# Patient Record
Sex: Female | Born: 1965 | Race: White | Hispanic: No | Marital: Single | Smoking: Never smoker
Health system: Southern US, Community
[De-identification: ages and names within clinical notes are randomized; demographics above are authoritative.]

## PROBLEM LIST (undated history)

## (undated) DIAGNOSIS — M7580 Other shoulder lesions, unspecified shoulder: Secondary | ICD-10-CM

## (undated) DIAGNOSIS — E039 Hypothyroidism, unspecified: Secondary | ICD-10-CM

## (undated) DIAGNOSIS — Z8719 Personal history of other diseases of the digestive system: Secondary | ICD-10-CM

## (undated) DIAGNOSIS — F329 Major depressive disorder, single episode, unspecified: Secondary | ICD-10-CM

## (undated) DIAGNOSIS — F419 Anxiety disorder, unspecified: Secondary | ICD-10-CM

## (undated) DIAGNOSIS — D7582 Heparin induced thrombocytopenia (HIT): Secondary | ICD-10-CM

## (undated) DIAGNOSIS — T4145XA Adverse effect of unspecified anesthetic, initial encounter: Secondary | ICD-10-CM

## (undated) DIAGNOSIS — G8929 Other chronic pain: Secondary | ICD-10-CM

## (undated) DIAGNOSIS — Z8489 Family history of other specified conditions: Secondary | ICD-10-CM

## (undated) DIAGNOSIS — I2699 Other pulmonary embolism without acute cor pulmonale: Secondary | ICD-10-CM

## (undated) DIAGNOSIS — M1990 Unspecified osteoarthritis, unspecified site: Secondary | ICD-10-CM

## (undated) DIAGNOSIS — J189 Pneumonia, unspecified organism: Secondary | ICD-10-CM

## (undated) DIAGNOSIS — C801 Malignant (primary) neoplasm, unspecified: Secondary | ICD-10-CM

## (undated) DIAGNOSIS — G8220 Paraplegia, unspecified: Secondary | ICD-10-CM

## (undated) DIAGNOSIS — M549 Dorsalgia, unspecified: Secondary | ICD-10-CM

## (undated) DIAGNOSIS — Z9289 Personal history of other medical treatment: Secondary | ICD-10-CM

## (undated) DIAGNOSIS — D75829 Heparin-induced thrombocytopenia, unspecified: Secondary | ICD-10-CM

## (undated) DIAGNOSIS — F32A Depression, unspecified: Secondary | ICD-10-CM

## (undated) DIAGNOSIS — E669 Obesity, unspecified: Secondary | ICD-10-CM

## (undated) DIAGNOSIS — M5030 Other cervical disc degeneration, unspecified cervical region: Secondary | ICD-10-CM

## (undated) DIAGNOSIS — I10 Essential (primary) hypertension: Secondary | ICD-10-CM

## (undated) DIAGNOSIS — T8859XA Other complications of anesthesia, initial encounter: Secondary | ICD-10-CM

## (undated) DIAGNOSIS — K219 Gastro-esophageal reflux disease without esophagitis: Secondary | ICD-10-CM

## (undated) HISTORY — PX: TONSILLECTOMY: SUR1361

## (undated) HISTORY — DX: Malignant (primary) neoplasm, unspecified: C80.1

## (undated) HISTORY — DX: Paraplegia, unspecified: G82.20

---

## 2002-09-16 ENCOUNTER — Other Ambulatory Visit: Admission: RE | Admit: 2002-09-16 | Discharge: 2002-09-16 | Payer: Self-pay | Admitting: Obstetrics and Gynecology

## 2006-04-15 ENCOUNTER — Emergency Department (HOSPITAL_COMMUNITY): Admission: EM | Admit: 2006-04-15 | Discharge: 2006-04-15 | Payer: Self-pay | Admitting: Emergency Medicine

## 2006-04-20 ENCOUNTER — Ambulatory Visit: Payer: Self-pay | Admitting: Family Medicine

## 2006-05-04 ENCOUNTER — Ambulatory Visit: Payer: Self-pay | Admitting: Family Medicine

## 2009-05-15 ENCOUNTER — Emergency Department (HOSPITAL_BASED_OUTPATIENT_CLINIC_OR_DEPARTMENT_OTHER): Admission: EM | Admit: 2009-05-15 | Discharge: 2009-05-15 | Payer: Self-pay | Admitting: Emergency Medicine

## 2010-09-09 ENCOUNTER — Emergency Department (INDEPENDENT_AMBULATORY_CARE_PROVIDER_SITE_OTHER): Payer: Self-pay

## 2010-09-09 ENCOUNTER — Emergency Department (HOSPITAL_BASED_OUTPATIENT_CLINIC_OR_DEPARTMENT_OTHER)
Admission: EM | Admit: 2010-09-09 | Discharge: 2010-09-09 | Disposition: A | Discharge status: Home / Self Care | Payer: Self-pay | Attending: Emergency Medicine | Admitting: Emergency Medicine

## 2010-09-09 DIAGNOSIS — M546 Pain in thoracic spine: Secondary | ICD-10-CM

## 2010-09-09 DIAGNOSIS — M545 Low back pain, unspecified: Secondary | ICD-10-CM | POA: Insufficient documentation

## 2010-09-09 DIAGNOSIS — I10 Essential (primary) hypertension: Secondary | ICD-10-CM | POA: Insufficient documentation

## 2010-10-02 ENCOUNTER — Encounter: Payer: Self-pay | Admitting: *Deleted

## 2010-10-02 ENCOUNTER — Emergency Department (HOSPITAL_COMMUNITY): Payer: Self-pay

## 2010-10-02 ENCOUNTER — Emergency Department (HOSPITAL_COMMUNITY)
Admission: EM | Admit: 2010-10-02 | Discharge: 2010-10-02 | Disposition: A | Discharge status: Home / Self Care | Payer: Self-pay | Attending: Emergency Medicine | Admitting: Emergency Medicine

## 2010-10-02 DIAGNOSIS — S9000XA Contusion of unspecified ankle, initial encounter: Secondary | ICD-10-CM | POA: Insufficient documentation

## 2010-10-02 DIAGNOSIS — X500XXA Overexertion from strenuous movement or load, initial encounter: Secondary | ICD-10-CM | POA: Insufficient documentation

## 2010-10-02 DIAGNOSIS — I10 Essential (primary) hypertension: Secondary | ICD-10-CM | POA: Insufficient documentation

## 2010-10-02 DIAGNOSIS — Y9289 Other specified places as the place of occurrence of the external cause: Secondary | ICD-10-CM | POA: Insufficient documentation

## 2010-10-02 DIAGNOSIS — S93409A Sprain of unspecified ligament of unspecified ankle, initial encounter: Secondary | ICD-10-CM | POA: Insufficient documentation

## 2010-10-02 HISTORY — DX: Essential (primary) hypertension: I10

## 2010-10-02 NOTE — ED Notes (Signed)
Rt foot pain since yesterday. Pt states that she twisted her foot yesterday.

## 2010-10-02 NOTE — ED Provider Notes (Signed)
History     Chief Complaint  Patient presents with  . Ankle Pain   [name] a 45 y.o. female presenting with ankle pain. The history is provided by the patient. No language interpreter was used.  Ankle Pain  The incident occurred yesterday. The incident occurred at the pool. Injury mechanism: twisting. The pain is present in the right foot and right ankle. Quality: painful. The pain is moderate. The pain has been constant since onset. Pertinent negatives include no numbness, no loss of motion, no muscle weakness, no loss of sensation and no tingling. She reports no foreign bodies present. The symptoms are aggravated by bearing weight. She has tried nothing for the symptoms.  Patient reports twisting right ankle yesterday while at pool. C/o pain to medial aspect of right foot, anterior ankle and lateral ankle. Unable to walk on right foot without pain. Denies tingling/numbness in RLE. No other complaints.   Patient seen at  12:36 PM  Past Medical History  Diagnosis Date  . Hypertension     Past Surgical History  Procedure Date  . Tonsillectomy     Family History  Problem Relation Age of Onset  . Hypertension Mother   . Diabetes Mother   . Hypertension Father   . Stroke Father     History  Substance Use Topics  . Smoking status: Never Smoker   . Smokeless tobacco: Not on file  . Alcohol Use: No    OB History    Grav Para Term Preterm Abortions TAB SAB Ect Mult Living                  Review of Systems  Musculoskeletal:       Right ankle and foot pain.   Skin:       Bruising.  Neurological: Negative for tingling and numbness.  All other systems reviewed and are negative.    Physical Exam  BP 156/97  Pulse 94  Temp(Src) 98.9 F (37.2 C) (Oral)  Resp 20  Ht 5\' 11"  (1.803 m)  Wt 275 lb (124.739 kg)  BMI 38.35 kg/m2  SpO2 100%  LMP 09/23/2010  Physical Exam  Nursing note and vitals reviewed. Constitutional: She is oriented to person, place, and time.  She appears well-developed and well-nourished.       Hypertensive.  HENT:  Head: Normocephalic and atraumatic.  Eyes: Conjunctivae are normal.  Neck: Neck supple.  Cardiovascular: Normal rate, regular rhythm and normal pulses.   Pulmonary/Chest: Effort normal.  Musculoskeletal: Normal range of motion. She exhibits tenderness.       Slight ecchymosis to medial aspect of right foot. Tenderness to medial aspect of right foot, anterior and lateral right ankle. Distal neurovascular intact in RLE.   Neurological: She is alert and oriented to person, place, and time. No sensory deficit.  Skin: Skin is warm and dry.  Psychiatric: She has a normal mood and affect. Her behavior is normal.   ED Course  Procedures  MDM   I performed a history and physical examination of Sarah [name] and discussed her management with Dr. .  I agree with the history, physical, assessment, and plan of care, with the following exceptions: None  I was present for the following procedures: None Time Spent in Critical Care of the patient: None Time spent in discussions with the patient and family:   Sarah [name]  I personally performed the services described in this documentation, which was scribed in my presence. The recorded information has been reviewed  and considered.    Nat Christen, MD 10/02/10 1254

## 2010-12-09 ENCOUNTER — Encounter (HOSPITAL_BASED_OUTPATIENT_CLINIC_OR_DEPARTMENT_OTHER): Payer: Self-pay | Admitting: Family Medicine

## 2010-12-09 ENCOUNTER — Emergency Department (HOSPITAL_BASED_OUTPATIENT_CLINIC_OR_DEPARTMENT_OTHER)
Admission: EM | Admit: 2010-12-09 | Discharge: 2010-12-09 | Disposition: A | Discharge status: Home / Self Care | Payer: Self-pay | Attending: Emergency Medicine | Admitting: Emergency Medicine

## 2010-12-09 ENCOUNTER — Emergency Department (INDEPENDENT_AMBULATORY_CARE_PROVIDER_SITE_OTHER): Payer: Self-pay

## 2010-12-09 DIAGNOSIS — M7730 Calcaneal spur, unspecified foot: Secondary | ICD-10-CM

## 2010-12-09 DIAGNOSIS — IMO0002 Reserved for concepts with insufficient information to code with codable children: Secondary | ICD-10-CM

## 2010-12-09 DIAGNOSIS — R609 Edema, unspecified: Secondary | ICD-10-CM

## 2010-12-09 DIAGNOSIS — M76899 Other specified enthesopathies of unspecified lower limb, excluding foot: Secondary | ICD-10-CM

## 2010-12-09 DIAGNOSIS — S93409A Sprain of unspecified ligament of unspecified ankle, initial encounter: Secondary | ICD-10-CM

## 2010-12-09 DIAGNOSIS — S93699A Other sprain of unspecified foot, initial encounter: Secondary | ICD-10-CM | POA: Insufficient documentation

## 2010-12-09 DIAGNOSIS — M25579 Pain in unspecified ankle and joints of unspecified foot: Secondary | ICD-10-CM

## 2010-12-09 DIAGNOSIS — X58XXXA Exposure to other specified factors, initial encounter: Secondary | ICD-10-CM

## 2010-12-09 DIAGNOSIS — X500XXA Overexertion from strenuous movement or load, initial encounter: Secondary | ICD-10-CM | POA: Insufficient documentation

## 2010-12-09 DIAGNOSIS — M779 Enthesopathy, unspecified: Secondary | ICD-10-CM

## 2010-12-09 DIAGNOSIS — I10 Essential (primary) hypertension: Secondary | ICD-10-CM | POA: Insufficient documentation

## 2010-12-09 MED ORDER — HYDROCODONE-ACETAMINOPHEN 5-500 MG PO TABS: 1.0000 | ORAL_TABLET | Freq: Four times a day (QID) | ORAL | Status: AC | PRN

## 2010-12-09 NOTE — ED Provider Notes (Signed)
History     CSN: [id number] Arrival date & time: 12/09/2010  3:16 PM   Chief Complaint  Patient presents with  . Ankle Injury     (Include location/radiation/quality/duration/timing/severity/associated sxs/prior treatment) [name] a 45 y.o. female presenting with foot injury. The history is provided by the patient. No language interpreter was used.  Foot Injury  The incident occurred 2 days ago. [place] The injury mechanism was torsion. The pain is present in the left ankle and left foot. The quality of the pain is described as aching. The pain is moderate. The pain has been constant since onset. Pertinent negatives include no numbness, no inability to bear weight, no loss of motion, no loss of sensation and no tingling. She reports no foreign bodies present. The symptoms are aggravated by bearing weight and activity. She has tried NSAIDs for the symptoms. The treatment provided mild relief.     Past Medical History  Diagnosis Date  . Hypertension      Past Surgical History  Procedure Date  . Tonsillectomy     Family History  Problem Relation Age of Onset  . Hypertension Mother   . Diabetes Mother   . Hypertension Father   . Stroke Father     History  Substance Use Topics  . Smoking status: Never Smoker   . Smokeless tobacco: Not on file  . Alcohol Use: No    OB History    Grav Para Term Preterm Abortions TAB SAB Ect Mult Living                  Review of Systems  Neurological: Negative for tingling and numbness.  All other systems reviewed and are negative.    Allergies  Review of patient's allergies indicates no known allergies.  Home Medications   Current Outpatient Rx  Name Route Sig Dispense Refill  . HYDROCHLOROTHIAZIDE 25 MG PO TABS Oral Take 25 mg by mouth daily.      . IBUPROFEN 200 MG PO TABS Oral Take 400 mg by mouth every 6 (six) hours as needed. pain       Physical Exam    BP 152/91  Pulse 94  Temp(Src)  97.8 F (36.6 C) (Oral)  Resp 16  Ht 5\' 10"  (1.778 m)  Wt 275 lb (124.739 kg)  BMI 39.46 kg/m2  SpO2 100%  LMP 10/23/2010  Physical Exam  Nursing note and vitals reviewed. Constitutional: She is oriented to person, place, and time. She appears well-developed and well-nourished.  HENT:  Head: Normocephalic and atraumatic.  Eyes: Pupils are equal, round, and reactive to light.  Cardiovascular: Normal rate and regular rhythm.   Pulmonary/Chest: Effort normal and breath sounds normal.  Musculoskeletal:       Swelling noted to the lateral aspect of the left foot and ankle:pt has full rom  Neurological: She is alert and oriented to person, place, and time. Coordination normal.  Skin: Skin is warm and dry.    ED Course  Procedures  No results found for this or any previous visit. Dg Ankle Complete Left  12/09/2010  *RADIOLOGY REPORT*  Clinical Data: Injured left ankle.  LEFT ANKLE COMPLETE - 3+ VIEW  Comparison: None  Findings: The ankle mortise is maintained.  No acute fracture or osteochondral abnormality.  Multiple densities project off the distal aspects of the tibia and fibula which are likely related to previous injury or enthesopathic changes.  There is a large calcaneal heel spur and calcifications near  the Achilles attachment.  IMPRESSION: Enthesopathic changes around the ankle and foot but no acute fracture.  Original Report Authenticated By: P. Kalman Jewels, M.D.   Dg Foot Complete Left  12/09/2010  *RADIOLOGY REPORT*  Clinical Data: Pain and swelling.  LEFT FOOT - COMPLETE 3+ VIEW  Comparison: None  Findings: The joint spaces are maintained.  No acute fracture. Mild degenerative changes and enthesopathic changes.  IMPRESSION: Enthesopathic changes and midfoot degenerative changes but no acute fracture.  Original Report Authenticated By: P. Kalman Jewels, M.D.      MDM No acute findings noted:pt okay to follow up as needed:pt placed in aso by nursing staff:pt has crutches  at home       Glendell Docker, NP 12/09/10 1630

## 2010-12-09 NOTE — ED Notes (Signed)
Pt sts she "stepped in a hole" and injured left ankle on Saturday. Pt has swelling and pain to left ankle, ambulatory with some difficulty.

## 2010-12-09 NOTE — ED Provider Notes (Signed)
Medical screening examination/treatment/procedure(s) were performed by non-physician practitioner and as supervising physician I was immediately available for consultation/collaboration.   Ezequiel Essex, MD 12/09/10 [phone]

## 2013-01-18 ENCOUNTER — Encounter (HOSPITAL_BASED_OUTPATIENT_CLINIC_OR_DEPARTMENT_OTHER): Payer: Self-pay | Admitting: Emergency Medicine

## 2013-01-18 ENCOUNTER — Emergency Department (HOSPITAL_BASED_OUTPATIENT_CLINIC_OR_DEPARTMENT_OTHER)
Admission: EM | Admit: 2013-01-18 | Discharge: 2013-01-18 | Disposition: A | Discharge status: Home / Self Care | Payer: Medicaid Other | Attending: Emergency Medicine | Admitting: Emergency Medicine

## 2013-01-18 DIAGNOSIS — Z79899 Other long term (current) drug therapy: Secondary | ICD-10-CM | POA: Insufficient documentation

## 2013-01-18 DIAGNOSIS — I10 Essential (primary) hypertension: Secondary | ICD-10-CM | POA: Insufficient documentation

## 2013-01-18 DIAGNOSIS — K089 Disorder of teeth and supporting structures, unspecified: Secondary | ICD-10-CM | POA: Insufficient documentation

## 2013-01-18 DIAGNOSIS — K0889 Other specified disorders of teeth and supporting structures: Secondary | ICD-10-CM

## 2013-01-18 MED ORDER — HYDROCODONE-ACETAMINOPHEN 5-325 MG PO TABS: 2.0000 | ORAL_TABLET | ORAL | Status: DC | PRN

## 2013-01-18 MED ORDER — AMOXICILLIN 500 MG PO CAPS: 500.0000 mg | ORAL_CAPSULE | Freq: Three times a day (TID) | ORAL | Status: DC

## 2013-01-18 NOTE — ED Notes (Signed)
Toothache for a couple days. Unable to see dentist until Monday.

## 2013-01-18 NOTE — ED Provider Notes (Signed)
Medical screening examination/treatment/procedure(s) were performed by non-physician practitioner and as supervising physician I was immediately available for consultation/collaboration.  EKG Interpretation   None         Neta Ehlers, MD 01/18/13 1557

## 2013-01-18 NOTE — ED Provider Notes (Signed)
CSN: [id number]     Arrival date & time 01/18/13  1324 History   First MD Initiated Contact with Patient 01/18/13 1459     Chief Complaint  Patient presents with  . Dental Pain   (Consider location/radiation/quality/duration/timing/severity/associated sxs/prior Treatment) [name] a 47 y.o. female presenting with tooth pain. The history is provided by the patient. No language interpreter was used.  Dental Pain Location:  Upper Quality:  Shooting and aching Severity:  Severe Onset quality:  Gradual Duration:  2 days Timing:  Constant Progression:  Worsening Chronicity:  New Relieved by:  Nothing Worsened by:  Nothing tried Associated symptoms: no congestion     Past Medical History  Diagnosis Date  . Hypertension    Past Surgical History  Procedure Laterality Date  . Tonsillectomy     Family History  Problem Relation Age of Onset  . Hypertension Mother   . Diabetes Mother   . Hypertension Father   . Stroke Father    History  Substance Use Topics  . Smoking status: Never Smoker   . Smokeless tobacco: Not on file  . Alcohol Use: No   OB History   Grav Para Term Preterm Abortions TAB SAB Ect Mult Living                 Review of Systems  HENT: Negative for congestion.   All other systems reviewed and are negative.    Allergies  Review of patient's allergies indicates no known allergies.  Home Medications   Current Outpatient Rx  Name  Route  Sig  Dispense  Refill  . hydrochlorothiazide 25 MG tablet   Oral   Take 25 mg by mouth daily.           [name] Kitchen ibuprofen (ADVIL,MOTRIN) 200 MG tablet   Oral   Take 400 mg by mouth every 6 (six) hours as needed. pain           BP 157/84  Pulse 87  Temp(Src) 98.7 F (37.1 C) (Oral)  Resp 18  Ht 5\' 11"  (1.803 m)  Wt 320 lb (145.151 kg)  BMI 44.65 kg/m2  SpO2 100% Physical Exam  Nursing note and vitals reviewed. Constitutional: She is oriented to person, place, and time. She appears well-developed and  well-nourished.  HENT:  Head: Normocephalic.  Right Ear: External ear normal.  Mouth/Throat: Oropharynx is clear and moist.  Tender left maxillary,  No obvious dental abscess  Eyes: EOM are normal.  Neck: Normal range of motion.  Pulmonary/Chest: Effort normal.  Abdominal: She exhibits no distension.  Musculoskeletal: Normal range of motion.  Neurological: She is alert and oriented to person, place, and time.  Skin: Skin is warm.  Psychiatric: She has a normal mood and affect.    ED Course  Procedures (including critical care time) Labs Review Labs Reviewed - No data to display Imaging Review No results found.  EKG Interpretation   None       MDM   1. Toothache    amoxicillian and hydrocodone.   Pt advised to see her dentist for evaluation    Fransico Meadow, PA-C 01/18/13 1526

## 2013-03-24 DIAGNOSIS — I2699 Other pulmonary embolism without acute cor pulmonale: Secondary | ICD-10-CM

## 2013-03-24 HISTORY — DX: Other pulmonary embolism without acute cor pulmonale: I26.99

## 2013-04-06 ENCOUNTER — Ambulatory Visit: Payer: Medicaid Other | Admitting: Radiation Oncology

## 2013-12-04 ENCOUNTER — Emergency Department (HOSPITAL_COMMUNITY)
Admission: EM | Admit: 2013-12-04 | Discharge: 2013-12-04 | Disposition: A | Discharge status: Home / Self Care | Payer: Medicaid Other | Attending: Emergency Medicine | Admitting: Emergency Medicine

## 2013-12-04 ENCOUNTER — Emergency Department (HOSPITAL_COMMUNITY): Payer: Medicaid Other

## 2013-12-04 ENCOUNTER — Encounter (HOSPITAL_COMMUNITY): Payer: Self-pay | Admitting: Emergency Medicine

## 2013-12-04 DIAGNOSIS — I10 Essential (primary) hypertension: Secondary | ICD-10-CM | POA: Diagnosis not present

## 2013-12-04 DIAGNOSIS — G8911 Acute pain due to trauma: Secondary | ICD-10-CM | POA: Diagnosis not present

## 2013-12-04 DIAGNOSIS — M546 Pain in thoracic spine: Secondary | ICD-10-CM | POA: Insufficient documentation

## 2013-12-04 DIAGNOSIS — J9801 Acute bronchospasm: Secondary | ICD-10-CM | POA: Insufficient documentation

## 2013-12-04 DIAGNOSIS — R091 Pleurisy: Secondary | ICD-10-CM

## 2013-12-04 LAB — BASIC METABOLIC PANEL
Anion gap: 12 (ref 5–15)
BUN: 14 mg/dL (ref 6–23)
CO2: 25 meq/L (ref 19–32)
Calcium: 8.8 mg/dL (ref 8.4–10.5)
Chloride: 100 mEq/L (ref 96–112)
Creatinine, Ser: 1.21 mg/dL — ABNORMAL HIGH (ref 0.50–1.10)
GFR calc Af Amer: 61 mL/min — ABNORMAL LOW (ref >90)
GFR calc non Af Amer: 52 mL/min — ABNORMAL LOW (ref >90)
Glucose, Bld: 124 mg/dL — ABNORMAL HIGH (ref 70–99)
POTASSIUM: 3.7 meq/L (ref 3.7–5.3)
Sodium: 137 mEq/L (ref 137–147)

## 2013-12-04 LAB — CBC WITH DIFFERENTIAL/PLATELET
BASOS ABS: 0 10*3/uL (ref 0.0–0.1)
Basophils Relative: 0 % (ref 0–1)
Eosinophils Absolute: 0.2 10*3/uL (ref 0.0–0.7)
Eosinophils Relative: 3 % (ref 0–5)
HCT: 34.6 % — ABNORMAL LOW (ref 36.0–46.0)
HEMOGLOBIN: 11.8 g/dL — AB (ref 12.0–15.0)
Lymphocytes Relative: 35 % (ref 12–46)
Lymphs Abs: 2.7 10*3/uL (ref 0.7–4.0)
MCH: 29.1 pg (ref 26.0–34.0)
MCHC: 34.1 g/dL (ref 30.0–36.0)
MCV: 85.2 fL (ref 78.0–100.0)
MONO ABS: 0.6 10*3/uL (ref 0.1–1.0)
MONOS PCT: 7 % (ref 3–12)
NEUTROS ABS: 4.2 10*3/uL (ref 1.7–7.7)
Neutrophils Relative %: 55 % (ref 43–77)
PLATELETS: 224 10*3/uL (ref 150–400)
RBC: 4.06 MIL/uL (ref 3.87–5.11)
RDW: 14 % (ref 11.5–15.5)
WBC: 7.7 10*3/uL (ref 4.0–10.5)

## 2013-12-04 LAB — D-DIMER, QUANTITATIVE (NOT AT ARMC)

## 2013-12-04 LAB — TROPONIN I

## 2013-12-04 MED ORDER — ALBUTEROL SULFATE HFA 108 (90 BASE) MCG/ACT IN AERS
2.0000 | INHALATION_SPRAY | RESPIRATORY_TRACT | Status: DC | PRN
  Administered 2013-12-04: 2 via RESPIRATORY_TRACT
  Filled 2013-12-04: qty 6.7

## 2013-12-04 MED ORDER — PREDNISONE 50 MG PO TABS
60.0000 mg | ORAL_TABLET | Freq: Once | ORAL | Status: AC
  Administered 2013-12-04: 60 mg via ORAL
  Filled 2013-12-04 (×2): qty 1

## 2013-12-04 MED ORDER — AZITHROMYCIN 250 MG PO TABS: 250.0000 mg | ORAL_TABLET | Freq: Every day | ORAL | Status: DC

## 2013-12-04 MED ORDER — PREDNISONE 20 MG PO TABS: ORAL_TABLET | ORAL | Status: DC

## 2013-12-04 MED ORDER — HYDROCODONE-HOMATROPINE 5-1.5 MG/5ML PO SYRP: 5.0000 mL | ORAL_SOLUTION | Freq: Four times a day (QID) | ORAL | Status: DC | PRN

## 2013-12-04 NOTE — ED Notes (Signed)
Pt c/o upper back pain x 5 weeks.  Was seen at [facility] when it started and they treated her for muscle strain.  Pt believes she has pleurisy.  Pt has hx of pneumonia and states the pain is similar.  Pt reports constant pain described as burning and nagging.  Pt reports dyspnea with activity.  Pt reports increased pain with coughing and sneezing.  Pain started on right side of upper back but now felt on both sides.  NAD, respirations equal and unlabored currently, skin warm and dry.

## 2013-12-04 NOTE — ED Provider Notes (Signed)
CSN: [id number]     Arrival date & time 12/04/13  1752 History   First MD Initiated Contact with Patient 12/04/13 1808     Chief Complaint  Patient presents with  . Pleurisy     (Consider location/radiation/quality/duration/timing/severity/associated sxs/prior Treatment) HPI Comments: Patient presents to the ER for evaluation of bilateral upper back pain and anterior chest discomfort and tightness. Symptoms have been present for approximately 5 weeks. When the symptoms began she was seen at another ER, told she had a muscle strain. She was treated with Naprosyn and Flexeril without relief. Since then symptoms have worsened. She reports that she feels increased pain when she breathes, shortness of breath. She does have a cough it hurts more when she coughs. She has done some research and thinks she might have pleurisy.   Past Medical History  Diagnosis Date  . Hypertension    Past Surgical History  Procedure Laterality Date  . Tonsillectomy     Family History  Problem Relation Age of Onset  . Hypertension Mother   . Diabetes Mother   . Hypertension Father   . Stroke Father    History  Substance Use Topics  . Smoking status: Never Smoker   . Smokeless tobacco: Not on file  . Alcohol Use: No   OB History   Grav Para Term Preterm Abortions TAB SAB Ect Mult Living                 Review of Systems  Respiratory: Positive for cough.   Cardiovascular: Positive for chest pain.  Musculoskeletal: Positive for back pain.  All other systems reviewed and are negative.     Allergies  Tramadol  Home Medications   Prior to Admission medications   Medication Sig Start Date End Date Taking? Authorizing Provider  ibuprofen (ADVIL,MOTRIN) 200 MG tablet Take 400 mg by mouth every 6 (six) hours as needed. pain     Historical Provider, MD   BP 159/93  Pulse 98  Temp(Src) 98 F (36.7 C) (Oral)  Resp 18  Ht 5\' 10"  (1.778 m)  Wt 320 lb (145.151 kg)  BMI 45.92 kg/m2  SpO2 100%   LMP 11/28/2013 Physical Exam  Constitutional: She is oriented to person, place, and time. She appears well-developed and well-nourished. No distress.  HENT:  Head: Normocephalic and atraumatic.  Right Ear: Hearing normal.  Left Ear: Hearing normal.  Nose: Nose normal.  Mouth/Throat: Oropharynx is clear and moist and mucous membranes are normal.  Eyes: Conjunctivae and EOM are normal. Pupils are equal, round, and reactive to light.  Neck: Normal range of motion. Neck supple.  Cardiovascular: Regular rhythm, S1 normal and S2 normal.  Exam reveals no gallop and no friction rub.   No murmur heard. Pulmonary/Chest: Effort normal and breath sounds normal. No respiratory distress. She exhibits no tenderness.  Abdominal: Soft. Normal appearance and bowel sounds are normal. There is no hepatosplenomegaly. There is no tenderness. There is no rebound, no guarding, no tenderness at McBurney's point and negative Murphy's sign. No hernia.  Musculoskeletal: Normal range of motion.  Neurological: She is alert and oriented to person, place, and time. She has normal strength. No cranial nerve deficit or sensory deficit. Coordination normal. GCS eye subscore is 4. GCS verbal subscore is 5. GCS motor subscore is 6.  Skin: Skin is warm, dry and intact. No rash noted. No cyanosis.  Psychiatric: She has a normal mood and affect. Her speech is normal and behavior is normal. Thought content normal.  ED Course  Procedures (including critical care time) Labs Review Labs Reviewed  CBC WITH DIFFERENTIAL - Abnormal; Notable for the following:    Hemoglobin 11.8 (*)    HCT 34.6 (*)    All other components within normal limits  BASIC METABOLIC PANEL - Abnormal; Notable for the following:    Glucose, Bld 124 (*)    Creatinine, Ser 1.21 (*)    GFR calc non Af Amer 52 (*)    GFR calc Af Amer 61 (*)    All other components within normal limits  TROPONIN I  D-DIMER, QUANTITATIVE    Imaging Review Dg Chest 2  View  12/04/2013   CLINICAL DATA:  Upper back pain for 5 weeks. Dyspnea on exertion. Nonsmoker. History of hypertension.  EXAM: CHEST  2 VIEW  COMPARISON:  04/15/2006  FINDINGS: The heart is mildly enlarged. There is elevation of right hemidiaphragm, stable in appearance. There is streaky density favored to be within the right lower lobe, consistent with atelectasis or infiltrate. No pulmonary edema. No pleural effusions. There are moderate degenerative changes in the thoracic spine.  IMPRESSION: Right lower lobe atelectasis or infiltrate.   Electronically Signed   By: Shon Hale M.D.   On: 12/04/2013 19:26     EKG Interpretation   Date/Time:  Sunday December 04 2013 18:20:21 EDT Ventricular Rate:  97 PR Interval:  165 QRS Duration: 103 QT Interval:  378 QTC Calculation: 480 R Axis:   45 Text Interpretation:  Sinus rhythm Low voltage, precordial leads Otherwise  within normal limits Confirmed by Amisha Pospisil  MD, Yerania Chamorro [phone]) on  12/04/2013 6:22:42 PM      MDM   Final diagnoses:  None   pleurisy  Possible community-acquired pneumonia  Patient presented to the ER with complaints of pain across her upper back and across her chest for 5 weeks. She reports that there is a tightness which he grades. She has a cough which is nonproductive, this causes the pain to worsen. Patient's lungs are clear to auscultation. Cardiac workup was negative. She has a normal EKG. Troponin is negative. Patient also has a negative d-dimer. As her symptoms have been present for approximately 5 weeks, PE is felt to be extremely low likelihood.  Patient will be initiated on prednisone and albuterol. Her chest x-ray does show right lower lobe atelectasis or infiltrate. This to her symptoms, will initiate Zithromax in addition to cover for possible pneumonia.   Orpah Greek, MD 12/04/13 2006

## 2013-12-04 NOTE — Discharge Instructions (Signed)
Pleurisy  Pleurisy is an inflammation and swelling of the lining of the lungs (pleura). Because of this inflammation, it hurts to breathe. It can be aggravated by coughing, laughing, or deep breathing. Pleurisy is often caused by an underlying infection or disease.   HOME CARE INSTRUCTIONS   Monitor your pleurisy for any changes. The following actions may help to alleviate any discomfort you are experiencing:   Medicine may help with pain. Only take over-the-counter or prescription medicines for pain, discomfort, or fever as directed by your health care provider.   Only take antibiotic medicine as directed. Make sure to finish it even if you start to feel better.  SEEK MEDICAL CARE IF:    Your pain is not controlled with medicine or is increasing.   You have an increase in pus-like (purulent) secretions brought up with coughing.  SEEK IMMEDIATE MEDICAL CARE IF:    You have blue or dark lips, fingernails, or toenails.   You are coughing up blood.   You have increased difficulty breathing.   You have continuing pain unrelieved by medicine or pain lasting more than 1 week.   You have pain that radiates into your neck, arms, or jaw.   You develop increased shortness of breath or wheezing.   You develop a fever, rash, vomiting, fainting, or other serious symptoms.  MAKE SURE YOU:   Understand these instructions.    Will watch your condition.    Will get help right away if you are not doing well or get worse.    Document Released: 03/10/2005 Document Revised: 11/10/2012 Document Reviewed: 08/22/2012  [employer] Patient Information 2015 [employer]. This information is not intended to replace advice given to you by your health care provider. Make sure you discuss any questions you have with your health care provider.

## 2013-12-04 NOTE — ED Notes (Signed)
Pt with pain to upper back for 5 weeks, seen at [facility] for it and was prescribed flexeril and naproxen without relief

## 2013-12-09 ENCOUNTER — Encounter (HOSPITAL_COMMUNITY): Payer: Self-pay | Admitting: Emergency Medicine

## 2013-12-09 ENCOUNTER — Observation Stay (HOSPITAL_COMMUNITY)
Admission: EM | Admit: 2013-12-09 | Discharge: 2013-12-12 | Disposition: A | Discharge status: Home / Self Care | Payer: Medicaid Other | Attending: Family Medicine | Admitting: Family Medicine

## 2013-12-09 ENCOUNTER — Emergency Department (HOSPITAL_COMMUNITY)
Admission: EM | Admit: 2013-12-09 | Discharge: 2013-12-09 | Disposition: A | Discharge status: Home / Self Care | Payer: Medicaid Other | Source: Home / Self Care | Attending: Emergency Medicine | Admitting: Emergency Medicine

## 2013-12-09 ENCOUNTER — Emergency Department (HOSPITAL_COMMUNITY): Payer: Medicaid Other

## 2013-12-09 DIAGNOSIS — I10 Essential (primary) hypertension: Secondary | ICD-10-CM | POA: Diagnosis not present

## 2013-12-09 DIAGNOSIS — R091 Pleurisy: Secondary | ICD-10-CM | POA: Diagnosis not present

## 2013-12-09 DIAGNOSIS — M5030 Other cervical disc degeneration, unspecified cervical region: Secondary | ICD-10-CM | POA: Diagnosis not present

## 2013-12-09 DIAGNOSIS — M546 Pain in thoracic spine: Secondary | ICD-10-CM | POA: Insufficient documentation

## 2013-12-09 DIAGNOSIS — M47814 Spondylosis without myelopathy or radiculopathy, thoracic region: Secondary | ICD-10-CM

## 2013-12-09 DIAGNOSIS — Z792 Long term (current) use of antibiotics: Secondary | ICD-10-CM

## 2013-12-09 DIAGNOSIS — IMO0002 Reserved for concepts with insufficient information to code with codable children: Secondary | ICD-10-CM

## 2013-12-09 DIAGNOSIS — M2550 Pain in unspecified joint: Secondary | ICD-10-CM

## 2013-12-09 DIAGNOSIS — M549 Dorsalgia, unspecified: Secondary | ICD-10-CM | POA: Diagnosis present

## 2013-12-09 DIAGNOSIS — R29898 Other symptoms and signs involving the musculoskeletal system: Principal | ICD-10-CM | POA: Insufficient documentation

## 2013-12-09 DIAGNOSIS — M545 Low back pain, unspecified: Secondary | ICD-10-CM | POA: Insufficient documentation

## 2013-12-09 DIAGNOSIS — M5136 Other intervertebral disc degeneration, lumbar region: Secondary | ICD-10-CM

## 2013-12-09 DIAGNOSIS — M5459 Other low back pain: Secondary | ICD-10-CM

## 2013-12-09 DIAGNOSIS — Z79899 Other long term (current) drug therapy: Secondary | ICD-10-CM

## 2013-12-09 DIAGNOSIS — E669 Obesity, unspecified: Secondary | ICD-10-CM | POA: Diagnosis not present

## 2013-12-09 DIAGNOSIS — Z791 Long term (current) use of non-steroidal anti-inflammatories (NSAID): Secondary | ICD-10-CM

## 2013-12-09 DIAGNOSIS — Z9889 Other specified postprocedural states: Secondary | ICD-10-CM | POA: Insufficient documentation

## 2013-12-09 DIAGNOSIS — J449 Chronic obstructive pulmonary disease, unspecified: Secondary | ICD-10-CM | POA: Insufficient documentation

## 2013-12-09 DIAGNOSIS — J4489 Other specified chronic obstructive pulmonary disease: Secondary | ICD-10-CM | POA: Insufficient documentation

## 2013-12-09 DIAGNOSIS — G8929 Other chronic pain: Secondary | ICD-10-CM | POA: Insufficient documentation

## 2013-12-09 DIAGNOSIS — R2689 Other abnormalities of gait and mobility: Secondary | ICD-10-CM

## 2013-12-09 HISTORY — DX: Obesity, unspecified: E66.9

## 2013-12-09 HISTORY — DX: Other chronic pain: G89.29

## 2013-12-09 HISTORY — DX: Other cervical disc degeneration, unspecified cervical region: M50.30

## 2013-12-09 HISTORY — DX: Dorsalgia, unspecified: M54.9

## 2013-12-09 HISTORY — DX: Unspecified osteoarthritis, unspecified site: M19.90

## 2013-12-09 MED ORDER — DIAZEPAM 5 MG PO TABS
10.0000 mg | ORAL_TABLET | Freq: Once | ORAL | Status: AC
  Administered 2013-12-09: 10 mg via ORAL
  Filled 2013-12-09: qty 2

## 2013-12-09 MED ORDER — ONDANSETRON HCL 4 MG PO TABS
4.0000 mg | ORAL_TABLET | Freq: Once | ORAL | Status: AC
  Administered 2013-12-09: 4 mg via ORAL
  Filled 2013-12-09: qty 1

## 2013-12-09 MED ORDER — HYDROCODONE-ACETAMINOPHEN 5-325 MG PO TABS
2.0000 | ORAL_TABLET | Freq: Once | ORAL | Status: AC
  Administered 2013-12-09: 2 via ORAL
  Filled 2013-12-09: qty 2

## 2013-12-09 MED ORDER — KETOROLAC TROMETHAMINE 10 MG PO TABS
10.0000 mg | ORAL_TABLET | Freq: Once | ORAL | Status: AC
  Administered 2013-12-09: 10 mg via ORAL
  Filled 2013-12-09: qty 1

## 2013-12-09 MED ORDER — DIAZEPAM 5 MG PO TABS: ORAL_TABLET | ORAL | Status: DC

## 2013-12-09 MED ORDER — HYDROCODONE-ACETAMINOPHEN 5-325 MG PO TABS: 1.0000 | ORAL_TABLET | ORAL | Status: DC | PRN

## 2013-12-09 MED ORDER — DICLOFENAC SODIUM 75 MG PO TBEC: 75.0000 mg | DELAYED_RELEASE_TABLET | Freq: Two times a day (BID) | ORAL | Status: DC

## 2013-12-09 NOTE — ED Notes (Signed)
Seen here recently with pleurisy, Since then has developed tingling /numbness in legs. And says she is having difficulty walking, with unsteadiness. Alert.

## 2013-12-09 NOTE — Discharge Instructions (Signed)
And previous x-rays reveals arthritis involving the thoracic spine, and this may very well explain some of the discomfort between your shoulder blades and in her upper back. The CT scan done today of your lumbar spine reveals arthritis throughout the lumbar spine, as well as degenerative disc disease at the  area, L3-L4 area, L4-L5, and L5-S1 areas. It is important that you are evaluated by an orthopedic specialist or neurosurgery specialist as sone as possible.  Please use diclofenac and Valium daily. Use Norco if needed for pain. Norco and Valium may cause drowsiness, please do not drink, drive, operate machinery, or participate in activities that require concentration while taking these medications. Heat to your upper and lower back maybe helpful. Degenerative Disk Disease Degenerative disk disease is a condition caused by the changes that occur in the cushions of the backbone (spinal disks) as you grow older. Spinal disks are soft and compressible disks located between the bones of the spine (vertebrae). They act like shock absorbers. Degenerative disk disease can affect the whole spine. However, the neck and lower back are most commonly affected. Many changes can occur in the spinal disks with aging, such as:  The spinal disks may dry and shrink.  Small tears may occur in the tough, outer covering of the disk (annulus).  The disk space may become smaller due to loss of water.  Abnormal growths in the bone (spurs) may occur. This can put pressure on the nerve roots exiting the spinal canal, causing pain.  The spinal canal may become narrowed. CAUSES  Degenerative disk disease is a condition caused by the changes that occur in the spinal disks with aging. The exact cause is not known, but there is a genetic basis for many patients. Degenerative changes can occur due to loss of fluid in the disk. This makes the disk thinner and reduces the space between the backbones. Small cracks can develop in  the outer layer of the disk. This can lead to the breakdown of the disk. You are more likely to get degenerative disk disease if you are overweight. Smoking cigarettes and doing heavy work such as weightlifting can also increase your risk of this condition. Degenerative changes can start after a sudden injury. Growth of bone spurs can compress the nerve roots and cause pain.  SYMPTOMS  The symptoms vary from person to person. Some people may have no pain, while others have severe pain. The pain may be so severe that it can limit your activities. The location of the pain depends on the part of your backbone that is affected. You will have neck or arm pain if a disk in the neck area is affected. You will have pain in your back, buttocks, or legs if a disk in the lower back is affected. The pain becomes worse while bending, reaching up, or with twisting movements. The pain may start gradually and then get worse as time passes. It may also start after a major or minor injury. You may feel numbness or tingling in the arms or legs.  DIAGNOSIS  Your caregiver will ask you about your symptoms and about activities or habits that may cause the pain. He or she may also ask about any injuries, diseases, or treatments you have had earlier. Your caregiver will examine you to check for the range of movement that is possible in the affected area, to check for strength in your extremities, and to check for sensation in the areas of the arms and legs supplied by  different nerve roots. An X-ray of the spine may be taken. Your caregiver may suggest other imaging tests, such as magnetic resonance imaging (MRI), if needed.  TREATMENT  Treatment includes rest, modifying your activities, and applying ice and heat. Your caregiver may prescribe medicines to reduce your pain and may ask you to do some exercises to strengthen your back. In some cases, you may need surgery. You and your caregiver will decide on the treatment that is best  for you. HOME CARE INSTRUCTIONS   Follow proper lifting and walking techniques as advised by your caregiver.  Maintain good posture.  Exercise regularly as advised.  Perform relaxation exercises.  Change your sitting, standing, and sleeping habits as advised. Change positions frequently.  Lose weight as advised.  Stop smoking if you smoke.  Wear supportive footwear. SEEK MEDICAL CARE IF:  Your pain does not go away within 1 to 4 weeks. SEEK IMMEDIATE MEDICAL CARE IF:   Your pain is severe.  You notice weakness in your arms, hands, or legs.  You begin to lose control of your bladder or bowel movements. MAKE SURE YOU:   Understand these instructions.  Will watch your condition.  Will get help right away if you are not doing well or get worse. Document Released: 01/05/2007 Document Revised: 06/02/2011 Document Reviewed: 07/12/2013 [facility] Patient Information 2015 [place], [place]. This information is not intended to replace advice given to you by your health care provider. Make sure you discuss any questions you have with your health care provider.

## 2013-12-09 NOTE — ED Notes (Addendum)
Pt. arrived with EMS from home reports chronic mid/low back pain " my legs gave out" , seen at [facility] ER this afternoon for the same complaint - lumbar CT scan done , received Valium , Toradol , Vicodin and Zofran at [facility] , discharged home with prescription Valium , Vicodin and Voltaren but did not take prescriptions , denies incontinence or urinary discomfort .

## 2013-12-09 NOTE — ED Notes (Addendum)
Pt reports seen for same at several different hospitals over last several months. Pt reports was diagnosed with pulled muscle at [place] last night. Pt reports BLE intermittent tingling and weakness since tuesday. nad noted. Pt denies any new or recent injury.

## 2013-12-09 NOTE — ED Provider Notes (Signed)
CSN: [id number]     Arrival date & time 12/09/13  1149 History   First MD Initiated Contact with Patient 12/09/13 1229     Chief Complaint  Patient presents with  . Back Pain     (Consider location/radiation/quality/duration/timing/severity/associated sxs/prior Treatment) HPI Comments: [name] a 48 year old female who presents to the emergency department with complaint of upper and lower back pain. The patient states that she's been having this problem for some time. She has been to different hospital emergency departments for evaluation. The patient was diagnosed most recently with a pulled muscle. She was placed on medication, but states the medication is not helping. [name] also concerned that over the past 2 days she has been walking with a very unsteady balance and she says that everybody tells her that she walks funny now. She says at times she can feel things are well in her legs and she presents to the emergency department for additional evaluation. She's not had any injury or trauma to the upper back or the lower back. She's not had any operations or procedures on the upper or lower back. She's not had any loss of control of bowels or bladder. The been no recent upper respiratory infections.  [name] a 48 y.o. female presenting with back pain. The history is provided by the patient.  Back Pain Associated symptoms: no abdominal pain, no chest pain and no dysuria     Past Medical History  Diagnosis Date  . Hypertension    Past Surgical History  Procedure Laterality Date  . Tonsillectomy     Family History  Problem Relation Age of Onset  . Hypertension Mother   . Diabetes Mother   . Hypertension Father   . Stroke Father    History  Substance Use Topics  . Smoking status: Never Smoker   . Smokeless tobacco: Not on file  . Alcohol Use: No   OB History   Grav Para Term Preterm Abortions TAB SAB Ect Mult Living                 Review of Systems  Constitutional:  Negative for activity change.       All ROS Neg except as noted in HPI  Eyes: Negative for photophobia and discharge.  Respiratory: Negative for cough, shortness of breath and wheezing.   Cardiovascular: Negative for chest pain and palpitations.  Gastrointestinal: Negative for abdominal pain and blood in stool.  Genitourinary: Negative for dysuria, frequency and hematuria.  Musculoskeletal: Positive for arthralgias and back pain. Negative for neck pain.  Skin: Negative.   Neurological: Negative for dizziness, seizures and speech difficulty.  Psychiatric/Behavioral: Negative.       Allergies  Tramadol  Home Medications   Prior to Admission medications   Medication Sig Start Date End Date Taking? Authorizing Provider  albuterol (PROVENTIL HFA;VENTOLIN HFA) 108 (90 BASE) MCG/ACT inhaler Inhale 2 puffs into the lungs every 6 (six) hours as needed for wheezing or shortness of breath.   Yes Historical Provider, MD  azithromycin (ZITHROMAX) 250 MG tablet Take 1 tablet (250 mg total) by mouth daily. Take first 2 tablets together, then 1 every day until finished. 12/04/13  Yes Orpah Greek, MD  ibuprofen (ADVIL,MOTRIN) 200 MG tablet Take 600 mg by mouth every 6 (six) hours as needed. pain   Yes Historical Provider, MD  predniSONE (DELTASONE) 20 MG tablet 3 tabs po daily x 3 days, then 2 tabs x 3 days, then 1.5 tabs x 3 days,  then 1 tab x 3 days, then 0.5 tabs x 3 days 12/04/13  Yes Orpah Greek, MD  diazepam (VALIUM) 5 MG tablet 1 po tid for spasm pain 12/09/13   Lenox Ahr, PA-C  diclofenac (VOLTAREN) 75 MG EC tablet Take 1 tablet (75 mg total) by mouth 2 (two) times daily. 12/09/13   Lenox Ahr, PA-C  HYDROcodone-acetaminophen (NORCO/VICODIN) 5-325 MG per tablet Take 1 tablet by mouth every 4 (four) hours as needed. 12/09/13   Lenox Ahr, PA-C  HYDROcodone-homatropine (HYCODAN) 5-1.5 MG/5ML syrup Take 5 mLs by mouth every 6 (six) hours as needed for cough. 12/04/13    Orpah Greek, MD   BP 180/82  Pulse 92  Temp(Src) 98.1 F (36.7 C) (Oral)  Resp 18  Ht 5\' 11"  (1.803 m)  Wt 315 lb (142.883 kg)  BMI 43.95 kg/m2  SpO2 100%  LMP 11/28/2013 Physical Exam  Nursing note and vitals reviewed. Constitutional: She is oriented to person, place, and time. She appears well-developed and well-nourished.  Non-toxic appearance.  [name] morbidly obese  HENT:  Head: Normocephalic.  Right Ear: Tympanic membrane and external ear normal.  Left Ear: Tympanic membrane and external ear normal.  Eyes: EOM and lids are normal. Pupils are equal, round, and reactive to light.  Neck: Normal range of motion. Neck supple. Carotid bruit is not present.  Cardiovascular: Normal rate, regular rhythm, normal heart sounds, intact distal pulses and normal pulses.   Pulmonary/Chest: Breath sounds normal. No respiratory distress.  Abdominal: Soft. Bowel sounds are normal. There is no tenderness. There is no guarding.  Musculoskeletal:       Thoracic back: She exhibits tenderness, pain and spasm.       Lumbar back: She exhibits decreased range of motion, tenderness, pain and spasm. She exhibits no deformity.  Lymphadenopathy:       Head (right side): No submandibular adenopathy present.       Head (left side): No submandibular adenopathy present.    She has no cervical adenopathy.  Neurological: She is alert and oriented to person, place, and time. She has normal strength. No cranial nerve deficit or sensory deficit.  Skin: Skin is warm and dry.  Psychiatric: She has a normal mood and affect. Her speech is normal.    ED Course  Procedures (including critical care time) Labs Review Labs Reviewed - No data to display  Imaging Review Ct Lumbar Spine Wo Contrast  12/09/2013   CLINICAL DATA:  Low back pain with difficulty walking.  EXAM: CT LUMBAR SPINE WITHOUT CONTRAST  TECHNIQUE: Multidetector CT imaging of the lumbar spine was performed without intravenous  contrast administration. Multiplanar CT image reconstructions were also generated.  COMPARISON:  None.  FINDINGS: There is no fracture or spondylolisthesis. There is moderately severe disc space narrowing at L5-S1 with slight vacuum phenomenon at this level. There is moderate narrowing at L4-5. There are prominent anterior osteophytes at L2, L3, L4, and L5.  At T12-L1, there is moderate facet hypertrophy bilaterally. There is no nerve root edema or effacement. No disc extrusion or stenosis.  At L1-2, there is moderate facet hypertrophy bilaterally. There is mild diffuse disc bulging. There is no nerve root edema or effacement. No disc extrusion or stenosis.  At L2-3, there is broad-based disc bulging and moderate facet hypertrophy bilaterally. There is borderline narrowing of the thecal sac with borderline spinal stenosis. No disc extrusion.  At L3-4, there is severe facet osteoarthritic change bilaterally, more pronounced on the  left than on the right. There is broad-based disc bulging and moderate ligamentum flavum hypertrophy. These findings in concert lead to moderate generalized spinal stenosis.  At L4-5, there is broad-based disc protrusion and severe facet osteoarthritic change bilaterally. These findings in concert lead to moderate generalized spinal stenosis. No frank disc extrusion.  At L5-S1, there is severe facet osteoarthritic change on the left and moderate facet osteoarthritic change on the right. There is broad-based disc protrusion. There is exit foraminal narrowing bilaterally, more severe on the left than on the right, due to osteophytic change. No frank disc extrusion or stenosis is seen at this level.  There are no paraspinous lesions.  IMPRESSION: Spinal stenosis at L3-4 and L4-5, multifactorial. Borderline spinal stenosis at L2-3. 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  Multilevel arthropathy.  No frank disc extrusion.  No  fracture or spondylolisthesis.   Electronically Signed   By: Lowella Grip M.D.   On: 12/09/2013 13:50     EKG Interpretation None      MDM  Vital signs are within normal limits with exception of the blood pressure being elevated at 180/82. Pulse oximetry is 100% on room air. The patient was tested and found to be walking with an abnormal gait. There was no foot drop present. There was instability noted when attempting to walk and stand.  I have reviewed previous emergency department visits and many images that have been done in the past. Patient has several documented areas of degenerative joint disease involving the thoracic spine. Feel that the patient's pain between the shoulder blades and the upper thoracic spine is related to the degenerative joint disease are reported.  CT scan of the lumbar spine reveals spinal stenosis at the L3-L4, L4-L5 areas.  These findings were discussed with the patient in terms which he understands. I've advised the patient to see the orthopedic specialist or neurosurgeon was of her choice or of by referral from her primary physician as sone as possible. Patient was given the name of Dr. Alma Friendly who was the orthopedic specialist on-call for today.  Patient was treated with anti-inflammatory, oral muscle relaxer, and oral pain medicines with significant improvement in the pain. [name] to follow with primary physician, or return to the emergency apartment if any changes or problems.    Final diagnoses:  DDD (degenerative disc disease), lumbar  DJD (degenerative joint disease), thoracic    **I have reviewed nursing notes, vital signs, and all appropriate lab and imaging results for this patient.*  A  Lenox Ahr, PA-C 12/09/13 1505

## 2013-12-10 ENCOUNTER — Emergency Department (HOSPITAL_COMMUNITY): Payer: Medicaid Other

## 2013-12-10 DIAGNOSIS — M545 Low back pain, unspecified: Secondary | ICD-10-CM

## 2013-12-10 DIAGNOSIS — M5459 Other low back pain: Secondary | ICD-10-CM | POA: Diagnosis present

## 2013-12-10 DIAGNOSIS — Z789 Other specified health status: Secondary | ICD-10-CM

## 2013-12-10 DIAGNOSIS — M549 Dorsalgia, unspecified: Secondary | ICD-10-CM | POA: Diagnosis present

## 2013-12-10 DIAGNOSIS — I10 Essential (primary) hypertension: Secondary | ICD-10-CM

## 2013-12-10 LAB — CBC WITH DIFFERENTIAL/PLATELET
Basophils Absolute: 0 10*3/uL (ref 0.0–0.1)
Basophils Relative: 0 % (ref 0–1)
Eosinophils Absolute: 0 10*3/uL (ref 0.0–0.7)
Eosinophils Relative: 0 % (ref 0–5)
HCT: 34.5 % — ABNORMAL LOW (ref 36.0–46.0)
Hemoglobin: 11.6 g/dL — ABNORMAL LOW (ref 12.0–15.0)
LYMPHS ABS: 1.3 10*3/uL (ref 0.7–4.0)
LYMPHS PCT: 14 % (ref 12–46)
MCH: 28.4 pg (ref 26.0–34.0)
MCHC: 33.6 g/dL (ref 30.0–36.0)
MCV: 84.4 fL (ref 78.0–100.0)
Monocytes Absolute: 0.8 10*3/uL (ref 0.1–1.0)
Monocytes Relative: 8 % (ref 3–12)
NEUTROS PCT: 78 % — AB (ref 43–77)
Neutro Abs: 7.3 10*3/uL (ref 1.7–7.7)
PLATELETS: 211 10*3/uL (ref 150–400)
RBC: 4.09 MIL/uL (ref 3.87–5.11)
RDW: 14 % (ref 11.5–15.5)
WBC: 9.4 10*3/uL (ref 4.0–10.5)

## 2013-12-10 LAB — BASIC METABOLIC PANEL
Anion gap: 13 (ref 5–15)
BUN: 18 mg/dL (ref 6–23)
CO2: 28 meq/L (ref 19–32)
Calcium: 8.9 mg/dL (ref 8.4–10.5)
Chloride: 95 mEq/L — ABNORMAL LOW (ref 96–112)
Creatinine, Ser: 0.87 mg/dL (ref 0.50–1.10)
GFR calc Af Amer: >90 mL/min (ref >90)
GFR, EST NON AFRICAN AMERICAN: 78 mL/min — AB (ref >90)
Glucose, Bld: 138 mg/dL — ABNORMAL HIGH (ref 70–99)
POTASSIUM: 3.8 meq/L (ref 3.7–5.3)
SODIUM: 136 meq/L — AB (ref 137–147)

## 2013-12-10 LAB — URINALYSIS, ROUTINE W REFLEX MICROSCOPIC
Bilirubin Urine: NEGATIVE
Glucose, UA: NEGATIVE mg/dL
HGB URINE DIPSTICK: NEGATIVE
Ketones, ur: NEGATIVE mg/dL
Leukocytes, UA: NEGATIVE
NITRITE: NEGATIVE
Protein, ur: NEGATIVE mg/dL
SPECIFIC GRAVITY, URINE: 1.022 (ref 1.005–1.030)
UROBILINOGEN UA: 1 mg/dL (ref 0.0–1.0)
pH: 5.5 (ref 5.0–8.0)

## 2013-12-10 LAB — POC URINE PREG, ED: PREG TEST UR: NEGATIVE

## 2013-12-10 MED ORDER — HYDROMORPHONE HCL 1 MG/ML IJ SOLN: 1.0000 mg | Freq: Once | INTRAMUSCULAR | Status: DC

## 2013-12-10 MED ORDER — ENOXAPARIN SODIUM 40 MG/0.4ML ~~LOC~~ SOLN
40.0000 mg | SUBCUTANEOUS | Status: DC
  Administered 2013-12-10 – 2013-12-11 (×2): 40 mg via SUBCUTANEOUS
  Filled 2013-12-10 (×3): qty 0.4

## 2013-12-10 MED ORDER — CYCLOBENZAPRINE HCL 10 MG PO TABS
5.0000 mg | ORAL_TABLET | Freq: Three times a day (TID) | ORAL | Status: DC | PRN
  Administered 2013-12-11 (×2): 5 mg via ORAL
  Filled 2013-12-10 (×2): qty 1

## 2013-12-10 MED ORDER — ALBUTEROL SULFATE HFA 108 (90 BASE) MCG/ACT IN AERS: 2.0000 | INHALATION_SPRAY | Freq: Four times a day (QID) | RESPIRATORY_TRACT | Status: DC | PRN

## 2013-12-10 MED ORDER — ALBUTEROL SULFATE (2.5 MG/3ML) 0.083% IN NEBU: 2.5000 mg | INHALATION_SOLUTION | Freq: Four times a day (QID) | RESPIRATORY_TRACT | Status: DC | PRN

## 2013-12-10 MED ORDER — LOSARTAN POTASSIUM 25 MG PO TABS
12.5000 mg | ORAL_TABLET | Freq: Every day | ORAL | Status: DC
  Administered 2013-12-10 – 2013-12-12 (×3): 12.5 mg via ORAL
  Filled 2013-12-10 (×3): qty 0.5

## 2013-12-10 MED ORDER — IBUPROFEN 600 MG PO TABS
600.0000 mg | ORAL_TABLET | Freq: Four times a day (QID) | ORAL | Status: DC | PRN
  Administered 2013-12-10 – 2013-12-12 (×7): 600 mg via ORAL
  Filled 2013-12-10 (×9): qty 1

## 2013-12-10 MED ORDER — HYDROMORPHONE HCL 1 MG/ML IJ SOLN: 1.0000 mg | INTRAMUSCULAR | Status: AC | PRN

## 2013-12-10 NOTE — ED Provider Notes (Signed)
Pt unable to stand without a lot of assistance; unable to stand unassisted or walk due to pain after MRI so unassigned Med paged since no cauda equina syndrome on MRI. 0940 D/w Fam Med for Obs. 3912  Babette Relic, MD 12/21/13 [phone]

## 2013-12-10 NOTE — ED Notes (Signed)
Patient transported to MRI 

## 2013-12-10 NOTE — Progress Notes (Signed)
Patient arrived to floor from ED. Attempted to start IV and patient would like to rest and wait until later this afternoon to get  IV access. Explained to patient that we will try back later.

## 2013-12-10 NOTE — ED Notes (Signed)
Went in room to start Iv and give Iv pain meds but pt refused.

## 2013-12-10 NOTE — Consult Note (Signed)
Reason for Consult: Lower lower extremity weakness.  HPI:                                                                                                                                          Sarah [name] is an 48 y.o. female the history of hypertension, chronic back pain, degenerative disease of lumbar spine and morbid obesity, presenting with complaint of progressive weakness and numbness of lower extremities starting on 12/07/2013. Patient indicated that she has become weak to the point that she is unable to bear weight. She was seen in emergency room at [facility] yesterday. CT scan of the lumbar spine was unremarkable. She was given Vicodin and diazepam as well as Toradol. Patient stated that she fell at home after she [facility] and was unable to get back up to her feet. She was subsequently admitted here for further evaluation. MRI of the lumbosacral spine showed degenerative arthritic and disc changes but no acute abnormality. She's had no bowel or bladder control changes. She's also had no symptoms involving upper extremities.  Past Medical History  Diagnosis Date  . Hypertension   . DDD (degenerative disc disease), cervical   . DJD (degenerative joint disease)   . Obesity   . Chronic back pain     Past Surgical History  Procedure Laterality Date  . Tonsillectomy      Family History  Problem Relation Age of Onset  . Hypertension Mother   . Diabetes Mother   . Hypertension Father   . Stroke Father     Social History:  reports that she has never smoked. She does not have any smokeless tobacco history on file. She reports that she does not drink alcohol or use illicit drugs.  Allergies  Allergen Reactions  . Tramadol Nausea And Vomiting    MEDICATIONS:                                                                                                                     I have reviewed the patient's current medications.   ROS:  History obtained from the patient  General ROS: negative for - chills, fatigue, fever, night sweats, weight gain or weight loss Psychological ROS: negative for - behavioral disorder, hallucinations, memory difficulties, mood swings or suicidal ideation Ophthalmic ROS: negative for - blurry vision, double vision, eye pain or loss of vision ENT ROS: negative for - epistaxis, nasal discharge, oral lesions, sore throat, tinnitus or vertigo Allergy and Immunology ROS: negative for - hives or itchy/watery eyes Hematological and Lymphatic ROS: negative for - bleeding problems, bruising or swollen lymph nodes Endocrine ROS: negative for - galactorrhea, hair pattern changes, polydipsia/polyuria or temperature intolerance Respiratory ROS: negative for - cough, hemoptysis, shortness of breath or wheezing Cardiovascular ROS: negative for - chest pain, dyspnea on exertion, edema or irregular heartbeat Gastrointestinal ROS: negative for - abdominal pain, diarrhea, hematemesis, nausea/vomiting or stool incontinence Genito-Urinary ROS: negative for - dysuria, hematuria, incontinence or urinary frequency/urgency Musculoskeletal ROS: negative for - joint swelling or muscular weakness Neurological ROS: as noted in HPI Dermatological ROS: negative for rash and skin lesion changes   Blood pressure 177/91, pulse 95, temperature 98 F (36.7 C), temperature source Oral, resp. rate 16, last menstrual period 11/28/2013, SpO2 99.00%.   Neurologic Examination:                                                                                                      Mental Status: Alert, oriented, somewhat flat affect.  Speech fluent without evidence of aphasia. Able to follow commands without difficulty. Cranial Nerves: II-Visual fields were normal. III/IV/VI-Pupils were equal and reacted. Extraocular movements  were full and conjugate.    V/VII-no facial numbness and no facial weakness. VIII-normal. X-normal speech. Motor: Strength of upper extremities was normal proximally and distally. It was equivocal mild weakness of right hip flexors (4/5) versus poor effort. Strength of left hip flexors was normal. Quadriceps, hamstrings, tibialis anterior and gastrocnemius muscles were normal and strength bilaterally. Muscle tone was normal throughout. Sensory: Normal throughout. Deep Tendon Reflexes: 1+ and symmetric at knees and trace only of ankles. Plantars: Inconsistent responses bilaterally, but mostly flexor. Cerebellar: Normal finger-to-nose testing.  No results found for this basename: cbc, bmp, coags, chol, tri, ldl, hga1c    Results for orders placed during the hospital encounter of 12/09/13 (from the past 48 hour(s))  CBC WITH DIFFERENTIAL     Status: Abnormal   Collection Time    12/10/13  9:50 AM      Result Value Ref Range   WBC 9.4  4.0 - 10.5 K/uL   RBC 4.09  3.87 - 5.11 MIL/uL   Hemoglobin 11.6 (*) 12.0 - 15.0 g/dL   HCT 34.5 (*) 36.0 - 46.0 %   MCV 84.4  78.0 - 100.0 fL   MCH 28.4  26.0 - 34.0 pg   MCHC 33.6  30.0 - 36.0 g/dL   RDW 14.0  11.5 - 15.5 %   Platelets 211  150 - 400 K/uL   Neutrophils Relative % 78 (*) 43 - 77 %   Neutro Abs 7.3  1.7 - 7.7 K/uL   Lymphocytes  Relative 14  12 - 46 %   Lymphs Abs 1.3  0.7 - 4.0 K/uL   Monocytes Relative 8  3 - 12 %   Monocytes Absolute 0.8  0.1 - 1.0 K/uL   Eosinophils Relative 0  0 - 5 %   Eosinophils Absolute 0.0  0.0 - 0.7 K/uL   Basophils Relative 0  0 - 1 %   Basophils Absolute 0.0  0.0 - 0.1 K/uL  BASIC METABOLIC PANEL     Status: Abnormal   Collection Time    12/10/13  9:50 AM      Result Value Ref Range   Sodium 136 (*) 137 - 147 mEq/L   Potassium 3.8  3.7 - 5.3 mEq/L   Chloride 95 (*) 96 - 112 mEq/L   CO2 28  19 - 32 mEq/L   Glucose, Bld 138 (*) 70 - 99 mg/dL   BUN 18  6 - 23 mg/dL   Creatinine, Ser 0.87  0.50 - 1.10  mg/dL   Calcium 8.9  8.4 - 10.5 mg/dL   GFR calc non Af Amer 78 (*) >90 mL/min   GFR calc Af Amer >90  >90 mL/min   Comment: (NOTE)     The eGFR has been calculated using the CKD EPI equation.     This calculation has not been validated in all clinical situations.     eGFR's persistently <90 mL/min signify possible Chronic Kidney     Disease.   Anion gap 13  5 - 15  URINALYSIS, ROUTINE W REFLEX MICROSCOPIC     Status: Abnormal   Collection Time    12/10/13 10:36 AM      Result Value Ref Range   Color, Urine YELLOW  YELLOW   APPearance CLOUDY (*) CLEAR   Specific Gravity, Urine 1.022  1.005 - 1.030   pH 5.5  5.0 - 8.0   Glucose, UA NEGATIVE  NEGATIVE mg/dL   Hgb urine dipstick NEGATIVE  NEGATIVE   Bilirubin Urine NEGATIVE  NEGATIVE   Ketones, ur NEGATIVE  NEGATIVE mg/dL   Protein, ur NEGATIVE  NEGATIVE mg/dL   Urobilinogen, UA 1.0  0.0 - 1.0 mg/dL   Nitrite NEGATIVE  NEGATIVE   Leukocytes, UA NEGATIVE  NEGATIVE   Comment: MICROSCOPIC NOT DONE ON URINES WITH NEGATIVE PROTEIN, BLOOD, LEUKOCYTES, NITRITE, OR GLUCOSE <1000 mg/dL.  POC URINE PREG, ED     Status: None   Collection Time    12/10/13 10:44 AM      Result Value Ref Range   Preg Test, Ur NEGATIVE  NEGATIVE   Comment:            THE SENSITIVITY OF THIS     METHODOLOGY IS >24 mIU/mL    Ct Lumbar Spine Wo Contrast  12/09/2013   CLINICAL DATA:  Low back pain with difficulty walking.  EXAM: CT LUMBAR SPINE WITHOUT CONTRAST  TECHNIQUE: Multidetector CT imaging of the lumbar spine was performed without intravenous contrast administration. Multiplanar CT image reconstructions were also generated.  COMPARISON:  None.  FINDINGS: There is no fracture or spondylolisthesis. There is moderately severe disc space narrowing at L5-S1 with slight vacuum phenomenon at this level. There is moderate narrowing at L4-5. There are prominent anterior osteophytes at L2, L3, L4, and L5.  At T12-L1, there is moderate facet hypertrophy bilaterally.  There is no nerve root edema or effacement. No disc extrusion or stenosis.  At L1-2, there is moderate facet hypertrophy bilaterally. There is mild diffuse disc bulging.  There is no nerve root edema or effacement. No disc extrusion or stenosis.  At L2-3, there is broad-based disc bulging and moderate facet hypertrophy bilaterally. There is borderline narrowing of the thecal sac with borderline spinal stenosis. No disc extrusion.  At L3-4, there is severe facet osteoarthritic change bilaterally, more pronounced on the left than on the right. There is broad-based disc bulging and moderate ligamentum flavum hypertrophy. These findings in concert lead to moderate generalized spinal stenosis.  At L4-5, there is broad-based disc protrusion and severe facet osteoarthritic change bilaterally. These findings in concert lead to moderate generalized spinal stenosis. No frank disc extrusion.  At L5-S1, there is severe facet osteoarthritic change on the left and moderate facet osteoarthritic change on the right. There is broad-based disc protrusion. There is exit foraminal narrowing bilaterally, more severe on the left than on the right, due to osteophytic change. No frank disc extrusion or stenosis is seen at this level.  There are no paraspinous lesions.  IMPRESSION: Spinal stenosis at L3-4 and L4-5, multifactorial. Borderline spinal stenosis at L2-3. 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  Multilevel arthropathy.  No frank disc extrusion.  No fracture or spondylolisthesis.   Electronically Signed   By: Lowella Grip M.D.   On: 12/09/2013 13:50   Mr Lumbar Spine Wo Contrast  12/10/2013   CLINICAL DATA:  Back pain with bilateral leg numbness, worsening with oral steroids.  EXAM: MRI LUMBAR SPINE WITHOUT CONTRAST  TECHNIQUE: Multiplanar, multisequence MR imaging of the lumbar spine was performed. No intravenous contrast was administered.  COMPARISON:  CT  12/09/2013  FINDINGS: There is no degenerative disease at L2-3 or above. The discs are normal in signal characteristics and morphology. The spinal canal and foramina are widely patent. The distal cord and conus are normal with conus tip at L1. There are small benign and insignificant hemangiomas within the L2 and L3 vertebral bodies.  L3-4: Minimal desiccation of the disc. Mild circumferential bulging slightly more prominent towards the left. Bilateral facet degeneration and hypertrophy. Mild narrowing of the lateral recesses without apparent neural compression. Mild foraminal narrowing on the left without apparent neural compression.  L4-5: Desiccation of the disc with circumferential protrusion. Bilateral facet and ligamentous hypertrophy. Stenosis of both lateral recesses without definite focal neural compression. Mild foraminal narrowing bilaterally.  L5-S1: Desiccation of the disc with loss of height. Endplate osteophytes and bulging of the disc more prominent towards the left. Bilateral facet degeneration and hypertrophy left more than right. Stenosis of the subarticular lateral recess on the left that could compress the S1 nerve root. Mild foraminal narrowing left more than right but without apparent compression of the exiting L5 nerve roots.  Serpiginous fluid-filled structure in the left pelvis could represent hydrosalpinx. This is not primarily or completely imaged.  IMPRESSION: Degenerative disc disease and degenerative facet disease in the lower lumbar spine that could be a cause of lumbago.  Lateral recess and foraminal narrowing at L3-4, L4-5 and L5-S1 as described above that could be a cause of focal neural compression. In general, this is more pronounced on the left than on the right. In particular, there is potential for left S1 nerve root compression in the subarticular lateral recess at L5-S1.   Electronically Signed   By: Nelson Chimes M.D.   On: 12/10/2013 08:45   Assessment/Plan: Etiology for  bilateral acute lower extremity weakness is unclear. Patient has no clinical signs of an  acute myelopathy. She also has no clinical indications acute cauda equina syndrome. Findings are not consistent with acute radiculopathy, as well. I suspect there are significant psychophysiologic factors contributing to this patient's presenting symptomatology.  Recommendations: 1. No further neurodiagnostic imaging studies are indicated at this point. 2. Agree with physical therapy intervention for management of lower extremity and gait issues. 3. No changes in current management of pain. 4. Nutrition consultation for dietary changes and managing marked obesity.  C.R. Nicole Kindred, MD [facility] [phone]  12/10/2013, 5:25 PM

## 2013-12-10 NOTE — H&P (Signed)
[facility] ATTENDING ADMISSION NOTE Edona Schreffler,MD I  have seen and examined this patient, reviewed their chart. I have discussed this patient with the resident. I agree with the resident's findings, assessment and care plan.  48 Y/O F with PMX of morbid obesity,HTN presented with low back pain that started few days ago and progressively worsened,3 days ago she developed tingling and weakness of her LL worse on the left when compared to right, she was seen at [facility] where she had CT spine done and was informed she has DJD. Some medications was given which she did not tolerate well. She also mentioned her leg gave out on her and was unable to stand. Since then she has been unable to ambulate. Patient also c/o difficulty with urination in the last few hrs. This is new for her. She stated she had straight cath while in our ED where about 500cc of urine was taken out.  No current facility-administered medications on file prior to encounter.   Current Outpatient Prescriptions on File Prior to Encounter  Medication Sig Dispense Refill  . albuterol (PROVENTIL HFA;VENTOLIN HFA) 108 (90 BASE) MCG/ACT inhaler Inhale 2 puffs into the lungs every 6 (six) hours as needed for wheezing or shortness of breath.      [name] Kitchen azithromycin (ZITHROMAX) 250 MG tablet Take 1 tablet (250 mg total) by mouth daily. Take first 2 tablets together, then 1 every day until finished.  6 tablet  0  . diazepam (VALIUM) 5 MG tablet 1 po tid for spasm pain  21 tablet  0  . ibuprofen (ADVIL,MOTRIN) 200 MG tablet Take 600 mg by mouth every 6 (six) hours as needed. pain      . predniSONE (DELTASONE) 20 MG tablet 3 tabs po daily x 3 days, then 2 tabs x 3 days, then 1.5 tabs x 3 days, then 1 tab x 3 days, then 0.5 tabs x 3 days  27 tablet  0  . diclofenac (VOLTAREN) 75 MG EC tablet Take 1 tablet (75 mg total) by mouth 2 (two) times daily.  14 tablet  0  . HYDROcodone-acetaminophen (NORCO/VICODIN) 5-325 MG per tablet Take 1 tablet by mouth every  4 (four) hours as needed.  20 tablet  0   Past Medical History  Diagnosis Date  . Hypertension   . DDD (degenerative disc disease), cervical   . DJD (degenerative joint disease)   . Obesity   . Chronic back pain    Filed Vitals:   12/10/13 0500 12/10/13 0600 12/10/13 0700 12/10/13 1100  BP: 168/84 156/85 152/78 165/83  Pulse: 84 81 82 97  Temp:      TempSrc:      Resp: 16 18 19 19   SpO2: 98% 100% 99% 100%   Exam: Gen: Calm in bed, not in distress, morbidly obese. HEENT: EOMI, PERRLA Neuro: Awake and alert, no sensory loss, power of LL about 2-3/5 B/L,muscle tone normal, ++DTR,+ Barbinski reflex B/L. Resp: Air entry equal B/L and clear. CV: S1 S2 normal, no murmur. Abd: Soft, NT/ND, BS+\ Ext: No edema.  A/P: 48 Y/O F with 1. Intractable back pain and Lower extremity weakness: ?? Nerve compression symptoms     MRI spine reviewed: showed lateral recess and foraminal narrowing at L3-4, L4-5 and L5-S1 more pronounced on        the left than on the right. There is potential for left S1 nerve root compression in the subarticular lateral recess at         L5-S1.  Pain control, PT/OT.     I recommended consulting neurosurgery due to MRI and symptoms ( associated with possible urinary retention).     Resident in charge will follow up on this.     NSAID and muscle relaxant recommended.  2. Urinary retention: S/P straight cath with 500cc urine removed.     May be related to above.     Monitor urine output.     I/O cath as needed.     If persistent may also consider obtaining UA but unlikely related to infection.  3. HTN: I agree with starting on antihypertensive.

## 2013-12-10 NOTE — ED Notes (Signed)
Bladder scanner performed- 58ml in bladder. Dr. Kathrynn Humble made aware- stated, "give her time to void on her own because we can't give her any water at this time." Patient updated.

## 2013-12-10 NOTE — ED Notes (Signed)
Patient assisted to the bedside commode at this time.

## 2013-12-10 NOTE — H&P (Signed)
[facility] Admission History and Physical Service Pager: [phone]  Patient name: Sarah [name] Medical record number: [id number] Date of birth: 13-Mar-1966 Age: 48 y.o. Gender: female  Primary Care Provider: No PCP Per Patient Consultants: neuro Code Status: full  Chief Complaint: i can't stand  Assessment and Plan: Sarah [name] is a 48 y.o. female presenting with LBP and sudden inability to walk. PMH is significant for HTN  #Acute on chronic LBP with inability to stand- Unclear precipitant, progressively worsening, pt unable to bear weight vs true balance disorder; DDD with potential nerve root compression (unclear whether this is acute vs chronic) vs no infectious etiology (no abscess visualized on CT/MRI, no fever, no white count); no acute trauma (other than pulled mm 6 weeks ago); doubt intracranial pathology (equiv babinski's but no hyperreflexia, nml cerebellar testing nml CN and UE, doubt MS given isolated event) vs thoracic spine compression (pain mostly located in this area however not location of peripheral roots enervating legs, would be abnormal; No dermatomal level in abdomen) vs demyelinating process (neg MRI) vs mm spasm and body habitus.  -admit to obs under Dr Gwendlyn Deutscher -PT/OT consult -neurology consult regarding further imaging of spine vs Head -pain control with ibuprofen -flexeril for mm spasm -i/o cath while unable to walk -bed rest or UOB with at least a 2 assist -A1c for potential neuropathy -if work up neg, potential need for EMG testing?  #Hypervirilization - with clitoromegaly and buffalo hump and obesity as well as upper lip hair. Pt with questionable adverse reaction to prednisone. May all be related to a PCOS vs adrenal pathology. Would be unlike to be first presentation of HPA pathology; no evidence of salt wasting on labs. Testing now that she has received steroids would be largely non-diagnostic at this point -outpt vs inpt w/up  including [place] to assess ovaries vs 21-hydroxylase/ACTH/corisol  #HTN- BP largely uncontrolled 150s-180s/70s-90s; previously on BP med that made her feel badly? Unclear what kind -will start with losartan 12.5mg   -vitals per floor protocol  #Obesity- believe this is largely contributing to ambulation issues -needs aggressive dietary and exercise intervention  FEN/GI: Reg diet; KVO Prophylaxis: HSQ  Disposition: admit to obs under Dr. Gwendlyn Deutscher pending PT/OT recs and further w/up  History of Present Illness: Sarah [name] is a 48 y.o. female presenting with inability to stand. Initially started with pulled mm up near her lung about 6 weeks ago. Had CXR and diagnosed with pleurisy. Placed on abx(zpac) and steroids (60mg  pred) as well as an inhaler on Sunday 12/04/13. Wednesday legs began tingling, balance was off. By Friday morning was numbness across back and numb down her legs. Went to [facility] 9/18 CT lumbar spine ordered with arthropathy and spondy but no acute. Was given vicodin, valium, toradol d/c home with some improvement in sx but then nauseated.  Was concerned bc could not bear weight at home. Walked 1 step and then fell down thus came back to the ED via EMS. No fever chills n/v. Denies dizziness confusion headaches. Does report numbness and tingling on bilat legs.   In the ED pt subsequently followed by MRI lumbar this morning S1 compression, mild degen changes. Attempted to stand patient up to get home into wheelchair. Partial weight bearing and then collapsed back in the bed. Back pain overall improved but pt mostly concerned abt inability to stand.  Review Of Systems: Per HPI with the following additions: none Otherwise 12 point review of systems was performed and was  unremarkable.  Patient Active Problem List   Diagnosis Date Noted  . Intractable low back pain 12/10/2013   Past Medical History: Past Medical History  Diagnosis Date  . Hypertension   . DDD (degenerative disc disease),  cervical   . DJD (degenerative joint disease)   . Obesity   . Chronic back pain    Past Surgical History: Past Surgical History  Procedure Laterality Date  . Tonsillectomy     Social History: History  Substance Use Topics  . Smoking status: Never Smoker   . Smokeless tobacco: Not on file  . Alcohol Use: No   Additional social history: able to perform ADLs at baseline Please also refer to relevant sections of EMR.  Family History: Family History  Problem Relation Age of Onset  . Hypertension Mother   . Diabetes Mother   . Hypertension Father   . Stroke Father    Allergies and Medications: Allergies  Allergen Reactions  . Tramadol Nausea And Vomiting   No current facility-administered medications on file prior to encounter.   Current Outpatient Prescriptions on File Prior to Encounter  Medication Sig Dispense Refill  . albuterol (PROVENTIL HFA;VENTOLIN HFA) 108 (90 BASE) MCG/ACT inhaler Inhale 2 puffs into the lungs every 6 (six) hours as needed for wheezing or shortness of breath.      [name] Kitchen azithromycin (ZITHROMAX) 250 MG tablet Take 1 tablet (250 mg total) by mouth daily. Take first 2 tablets together, then 1 every day until finished.  6 tablet  0  . diazepam (VALIUM) 5 MG tablet 1 po tid for spasm pain  21 tablet  0  . ibuprofen (ADVIL,MOTRIN) 200 MG tablet Take 600 mg by mouth every 6 (six) hours as needed. pain      . predniSONE (DELTASONE) 20 MG tablet 3 tabs po daily x 3 days, then 2 tabs x 3 days, then 1.5 tabs x 3 days, then 1 tab x 3 days, then 0.5 tabs x 3 days  27 tablet  0  . diclofenac (VOLTAREN) 75 MG EC tablet Take 1 tablet (75 mg total) by mouth 2 (two) times daily.  14 tablet  0  . HYDROcodone-acetaminophen (NORCO/VICODIN) 5-325 MG per tablet Take 1 tablet by mouth every 4 (four) hours as needed.  20 tablet  0    Objective: BP 152/78  Pulse 82  Temp(Src) 98.6 F (37 C) (Oral)  Resp 19  SpO2 99%  LMP 11/28/2013 Exam: General: lying on back in bed  with towel on head HEENT: pinpoint pupils; thick supple neck; buffalo hump Cardiovascular: RRR, nml s/1, no mrg Respiratory: CTAB, normal WOB Abdomen: obesity, SNTND, normactive GU: clitoromegaly vs micropenis Extremities: MSK strength 4/5 bilaterally in LE; no palpable spinous step offs Skin: no evidence of breakdown Neuro: CNii-xii intact; no difficulty with FTN, RAM, equivocal babinski's bilaterally 2+/4 patellar reflexes; UE wnl; pin prick sensation on light touch of bilateral lower extremity  Labs and Imaging: CBC BMET   Recent Labs Lab 12/10/13 0950  WBC 9.4  HGB 11.6*  HCT 34.5*  PLT 211    Recent Labs Lab 12/04/13 1851  NA 137  K 3.7  CL 100  CO2 25  BUN 14  CREATININE 1.21*  GLUCOSE 124*  CALCIUM 8.8     CT lumbar spine 12/09/13 IMPRESSION:  Spinal stenosis at L3-4 and L4-5, multifactorial. Borderline spinal  stenosis at L2-3. There does appear to be a degree of congenital  narrowing of the canal which contributes to spinal stenosis.  Multilevel  arthropathy.  No frank disc extrusion.  No fracture or spondylolisthesis.  MRI lumbar spine 12/10/2013 IMPRESSION:  Degenerative disc disease and degenerative facet disease in the  lower lumbar spine that could be a cause of lumbago.  Lateral recess and foraminal narrowing at L3-4, L4-5 and L5-S1 as  described above that could be a cause of focal neural compression.  In general, this is more pronounced on the left than on the right.  In particular, there is potential for left S1 nerve root compression  in the subarticular lateral recess at L5-S1.   Bernadene Bell, MD 12/10/2013, 10:10 AM PGY-2, [facility] pager: [phone], text pages welcome

## 2013-12-10 NOTE — ED Notes (Signed)
Patient states that she is unable to void at this time.  Dr. Kathrynn Humble made aware and verbal order given to bladder scan patient.

## 2013-12-10 NOTE — ED Provider Notes (Signed)
CSN: [id number]     Arrival date & time 12/09/13  2036 History   First MD Initiated Contact with Patient 12/09/13 2312     Chief Complaint  Patient presents with  . Back Pain     (Consider location/radiation/quality/duration/timing/severity/associated sxs/prior Treatment) HPI Comments: Pt comes in with cc of back pain and numbness. Hx of DJD of the back. States that she started having back pain on Thursday, went to [facility] as she was having numbness and difficulty with walking. CT spine done - no acute processes. Pt was discharged. States that while she was walking, her legs completely gave out, she fell. Also reports that prior to her leg giving out, pt was having trouble maintaining her gait and balance, as he legs were "wobbly." Currently numbness is her bilateral legs, saddle area. No urinary incontinence or retention.   [name] a 48 y.o. female presenting with back pain. The history is provided by the patient.  Back Pain Associated symptoms: numbness and weakness   Associated symptoms: no abdominal pain, no chest pain, no dysuria and no headaches     Past Medical History  Diagnosis Date  . Hypertension   . DDD (degenerative disc disease), cervical   . DJD (degenerative joint disease)   . Obesity   . Chronic back pain    Past Surgical History  Procedure Laterality Date  . Tonsillectomy     Family History  Problem Relation Age of Onset  . Hypertension Mother   . Diabetes Mother   . Hypertension Father   . Stroke Father    History  Substance Use Topics  . Smoking status: Never Smoker   . Smokeless tobacco: Not on file  . Alcohol Use: No   OB History   Grav Para Term Preterm Abortions TAB SAB Ect Mult Living                 Review of Systems  Constitutional: Positive for activity change.  Eyes: Negative for visual disturbance.  Respiratory: Negative for shortness of breath.   Cardiovascular: Negative for chest pain.  Gastrointestinal: Negative for  abdominal pain.  Endocrine: Negative for polyuria.  Genitourinary: Negative for dysuria, flank pain and difficulty urinating.  Musculoskeletal: Positive for back pain and gait problem. Negative for neck pain.  Skin: Negative for rash.  Neurological: Positive for weakness and numbness. Negative for headaches.      Allergies  Tramadol  Home Medications   Prior to Admission medications   Medication Sig Start Date End Date Taking? Authorizing Provider  albuterol (PROVENTIL HFA;VENTOLIN HFA) 108 (90 BASE) MCG/ACT inhaler Inhale 2 puffs into the lungs every 6 (six) hours as needed for wheezing or shortness of breath.   Yes Historical Provider, MD  azithromycin (ZITHROMAX) 250 MG tablet Take 1 tablet (250 mg total) by mouth daily. Take first 2 tablets together, then 1 every day until finished. 12/04/13  Yes Orpah Greek, MD  diazepam (VALIUM) 5 MG tablet 1 po tid for spasm pain 12/09/13  Yes Lenox Ahr, PA-C  ibuprofen (ADVIL,MOTRIN) 200 MG tablet Take 600 mg by mouth every 6 (six) hours as needed. pain   Yes Historical Provider, MD  predniSONE (DELTASONE) 20 MG tablet 3 tabs po daily x 3 days, then 2 tabs x 3 days, then 1.5 tabs x 3 days, then 1 tab x 3 days, then 0.5 tabs x 3 days 12/04/13  Yes Orpah Greek, MD  diclofenac (VOLTAREN) 75 MG EC tablet Take 1 tablet (75  mg total) by mouth 2 (two) times daily. 12/09/13   Lenox Ahr, PA-C  HYDROcodone-acetaminophen (NORCO/VICODIN) 5-325 MG per tablet Take 1 tablet by mouth every 4 (four) hours as needed. 12/09/13   Lenox Ahr, PA-C   BP 147/81  Pulse 76  Temp(Src) 98.6 F (37 C) (Oral)  Resp 18  SpO2 100%  LMP 11/28/2013 Physical Exam  Nursing note and vitals reviewed. Constitutional: She is oriented to person, place, and time. She appears well-developed.  Eyes: Conjunctivae are normal.  Neck: Neck supple.  Cardiovascular: Normal rate.   Pulmonary/Chest: Effort normal.  Abdominal: Soft.  Neurological: She  is alert and oriented to person, place, and time. No cranial nerve deficit.  Pt has tenderness over the lumbar region No step offs, no erythema. Pt has 1+ patellar reflex bilaterally. Pt has bilateral lower extremity subjective numbness - worse below the knee. Able to ambulate - but noted to need assistance. Pt has unstable gait. No dysmetria  Skin: Skin is warm.    ED Course  Procedures (including critical care time) Labs Review Labs Reviewed  URINALYSIS, ROUTINE W REFLEX MICROSCOPIC    Imaging Review Ct Lumbar Spine Wo Contrast  12/09/2013   CLINICAL DATA:  Low back pain with difficulty walking.  EXAM: CT LUMBAR SPINE WITHOUT CONTRAST  TECHNIQUE: Multidetector CT imaging of the lumbar spine was performed without intravenous contrast administration. Multiplanar CT image reconstructions were also generated.  COMPARISON:  None.  FINDINGS: There is no fracture or spondylolisthesis. There is moderately severe disc space narrowing at L5-S1 with slight vacuum phenomenon at this level. There is moderate narrowing at L4-5. There are prominent anterior osteophytes at L2, L3, L4, and L5.  At T12-L1, there is moderate facet hypertrophy bilaterally. There is no nerve root edema or effacement. No disc extrusion or stenosis.  At L1-2, there is moderate facet hypertrophy bilaterally. There is mild diffuse disc bulging. There is no nerve root edema or effacement. No disc extrusion or stenosis.  At L2-3, there is broad-based disc bulging and moderate facet hypertrophy bilaterally. There is borderline narrowing of the thecal sac with borderline spinal stenosis. No disc extrusion.  At L3-4, there is severe facet osteoarthritic change bilaterally, more pronounced on the left than on the right. There is broad-based disc bulging and moderate ligamentum flavum hypertrophy. These findings in concert lead to moderate generalized spinal stenosis.  At L4-5, there is broad-based disc protrusion and severe facet  osteoarthritic change bilaterally. These findings in concert lead to moderate generalized spinal stenosis. No frank disc extrusion.  At L5-S1, there is severe facet osteoarthritic change on the left and moderate facet osteoarthritic change on the right. There is broad-based disc protrusion. There is exit foraminal narrowing bilaterally, more severe on the left than on the right, due to osteophytic change. No frank disc extrusion or stenosis is seen at this level.  There are no paraspinous lesions.  IMPRESSION: Spinal stenosis at L3-4 and L4-5, multifactorial. Borderline spinal stenosis at L2-3. 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  Multilevel arthropathy.  No frank disc extrusion.  No fracture or spondylolisthesis.   Electronically Signed   By: Lowella Grip M.D.   On: 12/09/2013 13:50     EKG Interpretation None      MDM   Final diagnoses:  None    Pt comes in with back pain. Patient has lumbar spine pain, with associated numbness, tingling, saddle anesthesia. There is no  urinary incontinence or retention. Gait is unsteady, legs giving out. Strength exam is 4+ for bilateral lower extremities, + reflex. CT scan reviewed. There is some DJD and disk protrusion and stenosis.  MRI ordered, and will be done in the AM. Dont think there is epidural abscess or severe cord compression, so i called [facility] for emergent MRI, but we all think urgent MRI will be better.    Varney Biles, MD 12/10/13 [phone]

## 2013-12-11 DIAGNOSIS — R29898 Other symptoms and signs involving the musculoskeletal system: Principal | ICD-10-CM

## 2013-12-11 LAB — HEMOGLOBIN A1C
Hgb A1c MFr Bld: 6.9 % — ABNORMAL HIGH (ref <5.7)
MEAN PLASMA GLUCOSE: 151 mg/dL — AB (ref <117)

## 2013-12-11 NOTE — Progress Notes (Signed)
Family Medicine Teaching Service Daily Progress Note Intern Pager: [phone]  Patient name: Sarah [name] Medical record number: [id number] Date of birth: [birth year] Age: 48 y.o. Gender: female  Primary Care Provider: No PCP Per Patient Consultants: neuro Code Status: full  Pt Overview and Major Events to Date:  9/19 - admitted with LE weakness  Assessment and Plan: Sarah [name] is a 48 y.o. female presenting with LBP and LE weakness. PMH is significant for HTN   #Acute on chronic LBP with inability to stand- Unclear precipitant, progressively worsening, pt unable to bear weight vs true balance disorder; DDD with potential nerve root compression vs mm spasm and body habitus.  -PT/OT consult  -neurology consulted, rec no further workup -pain control with ibuprofen  -flexeril for mm spasm  -up with assistance -A1c for potential neuropathy   #Hypervirilization - with clitoromegaly and buffalo hump and obesity as well as upper lip hair. Pt with questionable adverse reaction to prednisone. Likely all be related to a PCOS. Testing now that she has received steroids would be largely non-diagnostic at this point  - rec outpt w/up  #HTN- BP largely uncontrolled 150s-180s/70s-90s; previously on BP med that made her feel badly? Unclear what kind  - losartan 12.5mg  started 9/19 at 1500, will monitor on this before increasing further - vitals per floor protocol   #Obesity- believe this is largely contributing to ambulation issues  -needs aggressive dietary and exercise intervention   FEN/GI: Reg diet; KVO  Prophylaxis: HSQ  Disposition: home pending PT/OT for ambulation, likely tomorrow  Subjective:  Feeling better. Able to get up to bedside commode. Complains of some tingling in abdomen where she previously did not have any feeling, like it is "waking up"  Objective: Temp:  [97.6 F (36.4 C)-98.8 F (37.1 C)] 97.9 F (36.6 C) (09/20 0552) Pulse Rate:  [77-101] 77 (09/20  0552) Resp:  [14-19] 16 (09/20 0552) BP: (153-177)/(73-91) 153/78 mmHg (09/20 0552) SpO2:  [97 %-100 %] 100 % (09/20 0552) Physical Exam: General: lying on back in bed, NAD HEENT: thick supple neck; buffalo hump  Cardiovascular: RRR, nml s/1, no mrg  Respiratory: CTAB, normal WOB  Abdomen: obesity, SNTND, normactive bowel sounds Extremities: MSK strength 4/5 bilaterally in LE  Skin: no evidence of breakdown  Neuro: alert and oriented, no gross deficits, light touch sensation intact bilaterally   Laboratory:  Recent Labs Lab 12/04/13 1851 12/10/13 0950  WBC 7.7 9.4  HGB 11.8* 11.6*  HCT 34.6* 34.5*  PLT 224 211    Recent Labs Lab 12/04/13 1851 12/10/13 0950  NA 137 136*  K 3.7 3.8  CL 100 95*  CO2 25 28  BUN 14 18  CREATININE 1.21* 0.87  CALCIUM 8.8 8.9  GLUCOSE 124* 138*    Imaging/Diagnostic Tests: CT lumbar spine 12/09/13  Spinal stenosis at L3-4 and L4-5, multifactorial. Borderline spinal stenosis at L2-3. 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 Multilevel arthropathy. No frank disc extrusion. No fracture or spondylolisthesis.   MRI lumbar spine 12/10/2013  Degenerative disc disease and degenerative facet disease in the lower lumbar spine that could be a cause of lumbago. Lateral recess and foraminal narrowing at L3-4, L4-5 and L5-S1 as described above that could be a cause of focal neural compression. In general, this is more pronounced on the left than on the right. In particular, there is potential for left S1 nerve root compression in the subarticular lateral recess at  L5-S1.   Frazier Richards, MD 12/11/2013, 8:37 AM PGY-2, [place] Intern pager: [phone], text pages welcome

## 2013-12-11 NOTE — Progress Notes (Signed)
Occupational Therapy Evaluation Patient Details Name: Sarah [name] MRN: [id number] DOB: 1965-04-24 Today's Date: 12/11/2013    History of Present Illness 48 y.o. admitted with intractable back pain.  CT is unremarkable showing only DJD.   Clinical Impression   PTA pt lived at home and was independent with ADLs and functional mobility. Pt currently limited by LE weakness and back pain as well as fear of falling, which impair her independence with functional mobility and LB ADLs. Pt would benefit from acute OT for AE training for LB ADLs and for functional mobility. Educated pt on back limitations and body mechanics to promote back health.    Follow Up Recommendations  No OT follow up;Supervision/Assistance - 24 hour    Equipment Recommendations  3 in 1 bedside comode    Recommendations for Other Services       Precautions / Restrictions Precautions Precautions: Fall Restrictions Weight Bearing Restrictions: No      Mobility Bed Mobility Overal bed mobility: Needs Assistance Bed Mobility: Rolling;Sidelying to Sit Rolling: Min assist Sidelying to sit: Min assist       General bed mobility comments: instructed in logroll for comfort/pain management  Transfers Overall transfer level: Needs assistance Equipment used: Rolling walker (2 wheeled) Transfers: Sit to/from [facility] Sit to Stand: Min assist Stand pivot transfers: Min assist       General transfer comment: VC's for hand placement, min (A) to power up and stabilize RW         ADL Overall ADL's : Needs assistance/impaired Eating/Feeding: Independent;Sitting   Grooming: Set up;Sitting   Upper Body Bathing: Set up;Sitting   Lower Body Bathing: Minimal assistance;Sit to/from stand   Upper Body Dressing : Set up;Sitting   Lower Body Dressing: Moderate assistance;Sit to/from stand   Toilet Transfer: Minimal assistance;+2 for safety/equipment;Stand-pivot;RW;BSC            Functional mobility during ADLs: Minimal assistance;+2 for safety/equipment;Rolling walker General ADL Comments: Pt limited by general LE weakness and feels "wobbly," admits that she is fearful of falling and this may be limiting her. Pt mobility improves with close chair follow.      Vision  Pt reports no change from baseline and has no apparent deficits.                    Perception Perception Perception Tested?: No   Praxis Praxis Praxis tested?: Within functional limits    Pertinent Vitals/Pain Pain Assessment: 0-10 Pain Score: 4  Pain Location: low back Pain Descriptors / Indicators: Aching Pain Intervention(s): Monitored during session;Repositioned     Hand Dominance Right   Extremity/Trunk Assessment Upper Extremity Assessment Upper Extremity Assessment: Overall WFL for tasks assessed   Lower Extremity Assessment Lower Extremity Assessment: Generalized weakness   Cervical / Trunk Assessment Cervical / Trunk Assessment: Normal   Communication Communication Communication: No difficulties   Cognition Arousal/Alertness: Awake/alert Behavior During Therapy: WFL for tasks assessed/performed Overall Cognitive Status: Within Functional Limits for tasks assessed                                Home Living Family/patient expects to be discharged to:: Private residence Living Arrangements: Parent Available Help at Discharge: Family Type of Home: House Home Access: Stairs to enter [employer] of Steps: 2 (deep enough for all 4 legs of RW Entrance Stairs-Rails: None Home Layout: One level     Bathroom Shower/Tub: Tub/shower unit Shower/tub characteristics:  [employer]ist, clinical: Standard     Home Equipment: None          Prior Functioning/Environment Level of Independence: Independent        Comments: Pt works at [employer].     OT Diagnosis: Generalized weakness;Acute pain   OT Problem List: Decreased  strength;Decreased activity tolerance;Impaired balance (sitting and/or standing);Decreased knowledge of use of DME or AE;Pain   OT Treatment/Interventions: Self-care/ADL training;Therapeutic exercise;Energy conservation;DME and/or AE instruction;Therapeutic activities;Patient/family education;Balance training    OT Goals(Current goals can be found in the care plan section) Acute Rehab OT Goals Patient Stated Goal: to have my back feel better OT Goal Formulation: With patient Time For Goal Achievement: 12/25/13 Potential to Achieve Goals: Good ADL Goals Pt Will Perform Grooming: with supervision;standing Pt Will Perform Lower Body Bathing: with supervision;with adaptive equipment;sit to/from stand Pt Will Perform Lower Body Dressing: with supervision;with adaptive equipment;sit to/from stand Pt Will Transfer to Toilet: with supervision;ambulating;bedside commode Pt Will Perform Toileting - Clothing Manipulation and hygiene: with supervision;sit to/from stand Pt Will Perform Tub/Shower Transfer: Tub transfer;with supervision;ambulating;rolling walker  OT Frequency: Min 2X/week    End of Session Equipment Utilized During Treatment: Gait belt;Rolling walker Nurse Communication: Mobility status  Activity Tolerance: Patient limited by fatigue Patient left: in chair;with call bell/phone within reach   Time: 1729-1803 OT Time Calculation (min): 34 min Charges:  OT General Charges $OT Visit: 1 Procedure OT Evaluation $Initial OT Evaluation Tier I: 1 Procedure OT Treatments $Self Care/Home Management : 8-22 mins $Therapeutic Activity: 8-22 mins G-Codes: OT G-codes **NOT FOR INPATIENT CLASS** Functional Assessment Tool Used: clinical judgment Functional Limitation: Self care Self Care Current Status (B3794): At least 20 percent but less than 40 percent impaired, limited or restricted Self Care Goal Status (F2761): At least 1 percent but less than 20 percent impaired, limited or restricted   Sarah [name] 12/11/2013, 7:09 PM  Cyndie Chime, OTR/L Occupational Therapist [phone]

## 2013-12-11 NOTE — Progress Notes (Signed)
FMTS ATTENDING  NOTE Latha Staunton,MD I  have seen and examined this patient, reviewed their chart. I have discussed this patient with the resident. I agree with the resident's findings, assessment and care plan. 

## 2013-12-11 NOTE — Evaluation (Signed)
Physical Therapy Evaluation Patient Details Name: Sarah [name] MRN: [id number] DOB: 11/26/1965 Today's Date: 12/11/2013   History of Present Illness  48 y.o. admitted with intractable back pain.  CT is unremarkable showing only DJD.  Clinical Impression  Pt admitted with above.  PTA she was independent with mobility.  On eval, min to mod assist required for bed mobility and transfers.  She ambulated 20 feet with RW with +2 assist for safety.  Pt demo steppage gait and distance was limited by fatigue.  Pt only reporting 2/10 pain with mobility.  Pt would benefit from skilled PT intervention to increase independence with functional mobility and ensure safe d/c home with family.  Recommend OPPT and RW upon d/c.    Follow Up Recommendations Outpatient PT;Supervision for mobility/OOB    Equipment Recommendations  Rolling walker with 5" wheels    Recommendations for Other Services       Precautions / Restrictions Precautions Precautions: Fall      Mobility  Bed Mobility Overal bed mobility: Needs Assistance Bed Mobility: Rolling;Sidelying to Sit Rolling: Min assist Sidelying to sit: Mod assist       General bed mobility comments: instructed in logroll for comfort/pain management  Transfers Overall transfer level: Needs assistance Equipment used: Rolling walker (2 wheeled) Transfers: Sit to/from Stand Sit to Stand: Min assist         General transfer comment: verbal cues for hand placement  Ambulation/Gait Ambulation/Gait assistance: +2 safety/equipment;Min assist Ambulation Distance (Feet): 20 Feet Assistive device: Rolling walker (2 wheeled) Gait Pattern/deviations: Steppage Gait velocity: decreased Gait velocity interpretation: Below normal speed for age/gender General Gait Details: distance limited by fatigue  Stairs            Wheelchair Mobility    Modified Rankin (Stroke Patients Only)       Balance                                              Pertinent Vitals/Pain Pain Assessment: 0-10 Pain Score: 2  Pain Location: low back Pain Intervention(s): Monitored during session    Home Living Family/patient expects to be discharged to:: Private residence Living Arrangements: Parent Available Help at Discharge: Family Type of Home: House Home Access: Stairs to enter Entrance Stairs-Rails: None Entrance Stairs-Number of Steps: 2 (deep enough for all 4 legs of RW Home Layout: One level Home Equipment: None      Prior Function Level of Independence: Independent               Hand Dominance        Extremity/Trunk Assessment   Upper Extremity Assessment: Overall WFL for tasks assessed           Lower Extremity Assessment: Generalized weakness         Communication   Communication: No difficulties  Cognition Arousal/Alertness: Awake/alert Behavior During Therapy: WFL for tasks assessed/performed Overall Cognitive Status: Within Functional Limits for tasks assessed                      General Comments      Exercises        Assessment/Plan    PT Assessment Patient needs continued PT services  PT Diagnosis Difficulty walking;Acute pain;Generalized weakness   PT Problem List Decreased activity tolerance;Decreased balance;Decreased mobility;Pain;Decreased knowledge of use of DME  PT Treatment Interventions DME instruction;Gait  training;Stair training;Functional mobility training;Therapeutic activities;Therapeutic exercise;Patient/family education;Balance training   PT Goals (Current goals can be found in the Care Plan section) Acute Rehab PT Goals Patient Stated Goal: not stated PT Goal Formulation: With patient Time For Goal Achievement: 12/25/13 Potential to Achieve Goals: Good    Frequency Min 3X/week   Barriers to discharge        Co-evaluation               End of Session Equipment Utilized During Treatment: Gait belt Activity Tolerance: Patient limited  by fatigue Patient left: in chair;with family/visitor present;with call bell/phone within reach Nurse Communication: Mobility status    Functional Assessment Tool Used: clinical judgement Functional Limitation: Mobility: Walking and moving around Mobility: Walking and Moving Around Current Status [phone]): At least 20 percent but less than 40 percent impaired, limited or restricted Mobility: Walking and Moving Around Goal Status [phone]): At least 1 percent but less than 20 percent impaired, limited or restricted    Time: 1003-1020 PT Time Calculation (min): 17 min   Charges:   PT Evaluation $Initial PT Evaluation Tier I: 1 Procedure PT Treatments $Gait Training: 8-22 mins   PT G Codes:   Functional Assessment Tool Used: clinical judgement Functional Limitation: Mobility: Walking and moving around    Lorriane Shire 12/11/2013, 1:54 PM

## 2013-12-11 NOTE — Progress Notes (Signed)
Utilization Review Completed.   Tytianna Greenley, RN, BSN Nurse Case Manager  

## 2013-12-12 ENCOUNTER — Inpatient Hospital Stay (HOSPITAL_COMMUNITY)
Admission: EM | Admit: 2013-12-12 | Discharge: 2014-01-19 | DRG: 456 | Disposition: A | Discharge status: Rehabilitation Facility | Payer: Medicaid Other | Attending: Family Medicine | Admitting: Family Medicine

## 2013-12-12 ENCOUNTER — Encounter (HOSPITAL_COMMUNITY): Payer: Self-pay | Admitting: Emergency Medicine

## 2013-12-12 DIAGNOSIS — D34 Benign neoplasm of thyroid gland: Secondary | ICD-10-CM | POA: Diagnosis present

## 2013-12-12 DIAGNOSIS — E282 Polycystic ovarian syndrome: Secondary | ICD-10-CM | POA: Diagnosis present

## 2013-12-12 DIAGNOSIS — M5459 Other low back pain: Secondary | ICD-10-CM

## 2013-12-12 DIAGNOSIS — D492 Neoplasm of unspecified behavior of bone, soft tissue, and skin: Secondary | ICD-10-CM

## 2013-12-12 DIAGNOSIS — C801 Malignant (primary) neoplasm, unspecified: Secondary | ICD-10-CM

## 2013-12-12 DIAGNOSIS — J95821 Acute postprocedural respiratory failure: Secondary | ICD-10-CM

## 2013-12-12 DIAGNOSIS — R339 Retention of urine, unspecified: Secondary | ICD-10-CM | POA: Diagnosis not present

## 2013-12-12 DIAGNOSIS — C3490 Malignant neoplasm of unspecified part of unspecified bronchus or lung: Secondary | ICD-10-CM | POA: Diagnosis present

## 2013-12-12 DIAGNOSIS — Z8639 Personal history of other endocrine, nutritional and metabolic disease: Secondary | ICD-10-CM

## 2013-12-12 DIAGNOSIS — L680 Hirsutism: Secondary | ICD-10-CM | POA: Diagnosis present

## 2013-12-12 DIAGNOSIS — Z7401 Bed confinement status: Secondary | ICD-10-CM

## 2013-12-12 DIAGNOSIS — G8220 Paraplegia, unspecified: Secondary | ICD-10-CM

## 2013-12-12 DIAGNOSIS — Z452 Encounter for adjustment and management of vascular access device: Secondary | ICD-10-CM

## 2013-12-12 DIAGNOSIS — M549 Dorsalgia, unspecified: Secondary | ICD-10-CM | POA: Diagnosis present

## 2013-12-12 DIAGNOSIS — T8351XA Infection and inflammatory reaction due to indwelling urinary catheter, initial encounter: Secondary | ICD-10-CM | POA: Diagnosis not present

## 2013-12-12 DIAGNOSIS — G9529 Other cord compression: Secondary | ICD-10-CM | POA: Diagnosis present

## 2013-12-12 DIAGNOSIS — M545 Low back pain: Secondary | ICD-10-CM

## 2013-12-12 DIAGNOSIS — E041 Nontoxic single thyroid nodule: Secondary | ICD-10-CM | POA: Diagnosis present

## 2013-12-12 DIAGNOSIS — M7580 Other shoulder lesions, unspecified shoulder: Secondary | ICD-10-CM | POA: Diagnosis present

## 2013-12-12 DIAGNOSIS — E1165 Type 2 diabetes mellitus with hyperglycemia: Secondary | ICD-10-CM | POA: Diagnosis present

## 2013-12-12 DIAGNOSIS — C7951 Secondary malignant neoplasm of bone: Principal | ICD-10-CM | POA: Diagnosis present

## 2013-12-12 DIAGNOSIS — I10 Essential (primary) hypertension: Secondary | ICD-10-CM | POA: Diagnosis present

## 2013-12-12 DIAGNOSIS — D6869 Other thrombophilia: Secondary | ICD-10-CM | POA: Diagnosis present

## 2013-12-12 DIAGNOSIS — M546 Pain in thoracic spine: Secondary | ICD-10-CM

## 2013-12-12 DIAGNOSIS — C73 Malignant neoplasm of thyroid gland: Secondary | ICD-10-CM | POA: Diagnosis present

## 2013-12-12 DIAGNOSIS — I2699 Other pulmonary embolism without acute cor pulmonale: Secondary | ICD-10-CM | POA: Diagnosis present

## 2013-12-12 DIAGNOSIS — M8458XA Pathological fracture in neoplastic disease, other specified site, initial encounter for fracture: Secondary | ICD-10-CM | POA: Diagnosis present

## 2013-12-12 DIAGNOSIS — C799 Secondary malignant neoplasm of unspecified site: Secondary | ICD-10-CM

## 2013-12-12 DIAGNOSIS — N390 Urinary tract infection, site not specified: Secondary | ICD-10-CM

## 2013-12-12 DIAGNOSIS — Z6841 Body Mass Index (BMI) 40.0 and over, adult: Secondary | ICD-10-CM

## 2013-12-12 DIAGNOSIS — Z8249 Family history of ischemic heart disease and other diseases of the circulatory system: Secondary | ICD-10-CM

## 2013-12-12 DIAGNOSIS — R29898 Other symptoms and signs involving the musculoskeletal system: Secondary | ICD-10-CM

## 2013-12-12 DIAGNOSIS — Y738 Miscellaneous gastroenterology and urology devices associated with adverse incidents, not elsewhere classified: Secondary | ICD-10-CM | POA: Diagnosis not present

## 2013-12-12 HISTORY — DX: Personal history of other diseases of the digestive system: Z87.19

## 2013-12-12 HISTORY — DX: Other shoulder lesions, unspecified shoulder: M75.80

## 2013-12-12 LAB — BASIC METABOLIC PANEL
Anion gap: 13 (ref 5–15)
BUN: 14 mg/dL (ref 6–23)
CHLORIDE: 96 meq/L (ref 96–112)
CO2: 30 mEq/L (ref 19–32)
Calcium: 9.1 mg/dL (ref 8.4–10.5)
Creatinine, Ser: 0.85 mg/dL (ref 0.50–1.10)
GFR calc non Af Amer: 80 mL/min — ABNORMAL LOW (ref >90)
Glucose, Bld: 113 mg/dL — ABNORMAL HIGH (ref 70–99)
POTASSIUM: 3.9 meq/L (ref 3.7–5.3)
SODIUM: 139 meq/L (ref 137–147)

## 2013-12-12 LAB — CBC
HCT: 34.4 % — ABNORMAL LOW (ref 36.0–46.0)
Hemoglobin: 11.6 g/dL — ABNORMAL LOW (ref 12.0–15.0)
MCH: 28.4 pg (ref 26.0–34.0)
MCHC: 33.7 g/dL (ref 30.0–36.0)
MCV: 84.3 fL (ref 78.0–100.0)
PLATELETS: 306 10*3/uL (ref 150–400)
RBC: 4.08 MIL/uL (ref 3.87–5.11)
RDW: 14 % (ref 11.5–15.5)
WBC: 8.9 10*3/uL (ref 4.0–10.5)

## 2013-12-12 MED ORDER — ACETAMINOPHEN 325 MG PO TABS
650.0000 mg | ORAL_TABLET | Freq: Four times a day (QID) | ORAL | Status: DC | PRN
  Administered 2013-12-12 (×3): 650 mg via ORAL
  Filled 2013-12-12 (×3): qty 2

## 2013-12-12 MED ORDER — CYCLOBENZAPRINE HCL 5 MG PO TABS: 5.0000 mg | ORAL_TABLET | Freq: Three times a day (TID) | ORAL | Status: DC | PRN

## 2013-12-12 MED ORDER — DOCUSATE SODIUM 100 MG PO CAPS: 100.0000 mg | ORAL_CAPSULE | Freq: Two times a day (BID) | ORAL | Status: DC

## 2013-12-12 MED ORDER — DIAZEPAM 5 MG PO TABS
5.0000 mg | ORAL_TABLET | Freq: Once | ORAL | Status: AC
  Administered 2013-12-13: 5 mg via ORAL
  Filled 2013-12-12: qty 1

## 2013-12-12 MED ORDER — POLYETHYLENE GLYCOL 3350 17 G PO PACK
17.0000 g | PACK | Freq: Every day | ORAL | Status: DC | PRN
  Administered 2013-12-12: 17 g via ORAL
  Filled 2013-12-12: qty 1

## 2013-12-12 MED ORDER — LOSARTAN POTASSIUM 25 MG PO TABS: 12.5000 mg | ORAL_TABLET | Freq: Every day | ORAL | Status: DC

## 2013-12-12 NOTE — Progress Notes (Signed)
Physical Therapy Treatment Patient Details Name: Sarah [name] MRN: [id number] DOB: 1965/06/17 Today's Date: 12/12/2013    History of Present Illness 48 y.o. admitted with intractable back pain.  CT is unremarkable showing only DJD.    PT Comments    Patient able to complete stair training this afternoon. Would benefit from more practice but according to MD note, patient to DC today. Patient given handout for steps and was educated on technique. Patient will have several family members to assist at home. Will continue with current POC at this time.   Follow Up Recommendations  Home health PT;Supervision for mobility/OOB     Equipment Recommendations  Rolling walker with 5" wheels (wide)    Recommendations for Other Services       Precautions / Restrictions Precautions Precautions: Fall Restrictions Weight Bearing Restrictions: No    Mobility  Bed Mobility               General bed mobility comments: in chair on arrival  Transfers Overall transfer level: Needs assistance Equipment used: Rolling walker (2 wheeled) Transfers: Sit to/from Stand Sit to Stand: Min assist         General transfer comment: good hand placement. Needed (A) to power up into full upright posture  Ambulation/Gait Ambulation/Gait assistance: Min assist Ambulation Distance (Feet): 20 Feet Assistive device: Rolling walker (2 wheeled) Gait Pattern/deviations: Step-to pattern Gait velocity: decreased Gait velocity interpretation: Below normal speed for age/gender General Gait Details: Patient cues for posture and safe use of RW. Min A to ensure balance.    Stairs Stairs: Yes Stairs assistance: Min assist Stair Management: With walker;Backwards;No rails;Step to pattern Number of Stairs: 2 General stair comments: + 2 min A for balance and positioning of RW. Cues for technique and sequency  Wheelchair Mobility    Modified Rankin (Stroke Patients Only)       Balance Overall  balance assessment: Needs assistance         Standing balance support: Bilateral upper extremity supported;During functional activity Standing balance-Leahy Scale: Fair Standing balance comment: requires min (A)                     Cognition Arousal/Alertness: Awake/alert Behavior During Therapy: WFL for tasks assessed/performed Overall Cognitive Status: Within Functional Limits for tasks assessed                      Exercises      General Comments        Pertinent Vitals/Pain Pain Assessment: No/denies pain Pain Score: 2  Pain Location: low back/legs Pain Descriptors / Indicators: Aching;Sore Pain Intervention(s): Repositioned    Home Living                      Prior Function            PT Goals (current goals can now be found in the care plan section) Acute Rehab PT Goals Patient Stated Goal: to have my back feel better Progress towards PT goals: Progressing toward goals    Frequency  Min 3X/week    PT Plan Discharge plan needs to be updated    Co-evaluation             End of Session Equipment Utilized During Treatment: Gait belt Activity Tolerance: Patient limited by fatigue;Patient tolerated treatment well Patient left: in chair;with family/visitor present;with call bell/phone within reach     Time: 1330-1347 PT Time Calculation (min): 17 min  Charges:  $Gait Training: 8-22 mins $Therapeutic Activity: 8-22 mins                    G Codes:      Jacqualyn Posey 12/12/2013, 2:05 PM 12/12/2013 Jacqualyn Posey PTA [phone] pager [phone] office

## 2013-12-12 NOTE — H&P (Signed)
[facility] Admission History and Physical Service Pager: [phone]  Patient name: Sarah [name] Medical record number: [id number] Date of birth: 1966/01/20 Age: 48 y.o. Gender: female  Primary Care Provider: No PCP Per Patient Consultants: Neurology Code Status: Full  Chief Complaint: Lower extremity weakness  Assessment and Plan: Sarah [name] is a 48 y.o. female presenting with continued lower extremity weakness at baseline after discharge from hospital on 9/21. PMH is significant for obesity, HTN, and ?History of Hypothyroidism previously on Synthroid per patient.   #Acute on chronic LBP with inability to stand- At this point etiology continues to remain unclear though most likely dx is some nerve root compression with lumbar lateral recess and foraminal narrowing seen on 9/19 lumbar MRI, with contribution from obesity and psychological process. She has had chronic back pain, progressively worsening since 9/16 with lower extremity paresthesias extending subjectively up to the level of the upper abdomen, pt unable to bear weight vs true balance disorder. This occurs in the setting of known DDD with potential nerve root compression (history of chronic back pain, but unclear whether this is acute vs chronic, weight likely contributing) Vs. Potential prodromal viral illness (though not convincing based on history), or other infectious etiology (no abscess visualized on previous CT/MRI of spine, no fever, no white count); no acute trauma (other than pulled mm 6 weeks ago); at this point also consider potential intracranial pathology (exam at prior admission with equiv babinski's but no hyperreflexia, nml cerebellar testing nml CN and UE, would be an unusual presentation of MS given isolated event, ?NPH given subjective incontinence and difficulty with ambulation, ?Abnormal late stage presentation of genetic disorder i.e. Adrenomyeloneuropathy) vs thoracic spine compression  (pain mostly located in this area however not location of peripheral roots enervating legs, would be abnormal; No dermatomal level in abdomen) vs demyelinating process (neg spinal MRI) vs mm spasm and body habitus, History of ?Hypothyroidism (enlarged / full thyroid noted on examination, will check TSH), Will additionally check B12 level, though this is an unlikely presentation of this as well. Rather acute to be a polyneuropathy presentation (i.e. GBS). Must also consider myelopathy (with paraparesia, sensory level, ?bowel incontinence. However, no obvious UMN signs and unlikely per neuro recent eval last hospitalization. -Admit to obs under Dr Nori Riis -PT/OT consult  - Will reconsult neurology in the am regarding need for ongoing work up. I.e. EMG.  - Consider ortho consult given spinal foraminal narrowing on MRI.  - Will discuss MRI brain to evaluate for central process though unlikely with normal mental status and cranial nerve testing. - flexeril for mm spasm  - i/o cath or bed pan if able while unable to walk   - bed rest or UOB with at least a 2 assist  - A1c for potential neuropathy  - TSH, B12 Level, Mg, Phos pending.  - will also check random cortisol and ESR. - Continuous pulse oximetry with mild concern for GBS.  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 PCOS vs. Adrenal pathology?[name] Kitchen Would be unlike to be first presentation of HPA pathology; no evidence of salt wasting on labs. Hypertensive and not hypotensive. Testing during this hospitalization after having recently received steroids would be largely non-diagnostic. Though, could also be a combination of isolated processes i.e. Clitoromegaly, Hirsutism 2/2 obesity. Will need ongoing outpatient evaluation.  - Recent thought of outpt vs inpt w/up including [place] to assess ovaries vs 21-hydroxylase/ACTH/cortisol.  -  Checking A1C, TSH, random cortisol  #HTN- BP largely  uncontrolled 150s-180s/70s-90s; previously on BP med that made her feel badly? Unclear what kind  -started with losartan 12.18m at last admission.  -continue losartan here. -vitals per floor protocol   #Obesity- believe this is largely contributing to ambulation issues  -needs aggressive dietary and exercise intervention   FEN/GI: Reg diet; KVO; no stool in 4-5 days with large stool burden and moderate abdominal distension on exam - miralax daily Prophylaxis: SCD's for now.   Disposition: Admit to Med Surg for observation. Discuss again with neurology. Would largely benefit from home PT and ongoing outpatient evaluation also with neurology and a PCP. Will consult SW. Pt also stated her family member is helping her look into medicaid which she thinks she qualifies for.  History of Present Illness: Sarah MAZOis a 48y.o. female presenting with recurrence of lower extremity weakness after discharge from the hospital. She states that she was discharged, and upon arriving at home she says that she attempted to get out of the car with multiple family members' help. She was unable to get her legs out of the car on her own. Upon attempting to stand she says that she was unable to, and she felt "shooting pains" down her legs. She says that she was unable to make it to her house from the car despite help from her family members. She says that she had no choice but to call EMS to return to the ED. She says that she continues to have the same symptoms that she had when she left the hospital, including numbness from the level of her upper abdomen to her feet, including he perineum/ genitalia. She says that she has been this "numb" since 9/17, at which time her feet, legs, and groin area were "tingling". She went to the ED on 9/18, and she says that she was given pain medication for her symptoms and was discharged home. The pain medication made her feel nauseated and she had some vomiting. At that time 9/19 she  reports subsequently having her legs "give out" on her. She then had to call EMS to come here on 9/19. She was seen by neurology who thought this was unlikely a myelopathy and discouraged further imaging and workup. She subsequently improved during her hospital stay without intervention, and with PT / OT. However, she says that she did not "feel that she was ready to leave." Plan was to continue rehabilitation at home but with no insurance she was unable to set this up. She overall does not endorse any change in her symptoms since discharge earlier on 9/21. She denies loss of bowel or bladder function, she denies trauma or new heavy lifting, she states that her back pain is still primarily located in her mid-upper back. She endorses constipation with no stool in 4-5 days, and upon further questioning also endorses ?history of hypothyroidism. She also denies any recent febrile illness. She does report lots of stressors "all the time" but no new acute psychological stressor.  Review Of Systems: Per HPI with the following additions: None Otherwise 12 point review of systems was performed and was unremarkable.  Patient Active Problem List   Diagnosis Date Noted  . Intractable low back pain 12/10/2013  . Back pain 12/10/2013   Past Medical History: Past Medical History  Diagnosis Date  . Hypertension   . DDD (degenerative disc disease), cervical   . DJD (degenerative joint disease)   . Obesity   .  Chronic back pain    Past Surgical History: Past Surgical History  Procedure Laterality Date  . Tonsillectomy     Social History: History  Substance Use Topics  . Smoking status: Never Smoker   . Smokeless tobacco: Not on file  . Alcohol Use: No  Denies alcohol use  Additional social history: None  Please also refer to relevant sections of EMR.  Family History: Family History  Problem Relation Age of Onset  . Hypertension Mother   . Diabetes Mother   . Hypertension Father   . Stroke  Father    Allergies and Medications: Allergies  Allergen Reactions  . Tramadol Nausea And Vomiting   No current facility-administered medications on file prior to encounter.   Current Outpatient Prescriptions on File Prior to Encounter  Medication Sig Dispense Refill  . albuterol (PROVENTIL HFA;VENTOLIN HFA) 108 (90 BASE) MCG/ACT inhaler Inhale 2 puffs into the lungs every 6 (six) hours as needed for wheezing or shortness of breath.      [name] Kitchen ibuprofen (ADVIL,MOTRIN) 200 MG tablet Take 600 mg by mouth every 6 (six) hours as needed for mild pain.       . cyclobenzaprine (FLEXERIL) 5 MG tablet Take 1 tablet (5 mg total) by mouth 3 (three) times daily as needed for muscle spasms.  30 tablet  0  . diclofenac (VOLTAREN) 75 MG EC tablet Take 1 tablet (75 mg total) by mouth 2 (two) times daily.  14 tablet  0  . HYDROcodone-acetaminophen (NORCO/VICODIN) 5-325 MG per tablet Take 1 tablet by mouth every 4 (four) hours as needed.  20 tablet  0  . losartan (COZAAR) 25 MG tablet Take 0.5 tablets (12.5 mg total) by mouth daily.  30 tablet  0    Objective: BP 162/88  Pulse 79  Temp(Src) 98.3 F (36.8 C) (Oral)  Resp 19  Ht 5' 11"  (1.803 m)  Wt 315 lb (142.883 kg)  BMI 43.95 kg/m2  SpO2 99%  LMP 11/28/2013 Exam: General: NAD, AAOx3, morbidly obese HEENT: EOMI, Thyromegaly to exam without tenderness, no LAD, neck supple Abdomen: Soft, nontender, moderately distended, rectal exam with large hard stool burden Extremities: 2+ posterior tibial pulses bilaterally, 2+ radial pulses bilaterally, no gross abnormalities noted. Somewhat cool bilateral feet.  Skin: No lesions or rashes noted Neuro: CNII-XII intact, Normal speech, Motor: 2-/5 motor weakness in bilateral lower extremities grossly, 5/5 motor BL upper extremities, decreased sensation to light touch and pressure from upper abdomen down through bilateral lower extremities into feet, No saddle anesthesia, full rectal tone, Unable to assess gait due to  pt. inability to ambulate at this time.   Labs and Imaging: CBC BMET   Recent Labs Lab 12/12/13 1310  WBC 8.9  HGB 11.6*  HCT 34.4*  PLT 306    Recent Labs Lab 12/12/13 1310  NA 139  K 3.9  CL 96  CO2 30  BUN 14  CREATININE 0.85  GLUCOSE 113*  CALCIUM 9.1     Imaging/Diagnostic Tests:  CT lumbar spine 12/09/13  Spinal stenosis at L3-4 and L4-5, multifactorial. Borderline spinal stenosis at L2-3. 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 Multilevel arthropathy. No frank disc extrusion. No fracture or spondylolisthesis.   MRI lumbar spine 12/10/2013  Degenerative disc disease and degenerative facet disease in the lower lumbar spine that could be a cause of lumbago. Lateral recess and foraminal narrowing at L3-4, L4-5 and L5-S1 as described above that could  be a cause of focal neural compression. In general, this is more pronounced on the left than on the right. In particular, there is potential for left S1 nerve root compression in the subarticular lateral recess at L5-S1.    Aquilla Hacker, MD 12/12/2013, 11:53 PM PGY-1, [facility] pager: [phone], text pages welcome   I have seen and examined the patient with Dr Minda Ditto and agree with his assessment and plan, with my changes in blue. Hilton Sinclair, MD 12/13/2013 6:43 AM PGY-3, [place]

## 2013-12-12 NOTE — ED Notes (Signed)
Pt placed into gown and on monitor upon arrival to room. Pt monitored by blood pressure, pulse ox, and 5 lead.

## 2013-12-12 NOTE — ED Notes (Signed)
Per EMS- pt d/c'd earlier for HTN and back pain. Pt called EMS for a fall related to back pain/weakness. PT requested to come back.

## 2013-12-12 NOTE — Progress Notes (Signed)
Occupational Therapy Treatment Patient Details Name: Sarah [name] MRN: [id number] DOB: 17-Nov-1965 Today's Date: 12/12/2013    History of present illness 48 y.o. admitted with intractable back pain.  CT is unremarkable showing only DJD.   OT comments  Pt is to sponge bath only with d/c home due to inability to complete transfer this date safely. Pt educated to have family (A) while in acute care with staff (A) to help prepare of d/c home. Pt requires (A) for basic chair transfer at this time. Pt educated on bowel program and placing snacks near sitting area at home so that patient care meet basic needs while home alone. Pt will have help PRN throughout the day but will not have 24/ 7 (A).    Follow Up Recommendations  No OT follow up;Supervision/Assistance - 24 hour    Equipment Recommendations  Other (comment) (currently has BSC at home per patient)    Recommendations for Other Services      Precautions / Restrictions Precautions Precautions: Fall       Mobility Bed Mobility               General bed mobility comments: in chair on arrival  Transfers Overall transfer level: Needs assistance Equipment used: Rolling walker (2 wheeled) Transfers: Sit to/from Stand Sit to Stand: Min assist         General transfer comment: good hand placement. Needed (A) to power up into full upright posture    Balance Overall balance assessment: Needs assistance         Standing balance support: Bilateral upper extremity supported;During functional activity Standing balance-Leahy Scale: Fair Standing balance comment: requires min (A)                    ADL Overall ADL's : Needs assistance/impaired                                 Tub/ Shower Transfer: +2 for physical assistance;Maximal assistance Tub/Shower Transfer Details (indicate cue type and reason): pt was unsuccessful at [facility] and tub bench transfer. Pt will not be eligible for home health services  and would have to buy DME out of pocket. Pt is unable to purchase at this time. Recommending that patient complete sponge bath only using BSC that is currently at home. Functional mobility during ADLs: Moderate assistance;Rolling walker General ADL Comments: Pt is to sponge bath at this time due to unsafe use of DME and tub transfer. Pt fatigued with minimal ambulation and fall risk.       Vision                     Perception     Praxis      Cognition   Behavior During Therapy: WFL for tasks assessed/performed Overall Cognitive Status: Within Functional Limits for tasks assessed                       Extremity/Trunk Assessment               Exercises     Shoulder Instructions       General Comments      Pertinent Vitals/ Pain       Pain Assessment: No/denies pain Pain Score: 2  Pain Location: low back Pain Descriptors / Indicators: Aching Pain Intervention(s): Repositioned  Home Living  Prior Functioning/Environment              Frequency Min 2X/week     Progress Toward Goals  OT Goals(current goals can now be found in the care plan section)  Progress towards OT goals: Progressing toward goals  Acute Rehab OT Goals Patient Stated Goal: to have my back feel better OT Goal Formulation: With patient Time For Goal Achievement: 12/25/13 Potential to Achieve Goals: Good ADL Goals Pt Will Perform Grooming: with supervision;standing Pt Will Perform Lower Body Bathing: with supervision;with adaptive equipment;sit to/from stand Pt Will Perform Lower Body Dressing: with supervision;with adaptive equipment;sit to/from stand Pt Will Transfer to Toilet: with supervision;ambulating;bedside commode Pt Will Perform Toileting - Clothing Manipulation and hygiene: with supervision;sit to/from stand Pt Will Perform Tub/Shower Transfer: Tub transfer;with supervision;ambulating;rolling walker   Plan Discharge plan remains appropriate    Co-evaluation                 End of Session Equipment Utilized During Treatment: Gait belt;Rolling walker   Activity Tolerance Patient tolerated treatment well   Patient Left in chair;with call bell/phone within reach;with family/visitor present   Nurse Communication Mobility status;Precautions        Time: 7096-2836 OT Time Calculation (min): 23 min  Charges: OT General Charges $OT Visit: 1 Procedure OT Treatments $Self Care/Home Management : 23-37 mins  Peri Maris 12/12/2013, 1:43 PM Pager: [phone]

## 2013-12-12 NOTE — Discharge Summary (Signed)
[facility] Discharge Summary  Patient name: Sarah [name] Medical record number: [id number] Date of birth: [birth year] Age: 48 y.o. Gender: female Date of Admission: 12/09/2013  Date of Discharge: 12/12/2013  Admitting Physician: Andrena Mews, MD  Primary Care Provider: No PCP Per Patient Consultants: Neurology  Indication for Hospitalization: Lower Extremity Weakness, unable to bear weight  Discharge Diagnoses/Problem List:  Acute on chronic low back pain, hypervirilization, hypertension, obesity  Disposition: Home  Discharge Condition: Improved  Discharge Exam: See progress note for 12/12/2013  Brief Hospital Course:  Sarah [name] is a 48 y.o. female who presented with acute on chronic LBP and LE weakness with inability to stand. PMH is significant for HTN and obesity.  Her course by problem is listed below:  #Acute on chronic LBP with inability to stand- Differential at the time od admission included demylinating process vs nerve root compression. Less like etiologies included infectious (no abscess on CT, no fever, no leuckocytosis) or intracranial pathology (not likely given lack of hyperreflexia, normal cerebellar testing, normal cranial nerves and upper extremities). MRI obtained here showed degenerative facet and disc disease with some foraminal narrowing. Neurology was consulted and did not think that the patient's presentation was consistent with acute myelopathy or radiculopathy and did not recommend any further evaluation. She was given ibuprofen and flexeril for pain control and muscle spasms. Patient worked with physical therapy and was gradually able become mobile and was eventually able to ambulate with the assistance of a walker with no further treatment or intervention. Home health PT was recommended however this was unable to be set up at the time of discharge as the patient was uninsured and was not able to be scheduled with [facility].  #Hypervirilization - Patient was noted to have clitoromegaly and buffalo hump and obesity as well as upper lip hair on admission. Potentially due to PCOS. Given that the patient recently received steroids (for COPD exacerbation), further testing was deferred to be worked up as an outpatient.   #HTN- BP largely uncontrolled on admission to150s-180s/70s-90s. Patient reported that she was previous on a BP medication that her feel badly, but could not remember what kind. We started losartan 12.5mg  here.    #Obesity- Likely largely contributing to ambulation issues. No intervention while here. Will likely need aggressive dietary and exercise intervention as an outpatient.   Issues for Follow Up:  1) f/u ambulation and lower extremity weakness, patient would likely benefit from PT, though may be difficult to set up given uninsured status 2) f/u BP - Started losartan 12.5mg  here, may need to titrate up 3) consider obtaining A1C as outpatient as patient is at risk for diabetes and may have diabetic neuropathy playing a role in presentation 4) Consider work up for PCOS given hypervirilization    Significant Procedures: None  Significant Labs and Imaging:   Recent Labs Lab 12/10/13 0950 12/12/13 1310  WBC 9.4 8.9  HGB 11.6* 11.6*  HCT 34.5* 34.4*  PLT 211 306    Recent Labs Lab 12/10/13 0950 12/12/13 1310  NA 136* 139  K 3.8 3.9  CL 95* 96  CO2 28 30  GLUCOSE 138* 113*  BUN 18 14  CREATININE 0.87 0.85  CALCIUM 8.9 9.1   Ct Lumbar Spine Wo Contrast 12/09/2013    IMPRESSION: Spinal stenosis at L3-4 and L4-5, multifactorial. Borderline spinal stenosis at L2-3. 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  Multilevel arthropathy.  No frank disc extrusion.  No fracture or spondylolisthesis.     Mr Lumbar Spine Wo Contrast 12/10/2013    IMPRESSION: Degenerative disc disease and degenerative facet disease in  the lower lumbar spine that could be a cause of lumbago.  Lateral recess and foraminal narrowing at L3-4, L4-5 and L5-S1 as described above that could be a cause of focal neural compression. In general, this is more pronounced on the left than on the right. In particular, there is potential for left S1 nerve root compression in the subarticular lateral recess at L5-S1.     Results/Tests Pending at Time of Discharge: None  Discharge Medications:    Medication List    STOP taking these medications       azithromycin 250 MG tablet  Commonly known as:  ZITHROMAX     predniSONE 20 MG tablet  Commonly known as:  DELTASONE      TAKE these medications       albuterol 108 (90 BASE) MCG/ACT inhaler  Commonly known as:  PROVENTIL HFA;VENTOLIN HFA  Inhale 2 puffs into the lungs every 6 (six) hours as needed for wheezing or shortness of breath.     cyclobenzaprine 5 MG tablet  Commonly known as:  FLEXERIL  Take 1 tablet (5 mg total) by mouth 3 (three) times daily as needed for muscle spasms.     diazepam 5 MG tablet  Commonly known as:  VALIUM  1 po tid for spasm pain     diclofenac 75 MG EC tablet  Commonly known as:  VOLTAREN  Take 1 tablet (75 mg total) by mouth 2 (two) times daily.     HYDROcodone-acetaminophen 5-325 MG per tablet  Commonly known as:  NORCO/VICODIN  Take 1 tablet by mouth every 4 (four) hours as needed.     ibuprofen 200 MG tablet  Commonly known as:  ADVIL,MOTRIN  Take 600 mg by mouth every 6 (six) hours as needed. pain     losartan 25 MG tablet  Commonly known as:  COZAAR  Take 0.5 tablets (12.5 mg total) by mouth daily.        Discharge Instructions: Please refer to Patient Instructions section of EMR for full details.  Patient was counseled important signs and symptoms that should prompt return to medical care, changes in medications, dietary instructions, activity restrictions, and follow up appointments.   Follow-Up Appointments: Follow-up  Information   Follow up with [facility]    . Call in 1 day.   Contact information:   [place] [address] [phone]      Dimas Chyle, MD 12/12/2013, 5:47 PM PGY-1, [place]

## 2013-12-12 NOTE — Care Management Note (Signed)
CARE MANAGEMENT NOTE 12/12/2013  Patient:  Sarah [name], Sarah [name]   Account Number:  [id number]  Date Initiated:  12/12/2013  Documentation initiated by:  Ricki Miller  Subjective/Objective Assessment:   48 yr old female admitted with intractable back pain.     Action/Plan:   Case manager spoke with patient concerning DME needs. She states her sister has 3in1 she will use. CM ordered Rolling walker. Patient not eligible for HHPT, CM explained this to Dr. Jerline Pain.   Anticipated DC Date:  12/12/2013   Anticipated DC Plan:  [place]  CM consult  [facility]      [facility] Choice  DURABLE MEDICAL EQUIPMENT   Choice offered to / List presented to:     DME arranged  Vassie Moselle      DME agency  [place] arranged  NA      Status of service:  In process, will continue to follow Medicare Important Message given?   (If response is "NO", the following Medicare IM given date fields will be blank) Date Medicare IM given:   Medicare IM given by:   Date Additional Medicare IM given:   Additional Medicare IM given by:    Discharge Disposition:    Per UR Regulation:  Reviewed for med. necessity/level of care/duration of stay  If discussed at [place] of Stay Meetings, dates discussed:    Comments:  12/12/13 4:04pm Ricki Miller, RN BSN Case Manager CM was unable to schedule patient with [facility] outpatient rehab, Informed Dr. Jerline Pain. [facility] to get appointment arranged for patient, instructed to call on 12/13/13 AM. Case Manager will make the call.

## 2013-12-12 NOTE — Progress Notes (Signed)
Physical Therapy Treatment Patient Details Name: Sarah [name] MRN: [id number] DOB: 1965-12-17 Today's Date: 12/12/2013    History of Present Illness 48 y.o. admitted with intractable back pain.  CT is unremarkable showing only DJD.    PT Comments    Patient progressing slowly with mobility. She still feels very weak with mobility which is evident. Per MD note, patient to DC today. Will need to attempt steps prior to DC home. RN aware. Will see later today for stair training  Follow Up Recommendations  Home health PT;Supervision for mobility/OOB     Equipment Recommendations  Rolling walker with 5" wheels (Wide RW)    Recommendations for Other Services       Precautions / Restrictions Precautions Precautions: Fall Restrictions Weight Bearing Restrictions: No    Mobility  Bed Mobility               General bed mobility comments: Patient up in recliner beofre and after session  Transfers Overall transfer level: Needs assistance Equipment used: Rolling walker (2 wheeled)   Sit to Stand: Min assist         General transfer comment: VC's for hand placement, min (A) to power up and stabilize RW. Increased time and effort by patient. multiple attempts for one stand.   Ambulation/Gait Ambulation/Gait assistance: Min assist Ambulation Distance (Feet): 60 Feet Assistive device: Rolling walker (2 wheeled) Gait Pattern/deviations: Step-through pattern;Decreased stride length;Trunk flexed;Steppage Gait velocity: decreased Gait velocity interpretation: Below normal speed for age/gender General Gait Details: Patient cues for posture and safe use of RW. Min A to ensure balance. Patient sat x2 for resting breaks.    Stairs            Wheelchair Mobility    Modified Rankin (Stroke Patients Only)       Balance                                    Cognition Arousal/Alertness: Awake/alert Behavior During Therapy: WFL for tasks  assessed/performed Overall Cognitive Status: Within Functional Limits for tasks assessed                      Exercises      General Comments        Pertinent Vitals/Pain Pain Score: 4  Pain Location: low back Pain Descriptors / Indicators: Aching Pain Intervention(s): Monitored during session;Limited activity within patient's tolerance    Home Living                      Prior Function            PT Goals (current goals can now be found in the care plan section) Progress towards PT goals: Progressing toward goals    Frequency  Min 3X/week    PT Plan Discharge plan needs to be updated    Co-evaluation             End of Session Equipment Utilized During Treatment: Gait belt Activity Tolerance: Patient limited by fatigue;Patient tolerated treatment well Patient left: in chair;with family/visitor present;with call bell/phone within reach     Time: 0931-1012 PT Time Calculation (min): 41 min  Charges:  $Gait Training: 23-37 mins $Therapeutic Activity: 8-22 mins                    G Codes:      Jacqualyn Posey 12/12/2013,  10:23 AM 12/12/2013 Jacqualyn Posey PTA [phone] pager [phone] office

## 2013-12-12 NOTE — Progress Notes (Signed)
Family Medicine Teaching Service Daily Progress Note Intern Pager: [phone]  Patient name: Sarah [name] Medical record number: [id number] Date of birth: Oct 06, 1965 Age: 48 y.o. Gender: female  Primary Care Provider: No PCP Per Patient Consultants: neuro Code Status: full  Pt Overview and Major Events to Date:  9/19 - admitted with LE weakness  Assessment and Plan: Sarah [name] is a 48 y.o. female presenting with LBP and LE weakness. PMH is significant for HTN   #Acute on chronic LBP with inability to stand- Unclear precipitant, progressively worsening, pt unable to bear weight vs true balance disorder; DDD with potential nerve root compression vs mm spasm and body habitus.  -PT/OT consult: Home PT, no OT  -neurology consulted, rec no further workup -pain control with ibuprofen  -flexeril for mm spasm  -up with assistance  #Hypervirilization - with clitoromegaly and buffalo hump and obesity as well as upper lip hair. Pt with questionable adverse reaction to prednisone. Likely all be related to a PCOS. Testing now that she has received steroids would be largely non-diagnostic at this point  - rec outpt w/up  #HTN- BP largely uncontrolled 150s-180s/70s-90s; previously on BP med that made her feel badly? Unclear what kind  - losartan 12.5mg  started 9/19 at 1500, will monitor on this before increasing further - vitals per floor protocol   #Obesity- believe this is largely contributing to ambulation issues  -needs aggressive dietary and exercise intervention   FEN/GI: Reg diet; KVO  Prophylaxis: HSQ  Disposition: Likely d/c today.  Subjective:  Was able to ambulate some with walker. Still feels weak in lower extremities. Concerned about going home today. No chest pain or shortness of breath.  Objective: Temp:  [97.6 F (36.4 C)-98.1 F (36.7 C)] 98.1 F (36.7 C) (09/21 0530) Pulse Rate:  [81-97] 97 (09/21 0530) Resp:  [16-18] 18 (09/21 0530) BP: (120-156)/(60-87) 154/84  mmHg (09/21 0530) SpO2:  [98 %-100 %] 100 % (09/21 0530) Physical Exam: General: lying on back in bed, NAD HEENT: thick supple neck; buffalo hump  Cardiovascular: RRR, nml s/1, no mrg  Respiratory: CTAB, normal WOB  Abdomen: obesity, SNTND, normactive bowel sounds Extremities: MSK strength 4/5 bilaterally in LE  Skin: no evidence of breakdown  Neuro: alert and oriented, no gross deficits, light touch sensation intact bilaterally   Laboratory:  Recent Labs Lab 12/10/13 0950  WBC 9.4  HGB 11.6*  HCT 34.5*  PLT 211    Recent Labs Lab 12/10/13 0950  NA 136*  K 3.8  CL 95*  CO2 28  BUN 18  CREATININE 0.87  CALCIUM 8.9  GLUCOSE 138*    Imaging/Diagnostic Tests: CT lumbar spine 12/09/13  Spinal stenosis at L3-4 and L4-5, multifactorial. Borderline spinal stenosis at L2-3. 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 Multilevel arthropathy. No frank disc extrusion. No fracture or spondylolisthesis.   MRI lumbar spine 12/10/2013  Degenerative disc disease and degenerative facet disease in the lower lumbar spine that could be a cause of lumbago. Lateral recess and foraminal narrowing at L3-4, L4-5 and L5-S1 as described above that could be a cause of focal neural compression. In general, this is more pronounced on the left than on the right. In particular, there is potential for left S1 nerve root compression in the subarticular lateral recess at L5-S1.   Dimas Chyle, MD 12/12/2013, 9:53 AM PGY-1, [place] Intern pager: [phone], text pages welcome

## 2013-12-12 NOTE — Progress Notes (Signed)
FMTS Attending Note Patient seen and examined by me, discussed with resident team and I agree with Dr Marigene Ehlers assessment and plan. [name] making progress in her PT.  Plans for home health PT, discharge to home today.  Dalbert Mayotte, MD

## 2013-12-12 NOTE — ED Provider Notes (Signed)
CSN: [id number]     Arrival date & time 12/12/13  2130 History   First MD Initiated Contact with Patient 12/12/13 2131     Chief Complaint  Patient presents with  . Back Pain     (Consider location/radiation/quality/duration/timing/severity/associated sxs/prior Treatment) [name] a 48 y.o. female presenting with back pain.  Back Pain Location:  Lumbar spine Quality:  Aching Radiates to:  Does not radiate Pain severity:  Moderate Onset quality:  Gradual Duration:  6 weeks Timing:  Constant Progression:  Worsening Chronicity:  New Context comment:  Dc today from admit for bil degenerative disc disease Relieved by:  Nothing Worsened by:  Palpation, touching, twisting, sitting, lying down and bending Ineffective treatments:  None tried Associated symptoms: bladder incontinence (now resolved), numbness and weakness (bil le)   Associated symptoms: no abdominal pain, no bowel incontinence, no chest pain and no fever     Past Medical History  Diagnosis Date  . Hypertension   . DDD (degenerative disc disease), cervical   . DJD (degenerative joint disease)   . Obesity   . Chronic back pain    Past Surgical History  Procedure Laterality Date  . Tonsillectomy     Family History  Problem Relation Age of Onset  . Hypertension Mother   . Diabetes Mother   . Hypertension Father   . Stroke Father    History  Substance Use Topics  . Smoking status: Never Smoker   . Smokeless tobacco: Not on file  . Alcohol Use: No   OB History   Grav Para Term Preterm Abortions TAB SAB Ect Mult Living                 Review of Systems  Constitutional: Negative for fever.  Cardiovascular: Negative for chest pain.  Gastrointestinal: Negative for abdominal pain and bowel incontinence.  Genitourinary: Positive for bladder incontinence (now resolved).  Musculoskeletal: Positive for back pain.  Neurological: Positive for weakness (bil le) and numbness.  All other systems reviewed and are  negative.     Allergies  Tramadol  Home Medications   Prior to Admission medications   Medication Sig Start Date End Date Taking? Authorizing Provider  albuterol (PROVENTIL HFA;VENTOLIN HFA) 108 (90 BASE) MCG/ACT inhaler Inhale 2 puffs into the lungs every 6 (six) hours as needed for wheezing or shortness of breath.   Yes Historical Provider, MD  diazepam (VALIUM) 5 MG tablet Take 5 mg by mouth 3 (three) times daily as needed for muscle spasms (pain).   Yes Historical Provider, MD  ibuprofen (ADVIL,MOTRIN) 200 MG tablet Take 600 mg by mouth every 6 (six) hours as needed for mild pain.    Yes Historical Provider, MD  cyclobenzaprine (FLEXERIL) 5 MG tablet Take 1 tablet (5 mg total) by mouth 3 (three) times daily as needed for muscle spasms. 12/12/13   Dimas Chyle, MD  diclofenac (VOLTAREN) 75 MG EC tablet Take 1 tablet (75 mg total) by mouth 2 (two) times daily. 12/09/13   Lenox Ahr, PA-C  HYDROcodone-acetaminophen (NORCO/VICODIN) 5-325 MG per tablet Take 1 tablet by mouth every 4 (four) hours as needed. 12/09/13   Lenox Ahr, PA-C  losartan (COZAAR) 25 MG tablet Take 0.5 tablets (12.5 mg total) by mouth daily. 12/12/13   Dimas Chyle, MD   BP 162/88  Pulse 79  Temp(Src) 98.3 F (36.8 C) (Oral)  Resp 19  Ht 5\' 11"  (1.803 m)  Wt 315 lb (142.883 kg)  BMI 43.95 kg/m2  SpO2 99%  LMP 11/28/2013 Physical Exam  Vitals reviewed. Constitutional: She is oriented to person, place, and time. She appears well-developed and well-nourished.  HENT:  Head: Normocephalic and atraumatic.  Right Ear: External ear normal.  Left Ear: External ear normal.  Eyes: Conjunctivae and EOM are normal. Pupils are equal, round, and reactive to light.  Neck: Normal range of motion. Neck supple.  Cardiovascular: Normal rate, regular rhythm, normal heart sounds and intact distal pulses.   Pulmonary/Chest: Effort normal and breath sounds normal.  Abdominal: Soft. Bowel sounds are normal. There is no  tenderness.  Musculoskeletal: Normal range of motion.       Cervical back: She exhibits tenderness and bony tenderness.       Thoracic back: She exhibits tenderness and bony tenderness.       Lumbar back: She exhibits tenderness and bony tenderness.  Neurological: She is alert and oriented to person, place, and time. A sensory deficit (subj decrease in bil le) is present. She exhibits abnormal muscle tone. Gait abnormal. GCS eye subscore is 4. GCS verbal subscore is 5. GCS motor subscore is 6.  Reflex Scores:      Patellar reflexes are 2+ on the right side and 2+ on the left side.      Achilles reflexes are 2+ on the right side and 2+ on the left side. 2/5 strength in bil hip flexion, 3/5 in ankle flexion/extension, no asymmetric deficits.  No neuro deficits above hips  Skin: Skin is warm and dry.    ED Course  Procedures (including critical care time) Labs Review Labs Reviewed - No data to display  Imaging Review No results found.   EKG Interpretation None      MDM   Final diagnoses:  Intractable low back pain  Weakness of both lower extremities    48 y.o. female with pertinent PMH of DDD with recent MRI without central cord pathology of L spine but with extensive foraminal narrowing bilaterally presents with continued symptoms and inability to walk and care for self at home.  No new symptoms of cauda equina present from dc, pt was unable to walk and use legs, which she states was also present during hospitalization.  No indication for new imaging without new trauma or symptoms, and doubt intracranial pathology with bil LE symptoms without upper or other symptoms.  Consulted family medicine for admission.    1. Intractable low back pain   2. Weakness of both lower extremities         Debby Freiberg, MD 12/12/13 [phone]

## 2013-12-12 NOTE — ED Provider Notes (Signed)
Medical screening examination/treatment/procedure(s) were performed by non-physician practitioner and as supervising physician I was immediately available for consultation/collaboration.   EKG Interpretation None       Nat Christen, MD 12/12/13 [phone]

## 2013-12-12 NOTE — Discharge Instructions (Signed)
You were admitted to the hospital because of weakness and numbness and tingling. While here you worked with the neurologist, who did not think any further work up was needed. You also worked with the physical therapist and occupational therapist who thought it was safe for you to be discharged home. We also started you on a blood pressure medication here, called losartan. You should continue this medication until you can follow up at the [facility].   Weakness Weakness is a lack of strength. You may feel weak all over your body or just in one part of your body.  HOME CARE  Eat a well-balanced diet.  Try to exercise every day.  Only take medicines as told by your doctor. GET HELP RIGHT AWAY IF:   You cannot do your normal daily activities.  You cannot walk up and down stairs, or you feel very tired when you do so.  You have shortness of breath or chest pain.  You have trouble moving parts of your body.  You have weakness in only one body part or on only one side of the body.  You have a fever.  You have trouble speaking or swallowing.  You cannot control when you pee (urinate) or poop (bowel movement).  You have black or bloody throw up (vomit) or poop.  Your weakness gets worse or spreads to other body parts.  You have new aches or pains. MAKE SURE YOU:   Understand these instructions.  Will watch your condition.  Will get help right away if you are not doing well or get worse. Document Released: 02/21/2008 Document Revised: 09/09/2011 Document Reviewed: 05/09/2011 [facility] Patient Information 2015 [place], [place]. This information is not intended to replace advice given to you by your health care provider. Make sure you discuss any questions you have with your health care provider.

## 2013-12-12 NOTE — ED Notes (Signed)
Pt states "they discharged me earlier today and I knew I was not ready to go home and I went home and my back still hurt. When I left earlier they told me I had to go, the lady who picked me up told me I was not ready to go home. I walked today but when I got home my legs was like noodles. My mama and my daddy had to held me up, my britches was in my crack. Like my legs felt like they was poppin." Denies LOC and denies hitting head with fall.

## 2013-12-13 ENCOUNTER — Observation Stay (HOSPITAL_COMMUNITY): Payer: Medicaid Other

## 2013-12-13 DIAGNOSIS — R29898 Other symptoms and signs involving the musculoskeletal system: Secondary | ICD-10-CM | POA: Diagnosis present

## 2013-12-13 LAB — BASIC METABOLIC PANEL
Anion gap: 13 (ref 5–15)
BUN: 14 mg/dL (ref 6–23)
CO2: 29 mEq/L (ref 19–32)
CREATININE: 0.8 mg/dL (ref 0.50–1.10)
Calcium: 8.8 mg/dL (ref 8.4–10.5)
Chloride: 96 mEq/L (ref 96–112)
GFR calc non Af Amer: 86 mL/min — ABNORMAL LOW (ref >90)
Glucose, Bld: 121 mg/dL — ABNORMAL HIGH (ref 70–99)
POTASSIUM: 3.7 meq/L (ref 3.7–5.3)
Sodium: 138 mEq/L (ref 137–147)

## 2013-12-13 LAB — CBC
HEMATOCRIT: 34.8 % — AB (ref 36.0–46.0)
Hemoglobin: 11.6 g/dL — ABNORMAL LOW (ref 12.0–15.0)
MCH: 28.9 pg (ref 26.0–34.0)
MCHC: 33.3 g/dL (ref 30.0–36.0)
MCV: 86.8 fL (ref 78.0–100.0)
Platelets: 303 10*3/uL (ref 150–400)
RBC: 4.01 MIL/uL (ref 3.87–5.11)
RDW: 14.3 % (ref 11.5–15.5)
WBC: 7.8 10*3/uL (ref 4.0–10.5)

## 2013-12-13 LAB — SEDIMENTATION RATE: SED RATE: 59 mm/h — AB (ref 0–22)

## 2013-12-13 LAB — C-REACTIVE PROTEIN: CRP: 11.6 mg/dL — ABNORMAL HIGH (ref <0.60)

## 2013-12-13 LAB — HEMOGLOBIN A1C
HEMOGLOBIN A1C: 6.7 % — AB (ref <5.7)
Mean Plasma Glucose: 146 mg/dL — ABNORMAL HIGH (ref <117)

## 2013-12-13 LAB — TSH: TSH: 2.13 u[IU]/mL (ref 0.350–4.500)

## 2013-12-13 LAB — VITAMIN B12: Vitamin B-12: 209 pg/mL — ABNORMAL LOW (ref 211–911)

## 2013-12-13 LAB — MAGNESIUM: Magnesium: 2.2 mg/dL (ref 1.5–2.5)

## 2013-12-13 LAB — CK: Total CK: 56 U/L (ref 7–177)

## 2013-12-13 LAB — PHOSPHORUS: PHOSPHORUS: 3.9 mg/dL (ref 2.3–4.6)

## 2013-12-13 MED ORDER — LORAZEPAM 1 MG PO TABS
1.0000 mg | ORAL_TABLET | Freq: Once | ORAL | Status: AC
  Administered 2013-12-14: 1 mg via ORAL
  Filled 2013-12-13: qty 1

## 2013-12-13 MED ORDER — ACETAMINOPHEN 325 MG PO TABS
650.0000 mg | ORAL_TABLET | Freq: Four times a day (QID) | ORAL | Status: DC | PRN
  Administered 2013-12-13 – 2013-12-14 (×4): 650 mg via ORAL
  Filled 2013-12-13 (×4): qty 2

## 2013-12-13 MED ORDER — CYCLOBENZAPRINE HCL 10 MG PO TABS: 5.0000 mg | ORAL_TABLET | Freq: Three times a day (TID) | ORAL | Status: DC | PRN

## 2013-12-13 MED ORDER — LORAZEPAM 2 MG/ML IJ SOLN: 1.0000 mg | Freq: Once | INTRAMUSCULAR | Status: AC

## 2013-12-13 MED ORDER — CYCLOBENZAPRINE HCL 10 MG PO TABS
5.0000 mg | ORAL_TABLET | Freq: Once | ORAL | Status: AC
  Administered 2013-12-13: 5 mg via ORAL
  Filled 2013-12-13: qty 1

## 2013-12-13 MED ORDER — LOSARTAN POTASSIUM 25 MG PO TABS
12.5000 mg | ORAL_TABLET | Freq: Every day | ORAL | Status: DC
  Administered 2013-12-13 – 2014-01-09 (×25): 12.5 mg via ORAL
  Filled 2013-12-13 (×30): qty 0.5

## 2013-12-13 MED ORDER — DIAZEPAM 5 MG PO TABS
5.0000 mg | ORAL_TABLET | Freq: Three times a day (TID) | ORAL | Status: DC | PRN
  Administered 2013-12-13 (×2): 5 mg via ORAL
  Filled 2013-12-13 (×2): qty 1

## 2013-12-13 MED ORDER — ALBUTEROL SULFATE (2.5 MG/3ML) 0.083% IN NEBU: 2.5000 mg | INHALATION_SOLUTION | Freq: Four times a day (QID) | RESPIRATORY_TRACT | Status: DC | PRN

## 2013-12-13 MED ORDER — POLYETHYLENE GLYCOL 3350 17 G PO PACK
17.0000 g | PACK | Freq: Every day | ORAL | Status: DC
  Administered 2013-12-13 – 2014-01-08 (×24): 17 g via ORAL
  Filled 2013-12-13 (×26): qty 1

## 2013-12-13 NOTE — Care Management Note (Signed)
12/13/2013 10:34am Ricki Miller, RN BSN Case Manager CM called [facility] to make appointment. Patient scheduled for Monday, Sept. 28,2015 at 2:00pm with Dr. Doreene Burke. CM called patient to provide this information and patient stated that she has been readmitted to the hospital. Case manager notified unit case manager of this appointment

## 2013-12-13 NOTE — Evaluation (Signed)
Physical Therapy Evaluation Patient Details Name: Sarah [name] MRN: [id number] DOB: 05-08-1965 Today's Date: 12/13/2013   History of Present Illness  48 y.o. admitted with BLE weakness 12-13-13 after being d/c'd that AM from her 12-11-13 admission for intractable back pain.  CT is unremarkable showing only DJD.  Clinical Impression  Pt admitted with above. Pt currently with functional limitations due to the deficits listed below (see PT Problem List). Pt declining OOB at time of eval, stating she is just too tired from having to come back to the hospital last night. Pt presents with 2/5 strength BLE.  She is total assist with transfers and will need maxi move to transfer bed to recliner. Pt will benefit from skilled PT to increase their independence and safety with mobility to allow discharge to the venue listed below.        Follow Up Recommendations SNF;Supervision/Assistance - 24 hour    Equipment Recommendations  Rolling walker with 5" wheels;Wheelchair (measurements PT)    Recommendations for Other Services       Precautions / Restrictions Precautions Precautions: Fall      Mobility  Bed Mobility Overal bed mobility: Needs Assistance Bed Mobility: Rolling;Supine to Sit Rolling: Mod assist   Supine to sit: Total assist     General bed mobility comments: use of bedrails for rolling  Transfers Overall transfer level: Needs assistance                  Ambulation/Gait                Stairs            Wheelchair Mobility    Modified Rankin (Stroke Patients Only)       Balance                                             Pertinent Vitals/Pain Pain Location: My back hurts when I sit up.    Home Living Family/patient expects to be discharged to:: Private residence Living Arrangements: Other relatives Available Help at Discharge: Family;Available 24 hours/day Type of Home: House Home Access: Stairs to enter Entrance  Stairs-Rails: None Entrance Stairs-Number of Steps: 2 (deep enough for all 4 legs of RW) Home Layout: One level Home Equipment: None      Prior Function Level of Independence: Independent         Comments: Pt works at [employer].      Hand Dominance   Dominant Hand: Right    Extremity/Trunk Assessment   Upper Extremity Assessment: Overall WFL for tasks assessed           Lower Extremity Assessment: RLE deficits/detail;LLE deficits/detail RLE Deficits / Details: 2/5 grossly graded LLE Deficits / Details: 2/5 grossly graded     Communication   Communication: No difficulties  Cognition Arousal/Alertness: Awake/alert Behavior During Therapy: WFL for tasks assessed/performed Overall Cognitive Status: Within Functional Limits for tasks assessed                      General Comments      Exercises        Assessment/Plan    PT Assessment Patient needs continued PT services  PT Diagnosis Difficulty walking;Generalized weakness;Acute pain   PT Problem List Decreased strength;Decreased activity tolerance;Decreased balance;Decreased mobility;Decreased coordination;Pain;Decreased knowledge of use of DME  PT Treatment Interventions DME instruction;Gait  training;Stair training;Functional mobility training;Therapeutic activities;Therapeutic exercise;Patient/family education;Balance training   PT Goals (Current goals can be found in the Care Plan section) Acute Rehab PT Goals Patient Stated Goal: not stated PT Goal Formulation: With patient Time For Goal Achievement: 12/27/13 Potential to Achieve Goals: Good    Frequency Min 3X/week   Barriers to discharge        Co-evaluation               End of Session   Activity Tolerance: Patient limited by fatigue Patient left: in bed;with call bell/phone within reach;with family/visitor present Nurse Communication: Need for lift equipment;Mobility status    Functional Assessment Tool Used:  clinical judgement Functional Limitation: Mobility: Walking and moving around Mobility: Walking and Moving Around Current Status [phone]): 100 percent impaired, limited or restricted Mobility: Walking and Moving Around Goal Status [phone]): At least 20 percent but less than 40 percent impaired, limited or restricted    Time: 1010-1027 PT Time Calculation (min): 17 min   Charges:   PT Evaluation $Initial PT Evaluation Tier I: 1 Procedure PT Treatments $Therapeutic Activity: 8-22 mins   PT G Codes:   Functional Assessment Tool Used: clinical judgement Functional Limitation: Mobility: Walking and moving around    Lorriane Shire 12/13/2013, 12:26 PM

## 2013-12-13 NOTE — Progress Notes (Signed)
Received pt report from [facility].

## 2013-12-13 NOTE — Care Management Note (Signed)
    Page 1 of 1   12/14/2013     2:42:27 PM CARE MANAGEMENT NOTE 12/14/2013  Patient:  Sarah [name], Sarah [name]   Account Number:  [id number]  Date Initiated:  12/13/2013  Documentation initiated by:  Tomi Bamberger  Subjective/Objective Assessment:   dx weakness  admit- lives with sister. pta patient was ambulatory,now she can not walk.     Action/Plan:   MRI pending.  If comes back normal, will get psych consult.  pt eval- rec snf.   Anticipated DC Date:  12/14/2013   Anticipated DC Plan:  SKILLED NURSING FACILITY  In-house referral  Clinical Social Worker      DC Planning Services  CM consult      Choice offered to / List presented to:             Status of service:  In process, will continue to follow Medicare Important Message given?  NO (If response is "NO", the following Medicare IM given date fields will be blank) Date Medicare IM given:   Medicare IM given by:   Date Additional Medicare IM given:   Additional Medicare IM given by:    Discharge Disposition:    Per UR Regulation:  Reviewed for med. 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  12/13/13 [place], BSN [phone] patient lives with sister, patient is not ambulatory at this time, which she previously, she was just admitted on 5N as observation and dc on 9/21.  MD states if MRI shows something they will get Neuro consult, if not psych consult.  NCM informed CSW about this case.  Would need to do LOG for snf placement.

## 2013-12-13 NOTE — Discharge Summary (Signed)
FMTS attending note Patient seen and examined by me on day of discharge; discussed with residents and agree with plan.  Patient ambulated successfully with walker under PT supervision. Appreciate Neurology consult on patient as well during this admission. Agree for plan for discharge to home with HHPT.  Dalbert Mayotte, MD

## 2013-12-13 NOTE — Progress Notes (Signed)
Pt alert oriented x4. Able to make needs known. In no acute distress, no SOB noted. Vital signs taken and stable. Skin warm and dry, no skin issues noted. LAC IV site, clean, dry and intact, no redness. Pt care guide provided. Oriented to room and staff. Call light within reached. Bed at its lowest position. We will continue to monitor.

## 2013-12-13 NOTE — H&P (Signed)
Call Pager [phone] for any questions or notifications regarding this patient  FMTS Attending Admission Note: Sarah Mcmurray MD Attending pager:[phone] [phone] I  have seen and examined this patient, reviewed their chart. I have discussed this patient with the resident. I agree with the resident's findings, assessment and care plan. I reviewed chart from recent hospitalization (d/c yesterday with re-admission in evening of day of d/c). The work up seemed complete and included appropriate neurology consult. Unclear what additional wok up or treatment we can do but will discuss with neurology. The inpatient team had considered short term rehab but patient is without insurance so that was not an option. Her sister was at bedside today and we spoke as well.Ssister reports she used to work for a [employer]/textile [employer] in town and her main concern is for MS.

## 2013-12-13 NOTE — Progress Notes (Signed)
Agree with PTA.    Aubrii Sharpless, PT [phone]  

## 2013-12-14 ENCOUNTER — Encounter (HOSPITAL_COMMUNITY): Payer: Medicaid Other | Admitting: Certified Registered"

## 2013-12-14 ENCOUNTER — Encounter (HOSPITAL_COMMUNITY)
Admission: EM | Disposition: A | Discharge status: Rehabilitation Facility | Payer: Self-pay | Source: Home / Self Care | Attending: Family Medicine

## 2013-12-14 ENCOUNTER — Inpatient Hospital Stay (HOSPITAL_COMMUNITY): Payer: Medicaid Other

## 2013-12-14 ENCOUNTER — Inpatient Hospital Stay (HOSPITAL_COMMUNITY): Payer: Medicaid Other | Admitting: Certified Registered"

## 2013-12-14 ENCOUNTER — Observation Stay (HOSPITAL_COMMUNITY): Payer: Medicaid Other

## 2013-12-14 ENCOUNTER — Encounter (HOSPITAL_COMMUNITY): Payer: Self-pay | Admitting: *Deleted

## 2013-12-14 DIAGNOSIS — C7951 Secondary malignant neoplasm of bone: Secondary | ICD-10-CM | POA: Diagnosis present

## 2013-12-14 DIAGNOSIS — C3490 Malignant neoplasm of unspecified part of unspecified bronchus or lung: Secondary | ICD-10-CM | POA: Diagnosis present

## 2013-12-14 DIAGNOSIS — G9529 Other cord compression: Secondary | ICD-10-CM | POA: Diagnosis present

## 2013-12-14 DIAGNOSIS — M8458XA Pathological fracture in neoplastic disease, other specified site, initial encounter for fracture: Secondary | ICD-10-CM | POA: Diagnosis present

## 2013-12-14 DIAGNOSIS — E041 Nontoxic single thyroid nodule: Secondary | ICD-10-CM | POA: Diagnosis present

## 2013-12-14 DIAGNOSIS — Z8249 Family history of ischemic heart disease and other diseases of the circulatory system: Secondary | ICD-10-CM | POA: Diagnosis not present

## 2013-12-14 DIAGNOSIS — E282 Polycystic ovarian syndrome: Secondary | ICD-10-CM | POA: Diagnosis present

## 2013-12-14 DIAGNOSIS — C73 Malignant neoplasm of thyroid gland: Secondary | ICD-10-CM | POA: Diagnosis present

## 2013-12-14 DIAGNOSIS — Z7401 Bed confinement status: Secondary | ICD-10-CM | POA: Diagnosis not present

## 2013-12-14 DIAGNOSIS — I10 Essential (primary) hypertension: Secondary | ICD-10-CM | POA: Diagnosis present

## 2013-12-14 DIAGNOSIS — D6869 Other thrombophilia: Secondary | ICD-10-CM | POA: Diagnosis present

## 2013-12-14 DIAGNOSIS — I2699 Other pulmonary embolism without acute cor pulmonale: Secondary | ICD-10-CM | POA: Diagnosis present

## 2013-12-14 DIAGNOSIS — E1165 Type 2 diabetes mellitus with hyperglycemia: Secondary | ICD-10-CM | POA: Diagnosis present

## 2013-12-14 DIAGNOSIS — L680 Hirsutism: Secondary | ICD-10-CM | POA: Diagnosis present

## 2013-12-14 DIAGNOSIS — Z6841 Body Mass Index (BMI) 40.0 and over, adult: Secondary | ICD-10-CM | POA: Diagnosis not present

## 2013-12-14 DIAGNOSIS — G8220 Paraplegia, unspecified: Secondary | ICD-10-CM | POA: Diagnosis not present

## 2013-12-14 DIAGNOSIS — R339 Retention of urine, unspecified: Secondary | ICD-10-CM | POA: Diagnosis not present

## 2013-12-14 DIAGNOSIS — J95821 Acute postprocedural respiratory failure: Secondary | ICD-10-CM | POA: Diagnosis not present

## 2013-12-14 HISTORY — PX: LAMINECTOMY: SHX219

## 2013-12-14 LAB — CORTISOL: CORTISOL PLASMA: 25.4 ug/dL

## 2013-12-14 LAB — SURGICAL PCR SCREEN
MRSA, PCR: NEGATIVE
Staphylococcus aureus: NEGATIVE

## 2013-12-14 LAB — RPR

## 2013-12-14 LAB — HIV ANTIBODY (ROUTINE TESTING W REFLEX): HIV 1&2 Ab, 4th Generation: NONREACTIVE

## 2013-12-14 SURGERY — THORACIC LAMINECTOMY FOR TUMOR
Anesthesia: General

## 2013-12-14 MED ORDER — IOHEXOL 300 MG/ML  SOLN
100.0000 mL | Freq: Once | INTRAMUSCULAR | Status: AC | PRN
  Administered 2013-12-14: 100 mL via INTRAVENOUS

## 2013-12-14 MED ORDER — THROMBIN 20000 UNITS EX SOLR
CUTANEOUS | Status: DC | PRN
  Administered 2013-12-14: 23:00:00 via TOPICAL

## 2013-12-14 MED ORDER — LIDOCAINE-EPINEPHRINE 0.5 %-1:200000 IJ SOLN
INTRAMUSCULAR | Status: DC | PRN
  Administered 2013-12-14: 9 mL via INTRADERMAL

## 2013-12-14 MED ORDER — IOHEXOL 300 MG/ML  SOLN
25.0000 mL | INTRAMUSCULAR | Status: AC
  Administered 2013-12-14 (×2): 25 mL via ORAL

## 2013-12-14 MED ORDER — SODIUM CHLORIDE 0.9 % IV SOLN
INTRAVENOUS | Status: DC | PRN
  Administered 2013-12-14: 23:00:00 via INTRAVENOUS

## 2013-12-14 MED ORDER — FENTANYL CITRATE 0.05 MG/ML IJ SOLN
INTRAMUSCULAR | Status: AC
  Filled 2013-12-14: qty 5

## 2013-12-14 MED ORDER — DEXAMETHASONE SODIUM PHOSPHATE 4 MG/ML IJ SOLN
INTRAMUSCULAR | Status: DC | PRN
  Administered 2013-12-14: 8 mg via INTRAVENOUS

## 2013-12-14 MED ORDER — LORAZEPAM 1 MG PO TABS
1.0000 mg | ORAL_TABLET | Freq: Once | ORAL | Status: AC
  Administered 2013-12-14: 1 mg via ORAL
  Filled 2013-12-14: qty 1

## 2013-12-14 MED ORDER — MIDAZOLAM HCL 5 MG/5ML IJ SOLN
INTRAMUSCULAR | Status: DC | PRN
  Administered 2013-12-14: 2 mg via INTRAVENOUS

## 2013-12-14 MED ORDER — DEXAMETHASONE SODIUM PHOSPHATE 4 MG/ML IJ SOLN
INTRAMUSCULAR | Status: AC
  Filled 2013-12-14: qty 3

## 2013-12-14 MED ORDER — CEFAZOLIN SODIUM-DEXTROSE 2-3 GM-% IV SOLR
INTRAVENOUS | Status: DC | PRN
  Administered 2013-12-14: 2 g via INTRAVENOUS

## 2013-12-14 MED ORDER — PROPOFOL 10 MG/ML IV BOLUS
INTRAVENOUS | Status: AC
  Filled 2013-12-14: qty 20

## 2013-12-14 MED ORDER — LACTATED RINGERS IV SOLN
INTRAVENOUS | Status: DC | PRN
  Administered 2013-12-14: 22:00:00 via INTRAVENOUS

## 2013-12-14 MED ORDER — FENTANYL CITRATE 0.05 MG/ML IJ SOLN
INTRAMUSCULAR | Status: DC | PRN
  Administered 2013-12-14: 50 ug via INTRAVENOUS
  Administered 2013-12-14 (×2): 100 ug via INTRAVENOUS
  Administered 2013-12-14 (×3): 50 ug via INTRAVENOUS
  Administered 2013-12-15: 100 ug via INTRAVENOUS

## 2013-12-14 MED ORDER — OXYCODONE-ACETAMINOPHEN 5-325 MG PO TABS
1.0000 | ORAL_TABLET | ORAL | Status: DC | PRN
  Filled 2013-12-14: qty 1

## 2013-12-14 MED ORDER — MIDAZOLAM HCL 2 MG/2ML IJ SOLN
INTRAMUSCULAR | Status: AC
  Filled 2013-12-14: qty 2

## 2013-12-14 MED ORDER — PROPOFOL 10 MG/ML IV BOLUS
INTRAVENOUS | Status: DC | PRN
  Administered 2013-12-14: 180 mg via INTRAVENOUS

## 2013-12-14 MED ORDER — LIDOCAINE HCL (CARDIAC) 20 MG/ML IV SOLN
INTRAVENOUS | Status: DC | PRN
  Administered 2013-12-14: 60 mg via INTRAVENOUS

## 2013-12-14 MED ORDER — HEPARIN SODIUM (PORCINE) 5000 UNIT/ML IJ SOLN
5000.0000 [IU] | Freq: Three times a day (TID) | INTRAMUSCULAR | Status: DC
  Administered 2013-12-15 – 2013-12-16 (×5): 5000 [IU] via SUBCUTANEOUS
  Filled 2013-12-14 (×6): qty 1

## 2013-12-14 MED ORDER — DEXAMETHASONE 4 MG PO TABS
6.0000 mg | ORAL_TABLET | Freq: Four times a day (QID) | ORAL | Status: DC
  Administered 2013-12-15 – 2013-12-21 (×25): 6 mg via ORAL
  Filled 2013-12-14 (×48): qty 1

## 2013-12-14 MED ORDER — 0.9 % SODIUM CHLORIDE (POUR BTL) OPTIME
TOPICAL | Status: DC | PRN
  Administered 2013-12-14: 1000 mL

## 2013-12-14 MED ORDER — SUCCINYLCHOLINE CHLORIDE 20 MG/ML IJ SOLN
INTRAMUSCULAR | Status: DC | PRN
  Administered 2013-12-14: 140 mg via INTRAVENOUS

## 2013-12-14 MED ORDER — GADOBENATE DIMEGLUMINE 529 MG/ML IV SOLN: 20.0000 mL | Freq: Once | INTRAVENOUS | Status: AC | PRN

## 2013-12-14 SURGICAL SUPPLY — 62 items
BAG DECANTER FOR FLEXI CONT (MISCELLANEOUS) ×3 IMPLANT
BLADE SURG 11 STRL SS (BLADE) IMPLANT
BLADE SURG ROTATE 9660 (MISCELLANEOUS) IMPLANT
BUR MATCHSTICK NEURO 3.0 LAGG (BURR) ×3 IMPLANT
CANISTER SUCT 3000ML (MISCELLANEOUS) ×3 IMPLANT
CONT SPEC 4OZ CLIKSEAL STRL BL (MISCELLANEOUS) ×6 IMPLANT
DERMABOND ADVANCED (GAUZE/BANDAGES/DRESSINGS) ×2
DERMABOND ADVANCED .7 DNX12 (GAUZE/BANDAGES/DRESSINGS) ×1 IMPLANT
DRAPE LAPAROTOMY 100X72X124 (DRAPES) IMPLANT
DRAPE LAPAROTOMY T 102X78X121 (DRAPES) IMPLANT
DRAPE MICROSCOPE LEICA (MISCELLANEOUS) ×3 IMPLANT
DRAPE PED LAPAROTOMY (DRAPES) ×3 IMPLANT
DRAPE POUCH INSTRU U-SHP 10X18 (DRAPES) ×3 IMPLANT
DRAPE SURG 17X23 STRL (DRAPES) IMPLANT
ELECT REM PT RETURN 9FT ADLT (ELECTROSURGICAL) ×3
ELECTRODE REM PT RTRN 9FT ADLT (ELECTROSURGICAL) ×1 IMPLANT
GAUZE SPONGE 4X4 12PLY STRL (GAUZE/BANDAGES/DRESSINGS) ×3 IMPLANT
GAUZE SPONGE 4X4 16PLY XRAY LF (GAUZE/BANDAGES/DRESSINGS) IMPLANT
GLOVE BIO SURGEON STRL SZ 6.5 (GLOVE) ×4 IMPLANT
GLOVE BIO SURGEONS STRL SZ 6.5 (GLOVE) ×2
GLOVE BIOGEL PI IND STRL 7.0 (GLOVE) ×1 IMPLANT
GLOVE BIOGEL PI INDICATOR 7.0 (GLOVE) ×2
GLOVE ECLIPSE 6.5 STRL STRAW (GLOVE) ×3 IMPLANT
GLOVE EXAM NITRILE LRG STRL (GLOVE) IMPLANT
GLOVE EXAM NITRILE MD LF STRL (GLOVE) ×3 IMPLANT
GLOVE EXAM NITRILE XL STR (GLOVE) IMPLANT
GLOVE EXAM NITRILE XS STR PU (GLOVE) IMPLANT
GOWN STRL REUS W/ TWL LRG LVL3 (GOWN DISPOSABLE) ×2 IMPLANT
GOWN STRL REUS W/ TWL XL LVL3 (GOWN DISPOSABLE) IMPLANT
GOWN STRL REUS W/TWL 2XL LVL3 (GOWN DISPOSABLE) IMPLANT
GOWN STRL REUS W/TWL LRG LVL3 (GOWN DISPOSABLE) ×4
GOWN STRL REUS W/TWL XL LVL3 (GOWN DISPOSABLE)
HEMOSTAT SURGICEL 2X14 (HEMOSTASIS) IMPLANT
KIT BASIN OR (CUSTOM PROCEDURE TRAY) ×3 IMPLANT
KIT ROOM TURNOVER OR (KITS) ×3 IMPLANT
NEEDLE HYPO 22GX1.5 SAFETY (NEEDLE) ×3 IMPLANT
NEEDLE SPNL 20GX3.5 QUINCKE YW (NEEDLE) IMPLANT
NS IRRIG 1000ML POUR BTL (IV SOLUTION) ×3 IMPLANT
PACK LAMINECTOMY NEURO (CUSTOM PROCEDURE TRAY) ×3 IMPLANT
PAD EYE OVAL STERILE LF (GAUZE/BANDAGES/DRESSINGS) IMPLANT
PATTIES SURGICAL .5 X.5 (GAUZE/BANDAGES/DRESSINGS) IMPLANT
PATTIES SURGICAL .5 X3 (DISPOSABLE) IMPLANT
RUBBERBAND STERILE (MISCELLANEOUS) ×6 IMPLANT
SPONGE LAP 4X18 X RAY DECT (DISPOSABLE) IMPLANT
SPONGE SURGIFOAM ABS GEL 100 (HEMOSTASIS) ×3 IMPLANT
STAPLER VISISTAT 35W (STAPLE) ×3 IMPLANT
SUT BONE WAX W31G (SUTURE) IMPLANT
SUT ETHILON 4 0 PS 2 18 (SUTURE) ×3 IMPLANT
SUT NURALON 4 0 TR CR/8 (SUTURE) IMPLANT
SUT PROLENE 6 0 BV (SUTURE) IMPLANT
SUT VIC AB 0 CT1 18XCR BRD8 (SUTURE) ×1 IMPLANT
SUT VIC AB 0 CT1 8-18 (SUTURE) ×2
SUT VIC AB 2-0 CT1 18 (SUTURE) ×3 IMPLANT
SUT VIC AB 3-0 SH 8-18 (SUTURE) ×3 IMPLANT
SUT VICRYL 4-0 PS2 18IN ABS (SUTURE) IMPLANT
SYR 20ML ECCENTRIC (SYRINGE) IMPLANT
TOWEL OR 17X24 6PK STRL BLUE (TOWEL DISPOSABLE) ×3 IMPLANT
TOWEL OR 17X26 10 PK STRL BLUE (TOWEL DISPOSABLE) ×3 IMPLANT
TRAY FOLEY CATH 14FRSI W/METER (CATHETERS) IMPLANT
TUBE CONNECTING 12'X1/4 (SUCTIONS)
TUBE CONNECTING 12X1/4 (SUCTIONS) IMPLANT
WATER STERILE IRR 1000ML POUR (IV SOLUTION) ×3 IMPLANT

## 2013-12-14 NOTE — Consult Note (Signed)
Reason for Consult: lower extremity paraplegia, thoracic T3 mass Referring Physician: Chanese, Sarah [name] is an 48 y.o. female.  HPI: whom was admitted secondary to difficulty walking stating that she could not stand on 9/19. Resident's note from 9/19 stated"Went to [facility] 9/18 CT lumbar spine ordered with arthropathy and spondy but no acute. Was given vicodin, valium, toradol d/c home with some improvement in sx but then nauseated. Was concerned bc could not bear weight at home. Walked 1 step and then fell down thus came back to the ED via EMS". She also complained of pain in the midthoracic to upper thoracic region. She was seen by neurology during her admission and there was no obvious etiology to support her physical exam per the consult. She was discharged after ambulating 60 feet with physical therapy on 9/21. After ~ 1 hour car ride to her home, she was unable to stand, or walk. She was then brought by ambulance back to [facility] for admission. A cervical spine MRI today revealed a lesion compressing the spinal cord. Further evaluation with CT, and Thoracic MRI revealed a lytic mass at T3, a markedly enlarged thyroid/mass in the neck, severe cord compression at T3. She has been unable to void, nor pass her stool since readmission.  She reports that she has taken synthroid in the past.  Past Medical History  Diagnosis Date  . Hypertension   . DDD (degenerative disc disease), cervical   . DJD (degenerative joint disease)   . Obesity   . Chronic back pain     Past Surgical History  Procedure Laterality Date  . Tonsillectomy      Family History  Problem Relation Age of Onset  . Hypertension Mother   . Diabetes Mother   . Hypertension Father   . Stroke Father     Social History:  reports that she has never smoked. She does not have any smokeless tobacco history on file. She reports that she does not drink alcohol or use illicit drugs.  Allergies:  Allergies  Allergen Reactions  .  Tramadol Nausea And Vomiting    Medications: I have reviewed the patient's current medications.  Results for orders placed during the hospital encounter of 12/12/13 (from the past 48 hour(s))  TSH     Status: None   Collection Time    12/13/13  7:49 AM      Result Value Ref Range   TSH 2.130  0.350 - 4.500 uIU/mL  MAGNESIUM     Status: None   Collection Time    12/13/13  7:49 AM      Result Value Ref Range   Magnesium 2.2  1.5 - 2.5 mg/dL  HEMOGLOBIN A1C     Status: Abnormal   Collection Time    12/13/13  7:49 AM      Result Value Ref Range   Hemoglobin A1C 6.7 (*) <5.7 %   Comment: (NOTE)                                                                               According to the ADA Clinical Practice Recommendations for 2011, when     HbA1c is used as a  screening test:      >=6.5%   Diagnostic of Diabetes Mellitus               (if abnormal result is confirmed)     5.7-6.4%   Increased risk of developing Diabetes Mellitus     References:Diagnosis and Classification of Diabetes Mellitus,Diabetes     OACZ,6606,30(ZSWFU 1):S62-S69 and Standards of Medical Care in             Diabetes - 2011,Diabetes Care,2011,34 (Suppl 1):S11-S61.   Mean Plasma Glucose 146 (*) <117 mg/dL   Comment: Performed at [facility]     Status: Abnormal   Collection Time    12/13/13  7:49 AM      Result Value Ref Range   Vitamin B-12 209 (*) 211 - 911 pg/mL   Comment: Performed at [facility]     Status: None   Collection Time    12/13/13  7:49 AM      Result Value Ref Range   Phosphorus 3.9  2.3 - 4.6 mg/dL  SEDIMENTATION RATE     Status: Abnormal   Collection Time    12/13/13 10:55 AM      Result Value Ref Range   Sed Rate 59 (*) 0 - 22 mm/hr  CK     Status: None   Collection Time    12/13/13 10:55 AM      Result Value Ref Range   Total CK 56  7 - 177 U/L  C-REACTIVE PROTEIN     Status: Abnormal   Collection Time    12/13/13 10:55 AM       Result Value Ref Range   CRP 11.6 (*) <0.60 mg/dL   Comment: Performed at [facility]     Status: None   Collection Time    12/13/13 10:55 AM      Result Value Ref Range   Cortisol, Plasma 25.4     Comment: (NOTE)     AM:  4.3 - 22.4 ug/dL     PM:  3.1 - 16.7 ug/dL     Performed at [facility]  CBC     Status: Abnormal   Collection Time    12/13/13 10:55 AM      Result Value Ref Range   WBC 7.8  4.0 - 10.5 K/uL   RBC 4.01  3.87 - 5.11 MIL/uL   Hemoglobin 11.6 (*) 12.0 - 15.0 g/dL   HCT 34.8 (*) 36.0 - 46.0 %   MCV 86.8  78.0 - 100.0 fL   MCH 28.9  26.0 - 34.0 pg   MCHC 33.3  30.0 - 36.0 g/dL   RDW 14.3  11.5 - 15.5 %   Platelets 303  150 - 400 K/uL  BASIC METABOLIC PANEL     Status: Abnormal   Collection Time    12/13/13 10:55 AM      Result Value Ref Range   Sodium 138  137 - 147 mEq/L   Potassium 3.7  3.7 - 5.3 mEq/L   Chloride 96  96 - 112 mEq/L   CO2 29  19 - 32 mEq/L   Glucose, Bld 121 (*) 70 - 99 mg/dL   BUN 14  6 - 23 mg/dL   Creatinine, Ser 0.80  0.50 - 1.10 mg/dL   Calcium 8.8  8.4 - 10.5 mg/dL   GFR calc non Af Amer 86 (*) >90 mL/min   GFR calc Af  Amer >90  >90 mL/min   Comment: (NOTE)     The eGFR has been calculated using the CKD EPI equation.     This calculation has not been validated in all clinical situations.     eGFR's persistently <90 mL/min signify possible Chronic Kidney     Disease.   Anion gap 13  5 - 15  RPR     Status: None   Collection Time    12/13/13 10:55 AM      Result Value Ref Range   RPR NON REAC  NON REAC   Comment: Performed at [facility] (ROUTINE TESTING)     Status: None   Collection Time    12/13/13 10:55 AM      Result Value Ref Range   HIV 1&2 Ab, 4th Generation NONREACTIVE  NONREACTIVE   Comment: (NOTE)     A NONREACTIVE HIV Ag/Ab result does not exclude HIV infection since     the time frame for seroconversion is variable. If acute HIV infection     is suspected, a  HIV-1 RNA Qualitative TMA test is recommended.     HIV-1/2 Antibody Diff         Not indicated.     HIV-1 RNA, Qual TMA           Not indicated.     PLEASE NOTE: This information has been disclosed to you from records     whose confidentiality may be protected by state law. If your state     requires such protection, then the state law prohibits you from making     any further disclosure of the information without the specific written     consent of the person to whom it pertains, or as otherwise permitted     by law. A general authorization for the release of medical or other     information is NOT sufficient for this purpose.     The performance of this assay has not been clinically validated in     patients less than 62 years old.     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  EXAM: CT CHEST, ABDOMEN, AND PELVIS WITH CONTRAST  TECHNIQUE: Multidetector CT imaging of the chest, abdomen and pelvis was performed following the standard protocol during bolus administration of intravenous contrast.  CONTRAST:  175m OMNIPAQUE IOHEXOL 300 MG/ML  SOLN  COMPARISON:  MRI of the cervical spine earlier today.  FINDINGS: CT CHEST FINDINGS  Within the chest, a right suprahilar mass measures approximately 3.0 x 2.7 x 2.2 cm. This either represents a nodal metastasis or central lung mass such as small cell carcinoma. A number of smaller mediastinal lymph nodes are also identified in the right paratracheal region, prevascular space and AP window.  Destructive lesion of the T3 vertebral body noted which nearly replaces the entire vertebral body with tumor extension posteriorly into the epidural space causing significant compression of the spinal cord. Paravertebral extension of tumor is also suspected lateral and anterior to the vertebral body, right greater than left.  Massive  enlargement of the thyroid gland present with transverse dimensions of approximately 7.7 x 8.5 cm. The thyroid gland is incompletely visualized as the neck was not included. Central dystrophic calcification identified. The enlarged thyroid gland does cause significant mass effect on the airway with evidence  of tracheal deviation to the right and tracheal narrowing at the thoracic inlet. The enlarged thyroid gland extends into the superior mediastinum.  There is evidence of pulmonary embolism with nonocclusive thrombus identified in the right middle lobe and lower lobe pulmonary arteries. Pulmonary arterial opacification is not optimal on the standard venous phase evaluation of the chest but the right sided pulmonary embolism is clearly present.  There are small bilateral pulmonary nodules. Right-sided pulmonary nodule measuring 6 mm likely localizes to the very top of the lower lobe near the major fissure. Left upper lobe nodule measures 7 mm. Lingular nodule measures 7 mm. Left lower lobe nodule measures 7 mm. Faint peripheral right middle lobe nodule measures 3 mm. Inferior right middle lobe nodule measures 6 mm. Several small nodules in the posterior right upper lobe identified in the 3-4 mm range.  No evidence of pleural or pericardial fluid. No other bony lesions identified in the thorax.  CT ABDOMEN AND PELVIS FINDINGS  Small low-density lesion in the left lobe of the liver measures 7 mm and likely represents a cyst. No other hepatic abnormalities. The gallbladder, pancreas, spleen, adrenal glands and kidneys are unremarkable. No adenopathy is identified in the abdomen or pelvis. Bowel shows normal caliber without evidence of obstruction, lesion or inflammation. No abnormal fluid collections.  The bladder is moderately distended. Elongated cystic changes in both adnexal regions identified which may represent a combination of ovarian cysts and potentially hydrosalpinx. Correlation with pelvic ultrasound is  recommended. No hernias are identified. The uterus appears unremarkable by CT. No bony lesions are identified in the abdomen or pelvis.  IMPRESSION: 1. 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 2. 3 cm right suprahilar mass may represent nodal metastasis versus primary central tumor. Next number multiple small bilateral pulmonary nodules consistent with metastatic disease. 3. Acute nonocclusive pulmonary embolism in the right middle lobe and right lower lobe pulmonary arteries. 4. Massive thyroid goiter causing tracheal deviation to the right and narrowing of the trachea. 5. Tubular cystic abnormalities of both adnexal regions which may be a combination of cystic changes in the ovaries and potentially hydrosalpinx. Correlation recommended with pelvic ultrasound to exclude any suspicious cystic lesions of the adnexae. 6. Critical Value/emergent results were called by telephone at the time of interpretation on 12/14/2013 at 4:52 pm to Dr. Tamala Julian of the [facility], who verbally acknowledged these results.   Electronically Signed   By: Aletta Edouard M.D.   On: 12/14/2013 16:53   Mr Jeri Cos BS Contrast  12/14/2013   ADDENDUM REPORT: 12/14/2013 10:07  ADDENDUM: Critical Value/emergent results were called by telephone at the time of interpretation on 12/14/2013 at 954 hr to [facility] Dr. Minda Ditto who verbally acknowledged these results.   Electronically Signed   By: Lars Pinks M.D.   On: 12/14/2013 10:07   12/14/2013   CLINICAL DATA:  48 year old female who presented with back pain and lower extremity numbness, with continued inadequately explained lower extremity weakness following Hospital discharge on 12/12/2013. Initial encounter.  EXAM: MRI HEAD WITHOUT AND WITH CONTRAST  MRI CERVICAL SPINE WITHOUT AND WITH CONTRAST  TECHNIQUE: Multiplanar, multiecho pulse sequences  of the brain and surrounding structures, and cervical spine, to include the craniocervical junction and cervicothoracic junction, were obtained without and with intravenous contrast.  CONTRAST:  20 mL MultiHance.  COMPARISON:  Lumbar MRI 12/10/2013.  FINDINGS: MRI HEAD FINDINGS  Cerebral volume is normal. No restricted diffusion to suggest acute infarction. No midline shift, mass effect, evidence of mass lesion, ventriculomegaly, extra-axial collection or acute intracranial hemorrhage. Cervicomedullary junction and pituitary are within normal limits. Major intracranial vascular flow voids are preserved.  Pearline Cables and white matter signal is within normal limits for age throughout the brain. No abnormal enhancement identified, pulsation artifact prominent on axial post-contrast images.  Large body habitus. Small right frontal scalp lipoma (series 10, image 37). Visible internal auditory structures appear normal. Mild right mastoid fluid. Minor paranasal sinus mucosal thickening. Visualized orbit soft tissues are within normal limits. Partially visible thyromegaly greater on the left, see below. Normal bone marrow signal.  MRI CERVICAL SPINE FINDINGS  Large body habitus resulting in wrap artifact on axial images. However, there is massive thyroid enlargement evident, with the left lobe measuring at least 7.2 x 5.2 x 11.2 cm (AP by transverse by CC). The trachea is displaced to the right at the thoracic inlet and mildly compressed. There is other regional mass effect including sheath. On the left carotid  Fairly preserved cervical lordosis. No marrow edema or evidence of acute osseous abnormality. Cervicomedullary junction is within normal limits. Spinal cord signal is within normal limits at all visualized levels. No abnormal intradural enhancement.  C2-C3:  Negative.  C3-C4: Disc osteophyte complex, facet and ligament flavum hypertrophy. Along with a degree of congenital canal narrowing, there is mild spinal stenosis and  moderate biforaminal stenosis.  C4-C5: Mild uncovertebral and facet hypertrophy. Mild right C5 foraminal stenosis.  C5-C6: Circumferential disc osteophyte complex. Along with a degree of congenital canal narrowing there is mild spinal stenosis. Moderate right C6 foraminal stenosis.  C6-C7:  Negative except for mild facet hypertrophy.  C7-T1:  Negative.  T1-T2: Negative.  Faintly visualized at the T3 level in the upper thoracic spine there is a lobulated solidly enhancing mass which appears to extend into the epidural space from the T3 vertebral body. This is inseparable from the spinal cord which is likely compressed. See series 2000 image 7 and series 1500, image 6. This is not included on axial images.  IMPRESSION: 1. Faintly visible but aggressive appearing enhancing mass at the T3 level of the upper thoracic spine, likely with spinal cord compression evaluate further are with thoracic MRI without and with contrast. 2. Normal for age MRI appearance of the brain. 3. Massive thyroid enlargement, greater on the left. Left thyroid goiter/mass measuring at least 7.2 x 5.2 x 11.2 cm. Mass effect at the thoracic inlet including on the trachea. 4. Mild chronic disc and endplate degeneration in the cervical spine at C3-C4 and C5-C6 resulting in borderline to mild spinal stenosis, but no cervical spinal cord mass effect or signal abnormality.  Electronically Signed: By: Lars Pinks M.D. On: 12/14/2013 09:50   Mr Thoracic Spine Wo Contrast  12/14/2013   CLINICAL DATA:  Lower extremity weakness. Worsening back pain. Compressive upper thoracic mass partially visualized on cervical spine MRI as well as chest CT with spinal cord compression.  EXAM: MRI THORACIC SPINE WITHOUT CONTRAST  TECHNIQUE: Multiplanar, multisequence MR imaging of the thoracic spine was performed. No intravenous contrast was administered.  COMPARISON:  Cervical spine MRI and chest CT earlier today  FINDINGS: There is a T3 compression fracture with  approximately 50% height loss centrally. As described on cervical spine MRI and chest CT earlier today, there is abnormal soft tissue replacing the majority of the T3 vertebral body extending into the pedicles as well as into  the ventral epidural space with moderate cord compression. Epidural extension is slightly greater on the right and extends into the inferior aspect of the right T2-3 neural foramen as well as superior aspect of the right T3-4 neural foramen. There is also abnormal paravertebral soft tissue laterally and anteriorly as seen on CT. Central ventral epidural material posterior to the T4 vertebral body may represent tumor or hematoma. Mild T2 hyperintensity is present in the spinal cord at the T3 level extending inferiorly to the T4-5 disc space level consistent with mild edema. Mild edema is present in the posterior paraspinal soft tissues of the upper thoracic spine. No other vertebral body lesions are identified.  There is a moderate-sized central disc extrusion at T7-8 which contacts and mildly flattens the ventral spinal cord with mild spinal stenosis. A small left central disc protrusion is present at T8-9 with at most minimal impression on the spinal cord. Massive enlargement of the left thyroid lobe is partially visualized. Mediastinal and lung findings are better demonstrated on recent chest CT.  IMPRESSION: 1. T3 vertebral body mass with pathologic compression fracture and epidural tumor resulting in moderate cord compression with evidence of mild spinal cord edema. 2. T7-8 disc extrusion resulting in mild spinal stenosis.   Electronically Signed   By: Logan Bores   On: 12/14/2013 19:26   Mr Cervical Spine W Wo Contrast  12/14/2013   ADDENDUM REPORT: 12/14/2013 10:07  ADDENDUM: Critical Value/emergent results were called by telephone at the time of interpretation on 12/14/2013 at 954 hr to [facility] Dr. Minda Ditto who verbally acknowledged these results.   Electronically Signed   By:  Lars Pinks M.D.   On: 12/14/2013 10:07   12/14/2013   CLINICAL DATA:  48 year old female who presented with back pain and lower extremity numbness, with continued inadequately explained lower extremity weakness following Hospital discharge on 12/12/2013. Initial encounter.  EXAM: MRI HEAD WITHOUT AND WITH CONTRAST  MRI CERVICAL SPINE WITHOUT AND WITH CONTRAST  TECHNIQUE: Multiplanar, multiecho pulse sequences of the brain and surrounding structures, and cervical spine, to include the craniocervical junction and cervicothoracic junction, were obtained without and with intravenous contrast.  CONTRAST:  20 mL MultiHance.  COMPARISON:  Lumbar MRI 12/10/2013.  FINDINGS: MRI HEAD FINDINGS  Cerebral volume is normal. No restricted diffusion to suggest acute infarction. No midline shift, mass effect, evidence of mass lesion, ventriculomegaly, extra-axial collection or acute intracranial hemorrhage. Cervicomedullary junction and pituitary are within normal limits. Major intracranial vascular flow voids are preserved.  Pearline Cables and white matter signal is within normal limits for age throughout the brain. No abnormal enhancement identified, pulsation artifact prominent on axial post-contrast images.  Large body habitus. Small right frontal scalp lipoma (series 10, image 37). Visible internal auditory structures appear normal. Mild right mastoid fluid. Minor paranasal sinus mucosal thickening. Visualized orbit soft tissues are within normal limits. Partially visible thyromegaly greater on the left, see below. Normal bone marrow signal.  MRI CERVICAL SPINE FINDINGS  Large body habitus resulting in wrap artifact on axial images. However, there is massive thyroid enlargement evident, with the left lobe measuring at least 7.2 x 5.2 x 11.2 cm (AP by transverse by CC). The trachea is displaced to the right at the thoracic inlet and mildly compressed. There is other regional mass effect including sheath. On the left carotid  Fairly  preserved cervical lordosis. No marrow edema or evidence of acute osseous abnormality. Cervicomedullary junction is within normal limits. Spinal cord signal is within normal limits at  all visualized levels. No abnormal intradural enhancement.  C2-C3:  Negative.  C3-C4: Disc osteophyte complex, facet and ligament flavum hypertrophy. Along with a degree of congenital canal narrowing, there is mild spinal stenosis and moderate biforaminal stenosis.  C4-C5: Mild uncovertebral and facet hypertrophy. Mild right C5 foraminal stenosis.  C5-C6: Circumferential disc osteophyte complex. Along with a degree of congenital canal narrowing there is mild spinal stenosis. Moderate right C6 foraminal stenosis.  C6-C7:  Negative except for mild facet hypertrophy.  C7-T1:  Negative.  T1-T2: Negative.  Faintly visualized at the T3 level in the upper thoracic spine there is a lobulated solidly enhancing mass which appears to extend into the epidural space from the T3 vertebral body. This is inseparable from the spinal cord which is likely compressed. See series 2000 image 7 and series 1500, image 6. This is not included on axial images.  IMPRESSION: 1. Faintly visible but aggressive appearing enhancing mass at the T3 level of the upper thoracic spine, likely with spinal cord compression evaluate further are with thoracic MRI without and with contrast. 2. Normal for age MRI appearance of the brain. 3. Massive thyroid enlargement, greater on the left. Left thyroid goiter/mass measuring at least 7.2 x 5.2 x 11.2 cm. Mass effect at the thoracic inlet including on the trachea. 4. Mild chronic disc and endplate degeneration in the cervical spine at C3-C4 and C5-C6 resulting in borderline to mild spinal stenosis, but no cervical spinal cord mass effect or signal abnormality.  Electronically Signed: By: Lars Pinks M.D. On: 12/14/2013 09:50   Ct Abdomen Pelvis W Contrast  12/14/2013   CLINICAL DATA:  Destructive metastatic lesion of the T3  vertebral body with no known primary carcinoma.  EXAM: CT CHEST, ABDOMEN, AND PELVIS WITH CONTRAST  TECHNIQUE: Multidetector CT imaging of the chest, abdomen and pelvis was performed following the standard protocol during bolus administration of intravenous contrast.  CONTRAST:  116m OMNIPAQUE IOHEXOL 300 MG/ML  SOLN  COMPARISON:  MRI of the cervical spine earlier today.  FINDINGS: CT CHEST FINDINGS  Within the chest, a right suprahilar mass measures approximately 3.0 x 2.7 x 2.2 cm. This either represents a nodal metastasis or central lung mass such as small cell carcinoma. A number of smaller mediastinal lymph nodes are also identified in the right paratracheal region, prevascular space and AP window.  Destructive lesion of the T3 vertebral body noted which nearly replaces the entire vertebral body with tumor extension posteriorly into the epidural space causing significant compression of the spinal cord. Paravertebral extension of tumor is also suspected lateral and anterior to the vertebral body, right greater than left.  Massive enlargement of the thyroid gland present with transverse dimensions of approximately 7.7 x 8.5 cm. The thyroid gland is incompletely visualized as the neck was not included. Central dystrophic calcification identified. The enlarged thyroid gland does cause significant mass effect on the airway with evidence of tracheal deviation to the right and tracheal narrowing at the thoracic inlet. The enlarged thyroid gland extends into the superior mediastinum.  There is evidence of pulmonary embolism with nonocclusive thrombus identified in the right middle lobe and lower lobe pulmonary arteries. Pulmonary arterial opacification is not optimal on the standard venous phase evaluation of the chest but the right sided pulmonary embolism is clearly present.  There are small bilateral pulmonary nodules. Right-sided pulmonary nodule measuring 6 mm likely localizes to the very top of the lower lobe  near the major fissure. Left upper lobe nodule measures 7 mm.  Lingular nodule measures 7 mm. Left lower lobe nodule measures 7 mm. Faint peripheral right middle lobe nodule measures 3 mm. Inferior right middle lobe nodule measures 6 mm. Several small nodules in the posterior right upper lobe identified in the 3-4 mm range.  No evidence of pleural or pericardial fluid. No other bony lesions identified in the thorax.  CT ABDOMEN AND PELVIS FINDINGS  Small low-density lesion in the left lobe of the liver measures 7 mm and likely represents a cyst. No other hepatic abnormalities. The gallbladder, pancreas, spleen, adrenal glands and kidneys are unremarkable. No adenopathy is identified in the abdomen or pelvis. Bowel shows normal caliber without evidence of obstruction, lesion or inflammation. No abnormal fluid collections.  The bladder is moderately distended. Elongated cystic changes in both adnexal regions identified which may represent a combination of ovarian cysts and potentially hydrosalpinx. Correlation with pelvic ultrasound is recommended. No hernias are identified. The uterus appears unremarkable by CT. No bony lesions are identified in the abdomen or pelvis.  IMPRESSION: 1. 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 2. 3 cm right suprahilar mass may represent nodal metastasis versus primary central tumor. Next number multiple small bilateral pulmonary nodules consistent with metastatic disease. 3. Acute nonocclusive pulmonary embolism in the right middle lobe and right lower lobe pulmonary arteries. 4. Massive thyroid goiter causing tracheal deviation to the right and narrowing of the trachea. 5. Tubular cystic abnormalities of both adnexal regions which may be a combination of cystic changes in the ovaries and potentially hydrosalpinx. Correlation recommended with pelvic ultrasound to  exclude any suspicious cystic lesions of the adnexae. 6. Critical Value/emergent results were called by telephone at the time of interpretation on 12/14/2013 at 4:52 pm to Dr. Tamala Julian of the [facility] Medicine Teaching Service, who verbally acknowledged these results.   Electronically Signed   By: Aletta Edouard M.D.   On: 12/14/2013 16:53    Review of Systems  HENT: Negative.   Eyes: Negative.   Respiratory: Negative.   Cardiovascular: Negative.   Gastrointestinal: Positive for constipation.  Genitourinary:       Inability to urinate since   Musculoskeletal: Positive for falls.  Skin: Negative.   Neurological: Positive for sensory change, focal weakness and weakness.   Blood pressure 130/74, pulse 97, temperature 98.4 F (36.9 C), temperature source Oral, resp. rate 16, height _0  (1.803 m), weight 141.794 kg (312 lb 9.6 oz), last menstrual period 11/28/2013, SpO2 99.00%. Physical Exam  Constitutional: She is oriented to person, place, and time. She appears well-developed and well-nourished.  HENT:  Head: Normocephalic and atraumatic.  Right Ear: External ear normal.  Left Ear: External ear normal.  Nose: Nose normal.  Mouth/Throat: Oropharynx is clear and moist.  Eyes: Conjunctivae and EOM are normal. Pupils are equal, round, and reactive to light.  Neck: Normal range of motion. Thyromegaly present.  Cardiovascular: Normal rate, regular rhythm and normal heart sounds.   Respiratory: Effort normal and breath sounds normal.  Neurological: She is alert and oriented to person, place, and time. A sensory deficit is present. No cranial nerve deficit.  0/5 hip flexors, quads, hams, dorsiflexors, plantar flexors No proprioception at great toes bilaterally, intact though diminished at ankles bilaterally, light touch not intact below T4 level Unable to walk     Assessment/Plan: Emergent OR for thoracic laminectomy and decompression , and tissue diagnosis At T3. Possible stabilization as a  second stage procedure. Risks and benefits were explained, continued paralysis, no bowel/ bladder function, bleeding, infection, need for further surgery, along with other risks. She and her family asked questions which were answered. Sarah [name] would like to proceed with operative decompression.   Armonie Staten L 12/14/2013, 9:13 PM

## 2013-12-14 NOTE — H&P (Signed)
Pt.to OR in bed in fair condition.With saline lock on rt.FA .& Foley cath.in place.

## 2013-12-14 NOTE — Progress Notes (Signed)
FMTS Attending Note Patient seen and examined by me immediately after returning from MRI. Case discussed with resident team, and I agree with Dr Melancon's note. It is noted that MRI shows enhancing mass at T3 with likely spinal cord compression.  The house staff has discussed this finding with patient, as well as plan for more dedicated T-spine imaging, and CT chest/abd/pelvis in the event this T-spine lesion represents metastasis.  Neurosurgery consult to be called. Dalbert Mayotte, MD

## 2013-12-14 NOTE — Progress Notes (Signed)
Inpatient Diabetes Program Recommendations  AACE/ADA: New Consensus Statement on Inpatient Glycemic Control (2013)  Target Ranges:  Prepandial:   less than 140 mg/dL      Peak postprandial:   less than 180 mg/dL (1-2 hours)      Critically ill patients:  140 - 180 mg/dL     Results for Sarah [name], Sarah [name] (MRN [id number]) as of 12/14/2013 13:51  Ref. Range 12/13/2013 07:49  Hemoglobin A1C Latest Range: <5.7 % 6.7 (H)     **Note that patient does not have a formal diagnosis of DM.  A1c of 6.7%.  Per ADA Standards of Care, A1c of 6.5% or greater is positive indicator of DM.   **MD- Is this a new diagnosis of DM?  If so, RNs will need to begin basic DM education with patient before discharge    Will follow along [name] RN, MSN, CDE Diabetes Coordinator Inpatient Diabetes Program Team Pager: [phone] (8a-10p)

## 2013-12-14 NOTE — Progress Notes (Signed)
Family Medicine Teaching Service Daily Progress Note Intern Pager: [phone]  Patient name: Sarah [name] Medical record number: [id number] Date of birth: [birth year] Age: 48 y.o. Gender: female  Primary Care Provider: No PCP Per Patient Consultants: None Code Status: Full  Pt Overview and Major Events to Date:  - 9/22: Pt. Admitted with lower extremity weakness.  - 9/23: Pt. Found to have mass at T-3 level with spinal compression.   Assessment and Plan: Sarah [name] is a 48 y.o. female presenting with continued lower extremity weakness at baseline after discharge from hospital on 9/21. PMH is significant for obesity, HTN, and ?History of Hypothyroidism previously on Synthroid per patient.   #Acute on chronic LBP with inability to stand- At this point etiology continues to remain unclear though most likely dx is some nerve root compression with lumbar lateral recess and foraminal narrowing seen on 9/19 lumbar MRI, with contribution from obesity and psychological process. She has had chronic back pain, progressively worsening since 9/16 with lower extremity paresthesias extending subjectively up to the level of the upper abdomen, pt unable to bear weight vs true balance disorder. This occurs in the setting of known DDD with potential nerve root compression (history of chronic back pain, but unclear whether this is acute vs chronic, weight likely contributing) Vs. Potential prodromal viral illness (though not convincing based on history), or other infectious etiology (no abscess visualized on previous CT/MRI of spine, no fever, no white count); no acute trauma (other than pulled mm 6 weeks ago); at this point also consider potential intracranial pathology (exam at prior admission with equiv babinski's but no hyperreflexia, nml cerebellar testing nml CN and UE, would be an unusual presentation of MS given isolated event, ?NPH given subjective incontinence and difficulty with ambulation, ?Abnormal late  stage presentation of genetic disorder i.e. Adrenomyeloneuropathy) vs thoracic spine compression (pain mostly located in this area however not location of peripheral roots enervating legs, would be abnormal; No dermatomal level in abdomen) vs demyelinating process (neg spinal MRI) vs mm spasm and body habitus, History of ?Hypothyroidism (enlarged / full thyroid noted on examination, will check TSH), Will additionally check B12 level, though this is an unlikely presentation of this as well. Rather acute to be a polyneuropathy presentation (i.e. GBS). Must also consider myelopathy (with paraparesia, sensory level, ?bowel incontinence. However, no obvious UMN signs and unlikely per neuro recent eval last hospitalization. Additionally, per Neuro she may need a psychiatric evaluation for potential conversion disorder vs. Major depressive episode.  - Admit to obs under Dr Nori Riis  - PT recommendations SNF with 24hour supervision.  - Plan for MRI of Brain and C-spine today. >> Mass at T3 level incompletely visualized. Compressing cord. Stat T-spine MRI ordered.  - Consult to neurosurgery  - Plan for CT Chest, Abd, Pelvis to evaluate for other primary.  - flexeril for mm spasm  - i/o cath or bed pan if able while unable to walk  - bed rest or UOB with at least a 2 assist  - A1c - 6.7 unlikely to be contributing at this time.  - TSH, B12 Level, Mg, Phos - all relatively normal  - Random cortisol - Nl.  - ESR elevated, CRP elevted nonspecific but now concerning given new lesion on spine, and potential for mets.   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 PCOS vs. Adrenal pathology?[name] Kitchen Would be unlike to be first presentation of HPA pathology;  no evidence of salt wasting on labs. Hypertensive and not hypotensive. Testing during this hospitalization after having recently received steroids would be largely non-diagnostic. Though, could also be a  combination of isolated processes i.e. Clitoromegaly, Hirsutism 2/2 obesity. Will need ongoing outpatient evaluation.  - Recent thought of outpt vs inpt w/up including [place] to assess ovaries vs 21-hydroxylase/ACTH/cortisol.  - TSH, Cortisol WNL - A1C : 6.7 - consistent with likely PCOS.   #HTN- BP largely uncontrolled 150s-180s/70s-90s; previously on BP med that made her feel badly? Unclear what kind  -started with losartan 12.41m at last admission.  -continue losartan here.  -vitals per floor protocol  - Blood pressure well controlled.   #Obesity- believe this is largely contributing to ambulation issues  -needs aggressive dietary and exercise intervention  - consistent with potential PCOS.   FEN/GI: Reg diet; KVO; no stool in 4-5 days with large stool burden and moderate abdominal distension on exam  - miralax daily  Prophylaxis: SCD's for now.  Disposition: Will discuss with Neurology vs. Psychiatry today. Likely discharge soon.   Subjective:  No complaints overnight. Continued lower extremity weakness, and pain in her upper thoracic spine.   Objective: Temp:  [98.2 F (36.8 C)-98.6 F (37 C)] 98.6 F (37 C) (09/23 0550) Pulse Rate:  [81-86] 86 (09/23 0550) Resp:  [14-18] 14 (09/23 0550) BP: (122-129)/(68-77) 127/77 mmHg (09/23 0550) SpO2:  [99 %-100 %] 99 % (09/23 0550) Physical Exam: General: NAD, AAOx3, morbidly obese  HEENT: EOMI, Thyromegaly / Goiter noted on exam without tenderness, and no nodules, no LAD, neck supple  Abdomen: Soft, nontender, moderately distended, No organomegaly though exam limited by body habitus, +BS Extremities: 2+ posterior tibial pulses bilaterally, 2+ radial pulses bilaterally, no gross abnormalities noted. Somewhat cool bilateral feet.  Skin: No lesions or rashes noted  Neuro: CNII-XII intact, Normal speech, Motor: 2-/5 motor weakness in bilateral lower extremities grossly, 5/5 motor BL upper extremities, subjective decreased sensation to light  touch and pressure from upper abdomen down through bilateral lower extremities into feet, No Saddle anesthesia, full rectal tone per initial exam, Unable to assess gait due to pt. inability to ambulate at this time.   Laboratory:  Recent Labs Lab 12/10/13 0950 12/12/13 1310 12/13/13 1055  WBC 9.4 8.9 7.8  HGB 11.6* 11.6* 11.6*  HCT 34.5* 34.4* 34.8*  PLT 211 306 303    Recent Labs Lab 12/10/13 0950 12/12/13 1310 12/13/13 1055  NA 136* 139 138  K 3.8 3.9 3.7  CL 95* 96 96  CO2 _0 BUN _1 CREATININE 0.87 0.85 0.80  CALCIUM 8.9 9.1 8.8  GLUCOSE 138* 113* 121*   Urinalysis    Component Value Date/Time   COLORURINE YELLOW 12/10/2013 1036   APPEARANCEUR CLOUDY* 12/10/2013 1036   LABSPEC 1.022 12/10/2013 1036   PHURINE 5.5 12/10/2013 1036   GLUCOSEU NEGATIVE 12/10/2013 1036   HGBUR NEGATIVE 12/10/2013 1036   BILIRUBINUR NEGATIVE 12/10/2013 1036   KETONESUR NEGATIVE 12/10/2013 1036   PROTEINUR NEGATIVE 12/10/2013 1036   UROBILINOGEN 1.0 12/10/2013 1036   NITRITE NEGATIVE 12/10/2013 1036   LEUKOCYTESUR NEGATIVE 12/10/2013 1036    Phos - 3.9 Mg - 2.2 CRP - 11.6 ESR - 59 CK - 56 B12 - 209 Random Cortisol - 25.4 A1C - 6.7 TSH - 2.13 RPR - nonreactive HIV - nonreactive UPT - Negative  Imaging/Diagnostic Tests:  MRI Head / C-Spine 9/23:  CLINICAL DATA: 48year old female who presented with back pain and  lower  extremity numbness, with continued inadequately explained  lower extremity weakness following Hospital discharge on 12/12/2013.  Initial encounter.  EXAM:  MRI HEAD WITHOUT AND WITH CONTRAST  MRI CERVICAL SPINE WITHOUT AND WITH CONTRAST  TECHNIQUE:  Multiplanar, multiecho pulse sequences of the brain and surrounding  structures, and cervical spine, to include the craniocervical  junction and cervicothoracic junction, were obtained without and  with intravenous contrast.  CONTRAST: 20 mL MultiHance.  COMPARISON: Lumbar MRI 12/10/2013.  FINDINGS:   MRI HEAD FINDINGS  Cerebral volume is normal. No restricted diffusion to suggest acute  infarction. No midline shift, mass effect, evidence of mass lesion,  ventriculomegaly, extra-axial collection or acute intracranial  hemorrhage. Cervicomedullary junction and pituitary are within  normal limits. Major intracranial vascular flow voids are preserved.  Pearline Cables and white matter signal is within normal limits for age  throughout the brain. No abnormal enhancement identified, pulsation  artifact prominent on axial post-contrast images.  Large body habitus. Small right frontal scalp lipoma (series 10,  image 37). Visible internal auditory structures appear normal. Mild  right mastoid fluid. Minor paranasal sinus mucosal thickening.  Visualized orbit soft tissues are within normal limits. Partially  visible thyromegaly greater on the left, see below. Normal bone  marrow signal.  MRI CERVICAL SPINE FINDINGS  Large body habitus resulting in wrap artifact on axial images.  However, there is massive thyroid enlargement evident, with the left  lobe measuring at least 7.2 x 5.2 x 11.2 cm (AP by transverse by  CC). The trachea is displaced to the right at the thoracic inlet and  mildly compressed. There is other regional mass effect including  sheath. On the left carotid  Fairly preserved cervical lordosis. No marrow edema or evidence of  acute osseous abnormality. Cervicomedullary junction is within  normal limits. Spinal cord signal is within normal limits at all  visualized levels. No abnormal intradural enhancement.  C2-C3: Negative.  C3-C4: Disc osteophyte complex, facet and ligament flavum  hypertrophy. Along with a degree of congenital canal narrowing,  there is mild spinal stenosis and moderate biforaminal stenosis.  C4-C5: Mild uncovertebral and facet hypertrophy. Mild right C5  foraminal stenosis.  C5-C6: Circumferential disc osteophyte complex. Along with a degree  of congenital canal  narrowing there is mild spinal stenosis.  Moderate right C6 foraminal stenosis.  C6-C7: Negative except for mild facet hypertrophy.  C7-T1: Negative.  T1-T2: Negative.  Faintly visualized at the T3 level in the upper thoracic spine there  is a lobulated solidly enhancing mass which appears to extend into  the epidural space from the T3 vertebral body. This is inseparable  from the spinal cord which is likely compressed. See series 2000  image 7 and series 1500, image 6. This is not included on axial  images.  IMPRESSION:  1. Faintly visible but aggressive appearing enhancing mass at the T3  level of the upper thoracic spine, likely with spinal cord  compression evaluate further are with thoracic MRI without and with  contrast.  2. Normal for age MRI appearance of the brain.  3. Massive thyroid enlargement, greater on the left. Left thyroid  goiter/mass measuring at least 7.2 x 5.2 x 11.2 cm. Mass effect at  the thoracic inlet including on the trachea.  4. Mild chronic disc and endplate degeneration in the cervical spine  at C3-C4 and C5-C6 resulting in borderline to mild spinal stenosis,  but no cervical spinal cord mass effect or signal abnormality.  Electronically Signed:  By: Lezlie Octave.D.  On: 12/14/2013 09:50   MRI L-Spine 9/19:  CLINICAL DATA: Back pain with bilateral leg numbness, worsening  with oral steroids.  EXAM:  MRI LUMBAR SPINE WITHOUT CONTRAST  TECHNIQUE:  Multiplanar, multisequence MR imaging of the lumbar spine was  performed. No intravenous contrast was administered.  COMPARISON: CT 12/09/2013  FINDINGS:  There is no degenerative disease at L2-3 or above. The discs are  normal in signal characteristics and morphology. The spinal canal  and foramina are widely patent. The distal cord and conus are normal  with conus tip at L1. There are small benign and insignificant  hemangiomas within the L2 and L3 vertebral bodies.  L3-4: Minimal desiccation of the disc.  Mild circumferential bulging  slightly more prominent towards the left. Bilateral facet  degeneration and hypertrophy. Mild narrowing of the lateral recesses  without apparent neural compression. Mild foraminal narrowing on the  left without apparent neural compression.  L4-5: Desiccation of the disc with circumferential protrusion.  Bilateral facet and ligamentous hypertrophy. Stenosis of both  lateral recesses without definite focal neural compression. Mild  foraminal narrowing bilaterally.  L5-S1: Desiccation of the disc with loss of height. Endplate  osteophytes and bulging of the disc more prominent towards the left.  Bilateral facet degeneration and hypertrophy left more than right.  Stenosis of the subarticular lateral recess on the left that could  compress the S1 nerve root. Mild foraminal narrowing left more than  right but without apparent compression of the exiting L5 nerve  roots.  Serpiginous fluid-filled structure in the left pelvis could  represent hydrosalpinx. This is not primarily or completely imaged.  IMPRESSION:  Degenerative disc disease and degenerative facet disease in the  lower lumbar spine that could be a cause of lumbago.  Lateral recess and foraminal narrowing at L3-4, L4-5 and L5-S1 as  described above that could be a cause of focal neural compression.  In general, this is more pronounced on the left than on the right.  In particular, there is potential for left S1 nerve root compression  in the subarticular lateral recess at L5-S1.  Electronically Signed  By: Nelson Chimes M.D.  On: 12/10/2013 08:45   CT L-Spine 9/18:  CLINICAL DATA: Low back pain with difficulty walking.  EXAM:  CT LUMBAR SPINE WITHOUT CONTRAST  TECHNIQUE:  Multidetector CT imaging of the lumbar spine was performed without  intravenous contrast administration. Multiplanar CT image  reconstructions were also generated.  COMPARISON: None.  FINDINGS:  There is no fracture or  spondylolisthesis. There is moderately  severe disc space narrowing at L5-S1 with slight vacuum phenomenon  at this level. There is moderate narrowing at L4-5. There are  prominent anterior osteophytes at L2, L3, L4, and L5.  At T12-L1, there is moderate facet hypertrophy bilaterally. There is  no nerve root edema or effacement. No disc extrusion or stenosis.  At L1-2, there is moderate facet hypertrophy bilaterally. There is  mild diffuse disc bulging. There is no nerve root edema or  effacement. No disc extrusion or stenosis.  At L2-3, there is broad-based disc bulging and moderate facet  hypertrophy bilaterally. There is borderline narrowing of the thecal  sac with borderline spinal stenosis. No disc extrusion.  At L3-4, there is severe facet osteoarthritic change bilaterally,  more pronounced on the left than on the right. There is broad-based  disc bulging and moderate ligamentum flavum hypertrophy. These  findings in concert lead to moderate generalized spinal stenosis.  At L4-5, there is broad-based disc protrusion  and severe facet  osteoarthritic change bilaterally. These findings in concert lead to  moderate generalized spinal stenosis. No frank disc extrusion.  At L5-S1, there is severe facet osteoarthritic change on the left  and moderate facet osteoarthritic change on the right. There is  broad-based disc protrusion. There is exit foraminal narrowing  bilaterally, more severe on the left than on the right, due to  osteophytic change. No frank disc extrusion or stenosis is seen at  this level.  There are no paraspinous lesions.  IMPRESSION:  Spinal stenosis at L3-4 and L4-5, multifactorial. Borderline spinal  stenosis at L2-3. There does appear to be a degree of congenital  narrowing of the canal which contributes to spinal stenosis.  Multilevel arthropathy.  No frank disc extrusion.  No fracture or spondylolisthesis.  Electronically Signed  By: Lowella Grip M.D.   On: 12/09/2013 13:50   CXR 9/13:  CLINICAL DATA: Upper back pain for 5 weeks. Dyspnea on exertion.  Nonsmoker. History of hypertension.  EXAM:  CHEST 2 VIEW  COMPARISON: 04/15/2006  FINDINGS:  The heart is mildly enlarged. There is elevation of right  hemidiaphragm, stable in appearance. There is streaky density  favored to be within the right lower lobe, consistent with  atelectasis or infiltrate. No pulmonary edema. No pleural effusions.  There are moderate degenerative changes in the thoracic spine.  IMPRESSION:  Right lower lobe atelectasis or infiltrate.  Electronically Signed  By: Shon Hale M.D.  On: 12/04/2013 19:26   Aquilla Hacker, MD 12/14/2013, 7:47 AM PGY-1, [place] Intern pager: [phone], text pages welcome

## 2013-12-14 NOTE — Anesthesia Procedure Notes (Signed)
Procedure Name: Intubation Date/Time: 12/14/2013 9:56 PM Performed by: Manuela Schwartz B Pre-anesthesia Checklist: Patient identified, Emergency Drugs available, Suction available, Patient being monitored and Timeout performed Patient Re-evaluated:Patient Re-evaluated prior to inductionOxygen Delivery Method: Circle system utilized Preoxygenation: Pre-oxygenation with 100% oxygen Intubation Type: IV induction and Rapid sequence Grade View: Grade I Tube type: Oral Tube size: 7.5 mm Number of attempts: 1 Airway Equipment and Method: Stylet and Video-laryngoscopy (elective glidescope intubation) Placement Confirmation: ETT inserted through vocal cords under direct vision,  positive ETCO2 and breath sounds checked- equal and bilateral Secured at: 22 cm Tube secured with: Tape Dental Injury: Teeth and Oropharynx as per pre-operative assessment

## 2013-12-14 NOTE — Anesthesia Preprocedure Evaluation (Signed)
Anesthesia Evaluation  Patient identified by MRN, date of birth, ID band Patient awake    Reviewed: Allergy & Precautions, H&P , NPO status , Patient's Chart, lab work & pertinent test results  Airway Mallampati: II  Neck ROM: full    Dental   Pulmonary          Cardiovascular hypertension,     Neuro/Psych    GI/Hepatic   Endo/Other  Morbid obesity  Renal/GU      Musculoskeletal  (+) Arthritis -,   Abdominal   Peds  Hematology   Anesthesia Other Findings   Reproductive/Obstetrics                           Anesthesia Physical Anesthesia Plan  ASA: II and emergent  Anesthesia Plan: General   Post-op Pain Management:    Induction: Intravenous  Airway Management Planned: Oral ETT  Additional Equipment:   Intra-op Plan:   Post-operative Plan: Extubation in OR  Informed Consent: I have reviewed the patients History and Physical, chart, labs and discussed the procedure including the risks, benefits and alternatives for the proposed anesthesia with the patient or authorized representative who has indicated his/her understanding and acceptance.     Plan Discussed with: CRNA, Anesthesiologist and Surgeon  Anesthesia Plan Comments:         Anesthesia Quick Evaluation

## 2013-12-15 ENCOUNTER — Inpatient Hospital Stay (HOSPITAL_COMMUNITY): Payer: Medicaid Other

## 2013-12-15 DIAGNOSIS — Z862 Personal history of diseases of the blood and blood-forming organs and certain disorders involving the immune mechanism: Secondary | ICD-10-CM

## 2013-12-15 DIAGNOSIS — Z8639 Personal history of other endocrine, nutritional and metabolic disease: Secondary | ICD-10-CM

## 2013-12-15 DIAGNOSIS — R29898 Other symptoms and signs involving the musculoskeletal system: Secondary | ICD-10-CM

## 2013-12-15 DIAGNOSIS — I2699 Other pulmonary embolism without acute cor pulmonale: Secondary | ICD-10-CM

## 2013-12-15 DIAGNOSIS — C7951 Secondary malignant neoplasm of bone: Secondary | ICD-10-CM | POA: Diagnosis present

## 2013-12-15 DIAGNOSIS — D492 Neoplasm of unspecified behavior of bone, soft tissue, and skin: Secondary | ICD-10-CM

## 2013-12-15 DIAGNOSIS — M546 Pain in thoracic spine: Secondary | ICD-10-CM

## 2013-12-15 LAB — COMPREHENSIVE METABOLIC PANEL
ALBUMIN: 2.9 g/dL — AB (ref 3.5–5.2)
ALK PHOS: 118 U/L — AB (ref 39–117)
ALT: 19 U/L (ref 0–35)
ANION GAP: 15 (ref 5–15)
AST: 30 U/L (ref 0–37)
BUN: 18 mg/dL (ref 6–23)
CALCIUM: 8.5 mg/dL (ref 8.4–10.5)
CO2: 24 mEq/L (ref 19–32)
Chloride: 97 mEq/L (ref 96–112)
Creatinine, Ser: 1.08 mg/dL (ref 0.50–1.10)
GFR calc Af Amer: 70 mL/min — ABNORMAL LOW (ref >90)
GFR calc non Af Amer: 60 mL/min — ABNORMAL LOW (ref >90)
Glucose, Bld: 216 mg/dL — ABNORMAL HIGH (ref 70–99)
POTASSIUM: 4.5 meq/L (ref 3.7–5.3)
Sodium: 136 mEq/L — ABNORMAL LOW (ref 137–147)
Total Bilirubin: 1.4 mg/dL — ABNORMAL HIGH (ref 0.3–1.2)
Total Protein: 7 g/dL (ref 6.0–8.3)

## 2013-12-15 LAB — CBC
HEMATOCRIT: 31.2 % — AB (ref 36.0–46.0)
HEMOGLOBIN: 10.6 g/dL — AB (ref 12.0–15.0)
MCH: 28.6 pg (ref 26.0–34.0)
MCHC: 34 g/dL (ref 30.0–36.0)
MCV: 84.1 fL (ref 78.0–100.0)
Platelets: 244 10*3/uL (ref 150–400)
RBC: 3.71 MIL/uL — AB (ref 3.87–5.11)
RDW: 13.9 % (ref 11.5–15.5)
WBC: 9.3 10*3/uL (ref 4.0–10.5)

## 2013-12-15 LAB — MRSA PCR SCREENING: MRSA by PCR: NEGATIVE

## 2013-12-15 LAB — GLUCOSE, CAPILLARY: Glucose-Capillary: 176 mg/dL — ABNORMAL HIGH (ref 70–99)

## 2013-12-15 MED ORDER — HYDROMORPHONE HCL 1 MG/ML IJ SOLN: 0.2500 mg | INTRAMUSCULAR | Status: DC | PRN

## 2013-12-15 MED ORDER — ACETAMINOPHEN 325 MG PO TABS
650.0000 mg | ORAL_TABLET | ORAL | Status: DC | PRN
  Administered 2013-12-15 – 2014-01-19 (×54): 650 mg via ORAL
  Filled 2013-12-15 (×54): qty 2

## 2013-12-15 MED ORDER — SODIUM CHLORIDE 0.9 % IJ SOLN: 3.0000 mL | INTRAMUSCULAR | Status: DC | PRN

## 2013-12-15 MED ORDER — ONDANSETRON HCL 4 MG/2ML IJ SOLN
4.0000 mg | Freq: Four times a day (QID) | INTRAMUSCULAR | Status: AC | PRN
  Administered 2013-12-15: 4 mg via INTRAVENOUS

## 2013-12-15 MED ORDER — ONDANSETRON HCL 4 MG/2ML IJ SOLN
4.0000 mg | INTRAMUSCULAR | Status: DC | PRN
  Administered 2014-01-02 – 2014-01-03 (×2): 4 mg via INTRAVENOUS
  Filled 2013-12-15 (×2): qty 2

## 2013-12-15 MED ORDER — POLYETHYLENE GLYCOL 3350 17 G PO PACK
17.0000 g | PACK | Freq: Every day | ORAL | Status: DC | PRN
  Filled 2013-12-15: qty 1

## 2013-12-15 MED ORDER — ACETAMINOPHEN 325 MG PO TABS: 650.0000 mg | ORAL_TABLET | ORAL | Status: DC | PRN

## 2013-12-15 MED ORDER — SODIUM CHLORIDE 0.9 % IV SOLN
250.0000 mL | INTRAVENOUS | Status: DC
  Administered 2013-12-15: 250 mL via INTRAVENOUS

## 2013-12-15 MED ORDER — SENNA 8.6 MG PO TABS
1.0000 | ORAL_TABLET | Freq: Two times a day (BID) | ORAL | Status: DC
  Administered 2013-12-15 – 2014-01-19 (×59): 8.6 mg via ORAL
  Filled 2013-12-15 (×70): qty 1

## 2013-12-15 MED ORDER — SODIUM CHLORIDE 0.9 % IV BOLUS (SEPSIS)
1000.0000 mL | Freq: Once | INTRAVENOUS | Status: AC
  Administered 2013-12-15: 1000 mL via INTRAVENOUS

## 2013-12-15 MED ORDER — MENTHOL 3 MG MT LOZG: 1.0000 | LOZENGE | OROMUCOSAL | Status: DC | PRN

## 2013-12-15 MED ORDER — ONDANSETRON HCL 4 MG/2ML IJ SOLN
INTRAMUSCULAR | Status: AC
  Filled 2013-12-15: qty 2

## 2013-12-15 MED ORDER — PHENOL 1.4 % MT LIQD: 1.0000 | OROMUCOSAL | Status: DC | PRN

## 2013-12-15 MED ORDER — OXYCODONE HCL 5 MG/5ML PO SOLN: 5.0000 mg | Freq: Once | ORAL | Status: DC | PRN

## 2013-12-15 MED ORDER — ACETAMINOPHEN 650 MG RE SUPP
650.0000 mg | RECTAL | Status: DC | PRN
  Filled 2013-12-15: qty 1

## 2013-12-15 MED ORDER — POTASSIUM CHLORIDE IN NACL 20-0.9 MEQ/L-% IV SOLN
INTRAVENOUS | Status: DC
  Administered 2013-12-15 (×2): via INTRAVENOUS
  Administered 2013-12-16: 1000 mL via INTRAVENOUS
  Administered 2013-12-17 – 2013-12-18 (×3): via INTRAVENOUS
  Filled 2013-12-15 (×10): qty 1000

## 2013-12-15 MED ORDER — ONDANSETRON HCL 4 MG/2ML IJ SOLN
INTRAMUSCULAR | Status: DC | PRN
  Administered 2013-12-15: 4 mg via INTRAVENOUS

## 2013-12-15 MED ORDER — ACETAMINOPHEN 650 MG RE SUPP: 650.0000 mg | RECTAL | Status: DC | PRN

## 2013-12-15 MED ORDER — PROMETHAZINE HCL 25 MG/ML IJ SOLN
INTRAMUSCULAR | Status: AC
  Filled 2013-12-15: qty 1

## 2013-12-15 MED ORDER — SODIUM CHLORIDE 0.9 % IJ SOLN
3.0000 mL | Freq: Two times a day (BID) | INTRAMUSCULAR | Status: DC
  Administered 2013-12-15 – 2013-12-27 (×14): 3 mL via INTRAVENOUS

## 2013-12-15 MED ORDER — DIAZEPAM 5 MG PO TABS
5.0000 mg | ORAL_TABLET | Freq: Four times a day (QID) | ORAL | Status: DC | PRN
  Administered 2013-12-18 – 2014-01-08 (×31): 5 mg via ORAL
  Filled 2013-12-15 (×32): qty 1

## 2013-12-15 MED ORDER — OXYCODONE HCL 5 MG PO TABS
5.0000 mg | ORAL_TABLET | Freq: Once | ORAL | Status: DC | PRN
Start: 2013-12-15 — End: 2013-12-15

## 2013-12-15 MED ORDER — CETYLPYRIDINIUM CHLORIDE 0.05 % MT LIQD
7.0000 mL | Freq: Two times a day (BID) | OROMUCOSAL | Status: DC
  Administered 2013-12-15 – 2014-01-09 (×36): 7 mL via OROMUCOSAL

## 2013-12-15 MED ORDER — OXYCODONE-ACETAMINOPHEN 5-325 MG PO TABS: 1.0000 | ORAL_TABLET | ORAL | Status: DC | PRN

## 2013-12-15 MED ORDER — PROMETHAZINE HCL 25 MG/ML IJ SOLN
6.2500 mg | INTRAMUSCULAR | Status: AC | PRN
  Administered 2013-12-15 (×2): 6.25 mg via INTRAVENOUS

## 2013-12-15 NOTE — Clinical Social Work Psychosocial (Signed)
Clinical Social Work Department BRIEF PSYCHOSOCIAL ASSESSMENT 12/15/2013  Patient:  Sarah [name], Sarah [name]     Account Number:  [id number]     Admit date:  12/12/2013  Clinical Social Worker:  Lovey Newcomer  Date/Time:  12/13/2013 12:37 PM  Referred by:  Physician  Date Referred:  12/13/2013 Referred for  SNF Placement   Other Referral:   Interview type:  Patient Other interview type:   Patient and mother interviewed at bedside.    PSYCHOSOCIAL DATA Living Status:  FAMILY Admitted from facility:   Level of care:   Primary support name:  Joaquim Lai Primary support relationship to patient:  PARENT Degree of support available:   Support is good.    CURRENT CONCERNS Current Concerns  Post-Acute Placement   Other Concerns:   NA    SOCIAL WORK ASSESSMENT / PLAN CSW met with patient and mother at bedside to complete assessment. Patient lives with sister and mother, but both work during the day and patient says she cannot be alone at home. CSW explained SNF search/placement process and answered questions. Patient states that she was discharged from [facility] a few days ago and could not walk when she arrived home. Patient appears calm and engaged in assessment. [name] in agreement with [facility] SNF placement.   Assessment/plan status:  Psychosocial Support/Ongoing Assessment of Needs Other assessment/ plan:   Complete Fl2, Fax, PASRR   Information/referral to community resources:   CSW contact information and SNf list given.    PATIENT'S/FAMILY'S RESPONSE TO PLAN OF CARE: Patient states that she is agreeable to SNF placement at discharge. CSW will assist.       Liz Beach MSW, [place], [place], [phone]

## 2013-12-15 NOTE — Progress Notes (Signed)
FMTS Attending Note Patient seen and examined, she is awake and alert, reports good control of pain.  She is able to sense light touch in feet.  Dorsi/plantar flexion in left foot (4/5) greater than right (2/5).  Patient's mother and maternal aunt at bedside. We reviewed family history for malignancy.  Maternal aunt (at bedside) with breast cancer; paternal grandmother with cancer.  No family history of thyroid cancer.  Plan to await path report re biopsy.  Patient mildly tachycardic (100-110) now. Denies chest pain or dyspnea. 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  Agree with IVF bolus for now, close observation.  Caprice Red

## 2013-12-15 NOTE — Progress Notes (Signed)
Patient ID: Sarah [name], female   DOB: 1965-07-01, 48 y.o.   MRN: [id number] BP 117/61  Pulse 83  Temp(Src) 97.7 F (36.5 C) (Oral)  Resp 17  Ht 5\' 11"  (1.803 m)  Wt 142.4 kg (313 lb 15 oz)  BMI 43.80 kg/m2  SpO2 98%  LMP 11/28/2013 Alert and oriented x 4, speech is clear and fluent Moving upper extremities Wound is clean, dry No change in lower extremities Awaiting pathology. Do believe will need another procedure for stabilization.  I do believe thyroid needs to be evaluated. Mass was very vascular. Will need a bowel protocol, as she cannot go on her own.

## 2013-12-15 NOTE — Progress Notes (Signed)
[facility] Progress Note Patient Name: Sarah [name] DOB: Sep 08, 1965 MRN: [id number]   Date of Service  12/15/2013  HPI/Events of Note  70 F s/p laminectomy with depression of spinal compression from a Thoracic lytic lesion.  [name] alert, HD stable.  eICU Interventions  Chart reviewed No current need for intervention Continue Elink monitoring     Intervention Category Evaluation Type: New Patient Evaluation  Sharelle Burditt 12/15/2013, 3:49 AM

## 2013-12-15 NOTE — Progress Notes (Signed)
Family Medicine Teaching Service Daily Progress Note Intern Pager: [phone]  Patient name: Sarah [name] Medical record number: [id number] Date of birth: 01-26-1966 Age: 48 y.o. Gender: female  Primary Care Provider: No PCP Per Patient Consultants: None Code Status: Full  Pt Overview and Major Events to Date:  - 9/22: Pt. Admitted with lower extremity weakness.  - 9/23: Pt. Found to have mass at T-3 level with spinal compression.  - 9/24: Surgery performed for decompression and biopsy overnight. Pt. Post-op and doing well. 3cm Suprahilar mass on CT Chest, PE also identified. ?Thyroid etiology, but much less likely for mets. Oncology consulted.   Assessment and Plan: Sarah [name] is a 48 y.o. female presenting with continued lower extremity weakness at baseline after discharge from hospital on 9/21. PMH is significant for obesity, HTN, and ?History of Hypothyroidism previously on Synthroid per patient.   #Acute on chronic LBP with inability to stand- At this point patient MRI and evaluation have revealed mass compression at level T-3 of spine. Considering Mets vs. Primary tumor, but pulmonary lesion noted on CT as well as Large thyroid mass ? Goiter. Family history and social history on further evaluation reveal little in the way of risk factors. Continuing to work up, and will consult Oncology. Surgical path to return on 9/25 per pathology.  - Admit to obs under Dr Nori Riis  - PT recommendations SNF with 24hour supervision. 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 Subsequently found to have lesion compressing t-spine in addition to vertebral fracture.  - Neurosurgery took her to the OR on 9/23, and biopsied as well as performed laminectomy for decompression.  - Decadron 18m q6hr.  - Subsequent CT chest, abdomen, pelvis with 3cm R Suprahilar pulmonary nodule and multiple smaller nodules.  - Large thyroid goiter noted. TSH normal, but likely needs ultrasound, ?  Biopsy. Won't be able to get uptake scan due to recent contrast load due to MRI.  - Waiting for surgical pathology in order to direct further workup and discussion regarding this patient.  - Consult to neurosurgery  - flexeril, oxycodone for back pain.  - foley catheter in place - bed rest - TSH, B12 Level, Mg, Phos - all relatively normal  - Random cortisol - Nl.  - ESR elevated, CRP elevted nonspecific but now concerning given new lesion on spine, and potential for mets.  - Overall this indicates a likely very poor prognosis.Most likely mets given presentation, and most common location for primary Breast, Lung, Kidney, Bowel, Thyroid. Pathology pending, and will pursue further workup once we have further information, and pending discussion with oncology and neurosurgery.   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 PCOS vs. Adrenal pathology?.[name] KitchenWould be unlike to be first presentation of HPA pathology; no evidence of salt wasting on labs. Hypertensive and not hypotensive. Testing during this hospitalization after having recently received steroids would be largely non-diagnostic. Though, could also be a combination of isolated processes i.e. Clitoromegaly, Hirsutism 2/2 obesity. Will need ongoing outpatient evaluation.  - Recent thought of outpt vs inpt w/up [place] ovaries vs 21-hydroxylase/ACTH/cortisol.  - TSH, Cortisol WNL - A1C : 6.7 - consistent with PCOS given evidence of cystic structures in pelvis.   #HTN- BP largely uncontrolled 150s-180s/70s-90s; previously on BP med that made her feel badly? Unclear what kind  -started with losartan 12.55mat last admission.  -  continue losartan here.  -vitals per floor protocol  - Blood pressure well controlled here.    #Obesity- believe this is largely contributing to ambulation issues  -needs aggressive dietary and exercise intervention  - consistent with  potential PCOS.   FEN/GI: Reg diet; KVO; no stool in 4-5 days with large stool burden and moderate abdominal distension on exam  - miralax daily  Prophylaxis: SCD's for now.  Disposition: Neurosurgery and Oncology on board. Will continue work up as indicated.   Subjective:  S/p surgery overnight. No complaints this am. She states that she feels "ok". She denies pain at this time.   Objective: Temp:  [97.7 F (36.5 C)-98.5 F (36.9 C)] 98.2 F (36.8 C) (09/24 1152) Pulse Rate:  [83-109] 98 (09/24 1300) Resp:  [13-27] 16 (09/24 1300) BP: (105-151)/(53-91) 122/72 mmHg (09/24 1300) SpO2:  [95 %-100 %] 95 % (09/24 1300) Weight:  [313 lb 15 oz (142.4 kg)] 313 lb 15 oz (142.4 kg) (09/24 0300) Physical Exam: General: NAD, AAOx3, morbidly obese  HEENT: Thyromegaly / Goiter noted on exam without tenderness, and no nodules, no LAD, neck supple  Abdomen: Soft and nontender, moderately distended, No organomegaly though exam limited by body habitus, +BS Extremities: 2+ posterior tibial pulses bilaterally, 2+ radial pulses bilaterally, no gross abnormalities noted. Somewhat cool bilateral feet.  Skin: No lesions or rashes noted  Neuro: CNII-XII intact, Normal speech, Motor: 2-/5 motor weakness in bilateral lower extremities grossly unchanged from admission, 5/5 motor BL upper extremities, full sensation to light touch, no sensory deficits, pt. Continues to be unable to ambulate. Babinski's negative.    Laboratory:  Recent Labs Lab 12/12/13 1310 12/13/13 1055 12/15/13 1205  WBC 8.9 7.8 9.3  HGB 11.6* 11.6* 10.6*  HCT 34.4* 34.8* 31.2*  PLT 306 303 244    Recent Labs Lab 12/12/13 1310 12/13/13 1055 12/15/13 0314  NA 139 138 136*  K 3.9 3.7 4.5  CL 96 96 97  CO2 30 29 24   BUN 14 14 18   CREATININE 0.85 0.80 1.08  CALCIUM 9.1 8.8 8.5  PROT  --   --  7.0  BILITOT  --   --  1.4*  ALKPHOS  --   --  118*  ALT  --   --  19  AST  --   --  30  GLUCOSE 113* 121* 216*   Urinalysis     Component Value Date/Time   COLORURINE YELLOW 12/10/2013 1036   APPEARANCEUR CLOUDY* 12/10/2013 1036   LABSPEC 1.022 12/10/2013 1036   PHURINE 5.5 12/10/2013 1036   GLUCOSEU NEGATIVE 12/10/2013 1036   HGBUR NEGATIVE 12/10/2013 1036   BILIRUBINUR NEGATIVE 12/10/2013 1036   KETONESUR NEGATIVE 12/10/2013 1036   PROTEINUR NEGATIVE 12/10/2013 1036   UROBILINOGEN 1.0 12/10/2013 1036   NITRITE NEGATIVE 12/10/2013 1036   LEUKOCYTESUR NEGATIVE 12/10/2013 1036    Phos - 3.9 Mg - 2.2 CRP - 11.6 ESR - 59 CK - 56 B12 - 209 Random Cortisol - 25.4 A1C - 6.7 TSH - 2.13 RPR - nonreactive HIV - nonreactive UPT - Negative  Imaging/Diagnostic Tests:  CT Chest Abd/ Pelvis 9/23:  CLINICAL DATA: Destructive metastatic lesion of the T3 vertebral  body with no known primary carcinoma.  EXAM:  CT CHEST, ABDOMEN, AND PELVIS WITH CONTRAST  TECHNIQUE:  Multidetector CT imaging of the chest, abdomen and pelvis was  performed following the standard protocol during bolus  administration of intravenous contrast.  CONTRAST: 163m OMNIPAQUE IOHEXOL 300 MG/ML SOLN  COMPARISON: MRI  of the cervical spine earlier today.  FINDINGS:  CT CHEST FINDINGS  Within the chest, a right suprahilar mass measures approximately 3.0  x 2.7 x 2.2 cm. This either represents a nodal metastasis or central  lung mass such as small cell carcinoma. A number of smaller  mediastinal lymph nodes are also identified in the right  paratracheal region, prevascular space and AP window.  Destructive lesion of the T3 vertebral body noted which nearly  replaces the entire vertebral body with tumor extension posteriorly  into the epidural space causing significant compression of the  spinal cord. Paravertebral extension of tumor is also suspected  lateral and anterior to the vertebral body, right greater than left.  Massive enlargement of the thyroid gland present with transverse  dimensions of approximately 7.7 x 8.5 cm. The thyroid gland is   incompletely visualized as the neck was not included. Central  dystrophic calcification identified. The enlarged thyroid gland does  cause significant mass effect on the airway with evidence of  tracheal deviation to the right and tracheal narrowing at the  thoracic inlet. The enlarged thyroid gland extends into the superior  mediastinum.  There is evidence of pulmonary embolism with nonocclusive thrombus  identified in the right middle lobe and lower lobe pulmonary  arteries. Pulmonary arterial opacification is not optimal on the  standard venous phase evaluation of the chest but the right sided  pulmonary embolism is clearly present.  There are small bilateral pulmonary nodules. Right-sided pulmonary  nodule measuring 6 mm likely localizes to the very top of the lower  lobe near the major fissure. Left upper lobe nodule measures 7 mm.  Lingular nodule measures 7 mm. Left lower lobe nodule measures 7 mm.  Faint peripheral right middle lobe nodule measures 3 mm. Inferior  right middle lobe nodule measures 6 mm. Several small nodules in the  posterior right upper lobe identified in the 3-4 mm range.  No evidence of pleural or pericardial fluid. No other bony lesions  identified in the thorax.  CT ABDOMEN AND PELVIS FINDINGS  Small low-density lesion in the left lobe of the liver measures 7 mm  and likely represents a cyst. No other hepatic abnormalities. The  gallbladder, pancreas, spleen, adrenal glands and kidneys are  unremarkable. No adenopathy is identified in the abdomen or pelvis.  Bowel shows normal caliber without evidence of obstruction, lesion  or inflammation. No abnormal fluid collections.  The bladder is moderately distended. Elongated cystic changes in  both adnexal regions identified which may represent a combination of  ovarian cysts and potentially hydrosalpinx. Correlation with pelvic  ultrasound is recommended. No hernias are identified. The uterus  appears  unremarkable by CT. No bony lesions are identified in the  abdomen or pelvis.  IMPRESSION:  1. 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  2. 3 cm right suprahilar mass may represent nodal metastasis versus  primary central tumor. Next number multiple small bilateral  pulmonary nodules consistent with metastatic disease.  3. Acute nonocclusive pulmonary embolism in the right middle lobe  and right lower lobe pulmonary arteries.  4. Massive thyroid goiter causing tracheal deviation to the right  and narrowing of the trachea.  5. Tubular cystic abnormalities of both adnexal regions which may be  a combination of cystic changes in the ovaries and potentially  hydrosalpinx. Correlation recommended with pelvic ultrasound to  exclude any suspicious  cystic lesions of the adnexae.  6. Critical Value/emergent results were called by telephone at the  time of interpretation on 12/14/2013 at 4:52 pm to Dr. Tamala Julian of the  [facility] Medicine Teaching Service, who verbally acknowledged these  results.  Electronically Signed  By: Aletta Edouard M.D.  On: 12/14/2013 16:53   MRI T-spine 9/23:  CLINICAL DATA: Lower extremity weakness. Worsening back pain.  Compressive upper thoracic mass partially visualized on cervical  spine MRI as well as chest CT with spinal cord compression.  EXAM:  MRI THORACIC SPINE WITHOUT CONTRAST  TECHNIQUE:  Multiplanar, multisequence MR imaging of the thoracic spine was  performed. No intravenous contrast was administered.  COMPARISON: Cervical spine MRI and chest CT earlier today  FINDINGS:  There is a T3 compression fracture with approximately 50% height  loss centrally. As described on cervical spine MRI and chest CT  earlier today, there is abnormal soft tissue replacing the majority  of the T3 vertebral body extending into the pedicles as  well as into  the ventral epidural space with moderate cord compression. Epidural  extension is slightly greater on the right and extends into the  inferior aspect of the right T2-3 neural foramen as well as superior  aspect of the right T3-4 neural foramen. There is also abnormal  paravertebral soft tissue laterally and anteriorly as seen on CT.  Central ventral epidural material posterior to the T4 vertebral body  may represent tumor or hematoma. Mild T2 hyperintensity is present  in the spinal cord at the T3 level extending inferiorly to the T4-5  disc space level consistent with mild edema. Mild edema is present  in the posterior paraspinal soft tissues of the upper thoracic  spine. No other vertebral body lesions are identified.  There is a moderate-sized central disc extrusion at T7-8 which  contacts and mildly flattens the ventral spinal cord with mild  spinal stenosis. A small left central disc protrusion is present at  T8-9 with at most minimal impression on the spinal cord. Massive  enlargement of the left thyroid lobe is partially visualized.  Mediastinal and lung findings are better demonstrated on recent  chest CT.  IMPRESSION:  1. T3 vertebral body mass with pathologic compression fracture and  epidural tumor resulting in moderate cord compression with evidence  of mild spinal cord edema.  2. T7-8 disc extrusion resulting in mild spinal stenosis.   MRI Head / C-Spine 9/23:  CLINICAL DATA: 48 year old female who presented with back pain and  lower extremity numbness, with continued inadequately explained  lower extremity weakness following Hospital discharge on 12/12/2013.  Initial encounter.  EXAM:  MRI HEAD WITHOUT AND WITH CONTRAST  MRI CERVICAL SPINE WITHOUT AND WITH CONTRAST  TECHNIQUE:  Multiplanar, multiecho pulse sequences of the brain and surrounding  structures, and cervical spine, to include the craniocervical  junction and cervicothoracic junction, were  obtained without and  with intravenous contrast.  CONTRAST: 20 mL MultiHance.  COMPARISON: Lumbar MRI 12/10/2013.  FINDINGS:  MRI HEAD FINDINGS  Cerebral volume is normal. No restricted diffusion to suggest acute  infarction. No midline shift, mass effect, evidence of mass lesion,  ventriculomegaly, extra-axial collection or acute intracranial  hemorrhage. Cervicomedullary junction and pituitary are within  normal limits. Major intracranial vascular flow voids are preserved.  Pearline Cables and white matter signal is within normal limits for age  throughout the brain. No abnormal enhancement identified, pulsation  artifact prominent on axial post-contrast images.  Large body habitus. Small right frontal scalp lipoma (  series 10,  image 37). Visible internal auditory structures appear normal. Mild  right mastoid fluid. Minor paranasal sinus mucosal thickening.  Visualized orbit soft tissues are within normal limits. Partially  visible thyromegaly greater on the left, see below. Normal bone  marrow signal.  MRI CERVICAL SPINE FINDINGS  Large body habitus resulting in wrap artifact on axial images.  However, there is massive thyroid enlargement evident, with the left  lobe measuring at least 7.2 x 5.2 x 11.2 cm (AP by transverse by  CC). The trachea is displaced to the right at the thoracic inlet and  mildly compressed. There is other regional mass effect including  sheath. On the left carotid  Fairly preserved cervical lordosis. No marrow edema or evidence of  acute osseous abnormality. Cervicomedullary junction is within  normal limits. Spinal cord signal is within normal limits at all  visualized levels. No abnormal intradural enhancement.  C2-C3: Negative.  C3-C4: Disc osteophyte complex, facet and ligament flavum  hypertrophy. Along with a degree of congenital canal narrowing,  there is mild spinal stenosis and moderate biforaminal stenosis.  C4-C5: Mild uncovertebral and facet hypertrophy.  Mild right C5  foraminal stenosis.  C5-C6: Circumferential disc osteophyte complex. Along with a degree  of congenital canal narrowing there is mild spinal stenosis.  Moderate right C6 foraminal stenosis.  C6-C7: Negative except for mild facet hypertrophy.  C7-T1: Negative.  T1-T2: Negative.  Faintly visualized at the T3 level in the upper thoracic spine there  is a lobulated solidly enhancing mass which appears to extend into  the epidural space from the T3 vertebral body. This is inseparable  from the spinal cord which is likely compressed. See series 2000  image 7 and series 1500, image 6. This is not included on axial  images.  IMPRESSION:  1. Faintly visible but aggressive appearing enhancing mass at the T3  level of the upper thoracic spine, likely with spinal cord  compression evaluate further are with thoracic MRI without and with  contrast.  2. Normal for age MRI appearance of the brain.  3. Massive thyroid enlargement, greater on the left. Left thyroid  goiter/mass measuring at least 7.2 x 5.2 x 11.2 cm. Mass effect at  the thoracic inlet including on the trachea.  4. Mild chronic disc and endplate degeneration in the cervical spine  at C3-C4 and C5-C6 resulting in borderline to mild spinal stenosis,  but no cervical spinal cord mass effect or signal abnormality.  Electronically Signed:  By: Lars Pinks M.D.  On: 12/14/2013 09:50   MRI L-Spine 9/19:  CLINICAL DATA: Back pain with bilateral leg numbness, worsening  with oral steroids.  EXAM:  MRI LUMBAR SPINE WITHOUT CONTRAST  TECHNIQUE:  Multiplanar, multisequence MR imaging of the lumbar spine was  performed. No intravenous contrast was administered.  COMPARISON: CT 12/09/2013  FINDINGS:  There is no degenerative disease at L2-3 or above. The discs are  normal in signal characteristics and morphology. The spinal canal  and foramina are widely patent. The distal cord and conus are normal  with conus tip at L1. There  are small benign and insignificant  hemangiomas within the L2 and L3 vertebral bodies.  L3-4: Minimal desiccation of the disc. Mild circumferential bulging  slightly more prominent towards the left. Bilateral facet  degeneration and hypertrophy. Mild narrowing of the lateral recesses  without apparent neural compression. Mild foraminal narrowing on the  left without apparent neural compression.  L4-5: Desiccation of the disc with circumferential protrusion.  Bilateral  facet and ligamentous hypertrophy. Stenosis of both  lateral recesses without definite focal neural compression. Mild  foraminal narrowing bilaterally.  L5-S1: Desiccation of the disc with loss of height. Endplate  osteophytes and bulging of the disc more prominent towards the left.  Bilateral facet degeneration and hypertrophy left more than right.  Stenosis of the subarticular lateral recess on the left that could  compress the S1 nerve root. Mild foraminal narrowing left more than  right but without apparent compression of the exiting L5 nerve  roots.  Serpiginous fluid-filled structure in the left pelvis could  represent hydrosalpinx. This is not primarily or completely imaged.  IMPRESSION:  Degenerative disc disease and degenerative facet disease in the  lower lumbar spine that could be a cause of lumbago.  Lateral recess and foraminal narrowing at L3-4, L4-5 and L5-S1 as  described above that could be a cause of focal neural compression.  In general, this is more pronounced on the left than on the right.  In particular, there is potential for left S1 nerve root compression  in the subarticular lateral recess at L5-S1.  Electronically Signed  By: Nelson Chimes M.D.  On: 12/10/2013 08:45   CT L-Spine 9/18:  CLINICAL DATA: Low back pain with difficulty walking.  EXAM:  CT LUMBAR SPINE WITHOUT CONTRAST  TECHNIQUE:  Multidetector CT imaging of the lumbar spine was performed without  intravenous contrast  administration. Multiplanar CT image  reconstructions were also generated.  COMPARISON: None.  FINDINGS:  There is no fracture or spondylolisthesis. There is moderately  severe disc space narrowing at L5-S1 with slight vacuum phenomenon  at this level. There is moderate narrowing at L4-5. There are  prominent anterior osteophytes at L2, L3, L4, and L5.  At T12-L1, there is moderate facet hypertrophy bilaterally. There is  no nerve root edema or effacement. No disc extrusion or stenosis.  At L1-2, there is moderate facet hypertrophy bilaterally. There is  mild diffuse disc bulging. There is no nerve root edema or  effacement. No disc extrusion or stenosis.  At L2-3, there is broad-based disc bulging and moderate facet  hypertrophy bilaterally. There is borderline narrowing of the thecal  sac with borderline spinal stenosis. No disc extrusion.  At L3-4, there is severe facet osteoarthritic change bilaterally,  more pronounced on the left than on the right. There is broad-based  disc bulging and moderate ligamentum flavum hypertrophy. These  findings in concert lead to moderate generalized spinal stenosis.  At L4-5, there is broad-based disc protrusion and severe facet  osteoarthritic change bilaterally. These findings in concert lead to  moderate generalized spinal stenosis. No frank disc extrusion.  At L5-S1, there is severe facet osteoarthritic change on the left  and moderate facet osteoarthritic change on the right. There is  broad-based disc protrusion. There is exit foraminal narrowing  bilaterally, more severe on the left than on the right, due to  osteophytic change. No frank disc extrusion or stenosis is seen at  this level.  There are no paraspinous lesions.  IMPRESSION:  Spinal stenosis at L3-4 and L4-5, multifactorial. Borderline spinal  stenosis at L2-3. There does appear to be a degree of congenital  narrowing of the canal which contributes to spinal stenosis.   Multilevel arthropathy.  No frank disc extrusion.  No fracture or spondylolisthesis.  Electronically Signed  By: Lowella Grip M.D.  On: 12/09/2013 13:50   CXR 9/13:  CLINICAL DATA: Upper back pain for 5 weeks. Dyspnea on exertion.  Nonsmoker. History of  hypertension.  EXAM:  CHEST 2 VIEW  COMPARISON: 04/15/2006  FINDINGS:  The heart is mildly enlarged. There is elevation of right  hemidiaphragm, stable in appearance. There is streaky density  favored to be within the right lower lobe, consistent with  atelectasis or infiltrate. No pulmonary edema. No pleural effusions.  There are moderate degenerative changes in the thoracic spine.  IMPRESSION:  Right lower lobe atelectasis or infiltrate.  Electronically Signed  By: Shon Hale M.D.  On: 12/04/2013 19:26   Aquilla Hacker, MD 12/15/2013, 2:38 PM PGY-1, [facility] Intern pager: [phone], text pages welcome

## 2013-12-15 NOTE — Plan of Care (Signed)
Problem: Phase I Progression Outcomes Goal: Sutures/staples intact Outcome: Completed/Met Date Met:  12/15/13 Skin glue

## 2013-12-15 NOTE — Progress Notes (Signed)
FMTS Attending Note Patient seen in [facility] unit, discussed with resident team and I agree with Dr Melancon's assessment and plan.  Patient s/p laminectomy, tissue biopsy performed.  CT chest/abd/pelvis with R suprahilar mass and associated adenopathy, unclear if this represents mets versus primary lung mass.  Also identified to have PE on CT. Plan to defer anticoagulation until 48 hours post-operatively.  Neurosurgery note indicates possibility of return to OR, will defer to their OR plans in order to begin anticoagulation as soon as possible.  On Decadron presently. Lower ext dopplers do not identify clot burden in legs.  Oncology consult, awaiting path report on tissue biopsy. Dalbert Mayotte, MD

## 2013-12-15 NOTE — Clinical Social Work Placement (Signed)
Clinical Social Work Department CLINICAL SOCIAL WORK PLACEMENT NOTE 12/15/2013  Patient:  Sarah [name], Sarah [name]  Account Number:  [id number] [place] date:  12/12/2013  Clinical Social Worker:  Kemper Durie, [place]  Date/time:  12/14/2013 12:41 PM  Clinical Social Work is seeking post-discharge placement for this patient at the following level of care:   SKILLED NURSING   (*CSW will update this form in Epic as items are completed)   12/14/2013  Patient/family provided with [place] Department of Clinical Social Work's list of facilities offering this level of care within the geographic area requested by the patient (or if unable, by the patient's family).  12/14/2013  Patient/family informed of their freedom to choose among providers that offer the needed level of care, that participate in Medicare, Medicaid or managed care program needed by the patient, have an available bed and are willing to accept the patient.  12/14/2013  Patient/family informed of [facility]' ownership interest in [facility], as well as of the fact that they are under no obligation to receive care at this facility.  PASARR submitted to EDS on 12/14/2013 PASARR number received on 12/14/2013  FL2 transmitted to all facilities in geographic area requested by pt/family on  12/14/2013 FL2 transmitted to all facilities within larger geographic area on 12/14/2013  Patient informed that his/her managed care company has contracts with or will negotiate with  certain facilities, including the following:     Patient/family informed of bed offers received:  12/14/2013 Patient chooses bed at  Physician recommends and patient chooses bed at    Patient to be transferred to  on   Patient to be transferred to facility by  Patient and family notified of transfer on  Name of family member notified:    The following physician request were entered in Epic: Physician Request  Please sign FL2.    Additional  Comments: Patient and mother are leaning more towards placement at [facility]. Patient also has offers with [facility].    Liz Beach MSW, [place], [place], [phone]

## 2013-12-15 NOTE — Progress Notes (Signed)
VASCULAR LAB PRELIMINARY  PRELIMINARY  PRELIMINARY  PRELIMINARY  Bilateral lower extremity venous duplex  completed.    Preliminary report:  Bilateral:  No evidence of DVT, superficial thrombosis, or Baker's Cyst.    Neftali Thurow, RVT 12/15/2013, 1:42 PM

## 2013-12-15 NOTE — Op Note (Signed)
12/12/2013 - 12/15/2013  1:30 AM  PATIENT:  Sarah [name]  48 y.o. female  PRE-OPERATIVE DIAGNOSIS:  Thoracic  Three Tumor  POST-OPERATIVE DIAGNOSIS:  Thoracic Three Tumor  PROCEDURE:  Procedure(s): THORACIC LAMINECTOMY FOR TUMOR THORACIC THREE, decompression  SURGEON:  Surgeon(s): Ashok Pall, MD  ASSISTANTS:none  ANESTHESIA:   general  EBL:  Total I/O In: 1000 [I.V.:1000] Out: 200 [Blood:200]  BLOOD ADMINISTERED:none  CELL SAVER GIVEN:none  COUNT:per nursing  DRAINS: none   SPECIMEN:  Source of Specimen:  epidural space thoracic spine  DICTATION: [name] is a 48 y.o. female Whom was taken to the operating room intubated, and placed under a general anesthetic without difficulty. She was positioned prone on the [name] with all pressure points padded. Her back was cleaned then prepped in a sterile manner. I used flouroscopy to localize the T3 body, and lamina. I infiltrated lidocaine into the planned incision. I opened the incision with a 10 blade, then exposed the lamina of t2, and T3. I used fluoroscopy to again localize T3. I used the drill and the [place] rongeur to complete the laminectomy.I appreciated a reddish, firm mass in the epidural space with a distinct capsule. I brought the microscope into the operative field to help me confirm the abnormality. The mass was quite vascular, and since by doing the laminectomy I had already decompressed the spinal cord, I took only a very small piece of tissue for pathology/ Caudal to this area the epidural fat was normal. I achieved hemostasis then closed the wound. The canal was decompressed at T3. I closed the wound in layers approximating the thoracolumbar fascia, subcutaneous  And subucuticular planes with vicryl sutures. I applied Dermabond for a sterile dressing.   PLAN OF CARE: Admit to inpatient   PATIENT DISPOSITION:  PACU - hemodynamically stable.   Delay start of Pharmacological VTE agent (>24hrs) due to  surgical blood loss or risk of bleeding:  yes

## 2013-12-15 NOTE — Progress Notes (Signed)
Inpatient Diabetes Program Recommendations  AACE/ADA: New Consensus Statement on Inpatient Glycemic Control (2013)  Target Ranges:  Prepandial:   less than 140 mg/dL      Peak postprandial:   less than 180 mg/dL (1-2 hours)      Critically ill patients:  140 - 180 mg/dL   Reason for Assessment: elevated CBG and A1C  Diabetes history: none Outpatient Diabetes medications: none Current orders for Inpatient glycemic control: none **Note that patient does not have a formal diagnosis of DM. A1c of 6.7%. Per ADA Standards of Care, A1c of 6.5% or greater is positive indicator of DM. Spoke with RN caring for Sarah [name]- internal medicine MD has recently been in to see patient. RN will follow up with MD.  Gentry Fitz, RN, BA, MHA, CDE Diabetes Coordinator Inpatient Diabetes Program  [phone] (Team Pager) [phone] [facility]) 12/15/2013 8:21 AM

## 2013-12-15 NOTE — Progress Notes (Signed)
COURTESY NOTE:  Consult received. Will await final path before meeting with patient and family--hopefully will be out tomorrow.

## 2013-12-15 NOTE — Progress Notes (Signed)
Pt reported return of some sensation in pelvic region

## 2013-12-15 NOTE — Transfer of Care (Signed)
Immediate Anesthesia Transfer of Care Note  Patient: Sarah [name]  Procedure(s) Performed: Procedure(s) with comments: THORACIC LAMINECTOMY FOR TUMOR THORACIC THREE (N/A) - THORACIC LAMINECTOMY FOR TUMOR THORACIC THREE  Patient Location: PACU  Anesthesia Type:General  Level of Consciousness: awake, alert  and oriented  Airway & Oxygen Therapy: Patient Spontanous Breathing and Patient connected to nasal cannula oxygen  Post-op Assessment: Report given to PACU RN and Post -op Vital signs reviewed and stable  Post vital signs: Reviewed and stable  Complications: No apparent anesthesia complications

## 2013-12-15 NOTE — Progress Notes (Addendum)
Pharmacy Consult - Heparin  S/p laminectomy with tissue biopsy 9/24 early AM Found to have a PE on CT  Plan is to defer anticoagulation for 48 hours per most recent note.  Plan: She is currently on sq heparin Plan to start full dose heparin 9/26 am if plan remains the same.  Will continue to follow.  Thank you. Anette Guarneri, PharmD [phone]

## 2013-12-16 ENCOUNTER — Encounter (HOSPITAL_COMMUNITY): Payer: Self-pay | Admitting: Neurosurgery

## 2013-12-16 DIAGNOSIS — M545 Low back pain, unspecified: Secondary | ICD-10-CM

## 2013-12-16 LAB — GLUCOSE, CAPILLARY
GLUCOSE-CAPILLARY: 203 mg/dL — AB (ref 70–99)
GLUCOSE-CAPILLARY: 214 mg/dL — AB (ref 70–99)
Glucose-Capillary: 161 mg/dL — ABNORMAL HIGH (ref 70–99)

## 2013-12-16 LAB — CBC WITH DIFFERENTIAL/PLATELET
Basophils Absolute: 0 10*3/uL (ref 0.0–0.1)
Basophils Relative: 0 % (ref 0–1)
EOS PCT: 0 % (ref 0–5)
Eosinophils Absolute: 0 10*3/uL (ref 0.0–0.7)
HCT: 31.1 % — ABNORMAL LOW (ref 36.0–46.0)
Hemoglobin: 10.4 g/dL — ABNORMAL LOW (ref 12.0–15.0)
LYMPHS ABS: 1.1 10*3/uL (ref 0.7–4.0)
LYMPHS PCT: 9 % — AB (ref 12–46)
MCH: 28.7 pg (ref 26.0–34.0)
MCHC: 33.4 g/dL (ref 30.0–36.0)
MCV: 85.7 fL (ref 78.0–100.0)
MONOS PCT: 4 % (ref 3–12)
Monocytes Absolute: 0.5 10*3/uL (ref 0.1–1.0)
Neutro Abs: 10.7 10*3/uL — ABNORMAL HIGH (ref 1.7–7.7)
Neutrophils Relative %: 87 % — ABNORMAL HIGH (ref 43–77)
PLATELETS: 239 10*3/uL (ref 150–400)
RBC: 3.63 MIL/uL — AB (ref 3.87–5.11)
RDW: 13.9 % (ref 11.5–15.5)
WBC: 12.3 10*3/uL — AB (ref 4.0–10.5)

## 2013-12-16 NOTE — Anesthesia Postprocedure Evaluation (Signed)
Anesthesia Post Note  Patient: Sarah [name]  Procedure(s) Performed: Procedure(s) (LRB): THORACIC LAMINECTOMY FOR TUMOR THORACIC THREE (N/A)  Anesthesia type: General  Patient location: PACU  Post pain: Pain level controlled and Adequate analgesia  Post assessment: Post-op Vital signs reviewed, Patient's Cardiovascular Status Stable, Respiratory Function Stable, Patent Airway and Pain level controlled  Last Vitals:  Filed Vitals:   12/16/13 0800  BP: 111/64  Pulse: 82  Temp: 36.6 C  Resp: 18    Post vital signs: Reviewed and stable  Level of consciousness: awake, alert  and oriented  Complications: No apparent anesthesia complications

## 2013-12-16 NOTE — Progress Notes (Signed)
Family Medicine Teaching Service Daily Progress Note Intern Pager: [phone]  Patient name: Sarah [name] Medical record number: [id number] Date of birth: [birth year] Age: 48 y.o. Gender: female  Primary Care Provider: No PCP Per Patient Consultants: None Code Status: Full  Pt Overview and Major Events to Date:  - 9/22: Pt. Admitted with lower extremity weakness.  - 9/23: Pt. Found to have mass at T-3 level with spinal compression.  - 9/24: Surgery performed for decompression and biopsy overnight. Pt. Post-op and doing well. 3cm Suprahilar mass on CT Chest, PE also identified. ?Thyroid etiology, but much less likely for mets. Oncology consulted.  - 9/25: Awaiting pathology results with plan for oncology to see once path returns. Pt. With some neurological improvement.   Assessment and Plan: TAHTIANA ROZIER is a 48 y.o. female presenting with continued lower extremity weakness at baseline after discharge from hospital on 9/21. PMH is significant for obesity, HTN, and ?History of Hypothyroidism previously on Synthroid per patient.   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 Considering Mets vs. Primary tumor. Pulmonary lesion noted on CT as well as Large thyroid mass ? Goiter. Family history and social history on further evaluation reveal little in the way of risk factors. Continuing to work up, and will consult Oncology. Surgical path to return on 9/25 per pathology.  - Admit to obs under Dr Nori Riis  - PT recommendations SNF with 24hour supervision. 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 Subsequently found to have lesion compressing t-spine in addition to vertebral fracture.  - Neurosurgery took her to the OR on 9/23, and biopsied as well as performed laminectomy for decompression.  - Decadron 48m q6hr.   - Subsequent CT chest, abdomen, pelvis with 3cm R Suprahilar pulmonary nodule and multiple smaller nodules.  - Large thyroid goiter noted. TSH normal, but likely needs ultrasound, ? Biopsy. Won't be able to get uptake scan due to recent contrast load due to MRI.  - Waiting for surgical pathology in order to direct further workup and discussion regarding this patient. Oncology to see once pathology returns.  - Consult to neurosurgery  - flexeril, oxycodone for back pain.  - foley catheter in place - bed rest  - TSH, B12 Level, Mg, Phos - all relatively normal  - Random cortisol - Nl.  - ESR elevated, CRP elevted nonspecific but now concerning given new lesion on spine, and potential for mets.  - Overall this indicates a likely very poor prognosis. Most likely mets given presentation, and most common location for primary in descending order Breast, Lung, Kidney, Bowel, Thyroid. Pathology pending, and will pursue further workup once we have more information, and pending discussion with oncology and neurosurgery.   #Pulmonary Embolus - Found on CT Chest in R middle and R lower lob pulmonary arteries. Pt. With brief episodes of tachycardia, but otherwise asymptomatic with O2 sats 100% on 2L [place]. Pt. Without chest pain at this time. Blood pressures stable.  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 ESR elevated.  - Will initiate Heparin 48hrs. Postoperatively per discussion with Neurosurgery.  - SCD's only at this time due to recent operation.  - Will continue to monitor VS. Stable at this time. Tachycardic yesterday, but resolved with 1L bolus.  - Lower extremity Venous Duplex negative for DVT.   #Hypervirilization -  with clitoromegaly noted on prior exam and buffalo hump and obesity as well as upper lip hair. PCOS vs. Adrenal pathology?[name] Kitchen Would be unlike to be first presentation of HPA pathology; no evidence of salt wasting on labs. Hypertensive and not hypotensive. Testing during this  hospitalization after having recently received steroids would be largely non-diagnostic. Though, could also be a combination of isolated processes i.e. Clitoromegaly, Hirsutism 2/2 obesity. Will need ongoing outpatient evaluation.  - Recent thought of outpt vs inpt w/up including [place] to assess ovaries vs 21-hydroxylase/ACTH/cortisol.  - TSH, Cortisol WNL - A1C : 6.7 - consistent with PCOS given evidence of cystic structures in pelvis.   #HTN- BP largely uncontrolled 150s-180s/70s-90s; previously on BP med that made her feel badly? Unclear what kind  -started with losartan 12.60m at last admission.  -continue losartan here.  -vitals per floor protocol  - Blood pressure well controlled here.    #Obesity- believe this is largely contributing to ambulation issues  -needs aggressive dietary and exercise intervention  - consistent with potential PCOS.   FEN/GI: Reg diet; KVO; no stool in 4-5 days with large stool burden and moderate abdominal distension on exam  - miralax daily  Prophylaxis: SCD's for now.  Disposition: Neurosurgery and Oncology on board. Will continue work up as indicated.   Subjective:  Pt. States that she is doing well this am. She reports improvement in lower extremity numbness, but reports no change in lower extremity weakness. She says that she feels well overall and continues to maintain a positive outlook.   Objective: Temp:  [97.7 F (36.5 C)-99.4 F (37.4 C)] 98.6 F (37 C) (09/24 2328) Pulse Rate:  [73-112] 73 (09/25 0700) Resp:  [16-20] 16 (09/25 0700) BP: (105-135)/(53-75) 112/66 mmHg (09/25 0700) SpO2:  [94 %-100 %] 99 % (09/25 0700) Physical Exam: General: NAD, AAOx3, morbidly obese  HEENT: Thyromegaly / Goiter noted on exam without tenderness, and no nodules, no LAD, neck supple  Abdomen: Soft and nontender, moderately distended, No organomegaly though exam limited by body habitus, +BS this am. Extremities: 2+ distal pulses bilaterally, no edema, no calf  tenderness.   Skin: No lesions or rashes noted  Neuro: CNII-XII intact, Normal speech, Motor: continues to remain the same with  2-/5 motor weakness in bilateral lower extremities grossly unchanged from admission, 5/5 motor BL upper extremities, full sensation to light touch, no sensory deficits, pt. Continues to be unable to ambulate. Pt. With spinal reflex with babinski's this am, spastic response.     Laboratory:  Recent Labs Lab 12/12/13 1310 12/13/13 1055 12/15/13 1205  WBC 8.9 7.8 9.3  HGB 11.6* 11.6* 10.6*  HCT 34.4* 34.8* 31.2*  PLT 306 303 244    Recent Labs Lab 12/12/13 1310 12/13/13 1055 12/15/13 0314  NA 139 138 136*  K 3.9 3.7 4.5  CL 96 96 97  CO2 30 29 24   BUN 14 14 18   CREATININE 0.85 0.80 1.08  CALCIUM 9.1 8.8 8.5  PROT  --   --  7.0  BILITOT  --   --  1.4*  ALKPHOS  --   --  118*  ALT  --   --  19  AST  --   --  30  GLUCOSE 113* 121* 216*   Urinalysis    Component Value Date/Time   COLORURINE YELLOW 12/10/2013 1036   APPEARANCEUR CLOUDY* 12/10/2013 1036   LABSPEC 1.022 12/10/2013 1036   PHURINE 5.5 12/10/2013 1036   GLUCOSEU NEGATIVE 12/10/2013 1036   HGBUR  NEGATIVE 12/10/2013 1036   BILIRUBINUR NEGATIVE 12/10/2013 1036   KETONESUR NEGATIVE 12/10/2013 1036   PROTEINUR NEGATIVE 12/10/2013 1036   UROBILINOGEN 1.0 12/10/2013 1036   NITRITE NEGATIVE 12/10/2013 1036   LEUKOCYTESUR NEGATIVE 12/10/2013 1036    Phos - 3.9 Mg - 2.2 CRP - 11.6 ESR - 59 CK - 56 B12 - 209 Random Cortisol - 25.4 A1C - 6.7 TSH - 2.13 RPR - nonreactive HIV - nonreactive UPT - Negative  Imaging/Diagnostic Tests:  CT Chest Abd/ Pelvis 9/23:  CLINICAL DATA: Destructive metastatic lesion of the T3 vertebral  body with no known primary carcinoma.  EXAM:  CT CHEST, ABDOMEN, AND PELVIS WITH CONTRAST  TECHNIQUE:  Multidetector CT imaging of the chest, abdomen and pelvis was  performed following the standard protocol during bolus  administration of intravenous contrast.   CONTRAST: 121m OMNIPAQUE IOHEXOL 300 MG/ML SOLN  COMPARISON: MRI of the cervical spine earlier today.  FINDINGS:  CT CHEST FINDINGS  Within the chest, a right suprahilar mass measures approximately 3.0  x 2.7 x 2.2 cm. This either represents a nodal metastasis or central  lung mass such as small cell carcinoma. A number of smaller  mediastinal lymph nodes are also identified in the right  paratracheal region, prevascular space and AP window.  Destructive lesion of the T3 vertebral body noted which nearly  replaces the entire vertebral body with tumor extension posteriorly  into the epidural space causing significant compression of the  spinal cord. Paravertebral extension of tumor is also suspected  lateral and anterior to the vertebral body, right greater than left.  Massive enlargement of the thyroid gland present with transverse  dimensions of approximately 7.7 x 8.5 cm. The thyroid gland is  incompletely visualized as the neck was not included. Central  dystrophic calcification identified. The enlarged thyroid gland does  cause significant mass effect on the airway with evidence of  tracheal deviation to the right and tracheal narrowing at the  thoracic inlet. The enlarged thyroid gland extends into the superior  mediastinum.  There is evidence of pulmonary embolism with nonocclusive thrombus  identified in the right middle lobe and lower lobe pulmonary  arteries. Pulmonary arterial opacification is not optimal on the  standard venous phase evaluation of the chest but the right sided  pulmonary embolism is clearly present.  There are small bilateral pulmonary nodules. Right-sided pulmonary  nodule measuring 6 mm likely localizes to the very top of the lower  lobe near the major fissure. Left upper lobe nodule measures 7 mm.  Lingular nodule measures 7 mm. Left lower lobe nodule measures 7 mm.  Faint peripheral right middle lobe nodule measures 3 mm. Inferior  right middle lobe  nodule measures 6 mm. Several small nodules in the  posterior right upper lobe identified in the 3-4 mm range.  No evidence of pleural or pericardial fluid. No other bony lesions  identified in the thorax.  CT ABDOMEN AND PELVIS FINDINGS  Small low-density lesion in the left lobe of the liver measures 7 mm  and likely represents a cyst. No other hepatic abnormalities. The  gallbladder, pancreas, spleen, adrenal glands and kidneys are  unremarkable. No adenopathy is identified in the abdomen or pelvis.  Bowel shows normal caliber without evidence of obstruction, lesion  or inflammation. No abnormal fluid collections.  The bladder is moderately distended. Elongated cystic changes in  both adnexal regions identified which may represent a combination of  ovarian cysts and potentially hydrosalpinx. Correlation with pelvic  ultrasound  is recommended. No hernias are identified. The uterus  appears unremarkable by CT. No bony lesions are identified in the  abdomen or pelvis.  IMPRESSION:  1. 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  2. 3 cm right suprahilar mass may represent nodal metastasis versus  primary central tumor. Next number multiple small bilateral  pulmonary nodules consistent with metastatic disease.  3. Acute nonocclusive pulmonary embolism in the right middle lobe  and right lower lobe pulmonary arteries.  4. Massive thyroid goiter causing tracheal deviation to the right  and narrowing of the trachea.  5. Tubular cystic abnormalities of both adnexal regions which may be  a combination of cystic changes in the ovaries and potentially  hydrosalpinx. Correlation recommended with pelvic ultrasound to  exclude any suspicious cystic lesions of the adnexae.  6. Critical Value/emergent results were called by telephone at the  time of interpretation on  12/14/2013 at 4:52 pm to Dr. Tamala Julian of the  [facility] Medicine Teaching Service, who verbally acknowledged these  results.  Electronically Signed  By: Aletta Edouard M.D.  On: 12/14/2013 16:53   MRI T-spine 9/23:  CLINICAL DATA: Lower extremity weakness. Worsening back pain.  Compressive upper thoracic mass partially visualized on cervical  spine MRI as well as chest CT with spinal cord compression.  EXAM:  MRI THORACIC SPINE WITHOUT CONTRAST  TECHNIQUE:  Multiplanar, multisequence MR imaging of the thoracic spine was  performed. No intravenous contrast was administered.  COMPARISON: Cervical spine MRI and chest CT earlier today  FINDINGS:  There is a T3 compression fracture with approximately 50% height  loss centrally. As described on cervical spine MRI and chest CT  earlier today, there is abnormal soft tissue replacing the majority  of the T3 vertebral body extending into the pedicles as well as into  the ventral epidural space with moderate cord compression. Epidural  extension is slightly greater on the right and extends into the  inferior aspect of the right T2-3 neural foramen as well as superior  aspect of the right T3-4 neural foramen. There is also abnormal  paravertebral soft tissue laterally and anteriorly as seen on CT.  Central ventral epidural material posterior to the T4 vertebral body  may represent tumor or hematoma. Mild T2 hyperintensity is present  in the spinal cord at the T3 level extending inferiorly to the T4-5  disc space level consistent with mild edema. Mild edema is present  in the posterior paraspinal soft tissues of the upper thoracic  spine. No other vertebral body lesions are identified.  There is a moderate-sized central disc extrusion at T7-8 which  contacts and mildly flattens the ventral spinal cord with mild  spinal stenosis. A small left central disc protrusion is present at  T8-9 with at most minimal impression on the spinal cord. Massive   enlargement of the left thyroid lobe is partially visualized.  Mediastinal and lung findings are better demonstrated on recent  chest CT.  IMPRESSION:  1. T3 vertebral body mass with pathologic compression fracture and  epidural tumor resulting in moderate cord compression with evidence  of mild spinal cord edema.  2. T7-8 disc extrusion resulting in mild spinal stenosis.   MRI Head / C-Spine 9/23:  CLINICAL DATA: 48 year old female who presented with back pain and  lower extremity numbness, with continued inadequately explained  lower extremity weakness following Hospital discharge on 12/12/2013.  Initial  encounter.  EXAM:  MRI HEAD WITHOUT AND WITH CONTRAST  MRI CERVICAL SPINE WITHOUT AND WITH CONTRAST  TECHNIQUE:  Multiplanar, multiecho pulse sequences of the brain and surrounding  structures, and cervical spine, to include the craniocervical  junction and cervicothoracic junction, were obtained without and  with intravenous contrast.  CONTRAST: 20 mL MultiHance.  COMPARISON: Lumbar MRI 12/10/2013.  FINDINGS:  MRI HEAD FINDINGS  Cerebral volume is normal. No restricted diffusion to suggest acute  infarction. No midline shift, mass effect, evidence of mass lesion,  ventriculomegaly, extra-axial collection or acute intracranial  hemorrhage. Cervicomedullary junction and pituitary are within  normal limits. Major intracranial vascular flow voids are preserved.  Pearline Cables and white matter signal is within normal limits for age  throughout the brain. No abnormal enhancement identified, pulsation  artifact prominent on axial post-contrast images.  Large body habitus. Small right frontal scalp lipoma (series 10,  image 37). Visible internal auditory structures appear normal. Mild  right mastoid fluid. Minor paranasal sinus mucosal thickening.  Visualized orbit soft tissues are within normal limits. Partially  visible thyromegaly greater on the left, see below. Normal bone  marrow  signal.  MRI CERVICAL SPINE FINDINGS  Large body habitus resulting in wrap artifact on axial images.  However, there is massive thyroid enlargement evident, with the left  lobe measuring at least 7.2 x 5.2 x 11.2 cm (AP by transverse by  CC). The trachea is displaced to the right at the thoracic inlet and  mildly compressed. There is other regional mass effect including  sheath. On the left carotid  Fairly preserved cervical lordosis. No marrow edema or evidence of  acute osseous abnormality. Cervicomedullary junction is within  normal limits. Spinal cord signal is within normal limits at all  visualized levels. No abnormal intradural enhancement.  C2-C3: Negative.  C3-C4: Disc osteophyte complex, facet and ligament flavum  hypertrophy. Along with a degree of congenital canal narrowing,  there is mild spinal stenosis and moderate biforaminal stenosis.  C4-C5: Mild uncovertebral and facet hypertrophy. Mild right C5  foraminal stenosis.  C5-C6: Circumferential disc osteophyte complex. Along with a degree  of congenital canal narrowing there is mild spinal stenosis.  Moderate right C6 foraminal stenosis.  C6-C7: Negative except for mild facet hypertrophy.  C7-T1: Negative.  T1-T2: Negative.  Faintly visualized at the T3 level in the upper thoracic spine there  is a lobulated solidly enhancing mass which appears to extend into  the epidural space from the T3 vertebral body. This is inseparable  from the spinal cord which is likely compressed. See series 2000  image 7 and series 1500, image 6. This is not included on axial  images.  IMPRESSION:  1. Faintly visible but aggressive appearing enhancing mass at the T3  level of the upper thoracic spine, likely with spinal cord  compression evaluate further are with thoracic MRI without and with  contrast.  2. Normal for age MRI appearance of the brain.  3. Massive thyroid enlargement, greater on the left. Left thyroid  goiter/mass  measuring at least 7.2 x 5.2 x 11.2 cm. Mass effect at  the thoracic inlet including on the trachea.  4. Mild chronic disc and endplate degeneration in the cervical spine  at C3-C4 and C5-C6 resulting in borderline to mild spinal stenosis,  but no cervical spinal cord mass effect or signal abnormality.  Electronically Signed:  By: Lars Pinks M.D.  On: 12/14/2013 09:50   MRI L-Spine 9/19:  CLINICAL DATA: Back pain with bilateral leg  numbness, worsening  with oral steroids.  EXAM:  MRI LUMBAR SPINE WITHOUT CONTRAST  TECHNIQUE:  Multiplanar, multisequence MR imaging of the lumbar spine was  performed. No intravenous contrast was administered.  COMPARISON: CT 12/09/2013  FINDINGS:  There is no degenerative disease at L2-3 or above. The discs are  normal in signal characteristics and morphology. The spinal canal  and foramina are widely patent. The distal cord and conus are normal  with conus tip at L1. There are small benign and insignificant  hemangiomas within the L2 and L3 vertebral bodies.  L3-4: Minimal desiccation of the disc. Mild circumferential bulging  slightly more prominent towards the left. Bilateral facet  degeneration and hypertrophy. Mild narrowing of the lateral recesses  without apparent neural compression. Mild foraminal narrowing on the  left without apparent neural compression.  L4-5: Desiccation of the disc with circumferential protrusion.  Bilateral facet and ligamentous hypertrophy. Stenosis of both  lateral recesses without definite focal neural compression. Mild  foraminal narrowing bilaterally.  L5-S1: Desiccation of the disc with loss of height. Endplate  osteophytes and bulging of the disc more prominent towards the left.  Bilateral facet degeneration and hypertrophy left more than right.  Stenosis of the subarticular lateral recess on the left that could  compress the S1 nerve root. Mild foraminal narrowing left more than  right but without apparent  compression of the exiting L5 nerve  roots.  Serpiginous fluid-filled structure in the left pelvis could  represent hydrosalpinx. This is not primarily or completely imaged.  IMPRESSION:  Degenerative disc disease and degenerative facet disease in the  lower lumbar spine that could be a cause of lumbago.  Lateral recess and foraminal narrowing at L3-4, L4-5 and L5-S1 as  described above that could be a cause of focal neural compression.  In general, this is more pronounced on the left than on the right.  In particular, there is potential for left S1 nerve root compression  in the subarticular lateral recess at L5-S1.  Electronically Signed  By: Nelson Chimes M.D.  On: 12/10/2013 08:45   CT L-Spine 9/18:  CLINICAL DATA: Low back pain with difficulty walking.  EXAM:  CT LUMBAR SPINE WITHOUT CONTRAST  TECHNIQUE:  Multidetector CT imaging of the lumbar spine was performed without  intravenous contrast administration. Multiplanar CT image  reconstructions were also generated.  COMPARISON: None.  FINDINGS:  There is no fracture or spondylolisthesis. There is moderately  severe disc space narrowing at L5-S1 with slight vacuum phenomenon  at this level. There is moderate narrowing at L4-5. There are  prominent anterior osteophytes at L2, L3, L4, and L5.  At T12-L1, there is moderate facet hypertrophy bilaterally. There is  no nerve root edema or effacement. No disc extrusion or stenosis.  At L1-2, there is moderate facet hypertrophy bilaterally. There is  mild diffuse disc bulging. There is no nerve root edema or  effacement. No disc extrusion or stenosis.  At L2-3, there is broad-based disc bulging and moderate facet  hypertrophy bilaterally. There is borderline narrowing of the thecal  sac with borderline spinal stenosis. No disc extrusion.  At L3-4, there is severe facet osteoarthritic change bilaterally,  more pronounced on the left than on the right. There is broad-based  disc  bulging and moderate ligamentum flavum hypertrophy. These  findings in concert lead to moderate generalized spinal stenosis.  At L4-5, there is broad-based disc protrusion and severe facet  osteoarthritic change bilaterally. These findings in concert lead to  moderate generalized  spinal stenosis. No frank disc extrusion.  At L5-S1, there is severe facet osteoarthritic change on the left  and moderate facet osteoarthritic change on the right. There is  broad-based disc protrusion. There is exit foraminal narrowing  bilaterally, more severe on the left than on the right, due to  osteophytic change. No frank disc extrusion or stenosis is seen at  this level.  There are no paraspinous lesions.  IMPRESSION:  Spinal stenosis at L3-4 and L4-5, multifactorial. Borderline spinal  stenosis at L2-3. There does appear to be a degree of congenital  narrowing of the canal which contributes to spinal stenosis.  Multilevel arthropathy.  No frank disc extrusion.  No fracture or spondylolisthesis.  Electronically Signed  By: Lowella Grip M.D.  On: 12/09/2013 13:50   CXR 9/13:  CLINICAL DATA: Upper back pain for 5 weeks. Dyspnea on exertion.  Nonsmoker. History of hypertension.  EXAM:  CHEST 2 VIEW  COMPARISON: 04/15/2006  FINDINGS:  The heart is mildly enlarged. There is elevation of right  hemidiaphragm, stable in appearance. There is streaky density  favored to be within the right lower lobe, consistent with  atelectasis or infiltrate. No pulmonary edema. No pleural effusions.  There are moderate degenerative changes in the thoracic spine.  IMPRESSION:  Right lower lobe atelectasis or infiltrate.  Electronically Signed  By: Shon Hale M.D.  On: 12/04/2013 19:26  Venous Duplex 9/24:  Summary:  - No evidence of deep vein thrombosis involving the right lower extremity. - No evidence of deep vein thrombosis involving the left lower extremity.  Other specific details can be found in  the table(s) above. Prepared and Electronically Authenticated by  Adele Barthel, MD 2015-09-24T20:12:39   Aquilla Hacker, MD 12/16/2013, 7:24 AM PGY-1, [place] Intern pager: [phone], text pages welcome

## 2013-12-16 NOTE — Progress Notes (Signed)
FMTS Attending Note Patient seen and examined by me today, discussed with resident team and I agree with Dr Wendie Agreste note.  Patient's dorsi/plantarflexion strength slightly diminished from my own exam yesterday afternoon. Is able to distinguish temperature, fine touch. Reports that her pain is well controlled.  Pulse is better controlled this morning.  Awaiting tissue biopsy results, appreciate Dr. Virgie Dad input on direction of our search for primary when this becomes available.  Will await further input from Neurosurgery regarding the following: 1. FUrther plans for OR, that can help determine when it will be safe to initiate anticoagulation for pulmonary embolism (patient will be 48-hours post-op in the early hours of 09/26).  If further OR plans, may alter our plans to start heparin on 09/26 AM; 2. Duration of time patient should be treated with Dexadron.   Dalbert Mayotte, MD

## 2013-12-16 NOTE — Clinical Social Work Note (Signed)
Clinical Social Worker continuing to follow patient and family for support and discharge planning needs.  [name] currently on bed rest.  Per chart, patient and family agreeable to inpatient rehab vs. Possible LOG SNF placement.  Patient has a bed available at [facility], [place], and [place].  Patient family leaning towards [facility] [place] left message with facility to confirm bed offer.  CSW remains available for support and discharge planning needs in the event that patient is not approved for inpatient rehab.  Sarah [name], [place]

## 2013-12-16 NOTE — Progress Notes (Signed)
ANTICOAGULATION CONSULT NOTE - Initial Consult  Pharmacy Consult for Heparin  Indication: pulmonary embolus  Allergies  Allergen Reactions  . Tramadol Nausea And Vomiting    Patient Measurements: Height: 5\' 11"  (180.3 cm) Weight: 313 lb 15 oz (142.4 kg) IBW/kg (Calculated) : 70.8 Heparin Dosing Weight: 104 kg   Vital Signs: Temp: 98.2 F (36.8 C) (09/25 2038) Temp src: Oral (09/25 2038) BP: 142/68 mmHg (09/25 2300) Pulse Rate: 74 (09/25 2300)  Labs:  Recent Labs  12/15/13 0314 12/15/13 1205 12/16/13 1025  HGB  --  10.6* 10.4*  HCT  --  31.2* 31.1*  PLT  --  244 239  CREATININE 1.08  --   --     Estimated Creatinine Clearance: 101 ml/min (by C-G formula based on Cr of 1.08).   Medical History: Past Medical History  Diagnosis Date  . Hypertension   . DDD (degenerative disc disease), cervical   . DJD (degenerative joint disease)   . Obesity   . Chronic back pain     Medications:  Prescriptions prior to admission  Medication Sig Dispense Refill  . albuterol (PROVENTIL HFA;VENTOLIN HFA) 108 (90 BASE) MCG/ACT inhaler Inhale 2 puffs into the lungs every 6 (six) hours as needed for wheezing or shortness of breath.      . diazepam (VALIUM) 5 MG tablet Take 5 mg by mouth 3 (three) times daily as needed for muscle spasms (pain).      [name] Kitchen ibuprofen (ADVIL,MOTRIN) 200 MG tablet Take 600 mg by mouth every 6 (six) hours as needed for mild pain.       . cyclobenzaprine (FLEXERIL) 5 MG tablet Take 1 tablet (5 mg total) by mouth 3 (three) times daily as needed for muscle spasms.  30 tablet  0  . diclofenac (VOLTAREN) 75 MG EC tablet Take 1 tablet (75 mg total) by mouth 2 (two) times daily.  14 tablet  0  . HYDROcodone-acetaminophen (NORCO/VICODIN) 5-325 MG per tablet Take 1 tablet by mouth every 4 (four) hours as needed.  20 tablet  0  . losartan (COZAAR) 25 MG tablet Take 0.5 tablets (12.5 mg total) by mouth daily.  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 She had surgery on 9/24 for decompression. She was also found to have a PE on CT chest. She will 48 hours post surgery in AM on 9/26. Pharmacy consulted to start heparin infusion at 0200 on 9/26. She is currently on subQ heparin and her last dose was given at 2152. Hgb low at 10.4. Plt wnl.   Goal of Therapy:  Heparin level 0.3-0.7 units/ml Monitor platelets by anticoagulation protocol: Yes   Plan:  1) D/c SubQ heparin  2) Start heparin infusion at 1700 units/hr at 0200 on 9/26. Give small bolus of 2000 units  3) F/u 6 hr level  3) Monitor CBC, daily HL and s/s of bleeding    Albertina Parr, PharmD.  Clinical Pharmacist Pager [phone]

## 2013-12-16 NOTE — Progress Notes (Signed)
Patient ID: Sarah [name], female   DOB: May 02, 1965, 48 y.o.   MRN: [id number] BP 134/72  Pulse 84  Temp(Src) 98.8 F (37.1 C) (Oral)  Resp 17  Ht 5\' 11"  (1.803 m)  Wt 142.4 kg (313 lb 15 oz)  BMI 43.80 kg/m2  SpO2 98%  LMP 11/28/2013 Alert and oriented x 4 No change in motor exam 0/5 lower extremities Sensation may have improved, stated she could feel my hand on her feet today.  Awaiting pathology

## 2013-12-16 NOTE — Progress Notes (Signed)
PT Cancellation Note  Patient Details Name: DARLA MCDONALD MRN: [id number] DOB: 10/13/1965   Cancelled Treatment:    Reason Eval/Treat Not Completed: Patient not medically ready; still on bedrest and noted MD note needs surgical stabilization following recent decompression.  RN to page if change in activity orders today.   WYNN,CYNDI 12/16/2013, 11:56 AM

## 2013-12-17 LAB — CBC WITH DIFFERENTIAL/PLATELET
Basophils Absolute: 0 10*3/uL (ref 0.0–0.1)
Basophils Relative: 0 % (ref 0–1)
Eosinophils Absolute: 0 10*3/uL (ref 0.0–0.7)
Eosinophils Relative: 0 % (ref 0–5)
HCT: 31.2 % — ABNORMAL LOW (ref 36.0–46.0)
HEMOGLOBIN: 10.6 g/dL — AB (ref 12.0–15.0)
LYMPHS ABS: 1.4 10*3/uL (ref 0.7–4.0)
LYMPHS PCT: 12 % (ref 12–46)
MCH: 28.6 pg (ref 26.0–34.0)
MCHC: 34 g/dL (ref 30.0–36.0)
MCV: 84.3 fL (ref 78.0–100.0)
Monocytes Absolute: 0.5 10*3/uL (ref 0.1–1.0)
Monocytes Relative: 4 % (ref 3–12)
NEUTROS ABS: 10.2 10*3/uL — AB (ref 1.7–7.7)
NEUTROS PCT: 84 % — AB (ref 43–77)
PLATELETS: 268 10*3/uL (ref 150–400)
RBC: 3.7 MIL/uL — AB (ref 3.87–5.11)
RDW: 13.8 % (ref 11.5–15.5)
WBC: 12 10*3/uL — AB (ref 4.0–10.5)

## 2013-12-17 LAB — BASIC METABOLIC PANEL
Anion gap: 13 (ref 5–15)
BUN: 21 mg/dL (ref 6–23)
CHLORIDE: 98 meq/L (ref 96–112)
CO2: 25 mEq/L (ref 19–32)
Calcium: 8.8 mg/dL (ref 8.4–10.5)
Creatinine, Ser: 0.78 mg/dL (ref 0.50–1.10)
GFR calc non Af Amer: >90 mL/min (ref >90)
Glucose, Bld: 191 mg/dL — ABNORMAL HIGH (ref 70–99)
POTASSIUM: 4.7 meq/L (ref 3.7–5.3)
SODIUM: 136 meq/L — AB (ref 137–147)

## 2013-12-17 LAB — GLUCOSE, CAPILLARY
Glucose-Capillary: 156 mg/dL — ABNORMAL HIGH (ref 70–99)
Glucose-Capillary: 221 mg/dL — ABNORMAL HIGH (ref 70–99)
Glucose-Capillary: 227 mg/dL — ABNORMAL HIGH (ref 70–99)
Glucose-Capillary: 260 mg/dL — ABNORMAL HIGH (ref 70–99)

## 2013-12-17 LAB — HEPARIN LEVEL (UNFRACTIONATED)
HEPARIN UNFRACTIONATED: 0.88 [IU]/mL — AB (ref 0.30–0.70)
Heparin Unfractionated: 0.7 IU/mL (ref 0.30–0.70)

## 2013-12-17 MED ORDER — INFLUENZA VAC SPLIT QUAD 0.5 ML IM SUSY
0.5000 mL | PREFILLED_SYRINGE | INTRAMUSCULAR | Status: DC
  Filled 2013-12-17: qty 0.5

## 2013-12-17 MED ORDER — HEPARIN BOLUS VIA INFUSION
2000.0000 [IU] | Freq: Once | INTRAVENOUS | Status: AC
  Administered 2013-12-17: 2000 [IU] via INTRAVENOUS
  Filled 2013-12-17: qty 2000

## 2013-12-17 MED ORDER — HEPARIN (PORCINE) IN NACL 100-0.45 UNIT/ML-% IJ SOLN
1600.0000 [IU]/h | INTRAMUSCULAR | Status: DC
  Administered 2013-12-17: 1500 [IU]/h via INTRAVENOUS
  Administered 2013-12-17: 1700 [IU]/h via INTRAVENOUS
  Administered 2013-12-18 – 2013-12-22 (×6): 1500 [IU]/h via INTRAVENOUS
  Administered 2013-12-23: 1600 [IU]/h via INTRAVENOUS
  Filled 2013-12-17 (×10): qty 250

## 2013-12-17 NOTE — Progress Notes (Signed)
Received to room 4N18 via bed. Patient states will wait until tylenol dose is due for medications. Oriented to unit and safety needs.

## 2013-12-17 NOTE — Progress Notes (Addendum)
ANTICOAGULATION CONSULT NOTE - Follow Up Consult  Pharmacy Consult for Heparin Indication: pulmonary embolus  Allergies  Allergen Reactions  . Tramadol Nausea And Vomiting    Patient Measurements: Height: 5\' 11"  (180.3 cm) Weight: 313 lb 15 oz (142.4 kg) IBW/kg (Calculated) : 70.8 Heparin Dosing Weight: 104 kg  Vital Signs: Temp: 98.6 F (37 C) (09/26 0352) Temp src: Oral (09/26 0352) BP: 139/71 mmHg (09/26 1000) Pulse Rate: 68 (09/26 0900)  Labs:  Recent Labs  12/15/13 0314  12/15/13 1205 12/16/13 1025 12/17/13 0324 12/17/13 0910  HGB  --   < > 10.6* 10.4* 10.6*  --   HCT  --   --  31.2* 31.1* 31.2*  --   PLT  --   --  244 239 268  --   HEPARINUNFRC  --   --   --   --   --  0.88*  CREATININE 1.08  --   --   --  0.78  --   < > = values in this interval not displayed.  Estimated Creatinine Clearance: 136.4 ml/min (by C-G formula based on Cr of 0.78).   Medications:  Heparin at 1700 units/hr.   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 She had surgery on 9/24 for decompression. She was also found to have a PE on CT chest started on IV heparin 48 hours post-operatively. Heparin level is supra-therapeutic at 0.88 this am on 1700 units/hr and after 2000 unit bolus.   H/H is low but stable. Platelets are improved. No bleeding reported.   Goal of Therapy:  Heparin level 0.3-0.7 units/ml Monitor platelets by anticoagulation protocol: Yes   Plan:  1. Decrease heparin to 1500 units/hr. 2. Repeat heparin level in 6 hours.  3. Continue to monitor daily heparin level and CBC.   Sloan Leiter, PharmD, BCPS Clinical Pharmacist [phone] 12/17/2013,10:41 AM   Addendum:  Repeat Heparin level is therapeutic 0.7 on 1500 units/hr.  No reported bleeding.   Plan: Continue heparin at current rate.  Follow-up AM level.   Sloan Leiter, PharmD, BCPS Clinical Pharmacist [phone] 12/17/2013,  7:23 PM

## 2013-12-17 NOTE — Progress Notes (Signed)
Family Medicine Teaching Service Daily Progress Note Intern Pager: [phone]  Patient name: Sarah [name] Medical record number: [id number] Date of birth: [birth year] Age: 48 y.o. Gender: female  Primary Care Provider: No PCP Per Patient Consultants: None Code Status: Full  Pt Overview and Major Events to Date:  - 9/22: Pt. Admitted with lower extremity weakness.  - 9/23: Pt. Found to have mass at T-3 level with spinal compression.  - 9/24: Surgery performed for decompression and biopsy overnight. Pt. Post-op and doing well. 3cm Suprahilar mass on CT Chest, PE also identified. ?Thyroid etiology, but much less likely for mets. Oncology consulted.  - 9/25: Awaiting pathology results with plan for oncology to see once path returns. Pt. With some neurological improvement.   Assessment and Plan: Sarah [name] is a 48 y.o. female presenting with continued lower extremity weakness at baseline after discharge from hospital on 9/21. PMH is significant for obesity, HTN, and ?History of Hypothyroidism previously on Synthroid per patient.   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 Considering Mets vs. Primary tumor. Pulmonary lesion noted on CT as well as Large thyroid mass ? Goiter. Family history and social history on further evaluation reveal little in the way of risk factors. Subsequent CT chest, abdomen, pelvis with 3cm R Suprahilar pulmonary nodule and multiple smaller nodules. Neurosurgery took her to the OR on 9/23, and biopsied as well as performed laminectomy for decompression. - PT recommendations SNF with 24hour supervision. Social work on Mining engineer  - Decadron 27m q6hr.  - Large thyroid goiter noted. TSH normal, but likely needs ultrasound, ? Biopsy. Won't be able to get uptake scan due to recent contrast load due to MRI.  - Waiting  for surgical pathology in order to direct further workup and discussion regarding this patient. Oncology to see once pathology returns.  - flexeril, oxycodone for back pain.  - foley catheter in place - bed rest  - TSH, B12 Level, Mg, Phos - all relatively normal  - Random cortisol - Nl.  - ESR elevated, CRP elevted nonspecific but now concerning given new lesion on spine, and potential for mets.  - Overall this indicates a likely very poor prognosis. Most likely mets given presentation. Preliminary pathology (verbally communicated on 9/25) suggestive of possible thyroid primary but final stains pending. Will pursue further workup once we have more information, and pending discussion with oncology and neurosurgery.  - move out of neurosurgical ICU today  #Pulmonary Embolus - Found on CT Chest in R middle and R lower lobe pulmonary arteries. Pt. With brief episodes of tachycardia, but otherwise asymptomatic with O2 sats 100% on 2L [place]. Pt. Without chest pain at this time. Blood pressures stable. Lower extremity Venous Duplex negative for DVT.  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 ESR elevated.  - Now on IV heparin (waited 48 hrs post surgery) - Will continue to monitor VS. Stable at this time.  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 PCOS vs. Adrenal pathology?.[name] KitchenWould be unlike to be first presentation of HPA pathology; no evidence of salt wasting on labs. Hypertensive and not hypotensive. Testing during this hospitalization after having recently received steroids would be largely non-diagnostic. Though, could also be a combination of isolated processes i.e. Clitoromegaly, Hirsutism 2/2 obesity. Will need ongoing outpatient evaluation.  -  Recent thought of outpt vs inpt w/up including [place] to assess ovaries vs 21-hydroxylase/ACTH/cortisol.  - TSH, Cortisol WNL - A1C : 6.7 - consistent with PCOS given evidence of  cystic structures in pelvis.   #HTN- BP largely uncontrolled 150s-180s/70s-90s; previously on BP med that made her feel badly? Unclear what kind  -started with losartan 12.40m at last admission.  -continue losartan here.  - Blood pressure well controlled here.    #Obesity- likely also contributing to ambulation issues  -needs aggressive dietary and exercise intervention  - consistent with potential PCOS.   FEN/GI: Reg diet; KVO; no stool in 4-5 days with large stool burden and moderate abdominal distension on exam  - miralax daily  - give enema today Prophylaxis: on full dose IV heparin  Disposition: Neurosurgery and Oncology on board. Will continue work up as indicated. Anticipate d/c to SNF (social work following). Move out of neurosurgical ICU today.  Subjective:  Pt states that she is doing well this am. Pain is well controlled with tylenol. Getting some improvement in sensation. No BM in several days, amenable to enema today.  Objective: Temp:  [97.7 F (36.5 C)-98.8 F (37.1 C)] 98.6 F (37 C) (09/26 0352) Pulse Rate:  [66-90] 69 (09/26 0600) Resp:  [15-20] 16 (09/26 0600) BP: (115-156)/(62-79) 127/68 mmHg (09/26 0600) SpO2:  [98 %-100 %] 98 % (09/26 0600) Physical Exam: General: NAD, AAOx3, morbidly obese  HEENT: R  Sided Thyromegaly / Goiter noted on exam without tenderness, and no nodules, no LAD, neck supple  Abdomen: Soft and nontender, No organomegaly though exam limited by body habitus. Extremities: no edema Skin: No lesions or rashes noted  Neuro: cranial nerves grossly normal. Normal speech, minimal ability to plantarflex bilat feet. full sensation to light touch, no sensory deficits. + babinski bilaterally  Laboratory:  Recent Labs Lab 12/15/13 1205 12/16/13 1025 12/17/13 0324  WBC 9.3 12.3* 12.0*  HGB 10.6* 10.4* 10.6*  HCT 31.2* 31.1* 31.2*  PLT 244 239 268    Recent Labs Lab 12/13/13 1055 12/15/13 0314 12/17/13 0324  NA 138 136* 136*  K 3.7  4.5 4.7  CL 96 97 98  CO2 _0 BUN _1 CREATININE 0.80 1.08 0.78  CALCIUM 8.8 8.5 8.8  PROT  --  7.0  --   BILITOT  --  1.4*  --   ALKPHOS  --  118*  --   ALT  --  19  --   AST  --  30  --   GLUCOSE 121* 216* 191*   Urinalysis    Component Value Date/Time   COLORURINE YELLOW 12/10/2013 1036   APPEARANCEUR CLOUDY* 12/10/2013 1036   LABSPEC 1.022 12/10/2013 1036   PHURINE 5.5 12/10/2013 1036   GLUCOSEU NEGATIVE 12/10/2013 1036   HGBUR NEGATIVE 12/10/2013 1036   BILIRUBINUR NEGATIVE 12/10/2013 1036   KETONESUR NEGATIVE 12/10/2013 1036   PROTEINUR NEGATIVE 12/10/2013 1036   UROBILINOGEN 1.0 12/10/2013 1036   NITRITE NEGATIVE 12/10/2013 1036   LEUKOCYTESUR NEGATIVE 12/10/2013 1036    Phos - 3.9 Mg - 2.2 CRP - 11.6 ESR - 59 CK - 56 B12 - 209 Random Cortisol - 25.4 A1C - 6.7 TSH - 2.13 RPR - nonreactive HIV - nonreactive UPT - Negative  Imaging/Diagnostic Tests:  CT Chest Abd/ Pelvis 9/23:  CLINICAL DATA: Destructive metastatic lesion of the T3 vertebral  body with no known primary carcinoma.  EXAM:  CT CHEST, ABDOMEN, AND PELVIS WITH CONTRAST  TECHNIQUE:  Multidetector  CT imaging of the chest, abdomen and pelvis was  performed following the standard protocol during bolus  administration of intravenous contrast.  CONTRAST: 137m OMNIPAQUE IOHEXOL 300 MG/ML SOLN  COMPARISON: MRI of the cervical spine earlier today.  FINDINGS:  CT CHEST FINDINGS  Within the chest, a right suprahilar mass measures approximately 3.0  x 2.7 x 2.2 cm. This either represents a nodal metastasis or central  lung mass such as small cell carcinoma. A number of smaller  mediastinal lymph nodes are also identified in the right  paratracheal region, prevascular space and AP window.  Destructive lesion of the T3 vertebral body noted which nearly  replaces the entire vertebral body with tumor extension posteriorly  into the epidural space causing significant compression of the  spinal cord.  Paravertebral extension of tumor is also suspected  lateral and anterior to the vertebral body, right greater than left.  Massive enlargement of the thyroid gland present with transverse  dimensions of approximately 7.7 x 8.5 cm. The thyroid gland is  incompletely visualized as the neck was not included. Central  dystrophic calcification identified. The enlarged thyroid gland does  cause significant mass effect on the airway with evidence of  tracheal deviation to the right and tracheal narrowing at the  thoracic inlet. The enlarged thyroid gland extends into the superior  mediastinum.  There is evidence of pulmonary embolism with nonocclusive thrombus  identified in the right middle lobe and lower lobe pulmonary  arteries. Pulmonary arterial opacification is not optimal on the  standard venous phase evaluation of the chest but the right sided  pulmonary embolism is clearly present.  There are small bilateral pulmonary nodules. Right-sided pulmonary  nodule measuring 6 mm likely localizes to the very top of the lower  lobe near the major fissure. Left upper lobe nodule measures 7 mm.  Lingular nodule measures 7 mm. Left lower lobe nodule measures 7 mm.  Faint peripheral right middle lobe nodule measures 3 mm. Inferior  right middle lobe nodule measures 6 mm. Several small nodules in the  posterior right upper lobe identified in the 3-4 mm range.  No evidence of pleural or pericardial fluid. No other bony lesions  identified in the thorax.  CT ABDOMEN AND PELVIS FINDINGS  Small low-density lesion in the left lobe of the liver measures 7 mm  and likely represents a cyst. No other hepatic abnormalities. The  gallbladder, pancreas, spleen, adrenal glands and kidneys are  unremarkable. No adenopathy is identified in the abdomen or pelvis.  Bowel shows normal caliber without evidence of obstruction, lesion  or inflammation. No abnormal fluid collections.  The bladder is moderately distended.  Elongated cystic changes in  both adnexal regions identified which may represent a combination of  ovarian cysts and potentially hydrosalpinx. Correlation with pelvic  ultrasound is recommended. No hernias are identified. The uterus  appears unremarkable by CT. No bony lesions are identified in the  abdomen or pelvis.  IMPRESSION:  1. 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  2. 3 cm right suprahilar mass may represent nodal metastasis versus  primary central tumor. Next number multiple small bilateral  pulmonary nodules consistent with metastatic disease.  3. Acute nonocclusive pulmonary embolism in the right middle lobe  and right lower lobe pulmonary arteries.  4. Massive thyroid goiter causing tracheal deviation to the right  and narrowing of the trachea.  5.  Tubular cystic abnormalities of both adnexal regions which may be  a combination of cystic changes in the ovaries and potentially  hydrosalpinx. Correlation recommended with pelvic ultrasound to  exclude any suspicious cystic lesions of the adnexae.  6. Critical Value/emergent results were called by telephone at the  time of interpretation on 12/14/2013 at 4:52 pm to Dr. Tamala Julian of the  [facility] Medicine Teaching Service, who verbally acknowledged these  results.  Electronically Signed  By: Aletta Edouard M.D.  On: 12/14/2013 16:53   MRI T-spine 9/23:  CLINICAL DATA: Lower extremity weakness. Worsening back pain.  Compressive upper thoracic mass partially visualized on cervical  spine MRI as well as chest CT with spinal cord compression.  EXAM:  MRI THORACIC SPINE WITHOUT CONTRAST  TECHNIQUE:  Multiplanar, multisequence MR imaging of the thoracic spine was  performed. No intravenous contrast was administered.  COMPARISON: Cervical spine MRI and chest CT earlier today  FINDINGS:  There is a T3  compression fracture with approximately 50% height  loss centrally. As described on cervical spine MRI and chest CT  earlier today, there is abnormal soft tissue replacing the majority  of the T3 vertebral body extending into the pedicles as well as into  the ventral epidural space with moderate cord compression. Epidural  extension is slightly greater on the right and extends into the  inferior aspect of the right T2-3 neural foramen as well as superior  aspect of the right T3-4 neural foramen. There is also abnormal  paravertebral soft tissue laterally and anteriorly as seen on CT.  Central ventral epidural material posterior to the T4 vertebral body  may represent tumor or hematoma. Mild T2 hyperintensity is present  in the spinal cord at the T3 level extending inferiorly to the T4-5  disc space level consistent with mild edema. Mild edema is present  in the posterior paraspinal soft tissues of the upper thoracic  spine. No other vertebral body lesions are identified.  There is a moderate-sized central disc extrusion at T7-8 which  contacts and mildly flattens the ventral spinal cord with mild  spinal stenosis. A small left central disc protrusion is present at  T8-9 with at most minimal impression on the spinal cord. Massive  enlargement of the left thyroid lobe is partially visualized.  Mediastinal and lung findings are better demonstrated on recent  chest CT.  IMPRESSION:  1. T3 vertebral body mass with pathologic compression fracture and  epidural tumor resulting in moderate cord compression with evidence  of mild spinal cord edema.  2. T7-8 disc extrusion resulting in mild spinal stenosis.   MRI Head / C-Spine 9/23:  CLINICAL DATA: 48 year old female who presented with back pain and  lower extremity numbness, with continued inadequately explained  lower extremity weakness following Hospital discharge on 12/12/2013.  Initial encounter.  EXAM:  MRI HEAD WITHOUT AND WITH  CONTRAST  MRI CERVICAL SPINE WITHOUT AND WITH CONTRAST  TECHNIQUE:  Multiplanar, multiecho pulse sequences of the brain and surrounding  structures, and cervical spine, to include the craniocervical  junction and cervicothoracic junction, were obtained without and  with intravenous contrast.  CONTRAST: 20 mL MultiHance.  COMPARISON: Lumbar MRI 12/10/2013.  FINDINGS:  MRI HEAD FINDINGS  Cerebral volume is normal. No restricted diffusion to suggest acute  infarction. No midline shift, mass effect, evidence of mass lesion,  ventriculomegaly, extra-axial collection or acute intracranial  hemorrhage. Cervicomedullary junction and pituitary are within  normal limits. Major intracranial vascular flow voids are preserved.  Pearline Cables and white  matter signal is within normal limits for age  throughout the brain. No abnormal enhancement identified, pulsation  artifact prominent on axial post-contrast images.  Large body habitus. Small right frontal scalp lipoma (series 10,  image 37). Visible internal auditory structures appear normal. Mild  right mastoid fluid. Minor paranasal sinus mucosal thickening.  Visualized orbit soft tissues are within normal limits. Partially  visible thyromegaly greater on the left, see below. Normal bone  marrow signal.  MRI CERVICAL SPINE FINDINGS  Large body habitus resulting in wrap artifact on axial images.  However, there is massive thyroid enlargement evident, with the left  lobe measuring at least 7.2 x 5.2 x 11.2 cm (AP by transverse by  CC). The trachea is displaced to the right at the thoracic inlet and  mildly compressed. There is other regional mass effect including  sheath. On the left carotid  Fairly preserved cervical lordosis. No marrow edema or evidence of  acute osseous abnormality. Cervicomedullary junction is within  normal limits. Spinal cord signal is within normal limits at all  visualized levels. No abnormal intradural enhancement.  C2-C3:  Negative.  C3-C4: Disc osteophyte complex, facet and ligament flavum  hypertrophy. Along with a degree of congenital canal narrowing,  there is mild spinal stenosis and moderate biforaminal stenosis.  C4-C5: Mild uncovertebral and facet hypertrophy. Mild right C5  foraminal stenosis.  C5-C6: Circumferential disc osteophyte complex. Along with a degree  of congenital canal narrowing there is mild spinal stenosis.  Moderate right C6 foraminal stenosis.  C6-C7: Negative except for mild facet hypertrophy.  C7-T1: Negative.  T1-T2: Negative.  Faintly visualized at the T3 level in the upper thoracic spine there  is a lobulated solidly enhancing mass which appears to extend into  the epidural space from the T3 vertebral body. This is inseparable  from the spinal cord which is likely compressed. See series 2000  image 7 and series 1500, image 6. This is not included on axial  images.  IMPRESSION:  1. Faintly visible but aggressive appearing enhancing mass at the T3  level of the upper thoracic spine, likely with spinal cord  compression evaluate further are with thoracic MRI without and with  contrast.  2. Normal for age MRI appearance of the brain.  3. Massive thyroid enlargement, greater on the left. Left thyroid  goiter/mass measuring at least 7.2 x 5.2 x 11.2 cm. Mass effect at  the thoracic inlet including on the trachea.  4. Mild chronic disc and endplate degeneration in the cervical spine  at C3-C4 and C5-C6 resulting in borderline to mild spinal stenosis,  but no cervical spinal cord mass effect or signal abnormality.  Electronically Signed:  By: Lars Pinks M.D.  On: 12/14/2013 09:50   MRI L-Spine 9/19:  CLINICAL DATA: Back pain with bilateral leg numbness, worsening  with oral steroids.  EXAM:  MRI LUMBAR SPINE WITHOUT CONTRAST  TECHNIQUE:  Multiplanar, multisequence MR imaging of the lumbar spine was  performed. No intravenous contrast was administered.  COMPARISON: CT  12/09/2013  FINDINGS:  There is no degenerative disease at L2-3 or above. The discs are  normal in signal characteristics and morphology. The spinal canal  and foramina are widely patent. The distal cord and conus are normal  with conus tip at L1. There are small benign and insignificant  hemangiomas within the L2 and L3 vertebral bodies.  L3-4: Minimal desiccation of the disc. Mild circumferential bulging  slightly more prominent towards the left. Bilateral facet  degeneration and hypertrophy.  Mild narrowing of the lateral recesses  without apparent neural compression. Mild foraminal narrowing on the  left without apparent neural compression.  L4-5: Desiccation of the disc with circumferential protrusion.  Bilateral facet and ligamentous hypertrophy. Stenosis of both  lateral recesses without definite focal neural compression. Mild  foraminal narrowing bilaterally.  L5-S1: Desiccation of the disc with loss of height. Endplate  osteophytes and bulging of the disc more prominent towards the left.  Bilateral facet degeneration and hypertrophy left more than right.  Stenosis of the subarticular lateral recess on the left that could  compress the S1 nerve root. Mild foraminal narrowing left more than  right but without apparent compression of the exiting L5 nerve  roots.  Serpiginous fluid-filled structure in the left pelvis could  represent hydrosalpinx. This is not primarily or completely imaged.  IMPRESSION:  Degenerative disc disease and degenerative facet disease in the  lower lumbar spine that could be a cause of lumbago.  Lateral recess and foraminal narrowing at L3-4, L4-5 and L5-S1 as  described above that could be a cause of focal neural compression.  In general, this is more pronounced on the left than on the right.  In particular, there is potential for left S1 nerve root compression  in the subarticular lateral recess at L5-S1.  Electronically Signed  By: Nelson Chimes M.D.   On: 12/10/2013 08:45   CT L-Spine 9/18:  CLINICAL DATA: Low back pain with difficulty walking.  EXAM:  CT LUMBAR SPINE WITHOUT CONTRAST  TECHNIQUE:  Multidetector CT imaging of the lumbar spine was performed without  intravenous contrast administration. Multiplanar CT image  reconstructions were also generated.  COMPARISON: None.  FINDINGS:  There is no fracture or spondylolisthesis. There is moderately  severe disc space narrowing at L5-S1 with slight vacuum phenomenon  at this level. There is moderate narrowing at L4-5. There are  prominent anterior osteophytes at L2, L3, L4, and L5.  At T12-L1, there is moderate facet hypertrophy bilaterally. There is  no nerve root edema or effacement. No disc extrusion or stenosis.  At L1-2, there is moderate facet hypertrophy bilaterally. There is  mild diffuse disc bulging. There is no nerve root edema or  effacement. No disc extrusion or stenosis.  At L2-3, there is broad-based disc bulging and moderate facet  hypertrophy bilaterally. There is borderline narrowing of the thecal  sac with borderline spinal stenosis. No disc extrusion.  At L3-4, there is severe facet osteoarthritic change bilaterally,  more pronounced on the left than on the right. There is broad-based  disc bulging and moderate ligamentum flavum hypertrophy. These  findings in concert lead to moderate generalized spinal stenosis.  At L4-5, there is broad-based disc protrusion and severe facet  osteoarthritic change bilaterally. These findings in concert lead to  moderate generalized spinal stenosis. No frank disc extrusion.  At L5-S1, there is severe facet osteoarthritic change on the left  and moderate facet osteoarthritic change on the right. There is  broad-based disc protrusion. There is exit foraminal narrowing  bilaterally, more severe on the left than on the right, due to  osteophytic change. No frank disc extrusion or stenosis is seen at  this level.  There are  no paraspinous lesions.  IMPRESSION:  Spinal stenosis at L3-4 and L4-5, multifactorial. Borderline spinal  stenosis at L2-3. There does appear to be a degree of congenital  narrowing of the canal which contributes to spinal stenosis.  Multilevel arthropathy.  No frank disc extrusion.  No fracture or  spondylolisthesis.  Electronically Signed  By: Lowella Grip M.D.  On: 12/09/2013 13:50   CXR 9/13:  CLINICAL DATA: Upper back pain for 5 weeks. Dyspnea on exertion.  Nonsmoker. History of hypertension.  EXAM:  CHEST 2 VIEW  COMPARISON: 04/15/2006  FINDINGS:  The heart is mildly enlarged. There is elevation of right  hemidiaphragm, stable in appearance. There is streaky density  favored to be within the right lower lobe, consistent with  atelectasis or infiltrate. No pulmonary edema. No pleural effusions.  There are moderate degenerative changes in the thoracic spine.  IMPRESSION:  Right lower lobe atelectasis or infiltrate.  Electronically Signed  By: Shon Hale M.D.  On: 12/04/2013 19:26  Venous Duplex 9/24:  Summary:  - No evidence of deep vein thrombosis involving the right lower extremity. - No evidence of deep vein thrombosis involving the left lower extremity.   Leeanne Rio, MD 12/17/2013, 8:34 AM PGY-3, [place] Intern pager: [phone], text pages welcome

## 2013-12-17 NOTE — Progress Notes (Signed)
FMTS Attending Note Patient seen and examined by me, discussed with resident team and I agree with Dr Lennie Odor note as documented. Patient continues to have sensation (light touch, temp) in both feet; Babinski upgoing bilaterally. Reports adequate pain control.  No BM in the past several days.  Plan to follow up on final Path report of tissue biopsy. Will appreciate Oncology input as to direction of workup to determine primary.  Unclear whether there is need for further tissue (suprahilar mass) at this time.  PE: Has been started on heparin gtt.  Transfer out of Neuro ICU.  WIll appreciate Neurosurgery plan regarding possibility of return to OR; recommended duration of Decadron (continues to receive).  Dalbert Mayotte, MD

## 2013-12-17 NOTE — Progress Notes (Signed)
Subjective: Patient reports Offers no significant complaints of discomfort. No volitional movement of lower extremities.  Objective: Vital signs in last 24 hours: Temp:  [97.7 F (36.5 C)-98.8 F (37.1 C)] 98.6 F (37 C) (09/26 0352) Pulse Rate:  [66-90] 69 (09/26 0600) Resp:  [15-20] 16 (09/26 0600) BP: (115-156)/(62-79) 127/68 mmHg (09/26 0600) SpO2:  [98 %-100 %] 98 % (09/26 0600)  Intake/Output from previous day: 09/25 0701 - 09/26 0700 In: 1942 [I.V.:1942] Out: 4100 [Urine:4100] Intake/Output this shift:    Neurologic: Motor: No volitional movement of lower extremities. Some sensation preservation and distal left lower extremity right lower extremity has no sensation. Gait: Paraplegia Incisions clean and dry  Lab Results:  Recent Labs  12/16/13 1025 12/17/13 0324  WBC 12.3* 12.0*  HGB 10.4* 10.6*  HCT 31.1* 31.2*  PLT 239 268   BMET  Recent Labs  12/15/13 0314 12/17/13 0324  NA 136* 136*  K 4.5 4.7  CL 97 98  CO2 24 25  GLUCOSE 216* 191*  BUN 18 21  CREATININE 1.08 0.78  CALCIUM 8.5 8.8    Studies/Results: No results found.  Assessment/Plan: Paraplegia,  LOS: 5 days  Continue postoperative care ready for transfer to [facility] [address].   Jaskirat Schwieger J 12/17/2013, 8:32 AM

## 2013-12-18 LAB — CBC WITH DIFFERENTIAL/PLATELET
Basophils Absolute: 0 10*3/uL (ref 0.0–0.1)
Basophils Relative: 0 % (ref 0–1)
Eosinophils Absolute: 0 10*3/uL (ref 0.0–0.7)
Eosinophils Relative: 0 % (ref 0–5)
HEMATOCRIT: 32.2 % — AB (ref 36.0–46.0)
Hemoglobin: 10.9 g/dL — ABNORMAL LOW (ref 12.0–15.0)
Lymphocytes Relative: 13 % (ref 12–46)
Lymphs Abs: 1.6 10*3/uL (ref 0.7–4.0)
MCH: 28.5 pg (ref 26.0–34.0)
MCHC: 33.9 g/dL (ref 30.0–36.0)
MCV: 84.1 fL (ref 78.0–100.0)
MONO ABS: 1 10*3/uL (ref 0.1–1.0)
Monocytes Relative: 8 % (ref 3–12)
NEUTROS ABS: 9.2 10*3/uL — AB (ref 1.7–7.7)
Neutrophils Relative %: 79 % — ABNORMAL HIGH (ref 43–77)
PLATELETS: 264 10*3/uL (ref 150–400)
RBC: 3.83 MIL/uL — ABNORMAL LOW (ref 3.87–5.11)
RDW: 13.7 % (ref 11.5–15.5)
WBC: 11.8 10*3/uL — ABNORMAL HIGH (ref 4.0–10.5)

## 2013-12-18 LAB — BASIC METABOLIC PANEL
Anion gap: 13 (ref 5–15)
BUN: 21 mg/dL (ref 6–23)
CALCIUM: 8.8 mg/dL (ref 8.4–10.5)
CHLORIDE: 97 meq/L (ref 96–112)
CO2: 27 mEq/L (ref 19–32)
Creatinine, Ser: 0.85 mg/dL (ref 0.50–1.10)
GFR calc Af Amer: >90 mL/min (ref >90)
GFR calc non Af Amer: 80 mL/min — ABNORMAL LOW (ref >90)
Glucose, Bld: 199 mg/dL — ABNORMAL HIGH (ref 70–99)
Potassium: 4.7 mEq/L (ref 3.7–5.3)
Sodium: 137 mEq/L (ref 137–147)

## 2013-12-18 LAB — GLUCOSE, CAPILLARY
GLUCOSE-CAPILLARY: 191 mg/dL — AB (ref 70–99)
Glucose-Capillary: 247 mg/dL — ABNORMAL HIGH (ref 70–99)
Glucose-Capillary: 290 mg/dL — ABNORMAL HIGH (ref 70–99)

## 2013-12-18 LAB — HEPARIN LEVEL (UNFRACTIONATED): HEPARIN UNFRACTIONATED: 0.58 [IU]/mL (ref 0.30–0.70)

## 2013-12-18 NOTE — Progress Notes (Signed)
Family Medicine Teaching Service Daily Progress Note Intern Pager: [phone]  Patient name: Sarah [name] Medical record number: [id number] Date of birth: 1965-05-04 Age: 48 y.o. Gender: female  Primary Care Provider: No PCP Per Patient Consultants: None Code Status: Full  Pt Overview and Major Events to Date:  - 9/22: Pt. Admitted with lower extremity weakness.  - 9/23: Pt. Found to have mass at T-3 level with spinal compression.  - 9/24: Surgery performed for decompression and biopsy overnight. Pt. Post-op and doing well. 3cm Suprahilar mass on CT Chest, PE also identified. ?Thyroid etiology, but much less likely for mets. Oncology consulted.  - 9/25 - 9/27: Awaiting pathology results with plan for oncology to see once path returns. Pt. With little neurological improvement. Begin light PT per our discussion with neurosurgery.   Assessment and Plan: Sarah [name] is a 48 y.o. female presenting with continued lower extremity weakness at baseline after discharge from hospital on 9/21. PMH is significant for obesity, HTN, and ?History of Hypothyroidism previously on Synthroid per patient.   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 Considering Mets vs. Primary tumor. Pulmonary lesion noted on CT as well as Large thyroid mass ? Goiter. Family history and social history on further evaluation reveal little in the way of risk factors. Subsequent CT chest, abdomen, pelvis with 3cm R Suprahilar pulmonary nodule and multiple smaller nodules. Neurosurgery took her to the OR on 9/23, and biopsied as well as performed laminectomy for decompression. - PT recommendations SNF with 24hour supervision. Social work on Mining engineer  - Automotive engineer for PT with Neurosurgery. Light PT at this point. Pt. Not to get UOB per Dr. Ellene Route - Decadron 40m q6hr. Taper  per neurosurgery.  - Large thyroid goiter noted. TSH normal, but likely needs ultrasound, ? Biopsy. Won't be able to get uptake scan due to recent contrast load due to MRI.  - Waiting for surgical pathology in order to direct further workup and discussion regarding this patient. Oncology to see once pathology returns.  - flexeril, oxycodone for back pain, though currently controlled on Tylenol.  - foley catheter in place for urinary retention - Fecal retention - enema performed yesterday, and patient reports some relief.  - TSH, B12 Level, Mg, Phos - all relatively normal  - Random cortisol - Nl.  - ESR elevated, CRP elevted nonspecific but now concerning given new lesion on spine, and potential for mets.  - No return of motor function at this time, and minimal improvement in sensory function. Discussed reimaging with Dr.  EEllene Route He states that there is no indication for repeat imaging at this time.  - Overall this indicates a likely very poor prognosis. Most likely mets given presentation. Preliminary pathology (verbally communicated on 9/25) suggestive of possible thyroid primary but final stains pending. Will pursue further workup once we have more information, and pending discussion with oncology and neurosurgery.   #Pulmonary Embolus - Found on CT Chest in R middle and R lower lobe pulmonary arteries. Pt. With brief episodes of tachycardia, but otherwise asymptomatic with O2 sats 100% on 2L [place]. Pt. Without chest pain at this time. Blood pressures stable. Lower extremity Venous Duplex negative for DVT.  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 ESR elevated.  - Now on IV heparin (waited 48 hrs post surgery) - Will continue to monitor VS. Stable at this  time. - Pending discussion with oncology, but will probably need transition to Coumadin for long term treatment of PE given hypercoagulability in the setting of what is likely to be cancer. Will anticipate this for now, but discuss further  once pathology results return.   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 PCOS vs. Adrenal pathology?[name] Kitchen Would be unlike to be first presentation of HPA pathology; no evidence of salt wasting on labs. Hypertensive and not hypotensive. Testing during this hospitalization after having recently received steroids would be largely non-diagnostic. Though, could also be a combination of isolated processes i.e. Clitoromegaly, Hirsutism 2/2 obesity. Will need ongoing outpatient evaluation.  - Recent thought of outpt vs inpt w/up including [place] to assess ovaries vs 21-hydroxylase/ACTH/cortisol.  - TSH, Cortisol WNL - A1C : 6.7 - consistent with PCOS given evidence of cystic structures in pelvis.   #HTN- BP largely uncontrolled 150s-180s/70s-90s; previously on BP med that made her feel badly? Unclear what kind  -started with losartan 12.109m at last admission.  -continue losartan here.  - Blood pressure well controlled here.    #Obesity-  - consistent with potential PCOS.   FEN/GI: Reg diet; KVO; s/p Enema with some relief Prophylaxis: on full dose IV heparin  Disposition: Neurosurgery and Oncology on board. Will continue work up as indicated. PT today. Oncology discussion pending pathology results. Anticipate D/C to SNF    Subjective:  Pt. With no complaints this am. She says that pain is well controlled with tylenol. She reports relief after enema yesterday. She says that she has had no improvement in her lower extremity motor function, but some return of sensation.   Objective: Temp:  [97.8 F (36.6 C)-98.4 F (36.9 C)] 97.8 F (36.6 C) (09/27 1012) Pulse Rate:  [66-83] 67 (09/27 1012) Resp:  [14-18] 18 (09/27 1012) BP: (125-151)/(48-83) 137/63 mmHg (09/27 1012) SpO2:  [97 %-98 %] 98 % (09/27 1012) Physical Exam: General: NAD, AAOx3, morbidly obese  HEENT: R  Sided Thyromegaly / Goiter noted on exam without tenderness,  and no nodules, no LAD, neck supple  Abdomen: Soft and nontender, nondistended, No organomegaly though exam limited by body habitus. Extremities: no edema 2+ distal pulses bilaterally.  Skin: No lesions or rashes noted  Neuro: cranial nerves grossly normal. Normal speech, minimal ability 1/5  Plantarflexion of bilat feet. full sensation to light touch, no sensory deficits. + babinski bilaterally, and hyperreflexivity noted.   Laboratory:  Recent Labs Lab 12/16/13 1025 12/17/13 0324 12/18/13 0635  WBC 12.3* 12.0* 11.8*  HGB 10.4* 10.6* 10.9*  HCT 31.1* 31.2* 32.2*  PLT 239 268 264    Recent Labs Lab 12/13/13 1055 12/15/13 0314 12/17/13 0324 12/18/13 0635  NA 138 136* 136* 137  K 3.7 4.5 4.7 4.7  CL 96 97 98 97  CO2 29 24 25 27   BUN 14 18 21 21   CREATININE 0.80 1.08 0.78 0.85  CALCIUM 8.8 8.5 8.8 8.8  PROT  --  7.0  --   --   BILITOT  --  1.4*  --   --   ALKPHOS  --  118*  --   --   ALT  --  19  --   --   AST  --  30  --   --   GLUCOSE 121* 216* 191* 199*   Urinalysis    Component Value Date/Time   COLORURINE YELLOW 12/10/2013 1036   APPEARANCEUR CLOUDY* 12/10/2013 1036  LABSPEC 1.022 12/10/2013 1036   PHURINE 5.5 12/10/2013 1036   GLUCOSEU NEGATIVE 12/10/2013 1036   HGBUR NEGATIVE 12/10/2013 1036   BILIRUBINUR NEGATIVE 12/10/2013 1036   KETONESUR NEGATIVE 12/10/2013 1036   PROTEINUR NEGATIVE 12/10/2013 1036   UROBILINOGEN 1.0 12/10/2013 1036   NITRITE NEGATIVE 12/10/2013 1036   LEUKOCYTESUR NEGATIVE 12/10/2013 1036    Phos - 3.9 Mg - 2.2 CRP - 11.6 ESR - 59 CK - 56 B12 - 209 Random Cortisol - 25.4 A1C - 6.7 TSH - 2.13 RPR - nonreactive HIV - nonreactive UPT - Negative  Imaging/Diagnostic Tests:  CT Chest Abd/ Pelvis 9/23:  CLINICAL DATA: Destructive metastatic lesion of the T3 vertebral  body with no known primary carcinoma.  EXAM:  CT CHEST, ABDOMEN, AND PELVIS WITH CONTRAST  TECHNIQUE:  Multidetector CT imaging of the chest, abdomen and pelvis was   performed following the standard protocol during bolus  administration of intravenous contrast.  CONTRAST: 154m OMNIPAQUE IOHEXOL 300 MG/ML SOLN  COMPARISON: MRI of the cervical spine earlier today.  FINDINGS:  CT CHEST FINDINGS  Within the chest, a right suprahilar mass measures approximately 3.0  x 2.7 x 2.2 cm. This either represents a nodal metastasis or central  lung mass such as small cell carcinoma. A number of smaller  mediastinal lymph nodes are also identified in the right  paratracheal region, prevascular space and AP window.  Destructive lesion of the T3 vertebral body noted which nearly  replaces the entire vertebral body with tumor extension posteriorly  into the epidural space causing significant compression of the  spinal cord. Paravertebral extension of tumor is also suspected  lateral and anterior to the vertebral body, right greater than left.  Massive enlargement of the thyroid gland present with transverse  dimensions of approximately 7.7 x 8.5 cm. The thyroid gland is  incompletely visualized as the neck was not included. Central  dystrophic calcification identified. The enlarged thyroid gland does  cause significant mass effect on the airway with evidence of  tracheal deviation to the right and tracheal narrowing at the  thoracic inlet. The enlarged thyroid gland extends into the superior  mediastinum.  There is evidence of pulmonary embolism with nonocclusive thrombus  identified in the right middle lobe and lower lobe pulmonary  arteries. Pulmonary arterial opacification is not optimal on the  standard venous phase evaluation of the chest but the right sided  pulmonary embolism is clearly present.  There are small bilateral pulmonary nodules. Right-sided pulmonary  nodule measuring 6 mm likely localizes to the very top of the lower  lobe near the major fissure. Left upper lobe nodule measures 7 mm.  Lingular nodule measures 7 mm. Left lower lobe nodule  measures 7 mm.  Faint peripheral right middle lobe nodule measures 3 mm. Inferior  right middle lobe nodule measures 6 mm. Several small nodules in the  posterior right upper lobe identified in the 3-4 mm range.  No evidence of pleural or pericardial fluid. No other bony lesions  identified in the thorax.  CT ABDOMEN AND PELVIS FINDINGS  Small low-density lesion in the left lobe of the liver measures 7 mm  and likely represents a cyst. No other hepatic abnormalities. The  gallbladder, pancreas, spleen, adrenal glands and kidneys are  unremarkable. No adenopathy is identified in the abdomen or pelvis.  Bowel shows normal caliber without evidence of obstruction, lesion  or inflammation. No abnormal fluid collections.  The bladder is moderately distended. Elongated cystic changes in  both adnexal  regions identified which may represent a combination of  ovarian cysts and potentially hydrosalpinx. Correlation with pelvic  ultrasound is recommended. No hernias are identified. The uterus  appears unremarkable by CT. No bony lesions are identified in the  abdomen or pelvis.  IMPRESSION:  1. 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  2. 3 cm right suprahilar mass may represent nodal metastasis versus  primary central tumor. Next number multiple small bilateral  pulmonary nodules consistent with metastatic disease.  3. Acute nonocclusive pulmonary embolism in the right middle lobe  and right lower lobe pulmonary arteries.  4. Massive thyroid goiter causing tracheal deviation to the right  and narrowing of the trachea.  5. Tubular cystic abnormalities of both adnexal regions which may be  a combination of cystic changes in the ovaries and potentially  hydrosalpinx. Correlation recommended with pelvic ultrasound to  exclude any suspicious cystic lesions of the adnexae.   6. Critical Value/emergent results were called by telephone at the  time of interpretation on 12/14/2013 at 4:52 pm to Dr. Tamala Julian of the  [facility] Medicine Teaching Service, who verbally acknowledged these  results.  Electronically Signed  By: Aletta Edouard M.D.  On: 12/14/2013 16:53   MRI T-spine 9/23:  CLINICAL DATA: Lower extremity weakness. Worsening back pain.  Compressive upper thoracic mass partially visualized on cervical  spine MRI as well as chest CT with spinal cord compression.  EXAM:  MRI THORACIC SPINE WITHOUT CONTRAST  TECHNIQUE:  Multiplanar, multisequence MR imaging of the thoracic spine was  performed. No intravenous contrast was administered.  COMPARISON: Cervical spine MRI and chest CT earlier today  FINDINGS:  There is a T3 compression fracture with approximately 50% height  loss centrally. As described on cervical spine MRI and chest CT  earlier today, there is abnormal soft tissue replacing the majority  of the T3 vertebral body extending into the pedicles as well as into  the ventral epidural space with moderate cord compression. Epidural  extension is slightly greater on the right and extends into the  inferior aspect of the right T2-3 neural foramen as well as superior  aspect of the right T3-4 neural foramen. There is also abnormal  paravertebral soft tissue laterally and anteriorly as seen on CT.  Central ventral epidural material posterior to the T4 vertebral body  may represent tumor or hematoma. Mild T2 hyperintensity is present  in the spinal cord at the T3 level extending inferiorly to the T4-5  disc space level consistent with mild edema. Mild edema is present  in the posterior paraspinal soft tissues of the upper thoracic  spine. No other vertebral body lesions are identified.  There is a moderate-sized central disc extrusion at T7-8 which  contacts and mildly flattens the ventral spinal cord with mild  spinal stenosis. A small left central disc  protrusion is present at  T8-9 with at most minimal impression on the spinal cord. Massive  enlargement of the left thyroid lobe is partially visualized.  Mediastinal and lung findings are better demonstrated on recent  chest CT.  IMPRESSION:  1. T3 vertebral body mass with pathologic compression fracture and  epidural tumor resulting in moderate cord compression with evidence  of mild spinal cord edema.  2. T7-8 disc extrusion resulting in mild spinal stenosis.   MRI Head / C-Spine 9/23:  CLINICAL DATA: 48 year old female who presented with back pain and  lower extremity numbness, with continued inadequately explained  lower extremity weakness following Hospital discharge on 12/12/2013.  Initial encounter.  EXAM:  MRI HEAD WITHOUT AND WITH CONTRAST  MRI CERVICAL SPINE WITHOUT AND WITH CONTRAST  TECHNIQUE:  Multiplanar, multiecho pulse sequences of the brain and surrounding  structures, and cervical spine, to include the craniocervical  junction and cervicothoracic junction, were obtained without and  with intravenous contrast.  CONTRAST: 20 mL MultiHance.  COMPARISON: Lumbar MRI 12/10/2013.  FINDINGS:  MRI HEAD FINDINGS  Cerebral volume is normal. No restricted diffusion to suggest acute  infarction. No midline shift, mass effect, evidence of mass lesion,  ventriculomegaly, extra-axial collection or acute intracranial  hemorrhage. Cervicomedullary junction and pituitary are within  normal limits. Major intracranial vascular flow voids are preserved.  Pearline Cables and white matter signal is within normal limits for age  throughout the brain. No abnormal enhancement identified, pulsation  artifact prominent on axial post-contrast images.  Large body habitus. Small right frontal scalp lipoma (series 10,  image 37). Visible internal auditory structures appear normal. Mild  right mastoid fluid. Minor paranasal sinus mucosal thickening.  Visualized orbit soft tissues are within normal  limits. Partially  visible thyromegaly greater on the left, see below. Normal bone  marrow signal.  MRI CERVICAL SPINE FINDINGS  Large body habitus resulting in wrap artifact on axial images.  However, there is massive thyroid enlargement evident, with the left  lobe measuring at least 7.2 x 5.2 x 11.2 cm (AP by transverse by  CC). The trachea is displaced to the right at the thoracic inlet and  mildly compressed. There is other regional mass effect including  sheath. On the left carotid  Fairly preserved cervical lordosis. No marrow edema or evidence of  acute osseous abnormality. Cervicomedullary junction is within  normal limits. Spinal cord signal is within normal limits at all  visualized levels. No abnormal intradural enhancement.  C2-C3: Negative.  C3-C4: Disc osteophyte complex, facet and ligament flavum  hypertrophy. Along with a degree of congenital canal narrowing,  there is mild spinal stenosis and moderate biforaminal stenosis.  C4-C5: Mild uncovertebral and facet hypertrophy. Mild right C5  foraminal stenosis.  C5-C6: Circumferential disc osteophyte complex. Along with a degree  of congenital canal narrowing there is mild spinal stenosis.  Moderate right C6 foraminal stenosis.  C6-C7: Negative except for mild facet hypertrophy.  C7-T1: Negative.  T1-T2: Negative.  Faintly visualized at the T3 level in the upper thoracic spine there  is a lobulated solidly enhancing mass which appears to extend into  the epidural space from the T3 vertebral body. This is inseparable  from the spinal cord which is likely compressed. See series 2000  image 7 and series 1500, image 6. This is not included on axial  images.  IMPRESSION:  1. Faintly visible but aggressive appearing enhancing mass at the T3  level of the upper thoracic spine, likely with spinal cord  compression evaluate further are with thoracic MRI without and with  contrast.  2. Normal for age MRI appearance of the  brain.  3. Massive thyroid enlargement, greater on the left. Left thyroid  goiter/mass measuring at least 7.2 x 5.2 x 11.2 cm. Mass effect at  the thoracic inlet including on the trachea.  4. Mild chronic disc and endplate degeneration in the cervical spine  at C3-C4 and C5-C6 resulting in borderline to mild spinal stenosis,  but no cervical spinal cord mass effect or signal abnormality.  Electronically Signed:  By: Lars Pinks  M.D.  On: 12/14/2013 09:50   MRI L-Spine 9/19:  CLINICAL DATA: Back pain with bilateral leg numbness, worsening  with oral steroids.  EXAM:  MRI LUMBAR SPINE WITHOUT CONTRAST  TECHNIQUE:  Multiplanar, multisequence MR imaging of the lumbar spine was  performed. No intravenous contrast was administered.  COMPARISON: CT 12/09/2013  FINDINGS:  There is no degenerative disease at L2-3 or above. The discs are  normal in signal characteristics and morphology. The spinal canal  and foramina are widely patent. The distal cord and conus are normal  with conus tip at L1. There are small benign and insignificant  hemangiomas within the L2 and L3 vertebral bodies.  L3-4: Minimal desiccation of the disc. Mild circumferential bulging  slightly more prominent towards the left. Bilateral facet  degeneration and hypertrophy. Mild narrowing of the lateral recesses  without apparent neural compression. Mild foraminal narrowing on the  left without apparent neural compression.  L4-5: Desiccation of the disc with circumferential protrusion.  Bilateral facet and ligamentous hypertrophy. Stenosis of both  lateral recesses without definite focal neural compression. Mild  foraminal narrowing bilaterally.  L5-S1: Desiccation of the disc with loss of height. Endplate  osteophytes and bulging of the disc more prominent towards the left.  Bilateral facet degeneration and hypertrophy left more than right.  Stenosis of the subarticular lateral recess on the left that could  compress the  S1 nerve root. Mild foraminal narrowing left more than  right but without apparent compression of the exiting L5 nerve  roots.  Serpiginous fluid-filled structure in the left pelvis could  represent hydrosalpinx. This is not primarily or completely imaged.  IMPRESSION:  Degenerative disc disease and degenerative facet disease in the  lower lumbar spine that could be a cause of lumbago.  Lateral recess and foraminal narrowing at L3-4, L4-5 and L5-S1 as  described above that could be a cause of focal neural compression.  In general, this is more pronounced on the left than on the right.  In particular, there is potential for left S1 nerve root compression  in the subarticular lateral recess at L5-S1.  Electronically Signed  By: Nelson Chimes M.D.  On: 12/10/2013 08:45   CT L-Spine 9/18:  CLINICAL DATA: Low back pain with difficulty walking.  EXAM:  CT LUMBAR SPINE WITHOUT CONTRAST  TECHNIQUE:  Multidetector CT imaging of the lumbar spine was performed without  intravenous contrast administration. Multiplanar CT image  reconstructions were also generated.  COMPARISON: None.  FINDINGS:  There is no fracture or spondylolisthesis. There is moderately  severe disc space narrowing at L5-S1 with slight vacuum phenomenon  at this level. There is moderate narrowing at L4-5. There are  prominent anterior osteophytes at L2, L3, L4, and L5.  At T12-L1, there is moderate facet hypertrophy bilaterally. There is  no nerve root edema or effacement. No disc extrusion or stenosis.  At L1-2, there is moderate facet hypertrophy bilaterally. There is  mild diffuse disc bulging. There is no nerve root edema or  effacement. No disc extrusion or stenosis.  At L2-3, there is broad-based disc bulging and moderate facet  hypertrophy bilaterally. There is borderline narrowing of the thecal  sac with borderline spinal stenosis. No disc extrusion.  At L3-4, there is severe facet osteoarthritic change  bilaterally,  more pronounced on the left than on the right. There is broad-based  disc bulging and moderate ligamentum flavum hypertrophy. These  findings in concert lead to moderate generalized spinal stenosis.  At L4-5, there is broad-based  disc protrusion and severe facet  osteoarthritic change bilaterally. These findings in concert lead to  moderate generalized spinal stenosis. No frank disc extrusion.  At L5-S1, there is severe facet osteoarthritic change on the left  and moderate facet osteoarthritic change on the right. There is  broad-based disc protrusion. There is exit foraminal narrowing  bilaterally, more severe on the left than on the right, due to  osteophytic change. No frank disc extrusion or stenosis is seen at  this level.  There are no paraspinous lesions.  IMPRESSION:  Spinal stenosis at L3-4 and L4-5, multifactorial. Borderline spinal  stenosis at L2-3. There does appear to be a degree of congenital  narrowing of the canal which contributes to spinal stenosis.  Multilevel arthropathy.  No frank disc extrusion.  No fracture or spondylolisthesis.  Electronically Signed  By: Lowella Grip M.D.  On: 12/09/2013 13:50   CXR 9/13:  CLINICAL DATA: Upper back pain for 5 weeks. Dyspnea on exertion.  Nonsmoker. History of hypertension.  EXAM:  CHEST 2 VIEW  COMPARISON: 04/15/2006  FINDINGS:  The heart is mildly enlarged. There is elevation of right  hemidiaphragm, stable in appearance. There is streaky density  favored to be within the right lower lobe, consistent with  atelectasis or infiltrate. No pulmonary edema. No pleural effusions.  There are moderate degenerative changes in the thoracic spine.  IMPRESSION:  Right lower lobe atelectasis or infiltrate.  Electronically Signed  By: Shon Hale M.D.  On: 12/04/2013 19:26  Venous Duplex 9/24:  Summary:  - No evidence of deep vein thrombosis involving the right lower extremity. - No evidence of deep vein  thrombosis involving the left lower extremity.   Aquilla Hacker, MD 12/18/2013, 10:40 AM PGY-3, [facility] Intern pager: [phone], text pages welcome

## 2013-12-18 NOTE — Progress Notes (Signed)
Patient ID: Sarah [name], female   DOB: 04/17/1965, 48 y.o.   MRN: [id number] Vital signs are stable Motor function lower extremity very minimal Spinal reflexivity noted Sensation appears more intact on left than on right. No new changes.

## 2013-12-18 NOTE — Progress Notes (Signed)
ANTICOAGULATION CONSULT NOTE - Follow Up Consult  Pharmacy Consult:  Heparin Indication: pulmonary embolus  Allergies  Allergen Reactions  . Tramadol Nausea And Vomiting    Patient Measurements: Height: 5\' 11"  (180.3 cm) Weight: 313 lb 15 oz (142.4 kg) IBW/kg (Calculated) : 70.8 Heparin Dosing Weight: 104 kg  Vital Signs: Temp: 97.8 F (36.6 C) (09/27 1012) Temp src: Oral (09/27 1012) BP: 137/63 mmHg (09/27 1012) Pulse Rate: 67 (09/27 1012)  Labs:  Recent Labs  12/16/13 1025 12/17/13 0324 12/17/13 0910 12/17/13 1830 12/18/13 0635  HGB 10.4* 10.6*  --   --  10.9*  HCT 31.1* 31.2*  --   --  32.2*  PLT 239 268  --   --  264  HEPARINUNFRC  --   --  0.88* 0.70 0.58  CREATININE  --  0.78  --   --  0.85    Estimated Creatinine Clearance: 128.4 ml/min (by C-G formula based on Cr of 0.85).     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 She had surgery on 12/15/13 for decompression.  She was also found to have a PE on CT chest started on IV heparin 48 hours post-operatively.  Heparin level is therapeutic; no bleeding reported.   Goal of Therapy:  Heparin level 0.3-0.7 units/ml Monitor platelets by anticoagulation protocol: Yes    Plan:  - Continue heparin gtt at 1500 units/hr - Daily HL / CBC - F/U oral AC when possible, biopsy result - Watch CBGs.  Consider starting SSI.   Sarah [name] D. Mina Marble, PharmD, BCPS Pager:  [phone] 12/18/2013, 1:57 PM

## 2013-12-19 ENCOUNTER — Inpatient Hospital Stay: Payer: Self-pay | Admitting: Internal Medicine

## 2013-12-19 LAB — CBC WITH DIFFERENTIAL/PLATELET
BASOS ABS: 0 10*3/uL (ref 0.0–0.1)
Basophils Relative: 0 % (ref 0–1)
Eosinophils Absolute: 0 10*3/uL (ref 0.0–0.7)
Eosinophils Relative: 0 % (ref 0–5)
HEMATOCRIT: 35.4 % — AB (ref 36.0–46.0)
HEMOGLOBIN: 12.1 g/dL (ref 12.0–15.0)
Lymphocytes Relative: 13 % (ref 12–46)
Lymphs Abs: 1.9 10*3/uL (ref 0.7–4.0)
MCH: 29.2 pg (ref 26.0–34.0)
MCHC: 34.2 g/dL (ref 30.0–36.0)
MCV: 85.3 fL (ref 78.0–100.0)
MONOS PCT: 7 % (ref 3–12)
Monocytes Absolute: 1 10*3/uL (ref 0.1–1.0)
NEUTROS ABS: 11.4 10*3/uL — AB (ref 1.7–7.7)
Neutrophils Relative %: 80 % — ABNORMAL HIGH (ref 43–77)
Platelets: 265 10*3/uL (ref 150–400)
RBC: 4.15 MIL/uL (ref 3.87–5.11)
RDW: 13.6 % (ref 11.5–15.5)
WBC: 14.3 10*3/uL — AB (ref 4.0–10.5)

## 2013-12-19 LAB — GLUCOSE, CAPILLARY
GLUCOSE-CAPILLARY: 319 mg/dL — AB (ref 70–99)
Glucose-Capillary: 311 mg/dL — ABNORMAL HIGH (ref 70–99)
Glucose-Capillary: 219 mg/dL — ABNORMAL HIGH (ref 70–99)
Glucose-Capillary: 260 mg/dL — ABNORMAL HIGH (ref 70–99)
Glucose-Capillary: 308 mg/dL — ABNORMAL HIGH (ref 70–99)

## 2013-12-19 LAB — BASIC METABOLIC PANEL
ANION GAP: 12 (ref 5–15)
BUN: 23 mg/dL (ref 6–23)
CHLORIDE: 93 meq/L — AB (ref 96–112)
CO2: 30 meq/L (ref 19–32)
Calcium: 9.1 mg/dL (ref 8.4–10.5)
Creatinine, Ser: 0.87 mg/dL (ref 0.50–1.10)
GFR calc Af Amer: >90 mL/min (ref >90)
GFR calc non Af Amer: 78 mL/min — ABNORMAL LOW (ref >90)
GLUCOSE: 226 mg/dL — AB (ref 70–99)
Potassium: 5 mEq/L (ref 3.7–5.3)
SODIUM: 135 meq/L — AB (ref 137–147)

## 2013-12-19 LAB — HEPARIN LEVEL (UNFRACTIONATED): Heparin Unfractionated: 0.62 IU/mL (ref 0.30–0.70)

## 2013-12-19 MED ORDER — INSULIN ASPART 100 UNIT/ML ~~LOC~~ SOLN
0.0000 [IU] | Freq: Three times a day (TID) | SUBCUTANEOUS | Status: DC
  Administered 2013-12-19: 7 [IU] via SUBCUTANEOUS
  Administered 2013-12-19: 5 [IU] via SUBCUTANEOUS
  Administered 2013-12-20: 3 [IU] via SUBCUTANEOUS

## 2013-12-19 NOTE — Clinical Social Work Note (Signed)
CSW reviewed chart. CSW contact Jeneen Rinks and Gerald Stabs at the [facility] to confirm the bed offered. CSW is awaiting for Jeneen Rinks to call back with an answer.   [place], MSW, [place]

## 2013-12-19 NOTE — Progress Notes (Signed)
FMTS Attending Note Patient seen and examined by me, discussed with resident team and I agree with Dr Melancon's note for today.  Jahid Weida, MD 

## 2013-12-19 NOTE — Evaluation (Signed)
Physical Therapy Evaluation Patient Details Name: Sarah [name] MRN: [id number] DOB: [birth year] Today's Date: 12/19/2013   History of Present Illness  48 y.o. female admitted to [facility] on 12/12/13 with progressive lower extremity weakness and mid to upper back pain.  Pt was found to have a mass at T3 with (+) spinal cord compression.  On 9/23 pt underwent surgery for decompression and biopsy.  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  Her thyroid is very enlarged.  Oncology has been consulted and are awaiting pathology results.  Pt with significant PMHx of HTN and chronic back pain.  Clinical Impression  Pt is hopeful for some recovery of her legs. Increased tone in right leg with low tone in left leg.  Pt is limited right now to in-bed activities and mobility per neuro.  PT will await clearance to mobilize more aggressively based on their recommendations.      Follow Up Recommendations CIR    Equipment Recommendations  Wheelchair (measurements PT);Wheelchair cushion (measurements PT);Hospital bed;Other (comment) (slide board and possible hoyer lift)    Recommendations for Other Services OT consult;Rehab consult     Precautions / Restrictions Precautions Precautions: Fall Precaution Comments: bil leg weakness, parapelegia      Mobility  Bed Mobility Overal bed mobility: Needs Assistance Bed Mobility: Rolling Rolling: Mod assist         General bed mobility comments: Mod assist to 1/2 roll to position pt for pressure relief. I educated pt that she needs to be in charge of her pressure relief and at most shouldn't go more than 2 hours without rolling to her other side with staff assist for pillow placement.  She also needs her heels floated due to prominant calcaneus bone and risk of pressure sores.   Transfers                 General transfer comment: Pt is restricted to in bed activity only at this time until further clearance from neurosurgery.              Pertinent Vitals/Pain Pain Assessment: No/denies pain Pain Score: 0-No pain    Home Living Family/patient expects to be discharged to:: Private residence Living Arrangements: Other relatives Available Help at Discharge: Family;Available 24 hours/day Type of Home: House Home Access: Stairs to enter Entrance Stairs-Rails: None Entrance Stairs-Number of Steps: 2 (deep enough for all 4 legs of RW) Home Layout: One level Home Equipment: Walker - 2 wheels      Prior Function Level of Independence: Independent         Comments: Pt works at [employer].      Hand Dominance   Dominant Hand: Right    Extremity/Trunk Assessment   Upper Extremity Assessment: Defer to OT evaluation           Lower Extremity Assessment: RLE deficits/detail;LLE deficits/detail RLE Deficits / Details: right leg with significant increase in flexion tone with PROM.  Pt unable to move (except reflexively) her right leg at all.  Decreased sensation in right lower leg and foot.  Pt is able to tell me that I am touching her lower leg, but it is deminished and she cannot distinguish which toes I am touching.  LLE Deficits / Details: left lower extremity is colder to the touch than her right.  She has slightly better sensation and can somewhat generalize where I am touching her on her foot on this side, "I think it is towards my little toe".  She  has no tone over here and is easy to preform PROM.  I did no elicit any active mucle movement, again, only reflexive.   Cervical / Trunk Assessment: Normal  Communication   Communication: No difficulties  Cognition Arousal/Alertness: Awake/alert Behavior During Therapy: WFL for tasks assessed/performed Overall Cognitive Status: Within Functional Limits for tasks assessed                               Assessment/Plan    PT Assessment Patient needs continued PT services  PT Diagnosis Difficulty walking;Abnormality of gait;Generalized  weakness;Acute pain;Other (comment) (parapelegia)   PT Problem List Decreased strength;Decreased range of motion;Decreased activity tolerance;Decreased balance;Decreased mobility;Decreased coordination;Decreased knowledge of use of DME;Decreased knowledge of precautions;Impaired sensation;Impaired tone;Obesity;Pain  PT Treatment Interventions Therapeutic activities;Therapeutic exercise;Neuromuscular re-education;Modalities (will need to be updated once allowed to be EOB/OOB)   PT Goals (Current goals can be found in the Care Plan section) Acute Rehab PT Goals Patient Stated Goal: to get her legs better, figure out a solid dx so that she can get treatment.  PT Goal Formulation: With patient Time For Goal Achievement: 01/02/14 Potential to Achieve Goals: Fair    Frequency Min 3X/week   Barriers to discharge Inaccessible home environment;Decreased caregiver support pt has stairs to enter her home.         End of Session   Activity Tolerance: Patient tolerated treatment well Patient left: in bed;with call bell/phone within reach;with family/visitor present Nurse Communication: Mobility status         Time: 5400-8676 PT Time Calculation (min): 27 min   Charges:   PT Evaluation $Initial PT Evaluation Tier I: 1 Procedure PT Treatments $Therapeutic Exercise: 8-22 mins        Jocob Dambach B. [place], [place], DPT [phone]   12/19/2013, 5:27 PM

## 2013-12-19 NOTE — Progress Notes (Signed)
Family Medicine Teaching Service Daily Progress Note Intern Pager: [phone]  Patient name: Sarah [name] Medical record number: [id number] Date of birth: 1965-06-16 Age: 48 y.o. Gender: female  Primary Care Provider: No PCP Per Patient Consultants: None Code Status: Full  Pt Overview and Major Events to Date:  - 9/22: Pt. Admitted with lower extremity weakness.  - 9/23: Pt. Found to have mass at T-3 level with spinal compression.  - 9/24: Surgery performed for decompression and biopsy overnight. Pt. Post-op and doing well. 3cm Suprahilar mass on CT Chest, PE also identified. ?Thyroid etiology, but much less likely for mets. Oncology consulted.  - 9/25 - 9/28: Awaiting pathology results with plan for oncology to see once path returns. Pt. With little neurological improvement. Begin light PT per our discussion with neurosurgery.   Assessment and Plan: Sarah [name] is a 48 y.o. female presenting with continued lower extremity weakness at baseline after discharge from hospital on 9/21. PMH is significant for obesity, HTN, and ?History of Hypothyroidism previously on Synthroid per patient.   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 Considering Mets vs. Primary tumor. Pulmonary lesion noted on CT as well as Large thyroid mass ? Goiter. Family history and social history on further evaluation reveal little in the way of risk factors. Subsequent CT chest, abdomen, pelvis with 3cm R Suprahilar pulmonary nodule and multiple smaller nodules. Neurosurgery took her to the OR on 9/23, and biopsied as well as performed laminectomy for decompression. - PT recommendations SNF with 24hour supervision. Social work on Mining engineer  - Automotive engineer for PT with Neurosurgery. Light PT at this point. Pt. Not to get UOB per Dr. Ellene Route - Decadron 66m q6hr. Taper  per neurosurgery.  - Large thyroid goiter noted. TSH normal, but likely needs ultrasound, ? Biopsy. Won't be able to get uptake scan due to recent contrast load due to MRI.  - Waiting for surgical pathology in order to direct further workup and discussion regarding this patient. Oncology to see once pathology returns.  - flexeril, oxycodone for back pain, though currently controlled on Tylenol.  - foley catheter in place for urinary retention  - Fecal retention - enema performed yesterday, and patient reports some relief.  - TSH, B12 Level, Mg, Phos - all relatively normal  - Random cortisol - Nl.  - ESR elevated, CRP elevted nonspecific but now concerning given new lesion on spine, and potential for mets.  - Very Little return of motor function at this time, and minimal improvement in sensory function. Discussed reimaging with Dr.  EEllene Routeyesterday. He states that there is no indication for repeat imaging at this time.  - Overall this indicates a likely very poor prognosis. Most likely mets given presentation. Preliminary pathology (verbally communicated on 9/25) suggestive of possible thyroid primary but final stains pending. Will pursue further workup once we have more information, and pending discussion with oncology and neurosurgery.   #Pulmonary Embolus - Found on CT Chest in R middle and R lower lobe pulmonary arteries. Pt. With brief episodes of tachycardia, but otherwise asymptomatic with O2 sats 100% on 2L [place]. Pt. Without chest pain at this time. Blood pressures stable. Lower extremity Venous Duplex negative for DVT.  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 ESR elevated.  - Now on IV heparin (waited 48 hrs post surgery) - Will continue to monitor VS.  Stable at this time. - Pending discussion with oncology, but will probably need transition to Coumadin for long term treatment of PE given hypercoagulability in the setting of what is likely to be cancer. Will anticipate this for now,  but discuss further once pathology results return.   #Hyperglycemia -  Likely diabetic with PCOS given A1C of 6.7. CBG's 200's - 300.  - Start on Sensitive SSI for glucose control in the hospital.  - Continue to monitor.   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 PCOS vs. Adrenal pathology?[name] Kitchen Would be unlike to be first presentation of HPA pathology; no evidence of salt wasting on labs. Hypertensive and not hypotensive. Testing during this hospitalization after having recently received steroids would be largely non-diagnostic. Though, could also be a combination of isolated processes i.e. Clitoromegaly, Hirsutism 2/2 obesity. Will need ongoing outpatient evaluation.  - Recent thought of outpt vs inpt w/up including [place] to assess ovaries vs 21-hydroxylase/ACTH/cortisol.  - TSH, Cortisol WNL - A1C : 6.7 - consistent with PCOS given evidence of cystic structures in pelvis.   #HTN- BP largely uncontrolled 150s-180s/70s-90s; previously on BP med that made her feel badly? Unclear what kind  -started with losartan 12.22m at last admission.  -continue losartan here.  - Blood pressure well controlled here.    #Obesity-  - consistent with potential PCOS.   FEN/GI: Reg diet; KVO; s/p Enema with some relief Prophylaxis: on full dose IV heparin  Disposition: Neurosurgery and Oncology on board. Will continue work up as indicated. PT today. Oncology discussion pending pathology results. Goals of care discussion to determine dispo.   Subjective:  Pt. States she had a good night last night. She reports some positional back pain, but no constant pain, and it is better from before her surgery. She says that she has not had a BM since yesterday. She is tolerating po well. She has no complaints otherwise this am.    Objective: Temp:  [97.8 F (36.6 C)-98.1 F (36.7 C)] 98.1 F (36.7 C) (09/28 0559) Pulse Rate:  [63-81] 63 (09/28  0559) Resp:  [17-18] 18 (09/28 0559) BP: (130-155)/(59-91) 130/65 mmHg (09/28 0559) SpO2:  [97 %-99 %] 97 % (09/28 0559) Physical Exam: General: NAD, AAOx3, morbidly obese  HEENT: R  Sided Thyromegaly / Goiter noted on exam without tenderness, and no nodules, no LAD, neck supple  Abdomen: Soft and nontender, mildly distended, No organomegaly though exam limited by body habitus Extremities: no edema 2+ distal pulses bilaterally.  Skin: No lesions or rashes notedj, incision over T-spine C/D/I Neuro: cranial nerves grossly normal. Normal speech, minimal ability 1/5  Plantarflexion of R foot. full sensation to light touch, no sensory deficits. + babinski bilaterally, and hyperreflexivity noted.   Laboratory:  Recent Labs Lab 12/17/13 0324 12/18/13 0635 12/19/13 0709  WBC 12.0* 11.8* 14.3*  HGB 10.6* 10.9* 12.1  HCT 31.2* 32.2* 35.4*  PLT 268 264 265    Recent Labs Lab 12/13/13 1055 12/15/13 0314 12/17/13 0324 12/18/13 0635 12/19/13 0709  NA 138 136* 136* 137 135*  K 3.7 4.5 4.7 4.7 5.0  CL 96 97 98 97 93*  CO2 29 24 25 27 30   BUN 14 18 21 21 23   CREATININE 0.80 1.08 0.78 0.85 0.87  CALCIUM 8.8 8.5 8.8 8.8 9.1  PROT  --  7.0  --   --   --   BILITOT  --  1.4*  --   --   --  ALKPHOS  --  118*  --   --   --   ALT  --  19  --   --   --   AST  --  30  --   --   --   GLUCOSE 121* 216* 191* 199* 226*   Urinalysis    Component Value Date/Time   COLORURINE YELLOW 12/10/2013 1036   APPEARANCEUR CLOUDY* 12/10/2013 1036   LABSPEC 1.022 12/10/2013 1036   PHURINE 5.5 12/10/2013 1036   GLUCOSEU NEGATIVE 12/10/2013 1036   HGBUR NEGATIVE 12/10/2013 1036   BILIRUBINUR NEGATIVE 12/10/2013 1036   KETONESUR NEGATIVE 12/10/2013 1036   PROTEINUR NEGATIVE 12/10/2013 1036   UROBILINOGEN 1.0 12/10/2013 1036   NITRITE NEGATIVE 12/10/2013 1036   LEUKOCYTESUR NEGATIVE 12/10/2013 1036    Phos - 3.9 Mg - 2.2 CRP - 11.6 ESR - 59 CK - 56 B12 - 209 Random Cortisol - 25.4 A1C - 6.7 TSH -  2.13 RPR - nonreactive HIV - nonreactive UPT - Negative  Imaging/Diagnostic Tests:  CT Chest Abd/ Pelvis 9/23:  CLINICAL DATA: Destructive metastatic lesion of the T3 vertebral  body with no known primary carcinoma.  EXAM:  CT CHEST, ABDOMEN, AND PELVIS WITH CONTRAST  TECHNIQUE:  Multidetector CT imaging of the chest, abdomen and pelvis was  performed following the standard protocol during bolus  administration of intravenous contrast.  CONTRAST: 131m OMNIPAQUE IOHEXOL 300 MG/ML SOLN  COMPARISON: MRI of the cervical spine earlier today.  FINDINGS:  CT CHEST FINDINGS  Within the chest, a right suprahilar mass measures approximately 3.0  x 2.7 x 2.2 cm. This either represents a nodal metastasis or central  lung mass such as small cell carcinoma. A number of smaller  mediastinal lymph nodes are also identified in the right  paratracheal region, prevascular space and AP window.  Destructive lesion of the T3 vertebral body noted which nearly  replaces the entire vertebral body with tumor extension posteriorly  into the epidural space causing significant compression of the  spinal cord. Paravertebral extension of tumor is also suspected  lateral and anterior to the vertebral body, right greater than left.  Massive enlargement of the thyroid gland present with transverse  dimensions of approximately 7.7 x 8.5 cm. The thyroid gland is  incompletely visualized as the neck was not included. Central  dystrophic calcification identified. The enlarged thyroid gland does  cause significant mass effect on the airway with evidence of  tracheal deviation to the right and tracheal narrowing at the  thoracic inlet. The enlarged thyroid gland extends into the superior  mediastinum.  There is evidence of pulmonary embolism with nonocclusive thrombus  identified in the right middle lobe and lower lobe pulmonary  arteries. Pulmonary arterial opacification is not optimal on the  standard venous  phase evaluation of the chest but the right sided  pulmonary embolism is clearly present.  There are small bilateral pulmonary nodules. Right-sided pulmonary  nodule measuring 6 mm likely localizes to the very top of the lower  lobe near the major fissure. Left upper lobe nodule measures 7 mm.  Lingular nodule measures 7 mm. Left lower lobe nodule measures 7 mm.  Faint peripheral right middle lobe nodule measures 3 mm. Inferior  right middle lobe nodule measures 6 mm. Several small nodules in the  posterior right upper lobe identified in the 3-4 mm range.  No evidence of pleural or pericardial fluid. No other bony lesions  identified in the thorax.  CT ABDOMEN AND PELVIS FINDINGS  Small low-density lesion in the left lobe of the liver measures 7 mm  and likely represents a cyst. No other hepatic abnormalities. The  gallbladder, pancreas, spleen, adrenal glands and kidneys are  unremarkable. No adenopathy is identified in the abdomen or pelvis.  Bowel shows normal caliber without evidence of obstruction, lesion  or inflammation. No abnormal fluid collections.  The bladder is moderately distended. Elongated cystic changes in  both adnexal regions identified which may represent a combination of  ovarian cysts and potentially hydrosalpinx. Correlation with pelvic  ultrasound is recommended. No hernias are identified. The uterus  appears unremarkable by CT. No bony lesions are identified in the  abdomen or pelvis.  IMPRESSION:  1. 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  2. 3 cm right suprahilar mass may represent nodal metastasis versus  primary central tumor. Next number multiple small bilateral  pulmonary nodules consistent with metastatic disease.  3. Acute nonocclusive pulmonary embolism in the right middle lobe  and right lower lobe pulmonary arteries.   4. Massive thyroid goiter causing tracheal deviation to the right  and narrowing of the trachea.  5. Tubular cystic abnormalities of both adnexal regions which may be  a combination of cystic changes in the ovaries and potentially  hydrosalpinx. Correlation recommended with pelvic ultrasound to  exclude any suspicious cystic lesions of the adnexae.  6. Critical Value/emergent results were called by telephone at the  time of interpretation on 12/14/2013 at 4:52 pm to Dr. Tamala Julian of the  [facility] Medicine Teaching Service, who verbally acknowledged these  results.  Electronically Signed  By: Aletta Edouard M.D.  On: 12/14/2013 16:53   MRI T-spine 9/23:  CLINICAL DATA: Lower extremity weakness. Worsening back pain.  Compressive upper thoracic mass partially visualized on cervical  spine MRI as well as chest CT with spinal cord compression.  EXAM:  MRI THORACIC SPINE WITHOUT CONTRAST  TECHNIQUE:  Multiplanar, multisequence MR imaging of the thoracic spine was  performed. No intravenous contrast was administered.  COMPARISON: Cervical spine MRI and chest CT earlier today  FINDINGS:  There is a T3 compression fracture with approximately 50% height  loss centrally. As described on cervical spine MRI and chest CT  earlier today, there is abnormal soft tissue replacing the majority  of the T3 vertebral body extending into the pedicles as well as into  the ventral epidural space with moderate cord compression. Epidural  extension is slightly greater on the right and extends into the  inferior aspect of the right T2-3 neural foramen as well as superior  aspect of the right T3-4 neural foramen. There is also abnormal  paravertebral soft tissue laterally and anteriorly as seen on CT.  Central ventral epidural material posterior to the T4 vertebral body  may represent tumor or hematoma. Mild T2 hyperintensity is present  in the spinal cord at the T3 level extending inferiorly to the T4-5  disc  space level consistent with mild edema. Mild edema is present  in the posterior paraspinal soft tissues of the upper thoracic  spine. No other vertebral body lesions are identified.  There is a moderate-sized central disc extrusion at T7-8 which  contacts and mildly flattens the ventral spinal cord with mild  spinal stenosis. A small left central disc protrusion is present at  T8-9 with at most minimal impression on the spinal cord. Massive  enlargement of the left thyroid  lobe is partially visualized.  Mediastinal and lung findings are better demonstrated on recent  chest CT.  IMPRESSION:  1. T3 vertebral body mass with pathologic compression fracture and  epidural tumor resulting in moderate cord compression with evidence  of mild spinal cord edema.  2. T7-8 disc extrusion resulting in mild spinal stenosis.   MRI Head / C-Spine 9/23:  CLINICAL DATA: 48 year old female who presented with back pain and  lower extremity numbness, with continued inadequately explained  lower extremity weakness following Hospital discharge on 12/12/2013.  Initial encounter.  EXAM:  MRI HEAD WITHOUT AND WITH CONTRAST  MRI CERVICAL SPINE WITHOUT AND WITH CONTRAST  TECHNIQUE:  Multiplanar, multiecho pulse sequences of the brain and surrounding  structures, and cervical spine, to include the craniocervical  junction and cervicothoracic junction, were obtained without and  with intravenous contrast.  CONTRAST: 20 mL MultiHance.  COMPARISON: Lumbar MRI 12/10/2013.  FINDINGS:  MRI HEAD FINDINGS  Cerebral volume is normal. No restricted diffusion to suggest acute  infarction. No midline shift, mass effect, evidence of mass lesion,  ventriculomegaly, extra-axial collection or acute intracranial  hemorrhage. Cervicomedullary junction and pituitary are within  normal limits. Major intracranial vascular flow voids are preserved.  Pearline Cables and white matter signal is within normal limits for age  throughout the  brain. No abnormal enhancement identified, pulsation  artifact prominent on axial post-contrast images.  Large body habitus. Small right frontal scalp lipoma (series 10,  image 37). Visible internal auditory structures appear normal. Mild  right mastoid fluid. Minor paranasal sinus mucosal thickening.  Visualized orbit soft tissues are within normal limits. Partially  visible thyromegaly greater on the left, see below. Normal bone  marrow signal.  MRI CERVICAL SPINE FINDINGS  Large body habitus resulting in wrap artifact on axial images.  However, there is massive thyroid enlargement evident, with the left  lobe measuring at least 7.2 x 5.2 x 11.2 cm (AP by transverse by  CC). The trachea is displaced to the right at the thoracic inlet and  mildly compressed. There is other regional mass effect including  sheath. On the left carotid  Fairly preserved cervical lordosis. No marrow edema or evidence of  acute osseous abnormality. Cervicomedullary junction is within  normal limits. Spinal cord signal is within normal limits at all  visualized levels. No abnormal intradural enhancement.  C2-C3: Negative.  C3-C4: Disc osteophyte complex, facet and ligament flavum  hypertrophy. Along with a degree of congenital canal narrowing,  there is mild spinal stenosis and moderate biforaminal stenosis.  C4-C5: Mild uncovertebral and facet hypertrophy. Mild right C5  foraminal stenosis.  C5-C6: Circumferential disc osteophyte complex. Along with a degree  of congenital canal narrowing there is mild spinal stenosis.  Moderate right C6 foraminal stenosis.  C6-C7: Negative except for mild facet hypertrophy.  C7-T1: Negative.  T1-T2: Negative.  Faintly visualized at the T3 level in the upper thoracic spine there  is a lobulated solidly enhancing mass which appears to extend into  the epidural space from the T3 vertebral body. This is inseparable  from the spinal cord which is likely compressed. See  series 2000  image 7 and series 1500, image 6. This is not included on axial  images.  IMPRESSION:  1. Faintly visible but aggressive appearing enhancing mass at the T3  level of the upper thoracic spine, likely with spinal cord  compression evaluate further are with thoracic MRI without and with  contrast.  2. Normal for age MRI appearance of the brain.  3. Massive thyroid enlargement, greater on the left. Left thyroid  goiter/mass measuring at least 7.2 x 5.2 x 11.2 cm. Mass effect at  the thoracic inlet including on the trachea.  4. Mild chronic disc and endplate degeneration in the cervical spine  at C3-C4 and C5-C6 resulting in borderline to mild spinal stenosis,  but no cervical spinal cord mass effect or signal abnormality.  Electronically Signed:  By: Lars Pinks M.D.  On: 12/14/2013 09:50   MRI L-Spine 9/19:  CLINICAL DATA: Back pain with bilateral leg numbness, worsening  with oral steroids.  EXAM:  MRI LUMBAR SPINE WITHOUT CONTRAST  TECHNIQUE:  Multiplanar, multisequence MR imaging of the lumbar spine was  performed. No intravenous contrast was administered.  COMPARISON: CT 12/09/2013  FINDINGS:  There is no degenerative disease at L2-3 or above. The discs are  normal in signal characteristics and morphology. The spinal canal  and foramina are widely patent. The distal cord and conus are normal  with conus tip at L1. There are small benign and insignificant  hemangiomas within the L2 and L3 vertebral bodies.  L3-4: Minimal desiccation of the disc. Mild circumferential bulging  slightly more prominent towards the left. Bilateral facet  degeneration and hypertrophy. Mild narrowing of the lateral recesses  without apparent neural compression. Mild foraminal narrowing on the  left without apparent neural compression.  L4-5: Desiccation of the disc with circumferential protrusion.  Bilateral facet and ligamentous hypertrophy. Stenosis of both  lateral recesses without  definite focal neural compression. Mild  foraminal narrowing bilaterally.  L5-S1: Desiccation of the disc with loss of height. Endplate  osteophytes and bulging of the disc more prominent towards the left.  Bilateral facet degeneration and hypertrophy left more than right.  Stenosis of the subarticular lateral recess on the left that could  compress the S1 nerve root. Mild foraminal narrowing left more than  right but without apparent compression of the exiting L5 nerve  roots.  Serpiginous fluid-filled structure in the left pelvis could  represent hydrosalpinx. This is not primarily or completely imaged.  IMPRESSION:  Degenerative disc disease and degenerative facet disease in the  lower lumbar spine that could be a cause of lumbago.  Lateral recess and foraminal narrowing at L3-4, L4-5 and L5-S1 as  described above that could be a cause of focal neural compression.  In general, this is more pronounced on the left than on the right.  In particular, there is potential for left S1 nerve root compression  in the subarticular lateral recess at L5-S1.  Electronically Signed  By: Nelson Chimes M.D.  On: 12/10/2013 08:45   CT L-Spine 9/18:  CLINICAL DATA: Low back pain with difficulty walking.  EXAM:  CT LUMBAR SPINE WITHOUT CONTRAST  TECHNIQUE:  Multidetector CT imaging of the lumbar spine was performed without  intravenous contrast administration. Multiplanar CT image  reconstructions were also generated.  COMPARISON: None.  FINDINGS:  There is no fracture or spondylolisthesis. There is moderately  severe disc space narrowing at L5-S1 with slight vacuum phenomenon  at this level. There is moderate narrowing at L4-5. There are  prominent anterior osteophytes at L2, L3, L4, and L5.  At T12-L1, there is moderate facet hypertrophy bilaterally. There is  no nerve root edema or effacement. No disc extrusion or stenosis.  At L1-2, there is moderate facet hypertrophy bilaterally. There is   mild diffuse disc bulging. There is no nerve root edema or  effacement. No disc extrusion or stenosis.  At L2-3, there  is broad-based disc bulging and moderate facet  hypertrophy bilaterally. There is borderline narrowing of the thecal  sac with borderline spinal stenosis. No disc extrusion.  At L3-4, there is severe facet osteoarthritic change bilaterally,  more pronounced on the left than on the right. There is broad-based  disc bulging and moderate ligamentum flavum hypertrophy. These  findings in concert lead to moderate generalized spinal stenosis.  At L4-5, there is broad-based disc protrusion and severe facet  osteoarthritic change bilaterally. These findings in concert lead to  moderate generalized spinal stenosis. No frank disc extrusion.  At L5-S1, there is severe facet osteoarthritic change on the left  and moderate facet osteoarthritic change on the right. There is  broad-based disc protrusion. There is exit foraminal narrowing  bilaterally, more severe on the left than on the right, due to  osteophytic change. No frank disc extrusion or stenosis is seen at  this level.  There are no paraspinous lesions.  IMPRESSION:  Spinal stenosis at L3-4 and L4-5, multifactorial. Borderline spinal  stenosis at L2-3. There does appear to be a degree of congenital  narrowing of the canal which contributes to spinal stenosis.  Multilevel arthropathy.  No frank disc extrusion.  No fracture or spondylolisthesis.  Electronically Signed  By: Lowella Grip M.D.  On: 12/09/2013 13:50   CXR 9/13:  CLINICAL DATA: Upper back pain for 5 weeks. Dyspnea on exertion.  Nonsmoker. History of hypertension.  EXAM:  CHEST 2 VIEW  COMPARISON: 04/15/2006  FINDINGS:  The heart is mildly enlarged. There is elevation of right  hemidiaphragm, stable in appearance. There is streaky density  favored to be within the right lower lobe, consistent with  atelectasis or infiltrate. No pulmonary edema. No  pleural effusions.  There are moderate degenerative changes in the thoracic spine.  IMPRESSION:  Right lower lobe atelectasis or infiltrate.  Electronically Signed  By: Shon Hale M.D.  On: 12/04/2013 19:26  Venous Duplex 9/24:  Summary:  - No evidence of deep vein thrombosis involving the right lower extremity. - No evidence of deep vein thrombosis involving the left lower extremity.   Aquilla Hacker, MD 12/19/2013, 9:44 AM PGY-3, [facility] Intern pager: [phone], text pages welcome

## 2013-12-19 NOTE — Progress Notes (Signed)
UR COMPLETED  

## 2013-12-19 NOTE — Progress Notes (Signed)
ANTICOAGULATION CONSULT NOTE - Follow Up Consult  Pharmacy Consult:  Heparin Indication: pulmonary embolus  Allergies  Allergen Reactions  . Tramadol Nausea And Vomiting    Patient Measurements: Height: 5\' 11"  (180.3 cm) Weight: 313 lb 15 oz (142.4 kg) IBW/kg (Calculated) : 70.8 Heparin Dosing Weight: 104 kg  Vital Signs: Temp: 97.8 F (36.6 C) (09/28 0945) Temp src: Oral (09/28 0945) BP: 144/87 mmHg (09/28 0945) Pulse Rate: 73 (09/28 0945)  Labs:  Recent Labs  12/17/13 0324  12/17/13 1830 12/18/13 0635 12/19/13 0709  HGB 10.6*  --   --  10.9* 12.1  HCT 31.2*  --   --  32.2* 35.4*  PLT 268  --   --  264 265  HEPARINUNFRC  --   < > 0.70 0.58 0.62  CREATININE 0.78  --   --  0.85 0.87  < > = values in this interval not displayed.  Estimated Creatinine Clearance: 125.4 ml/min (by C-G formula based on Cr of 0.87).     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 She had surgery on 12/15/13 for decompression.  She was also found to have a PE on CT chest started on IV heparin 48 hours post-operatively.  Heparin level today = 0.62 is therapeutic on 1500 units/hr IV heparin drip.  Hgb improved , 12.1 today and pltc stable at 265K. No bleeding reported.  Family medicine notes that plan for oral anticoagulant is pending pathology results with plan for oncology to see once path returns.     Goal of Therapy:  Heparin level 0.3-0.7 units/ml Monitor platelets by anticoagulation protocol: Yes    Plan:  - Continue heparin gtt at 1500 units/hr - Daily HL / CBC - F/U oral AC when possible, pending biopsy result & plan per oncology.    Thuy D. Mina Marble, PharmD, BCPS Pager:  [phone] 12/19/2013, 11:50 AM

## 2013-12-19 NOTE — Progress Notes (Signed)
COURTESY NOTE: Awaiting definitive path report to complete consult. If patient is discharged before path available will schedule Ms Dingee for consult as outpatient.

## 2013-12-19 NOTE — Progress Notes (Signed)
Seen and examined.  Agree with the documentation and management by Dr. Minda Ditto. Sad story.  Still not much leg movement.  Awaiting final path results.

## 2013-12-20 LAB — BASIC METABOLIC PANEL
Anion gap: 14 (ref 5–15)
BUN: 26 mg/dL — AB (ref 6–23)
CALCIUM: 9.2 mg/dL (ref 8.4–10.5)
CO2: 28 mEq/L (ref 19–32)
Chloride: 91 mEq/L — ABNORMAL LOW (ref 96–112)
Creatinine, Ser: 0.77 mg/dL (ref 0.50–1.10)
GFR calc Af Amer: >90 mL/min (ref >90)
GFR calc non Af Amer: >90 mL/min (ref >90)
Glucose, Bld: 245 mg/dL — ABNORMAL HIGH (ref 70–99)
Potassium: 4.9 mEq/L (ref 3.7–5.3)
SODIUM: 133 meq/L — AB (ref 137–147)

## 2013-12-20 LAB — CBC WITH DIFFERENTIAL/PLATELET
Basophils Absolute: 0 10*3/uL (ref 0.0–0.1)
Basophils Relative: 0 % (ref 0–1)
EOS PCT: 0 % (ref 0–5)
Eosinophils Absolute: 0 10*3/uL (ref 0.0–0.7)
HEMATOCRIT: 37.5 % (ref 36.0–46.0)
Hemoglobin: 12.9 g/dL (ref 12.0–15.0)
LYMPHS ABS: 1.8 10*3/uL (ref 0.7–4.0)
Lymphocytes Relative: 10 % — ABNORMAL LOW (ref 12–46)
MCH: 28.5 pg (ref 26.0–34.0)
MCHC: 34.4 g/dL (ref 30.0–36.0)
MCV: 83 fL (ref 78.0–100.0)
MONO ABS: 1.2 10*3/uL — AB (ref 0.1–1.0)
Monocytes Relative: 6 % (ref 3–12)
Neutro Abs: 15.7 10*3/uL — ABNORMAL HIGH (ref 1.7–7.7)
Neutrophils Relative %: 84 % — ABNORMAL HIGH (ref 43–77)
Platelets: 306 10*3/uL (ref 150–400)
RBC: 4.52 MIL/uL (ref 3.87–5.11)
RDW: 13.8 % (ref 11.5–15.5)
WBC: 18.7 10*3/uL — AB (ref 4.0–10.5)

## 2013-12-20 LAB — GLUCOSE, CAPILLARY
GLUCOSE-CAPILLARY: 220 mg/dL — AB (ref 70–99)
GLUCOSE-CAPILLARY: 298 mg/dL — AB (ref 70–99)
Glucose-Capillary: 353 mg/dL — ABNORMAL HIGH (ref 70–99)
Glucose-Capillary: 404 mg/dL — ABNORMAL HIGH (ref 70–99)

## 2013-12-20 LAB — HEPARIN LEVEL (UNFRACTIONATED): Heparin Unfractionated: 0.56 IU/mL (ref 0.30–0.70)

## 2013-12-20 MED ORDER — INSULIN ASPART 100 UNIT/ML ~~LOC~~ SOLN
0.0000 [IU] | Freq: Three times a day (TID) | SUBCUTANEOUS | Status: DC
  Administered 2013-12-20: 15 [IU] via SUBCUTANEOUS
  Administered 2013-12-20: 8 [IU] via SUBCUTANEOUS

## 2013-12-20 MED ORDER — INSULIN ASPART 100 UNIT/ML ~~LOC~~ SOLN
0.0000 [IU] | Freq: Every day | SUBCUTANEOUS | Status: DC
  Administered 2013-12-20: 20 [IU] via SUBCUTANEOUS
  Administered 2013-12-21: 3 [IU] via SUBCUTANEOUS
  Administered 2013-12-22 – 2013-12-23 (×2): 4 [IU] via SUBCUTANEOUS
  Administered 2013-12-24: 3 [IU] via SUBCUTANEOUS
  Administered 2013-12-25: 5 [IU] via SUBCUTANEOUS
  Administered 2013-12-26 – 2013-12-27 (×2): 2 [IU] via SUBCUTANEOUS
  Administered 2013-12-28: 3 [IU] via SUBCUTANEOUS
  Administered 2013-12-29: 2 [IU] via SUBCUTANEOUS
  Administered 2013-12-30: 3 [IU] via SUBCUTANEOUS
  Administered 2013-12-31 – 2014-01-08 (×5): 2 [IU] via SUBCUTANEOUS

## 2013-12-20 MED ORDER — SODIUM CHLORIDE 0.9 % IV SOLN
INTRAVENOUS | Status: DC
  Administered 2013-12-20 – 2014-01-01 (×7): via INTRAVENOUS
  Administered 2014-01-10: 10 mL/h via INTRAVENOUS
  Administered 2014-01-11: 16:00:00 via INTRAVENOUS
  Administered 2014-01-13: 10 mL/h via INTRAVENOUS

## 2013-12-20 MED ORDER — INSULIN ASPART 100 UNIT/ML ~~LOC~~ SOLN
0.0000 [IU] | Freq: Three times a day (TID) | SUBCUTANEOUS | Status: DC
  Administered 2013-12-21: 11 [IU] via SUBCUTANEOUS
  Administered 2013-12-21: 20 [IU] via SUBCUTANEOUS
  Administered 2013-12-21: 11 [IU] via SUBCUTANEOUS
  Administered 2013-12-22 (×3): 7 [IU] via SUBCUTANEOUS
  Administered 2013-12-23: 15 [IU] via SUBCUTANEOUS

## 2013-12-20 NOTE — Progress Notes (Signed)
Pt bedtime CBG was 404. On call family medicine MD Dr. Lacinda Axon paged and made aware. Instructed to administer 20 units of novolog for coverage. Will continue to monitor. Fortino Sic, RN, BSN 12/20/2013 9:14 PM

## 2013-12-20 NOTE — Progress Notes (Signed)
Results for FUSAE, FLORIO (MRN [id number]) as of 12/20/2013 14:31  Ref. Range 12/19/2013 11:40 12/19/2013 16:44 12/19/2013 20:39 12/20/2013 06:30 12/20/2013 11:27  Glucose-Capillary Latest Range: 70-99 mg/dL 260 (H) 308 (H) 319 (H) 220 (H) 298 (H)  CBGs continue to be greater than 180 mg/dl.  HgbA1C is 6.7% which is a diagnosis of diabetes per American Diabetes Association standards.  Will need to follow up with patient with DM education: Videos, booklet, dietician, blood glucose testing at home, etc.  Recommend physician speaking with patient about a possible diagnosis of diabetes.  May need to add Novolog 4-5 units TID meal coverage if patient eating at least 50% of meals.  Will continue to follow CBGs.   Spoke with staff RN about starting DM education if diagnosis is made. Harvel Ricks RN BSN CDE

## 2013-12-20 NOTE — Evaluation (Signed)
Occupational Therapy Evaluation Patient Details Name: Sarah [name] MRN: [id number] DOB: [birth year] Today's Date: 12/20/2013    History of Present Illness 48 y.o. female admitted to [facility] on 12/12/13 with progressive lower extremity weakness and mid to upper back pain.  Pt was found to have a mass at T3 with (+) spinal cord compression.  On 9/23 pt underwent surgery for decompression and biopsy.  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  Her thyroid is very enlarged.  Oncology has been consulted and are awaiting pathology results.  Pt with significant PMHx of HTN and chronic back pain.   Clinical Impression   Patient evaluated by Occupational Therapy with no further acute OT needs identified. All education has been completed and the patient has no further questions. OT awaiting incr activity orders to progress patient. See below for any follow-up Occupational Therapy or equipment needs. OT to sign off. Thank you for referral.      Follow Up Recommendations     TBA after mobility allowed- probably CIR required  Equipment Recommendations       Recommendations for Other Services       Precautions / Restrictions Precautions Precautions: Fall Precaution Comments: bil leg weakness, parapelegia      Mobility Bed Mobility               General bed mobility comments: bedrest currently  Transfers                      Balance                                            ADL                                         General ADL Comments: Pt educated on using tray items for UE HEP. Pt currenlty without changes to UB. Pt able to reach head to scratch and to operate cell phone. Pt educated no reaching above head due to precautions. OT to sign off acutely until ready for OOB     Vision                     Perception     Praxis      Pertinent Vitals/Pain       Hand Dominance Right   Extremity/Trunk Assessment Upper  Extremity Assessment Upper Extremity Assessment: Overall WFL for tasks assessed (no weakness noted)   Lower Extremity Assessment Lower Extremity Assessment: RLE deficits/detail RLE Deficits / Details: reports sensation with tapping, required multiple attempts to wiggle toes       Communication Communication Communication: No difficulties   Cognition Arousal/Alertness: Awake/alert Behavior During Therapy: WFL for tasks assessed/performed Overall Cognitive Status: Within Functional Limits for tasks assessed                     General Comments       Exercises       Shoulder Instructions      Home Living Family/patient expects to be discharged to:: Private residence Living Arrangements: Other relatives Available Help at Discharge: Family;Available 24 hours/day Type of Home: House Home Access: Stairs to enter [employer] of Steps: 2 (deep enough for all 4 legs of RW)  Entrance Stairs-Rails: None Home Layout: One level     Bathroom Shower/Tub: Tub/shower unit Shower/tub characteristics: Curtain Biochemist, clinical: Standard     Home Equipment: Environmental consultant - 2 wheels          Prior Functioning/Environment Level of Independence: Independent        Comments: Pt works at [employer].     OT Diagnosis:     OT Problem List:     OT Treatment/Interventions:      OT Goals(Current goals can be found in the care plan section)    OT Frequency:     Barriers to D/C:            Co-evaluation              End of Session    Activity Tolerance: Patient tolerated treatment well Patient left: in bed;with call bell/phone within reach   Time: 1332-1340 OT Time Calculation (min): 8 min Charges:  OT General Charges $OT Visit: 1 Procedure OT Evaluation $Initial OT Evaluation Tier I: 1 Procedure G-Codes:    Peri Maris 12-23-2013, 2:07 PM Pager: [phone]

## 2013-12-20 NOTE — Progress Notes (Signed)
Rehab Admissions Coordinator Note:  Patient was screened by Retta Diones for appropriateness for an Inpatient Acute Rehab Consult.  Noted PT recommending inpatient rehab consult.  Noted SW working on bed at [facility].  Need to complete workup and treatment plan.  Then can consider inpatient rehab consult if desired.   Retta Diones 12/20/2013, 8:57 AM  I can be reached at [phone].

## 2013-12-20 NOTE — Progress Notes (Signed)
ANTICOAGULATION CONSULT NOTE - Follow Up Consult  Pharmacy Consult:  Heparin Indication: pulmonary embolus  Allergies  Allergen Reactions  . Tramadol Nausea And Vomiting    Patient Measurements: Height: 5\' 11"  (180.3 cm) Weight: 313 lb 15 oz (142.4 kg) IBW/kg (Calculated) : 70.8 Heparin Dosing Weight: 104 kg  Vital Signs: Temp: 97.5 F (36.4 C) (09/29 1005) Temp src: Oral (09/29 1005) BP: 129/73 mmHg (09/29 1005) Pulse Rate: 78 (09/29 1005)  Labs:  Recent Labs  12/18/13 0635 12/19/13 0709 12/20/13 0720  HGB 10.9* 12.1 12.9  HCT 32.2* 35.4* 37.5  PLT 264 265 306  HEPARINUNFRC 0.58 0.62 0.56  CREATININE 0.85 0.87 0.77    Estimated Creatinine Clearance: 136.4 ml/min (by C-G formula based on Cr of 0.77).     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 She had surgery on 12/15/13 for decompression and biopsy.  Found to have a PE on CT chest and started on IV heparin 48 hours post-operatively.  Lower extremity Venous Duplex 12/15/13 negative for DVT.  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   Heparin level today = 0.56, remains therapeutic on 1500 units/hr IV heparin drip.  Hgb stable at 12.9 and pltc stable at 306K. No bleeding reported. MD notes patient with little neurological improvement.  Family medicine notes that plan for oral anticoagulant for longterm treatment of PD is pending pathology results with plan for oncology to see once path returns.      Goal of Therapy:  Heparin level 0.3-0.7 units/ml Monitor platelets by anticoagulation protocol: Yes    Plan:  - Continue heparin gtt at 1500 units/hr - Daily HL / CBC - F/U plan for oral AC when possible, pending biopsy result & plan per oncology.   Nicole Cella, RPh Clinical Pharmacist Pager: [phone]

## 2013-12-20 NOTE — Progress Notes (Signed)
Seen and examined.  Agree with the documentation and management of Dr. Minda Ditto.  Path looks like lung primary.  Will ask onc to weigh in whether further tissue is needed or if they can create a treatment plan with current information.

## 2013-12-20 NOTE — Progress Notes (Signed)
Subjective: Patient reports "I'm ok; just hurt a little when they turn me"  Objective: Vital signs in last 24 hours: Temp:  [97.5 F (36.4 C)-98.4 F (36.9 C)] 97.5 F (36.4 C) (09/29 0537) Pulse Rate:  [72-83] 75 (09/29 0537) Resp:  [18-20] 20 (09/29 0537) BP: (125-152)/(66-90) 138/87 mmHg (09/29 0537) SpO2:  [97 %-99 %] 98 % (09/29 0537)  Intake/Output from previous day: 09/28 0701 - 09/29 0700 In: -  Out: 4650 [Urine:4650] Intake/Output this shift:    Alert, conversant, reporting only discomfort incisional/periscapular with turning in bed. No movement BLE, but she does report sensation of light touch BLE. Incision flat, dry, without erythema or drainage. Dermabond covers site.   Lab Results:  Recent Labs  12/18/13 0635 12/19/13 0709  WBC 11.8* 14.3*  HGB 10.9* 12.1  HCT 32.2* 35.4*  PLT 264 265   BMET  Recent Labs  12/18/13 0635 12/19/13 0709  NA 137 135*  K 4.7 5.0  CL 97 93*  CO2 27 30  GLUCOSE 199* 226*  BUN 21 23  CREATININE 0.85 0.87  CALCIUM 8.8 9.1    Studies/Results: No results found.  Assessment/Plan: Awaiting pathology results   LOS: 8 days  Continue support, pending pathology result, for decision of additional surgery (mass excision &/or stabilization). Pt verbalizes understanding.   Verdis Prime 12/20/2013, 8:29 AM

## 2013-12-20 NOTE — Progress Notes (Signed)
Family Medicine Teaching Service Daily Progress Note Intern Pager: [phone]  Patient name: Sarah [name] Medical record number: [id number] Date of birth: [birth year] Age: 48 y.o. Gender: female  Primary Care Provider: No PCP Per Patient Consultants: None Code Status: Full  Pt Overview and Major Events to Date:  - 9/22: Pt. Admitted with lower extremity weakness.  - 9/23: Pt. Found to have mass at T-3 level with spinal compression.  - 9/24: Surgery performed for decompression and biopsy overnight. Pt. Post-op and doing well. 3cm Suprahilar mass on CT Chest, PE also identified. ?Thyroid etiology, but much less likely for mets. Oncology consulted.  - 9/25 - 9/29: Awaiting pathology results with plan for oncology to see once path returns. Pt. With little neurological improvement. Started with light PT.   Assessment and Plan: PATTI SHORB is a 48 y.o. female presenting with continued lower extremity weakness at baseline after discharge from hospital on 9/21. PMH is significant for obesity, HTN, and ?History of Hypothyroidism previously on Synthroid per patient.   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 Considering Mets vs. Primary tumor. Pulmonary lesion noted on CT as well as Large thyroid mass ? Goiter. Family history and social history on further evaluation reveal little in the way of risk factors. Subsequent CT chest, abdomen, pelvis with 3cm R Suprahilar pulmonary nodule and multiple smaller nodules. Neurosurgery took her to the OR on 9/23, and biopsied as well as performed laminectomy for decompression. - PT recommendations SNF with 24hour supervision. Social work on Mining engineer  - Automotive engineer for PT with Neurosurgery. Light PT at this point. Pt. Not to get UOB.  - Decadron $RemoveBe'6mg'PBITORLTt$  q6hr continues. No taper yet. Will discuss again plans  regarding this with neurosurgery.  - Large thyroid goiter noted. TSH normal, but likely needs ultrasound, ? Biopsy. Won't be able to get uptake scan due to recent contrast load due to MRI. Plan based on path results.  - Waiting for surgical pathology in order to direct further workup and discussion regarding this patient. Oncology to see once pathology returns.  - flexeril, oxycodone for back pain, though currently controlled on Tylenol.  - foley catheter in place for urinary retention  - Fecal retention - enema performed yesterday, and patient reports some relief.  - TSH, B12 Level, Mg, Phos - all relatively normal  - Random cortisol - Nl.  - ESR elevated, CRP elevted nonspecific but now concerning given new lesion on spine, and potential for mets.  - Very Little return of motor function at this time, and minimal improvement in sensory function. No reimaging at this time per neurosurgery. - Overall this indicates a likely very poor prognosis. Most likely mets given presentation. Preliminary pathology (verbally communicated on 9/25) suggestive of possible thyroid primary but final stains pending and likely come back today. Will pursue further workup once we have more information, and pending discussion with oncology and neurosurgery.   #Pulmonary Embolus - Found on CT Chest in R middle and R lower lobe pulmonary arteries. Pt. With brief episodes of tachycardia, but otherwise asymptomatic with O2 sats 100% on 2L [place]. Pt. Without chest pain at this time. Blood pressures stable. Lower extremity Venous Duplex negative for DVT.  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 ESR elevated.  - Now on IV heparin (waited 48 hrs post surgery) - Will continue to monitor VS. Stable  at this time. - Pending discussion with oncology, but will probably need transition to Coumadin for long term treatment of PE given hypercoagulability in the setting of what is likely to be cancer. Will anticipate this for now, but  discuss further once pathology results return.   #Hyperglycemia -  Likely diabetic with PCOS given A1C of 6.7. CBG's 200's - 300.  - Start on Sensitive SSI for glucose control in the hospital.  - Poor control with CBG's still 200-300. Switch to moderate SSI. - Continue to monitor.   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 PCOS vs. Adrenal pathology?[name] Kitchen Would be unlike to be first presentation of HPA pathology; no evidence of salt wasting on labs. Hypertensive and not hypotensive. Testing during this hospitalization after having recently received steroids would be largely non-diagnostic. Though, could also be a combination of isolated processes i.e. Clitoromegaly, Hirsutism 2/2 obesity. - Recent thought of outpt vs inpt w/up including [place] to assess ovaries vs 21-hydroxylase/ACTH/cortisol.  - TSH, Cortisol WNL - A1C : 6.7 - consistent with PCOS given evidence of cystic structures in pelvis.   #HTN- BP largely uncontrolled 150s-180s/70s-90s; previously on BP med that made her feel badly? Unclear what kind  -started with losartan 12.45m at last admission.  -continue losartan here.  - Blood pressure well controlled here.    #Obesity-  - consistent with potential PCOS.   FEN/GI: Reg diet; KVO; s/p Enema with some relief Prophylaxis: on full dose IV heparin  Disposition: Neurosurgery and Oncology on board. Will continue work up as indicated. PT again today. Oncology discussion pending pathology results. Goals of care discussion to determine dispo.   Subjective:  Pt. States that she had a much better night last night. She reports improvement in her back discomfort with turning every 2 hours. She has had BM x 2 since yesterday. She continues with foley catheter in place.   Objective: Temp:  [97.5 F (36.4 C)-98.4 F (36.9 C)] 97.5 F (36.4 C) (09/29 0537) Pulse Rate:  [72-83] 75 (09/29 0537) Resp:  [18-20] 20 (09/29  0537) BP: (125-152)/(66-90) 138/87 mmHg (09/29 0537) SpO2:  [97 %-99 %] 98 % (09/29 0537) Physical Exam: General: NAD, AAOx3, morbidly obese  HEENT: Thyromegaly / Goiter noted on exam without tenderness, and no nodules, no LAD, neck supple  Abdomen: Soft and nontender, mildly distended, No organomegaly though exam limited by body habitus Extremities: no edema,  L  Foot somewhat more cool that R with 2+ pulse bilaterally and no calf tenderness bilaterally.   Skin: No lesions or rashes noted, incision over T-spine C/D/I Neuro: cranial nerves grossly normal. Normal speech, minimal ability 1/5  Plantarflexion of R foot. full sensation to light touch, no sensory deficits. + babinski bilaterally, and hyperreflexivity noted. No changes in neuro exam, no improvement.   Laboratory:  Recent Labs Lab 12/18/13 0635 12/19/13 0709 12/20/13 0720  WBC 11.8* 14.3* 18.7*  HGB 10.9* 12.1 12.9  HCT 32.2* 35.4* 37.5  PLT 264 265 306    Recent Labs Lab 12/13/13 1055 12/15/13 0314  12/18/13 0635 12/19/13 0709 12/20/13 0720  NA 138 136*  < > 137 135* 133*  K 3.7 4.5  < > 4.7 5.0 4.9  CL 96 97  < > 97 93* 91*  CO2 29 24  < > _0 BUN 14 18  < > 21 23 26*  CREATININE 0.80 1.08  < > 0.85 0.87 0.77  CALCIUM 8.8 8.5  < > 8.8 9.1 9.2  PROT  --  7.0  --   --   --   --   BILITOT  --  1.4*  --   --   --   --   ALKPHOS  --  118*  --   --   --   --   ALT  --  19  --   --   --   --   AST  --  30  --   --   --   --   GLUCOSE 121* 216*  < > 199* 226* 245*  < > = values in this interval not displayed. Urinalysis    Component Value Date/Time   COLORURINE YELLOW 12/10/2013 1036   APPEARANCEUR CLOUDY* 12/10/2013 1036   LABSPEC 1.022 12/10/2013 1036   PHURINE 5.5 12/10/2013 1036   GLUCOSEU NEGATIVE 12/10/2013 1036   HGBUR NEGATIVE 12/10/2013 1036   BILIRUBINUR NEGATIVE 12/10/2013 1036   KETONESUR NEGATIVE 12/10/2013 1036   PROTEINUR NEGATIVE 12/10/2013 1036   UROBILINOGEN 1.0 12/10/2013 1036   NITRITE  NEGATIVE 12/10/2013 1036   LEUKOCYTESUR NEGATIVE 12/10/2013 1036    Phos - 3.9 Mg - 2.2 CRP - 11.6 ESR - 59 CK - 56 B12 - 209 Random Cortisol - 25.4 A1C - 6.7 TSH - 2.13 RPR - nonreactive HIV - nonreactive UPT - Negative  Imaging/Diagnostic Tests:  CT Chest Abd/ Pelvis 9/23:  CLINICAL DATA: Destructive metastatic lesion of the T3 vertebral  body with no known primary carcinoma.  EXAM:  CT CHEST, ABDOMEN, AND PELVIS WITH CONTRAST  TECHNIQUE:  Multidetector CT imaging of the chest, abdomen and pelvis was  performed following the standard protocol during bolus  administration of intravenous contrast.  CONTRAST: 139m OMNIPAQUE IOHEXOL 300 MG/ML SOLN  COMPARISON: MRI of the cervical spine earlier today.  FINDINGS:  CT CHEST FINDINGS  Within the chest, a right suprahilar mass measures approximately 3.0  x 2.7 x 2.2 cm. This either represents a nodal metastasis or central  lung mass such as small cell carcinoma. A number of smaller  mediastinal lymph nodes are also identified in the right  paratracheal region, prevascular space and AP window.  Destructive lesion of the T3 vertebral body noted which nearly  replaces the entire vertebral body with tumor extension posteriorly  into the epidural space causing significant compression of the  spinal cord. Paravertebral extension of tumor is also suspected  lateral and anterior to the vertebral body, right greater than left.  Massive enlargement of the thyroid gland present with transverse  dimensions of approximately 7.7 x 8.5 cm. The thyroid gland is  incompletely visualized as the neck was not included. Central  dystrophic calcification identified. The enlarged thyroid gland does  cause significant mass effect on the airway with evidence of  tracheal deviation to the right and tracheal narrowing at the  thoracic inlet. The enlarged thyroid gland extends into the superior  mediastinum.  There is evidence of pulmonary embolism  with nonocclusive thrombus  identified in the right middle lobe and lower lobe pulmonary  arteries. Pulmonary arterial opacification is not optimal on the  standard venous phase evaluation of the chest but the right sided  pulmonary embolism is clearly present.  There are small bilateral pulmonary nodules. Right-sided pulmonary  nodule measuring 6 mm likely localizes to the very top of the lower  lobe near the major fissure. Left upper lobe nodule measures 7 mm.  Lingular nodule measures 7 mm.  Left lower lobe nodule measures 7 mm.  Faint peripheral right middle lobe nodule measures 3 mm. Inferior  right middle lobe nodule measures 6 mm. Several small nodules in the  posterior right upper lobe identified in the 3-4 mm range.  No evidence of pleural or pericardial fluid. No other bony lesions  identified in the thorax.  CT ABDOMEN AND PELVIS FINDINGS  Small low-density lesion in the left lobe of the liver measures 7 mm  and likely represents a cyst. No other hepatic abnormalities. The  gallbladder, pancreas, spleen, adrenal glands and kidneys are  unremarkable. No adenopathy is identified in the abdomen or pelvis.  Bowel shows normal caliber without evidence of obstruction, lesion  or inflammation. No abnormal fluid collections.  The bladder is moderately distended. Elongated cystic changes in  both adnexal regions identified which may represent a combination of  ovarian cysts and potentially hydrosalpinx. Correlation with pelvic  ultrasound is recommended. No hernias are identified. The uterus  appears unremarkable by CT. No bony lesions are identified in the  abdomen or pelvis.  IMPRESSION:  1. 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  2. 3 cm right suprahilar mass may represent nodal metastasis versus  primary central tumor. Next number multiple small  bilateral  pulmonary nodules consistent with metastatic disease.  3. Acute nonocclusive pulmonary embolism in the right middle lobe  and right lower lobe pulmonary arteries.  4. Massive thyroid goiter causing tracheal deviation to the right  and narrowing of the trachea.  5. Tubular cystic abnormalities of both adnexal regions which may be  a combination of cystic changes in the ovaries and potentially  hydrosalpinx. Correlation recommended with pelvic ultrasound to  exclude any suspicious cystic lesions of the adnexae.  6. Critical Value/emergent results were called by telephone at the  time of interpretation on 12/14/2013 at 4:52 pm to Dr. Tamala Julian of the  [facility] Medicine Teaching Service, who verbally acknowledged these  results.  Electronically Signed  By: Aletta Edouard M.D.  On: 12/14/2013 16:53   MRI T-spine 9/23:  CLINICAL DATA: Lower extremity weakness. Worsening back pain.  Compressive upper thoracic mass partially visualized on cervical  spine MRI as well as chest CT with spinal cord compression.  EXAM:  MRI THORACIC SPINE WITHOUT CONTRAST  TECHNIQUE:  Multiplanar, multisequence MR imaging of the thoracic spine was  performed. No intravenous contrast was administered.  COMPARISON: Cervical spine MRI and chest CT earlier today  FINDINGS:  There is a T3 compression fracture with approximately 50% height  loss centrally. As described on cervical spine MRI and chest CT  earlier today, there is abnormal soft tissue replacing the majority  of the T3 vertebral body extending into the pedicles as well as into  the ventral epidural space with moderate cord compression. Epidural  extension is slightly greater on the right and extends into the  inferior aspect of the right T2-3 neural foramen as well as superior  aspect of the right T3-4 neural foramen. There is also abnormal  paravertebral soft tissue laterally and anteriorly as seen on CT.  Central ventral epidural material  posterior to the T4 vertebral body  may represent tumor or hematoma. Mild T2 hyperintensity is present  in the spinal cord at the T3 level extending inferiorly to the T4-5  disc space level consistent with mild edema. Mild edema is present  in the posterior paraspinal soft  tissues of the upper thoracic  spine. No other vertebral body lesions are identified.  There is a moderate-sized central disc extrusion at T7-8 which  contacts and mildly flattens the ventral spinal cord with mild  spinal stenosis. A small left central disc protrusion is present at  T8-9 with at most minimal impression on the spinal cord. Massive  enlargement of the left thyroid lobe is partially visualized.  Mediastinal and lung findings are better demonstrated on recent  chest CT.  IMPRESSION:  1. T3 vertebral body mass with pathologic compression fracture and  epidural tumor resulting in moderate cord compression with evidence  of mild spinal cord edema.  2. T7-8 disc extrusion resulting in mild spinal stenosis.   MRI Head / C-Spine 9/23:  CLINICAL DATA: 48 year old female who presented with back pain and  lower extremity numbness, with continued inadequately explained  lower extremity weakness following Hospital discharge on 12/12/2013.  Initial encounter.  EXAM:  MRI HEAD WITHOUT AND WITH CONTRAST  MRI CERVICAL SPINE WITHOUT AND WITH CONTRAST  TECHNIQUE:  Multiplanar, multiecho pulse sequences of the brain and surrounding  structures, and cervical spine, to include the craniocervical  junction and cervicothoracic junction, were obtained without and  with intravenous contrast.  CONTRAST: 20 mL MultiHance.  COMPARISON: Lumbar MRI 12/10/2013.  FINDINGS:  MRI HEAD FINDINGS  Cerebral volume is normal. No restricted diffusion to suggest acute  infarction. No midline shift, mass effect, evidence of mass lesion,  ventriculomegaly, extra-axial collection or acute intracranial  hemorrhage. Cervicomedullary  junction and pituitary are within  normal limits. Major intracranial vascular flow voids are preserved.  Pearline Cables and white matter signal is within normal limits for age  throughout the brain. No abnormal enhancement identified, pulsation  artifact prominent on axial post-contrast images.  Large body habitus. Small right frontal scalp lipoma (series 10,  image 37). Visible internal auditory structures appear normal. Mild  right mastoid fluid. Minor paranasal sinus mucosal thickening.  Visualized orbit soft tissues are within normal limits. Partially  visible thyromegaly greater on the left, see below. Normal bone  marrow signal.  MRI CERVICAL SPINE FINDINGS  Large body habitus resulting in wrap artifact on axial images.  However, there is massive thyroid enlargement evident, with the left  lobe measuring at least 7.2 x 5.2 x 11.2 cm (AP by transverse by  CC). The trachea is displaced to the right at the thoracic inlet and  mildly compressed. There is other regional mass effect including  sheath. On the left carotid  Fairly preserved cervical lordosis. No marrow edema or evidence of  acute osseous abnormality. Cervicomedullary junction is within  normal limits. Spinal cord signal is within normal limits at all  visualized levels. No abnormal intradural enhancement.  C2-C3: Negative.  C3-C4: Disc osteophyte complex, facet and ligament flavum  hypertrophy. Along with a degree of congenital canal narrowing,  there is mild spinal stenosis and moderate biforaminal stenosis.  C4-C5: Mild uncovertebral and facet hypertrophy. Mild right C5  foraminal stenosis.  C5-C6: Circumferential disc osteophyte complex. Along with a degree  of congenital canal narrowing there is mild spinal stenosis.  Moderate right C6 foraminal stenosis.  C6-C7: Negative except for mild facet hypertrophy.  C7-T1: Negative.  T1-T2: Negative.  Faintly visualized at the T3 level in the upper thoracic spine there  is a  lobulated solidly enhancing mass which appears to extend into  the epidural space from the T3 vertebral body. This is inseparable  from the spinal cord which is likely compressed. See  series 2000  image 7 and series 1500, image 6. This is not included on axial  images.  IMPRESSION:  1. Faintly visible but aggressive appearing enhancing mass at the T3  level of the upper thoracic spine, likely with spinal cord  compression evaluate further are with thoracic MRI without and with  contrast.  2. Normal for age MRI appearance of the brain.  3. Massive thyroid enlargement, greater on the left. Left thyroid  goiter/mass measuring at least 7.2 x 5.2 x 11.2 cm. Mass effect at  the thoracic inlet including on the trachea.  4. Mild chronic disc and endplate degeneration in the cervical spine  at C3-C4 and C5-C6 resulting in borderline to mild spinal stenosis,  but no cervical spinal cord mass effect or signal abnormality.  Electronically Signed:  By: Lars Pinks M.D.  On: 12/14/2013 09:50   MRI L-Spine 9/19:  CLINICAL DATA: Back pain with bilateral leg numbness, worsening  with oral steroids.  EXAM:  MRI LUMBAR SPINE WITHOUT CONTRAST  TECHNIQUE:  Multiplanar, multisequence MR imaging of the lumbar spine was  performed. No intravenous contrast was administered.  COMPARISON: CT 12/09/2013  FINDINGS:  There is no degenerative disease at L2-3 or above. The discs are  normal in signal characteristics and morphology. The spinal canal  and foramina are widely patent. The distal cord and conus are normal  with conus tip at L1. There are small benign and insignificant  hemangiomas within the L2 and L3 vertebral bodies.  L3-4: Minimal desiccation of the disc. Mild circumferential bulging  slightly more prominent towards the left. Bilateral facet  degeneration and hypertrophy. Mild narrowing of the lateral recesses  without apparent neural compression. Mild foraminal narrowing on the  left without  apparent neural compression.  L4-5: Desiccation of the disc with circumferential protrusion.  Bilateral facet and ligamentous hypertrophy. Stenosis of both  lateral recesses without definite focal neural compression. Mild  foraminal narrowing bilaterally.  L5-S1: Desiccation of the disc with loss of height. Endplate  osteophytes and bulging of the disc more prominent towards the left.  Bilateral facet degeneration and hypertrophy left more than right.  Stenosis of the subarticular lateral recess on the left that could  compress the S1 nerve root. Mild foraminal narrowing left more than  right but without apparent compression of the exiting L5 nerve  roots.  Serpiginous fluid-filled structure in the left pelvis could  represent hydrosalpinx. This is not primarily or completely imaged.  IMPRESSION:  Degenerative disc disease and degenerative facet disease in the  lower lumbar spine that could be a cause of lumbago.  Lateral recess and foraminal narrowing at L3-4, L4-5 and L5-S1 as  described above that could be a cause of focal neural compression.  In general, this is more pronounced on the left than on the right.  In particular, there is potential for left S1 nerve root compression  in the subarticular lateral recess at L5-S1.  Electronically Signed  By: Nelson Chimes M.D.  On: 12/10/2013 08:45   CT L-Spine 9/18:  CLINICAL DATA: Low back pain with difficulty walking.  EXAM:  CT LUMBAR SPINE WITHOUT CONTRAST  TECHNIQUE:  Multidetector CT imaging of the lumbar spine was performed without  intravenous contrast administration. Multiplanar CT image  reconstructions were also generated.  COMPARISON: None.  FINDINGS:  There is no fracture or spondylolisthesis. There is moderately  severe disc space narrowing at L5-S1 with slight vacuum phenomenon  at this level. There is moderate narrowing at L4-5. There are  prominent anterior osteophytes at L2, L3, L4, and L5.  At T12-L1, there is  moderate facet hypertrophy bilaterally. There is  no nerve root edema or effacement. No disc extrusion or stenosis.  At L1-2, there is moderate facet hypertrophy bilaterally. There is  mild diffuse disc bulging. There is no nerve root edema or  effacement. No disc extrusion or stenosis.  At L2-3, there is broad-based disc bulging and moderate facet  hypertrophy bilaterally. There is borderline narrowing of the thecal  sac with borderline spinal stenosis. No disc extrusion.  At L3-4, there is severe facet osteoarthritic change bilaterally,  more pronounced on the left than on the right. There is broad-based  disc bulging and moderate ligamentum flavum hypertrophy. These  findings in concert lead to moderate generalized spinal stenosis.  At L4-5, there is broad-based disc protrusion and severe facet  osteoarthritic change bilaterally. These findings in concert lead to  moderate generalized spinal stenosis. No frank disc extrusion.  At L5-S1, there is severe facet osteoarthritic change on the left  and moderate facet osteoarthritic change on the right. There is  broad-based disc protrusion. There is exit foraminal narrowing  bilaterally, more severe on the left than on the right, due to  osteophytic change. No frank disc extrusion or stenosis is seen at  this level.  There are no paraspinous lesions.  IMPRESSION:  Spinal stenosis at L3-4 and L4-5, multifactorial. Borderline spinal  stenosis at L2-3. There does appear to be a degree of congenital  narrowing of the canal which contributes to spinal stenosis.  Multilevel arthropathy.  No frank disc extrusion.  No fracture or spondylolisthesis.  Electronically Signed  By: Lowella Grip M.D.  On: 12/09/2013 13:50   CXR 9/13:  CLINICAL DATA: Upper back pain for 5 weeks. Dyspnea on exertion.  Nonsmoker. History of hypertension.  EXAM:  CHEST 2 VIEW  COMPARISON: 04/15/2006  FINDINGS:  The heart is mildly enlarged. There is elevation  of right  hemidiaphragm, stable in appearance. There is streaky density  favored to be within the right lower lobe, consistent with  atelectasis or infiltrate. No pulmonary edema. No pleural effusions.  There are moderate degenerative changes in the thoracic spine.  IMPRESSION:  Right lower lobe atelectasis or infiltrate.  Electronically Signed  By: Shon Hale M.D.  On: 12/04/2013 19:26  Venous Duplex 9/24:  Summary:  - No evidence of deep vein thrombosis involving the right lower extremity. - No evidence of deep vein thrombosis involving the left lower extremity.   Aquilla Hacker, MD 12/20/2013, 9:20 AM PGY-3, [facility] Intern pager: [phone], text pages welcome

## 2013-12-21 ENCOUNTER — Telehealth: Payer: Self-pay | Admitting: *Deleted

## 2013-12-21 DIAGNOSIS — I82409 Acute embolism and thrombosis of unspecified deep veins of unspecified lower extremity: Secondary | ICD-10-CM

## 2013-12-21 DIAGNOSIS — C7952 Secondary malignant neoplasm of bone marrow: Secondary | ICD-10-CM

## 2013-12-21 DIAGNOSIS — C3490 Malignant neoplasm of unspecified part of unspecified bronchus or lung: Secondary | ICD-10-CM

## 2013-12-21 DIAGNOSIS — G119 Hereditary ataxia, unspecified: Secondary | ICD-10-CM

## 2013-12-21 DIAGNOSIS — C7951 Secondary malignant neoplasm of bone: Secondary | ICD-10-CM

## 2013-12-21 DIAGNOSIS — G959 Disease of spinal cord, unspecified: Secondary | ICD-10-CM

## 2013-12-21 DIAGNOSIS — E049 Nontoxic goiter, unspecified: Secondary | ICD-10-CM

## 2013-12-21 DIAGNOSIS — D72829 Elevated white blood cell count, unspecified: Secondary | ICD-10-CM

## 2013-12-21 LAB — GLUCOSE, CAPILLARY
GLUCOSE-CAPILLARY: 252 mg/dL — AB (ref 70–99)
GLUCOSE-CAPILLARY: 367 mg/dL — AB (ref 70–99)
Glucose-Capillary: 256 mg/dL — ABNORMAL HIGH (ref 70–99)
Glucose-Capillary: 296 mg/dL — ABNORMAL HIGH (ref 70–99)

## 2013-12-21 LAB — BASIC METABOLIC PANEL
Anion gap: 14 (ref 5–15)
BUN: 24 mg/dL — AB (ref 6–23)
CALCIUM: 9 mg/dL (ref 8.4–10.5)
CO2: 24 mEq/L (ref 19–32)
Chloride: 93 mEq/L — ABNORMAL LOW (ref 96–112)
Creatinine, Ser: 0.7 mg/dL (ref 0.50–1.10)
GFR calc Af Amer: >90 mL/min (ref >90)
GLUCOSE: 263 mg/dL — AB (ref 70–99)
Potassium: 4.6 mEq/L (ref 3.7–5.3)
Sodium: 131 mEq/L — ABNORMAL LOW (ref 137–147)

## 2013-12-21 LAB — HEPARIN LEVEL (UNFRACTIONATED): Heparin Unfractionated: 0.52 IU/mL (ref 0.30–0.70)

## 2013-12-21 MED ORDER — HYDROCODONE-ACETAMINOPHEN 5-325 MG PO TABS
1.0000 | ORAL_TABLET | ORAL | Status: DC | PRN
  Administered 2013-12-23 (×2): 1 via ORAL
  Filled 2013-12-21 (×2): qty 1

## 2013-12-21 MED ORDER — DEXAMETHASONE 4 MG PO TABS
4.0000 mg | ORAL_TABLET | Freq: Three times a day (TID) | ORAL | Status: DC
  Administered 2013-12-21 – 2013-12-24 (×9): 4 mg via ORAL
  Filled 2013-12-21 (×9): qty 1

## 2013-12-21 NOTE — Progress Notes (Signed)
Family Medicine Teaching Service Daily Progress Note Intern Pager: [phone]  Patient name: Sarah [name] Medical record number: [id number] Date of birth: 10-Sep-1965 Age: 48 y.o. Gender: female  Primary Care Provider: No PCP Per Patient Consultants: None Code Status: Full  Pt Overview and Major Events to Date:  - 9/22: Pt. Admitted with lower extremity weakness.  - 9/23: Pt. Found to have mass at T-3 level with spinal compression.  - 9/24: Surgery performed for decompression and biopsy overnight. Pt. Post-op and doing well. 3cm Suprahilar mass on CT Chest, PE also identified. ?Thyroid etiology, but much less likely for mets. Oncology consulted.  - 9/25 - 9/29: Awaiting pathology results with plan for oncology to see once path returns. Pt. With little neurological improvement. Started with light PT.  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 Oncology on board. Neurosurgery to comment on need for spinal stabilization surgery.   Assessment and Plan: Sarah [name] is a 48 y.o. female presenting with continued lower extremity weakness at baseline after discharge from hospital on 9/21. PMH is significant for obesity, HTN, and ?History of Hypothyroidism previously on Synthroid per patient.   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 Considering Mets vs. Primary tumor. Pulmonary lesion noted on CT as well as Large thyroid mass ? Goiter. Family history and social history on further evaluation reveal little in the way of risk factors. Subsequent CT chest, abdomen, pelvis with 3cm R Suprahilar pulmonary nodule and multiple smaller nodules. Neurosurgery took her to the OR on 9/23, and biopsied as well as performed laminectomy for decompression. - PT recommendations SNF with 24hour  supervision. Will plan dispo based on goals of care discussion.  - Discussed Recs for PT with Neurosurgery. Light PT at this point. Pt. Not to get UOB.  - Decadron taper initiated 58mQID > 496mTID > 18m48mID for now.  - Large thyroid goiter noted. TSH normal, but likely needs ultrasound, ? Biopsy. Pending Oncology recommendations.  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 Oncology on board.  - flexeril, oxycodone switched to vicodin for back pain.  - foley catheter in place for urinary retention  - Fecal retention - pt. Continues to have BM on her own.   - ESR , CRP elevated.  - Very Little return of motor function at this time, poor neurological exam continues, and minimal improvement in sensory function. - Overall this indicates a likely very poor prognosis. 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 Oncology is on board and will meet with her and her family this evening. Goals of care to be discussed with likely plan for outpatient follow up.  - Per Neurosurgery, Dr. CabChristella Noall address potential need for spinal stabilization surgery prior to discharge.    #Pulmonary Embolus - Found on CT Chest in R middle and R lower lobe pulmonary arteries. Pt. With brief episodes of tachycardia, but otherwise asymptomatic with O2 sats 100% on 2L [place]. Pt. Without chest pain at this time. Blood pressures stable. Lower extremity Venous Duplex negative for DVT.  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 ESR elevated.  - Now on IV heparin (waited 48 hrs post surgery) - Will continue to monitor VS. Stable at this time. - Continue heparin for now given the potential for  surgery. Will need bridge to coumadin prior to discharge.  #Hyperglycemia -  Likely diabetic with PCOS given A1C of 6.7. CBG's 200's - 300.  - Start on Sensitive SSI for glucose control in the hospital.  - Patient informed of elevated A1C and likely  diagnosis of DMII. She agrees with [place] that this is a marginal issue at this point, and we will recommend treatment based upon our goals of care discussion.  - Poor control with CBG's still 200-300. Switch to moderate SSI. Will monitor CBG's and back off on insulin dose as decadron is tapered down.  - Continue to monitor.   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 PCOS vs. Adrenal patholog. Would be unlike to be first presentation of HPA pathology; no evidence of salt wasting on labs. Hypertensive and not hypotensive. Testing during this hospitalization after having recently received steroids would be largely non-diagnostic. Though, could also be a combination of isolated processes i.e. Clitoromegaly, Hirsutism 2/2 obesity. - Recent thought of outpt vs inpt w/up including [place] to assess ovaries vs 21-hydroxylase/ACTH/cortisol.  - TSH, Cortisol WNL - A1C : 6.7 - consistent with PCOS given evidence of cystic structures in pelvis.   #HTN- BP fairly well controlled; previously on BP med that made her feel badly? Unclear what kind  -started with losartan 12.27m at last admission.  -continue losartan.  - Blood pressure well controlled here.     #Obesity-  - consistent with potential PCOS.   FEN/GI: Reg diet; KVO; s/p Enema with some relief Prophylaxis: on full dose IV heparin  Disposition: Pending Goals of Care discussion. Neurosurgery to address need for surgical spinal stabilization.   Subjective:  Pt. Without complaints this am. She says that she is doing ok since her conversation about the fact that she has cancer yesterday. She desires to wait until the rest of her family is in the room this afternoon before hearing the results of surgical pathology. She has no other complaints this morning, other than desiring less potent pain medication.   Objective: Temp:  [97.8 F (36.6 C)-98.6 F (37 C)] 97.8 F (36.6 C) (09/30  1013) Pulse Rate:  [70-83] 70 (09/30 1013) Resp:  [18-20] 18 (09/30 1013) BP: (129-154)/(76-90) 134/79 mmHg (09/30 1013) SpO2:  [96 %-100 %] 100 % (09/30 1013) Physical Exam: General: NAD, AAOx3, morbidly obese  HEENT: Thyromegaly / Goiter noted on exam without tenderness, and no nodules, no LAD, neck supple  Abdomen: Soft and nontender, mildly distended, No organomegaly noted.  Extremities: no edema,  No calf tenderness, 2+ distal pulses bilaterally.    Skin: No lesions or rashes noted, incision over T-spine C/D/I Neuro: cranial nerves grossly normal. Normal speech,0/5  Plantarflexion BL feet. 0/5 Hip Flexor / extensor movement. full sensation to light touch, no sensory deficits. + babinski bilaterally, and hyperreflexivity noted. Neuro exam is at new baseline. No new changes. No sensory deficits, and poor motor exam.   Laboratory:  Recent Labs Lab 12/18/13 0635 12/19/13 0709 12/20/13 0720  WBC 11.8* 14.3* 18.7*  HGB 10.9* 12.1 12.9  HCT 32.2* 35.4* 37.5  PLT 264 265 306    Recent Labs Lab 12/15/13 0314  12/19/13 0709 12/20/13 0720 12/21/13 0601  NA 136*  < > 135* 133* 131*  K 4.5  < > 5.0 4.9 4.6  CL 97  < > 93* 91* 93*  CO2 24  < > _0 BUN 18  < >  23 26* 24*  CREATININE 1.08  < > 0.87 0.77 0.70  CALCIUM 8.5  < > 9.1 9.2 9.0  PROT 7.0  --   --   --   --   BILITOT 1.4*  --   --   --   --   ALKPHOS 118*  --   --   --   --   ALT 19  --   --   --   --   AST 30  --   --   --   --   GLUCOSE 216*  < > 226* 245* 263*  < > = values in this interval not displayed. Urinalysis    Component Value Date/Time   COLORURINE YELLOW 12/10/2013 1036   APPEARANCEUR CLOUDY* 12/10/2013 1036   LABSPEC 1.022 12/10/2013 1036   PHURINE 5.5 12/10/2013 1036   GLUCOSEU NEGATIVE 12/10/2013 1036   HGBUR NEGATIVE 12/10/2013 1036   BILIRUBINUR NEGATIVE 12/10/2013 1036   KETONESUR NEGATIVE 12/10/2013 1036   PROTEINUR NEGATIVE 12/10/2013 1036   UROBILINOGEN 1.0 12/10/2013 1036   NITRITE NEGATIVE  12/10/2013 1036   LEUKOCYTESUR NEGATIVE 12/10/2013 1036    Phos - 3.9 Mg - 2.2 CRP - 11.6 ESR - 59 CK - 56 B12 - 209 Random Cortisol - 25.4 A1C - 6.7 TSH - 2.13 RPR - nonreactive HIV - nonreactive UPT - Negative  Imaging/Diagnostic Tests:  CT Chest Abd/ Pelvis 9/23:  CLINICAL DATA: Destructive metastatic lesion of the T3 vertebral  body with no known primary carcinoma.  EXAM:  CT CHEST, ABDOMEN, AND PELVIS WITH CONTRAST  TECHNIQUE:  Multidetector CT imaging of the chest, abdomen and pelvis was  performed following the standard protocol during bolus  administration of intravenous contrast.  CONTRAST: 113m OMNIPAQUE IOHEXOL 300 MG/ML SOLN  COMPARISON: MRI of the cervical spine earlier today.  FINDINGS:  CT CHEST FINDINGS  Within the chest, a right suprahilar mass measures approximately 3.0  x 2.7 x 2.2 cm. This either represents a nodal metastasis or central  lung mass such as small cell carcinoma. A number of smaller  mediastinal lymph nodes are also identified in the right  paratracheal region, prevascular space and AP window.  Destructive lesion of the T3 vertebral body noted which nearly  replaces the entire vertebral body with tumor extension posteriorly  into the epidural space causing significant compression of the  spinal cord. Paravertebral extension of tumor is also suspected  lateral and anterior to the vertebral body, right greater than left.  Massive enlargement of the thyroid gland present with transverse  dimensions of approximately 7.7 x 8.5 cm. The thyroid gland is  incompletely visualized as the neck was not included. Central  dystrophic calcification identified. The enlarged thyroid gland does  cause significant mass effect on the airway with evidence of  tracheal deviation to the right and tracheal narrowing at the  thoracic inlet. The enlarged thyroid gland extends into the superior  mediastinum.  There is evidence of pulmonary embolism with  nonocclusive thrombus  identified in the right middle lobe and lower lobe pulmonary  arteries. Pulmonary arterial opacification is not optimal on the  standard venous phase evaluation of the chest but the right sided  pulmonary embolism is clearly present.  There are small bilateral pulmonary nodules. Right-sided pulmonary  nodule measuring 6 mm likely localizes to the very top of the lower  lobe near the major fissure. Left upper lobe nodule measures 7 mm.  Lingular nodule measures 7 mm. Left lower lobe nodule  measures 7 mm.  Faint peripheral right middle lobe nodule measures 3 mm. Inferior  right middle lobe nodule measures 6 mm. Several small nodules in the  posterior right upper lobe identified in the 3-4 mm range.  No evidence of pleural or pericardial fluid. No other bony lesions  identified in the thorax.  CT ABDOMEN AND PELVIS FINDINGS  Small low-density lesion in the left lobe of the liver measures 7 mm  and likely represents a cyst. No other hepatic abnormalities. The  gallbladder, pancreas, spleen, adrenal glands and kidneys are  unremarkable. No adenopathy is identified in the abdomen or pelvis.  Bowel shows normal caliber without evidence of obstruction, lesion  or inflammation. No abnormal fluid collections.  The bladder is moderately distended. Elongated cystic changes in  both adnexal regions identified which may represent a combination of  ovarian cysts and potentially hydrosalpinx. Correlation with pelvic  ultrasound is recommended. No hernias are identified. The uterus  appears unremarkable by CT. No bony lesions are identified in the  abdomen or pelvis.  IMPRESSION:  1. 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  2. 3 cm right suprahilar mass may represent nodal metastasis versus  primary central tumor. Next number multiple small bilateral   pulmonary nodules consistent with metastatic disease.  3. Acute nonocclusive pulmonary embolism in the right middle lobe  and right lower lobe pulmonary arteries.  4. Massive thyroid goiter causing tracheal deviation to the right  and narrowing of the trachea.  5. Tubular cystic abnormalities of both adnexal regions which may be  a combination of cystic changes in the ovaries and potentially  hydrosalpinx. Correlation recommended with pelvic ultrasound to  exclude any suspicious cystic lesions of the adnexae.  6. Critical Value/emergent results were called by telephone at the  time of interpretation on 12/14/2013 at 4:52 pm to Dr. Tamala Julian of the  [facility] Medicine Teaching Service, who verbally acknowledged these  results.  Electronically Signed  By: Aletta Edouard M.D.  On: 12/14/2013 16:53   MRI T-spine 9/23:  CLINICAL DATA: Lower extremity weakness. Worsening back pain.  Compressive upper thoracic mass partially visualized on cervical  spine MRI as well as chest CT with spinal cord compression.  EXAM:  MRI THORACIC SPINE WITHOUT CONTRAST  TECHNIQUE:  Multiplanar, multisequence MR imaging of the thoracic spine was  performed. No intravenous contrast was administered.  COMPARISON: Cervical spine MRI and chest CT earlier today  FINDINGS:  There is a T3 compression fracture with approximately 50% height  loss centrally. As described on cervical spine MRI and chest CT  earlier today, there is abnormal soft tissue replacing the majority  of the T3 vertebral body extending into the pedicles as well as into  the ventral epidural space with moderate cord compression. Epidural  extension is slightly greater on the right and extends into the  inferior aspect of the right T2-3 neural foramen as well as superior  aspect of the right T3-4 neural foramen. There is also abnormal  paravertebral soft tissue laterally and anteriorly as seen on CT.  Central ventral epidural material posterior to  the T4 vertebral body  may represent tumor or hematoma. Mild T2 hyperintensity is present  in the spinal cord at the T3 level extending inferiorly to the T4-5  disc space level consistent with mild edema. Mild edema is present  in the posterior paraspinal soft tissues of the upper  thoracic  spine. No other vertebral body lesions are identified.  There is a moderate-sized central disc extrusion at T7-8 which  contacts and mildly flattens the ventral spinal cord with mild  spinal stenosis. A small left central disc protrusion is present at  T8-9 with at most minimal impression on the spinal cord. Massive  enlargement of the left thyroid lobe is partially visualized.  Mediastinal and lung findings are better demonstrated on recent  chest CT.  IMPRESSION:  1. T3 vertebral body mass with pathologic compression fracture and  epidural tumor resulting in moderate cord compression with evidence  of mild spinal cord edema.  2. T7-8 disc extrusion resulting in mild spinal stenosis.   MRI Head / C-Spine 9/23:  CLINICAL DATA: 48 year old female who presented with back pain and  lower extremity numbness, with continued inadequately explained  lower extremity weakness following Hospital discharge on 12/12/2013.  Initial encounter.  EXAM:  MRI HEAD WITHOUT AND WITH CONTRAST  MRI CERVICAL SPINE WITHOUT AND WITH CONTRAST  TECHNIQUE:  Multiplanar, multiecho pulse sequences of the brain and surrounding  structures, and cervical spine, to include the craniocervical  junction and cervicothoracic junction, were obtained without and  with intravenous contrast.  CONTRAST: 20 mL MultiHance.  COMPARISON: Lumbar MRI 12/10/2013.  FINDINGS:  MRI HEAD FINDINGS  Cerebral volume is normal. No restricted diffusion to suggest acute  infarction. No midline shift, mass effect, evidence of mass lesion,  ventriculomegaly, extra-axial collection or acute intracranial  hemorrhage. Cervicomedullary junction and  pituitary are within  normal limits. Major intracranial vascular flow voids are preserved.  Pearline Cables and white matter signal is within normal limits for age  throughout the brain. No abnormal enhancement identified, pulsation  artifact prominent on axial post-contrast images.  Large body habitus. Small right frontal scalp lipoma (series 10,  image 37). Visible internal auditory structures appear normal. Mild  right mastoid fluid. Minor paranasal sinus mucosal thickening.  Visualized orbit soft tissues are within normal limits. Partially  visible thyromegaly greater on the left, see below. Normal bone  marrow signal.  MRI CERVICAL SPINE FINDINGS  Large body habitus resulting in wrap artifact on axial images.  However, there is massive thyroid enlargement evident, with the left  lobe measuring at least 7.2 x 5.2 x 11.2 cm (AP by transverse by  CC). The trachea is displaced to the right at the thoracic inlet and  mildly compressed. There is other regional mass effect including  sheath. On the left carotid  Fairly preserved cervical lordosis. No marrow edema or evidence of  acute osseous abnormality. Cervicomedullary junction is within  normal limits. Spinal cord signal is within normal limits at all  visualized levels. No abnormal intradural enhancement.  C2-C3: Negative.  C3-C4: Disc osteophyte complex, facet and ligament flavum  hypertrophy. Along with a degree of congenital canal narrowing,  there is mild spinal stenosis and moderate biforaminal stenosis.  C4-C5: Mild uncovertebral and facet hypertrophy. Mild right C5  foraminal stenosis.  C5-C6: Circumferential disc osteophyte complex. Along with a degree  of congenital canal narrowing there is mild spinal stenosis.  Moderate right C6 foraminal stenosis.  C6-C7: Negative except for mild facet hypertrophy.  C7-T1: Negative.  T1-T2: Negative.  Faintly visualized at the T3 level in the upper thoracic spine there  is a lobulated solidly  enhancing mass which appears to extend into  the epidural space from the T3 vertebral body. This is inseparable  from the spinal cord which is likely compressed. See series 2000  image  7 and series 1500, image 6. This is not included on axial  images.  IMPRESSION:  1. Faintly visible but aggressive appearing enhancing mass at the T3  level of the upper thoracic spine, likely with spinal cord  compression evaluate further are with thoracic MRI without and with  contrast.  2. Normal for age MRI appearance of the brain.  3. Massive thyroid enlargement, greater on the left. Left thyroid  goiter/mass measuring at least 7.2 x 5.2 x 11.2 cm. Mass effect at  the thoracic inlet including on the trachea.  4. Mild chronic disc and endplate degeneration in the cervical spine  at C3-C4 and C5-C6 resulting in borderline to mild spinal stenosis,  but no cervical spinal cord mass effect or signal abnormality.  Electronically Signed:  By: Lars Pinks M.D.  On: 12/14/2013 09:50   MRI L-Spine 9/19:  CLINICAL DATA: Back pain with bilateral leg numbness, worsening  with oral steroids.  EXAM:  MRI LUMBAR SPINE WITHOUT CONTRAST  TECHNIQUE:  Multiplanar, multisequence MR imaging of the lumbar spine was  performed. No intravenous contrast was administered.  COMPARISON: CT 12/09/2013  FINDINGS:  There is no degenerative disease at L2-3 or above. The discs are  normal in signal characteristics and morphology. The spinal canal  and foramina are widely patent. The distal cord and conus are normal  with conus tip at L1. There are small benign and insignificant  hemangiomas within the L2 and L3 vertebral bodies.  L3-4: Minimal desiccation of the disc. Mild circumferential bulging  slightly more prominent towards the left. Bilateral facet  degeneration and hypertrophy. Mild narrowing of the lateral recesses  without apparent neural compression. Mild foraminal narrowing on the  left without apparent neural  compression.  L4-5: Desiccation of the disc with circumferential protrusion.  Bilateral facet and ligamentous hypertrophy. Stenosis of both  lateral recesses without definite focal neural compression. Mild  foraminal narrowing bilaterally.  L5-S1: Desiccation of the disc with loss of height. Endplate  osteophytes and bulging of the disc more prominent towards the left.  Bilateral facet degeneration and hypertrophy left more than right.  Stenosis of the subarticular lateral recess on the left that could  compress the S1 nerve root. Mild foraminal narrowing left more than  right but without apparent compression of the exiting L5 nerve  roots.  Serpiginous fluid-filled structure in the left pelvis could  represent hydrosalpinx. This is not primarily or completely imaged.  IMPRESSION:  Degenerative disc disease and degenerative facet disease in the  lower lumbar spine that could be a cause of lumbago.  Lateral recess and foraminal narrowing at L3-4, L4-5 and L5-S1 as  described above that could be a cause of focal neural compression.  In general, this is more pronounced on the left than on the right.  In particular, there is potential for left S1 nerve root compression  in the subarticular lateral recess at L5-S1.  Electronically Signed  By: Nelson Chimes M.D.  On: 12/10/2013 08:45   CT L-Spine 9/18:  CLINICAL DATA: Low back pain with difficulty walking.  EXAM:  CT LUMBAR SPINE WITHOUT CONTRAST  TECHNIQUE:  Multidetector CT imaging of the lumbar spine was performed without  intravenous contrast administration. Multiplanar CT image  reconstructions were also generated.  COMPARISON: None.  FINDINGS:  There is no fracture or spondylolisthesis. There is moderately  severe disc space narrowing at L5-S1 with slight vacuum phenomenon  at this level. There is moderate narrowing at L4-5. There are  prominent anterior osteophytes at  L2, L3, L4, and L5.  At T12-L1, there is moderate facet  hypertrophy bilaterally. There is  no nerve root edema or effacement. No disc extrusion or stenosis.  At L1-2, there is moderate facet hypertrophy bilaterally. There is  mild diffuse disc bulging. There is no nerve root edema or  effacement. No disc extrusion or stenosis.  At L2-3, there is broad-based disc bulging and moderate facet  hypertrophy bilaterally. There is borderline narrowing of the thecal  sac with borderline spinal stenosis. No disc extrusion.  At L3-4, there is severe facet osteoarthritic change bilaterally,  more pronounced on the left than on the right. There is broad-based  disc bulging and moderate ligamentum flavum hypertrophy. These  findings in concert lead to moderate generalized spinal stenosis.  At L4-5, there is broad-based disc protrusion and severe facet  osteoarthritic change bilaterally. These findings in concert lead to  moderate generalized spinal stenosis. No frank disc extrusion.  At L5-S1, there is severe facet osteoarthritic change on the left  and moderate facet osteoarthritic change on the right. There is  broad-based disc protrusion. There is exit foraminal narrowing  bilaterally, more severe on the left than on the right, due to  osteophytic change. No frank disc extrusion or stenosis is seen at  this level.  There are no paraspinous lesions.  IMPRESSION:  Spinal stenosis at L3-4 and L4-5, multifactorial. Borderline spinal  stenosis at L2-3. There does appear to be a degree of congenital  narrowing of the canal which contributes to spinal stenosis.  Multilevel arthropathy.  No frank disc extrusion.  No fracture or spondylolisthesis.  Electronically Signed  By: Lowella Grip M.D.  On: 12/09/2013 13:50   CXR 9/13:  CLINICAL DATA: Upper back pain for 5 weeks. Dyspnea on exertion.  Nonsmoker. History of hypertension.  EXAM:  CHEST 2 VIEW  COMPARISON: 04/15/2006  FINDINGS:  The heart is mildly enlarged. There is elevation of right   hemidiaphragm, stable in appearance. There is streaky density  favored to be within the right lower lobe, consistent with  atelectasis or infiltrate. No pulmonary edema. No pleural effusions.  There are moderate degenerative changes in the thoracic spine.  IMPRESSION:  Right lower lobe atelectasis or infiltrate.  Electronically Signed  By: Shon Hale M.D.  On: 12/04/2013 19:26  Venous Duplex 9/24:  Summary:  - No evidence of deep vein thrombosis involving the right lower extremity. - No evidence of deep vein thrombosis involving the left lower extremity.   Surgical Pathology Report: 12/21/2013 Diagnosis Soft tissue mass, simple excision, Thoracic Epidural Tumor - METASTATIC CARCINOMA. PLEASE SEE COMMENT. Microscopic Comment There are clusters of malignant cells with abundant eosinophilic cytoplasm. A battery of immunostains were performed and the tumor cells show the following profile: Cytokeratin 7 - strongly positive Cytokeratin 8/18 - strongly positive TTF-1 - strongly positive. Thyroglobulin - negative CD10 - negative Cytokeratin 20 - negative S100 - negative Vimentin - negative The control stained appropriately. The overall findings are diagnostic for metastatic carcinoma. 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 However, a metastatic oncocytic thyroid carcinoma is still in the differential. Clinical and radiographic correlation is highly recommended. (HCL:kh 12-19-13) Aldona Bar MD Pathologist, Electronic Signature (Case signed 12/20/2013  Aquilla Hacker, MD 12/21/2013, 11:52 AM PGY-1, [place] Intern pager: [phone], text pages welcome

## 2013-12-21 NOTE — Progress Notes (Signed)
Seen and examined. Agree with the management and documentation of Dr. Minda Ditto.  Family and patient want aggressive medical care.  We have several issues to balance: 1. Oncology will dictate treatment of underlying cancer. 2. NS will clarify whether she needs spinal stabilization. 3. Somehow we need to fit rehab in to the treatment plan of her underlying cancer.   Family aware

## 2013-12-21 NOTE — Telephone Encounter (Signed)
Called and left vm message to call with appt.  I also call patient's mother but was unable to reach her.

## 2013-12-21 NOTE — Progress Notes (Signed)
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  I stopped in this AM to discuss situation with patient but she did not want to discuss it "this early in the morning." I gave her sister a copy of the pathology and radiology reports.  I have asked our PA to see the patient and create a database today. I will see the patient tonight. The choice will be between palliative/Hospice care for this incurable cancer and treatment with chemotherapy or possibly targeted agents. If the patient chooses treatment we will set her up to see our lung cancer specialist, Dr Julien Nordmann, most likely in the outpatient clinic (we will need special studies sent on the pathology and treatment decisions would have to wait until those are available).

## 2013-12-21 NOTE — Progress Notes (Signed)
Subjective: Patient reports "My back just hurts when I turn or lay in one place too long"  Objective: Vital signs in last 24 hours: Temp:  [97.5 F (36.4 C)-98.6 F (37 C)] 98.6 F (37 C) (09/30 5573) Pulse Rate:  [73-83] 75 (09/30 0642) Resp:  [18-20] 18 (09/30 0642) BP: (129-154)/(73-90) 143/76 mmHg (09/30 0642) SpO2:  [96 %-100 %] 100 % (09/30 0642)  Intake/Output from previous day: 09/29 0701 - 09/30 0700 In: 240 [P.O.:240] Out: 2850 [Urine:2850] Intake/Output this shift:    Alert, reporting intermittent back pain. No change overall, sensation present BLE, motor absent BLE. Pathology "a diagnosis of metastatic adenocarcinoma of lung primary is favored. However, a metastatic oncocytic thyroid carcinoma is still in the differential." discussion attempted with pt this AM by Dr. Jana Hakim, but deferred by patient until family present.   Lab Results:  Recent Labs  12/19/13 0709 12/20/13 0720  WBC 14.3* 18.7*  HGB 12.1 12.9  HCT 35.4* 37.5  PLT 265 306   BMET  Recent Labs  12/20/13 0720 12/21/13 0601  NA 133* 131*  K 4.9 4.6  CL 91* 93*  CO2 28 24  GLUCOSE 245* 263*  BUN 26* 24*  CREATININE 0.77 0.70  CALCIUM 9.2 9.0    Studies/Results: No results found.  Assessment/Plan:   LOS: 9 days  Continue support. Palliative vs aggresive tx decisions pending family discussion.  DrCabbell will be back tomorrow and decision of surgery for stabilization will be discussed. Ok per DrStern for [facility] [facility] po prn, as Percocet appears to be a bit strong & she doesn't tolerate Tramadol.    Poteat, Aaron Edelman 12/21/2013, 8:08 AM    No volitional movement in lower extremities with some preserved sensation, adequate pain control.  To discuss further surgery (stabilization) with Dr. Christella Noa on his return.  Tapering decadron.

## 2013-12-21 NOTE — Progress Notes (Signed)
Physical Therapy Treatment Patient Details Name: Sarah [name] MRN: [id number] DOB: 10-25-1965 Today's Date: 12/21/2013    History of Present Illness 48 y.o. female admitted to [facility] on 12/12/13 with progressive lower extremity weakness and mid to upper back pain.  Pt was found to have a mass at T3 with (+) spinal cord compression.  On 9/23 pt underwent surgery for decompression and biopsy.  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  Her thyroid is very enlarged.  Oncology has been consulted and are awaiting pathology results.  Pt with significant PMHx of HTN and chronic back pain.    PT Comments    Pt with increased reflexive tone in her left leg compared to previous session.  DF ROM continues to be able to get to neutral and heels are floated today for pressure relief.  Pt has been actively self advocating her own pressure relief by calling staff every two hours to be rotated.  PT completed PROM to bil legs.  Education next session will need to focus on family education and family instruction of PROM to bil legs.  PT will continue to follow acutely.    Follow Up Recommendations  CIR     Equipment Recommendations  Wheelchair (measurements PT);Wheelchair cushion (measurements PT);Hospital bed;Other (comment);3in1 (PT) (slide board, hoyer lift, drop arm 3-in-1)    Recommendations for Other Services   NA     Precautions / Restrictions Precautions Precautions: Fall Precaution Comments: bil leg weakness, parapelegia          Cognition Arousal/Alertness: Awake/alert Behavior During Therapy: WFL for tasks assessed/performed Overall Cognitive Status: Within Functional Limits for tasks assessed                      Exercises Other Exercises Other Exercises: PROM to bil legs to further follow and assess strength/tone.  Right leg still has increased flexor tone and adductor tone compared to left leg.  DF bil can still get to neutral and staff has been floating her heels.  Pt reports  she has been good and staff has been responsive to helping her turn every two hours.  Although initial stretching elicits a flexor tone reaction, with repetition, pt's tone decreases.  Pt showed increased flexor tone (still not as much as the right in the left leg today compared to evaluation.  Sensation equal and deminished bil legs and inability to localize touch in her feet.   Other Exercises: PROM ankle DF bil, knee/hip flexion bil, and hip abduction bil x at least 5-10 reps each for ~10-20 second holds.   Other Exercises: Reinforced rolling for pressure relief, floating of heels.          Pertinent Vitals/Pain Pain Assessment: Faces Faces Pain Scale: Hurts little more (with spasms) Pain Location: mid back Pain Descriptors / Indicators: Aching;Burning;Cramping Pain Intervention(s): Limited activity within patient's tolerance;Monitored during session;Repositioned           PT Goals (current goals can now be found in the care plan section) Acute Rehab PT Goals Patient Stated Goal: to get her legs better, figure out a solid dx so that she can get treatment.  Progress towards PT goals: Progressing toward goals    Frequency  Min 3X/week    PT Plan Current plan remains appropriate       End of Session   Activity Tolerance: Patient tolerated treatment well Patient left: in bed;with call bell/phone within reach;with family/visitor present     Time: 1950-9326 PT Time Calculation (  min): 13 min  Charges:  $Therapeutic Exercise: 8-22 mins                      Jerol Rufener B. Kalesha Irving, PT, DPT [phone]   12/21/2013, 4:44 PM

## 2013-12-21 NOTE — Consult Note (Signed)
[place]  Telephone:([phone]) [place] NOTE  Sarah [name]                                MR#: [id number]  DOB: 13-Apr-1965                       CSN#: [id number]  Referring MD: Dr. Sheliah Plane [facility]  Primary MD: No PCP Per Patient   Reason for Consult:  Metastatic Lung Cancer   LMB:EMLJQ [name] is a 48 y.o. [place], [place] woman who presented with progressive weakness and numbness of lower extremities starting on 12/07/2013 .She was seen in emergency room at [facility] on 9/18. CT scan of the lumbar spine was unremarkable. She was given Vicodin and diazepam as well as Toradol and discharged home. As she was unable to ambulate, she was seen again at hospital, and admitted to [facility] for further evaluation. MRI of the lumbosacral spine showed degenerative arthritic and disc changes but no acute abnormality. She had no bowel or bladder control changes. She had full range of motion on the upper extremities. Neurological evaluation was obtained on 9/19 without clear explanation for her bilateral lower extremity weakness such as cauda equina syndrome, or acute myelopathy. She was discharged after ambulating with physical therapy on 9/21. However, after a short car ride to her home, she was unable to stand, or walk. She was brought back by ambulance back to [facility] for admission. This time, she was unable to void or to pass stool.  A cervical  and thoracic spine MRI on 9/23 revealed a lesion at the T3 level of the upper thoracic spine with pathologic compression fracture and epidural tumor resulting in moderate cord compression with evidence of mild spinal cord edema. CT of the chest, abdomen and pelvis with contrast on 9/23 confirmed the destructive T3 metastasis nearly completely replacing the vertebral body and causing significant spinal cord compression with epidural extension of tumor. A 3.0 x 2.7 x 2.2 cm  right suprahilar mass with  smaller bilateral pulmonary nodules and mediastinal lymph nodes in the right paratracheal region, prevascular space and AP window  consistent with metastatic disease were seen. A massive thyroid goiter measuring 7.7 x 8.5 cm causing tracheal deviation to the right and narrowing of the trachea was seen.  Moreover, Acute nonocclusive pulmonary embolism in the right middle lobe and right lower lobe pulmonary arteries was noted. MRI of the brain was negative for metastatic disease. Lower extremity Venous Duplex 12/15/13 negative for DVT.  She underwent  thoracic laminectomy with excision of the epidural tumor on 9/23 with path report, case number [id number] (Dr. Nicoletta Dress) consistent with Metastatic Carcinoma.  Immunostains were performed,  strongly positive for CK 7, CK 8/18, TTF-1, and negative for  Thyroglobulin, CD-10, CK 20, S-100, Vimentin, thus favoring metastatic adenocarcinoma of lung primary. No tumor markers were performed to date. She may be scheduled for stabilization surgery by Dr. Cyndy Freeze in the near future.  Today, she continues to have bilateral paralysis. She has regained some sensation on both lower extremities and pelvis, thus, she can now urinate and pass stool on her own.  No other neurological abnormalities noted. She wishes to discuss her options from the oncological standpoint.      PMH:  Past Medical History  Diagnosis Date   Hypertension    DDD (degenerative  disc disease), cervical    DJD (degenerative joint disease)    Obesity    Chronic back pain    Menorrhagia, chronic/suspected PCOS per chart report  History of rotator cuff tear  Goiter, Massive, chronic  Surgeries:  Past Surgical History  Procedure Laterality Date   Tonsillectomy     Laminectomy N/A 12/14/2013    Procedure: THORACIC LAMINECTOMY FOR TUMOR THORACIC THREE;  Surgeon: Ashok Pall, MD;  Location: [place];  Service: Neurosurgery;  Laterality: N/A;  THORACIC LAMINECTOMY FOR TUMOR THORACIC THREE     Allergies:  Allergies  Allergen Reactions   Tramadol Nausea And Vomiting    Medications:  Scheduled Meds:  antiseptic oral rinse  7 mL Mouth Rinse BID   dexamethasone  4 mg Oral 3 times per day   Influenza vac split quadrivalent PF  0.5 mL Intramuscular Tomorrow-1000   insulin aspart  0-20 Units Subcutaneous TID WC   insulin aspart  0-5 Units Subcutaneous QHS   losartan  12.5 mg Oral Daily   polyethylene glycol  17 g Oral Daily   senna  1 tablet Oral BID   sodium chloride  3 mL Intravenous Q12H   Continuous Infusions:  sodium chloride 250 mL (12/15/13 0700)   sodium chloride 100 mL/hr at 12/21/13 0008   heparin 1,500 Units/hr (12/20/13 1823)   PRN Meds:.acetaminophen, acetaminophen, albuterol, diazepam, HYDROcodone-acetaminophen, menthol-cetylpyridinium, ondansetron (ZOFRAN) IV, oxyCODONE-acetaminophen, phenol, polyethylene glycol, sodium chloride  ROS: Constitutional: Denies fevers, chills. She is perimenopausal, with occasional night sweats. Eyes: Denies blurriness of vision, double vision or watery eyes Ears, nose, mouth, throat, and face: Denies mucositis or sore throat. No dysphagia depite large goiter. Respiratory: Denies cough, dyspnea or wheezes. No hemoptysis.  Cardiovascular: Denies palpitation, chest discomfort or lower extremity swelling Gastrointestinal:  Denies nausea, heartburn. Bowel dysfunction with constipation since admission.  Skin: Denies abnormal skin rashes Lymphatics: Denies new lymphadenopathy or easy bruising Neurological: sensation present in both lower extremities since decompression. Unable to move extremities bilaterally. Musculoskeletal: positive for back pain with movement.  Behavioral/Psych: Mood is stable, no new changes  All other systems were reviewed with the patient and are negative.   Family History:    Family History  Problem Relation Age of Onset   Hypertension Mother    Diabetes Mother    Hypertension Father     Stroke Father   The patient's parents are living-- her mother works in our local ENT office (with Dr Erik Obey). The patient has 2 sisters, no brothers. There is no history of lung cancer in the family (there is an aunt with breast cancer diagnosed in her 34's and the paternal grandmother had colon cancer in her 71's)  GYN: menarche age 89; periods always very irregular, reason never ascertained. GX P0  Social History: Lives in [place], [place]. She is single.. Lives with one of her sisters and her sister's husband. Works at [employer].   ADVANCED DIRECTIVES: [name] IN PROCESS OF NAMING HER SISTER [name] AS HCPOA; [name] CAN BE REACHED AT [phone]. THE PATIENT'S MOTHER [name] Prothero IS SECOND-- [phone]    . reports that she has never smoked, but she is second hand somer. She does not have any smokeless tobacco history on file. She reports that she does not drink alcohol or use illicit drugs.She is Psychologist, forensic. worked at [employer] until recently, prior, she worked in a Research officer, trade union.Marena Chancy is she had asbestos exposure.  Health maintenance: Last mammogram on 10/19/12, normal. last PAP smear in 1989. Not up to date  with physical exams or screening tests. last flu vaccine on 11/22/13   Physical Exam   ECOG PERFORMANCE STATUS: 4 Bedbound (Completely disabled. Cannot carry on any self-care. Totally confined to bed or chair)    Filed Vitals:   12/21/13 0642  BP: 143/76  Pulse: 75  Temp: 98.6 F (37 C)  Resp: 18   Filed Weights   12/12/13 2138 12/13/13 0351 12/15/13 0300  Weight: 315 lb (142.883 kg) 312 lb 9.6 oz (141.794 kg) 313 lb 15 oz (142.4 kg)    GENERAL: 48 year old [name] female, alert, no distress and comfortable. Cushingoid appearance SKIN: skin color, texture, turgor are normal, no rashes or significant lesions. Hirsutism noted. EYES: normal, conjunctiva are pink and non-injected, sclera clear OROPHARYNX:no exudate, no erythema and lips, buccal mucosa, and tongue  normal  NECK: supple, massive goiter palpated, Left greater than right, non-tender, without nodularity LYMPH:  no palpable lymphadenopathy in the cervical, axillary or inguinal LUNGS: clear to auscultation and percussion with normal breathing effort HEART: regular rate & rhythm and no murmurs and no lower extremity edema ABDOMEN: abdomen soft, morbidly obese, non-tender and normal bowel sounds Musculoskeletal:no cyanosis of digits and no clubbing  PSYCH: alert & oriented x 3 with fluent speech NEURO: bilateral lower extremity paralysis in both lower extremities, 1/5 on the right, none on the left. Sensation present.  hyperreflexia noted bilaterally. other tests as per Neurosurgery  CBC  Recent Labs Lab 12/16/13 1025 12/17/13 0324 12/18/13 0635 12/19/13 0709 12/20/13 0720  WBC 12.3* 12.0* 11.8* 14.3* 18.7*  HGB 10.4* 10.6* 10.9* 12.1 12.9  HCT 31.1* 31.2* 32.2* 35.4* 37.5  PLT 239 268 264 265 306  MCV 85.7 84.3 84.1 85.3 83.0  MCH 28.7 28.6 28.5 29.2 28.5  MCHC 33.4 34.0 33.9 34.2 34.4  RDW 13.9 13.8 13.7 13.6 13.8  LYMPHSABS 1.1 1.4 1.6 1.9 1.8  MONOABS 0.5 0.5 1.0 1.0 1.2*  EOSABS 0.0 0.0 0.0 0.0 0.0  BASOSABS 0.0 0.0 0.0 0.0 0.0    Anemia panel:  No results found for this basename: VITAMINB12, FOLATE, FERRITIN, TIBC, IRON, RETICCTPCT,  in the last 72 hours  CMP    Recent Labs Lab 12/15/13 0314 12/17/13 0324 12/18/13 0635 12/19/13 0709 12/20/13 0720 12/21/13 0601  NA 136* 136* 137 135* 133* 131*  K 4.5 4.7 4.7 5.0 4.9 4.6  CL 97 98 97 93* 91* 93*  CO2 _0 GLUCOSE 216* 191* 199* 226* 245* 263*  BUN _1 26* 24*  CREATININE 1.08 0.78 0.85 0.87 0.77 0.70  CALCIUM 8.5 8.8 8.8 9.1 9.2 9.0  AST 30  --   --   --   --   --   ALT 19  --   --   --   --   --   ALKPHOS 118*  --   --   --   --   --   BILITOT 1.4*  --   --   --   --   --         Component Value Date/Time   BILITOT 1.4* 12/15/2013 0314     Imaging Studies:  Dg Chest 2  View  12/04/2013   CLINICAL DATA:  Upper back pain for 5 weeks. Dyspnea on exertion. Nonsmoker. History of hypertension.  EXAM: CHEST  2 VIEW  COMPARISON:  04/15/2006  FINDINGS: The heart is mildly enlarged. There is elevation of right hemidiaphragm, stable in appearance. There is streaky density  favored to be within the right lower lobe, consistent with atelectasis or infiltrate. No pulmonary edema. No pleural effusions. There are moderate degenerative changes in the thoracic spine.  IMPRESSION: Right lower lobe atelectasis or infiltrate.   Electronically Signed   By: Shon Hale M.D.   On: 12/04/2013 19:26   Ct Chest W Contrast  12/14/2013     COMPARISON:  MRI of the cervical spine earlier today.  FINDINGS: CT CHEST FINDINGS  Within the chest, a right suprahilar mass measures approximately 3.0 x 2.7 x 2.2 cm. This either represents a nodal metastasis or central lung mass such as small cell carcinoma. A number of smaller mediastinal lymph nodes are also identified in the right paratracheal region, prevascular space and AP window.  Destructive lesion of the T3 vertebral body noted which nearly replaces the entire vertebral body with tumor extension posteriorly into the epidural space causing significant compression of the spinal cord. Paravertebral extension of tumor is also suspected lateral and anterior to the vertebral body, right greater than left.  Massive enlargement of the thyroid gland present with transverse dimensions of approximately 7.7 x 8.5 cm. The thyroid gland is incompletely visualized as the neck was not included. Central dystrophic calcification identified. The enlarged thyroid gland does cause significant mass effect on the airway with evidence of tracheal deviation to the right and tracheal narrowing at the thoracic inlet. The enlarged thyroid gland extends into the superior mediastinum.  There is evidence of pulmonary embolism with nonocclusive thrombus identified in the right middle lobe  and lower lobe pulmonary arteries. Pulmonary arterial opacification is not optimal on the standard venous phase evaluation of the chest but the right sided pulmonary embolism is clearly present.  There are small bilateral pulmonary nodules. Right-sided pulmonary nodule measuring 6 mm likely localizes to the very top of the lower lobe near the major fissure. Left upper lobe nodule measures 7 mm. Lingular nodule measures 7 mm. Left lower lobe nodule measures 7 mm. Faint peripheral right middle lobe nodule measures 3 mm. Inferior right middle lobe nodule measures 6 mm. Several small nodules in the posterior right upper lobe identified in the 3-4 mm range.  No evidence of pleural or pericardial fluid. No other bony lesions identified in the thorax.    IMPRESSION: 1. 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 2. 3 cm right suprahilar mass may represent nodal metastasis versus primary central tumor. Next number multiple small bilateral pulmonary nodules consistent with metastatic disease. 3. Acute nonocclusive pulmonary embolism in the right middle lobe and right lower lobe pulmonary arteries. 4. Massive thyroid goiter causing tracheal deviation to the right and narrowing of the trachea.   Ct Lumbar Spine Wo Contrast  12/09/2013     COMPARISON:  None.  FINDINGS: There is no fracture or spondylolisthesis. There is moderately severe disc space narrowing at L5-S1 with slight vacuum phenomenon at this level. There is moderate narrowing at L4-5. There are prominent anterior osteophytes at L2, L3, L4, and L5.  At T12-L1, there is moderate facet hypertrophy bilaterally. There is no nerve root edema or effacement. No disc extrusion or stenosis.  At L1-2, there is moderate facet hypertrophy bilaterally. There is mild diffuse disc bulging. There is no nerve root edema or effacement. No disc extrusion or  stenosis.  At L2-3, there is broad-based disc bulging and moderate facet hypertrophy bilaterally. There is borderline narrowing of the thecal  sac with borderline spinal stenosis. No disc extrusion.  At L3-4, there is severe facet osteoarthritic change bilaterally, more pronounced on the left than on the right. There is broad-based disc bulging and moderate ligamentum flavum hypertrophy. These findings in concert lead to moderate generalized spinal stenosis.  At L4-5, there is broad-based disc protrusion and severe facet osteoarthritic change bilaterally. These findings in concert lead to moderate generalized spinal stenosis. No frank disc extrusion.  At L5-S1, there is severe facet osteoarthritic change on the left and moderate facet osteoarthritic change on the right. There is broad-based disc protrusion. There is exit foraminal narrowing bilaterally, more severe on the left than on the right, due to osteophytic change. No frank disc extrusion or stenosis is seen at this level.  There are no paraspinous lesions.  IMPRESSION: Spinal stenosis at L3-4 and L4-5, multifactorial. Borderline spinal stenosis at L2-3. 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  Multilevel arthropathy.  No frank disc extrusion.  No fracture or spondylolisthesis.   Electronically Signed   By: Lowella Grip M.D.   On: 12/09/2013 13:50   Mr Jeri Cos [place] Contrast  12/14/2013   ADDENDUM REPORT: 12/14/2013 10:07  ADDENDUM: Critical Value/emergent results were called by telephone at the time of interpretation on 12/14/2013 at 954 hr to [facility] Dr. Minda Ditto who verbally acknowledged these results.   Electronically Signed   By: Lars Pinks M.D.   On: 12/14/2013 10:07     MRI HEAD WITHOUT AND WITH CONTRAST  MRI CERVICAL SPINE WITHOUT AND WITH CONTRAST  TECHNIQUE: Multiplanar, multiecho pulse sequences of the brain and surrounding structures, and cervical spine, to include the  craniocervical junction and cervicothoracic junction, were obtained without and with intravenous contrast. COMPARISON:  Lumbar MRI 12/10/2013.  FINDINGS: MRI HEAD FINDINGS  Cerebral volume is normal. No restricted diffusion to suggest acute infarction. No midline shift, mass effect, evidence of mass lesion, ventriculomegaly, extra-axial collection or acute intracranial hemorrhage. Cervicomedullary junction and pituitary are within normal limits. Major intracranial vascular flow voids are preserved.  Pearline Cables and [name] matter signal is within normal limits for age throughout the brain. No abnormal enhancement identified, pulsation artifact prominent on axial post-contrast images.  Large body habitus. Small right frontal scalp lipoma (series 10, image 37). Visible internal auditory structures appear normal. Mild right mastoid fluid. Minor paranasal sinus mucosal thickening. Visualized orbit soft tissues are within normal limits. Partially visible thyromegaly greater on the left, see below. Normal bone marrow signal.    IMPRESSION: 1. Faintly visible but aggressive appearing enhancing mass at the T3 level of the upper thoracic spine, likely with spinal cord compression evaluate further are with thoracic MRI without and with contrast. 2. Normal for age MRI appearance of the brain. 3. Massive thyroid enlargement, greater on the left. Left thyroid goiter/mass measuring at least 7.2 x 5.2 x 11.2 cm. Mass effect at the thoracic inlet including on the trachea. 4. Mild chronic disc and endplate degeneration in the cervical spine at C3-C4 and C5-C6 resulting in borderline to mild spinal stenosis, but no cervical spinal cord mass effect or signal abnormality.  Electronically Signed: By: Lars Pinks M.D. On: 12/14/2013 09:50   Mr Thoracic Spine Wo Contrast  12/14/2013     COMPARISON:  Cervical spine MRI and chest CT earlier today  FINDINGS: There is a T3 compression fracture with approximately 50% height loss centrally. As  described on cervical spine MRI and chest CT earlier  today, there is abnormal soft tissue replacing the majority of the T3 vertebral body extending into the pedicles as well as into the ventral epidural space with moderate cord compression. Epidural extension is slightly greater on the right and extends into the inferior aspect of the right T2-3 neural foramen as well as superior aspect of the right T3-4 neural foramen. There is also abnormal paravertebral soft tissue laterally and anteriorly as seen on CT. Central ventral epidural material posterior to the T4 vertebral body may represent tumor or hematoma. Mild T2 hyperintensity is present in the spinal cord at the T3 level extending inferiorly to the T4-5 disc space level consistent with mild edema. Mild edema is present in the posterior paraspinal soft tissues of the upper thoracic spine. No other vertebral body lesions are identified.  There is a moderate-sized central disc extrusion at T7-8 which contacts and mildly flattens the ventral spinal cord with mild spinal stenosis. A small left central disc protrusion is present at T8-9 with at most minimal impression on the spinal cord. Massive enlargement of the left thyroid lobe is partially visualized. Mediastinal and lung findings are better demonstrated on recent chest CT.  IMPRESSION: 1. T3 vertebral body mass with pathologic compression fracture and epidural tumor resulting in moderate cord compression with evidence of mild spinal cord edema. 2. T7-8 disc extrusion resulting in mild spinal stenosis.   Electronically Signed   By: Logan Bores   On: 12/14/2013 19:26   Mr Lumbar Spine Wo Contrast  12/10/2013      COMPARISON:  CT 12/09/2013  FINDINGS: There is no degenerative disease at L2-3 or above. The discs are normal in signal characteristics and morphology. The spinal canal and foramina are widely patent. The distal cord and conus are normal with conus tip at L1. There are small benign and insignificant  hemangiomas within the L2 and L3 vertebral bodies.  L3-4: Minimal desiccation of the disc. Mild circumferential bulging slightly more prominent towards the left. Bilateral facet degeneration and hypertrophy. Mild narrowing of the lateral recesses without apparent neural compression. Mild foraminal narrowing on the left without apparent neural compression.  L4-5: Desiccation of the disc with circumferential protrusion. Bilateral facet and ligamentous hypertrophy. Stenosis of both lateral recesses without definite focal neural compression. Mild foraminal narrowing bilaterally.  L5-S1: Desiccation of the disc with loss of height. Endplate osteophytes and bulging of the disc more prominent towards the left. Bilateral facet degeneration and hypertrophy left more than right. Stenosis of the subarticular lateral recess on the left that could compress the S1 nerve root. Mild foraminal narrowing left more than right but without apparent compression of the exiting L5 nerve roots.  Serpiginous fluid-filled structure in the left pelvis could represent hydrosalpinx. This is not primarily or completely imaged.  IMPRESSION: Degenerative disc disease and degenerative facet disease in the lower lumbar spine that could be a cause of lumbago.  Lateral recess and foraminal narrowing at L3-4, L4-5 and L5-S1 as described above that could be a cause of focal neural compression. In general, this is more pronounced on the left than on the right. In particular, there is potential for left S1 nerve root compression in the subarticular lateral recess at L5-S1.   Electronically Signed   By: Nelson Chimes M.D.   On: 12/10/2013 08:45   Mr Cervical Spine W Wo Contrast  12/14/2013   ADDENDUM REPORT: 12/14/2013 10:07  ADDENDUM: Critical Value/emergent results were called by telephone at the time of interpretation on 12/14/2013 at 954 hr  to [employer] Dr. Minda Ditto who verbally acknowledged these results.   Electronically Signed   By: Lars Pinks  M.D.   On: 12/14/2013 10:07    FINDINGS  Large body habitus resulting in wrap artifact on axial images. However, there is massive thyroid enlargement evident, with the left lobe measuring at least 7.2 x 5.2 x 11.2 cm (AP by transverse by CC). The trachea is displaced to the right at the thoracic inlet and mildly compressed. There is other regional mass effect including sheath. On the left carotid  Fairly preserved cervical lordosis. No marrow edema or evidence of acute osseous abnormality. Cervicomedullary junction is within normal limits. Spinal cord signal is within normal limits at all visualized levels. No abnormal intradural enhancement.  C2-C3:  Negative.  C3-C4: Disc osteophyte complex, facet and ligament flavum hypertrophy. Along with a degree of congenital canal narrowing, there is mild spinal stenosis and moderate biforaminal stenosis.  C4-C5: Mild uncovertebral and facet hypertrophy. Mild right C5 foraminal stenosis.  C5-C6: Circumferential disc osteophyte complex. Along with a degree of congenital canal narrowing there is mild spinal stenosis. Moderate right C6 foraminal stenosis.  C6-C7:  Negative except for mild facet hypertrophy.  C7-T1:  Negative.  T1-T2: Negative.  Faintly visualized at the T3 level in the upper thoracic spine there is a lobulated solidly enhancing mass which appears to extend into the epidural space from the T3 vertebral body. This is inseparable from the spinal cord which is likely compressed. See series 2000 image 7 and series 1500, image 6. This is not included on axial images.  IMPRESSION: 1. Faintly visible but aggressive appearing enhancing mass at the T3 level of the upper thoracic spine, likely with spinal cord compression evaluate further are with thoracic MRI without and with contrast. 2. Normal for age MRI appearance of the brain. 3. Massive thyroid enlargement, greater on the left. Left thyroid goiter/mass measuring at least 7.2 x 5.2 x 11.2 cm. Mass effect at the  thoracic inlet including on the trachea. 4. Mild chronic disc and endplate degeneration in the cervical spine at C3-C4 and C5-C6 resulting in borderline to mild spinal stenosis, but no cervical spinal cord mass effect or signal abnormality.  Electronically Signed: By: Lars Pinks M.D. On: 12/14/2013 09:50   Ct Abdomen Pelvis W Contrast  12/14/2013  FINDINGS  Small low-density lesion in the left lobe of the liver measures 7 mm and likely represents a cyst. No other hepatic abnormalities. The gallbladder, pancreas, spleen, adrenal glands and kidneys are unremarkable. No adenopathy is identified in the abdomen or pelvis. Bowel shows normal caliber without evidence of obstruction, lesion or inflammation. No abnormal fluid collections.  The bladder is moderately distended. Elongated cystic changes in both adnexal regions identified which may represent a combination of ovarian cysts and potentially hydrosalpinx. Correlation with pelvic ultrasound is recommended. No hernias are identified. The uterus appears unremarkable by CT. No bony lesions are identified in the abdomen or pelvis.  IMPRESSION: Tubular cystic abnormalities of both adnexal regions which may be a combination of cystic changes in the ovaries and potentially hydrosalpinx. Correlation recommended with pelvic ultrasound to exclude any suspicious cystic lesions of the adnexae. 6. Critical Value/emergent results were called by telephone at the time of interpretation on 12/14/2013 at 4:52 pm to Dr. Tamala Julian of the [facility] Medicine Teaching Service, who verbally acknowledged these results.   Electronically Signed   By: Aletta Edouard M.D.   On: 12/14/2013 16:53    12/19/2013   : Fluoroscopy  was utilized by the requesting physician. No radiographic interpretation.   Electronically Signed   By: Porfirio Mylar   On: 12/19/2013 15:18   Patient: Sarah [name] Collected: 12/14/2013 Client: [place] Accession: [id number] Received: 12/15/2013 Ashok Pall DOB: 07/10/1965 Age: 79 Gender: F Reported: 12/20/2013 [address] Patient Ph: [phone] MRN #: [id number] [place], [place] [address] Visit #: [id number] Chart #: Phone:  Fax: CC: Idelle Jo, MD REPORT OF SURGICAL PATHOLOGY FINAL DIAGNOSIS Diagnosis Soft tissue mass, simple excision, Thoracic Epidural Tumor - METASTATIC CARCINOMA. PLEASE SEE COMMENT. Microscopic Comment There are clusters of malignant cells with abundant eosinophilic cytoplasm. A battery of immunostains were performed and the tumor cells show the following profile: Cytokeratin 7 - strongly positive Cytokeratin 8/18 - strongly positive TTF-1 - strongly positive. Thyroglobulin - negative CD10 - negative Cytokeratin 20 - negative S100 - negative Vimentin - negative The control stained appropriately. The overall findings are diagnostic for metastatic carcinoma. 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 However, a metastatic oncocytic thyroid carcinoma is still in the differential. Clinical and radiographic correlation is highly recommended. (HCL:kh 12-19-13)  Aldona Bar MD Pathologist, Electronic Signature (Case signed 12/20/2013)   A/P: 48 y.o. 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 She was found to have T-3 level vertebral mass with spinal compression.  CT of the chest, abdomen and pelvis  showed a right suprahilar mass with smaller bilateral pulmonary nodules and mediastinal lymph nodes in the right paratracheal region, prevascular space and AP window, consistent with malignancy. A massive thyroid goiter causing tracheal deviation to the right and narrowing of the trachea was visualized as well. She underwent decompression and  biopsy on 9/23, with diagnosis of adenocarcinoma, likely of lung primary. She continues to have paralysis, with some sensation present in the lower extremities.  The choice will be between palliative/Hospice care for this incurable cancer and treatment with chemotherapy or possibly targeted agents. If the patient chooses treatment we will set her up to see our lung cancer specialist, Dr Julien Nordmann, most likely in the outpatient clinic (we will need special studies sent on the pathology and treatment decisions would have to wait until those are available).  Acute, non occlusive DVT in the setting of malignancy and morbid obesity, decreased mobility. On Heparin per Pharmacy She will need to bridge to oral anticoagulants upon discharge  Leukocytosis Reactive, as she is on steroids. No intervention recommended other than observation  Thyroid Goiter, massive Negative for Thyroglobulin in stains, TSH normal May need Endocrine evaluation once stable from Neuro and Oncology standpoint.   Other medical issues as per admitting team.  Thank you for the consultation.   Rondel Jumbo, PA-C 12/21/2013 8:17 AM  ADDENDUM: I met with the patient and her family (both parents, both sisters, cousins and in-laws) for approximately one hour to review her situation. I have supplemented the Social, GYN and Family History above and discussed advanced directives. In brief:  48 y.o. Sarah [name] woman nonsmoker diagnosed with non-small-cell, non-squamous cell lung cancer metastatic to T3, as well as other sites, with cord compression and resulting paralysis.  We discussed the different kinds of cancer and the different ways they are treated. The patient and family understand surgery and radiation  are local forms of treatment and can be useful but that this is a system-wide problem and requires systemic therapy.   I believe we are dealing with a lung adenocarcinoma in a young nonsmoking woman and many of these cancers  exhibit mutations in EGFR or ALK which can lead to targeted therapies. If those mutations are not present, then chemotherapy lengthens life, but at the cost of multiple side-effects which can significantly compromise quality of life.   Unfortunately no one knows how to cure stage IV lung cancer. Accordingly the choice of a palliative approach was discussed, with goals being comfort, safety, and keeping the patient as fit as possible as long as possible.Olegario Shearer is very clear she "wants to fight" and would be willing to receive chemotherapy if that was the only systemic treatment available.  Accordingly I suggest: (1) ask pathology to check for actionable mutations (done) (2) biopsy of the patient's goiter to rule out the unlikely possibility of an oncocytoma (order entered) (3) spine stabilization at the discretion of [facility] (4) patient will need Rehab and significant support at home, to be arranged  The patient intends to live in [place]. She may wish to be treated here, in [facility], in [place] (under Dr Everardo All) or at [facility]. At this point they are considering here and [facility]. If the patient decides to be treated here she will be seen by our lung cancer specialist, Dr Julien Nordmann (the patient's family had already heard about him from other sources).  I will continue to follow with you until a final disposition is established. Please let me know if I can be of further help at this point.    I personally saw this patient and performed a substantive portion of this encounter with the listed APP documented above.   Chauncey Cruel, MD

## 2013-12-21 NOTE — Progress Notes (Signed)
Family Medicine Teaching Service  Dr. Domenic Schwab and I had a long discussion with Ms. Calixto and her family around 3pm about her pathology results, and their overall goals given her diagnosis of metastatic cancer. They were informed that it is very likely that she has metastatic lung cancer vs. A much lower chance of thyroid cancer. We then discussed Ms. Flinchum's overall goals with her current diagnosis. She and her family agree that they would like to proceed with aggressive care, and they are waiting to discuss their options as well as likely prognosis of this diagnosis with the oncology team. Family and Ms. Toney Rakes are appreciative of our discussion, and they are taking time to process this diagnosis and the abrupt change in life course that this means for her. We will continue to follow and offer medical and emotional support as we need to in this unfortunate situation.   Paula Compton, MD [facility] - PGY 1

## 2013-12-21 NOTE — Progress Notes (Addendum)
ANTICOAGULATION CONSULT NOTE - Follow Up Consult  Pharmacy Consult:  Heparin Indication: pulmonary embolus  Allergies  Allergen Reactions  . Tramadol Nausea And Vomiting    Patient Measurements: Height: 5\' 11"  (180.3 cm) Weight: 313 lb 15 oz (142.4 kg) IBW/kg (Calculated) : 70.8 Heparin Dosing Weight: 104 kg  Vital Signs: Temp: 98.6 F (37 C) (09/30 0642) Temp src: Oral (09/30 0642) BP: 143/76 mmHg (09/30 0642) Pulse Rate: 75 (09/30 0642)  Labs:  Recent Labs  12/19/13 0709 09/Sarah/15 0720 12/21/13 0601  HGB 12.1 12.9  --   HCT 35.4* 37.5  --   PLT 265 306  --   HEPARINUNFRC 0.62 0.56 0.52  CREATININE 0.87 0.77 0.70    Estimated Creatinine Clearance: 136.4 ml/min (by C-G formula based on Cr of 0.7).   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 She had surgery on 12/15/13 for decompression and biopsy.  Found to have a PE on CT chest and started on IV heparin 48 hours post-operatively.  Lower extremity Venous Duplex 12/15/13 negative for DVT.  Hypercoagulable state likely from oncologic causes. Heparin level today = 0.52, remains therapeutic on 1500 units/hr IV. Hgb stable at 12.9 and pltc stable at 306K. No bleeding reported.  Goal of Therapy:  Heparin level 0.3-0.7 units/ml Monitor platelets by anticoagulation protocol: Yes    Plan:  1. Continue heparin gtt at 1500 units/hr 2. Daily HL / CBC 3. Follow up plans for long term anticoagulation decision-- note if decision is made to bridge patient to warfarin, she will need a minimum of 5 DAYS of overlap treatment with a parenteral agent.  Shiheem Corporan D. Kadyn Guild, PharmD, BCPS Clinical Pharmacist Pager: [phone] 12/21/2013 9:05 AM

## 2013-12-22 ENCOUNTER — Inpatient Hospital Stay (HOSPITAL_COMMUNITY): Payer: Medicaid Other

## 2013-12-22 ENCOUNTER — Telehealth: Payer: Self-pay | Admitting: *Deleted

## 2013-12-22 DIAGNOSIS — Z8639 Personal history of other endocrine, nutritional and metabolic disease: Secondary | ICD-10-CM

## 2013-12-22 LAB — GLUCOSE, CAPILLARY
GLUCOSE-CAPILLARY: 231 mg/dL — AB (ref 70–99)
GLUCOSE-CAPILLARY: 311 mg/dL — AB (ref 70–99)
Glucose-Capillary: 231 mg/dL — ABNORMAL HIGH (ref 70–99)
Glucose-Capillary: 233 mg/dL — ABNORMAL HIGH (ref 70–99)

## 2013-12-22 LAB — CBC
HCT: 36.8 % (ref 36.0–46.0)
Hemoglobin: 12.5 g/dL (ref 12.0–15.0)
MCH: 28.4 pg (ref 26.0–34.0)
MCHC: 34 g/dL (ref 30.0–36.0)
MCV: 83.6 fL (ref 78.0–100.0)
PLATELETS: 265 10*3/uL (ref 150–400)
RBC: 4.4 MIL/uL (ref 3.87–5.11)
RDW: 14.2 % (ref 11.5–15.5)
WBC: 17.7 10*3/uL — ABNORMAL HIGH (ref 4.0–10.5)

## 2013-12-22 LAB — HEPARIN LEVEL (UNFRACTIONATED): Heparin Unfractionated: 0.3 IU/mL (ref 0.30–0.70)

## 2013-12-22 LAB — APTT: aPTT: 48 s — ABNORMAL HIGH (ref 24–37)

## 2013-12-22 LAB — PROTIME-INR
INR: 1.03 (ref 0.00–1.49)
Prothrombin Time: 13.5 s (ref 11.6–15.2)

## 2013-12-22 NOTE — Progress Notes (Signed)
Chaplain initiated visit after morning progression report. Pt sleeping and family member said "come in." Chaplain noticed pt and family wanting to rest so chaplain said she would come back at another time.   12/22/13 0900  Clinical Encounter Type  Visited With Patient and family together  Visit Type Spiritual support  Sarah [name] 12/22/2013 9:41 AM

## 2013-12-22 NOTE — Progress Notes (Signed)
Patient ID: Sarah [name], female   DOB: [birth year], 48 y.o.   MRN: [id number] BP 140/82  Pulse 88  Temp(Src) 97.7 F (36.5 C) (Oral)  Resp 18  Ht 5\' 11"  (1.803 m)  Wt 142.4 kg (313 lb 15 oz)  BMI 43.80 kg/m2  SpO2 96%  LMP 11/28/2013 No change in exam.  Will try for or next week at the earliest.

## 2013-12-22 NOTE — Progress Notes (Signed)
Family Medicine Teaching Service Daily Progress Note Intern Pager: [phone]  Patient name: Sarah [name] Medical record number: [id number] Date of birth: [birth year] Age: 48 y.o. Gender: female  Primary Care Provider: No PCP Per Patient Consultants: Neurosurgery, IR, Oncology Code Status: Full  Pt Overview and Major Events to Date:  - 9/22: Pt. Admitted with lower extremity weakness.  - 9/23: Pt. Found to have mass at T-3 level with spinal compression.  - 9/24: Surgery performed for decompression and biopsy overnight. Pt. Post-op and doing well. 3cm Suprahilar mass on CT Chest, PE also identified. ?Thyroid etiology, but much less likely for mets. Oncology consulted.  - 9/25 - 9/29: Awaiting pathology results with plan for oncology to see once path returns. Pt. With little neurological improvement. Started with light PT.  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 Oncology on board. Neurosurgery to comment on need for spinal stabilization surgery.  - 10/1: Radiology to perform thyroid US; thyroid bx tomorrow  Assessment and Plan: Sarah [name] is a 48 y.o. female presenting with continued lower extremity weakness at baseline after discharge from hospital on 9/21. PMH is significant for obesity, HTN, and ?History of Hypothyroidism previously on Synthroid per patient.   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 Considering Mets vs. Primary tumor. Pulmonary lesion noted on CT as well as Large thyroid mass ? Goiter. Family history and social history on further evaluation reveal little in the way of risk factors. Subsequent CT chest, abdomen, pelvis with 3cm R Suprahilar pulmonary nodule and multiple smaller nodules. Neurosurgery took her to the OR on 9/23, and biopsied as well as  performed laminectomy for decompression. - PT recommendations SNF with 24hour supervision. Will plan dispo based on goals of care discussion.  - Discussed Recs for PT with Neurosurgery. Light PT at this point. Pt. Not to get UOB.  - Decadron taper initiated 58mQID > 422mTID > 79m8mID for now.  - Large thyroid goiter noted. TSH normal, but likely needs ultrasound (10/1), Biopsy for 10/2 via IR. 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 Oncology on board.  - flexeril, oxycodone switched to vicodin for back pain.  - foley catheter in place for urinary retention  - Fecal retention - pt. Continues to have BM on her own.   - ESR , CRP elevated.  - Very Little return of motor function at this time, poor neurological exam continues, and minimal improvement in sensory function. - Overall this indicates a likely very poor prognosis. 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 Oncology is on board and met w/ pt and family. Goals of care to be discussed with possible outpatient treatment - Per Neurosurgery, Dr. CabChristella Noall address potential need for spinal stabilization surgery prior to discharge.    #Pulmonary Embolus - Found on CT Chest in R middle and R lower lobe pulmonary arteries. Pt. With brief episodes of tachycardia, but otherwise asymptomatic with O2 sats 100% on 2L [place]. Pt. Without chest pain at this time. Blood pressures stable. Lower extremity Venous Duplex negative for DVT.  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 ESR elevated.  - Now on IV heparin (waited 48 hrs post surgery) - Will continue to monitor VS. Stable at this time. - Continue heparin for now  given the potential for surgery. Will need bridge to coumadin prior to discharge.  #Hyperglycemia -  Likely diabetic with PCOS given A1C of 6.7. CBG's 200's - 300.  - Start on Sensitive SSI for glucose control in the hospital.  - Patient informed of  elevated A1C and likely diagnosis of DMII. She agrees with [place] that this is a marginal issue at this point, and we will recommend treatment based upon our goals of care discussion.  - Poor control with CBG's still 200-300. Moderate SSI. Consider backing off on insulin dose as decadron is tapered down.  - Continue to monitor.  - Recommended: adding 4U novolog at meals and 15U Lantus. (per diabetes coordinator)  #Hypervirilization - with clitoromegaly noted on prior exam and buffalo hump and obesity as well as upper lip hair. PCOS vs. Adrenal patholog. Would be unlike to be first presentation of HPA pathology; no evidence of salt wasting on labs. Hypertensive and not hypotensive. Testing during this hospitalization after having recently received steroids would be largely non-diagnostic. Though, could also be a combination of isolated processes i.e. Clitoromegaly, Hirsutism 2/2 obesity. - Recent thought of outpt vs inpt w/up including [place] to assess ovaries vs 21-hydroxylase/ACTH/cortisol.  - TSH, Cortisol WNL - A1C : 6.7 - consistent with PCOS given evidence of cystic structures in pelvis.   #HTN- BP fairly well controlled; previously on BP med that made her feel badly? Unclear what kind  -started with losartan 12.51m at last admission.  -continue losartan.  - Blood pressure well controlled here.     #Obesity-  - consistent with potential PCOS.   FEN/GI: Reg diet; KVO; s/p Enema with some relief Prophylaxis: on full dose IV heparin  Disposition: Pending Goals of Care discussion. Neurosurgery to address need for surgical spinal stabilization.   Subjective:  Pt. Continues to have some back pain. She says that she is doing ok since her conversation with oncology yesterday. She says she is tired and hasn't been sleeping well. Many questions about the Thyroid bx. I discussed this with her at length to the best of my abilities.   Objective: Temp:  [97.7 F (36.5 C)-98.8 F (37.1 C)] 97.7 F (36.5  C) (10/01 0943) Pulse Rate:  [67-95] 76 (10/01 0943) Resp:  [16-18] 18 (10/01 0943) BP: (121-140)/(71-82) 121/71 mmHg (10/01 0943) SpO2:  [97 %-100 %] 99 % (10/01 0943) Physical Exam: General: NAD, AAOx3, morbidly obese  HEENT: Thyromegaly / Goiter noted on exam without tenderness, and no nodules, no LAD, neck supple; significant adipose tissue at base of posterior neck ("hump") Abdomen: Soft and nontender, mildly distended, No organomegaly noted.  Extremities: no edema,  No calf tenderness, 2+ distal pulses bilaterally.    Skin: No lesions or rashes noted, incision over T-spine C/D/I Neuro: cranial nerves grossly normal. Normal speech,0/5  Plantarflexion BL feet. 0/5 Hip Flexor / extensor movement. Full sensation to light touch, possibly L>R. + babinski bilaterally, and hyperreflexivity noted. Neuro exam is at new baseline. No new changes. No sensory deficits, and poor motor exam.   Laboratory:  Recent Labs Lab 12/19/13 0709 12/20/13 0720 12/22/13 0716  WBC 14.3* 18.7* 17.7*  HGB 12.1 12.9 12.5  HCT 35.4* 37.5 36.8  PLT 265 306 265    Recent Labs Lab 12/19/13 0709 12/20/13 0720 12/21/13 0601  NA 135* 133* 131*  K 5.0 4.9 4.6  CL 93* 91* 93*  CO2 30 28 24   BUN 23 26* 24*  CREATININE 0.87 0.77 0.70  CALCIUM 9.1 9.2 9.0  GLUCOSE 226* 245* 263*   Urinalysis    Component Value Date/Time   COLORURINE YELLOW 12/10/2013 1036   APPEARANCEUR CLOUDY* 12/10/2013 1036   LABSPEC 1.022 12/10/2013 1036   PHURINE 5.5 12/10/2013 1036   GLUCOSEU NEGATIVE 12/10/2013 1036   HGBUR NEGATIVE 12/10/2013 1036   BILIRUBINUR NEGATIVE 12/10/2013 1036   KETONESUR NEGATIVE 12/10/2013 1036   PROTEINUR NEGATIVE 12/10/2013 1036   UROBILINOGEN 1.0 12/10/2013 1036   NITRITE NEGATIVE 12/10/2013 1036   LEUKOCYTESUR NEGATIVE 12/10/2013 1036    Phos - 3.9 Mg - 2.2 CRP - 11.6 ESR - 59 CK - 56 B12 - 209 Random Cortisol - 25.4 A1C - 6.7 TSH - 2.13 RPR - nonreactive HIV - nonreactive UPT -  Negative  Imaging/Diagnostic Tests:  CT Chest Abd/ Pelvis 9/23:  CLINICAL DATA: Destructive metastatic lesion of the T3 vertebral  body with no known primary carcinoma.  EXAM:  CT CHEST, ABDOMEN, AND PELVIS WITH CONTRAST  TECHNIQUE:  Multidetector CT imaging of the chest, abdomen and pelvis was  performed following the standard protocol during bolus  administration of intravenous contrast.  CONTRAST: 151m OMNIPAQUE IOHEXOL 300 MG/ML SOLN  COMPARISON: MRI of the cervical spine earlier today.  FINDINGS:  CT CHEST FINDINGS  Within the chest, a right suprahilar mass measures approximately 3.0  x 2.7 x 2.2 cm. This either represents a nodal metastasis or central  lung mass such as small cell carcinoma. A number of smaller  mediastinal lymph nodes are also identified in the right  paratracheal region, prevascular space and AP window.  Destructive lesion of the T3 vertebral body noted which nearly  replaces the entire vertebral body with tumor extension posteriorly  into the epidural space causing significant compression of the  spinal cord. Paravertebral extension of tumor is also suspected  lateral and anterior to the vertebral body, right greater than left.  Massive enlargement of the thyroid gland present with transverse  dimensions of approximately 7.7 x 8.5 cm. The thyroid gland is  incompletely visualized as the neck was not included. Central  dystrophic calcification identified. The enlarged thyroid gland does  cause significant mass effect on the airway with evidence of  tracheal deviation to the right and tracheal narrowing at the  thoracic inlet. The enlarged thyroid gland extends into the superior  mediastinum.  There is evidence of pulmonary embolism with nonocclusive thrombus  identified in the right middle lobe and lower lobe pulmonary  arteries. Pulmonary arterial opacification is not optimal on the  standard venous phase evaluation of the chest but the right sided   pulmonary embolism is clearly present.  There are small bilateral pulmonary nodules. Right-sided pulmonary  nodule measuring 6 mm likely localizes to the very top of the lower  lobe near the major fissure. Left upper lobe nodule measures 7 mm.  Lingular nodule measures 7 mm. Left lower lobe nodule measures 7 mm.  Faint peripheral right middle lobe nodule measures 3 mm. Inferior  right middle lobe nodule measures 6 mm. Several small nodules in the  posterior right upper lobe identified in the 3-4 mm range.  No evidence of pleural or pericardial fluid. No other bony lesions  identified in the thorax.  CT ABDOMEN AND PELVIS FINDINGS  Small low-density lesion in the left lobe of the liver measures 7 mm  and likely represents a cyst. No other hepatic abnormalities. The  gallbladder, pancreas, spleen, adrenal glands and kidneys are  unremarkable. No adenopathy is identified in the abdomen or pelvis.  Bowel shows  normal caliber without evidence of obstruction, lesion  or inflammation. No abnormal fluid collections.  The bladder is moderately distended. Elongated cystic changes in  both adnexal regions identified which may represent a combination of  ovarian cysts and potentially hydrosalpinx. Correlation with pelvic  ultrasound is recommended. No hernias are identified. The uterus  appears unremarkable by CT. No bony lesions are identified in the  abdomen or pelvis.  IMPRESSION:  1. 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  2. 3 cm right suprahilar mass may represent nodal metastasis versus  primary central tumor. Next number multiple small bilateral  pulmonary nodules consistent with metastatic disease.  3. Acute nonocclusive pulmonary embolism in the right middle lobe  and right lower lobe pulmonary arteries.  4. Massive thyroid goiter causing tracheal  deviation to the right  and narrowing of the trachea.  5. Tubular cystic abnormalities of both adnexal regions which may be  a combination of cystic changes in the ovaries and potentially  hydrosalpinx. Correlation recommended with pelvic ultrasound to  exclude any suspicious cystic lesions of the adnexae.  6. Critical Value/emergent results were called by telephone at the  time of interpretation on 12/14/2013 at 4:52 pm to Dr. Tamala Julian of the  [facility] Medicine Teaching Service, who verbally acknowledged these  results.  Electronically Signed  By: Aletta Edouard M.D.  On: 12/14/2013 16:53   MRI T-spine 9/23:  CLINICAL DATA: Lower extremity weakness. Worsening back pain.  Compressive upper thoracic mass partially visualized on cervical  spine MRI as well as chest CT with spinal cord compression.  EXAM:  MRI THORACIC SPINE WITHOUT CONTRAST  TECHNIQUE:  Multiplanar, multisequence MR imaging of the thoracic spine was  performed. No intravenous contrast was administered.  COMPARISON: Cervical spine MRI and chest CT earlier today  FINDINGS:  There is a T3 compression fracture with approximately 50% height  loss centrally. As described on cervical spine MRI and chest CT  earlier today, there is abnormal soft tissue replacing the majority  of the T3 vertebral body extending into the pedicles as well as into  the ventral epidural space with moderate cord compression. Epidural  extension is slightly greater on the right and extends into the  inferior aspect of the right T2-3 neural foramen as well as superior  aspect of the right T3-4 neural foramen. There is also abnormal  paravertebral soft tissue laterally and anteriorly as seen on CT.  Central ventral epidural material posterior to the T4 vertebral body  may represent tumor or hematoma. Mild T2 hyperintensity is present  in the spinal cord at the T3 level extending inferiorly to the T4-5  disc space level consistent with mild edema. Mild  edema is present  in the posterior paraspinal soft tissues of the upper thoracic  spine. No other vertebral body lesions are identified.  There is a moderate-sized central disc extrusion at T7-8 which  contacts and mildly flattens the ventral spinal cord with mild  spinal stenosis. A small left central disc protrusion is present at  T8-9 with at most minimal impression on the spinal cord. Massive  enlargement of the left thyroid lobe is partially visualized.  Mediastinal and lung findings are better demonstrated on recent  chest CT.  IMPRESSION:  1. T3 vertebral body mass with pathologic compression fracture and  epidural tumor resulting in moderate cord compression with evidence  of mild spinal cord edema.  2. T7-8 disc extrusion resulting in mild spinal stenosis.   MRI Head / C-Spine 9/23:  CLINICAL DATA: 48 year old female who presented with back pain and  lower extremity numbness, with continued inadequately explained  lower extremity weakness following Hospital discharge on 12/12/2013.  Initial encounter.  EXAM:  MRI HEAD WITHOUT AND WITH CONTRAST  MRI CERVICAL SPINE WITHOUT AND WITH CONTRAST  TECHNIQUE:  Multiplanar, multiecho pulse sequences of the brain and surrounding  structures, and cervical spine, to include the craniocervical  junction and cervicothoracic junction, were obtained without and  with intravenous contrast.  CONTRAST: 20 mL MultiHance.  COMPARISON: Lumbar MRI 12/10/2013.  FINDINGS:  MRI HEAD FINDINGS  Cerebral volume is normal. No restricted diffusion to suggest acute  infarction. No midline shift, mass effect, evidence of mass lesion,  ventriculomegaly, extra-axial collection or acute intracranial  hemorrhage. Cervicomedullary junction and pituitary are within  normal limits. Major intracranial vascular flow voids are preserved.  Pearline Cables and white matter signal is within normal limits for age  throughout the brain. No abnormal enhancement identified,  pulsation  artifact prominent on axial post-contrast images.  Large body habitus. Small right frontal scalp lipoma (series 10,  image 37). Visible internal auditory structures appear normal. Mild  right mastoid fluid. Minor paranasal sinus mucosal thickening.  Visualized orbit soft tissues are within normal limits. Partially  visible thyromegaly greater on the left, see below. Normal bone  marrow signal.  MRI CERVICAL SPINE FINDINGS  Large body habitus resulting in wrap artifact on axial images.  However, there is massive thyroid enlargement evident, with the left  lobe measuring at least 7.2 x 5.2 x 11.2 cm (AP by transverse by  CC). The trachea is displaced to the right at the thoracic inlet and  mildly compressed. There is other regional mass effect including  sheath. On the left carotid  Fairly preserved cervical lordosis. No marrow edema or evidence of  acute osseous abnormality. Cervicomedullary junction is within  normal limits. Spinal cord signal is within normal limits at all  visualized levels. No abnormal intradural enhancement.  C2-C3: Negative.  C3-C4: Disc osteophyte complex, facet and ligament flavum  hypertrophy. Along with a degree of congenital canal narrowing,  there is mild spinal stenosis and moderate biforaminal stenosis.  C4-C5: Mild uncovertebral and facet hypertrophy. Mild right C5  foraminal stenosis.  C5-C6: Circumferential disc osteophyte complex. Along with a degree  of congenital canal narrowing there is mild spinal stenosis.  Moderate right C6 foraminal stenosis.  C6-C7: Negative except for mild facet hypertrophy.  C7-T1: Negative.  T1-T2: Negative.  Faintly visualized at the T3 level in the upper thoracic spine there  is a lobulated solidly enhancing mass which appears to extend into  the epidural space from the T3 vertebral body. This is inseparable  from the spinal cord which is likely compressed. See series 2000  image 7 and series 1500, image 6.  This is not included on axial  images.  IMPRESSION:  1. Faintly visible but aggressive appearing enhancing mass at the T3  level of the upper thoracic spine, likely with spinal cord  compression evaluate further are with thoracic MRI without and with  contrast.  2. Normal for age MRI appearance of the brain.  3. Massive thyroid enlargement, greater on the left. Left thyroid  goiter/mass measuring at least 7.2 x 5.2 x 11.2 cm. Mass effect at  the thoracic inlet including on the trachea.  4. Mild chronic disc and endplate degeneration in the cervical spine  at  C3-C4 and C5-C6 resulting in borderline to mild spinal stenosis,  but no cervical spinal cord mass effect or signal abnormality.  Electronically Signed:  By: Lars Pinks M.D.  On: 12/14/2013 09:50   MRI L-Spine 9/19:  CLINICAL DATA: Back pain with bilateral leg numbness, worsening  with oral steroids.  EXAM:  MRI LUMBAR SPINE WITHOUT CONTRAST  TECHNIQUE:  Multiplanar, multisequence MR imaging of the lumbar spine was  performed. No intravenous contrast was administered.  COMPARISON: CT 12/09/2013  FINDINGS:  There is no degenerative disease at L2-3 or above. The discs are  normal in signal characteristics and morphology. The spinal canal  and foramina are widely patent. The distal cord and conus are normal  with conus tip at L1. There are small benign and insignificant  hemangiomas within the L2 and L3 vertebral bodies.  L3-4: Minimal desiccation of the disc. Mild circumferential bulging  slightly more prominent towards the left. Bilateral facet  degeneration and hypertrophy. Mild narrowing of the lateral recesses  without apparent neural compression. Mild foraminal narrowing on the  left without apparent neural compression.  L4-5: Desiccation of the disc with circumferential protrusion.  Bilateral facet and ligamentous hypertrophy. Stenosis of both  lateral recesses without definite focal neural compression. Mild  foraminal  narrowing bilaterally.  L5-S1: Desiccation of the disc with loss of height. Endplate  osteophytes and bulging of the disc more prominent towards the left.  Bilateral facet degeneration and hypertrophy left more than right.  Stenosis of the subarticular lateral recess on the left that could  compress the S1 nerve root. Mild foraminal narrowing left more than  right but without apparent compression of the exiting L5 nerve  roots.  Serpiginous fluid-filled structure in the left pelvis could  represent hydrosalpinx. This is not primarily or completely imaged.  IMPRESSION:  Degenerative disc disease and degenerative facet disease in the  lower lumbar spine that could be a cause of lumbago.  Lateral recess and foraminal narrowing at L3-4, L4-5 and L5-S1 as  described above that could be a cause of focal neural compression.  In general, this is more pronounced on the left than on the right.  In particular, there is potential for left S1 nerve root compression  in the subarticular lateral recess at L5-S1.  Electronically Signed  By: Nelson Chimes M.D.  On: 12/10/2013 08:45   CT L-Spine 9/18:  CLINICAL DATA: Low back pain with difficulty walking.  EXAM:  CT LUMBAR SPINE WITHOUT CONTRAST  TECHNIQUE:  Multidetector CT imaging of the lumbar spine was performed without  intravenous contrast administration. Multiplanar CT image  reconstructions were also generated.  COMPARISON: None.  FINDINGS:  There is no fracture or spondylolisthesis. There is moderately  severe disc space narrowing at L5-S1 with slight vacuum phenomenon  at this level. There is moderate narrowing at L4-5. There are  prominent anterior osteophytes at L2, L3, L4, and L5.  At T12-L1, there is moderate facet hypertrophy bilaterally. There is  no nerve root edema or effacement. No disc extrusion or stenosis.  At L1-2, there is moderate facet hypertrophy bilaterally. There is  mild diffuse disc bulging. There is no nerve root  edema or  effacement. No disc extrusion or stenosis.  At L2-3, there is broad-based disc bulging and moderate facet  hypertrophy bilaterally. There is borderline narrowing of the thecal  sac with borderline spinal stenosis. No disc extrusion.  At L3-4, there is severe facet osteoarthritic change bilaterally,  more pronounced on the left than on the right.  There is broad-based  disc bulging and moderate ligamentum flavum hypertrophy. These  findings in concert lead to moderate generalized spinal stenosis.  At L4-5, there is broad-based disc protrusion and severe facet  osteoarthritic change bilaterally. These findings in concert lead to  moderate generalized spinal stenosis. No frank disc extrusion.  At L5-S1, there is severe facet osteoarthritic change on the left  and moderate facet osteoarthritic change on the right. There is  broad-based disc protrusion. There is exit foraminal narrowing  bilaterally, more severe on the left than on the right, due to  osteophytic change. No frank disc extrusion or stenosis is seen at  this level.  There are no paraspinous lesions.  IMPRESSION:  Spinal stenosis at L3-4 and L4-5, multifactorial. Borderline spinal  stenosis at L2-3. There does appear to be a degree of congenital  narrowing of the canal which contributes to spinal stenosis.  Multilevel arthropathy.  No frank disc extrusion.  No fracture or spondylolisthesis.  Electronically Signed  By: Lowella Grip M.D.  On: 12/09/2013 13:50   CXR 9/13:  CLINICAL DATA: Upper back pain for 5 weeks. Dyspnea on exertion.  Nonsmoker. History of hypertension.  EXAM:  CHEST 2 VIEW  COMPARISON: 04/15/2006  FINDINGS:  The heart is mildly enlarged. There is elevation of right  hemidiaphragm, stable in appearance. There is streaky density  favored to be within the right lower lobe, consistent with  atelectasis or infiltrate. No pulmonary edema. No pleural effusions.  There are moderate degenerative  changes in the thoracic spine.  IMPRESSION:  Right lower lobe atelectasis or infiltrate.  Electronically Signed  By: Shon Hale M.D.  On: 12/04/2013 19:26  Venous Duplex 9/24:  Summary:  - No evidence of deep vein thrombosis involving the right lower extremity. - No evidence of deep vein thrombosis involving the left lower extremity.   Surgical Pathology Report: 12/21/2013 Diagnosis Soft tissue mass, simple excision, Thoracic Epidural Tumor - METASTATIC CARCINOMA. PLEASE SEE COMMENT. Microscopic Comment There are clusters of malignant cells with abundant eosinophilic cytoplasm. A battery of immunostains were performed and the tumor cells show the following profile: Cytokeratin 7 - strongly positive Cytokeratin 8/18 - strongly positive TTF-1 - strongly positive. Thyroglobulin - negative CD10 - negative Cytokeratin 20 - negative S100 - negative Vimentin - negative The control stained appropriately. The overall findings are diagnostic for metastatic carcinoma. 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 However, a metastatic oncocytic thyroid carcinoma is still in the differential. Clinical and radiographic correlation is highly recommended. (HCL:kh 12-19-13) Aldona Bar MD Pathologist, Electronic Signature (Case signed 12/20/2013  Elberta Leatherwood, MD 12/22/2013, 11:35 AM PGY-1, [place] [facility] Intern pager: [phone], text pages welcome

## 2013-12-22 NOTE — Progress Notes (Signed)
Patient ID: Sarah [name], female   DOB: 1965-11-14, 48 y.o.   MRN: [id number]   Request received for thyroid biopsy  Pt with known goiter; T3 lesion- tumor resection 9/23 Path favors lung metastasis  Imaging has been reviewed with Dr Barbie Banner Need US thyroid first For evaluation for nodule  Will order this and check [place] later Plan for possible bx 10/2 We will take care of holding Heparin for procedure as needed

## 2013-12-22 NOTE — Progress Notes (Signed)
Seen and examined.  Agree with the documentation and management of Dr. Alease Frame.  For thyroid biopsy.

## 2013-12-22 NOTE — Progress Notes (Signed)
Inpatient Diabetes Program Recommendations  AACE/ADA: New Consensus Statement on Inpatient Glycemic Control (2013)  Target Ranges:  Prepandial:   less than 140 mg/dL      Peak postprandial:   less than 180 mg/dL (1-2 hours)      Critically ill patients:  140 - 180 mg/dL  Results for CHALONDA, SCHLATTER (MRN [id number]) as of 12/22/2013 10:00  Ref. Range 12/21/2013 06:45 12/21/2013 11:19 12/21/2013 16:38 12/21/2013 21:10 12/22/2013 06:35  Glucose-Capillary Latest Range: 70-99 mg/dL 256 (H) 252 (H) 367 (H) 296 (H) 231 (H)   Reason for Visit: elevated blood sugars  Diabetes history: none Outpatient Diabetes medications: none Current orders for Inpatient glycemic control: Novolog correction 0-20 units with meals and novolog 0-5units at hs.   Given that her CBG remain elevated may want to consider adding mealtime Novolog 4 units and Lantus 15 units (147kg x 0.1).     Gentry Fitz, RN, BA, MHA, CDE Diabetes Coordinator Inpatient Diabetes Program  [phone] (Team Pager) [phone] [facility]) 12/22/2013 10:01 AM

## 2013-12-22 NOTE — Progress Notes (Signed)
Nutrition Brief Note  Patient identified on the Malnutrition Screening Tool (MST) Report/LOST. MST score was filed inaccurately.   Wt Readings from Last 15 Encounters:  12/15/13 313 lb 15 oz (142.4 kg)  12/15/13 313 lb 15 oz (142.4 kg)  12/09/13 315 lb (142.883 kg)  12/04/13 320 lb (145.151 kg)  01/18/13 320 lb (145.151 kg)  12/09/10 275 lb (124.739 kg)  10/02/10 275 lb (124.739 kg)    Body mass index is 43.8 kg/(m^2). Patient meets criteria for Morbid Obesity based on current BMI.   Current diet order is Regular, patient is consuming approximately 75-100% of meals at this time. Pt reports having a good appetite and eating normally. Labs and medications reviewed. Pt denies any nutrition related questions or concerns at this time.   No nutrition interventions warranted at this time. If nutrition issues arise, please consult RD.   Pryor Ochoa RD, LDN Inpatient Clinical Dietitian Pager: [phone] After Hours Pager: [phone]

## 2013-12-22 NOTE — Progress Notes (Addendum)
Pt c/o pain at catheter insertion site.  Catheter and patient repositioned.  Pt further c/o catheter not feeling right, pain and pressure in the stomach/bladder area, drainage bag with very little output. Pt bladder scanned, 664.  RN removed catheter and then attempted foley re-insertion.  Pt in/out cath, 1000 output.  Will re-attempt foley insertion later.  Will continue to monitor.  Attempted foley re-insertion- no urine return.  Pt stated that she did not drink anything, no c/o pain or pressure.  Will continue to monitor.  Cori Razor, RN

## 2013-12-22 NOTE — Progress Notes (Signed)
ANTICOAGULATION CONSULT NOTE - Follow Up Consult  Pharmacy Consult:  Heparin Indication: pulmonary embolus  Allergies  Allergen Reactions  . Tramadol Nausea And Vomiting    Patient Measurements: Height: 5\' 11"  (180.3 cm) Weight: 313 lb 15 oz (142.4 kg) IBW/kg (Calculated) : 70.8 Heparin Dosing Weight: 104 kg  Vital Signs: Temp: 97.7 F (36.5 C) (10/01 0943) Temp src: Oral (10/01 0943) BP: 121/71 mmHg (10/01 0943) Pulse Rate: 76 (10/01 0943)  Labs:  Recent Labs  12/20/13 0720 12/21/13 0601 12/22/13 0716  HGB 12.9  --  12.5  HCT 37.5  --  36.8  PLT 306  --  265  APTT  --   --  48*  LABPROT  --   --  13.5  INR  --   --  1.03  HEPARINUNFRC 0.56 0.52 0.30  CREATININE 0.77 0.70  --     Estimated Creatinine Clearance: 136.4 ml/min (by C-G formula based on Cr of 0.7).   Assessment: 49 YOF admitted 12/12/13 with lower extremity weakness and found to have mass at T-3 level with spinal compression. She had surgery on 12/15/13 for decompression and biopsy.  Pt also found to have a PE on CT chest and started on IV heparin 48 hours post-operatively.  Lower extremity Venous Duplex 12/15/13 negative for DVT.  Hypercoagulable state likely from cancer - awaiting biopsy results for primary source.  Heparin level today = 0.3, at low-end of therapeutic goal on 1500 units/hr IV. CBC trending down however, no bleeding reported.  Goal of Therapy:  Heparin level 0.3-0.7 units/ml Monitor platelets by anticoagulation protocol: Yes    Plan:  1. Increase heparin gtt to 1600 units/hr 2. Daily heparin level and CBC 3. Follow up plans for thyroid biopsy (possibly 10/2).  Heparin will need to be held around time of procedure.   4. Follow up plans for long term anticoagulation.  Long-term Lovenox is preferred based on the oncology guidelines.  May want to discuss this possibility with Dr. Jana Hakim and the patient.  May need to check insurance coverage/copay for long-term Lovenox.  Dose would be  150mg  SQ q12h.     [employer], Pharm.D., BCPS Clinical Pharmacist Pager [phone] 12/22/2013 11:24 AM

## 2013-12-22 NOTE — Telephone Encounter (Signed)
Called left vm message to call with my name and phone number

## 2013-12-22 NOTE — Progress Notes (Signed)
REFERRAL FOR INSURANCE NEEDS  [name] admitted as self pay- the Financial Counselor consults all self pay patients to discuss financial options/ needs; Talked to [employer], stated that the patient applied for [facility] 12/15/2013 in [employer] and it is pending for approval; records will be sent to [place], [place] after discharge for possible disability; Aneta Mins [phone]

## 2013-12-23 ENCOUNTER — Inpatient Hospital Stay (HOSPITAL_COMMUNITY): Payer: Medicaid Other

## 2013-12-23 LAB — BASIC METABOLIC PANEL
Anion gap: 13 (ref 5–15)
BUN: 31 mg/dL — ABNORMAL HIGH (ref 6–23)
CO2: 26 mEq/L (ref 19–32)
CREATININE: 0.71 mg/dL (ref 0.50–1.10)
Calcium: 9 mg/dL (ref 8.4–10.5)
Chloride: 93 mEq/L — ABNORMAL LOW (ref 96–112)
Glucose, Bld: 214 mg/dL — ABNORMAL HIGH (ref 70–99)
Potassium: 5.1 mEq/L (ref 3.7–5.3)
Sodium: 132 mEq/L — ABNORMAL LOW (ref 137–147)

## 2013-12-23 LAB — URINE MICROSCOPIC-ADD ON

## 2013-12-23 LAB — CBC
HCT: 38.9 % (ref 36.0–46.0)
Hemoglobin: 13.2 g/dL (ref 12.0–15.0)
MCH: 29.2 pg (ref 26.0–34.0)
MCHC: 33.9 g/dL (ref 30.0–36.0)
MCV: 86.1 fL (ref 78.0–100.0)
PLATELETS: 264 10*3/uL (ref 150–400)
RBC: 4.52 MIL/uL (ref 3.87–5.11)
RDW: 14.5 % (ref 11.5–15.5)
WBC: 18.1 10*3/uL — ABNORMAL HIGH (ref 4.0–10.5)

## 2013-12-23 LAB — GLUCOSE, CAPILLARY
Glucose-Capillary: 302 mg/dL — ABNORMAL HIGH (ref 70–99)
Glucose-Capillary: 186 mg/dL — ABNORMAL HIGH (ref 70–99)
Glucose-Capillary: 323 mg/dL — ABNORMAL HIGH (ref 70–99)

## 2013-12-23 LAB — URINALYSIS, ROUTINE W REFLEX MICROSCOPIC
BILIRUBIN URINE: NEGATIVE
Glucose, UA: NEGATIVE mg/dL
KETONES UR: NEGATIVE mg/dL
Nitrite: NEGATIVE
PH: 7.5 (ref 5.0–8.0)
Protein, ur: 100 mg/dL — AB
SPECIFIC GRAVITY, URINE: 1.018 (ref 1.005–1.030)
Urobilinogen, UA: 0.2 mg/dL (ref 0.0–1.0)

## 2013-12-23 LAB — HEPARIN LEVEL (UNFRACTIONATED): HEPARIN UNFRACTIONATED: 0.42 [IU]/mL (ref 0.30–0.70)

## 2013-12-23 MED ORDER — HEPARIN (PORCINE) IN NACL 100-0.45 UNIT/ML-% IJ SOLN
1600.0000 [IU]/h | INTRAMUSCULAR | Status: DC
  Administered 2013-12-24 – 2013-12-30 (×9): 1600 [IU]/h via INTRAVENOUS
  Filled 2013-12-23 (×9): qty 250

## 2013-12-23 MED ORDER — RISAQUAD PO CAPS
2.0000 | ORAL_CAPSULE | Freq: Three times a day (TID) | ORAL | Status: DC
  Administered 2013-12-23 – 2014-01-09 (×46): 2 via ORAL
  Filled 2013-12-23 (×50): qty 2

## 2013-12-23 MED ORDER — CIPROFLOXACIN HCL 250 MG PO TABS
250.0000 mg | ORAL_TABLET | Freq: Two times a day (BID) | ORAL | Status: AC
  Administered 2013-12-23 – 2013-12-26 (×6): 250 mg via ORAL
  Filled 2013-12-23 (×6): qty 1

## 2013-12-23 MED ORDER — INSULIN ASPART 100 UNIT/ML ~~LOC~~ SOLN
0.0000 [IU] | Freq: Three times a day (TID) | SUBCUTANEOUS | Status: DC
  Administered 2013-12-23: 3 [IU] via SUBCUTANEOUS
  Administered 2013-12-24 (×2): 8 [IU] via SUBCUTANEOUS
  Administered 2013-12-24: 11 [IU] via SUBCUTANEOUS
  Administered 2013-12-25 – 2013-12-26 (×4): 8 [IU] via SUBCUTANEOUS

## 2013-12-23 MED ORDER — BACID PO TABS
2.0000 | ORAL_TABLET | Freq: Three times a day (TID) | ORAL | Status: DC
  Filled 2013-12-23 (×2): qty 2

## 2013-12-23 MED ORDER — INSULIN GLARGINE 100 UNIT/ML ~~LOC~~ SOLN
10.0000 [IU] | Freq: Every day | SUBCUTANEOUS | Status: DC
  Administered 2013-12-23 – 2013-12-26 (×4): 10 [IU] via SUBCUTANEOUS
  Filled 2013-12-23 (×5): qty 0.1

## 2013-12-23 MED ORDER — LIDOCAINE HCL (PF) 1 % IJ SOLN
INTRAMUSCULAR | Status: AC
  Filled 2013-12-23: qty 10

## 2013-12-23 NOTE — Progress Notes (Signed)
Dr. Annamaria Boots in radiology requests Heparin drip held at this time prior to patient going for thyroid biopsy.

## 2013-12-23 NOTE — Procedures (Signed)
Successful RT THYROID FNA X 4 NO COMP STABLE PATH PENDING FULL REPORT IN PACS

## 2013-12-23 NOTE — Progress Notes (Signed)
ANTICOAGULATION CONSULT NOTE - Follow Up Consult  Pharmacy Consult:  Heparin Indication: pulmonary embolus  Allergies  Allergen Reactions  . Tramadol Nausea And Vomiting    Patient Measurements: Height: 5\' 11"  (180.3 cm) Weight: 313 lb 15 oz (142.4 kg) IBW/kg (Calculated) : 70.8 Heparin Dosing Weight: 104 kg  Vital Signs: Temp: 97.9 F (36.6 C) (10/02 1425) Temp Source: Oral (10/02 1425) BP: 126/85 mmHg (10/02 1425) Pulse Rate: 89 (10/02 1425)  Labs:  Recent Labs  12/21/13 0601 12/22/13 0716 12/23/13 0456 12/23/13 1344  HGB  --  12.5 13.2  --   HCT  --  36.8 38.9  --   PLT  --  265 264  --   APTT  --  48*  --   --   LABPROT  --  13.5  --   --   INR  --  1.03  --   --   HEPARINUNFRC 0.52 0.30 0.42  --   CREATININE 0.70  --   --  0.71    Estimated Creatinine Clearance: 136.4 ml/min (by C-G formula based on Cr of 0.71).   Assessment: 48 YO F admitted 12/12/13 with lower extremity weakness and found to have mass at T-3 level with spinal compression. She had surgery on 12/15/13 for decompression and biopsy.  Pt also found to have a PE on CT chest and started on IV heparin 48 hours post-operatively.  Lower extremity Venous Duplex 12/15/13 negative for DVT.  Hypercoagulable state likely from cancer - awaiting biopsy results for primary source.  Heparin level today = 0.42 on 1600 units/hr.  Heparin was held today at [facility] for thyroid biopsy.  Will restart now at previous rate.   CBC trending down however, no bleeding reported.  Goal of Therapy:  Heparin level 0.3-0.7 units/ml Monitor platelets by anticoagulation protocol: Yes    Plan:  1. Continue heparin at 1600 units/hr 2. Daily heparin level and CBC 3. Follow up plans for long term anticoagulation.  Long-term Lovenox is preferred based on the oncology guidelines.  May want to discuss this possibility with Dr. Jana Hakim and the patient.  May need to check insurance coverage/copay for long-term Lovenox.  Dose would be  150mg  SQ q12h.     [employer], Pharm.D., BCPS Clinical Pharmacist Pager [phone] 12/23/2013 3:24 PM

## 2013-12-23 NOTE — Progress Notes (Signed)
PT Cancellation Note  ___Treatment cancelled today due to medical issues with patient which prohibited therapy  ___ Treatment cancelled today due to patient receiving procedure or test   ___ Treatment cancelled today due to patient's refusal to participate   _X_ Treatment cancelled today due to Thyroid Bx  Sarah [name]  PTA [facility]  Acute  Rehab Pager      [phone]

## 2013-12-23 NOTE — Progress Notes (Signed)
Seen and examined.  Agree with Dr. Eyvonne Mechanic documentation and management.  She is feeling much better with surgical drainage and addition of Zosyn.

## 2013-12-23 NOTE — Progress Notes (Signed)
Inpatient Diabetes Program Recommendations  AACE/ADA: New Consensus Statement on Inpatient Glycemic Control (2013)  Target Ranges:  Prepandial:   less than 140 mg/dL      Peak postprandial:   less than 180 mg/dL (1-2 hours)      Critically ill patients:  140 - 180 mg/dL   Glucose now in 300's fairly steadily. Either increase correction to resistant plus HS correction scale or add meal coverage per below. Inpatient Diabetes Program Recommendations Insulin - Meal Coverage: Please add 3-4 units meal coverage while on decadron 4 mg tid  Thank you, Rosita Kea, RN, CNS, Diabetes Coordinator [phone])

## 2013-12-23 NOTE — Progress Notes (Signed)
Patient experiencing major discomfort in bladder due to inability to void. Bladder scanner showed 258mL and patient requested a re-attempt at inserting the foley catheter. This RN and two nurse techs inserted foley catheter.200 mL of cloudy urine returned. Patient tolerated procedure well and experienced immediate relief. Will continue to monitor patient. Ileene Rubens Onya Eutsler,RN

## 2013-12-23 NOTE — Progress Notes (Signed)
Heparin drip restarted per Dr. Alease Frame and pharmacy.

## 2013-12-23 NOTE — Clinical Social Work Note (Signed)
CSW met the pt and family at bedside. Pt's sister [name] reported the family had a meeting and the pt wishes to return home. CSW acknowledged the pt statement. CSW and family dicussed that a SNF bed at the [facility] is still avaible. Pt's family reported understanding that a bed has been offered. CSW will continue to follow and provided support to the pt and family.   [place], MSW, [place]

## 2013-12-23 NOTE — Progress Notes (Signed)
Tried paging MD but line showing busy. Texted on amion about urinalysis results. Awaiting response.

## 2013-12-23 NOTE — Progress Notes (Signed)
Family Medicine Teaching Service Daily Progress Note Intern Pager: [phone]  Patient name: Sarah [name] Medical record number: [id number] Date of birth: [birth year] Age: 48 y.o. Gender: female  Primary Care Provider: No PCP Per Patient Consultants: Neurosurgery, IR, Oncology Code Status: Full  Pt Overview and Major Events to Date:  - 9/22: Pt. Admitted with lower extremity weakness.  - 9/23: Pt. Found to have mass at T-3 level with spinal compression.  - 9/24: Surgery performed for decompression and biopsy overnight. Pt. Post-op and doing well. 3cm Suprahilar mass on CT Chest, PE also identified. ?Thyroid etiology, but much less likely for mets. Oncology consulted.  - 9/25 - 9/29: Awaiting pathology results with plan for oncology to see once path returns. Pt. With little neurological improvement. Started with light PT.  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 Oncology on board. Neurosurgery to comment on need for spinal stabilization surgery.  - 10/1: Radiology to perform thyroid US; Heterogeneous throughout, cannot r/o malignancy - 10/2: Thyroid Bx today; awaiting results.  Assessment and Plan: Sarah [name] is a 48 y.o. female presenting with continued lower extremity weakness at baseline after discharge from hospital on 9/21. PMH is significant for obesity, HTN, and ?History of Hypothyroidism previously on Synthroid per patient.   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 Considering Mets vs. Primary tumor. Pulmonary lesion noted on CT as well as Large thyroid mass ?Goiter. Family history and social history on further evaluation reveal little in the way of risk factors. Subsequent CT chest, abdomen, pelvis with 3cm R Suprahilar pulmonary nodule and multiple smaller  nodules. Neurosurgery took her to the OR on 9/23, and biopsied as well as performed laminectomy for decompression. - PT recommendations SNF with 24hour supervision. Will plan dispo based on goals of care discussion.  - Discussed Recs for PT with Neurosurgery. Light PT at this point. Pt. Not to get UOB.  - Decadron taper initiated 76mQID > 476mTID > 30m60mID  - Large thyroid goiter noted. TSH normal; ultrasound (10/1) showing diffuse heterogenicity, cannot r/o malignancy, Biopsy on 10/2; results pending 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 Oncology on board.  - flexeril, oxycodone switched to vicodin for back pain.  - foley catheter in place for urinary retention  - Fecal retention - pt. Continues to have BM on her own.   - ESR , CRP elevated.  - Very Little return of motor function at this time, poor neurological exam continues, and minimal improvement in sensory function. - Overall this indicates a likely very poor prognosis. 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 Oncology is on board and met w/ pt and family. Goals of care to be discussed with possible outpatient treatment - Per Neurosurgery, Dr. CabChristella Noaates will take to OR as early as next week.   #Pulmonary Embolus - Found on CT Chest in R middle and R lower lobe pulmonary arteries. Pt. With brief episodes of tachycardia, but otherwise asymptomatic with O2 sats 100% on 2L [place]. Pt. Without chest pain at this time. Blood pressures stable. Lower extremity Venous Duplex negative for DVT.  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 ESR elevated.  - Now on IV heparin (waited 48 hrs post surgery) - Will continue to monitor VS. Stable at  this time. - Continue heparin for now given the potential for surgery. Will need bridge to coumadin prior to discharge.  #Hyperglycemia -  Likely diabetic with PCOS given A1C of 6.7. CBG's 200's - 300.  - Start on  Sensitive SSI for glucose control in the hospital.  - Patient informed of elevated A1C and likely diagnosis of DMII. She agrees with [place] that this is a marginal issue at this point, and we will recommend treatment based upon our goals of care discussion.  - Poor control with CBG's still 200-300. Moderate SSI. Consider backing off on insulin dose as decadron is tapered down.  - Continue to monitor.  - Moderate SSI; 10U Lantus.  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 PCOS vs. Adrenal patholog. Would be unlike to be first presentation of HPA pathology; no evidence of salt wasting on labs. Hypertensive and not hypotensive. Testing during this hospitalization after having recently received steroids would be largely non-diagnostic. Though, could also be a combination of isolated processes i.e. Clitoromegaly, Hirsutism 2/2 obesity. - Recent thought of outpt vs inpt w/up including [place] to assess ovaries vs 21-hydroxylase/ACTH/cortisol.  - TSH, Cortisol WNL - A1C : 6.7 - consistent with PCOS given evidence of cystic structures in pelvis.   #HTN- BP fairly well controlled; previously on BP med that made her feel badly? Unclear what kind  -started with losartan 12.78m at last admission.  -continue losartan.  - Blood pressure well controlled here.     #Obesity-  - consistent with potential PCOS.   #Suspect UTI, 2/2 foley catheter. - dysuria and suprapubic pain noted PM 10/1. - UA 10/2 suspicious of UTI/cystitis.  - Urine culture pending - Will add Cipro 2562mBID (x3days)  FEN/GI: Reg diet; KVO; s/p Enema with some relief Prophylaxis: on full dose IV heparin  Disposition: Pending Goals of Care discussion. Neurosurgery to address need for surgical spinal stabilization.   Subjective:  Pt. Continues to have some back pain. She says she is tired and hasn't been sleeping well. Is "just waiting for this biopsy". Some complaints of  dysuria and suprapubic discomfort. Foley replaced overnight. UA and culture ordered.  Objective: Temp:  [97.7 F (36.5 C)-98.6 F (37 C)] 97.7 F (36.5 C) (10/02 1023) Pulse Rate:  [73-98] 81 (10/02 1023) Resp:  [18-20] 18 (10/02 1023) BP: (131-149)/(78-93) 131/80 mmHg (10/02 1023) SpO2:  [96 %-100 %] 98 % (10/02 1023) Physical Exam: General: NAD, AAOx3, morbidly obese  HEENT: Thyromegaly / Goiter noted on exam without tenderness, and no nodules, no LAD, neck supple; significant adipose tissue at base of posterior neck ("hump") Abdomen: Soft and nontender, mildly distended, No organomegaly noted.  Extremities: no edema,  No calf tenderness, 2+ distal pulses bilaterally.    Skin: No lesions or rashes noted, incision over T-spine C/D/I Neuro: cranial nerves grossly normal. Normal speech,0/5  Plantarflexion BL feet. 0/5 Hip Flexor / extensor movement. Full sensation to light touch, possibly L>R. + babinski bilaterally, and hyperreflexivity noted. Neuro exam is at new baseline. No new changes. No sensory deficits, and poor motor exam.   Laboratory:  Recent Labs Lab 12/20/13 0720 12/22/13 0716 12/23/13 0456  WBC 18.7* 17.7* 18.1*  HGB 12.9 12.5 13.2  HCT 37.5 36.8 38.9  PLT 306 265 264    Recent Labs Lab 12/19/13 0709 12/20/13 0720 12/21/13 0601  NA 135* 133* 131*  K 5.0 4.9 4.6  CL 93* 91* 93*  CO2 30  28 24  BUN 23 26* 24*  CREATININE 0.87 0.77 0.70  CALCIUM 9.1 9.2 9.0  GLUCOSE 226* 245* 263*   Urinalysis    Component Value Date/Time   COLORURINE AMBER* 12/23/2013 1058   APPEARANCEUR TURBID* 12/23/2013 1058   LABSPEC 1.018 12/23/2013 1058   PHURINE 7.5 12/23/2013 1058   GLUCOSEU NEGATIVE 12/23/2013 1058   HGBUR LARGE* 12/23/2013 1058   BILIRUBINUR NEGATIVE 12/23/2013 1058   KETONESUR NEGATIVE 12/23/2013 1058   PROTEINUR 100* 12/23/2013 1058   UROBILINOGEN 0.2 12/23/2013 1058   NITRITE NEGATIVE 12/23/2013 1058   LEUKOCYTESUR LARGE* 12/23/2013 1058    Phos - 3.9 Mg -  2.2 CRP - 11.6 ESR - 59 CK - 56 B12 - 209 Random Cortisol - 25.4 A1C - 6.7 TSH - 2.13 RPR - nonreactive HIV - nonreactive UPT - Negative  Imaging/Diagnostic Tests:  CT Chest Abd/ Pelvis 9/23:  CLINICAL DATA: Destructive metastatic lesion of the T3 vertebral  body with no known primary carcinoma.  EXAM:  CT CHEST, ABDOMEN, AND PELVIS WITH CONTRAST  TECHNIQUE:  Multidetector CT imaging of the chest, abdomen and pelvis was  performed following the standard protocol during bolus  administration of intravenous contrast.  CONTRAST: 136m OMNIPAQUE IOHEXOL 300 MG/ML SOLN  COMPARISON: MRI of the cervical spine earlier today.  FINDINGS:  CT CHEST FINDINGS  Within the chest, a right suprahilar mass measures approximately 3.0  x 2.7 x 2.2 cm. This either represents a nodal metastasis or central  lung mass such as small cell carcinoma. A number of smaller  mediastinal lymph nodes are also identified in the right  paratracheal region, prevascular space and AP window.  Destructive lesion of the T3 vertebral body noted which nearly  replaces the entire vertebral body with tumor extension posteriorly  into the epidural space causing significant compression of the  spinal cord. Paravertebral extension of tumor is also suspected  lateral and anterior to the vertebral body, right greater than left.  Massive enlargement of the thyroid gland present with transverse  dimensions of approximately 7.7 x 8.5 cm. The thyroid gland is  incompletely visualized as the neck was not included. Central  dystrophic calcification identified. The enlarged thyroid gland does  cause significant mass effect on the airway with evidence of  tracheal deviation to the right and tracheal narrowing at the  thoracic inlet. The enlarged thyroid gland extends into the superior  mediastinum.  There is evidence of pulmonary embolism with nonocclusive thrombus  identified in the right middle lobe and lower lobe pulmonary   arteries. Pulmonary arterial opacification is not optimal on the  standard venous phase evaluation of the chest but the right sided  pulmonary embolism is clearly present.  There are small bilateral pulmonary nodules. Right-sided pulmonary  nodule measuring 6 mm likely localizes to the very top of the lower  lobe near the major fissure. Left upper lobe nodule measures 7 mm.  Lingular nodule measures 7 mm. Left lower lobe nodule measures 7 mm.  Faint peripheral right middle lobe nodule measures 3 mm. Inferior  right middle lobe nodule measures 6 mm. Several small nodules in the  posterior right upper lobe identified in the 3-4 mm range.  No evidence of pleural or pericardial fluid. No other bony lesions  identified in the thorax.  CT ABDOMEN AND PELVIS FINDINGS  Small low-density lesion in the left lobe of the liver measures 7 mm  and likely represents a cyst. No other hepatic abnormalities. The  gallbladder, pancreas, spleen, adrenal  glands and kidneys are  unremarkable. No adenopathy is identified in the abdomen or pelvis.  Bowel shows normal caliber without evidence of obstruction, lesion  or inflammation. No abnormal fluid collections.  The bladder is moderately distended. Elongated cystic changes in  both adnexal regions identified which may represent a combination of  ovarian cysts and potentially hydrosalpinx. Correlation with pelvic  ultrasound is recommended. No hernias are identified. The uterus  appears unremarkable by CT. No bony lesions are identified in the  abdomen or pelvis.  IMPRESSION:  1. 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  2. 3 cm right suprahilar mass may represent nodal metastasis versus  primary central tumor. Next number multiple small bilateral  pulmonary nodules consistent with metastatic disease.  3. Acute nonocclusive  pulmonary embolism in the right middle lobe  and right lower lobe pulmonary arteries.  4. Massive thyroid goiter causing tracheal deviation to the right  and narrowing of the trachea.  5. Tubular cystic abnormalities of both adnexal regions which may be  a combination of cystic changes in the ovaries and potentially  hydrosalpinx. Correlation recommended with pelvic ultrasound to  exclude any suspicious cystic lesions of the adnexae.  6. Critical Value/emergent results were called by telephone at the  time of interpretation on 12/14/2013 at 4:52 pm to Dr. Tamala Julian of the  [facility] Medicine Teaching Service, who verbally acknowledged these  results.  Electronically Signed  By: Aletta Edouard M.D.  On: 12/14/2013 16:53   MRI T-spine 9/23:  CLINICAL DATA: Lower extremity weakness. Worsening back pain.  Compressive upper thoracic mass partially visualized on cervical  spine MRI as well as chest CT with spinal cord compression.  EXAM:  MRI THORACIC SPINE WITHOUT CONTRAST  TECHNIQUE:  Multiplanar, multisequence MR imaging of the thoracic spine was  performed. No intravenous contrast was administered.  COMPARISON: Cervical spine MRI and chest CT earlier today  FINDINGS:  There is a T3 compression fracture with approximately 50% height  loss centrally. As described on cervical spine MRI and chest CT  earlier today, there is abnormal soft tissue replacing the majority  of the T3 vertebral body extending into the pedicles as well as into  the ventral epidural space with moderate cord compression. Epidural  extension is slightly greater on the right and extends into the  inferior aspect of the right T2-3 neural foramen as well as superior  aspect of the right T3-4 neural foramen. There is also abnormal  paravertebral soft tissue laterally and anteriorly as seen on CT.  Central ventral epidural material posterior to the T4 vertebral body  may represent tumor or hematoma. Mild T2 hyperintensity is  present  in the spinal cord at the T3 level extending inferiorly to the T4-5  disc space level consistent with mild edema. Mild edema is present  in the posterior paraspinal soft tissues of the upper thoracic  spine. No other vertebral body lesions are identified.  There is a moderate-sized central disc extrusion at T7-8 which  contacts and mildly flattens the ventral spinal cord with mild  spinal stenosis. A small left central disc protrusion is present at  T8-9 with at most minimal impression on the spinal cord. Massive  enlargement of the left thyroid lobe is partially visualized.  Mediastinal and lung findings are better demonstrated on recent  chest CT.  IMPRESSION:  1. T3 vertebral body mass with pathologic compression fracture  and  epidural tumor resulting in moderate cord compression with evidence  of mild spinal cord edema.  2. T7-8 disc extrusion resulting in mild spinal stenosis.   MRI Head / C-Spine 9/23:  CLINICAL DATA: 48 year old female who presented with back pain and  lower extremity numbness, with continued inadequately explained  lower extremity weakness following Hospital discharge on 12/12/2013.  Initial encounter.  EXAM:  MRI HEAD WITHOUT AND WITH CONTRAST  MRI CERVICAL SPINE WITHOUT AND WITH CONTRAST  TECHNIQUE:  Multiplanar, multiecho pulse sequences of the brain and surrounding  structures, and cervical spine, to include the craniocervical  junction and cervicothoracic junction, were obtained without and  with intravenous contrast.  CONTRAST: 20 mL MultiHance.  COMPARISON: Lumbar MRI 12/10/2013.  FINDINGS:  MRI HEAD FINDINGS  Cerebral volume is normal. No restricted diffusion to suggest acute  infarction. No midline shift, mass effect, evidence of mass lesion,  ventriculomegaly, extra-axial collection or acute intracranial  hemorrhage. Cervicomedullary junction and pituitary are within  normal limits. Major intracranial vascular flow voids are  preserved.  Pearline Cables and white matter signal is within normal limits for age  throughout the brain. No abnormal enhancement identified, pulsation  artifact prominent on axial post-contrast images.  Large body habitus. Small right frontal scalp lipoma (series 10,  image 37). Visible internal auditory structures appear normal. Mild  right mastoid fluid. Minor paranasal sinus mucosal thickening.  Visualized orbit soft tissues are within normal limits. Partially  visible thyromegaly greater on the left, see below. Normal bone  marrow signal.  MRI CERVICAL SPINE FINDINGS  Large body habitus resulting in wrap artifact on axial images.  However, there is massive thyroid enlargement evident, with the left  lobe measuring at least 7.2 x 5.2 x 11.2 cm (AP by transverse by  CC). The trachea is displaced to the right at the thoracic inlet and  mildly compressed. There is other regional mass effect including  sheath. On the left carotid  Fairly preserved cervical lordosis. No marrow edema or evidence of  acute osseous abnormality. Cervicomedullary junction is within  normal limits. Spinal cord signal is within normal limits at all  visualized levels. No abnormal intradural enhancement.  C2-C3: Negative.  C3-C4: Disc osteophyte complex, facet and ligament flavum  hypertrophy. Along with a degree of congenital canal narrowing,  there is mild spinal stenosis and moderate biforaminal stenosis.  C4-C5: Mild uncovertebral and facet hypertrophy. Mild right C5  foraminal stenosis.  C5-C6: Circumferential disc osteophyte complex. Along with a degree  of congenital canal narrowing there is mild spinal stenosis.  Moderate right C6 foraminal stenosis.  C6-C7: Negative except for mild facet hypertrophy.  C7-T1: Negative.  T1-T2: Negative.  Faintly visualized at the T3 level in the upper thoracic spine there  is a lobulated solidly enhancing mass which appears to extend into  the epidural space from the T3  vertebral body. This is inseparable  from the spinal cord which is likely compressed. See series 2000  image 7 and series 1500, image 6. This is not included on axial  images.  IMPRESSION:  1. Faintly visible but aggressive appearing enhancing mass at the T3  level of the upper thoracic spine, likely with spinal cord  compression evaluate further are with thoracic MRI without and with  contrast.  2. Normal for age MRI appearance of the brain.  3. Massive thyroid enlargement, greater on the left. Left thyroid  goiter/mass measuring at least 7.2 x 5.2 x 11.2 cm. Mass effect at  the thoracic inlet  including on the trachea.  4. Mild chronic disc and endplate degeneration in the cervical spine  at C3-C4 and C5-C6 resulting in borderline to mild spinal stenosis,  but no cervical spinal cord mass effect or signal abnormality.  Electronically Signed:  By: Lars Pinks M.D.  On: 12/14/2013 09:50   MRI L-Spine 9/19:  CLINICAL DATA: Back pain with bilateral leg numbness, worsening  with oral steroids.  EXAM:  MRI LUMBAR SPINE WITHOUT CONTRAST  TECHNIQUE:  Multiplanar, multisequence MR imaging of the lumbar spine was  performed. No intravenous contrast was administered.  COMPARISON: CT 12/09/2013  FINDINGS:  There is no degenerative disease at L2-3 or above. The discs are  normal in signal characteristics and morphology. The spinal canal  and foramina are widely patent. The distal cord and conus are normal  with conus tip at L1. There are small benign and insignificant  hemangiomas within the L2 and L3 vertebral bodies.  L3-4: Minimal desiccation of the disc. Mild circumferential bulging  slightly more prominent towards the left. Bilateral facet  degeneration and hypertrophy. Mild narrowing of the lateral recesses  without apparent neural compression. Mild foraminal narrowing on the  left without apparent neural compression.  L4-5: Desiccation of the disc with circumferential protrusion.   Bilateral facet and ligamentous hypertrophy. Stenosis of both  lateral recesses without definite focal neural compression. Mild  foraminal narrowing bilaterally.  L5-S1: Desiccation of the disc with loss of height. Endplate  osteophytes and bulging of the disc more prominent towards the left.  Bilateral facet degeneration and hypertrophy left more than right.  Stenosis of the subarticular lateral recess on the left that could  compress the S1 nerve root. Mild foraminal narrowing left more than  right but without apparent compression of the exiting L5 nerve  roots.  Serpiginous fluid-filled structure in the left pelvis could  represent hydrosalpinx. This is not primarily or completely imaged.  IMPRESSION:  Degenerative disc disease and degenerative facet disease in the  lower lumbar spine that could be a cause of lumbago.  Lateral recess and foraminal narrowing at L3-4, L4-5 and L5-S1 as  described above that could be a cause of focal neural compression.  In general, this is more pronounced on the left than on the right.  In particular, there is potential for left S1 nerve root compression  in the subarticular lateral recess at L5-S1.  Electronically Signed  By: Nelson Chimes M.D.  On: 12/10/2013 08:45   CT L-Spine 9/18:  CLINICAL DATA: Low back pain with difficulty walking.  EXAM:  CT LUMBAR SPINE WITHOUT CONTRAST  TECHNIQUE:  Multidetector CT imaging of the lumbar spine was performed without  intravenous contrast administration. Multiplanar CT image  reconstructions were also generated.  COMPARISON: None.  FINDINGS:  There is no fracture or spondylolisthesis. There is moderately  severe disc space narrowing at L5-S1 with slight vacuum phenomenon  at this level. There is moderate narrowing at L4-5. There are  prominent anterior osteophytes at L2, L3, L4, and L5.  At T12-L1, there is moderate facet hypertrophy bilaterally. There is  no nerve root edema or effacement. No disc  extrusion or stenosis.  At L1-2, there is moderate facet hypertrophy bilaterally. There is  mild diffuse disc bulging. There is no nerve root edema or  effacement. No disc extrusion or stenosis.  At L2-3, there is broad-based disc bulging and moderate facet  hypertrophy bilaterally. There is borderline narrowing of the thecal  sac with borderline spinal stenosis. No disc extrusion.  At  L3-4, there is severe facet osteoarthritic change bilaterally,  more pronounced on the left than on the right. There is broad-based  disc bulging and moderate ligamentum flavum hypertrophy. These  findings in concert lead to moderate generalized spinal stenosis.  At L4-5, there is broad-based disc protrusion and severe facet  osteoarthritic change bilaterally. These findings in concert lead to  moderate generalized spinal stenosis. No frank disc extrusion.  At L5-S1, there is severe facet osteoarthritic change on the left  and moderate facet osteoarthritic change on the right. There is  broad-based disc protrusion. There is exit foraminal narrowing  bilaterally, more severe on the left than on the right, due to  osteophytic change. No frank disc extrusion or stenosis is seen at  this level.  There are no paraspinous lesions.  IMPRESSION:  Spinal stenosis at L3-4 and L4-5, multifactorial. Borderline spinal  stenosis at L2-3. There does appear to be a degree of congenital  narrowing of the canal which contributes to spinal stenosis.  Multilevel arthropathy.  No frank disc extrusion.  No fracture or spondylolisthesis.  Electronically Signed  By: Lowella Grip M.D.  On: 12/09/2013 13:50   CXR 9/13:  CLINICAL DATA: Upper back pain for 5 weeks. Dyspnea on exertion.  Nonsmoker. History of hypertension.  EXAM:  CHEST 2 VIEW  COMPARISON: 04/15/2006  FINDINGS:  The heart is mildly enlarged. There is elevation of right  hemidiaphragm, stable in appearance. There is streaky density  favored to be  within the right lower lobe, consistent with  atelectasis or infiltrate. No pulmonary edema. No pleural effusions.  There are moderate degenerative changes in the thoracic spine.  IMPRESSION:  Right lower lobe atelectasis or infiltrate.  Electronically Signed  By: Shon Hale M.D.  On: 12/04/2013 19:26  Venous Duplex 9/24:  Summary:  - No evidence of deep vein thrombosis involving the right lower extremity. - No evidence of deep vein thrombosis involving the left lower extremity.   Surgical Pathology Report: 12/21/2013 Diagnosis Soft tissue mass, simple excision, Thoracic Epidural Tumor - METASTATIC CARCINOMA. PLEASE SEE COMMENT. Microscopic Comment There are clusters of malignant cells with abundant eosinophilic cytoplasm. A battery of immunostains were performed and the tumor cells show the following profile: Cytokeratin 7 - strongly positive Cytokeratin 8/18 - strongly positive TTF-1 - strongly positive. Thyroglobulin - negative CD10 - negative Cytokeratin 20 - negative S100 - negative Vimentin - negative The control stained appropriately. The overall findings are diagnostic for metastatic carcinoma. 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 However, a metastatic oncocytic thyroid carcinoma is still in the differential. Clinical and radiographic correlation is highly recommended. (HCL:kh 12-19-13) Aldona Bar MD Pathologist, Electronic Signature (Case signed 12/20/2013)  Elberta Leatherwood, MD 12/23/2013, 1:46 PM PGY-1, [place] [facility] pager: [phone], text pages welcome

## 2013-12-24 LAB — CBC
HCT: 37.8 % (ref 36.0–46.0)
Hemoglobin: 12.7 g/dL (ref 12.0–15.0)
MCH: 29.2 pg (ref 26.0–34.0)
MCHC: 33.6 g/dL (ref 30.0–36.0)
MCV: 86.9 fL (ref 78.0–100.0)
PLATELETS: 266 10*3/uL (ref 150–400)
RBC: 4.35 MIL/uL (ref 3.87–5.11)
RDW: 14.6 % (ref 11.5–15.5)
WBC: 13.8 10*3/uL — AB (ref 4.0–10.5)

## 2013-12-24 LAB — GLUCOSE, CAPILLARY
Glucose-Capillary: 272 mg/dL — ABNORMAL HIGH (ref 70–99)
Glucose-Capillary: 309 mg/dL — ABNORMAL HIGH (ref 70–99)
Glucose-Capillary: 281 mg/dL — ABNORMAL HIGH (ref 70–99)
Glucose-Capillary: 338 mg/dL — ABNORMAL HIGH (ref 70–99)

## 2013-12-24 LAB — HEPARIN LEVEL (UNFRACTIONATED): Heparin Unfractionated: 0.43 IU/mL (ref 0.30–0.70)

## 2013-12-24 MED ORDER — DEXAMETHASONE 4 MG PO TABS
4.0000 mg | ORAL_TABLET | Freq: Two times a day (BID) | ORAL | Status: DC
  Administered 2013-12-24 – 2013-12-26 (×4): 4 mg via ORAL
  Filled 2013-12-24 (×4): qty 1

## 2013-12-24 MED ORDER — OXYCODONE HCL 5 MG PO TABS
5.0000 mg | ORAL_TABLET | ORAL | Status: DC | PRN
  Administered 2013-12-24 – 2014-01-09 (×58): 5 mg via ORAL
  Filled 2013-12-24 (×59): qty 1

## 2013-12-24 NOTE — Progress Notes (Signed)
Seen and examined.  Agree with the documentation and management of Dr. Berkley Harvey.  Resting comfortably.  Thyroid bx went well, path pending.  Likely to OR next week per NS.  Not much movement in legs.

## 2013-12-24 NOTE — Progress Notes (Signed)
ANTICOAGULATION CONSULT NOTE - Follow Up Consult  Pharmacy Consult:  Heparin Indication: pulmonary embolus  Allergies  Allergen Reactions  . Tramadol Nausea And Vomiting    Patient Measurements: Height: 5\' 11"  (180.3 cm) Weight: 313 lb 15 oz (142.4 kg) IBW/kg (Calculated) : 70.8 Heparin Dosing Weight: 104 kg  Vital Signs: Temp: 97.5 F (36.4 C) (10/03 0917) Temp Source: Oral (10/03 0917) BP: 129/79 mmHg (10/03 0917) Pulse Rate: 83 (10/03 0917)  Labs:  Recent Labs  12/22/13 0716 12/23/13 0456 12/23/13 1344 12/24/13 0426  HGB 12.5 13.2  --  12.7  HCT 36.8 38.9  --  37.8  PLT 265 264  --  266  APTT 48*  --   --   --   LABPROT 13.5  --   --   --   INR 1.03  --   --   --   HEPARINUNFRC 0.30 0.42  --  0.43  CREATININE  --   --  0.71  --     Estimated Creatinine Clearance: 136.4 ml/min (by C-G formula based on Cr of 0.71).   Assessment: 48 YO F admitted 12/12/13 with lower extremity weakness and found to have mass at T-3 level with spinal compression. She had surgery on 12/15/13 for decompression and biopsy.  Pt also found to have a PE on CT chest and started on IV heparin 48 hours post-operatively.  Lower extremity Venous Duplex 12/15/13 negative for DVT.  Hypercoagulable state likely from cancer - awaiting biopsy results for primary source.  Heparin level today = 0.43 on 1600 units/hr.  S/p thyroid biopsy 12/23/13.  CBC trending down however, no bleeding reported.  Goal of Therapy:  Heparin level 0.3-0.7 units/ml Monitor platelets by anticoagulation protocol: Yes    Plan:  1. Continue heparin at 1600 units/hr 2. Daily heparin level and CBC 3. Follow up plans for long term anticoagulation.  Long-term Lovenox is preferred based on the oncology guidelines.  May want to discuss this possibility with Dr. Jana Hakim and the patient.  May need to check insurance coverage/copay for long-term Lovenox.  Dose would be 150mg  SQ q12h.    Eudelia Bunch,  Pharm.D. [phone] 12/24/2013 10:28 AM

## 2013-12-24 NOTE — Progress Notes (Signed)
No change in status. Awaiting thoracic stabilization next week per Dr. Christella Noa

## 2013-12-24 NOTE — Progress Notes (Signed)
Family Medicine Teaching Service Daily Progress Note Intern Pager: [phone]  Patient name: Sarah [name] Medical record number: [id number] Date of birth: 15-Apr-1965 Age: 48 y.o. Gender: female  Primary Care Provider: No PCP Per Patient Consultants: Neurosurgery, IR, Oncology Code Status: Full  Pt Overview and Major Events to Date:  - 9/22: Pt. Admitted with lower extremity weakness.  - 9/23: Pt. Found to have mass at T-3 level with spinal compression.  - 9/24: Surgery performed for decompression and biopsy overnight. Pt. Post-op and doing well. 3cm Suprahilar mass on CT Chest, PE also identified. ?Thyroid etiology, but much less likely for mets. Oncology consulted.  - 9/25 - 9/29: Awaiting pathology results with plan for oncology to see once path returns. Pt. With little neurological improvement. Started with light PT.  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 Oncology on board. Neurosurgery to comment on need for spinal stabilization surgery.  - 10/1: Radiology to perform thyroid US; Heterogeneous throughout, cannot r/o malignancy - 10/2: Thyroid Bx today; awaiting results.  Assessment and Plan: Sarah [name] is a 48 y.o. female presenting with continued lower extremity weakness at baseline after discharge from hospital on 9/21. PMH is significant for obesity, HTN, and ?History of Hypothyroidism previously on Synthroid per patient.   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  Neurosurgery took her to the OR on 9/23, and biopsied as well as performed laminectomy for decompression. 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 Overall this indicates a likely very poor prognosis. 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  - PT recommendations SNF with 24hour supervision. Will plan dispo based on goals of care discussion.   Discussed Recs for PT with Neurosurgery. Light PT at this point. Pt. Not to get UOB.  - Pain control:  Tylenol and Oxy IR prn - Foley catheter in place for urinary retention; Monitor for fecal retention - pt. Continues to have BM on her own.   - - Neurosurgery following: Dr. Christella Noa states will take to OR as early as next week.   Tapering Decadron:  77m BID today  - Oncology following: Goals of care to be discussed with possible outpatient treatment.   # Thyroid Nodule - Large thyroid goiter noted. TSH normal; ultrasound (10/1) showing diffuse heterogenicity, cannot r/o malignancy, Biopsy on 10/2 - FNA results pending  #Pulmonary Embolus - Found on CT Chest  Pt. With brief episodes of tachycardia, but otherwise asymptomatic with O2 sats 100% on 2L [place].  Blood pressures stable. Lower extremity Venous Duplex negative for DVT.  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 ESR elevated.  - Now on IV heparin (waited 48 hrs post surgery) - Will continue to monitor VS. Stable at this time. - Continue heparin for now given the potential for surgery.  - Long-term Lovenox is preferred based on the oncology guidelines; Dose would be 1537mSQ q12h.   #Hyperglycemia -  Likely diabetic with PCOS given A1C of 6.7. CBG's 200's - 300.  - Start on Sensitive SSI for glucose control in the hospital.  - Patient informed of elevated A1C and likely diagnosis of DMII. She agrees with [place] this is a marginal issue at this point, and we will recommend treatment based upon our goals of care discussion.  -  Poor control with CBG's still 200-300. Moderate SSI. Consider backing off on insulin dose as decadron is tapered down.  - Continue to monitor.  - Moderate SSI; 10U Lantus.  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 PCOS  vs. Adrenal patholog. Would be unlike to be first presentation of HPA pathology; no evidence of salt wasting on labs. Hypertensive and not hypotensive. Testing during this hospitalization after having recently received steroids would be largely non-diagnostic. Though, could also be a combination of isolated processes i.e. Clitoromegaly, Hirsutism 2/2 obesity. - Recent thought of outpt vs inpt w/up including [place] to assess ovaries vs 21-hydroxylase/ACTH/cortisol.  - TSH, Cortisol WNL - A1C : 6.7 - consistent with PCOS given evidence of cystic structures in pelvis.   # UTI, 2/2 foley catheter - UCx = Proteus  - dysuria and suprapubic pain noted PM 10/1. - Cipro 237m BID (10/2>>) Day 2 of 3  #HTN- BP fairly well controlled -continue losartan.  - Blood pressure well controlled here.     #Obesity-  - consistent with potential PCOS.   FEN/GI: Reg diet; KVO; s/p Enema with some relief Prophylaxis: on full dose IV heparin  Disposition: Pending Goals of Care discussion. Neurosurgery to address need for surgical spinal stabilization.   Subjective:  Pt. Continues to have some back pain which is controlled with tylenol and Vicodin. Not constipated. Working with PT. No questions.   Objective: Temp:  [97.5 F (36.4 C)-98 F (36.7 C)] 97.5 F (36.4 C) (10/03 0917) Pulse Rate:  [72-89] 83 (10/03 0917) Resp:  [18] 18 (10/03 0917) BP: (123-130)/(72-85) 129/79 mmHg (10/03 0917) SpO2:  [95 %-99 %] 98 % (10/03 0917) Physical Exam: General: NAD, AAOx3, morbidly obese  HEENT: Thyromegaly / Goiter noted on exam without tenderness, and no nodules, no LAD, neck supple; significant adipose tissue at base of posterior neck ("hump") Abdomen: Soft and nontender, mildly distended, No organomegaly noted.  Extremities: no edema,  No calf tenderness, 2+ distal pulses bilaterally.    Skin: No lesions or rashes noted, incision over T-spine C/D/I Neuro: cranial nerves grossly normal. Normal speech; 0/5   Plantarflexion BL feet. 0/5 Hip Flexor / extensor movement. Full; sensation to light touch, possibly L>R. + babinski bilaterally, and hyperreflexivity noted. Neuro exam is at new baseline. No new changes. No sensory deficits, and poor motor exam.   Laboratory:  Recent Labs Lab 12/22/13 0716 12/23/13 0456 12/24/13 0426  WBC 17.7* 18.1* 13.8*  HGB 12.5 13.2 12.7  HCT 36.8 38.9 37.8  PLT 265 264 266    Recent Labs Lab 12/20/13 0720 12/21/13 0601 12/23/13 1344  NA 133* 131* 132*  K 4.9 4.6 5.1  CL 91* 93* 93*  CO2 _0 BUN 26* 24* 31*  CREATININE 0.77 0.70 0.71  CALCIUM 9.2 9.0 9.0  GLUCOSE 245* 263* 214*   Urinalysis    Component Value Date/Time   COLORURINE AMBER* 12/23/2013 1058   APPEARANCEUR TURBID* 12/23/2013 1058   LABSPEC 1.018 12/23/2013 1058   PHURINE 7.5 12/23/2013 1058   GLUCOSEU NEGATIVE 12/23/2013 1058   HGBUR LARGE* 12/23/2013 1058   BILIRUBINUR NEGATIVE 12/23/2013 1058   KETONESUR NEGATIVE 12/23/2013 1058   PROTEINUR 100* 12/23/2013 1058   UROBILINOGEN 0.2 12/23/2013 1058   NITRITE NEGATIVE 12/23/2013 1058   LEUKOCYTESUR LARGE* 12/23/2013 1058    Phos - 3.9 Mg - 2.2 CRP - 11.6 ESR - 59 CK - 56 B12 - 209 Random Cortisol -  25.4 A1C - 6.7 TSH - 2.13 RPR - nonreactive HIV - nonreactive UPT - Negative  Imaging/Diagnostic Tests:  CT Chest Abd/ Pelvis 9/23:  CLINICAL DATA: Destructive metastatic lesion of the T3 vertebral  body with no known primary carcinoma.  EXAM:  CT CHEST, ABDOMEN, AND PELVIS WITH CONTRAST  TECHNIQUE:  Multidetector CT imaging of the chest, abdomen and pelvis was  performed following the standard protocol during bolus  administration of intravenous contrast.  CONTRAST: 129m OMNIPAQUE IOHEXOL 300 MG/ML SOLN  COMPARISON: MRI of the cervical spine earlier today.  FINDINGS:  CT CHEST FINDINGS  Within the chest, a right suprahilar mass measures approximately 3.0  x 2.7 x 2.2 cm. This either represents a nodal metastasis or  central  lung mass such as small cell carcinoma. A number of smaller  mediastinal lymph nodes are also identified in the right  paratracheal region, prevascular space and AP window.  Destructive lesion of the T3 vertebral body noted which nearly  replaces the entire vertebral body with tumor extension posteriorly  into the epidural space causing significant compression of the  spinal cord. Paravertebral extension of tumor is also suspected  lateral and anterior to the vertebral body, right greater than left.  Massive enlargement of the thyroid gland present with transverse  dimensions of approximately 7.7 x 8.5 cm. The thyroid gland is  incompletely visualized as the neck was not included. Central  dystrophic calcification identified. The enlarged thyroid gland does  cause significant mass effect on the airway with evidence of  tracheal deviation to the right and tracheal narrowing at the  thoracic inlet. The enlarged thyroid gland extends into the superior  mediastinum.  There is evidence of pulmonary embolism with nonocclusive thrombus  identified in the right middle lobe and lower lobe pulmonary  arteries. Pulmonary arterial opacification is not optimal on the  standard venous phase evaluation of the chest but the right sided  pulmonary embolism is clearly present.  There are small bilateral pulmonary nodules. Right-sided pulmonary  nodule measuring 6 mm likely localizes to the very top of the lower  lobe near the major fissure. Left upper lobe nodule measures 7 mm.  Lingular nodule measures 7 mm. Left lower lobe nodule measures 7 mm.  Faint peripheral right middle lobe nodule measures 3 mm. Inferior  right middle lobe nodule measures 6 mm. Several small nodules in the  posterior right upper lobe identified in the 3-4 mm range.  No evidence of pleural or pericardial fluid. No other bony lesions  identified in the thorax.  CT ABDOMEN AND PELVIS FINDINGS  Small low-density lesion in  the left lobe of the liver measures 7 mm  and likely represents a cyst. No other hepatic abnormalities. The  gallbladder, pancreas, spleen, adrenal glands and kidneys are  unremarkable. No adenopathy is identified in the abdomen or pelvis.  Bowel shows normal caliber without evidence of obstruction, lesion  or inflammation. No abnormal fluid collections.  The bladder is moderately distended. Elongated cystic changes in  both adnexal regions identified which may represent a combination of  ovarian cysts and potentially hydrosalpinx. Correlation with pelvic  ultrasound is recommended. No hernias are identified. The uterus  appears unremarkable by CT. No bony lesions are identified in the  abdomen or pelvis.  IMPRESSION:  1. 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  2. 3 cm right suprahilar mass may represent  nodal metastasis versus  primary central tumor. Next number multiple small bilateral  pulmonary nodules consistent with metastatic disease.  3. Acute nonocclusive pulmonary embolism in the right middle lobe  and right lower lobe pulmonary arteries.  4. Massive thyroid goiter causing tracheal deviation to the right  and narrowing of the trachea.  5. Tubular cystic abnormalities of both adnexal regions which may be  a combination of cystic changes in the ovaries and potentially  hydrosalpinx. Correlation recommended with pelvic ultrasound to  exclude any suspicious cystic lesions of the adnexae.  6. Critical Value/emergent results were called by telephone at the  time of interpretation on 12/14/2013 at 4:52 pm to Dr. Tamala Julian of the  [facility] Medicine Teaching Service, who verbally acknowledged these  results.  Electronically Signed  By: Aletta Edouard M.D.  On: 12/14/2013 16:53   MRI T-spine 9/23:  CLINICAL DATA: Lower extremity weakness. Worsening back  pain.  Compressive upper thoracic mass partially visualized on cervical  spine MRI as well as chest CT with spinal cord compression.  EXAM:  MRI THORACIC SPINE WITHOUT CONTRAST  TECHNIQUE:  Multiplanar, multisequence MR imaging of the thoracic spine was  performed. No intravenous contrast was administered.  COMPARISON: Cervical spine MRI and chest CT earlier today  FINDINGS:  There is a T3 compression fracture with approximately 50% height  loss centrally. As described on cervical spine MRI and chest CT  earlier today, there is abnormal soft tissue replacing the majority  of the T3 vertebral body extending into the pedicles as well as into  the ventral epidural space with moderate cord compression. Epidural  extension is slightly greater on the right and extends into the  inferior aspect of the right T2-3 neural foramen as well as superior  aspect of the right T3-4 neural foramen. There is also abnormal  paravertebral soft tissue laterally and anteriorly as seen on CT.  Central ventral epidural material posterior to the T4 vertebral body  may represent tumor or hematoma. Mild T2 hyperintensity is present  in the spinal cord at the T3 level extending inferiorly to the T4-5  disc space level consistent with mild edema. Mild edema is present  in the posterior paraspinal soft tissues of the upper thoracic  spine. No other vertebral body lesions are identified.  There is a moderate-sized central disc extrusion at T7-8 which  contacts and mildly flattens the ventral spinal cord with mild  spinal stenosis. A small left central disc protrusion is present at  T8-9 with at most minimal impression on the spinal cord. Massive  enlargement of the left thyroid lobe is partially visualized.  Mediastinal and lung findings are better demonstrated on recent  chest CT.  IMPRESSION:  1. T3 vertebral body mass with pathologic compression fracture and  epidural tumor resulting in moderate cord  compression with evidence  of mild spinal cord edema.  2. T7-8 disc extrusion resulting in mild spinal stenosis.   MRI Head / C-Spine 9/23:  CLINICAL DATA: 48 year old female who presented with back pain and  lower extremity numbness, with continued inadequately explained  lower extremity weakness following Hospital discharge on 12/12/2013.  Initial encounter.  EXAM:  MRI HEAD WITHOUT AND WITH CONTRAST  MRI CERVICAL SPINE WITHOUT AND WITH CONTRAST  TECHNIQUE:  Multiplanar, multiecho pulse sequences of the brain and surrounding  structures, and cervical spine, to include the craniocervical  junction and cervicothoracic junction, were obtained without and  with intravenous contrast.  CONTRAST: 20 mL MultiHance.  COMPARISON: Lumbar MRI 12/10/2013.  FINDINGS:  MRI HEAD FINDINGS  Cerebral volume is normal. No restricted diffusion to suggest acute  infarction. No midline shift, mass effect, evidence of mass lesion,  ventriculomegaly, extra-axial collection or acute intracranial  hemorrhage. Cervicomedullary junction and pituitary are within  normal limits. Major intracranial vascular flow voids are preserved.  Pearline Cables and white matter signal is within normal limits for age  throughout the brain. No abnormal enhancement identified, pulsation  artifact prominent on axial post-contrast images.  Large body habitus. Small right frontal scalp lipoma (series 10,  image 37). Visible internal auditory structures appear normal. Mild  right mastoid fluid. Minor paranasal sinus mucosal thickening.  Visualized orbit soft tissues are within normal limits. Partially  visible thyromegaly greater on the left, see below. Normal bone  marrow signal.  MRI CERVICAL SPINE FINDINGS  Large body habitus resulting in wrap artifact on axial images.  However, there is massive thyroid enlargement evident, with the left  lobe measuring at least 7.2 x 5.2 x 11.2 cm (AP by transverse by  CC). The trachea is displaced  to the right at the thoracic inlet and  mildly compressed. There is other regional mass effect including  sheath. On the left carotid  Fairly preserved cervical lordosis. No marrow edema or evidence of  acute osseous abnormality. Cervicomedullary junction is within  normal limits. Spinal cord signal is within normal limits at all  visualized levels. No abnormal intradural enhancement.  C2-C3: Negative.  C3-C4: Disc osteophyte complex, facet and ligament flavum  hypertrophy. Along with a degree of congenital canal narrowing,  there is mild spinal stenosis and moderate biforaminal stenosis.  C4-C5: Mild uncovertebral and facet hypertrophy. Mild right C5  foraminal stenosis.  C5-C6: Circumferential disc osteophyte complex. Along with a degree  of congenital canal narrowing there is mild spinal stenosis.  Moderate right C6 foraminal stenosis.  C6-C7: Negative except for mild facet hypertrophy.  C7-T1: Negative.  T1-T2: Negative.  Faintly visualized at the T3 level in the upper thoracic spine there  is a lobulated solidly enhancing mass which appears to extend into  the epidural space from the T3 vertebral body. This is inseparable  from the spinal cord which is likely compressed. See series 2000  image 7 and series 1500, image 6. This is not included on axial  images.  IMPRESSION:  1. Faintly visible but aggressive appearing enhancing mass at the T3  level of the upper thoracic spine, likely with spinal cord  compression evaluate further are with thoracic MRI without and with  contrast.  2. Normal for age MRI appearance of the brain.  3. Massive thyroid enlargement, greater on the left. Left thyroid  goiter/mass measuring at least 7.2 x 5.2 x 11.2 cm. Mass effect at  the thoracic inlet including on the trachea.  4. Mild chronic disc and endplate degeneration in the cervical spine  at C3-C4 and C5-C6 resulting in borderline to mild spinal stenosis,  but no cervical spinal cord mass  effect or signal abnormality.  Electronically Signed:  By: Lars Pinks M.D.  On: 12/14/2013 09:50   MRI L-Spine 9/19:  CLINICAL DATA: Back pain with bilateral leg numbness, worsening  with oral steroids.  EXAM:  MRI LUMBAR SPINE WITHOUT CONTRAST  TECHNIQUE:  Multiplanar, multisequence MR imaging of the lumbar spine was  performed. No intravenous contrast was administered.  COMPARISON: CT 12/09/2013  FINDINGS:  There is no degenerative disease at L2-3 or above. The discs are  normal in signal characteristics and morphology. The spinal canal  and foramina are widely patent. The distal cord and conus are normal  with conus tip at L1. There are small benign and insignificant  hemangiomas within the L2 and L3 vertebral bodies.  L3-4: Minimal desiccation of the disc. Mild circumferential bulging  slightly more prominent towards the left. Bilateral facet  degeneration and hypertrophy. Mild narrowing of the lateral recesses  without apparent neural compression. Mild foraminal narrowing on the  left without apparent neural compression.  L4-5: Desiccation of the disc with circumferential protrusion.  Bilateral facet and ligamentous hypertrophy. Stenosis of both  lateral recesses without definite focal neural compression. Mild  foraminal narrowing bilaterally.  L5-S1: Desiccation of the disc with loss of height. Endplate  osteophytes and bulging of the disc more prominent towards the left.  Bilateral facet degeneration and hypertrophy left more than right.  Stenosis of the subarticular lateral recess on the left that could  compress the S1 nerve root. Mild foraminal narrowing left more than  right but without apparent compression of the exiting L5 nerve  roots.  Serpiginous fluid-filled structure in the left pelvis could  represent hydrosalpinx. This is not primarily or completely imaged.  IMPRESSION:  Degenerative disc disease and degenerative facet disease in the  lower lumbar spine that  could be a cause of lumbago.  Lateral recess and foraminal narrowing at L3-4, L4-5 and L5-S1 as  described above that could be a cause of focal neural compression.  In general, this is more pronounced on the left than on the right.  In particular, there is potential for left S1 nerve root compression  in the subarticular lateral recess at L5-S1.  Electronically Signed  By: Nelson Chimes M.D.  On: 12/10/2013 08:45   CT L-Spine 9/18:  CLINICAL DATA: Low back pain with difficulty walking.  EXAM:  CT LUMBAR SPINE WITHOUT CONTRAST  TECHNIQUE:  Multidetector CT imaging of the lumbar spine was performed without  intravenous contrast administration. Multiplanar CT image  reconstructions were also generated.  COMPARISON: None.  FINDINGS:  There is no fracture or spondylolisthesis. There is moderately  severe disc space narrowing at L5-S1 with slight vacuum phenomenon  at this level. There is moderate narrowing at L4-5. There are  prominent anterior osteophytes at L2, L3, L4, and L5.  At T12-L1, there is moderate facet hypertrophy bilaterally. There is  no nerve root edema or effacement. No disc extrusion or stenosis.  At L1-2, there is moderate facet hypertrophy bilaterally. There is  mild diffuse disc bulging. There is no nerve root edema or  effacement. No disc extrusion or stenosis.  At L2-3, there is broad-based disc bulging and moderate facet  hypertrophy bilaterally. There is borderline narrowing of the thecal  sac with borderline spinal stenosis. No disc extrusion.  At L3-4, there is severe facet osteoarthritic change bilaterally,  more pronounced on the left than on the right. There is broad-based  disc bulging and moderate ligamentum flavum hypertrophy. These  findings in concert lead to moderate generalized spinal stenosis.  At L4-5, there is broad-based disc protrusion and severe facet  osteoarthritic change bilaterally. These findings in concert lead to  moderate generalized  spinal stenosis. No frank disc extrusion.  At L5-S1, there is severe facet osteoarthritic change on the left  and moderate facet osteoarthritic change on the right. There is  broad-based disc protrusion. There is exit foraminal narrowing  bilaterally, more severe on the left than on the right, due to  osteophytic change. No frank disc extrusion or stenosis is seen at  this level.  There are no paraspinous lesions.  IMPRESSION:  Spinal stenosis at L3-4 and L4-5, multifactorial. Borderline spinal  stenosis at L2-3. There does appear to be a degree of congenital  narrowing of the canal which contributes to spinal stenosis.  Multilevel arthropathy.  No frank disc extrusion.  No fracture or spondylolisthesis.  Electronically Signed  By: Lowella Grip M.D.  On: 12/09/2013 13:50   CXR 9/13:  CLINICAL DATA: Upper back pain for 5 weeks. Dyspnea on exertion.  Nonsmoker. History of hypertension.  EXAM:  CHEST 2 VIEW  COMPARISON: 04/15/2006  FINDINGS:  The heart is mildly enlarged. There is elevation of right  hemidiaphragm, stable in appearance. There is streaky density  favored to be within the right lower lobe, consistent with  atelectasis or infiltrate. No pulmonary edema. No pleural effusions.  There are moderate degenerative changes in the thoracic spine.  IMPRESSION:  Right lower lobe atelectasis or infiltrate.  Electronically Signed  By: Shon Hale M.D.  On: 12/04/2013 19:26  Venous Duplex 9/24:  Summary:  - No evidence of deep vein thrombosis involving the right lower extremity. - No evidence of deep vein thrombosis involving the left lower extremity.   Surgical Pathology Report: 12/21/2013 Diagnosis Soft tissue mass, simple excision, Thoracic Epidural Tumor - METASTATIC CARCINOMA. PLEASE SEE COMMENT. Microscopic Comment There are clusters of malignant cells with abundant eosinophilic cytoplasm. A battery of immunostains were performed and the tumor cells show the  following profile: Cytokeratin 7 - strongly positive Cytokeratin 8/18 - strongly positive TTF-1 - strongly positive. Thyroglobulin - negative CD10 - negative Cytokeratin 20 - negative S100 - negative Vimentin - negative The control stained appropriately. The overall findings are diagnostic for metastatic carcinoma. 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 However, a metastatic oncocytic thyroid carcinoma is still in the differential. Clinical and radiographic correlation is highly recommended. (HCL:kh 12-19-13) Aldona Bar MD Pathologist, Electronic Signature (Case signed 12/20/2013)  Olam Idler, MD 12/24/2013, 10:52 AM PGY-2, [place] Intern pager: [phone], text pages welcome

## 2013-12-25 DIAGNOSIS — D492 Neoplasm of unspecified behavior of bone, soft tissue, and skin: Secondary | ICD-10-CM

## 2013-12-25 LAB — CBC
HCT: 35.2 % — ABNORMAL LOW (ref 36.0–46.0)
Hemoglobin: 11.8 g/dL — ABNORMAL LOW (ref 12.0–15.0)
MCH: 28 pg (ref 26.0–34.0)
MCHC: 33.5 g/dL (ref 30.0–36.0)
MCV: 83.6 fL (ref 78.0–100.0)
PLATELETS: 291 10*3/uL (ref 150–400)
RBC: 4.21 MIL/uL (ref 3.87–5.11)
RDW: 14.4 % (ref 11.5–15.5)
WBC: 12.6 10*3/uL — AB (ref 4.0–10.5)

## 2013-12-25 LAB — URINE CULTURE

## 2013-12-25 LAB — GLUCOSE, CAPILLARY
GLUCOSE-CAPILLARY: 352 mg/dL — AB (ref 70–99)
Glucose-Capillary: 256 mg/dL — ABNORMAL HIGH (ref 70–99)
Glucose-Capillary: 275 mg/dL — ABNORMAL HIGH (ref 70–99)
Glucose-Capillary: 283 mg/dL — ABNORMAL HIGH (ref 70–99)

## 2013-12-25 LAB — HEPARIN LEVEL (UNFRACTIONATED): Heparin Unfractionated: 0.56 IU/mL (ref 0.30–0.70)

## 2013-12-25 NOTE — Progress Notes (Signed)
Family Medicine Teaching Service Daily Progress Note Intern Pager: [phone]  Patient name: Sarah [name] Medical record number: [id number] Date of birth: 1965-06-28 Age: 48 y.o. Gender: female  Primary Care Provider: No PCP Per Patient Consultants: Neurosurgery, IR, Oncology Code Status: Full  Pt Overview and Major Events to Date:  - 9/22: Pt. Admitted with lower extremity weakness.  - 9/23: Pt. Found to have mass at T-3 level with spinal compression.  - 9/24: Surgery performed for decompression and biopsy overnight. Pt. Post-op and doing well. 3cm Suprahilar mass on CT Chest, PE also identified. ?Thyroid etiology, but much less likely for mets. Oncology consulted.  - 9/25 - 9/29: Awaiting pathology results with plan for oncology to see once path returns. Pt. With little neurological improvement. Started with light PT.  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 Oncology on board. Neurosurgery to comment on need for spinal stabilization surgery.  - 10/1: Radiology to perform thyroid US; Heterogeneous throughout, cannot r/o malignancy - 10/2: Thyroid Bx; results pending - 10/3: Decadron 31m BID: would appreciate NeuroSurg imput on this medication's course of action.   Assessment and Plan: Sarah MUNARis a 48y.o. female presenting with continued lower extremity weakness at baseline after discharge from hospital on 9/21. PMH is significant for obesity, HTN, and ?History of Hypothyroidism previously on Synthroid per patient.   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  Neurosurgery took her to the OR on 9/23, and biopsied as well as performed laminectomy for decompression. 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 Overall this indicates a likely very poor  prognosis. 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  - PT recommendations SNF with 24hour supervision. Will plan dispo based on goals of care discussion.   Discussed Recs for PT with Neurosurgery. Light PT at this point. Pt. Not to get UOB.  - Pain control:  Oxy IR prn - Foley catheter in place for urinary retention; Monitor for fecal retention - pt. Continues to have BM on her own.   - - Neurosurgery following: Dr. CChristella Noastates will take to OR as early as next week.   Tapering Decadron:  10/3  [id number] (began on 9/24 at 107m57m6hr); will continue to taper - Oncology following: Goals of care to be discussed with possible outpatient treatment.   # Thyroid Nodule - Large thyroid goiter noted. TSH normal; ultrasound (10/1) showing diffuse heterogenicity, cannot r/o malignancy, Biopsy on 10/2 - FNA results pending  #Pulmonary Embolus - Found on CT Chest  Pt. With brief episodes of tachycardia, but otherwise asymptomatic with O2 sats 100% on 2L [place].  Blood pressures stable. Lower extremity Venous Duplex negative for DVT.  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 ESR elevated.  - Now on IV heparin -- Will begin bridging as soon as possible (post surgical) - Will continue to monitor VS. Stable at this time. - Continue heparin for now given the potential for surgery.  - Long-term Lovenox is preferred based on the oncology guidelines; Dose would be 150m37m q12h.   #Hyperglycemia -  Likely diabetic with PCOS given A1C of 6.7. CBG's 200's - 300.  - Start on Sensitive SSI for glucose control in the hospital.  - Patient informed of elevated A1C and likely diagnosis  of DMII. She agrees with [place] that this is a marginal issue at this point, and we will recommend treatment based upon our goals of care discussion.  - Poor control with CBG's still 200-300. Moderate SSI. Consider backing off on insulin dose as decadron is tapered down.  - Continue  to monitor.  - Moderate SSI; 10U Lantus.  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 PCOS vs. Adrenal patholog. Would be unlike to be first presentation of HPA pathology; no evidence of salt wasting on labs. Hypertensive and not hypotensive. Testing during this hospitalization after having recently received steroids would be largely non-diagnostic. Though, could also be a combination of isolated processes i.e. Clitoromegaly, Hirsutism 2/2 obesity. - Recent thought of outpt vs inpt w/up including [place] to assess ovaries vs 21-hydroxylase/ACTH/cortisol.  - TSH, Cortisol WNL - A1C : 6.7 - consistent with PCOS given evidence of cystic structures in pelvis.   # UTI, 2/2 foley catheter - UCx = Proteus  - dysuria and suprapubic pain noted PM 10/1. - Cipro 252m BID (10/2>>) Day 3 of 3  #HTN- BP fairly well controlled -continue losartan.  - Blood pressure well controlled here.     #Obesity-  - consistent with potential PCOS.   FEN/GI: Reg diet; KVO; s/p Enema with some relief Prophylaxis: on full dose IV heparin  Disposition: Pending Goals of Care discussion. Neurosurgery to address need for surgical spinal stabilization.   Subjective:  Pt. Continues to have some back pain which is controlled w/ oxycodone. Received new mattress and states she is much more comfortable. No new issues today. No BM yesterday, but daily BMs prior to that. Will continue to monitor.  Objective: Temp:  [97.4 F (36.3 C)-98.7 F (37.1 C)] 98.7 F (37.1 C) (10/04 0927) Pulse Rate:  [74-101] 97 (10/04 0927) Resp:  [18-20] 20 (10/04 0927) BP: (127-139)/(77-94) 127/92 mmHg (10/04 0927) SpO2:  [94 %-100 %] 94 % (10/04 0927) Physical Exam: General: NAD, AAOx3, morbidly obese  HEENT: Thyromegaly / Goiter noted on exam without tenderness, and no nodules, no LAD, neck supple; significant adipose tissue at base of posterior neck ("hump") Abdomen: Soft  and nontender, mildly distended, No organomegaly noted.  Extremities: no edema,  No calf tenderness, 2+ distal pulses bilaterally.    Skin: No lesions or rashes noted, incision over T-spine C/D/I Neuro: cranial nerves grossly normal. Normal speech; 0/5  Plantarflexion BL feet. 0/5 Hip Flexor / extensor movement. Full; sensation to light touch, possibly L>R. + babinski bilaterally, and hyperreflexivity noted. Neuro exam is at new baseline. No new changes. No sensory deficits, and poor motor exam.   Laboratory:  Recent Labs Lab 12/23/13 0456 12/24/13 0426 12/25/13 0410  WBC 18.1* 13.8* 12.6*  HGB 13.2 12.7 11.8*  HCT 38.9 37.8 35.2*  PLT 264 266 291    Recent Labs Lab 12/20/13 0720 12/21/13 0601 12/23/13 1344  NA 133* 131* 132*  K 4.9 4.6 5.1  CL 91* 93* 93*  CO2 28 24 26   BUN 26* 24* 31*  CREATININE 0.77 0.70 0.71  CALCIUM 9.2 9.0 9.0  GLUCOSE 245* 263* 214*   Urinalysis    Component Value Date/Time   COLORURINE AMBER* 12/23/2013 1058   APPEARANCEUR TURBID* 12/23/2013 1058   LABSPEC 1.018 12/23/2013 1058   PHURINE 7.5 12/23/2013 1[place]10/04/2013 1058   HGBUR LARGE* 12/23/2013 1[place]10/04/2013 1[place]10/04/2013 1058  PROTEINUR 100* 12/23/2013 1058   UROBILINOGEN 0.2 12/23/2013 1058   NITRITE NEGATIVE 12/23/2013 1058   LEUKOCYTESUR LARGE* 12/23/2013 1058    Phos - 3.9 Mg - 2.2 CRP - 11.6 ESR - 59 CK - 56 B12 - 209 Random Cortisol - 25.4 A1C - 6.7 TSH - 2.13 RPR - nonreactive HIV - nonreactive UPT - Negative  Imaging/Diagnostic Tests:  CT Chest Abd/ Pelvis 9/23:  CLINICAL DATA: Destructive metastatic lesion of the T3 vertebral  body with no known primary carcinoma.  EXAM:  CT CHEST, ABDOMEN, AND PELVIS WITH CONTRAST  TECHNIQUE:  Multidetector CT imaging of the chest, abdomen and pelvis was  performed following the standard protocol during bolus  administration of intravenous contrast.  CONTRAST:  151m OMNIPAQUE IOHEXOL 300 MG/ML SOLN  COMPARISON: MRI of the cervical spine earlier today.  FINDINGS:  CT CHEST FINDINGS  Within the chest, a right suprahilar mass measures approximately 3.0  x 2.7 x 2.2 cm. This either represents a nodal metastasis or central  lung mass such as small cell carcinoma. A number of smaller  mediastinal lymph nodes are also identified in the right  paratracheal region, prevascular space and AP window.  Destructive lesion of the T3 vertebral body noted which nearly  replaces the entire vertebral body with tumor extension posteriorly  into the epidural space causing significant compression of the  spinal cord. Paravertebral extension of tumor is also suspected  lateral and anterior to the vertebral body, right greater than left.  Massive enlargement of the thyroid gland present with transverse  dimensions of approximately 7.7 x 8.5 cm. The thyroid gland is  incompletely visualized as the neck was not included. Central  dystrophic calcification identified. The enlarged thyroid gland does  cause significant mass effect on the airway with evidence of  tracheal deviation to the right and tracheal narrowing at the  thoracic inlet. The enlarged thyroid gland extends into the superior  mediastinum.  There is evidence of pulmonary embolism with nonocclusive thrombus  identified in the right middle lobe and lower lobe pulmonary  arteries. Pulmonary arterial opacification is not optimal on the  standard venous phase evaluation of the chest but the right sided  pulmonary embolism is clearly present.  There are small bilateral pulmonary nodules. Right-sided pulmonary  nodule measuring 6 mm likely localizes to the very top of the lower  lobe near the major fissure. Left upper lobe nodule measures 7 mm.  Lingular nodule measures 7 mm. Left lower lobe nodule measures 7 mm.  Faint peripheral right middle lobe nodule measures 3 mm. Inferior  right middle lobe nodule  measures 6 mm. Several small nodules in the  posterior right upper lobe identified in the 3-4 mm range.  No evidence of pleural or pericardial fluid. No other bony lesions  identified in the thorax.  CT ABDOMEN AND PELVIS FINDINGS  Small low-density lesion in the left lobe of the liver measures 7 mm  and likely represents a cyst. No other hepatic abnormalities. The  gallbladder, pancreas, spleen, adrenal glands and kidneys are  unremarkable. No adenopathy is identified in the abdomen or pelvis.  Bowel shows normal caliber without evidence of obstruction, lesion  or inflammation. No abnormal fluid collections.  The bladder is moderately distended. Elongated cystic changes in  both adnexal regions identified which may represent a combination of  ovarian cysts and potentially hydrosalpinx. Correlation with pelvic  ultrasound is recommended. No hernias are identified. The uterus  appears unremarkable by CT. No bony lesions are  identified in the  abdomen or pelvis.  IMPRESSION:  1. 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  2. 3 cm right suprahilar mass may represent nodal metastasis versus  primary central tumor. Next number multiple small bilateral  pulmonary nodules consistent with metastatic disease.  3. Acute nonocclusive pulmonary embolism in the right middle lobe  and right lower lobe pulmonary arteries.  4. Massive thyroid goiter causing tracheal deviation to the right  and narrowing of the trachea.  5. Tubular cystic abnormalities of both adnexal regions which may be  a combination of cystic changes in the ovaries and potentially  hydrosalpinx. Correlation recommended with pelvic ultrasound to  exclude any suspicious cystic lesions of the adnexae.  6. Critical Value/emergent results were called by telephone at the  time of interpretation on 12/14/2013 at  4:52 pm to Dr. Tamala Julian of the  [facility] Teaching Service, who verbally acknowledged these  results.  Electronically Signed  By: Aletta Edouard M.D.  On: 12/14/2013 16:53   MRI T-spine 9/23:  CLINICAL DATA: Lower extremity weakness. Worsening back pain.  Compressive upper thoracic mass partially visualized on cervical  spine MRI as well as chest CT with spinal cord compression.  EXAM:  MRI THORACIC SPINE WITHOUT CONTRAST  TECHNIQUE:  Multiplanar, multisequence MR imaging of the thoracic spine was  performed. No intravenous contrast was administered.  COMPARISON: Cervical spine MRI and chest CT earlier today  FINDINGS:  There is a T3 compression fracture with approximately 50% height  loss centrally. As described on cervical spine MRI and chest CT  earlier today, there is abnormal soft tissue replacing the majority  of the T3 vertebral body extending into the pedicles as well as into  the ventral epidural space with moderate cord compression. Epidural  extension is slightly greater on the right and extends into the  inferior aspect of the right T2-3 neural foramen as well as superior  aspect of the right T3-4 neural foramen. There is also abnormal  paravertebral soft tissue laterally and anteriorly as seen on CT.  Central ventral epidural material posterior to the T4 vertebral body  may represent tumor or hematoma. Mild T2 hyperintensity is present  in the spinal cord at the T3 level extending inferiorly to the T4-5  disc space level consistent with mild edema. Mild edema is present  in the posterior paraspinal soft tissues of the upper thoracic  spine. No other vertebral body lesions are identified.  There is a moderate-sized central disc extrusion at T7-8 which  contacts and mildly flattens the ventral spinal cord with mild  spinal stenosis. A small left central disc protrusion is present at  T8-9 with at most minimal impression on the spinal cord. Massive  enlargement of  the left thyroid lobe is partially visualized.  Mediastinal and lung findings are better demonstrated on recent  chest CT.  IMPRESSION:  1. T3 vertebral body mass with pathologic compression fracture and  epidural tumor resulting in moderate cord compression with evidence  of mild spinal cord edema.  2. T7-8 disc extrusion resulting in mild spinal stenosis.   MRI Head / C-Spine 9/23:  CLINICAL DATA: 48 year old female who presented with back pain and  lower extremity numbness, with continued inadequately explained  lower extremity weakness following Hospital discharge on 12/12/2013.  Initial encounter.  EXAM:  MRI HEAD WITHOUT AND WITH CONTRAST  MRI CERVICAL SPINE WITHOUT AND WITH  CONTRAST  TECHNIQUE:  Multiplanar, multiecho pulse sequences of the brain and surrounding  structures, and cervical spine, to include the craniocervical  junction and cervicothoracic junction, were obtained without and  with intravenous contrast.  CONTRAST: 20 mL MultiHance.  COMPARISON: Lumbar MRI 12/10/2013.  FINDINGS:  MRI HEAD FINDINGS  Cerebral volume is normal. No restricted diffusion to suggest acute  infarction. No midline shift, mass effect, evidence of mass lesion,  ventriculomegaly, extra-axial collection or acute intracranial  hemorrhage. Cervicomedullary junction and pituitary are within  normal limits. Major intracranial vascular flow voids are preserved.  Pearline Cables and white matter signal is within normal limits for age  throughout the brain. No abnormal enhancement identified, pulsation  artifact prominent on axial post-contrast images.  Large body habitus. Small right frontal scalp lipoma (series 10,  image 37). Visible internal auditory structures appear normal. Mild  right mastoid fluid. Minor paranasal sinus mucosal thickening.  Visualized orbit soft tissues are within normal limits. Partially  visible thyromegaly greater on the left, see below. Normal bone  marrow signal.  MRI  CERVICAL SPINE FINDINGS  Large body habitus resulting in wrap artifact on axial images.  However, there is massive thyroid enlargement evident, with the left  lobe measuring at least 7.2 x 5.2 x 11.2 cm (AP by transverse by  CC). The trachea is displaced to the right at the thoracic inlet and  mildly compressed. There is other regional mass effect including  sheath. On the left carotid  Fairly preserved cervical lordosis. No marrow edema or evidence of  acute osseous abnormality. Cervicomedullary junction is within  normal limits. Spinal cord signal is within normal limits at all  visualized levels. No abnormal intradural enhancement.  C2-C3: Negative.  C3-C4: Disc osteophyte complex, facet and ligament flavum  hypertrophy. Along with a degree of congenital canal narrowing,  there is mild spinal stenosis and moderate biforaminal stenosis.  C4-C5: Mild uncovertebral and facet hypertrophy. Mild right C5  foraminal stenosis.  C5-C6: Circumferential disc osteophyte complex. Along with a degree  of congenital canal narrowing there is mild spinal stenosis.  Moderate right C6 foraminal stenosis.  C6-C7: Negative except for mild facet hypertrophy.  C7-T1: Negative.  T1-T2: Negative.  Faintly visualized at the T3 level in the upper thoracic spine there  is a lobulated solidly enhancing mass which appears to extend into  the epidural space from the T3 vertebral body. This is inseparable  from the spinal cord which is likely compressed. See series 2000  image 7 and series 1500, image 6. This is not included on axial  images.  IMPRESSION:  1. Faintly visible but aggressive appearing enhancing mass at the T3  level of the upper thoracic spine, likely with spinal cord  compression evaluate further are with thoracic MRI without and with  contrast.  2. Normal for age MRI appearance of the brain.  3. Massive thyroid enlargement, greater on the left. Left thyroid  goiter/mass measuring at least 7.2  x 5.2 x 11.2 cm. Mass effect at  the thoracic inlet including on the trachea.  4. Mild chronic disc and endplate degeneration in the cervical spine  at C3-C4 and C5-C6 resulting in borderline to mild spinal stenosis,  but no cervical spinal cord mass effect or signal abnormality.  Electronically Signed:  By: Lars Pinks M.D.  On: 12/14/2013 09:50   MRI L-Spine 9/19:  CLINICAL DATA: Back pain with bilateral leg numbness, worsening  with oral steroids.  EXAM:  MRI LUMBAR SPINE WITHOUT CONTRAST  TECHNIQUE:  Multiplanar, multisequence MR imaging of the lumbar spine was  performed. No intravenous contrast was administered.  COMPARISON: CT 12/09/2013  FINDINGS:  There is no degenerative disease at L2-3 or above. The discs are  normal in signal characteristics and morphology. The spinal canal  and foramina are widely patent. The distal cord and conus are normal  with conus tip at L1. There are small benign and insignificant  hemangiomas within the L2 and L3 vertebral bodies.  L3-4: Minimal desiccation of the disc. Mild circumferential bulging  slightly more prominent towards the left. Bilateral facet  degeneration and hypertrophy. Mild narrowing of the lateral recesses  without apparent neural compression. Mild foraminal narrowing on the  left without apparent neural compression.  L4-5: Desiccation of the disc with circumferential protrusion.  Bilateral facet and ligamentous hypertrophy. Stenosis of both  lateral recesses without definite focal neural compression. Mild  foraminal narrowing bilaterally.  L5-S1: Desiccation of the disc with loss of height. Endplate  osteophytes and bulging of the disc more prominent towards the left.  Bilateral facet degeneration and hypertrophy left more than right.  Stenosis of the subarticular lateral recess on the left that could  compress the S1 nerve root. Mild foraminal narrowing left more than  right but without apparent compression of the exiting  L5 nerve  roots.  Serpiginous fluid-filled structure in the left pelvis could  represent hydrosalpinx. This is not primarily or completely imaged.  IMPRESSION:  Degenerative disc disease and degenerative facet disease in the  lower lumbar spine that could be a cause of lumbago.  Lateral recess and foraminal narrowing at L3-4, L4-5 and L5-S1 as  described above that could be a cause of focal neural compression.  In general, this is more pronounced on the left than on the right.  In particular, there is potential for left S1 nerve root compression  in the subarticular lateral recess at L5-S1.  Electronically Signed  By: Nelson Chimes M.D.  On: 12/10/2013 08:45   CT L-Spine 9/18:  CLINICAL DATA: Low back pain with difficulty walking.  EXAM:  CT LUMBAR SPINE WITHOUT CONTRAST  TECHNIQUE:  Multidetector CT imaging of the lumbar spine was performed without  intravenous contrast administration. Multiplanar CT image  reconstructions were also generated.  COMPARISON: None.  FINDINGS:  There is no fracture or spondylolisthesis. There is moderately  severe disc space narrowing at L5-S1 with slight vacuum phenomenon  at this level. There is moderate narrowing at L4-5. There are  prominent anterior osteophytes at L2, L3, L4, and L5.  At T12-L1, there is moderate facet hypertrophy bilaterally. There is  no nerve root edema or effacement. No disc extrusion or stenosis.  At L1-2, there is moderate facet hypertrophy bilaterally. There is  mild diffuse disc bulging. There is no nerve root edema or  effacement. No disc extrusion or stenosis.  At L2-3, there is broad-based disc bulging and moderate facet  hypertrophy bilaterally. There is borderline narrowing of the thecal  sac with borderline spinal stenosis. No disc extrusion.  At L3-4, there is severe facet osteoarthritic change bilaterally,  more pronounced on the left than on the right. There is broad-based  disc bulging and moderate ligamentum  flavum hypertrophy. These  findings in concert lead to moderate generalized spinal stenosis.  At L4-5, there is broad-based disc protrusion and severe facet  osteoarthritic change bilaterally. These findings in concert lead to  moderate generalized spinal stenosis. No frank disc extrusion.  At L5-S1, there is severe facet osteoarthritic change on the  left  and moderate facet osteoarthritic change on the right. There is  broad-based disc protrusion. There is exit foraminal narrowing  bilaterally, more severe on the left than on the right, due to  osteophytic change. No frank disc extrusion or stenosis is seen at  this level.  There are no paraspinous lesions.  IMPRESSION:  Spinal stenosis at L3-4 and L4-5, multifactorial. Borderline spinal  stenosis at L2-3. There does appear to be a degree of congenital  narrowing of the canal which contributes to spinal stenosis.  Multilevel arthropathy.  No frank disc extrusion.  No fracture or spondylolisthesis.  Electronically Signed  By: Lowella Grip M.D.  On: 12/09/2013 13:50   CXR 9/13:  CLINICAL DATA: Upper back pain for 5 weeks. Dyspnea on exertion.  Nonsmoker. History of hypertension.  EXAM:  CHEST 2 VIEW  COMPARISON: 04/15/2006  FINDINGS:  The heart is mildly enlarged. There is elevation of right  hemidiaphragm, stable in appearance. There is streaky density  favored to be within the right lower lobe, consistent with  atelectasis or infiltrate. No pulmonary edema. No pleural effusions.  There are moderate degenerative changes in the thoracic spine.  IMPRESSION:  Right lower lobe atelectasis or infiltrate.  Electronically Signed  By: Shon Hale M.D.  On: 12/04/2013 19:26  Venous Duplex 9/24:  Summary:  - No evidence of deep vein thrombosis involving the right lower extremity. - No evidence of deep vein thrombosis involving the left lower extremity.   Surgical Pathology Report: 12/21/2013 Diagnosis Soft tissue mass,  simple excision, Thoracic Epidural Tumor - METASTATIC CARCINOMA. PLEASE SEE COMMENT. Microscopic Comment There are clusters of malignant cells with abundant eosinophilic cytoplasm. A battery of immunostains were performed and the tumor cells show the following profile: Cytokeratin 7 - strongly positive Cytokeratin 8/18 - strongly positive TTF-1 - strongly positive. Thyroglobulin - negative CD10 - negative Cytokeratin 20 - negative S100 - negative Vimentin - negative The control stained appropriately. The overall findings are diagnostic for metastatic carcinoma. 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 However, a metastatic oncocytic thyroid carcinoma is still in the differential. Clinical and radiographic correlation is highly recommended. (HCL:kh 12-19-13) Aldona Bar MD Pathologist, Electronic Signature (Case signed 12/20/2013)  Elberta Leatherwood, MD 12/25/2013, 10:09 AM PGY-2, [place] [facility] pager: [phone], text pages welcome

## 2013-12-25 NOTE — Progress Notes (Signed)
ANTICOAGULATION CONSULT NOTE - Follow Up Consult  Pharmacy Consult:  Heparin Indication: pulmonary embolus  Allergies  Allergen Reactions  . Tramadol Nausea And Vomiting    Patient Measurements: Height: 5\' 11"  (180.3 cm) Weight: 313 lb 15 oz (142.4 kg) IBW/kg (Calculated) : 70.8 Heparin Dosing Weight: 104 kg  Vital Signs: Temp: 98.7 F (37.1 C) (10/04 0927) Temp Source: Oral (10/04 0927) BP: 127/92 mmHg (10/04 0927) Pulse Rate: 97 (10/04 0927)  Labs:  Recent Labs  12/23/13 0456 12/23/13 1344 12/24/13 0426 12/25/13 0410  HGB 13.2  --  12.7 11.8*  HCT 38.9  --  37.8 35.2*  PLT 264  --  266 291  HEPARINUNFRC 0.42  --  0.43 0.56  CREATININE  --  0.71  --   --     Estimated Creatinine Clearance: 136.4 ml/min (by C-G formula based on Cr of 0.71).   Assessment: 48 YO F admitted 12/12/13 with lower extremity weakness and found to have mass at T-3 level with spinal compression. She had surgery on 12/15/13 for decompression and biopsy.  Pt also found to have a PE on CT chest and started on IV heparin 48 hours post-operatively.  Lower extremity Venous Duplex 12/15/13 negative for DVT.  Hypercoagulable state likely from cancer - awaiting biopsy results for primary source.  Heparin level today = 0.56 on 1600 units/hr.  S/p thyroid biopsy 12/23/13.  CBC trending down however, no bleeding reported.  Goal of Therapy:  Heparin level 0.3-0.7 units/ml Monitor platelets by anticoagulation protocol: Yes    Plan:  1. Continue heparin at 1600 units/hr 2. Daily heparin level and CBC 3. Follow up plans for long term anticoagulation.  Long-term Lovenox is preferred based on the oncology guidelines.  May want to discuss this possibility with Dr. Jana Hakim and the patient.  May need to check insurance coverage/copay for long-term Lovenox.  Dose would be 150mg  SQ q12h.    Anette Guarneri, PharmD [phone]  12/25/2013 12:34 PM

## 2013-12-25 NOTE — Progress Notes (Signed)
Seen and examined.  Agree with the documentation and management of Dr. Alease Frame.

## 2013-12-25 NOTE — Progress Notes (Signed)
Patient ID: Sarah [name], female   DOB: 07-16-1965, 48 y.o.   MRN: [id number] No change in exam. For surgery this week

## 2013-12-26 ENCOUNTER — Telehealth: Payer: Self-pay | Admitting: *Deleted

## 2013-12-26 LAB — GLUCOSE, CAPILLARY
GLUCOSE-CAPILLARY: 294 mg/dL — AB (ref 70–99)
Glucose-Capillary: 299 mg/dL — ABNORMAL HIGH (ref 70–99)
Glucose-Capillary: 400 mg/dL — ABNORMAL HIGH (ref 70–99)
Glucose-Capillary: 272 mg/dL — ABNORMAL HIGH (ref 70–99)
Glucose-Capillary: 231 mg/dL — ABNORMAL HIGH (ref 70–99)

## 2013-12-26 LAB — CBC
HEMATOCRIT: 35 % — AB (ref 36.0–46.0)
Hemoglobin: 12.1 g/dL (ref 12.0–15.0)
MCH: 28.8 pg (ref 26.0–34.0)
MCHC: 34.6 g/dL (ref 30.0–36.0)
MCV: 83.3 fL (ref 78.0–100.0)
Platelets: 315 10*3/uL (ref 150–400)
RBC: 4.2 MIL/uL (ref 3.87–5.11)
RDW: 14.3 % (ref 11.5–15.5)
WBC: 11.1 10*3/uL — ABNORMAL HIGH (ref 4.0–10.5)

## 2013-12-26 LAB — HEPARIN LEVEL (UNFRACTIONATED): HEPARIN UNFRACTIONATED: 0.66 [IU]/mL (ref 0.30–0.70)

## 2013-12-26 MED ORDER — INSULIN ASPART 100 UNIT/ML ~~LOC~~ SOLN
0.0000 [IU] | Freq: Three times a day (TID) | SUBCUTANEOUS | Status: DC
  Administered 2013-12-26: 20 [IU] via SUBCUTANEOUS
  Administered 2013-12-26 – 2013-12-27 (×2): 11 [IU] via SUBCUTANEOUS
  Administered 2013-12-27 (×2): 7 [IU] via SUBCUTANEOUS
  Administered 2013-12-28: 15 [IU] via SUBCUTANEOUS
  Administered 2013-12-28: 11 [IU] via SUBCUTANEOUS
  Administered 2013-12-28: 7 [IU] via SUBCUTANEOUS
  Administered 2013-12-29: 11 [IU] via SUBCUTANEOUS
  Administered 2013-12-29 – 2013-12-31 (×2): 7 [IU] via SUBCUTANEOUS
  Administered 2013-12-31: 4 [IU] via SUBCUTANEOUS
  Administered 2013-12-31: 7 [IU] via SUBCUTANEOUS
  Administered 2014-01-01: 4 [IU] via SUBCUTANEOUS
  Administered 2014-01-01: 07:00:00 via SUBCUTANEOUS
  Administered 2014-01-01 – 2014-01-02 (×2): 7 [IU] via SUBCUTANEOUS
  Administered 2014-01-02: 4 [IU] via SUBCUTANEOUS
  Administered 2014-01-02: 13:00:00 via SUBCUTANEOUS
  Administered 2014-01-03 (×2): 4 [IU] via SUBCUTANEOUS
  Administered 2014-01-03: 7 [IU] via SUBCUTANEOUS
  Administered 2014-01-04: 4 [IU] via SUBCUTANEOUS
  Administered 2014-01-04: 7 [IU] via SUBCUTANEOUS
  Administered 2014-01-04 – 2014-01-05 (×2): 4 [IU] via SUBCUTANEOUS
  Administered 2014-01-05: 7 [IU] via SUBCUTANEOUS
  Administered 2014-01-05 – 2014-01-07 (×6): 4 [IU] via SUBCUTANEOUS
  Administered 2014-01-08: 7 [IU] via SUBCUTANEOUS
  Administered 2014-01-08: 4 [IU] via SUBCUTANEOUS
  Administered 2014-01-08 – 2014-01-09 (×3): 3 [IU] via SUBCUTANEOUS

## 2013-12-26 MED ORDER — INSULIN GLARGINE 100 UNIT/ML ~~LOC~~ SOLN
15.0000 [IU] | Freq: Every day | SUBCUTANEOUS | Status: DC
Start: 2013-12-27 — End: 2014-01-08
  Administered 2013-12-27 – 2014-01-08 (×13): 15 [IU] via SUBCUTANEOUS
  Filled 2013-12-26 (×13): qty 0.15

## 2013-12-26 MED ORDER — DEXAMETHASONE 2 MG PO TABS
2.0000 mg | ORAL_TABLET | Freq: Two times a day (BID) | ORAL | Status: DC
  Administered 2013-12-26 – 2013-12-27 (×2): 2 mg via ORAL
  Filled 2013-12-26 (×2): qty 1

## 2013-12-26 MED ORDER — CIPROFLOXACIN HCL 250 MG PO TABS: 250.0000 mg | ORAL_TABLET | Freq: Two times a day (BID) | ORAL | Status: DC

## 2013-12-26 MED ORDER — CIPROFLOXACIN HCL 250 MG PO TABS
250.0000 mg | ORAL_TABLET | Freq: Two times a day (BID) | ORAL | Status: DC
  Administered 2013-12-26 – 2013-12-29 (×7): 250 mg via ORAL
  Filled 2013-12-26 (×7): qty 1

## 2013-12-26 NOTE — Progress Notes (Signed)
Patient ID: Sarah [name], female   DOB: 1966/02/21, 48 y.o.   MRN: [id number] BP 131/71  Pulse 89  Temp(Src) 98.3 F (36.8 C) (Oral)  Resp 18  Ht 5\' 11"  (1.803 m)  Wt 142.4 kg (313 lb 15 oz)  BMI 43.80 kg/m2  SpO2 100%  LMP 11/28/2013 Plan on surgery later this week. Awaiting needle biopsy No neurologic changes.

## 2013-12-26 NOTE — Progress Notes (Signed)
UR completed 

## 2013-12-26 NOTE — Progress Notes (Signed)
Physical Therapy Treatment Patient Details Name: Sarah [name] MRN: [id number] DOB: 1965-12-03 Today's Date: 12/26/2013    History of Present Illness 48 y.o. female admitted to [facility] on 12/12/13 with progressive lower extremity weakness and mid to upper back pain.  Pt was found to have a mass at T3 with (+) spinal cord compression.  On 9/23 pt underwent surgery for decompression and biopsy.  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  Her thyroid is very enlarged.  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   Biopsy on enlarged thyroid preformed 12/23/13.  Pt with significant PMHx of HTN and chronic back pain.    PT Comments    Pt is progressing well with tolerance of ROM. No family present for me to review HEP handout.  Frequency decreased until after stabilization surgery (?this week).  PT will continue to follow acutely for family education, pressure relief reinforcement.   Follow Up Recommendations  CIR     Equipment Recommendations  Wheelchair (measurements PT);Wheelchair cushion (measurements PT);Hospital bed;Other (comment);3in1 (PT) (slide board, hoyer lift, drop arm 3-in-1)    Recommendations for Other Services   NA     Precautions / Restrictions Precautions Precautions: Fall Precaution Comments: bil leg weakness, parapelegia       Balance   Cognition Arousal/Alertness: Awake/alert Behavior During Therapy: WFL for tasks assessed/performed Overall Cognitive Status: Within Functional Limits for tasks assessed                      Exercises Other Exercises Other Exercises: PROM preformed to bil legs ankle DF, knee/hip flexion with external rotation, hip abduction x 10 reps each 5-10 second holds each.   Other Exercises: HEP handout provided.  No family present to review.    General Comments General comments (skin integrity, edema, etc.): Pt with significantly less tone in bil legs today compared to last session, ankle ROM is staying good, so no need at this time for  resting foot splints.       Pertinent Vitals/Pain Pain Assessment: 0-10 Pain Score: 2  Pain Location: mid to upper back Pain Descriptors / Indicators: Aching;Burning Pain Intervention(s): Monitored during session;Limited activity within patient's tolerance;Repositioned           PT Goals (current goals can now be found in the care plan section) Acute Rehab PT Goals Patient Stated Goal: to get her legs better, figure out a solid dx so that she can get treatment.  Progress towards PT goals: Progressing toward goals    Frequency  Min 2X/week (until post op and likely OOB orders)    PT Plan Frequency needs to be updated       End of Session   Activity Tolerance: Patient tolerated treatment well Patient left: in bed;with call bell/phone within reach     Time: 1306-1320 PT Time Calculation (min): 14 min  Charges:  $Therapeutic Exercise: 8-22 mins                     Amaar Oshita B. [place], [place], DPT [phone]   12/26/2013, 1:55 PM

## 2013-12-26 NOTE — Progress Notes (Signed)
Family Medicine Teaching Service Daily Progress Note Intern Pager: [phone]  Patient name: Sarah [name] Medical record number: [id number] Date of birth: 12/18/1965 Age: 48 y.o. Gender: female  Primary Care Provider: No PCP Per Patient Consultants: Neurosurgery, IR, Oncology Code Status: Full  Pt Overview and Major Events to Date:  - 9/22: Pt. Admitted with lower extremity weakness.  - 9/23: Pt. Found to have mass at T-3 level with spinal compression.  - 9/24: Surgery performed for decompression and biopsy overnight. Pt. Post-op and doing well. 3cm Suprahilar mass on CT Chest, PE also identified. ?Thyroid etiology, but much less likely for mets. Oncology consulted.  - 9/25 - 9/29: Awaiting pathology results with plan for oncology to see once path returns. Pt. With little neurological improvement. Started with light PT.  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 Oncology on board. Neurosurgery to comment on need for spinal stabilization surgery.  - 10/1: Radiology to perform thyroid US; Heterogeneous throughout, cannot r/o malignancy - 10/2: Thyroid Bx; results pending - 10/3: Decadron 4mg BID - 10/5: Decadron 2mg BID  Assessment and Plan: Sarah [name] is a 48 y.o. female presenting with continued lower extremity weakness at baseline after discharge from hospital on 9/21. PMH is significant for obesity, HTN, and ?History of Hypothyroidism previously on Synthroid per patient.   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  Neurosurgery took her to the OR on 9/23, and biopsied as well as performed laminectomy for decompression. 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 Overall this indicates a likely very poor prognosis. 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  - PT recommendations SNF with 24hour supervision. Will plan dispo based on goals of care discussion.   - Per SW; SNF bed currently available, waiting for med stability; pt currently would rather go home than to SNF  - Discussed Recs for PT with Neurosurgery. Light PT at this point. Pt. Not to get UOB.  - Pain control:  Oxy IR prn - Foley catheter in place for urinary retention; Monitor for fecal retention - pt. Continues to have BM on her own.   - Neurosurgery following: Dr. Cabbell states will take to OR as early as this week.   - Tapering Decadron:  10/3  4mg BID (began on 9/24 at 6mg Q6hr); will continue to taper - Oncology following: Goals of care to be discussed with possible outpatient treatment.   # Thyroid Nodule - Large thyroid goiter noted. TSH normal; ultrasound (10/1) showing diffuse heterogenicity, cannot r/o malignancy, Biopsy on 10/2 - FNA results pending; hopeful for results today.  #Pulmonary Embolus - Found on CT Chest  Pt. With brief episodes of tachycardia, but otherwise asymptomatic with O2 sats 100% on 2L [place].  Blood pressures stable. Lower extremity Venous Duplex negative for DVT.  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 ESR elevated.  - Now on IV heparin -- Will begin bridging as soon as possible (post surgical)  - Per pharm--recommendation for long-term Lovenox; I doubt this will be possible, patient is self-pay. If it is possible, Dose would be 150mg SQ q12h - Will continue to monitor VS. Stable at this time. - Continue heparin for now given the potential for surgery.   #Hyperglycemia -  Likely diabetic with PCOS given   A1C of 6.7. CBG's 200's - 300.  - Start on Sensitive SSI for glucose control in the hospital.  - Patient informed of elevated A1C and likely diagnosis of DMII. She agrees with [place] that this is a marginal issue at this point, and we will recommend treatment based upon our goals of care  discussion.  - Poor control with CBG's still 200-300.   - Continue to monitor.  - Sensitive SSI (up from moderate); 15U Lantus. (up from 10U) - Consider backing off on insulin dose as decadron is tapered down.  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 PCOS vs. Adrenal patholog. Would be unlike to be first presentation of HPA pathology; no evidence of salt wasting on labs. Hypertensive and not hypotensive. Testing during this hospitalization after having recently received steroids would be largely non-diagnostic. Though, could also be a combination of isolated processes i.e. Clitoromegaly, Hirsutism 2/2 obesity. - Recent thought of outpt vs inpt w/up including [place] to assess ovaries vs 21-hydroxylase/ACTH/cortisol.  - TSH, Cortisol WNL - A1C : 6.7 - consistent with PCOS given evidence of cystic structures in pelvis.   # UTI, 2/2 foley catheter - UCx = Proteus  - dysuria and suprapubic pain noted PM 10/1. - Cipro 273m BID (10/2 started) -- will treat for a total of 7 days  #HTN- BP fairly well controlled -continue losartan.  - Blood pressure well controlled here.     #Obesity-  - consistent with potential PCOS.   FEN/GI: Reg diet; KVO; s/p Enema with some relief Prophylaxis: on full dose IV heparin  Disposition: Pending Goals of Care discussion. Neurosurgery to address need for surgical spinal stabilization.   Subjective:  Pt. Continues to have some back pain which is controlled w/ oxycodone. Received new mattress and states she is much more comfortable. No new issues today. BM today. Will continue to monitor. Still waiting on thyroid bx results.  Objective: Temp:  [97.8 F (36.6 C)-98.1 F (36.7 C)] 98.1 F (36.7 C) (10/05 0542) Pulse Rate:  [85-93] 85 (10/05 0542) Resp:  [16-18] 16 (10/05 0542) BP: (138-147)/(68-84) 138/68 mmHg (10/05 0542) SpO2:  [98 %-99 %] 98 % (10/05 0542) Physical Exam: General:  NAD, AAOx3, morbidly obese  HEENT: Thyromegaly / Goiter noted on exam without tenderness, and no nodules, no LAD, neck supple; significant adipose tissue at base of posterior neck ("hump") Abdomen: Soft and nontender, mildly distended, No organomegaly noted.  Extremities: no edema,  No calf tenderness, 2+ distal pulses bilaterally.    Skin: No lesions or rashes noted, incision over T-spine C/D/I Neuro: cranial nerves grossly normal. Normal speech; 0/5  Plantarflexion BL feet. 0/5 Hip Flexor / extensor movement. Full; sensation to light touch, possibly L>R. + babinski bilaterally, and hyperreflexivity noted. Neuro exam is at new baseline. No new changes. No sensory deficits, and poor motor exam.   Laboratory:  Recent Labs Lab 12/24/13 0426 12/25/13 0410 12/26/13 0650  WBC 13.8* 12.6* 11.1*  HGB 12.7 11.8* 12.1  HCT 37.8 35.2* 35.0*  PLT 266 291 315    Recent Labs Lab 12/20/13 0720 12/21/13 0601 12/23/13 1344  NA 133* 131* 132*  K 4.9 4.6 5.1  CL 91* 93* 93*  CO2 _0 BUN 26* 24* 31*  CREATININE 0.77 0.70 0.71  CALCIUM 9.2 9.0 9.0  GLUCOSE 245* 263* 214*   Urinalysis    Component Value Date/Time   COLORURINE AMBER* 12/23/2013 [place]  TURBID* 12/23/2013 1058   LABSPEC 1.018 12/23/2013 1058   PHURINE 7.5 12/23/2013 1058   GLUCOSEU NEGATIVE 12/23/2013 1058   HGBUR LARGE* 12/23/2013 1058   BILIRUBINUR NEGATIVE 12/23/2013 1058   KETONESUR NEGATIVE 12/23/2013 1058   PROTEINUR 100* 12/23/2013 1058   UROBILINOGEN 0.2 12/23/2013 1058   NITRITE NEGATIVE 12/23/2013 1058   LEUKOCYTESUR LARGE* 12/23/2013 1058    Phos - 3.9 Mg - 2.2 CRP - 11.6 ESR - 59 CK - 56 B12 - 209 Random Cortisol - 25.4 A1C - 6.7 TSH - 2.13 RPR - nonreactive HIV - nonreactive UPT - Negative  Imaging/Diagnostic Tests:  CT Chest Abd/ Pelvis 9/23:  CLINICAL DATA: Destructive metastatic lesion of the T3 vertebral  body with no known primary carcinoma.  EXAM:  CT CHEST, ABDOMEN, AND PELVIS  WITH CONTRAST  TECHNIQUE:  Multidetector CT imaging of the chest, abdomen and pelvis was  performed following the standard protocol during bolus  administration of intravenous contrast.  CONTRAST: 100mL OMNIPAQUE IOHEXOL 300 MG/ML SOLN  COMPARISON: MRI of the cervical spine earlier today.  FINDINGS:  CT CHEST FINDINGS  Within the chest, a right suprahilar mass measures approximately 3.0  x 2.7 x 2.2 cm. This either represents a nodal metastasis or central  lung mass such as small cell carcinoma. A number of smaller  mediastinal lymph nodes are also identified in the right  paratracheal region, prevascular space and AP window.  Destructive lesion of the T3 vertebral body noted which nearly  replaces the entire vertebral body with tumor extension posteriorly  into the epidural space causing significant compression of the  spinal cord. Paravertebral extension of tumor is also suspected  lateral and anterior to the vertebral body, right greater than left.  Massive enlargement of the thyroid gland present with transverse  dimensions of approximately 7.7 x 8.5 cm. The thyroid gland is  incompletely visualized as the neck was not included. Central  dystrophic calcification identified. The enlarged thyroid gland does  cause significant mass effect on the airway with evidence of  tracheal deviation to the right and tracheal narrowing at the  thoracic inlet. The enlarged thyroid gland extends into the superior  mediastinum.  There is evidence of pulmonary embolism with nonocclusive thrombus  identified in the right middle lobe and lower lobe pulmonary  arteries. Pulmonary arterial opacification is not optimal on the  standard venous phase evaluation of the chest but the right sided  pulmonary embolism is clearly present.  There are small bilateral pulmonary nodules. Right-sided pulmonary  nodule measuring 6 mm likely localizes to the very top of the lower  lobe near the major fissure. Left  upper lobe nodule measures 7 mm.  Lingular nodule measures 7 mm. Left lower lobe nodule measures 7 mm.  Faint peripheral right middle lobe nodule measures 3 mm. Inferior  right middle lobe nodule measures 6 mm. Several small nodules in the  posterior right upper lobe identified in the 3-4 mm range.  No evidence of pleural or pericardial fluid. No other bony lesions  identified in the thorax.  CT ABDOMEN AND PELVIS FINDINGS  Small low-density lesion in the left lobe of the liver measures 7 mm  and likely represents a cyst. No other hepatic abnormalities. The  gallbladder, pancreas, spleen, adrenal glands and kidneys are  unremarkable. No adenopathy is identified in the abdomen or pelvis.  Bowel shows normal caliber without evidence of obstruction, lesion  or inflammation. No abnormal fluid collections.  The bladder is moderately distended. Elongated cystic   changes in  both adnexal regions identified which may represent a combination of  ovarian cysts and potentially hydrosalpinx. Correlation with pelvic  ultrasound is recommended. No hernias are identified. The uterus  appears unremarkable by CT. No bony lesions are identified in the  abdomen or pelvis.  IMPRESSION:  1. 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  2. 3 cm right suprahilar mass may represent nodal metastasis versus  primary central tumor. Next number multiple small bilateral  pulmonary nodules consistent with metastatic disease.  3. Acute nonocclusive pulmonary embolism in the right middle lobe  and right lower lobe pulmonary arteries.  4. Massive thyroid goiter causing tracheal deviation to the right  and narrowing of the trachea.  5. Tubular cystic abnormalities of both adnexal regions which may be  a combination of cystic changes in the ovaries and potentially  hydrosalpinx. Correlation  recommended with pelvic ultrasound to  exclude any suspicious cystic lesions of the adnexae.  6. Critical Value/emergent results were called by telephone at the  time of interpretation on 12/14/2013 at 4:52 pm to Dr. Smith of the  Family Medicine Teaching Service, who verbally acknowledged these  results.  Electronically Signed  By: Glenn Yamagata M.D.  On: 12/14/2013 16:53   MRI T-spine 9/23:  CLINICAL DATA: Lower extremity weakness. Worsening back pain.  Compressive upper thoracic mass partially visualized on cervical  spine MRI as well as chest CT with spinal cord compression.  EXAM:  MRI THORACIC SPINE WITHOUT CONTRAST  TECHNIQUE:  Multiplanar, multisequence MR imaging of the thoracic spine was  performed. No intravenous contrast was administered.  COMPARISON: Cervical spine MRI and chest CT earlier today  FINDINGS:  There is a T3 compression fracture with approximately 50% height  loss centrally. As described on cervical spine MRI and chest CT  earlier today, there is abnormal soft tissue replacing the majority  of the T3 vertebral body extending into the pedicles as well as into  the ventral epidural space with moderate cord compression. Epidural  extension is slightly greater on the right and extends into the  inferior aspect of the right T2-3 neural foramen as well as superior  aspect of the right T3-4 neural foramen. There is also abnormal  paravertebral soft tissue laterally and anteriorly as seen on CT.  Central ventral epidural material posterior to the T4 vertebral body  may represent tumor or hematoma. Mild T2 hyperintensity is present  in the spinal cord at the T3 level extending inferiorly to the T4-5  disc space level consistent with mild edema. Mild edema is present  in the posterior paraspinal soft tissues of the upper thoracic  spine. No other vertebral body lesions are identified.  There is a moderate-sized central disc extrusion at T7-8 which  contacts and  mildly flattens the ventral spinal cord with mild  spinal stenosis. A small left central disc protrusion is present at  T8-9 with at most minimal impression on the spinal cord. Massive  enlargement of the left thyroid lobe is partially visualized.  Mediastinal and lung findings are better demonstrated on recent  chest CT.  IMPRESSION:  1. T3 vertebral body mass with pathologic compression fracture and  epidural tumor resulting in moderate cord compression with evidence  of mild spinal cord edema.  2. T7-8 disc extrusion resulting in mild spinal stenosis.   MRI Head / C-Spine 9/23:  CLINICAL DATA: 48-year-old female who   presented with back pain and  lower extremity numbness, with continued inadequately explained  lower extremity weakness following Hospital discharge on 12/12/2013.  Initial encounter.  EXAM:  MRI HEAD WITHOUT AND WITH CONTRAST  MRI CERVICAL SPINE WITHOUT AND WITH CONTRAST  TECHNIQUE:  Multiplanar, multiecho pulse sequences of the brain and surrounding  structures, and cervical spine, to include the craniocervical  junction and cervicothoracic junction, were obtained without and  with intravenous contrast.  CONTRAST: 20 mL MultiHance.  COMPARISON: Lumbar MRI 12/10/2013.  FINDINGS:  MRI HEAD FINDINGS  Cerebral volume is normal. No restricted diffusion to suggest acute  infarction. No midline shift, mass effect, evidence of mass lesion,  ventriculomegaly, extra-axial collection or acute intracranial  hemorrhage. Cervicomedullary junction and pituitary are within  normal limits. Major intracranial vascular flow voids are preserved.  Pearline Cables and white matter signal is within normal limits for age  throughout the brain. No abnormal enhancement identified, pulsation  artifact prominent on axial post-contrast images.  Large body habitus. Small right frontal scalp lipoma (series 10,  image 37). Visible internal auditory structures appear normal. Mild  right mastoid fluid.  Minor paranasal sinus mucosal thickening.  Visualized orbit soft tissues are within normal limits. Partially  visible thyromegaly greater on the left, see below. Normal bone  marrow signal.  MRI CERVICAL SPINE FINDINGS  Large body habitus resulting in wrap artifact on axial images.  However, there is massive thyroid enlargement evident, with the left  lobe measuring at least 7.2 x 5.2 x 11.2 cm (AP by transverse by  CC). The trachea is displaced to the right at the thoracic inlet and  mildly compressed. There is other regional mass effect including  sheath. On the left carotid  Fairly preserved cervical lordosis. No marrow edema or evidence of  acute osseous abnormality. Cervicomedullary junction is within  normal limits. Spinal cord signal is within normal limits at all  visualized levels. No abnormal intradural enhancement.  C2-C3: Negative.  C3-C4: Disc osteophyte complex, facet and ligament flavum  hypertrophy. Along with a degree of congenital canal narrowing,  there is mild spinal stenosis and moderate biforaminal stenosis.  C4-C5: Mild uncovertebral and facet hypertrophy. Mild right C5  foraminal stenosis.  C5-C6: Circumferential disc osteophyte complex. Along with a degree  of congenital canal narrowing there is mild spinal stenosis.  Moderate right C6 foraminal stenosis.  C6-C7: Negative except for mild facet hypertrophy.  C7-T1: Negative.  T1-T2: Negative.  Faintly visualized at the T3 level in the upper thoracic spine there  is a lobulated solidly enhancing mass which appears to extend into  the epidural space from the T3 vertebral body. This is inseparable  from the spinal cord which is likely compressed. See series 2000  image 7 and series 1500, image 6. This is not included on axial  images.  IMPRESSION:  1. Faintly visible but aggressive appearing enhancing mass at the T3  level of the upper thoracic spine, likely with spinal cord  compression evaluate further are  with thoracic MRI without and with  contrast.  2. Normal for age MRI appearance of the brain.  3. Massive thyroid enlargement, greater on the left. Left thyroid  goiter/mass measuring at least 7.2 x 5.2 x 11.2 cm. Mass effect at  the thoracic inlet including on the trachea.  4. Mild chronic disc and endplate degeneration in the cervical spine  at C3-C4 and C5-C6 resulting in borderline to mild spinal stenosis,  but no cervical spinal cord mass effect or signal abnormality.  Electronically Signed:  By: Lars Pinks M.D.  On: 12/14/2013 09:50   MRI L-Spine 9/19:  CLINICAL DATA: Back pain with bilateral leg numbness, worsening  with oral steroids.  EXAM:  MRI LUMBAR SPINE WITHOUT CONTRAST  TECHNIQUE:  Multiplanar, multisequence MR imaging of the lumbar spine was  performed. No intravenous contrast was administered.  COMPARISON: CT 12/09/2013  FINDINGS:  There is no degenerative disease at L2-3 or above. The discs are  normal in signal characteristics and morphology. The spinal canal  and foramina are widely patent. The distal cord and conus are normal  with conus tip at L1. There are small benign and insignificant  hemangiomas within the L2 and L3 vertebral bodies.  L3-4: Minimal desiccation of the disc. Mild circumferential bulging  slightly more prominent towards the left. Bilateral facet  degeneration and hypertrophy. Mild narrowing of the lateral recesses  without apparent neural compression. Mild foraminal narrowing on the  left without apparent neural compression.  L4-5: Desiccation of the disc with circumferential protrusion.  Bilateral facet and ligamentous hypertrophy. Stenosis of both  lateral recesses without definite focal neural compression. Mild  foraminal narrowing bilaterally.  L5-S1: Desiccation of the disc with loss of height. Endplate  osteophytes and bulging of the disc more prominent towards the left.  Bilateral facet degeneration and hypertrophy left more than  right.  Stenosis of the subarticular lateral recess on the left that could  compress the S1 nerve root. Mild foraminal narrowing left more than  right but without apparent compression of the exiting L5 nerve  roots.  Serpiginous fluid-filled structure in the left pelvis could  represent hydrosalpinx. This is not primarily or completely imaged.  IMPRESSION:  Degenerative disc disease and degenerative facet disease in the  lower lumbar spine that could be a cause of lumbago.  Lateral recess and foraminal narrowing at L3-4, L4-5 and L5-S1 as  described above that could be a cause of focal neural compression.  In general, this is more pronounced on the left than on the right.  In particular, there is potential for left S1 nerve root compression  in the subarticular lateral recess at L5-S1.  Electronically Signed  By: Nelson Chimes M.D.  On: 12/10/2013 08:45   CT L-Spine 9/18:  CLINICAL DATA: Low back pain with difficulty walking.  EXAM:  CT LUMBAR SPINE WITHOUT CONTRAST  TECHNIQUE:  Multidetector CT imaging of the lumbar spine was performed without  intravenous contrast administration. Multiplanar CT image  reconstructions were also generated.  COMPARISON: None.  FINDINGS:  There is no fracture or spondylolisthesis. There is moderately  severe disc space narrowing at L5-S1 with slight vacuum phenomenon  at this level. There is moderate narrowing at L4-5. There are  prominent anterior osteophytes at L2, L3, L4, and L5.  At T12-L1, there is moderate facet hypertrophy bilaterally. There is  no nerve root edema or effacement. No disc extrusion or stenosis.  At L1-2, there is moderate facet hypertrophy bilaterally. There is  mild diffuse disc bulging. There is no nerve root edema or  effacement. No disc extrusion or stenosis.  At L2-3, there is broad-based disc bulging and moderate facet  hypertrophy bilaterally. There is borderline narrowing of the thecal  sac with borderline spinal  stenosis. No disc extrusion.  At L3-4, there is severe facet osteoarthritic change bilaterally,  more pronounced on the left than on the right. There is broad-based  disc bulging and moderate ligamentum flavum hypertrophy. These  findings in concert lead to moderate generalized spinal stenosis.  At L4-5, there is broad-based disc protrusion and severe facet  osteoarthritic change bilaterally. These findings in concert lead to  moderate generalized spinal stenosis. No frank disc extrusion.  At L5-S1, there is severe facet osteoarthritic change on the left  and moderate facet osteoarthritic change on the right. There is  broad-based disc protrusion. There is exit foraminal narrowing  bilaterally, more severe on the left than on the right, due to  osteophytic change. No frank disc extrusion or stenosis is seen at  this level.  There are no paraspinous lesions.  IMPRESSION:  Spinal stenosis at L3-4 and L4-5, multifactorial. Borderline spinal  stenosis at L2-3. There does appear to be a degree of congenital  narrowing of the canal which contributes to spinal stenosis.  Multilevel arthropathy.  No frank disc extrusion.  No fracture or spondylolisthesis.  Electronically Signed  By: William Woodruff M.D.  On: 12/09/2013 13:50   CXR 9/13:  CLINICAL DATA: Upper back pain for 5 weeks. Dyspnea on exertion.  Nonsmoker. History of hypertension.  EXAM:  CHEST 2 VIEW  COMPARISON: 04/15/2006  FINDINGS:  The heart is mildly enlarged. There is elevation of right  hemidiaphragm, stable in appearance. There is streaky density  favored to be within the right lower lobe, consistent with  atelectasis or infiltrate. No pulmonary edema. No pleural effusions.  There are moderate degenerative changes in the thoracic spine.  IMPRESSION:  Right lower lobe atelectasis or infiltrate.  Electronically Signed  By: Beth Brown M.D.  On: 12/04/2013 19:26  Venous Duplex 9/24:  Summary:  - No evidence of  deep vein thrombosis involving the right lower extremity. - No evidence of deep vein thrombosis involving the left lower extremity.   Surgical Pathology Report: 12/21/2013 Diagnosis Soft tissue mass, simple excision, Thoracic Epidural Tumor - METASTATIC CARCINOMA. PLEASE SEE COMMENT. Microscopic Comment There are clusters of malignant cells with abundant eosinophilic cytoplasm. A battery of immunostains were performed and the tumor cells show the following profile: Cytokeratin 7 - strongly positive Cytokeratin 8/18 - strongly positive TTF-1 - strongly positive. Thyroglobulin - negative CD10 - negative Cytokeratin 20 - negative S100 - negative Vimentin - negative The control stained appropriately. The overall findings are diagnostic for metastatic carcinoma. 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 However, a metastatic oncocytic thyroid carcinoma is still in the differential. Clinical and radiographic correlation is highly recommended. (HCL:kh 12-19-13) H. CATHERINE LI MD Pathologist, Electronic Signature (Case signed 12/20/2013)  Ian D McKeag, MD 12/26/2013, 9:41 AM PGY-2,  Family Medicine FPTS Intern pager: [phone], text pages welcome  

## 2013-12-26 NOTE — Progress Notes (Signed)
Inpatient Diabetes Program Recommendations  AACE/ADA: New Consensus Statement on Inpatient Glycemic Control (2013)  Target Ranges:  Prepandial:   less than 140 mg/dL      Peak postprandial:   less than 180 mg/dL (1-2 hours)      Critically ill patients:  140 - 180 mg/dL    Inpatient Diabetes Program Recommendations Correction (SSI): Increase correction to resistant or add meal coveraage of  3-4 units tidwc Insulin - Meal Coverage: Please add 3-4 units meal coverage while on decadron 4 mg tid  Thank you, Rosita Kea, RN, CNS, Diabetes Coordinator [phone])

## 2013-12-26 NOTE — Progress Notes (Signed)
Family Medicine Teaching Service Attending Note  I discussed patient Fifer  with Dr. Alease Frame and reviewed their note for today.  I agree with their assessment and plan.

## 2013-12-26 NOTE — Progress Notes (Signed)
ANTICOAGULATION CONSULT NOTE - Follow Up Consult  Pharmacy Consult:  Heparin Indication: pulmonary embolus  Allergies  Allergen Reactions  . Tramadol Nausea And Vomiting    Patient Measurements: Height: 5\' 11"  (180.3 cm) Weight: 313 lb 15 oz (142.4 kg) IBW/kg (Calculated) : 70.8 Heparin Dosing Weight: 104 kg  Vital Signs: Temp: 98.1 F (36.7 C) (10/05 0542) Temp Source: Oral (10/05 0542) BP: 138/68 mmHg (10/05 0542) Pulse Rate: 85 (10/05 0542)  Labs:  Recent Labs  12/23/13 1344  12/24/13 0426 12/25/13 0410 12/26/13 0650  HGB  --   < > 12.7 11.8* 12.1  HCT  --   --  37.8 35.2* 35.0*  PLT  --   --  266 291 315  HEPARINUNFRC  --   --  0.43 0.56 0.66  CREATININE 0.71  --   --   --   --   < > = values in this interval not displayed.  Estimated Creatinine Clearance: 136.4 ml/min (by C-G formula based on Cr of 0.71).   Assessment: This AM the heparin level is 0.66 on IV heparin rate 1600 units/hr for acute PE in this 48 yo female admitted 12/12/13.  She was admitted with lower extremity weakness and found to have mass at T-3 level with spinal compression. She had surgery on 12/15/13 for decompression and biopsy.  Pt also found to have a PE on CT chest and started on IV heparin 48 hours post-operatively.  LE venous duplex 12/15/13 negative for DVT.  Hypercoagulable state likely from cancer. 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 No bleeding reported.  Goal of Therapy:  Heparin level 0.3-0.7 units/ml Monitor platelets by anticoagulation protocol: Yes    Plan:  1. Continue heparin at 1600 units/hr 2. Daily heparin level and CBC 3. Follow up plans for long term anticoagulation.  Long-term Lovenox is preferred based on the oncology guidelines.  May want to discuss this possibility with Dr. Jana Hakim and the patient.  May need to check insurance coverage/copay for long-term Lovenox.  Dose  would be 150mg  SQ q12h.    Nicole Cella, RPh Clinical Pharmacist Pager: [phone] 12/26/2013 9:33 AM

## 2013-12-26 NOTE — Telephone Encounter (Signed)
Called pt today.  She answered.  I introduced myself as the thoracic navigator at the cancer center.  I listened as she explained everything going on with her.  She states she is having another surgery on her back soon.  She is not leaving the hospital at this time.  I stated I will set up an appt with Dr. Julien Nordmann once she is discharged. I will update Dr. Julien Nordmann.

## 2013-12-26 NOTE — Progress Notes (Signed)
Chaplain initiated visit with pt. Pt sleeping; chaplain will attempt later.   12/26/13 1500  Clinical Encounter Type  Visited With Patient not available  Visit Type Initial  Kylah Maresh, Barbette Hair, Chaplain 3:07 PM 12/26/2013

## 2013-12-27 LAB — BASIC METABOLIC PANEL
ANION GAP: 13 (ref 5–15)
BUN: 25 mg/dL — ABNORMAL HIGH (ref 6–23)
CALCIUM: 9 mg/dL (ref 8.4–10.5)
CO2: 26 mEq/L (ref 19–32)
CREATININE: 0.77 mg/dL (ref 0.50–1.10)
Chloride: 91 mEq/L — ABNORMAL LOW (ref 96–112)
GFR calc Af Amer: >90 mL/min (ref >90)
Glucose, Bld: 239 mg/dL — ABNORMAL HIGH (ref 70–99)
Potassium: 4.8 mEq/L (ref 3.7–5.3)
Sodium: 130 mEq/L — ABNORMAL LOW (ref 137–147)

## 2013-12-27 LAB — CBC
HCT: 36 % (ref 36.0–46.0)
Hemoglobin: 12.1 g/dL (ref 12.0–15.0)
MCH: 28.2 pg (ref 26.0–34.0)
MCHC: 33.6 g/dL (ref 30.0–36.0)
MCV: 83.9 fL (ref 78.0–100.0)
PLATELETS: 303 10*3/uL (ref 150–400)
RBC: 4.29 MIL/uL (ref 3.87–5.11)
RDW: 14.4 % (ref 11.5–15.5)
WBC: 11.7 10*3/uL — ABNORMAL HIGH (ref 4.0–10.5)

## 2013-12-27 LAB — GLUCOSE, CAPILLARY
Glucose-Capillary: 201 mg/dL — ABNORMAL HIGH (ref 70–99)
Glucose-Capillary: 268 mg/dL — ABNORMAL HIGH (ref 70–99)
Glucose-Capillary: 233 mg/dL — ABNORMAL HIGH (ref 70–99)

## 2013-12-27 LAB — HEPARIN LEVEL (UNFRACTIONATED): Heparin Unfractionated: 0.38 IU/mL (ref 0.30–0.70)

## 2013-12-27 MED ORDER — DEXAMETHASONE 2 MG PO TABS
1.0000 mg | ORAL_TABLET | Freq: Two times a day (BID) | ORAL | Status: DC
  Administered 2013-12-27: 1 mg via ORAL
  Filled 2013-12-27: qty 1

## 2013-12-27 NOTE — Progress Notes (Signed)
ANTICOAGULATION CONSULT NOTE - Follow Up Consult  Pharmacy Consult:  Heparin Indication: pulmonary embolus  Allergies  Allergen Reactions  . Tramadol Nausea And Vomiting    Patient Measurements: Height: 5\' 11"  (180.3 cm) Weight: 313 lb 15 oz (142.4 kg) IBW/kg (Calculated) : 70.8 Heparin Dosing Weight: 104 kg  Vital Signs: Temp: 97.5 F (36.4 C) (10/06 1003) Temp Source: Oral (10/06 1003) BP: 123/80 mmHg (10/06 1003) Pulse Rate: 84 (10/06 1003)  Labs:  Recent Labs  12/25/13 0410 12/26/13 0650 12/27/13 0701  HGB 11.8* 12.1 12.1  HCT 35.2* 35.0* 36.0  PLT 291 315 303  HEPARINUNFRC 0.56 0.66 0.38  CREATININE  --   --  0.77    Estimated Creatinine Clearance: 136.4 ml/min (by C-G formula based on Cr of 0.77).   Assessment: Today the heparin level is 0.38 on IV heparin rate 1600 units/hr for acute PE in this 48 yo obese female. She was admitted on 9/21 with LE weakness and found to have mass at T-3 level with spinal compression. S/p surgery on 12/15/13 for decompression and biopsy.  Found to have a PE on CT chest and started on IV heparin 48 hours post-op.  LE venous duplex 12/15/13 negative for DVT.  Hypercoagulable state likely from cancer. 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 No bleeding reported. MD notes plan to  begin bridging as soon as possible (post surgical) but to continue heparin for now given the potential for surgery.  Baseline INR is 1.03 on 12/22/13  Goal of Therapy:  Heparin level 0.3-0.7 units/ml Monitor platelets by anticoagulation protocol: Yes    Plan:  1. Continue heparin at 1600 units/hr 2. Daily heparin level and CBC 3. Follow up plans for long term anticoagulation.  Long-term Lovenox is preferred based on the oncology guidelines.  May want to discuss this possibility with Dr. Jana Hakim and the patient.  May need to check insurance coverage/copay for long-term  Lovenox.  Dose would be 150mg  SQ q12h.  (or 140mg  q12h)  Wt 142kg  Nicole Cella, RPh Clinical Pharmacist Pager: [phone] 12/27/2013 12:45 PM

## 2013-12-27 NOTE — Progress Notes (Signed)
Family Medicine Teaching Service Daily Progress Note Intern Pager: [phone]  Patient name: Sarah [name] Medical record number: [id number] Date of birth: [birth year] Age: 48 y.o. Gender: female  Primary Care Provider: No PCP Per Patient Consultants: Neurosurgery, IR, Oncology Code Status: Full  Pt Overview and Major Events to Date:  - 9/22: Pt. Admitted with lower extremity weakness.  - 9/23: Pt. Found to have mass at T-3 level with spinal compression.  - 9/24: Surgery performed for decompression and biopsy overnight. Pt. Post-op and doing well. 3cm Suprahilar mass on CT Chest, PE also identified. ?Thyroid etiology, but much less likely for mets. Oncology consulted.  - 9/25 - 9/29: Awaiting pathology results with plan for oncology to see once path returns. Pt. With little neurological improvement. Started with light PT.  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 Oncology on board. Neurosurgery to comment on need for spinal stabilization surgery.  - 10/1: Radiology to perform thyroid US; Heterogeneous throughout, cannot r/o malignancy - 10/2: Thyroid Bx; results pending - 10/3: Decadron 53m BID - 10/5: Decadron 21045mBID - 10/6: Decadron 45m78mID  Assessment and Plan: VicMANJU [name] a 48 22o. female presenting with continued lower extremity weakness at baseline after discharge from hospital on 9/21. PMH is significant for obesity, HTN, and ?History of Hypothyroidism previously on Synthroid per patient.   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  Neurosurgery took her to the OR on 9/23, and biopsied as well as performed laminectomy for decompression. 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 Overall this indicates a likely very poor prognosis. Metastatic  adenocarcinoma confirmed by pathology with lung as the most likely source at this point.  - PT recommendations SNF with 24hour supervision. Will plan dispo based on goals of care discussion.   - Per SW; SNF bed currently available, waiting for med stability; pt currently would rather go  home than to SNF  - Discussed Recs for PT with Neurosurgery. Light PT at this point. Pt. Not to get UOB.  - Pain control:  Oxy IR prn - Foley catheter in place for urinary retention; Monitor for fecal retention - pt. Continues to have BM on her own.   - Neurosurgery following: Dr. CabChristella Noaates will take to OR as early as this week.   - Tapering Decadron:  10/3  4mg60mD (began on 9/24 at 6mg 40mr); will continue to taper    - 45mg B345mtoday>>45mg QD48m.5mg QD>62m - Oncology following: Goals of care to be discussed with possible outpatient treatment.   # Thyroid Nodule - Large thyroid goiter noted. TSH normal; ultrasound (10/1) showing diffuse heterogenicity, cannot r/o malignancy, Biopsy on 10/2 - FNA results pending; hopeful for results  soon  #Pulmonary Embolus - Found on CT Chest  Pt. With brief episodes of tachycardia, but otherwise asymptomatic with O2 sats 100% on 2L [place].  Blood pressures stable. Lower extremity Venous Duplex negative for DVT.  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 ESR elevated.  - Now on IV heparin -- Will begin bridging as soon as possible (post surgical)  - Per pharm--recommendation for long-term Lovenox; I doubt this will be possible, patient is self-pay. If it is possible, Dose would be 150mg SQ 24m; will plan to discuss anticoagulation w/ Onc (Dr. Mohamed) Julien Nordmann continue to  monitor VS. Stable at this time. - Continue heparin for now given the potential for surgery.   #Hyperglycemia -  Likely diabetic with PCOS given A1C of 6.7. CBG's 200's - 300.  - Patient informed of elevated A1C and likely diagnosis of DMII. She agrees with [place] that this is a marginal issue at this  point, and we will recommend treatment based upon our goals of care discussion.  - Continue to monitor.  - Sensitive SSI; 15U Lantus. (consider back down to Lantus 10U tomorrow w/ weaning decadron) - Consider backing off on insulin dose as decadron is tapered down.  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 PCOS vs. Adrenal patholog. Would be unlike to be first presentation of HPA pathology; no evidence of salt wasting on labs. Hypertensive and not hypotensive. Testing during this hospitalization after having recently received steroids would be largely non-diagnostic. Though, could also be a combination of isolated processes i.e. Clitoromegaly, Hirsutism 2/2 obesity. - Recent thought of outpt vs inpt w/up including [place] to assess ovaries vs 21-hydroxylase/ACTH/cortisol.  - TSH, Cortisol WNL - A1C : 6.7 - consistent with PCOS given evidence of cystic structures in pelvis.   # UTI, 2/2 foley catheter - UCx = Proteus  - dysuria and suprapubic pain noted PM 10/1. - Cipro 238m BID (10/2 started) -- will treat for a total of 7 days  #HTN- BP fairly well controlled -continue losartan.  - Blood pressure well controlled here.     #Obesity-  - consistent with potential PCOS.   FEN/GI: Reg diet; KVO; s/p Enema with some relief Prophylaxis: on full dose IV heparin  Disposition: Pending Goals of Care discussion. Neurosurgery to address need for surgical spinal stabilization.   Subjective:  Pt. Continues to have some back pain which is controlled w/ oxycodone. States that she really only needs her pain meds for bed, otherwise shes very comfortable. She seems to be keeping her spirits high. No new issues today. Will continue to monitor. Still waiting on thyroid bx results.  Objective: Temp:  [97.7 F (36.5 C)-99.1 F (37.3 C)] 97.7 F (36.5 C) (10/06 0552) Pulse Rate:  [68-89] 68 (10/06 0552) Resp:  [18-20] 20 (10/06  0552) BP: (131-142)/(71-88) 134/86 mmHg (10/06 0552) SpO2:  [98 %-100 %] 98 % (10/06 0552) Physical Exam: General: NAD, AAOx3, morbidly obese  HEENT: Thyromegaly / Goiter noted on exam without tenderness, and no nodules, no LAD, neck supple; significant adipose tissue at base of posterior neck ("hump") Abdomen: Soft and nontender, mildly distended, No organomegaly noted.  Extremities: no edema,  No calf tenderness, 2+ distal pulses bilaterally.    Skin: No lesions or rashes noted, incision over T-spine C/D/I Neuro: cranial nerves grossly normal. Normal speech; 0/5  Plantarflexion BL feet. 0/5 Hip Flexor / extensor movement. Full; sensation to light touch, possibly L>R. + babinski bilaterally, and hyperreflexivity noted. Neuro exam is at new baseline. No new changes. No sensory deficits, and poor motor exam.   Laboratory:  Recent Labs Lab 12/24/13 0426 12/25/13 0410 12/26/13 0650  WBC 13.8* 12.6* 11.1*  HGB 12.7 11.8* 12.1  HCT 37.8 35.2* 35.0*  PLT 266 291 315    Recent Labs Lab 12/21/13 0601 12/23/13 1344  NA 131* 132*  K 4.6 5.1  CL 93* 93*  CO2 24 26  BUN 24* 31*  CREATININE 0.70 0.71  CALCIUM 9.0 9.0  GLUCOSE 263* 214*   Urinalysis    Component Value  Date/Time   COLORURINE AMBER* 12/23/2013 1058   APPEARANCEUR TURBID* 12/23/2013 1058   LABSPEC 1.018 12/23/2013 1058   PHURINE 7.5 12/23/2013 1058   GLUCOSEU NEGATIVE 12/23/2013 1058   HGBUR LARGE* 12/23/2013 1058   BILIRUBINUR NEGATIVE 12/23/2013 1058   KETONESUR NEGATIVE 12/23/2013 1058   PROTEINUR 100* 12/23/2013 1058   UROBILINOGEN 0.2 12/23/2013 1058   NITRITE NEGATIVE 12/23/2013 1058   LEUKOCYTESUR LARGE* 12/23/2013 1058    Phos - 3.9 Mg - 2.2 CRP - 11.6 ESR - 59 CK - 56 B12 - 209 Random Cortisol - 25.4 A1C - 6.7 TSH - 2.13 RPR - nonreactive HIV - nonreactive UPT - Negative  Imaging/Diagnostic Tests:  CT Chest Abd/ Pelvis 9/23:  CLINICAL DATA: Destructive metastatic lesion of the T3 vertebral  body  with no known primary carcinoma.  EXAM:  CT CHEST, ABDOMEN, AND PELVIS WITH CONTRAST  TECHNIQUE:  Multidetector CT imaging of the chest, abdomen and pelvis was  performed following the standard protocol during bolus  administration of intravenous contrast.  CONTRAST: 126m OMNIPAQUE IOHEXOL 300 MG/ML SOLN  COMPARISON: MRI of the cervical spine earlier today.  FINDINGS:  CT CHEST FINDINGS  Within the chest, a right suprahilar mass measures approximately 3.0  x 2.7 x 2.2 cm. This either represents a nodal metastasis or central  lung mass such as small cell carcinoma. A number of smaller  mediastinal lymph nodes are also identified in the right  paratracheal region, prevascular space and AP window.  Destructive lesion of the T3 vertebral body noted which nearly  replaces the entire vertebral body with tumor extension posteriorly  into the epidural space causing significant compression of the  spinal cord. Paravertebral extension of tumor is also suspected  lateral and anterior to the vertebral body, right greater than left.  Massive enlargement of the thyroid gland present with transverse  dimensions of approximately 7.7 x 8.5 cm. The thyroid gland is  incompletely visualized as the neck was not included. Central  dystrophic calcification identified. The enlarged thyroid gland does  cause significant mass effect on the airway with evidence of  tracheal deviation to the right and tracheal narrowing at the  thoracic inlet. The enlarged thyroid gland extends into the superior  mediastinum.  There is evidence of pulmonary embolism with nonocclusive thrombus  identified in the right middle lobe and lower lobe pulmonary  arteries. Pulmonary arterial opacification is not optimal on the  standard venous phase evaluation of the chest but the right sided  pulmonary embolism is clearly present.  There are small bilateral pulmonary nodules. Right-sided pulmonary  nodule measuring 6 mm likely  localizes to the very top of the lower  lobe near the major fissure. Left upper lobe nodule measures 7 mm.  Lingular nodule measures 7 mm. Left lower lobe nodule measures 7 mm.  Faint peripheral right middle lobe nodule measures 3 mm. Inferior  right middle lobe nodule measures 6 mm. Several small nodules in the  posterior right upper lobe identified in the 3-4 mm range.  No evidence of pleural or pericardial fluid. No other bony lesions  identified in the thorax.  CT ABDOMEN AND PELVIS FINDINGS  Small low-density lesion in the left lobe of the liver measures 7 mm  and likely represents a cyst. No other hepatic abnormalities. The  gallbladder, pancreas, spleen, adrenal glands and kidneys are  unremarkable. No adenopathy is identified in the abdomen or pelvis.  Bowel shows normal caliber without evidence of obstruction, lesion  or inflammation. No abnormal  fluid collections.  The bladder is moderately distended. Elongated cystic changes in  both adnexal regions identified which may represent a combination of  ovarian cysts and potentially hydrosalpinx. Correlation with pelvic  ultrasound is recommended. No hernias are identified. The uterus  appears unremarkable by CT. No bony lesions are identified in the  abdomen or pelvis.  IMPRESSION:  1. 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  2. 3 cm right suprahilar mass may represent nodal metastasis versus  primary central tumor. Next number multiple small bilateral  pulmonary nodules consistent with metastatic disease.  3. Acute nonocclusive pulmonary embolism in the right middle lobe  and right lower lobe pulmonary arteries.  4. Massive thyroid goiter causing tracheal deviation to the right  and narrowing of the trachea.  5. Tubular cystic abnormalities of both adnexal regions which may be  a combination of  cystic changes in the ovaries and potentially  hydrosalpinx. Correlation recommended with pelvic ultrasound to  exclude any suspicious cystic lesions of the adnexae.  6. Critical Value/emergent results were called by telephone at the  time of interpretation on 12/14/2013 at 4:52 pm to Dr. Tamala Julian of the  [facility] Medicine Teaching Service, who verbally acknowledged these  results.  Electronically Signed  By: Aletta Edouard M.D.  On: 12/14/2013 16:53   MRI T-spine 9/23:  CLINICAL DATA: Lower extremity weakness. Worsening back pain.  Compressive upper thoracic mass partially visualized on cervical  spine MRI as well as chest CT with spinal cord compression.  EXAM:  MRI THORACIC SPINE WITHOUT CONTRAST  TECHNIQUE:  Multiplanar, multisequence MR imaging of the thoracic spine was  performed. No intravenous contrast was administered.  COMPARISON: Cervical spine MRI and chest CT earlier today  FINDINGS:  There is a T3 compression fracture with approximately 50% height  loss centrally. As described on cervical spine MRI and chest CT  earlier today, there is abnormal soft tissue replacing the majority  of the T3 vertebral body extending into the pedicles as well as into  the ventral epidural space with moderate cord compression. Epidural  extension is slightly greater on the right and extends into the  inferior aspect of the right T2-3 neural foramen as well as superior  aspect of the right T3-4 neural foramen. There is also abnormal  paravertebral soft tissue laterally and anteriorly as seen on CT.  Central ventral epidural material posterior to the T4 vertebral body  may represent tumor or hematoma. Mild T2 hyperintensity is present  in the spinal cord at the T3 level extending inferiorly to the T4-5  disc space level consistent with mild edema. Mild edema is present  in the posterior paraspinal soft tissues of the upper thoracic  spine. No other vertebral body lesions are identified.  There  is a moderate-sized central disc extrusion at T7-8 which  contacts and mildly flattens the ventral spinal cord with mild  spinal stenosis. A small left central disc protrusion is present at  T8-9 with at most minimal impression on the spinal cord. Massive  enlargement of the left thyroid lobe is partially visualized.  Mediastinal and lung findings are better demonstrated on recent  chest CT.  IMPRESSION:  1. T3 vertebral body mass with pathologic compression fracture and  epidural tumor resulting in moderate cord compression with evidence  of mild spinal cord edema.  2. T7-8 disc extrusion resulting in mild spinal stenosis.   MRI  Head / C-Spine 9/23:  CLINICAL DATA: 48 year old female who presented with back pain and  lower extremity numbness, with continued inadequately explained  lower extremity weakness following Hospital discharge on 12/12/2013.  Initial encounter.  EXAM:  MRI HEAD WITHOUT AND WITH CONTRAST  MRI CERVICAL SPINE WITHOUT AND WITH CONTRAST  TECHNIQUE:  Multiplanar, multiecho pulse sequences of the brain and surrounding  structures, and cervical spine, to include the craniocervical  junction and cervicothoracic junction, were obtained without and  with intravenous contrast.  CONTRAST: 20 mL MultiHance.  COMPARISON: Lumbar MRI 12/10/2013.  FINDINGS:  MRI HEAD FINDINGS  Cerebral volume is normal. No restricted diffusion to suggest acute  infarction. No midline shift, mass effect, evidence of mass lesion,  ventriculomegaly, extra-axial collection or acute intracranial  hemorrhage. Cervicomedullary junction and pituitary are within  normal limits. Major intracranial vascular flow voids are preserved.  Pearline Cables and white matter signal is within normal limits for age  throughout the brain. No abnormal enhancement identified, pulsation  artifact prominent on axial post-contrast images.  Large body habitus. Small right frontal scalp lipoma (series 10,  image 37). Visible  internal auditory structures appear normal. Mild  right mastoid fluid. Minor paranasal sinus mucosal thickening.  Visualized orbit soft tissues are within normal limits. Partially  visible thyromegaly greater on the left, see below. Normal bone  marrow signal.  MRI CERVICAL SPINE FINDINGS  Large body habitus resulting in wrap artifact on axial images.  However, there is massive thyroid enlargement evident, with the left  lobe measuring at least 7.2 x 5.2 x 11.2 cm (AP by transverse by  CC). The trachea is displaced to the right at the thoracic inlet and  mildly compressed. There is other regional mass effect including  sheath. On the left carotid  Fairly preserved cervical lordosis. No marrow edema or evidence of  acute osseous abnormality. Cervicomedullary junction is within  normal limits. Spinal cord signal is within normal limits at all  visualized levels. No abnormal intradural enhancement.  C2-C3: Negative.  C3-C4: Disc osteophyte complex, facet and ligament flavum  hypertrophy. Along with a degree of congenital canal narrowing,  there is mild spinal stenosis and moderate biforaminal stenosis.  C4-C5: Mild uncovertebral and facet hypertrophy. Mild right C5  foraminal stenosis.  C5-C6: Circumferential disc osteophyte complex. Along with a degree  of congenital canal narrowing there is mild spinal stenosis.  Moderate right C6 foraminal stenosis.  C6-C7: Negative except for mild facet hypertrophy.  C7-T1: Negative.  T1-T2: Negative.  Faintly visualized at the T3 level in the upper thoracic spine there  is a lobulated solidly enhancing mass which appears to extend into  the epidural space from the T3 vertebral body. This is inseparable  from the spinal cord which is likely compressed. See series 2000  image 7 and series 1500, image 6. This is not included on axial  images.  IMPRESSION:  1. Faintly visible but aggressive appearing enhancing mass at the T3  level of the upper  thoracic spine, likely with spinal cord  compression evaluate further are with thoracic MRI without and with  contrast.  2. Normal for age MRI appearance of the brain.  3. Massive thyroid enlargement, greater on the left. Left thyroid  goiter/mass measuring at least 7.2 x 5.2 x 11.2 cm. Mass effect at  the thoracic inlet including on the trachea.  4. Mild chronic disc and endplate degeneration in the cervical spine  at C3-C4 and C5-C6 resulting in borderline to mild spinal stenosis,  but  no cervical spinal cord mass effect or signal abnormality.  Electronically Signed:  By: Lars Pinks M.D.  On: 12/14/2013 09:50   MRI L-Spine 9/19:  CLINICAL DATA: Back pain with bilateral leg numbness, worsening  with oral steroids.  EXAM:  MRI LUMBAR SPINE WITHOUT CONTRAST  TECHNIQUE:  Multiplanar, multisequence MR imaging of the lumbar spine was  performed. No intravenous contrast was administered.  COMPARISON: CT 12/09/2013  FINDINGS:  There is no degenerative disease at L2-3 or above. The discs are  normal in signal characteristics and morphology. The spinal canal  and foramina are widely patent. The distal cord and conus are normal  with conus tip at L1. There are small benign and insignificant  hemangiomas within the L2 and L3 vertebral bodies.  L3-4: Minimal desiccation of the disc. Mild circumferential bulging  slightly more prominent towards the left. Bilateral facet  degeneration and hypertrophy. Mild narrowing of the lateral recesses  without apparent neural compression. Mild foraminal narrowing on the  left without apparent neural compression.  L4-5: Desiccation of the disc with circumferential protrusion.  Bilateral facet and ligamentous hypertrophy. Stenosis of both  lateral recesses without definite focal neural compression. Mild  foraminal narrowing bilaterally.  L5-S1: Desiccation of the disc with loss of height. Endplate  osteophytes and bulging of the disc more prominent towards  the left.  Bilateral facet degeneration and hypertrophy left more than right.  Stenosis of the subarticular lateral recess on the left that could  compress the S1 nerve root. Mild foraminal narrowing left more than  right but without apparent compression of the exiting L5 nerve  roots.  Serpiginous fluid-filled structure in the left pelvis could  represent hydrosalpinx. This is not primarily or completely imaged.  IMPRESSION:  Degenerative disc disease and degenerative facet disease in the  lower lumbar spine that could be a cause of lumbago.  Lateral recess and foraminal narrowing at L3-4, L4-5 and L5-S1 as  described above that could be a cause of focal neural compression.  In general, this is more pronounced on the left than on the right.  In particular, there is potential for left S1 nerve root compression  in the subarticular lateral recess at L5-S1.  Electronically Signed  By: Nelson Chimes M.D.  On: 12/10/2013 08:45   CT L-Spine 9/18:  CLINICAL DATA: Low back pain with difficulty walking.  EXAM:  CT LUMBAR SPINE WITHOUT CONTRAST  TECHNIQUE:  Multidetector CT imaging of the lumbar spine was performed without  intravenous contrast administration. Multiplanar CT image  reconstructions were also generated.  COMPARISON: None.  FINDINGS:  There is no fracture or spondylolisthesis. There is moderately  severe disc space narrowing at L5-S1 with slight vacuum phenomenon  at this level. There is moderate narrowing at L4-5. There are  prominent anterior osteophytes at L2, L3, L4, and L5.  At T12-L1, there is moderate facet hypertrophy bilaterally. There is  no nerve root edema or effacement. No disc extrusion or stenosis.  At L1-2, there is moderate facet hypertrophy bilaterally. There is  mild diffuse disc bulging. There is no nerve root edema or  effacement. No disc extrusion or stenosis.  At L2-3, there is broad-based disc bulging and moderate facet  hypertrophy bilaterally.  There is borderline narrowing of the thecal  sac with borderline spinal stenosis. No disc extrusion.  At L3-4, there is severe facet osteoarthritic change bilaterally,  more pronounced on the left than on the right. There is broad-based  disc bulging and moderate ligamentum flavum hypertrophy. These  findings in concert lead to moderate generalized spinal stenosis.  At L4-5, there is broad-based disc protrusion and severe facet  osteoarthritic change bilaterally. These findings in concert lead to  moderate generalized spinal stenosis. No frank disc extrusion.  At L5-S1, there is severe facet osteoarthritic change on the left  and moderate facet osteoarthritic change on the right. There is  broad-based disc protrusion. There is exit foraminal narrowing  bilaterally, more severe on the left than on the right, due to  osteophytic change. No frank disc extrusion or stenosis is seen at  this level.  There are no paraspinous lesions.  IMPRESSION:  Spinal stenosis at L3-4 and L4-5, multifactorial. Borderline spinal  stenosis at L2-3. There does appear to be a degree of congenital  narrowing of the canal which contributes to spinal stenosis.  Multilevel arthropathy.  No frank disc extrusion.  No fracture or spondylolisthesis.  Electronically Signed  By: Lowella Grip M.D.  On: 12/09/2013 13:50   CXR 9/13:  CLINICAL DATA: Upper back pain for 5 weeks. Dyspnea on exertion.  Nonsmoker. History of hypertension.  EXAM:  CHEST 2 VIEW  COMPARISON: 04/15/2006  FINDINGS:  The heart is mildly enlarged. There is elevation of right  hemidiaphragm, stable in appearance. There is streaky density  favored to be within the right lower lobe, consistent with  atelectasis or infiltrate. No pulmonary edema. No pleural effusions.  There are moderate degenerative changes in the thoracic spine.  IMPRESSION:  Right lower lobe atelectasis or infiltrate.  Electronically Signed  By: Shon Hale M.D.  On:  12/04/2013 19:26  Venous Duplex 9/24:  Summary:  - No evidence of deep vein thrombosis involving the right lower extremity. - No evidence of deep vein thrombosis involving the left lower extremity.   Surgical Pathology Report: 12/21/2013 Diagnosis Soft tissue mass, simple excision, Thoracic Epidural Tumor - METASTATIC CARCINOMA. PLEASE SEE COMMENT. Microscopic Comment There are clusters of malignant cells with abundant eosinophilic cytoplasm. A battery of immunostains were performed and the tumor cells show the following profile: Cytokeratin 7 - strongly positive Cytokeratin 8/18 - strongly positive TTF-1 - strongly positive. Thyroglobulin - negative CD10 - negative Cytokeratin 20 - negative S100 - negative Vimentin - negative The control stained appropriately. The overall findings are diagnostic for metastatic carcinoma. 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 However, a metastatic oncocytic thyroid carcinoma is still in the differential. Clinical and radiographic correlation is highly recommended. (HCL:kh 12-19-13) Aldona Bar MD Pathologist, Electronic Signature (Case signed 12/20/2013)  Elberta Leatherwood, MD 12/27/2013, 7:34 AM PGY-2, [place] [facility] pager: [phone], text pages welcome

## 2013-12-27 NOTE — Progress Notes (Signed)
Patient ID: Sarah [name], female   DOB: 31-Oct-1965, 48 y.o.   MRN: [id number] BP 132/86  Pulse 83  Temp(Src) 98 F (36.7 C) (Oral)  Resp 20  Ht 5\' 11"  (1.803 m)  Wt 142.4 kg (313 lb 15 oz)  BMI 43.80 kg/m2  SpO2 98%  LMP 11/28/2013 Arranging for time in or. Will let patient know. Still await fna results.

## 2013-12-27 NOTE — Progress Notes (Signed)
Family Medicine Teaching Service Attending Note  I interviewed and examined patient Pepitone and reviewed their tests and x-rays.  I discussed with Dr. Alease Frame and reviewed their note for today.  I agree with their assessment and plan.     Additionally  Alert interactive Investigate possible places for care and transportation once spinal surgery is completed

## 2013-12-27 NOTE — Progress Notes (Signed)
Dear Doctor: Nori Riis:  This patient has been identified as a candidate for PICC for the following reason (s): IV therapy over 48 hours and poor veins/poor circulatory system (CHF, COPD, emphysema, diabetes, steroid use, IV drug abuse, etc.) If you agree, please write an order for the indicated device. For any questions contact the Vascular Access Team at [phone] if no answer, please leave a message.  Thank you for supporting the early vascular access assessment program.

## 2013-12-28 LAB — BASIC METABOLIC PANEL
Anion gap: 16 — ABNORMAL HIGH (ref 5–15)
BUN: 27 mg/dL — ABNORMAL HIGH (ref 6–23)
CO2: 26 mEq/L (ref 19–32)
Calcium: 8.8 mg/dL (ref 8.4–10.5)
Chloride: 90 mEq/L — ABNORMAL LOW (ref 96–112)
Creatinine, Ser: 0.82 mg/dL (ref 0.50–1.10)
GFR calc Af Amer: >90 mL/min (ref >90)
GFR calc non Af Amer: 84 mL/min — ABNORMAL LOW (ref >90)
GLUCOSE: 241 mg/dL — AB (ref 70–99)
Potassium: 4.5 mEq/L (ref 3.7–5.3)
Sodium: 132 mEq/L — ABNORMAL LOW (ref 137–147)

## 2013-12-28 LAB — CBC
HCT: 36.7 % (ref 36.0–46.0)
Hemoglobin: 12.3 g/dL (ref 12.0–15.0)
MCH: 28.5 pg (ref 26.0–34.0)
MCHC: 33.5 g/dL (ref 30.0–36.0)
MCV: 85 fL (ref 78.0–100.0)
PLATELETS: 305 10*3/uL (ref 150–400)
RBC: 4.32 MIL/uL (ref 3.87–5.11)
RDW: 14.7 % (ref 11.5–15.5)
WBC: 13 10*3/uL — ABNORMAL HIGH (ref 4.0–10.5)

## 2013-12-28 LAB — GLUCOSE, CAPILLARY
Glucose-Capillary: 241 mg/dL — ABNORMAL HIGH (ref 70–99)
Glucose-Capillary: 229 mg/dL — ABNORMAL HIGH (ref 70–99)
Glucose-Capillary: 257 mg/dL — ABNORMAL HIGH (ref 70–99)
Glucose-Capillary: 311 mg/dL — ABNORMAL HIGH (ref 70–99)
Glucose-Capillary: 256 mg/dL — ABNORMAL HIGH (ref 70–99)

## 2013-12-28 LAB — HEPARIN LEVEL (UNFRACTIONATED): HEPARIN UNFRACTIONATED: 0.67 [IU]/mL (ref 0.30–0.70)

## 2013-12-28 MED ORDER — DEXAMETHASONE 2 MG PO TABS
1.0000 mg | ORAL_TABLET | Freq: Every day | ORAL | Status: DC
  Administered 2013-12-28 – 2013-12-29 (×2): 1 mg via ORAL
  Filled 2013-12-28 (×2): qty 1

## 2013-12-28 NOTE — Progress Notes (Signed)
Family Medicine Teaching Service Attending Note  I discussed patient Sarah [name]  with Dr. Alease Frame and reviewed their note for today.  I agree with their assessment and plan.

## 2013-12-28 NOTE — Progress Notes (Signed)
Family Medicine Teaching Service Daily Progress Note Intern Pager: [phone]  Patient name: Sarah [name] Medical record number: [id number] Date of birth: 02/19/1966 Age: 48 y.o. Gender: female  Primary Care Provider: No PCP Per Patient Consultants: Neurosurgery, IR, Oncology Code Status: Full  Pt Overview and Major Events to Date:  - 9/22: Pt. Admitted with lower extremity weakness.  - 9/23: Pt. Found to have mass at T-3 level with spinal compression.  - 9/24: Surgery performed for decompression and biopsy overnight. Pt. Post-op and doing well. 3cm Suprahilar mass on CT Chest, PE also identified. ?Thyroid etiology, but much less likely for mets. Oncology consulted.  - 9/25 - 9/29: Awaiting pathology results with plan for oncology to see once path returns. Pt. With little neurological improvement. Started with light PT.  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 Oncology on board. Neurosurgery to comment on need for spinal stabilization surgery.  - 10/1: Radiology to perform thyroid US; Heterogeneous throughout, cannot r/o malignancy - 10/2: Thyroid Bx; results pending - 10/3-7: Decadron 237m BID >> 244mBID >> 37m39mID >> 37mg66m   Assessment and Plan: Sarah ZAHNISERa 48 y30. female presenting with continued lower extremity weakness at baseline after discharge from hospital on 9/21. PMH is significant for obesity, HTN, and ?History of Hypothyroidism previously on Synthroid per patient.   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  Neurosurgery took her to the OR on 9/23, and biopsied as well as performed laminectomy for decompression. 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 Overall this indicates a likely very poor prognosis. Metastatic adenocarcinoma  confirmed by pathology with lung as the most likely source at this point.  - PT recommendations SNF with 24hour supervision. Will plan dispo based on goals of care discussion.   - Per SW; SNF bed currently available, waiting for med stability; pt currently would rather go  home than to SNF  - Discussed Recs for PT with Neurosurgery. Light PT at this point. Pt. Not to get UOB.  - Pain control:  Oxy IR prn - Foley catheter in place for urinary retention; Monitor for fecal retention - pt. Continues to have BM on her own.   - Neurosurgery following: Dr. CabbChristella Noates will take to OR as early as this week.   - Tapering Decadron:  10/3  4mg 70m (began on 9/24 at 6mg Q237m); will continue to taper    - today 37mg QD437m.5mg QD>35m - Oncology following: Goals of care to be discussed with possible outpatient treatment.   # Thyroid Nodule - Large thyroid goiter noted. TSH normal; ultrasound (10/1) showing diffuse heterogenicity, cannot r/o malignancy, Biopsy on 10/2 - FNA results pending; hopeful for results  soon  #Pulmonary Embolus - Found on CT Chest  Pt. With brief episodes of tachycardia, but otherwise asymptomatic with O2 sats 100% on 2L [place].  Blood pressures stable. Lower extremity Venous Duplex negative for DVT.  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 ESR elevated.  - Now on IV heparin -- Will begin bridging as soon as possible (post surgical)  - Per pharm--recommendation for long-term Lovenox; I doubt this will be possible, patient is self-pay. If it is possible, Dose would be 150mg SQ 26m; will plan to discuss anticoagulation w/ Onc (Dr. Mohamed) Julien Nordmann continue to monitor  VS. Stable at this time. - Continue heparin for now given the potential for surgery.   #Hyperglycemia -  Likely diabetic with PCOS given A1C of 6.7. CBG's 200's - 300.  - Patient informed of elevated A1C and likely diagnosis of DMII. She agrees with [place] that this is a marginal issue at this point, and we will recommend  treatment based upon our goals of care discussion.  - Continue to monitor.  - Sensitive SSI; 15U Lantus. (consider back down to Lantus 10U tomorrow w/ weaning decadron) - Consider backing off on insulin dose as decadron is tapered down.  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 PCOS vs. Adrenal patholog. Would be unlike to be first presentation of HPA pathology; no evidence of salt wasting on labs. Hypertensive and not hypotensive. Testing during this hospitalization after having recently received steroids would be largely non-diagnostic. Though, could also be a combination of isolated processes i.e. Clitoromegaly, Hirsutism 2/2 obesity. - Recent thought of outpt vs inpt w/up including [place] to assess ovaries vs 21-hydroxylase/ACTH/cortisol.  - TSH, Cortisol WNL - A1C : 6.7 - consistent with PCOS given evidence of cystic structures in pelvis.   # UTI, 2/2 foley catheter - UCx = Proteus  - dysuria and suprapubic pain noted PM 10/1. - Cipro 21m BID (10/2 started) -- will treat for a total of 7 days  #HTN- BP fairly well controlled -continue losartan.  - Blood pressure well controlled here.     #Obesity-  - consistent with potential PCOS.   FEN/GI: Reg diet; KVO; s/p Enema with some relief Prophylaxis: on full dose IV heparin  Disposition: Pending Goals of Care discussion. Neurosurgery to address need for surgical spinal stabilization.   Subjective:  Pt. Continues to have some back pain which is controlled w/ oxycodone. States that she really only needs her pain meds for bed, otherwise shes very comfortable. She seems to be keeping her spirits high. No new issues today. BMs have been daily. Will continue to monitor. Still waiting on thyroid bx results.  Objective: Temp:  [97.9 F (36.6 C)-98.2 F (36.8 C)] 98.2 F (36.8 C) (10/07 1426) Pulse Rate:  [81-104] 104 (10/07 1426) Resp:  [18-20] 20 (10/07 1426) BP:  (118-141)/(69-87) 118/71 mmHg (10/07 1426) SpO2:  [98 %-100 %] 100 % (10/07 1426) Physical Exam: General: NAD, AAOx3, morbidly obese  HEENT: Thyromegaly / Goiter noted on exam without tenderness, and no nodules, no LAD, neck supple; significant adipose tissue at base of posterior neck ("hump") Abdomen: Soft and nontender, mildly distended, No organomegaly noted.  Extremities: no edema,  No calf tenderness, 2+ distal pulses bilaterally.    Skin: No lesions or rashes noted, incision over T-spine C/D/I Neuro: cranial nerves grossly normal. Normal speech; 0/5  Plantarflexion BL feet. 0/5 Hip Flexor / extensor movement. Full; sensation to light touch, possibly L>R. + babinski bilaterally, and hyperreflexivity noted. Neuro exam is at new baseline. No new changes. No sensory deficits, and poor motor exam.   Laboratory:  Recent Labs Lab 12/26/13 0650 12/27/13 0701 12/28/13 0917  WBC 11.1* 11.7* 13.0*  HGB 12.1 12.1 12.3  HCT 35.0* 36.0 36.7  PLT 315 303 305    Recent Labs Lab 12/23/13 1344 12/27/13 0701 12/28/13 0917  NA 132* 130* 132*  K 5.1 4.8 4.5  CL 93* 91* 90*  CO2 _0 BUN 31* 25* 27*  CREATININE 0.71 0.77 0.82  CALCIUM 9.0 9.0 8.8  GLUCOSE 214* 239* 241*   Urinalysis    Component Value Date/Time   COLORURINE AMBER* 12/23/2013 1058   APPEARANCEUR TURBID* 12/23/2013 1058   LABSPEC 1.018 12/23/2013 1058   PHURINE 7.5 12/23/2013 1058   GLUCOSEU NEGATIVE 12/23/2013 1058   HGBUR LARGE* 12/23/2013 1058   BILIRUBINUR NEGATIVE 12/23/2013 1058   KETONESUR NEGATIVE 12/23/2013 1058   PROTEINUR 100* 12/23/2013 1058   UROBILINOGEN 0.2 12/23/2013 1058   NITRITE NEGATIVE 12/23/2013 1058   LEUKOCYTESUR LARGE* 12/23/2013 1058    Phos - 3.9 Mg - 2.2 CRP - 11.6 ESR - 59 CK - 56 B12 - 209 Random Cortisol - 25.4 A1C - 6.7 TSH - 2.13 RPR - nonreactive HIV - nonreactive UPT - Negative  Imaging/Diagnostic Tests:  CT Chest Abd/ Pelvis 9/23:  CLINICAL DATA: Destructive metastatic  lesion of the T3 vertebral  body with no known primary carcinoma.  EXAM:  CT CHEST, ABDOMEN, AND PELVIS WITH CONTRAST  TECHNIQUE:  Multidetector CT imaging of the chest, abdomen and pelvis was  performed following the standard protocol during bolus  administration of intravenous contrast.  CONTRAST: 1103m OMNIPAQUE IOHEXOL 300 MG/ML SOLN  COMPARISON: MRI of the cervical spine earlier today.  FINDINGS:  CT CHEST FINDINGS  Within the chest, a right suprahilar mass measures approximately 3.0  x 2.7 x 2.2 cm. This either represents a nodal metastasis or central  lung mass such as small cell carcinoma. A number of smaller  mediastinal lymph nodes are also identified in the right  paratracheal region, prevascular space and AP window.  Destructive lesion of the T3 vertebral body noted which nearly  replaces the entire vertebral body with tumor extension posteriorly  into the epidural space causing significant compression of the  spinal cord. Paravertebral extension of tumor is also suspected  lateral and anterior to the vertebral body, right greater than left.  Massive enlargement of the thyroid gland present with transverse  dimensions of approximately 7.7 x 8.5 cm. The thyroid gland is  incompletely visualized as the neck was not included. Central  dystrophic calcification identified. The enlarged thyroid gland does  cause significant mass effect on the airway with evidence of  tracheal deviation to the right and tracheal narrowing at the  thoracic inlet. The enlarged thyroid gland extends into the superior  mediastinum.  There is evidence of pulmonary embolism with nonocclusive thrombus  identified in the right middle lobe and lower lobe pulmonary  arteries. Pulmonary arterial opacification is not optimal on the  standard venous phase evaluation of the chest but the right sided  pulmonary embolism is clearly present.  There are small bilateral pulmonary nodules. Right-sided pulmonary   nodule measuring 6 mm likely localizes to the very top of the lower  lobe near the major fissure. Left upper lobe nodule measures 7 mm.  Lingular nodule measures 7 mm. Left lower lobe nodule measures 7 mm.  Faint peripheral right middle lobe nodule measures 3 mm. Inferior  right middle lobe nodule measures 6 mm. Several small nodules in the  posterior right upper lobe identified in the 3-4 mm range.  No evidence of pleural or pericardial fluid. No other bony lesions  identified in the thorax.  CT ABDOMEN AND PELVIS FINDINGS  Small low-density lesion in the left lobe of the liver measures 7 mm  and likely represents a cyst. No other hepatic abnormalities. The  gallbladder, pancreas, spleen, adrenal glands and kidneys are  unremarkable. No adenopathy is identified in the abdomen or pelvis.  Bowel shows  normal caliber without evidence of obstruction, lesion  or inflammation. No abnormal fluid collections.  The bladder is moderately distended. Elongated cystic changes in  both adnexal regions identified which may represent a combination of  ovarian cysts and potentially hydrosalpinx. Correlation with pelvic  ultrasound is recommended. No hernias are identified. The uterus  appears unremarkable by CT. No bony lesions are identified in the  abdomen or pelvis.  IMPRESSION:  1. 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  2. 3 cm right suprahilar mass may represent nodal metastasis versus  primary central tumor. Next number multiple small bilateral  pulmonary nodules consistent with metastatic disease.  3. Acute nonocclusive pulmonary embolism in the right middle lobe  and right lower lobe pulmonary arteries.  4. Massive thyroid goiter causing tracheal deviation to the right  and narrowing of the trachea.  5. Tubular cystic abnormalities of both adnexal regions which  may be  a combination of cystic changes in the ovaries and potentially  hydrosalpinx. Correlation recommended with pelvic ultrasound to  exclude any suspicious cystic lesions of the adnexae.  6. Critical Value/emergent results were called by telephone at the  time of interpretation on 12/14/2013 at 4:52 pm to Dr. Tamala Julian of the  [facility] Medicine Teaching Service, who verbally acknowledged these  results.  Electronically Signed  By: Aletta Edouard M.D.  On: 12/14/2013 16:53   MRI T-spine 9/23:  CLINICAL DATA: Lower extremity weakness. Worsening back pain.  Compressive upper thoracic mass partially visualized on cervical  spine MRI as well as chest CT with spinal cord compression.  EXAM:  MRI THORACIC SPINE WITHOUT CONTRAST  TECHNIQUE:  Multiplanar, multisequence MR imaging of the thoracic spine was  performed. No intravenous contrast was administered.  COMPARISON: Cervical spine MRI and chest CT earlier today  FINDINGS:  There is a T3 compression fracture with approximately 50% height  loss centrally. As described on cervical spine MRI and chest CT  earlier today, there is abnormal soft tissue replacing the majority  of the T3 vertebral body extending into the pedicles as well as into  the ventral epidural space with moderate cord compression. Epidural  extension is slightly greater on the right and extends into the  inferior aspect of the right T2-3 neural foramen as well as superior  aspect of the right T3-4 neural foramen. There is also abnormal  paravertebral soft tissue laterally and anteriorly as seen on CT.  Central ventral epidural material posterior to the T4 vertebral body  may represent tumor or hematoma. Mild T2 hyperintensity is present  in the spinal cord at the T3 level extending inferiorly to the T4-5  disc space level consistent with mild edema. Mild edema is present  in the posterior paraspinal soft tissues of the upper thoracic  spine. No other vertebral body  lesions are identified.  There is a moderate-sized central disc extrusion at T7-8 which  contacts and mildly flattens the ventral spinal cord with mild  spinal stenosis. A small left central disc protrusion is present at  T8-9 with at most minimal impression on the spinal cord. Massive  enlargement of the left thyroid lobe is partially visualized.  Mediastinal and lung findings are better demonstrated on recent  chest CT.  IMPRESSION:  1. T3 vertebral body mass with pathologic compression fracture and  epidural tumor resulting in moderate cord compression with evidence  of mild spinal cord edema.  2. T7-8 disc extrusion resulting in mild spinal stenosis.   MRI Head / C-Spine 9/23:  CLINICAL DATA: 48 year old female who presented with back pain and  lower extremity numbness, with continued inadequately explained  lower extremity weakness following Hospital discharge on 12/12/2013.  Initial encounter.  EXAM:  MRI HEAD WITHOUT AND WITH CONTRAST  MRI CERVICAL SPINE WITHOUT AND WITH CONTRAST  TECHNIQUE:  Multiplanar, multiecho pulse sequences of the brain and surrounding  structures, and cervical spine, to include the craniocervical  junction and cervicothoracic junction, were obtained without and  with intravenous contrast.  CONTRAST: 20 mL MultiHance.  COMPARISON: Lumbar MRI 12/10/2013.  FINDINGS:  MRI HEAD FINDINGS  Cerebral volume is normal. No restricted diffusion to suggest acute  infarction. No midline shift, mass effect, evidence of mass lesion,  ventriculomegaly, extra-axial collection or acute intracranial  hemorrhage. Cervicomedullary junction and pituitary are within  normal limits. Major intracranial vascular flow voids are preserved.  Pearline Cables and white matter signal is within normal limits for age  throughout the brain. No abnormal enhancement identified, pulsation  artifact prominent on axial post-contrast images.  Large body habitus. Small right frontal scalp lipoma  (series 10,  image 37). Visible internal auditory structures appear normal. Mild  right mastoid fluid. Minor paranasal sinus mucosal thickening.  Visualized orbit soft tissues are within normal limits. Partially  visible thyromegaly greater on the left, see below. Normal bone  marrow signal.  MRI CERVICAL SPINE FINDINGS  Large body habitus resulting in wrap artifact on axial images.  However, there is massive thyroid enlargement evident, with the left  lobe measuring at least 7.2 x 5.2 x 11.2 cm (AP by transverse by  CC). The trachea is displaced to the right at the thoracic inlet and  mildly compressed. There is other regional mass effect including  sheath. On the left carotid  Fairly preserved cervical lordosis. No marrow edema or evidence of  acute osseous abnormality. Cervicomedullary junction is within  normal limits. Spinal cord signal is within normal limits at all  visualized levels. No abnormal intradural enhancement.  C2-C3: Negative.  C3-C4: Disc osteophyte complex, facet and ligament flavum  hypertrophy. Along with a degree of congenital canal narrowing,  there is mild spinal stenosis and moderate biforaminal stenosis.  C4-C5: Mild uncovertebral and facet hypertrophy. Mild right C5  foraminal stenosis.  C5-C6: Circumferential disc osteophyte complex. Along with a degree  of congenital canal narrowing there is mild spinal stenosis.  Moderate right C6 foraminal stenosis.  C6-C7: Negative except for mild facet hypertrophy.  C7-T1: Negative.  T1-T2: Negative.  Faintly visualized at the T3 level in the upper thoracic spine there  is a lobulated solidly enhancing mass which appears to extend into  the epidural space from the T3 vertebral body. This is inseparable  from the spinal cord which is likely compressed. See series 2000  image 7 and series 1500, image 6. This is not included on axial  images.  IMPRESSION:  1. Faintly visible but aggressive appearing enhancing mass at  the T3  level of the upper thoracic spine, likely with spinal cord  compression evaluate further are with thoracic MRI without and with  contrast.  2. Normal for age MRI appearance of the brain.  3. Massive thyroid enlargement, greater on the left. Left thyroid  goiter/mass measuring at least 7.2 x 5.2 x 11.2 cm. Mass effect at  the thoracic inlet including on the trachea.  4. Mild chronic disc and endplate degeneration in the cervical spine  at  C3-C4 and C5-C6 resulting in borderline to mild spinal stenosis,  but no cervical spinal cord mass effect or signal abnormality.  Electronically Signed:  By: Lars Pinks M.D.  On: 12/14/2013 09:50   MRI L-Spine 9/19:  CLINICAL DATA: Back pain with bilateral leg numbness, worsening  with oral steroids.  EXAM:  MRI LUMBAR SPINE WITHOUT CONTRAST  TECHNIQUE:  Multiplanar, multisequence MR imaging of the lumbar spine was  performed. No intravenous contrast was administered.  COMPARISON: CT 12/09/2013  FINDINGS:  There is no degenerative disease at L2-3 or above. The discs are  normal in signal characteristics and morphology. The spinal canal  and foramina are widely patent. The distal cord and conus are normal  with conus tip at L1. There are small benign and insignificant  hemangiomas within the L2 and L3 vertebral bodies.  L3-4: Minimal desiccation of the disc. Mild circumferential bulging  slightly more prominent towards the left. Bilateral facet  degeneration and hypertrophy. Mild narrowing of the lateral recesses  without apparent neural compression. Mild foraminal narrowing on the  left without apparent neural compression.  L4-5: Desiccation of the disc with circumferential protrusion.  Bilateral facet and ligamentous hypertrophy. Stenosis of both  lateral recesses without definite focal neural compression. Mild  foraminal narrowing bilaterally.  L5-S1: Desiccation of the disc with loss of height. Endplate  osteophytes and bulging of the  disc more prominent towards the left.  Bilateral facet degeneration and hypertrophy left more than right.  Stenosis of the subarticular lateral recess on the left that could  compress the S1 nerve root. Mild foraminal narrowing left more than  right but without apparent compression of the exiting L5 nerve  roots.  Serpiginous fluid-filled structure in the left pelvis could  represent hydrosalpinx. This is not primarily or completely imaged.  IMPRESSION:  Degenerative disc disease and degenerative facet disease in the  lower lumbar spine that could be a cause of lumbago.  Lateral recess and foraminal narrowing at L3-4, L4-5 and L5-S1 as  described above that could be a cause of focal neural compression.  In general, this is more pronounced on the left than on the right.  In particular, there is potential for left S1 nerve root compression  in the subarticular lateral recess at L5-S1.  Electronically Signed  By: Nelson Chimes M.D.  On: 12/10/2013 08:45   CT L-Spine 9/18:  CLINICAL DATA: Low back pain with difficulty walking.  EXAM:  CT LUMBAR SPINE WITHOUT CONTRAST  TECHNIQUE:  Multidetector CT imaging of the lumbar spine was performed without  intravenous contrast administration. Multiplanar CT image  reconstructions were also generated.  COMPARISON: None.  FINDINGS:  There is no fracture or spondylolisthesis. There is moderately  severe disc space narrowing at L5-S1 with slight vacuum phenomenon  at this level. There is moderate narrowing at L4-5. There are  prominent anterior osteophytes at L2, L3, L4, and L5.  At T12-L1, there is moderate facet hypertrophy bilaterally. There is  no nerve root edema or effacement. No disc extrusion or stenosis.  At L1-2, there is moderate facet hypertrophy bilaterally. There is  mild diffuse disc bulging. There is no nerve root edema or  effacement. No disc extrusion or stenosis.  At L2-3, there is broad-based disc bulging and moderate facet   hypertrophy bilaterally. There is borderline narrowing of the thecal  sac with borderline spinal stenosis. No disc extrusion.  At L3-4, there is severe facet osteoarthritic change bilaterally,  more pronounced on the left than on the right.  There is broad-based  disc bulging and moderate ligamentum flavum hypertrophy. These  findings in concert lead to moderate generalized spinal stenosis.  At L4-5, there is broad-based disc protrusion and severe facet  osteoarthritic change bilaterally. These findings in concert lead to  moderate generalized spinal stenosis. No frank disc extrusion.  At L5-S1, there is severe facet osteoarthritic change on the left  and moderate facet osteoarthritic change on the right. There is  broad-based disc protrusion. There is exit foraminal narrowing  bilaterally, more severe on the left than on the right, due to  osteophytic change. No frank disc extrusion or stenosis is seen at  this level.  There are no paraspinous lesions.  IMPRESSION:  Spinal stenosis at L3-4 and L4-5, multifactorial. Borderline spinal  stenosis at L2-3. There does appear to be a degree of congenital  narrowing of the canal which contributes to spinal stenosis.  Multilevel arthropathy.  No frank disc extrusion.  No fracture or spondylolisthesis.  Electronically Signed  By: Lowella Grip M.D.  On: 12/09/2013 13:50   CXR 9/13:  CLINICAL DATA: Upper back pain for 5 weeks. Dyspnea on exertion.  Nonsmoker. History of hypertension.  EXAM:  CHEST 2 VIEW  COMPARISON: 04/15/2006  FINDINGS:  The heart is mildly enlarged. There is elevation of right  hemidiaphragm, stable in appearance. There is streaky density  favored to be within the right lower lobe, consistent with  atelectasis or infiltrate. No pulmonary edema. No pleural effusions.  There are moderate degenerative changes in the thoracic spine.  IMPRESSION:  Right lower lobe atelectasis or infiltrate.  Electronically Signed   By: Shon Hale M.D.  On: 12/04/2013 19:26  Venous Duplex 9/24:  Summary:  - No evidence of deep vein thrombosis involving the right lower extremity. - No evidence of deep vein thrombosis involving the left lower extremity.   Surgical Pathology Report: 12/21/2013 Diagnosis Soft tissue mass, simple excision, Thoracic Epidural Tumor - METASTATIC CARCINOMA. PLEASE SEE COMMENT. Microscopic Comment There are clusters of malignant cells with abundant eosinophilic cytoplasm. A battery of immunostains were performed and the tumor cells show the following profile: Cytokeratin 7 - strongly positive Cytokeratin 8/18 - strongly positive TTF-1 - strongly positive. Thyroglobulin - negative CD10 - negative Cytokeratin 20 - negative S100 - negative Vimentin - negative The control stained appropriately. The overall findings are diagnostic for metastatic carcinoma. 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 However, a metastatic oncocytic thyroid carcinoma is still in the differential. Clinical and radiographic correlation is highly recommended. (HCL:kh 12-19-13) Aldona Bar MD Pathologist, Electronic Signature (Case signed 12/20/2013)  Elberta Leatherwood, MD 12/28/2013, 2:53 PM PGY-1, [facility] pager: [phone], text pages welcome

## 2013-12-28 NOTE — Progress Notes (Signed)
ANTICOAGULATION CONSULT NOTE - Follow Up Consult  Pharmacy Consult:  Heparin Indication: pulmonary embolus  Allergies  Allergen Reactions  . Tramadol Nausea And Vomiting    Patient Measurements: Height: 5\' 11"  (180.3 cm) Weight: 313 lb 15 oz (142.4 kg) IBW/kg (Calculated) : 70.8 Heparin Dosing Weight: 104 kg  Vital Signs: Temp: 98.2 F (36.8 C) (10/07 0937) Temp Source: Oral (10/07 0937) BP: 125/73 mmHg (10/07 0937) Pulse Rate: 81 (10/07 0937)  Labs:  Recent Labs  12/26/13 0650 12/27/13 0701 12/28/13 0917  HGB 12.1 12.1 12.3  HCT 35.0* 36.0 36.7  PLT 315 303 305  HEPARINUNFRC 0.66 0.38 0.67  CREATININE  --  0.77 0.82    Estimated Creatinine Clearance: 133.1 ml/min (by C-G formula based on Cr of 0.82).   Assessment: 28 yof continues on IV heparin with a level of 0.67 on rate 1600 units/hr for acute PE. This is at the upper end of goal range but has been fluctuating greatly despite no recent dose changes. CBC is stable and no bleeding noted.  She was admitted on 9/21 with LE weakness and found to have mass at T-3 level with spinal compression. S/p surgery on 12/15/13 for decompression and biopsy.  Found to have a PE on CT chest and started on IV heparin 48 hours post-op.  LE venous duplex 12/15/13 negative for DVT.  Hypercoagulable state likely from cancer. 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 Continue heparin at 1600 units/hr 2. Daily heparin level and CBC 3. Follow-up plans for long-term anticoagulation.  Long-term Lovenox is preferred based on the oncology guidelines. Dose would be 150mg  SQ q12h.  (or 140mg  q12h)  Wt 142kg  Salome Arnt, PharmD, BCPS Pager # [phone] 12/28/2013 11:14 AM

## 2013-12-28 NOTE — Progress Notes (Signed)
Patient ID: Sarah [name], female   DOB: 12-Mar-1966, 48 y.o.   MRN: [id number] BP 129/79  Pulse 93  Temp(Src) 98.6 F (37 C) (Oral)  Resp 17  Ht 5\' 11"  (1.803 m)  Wt 142.4 kg (313 lb 15 oz)  BMI 43.80 kg/m2  SpO2 100%  LMP 11/28/2013 Plan on surgery as an add on Friday night. Would involve fixation from T1-T5. Have discussed with patient. No change in neurologic exam.

## 2013-12-28 NOTE — Progress Notes (Signed)
Chaplain initiated follow up with pt and family. Chaplain facilitated storytelling of pt illness and support systems. Pt has a positive outlook for prognosis. Pt and family thanked chaplain for her presence and asked chaplain to return. Chaplain will follow as needed.   12/28/13 1600  Clinical Encounter Type  Visited With Patient and family together  Visit Type Follow-up  Spiritual Encounters  Spiritual Needs Emotional  Stress Factors  Patient Stress Factors Health changes;Family relationships  Family Stress Factors Health changes;Major life changes  Sarah [name] 12/28/2013 4:35 PM

## 2013-12-29 ENCOUNTER — Other Ambulatory Visit (HOSPITAL_COMMUNITY): Payer: Self-pay | Admitting: Neurosurgery

## 2013-12-29 ENCOUNTER — Encounter (HOSPITAL_COMMUNITY): Payer: Self-pay

## 2013-12-29 DIAGNOSIS — C73 Malignant neoplasm of thyroid gland: Secondary | ICD-10-CM

## 2013-12-29 LAB — GLUCOSE, CAPILLARY
GLUCOSE-CAPILLARY: 231 mg/dL — AB (ref 70–99)
Glucose-Capillary: 213 mg/dL — ABNORMAL HIGH (ref 70–99)
Glucose-Capillary: 263 mg/dL — ABNORMAL HIGH (ref 70–99)
Glucose-Capillary: 241 mg/dL — ABNORMAL HIGH (ref 70–99)

## 2013-12-29 LAB — CBC
HCT: 35.2 % — ABNORMAL LOW (ref 36.0–46.0)
Hemoglobin: 11.9 g/dL — ABNORMAL LOW (ref 12.0–15.0)
MCH: 28.7 pg (ref 26.0–34.0)
MCHC: 33.8 g/dL (ref 30.0–36.0)
MCV: 84.8 fL (ref 78.0–100.0)
Platelets: 323 10*3/uL (ref 150–400)
RBC: 4.15 MIL/uL (ref 3.87–5.11)
RDW: 14.8 % (ref 11.5–15.5)
WBC: 11.6 10*3/uL — ABNORMAL HIGH (ref 4.0–10.5)

## 2013-12-29 LAB — HEPARIN LEVEL (UNFRACTIONATED): HEPARIN UNFRACTIONATED: 0.69 [IU]/mL (ref 0.30–0.70)

## 2013-12-29 MED ORDER — DEXAMETHASONE 0.5 MG PO TABS
0.5000 mg | ORAL_TABLET | Freq: Every day | ORAL | Status: DC
  Filled 2013-12-29: qty 1

## 2013-12-29 NOTE — Progress Notes (Signed)
Family Medicine Teaching Service Daily Progress Note Intern Pager: [phone]  Patient name: Sarah [name] Medical record number: [id number] Date of birth: 11-Jul-1965 Age: 48 y.o. Gender: female  Primary Care Provider: No PCP Per Patient Consultants: Neurosurgery, IR, Oncology Code Status: Full  Pt Overview and Major Events to Date:  - 9/22: Pt. Admitted with lower extremity weakness.  - 9/23: Pt. Found to have mass at T-3 level with spinal compression.  - 9/24: Surgery performed for decompression and biopsy overnight. Pt. Post-op and doing well. 3cm Suprahilar mass on CT Chest, PE also identified. ?Thyroid etiology, but much less likely for mets. Oncology consulted.  - 9/25 - 9/29: Awaiting pathology results with plan for oncology to see once path returns. Pt. With little neurological improvement. Started with light PT.  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 Oncology on board. Neurosurgery to comment on need for spinal stabilization surgery.  - 10/1: Radiology to perform thyroid US; Heterogeneous throughout, cannot r/o malignancy - 10/2: Thyroid Bx; results pending - 10/3-8: Decadron 35m BID >> 222mBID >> 35m735mID >> 35mg72m >> 0.5mg 52m- 10/7: Thyroid bx results--"similar to previous bx results"  Assessment and Plan: Sarah [name] 48 y.73 female presenting with continued lower extremity weakness at baseline after discharge from hospital on 9/21. PMH is significant for obesity, HTN, and ?History of Hypothyroidism previously on Synthroid per patient.   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  Neurosurgery took her to the OR on 9/23, and biopsied as well as performed laminectomy for decompression. 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 Overall this  indicates a likely very poor prognosis. 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  - PT recommendations SNF with 24hour supervision. Will plan dispo based on goals of care discussion.   - Per SW; SNF bed currently available, waiting for med stability; pt currently would rather go  home than to SNF  - Discussed Recs for PT with Neurosurgery. Light PT at this point. Pt. Not to get UOB.  - Pain control:  Oxy IR prn - Foley catheter in place for urinary retention; Monitor for fecal retention - pt. Continues to have BM on her own.   - Neurosurgery following: Dr. CabbeChristella [name] will take to OR as early as this week.   - Tapering Decadron:  10/3  4mg B75m(began on 9/24 at 6mg Q668m; will continue to taper    - today 0.5mg QD>85m tomorrow - Oncology following: Goals of care to be discussed with possible outpatient treatment.   # Thyroid Nodule - Large thyroid goiter noted. TSH normal; ultrasound (10/1) showing diffuse heterogenicity, cannot r/o malignancy, Biopsy on 10/2 - FNA results: "clusters of neoplastic cells w/ granular/oncogenic cytoplasm--similar to previous bx"  #Pulmonary Embolus - Found on CT Chest  Pt. With brief episodes of tachycardia, but otherwise asymptomatic with O2 sats 100% on 2L [place].  Blood pressures stable. Lower extremity Venous Duplex negative for DVT.  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 ESR elevated.  - Now on IV heparin -- Will begin bridging as soon as possible (post surgical)  - Per pharm--recommendation for long-term Lovenox; I doubt this will be possible, patient is self-pay. If it is possible, Dose would be 150mg SQ 68m;  will plan to discuss anticoagulation w/ Onc (Dr. Julien Nordmann)  - Will continue to monitor VS. Stable at this time. - Continue heparin for now given the potential for surgery.   #Hyperglycemia -  Likely diabetic with PCOS given A1C of 6.7. CBG's 200's - 300.  - Patient informed of elevated  A1C and likely diagnosis of DMII. She agrees with [place] that this is a marginal issue at this point, and we will recommend treatment based upon our goals of care discussion.  - Continue to monitor.  - Sensitive SSI; 15U Lantus. (consider back down to Lantus 10U tomorrow w/ weaning decadron) - Consider backing off on insulin dose as decadron is tapered down.  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 PCOS vs. Adrenal patholog. Would be unlike to be first presentation of HPA pathology; no evidence of salt wasting on labs. Hypertensive and not hypotensive. Testing during this hospitalization after having recently received steroids would be largely non-diagnostic. Though, could also be a combination of isolated processes i.e. Clitoromegaly, Hirsutism 2/2 obesity. - Recent thought of outpt vs inpt w/up including [place] to assess ovaries vs 21-hydroxylase/ACTH/cortisol.  - TSH, Cortisol WNL - A1C : 6.7 - consistent with PCOS given evidence of cystic structures in pelvis.   # UTI, 2/2 foley catheter - UCx = Proteus  - dysuria and suprapubic pain noted PM 10/1. - Cipro 231m BID (10/2 started) -- will treat for a total of 7 days  #HTN- BP fairly well controlled -continue losartan.  - Blood pressure well controlled here.     #Obesity-  - consistent with potential PCOS.   FEN/GI: Reg diet; KVO; s/p Enema with some relief Prophylaxis: on full dose IV heparin  Disposition: Pending Goals of Care discussion. Neurosurgery to address need for surgical spinal stabilization.   Subjective: States that she really only needs her pain meds for bed, otherwise shes very comfortable. She seems to be keeping her spirits high. No new issues today. BMs have been daily. Will continue to monitor. I informed patient I would personally see her after discussing bx w/ Dr. MJulien Nordmann Objective: Temp:  [98.1 F (36.7 C)-98.6 F (37 C)] 98.6 F (37 C)  (10/08 0940) Pulse Rate:  [88-107] 107 (10/08 0940) Resp:  [17-20] 20 (10/08 0940) BP: (112-149)/(65-83) 112/71 mmHg (10/08 0940) SpO2:  [99 %-100 %] 100 % (10/08 0940) Physical Exam: General: NAD, AAOx3, morbidly obese  HEENT: Thyromegaly / Goiter noted on exam without tenderness, and no nodules, no LAD, neck supple; significant adipose tissue at base of posterior neck ("hump") Abdomen: Soft and nontender, mildly distended, No organomegaly noted.  Extremities: no edema,  No calf tenderness, 2+ distal pulses bilaterally.    Skin: No lesions or rashes noted, incision over T-spine C/D/I Neuro: cranial nerves grossly normal. Normal speech; 0/5  Plantarflexion BL feet. 0/5 Hip Flexor / extensor movement. Full; sensation to light touch, possibly L>R. + babinski bilaterally, and hyperreflexivity noted. Neuro exam is at new baseline. No new changes. No sensory deficits, and poor motor exam.   Laboratory:  Recent Labs Lab 12/27/13 0701 12/28/13 0917 12/29/13 0801  WBC 11.7* 13.0* 11.6*  HGB 12.1 12.3 11.9*  HCT 36.0 36.7 35.2*  PLT 303 305 323    Recent Labs Lab 12/23/13 1344 12/27/13 0701 12/28/13 0917  NA 132* 130* 132*  K 5.1 4.8 4.5  CL 93* 91* 90*  CO2 _0 BUN 31* 25* 27*  CREATININE 0.71 0.77 0.82  CALCIUM 9.0 9.0 8.8  GLUCOSE 214* 239* 241*   Urinalysis    Component Value Date/Time   COLORURINE AMBER* 12/23/2013 1058   APPEARANCEUR TURBID* 12/23/2013 1058   LABSPEC 1.018 12/23/2013 1058   PHURINE 7.5 12/23/2013 1058   GLUCOSEU NEGATIVE 12/23/2013 1058   HGBUR LARGE* 12/23/2013 1058   BILIRUBINUR NEGATIVE 12/23/2013 1058   KETONESUR NEGATIVE 12/23/2013 1058   PROTEINUR 100* 12/23/2013 1058   UROBILINOGEN 0.2 12/23/2013 1058   NITRITE NEGATIVE 12/23/2013 1058   LEUKOCYTESUR LARGE* 12/23/2013 1058    Phos - 3.9 Mg - 2.2 CRP - 11.6 ESR - 59 CK - 56 B12 - 209 Random Cortisol - 25.4 A1C - 6.7 TSH - 2.13 RPR - nonreactive HIV - nonreactive UPT -  Negative  Imaging/Diagnostic Tests:  CT Chest Abd/ Pelvis 9/23:  CLINICAL DATA: Destructive metastatic lesion of the T3 vertebral  body with no known primary carcinoma.  EXAM:  CT CHEST, ABDOMEN, AND PELVIS WITH CONTRAST  TECHNIQUE:  Multidetector CT imaging of the chest, abdomen and pelvis was  performed following the standard protocol during bolus  administration of intravenous contrast.  CONTRAST: 131m OMNIPAQUE IOHEXOL 300 MG/ML SOLN  COMPARISON: MRI of the cervical spine earlier today.  FINDINGS:  CT CHEST FINDINGS  Within the chest, a right suprahilar mass measures approximately 3.0  x 2.7 x 2.2 cm. This either represents a nodal metastasis or central  lung mass such as small cell carcinoma. A number of smaller  mediastinal lymph nodes are also identified in the right  paratracheal region, prevascular space and AP window.  Destructive lesion of the T3 vertebral body noted which nearly  replaces the entire vertebral body with tumor extension posteriorly  into the epidural space causing significant compression of the  spinal cord. Paravertebral extension of tumor is also suspected  lateral and anterior to the vertebral body, right greater than left.  Massive enlargement of the thyroid gland present with transverse  dimensions of approximately 7.7 x 8.5 cm. The thyroid gland is  incompletely visualized as the neck was not included. Central  dystrophic calcification identified. The enlarged thyroid gland does  cause significant mass effect on the airway with evidence of  tracheal deviation to the right and tracheal narrowing at the  thoracic inlet. The enlarged thyroid gland extends into the superior  mediastinum.  There is evidence of pulmonary embolism with nonocclusive thrombus  identified in the right middle lobe and lower lobe pulmonary  arteries. Pulmonary arterial opacification is not optimal on the  standard venous phase evaluation of the chest but the right sided   pulmonary embolism is clearly present.  There are small bilateral pulmonary nodules. Right-sided pulmonary  nodule measuring 6 mm likely localizes to the very top of the lower  lobe near the major fissure. Left upper lobe nodule measures 7 mm.  Lingular nodule measures 7 mm. Left lower lobe nodule measures 7 mm.  Faint peripheral right middle lobe nodule measures 3 mm. Inferior  right middle lobe nodule measures 6 mm. Several small nodules in the  posterior right upper lobe identified in the 3-4 mm range.  No evidence of pleural or pericardial fluid. No other bony lesions  identified in the thorax.  CT ABDOMEN AND PELVIS FINDINGS  Small low-density lesion in the left lobe of the liver measures 7 mm  and likely represents a cyst. No other hepatic abnormalities. The  gallbladder, pancreas, spleen, adrenal glands and kidneys are  unremarkable. No adenopathy  is identified in the abdomen or pelvis.  Bowel shows normal caliber without evidence of obstruction, lesion  or inflammation. No abnormal fluid collections.  The bladder is moderately distended. Elongated cystic changes in  both adnexal regions identified which may represent a combination of  ovarian cysts and potentially hydrosalpinx. Correlation with pelvic  ultrasound is recommended. No hernias are identified. The uterus  appears unremarkable by CT. No bony lesions are identified in the  abdomen or pelvis.  IMPRESSION:  1. 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  2. 3 cm right suprahilar mass may represent nodal metastasis versus  primary central tumor. Next number multiple small bilateral  pulmonary nodules consistent with metastatic disease.  3. Acute nonocclusive pulmonary embolism in the right middle lobe  and right lower lobe pulmonary arteries.  4. Massive thyroid goiter causing tracheal  deviation to the right  and narrowing of the trachea.  5. Tubular cystic abnormalities of both adnexal regions which may be  a combination of cystic changes in the ovaries and potentially  hydrosalpinx. Correlation recommended with pelvic ultrasound to  exclude any suspicious cystic lesions of the adnexae.  6. Critical Value/emergent results were called by telephone at the  time of interpretation on 12/14/2013 at 4:52 pm to Dr. Tamala Julian of the  [facility] Medicine Teaching Service, who verbally acknowledged these  results.  Electronically Signed  By: Aletta Edouard M.D.  On: 12/14/2013 16:53   MRI T-spine 9/23:  CLINICAL DATA: Lower extremity weakness. Worsening back pain.  Compressive upper thoracic mass partially visualized on cervical  spine MRI as well as chest CT with spinal cord compression.  EXAM:  MRI THORACIC SPINE WITHOUT CONTRAST  TECHNIQUE:  Multiplanar, multisequence MR imaging of the thoracic spine was  performed. No intravenous contrast was administered.  COMPARISON: Cervical spine MRI and chest CT earlier today  FINDINGS:  There is a T3 compression fracture with approximately 50% height  loss centrally. As described on cervical spine MRI and chest CT  earlier today, there is abnormal soft tissue replacing the majority  of the T3 vertebral body extending into the pedicles as well as into  the ventral epidural space with moderate cord compression. Epidural  extension is slightly greater on the right and extends into the  inferior aspect of the right T2-3 neural foramen as well as superior  aspect of the right T3-4 neural foramen. There is also abnormal  paravertebral soft tissue laterally and anteriorly as seen on CT.  Central ventral epidural material posterior to the T4 vertebral body  may represent tumor or hematoma. Mild T2 hyperintensity is present  in the spinal cord at the T3 level extending inferiorly to the T4-5  disc space level consistent with mild edema. Mild  edema is present  in the posterior paraspinal soft tissues of the upper thoracic  spine. No other vertebral body lesions are identified.  There is a moderate-sized central disc extrusion at T7-8 which  contacts and mildly flattens the ventral spinal cord with mild  spinal stenosis. A small left central disc protrusion is present at  T8-9 with at most minimal impression on the spinal cord. Massive  enlargement of the left thyroid lobe is partially visualized.  Mediastinal and lung findings are better demonstrated on recent  chest CT.  IMPRESSION:  1. T3 vertebral body mass with pathologic compression fracture and  epidural tumor resulting in moderate cord  compression with evidence  of mild spinal cord edema.  2. T7-8 disc extrusion resulting in mild spinal stenosis.   MRI Head / C-Spine 9/23:  CLINICAL DATA: 48 year old female who presented with back pain and  lower extremity numbness, with continued inadequately explained  lower extremity weakness following Hospital discharge on 12/12/2013.  Initial encounter.  EXAM:  MRI HEAD WITHOUT AND WITH CONTRAST  MRI CERVICAL SPINE WITHOUT AND WITH CONTRAST  TECHNIQUE:  Multiplanar, multiecho pulse sequences of the brain and surrounding  structures, and cervical spine, to include the craniocervical  junction and cervicothoracic junction, were obtained without and  with intravenous contrast.  CONTRAST: 20 mL MultiHance.  COMPARISON: Lumbar MRI 12/10/2013.  FINDINGS:  MRI HEAD FINDINGS  Cerebral volume is normal. No restricted diffusion to suggest acute  infarction. No midline shift, mass effect, evidence of mass lesion,  ventriculomegaly, extra-axial collection or acute intracranial  hemorrhage. Cervicomedullary junction and pituitary are within  normal limits. Major intracranial vascular flow voids are preserved.  Pearline Cables and white matter signal is within normal limits for age  throughout the brain. No abnormal enhancement identified,  pulsation  artifact prominent on axial post-contrast images.  Large body habitus. Small right frontal scalp lipoma (series 10,  image 37). Visible internal auditory structures appear normal. Mild  right mastoid fluid. Minor paranasal sinus mucosal thickening.  Visualized orbit soft tissues are within normal limits. Partially  visible thyromegaly greater on the left, see below. Normal bone  marrow signal.  MRI CERVICAL SPINE FINDINGS  Large body habitus resulting in wrap artifact on axial images.  However, there is massive thyroid enlargement evident, with the left  lobe measuring at least 7.2 x 5.2 x 11.2 cm (AP by transverse by  CC). The trachea is displaced to the right at the thoracic inlet and  mildly compressed. There is other regional mass effect including  sheath. On the left carotid  Fairly preserved cervical lordosis. No marrow edema or evidence of  acute osseous abnormality. Cervicomedullary junction is within  normal limits. Spinal cord signal is within normal limits at all  visualized levels. No abnormal intradural enhancement.  C2-C3: Negative.  C3-C4: Disc osteophyte complex, facet and ligament flavum  hypertrophy. Along with a degree of congenital canal narrowing,  there is mild spinal stenosis and moderate biforaminal stenosis.  C4-C5: Mild uncovertebral and facet hypertrophy. Mild right C5  foraminal stenosis.  C5-C6: Circumferential disc osteophyte complex. Along with a degree  of congenital canal narrowing there is mild spinal stenosis.  Moderate right C6 foraminal stenosis.  C6-C7: Negative except for mild facet hypertrophy.  C7-T1: Negative.  T1-T2: Negative.  Faintly visualized at the T3 level in the upper thoracic spine there  is a lobulated solidly enhancing mass which appears to extend into  the epidural space from the T3 vertebral body. This is inseparable  from the spinal cord which is likely compressed. See series 2000  image 7 and series 1500, image 6.  This is not included on axial  images.  IMPRESSION:  1. Faintly visible but aggressive appearing enhancing mass at the T3  level of the upper thoracic spine, likely with spinal cord  compression evaluate further are with thoracic MRI without and with  contrast.  2. Normal for age MRI appearance of the brain.  3. Massive thyroid enlargement, greater on the left. Left thyroid  goiter/mass measuring at least 7.2 x 5.2 x 11.2 cm. Mass effect at  the thoracic inlet including on the trachea.  4. Mild chronic  disc and endplate degeneration in the cervical spine  at C3-C4 and C5-C6 resulting in borderline to mild spinal stenosis,  but no cervical spinal cord mass effect or signal abnormality.  Electronically Signed:  By: Lars Pinks M.D.  On: 12/14/2013 09:50   MRI L-Spine 9/19:  CLINICAL DATA: Back pain with bilateral leg numbness, worsening  with oral steroids.  EXAM:  MRI LUMBAR SPINE WITHOUT CONTRAST  TECHNIQUE:  Multiplanar, multisequence MR imaging of the lumbar spine was  performed. No intravenous contrast was administered.  COMPARISON: CT 12/09/2013  FINDINGS:  There is no degenerative disease at L2-3 or above. The discs are  normal in signal characteristics and morphology. The spinal canal  and foramina are widely patent. The distal cord and conus are normal  with conus tip at L1. There are small benign and insignificant  hemangiomas within the L2 and L3 vertebral bodies.  L3-4: Minimal desiccation of the disc. Mild circumferential bulging  slightly more prominent towards the left. Bilateral facet  degeneration and hypertrophy. Mild narrowing of the lateral recesses  without apparent neural compression. Mild foraminal narrowing on the  left without apparent neural compression.  L4-5: Desiccation of the disc with circumferential protrusion.  Bilateral facet and ligamentous hypertrophy. Stenosis of both  lateral recesses without definite focal neural compression. Mild  foraminal  narrowing bilaterally.  L5-S1: Desiccation of the disc with loss of height. Endplate  osteophytes and bulging of the disc more prominent towards the left.  Bilateral facet degeneration and hypertrophy left more than right.  Stenosis of the subarticular lateral recess on the left that could  compress the S1 nerve root. Mild foraminal narrowing left more than  right but without apparent compression of the exiting L5 nerve  roots.  Serpiginous fluid-filled structure in the left pelvis could  represent hydrosalpinx. This is not primarily or completely imaged.  IMPRESSION:  Degenerative disc disease and degenerative facet disease in the  lower lumbar spine that could be a cause of lumbago.  Lateral recess and foraminal narrowing at L3-4, L4-5 and L5-S1 as  described above that could be a cause of focal neural compression.  In general, this is more pronounced on the left than on the right.  In particular, there is potential for left S1 nerve root compression  in the subarticular lateral recess at L5-S1.  Electronically Signed  By: Nelson Chimes M.D.  On: 12/10/2013 08:45   CT L-Spine 9/18:  CLINICAL DATA: Low back pain with difficulty walking.  EXAM:  CT LUMBAR SPINE WITHOUT CONTRAST  TECHNIQUE:  Multidetector CT imaging of the lumbar spine was performed without  intravenous contrast administration. Multiplanar CT image  reconstructions were also generated.  COMPARISON: None.  FINDINGS:  There is no fracture or spondylolisthesis. There is moderately  severe disc space narrowing at L5-S1 with slight vacuum phenomenon  at this level. There is moderate narrowing at L4-5. There are  prominent anterior osteophytes at L2, L3, L4, and L5.  At T12-L1, there is moderate facet hypertrophy bilaterally. There is  no nerve root edema or effacement. No disc extrusion or stenosis.  At L1-2, there is moderate facet hypertrophy bilaterally. There is  mild diffuse disc bulging. There is no nerve root  edema or  effacement. No disc extrusion or stenosis.  At L2-3, there is broad-based disc bulging and moderate facet  hypertrophy bilaterally. There is borderline narrowing of the thecal  sac with borderline spinal stenosis. No disc extrusion.  At L3-4, there is severe facet osteoarthritic change bilaterally,  more pronounced on the left than on the right. There is broad-based  disc bulging and moderate ligamentum flavum hypertrophy. These  findings in concert lead to moderate generalized spinal stenosis.  At L4-5, there is broad-based disc protrusion and severe facet  osteoarthritic change bilaterally. These findings in concert lead to  moderate generalized spinal stenosis. No frank disc extrusion.  At L5-S1, there is severe facet osteoarthritic change on the left  and moderate facet osteoarthritic change on the right. There is  broad-based disc protrusion. There is exit foraminal narrowing  bilaterally, more severe on the left than on the right, due to  osteophytic change. No frank disc extrusion or stenosis is seen at  this level.  There are no paraspinous lesions.  IMPRESSION:  Spinal stenosis at L3-4 and L4-5, multifactorial. Borderline spinal  stenosis at L2-3. There does appear to be a degree of congenital  narrowing of the canal which contributes to spinal stenosis.  Multilevel arthropathy.  No frank disc extrusion.  No fracture or spondylolisthesis.  Electronically Signed  By: Lowella Grip M.D.  On: 12/09/2013 13:50   CXR 9/13:  CLINICAL DATA: Upper back pain for 5 weeks. Dyspnea on exertion.  Nonsmoker. History of hypertension.  EXAM:  CHEST 2 VIEW  COMPARISON: 04/15/2006  FINDINGS:  The heart is mildly enlarged. There is elevation of right  hemidiaphragm, stable in appearance. There is streaky density  favored to be within the right lower lobe, consistent with  atelectasis or infiltrate. No pulmonary edema. No pleural effusions.  There are moderate degenerative  changes in the thoracic spine.  IMPRESSION:  Right lower lobe atelectasis or infiltrate.  Electronically Signed  By: Shon Hale M.D.  On: 12/04/2013 19:26  Venous Duplex 9/24:  Summary:  - No evidence of deep vein thrombosis involving the right lower extremity. - No evidence of deep vein thrombosis involving the left lower extremity.   Surgical Pathology Report: 12/21/2013 Diagnosis Soft tissue mass, simple excision, Thoracic Epidural Tumor - METASTATIC CARCINOMA. PLEASE SEE COMMENT. Microscopic Comment There are clusters of malignant cells with abundant eosinophilic cytoplasm. A battery of immunostains were performed and the tumor cells show the following profile: Cytokeratin 7 - strongly positive Cytokeratin 8/18 - strongly positive TTF-1 - strongly positive. Thyroglobulin - negative CD10 - negative Cytokeratin 20 - negative S100 - negative Vimentin - negative The control stained appropriately. The overall findings are diagnostic for metastatic carcinoma. 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 However, a metastatic oncocytic thyroid carcinoma is still in the differential. Clinical and radiographic correlation is highly recommended. (HCL:kh 12-19-13) Aldona Bar MD Pathologist, Electronic Signature (Case signed 12/20/2013)  Elberta Leatherwood, MD 12/29/2013, 12:41 PM PGY-1, [place] Intern pager: [phone], text pages welcome

## 2013-12-29 NOTE — Consult Note (Addendum)
Reason for Consult: oncocytic thyroid carcinoma with skeletal metastases Referring Physician: Dr. Mammie Lorenzo CRISTYN CROSSNO is an 48 y.o. female.  HPI:   About 8 weeks ago, Ms. Jaimes started noticing how her legs "felt funny".  Her gait became unsteady. Eventually it became quite difficult to walk. She was able to work until 12/09/2013. Associated numbness in her feet with a sensation "like a shock wave".  She recalls a nuclear medicine thyroid scan to check for thyroid cancer about 25 years ago, but "nothing showed up". She recalls taking Synthroid for a few years, but Synthroid was discontinued over 20 years ago.  In recent years, she thought she simply had a "double chin".  Her family medical history includes a maternal grandmother requiring thyroid surgery, but no thyroid cancer. She now takes Synthroid. The patient's sister takes Synthroid for hypothyroidism.  The patient's maternal aunt received radioactive iodine treatment for hyperthyroidism.  The patient's mother had carcinoid tumor of gallbladder.  No personal history of radiation exposure--apart from diagnostic imaging.   Past Medical History  Diagnosis Date  . Hypertension   . DDD (degenerative disc disease), cervical   . DJD (degenerative joint disease)   . Obesity   . Chronic back pain     Past Surgical History  Procedure Laterality Date  . Tonsillectomy    . Laminectomy N/A 12/14/2013    Procedure: THORACIC LAMINECTOMY FOR TUMOR THORACIC THREE;  Surgeon: Ashok Pall, MD;  Location: [facility];  Service: Neurosurgery;  Laterality: N/A;  THORACIC LAMINECTOMY FOR TUMOR THORACIC THREE    Family History  Problem Relation Age of Onset  . Hypertension Mother   . Diabetes Mother   . Hypertension Father   . Stroke Father     Social History:  reports that she has never smoked. She does not have any smokeless tobacco history on file. She reports that she does not drink alcohol or use illicit drugs.  Allergies:  Allergies   Allergen Reactions  . Tramadol Nausea And Vomiting    Medications: I have reviewed the patient's current medications.  ROS General: No weight loss, no weight gain. Endocrine:  No cold intolerance, heat intolerance: "menopause". Eyes:  No eye redness, no bulging of eyes. Neck:  No hoarseness, no difficulty swallowing. Cardiovascular: No palpitations, occasional edema in ankles after prolonged standing at work. Gastrointestinal: No diarrhea, no constipation. Neurologic: No tremor. Psychiatric: No anxiety, no depression. Skin: No rash, no jaundice. Hematologic/lymphatic: No abnormal bruising, no cervical adenopathy. Reproductive: Last menstrual period 11/28/2013, irregular infrequent menses throughout her life currently as infrequently as once a year.  Blood pressure 117/71, pulse 94, temperature 98.7 F (37.1 C), temperature source Oral, resp. rate 20, height 5\' 11"  (1.803 m), weight 142.4 kg (313 lb 15 oz), last menstrual period 11/28/2013, SpO2 100.00%. Physical Exam General: No apparent distress. Eyes: Anicteric, no scleral show, no periorbital puffiness, no lid lag. Neck: Supple, unable to visualize or palpate trachea due to goiter. Thyroid: Considerable enlargement of thyroid gland with excessive firmness.   Cardiovascular: Regular rhythm and rate, no murmur, normal radial pulses. Respiratory: Normal respiratory effort, clear to auscultation. Gastrointestinal: Nontender abdomen without distention or appreciable hepatomegaly. Neurologic: Cranial nerves normal as tested, biceps deep tendon reflexes 1+, no tremor. Musculoskeletal: Normal muscle tone, no muscle atrophy. Skin: Appropriate warmth, no visible rash. Mental status: Alert, conversant, speech clear, thought logical, appropriate mood and affect, no hallucinations or delusions evident. Hematologic/lymphatic: No palpable cervical adenopathy, no jaundice.  Lab Results  Component Value Date  TSH 2.130 12/13/2013   CRTSLPL  25.4 12/13/2013   CALCIUM 8.8 12/28/2013   WBC 11.6* 12/29/2013   HGB 11.9* 12/29/2013   PLT 323 12/29/2013   Assessment/Plan: 1.   Oncocytic thyroid carcinoma with skeletal metastases, stage IV C  I reviewed laboratory reports, hospital notes, radiology reports, thyroid ultrasound images, cytopathology report, and surgical pathology report.  Education provided including patient handouts from the [employer] on the following topics:  thyroid cancer, thyroid hormone treatment, and radioactive iodine.  Low iodine cookbook provided from the Thyroid [employer] Surviv[employer] website.  Information provided on a local support group for the Thyroid Cancer Surviv[employer] Association.  Recommendations: 1.  Consult Dr. Armandina Gemma for thyroid surgery.   2.  After thyroid surgery, start Synthroid 125 microgram tablets TWO TABLETS by mouth once a day (250 micrograms per day will provide almost 1.8 micrograms of levothyroxine per kilogram of body weight).  TSH goal <0.1 uIU/mL.  (note: she may need 300 micrograms per day to achieve TSH suppression) 3.  At least 6 weeks after thyroid surgery:  anticipate radioactive iodine treatment with around 200 mCi of I-131 after thyroid hormone withdrawal for 2 to 3 weeks (until TSH rises above 30 uIU/mL) and 2 to 3 weeks of low-iodine diet prior to radioactive iodine treatment. 4.  After her thyroid surgery, we can discuss options such as bisphosphonate therapy due to bone metastases, lithium therapy at time of radioactive iodine treatment, etc.  Informed consent obtained after discussion of anticipated benefits, alternatives, nature of therapy, and risks--including but not limited to insomnia, anxiety, bone loss, atrial arrhythmias, etc. from suppressive thyroid hormone therapy.    After her thyroid surgery, we can discuss the details regarding radioactive iodine treatment--including low-iodine diet, thyroid hormone withdrawal, radiation safety  precautions, etc.  Augusta Hilbert 12/29/2013, 7:15 PM

## 2013-12-29 NOTE — Progress Notes (Signed)
Physical Therapy Treatment Patient Details Name: Sarah [name] MRN: [id number] DOB: 07/13/1965 Today's Date: 12/29/2013    History of Present Illness 48 y.o. female admitted to [facility] on 12/12/13 with progressive lower extremity weakness and mid to upper back pain.  Pt was found to have a mass at T3 with (+) spinal cord compression.  On 9/23 pt underwent surgery for decompression and biopsy.  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  Her thyroid is very enlarged.  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  Planned to proceed with thoracic stabilization surgery on 12/29/13.  Pt with significant PMHx of HTN and chronic back pain.    PT Comments    Pt is hopeful for a better outcome with new dx of primary thyroid CA instead of primary lung CA.  She reports family has reviewed the HEP handout of PROM for bil legs and her mom is on board with helping preform these.  PT will continue to follow and await pot-op mobility orders. We will need a new order after surgery to resume PT evaluation and treatment.   Follow Up Recommendations  CIR     Equipment Recommendations  Wheelchair (measurements PT);Wheelchair cushion (measurements PT);Hospital bed;Other (comment);3in1 (PT) (slide board, drop arm 3-in-1)    Recommendations for Other Services   NA     Precautions / Restrictions Precautions Precautions: Fall Precaution Comments: bil leg weakness, parapelegia          Cognition Arousal/Alertness: Awake/alert Behavior During Therapy: WFL for tasks assessed/performed Overall Cognitive Status: Within Functional Limits for tasks assessed                      Exercises Other Exercises Other Exercises: PROM preformed to bil legs ankle DF, knee/hip flexion with external rotation, hip abduction x 10 reps each 5-10 second holds each.      General Comments General comments (skin integrity, edema, etc.): Pt with a  mild increase in right leg flexion tone today compared to last session, but with PROM able to work through tone and pt is maintaing her motion well.  Ankles can still get to neutral DF.        Pertinent Vitals/Pain Pain Assessment: 0-10 Pain Score: 2  Pain Location: mid back Pain Descriptors / Indicators: Aching;Burning Pain Intervention(s): Limited activity within patient's tolerance;Monitored during session;Repositioned           PT Goals (current goals can now be found in the care plan section) Acute Rehab PT Goals Patient Stated Goal: to get her legs better, figure out a solid dx so that she can get treatment.  Progress towards PT goals: Progressing toward goals    Frequency  Min 2X/week (until post op and hopefully OOB/increased mobility orders)    PT Plan Current plan remains appropriate       End of Session   Activity Tolerance: Patient tolerated treatment well Patient left: in bed;with call bell/phone within reach     Time: 2423-5361 PT Time Calculation (min): 14 min  Charges:  $Therapeutic Exercise: 8-22 mins                      Kenzley Ke B. Gianpaolo Mindel, PT, DPT [phone]   12/29/2013, 4:24 PM

## 2013-12-29 NOTE — Progress Notes (Signed)
Patient ID: Sarah [name], female   DOB: 29-Dec-1965, 48 y.o.   MRN: [id number] BP 117/71  Pulse 94  Temp(Src) 98.7 F (37.1 C) (Oral)  Resp 20  Ht 5\' 11"  (1.803 m)  Wt 142.4 kg (313 lb 15 oz)  BMI 43.80 kg/m2  SpO2 100%  LMP 11/28/2013 Alert, oriented x 4 Speech is clear and fluent Will go for stabilization tomorrow.  Will call interventional regarding core biopsy.

## 2013-12-29 NOTE — Progress Notes (Signed)
ANTICOAGULATION CONSULT NOTE - Follow Up Consult  Pharmacy Consult:  Heparin Indication: pulmonary embolus  Allergies  Allergen Reactions  . Tramadol Nausea And Vomiting    Patient Measurements: Height: 5\' 11"  (180.3 cm) Weight: 313 lb 15 oz (142.4 kg) IBW/kg (Calculated) : 70.8 Heparin Dosing Weight: 104 kg  Vital Signs: Temp: 98.6 F (37 C) (10/08 0940) Temp Source: Oral (10/08 0940) BP: 112/71 mmHg (10/08 0940) Pulse Rate: 107 (10/08 0940)  Labs:  Recent Labs  12/27/13 0701 12/28/13 0917 12/29/13 0801  HGB 12.1 12.3 11.9*  HCT 36.0 36.7 35.2*  PLT 303 305 323  HEPARINUNFRC 0.38 0.67 0.69  CREATININE 0.77 0.82  --     Estimated Creatinine Clearance: 133.1 ml/min (by C-G formula based on Cr of 0.82).   Assessment: 64 yof continues on IV heparin with a level of 0.69 on rate 1600 units/hr for acute PE. The heparin level remains in therapeutic range on current heparin rate. H/H decreased slightly today 11.9/35.2,  pltc 323K is stable and no bleeding noted.  She was admitted on 9/21 with LE weakness and found to have mass at T-3 level with spinal compression. S/p surgery on 12/15/13 for decompression and biopsy.  Found to have a PE on CT chest and started on IV heparin 48 hours post-op.  LE venous duplex 12/15/13 negative for DVT.  Hypercoagulable state likely from cancer. 9/30: Path returns with metastatic adenocarcinoma likely lung primary given ndule on chest CT  Neuro notes plan is for surgery as an add on Friday night 12/30/13.   Goal of Therapy:  Heparin level 0.3-0.7 units/ml Monitor platelets by anticoagulation protocol: Yes   Plan:  1. Continue heparin at 1600 units/hr 2. Daily heparin level and CBC 3. Follow-up plans for long-term anticoagulation.  Long-term Lovenox is preferred based on the oncology guidelines. Dose would be 140mg  q12h-  Wt 142kg  Nicole Cella, RPh Clinical Pharmacist Pager: [phone] 12/29/2013 12:25 PM

## 2013-12-29 NOTE — Progress Notes (Signed)
UR COMPLETED  

## 2013-12-29 NOTE — Progress Notes (Signed)
Nursing staff paged about changing patient to NPO for tomorrow's procedure. Pt has a heparin drip. Contacted pharmacy about this. Per pharmacy heparin can be held 4-6hrs prior to surgery. However, this decision will be left for surgery to make. Called OR for scheduling -- last case prior to pt's is scheduled to complete ~3pm. This gives day team time to reconnect w/ NS tomorrow morning if this issue is still unanswered. No need to page NS at this time.  Elberta Leatherwood, MD,MS,  PGY1 12/29/2013 8:27 PM

## 2013-12-29 NOTE — Progress Notes (Signed)
ADDENDUM:  FNA of the thyroid performed 12/23/2013 shows (NZA [phone]) an atypical oncocytic tumor identical morphologically and by immuno stains to the metastatic carcinoma removed from patient's spine 12/14/2013 ([id number]).   Although Dr Nicoletta Dress in pathology tells me the diagnosis of oncocytic/Hurthle cell cancer requires evidence of lymphatic or capsular invasion, since we have proven metastatic disease I do not think adding a core biopsy to tomorrow's procedure would be necessary and I would back off from that initial plan.  The treatment of thyroid cancers calls for a team including endocrine and surgery as well as eventually nuclear medicine. I will ask Drs Delrae Rend and Armandina Gemma to consult on this case. A reasonable initial plan may consist of total thyroidectomy and TSH suppression with later evaluation for radioiodine treatment. I do not find chemotherapy to be of much use in these cases but at some point kinase inhibitors might help prolong a response.  We previously thought we were dealing with metastatic NSCLC and requested EGFR and ALK testing on the tumor-- I have cancelled that and also alerted Dr Julien Nordmann, our lung cancer subspecialist, that he does not need to consult on this case.   I have discussed the above with the patient's mother, Joaquim Lai, who is also her health care power of attorney.  Will follow with you.

## 2013-12-29 NOTE — Progress Notes (Signed)
Inpatient Diabetes Program Recommendations  AACE/ADA: New Consensus Statement on Inpatient Glycemic Control (2013)  Target Ranges:  Prepandial:   less than 140 mg/dL      Peak postprandial:   less than 180 mg/dL (1-2 hours)      Critically ill patients:  140 - 180 mg/dL   Results for Sarah [name], Sarah [name] (MRN [id number]) as of 12/29/2013 15:12  Ref. Range 12/28/2013 12:16 12/28/2013 16:44 12/28/2013 21:53 12/29/2013 06:32 12/29/2013 11:43  Glucose-Capillary Latest Range: 70-99 mg/dL 257 (H) 311 (H) 256 (H) 213 (H) 231 (H)   Reason for Visit: Elevated blood sugar  Diabetes history: None Outpatient Diabetes medications: None Current orders for Inpatient glycemic control: Lantus 15 units daily, Novolog correction 0-5 units at hs and Novolog correction 0-20 units 3x/day with meals  Based on current blood sugars may want to consider increasing Lantus to 20 units daily (142kg x 0.15= 21) and 3-4 units Novolog ac meals.  Gentry Fitz, RN, BA, MHA, CDE Diabetes Coordinator Inpatient Diabetes Program  [phone] (Team Pager) [phone] [facility]) 12/29/2013 3:20 PM

## 2013-12-29 NOTE — Progress Notes (Signed)
Family Medicine Teaching Service Attending Note  I interviewed and examined patient Sarah [name] and reviewed their tests and x-rays.  I discussed with Dr. Alease Frame and reviewed their note for today.  I agree with their assessment and plan.     Additionally  Discuss bx with oncology

## 2013-12-30 ENCOUNTER — Encounter (HOSPITAL_COMMUNITY)
Admission: EM | Disposition: A | Discharge status: Rehabilitation Facility | Payer: Self-pay | Source: Home / Self Care | Attending: Family Medicine

## 2013-12-30 ENCOUNTER — Encounter (HOSPITAL_COMMUNITY): Payer: Medicaid Other | Admitting: Anesthesiology

## 2013-12-30 ENCOUNTER — Inpatient Hospital Stay (HOSPITAL_COMMUNITY): Payer: Medicaid Other

## 2013-12-30 ENCOUNTER — Encounter (HOSPITAL_COMMUNITY): Payer: Self-pay | Admitting: Anesthesiology

## 2013-12-30 ENCOUNTER — Encounter (HOSPITAL_COMMUNITY): Payer: Self-pay

## 2013-12-30 ENCOUNTER — Inpatient Hospital Stay (HOSPITAL_COMMUNITY): Payer: Medicaid Other | Admitting: Anesthesiology

## 2013-12-30 DIAGNOSIS — D34 Benign neoplasm of thyroid gland: Secondary | ICD-10-CM | POA: Diagnosis present

## 2013-12-30 HISTORY — PX: POSTERIOR LUMBAR FUSION 4 LEVEL: SHX6037

## 2013-12-30 LAB — GLUCOSE, CAPILLARY
Glucose-Capillary: 237 mg/dL — ABNORMAL HIGH (ref 70–99)
Glucose-Capillary: 169 mg/dL — ABNORMAL HIGH (ref 70–99)
Glucose-Capillary: 276 mg/dL — ABNORMAL HIGH (ref 70–99)

## 2013-12-30 LAB — CBC
HEMATOCRIT: 34.4 % — AB (ref 36.0–46.0)
HEMOGLOBIN: 11.5 g/dL — AB (ref 12.0–15.0)
MCH: 29 pg (ref 26.0–34.0)
MCHC: 33.4 g/dL (ref 30.0–36.0)
MCV: 86.9 fL (ref 78.0–100.0)
Platelets: 309 10*3/uL (ref 150–400)
RBC: 3.96 MIL/uL (ref 3.87–5.11)
RDW: 14.9 % (ref 11.5–15.5)
WBC: 11.2 10*3/uL — ABNORMAL HIGH (ref 4.0–10.5)

## 2013-12-30 LAB — HEPARIN LEVEL (UNFRACTIONATED): Heparin Unfractionated: 0.45 IU/mL (ref 0.30–0.70)

## 2013-12-30 SURGERY — POSTERIOR LUMBAR FUSION 4 LEVEL
Anesthesia: General | Site: Back

## 2013-12-30 MED ORDER — ROCURONIUM BROMIDE 50 MG/5ML IV SOLN
INTRAVENOUS | Status: AC
  Filled 2013-12-30: qty 1

## 2013-12-30 MED ORDER — HYDROMORPHONE HCL 1 MG/ML IJ SOLN: 0.2500 mg | INTRAMUSCULAR | Status: DC | PRN

## 2013-12-30 MED ORDER — CEFAZOLIN SODIUM-DEXTROSE 2-3 GM-% IV SOLR
INTRAVENOUS | Status: AC
  Administered 2013-12-30: 2 g via INTRAVENOUS
  Filled 2013-12-30: qty 50

## 2013-12-30 MED ORDER — SCOPOLAMINE 1 MG/3DAYS TD PT72
1.0000 | MEDICATED_PATCH | TRANSDERMAL | Status: DC
  Filled 2013-12-30: qty 1

## 2013-12-30 MED ORDER — MIDAZOLAM HCL 5 MG/5ML IJ SOLN
INTRAMUSCULAR | Status: DC | PRN
  Administered 2013-12-30 (×2): 1 mg via INTRAVENOUS

## 2013-12-30 MED ORDER — ROCURONIUM BROMIDE 100 MG/10ML IV SOLN
INTRAVENOUS | Status: DC | PRN
  Administered 2013-12-30: 10 mg via INTRAVENOUS
  Administered 2013-12-30: 20 mg via INTRAVENOUS
  Administered 2013-12-30: 10 mg via INTRAVENOUS
  Administered 2013-12-30: 30 mg via INTRAVENOUS
  Administered 2013-12-30: 10 mg via INTRAVENOUS

## 2013-12-30 MED ORDER — LACTATED RINGERS IV SOLN
INTRAVENOUS | Status: DC | PRN
  Administered 2013-12-30 (×2): via INTRAVENOUS

## 2013-12-30 MED ORDER — GLYCOPYRROLATE 0.2 MG/ML IJ SOLN
INTRAMUSCULAR | Status: DC | PRN
  Administered 2013-12-30: .8 mg via INTRAVENOUS

## 2013-12-30 MED ORDER — NEOSTIGMINE METHYLSULFATE 10 MG/10ML IV SOLN
INTRAVENOUS | Status: DC | PRN
  Administered 2013-12-30: 5 mg via INTRAVENOUS

## 2013-12-30 MED ORDER — PROPOFOL 10 MG/ML IV BOLUS
INTRAVENOUS | Status: AC
  Filled 2013-12-30: qty 20

## 2013-12-30 MED ORDER — FENTANYL CITRATE 0.05 MG/ML IJ SOLN
INTRAMUSCULAR | Status: AC
  Filled 2013-12-30: qty 5

## 2013-12-30 MED ORDER — ONDANSETRON HCL 4 MG/2ML IJ SOLN
INTRAMUSCULAR | Status: AC
  Filled 2013-12-30: qty 2

## 2013-12-30 MED ORDER — ONDANSETRON HCL 4 MG/2ML IJ SOLN
INTRAMUSCULAR | Status: DC | PRN
  Administered 2013-12-30: 4 mg via INTRAVENOUS

## 2013-12-30 MED ORDER — ARTIFICIAL TEARS OP OINT
TOPICAL_OINTMENT | OPHTHALMIC | Status: DC | PRN
  Administered 2013-12-30: 1 via OPHTHALMIC

## 2013-12-30 MED ORDER — GLYCOPYRROLATE 0.2 MG/ML IJ SOLN
INTRAMUSCULAR | Status: AC
  Filled 2013-12-30: qty 4

## 2013-12-30 MED ORDER — SURGIFOAM 100 EX MISC
CUTANEOUS | Status: DC | PRN
  Administered 2013-12-30: 16:00:00 via TOPICAL

## 2013-12-30 MED ORDER — LIDOCAINE HCL (CARDIAC) 20 MG/ML IV SOLN
INTRAVENOUS | Status: AC
  Filled 2013-12-30: qty 5

## 2013-12-30 MED ORDER — NEOSTIGMINE METHYLSULFATE 10 MG/10ML IV SOLN
INTRAVENOUS | Status: AC
  Filled 2013-12-30: qty 1

## 2013-12-30 MED ORDER — MORPHINE SULFATE 2 MG/ML IJ SOLN: 2.0000 mg | INTRAMUSCULAR | Status: DC | PRN

## 2013-12-30 MED ORDER — ACETAMINOPHEN 10 MG/ML IV SOLN
1000.0000 mg | Freq: Once | INTRAVENOUS | Status: AC
  Administered 2013-12-30: 1000 mg via INTRAVENOUS
  Filled 2013-12-30: qty 100

## 2013-12-30 MED ORDER — OXYCODONE HCL 5 MG/5ML PO SOLN: 5.0000 mg | Freq: Once | ORAL | Status: DC | PRN

## 2013-12-30 MED ORDER — PROPOFOL 10 MG/ML IV BOLUS
INTRAVENOUS | Status: DC | PRN
  Administered 2013-12-30: 260 mg via INTRAVENOUS

## 2013-12-30 MED ORDER — SCOPOLAMINE 1 MG/3DAYS TD PT72
MEDICATED_PATCH | TRANSDERMAL | Status: AC
  Administered 2013-12-30: 1 via TRANSDERMAL
  Filled 2013-12-30: qty 1

## 2013-12-30 MED ORDER — ONDANSETRON HCL 4 MG/2ML IJ SOLN: 4.0000 mg | Freq: Once | INTRAMUSCULAR | Status: DC | PRN

## 2013-12-30 MED ORDER — ONDANSETRON HCL 4 MG/2ML IJ SOLN: INTRAMUSCULAR | Status: DC | PRN

## 2013-12-30 MED ORDER — 0.9 % SODIUM CHLORIDE (POUR BTL) OPTIME
TOPICAL | Status: DC | PRN
  Administered 2013-12-30: 1000 mL

## 2013-12-30 MED ORDER — MIDAZOLAM HCL 2 MG/2ML IJ SOLN
INTRAMUSCULAR | Status: AC
  Filled 2013-12-30: qty 2

## 2013-12-30 MED ORDER — ARTIFICIAL TEARS OP OINT
TOPICAL_OINTMENT | OPHTHALMIC | Status: AC
  Filled 2013-12-30: qty 3.5

## 2013-12-30 MED ORDER — OXYCODONE HCL 5 MG PO TABS: 5.0000 mg | ORAL_TABLET | Freq: Once | ORAL | Status: DC | PRN

## 2013-12-30 MED ORDER — LIDOCAINE HCL (CARDIAC) 20 MG/ML IV SOLN
INTRAVENOUS | Status: DC | PRN
  Administered 2013-12-30: 80 mg via INTRAVENOUS

## 2013-12-30 MED ORDER — PHENYLEPHRINE HCL 10 MG/ML IJ SOLN
INTRAMUSCULAR | Status: DC | PRN
  Administered 2013-12-30 (×3): 80 ug via INTRAVENOUS

## 2013-12-30 MED ORDER — DEXAMETHASONE SODIUM PHOSPHATE 4 MG/ML IJ SOLN
INTRAMUSCULAR | Status: DC | PRN
  Administered 2013-12-30: 4 mg via INTRAVENOUS

## 2013-12-30 MED ORDER — DEXAMETHASONE SODIUM PHOSPHATE 4 MG/ML IJ SOLN
INTRAMUSCULAR | Status: AC
  Filled 2013-12-30: qty 1

## 2013-12-30 MED ORDER — BACITRACIN ZINC 500 UNIT/GM EX OINT
TOPICAL_OINTMENT | CUTANEOUS | Status: DC | PRN
  Administered 2013-12-30: 1 via TOPICAL

## 2013-12-30 MED ORDER — ACETAMINOPHEN 10 MG/ML IV SOLN
INTRAVENOUS | Status: AC
Start: 2013-12-30 — End: 2013-12-31
  Filled 2013-12-30: qty 100

## 2013-12-30 MED ORDER — FENTANYL CITRATE 0.05 MG/ML IJ SOLN
INTRAMUSCULAR | Status: DC | PRN
  Administered 2013-12-30 (×3): 50 ug via INTRAVENOUS
  Administered 2013-12-30: 100 ug via INTRAVENOUS
  Administered 2013-12-30 (×5): 50 ug via INTRAVENOUS

## 2013-12-30 MED ORDER — GLYCOPYRROLATE 0.2 MG/ML IJ SOLN
INTRAMUSCULAR | Status: AC
  Filled 2013-12-30: qty 2

## 2013-12-30 MED ORDER — BUPIVACAINE HCL (PF) 0.5 % IJ SOLN
INTRAMUSCULAR | Status: DC | PRN
  Administered 2013-12-30: 30 mL

## 2013-12-30 MED ORDER — SUCCINYLCHOLINE CHLORIDE 20 MG/ML IJ SOLN
INTRAMUSCULAR | Status: DC | PRN
  Administered 2013-12-30: 160 mg via INTRAVENOUS

## 2013-12-30 MED ORDER — PHENYLEPHRINE HCL 10 MG/ML IJ SOLN
10.0000 mg | INTRAVENOUS | Status: DC | PRN
  Administered 2013-12-30: 20 ug/min via INTRAVENOUS

## 2013-12-30 SURGICAL SUPPLY — 73 items
BAG DECANTER FOR FLEXI CONT (MISCELLANEOUS) ×3 IMPLANT
BENZOIN TINCTURE PRP APPL 2/3 (GAUZE/BANDAGES/DRESSINGS) IMPLANT
BIT DRILL 2.4X (BIT) ×1 IMPLANT
BIT DRL 2.4X (BIT) ×1
BLADE CLIPPER SURG (BLADE) IMPLANT
BUR MATCHSTICK NEURO 3.0 LAGG (BURR) ×3 IMPLANT
CANISTER SUCT 3000ML (MISCELLANEOUS) ×3 IMPLANT
CLOSURE WOUND 1/2 X4 (GAUZE/BANDAGES/DRESSINGS)
CONNECTOR SM MULTIAXIAL (Neuro Prosthesis/Implant) ×3 IMPLANT
CONT SPEC 4OZ CLIKSEAL STRL BL (MISCELLANEOUS) ×3 IMPLANT
COVER BACK TABLE 24X17X13 BIG (DRAPES) IMPLANT
DECANTER SPIKE VIAL GLASS SM (MISCELLANEOUS) ×3 IMPLANT
DERMABOND ADVANCED (GAUZE/BANDAGES/DRESSINGS)
DERMABOND ADVANCED .7 DNX12 (GAUZE/BANDAGES/DRESSINGS) IMPLANT
DRAPE C-ARM 42X72 X-RAY (DRAPES) ×6 IMPLANT
DRAPE LAPAROTOMY 100X72X124 (DRAPES) ×3 IMPLANT
DRAPE POUCH INSTRU U-SHP 10X18 (DRAPES) ×3 IMPLANT
DRAPE SURG 17X23 STRL (DRAPES) IMPLANT
DRILL BIT (BIT) ×2
DRSG OPSITE POSTOP 4X10 (GAUZE/BANDAGES/DRESSINGS) ×3 IMPLANT
DRSG TELFA 3X8 NADH (GAUZE/BANDAGES/DRESSINGS) IMPLANT
DURAPREP 26ML APPLICATOR (WOUND CARE) ×3 IMPLANT
ELECT BLADE 4.0 EZ CLEAN MEGAD (MISCELLANEOUS) ×3
ELECT REM PT RETURN 9FT ADLT (ELECTROSURGICAL) ×3
ELECTRODE BLDE 4.0 EZ CLN MEGD (MISCELLANEOUS) ×1 IMPLANT
ELECTRODE REM PT RTRN 9FT ADLT (ELECTROSURGICAL) ×1 IMPLANT
GAUZE SPONGE 4X4 12PLY STRL (GAUZE/BANDAGES/DRESSINGS) IMPLANT
GAUZE SPONGE 4X4 16PLY XRAY LF (GAUZE/BANDAGES/DRESSINGS) IMPLANT
GLOVE BIO SURGEON STRL SZ 6.5 (GLOVE) ×6 IMPLANT
GLOVE BIO SURGEON STRL SZ8.5 (GLOVE) ×3 IMPLANT
GLOVE BIO SURGEONS STRL SZ 6.5 (GLOVE) ×3
GLOVE BIOGEL PI IND STRL 6.5 (GLOVE) ×2 IMPLANT
GLOVE BIOGEL PI INDICATOR 6.5 (GLOVE) ×4
GLOVE ECLIPSE 6.5 STRL STRAW (GLOVE) ×6 IMPLANT
GLOVE EXAM NITRILE LRG STRL (GLOVE) IMPLANT
GLOVE EXAM NITRILE MD LF STRL (GLOVE) IMPLANT
GLOVE EXAM NITRILE XL STR (GLOVE) IMPLANT
GLOVE EXAM NITRILE XS STR PU (GLOVE) IMPLANT
GLOVE SS BIOGEL STRL SZ 8 (GLOVE) ×1 IMPLANT
GLOVE SUPERSENSE BIOGEL SZ 8 (GLOVE) ×2
GOWN STRL REUS W/ TWL LRG LVL3 (GOWN DISPOSABLE) ×4 IMPLANT
GOWN STRL REUS W/ TWL XL LVL3 (GOWN DISPOSABLE) ×1 IMPLANT
GOWN STRL REUS W/TWL 2XL LVL3 (GOWN DISPOSABLE) IMPLANT
GOWN STRL REUS W/TWL LRG LVL3 (GOWN DISPOSABLE) ×8
GOWN STRL REUS W/TWL XL LVL3 (GOWN DISPOSABLE) ×2
GRAFT BN 10X1XDBM MAGNIFUSE (Bone Implant) ×1 IMPLANT
GRAFT BONE MAGNIFUSE 1X10CM (Bone Implant) ×2 IMPLANT
KIT BASIN OR (CUSTOM PROCEDURE TRAY) ×3 IMPLANT
KIT POSITION SURG JACKSON T1 (MISCELLANEOUS) ×3 IMPLANT
KIT ROOM TURNOVER OR (KITS) ×3 IMPLANT
NEEDLE HYPO 25X1 1.5 SAFETY (NEEDLE) ×3 IMPLANT
NEEDLE SPNL 18GX3.5 QUINCKE PK (NEEDLE) ×3 IMPLANT
NS IRRIG 1000ML POUR BTL (IV SOLUTION) ×3 IMPLANT
PACK LAMINECTOMY NEURO (CUSTOM PROCEDURE TRAY) ×3 IMPLANT
PAD ARMBOARD 7.5X6 YLW CONV (MISCELLANEOUS) ×15 IMPLANT
ROD VERTEX 100MM (Rod) ×6 IMPLANT
SCREW 4.0X20 (Screw) ×6 IMPLANT
SCREW 4.5X24 (Screw) ×6 IMPLANT
SCREW MAS 3.5X26MM (Screw) ×12 IMPLANT
SCREW SET M6 (Screw) ×24 IMPLANT
SPONGE LAP 4X18 X RAY DECT (DISPOSABLE) IMPLANT
SPONGE SURGIFOAM ABS GEL 100 (HEMOSTASIS) ×3 IMPLANT
STRIP CLOSURE SKIN 1/2X4 (GAUZE/BANDAGES/DRESSINGS) IMPLANT
SUT ETHILON 3 0 FSL (SUTURE) ×3 IMPLANT
SUT PROLENE 6 0 BV (SUTURE) IMPLANT
SUT VIC AB 0 CT1 18XCR BRD8 (SUTURE) ×2 IMPLANT
SUT VIC AB 0 CT1 8-18 (SUTURE) ×4
SUT VIC AB 2-0 CT1 18 (SUTURE) ×6 IMPLANT
SUT VIC AB 3-0 SH 8-18 (SUTURE) ×6 IMPLANT
SYR 20ML ECCENTRIC (SYRINGE) ×3 IMPLANT
TOWEL OR 17X24 6PK STRL BLUE (TOWEL DISPOSABLE) ×3 IMPLANT
TOWEL OR 17X26 10 PK STRL BLUE (TOWEL DISPOSABLE) ×3 IMPLANT
WATER STERILE IRR 1000ML POUR (IV SOLUTION) ×3 IMPLANT

## 2013-12-30 NOTE — Progress Notes (Signed)
Pt alert and verbal with no complaints voiced. Heparin drip was discontinued per MD this am.

## 2013-12-30 NOTE — Transfer of Care (Signed)
Immediate Anesthesia Transfer of Care Note  Patient: Sarah [name]  Procedure(s) Performed: Procedure(s) with comments: Thoracic one-Thoracic five posterior thoracic fusion with pedicle screws (N/A) - Thoracic one-Thoracic five posterior thoracic fusion with pedicle screws  Patient Location: PACU  Anesthesia Type:General  Level of Consciousness: awake and oriented  Airway & Oxygen Therapy: Patient Spontanous Breathing and Patient connected to nasal cannula oxygen  Post-op Assessment: Report given to PACU RN and Post -op Vital signs reviewed and stable  Post vital signs: Reviewed and stable  Complications: No apparent anesthesia complications

## 2013-12-30 NOTE — Clinical Social Work Note (Signed)
CSW met the pt and family at bedside. Pt expressed wishes to return home. CSW acknowledged the pt statement. The pt can tranfer to the  [facility] if she change her mind. CSW will continue to follow and provided support to the pt and family.   Brenas, MSW, [place]

## 2013-12-30 NOTE — Progress Notes (Signed)
Family Medicine Teaching Service Daily Progress Note Intern Pager: [phone]  Patient name: Sarah [name] Medical record number: [id number] Date of birth: 1965-10-23 Age: 48 y.o. Gender: female  Primary Care Provider: No PCP Per Patient Consultants: Neurosurgery, IR, Oncology Code Status: Full  Pt Overview and Major Events to Date:  - 9/22: Pt. Admitted with lower extremity weakness.  - 9/23: Pt. Found to have mass at T-3 level with spinal compression.  - 9/24: Surgery performed for decompression and biopsy overnight. Pt. Post-op and doing well. 3cm Suprahilar mass on CT Chest, PE also identified. ?Thyroid etiology, but much less likely for mets. Oncology consulted.  - 9/25 - 9/29: Awaiting pathology results with plan for oncology to see once path returns. Pt. With little neurological improvement. Started with light PT.  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 Oncology on board. Neurosurgery to comment on need for spinal stabilization surgery.  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  Assessment and Plan: Sarah [name] 48 y.[birth year] female presenting with continued lower extremity weakness at baseline after discharge from hospital on 9/21. PMH is significant for obesity, HTN, and ?History of Hypothyroidism previously on Synthroid per patient.   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  Neurosurgery took her to the OR on 9/23, and biopsied as well as performed laminectomy for decompression. 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  Overall this indicates a likely very poor prognosis. 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  - PT recommendations SNF with 24hour supervision. Will plan dispo based on goals of care discussion.   - Per SW; SNF bed currently available, waiting for med stability; pt currently would rather go  home than to SNF  - Discussed Recs for PT with Neurosurgery. Light PT at this point. Pt. Not to get UOB.  - Pain control:  Oxy IR prn - Foley catheter in place for urinary retention; Monitor for fecal retention - pt. Continues to have BM on her own.   - Neurosurgery following: Dr. CabbeChristella Noaes will take to OR as early as this week.   - Tapering Decadron:  10/3  4mg B42m(began on 9/24 at 6mg Q665m; will continue to taper    - today 0.5mg QD>17m tomorrow - Oncology following: Goals of care to be discussed with possible outpatient treatment.   # Thyroid; Suggestive of Hurthle Cell Carcinoma.  - Large thyroid goiter noted. TSH normal; ultrasound (10/1) showing diffuse heterogenicity, cannot r/o malignancy, Biopsy on 10/2 - FNA results: "clusters of neoplastic cells w/ granular/oncogenic cytoplasm--similar to previous bx" - Core bx planned to be done while under anesthesia for spinal stabilization--via IR  - Oncology consulted endocrine to schedule thyroidectomy. Appreciate the input. Will follow notes to determine timeline.   #Pulmonary Embolus - Found on CT Chest  Pt. With brief episodes of tachycardia, but otherwise asymptomatic with O2 sats 100% on 2L [place].  Blood pressures stable. Lower extremity Venous Duplex negative for DVT.  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 ESR elevated.  - Now  on IV heparin -- Will begin bridging as soon as possible (post surgical)  - Per pharm--recommendation for long-term Lovenox; I doubt this will be possible, patient is self-pay. If it is possible, Dose would be 166m SQ q12h; will plan to discuss  anticoagulation w/ Onc (Dr. MJulien Nordmann  - Will continue to monitor VS. Stable at this time. - DC heparin at 1200 today--due to surgery.  #Hyperglycemia -  Likely diabetic with PCOS given A1C of 6.7. CBG's 200's - 300.  - Patient informed of elevated A1C and likely diagnosis of DMII. She agrees with uKoreathat this is a marginal issue at this point, and we will recommend treatment based upon our goals of care discussion.  - Continue to monitor.  - Sensitive SSI; 15U Lantus. (consider back down to Lantus 10U tomorrow w/ weaning decadron) - Consider backing off on insulin dose as decadron is tapered down.  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 PCOS vs. Adrenal patholog. Would be unlike to be first presentation of HPA pathology; no evidence of salt wasting on labs. Hypertensive and not hypotensive. Testing during this hospitalization after having recently received steroids would be largely non-diagnostic. Though, could also be a combination of isolated processes i.e. Clitoromegaly, Hirsutism 2/2 obesity. - Recent thought of outpt vs inpt w/up including [place] assess ovaries vs 21-hydroxylase/ACTH/cortisol.  - TSH, Cortisol WNL - A1C : 6.7 - consistent with PCOS given evidence of cystic structures in pelvis.   # UTI, 2/2 foley catheter - UCx = Proteus  - dysuria and suprapubic pain noted PM 10/1. - Cipro 2515mBID (10/2 started) -- treated for 7 days -- DC today.  #HTN- BP fairly well controlled -continue losartan.  - Blood pressure well controlled here.     #Obesity-  - consistent with potential PCOS.   FEN/GI: Reg diet; KVO; s/p Enema with some relief Prophylaxis: on full dose IV heparin  Disposition: Pending Goals of Care discussion. Neurosurgery to address need for surgical spinal stabilization.   Subjective: [name] doing well. Some increased back pain due to lack of pain medication from NPO status overnight.  Patient says that she feels good about today's procedure. She appears very calm. Asking about possibility of having ice chips prior to surgery--I denied at this time.   Objective: Temp:  [98.2 F (36.8 C)-98.7 F (37.1 C)] 98.6 F (37 C) (10/09 0634) Pulse Rate:  [81-107] 90 (10/09 0634) Resp:  [20] 20 (10/09 0634) BP: (112-168)/(64-72) 122/72 mmHg (10/09 0634) SpO2:  [99 %-100 %] 99 % (10/09 067902Physical Exam: General: NAD, AAOx3, morbidly obese  HEENT: Thyromegaly / Goiter noted on exam without tenderness, and no nodules, no LAD, neck supple; significant adipose tissue at base of posterior neck ("hump") Abdomen: Soft and nontender, mildly distended, No organomegaly noted.  Extremities: no edema,  No calf tenderness, 2+ distal pulses bilaterally.    Skin: No lesions or rashes noted, incision over T-spine C/D/I Neuro: cranial nerves grossly normal. Normal speech; 0/5  Plantarflexion BL feet. 0/5 Hip Flexor / extensor movement. Full; sensation to light touch, possibly L>R. + babinski bilaterally, and hyperreflexivity noted. Neuro exam is at new baseline. No new changes. No sensory deficits, and poor motor exam.   Laboratory:  Recent Labs Lab 12/28/13 0917 12/29/13 0801 12/30/13 0650  WBC 13.0* 11.6* 11.2*  HGB 12.3 11.9* 11.5*  HCT 36.7 35.2* 34.4*  PLT 305 323 309    Recent Labs  Lab 12/23/13 1344 12/27/13 0701 12/28/13 0917  NA 132* 130* 132*  K 5.1 4.8 4.5  CL 93* 91* 90*  CO2 _0 BUN 31* 25* 27*  CREATININE 0.71 0.77 0.82  CALCIUM 9.0 9.0 8.8  GLUCOSE 214* 239* 241*   Urinalysis    Component Value Date/Time   COLORURINE AMBER* 12/23/2013 1058   APPEARANCEUR TURBID* 12/23/2013 1058   LABSPEC 1.018 12/23/2013 1058   PHURINE 7.5 12/23/2013 1058   GLUCOSEU NEGATIVE 12/23/2013 1058   HGBUR LARGE* 12/23/2013 1058   BILIRUBINUR NEGATIVE 12/23/2013 1058   KETONESUR NEGATIVE 12/23/2013 1058   PROTEINUR 100* 12/23/2013 1058   UROBILINOGEN 0.2 12/23/2013 1058   NITRITE  NEGATIVE 12/23/2013 1058   LEUKOCYTESUR LARGE* 12/23/2013 1058    Phos - 3.9 Mg - 2.2 CRP - 11.6 ESR - 59 CK - 56 B12 - 209 Random Cortisol - 25.4 A1C - 6.7 TSH - 2.13 RPR - nonreactive HIV - nonreactive UPT - Negative  Imaging/Diagnostic Tests:  CT Chest Abd/ Pelvis 9/23:  CLINICAL DATA: Destructive metastatic lesion of the T3 vertebral  body with no known primary carcinoma.  EXAM:  CT CHEST, ABDOMEN, AND PELVIS WITH CONTRAST  TECHNIQUE:  Multidetector CT imaging of the chest, abdomen and pelvis was  performed following the standard protocol during bolus  administration of intravenous contrast.  CONTRAST: 132m OMNIPAQUE IOHEXOL 300 MG/ML SOLN  COMPARISON: MRI of the cervical spine earlier today.  FINDINGS:  CT CHEST FINDINGS  Within the chest, a right suprahilar mass measures approximately 3.0  x 2.7 x 2.2 cm. This either represents a nodal metastasis or central  lung mass such as small cell carcinoma. A number of smaller  mediastinal lymph nodes are also identified in the right  paratracheal region, prevascular space and AP window.  Destructive lesion of the T3 vertebral body noted which nearly  replaces the entire vertebral body with tumor extension posteriorly  into the epidural space causing significant compression of the  spinal cord. Paravertebral extension of tumor is also suspected  lateral and anterior to the vertebral body, right greater than left.  Massive enlargement of the thyroid gland present with transverse  dimensions of approximately 7.7 x 8.5 cm. The thyroid gland is  incompletely visualized as the neck was not included. Central  dystrophic calcification identified. The enlarged thyroid gland does  cause significant mass effect on the airway with evidence of  tracheal deviation to the right and tracheal narrowing at the  thoracic inlet. The enlarged thyroid gland extends into the superior  mediastinum.  There is evidence of pulmonary embolism with  nonocclusive thrombus  identified in the right middle lobe and lower lobe pulmonary  arteries. Pulmonary arterial opacification is not optimal on the  standard venous phase evaluation of the chest but the right sided  pulmonary embolism is clearly present.  There are small bilateral pulmonary nodules. Right-sided pulmonary  nodule measuring 6 mm likely localizes to the very top of the lower  lobe near the major fissure. Left upper lobe nodule measures 7 mm.  Lingular nodule measures 7 mm. Left lower lobe nodule measures 7 mm.  Faint peripheral right middle lobe nodule measures 3 mm. Inferior  right middle lobe nodule measures 6 mm. Several small nodules in the  posterior right upper lobe identified in the 3-4 mm range.  No evidence of pleural or pericardial fluid. No other bony lesions  identified in the thorax.  CT ABDOMEN AND PELVIS FINDINGS  Small low-density lesion in  the left lobe of the liver measures 7 mm  and likely represents a cyst. No other hepatic abnormalities. The  gallbladder, pancreas, spleen, adrenal glands and kidneys are  unremarkable. No adenopathy is identified in the abdomen or pelvis.  Bowel shows normal caliber without evidence of obstruction, lesion  or inflammation. No abnormal fluid collections.  The bladder is moderately distended. Elongated cystic changes in  both adnexal regions identified which may represent a combination of  ovarian cysts and potentially hydrosalpinx. Correlation with pelvic  ultrasound is recommended. No hernias are identified. The uterus  appears unremarkable by CT. No bony lesions are identified in the  abdomen or pelvis.  IMPRESSION:  1. 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  2. 3 cm right suprahilar mass may represent nodal metastasis versus  primary central tumor. Next number multiple small bilateral   pulmonary nodules consistent with metastatic disease.  3. Acute nonocclusive pulmonary embolism in the right middle lobe  and right lower lobe pulmonary arteries.  4. Massive thyroid goiter causing tracheal deviation to the right  and narrowing of the trachea.  5. Tubular cystic abnormalities of both adnexal regions which may be  a combination of cystic changes in the ovaries and potentially  hydrosalpinx. Correlation recommended with pelvic ultrasound to  exclude any suspicious cystic lesions of the adnexae.  6. Critical Value/emergent results were called by telephone at the  time of interpretation on 12/14/2013 at 4:52 pm to Dr. Tamala Julian of the  [facility] Medicine Teaching Service, who verbally acknowledged these  results.  Electronically Signed  By: Aletta Edouard M.D.  On: 12/14/2013 16:53   MRI T-spine 9/23:  CLINICAL DATA: Lower extremity weakness. Worsening back pain.  Compressive upper thoracic mass partially visualized on cervical  spine MRI as well as chest CT with spinal cord compression.  EXAM:  MRI THORACIC SPINE WITHOUT CONTRAST  TECHNIQUE:  Multiplanar, multisequence MR imaging of the thoracic spine was  performed. No intravenous contrast was administered.  COMPARISON: Cervical spine MRI and chest CT earlier today  FINDINGS:  There is a T3 compression fracture with approximately 50% height  loss centrally. As described on cervical spine MRI and chest CT  earlier today, there is abnormal soft tissue replacing the majority  of the T3 vertebral body extending into the pedicles as well as into  the ventral epidural space with moderate cord compression. Epidural  extension is slightly greater on the right and extends into the  inferior aspect of the right T2-3 neural foramen as well as superior  aspect of the right T3-4 neural foramen. There is also abnormal  paravertebral soft tissue laterally and anteriorly as seen on CT.  Central ventral epidural material posterior to  the T4 vertebral body  may represent tumor or hematoma. Mild T2 hyperintensity is present  in the spinal cord at the T3 level extending inferiorly to the T4-5  disc space level consistent with mild edema. Mild edema is present  in the posterior paraspinal soft tissues of the upper thoracic  spine. No other vertebral body lesions are identified.  There is a moderate-sized central disc extrusion at T7-8 which  contacts and mildly flattens the ventral spinal cord with mild  spinal stenosis. A small left central disc protrusion is present at  T8-9 with at most minimal impression on the spinal cord. Massive  enlargement of the left thyroid lobe is partially visualized.  Mediastinal and lung findings are better demonstrated on recent  chest CT.  IMPRESSION:  1. T3 vertebral body mass with pathologic compression fracture and  epidural tumor resulting in moderate cord compression with evidence  of mild spinal cord edema.  2. T7-8 disc extrusion resulting in mild spinal stenosis.   MRI Head / C-Spine 9/23:  CLINICAL DATA: 48 year old female who presented with back pain and  lower extremity numbness, with continued inadequately explained  lower extremity weakness following Hospital discharge on 12/12/2013.  Initial encounter.  EXAM:  MRI HEAD WITHOUT AND WITH CONTRAST  MRI CERVICAL SPINE WITHOUT AND WITH CONTRAST  TECHNIQUE:  Multiplanar, multiecho pulse sequences of the brain and surrounding  structures, and cervical spine, to include the craniocervical  junction and cervicothoracic junction, were obtained without and  with intravenous contrast.  CONTRAST: 20 mL MultiHance.  COMPARISON: Lumbar MRI 12/10/2013.  FINDINGS:  MRI HEAD FINDINGS  Cerebral volume is normal. No restricted diffusion to suggest acute  infarction. No midline shift, mass effect, evidence of mass lesion,  ventriculomegaly, extra-axial collection or acute intracranial  hemorrhage. Cervicomedullary junction and  pituitary are within  normal limits. Major intracranial vascular flow voids are preserved.  Pearline Cables and white matter signal is within normal limits for age  throughout the brain. No abnormal enhancement identified, pulsation  artifact prominent on axial post-contrast images.  Large body habitus. Small right frontal scalp lipoma (series 10,  image 37). Visible internal auditory structures appear normal. Mild  right mastoid fluid. Minor paranasal sinus mucosal thickening.  Visualized orbit soft tissues are within normal limits. Partially  visible thyromegaly greater on the left, see below. Normal bone  marrow signal.  MRI CERVICAL SPINE FINDINGS  Large body habitus resulting in wrap artifact on axial images.  However, there is massive thyroid enlargement evident, with the left  lobe measuring at least 7.2 x 5.2 x 11.2 cm (AP by transverse by  CC). The trachea is displaced to the right at the thoracic inlet and  mildly compressed. There is other regional mass effect including  sheath. On the left carotid  Fairly preserved cervical lordosis. No marrow edema or evidence of  acute osseous abnormality. Cervicomedullary junction is within  normal limits. Spinal cord signal is within normal limits at all  visualized levels. No abnormal intradural enhancement.  C2-C3: Negative.  C3-C4: Disc osteophyte complex, facet and ligament flavum  hypertrophy. Along with a degree of congenital canal narrowing,  there is mild spinal stenosis and moderate biforaminal stenosis.  C4-C5: Mild uncovertebral and facet hypertrophy. Mild right C5  foraminal stenosis.  C5-C6: Circumferential disc osteophyte complex. Along with a degree  of congenital canal narrowing there is mild spinal stenosis.  Moderate right C6 foraminal stenosis.  C6-C7: Negative except for mild facet hypertrophy.  C7-T1: Negative.  T1-T2: Negative.  Faintly visualized at the T3 level in the upper thoracic spine there  is a lobulated solidly  enhancing mass which appears to extend into  the epidural space from the T3 vertebral body. This is inseparable  from the spinal cord which is likely compressed. See series 2000  image 7 and series 1500, image 6. This is not included on axial  images.  IMPRESSION:  1. Faintly visible but aggressive appearing enhancing mass at the T3  level of the upper thoracic spine, likely with spinal cord  compression evaluate further are with thoracic MRI without and with  contrast.  2. Normal for age MRI appearance of the brain.  3. Massive thyroid enlargement,  greater on the left. Left thyroid  goiter/mass measuring at least 7.2 x 5.2 x 11.2 cm. Mass effect at  the thoracic inlet including on the trachea.  4. Mild chronic disc and endplate degeneration in the cervical spine  at C3-C4 and C5-C6 resulting in borderline to mild spinal stenosis,  but no cervical spinal cord mass effect or signal abnormality.  Electronically Signed:  By: Lars Pinks M.D.  On: 12/14/2013 09:50   MRI L-Spine 9/19:  CLINICAL DATA: Back pain with bilateral leg numbness, worsening  with oral steroids.  EXAM:  MRI LUMBAR SPINE WITHOUT CONTRAST  TECHNIQUE:  Multiplanar, multisequence MR imaging of the lumbar spine was  performed. No intravenous contrast was administered.  COMPARISON: CT 12/09/2013  FINDINGS:  There is no degenerative disease at L2-3 or above. The discs are  normal in signal characteristics and morphology. The spinal canal  and foramina are widely patent. The distal cord and conus are normal  with conus tip at L1. There are small benign and insignificant  hemangiomas within the L2 and L3 vertebral bodies.  L3-4: Minimal desiccation of the disc. Mild circumferential bulging  slightly more prominent towards the left. Bilateral facet  degeneration and hypertrophy. Mild narrowing of the lateral recesses  without apparent neural compression. Mild foraminal narrowing on the  left without apparent neural  compression.  L4-5: Desiccation of the disc with circumferential protrusion.  Bilateral facet and ligamentous hypertrophy. Stenosis of both  lateral recesses without definite focal neural compression. Mild  foraminal narrowing bilaterally.  L5-S1: Desiccation of the disc with loss of height. Endplate  osteophytes and bulging of the disc more prominent towards the left.  Bilateral facet degeneration and hypertrophy left more than right.  Stenosis of the subarticular lateral recess on the left that could  compress the S1 nerve root. Mild foraminal narrowing left more than  right but without apparent compression of the exiting L5 nerve  roots.  Serpiginous fluid-filled structure in the left pelvis could  represent hydrosalpinx. This is not primarily or completely imaged.  IMPRESSION:  Degenerative disc disease and degenerative facet disease in the  lower lumbar spine that could be a cause of lumbago.  Lateral recess and foraminal narrowing at L3-4, L4-5 and L5-S1 as  described above that could be a cause of focal neural compression.  In general, this is more pronounced on the left than on the right.  In particular, there is potential for left S1 nerve root compression  in the subarticular lateral recess at L5-S1.  Electronically Signed  By: Nelson Chimes M.D.  On: 12/10/2013 08:45   CT L-Spine 9/18:  CLINICAL DATA: Low back pain with difficulty walking.  EXAM:  CT LUMBAR SPINE WITHOUT CONTRAST  TECHNIQUE:  Multidetector CT imaging of the lumbar spine was performed without  intravenous contrast administration. Multiplanar CT image  reconstructions were also generated.  COMPARISON: None.  FINDINGS:  There is no fracture or spondylolisthesis. There is moderately  severe disc space narrowing at L5-S1 with slight vacuum phenomenon  at this level. There is moderate narrowing at L4-5. There are  prominent anterior osteophytes at L2, L3, L4, and L5.  At T12-L1, there is moderate facet  hypertrophy bilaterally. There is  no nerve root edema or effacement. No disc extrusion or stenosis.  At L1-2, there is moderate facet hypertrophy bilaterally. There is  mild diffuse disc bulging. There is no nerve root edema or  effacement. No disc extrusion or stenosis.  At L2-3, there is broad-based disc bulging  and moderate facet  hypertrophy bilaterally. There is borderline narrowing of the thecal  sac with borderline spinal stenosis. No disc extrusion.  At L3-4, there is severe facet osteoarthritic change bilaterally,  more pronounced on the left than on the right. There is broad-based  disc bulging and moderate ligamentum flavum hypertrophy. These  findings in concert lead to moderate generalized spinal stenosis.  At L4-5, there is broad-based disc protrusion and severe facet  osteoarthritic change bilaterally. These findings in concert lead to  moderate generalized spinal stenosis. No frank disc extrusion.  At L5-S1, there is severe facet osteoarthritic change on the left  and moderate facet osteoarthritic change on the right. There is  broad-based disc protrusion. There is exit foraminal narrowing  bilaterally, more severe on the left than on the right, due to  osteophytic change. No frank disc extrusion or stenosis is seen at  this level.  There are no paraspinous lesions.  IMPRESSION:  Spinal stenosis at L3-4 and L4-5, multifactorial. Borderline spinal  stenosis at L2-3. There does appear to be a degree of congenital  narrowing of the canal which contributes to spinal stenosis.  Multilevel arthropathy.  No frank disc extrusion.  No fracture or spondylolisthesis.  Electronically Signed  By: Lowella Grip M.D.  On: 12/09/2013 13:50   CXR 9/13:  CLINICAL DATA: Upper back pain for 5 weeks. Dyspnea on exertion.  Nonsmoker. History of hypertension.  EXAM:  CHEST 2 VIEW  COMPARISON: 04/15/2006  FINDINGS:  The heart is mildly enlarged. There is elevation of right   hemidiaphragm, stable in appearance. There is streaky density  favored to be within the right lower lobe, consistent with  atelectasis or infiltrate. No pulmonary edema. No pleural effusions.  There are moderate degenerative changes in the thoracic spine.  IMPRESSION:  Right lower lobe atelectasis or infiltrate.  Electronically Signed  By: Shon Hale M.D.  On: 12/04/2013 19:26  Venous Duplex 9/24:  Summary:  - No evidence of deep vein thrombosis involving the right lower extremity. - No evidence of deep vein thrombosis involving the left lower extremity.   Surgical Pathology Report: 12/21/2013 Diagnosis Soft tissue mass, simple excision, Thoracic Epidural Tumor - METASTATIC CARCINOMA. PLEASE SEE COMMENT. Microscopic Comment There are clusters of malignant cells with abundant eosinophilic cytoplasm. A battery of immunostains were performed and the tumor cells show the following profile: Cytokeratin 7 - strongly positive Cytokeratin 8/18 - strongly positive TTF-1 - strongly positive. Thyroglobulin - negative CD10 - negative Cytokeratin 20 - negative S100 - negative Vimentin - negative The control stained appropriately. The overall findings are diagnostic for metastatic carcinoma. 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 However, a metastatic oncocytic thyroid carcinoma is still in the differential. Clinical and radiographic correlation is highly recommended. (HCL:kh 12-19-13) Aldona Bar MD Pathologist, Electronic Signature (Case signed 12/20/2013)  Elberta Leatherwood, MD 12/30/2013, 8:48 AM PGY-1, [place] Intern pager: [phone], text pages welcome

## 2013-12-30 NOTE — Anesthesia Postprocedure Evaluation (Signed)
  Anesthesia Post-op Note  Patient: Sarah [name]  Procedure(s) Performed: Procedure(s) with comments: Thoracic one-Thoracic five posterior thoracic fusion with pedicle screws (N/A) - Thoracic one-Thoracic five posterior thoracic fusion with pedicle screws  Patient Location: PACU  Anesthesia Type:General  Level of Consciousness: awake  Airway and Oxygen Therapy: Patient Spontanous Breathing  Post-op Pain: mild  Post-op Assessment: Post-op Vital signs reviewed  Post-op Vital Signs: Reviewed  Last Vitals:  Filed Vitals:   12/30/13 2038  BP:   Pulse: 78  Temp: 36.7 C  Resp: 17    Complications: No apparent anesthesia complications

## 2013-12-30 NOTE — Op Note (Signed)
12/12/2013 - 12/30/2013  7:19 PM  PATIENT:  Sarah [name]  48 y.o. female  PRE-OPERATIVE DIAGNOSIS:  thoracic epidural mets, thoracic instability /t3  POST-OPERATIVE DIAGNOSIS:  thoracic epidural mets, thoracic instability T3  PROCEDURE:  Procedure(s):T1-5 posterolateral arthrodesis, allograft Thoracic one-Thoracic five segmental pedicle screw fixation(medtronic vertex)  SURGEON:  Surgeon(s): Ashok Pall, MD Newman Pies, MD  ASSISTANTS:Jenkins, Jeffreyy  ANESTHESIA:   general  EBL:     BLOOD ADMINISTERED:none  CELL SAVER GIVEN:none COUNT:per nursing  DRAINS: none   SPECIMEN:  No Specimen  DICTATION: Sarah [name] is a 48 y.o. female Whom I took to the operating room for an emergent decompression of epidural tumor causing spinal cord compression. The tumor had essentially replaced T3, and I found intraop a very vascular lesion. Thus I did the decompression and deferred the stabilization. She was brought to the operating room today for a posterolateral arthrodesis and pedicle screw fixation. [name] was brought to the operating room, intubated, and placed under a general anesthetic without difficulty. She was positioned prone on the Justice table with all pressure points padded. Her back was prepped and draped in a sterile manner. I opened and extended the previous incision both rostrally and caudally. I used fluoroscopy to localize. I exposed the lamina of T1,2,3,4,and 5 bilaterally. I then exposed the pedicles at each level.  In the same manner that is described I placed pedicle screws at T1,2,4, and 5 bilaterally. I used the medtronic vertex system.  I first used fluoroscopy to identify the pedilce, I then drilled through the cortex at eAch pedicle site. I used a small pedicle sound to gain entry to the pedicle and vertebral body at each level. I tapped each hole , used a small ball probe to assess the integrity of each screw site, and detected no cortical breaches at  each site. I placed the screws into each pedicle for a total of 8 screws.  Dr. Arnoldo Morale and I decorticated the lamina of T1,2,4, and 5 along with the facets and lateral bone. We placed allograft on eAch side from T1-5 without difficulty to complete the arthrodesis. We moved on to the rod placement. We placed two rods through the screw heads and secured them with locking caps. We torqued each cap to the appropriate strength, then attached a cross connector. We had good hemostasis. I irrigated the wound. We closed the wound in layers approximating the thoracolumbar, subcutaneous, and subcuticular layers with vicryl sutures. I approximated the skin edges with a nylon suture. An occlusive dressing wAs placed. She was then rolled onto the hospital bed and extubated.  PLAN OF CARE: Admit to inpatient   PATIENT DISPOSITION:  PACU - hemodynamically stable.   Delay start of Pharmacological VTE agent (>24hrs) due to surgical blood loss or risk of bleeding:  yes

## 2013-12-30 NOTE — Anesthesia Preprocedure Evaluation (Addendum)
Anesthesia Evaluation  Patient identified by MRN, date of birth, ID band Patient awake    Reviewed: Allergy & Precautions, H&P , NPO status , Patient's Chart, lab work & pertinent test results  Airway Mallampati: II TM Distance: >3 FB Neck ROM: Limited    Dental  (+) Teeth Intact, Dental Advisory Given   Pulmonary asthma , PE breath sounds clear to auscultation        Cardiovascular hypertension, Pt. on medications Rhythm:Regular Rate:Normal     Neuro/Psych    GI/Hepatic   Endo/Other  Morbid obesity  Renal/GU      Musculoskeletal   Abdominal   Peds  Hematology   Anesthesia Other Findings   Reproductive/Obstetrics                          Anesthesia Physical Anesthesia Plan  ASA: IV  Anesthesia Plan: General   Post-op Pain Management:    Induction: Intravenous  Airway Management Planned: Oral ETT and Video Laryngoscope Planned  Additional Equipment: CVP and Ultrasound Guidance Line Placement  Intra-op Plan:   Post-operative Plan: Extubation in OR  Informed Consent: I have reviewed the patients History and Physical, chart, labs and discussed the procedure including the risks, benefits and alternatives for the proposed anesthesia with the patient or authorized representative who has indicated his/her understanding and acceptance.   Dental advisory given  Plan Discussed with: CRNA, Anesthesiologist and Surgeon  Anesthesia Plan Comments:         Anesthesia Quick Evaluation

## 2013-12-30 NOTE — Progress Notes (Signed)
ANTICOAGULATION CONSULT NOTE - Follow Up Consult  Pharmacy Consult:  Heparin Indication: pulmonary embolus  Allergies  Allergen Reactions  . Tramadol Nausea And Vomiting    Patient Measurements: Height: 5\' 11"  (180.3 cm) Weight: 313 lb 15 oz (142.4 kg) IBW/kg (Calculated) : 70.8 Heparin Dosing Weight: 104 kg  Vital Signs: Temp: 98.4 F (36.9 C) (10/09 0954) Temp Source: Oral (10/09 0954) BP: 118/69 mmHg (10/09 0954) Pulse Rate: 94 (10/09 0954)  Labs:  Recent Labs  12/28/13 0917 12/29/13 0801 12/30/13 0650  HGB 12.3 11.9* 11.5*  HCT 36.7 35.2* 34.4*  PLT 305 323 309  HEPARINUNFRC 0.67 0.69 0.45  CREATININE 0.82  --   --     Estimated Creatinine Clearance: 133.1 ml/min (by C-G formula based on Cr of 0.82).   Assessment: Heparin level is 0.45, therapeutic on IV heparin drip 1600 units/hr in this 48 yo female who is receiving heparin for acute PE.  H/H stable at 11.5/34.4 and pltc 309K. No bleeding noted. O2 sats 100% on RA.    She was admitted on 9/21 with LE weakness and found to have mass at T-3 level with spinal compression. 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  S/p T3 s/p decompression laminectomy & biopsy 9/24. Found to have a PE on CT chest and started on IV heparin 48 hours post-op.  BLE venous duplex 12/15/13 negative for DVT.  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 9/30: Path returned with metastatic adenocarcinoma. MD notes that likely lung as primary, however, a metastatic oncocytic thyroid carcinoma is still in the differential. Endocrinology consulted and recommending surgery consult for thyroid surgery.   Heparin drip is to stop at 12:00 noon today in preparation for  surgery -thoracic stabilization today.   Goal of Therapy:  Heparin level 0.3-0.7 units/ml Monitor platelets by anticoagulation protocol: Yes   Plan:  Heparin at 1600 units/hr -will stop at 12Noon today for surgery.  Will f/u after surger for restart of IV heparin.  Daily heparin level and  CBC Follow-up plans for long-term anticoagulation.  Long-term Lovenox is preferred based on the oncology guidelines. Dose would be 140mg  q12h-  Wt 142kg  Nicole Cella, RPh Clinical Pharmacist Pager: [phone] 12/30/2013 11:11 AM

## 2013-12-30 NOTE — Progress Notes (Signed)
Family Medicine Teaching Service Attending Note  I discussed patient Sarah [name]  with Dr. Alease Frame and reviewed their note for today.  I agree with their assessment and plan.

## 2013-12-30 NOTE — Anesthesia Procedure Notes (Signed)
Procedure Name: Intubation Date/Time: 12/30/2013 3:23 PM Performed by: Susa Loffler Pre-anesthesia Checklist: Patient identified, Timeout performed, Emergency Drugs available, Suction available and Patient being monitored Patient Re-evaluated:Patient Re-evaluated prior to inductionOxygen Delivery Method: Circle system utilized Preoxygenation: Pre-oxygenation with 100% oxygen Intubation Type: IV induction Ventilation: Mask ventilation without difficulty and Oral airway inserted - appropriate to patient size Grade View: Grade I Tube type: Oral Tube size: 7.0 mm Number of attempts: 1 Airway Equipment and Method: Stylet and Video-laryngoscopy Placement Confirmation: ETT inserted through vocal cords under direct vision,  positive ETCO2 and breath sounds checked- equal and bilateral Secured at: 22 cm Tube secured with: Tape Dental Injury: Teeth and Oropharynx as per pre-operative assessment  Difficulty Due To: Difficulty was anticipated

## 2013-12-31 LAB — GLUCOSE, CAPILLARY
GLUCOSE-CAPILLARY: 245 mg/dL — AB (ref 70–99)
GLUCOSE-CAPILLARY: 202 mg/dL — AB (ref 70–99)
Glucose-Capillary: 199 mg/dL — ABNORMAL HIGH (ref 70–99)
Glucose-Capillary: 185 mg/dL — ABNORMAL HIGH (ref 70–99)
Glucose-Capillary: 233 mg/dL — ABNORMAL HIGH (ref 70–99)

## 2013-12-31 LAB — CBC
HEMATOCRIT: 32 % — AB (ref 36.0–46.0)
Hemoglobin: 11.1 g/dL — ABNORMAL LOW (ref 12.0–15.0)
MCH: 29.1 pg (ref 26.0–34.0)
MCHC: 34.7 g/dL (ref 30.0–36.0)
MCV: 83.8 fL (ref 78.0–100.0)
Platelets: 280 10*3/uL (ref 150–400)
RBC: 3.82 MIL/uL — ABNORMAL LOW (ref 3.87–5.11)
RDW: 14.8 % (ref 11.5–15.5)
WBC: 15.5 10*3/uL — AB (ref 4.0–10.5)

## 2013-12-31 NOTE — Progress Notes (Signed)
Family Medicine Teaching Service Daily Progress Note Intern Pager: [phone]  Patient name: Sarah [name] Medical record number: [id number] Date of birth: 1965-04-18 Age: 48 y.o. Gender: female  Primary Care Provider: No PCP Per Patient Consultants: Neurosurgery, IR, Oncology Code Status: Full  Pt Overview and Major Events to Date:  - 9/22: Pt. Admitted with lower extremity weakness.  - 9/23: Pt. Found to have mass at T-3 level with spinal compression.  - 9/24: Surgery performed for decompression and biopsy overnight. Pt. Post-op and doing well. 3cm Suprahilar mass on CT Chest, PE also identified. ?Thyroid etiology, but much less likely for mets. Oncology consulted.  - 9/25 - 9/29: Awaiting pathology results with plan for oncology to see once path returns. Pt. With little neurological improvement. Started with light PT.  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 Oncology on board. Neurosurgery to comment on need for spinal stabilization surgery.  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 - 10/9: Underwent spinal stabilization surg.  Assessment and Plan: Sarah [name] 48 y.14 female presenting with continued lower extremity weakness at baseline after discharge from hospital on 9/21. PMH is significant for obesity, HTN, and ?History of Hypothyroidism previously on Synthroid per patient.   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  Neurosurgery took her to the OR on 9/23, and biopsied as well as performed laminectomy for decompression. Surgical pathology  with metastatic adenocarcinoma; Lung likely primary. Overall this indicates a likely very poor prognosis. 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  - PT recommendations SNF with 24hour supervision. Will plan dispo based on goals of care discussion.   - Per SW; SNF bed currently available, waiting for med stability; pt currently would rather go  home than to SNF  - Discussed Recs for PT with Neurosurgery. Light PT at this point. Pt. Not to get UOB.  - Pain control:  Oxy IR prn - Foley catheter in place for urinary retention; Monitor for fecal retention - pt. Continues to have BM on her own.   - Neurosurgery following: Dr. CabbeChristella Noaes will take to OR as early as this week.   - Tapered Decadron:  10/3  4mg B47m(began on 9/24 at 6mg Q648m >> DC 10/9 - Oncology following: Goals of care to be discussed with possible outpatient treatment.   # Thyroid; Suggestive of Hurthle Cell Carcinoma.  - Large thyroid goiter noted. TSH normal; ultrasound (10/1) showing diffuse heterogenicity, cannot r/o malignancy, Biopsy on 10/2 - FNA results: "clusters of neoplastic cells w/ granular/oncogenic cytoplasm--similar to previous bx" - Oncology consulted endocrine to schedule thyroidectomy. Appreciate the input. Will follow notes to determine timeline. - We will contact Dr. Gerkin Harlow Asaday (10/12).  #Pulmonary Embolus - Found on CT Chest  Pt. With brief episodes of tachycardia, but otherwise asymptomatic with O2 sats 100% on 2L [place].  Blood pressures stable. Lower extremity Venous Duplex negative for DVT.  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 ESR elevated.  - Now on IV heparin -- Will begin bridging as soon  as possible (post surgical)  - Per pharm--recommendation for long-term Lovenox; I doubt this will be possible, patient is self-pay. If it is possible, Dose would be 186m SQ q12h; will plan to discuss anticoagulation w/ Onc (Dr. MJulien Nordmann  - Will  continue to monitor VS. Stable at this time. - DC heparin due to surgery--will restart 48hrs after surgery (~7PM 10/11)  #Hyperglycemia -  Likely diabetic with PCOS given A1C of 6.7. CBG's 200's - 300.  - Patient informed of elevated A1C and likely diagnosis of DMII. She agrees with uKoreathat this is a marginal issue at this point, and we will recommend treatment based upon our goals of care discussion.  - Continue to monitor.  - Sensitive SSI; 15U Lantus. (consider back down to Lantus 10U tomorrow w/ weaning decadron) - Consider backing off on insulin dose as decadron is tapered down.  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 PCOS vs. Adrenal patholog. Would be unlike to be first presentation of HPA pathology; no evidence of salt wasting on labs. Hypertensive and not hypotensive. Testing during this hospitalization after having recently received steroids would be largely non-diagnostic. Though, could also be a combination of isolated processes i.e. Clitoromegaly, Hirsutism 2/2 obesity. - Recent thought of outpt vs inpt w/up including [place] assess ovaries vs 21-hydroxylase/ACTH/cortisol.  - TSH, Cortisol WNL - A1C : 6.7 - consistent with PCOS given evidence of cystic structures in pelvis.   # UTI, 2/2 foley catheter - UCx = Proteus  - dysuria and suprapubic pain noted PM 10/1. - Cipro 2528mBID (10/2 started) -- treated for 7 days -- DC 10/9.  #HTN- BP fairly well controlled -continue losartan.  - Blood pressure well controlled here.     #Obesity-  - consistent with potential PCOS.   FEN/GI: Reg diet; KVO; s/p Enema with some relief Prophylaxis: on full dose IV heparin  Disposition: Pending Goals of Care discussion. Neurosurgery to address need for surgical spinal stabilization.   Subjective: [name] in bed trying to sleep. She states she is very tired at this time. Mother is at her side. Per mother, she is in  a good amount of pain, did not receive pain medication until shortly prior to me entering. They are both comfortable with how she is feeling and how her care has been managed. She will be transferred to the floor (out of neuro ICU) later today.  Objective: Temp:  [98.1 F (36.7 C)-98.4 F (36.9 C)] 98.1 F (36.7 C) (10/10 0036) Pulse Rate:  [70-103] 97 (10/10 1000) Resp:  [14-33] 18 (10/10 1000) BP: (128-164)/(65-84) 131/71 mmHg (10/10 1000) SpO2:  [99 %-100 %] 99 % (10/10 1000) Arterial Line BP: (91-178)/(72-108) 91/80 mmHg (10/10 0745) Physical Exam: General: NAD, AAOx3, morbidly obese  HEENT: Thyromegaly / Goiter noted on exam without tenderness, and no nodules, no LAD, neck supple; significant adipose tissue at base of posterior neck ("hump") Abdomen: Soft and nontender, mildly distended, No organomegaly noted.  Extremities: no edema,  No calf tenderness, 2+ distal pulses bilaterally.    Skin: No lesions or rashes noted, incision over T-spine C/D/I Neuro: cranial nerves grossly normal. Normal speech; 0/5  Plantarflexion BL feet. 0/5 Hip Flexor / extensor movement. Full; sensation to light touch, possibly L>R. Neuro exam is at new baseline. No new changes. No sensory deficits, and poor motor exam.   Laboratory:  Recent Labs Lab 12/29/13 0801 12/30/13 0650 12/31/13 0544  WBC 11.6*  11.2* 15.5*  HGB 11.9* 11.5* 11.1*  HCT 35.2* 34.4* 32.0*  PLT 323 309 280    Recent Labs Lab 12/27/13 0701 12/28/13 0917  NA 130* 132*  K 4.8 4.5  CL 91* 90*  CO2 26 26  BUN 25* 27*  CREATININE 0.77 0.82  CALCIUM 9.0 8.8  GLUCOSE 239* 241*   Urinalysis    Component Value Date/Time   COLORURINE AMBER* 12/23/2013 1058   APPEARANCEUR TURBID* 12/23/2013 1058   LABSPEC 1.018 12/23/2013 1058   PHURINE 7.5 12/23/2013 1058   GLUCOSEU NEGATIVE 12/23/2013 1058   HGBUR LARGE* 12/23/2013 1058   BILIRUBINUR NEGATIVE 12/23/2013 1058   KETONESUR NEGATIVE 12/23/2013 1058   PROTEINUR 100* 12/23/2013 1058    UROBILINOGEN 0.2 12/23/2013 1058   NITRITE NEGATIVE 12/23/2013 1058   LEUKOCYTESUR LARGE* 12/23/2013 1058    Phos - 3.9 Mg - 2.2 CRP - 11.6 ESR - 59 CK - 56 B12 - 209 Random Cortisol - 25.4 A1C - 6.7 TSH - 2.13 RPR - nonreactive HIV - nonreactive UPT - Negative  Imaging/Diagnostic Tests:  CT Chest Abd/ Pelvis 9/23:  CLINICAL DATA: Destructive metastatic lesion of the T3 vertebral  body with no known primary carcinoma.  EXAM:  CT CHEST, ABDOMEN, AND PELVIS WITH CONTRAST  TECHNIQUE:  Multidetector CT imaging of the chest, abdomen and pelvis was  performed following the standard protocol during bolus  administration of intravenous contrast.  CONTRAST: 136m OMNIPAQUE IOHEXOL 300 MG/ML SOLN  COMPARISON: MRI of the cervical spine earlier today.  FINDINGS:  CT CHEST FINDINGS  Within the chest, a right suprahilar mass measures approximately 3.0  x 2.7 x 2.2 cm. This either represents a nodal metastasis or central  lung mass such as small cell carcinoma. A number of smaller  mediastinal lymph nodes are also identified in the right  paratracheal region, prevascular space and AP window.  Destructive lesion of the T3 vertebral body noted which nearly  replaces the entire vertebral body with tumor extension posteriorly  into the epidural space causing significant compression of the  spinal cord. Paravertebral extension of tumor is also suspected  lateral and anterior to the vertebral body, right greater than left.  Massive enlargement of the thyroid gland present with transverse  dimensions of approximately 7.7 x 8.5 cm. The thyroid gland is  incompletely visualized as the neck was not included. Central  dystrophic calcification identified. The enlarged thyroid gland does  cause significant mass effect on the airway with evidence of  tracheal deviation to the right and tracheal narrowing at the  thoracic inlet. The enlarged thyroid gland extends into the superior  mediastinum.   There is evidence of pulmonary embolism with nonocclusive thrombus  identified in the right middle lobe and lower lobe pulmonary  arteries. Pulmonary arterial opacification is not optimal on the  standard venous phase evaluation of the chest but the right sided  pulmonary embolism is clearly present.  There are small bilateral pulmonary nodules. Right-sided pulmonary  nodule measuring 6 mm likely localizes to the very top of the lower  lobe near the major fissure. Left upper lobe nodule measures 7 mm.  Lingular nodule measures 7 mm. Left lower lobe nodule measures 7 mm.  Faint peripheral right middle lobe nodule measures 3 mm. Inferior  right middle lobe nodule measures 6 mm. Several small nodules in the  posterior right upper lobe identified in the 3-4 mm range.  No evidence of pleural or pericardial fluid. No other bony lesions  identified in the  thorax.  CT ABDOMEN AND PELVIS FINDINGS  Small low-density lesion in the left lobe of the liver measures 7 mm  and likely represents a cyst. No other hepatic abnormalities. The  gallbladder, pancreas, spleen, adrenal glands and kidneys are  unremarkable. No adenopathy is identified in the abdomen or pelvis.  Bowel shows normal caliber without evidence of obstruction, lesion  or inflammation. No abnormal fluid collections.  The bladder is moderately distended. Elongated cystic changes in  both adnexal regions identified which may represent a combination of  ovarian cysts and potentially hydrosalpinx. Correlation with pelvic  ultrasound is recommended. No hernias are identified. The uterus  appears unremarkable by CT. No bony lesions are identified in the  abdomen or pelvis.  IMPRESSION:  1. 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  2. 3 cm right suprahilar mass may represent nodal metastasis versus  primary  central tumor. Next number multiple small bilateral  pulmonary nodules consistent with metastatic disease.  3. Acute nonocclusive pulmonary embolism in the right middle lobe  and right lower lobe pulmonary arteries.  4. Massive thyroid goiter causing tracheal deviation to the right  and narrowing of the trachea.  5. Tubular cystic abnormalities of both adnexal regions which may be  a combination of cystic changes in the ovaries and potentially  hydrosalpinx. Correlation recommended with pelvic ultrasound to  exclude any suspicious cystic lesions of the adnexae.  6. Critical Value/emergent results were called by telephone at the  time of interpretation on 12/14/2013 at 4:52 pm to Dr. Tamala Julian of the  [facility] Medicine Teaching Service, who verbally acknowledged these  results.  Electronically Signed  By: Aletta Edouard M.D.  On: 12/14/2013 16:53   MRI T-spine 9/23:  CLINICAL DATA: Lower extremity weakness. Worsening back pain.  Compressive upper thoracic mass partially visualized on cervical  spine MRI as well as chest CT with spinal cord compression.  EXAM:  MRI THORACIC SPINE WITHOUT CONTRAST  TECHNIQUE:  Multiplanar, multisequence MR imaging of the thoracic spine was  performed. No intravenous contrast was administered.  COMPARISON: Cervical spine MRI and chest CT earlier today  FINDINGS:  There is a T3 compression fracture with approximately 50% height  loss centrally. As described on cervical spine MRI and chest CT  earlier today, there is abnormal soft tissue replacing the majority  of the T3 vertebral body extending into the pedicles as well as into  the ventral epidural space with moderate cord compression. Epidural  extension is slightly greater on the right and extends into the  inferior aspect of the right T2-3 neural foramen as well as superior  aspect of the right T3-4 neural foramen. There is also abnormal  paravertebral soft tissue laterally and anteriorly as seen on CT.   Central ventral epidural material posterior to the T4 vertebral body  may represent tumor or hematoma. Mild T2 hyperintensity is present  in the spinal cord at the T3 level extending inferiorly to the T4-5  disc space level consistent with mild edema. Mild edema is present  in the posterior paraspinal soft tissues of the upper thoracic  spine. No other vertebral body lesions are identified.  There is a moderate-sized central disc extrusion at T7-8 which  contacts and mildly flattens the ventral spinal cord with mild  spinal stenosis. A small left central disc protrusion is present at  T8-9 with at most minimal impression on the spinal  cord. Massive  enlargement of the left thyroid lobe is partially visualized.  Mediastinal and lung findings are better demonstrated on recent  chest CT.  IMPRESSION:  1. T3 vertebral body mass with pathologic compression fracture and  epidural tumor resulting in moderate cord compression with evidence  of mild spinal cord edema.  2. T7-8 disc extrusion resulting in mild spinal stenosis.   MRI Head / C-Spine 9/23:  CLINICAL DATA: 48 year old female who presented with back pain and  lower extremity numbness, with continued inadequately explained  lower extremity weakness following Hospital discharge on 12/12/2013.  Initial encounter.  EXAM:  MRI HEAD WITHOUT AND WITH CONTRAST  MRI CERVICAL SPINE WITHOUT AND WITH CONTRAST  TECHNIQUE:  Multiplanar, multiecho pulse sequences of the brain and surrounding  structures, and cervical spine, to include the craniocervical  junction and cervicothoracic junction, were obtained without and  with intravenous contrast.  CONTRAST: 20 mL MultiHance.  COMPARISON: Lumbar MRI 12/10/2013.  FINDINGS:  MRI HEAD FINDINGS  Cerebral volume is normal. No restricted diffusion to suggest acute  infarction. No midline shift, mass effect, evidence of mass lesion,  ventriculomegaly, extra-axial collection or acute intracranial   hemorrhage. Cervicomedullary junction and pituitary are within  normal limits. Major intracranial vascular flow voids are preserved.  Pearline Cables and white matter signal is within normal limits for age  throughout the brain. No abnormal enhancement identified, pulsation  artifact prominent on axial post-contrast images.  Large body habitus. Small right frontal scalp lipoma (series 10,  image 37). Visible internal auditory structures appear normal. Mild  right mastoid fluid. Minor paranasal sinus mucosal thickening.  Visualized orbit soft tissues are within normal limits. Partially  visible thyromegaly greater on the left, see below. Normal bone  marrow signal.  MRI CERVICAL SPINE FINDINGS  Large body habitus resulting in wrap artifact on axial images.  However, there is massive thyroid enlargement evident, with the left  lobe measuring at least 7.2 x 5.2 x 11.2 cm (AP by transverse by  CC). The trachea is displaced to the right at the thoracic inlet and  mildly compressed. There is other regional mass effect including  sheath. On the left carotid  Fairly preserved cervical lordosis. No marrow edema or evidence of  acute osseous abnormality. Cervicomedullary junction is within  normal limits. Spinal cord signal is within normal limits at all  visualized levels. No abnormal intradural enhancement.  C2-C3: Negative.  C3-C4: Disc osteophyte complex, facet and ligament flavum  hypertrophy. Along with a degree of congenital canal narrowing,  there is mild spinal stenosis and moderate biforaminal stenosis.  C4-C5: Mild uncovertebral and facet hypertrophy. Mild right C5  foraminal stenosis.  C5-C6: Circumferential disc osteophyte complex. Along with a degree  of congenital canal narrowing there is mild spinal stenosis.  Moderate right C6 foraminal stenosis.  C6-C7: Negative except for mild facet hypertrophy.  C7-T1: Negative.  T1-T2: Negative.  Faintly visualized at the T3 level in the upper  thoracic spine there  is a lobulated solidly enhancing mass which appears to extend into  the epidural space from the T3 vertebral body. This is inseparable  from the spinal cord which is likely compressed. See series 2000  image 7 and series 1500, image 6. This is not included on axial  images.  IMPRESSION:  1. Faintly visible but aggressive appearing enhancing mass at the T3  level of the upper thoracic spine, likely with spinal cord  compression evaluate further are with thoracic MRI without and with  contrast.  2.  Normal for age MRI appearance of the brain.  3. Massive thyroid enlargement, greater on the left. Left thyroid  goiter/mass measuring at least 7.2 x 5.2 x 11.2 cm. Mass effect at  the thoracic inlet including on the trachea.  4. Mild chronic disc and endplate degeneration in the cervical spine  at C3-C4 and C5-C6 resulting in borderline to mild spinal stenosis,  but no cervical spinal cord mass effect or signal abnormality.  Electronically Signed:  By: Lars Pinks M.D.  On: 12/14/2013 09:50   MRI L-Spine 9/19:  CLINICAL DATA: Back pain with bilateral leg numbness, worsening  with oral steroids.  EXAM:  MRI LUMBAR SPINE WITHOUT CONTRAST  TECHNIQUE:  Multiplanar, multisequence MR imaging of the lumbar spine was  performed. No intravenous contrast was administered.  COMPARISON: CT 12/09/2013  FINDINGS:  There is no degenerative disease at L2-3 or above. The discs are  normal in signal characteristics and morphology. The spinal canal  and foramina are widely patent. The distal cord and conus are normal  with conus tip at L1. There are small benign and insignificant  hemangiomas within the L2 and L3 vertebral bodies.  L3-4: Minimal desiccation of the disc. Mild circumferential bulging  slightly more prominent towards the left. Bilateral facet  degeneration and hypertrophy. Mild narrowing of the lateral recesses  without apparent neural compression. Mild foraminal  narrowing on the  left without apparent neural compression.  L4-5: Desiccation of the disc with circumferential protrusion.  Bilateral facet and ligamentous hypertrophy. Stenosis of both  lateral recesses without definite focal neural compression. Mild  foraminal narrowing bilaterally.  L5-S1: Desiccation of the disc with loss of height. Endplate  osteophytes and bulging of the disc more prominent towards the left.  Bilateral facet degeneration and hypertrophy left more than right.  Stenosis of the subarticular lateral recess on the left that could  compress the S1 nerve root. Mild foraminal narrowing left more than  right but without apparent compression of the exiting L5 nerve  roots.  Serpiginous fluid-filled structure in the left pelvis could  represent hydrosalpinx. This is not primarily or completely imaged.  IMPRESSION:  Degenerative disc disease and degenerative facet disease in the  lower lumbar spine that could be a cause of lumbago.  Lateral recess and foraminal narrowing at L3-4, L4-5 and L5-S1 as  described above that could be a cause of focal neural compression.  In general, this is more pronounced on the left than on the right.  In particular, there is potential for left S1 nerve root compression  in the subarticular lateral recess at L5-S1.  Electronically Signed  By: Nelson Chimes M.D.  On: 12/10/2013 08:45   CT L-Spine 9/18:  CLINICAL DATA: Low back pain with difficulty walking.  EXAM:  CT LUMBAR SPINE WITHOUT CONTRAST  TECHNIQUE:  Multidetector CT imaging of the lumbar spine was performed without  intravenous contrast administration. Multiplanar CT image  reconstructions were also generated.  COMPARISON: None.  FINDINGS:  There is no fracture or spondylolisthesis. There is moderately  severe disc space narrowing at L5-S1 with slight vacuum phenomenon  at this level. There is moderate narrowing at L4-5. There are  prominent anterior osteophytes at L2, L3, L4,  and L5.  At T12-L1, there is moderate facet hypertrophy bilaterally. There is  no nerve root edema or effacement. No disc extrusion or stenosis.  At L1-2, there is moderate facet hypertrophy bilaterally. There is  mild diffuse disc bulging. There is no nerve root edema or  effacement.  No disc extrusion or stenosis.  At L2-3, there is broad-based disc bulging and moderate facet  hypertrophy bilaterally. There is borderline narrowing of the thecal  sac with borderline spinal stenosis. No disc extrusion.  At L3-4, there is severe facet osteoarthritic change bilaterally,  more pronounced on the left than on the right. There is broad-based  disc bulging and moderate ligamentum flavum hypertrophy. These  findings in concert lead to moderate generalized spinal stenosis.  At L4-5, there is broad-based disc protrusion and severe facet  osteoarthritic change bilaterally. These findings in concert lead to  moderate generalized spinal stenosis. No frank disc extrusion.  At L5-S1, there is severe facet osteoarthritic change on the left  and moderate facet osteoarthritic change on the right. There is  broad-based disc protrusion. There is exit foraminal narrowing  bilaterally, more severe on the left than on the right, due to  osteophytic change. No frank disc extrusion or stenosis is seen at  this level.  There are no paraspinous lesions.  IMPRESSION:  Spinal stenosis at L3-4 and L4-5, multifactorial. Borderline spinal  stenosis at L2-3. There does appear to be a degree of congenital  narrowing of the canal which contributes to spinal stenosis.  Multilevel arthropathy.  No frank disc extrusion.  No fracture or spondylolisthesis.  Electronically Signed  By: Lowella Grip M.D.  On: 12/09/2013 13:50   CXR 9/13:  CLINICAL DATA: Upper back pain for 5 weeks. Dyspnea on exertion.  Nonsmoker. History of hypertension.  EXAM:  CHEST 2 VIEW  COMPARISON: 04/15/2006  FINDINGS:  The heart is mildly  enlarged. There is elevation of right  hemidiaphragm, stable in appearance. There is streaky density  favored to be within the right lower lobe, consistent with  atelectasis or infiltrate. No pulmonary edema. No pleural effusions.  There are moderate degenerative changes in the thoracic spine.  IMPRESSION:  Right lower lobe atelectasis or infiltrate.  Electronically Signed  By: Shon Hale M.D.  On: 12/04/2013 19:26  Venous Duplex 9/24:  Summary:  - No evidence of deep vein thrombosis involving the right lower extremity. - No evidence of deep vein thrombosis involving the left lower extremity.   Surgical Pathology Report: 12/21/2013 Diagnosis Soft tissue mass, simple excision, Thoracic Epidural Tumor - METASTATIC CARCINOMA. PLEASE SEE COMMENT. Microscopic Comment There are clusters of malignant cells with abundant eosinophilic cytoplasm. A battery of immunostains were performed and the tumor cells show the following profile: Cytokeratin 7 - strongly positive Cytokeratin 8/18 - strongly positive TTF-1 - strongly positive. Thyroglobulin - negative CD10 - negative Cytokeratin 20 - negative S100 - negative Vimentin - negative The control stained appropriately. The overall findings are diagnostic for metastatic carcinoma. 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 However, a metastatic oncocytic thyroid carcinoma is still in the differential. Clinical and radiographic correlation is highly recommended. (HCL:kh 12-19-13) Aldona Bar MD Pathologist, Electronic Signature (Case signed 12/20/2013)  Elberta Leatherwood, MD 12/31/2013, 10:42 AM PGY-1, [facility] Intern pager: [phone], text pages welcome

## 2013-12-31 NOTE — Progress Notes (Signed)
Patient ID: Sarah [name], female   DOB: 1965-12-01, 48 y.o.   MRN: [id number] Subjective:  The patient is alert and pleasant. Her back is appropriately sore. She wants the A-line out  Objective: Vital signs in last 24 hours: Temp:  [98.1 F (36.7 C)-98.6 F (37 C)] 98.1 F (36.7 C) (10/10 0036) Pulse Rate:  [70-97] 93 (10/10 0400) Resp:  [14-23] 17 (10/10 0400) BP: (118-164)/(65-79) 146/79 mmHg (10/10 0400) SpO2:  [99 %-100 %] 99 % (10/10 0400) Arterial Line BP: (124-178)/(72-108) 124/103 mmHg (10/10 0400)  Intake/Output from previous day: 10/09 0701 - 10/10 0700 In: 1780 [I.V.:1780] Out: 1600 [Urine:1300; Blood:300] Intake/Output this shift: Total I/O In: 80 [I.V.:80] Out: 1125 [Urine:1125]  Physical exam the patient is alert and pleasant. She remains paraplegic.  Lab Results:  Recent Labs  12/29/13 0801 12/30/13 0650  WBC 11.6* 11.2*  HGB 11.9* 11.5*  HCT 35.2* 34.4*  PLT 323 309   BMET  Recent Labs  12/28/13 0917  NA 132*  K 4.5  CL 90*  CO2 26  GLUCOSE 241*  BUN 27*  CREATININE 0.82  CALCIUM 8.8    Studies/Results: Dg Thoracic Spine 2 View  12/30/2013   CLINICAL DATA:  T1 through T5 posterior fusion.  Initial evaluation.  EXAM: THORACIC SPINE - 2 VIEW  COMPARISON:  Thoracic spine MRI 12/14/2013.  FINDINGS: Intraoperative portable AP view upper thoracic spine. Pedicle screws are noted it was most likely T1 and T2 as well as T4 and T5.  IMPRESSION: Thoracic pedicle screws noted in this single AP portable intraoperative view.   Electronically Signed   By: [place]   On: 12/30/2013 18:30   Dg Chest Port 1 View  12/30/2013   CLINICAL DATA:  Central line placement.  EXAM: PORTABLE CHEST - 1 VIEW  COMPARISON:  CT chest 12/14/2013.  FINDINGS: Right IJ line noted with its tip projected over superior vena cava. Cardiomegaly. Mild pulmonary venous congestion interstitial prominence noted suggesting congestive heart failure. No pleural effusion or  pneumothorax. Prior T1 through T5 fusion. No acute bony abnormality.  IMPRESSION: 1. Right IJ line noted with its tip projected over superior vena cava . 2. Congestive heart failure with interstitial edema. 3. Prior T1 through T5 thoracic spine fusion.   Electronically Signed   By: Marcello Moores  Register   On: 12/30/2013 20:24   Dg C-arm 1-60 Min  12/30/2013   CLINICAL DATA:  T1 through T5 fusion.  EXAM: DG C-ARM 61-120 MIN  TECHNIQUE: Intraoperative AP view upper thoracic spine obtained.  CONTRAST:  None.  FLUOROSCOPY TIME:  2 min 21 seconds.  COMPARISON:  None.  FINDINGS: Pedicle screws are noted what are most likely T1 and T2 and T4 and T5.  IMPRESSION: Pedicle screws are noted in the lower most likely T1 and T2 and T4 and T5.   Electronically Signed   By: Marcello Moores  Register   On: 12/30/2013 18:31    Assessment/Plan: Postop day 1: I will transfer the patient to the floor. She will likely need rehabilitation  LOS: 19 days     Sarah [name] D 12/31/2013, 6:08 AM

## 2013-12-31 NOTE — Progress Notes (Signed)
PT Cancellation Note  Patient Details Name: Sarah [name] MRN: [id number] DOB: 1965-09-07   Cancelled Treatment:    Reason Eval/Treat Not Completed: Patient not medically ready;Patient declined, no reason specified. Pt refusing at this time due to pain. Also, will need clarification on activity orders. Family medicine note stated "limited PT", RN made aware and will await clarification.    Elie Confer [place], [place] 12/31/2013, 2:56 PM

## 2013-12-31 NOTE — Progress Notes (Signed)
Family Medicine Teaching Service Attending Note  I discussed patient Sarah [name]  with Dr. Alease Frame and reviewed their note for today.  I agree with their assessment and plan.

## 2014-01-01 LAB — GLUCOSE, CAPILLARY
GLUCOSE-CAPILLARY: 190 mg/dL — AB (ref 70–99)
GLUCOSE-CAPILLARY: 241 mg/dL — AB (ref 70–99)
Glucose-Capillary: 187 mg/dL — ABNORMAL HIGH (ref 70–99)
Glucose-Capillary: 216 mg/dL — ABNORMAL HIGH (ref 70–99)

## 2014-01-01 LAB — CBC
HEMATOCRIT: 30.9 % — AB (ref 36.0–46.0)
HEMOGLOBIN: 10.4 g/dL — AB (ref 12.0–15.0)
MCH: 29.1 pg (ref 26.0–34.0)
MCHC: 33.7 g/dL (ref 30.0–36.0)
MCV: 86.6 fL (ref 78.0–100.0)
Platelets: 211 10*3/uL (ref 150–400)
RBC: 3.57 MIL/uL — ABNORMAL LOW (ref 3.87–5.11)
RDW: 15.1 % (ref 11.5–15.5)
WBC: 11 10*3/uL — AB (ref 4.0–10.5)

## 2014-01-01 MED ORDER — SODIUM CHLORIDE 0.9 % IJ SOLN
10.0000 mL | Freq: Two times a day (BID) | INTRAMUSCULAR | Status: DC
  Administered 2014-01-01 – 2014-01-06 (×6): 10 mL
  Administered 2014-01-07: 20 mL
  Administered 2014-01-07 – 2014-01-16 (×11): 10 mL

## 2014-01-01 MED ORDER — HEPARIN (PORCINE) IN NACL 100-0.45 UNIT/ML-% IJ SOLN: 1600.0000 [IU]/h | INTRAMUSCULAR | Status: DC

## 2014-01-01 MED ORDER — ALTEPLASE 2 MG IJ SOLR
2.0000 mg | Freq: Once | INTRAMUSCULAR | Status: DC
  Filled 2014-01-01: qty 2

## 2014-01-01 MED ORDER — HEPARIN (PORCINE) IN NACL 100-0.45 UNIT/ML-% IJ SOLN
2300.0000 [IU]/h | INTRAMUSCULAR | Status: AC
  Administered 2014-01-01: 1600 [IU]/h via INTRAVENOUS
  Administered 2014-01-02 – 2014-01-03 (×3): 1900 [IU]/h via INTRAVENOUS
  Administered 2014-01-03: 3100 [IU]/h via INTRAVENOUS
  Administered 2014-01-04: 2500 [IU]/h via INTRAVENOUS
  Administered 2014-01-04: 2300 [IU]/h via INTRAVENOUS
  Filled 2014-01-01 (×6): qty 250

## 2014-01-01 NOTE — Plan of Care (Signed)
Problem: Acute Rehab PT Goals(only PT should resolve) Goal: PT Additional Goal #2 Tolerate OOB and into chair for 30 min, increased activity tolerance to for pulmonary hygiene

## 2014-01-01 NOTE — Progress Notes (Signed)
Occupational Therapy Evaluation Patient Details Name: Sarah [name] MRN: [id number] DOB: 08-18-1965 Today's Date: 01/01/2014    History of Present Illness 48 y.o. female admitted to [facility] on 12/12/13 with progressive lower extremity weakness and mid to upper back pain.  Pt was found to have a mass at T3 with (+) spinal cord compression.  On 9/23 pt underwent surgery for decompression and biopsy.  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  Her thyroid is very enlarged.  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 Pt is NOW S/P T1-T5 postlat arthrodesis allograft, T1-T5 segmental pedicle screw fixation on 12/30/13.  New therapy orders with updated activity orders written on 12/30/13.   Clinical Impression   Pt seen for re-evaluation s/p spinal stabilization surgery. Pt is very motivated to work with therapy and return to independence, however is limited by decreased sensation/proprioception in LEs as well UE weakness and pain. Pt tolerating 45* and eating dinner at this time. Encouraged pt to slowly increase HOB to tolerance to work towards goal of sitting OOB.     Follow Up Recommendations  CIR;Supervision/Assistance - 24 hour    Equipment Recommendations  Other (comment) (TBD based on d/c)    Recommendations for Other Services       Precautions / Restrictions Precautions Precautions: Fall Precaution Comments: bil leg weakness, paraplegia Restrictions Weight Bearing Restrictions: No      Mobility Bed Mobility Overal bed mobility: +2 for physical assistance;Needs Assistance Bed Mobility: Rolling Rolling: Max assist         General bed mobility comments: pt able to assist minimally with UEs on bedrails. HOB currently at 45* and encouraged to continue to increase slowly as tolerated.    Transfers                 General transfer comment: will require lift at this time         ADL Overall  ADL's : Needs assistance/impaired Eating/Feeding: Set up;Supervision/ safety;Bed level Eating/Feeding Details (indicate cue type and reason): pt reports difficulty drinking from cups with short straws. Provided pt with tubing into ice container to increase independence with drinking.  Grooming: Set up;Bed level   Upper Body Bathing: Minimal assitance;Bed level                             General ADL Comments: Pt currently bed bound due to hx and presents with paraplegia. Pt is eager to progress with independence and sitting tolerance. Pt reported difficulty drinking from short straws and provided pt with tubing for increased independence.      Vision   Pt reports no change from baseline. No apparent deficits.                     Perception Perception Perception Tested?: No   Praxis Praxis Praxis tested?: Within functional limits    Pertinent Vitals/Pain Pain Assessment: 0-10 Pain Score: 5  Pain Location: surgical site Pain Descriptors / Indicators: Discomfort Pain Intervention(s): Monitored during session;Repositioned;Limited activity within patient's tolerance     Hand Dominance Right   Extremity/Trunk Assessment Upper Extremity Assessment Upper Extremity Assessment: Generalized weakness   Lower Extremity Assessment Lower Extremity Assessment: Defer to PT evaluation RLE Deficits / Details: sensation intact with light touching; pt limited in response to pain and decreased proprioception; unable to wiggle toes; reflexive response  to babinskis sign; low tone noted bil laterally RLE Sensation: decreased proprioception RLE Coordination: decreased fine motor;decreased gross motor LLE Deficits / Details: sensation intact with light touching; pt limited in response to pain and decreased proprioception; unable to wiggle toes; reflexive response to babinskis sign; low tone noted bil laterally LLE Sensation: decreased proprioception LLE Coordination: decreased gross  motor;decreased fine motor   Cervical / Trunk Assessment Cervical / Trunk Assessment: Kyphotic   Communication Communication Communication: No difficulties   Cognition Arousal/Alertness: Awake/alert Behavior During Therapy: WFL for tasks assessed/performed Overall Cognitive Status: Within Functional Limits for tasks assessed                        Exercises Exercises: Other exercises Other Exercises Other Exercises: Provided pt with theraband (level 1, yellow) and exercises to increase UE strength in triceps, shoulders, and upper back within tolerance. Provided HEP with written instructions for exercises, including scapular exercises (elevation, depression, retraction) as pt has been in bed for a long time.  Other Exercises: Reinforced education pt on importance of cervical rotation to Rt; pt with Lt gaze preference and ROM restricion to [place] expects to be discharged to:: Private residence Living Arrangements: Other relatives Available Help at Discharge: Family;Available 24 hours/day Type of Home: House Home Access: Stairs to enter [employer] of Steps: 2 (deep enough for all 4 legs of RW) Entrance Stairs-Rails: None Home Layout: One level     Bathroom Shower/Tub: Tub/shower unit Shower/tub characteristics: Curtain Biochemist, clinical: Standard     Home Equipment: Environmental consultant - 2 wheels          Prior Functioning/Environment Level of Independence: Independent        Comments: Pt works at [employer].     OT Diagnosis: Generalized weakness;Acute pain;Other (comment) (paraplegia)   OT Problem List: Decreased strength;Decreased range of motion;Decreased activity tolerance;Impaired balance (sitting and/or standing);Decreased safety awareness;Impaired sensation;Obesity;Pain   OT Treatment/Interventions: Self-care/ADL training;Therapeutic exercise;Energy conservation;DME and/or AE instruction;Therapeutic  activities;Patient/family education;Balance training    OT Goals(Current goals can be found in the care plan section) Acute Rehab OT Goals Patient Stated Goal: to get out of bed this week OT Goal Formulation: With patient Time For Goal Achievement: 01/15/14 Potential to Achieve Goals: Good  OT Frequency: Min 2X/week              End of Session Equipment Utilized During Treatment: Other (comment) (theraband (level 1, yellow))  Activity Tolerance: Patient tolerated treatment well Patient left: in bed;with call bell/phone within reach;with family/visitor present   Time: 1637-1700 OT Time Calculation (min): 23 min Charges:  OT General Charges $OT Visit: 1 Procedure OT Evaluation $Initial OT Evaluation Tier I: 1 Procedure OT Treatments $Self Care/Home Management : 8-22 mins  Villa Herb M 01/01/2014, 5:55 PM   Cyndie Chime, OTR/L Occupational Therapist [phone] (pager)

## 2014-01-01 NOTE — Progress Notes (Signed)
Family Medicine Teaching Service Attending Note  I interviewed and examined patient Sarah [name] and reviewed their tests and x-rays.  I discussed with Dr. Gerarda Fraction and reviewed their note for today.  I agree with their assessment and plan.     Additionally  In good spirits For evaluation for thryroid surgery

## 2014-01-01 NOTE — Progress Notes (Signed)
Patient ID: Sarah [name], female   DOB: 1965-05-12, 48 y.o.   MRN: [id number] Subjective:  the patient is alert and pleasant. She has no complaints other than soreness.  Objective: Vital signs in last 24 hours: Temp:  [98.2 F (36.8 C)-98.9 F (37.2 C)] 98.4 F (36.9 C) (10/11 0930) Pulse Rate:  [96-116] 100 (10/11 0930) Resp:  [18-26] 20 (10/11 0930) BP: (118-145)/(68-76) 133/68 mmHg (10/11 0930) SpO2:  [95 %-99 %] 97 % (10/11 0930)  Intake/Output from previous day: 10/10 0701 - 10/11 0700 In: 460 [P.O.:360; I.V.:100] Out: 1025 [Urine:1025] Intake/Output this shift:    Physical exam the patient is paraplegic.  Lab Results:  Recent Labs  12/31/13 0544 01/01/14 0500  WBC 15.5* 11.0*  HGB 11.1* 10.4*  HCT 32.0* 30.9*  PLT 280 211   BMET No results found for this basename: NA, K, CL, CO2, GLUCOSE, BUN, CREATININE, CALCIUM,  in the last 72 hours  Studies/Results: Dg Thoracic Spine 2 View  12/30/2013   CLINICAL DATA:  T1 through T5 posterior fusion.  Initial evaluation.  EXAM: THORACIC SPINE - 2 VIEW  COMPARISON:  Thoracic spine MRI 12/14/2013.  FINDINGS: Intraoperative portable AP view upper thoracic spine. Pedicle screws are noted it was most likely T1 and T2 as well as T4 and T5.  IMPRESSION: Thoracic pedicle screws noted in this single AP portable intraoperative view.   Electronically Signed   By: [place]   On: 12/30/2013 18:30   Dg Chest Port 1 View  12/30/2013   CLINICAL DATA:  Central line placement.  EXAM: PORTABLE CHEST - 1 VIEW  COMPARISON:  CT chest 12/14/2013.  FINDINGS: Right IJ line noted with its tip projected over superior vena cava. Cardiomegaly. Mild pulmonary venous congestion interstitial prominence noted suggesting congestive heart failure. No pleural effusion or pneumothorax. Prior T1 through T5 fusion. No acute bony abnormality.  IMPRESSION: 1. Right IJ line noted with its tip projected over superior vena cava . 2. Congestive heart failure with  interstitial edema. 3. Prior T1 through T5 thoracic spine fusion.   Electronically Signed   By: Marcello Moores  Register   On: 12/30/2013 20:24   Dg C-arm 1-60 Min  12/30/2013   CLINICAL DATA:  T1 through T5 fusion.  EXAM: DG C-ARM 61-120 MIN  TECHNIQUE: Intraoperative AP view upper thoracic spine obtained.  CONTRAST:  None.  FLUOROSCOPY TIME:  2 min 21 seconds.  COMPARISON:  None.  FINDINGS: Pedicle screws are noted what are most likely T1 and T2 and T4 and T5.  IMPRESSION: Pedicle screws are noted in the lower most likely T1 and T2 and T4 and T5.   Electronically Signed   By: Marcello Moores  Register   On: 12/30/2013 18:31    Assessment/Plan: Postop day #2: Burnis Medin continue to mobilize the patient. I've answered all the patient's, and her mother's, questions.   LOS: 20 days     Zoe Creasman D 01/01/2014, 10:18 AM

## 2014-01-01 NOTE — Progress Notes (Addendum)
Physical Therapy Re- Evaluation Patient Details Name: Sarah [name] MRN: [id number] DOB: 07-08-1965 Today's Date: 01/01/2014   History of Present Illness  48 y.o. female admitted to [facility] on 12/12/13 with progressive lower extremity weakness and mid to upper back pain.  Pt was found to have a mass at T3 with (+) spinal cord compression.  On 9/23 pt underwent surgery for decompression and biopsy.  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  Her thyroid is very enlarged.  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 Pt is NOW S/P T1-T5 postlat arthrodesis allograft, T1-T5 segmental pedicle screw fixation on 12/30/13.  New PT eval and treat with up activity orders written on 12/30/13.  Clinical Impression  Pt seen for re-evaluation s/p spinal stabilization surgery, listed above. Pt does report increased sensation to light touch in LEs vs prior to surgery. Pt able to localize light touch on bil LEs today. Pt continues to have decreased proprioception in LEs and decreased pain sensation.Pt very motivated and willing to participate in therapy today. Pt tolerating HOB elevated to 45 degrees at this time. Discussed with pt goal for the week is to tolerate OOB with lift into chair. Pt continues to be a great candidate for CIR. Pt (A) with repositioning in bed and PROM performed on bil LEs this session.     Follow Up Recommendations CIR    Equipment Recommendations  Wheelchair (measurements PT);Wheelchair cushion (measurements PT);Hospital bed;Other (comment);3in1 (PT)    Recommendations for Other Services OT consult;Rehab consult     Precautions / Restrictions Precautions Precautions: Fall Precaution Comments: bil leg weakness, parapelegia Restrictions Weight Bearing Restrictions: No      Mobility  Bed Mobility Overal bed mobility: +2 for physical assistance;Needs Assistance Bed Mobility: Rolling Rolling: Max assist          General bed mobility comments: total (A) for rolling; pt placed in log rolling position; decr ability to use LEs but able to minimally (A) with UEs once positioned on handrails for support; pt unsafe to sit on EOB at this time; will require lift for OOB activities; pt tolerated 35 degrees of head elevation for ~5 min then tolerating 45 degrees at end of session; encouraged to have Etosha Wetherell [place] at 45 or more as tolerated with all meals to increase upright tolerance   Transfers                 General transfer comment: will require lift at this time  Ambulation/Gait                Stairs            Wheelchair Mobility    Modified Rankin (Stroke Patients Only)       Balance                                             Pertinent Vitals/Pain Pain Assessment: 0-10 Pain Score: 6  Pain Location: at surgical site  Pain Descriptors / Indicators: Operative site guarding;Discomfort Pain Intervention(s): Monitored during session;Patient requesting pain meds-RN notified;Repositioned    Home Living Family/patient expects to be discharged to:: Private residence Living Arrangements: Other relatives Available Help at Discharge: Family;Available 24 hours/day Type of Home: House Home Access: Stairs to enter Entrance Stairs-Rails: None Entrance Stairs-Number of Steps:  2 (deep enough for all 4 legs of RW) Home Layout: One level Home Equipment: Walker - 2 wheels      Prior Function Level of Independence: Independent         Comments: Pt works at [employer].      Hand Dominance   Dominant Hand: Right    Extremity/Trunk Assessment   Upper Extremity Assessment: Defer to OT evaluation           Lower Extremity Assessment: RLE deficits/detail;LLE deficits/detail RLE Deficits / Details: sensation intact with light touching; pt limited in response to pain and decreased proprioception; unable to wiggle toes; reflexive response to  babinskis sign; low tone noted bil laterally LLE Deficits / Details: sensation intact with light touching; pt limited in response to pain and decreased proprioception; unable to wiggle toes; reflexive response to babinskis sign; low tone noted bil laterally  Cervical / Trunk Assessment: Kyphotic  Communication   Communication: No difficulties  Cognition Arousal/Alertness: Awake/alert Behavior During Therapy: WFL for tasks assessed/performed Overall Cognitive Status: Within Functional Limits for tasks assessed                      General Comments General comments (skin integrity, edema, etc.): pt at high risk for breakdown and ulcers; would benefit from PRAFOs on ankles, RN made aware; educated pt on importance of pressure relief and to move in bed as much as tolerated and possible     Exercises Other Exercises Other Exercises: PROM performed on bil LEs;  pt unable to determine LE position when PROM performed; pt able to localize light touch on bil LEs today which she reports is different from prior to surgery Other Exercises: upon entering pt feet against foot board; LEs repositioned with pillows at end of session and ankle in neutral position to preserve ROM  Other Exercises: educated pt on importance of cervical rotation to Rt; pt with Lt gaze preference and ROM restricion to Rt       Assessment/Plan    PT Assessment Patient needs continued PT services  PT Diagnosis Difficulty walking;Generalized weakness;Acute pain   PT Problem List Decreased strength;Decreased range of motion;Decreased activity tolerance;Decreased balance;Decreased mobility;Decreased coordination;Decreased knowledge of use of DME;Decreased knowledge of precautions;Impaired sensation;Impaired tone;Obesity;Pain  PT Treatment Interventions Therapeutic activities;Therapeutic exercise;Neuromuscular re-education;Modalities;Functional mobility training;Balance training;Patient/family education;DME instruction;Gait  training;Wheelchair mobility training   PT Goals (Current goals can be found in the Care Plan section) Acute Rehab PT Goals Patient Stated Goal: to get out of bed this week PT Goal Formulation: With patient Time For Goal Achievement: 01/07/14 Potential to Achieve Goals: Fair    Frequency Min 3X/week   Barriers to discharge Decreased caregiver support;Inaccessible home environment STE home    Co-evaluation               End of Session   Activity Tolerance: Patient tolerated treatment well Patient left: in bed;with call bell/phone within reach;Other (comment);with nursing/sitter in room;with family/visitor present (HOB at 61 ) Nurse Communication: Mobility status;Other (comment) (PRAFOs needed to preserve ankle ROM )         Time: 4193-7902 PT Time Calculation (min): 29 min   Charges:   PT Evaluation $PT Re-evaluation: 1 Procedure PT Treatments $Therapeutic Exercise: 8-22 mins $Therapeutic Activity: 8-22 mins   PT G CodesGustavus Bryant, [place]  [phone] 01/01/2014, 4:13 PM

## 2014-01-01 NOTE — Progress Notes (Signed)
Family Medicine Teaching Service Daily Progress Note Intern Pager: [phone]  Patient name: Sarah [name] Medical record number: [id number] Date of birth: 1965-04-04 Age: 48 y.o. Gender: female  Primary Care Provider: No PCP Per Patient Consultants: Neurosurgery, IR, Oncology Code Status: Full  Pt Overview and Major Events to Date:  - 9/22: Pt. Admitted with lower extremity weakness.  - 9/23: Pt. Found to have mass at T-3 level with spinal compression.  - 9/24: Surgery performed for decompression and biopsy overnight. Pt. Post-op and doing well. 3cm Suprahilar mass on CT Chest, PE also identified. ?Thyroid etiology, but much less likely for mets. Oncology consulted.  - 9/25 - 9/29: Awaiting pathology results with plan for oncology to see once path returns. Pt. With little neurological improvement. Started with light PT.  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 Oncology on board. Neurosurgery to comment on need for spinal stabilization surgery.  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 - 10/8: Underwent spinal stabilization surg. -10/10: Restarted heparin   Assessment and Plan: Sarah [name] 48 y.36 female presenting with continued lower extremity weakness at baseline after discharge from hospital on 9/21. PMH is significant for obesity, HTN, and ?History of Hypothyroidism previously on Synthroid per patient.   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  Neurosurgery took her to the OR on 9/23, and biopsied as well as performed laminectomy for  decompression. Surgical pathology with metastatic adenocarcinoma. Overall this indicates a likely very poor prognosis. 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  - PT recommendations SNF with 24hour supervision. Will plan dispo based on goals of care discussion.   - Per SW; SNF bed currently available, waiting for med stability; pt currently would rather go home than to SNF  - Discussed Recs for PT with Neurosurgery. Light PT at this point. Pt. Not to get UOB.  - Pain control:  Oxy IR prn. Discussed the need for patient to stay ahead of the pain - Foley catheter in place for urinary retention; Monitor for fecal retention - pt. Continues to have BM on her own.   - Neurosurgery following: Patient was taken for spinal stabilization 10/8.   - Tapered Decadron:  10/3  4mg B15m(began on 9/24 at 6mg Q666m >> DC 10/9 - Oncology following   # Thyroid; Suggestive of Hurthle Cell Carcinoma.  - Large thyroid goiter noted. TSH normal; ultrasound (10/1) showing diffuse heterogenicity, cannot r/o malignancy, Biopsy on 10/2 - FNA results: "clusters of neoplastic cells w/ granular/oncogenic cytoplasm--similar to previous bx" - Oncology consulted endocrine to schedule thyroidectomy. Appreciate the input. Will follow notes to determine timeline. - We will contact Dr. Gerkin Harlow Asaday (10/12) to see if he wants to do surgery while inpatient.  #Pulmonary Embolus - Found on CT Chest  Pt. With brief episodes of tachycardia, but otherwise asymptomatic with O2 sats 100% on 2L [place].  Blood pressures stable. Lower extremity Venous Duplex negative for DVT.  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 ESR elevated.  - Now on IV heparin --  Will begin bridging as soon as possible (post surgical)  - Per pharm--recommendation for long-term Lovenox; I doubt this will be possible, patient is self-pay. If it is possible, Dose would be 146m SQ q12h; will plan to discuss  anticoagulation w/ Onc (Dr. MJulien Nordmann  - Will continue to monitor VS. Stable at this time. - DC heparin due to surgery--will restart today at ~7PM   #Hyperglycemia -  Likely diabetic with PCOS given A1C of 6.7. CBG's 180's - 200s.  - Patient informed of elevated A1C and likely diagnosis of DMII. She agrees with uKoreathat this is a marginal issue at this point, and we will recommend treatment based upon our goals of care discussion.  - Continue to monitor.  - Sensitive SSI; 15U Lantus.   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 PCOS vs. Adrenal patholog. Would be unlike to be first presentation of HPA pathology; no evidence of salt wasting on labs. Hypertensive and not hypotensive. Testing during this hospitalization after having recently received steroids would be largely non-diagnostic. Though, could also be a combination of isolated processes i.e. Clitoromegaly, Hirsutism 2/2 obesity. - Recent thought of outpt vs inpt w/up including [place] assess ovaries vs 21-hydroxylase/ACTH/cortisol.  - TSH, Cortisol WNL - A1C : 6.7 - consistent with PCOS given evidence of cystic structures in pelvis.   # UTI, 2/2 foley catheter - UCx = Proteus  - dysuria and suprapubic pain noted PM 10/1. - Cipro 2554mBID (10/2 started) -- treated for 7 days -- DC 10/9.  #HTN- BP fairly well controlled -continue losartan.   #Obesity-  - consistent with potential PCOS.   FEN/GI: Reg diet; KVO; s/p Enema with some relief Prophylaxis: on full dose IV heparin, SCDs  Disposition: Pending Goals of Care discussion and endocrinology recommendations. Possible discharge early next week.  Subjective: Patient doing well this morning. She states she is very tired at this time. Mother is at her side. Per mother, she is still in a good amount of pain. She has been telling her daughter to request the medication as needed to stay on top of the pain. We  discussed the course of treatment at this time and suggested that they start thinking of where she will be discharged to home vs SNF.   Objective: Temp:  [98.2 F (36.8 C)-98.9 F (37.2 C)] 98.2 F (36.8 C) (10/11 0633) Pulse Rate:  [95-116] 100 (10/11 0633) Resp:  [17-26] 20 (10/11 0633) BP: (118-145)/(69-77) 145/73 mmHg (10/11 0633) SpO2:  [95 %-100 %] 96 % (10/11 065361Arterial Line BP: (91)/(80) 91/80 mmHg (10/10 0745) Physical Exam: General: NAD, AAOx3, morbidly obese  HEENT: Thyromegaly / Goiter noted on exam without tenderness, and no nodules, no LAD, neck supple; significant adipose tissue at base of posterior neck ("hump"), lipoma/cyst on forehead Cardio: distant heart sounds Lungs: anterior lung sounds CTA without wheezes. Unable to access posterior lung sounds at this time. Abdomen: Soft and nontender, mildly distended, No organomegaly noted.  Extremities: no edema,  No calf tenderness, Skin: No lesions or rashes noted, incision over T-spine C/D/I Neuro: cranial nerves grossly normal. Normal speech; 0/5  Plantarflexion BL feet. 0/5 Hip Flexor / extensor movement. Full; sensation to light touch, possibly L>R. Neuro exam is at new baseline. No new changes. No sensory deficits, and poor motor exam.   Laboratory:  Recent Labs Lab 12/30/13 0650 12/31/13 0544 01/01/14 0500  WBC 11.2* 15.5* 11.0*  HGB 11.5* 11.1*  10.4*  HCT 34.4* 32.0* 30.9*  PLT 309 280 211    Recent Labs Lab 12/27/13 0701 12/28/13 0917  NA 130* 132*  K 4.8 4.5  CL 91* 90*  CO2 26 26  BUN 25* 27*  CREATININE 0.77 0.82  CALCIUM 9.0 8.8  GLUCOSE 239* 241*   Urinalysis    Component Value Date/Time   COLORURINE AMBER* 12/23/2013 1058   APPEARANCEUR TURBID* 12/23/2013 1058   LABSPEC 1.018 12/23/2013 1058   PHURINE 7.5 12/23/2013 1058   GLUCOSEU NEGATIVE 12/23/2013 1058   HGBUR LARGE* 12/23/2013 1058   BILIRUBINUR NEGATIVE 12/23/2013 1058   KETONESUR NEGATIVE 12/23/2013 1058   PROTEINUR 100*  12/23/2013 1058   UROBILINOGEN 0.2 12/23/2013 1058   NITRITE NEGATIVE 12/23/2013 1058   LEUKOCYTESUR LARGE* 12/23/2013 1058    Phos - 3.9 Mg - 2.2 CRP - 11.6 ESR - 59 CK - 56 B12 - 209 Random Cortisol - 25.4 A1C - 6.7 TSH - 2.13 RPR - nonreactive HIV - nonreactive UPT - Negative  Imaging/Diagnostic Tests:  CT Chest Abd/ Pelvis 9/23:  CLINICAL DATA: Destructive metastatic lesion of the T3 vertebral  body with no known primary carcinoma.  EXAM:  CT CHEST, ABDOMEN, AND PELVIS WITH CONTRAST  TECHNIQUE:  Multidetector CT imaging of the chest, abdomen and pelvis was  performed following the standard protocol during bolus  administration of intravenous contrast.  CONTRAST: 171m OMNIPAQUE IOHEXOL 300 MG/ML SOLN  COMPARISON: MRI of the cervical spine earlier today.  FINDINGS:  CT CHEST FINDINGS  Within the chest, a right suprahilar mass measures approximately 3.0  x 2.7 x 2.2 cm. This either represents a nodal metastasis or central  lung mass such as small cell carcinoma. A number of smaller  mediastinal lymph nodes are also identified in the right  paratracheal region, prevascular space and AP window.  Destructive lesion of the T3 vertebral body noted which nearly  replaces the entire vertebral body with tumor extension posteriorly  into the epidural space causing significant compression of the  spinal cord. Paravertebral extension of tumor is also suspected  lateral and anterior to the vertebral body, right greater than left.  Massive enlargement of the thyroid gland present with transverse  dimensions of approximately 7.7 x 8.5 cm. The thyroid gland is  incompletely visualized as the neck was not included. Central  dystrophic calcification identified. The enlarged thyroid gland does  cause significant mass effect on the airway with evidence of  tracheal deviation to the right and tracheal narrowing at the  thoracic inlet. The enlarged thyroid gland extends into the superior   mediastinum.  There is evidence of pulmonary embolism with nonocclusive thrombus  identified in the right middle lobe and lower lobe pulmonary  arteries. Pulmonary arterial opacification is not optimal on the  standard venous phase evaluation of the chest but the right sided  pulmonary embolism is clearly present.  There are small bilateral pulmonary nodules. Right-sided pulmonary  nodule measuring 6 mm likely localizes to the very top of the lower  lobe near the major fissure. Left upper lobe nodule measures 7 mm.  Lingular nodule measures 7 mm. Left lower lobe nodule measures 7 mm.  Faint peripheral right middle lobe nodule measures 3 mm. Inferior  right middle lobe nodule measures 6 mm. Several small nodules in the  posterior right upper lobe identified in the 3-4 mm range.  No evidence of pleural or pericardial fluid. No other bony lesions  identified in the thorax.  CT ABDOMEN AND PELVIS  FINDINGS  Small low-density lesion in the left lobe of the liver measures 7 mm  and likely represents a cyst. No other hepatic abnormalities. The  gallbladder, pancreas, spleen, adrenal glands and kidneys are  unremarkable. No adenopathy is identified in the abdomen or pelvis.  Bowel shows normal caliber without evidence of obstruction, lesion  or inflammation. No abnormal fluid collections.  The bladder is moderately distended. Elongated cystic changes in  both adnexal regions identified which may represent a combination of  ovarian cysts and potentially hydrosalpinx. Correlation with pelvic  ultrasound is recommended. No hernias are identified. The uterus  appears unremarkable by CT. No bony lesions are identified in the  abdomen or pelvis.  IMPRESSION:  1. 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  2. 3 cm right suprahilar mass may represent nodal metastasis versus   primary central tumor. Next number multiple small bilateral  pulmonary nodules consistent with metastatic disease.  3. Acute nonocclusive pulmonary embolism in the right middle lobe  and right lower lobe pulmonary arteries.  4. Massive thyroid goiter causing tracheal deviation to the right  and narrowing of the trachea.  5. Tubular cystic abnormalities of both adnexal regions which may be  a combination of cystic changes in the ovaries and potentially  hydrosalpinx. Correlation recommended with pelvic ultrasound to  exclude any suspicious cystic lesions of the adnexae.  6. Critical Value/emergent results were called by telephone at the  time of interpretation on 12/14/2013 at 4:52 pm to Dr. Tamala Julian of the  [facility] Medicine Teaching Service, who verbally acknowledged these  results.  Electronically Signed  By: Aletta Edouard M.D.  On: 12/14/2013 16:53   MRI T-spine 9/23:  CLINICAL DATA: Lower extremity weakness. Worsening back pain.  Compressive upper thoracic mass partially visualized on cervical  spine MRI as well as chest CT with spinal cord compression.  EXAM:  MRI THORACIC SPINE WITHOUT CONTRAST  TECHNIQUE:  Multiplanar, multisequence MR imaging of the thoracic spine was  performed. No intravenous contrast was administered.  COMPARISON: Cervical spine MRI and chest CT earlier today  FINDINGS:  There is a T3 compression fracture with approximately 50% height  loss centrally. As described on cervical spine MRI and chest CT  earlier today, there is abnormal soft tissue replacing the majority  of the T3 vertebral body extending into the pedicles as well as into  the ventral epidural space with moderate cord compression. Epidural  extension is slightly greater on the right and extends into the  inferior aspect of the right T2-3 neural foramen as well as superior  aspect of the right T3-4 neural foramen. There is also abnormal  paravertebral soft tissue laterally and anteriorly as  seen on CT.  Central ventral epidural material posterior to the T4 vertebral body  may represent tumor or hematoma. Mild T2 hyperintensity is present  in the spinal cord at the T3 level extending inferiorly to the T4-5  disc space level consistent with mild edema. Mild edema is present  in the posterior paraspinal soft tissues of the upper thoracic  spine. No other vertebral body lesions are identified.  There is a moderate-sized central disc extrusion at T7-8 which  contacts and mildly flattens the ventral spinal cord with mild  spinal stenosis. A small left central disc protrusion is present at  T8-9 with at most minimal impression on the spinal cord. Massive  enlargement of the  left thyroid lobe is partially visualized.  Mediastinal and lung findings are better demonstrated on recent  chest CT.  IMPRESSION:  1. T3 vertebral body mass with pathologic compression fracture and  epidural tumor resulting in moderate cord compression with evidence  of mild spinal cord edema.  2. T7-8 disc extrusion resulting in mild spinal stenosis.   MRI Head / C-Spine 9/23:  CLINICAL DATA: 48 year old female who presented with back pain and  lower extremity numbness, with continued inadequately explained  lower extremity weakness following Hospital discharge on 12/12/2013.  Initial encounter.  EXAM:  MRI HEAD WITHOUT AND WITH CONTRAST  MRI CERVICAL SPINE WITHOUT AND WITH CONTRAST  TECHNIQUE:  Multiplanar, multiecho pulse sequences of the brain and surrounding  structures, and cervical spine, to include the craniocervical  junction and cervicothoracic junction, were obtained without and  with intravenous contrast.  CONTRAST: 20 mL MultiHance.  COMPARISON: Lumbar MRI 12/10/2013.  FINDINGS:  MRI HEAD FINDINGS  Cerebral volume is normal. No restricted diffusion to suggest acute  infarction. No midline shift, mass effect, evidence of mass lesion,  ventriculomegaly, extra-axial collection or acute  intracranial  hemorrhage. Cervicomedullary junction and pituitary are within  normal limits. Major intracranial vascular flow voids are preserved.  Pearline Cables and white matter signal is within normal limits for age  throughout the brain. No abnormal enhancement identified, pulsation  artifact prominent on axial post-contrast images.  Large body habitus. Small right frontal scalp lipoma (series 10,  image 37). Visible internal auditory structures appear normal. Mild  right mastoid fluid. Minor paranasal sinus mucosal thickening.  Visualized orbit soft tissues are within normal limits. Partially  visible thyromegaly greater on the left, see below. Normal bone  marrow signal.  MRI CERVICAL SPINE FINDINGS  Large body habitus resulting in wrap artifact on axial images.  However, there is massive thyroid enlargement evident, with the left  lobe measuring at least 7.2 x 5.2 x 11.2 cm (AP by transverse by  CC). The trachea is displaced to the right at the thoracic inlet and  mildly compressed. There is other regional mass effect including  sheath. On the left carotid  Fairly preserved cervical lordosis. No marrow edema or evidence of  acute osseous abnormality. Cervicomedullary junction is within  normal limits. Spinal cord signal is within normal limits at all  visualized levels. No abnormal intradural enhancement.  C2-C3: Negative.  C3-C4: Disc osteophyte complex, facet and ligament flavum  hypertrophy. Along with a degree of congenital canal narrowing,  there is mild spinal stenosis and moderate biforaminal stenosis.  C4-C5: Mild uncovertebral and facet hypertrophy. Mild right C5  foraminal stenosis.  C5-C6: Circumferential disc osteophyte complex. Along with a degree  of congenital canal narrowing there is mild spinal stenosis.  Moderate right C6 foraminal stenosis.  C6-C7: Negative except for mild facet hypertrophy.  C7-T1: Negative.  T1-T2: Negative.  Faintly visualized at the T3 level in  the upper thoracic spine there  is a lobulated solidly enhancing mass which appears to extend into  the epidural space from the T3 vertebral body. This is inseparable  from the spinal cord which is likely compressed. See series 2000  image 7 and series 1500, image 6. This is not included on axial  images.  IMPRESSION:  1. Faintly visible but aggressive appearing enhancing mass at the T3  level of the upper thoracic spine, likely with spinal cord  compression evaluate further are with thoracic MRI without and with  contrast.  2. Normal for age MRI appearance of  the brain.  3. Massive thyroid enlargement, greater on the left. Left thyroid  goiter/mass measuring at least 7.2 x 5.2 x 11.2 cm. Mass effect at  the thoracic inlet including on the trachea.  4. Mild chronic disc and endplate degeneration in the cervical spine  at C3-C4 and C5-C6 resulting in borderline to mild spinal stenosis,  but no cervical spinal cord mass effect or signal abnormality.  Electronically Signed:  By: Lars Pinks M.D.  On: 12/14/2013 09:50   MRI L-Spine 9/19:  CLINICAL DATA: Back pain with bilateral leg numbness, worsening  with oral steroids.  EXAM:  MRI LUMBAR SPINE WITHOUT CONTRAST  TECHNIQUE:  Multiplanar, multisequence MR imaging of the lumbar spine was  performed. No intravenous contrast was administered.  COMPARISON: CT 12/09/2013  FINDINGS:  There is no degenerative disease at L2-3 or above. The discs are  normal in signal characteristics and morphology. The spinal canal  and foramina are widely patent. The distal cord and conus are normal  with conus tip at L1. There are small benign and insignificant  hemangiomas within the L2 and L3 vertebral bodies.  L3-4: Minimal desiccation of the disc. Mild circumferential bulging  slightly more prominent towards the left. Bilateral facet  degeneration and hypertrophy. Mild narrowing of the lateral recesses  without apparent neural compression. Mild  foraminal narrowing on the  left without apparent neural compression.  L4-5: Desiccation of the disc with circumferential protrusion.  Bilateral facet and ligamentous hypertrophy. Stenosis of both  lateral recesses without definite focal neural compression. Mild  foraminal narrowing bilaterally.  L5-S1: Desiccation of the disc with loss of height. Endplate  osteophytes and bulging of the disc more prominent towards the left.  Bilateral facet degeneration and hypertrophy left more than right.  Stenosis of the subarticular lateral recess on the left that could  compress the S1 nerve root. Mild foraminal narrowing left more than  right but without apparent compression of the exiting L5 nerve  roots.  Serpiginous fluid-filled structure in the left pelvis could  represent hydrosalpinx. This is not primarily or completely imaged.  IMPRESSION:  Degenerative disc disease and degenerative facet disease in the  lower lumbar spine that could be a cause of lumbago.  Lateral recess and foraminal narrowing at L3-4, L4-5 and L5-S1 as  described above that could be a cause of focal neural compression.  In general, this is more pronounced on the left than on the right.  In particular, there is potential for left S1 nerve root compression  in the subarticular lateral recess at L5-S1.  Electronically Signed  By: Nelson Chimes M.D.  On: 12/10/2013 08:45   CT L-Spine 9/18:  CLINICAL DATA: Low back pain with difficulty walking.  EXAM:  CT LUMBAR SPINE WITHOUT CONTRAST  TECHNIQUE:  Multidetector CT imaging of the lumbar spine was performed without  intravenous contrast administration. Multiplanar CT image  reconstructions were also generated.  COMPARISON: None.  FINDINGS:  There is no fracture or spondylolisthesis. There is moderately  severe disc space narrowing at L5-S1 with slight vacuum phenomenon  at this level. There is moderate narrowing at L4-5. There are  prominent anterior osteophytes at L2,  L3, L4, and L5.  At T12-L1, there is moderate facet hypertrophy bilaterally. There is  no nerve root edema or effacement. No disc extrusion or stenosis.  At L1-2, there is moderate facet hypertrophy bilaterally. There is  mild diffuse disc bulging. There is no nerve root edema or  effacement. No disc extrusion or stenosis.  At L2-3, there is broad-based disc bulging and moderate facet  hypertrophy bilaterally. There is borderline narrowing of the thecal  sac with borderline spinal stenosis. No disc extrusion.  At L3-4, there is severe facet osteoarthritic change bilaterally,  more pronounced on the left than on the right. There is broad-based  disc bulging and moderate ligamentum flavum hypertrophy. These  findings in concert lead to moderate generalized spinal stenosis.  At L4-5, there is broad-based disc protrusion and severe facet  osteoarthritic change bilaterally. These findings in concert lead to  moderate generalized spinal stenosis. No frank disc extrusion.  At L5-S1, there is severe facet osteoarthritic change on the left  and moderate facet osteoarthritic change on the right. There is  broad-based disc protrusion. There is exit foraminal narrowing  bilaterally, more severe on the left than on the right, due to  osteophytic change. No frank disc extrusion or stenosis is seen at  this level.  There are no paraspinous lesions.  IMPRESSION:  Spinal stenosis at L3-4 and L4-5, multifactorial. Borderline spinal  stenosis at L2-3. There does appear to be a degree of congenital  narrowing of the canal which contributes to spinal stenosis.  Multilevel arthropathy.  No frank disc extrusion.  No fracture or spondylolisthesis.  Electronically Signed  By: Lowella Grip M.D.  On: 12/09/2013 13:50   CXR 9/13:  CLINICAL DATA: Upper back pain for 5 weeks. Dyspnea on exertion.  Nonsmoker. History of hypertension.  EXAM:  CHEST 2 VIEW  COMPARISON: 04/15/2006  FINDINGS:  The heart  is mildly enlarged. There is elevation of right  hemidiaphragm, stable in appearance. There is streaky density  favored to be within the right lower lobe, consistent with  atelectasis or infiltrate. No pulmonary edema. No pleural effusions.  There are moderate degenerative changes in the thoracic spine.  IMPRESSION:  Right lower lobe atelectasis or infiltrate.  Electronically Signed  By: Shon Hale M.D.  On: 12/04/2013 19:26  Venous Duplex 9/24:  Summary:  - No evidence of deep vein thrombosis involving the right lower extremity. - No evidence of deep vein thrombosis involving the left lower extremity.   Surgical Pathology Report: 12/21/2013 Diagnosis Soft tissue mass, simple excision, Thoracic Epidural Tumor - METASTATIC CARCINOMA. PLEASE SEE COMMENT. Microscopic Comment There are clusters of malignant cells with abundant eosinophilic cytoplasm. A battery of immunostains were performed and the tumor cells show the following profile: Cytokeratin 7 - strongly positive Cytokeratin 8/18 - strongly positive TTF-1 - strongly positive. Thyroglobulin - negative CD10 - negative Cytokeratin 20 - negative S100 - negative Vimentin - negative The control stained appropriately. The overall findings are diagnostic for metastatic carcinoma. 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 However, a metastatic oncocytic thyroid carcinoma is still in the differential. Clinical and radiographic correlation is highly recommended. (HCL:kh 12-19-13) Aldona Bar MD Pathologist, Electronic Signature (Case signed 12/20/2013)  Katheren Shams, DO 01/01/2014, 7:06 AM PGY-1, [place] Intern pager: [phone], text pages welcome

## 2014-01-01 NOTE — Progress Notes (Signed)
ANTICOAGULATION CONSULT NOTE - Follow Up Consult  Pharmacy Consult:  Heparin Indication: pulmonary embolus  Allergies  Allergen Reactions  . Tramadol Nausea And Vomiting    Patient Measurements: Height: 5\' 11"  (180.3 cm) Weight: 313 lb 15 oz (142.4 kg) IBW/kg (Calculated) : 70.8 Heparin Dosing Weight: 104 kg  Vital Signs: Temp: 98.4 F (36.9 C) (10/11 0930) Temp Source: Oral (10/11 0930) BP: 133/68 mmHg (10/11 0930) Pulse Rate: 100 (10/11 0930)  Labs:  Recent Labs  12/30/13 0650 12/31/13 0544 01/01/14 0500  HGB 11.5* 11.1* 10.4*  HCT 34.4* 32.0* 30.9*  PLT 309 280 211  HEPARINUNFRC 0.45  --   --     Estimated Creatinine Clearance: 133.1 ml/min (by C-G formula based on Cr of 0.82).   Assessment: Heparin level is 0.45, therapeutic on IV heparin drip 1600 units/hr in this 48 yo female who is receiving heparin for acute PE.  H/H stable at 11.5/34.4 and pltc 309K. No bleeding noted. O2 sats 100% on RA.    She was admitted on 9/21 with LE weakness and found to have mass at T-3 level with spinal compression. 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  S/p T3 s/p decompression laminectomy & biopsy 9/24. Found to have a PE on CT chest and started on IV heparin 48 hours post-op.  BLE venous duplex 12/15/13 negative for DVT.  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 9/30: Path returned with metastatic adenocarcinoma. MD notes that likely lung as primary, however, a metastatic oncocytic thyroid carcinoma is still in the differential. Endocrinology consulted and recommending surgery consult for thyroid surgery.    10/9 surgery for decompression of epidural tumor   10/11 Orders to restart heparin tonight. Will not bolus given recent surgery. Plans to start oral anticoagulation when stable. Hgb stable at 10.4 this morning.  Goal of Therapy:  Heparin level 0.3-0.7 units/ml Monitor platelets by anticoagulation protocol: Yes   Plan:  Heparin at 1600 units/hr -will restart at 1900 tonight.  Daily  heparin level and CBC Follow-up plans for long-term anticoagulation.  Long-term Lovenox is preferred based on the oncology guidelines. Dose would be 140mg  q12h-  Wt 142kg  Erin Hearing PharmD., BCPS Clinical Pharmacist Pager [phone] 01/01/2014 1:21 PM

## 2014-01-02 LAB — CBC
HCT: 28.4 % — ABNORMAL LOW (ref 36.0–46.0)
Hemoglobin: 9.9 g/dL — ABNORMAL LOW (ref 12.0–15.0)
MCH: 29 pg (ref 26.0–34.0)
MCHC: 34.9 g/dL (ref 30.0–36.0)
MCV: 83.3 fL (ref 78.0–100.0)
Platelets: 191 10*3/uL (ref 150–400)
RBC: 3.41 MIL/uL — ABNORMAL LOW (ref 3.87–5.11)
RDW: 14.8 % (ref 11.5–15.5)
WBC: 8.8 10*3/uL (ref 4.0–10.5)

## 2014-01-02 LAB — GLUCOSE, CAPILLARY
Glucose-Capillary: 169 mg/dL — ABNORMAL HIGH (ref 70–99)
Glucose-Capillary: 187 mg/dL — ABNORMAL HIGH (ref 70–99)
Glucose-Capillary: 227 mg/dL — ABNORMAL HIGH (ref 70–99)
Glucose-Capillary: 176 mg/dL — ABNORMAL HIGH (ref 70–99)

## 2014-01-02 LAB — HEPARIN LEVEL (UNFRACTIONATED)
Heparin Unfractionated: 0.22 IU/mL — ABNORMAL LOW (ref 0.30–0.70)
Heparin Unfractionated: 0.46 IU/mL (ref 0.30–0.70)

## 2014-01-02 NOTE — Progress Notes (Signed)
Physical Therapy Treatment Patient Details Name: Sarah [name] MRN: [id number] DOB: Aug 18, 1965 Today's Date: 01/02/2014    History of Present Illness 48 y.o. female admitted to [facility] on 12/12/13 with progressive lower extremity weakness and mid to upper back pain.  Pt was found to have a mass at T3 with (+) spinal cord compression.  On 9/23 pt underwent surgery for decompression and biopsy.  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  Her thyroid is very enlarged.  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 Pt is NOW S/P T1-T5 postlat arthrodesis allograft, T1-T5 segmental pedicle screw fixation on 12/30/13.  New therapy orders with updated activity orders written on 12/30/13.    PT Comments    Pt is progressing slowly with mobility and was agreeable to be lifted OOB to the chair today despite not feeling well (nauseated) and unable to attempt sitting EOB due to nausea.  Pt is ready for EOB attempts and possibly slide board transfer OOB to drop arm recliner chair that is in her room. I continue to think she would benefit from CIR level therapy for extensive education and mobility training as well as family education and training on how to assist the pt during her mobility.  Her mom and her sister plan to be her primary caregivers at home upon discharge.   Follow Up Recommendations  CIR     Equipment Recommendations  Wheelchair (measurements PT);Wheelchair cushion (measurements PT);Hospital bed;Other (comment);3in1 (PT) (hoyer lift, drop arm 3-in-1)    Recommendations for Other Services Rehab consult     Precautions / Restrictions Precautions Precautions: Fall Precaution Comments: bil leg weakness, paraplegia    Mobility  Bed Mobility Overal bed mobility: +2 for physical assistance Bed Mobility: Rolling Rolling: +2 for physical assistance;Max assist         General bed mobility comments: Two person max  assist to get pt positioned with outside knee flexed and manually assist to roll trunk while pt is pulling with her upper extremities to assist with transition to sidelying. Pt reports she has an easier time rolling to the left, but did equally as well to both sides today.   Transfers Overall transfer level: Needs assistance               General transfer comment: Used the maxi move lift to lift pt OOB to the chair to get the benefits of being upright in the chair and for more upright positioning (getting her ready for a WC).             Cognition Arousal/Alertness: Lethargic Behavior During Therapy: WFL for tasks assessed/performed Overall Cognitive Status: Within Functional Limits for tasks assessed                      Exercises Other Exercises Other Exercises: PROM performed on bil LEs ankle DF, knee/hip flexion wtih ER at end ROM, and hip abduction x 10 reps each 5-10 second holds.  Pt continues to have a slight increase in tone in her right leg compared to her left leg.  Mom reports she has been doing "some" ROM with her daughter's legs, and reports that the PROM handout is "in the van"/         Pertinent Vitals/Pain Pain Assessment: 0-10 Pain Score: 2  Pain Location: surgical site, mid to upper back Pain Descriptors / Indicators: Burning;Aching Pain Intervention(s): Limited activity  within patient's tolerance;Monitored during session;Repositioned           PT Goals (current goals can now be found in the care plan section) Acute Rehab PT Goals Patient Stated Goal: to get out of bed this week Progress towards PT goals: Progressing toward goals    Frequency  Min 3X/week    PT Plan Current plan remains appropriate       End of Session   Activity Tolerance: Other (comment) (limited by nausea) Patient left: in chair;with call bell/phone within reach;with family/visitor present     Time: 8185-6314 PT Time Calculation (min): 36 min  Charges:   $Therapeutic Exercise: 8-22 mins $Therapeutic Activity: 8-22 mins                      Lakota Markgraf B. Davey Bergsma, PT, DPT [phone]   01/02/2014, 4:57 PM

## 2014-01-02 NOTE — Progress Notes (Signed)
Patient ID: Sarah [name], female   DOB: November 24, 1965, 48 y.o.   MRN: [id number] BP 125/76  Pulse 97  Temp(Src) 97.8 F (36.6 C) (Oral)  Resp 20  Ht 5\' 11"  (1.803 m)  Wt 142.4 kg (313 lb 15 oz)  BMI 43.80 kg/m2  SpO2 97%  LMP 11/28/2013 Alert and oriented x 4 Moving upper extremities well No movement in lower extremities Can be up and out of bed, may sit up, does not need collar.  Wound clean, no signs of infection.  Awaiting next surgery. Discussed at tumor board, will need radiation to thoracic spine when ok.

## 2014-01-02 NOTE — Progress Notes (Signed)
Family Medicine Teaching Service Daily Progress Note Intern Pager: [phone]  Patient name: Sarah [name] Medical record number: [id number] Date of birth: [birth year] Age: 48 y.o. Gender: female  Primary Care Provider: No PCP Per Patient Consultants: Neurosurgery, IR, Oncology, Endocrinology Code Status: Full  Pt Overview and Major Events to Date:  - 9/22: Pt. Admitted with lower extremity weakness.  - 9/23: Pt. Found to have mass at T-3 level with spinal compression.  - 9/24: Surgery performed for decompression and biopsy overnight. Pt. Post-op and doing well. 3cm Suprahilar mass on CT Chest, PE also identified. ?Thyroid etiology, but much less likely for mets. Oncology consulted.  - 9/25 - 9/29: Awaiting pathology results with plan for oncology to see once path returns. Pt. With little neurological improvement. Started with light PT.  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 Oncology on board. Neurosurgery to comment on need for spinal stabilization surgery.  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 - 10/8: Underwent spinal stabilization surg. -10/10: Restarted heparin   Assessment and Plan: Sarah [name] 48 y.74 female presenting with continued lower extremity weakness at baseline after discharge from hospital on 9/21. PMH is significant for obesity, HTN, and ?History of Hypothyroidism previously on Synthroid per patient.   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  Neurosurgery took her to the OR on 9/23, and biopsied as well as performed  laminectomy for decompression. Surgical pathology with metastatic adenocarcinoma. Overall this indicates a likely very poor prognosis. 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  - PT recommendations SNF with 24hour supervision. Will plan dispo based on goals of care discussion.   - Per SW; SNF bed currently available, waiting for med stability; pt currently would rather go home than to SNF  - Discussed Recs for PT with Neurosurgery. Light PT at this point. Pt. Not to get UOB.  - Pain control:  Oxy IR prn. IV Morphine for breakthrough - Foley catheter in place for urinary retention; Monitor for fecal retention - pt. Continues to have BM on her own.   - Neurosurgery following: Patient was taken for spinal stabilization 10/9.   - Tapered Decadron:  10/3  4mg B20m(began on 9/24 at 6mg Q646m >> DC 10/9 - Oncology following   # Thyroid; Suggestive of Hurthle Cell Carcinoma.  - Large thyroid goiter noted. TSH normal; ultrasound (10/1) showing diffuse heterogenicity, cannot r/o malignancy, Biopsy on 10/2 - FNA results: "clusters of neoplastic cells w/ granular/oncogenic cytoplasm--similar to previous bx" - Oncology consulted endocrine to schedule thyroidectomy. Appreciate the input. Will follow notes to determine timeline. - We will contact Dr. Gerkin Harlow Asa(10/12) to see if he wants to do surgery while inpatient.  #Pulmonary Embolus - Found on CT Chest  Pt. With brief episodes of tachycardia, but otherwise asymptomatic with O2 sats 100% on 2L [place].  Blood pressures stable. Lower extremity Venous Duplex negative for DVT.  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 ESR elevated.  - Now on IV heparin -- Will begin bridging as soon as  possible (post surgical)  - Per pharm--recommendation for long-term Lovenox; I doubt this will be possible, patient is self-pay. If it is possible, Dose would be 130m SQ q12h; will plan to discuss anticoagulation w/  Onc (Dr. MJulien Nordmann  - Will continue to monitor VS. Stable at this time. - Restarted Heparin   #Hyperglycemia -  Likely diabetic with PCOS given A1C of 6.7. CBG's 180's - 200s.  - Patient informed of elevated A1C and likely diagnosis of DMII. She agrees with uKoreathat this is a marginal issue at this point, and we will recommend treatment based upon our goals of care discussion.  - Continue to monitor.  - Sensitive SSI; 15U Lantus.   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 PCOS vs. Adrenal patholog. Would be unlike to be first presentation of HPA pathology; no evidence of salt wasting on labs. Hypertensive and not hypotensive. Testing during this hospitalization after having recently received steroids would be largely non-diagnostic. Though, could also be a combination of isolated processes i.e. Clitoromegaly, Hirsutism 2/2 obesity. - Recent thought of outpt vs inpt w/up including [place] assess ovaries vs 21-hydroxylase/ACTH/cortisol.  - TSH, Cortisol WNL - A1C : 6.7 - consistent with PCOS given evidence of cystic structures in pelvis.   # UTI, 2/2 foley catheter - UCx = Proteus  - dysuria and suprapubic pain noted PM 10/1. - Cipro 2548mBID (10/2 started) -- treated for 7 days -- DC 10/9.  #HTN- BP fairly well controlled -continue losartan.   #Obesity-  - consistent with potential PCOS.   FEN/GI: Reg diet; KVO; s/p Enema with some relief Prophylaxis: on full dose IV heparin, SCDs  Disposition: Pending Goals of Care discussion and endocrinology recommendations. Possible discharge early next week.  Subjective: Post op Day 3. Patient doing well this morning. No new developments overnight. Continues to have regular BMs. States her pain is better controlled today. Eager to hear about the discussion we will have w/ Dr. GeHarlow Asa  Objective: Temp:  [98 F (36.7 C)-99.1 F (37.3 C)] 98.8 F (37.1 C) (10/12 0658) Pulse  Rate:  [92-100] 100 (10/12 0658) Resp:  [20] 20 (10/12 0658) BP: (133-159)/(68-83) 144/76 mmHg (10/12 0658) SpO2:  [96 %-100 %] 96 % (10/12 061103Physical Exam: General: NAD, AAOx3, morbidly obese  HEENT: Thyromegaly / Goiter noted on exam without tenderness, and no nodules, no LAD, neck supple, J-line in place; significant adipose tissue at base of posterior neck ("hump"), lipoma/cyst on forehead Cardio: distant heart sounds Lungs: anterior lung sounds CTA without wheezes. Unable to access posterior lung sounds at this time. Abdomen: Soft and nontender, mildly distended, No organomegaly noted.  Extremities: no edema,  No calf tenderness, Skin: No lesions or rashes noted, incision over T-spine C/D/I Neuro: cranial nerves grossly normal. Normal speech; 0/5  Plantarflexion BL feet. 0/5 Hip Flexor / extensor movement. Full; sensation to light touch, possibly L>R. Neuro exam is at new baseline. No new changes. No sensory deficits, and poor motor exam.   Laboratory:  Recent Labs Lab 12/31/13 0544 01/01/14 0500 01/02/14 0500  WBC 15.5* 11.0* 8.8  HGB 11.1* 10.4* 9.9*  HCT 32.0* 30.9* 28.4*  PLT 280 211 191    Recent Labs Lab 12/27/13 0701 12/28/13 0917  NA 130* 132*  K 4.8 4.5  CL 91* 90*  CO2 26 26  BUN 25* 27*  CREATININE 0.77 0.82  CALCIUM 9.0 8.8  GLUCOSE 239* 241*  Urinalysis    Component Value Date/Time   COLORURINE AMBER* 12/23/2013 1058   APPEARANCEUR TURBID* 12/23/2013 1058   LABSPEC 1.018 12/23/2013 1058   PHURINE 7.5 12/23/2013 1058   GLUCOSEU NEGATIVE 12/23/2013 1058   HGBUR LARGE* 12/23/2013 1058   BILIRUBINUR NEGATIVE 12/23/2013 1058   KETONESUR NEGATIVE 12/23/2013 1058   PROTEINUR 100* 12/23/2013 1058   UROBILINOGEN 0.2 12/23/2013 1058   NITRITE NEGATIVE 12/23/2013 1058   LEUKOCYTESUR LARGE* 12/23/2013 1058    Phos - 3.9 Mg - 2.2 CRP - 11.6 ESR - 59 CK - 56 B12 - 209 Random Cortisol - 25.4 A1C - 6.7 TSH - 2.13 RPR - nonreactive HIV - nonreactive UPT -  Negative  Imaging/Diagnostic Tests:  CT Chest Abd/ Pelvis 9/23:  CLINICAL DATA: Destructive metastatic lesion of the T3 vertebral  body with no known primary carcinoma.  EXAM:  CT CHEST, ABDOMEN, AND PELVIS WITH CONTRAST  TECHNIQUE:  Multidetector CT imaging of the chest, abdomen and pelvis was  performed following the standard protocol during bolus  administration of intravenous contrast.  CONTRAST: 181m OMNIPAQUE IOHEXOL 300 MG/ML SOLN  COMPARISON: MRI of the cervical spine earlier today.  FINDINGS:  CT CHEST FINDINGS  Within the chest, a right suprahilar mass measures approximately 3.0  x 2.7 x 2.2 cm. This either represents a nodal metastasis or central  lung mass such as small cell carcinoma. A number of smaller  mediastinal lymph nodes are also identified in the right  paratracheal region, prevascular space and AP window.  Destructive lesion of the T3 vertebral body noted which nearly  replaces the entire vertebral body with tumor extension posteriorly  into the epidural space causing significant compression of the  spinal cord. Paravertebral extension of tumor is also suspected  lateral and anterior to the vertebral body, right greater than left.  Massive enlargement of the thyroid gland present with transverse  dimensions of approximately 7.7 x 8.5 cm. The thyroid gland is  incompletely visualized as the neck was not included. Central  dystrophic calcification identified. The enlarged thyroid gland does  cause significant mass effect on the airway with evidence of  tracheal deviation to the right and tracheal narrowing at the  thoracic inlet. The enlarged thyroid gland extends into the superior  mediastinum.  There is evidence of pulmonary embolism with nonocclusive thrombus  identified in the right middle lobe and lower lobe pulmonary  arteries. Pulmonary arterial opacification is not optimal on the  standard venous phase evaluation of the chest but the right sided   pulmonary embolism is clearly present.  There are small bilateral pulmonary nodules. Right-sided pulmonary  nodule measuring 6 mm likely localizes to the very top of the lower  lobe near the major fissure. Left upper lobe nodule measures 7 mm.  Lingular nodule measures 7 mm. Left lower lobe nodule measures 7 mm.  Faint peripheral right middle lobe nodule measures 3 mm. Inferior  right middle lobe nodule measures 6 mm. Several small nodules in the  posterior right upper lobe identified in the 3-4 mm range.  No evidence of pleural or pericardial fluid. No other bony lesions  identified in the thorax.  CT ABDOMEN AND PELVIS FINDINGS  Small low-density lesion in the left lobe of the liver measures 7 mm  and likely represents a cyst. No other hepatic abnormalities. The  gallbladder, pancreas, spleen, adrenal glands and kidneys are  unremarkable. No adenopathy is identified in the abdomen or pelvis.  Bowel shows normal caliber without evidence of obstruction,  lesion  or inflammation. No abnormal fluid collections.  The bladder is moderately distended. Elongated cystic changes in  both adnexal regions identified which may represent a combination of  ovarian cysts and potentially hydrosalpinx. Correlation with pelvic  ultrasound is recommended. No hernias are identified. The uterus  appears unremarkable by CT. No bony lesions are identified in the  abdomen or pelvis.  IMPRESSION:  1. 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  2. 3 cm right suprahilar mass may represent nodal metastasis versus  primary central tumor. Next number multiple small bilateral  pulmonary nodules consistent with metastatic disease.  3. Acute nonocclusive pulmonary embolism in the right middle lobe  and right lower lobe pulmonary arteries.  4. Massive thyroid goiter causing tracheal  deviation to the right  and narrowing of the trachea.  5. Tubular cystic abnormalities of both adnexal regions which may be  a combination of cystic changes in the ovaries and potentially  hydrosalpinx. Correlation recommended with pelvic ultrasound to  exclude any suspicious cystic lesions of the adnexae.  6. Critical Value/emergent results were called by telephone at the  time of interpretation on 12/14/2013 at 4:52 pm to Dr. Tamala Julian of the  [facility] Medicine Teaching Service, who verbally acknowledged these  results.  Electronically Signed  By: Aletta Edouard M.D.  On: 12/14/2013 16:53   MRI T-spine 9/23:  CLINICAL DATA: Lower extremity weakness. Worsening back pain.  Compressive upper thoracic mass partially visualized on cervical  spine MRI as well as chest CT with spinal cord compression.  EXAM:  MRI THORACIC SPINE WITHOUT CONTRAST  TECHNIQUE:  Multiplanar, multisequence MR imaging of the thoracic spine was  performed. No intravenous contrast was administered.  COMPARISON: Cervical spine MRI and chest CT earlier today  FINDINGS:  There is a T3 compression fracture with approximately 50% height  loss centrally. As described on cervical spine MRI and chest CT  earlier today, there is abnormal soft tissue replacing the majority  of the T3 vertebral body extending into the pedicles as well as into  the ventral epidural space with moderate cord compression. Epidural  extension is slightly greater on the right and extends into the  inferior aspect of the right T2-3 neural foramen as well as superior  aspect of the right T3-4 neural foramen. There is also abnormal  paravertebral soft tissue laterally and anteriorly as seen on CT.  Central ventral epidural material posterior to the T4 vertebral body  may represent tumor or hematoma. Mild T2 hyperintensity is present  in the spinal cord at the T3 level extending inferiorly to the T4-5  disc space level consistent with mild edema. Mild  edema is present  in the posterior paraspinal soft tissues of the upper thoracic  spine. No other vertebral body lesions are identified.  There is a moderate-sized central disc extrusion at T7-8 which  contacts and mildly flattens the ventral spinal cord with mild  spinal stenosis. A small left central disc protrusion is present at  T8-9 with at most minimal impression on the spinal cord. Massive  enlargement of the left thyroid lobe is partially visualized.  Mediastinal and lung findings are better demonstrated on recent  chest CT.  IMPRESSION:  1. T3 vertebral body mass with pathologic compression fracture and  epidural tumor resulting in moderate cord compression with evidence  of mild spinal cord edema.  2. T7-8 disc extrusion resulting in  mild spinal stenosis.   MRI Head / C-Spine 9/23:  CLINICAL DATA: 48 year old female who presented with back pain and  lower extremity numbness, with continued inadequately explained  lower extremity weakness following Hospital discharge on 12/12/2013.  Initial encounter.  EXAM:  MRI HEAD WITHOUT AND WITH CONTRAST  MRI CERVICAL SPINE WITHOUT AND WITH CONTRAST  TECHNIQUE:  Multiplanar, multiecho pulse sequences of the brain and surrounding  structures, and cervical spine, to include the craniocervical  junction and cervicothoracic junction, were obtained without and  with intravenous contrast.  CONTRAST: 20 mL MultiHance.  COMPARISON: Lumbar MRI 12/10/2013.  FINDINGS:  MRI HEAD FINDINGS  Cerebral volume is normal. No restricted diffusion to suggest acute  infarction. No midline shift, mass effect, evidence of mass lesion,  ventriculomegaly, extra-axial collection or acute intracranial  hemorrhage. Cervicomedullary junction and pituitary are within  normal limits. Major intracranial vascular flow voids are preserved.  Pearline Cables and white matter signal is within normal limits for age  throughout the brain. No abnormal enhancement identified,  pulsation  artifact prominent on axial post-contrast images.  Large body habitus. Small right frontal scalp lipoma (series 10,  image 37). Visible internal auditory structures appear normal. Mild  right mastoid fluid. Minor paranasal sinus mucosal thickening.  Visualized orbit soft tissues are within normal limits. Partially  visible thyromegaly greater on the left, see below. Normal bone  marrow signal.  MRI CERVICAL SPINE FINDINGS  Large body habitus resulting in wrap artifact on axial images.  However, there is massive thyroid enlargement evident, with the left  lobe measuring at least 7.2 x 5.2 x 11.2 cm (AP by transverse by  CC). The trachea is displaced to the right at the thoracic inlet and  mildly compressed. There is other regional mass effect including  sheath. On the left carotid  Fairly preserved cervical lordosis. No marrow edema or evidence of  acute osseous abnormality. Cervicomedullary junction is within  normal limits. Spinal cord signal is within normal limits at all  visualized levels. No abnormal intradural enhancement.  C2-C3: Negative.  C3-C4: Disc osteophyte complex, facet and ligament flavum  hypertrophy. Along with a degree of congenital canal narrowing,  there is mild spinal stenosis and moderate biforaminal stenosis.  C4-C5: Mild uncovertebral and facet hypertrophy. Mild right C5  foraminal stenosis.  C5-C6: Circumferential disc osteophyte complex. Along with a degree  of congenital canal narrowing there is mild spinal stenosis.  Moderate right C6 foraminal stenosis.  C6-C7: Negative except for mild facet hypertrophy.  C7-T1: Negative.  T1-T2: Negative.  Faintly visualized at the T3 level in the upper thoracic spine there  is a lobulated solidly enhancing mass which appears to extend into  the epidural space from the T3 vertebral body. This is inseparable  from the spinal cord which is likely compressed. See series 2000  image 7 and series 1500, image 6.  This is not included on axial  images.  IMPRESSION:  1. Faintly visible but aggressive appearing enhancing mass at the T3  level of the upper thoracic spine, likely with spinal cord  compression evaluate further are with thoracic MRI without and with  contrast.  2. Normal for age MRI appearance of the brain.  3. Massive thyroid enlargement, greater on the left. Left thyroid  goiter/mass measuring at least 7.2 x 5.2 x 11.2 cm. Mass effect at  the thoracic inlet including on the trachea.  4. Mild chronic disc and endplate degeneration in the cervical spine  at C3-C4 and C5-C6 resulting in borderline  to mild spinal stenosis,  but no cervical spinal cord mass effect or signal abnormality.  Electronically Signed:  By: Lars Pinks M.D.  On: 12/14/2013 09:50   MRI L-Spine 9/19:  CLINICAL DATA: Back pain with bilateral leg numbness, worsening  with oral steroids.  EXAM:  MRI LUMBAR SPINE WITHOUT CONTRAST  TECHNIQUE:  Multiplanar, multisequence MR imaging of the lumbar spine was  performed. No intravenous contrast was administered.  COMPARISON: CT 12/09/2013  FINDINGS:  There is no degenerative disease at L2-3 or above. The discs are  normal in signal characteristics and morphology. The spinal canal  and foramina are widely patent. The distal cord and conus are normal  with conus tip at L1. There are small benign and insignificant  hemangiomas within the L2 and L3 vertebral bodies.  L3-4: Minimal desiccation of the disc. Mild circumferential bulging  slightly more prominent towards the left. Bilateral facet  degeneration and hypertrophy. Mild narrowing of the lateral recesses  without apparent neural compression. Mild foraminal narrowing on the  left without apparent neural compression.  L4-5: Desiccation of the disc with circumferential protrusion.  Bilateral facet and ligamentous hypertrophy. Stenosis of both  lateral recesses without definite focal neural compression. Mild  foraminal  narrowing bilaterally.  L5-S1: Desiccation of the disc with loss of height. Endplate  osteophytes and bulging of the disc more prominent towards the left.  Bilateral facet degeneration and hypertrophy left more than right.  Stenosis of the subarticular lateral recess on the left that could  compress the S1 nerve root. Mild foraminal narrowing left more than  right but without apparent compression of the exiting L5 nerve  roots.  Serpiginous fluid-filled structure in the left pelvis could  represent hydrosalpinx. This is not primarily or completely imaged.  IMPRESSION:  Degenerative disc disease and degenerative facet disease in the  lower lumbar spine that could be a cause of lumbago.  Lateral recess and foraminal narrowing at L3-4, L4-5 and L5-S1 as  described above that could be a cause of focal neural compression.  In general, this is more pronounced on the left than on the right.  In particular, there is potential for left S1 nerve root compression  in the subarticular lateral recess at L5-S1.  Electronically Signed  By: Nelson Chimes M.D.  On: 12/10/2013 08:45   CT L-Spine 9/18:  CLINICAL DATA: Low back pain with difficulty walking.  EXAM:  CT LUMBAR SPINE WITHOUT CONTRAST  TECHNIQUE:  Multidetector CT imaging of the lumbar spine was performed without  intravenous contrast administration. Multiplanar CT image  reconstructions were also generated.  COMPARISON: None.  FINDINGS:  There is no fracture or spondylolisthesis. There is moderately  severe disc space narrowing at L5-S1 with slight vacuum phenomenon  at this level. There is moderate narrowing at L4-5. There are  prominent anterior osteophytes at L2, L3, L4, and L5.  At T12-L1, there is moderate facet hypertrophy bilaterally. There is  no nerve root edema or effacement. No disc extrusion or stenosis.  At L1-2, there is moderate facet hypertrophy bilaterally. There is  mild diffuse disc bulging. There is no nerve root  edema or  effacement. No disc extrusion or stenosis.  At L2-3, there is broad-based disc bulging and moderate facet  hypertrophy bilaterally. There is borderline narrowing of the thecal  sac with borderline spinal stenosis. No disc extrusion.  At L3-4, there is severe facet osteoarthritic change bilaterally,  more pronounced on the left than on the right. There is broad-based  disc bulging  and moderate ligamentum flavum hypertrophy. These  findings in concert lead to moderate generalized spinal stenosis.  At L4-5, there is broad-based disc protrusion and severe facet  osteoarthritic change bilaterally. These findings in concert lead to  moderate generalized spinal stenosis. No frank disc extrusion.  At L5-S1, there is severe facet osteoarthritic change on the left  and moderate facet osteoarthritic change on the right. There is  broad-based disc protrusion. There is exit foraminal narrowing  bilaterally, more severe on the left than on the right, due to  osteophytic change. No frank disc extrusion or stenosis is seen at  this level.  There are no paraspinous lesions.  IMPRESSION:  Spinal stenosis at L3-4 and L4-5, multifactorial. Borderline spinal  stenosis at L2-3. There does appear to be a degree of congenital  narrowing of the canal which contributes to spinal stenosis.  Multilevel arthropathy.  No frank disc extrusion.  No fracture or spondylolisthesis.  Electronically Signed  By: Lowella Grip M.D.  On: 12/09/2013 13:50   CXR 9/13:  CLINICAL DATA: Upper back pain for 5 weeks. Dyspnea on exertion.  Nonsmoker. History of hypertension.  EXAM:  CHEST 2 VIEW  COMPARISON: 04/15/2006  FINDINGS:  The heart is mildly enlarged. There is elevation of right  hemidiaphragm, stable in appearance. There is streaky density  favored to be within the right lower lobe, consistent with  atelectasis or infiltrate. No pulmonary edema. No pleural effusions.  There are moderate degenerative  changes in the thoracic spine.  IMPRESSION:  Right lower lobe atelectasis or infiltrate.  Electronically Signed  By: Shon Hale M.D.  On: 12/04/2013 19:26  Venous Duplex 9/24:  Summary:  - No evidence of deep vein thrombosis involving the right lower extremity. - No evidence of deep vein thrombosis involving the left lower extremity.   Surgical Pathology Report: 12/21/2013 Diagnosis Soft tissue mass, simple excision, Thoracic Epidural Tumor - METASTATIC CARCINOMA. PLEASE SEE COMMENT. Microscopic Comment There are clusters of malignant cells with abundant eosinophilic cytoplasm. A battery of immunostains were performed and the tumor cells show the following profile: Cytokeratin 7 - strongly positive Cytokeratin 8/18 - strongly positive TTF-1 - strongly positive. Thyroglobulin - negative CD10 - negative Cytokeratin 20 - negative S100 - negative Vimentin - negative The control stained appropriately. The overall findings are diagnostic for metastatic carcinoma. 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 However, a metastatic oncocytic thyroid carcinoma is still in the differential. Clinical and radiographic correlation is highly recommended. (HCL:kh 12-19-13) Aldona Bar MD Pathologist, Electronic Signature (Case signed 12/20/2013)  Elberta Leatherwood, MD 01/02/2014, 8:41 AM PGY-1, [place] Intern pager: [phone], text pages welcome

## 2014-01-02 NOTE — Progress Notes (Signed)
FMTS Attending Daily Note:  Annabell Sabal MD  [phone] pager  Family Practice pager:  [phone] I have seen and examined this patient and have reviewed their chart. I have discussed this patient with the resident. I agree with the resident's findings, assessment and care plan.  Additionally:  - awaiting plan for thyroid disease.   Alveda Reasons, MD 01/02/2014 4:45 PM

## 2014-01-02 NOTE — Progress Notes (Signed)
ANTICOAGULATION CONSULT NOTE - Follow Up Consult  Pharmacy Consult for heparin Indication: pulmonary embolus  Labs:  Recent Labs  12/30/13 0650 12/31/13 0544 01/01/14 0500 01/02/14 0500  HGB 11.5* 11.1* 10.4* 9.9*  HCT 34.4* 32.0* 30.9* 28.4*  PLT 309 280 211 191  HEPARINUNFRC 0.45  --   --  0.22*    Assessment: 48yo female subtherapeutic on heparin with initial dosing for PE.  Goal of Therapy:  Heparin level 0.3-0.7 units/ml   Plan:  Will increase heparin gtt by 2 units/kg/hr to 1900 units/hr and check level in [place], PharmD, BCPS  01/02/2014,6:04 AM

## 2014-01-02 NOTE — Progress Notes (Signed)
UR complete.  Ross Bender RN, MSN 

## 2014-01-02 NOTE — Progress Notes (Signed)
ANTICOAGULATION CONSULT NOTE - Follow Up Consult  Pharmacy Consult for heparin Indication: pulmonary embolus  Labs:  Recent Labs  12/31/13 0544 01/01/14 0500 01/02/14 0500 01/02/14 1308  HGB 11.1* 10.4* 9.9*  --   HCT 32.0* 30.9* 28.4*  --   PLT 280 211 191  --   HEPARINUNFRC  --   --  0.22* 0.46    Assessment: 48yo female therapeutic on heparin for PE.  She has metastatic NSCLC.  She is self pay and even though enoxaparin is preferred agent for long term anticoagulation she may be discharged on another agent due to cost issues.Plan to follow up with oncology. She has no bleeding reported although a slight decrease in Hg has occurred since her surgery.  Goal of Therapy:  Heparin level 0.3-0.7 units/ml   Plan:  Cont heparin drip at 1900 units/hr. Check heparin level with am labs.  Sarah [name], Pharm D 01/02/2014,3:00 PM

## 2014-01-03 LAB — HEPARIN LEVEL (UNFRACTIONATED)
HEPARIN UNFRACTIONATED: 0.22 [IU]/mL — AB (ref 0.30–0.70)
HEPARIN UNFRACTIONATED: 0.17 [IU]/mL — AB (ref 0.30–0.70)

## 2014-01-03 LAB — GLUCOSE, CAPILLARY
GLUCOSE-CAPILLARY: 214 mg/dL — AB (ref 70–99)
GLUCOSE-CAPILLARY: 187 mg/dL — AB (ref 70–99)
Glucose-Capillary: 163 mg/dL — ABNORMAL HIGH (ref 70–99)
Glucose-Capillary: 154 mg/dL — ABNORMAL HIGH (ref 70–99)

## 2014-01-03 LAB — CBC
HCT: 26.8 % — ABNORMAL LOW (ref 36.0–46.0)
Hemoglobin: 9.3 g/dL — ABNORMAL LOW (ref 12.0–15.0)
MCH: 28.9 pg (ref 26.0–34.0)
MCHC: 34.7 g/dL (ref 30.0–36.0)
MCV: 83.2 fL (ref 78.0–100.0)
PLATELETS: 167 10*3/uL (ref 150–400)
RBC: 3.22 MIL/uL — ABNORMAL LOW (ref 3.87–5.11)
RDW: 14.8 % (ref 11.5–15.5)
WBC: 7.8 10*3/uL (ref 4.0–10.5)

## 2014-01-03 MED ORDER — HEPARIN BOLUS VIA INFUSION
1500.0000 [IU] | Freq: Once | INTRAVENOUS | Status: AC
  Administered 2014-01-03: 1500 [IU] via INTRAVENOUS
  Filled 2014-01-03: qty 1500

## 2014-01-03 MED ORDER — HEPARIN BOLUS VIA INFUSION
3100.0000 [IU] | Freq: Once | INTRAVENOUS | Status: AC
Start: 2014-01-03 — End: 2014-01-03
  Administered 2014-01-03: 3100 [IU] via INTRAVENOUS
  Filled 2014-01-03: qty 3100

## 2014-01-03 NOTE — Progress Notes (Signed)
ANTICOAGULATION CONSULT NOTE - Follow Up Consult  Pharmacy Consult for heparin Indication: pulmonary embolus  Labs:  Recent Labs  01/01/14 0500 01/02/14 0500  01/03/14 0759 01/03/14 1443 01/03/14 2158  HGB 10.4* 9.9*  --  9.3*  --   --   HCT 30.9* 28.4*  --  26.8*  --   --   PLT 211 191  --  167  --   --   HEPARINUNFRC  --  0.22*  < > <0.10* 0.22* 0.17*  < > = values in this interval not displayed.  Assessment: 48yo female therapeutic on heparin for PE.  She has metastatic NSCLC.  She is self pay and even though enoxaparin is preferred agent for long term anticoagulation she may be discharged on another agent due to cost issues.Plan to follow up with oncology. She has no bleeding reported although a slight decrease in Hg has occurred since her surgery. Heparin level this am <0.1 units/ml  RN notes placement of drip may have been an issue.  Changed lines around about 09:00. Heparin level this evening is subtherapeutic at 0.17. The nurse denies any bleeding or stoppage in infusion.  Goal of Therapy:  Heparin level 0.3-0.7 units/ml   Plan:  Bolus 3100 units of heparin Increase heparin drip to 2500 units/hr Check heparin level in 6 hours  Andrey Cota. Diona Foley, PharmD Clinical Pharmacist Pager [phone]  01/03/2014,10:21 PM

## 2014-01-03 NOTE — Progress Notes (Signed)
Occupational Therapy Treatment Patient Details Name: Sarah [name] MRN: [id number] DOB: 1965-04-13 Today's Date: 01/03/2014    History of present illness 48 y.o. female admitted to [facility] on 12/12/13 with progressive lower extremity weakness and mid to upper back pain.  Pt was found to have a mass at T3 with (+) spinal cord compression.  On 9/23 pt underwent surgery for decompression and biopsy.  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  Her thyroid is very enlarged.  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 Pt is NOW S/P T1-T5 postlat arthrodesis allograft, T1-T5 segmental pedicle screw fixation on 12/30/13.  New therapy orders with updated activity orders written on 12/30/13.   OT comments  Pt completed bed mobility log rolling R and L with incr participation and cues to maintain back precautions. Pt static sitting EOB this session ~5-8 minutes. Requesting Ted hose for further attempts. BP stable during session. Pt positioned in chair at end of session to help incr activity tolerance toward w/c level.   MD - please order bowel / bladder program protocol , ted hose  OT/PT recommending use of purple maxi slides for bed mobility and hoyer for OOB daily  Follow Up Recommendations  CIR;Supervision/Assistance - 24 hour    Equipment Recommendations  Other (comment) (w/c with cushion/ sliding board)    Recommendations for Other Services Rehab consult    Precautions / Restrictions Precautions Precautions: Fall Precaution Comments: bil leg weakness, paraplegia       Mobility Bed Mobility Overal bed mobility: +2 for physical assistance;Needs Assistance Bed Mobility: Rolling;Sidelying to Sit;Sit to Supine Rolling: +2 for physical assistance;Mod assist Sidelying to sit: +2 for physical assistance;Max assist Supine to sit: Max assist (total +4) Sit to supine: +2 for physical assistance;Max assist   General bed  mobility comments: Two person mod assist to roll.  Pt using her upper extremity to pull to her side and stabilize there.  Assist needed to position outer leg in flexion to help facilitate rolling and turning on her pelvis and lower trunk.  Side to sit much more difficult and required 3-4 person assist for safety and physical assistance.  Two people helping to manage bil legs and hip and two people helping to support her trunk during transition from side to sit.  Pt holding to bed rail, but did not seem to push through bed rail much to get to sitting.  Sit to sidelying and then to supine required 3-4 person physical assist, again, two supporting trunk as she came down to left elbow and two assisting with legs to get the legs back in the bed.   Transfers Overall transfer level: Needs assistance               General transfer comment: Ultimately used the maxi move total lift machine for two reasons: 1.  Pt was lightheaded EOB and BPs were soft, but not bad enough to prevent [place] from getting OOB to chair with the lift, and 2. Pt needed too much physical support in sitting to safely attempt slide board transfer at this time.     Balance Overall balance assessment: Needs assistance Sitting-balance support: Feet supported;Bilateral upper extremity supported Sitting balance-Leahy Scale: Zero Sitting balance - Comments: total assist to maintain sitting balance EOB both with and without upper extremity support.  Postural control: Posterior lean  ADL Overall ADL's : Needs assistance/impaired Eating/Feeding: Set up;Bed level                                     General ADL Comments: Pt incontinent on arrival and verbalized so to therapist. Pt log rolling R and L for peri care and with peri hygiene stimulation incr void. RN Roselyn Reef made aware patient could benefit from bowel/ bladder program (scheduled assisted voids to make sure full elimination occurs). Pt  completed supine<>Sit eob and tolerated for ~5-8 minutes total (A). pt return supine with peri hygiene second time. pt hoyer lifted to chair at end of session      Vision                     Perception     Praxis      Cognition   Behavior During Therapy: [facility] for tasks assessed/performed Overall Cognitive Status: Within Functional Limits for tasks assessed                       Extremity/Trunk Assessment               Exercises Other Exercises Other Exercises: Ot requesting RN Roselyn Reef order ted hose for next session to help with regulating BP   Shoulder Instructions       General Comments      Pertinent Vitals/ Pain       Pain Assessment: 0-10 Pain Score: 4  Pain Location: mid to upper back Pain Descriptors / Indicators: Aching;Burning Pain Intervention(s): Limited activity within patient's tolerance;Monitored during session;Repositioned;Patient requesting pain meds-RN notified  Home Living                                          Prior Functioning/Environment              Frequency Min 2X/week     Progress Toward Goals  OT Goals(current goals can now be found in the care plan section)  Progress towards OT goals: Progressing toward goals  Acute Rehab OT Goals Patient Stated Goal: to get out of bed this week OT Goal Formulation: With patient Time For Goal Achievement: 01/15/14 Potential to Achieve Goals: Good ADL Goals Pt Will Perform Eating: sitting;with supervision Pt Will Perform Grooming: sitting;with supervision Pt Will Perform Upper Body Bathing: sitting;with supervision Pt Will Perform Lower Body Bathing: with adaptive equipment;with mod assist;sitting/lateral leans;bed level Pt Will Perform Upper Body Dressing: sitting;with supervision Pt Will Perform Lower Body Dressing: with supervision;with adaptive equipment;sitting/lateral leans;bed level Pt Will Transfer to Toilet: bedside commode;with transfer  board;with +2 assist;with min assist Pt Will Perform Toileting - Clothing Manipulation and hygiene: sitting/lateral leans;with adaptive equipment;with max assist Pt Will Perform Tub/Shower Transfer: rolling walker (dc goal) Additional ADL Goal #1: Pt will tolerate sitting upright in recliner for 1-2 hours at a time to promote independence with goals.   Plan Discharge plan remains appropriate    Co-evaluation                 End of Session Equipment Utilized During Treatment: Other (comment) (hoyer)   Activity Tolerance Patient tolerated treatment well;Other (comment) (symptoms at EOB of dizzines BP obtained adn stable)   Patient Left in chair;with call bell/phone within reach;with family/visitor present   Nurse Communication  Mobility status;Precautions;Need for lift equipment        Time: 3748-2707 OT Time Calculation (min): 47 min  Charges: OT General Charges $OT Visit: 1 Procedure OT Treatments $Self Care/Home Management : 8-22 mins  Peri Maris 01/03/2014, 4:43 PM Pager: [phone]

## 2014-01-03 NOTE — Progress Notes (Signed)
ANTICOAGULATION CONSULT NOTE - Follow Up Consult  Pharmacy Consult for heparin Indication: pulmonary embolus  Labs:  Recent Labs  01/01/14 0500  01/02/14 0500 01/02/14 1308 01/03/14 0759 01/03/14 1443  HGB 10.4*  --  9.9*  --  9.3*  --   HCT 30.9*  --  28.4*  --  26.8*  --   PLT 211  --  191  --  167  --   HEPARINUNFRC  --   < > 0.22* 0.46 <0.10* 0.22*  < > = values in this interval not displayed.  Assessment: 48yo female therapeutic on heparin for PE.  She has metastatic NSCLC.  She is self pay and even though enoxaparin is preferred agent for long term anticoagulation she may be discharged on another agent due to cost issues.Plan to follow up with oncology. She has no bleeding reported although a slight decrease in Hg has occurred since her surgery. Heparin level this am <0.1 units/ml  RN notes placement of drip may have been an issue.  Changed lines around about 09:00. Heparin level this afternoon is subtherapeutic at 0.22.   Goal of Therapy:  Heparin level 0.3-0.7 units/ml   Plan:  Bolus 1500 units of heparin Increase heparin drip to 2100 units/hr Check heparin level in 6 hours  Andrey Cota. Diona Foley, PharmD Clinical Pharmacist Pager [phone]  01/03/2014,3:39 PM

## 2014-01-03 NOTE — Progress Notes (Signed)
ANTICOAGULATION CONSULT NOTE - Follow Up Consult  Pharmacy Consult for heparin Indication: pulmonary embolus  Labs:  Recent Labs  01/01/14 0500 01/02/14 0500 01/02/14 1308 01/03/14 0759  HGB 10.4* 9.9*  --  9.3*  HCT 30.9* 28.4*  --  26.8*  PLT 211 191  --  167  HEPARINUNFRC  --  0.22* 0.46 <0.10*    Assessment: 48yo female therapeutic on heparin for PE.  She has metastatic NSCLC.  She is self pay and even though enoxaparin is preferred agent for long term anticoagulation she may be discharged on another agent due to cost issues.Plan to follow up with oncology. She has no bleeding reported although a slight decrease in Hg has occurred since her surgery. Heparin level this am <0.1 units/ml  RN notes placement of drip may have been an issue.  Changed lines around about 09:00.  Goal of Therapy:  Heparin level 0.3-0.7 units/ml   Plan:  Cont heparin drip at 1900 units/hr. Check heparin level in 6 hours  Erastus Bartolomei, Pharm D 01/03/2014,10:53 AM

## 2014-01-03 NOTE — Progress Notes (Signed)
FMTS Attending Daily Note:  Annabell Sabal MD  [phone] pager  Family Practice pager:  [phone] I have seen and examined this patient and have reviewed their chart. I have discussed this patient with the resident. I agree with the resident's findings, assessment and care plan.   Alveda Reasons, MD 01/03/2014 4:54 PM

## 2014-01-03 NOTE — Progress Notes (Signed)
Physical Therapy Treatment Patient Details Name: Sarah [name] MRN: [id number] DOB: 1965-08-25 Today's Date: 01/03/2014    History of Present Illness 48 y.o. female admitted to [facility] on 12/12/13 with progressive lower extremity weakness and mid to upper back pain.  Pt was found to have a mass at T3 with (+) spinal cord compression.  On 9/23 pt underwent surgery for decompression and biopsy.  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  Her thyroid is very enlarged.  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 Pt is NOW S/P T1-T5 postlat arthrodesis allograft, T1-T5 segmental pedicle screw fixation on 12/30/13.  New therapy orders with updated activity orders written on 12/30/13.    PT Comments    Pt was able with 4 person assist to sit EOB today for the first time.  She was mod to max assist for all mobility and total assist to sit EOB with bil upper extremity support and a therapist behind her to support her trunk.  She did become lightheaded in sitting and BPs were soft, but stable 110s/60s with HR in the 110s.  Pt reported that she was able to sit up in the recliner chair yesterday evening for 2 hours.  Buttocks tissue looked good with peri care during session today with no signs of pressure wounds, but we will need to address this in future sessions.    Follow Up Recommendations  CIR     Equipment Recommendations  Wheelchair (measurements PT);Wheelchair cushion (measurements PT);Hospital bed;Other (comment);3in1 (PT) (hoyer lift, drop arm 3-in-1)    Recommendations for Other Services Rehab consult     Precautions / Restrictions Precautions Precautions: Fall Precaution Comments: bil leg weakness, paraplegia    Mobility  Bed Mobility Overal bed mobility: +2 for physical assistance;Needs Assistance Bed Mobility: Rolling;Sidelying to Sit;Sit to Supine Rolling: +2 for physical assistance;Mod assist Sidelying  to sit: +2 for physical assistance;Max assist Supine to sit: Max assist (total +4) Sit to supine: +2 for physical assistance;Max assist   General bed mobility comments: Two person mod assist to roll.  Pt using her upper extremity to pull to her side and stabilize there.  Assist needed to position outer leg in flexion to help facilitate rolling and turning on her pelvis and lower trunk.  Side to sit much more difficult and required 3-4 person assist for safety and physical assistance.  Two people helping to manage bil legs and hip and two people helping to support her trunk during transition from side to sit.  Pt holding to bed rail, but did not seem to push through bed rail much to get to sitting.  Sit to sidelying and then to supine required 3-4 person physical assist, again, two supporting trunk as she came down to left elbow and two assisting with legs to get the legs back in the bed.   Transfers Overall transfer level: Needs assistance               General transfer comment: Ultimately used the maxi move total lift machine for two reasons: 1.  Pt was lightheaded EOB and BPs were soft, but not bad enough to prevent [place] from getting OOB to chair with the lift, and 2. Pt needed too much physical support in sitting to safely attempt slide board transfer at this time.       Balance Overall balance assessment: Needs assistance Sitting-balance support: Feet supported;Bilateral upper extremity  supported Sitting balance-Leahy Scale: Zero Sitting balance - Comments: total assist to maintain sitting balance EOB both with and without upper extremity support.  Postural control: Posterior lean                          Cognition Arousal/Alertness: Awake/alert Behavior During Therapy: WFL for tasks assessed/performed Overall Cognitive Status: Within Functional Limits for tasks assessed                      Exercises Other Exercises Other Exercises: Ot requesting RN Roselyn Reef order  ted hose for next session to help with regulating BP    General Comments General comments (skin integrity, edema, etc.): Pt reporting lightheadedness in sitting.  BPs taken in sitting EOB and before end of session up in the recliner chair and were stable, but soft (110s/60s).  HR max 111 bpm.  SCDs applied to lower legs both in sitting EOB and when up in the chair.  RN to order TED hose to help with pressure support next session.       Pertinent Vitals/Pain Pain Assessment: 0-10 Pain Score: 4  Pain Location: mid to upper back Pain Descriptors / Indicators: Aching;Burning Pain Intervention(s): Limited activity within patient's tolerance;Monitored during session;Repositioned;Patient requesting pain meds-RN notified           PT Goals (current goals can now be found in the care plan section) Acute Rehab PT Goals Patient Stated Goal: to get out of bed this week Progress towards PT goals: Progressing toward goals    Frequency  Min 5X/week    PT Plan Frequency needs to be updated       End of Session   Activity Tolerance: Patient limited by pain;Other (comment) (and lightheadedness.) Patient left: in chair;with call bell/phone within reach;with family/visitor present     Time: [id number] PT Time Calculation (min): 47 min  Charges:  $Therapeutic Activity: 23-37 mins                      Elwood Bazinet B. Torien Ramroop, PT, DPT [phone]   01/03/2014, 4:49 PM

## 2014-01-03 NOTE — Progress Notes (Signed)
Family Medicine Teaching Service Daily Progress Note Intern Pager: [phone]  Patient name: Sarah [name] Medical record number: [id number] Date of birth: 05/17/1965 Age: 48 y.o. Gender: female  Primary Care Provider: No PCP Per Patient Consultants: Neurosurgery, IR, Oncology, Endocrinology Code Status: Full  Pt Overview and Major Events to Date:  - 9/22: Pt. Admitted with lower extremity weakness.  - 9/23: Pt. Found to have mass at T-3 level with spinal compression.  - 9/24: Surgery performed for decompression and biopsy overnight. Pt. Post-op and doing well. 3cm Suprahilar mass on CT Chest, PE also identified. ?Thyroid etiology, but much less likely for mets. Oncology consulted.  - 9/25 - 9/29: Awaiting pathology results with plan for oncology to see once path returns. Pt. With little neurological improvement. Started with light PT.  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 Oncology on board. Neurosurgery to comment on need for spinal stabilization surgery.  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 - 10/8: Underwent spinal stabilization surg. - 10/10: Restarted heparin  - 10/13: Dr. Gerkin unable to perform surg as inpatient; will seek other consultations  Assessment and Plan: Sarah [name] is a 48 y.o. female presenting with continued lower extremity weakness at baseline after discharge from hospital on 9/21. PMH is significant for obesity, HTN, and ?History of Hypothyroidism previously on Synthroid per patient.   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  Neurosurgery took her to the OR on 9/23, and biopsied as well as performed laminectomy for decompression. Surgical pathology with metastatic adenocarcinoma. Overall this indicates a likely very poor prognosis. 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  - PT recommendations SNF with 24hour supervision. Will plan dispo based on goals of care discussion.   - Per SW; SNF bed currently available, waiting for med stability; pt currently would rather go home than to SNF  - Discussed Recs for PT with Neurosurgery. Light PT at this point. Pt. Not to get UOB.  - Pain control:  Oxy IR prn. IV Morphine for breakthrough - Foley catheter in place for urinary retention; Monitor for fecal retention - pt. Continues to have BM on her own.   - Neurosurgery following: Patient was taken for spinal stabilization 10/9.   - Tapered Decadron:  10/3  4mg BID (began on 9/24 at 6mg Q6hr) >> DC 10/9 - Oncology following   # Thyroid; Suggestive of Hurthle Cell Carcinoma.  - Large thyroid goiter noted. TSH normal; ultrasound (10/1) showing diffuse heterogenicity, cannot r/o malignancy, Biopsy on 10/2 - FNA results: "clusters of neoplastic cells w/ granular/oncogenic cytoplasm--similar to previous bx" - Oncology consulted endocrine to schedule thyroidectomy. Appreciate the input. Will follow notes to determine timeline. - Dr. Gerkin unable to perform surg as inpatient; will seek other consultations. Patient and mother voiced interest in seeking their own ENT consultation, I will hesitate until tomorrow morning before moving forward in this.  #Pulmonary Embolus - Found on CT Chest  Pt. With brief episodes of tachycardia, but otherwise asymptomatic with O2 sats 100% on 2L .  Blood   pressures stable. Lower extremity Venous Duplex negative for DVT.  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 ESR elevated.  - Now on IV heparin -- Will begin bridging as soon as  possible (post surgical)  - Per pharm--recommendation for long-term Lovenox; I doubt this will be possible, patient is self-pay. If it is possible, Dose would be 150mg SQ q12h; will plan to discuss anticoagulation w/ Onc (Dr. Mohamed)  - Will continue to monitor VS. Stable at this time. - Restarted Heparin   #Hyperglycemia -  Likely diabetic with PCOS given A1C of 6.7. CBG's 180's - 200s.  - Patient informed of elevated A1C and likely diagnosis of DMII. She agrees with us that this is a marginal issue at this point, and we will recommend treatment based upon our goals of care discussion.  - Continue to monitor.  - Sensitive SSI; 15U Lantus.   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 PCOS vs. Adrenal patholog. Would be unlike to be first presentation of HPA pathology; no evidence of salt wasting on labs. Hypertensive and not hypotensive. Testing during this hospitalization after having recently received steroids would be largely non-diagnostic. Though, could also be a combination of isolated processes i.e. Clitoromegaly, Hirsutism 2/2 obesity. - Recent thought of outpt vs inpt w/up including US to assess ovaries vs 21-hydroxylase/ACTH/cortisol.  - TSH, Cortisol WNL - A1C : 6.7 - consistent with PCOS given evidence of cystic structures in pelvis.   # UTI, 2/2 foley catheter - UCx = Proteus  - dysuria and suprapubic pain noted PM 10/1. - Cipro 250mg BID (10/2 started) -- treated for 7 days -- DC 10/9.  #HTN- BP fairly well controlled -continue losartan.   #Obesity-  - consistent with potential PCOS.   FEN/GI: Reg diet; KVO; s/p Enema with some relief Prophylaxis: on full dose IV heparin, SCDs  Disposition: Pending Goals of Care discussion and endocrinology recommendations. Possible discharge early next week.  Subjective: Post op Day 4. Patient doing well this morning. No new developments overnight. Continues to  have regular BMs. States her pain is a little worse today. PT will be in to work with her later today.   Objective: Temp:  [97.8 F (36.6 C)-99 F (37.2 C)] 99 F (37.2 C) (10/13 1436) Pulse Rate:  [94-112] 112 (10/13 1436) Resp:  [16-20] 20 (10/13 1436) BP: (117-154)/(57-76) 129/63 mmHg (10/13 1436) SpO2:  [96 %-100 %] 100 % (10/13 1436) Physical Exam: General: NAD, AAOx3, morbidly obese  HEENT: Thyromegaly / Goiter noted on exam without tenderness, and no nodules, no LAD, neck supple, J-line in place; significant adipose tissue at base of posterior neck ("hump"), lipoma/cyst on forehead Cardio: distant heart sounds Lungs: anterior lung sounds CTA without wheezes. Unable to access posterior lung sounds at this time. Abdomen: Soft and nontender, mildly distended, No organomegaly noted.  Extremities: no edema,  No calf tenderness, Skin: No lesions or rashes noted, incision over T-spine C/D/I Neuro: cranial nerves grossly normal. Normal speech; 0/5  Plantarflexion BL feet. 0/5 Hip Flexor / extensor movement. Full; sensation to light touch, possibly L>R. Neuro exam is at new baseline. No new changes. No sensory deficits, and poor motor exam.   Laboratory:  Recent Labs Lab 01/01/14 0500 01/02/14 0500 01/03/14 0759  WBC 11.0* 8.8 7.8  HGB 10.4* 9.9* 9.3*  HCT 30.9* 28.4* 26.8*  PLT 211 191 167    Recent Labs Lab 12/28/13 0917  NA 132*  K   4.5  CL 90*  CO2 26  BUN 27*  CREATININE 0.82  CALCIUM 8.8  GLUCOSE 241*   Urinalysis    Component Value Date/Time   COLORURINE AMBER* 12/23/2013 1058   APPEARANCEUR TURBID* 12/23/2013 1058   LABSPEC 1.018 12/23/2013 1058   PHURINE 7.5 12/23/2013 1058   GLUCOSEU NEGATIVE 12/23/2013 1058   HGBUR LARGE* 12/23/2013 1058   BILIRUBINUR NEGATIVE 12/23/2013 1058   KETONESUR NEGATIVE 12/23/2013 1058   PROTEINUR 100* 12/23/2013 1058   UROBILINOGEN 0.2 12/23/2013 1058   NITRITE NEGATIVE 12/23/2013 1058   LEUKOCYTESUR LARGE* 12/23/2013 1058    Phos  - 3.9 Mg - 2.2 CRP - 11.6 ESR - 59 CK - 56 B12 - 209 Random Cortisol - 25.4 A1C - 6.7 TSH - 2.13 RPR - nonreactive HIV - nonreactive UPT - Negative  Imaging/Diagnostic Tests:  CT Chest Abd/ Pelvis 9/23:  CLINICAL DATA: Destructive metastatic lesion of the T3 vertebral  body with no known primary carcinoma.  EXAM:  CT CHEST, ABDOMEN, AND PELVIS WITH CONTRAST  TECHNIQUE:  Multidetector CT imaging of the chest, abdomen and pelvis was  performed following the standard protocol during bolus  administration of intravenous contrast.  CONTRAST: 151m OMNIPAQUE IOHEXOL 300 MG/ML SOLN  COMPARISON: MRI of the cervical spine earlier today.  FINDINGS:  CT CHEST FINDINGS  Within the chest, a right suprahilar mass measures approximately 3.0  x 2.7 x 2.2 cm. This either represents a nodal metastasis or central  lung mass such as small cell carcinoma. A number of smaller  mediastinal lymph nodes are also identified in the right  paratracheal region, prevascular space and AP window.  Destructive lesion of the T3 vertebral body noted which nearly  replaces the entire vertebral body with tumor extension posteriorly  into the epidural space causing significant compression of the  spinal cord. Paravertebral extension of tumor is also suspected  lateral and anterior to the vertebral body, right greater than left.  Massive enlargement of the thyroid gland present with transverse  dimensions of approximately 7.7 x 8.5 cm. The thyroid gland is  incompletely visualized as the neck was not included. Central  dystrophic calcification identified. The enlarged thyroid gland does  cause significant mass effect on the airway with evidence of  tracheal deviation to the right and tracheal narrowing at the  thoracic inlet. The enlarged thyroid gland extends into the superior  mediastinum.  There is evidence of pulmonary embolism with nonocclusive thrombus  identified in the right middle lobe and lower  lobe pulmonary  arteries. Pulmonary arterial opacification is not optimal on the  standard venous phase evaluation of the chest but the right sided  pulmonary embolism is clearly present.  There are small bilateral pulmonary nodules. Right-sided pulmonary  nodule measuring 6 mm likely localizes to the very top of the lower  lobe near the major fissure. Left upper lobe nodule measures 7 mm.  Lingular nodule measures 7 mm. Left lower lobe nodule measures 7 mm.  Faint peripheral right middle lobe nodule measures 3 mm. Inferior  right middle lobe nodule measures 6 mm. Several small nodules in the  posterior right upper lobe identified in the 3-4 mm range.  No evidence of pleural or pericardial fluid. No other bony lesions  identified in the thorax.  CT ABDOMEN AND PELVIS FINDINGS  Small low-density lesion in the left lobe of the liver measures 7 mm  and likely represents a cyst. No other hepatic abnormalities. The  gallbladder, pancreas, spleen, adrenal glands and kidneys  are  unremarkable. No adenopathy is identified in the abdomen or pelvis.  Bowel shows normal caliber without evidence of obstruction, lesion  or inflammation. No abnormal fluid collections.  The bladder is moderately distended. Elongated cystic changes in  both adnexal regions identified which may represent a combination of  ovarian cysts and potentially hydrosalpinx. Correlation with pelvic  ultrasound is recommended. No hernias are identified. The uterus  appears unremarkable by CT. No bony lesions are identified in the  abdomen or pelvis.  IMPRESSION:  1. 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  2. 3 cm right suprahilar mass may represent nodal metastasis versus  primary central tumor. Next number multiple small bilateral  pulmonary nodules consistent with metastatic disease.  3. Acute  nonocclusive pulmonary embolism in the right middle lobe  and right lower lobe pulmonary arteries.  4. Massive thyroid goiter causing tracheal deviation to the right  and narrowing of the trachea.  5. Tubular cystic abnormalities of both adnexal regions which may be  a combination of cystic changes in the ovaries and potentially  hydrosalpinx. Correlation recommended with pelvic ultrasound to  exclude any suspicious cystic lesions of the adnexae.  6. Critical Value/emergent results were called by telephone at the  time of interpretation on 12/14/2013 at 4:52 pm to Dr. Smith of the  Family Medicine Teaching Service, who verbally acknowledged these  results.  Electronically Signed  By: Glenn Yamagata M.D.  On: 12/14/2013 16:53   MRI T-spine 9/23:  CLINICAL DATA: Lower extremity weakness. Worsening back pain.  Compressive upper thoracic mass partially visualized on cervical  spine MRI as well as chest CT with spinal cord compression.  EXAM:  MRI THORACIC SPINE WITHOUT CONTRAST  TECHNIQUE:  Multiplanar, multisequence MR imaging of the thoracic spine was  performed. No intravenous contrast was administered.  COMPARISON: Cervical spine MRI and chest CT earlier today  FINDINGS:  There is a T3 compression fracture with approximately 50% height  loss centrally. As described on cervical spine MRI and chest CT  earlier today, there is abnormal soft tissue replacing the majority  of the T3 vertebral body extending into the pedicles as well as into  the ventral epidural space with moderate cord compression. Epidural  extension is slightly greater on the right and extends into the  inferior aspect of the right T2-3 neural foramen as well as superior  aspect of the right T3-4 neural foramen. There is also abnormal  paravertebral soft tissue laterally and anteriorly as seen on CT.  Central ventral epidural material posterior to the T4 vertebral body  may represent tumor or hematoma. Mild T2  hyperintensity is present  in the spinal cord at the T3 level extending inferiorly to the T4-5  disc space level consistent with mild edema. Mild edema is present  in the posterior paraspinal soft tissues of the upper thoracic  spine. No other vertebral body lesions are identified.  There is a moderate-sized central disc extrusion at T7-8 which  contacts and mildly flattens the ventral spinal cord with mild  spinal stenosis. A small left central disc protrusion is present at  T8-9 with at most minimal impression on the spinal cord. Massive  enlargement of the left thyroid lobe is partially visualized.  Mediastinal and lung findings are better demonstrated on recent  chest CT.  IMPRESSION:  1. T3 vertebral body mass with pathologic compression fracture and  epidural   tumor resulting in moderate cord compression with evidence  of mild spinal cord edema.  2. T7-8 disc extrusion resulting in mild spinal stenosis.   MRI Head / C-Spine 9/23:  CLINICAL DATA: 48-year-old female who presented with back pain and  lower extremity numbness, with continued inadequately explained  lower extremity weakness following Hospital discharge on 12/12/2013.  Initial encounter.  EXAM:  MRI HEAD WITHOUT AND WITH CONTRAST  MRI CERVICAL SPINE WITHOUT AND WITH CONTRAST  TECHNIQUE:  Multiplanar, multiecho pulse sequences of the brain and surrounding  structures, and cervical spine, to include the craniocervical  junction and cervicothoracic junction, were obtained without and  with intravenous contrast.  CONTRAST: 20 mL MultiHance.  COMPARISON: Lumbar MRI 12/10/2013.  FINDINGS:  MRI HEAD FINDINGS  Cerebral volume is normal. No restricted diffusion to suggest acute  infarction. No midline shift, mass effect, evidence of mass lesion,  ventriculomegaly, extra-axial collection or acute intracranial  hemorrhage. Cervicomedullary junction and pituitary are within  normal limits. Major intracranial vascular flow  voids are preserved.  Gray and white matter signal is within normal limits for age  throughout the brain. No abnormal enhancement identified, pulsation  artifact prominent on axial post-contrast images.  Large body habitus. Small right frontal scalp lipoma (series 10,  image 37). Visible internal auditory structures appear normal. Mild  right mastoid fluid. Minor paranasal sinus mucosal thickening.  Visualized orbit soft tissues are within normal limits. Partially  visible thyromegaly greater on the left, see below. Normal bone  marrow signal.  MRI CERVICAL SPINE FINDINGS  Large body habitus resulting in wrap artifact on axial images.  However, there is massive thyroid enlargement evident, with the left  lobe measuring at least 7.2 x 5.2 x 11.2 cm (AP by transverse by  CC). The trachea is displaced to the right at the thoracic inlet and  mildly compressed. There is other regional mass effect including  sheath. On the left carotid  Fairly preserved cervical lordosis. No marrow edema or evidence of  acute osseous abnormality. Cervicomedullary junction is within  normal limits. Spinal cord signal is within normal limits at all  visualized levels. No abnormal intradural enhancement.  C2-C3: Negative.  C3-C4: Disc osteophyte complex, facet and ligament flavum  hypertrophy. Along with a degree of congenital canal narrowing,  there is mild spinal stenosis and moderate biforaminal stenosis.  C4-C5: Mild uncovertebral and facet hypertrophy. Mild right C5  foraminal stenosis.  C5-C6: Circumferential disc osteophyte complex. Along with a degree  of congenital canal narrowing there is mild spinal stenosis.  Moderate right C6 foraminal stenosis.  C6-C7: Negative except for mild facet hypertrophy.  C7-T1: Negative.  T1-T2: Negative.  Faintly visualized at the T3 level in the upper thoracic spine there  is a lobulated solidly enhancing mass which appears to extend into  the epidural space from  the T3 vertebral body. This is inseparable  from the spinal cord which is likely compressed. See series 2000  image 7 and series 1500, image 6. This is not included on axial  images.  IMPRESSION:  1. Faintly visible but aggressive appearing enhancing mass at the T3  level of the upper thoracic spine, likely with spinal cord  compression evaluate further are with thoracic MRI without and with  contrast.  2. Normal for age MRI appearance of the brain.  3. Massive thyroid enlargement, greater on the left. Left thyroid  goiter/mass measuring at least 7.2 x 5.2 x 11.2 cm. Mass effect at  the thoracic inlet including on the   trachea.  4. Mild chronic disc and endplate degeneration in the cervical spine  at C3-C4 and C5-C6 resulting in borderline to mild spinal stenosis,  but no cervical spinal cord mass effect or signal abnormality.  Electronically Signed:  By: Lee Hall M.D.  On: 12/14/2013 09:50   MRI L-Spine 9/19:  CLINICAL DATA: Back pain with bilateral leg numbness, worsening  with oral steroids.  EXAM:  MRI LUMBAR SPINE WITHOUT CONTRAST  TECHNIQUE:  Multiplanar, multisequence MR imaging of the lumbar spine was  performed. No intravenous contrast was administered.  COMPARISON: CT 12/09/2013  FINDINGS:  There is no degenerative disease at L2-3 or above. The discs are  normal in signal characteristics and morphology. The spinal canal  and foramina are widely patent. The distal cord and conus are normal  with conus tip at L1. There are small benign and insignificant  hemangiomas within the L2 and L3 vertebral bodies.  L3-4: Minimal desiccation of the disc. Mild circumferential bulging  slightly more prominent towards the left. Bilateral facet  degeneration and hypertrophy. Mild narrowing of the lateral recesses  without apparent neural compression. Mild foraminal narrowing on the  left without apparent neural compression.  L4-5: Desiccation of the disc with circumferential  protrusion.  Bilateral facet and ligamentous hypertrophy. Stenosis of both  lateral recesses without definite focal neural compression. Mild  foraminal narrowing bilaterally.  L5-S1: Desiccation of the disc with loss of height. Endplate  osteophytes and bulging of the disc more prominent towards the left.  Bilateral facet degeneration and hypertrophy left more than right.  Stenosis of the subarticular lateral recess on the left that could  compress the S1 nerve root. Mild foraminal narrowing left more than  right but without apparent compression of the exiting L5 nerve  roots.  Serpiginous fluid-filled structure in the left pelvis could  represent hydrosalpinx. This is not primarily or completely imaged.  IMPRESSION:  Degenerative disc disease and degenerative facet disease in the  lower lumbar spine that could be a cause of lumbago.  Lateral recess and foraminal narrowing at L3-4, L4-5 and L5-S1 as  described above that could be a cause of focal neural compression.  In general, this is more pronounced on the left than on the right.  In particular, there is potential for left S1 nerve root compression  in the subarticular lateral recess at L5-S1.  Electronically Signed  By: Mark Shogry M.D.  On: 12/10/2013 08:45   CT L-Spine 9/18:  CLINICAL DATA: Low back pain with difficulty walking.  EXAM:  CT LUMBAR SPINE WITHOUT CONTRAST  TECHNIQUE:  Multidetector CT imaging of the lumbar spine was performed without  intravenous contrast administration. Multiplanar CT image  reconstructions were also generated.  COMPARISON: None.  FINDINGS:  There is no fracture or spondylolisthesis. There is moderately  severe disc space narrowing at L5-S1 with slight vacuum phenomenon  at this level. There is moderate narrowing at L4-5. There are  prominent anterior osteophytes at L2, L3, L4, and L5.  At T12-L1, there is moderate facet hypertrophy bilaterally. There is  no nerve root edema or effacement.  No disc extrusion or stenosis.  At L1-2, there is moderate facet hypertrophy bilaterally. There is  mild diffuse disc bulging. There is no nerve root edema or  effacement. No disc extrusion or stenosis.  At L2-3, there is broad-based disc bulging and moderate facet  hypertrophy bilaterally. There is borderline narrowing of the thecal  sac with borderline spinal stenosis. No disc extrusion.  At L3-4, there is   severe facet osteoarthritic change bilaterally,  more pronounced on the left than on the right. There is broad-based  disc bulging and moderate ligamentum flavum hypertrophy. These  findings in concert lead to moderate generalized spinal stenosis.  At L4-5, there is broad-based disc protrusion and severe facet  osteoarthritic change bilaterally. These findings in concert lead to  moderate generalized spinal stenosis. No frank disc extrusion.  At L5-S1, there is severe facet osteoarthritic change on the left  and moderate facet osteoarthritic change on the right. There is  broad-based disc protrusion. There is exit foraminal narrowing  bilaterally, more severe on the left than on the right, due to  osteophytic change. No frank disc extrusion or stenosis is seen at  this level.  There are no paraspinous lesions.  IMPRESSION:  Spinal stenosis at L3-4 and L4-5, multifactorial. Borderline spinal  stenosis at L2-3. There does appear to be a degree of congenital  narrowing of the canal which contributes to spinal stenosis.  Multilevel arthropathy.  No frank disc extrusion.  No fracture or spondylolisthesis.  Electronically Signed  By: Lowella Grip M.D.  On: 12/09/2013 13:50   CXR 9/13:  CLINICAL DATA: Upper back pain for 5 weeks. Dyspnea on exertion.  Nonsmoker. History of hypertension.  EXAM:  CHEST 2 VIEW  COMPARISON: 04/15/2006  FINDINGS:  The heart is mildly enlarged. There is elevation of right  hemidiaphragm, stable in appearance. There is streaky density  favored to  be within the right lower lobe, consistent with  atelectasis or infiltrate. No pulmonary edema. No pleural effusions.  There are moderate degenerative changes in the thoracic spine.  IMPRESSION:  Right lower lobe atelectasis or infiltrate.  Electronically Signed  By: Shon Hale M.D.  On: 12/04/2013 19:26  Venous Duplex 9/24:  Summary:  - No evidence of deep vein thrombosis involving the right lower extremity. - No evidence of deep vein thrombosis involving the left lower extremity.   Surgical Pathology Report: 12/21/2013 Diagnosis Soft tissue mass, simple excision, Thoracic Epidural Tumor - METASTATIC CARCINOMA. PLEASE SEE COMMENT. Microscopic Comment There are clusters of malignant cells with abundant eosinophilic cytoplasm. A battery of immunostains were performed and the tumor cells show the following profile: Cytokeratin 7 - strongly positive Cytokeratin 8/18 - strongly positive TTF-1 - strongly positive. Thyroglobulin - negative CD10 - negative Cytokeratin 20 - negative S100 - negative Vimentin - negative The control stained appropriately. The overall findings are diagnostic for metastatic carcinoma. 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 However, a metastatic oncocytic thyroid carcinoma is still in the differential. Clinical and radiographic correlation is highly recommended. (HCL:kh 12-19-13) Aldona Bar MD Pathologist, Electronic Signature (Case signed 12/20/2013)  Elberta Leatherwood, MD 01/03/2014, 3:13 PM PGY-1, [facility] [employer] pager: [phone], text pages welcome

## 2014-01-03 NOTE — Progress Notes (Signed)
OT NOTE  Pt could benefit from a "BOWEL and BLADDER protocol " order to help with voids daily by RN staff.  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

## 2014-01-04 ENCOUNTER — Encounter (HOSPITAL_COMMUNITY): Payer: Self-pay | Admitting: Neurosurgery

## 2014-01-04 LAB — GLUCOSE, CAPILLARY
GLUCOSE-CAPILLARY: 216 mg/dL — AB (ref 70–99)
GLUCOSE-CAPILLARY: 189 mg/dL — AB (ref 70–99)
Glucose-Capillary: 170 mg/dL — ABNORMAL HIGH (ref 70–99)
Glucose-Capillary: 164 mg/dL — ABNORMAL HIGH (ref 70–99)

## 2014-01-04 LAB — CBC
HCT: 24.6 % — ABNORMAL LOW (ref 36.0–46.0)
Hemoglobin: 8.5 g/dL — ABNORMAL LOW (ref 12.0–15.0)
MCH: 28.8 pg (ref 26.0–34.0)
MCHC: 34.6 g/dL (ref 30.0–36.0)
MCV: 83.4 fL (ref 78.0–100.0)
PLATELETS: 148 10*3/uL — AB (ref 150–400)
RBC: 2.95 MIL/uL — ABNORMAL LOW (ref 3.87–5.11)
RDW: 14.8 % (ref 11.5–15.5)
WBC: 6.6 10*3/uL (ref 4.0–10.5)

## 2014-01-04 LAB — HEPARIN LEVEL (UNFRACTIONATED)
HEPARIN UNFRACTIONATED: 0.87 [IU]/mL — AB (ref 0.30–0.70)
Heparin Unfractionated: 0.43 IU/mL (ref 0.30–0.70)

## 2014-01-04 MED ORDER — HEPARIN (PORCINE) IN NACL 100-0.45 UNIT/ML-% IJ SOLN
2300.0000 [IU]/h | INTRAMUSCULAR | Status: DC
  Administered 2014-01-04: 2300 [IU]/h via INTRAVENOUS
  Filled 2014-01-04 (×2): qty 250

## 2014-01-04 MED ORDER — ENOXAPARIN SODIUM 150 MG/ML ~~LOC~~ SOLN
150.0000 mg | Freq: Two times a day (BID) | SUBCUTANEOUS | Status: DC
  Filled 2014-01-04 (×2): qty 1

## 2014-01-04 NOTE — Progress Notes (Signed)
[facility] Attending Daily Note:  Annabell Sabal MD  [phone] pager  Family Practice pager:  [phone] I have seen and examined this patient and have reviewed their chart. I have discussed this patient with the resident. I agree with the resident's findings, assessment and care plan.  Additionally:  Seen and examined with entire team.  Spirits good.  Consulting ENT.  Appreciate any input they have regarding thyroid cancer.   Alveda Reasons, MD 01/04/2014 4:08 PM

## 2014-01-04 NOTE — Progress Notes (Signed)
Family Medicine Teaching Service Daily Progress Note Intern Pager: [phone]  Patient name: Sarah [name] Medical record number: [id number] Date of birth: 10-Feb-1966 Age: 48 y.o. Gender: female  Primary Care Provider: No PCP Per Patient Consultants: Neurosurgery, IR, Oncology, Endocrinology Code Status: Full  Pt Overview and Major Events to Date:  - 9/22: Pt. Admitted with lower extremity weakness.  - 9/23: Pt. Found to have mass at T-3 level with spinal compression.  - 9/24: Surgery performed for decompression and biopsy overnight. Pt. Post-op and doing well. 3cm Suprahilar mass on CT Chest, PE also identified. ?Thyroid etiology, but much less likely for mets. Oncology consulted.  - 9/25 - 9/29: Awaiting pathology results with plan for oncology to see once path returns. Pt. With little neurological improvement. Started with light PT.  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 Oncology on board. Neurosurgery to comment on need for spinal stabilization surgery.  - 10/1: Radiology to perform thyroid US; Heterogeneous throughout, cannot r/o malignancy - 10/2: Thyroid Bx; results pending - 10/3-9: Decadron 64m BID >> 23mBID >> 62m38mID >> 62mg86m >> 0.5mg 90m>> DC (10/9) - 10/7: Thyroid bx results--suggestive of Hurthle Cell Carcinoma. - 10/8: Underwent spinal stabilization surg. - 10/10: Restarted heparin  - 10/13: Dr. GerkiHarlow Asale to perform surg as inpatient; will seek other consultations - 10/14: Dr. RosenConstance HolsterNT consulted. He will see pt on 10/15.  Assessment and Plan: VickiZOANNE [name] 48 y.42 female presenting with continued lower extremity weakness at baseline after discharge from hospital on 9/21. PMH is significant for obesity, HTN, and ?History of Hypothyroidism previously on Synthroid per patient.   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  Neurosurgery took her to the OR on 9/23, and biopsied as well as performed laminectomy for decompression. Surgical pathology with metastatic adenocarcinoma. Likely poor prognosis. At time of decompression, was thought to be most likely from lung mets. - Pain control:  Oxy IR prn. IV Morphine for breakthrough - Neurosurgery following: Patient was taken for spinal stabilization 10/9.  - Oncology following (Dr. MagriJana Hakimpatient; Dr. MohamJulien Nordmanntpatient); Neurosurgery (Dr. CabbeChristella Noadocrinology (Dr. Kerr)Buddy DutyT (Dr. RosenConstance Holster# Thyroid; Hurthle Cell Carcinoma.  - Large thyroid goiter noted. TSH normal; ultrasound on 10/1, Biopsy on 10/2 - FNA results: "clusters of neoplastic cells w/ granular/oncogenic cytoplasm--similar to previous bx" - Dr. RosenConstance Holsterbeen informed of pt. And has agreed to see patient on 10/15. Greatly appreciate the input.  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  - PT recommendations SNF with 24hour supervision. Will plan dispo based on goals of care discussion. - Per SW; SNF is an option, but will need estimated time of DC; pt currently would rather go home than to SNF but is willing to consider other options. - Foley catheter in place for urinary retention; Stooling regularly, no sxs of fecal retention.  - Discussed Recs for PT with Neurosurgery. Light PT at this point. Pt. Not to get UOB.  - TED hose has been ordered  #Pulmonary Embolus - Found on CT Chest  Pt. With brief episodes of tachycardia, but otherwise asymptomatic with O2 sats 100% on 2L [place] >> now on Room Air.  Blood pressures stable. Lower extremity Venous Duplex negative  for DVT.  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 ESR 59 (9/22)  - Now on IV heparin -- Will begin bridging as soon as possible (post surgical); waiting until thyroidectomy.  - Per pharm--recommendation for  long-term Lovenox; Difficult due to self-pay, CM is working on solution. If it is possible, Dose would be 1100m SQ q12h - Will continue to monitor VS. Stable at this time.  #Hyperglycemia -  Likely diabetic with PCOS given A1C of 6.7. CBG's 180's - 200s.  - Patient informed of elevated A1C and likely diagnosis of DMII. She agrees with uKoreathat this is a marginal issue at this point, and we will recommend treatment based upon our goals of care discussion.  - Continue to monitor.  - Resistant SSI; 15U Lantus.   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 PCOS vs. Adrenal patholog. Hypertensive and not hypotensive. Testing during this hospitalization after having recently received steroids would be largely non-diagnostic. Though, could also be a combination of isolated processes i.e. Clitoromegaly, Hirsutism 2/2 obesity. - Will leave for Outpatient f/u -- [place] assess ovaries vs 21-hydroxylase/ACTH/cortisol.  - TSH, Cortisol WNL - A1C : 6.7 - consistent with PCOS given evidence of cystic structures in pelvis.   # UTI, 2/2 foley catheter - UCx = Proteus  - dysuria and suprapubic pain noted PM 10/1. - Cipro 2559mBID (10/2 started) -- treated for 7 days -- DC 10/9.  #HTN- BP fairly well controlled -continue losartan.   #Obesity-  - consistent with potential PCOS.   FEN/GI: Reg diet; KVO; s/p Enema with some relief Prophylaxis: on full dose IV heparin, SCDs  Disposition: Pending Goals of Care discussion and endocrinology recommendations. Possible discharge early next week.  Subjective: Patient doing well this morning. No new developments overnight. Continues to have regular BMs. States her pain is a little better today. PT is working with her.   Objective: Temp:  [98 F (36.7 C)-99.1 F (37.3 C)] 99.1 F (37.3 C) (10/14 1416) Pulse Rate:  [89-110] 97 (10/14 1416) Resp:  [18-22] 20 (10/14 1416) BP: (102-120)/(50-65)  113/65 mmHg (10/14 1416) SpO2:  [95 %-100 %] 97 % (10/14 1416) Physical Exam: General: NAD, AAOx3, morbidly obese  HEENT: Thyromegaly / Goiter noted on exam without tenderness, and no nodules, no LAD, neck supple, J-line in place, lipoma/cyst on forehead Cardio: distant heart sounds Lungs: CTA without wheezes. Abdomen: Soft and nontender, mildly distended, No organomegaly noted.  Extremities: no edema,  No calf tenderness, Skin: No lesions or rashes noted Neuro: cranial nerves grossly normal. Normal speech; Neuro exam is at new baseline. No new changes. Sensation greatly diminished from umbilicus to toes. 0/5 strength bilaterally below waist. Otherwise no new sensory/motor deficits.  Laboratory:  Recent Labs Lab 01/02/14 0500 01/03/14 0759 01/04/14 0425  WBC 8.8 7.8 6.6  HGB 9.9* 9.3* 8.5*  HCT 28.4* 26.8* 24.6*  PLT 191 167 148*   No results found for this basename: NA, K, CL, CO2, BUN, CREATININE, CALCIUM, LABALBU, PROT, BILITOT, ALKPHOS, ALT, AST, GLUCOSE,  in the last 168 hours Urinalysis    Component Value Date/Time   COLORURINE AMBER* 12/23/2013 1058   APPEARANCEUR TURBID* 12/23/2013 1058   LABSPEC 1.018 12/23/2013 1058   PHURINE 7.5 12/23/2013 1058   GLUCOSEU NEGATIVE 12/23/2013 1058   HGBUR LARGE* 12/23/2013 1058   BILIRUBINUR NEGATIVE 12/23/2013 [place]/04/2013 1058   PROTEINUR 100* 12/23/2013 1058   UROBILINOGEN  0.2 12/23/2013 1058   NITRITE NEGATIVE 12/23/2013 1058   LEUKOCYTESUR LARGE* 12/23/2013 1058    Phos - 3.9 Mg - 2.2 CRP - 11.6 ESR - 59 CK - 56 B12 - 209 Random Cortisol - 25.4 A1C - 6.7 TSH - 2.13 RPR - nonreactive HIV - nonreactive UPT - Negative  Imaging/Diagnostic Tests:  CT Chest Abd/ Pelvis 9/23:  CLINICAL DATA: Destructive metastatic lesion of the T3 vertebral  body with no known primary carcinoma.  EXAM:  CT CHEST, ABDOMEN, AND PELVIS WITH CONTRAST  TECHNIQUE:  Multidetector CT imaging of the chest, abdomen and pelvis  was  performed following the standard protocol during bolus  administration of intravenous contrast.  CONTRAST: 151m OMNIPAQUE IOHEXOL 300 MG/ML SOLN  COMPARISON: MRI of the cervical spine earlier today.  FINDINGS:  CT CHEST FINDINGS  Within the chest, a right suprahilar mass measures approximately 3.0  x 2.7 x 2.2 cm. This either represents a nodal metastasis or central  lung mass such as small cell carcinoma. A number of smaller  mediastinal lymph nodes are also identified in the right  paratracheal region, prevascular space and AP window.  Destructive lesion of the T3 vertebral body noted which nearly  replaces the entire vertebral body with tumor extension posteriorly  into the epidural space causing significant compression of the  spinal cord. Paravertebral extension of tumor is also suspected  lateral and anterior to the vertebral body, right greater than left.  Massive enlargement of the thyroid gland present with transverse  dimensions of approximately 7.7 x 8.5 cm. The thyroid gland is  incompletely visualized as the neck was not included. Central  dystrophic calcification identified. The enlarged thyroid gland does  cause significant mass effect on the airway with evidence of  tracheal deviation to the right and tracheal narrowing at the  thoracic inlet. The enlarged thyroid gland extends into the superior  mediastinum.  There is evidence of pulmonary embolism with nonocclusive thrombus  identified in the right middle lobe and lower lobe pulmonary  arteries. Pulmonary arterial opacification is not optimal on the  standard venous phase evaluation of the chest but the right sided  pulmonary embolism is clearly present.  There are small bilateral pulmonary nodules. Right-sided pulmonary  nodule measuring 6 mm likely localizes to the very top of the lower  lobe near the major fissure. Left upper lobe nodule measures 7 mm.  Lingular nodule measures 7 mm. Left lower lobe nodule  measures 7 mm.  Faint peripheral right middle lobe nodule measures 3 mm. Inferior  right middle lobe nodule measures 6 mm. Several small nodules in the  posterior right upper lobe identified in the 3-4 mm range.  No evidence of pleural or pericardial fluid. No other bony lesions  identified in the thorax.  CT ABDOMEN AND PELVIS FINDINGS  Small low-density lesion in the left lobe of the liver measures 7 mm  and likely represents a cyst. No other hepatic abnormalities. The  gallbladder, pancreas, spleen, adrenal glands and kidneys are  unremarkable. No adenopathy is identified in the abdomen or pelvis.  Bowel shows normal caliber without evidence of obstruction, lesion  or inflammation. No abnormal fluid collections.  The bladder is moderately distended. Elongated cystic changes in  both adnexal regions identified which may represent a combination of  ovarian cysts and potentially hydrosalpinx. Correlation with pelvic  ultrasound is recommended. No hernias are identified. The uterus  appears unremarkable by CT. No bony lesions are identified in the  abdomen or pelvis.  IMPRESSION:  1. 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  2. 3 cm right suprahilar mass may represent nodal metastasis versus  primary central tumor. Next number multiple small bilateral  pulmonary nodules consistent with metastatic disease.  3. Acute nonocclusive pulmonary embolism in the right middle lobe  and right lower lobe pulmonary arteries.  4. Massive thyroid goiter causing tracheal deviation to the right  and narrowing of the trachea.  5. Tubular cystic abnormalities of both adnexal regions which may be  a combination of cystic changes in the ovaries and potentially  hydrosalpinx. Correlation recommended with pelvic ultrasound to  exclude any suspicious cystic lesions of the adnexae.   6. Critical Value/emergent results were called by telephone at the  time of interpretation on 12/14/2013 at 4:52 pm to Dr. Tamala Julian of the  [facility] Medicine Teaching Service, who verbally acknowledged these  results.  Electronically Signed  By: Aletta Edouard M.D.  On: 12/14/2013 16:53   MRI T-spine 9/23:  CLINICAL DATA: Lower extremity weakness. Worsening back pain.  Compressive upper thoracic mass partially visualized on cervical  spine MRI as well as chest CT with spinal cord compression.  EXAM:  MRI THORACIC SPINE WITHOUT CONTRAST  TECHNIQUE:  Multiplanar, multisequence MR imaging of the thoracic spine was  performed. No intravenous contrast was administered.  COMPARISON: Cervical spine MRI and chest CT earlier today  FINDINGS:  There is a T3 compression fracture with approximately 50% height  loss centrally. As described on cervical spine MRI and chest CT  earlier today, there is abnormal soft tissue replacing the majority  of the T3 vertebral body extending into the pedicles as well as into  the ventral epidural space with moderate cord compression. Epidural  extension is slightly greater on the right and extends into the  inferior aspect of the right T2-3 neural foramen as well as superior  aspect of the right T3-4 neural foramen. There is also abnormal  paravertebral soft tissue laterally and anteriorly as seen on CT.  Central ventral epidural material posterior to the T4 vertebral body  may represent tumor or hematoma. Mild T2 hyperintensity is present  in the spinal cord at the T3 level extending inferiorly to the T4-5  disc space level consistent with mild edema. Mild edema is present  in the posterior paraspinal soft tissues of the upper thoracic  spine. No other vertebral body lesions are identified.  There is a moderate-sized central disc extrusion at T7-8 which  contacts and mildly flattens the ventral spinal cord with mild  spinal stenosis. A small left central disc  protrusion is present at  T8-9 with at most minimal impression on the spinal cord. Massive  enlargement of the left thyroid lobe is partially visualized.  Mediastinal and lung findings are better demonstrated on recent  chest CT.  IMPRESSION:  1. T3 vertebral body mass with pathologic compression fracture and  epidural tumor resulting in moderate cord compression with evidence  of mild spinal cord edema.  2. T7-8 disc extrusion resulting in mild spinal stenosis.   MRI Head / C-Spine 9/23:  CLINICAL DATA: 48 year old female who presented with back pain and  lower extremity numbness, with continued inadequately explained  lower extremity weakness following Hospital discharge on 12/12/2013.  Initial encounter.  EXAM:  MRI HEAD WITHOUT AND WITH CONTRAST  MRI CERVICAL SPINE WITHOUT AND WITH CONTRAST  TECHNIQUE:  Multiplanar, multiecho pulse sequences  of the brain and surrounding  structures, and cervical spine, to include the craniocervical  junction and cervicothoracic junction, were obtained without and  with intravenous contrast.  CONTRAST: 20 mL MultiHance.  COMPARISON: Lumbar MRI 12/10/2013.  FINDINGS:  MRI HEAD FINDINGS  Cerebral volume is normal. No restricted diffusion to suggest acute  infarction. No midline shift, mass effect, evidence of mass lesion,  ventriculomegaly, extra-axial collection or acute intracranial  hemorrhage. Cervicomedullary junction and pituitary are within  normal limits. Major intracranial vascular flow voids are preserved.  Pearline Cables and white matter signal is within normal limits for age  throughout the brain. No abnormal enhancement identified, pulsation  artifact prominent on axial post-contrast images.  Large body habitus. Small right frontal scalp lipoma (series 10,  image 37). Visible internal auditory structures appear normal. Mild  right mastoid fluid. Minor paranasal sinus mucosal thickening.  Visualized orbit soft tissues are within normal  limits. Partially  visible thyromegaly greater on the left, see below. Normal bone  marrow signal.  MRI CERVICAL SPINE FINDINGS  Large body habitus resulting in wrap artifact on axial images.  However, there is massive thyroid enlargement evident, with the left  lobe measuring at least 7.2 x 5.2 x 11.2 cm (AP by transverse by  CC). The trachea is displaced to the right at the thoracic inlet and  mildly compressed. There is other regional mass effect including  sheath. On the left carotid  Fairly preserved cervical lordosis. No marrow edema or evidence of  acute osseous abnormality. Cervicomedullary junction is within  normal limits. Spinal cord signal is within normal limits at all  visualized levels. No abnormal intradural enhancement.  C2-C3: Negative.  C3-C4: Disc osteophyte complex, facet and ligament flavum  hypertrophy. Along with a degree of congenital canal narrowing,  there is mild spinal stenosis and moderate biforaminal stenosis.  C4-C5: Mild uncovertebral and facet hypertrophy. Mild right C5  foraminal stenosis.  C5-C6: Circumferential disc osteophyte complex. Along with a degree  of congenital canal narrowing there is mild spinal stenosis.  Moderate right C6 foraminal stenosis.  C6-C7: Negative except for mild facet hypertrophy.  C7-T1: Negative.  T1-T2: Negative.  Faintly visualized at the T3 level in the upper thoracic spine there  is a lobulated solidly enhancing mass which appears to extend into  the epidural space from the T3 vertebral body. This is inseparable  from the spinal cord which is likely compressed. See series 2000  image 7 and series 1500, image 6. This is not included on axial  images.  IMPRESSION:  1. Faintly visible but aggressive appearing enhancing mass at the T3  level of the upper thoracic spine, likely with spinal cord  compression evaluate further are with thoracic MRI without and with  contrast.  2. Normal for age MRI appearance of the  brain.  3. Massive thyroid enlargement, greater on the left. Left thyroid  goiter/mass measuring at least 7.2 x 5.2 x 11.2 cm. Mass effect at  the thoracic inlet including on the trachea.  4. Mild chronic disc and endplate degeneration in the cervical spine  at C3-C4 and C5-C6 resulting in borderline to mild spinal stenosis,  but no cervical spinal cord mass effect or signal abnormality.  Electronically Signed:  By: Lars Pinks M.D.  On: 12/14/2013 09:50   MRI L-Spine 9/19:  CLINICAL DATA: Back pain with bilateral leg numbness, worsening  with oral steroids.  EXAM:  MRI LUMBAR SPINE WITHOUT CONTRAST  TECHNIQUE:  Multiplanar, multisequence MR imaging of the lumbar spine  was  performed. No intravenous contrast was administered.  COMPARISON: CT 12/09/2013  FINDINGS:  There is no degenerative disease at L2-3 or above. The discs are  normal in signal characteristics and morphology. The spinal canal  and foramina are widely patent. The distal cord and conus are normal  with conus tip at L1. There are small benign and insignificant  hemangiomas within the L2 and L3 vertebral bodies.  L3-4: Minimal desiccation of the disc. Mild circumferential bulging  slightly more prominent towards the left. Bilateral facet  degeneration and hypertrophy. Mild narrowing of the lateral recesses  without apparent neural compression. Mild foraminal narrowing on the  left without apparent neural compression.  L4-5: Desiccation of the disc with circumferential protrusion.  Bilateral facet and ligamentous hypertrophy. Stenosis of both  lateral recesses without definite focal neural compression. Mild  foraminal narrowing bilaterally.  L5-S1: Desiccation of the disc with loss of height. Endplate  osteophytes and bulging of the disc more prominent towards the left.  Bilateral facet degeneration and hypertrophy left more than right.  Stenosis of the subarticular lateral recess on the left that could  compress the  S1 nerve root. Mild foraminal narrowing left more than  right but without apparent compression of the exiting L5 nerve  roots.  Serpiginous fluid-filled structure in the left pelvis could  represent hydrosalpinx. This is not primarily or completely imaged.  IMPRESSION:  Degenerative disc disease and degenerative facet disease in the  lower lumbar spine that could be a cause of lumbago.  Lateral recess and foraminal narrowing at L3-4, L4-5 and L5-S1 as  described above that could be a cause of focal neural compression.  In general, this is more pronounced on the left than on the right.  In particular, there is potential for left S1 nerve root compression  in the subarticular lateral recess at L5-S1.  Electronically Signed  By: Nelson Chimes M.D.  On: 12/10/2013 08:45   CT L-Spine 9/18:  CLINICAL DATA: Low back pain with difficulty walking.  EXAM:  CT LUMBAR SPINE WITHOUT CONTRAST  TECHNIQUE:  Multidetector CT imaging of the lumbar spine was performed without  intravenous contrast administration. Multiplanar CT image  reconstructions were also generated.  COMPARISON: None.  FINDINGS:  There is no fracture or spondylolisthesis. There is moderately  severe disc space narrowing at L5-S1 with slight vacuum phenomenon  at this level. There is moderate narrowing at L4-5. There are  prominent anterior osteophytes at L2, L3, L4, and L5.  At T12-L1, there is moderate facet hypertrophy bilaterally. There is  no nerve root edema or effacement. No disc extrusion or stenosis.  At L1-2, there is moderate facet hypertrophy bilaterally. There is  mild diffuse disc bulging. There is no nerve root edema or  effacement. No disc extrusion or stenosis.  At L2-3, there is broad-based disc bulging and moderate facet  hypertrophy bilaterally. There is borderline narrowing of the thecal  sac with borderline spinal stenosis. No disc extrusion.  At L3-4, there is severe facet osteoarthritic change  bilaterally,  more pronounced on the left than on the right. There is broad-based  disc bulging and moderate ligamentum flavum hypertrophy. These  findings in concert lead to moderate generalized spinal stenosis.  At L4-5, there is broad-based disc protrusion and severe facet  osteoarthritic change bilaterally. These findings in concert lead to  moderate generalized spinal stenosis. No frank disc extrusion.  At L5-S1, there is severe facet osteoarthritic change on the left  and moderate facet osteoarthritic change on  the right. There is  broad-based disc protrusion. There is exit foraminal narrowing  bilaterally, more severe on the left than on the right, due to  osteophytic change. No frank disc extrusion or stenosis is seen at  this level.  There are no paraspinous lesions.  IMPRESSION:  Spinal stenosis at L3-4 and L4-5, multifactorial. Borderline spinal  stenosis at L2-3. There does appear to be a degree of congenital  narrowing of the canal which contributes to spinal stenosis.  Multilevel arthropathy.  No frank disc extrusion.  No fracture or spondylolisthesis.  Electronically Signed  By: Lowella Grip M.D.  On: 12/09/2013 13:50   CXR 9/13:  CLINICAL DATA: Upper back pain for 5 weeks. Dyspnea on exertion.  Nonsmoker. History of hypertension.  EXAM:  CHEST 2 VIEW  COMPARISON: 04/15/2006  FINDINGS:  The heart is mildly enlarged. There is elevation of right  hemidiaphragm, stable in appearance. There is streaky density  favored to be within the right lower lobe, consistent with  atelectasis or infiltrate. No pulmonary edema. No pleural effusions.  There are moderate degenerative changes in the thoracic spine.  IMPRESSION:  Right lower lobe atelectasis or infiltrate.  Electronically Signed  By: Shon Hale M.D.  On: 12/04/2013 19:26  Venous Duplex 9/24:  Summary:  - No evidence of deep vein thrombosis involving the right lower extremity. - No evidence of deep vein  thrombosis involving the left lower extremity.   Surgical Pathology Report: 12/21/2013 Diagnosis Soft tissue mass, simple excision, Thoracic Epidural Tumor - METASTATIC CARCINOMA. PLEASE SEE COMMENT. Microscopic Comment There are clusters of malignant cells with abundant eosinophilic cytoplasm. A battery of immunostains were performed and the tumor cells show the following profile: Cytokeratin 7 - strongly positive Cytokeratin 8/18 - strongly positive TTF-1 - strongly positive. Thyroglobulin - negative CD10 - negative Cytokeratin 20 - negative S100 - negative Vimentin - negative The control stained appropriately. The overall findings are diagnostic for metastatic carcinoma. 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 However, a metastatic oncocytic thyroid carcinoma is still in the differential. Clinical and radiographic correlation is highly recommended. (HCL:kh 12-19-13) Aldona Bar MD Pathologist, Electronic Signature (Case signed 12/20/2013)  Elberta Leatherwood, MD 01/04/2014, 3:27 PM PGY-1, [place] Intern pager: [phone], text pages welcome

## 2014-01-04 NOTE — Progress Notes (Signed)
Patient ID: Sarah [name], female   DOB: 05/31/1965, 48 y.o.   MRN: [id number] BP 120/56  Pulse 102  Temp(Src) 99.1 F (37.3 C) (Oral)  Resp 20  Ht 5\' 11"  (1.803 m)  Wt 142.4 kg (313 lb 15 oz)  BMI 43.80 kg/m2  SpO2 98%  LMP 11/28/2013 I have consulted Dr. Constance Holster of ENT about the thyroidectomy which is necessary. He will see Sarah [name] tomorrow. No change in exam Continue therapy

## 2014-01-04 NOTE — Progress Notes (Signed)
ANTICOAGULATION CONSULT NOTE - Follow Up Consult  Pharmacy Consult for heparin Indication: pulmonary embolus  Labs:  Recent Labs  01/02/14 0500  01/03/14 0759  01/03/14 2158 01/04/14 0425 01/04/14 0625 01/04/14 1504  HGB 9.9*  --  9.3*  --   --  8.5*  --   --   HCT 28.4*  --  26.8*  --   --  24.6*  --   --   PLT 191  --  167  --   --  148*  --   --   HEPARINUNFRC 0.22*  < > <0.10*  < > 0.17*  --  0.87* 0.43  < > = values in this interval not displayed.   Assessment: 48yo female therapeutic on heparin after rate decrease.  Heparin for RLL PE.  To have thyroidectomy. HL is 0.43 on 2300 units/hr.  No bleeding reported.   Goal of Therapy:  Heparin level 0.3-0.7 units/ml   Plan:  Continue heparin at 2300 units/hr Daily HL and CBC To begin bridging after thyroidectomy  Eudelia Bunch, Pharm.D. [phone] 01/04/2014 4:38 PM

## 2014-01-04 NOTE — Progress Notes (Signed)
ANTICOAGULATION CONSULT NOTE - Follow Up Consult  Pharmacy Consult for heparin Indication: pulmonary embolus  Labs:  Recent Labs  01/02/14 0500  01/03/14 0759 01/03/14 1443 01/03/14 2158 01/04/14 0425 01/04/14 0625  HGB 9.9*  --  9.3*  --   --  8.5*  --   HCT 28.4*  --  26.8*  --   --  24.6*  --   PLT 191  --  167  --   --  148*  --   HEPARINUNFRC 0.22*  < > <0.10* 0.22* 0.17*  --  0.87*  < > = values in this interval not displayed.   Assessment: 48yo female now supratherapeutic on heparin after rate increase.  Goal of Therapy:  Heparin level 0.3-0.7 units/ml   Plan:  Will change heparin gtt rate to between the last two rates at 2300 units/hr and check level in 6hr.  Wynona Neat, PharmD, BCPS  01/04/2014,7:35 AM

## 2014-01-04 NOTE — Progress Notes (Deleted)
ANTICOAGULATION CONSULT NOTE - Follow Up Consult  Pharmacy Consult for heparin Indication: pulmonary embolus  Labs:  Recent Labs  01/02/14 0500  01/03/14 0759 01/03/14 1443 01/03/14 2158 01/04/14 0425 01/04/14 0625  HGB 9.9*  --  9.3*  --   --  8.5*  --   HCT 28.4*  --  26.8*  --   --  24.6*  --   PLT 191  --  167  --   --  148*  --   HEPARINUNFRC 0.22*  < > <0.10* 0.22* 0.17*  --  0.87*  < > = values in this interval not displayed.  Assessment: 48yo female on heparin for PE.  She has metastatic NSCLC.  She has no bleeding reported although a slight decrease in Hg has occurred since her surgery. Decision was made to change heparin to lovenox.  Goal of Therapy:  antiXa level 0.6-1.0 4 hours after dose   Plan:  Lovenox 150 mg sq q12 hours Check CBC q 3 days. Monitor for bleeding complications  Ayme Short, Pharm D 01/04/2014,11:02 AM

## 2014-01-04 NOTE — Progress Notes (Signed)
Physical Therapy Treatment Patient Details Name: Sarah [name] MRN: [id number] DOB: 03-20-1966 Today's Date: 01/04/2014    History of Present Illness 48 y.o. female admitted to [facility] on 12/12/13 with progressive lower extremity weakness and mid to upper back pain.  Pt was found to have a mass at T3 with (+) spinal cord compression.  On 9/23 pt underwent surgery for decompression and biopsy.  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  Her thyroid is very enlarged.  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 Pt is NOW S/P T1-T5 postlat arthrodesis allograft, T1-T5 segmental pedicle screw fixation on 12/30/13.  New therapy orders with updated activity orders written on 12/30/13.    PT Comments    Pt is progressing well with her therapy despite not feeling well today.  She was able to tolerate OOB to [facility] for the first time and was able to start trying to self propel (with significant assist).  Pt was able to get out of the room for some time with her sister before returning to go back to bed.  Pt has consistently had a BM around 1-2 pm every day for the past two days.  I encouraged RN staff to plan on peri care every day at that time and PT will plan to either check with RN to make sure it is done before coming or avoid 1-2 pm for treatment time.   Follow Up Recommendations  CIR     Equipment Recommendations  Wheelchair (measurements PT);Wheelchair cushion (measurements PT);Hospital bed;Other (comment);3in1 (PT) (hoyer lift drop arm 3 in 1)    Recommendations for Other Services Rehab consult     Precautions / Restrictions Precautions Precautions: Fall Precaution Comments: bil leg weakness, paraplegia Restrictions Weight Bearing Restrictions: No    Mobility  Bed Mobility Overal bed mobility: Needs Assistance Bed Mobility: Rolling Rolling: +2 for physical assistance;Mod assist;Max assist         General bed  mobility comments: Two person mod to max assist to roll today.  Pt needs assist to get outside leg positioned in flexion and initial roll so that she can reach the bed rail to help control her upper body. Rolled at least 10 times during session due to peri care.  Pt seems to be consistantly having a BM in the afternoons after lunch (~3-4 hours after her medication to make her go).  I spoke with RN about a regular bowel schedule and based on the last two days it seems at though peri care should be addressed around 1-2 pm.  Therapy will do our best to call and make sure this is done prior to our arrival or maybe arrange to do her sessions in the AM when she doesn't traditionally have a BM.   Transfers Overall transfer level: Needs assistance               General transfer comment: used maxi move for OOB to [facility] as there was only 2 person assist today and 3-4 are needed for safety at attempts at EOB.                      Information systems manager mobility: Yes Wheelchair propulsion: Both upper extremities Wheelchair parts: Needs assistance Distance: 20 Wheelchair Assistance Details (indicate cue type and reason): Max assist to help get enough power to propel a short distance down the hall.  Wheels are too posterior in the chair we used and pt not attempting to lean forward either.              Cognition Arousal/Alertness: Awake/alert Behavior During Therapy: WFL for tasks assessed/performed Overall Cognitive Status: Within Functional Limits for tasks assessed                         General Comments General comments (skin integrity, edema, etc.): BPs taken and knee high TEDs donned for session today.  See vitals section for details.       Pertinent Vitals/Pain Pain Assessment: 0-10 Pain Score: 3  Pain Location: surgical site Pain Descriptors / Indicators: Aching Pain Intervention(s): Limited activity within patient's tolerance;Monitored  during session;Repositioned;Premedicated before session           PT Goals (current goals can now be found in the care plan section) Acute Rehab PT Goals Patient Stated Goal: to go home Progress towards PT goals: Progressing toward goals    Frequency  Min 5X/week    PT Plan Current plan remains appropriate       End of Session   Activity Tolerance: Patient limited by pain;Patient limited by fatigue Patient left: with call bell/phone within reach;in bed;with family/visitor present     Time: 3710-6269 PT Time Calculation (min): 78 min  Charges:  $Therapeutic Activity: 68-82 mins                      Cordarious Zeek B. Junita Kubota, PT, DPT [phone]   01/04/2014, 2:48 PM

## 2014-01-04 NOTE — Progress Notes (Signed)
Met w Sarah [name] and her sister to discuss the overall situation and plan. She did well with her spine stabilization surgery and has been scheduled to see Dr Ledon Snare OCT 26 to discuss adjuvant radiation to that area.  Per Dr Cindra Eves very thorough note, next step is thyroidectomy-- is there a date set for that surgery?  Patient will be on TSH suppression, and 6-8 weeks before proceeding to radioiodine therapy.  Chemotherapy has little role in this situation. Accordingly I will follow peripherally. Please let me know if I can be of further help.

## 2014-01-05 ENCOUNTER — Other Ambulatory Visit (INDEPENDENT_AMBULATORY_CARE_PROVIDER_SITE_OTHER): Payer: Self-pay

## 2014-01-05 LAB — GLUCOSE, CAPILLARY
GLUCOSE-CAPILLARY: 204 mg/dL — AB (ref 70–99)
Glucose-Capillary: 223 mg/dL — ABNORMAL HIGH (ref 70–99)
Glucose-Capillary: 172 mg/dL — ABNORMAL HIGH (ref 70–99)
Glucose-Capillary: 184 mg/dL — ABNORMAL HIGH (ref 70–99)

## 2014-01-05 LAB — CBC
HCT: 24.1 % — ABNORMAL LOW (ref 36.0–46.0)
HEMOGLOBIN: 8.1 g/dL — AB (ref 12.0–15.0)
MCH: 28.2 pg (ref 26.0–34.0)
MCHC: 33.6 g/dL (ref 30.0–36.0)
MCV: 84 fL (ref 78.0–100.0)
Platelets: 137 10*3/uL — ABNORMAL LOW (ref 150–400)
RBC: 2.87 MIL/uL — ABNORMAL LOW (ref 3.87–5.11)
RDW: 14.6 % (ref 11.5–15.5)
WBC: 5.5 10*3/uL (ref 4.0–10.5)

## 2014-01-05 LAB — COMPREHENSIVE METABOLIC PANEL
ALT: 38 U/L — ABNORMAL HIGH (ref 0–35)
AST: 32 U/L (ref 0–37)
Albumin: 1.7 g/dL — ABNORMAL LOW (ref 3.5–5.2)
Alkaline Phosphatase: 273 U/L — ABNORMAL HIGH (ref 39–117)
Anion gap: 10 (ref 5–15)
BUN: 9 mg/dL (ref 6–23)
CALCIUM: 8 mg/dL — AB (ref 8.4–10.5)
CO2: 26 meq/L (ref 19–32)
CREATININE: 0.71 mg/dL (ref 0.50–1.10)
Chloride: 95 mEq/L — ABNORMAL LOW (ref 96–112)
GFR calc Af Amer: >90 mL/min (ref >90)
Glucose, Bld: 150 mg/dL — ABNORMAL HIGH (ref 70–99)
Potassium: 3.8 mEq/L (ref 3.7–5.3)
Sodium: 131 mEq/L — ABNORMAL LOW (ref 137–147)
Total Bilirubin: 1 mg/dL (ref 0.3–1.2)
Total Protein: 5.1 g/dL — ABNORMAL LOW (ref 6.0–8.3)

## 2014-01-05 LAB — HEPARIN LEVEL (UNFRACTIONATED)
Heparin Unfractionated: 0.36 IU/mL (ref 0.30–0.70)
Heparin Unfractionated: 0.29 IU/mL — ABNORMAL LOW (ref 0.30–0.70)

## 2014-01-05 LAB — RETICULOCYTES
RBC.: 2.92 MIL/uL — ABNORMAL LOW (ref 3.87–5.11)
RETIC COUNT ABSOLUTE: 61.3 10*3/uL (ref 19.0–186.0)
RETIC CT PCT: 2.1 % (ref 0.4–3.1)

## 2014-01-05 MED ORDER — HEPARIN (PORCINE) IN NACL 100-0.45 UNIT/ML-% IJ SOLN
2500.0000 [IU]/h | INTRAMUSCULAR | Status: DC
  Administered 2014-01-05: 2300 [IU]/h via INTRAVENOUS
  Administered 2014-01-06: 2500 [IU]/h via INTRAVENOUS
  Administered 2014-01-06: 2400 [IU]/h via INTRAVENOUS
  Administered 2014-01-06 – 2014-01-08 (×6): 2500 [IU]/h via INTRAVENOUS
  Filled 2014-01-05 (×7): qty 250

## 2014-01-05 MED ORDER — HEPARIN BOLUS VIA INFUSION
2000.0000 [IU] | Freq: Once | INTRAVENOUS | Status: AC
  Administered 2014-01-05: 2000 [IU] via INTRAVENOUS
  Filled 2014-01-05: qty 2000

## 2014-01-05 NOTE — Progress Notes (Signed)
ANTICOAGULATION CONSULT NOTE - Follow Up Consult  Pharmacy Consult for heparin Indication: pulmonary embolus  Labs:  Recent Labs  01/03/14 0759  01/04/14 0425 01/04/14 0625 01/04/14 1504 01/05/14 0715  HGB 9.3*  --  8.5*  --   --  8.1*  HCT 26.8*  --  24.6*  --   --  24.1*  PLT 167  --  148*  --   --  137*  HEPARINUNFRC <0.10*  < >  --  0.87* 0.43 0.36  CREATININE  --   --   --   --   --  0.71  < > = values in this interval not displayed.   Assessment: 48yo female to restart heparin after held this am for potential thyroid surgery.  Surgery will be next week.   Heparin for RLL PE.  Heparin level was therapeutic this am on 2300 units/hr  Goal of Therapy:  Heparin level 0.3-0.7 units/ml   Plan:  Restart heparin at previous rate after 2000 unit bolus. Check heparin level 6 hours after restart  Thanks for allowing pharmacy to be a part of this patient's care.  Excell Seltzer, PharmD Clinical Pharmacist, [phone] 01/05/2014 2:00 PM

## 2014-01-05 NOTE — Progress Notes (Signed)
Physical Therapy Treatment Patient Details Name: Sarah [name] MRN: [id number] DOB: Aug 11, 1965 Today's Date: 01/05/2014    History of Present Illness 48 y.o. female admitted to [facility] on 12/12/13 with progressive lower extremity weakness and mid to upper back pain.  Pt was found to have a mass at T3 with (+) spinal cord compression.  On 9/23 pt underwent surgery for decompression and biopsy.  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  Her thyroid is very enlarged.  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 Pt is NOW S/P T1-T5 postlat arthrodesis allograft, T1-T5 segmental pedicle screw fixation on 12/30/13.  New therapy orders with updated activity orders written on 12/30/13.    PT Comments    Pt is progressing well with her mobility, but seems a bit "down" today emotionally.  She would really benefit from some counseling or neuropsych as she is likely realizing some of the permanence of her condition (and stressing over impending surgery on Monday).  PT will continue to follow acutely and attempt to progress mobility as able.    Follow Up Recommendations  CIR     Equipment Recommendations  Wheelchair (measurements PT);Wheelchair cushion (measurements PT);Hospital bed;Other (comment);3in1 (PT) (hoyer lift, slide bard, drop arm BSC)    Recommendations for Other Services Rehab consult     Precautions / Restrictions Precautions Precautions: Fall Precaution Comments: bil leg weakness, paraplegia    Mobility  Bed Mobility Overal bed mobility: Needs Assistance Bed Mobility: Rolling Rolling: +2 for physical assistance;Max assist         General bed mobility comments: Pt is two person max assist to roll as pt needs help positioning her outer leg in flexion and rotating trunk and shoulders until she can reach bedrail to assist in rolling.  Once on her side, pt can stabilize there without assist.    Transfers Overall transfer level: Needs assistance               General transfer comment: Maxi move lift used for OOB to WC .               Information systems manager mobility: Yes Wheelchair propulsion: Both upper extremities Wheelchair parts: Needs assistance Distance: 65 Wheelchair Assistance Details (indicate cue type and reason): mod assist to help steer RW in long, wide hallway.  Verbal cues that if she reaches back further on the rims of the [facility] she could get a better/stronger push.  Pt is fearful of falling forward out of the WC, so applied gait belt x 2 around her while she was up in the [facility].             Cognition Arousal/Alertness: Awake/alert Behavior During Therapy: Flat affect (more flat today than usual) Overall Cognitive Status: Within Functional Limits for tasks assessed                         General Comments General comments (skin integrity, edema, etc.): BPs taken during session and were stable, but still donned knee high TED hose and SCDs when back in the room and staying in sitting.       Pertinent Vitals/Pain Pain Assessment: 0-10 Pain Score: 3  Pain Location: surgical site Pain Descriptors / Indicators: Aching;Burning Pain Intervention(s): Limited activity within patient's tolerance;Monitored during session;Repositioned           PT Goals (current goals  can now be found in the care plan section) Acute Rehab PT Goals Patient Stated Goal: to go home Progress towards PT goals: Progressing toward goals    Frequency  Min 5X/week    PT Plan Current plan remains appropriate       End of Session Equipment Utilized During Treatment: Gait belt Activity Tolerance: Patient limited by pain;Patient limited by fatigue Patient left: with family/visitor present;in chair;with call bell/phone within reach     Time: 2536-6440 PT Time Calculation (min): 40 min  Charges:  $Therapeutic Activity: 23-37  mins $Wheel Chair Management: 8-22 mins                      Sarah [name] B. Houa Ackert, PT, DPT [phone]   01/05/2014, 4:29 PM

## 2014-01-05 NOTE — Progress Notes (Signed)
Patient ID: Sarah [name], female   DOB: 1966-02-06, 48 y.o.   MRN: [id number] BP 116/68  Pulse 103  Temp(Src) 99.1 F (37.3 C) (Oral)  Resp 18  Ht 5\' 11"  (1.803 m)  Wt 142.4 kg (313 lb 15 oz)  BMI 43.80 kg/m2  SpO2 97%  LMP 11/28/2013 For or next week. Exam otherwise unchanged.  Appreciate Dr. Janeice Robinson assistance.  No change upper extremities

## 2014-01-05 NOTE — Progress Notes (Signed)
Chaplain initiated follow up with pt and family. Pt told chaplain she had a long day yesterday and this morning. Additionally she informed chaplain about her upcoming surgery and what that means for her. Pt and family are currently weighing out options for the placement of rehab after discharge. They recognize a desire to go home, but also recognize the benefits of a facility that could provided more attention and especially more rehab. Chaplain will continue to follow.   01/05/14 1100  Clinical Encounter Type  Visited With Patient and family together  Visit Type Follow-up;Spiritual support  Spiritual Encounters  Spiritual Needs Emotional  Stress Factors  Patient Stress Factors Major life changes;Health changes  Zoye Chandra, Barbette Hair, Chaplain 01/05/2014 11:46 AM

## 2014-01-05 NOTE — Progress Notes (Signed)
OT Cancellation Note  Patient Details Name: Sarah [name] MRN: [id number] DOB: [birth year]   Cancelled Treatment:    Reason Eval/Treat Not Completed: Patient declined, no reason specified ("me moment") Pt with surgeon arriving this AM and informing of the need to remove thyroid next week. Pt having emotional moment and asking that therapy attempt in the PM hours. Patient encouraged that OOB could help to be allowed to go outside this AM and politely declined. OT to reattempt as time allows.    Peri Maris Pager: [phone]  01/05/2014, 10:10 AM

## 2014-01-05 NOTE — Progress Notes (Signed)
UR complete.  Tiffannie Sloss RN, MSN 

## 2014-01-05 NOTE — Progress Notes (Signed)
OT Cancellation Note  Patient Details Name: Sarah [name] MRN: [id number] DOB: 1965-07-16   Cancelled Treatment:    Reason Eval/Treat Not Completed: Patient declined, no reason specified  Peri Maris Pager: [phone]  01/05/2014, 1:31 PM

## 2014-01-05 NOTE — Progress Notes (Signed)
Inpatient Diabetes Program Recommendations  AACE/ADA: New Consensus Statement on Inpatient Glycemic Control (2013)  Target Ranges:  Prepandial:   less than 140 mg/dL      Peak postprandial:   less than 180 mg/dL (1-2 hours)      Critically ill patients:  140 - 180 mg/dL   Consider adding Novolog prandial dose 4 units TID with meals per Glycemic Control order-set.  Thank you  Raoul Pitch BSN, RN,CDE Inpatient Diabetes Coordinator [phone] (team pager)

## 2014-01-05 NOTE — Progress Notes (Signed)
ANTICOAGULATION CONSULT NOTE - Follow Up Consult  Pharmacy Consult for heparin Indication: pulmonary embolus  Allergies  Allergen Reactions  . Tramadol Nausea And Vomiting    Patient Measurements: Height: 5\' 11"  (180.3 cm) Weight: 313 lb 15 oz (142.4 kg) IBW/kg (Calculated) : 70.8 Heparin Dosing Weight: 105kg  Vital Signs: Temp: 99.4 F (37.4 C) (10/15 1740) Temp Source: Oral (10/15 1740) BP: 116/67 mmHg (10/15 1740) Pulse Rate: 98 (10/15 1740)  Labs:  Recent Labs  01/03/14 0759  01/04/14 0425  01/04/14 1504 01/05/14 0715 01/05/14 2103  HGB 9.3*  --  8.5*  --   --  8.1*  --   HCT 26.8*  --  24.6*  --   --  24.1*  --   PLT 167  --  148*  --   --  137*  --   HEPARINUNFRC <0.10*  < >  --   < > 0.43 0.36 0.29*  CREATININE  --   --   --   --   --  0.71  --   < > = values in this interval not displayed.  Estimated Creatinine Clearance: 136.4 ml/min (by C-G formula based on Cr of 0.71).   Medications:  Heparin @ 2300units/hr  Assessment: 48yo female to restart heparin for RLL PE after it was held this morning for potential thyroid surgery-Surgery will be next week. She was resumed on 2300 units/hr- previously therapeutic on this rate. This evening, her heparin level is just below goal at 0.29 units/mL. No bleeding noted, Hgb and plts have dropped over the past couple days- primary team aware and continuing to watch, low HIT score.  Goal of Therapy:  Heparin level 0.3-0.7 units/ml Monitor platelets by anticoagulation protocol: Yes   Plan:   1. Increase heparin slightly to 2400 units/hr 2. Next HL with AM labs; daily HL and CBC 3. Follow for s/s bleeding  Temeca Somma D. Taggart Prasad, PharmD, BCPS Clinical Pharmacist Pager: [phone] 01/05/2014 9:41 PM

## 2014-01-05 NOTE — Progress Notes (Signed)
FMTS Attending Daily Note:  Annabell Sabal MD  [phone] pager  Family Practice pager:  [phone] I have discussed this patient with the resident Dr. Alease Frame.  I agree with their findings, assessment, and care plan.  Sounds like surgery will be Monday for thyroidectomy.  Continue heparin until Sunday night, short-half life, can stop at that point.  All cell lines dropping, including Hgb.  Watch for further drop.  FOBT pending.  Checking reticulocytes to further investigate.  No active signs of bleeding currently.  Low score for HIIT despite dropping platelets.

## 2014-01-05 NOTE — Progress Notes (Signed)
Family Medicine Teaching Service Daily Progress Note Intern Pager: [phone]  Patient name: Sarah [name] Medical record number: [id number] Date of birth: July 13, 1965 Age: 48 y.o. Gender: female  Primary Care Provider: No PCP Per Patient Consultants: Neurosurgery, IR, Oncology, Endocrinology Code Status: Full  Pt Overview and Major Events to Date:  - 9/22: Pt. Admitted with lower extremity weakness.  - 9/23: Pt. Found to have mass at T-3 level with spinal compression.  - 9/24: Surgery performed for decompression and biopsy overnight. Pt. Post-op and doing well. 3cm Suprahilar mass on CT Chest, PE also identified. ?Thyroid etiology, but much less likely for mets. Oncology consulted.  - 9/25 - 9/29: Awaiting pathology results with plan for oncology to see once path returns. Pt. With little neurological improvement. Started with light PT.  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 Oncology on board. Neurosurgery to comment on need for spinal stabilization surgery.  - 10/1: Radiology to perform thyroid US; Heterogeneous throughout, cannot r/o malignancy - 10/2: Thyroid Bx; results pending - 10/3-9: Decadron 2m BID >> 240mBID >> 42m7mID >> 42mg57m >> 0.5mg 9m>> DC (10/9) - 10/7: Thyroid bx results--suggestive of Hurthle Cell Carcinoma. - 10/8: Underwent spinal stabilization surg. - 10/10: Restarted heparin  - 10/13: Dr. GerkiHarlow Asale to perform surg as inpatient; will seek other consultations - 10/14: Dr. RosenConstance HolsterNT consulted. He will see pt on 10/15.  Assessment and Plan: Sarah [name] 48 y.60 female presenting with continued lower extremity weakness at baseline after discharge from hospital on 9/21. PMH is significant for obesity, HTN, and ?History of Hypothyroidism previously on Synthroid per patient.   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  Neurosurgery took her to the OR on 9/23, and biopsied as well as performed laminectomy for decompression. Surgical pathology with metastatic adenocarcinoma. Likely poor prognosis. At time of decompression, was thought to be most likely from lung mets. - Pain control:  Oxy IR prn. IV Morphine for breakthrough - Neurosurgery following: Patient was taken for spinal stabilization 10/9.  - Oncology following (Dr. MagriJana Hakimpatient; Dr. MohamJulien Nordmanntpatient); Neurosurgery (Dr. CabbeChristella Noadocrinology (Dr. Kerr)Buddy DutyT (Dr. RosenConstance Holster# Thyroid; Hurthle Cell Carcinoma.  - Large thyroid goiter noted. TSH normal; ultrasound on 10/1, Biopsy on 10/2 - FNA results: "clusters of neoplastic cells w/ granular/oncogenic cytoplasm--similar to previous bx" - Dr. RosenConstance Holsterbeen informed of pt. And has agreed to see patient on 10/15 (today). Greatly appreciate the input.  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  - PT recommendations SNF with 24hour supervision. Will plan dispo based on goals of care discussion. - Per SW; SNF is an option, but will need estimated time of DC; pt currently would rather go home than to SNF but is willing to consider other options. - Foley catheter in place for urinary retention; Stooling regularly, no sxs of fecal retention.  - Discussed Recs for PT with Neurosurgery. Light PT at this point. Pt. Not to get UOB.  - TED hose  #Pulmonary Embolus - Found on CT Chest  Pt. With brief episodes of tachycardia, but otherwise asymptomatic with O2 sats 100% on 2L [place] >> now on Room Air.  Blood pressures stable. Lower extremity Venous Duplex negative for DVT.  -  Hypercoagulable 2/2 neoplasia at this point. ESR 59 (9/22)  - Now on IV heparin -- Will begin bridging as soon as possible (post surgical); waiting until thyroidectomy.  - Per pharm--recommendation for long-term  Lovenox; Difficult due to self-pay, CM is working on solution. If it is possible, Dose would be 140m SQ q12h - Will continue to monitor VS. Stable at this time.  #Hyperglycemia -  Likely diabetic with PCOS given A1C of 6.7. CBG's 180's - 200s.  - Patient informed of elevated A1C and likely diagnosis of DMII. She agrees with uKoreathat this is a marginal issue at this point, and we will recommend treatment based upon our goals of care discussion.  - Continue to monitor.  - Resistant SSI; 15U Lantus. (will hold for now, may increase lantus tomorrow 10/16)  #Hypervirilization - with clitoromegaly noted on prior exam and buffalo hump and obesity as well as upper lip hair. PCOS vs. Adrenal patholog. Hypertensive and not hypotensive. Testing during this hospitalization after having recently received steroids would be largely non-diagnostic. Though, could also be a combination of isolated processes i.e. Clitoromegaly, Hirsutism 2/2 obesity. - Will leave for Outpatient f/u -- [place] ovaries vs 21-hydroxylase/ACTH/cortisol.  - TSH, Cortisol WNL - A1C : 6.7 - consistent with PCOS given evidence of cystic structures in pelvis.   # UTI, 2/2 foley catheter - UCx = Proteus  - dysuria and suprapubic pain noted PM 10/1. - Cipro 2531mBID (10/2 started) -- treated for 7 days -- DC 10/9.  #HTN- BP fairly well controlled -continue losartan.   #Obesity-  - consistent with potential PCOS.   FEN/GI: Reg diet; KVO; s/p Enema with some relief Prophylaxis: on full dose IV heparin, SCDs  Disposition: Pending Goals of Care discussion and endocrinology recommendations. Possible discharge early next week.  Subjective: Patient doing well this morning. No new developments overnight. Continues to have regular BMs. She is interested in what ENT will have to say. We discussed plans for eventual DC in order to have a definitive plan for the future, she said she would talk about this w/ her mom tonight and have a plan  by tomorrow.    Objective: Temp:  [98.3 F (36.8 C)-99.1 F (37.3 C)] 98.6 F (37 C) (10/15 0531) Pulse Rate:  [97-110] 100 (10/15 0531) Resp:  [18-20] 18 (10/15 0531) BP: (100-120)/(52-65) 113/52 mmHg (10/15 0531) SpO2:  [97 %-100 %] 100 % (10/15 0531) Physical Exam: General: NAD, AAOx3, morbidly obese  HEENT: Thyromegaly / Goiter noted on exam without tenderness, and no nodules, no LAD, neck supple, J-line in place, lipoma/cyst on forehead Cardio: distant heart sounds Lungs: CTA without wheezes. Abdomen: Soft and nontender, mildly distended, No organomegaly noted.  Extremities: no edema,  No calf tenderness, Skin: No lesions or rashes noted Neuro: cranial nerves grossly normal. Normal speech; Neuro exam is at new baseline. No new changes. Sensation greatly diminished from umbilicus to toes. 0/5 strength bilaterally below waist. Otherwise no new sensory/motor deficits.  Laboratory:  Recent Labs Lab 01/03/14 0759 01/04/14 0425 01/05/14 0715  WBC 7.8 6.6 5.5  HGB 9.3* 8.5* 8.1*  HCT 26.8* 24.6* 24.1*  PLT 167 148* 137*    Recent Labs Lab 01/05/14 0715  NA 131*  K 3.8  CL 95*  CO2 26  BUN 9  CREATININE 0.71  CALCIUM 8.0*  PROT 5.1*  BILITOT 1.0  ALKPHOS 273*  ALT 38*  AST 32  GLUCOSE 150*   Urinalysis    Component Value Date/Time  COLORURINE AMBER* 12/23/2013 1058   APPEARANCEUR TURBID* 12/23/2013 1058   LABSPEC 1.018 12/23/2013 1058   PHURINE 7.5 12/23/2013 1058   GLUCOSEU NEGATIVE 12/23/2013 1058   HGBUR LARGE* 12/23/2013 1058   BILIRUBINUR NEGATIVE 12/23/2013 1058   KETONESUR NEGATIVE 12/23/2013 1058   PROTEINUR 100* 12/23/2013 1058   UROBILINOGEN 0.2 12/23/2013 1058   NITRITE NEGATIVE 12/23/2013 1058   LEUKOCYTESUR LARGE* 12/23/2013 1058    Phos - 3.9 Mg - 2.2 CRP - 11.6 ESR - 59 CK - 56 B12 - 209 Random Cortisol - 25.4 A1C - 6.7 TSH - 2.13 RPR - nonreactive HIV - nonreactive UPT - Negative  Imaging/Diagnostic Tests:  CT Chest Abd/ Pelvis  9/23:  CLINICAL DATA: Destructive metastatic lesion of the T3 vertebral  body with no known primary carcinoma.  EXAM:  CT CHEST, ABDOMEN, AND PELVIS WITH CONTRAST  TECHNIQUE:  Multidetector CT imaging of the chest, abdomen and pelvis was  performed following the standard protocol during bolus  administration of intravenous contrast.  CONTRAST: 160m OMNIPAQUE IOHEXOL 300 MG/ML SOLN  COMPARISON: MRI of the cervical spine earlier today.  FINDINGS:  CT CHEST FINDINGS  Within the chest, a right suprahilar mass measures approximately 3.0  x 2.7 x 2.2 cm. This either represents a nodal metastasis or central  lung mass such as small cell carcinoma. A number of smaller  mediastinal lymph nodes are also identified in the right  paratracheal region, prevascular space and AP window.  Destructive lesion of the T3 vertebral body noted which nearly  replaces the entire vertebral body with tumor extension posteriorly  into the epidural space causing significant compression of the  spinal cord. Paravertebral extension of tumor is also suspected  lateral and anterior to the vertebral body, right greater than left.  Massive enlargement of the thyroid gland present with transverse  dimensions of approximately 7.7 x 8.5 cm. The thyroid gland is  incompletely visualized as the neck was not included. Central  dystrophic calcification identified. The enlarged thyroid gland does  cause significant mass effect on the airway with evidence of  tracheal deviation to the right and tracheal narrowing at the  thoracic inlet. The enlarged thyroid gland extends into the superior  mediastinum.  There is evidence of pulmonary embolism with nonocclusive thrombus  identified in the right middle lobe and lower lobe pulmonary  arteries. Pulmonary arterial opacification is not optimal on the  standard venous phase evaluation of the chest but the right sided  pulmonary embolism is clearly present.  There are small  bilateral pulmonary nodules. Right-sided pulmonary  nodule measuring 6 mm likely localizes to the very top of the lower  lobe near the major fissure. Left upper lobe nodule measures 7 mm.  Lingular nodule measures 7 mm. Left lower lobe nodule measures 7 mm.  Faint peripheral right middle lobe nodule measures 3 mm. Inferior  right middle lobe nodule measures 6 mm. Several small nodules in the  posterior right upper lobe identified in the 3-4 mm range.  No evidence of pleural or pericardial fluid. No other bony lesions  identified in the thorax.  CT ABDOMEN AND PELVIS FINDINGS  Small low-density lesion in the left lobe of the liver measures 7 mm  and likely represents a cyst. No other hepatic abnormalities. The  gallbladder, pancreas, spleen, adrenal glands and kidneys are  unremarkable. No adenopathy is identified in the abdomen or pelvis.  Bowel shows normal caliber without evidence of obstruction, lesion  or inflammation. No abnormal fluid collections.  The bladder is moderately distended. Elongated cystic changes in  both adnexal regions identified which may represent a combination of  ovarian cysts and potentially hydrosalpinx. Correlation with pelvic  ultrasound is recommended. No hernias are identified. The uterus  appears unremarkable by CT. No bony lesions are identified in the  abdomen or pelvis.  IMPRESSION:  1. 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  2. 3 cm right suprahilar mass may represent nodal metastasis versus  primary central tumor. Next number multiple small bilateral  pulmonary nodules consistent with metastatic disease.  3. Acute nonocclusive pulmonary embolism in the right middle lobe  and right lower lobe pulmonary arteries.  4. Massive thyroid goiter causing tracheal deviation to the right  and narrowing of the trachea.  5. Tubular  cystic abnormalities of both adnexal regions which may be  a combination of cystic changes in the ovaries and potentially  hydrosalpinx. Correlation recommended with pelvic ultrasound to  exclude any suspicious cystic lesions of the adnexae.  6. Critical Value/emergent results were called by telephone at the  time of interpretation on 12/14/2013 at 4:52 pm to Dr. Tamala Julian of the  [facility] Medicine Teaching Service, who verbally acknowledged these  results.  Electronically Signed  By: Aletta Edouard M.D.  On: 12/14/2013 16:53   MRI T-spine 9/23:  CLINICAL DATA: Lower extremity weakness. Worsening back pain.  Compressive upper thoracic mass partially visualized on cervical  spine MRI as well as chest CT with spinal cord compression.  EXAM:  MRI THORACIC SPINE WITHOUT CONTRAST  TECHNIQUE:  Multiplanar, multisequence MR imaging of the thoracic spine was  performed. No intravenous contrast was administered.  COMPARISON: Cervical spine MRI and chest CT earlier today  FINDINGS:  There is a T3 compression fracture with approximately 50% height  loss centrally. As described on cervical spine MRI and chest CT  earlier today, there is abnormal soft tissue replacing the majority  of the T3 vertebral body extending into the pedicles as well as into  the ventral epidural space with moderate cord compression. Epidural  extension is slightly greater on the right and extends into the  inferior aspect of the right T2-3 neural foramen as well as superior  aspect of the right T3-4 neural foramen. There is also abnormal  paravertebral soft tissue laterally and anteriorly as seen on CT.  Central ventral epidural material posterior to the T4 vertebral body  may represent tumor or hematoma. Mild T2 hyperintensity is present  in the spinal cord at the T3 level extending inferiorly to the T4-5  disc space level consistent with mild edema. Mild edema is present  in the posterior paraspinal soft tissues of the  upper thoracic  spine. No other vertebral body lesions are identified.  There is a moderate-sized central disc extrusion at T7-8 which  contacts and mildly flattens the ventral spinal cord with mild  spinal stenosis. A small left central disc protrusion is present at  T8-9 with at most minimal impression on the spinal cord. Massive  enlargement of the left thyroid lobe is partially visualized.  Mediastinal and lung findings are better demonstrated on recent  chest CT.  IMPRESSION:  1. T3 vertebral body mass with pathologic compression fracture and  epidural tumor resulting in moderate cord compression with evidence  of mild spinal cord edema.  2. T7-8 disc extrusion resulting in mild spinal stenosis.   MRI Head / C-Spine  9/23:  CLINICAL DATA: 48 year old female who presented with back pain and  lower extremity numbness, with continued inadequately explained  lower extremity weakness following Hospital discharge on 12/12/2013.  Initial encounter.  EXAM:  MRI HEAD WITHOUT AND WITH CONTRAST  MRI CERVICAL SPINE WITHOUT AND WITH CONTRAST  TECHNIQUE:  Multiplanar, multiecho pulse sequences of the brain and surrounding  structures, and cervical spine, to include the craniocervical  junction and cervicothoracic junction, were obtained without and  with intravenous contrast.  CONTRAST: 20 mL MultiHance.  COMPARISON: Lumbar MRI 12/10/2013.  FINDINGS:  MRI HEAD FINDINGS  Cerebral volume is normal. No restricted diffusion to suggest acute  infarction. No midline shift, mass effect, evidence of mass lesion,  ventriculomegaly, extra-axial collection or acute intracranial  hemorrhage. Cervicomedullary junction and pituitary are within  normal limits. Major intracranial vascular flow voids are preserved.  Pearline Cables and white matter signal is within normal limits for age  throughout the brain. No abnormal enhancement identified, pulsation  artifact prominent on axial post-contrast images.  Large  body habitus. Small right frontal scalp lipoma (series 10,  image 37). Visible internal auditory structures appear normal. Mild  right mastoid fluid. Minor paranasal sinus mucosal thickening.  Visualized orbit soft tissues are within normal limits. Partially  visible thyromegaly greater on the left, see below. Normal bone  marrow signal.  MRI CERVICAL SPINE FINDINGS  Large body habitus resulting in wrap artifact on axial images.  However, there is massive thyroid enlargement evident, with the left  lobe measuring at least 7.2 x 5.2 x 11.2 cm (AP by transverse by  CC). The trachea is displaced to the right at the thoracic inlet and  mildly compressed. There is other regional mass effect including  sheath. On the left carotid  Fairly preserved cervical lordosis. No marrow edema or evidence of  acute osseous abnormality. Cervicomedullary junction is within  normal limits. Spinal cord signal is within normal limits at all  visualized levels. No abnormal intradural enhancement.  C2-C3: Negative.  C3-C4: Disc osteophyte complex, facet and ligament flavum  hypertrophy. Along with a degree of congenital canal narrowing,  there is mild spinal stenosis and moderate biforaminal stenosis.  C4-C5: Mild uncovertebral and facet hypertrophy. Mild right C5  foraminal stenosis.  C5-C6: Circumferential disc osteophyte complex. Along with a degree  of congenital canal narrowing there is mild spinal stenosis.  Moderate right C6 foraminal stenosis.  C6-C7: Negative except for mild facet hypertrophy.  C7-T1: Negative.  T1-T2: Negative.  Faintly visualized at the T3 level in the upper thoracic spine there  is a lobulated solidly enhancing mass which appears to extend into  the epidural space from the T3 vertebral body. This is inseparable  from the spinal cord which is likely compressed. See series 2000  image 7 and series 1500, image 6. This is not included on axial  images.  IMPRESSION:  1. Faintly  visible but aggressive appearing enhancing mass at the T3  level of the upper thoracic spine, likely with spinal cord  compression evaluate further are with thoracic MRI without and with  contrast.  2. Normal for age MRI appearance of the brain.  3. Massive thyroid enlargement, greater on the left. Left thyroid  goiter/mass measuring at least 7.2 x 5.2 x 11.2 cm. Mass effect at  the thoracic inlet including on the trachea.  4. Mild chronic disc and endplate degeneration in the cervical spine  at C3-C4 and C5-C6 resulting in borderline to mild spinal stenosis,  but no cervical spinal  cord mass effect or signal abnormality.  Electronically Signed:  By: Lars Pinks M.D.  On: 12/14/2013 09:50   MRI L-Spine 9/19:  CLINICAL DATA: Back pain with bilateral leg numbness, worsening  with oral steroids.  EXAM:  MRI LUMBAR SPINE WITHOUT CONTRAST  TECHNIQUE:  Multiplanar, multisequence MR imaging of the lumbar spine was  performed. No intravenous contrast was administered.  COMPARISON: CT 12/09/2013  FINDINGS:  There is no degenerative disease at L2-3 or above. The discs are  normal in signal characteristics and morphology. The spinal canal  and foramina are widely patent. The distal cord and conus are normal  with conus tip at L1. There are small benign and insignificant  hemangiomas within the L2 and L3 vertebral bodies.  L3-4: Minimal desiccation of the disc. Mild circumferential bulging  slightly more prominent towards the left. Bilateral facet  degeneration and hypertrophy. Mild narrowing of the lateral recesses  without apparent neural compression. Mild foraminal narrowing on the  left without apparent neural compression.  L4-5: Desiccation of the disc with circumferential protrusion.  Bilateral facet and ligamentous hypertrophy. Stenosis of both  lateral recesses without definite focal neural compression. Mild  foraminal narrowing bilaterally.  L5-S1: Desiccation of the disc with loss  of height. Endplate  osteophytes and bulging of the disc more prominent towards the left.  Bilateral facet degeneration and hypertrophy left more than right.  Stenosis of the subarticular lateral recess on the left that could  compress the S1 nerve root. Mild foraminal narrowing left more than  right but without apparent compression of the exiting L5 nerve  roots.  Serpiginous fluid-filled structure in the left pelvis could  represent hydrosalpinx. This is not primarily or completely imaged.  IMPRESSION:  Degenerative disc disease and degenerative facet disease in the  lower lumbar spine that could be a cause of lumbago.  Lateral recess and foraminal narrowing at L3-4, L4-5 and L5-S1 as  described above that could be a cause of focal neural compression.  In general, this is more pronounced on the left than on the right.  In particular, there is potential for left S1 nerve root compression  in the subarticular lateral recess at L5-S1.  Electronically Signed  By: Nelson Chimes M.D.  On: 12/10/2013 08:45   CT L-Spine 9/18:  CLINICAL DATA: Low back pain with difficulty walking.  EXAM:  CT LUMBAR SPINE WITHOUT CONTRAST  TECHNIQUE:  Multidetector CT imaging of the lumbar spine was performed without  intravenous contrast administration. Multiplanar CT image  reconstructions were also generated.  COMPARISON: None.  FINDINGS:  There is no fracture or spondylolisthesis. There is moderately  severe disc space narrowing at L5-S1 with slight vacuum phenomenon  at this level. There is moderate narrowing at L4-5. There are  prominent anterior osteophytes at L2, L3, L4, and L5.  At T12-L1, there is moderate facet hypertrophy bilaterally. There is  no nerve root edema or effacement. No disc extrusion or stenosis.  At L1-2, there is moderate facet hypertrophy bilaterally. There is  mild diffuse disc bulging. There is no nerve root edema or  effacement. No disc extrusion or stenosis.  At L2-3,  there is broad-based disc bulging and moderate facet  hypertrophy bilaterally. There is borderline narrowing of the thecal  sac with borderline spinal stenosis. No disc extrusion.  At L3-4, there is severe facet osteoarthritic change bilaterally,  more pronounced on the left than on the right. There is broad-based  disc bulging and moderate ligamentum flavum hypertrophy. These  findings in  concert lead to moderate generalized spinal stenosis.  At L4-5, there is broad-based disc protrusion and severe facet  osteoarthritic change bilaterally. These findings in concert lead to  moderate generalized spinal stenosis. No frank disc extrusion.  At L5-S1, there is severe facet osteoarthritic change on the left  and moderate facet osteoarthritic change on the right. There is  broad-based disc protrusion. There is exit foraminal narrowing  bilaterally, more severe on the left than on the right, due to  osteophytic change. No frank disc extrusion or stenosis is seen at  this level.  There are no paraspinous lesions.  IMPRESSION:  Spinal stenosis at L3-4 and L4-5, multifactorial. Borderline spinal  stenosis at L2-3. There does appear to be a degree of congenital  narrowing of the canal which contributes to spinal stenosis.  Multilevel arthropathy.  No frank disc extrusion.  No fracture or spondylolisthesis.  Electronically Signed  By: Lowella Grip M.D.  On: 12/09/2013 13:50   CXR 9/13:  CLINICAL DATA: Upper back pain for 5 weeks. Dyspnea on exertion.  Nonsmoker. History of hypertension.  EXAM:  CHEST 2 VIEW  COMPARISON: 04/15/2006  FINDINGS:  The heart is mildly enlarged. There is elevation of right  hemidiaphragm, stable in appearance. There is streaky density  favored to be within the right lower lobe, consistent with  atelectasis or infiltrate. No pulmonary edema. No pleural effusions.  There are moderate degenerative changes in the thoracic spine.  IMPRESSION:  Right lower lobe  atelectasis or infiltrate.  Electronically Signed  By: Shon Hale M.D.  On: 12/04/2013 19:26  Venous Duplex 9/24:  Summary:  - No evidence of deep vein thrombosis involving the right lower extremity. - No evidence of deep vein thrombosis involving the left lower extremity.   Surgical Pathology Report: 12/21/2013 Diagnosis Soft tissue mass, simple excision, Thoracic Epidural Tumor - METASTATIC CARCINOMA. PLEASE SEE COMMENT. Microscopic Comment There are clusters of malignant cells with abundant eosinophilic cytoplasm. A battery of immunostains were performed and the tumor cells show the following profile: Cytokeratin 7 - strongly positive Cytokeratin 8/18 - strongly positive TTF-1 - strongly positive. Thyroglobulin - negative CD10 - negative Cytokeratin 20 - negative S100 - negative Vimentin - negative The control stained appropriately. The overall findings are diagnostic for metastatic carcinoma. 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 However, a metastatic oncocytic thyroid carcinoma is still in the differential. Clinical and radiographic correlation is highly recommended. (HCL:kh 12-19-13) Aldona Bar MD Pathologist, Electronic Signature (Case signed 12/20/2013)  Elberta Leatherwood, MD 01/05/2014, 9:26 AM PGY-1, [facility] pager: [phone], text pages welcome

## 2014-01-05 NOTE — Consult Note (Signed)
Reason for Consult: Thyroid mass Referring Physician: Dickie La, MD  Sarah [name] is an 48 y.o. female.  HPI: Very large thyroid mass, metastatic lesion in the thoracic spine, positive for carcinoma. Core biopsy of the thyroid reveals oncocytic cells, not diagnostic for carcinoma. Concern is that this could be a metastatic thyroid carcinoma. His history of radioiodine ablation about 20 years ago for unknown disease.  Past Medical History  Diagnosis Date  . Hypertension   . DDD (degenerative disc disease), cervical   . DJD (degenerative joint disease)   . Obesity   . Chronic back pain     Past Surgical History  Procedure Laterality Date  . Tonsillectomy    . Laminectomy N/A 12/14/2013    Procedure: THORACIC LAMINECTOMY FOR TUMOR THORACIC THREE;  Surgeon: Ashok Pall, MD;  Location: [facility];  Service: Neurosurgery;  Laterality: N/A;  THORACIC LAMINECTOMY FOR TUMOR THORACIC THREE  . Posterior lumbar fusion 4 level N/A 12/30/2013    Procedure: Thoracic one-Thoracic five posterior thoracic fusion with pedicle screws;  Surgeon: Ashok Pall, MD;  Location: [facility];  Service: Neurosurgery;  Laterality: N/A;  Thoracic one-Thoracic five posterior thoracic fusion with pedicle screws    Family History  Problem Relation Age of Onset  . Hypertension Mother   . Diabetes Mother   . Hypertension Father   . Stroke Father     Social History:  reports that she has never smoked. She does not have any smokeless tobacco history on file. She reports that she does not drink alcohol or use illicit drugs.  Allergies:  Allergies  Allergen Reactions  . Tramadol Nausea And Vomiting    Medications: Reviewed  Results for orders placed during the hospital encounter of 12/12/13 (from the past 48 hour(s))  GLUCOSE, CAPILLARY     Status: Abnormal   Collection Time    01/03/14 11:29 AM      Result Value Ref Range   Glucose-Capillary 214 (*) 70 - 99 mg/dL  HEPARIN LEVEL (UNFRACTIONATED)      Status: Abnormal   Collection Time    01/03/14  2:43 PM      Result Value Ref Range   Heparin Unfractionated 0.22 (*) 0.30 - 0.70 IU/mL   Comment:            IF HEPARIN RESULTS ARE BELOW     EXPECTED VALUES, AND PATIENT     DOSAGE HAS BEEN CONFIRMED,     SUGGEST FOLLOW UP TESTING     OF ANTITHROMBIN III LEVELS.  GLUCOSE, CAPILLARY     Status: Abnormal   Collection Time    01/03/14  4:29 PM      Result Value Ref Range   Glucose-Capillary 154 (*) 70 - 99 mg/dL  GLUCOSE, CAPILLARY     Status: Abnormal   Collection Time    01/03/14  9:05 PM      Result Value Ref Range   Glucose-Capillary 187 (*) 70 - 99 mg/dL   Comment 1 Documented in Chart     Comment 2 Notify RN    HEPARIN LEVEL (UNFRACTIONATED)     Status: Abnormal   Collection Time    01/03/14  9:58 PM      Result Value Ref Range   Heparin Unfractionated 0.17 (*) 0.30 - 0.70 IU/mL   Comment:            IF HEPARIN RESULTS ARE BELOW     EXPECTED VALUES, AND PATIENT     DOSAGE HAS  BEEN CONFIRMED,     SUGGEST FOLLOW UP TESTING     OF ANTITHROMBIN III LEVELS.  CBC     Status: Abnormal   Collection Time    01/04/14  4:25 AM      Result Value Ref Range   WBC 6.6  4.0 - 10.5 K/uL   RBC 2.95 (*) 3.87 - 5.11 MIL/uL   Hemoglobin 8.5 (*) 12.0 - 15.0 g/dL   HCT 24.6 (*) 36.0 - 46.0 %   MCV 83.4  78.0 - 100.0 fL   MCH 28.8  26.0 - 34.0 pg   MCHC 34.6  30.0 - 36.0 g/dL   RDW 14.8  11.5 - 15.5 %   Platelets 148 (*) 150 - 400 K/uL  HEPARIN LEVEL (UNFRACTIONATED)     Status: Abnormal   Collection Time    01/04/14  6:25 AM      Result Value Ref Range   Heparin Unfractionated 0.87 (*) 0.30 - 0.70 IU/mL   Comment:            IF HEPARIN RESULTS ARE BELOW     EXPECTED VALUES, AND PATIENT     DOSAGE HAS BEEN CONFIRMED,     SUGGEST FOLLOW UP TESTING     OF ANTITHROMBIN III LEVELS.  GLUCOSE, CAPILLARY     Status: Abnormal   Collection Time    01/04/14  7:08 AM      Result Value Ref Range   Glucose-Capillary 170 (*) 70 - 99 mg/dL    Comment 1 Documented in Chart     Comment 2 Notify RN    GLUCOSE, CAPILLARY     Status: Abnormal   Collection Time    01/04/14 11:02 AM      Result Value Ref Range   Glucose-Capillary 216 (*) 70 - 99 mg/dL  HEPARIN LEVEL (UNFRACTIONATED)     Status: None   Collection Time    01/04/14  3:04 PM      Result Value Ref Range   Heparin Unfractionated 0.43  0.30 - 0.70 IU/mL   Comment:            IF HEPARIN RESULTS ARE BELOW     EXPECTED VALUES, AND PATIENT     DOSAGE HAS BEEN CONFIRMED,     SUGGEST FOLLOW UP TESTING     OF ANTITHROMBIN III LEVELS.  GLUCOSE, CAPILLARY     Status: Abnormal   Collection Time    01/04/14  4:58 PM      Result Value Ref Range   Glucose-Capillary 189 (*) 70 - 99 mg/dL  GLUCOSE, CAPILLARY     Status: Abnormal   Collection Time    01/04/14  9:03 PM      Result Value Ref Range   Glucose-Capillary 164 (*) 70 - 99 mg/dL   Comment 1 Documented in Chart     Comment 2 Notify RN    GLUCOSE, CAPILLARY     Status: Abnormal   Collection Time    01/05/14  6:44 AM      Result Value Ref Range   Glucose-Capillary 223 (*) 70 - 99 mg/dL  CBC     Status: Abnormal   Collection Time    01/05/14  7:15 AM      Result Value Ref Range   WBC 5.5  4.0 - 10.5 K/uL   RBC 2.87 (*) 3.87 - 5.11 MIL/uL   Hemoglobin 8.1 (*) 12.0 - 15.0 g/dL   HCT 24.1 (*) 36.0 - 46.0 %   MCV  84.0  78.0 - 100.0 fL   MCH 28.2  26.0 - 34.0 pg   MCHC 33.6  30.0 - 36.0 g/dL   RDW 14.6  11.5 - 15.5 %   Platelets 137 (*) 150 - 400 K/uL  COMPREHENSIVE METABOLIC PANEL     Status: Abnormal   Collection Time    01/05/14  7:15 AM      Result Value Ref Range   Sodium 131 (*) 137 - 147 mEq/L   Potassium 3.8  3.7 - 5.3 mEq/L   Chloride 95 (*) 96 - 112 mEq/L   CO2 26  19 - 32 mEq/L   Glucose, Bld 150 (*) 70 - 99 mg/dL   BUN 9  6 - 23 mg/dL   Creatinine, Ser 0.71  0.50 - 1.10 mg/dL   Calcium 8.0 (*) 8.4 - 10.5 mg/dL   Total Protein 5.1 (*) 6.0 - 8.3 g/dL   Albumin 1.7 (*) 3.5 - 5.2 g/dL   AST 32  0  - 37 U/L   ALT 38 (*) 0 - 35 U/L   Alkaline Phosphatase 273 (*) 39 - 117 U/L   Total Bilirubin 1.0  0.3 - 1.2 mg/dL   GFR calc non Af Amer >90  >90 mL/min   GFR calc Af Amer >90  >90 mL/min   Comment: (NOTE)     The eGFR has been calculated using the CKD EPI equation.     This calculation has not been validated in all clinical situations.     eGFR's persistently <90 mL/min signify possible Chronic Kidney     Disease.   Anion gap 10  5 - 15  HEPARIN LEVEL (UNFRACTIONATED)     Status: None   Collection Time    01/05/14  7:15 AM      Result Value Ref Range   Heparin Unfractionated 0.36  0.30 - 0.70 IU/mL   Comment:            IF HEPARIN RESULTS ARE BELOW     EXPECTED VALUES, AND PATIENT     DOSAGE HAS BEEN CONFIRMED,     SUGGEST FOLLOW UP TESTING     OF ANTITHROMBIN III LEVELS.    No results found.  UXN:ATFTDDUK except as listed in admit H&P  Blood pressure 113/52, pulse 100, temperature 98.6 F (37 C), temperature source Oral, resp. rate 18, height 5' 11" (1.803 m), weight 142.4 kg (313 lb 15 oz), last menstrual period 11/28/2013, SpO2 100.00%.  PHYSICAL EXAM: Overall appearance:  Healthy appearing, obese lady, in no distress Head:  Normocephalic, atraumatic. Ears: External ears are normal. Nose: External nose is healthy in appearance. Internal nasal exam free of any lesions or obstruction. Oral Cavity:  There are no mucosal lesions or masses identified. Neuro:  No identifiable cranial neurologic deficits. Neck: Large, firm thyroid mass bilaterally.  Studies Reviewed: CT scan reviewed, biopsies reviewed.  Procedures: none   Assessment/Plan: Agree with recommendation for total thyroidectomy. We discussed the nature of the surgery, the risks to the recurrent laryngeal laryngeal nerves and parathyroid glands. All questions were answered. We will schedule as soon as possible. We'll need to find out about stopping the IV heparin first.  Sarah [name] 01/05/2014, 9:33 AM

## 2014-01-06 LAB — CBC
HEMATOCRIT: 25.9 % — AB (ref 36.0–46.0)
Hemoglobin: 8.9 g/dL — ABNORMAL LOW (ref 12.0–15.0)
MCH: 29.6 pg (ref 26.0–34.0)
MCHC: 34.4 g/dL (ref 30.0–36.0)
MCV: 86 fL (ref 78.0–100.0)
PLATELETS: 128 10*3/uL — AB (ref 150–400)
RBC: 3.01 MIL/uL — ABNORMAL LOW (ref 3.87–5.11)
RDW: 14.7 % (ref 11.5–15.5)
WBC: 5.3 10*3/uL (ref 4.0–10.5)

## 2014-01-06 LAB — HEPARIN LEVEL (UNFRACTIONATED)
HEPARIN UNFRACTIONATED: 0.27 [IU]/mL — AB (ref 0.30–0.70)
HEPARIN UNFRACTIONATED: 0.44 [IU]/mL (ref 0.30–0.70)

## 2014-01-06 LAB — GLUCOSE, CAPILLARY
GLUCOSE-CAPILLARY: 178 mg/dL — AB (ref 70–99)
Glucose-Capillary: 153 mg/dL — ABNORMAL HIGH (ref 70–99)
Glucose-Capillary: 158 mg/dL — ABNORMAL HIGH (ref 70–99)
Glucose-Capillary: 233 mg/dL — ABNORMAL HIGH (ref 70–99)

## 2014-01-06 NOTE — Progress Notes (Signed)
Physical Therapy Treatment Patient Details Name: Sarah [name] MRN: [id number] DOB: 1965-07-10 Today's Date: 01/06/2014    History of Present Illness 48 y.o. female admitted to [facility] on 12/12/13 with progressive lower extremity weakness and mid to upper back pain.  Pt was found to have a mass at T3 with (+) spinal cord compression.  On 9/23 pt underwent surgery for decompression and biopsy.  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  Her thyroid is very enlarged.  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 Pt is NOW S/P T1-T5 postlat arthrodesis allograft, T1-T5 segmental pedicle screw fixation on 12/30/13.  New therapy orders with updated activity orders written on 12/30/13.    PT Comments    Pt is progressing nicely with her WC mobility.  I spoke with her at length about the need to start being more aggressive and trying to sit on the side of the bed next week and she was agreeable that this needed to be done to get her more comfortable in sitting and to help her progress to transfers without the lift machine.  She continues to be an excellent inpatient rehab candidate and I would like to talk to her more about this after her procedure scheduled for Monday of next week.  She really wants to go home, but I think she can make her transition home easier if she and her family can go through inpatient rehab and get the education and rehab to make both her mobility better and her caregivers better at caring for her.  PT to continue to follow acutely.  Goals updated today.   Follow Up Recommendations  CIR     Equipment Recommendations  Wheelchair (measurements PT);Wheelchair cushion (measurements PT);Hospital bed;3in1 (PT) (22x18 gel back cushion, drop arm 3-in-1, slide board, hoyer)    Recommendations for Other Services Rehab consult     Precautions / Restrictions Precautions Precautions: Fall Precaution Comments:  bil leg weakness, paraplegia    Mobility  Bed Mobility Overal bed mobility: Needs Assistance;+2 for physical assistance Bed Mobility: Rolling Rolling: +2 for physical assistance;Max assist         General bed mobility comments: Two person max assist to roll bil for peri care an donning of lift pad.  Pt is able to reach and stabilize with her arms, but needs max assist to get rolled far enough over to reach the railing and for positioning of her legs in flexion to roll.   Transfers Overall transfer level: Needs assistance               General transfer comment: maxi move lift used for OOB to WC.  Two person assist for safety.  Pt not able to attempt sitting EOB or slide board out of bed without at least 3 or 4 person assist.  Will likely be able to do this first of the week after her surgery.      Information systems manager mobility: Yes Wheelchair propulsion: Both upper extremities Wheelchair parts: Supervision/cueing Distance: 68 Wheelchair Assistance Details (indicate cue type and reason): Pt pushed out of the tight room with many obstacles into the wide hallway and she was able to wheel herself with bil upper extremities further down the hall than yesterday and with only supervision assistance.  Next session WC mobility should focus on navigating more obstacles and turns.  Cognition Arousal/Alertness: Awake/alert Behavior During Therapy: WFL for tasks assessed/performed Overall Cognitive Status: Within Functional Limits for tasks assessed                         General Comments General comments (skin integrity, edema, etc.): TED hose donned, but no BPs taken today as pt is very alert and not symptomatic.       Pertinent Vitals/Pain Pain Assessment: Faces Faces Pain Scale: Hurts a little bit Pain Location: surgical Pain Descriptors / Indicators: Aching;Burning Pain Intervention(s): Limited activity within patient's  tolerance;Monitored during session;Repositioned           PT Goals (current goals can now be found in the care plan section) Acute Rehab PT Goals Patient Stated Goal: to go home PT Goal Formulation: With patient Time For Goal Achievement: 01/20/14 Potential to Achieve Goals: Fair Progress towards PT goals: Progressing toward goals    Frequency  Min 5X/week    PT Plan Current plan remains appropriate       End of Session Equipment Utilized During Treatment: Gait belt (pt likes it while she is in the [facility], she feels safer. ) Activity Tolerance: Patient limited by fatigue;Patient limited by pain Patient left: in chair;with call bell/phone within reach     Time: 3903-0092 PT Time Calculation (min): 47 min  Charges:  $Therapeutic Activity: 23-37 mins $Wheel Chair Management: 8-22 mins                     Rhegan Trunnell B. Cailean Heacock, PT, DPT [phone]   01/06/2014, 5:06 PM

## 2014-01-06 NOTE — Progress Notes (Signed)
ANTICOAGULATION CONSULT NOTE - Follow Up Consult  Pharmacy Consult for heparin Indication: pulmonary embolus  Allergies  Allergen Reactions  . Tramadol Nausea And Vomiting    Patient Measurements: Height: 5\' 11"  (180.3 cm) Weight: 313 lb 15 oz (142.4 kg) IBW/kg (Calculated) : 70.8 Heparin Dosing Weight: 105kg  Vital Signs: Temp: 97.7 F (36.5 C) (10/16 0500) Temp Source: Oral (10/16 0500) BP: 129/69 mmHg (10/16 0500) Pulse Rate: 110 (10/16 0500)  Labs:  Recent Labs  01/04/14 0425  01/05/14 0715 01/05/14 2103 01/06/14 0525  HGB 8.5*  --  8.1*  --  8.9*  HCT 24.6*  --  24.1*  --  25.9*  PLT 148*  --  137*  --  128*  HEPARINUNFRC  --   < > 0.36 0.29* 0.27*  CREATININE  --   --  0.71  --   --   < > = values in this interval not displayed.  Estimated Creatinine Clearance: 136.4 ml/min (by C-G formula based on Cr of 0.71).   Medications:  Heparin @ 2400units/hr  Assessment: 47yo female to restart heparin for RLL PE after it was held this morning for potential thyroid surgery-Surgery will be next week. Heparin level this am 0.27 despite rate increase last night No bleeding noted, Hgb and plts have dropped but appear stable.  Goal of Therapy:  Heparin level 0.3-0.7 units/ml Monitor platelets by anticoagulation protocol: Yes   Plan:   1. Increase heparin slightly to 2500 units/hr 2. Next HL with AM labs; daily HL and CBC 3. Follow for s/s bleeding Thanks for allowing pharmacy to be a part of this patient's care.  Excell Seltzer, PharmD Clinical Pharmacist, [phone]  01/06/2014 7:19 AM

## 2014-01-06 NOTE — Progress Notes (Signed)
Chaplain attempted follow up, pt sleeping. Chaplain will continue to follow.   01/06/14 1500  Clinical Encounter Type  Visited With Patient  Visit Type Follow-up  Keevan Wolz, Barbette Hair, Chaplain 01/06/2014 3:11 PM

## 2014-01-06 NOTE — Progress Notes (Signed)
ANTICOAGULATION CONSULT NOTE - Follow Up Consult  Pharmacy Consult for Heparin Indication: pulmonary embolus  Allergies  Allergen Reactions  . Tramadol Nausea And Vomiting    Patient Measurements: Height: 5\' 11"  (180.3 cm) Weight: 313 lb 15 oz (142.4 kg) IBW/kg (Calculated) : 70.8 Heparin Dosing Weight: 105kg  Vital Signs: Temp: 98.6 F (37 C) (10/16 1724) Temp Source: Oral (10/16 1724) BP: 106/62 mmHg (10/16 1724) Pulse Rate: 100 (10/16 1724)  Labs:  Recent Labs  01/04/14 0425  01/05/14 0715 01/05/14 2103 01/06/14 0525 01/06/14 1330  HGB 8.5*  --  8.1*  --  8.9*  --   HCT 24.6*  --  24.1*  --  25.9*  --   PLT 148*  --  137*  --  128*  --   HEPARINUNFRC  --   < > 0.36 0.29* 0.27* 0.44  CREATININE  --   --  0.71  --   --   --   < > = values in this interval not displayed.  Estimated Creatinine Clearance: 136.4 ml/min (by C-G formula based on Cr of 0.71).   Medications:  Heparin 2500 units/hr  Assessment: 48yof continues on heparin for RLL PE while awaiting thyroidectomy scheduled for 10/19. Heparin level (0.44) is now therapeutic after rate increase - will continue current heparin rate and follow-up AM heparin level and CBC. - H/H improving, Plts trending down - monitor - No significant bleeding reported  Goal of Therapy:  Heparin level 0.3-0.7 units/ml Monitor platelets by anticoagulation protocol: Yes   Plan:  1. Continue heparin drip 2500 units/hr (25 ml/hr) 2. Follow-up AM heparin level and CBC  Earleen Newport [phone] 01/06/2014,7:00 PM

## 2014-01-06 NOTE — Progress Notes (Signed)
FMTS Attending Daily Note:  Annabell Sabal MD  [phone] pager  Family Practice pager:  [phone] I have seen and examined this patient and have reviewed their chart. I have discussed this patient with the resident. I agree with the resident's findings, assessment and care plan.  Additionally:  - Patient doing well, no complaints this AM - Reports her mood is "up and down" but predominantly up - Plan is for thyroidectomy on Monday.  Appreciate ENT input  Alveda Reasons, MD 01/06/2014 9:59 AM

## 2014-01-06 NOTE — Progress Notes (Signed)
Family Medicine Teaching Service Daily Progress Note Intern Pager: [phone]  Patient name: Sarah [name] Medical record number: [id number] Date of birth: 1965-04-07 Age: 48 y.o. Gender: female  Primary Care Provider: No PCP Per Patient Consultants: Neurosurgery, IR, Oncology, Endocrinology Code Status: Full  Pt Overview and Major Events to Date:  - 9/22: Pt. Admitted with lower extremity weakness.  - 9/23: Pt. Found to have mass at T-3 level with spinal compression.  - 9/24: Surgery performed for decompression and biopsy overnight. Pt. Post-op and doing well. 3cm Suprahilar mass on CT Chest, PE also identified. ?Thyroid etiology, but much less likely for mets. Oncology consulted.  - 9/25 - 9/29: Awaiting pathology results with plan for oncology to see once path returns. Pt. With little neurological improvement. Started with light PT.  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 Oncology on board. Neurosurgery to comment on need for spinal stabilization surgery.  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 - 10/8: Underwent spinal stabilization surg. - 10/10: Restarted heparin  - 10/13: Dr. GerkiHarlow Asale to perform surg as inpatient; will seek other consultations - 10/14: Dr. RosenConstance HolsterNT consulted. He will see pt on 10/15. - 10/16: ENT to perform Thyroidectomy Monday 10/19  Assessment and Plan: VickiCHARESE [name] 48 y.70 female presenting with continued lower extremity weakness at baseline after discharge from hospital on 9/21. PMH is significant for obesity, HTN, and ?History of Hypothyroidism previously on Synthroid per patient.   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  Neurosurgery took her to the OR on 9/23, and biopsied as well as performed laminectomy for decompression. Surgical pathology with metastatic adenocarcinoma. Likely poor prognosis. At time of decompression, was thought to be most likely from lung mets. - Pain control:  Oxy IR prn. IV Morphine for breakthrough - Neurosurgery following: Patient was taken for spinal stabilization 10/9.  - Oncology following (Dr. MagriJana Hakimpatient; Dr. MohamJulien Nordmanntpatient); Neurosurgery (Dr. CabbeChristella Noadocrinology (Dr. Kerr)Buddy DutyT (Dr. 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  - Large thyroid goiter noted. TSH normal; ultrasound on 10/1, Biopsy on 10/2 - FNA results: "clusters of neoplastic cells w/ granular/oncogenic cytoplasm--similar to previous bx" - Dr. RosenConstance Holsterseen patient. Greatly appreciate the input. 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  - PT recommendations SNF with 24hour supervision. Will plan dispo based on goals of care discussion. - Per SW; SNF is an option, but will need estimated time of DC; pt currently would rather go home than to SNF but is willing to consider other options. - Foley catheter in place for urinary retention; Stooling regularly, no sxs of fecal retention.  - Discussed Recs for PT with Neurosurgery. Light PT at this point. Pt. Not to get UOB.  - TED hose  #Pulmonary Embolus - Found on CT Chest  Pt. With brief episodes of tachycardia, but otherwise asymptomatic with O2 sats 100% on  2L [place] >> now on Room Air.  Blood pressures stable. Lower extremity Venous Duplex negative for DVT.  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 ESR 59 (9/22)  - Now on IV heparin --  Will begin bridging as soon as possible (post surgical); waiting until thyroidectomy.  - Per pharm--recommendation for long-term Lovenox; Difficult due to self-pay, CM is working on solution. If it is possible, Dose would be 187m SQ q12h - Will continue to monitor VS. Stable at this time.  #Hyperglycemia -  Likely diabetic with PCOS given A1C of 6.7. CBG's 180's - 200s.  - Patient informed of elevated A1C and likely diagnosis of DMII. She agrees with uKoreathat this is a marginal issue at this point, and we will recommend treatment based upon our goals of care discussion.  - Continue to monitor.  - Resistant SSI; 15U Lantus. Will add 4U novolog TID w/ meals (per DM management)  # Anemia - unknown source; Hgb has been trending down slowly. 10/15 was 8.1. Trended back up to 8.9 on 10/16. Will monitor - FOBT pending; nursing staff informed - Will repeat CBC in AM - Will consider Iron and TIBC labs if downtrend continues  #Hypervirilization - with clitoromegaly noted on prior exam and buffalo hump and obesity as well as upper lip hair. PCOS vs. Adrenal patholog. Hypertensive and not hypotensive. Testing during this hospitalization after having recently received steroids would be largely non-diagnostic. Though, could also be a combination of isolated processes i.e. Clitoromegaly, Hirsutism 2/2 obesity. - Will leave for Outpatient f/u -- [place] assess ovaries vs 21-hydroxylase/ACTH/cortisol.  - TSH, Cortisol WNL - A1C : 6.7 - consistent with PCOS given evidence of cystic structures in pelvis.   # UTI, 2/2 foley catheter - UCx = Proteus  - dysuria and suprapubic pain noted PM 10/1. - Cipro 2593mBID (10/2 started) -- treated for 7 days -- DC 10/9.  #HTN- BP fairly well controlled -continue losartan.   #Obesity-  - consistent with potential PCOS.   FEN/GI: Reg diet; KVO Prophylaxis: on full dose IV heparin, SCDs  Disposition: Pending Goals of Care discussion and endocrinology recommendations.  Possible discharge early next week.  Subjective: Patient doing well this morning. No new developments overnight. Continues to have regular BMs. [name] joined by mother at bedside. We discussed the plans for (eventual) DC today. Patient and family seem to be leaning toward CIR. Pt just wanted to ask her father. I will inform CM about this today. Family is extremely pleased to have Dr. RoConstance Holsternboard for thyroidectomy. Spirits high.  Objective: Temp:  [97.7 F (36.5 C)-99.4 F (37.4 C)] 97.7 F (36.5 C) (10/16 0500) Pulse Rate:  [96-110] 110 (10/16 0500) Resp:  [17-18] 18 (10/16 0500) BP: (110-134)/(59-97) 129/69 mmHg (10/16 0500) SpO2:  [96 %-100 %] 99 % (10/16 0500) Physical Exam: General: NAD, AAOx3, morbidly obese  HEENT: Thyromegaly / Goiter noted on exam without tenderness, and no nodules, no LAD, neck supple, J-line in place, lipoma/cyst on forehead Cardio: distant heart sounds Lungs: CTA without wheezes. Abdomen: Soft and nontender, mildly distended, No organomegaly noted.  Extremities: no edema,  No calf tenderness, Skin: No lesions or rashes noted Neuro: cranial nerves grossly normal. Normal speech;No new changes. Sensation greatly diminished from umbilicus to toes. 0/5 strength bilaterally below waist. Otherwise no new sensory/motor deficits. Neuro exam is at new baseline.   Laboratory:  Recent Labs Lab 01/04/14 0425 01/05/14 0715 01/06/14 0525  WBC 6.6 5.5 5.3  HGB 8.5* 8.1*  8.9*  HCT 24.6* 24.1* 25.9*  PLT 148* 137* 128*    Recent Labs Lab 01/05/14 0715  NA 131*  K 3.8  CL 95*  CO2 26  BUN 9  CREATININE 0.71  CALCIUM 8.0*  PROT 5.1*  BILITOT 1.0  ALKPHOS 273*  ALT 38*  AST 32  GLUCOSE 150*   Urinalysis    Component Value Date/Time   COLORURINE AMBER* 12/23/2013 1058   APPEARANCEUR TURBID* 12/23/2013 1058   LABSPEC 1.018 12/23/2013 1058   PHURINE 7.5 12/23/2013 1058   GLUCOSEU NEGATIVE 12/23/2013 1058   HGBUR LARGE* 12/23/2013 1058   BILIRUBINUR  NEGATIVE 12/23/2013 1058   KETONESUR NEGATIVE 12/23/2013 1058   PROTEINUR 100* 12/23/2013 1058   UROBILINOGEN 0.2 12/23/2013 1058   NITRITE NEGATIVE 12/23/2013 1058   LEUKOCYTESUR LARGE* 12/23/2013 1058    Phos - 3.9 Mg - 2.2 CRP - 11.6 ESR - 59 CK - 56 B12 - 209 Random Cortisol - 25.4 A1C - 6.7 TSH - 2.13 RPR - nonreactive HIV - nonreactive UPT - Negative  Imaging/Diagnostic Tests:  CT Chest Abd/ Pelvis 9/23:  CLINICAL DATA: Destructive metastatic lesion of the T3 vertebral  body with no known primary carcinoma.  EXAM:  CT CHEST, ABDOMEN, AND PELVIS WITH CONTRAST  TECHNIQUE:  Multidetector CT imaging of the chest, abdomen and pelvis was  performed following the standard protocol during bolus  administration of intravenous contrast.  CONTRAST: 136m OMNIPAQUE IOHEXOL 300 MG/ML SOLN  COMPARISON: MRI of the cervical spine earlier today.  FINDINGS:  CT CHEST FINDINGS  Within the chest, a right suprahilar mass measures approximately 3.0  x 2.7 x 2.2 cm. This either represents a nodal metastasis or central  lung mass such as small cell carcinoma. A number of smaller  mediastinal lymph nodes are also identified in the right  paratracheal region, prevascular space and AP window.  Destructive lesion of the T3 vertebral body noted which nearly  replaces the entire vertebral body with tumor extension posteriorly  into the epidural space causing significant compression of the  spinal cord. Paravertebral extension of tumor is also suspected  lateral and anterior to the vertebral body, right greater than left.  Massive enlargement of the thyroid gland present with transverse  dimensions of approximately 7.7 x 8.5 cm. The thyroid gland is  incompletely visualized as the neck was not included. Central  dystrophic calcification identified. The enlarged thyroid gland does  cause significant mass effect on the airway with evidence of  tracheal deviation to the right and tracheal narrowing  at the  thoracic inlet. The enlarged thyroid gland extends into the superior  mediastinum.  There is evidence of pulmonary embolism with nonocclusive thrombus  identified in the right middle lobe and lower lobe pulmonary  arteries. Pulmonary arterial opacification is not optimal on the  standard venous phase evaluation of the chest but the right sided  pulmonary embolism is clearly present.  There are small bilateral pulmonary nodules. Right-sided pulmonary  nodule measuring 6 mm likely localizes to the very top of the lower  lobe near the major fissure. Left upper lobe nodule measures 7 mm.  Lingular nodule measures 7 mm. Left lower lobe nodule measures 7 mm.  Faint peripheral right middle lobe nodule measures 3 mm. Inferior  right middle lobe nodule measures 6 mm. Several small nodules in the  posterior right upper lobe identified in the 3-4 mm range.  No evidence of pleural or pericardial fluid. No other bony lesions  identified in the thorax.  CT ABDOMEN AND PELVIS FINDINGS  Small low-density lesion in the left lobe of the liver measures 7 mm  and likely represents a cyst. No other hepatic abnormalities. The  gallbladder, pancreas, spleen, adrenal glands and kidneys are  unremarkable. No adenopathy is identified in the abdomen or pelvis.  Bowel shows normal caliber without evidence of obstruction, lesion  or inflammation. No abnormal fluid collections.  The bladder is moderately distended. Elongated cystic changes in  both adnexal regions identified which may represent a combination of  ovarian cysts and potentially hydrosalpinx. Correlation with pelvic  ultrasound is recommended. No hernias are identified. The uterus  appears unremarkable by CT. No bony lesions are identified in the  abdomen or pelvis.  IMPRESSION:  1. Destructive T3 metastasis as seen on MRI nearly completely  replaces the vertebral body and causes significant spinal cord  compression with epidural extension of  tumor.  2. 3 cm right suprahilar mass may represent nodal metastasis versus  primary central tumor. Next number multiple small bilateral  pulmonary nodules consistent with metastatic disease.  3. Acute nonocclusive pulmonary embolism in the right middle lobe  and right lower lobe pulmonary arteries.  4. Massive thyroid goiter causing tracheal deviation to the right  and narrowing of the trachea.  5. Tubular cystic abnormalities of both adnexal regions which may be  a combination of cystic changes in the ovaries and potentially  hydrosalpinx. Correlation recommended with pelvic ultrasound to  exclude any suspicious cystic lesions of the adnexae.  6. Critical Value/emergent results were called by telephone at the  time of interpretation on 12/14/2013 at 4:52 pm to Dr. Tamala Julian of the  [facility] Medicine Teaching Service, who verbally acknowledged these  results.  Electronically Signed  By: Aletta Edouard M.D.  On: 12/14/2013 16:53   MRI T-spine 9/23:  CLINICAL DATA: Lower extremity weakness. Worsening back pain.  Compressive upper thoracic mass partially visualized on cervical  spine MRI as well as chest CT with spinal cord compression.  EXAM:  MRI THORACIC SPINE WITHOUT CONTRAST  TECHNIQUE:  Multiplanar, multisequence MR imaging of the thoracic spine was  performed. No intravenous contrast was administered.  COMPARISON: Cervical spine MRI and chest CT earlier today  FINDINGS:  There is a T3 compression fracture with approximately 50% height  loss centrally. As described on cervical spine MRI and chest CT  earlier today, there is abnormal soft tissue replacing the majority  of the T3 vertebral body extending into the pedicles as well as into  the ventral epidural space with moderate cord compression. Epidural  extension is slightly greater on the right and extends into the  inferior aspect of the right T2-3 neural foramen as well as superior  aspect of the right T3-4 neural foramen.  There is also abnormal  paravertebral soft tissue laterally and anteriorly as seen on CT.  Central ventral epidural material posterior to the T4 vertebral body  may represent tumor or hematoma. Mild T2 hyperintensity is present  in the spinal cord at the T3 level extending inferiorly to the T4-5  disc space level consistent with mild edema. Mild edema is present  in the posterior paraspinal soft tissues of the upper thoracic  spine. No other vertebral body lesions are identified.  There is a moderate-sized central disc extrusion at T7-8 which  contacts and mildly flattens the ventral spinal cord with mild  spinal stenosis. A small left central disc protrusion is present at  T8-9 with at most minimal impression on the spinal cord. Massive  enlargement of the left thyroid lobe is partially visualized.  Mediastinal and lung findings are better demonstrated on recent  chest CT.  IMPRESSION:  1. T3 vertebral body mass with pathologic compression fracture and  epidural tumor resulting in moderate cord compression with evidence  of mild spinal cord edema.  2. T7-8 disc extrusion resulting in mild spinal stenosis.   MRI Head / C-Spine 9/23:  CLINICAL DATA: 48 year old female who presented with back pain and  lower extremity numbness, with continued inadequately explained  lower extremity weakness following Hospital discharge on 12/12/2013.  Initial encounter.  EXAM:  MRI HEAD WITHOUT AND WITH CONTRAST  MRI CERVICAL SPINE WITHOUT AND WITH CONTRAST  TECHNIQUE:  Multiplanar, multiecho pulse sequences of the brain and surrounding  structures, and cervical spine, to include the craniocervical  junction and cervicothoracic junction, were obtained without and  with intravenous contrast.  CONTRAST: 20 mL MultiHance.  COMPARISON: Lumbar MRI 12/10/2013.  FINDINGS:  MRI HEAD FINDINGS  Cerebral volume is normal. No restricted diffusion to suggest acute  infarction. No midline shift, mass effect,  evidence of mass lesion,  ventriculomegaly, extra-axial collection or acute intracranial  hemorrhage. Cervicomedullary junction and pituitary are within  normal limits. Major intracranial vascular flow voids are preserved.  Pearline Cables and white matter signal is within normal limits for age  throughout the brain. No abnormal enhancement identified, pulsation  artifact prominent on axial post-contrast images.  Large body habitus. Small right frontal scalp lipoma (series 10,  image 37). Visible internal auditory structures appear normal. Mild  right mastoid fluid. Minor paranasal sinus mucosal thickening.  Visualized orbit soft tissues are within normal limits. Partially  visible thyromegaly greater on the left, see below. Normal bone  marrow signal.  MRI CERVICAL SPINE FINDINGS  Large body habitus resulting in wrap artifact on axial images.  However, there is massive thyroid enlargement evident, with the left  lobe measuring at least 7.2 x 5.2 x 11.2 cm (AP by transverse by  CC). The trachea is displaced to the right at the thoracic inlet and  mildly compressed. There is other regional mass effect including  sheath. On the left carotid  Fairly preserved cervical lordosis. No marrow edema or evidence of  acute osseous abnormality. Cervicomedullary junction is within  normal limits. Spinal cord signal is within normal limits at all  visualized levels. No abnormal intradural enhancement.  C2-C3: Negative.  C3-C4: Disc osteophyte complex, facet and ligament flavum  hypertrophy. Along with a degree of congenital canal narrowing,  there is mild spinal stenosis and moderate biforaminal stenosis.  C4-C5: Mild uncovertebral and facet hypertrophy. Mild right C5  foraminal stenosis.  C5-C6: Circumferential disc osteophyte complex. Along with a degree  of congenital canal narrowing there is mild spinal stenosis.  Moderate right C6 foraminal stenosis.  C6-C7: Negative except for mild facet hypertrophy.   C7-T1: Negative.  T1-T2: Negative.  Faintly visualized at the T3 level in the upper thoracic spine there  is a lobulated solidly enhancing mass which appears to extend into  the epidural space from the T3 vertebral body. This is inseparable  from the spinal cord which is likely compressed. See series 2000  image 7 and series 1500, image 6. This is not included on axial  images.  IMPRESSION:  1. Faintly visible but aggressive appearing enhancing mass at the T3  level of the upper thoracic spine, likely with spinal cord  compression evaluate further are with thoracic MRI without and with  contrast.  2. Normal for age  MRI appearance of the brain.  3. Massive thyroid enlargement, greater on the left. Left thyroid  goiter/mass measuring at least 7.2 x 5.2 x 11.2 cm. Mass effect at  the thoracic inlet including on the trachea.  4. Mild chronic disc and endplate degeneration in the cervical spine  at C3-C4 and C5-C6 resulting in borderline to mild spinal stenosis,  but no cervical spinal cord mass effect or signal abnormality.  Electronically Signed:  By: Lars Pinks M.D.  On: 12/14/2013 09:50   MRI L-Spine 9/19:  CLINICAL DATA: Back pain with bilateral leg numbness, worsening  with oral steroids.  EXAM:  MRI LUMBAR SPINE WITHOUT CONTRAST  TECHNIQUE:  Multiplanar, multisequence MR imaging of the lumbar spine was  performed. No intravenous contrast was administered.  COMPARISON: CT 12/09/2013  FINDINGS:  There is no degenerative disease at L2-3 or above. The discs are  normal in signal characteristics and morphology. The spinal canal  and foramina are widely patent. The distal cord and conus are normal  with conus tip at L1. There are small benign and insignificant  hemangiomas within the L2 and L3 vertebral bodies.  L3-4: Minimal desiccation of the disc. Mild circumferential bulging  slightly more prominent towards the left. Bilateral facet  degeneration and hypertrophy. Mild  narrowing of the lateral recesses  without apparent neural compression. Mild foraminal narrowing on the  left without apparent neural compression.  L4-5: Desiccation of the disc with circumferential protrusion.  Bilateral facet and ligamentous hypertrophy. Stenosis of both  lateral recesses without definite focal neural compression. Mild  foraminal narrowing bilaterally.  L5-S1: Desiccation of the disc with loss of height. Endplate  osteophytes and bulging of the disc more prominent towards the left.  Bilateral facet degeneration and hypertrophy left more than right.  Stenosis of the subarticular lateral recess on the left that could  compress the S1 nerve root. Mild foraminal narrowing left more than  right but without apparent compression of the exiting L5 nerve  roots.  Serpiginous fluid-filled structure in the left pelvis could  represent hydrosalpinx. This is not primarily or completely imaged.  IMPRESSION:  Degenerative disc disease and degenerative facet disease in the  lower lumbar spine that could be a cause of lumbago.  Lateral recess and foraminal narrowing at L3-4, L4-5 and L5-S1 as  described above that could be a cause of focal neural compression.  In general, this is more pronounced on the left than on the right.  In particular, there is potential for left S1 nerve root compression  in the subarticular lateral recess at L5-S1.  Electronically Signed  By: Nelson Chimes M.D.  On: 12/10/2013 08:45   CT L-Spine 9/18:  CLINICAL DATA: Low back pain with difficulty walking.  EXAM:  CT LUMBAR SPINE WITHOUT CONTRAST  TECHNIQUE:  Multidetector CT imaging of the lumbar spine was performed without  intravenous contrast administration. Multiplanar CT image  reconstructions were also generated.  COMPARISON: None.  FINDINGS:  There is no fracture or spondylolisthesis. There is moderately  severe disc space narrowing at L5-S1 with slight vacuum phenomenon  at this level. There is  moderate narrowing at L4-5. There are  prominent anterior osteophytes at L2, L3, L4, and L5.  At T12-L1, there is moderate facet hypertrophy bilaterally. There is  no nerve root edema or effacement. No disc extrusion or stenosis.  At L1-2, there is moderate facet hypertrophy bilaterally. There is  mild diffuse disc bulging. There is no nerve root edema or  effacement. No disc extrusion  or stenosis.  At L2-3, there is broad-based disc bulging and moderate facet  hypertrophy bilaterally. There is borderline narrowing of the thecal  sac with borderline spinal stenosis. No disc extrusion.  At L3-4, there is severe facet osteoarthritic change bilaterally,  more pronounced on the left than on the right. There is broad-based  disc bulging and moderate ligamentum flavum hypertrophy. These  findings in concert lead to moderate generalized spinal stenosis.  At L4-5, there is broad-based disc protrusion and severe facet  osteoarthritic change bilaterally. These findings in concert lead to  moderate generalized spinal stenosis. No frank disc extrusion.  At L5-S1, there is severe facet osteoarthritic change on the left  and moderate facet osteoarthritic change on the right. There is  broad-based disc protrusion. There is exit foraminal narrowing  bilaterally, more severe on the left than on the right, due to  osteophytic change. No frank disc extrusion or stenosis is seen at  this level.  There are no paraspinous lesions.  IMPRESSION:  Spinal stenosis at L3-4 and L4-5, multifactorial. Borderline spinal  stenosis at L2-3. There does appear to be a degree of congenital  narrowing of the canal which contributes to spinal stenosis.  Multilevel arthropathy.  No frank disc extrusion.  No fracture or spondylolisthesis.  Electronically Signed  By: Lowella Grip M.D.  On: 12/09/2013 13:50   CXR 9/13:  CLINICAL DATA: Upper back pain for 5 weeks. Dyspnea on exertion.  Nonsmoker. History of  hypertension.  EXAM:  CHEST 2 VIEW  COMPARISON: 04/15/2006  FINDINGS:  The heart is mildly enlarged. There is elevation of right  hemidiaphragm, stable in appearance. There is streaky density  favored to be within the right lower lobe, consistent with  atelectasis or infiltrate. No pulmonary edema. No pleural effusions.  There are moderate degenerative changes in the thoracic spine.  IMPRESSION:  Right lower lobe atelectasis or infiltrate.  Electronically Signed  By: Shon Hale M.D.  On: 12/04/2013 19:26  Venous Duplex 9/24:  Summary:  - No evidence of deep vein thrombosis involving the right lower extremity. - No evidence of deep vein thrombosis involving the left lower extremity.   Surgical Pathology Report: 12/21/2013 Diagnosis Soft tissue mass, simple excision, Thoracic Epidural Tumor - METASTATIC CARCINOMA. PLEASE SEE COMMENT. Microscopic Comment There are clusters of malignant cells with abundant eosinophilic cytoplasm. A battery of immunostains were performed and the tumor cells show the following profile: Cytokeratin 7 - strongly positive Cytokeratin 8/18 - strongly positive TTF-1 - strongly positive. Thyroglobulin - negative CD10 - negative Cytokeratin 20 - negative S100 - negative Vimentin - negative The control stained appropriately. The overall findings are diagnostic for metastatic carcinoma. 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 However, a metastatic oncocytic thyroid carcinoma is still in the differential. Clinical and radiographic correlation is highly recommended. (HCL:kh 12-19-13) Aldona Bar MD Pathologist, Electronic Signature (Case signed 12/20/2013)  Elberta Leatherwood, MD 01/06/2014, 8:58 AM PGY-1, [place] Intern pager: [phone], text pages welcome

## 2014-01-06 NOTE — Progress Notes (Signed)
Clinical Social Worker continues to follow patient. Patient discussed in progression this morning for possible candidacy to [facility] vs Home with Hospice. Patient faxed out and has received bed offers, etc for SNF back up. CSW remains available as needed/ for support.   Glendon Axe, MSW, LCSWA [phone] 01/06/2014 2:11 PM

## 2014-01-07 LAB — TECHNOLOGIST SMEAR REVIEW

## 2014-01-07 LAB — CBC
HCT: 25.5 % — ABNORMAL LOW (ref 36.0–46.0)
HEMOGLOBIN: 8.6 g/dL — AB (ref 12.0–15.0)
MCH: 29.2 pg (ref 26.0–34.0)
MCHC: 33.7 g/dL (ref 30.0–36.0)
MCV: 86.4 fL (ref 78.0–100.0)
Platelets: 111 10*3/uL — ABNORMAL LOW (ref 150–400)
RBC: 2.95 MIL/uL — ABNORMAL LOW (ref 3.87–5.11)
RDW: 14.6 % (ref 11.5–15.5)
WBC: 4 10*3/uL (ref 4.0–10.5)

## 2014-01-07 LAB — APTT: aPTT: 163 seconds — ABNORMAL HIGH (ref 24–37)

## 2014-01-07 LAB — COMPREHENSIVE METABOLIC PANEL
ALT: 54 U/L — AB (ref 0–35)
AST: 38 U/L — AB (ref 0–37)
Albumin: 1.8 g/dL — ABNORMAL LOW (ref 3.5–5.2)
Alkaline Phosphatase: 398 U/L — ABNORMAL HIGH (ref 39–117)
Anion gap: 11 (ref 5–15)
BUN: 7 mg/dL (ref 6–23)
CO2: 29 meq/L (ref 19–32)
CREATININE: 0.67 mg/dL (ref 0.50–1.10)
Calcium: 8.3 mg/dL — ABNORMAL LOW (ref 8.4–10.5)
Chloride: 94 mEq/L — ABNORMAL LOW (ref 96–112)
GFR calc Af Amer: >90 mL/min (ref >90)
Glucose, Bld: 196 mg/dL — ABNORMAL HIGH (ref 70–99)
Potassium: 3.8 mEq/L (ref 3.7–5.3)
SODIUM: 134 meq/L — AB (ref 137–147)
Total Bilirubin: 0.7 mg/dL (ref 0.3–1.2)
Total Protein: 5.6 g/dL — ABNORMAL LOW (ref 6.0–8.3)

## 2014-01-07 LAB — LACTATE DEHYDROGENASE: LDH: 262 U/L — ABNORMAL HIGH (ref 94–250)

## 2014-01-07 LAB — GLUCOSE, CAPILLARY
GLUCOSE-CAPILLARY: 118 mg/dL — AB (ref 70–99)
GLUCOSE-CAPILLARY: 168 mg/dL — AB (ref 70–99)
GLUCOSE-CAPILLARY: 175 mg/dL — AB (ref 70–99)
Glucose-Capillary: 188 mg/dL — ABNORMAL HIGH (ref 70–99)

## 2014-01-07 LAB — HEPARIN LEVEL (UNFRACTIONATED): Heparin Unfractionated: 0.56 IU/mL (ref 0.30–0.70)

## 2014-01-07 LAB — PROTIME-INR
INR: 1.08 (ref 0.00–1.49)
Prothrombin Time: 14.1 seconds (ref 11.6–15.2)

## 2014-01-07 NOTE — Progress Notes (Signed)
No issues overnight. Pt has no new complaints.   EXAM:  BP 119/74  Pulse 94  Temp(Src) 98.1 F (36.7 C) (Oral)  Resp 16  Ht 5\' 11"  (1.803 m)  Wt 142.4 kg (313 lb 15 oz)  BMI 43.80 kg/m2  SpO2 100%  LMP 11/28/2013  Awake, alert, oriented  Speech fluent, appropriate  CN grossly intact  Good strength BUE, no movement BLE  IMPRESSION:  48 y.o. female s/p thoracic decompression/stabilization for spinal thyroid mets - Stable neurologic exam with paraplegia  PLAN: - Thyroidectomy planned this week.

## 2014-01-07 NOTE — Progress Notes (Signed)
ANTICOAGULATION CONSULT NOTE - Follow Up Consult  Pharmacy Consult for Heparin Indication: pulmonary embolus  Allergies  Allergen Reactions  . Tramadol Nausea And Vomiting    Patient Measurements: Height: 5\' 11"  (180.3 cm) Weight: 313 lb 15 oz (142.4 kg) IBW/kg (Calculated) : 70.8 Heparin Dosing Weight: 105kg  Vital Signs: Temp: 98.4 F (36.9 C) (10/17 1408) Temp Source: Oral (10/17 1408) BP: 115/62 mmHg (10/17 1408) Pulse Rate: 99 (10/17 1408)  Labs:  Recent Labs  01/05/14 0715  01/06/14 0525 01/06/14 1330 01/07/14 0550 01/07/14 1304  HGB 8.1*  --  8.9*  --  8.6*  --   HCT 24.1*  --  25.9*  --  25.5*  --   PLT 137*  --  128*  --  111*  --   APTT  --   --   --   --   --  163*  LABPROT  --   --   --   --   --  14.1  INR  --   --   --   --   --  1.08  HEPARINUNFRC 0.36  < > 0.27* 0.44 0.56  --   CREATININE 0.71  --   --   --   --  0.67  < > = values in this interval not displayed.  Estimated Creatinine Clearance: 136.4 ml/min (by C-G formula based on Cr of 0.67).   Medications:  Heparin @ 2500 units/hr  Assessment: 60 YOF who continues on heprin for RLL PE while awaiting thyroidectomy scheduled for 10/19. Heparin level reamsin therapeutic this AM (HL 0.56 <<0.44, goal of 0.3-0.7). CBC low but stable - no overt s/sx of bleeding noted.   Goal of Therapy:  Heparin level 0.3-0.7 units/ml Monitor platelets by anticoagulation protocol: Yes   Plan:  1. Continue heparin at current rate of units/hr (25 ml/hr) 2. Will continue to monitor for any signs/symptoms of bleeding and will follow up with heparin level in the a.m.   Alycia Rossetti, PharmD, BCPS Clinical Pharmacist Pager: [phone] 01/07/2014 4:28 PM

## 2014-01-07 NOTE — Progress Notes (Signed)
Seen and examined.  Agree with the documentation and management of Dr. Berkley Harvey.  For thyroid resection tomorrow.  We are evaluating the evolving pancytopenia.

## 2014-01-07 NOTE — Progress Notes (Signed)
Family Medicine Teaching Service Daily Progress Note Intern Pager: [phone]  Patient name: Sarah [name] Medical record number: [id number] Date of birth: Nov 29, 1965 Age: 48 y.o. Gender: female  Primary Care Provider: No PCP Per Patient Consultants: Neurosurgery, IR, Oncology, Endocrinology Code Status: Full  Pt Overview and Major Events to Date:  - 9/22: Pt. Admitted with lower extremity weakness.  - 9/23: Pt. Found to have mass at T-3 level with spinal compression.  - 9/24: Surgery performed for decompression and biopsy overnight. Pt. Post-op and doing well. 3cm Suprahilar mass on CT Chest, PE also identified. ?Thyroid etiology, but much less likely for mets. Oncology consulted.  - 9/25 - 9/29: Awaiting pathology results with plan for oncology to see once path returns. Pt. With little neurological improvement. Started with light PT.  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 Oncology on board. Neurosurgery to comment on need for spinal stabilization surgery.  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 - 10/8: Underwent spinal stabilization surg. - 10/10: Restarted heparin  - 10/13: Dr. GerkiHarlow Asale to perform surg as inpatient; will seek other consultations - 10/14: Dr. RosenConstance HolsterNT consulted. He will see pt on 10/15. - 10/16: ENT to perform Thyroidectomy Monday 10/19 - 10/17: All cell lines (WBC, Hgb, Plts) continue to drop: Checking Labs as below  Assessment and Plan: Sarah [name] 48 y.12 female presenting with continued lower extremity weakness at baseline after discharge from hospital on 9/21. PMH is significant for obesity, HTN, and ?History of Hypothyroidism previously on Synthroid per  patient.   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  Neurosurgery took her to the OR on 9/23, and biopsied as well as performed laminectomy for decompression. Surgical pathology with metastatic adenocarcinoma. Likely poor prognosis. At time of decompression, was thought to be most likely from lung mets. - Pain control:  Oxy IR prn. IV Morphine for breakthrough - Neurosurgery following: Patient was taken for spinal stabilization 10/9.  - Oncology following (Dr. MagriJana Hakimpatient; Dr. MohamJulien Nordmanntpatient); Neurosurgery (Dr. CabbeChristella Noadocrinology (Dr. Kerr)Buddy DutyT (Dr. 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  - Large thyroid goiter noted. TSH normal; ultrasound on 10/1, Biopsy on 10/2 - FNA results: "clusters of neoplastic cells w/ granular/oncogenic cytoplasm--similar to previous bx" - Dr. RosenConstance Holsterseen patient. Greatly appreciate the input. - Thyroidectomy scheduled 10/19; IV [facility] @ 0000  # Paralysis w/ partial sensory loss of bilateral LE 2/2 compression of metastatic spinal mass.  - PT recommendations CIR vs SNF with 24hour supervision. Will plan dispo based on goals of care discussion. - Per SW; SNF is an option, but will need estimated time of DC; pt currently would rather go home than to SNF but is willing to consider other options. - Foley catheter in place for urinary retention; Stooling regularly, no sxs of fecal retention.  - Discussed Recs for PT with Neurosurgery. Light PT at this point. Pt. Not to get UOB.  - TED hose  #Pulmonary Embolus - Found on  CT Chest  Pt. With brief episodes of tachycardia, but otherwise asymptomatic with O2 sats 100% on 2L [place] >> now on Room Air.  Blood pressures stable. Lower extremity Venous Duplex negative for DVT.  -  Hypercoagulable 2/2 neoplasia at this point. ESR 59 (9/22)  - Now on IV heparin -- Will begin bridging as soon as possible (post surgical); waiting until thyroidectomy.  - Per pharm--recommendation for long-term Lovenox; Difficult due to self-pay, CM is working on solution. If it is possible, Dose would be 159m SQ q12h - Will continue to monitor VS. Stable at this time.  # Anemia and Thrombocytopenia: Hgb and Plts have been trending down slowly since spinal stabilization surgery on 10/7 (Hgb 12.3) Hgb currently stable @  8.6 on 10/17. Plts and WBC continue down trending. retic production index = 0.6 indicating inadequate bone marrow response. 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 No signs of end organ damage so low suspicion for TTP, DIC - FOBT pending; nursing staff informed - Continue to follow CBC - Check: PT, PTT, blood smear, CMET, LDH, Haptoglobin  - consider HIT panel and d/cing Heparin - Will consider Iron, TIBC, B12, and folate if Hgb downtrend continues  #Hyperglycemia -  Likely diabetic with PCOS given A1C of 6.7. CBG's 180's - 200s.  - Patient informed of elevated A1C and likely diagnosis of DMII. She agrees with uKoreathat this is a marginal issue at this point, and we will recommend treatment based upon our goals of care discussion.  - Continue to monitor.  - Resistant SSI; 15U Lantus.  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 PCOS vs. Adrenal patholog. Hypertensive and not hypotensive. Testing during this hospitalization after having recently received steroids would be largely non-diagnostic. Though, could also be a combination of isolated processes i.e. Clitoromegaly, Hirsutism 2/2 obesity. - Will leave for Outpatient f/u -- [place] assess ovaries vs 21-hydroxylase/ACTH/cortisol.  - TSH, Cortisol WNL - A1C : 6.7 -  consistent with PCOS given evidence of cystic structures in pelvis.   # UTI, 2/2 foley catheter - UCx = Proteus - Resolved - dysuria and suprapubic pain noted PM 10/1. - Cipro 2536mBID (10/2 started) -- treated for 7 days -- DC 10/9.  #HTN- BP well controlled -continue losartan.   #Obesity-  - consistent with potential PCOS.   FEN/GI: Reg diet; KVO Prophylaxis: on full dose IV heparin, SCDs  Disposition: Pending Goals of Care discussion and endocrinology recommendations. Possible discharge early next week.  Subjective: Patient doing well this morning. No new developments overnight. Denies CP, SOB. Pain well controlled. Awaiting thyroid surgery. Desires CIR if qualifies.   Objective: Temp:  [97.8 F (36.6 C)-98.6 F (37 C)] 98.3 F (36.8 C) (10/17 0700) Pulse Rate:  [89-119] 96 (10/17 0700) Resp:  [16-20] 16 (10/17 0700) BP: (106-135)/(53-90) 132/72 mmHg (10/17 0700) SpO2:  [93 %-100 %] 100 % (10/17 0700) Physical Exam: General: NAD, AAOx3, morbidly obese  HEENT: Thyromegaly / Goiter noted on exam without tenderness, and no nodules, no LAD, neck supple, J-line in place, lipoma/cyst on forehead Cardio: distant heart sounds Lungs: CTA without wheezes. Abdomen: Soft and nontender, mildly distended, No organomegaly noted.  Extremities: no edema,  No calf tenderness, Skin: No lesions or rashes noted Neuro: cranial nerves grossly normal. Normal speech;No new changes. Sensation greatly diminished from umbilicus to toes.  0/5 strength bilaterally below waist. Otherwise no new sensory/motor deficits. Neuro exam is at new baseline.   Laboratory:  Recent Labs Lab 01/05/14 0715 01/06/14 0525 01/07/14 0550  WBC 5.5 5.3 4.0  HGB 8.1* 8.9* 8.6*  HCT 24.1* 25.9* 25.5*  PLT 137* 128* 111*    Recent Labs Lab 01/05/14 0715  NA 131*  K 3.8  CL 95*  CO2 26  BUN 9  CREATININE 0.71  CALCIUM 8.0*  PROT 5.1*  BILITOT 1.0  ALKPHOS 273*  ALT 38*  AST 32  GLUCOSE 150*    Urinalysis    Component Value Date/Time   COLORURINE AMBER* 12/23/2013 1058   APPEARANCEUR TURBID* 12/23/2013 1058   LABSPEC 1.018 12/23/2013 1058   PHURINE 7.5 12/23/2013 1058   GLUCOSEU NEGATIVE 12/23/2013 1058   HGBUR LARGE* 12/23/2013 1058   BILIRUBINUR NEGATIVE 12/23/2013 1058   KETONESUR NEGATIVE 12/23/2013 1058   PROTEINUR 100* 12/23/2013 1058   UROBILINOGEN 0.2 12/23/2013 1058   NITRITE NEGATIVE 12/23/2013 1058   LEUKOCYTESUR LARGE* 12/23/2013 1058    Phos - 3.9 Mg - 2.2 CRP - 11.6 ESR - 59 CK - 56 B12 - 209 Random Cortisol - 25.4 A1C - 6.7 TSH - 2.13 RPR - nonreactive HIV - nonreactive UPT - Negative  Imaging/Diagnostic Tests:  CT Chest Abd/ Pelvis 9/23:  CLINICAL DATA: Destructive metastatic lesion of the T3 vertebral  body with no known primary carcinoma.  EXAM:  CT CHEST, ABDOMEN, AND PELVIS WITH CONTRAST  TECHNIQUE:  Multidetector CT imaging of the chest, abdomen and pelvis was  performed following the standard protocol during bolus  administration of intravenous contrast.  CONTRAST: 176m OMNIPAQUE IOHEXOL 300 MG/ML SOLN  COMPARISON: MRI of the cervical spine earlier today.  FINDINGS:  CT CHEST FINDINGS  Within the chest, a right suprahilar mass measures approximately 3.0  x 2.7 x 2.2 cm. This either represents a nodal metastasis or central  lung mass such as small cell carcinoma. A number of smaller  mediastinal lymph nodes are also identified in the right  paratracheal region, prevascular space and AP window.  Destructive lesion of the T3 vertebral body noted which nearly  replaces the entire vertebral body with tumor extension posteriorly  into the epidural space causing significant compression of the  spinal cord. Paravertebral extension of tumor is also suspected  lateral and anterior to the vertebral body, right greater than left.  Massive enlargement of the thyroid gland present with transverse  dimensions of approximately 7.7 x 8.5 cm. The thyroid  gland is  incompletely visualized as the neck was not included. Central  dystrophic calcification identified. The enlarged thyroid gland does  cause significant mass effect on the airway with evidence of  tracheal deviation to the right and tracheal narrowing at the  thoracic inlet. The enlarged thyroid gland extends into the superior  mediastinum.  There is evidence of pulmonary embolism with nonocclusive thrombus  identified in the right middle lobe and lower lobe pulmonary  arteries. Pulmonary arterial opacification is not optimal on the  standard venous phase evaluation of the chest but the right sided  pulmonary embolism is clearly present.  There are small bilateral pulmonary nodules. Right-sided pulmonary  nodule measuring 6 mm likely localizes to the very top of the lower  lobe near the major fissure. Left upper lobe nodule measures 7 mm.  Lingular nodule measures 7 mm. Left lower lobe nodule measures 7 mm.  Faint peripheral right middle lobe nodule measures 3 mm. Inferior  right middle  lobe nodule measures 6 mm. Several small nodules in the  posterior right upper lobe identified in the 3-4 mm range.  No evidence of pleural or pericardial fluid. No other bony lesions  identified in the thorax.  CT ABDOMEN AND PELVIS FINDINGS  Small low-density lesion in the left lobe of the liver measures 7 mm  and likely represents a cyst. No other hepatic abnormalities. The  gallbladder, pancreas, spleen, adrenal glands and kidneys are  unremarkable. No adenopathy is identified in the abdomen or pelvis.  Bowel shows normal caliber without evidence of obstruction, lesion  or inflammation. No abnormal fluid collections.  The bladder is moderately distended. Elongated cystic changes in  both adnexal regions identified which may represent a combination of  ovarian cysts and potentially hydrosalpinx. Correlation with pelvic  ultrasound is recommended. No hernias are identified. The uterus   appears unremarkable by CT. No bony lesions are identified in the  abdomen or pelvis.  IMPRESSION:  1. 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  2. 3 cm right suprahilar mass may represent nodal metastasis versus  primary central tumor. Next number multiple small bilateral  pulmonary nodules consistent with metastatic disease.  3. Acute nonocclusive pulmonary embolism in the right middle lobe  and right lower lobe pulmonary arteries.  4. Massive thyroid goiter causing tracheal deviation to the right  and narrowing of the trachea.  5. Tubular cystic abnormalities of both adnexal regions which may be  a combination of cystic changes in the ovaries and potentially  hydrosalpinx. Correlation recommended with pelvic ultrasound to  exclude any suspicious cystic lesions of the adnexae.  6. Critical Value/emergent results were called by telephone at the  time of interpretation on 12/14/2013 at 4:52 pm to Dr. Tamala Julian of the  [facility] Medicine Teaching Service, who verbally acknowledged these  results.  Electronically Signed  By: Aletta Edouard M.D.  On: 12/14/2013 16:53   MRI T-spine 9/23:  CLINICAL DATA: Lower extremity weakness. Worsening back pain.  Compressive upper thoracic mass partially visualized on cervical  spine MRI as well as chest CT with spinal cord compression.  EXAM:  MRI THORACIC SPINE WITHOUT CONTRAST  TECHNIQUE:  Multiplanar, multisequence MR imaging of the thoracic spine was  performed. No intravenous contrast was administered.  COMPARISON: Cervical spine MRI and chest CT earlier today  FINDINGS:  There is a T3 compression fracture with approximately 50% height  loss centrally. As described on cervical spine MRI and chest CT  earlier today, there is abnormal soft tissue replacing the majority  of the T3 vertebral body extending into the  pedicles as well as into  the ventral epidural space with moderate cord compression. Epidural  extension is slightly greater on the right and extends into the  inferior aspect of the right T2-3 neural foramen as well as superior  aspect of the right T3-4 neural foramen. There is also abnormal  paravertebral soft tissue laterally and anteriorly as seen on CT.  Central ventral epidural material posterior to the T4 vertebral body  may represent tumor or hematoma. Mild T2 hyperintensity is present  in the spinal cord at the T3 level extending inferiorly to the T4-5  disc space level consistent with mild edema. Mild edema is present  in the posterior paraspinal soft tissues of the upper thoracic  spine. No other vertebral body lesions are identified.  There is a moderate-sized central disc  extrusion at T7-8 which  contacts and mildly flattens the ventral spinal cord with mild  spinal stenosis. A small left central disc protrusion is present at  T8-9 with at most minimal impression on the spinal cord. Massive  enlargement of the left thyroid lobe is partially visualized.  Mediastinal and lung findings are better demonstrated on recent  chest CT.  IMPRESSION:  1. T3 vertebral body mass with pathologic compression fracture and  epidural tumor resulting in moderate cord compression with evidence  of mild spinal cord edema.  2. T7-8 disc extrusion resulting in mild spinal stenosis.   MRI Head / C-Spine 9/23:  CLINICAL DATA: 48 year old female who presented with back pain and  lower extremity numbness, with continued inadequately explained  lower extremity weakness following Hospital discharge on 12/12/2013.  Initial encounter.  EXAM:  MRI HEAD WITHOUT AND WITH CONTRAST  MRI CERVICAL SPINE WITHOUT AND WITH CONTRAST  TECHNIQUE:  Multiplanar, multiecho pulse sequences of the brain and surrounding  structures, and cervical spine, to include the craniocervical  junction and cervicothoracic  junction, were obtained without and  with intravenous contrast.  CONTRAST: 20 mL MultiHance.  COMPARISON: Lumbar MRI 12/10/2013.  FINDINGS:  MRI HEAD FINDINGS  Cerebral volume is normal. No restricted diffusion to suggest acute  infarction. No midline shift, mass effect, evidence of mass lesion,  ventriculomegaly, extra-axial collection or acute intracranial  hemorrhage. Cervicomedullary junction and pituitary are within  normal limits. Major intracranial vascular flow voids are preserved.  Pearline Cables and white matter signal is within normal limits for age  throughout the brain. No abnormal enhancement identified, pulsation  artifact prominent on axial post-contrast images.  Large body habitus. Small right frontal scalp lipoma (series 10,  image 37). Visible internal auditory structures appear normal. Mild  right mastoid fluid. Minor paranasal sinus mucosal thickening.  Visualized orbit soft tissues are within normal limits. Partially  visible thyromegaly greater on the left, see below. Normal bone  marrow signal.  MRI CERVICAL SPINE FINDINGS  Large body habitus resulting in wrap artifact on axial images.  However, there is massive thyroid enlargement evident, with the left  lobe measuring at least 7.2 x 5.2 x 11.2 cm (AP by transverse by  CC). The trachea is displaced to the right at the thoracic inlet and  mildly compressed. There is other regional mass effect including  sheath. On the left carotid  Fairly preserved cervical lordosis. No marrow edema or evidence of  acute osseous abnormality. Cervicomedullary junction is within  normal limits. Spinal cord signal is within normal limits at all  visualized levels. No abnormal intradural enhancement.  C2-C3: Negative.  C3-C4: Disc osteophyte complex, facet and ligament flavum  hypertrophy. Along with a degree of congenital canal narrowing,  there is mild spinal stenosis and moderate biforaminal stenosis.  C4-C5: Mild uncovertebral and  facet hypertrophy. Mild right C5  foraminal stenosis.  C5-C6: Circumferential disc osteophyte complex. Along with a degree  of congenital canal narrowing there is mild spinal stenosis.  Moderate right C6 foraminal stenosis.  C6-C7: Negative except for mild facet hypertrophy.  C7-T1: Negative.  T1-T2: Negative.  Faintly visualized at the T3 level in the upper thoracic spine there  is a lobulated solidly enhancing mass which appears to extend into  the epidural space from the T3 vertebral body. This is inseparable  from the spinal cord which is likely compressed. See series 2000  image 7 and series 1500, image 6. This is not included on axial  images.  IMPRESSION:  1. Faintly visible but aggressive appearing enhancing mass at the T3  level of the upper thoracic spine, likely with spinal cord  compression evaluate further are with thoracic MRI without and with  contrast.  2. Normal for age MRI appearance of the brain.  3. Massive thyroid enlargement, greater on the left. Left thyroid  goiter/mass measuring at least 7.2 x 5.2 x 11.2 cm. Mass effect at  the thoracic inlet including on the trachea.  4. Mild chronic disc and endplate degeneration in the cervical spine  at C3-C4 and C5-C6 resulting in borderline to mild spinal stenosis,  but no cervical spinal cord mass effect or signal abnormality.  Electronically Signed:  By: Lars Pinks M.D.  On: 12/14/2013 09:50   MRI L-Spine 9/19:  CLINICAL DATA: Back pain with bilateral leg numbness, worsening  with oral steroids.  EXAM:  MRI LUMBAR SPINE WITHOUT CONTRAST  TECHNIQUE:  Multiplanar, multisequence MR imaging of the lumbar spine was  performed. No intravenous contrast was administered.  COMPARISON: CT 12/09/2013  FINDINGS:  There is no degenerative disease at L2-3 or above. The discs are  normal in signal characteristics and morphology. The spinal canal  and foramina are widely patent. The distal cord and conus are normal  with  conus tip at L1. There are small benign and insignificant  hemangiomas within the L2 and L3 vertebral bodies.  L3-4: Minimal desiccation of the disc. Mild circumferential bulging  slightly more prominent towards the left. Bilateral facet  degeneration and hypertrophy. Mild narrowing of the lateral recesses  without apparent neural compression. Mild foraminal narrowing on the  left without apparent neural compression.  L4-5: Desiccation of the disc with circumferential protrusion.  Bilateral facet and ligamentous hypertrophy. Stenosis of both  lateral recesses without definite focal neural compression. Mild  foraminal narrowing bilaterally.  L5-S1: Desiccation of the disc with loss of height. Endplate  osteophytes and bulging of the disc more prominent towards the left.  Bilateral facet degeneration and hypertrophy left more than right.  Stenosis of the subarticular lateral recess on the left that could  compress the S1 nerve root. Mild foraminal narrowing left more than  right but without apparent compression of the exiting L5 nerve  roots.  Serpiginous fluid-filled structure in the left pelvis could  represent hydrosalpinx. This is not primarily or completely imaged.  IMPRESSION:  Degenerative disc disease and degenerative facet disease in the  lower lumbar spine that could be a cause of lumbago.  Lateral recess and foraminal narrowing at L3-4, L4-5 and L5-S1 as  described above that could be a cause of focal neural compression.  In general, this is more pronounced on the left than on the right.  In particular, there is potential for left S1 nerve root compression  in the subarticular lateral recess at L5-S1.  Electronically Signed  By: Nelson Chimes M.D.  On: 12/10/2013 08:45   CT L-Spine 9/18:  CLINICAL DATA: Low back pain with difficulty walking.  EXAM:  CT LUMBAR SPINE WITHOUT CONTRAST  TECHNIQUE:  Multidetector CT imaging of the lumbar spine was performed without   intravenous contrast administration. Multiplanar CT image  reconstructions were also generated.  COMPARISON: None.  FINDINGS:  There is no fracture or spondylolisthesis. There is moderately  severe disc space narrowing at L5-S1 with slight vacuum phenomenon  at this level. There is moderate narrowing at L4-5. There are  prominent anterior osteophytes at L2, L3, L4, and L5.  At T12-L1, there is moderate facet hypertrophy bilaterally. There is  no nerve root edema or effacement. No disc extrusion or stenosis.  At L1-2, there is moderate facet hypertrophy bilaterally. There is  mild diffuse disc bulging. There is no nerve root edema or  effacement. No disc extrusion or stenosis.  At L2-3, there is broad-based disc bulging and moderate facet  hypertrophy bilaterally. There is borderline narrowing of the thecal  sac with borderline spinal stenosis. No disc extrusion.  At L3-4, there is severe facet osteoarthritic change bilaterally,  more pronounced on the left than on the right. There is broad-based  disc bulging and moderate ligamentum flavum hypertrophy. These  findings in concert lead to moderate generalized spinal stenosis.  At L4-5, there is broad-based disc protrusion and severe facet  osteoarthritic change bilaterally. These findings in concert lead to  moderate generalized spinal stenosis. No frank disc extrusion.  At L5-S1, there is severe facet osteoarthritic change on the left  and moderate facet osteoarthritic change on the right. There is  broad-based disc protrusion. There is exit foraminal narrowing  bilaterally, more severe on the left than on the right, due to  osteophytic change. No frank disc extrusion or stenosis is seen at  this level.  There are no paraspinous lesions.  IMPRESSION:  Spinal stenosis at L3-4 and L4-5, multifactorial. Borderline spinal  stenosis at L2-3. There does appear to be a degree of congenital  narrowing of the canal which contributes to  spinal stenosis.  Multilevel arthropathy.  No frank disc extrusion.  No fracture or spondylolisthesis.  Electronically Signed  By: Lowella Grip M.D.  On: 12/09/2013 13:50   CXR 9/13:  CLINICAL DATA: Upper back pain for 5 weeks. Dyspnea on exertion.  Nonsmoker. History of hypertension.  EXAM:  CHEST 2 VIEW  COMPARISON: 04/15/2006  FINDINGS:  The heart is mildly enlarged. There is elevation of right  hemidiaphragm, stable in appearance. There is streaky density  favored to be within the right lower lobe, consistent with  atelectasis or infiltrate. No pulmonary edema. No pleural effusions.  There are moderate degenerative changes in the thoracic spine.  IMPRESSION:  Right lower lobe atelectasis or infiltrate.  Electronically Signed  By: Shon Hale M.D.  On: 12/04/2013 19:26  Venous Duplex 9/24:  Summary:  - No evidence of deep vein thrombosis involving the right lower extremity. - No evidence of deep vein thrombosis involving the left lower extremity.   Surgical Pathology Report: 12/21/2013 Diagnosis Soft tissue mass, simple excision, Thoracic Epidural Tumor - METASTATIC CARCINOMA. PLEASE SEE COMMENT. Microscopic Comment There are clusters of malignant cells with abundant eosinophilic cytoplasm. A battery of immunostains were performed and the tumor cells show the following profile: Cytokeratin 7 - strongly positive Cytokeratin 8/18 - strongly positive TTF-1 - strongly positive. Thyroglobulin - negative CD10 - negative Cytokeratin 20 - negative S100 - negative Vimentin - negative The control stained appropriately. The overall findings are diagnostic for metastatic carcinoma. 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 However, a metastatic oncocytic thyroid carcinoma is still in the differential. Clinical and radiographic correlation is highly recommended.  (HCL:kh 12-19-13) Aldona Bar MD Pathologist, Electronic Signature (Case signed 12/20/2013)  Olam Idler, MD 01/07/2014, 8:44 AM PGY-2 [facility] pager: [phone], text pages welcome

## 2014-01-08 LAB — CBC
HCT: 25.4 % — ABNORMAL LOW (ref 36.0–46.0)
Hemoglobin: 8.6 g/dL — ABNORMAL LOW (ref 12.0–15.0)
MCH: 28.6 pg (ref 26.0–34.0)
MCHC: 33.9 g/dL (ref 30.0–36.0)
MCV: 84.4 fL (ref 78.0–100.0)
PLATELETS: 93 10*3/uL — AB (ref 150–400)
RBC: 3.01 MIL/uL — ABNORMAL LOW (ref 3.87–5.11)
RDW: 14.7 % (ref 11.5–15.5)
WBC: 4.1 10*3/uL (ref 4.0–10.5)

## 2014-01-08 LAB — GLUCOSE, CAPILLARY
GLUCOSE-CAPILLARY: 137 mg/dL — AB (ref 70–99)
GLUCOSE-CAPILLARY: 217 mg/dL — AB (ref 70–99)
Glucose-Capillary: 159 mg/dL — ABNORMAL HIGH (ref 70–99)
Glucose-Capillary: 227 mg/dL — ABNORMAL HIGH (ref 70–99)

## 2014-01-08 LAB — HEPARIN LEVEL (UNFRACTIONATED): Heparin Unfractionated: 0.57 IU/mL (ref 0.30–0.70)

## 2014-01-08 MED ORDER — HEPARIN (PORCINE) IN NACL 100-0.45 UNIT/ML-% IJ SOLN: 2500.0000 [IU]/h | INTRAMUSCULAR | Status: AC

## 2014-01-08 MED ORDER — DEXTROSE 5 % IV SOLN
3.0000 g | INTRAVENOUS | Status: DC
  Filled 2014-01-08: qty 3000

## 2014-01-08 NOTE — Progress Notes (Signed)
Overall stable. No new problems or complaints.  Afebrile. Vital stable. Dressing dry. Neurological exam unchanged.  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 Continue current efforts. Awaiting thyroidectomy.

## 2014-01-08 NOTE — Progress Notes (Signed)
ANTICOAGULATION CONSULT NOTE - Follow Up Consult  Pharmacy Consult for Heparin Indication: pulmonary embolus  Allergies  Allergen Reactions  . Tramadol Nausea And Vomiting    Patient Measurements: Height: 5\' 11"  (180.3 cm) Weight: 313 lb 15 oz (142.4 kg) IBW/kg (Calculated) : 70.8 Heparin Dosing Weight: 105kg  Vital Signs: Temp: 98.4 F (36.9 C) (10/18 1002) Temp Source: Oral (10/18 1002) BP: 124/66 mmHg (10/18 1002) Pulse Rate: 99 (10/18 1002)  Labs:  Recent Labs  01/06/14 0525 01/06/14 1330 01/07/14 0550 01/07/14 1304 01/08/14 0640  HGB 8.9*  --  8.6*  --  8.6*  HCT 25.9*  --  25.5*  --  25.4*  PLT 128*  --  111*  --  93*  APTT  --   --   --  163*  --   LABPROT  --   --   --  14.1  --   INR  --   --   --  1.08  --   HEPARINUNFRC 0.27* 0.44 0.56  --  0.57  CREATININE  --   --   --  0.67  --     Estimated Creatinine Clearance: 136.4 ml/min (by C-G formula based on Cr of 0.67).   Medications:  Heparin @ 2500 units/hr  Assessment: 12 YOF who continues on heprin for RLL PE while awaiting thyroidectomy scheduled for 10/19. Heparin level remain therapeutic this AM (HL 0.57, goal of 0.3-0.7). Hgb low but stable - no overt s/sx of bleeding noted.   Platelet count is steadily declining.  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  Goal of Therapy:  Heparin level 0.3-0.7 units/ml Monitor platelets by anticoagulation protocol: Yes   Plan:  1. Continue heparin at current rate of units/hr (25 ml/hr) 2. Heparin to be turned off at midnight tonight for thyroidectomy in the morning. 3. F/u after procedure for resumption of anticoagulation, could consider argatroban at that time.   Uvaldo Rising, BCPS  Clinical Pharmacist Pager [phone]  01/08/2014 2:09 PM

## 2014-01-08 NOTE — Discharge Summary (Signed)
[name] Discharge Summary  Patient name: Sarah [name] Medical record number: [id number] Date of birth: July 12, 1965 Age: 48 y.o. 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 12 staples in place.  Cardio: distant heart sounds due to habitus  Lungs: Normal effort, CTA bilateral anteriorly.  Abdomen: Soft and nontender, nondistended, audible bowel sounds noted.  Extremities: no edema, No calf tenderness. SCDs in place.  Neuro: cranial nerves grossly normal. Normal speech.    Brief [name] Course:  Sarah [name] is a 48 y.o. female who presented on 12/13/13 with recurrence of lower extremity weakness after discharge from the [name] on 12/12/13. She states that she was discharged, and upon arriving at home she said that she was unable to get her legs out of the car on her own. Upon attempting to stand she said that she was unable to, and she felt "shooting pains" down her legs. At admission patient's symptoms included numbness from the level of her upper abdomen to her feet, including the perineum/genitalia. She stated that she has been this "numb" since 9/17, at which time her feet, legs, and groin area were "tingling". She presented to the ED on 9/18, and according to the patient she was given pain medication for her symptoms and discharged home. She reported having her legs "give out" on her  on 9/19. She was seen by neurology at that time who thought this was unlikely a myelopathy and discouraged further imaging or workup. During her [name] stay she subsequently improved without intervention, and with PT/OT. At admission she did not endorse any change in her symptoms since discharge on 9/21. She denied loss of bowel or bladder function, trauma or new heavy lifting. Back pain was still primarily located in her mid-upper back.   On 12/14/13 patient had a MR of her brain, cervical spine, and thoracic spine as well as a CT of her chest and abdomen/pelvis. This ultimately showed that patient had a mass located at the level her T3 vertebra with significant spinal compression visualized. Neurosurgery was informed and performed an emergent decompression and biopsy the night of 9/23 and into the morning of 9/24. Patient was placed on Decadron following surgery; this was gradually tapered down and discontinued on 10/9. Oncology was also consulted after this emergent decompression and biopsy. CT chest also showed evidence of PE, patient was placed on heparin drip.  Patient experienced very little improvement in lower extremity weakness and numbness. Light PT was started. Over time she gradually began to develop some slight feeling in her lower extremities but without improvement of her motor movement. Initial pathology from her T3 lesion came back on 9/30 with evidence of metastatic adenocarcinoma suspected origin from lung do to nodule seen on chest CT. (Of note: [name] a never smoker). Patient was noted to have significant thyromegaly during her stay. Radiology performed today thyroid ultrasound on 10/1 which showed heterogenicity throughout, this could not rule out malignancy. A biopsy of the thyroid was obtained by interventional radiology on 10/2.  The results of this biopsy were obtained on 10/7 which showed neoplastic cells with granular/oncocytic cytoplasm and prominent nucleoli. This was  suggestive of Hurthle cell carcinoma. Making the primary neoplasm most likely thyroid in origin.  Patient underwent spinal stabilization on 10/8. This was a scheduled procedure which had been anticipated by Dr. Christella [name] since her initial decompression. Patient tolerated this procedure very well.   Patient was then seen by Dr. Constance [name] of ENT. Dr. Constance [name] recommended thyroidectomy and promptly put her on his schedule to be done 10/19. Complete thyroidectomy was performed. Because of the extensiveness of the surgery patient was placed in the surgical ICU for observation. Patient was intubated for an additional 24 hours and was later extubated on 10/21. Patient return back to our care from the care of [facility] on 10/22. He was noted that patient had developed another UTI around the time of her surgery. Culture showed that this was resistant to Cipro, patient was placed on Keflex. Patient recovered very well from her thyroidectomy.  Radiation oncology was consulted on 10/26. Per their orders a CT simulation was performed on 10/27 at [name]. After discussion with them it appears that they will begin treatments proximally 2 weeks after patient's thyroidectomy.  Prior to patient's thyroidectomy it was noted that she began experiencing significant thrombocytopenia. He was suspected that this could be secondary to her heparin therapy. HIT panels were obtained. Patient was transitioned off of heparin drip and onto Argatroban. This was continued until HIT panels returned. When they returned, Heparin-induced platelet antibody was noted to be weakly positive. However SRA, which is the gold standard, was found to be negative. Because of this pharmacy was consulted and it was agreed that it was safe to place patient on Lovenox therapy, with the understanding that patient's platelet levels would be continuously monitored during her stay.  Patient experienced some abdominal discomfort on 10/28. Patient was noted to have  some constipation w/ mild abdominal distention. DRE was performed, some external hemorrhoids were noted. No other abnormalities appreciated. Patient received an enema. Patient stooled twice and reported significant improvement.   Consultation to inpatient rehabilitation was obtained. They saw patient and deemed her appropriate for admission under their services. At which time patient was stable for discharge and an order was placed to have patient discharged into inpatient rehabilitation.   Issues for Follow Up:  - Synthroid coverage - Newly paraplegic - home care - Cancer management  Significant Procedures: Emergent spinal decompression and biopsy of T3 secondary to pathologic fracture/mass Thyroid biopsy Spinal stabilization Thyroidectomy  Significant Labs and Imaging:   Recent Labs Lab 01/16/14 0547 01/18/14 0805 01/19/14 0007  WBC 5.0 6.6 5.8  HGB 8.7* 8.9* 9.2*  HCT 25.7* 26.7* 27.7*  PLT 331 340 329    Recent Labs Lab 01/13/14 0855 01/14/14 0501 01/19/14 0510  NA 142 141 138  K 3.7 3.3* 3.4*  CL 101 101 98  CO2 28 28 27   GLUCOSE 109* 110* 91  BUN 9 7 10   CREATININE 0.69 0.76 0.66  CALCIUM 8.0* 7.8* 8.4  ALKPHOS  --  140*  --   AST  --  18  --   ALT  --  18  --   ALBUMIN  --  2.2*  --      Results/Tests Pending at Time of Discharge: none  Discharge Medications:    Medication List    STOP taking these medications       HYDROcodone-acetaminophen 5-325 MG per tablet  Commonly  known as:  NORCO/VICODIN      TAKE these medications       acetaminophen 325 MG tablet  Commonly known as:  TYLENOL  Take 2 tablets (650 mg total) by mouth every 4 (four) hours as needed (temp > 100.5, pain).     acetaminophen 650 MG suppository  Commonly known as:  TYLENOL  Place 1 suppository (650 mg total) rectally every 4 (four) hours as needed (temp > 100.5).     albuterol 108 (90 BASE) MCG/ACT inhaler  Commonly known as:  PROVENTIL HFA;VENTOLIN HFA  Inhale 2 puffs  into the lungs every 6 (six) hours as needed for wheezing or shortness of breath.     cephALEXin 500 MG capsule  Commonly known as:  KEFLEX  Take 1 capsule (500 mg total) by mouth 4 (four) times daily.     levothyroxine 125 MCG tablet  Commonly known as:  SYNTHROID, LEVOTHROID  Take 2 tablets (250 mcg total) by mouth daily before breakfast.     LORazepam 0.5 MG tablet  Commonly known as:  ATIVAN  Take 1 tablet (0.5 mg total) by mouth every 6 (six) hours as needed for anxiety.     sodium chloride 0.9 % infusion  Inject 10 mLs into the vein continuous.      ASK your doctor about these medications       cyclobenzaprine 5 MG tablet  Commonly known as:  FLEXERIL  Take 1 tablet (5 mg total) by mouth 3 (three) times daily as needed for muscle spasms.     diazepam 5 MG tablet  Commonly known as:  VALIUM  Take 5 mg by mouth 3 (three) times daily as needed for muscle spasms (pain).     diclofenac 75 MG EC tablet  Commonly known as:  VOLTAREN  Take 1 tablet (75 mg total) by mouth 2 (two) times daily.     ibuprofen 200 MG tablet  Commonly known as:  ADVIL,MOTRIN  Take 600 mg by mouth every 6 (six) hours as needed for mild pain.     losartan 25 MG tablet  Commonly known as:  COZAAR  Take 0.5 tablets (12.5 mg total) by mouth daily.        Discharge Instructions: Please refer to Patient Instructions section of EMR for full details.  Patient was counseled important signs and symptoms that should prompt return to medical care, changes in medications, dietary instructions, activity restrictions, and follow up appointments.   Follow-Up Appointments: Follow-up Information   Follow up with Angelica Chessman, MD On 12/19/2013. (2 pm  for [name] follow up )    Specialty:  Internal Medicine   Contact information:   [place] [phone]       Elberta Leatherwood, MD 01/19/2014, 12:39 PM PGY-1, [place]

## 2014-01-08 NOTE — Progress Notes (Signed)
Family Medicine Teaching Service Daily Progress Note Intern Pager: [phone]  Patient name: Sarah [name] Medical record number: [id number] Date of birth: 1965/11/20 Age: 48 y.o. Gender: female  Primary Care Provider: No PCP Per Patient Consultants: Neurosurgery, IR, Oncology, Endocrinology Code Status: Full  Pt Overview and Major Events to Date:  - 9/22: Pt. Admitted with lower extremity weakness.  - 9/23: Pt. Found to have mass at T-3 level with spinal compression.  - 9/24: Surgery performed for decompression and biopsy overnight. Pt. Post-op and doing well. 3cm Suprahilar mass on CT Chest, PE also identified. ?Thyroid etiology, but much less likely for mets. Oncology consulted.  - 9/25 - 9/29: Awaiting pathology results with plan for oncology to see once path returns. Pt. With little neurological improvement. Started with light PT.  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 Oncology on board. Neurosurgery to comment on need for spinal stabilization surgery.  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 - 10/8: Underwent spinal stabilization surg. - 10/10: Restarted heparin  - 10/13: Dr. GerkiHarlow Asale to perform surg as inpatient; will seek other consultations - 10/14: Dr. RosenConstance HolsterNT consulted. He will see pt on 10/15. - 10/16: ENT to perform Thyroidectomy Monday 10/19 - 10/17: All cell lines (WBC, Hgb, Plts) continue to drop: Checking Labs as below - 10/18: Midnight tonight, holding heparin in preparation for surgery tomorrow  Assessment and Plan: Sarah [name] 48 y.89 female presenting with continued lower extremity weakness at baseline after discharge from hospital on 9/21. PMH is significant  for obesity, HTN, and ?History of Hypothyroidism previously on Synthroid per patient.   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  Neurosurgery took her to the OR on 9/23, and biopsied as well as performed laminectomy for decompression. Surgical pathology with metastatic adenocarcinoma. Likely poor prognosis. At time of decompression, was thought to be most likely from lung mets. - Pain control:  Oxy IR prn. IV Morphine for breakthrough - Neurosurgery following: Patient was taken for spinal stabilization 10/9.  - Oncology following (Dr. MagriJana Hakimpatient; Dr. MohamJulien Nordmanntpatient); Neurosurgery (Dr. CabbeChristella Noadocrinology (Dr. Kerr)Buddy DutyT (Dr. 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  - Large thyroid goiter noted. TSH normal; ultrasound on 10/1, Biopsy on 10/2 - FNA results: "clusters of neoplastic cells w/ granular/oncogenic cytoplasm--similar to previous bx" - Dr. RosenConstance Holsterseen patient. Greatly appreciate the input. - Thyroidectomy scheduled 10/19; IV [facility] @ 2359. Reviewed order and discussed with pharmacy to double check.  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  - PT recommendations CIR vs SNF with 24hour supervision. Will plan dispo based on goals of care discussion. - Per SW; SNF is an option, but will need estimated time of DC; pt currently would rather go home than to SNF but is willing to consider other options. - Foley catheter in place for urinary retention; Stooling regularly, no sxs of fecal retention.  - Discussed Recs for PT with Neurosurgery.  Light PT at this point. Pt. Not to get UOB.  - TED hose  #Pulmonary Embolus - Found on CT Chest  Pt. With brief episodes of tachycardia, but otherwise  asymptomatic with O2 sats 100% on 2L [place] >> now on Room Air.  Blood pressures stable. Lower extremity Venous Duplex negative for DVT.  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 ESR 59 (9/22)  - Now on IV heparin -- Will begin bridging as soon as possible (post surgical); waiting until thyroidectomy.  - Per pharm--recommendation for long-term Lovenox; Difficult due to self-pay, CM is working on solution. If it is possible, Dose would be 15m SQ q12h - Will continue to monitor VS. Stable at this time.  # Anemia and Thrombocytopenia: Hgb and Plts have been trending down slowly since spinal stabilization surgery on 10/7 (Hgb 12.3) Hgb currently stable @  8.6 on 10/18. WBC stable at 4.1. Plts continue down trending. retic production index = 0.6 indicating inadequate bone marrow response. 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 No signs of end organ damage so low suspicion for TTP, DIC - FOBT pending; nursing staff informed - Continue to follow CBC - Check: PT (nl), PTT, blood smear (polychromasia), CMET (ALP 398 - high), LDH (262 - high), Haptoglobin - consider HIT panel and d/cing Heparin - re-evaluate after procedure as heparin being held tonight anyway. - Will consider Iron, TIBC, B12, and folate if Hgb downtrend continues  #Hyperglycemia -  Likely diabetic with PCOS given A1C of 6.7. CBG's 180's - 200s.  - Patient informed of elevated A1C and likely diagnosis of DMII. She agrees with uKoreathat this is a marginal issue at this point, and we will recommend treatment based upon our goals of care discussion.  - Continue to monitor.  - Resistant SSI; 15U Lantus - Holding tomorrow morning's lantus dose but continuing resistant SSI and HS scale. Reassess after surgery.  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 PCOS vs. Adrenal patholog.  Hypertensive and not hypotensive. Testing during this hospitalization after having recently received steroids would be largely non-diagnostic. Though, could also be a combination of isolated processes i.e. Clitoromegaly, Hirsutism 2/2 obesity. - Will leave for Outpatient f/u -- [place] assess ovaries vs 21-hydroxylase/ACTH/cortisol.  - TSH, Cortisol WNL - A1C : 6.7 - consistent with PCOS given evidence of cystic structures in pelvis.   # UTI, 2/2 foley catheter - UCx = Proteus - Resolved - dysuria and suprapubic pain noted PM 10/1. - Cipro 2540mBID (10/2 started) -- treated for 7 days -- DC 10/9.  #HTN- BP well controlled -continue losartan.   #Obesity-  - consistent with potential PCOS.   FEN/GI: Reg diet; KVO Prophylaxis: on full dose IV heparin (until held tonight for thyroidectomy tomorrow), SCDs  Disposition: Pending Goals of Care discussion and endocrinology recommendations. Possible discharge early next week.  Subjective: Still doing well, we briefly discuss thyroidectomy which she is looking forward to.  Objective: Temp:  [98.4 F (36.9 C)-98.8 F (37.1 C)] 98.4 F (36.9 C) (10/18 1002) Pulse Rate:  [94-102] 99 (10/18 1002) Resp:  [16] 16 (10/18 1002) BP: (113-124)/(62-75) 124/66 mmHg (10/18 1002) SpO2:  [98 %-100 %] 99 % (10/18 1002) Physical Exam: General: NAD, AAOx3, morbidly obese  HEENT: AT/[place],  neck supple, J-line in place Cardio: distant heart sounds due to habitus Lungs: Normal  effort, CTA bilateral anteriorly. Abdomen: Soft and nontender, nondistended, No organomegaly noted.  Extremities: no edema,  No calf tenderness, feels a "tickle" sensation Skin: No lesions or rashes noted Neuro: cranial nerves grossly normal. Normal speech;   Laboratory:  Recent Labs Lab 01/06/14 0525 01/07/14 0550 01/08/14 0640  WBC 5.3 4.0 4.1  HGB 8.9* 8.6* 8.6*  HCT 25.9* 25.5* 25.4*  PLT 128* 111* 93*    Recent Labs Lab 01/05/14 0715 01/07/14 1304  NA 131* 134*   K 3.8 3.8  CL 95* 94*  CO2 26 29  BUN 9 7  CREATININE 0.71 0.67  CALCIUM 8.0* 8.3*  PROT 5.1* 5.6*  BILITOT 1.0 0.7  ALKPHOS 273* 398*  ALT 38* 54*  AST 32 38*  GLUCOSE 150* 196*   Urinalysis    Component Value Date/Time   COLORURINE AMBER* 12/23/2013 1058   APPEARANCEUR TURBID* 12/23/2013 1058   LABSPEC 1.018 12/23/2013 1058   PHURINE 7.5 12/23/2013 1058   GLUCOSEU NEGATIVE 12/23/2013 1058   HGBUR LARGE* 12/23/2013 1058   BILIRUBINUR NEGATIVE 12/23/2013 1058   KETONESUR NEGATIVE 12/23/2013 1058   PROTEINUR 100* 12/23/2013 1058   UROBILINOGEN 0.2 12/23/2013 1058   NITRITE NEGATIVE 12/23/2013 1058   LEUKOCYTESUR LARGE* 12/23/2013 1058    Phos - 3.9 Mg - 2.2 CRP - 11.6 ESR - 59 CK - 56 B12 - 209 Random Cortisol - 25.4 A1C - 6.7 TSH - 2.13 RPR - nonreactive HIV - nonreactive UPT - Negative  Imaging/Diagnostic Tests:  CT Chest Abd/ Pelvis 9/23:  CLINICAL DATA: Destructive metastatic lesion of the T3 vertebral  body with no known primary carcinoma.  EXAM:  CT CHEST, ABDOMEN, AND PELVIS WITH CONTRAST  TECHNIQUE:  Multidetector CT imaging of the chest, abdomen and pelvis was  performed following the standard protocol during bolus  administration of intravenous contrast.  CONTRAST: 15m OMNIPAQUE IOHEXOL 300 MG/ML SOLN  COMPARISON: MRI of the cervical spine earlier today.  FINDINGS:  CT CHEST FINDINGS  Within the chest, a right suprahilar mass measures approximately 3.0  x 2.7 x 2.2 cm. This either represents a nodal metastasis or central  lung mass such as small cell carcinoma. A number of smaller  mediastinal lymph nodes are also identified in the right  paratracheal region, prevascular space and AP window.  Destructive lesion of the T3 vertebral body noted which nearly  replaces the entire vertebral body with tumor extension posteriorly  into the epidural space causing significant compression of the  spinal cord. Paravertebral extension of tumor is also suspected   lateral and anterior to the vertebral body, right greater than left.  Massive enlargement of the thyroid gland present with transverse  dimensions of approximately 7.7 x 8.5 cm. The thyroid gland is  incompletely visualized as the neck was not included. Central  dystrophic calcification identified. The enlarged thyroid gland does  cause significant mass effect on the airway with evidence of  tracheal deviation to the right and tracheal narrowing at the  thoracic inlet. The enlarged thyroid gland extends into the superior  mediastinum.  There is evidence of pulmonary embolism with nonocclusive thrombus  identified in the right middle lobe and lower lobe pulmonary  arteries. Pulmonary arterial opacification is not optimal on the  standard venous phase evaluation of the chest but the right sided  pulmonary embolism is clearly present.  There are small bilateral pulmonary nodules. Right-sided pulmonary  nodule measuring 6 mm likely localizes to the very top of the lower  lobe near the  major fissure. Left upper lobe nodule measures 7 mm.  Lingular nodule measures 7 mm. Left lower lobe nodule measures 7 mm.  Faint peripheral right middle lobe nodule measures 3 mm. Inferior  right middle lobe nodule measures 6 mm. Several small nodules in the  posterior right upper lobe identified in the 3-4 mm range.  No evidence of pleural or pericardial fluid. No other bony lesions  identified in the thorax.  CT ABDOMEN AND PELVIS FINDINGS  Small low-density lesion in the left lobe of the liver measures 7 mm  and likely represents a cyst. No other hepatic abnormalities. The  gallbladder, pancreas, spleen, adrenal glands and kidneys are  unremarkable. No adenopathy is identified in the abdomen or pelvis.  Bowel shows normal caliber without evidence of obstruction, lesion  or inflammation. No abnormal fluid collections.  The bladder is moderately distended. Elongated cystic changes in  both adnexal regions  identified which may represent a combination of  ovarian cysts and potentially hydrosalpinx. Correlation with pelvic  ultrasound is recommended. No hernias are identified. The uterus  appears unremarkable by CT. No bony lesions are identified in the  abdomen or pelvis.  IMPRESSION:  1. 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  2. 3 cm right suprahilar mass may represent nodal metastasis versus  primary central tumor. Next number multiple small bilateral  pulmonary nodules consistent with metastatic disease.  3. Acute nonocclusive pulmonary embolism in the right middle lobe  and right lower lobe pulmonary arteries.  4. Massive thyroid goiter causing tracheal deviation to the right  and narrowing of the trachea.  5. Tubular cystic abnormalities of both adnexal regions which may be  a combination of cystic changes in the ovaries and potentially  hydrosalpinx. Correlation recommended with pelvic ultrasound to  exclude any suspicious cystic lesions of the adnexae.  6. Critical Value/emergent results were called by telephone at the  time of interpretation on 12/14/2013 at 4:52 pm to Dr. Tamala Julian of the  [facility] Teaching Service, who verbally acknowledged these  results.  Electronically Signed  By: Aletta Edouard M.D.  On: 12/14/2013 16:53   MRI T-spine 9/23:  CLINICAL DATA: Lower extremity weakness. Worsening back pain.  Compressive upper thoracic mass partially visualized on cervical  spine MRI as well as chest CT with spinal cord compression.  EXAM:  MRI THORACIC SPINE WITHOUT CONTRAST  TECHNIQUE:  Multiplanar, multisequence MR imaging of the thoracic spine was  performed. No intravenous contrast was administered.  COMPARISON: Cervical spine MRI and chest CT earlier today  FINDINGS:  There is a T3 compression fracture with approximately 50% height   loss centrally. As described on cervical spine MRI and chest CT  earlier today, there is abnormal soft tissue replacing the majority  of the T3 vertebral body extending into the pedicles as well as into  the ventral epidural space with moderate cord compression. Epidural  extension is slightly greater on the right and extends into the  inferior aspect of the right T2-3 neural foramen as well as superior  aspect of the right T3-4 neural foramen. There is also abnormal  paravertebral soft tissue laterally and anteriorly as seen on CT.  Central ventral epidural material posterior to the T4 vertebral body  may represent tumor or hematoma. Mild T2 hyperintensity is present  in the spinal cord at the T3 level extending inferiorly to the T4-5  disc space  level consistent with mild edema. Mild edema is present  in the posterior paraspinal soft tissues of the upper thoracic  spine. No other vertebral body lesions are identified.  There is a moderate-sized central disc extrusion at T7-8 which  contacts and mildly flattens the ventral spinal cord with mild  spinal stenosis. A small left central disc protrusion is present at  T8-9 with at most minimal impression on the spinal cord. Massive  enlargement of the left thyroid lobe is partially visualized.  Mediastinal and lung findings are better demonstrated on recent  chest CT.  IMPRESSION:  1. T3 vertebral body mass with pathologic compression fracture and  epidural tumor resulting in moderate cord compression with evidence  of mild spinal cord edema.  2. T7-8 disc extrusion resulting in mild spinal stenosis.   MRI Head / C-Spine 9/23:  CLINICAL DATA: 48 year old female who presented with back pain and  lower extremity numbness, with continued inadequately explained  lower extremity weakness following Hospital discharge on 12/12/2013.  Initial encounter.  EXAM:  MRI HEAD WITHOUT AND WITH CONTRAST  MRI CERVICAL SPINE WITHOUT AND WITH CONTRAST   TECHNIQUE:  Multiplanar, multiecho pulse sequences of the brain and surrounding  structures, and cervical spine, to include the craniocervical  junction and cervicothoracic junction, were obtained without and  with intravenous contrast.  CONTRAST: 20 mL MultiHance.  COMPARISON: Lumbar MRI 12/10/2013.  FINDINGS:  MRI HEAD FINDINGS  Cerebral volume is normal. No restricted diffusion to suggest acute  infarction. No midline shift, mass effect, evidence of mass lesion,  ventriculomegaly, extra-axial collection or acute intracranial  hemorrhage. Cervicomedullary junction and pituitary are within  normal limits. Major intracranial vascular flow voids are preserved.  Pearline Cables and white matter signal is within normal limits for age  throughout the brain. No abnormal enhancement identified, pulsation  artifact prominent on axial post-contrast images.  Large body habitus. Small right frontal scalp lipoma (series 10,  image 37). Visible internal auditory structures appear normal. Mild  right mastoid fluid. Minor paranasal sinus mucosal thickening.  Visualized orbit soft tissues are within normal limits. Partially  visible thyromegaly greater on the left, see below. Normal bone  marrow signal.  MRI CERVICAL SPINE FINDINGS  Large body habitus resulting in wrap artifact on axial images.  However, there is massive thyroid enlargement evident, with the left  lobe measuring at least 7.2 x 5.2 x 11.2 cm (AP by transverse by  CC). The trachea is displaced to the right at the thoracic inlet and  mildly compressed. There is other regional mass effect including  sheath. On the left carotid  Fairly preserved cervical lordosis. No marrow edema or evidence of  acute osseous abnormality. Cervicomedullary junction is within  normal limits. Spinal cord signal is within normal limits at all  visualized levels. No abnormal intradural enhancement.  C2-C3: Negative.  C3-C4: Disc osteophyte complex, facet and  ligament flavum  hypertrophy. Along with a degree of congenital canal narrowing,  there is mild spinal stenosis and moderate biforaminal stenosis.  C4-C5: Mild uncovertebral and facet hypertrophy. Mild right C5  foraminal stenosis.  C5-C6: Circumferential disc osteophyte complex. Along with a degree  of congenital canal narrowing there is mild spinal stenosis.  Moderate right C6 foraminal stenosis.  C6-C7: Negative except for mild facet hypertrophy.  C7-T1: Negative.  T1-T2: Negative.  Faintly visualized at the T3 level in the upper thoracic spine there  is a lobulated solidly enhancing mass which appears to extend into  the epidural space from the T3  vertebral body. This is inseparable  from the spinal cord which is likely compressed. See series 2000  image 7 and series 1500, image 6. This is not included on axial  images.  IMPRESSION:  1. Faintly visible but aggressive appearing enhancing mass at the T3  level of the upper thoracic spine, likely with spinal cord  compression evaluate further are with thoracic MRI without and with  contrast.  2. Normal for age MRI appearance of the brain.  3. Massive thyroid enlargement, greater on the left. Left thyroid  goiter/mass measuring at least 7.2 x 5.2 x 11.2 cm. Mass effect at  the thoracic inlet including on the trachea.  4. Mild chronic disc and endplate degeneration in the cervical spine  at C3-C4 and C5-C6 resulting in borderline to mild spinal stenosis,  but no cervical spinal cord mass effect or signal abnormality.  Electronically Signed:  By: Lars Pinks M.D.  On: 12/14/2013 09:50   MRI L-Spine 9/19:  CLINICAL DATA: Back pain with bilateral leg numbness, worsening  with oral steroids.  EXAM:  MRI LUMBAR SPINE WITHOUT CONTRAST  TECHNIQUE:  Multiplanar, multisequence MR imaging of the lumbar spine was  performed. No intravenous contrast was administered.  COMPARISON: CT 12/09/2013  FINDINGS:  There is no degenerative disease  at L2-3 or above. The discs are  normal in signal characteristics and morphology. The spinal canal  and foramina are widely patent. The distal cord and conus are normal  with conus tip at L1. There are small benign and insignificant  hemangiomas within the L2 and L3 vertebral bodies.  L3-4: Minimal desiccation of the disc. Mild circumferential bulging  slightly more prominent towards the left. Bilateral facet  degeneration and hypertrophy. Mild narrowing of the lateral recesses  without apparent neural compression. Mild foraminal narrowing on the  left without apparent neural compression.  L4-5: Desiccation of the disc with circumferential protrusion.  Bilateral facet and ligamentous hypertrophy. Stenosis of both  lateral recesses without definite focal neural compression. Mild  foraminal narrowing bilaterally.  L5-S1: Desiccation of the disc with loss of height. Endplate  osteophytes and bulging of the disc more prominent towards the left.  Bilateral facet degeneration and hypertrophy left more than right.  Stenosis of the subarticular lateral recess on the left that could  compress the S1 nerve root. Mild foraminal narrowing left more than  right but without apparent compression of the exiting L5 nerve  roots.  Serpiginous fluid-filled structure in the left pelvis could  represent hydrosalpinx. This is not primarily or completely imaged.  IMPRESSION:  Degenerative disc disease and degenerative facet disease in the  lower lumbar spine that could be a cause of lumbago.  Lateral recess and foraminal narrowing at L3-4, L4-5 and L5-S1 as  described above that could be a cause of focal neural compression.  In general, this is more pronounced on the left than on the right.  In particular, there is potential for left S1 nerve root compression  in the subarticular lateral recess at L5-S1.  Electronically Signed  By: Nelson Chimes M.D.  On: 12/10/2013 08:45   CT L-Spine 9/18:  CLINICAL  DATA: Low back pain with difficulty walking.  EXAM:  CT LUMBAR SPINE WITHOUT CONTRAST  TECHNIQUE:  Multidetector CT imaging of the lumbar spine was performed without  intravenous contrast administration. Multiplanar CT image  reconstructions were also generated.  COMPARISON: None.  FINDINGS:  There is no fracture or spondylolisthesis. There is moderately  severe disc space narrowing at L5-S1 with  slight vacuum phenomenon  at this level. There is moderate narrowing at L4-5. There are  prominent anterior osteophytes at L2, L3, L4, and L5.  At T12-L1, there is moderate facet hypertrophy bilaterally. There is  no nerve root edema or effacement. No disc extrusion or stenosis.  At L1-2, there is moderate facet hypertrophy bilaterally. There is  mild diffuse disc bulging. There is no nerve root edema or  effacement. No disc extrusion or stenosis.  At L2-3, there is broad-based disc bulging and moderate facet  hypertrophy bilaterally. There is borderline narrowing of the thecal  sac with borderline spinal stenosis. No disc extrusion.  At L3-4, there is severe facet osteoarthritic change bilaterally,  more pronounced on the left than on the right. There is broad-based  disc bulging and moderate ligamentum flavum hypertrophy. These  findings in concert lead to moderate generalized spinal stenosis.  At L4-5, there is broad-based disc protrusion and severe facet  osteoarthritic change bilaterally. These findings in concert lead to  moderate generalized spinal stenosis. No frank disc extrusion.  At L5-S1, there is severe facet osteoarthritic change on the left  and moderate facet osteoarthritic change on the right. There is  broad-based disc protrusion. There is exit foraminal narrowing  bilaterally, more severe on the left than on the right, due to  osteophytic change. No frank disc extrusion or stenosis is seen at  this level.  There are no paraspinous lesions.  IMPRESSION:  Spinal stenosis  at L3-4 and L4-5, multifactorial. Borderline spinal  stenosis at L2-3. There does appear to be a degree of congenital  narrowing of the canal which contributes to spinal stenosis.  Multilevel arthropathy.  No frank disc extrusion.  No fracture or spondylolisthesis.  Electronically Signed  By: Lowella Grip M.D.  On: 12/09/2013 13:50   CXR 9/13:  CLINICAL DATA: Upper back pain for 5 weeks. Dyspnea on exertion.  Nonsmoker. History of hypertension.  EXAM:  CHEST 2 VIEW  COMPARISON: 04/15/2006  FINDINGS:  The heart is mildly enlarged. There is elevation of right  hemidiaphragm, stable in appearance. There is streaky density  favored to be within the right lower lobe, consistent with  atelectasis or infiltrate. No pulmonary edema. No pleural effusions.  There are moderate degenerative changes in the thoracic spine.  IMPRESSION:  Right lower lobe atelectasis or infiltrate.  Electronically Signed  By: Shon Hale M.D.  On: 12/04/2013 19:26  Venous Duplex 9/24:  Summary:  - No evidence of deep vein thrombosis involving the right lower extremity. - No evidence of deep vein thrombosis involving the left lower extremity.   Surgical Pathology Report: 12/21/2013 Diagnosis Soft tissue mass, simple excision, Thoracic Epidural Tumor - METASTATIC CARCINOMA. PLEASE SEE COMMENT. Microscopic Comment There are clusters of malignant cells with abundant eosinophilic cytoplasm. A battery of immunostains were performed and the tumor cells show the following profile: Cytokeratin 7 - strongly positive Cytokeratin 8/18 - strongly positive TTF-1 - strongly positive. Thyroglobulin - negative CD10 - negative Cytokeratin 20 - negative S100 - negative Vimentin - negative The control stained appropriately. The overall findings are diagnostic for metastatic carcinoma. Given the positivity of tumor cells for TTF-1 and negativity for Thyroglobulin, a diagnosis of metastatic adenocarcinoma of lung  primary is favored. However, a metastatic oncocytic thyroid carcinoma is still in the differential. Clinical and radiographic correlation is highly recommended. (HCL:kh 12-19-13) Aldona Bar MD Pathologist, Electronic Signature (Case signed 12/20/2013)  Hilton Sinclair, MD 01/08/2014, 11:52 AM PGY-3 [facility] Intern pager:  [phone], text pages welcome

## 2014-01-08 NOTE — Progress Notes (Addendum)
Seen and examined.  Agree with the documentation and management of Dr. Darene Lamer.  The plan is to hold heparin tonight, NPO and thyroidectomy in am.

## 2014-01-09 ENCOUNTER — Encounter (HOSPITAL_COMMUNITY): Payer: Medicaid Other | Admitting: Certified Registered Nurse Anesthetist

## 2014-01-09 ENCOUNTER — Encounter (HOSPITAL_COMMUNITY)
Admission: EM | Disposition: A | Discharge status: Rehabilitation Facility | Payer: Self-pay | Source: Home / Self Care | Attending: Family Medicine

## 2014-01-09 ENCOUNTER — Inpatient Hospital Stay (HOSPITAL_COMMUNITY): Payer: Medicaid Other | Admitting: Certified Registered Nurse Anesthetist

## 2014-01-09 ENCOUNTER — Encounter (HOSPITAL_COMMUNITY): Payer: Self-pay | Admitting: Certified Registered Nurse Anesthetist

## 2014-01-09 ENCOUNTER — Inpatient Hospital Stay (HOSPITAL_COMMUNITY): Payer: Medicaid Other

## 2014-01-09 DIAGNOSIS — J95821 Acute postprocedural respiratory failure: Secondary | ICD-10-CM

## 2014-01-09 HISTORY — PX: THYROIDECTOMY: SHX17

## 2014-01-09 LAB — URINALYSIS, ROUTINE W REFLEX MICROSCOPIC
Bilirubin Urine: NEGATIVE
Glucose, UA: NEGATIVE mg/dL
Ketones, ur: 15 mg/dL — AB
Nitrite: POSITIVE — AB
Protein, ur: NEGATIVE mg/dL
SPECIFIC GRAVITY, URINE: 1.004 — AB (ref 1.005–1.030)
Urobilinogen, UA: 0.2 mg/dL (ref 0.0–1.0)
pH: 7.5 (ref 5.0–8.0)

## 2014-01-09 LAB — CBC
HCT: 25.7 % — ABNORMAL LOW (ref 36.0–46.0)
HCT: 28.4 % — ABNORMAL LOW (ref 36.0–46.0)
Hemoglobin: 8.6 g/dL — ABNORMAL LOW (ref 12.0–15.0)
Hemoglobin: 9.4 g/dL — ABNORMAL LOW (ref 12.0–15.0)
MCH: 28.5 pg (ref 26.0–34.0)
MCH: 28.1 pg (ref 26.0–34.0)
MCHC: 33.5 g/dL (ref 30.0–36.0)
MCHC: 33.1 g/dL (ref 30.0–36.0)
MCV: 85.1 fL (ref 78.0–100.0)
MCV: 84.8 fL (ref 78.0–100.0)
PLATELETS: 96 10*3/uL — AB (ref 150–400)
PLATELETS: 116 10*3/uL — AB (ref 150–400)
RBC: 3.02 MIL/uL — ABNORMAL LOW (ref 3.87–5.11)
RBC: 3.35 MIL/uL — AB (ref 3.87–5.11)
RDW: 15 % (ref 11.5–15.5)
RDW: 14.7 % (ref 11.5–15.5)
WBC: 3.8 10*3/uL — ABNORMAL LOW (ref 4.0–10.5)
WBC: 6.4 10*3/uL (ref 4.0–10.5)

## 2014-01-09 LAB — BLOOD GAS, ARTERIAL
Acid-base deficit: 0.5 mmol/L (ref 0.0–2.0)
Bicarbonate: 24 mEq/L (ref 20.0–24.0)
DRAWN BY: 31288
FIO2: 0.6 %
LHR: 14 {breaths}/min
O2 SAT: 99.3 %
PEEP: 5 cmH2O
Patient temperature: 98.6
TCO2: 25.2 mmol/L (ref 0–100)
VT: 550 mL
pCO2 arterial: 41.5 mmHg (ref 35.0–45.0)
pH, Arterial: 7.38 (ref 7.350–7.450)
pO2, Arterial: 246 mmHg — ABNORMAL HIGH (ref 80.0–100.0)

## 2014-01-09 LAB — COMPREHENSIVE METABOLIC PANEL
ALBUMIN: 2.3 g/dL — AB (ref 3.5–5.2)
ALT: 45 U/L — AB (ref 0–35)
AST: 39 U/L — AB (ref 0–37)
Alkaline Phosphatase: 329 U/L — ABNORMAL HIGH (ref 39–117)
Anion gap: 15 (ref 5–15)
BUN: 9 mg/dL (ref 6–23)
CALCIUM: 8.4 mg/dL (ref 8.4–10.5)
CO2: 24 mEq/L (ref 19–32)
Chloride: 95 mEq/L — ABNORMAL LOW (ref 96–112)
Creatinine, Ser: 0.77 mg/dL (ref 0.50–1.10)
GFR calc Af Amer: >90 mL/min (ref >90)
Glucose, Bld: 212 mg/dL — ABNORMAL HIGH (ref 70–99)
Potassium: 4.2 mEq/L (ref 3.7–5.3)
SODIUM: 134 meq/L — AB (ref 137–147)
TOTAL PROTEIN: 5.6 g/dL — AB (ref 6.0–8.3)
Total Bilirubin: 1.6 mg/dL — ABNORMAL HIGH (ref 0.3–1.2)

## 2014-01-09 LAB — CALCIUM: CALCIUM: 8.6 mg/dL (ref 8.4–10.5)

## 2014-01-09 LAB — URINE MICROSCOPIC-ADD ON

## 2014-01-09 LAB — POCT I-STAT 4, (NA,K, GLUC, HGB,HCT)
GLUCOSE: 205 mg/dL — AB (ref 70–99)
HEMATOCRIT: 30 % — AB (ref 36.0–46.0)
HEMOGLOBIN: 10.2 g/dL — AB (ref 12.0–15.0)
POTASSIUM: 3.8 meq/L (ref 3.7–5.3)
Sodium: 132 mEq/L — ABNORMAL LOW (ref 137–147)

## 2014-01-09 LAB — PROTIME-INR
INR: 1.05 (ref 0.00–1.49)
Prothrombin Time: 13.8 seconds (ref 11.6–15.2)

## 2014-01-09 LAB — OCCULT BLOOD X 1 CARD TO LAB, STOOL: FECAL OCCULT BLD: NEGATIVE

## 2014-01-09 LAB — ABO/RH: ABO/RH(D): O POS

## 2014-01-09 LAB — GLUCOSE, CAPILLARY
GLUCOSE-CAPILLARY: 132 mg/dL — AB (ref 70–99)
Glucose-Capillary: 147 mg/dL — ABNORMAL HIGH (ref 70–99)
Glucose-Capillary: 129 mg/dL — ABNORMAL HIGH (ref 70–99)

## 2014-01-09 LAB — TRIGLYCERIDES: Triglycerides: 276 mg/dL — ABNORMAL HIGH (ref <150)

## 2014-01-09 LAB — HEPARIN LEVEL (UNFRACTIONATED): Heparin Unfractionated: <0.1 IU/mL — ABNORMAL LOW (ref 0.30–0.70)

## 2014-01-09 LAB — HAPTOGLOBIN: HAPTOGLOBIN: 386 mg/dL — AB (ref 45–215)

## 2014-01-09 LAB — PREPARE RBC (CROSSMATCH)

## 2014-01-09 SURGERY — THYROIDECTOMY
Anesthesia: General

## 2014-01-09 MED ORDER — ONDANSETRON HCL 4 MG/2ML IJ SOLN
INTRAMUSCULAR | Status: DC | PRN
  Administered 2014-01-09: 4 mg via INTRAVENOUS

## 2014-01-09 MED ORDER — PROPOFOL 10 MG/ML IV EMUL
INTRAVENOUS | Status: AC
  Filled 2014-01-09: qty 100

## 2014-01-09 MED ORDER — FENTANYL CITRATE 0.05 MG/ML IJ SOLN
INTRAMUSCULAR | Status: AC
  Filled 2014-01-09: qty 5

## 2014-01-09 MED ORDER — PROPOFOL 10 MG/ML IV BOLUS
INTRAVENOUS | Status: AC
  Filled 2014-01-09: qty 20

## 2014-01-09 MED ORDER — ARGATROBAN 50 MG/50ML IV SOLN
0.2500 ug/kg/min | INTRAVENOUS | Status: DC
  Filled 2014-01-09: qty 50

## 2014-01-09 MED ORDER — LACTATED RINGERS IV SOLN
INTRAVENOUS | Status: DC
  Administered 2014-01-09: 14:00:00 via INTRAVENOUS

## 2014-01-09 MED ORDER — PROPOFOL 10 MG/ML IV BOLUS
INTRAVENOUS | Status: DC | PRN
  Administered 2014-01-09: 140 mg via INTRAVENOUS

## 2014-01-09 MED ORDER — PHENYLEPHRINE 40 MCG/ML (10ML) SYRINGE FOR IV PUSH (FOR BLOOD PRESSURE SUPPORT)
PREFILLED_SYRINGE | INTRAVENOUS | Status: AC
Start: 2014-01-09 — End: 2014-01-09
  Filled 2014-01-09: qty 10

## 2014-01-09 MED ORDER — 0.9 % SODIUM CHLORIDE (POUR BTL) OPTIME
TOPICAL | Status: DC | PRN
  Administered 2014-01-09: 1000 mL

## 2014-01-09 MED ORDER — PROPOFOL 10 MG/ML IV EMUL
0.0000 ug/kg/min | INTRAVENOUS | Status: DC
  Administered 2014-01-09: 50 ug/kg/min via INTRAVENOUS
  Filled 2014-01-09 (×3): qty 100

## 2014-01-09 MED ORDER — ESMOLOL HCL 10 MG/ML IV SOLN
INTRAVENOUS | Status: AC
  Filled 2014-01-09: qty 10

## 2014-01-09 MED ORDER — CEFAZOLIN SODIUM-DEXTROSE 2-3 GM-% IV SOLR
INTRAVENOUS | Status: AC
  Filled 2014-01-09: qty 50

## 2014-01-09 MED ORDER — DEXAMETHASONE SODIUM PHOSPHATE 4 MG/ML IJ SOLN
INTRAMUSCULAR | Status: AC
  Filled 2014-01-09: qty 1

## 2014-01-09 MED ORDER — ROCURONIUM BROMIDE 50 MG/5ML IV SOLN
INTRAVENOUS | Status: AC
  Filled 2014-01-09: qty 1

## 2014-01-09 MED ORDER — ALBUMIN HUMAN 5 % IV SOLN
INTRAVENOUS | Status: DC | PRN
  Administered 2014-01-09 (×2): via INTRAVENOUS

## 2014-01-09 MED ORDER — ONDANSETRON HCL 4 MG/2ML IJ SOLN
INTRAMUSCULAR | Status: AC
  Filled 2014-01-09: qty 2

## 2014-01-09 MED ORDER — PHENYLEPHRINE HCL 10 MG/ML IJ SOLN
10.0000 mg | INTRAVENOUS | Status: DC | PRN
  Administered 2014-01-09: 20 ug/min via INTRAVENOUS

## 2014-01-09 MED ORDER — PANTOPRAZOLE SODIUM 40 MG IV SOLR
40.0000 mg | Freq: Every day | INTRAVENOUS | Status: DC
  Administered 2014-01-10 – 2014-01-11 (×3): 40 mg via INTRAVENOUS
  Filled 2014-01-09 (×6): qty 40

## 2014-01-09 MED ORDER — ACETAMINOPHEN 10 MG/ML IV SOLN
INTRAVENOUS | Status: AC
  Filled 2014-01-09: qty 100

## 2014-01-09 MED ORDER — ARTIFICIAL TEARS OP OINT
TOPICAL_OINTMENT | OPHTHALMIC | Status: AC
  Filled 2014-01-09: qty 3.5

## 2014-01-09 MED ORDER — ESMOLOL HCL 10 MG/ML IV SOLN
INTRAVENOUS | Status: DC | PRN
  Administered 2014-01-09: 20 mg via INTRAVENOUS

## 2014-01-09 MED ORDER — NEOSTIGMINE METHYLSULFATE 10 MG/10ML IV SOLN
INTRAVENOUS | Status: AC
  Filled 2014-01-09: qty 1

## 2014-01-09 MED ORDER — INSULIN ASPART 100 UNIT/ML ~~LOC~~ SOLN
2.0000 [IU] | SUBCUTANEOUS | Status: DC
  Administered 2014-01-09: 6 [IU] via SUBCUTANEOUS
  Administered 2014-01-10: 2 [IU] via SUBCUTANEOUS
  Administered 2014-01-10: 4 [IU] via SUBCUTANEOUS
  Administered 2014-01-10: 2 [IU] via SUBCUTANEOUS
  Administered 2014-01-10 – 2014-01-11 (×4): 4 [IU] via SUBCUTANEOUS
  Administered 2014-01-11: 2 [IU] via SUBCUTANEOUS
  Administered 2014-01-11: 4 [IU] via SUBCUTANEOUS
  Administered 2014-01-11: 2 [IU] via SUBCUTANEOUS

## 2014-01-09 MED ORDER — CEFAZOLIN SODIUM-DEXTROSE 2-3 GM-% IV SOLR
INTRAVENOUS | Status: DC | PRN
  Administered 2014-01-09: 2 g via INTRAVENOUS

## 2014-01-09 MED ORDER — MIDAZOLAM HCL 2 MG/2ML IJ SOLN
INTRAMUSCULAR | Status: AC
  Filled 2014-01-09: qty 2

## 2014-01-09 MED ORDER — FENTANYL CITRATE 0.05 MG/ML IJ SOLN: 100.0000 ug | INTRAMUSCULAR | Status: DC | PRN

## 2014-01-09 MED ORDER — PHENYLEPHRINE 40 MCG/ML (10ML) SYRINGE FOR IV PUSH (FOR BLOOD PRESSURE SUPPORT)
PREFILLED_SYRINGE | INTRAVENOUS | Status: AC
  Filled 2014-01-09: qty 20

## 2014-01-09 MED ORDER — ACETAMINOPHEN 10 MG/ML IV SOLN
1000.0000 mg | Freq: Once | INTRAVENOUS | Status: AC
  Administered 2014-01-09: 1000 mg via INTRAVENOUS

## 2014-01-09 MED ORDER — CHLORHEXIDINE GLUCONATE 0.12 % MT SOLN
15.0000 mL | Freq: Two times a day (BID) | OROMUCOSAL | Status: DC
  Administered 2014-01-09 – 2014-01-10 (×3): 15 mL via OROMUCOSAL
  Filled 2014-01-09 (×3): qty 15

## 2014-01-09 MED ORDER — FENTANYL BOLUS VIA INFUSION
50.0000 ug | INTRAVENOUS | Status: DC | PRN
  Filled 2014-01-09: qty 100

## 2014-01-09 MED ORDER — GLYCOPYRROLATE 0.2 MG/ML IJ SOLN
INTRAMUSCULAR | Status: AC
  Filled 2014-01-09: qty 4

## 2014-01-09 MED ORDER — SCOPOLAMINE 1 MG/3DAYS TD PT72
MEDICATED_PATCH | TRANSDERMAL | Status: AC
  Administered 2014-01-09: 1 via TRANSDERMAL
  Filled 2014-01-09: qty 1

## 2014-01-09 MED ORDER — MIDAZOLAM HCL 5 MG/5ML IJ SOLN
INTRAMUSCULAR | Status: DC | PRN
  Administered 2014-01-09 (×2): 1 mg via INTRAVENOUS

## 2014-01-09 MED ORDER — LIDOCAINE HCL (CARDIAC) 20 MG/ML IV SOLN
INTRAVENOUS | Status: DC | PRN
  Administered 2014-01-09: 100 mg via INTRAVENOUS

## 2014-01-09 MED ORDER — SUCCINYLCHOLINE CHLORIDE 20 MG/ML IJ SOLN
INTRAMUSCULAR | Status: DC | PRN
  Administered 2014-01-09: 160 mg via INTRAVENOUS

## 2014-01-09 MED ORDER — FENTANYL CITRATE 0.05 MG/ML IJ SOLN: 50.0000 ug | Freq: Once | INTRAMUSCULAR | Status: DC

## 2014-01-09 MED ORDER — PHENYLEPHRINE HCL 10 MG/ML IJ SOLN
INTRAMUSCULAR | Status: DC | PRN
  Administered 2014-01-09: 120 ug via INTRAVENOUS

## 2014-01-09 MED ORDER — SODIUM CHLORIDE 0.9 % IV SOLN
0.0000 ug/h | INTRAVENOUS | Status: DC
  Administered 2014-01-09: 50 ug/h via INTRAVENOUS
  Filled 2014-01-09: qty 50

## 2014-01-09 MED ORDER — LACTATED RINGERS IV SOLN
INTRAVENOUS | Status: DC | PRN
  Administered 2014-01-09 (×2): via INTRAVENOUS

## 2014-01-09 MED ORDER — SODIUM CHLORIDE 0.9 % IJ SOLN
INTRAMUSCULAR | Status: AC
  Filled 2014-01-09: qty 10

## 2014-01-09 MED ORDER — ROCURONIUM BROMIDE 100 MG/10ML IV SOLN
INTRAVENOUS | Status: DC | PRN
  Administered 2014-01-09: 20 mg via INTRAVENOUS
  Administered 2014-01-09: 10 mg via INTRAVENOUS
  Administered 2014-01-09: 20 mg via INTRAVENOUS
  Administered 2014-01-09 (×2): 10 mg via INTRAVENOUS

## 2014-01-09 MED ORDER — DEXAMETHASONE SODIUM PHOSPHATE 4 MG/ML IJ SOLN
INTRAMUSCULAR | Status: DC | PRN
  Administered 2014-01-09: 4 mg via INTRAVENOUS

## 2014-01-09 MED ORDER — EPHEDRINE SULFATE 50 MG/ML IJ SOLN
INTRAMUSCULAR | Status: AC
  Filled 2014-01-09: qty 1

## 2014-01-09 MED ORDER — CETYLPYRIDINIUM CHLORIDE 0.05 % MT LIQD
7.0000 mL | Freq: Four times a day (QID) | OROMUCOSAL | Status: DC
  Administered 2014-01-10 – 2014-01-19 (×24): 7 mL via OROMUCOSAL

## 2014-01-09 MED ORDER — BACITRACIN ZINC 500 UNIT/GM EX OINT
TOPICAL_OINTMENT | CUTANEOUS | Status: AC
  Filled 2014-01-09: qty 15

## 2014-01-09 MED ORDER — LIDOCAINE HCL (CARDIAC) 20 MG/ML IV SOLN
INTRAVENOUS | Status: AC
  Filled 2014-01-09: qty 5

## 2014-01-09 MED ORDER — ARTIFICIAL TEARS OP OINT
TOPICAL_OINTMENT | OPHTHALMIC | Status: DC | PRN
  Administered 2014-01-09: 1 via OPHTHALMIC

## 2014-01-09 MED ORDER — FENTANYL CITRATE 0.05 MG/ML IJ SOLN
INTRAMUSCULAR | Status: DC | PRN
  Administered 2014-01-09: 50 ug via INTRAVENOUS
  Administered 2014-01-09 (×5): 100 ug via INTRAVENOUS
  Administered 2014-01-09 (×4): 50 ug via INTRAVENOUS

## 2014-01-09 SURGICAL SUPPLY — 36 items
CANISTER SUCTION 2500CC (MISCELLANEOUS) ×3 IMPLANT
CLEANER TIP ELECTROSURG 2X2 (MISCELLANEOUS) ×3 IMPLANT
CONT SPEC 4OZ CLIKSEAL STRL BL (MISCELLANEOUS) ×3 IMPLANT
CORDS BIPOLAR (ELECTRODE) ×3 IMPLANT
COVER SURGICAL LIGHT HANDLE (MISCELLANEOUS) ×3 IMPLANT
DERMABOND ADVANCED (GAUZE/BANDAGES/DRESSINGS) ×2
DERMABOND ADVANCED .7 DNX12 (GAUZE/BANDAGES/DRESSINGS) ×1 IMPLANT
DRAIN HEMOVAC 7FR (DRAIN) IMPLANT
DRAIN SNY 10 ROU (WOUND CARE) IMPLANT
DRAIN WOUND SNY 15 RND (WOUND CARE) ×3 IMPLANT
ELECT COATED BLADE 2.86 ST (ELECTRODE) ×3 IMPLANT
ELECT REM PT RETURN 9FT ADLT (ELECTROSURGICAL) ×3
ELECTRODE REM PT RTRN 9FT ADLT (ELECTROSURGICAL) ×1 IMPLANT
EVACUATOR SILICONE 100CC (DRAIN) ×3 IMPLANT
FORCEPS BIPOLAR SPETZLER 8 1.0 (NEUROSURGERY SUPPLIES) ×3 IMPLANT
GAUZE SPONGE 4X4 16PLY XRAY LF (GAUZE/BANDAGES/DRESSINGS) ×15 IMPLANT
GLOVE BIO SURGEON STRL SZ 6.5 (GLOVE) IMPLANT
GLOVE BIO SURGEONS STRL SZ 6.5 (GLOVE)
GLOVE ECLIPSE 7.5 STRL STRAW (GLOVE) ×3 IMPLANT
GOWN STRL REUS W/ TWL LRG LVL3 (GOWN DISPOSABLE) ×2 IMPLANT
GOWN STRL REUS W/TWL LRG LVL3 (GOWN DISPOSABLE) ×4
KIT BASIN OR (CUSTOM PROCEDURE TRAY) ×3 IMPLANT
KIT ROOM TURNOVER OR (KITS) ×3 IMPLANT
NEEDLE 27GAX1X1/2 (NEEDLE) IMPLANT
NS IRRIG 1000ML POUR BTL (IV SOLUTION) ×3 IMPLANT
PAD ARMBOARD 7.5X6 YLW CONV (MISCELLANEOUS) ×6 IMPLANT
PENCIL FOOT CONTROL (ELECTRODE) ×3 IMPLANT
SHEARS HARMONIC 9CM CVD (BLADE) ×3 IMPLANT
STAPLER VISISTAT 35W (STAPLE) ×3 IMPLANT
SUT CHROMIC 4 0 PS 2 18 (SUTURE) ×6 IMPLANT
SUT ETHILON 3 0 PS 1 (SUTURE) ×3 IMPLANT
SUT SILK 3 0 REEL (SUTURE) IMPLANT
SUT SILK 4 0 REEL (SUTURE) ×3 IMPLANT
TOWEL OR 17X24 6PK STRL BLUE (TOWEL DISPOSABLE) ×3 IMPLANT
TOWEL OR 17X26 10 PK STRL BLUE (TOWEL DISPOSABLE) ×3 IMPLANT
TRAY ENT MC OR (CUSTOM PROCEDURE TRAY) ×3 IMPLANT

## 2014-01-09 NOTE — Progress Notes (Signed)
Chaplain initiated follow up. Chaplain asked about spiritual and emotional needs of pt leading up to surgery this afternoon. Pt surrounded by family and pastor came by earlier today. Chaplain offered prayer and indicated if the pt needed her for the chaplain to be paged. Will continue to follow.  01/09/14 1000  Clinical Encounter Type  Visited With Patient and family together  Visit Type Follow-up;Spiritual support  Spiritual Encounters  Spiritual Needs Prayer  Sarah [name] 01/09/2014 10:09 AM

## 2014-01-09 NOTE — Op Note (Signed)
OPERATIVE REPORT  DATE OF SURGERY: 01/09/2014  PATIENT:  Sarah [name],  48 y.o. female  PRE-OPERATIVE DIAGNOSIS:  THYROID MASS  POST-OPERATIVE DIAGNOSIS:  same  PROCEDURE:  Procedure(s): TOTAL THYROIDECTOMY  SURGEON:  Beckie Salts, MD  ASSISTANTS: Jolene Provost PA  ANESTHESIA:   General   EBL:  150 ml  DRAINS: 15 French round   LOCAL MEDICATIONS USED:  None  SPECIMEN:  Total thyroidectomy, suture marks left lobe  COUNTS:  Correct  PROCEDURE DETAILS: The patient was taken to the operating room and placed on the operating table in the supine position. A shoulder roll was placed beneath the shoulder blades and the neck was extended. The neck was prepped and draped in a standard fashion. A low collar transverse incision was outlined marking pen and was incised with electrocautery. Dissection was continued down through the platysma layer. Subplatysmal flaps were elevated superiorly to the thyroid cartilage and inferiorly to the clavicle. Self-retaining thyroid retractor was used throughout the case.  The midline fascia was divided. The thyroid was massive in size, and was adherent to the strap muscles. The central portion of the strap muscles were kept intact with the thyroid. The lateral part of the straps were reflected laterally exposing the lateral part of the thyroid on both sides. The thyroid was firm, sticky, and wrapped around the posterior aspect of the trachea on both sides. Using a combination of blunt finger dissection and sharp dissection, multiple vessels were ligated, multiple vessels were divided using the harmonic dissector, and ultimately the gland was brought forward. On both sides, the recurrent nerve was identified and preserved. Parathyroid glands were not distinctly identified. The gland was dissected off the trachea. The trachea was not overtly invaded by this disease process. The specimen was sent for pathologic evaluation. Hemostasis was completed on both  sides. The wound was irrigated with saline and suctioned. A drain was placed through a separate stab incision inferiorly and secured in place with suture. The midline fascia was reapproximated with chromic suture. The platysma layer was reapproximated with chromic and skin staples were used for completion of the closure. Because of the complexity of the dissection, and the fact that she was a very difficult intubation, we have decided to keep her intubated overnight in ICU. She was transferred there in satisfactory condition.   PATIENT DISPOSITION:  To ICU, stable

## 2014-01-09 NOTE — Progress Notes (Signed)
[facility] Progress Note Patient Name: Sarah [name] DOB: [birth year] MRN: [id number]   Date of Service  01/09/2014  HPI/Events of Note  48 yo woman presents intubated from OR s/p total thyroidectomy.  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   eICU Interventions  Vent and sedation orders placed. Full consult to follow      Intervention Category Major Interventions: Respiratory failure - evaluation and management  Nicolina Hirt S. 01/09/2014, 7:41 PM

## 2014-01-09 NOTE — Anesthesia Preprocedure Evaluation (Addendum)
Anesthesia Evaluation  Patient identified by MRN, date of birth, ID band Patient awake    Reviewed: Allergy & Precautions, H&P , NPO status , Patient's Chart, lab work & pertinent test results  Airway Mallampati: III TM Distance: >3 FB Neck ROM: Limited    Dental  (+) Teeth Intact, Dental Advisory Given   Pulmonary asthma , PE breath sounds clear to auscultation        Cardiovascular hypertension, Pt. on medications Rhythm:Regular Rate:Normal     Neuro/Psych Pt paraplegic    GI/Hepatic   Endo/Other  Morbid obesity  Renal/GU      Musculoskeletal  (+) Arthritis -,   Abdominal   Peds  Hematology   Anesthesia Other Findings   Reproductive/Obstetrics                        Anesthesia Physical  Anesthesia Plan  ASA: III  Anesthesia Plan: General   Post-op Pain Management:    Induction: Intravenous  Airway Management Planned: Oral ETT and Video Laryngoscope Planned  Additional Equipment:   Intra-op Plan:   Post-operative Plan: Extubation in OR  Informed Consent: I have reviewed the patients History and Physical, chart, labs and discussed the procedure including the risks, benefits and alternatives for the proposed anesthesia with the patient or authorized representative who has indicated his/her understanding and acceptance.   Dental advisory given  Plan Discussed with: CRNA and Surgeon  Anesthesia Plan Comments:        Anesthesia Quick Evaluation

## 2014-01-09 NOTE — Progress Notes (Signed)
Family Medicine Teaching Service Daily Progress Note Intern Pager: [phone]  Patient name: Sarah [name] Medical record number: [id number] Date of birth: [birth year] Age: 48 y.o. Gender: female  Primary Care Provider: No PCP Per Patient Consultants: Neurosurgery, IR, Oncology, Endocrinology Code Status: Full  Pt Overview and Major Events to Date:  - 9/22: Pt. Admitted with lower extremity weakness.  - 9/23: Pt. Found to have mass at T-3 level with spinal compression.  - 9/24: Surgery performed for decompression and biopsy overnight. Pt. Post-op and doing well. 3cm Suprahilar mass on CT Chest, PE also identified. ?Thyroid etiology, but much less likely for mets. Oncology consulted.  - 9/25 - 9/29: Awaiting pathology results with plan for oncology to see once path returns. Pt. With little neurological improvement. Started with light PT.  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 Oncology on board. Neurosurgery to comment on need for spinal stabilization surgery.  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 - 10/8: Underwent spinal stabilization surg. - 10/10: Restarted heparin  - 10/13: Dr. GerkiHarlow Asale to perform surg as inpatient; will seek other consultations - 10/14: Dr. RosenConstance HolsterNT consulted. He will see pt on 10/15. - 10/16: ENT to perform Thyroidectomy Monday 10/19 - 10/17: All cell lines (WBC, Hgb, Plts) continue to drop: Checking Labs as below - 10/18: Midnight, holding heparin in preparation for surgery 10/19  Assessment and Plan: Sarah [name] 48 y.29 female presenting with continued lower extremity weakness at baseline after discharge from hospital on 9/21. PMH is significant for obesity,  HTN, and ?History of Hypothyroidism previously on Synthroid per patient.   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  Neurosurgery took her to the OR on 9/23, and biopsied as well as performed laminectomy for decompression. Surgical pathology with metastatic adenocarcinoma. Likely poor prognosis. At time of decompression, was thought to be most likely from lung mets. - Pain control:  Oxy IR prn. IV Morphine for breakthrough - Neurosurgery following: Patient was taken for spinal stabilization 10/9.  - Oncology following (Dr. MagriJana Hakimpatient; Dr. MohamJulien Nordmanntpatient); Neurosurgery (Dr. CabbeChristella Noadocrinology (Dr. Kerr)Buddy DutyT (Dr. 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  - Large thyroid goiter noted. TSH normal; ultrasound on 10/1, Biopsy on 10/2 - FNA results: "clusters of neoplastic cells w/ granular/oncogenic cytoplasm--similar to previous bx" - Dr. RosenConstance Holsterseen patient. Greatly appreciate the input. 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  - PT recommendations CIR vs SNF with 24hour supervision. Will plan dispo based on goals of care discussion. - Per SW; SNF is an option, but will need estimated time of DC; pt currently would rather go home than to SNF but is willing to consider other options. - Foley catheter in place for urinary retention; Stooling regularly, no sxs of fecal retention.  - Discussed Recs for PT  with Neurosurgery. Light PT at this point. Pt. Not to get UOB.  - TED hose  #Pulmonary Embolus - Found on CT Chest  Pt. With brief episodes of tachycardia, but otherwise  asymptomatic with O2 sats 100% on 2L [place] >> now on Room Air.  Blood pressures stable. Lower extremity Venous Duplex negative for DVT.  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 ESR 59 (9/22)  - Now on IV heparin -- Will begin bridging as soon as possible (post surgical); waiting until thyroidectomy.  - Per pharm--recommendation for long-term Lovenox; Difficult due to self-pay, CM is working on solution. If it is possible, Dose would be 153m SQ q12h - Will continue to monitor VS. Stable at this time.  # Anemia and Thrombocytopenia: Hgb and Plts have been trending down slowly since spinal stabilization surgery on 10/7 (Hgb 12.3) Hgb currently stable @  8.6 on 10/18. WBC stable at 4.1. Plts continue down trending. retic production index = 0.6 indicating inadequate bone marrow response. 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 No signs of end organ damage so low suspicion for TTP, DIC - FOBT pending; nursing staff informed AGAIN - Continue to follow CBC - Check: PT (nl), PTT, blood smear (polychromasia), CMET (ALP 398 - high), LDH (262 - high), Haptoglobin - Oncology contacted and recommended HIT panel as well as starting Arixtra over Lovenox until HIT panel results are processed - HIT panel: pending - Arixtra 177mto be started 6-8 hours after thyroidectomy is complete to reduce bleed risk.  - Will consider Iron, TIBC, B12, and folate if Hgb downtrend continues  #Hyperglycemia -  Likely diabetic with PCOS given A1C of 6.7. CBG's 180's - 200s.  - Patient informed of elevated A1C and likely diagnosis of DMII. She agrees with [place] this is a marginal issue at this point, and we will recommend treatment based upon our goals of care discussion.  - Continue to monitor.  - Resistant SSI; 15U Lantus - Holding today's lantus but continuing resistant SSI and HS scale. Reassess after  surgery.  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 PCOS vs. Adrenal patholog. Hypertensive and not hypotensive. Testing during this hospitalization after having recently received steroids would be largely non-diagnostic. Though, could also be a combination of isolated processes i.e. Clitoromegaly, Hirsutism 2/2 obesity. - Will leave for Outpatient f/u -- [place] assess ovaries vs 21-hydroxylase/ACTH/cortisol.  - TSH, Cortisol WNL - A1C : 6.7 - consistent with PCOS given evidence of cystic structures in pelvis.   # UTI, 2/2 foley catheter - UCx = Proteus - Resolved - dysuria and suprapubic pain noted PM 10/1. - Cipro 25080mID (10/2 started) -- treated for 7 days -- DC 10/9.  #HTN- BP well controlled -continue losartan.   #Obesity-  - consistent with potential PCOS.   FEN/GI: Reg diet; KVO Prophylaxis: on full dose IV heparin (until held tonight for thyroidectomy tomorrow), SCDs  Disposition: Pending Goals of Care discussion and endocrinology recommendations. Possible discharge early next week.  Subjective: [name] doing well. Still stooling regularly. She says she is ready for surgery. Today she gave me the 100% decision that she wants to go to CIR. Will begin moving forward on this.  Objective: Temp:  [97.9 F (36.6 C)-100 F (37.8 C)] 97.9 F (36.6 C) (10/19 0957) Pulse Rate:  [87-104] 95 (10/19 0957)  Resp:  [16-18] 18 (10/19 0957) BP: (103-133)/(57-76) 103/57 mmHg (10/19 0957) SpO2:  [96 %-100 %] 96 % (10/19 0957) Physical Exam: General: NAD, AAOx3, morbidly obese  HEENT: AT/[place],  neck supple, J-line in place Cardio: distant heart sounds due to habitus Lungs: Normal effort, CTA bilateral anteriorly. Abdomen: Soft and nontender, nondistended, No organomegaly noted.  Extremities: no edema,  No calf tenderness, feels a "tickle" sensation Skin: No lesions or rashes noted Neuro: cranial nerves grossly  normal. Normal speech;   Laboratory:  Recent Labs Lab 01/07/14 0550 01/08/14 0640 01/09/14 0527  WBC 4.0 4.1 3.8*  HGB 8.6* 8.6* 8.6*  HCT 25.5* 25.4* 25.7*  PLT 111* 93* 96*    Recent Labs Lab 01/05/14 0715 01/07/14 1304  NA 131* 134*  K 3.8 3.8  CL 95* 94*  CO2 26 29  BUN 9 7  CREATININE 0.71 0.67  CALCIUM 8.0* 8.3*  PROT 5.1* 5.6*  BILITOT 1.0 0.7  ALKPHOS 273* 398*  ALT 38* 54*  AST 32 38*  GLUCOSE 150* 196*   Urinalysis    Component Value Date/Time   COLORURINE AMBER* 12/23/2013 1058   APPEARANCEUR TURBID* 12/23/2013 1058   LABSPEC 1.018 12/23/2013 1058   PHURINE 7.5 12/23/2013 1058   GLUCOSEU NEGATIVE 12/23/2013 1058   HGBUR LARGE* 12/23/2013 1058   BILIRUBINUR NEGATIVE 12/23/2013 1058   KETONESUR NEGATIVE 12/23/2013 1058   PROTEINUR 100* 12/23/2013 1058   UROBILINOGEN 0.2 12/23/2013 1058   NITRITE NEGATIVE 12/23/2013 1058   LEUKOCYTESUR LARGE* 12/23/2013 1058    Phos - 3.9 Mg - 2.2 CRP - 11.6 ESR - 59 CK - 56 B12 - 209 Random Cortisol - 25.4 A1C - 6.7 TSH - 2.13 RPR - nonreactive HIV - nonreactive UPT - Negative  Imaging/Diagnostic Tests:  CT Chest Abd/ Pelvis 9/23:  CLINICAL DATA: Destructive metastatic lesion of the T3 vertebral  body with no known primary carcinoma.  EXAM:  CT CHEST, ABDOMEN, AND PELVIS WITH CONTRAST  TECHNIQUE:  Multidetector CT imaging of the chest, abdomen and pelvis was  performed following the standard protocol during bolus  administration of intravenous contrast.  CONTRAST: 144m OMNIPAQUE IOHEXOL 300 MG/ML SOLN  COMPARISON: MRI of the cervical spine earlier today.  FINDINGS:  CT CHEST FINDINGS  Within the chest, a right suprahilar mass measures approximately 3.0  x 2.7 x 2.2 cm. This either represents a nodal metastasis or central  lung mass such as small cell carcinoma. A number of smaller  mediastinal lymph nodes are also identified in the right  paratracheal region, prevascular space and AP window.   Destructive lesion of the T3 vertebral body noted which nearly  replaces the entire vertebral body with tumor extension posteriorly  into the epidural space causing significant compression of the  spinal cord. Paravertebral extension of tumor is also suspected  lateral and anterior to the vertebral body, right greater than left.  Massive enlargement of the thyroid gland present with transverse  dimensions of approximately 7.7 x 8.5 cm. The thyroid gland is  incompletely visualized as the neck was not included. Central  dystrophic calcification identified. The enlarged thyroid gland does  cause significant mass effect on the airway with evidence of  tracheal deviation to the right and tracheal narrowing at the  thoracic inlet. The enlarged thyroid gland extends into the superior  mediastinum.  There is evidence of pulmonary embolism with nonocclusive thrombus  identified in the right middle lobe and lower lobe pulmonary  arteries. Pulmonary arterial opacification is not optimal on  the  standard venous phase evaluation of the chest but the right sided  pulmonary embolism is clearly present.  There are small bilateral pulmonary nodules. Right-sided pulmonary  nodule measuring 6 mm likely localizes to the very top of the lower  lobe near the major fissure. Left upper lobe nodule measures 7 mm.  Lingular nodule measures 7 mm. Left lower lobe nodule measures 7 mm.  Faint peripheral right middle lobe nodule measures 3 mm. Inferior  right middle lobe nodule measures 6 mm. Several small nodules in the  posterior right upper lobe identified in the 3-4 mm range.  No evidence of pleural or pericardial fluid. No other bony lesions  identified in the thorax.  CT ABDOMEN AND PELVIS FINDINGS  Small low-density lesion in the left lobe of the liver measures 7 mm  and likely represents a cyst. No other hepatic abnormalities. The  gallbladder, pancreas, spleen, adrenal glands and kidneys are   unremarkable. No adenopathy is identified in the abdomen or pelvis.  Bowel shows normal caliber without evidence of obstruction, lesion  or inflammation. No abnormal fluid collections.  The bladder is moderately distended. Elongated cystic changes in  both adnexal regions identified which may represent a combination of  ovarian cysts and potentially hydrosalpinx. Correlation with pelvic  ultrasound is recommended. No hernias are identified. The uterus  appears unremarkable by CT. No bony lesions are identified in the  abdomen or pelvis.  IMPRESSION:  1. 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  2. 3 cm right suprahilar mass may represent nodal metastasis versus  primary central tumor. Next number multiple small bilateral  pulmonary nodules consistent with metastatic disease.  3. Acute nonocclusive pulmonary embolism in the right middle lobe  and right lower lobe pulmonary arteries.  4. Massive thyroid goiter causing tracheal deviation to the right  and narrowing of the trachea.  5. Tubular cystic abnormalities of both adnexal regions which may be  a combination of cystic changes in the ovaries and potentially  hydrosalpinx. Correlation recommended with pelvic ultrasound to  exclude any suspicious cystic lesions of the adnexae.  6. Critical Value/emergent results were called by telephone at the  time of interpretation on 12/14/2013 at 4:52 pm to Dr. Tamala Julian of the  [facility] Teaching Service, who verbally acknowledged these  results.  Electronically Signed  By: Aletta Edouard M.D.  On: 12/14/2013 16:53   MRI T-spine 9/23:  CLINICAL DATA: Lower extremity weakness. Worsening back pain.  Compressive upper thoracic mass partially visualized on cervical  spine MRI as well as chest CT with spinal cord compression.  EXAM:  MRI THORACIC SPINE WITHOUT  CONTRAST  TECHNIQUE:  Multiplanar, multisequence MR imaging of the thoracic spine was  performed. No intravenous contrast was administered.  COMPARISON: Cervical spine MRI and chest CT earlier today  FINDINGS:  There is a T3 compression fracture with approximately 50% height  loss centrally. As described on cervical spine MRI and chest CT  earlier today, there is abnormal soft tissue replacing the majority  of the T3 vertebral body extending into the pedicles as well as into  the ventral epidural space with moderate cord compression. Epidural  extension is slightly greater on the right and extends into the  inferior aspect of the right T2-3 neural foramen as well as superior  aspect of the right T3-4 neural foramen. There is also abnormal  paravertebral soft tissue  laterally and anteriorly as seen on CT.  Central ventral epidural material posterior to the T4 vertebral body  may represent tumor or hematoma. Mild T2 hyperintensity is present  in the spinal cord at the T3 level extending inferiorly to the T4-5  disc space level consistent with mild edema. Mild edema is present  in the posterior paraspinal soft tissues of the upper thoracic  spine. No other vertebral body lesions are identified.  There is a moderate-sized central disc extrusion at T7-8 which  contacts and mildly flattens the ventral spinal cord with mild  spinal stenosis. A small left central disc protrusion is present at  T8-9 with at most minimal impression on the spinal cord. Massive  enlargement of the left thyroid lobe is partially visualized.  Mediastinal and lung findings are better demonstrated on recent  chest CT.  IMPRESSION:  1. T3 vertebral body mass with pathologic compression fracture and  epidural tumor resulting in moderate cord compression with evidence  of mild spinal cord edema.  2. T7-8 disc extrusion resulting in mild spinal stenosis.   MRI Head / C-Spine 9/23:  CLINICAL DATA: 48 year old female  who presented with back pain and  lower extremity numbness, with continued inadequately explained  lower extremity weakness following Hospital discharge on 12/12/2013.  Initial encounter.  EXAM:  MRI HEAD WITHOUT AND WITH CONTRAST  MRI CERVICAL SPINE WITHOUT AND WITH CONTRAST  TECHNIQUE:  Multiplanar, multiecho pulse sequences of the brain and surrounding  structures, and cervical spine, to include the craniocervical  junction and cervicothoracic junction, were obtained without and  with intravenous contrast.  CONTRAST: 20 mL MultiHance.  COMPARISON: Lumbar MRI 12/10/2013.  FINDINGS:  MRI HEAD FINDINGS  Cerebral volume is normal. No restricted diffusion to suggest acute  infarction. No midline shift, mass effect, evidence of mass lesion,  ventriculomegaly, extra-axial collection or acute intracranial  hemorrhage. Cervicomedullary junction and pituitary are within  normal limits. Major intracranial vascular flow voids are preserved.  Pearline Cables and white matter signal is within normal limits for age  throughout the brain. No abnormal enhancement identified, pulsation  artifact prominent on axial post-contrast images.  Large body habitus. Small right frontal scalp lipoma (series 10,  image 37). Visible internal auditory structures appear normal. Mild  right mastoid fluid. Minor paranasal sinus mucosal thickening.  Visualized orbit soft tissues are within normal limits. Partially  visible thyromegaly greater on the left, see below. Normal bone  marrow signal.  MRI CERVICAL SPINE FINDINGS  Large body habitus resulting in wrap artifact on axial images.  However, there is massive thyroid enlargement evident, with the left  lobe measuring at least 7.2 x 5.2 x 11.2 cm (AP by transverse by  CC). The trachea is displaced to the right at the thoracic inlet and  mildly compressed. There is other regional mass effect including  sheath. On the left carotid  Fairly preserved cervical lordosis. No  marrow edema or evidence of  acute osseous abnormality. Cervicomedullary junction is within  normal limits. Spinal cord signal is within normal limits at all  visualized levels. No abnormal intradural enhancement.  C2-C3: Negative.  C3-C4: Disc osteophyte complex, facet and ligament flavum  hypertrophy. Along with a degree of congenital canal narrowing,  there is mild spinal stenosis and moderate biforaminal stenosis.  C4-C5: Mild uncovertebral and facet hypertrophy. Mild right C5  foraminal stenosis.  C5-C6: Circumferential disc osteophyte complex. Along with a degree  of congenital canal narrowing there is mild spinal stenosis.  Moderate right C6 foraminal stenosis.  C6-C7: Negative except for mild facet hypertrophy.  C7-T1: Negative.  T1-T2: Negative.  Faintly visualized at the T3 level in the upper thoracic spine there  is a lobulated solidly enhancing mass which appears to extend into  the epidural space from the T3 vertebral body. This is inseparable  from the spinal cord which is likely compressed. See series 2000  image 7 and series 1500, image 6. This is not included on axial  images.  IMPRESSION:  1. Faintly visible but aggressive appearing enhancing mass at the T3  level of the upper thoracic spine, likely with spinal cord  compression evaluate further are with thoracic MRI without and with  contrast.  2. Normal for age MRI appearance of the brain.  3. Massive thyroid enlargement, greater on the left. Left thyroid  goiter/mass measuring at least 7.2 x 5.2 x 11.2 cm. Mass effect at  the thoracic inlet including on the trachea.  4. Mild chronic disc and endplate degeneration in the cervical spine  at C3-C4 and C5-C6 resulting in borderline to mild spinal stenosis,  but no cervical spinal cord mass effect or signal abnormality.  Electronically Signed:  By: Lars Pinks M.D.  On: 12/14/2013 09:50   MRI L-Spine 9/19:  CLINICAL DATA: Back pain with bilateral leg numbness,  worsening  with oral steroids.  EXAM:  MRI LUMBAR SPINE WITHOUT CONTRAST  TECHNIQUE:  Multiplanar, multisequence MR imaging of the lumbar spine was  performed. No intravenous contrast was administered.  COMPARISON: CT 12/09/2013  FINDINGS:  There is no degenerative disease at L2-3 or above. The discs are  normal in signal characteristics and morphology. The spinal canal  and foramina are widely patent. The distal cord and conus are normal  with conus tip at L1. There are small benign and insignificant  hemangiomas within the L2 and L3 vertebral bodies.  L3-4: Minimal desiccation of the disc. Mild circumferential bulging  slightly more prominent towards the left. Bilateral facet  degeneration and hypertrophy. Mild narrowing of the lateral recesses  without apparent neural compression. Mild foraminal narrowing on the  left without apparent neural compression.  L4-5: Desiccation of the disc with circumferential protrusion.  Bilateral facet and ligamentous hypertrophy. Stenosis of both  lateral recesses without definite focal neural compression. Mild  foraminal narrowing bilaterally.  L5-S1: Desiccation of the disc with loss of height. Endplate  osteophytes and bulging of the disc more prominent towards the left.  Bilateral facet degeneration and hypertrophy left more than right.  Stenosis of the subarticular lateral recess on the left that could  compress the S1 nerve root. Mild foraminal narrowing left more than  right but without apparent compression of the exiting L5 nerve  roots.  Serpiginous fluid-filled structure in the left pelvis could  represent hydrosalpinx. This is not primarily or completely imaged.  IMPRESSION:  Degenerative disc disease and degenerative facet disease in the  lower lumbar spine that could be a cause of lumbago.  Lateral recess and foraminal narrowing at L3-4, L4-5 and L5-S1 as  described above that could be a cause of focal neural compression.  In  general, this is more pronounced on the left than on the right.  In particular, there is potential for left S1 nerve root compression  in the subarticular lateral recess at L5-S1.  Electronically Signed  By: Nelson Chimes M.D.  On: 12/10/2013 08:45   CT L-Spine 9/18:  CLINICAL DATA: Low back pain with difficulty walking.  EXAM:  CT LUMBAR SPINE WITHOUT CONTRAST  TECHNIQUE:  Multidetector CT imaging of the lumbar spine was performed without  intravenous contrast administration. Multiplanar CT image  reconstructions were also generated.  COMPARISON: None.  FINDINGS:  There is no fracture or spondylolisthesis. There is moderately  severe disc space narrowing at L5-S1 with slight vacuum phenomenon  at this level. There is moderate narrowing at L4-5. There are  prominent anterior osteophytes at L2, L3, L4, and L5.  At T12-L1, there is moderate facet hypertrophy bilaterally. There is  no nerve root edema or effacement. No disc extrusion or stenosis.  At L1-2, there is moderate facet hypertrophy bilaterally. There is  mild diffuse disc bulging. There is no nerve root edema or  effacement. No disc extrusion or stenosis.  At L2-3, there is broad-based disc bulging and moderate facet  hypertrophy bilaterally. There is borderline narrowing of the thecal  sac with borderline spinal stenosis. No disc extrusion.  At L3-4, there is severe facet osteoarthritic change bilaterally,  more pronounced on the left than on the right. There is broad-based  disc bulging and moderate ligamentum flavum hypertrophy. These  findings in concert lead to moderate generalized spinal stenosis.  At L4-5, there is broad-based disc protrusion and severe facet  osteoarthritic change bilaterally. These findings in concert lead to  moderate generalized spinal stenosis. No frank disc extrusion.  At L5-S1, there is severe facet osteoarthritic change on the left  and moderate facet osteoarthritic change on the right. There  is  broad-based disc protrusion. There is exit foraminal narrowing  bilaterally, more severe on the left than on the right, due to  osteophytic change. No frank disc extrusion or stenosis is seen at  this level.  There are no paraspinous lesions.  IMPRESSION:  Spinal stenosis at L3-4 and L4-5, multifactorial. Borderline spinal  stenosis at L2-3. There does appear to be a degree of congenital  narrowing of the canal which contributes to spinal stenosis.  Multilevel arthropathy.  No frank disc extrusion.  No fracture or spondylolisthesis.  Electronically Signed  By: Lowella Grip M.D.  On: 12/09/2013 13:50   CXR 9/13:  CLINICAL DATA: Upper back pain for 5 weeks. Dyspnea on exertion.  Nonsmoker. History of hypertension.  EXAM:  CHEST 2 VIEW  COMPARISON: 04/15/2006  FINDINGS:  The heart is mildly enlarged. There is elevation of right  hemidiaphragm, stable in appearance. There is streaky density  favored to be within the right lower lobe, consistent with  atelectasis or infiltrate. No pulmonary edema. No pleural effusions.  There are moderate degenerative changes in the thoracic spine.  IMPRESSION:  Right lower lobe atelectasis or infiltrate.  Electronically Signed  By: Shon Hale M.D.  On: 12/04/2013 19:26  Venous Duplex 9/24:  Summary:  - No evidence of deep vein thrombosis involving the right lower extremity. - No evidence of deep vein thrombosis involving the left lower extremity.   Surgical Pathology Report: 12/21/2013 Diagnosis Soft tissue mass, simple excision, Thoracic Epidural Tumor - METASTATIC CARCINOMA. PLEASE SEE COMMENT. Microscopic Comment There are clusters of malignant cells with abundant eosinophilic cytoplasm. A battery of immunostains were performed and the tumor cells show the following profile: Cytokeratin 7 - strongly positive Cytokeratin 8/18 - strongly positive TTF-1 - strongly positive. Thyroglobulin - negative CD10 -  negative Cytokeratin 20 - negative S100 - negative Vimentin - negative The control stained appropriately. The overall findings are diagnostic for metastatic carcinoma. 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 However,  a metastatic oncocytic thyroid carcinoma is still in the differential. Clinical and radiographic correlation is highly recommended. (HCL:kh 12-19-13) Aldona Bar MD Pathologist, Electronic Signature (Case signed 12/20/2013)  Elberta Leatherwood, MD 01/09/2014, 12:07 PM PGY-1 [facility] pager: [phone], text pages welcome

## 2014-01-09 NOTE — Consult Note (Signed)
PULMONARY / CRITICAL CARE MEDICINE   Name: Sarah Sarah [name] MRN: [id number] DOB: Oct 27, 1965    ADMISSION DATE:  12/12/2013 CONSULTATION DATE:  01/09/2014  REFERRING MD :  DR. Nori Riis  CHIEF COMPLAINT:  Lower extremity weakness  INITIAL PRESENTATION: 48 year old Sarah [name] admitted 9/21 for profound lower extremity weakness/paralysis. She was found to have severe cord compression at T3 and a large thyroid mass. She has since undergone several neurosurgical procedures with minimal improvement. 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 10/19 she was taken to OR for total thyroidectomy. She remains on vent post op to ICU. PCCM to see for ICU care and vent management.  oted patien had difficult intubation preop and periop couse tough dissection of thyroid   STUDIES:  MRI brain/tspine/Lspine 9/23 > Mass at T3 with significant cord compression, Massive thyroid enlargement.  CT chest/abd 9/23 >  3cm superhilar mass, several small pulmonary nodules. Nonocclusive PE RML and RLL. Tubular cystic abnormalities of both adnexal regions  10/1 US neck > enlarged thyroid gland without focal lesion.   SIGNIFICANT EVENTS: - 9/22: Pt. Admitted with lower extremity weakness.  - 9/23: Pt. Found to have mass at T-3 level with spinal compression.  - 9/24: Surgery performed for decompression and biopsy overnight. Pt. Post-op and doing well. 3cm Suprahilar mass on CT Chest, PE also identified. ?Thyroid etiology, but much less likely for mets. Oncology consulted.  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 Oncology on board. Neurosurgery to comment on need for spinal stabilization surgery.  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  - 10/8: Underwent T1-T5 spinal stabilization surg.  - 10/10: Restarted heparin  - 10/13: Dr. Harlow Asa unable to perform surg  as inpatient; will seek other consultations  - 10/16: ENT to perform Thyroidectomy Monday 10/19  - 10/17: All cell lines (WBC, Hgb, Plts) continue to drop - 10/19: Thyroidectomy - to ICU on ventilator.    HISTORY OF PRESENT ILLNESS: 48 year old Sarah [name] admitted 9/21 for profound lower extremity weakness/paralysis. She was found to have severe cord compression at T3 and a large thyroid mass. She has since undergone several neurosurgical procedures with minimal improvement. 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 Palliative vs aggressive treatment options. Family deciding. 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 10/19 she was taken to OR for total thyroidectomy. She remains on vent post op to ICU. PCCM to see for ICU care and vent management.   Noted patien had difficult intubation preop and periop couse tough dissection of thyroid  PAST MEDICAL HISTORY :   has a past medical history of Hypertension; DDD (degenerative disc disease), cervical; DJD (degenerative joint disease); Obesity; and Chronic back pain.  has past surgical history that includes Tonsillectomy; Laminectomy (N/A, 12/14/2013); and Posterior lumbar fusion 4 level (N/A, 12/30/2013). Prior to Admission medications   Medication Sig Start Date End Date Taking? Authorizing Provider  albuterol (PROVENTIL HFA;VENTOLIN HFA) 108 (90 BASE) MCG/ACT inhaler Inhale 2 puffs into the lungs every 6 (six) hours as needed for wheezing or shortness of breath.   Yes Historical Provider, MD  diazepam (VALIUM) 5 MG tablet Take 5 mg by mouth 3 (three) times daily as needed for muscle spasms (pain).   Yes Historical Provider, MD  ibuprofen (ADVIL,MOTRIN) 200 MG tablet Take 600 mg by  mouth every 6 (six) hours as needed for mild pain.    Yes Historical Provider, MD  cyclobenzaprine (FLEXERIL) 5 MG tablet Take 1 tablet (5 mg total) by mouth 3 (three) times daily as needed for muscle spasms. 12/12/13   Dimas Chyle, MD  diclofenac (VOLTAREN) 75 MG EC tablet Take 1 tablet (75 mg total) by mouth 2 (two) times daily. 12/09/13   Lenox Ahr, PA-C  HYDROcodone-acetaminophen (NORCO/VICODIN) 5-325 MG per tablet Take 1 tablet by mouth every 4 (four) hours as needed. 12/09/13   Lenox Ahr, PA-C  losartan (COZAAR) 25 MG tablet Take 0.5 tablets (12.5 mg total) by mouth daily. 12/12/13   Dimas Chyle, MD   Allergies  Allergen Reactions  . Tramadol Nausea And Vomiting    FAMILY HISTORY:  has no family status information on file.  SOCIAL HISTORY:  reports that she has never smoked. She does not have any smokeless tobacco history on file. She reports that she does not drink alcohol or use illicit drugs.  REVIEW OF SYSTEMS:  Unable, intubated  SUBJECTIVE:   VITAL SIGNS: Temp:  [97.9 F (36.6 C)-98.6 F (37 C)] 97.9 F (36.6 C) (10/19 0957) Pulse Rate:  [87-95] 95 (10/19 0957) Resp:  [14-18] 14 (10/19 2001) BP: (103-133)/(57-82) 115/82 mmHg (10/19 2001) SpO2:  [96 %-100 %] 96 % (10/19 0957) FiO2 (%):  [60 %] 60 % (10/19 2001) HEMODYNAMICS:   VENTILATOR SETTINGS: Vent Mode:  [-] PRVC FiO2 (%):  [60 %] 60 % Set Rate:  [14 bmp] 14 bmp Vt Set:  [550 mL] 550 mL PEEP:  [5 cmH20] 5 cmH20 Plateau Pressure:  [22 cmH20] 22 cmH20 INTAKE / OUTPUT:  Intake/Output Summary (Last 24 hours) at 01/09/14 2011 Last data filed at 01/09/14 1925  Gross per 24 hour  Intake   2765 ml  Output   2250 ml  Net    515 ml    PHYSICAL EXAMINATION: General:  Sarah Sarah [name] on vent in NAD Neuro:  Sedated  HEENT:  Sarah Sarah [name], Large neck., surgical incision across proximal chest, closed with staples, oozing. Drain in place, draining blood.  Cardiovascular:  Tachy, regular, no MRG Lungs:  Distant lung sounds, clear. Synchronous with vent.  Abdomen:  Sarah, hypoactive bowel sounds Musculoskeletal:  No acute deformity Skin:  Surgical incision to proximal anterior chest  LABS:  CBC  Recent Labs Lab 01/07/14 0550  01/08/14 0640 01/09/14 0527 01/09/14 1742  WBC 4.0 4.1 3.8*  --   HGB 8.6* 8.6* 8.6* 10.2*  HCT 25.5* 25.4* 25.7* 30.0*  PLT 111* 93* 96*  --    Coag's  Recent Labs Lab 01/07/14 1304  APTT 163*  INR 1.08   BMET  Recent Labs Lab 01/05/14 0715 01/07/14 1304 01/09/14 1742  NA 131* 134* 132*  K 3.8 3.8 3.8  CL 95* 94*  --   CO2 26 29  --   BUN 9 7  --   CREATININE 0.71 0.67  --   GLUCOSE 150* 196* 205*   Electrolytes  Recent Labs Lab 01/05/14 0715 01/07/14 1304  CALCIUM 8.0* 8.3*   Sepsis Markers No results found for this basename: LATICACIDVEN, PROCALCITON, O2SATVEN,  in the last 168 hours ABG No results found for this basename: PHART, PCO2ART, PO2ART,  in the last 168 hours Liver Enzymes  Recent Labs Lab 01/05/14 0715 01/07/14 1304  AST 32 38*  ALT 38* 54*  ALKPHOS 273* 398*  BILITOT 1.0 0.7  ALBUMIN 1.7* 1.8*   Cardiac Enzymes No  results found for this basename: TROPONINI, PROBNP,  in the last 168 hours Glucose  Recent Labs Lab 01/08/14 1115 01/08/14 1755 01/08/14 2116 01/09/14 0645 01/09/14 1143 01/09/14 1347  GLUCAP 159* 217* 227* 132* 147* 129*    Imaging No results found.   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 (s/p decompression, fixation, throidectomy) Pulmonary embolism Anemia, Thrombocytopenia,  Concern for HIT  P:  D/C heparin, was started on argatroban but will Hold anticoagulation as oozing from surgical site and drain Follow coags STAT CBC HIT panel pending Oncology following: therapy course awaiting [place] discussion  INFECTIOUS A:   Concern for UTI given appearance of urine  P:   Follow UA Monitor clinically Cipro 10/2 > 10/9  ENDOCRINE A:  Thyroidectomy due to Hurthle cell carcinoma Hyperglycemia Likely PCOS  P:   Follow TSH CBG monitoring and SSI Hold lantus ENT following   NEUROLOGIC A:   Spinal cord compression T3 status/post laminectomy and t1-t5 fixation BLE paraparesis/sensory loss  P:   RASS goal: -1 Propofol gtt for sedation Fentanyl gtt for analgesia Management per neurosurgery Holding diazepam  FAMILY Updates: 10/19 Interdisciplinary Family Meeting:  Due by 01/17/14   Georgann Housekeeper, ACNP [place] [facility] Pager [phone] or [phone]   STAFF NOTE: I, Dr Ann Lions have personally reviewed patient's available data, including medical history, events of note, physical examination and test results as part of my evaluation. I have discussed with resident/NP and other care providers such as pharmacist, RN and RRT.  In addition,  I personally evaluated patient and elicited key findings of  Acute post op respiratory failure following thyroidectomy. Difficult airway preop and difficult perioperative coourse.  Control pain and sedation. Extubation decision to made in context of goals of care. High bleeding risk at thyroidectomy site: will hold off on anticoagulation -restart based on advise fromENT. [name] Kitchen  Rest per NP/medical resident whose note is outlined above and that I agree with  The patient is critically ill with multiple organ systems failure and requires high complexity decision making for assessment and support, frequent evaluation and titration of therapies, application of advanced monitoring technologies and extensive  interpretation of multiple databases.   Critical Care Time devoted to patient care services described in this note is  30  Minutes. This time reflects time of care of this signee Dr Brand Males. This critical care time does not reflect procedure time, or teaching time or supervisory time of PA/NP/Med student/Med Resident etc but could involve care discussion time    Dr. Brand Males, M.D., [facility].C.P Pulmonary and Critical Care Medicine Staff Physician [place] [facility] Pager: [phone], If no answer or between  15:00h - 7:00h: call [phone]  01/09/2014 10:32 PM

## 2014-01-09 NOTE — Progress Notes (Signed)
Received a phone call from the admitting team regarding appropriate management of anticoagulation in the perioperative setting. [name] currently on heparin drip per pulmonary embolism that was detected the CT scan done 12/14/2013. She was known to have Hurthle cell cancer of the thyroid and is undergoing thyroidectomy today.  Patient account has been slowly declining over the past 6 days it went from 167-96,000 and the concern was raised that the patient may have heparin-induced cytopenia.  Recommendation: Once surgery is complete, use Arixtra instead of heparin drip and to transition her to oral Xarelto once all her surgeries are complete. I instructed the hospitalist team to obtain heparin antibodies as well.

## 2014-01-09 NOTE — Progress Notes (Addendum)
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 Tramadol Nausea And Vomiting    Patient Measurements: Height: 5\' 11"  (180.3 cm) Weight: 313 lb 15 oz (142.4 kg) IBW/kg (Calculated) : 70.8   Vital Signs: Temp: 97.9 F (36.6 C) (10/19 0957) Temp Source: Oral (10/19 0957) BP: 115/82 mmHg (10/19 2001) Pulse Rate: 95 (10/19 0957)  Labs:  Recent Labs  01/07/14 0550 01/07/14 1304 01/08/14 0640 01/09/14 0527 01/09/14 1742  HGB 8.6*  --  8.6* 8.6* 10.2*  HCT 25.5*  --  25.4* 25.7* 30.0*  PLT 111*  --  93* 96*  --   APTT  --  163*  --   --   --   LABPROT  --  14.1  --   --   --   INR  --  1.08  --   --   --   HEPARINUNFRC 0.56  --  0.57 <0.10*  --   CREATININE  --  0.67  --   --   --     Estimated Creatinine Clearance: 136.4 ml/min (by C-G formula based on Cr of 0.67).   Medications:  Scheduled:  . acidophilus  2 capsule Oral TID  . [START ON 01/10/2014] antiseptic oral rinse  7 mL Mouth Rinse QID  . chlorhexidine  15 mL Mouth Rinse BID  . insulin aspart  0-20 Units Subcutaneous TID WC  . insulin aspart  0-5 Units Subcutaneous QHS  . losartan  12.5 mg Oral Daily  . polyethylene glycol  17 g Oral Daily  . senna  1 tablet Oral BID  . sodium chloride  10-40 mL Intracatheter Q12H    Assessment: 48 yo female with RLL PE now s/p thyroidectomy. 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 [name] on mechanical ventilation post OR and I discussed use of Arixtra versus argatroban with Dr. Gerarda Fraction. Plans are for short-term intubation but if there is a need for longer term intubation argatroban will be easier to manage around any procedural needs.  AST/ALT= 38/54 on 10/17 and t bili= 0.7.   Also spoke with Dr. Constance Holster who performed the thyroidectomy and due to the extent of the surgery he requested to wait until the morning of 10/20 to begin argatroban. He also asked to  target a lower aPTT goal (typical aPTT goal on argatroban is 50-90)  Goal of Therapy:  APTT= 50-70 Monitor platelets by anticoagulation protocol: Yes   Plan:  -Will begin argatroban on 01/10/14 at 8am -Start argatroban at 0.39mcg/kg/min (will begin at lower infusion rate due to lower aPTT goal) -aPTT 2 hours after infusion start -Will follow HIT panel results  Hildred Laser, Pharm D 01/09/2014 8:46 PM

## 2014-01-09 NOTE — H&P (View-Only) (Signed)
Reason for Consult: Thyroid mass Referring Physician: Sara L Neal, MD  Sarah [name] is an 48 y.o. female.  HPI: Very large thyroid mass, metastatic lesion in the thoracic spine, positive for carcinoma. Core biopsy of the thyroid reveals oncocytic cells, not diagnostic for carcinoma. Concern is that this could be a metastatic thyroid carcinoma. His history of radioiodine ablation about 20 years ago for unknown disease.  Past Medical History  Diagnosis Date  . Hypertension   . DDD (degenerative disc disease), cervical   . DJD (degenerative joint disease)   . Obesity   . Chronic back pain     Past Surgical History  Procedure Laterality Date  . Tonsillectomy    . Laminectomy N/A 12/14/2013    Procedure: THORACIC LAMINECTOMY FOR TUMOR THORACIC THREE;  Surgeon: [name] Cabbell, MD;  Location: [facility];  Service: Neurosurgery;  Laterality: N/A;  THORACIC LAMINECTOMY FOR TUMOR THORACIC THREE  . Posterior lumbar fusion 4 level N/A 12/30/2013    Procedure: Thoracic one-Thoracic five posterior thoracic fusion with pedicle screws;  Surgeon: [name] Cabbell, MD;  Location: [facility];  Service: Neurosurgery;  Laterality: N/A;  Thoracic one-Thoracic five posterior thoracic fusion with pedicle screws    Family History  Problem Relation Age of Onset  . Hypertension Mother   . Diabetes Mother   . Hypertension Father   . Stroke Father     Social History:  reports that she has never smoked. She does not have any smokeless tobacco history on file. She reports that she does not drink alcohol or use illicit drugs.  Allergies:  Allergies  Allergen Reactions  . Tramadol Nausea And Vomiting    Medications: Reviewed  Results for orders placed during the hospital encounter of 12/12/13 (from the past 48 hour(s))  GLUCOSE, CAPILLARY     Status: Abnormal   Collection Time    01/03/14 11:29 AM      Result Value Ref Range   Glucose-Capillary 214 (*) 70 - 99 mg/dL  HEPARIN LEVEL (UNFRACTIONATED)      Status: Abnormal   Collection Time    01/03/14  2:43 PM      Result Value Ref Range   Heparin Unfractionated 0.22 (*) 0.30 - 0.70 IU/mL   Comment:            IF HEPARIN RESULTS ARE BELOW     EXPECTED VALUES, AND PATIENT     DOSAGE HAS BEEN CONFIRMED,     SUGGEST FOLLOW UP TESTING     OF ANTITHROMBIN III LEVELS.  GLUCOSE, CAPILLARY     Status: Abnormal   Collection Time    01/03/14  4:29 PM      Result Value Ref Range   Glucose-Capillary 154 (*) 70 - 99 mg/dL  GLUCOSE, CAPILLARY     Status: Abnormal   Collection Time    01/03/14  9:05 PM      Result Value Ref Range   Glucose-Capillary 187 (*) 70 - 99 mg/dL   Comment 1 Documented in Chart     Comment 2 Notify RN    HEPARIN LEVEL (UNFRACTIONATED)     Status: Abnormal   Collection Time    01/03/14  9:58 PM      Result Value Ref Range   Heparin Unfractionated 0.17 (*) 0.30 - 0.70 IU/mL   Comment:            IF HEPARIN RESULTS ARE BELOW     EXPECTED VALUES, AND PATIENT     DOSAGE HAS   BEEN CONFIRMED,     SUGGEST FOLLOW UP TESTING     OF ANTITHROMBIN III LEVELS.  CBC     Status: Abnormal   Collection Time    01/04/14  4:25 AM      Result Value Ref Range   WBC 6.6  4.0 - 10.5 K/uL   RBC 2.95 (*) 3.87 - 5.11 MIL/uL   Hemoglobin 8.5 (*) 12.0 - 15.0 g/dL   HCT 24.6 (*) 36.0 - 46.0 %   MCV 83.4  78.0 - 100.0 fL   MCH 28.8  26.0 - 34.0 pg   MCHC 34.6  30.0 - 36.0 g/dL   RDW 14.8  11.5 - 15.5 %   Platelets 148 (*) 150 - 400 K/uL  HEPARIN LEVEL (UNFRACTIONATED)     Status: Abnormal   Collection Time    01/04/14  6:25 AM      Result Value Ref Range   Heparin Unfractionated 0.87 (*) 0.30 - 0.70 IU/mL   Comment:            IF HEPARIN RESULTS ARE BELOW     EXPECTED VALUES, AND PATIENT     DOSAGE HAS BEEN CONFIRMED,     SUGGEST FOLLOW UP TESTING     OF ANTITHROMBIN III LEVELS.  GLUCOSE, CAPILLARY     Status: Abnormal   Collection Time    01/04/14  7:08 AM      Result Value Ref Range   Glucose-Capillary 170 (*) 70 - 99 mg/dL    Comment 1 Documented in Chart     Comment 2 Notify RN    GLUCOSE, CAPILLARY     Status: Abnormal   Collection Time    01/04/14 11:02 AM      Result Value Ref Range   Glucose-Capillary 216 (*) 70 - 99 mg/dL  HEPARIN LEVEL (UNFRACTIONATED)     Status: None   Collection Time    01/04/14  3:04 PM      Result Value Ref Range   Heparin Unfractionated 0.43  0.30 - 0.70 IU/mL   Comment:            IF HEPARIN RESULTS ARE BELOW     EXPECTED VALUES, AND PATIENT     DOSAGE HAS BEEN CONFIRMED,     SUGGEST FOLLOW UP TESTING     OF ANTITHROMBIN III LEVELS.  GLUCOSE, CAPILLARY     Status: Abnormal   Collection Time    01/04/14  4:58 PM      Result Value Ref Range   Glucose-Capillary 189 (*) 70 - 99 mg/dL  GLUCOSE, CAPILLARY     Status: Abnormal   Collection Time    01/04/14  9:03 PM      Result Value Ref Range   Glucose-Capillary 164 (*) 70 - 99 mg/dL   Comment 1 Documented in Chart     Comment 2 Notify RN    GLUCOSE, CAPILLARY     Status: Abnormal   Collection Time    01/05/14  6:44 AM      Result Value Ref Range   Glucose-Capillary 223 (*) 70 - 99 mg/dL  CBC     Status: Abnormal   Collection Time    01/05/14  7:15 AM      Result Value Ref Range   WBC 5.5  4.0 - 10.5 K/uL   RBC 2.87 (*) 3.87 - 5.11 MIL/uL   Hemoglobin 8.1 (*) 12.0 - 15.0 g/dL   HCT 24.1 (*) 36.0 - 46.0 %   MCV   84.0  78.0 - 100.0 fL   MCH 28.2  26.0 - 34.0 pg   MCHC 33.6  30.0 - 36.0 g/dL   RDW 14.6  11.5 - 15.5 %   Platelets 137 (*) 150 - 400 K/uL  COMPREHENSIVE METABOLIC PANEL     Status: Abnormal   Collection Time    01/05/14  7:15 AM      Result Value Ref Range   Sodium 131 (*) 137 - 147 mEq/L   Potassium 3.8  3.7 - 5.3 mEq/L   Chloride 95 (*) 96 - 112 mEq/L   CO2 26  19 - 32 mEq/L   Glucose, Bld 150 (*) 70 - 99 mg/dL   BUN 9  6 - 23 mg/dL   Creatinine, Ser 0.71  0.50 - 1.10 mg/dL   Calcium 8.0 (*) 8.4 - 10.5 mg/dL   Total Protein 5.1 (*) 6.0 - 8.3 g/dL   Albumin 1.7 (*) 3.5 - 5.2 g/dL   AST 32  0  - 37 U/L   ALT 38 (*) 0 - 35 U/L   Alkaline Phosphatase 273 (*) 39 - 117 U/L   Total Bilirubin 1.0  0.3 - 1.2 mg/dL   GFR calc non Af Amer >90  >90 mL/min   GFR calc Af Amer >90  >90 mL/min   Comment: (NOTE)     The eGFR has been calculated using the CKD EPI equation.     This calculation has not been validated in all clinical situations.     eGFR's persistently <90 mL/min signify possible Chronic Kidney     Disease.   Anion gap 10  5 - 15  HEPARIN LEVEL (UNFRACTIONATED)     Status: None   Collection Time    01/05/14  7:15 AM      Result Value Ref Range   Heparin Unfractionated 0.36  0.30 - 0.70 IU/mL   Comment:            IF HEPARIN RESULTS ARE BELOW     EXPECTED VALUES, AND PATIENT     DOSAGE HAS BEEN CONFIRMED,     SUGGEST FOLLOW UP TESTING     OF ANTITHROMBIN III LEVELS.    No results found.  UXN:ATFTDDUK except as listed in admit H&P  Blood pressure 113/52, pulse 100, temperature 98.6 F (37 C), temperature source Oral, resp. rate 18, height 5' 11" (1.803 m), weight 142.4 kg (313 lb 15 oz), last menstrual period 11/28/2013, SpO2 100.00%.  PHYSICAL EXAM: Overall appearance:  Healthy appearing, obese lady, in no distress Head:  Normocephalic, atraumatic. Ears: External ears are normal. Nose: External nose is healthy in appearance. Internal nasal exam free of any lesions or obstruction. Oral Cavity:  There are no mucosal lesions or masses identified. Neuro:  No identifiable cranial neurologic deficits. Neck: Large, firm thyroid mass bilaterally.  Studies Reviewed: CT scan reviewed, biopsies reviewed.  Procedures: none   Assessment/Plan: Agree with recommendation for total thyroidectomy. We discussed the nature of the surgery, the risks to the recurrent laryngeal laryngeal nerves and parathyroid glands. All questions were answered. We will schedule as soon as possible. We'll need to find out about stopping the IV heparin first.  Teana Lindahl 01/05/2014, 9:33 AM

## 2014-01-09 NOTE — Progress Notes (Signed)
UR complete.  Chade Pitner RN, MSN 

## 2014-01-09 NOTE — Transfer of Care (Signed)
Immediate Anesthesia Transfer of Care Note  Patient: Sarah [name]  Procedure(s) Performed: Procedure(s): TOTAL THYROIDECTOMY (N/A)  Patient Location: PACU  Anesthesia Type:General  Level of Consciousness: sedated and Patient remains intubated per anesthesia plan  Airway & Oxygen Therapy: Patient remains intubated per anesthesia plan  Post-op Assessment: Report given to PACU RN and Post -op Vital signs reviewed and stable  Post vital signs: Reviewed and stable  Complications: No apparent anesthesia complications

## 2014-01-09 NOTE — Progress Notes (Signed)
Update: Spoke with pharmacist about anticoagulation post-op. He suggested Argatroban in place of Arixtra for anticoagulation due to the fact Ms. Creek will be intubated until tomorrow at least and we do not know what her course will be at this point. Both mediations are appropriate for anticoagulation in the setting of HIT. If hematology feels strongly about Arixtra we can transition patient to that medication in 1-2 days and then to Xarelto as they mentioned. If they do not mind either way then we can just switch to Xarelto from Argatroban.

## 2014-01-09 NOTE — Interval H&P Note (Signed)
History and Physical Interval Note:  01/09/2014 3:01 PM  Sarah [name]  has presented today for surgery, with the diagnosis of THYROID MASS  The various methods of treatment have been discussed with the patient and family. After consideration of risks, benefits and other options for treatment, the patient has consented to  Procedure(s): TOTAL THYROIDECTOMY (N/A) as a surgical intervention .  The patient's history has been reviewed, patient examined, no change in status, stable for surgery.  I have reviewed the patient's chart and labs.  Questions were answered to the patient's satisfaction.     [name]

## 2014-01-09 NOTE — Progress Notes (Signed)
I discussed with  Dr Alease Frame.  I agree with their plans documented in their progress note.

## 2014-01-09 NOTE — Anesthesia Procedure Notes (Signed)
Procedure Name: Intubation Date/Time: 01/09/2014 3:50 PM Performed by: Susa Loffler Pre-anesthesia Checklist: Patient identified, Timeout performed, Emergency Drugs available, Suction available and Patient being monitored Patient Re-evaluated:Patient Re-evaluated prior to inductionOxygen Delivery Method: Circle system utilized Preoxygenation: Pre-oxygenation with 100% oxygen Intubation Type: IV induction Ventilation: Mask ventilation without difficulty and Oral airway inserted - appropriate to patient size Grade View: Grade II Tube type: Oral Tube size: 7.0 mm Number of attempts: 1 Airway Equipment and Method: Stylet and Video-laryngoscopy Placement Confirmation: ETT inserted through vocal cords under direct vision,  positive ETCO2 and breath sounds checked- equal and bilateral Secured at: 22 cm Tube secured with: Tape Dental Injury: Teeth and Oropharynx as per pre-operative assessment  Difficulty Due To: Difficulty was anticipated, Difficult Airway- due to reduced neck mobility and Difficult Airway- due to limited oral opening

## 2014-01-09 NOTE — Progress Notes (Signed)
[facility] Progress Note Patient Name: Sarah [name] DOB: 1966/02/07 MRN: [id number]   Date of Service  01/09/2014  HPI/Events of Note  Best Practice  eICU Interventions  Protonix for stress ulcer propy while intubated     Intervention Category Intermediate Interventions: Best-practice therapies (e.g. DVT, beta blocker, etc.)  Sarah [name] 01/09/2014, 11:43 PM

## 2014-01-09 NOTE — Progress Notes (Signed)
PT Cancellation Note  Patient Details Name: Sarah [name] MRN: [id number] DOB: 02/07/1966   Cancelled Treatment:    Reason Eval/Treat Not Completed: Patient at procedure or test/unavailable Pt is in the OR for thyroidectomy.  PT will check back tomorrow to see if cleared for OOB activity again.  Thanks,   Barbarann Ehlers. Kym Fenter, PT, DPT [phone]   01/09/2014, 2:19 PM

## 2014-01-10 ENCOUNTER — Encounter (HOSPITAL_COMMUNITY): Payer: Self-pay | Admitting: Otolaryngology

## 2014-01-10 LAB — CBC
HCT: 26.2 % — ABNORMAL LOW (ref 36.0–46.0)
Hemoglobin: 8.7 g/dL — ABNORMAL LOW (ref 12.0–15.0)
MCH: 28 pg (ref 26.0–34.0)
MCHC: 33.2 g/dL (ref 30.0–36.0)
MCV: 84.2 fL (ref 78.0–100.0)
Platelets: 124 10*3/uL — ABNORMAL LOW (ref 150–400)
RBC: 3.11 MIL/uL — AB (ref 3.87–5.11)
RDW: 14.8 % (ref 11.5–15.5)
WBC: 6.1 10*3/uL (ref 4.0–10.5)

## 2014-01-10 LAB — CALCIUM
Calcium: 8.6 mg/dL (ref 8.4–10.5)
Calcium: 8.3 mg/dL — ABNORMAL LOW (ref 8.4–10.5)
Calcium: 8.4 mg/dL (ref 8.4–10.5)

## 2014-01-10 LAB — GLUCOSE, CAPILLARY
GLUCOSE-CAPILLARY: 215 mg/dL — AB (ref 70–99)
GLUCOSE-CAPILLARY: 155 mg/dL — AB (ref 70–99)
GLUCOSE-CAPILLARY: 130 mg/dL — AB (ref 70–99)
Glucose-Capillary: 157 mg/dL — ABNORMAL HIGH (ref 70–99)
Glucose-Capillary: 161 mg/dL — ABNORMAL HIGH (ref 70–99)
Glucose-Capillary: 131 mg/dL — ABNORMAL HIGH (ref 70–99)

## 2014-01-10 LAB — CALCIUM, IONIZED: CALCIUM ION: 1.18 mmol/L (ref 1.12–1.23)

## 2014-01-10 LAB — TSH: TSH: 1.06 u[IU]/mL (ref 0.350–4.500)

## 2014-01-10 MED ORDER — CIPROFLOXACIN IN D5W 400 MG/200ML IV SOLN
400.0000 mg | Freq: Two times a day (BID) | INTRAVENOUS | Status: DC
  Administered 2014-01-10 – 2014-01-13 (×7): 400 mg via INTRAVENOUS
  Filled 2014-01-10 (×8): qty 200

## 2014-01-10 MED ORDER — FENTANYL CITRATE 0.05 MG/ML IJ SOLN: 12.5000 ug | INTRAMUSCULAR | Status: DC | PRN

## 2014-01-10 MED ORDER — LEVOTHYROXINE SODIUM 100 MCG IV SOLR
125.0000 ug | Freq: Every day | INTRAVENOUS | Status: DC
  Administered 2014-01-11 – 2014-01-15 (×5): 125 ug via INTRAVENOUS
  Filled 2014-01-10 (×6): qty 10

## 2014-01-10 NOTE — Progress Notes (Signed)
PT Cancellation Note  Patient Details Name: Sarah [name] MRN: [id number] DOB: 1965-06-13   Cancelled Treatment:    Reason Eval/Treat Not Completed: Medical issues which prohibited therapy (pt remains on vent post-op and will need medical clearance to continue therapy)   [name] 01/10/2014, 7:37 AM Elwyn Reach, [place]

## 2014-01-10 NOTE — Progress Notes (Signed)
PULMONARY / CRITICAL CARE MEDICINE   Name: Sarah [name] MRN: [id number] DOB: 1966/02/11    ADMISSION DATE:  12/12/2013 CONSULTATION DATE:  01/09/2014  REFERRING MD :  DR. Nori Riis  CHIEF COMPLAINT:  Lower extremity weakness  INITIAL PRESENTATION: 48 year old female admitted 9/21 for profound lower extremity weakness/paralysis. She was found to have severe cord compression at T3 and a large thyroid mass. She has since undergone several neurosurgical procedures with minimal improvement. 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 10/19 she was taken to OR for total thyroidectomy. She remains on vent post op to ICU. PCCM to see for ICU care and vent management.  oted patien had difficult intubation preop and periop couse tough dissection of thyroid  STUDIES:  MRI brain/tspine/Lspine 9/23 > Mass at T3 with significant cord compression, Massive thyroid enlargement.  CT chest/abd 9/23 >  3cm superhilar mass, several small pulmonary nodules. Nonocclusive PE RML and RLL. Tubular cystic abnormalities of both adnexal regions  10/1 US neck > enlarged thyroid gland without focal lesion.   SIGNIFICANT EVENTS: - 9/22: Pt. Admitted with lower extremity weakness.  - 9/23: Pt. Found to have mass at T-3 level with spinal compression.  - 9/24: Surgery performed for decompression and biopsy overnight. Pt. Post-op and doing well. 3cm Suprahilar mass on CT Chest, PE also identified. ?Thyroid etiology, but much less likely for mets. Oncology consulted.  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 Oncology on board. Neurosurgery to comment on need for spinal stabilization surgery.  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  - 10/8: Underwent T1-T5 spinal stabilization surg.  - 10/10: Restarted heparin  - 10/13: Dr. Harlow Asa unable to perform surg as  inpatient; will seek other consultations  - 10/16: ENT to perform Thyroidectomy Monday 10/19  - 10/17: All cell lines (WBC, Hgb, Plts) continue to drop - 10/19: Thyroidectomy - to ICU on ventilator.   SUBJECTIVE:  Awake, denies discomfort sedation has been off ~ 30 minutes  VITAL SIGNS: Temp:  [97.9 F (36.6 C)-98.6 F (37 C)] 98.5 F (36.9 C) (10/20 0800) Pulse Rate:  [86-123] 92 (10/20 1000) Resp:  [8-15] 14 (10/20 1000) BP: (94-140)/(64-82) 140/81 mmHg (10/20 1000) SpO2:  [100 %] 100 % (10/20 1000) FiO2 (%):  [40 %-60 %] 40 % (10/20 0800) HEMODYNAMICS:   VENTILATOR SETTINGS: Vent Mode:  [-] PRVC FiO2 (%):  [40 %-60 %] 40 % Set Rate:  [14 bmp] 14 bmp Vt Set:  [550 mL] 550 mL PEEP:  [5 cmH20] 5 cmH20 Plateau Pressure:  [21 cmH20-22 cmH20] 21 cmH20 INTAKE / OUTPUT:  Intake/Output Summary (Last 24 hours) at 01/10/14 1039 Last data filed at 01/10/14 1000  Gross per 24 hour  Intake 3252.12 ml  Output   2405 ml  Net 847.12 ml    PHYSICAL EXAMINATION: General:  Obese female on vent in NAD Neuro:  Awake and nodding to questions, and following commands.  HEENT:  [place], Large neck., surgical incision across proximal chest, closed with staples, no bleeding. Drain in place. Cardiovascular:  Tachy, regular, no MRG Lungs:  Distant lung sounds, clear. Synchronous with vent.  Abdomen:  Obese, hypoactive bowel sounds Musculoskeletal:  No acute deformity Skin:  Surgical incision to proximal anterior chest  LABS:  CBC  Recent Labs Lab 01/09/14 0527 01/09/14 1742 01/09/14 2102 01/10/14 0400  WBC 3.8*  --  6.4 6.1  HGB 8.6* 10.2* 9.4* 8.7*  HCT 25.7* 30.0* 28.4* 26.2*  PLT 96*  --  116* 124*   Coag's  Recent Labs Lab 01/07/14 1304 01/09/14 2102  APTT 163*  --   INR 1.08 1.05   BMET  Recent Labs Lab 01/05/14 0715 01/07/14 1304 01/09/14 1742 01/09/14 2102  NA 131* 134* 132* 134*  K 3.8 3.8 3.8 4.2  CL 95* 94*  --  95*  CO2 26 29  --  24  BUN 9 7  --  9   CREATININE 0.71 0.67  --  0.77  GLUCOSE 150* 196* 205* 212*   Electrolytes  Recent Labs Lab 01/07/14 1304 01/09/14 2102 01/10/14 0400  CALCIUM 8.3* 8.6  8.4 8.6   Sepsis Markers No results found for this basename: LATICACIDVEN, PROCALCITON, O2SATVEN,  in the last 168 hours ABG  Recent Labs Lab 01/09/14 2023  PHART 7.380  PCO2ART 41.5  PO2ART 246.0*   Liver Enzymes  Recent Labs Lab 01/05/14 0715 01/07/14 1304 01/09/14 2102  AST 32 38* 39*  ALT 38* 54* 45*  ALKPHOS 273* 398* 329*  BILITOT 1.0 0.7 1.6*  ALBUMIN 1.7* 1.8* 2.3*   Cardiac Enzymes No results found for this basename: TROPONINI, PROBNP,  in the last 168 hours Glucose  Recent Labs Lab 01/09/14 0645 01/09/14 1143 01/09/14 1347 01/09/14 2201 01/10/14 0406 01/10/14 0813  GLUCAP 132* 147* 129* 215* 157* 161*    Imaging Portable Chest Xray  01/09/2014   CLINICAL DATA:  Endotracheal tube placement.  EXAM: PORTABLE CHEST - 1 VIEW  COMPARISON:  12/30/2013.  FINDINGS: New endotracheal tube tip projects 2.9 cm above the carina, well positioned.  Right internal jugular central venous catheter has its tip in the lower superior vena cava.  There are new surgical staples at the anterior neck base.  There are larger lung volumes on the current study. Mild reticular opacity is noted at the lung bases most likely atelectasis. No lung consolidation or edema. There is no pleural effusion or pneumothorax.  Cardiac silhouette is normal in size. No mediastinal or hilar masses.  There stable changes from a previous upper thoracic spine fusion.  IMPRESSION: 1. Endotracheal tube is well positioned with its tip 2.9 cm above the carina. 2. No acute cardiopulmonary disease. There is mild lung base subsegmental atelectasis, but no evidence of residual congestive heart failure/edema.   Electronically Signed   By: Lajean Manes M.D.   On: 01/09/2014 21:14     ASSESSMENT / PLAN:  PULMONARY OETT 10/19 >>> A: Acute respiratory  failure in post operative setting Difficult airway pre-op S/p thyroidectomy for Hurthle Cell - with tough dissection perioperatively  P:   SBT today 10/20, believe she is a good candidate for possible extubation in the pm once sedation clears further F/u CXR, ABG VAP prevention per protocol  CARDIOVASCULAR RIJ CVL 10/9 >>> A:  Sinus tachycardia H/o HTN  P:  Continuous cardiac monitoring Holding losartan for now  RENAL A:   No acute issues  P:   Follow BMP Replace electrolytes as indicated  GASTROINTESTINAL A:   No acute issues  P:   SUP: IV protonix Consider TF if unable to extubate 10/20 Holding miralax, zofran while NPO  HEMATOLOGIC/OCOLOGIC A:   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 (s/p decompression, fixation, throidectomy) Pulmonary embolism Anemia, Thrombocytopenia, Concern for HITT  P:  Will hold anticoagulation another day, try to start argatroban on 10/21 Follow coags HIT panel pending Oncology following:  therapy course awaiting GOC discussion  INFECTIOUS A:   Concern for UTI given appearance of urine Cipro 10/2 > 10/9 Cipro 10/20 >>  P:   UA positive > send urine cx on 10/20 Will start empiric cipro 10/20 Change foley catheter 10/21   ENDOCRINE A:  Thyroidectomy due to Hurthle cell carcinoma Hyperglycemia Likely PCOS  P:   Synthroid 178mcg IV Follow TSH CBG monitoring and SSI Hold lantus ENT following   NEUROLOGIC A:   Spinal cord compression T3 status/post laminectomy and t1-t5 fixation BLE paraparesis/sensory loss  P:   RASS goal: -1 to 0 Propofol gtt for sedation, attempt to d/c on 10/20 Fentanyl gtt for analgesia Management per neurosurgery Holding diazepam  FAMILY Updates: 10/19 and 10/20 Interdisciplinary Family Meeting:  Due by 01/17/14    The patient is critically ill with multiple organ systems failure and requires high complexity decision making for assessment and support, frequent evaluation  and titration of therapies, application of advanced monitoring technologies and extensive interpretation of multiple databases.   Critical Care Time devoted to patient care services described in this note is 35 Minutes. This time reflects time of care of this signee Dr Alfonso Patten. Lamonte Sakai. This critical care time does not reflect procedure time, or teaching time or supervisory time of PA/NP/Med student/Med Resident etc but could involve care discussion time    Baltazar Apo, MD, PhD 01/10/2014, 10:53 AM [facility] [phone] or if no answer [phone]

## 2014-01-10 NOTE — Progress Notes (Signed)
Patient ID: Sarah [name], female   DOB: 01-03-1966, 48 y.o.   MRN: [id number] Postop day 1, no problems overnight.  Calcium has remained normal.  Incision looks good. There is no hematoma or swelling. JP is functioning. She is sedated, intubated on ventilator.  Extubate per critical care. I am anticipating good vocal cord mobility but there is always a chance of unilateral or bilateral paresis or paralysis. Ideally, I would like to wait one or 2 more days before resuming anticoagulation.

## 2014-01-10 NOTE — Progress Notes (Addendum)
Patient ID: Sarah [name], female   DOB: 12-28-1965, 48 y.o.   MRN: [id number] BP 130/72  Pulse 99  Temp(Src) 98 F (36.7 C) (Oral)  Resp 20  Ht 5' 11" (1.803 m)  Wt 142.4 kg (313 lb 15 oz)  BMI 43.80 kg/m2  SpO2 100%  LMP 11/28/2013 Doing well post thyroidectomy No neurologic changes Wound is clean, And dry.  Will contact rad onc, as they do know the history, to come by. Patient does not have non small cell lung ca, bony met is identical to thyroid mass. Lung mass has yet to be characterized.

## 2014-01-10 NOTE — Anesthesia Postprocedure Evaluation (Signed)
  Anesthesia Post-op Note  Patient: Sarah [name]  Procedure(s) Performed: Procedure(s): TOTAL THYROIDECTOMY (N/A)  Patient Location: PACU and ICU  Anesthesia Type:General  Level of Consciousness: sedated  Airway and Oxygen Therapy: Patient remains intubated per anesthesia plan  Post-op Pain: mild  Post-op Assessment: Post-op Vital signs reviewed  Post-op Vital Signs: Reviewed  Last Vitals:  Filed Vitals:   01/10/14 0000  BP: 106/66  Pulse: 95  Temp:   Resp: 14    Complications: No apparent anesthesia complications

## 2014-01-10 NOTE — Progress Notes (Addendum)
200 cc of fentanyl and 30 cc of propofol wasted in sink with Lolita Lenz, RN. Marcelyn Ditty A  I witnessed the above with Hassell Halim, RN

## 2014-01-10 NOTE — Procedures (Signed)
Extubation Procedure Note  Patient Details:   Name: Sarah [name] DOB: [birth year] MRN: [id number]   Airway Documentation:     Evaluation  O2 sats: stable throughout Complications: No apparent complications Patient did tolerate procedure well. Bilateral Breath Sounds: Diminished Suctioning: Oral;Airway Yes Placed to 4l/min [place]  Revonda Standard 01/10/2014, 12:19 PM

## 2014-01-10 NOTE — Progress Notes (Signed)
Social Visit Note [place] [facility] Elberta Leatherwood, MD,MS, PGY-1  I have seen patient and discussed current ICU placement. I agree with the excellent care patient is receiving and want to thank them for their continued efforts in caring for this patient. We will be happy to take over her care once again when patient is deemed stable enough to be transferred from ICU.   Notice: We have made one addition to her medication regimen: Synthroid 137mcg IV has been started immediately due to discussions we have had w/ Dr. Buddy Duty earlier in her stay.   Again, thank you for the care provided.    Mc[place]ag, Marylynn Pearson, MD,MS, PGY-1 01/10/2014 12:10 PM

## 2014-01-10 NOTE — Addendum Note (Signed)
Addendum created 01/10/14 0101 by Finis Bud, MD   Modules edited: Clinical Notes   Clinical Notes:  File: [id number]

## 2014-01-11 DIAGNOSIS — J95821 Acute postprocedural respiratory failure: Secondary | ICD-10-CM

## 2014-01-11 DIAGNOSIS — I2699 Other pulmonary embolism without acute cor pulmonale: Secondary | ICD-10-CM

## 2014-01-11 DIAGNOSIS — C7951 Secondary malignant neoplasm of bone: Principal | ICD-10-CM

## 2014-01-11 DIAGNOSIS — D34 Benign neoplasm of thyroid gland: Secondary | ICD-10-CM

## 2014-01-11 DIAGNOSIS — N390 Urinary tract infection, site not specified: Secondary | ICD-10-CM

## 2014-01-11 LAB — BASIC METABOLIC PANEL
Anion gap: 12 (ref 5–15)
BUN: 14 mg/dL (ref 6–23)
CO2: 29 mEq/L (ref 19–32)
Calcium: 8.2 mg/dL — ABNORMAL LOW (ref 8.4–10.5)
Chloride: 99 mEq/L (ref 96–112)
Creatinine, Ser: 0.8 mg/dL (ref 0.50–1.10)
GFR calc Af Amer: >90 mL/min (ref >90)
GFR, EST NON AFRICAN AMERICAN: 86 mL/min — AB (ref >90)
Glucose, Bld: 119 mg/dL — ABNORMAL HIGH (ref 70–99)
POTASSIUM: 3.8 meq/L (ref 3.7–5.3)
Sodium: 140 mEq/L (ref 137–147)

## 2014-01-11 LAB — CBC
HEMATOCRIT: 23.1 % — AB (ref 36.0–46.0)
HEMOGLOBIN: 7.9 g/dL — AB (ref 12.0–15.0)
MCH: 30 pg (ref 26.0–34.0)
MCHC: 34.2 g/dL (ref 30.0–36.0)
MCV: 87.8 fL (ref 78.0–100.0)
Platelets: 164 10*3/uL (ref 150–400)
RBC: 2.63 MIL/uL — ABNORMAL LOW (ref 3.87–5.11)
RDW: 15.4 % (ref 11.5–15.5)
WBC: 4.3 10*3/uL (ref 4.0–10.5)

## 2014-01-11 LAB — GLUCOSE, CAPILLARY
Glucose-Capillary: 133 mg/dL — ABNORMAL HIGH (ref 70–99)
Glucose-Capillary: 105 mg/dL — ABNORMAL HIGH (ref 70–99)
Glucose-Capillary: 122 mg/dL — ABNORMAL HIGH (ref 70–99)
Glucose-Capillary: 160 mg/dL — ABNORMAL HIGH (ref 70–99)
Glucose-Capillary: 138 mg/dL — ABNORMAL HIGH (ref 70–99)
Glucose-Capillary: 163 mg/dL — ABNORMAL HIGH (ref 70–99)
Glucose-Capillary: 118 mg/dL — ABNORMAL HIGH (ref 70–99)

## 2014-01-11 LAB — HEPARIN INDUCED THROMBOCYTOPENIA PNL
Patient O.D.: 0.488
UFH High Dose UFH H: 0 % Release
UFH LOW DOSE 0.5 IU/ML: 0 %
UFH Low Dose 0.1 IU/mL: 0 % Release
UFH SRA Result: NEGATIVE

## 2014-01-11 LAB — MAGNESIUM: Magnesium: 1.8 mg/dL (ref 1.5–2.5)

## 2014-01-11 LAB — APTT
aPTT: 52 seconds — ABNORMAL HIGH (ref 24–37)
aPTT: 63 seconds — ABNORMAL HIGH (ref 24–37)

## 2014-01-11 LAB — PHOSPHORUS: PHOSPHORUS: 5.5 mg/dL — AB (ref 2.3–4.6)

## 2014-01-11 MED ORDER — ZOLPIDEM TARTRATE 5 MG PO TABS
5.0000 mg | ORAL_TABLET | Freq: Every evening | ORAL | Status: DC | PRN
Start: 2014-01-11 — End: 2014-01-19

## 2014-01-11 MED ORDER — ARGATROBAN 50 MG/50ML IV SOLN
0.7500 ug/kg/min | INTRAVENOUS | Status: DC
  Administered 2014-01-11 – 2014-01-13 (×5): 0.5 ug/kg/min via INTRAVENOUS
  Administered 2014-01-14 – 2014-01-15 (×3): 0.75 ug/kg/min via INTRAVENOUS
  Filled 2014-01-11 (×11): qty 50

## 2014-01-11 NOTE — Progress Notes (Addendum)
PULMONARY / CRITICAL CARE MEDICINE   Name: Sarah [name] MRN: [id number] DOB: Jul 31, 1965    ADMISSION DATE:  12/12/2013 CONSULTATION DATE:  01/09/2014  REFERRING MD :  DR. Nori Riis  CHIEF COMPLAINT:  Lower extremity weakness  INITIAL PRESENTATION: 48 year old female admitted 9/21 for profound lower extremity weakness/paralysis. She was found to have severe cord compression at T3 and a large thyroid mass. She has since undergone several neurosurgical procedures with minimal improvement. 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 10/19 she was taken to OR for total thyroidectomy. She remains on vent post op to ICU. PCCM to see for ICU care and vent management.  oted patien had difficult intubation preop and periop couse tough dissection of thyroid  STUDIES:  MRI brain/tspine/Lspine 9/23 > Mass at T3 with significant cord compression, Massive thyroid enlargement.  CT chest/abd 9/23 >  3cm superhilar mass, several small pulmonary nodules. Nonocclusive PE RML and RLL. Tubular cystic abnormalities of both adnexal regions  10/1 US neck > enlarged thyroid gland without focal lesion.   SIGNIFICANT EVENTS: - 9/22: Pt. Admitted with lower extremity weakness.  - 9/23: Pt. Found to have mass at T-3 level with spinal compression.  - 9/24: Surgery performed for decompression and biopsy overnight. Pt. Post-op and doing well. 3cm Suprahilar mass on CT Chest, PE also identified. ?Thyroid etiology, but much less likely for mets. Oncology consulted.  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 Oncology on board. Neurosurgery to comment on need for spinal stabilization surgery.  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  - 10/8: Underwent T1-T5 spinal stabilization surg.  - 10/10: Restarted heparin  - 10/13: Dr. Harlow Asa unable to perform surg as  inpatient; will seek other consultations  - 10/16: ENT to perform Thyroidectomy Monday 10/19  - 10/17: All cell lines (WBC, Hgb, Plts) continue to drop - 10/19: Thyroidectomy - to ICU on ventilator.  - 10/20 : Extubated  SUBJECTIVE:  Extubated successfully Weak voice  Denies pain  VITAL SIGNS: Temp:  [98 F (36.7 C)-100.4 F (38 C)] 99 F (37.2 C) (10/21 1156) Pulse Rate:  [89-104] 99 (10/21 1200) Resp:  [13-24] 24 (10/21 1200) BP: (99-145)/(62-84) 144/67 mmHg (10/21 1200) SpO2:  [96 %-100 %] 96 % (10/21 1200) HEMODYNAMICS:   VENTILATOR SETTINGS:   INTAKE / OUTPUT:  Intake/Output Summary (Last 24 hours) at 01/11/14 1313 Last data filed at 01/11/14 1223  Gross per 24 hour  Intake    650 ml  Output    940 ml  Net   -290 ml    PHYSICAL EXAMINATION: General:  Obese female, comfortable in bed Neuro:  Awake, answers questions, and following commands.  HEENT:  [place], Large neck., surgical incision across proximal chest, closed with staples, no bleeding. Drain in place. Cardiovascular:  Tachy, regular, no MRG Lungs:  Distant lung sounds, clear. Synchronous with vent.  Abdomen:  Obese, hypoactive bowel sounds Musculoskeletal:  No acute deformity Skin:  Surgical incision to proximal anterior chest  LABS:  CBC  Recent Labs Lab 01/09/14 2102 01/10/14 0400 01/11/14 0326  WBC 6.4 6.1 4.3  HGB 9.4* 8.7* 7.9*  HCT 28.4* 26.2* 23.1*  PLT 116* 124* 164   Coag's  Recent Labs Lab 01/07/14 1304 01/09/14 2102  APTT 163*  --   INR 1.08 1.05   BMET  Recent Labs Lab 01/07/14  1304 01/09/14 1742 01/09/14 2102 01/11/14 0326  NA 134* 132* 134* 140  K 3.8 3.8 4.2 3.8  CL 94*  --  95* 99  CO2 29  --  24 29  BUN 7  --  9 14  CREATININE 0.67  --  0.77 0.80  GLUCOSE 196* 205* 212* 119*   Electrolytes  Recent Labs Lab 01/10/14 1030 01/10/14 1830 01/11/14 0326  CALCIUM 8.3* 8.4 8.2*  MG  --   --  1.8  PHOS  --   --  5.5*   Sepsis Markers No results found for  this basename: LATICACIDVEN, PROCALCITON, O2SATVEN,  in the last 168 hours ABG  Recent Labs Lab 01/09/14 2023  PHART 7.380  PCO2ART 41.5  PO2ART 246.0*   Liver Enzymes  Recent Labs Lab 01/05/14 0715 01/07/14 1304 01/09/14 2102  AST 32 38* 39*  ALT 38* 54* 45*  ALKPHOS 273* 398* 329*  BILITOT 1.0 0.7 1.6*  ALBUMIN 1.7* 1.8* 2.3*   Cardiac Enzymes No results found for this basename: TROPONINI, PROBNP,  in the last 168 hours Glucose  Recent Labs Lab 01/10/14 1603 01/10/14 1921 01/10/14 2328 01/11/14 0336 01/11/14 0801 01/11/14 1153  GLUCAP 131* 130* 133* 105* 122* 160*    Imaging No results found.   ASSESSMENT / PLAN:  PULMONARY OETT 10/19 >>> A: Acute respiratory failure in post operative setting Difficult airway pre-op S/p thyroidectomy for Hurthle Cell - with tough dissection perioperatively R hilar mass without clear path dx at this time  P:   F/u CXR Pulm hygiene Will need to consider and discuss whether she will need  tissue dx of her R hilar mass or whether we will consider this to be a met  CARDIOVASCULAR RIJ CVL 10/9 >>> A:  Sinus tachycardia H/o HTN  P:  Continuous cardiac monitoring Holding losartan for now  RENAL A:   No acute issues  P:   Follow BMP Replace electrolytes as indicated  GASTROINTESTINAL A:   No acute issues  P:   SUP: IV protonix Holding miralax, zofran while NPO Restart diet soon  HEMATOLOGIC/OCOLOGIC A:   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 (s/p decompression, fixation, throidectomy) Pulmonary embolism Anemia, Thrombocytopenia, Concern for HITT  P:  restart argatroban on 10/21 Follow coags HIT panel pending Oncology following: therapy course awaiting Ong discussion  INFECTIOUS A:   Concern for UTI given appearance of urine Cipro 10/2 > 10/9 Cipro 10/20 >>  P:   Will start empiric cipro 10/20 Change foley catheter 10/21   ENDOCRINE A:  Thyroidectomy due to Hurthle cell  carcinoma Hyperglycemia Likely PCOS  P:   Synthroid 11mg IV Follow TSH CBG monitoring and SSI Hold lantus ENT following   NEUROLOGIC A:   Spinal cord compression T3 status/post laminectomy and t1-t5 fixation BLE paraparesis/sensory loss  P:   RASS goal: 0 Management per neurosurgery Holding diazepam Radiation oncology to see her to initiate therapy  FAMILY Updates: 10/19 and 10/20 Interdisciplinary Family Meeting:  Due by 01/17/14    The patient is critically ill with multiple organ systems failure and requires high complexity decision making for assessment and support, frequent evaluation and titration of therapies, application of advanced monitoring technologies and extensive interpretation of multiple databases.   Critical Care Time devoted to patient care services described in this note is 35 Minutes. This time reflects time of care of this signee Dr RAlfonso Patten BLamonte Sakai This critical care time does not reflect procedure time, or teaching time or supervisory time  of PA/NP/Med student/Med Resident etc but could involve care discussion time    Baltazar Apo, MD, PhD 01/11/2014, 1:13 PM [facility] [phone] or if no answer [phone]

## 2014-01-11 NOTE — Progress Notes (Signed)
Physical Therapy Treatment Patient Details Name: Sarah [name] MRN: [id number] DOB: 11/12/1965 Today's Date: 01/11/2014    History of Present Illness 48 y.o. female admitted to [facility] on 12/12/13 with progressive lower extremity weakness and mid to upper back pain.  Pt was found to have a mass at T3 with (+) spinal cord compression.  On 9/23 pt underwent surgery for decompression and biopsy.  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  Her thyroid is very enlarged.  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 Pt is NOW S/P T1-T5 postlat arthrodesis allograft, T1-T5 segmental pedicle screw fixation on 12/30/13.  S/p thyroidectomy 10/19    PT Comments    Pt benefits from encouragement and reassurance to maximize mobility and function. Pt able to sit EOB today with 2 person assist with lift to chair. Pt with improved balance but need to continue to work toward supervision level EOB. Pt educated for mobility and encouraged to assist nursing with all movement. No noted function of bil LE today. Pt speaking softly but clearly post-op.  Follow Up Recommendations  CIR     Equipment Recommendations       Recommendations for Other Services       Precautions / Restrictions Precautions Precautions: Fall;Back Precaution Comments: bil leg weakness, paraplegia    Mobility  Bed Mobility Overal bed mobility: Needs Assistance;+2 for physical assistance   Rolling: +2 for physical assistance;Max assist Sidelying to sit: +2 for physical assistance;Max assist   Sit to supine: +2 for physical assistance;Max assist   General bed mobility comments: 2 person assist with HOB 20degrees to roll, side to sit and return to supine to place lift pad for transfer to chair. 1 person assist to pivot legs to EOB but 2 person required to elevate trunk from surface. Pt benefits from education, encouragement and reassurance  throughout  Transfers Overall transfer level: Needs assistance               General transfer comment: maxi move from bed to recliner with 2 person assist for safety and manipulation of environment  Ambulation/Gait                 Stairs            Wheelchair Mobility    Modified Rankin (Stroke Patients Only)       Balance Overall balance assessment: Needs assistance   Sitting balance-Leahy Scale: Poor Sitting balance - Comments: Pt initially EOB with total assist for posterior left lean. With assist to square hips EOB, max cues pt able to sit EOB 5 min with progression to min assist for balance with cues and bil UE on thighs. Pt very fearful of falling EOB with encouragement throughout Postural control: Posterior lean                          Cognition Arousal/Alertness: Awake/alert Behavior During Therapy: Flat affect Overall Cognitive Status: Impaired/Different from baseline Area of Impairment: Orientation Orientation Level: Time                  Exercises      General Comments        Pertinent Vitals/Pain Pain Score: 3  Pain Location: incision Pain Descriptors / Indicators: Aching Pain Intervention(s): Repositioned    Home Living  Prior Function            PT Goals (current goals can now be found in the care plan section) Progress towards PT goals: Progressing toward goals    Frequency  Min 4X/week    PT Plan Frequency needs to be updated;Current plan remains appropriate    Co-evaluation             End of Session   Activity Tolerance: Patient tolerated treatment well Patient left: in chair;with call bell/phone within reach     Time: 5110-2111 PT Time Calculation (min): 29 min  Charges:  $Therapeutic Activity: 23-37 mins                    G Codes:      Melford Aase 01-22-2014, 10:34 AM Elwyn Reach, [place]

## 2014-01-11 NOTE — Progress Notes (Signed)
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 Tramadol Nausea And Vomiting    Patient Measurements: Height: 5' 11"  (180.3 cm) Weight: 313 lb 15 oz (142.4 kg) IBW/kg (Calculated) : 70.8   Vital Signs: Temp: 99 F (37.2 C) (10/21 1156) Temp Source: Oral (10/21 1156) BP: 116/67 mmHg (10/21 1300) Pulse Rate: 92 (10/21 1300)  Labs:  Recent Labs  01/09/14 0527  01/09/14 2102 01/10/14 0400 01/11/14 0326  HGB 8.6*  < > 9.4* 8.7* 7.9*  HCT 25.7*  < > 28.4* 26.2* 23.1*  PLT 96*  --  116* 124* 164  LABPROT  --   --  13.8  --   --   INR  --   --  1.05  --   --   HEPARINUNFRC <0.10*  --   --   --   --   CREATININE  --   --  0.77  --  0.80  < > = values in this interval not displayed.  Estimated Creatinine Clearance: 136.4 ml/min (by C-G formula based on Cr of 0.8).   Medications:  Scheduled:  . antiseptic oral rinse  7 mL Mouth Rinse QID  . ciprofloxacin  400 mg Intravenous Q12H  . insulin aspart  2-6 Units Subcutaneous 6 times per day  . levothyroxine  125 mcg Intravenous QAC breakfast  . pantoprazole (PROTONIX) IV  40 mg Intravenous QHS  . senna  1 tablet Oral BID  . sodium chloride  10-40 mL Intracatheter Q12H    Assessment: 48 yo female with RLL PE now s/p thyroidectomy (POD#2). 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 Platelets have recovered but pt remains anemic. To start argatroban until HIT panel returns. ALT/AST + Tbili elevated, alk phos elevated. If they worsen, may need to consider changing to bivalirudin. Will shoot for standard aPTT goal since pt is now several days post-op.   Goal of Therapy:  APTT= 50-90 seconds Monitor platelets by anticoagulation protocol: Yes   Plan:  1. Argatroban 0.27mg/kg/min 2. Check a 2 hour aPTT 3. Daily aPTT and CBC 4. F/u HIT panel 5. Monitor LFTs  RSalome Arnt PharmD, BCPS Pager # [phone]/21/2015  2:08 PM

## 2014-01-11 NOTE — Progress Notes (Signed)
Patient ID: Sarah [name], female   DOB: 10/27/1965, 48 y.o.   MRN: [id number] No new complaints. Voice sounds normal. She is a low but reluctant to use it but when coached, she has a good strong voice. Incision looks good. JP removed. Await pathology. Continue care.

## 2014-01-11 NOTE — Progress Notes (Signed)
[facility] Progress Note Patient Name: Sarah [name] DOB: 04-15-1965 MRN: [id number]   Date of Service  01/11/2014  HPI/Events of Note  Cant sleep  eICU Interventions  ambien     Intervention Category Intermediate Interventions: Other:  YACOUB,WESAM 01/11/2014, 11:06 PM

## 2014-01-11 NOTE — Evaluation (Signed)
Clinical/Bedside Swallow Evaluation Patient Details  Name: Sarah [name] MRN: [id number] Date of Birth: May 27, 1965  Today's Date: 01/11/2014 Time: 0850-0905 SLP Time Calculation (min): 15 min  Past Medical History:  Past Medical History  Diagnosis Date  . Hypertension   . DDD (degenerative disc disease), cervical   . DJD (degenerative joint disease)   . Obesity   . Chronic back pain    Past Surgical History:  Past Surgical History  Procedure Laterality Date  . Tonsillectomy    . Laminectomy N/A 12/14/2013    Procedure: THORACIC LAMINECTOMY FOR TUMOR THORACIC THREE;  Surgeon: Sarah Pall, MD;  Location: [place] [facility];  Service: Neurosurgery;  Laterality: N/A;  THORACIC LAMINECTOMY FOR TUMOR THORACIC THREE  . Posterior lumbar fusion 4 level N/A 12/30/2013    Procedure: Thoracic one-Thoracic five posterior thoracic fusion with pedicle screws;  Surgeon: Sarah Pall, MD;  Location: [place] [facility];  Service: Neurosurgery;  Laterality: N/A;  Thoracic one-Thoracic five posterior thoracic fusion with pedicle screws  . Thyroidectomy N/A 01/09/2014    Procedure: TOTAL THYROIDECTOMY;  Surgeon: Sarah Gala, MD;  Location: [place];  Service: ENT;  Laterality: N/A;   HPI:  48 year old female admitted 9/21 for profound lower extremity weakness/paralysis. She was found to have severe cord compression at T3 and a large thyroid mass. She has since undergone several neurosurgical procedures with minimal improvement. 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 10/19 she was taken to OR for total thyroidectomy.  Noted patient had difficult intubation preop and periop course tough dissection of thyroid .  Per Dr. Constance [name], he is anticipating good vocal cord mobility but there is always a chance of unilateral or bilateral paresis or paralysis.  Intubated 10/19-20.  Orders for swallow eval.    Assessment / Plan /  Recommendation Clinical Impression  Swallow eval ordered per PCCM - Pt presents with clear quality phonation with low intensity; appears to protect her airway during consumption of thin liquids and purees.  She has mild signs of weakness with potential for pharyngeal residue (likely transient impact of surgery on submental muscles) - reports solids "not going down as well as liquids," however, swallow appears functional.  Pt prefers to initiate full liquid diet for now.  Will follow briefly for toleration.         Aspiration Risk  Mild    Diet Recommendation  (full liquid diet)   Liquid Administration via: Straw Medication Administration: Whole meds with puree Supervision: Patient able to self feed Compensations: Small sips/bites Postural Changes and/or Swallow Maneuvers: Seated upright 90 degrees    Other  Recommendations Oral Care Recommendations: Oral care BID   Follow Up Recommendations  None    Frequency and Duration min 1 x/week  1 week    Swallow Study Prior Functional Status       General Date of Onset: 01/09/14 Previous Swallow Assessment: none per records Diet Prior to this Study: NPO Temperature Spikes Noted: Yes Respiratory Status: Room air History of Recent Intubation: Yes Length of Intubations (days): 1 days Date extubated: 01/10/14 Behavior/Cognition: Alert;Cooperative Oral Cavity - Dentition: Missing dentition Self-Feeding Abilities: Able to feed self Patient Positioning: Upright in chair Baseline Vocal Quality: Clear;Low vocal intensity Volitional Cough: Weak Volitional Swallow: Able to elicit    Oral/Motor/Sensory Function Overall Oral Motor/Sensory Function: Appears within functional limits for tasks assessed   Ice Chips Ice chips: Within functional limits  Thin Liquid Thin Liquid: Within functional limits Presentation: Self Fed;Straw    Nectar Thick Nectar Thick Liquid: Not tested   Honey Thick Honey Thick Liquid: Not tested   Puree Puree:  Impaired Presentation: Self Fed Pharyngeal Phase Impairments: Multiple swallows -  Solid  Sarah [name] L. Sarah [name], [place] CCC/SLP Pager [phone]     Solid: Not tested       Sarah [name] 01/11/2014,9:15 AM

## 2014-01-11 NOTE — Progress Notes (Signed)
Social Visit Note [facility] [facility] Elberta Leatherwood, MD,MS, PGY-1  I have seen patient and discussed current ICU placement. I agree with the excellent care patient is receiving and want to thank them for their continued efforts in caring for this patient. We will be happy to take over her care when patient is deemed stable enough to be transferred from ICU.  Notes: patient extubated yesterday; tolerating well.  Again, thank you for the care provided.    McKeag, Marylynn Pearson, MD,MS, PGY-1 01/11/2014 9:40 AM

## 2014-01-11 NOTE — Progress Notes (Addendum)
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 Tramadol Nausea And Vomiting    Patient Measurements: Height: 5' 11" (180.3 cm) Weight: 313 lb 15 oz (142.4 kg) IBW/kg (Calculated) : 70.8   Vital Signs: Temp: 98.9 F (37.2 C) (10/21 1625) Temp Source: Oral (10/21 1625) BP: 111/71 mmHg (10/21 1500) Pulse Rate: 87 (10/21 1500)  Labs:  Recent Labs  01/09/14 0527  01/09/14 2102 01/10/14 0400 01/11/14 0326 01/11/14 1642  HGB 8.6*  < > 9.4* 8.7* 7.9*  --   HCT 25.7*  < > 28.4* 26.2* 23.1*  --   PLT 96*  --  116* 124* 164  --   APTT  --   --   --   --   --  52*  LABPROT  --   --  13.8  --   --   --   INR  --   --  1.05  --   --   --   HEPARINUNFRC <0.10*  --   --   --   --   --   CREATININE  --   --  0.77  --  0.80  --   < > = values in this interval not displayed.  Estimated Creatinine Clearance: 136.4 ml/min (by C-G formula based on Cr of 0.8).   Medications:  Scheduled:  . antiseptic oral rinse  7 mL Mouth Rinse QID  . ciprofloxacin  400 mg Intravenous Q12H  . insulin aspart  2-6 Units Subcutaneous 6 times per day  . levothyroxine  125 mcg Intravenous QAC breakfast  . pantoprazole (PROTONIX) IV  40 mg Intravenous QHS  . senna  1 tablet Oral BID  . sodium chloride  10-40 mL Intracatheter Q12H    Assessment: 48 yo female with RLL PE now s/p thyroidectomy (POD#2). 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 Platelets have recovered but pt remains anemic. To start argatroban until HIT panel returns. ALT/AST + Tbili elevated, alk phos elevated. If they worsen, may need to consider changing to bivalirudin. Will shoot for standard aPTT goal since pt is now several days post-op.    Initial aPTT is therapeutic at 52 on Argatroban 0.5 mcg/kg/min.  Goal of Therapy:  APTT = 50-90 seconds Monitor platelets by anticoagulation protocol: Yes   Plan:  1. Continue  argatroban 0.5mcg/kg/min 2. Check a 2 hour aPTT to confirm 3. Daily aPTT and CBC 4. F/u HIT panel 5. Monitor LFTs  Corey M. Ball, PharmD Clinical Pharmacist Pager [phone]  01/11/2014 5:34 PM   Addn: Confirmatory aPTT level checked and remained therapeutic at 63. Will recheck with AM labs.  Corey M. Ball, PharmD Clinical Pharmacist Pager [phone] 

## 2014-01-12 LAB — HEPARIN INDUCED THROMBOCYTOPENIA PNL
Patient O.D.: 0.371
UFH HIGH DOSE UFH H: 0 %
UFH LOW DOSE 0.5 IU/ML: 8 %
UFH Low Dose 0.1 IU/mL: 1 % Release
UFH SRA Result: NEGATIVE

## 2014-01-12 LAB — BASIC METABOLIC PANEL
Anion gap: 13 (ref 5–15)
BUN: 15 mg/dL (ref 6–23)
CHLORIDE: 98 meq/L (ref 96–112)
CO2: 27 meq/L (ref 19–32)
Calcium: 8.2 mg/dL — ABNORMAL LOW (ref 8.4–10.5)
Creatinine, Ser: 0.69 mg/dL (ref 0.50–1.10)
GFR calc Af Amer: >90 mL/min (ref >90)
GFR calc non Af Amer: >90 mL/min (ref >90)
Glucose, Bld: 108 mg/dL — ABNORMAL HIGH (ref 70–99)
Potassium: 3.6 mEq/L — ABNORMAL LOW (ref 3.7–5.3)
Sodium: 138 mEq/L (ref 137–147)

## 2014-01-12 LAB — GLUCOSE, CAPILLARY
GLUCOSE-CAPILLARY: 118 mg/dL — AB (ref 70–99)
GLUCOSE-CAPILLARY: 121 mg/dL — AB (ref 70–99)
GLUCOSE-CAPILLARY: 126 mg/dL — AB (ref 70–99)
Glucose-Capillary: 167 mg/dL — ABNORMAL HIGH (ref 70–99)
Glucose-Capillary: 126 mg/dL — ABNORMAL HIGH (ref 70–99)

## 2014-01-12 LAB — CBC
HCT: 23.4 % — ABNORMAL LOW (ref 36.0–46.0)
Hemoglobin: 7.9 g/dL — ABNORMAL LOW (ref 12.0–15.0)
MCH: 29 pg (ref 26.0–34.0)
MCHC: 33.8 g/dL (ref 30.0–36.0)
MCV: 86 fL (ref 78.0–100.0)
Platelets: 194 10*3/uL (ref 150–400)
RBC: 2.72 MIL/uL — AB (ref 3.87–5.11)
RDW: 15.1 % (ref 11.5–15.5)
WBC: 3.7 10*3/uL — ABNORMAL LOW (ref 4.0–10.5)

## 2014-01-12 LAB — APTT: aPTT: 63 seconds — ABNORMAL HIGH (ref 24–37)

## 2014-01-12 LAB — MAGNESIUM: Magnesium: 1.8 mg/dL (ref 1.5–2.5)

## 2014-01-12 LAB — PHOSPHORUS: Phosphorus: 4.9 mg/dL — ABNORMAL HIGH (ref 2.3–4.6)

## 2014-01-12 MED ORDER — INSULIN ASPART 100 UNIT/ML ~~LOC~~ SOLN
0.0000 [IU] | SUBCUTANEOUS | Status: DC
  Administered 2014-01-12: 3 [IU] via SUBCUTANEOUS
  Administered 2014-01-12: 4 [IU] via SUBCUTANEOUS
  Administered 2014-01-12 – 2014-01-13 (×3): 3 [IU] via SUBCUTANEOUS
  Administered 2014-01-13: 7 [IU] via SUBCUTANEOUS

## 2014-01-12 NOTE — Progress Notes (Signed)
Physical Therapy Treatment Patient Details Name: Sarah [name] MRN: [id number] DOB: [birth year] Today's Date: 01/12/2014    History of Present Illness 48 y.o. female admitted to [facility] on 12/12/13 with progressive lower extremity weakness and mid to upper back pain.  Pt was found to have a mass at T3 with (+) spinal cord compression.  On 9/23 pt underwent surgery for decompression and biopsy.  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  Her thyroid is very enlarged.  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 Pt is NOW S/P T1-T5 postlat arthrodesis allograft, T1-T5 segmental pedicle screw fixation on 12/30/13.  S/p thyroidectomy 10/19, extubated 10/20.    PT Comments    Pt with improved sitting tolerance today, able to sit 10mins at EOB with fluctuating levels of A.  Pt able to support self with Bil UE support for <46min each time 2/2 fatigue.  Pt incontinent of bowels during session and required total A for hygiene.  Will continue to follow.    Follow Up Recommendations  CIR     Equipment Recommendations  Wheelchair (measurements PT);Wheelchair cushion (measurements PT);Hospital bed;3in1 (PT)    Recommendations for Other Services Rehab consult     Precautions / Restrictions Precautions Precautions: Fall;Back Precaution Comments: bil leg weakness, paraplegia Restrictions Weight Bearing Restrictions: No    Mobility  Bed Mobility Overal bed mobility: Needs Assistance;+2 for physical assistance Bed Mobility: Rolling;Sidelying to Sit;Sit to Sidelying Rolling: Mod assist Sidelying to sit: Max assist;+2 for physical assistance Supine to sit: +2 for physical assistance;Max assist Sit to supine: +2 for physical assistance;Total assist Sit to sidelying: Total assist;+2 for physical assistance General bed mobility comments: assisted to flex knee and guide UE to rail for rolling, assisted for LEs and trunk with  supine to sit  Transfers                    Ambulation/Gait                 Stairs            Wheelchair Mobility    Modified Rankin (Stroke Patients Only)       Balance Overall balance assessment: Needs assistance Sitting-balance support: Bilateral upper extremity supported;Feet supported Sitting balance-Leahy Scale: Poor Sitting balance - Comments: Pt sat EOB x 15 minutes with UEs on rails.  Tolerated weight shifting all directions to challenge her balance.  UEs fatigue very quickly when pt is unsupported. Postural control: Posterior lean                          Cognition Arousal/Alertness: Awake/alert Behavior During Therapy: Flat affect Overall Cognitive Status: Within Functional Limits for tasks assessed                      Exercises      General Comments        Pertinent Vitals/Pain Pain Assessment: Faces Faces Pain Scale: Hurts a little bit Pain Location: Back with bed mobility Pain Descriptors / Indicators: Aching Pain Intervention(s): Monitored during session;Repositioned    Home Living                      Prior Function            PT Goals (current goals can now be found in the care plan section)  Acute Rehab PT Goals Patient Stated Goal: to go home PT Goal Formulation: With patient Time For Goal Achievement: 01/20/14 Potential to Achieve Goals: Fair Progress towards PT goals: Progressing toward goals    Frequency  Min 4X/week    PT Plan Current plan remains appropriate    Co-evaluation PT/OT/SLP Co-Evaluation/Treatment: Yes Reason for Co-Treatment: For patient/therapist safety;Complexity of the patient's impairments (multi-system involvement) PT goals addressed during session: Mobility/safety with mobility;Balance;Strengthening/ROM OT goals addressed during session: ADL's and self-care     End of Session   Activity Tolerance: Patient limited by fatigue Patient left: in bed;with call  bell/phone within reach     Time: 0902-0938 PT Time Calculation (min): 36 min  Charges:  $Therapeutic Activity: 8-22 mins                    G CodesCatarina Hartshorn, [place] 01/12/2014, 1:22 PM

## 2014-01-12 NOTE — Progress Notes (Signed)
Speech Language Pathology Treatment: Dysphagia  Patient Details Name: Sarah [name] MRN: [id number] DOB: 06/27/1965 Today's Date: 01/12/2014 Time: 2334-3568 SLP Time Calculation (min): 16 min  Assessment / Plan / Recommendation Clinical Impression  Pt was seen for f/u to assess diet tolerance. Note that order in chart has been advanced to regular textures, still with thin liquids. Per RN who supervised lunch meal, pt consumed regular texture tray with no overt difficulty and no subjective complaints. SLP observed patient with PO trials of regular solids and thin liquids via straw, which she consumed with Mod I with no overt signs of aspiration. Vocal quality is clear and with adequate intensity today. Pt remains without subjective complaints, and reports that her swallowing function feels like it has returned to baseline. Given the above, recommend to continue with regular textures and thin liquids without continued SLP f/u for swallow.   HPI HPI: 48 year old female admitted 9/21 for profound lower extremity weakness/paralysis. She was found to have severe cord compression at T3 and a large thyroid mass. She has since undergone several neurosurgical procedures with minimal improvement. 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 10/19 she was taken to OR for total thyroidectomy.  Noted patient had difficult intubation preop and periop course tough dissection of thyroid .  Per Dr. Constance Holster, he is anticipating good vocal cord mobility but there is always a chance of unilateral or bilateral paresis or paralysis.  Intubated 10/19-20.  Orders for swallow eval.    Pertinent Vitals Pain Assessment: Faces Faces Pain Scale: Hurts a little bit Pain Location: back with bed mobility Pain Descriptors / Indicators: Aching Pain Intervention(s): Patient requesting pain meds-RN notified;Other (comment) (RN present and  providing options for pain meds)  SLP Plan  All goals met    Recommendations Diet recommendations: Regular;Thin liquid Liquids provided via: Straw;Cup Medication Administration: Whole meds with liquid Supervision: Patient able to self feed Compensations: Small sips/bites Postural Changes and/or Swallow Maneuvers: Seated upright 90 degrees              Oral Care Recommendations: Oral care BID Follow up Recommendations: None Plan: All goals met    GO      Germain Osgood, M.A. CCC-SLP [phone]  Germain Osgood 01/12/2014, 2:48 PM

## 2014-01-12 NOTE — Progress Notes (Signed)
PULMONARY / CRITICAL CARE MEDICINE   Name: Sarah [name] MRN: [id number] DOB: [birth year]    ADMISSION DATE:  12/12/2013 CONSULTATION DATE:  01/09/2014  REFERRING MD :  DR. Nori Riis  CHIEF COMPLAINT:  Lower extremity weakness  INITIAL PRESENTATION: 48 year old female admitted 9/21 for profound lower extremity weakness/paralysis. She was found to have severe cord compression at T3 and a large thyroid mass. She has since undergone several neurosurgical procedures with minimal improvement. 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 10/19 she was taken to OR for total thyroidectomy. She remains on vent post op to ICU. PCCM to see for ICU care and vent management.  oted patien had difficult intubation preop and periop couse tough dissection of thyroid  STUDIES:  9/23  MRI brain/tspine/Lspine >> Mass at T3 with significant cord compression, Massive thyroid enlargement.  9/23  CT chest/abd >>  3cm superhilar mass, several small pulmonary nodules. Nonocclusive PE RML and RLL. Tubular cystic abnormalities of both adnexal regions  10/1 US neck >> enlarged thyroid gland without focal lesion.   SIGNIFICANT EVENTS: 9/22  Pt. Admitted with lower extremity weakness.  9/23  Pt. Found to have mass at T-3 level with spinal compression.  9/24  Surgery performed for decompression and biopsy overnight. Pt. Post-op and doing well. 3cm Suprahilar mass on CT Chest, PE also identified. ?Thyroid etiology, but much less likely for mets. Oncology consulted.  9/30  Path returns with metastatic adenocarcinoma likely lung primary given Nodule on chest CT. Oncology on board. Neurosurgery to comment on need for spinal stabilization surgery.  10/02  Thyroid Bx -suggestive of Hurthle Cell Carcinoma 10/7.  10/08  Underwent T1-T5 spinal stabilization surg.  10/10  Restarted heparin  10/13  Dr. Harlow Asa unable to perform surg as  inpatient; will seek other consultations  10/16  ENT to perform Thyroidectomy Monday 10/19  10/17  All cell lines (WBC, Hgb, Plts) continue to drop 10/19  Thyroidectomy - to ICU on ventilator.  10/20  Extubated  SUBJECTIVE: Pt denies acute complaints.  Reports some sensation in LE's.    VITAL SIGNS: Temp:  [98.5 F (36.9 C)-99 F (37.2 C)] 98.5 F (36.9 C) (10/22 0733) Pulse Rate:  [85-99] 85 (10/22 0733) Resp:  [0-38] 18 (10/22 0733) BP: (110-145)/(59-81) 145/75 mmHg (10/22 0733) SpO2:  [94 %-100 %] 97 % (10/22 0733)  INTAKE / OUTPUT:  Intake/Output Summary (Last 24 hours) at 01/12/14 1154 Last data filed at 01/12/14 1054  Gross per 24 hour  Intake  669.5 ml  Output    915 ml  Net -245.5 ml    PHYSICAL EXAMINATION: General:  Obese female, comfortable in bed Neuro:  Awake, answers questions, and following commands.  HEENT:  [place], Large neck., surgical incision across proximal chest, closed with staples, no bleeding. Drain in place. Voice strong / clear.   Cardiovascular:  s1s2 rrr, no MRG Lungs:  Resp's even/non-labored, lungs bilaterally clear  Abdomen:  Obese, BSx4 active Musculoskeletal:  No acute deformity Skin:  Surgical incision to proximal anterior chest  LABS:  CBC  Recent Labs Lab 01/10/14 0400 01/11/14 0326 01/12/14 0505  WBC 6.1 4.3 3.7*  HGB 8.7* 7.9* 7.9*  HCT 26.2* 23.1* 23.4*  PLT 124* 164 194   Coag's  Recent Labs Lab 01/07/14 1304 01/09/14 2102 01/11/14 1642 01/11/14 1900 01/12/14 0505  APTT 163*  --  52* 63* 63*  INR 1.08 1.05  --   --   --  BMET  Recent Labs Lab 01/09/14 2102 01/11/14 0326 01/12/14 0505  NA 134* 140 138  K 4.2 3.8 3.6*  CL 95* 99 98  CO2 24 29 27   BUN 9 14 15   CREATININE 0.77 0.80 0.69  GLUCOSE 212* 119* 108*   Electrolytes  Recent Labs Lab 01/10/14 1830 01/11/14 0326 01/12/14 0505  CALCIUM 8.4 8.2* 8.2*  MG  --  1.8 1.8  PHOS  --  5.5* 4.9*   Glucose  Recent Labs Lab 01/11/14 1153  01/11/14 1622 01/11/14 2039 01/11/14 2319 01/12/14 0515 01/12/14 0734  GLUCAP 160* 138* 163* 118* 118* 121*    Imaging No results found.   ASSESSMENT / PLAN:  PULMONARY OETT 10/19 >>> A: Acute Respiratory Failure - in post operative setting Difficult airway pre-op S/p thyroidectomy for Hurthle Cell - with tough dissection perioperatively R hilar mass without clear path dx at this time  P:   F/u CXR Pulm hygiene, Mobilize as able Will need ONC input on pulmonary nodule and whether or not a tissue sampling of R hilar mass is needed or if we should consider this a metastasis.  If tissue sampling wanted, she will need an EBUS.  At this time, it is likely best we allow her to recover from her acute illness before we pose another procedure.  She can follow up with Dr. Lamonte Sakai in 1-2 mo's as an outpatient (not arranged due to pending dispo timing).   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  Plan is to transition to Xarelto once all her surgeries are complete (PER Heme, see 10/19 note) Follow coags HIT panel negative (SRA) Oncology following: feel this is likely stage 4 lung adenocarcinoma.  Patient expresses "she wants to fight" & willing for chemo.   Will need Rad ONC involvement as well.   INFECTIOUS A:   Concern for UTI given appearance of urine Cipro 10/2 > 10/9 Cipro 10/20 >>   UA 10/19 >> lg  leuks, nitrite pos, many bacteria (21-50) UC 10/20 >> 100k GNR >>   P:   Cipro initiated 10/20, await final culture Change foley catheter 10/21   ENDOCRINE A:  Thyroidectomy due to Hurthle cell carcinoma Hyperglycemia Likely PCOS  P:   Synthroid 173mcg IV Follow TSH CBG monitoring and SSI Hold lantus ENT following   NEUROLOGIC A:   Spinal cord compression T3 status/post laminectomy and t1-t5 fixation BLE paraparesis/sensory loss  P:   RASS goal: 0 Management per neurosurgery Holding diazepam Radiation oncology to see her to initiate therapy  GLOBAL: Family Updates: 10/19 and 10/20 Interdisciplinary Family Meeting:  Due by 01/17/14  Will need SNF vs CIR.  Place CIR consult 10/22  PCCM will transition primary SVC back to [facility] as of 10/22.  PCCM will be available PRN.     Noe Gens, NP-C [facility] Pgr: [phone] or [phone]   Attending:  I have seen and  examined the patient with nurse practitioner/resident and agree with the note above.   We can be available as needed for further in hospital  Roselie Awkward, MD [place] [facility] Pager: [phone] Cell: [phone] If no response, call [phone]

## 2014-01-12 NOTE — Progress Notes (Signed)
Pt transferred to [facility]. Pt placed on monitor. Receiving nurse at bedside.  Vella Raring, RN

## 2014-01-12 NOTE — Progress Notes (Signed)
ANTICOAGULATION CONSULT NOTE - Follow Up Consult  Pharmacy Consult for argatroban Indication: PE   Allergies  Allergen Reactions  . Tramadol Nausea And Vomiting    Patient Measurements: Height: 5' 11"  (180.3 cm) Weight: 313 lb 15 oz (142.4 kg) IBW/kg (Calculated) : 70.8   Vital Signs: Temp: 98.5 F (36.9 C) (10/22 0733) Temp Source: Oral (10/22 0733) BP: 145/75 mmHg (10/22 0733) Pulse Rate: 85 (10/22 0733)  Labs:  Recent Labs  01/09/14 2102 01/10/14 0400 01/11/14 0326 01/11/14 1642 01/11/14 1900 01/12/14 0505  HGB 9.4* 8.7* 7.9*  --   --  7.9*  HCT 28.4* 26.2* 23.1*  --   --  23.4*  PLT 116* 124* 164  --   --  194  APTT  --   --   --  52* 63* 63*  LABPROT 13.8  --   --   --   --   --   INR 1.05  --   --   --   --   --   CREATININE 0.77  --  0.80  --   --  0.69    Estimated Creatinine Clearance: 136.4 ml/min (by C-G formula based on Cr of 0.69).   Medications:  Scheduled:  . antiseptic oral rinse  7 mL Mouth Rinse QID  . ciprofloxacin  400 mg Intravenous Q12H  . insulin aspart  0-20 Units Subcutaneous 6 times per day  . levothyroxine  125 mcg Intravenous QAC breakfast  . pantoprazole (PROTONIX) IV  40 mg Intravenous QHS  . senna  1 tablet Oral BID  . sodium chloride  10-40 mL Intracatheter Q12H    Assessment: 48 yo female with RLL PE now s/p thyroidectomy (POD#3). 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 Platelets have recovered but pt remains anemic. APTT is therapeutic at 63. ALT/AST + Tbili elevated, alk phos elevated. If they worsen, may need to consider changing to bivalirudin.   Goal of Therapy:  APTT= 50-90 seconds Monitor platelets by anticoagulation protocol: Yes   Plan:  1. Continue Argatroban 0.15mg/kg/min 2. Daily aPTT and CBC 3. Monitor LFTs 4. F/u plan for oral anticoagulation  RSalome Arnt PharmD, BCPS Pager # [phone]/22/2015 8:58 AM

## 2014-01-12 NOTE — Progress Notes (Signed)
Pt received from 2S ICU, pt is alert and orientated, no skin issues noted on pts sacrum. Pts FSBS 118, vital signs stable. Pt not complaining of pain. Pt has open surgical site with staples from Thyroidectomy and covered wound from removal of JP drain.

## 2014-01-12 NOTE — Progress Notes (Signed)
Occupational Therapy Treatment Patient Details Name: Sarah [name] MRN: [id number] DOB: 06/15/1965 Today's Date: 01/12/2014    History of present illness 48 y.o. female admitted to [facility] on 12/12/13 with progressive lower extremity weakness and mid to upper back pain.  Pt was found to have a mass at T3 with (+) spinal cord compression.  On 9/23 pt underwent surgery for decompression and biopsy.  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  Her thyroid is very enlarged.  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 Pt is NOW S/P T1-T5 postlat arthrodesis allograft, T1-T5 segmental pedicle screw fixation on 12/30/13.  S/p thyroidectomy 10/19, extubated 10/20.   OT comments  Good tolerance of bed mobility and sitting EOB.  Sat x 15 minutes with encouragement to support herself with rails and some minimal displacement with pt recovering to midline.  Improved ability to assist with rolling for pericare and change of bedding with use of rail. UEs fatigue very quickly.  Follow Up Recommendations  CIR;Supervision/Assistance - 24 hour    Equipment Recommendations       Recommendations for Other Services      Precautions / Restrictions Precautions Precautions: Fall;Back Precaution Comments: bil leg weakness, paraplegia Restrictions Weight Bearing Restrictions: No       Mobility Bed Mobility Overal bed mobility: Needs Assistance;+2 for physical assistance Bed Mobility: Rolling;Supine to Sit;Sit to Supine Rolling: Mod assist   Supine to sit: +2 for physical assistance;Max assist Sit to supine: +2 for physical assistance;Total assist   General bed mobility comments: assisted to flex knee and guide UE to rail for rolling, assisted for LEs and trunk with supine to sit  Transfers                      Balance Overall balance assessment: Needs assistance Sitting-balance support: Bilateral upper extremity  supported Sitting balance-Leahy Scale: Poor Sitting balance - Comments: Pt sat EOB x 15 minutes with UEs on rails.  Tolerated weight shifting all directions to challenge her balance.  UEs fatigue very quickly when pt is unsupported. Postural control: Posterior lean                         ADL Overall ADL's : Needs assistance/impaired     Grooming: Wash/dry face;Bed level;Set up                                 General ADL Comments: Pt with bowel incontinence without awareness, total assist to clean periarea and change pad.      Vision                     Perception     Praxis      Cognition   Behavior During Therapy: Flat affect Overall Cognitive Status: Within Functional Limits for tasks assessed                       Extremity/Trunk Assessment               Exercises     Shoulder Instructions       General Comments      Pertinent Vitals/ Pain       Pain Assessment: Faces Faces Pain Scale: Hurts a little bit Pain Location: back with  bed mobility Pain Descriptors / Indicators: Aching Pain Intervention(s): Monitored during session;Repositioned  Home Living                                          Prior Functioning/Environment              Frequency Min 2X/week     Progress Toward Goals  OT Goals(current goals can now be found in the care plan section)  Progress towards OT goals: Progressing toward goals  Acute Rehab OT Goals Patient Stated Goal: to go home Time For Goal Achievement: 01/15/14 Potential to Achieve Goals: Good  Plan Discharge plan remains appropriate    Co-evaluation    PT/OT/SLP Co-Evaluation/Treatment: Yes Reason for Co-Treatment: For patient/therapist safety;Complexity of the patient's impairments (multi-system involvement)   OT goals addressed during session: ADL's and self-care      End of Session     Activity Tolerance Patient limited by fatigue    Patient Left in bed;with call bell/phone within reach (MD in room)   Nurse Communication          Time: 7290-2111 OT Time Calculation (min): 36 min  Charges: OT General Charges $OT Visit: 1 Procedure OT Treatments $Therapeutic Activity: 8-22 mins  Malka So 01/12/2014, 10:18 AM

## 2014-01-13 ENCOUNTER — Encounter: Payer: Self-pay | Admitting: Radiation Oncology

## 2014-01-13 LAB — TYPE AND SCREEN
ABO/RH(D): O POS
Antibody Screen: NEGATIVE
Unit division: 0
Unit division: 0

## 2014-01-13 LAB — CBC
HEMATOCRIT: 24.9 % — AB (ref 36.0–46.0)
Hemoglobin: 8.2 g/dL — ABNORMAL LOW (ref 12.0–15.0)
MCH: 28.2 pg (ref 26.0–34.0)
MCHC: 32.9 g/dL (ref 30.0–36.0)
MCV: 85.6 fL (ref 78.0–100.0)
PLATELETS: 236 10*3/uL (ref 150–400)
RBC: 2.91 MIL/uL — ABNORMAL LOW (ref 3.87–5.11)
RDW: 14.8 % (ref 11.5–15.5)
WBC: 3.7 10*3/uL — ABNORMAL LOW (ref 4.0–10.5)

## 2014-01-13 LAB — BASIC METABOLIC PANEL
Anion gap: 13 (ref 5–15)
BUN: 9 mg/dL (ref 6–23)
CO2: 28 mEq/L (ref 19–32)
CREATININE: 0.69 mg/dL (ref 0.50–1.10)
Calcium: 8 mg/dL — ABNORMAL LOW (ref 8.4–10.5)
Chloride: 101 mEq/L (ref 96–112)
GFR calc non Af Amer: >90 mL/min (ref >90)
GLUCOSE: 109 mg/dL — AB (ref 70–99)
Potassium: 3.7 mEq/L (ref 3.7–5.3)
Sodium: 142 mEq/L (ref 137–147)

## 2014-01-13 LAB — GLUCOSE, CAPILLARY
GLUCOSE-CAPILLARY: 96 mg/dL (ref 70–99)
GLUCOSE-CAPILLARY: 122 mg/dL — AB (ref 70–99)
GLUCOSE-CAPILLARY: 104 mg/dL — AB (ref 70–99)
Glucose-Capillary: 94 mg/dL (ref 70–99)
Glucose-Capillary: 105 mg/dL — ABNORMAL HIGH (ref 70–99)
Glucose-Capillary: 205 mg/dL — ABNORMAL HIGH (ref 70–99)

## 2014-01-13 LAB — URINE CULTURE: SPECIAL REQUESTS: NORMAL

## 2014-01-13 LAB — APTT: aPTT: 58 seconds — ABNORMAL HIGH (ref 24–37)

## 2014-01-13 MED ORDER — CEPHALEXIN 500 MG PO CAPS
500.0000 mg | ORAL_CAPSULE | Freq: Four times a day (QID) | ORAL | Status: DC
  Administered 2014-01-13 (×2): 500 mg via ORAL
  Filled 2014-01-13 (×4): qty 1

## 2014-01-13 MED ORDER — INSULIN ASPART 100 UNIT/ML ~~LOC~~ SOLN: 0.0000 [IU] | Freq: Every day | SUBCUTANEOUS | Status: DC

## 2014-01-13 MED ORDER — LORAZEPAM 0.5 MG PO TABS
0.5000 mg | ORAL_TABLET | Freq: Four times a day (QID) | ORAL | Status: DC | PRN
  Administered 2014-01-14: 0.5 mg via ORAL
  Filled 2014-01-13: qty 1

## 2014-01-13 MED ORDER — INSULIN ASPART 100 UNIT/ML ~~LOC~~ SOLN
0.0000 [IU] | Freq: Three times a day (TID) | SUBCUTANEOUS | Status: DC
  Administered 2014-01-14 (×2): 3 [IU] via SUBCUTANEOUS

## 2014-01-13 NOTE — Progress Notes (Signed)
Call placed to neurosurgery office regarding dressing change orders for back incision. Still has honeycomb dressing intact with occlusive dressing over that.

## 2014-01-13 NOTE — Progress Notes (Signed)
Patient ID: Sarah [name], female   DOB: [birth year], 48 y.o.   MRN: [id number] BP 122/69  Pulse 78  Temp(Src) 98.3 F (36.8 C) (Oral)  Resp 18  Ht 5\' 11"  (1.803 m)  Wt 142.4 kg (313 lb 15 oz)  BMI 43.80 kg/m2  SpO2 99%  LMP 11/28/2013 Alert and oriented x 4, speech is clear and fluent Moving upper extremities Slight movement lower extremity abductors Wound is clean, dry, without signs of infection.

## 2014-01-13 NOTE — Progress Notes (Deleted)
Pt to be discharged to [facility]. Report called in to nurse at facility.

## 2014-01-13 NOTE — Progress Notes (Signed)
ANTICOAGULATION CONSULT NOTE - Follow Up Consult  Pharmacy Consult for argatroban Indication: PE   Allergies  Allergen Reactions  . Tramadol Nausea And Vomiting    Patient Measurements: Height: _0  (180.3 cm) Weight: 313 lb 15 oz (142.4 kg) IBW/kg (Calculated) : 70.8   Vital Signs: Temp: 98 F (36.7 C) (10/23 0831) Temp Source: Oral (10/23 0831) BP: 131/64 mmHg (10/23 0831) Pulse Rate: 87 (10/23 0831)  Labs:  Recent Labs  01/11/14 0326  01/11/14 1900 01/12/14 0505 01/13/14 0855  HGB 7.9*  --   --  7.9* 8.2*  HCT 23.1*  --   --  23.4* 24.9*  PLT 164  --   --  194 236  APTT  --   < > 63* 63* 58*  CREATININE 0.80  --   --  0.69  --   < > = values in this interval not displayed.  Estimated Creatinine Clearance: 136.4 ml/min (by C-G formula based on Cr of 0.69).   Medications:  Scheduled:  . antiseptic oral rinse  7 mL Mouth Rinse QID  . ciprofloxacin  400 mg Intravenous Q12H  . insulin aspart  0-20 Units Subcutaneous 6 times per day  . levothyroxine  125 mcg Intravenous QAC breakfast  . senna  1 tablet Oral BID  . sodium chloride  10-40 mL Intracatheter Q12H    Assessment: 48 yo female with RLL PE now s/p thyroidectomy (POD#4). 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 Platelets have recovered but pt remains anemic. APTT is therapeutic at 58. ALT/AST + Tbili elevated, alk phos elevated. If they worsen, may need to consider changing to bivalirudin.   Goal of Therapy:  APTT= 50-90 seconds Monitor platelets by anticoagulation protocol: Yes   Plan:  1. Continue Argatroban 0.78mg/kg/min 2. Daily aPTT and CBC 3. Monitor LFTs (check in AM) 4. F/u plan for oral anticoagulation  RSalome Arnt PharmD, BCPS Pager # [phone]/23/2015 9:39 AM

## 2014-01-13 NOTE — Progress Notes (Signed)
Occupational Therapy Treatment Patient Details Name: Sarah [name] MRN: [id number] DOB: 04/05/1965 Today's Date: 01/13/2014    History of present illness 48 y.o. female admitted to [facility] on 12/12/13 with progressive lower extremity weakness and mid to upper back pain.  Pt was found to have a mass at T3 with (+) spinal cord compression.  On 9/23 pt underwent surgery for decompression and biopsy.  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  Her thyroid is very enlarged.  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 Pt is NOW S/P T1-T5 postlat arthrodesis allograft, T1-T5 segmental pedicle screw fixation on 12/30/13.  S/p thyroidectomy 10/19, extubated 10/20.   OT comments  Focus of session on B UE exercise at bed level.  Tolerated well.  Pt to continue outside of therapy session. Pt wants to get to rehab as soon as possible. Highly motivated to gain more mobility and independence so she may return home.  Follow Up Recommendations  CIR;Supervision/Assistance - 24 hour    Equipment Recommendations       Recommendations for Other Services      Precautions / Restrictions Precautions Precautions: Fall;Back Precaution Comments: bil leg weakness, paraplegia Restrictions Weight Bearing Restrictions: No       Mobility Bed Mobility Rolling: Mod assist (used rail for MD to remove dressing)    General bed mobility comments: rolled to R with rail  Transfers                      Balance                         ADL                                                Vision                     Perception     Praxis      Cognition   Behavior During Therapy: [facility] for tasks assessed/performed Overall Cognitive Status: Within Functional Limits for tasks assessed                       Extremity/Trunk Assessment               Exercises General  Exercises - Upper Extremity Shoulder Flexion: Strengthening;Both;10 reps;Supine;Theraband Theraband Level (Shoulder Flexion): Level 1 (Yellow) Shoulder Extension: Strengthening;Both;10 reps;Supine;Theraband Theraband Level (Shoulder Extension): Level 1 (Yellow) Shoulder Horizontal ABduction: Strengthening;Both;10 reps;Supine;Theraband Theraband Level (Shoulder Horizontal Abduction): Level 1 (Yellow) Shoulder Horizontal ADduction: Strengthening;Both;10 reps;Supine;Theraband Theraband Level (Shoulder Horizontal Adduction): Level 1 (Yellow) Elbow Flexion: Strengthening;Both;10 reps;Supine;Theraband Theraband Level (Elbow Flexion): Level 1 (Yellow) Elbow Extension: Strengthening;Both;10 reps;Supine;Theraband Theraband Level (Elbow Extension): Level 1 (Yellow) Other Exercises Other Exercises: Dr Christella Noa in room and agreeable to pt participating in UE strengthening activities.   Shoulder Instructions       General Comments      Pertinent Vitals/ Pain       Pain Assessment: No/denies pain  Home Living  Prior Functioning/Environment              Frequency Min 2X/week     Progress Toward Goals  OT Goals(current goals can now be found in the care plan section)  Progress towards OT goals: Progressing toward goals  Acute Rehab OT Goals Patient Stated Goal: to go home  Plan Discharge plan remains appropriate    Co-evaluation                 End of Session     Activity Tolerance Patient tolerated treatment well   Patient Left in bed;with call bell/phone within reach;with family/visitor present   Nurse Communication          Time: 1210-1240 OT Time Calculation (min): 30 min  Charges: OT General Charges $OT Visit: 1 Procedure OT Treatments $Therapeutic Exercise: 23-37 mins  Malka So 01/13/2014, 1:12 PM [phone]

## 2014-01-13 NOTE — Progress Notes (Signed)
Physical Therapy Treatment Patient Details Name: Sarah [name] MRN: [id number] DOB: 1966-03-10 Today's Date: 01/13/2014    History of Present Illness 48 y.o. female admitted to [facility] on 12/12/13 with progressive lower extremity weakness and mid to upper back pain.  Pt was found to have a mass at T3 with (+) spinal cord compression.  On 9/23 pt underwent surgery for decompression and biopsy.  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  Her thyroid is very enlarged.  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 Pt is NOW S/P T1-T5 postlat arthrodesis allograft, T1-T5 segmental pedicle screw fixation on 12/30/13.  S/p thyroidectomy 10/19, extubated 10/20.    PT Comments    Pt very motivated today and able to increase time sitting EOB with only MinG support.  Pt able to demonstrate trace hamstring activation on R today during ROM.  Pt with BM while working with PT.  Pt performed rolling multiple times for peri hygiene.  Will continue to follow along.    Follow Up Recommendations  CIR     Equipment Recommendations  Wheelchair (measurements PT);Wheelchair cushion (measurements PT);Hospital bed;3in1 (PT)    Recommendations for Other Services Rehab consult     Precautions / Restrictions Precautions Precautions: Fall;Back Precaution Comments: bil leg weakness, paraplegia Restrictions Weight Bearing Restrictions: No    Mobility  Bed Mobility Overal bed mobility: Needs Assistance;+2 for physical assistance Bed Mobility: Rolling;Sidelying to Sit;Sit to Sidelying Rolling: Mod assist Sidelying to sit: Max assist;+2 for physical assistance     Sit to sidelying: Max assist;+2 for physical assistance General bed mobility comments: pt repeated rolling multiple times for peri care after returning to supine.  pt utilizes bed rails to A with bringing trunk up to sitting and attempts to help control descent to supine.     Transfers                    Ambulation/Gait                 Stairs            Wheelchair Mobility    Modified Rankin (Stroke Patients Only)       Balance Overall balance assessment: Needs assistance Sitting-balance support: Bilateral upper extremity supported;Feet supported Sitting balance-Leahy Scale: Poor Sitting balance - Comments: pt able to sit EOB ~46mins today with increased bouts of sitting with only MinG A.  pt attempts to maintain balance in midline, however increased effort to do so.                              Cognition Arousal/Alertness: Awake/alert Behavior During Therapy: WFL for tasks assessed/performed Overall Cognitive Status: Within Functional Limits for tasks assessed                      Exercises Other Exercises Other Exercises: PROM in sitting to Bil LEs.  pt does demonstrate trace R hamstring activation.  pt with continued flexor tone R > L.      General Comments        Pertinent Vitals/Pain Pain Location: Denied pain when asked, but grimaces during bed mobility.      Home Living                      Prior Function  PT Goals (current goals can now be found in the care plan section) Acute Rehab PT Goals Patient Stated Goal: to go home PT Goal Formulation: With patient Time For Goal Achievement: 01/20/14 Potential to Achieve Goals: Fair Progress towards PT goals: Progressing toward goals    Frequency  Min 4X/week    PT Plan Current plan remains appropriate    Co-evaluation             End of Session   Activity Tolerance: Patient limited by fatigue Patient left: in bed;with call bell/phone within reach;with family/visitor present     Time: 1975-8832 PT Time Calculation (min): 46 min  Charges:  $Therapeutic Activity: 38-52 mins                    G CodesCatarina Hartshorn, [place] 01/13/2014, 11:04 AM

## 2014-01-13 NOTE — Progress Notes (Signed)
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 Plan is to transition to Xarelto once all her surgeries are complete  Pain on a scale of 0-10 is:     If Spine Met(s), symptoms, if any, include:  Bowel/Bladder retention or incontinence (please describe): Yes, prior to laminectomy done 12/15/2013  Numbness or weakness in extremities (please describe): Yes, prior to laminectomy done 12/15/2013  Current Decadron regimen, if applicable: N/A  Ambulatory status? Walker? Wheelchair?: Bedridden  SAFETY ISSUES:  Prior radiation? no  Pacemaker/ICD? no  Possible current pregnancy? no  Is the patient on methotrexate? no  Current Complaints / other details:  48 year old female. Single.

## 2014-01-13 NOTE — Progress Notes (Signed)
Pt transferred to 4North bed 3 per bariatric bed. Settled in room. Given Public affairs consultant. RN aware pt is here. Accompanied by RN and Nurse tech

## 2014-01-13 NOTE — Progress Notes (Signed)
Dr Christella Noa here examining pt. Removed back dressing no drainage or redness noted.  MD says may leave incision open to air.

## 2014-01-13 NOTE — Progress Notes (Signed)
Patient ID: Sarah [name], female   DOB: [birth year], 48 y.o.   MRN: [id number]   Doing well, and no pain. Voice normal. Calcium normal. Pathology revealed her. Carcinoma. This matched the pathology of the thoracic spine lesion. Continue treatment with radial iodine and external beam radiation. Staples can be removed Monday. Contact me if needed.

## 2014-01-13 NOTE — Progress Notes (Signed)
Family Medicine Teaching Service Daily Progress Note Intern Pager: [phone]  Patient name: Sarah [name] Medical record number: [id number] Date of birth: July 17, 1965 Age: 48 y.o. Gender: female  Primary Care Provider: No PCP Per Patient Consultants: Neurosurgery, IR, Oncology, Endocrinology Code Status: Full  Pt Overview and Major Events to Date:  - 9/22: Pt. Admitted with lower extremity weakness.  - 9/23: Pt. Found to have mass at T-3 level with spinal compression.  - 9/24: Surgery performed for decompression and biopsy overnight. Pt. Post-op and doing well. 3cm Suprahilar mass on CT Chest, PE also identified. ?Thyroid etiology, but much less likely for mets. Oncology consulted.  - 9/25 - 9/29: Awaiting pathology results with plan for oncology to see once path returns. Pt. With little neurological improvement. Started with light PT.  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 Oncology on board. Neurosurgery to comment on need for spinal stabilization surgery.  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 - 10/8: Underwent spinal stabilization surg. - 10/10: Restarted heparin  - 10/13: Dr. GerkiHarlow Asale to perform surg as inpatient; will seek other consultations - 10/14: Dr. RosenConstance HolsterNT consulted. He will see pt on 10/15. - 10/16: ENT to perform Thyroidectomy Monday 10/19 - 10/17: All cell lines (WBC, Hgb, Plts) continue to drop: Checking Labs as below - 10/18: Midnight, holding heparin in preparation for surgery 10/19 - 10/19: Total thyroidectomy - 10/22: [facility]  Assessment and Plan: VickiMAKEYLA [name] 48 y.53 female presenting with continued lower extremity weakness  at baseline after discharge from hospital on 9/21. PMH is significant for obesity, HTN, and ?History of Hypothyroidism previously on Synthroid per patient.   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  Neurosurgery took her to the OR on 9/23, and biopsied as well as performed laminectomy for decompression. Surgical pathology with metastatic adenocarcinoma. Likely poor prognosis.  - Neurosurgery following: Patient was taken for spinal stabilization 10/9.  - Oncology following (Dr. MagriJana Hakimpatient; Dr. MohamJulien Nordmanntpatient); Neurosurgery (Dr. CabbeChristella Noadocrinology (Dr. Kerr)Buddy DutyT (Dr. 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  - Large thyroid goiter noted. TSH normal; ultrasound on 10/1, Biopsy on 10/2 - FNA results: "clusters of neoplastic cells w/ granular/oncogenic cytoplasm--similar to previous bx" - Dr. RosenConstance Holsterowing. Greatly appreciate the input. 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  - PT recommendations CIR vs SNF with 24hour supervision. Will plan dispo based on goals of care discussion. - Per SW; SNF is an option, but will need estimated time of DC; pt currently would rather go home than to SNF but is willing to consider other options. - Foley catheter in place for urinary retention; Stooling regularly, no sxs of fecal retention.  - Discussed Recs for PT with Neurosurgery. Light PT at this point. Pt. Not to get UOB.  - TED hose  #  Pulmonary Embolus - Found on CT Chest  Pt. With brief episodes of tachycardia, but otherwise asymptomatic with O2 sats 100% on 2L [place] >> now on Room Air.  Blood pressures  stable. Lower extremity Venous Duplex negative for DVT.  - Hypercoagulable 2/2 neoplasia . ESR 59 (9/22)  - Now on Argatroban - Will continue to monitor VS. Stable at this time.  # Anemia and Thrombocytopenia: Hgb and Plts have been trending down slowly since spinal stabilization surgery on 10/7.  Plts continue down trended while on heparin. HIT panel negative. No signs of end organ damage so low suspicion for TTP, DIC - FOBT - negative - Continue to follow CBC - HIT panel: neg - Argatroban started - Will consider Iron, TIBC, B12, and folate if Hgb downtrend continues  #Hyperglycemia -  Likely diabetic with PCOS given A1C of 6.7. CBG's 180's - 200s.  - Patient informed of elevated A1C and likely diagnosis of DMII. She agrees with [place] that this is a marginal issue at this point, and we will recommend treatment based upon our goals of care discussion.  - Continue to monitor.  - Resistant SSI; consider restarting 15U Lantus tomorrow  #Hypervirilization - with clitoromegaly noted on prior exam and buffalo hump and obesity as well as upper lip hair. PCOS vs. Adrenal patholog. Hypertensive and not hypotensive. Testing during this hospitalization after having recently received steroids would be largely non-diagnostic. Though, could also be a combination of isolated processes i.e. Clitoromegaly, Hirsutism 2/2 obesity. - Will leave for Outpatient f/u -- [place] to assess ovaries vs 21-hydroxylase/ACTH/cortisol.  - TSH, Cortisol WNL - A1C : 6.7 - consistent with PCOS given evidence of cystic structures in pelvis.   # UTI, 2/2 foley catheter - (10/2)UCx = Proteus - Resolved; (10/20)UCx = E Coli, resistant to cipro - dysuria and suprapubic pain noted PM 10/1. - Cipro 298m BID (10/2 started) -- treated for 7 days -- DC 10/9. - 10/23 started on Keflex x 7 days  #HTN- BP well controlled -continue losartan.   #Obesity-  - consistent with potential PCOS.   FEN/GI: Reg diet; KVO Prophylaxis: on full dose  IV heparin (until held tonight for thyroidectomy tomorrow), SCDs  Disposition: Pending Goals of Care discussion and endocrinology recommendations. Possible discharge early next week.  Subjective: [name] doing well. Still stooling regularly. Would like to be transferred out of stepdown.   Objective: Temp:  [98 F (36.7 C)-98.6 F (37 C)] 98.2 F (36.8 C) (10/23 1635) Pulse Rate:  [78-88] 88 (10/23 1100) Resp:  [12-26] 26 (10/23 1100) BP: (122-136)/(64-76) 122/74 mmHg (10/23 1635) SpO2:  [98 %-100 %] 100 % (10/23 1100) Physical Exam: General: NAD, AAOx3, morbidly obese  HEENT: AT/,  neck supple, J-line in place Cardio: distant heart sounds due to habitus Lungs: Normal effort, CTA bilateral anteriorly. Abdomen: Soft and nontender, nondistended, No organomegaly noted.  Extremities: no edema,  No calf tenderness, feels a "tickle" sensation Skin: No lesions or rashes noted Neuro: cranial nerves grossly normal. Normal speech; patient moved lower extremities today. Unsure if this movement was from reflex or voluntary spontaneous movement. When asked to repeat she was unable. She and mother reported 2 other experiences where movement occurred, which seemed purposeful--but w/o ability to repeat. Will monitor.   Laboratory:  Recent Labs Lab 01/11/14 0326 01/12/14 0505 01/13/14 0855  WBC 4.3 3.7* 3.7*  HGB 7.9* 7.9* 8.2*  HCT 23.1* 23.4* 24.9*  PLT 164 194 236    Recent Labs Lab 01/07/14 1304  01/09/14 2102  01/11/14 0326 01/12/14 0505 01/13/14 0855  NA 134*  < > 134*  --  140 138 142  K 3.8  < > 4.2  --  3.8 3.6* 3.7  CL 94*  --  95*  --  99 98 101  CO2 29  --  24  --  29 27 28   BUN 7  --  9  --  14 15 9   CREATININE 0.67  --  0.77  --  0.80 0.69 0.69  CALCIUM 8.3*  --  8.6  8.4  < > 8.2* 8.2* 8.0*  PROT 5.6*  --  5.6*  --   --   --   --   BILITOT 0.7  --  1.6*  --   --   --   --   ALKPHOS 398*  --  329*  --   --   --   --   ALT 54*  --  45*  --   --   --   --    AST 38*  --  39*  --   --   --   --   GLUCOSE 196*  < > 212*  --  119* 108* 109*  < > = values in this interval not displayed. Urinalysis    Component Value Date/Time   COLORURINE YELLOW 01/09/2014 2215   APPEARANCEUR TURBID* 01/09/2014 2215   LABSPEC 1.004* 01/09/2014 2215   PHURINE 7.5 01/09/2014 2215   GLUCOSEU NEGATIVE 01/09/2014 2215   HGBUR LARGE* 01/09/2014 2215   BILIRUBINUR NEGATIVE 01/09/2014 2215   KETONESUR 15* 01/09/2014 2215   PROTEINUR NEGATIVE 01/09/2014 2215   UROBILINOGEN 0.2 01/09/2014 2215   NITRITE POSITIVE* 01/09/2014 2215   LEUKOCYTESUR LARGE* 01/09/2014 2215    Phos - 3.9 Mg - 2.2 CRP - 11.6 ESR - 59 CK - 56 B12 - 209 Random Cortisol - 25.4 A1C - 6.7 TSH - 2.13 RPR - nonreactive HIV - nonreactive UPT - Negative  Imaging/Diagnostic Tests:  CT Chest Abd/ Pelvis 9/23:  CLINICAL DATA: Destructive metastatic lesion of the T3 vertebral  body with no known primary carcinoma.  EXAM:  CT CHEST, ABDOMEN, AND PELVIS WITH CONTRAST  TECHNIQUE:  Multidetector CT imaging of the chest, abdomen and pelvis was  performed following the standard protocol during bolus  administration of intravenous contrast.  CONTRAST: 167m OMNIPAQUE IOHEXOL 300 MG/ML SOLN  COMPARISON: MRI of the cervical spine earlier today.  FINDINGS:  CT CHEST FINDINGS  Within the chest, a right suprahilar mass measures approximately 3.0  x 2.7 x 2.2 cm. This either represents a nodal metastasis or central  lung mass such as small cell carcinoma. A number of smaller  mediastinal lymph nodes are also identified in the right  paratracheal region, prevascular space and AP window.  Destructive lesion of the T3 vertebral body noted which nearly  replaces the entire vertebral body with tumor extension posteriorly  into the epidural space causing significant compression of the  spinal cord. Paravertebral extension of tumor is also suspected  lateral and anterior to the vertebral body, right  greater than left.  Massive enlargement of the thyroid gland present with transverse  dimensions of approximately 7.7 x 8.5 cm. The thyroid gland is  incompletely visualized as the neck was not included. Central  dystrophic calcification identified. The enlarged thyroid gland does  cause significant mass effect on the airway with evidence of  tracheal deviation to the right and tracheal narrowing at the  thoracic inlet.  The enlarged thyroid gland extends into the superior  mediastinum.  There is evidence of pulmonary embolism with nonocclusive thrombus  identified in the right middle lobe and lower lobe pulmonary  arteries. Pulmonary arterial opacification is not optimal on the  standard venous phase evaluation of the chest but the right sided  pulmonary embolism is clearly present.  There are small bilateral pulmonary nodules. Right-sided pulmonary  nodule measuring 6 mm likely localizes to the very top of the lower  lobe near the major fissure. Left upper lobe nodule measures 7 mm.  Lingular nodule measures 7 mm. Left lower lobe nodule measures 7 mm.  Faint peripheral right middle lobe nodule measures 3 mm. Inferior  right middle lobe nodule measures 6 mm. Several small nodules in the  posterior right upper lobe identified in the 3-4 mm range.  No evidence of pleural or pericardial fluid. No other bony lesions  identified in the thorax.  CT ABDOMEN AND PELVIS FINDINGS  Small low-density lesion in the left lobe of the liver measures 7 mm  and likely represents a cyst. No other hepatic abnormalities. The  gallbladder, pancreas, spleen, adrenal glands and kidneys are  unremarkable. No adenopathy is identified in the abdomen or pelvis.  Bowel shows normal caliber without evidence of obstruction, lesion  or inflammation. No abnormal fluid collections.  The bladder is moderately distended. Elongated cystic changes in  both adnexal regions identified which may represent a combination of   ovarian cysts and potentially hydrosalpinx. Correlation with pelvic  ultrasound is recommended. No hernias are identified. The uterus  appears unremarkable by CT. No bony lesions are identified in the  abdomen or pelvis.  IMPRESSION:  1. 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  2. 3 cm right suprahilar mass may represent nodal metastasis versus  primary central tumor. Next number multiple small bilateral  pulmonary nodules consistent with metastatic disease.  3. Acute nonocclusive pulmonary embolism in the right middle lobe  and right lower lobe pulmonary arteries.  4. Massive thyroid goiter causing tracheal deviation to the right  and narrowing of the trachea.  5. Tubular cystic abnormalities of both adnexal regions which may be  a combination of cystic changes in the ovaries and potentially  hydrosalpinx. Correlation recommended with pelvic ultrasound to  exclude any suspicious cystic lesions of the adnexae.  6. Critical Value/emergent results were called by telephone at the  time of interpretation on 12/14/2013 at 4:52 pm to Dr. Tamala Julian of the  [facility] Medicine Teaching Service, who verbally acknowledged these  results.  Electronically Signed  By: Aletta Edouard M.D.  On: 12/14/2013 16:53   MRI T-spine 9/23:  CLINICAL DATA: Lower extremity weakness. Worsening back pain.  Compressive upper thoracic mass partially visualized on cervical  spine MRI as well as chest CT with spinal cord compression.  EXAM:  MRI THORACIC SPINE WITHOUT CONTRAST  TECHNIQUE:  Multiplanar, multisequence MR imaging of the thoracic spine was  performed. No intravenous contrast was administered.  COMPARISON: Cervical spine MRI and chest CT earlier today  FINDINGS:  There is a T3 compression fracture with approximately 50% height  loss centrally. As described on cervical spine  MRI and chest CT  earlier today, there is abnormal soft tissue replacing the majority  of the T3 vertebral body extending into the pedicles as well as into  the ventral epidural space with moderate cord compression. Epidural  extension is slightly greater on the right and extends into the  inferior aspect of the right T2-3 neural foramen as well as superior  aspect of the right T3-4 neural foramen. There is also abnormal  paravertebral soft tissue laterally and anteriorly as seen on CT.  Central ventral epidural material posterior to the T4 vertebral body  may represent tumor or hematoma. Mild T2 hyperintensity is present  in the spinal cord at the T3 level extending inferiorly to the T4-5  disc space level consistent with mild edema. Mild edema is present  in the posterior paraspinal soft tissues of the upper thoracic  spine. No other vertebral body lesions are identified.  There is a moderate-sized central disc extrusion at T7-8 which  contacts and mildly flattens the ventral spinal cord with mild  spinal stenosis. A small left central disc protrusion is present at  T8-9 with at most minimal impression on the spinal cord. Massive  enlargement of the left thyroid lobe is partially visualized.  Mediastinal and lung findings are better demonstrated on recent  chest CT.  IMPRESSION:  1. T3 vertebral body mass with pathologic compression fracture and  epidural tumor resulting in moderate cord compression with evidence  of mild spinal cord edema.  2. T7-8 disc extrusion resulting in mild spinal stenosis.   MRI Head / C-Spine 9/23:  CLINICAL DATA: 48 year old female who presented with back pain and  lower extremity numbness, with continued inadequately explained  lower extremity weakness following Hospital discharge on 12/12/2013.  Initial encounter.  EXAM:  MRI HEAD WITHOUT AND WITH CONTRAST  MRI CERVICAL SPINE WITHOUT AND WITH CONTRAST  TECHNIQUE:  Multiplanar, multiecho pulse  sequences of the brain and surrounding  structures, and cervical spine, to include the craniocervical  junction and cervicothoracic junction, were obtained without and  with intravenous contrast.  CONTRAST: 20 mL MultiHance.  COMPARISON: Lumbar MRI 12/10/2013.  FINDINGS:  MRI HEAD FINDINGS  Cerebral volume is normal. No restricted diffusion to suggest acute  infarction. No midline shift, mass effect, evidence of mass lesion,  ventriculomegaly, extra-axial collection or acute intracranial  hemorrhage. Cervicomedullary junction and pituitary are within  normal limits. Major intracranial vascular flow voids are preserved.  Pearline Cables and white matter signal is within normal limits for age  throughout the brain. No abnormal enhancement identified, pulsation  artifact prominent on axial post-contrast images.  Large body habitus. Small right frontal scalp lipoma (series 10,  image 37). Visible internal auditory structures appear normal. Mild  right mastoid fluid. Minor paranasal sinus mucosal thickening.  Visualized orbit soft tissues are within normal limits. Partially  visible thyromegaly greater on the left, see below. Normal bone  marrow signal.  MRI CERVICAL SPINE FINDINGS  Large body habitus resulting in wrap artifact on axial images.  However, there is massive thyroid enlargement evident, with the left  lobe measuring at least 7.2 x 5.2 x 11.2 cm (AP by transverse by  CC). The trachea is displaced to the right at the thoracic inlet and  mildly compressed. There is other regional mass effect including  sheath. On the left carotid  Fairly preserved cervical lordosis. No marrow edema or evidence of  acute osseous abnormality. Cervicomedullary junction is within  normal limits. Spinal cord signal is within normal limits at all  visualized levels. No abnormal intradural enhancement.  C2-C3: Negative.  C3-C4: Disc osteophyte complex, facet and ligament flavum  hypertrophy. Along with a degree  of congenital canal narrowing,  there is mild spinal stenosis and moderate biforaminal  stenosis.  C4-C5: Mild uncovertebral and facet hypertrophy. Mild right C5  foraminal stenosis.  C5-C6: Circumferential disc osteophyte complex. Along with a degree  of congenital canal narrowing there is mild spinal stenosis.  Moderate right C6 foraminal stenosis.  C6-C7: Negative except for mild facet hypertrophy.  C7-T1: Negative.  T1-T2: Negative.  Faintly visualized at the T3 level in the upper thoracic spine there  is a lobulated solidly enhancing mass which appears to extend into  the epidural space from the T3 vertebral body. This is inseparable  from the spinal cord which is likely compressed. See series 2000  image 7 and series 1500, image 6. This is not included on axial  images.  IMPRESSION:  1. Faintly visible but aggressive appearing enhancing mass at the T3  level of the upper thoracic spine, likely with spinal cord  compression evaluate further are with thoracic MRI without and with  contrast.  2. Normal for age MRI appearance of the brain.  3. Massive thyroid enlargement, greater on the left. Left thyroid  goiter/mass measuring at least 7.2 x 5.2 x 11.2 cm. Mass effect at  the thoracic inlet including on the trachea.  4. Mild chronic disc and endplate degeneration in the cervical spine  at C3-C4 and C5-C6 resulting in borderline to mild spinal stenosis,  but no cervical spinal cord mass effect or signal abnormality.  Electronically Signed:  By: Lars Pinks M.D.  On: 12/14/2013 09:50   MRI L-Spine 9/19:  CLINICAL DATA: Back pain with bilateral leg numbness, worsening  with oral steroids.  EXAM:  MRI LUMBAR SPINE WITHOUT CONTRAST  TECHNIQUE:  Multiplanar, multisequence MR imaging of the lumbar spine was  performed. No intravenous contrast was administered.  COMPARISON: CT 12/09/2013  FINDINGS:  There is no degenerative disease at L2-3 or above. The discs are  normal in signal  characteristics and morphology. The spinal canal  and foramina are widely patent. The distal cord and conus are normal  with conus tip at L1. There are small benign and insignificant  hemangiomas within the L2 and L3 vertebral bodies.  L3-4: Minimal desiccation of the disc. Mild circumferential bulging  slightly more prominent towards the left. Bilateral facet  degeneration and hypertrophy. Mild narrowing of the lateral recesses  without apparent neural compression. Mild foraminal narrowing on the  left without apparent neural compression.  L4-5: Desiccation of the disc with circumferential protrusion.  Bilateral facet and ligamentous hypertrophy. Stenosis of both  lateral recesses without definite focal neural compression. Mild  foraminal narrowing bilaterally.  L5-S1: Desiccation of the disc with loss of height. Endplate  osteophytes and bulging of the disc more prominent towards the left.  Bilateral facet degeneration and hypertrophy left more than right.  Stenosis of the subarticular lateral recess on the left that could  compress the S1 nerve root. Mild foraminal narrowing left more than  right but without apparent compression of the exiting L5 nerve  roots.  Serpiginous fluid-filled structure in the left pelvis could  represent hydrosalpinx. This is not primarily or completely imaged.  IMPRESSION:  Degenerative disc disease and degenerative facet disease in the  lower lumbar spine that could be a cause of lumbago.  Lateral recess and foraminal narrowing at L3-4, L4-5 and L5-S1 as  described above that could be a cause of focal neural compression.  In general, this is more pronounced on the left than on the right.  In particular, there is potential for left S1 nerve root compression  in the subarticular lateral  recess at L5-S1.  Electronically Signed  By: Nelson Chimes M.D.  On: 12/10/2013 08:45   CT L-Spine 9/18:  CLINICAL DATA: Low back pain with difficulty walking.  EXAM:   CT LUMBAR SPINE WITHOUT CONTRAST  TECHNIQUE:  Multidetector CT imaging of the lumbar spine was performed without  intravenous contrast administration. Multiplanar CT image  reconstructions were also generated.  COMPARISON: None.  FINDINGS:  There is no fracture or spondylolisthesis. There is moderately  severe disc space narrowing at L5-S1 with slight vacuum phenomenon  at this level. There is moderate narrowing at L4-5. There are  prominent anterior osteophytes at L2, L3, L4, and L5.  At T12-L1, there is moderate facet hypertrophy bilaterally. There is  no nerve root edema or effacement. No disc extrusion or stenosis.  At L1-2, there is moderate facet hypertrophy bilaterally. There is  mild diffuse disc bulging. There is no nerve root edema or  effacement. No disc extrusion or stenosis.  At L2-3, there is broad-based disc bulging and moderate facet  hypertrophy bilaterally. There is borderline narrowing of the thecal  sac with borderline spinal stenosis. No disc extrusion.  At L3-4, there is severe facet osteoarthritic change bilaterally,  more pronounced on the left than on the right. There is broad-based  disc bulging and moderate ligamentum flavum hypertrophy. These  findings in concert lead to moderate generalized spinal stenosis.  At L4-5, there is broad-based disc protrusion and severe facet  osteoarthritic change bilaterally. These findings in concert lead to  moderate generalized spinal stenosis. No frank disc extrusion.  At L5-S1, there is severe facet osteoarthritic change on the left  and moderate facet osteoarthritic change on the right. There is  broad-based disc protrusion. There is exit foraminal narrowing  bilaterally, more severe on the left than on the right, due to  osteophytic change. No frank disc extrusion or stenosis is seen at  this level.  There are no paraspinous lesions.  IMPRESSION:  Spinal stenosis at L3-4 and L4-5, multifactorial. Borderline spinal   stenosis at L2-3. There does appear to be a degree of congenital  narrowing of the canal which contributes to spinal stenosis.  Multilevel arthropathy.  No frank disc extrusion.  No fracture or spondylolisthesis.  Electronically Signed  By: Lowella Grip M.D.  On: 12/09/2013 13:50   CXR 9/13:  CLINICAL DATA: Upper back pain for 5 weeks. Dyspnea on exertion.  Nonsmoker. History of hypertension.  EXAM:  CHEST 2 VIEW  COMPARISON: 04/15/2006  FINDINGS:  The heart is mildly enlarged. There is elevation of right  hemidiaphragm, stable in appearance. There is streaky density  favored to be within the right lower lobe, consistent with  atelectasis or infiltrate. No pulmonary edema. No pleural effusions.  There are moderate degenerative changes in the thoracic spine.  IMPRESSION:  Right lower lobe atelectasis or infiltrate.  Electronically Signed  By: Shon Hale M.D.  On: 12/04/2013 19:26  Venous Duplex 9/24:  Summary:  - No evidence of deep vein thrombosis involving the right lower extremity. - No evidence of deep vein thrombosis involving the left lower extremity.   Surgical Pathology Report: 12/21/2013 Diagnosis Soft tissue mass, simple excision, Thoracic Epidural Tumor - METASTATIC CARCINOMA. PLEASE SEE COMMENT. Microscopic Comment There are clusters of malignant cells with abundant eosinophilic cytoplasm. A battery of immunostains were performed and the tumor cells show the following profile: Cytokeratin 7 - strongly positive Cytokeratin 8/18 - strongly positive TTF-1 - strongly positive. Thyroglobulin - negative CD10 - negative Cytokeratin 20 - negative S100 -  negative Vimentin - negative The control stained appropriately. The overall findings are diagnostic for metastatic carcinoma. 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 However, a metastatic oncocytic  thyroid carcinoma is still in the differential. Clinical and radiographic correlation is highly recommended. (HCL:kh 12-19-13) Aldona Bar MD Pathologist, Electronic Signature (Case signed 12/20/2013)  Elberta Leatherwood, MD 01/13/2014, 4:58 PM PGY-1 [facility] pager: [phone], text pages welcome

## 2014-01-13 NOTE — Progress Notes (Signed)
Called report to [address] [place] to [facility]. Pt to be transferred per bariatric bed to [facility] bed 3 with all belongings

## 2014-01-14 LAB — GLUCOSE, CAPILLARY
GLUCOSE-CAPILLARY: 111 mg/dL — AB (ref 70–99)
Glucose-Capillary: 124 mg/dL — ABNORMAL HIGH (ref 70–99)
Glucose-Capillary: 147 mg/dL — ABNORMAL HIGH (ref 70–99)
Glucose-Capillary: 126 mg/dL — ABNORMAL HIGH (ref 70–99)

## 2014-01-14 LAB — CBC
HCT: 24.6 % — ABNORMAL LOW (ref 36.0–46.0)
Hemoglobin: 8.1 g/dL — ABNORMAL LOW (ref 12.0–15.0)
MCH: 28.4 pg (ref 26.0–34.0)
MCHC: 32.9 g/dL (ref 30.0–36.0)
MCV: 86.3 fL (ref 78.0–100.0)
PLATELETS: 280 10*3/uL (ref 150–400)
RBC: 2.85 MIL/uL — AB (ref 3.87–5.11)
RDW: 14.8 % (ref 11.5–15.5)
WBC: 4.2 10*3/uL (ref 4.0–10.5)

## 2014-01-14 LAB — COMPREHENSIVE METABOLIC PANEL
ALT: 18 U/L (ref 0–35)
AST: 18 U/L (ref 0–37)
Albumin: 2.2 g/dL — ABNORMAL LOW (ref 3.5–5.2)
Alkaline Phosphatase: 140 U/L — ABNORMAL HIGH (ref 39–117)
Anion gap: 12 (ref 5–15)
BUN: 7 mg/dL (ref 6–23)
CALCIUM: 7.8 mg/dL — AB (ref 8.4–10.5)
CHLORIDE: 101 meq/L (ref 96–112)
CO2: 28 meq/L (ref 19–32)
Creatinine, Ser: 0.76 mg/dL (ref 0.50–1.10)
GFR calc Af Amer: >90 mL/min (ref >90)
GFR calc non Af Amer: >90 mL/min (ref >90)
Glucose, Bld: 110 mg/dL — ABNORMAL HIGH (ref 70–99)
Potassium: 3.3 mEq/L — ABNORMAL LOW (ref 3.7–5.3)
SODIUM: 141 meq/L (ref 137–147)
Total Bilirubin: 0.5 mg/dL (ref 0.3–1.2)
Total Protein: 5.5 g/dL — ABNORMAL LOW (ref 6.0–8.3)

## 2014-01-14 LAB — APTT
APTT: 34 s (ref 24–37)
APTT: 47 s — AB (ref 24–37)
aPTT: 52 seconds — ABNORMAL HIGH (ref 24–37)
aPTT: 60 seconds — ABNORMAL HIGH (ref 24–37)

## 2014-01-14 MED ORDER — SODIUM CHLORIDE 0.9 % IJ SOLN
10.0000 mL | INTRAMUSCULAR | Status: DC | PRN
  Administered 2014-01-17 – 2014-01-18 (×2): 10 mL
  Administered 2014-01-18 – 2014-01-19 (×2): 20 mL

## 2014-01-14 MED ORDER — CEPHALEXIN 500 MG PO CAPS
500.0000 mg | ORAL_CAPSULE | Freq: Four times a day (QID) | ORAL | Status: DC
  Administered 2014-01-14 – 2014-01-19 (×21): 500 mg via ORAL
  Filled 2014-01-14 (×23): qty 1

## 2014-01-14 MED ORDER — INFLUENZA VAC SPLIT QUAD 0.5 ML IM SUSY
0.5000 mL | PREFILLED_SYRINGE | INTRAMUSCULAR | Status: DC
  Filled 2014-01-14: qty 0.5

## 2014-01-14 NOTE — Progress Notes (Signed)
ANTICOAGULATION CONSULT NOTE - Follow Up Consult  Pharmacy Consult for argatroban Indication: PE   Allergies  Allergen Reactions  . Tramadol Nausea And Vomiting    Patient Measurements: Height: 5' 11"  (180.3 cm) Weight: 313 lb 15 oz (142.4 kg) IBW/kg (Calculated) : 70.8   Vital Signs: Temp: 98 F (36.7 C) (10/24 0500) Temp Source: Oral (10/24 0500) BP: 134/76 mmHg (10/24 0500) Pulse Rate: 81 (10/24 0500)  Labs:  Recent Labs  01/12/14 0505 01/13/14 0855 01/14/14 0501  HGB 7.9* 8.2* 8.1*  HCT 23.4* 24.9* 24.6*  PLT 194 236 280  APTT 63* 58* 34  CREATININE 0.69 0.69 0.76    Estimated Creatinine Clearance: 136.4 ml/min (by C-G formula based on Cr of 0.76).   Medications:  Scheduled:  . antiseptic oral rinse  7 mL Mouth Rinse QID  . cephALEXin  500 mg Oral QID  . insulin aspart  0-20 Units Subcutaneous TID WC  . insulin aspart  0-5 Units Subcutaneous QHS  . levothyroxine  125 mcg Intravenous QAC breakfast  . senna  1 tablet Oral BID  . 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 Platelets have recovered (now 280) but pt remains anemic (hgb 8.1). After being therapeutic, aPTT this morning dripped to 34 seconds. ALT/AST + Tbili were elevated- have now normalized, alk phos remains elevated. If LFTs worsen again, may need to consider changing to bivalirudin.   Goal of Therapy:  APTT= 50-90 seconds Monitor platelets by anticoagulation protocol: Yes   Plan:  1. Increase Argatroban to 0.84mg/kg/min 2. Re-evaluate aPTT in 2 hours 3. Daily aPTT and CBC 4. Monitor LFTs (check in AM) 5. F/u plan for oral anticoagulation  Judithann Villamar D. Ahren Pettinger, PharmD, BCPS Clinical Pharmacist Pager: [phone]/24/2015 7:34 AM

## 2014-01-14 NOTE — Progress Notes (Signed)
Patient ID: Sarah [name], female   DOB: 1965/06/25, 48 y.o.   MRN: [id number] BP 128/73  Pulse 84  Temp(Src) 98.1 F (36.7 C) (Oral)  Resp 18  Ht 5\' 11"  (1.803 m)  Wt 142.4 kg (313 lb 15 oz)  BMI 43.80 kg/m2  SpO2 100%  LMP 11/28/2013 Alert and oriented x 4 Moving upper extremities well No real voluntary movement today in lower extremities Wound is clean, dry, no signs of infection

## 2014-01-14 NOTE — Progress Notes (Signed)
I discussed with  Dr Alease Frame.  I agree with their plans documented in their progress note.

## 2014-01-14 NOTE — Progress Notes (Addendum)
ANTICOAGULATION CONSULT NOTE - Follow Up Consult  Pharmacy Consult for argatroban Indication: PE   Allergies  Allergen Reactions  . Tramadol Nausea And Vomiting    Patient Measurements: Height: 5\' 11"  (180.3 cm) Weight: 313 lb 15 oz (142.4 kg) IBW/kg (Calculated) : 70.8   Vital Signs: Temp: 98.1 F (36.7 C) (10/24 1024) Temp Source: Oral (10/24 1024) BP: 128/73 mmHg (10/24 1024) Pulse Rate: 84 (10/24 1024)  Labs:  Recent Labs  01/12/14 0505 01/13/14 0855 01/14/14 0501 01/14/14 1000  HGB 7.9* 8.2* 8.1*  --   HCT 23.4* 24.9* 24.6*  --   PLT 194 236 280  --   APTT 63* 58* 34 47*  CREATININE 0.69 0.69 0.76  --     Estimated Creatinine Clearance: 136.4 ml/min (by C-G formula based on Cr of 0.76).   Medications:  Scheduled:  . antiseptic oral rinse  7 mL Mouth Rinse QID  . cephALEXin  500 mg Oral QID  . [START ON 01/15/2014] Influenza vac split quadrivalent PF  0.5 mL Intramuscular Tomorrow-1000  . insulin aspart  0-20 Units Subcutaneous TID WC  . insulin aspart  0-5 Units Subcutaneous QHS  . levothyroxine  125 mcg Intravenous QAC breakfast  . senna  1 tablet Oral BID  . 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 Platelets have recovered (now 280) but pt remains anemic (hgb 8.1). APTT (47) remains slightly subthreapeutic after rate changed to 0.85mcg/kg/min. Per RN, rate was increase at 0800 this morning.  Goal of Therapy:  APTT= 50-90 seconds Monitor platelets by anticoagulation protocol: Yes   Plan:  1. Increase Argatroban to 0.75 mcg/kg/min = 6.4 ml/hr 2. Re-evaluate aPTT in 2 hours 3. Daily aPTT and CBC 4. Monitor LFTs (check in AM) 5. F/u plan for oral anticoagulation  Maryanna Shape, PharmD, BCPS   Clinical Pharmacist  Pager: [phone]  01/14/2014 12:02 PM  Addendum: APTT level now at low-end goal = 52, will conitnue argatroban at current rate, and f/u confirmatory aPTT at 1800  Addendum: Confirmatory aPTT = 60, at goal. Will continue argatroban at current rate. F/u aPTT in the morning.

## 2014-01-14 NOTE — Progress Notes (Signed)
Family Medicine Teaching Service Daily Progress Note Intern Pager: [phone]  Patient name: Sarah [name] Medical record number: [id number] Date of birth: 04-12-1965 Age: 48 y.o. Gender: female  Primary Care Provider: No PCP Per Patient Consultants: Neurosurgery, IR, Oncology, Endocrinology Code Status: Full  Pt Overview and Major Events to Date:  - 9/22: Pt. Admitted with lower extremity weakness.  - 9/23: Pt. Found to have mass at T-3 level with spinal compression.  - 9/24: Surgery performed for decompression and biopsy overnight. Pt. Post-op and doing well. 3cm Suprahilar mass on CT Chest, PE also identified. ?Thyroid etiology, but much less likely for mets. Oncology consulted.  - 9/25 - 9/29: Awaiting pathology results with plan for oncology to see once path returns. Pt. With little neurological improvement. Started with light PT.  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 Oncology on board. Neurosurgery to comment on need for spinal stabilization surgery.  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 - 10/8: Underwent spinal stabilization surg. - 10/10: Restarted heparin  - 10/13: Dr. GerkiHarlow Asale to perform surg as inpatient; will seek other consultations - 10/14: Dr. RosenConstance HolsterNT consulted. He will see pt on 10/15. - 10/16: ENT to perform Thyroidectomy Monday 10/19 - 10/17: All cell lines (WBC, Hgb, Plts) continue to drop: Checking Labs as below - 10/18: Midnight, holding heparin in preparation for surgery 10/19 - 10/19: Total thyroidectomy - 10/22: [facility]  Assessment and Plan: Sarah [name] 48 y.28 female presenting with continued lower extremity weakness  at baseline after discharge from hospital on 9/21. PMH is significant for obesity, HTN, and ?History of Hypothyroidism previously on Synthroid per patient.   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  Neurosurgery took her to the OR on 9/23, and biopsied as well as performed laminectomy for decompression. Surgical pathology with metastatic adenocarcinoma. Likely poor prognosis.  - Neurosurgery following: Patient was taken for spinal stabilization 10/9.  - Oncology following (Dr. MagriJana Hakimpatient; Dr. MohamJulien Nordmanntpatient); Neurosurgery (Dr. CabbeChristella Noadocrinology (Dr. Kerr)Buddy DutyT (Dr. 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  - Large thyroid goiter noted. TSH normal; ultrasound on 10/1, Biopsy on 10/2 - FNA results: "clusters of neoplastic cells w/ granular/oncogenic cytoplasm--similar to previous bx" - Dr. RosenConstance Holsterowing. Greatly appreciate the input. - Thyroidectomy 10/19 - to be transferred out of stepdown 10/23 - Will need to begin radioiodine therapy and external beam radiation when deemed appropriate by oncology.  - Per ENT - skin staples to be removed Mon 10/26 - Possibility of DC to CIR sometime next week.  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  - PT recommendations CIR vs SNF with 24hour supervision. Will plan dispo based on goals of care discussion. - Per SW; SNF is an option, but will need estimated time of DC; pt currently would rather go home than to SNF but is willing to consider other options. -  Foley catheter in place for urinary retention; Stooling regularly, no sxs of fecal retention.  - Discussed Recs for PT with Neurosurgery. Light PT at this point. Pt. Not  to get UOB.  - TED hose  #Pulmonary Embolus - Found on CT Chest  Pt. With brief episodes of tachycardia, but otherwise asymptomatic with O2 sats 100% on 2L [place] >> now on Room Air.  Blood pressures stable. Lower extremity Venous Duplex negative for DVT.  - Hypercoagulable 2/2 neoplasia . ESR 59 (9/22)  - Now on Argatroban - Will continue to monitor VS. Stable at this time.  # Anemia and Thrombocytopenia: Hgb and Plts have been trending down slowly since spinal stabilization surgery on 10/7.  Plts continue down trended while on heparin. HIT panel negative. No signs of end organ damage so low suspicion for TTP, DIC - FOBT - negative - Continue to follow CBC - HIT panel: neg - Argatroban; will transition to PO in next 24-48hrs - Will consider Iron, TIBC, B12, and folate if Hgb downtrend continues  #Hyperglycemia -  Likely diabetic with PCOS given A1C of 6.7. CBG's 180's - 200s.  - Patient informed of elevated A1C and likely diagnosis of DMII. She agrees with [place] that this is a marginal issue at this point, and we will recommend treatment based upon our goals of care discussion.  - Continue to monitor.  - Resistant SSI; consider restarting 15U Lantus tomorrow  #Hypervirilization - with clitoromegaly noted on prior exam and buffalo hump and obesity as well as upper lip hair. PCOS vs. Adrenal patholog. Hypertensive and not hypotensive. Testing during this hospitalization after having recently received steroids would be largely non-diagnostic. Though, could also be a combination of isolated processes i.e. Clitoromegaly, Hirsutism 2/2 obesity. - Will leave for Outpatient f/u -- [place] to assess ovaries vs 21-hydroxylase/ACTH/cortisol.  - TSH, Cortisol WNL - A1C : 6.7 - consistent with PCOS given evidence of cystic structures in pelvis.   # UTI, 2/2 foley catheter - (10/2)UCx = Proteus - Resolved; (10/20)UCx = E Coli, resistant to cipro - dysuria and suprapubic pain noted PM 10/1. - Cipro 242m BID (10/2  started) -- treated for 7 days -- DC 10/9. - 10/23 started on Keflex x 7 days  #HTN- BP well controlled -continue losartan.   #Obesity-  - consistent with potential PCOS.   FEN/GI: Reg diet; KVO Prophylaxis: on full dose IV heparin (until held tonight for thyroidectomy tomorrow), SCDs  Disposition: Pending Goals of Care discussion and endocrinology recommendations. Possible discharge early next week.  Subjective: [name] doing well. Still stooling regularly. Reports significant improvement and motivation w/ physical therapy. Patient may be regaining some motor function in BLE.  Objective: Temp:  [98 F (36.7 C)-98.5 F (36.9 C)] 98.1 F (36.7 C) (10/24 1024) Pulse Rate:  [74-88] 84 (10/24 1024) Resp:  [16-26] 18 (10/24 1024) BP: (122-134)/(67-76) 128/73 mmHg (10/24 1024) SpO2:  [98 %-100 %] 100 % (10/24 1024) Weight:  [313 lb 15 oz (142.4 kg)] 313 lb 15 oz (142.4 kg) (10/23 1848) Physical Exam: General: NAD, AAOx3, morbidly obese  HEENT: AT/[place],  neck supple, surgical incision noted w/o concern for infection Cardio: distant heart sounds due to habitus Lungs: Normal effort, CTA bilateral anteriorly. Abdomen: Soft and nontender, nondistended, No organomegaly noted.  Extremities: no edema,  No calf tenderness, feels a "tickle" sensation Skin: No lesions or rashes noted Neuro: cranial nerves grossly normal. Normal speech; showing improvement each day.  Laboratory:  Recent Labs Lab 01/12/14 0505 01/13/14  [id number] 01/14/14 0501  WBC 3.7* 3.7* 4.2  HGB 7.9* 8.2* 8.1*  HCT 23.4* 24.9* 24.6*  PLT 194 236 280    Recent Labs Lab 01/07/14 1304  01/09/14 2102  01/12/14 0505 01/13/14 0855 01/14/14 0501  NA 134*  < > 134*  < > 138 142 141  K 3.8  < > 4.2  < > 3.6* 3.7 3.3*  CL 94*  --  95*  < > 98 101 101  CO2 29  --  24  < > 27 28 28   BUN 7  --  9  < > 15 9 7   CREATININE 0.67  --  0.77  < > 0.69 0.69 0.76  CALCIUM 8.3*  --  8.6  8.4  < > 8.2* 8.0* 7.8*  PROT 5.6*  --   5.6*  --   --   --  5.5*  BILITOT 0.7  --  1.6*  --   --   --  0.5  ALKPHOS 398*  --  329*  --   --   --  140*  ALT 54*  --  45*  --   --   --  18  AST 38*  --  39*  --   --   --  18  GLUCOSE 196*  < > 212*  < > 108* 109* 110*  < > = values in this interval not displayed. Urinalysis    Component Value Date/Time   COLORURINE YELLOW 01/09/2014 2215   APPEARANCEUR TURBID* 01/09/2014 2215   LABSPEC 1.004* 01/09/2014 2215   PHURINE 7.5 01/09/2014 2215   GLUCOSEU NEGATIVE 01/09/2014 2215   HGBUR LARGE* 01/09/2014 2215   BILIRUBINUR NEGATIVE 01/09/2014 2215   KETONESUR 15* 01/09/2014 2215   PROTEINUR NEGATIVE 01/09/2014 2215   UROBILINOGEN 0.2 01/09/2014 2215   NITRITE POSITIVE* 01/09/2014 2215   LEUKOCYTESUR LARGE* 01/09/2014 2215    Phos - 3.9 Mg - 2.2 CRP - 11.6 ESR - 59 CK - 56 B12 - 209 Random Cortisol - 25.4 A1C - 6.7 TSH - 2.13 RPR - nonreactive HIV - nonreactive UPT - Negative  Imaging/Diagnostic Tests:  CT Chest Abd/ Pelvis 9/23:  CLINICAL DATA: Destructive metastatic lesion of the T3 vertebral  body with no known primary carcinoma.  EXAM:  CT CHEST, ABDOMEN, AND PELVIS WITH CONTRAST  TECHNIQUE:  Multidetector CT imaging of the chest, abdomen and pelvis was  performed following the standard protocol during bolus  administration of intravenous contrast.  CONTRAST: 119m OMNIPAQUE IOHEXOL 300 MG/ML SOLN  COMPARISON: MRI of the cervical spine earlier today.  FINDINGS:  CT CHEST FINDINGS  Within the chest, a right suprahilar mass measures approximately 3.0  x 2.7 x 2.2 cm. This either represents a nodal metastasis or central  lung mass such as small cell carcinoma. A number of smaller  mediastinal lymph nodes are also identified in the right  paratracheal region, prevascular space and AP window.  Destructive lesion of the T3 vertebral body noted which nearly  replaces the entire vertebral body with tumor extension posteriorly  into the epidural space causing  significant compression of the  spinal cord. Paravertebral extension of tumor is also suspected  lateral and anterior to the vertebral body, right greater than left.  Massive enlargement of the thyroid gland present with transverse  dimensions of approximately 7.7 x 8.5 cm. The thyroid gland is  incompletely visualized as the neck was not included. Central  dystrophic calcification identified. The enlarged thyroid gland does  cause significant  mass effect on the airway with evidence of  tracheal deviation to the right and tracheal narrowing at the  thoracic inlet. The enlarged thyroid gland extends into the superior  mediastinum.  There is evidence of pulmonary embolism with nonocclusive thrombus  identified in the right middle lobe and lower lobe pulmonary  arteries. Pulmonary arterial opacification is not optimal on the  standard venous phase evaluation of the chest but the right sided  pulmonary embolism is clearly present.  There are small bilateral pulmonary nodules. Right-sided pulmonary  nodule measuring 6 mm likely localizes to the very top of the lower  lobe near the major fissure. Left upper lobe nodule measures 7 mm.  Lingular nodule measures 7 mm. Left lower lobe nodule measures 7 mm.  Faint peripheral right middle lobe nodule measures 3 mm. Inferior  right middle lobe nodule measures 6 mm. Several small nodules in the  posterior right upper lobe identified in the 3-4 mm range.  No evidence of pleural or pericardial fluid. No other bony lesions  identified in the thorax.  CT ABDOMEN AND PELVIS FINDINGS  Small low-density lesion in the left lobe of the liver measures 7 mm  and likely represents a cyst. No other hepatic abnormalities. The  gallbladder, pancreas, spleen, adrenal glands and kidneys are  unremarkable. No adenopathy is identified in the abdomen or pelvis.  Bowel shows normal caliber without evidence of obstruction, lesion  or inflammation. No abnormal fluid  collections.  The bladder is moderately distended. Elongated cystic changes in  both adnexal regions identified which may represent a combination of  ovarian cysts and potentially hydrosalpinx. Correlation with pelvic  ultrasound is recommended. No hernias are identified. The uterus  appears unremarkable by CT. No bony lesions are identified in the  abdomen or pelvis.  IMPRESSION:  1. 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  2. 3 cm right suprahilar mass may represent nodal metastasis versus  primary central tumor. Next number multiple small bilateral  pulmonary nodules consistent with metastatic disease.  3. Acute nonocclusive pulmonary embolism in the right middle lobe  and right lower lobe pulmonary arteries.  4. Massive thyroid goiter causing tracheal deviation to the right  and narrowing of the trachea.  5. Tubular cystic abnormalities of both adnexal regions which may be  a combination of cystic changes in the ovaries and potentially  hydrosalpinx. Correlation recommended with pelvic ultrasound to  exclude any suspicious cystic lesions of the adnexae.  6. Critical Value/emergent results were called by telephone at the  time of interpretation on 12/14/2013 at 4:52 pm to Dr. Tamala Julian of the  [facility] Teaching Service, who verbally acknowledged these  results.  Electronically Signed  By: Aletta Edouard M.D.  On: 12/14/2013 16:53   MRI T-spine 9/23:  CLINICAL DATA: Lower extremity weakness. Worsening back pain.  Compressive upper thoracic mass partially visualized on cervical  spine MRI as well as chest CT with spinal cord compression.  EXAM:  MRI THORACIC SPINE WITHOUT CONTRAST  TECHNIQUE:  Multiplanar, multisequence MR imaging of the thoracic spine was  performed. No intravenous contrast was administered.  COMPARISON: Cervical spine MRI and  chest CT earlier today  FINDINGS:  There is a T3 compression fracture with approximately 50% height  loss centrally. As described on cervical spine MRI and chest CT  earlier today, there is abnormal soft tissue replacing the majority  of the  T3 vertebral body extending into the pedicles as well as into  the ventral epidural space with moderate cord compression. Epidural  extension is slightly greater on the right and extends into the  inferior aspect of the right T2-3 neural foramen as well as superior  aspect of the right T3-4 neural foramen. There is also abnormal  paravertebral soft tissue laterally and anteriorly as seen on CT.  Central ventral epidural material posterior to the T4 vertebral body  may represent tumor or hematoma. Mild T2 hyperintensity is present  in the spinal cord at the T3 level extending inferiorly to the T4-5  disc space level consistent with mild edema. Mild edema is present  in the posterior paraspinal soft tissues of the upper thoracic  spine. No other vertebral body lesions are identified.  There is a moderate-sized central disc extrusion at T7-8 which  contacts and mildly flattens the ventral spinal cord with mild  spinal stenosis. A small left central disc protrusion is present at  T8-9 with at most minimal impression on the spinal cord. Massive  enlargement of the left thyroid lobe is partially visualized.  Mediastinal and lung findings are better demonstrated on recent  chest CT.  IMPRESSION:  1. T3 vertebral body mass with pathologic compression fracture and  epidural tumor resulting in moderate cord compression with evidence  of mild spinal cord edema.  2. T7-8 disc extrusion resulting in mild spinal stenosis.   MRI Head / C-Spine 9/23:  CLINICAL DATA: 48 year old female who presented with back pain and  lower extremity numbness, with continued inadequately explained  lower extremity weakness following Hospital discharge on 12/12/2013.  Initial  encounter.  EXAM:  MRI HEAD WITHOUT AND WITH CONTRAST  MRI CERVICAL SPINE WITHOUT AND WITH CONTRAST  TECHNIQUE:  Multiplanar, multiecho pulse sequences of the brain and surrounding  structures, and cervical spine, to include the craniocervical  junction and cervicothoracic junction, were obtained without and  with intravenous contrast.  CONTRAST: 20 mL MultiHance.  COMPARISON: Lumbar MRI 12/10/2013.  FINDINGS:  MRI HEAD FINDINGS  Cerebral volume is normal. No restricted diffusion to suggest acute  infarction. No midline shift, mass effect, evidence of mass lesion,  ventriculomegaly, extra-axial collection or acute intracranial  hemorrhage. Cervicomedullary junction and pituitary are within  normal limits. Major intracranial vascular flow voids are preserved.  Pearline Cables and white matter signal is within normal limits for age  throughout the brain. No abnormal enhancement identified, pulsation  artifact prominent on axial post-contrast images.  Large body habitus. Small right frontal scalp lipoma (series 10,  image 37). Visible internal auditory structures appear normal. Mild  right mastoid fluid. Minor paranasal sinus mucosal thickening.  Visualized orbit soft tissues are within normal limits. Partially  visible thyromegaly greater on the left, see below. Normal bone  marrow signal.  MRI CERVICAL SPINE FINDINGS  Large body habitus resulting in wrap artifact on axial images.  However, there is massive thyroid enlargement evident, with the left  lobe measuring at least 7.2 x 5.2 x 11.2 cm (AP by transverse by  CC). The trachea is displaced to the right at the thoracic inlet and  mildly compressed. There is other regional mass effect including  sheath. On the left carotid  Fairly preserved cervical lordosis. No marrow edema or evidence of  acute osseous abnormality. Cervicomedullary junction is within  normal limits. Spinal cord signal is within normal limits at all  visualized levels. No  abnormal intradural enhancement.  C2-C3: Negative.  C3-C4: Disc osteophyte complex, facet  and ligament flavum  hypertrophy. Along with a degree of congenital canal narrowing,  there is mild spinal stenosis and moderate biforaminal stenosis.  C4-C5: Mild uncovertebral and facet hypertrophy. Mild right C5  foraminal stenosis.  C5-C6: Circumferential disc osteophyte complex. Along with a degree  of congenital canal narrowing there is mild spinal stenosis.  Moderate right C6 foraminal stenosis.  C6-C7: Negative except for mild facet hypertrophy.  C7-T1: Negative.  T1-T2: Negative.  Faintly visualized at the T3 level in the upper thoracic spine there  is a lobulated solidly enhancing mass which appears to extend into  the epidural space from the T3 vertebral body. This is inseparable  from the spinal cord which is likely compressed. See series 2000  image 7 and series 1500, image 6. This is not included on axial  images.  IMPRESSION:  1. Faintly visible but aggressive appearing enhancing mass at the T3  level of the upper thoracic spine, likely with spinal cord  compression evaluate further are with thoracic MRI without and with  contrast.  2. Normal for age MRI appearance of the brain.  3. Massive thyroid enlargement, greater on the left. Left thyroid  goiter/mass measuring at least 7.2 x 5.2 x 11.2 cm. Mass effect at  the thoracic inlet including on the trachea.  4. Mild chronic disc and endplate degeneration in the cervical spine  at C3-C4 and C5-C6 resulting in borderline to mild spinal stenosis,  but no cervical spinal cord mass effect or signal abnormality.  Electronically Signed:  By: Lars Pinks M.D.  On: 12/14/2013 09:50   MRI L-Spine 9/19:  CLINICAL DATA: Back pain with bilateral leg numbness, worsening  with oral steroids.  EXAM:  MRI LUMBAR SPINE WITHOUT CONTRAST  TECHNIQUE:  Multiplanar, multisequence MR imaging of the lumbar spine was  performed. No intravenous  contrast was administered.  COMPARISON: CT 12/09/2013  FINDINGS:  There is no degenerative disease at L2-3 or above. The discs are  normal in signal characteristics and morphology. The spinal canal  and foramina are widely patent. The distal cord and conus are normal  with conus tip at L1. There are small benign and insignificant  hemangiomas within the L2 and L3 vertebral bodies.  L3-4: Minimal desiccation of the disc. Mild circumferential bulging  slightly more prominent towards the left. Bilateral facet  degeneration and hypertrophy. Mild narrowing of the lateral recesses  without apparent neural compression. Mild foraminal narrowing on the  left without apparent neural compression.  L4-5: Desiccation of the disc with circumferential protrusion.  Bilateral facet and ligamentous hypertrophy. Stenosis of both  lateral recesses without definite focal neural compression. Mild  foraminal narrowing bilaterally.  L5-S1: Desiccation of the disc with loss of height. Endplate  osteophytes and bulging of the disc more prominent towards the left.  Bilateral facet degeneration and hypertrophy left more than right.  Stenosis of the subarticular lateral recess on the left that could  compress the S1 nerve root. Mild foraminal narrowing left more than  right but without apparent compression of the exiting L5 nerve  roots.  Serpiginous fluid-filled structure in the left pelvis could  represent hydrosalpinx. This is not primarily or completely imaged.  IMPRESSION:  Degenerative disc disease and degenerative facet disease in the  lower lumbar spine that could be a cause of lumbago.  Lateral recess and foraminal narrowing at L3-4, L4-5 and L5-S1 as  described above that could be a cause of focal neural compression.  In general, this is more pronounced on the  left than on the right.  In particular, there is potential for left S1 nerve root compression  in the subarticular lateral recess at L5-S1.   Electronically Signed  By: Nelson Chimes M.D.  On: 12/10/2013 08:45   CT L-Spine 9/18:  CLINICAL DATA: Low back pain with difficulty walking.  EXAM:  CT LUMBAR SPINE WITHOUT CONTRAST  TECHNIQUE:  Multidetector CT imaging of the lumbar spine was performed without  intravenous contrast administration. Multiplanar CT image  reconstructions were also generated.  COMPARISON: None.  FINDINGS:  There is no fracture or spondylolisthesis. There is moderately  severe disc space narrowing at L5-S1 with slight vacuum phenomenon  at this level. There is moderate narrowing at L4-5. There are  prominent anterior osteophytes at L2, L3, L4, and L5.  At T12-L1, there is moderate facet hypertrophy bilaterally. There is  no nerve root edema or effacement. No disc extrusion or stenosis.  At L1-2, there is moderate facet hypertrophy bilaterally. There is  mild diffuse disc bulging. There is no nerve root edema or  effacement. No disc extrusion or stenosis.  At L2-3, there is broad-based disc bulging and moderate facet  hypertrophy bilaterally. There is borderline narrowing of the thecal  sac with borderline spinal stenosis. No disc extrusion.  At L3-4, there is severe facet osteoarthritic change bilaterally,  more pronounced on the left than on the right. There is broad-based  disc bulging and moderate ligamentum flavum hypertrophy. These  findings in concert lead to moderate generalized spinal stenosis.  At L4-5, there is broad-based disc protrusion and severe facet  osteoarthritic change bilaterally. These findings in concert lead to  moderate generalized spinal stenosis. No frank disc extrusion.  At L5-S1, there is severe facet osteoarthritic change on the left  and moderate facet osteoarthritic change on the right. There is  broad-based disc protrusion. There is exit foraminal narrowing  bilaterally, more severe on the left than on the right, due to  osteophytic change. No frank disc extrusion or  stenosis is seen at  this level.  There are no paraspinous lesions.  IMPRESSION:  Spinal stenosis at L3-4 and L4-5, multifactorial. Borderline spinal  stenosis at L2-3. There does appear to be a degree of congenital  narrowing of the canal which contributes to spinal stenosis.  Multilevel arthropathy.  No frank disc extrusion.  No fracture or spondylolisthesis.  Electronically Signed  By: Lowella Grip M.D.  On: 12/09/2013 13:50   CXR 9/13:  CLINICAL DATA: Upper back pain for 5 weeks. Dyspnea on exertion.  Nonsmoker. History of hypertension.  EXAM:  CHEST 2 VIEW  COMPARISON: 04/15/2006  FINDINGS:  The heart is mildly enlarged. There is elevation of right  hemidiaphragm, stable in appearance. There is streaky density  favored to be within the right lower lobe, consistent with  atelectasis or infiltrate. No pulmonary edema. No pleural effusions.  There are moderate degenerative changes in the thoracic spine.  IMPRESSION:  Right lower lobe atelectasis or infiltrate.  Electronically Signed  By: Shon Hale M.D.  On: 12/04/2013 19:26  Venous Duplex 9/24:  Summary:  - No evidence of deep vein thrombosis involving the right lower extremity. - No evidence of deep vein thrombosis involving the left lower extremity.   Surgical Pathology Report: 12/21/2013 Diagnosis Soft tissue mass, simple excision, Thoracic Epidural Tumor - METASTATIC CARCINOMA. PLEASE SEE COMMENT. Microscopic Comment There are clusters of malignant cells with abundant eosinophilic cytoplasm. A battery of immunostains were performed and the tumor cells show the following profile: Cytokeratin 7 -  strongly positive Cytokeratin 8/18 - strongly positive TTF-1 - strongly positive. Thyroglobulin - negative CD10 - negative Cytokeratin 20 - negative S100 - negative Vimentin - negative The control stained appropriately. The overall findings are diagnostic for metastatic carcinoma. Given the positivity of  tumor cells for TTF-1 and negativity for Thyroglobulin, a diagnosis of metastatic adenocarcinoma of lung primary is favored. However, a metastatic oncocytic thyroid carcinoma is still in the differential. Clinical and radiographic correlation is highly recommended. (HCL:kh 12-19-13) Aldona Bar MD Pathologist, Electronic Signature (Case signed 12/20/2013)  Elberta Leatherwood, MD 01/14/2014, 10:51 AM PGY-1 [facility] Intern pager: [phone], text pages welcome

## 2014-01-15 LAB — CBC
HCT: 25.2 % — ABNORMAL LOW (ref 36.0–46.0)
HEMOGLOBIN: 8.4 g/dL — AB (ref 12.0–15.0)
MCH: 28.8 pg (ref 26.0–34.0)
MCHC: 33.3 g/dL (ref 30.0–36.0)
MCV: 86.3 fL (ref 78.0–100.0)
Platelets: 316 10*3/uL (ref 150–400)
RBC: 2.92 MIL/uL — AB (ref 3.87–5.11)
RDW: 15 % (ref 11.5–15.5)
WBC: 4.8 10*3/uL (ref 4.0–10.5)

## 2014-01-15 LAB — GLUCOSE, CAPILLARY
GLUCOSE-CAPILLARY: 112 mg/dL — AB (ref 70–99)
GLUCOSE-CAPILLARY: 176 mg/dL — AB (ref 70–99)
Glucose-Capillary: 155 mg/dL — ABNORMAL HIGH (ref 70–99)
Glucose-Capillary: 148 mg/dL — ABNORMAL HIGH (ref 70–99)

## 2014-01-15 LAB — APTT: aPTT: 61 seconds — ABNORMAL HIGH (ref 24–37)

## 2014-01-15 MED ORDER — ARGATROBAN 50 MG/50ML IV SOLN
0.7500 ug/kg/min | INTRAVENOUS | Status: DC
  Administered 2014-01-15 – 2014-01-16 (×3): 0.75 ug/kg/min via INTRAVENOUS
  Filled 2014-01-15 (×5): qty 50

## 2014-01-15 MED ORDER — INSULIN ASPART 100 UNIT/ML ~~LOC~~ SOLN: 0.0000 [IU] | Freq: Every day | SUBCUTANEOUS | Status: DC

## 2014-01-15 MED ORDER — INSULIN ASPART 100 UNIT/ML ~~LOC~~ SOLN
0.0000 [IU] | Freq: Three times a day (TID) | SUBCUTANEOUS | Status: DC
  Administered 2014-01-15: 3 [IU] via SUBCUTANEOUS
  Administered 2014-01-15: 17:00:00 via SUBCUTANEOUS
  Administered 2014-01-16 – 2014-01-19 (×3): 2 [IU] via SUBCUTANEOUS

## 2014-01-15 MED ORDER — LEVOTHYROXINE SODIUM 50 MCG PO TABS
250.0000 ug | ORAL_TABLET | Freq: Every day | ORAL | Status: DC
  Administered 2014-01-16 – 2014-01-19 (×4): 250 ug via ORAL
  Filled 2014-01-15: qty 1
  Filled 2014-01-15 (×3): qty 2
  Filled 2014-01-15 (×3): qty 1

## 2014-01-15 MED ORDER — ENOXAPARIN SODIUM 150 MG/ML ~~LOC~~ SOLN: 1.0000 mg/kg | Freq: Two times a day (BID) | SUBCUTANEOUS | Status: DC

## 2014-01-15 NOTE — Progress Notes (Signed)
Family Medicine Teaching Service Daily Progress Note Intern Pager: [phone]  Patient name: Sarah [name] Medical record number: [id number] Date of birth: 12-10-1965 Age: 48 y.o. Gender: female  Primary Care Provider: No PCP Per Patient Consultants: Neurosurgery, IR, Oncology, Endocrinology Code Status: Full  Pt Overview and Major Events to Date:  - 9/22: Pt. Admitted with lower extremity weakness.  - 9/23: Pt. Found to have mass at T-3 level with spinal compression.  - 9/24: Surgery performed for decompression and biopsy overnight. Pt. Post-op and doing well. 3cm Suprahilar mass on CT Chest, PE also identified. ?Thyroid etiology, but much less likely for mets. Oncology consulted.  - 9/25 - 9/29: Awaiting pathology results with plan for oncology to see once path returns. Pt. With little neurological improvement. Started with light PT.  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 Oncology on board. Neurosurgery to comment on need for spinal stabilization surgery.  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 - 10/8: Underwent spinal stabilization surg. - 10/10: Restarted heparin  - 10/13: Dr. GerkiHarlow Asale to perform surg as inpatient; will seek other consultations - 10/14: Dr. RosenConstance HolsterNT consulted. He will see pt on 10/15. - 10/16: ENT to perform Thyroidectomy Monday 10/19 - 10/17: All cell lines (WBC, Hgb, Plts) continue to drop: Checking Labs as below - 10/18: Midnight, holding heparin in preparation for surgery 10/19 - 10/19: Total thyroidectomy - 10/22: [facility]  Assessment and Plan: Sarah [name] 48 y.66 female presenting with continued lower extremity weakness  at baseline after discharge from hospital on 9/21. PMH is significant for obesity, HTN, and ?History of Hypothyroidism previously on Synthroid per patient.   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  Neurosurgery took her to the OR on 9/23, and biopsied as well as performed laminectomy for decompression. Surgical pathology with metastatic adenocarcinoma. Likely poor prognosis.  - Neurosurgery (Dr. CabbeChristella Noalowing: Patient was taken for spinal stabilization 10/9.  - Oncology following (Dr. MagriJana Hakimpatient; Dr. 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 Large thyroid goiter noted. TSH normal; ultrasound on 10/1, Biopsy on 10/2 - FNA results: "clusters of neoplastic cells w/ granular/oncogenic cytoplasm--similar to previous bx" - Dr. RosenConstance HolsterT) following. Greatly appreciate the input. - Endocrinology note with great recs (Dr. Kerr)Buddy Dutyill need to begin radioiodine therapy and external beam radiation when deemed appropriate by oncology.  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  PT recommendations CIR vs SNF with 24hour supervision. Will plan dispo based on goals of care discussion. - Foley catheter in place for urinary retention; Stooling regularly, no sxs of fecal retention.  - Discussed Recs for PT with Neurosurgery. Light PT at this point. Pt. Not to get UOB.  - TED hose  #  Pulmonary Embolus - Found on CT Chest  Pt. With brief episodes of tachycardia, but otherwise asymptomatic with O2 sats 100% on 2L [place] >> now on Room Air.   Blood pressures stable. Lower extremity Venous Duplex negative for DVT.  - Hypercoagulable 2/2 neoplasia . ESR 59 (9/22)  - Now on Argatroban. Consider transitioning to Lovenox today - Will continue to monitor VS. Stable at this time.  # Anemia and Thrombocytopenia: Improving. Hgb and Plts have been trending down slowly since spinal stabilization surgery on 10/7, now uptrending.  No signs of end organ damage so low suspicion for TTP, DIC - FOBT - negative - Continue to follow CBC - HIT panel: neg - Argatroban; consider transition to Lovenox today - Will consider Iron, TIBC, B12, and folate if Hgb downtrend continues  #Hyperglycemia -  Likely diabetic with PCOS given A1C of 6.7. CBG's 110s-140s.  - Patient informed of elevated A1C and likely diagnosis of DMII. She agrees with [place] that this is a marginal issue at this point, and we will recommend treatment based upon our goals of care discussion.  - Continue to monitor.  - Resistant SSI - may need to decrease to mod SSI  #Hypervirilization - with clitoromegaly noted on prior exam and buffalo hump and obesity as well as upper lip hair. PCOS vs. Adrenal patholog. Hypertensive and not hypotensive. Testing during this hospitalization after having recently received steroids would be largely non-diagnostic. Though, could also be a combination of isolated processes i.e. Clitoromegaly, Hirsutism 2/2 obesity. - Will leave for Outpatient f/u -- [place] to assess ovaries vs 21-hydroxylase/ACTH/cortisol.  - TSH, Cortisol WNL - A1C : 6.7 - consistent with PCOS given evidence of cystic structures in pelvis.   # UTI, 2/2 foley catheter - (10/2)UCx = Proteus - Resolved s/p 7d course of Cipro; (10/20)UCx = E Coli, resistant to cipro - dysuria and suprapubic pain noted PM 10/1. - 10/23 started on Keflex x 7 days  #HTN- BP well controlled -continue losartan.   #Obesity- consistent with potential PCOS.   FEN/GI: Reg diet; KVO Prophylaxis: Lovenox,  SCDs  Disposition: Pending specialist input, likely to CIR this week.  Subjective: [name] doing well. Reports significant improvement and motivation w/ physical therapy. Patient may be regaining some motor function in BLE.   Objective: Temp:  [97.5 F (36.4 C)-99.1 F (37.3 C)] 97.5 F (36.4 C) (10/25 0952) Pulse Rate:  [74-88] 83 (10/25 0952) Resp:  [18-20] 18 (10/25 0952) BP: (124-143)/(70-81) 127/80 mmHg (10/25 0952) SpO2:  [98 %-100 %] 100 % (10/25 0349) Physical Exam: General: NAD, AAOx3, morbidly obese  HEENT: AT/[place],  neck supple, surgical incision noted w/o concern for infection Cardio: distant heart sounds due to habitus Lungs: Normal effort, CTA bilateral anteriorly. Abdomen: Soft and nontender, nondistended, No organomegaly noted.  Extremities: no edema,  No calf tenderness Skin: No lesions or rashes noted Neuro: cranial nerves grossly normal. Normal speech; showing improvement each day. Moves legs during exam.  Laboratory:  Recent Labs Lab 01/13/14 0855 01/14/14 0501 01/15/14 0520  WBC 3.7* 4.2 4.8  HGB 8.2* 8.1* 8.4*  HCT 24.9* 24.6* 25.2*  PLT 236 280 316    Recent Labs Lab 01/09/14 2102  01/12/14 0505 01/13/14 0855 01/14/14 0501  NA 134*  < > 138 142 141  K 4.2  < > 3.6* 3.7 3.3*  CL 95*  < > 98 101 101  CO2 24  < > _0 BUN 9  < > 15 9 7  CREATININE 0.77  < > 0.69 0.69 0.76  CALCIUM 8.6  8.4  < > 8.2* 8.0* 7.8*  PROT 5.6*  --   --   --  5.5*  BILITOT 1.6*  --   --   --  0.5  ALKPHOS 329*  --   --   --  140*  ALT 45*  --   --   --  18  AST 39*  --   --   --  18  GLUCOSE 212*  < > 108* 109* 110*  < > = values in this interval not displayed. Urinalysis    Component Value Date/Time   COLORURINE YELLOW 01/09/2014 2215   APPEARANCEUR TURBID* 01/09/2014 2215   LABSPEC 1.004* 01/09/2014 2215   PHURINE 7.5 01/09/2014 2215   GLUCOSEU NEGATIVE 01/09/2014 2215   HGBUR LARGE* 01/09/2014 2215   BILIRUBINUR NEGATIVE 01/09/2014 2215    KETONESUR 15* 01/09/2014 2215   PROTEINUR NEGATIVE 01/09/2014 2215   UROBILINOGEN 0.2 01/09/2014 2215   NITRITE POSITIVE* 01/09/2014 2215   LEUKOCYTESUR LARGE* 01/09/2014 2215    Phos - 3.9 Mg - 2.2 CRP - 11.6 ESR - 59 CK - 56 B12 - 209 Random Cortisol - 25.4 A1C - 6.7 TSH - 2.13 RPR - nonreactive HIV - nonreactive UPT - Negative  Imaging/Diagnostic Tests:  CT Chest Abd/ Pelvis 9/23:  CLINICAL DATA: Destructive metastatic lesion of the T3 vertebral  body with no known primary carcinoma.  EXAM:  CT CHEST, ABDOMEN, AND PELVIS WITH CONTRAST  TECHNIQUE:  Multidetector CT imaging of the chest, abdomen and pelvis was  performed following the standard protocol during bolus  administration of intravenous contrast.  CONTRAST: 123m OMNIPAQUE IOHEXOL 300 MG/ML SOLN  COMPARISON: MRI of the cervical spine earlier today.  FINDINGS:  CT CHEST FINDINGS  Within the chest, a right suprahilar mass measures approximately 3.0  x 2.7 x 2.2 cm. This either represents a nodal metastasis or central  lung mass such as small cell carcinoma. A number of smaller  mediastinal lymph nodes are also identified in the right  paratracheal region, prevascular space and AP window.  Destructive lesion of the T3 vertebral body noted which nearly  replaces the entire vertebral body with tumor extension posteriorly  into the epidural space causing significant compression of the  spinal cord. Paravertebral extension of tumor is also suspected  lateral and anterior to the vertebral body, right greater than left.  Massive enlargement of the thyroid gland present with transverse  dimensions of approximately 7.7 x 8.5 cm. The thyroid gland is  incompletely visualized as the neck was not included. Central  dystrophic calcification identified. The enlarged thyroid gland does  cause significant mass effect on the airway with evidence of  tracheal deviation to the right and tracheal narrowing at the  thoracic  inlet. The enlarged thyroid gland extends into the superior  mediastinum.  There is evidence of pulmonary embolism with nonocclusive thrombus  identified in the right middle lobe and lower lobe pulmonary  arteries. Pulmonary arterial opacification is not optimal on the  standard venous phase evaluation of the chest but the right sided  pulmonary embolism is clearly present.  There are small bilateral pulmonary nodules. Right-sided pulmonary  nodule measuring 6 mm likely localizes to the very top of the lower  lobe near the major fissure. Left upper lobe nodule measures 7 mm.  Lingular nodule measures 7 mm. Left lower lobe nodule measures 7 mm.  Faint peripheral right middle lobe nodule measures 3  mm. Inferior  right middle lobe nodule measures 6 mm. Several small nodules in the  posterior right upper lobe identified in the 3-4 mm range.  No evidence of pleural or pericardial fluid. No other bony lesions  identified in the thorax.  CT ABDOMEN AND PELVIS FINDINGS  Small low-density lesion in the left lobe of the liver measures 7 mm  and likely represents a cyst. No other hepatic abnormalities. The  gallbladder, pancreas, spleen, adrenal glands and kidneys are  unremarkable. No adenopathy is identified in the abdomen or pelvis.  Bowel shows normal caliber without evidence of obstruction, lesion  or inflammation. No abnormal fluid collections.  The bladder is moderately distended. Elongated cystic changes in  both adnexal regions identified which may represent a combination of  ovarian cysts and potentially hydrosalpinx. Correlation with pelvic  ultrasound is recommended. No hernias are identified. The uterus  appears unremarkable by CT. No bony lesions are identified in the  abdomen or pelvis.  IMPRESSION:  1. 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  2. 3 cm  right suprahilar mass may represent nodal metastasis versus  primary central tumor. Next number multiple small bilateral  pulmonary nodules consistent with metastatic disease.  3. Acute nonocclusive pulmonary embolism in the right middle lobe  and right lower lobe pulmonary arteries.  4. Massive thyroid goiter causing tracheal deviation to the right  and narrowing of the trachea.  5. Tubular cystic abnormalities of both adnexal regions which may be  a combination of cystic changes in the ovaries and potentially  hydrosalpinx. Correlation recommended with pelvic ultrasound to  exclude any suspicious cystic lesions of the adnexae.  6. Critical Value/emergent results were called by telephone at the  time of interpretation on 12/14/2013 at 4:52 pm to Dr. Tamala Julian of the  [facility] Medicine Teaching Service, who verbally acknowledged these  results.  Electronically Signed  By: Aletta Edouard M.D.  On: 12/14/2013 16:53   MRI T-spine 9/23:  CLINICAL DATA: Lower extremity weakness. Worsening back pain.  Compressive upper thoracic mass partially visualized on cervical  spine MRI as well as chest CT with spinal cord compression.  EXAM:  MRI THORACIC SPINE WITHOUT CONTRAST  TECHNIQUE:  Multiplanar, multisequence MR imaging of the thoracic spine was  performed. No intravenous contrast was administered.  COMPARISON: Cervical spine MRI and chest CT earlier today  FINDINGS:  There is a T3 compression fracture with approximately 50% height  loss centrally. As described on cervical spine MRI and chest CT  earlier today, there is abnormal soft tissue replacing the majority  of the T3 vertebral body extending into the pedicles as well as into  the ventral epidural space with moderate cord compression. Epidural  extension is slightly greater on the right and extends into the  inferior aspect of the right T2-3 neural foramen as well as superior  aspect of the right T3-4 neural foramen. There is also  abnormal  paravertebral soft tissue laterally and anteriorly as seen on CT.  Central ventral epidural material posterior to the T4 vertebral body  may represent tumor or hematoma. Mild T2 hyperintensity is present  in the spinal cord at the T3 level extending inferiorly to the T4-5  disc space level consistent with mild edema. Mild edema is present  in the posterior paraspinal soft tissues of the upper thoracic  spine. No other vertebral body lesions are identified.  There  is a moderate-sized central disc extrusion at T7-8 which  contacts and mildly flattens the ventral spinal cord with mild  spinal stenosis. A small left central disc protrusion is present at  T8-9 with at most minimal impression on the spinal cord. Massive  enlargement of the left thyroid lobe is partially visualized.  Mediastinal and lung findings are better demonstrated on recent  chest CT.  IMPRESSION:  1. T3 vertebral body mass with pathologic compression fracture and  epidural tumor resulting in moderate cord compression with evidence  of mild spinal cord edema.  2. T7-8 disc extrusion resulting in mild spinal stenosis.   MRI Head / C-Spine 9/23:  CLINICAL DATA: 48 year old female who presented with back pain and  lower extremity numbness, with continued inadequately explained  lower extremity weakness following Hospital discharge on 12/12/2013.  Initial encounter.  EXAM:  MRI HEAD WITHOUT AND WITH CONTRAST  MRI CERVICAL SPINE WITHOUT AND WITH CONTRAST  TECHNIQUE:  Multiplanar, multiecho pulse sequences of the brain and surrounding  structures, and cervical spine, to include the craniocervical  junction and cervicothoracic junction, were obtained without and  with intravenous contrast.  CONTRAST: 20 mL MultiHance.  COMPARISON: Lumbar MRI 12/10/2013.  FINDINGS:  MRI HEAD FINDINGS  Cerebral volume is normal. No restricted diffusion to suggest acute  infarction. No midline shift, mass effect, evidence of  mass lesion,  ventriculomegaly, extra-axial collection or acute intracranial  hemorrhage. Cervicomedullary junction and pituitary are within  normal limits. Major intracranial vascular flow voids are preserved.  Pearline Cables and white matter signal is within normal limits for age  throughout the brain. No abnormal enhancement identified, pulsation  artifact prominent on axial post-contrast images.  Large body habitus. Small right frontal scalp lipoma (series 10,  image 37). Visible internal auditory structures appear normal. Mild  right mastoid fluid. Minor paranasal sinus mucosal thickening.  Visualized orbit soft tissues are within normal limits. Partially  visible thyromegaly greater on the left, see below. Normal bone  marrow signal.  MRI CERVICAL SPINE FINDINGS  Large body habitus resulting in wrap artifact on axial images.  However, there is massive thyroid enlargement evident, with the left  lobe measuring at least 7.2 x 5.2 x 11.2 cm (AP by transverse by  CC). The trachea is displaced to the right at the thoracic inlet and  mildly compressed. There is other regional mass effect including  sheath. On the left carotid  Fairly preserved cervical lordosis. No marrow edema or evidence of  acute osseous abnormality. Cervicomedullary junction is within  normal limits. Spinal cord signal is within normal limits at all  visualized levels. No abnormal intradural enhancement.  C2-C3: Negative.  C3-C4: Disc osteophyte complex, facet and ligament flavum  hypertrophy. Along with a degree of congenital canal narrowing,  there is mild spinal stenosis and moderate biforaminal stenosis.  C4-C5: Mild uncovertebral and facet hypertrophy. Mild right C5  foraminal stenosis.  C5-C6: Circumferential disc osteophyte complex. Along with a degree  of congenital canal narrowing there is mild spinal stenosis.  Moderate right C6 foraminal stenosis.  C6-C7: Negative except for mild facet hypertrophy.  C7-T1:  Negative.  T1-T2: Negative.  Faintly visualized at the T3 level in the upper thoracic spine there  is a lobulated solidly enhancing mass which appears to extend into  the epidural space from the T3 vertebral body. This is inseparable  from the spinal cord which is likely compressed. See series 2000  image 7 and series 1500, image 6. This is not included on axial  images.  IMPRESSION:  1. Faintly visible but aggressive appearing enhancing mass at the T3  level of the upper thoracic spine, likely with spinal cord  compression evaluate further are with thoracic MRI without and with  contrast.  2. Normal for age MRI appearance of the brain.  3. Massive thyroid enlargement, greater on the left. Left thyroid  goiter/mass measuring at least 7.2 x 5.2 x 11.2 cm. Mass effect at  the thoracic inlet including on the trachea.  4. Mild chronic disc and endplate degeneration in the cervical spine  at C3-C4 and C5-C6 resulting in borderline to mild spinal stenosis,  but no cervical spinal cord mass effect or signal abnormality.  Electronically Signed:  By: Lars Pinks M.D.  On: 12/14/2013 09:50   MRI L-Spine 9/19:  CLINICAL DATA: Back pain with bilateral leg numbness, worsening  with oral steroids.  EXAM:  MRI LUMBAR SPINE WITHOUT CONTRAST  TECHNIQUE:  Multiplanar, multisequence MR imaging of the lumbar spine was  performed. No intravenous contrast was administered.  COMPARISON: CT 12/09/2013  FINDINGS:  There is no degenerative disease at L2-3 or above. The discs are  normal in signal characteristics and morphology. The spinal canal  and foramina are widely patent. The distal cord and conus are normal  with conus tip at L1. There are small benign and insignificant  hemangiomas within the L2 and L3 vertebral bodies.  L3-4: Minimal desiccation of the disc. Mild circumferential bulging  slightly more prominent towards the left. Bilateral facet  degeneration and hypertrophy. Mild narrowing of the  lateral recesses  without apparent neural compression. Mild foraminal narrowing on the  left without apparent neural compression.  L4-5: Desiccation of the disc with circumferential protrusion.  Bilateral facet and ligamentous hypertrophy. Stenosis of both  lateral recesses without definite focal neural compression. Mild  foraminal narrowing bilaterally.  L5-S1: Desiccation of the disc with loss of height. Endplate  osteophytes and bulging of the disc more prominent towards the left.  Bilateral facet degeneration and hypertrophy left more than right.  Stenosis of the subarticular lateral recess on the left that could  compress the S1 nerve root. Mild foraminal narrowing left more than  right but without apparent compression of the exiting L5 nerve  roots.  Serpiginous fluid-filled structure in the left pelvis could  represent hydrosalpinx. This is not primarily or completely imaged.  IMPRESSION:  Degenerative disc disease and degenerative facet disease in the  lower lumbar spine that could be a cause of lumbago.  Lateral recess and foraminal narrowing at L3-4, L4-5 and L5-S1 as  described above that could be a cause of focal neural compression.  In general, this is more pronounced on the left than on the right.  In particular, there is potential for left S1 nerve root compression  in the subarticular lateral recess at L5-S1.  Electronically Signed  By: Nelson Chimes M.D.  On: 12/10/2013 08:45   CT L-Spine 9/18:  CLINICAL DATA: Low back pain with difficulty walking.  EXAM:  CT LUMBAR SPINE WITHOUT CONTRAST  TECHNIQUE:  Multidetector CT imaging of the lumbar spine was performed without  intravenous contrast administration. Multiplanar CT image  reconstructions were also generated.  COMPARISON: None.  FINDINGS:  There is no fracture or spondylolisthesis. There is moderately  severe disc space narrowing at L5-S1 with slight vacuum phenomenon  at this level. There is moderate  narrowing at L4-5. There are  prominent anterior osteophytes at L2, L3, L4, and L5.  At T12-L1, there is moderate facet  hypertrophy bilaterally. There is  no nerve root edema or effacement. No disc extrusion or stenosis.  At L1-2, there is moderate facet hypertrophy bilaterally. There is  mild diffuse disc bulging. There is no nerve root edema or  effacement. No disc extrusion or stenosis.  At L2-3, there is broad-based disc bulging and moderate facet  hypertrophy bilaterally. There is borderline narrowing of the thecal  sac with borderline spinal stenosis. No disc extrusion.  At L3-4, there is severe facet osteoarthritic change bilaterally,  more pronounced on the left than on the right. There is broad-based  disc bulging and moderate ligamentum flavum hypertrophy. These  findings in concert lead to moderate generalized spinal stenosis.  At L4-5, there is broad-based disc protrusion and severe facet  osteoarthritic change bilaterally. These findings in concert lead to  moderate generalized spinal stenosis. No frank disc extrusion.  At L5-S1, there is severe facet osteoarthritic change on the left  and moderate facet osteoarthritic change on the right. There is  broad-based disc protrusion. There is exit foraminal narrowing  bilaterally, more severe on the left than on the right, due to  osteophytic change. No frank disc extrusion or stenosis is seen at  this level.  There are no paraspinous lesions.  IMPRESSION:  Spinal stenosis at L3-4 and L4-5, multifactorial. Borderline spinal  stenosis at L2-3. There does appear to be a degree of congenital  narrowing of the canal which contributes to spinal stenosis.  Multilevel arthropathy.  No frank disc extrusion.  No fracture or spondylolisthesis.  Electronically Signed  By: Lowella Grip M.D.  On: 12/09/2013 13:50   CXR 9/13:  CLINICAL DATA: Upper back pain for 5 weeks. Dyspnea on exertion.  Nonsmoker. History of hypertension.   EXAM:  CHEST 2 VIEW  COMPARISON: 04/15/2006  FINDINGS:  The heart is mildly enlarged. There is elevation of right  hemidiaphragm, stable in appearance. There is streaky density  favored to be within the right lower lobe, consistent with  atelectasis or infiltrate. No pulmonary edema. No pleural effusions.  There are moderate degenerative changes in the thoracic spine.  IMPRESSION:  Right lower lobe atelectasis or infiltrate.  Electronically Signed  By: Shon Hale M.D.  On: 12/04/2013 19:26  Venous Duplex 9/24:  Summary:  - No evidence of deep vein thrombosis involving the right lower extremity. - No evidence of deep vein thrombosis involving the left lower extremity.   Surgical Pathology Report: 12/21/2013 Diagnosis Soft tissue mass, simple excision, Thoracic Epidural Tumor - METASTATIC CARCINOMA. PLEASE SEE COMMENT. Microscopic Comment There are clusters of malignant cells with abundant eosinophilic cytoplasm. A battery of immunostains were performed and the tumor cells show the following profile: Cytokeratin 7 - strongly positive Cytokeratin 8/18 - strongly positive TTF-1 - strongly positive. Thyroglobulin - negative CD10 - negative Cytokeratin 20 - negative S100 - negative Vimentin - negative The control stained appropriately. The overall findings are diagnostic for metastatic carcinoma. 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 However, a metastatic oncocytic thyroid carcinoma is still in the differential. Clinical and radiographic correlation is highly recommended. (HCL:kh 12-19-13) Aldona Bar MD Pathologist, Electronic Signature (Case signed 12/20/2013)  Lavon Paganini, MD 01/15/2014, 10:43 AM PGY-1 [facility] pager: [phone], text pages welcome

## 2014-01-15 NOTE — Progress Notes (Signed)
ANTICOAGULATION CONSULT NOTE - Follow Up Consult  Pharmacy Consult for argatroban Indication: PE   Allergies  Allergen Reactions  . Tramadol Nausea And Vomiting    Patient Measurements: Height: 5\' 11"  (180.3 cm) Weight: 313 lb 15 oz (142.4 kg) IBW/kg (Calculated) : 70.8   Vital Signs: Temp: 97.5 F (36.4 C) (10/25 0952) Temp Source: Oral (10/25 0952) BP: 127/80 mmHg (10/25 0952) Pulse Rate: 83 (10/25 0952)  Labs:  Recent Labs  01/13/14 0855 01/14/14 0501  01/14/14 1416 01/14/14 1816 01/15/14 0520  HGB 8.2* 8.1*  --   --   --  8.4*  HCT 24.9* 24.6*  --   --   --  25.2*  PLT 236 280  --   --   --  316  APTT 58* 34  < > 52* 60* 61*  CREATININE 0.69 0.76  --   --   --   --   < > = values in this interval not displayed.  Estimated Creatinine Clearance: 136.4 ml/min (by C-G formula based on Cr of 0.76).   Medications:  Scheduled:  . antiseptic oral rinse  7 mL Mouth Rinse QID  . cephALEXin  500 mg Oral QID  . Influenza vac split quadrivalent PF  0.5 mL Intramuscular Tomorrow-1000  . insulin aspart  0-15 Units Subcutaneous TID WC  . insulin aspart  0-5 Units Subcutaneous QHS  . [START ON 01/16/2014] levothyroxine  250 mcg Oral QAC breakfast  . senna  1 tablet Oral BID  . sodium chloride  10-40 mL Intracatheter Q12H    Assessment: 48 yo female with RLL PE now s/p thyroidectomy (POD#6). 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 APTT is therapeutic = 61 on 0.75 mcg/kg/min. hgb 8.4 low but stable, plt trending back up to 316 today.  Primary team is trying to decide switching patient to another long-term anticoagulation agent. HIT panel was done twice on  10/17 and 10/19 both with weak positive heparin antibodies, but negative 14C-serotonin release assay (SRA). 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 But I still concern lovenox might cause her plantlets to drop again. Oral anticoagulants such as coumadin, Xarelto, and Eliquis could  be an alternative, but since pt. Has cancer, oncology would prefer lovenox or Arixtra. Arixtra will be less likely to cause HIT. After discussing with residents, will continue Argatroban for now, and they will make final decision on long-term anticoagulation after discussing with oncologist on Monday.     Goal of Therapy:  APTT= 50-90 seconds Monitor platelets by anticoagulation protocol: Yes   Plan:  -. Contintinue Argatroban 0.75 mcg/kg/min = 6.4 ml/hr -. Daily aPTT and CBC -. F/u plan for long-term anticoagulation  Maryanna Shape, PharmD, BCPS  Clinical Pharmacist  Pager: [phone]  01/15/2014 11:17 AM

## 2014-01-15 NOTE — Progress Notes (Signed)
Patient ID: Sarah [name], female   DOB: May 26, 1965, 48 y.o.   MRN: [id number] BP 127/80  Pulse 83  Temp(Src) 97.5 F (36.4 C) (Oral)  Resp 18  Ht 5\' 11"  (1.803 m)  Wt 142.4 kg (313 lb 15 oz)  BMI 43.80 kg/m2  SpO2 100%  LMP 11/28/2013 Exam is unchanged Doing well overall. Will consult radiation oncology tomorrow.  Wound is clean, dry, no signs of infection.

## 2014-01-16 ENCOUNTER — Ambulatory Visit: Payer: Medicaid Other | Admitting: Radiation Oncology

## 2014-01-16 ENCOUNTER — Ambulatory Visit
Admit: 2014-01-16 | Discharge: 2014-01-16 | Disposition: A | Discharge status: Home / Self Care | Payer: Medicaid Other | Attending: Radiation Oncology | Admitting: Radiation Oncology

## 2014-01-16 ENCOUNTER — Ambulatory Visit: Payer: Medicaid Other

## 2014-01-16 DIAGNOSIS — C7951 Secondary malignant neoplasm of bone: Secondary | ICD-10-CM

## 2014-01-16 DIAGNOSIS — Z452 Encounter for adjustment and management of vascular access device: Secondary | ICD-10-CM

## 2014-01-16 LAB — CBC
HCT: 25.7 % — ABNORMAL LOW (ref 36.0–46.0)
Hemoglobin: 8.7 g/dL — ABNORMAL LOW (ref 12.0–15.0)
MCH: 29 pg (ref 26.0–34.0)
MCHC: 33.9 g/dL (ref 30.0–36.0)
MCV: 85.7 fL (ref 78.0–100.0)
Platelets: 331 10*3/uL (ref 150–400)
RBC: 3 MIL/uL — ABNORMAL LOW (ref 3.87–5.11)
RDW: 15 % (ref 11.5–15.5)
WBC: 5 10*3/uL (ref 4.0–10.5)

## 2014-01-16 LAB — GLUCOSE, CAPILLARY
GLUCOSE-CAPILLARY: 106 mg/dL — AB (ref 70–99)
GLUCOSE-CAPILLARY: 101 mg/dL — AB (ref 70–99)
Glucose-Capillary: 146 mg/dL — ABNORMAL HIGH (ref 70–99)
Glucose-Capillary: 94 mg/dL (ref 70–99)

## 2014-01-16 LAB — APTT: aPTT: 59 seconds — ABNORMAL HIGH (ref 24–37)

## 2014-01-16 MED ORDER — ENOXAPARIN SODIUM 150 MG/ML ~~LOC~~ SOLN
140.0000 mg | Freq: Two times a day (BID) | SUBCUTANEOUS | Status: DC
  Administered 2014-01-16: 140 mg via SUBCUTANEOUS
  Administered 2014-01-17 – 2014-01-18 (×4): via SUBCUTANEOUS
  Administered 2014-01-19: 140 mg via SUBCUTANEOUS
  Administered 2014-01-19: 17:00:00 via SUBCUTANEOUS
  Filled 2014-01-16 (×12): qty 1

## 2014-01-16 NOTE — Progress Notes (Signed)
ANTICOAGULATION CONSULT NOTE - Follow Up Consult  Pharmacy Consult:  Argatroban to Lovenox Indication:  PE  Allergies  Allergen Reactions  . Tramadol Nausea And Vomiting    Patient Measurements: Height: 5\' 11"  (180.3 cm) Weight: 313 lb 15 oz (142.4 kg) IBW/kg (Calculated) : 70.8  Vital Signs: Temp: 98.2 F (36.8 C) (10/26 1330) Temp Source: Oral (10/26 1330) BP: 138/77 mmHg (10/26 1330) Pulse Rate: 88 (10/26 1330)  Labs:  Recent Labs  01/14/14 0501  01/14/14 1816 01/15/14 0520 01/16/14 0547  HGB 8.1*  --   --  8.4* 8.7*  HCT 24.6*  --   --  25.2* 25.7*  PLT 280  --   --  316 331  APTT 34  < > 60* 61* 59*  CREATININE 0.76  --   --   --   --   < > = values in this interval not displayed.  Estimated Creatinine Clearance: 136.4 ml/min (by C-G formula based on Cr of 0.76).      Assessment: Sarah [name] with new RLL PE to continue on argatroban until long-term anticoagulation plan is decided.  APTT therapeutic and stable; no bleeding reported.  Although patient's SRA is negative.  Argatroban changed to Lovenox   Goal of Therapy:   Monitor platelets by anticoagulation protocol: Yes    Plan:  -DC Argatroban and labs -Lovenox 140 mg sq Q 12 hours -Follow CBC Q 72 hours  Thank you. Anette Guarneri, PharmD [phone] 01/16/2014, 3:16 PM

## 2014-01-16 NOTE — Progress Notes (Addendum)
Spoke with patient regarding Lovenox. Patient worried about weakly positive result on HIT panel. Per discussion between Dr. Alease Frame and Pharmacy, patient OK to start Lovenox. Reassured patient that her case was discussed and that per discussion with Pharmacy, there is a lower chance of a HIT reaction, but that we will continue to monitor her platelets. Patient seemed okay with this plan and will discuss further with Dr. Alease Frame in the morning. Patient would like to delay Lovenox until 1800 this evening when family comes by. Informed patient that she has the right to refuse if she feels uneasy about taking Lovenox. She voiced understanding and appreciated the time spent to speak with her.  Cordelia Poche, MD PGY-2, [facility] 01/16/2014, 5:39 PM

## 2014-01-16 NOTE — Progress Notes (Signed)
CM CONSULT Following patient for discharge planning; CIR contacted for placement, was informed that they did not see the patient and a consult was never placed/ pt not worked up for [place] rehab placement at discharge; Attending MD made aware; Soc worker following for SNF placement. CM following also; Aneta Mins [phone]

## 2014-01-16 NOTE — Progress Notes (Signed)
I discussed with  Dr Bacigalupo.  I agree with their plans documented in their progress note.  

## 2014-01-16 NOTE — Progress Notes (Signed)
FMTS Attending Note Patient's care plan discussed with resident team and I agree with Dr Eyvonne Mechanic note for today.  Dalbert Mayotte, MD

## 2014-01-16 NOTE — Progress Notes (Signed)
Physical Therapy Treatment Patient Details Name: Sarah [name] MRN: [id number] DOB: 05-16-1965 Today's Date: 01/16/2014    History of Present Illness 48 y.o. female admitted to [facility] on 12/12/13 with progressive lower extremity weakness and mid to upper back pain.  Pt was found to have a mass at T3 with (+) spinal cord compression.  On 9/23 pt underwent surgery for decompression and biopsy.  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  Her thyroid is very enlarged.  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 Pt is NOW S/P T1-T5 postlat arthrodesis allograft, T1-T5 segmental pedicle screw fixation on 12/30/13.  S/p thyroidectomy 10/19, extubated 10/20.    PT Comments    Patient continues to be highly motivated to progress with therapy and eager to attempt sitting up. Patient fatiguing quickly this session. Worked on sitting balance while attempting to narrow UE BOS with sitting. Attempted unilateral UE support but patient then requiring MOD A for support to prevent falling on side or posteriorly back into bed. Continue to recommend comprehensive inpatient rehab (CIR) for post-acute therapy needs.   Follow Up Recommendations  CIR     Equipment Recommendations  Wheelchair (measurements PT);Wheelchair cushion (measurements PT);Hospital bed;3in1 (PT)    Recommendations for Other Services       Precautions / Restrictions Precautions Precautions: Fall;Back Precaution Comments: bil leg weakness, paraplegia    Mobility  Bed Mobility Overal bed mobility: Needs Assistance;+2 for physical assistance Bed Mobility: Rolling;Sidelying to Sit;Sit to Sidelying Rolling: Mod assist   Supine to sit: +2 for physical assistance;Max assist Sit to supine: +2 for physical assistance;Total assist   General bed mobility comments: utilized railing for rolling side to side. A for BLE and for trunk support into sitting. Total A for  positioning back into supine  Transfers                    Ambulation/Gait                 Stairs            Wheelchair Mobility    Modified Rankin (Stroke Patients Only)       Balance Overall balance assessment: Needs assistance Sitting-balance support: Bilateral upper extremity supported;Feet supported Sitting balance-Leahy Scale: Poor Sitting balance - Comments: Patient required BUE support for sitting EOB, attempting to narrow BOS by having patient place hands on chair out in front of her. Increased work load on patient. Min A with faitgue to positioning back to midline at times. Patient able to sit EOB ~15 minsA to postion BLE in sitting Postural control: Posterior lean                          Cognition Arousal/Alertness: Awake/alert Behavior During Therapy: WFL for tasks assessed/performed Overall Cognitive Status: Within Functional Limits for tasks assessed                      Exercises      General Comments        Pertinent Vitals/Pain Pain Assessment: No/denies pain    Home Living                      Prior Function            PT Goals (current goals can now be found in the care  plan section) Progress towards PT goals: Progressing toward goals (slowly)    Frequency  Min 3X/week    PT Plan Current plan remains appropriate;Frequency needs to be updated    Co-evaluation             End of Session   Activity Tolerance: Patient limited by fatigue Patient left: in bed;with call bell/phone within reach;with family/visitor present     Time: 1121-1201 PT Time Calculation (min): 40 min  Charges:  $Therapeutic Activity: 38-52 mins                    G Codes:      Jacqualyn Posey 01/16/2014, 2:03 PM 01/16/2014 Jacqualyn Posey PTA [phone] pager [phone] office

## 2014-01-16 NOTE — Progress Notes (Signed)
Patient ID: Sarah [name], female   DOB: [birth year], 48 y.o.   MRN: [id number] BP 136/76  Pulse 87  Temp(Src) 98.6 F (37 C) (Oral)  Resp 18  Ht 5\' 11"  (1.803 m)  Wt 142.4 kg (313 lb 15 oz)  BMI 43.80 kg/m2  SpO2 100%  LMP 11/28/2013 Alert and oriented x 4, speech is clear and fluent Moving upper extremities well No neurologic change.  Appreciate rad onc consult. Will go to [facility] tomorrow for ct sim

## 2014-01-16 NOTE — Consult Note (Signed)
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 Nori Riis.    HPI: Sarah [name] is a 48 y.o. female with history of HTN, chronic back pain, morbid obesity who was admitted on 12/10/13 with BLE weakness with inability to walk as well as problems with urinary retention and constipation. Work up revealed a lesion at the T3 level of the upper thoracic spine with pathologic compression fracture and epidural tumor resulting in moderate cord compression with evidence of mild spinal cord edema, as well as right suprahilar mass with pulmonary nodules with mediastinal lymph nodes c/w metastatic disease, RUL & RLL PE, tubular cystic masses bilateral adnexal regions and a massive thyroid goiter causing tracheal deviation to the right with narrowing of trachea. She was started on steriods and foley placed for urinary retention. She underwent thoracic laminectomy with excision of tumor on 09/23 by Dr. Christella Noa and on bedrest pending further surgery. Pathology favoring metastatic adenocarcinoma of lung but metastatic thyroid cancer in differential . 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 Buddy Duty consulted for input. 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 Patient underwent T1-T5 posterolateral arthrodesis with allograft on 10/09 with plans XRT to be initiated 2 weeks post op. Dr. Constance Holster consulted for input and patient underwent total thyroidectomy on 10/19 and extubated without difficulty on 10/20. 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 She was changed to Lovenox today as  platelets have stabilized and Dr. Hosie Poisson  recommends transition to [place]. .   She continues to have paraparesis and therapy working on bed mobility as well as sitting balance at edge of bed. Recurrent UTI treated with Keflex. Foley remains in place and bowel incontinence reported. Dr. [name] consulted for XRT to spine for pain palliation as well as prevention of further spinal cord injury. Set for simulation on 10/27--question number of treatments.  Right hilar mass without clear pathologic diagnosis and patient to follow up with Dr. Lamonte Sakai in 1-2 months on outpatient basis.  Dr. Earlie Server to follow for input on chemotherapy on metastatic Hurtle cell cancer? CIR recommended for follow up therapy by MD and rehab team.       Review of Systems  Eyes: Negative for blurred vision and double vision.  Respiratory: Negative for cough and shortness of breath.   Cardiovascular: Negative for chest pain and palpitations.  Gastrointestinal: Positive for constipation. Negative for heartburn and nausea.  Musculoskeletal: Negative for myalgias.       BLE spasms.   Neurological: Positive for sensory change, focal weakness and weakness. Negative for dizziness.  Psychiatric/Behavioral: The patient does not have insomnia.     Past Medical History  Diagnosis Date  . Hypertension   . DDD (degenerative disc disease), cervical   . DJD (degenerative joint disease)   . Obesity   . Chronic back pain   . 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 Tonsillectomy    . Laminectomy N/A 12/14/2013    Procedure: THORACIC LAMINECTOMY FOR TUMOR THORACIC THREE;  Surgeon: Ashok Pall, MD;  Location: [place]  ORS;  Service: Neurosurgery;  Laterality: N/A;  THORACIC LAMINECTOMY FOR TUMOR THORACIC THREE  . Posterior lumbar fusion 4 level N/A 12/30/2013    Procedure: Thoracic one-Thoracic five posterior thoracic fusion with pedicle screws;   Surgeon: Ashok Pall, MD;  Location: [place] [facility];  Service: Neurosurgery;  Laterality: N/A;  Thoracic one-Thoracic five posterior thoracic fusion with pedicle screws  . Thyroidectomy N/A 01/09/2014    Procedure: TOTAL THYROIDECTOMY;  Surgeon: Izora Gala, MD;  Location[facility] OR;  Service: ENT;  Laterality: N/A;    Family History  Problem Relation Age of Onset  . Hypertension Mother   . Diabetes Mother   . Hypertension Father   . Stroke Father     Social History:  Single. Lives with sister and was working at [employer] PTA. Parents and sister plan on assisting past discharge.  Per reports that she has never smoked but exposed to second hand smoke. She has never used smokeless tobacco. She reports that she does not drink alcohol or use illicit drugs.   Allergies  Allergen Reactions  . Tramadol Nausea And Vomiting    Medications Prior to Admission  Medication Sig Dispense Refill  . albuterol (PROVENTIL HFA;VENTOLIN HFA) 108 (90 BASE) MCG/ACT inhaler Inhale 2 puffs into the lungs every 6 (six) hours as needed for wheezing or shortness of breath.      . diazepam (VALIUM) 5 MG tablet Take 5 mg by mouth 3 (three) times daily as needed for muscle spasms (pain).      [name] Kitchen ibuprofen (ADVIL,MOTRIN) 200 MG tablet Take 600 mg by mouth every 6 (six) hours as needed for mild pain.       . cyclobenzaprine (FLEXERIL) 5 MG tablet Take 1 tablet (5 mg total) by mouth 3 (three) times daily as needed for muscle spasms.  30 tablet  0  . diclofenac (VOLTAREN) 75 MG EC tablet Take 1 tablet (75 mg total) by mouth 2 (two) times daily.  14 tablet  0  . HYDROcodone-acetaminophen (NORCO/VICODIN) 5-325 MG per tablet Take 1 tablet by mouth every 4 (four) hours as needed.  20 tablet  0  . losartan (COZAAR) 25 MG tablet Take 0.5 tablets (12.5 mg total) by mouth daily.  30 tablet  0    Home: Home Living Family/patient expects to be discharged to:: Private residence Living Arrangements: Other relatives Available Help  at Discharge: Family;Available 24 hours/day Type of Home: House Home Access: Stairs to enter [employer] of Steps: 2 (deep enough for all 4 legs of RW) Entrance Stairs-Rails: None Home Layout: One level Home Equipment: Walker - 2 wheels  Functional History: Prior Function Level of Independence: Independent Comments: Pt works at [employer].  Functional Status:  Mobility: Bed Mobility Overal bed mobility: Needs Assistance;+2 for physical assistance Bed Mobility: Rolling;Sidelying to Sit;Sit to Sidelying Rolling: Mod assist Sidelying to sit: Max assist;+2 for physical assistance Supine to sit: +2 for physical assistance;Max assist Sit to supine: +2 for physical assistance;Total assist Sit to sidelying: Max assist;+2 for physical assistance General bed mobility comments: utilized railing for rolling side to side. A for BLE and for trunk support into sitting. Total A for positioning back into supine Transfers Overall transfer level: Needs assistance General transfer comment: maxi move from bed to recliner with 2 person assist for safety and manipulation of environment   Wheelchair Mobility Wheelchair mobility: Yes Wheelchair propulsion: Both upper extremities Wheelchair parts: Supervision/cueing Distance: 85 Wheelchair Assistance Details (indicate cue type and reason): Pt pushed out of the  tight room with many obstacles into the wide hallway and she was able to wheel herself with bil upper extremities further down the hall than yesterday and with only supervision assistance.  Next session WC mobility should focus on navigating more obstacles and turns.   ADL: ADL Overall ADL's : Needs assistance/impaired Eating/Feeding: Set up;Bed level Eating/Feeding Details (indicate cue type and reason): pt reports difficulty drinking from cups with short straws. Provided pt with tubing into ice container to increase independence with drinking.  Grooming: Dance movement psychotherapist;Bed  level;Set up Upper Body Bathing: Minimal assitance;Bed level General ADL Comments: Pt with bowel incontinence without awareness, total assist to clean periarea and change pad.  Cognition: Cognition Overall Cognitive Status: Within Functional Limits for tasks assessed Orientation Level: Oriented X4 Cognition Arousal/Alertness: Awake/alert Behavior During Therapy: WFL for tasks assessed/performed Overall Cognitive Status: Within Functional Limits for tasks assessed Area of Impairment: Orientation Orientation Level: Time  Blood pressure 138/77, pulse 88, temperature 98.2 F (36.8 C), temperature source Oral, resp. rate 18, height 5\' 11"  (1.803 m), weight 142.4 kg (313 lb 15 oz), last menstrual period 11/28/2013, SpO2 100.00%. Physical Exam  Nursing note and vitals reviewed. Constitutional: She is oriented to person, place, and time. She appears well-developed and well-nourished.  HENT:  Head: Normocephalic and atraumatic.  Eyes: Conjunctivae are normal. Pupils are equal, round, and reactive to light.  Right neck incision with staples in place.   Cardiovascular: Normal rate and regular rhythm.   Respiratory: Effort normal. No respiratory distress. She has wheezes.  GI: Soft. Bowel sounds are normal. She exhibits no distension. There is no tenderness.  Neurological: She is alert and oriented to person, place, and time.  Speech clear. Follows commands without difficulty. Dense paraparesis with flexor spasms and few beats clonus on the right. Diminished sensation 1/2 below level of injury. Doesn't distinguish pain from gross touch.  Skin: Skin is warm and dry.    Results for orders placed during the hospital encounter of 12/12/13 (from the past 24 hour(s))  GLUCOSE, CAPILLARY     Status: Abnormal   Collection Time    01/15/14  4:45 PM      Result Value Ref Range   Glucose-Capillary 155 (*) 70 - 99 mg/dL  GLUCOSE, CAPILLARY     Status: Abnormal   Collection Time    01/15/14  9:24 PM        Result Value Ref Range   Glucose-Capillary 148 (*) 70 - 99 mg/dL  CBC     Status: Abnormal   Collection Time    01/16/14  5:47 AM      Result Value Ref Range   WBC 5.0  4.0 - 10.5 K/uL   RBC 3.00 (*) 3.87 - 5.11 MIL/uL   Hemoglobin 8.7 (*) 12.0 - 15.0 g/dL   HCT 25.7 (*) 36.0 - 46.0 %   MCV 85.7  78.0 - 100.0 fL   MCH 29.0  26.0 - 34.0 pg   MCHC 33.9  30.0 - 36.0 g/dL   RDW 15.0  11.5 - 15.5 %   Platelets 331  150 - 400 K/uL  APTT     Status: Abnormal   Collection Time    01/16/14  5:47 AM      Result Value Ref Range   aPTT 59 (*) 24 - 37 seconds  GLUCOSE, CAPILLARY     Status: Abnormal   Collection Time    01/16/14  6:46 AM      Result Value Ref Range  Glucose-Capillary 106 (*) 70 - 99 mg/dL  GLUCOSE, CAPILLARY     Status: Abnormal   Collection Time    01/16/14 11:36 AM      Result Value Ref Range   Glucose-Capillary 146 (*) 70 - 99 mg/dL   No results found.  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 Does the need for close, 24 hr/day medical supervision in concert with the patient's rehab needs make it unreasonable for this patient to be served in a less intensive setting? Yes 2. Co-Morbidities requiring supervision/potential complications: pain, skin care, neurogenic bladder and bowel 3. Due to bladder management, bowel management, safety, skin/wound care, disease management, medication administration and pain management, does the patient require 24 hr/day rehab nursing? Yes 4. Does the patient require coordinated care of a physician, rehab nurse, PT (1-2 hrs/day, 5 days/week) and OT (1-2 hrs/day, 5 days/week) to address physical and functional deficits in the context of the above medical diagnosis(es)? Yes Addressing deficits in the following areas: balance, endurance, locomotion, strength, transferring, bowel/bladder control, bathing, dressing, feeding, grooming, toileting and  psychosocial support 5. Can the patient actively participate in an intensive therapy program of at least 3 hrs of therapy per day at least 5 days per week? Yes 6. The potential for patient to make measurable gains while on inpatient rehab is good 7. Anticipated functional outcomes upon discharge from inpatient rehab are min assist  with PT, supervision and min assist with OT, n/a with SLP. 8. Estimated rehab length of stay to reach the above functional goals is: 20-27 days 9. Does the patient have adequate social supports to accommodate these discharge functional goals? Yes 10. Anticipated D/C setting: Home 11. Anticipated post D/C treatments: HH therapy and Outpatient therapy 12. Overall Rehab/Functional Prognosis: excellent  RECOMMENDATIONS: This patient's condition is appropriate for continued rehabilitative care in the following setting: CIR Patient has agreed to participate in recommended program. Yes Note that insurance prior authorization may be required for reimbursement for recommended care.  Comment: Rehab Admissions Coordinator to follow up.  Thanks,  [name], MD, [facility] Drown     01/16/2014

## 2014-01-16 NOTE — Progress Notes (Signed)
ANTICOAGULATION CONSULT NOTE - Follow Up Consult  Pharmacy Consult:  Argatroban Indication:  PE  Allergies  Allergen Reactions  . Tramadol Nausea And Vomiting    Patient Measurements: Height: 5\' 11"  (180.3 cm) Weight: 313 lb 15 oz (142.4 kg) IBW/kg (Calculated) : 70.8  Vital Signs: Temp: 98.1 F (36.7 C) (10/26 1010) Temp Source: Oral (10/26 1010) BP: 146/84 mmHg (10/26 1010) Pulse Rate: 83 (10/26 1010)  Labs:  Recent Labs  01/14/14 0501  01/14/14 1816 01/15/14 0520 01/16/14 0547  HGB 8.1*  --   --  8.4* 8.7*  HCT 24.6*  --   --  25.2* 25.7*  PLT 280  --   --  316 331  APTT 34  < > 60* 61* 59*  CREATININE 0.76  --   --   --   --   < > = values in this interval not displayed.  Estimated Creatinine Clearance: 136.4 ml/min (by C-G formula based on Cr of 0.76).      Assessment: 6 YOF with new RLL PE to continue on argatroban until long-term anticoagulation plan is decided.  APTT therapeutic and stable; no bleeding reported.  Although patient's SRA is negative, MD is concerned Lovenox will drop patient's platelets.  PO anticoagulant not optimal in setting of cancer.   Goal of Therapy:  aPTT 50 - 90 seconds Monitor platelets by anticoagulation protocol: Yes    Plan:  - Contintinue Argatroban 0.75 mcg/kg/min = 6.4 ml/hr  - Daily aPTT / CBC  - F/u plan for long-term anticoagulation    Nielle Duford D. Mina Marble, PharmD, BCPS Pager:  [phone] 01/16/2014, 11:19 AM

## 2014-01-16 NOTE — Consult Note (Signed)
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 female who presented with back pain, progressive weakness and numbness of lower extremities starting on 12/07/2013. Patient indicated that she has become weak to the point that she is unable to bear weight. She was seen in emergency room at [facility] on 12/09/13 and CT scan of the lumbar spine on was unremarkable. She was given Vicodin and diazepam as well as Toradol and was discharged to home. Patient then fell at home and was unable to get back up to her feet. She called EMS, and she was subsequently admitted for further evaluation. MRI of the lumbosacral spine on 12/10/13 showed degenerative disc disease and degenerative facet disease in the lower lumbar spine.  She was seen by neurology during her admission and there was no obvious etiology for her symptoms. She was discharged after ambulating 60 feet with physical therapy on 9/21. After ~ 1 hour car ride to her home, she was unable to stand, or walk. She was then brought by ambulance back to [facility] for admission. A cervical spine MRI 12/14/13 suggested a lesion below the c-spine incompletely visualized in the thoracic spine compressing the spinal cord. Further evaluation with CT, and Thoracic MRI revealed a lytic mass at T3, a markedly enlarged thyroid/mass in the neck, severe cord compression at T3. She has been unable to void, nor pass her stool.  She underwent emergent dorsal  laminectomy to decompress the spinal cord and obtain biopsy on 12/15/13.  Biopsy revealed metastatic carcinoma of either lung or thyroid origin.  Post-operatively, the patient has had minimal recovery of lower extremity motor strength.  She does have diminished sensation in the legs.  Due to risk for spinal instability, Dr. Cyndy Freeze subsequently performed  Procedure(s):T1-5 posterolateral arthrodesis, allograft and five segmental pedicle screw fixation on 12/30/13.  She underwent thyroidectomy on 01/09/14 revealing  Hurthle cell carcinoma.Diffusely involves both thyroid lobes with Extrathyroidal extension with negative margins and 2 negative lymph nodes.  The patient has kindly been referred for consideration of adjuvant radiotherapy to T3 for palliation of pain and prevention of further spinal cord injury.  PREVIOUS RADIATION THERAPY: No  PAST MEDICAL HISTORY:  has a past medical history of Hypertension; DDD (degenerative disc disease), cervical; DJD (degenerative joint disease); Obesity; Chronic back pain; and Cancer.    PAST SURGICAL HISTORY: Past Surgical History  Procedure Laterality Date  . Tonsillectomy    . Laminectomy N/A 12/14/2013    Procedure: THORACIC LAMINECTOMY FOR TUMOR THORACIC THREE;  Surgeon: Ashok Pall, MD;  Location: [facility];  Service: Neurosurgery;  Laterality: N/A;  THORACIC LAMINECTOMY FOR TUMOR THORACIC THREE  . Posterior lumbar fusion 4 level N/A 12/30/2013    Procedure: Thoracic one-Thoracic five posterior thoracic fusion with pedicle screws;  Surgeon: Ashok Pall, MD;  Location: [place] [facility];  Service: Neurosurgery;  Laterality: N/A;  Thoracic one-Thoracic five posterior thoracic fusion with pedicle screws  . Thyroidectomy N/A  01/09/2014    Procedure: TOTAL THYROIDECTOMY;  Surgeon: Izora Gala, MD;  Location: [facility] OR;  Service: ENT;  Laterality: N/A;    FAMILY HISTORY: family history includes Diabetes in her mother; Hypertension in her father and mother; Stroke in her  father.  SOCIAL HISTORY:  reports that she has never smoked. She has never used smokeless tobacco. She reports that she does not drink alcohol or use illicit drugs.  ALLERGIES: Tramadol  MEDICATIONS:  Current Facility-Administered Medications  Medication Dose Route Frequency Provider Last Rate Last Dose  . 0.9 %  sodium chloride infusion   Intravenous Continuous Olam Idler, MD 10 mL/hr at 01/13/14 1753 10 mL/hr at 01/13/14 1753  . acetaminophen (TYLENOL) tablet 650 mg  650 mg Oral Q4H PRN Ashok Pall, MD   650 mg at 01/15/14 2156   Or  . acetaminophen (TYLENOL) suppository 650 mg  650 mg Rectal Q4H PRN Ashok Pall, MD      . albuterol (PROVENTIL) (2.5 MG/3ML) 0.083% nebulizer solution 2.5 mg  2.5 mg Inhalation Q6H PRN Hilton Sinclair, MD      . antiseptic oral rinse (CPC / CETYLPYRIDINIUM CHLORIDE 0.05%) solution 7 mL  7 mL Mouth Rinse QID Collene Gobble, MD   7 mL at 01/16/14 1643  . cephALEXin (KEFLEX) capsule 500 mg  500 mg Oral QID Dickie La, MD   500 mg at 01/16/14 1542  . enoxaparin (LOVENOX) injection 140 mg  140 mg Subcutaneous Q12H Dickie La, MD   140 mg at 01/16/14 1643  . fentaNYL (SUBLIMAZE) injection 12.5-25 mcg  12.5-25 mcg Intravenous Q2H PRN Collene Gobble, MD      . Influenza vac split quadrivalent PF (FLUARIX) injection 0.5 mL  0.5 mL Intramuscular Tomorrow-1000 Dickie La, MD      . insulin aspart (novoLOG) injection 0-15 Units  0-15 Units Subcutaneous TID WC Lavon Paganini, MD   2 Units at 01/16/14 1312  . insulin aspart (novoLOG) injection 0-5 Units  0-5 Units Subcutaneous QHS Lavon Paganini, MD      . lactated ringers infusion   Intravenous Continuous Izora Gala, MD      . levothyroxine (SYNTHROID, LEVOTHROID) tablet 250 mcg  250 mcg Oral QAC breakfast Lavon Paganini, MD   250 mcg at 01/16/14 0913  . LORazepam (ATIVAN) tablet 0.5 mg  0.5 mg Oral Q6H PRN Elberta Leatherwood, MD   0.5 mg at 01/14/14 0133  . menthol-cetylpyridinium (CEPACOL) lozenge 3 mg   1 lozenge Oral PRN Ashok Pall, MD       Or  . phenol (CHLORASEPTIC) mouth spray 1 spray  1 spray Mouth/Throat PRN Ashok Pall, MD      . senna (SENOKOT) tablet 8.6 mg  1 tablet Oral BID Ashok Pall, MD   8.6 mg at 01/16/14 0954  . sodium chloride 0.9 % injection 10-40 mL  10-40 mL Intracatheter Q12H Newman Pies, MD   10 mL at 01/16/14 0955  . sodium chloride 0.9 % injection 10-40 mL  10-40 mL Intracatheter PRN Dickie La, MD      . zolpidem (AMBIEN) tablet 5 mg  5 mg Oral QHS PRN Rush Farmer, MD        REVIEW OF SYSTEMS:  A 15 point review of systems is documented in the electronic medical record. This was obtained by the nursing staff. However, I reviewed this with the patient to discuss relevant findings and make appropriate changes.  Pertinent items are noted in HPI.  PHYSICAL EXAM:  height is 5\' 11"  (1.803 m) and weight is 313 lb 15 oz (142.4 kg). Her oral temperature is 98.2 F (36.8 C). Her blood pressure is 138/77 and her pulse is 88. Her respiration is 18 and oxygen saturation is 100%.   Per Pam Love: Constitutional: She is oriented to person, place, and time. She appears well-developed and well-nourished.  HENT:  Head: Normocephalic and atraumatic.  Eyes: Conjunctivae are normal. Pupils are equal, round, and reactive to light.  Right neck incision with staples in place.  Cardiovascular: Normal rate and regular rhythm.  Respiratory: Effort normal. No respiratory distress. She has wheezes.  GI: Soft. Bowel sounds are normal. She exhibits no distension. There is no tenderness.  Neurological: She is alert and oriented to person, place, and time.  Speech clear. Follows commands without difficulty. Dense paraparesis with flexor spasms and few beats clonus on the right.  Skin: Skin is warm and dry.  On my brief exam, I concur with these findings and note that she has intact sensation to light touch in the lower extremities.   KPS = 40  100 - Normal; no complaints; no  evidence of disease. 90   - Able to carry on normal activity; minor signs or symptoms of disease. 80   - Normal activity with effort; some signs or symptoms of disease. 38   - Cares for self; unable to carry on normal activity or to do active work. 60   - Requires occasional assistance, but is able to care for most of his personal needs. 50   - Requires considerable assistance and frequent medical care. 39   - Disabled; requires special care and assistance. 16   - Severely disabled; hospital admission is indicated although death not imminent. 31   - Very sick; hospital admission necessary; active supportive treatment necessary. 10   - Moribund; fatal processes progressing rapidly. 0     - Dead  Karnofsky DA, Abelmann Cutter, Craver LS and Burchenal JH [phone]) The use of the nitrogen mustards in the palliative treatment of carcinoma: with particular reference to bronchogenic carcinoma Cancer 1 [id number]  LABORATORY DATA:  Lab Results  Component Value Date   WBC 5.0 01/16/2014   HGB 8.7* 01/16/2014   HCT 25.7* 01/16/2014   MCV 85.7 01/16/2014   PLT 331 01/16/2014   Lab Results  Component Value Date   NA 141 01/14/2014   K 3.3* 01/14/2014   CL 101 01/14/2014   CO2 28 01/14/2014   Lab Results  Component Value Date   ALT 18 01/14/2014   AST 18 01/14/2014   ALKPHOS 140* 01/14/2014   BILITOT 0.5 01/14/2014     RADIOGRAPHY: Dg Thoracic Spine 2 View  12/30/2013   CLINICAL DATA:  T1 through T5 posterior fusion.  Initial evaluation.  EXAM: THORACIC SPINE - 2 VIEW  COMPARISON:  Thoracic spine MRI 12/14/2013.  FINDINGS: Intraoperative portable AP view upper thoracic spine. Pedicle screws are noted it was most likely T1 and T2 as well as T4 and T5.  IMPRESSION: Thoracic pedicle screws noted in this single AP portable intraoperative view.   Electronically Signed   By: Marcello Moores  Register   On: 12/30/2013 18:30   US Soft Tissue Head/neck  12/22/2013   CLINICAL DATA:  Thyroid aorta, abnormal thyroid on  CT  EXAM: THYROID ULTRASOUND  TECHNIQUE: Ultrasound examination of the thyroid gland and adjacent soft tissues was performed. The examination is significant difficult due to the patient's short neck as well as  a predominance of the left lobe of the thyroid being beneath the clavicle in the superior mediastinum.  COMPARISON:  12/14/2013  FINDINGS: Right thyroid lobe  Measurements: 9.3 x 5.4 x 6.3 cm. Diffusely heterogeneous without definitive nodule.  Left thyroid lobe  Measurements: 7.4 x 6.1 x 8.1 cm. Diffusely heterogeneous without definitive nodule.  Isthmus  Thickness: 3.4 cm.  Diffuse heterogeneous.  Lymphadenopathy  None visualized.  IMPRESSION: Grossly enlarged and diffusely heterogeneous thyroid gland without definitive focal lesion. 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   Electronically Signed   By: Inez Catalina M.D.   On: 12/22/2013 11:23   US Thyroid Biopsy  12/23/2013   CLINICAL DATA:  Massive thyromegaly, neck swelling, thoracic spinal tumor.  EXAM: ULTRASOUND GUIDED NEEDLE ASPIRATE BIOPSY OF THE THYROID GLAND  COMPARISON:  12/22/2013  PROCEDURE: Thyroid biopsy was thoroughly discussed with the patient and questions were answered. The benefits, risks, alternatives, and complications were also discussed. The patient understands and wishes to proceed with the procedure. Written consent was obtained.  Ultrasound was performed to localize and mark an adequate site for the biopsy. The patient was then prepped and draped in a normal sterile fashion. Local anesthesia was provided with 1% lidocaine. Using direct ultrasound guidance, 4 passes were made using needles into the nodule within the right lobe of the thyroid. Ultrasound was used to confirm needle placements on all occasions. Specimens were sent to Pathology for analysis.  Complications:  No immediate  FINDINGS: Imaging  confirms needle placement in the heterogeneous masslike enlargement of the right thyroid lobe  IMPRESSION: Ultrasound guided needle aspirate biopsy performed of the massively enlarged right thyroid lobe.   Electronically Signed   By: Daryll Brod M.D.   On: 12/23/2013 13:06   Portable Chest Xray  01/09/2014   CLINICAL DATA:  Endotracheal tube placement.  EXAM: PORTABLE CHEST - 1 VIEW  COMPARISON:  12/30/2013.  FINDINGS: New endotracheal tube tip projects 2.9 cm above the carina, well positioned.  Right internal jugular central venous catheter has its tip in the lower superior vena cava.  There are new surgical staples at the anterior neck base.  There are larger lung volumes on the current study. Mild reticular opacity is noted at the lung bases most likely atelectasis. No lung consolidation or edema. There is no pleural effusion or pneumothorax.  Cardiac silhouette is normal in size. No mediastinal or hilar masses.  There stable changes from a previous upper thoracic spine fusion.  IMPRESSION: 1. Endotracheal tube is well positioned with its tip 2.9 cm above the carina. 2. No acute cardiopulmonary disease. There is mild lung base subsegmental atelectasis, but no evidence of residual congestive heart failure/edema.   Electronically Signed   By: Lajean Manes M.D.   On: 01/09/2014 21:14   Dg Chest Port 1 View  12/30/2013   CLINICAL DATA:  Central line placement.  EXAM: PORTABLE CHEST - 1 VIEW  COMPARISON:  CT chest 12/14/2013.  FINDINGS: Right IJ line noted with its tip projected over superior vena cava. Cardiomegaly. Mild pulmonary venous congestion interstitial prominence noted suggesting congestive heart failure. No pleural effusion or pneumothorax. Prior T1 through T5 fusion. No acute bony abnormality.  IMPRESSION: 1. Right IJ line noted with its tip projected over superior vena cava . 2. Congestive heart failure with interstitial edema. 3. Prior T1 through T5 thoracic spine fusion.   Electronically  Signed   ByMarcello Moores  Register   On: 12/30/2013 20:24   Dg C-arm 1-60 Min  12/30/2013   CLINICAL DATA:  T1 through T5 fusion.  EXAM: DG C-ARM 61-120 MIN  TECHNIQUE: Intraoperative AP view upper thoracic spine obtained.  CONTRAST:  None.  FLUOROSCOPY TIME:  2 min 21 seconds.  COMPARISON:  None.  FINDINGS: Pedicle screws are noted what are most likely T1 and T2 and T4 and T5.  IMPRESSION: Pedicle screws are noted in the lower most likely T1 and T2 and T4 and T5.   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  She is stage T4a N0 M1, Stage IVC.  The patient may benefit from adjuvant radiotherapy to the upper T-spine to include T3 to help preserve existing spinal cord function and palliate pain.  PLAN:Today, I talked to the patient about the findings and work-up thus far.  We discussed the natural history of spinal metastases and general treatment, highlighting the role of radiotherapy in the management.  We discussed the available radiation techniques, and focused on the details of logistics and delivery.  We reviewed the anticipated acute and late sequelae associated with radiation in this setting.  The patient was encouraged to ask questions that I answered to the best of my ability.  I filled out a patient counseling form during our discussion including treatment diagrams.  We retained a copy for our records.  The patient would like to proceed with radiation and will be scheduled for CT simulation tomorrow at 11 am at [facility] via [facility].  I spent 40 minutes minutes face to face with the patient and more than 50% of that time was spent in counseling and/or coordination of care.   ------------------------------------------------  [name]. [name], M.D.

## 2014-01-16 NOTE — Progress Notes (Signed)
Family Medicine Teaching Service Daily Progress Note Intern Pager: [phone]  Patient name: Sarah [name] Medical record number: [id number] Date of birth: 06/28/1965 Age: 48 y.o. Gender: female  Primary Care Provider: No PCP Per Patient Consultants: Neurosurgery, IR, Oncology, Endocrinology, Rad Onc Code Status: Full  Pt Overview and Major Events to Date:  - 9/22: Pt. Admitted with lower extremity weakness.  - 9/23: Pt. Found to have mass at T-3 level with spinal compression.  - 9/24: Surgery performed for decompression and biopsy overnight. Pt. Post-op and doing well. 3cm Suprahilar mass on CT Chest, PE also identified. ?Thyroid etiology, but much less likely for mets. Oncology consulted.  - 9/25 - 9/29: Awaiting pathology results with plan for oncology to see once path returns. Pt. With little neurological improvement. Started with light PT.  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 Oncology on board. Neurosurgery to comment on need for spinal stabilization surgery.  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 - 10/8: Underwent spinal stabilization surg. - 10/10: Restarted heparin  - 10/13: Dr. GerkiHarlow Asale to perform surg as inpatient; will seek other consultations - 10/14: Dr. RosenConstance HolsterNT consulted. He will see pt on 10/15. - 10/16: ENT to perform Thyroidectomy Monday 10/19 - 10/17: All cell lines (WBC, Hgb, Plts) continue to drop: Checking Labs as below - 10/18: Midnight, holding heparin in preparation for surgery 10/19 - 10/19: Total thyroidectomy - 10[facility]22: [facility] - 10/26: CIR consult placed - pending  Assessment and Plan: Sarah [name] 48 y.47 female  presenting with continued lower extremity weakness at baseline after discharge from hospital on 9/21. PMH is significant for obesity, HTN, and ?History of Hypothyroidism previously on Synthroid per patient.   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  Neurosurgery took her to the OR on 9/23, and biopsied as well as performed laminectomy for decompression. Surgical pathology with metastatic adenocarcinoma. Likely poor prognosis.  - Neurosurgery (Dr. CabbeChristella Noalowing: Patient was taken for spinal stabilization 10/9.  - Oncology following (Dr. MagriJana Hakimpatient; Dr. 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 Large thyroid goiter noted. TSH normal; ultrasound on 10/1, Biopsy on 10/2 - FNA results: "clusters of neoplastic cells w/ granular/oncogenic cytoplasm--similar to previous bx" - Dr. RosenConstance HolsterT) following. Greatly appreciate the input. - Endocrinology note with great recs (Dr. Kerr)Buddy Dutyill need to begin radioiodine therapy and external beam radiation when deemed appropriate by oncology.  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  PT recommendations CIR vs SNF with 24hour supervision. Will plan dispo based on goals of care discussion. - Foley catheter in place for urinary retention; Stooling regularly, no sxs of fecal retention.  -  Discussed Recs for PT with Neurosurgery. Light PT at this point. Pt. Not to get UOB.  - TED hose  #Pulmonary Embolus -  Found on CT Chest  Pt. With brief episodes of tachycardia, but otherwise asymptomatic with O2 sats 100% on 2L [place] >> now on Room Air.  Blood pressures stable. Lower extremity Venous Duplex negative for DVT.  - Hypercoagulable 2/2 neoplasia . ESR 59 (9/22)  - Now on Argatroban. Transitioning to Lovenox per pharm today - Will continue to monitor VS. Stable at this time.  # Anemia and Thrombocytopenia: Improving. Hgb and Plts have been trending down slowly since spinal stabilization surgery on 10/7, now uptrending.  No signs of end organ damage so low suspicion for TTP, DIC - FOBT - negative - Continue to follow CBC - HIT panel: neg - Argatroban;  transition to Lovenox today - will monitor  #Hyperglycemia -  Likely diabetic with PCOS given A1C of 6.7. CBG's 110s-140s.  - Patient informed of elevated A1C and likely diagnosis of DMII. She agrees with [place] that this is a marginal issue at this point, and we will recommend treatment based upon our goals of care discussion.  - Continue to monitor.  - Resistant SSI - may need to decrease to mod SSI  #Hypervirilization - with clitoromegaly noted on prior exam and buffalo hump and obesity as well as upper lip hair. PCOS vs. Adrenal patholog. Hypertensive and not hypotensive. Testing during this hospitalization after having recently received steroids would be largely non-diagnostic. Though, could also be a combination of isolated processes i.e. Clitoromegaly, Hirsutism 2/2 obesity. - Will leave for Outpatient f/u -- [place] to assess ovaries vs 21-hydroxylase/ACTH/cortisol.  - TSH, Cortisol WNL - A1C : 6.7 - consistent with PCOS given evidence of cystic structures in pelvis.   # UTI, 2/2 foley catheter - (10/2)UCx = Proteus - Resolved s/p 7d course of Cipro; (10/20)UCx = E Coli, resistant to cipro - dysuria and suprapubic pain noted PM 10/1. - 10/23 started on Keflex x 7 days  #HTN- BP well controlled -continue losartan.   #Obesity- consistent with  potential PCOS.   FEN/GI: Reg diet; KVO Prophylaxis: Lovenox, SCDs  Disposition: Pending specialist input, likely to CIR this week if bed is available.  Subjective: [name] doing well. Reports significant improvement and motivation w/ physical therapy. Patient may be regaining some motor function in BLE.   Objective: Temp:  [98.1 F (36.7 C)-98.9 F (37.2 C)] 98.2 F (36.8 C) (10/26 1330) Pulse Rate:  [83-91] 88 (10/26 1330) Resp:  [18] 18 (10/26 1330) BP: (127-146)/(77-84) 138/77 mmHg (10/26 1330) SpO2:  [97 %-100 %] 100 % (10/26 1330) Physical Exam: General: NAD, AAOx3, morbidly obese  HEENT: AT/[place],  neck supple, surgical incision noted w/o concern for infection Cardio: distant heart sounds due to habitus Lungs: Normal effort, CTA bilateral anteriorly. Abdomen: Soft and nontender, nondistended, No organomegaly noted.  Extremities: no edema,  No calf tenderness Skin: No lesions or rashes noted Neuro: cranial nerves grossly normal. Normal speech; showing improvement each day. Moves legs during exam.  Laboratory:  Recent Labs Lab 01/14/14 0501 01/15/14 0520 01/16/14 0547  WBC 4.2 4.8 5.0  HGB 8.1* 8.4* 8.7*  HCT 24.6* 25.2* 25.7*  PLT 280 316 331    Recent Labs Lab 01/09/14 2102  01/12/14 0505 01/13/14 0855 01/14/14 0501  NA 134*  < > 138 142 141  K 4.2  < > 3.6* 3.7 3.3*  CL 95*  < > 98 101 101  CO2  24  < > _0 BUN 9  < > _1 CREATININE 0.77  < > 0.69 0.69 0.76  CALCIUM 8.6  8.4  < > 8.2* 8.0* 7.8*  PROT 5.6*  --   --   --  5.5*  BILITOT 1.6*  --   --   --  0.5  ALKPHOS 329*  --   --   --  140*  ALT 45*  --   --   --  18  AST 39*  --   --   --  18  GLUCOSE 212*  < > 108* 109* 110*  < > = values in this interval not displayed. Urinalysis    Component Value Date/Time   COLORURINE YELLOW 01/09/2014 2215   APPEARANCEUR TURBID* 01/09/2014 2215   LABSPEC 1.004* 01/09/2014 2215   PHURINE 7.5 01/09/2014 2215   GLUCOSEU NEGATIVE 01/09/2014  2215   HGBUR LARGE* 01/09/2014 2215   BILIRUBINUR NEGATIVE 01/09/2014 2215   KETONESUR 15* 01/09/2014 2215   PROTEINUR NEGATIVE 01/09/2014 2215   UROBILINOGEN 0.2 01/09/2014 2215   NITRITE POSITIVE* 01/09/2014 2215   LEUKOCYTESUR LARGE* 01/09/2014 2215    Phos - 3.9 Mg - 2.2 CRP - 11.6 ESR - 59 CK - 56 B12 - 209 Random Cortisol - 25.4 A1C - 6.7 TSH - 2.13 RPR - nonreactive HIV - nonreactive UPT - Negative  Imaging/Diagnostic Tests:  CT Chest Abd/ Pelvis 9/23:  CLINICAL DATA: Destructive metastatic lesion of the T3 vertebral  body with no known primary carcinoma.  EXAM:  CT CHEST, ABDOMEN, AND PELVIS WITH CONTRAST  TECHNIQUE:  Multidetector CT imaging of the chest, abdomen and pelvis was  performed following the standard protocol during bolus  administration of intravenous contrast.  CONTRAST: 17m OMNIPAQUE IOHEXOL 300 MG/ML SOLN  COMPARISON: MRI of the cervical spine earlier today.  FINDINGS:  CT CHEST FINDINGS  Within the chest, a right suprahilar mass measures approximately 3.0  x 2.7 x 2.2 cm. This either represents a nodal metastasis or central  lung mass such as small cell carcinoma. A number of smaller  mediastinal lymph nodes are also identified in the right  paratracheal region, prevascular space and AP window.  Destructive lesion of the T3 vertebral body noted which nearly  replaces the entire vertebral body with tumor extension posteriorly  into the epidural space causing significant compression of the  spinal cord. Paravertebral extension of tumor is also suspected  lateral and anterior to the vertebral body, right greater than left.  Massive enlargement of the thyroid gland present with transverse  dimensions of approximately 7.7 x 8.5 cm. The thyroid gland is  incompletely visualized as the neck was not included. Central  dystrophic calcification identified. The enlarged thyroid gland does  cause significant mass effect on the airway with evidence of   tracheal deviation to the right and tracheal narrowing at the  thoracic inlet. The enlarged thyroid gland extends into the superior  mediastinum.  There is evidence of pulmonary embolism with nonocclusive thrombus  identified in the right middle lobe and lower lobe pulmonary  arteries. Pulmonary arterial opacification is not optimal on the  standard venous phase evaluation of the chest but the right sided  pulmonary embolism is clearly present.  There are small bilateral pulmonary nodules. Right-sided pulmonary  nodule measuring 6 mm likely localizes to the very top of the lower  lobe near the major fissure. Left upper lobe nodule measures 7 mm.  Lingular nodule measures 7  mm. Left lower lobe nodule measures 7 mm.  Faint peripheral right middle lobe nodule measures 3 mm. Inferior  right middle lobe nodule measures 6 mm. Several small nodules in the  posterior right upper lobe identified in the 3-4 mm range.  No evidence of pleural or pericardial fluid. No other bony lesions  identified in the thorax.  CT ABDOMEN AND PELVIS FINDINGS  Small low-density lesion in the left lobe of the liver measures 7 mm  and likely represents a cyst. No other hepatic abnormalities. The  gallbladder, pancreas, spleen, adrenal glands and kidneys are  unremarkable. No adenopathy is identified in the abdomen or pelvis.  Bowel shows normal caliber without evidence of obstruction, lesion  or inflammation. No abnormal fluid collections.  The bladder is moderately distended. Elongated cystic changes in  both adnexal regions identified which may represent a combination of  ovarian cysts and potentially hydrosalpinx. Correlation with pelvic  ultrasound is recommended. No hernias are identified. The uterus  appears unremarkable by CT. No bony lesions are identified in the  abdomen or pelvis.  IMPRESSION:  1. Destructive T3 metastasis as seen on MRI nearly completely  replaces the vertebral body and causes  significant spinal cord  compression with epidural extension of tumor.  2. 3 cm right suprahilar mass may represent nodal metastasis versus  primary central tumor. Next number multiple small bilateral  pulmonary nodules consistent with metastatic disease.  3. Acute nonocclusive pulmonary embolism in the right middle lobe  and right lower lobe pulmonary arteries.  4. Massive thyroid goiter causing tracheal deviation to the right  and narrowing of the trachea.  5. Tubular cystic abnormalities of both adnexal regions which may be  a combination of cystic changes in the ovaries and potentially  hydrosalpinx. Correlation recommended with pelvic ultrasound to  exclude any suspicious cystic lesions of the adnexae.  6. Critical Value/emergent results were called by telephone at the  time of interpretation on 12/14/2013 at 4:52 pm to Dr. Tamala Julian of the  [facility] Medicine Teaching Service, who verbally acknowledged these  results.  Electronically Signed  By: Aletta Edouard M.D.  On: 12/14/2013 16:53   MRI T-spine 9/23:  CLINICAL DATA: Lower extremity weakness. Worsening back pain.  Compressive upper thoracic mass partially visualized on cervical  spine MRI as well as chest CT with spinal cord compression.  EXAM:  MRI THORACIC SPINE WITHOUT CONTRAST  TECHNIQUE:  Multiplanar, multisequence MR imaging of the thoracic spine was  performed. No intravenous contrast was administered.  COMPARISON: Cervical spine MRI and chest CT earlier today  FINDINGS:  There is a T3 compression fracture with approximately 50% height  loss centrally. As described on cervical spine MRI and chest CT  earlier today, there is abnormal soft tissue replacing the majority  of the T3 vertebral body extending into the pedicles as well as into  the ventral epidural space with moderate cord compression. Epidural  extension is slightly greater on the right and extends into the  inferior aspect of the right T2-3 neural foramen  as well as superior  aspect of the right T3-4 neural foramen. There is also abnormal  paravertebral soft tissue laterally and anteriorly as seen on CT.  Central ventral epidural material posterior to the T4 vertebral body  may represent tumor or hematoma. Mild T2 hyperintensity is present  in the spinal cord at the T3 level extending inferiorly to the T4-5  disc space level consistent with mild edema. Mild edema is present  in the posterior paraspinal  soft tissues of the upper thoracic  spine. No other vertebral body lesions are identified.  There is a moderate-sized central disc extrusion at T7-8 which  contacts and mildly flattens the ventral spinal cord with mild  spinal stenosis. A small left central disc protrusion is present at  T8-9 with at most minimal impression on the spinal cord. Massive  enlargement of the left thyroid lobe is partially visualized.  Mediastinal and lung findings are better demonstrated on recent  chest CT.  IMPRESSION:  1. T3 vertebral body mass with pathologic compression fracture and  epidural tumor resulting in moderate cord compression with evidence  of mild spinal cord edema.  2. T7-8 disc extrusion resulting in mild spinal stenosis.   MRI Head / C-Spine 9/23:  CLINICAL DATA: 48 year old female who presented with back pain and  lower extremity numbness, with continued inadequately explained  lower extremity weakness following Hospital discharge on 12/12/2013.  Initial encounter.  EXAM:  MRI HEAD WITHOUT AND WITH CONTRAST  MRI CERVICAL SPINE WITHOUT AND WITH CONTRAST  TECHNIQUE:  Multiplanar, multiecho pulse sequences of the brain and surrounding  structures, and cervical spine, to include the craniocervical  junction and cervicothoracic junction, were obtained without and  with intravenous contrast.  CONTRAST: 20 mL MultiHance.  COMPARISON: Lumbar MRI 12/10/2013.  FINDINGS:  MRI HEAD FINDINGS  Cerebral volume is normal. No restricted diffusion  to suggest acute  infarction. No midline shift, mass effect, evidence of mass lesion,  ventriculomegaly, extra-axial collection or acute intracranial  hemorrhage. Cervicomedullary junction and pituitary are within  normal limits. Major intracranial vascular flow voids are preserved.  Pearline Cables and white matter signal is within normal limits for age  throughout the brain. No abnormal enhancement identified, pulsation  artifact prominent on axial post-contrast images.  Large body habitus. Small right frontal scalp lipoma (series 10,  image 37). Visible internal auditory structures appear normal. Mild  right mastoid fluid. Minor paranasal sinus mucosal thickening.  Visualized orbit soft tissues are within normal limits. Partially  visible thyromegaly greater on the left, see below. Normal bone  marrow signal.  MRI CERVICAL SPINE FINDINGS  Large body habitus resulting in wrap artifact on axial images.  However, there is massive thyroid enlargement evident, with the left  lobe measuring at least 7.2 x 5.2 x 11.2 cm (AP by transverse by  CC). The trachea is displaced to the right at the thoracic inlet and  mildly compressed. There is other regional mass effect including  sheath. On the left carotid  Fairly preserved cervical lordosis. No marrow edema or evidence of  acute osseous abnormality. Cervicomedullary junction is within  normal limits. Spinal cord signal is within normal limits at all  visualized levels. No abnormal intradural enhancement.  C2-C3: Negative.  C3-C4: Disc osteophyte complex, facet and ligament flavum  hypertrophy. Along with a degree of congenital canal narrowing,  there is mild spinal stenosis and moderate biforaminal stenosis.  C4-C5: Mild uncovertebral and facet hypertrophy. Mild right C5  foraminal stenosis.  C5-C6: Circumferential disc osteophyte complex. Along with a degree  of congenital canal narrowing there is mild spinal stenosis.  Moderate right C6 foraminal  stenosis.  C6-C7: Negative except for mild facet hypertrophy.  C7-T1: Negative.  T1-T2: Negative.  Faintly visualized at the T3 level in the upper thoracic spine there  is a lobulated solidly enhancing mass which appears to extend into  the epidural space from the T3 vertebral body. This is inseparable  from the spinal cord which is likely compressed.  See series 2000  image 7 and series 1500, image 6. This is not included on axial  images.  IMPRESSION:  1. Faintly visible but aggressive appearing enhancing mass at the T3  level of the upper thoracic spine, likely with spinal cord  compression evaluate further are with thoracic MRI without and with  contrast.  2. Normal for age MRI appearance of the brain.  3. Massive thyroid enlargement, greater on the left. Left thyroid  goiter/mass measuring at least 7.2 x 5.2 x 11.2 cm. Mass effect at  the thoracic inlet including on the trachea.  4. Mild chronic disc and endplate degeneration in the cervical spine  at C3-C4 and C5-C6 resulting in borderline to mild spinal stenosis,  but no cervical spinal cord mass effect or signal abnormality.  Electronically Signed:  By: Lars Pinks M.D.  On: 12/14/2013 09:50   MRI L-Spine 9/19:  CLINICAL DATA: Back pain with bilateral leg numbness, worsening  with oral steroids.  EXAM:  MRI LUMBAR SPINE WITHOUT CONTRAST  TECHNIQUE:  Multiplanar, multisequence MR imaging of the lumbar spine was  performed. No intravenous contrast was administered.  COMPARISON: CT 12/09/2013  FINDINGS:  There is no degenerative disease at L2-3 or above. The discs are  normal in signal characteristics and morphology. The spinal canal  and foramina are widely patent. The distal cord and conus are normal  with conus tip at L1. There are small benign and insignificant  hemangiomas within the L2 and L3 vertebral bodies.  L3-4: Minimal desiccation of the disc. Mild circumferential bulging  slightly more prominent towards the  left. Bilateral facet  degeneration and hypertrophy. Mild narrowing of the lateral recesses  without apparent neural compression. Mild foraminal narrowing on the  left without apparent neural compression.  L4-5: Desiccation of the disc with circumferential protrusion.  Bilateral facet and ligamentous hypertrophy. Stenosis of both  lateral recesses without definite focal neural compression. Mild  foraminal narrowing bilaterally.  L5-S1: Desiccation of the disc with loss of height. Endplate  osteophytes and bulging of the disc more prominent towards the left.  Bilateral facet degeneration and hypertrophy left more than right.  Stenosis of the subarticular lateral recess on the left that could  compress the S1 nerve root. Mild foraminal narrowing left more than  right but without apparent compression of the exiting L5 nerve  roots.  Serpiginous fluid-filled structure in the left pelvis could  represent hydrosalpinx. This is not primarily or completely imaged.  IMPRESSION:  Degenerative disc disease and degenerative facet disease in the  lower lumbar spine that could be a cause of lumbago.  Lateral recess and foraminal narrowing at L3-4, L4-5 and L5-S1 as  described above that could be a cause of focal neural compression.  In general, this is more pronounced on the left than on the right.  In particular, there is potential for left S1 nerve root compression  in the subarticular lateral recess at L5-S1.  Electronically Signed  By: Nelson Chimes M.D.  On: 12/10/2013 08:45   CT L-Spine 9/18:  CLINICAL DATA: Low back pain with difficulty walking.  EXAM:  CT LUMBAR SPINE WITHOUT CONTRAST  TECHNIQUE:  Multidetector CT imaging of the lumbar spine was performed without  intravenous contrast administration. Multiplanar CT image  reconstructions were also generated.  COMPARISON: None.  FINDINGS:  There is no fracture or spondylolisthesis. There is moderately  severe disc space narrowing at  L5-S1 with slight vacuum phenomenon  at this level. There is moderate narrowing at L4-5. There  are  prominent anterior osteophytes at L2, L3, L4, and L5.  At T12-L1, there is moderate facet hypertrophy bilaterally. There is  no nerve root edema or effacement. No disc extrusion or stenosis.  At L1-2, there is moderate facet hypertrophy bilaterally. There is  mild diffuse disc bulging. There is no nerve root edema or  effacement. No disc extrusion or stenosis.  At L2-3, there is broad-based disc bulging and moderate facet  hypertrophy bilaterally. There is borderline narrowing of the thecal  sac with borderline spinal stenosis. No disc extrusion.  At L3-4, there is severe facet osteoarthritic change bilaterally,  more pronounced on the left than on the right. There is broad-based  disc bulging and moderate ligamentum flavum hypertrophy. These  findings in concert lead to moderate generalized spinal stenosis.  At L4-5, there is broad-based disc protrusion and severe facet  osteoarthritic change bilaterally. These findings in concert lead to  moderate generalized spinal stenosis. No frank disc extrusion.  At L5-S1, there is severe facet osteoarthritic change on the left  and moderate facet osteoarthritic change on the right. There is  broad-based disc protrusion. There is exit foraminal narrowing  bilaterally, more severe on the left than on the right, due to  osteophytic change. No frank disc extrusion or stenosis is seen at  this level.  There are no paraspinous lesions.  IMPRESSION:  Spinal stenosis at L3-4 and L4-5, multifactorial. Borderline spinal  stenosis at L2-3. There does appear to be a degree of congenital  narrowing of the canal which contributes to spinal stenosis.  Multilevel arthropathy.  No frank disc extrusion.  No fracture or spondylolisthesis.  Electronically Signed  By: Lowella Grip M.D.  On: 12/09/2013 13:50   CXR 9/13:  CLINICAL DATA: Upper back pain for 5  weeks. Dyspnea on exertion.  Nonsmoker. History of hypertension.  EXAM:  CHEST 2 VIEW  COMPARISON: 04/15/2006  FINDINGS:  The heart is mildly enlarged. There is elevation of right  hemidiaphragm, stable in appearance. There is streaky density  favored to be within the right lower lobe, consistent with  atelectasis or infiltrate. No pulmonary edema. No pleural effusions.  There are moderate degenerative changes in the thoracic spine.  IMPRESSION:  Right lower lobe atelectasis or infiltrate.  Electronically Signed  By: Shon Hale M.D.  On: 12/04/2013 19:26  Venous Duplex 9/24:  Summary:  - No evidence of deep vein thrombosis involving the right lower extremity. - No evidence of deep vein thrombosis involving the left lower extremity.   Surgical Pathology Report: 12/21/2013 Diagnosis Soft tissue mass, simple excision, Thoracic Epidural Tumor - METASTATIC CARCINOMA. PLEASE SEE COMMENT. Microscopic Comment There are clusters of malignant cells with abundant eosinophilic cytoplasm. A battery of immunostains were performed and the tumor cells show the following profile: Cytokeratin 7 - strongly positive Cytokeratin 8/18 - strongly positive TTF-1 - strongly positive. Thyroglobulin - negative CD10 - negative Cytokeratin 20 - negative S100 - negative Vimentin - negative The control stained appropriately. The overall findings are diagnostic for metastatic carcinoma. 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 However, a metastatic oncocytic thyroid carcinoma is still in the differential. Clinical and radiographic correlation is highly recommended. (HCL:kh 12-19-13) Aldona Bar MD Pathologist, Electronic Signature (Case signed 12/20/2013)  Elberta Leatherwood, MD 01/16/2014, 3:14 PM PGY-1 [place] Intern pager: [phone], text pages welcome

## 2014-01-17 ENCOUNTER — Ambulatory Visit
Admit: 2014-01-17 | Discharge: 2014-01-17 | Disposition: A | Discharge status: Home / Self Care | Payer: Medicaid Other | Attending: Radiation Oncology | Admitting: Radiation Oncology

## 2014-01-17 DIAGNOSIS — C7951 Secondary malignant neoplasm of bone: Secondary | ICD-10-CM

## 2014-01-17 LAB — GLUCOSE, CAPILLARY
GLUCOSE-CAPILLARY: 110 mg/dL — AB (ref 70–99)
GLUCOSE-CAPILLARY: 119 mg/dL — AB (ref 70–99)
Glucose-Capillary: 131 mg/dL — ABNORMAL HIGH (ref 70–99)
Glucose-Capillary: 106 mg/dL — ABNORMAL HIGH (ref 70–99)

## 2014-01-17 MED ORDER — POLYETHYLENE GLYCOL 3350 17 G PO PACK
17.0000 g | PACK | Freq: Every day | ORAL | Status: DC | PRN
  Administered 2014-01-17: 17 g via ORAL
  Filled 2014-01-17: qty 1

## 2014-01-17 MED ORDER — LORAZEPAM 0.5 MG PO TABS
0.5000 mg | ORAL_TABLET | Freq: Four times a day (QID) | ORAL | Status: DC | PRN
  Administered 2014-01-18 – 2014-01-19 (×3): 0.5 mg via ORAL
  Filled 2014-01-17 (×3): qty 1

## 2014-01-17 MED ORDER — POLYETHYLENE GLYCOL 3350 17 G PO PACK
17.0000 g | PACK | Freq: Two times a day (BID) | ORAL | Status: DC | PRN
  Administered 2014-01-18: 17 g via ORAL
  Filled 2014-01-17: qty 1

## 2014-01-17 NOTE — Progress Notes (Signed)
FMTS Attending Note Patient seen and examined by me, discussed with resident team and I agree with intern progress note for today.  Dalbert Mayotte, MD

## 2014-01-17 NOTE — Progress Notes (Signed)
Pt returned via stretcher transported via [facility] alert, verbal with no complaints voiced. Pt stable with no noted distress. Neuro check done baseline intact. Pt tolerated Oncology procedure without difficulty.  Will be returning tomorrow for another treatment at [facility].

## 2014-01-17 NOTE — Progress Notes (Signed)
OT Cancellation Note  Patient Details Name: Sarah [name] MRN: [id number] DOB: 1965-04-11   Cancelled Treatment:    Reason Eval/Treat Not Completed: Patient at procedure or test/ unavailable, note pt. Scheduled for CT sim. At [facility] Today.  Will check back on pt. As able.   Janice Coffin, COTA/L 01/17/2014, 11:38 AM

## 2014-01-17 NOTE — Progress Notes (Signed)
Tech reported patient to have bloody stool. Nurse assessed patient positive for hemorrhoids and patient reported straining. Bowel movement loose but formed and brown in color. Paged famiily medicine and no immediate orders given. Did give an order to increase Miralax PRN. Will continue to monitor per shift.

## 2014-01-17 NOTE — Progress Notes (Signed)
Family Medicine Teaching Service Daily Progress Note Intern Pager: [phone]  Patient name: Sarah [name] Medical record number: [id number] Date of birth: 30-Nov-1965 Age: 48 y.o. Gender: female  Primary Care Provider: No PCP Per Patient Consultants: Neurosurgery, IR, Oncology, Endocrinology, Rad Onc Code Status: Full  Pt Overview and Major Events to Date:  - 9/22: Pt. Admitted with lower extremity weakness.  - 9/23: Pt. Found to have mass at T-3 level with spinal compression.  - 9/24: Surgery performed for decompression and biopsy overnight. Pt. Post-op and doing well. 3cm Suprahilar mass on CT Chest, PE also identified. ?Thyroid etiology, but much less likely for mets. Oncology consulted.  - 9/25 - 9/29: Awaiting pathology results with plan for oncology to see once path returns. Pt. With little neurological improvement. Started with light PT.  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 Oncology on board. Neurosurgery to comment on need for spinal stabilization surgery.  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 - 10/8: Underwent spinal stabilization surg. - 10/10: Restarted heparin  - 10/13: Dr. GerkiHarlow Asale to perform surg as inpatient; will seek other consultations - 10/14: Dr. RosenConstance HolsterNT consulted. He will see pt on 10/15. - 10/16: ENT to perform Thyroidectomy Monday 10/19 - 10/17: All cell lines (WBC, Hgb, Plts) continue to drop: Checking Labs as below - 10/18: Midnight, holding heparin in preparation for surgery 10/19 - 10/19: Total thyroidectomy - 10/22: [facility] - 10/26: Transitioned to Lovenox; CIR consult placed - still pending; medically stable for DC to  CIR  Assessment and Plan: Sarah [name] 48 y.48 female presenting with continued lower extremity weakness at baseline after discharge from hospital on 9/21. PMH is significant for obesity, HTN, and ?History of Hypothyroidism previously on Synthroid per patient.   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  Neurosurgery took her to the OR on 9/23, and biopsied as well as performed laminectomy for decompression. Surgical pathology with metastatic adenocarcinoma. Likely poor prognosis.  - Neurosurgery (Dr. CabbeChristella Noalowing: Patient was taken for spinal stabilization 10/9.  - Oncology following (Dr. MagriJana Hakimpatient; Dr. 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 Large thyroid goiter noted. TSH normal; ultrasound on 10/1, Biopsy on 10/2 - FNA results: "clusters of neoplastic cells w/ granular/oncogenic cytoplasm--similar to previous bx" - Dr. RosenConstance HolsterT) following. Greatly appreciate the input. - Endocrinology note with great recs (Dr. Kerr)Buddy Dutyill need to begin radioiodine therapy and external beam radiation when deemed appropriate by oncology.  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  PT recommendations CIR  vs SNF with 24hour supervision. Will plan dispo based on goals of care discussion. - Foley catheter in place for urinary retention;  Stooling regularly, no sxs of fecal retention.  - Discussed Recs for PT with Neurosurgery. Light PT at this point. Pt. Not to get UOB.  - TED hose  #Pulmonary Embolus - Found on CT Chest  Pt. With brief episodes of tachycardia, but otherwise asymptomatic with O2 sats 100% on 2L [place] >> now on Room Air.  Blood pressures stable. Lower extremity Venous Duplex negative for DVT.  - Hypercoagulable 2/2 neoplasia . ESR 59 (9/22)  - Transitioned to Lovenox 10/26; will monitor platelets - Will continue to monitor VS. Stable at this time.  # Anemia and Thrombocytopenia: Improving. Hgb and Plts have been trending down slowly since spinal stabilization surgery on 10/7, now uptrending.  No signs of end organ damage so low suspicion for TTP, DIC - FOBT - negative - Continue to follow CBC - HIT panel: neg - DC Argatroban;  transitioned to Lovenox yesterday - will monitor  #Hyperglycemia -  Likely diabetic with PCOS given A1C of 6.7. CBG's 110s-140s.  - Patient informed of elevated A1C and likely diagnosis of DMII. She agrees with [place] that this is a marginal issue at this point, and we will recommend treatment based upon our goals of care discussion.  - Continue to monitor.  - Resistant SSI - may need to decrease to mod SSI  #Hypervirilization - with clitoromegaly noted on prior exam and buffalo hump and obesity as well as upper lip hair. PCOS vs. Adrenal patholog. Hypertensive and not hypotensive. Testing during this hospitalization after having recently received steroids would be largely non-diagnostic. Though, could also be a combination of isolated processes i.e. Clitoromegaly, Hirsutism 2/2 obesity. - Will leave for Outpatient f/u -- [place] to assess ovaries vs 21-hydroxylase/ACTH/cortisol.  - TSH, Cortisol WNL - A1C : 6.7 - consistent with PCOS given evidence of cystic structures in pelvis.   # UTI, 2/2 foley catheter - (10/2)UCx = Proteus - Resolved s/p 7d course of Cipro; (10/20)UCx = E Coli, resistant to  cipro - dysuria and suprapubic pain noted PM 10/1. - 10/23 started on Keflex x 7 days  #HTN- BP well controlled -continue losartan.   #Obesity- consistent with potential PCOS.   FEN/GI: Reg diet; KVO Prophylaxis: Lovenox, SCDs  Disposition: Pending specialist input, likely to CIR this week if bed is available.  Subjective: [name] doing well. Reports significant improvement and motivation w/ physical therapy. Patient looking forward to CT sim today; mostly b/c it allows her to leave hospital, which she hasn't done in over a month.  Objective: Temp:  [98.1 F (36.7 C)-98.6 F (37 C)] 98.4 F (36.9 C) (10/27 0931) Pulse Rate:  [87-93] 89 (10/27 0931) Resp:  [18-20] 20 (10/27 0931) BP: (127-150)/(64-81) 127/64 mmHg (10/27 0931) SpO2:  [99 %-100 %] 100 % (10/27 0931) Physical Exam: General: NAD, AAOx3, morbidly obese  HEENT: AT/[place],  neck supple, surgical incision noted w/o concern for infection Cardio: distant heart sounds due to habitus Lungs: Normal effort, CTA bilateral anteriorly. Abdomen: Soft and nontender, nondistended, No organomegaly noted.  Extremities: no edema,  No calf tenderness Skin: No lesions or rashes noted Neuro: cranial nerves grossly normal. Normal speech.   Laboratory:  Recent Labs Lab 01/14/14 0501 01/15/14 0520 01/16/14 0547  WBC 4.2 4.8 5.0  HGB 8.1* 8.4* 8.7*  HCT 24.6* 25.2* 25.7*  PLT 280 316 331    Recent Labs Lab 01/12/14 0505  01/13/14 0855 01/14/14 0501  NA 138 142 141  K 3.6* 3.7 3.3*  CL 98 101 101  CO2 _0 BUN _1 CREATININE 0.69 0.69 0.76  CALCIUM 8.2* 8.0* 7.8*  PROT  --   --  5.5*  BILITOT  --   --  0.5  ALKPHOS  --   --  140*  ALT  --   --  18  AST  --   --  18  GLUCOSE 108* 109* 110*   Urinalysis    Component Value Date/Time   COLORURINE YELLOW 01/09/2014 2215   APPEARANCEUR TURBID* 01/09/2014 2215   LABSPEC 1.004* 01/09/2014 2215   PHURINE 7.5 01/09/2014 2215   GLUCOSEU NEGATIVE 01/09/2014 2215    HGBUR LARGE* 01/09/2014 2215   BILIRUBINUR NEGATIVE 01/09/2014 2215   KETONESUR 15* 01/09/2014 2215   PROTEINUR NEGATIVE 01/09/2014 2215   UROBILINOGEN 0.2 01/09/2014 2215   NITRITE POSITIVE* 01/09/2014 2215   LEUKOCYTESUR LARGE* 01/09/2014 2215    Phos - 3.9 Mg - 2.2 CRP - 11.6 ESR - 59 CK - 56 B12 - 209 Random Cortisol - 25.4 A1C - 6.7 TSH - 2.13 RPR - nonreactive HIV - nonreactive UPT - Negative  Imaging/Diagnostic Tests:  CT Chest Abd/ Pelvis 9/23:  CLINICAL DATA: Destructive metastatic lesion of the T3 vertebral  body with no known primary carcinoma.  EXAM:  CT CHEST, ABDOMEN, AND PELVIS WITH CONTRAST  TECHNIQUE:  Multidetector CT imaging of the chest, abdomen and pelvis was  performed following the standard protocol during bolus  administration of intravenous contrast.  CONTRAST: 175m OMNIPAQUE IOHEXOL 300 MG/ML SOLN  COMPARISON: MRI of the cervical spine earlier today.  FINDINGS:  CT CHEST FINDINGS  Within the chest, a right suprahilar mass measures approximately 3.0  x 2.7 x 2.2 cm. This either represents a nodal metastasis or central  lung mass such as small cell carcinoma. A number of smaller  mediastinal lymph nodes are also identified in the right  paratracheal region, prevascular space and AP window.  Destructive lesion of the T3 vertebral body noted which nearly  replaces the entire vertebral body with tumor extension posteriorly  into the epidural space causing significant compression of the  spinal cord. Paravertebral extension of tumor is also suspected  lateral and anterior to the vertebral body, right greater than left.  Massive enlargement of the thyroid gland present with transverse  dimensions of approximately 7.7 x 8.5 cm. The thyroid gland is  incompletely visualized as the neck was not included. Central  dystrophic calcification identified. The enlarged thyroid gland does  cause significant mass effect on the airway with evidence of   tracheal deviation to the right and tracheal narrowing at the  thoracic inlet. The enlarged thyroid gland extends into the superior  mediastinum.  There is evidence of pulmonary embolism with nonocclusive thrombus  identified in the right middle lobe and lower lobe pulmonary  arteries. Pulmonary arterial opacification is not optimal on the  standard venous phase evaluation of the chest but the right sided  pulmonary embolism is clearly present.  There are small bilateral pulmonary nodules. Right-sided pulmonary  nodule measuring 6 mm likely localizes to the very top of the lower  lobe near the major fissure. Left upper lobe nodule measures 7 mm.  Lingular nodule measures 7 mm. Left lower lobe nodule measures 7 mm.  Faint peripheral right middle lobe nodule measures 3 mm. Inferior  right middle lobe nodule measures 6 mm. Several small  nodules in the  posterior right upper lobe identified in the 3-4 mm range.  No evidence of pleural or pericardial fluid. No other bony lesions  identified in the thorax.  CT ABDOMEN AND PELVIS FINDINGS  Small low-density lesion in the left lobe of the liver measures 7 mm  and likely represents a cyst. No other hepatic abnormalities. The  gallbladder, pancreas, spleen, adrenal glands and kidneys are  unremarkable. No adenopathy is identified in the abdomen or pelvis.  Bowel shows normal caliber without evidence of obstruction, lesion  or inflammation. No abnormal fluid collections.  The bladder is moderately distended. Elongated cystic changes in  both adnexal regions identified which may represent a combination of  ovarian cysts and potentially hydrosalpinx. Correlation with pelvic  ultrasound is recommended. No hernias are identified. The uterus  appears unremarkable by CT. No bony lesions are identified in the  abdomen or pelvis.  IMPRESSION:  1. Destructive T3 metastasis as seen on MRI nearly completely  replaces the vertebral body and causes  significant spinal cord  compression with epidural extension of tumor.  2. 3 cm right suprahilar mass may represent nodal metastasis versus  primary central tumor. Next number multiple small bilateral  pulmonary nodules consistent with metastatic disease.  3. Acute nonocclusive pulmonary embolism in the right middle lobe  and right lower lobe pulmonary arteries.  4. Massive thyroid goiter causing tracheal deviation to the right  and narrowing of the trachea.  5. Tubular cystic abnormalities of both adnexal regions which may be  a combination of cystic changes in the ovaries and potentially  hydrosalpinx. Correlation recommended with pelvic ultrasound to  exclude any suspicious cystic lesions of the adnexae.  6. Critical Value/emergent results were called by telephone at the  time of interpretation on 12/14/2013 at 4:52 pm to Dr. Tamala Julian of the  [facility] Medicine Teaching Service, who verbally acknowledged these  results.  Electronically Signed  By: Aletta Edouard M.D.  On: 12/14/2013 16:53   MRI T-spine 9/23:  CLINICAL DATA: Lower extremity weakness. Worsening back pain.  Compressive upper thoracic mass partially visualized on cervical  spine MRI as well as chest CT with spinal cord compression.  EXAM:  MRI THORACIC SPINE WITHOUT CONTRAST  TECHNIQUE:  Multiplanar, multisequence MR imaging of the thoracic spine was  performed. No intravenous contrast was administered.  COMPARISON: Cervical spine MRI and chest CT earlier today  FINDINGS:  There is a T3 compression fracture with approximately 50% height  loss centrally. As described on cervical spine MRI and chest CT  earlier today, there is abnormal soft tissue replacing the majority  of the T3 vertebral body extending into the pedicles as well as into  the ventral epidural space with moderate cord compression. Epidural  extension is slightly greater on the right and extends into the  inferior aspect of the right T2-3 neural foramen  as well as superior  aspect of the right T3-4 neural foramen. There is also abnormal  paravertebral soft tissue laterally and anteriorly as seen on CT.  Central ventral epidural material posterior to the T4 vertebral body  may represent tumor or hematoma. Mild T2 hyperintensity is present  in the spinal cord at the T3 level extending inferiorly to the T4-5  disc space level consistent with mild edema. Mild edema is present  in the posterior paraspinal soft tissues of the upper thoracic  spine. No other vertebral body lesions are identified.  There is a moderate-sized central disc extrusion at T7-8 which  contacts and  mildly flattens the ventral spinal cord with mild  spinal stenosis. A small left central disc protrusion is present at  T8-9 with at most minimal impression on the spinal cord. Massive  enlargement of the left thyroid lobe is partially visualized.  Mediastinal and lung findings are better demonstrated on recent  chest CT.  IMPRESSION:  1. T3 vertebral body mass with pathologic compression fracture and  epidural tumor resulting in moderate cord compression with evidence  of mild spinal cord edema.  2. T7-8 disc extrusion resulting in mild spinal stenosis.   MRI Head / C-Spine 9/23:  CLINICAL DATA: 48 year old female who presented with back pain and  lower extremity numbness, with continued inadequately explained  lower extremity weakness following Hospital discharge on 12/12/2013.  Initial encounter.  EXAM:  MRI HEAD WITHOUT AND WITH CONTRAST  MRI CERVICAL SPINE WITHOUT AND WITH CONTRAST  TECHNIQUE:  Multiplanar, multiecho pulse sequences of the brain and surrounding  structures, and cervical spine, to include the craniocervical  junction and cervicothoracic junction, were obtained without and  with intravenous contrast.  CONTRAST: 20 mL MultiHance.  COMPARISON: Lumbar MRI 12/10/2013.  FINDINGS:  MRI HEAD FINDINGS  Cerebral volume is normal. No restricted diffusion  to suggest acute  infarction. No midline shift, mass effect, evidence of mass lesion,  ventriculomegaly, extra-axial collection or acute intracranial  hemorrhage. Cervicomedullary junction and pituitary are within  normal limits. Major intracranial vascular flow voids are preserved.  Pearline Cables and white matter signal is within normal limits for age  throughout the brain. No abnormal enhancement identified, pulsation  artifact prominent on axial post-contrast images.  Large body habitus. Small right frontal scalp lipoma (series 10,  image 37). Visible internal auditory structures appear normal. Mild  right mastoid fluid. Minor paranasal sinus mucosal thickening.  Visualized orbit soft tissues are within normal limits. Partially  visible thyromegaly greater on the left, see below. Normal bone  marrow signal.  MRI CERVICAL SPINE FINDINGS  Large body habitus resulting in wrap artifact on axial images.  However, there is massive thyroid enlargement evident, with the left  lobe measuring at least 7.2 x 5.2 x 11.2 cm (AP by transverse by  CC). The trachea is displaced to the right at the thoracic inlet and  mildly compressed. There is other regional mass effect including  sheath. On the left carotid  Fairly preserved cervical lordosis. No marrow edema or evidence of  acute osseous abnormality. Cervicomedullary junction is within  normal limits. Spinal cord signal is within normal limits at all  visualized levels. No abnormal intradural enhancement.  C2-C3: Negative.  C3-C4: Disc osteophyte complex, facet and ligament flavum  hypertrophy. Along with a degree of congenital canal narrowing,  there is mild spinal stenosis and moderate biforaminal stenosis.  C4-C5: Mild uncovertebral and facet hypertrophy. Mild right C5  foraminal stenosis.  C5-C6: Circumferential disc osteophyte complex. Along with a degree  of congenital canal narrowing there is mild spinal stenosis.  Moderate right C6 foraminal  stenosis.  C6-C7: Negative except for mild facet hypertrophy.  C7-T1: Negative.  T1-T2: Negative.  Faintly visualized at the T3 level in the upper thoracic spine there  is a lobulated solidly enhancing mass which appears to extend into  the epidural space from the T3 vertebral body. This is inseparable  from the spinal cord which is likely compressed. See series 2000  image 7 and series 1500, image 6. This is not included on axial  images.  IMPRESSION:  1. Faintly visible but aggressive appearing enhancing  mass at the T3  level of the upper thoracic spine, likely with spinal cord  compression evaluate further are with thoracic MRI without and with  contrast.  2. Normal for age MRI appearance of the brain.  3. Massive thyroid enlargement, greater on the left. Left thyroid  goiter/mass measuring at least 7.2 x 5.2 x 11.2 cm. Mass effect at  the thoracic inlet including on the trachea.  4. Mild chronic disc and endplate degeneration in the cervical spine  at C3-C4 and C5-C6 resulting in borderline to mild spinal stenosis,  but no cervical spinal cord mass effect or signal abnormality.  Electronically Signed:  By: Lars Pinks M.D.  On: 12/14/2013 09:50   MRI L-Spine 9/19:  CLINICAL DATA: Back pain with bilateral leg numbness, worsening  with oral steroids.  EXAM:  MRI LUMBAR SPINE WITHOUT CONTRAST  TECHNIQUE:  Multiplanar, multisequence MR imaging of the lumbar spine was  performed. No intravenous contrast was administered.  COMPARISON: CT 12/09/2013  FINDINGS:  There is no degenerative disease at L2-3 or above. The discs are  normal in signal characteristics and morphology. The spinal canal  and foramina are widely patent. The distal cord and conus are normal  with conus tip at L1. There are small benign and insignificant  hemangiomas within the L2 and L3 vertebral bodies.  L3-4: Minimal desiccation of the disc. Mild circumferential bulging  slightly more prominent towards the  left. Bilateral facet  degeneration and hypertrophy. Mild narrowing of the lateral recesses  without apparent neural compression. Mild foraminal narrowing on the  left without apparent neural compression.  L4-5: Desiccation of the disc with circumferential protrusion.  Bilateral facet and ligamentous hypertrophy. Stenosis of both  lateral recesses without definite focal neural compression. Mild  foraminal narrowing bilaterally.  L5-S1: Desiccation of the disc with loss of height. Endplate  osteophytes and bulging of the disc more prominent towards the left.  Bilateral facet degeneration and hypertrophy left more than right.  Stenosis of the subarticular lateral recess on the left that could  compress the S1 nerve root. Mild foraminal narrowing left more than  right but without apparent compression of the exiting L5 nerve  roots.  Serpiginous fluid-filled structure in the left pelvis could  represent hydrosalpinx. This is not primarily or completely imaged.  IMPRESSION:  Degenerative disc disease and degenerative facet disease in the  lower lumbar spine that could be a cause of lumbago.  Lateral recess and foraminal narrowing at L3-4, L4-5 and L5-S1 as  described above that could be a cause of focal neural compression.  In general, this is more pronounced on the left than on the right.  In particular, there is potential for left S1 nerve root compression  in the subarticular lateral recess at L5-S1.  Electronically Signed  By: Nelson Chimes M.D.  On: 12/10/2013 08:45   CT L-Spine 9/18:  CLINICAL DATA: Low back pain with difficulty walking.  EXAM:  CT LUMBAR SPINE WITHOUT CONTRAST  TECHNIQUE:  Multidetector CT imaging of the lumbar spine was performed without  intravenous contrast administration. Multiplanar CT image  reconstructions were also generated.  COMPARISON: None.  FINDINGS:  There is no fracture or spondylolisthesis. There is moderately  severe disc space narrowing at  L5-S1 with slight vacuum phenomenon  at this level. There is moderate narrowing at L4-5. There are  prominent anterior osteophytes at L2, L3, L4, and L5.  At T12-L1, there is moderate facet hypertrophy bilaterally. There is  no nerve root edema or effacement.  No disc extrusion or stenosis.  At L1-2, there is moderate facet hypertrophy bilaterally. There is  mild diffuse disc bulging. There is no nerve root edema or  effacement. No disc extrusion or stenosis.  At L2-3, there is broad-based disc bulging and moderate facet  hypertrophy bilaterally. There is borderline narrowing of the thecal  sac with borderline spinal stenosis. No disc extrusion.  At L3-4, there is severe facet osteoarthritic change bilaterally,  more pronounced on the left than on the right. There is broad-based  disc bulging and moderate ligamentum flavum hypertrophy. These  findings in concert lead to moderate generalized spinal stenosis.  At L4-5, there is broad-based disc protrusion and severe facet  osteoarthritic change bilaterally. These findings in concert lead to  moderate generalized spinal stenosis. No frank disc extrusion.  At L5-S1, there is severe facet osteoarthritic change on the left  and moderate facet osteoarthritic change on the right. There is  broad-based disc protrusion. There is exit foraminal narrowing  bilaterally, more severe on the left than on the right, due to  osteophytic change. No frank disc extrusion or stenosis is seen at  this level.  There are no paraspinous lesions.  IMPRESSION:  Spinal stenosis at L3-4 and L4-5, multifactorial. Borderline spinal  stenosis at L2-3. There does appear to be a degree of congenital  narrowing of the canal which contributes to spinal stenosis.  Multilevel arthropathy.  No frank disc extrusion.  No fracture or spondylolisthesis.  Electronically Signed  By: Lowella Grip M.D.  On: 12/09/2013 13:50   CXR 9/13:  CLINICAL DATA: Upper back pain for 5  weeks. Dyspnea on exertion.  Nonsmoker. History of hypertension.  EXAM:  CHEST 2 VIEW  COMPARISON: 04/15/2006  FINDINGS:  The heart is mildly enlarged. There is elevation of right  hemidiaphragm, stable in appearance. There is streaky density  favored to be within the right lower lobe, consistent with  atelectasis or infiltrate. No pulmonary edema. No pleural effusions.  There are moderate degenerative changes in the thoracic spine.  IMPRESSION:  Right lower lobe atelectasis or infiltrate.  Electronically Signed  By: Shon Hale M.D.  On: 12/04/2013 19:26  Venous Duplex 9/24:  Summary:  - No evidence of deep vein thrombosis involving the right lower extremity. - No evidence of deep vein thrombosis involving the left lower extremity.   Surgical Pathology Report: 12/21/2013 Diagnosis Soft tissue mass, simple excision, Thoracic Epidural Tumor - METASTATIC CARCINOMA. PLEASE SEE COMMENT. Microscopic Comment There are clusters of malignant cells with abundant eosinophilic cytoplasm. A battery of immunostains were performed and the tumor cells show the following profile: Cytokeratin 7 - strongly positive Cytokeratin 8/18 - strongly positive TTF-1 - strongly positive. Thyroglobulin - negative CD10 - negative Cytokeratin 20 - negative S100 - negative Vimentin - negative The control stained appropriately. The overall findings are diagnostic for metastatic carcinoma. 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 However, a metastatic oncocytic thyroid carcinoma is still in the differential. Clinical and radiographic correlation is highly recommended. (HCL:kh 12-19-13) Aldona Bar MD Pathologist, Electronic Signature (Case signed 12/20/2013)  Elberta Leatherwood, MD 01/17/2014, 12:42 PM PGY-1 [facility] Intern pager: [phone], text pages welcome

## 2014-01-17 NOTE — Progress Notes (Signed)
Pt leaving facility at this time via stretcher transported via [facility] to [facility] for Oncology procedure. Pt alert, verbal and stable with no noted distress. Denies pain or discomfort. Tolerated meds without difficulty. Pt to return after procedure.

## 2014-01-17 NOTE — Progress Notes (Signed)
Inpatient Rehabilitation  I met with the patient, her mom and her sister at he bedside to discuss her post acute rehab needs.  I provided educational booklets and answered patient and family questions.  They all agree that CIR is the best place for Sarah [name] to receive her rehab and would like to pursue this when medically ready.   I will follow up tomorrow for medical readiness.  Please call if questions.  [place] Admissions Coordinator Cell [phone] Office [phone]

## 2014-01-17 NOTE — Procedures (Signed)
  Radiation Oncology         [phone] ________________________________    Name: SYRIA KESTNER MRN: [id number]  Date: 12/12/2013  DOB: May 05, 1965  INPATIENT   SIMULATION AND TREATMENT PLANNING NOTE    ICD-9-CM ICD-10-CM  13. 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  Identity was confirmed.  All relevant records and images related to the planned course of therapy were reviewed.  The patient freely provided informed written consent to proceed with treatment after reviewing the details related to the planned course of therapy. The consent form was witnessed and verified by the simulation staff.  Then, the patient was set-up in a stable reproducible  supine position for radiation therapy.  CT images were obtained.  Surface markings were placed.  The CT images were loaded into the planning software.  Then the target and avoidance structures were contoured.  Treatment planning then occurred.  The radiation prescription was entered and confirmed.  Then, I designed and supervised the construction of a total of 4 medically necessary complex treatment devices in the form of MLC apertures shaping radiation around T2-T4 shielding the lungs using, AP, PA, and right and left posterior obliques to cover the spine, with the obliques reducing dose to the recent posterior incision to prevent wound dehiscence.  I have requested : Isodose Plan.    PLAN:  The patient will receive 30 Gy in 10 fractions.  ________________________________  [name] [name], M.D.

## 2014-01-17 NOTE — Clinical Social Work Note (Signed)
Clinical Social Worker continues to follow patient for confirmation on disposition.   Glendon Axe, MSW, LCSWA [phone] 01/17/2014 4:29 PM

## 2014-01-18 ENCOUNTER — Ambulatory Visit
Admit: 2014-01-18 | Discharge: 2014-01-18 | Disposition: A | Discharge status: Home / Self Care | Payer: Medicaid Other | Attending: Radiation Oncology | Admitting: Radiation Oncology

## 2014-01-18 DIAGNOSIS — C801 Malignant (primary) neoplasm, unspecified: Secondary | ICD-10-CM

## 2014-01-18 DIAGNOSIS — M545 Low back pain: Secondary | ICD-10-CM

## 2014-01-18 DIAGNOSIS — C7951 Secondary malignant neoplasm of bone: Secondary | ICD-10-CM

## 2014-01-18 LAB — CBC
HEMATOCRIT: 26.7 % — AB (ref 36.0–46.0)
HEMOGLOBIN: 8.9 g/dL — AB (ref 12.0–15.0)
MCH: 29.2 pg (ref 26.0–34.0)
MCHC: 33.3 g/dL (ref 30.0–36.0)
MCV: 87.5 fL (ref 78.0–100.0)
Platelets: 340 10*3/uL (ref 150–400)
RBC: 3.05 MIL/uL — ABNORMAL LOW (ref 3.87–5.11)
RDW: 15.4 % (ref 11.5–15.5)
WBC: 6.6 10*3/uL (ref 4.0–10.5)

## 2014-01-18 LAB — GLUCOSE, CAPILLARY
Glucose-Capillary: 114 mg/dL — ABNORMAL HIGH (ref 70–99)
Glucose-Capillary: 108 mg/dL — ABNORMAL HIGH (ref 70–99)
Glucose-Capillary: 108 mg/dL — ABNORMAL HIGH (ref 70–99)
Glucose-Capillary: 111 mg/dL — ABNORMAL HIGH (ref 70–99)

## 2014-01-18 MED ORDER — LORAZEPAM 0.5 MG PO TABS: 0.5000 mg | ORAL_TABLET | Freq: Four times a day (QID) | ORAL | Status: DC | PRN

## 2014-01-18 MED ORDER — SODIUM CHLORIDE 0.9 % IV SOLN: 10.0000 mL | INTRAVENOUS | Status: DC

## 2014-01-18 MED ORDER — HYDROCORTISONE 2.5 % RE CREA
TOPICAL_CREAM | Freq: Two times a day (BID) | RECTAL | Status: DC
  Administered 2014-01-18 – 2014-01-19 (×3): via RECTAL
  Filled 2014-01-18: qty 28.35

## 2014-01-18 MED ORDER — LEVOTHYROXINE SODIUM 125 MCG PO TABS: 250.0000 ug | ORAL_TABLET | Freq: Every day | ORAL | Status: DC

## 2014-01-18 MED ORDER — ACETAMINOPHEN 325 MG PO TABS: 650.0000 mg | ORAL_TABLET | ORAL | Status: DC | PRN

## 2014-01-18 MED ORDER — ACETAMINOPHEN 650 MG RE SUPP: 650.0000 mg | RECTAL | Status: DC | PRN

## 2014-01-18 MED ORDER — FLEET ENEMA 7-19 GM/118ML RE ENEM
1.0000 | ENEMA | Freq: Every day | RECTAL | Status: DC | PRN
  Administered 2014-01-18: 17:00:00 via RECTAL
  Filled 2014-01-18: qty 1

## 2014-01-18 MED ORDER — CEPHALEXIN 500 MG PO CAPS: 500.0000 mg | ORAL_CAPSULE | Freq: Four times a day (QID) | ORAL | Status: AC

## 2014-01-18 NOTE — PMR Pre-admission (Signed)
PMR Admission Coordinator Pre-Admission Assessment  Patient: Sarah [name] is an 48 y.o., female MRN: [id number] DOB: November 02, 1965 Height: 5\' 11"  (180.3 cm) Weight: 142.4 kg (313 lb 15 oz)              Insurance Information HMO:     PPO:      PCP:      IPA:      80/20:      OTHER:  PRIMARY:  Medicaid pending per pt's Mom Dub Mikes and sister [name]; also per family, pt. 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 female with history of HTN, chronic back pain, morbid obesity who was admitted on 12/10/13 with BLE weakness with inability to walk as well as problems with urinary retention and constipation. Work up revealed a lesion at the T3 level of the upper thoracic spine with pathologic compression fracture and epidural tumor resulting in moderate cord compression with evidence of mild spinal cord edema, as well as right suprahilar mass with pulmonary nodules with mediastinal lymph nodes c/w metastatic disease, RUL & RLL PE, tubular cystic masses bilateral adnexal regions and a massive thyroid goiter causing tracheal deviation to the right with narrowing of trachea. She was started on steriods and foley placed for urinary retention. She underwent thoracic laminectomy with excision of tumor on 09/23 by Dr. Christella Noa and on bedrest pending further surgery. Pathology favoring metastatic adenocarcinoma of lung but metastatic thyroid  cancer in differential . 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 Buddy Duty consulted for input. 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 Patient underwent T1-T5 posterolateral arthrodesis with allograft on 10/09 with plans XRT to be initiated 2 weeks post op. Dr. Constance Holster consulted for input and patient underwent total thyroidectomy on 10/19 and extubated without difficulty on 10/20. 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 She was changed back to Lovenox as platelets have stabilized. Dr. Hosie Poisson recommends transition to Xarelto once all surgeries completed.  She continues to have paraparesis and therapy working on bed mobility as well as sitting balance at edge of bed. Recurrent UTI treated with Keflex. Foley remains in place and bowel incontinence reported as well as constipation issues reported. Dr. [name] consulted for XRT to spine for pain palliation as well as prevention of further spinal cord injury. Had simulation on 10/27--10 treatments started on 01/18/14. Right hilar mass without clear pathologic diagnosis and patient to follow up with Dr. Lamonte Sakai in 1-2 months on outpatient basis. Dr. Earlie Server to follow for input on chemotherapy on metastatic Hurtle cell cancer?   Past Medical History  Past Medical History  Diagnosis Date  . Hypertension   .  DDD (degenerative disc disease), cervical   . DJD (degenerative joint disease)   . Obesity   . Chronic back pain   . Cancer     FNA of thyroid positive for onconytic/hurthle cell carcinoma    Family History  family history includes Diabetes in her mother; Hypertension in her father and mother; Stroke in her father.  Prior  Rehab/Hospitalizations: none prior to onset of current problmes   Current Medications  Current facility-administered medications:acetaminophen (TYLENOL) suppository 650 mg, 650 mg, Rectal, Q4H PRN, Ashok Pall, MD;  acetaminophen (TYLENOL) tablet 650 mg, 650 mg, Oral, Q4H PRN, Ashok Pall, MD, 650 mg at 01/19/14 0525;  albuterol (PROVENTIL) (2.5 MG/3ML) 0.083% nebulizer solution 2.5 mg, 2.5 mg, Inhalation, Q6H PRN, Hilton Sinclair, MD antiseptic oral rinse (CPC / CETYLPYRIDINIUM CHLORIDE 0.05%) solution 7 mL, 7 mL, Mouth Rinse, QID, Collene Gobble, MD, 7 mL at 01/19/14 1200;  cephALEXin (KEFLEX) capsule 500 mg, 500 mg, Oral, QID, Dickie La, MD, 500 mg at 01/19/14 1040;  enoxaparin (LOVENOX) injection 140 mg, 140 mg, Subcutaneous, Q12H, Dickie La, MD, 140 mg at 01/19/14 0449;  hydrocortisone (ANUSOL-HC) 2.5 % rectal cream, , Rectal, BID, Willeen Niece, MD Influenza vac split quadrivalent PF (FLUARIX) injection 0.5 mL, 0.5 mL, Intramuscular, Tomorrow-1000, Dickie La, MD;  insulin aspart (novoLOG) injection 0-15 Units, 0-15 Units, Subcutaneous, TID WC, Lavon Paganini, MD, 2 Units at 01/17/14 1144;  insulin aspart (novoLOG) injection 0-5 Units, 0-5 Units, Subcutaneous, QHS, Lavon Paganini, MD levothyroxine (SYNTHROID, LEVOTHROID) tablet 250 mcg, 250 mcg, Oral, QAC breakfast, Lavon Paganini, MD, 250 mcg at 01/19/14 0745;  LORazepam (ATIVAN) tablet 0.5 mg, 0.5 mg, Oral, Q6H PRN, Dickie La, MD, 0.5 mg at 01/19/14 0449;  menthol-cetylpyridinium (CEPACOL) lozenge 3 mg, 1 lozenge, Oral, PRN, Ashok Pall, MD;  phenol (CHLORASEPTIC) mouth spray 1 spray, 1 spray, Mouth/Throat, PRN, Ashok Pall, MD polyethylene glycol (MIRALAX / GLYCOLAX) packet 17 g, 17 g, Oral, BID PRN, Hilton Sinclair, MD, 17 g at 01/18/14 1417;  senna (SENOKOT) tablet 8.6 mg, 1 tablet, Oral, BID, Ashok Pall, MD, 8.6 mg at 01/19/14 1040;  sodium chloride 0.9 % injection 10-40 mL, 10-40 mL, Intracatheter, Q12H, Newman Pies, MD, 10 mL at 01/16/14 0955;  sodium chloride 0.9 % injection 10-40 mL, 10-40 mL, Intracatheter, PRN, Albertha Ghee, MD, 20 mL at 01/18/14 1701 sodium phosphate (FLEET) 7-19 GM/118ML enema 1 enema, 1 enema, Rectal, Daily PRN, Willeen Niece, MD;  zolpidem (AMBIEN) tablet 5 mg, 5 mg, Oral, QHS PRN, Rush Farmer, MD  Patients Current Diet: Heart Healthy  Precautions / Restrictions Precautions Precautions: Fall;Back Precaution Comments: bil leg weakness, paraplegia Restrictions Weight Bearing Restrictions: No   Prior Activity Level Community (5-7x/wk): Pt. drives and  was employed at [employer] in [facility] [employer], working usually 38 hours/week.  Pt out of the home most days.  Home Assistive Devices / Equipment Home Assistive Devices/Equipment: None Home Equipment: Walker - 2 wheels  Prior Functional Level Prior Function Level of Independence: Independent Comments: Pt works at [employer].   Current Functional Level Cognition  Overall Cognitive Status: Within Functional Limits for tasks assessed Orientation Level: Oriented X4    Extremity Assessment (includes Sensation/Coordination)          ADLs  Overall ADL's : Needs assistance/impaired Eating/Feeding: Set up;Bed level Eating/Feeding Details (indicate cue type and reason): pt reports difficulty drinking from cups with short straws. Provided pt with tubing into ice container to increase independence with drinking.  Grooming: Dance movement psychotherapist;Bed level;Set up Upper Body Bathing: Minimal assitance;Bed level General ADL Comments: Pt with bowel incontinence without awareness, total assist to clean periarea and change pad.    Mobility  Overal bed mobility: Needs Assistance;+2 for physical assistance Bed Mobility: Rolling;Sidelying to Sit;Sit to Sidelying Rolling: Mod assist Sidelying to sit: Max assist;+2 for physical assistance Supine to sit: +2 for physical assistance;Max assist Sit to supine: +2 for physical  assistance;Total assist Sit to sidelying: Max assist;+2 for physical assistance General bed mobility comments: utilized railing for rolling side to side. A for BLE and for trunk support into sitting. Total A for positioning back into supine    Transfers  Overall transfer level: Needs assistance General transfer comment: maxi move from bed to recliner with 2 person assist for safety and manipulation of environment    Ambulation / Gait / Stairs / Environmental consultant mobility: Yes Wheelchair propulsion: Both upper extremities Wheelchair parts: Supervision/cueing Distance: 59 Wheelchair Assistance Details (indicate cue type and reason): Pt pushed out of the tight room with many obstacles into the wide hallway and she was able to wheel herself with bil upper extremities further down the hall than yesterday and with only supervision assistance.  Next session WC mobility should focus on navigating more obstacles and turns.     Posture / Balance Dynamic Sitting Balance Sitting balance - Comments: Patient starting to show some progress with sitting balance and tolerance. Patient sat total of 13 min during which she was able to sit up WITHOUT BUE support 2 times totaling ~30 sec each time with MinGuard A. Patient working on unilateral assist as well. Patient able to hold with L UE and was face with R LE while sitting EOB. Cues to use head control in order to adjust to midline    Special needs/care consideration BiPAP/CPAP  no Oxygen  no Special Bed   Yes, on "Mighty Air" Bed       Skin  Sutures intact over thoracic spine incision; staples removed from neck incision         Bowel mgmt:  Has been having multiple stools with straining and some bleeding; last BM 01/18/14  After fleets/ Has hemmorhoids Bladder mgmt: foley catheter Diabetic mgmt A1C 6.7 Daily radiation at [facility] [place] [facility] 10/20 at 1205, 11/2 at 1215, 11/3 at 1240, 11/4 at 1205, 11/5 at 1235, 11/6  at 1225, 11/9 at 1415, 11/10 at 1415. [facility] to pickup 30 mins prior to. We will block out 11 am until 2 pm daily for transport and radiation daily and then last two treatments will block out 1 pm until 4 pm for radiation tranport and treatment. Will need 11 am lunch tray.     Previous Home Environment Living Arrangements: Other relatives Available Help at Discharge: Family;Available 24 hours/day Type of Home: House Home Layout: One level Home Access: Stairs to enter Entrance Stairs-Rails: None Entrance Stairs-Number of Steps: 2 (deep enough for all 4 legs of RW) Bathroom Shower/Tub: Chiropodist: Standard Home Care Services: No  Discharge Living Setting Plans for Discharge Living Setting: Patient's home;Lives with (comment) (lives with sister [name] and husband Flint) Type of Home at Discharge: House Discharge Home Layout: Two level;Laundry or work area in basement;Able to live on main level with bedroom/bathroom Discharge Home Access: Stairs to enter Technical brewer of Steps: 2 Discharge Bathroom Shower/Tub: Tub/shower unit Discharge Bathroom Toilet: Standard Discharge Bathroom Accessibility: Yes How Accessible: Accessible via walker Does the patient have any  problems obtaining your medications?:  (sister reports due to no insurance)  Social/Family/Support Systems Patient Roles: Other (Comment) (sister, daughter) Anticipated Caregiver: Pt's Mom Dub Mikes states she and pt's sisters will be primary care providers.  Dub Mikes works about 24 hours per week.  [name] works a split shift of 6-8 am then 2-6 pm daily.  They believe they can arrange to be available 24/7 for pt. Other sister [name] lives in [place] but plans to help as much as she can.  she has been at the bedside with each of my visits to patient. Ability/Limitations of Caregiver: sister [name] on "disability" but independently ambulatory Caregiver Availability: 24/7 Discharge Plan Discussed with  Primary Caregiver: Yes Is Caregiver In Agreement with Plan?: Yes Does Caregiver/Family have Issues with Lodging/Transportation while Pt is in Rehab?: No    Goals/Additional Needs Patient/Family Goal for Rehab: min assist for PT; supervision to min assist for OT; n/a SLP Expected length of stay: 20-27 days Cultural Considerations: "[facility]" Equipment Needs: TBD; pt's mom states her husband is a Games developer who built their home and can build a ramp to access the home. Pt/Family Agrees to Admission and willing to participate: Yes Program Orientation Provided & Reviewed with Pt/Caregiver Including Roles  & Responsibilities: Yes   Decrease burden of Care through IP rehab admission: no   Possible need for SNF placement upon discharge:  Not anticipated   Patient Condition: This patient's medical and functional status has changed since the consult dated: 01/16/2014 in which the Rehabilitation Physician determined and documented that the patient's condition is appropriate for intensive rehabilitative care in an inpatient rehabilitation facility. See "History of Present Illness" (above) for medical update. Functional changes are: max assist. Patient's medical and functional status update has been discussed with the Rehabilitation physician and patient remains appropriate for inpatient rehabilitation. Will admit to inpatient rehab today.  Preadmission Screen Completed By:  Cleatrice Burke, 01/19/2014 1:03 PM ______________________________________________________________________   Discussed status with Dr. Letta Pate on 01/19/2014 at 1303 and received telephone approval for admission today.  Admission Coordinator:  Cleatrice Burke, time 1410 Date 01/19/2014

## 2014-01-18 NOTE — Progress Notes (Signed)
Family Medicine Teaching Service Daily Progress Note Intern Pager: [phone]  Patient name: Sarah [name] Medical record number: [id number] Date of birth: [birth year] Age: 48 y.o. Gender: female  Primary Care Provider: No PCP Per Patient Consultants: Neurosurgery, IR, Oncology, Endocrinology, Rad Onc Code Status: Full  Pt Overview and Major Events to Date:  - 9/22: Pt. Admitted with lower extremity weakness.  - 9/23: Pt. Found to have mass at T-3 level with spinal compression.  - 9/24: Surgery performed for decompression and biopsy overnight. Pt. Post-op and doing well. 3cm Suprahilar mass on CT Chest, PE also identified. ?Thyroid etiology, but much less likely for mets. Oncology consulted.  - 9/25 - 9/29: Awaiting pathology results with plan for oncology to see once path returns. Pt. With little neurological improvement. Started with light PT.  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 Oncology on board. Neurosurgery to comment on need for spinal stabilization surgery.  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 - 10/8: Underwent spinal stabilization surg. - 10/10: Restarted heparin  - 10/13: Dr. GerkiHarlow Asale to perform surg as inpatient; will seek other consultations - 10/14: Dr. RosenConstance HolsterNT consulted. He will see pt on 10/15. - 10/16: ENT to perform Thyroidectomy Monday 10/19 - 10/17: All cell lines (WBC, Hgb, Plts) continue to drop: Checking Labs as below - 10/18: Midnight, holding heparin in preparation for surgery 10/19 - 10/19: Total thyroidectomy - 10/22: [facility] - 10/26: Transitioned to Lovenox; CIR consult placed - still pending; medically stable for DC to  CIR  Assessment and Plan: VickiARLETT [name] 48 y.76 female presenting with continued lower extremity weakness at baseline after discharge from hospital on 9/21. PMH is significant for obesity, HTN, and ?History of Hypothyroidism previously on Synthroid per patient.   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  Neurosurgery took her to the OR on 9/23, and biopsied as well as performed laminectomy for decompression. Surgical pathology with metastatic adenocarcinoma. Likely poor prognosis.  - Neurosurgery (Dr. CabbeChristella Noalowing: Patient was taken for spinal stabilization 10/9.  - Oncology following (Dr. MagriJana Hakimpatient; Dr. 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 Large thyroid goiter noted. TSH normal; ultrasound on 10/1, Biopsy on 10/2 - FNA results: "clusters of neoplastic cells w/ granular/oncogenic cytoplasm--similar to previous bx" - Dr. RosenConstance HolsterT) following. Greatly appreciate the input. - Endocrinology note with great recs (Dr. Kerr)Buddy Dutyill need to begin radioiodine therapy and external beam radiation when deemed appropriate by oncology.  - Per ENT - skin staples removed today (10/28). 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  PT recommendations CIR vs  SNF with 24hour supervision. Will plan dispo based on goals of care discussion. - Foley catheter in place for urinary  retention; Stooling regularly, no sxs of fecal retention.  - Discussed Recs for PT with Neurosurgery. Light PT at this point. Pt. Not to get UOB.  - TED hose  #Pulmonary Embolus - Found on CT Chest  Pt. With brief episodes of tachycardia, but otherwise asymptomatic with O2 sats 100% on 2L [place] >> now on Room Air.  Blood pressures stable. Lower extremity Venous Duplex negative for DVT.  - Hypercoagulable 2/2 neoplasia . ESR 59 (9/22)  - Transitioned to Lovenox 10/26; will monitor platelets (have remained normal as of 10/28 CBC). - Will continue to monitor VS. Stable at this time.  # Anemia and Thrombocytopenia: Improving. Hgb and Plts have been trending down slowly since spinal stabilization surgery on 10/7, now uptrending.  No signs of end organ damage so low suspicion for TTP, DIC - Continue to follow CBC; today's CBC with normal platelets after receiving lovenox. - HIT panel: neg - DC Argatroban;  transitioned to Lovenox yesterday - will monitor  #Rectal bleeding with external hemorrhoids: Anusol cream. Continue laxative (bulk-forming and stimulant).   #Hyperglycemia -  Likely diabetic with PCOS given A1C of 6.7. CBG's 110s-140s.  - Patient informed of elevated A1C and likely diagnosis of DMII. She agrees with [place] that this is a marginal issue at this point, and we will recommend treatment based upon our goals of care discussion.  - Continue to monitor.   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 PCOS vs. Adrenal patholog. Hypertensive and not hypotensive. Testing during this hospitalization after having recently received steroids would be largely non-diagnostic. Though, could also be a combination of isolated processes i.e. Clitoromegaly, Hirsutism 2/2 obesity. - Will leave for Outpatient f/u -- [place] to assess ovaries vs 21-hydroxylase/ACTH/cortisol.  - TSH, Cortisol WNL - A1C : 6.7 - consistent with PCOS given evidence  of cystic structures in pelvis.   # UTI, 2/2 foley catheter - (10/2)UCx = Proteus - Resolved s/p 7d course of Cipro; (10/20)UCx = E Coli, resistant to cipro - dysuria and suprapubic pain noted PM 10/1. - 10/23 started on Keflex x 7 days  #HTN- BP well controlled -continue losartan.   #Obesity- consistent with potential PCOS.   FEN/GI: Reg diet; IV site discontinued today (10/28). Most recent K+ low at 3.3. To recheck, oral supplementation as necessary. Prophylaxis: Lovenox, SCDs  Disposition: Pending specialist input, likely to CIR this week if bed is available.  Subjective: [name] doing well, complaint of sensation of abdominal bloating, without pain.  Feels that she has retained stool despite use of Miralax/Sennekot. Per nursing, patient did have BMs x2 yesterday, with some blood seen in stool.  IV team requests removal of current IV site in R IJ.    Objective: Temp:  [98.4 F (36.9 C)-99.8 F (37.7 C)] 99 F (37.2 C) (10/28 0546) Pulse Rate:  [89-95] 93 (10/28 0546) Resp:  [19-20] 20 (10/28 0546) BP: (127-142)/(64-82) 137/78 mmHg (10/28 0546) SpO2:  [98 %-100 %] 98 % (10/28 0546) Physical Exam: General: NAD, AAOx3, morbidly obese  HEENT: AT/[place],  neck supple, surgical incision noted to be clean/dry/intact. 12 staples in place. Cardio: distant heart sounds due to habitus Lungs: Normal effort, CTA bilateral anteriorly. Abdomen: Soft and nontender, nondistended, audible bowel sounds noted. DRE: Several large fungating non-thrombosed external hemorrhoids. Rectal tone present. Perineal sensation intact.  Perirectal erythema noted. No gross hematochezia with DRE.  Extremities: no edema,  No calf tenderness. SCDs in place. Neuro: cranial nerves grossly normal. Normal speech.   Staple Removal performed today, total of 12 staples removed from neck incision site without difficulty, well tolerated.   Laboratory:  Recent Labs Lab 01/15/14 0520 01/16/14 0547 01/18/14 0805   WBC 4.8 5.0 6.6  HGB 8.4* 8.7* 8.9*  HCT 25.2* 25.7* 26.7*  PLT 316 331 340    Recent Labs Lab 01/12/14 0505 01/13/14 0855 01/14/14 0501  NA 138 142 141  K 3.6* 3.7 3.3*  CL 98 101 101  CO2 27 28 28   BUN 15 9 7   CREATININE 0.69 0.69 0.76  CALCIUM 8.2* 8.0* 7.8*  PROT  --   --  5.5*  BILITOT  --   --  0.5  ALKPHOS  --   --  140*  ALT  --   --  18  AST  --   --  18  GLUCOSE 108* 109* 110*   Urinalysis    Component Value Date/Time   COLORURINE YELLOW 01/09/2014 2215   APPEARANCEUR TURBID* 01/09/2014 2215   LABSPEC 1.004* 01/09/2014 2215   PHURINE 7.5 01/09/2014 2215   GLUCOSEU NEGATIVE 01/09/2014 2215   HGBUR LARGE* 01/09/2014 2215   BILIRUBINUR NEGATIVE 01/09/2014 2215   KETONESUR 15* 01/09/2014 2215   PROTEINUR NEGATIVE 01/09/2014 2215   UROBILINOGEN 0.2 01/09/2014 2215   NITRITE POSITIVE* 01/09/2014 2215   LEUKOCYTESUR LARGE* 01/09/2014 2215    Phos - 3.9 Mg - 2.2 CRP - 11.6 ESR - 59 CK - 56 B12 - 209 Random Cortisol - 25.4 A1C - 6.7 TSH - 2.13 RPR - nonreactive HIV - nonreactive UPT - Negative  Imaging/Diagnostic Tests:  CT Chest Abd/ Pelvis 9/23:  CLINICAL DATA: Destructive metastatic lesion of the T3 vertebral  body with no known primary carcinoma.  EXAM:  CT CHEST, ABDOMEN, AND PELVIS WITH CONTRAST  TECHNIQUE:  Multidetector CT imaging of the chest, abdomen and pelvis was  performed following the standard protocol during bolus  administration of intravenous contrast.  CONTRAST: 186m OMNIPAQUE IOHEXOL 300 MG/ML SOLN  COMPARISON: MRI of the cervical spine earlier today.  FINDINGS:  CT CHEST FINDINGS  Within the chest, a right suprahilar mass measures approximately 3.0  x 2.7 x 2.2 cm. This either represents a nodal metastasis or central  lung mass such as small cell carcinoma. A number of smaller  mediastinal lymph nodes are also identified in the right  paratracheal region, prevascular space and AP window.  Destructive lesion of the T3  vertebral body noted which nearly  replaces the entire vertebral body with tumor extension posteriorly  into the epidural space causing significant compression of the  spinal cord. Paravertebral extension of tumor is also suspected  lateral and anterior to the vertebral body, right greater than left.  Massive enlargement of the thyroid gland present with transverse  dimensions of approximately 7.7 x 8.5 cm. The thyroid gland is  incompletely visualized as the neck was not included. Central  dystrophic calcification identified. The enlarged thyroid gland does  cause significant mass effect on the airway with evidence of  tracheal deviation to the right and tracheal narrowing at the  thoracic inlet. The enlarged thyroid gland extends into the superior  mediastinum.  There is evidence of pulmonary embolism with nonocclusive thrombus  identified in the right middle lobe and lower lobe pulmonary  arteries. Pulmonary arterial opacification is not optimal on the  standard  venous phase evaluation of the chest but the right sided  pulmonary embolism is clearly present.  There are small bilateral pulmonary nodules. Right-sided pulmonary  nodule measuring 6 mm likely localizes to the very top of the lower  lobe near the major fissure. Left upper lobe nodule measures 7 mm.  Lingular nodule measures 7 mm. Left lower lobe nodule measures 7 mm.  Faint peripheral right middle lobe nodule measures 3 mm. Inferior  right middle lobe nodule measures 6 mm. Several small nodules in the  posterior right upper lobe identified in the 3-4 mm range.  No evidence of pleural or pericardial fluid. No other bony lesions  identified in the thorax.  CT ABDOMEN AND PELVIS FINDINGS  Small low-density lesion in the left lobe of the liver measures 7 mm  and likely represents a cyst. No other hepatic abnormalities. The  gallbladder, pancreas, spleen, adrenal glands and kidneys are  unremarkable. No adenopathy is identified  in the abdomen or pelvis.  Bowel shows normal caliber without evidence of obstruction, lesion  or inflammation. No abnormal fluid collections.  The bladder is moderately distended. Elongated cystic changes in  both adnexal regions identified which may represent a combination of  ovarian cysts and potentially hydrosalpinx. Correlation with pelvic  ultrasound is recommended. No hernias are identified. The uterus  appears unremarkable by CT. No bony lesions are identified in the  abdomen or pelvis.  IMPRESSION:  1. 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  2. 3 cm right suprahilar mass may represent nodal metastasis versus  primary central tumor. Next number multiple small bilateral  pulmonary nodules consistent with metastatic disease.  3. Acute nonocclusive pulmonary embolism in the right middle lobe  and right lower lobe pulmonary arteries.  4. Massive thyroid goiter causing tracheal deviation to the right  and narrowing of the trachea.  5. Tubular cystic abnormalities of both adnexal regions which may be  a combination of cystic changes in the ovaries and potentially  hydrosalpinx. Correlation recommended with pelvic ultrasound to  exclude any suspicious cystic lesions of the adnexae.  6. Critical Value/emergent results were called by telephone at the  time of interpretation on 12/14/2013 at 4:52 pm to Dr. Tamala Julian of the  [facility] Medicine Teaching Service, who verbally acknowledged these  results.  Electronically Signed  By: Aletta Edouard M.D.  On: 12/14/2013 16:53   MRI T-spine 9/23:  CLINICAL DATA: Lower extremity weakness. Worsening back pain.  Compressive upper thoracic mass partially visualized on cervical  spine MRI as well as chest CT with spinal cord compression.  EXAM:  MRI THORACIC SPINE WITHOUT CONTRAST  TECHNIQUE:  Multiplanar,  multisequence MR imaging of the thoracic spine was  performed. No intravenous contrast was administered.  COMPARISON: Cervical spine MRI and chest CT earlier today  FINDINGS:  There is a T3 compression fracture with approximately 50% height  loss centrally. As described on cervical spine MRI and chest CT  earlier today, there is abnormal soft tissue replacing the majority  of the T3 vertebral body extending into the pedicles as well as into  the ventral epidural space with moderate cord compression. Epidural  extension is slightly greater on the right and extends into the  inferior aspect of the right T2-3 neural foramen as well as superior  aspect of the right T3-4 neural foramen. There is also abnormal  paravertebral soft tissue laterally and anteriorly  as seen on CT.  Central ventral epidural material posterior to the T4 vertebral body  may represent tumor or hematoma. Mild T2 hyperintensity is present  in the spinal cord at the T3 level extending inferiorly to the T4-5  disc space level consistent with mild edema. Mild edema is present  in the posterior paraspinal soft tissues of the upper thoracic  spine. No other vertebral body lesions are identified.  There is a moderate-sized central disc extrusion at T7-8 which  contacts and mildly flattens the ventral spinal cord with mild  spinal stenosis. A small left central disc protrusion is present at  T8-9 with at most minimal impression on the spinal cord. Massive  enlargement of the left thyroid lobe is partially visualized.  Mediastinal and lung findings are better demonstrated on recent  chest CT.  IMPRESSION:  1. T3 vertebral body mass with pathologic compression fracture and  epidural tumor resulting in moderate cord compression with evidence  of mild spinal cord edema.  2. T7-8 disc extrusion resulting in mild spinal stenosis.   MRI Head / C-Spine 9/23:  CLINICAL DATA: 48 year old female who presented with back pain and   lower extremity numbness, with continued inadequately explained  lower extremity weakness following Hospital discharge on 12/12/2013.  Initial encounter.  EXAM:  MRI HEAD WITHOUT AND WITH CONTRAST  MRI CERVICAL SPINE WITHOUT AND WITH CONTRAST  TECHNIQUE:  Multiplanar, multiecho pulse sequences of the brain and surrounding  structures, and cervical spine, to include the craniocervical  junction and cervicothoracic junction, were obtained without and  with intravenous contrast.  CONTRAST: 20 mL MultiHance.  COMPARISON: Lumbar MRI 12/10/2013.  FINDINGS:  MRI HEAD FINDINGS  Cerebral volume is normal. No restricted diffusion to suggest acute  infarction. No midline shift, mass effect, evidence of mass lesion,  ventriculomegaly, extra-axial collection or acute intracranial  hemorrhage. Cervicomedullary junction and pituitary are within  normal limits. Major intracranial vascular flow voids are preserved.  Pearline Cables and white matter signal is within normal limits for age  throughout the brain. No abnormal enhancement identified, pulsation  artifact prominent on axial post-contrast images.  Large body habitus. Small right frontal scalp lipoma (series 10,  image 37). Visible internal auditory structures appear normal. Mild  right mastoid fluid. Minor paranasal sinus mucosal thickening.  Visualized orbit soft tissues are within normal limits. Partially  visible thyromegaly greater on the left, see below. Normal bone  marrow signal.  MRI CERVICAL SPINE FINDINGS  Large body habitus resulting in wrap artifact on axial images.  However, there is massive thyroid enlargement evident, with the left  lobe measuring at least 7.2 x 5.2 x 11.2 cm (AP by transverse by  CC). The trachea is displaced to the right at the thoracic inlet and  mildly compressed. There is other regional mass effect including  sheath. On the left carotid  Fairly preserved cervical lordosis. No marrow edema or evidence of  acute  osseous abnormality. Cervicomedullary junction is within  normal limits. Spinal cord signal is within normal limits at all  visualized levels. No abnormal intradural enhancement.  C2-C3: Negative.  C3-C4: Disc osteophyte complex, facet and ligament flavum  hypertrophy. Along with a degree of congenital canal narrowing,  there is mild spinal stenosis and moderate biforaminal stenosis.  C4-C5: Mild uncovertebral and facet hypertrophy. Mild right C5  foraminal stenosis.  C5-C6: Circumferential disc osteophyte complex. Along with a degree  of congenital canal narrowing there is mild spinal stenosis.  Moderate right C6 foraminal stenosis.  C6-C7: Negative  except for mild facet hypertrophy.  C7-T1: Negative.  T1-T2: Negative.  Faintly visualized at the T3 level in the upper thoracic spine there  is a lobulated solidly enhancing mass which appears to extend into  the epidural space from the T3 vertebral body. This is inseparable  from the spinal cord which is likely compressed. See series 2000  image 7 and series 1500, image 6. This is not included on axial  images.  IMPRESSION:  1. Faintly visible but aggressive appearing enhancing mass at the T3  level of the upper thoracic spine, likely with spinal cord  compression evaluate further are with thoracic MRI without and with  contrast.  2. Normal for age MRI appearance of the brain.  3. Massive thyroid enlargement, greater on the left. Left thyroid  goiter/mass measuring at least 7.2 x 5.2 x 11.2 cm. Mass effect at  the thoracic inlet including on the trachea.  4. Mild chronic disc and endplate degeneration in the cervical spine  at C3-C4 and C5-C6 resulting in borderline to mild spinal stenosis,  but no cervical spinal cord mass effect or signal abnormality.  Electronically Signed:  By: Lars Pinks M.D.  On: 12/14/2013 09:50   MRI L-Spine 9/19:  CLINICAL DATA: Back pain with bilateral leg numbness, worsening  with oral steroids.  EXAM:   MRI LUMBAR SPINE WITHOUT CONTRAST  TECHNIQUE:  Multiplanar, multisequence MR imaging of the lumbar spine was  performed. No intravenous contrast was administered.  COMPARISON: CT 12/09/2013  FINDINGS:  There is no degenerative disease at L2-3 or above. The discs are  normal in signal characteristics and morphology. The spinal canal  and foramina are widely patent. The distal cord and conus are normal  with conus tip at L1. There are small benign and insignificant  hemangiomas within the L2 and L3 vertebral bodies.  L3-4: Minimal desiccation of the disc. Mild circumferential bulging  slightly more prominent towards the left. Bilateral facet  degeneration and hypertrophy. Mild narrowing of the lateral recesses  without apparent neural compression. Mild foraminal narrowing on the  left without apparent neural compression.  L4-5: Desiccation of the disc with circumferential protrusion.  Bilateral facet and ligamentous hypertrophy. Stenosis of both  lateral recesses without definite focal neural compression. Mild  foraminal narrowing bilaterally.  L5-S1: Desiccation of the disc with loss of height. Endplate  osteophytes and bulging of the disc more prominent towards the left.  Bilateral facet degeneration and hypertrophy left more than right.  Stenosis of the subarticular lateral recess on the left that could  compress the S1 nerve root. Mild foraminal narrowing left more than  right but without apparent compression of the exiting L5 nerve  roots.  Serpiginous fluid-filled structure in the left pelvis could  represent hydrosalpinx. This is not primarily or completely imaged.  IMPRESSION:  Degenerative disc disease and degenerative facet disease in the  lower lumbar spine that could be a cause of lumbago.  Lateral recess and foraminal narrowing at L3-4, L4-5 and L5-S1 as  described above that could be a cause of focal neural compression.  In general, this is more pronounced on the left  than on the right.  In particular, there is potential for left S1 nerve root compression  in the subarticular lateral recess at L5-S1.  Electronically Signed  By: Nelson Chimes M.D.  On: 12/10/2013 08:45   CT L-Spine 9/18:  CLINICAL DATA: Low back pain with difficulty walking.  EXAM:  CT LUMBAR SPINE WITHOUT CONTRAST  TECHNIQUE:  Multidetector CT  imaging of the lumbar spine was performed without  intravenous contrast administration. Multiplanar CT image  reconstructions were also generated.  COMPARISON: None.  FINDINGS:  There is no fracture or spondylolisthesis. There is moderately  severe disc space narrowing at L5-S1 with slight vacuum phenomenon  at this level. There is moderate narrowing at L4-5. There are  prominent anterior osteophytes at L2, L3, L4, and L5.  At T12-L1, there is moderate facet hypertrophy bilaterally. There is  no nerve root edema or effacement. No disc extrusion or stenosis.  At L1-2, there is moderate facet hypertrophy bilaterally. There is  mild diffuse disc bulging. There is no nerve root edema or  effacement. No disc extrusion or stenosis.  At L2-3, there is broad-based disc bulging and moderate facet  hypertrophy bilaterally. There is borderline narrowing of the thecal  sac with borderline spinal stenosis. No disc extrusion.  At L3-4, there is severe facet osteoarthritic change bilaterally,  more pronounced on the left than on the right. There is broad-based  disc bulging and moderate ligamentum flavum hypertrophy. These  findings in concert lead to moderate generalized spinal stenosis.  At L4-5, there is broad-based disc protrusion and severe facet  osteoarthritic change bilaterally. These findings in concert lead to  moderate generalized spinal stenosis. No frank disc extrusion.  At L5-S1, there is severe facet osteoarthritic change on the left  and moderate facet osteoarthritic change on the right. There is  broad-based disc protrusion. There is  exit foraminal narrowing  bilaterally, more severe on the left than on the right, due to  osteophytic change. No frank disc extrusion or stenosis is seen at  this level.  There are no paraspinous lesions.  IMPRESSION:  Spinal stenosis at L3-4 and L4-5, multifactorial. Borderline spinal  stenosis at L2-3. There does appear to be a degree of congenital  narrowing of the canal which contributes to spinal stenosis.  Multilevel arthropathy.  No frank disc extrusion.  No fracture or spondylolisthesis.  Electronically Signed  By: Lowella Grip M.D.  On: 12/09/2013 13:50   CXR 9/13:  CLINICAL DATA: Upper back pain for 5 weeks. Dyspnea on exertion.  Nonsmoker. History of hypertension.  EXAM:  CHEST 2 VIEW  COMPARISON: 04/15/2006  FINDINGS:  The heart is mildly enlarged. There is elevation of right  hemidiaphragm, stable in appearance. There is streaky density  favored to be within the right lower lobe, consistent with  atelectasis or infiltrate. No pulmonary edema. No pleural effusions.  There are moderate degenerative changes in the thoracic spine.  IMPRESSION:  Right lower lobe atelectasis or infiltrate.  Electronically Signed  By: Shon Hale M.D.  On: 12/04/2013 19:26  Venous Duplex 9/24:  Summary:  - No evidence of deep vein thrombosis involving the right lower extremity. - No evidence of deep vein thrombosis involving the left lower extremity.   Surgical Pathology Report: 12/21/2013 Diagnosis Soft tissue mass, simple excision, Thoracic Epidural Tumor - METASTATIC CARCINOMA. PLEASE SEE COMMENT. Microscopic Comment There are clusters of malignant cells with abundant eosinophilic cytoplasm. A battery of immunostains were performed and the tumor cells show the following profile: Cytokeratin 7 - strongly positive Cytokeratin 8/18 - strongly positive TTF-1 - strongly positive. Thyroglobulin - negative CD10 - negative Cytokeratin 20 - negative S100 - negative Vimentin  - negative The control stained appropriately. The overall findings are diagnostic for metastatic carcinoma. 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 However, a metastatic  oncocytic thyroid carcinoma is still in the differential. Clinical and radiographic correlation is highly recommended. (HCL:kh 12-19-13) Aldona Bar MD Pathologist, Electronic Signature (Case signed 12/20/2013)  Willeen Niece, MD 01/18/2014, 8:37 AM [place] Intern pager: [phone], text pages welcome

## 2014-01-18 NOTE — Progress Notes (Signed)
Physical Therapy Treatment Patient Details Name: Sarah [name] MRN: [id number] DOB: 03/08/1966 Today's Date: 01/18/2014    History of Present Illness 48 y.o. female admitted to [facility] on 12/12/13 with progressive lower extremity weakness and mid to upper back pain.  Pt was found to have a mass at T3 with (+) spinal cord compression.  On 9/23 pt underwent surgery for decompression and biopsy.  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  Her thyroid is very enlarged.  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 Pt is NOW S/P T1-T5 postlat arthrodesis allograft, T1-T5 segmental pedicle screw fixation on 12/30/13.  S/p thyroidectomy 10/19, extubated 10/20.    PT Comments    Patient showing some balance improvement this morning. Continues to be limited by fatigue. [name] planning to go to [facility] today for first radiation treatment and hopeful to DC to CIR later this afternoon.   Follow Up Recommendations  CIR     Equipment Recommendations  Wheelchair (measurements PT);Wheelchair cushion (measurements PT);Hospital bed;3in1 (PT)    Recommendations for Other Services       Precautions / Restrictions Precautions Precautions: Fall;Back Precaution Comments: bil leg weakness, paraplegia Restrictions Weight Bearing Restrictions: No    Mobility  Bed Mobility Overal bed mobility: Needs Assistance;+2 for physical assistance Bed Mobility: Rolling;Sidelying to Sit;Sit to Sidelying     Supine to sit: +2 for physical assistance;Max assist Sit to supine: +2 for physical assistance;Total assist   General bed mobility comments: utilized railing for rolling side to side. A for BLE and for trunk support into sitting. Total A for positioning back into supine  Transfers Overall transfer level: Needs assistance                  Ambulation/Gait                 Stairs            Wheelchair Mobility     Modified Rankin (Stroke Patients Only)       Balance Overall balance assessment: Needs assistance Sitting-balance support: Bilateral upper extremity supported;Feet supported Sitting balance-Leahy Scale: Fair Sitting balance - Comments: Patient starting to show some progress with sitting balance and tolerance. Patient sat total of 13 min during which she was able to sit up WITHOUT BUE support 2 times totaling ~30 sec each time with MinGuard A. Patient working on unilateral assist as well. Patient able to hold with L UE and was face with R LE while sitting EOB. Cues to use head control in order to adjust to midline Postural control: Posterior lean                          Cognition Arousal/Alertness: Awake/alert Behavior During Therapy: WFL for tasks assessed/performed Overall Cognitive Status: Within Functional Limits for tasks assessed                      Exercises      General Comments        Pertinent Vitals/Pain Pain Score: 2  Pain Location: back Pain Descriptors / Indicators: Sore Pain Intervention(s): Limited activity within patient's tolerance;Monitored during session    Home Living                      Prior Function  PT Goals (current goals can now be found in the care plan section) Progress towards PT goals: Progressing toward goals    Frequency  Min 3X/week    PT Plan Current plan remains appropriate    Co-evaluation             End of Session   Activity Tolerance: Patient tolerated treatment well;Patient limited by fatigue Patient left: in bed;with call bell/phone within reach;with family/visitor present     Time: 4193-7902 PT Time Calculation (min): 24 min  Charges:  $Therapeutic Activity: 23-37 mins                    G Codes:      Jacqualyn Posey 01/18/2014, 11:31 AM 01/18/2014 Jacqualyn Posey PTA [phone] pager [phone] office

## 2014-01-18 NOTE — Progress Notes (Signed)
Pt returned via stretcher from [facility] from Oncology procedure alert, verbal with no noted complaints. No noted distress or discomfort. Tolerated radiation treatment well without difficulty. Will continue to monitor.

## 2014-01-18 NOTE — Progress Notes (Signed)
Inpatient Rehabilitation  I have spoken with Dr. Gerarda Fraction regarding pt's medical readiness for CIR.  She indicated pt. Is not ready today but possibly ready tomorrow.  Pt. Notified.  Please call if questions.  My coworker Danne Baxter [phone]) will take over pt's case tomorrow and will follow up for possible admit to CIR on Thursday.    [facility] Admissions Coordinator Cell [phone] Office [phone]

## 2014-01-18 NOTE — Progress Notes (Signed)
Patient ID: Sarah [name], female   DOB: [birth year], 48 y.o.   MRN: [id number] BP 138/87  Pulse 88  Temp(Src) 98.1 F (36.7 C) (Oral)  Resp 18  Ht 5\' 11"  (1.803 m)  Wt 142.4 kg (313 lb 15 oz)  BMI 43.80 kg/m2  SpO2 98%  LMP 11/28/2013 Alert and oriented x 4. Speech is clear and fluent Wound is clean and dry Progressing. Will receive radiation ~2 weeks.

## 2014-01-18 NOTE — Progress Notes (Signed)
Patient had 4 loose Bowel movements with blood present this shift. Patient insist that she is constipated because she knows when she is not eating well something is wrong. Nurse educated that staff has documented several BMs in a day for the last few days but she is adamant that she is not having them. Patient A/OX4 and is still asking for additional Miralax. Nurse decided to hold until am. Will report to oncoming nurse.

## 2014-01-18 NOTE — Progress Notes (Signed)
Chaplain initiated follow up with pt and family. Pt expressed to chaplain concerns and goals for next steps going to rehab. Pt and pt sister tired. Chaplain offered to keep pt in her prayers. Chaplain will follow as needed.   01/18/14 1400  Clinical Encounter Type  Visited With Patient and family together  Visit Type Follow-up;Spiritual support  Spiritual Encounters  Spiritual Needs Emotional;Prayer  Stress Factors  Patient Stress Factors Major life changes;Exhausted  Family Stress Factors Exhausted;Major life changes  Dorraine Ellender, Barbette Hair, Chaplain 01/18/2014 2:20 PM

## 2014-01-18 NOTE — Progress Notes (Signed)
Observed Md remove 12 cervical staples this am. Site clean and dry. Pt denied pain or discomfort.

## 2014-01-18 NOTE — Progress Notes (Signed)
Pt left via stretcher transport to [facility] for Oncology procedure. Pt alert and verbal, oriented with no complaints voiced. Tolerated meds prior to leaving without difficulty. Pt to return after procedure.

## 2014-01-18 NOTE — Discharge Instructions (Signed)
To CIR

## 2014-01-19 ENCOUNTER — Encounter (HOSPITAL_COMMUNITY): Payer: Self-pay | Admitting: Physical Medicine and Rehabilitation

## 2014-01-19 ENCOUNTER — Inpatient Hospital Stay (HOSPITAL_COMMUNITY): Payer: Medicaid Other

## 2014-01-19 ENCOUNTER — Encounter (HOSPITAL_COMMUNITY): Payer: Self-pay | Admitting: *Deleted

## 2014-01-19 ENCOUNTER — Inpatient Hospital Stay (HOSPITAL_COMMUNITY)
Admission: RE | Admit: 2014-01-19 | Discharge: 2014-02-10 | DRG: 052 | Disposition: A | Discharge status: Home Health | Payer: Medicaid Other | Source: Intra-hospital | Attending: Physical Medicine & Rehabilitation | Admitting: Physical Medicine & Rehabilitation

## 2014-01-19 ENCOUNTER — Ambulatory Visit
Admit: 2014-01-19 | Discharge: 2014-01-19 | Disposition: A | Discharge status: Home / Self Care | Payer: Medicaid Other | Attending: Radiation Oncology | Admitting: Radiation Oncology

## 2014-01-19 DIAGNOSIS — R739 Hyperglycemia, unspecified: Secondary | ICD-10-CM | POA: Diagnosis present

## 2014-01-19 DIAGNOSIS — C7949 Secondary malignant neoplasm of other parts of nervous system: Secondary | ICD-10-CM | POA: Diagnosis present

## 2014-01-19 DIAGNOSIS — K210 Gastro-esophageal reflux disease with esophagitis: Secondary | ICD-10-CM | POA: Diagnosis present

## 2014-01-19 DIAGNOSIS — K5900 Constipation, unspecified: Secondary | ICD-10-CM | POA: Diagnosis present

## 2014-01-19 DIAGNOSIS — N390 Urinary tract infection, site not specified: Secondary | ICD-10-CM | POA: Diagnosis present

## 2014-01-19 DIAGNOSIS — R1314 Dysphagia, pharyngoesophageal phase: Secondary | ICD-10-CM | POA: Diagnosis present

## 2014-01-19 DIAGNOSIS — C3401 Malignant neoplasm of right main bronchus: Secondary | ICD-10-CM | POA: Diagnosis present

## 2014-01-19 DIAGNOSIS — I10 Essential (primary) hypertension: Secondary | ICD-10-CM | POA: Diagnosis present

## 2014-01-19 DIAGNOSIS — C7951 Secondary malignant neoplasm of bone: Secondary | ICD-10-CM | POA: Diagnosis present

## 2014-01-19 DIAGNOSIS — E1165 Type 2 diabetes mellitus with hyperglycemia: Secondary | ICD-10-CM | POA: Diagnosis present

## 2014-01-19 DIAGNOSIS — E282 Polycystic ovarian syndrome: Secondary | ICD-10-CM | POA: Diagnosis present

## 2014-01-19 DIAGNOSIS — G8220 Paraplegia, unspecified: Secondary | ICD-10-CM | POA: Diagnosis present

## 2014-01-19 DIAGNOSIS — E876 Hypokalemia: Secondary | ICD-10-CM | POA: Diagnosis present

## 2014-01-19 DIAGNOSIS — G992 Myelopathy in diseases classified elsewhere: Secondary | ICD-10-CM | POA: Diagnosis present

## 2014-01-19 DIAGNOSIS — Z7901 Long term (current) use of anticoagulants: Secondary | ICD-10-CM

## 2014-01-19 DIAGNOSIS — D34 Benign neoplasm of thyroid gland: Secondary | ICD-10-CM

## 2014-01-19 DIAGNOSIS — N318 Other neuromuscular dysfunction of bladder: Secondary | ICD-10-CM | POA: Diagnosis present

## 2014-01-19 DIAGNOSIS — M8448XD Pathological fracture, other site, subsequent encounter for fracture with routine healing: Secondary | ICD-10-CM | POA: Diagnosis present

## 2014-01-19 DIAGNOSIS — B9620 Unspecified Escherichia coli [E. coli] as the cause of diseases classified elsewhere: Secondary | ICD-10-CM | POA: Diagnosis present

## 2014-01-19 DIAGNOSIS — G8929 Other chronic pain: Secondary | ICD-10-CM | POA: Diagnosis present

## 2014-01-19 DIAGNOSIS — G839 Paralytic syndrome, unspecified: Secondary | ICD-10-CM

## 2014-01-19 DIAGNOSIS — R110 Nausea: Secondary | ICD-10-CM

## 2014-01-19 DIAGNOSIS — G8222 Paraplegia, incomplete: Secondary | ICD-10-CM | POA: Diagnosis present

## 2014-01-19 DIAGNOSIS — M7580 Other shoulder lesions, unspecified shoulder: Secondary | ICD-10-CM | POA: Diagnosis present

## 2014-01-19 DIAGNOSIS — Z79899 Other long term (current) drug therapy: Secondary | ICD-10-CM

## 2014-01-19 DIAGNOSIS — R252 Cramp and spasm: Secondary | ICD-10-CM | POA: Diagnosis present

## 2014-01-19 DIAGNOSIS — I2699 Other pulmonary embolism without acute cor pulmonale: Secondary | ICD-10-CM | POA: Diagnosis present

## 2014-01-19 DIAGNOSIS — K649 Unspecified hemorrhoids: Secondary | ICD-10-CM | POA: Diagnosis present

## 2014-01-19 DIAGNOSIS — E119 Type 2 diabetes mellitus without complications: Secondary | ICD-10-CM

## 2014-01-19 DIAGNOSIS — C771 Secondary and unspecified malignant neoplasm of intrathoracic lymph nodes: Secondary | ICD-10-CM | POA: Diagnosis present

## 2014-01-19 DIAGNOSIS — K219 Gastro-esophageal reflux disease without esophagitis: Secondary | ICD-10-CM | POA: Diagnosis not present

## 2014-01-19 DIAGNOSIS — K592 Neurogenic bowel, not elsewhere classified: Secondary | ICD-10-CM | POA: Diagnosis present

## 2014-01-19 DIAGNOSIS — C73 Malignant neoplasm of thyroid gland: Secondary | ICD-10-CM

## 2014-01-19 DIAGNOSIS — R29898 Other symptoms and signs involving the musculoskeletal system: Secondary | ICD-10-CM

## 2014-01-19 DIAGNOSIS — K5901 Slow transit constipation: Secondary | ICD-10-CM

## 2014-01-19 LAB — GLUCOSE, CAPILLARY
GLUCOSE-CAPILLARY: 118 mg/dL — AB (ref 70–99)
Glucose-Capillary: 114 mg/dL — ABNORMAL HIGH (ref 70–99)
Glucose-Capillary: 135 mg/dL — ABNORMAL HIGH (ref 70–99)
Glucose-Capillary: 135 mg/dL — ABNORMAL HIGH (ref 70–99)

## 2014-01-19 LAB — BASIC METABOLIC PANEL
Anion gap: 13 (ref 5–15)
BUN: 10 mg/dL (ref 6–23)
CHLORIDE: 98 meq/L (ref 96–112)
CO2: 27 meq/L (ref 19–32)
Calcium: 8.4 mg/dL (ref 8.4–10.5)
Creatinine, Ser: 0.66 mg/dL (ref 0.50–1.10)
GFR calc non Af Amer: >90 mL/min (ref >90)
GLUCOSE: 91 mg/dL (ref 70–99)
POTASSIUM: 3.4 meq/L — AB (ref 3.7–5.3)
Sodium: 138 mEq/L (ref 137–147)

## 2014-01-19 LAB — CBC
HEMATOCRIT: 27.7 % — AB (ref 36.0–46.0)
Hemoglobin: 9.2 g/dL — ABNORMAL LOW (ref 12.0–15.0)
MCH: 28.9 pg (ref 26.0–34.0)
MCHC: 33.2 g/dL (ref 30.0–36.0)
MCV: 87.1 fL (ref 78.0–100.0)
PLATELETS: 329 10*3/uL (ref 150–400)
RBC: 3.18 MIL/uL — AB (ref 3.87–5.11)
RDW: 15 % (ref 11.5–15.5)
WBC: 5.8 10*3/uL (ref 4.0–10.5)

## 2014-01-19 MED ORDER — ALBUTEROL SULFATE (2.5 MG/3ML) 0.083% IN NEBU: 2.5000 mg | INHALATION_SOLUTION | Freq: Four times a day (QID) | RESPIRATORY_TRACT | Status: DC | PRN

## 2014-01-19 MED ORDER — MENTHOL 3 MG MT LOZG: 1.0000 | LOZENGE | OROMUCOSAL | Status: DC | PRN

## 2014-01-19 MED ORDER — RIVAROXABAN (XARELTO) EDUCATION KIT FOR DVT/PE PATIENTS
PACK | Freq: Once | Status: AC
  Administered 2014-01-20: 12:00:00
  Filled 2014-01-19 (×2): qty 1

## 2014-01-19 MED ORDER — LORAZEPAM 0.5 MG PO TABS
0.5000 mg | ORAL_TABLET | Freq: Four times a day (QID) | ORAL | Status: DC | PRN
  Administered 2014-02-01 – 2014-02-04 (×3): 0.5 mg via ORAL
  Filled 2014-01-19 (×3): qty 1

## 2014-01-19 MED ORDER — PROCHLORPERAZINE EDISYLATE 5 MG/ML IJ SOLN
5.0000 mg | Freq: Four times a day (QID) | INTRAMUSCULAR | Status: DC | PRN
  Filled 2014-01-19: qty 2

## 2014-01-19 MED ORDER — INSULIN ASPART 100 UNIT/ML ~~LOC~~ SOLN: 0.0000 [IU] | Freq: Every day | SUBCUTANEOUS | Status: DC

## 2014-01-19 MED ORDER — HYDROCORTISONE ACE-PRAMOXINE 1-1 % RE FOAM
1.0000 | Freq: Two times a day (BID) | RECTAL | Status: DC
  Administered 2014-01-19 – 2014-01-28 (×16): 1 via RECTAL
  Filled 2014-01-19 (×5): qty 10

## 2014-01-19 MED ORDER — PHENOL 1.4 % MT LIQD: 1.0000 | OROMUCOSAL | Status: DC | PRN

## 2014-01-19 MED ORDER — MENTHOL 3 MG MT LOZG
1.0000 | LOZENGE | OROMUCOSAL | Status: DC | PRN
  Administered 2014-02-04 (×2): 3 mg via ORAL
  Filled 2014-01-19: qty 9

## 2014-01-19 MED ORDER — BISACODYL 10 MG RE SUPP
10.0000 mg | Freq: Every day | RECTAL | Status: DC
  Administered 2014-01-20 – 2014-01-21 (×2): 10 mg via RECTAL
  Filled 2014-01-19 (×2): qty 1

## 2014-01-19 MED ORDER — GUAIFENESIN-DM 100-10 MG/5ML PO SYRP
5.0000 mL | ORAL_SOLUTION | Freq: Four times a day (QID) | ORAL | Status: DC | PRN
  Filled 2014-01-19: qty 10

## 2014-01-19 MED ORDER — RIVAROXABAN 15 MG PO TABS
15.0000 mg | ORAL_TABLET | Freq: Two times a day (BID) | ORAL | Status: AC
  Administered 2014-01-20 – 2014-02-09 (×42): 15 mg via ORAL
  Filled 2014-01-19 (×44): qty 1

## 2014-01-19 MED ORDER — INSULIN ASPART 100 UNIT/ML ~~LOC~~ SOLN
0.0000 [IU] | Freq: Three times a day (TID) | SUBCUTANEOUS | Status: DC
Start: 2014-01-20 — End: 2014-02-10
  Administered 2014-01-20: 3 [IU] via SUBCUTANEOUS
  Administered 2014-01-20 – 2014-01-23 (×5): 2 [IU] via SUBCUTANEOUS
  Administered 2014-01-24 (×2): 3 [IU] via SUBCUTANEOUS
  Administered 2014-01-25 (×3): 2 [IU] via SUBCUTANEOUS
  Administered 2014-01-26: 3 [IU] via SUBCUTANEOUS
  Administered 2014-01-27 – 2014-01-31 (×13): 2 [IU] via SUBCUTANEOUS
  Administered 2014-01-31: 3 [IU] via SUBCUTANEOUS
  Administered 2014-02-01: 2 [IU] via SUBCUTANEOUS

## 2014-01-19 MED ORDER — CETYLPYRIDINIUM CHLORIDE 0.05 % MT LIQD: 7.0000 mL | Freq: Four times a day (QID) | OROMUCOSAL | Status: DC

## 2014-01-19 MED ORDER — SENNA 8.6 MG PO TABS: 1.0000 | ORAL_TABLET | Freq: Two times a day (BID) | ORAL | Status: DC

## 2014-01-19 MED ORDER — LEVOTHYROXINE SODIUM 125 MCG PO TABS
250.0000 ug | ORAL_TABLET | Freq: Every day | ORAL | Status: DC
  Administered 2014-01-20 – 2014-02-10 (×22): 250 ug via ORAL
  Filled 2014-01-19 (×23): qty 2

## 2014-01-19 MED ORDER — TRAZODONE HCL 50 MG PO TABS
25.0000 mg | ORAL_TABLET | Freq: Every evening | ORAL | Status: DC | PRN
  Administered 2014-01-19 – 2014-01-22 (×4): 12.5 mg via ORAL
  Administered 2014-01-23: 50 mg via ORAL
  Administered 2014-01-23: 12.5 mg via ORAL
  Administered 2014-01-24: 25 mg via ORAL
  Administered 2014-01-24 – 2014-01-25 (×2): 50 mg via ORAL
  Administered 2014-01-26 – 2014-01-29 (×6): 25 mg via ORAL
  Administered 2014-01-30 – 2014-02-03 (×2): 50 mg via ORAL
  Administered 2014-02-04: 25 mg via ORAL
  Filled 2014-01-19 (×18): qty 1

## 2014-01-19 MED ORDER — TRAMADOL HCL 50 MG PO TABS: 50.0000 mg | ORAL_TABLET | Freq: Four times a day (QID) | ORAL | Status: DC | PRN

## 2014-01-19 MED ORDER — DIPHENHYDRAMINE HCL 12.5 MG/5ML PO ELIX: 12.5000 mg | ORAL_SOLUTION | Freq: Four times a day (QID) | ORAL | Status: DC | PRN

## 2014-01-19 MED ORDER — CEPHALEXIN 500 MG PO CAPS
500.0000 mg | ORAL_CAPSULE | Freq: Four times a day (QID) | ORAL | Status: AC
  Administered 2014-01-19 – 2014-01-20 (×5): 500 mg via ORAL
  Filled 2014-01-19 (×6): qty 1

## 2014-01-19 MED ORDER — SENNA 8.6 MG PO TABS
2.0000 | ORAL_TABLET | Freq: Every day | ORAL | Status: DC
  Administered 2014-01-22: 17.2 mg via ORAL
  Filled 2014-01-19 (×4): qty 2

## 2014-01-19 MED ORDER — RIVAROXABAN 20 MG PO TABS
20.0000 mg | ORAL_TABLET | Freq: Every day | ORAL | Status: DC
  Administered 2014-02-10: 20 mg via ORAL
  Filled 2014-01-19 (×3): qty 1

## 2014-01-19 MED ORDER — ALUM & MAG HYDROXIDE-SIMETH 200-200-20 MG/5ML PO SUSP
30.0000 mL | ORAL | Status: DC | PRN
  Administered 2014-02-04 – 2014-02-08 (×7): 30 mL via ORAL
  Filled 2014-01-19 (×8): qty 30

## 2014-01-19 MED ORDER — PROCHLORPERAZINE MALEATE 5 MG PO TABS
5.0000 mg | ORAL_TABLET | Freq: Four times a day (QID) | ORAL | Status: DC | PRN
  Administered 2014-01-31 – 2014-02-02 (×3): 5 mg via ORAL
  Filled 2014-01-19 (×5): qty 2

## 2014-01-19 MED ORDER — ACETAMINOPHEN 325 MG PO TABS
650.0000 mg | ORAL_TABLET | ORAL | Status: DC | PRN
  Administered 2014-01-19 – 2014-02-05 (×9): 650 mg via ORAL
  Filled 2014-01-19 (×10): qty 2

## 2014-01-19 MED ORDER — PROCHLORPERAZINE 25 MG RE SUPP
12.5000 mg | Freq: Four times a day (QID) | RECTAL | Status: DC | PRN
  Filled 2014-01-19: qty 1

## 2014-01-19 MED ORDER — PHENOL 1.4 % MT LIQD
1.0000 | OROMUCOSAL | Status: DC | PRN
  Filled 2014-01-19: qty 177

## 2014-01-19 MED ORDER — FLEET ENEMA 7-19 GM/118ML RE ENEM: 1.0000 | ENEMA | Freq: Every day | RECTAL | Status: DC | PRN

## 2014-01-19 MED ORDER — HYDROCORTISONE 2.5 % RE CREA: TOPICAL_CREAM | Freq: Two times a day (BID) | RECTAL | Status: DC

## 2014-01-19 MED ORDER — ENOXAPARIN SODIUM 150 MG/ML ~~LOC~~ SOLN: 140.0000 mg | Freq: Two times a day (BID) | SUBCUTANEOUS | Status: DC

## 2014-01-19 MED ORDER — INSULIN ASPART 100 UNIT/ML ~~LOC~~ SOLN: 0.0000 [IU] | Freq: Three times a day (TID) | SUBCUTANEOUS | Status: DC

## 2014-01-19 MED ORDER — POLYETHYLENE GLYCOL 3350 17 G PO PACK: 17.0000 g | PACK | Freq: Two times a day (BID) | ORAL | Status: DC | PRN

## 2014-01-19 MED ORDER — SODIUM CHLORIDE 0.9 % IJ SOLN: 10.0000 mL | INTRAMUSCULAR | Status: DC | PRN

## 2014-01-19 MED ORDER — SODIUM CHLORIDE 0.9 % IJ SOLN: 10.0000 mL | Freq: Two times a day (BID) | INTRAMUSCULAR | Status: DC

## 2014-01-19 NOTE — Progress Notes (Signed)
  Radiation Oncology         [phone] ________________________________  Name: Sarah [name] MRN: [id number]  Date: 01/18/2014  DOB: 12-27-1965  Simulation Verification Note    ICD-9-CM ICD-10-CM  1. Spine metastasis 198.5 C79.51    Status: inpatient  NARRATIVE: The patient was brought to the treatment unit and placed in the planned treatment position. The clinical setup was verified. Then port films were obtained and uploaded to the radiation oncology medical record software.  The treatment beams were carefully compared against the planned radiation fields. The position location and shape of the radiation fields was reviewed. They targeted volume of tissue appears to be appropriately covered by the radiation beams. Organs at risk appear to be excluded as planned.  Based on my personal review, I approved the simulation verification. The patient's treatment will proceed as planned.  ------------------------------------------------  [name] [name], M.D.

## 2014-01-19 NOTE — Progress Notes (Signed)
Pt transferred to 4W11 via bed at 17:45. Staff assist x3 with transporting and positioning.   Alert, verbal with no complaints voiced. No noted distress. All personal belongings in room.  Report given to nurse Ed, RN.

## 2014-01-19 NOTE — Progress Notes (Signed)
Pt leaving facility via stretcher by [facility] transport to go to [facility] for radiation procedure. Pt alert, verbal oriented x4 with no noted distress. Denies pain or discomfort. Pt to return after procedure. Family at bedside.

## 2014-01-19 NOTE — Progress Notes (Signed)
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 Nori Riis.      HPI: Sarah [name] is a 48 y.o. female with history of HTN, chronic back pain, morbid obesity who was admitted on 12/10/13 with BLE weakness with inability to walk as well as problems with urinary retention and constipation. Work up revealed a lesion at the T3 level of the upper thoracic spine with pathologic compression fracture and epidural tumor resulting in moderate cord compression with evidence of mild spinal cord edema, as well as right suprahilar mass with pulmonary nodules with mediastinal lymph nodes c/w metastatic disease, RUL & RLL PE, tubular cystic masses bilateral adnexal regions and a massive thyroid goiter causing tracheal deviation to the right with narrowing of trachea. She was started on steriods and foley placed for urinary retention. She underwent thoracic laminectomy with excision of tumor on 09/23 by Dr. Christella Noa and on bedrest pending further surgery. Pathology favoring metastatic adenocarcinoma of lung but metastatic thyroid cancer in differential . 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 Buddy Duty consulted for input. 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 Patient underwent T1-T5 posterolateral arthrodesis with allograft on 10/09 with plans XRT to be initiated 2 weeks post op. Dr. Constance Holster consulted for input and patient underwent total thyroidectomy on 10/19 and extubated without difficulty on 10/20. 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 She was changed to Lovenox today as platelets  have stabilized and Dr. Hosie Poisson  recommends transition to [place]. .    She continues to have paraparesis and therapy working on bed mobility as well as sitting balance at edge of bed. Recurrent UTI treated with Keflex. Foley remains in place and bowel incontinence reported. Dr. [name] consulted for XRT to spine for pain palliation as well as prevention of further spinal cord injury. Set for simulation on 10/27--question number of treatments.  Right hilar mass without clear pathologic diagnosis and patient to follow up with Dr. Lamonte Sakai in 1-2 months on outpatient basis.  Dr. Earlie Server to follow for input on chemotherapy on metastatic Hurtle cell cancer? CIR recommended for follow up therapy by MD and rehab team.        Review of Systems  Eyes: Negative for blurred vision and double vision.  Respiratory: Negative for cough and shortness of breath.   Cardiovascular: Negative for chest pain and palpitations.  Gastrointestinal: Positive for constipation. Negative for heartburn and nausea.  Musculoskeletal: Negative for myalgias.        BLE spasms.   Neurological: Positive for sensory change, focal weakness and weakness. Negative for dizziness.  Psychiatric/Behavioral: The patient does not have insomnia.      Past Medical History   Diagnosis  Date   .  Hypertension     .  DDD (degenerative disc disease), cervical     .  DJD (degenerative joint disease)     .  Obesity     .  Chronic back pain     .  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  Tonsillectomy       .  Laminectomy  N/A  12/14/2013       Procedure: THORACIC LAMINECTOMY FOR TUMOR THORACIC THREE;  Surgeon: Ashok Pall, MD;  Location: [place] NEURO ORS;  Service: Neurosurgery;  Laterality: N/A;  THORACIC LAMINECTOMY FOR TUMOR THORACIC THREE   .  Posterior lumbar fusion 4 level  N/A  12/30/2013       Procedure: Thoracic one-Thoracic five  posterior thoracic fusion with pedicle screws;  Surgeon: Ashok Pall, MD;  Location: [place] NEURO ORS;  Service: Neurosurgery;  Laterality: N/A;  Thoracic one-Thoracic five posterior thoracic fusion with pedicle screws   .  Thyroidectomy  N/A  01/09/2014       Procedure: TOTAL THYROIDECTOMY;  Surgeon: Izora Gala, MD;  Location: [facility] OR;  Service: ENT;  Laterality: N/A;       Family History   Problem  Relation  Age of Onset   .  Hypertension  Mother     .  Diabetes  Mother     .  Hypertension  Father     .  Stroke  Father        Social History:  Single. Lives with sister and was working at [employer] PTA. Parents and sister plan on assisting past discharge.  Per reports that she has never smoked but exposed to second hand smoke. She has never used smokeless tobacco. She reports that she does not drink alcohol or use illicit drugs.      Allergies   Allergen  Reactions   .  Tramadol  Nausea And Vomiting       Medications Prior to Admission   Medication  Sig  Dispense  Refill   .  albuterol (PROVENTIL HFA;VENTOLIN HFA) 108 (90 BASE) MCG/ACT inhaler  Inhale 2 puffs into the lungs every 6 (six) hours as needed for wheezing or shortness of breath.         .  diazepam (VALIUM) 5 MG tablet  Take 5 mg by mouth 3 (three) times daily as needed for muscle spasms (pain).         [name] Kitchen  ibuprofen (ADVIL,MOTRIN) 200 MG tablet  Take 600 mg by mouth every 6 (six) hours as needed for mild pain.          .  cyclobenzaprine (FLEXERIL) 5 MG tablet  Take 1 tablet (5 mg total) by mouth 3 (three) times daily as needed for muscle spasms.   30 tablet   0   .  diclofenac (VOLTAREN) 75 MG EC tablet  Take 1 tablet (75 mg total) by mouth 2 (two) times daily.   14 tablet   0   .  HYDROcodone-acetaminophen (NORCO/VICODIN) 5-325 MG per tablet  Take 1 tablet by mouth every 4 (four) hours as needed.   20 tablet   0   .  losartan (COZAAR) 25 MG tablet  Take 0.5 tablets (12.5 mg total) by mouth daily.   30 tablet   0       Home: Home Living Family/patient expects to be discharged to:: Private residence Living Arrangements: Other relatives Available Help at Discharge: Family;Available 24 hours/day Type of Home: House Home Access: Stairs to enter [employer] of Steps: 2 (deep enough for all 4 legs of RW) Entrance Stairs-Rails: None Home Layout: One level Home Equipment: Walker - 2 wheels   Functional History: Prior Function Level of Independence: Independent Comments: Pt works at [employer].  Functional Status:  Mobility: Bed Mobility Overal bed mobility: Needs Assistance;+2 for physical assistance Bed Mobility: Rolling;Sidelying to Sit;Sit to Sidelying Rolling: Mod assist Sidelying to sit: Max assist;+2 for physical assistance Supine to sit: +2 for physical assistance;Max assist Sit to supine: +2 for physical assistance;Total assist Sit to sidelying: Max assist;+2 for physical assistance General bed mobility comments: utilized railing for rolling side to side. A for BLE and for trunk support into sitting. Total A for positioning back into supine Transfers Overall transfer level: Needs assistance General transfer comment: maxi move from bed to recliner with 2 person assist for safety and manipulation of environment Wheelchair Mobility Wheelchair mobility: Yes Wheelchair propulsion: Both upper extremities Wheelchair parts: Supervision/cueing Distance: 85 Wheelchair Assistance Details (indicate cue type and reason): Pt pushed out of the tight room with many obstacles into the wide hallway and she was able to wheel herself with bil upper extremities further down the hall than yesterday and with only supervision assistance.  Next session WC mobility should focus on navigating more obstacles and turns.    ADL: ADL Overall ADL's : Needs assistance/impaired Eating/Feeding: Set up;Bed level Eating/Feeding Details (indicate cue type and reason): pt reports difficulty drinking  from cups with short straws. Provided pt with tubing into ice container to increase independence with drinking.   Grooming: Dance movement psychotherapist;Bed level;Set up Upper Body Bathing: Minimal assitance;Bed level General ADL Comments: Pt with bowel incontinence without awareness, total assist to clean periarea and change pad.   Cognition: Cognition Overall Cognitive Status: Within Functional Limits for tasks assessed Orientation Level: Oriented X4 Cognition Arousal/Alertness: Awake/alert Behavior During Therapy: WFL for tasks assessed/performed Overall Cognitive Status: Within Functional Limits for tasks assessed Area of Impairment: Orientation Orientation Level: Time   Blood pressure 138/77, pulse 88, temperature 98.2 F (36.8 C), temperature source Oral, resp. rate 18, height 5\' 11"  (1.803 m), weight 142.4 kg (313 lb 15 oz), last menstrual period 11/28/2013, SpO2 100.00%. Physical Exam  Nursing note and vitals reviewed. Constitutional: She is oriented to person, place, and time. She appears well-developed and well-nourished.  HENT:   Head: Normocephalic and atraumatic.  Eyes: Conjunctivae are normal. Pupils are equal, round, and reactive to light.  Right neck incision with staples in place.   Cardiovascular: Normal rate and regular rhythm.   Respiratory: Effort normal. No respiratory distress. She has wheezes.  GI: Soft. Bowel sounds are normal. She exhibits no distension. There is no tenderness.  Neurological: She is alert and oriented to person, place, and time.  Speech clear. Follows commands without difficulty. Dense paraparesis with flexor spasms and few beats clonus on the right. Diminished sensation 1/2 below level of injury. Doesn't distinguish pain from gross touch.  Skin: Skin is warm and dry.     Results for orders placed during the hospital encounter of 12/12/13 (from the past 24 hour(s))   GLUCOSE, CAPILLARY     Status: Abnormal     Collection Time      01/15/14  4:45 PM        Result  Value  Ref Range     Glucose-Capillary  155 (*)  70 - 99 mg/dL   GLUCOSE, CAPILLARY     Status: Abnormal     Collection Time      01/15/14  9:24 PM       Result  Value  Ref Range     Glucose-Capillary  148 (*)  70 - 99 mg/dL   CBC     Status: Abnormal     Collection Time  01/16/14  5:47 AM       Result  Value  Ref Range     WBC  5.0   4.0 - 10.5 K/uL     RBC  3.00 (*)  3.87 - 5.11 MIL/uL     Hemoglobin  8.7 (*)  12.0 - 15.0 g/dL     HCT  25.7 (*)  36.0 - 46.0 %     MCV  85.7   78.0 - 100.0 fL     MCH  29.0   26.0 - 34.0 pg     MCHC  33.9   30.0 - 36.0 g/dL     RDW  15.0   11.5 - 15.5 %     Platelets  331   150 - 400 K/uL   APTT     Status: Abnormal     Collection Time      01/16/14  5:47 AM       Result  Value  Ref Range     aPTT  59 (*)  24 - 37 seconds   GLUCOSE, CAPILLARY     Status: Abnormal     Collection Time      01/16/14  6:46 AM       Result  Value  Ref Range     Glucose-Capillary  106 (*)  70 - 99 mg/dL   GLUCOSE, CAPILLARY     Status: Abnormal     Collection Time      01/16/14 11:36 AM       Result  Value  Ref Range     Glucose-Capillary  146 (*)  70 - 99 mg/dL    No results found.   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 Yes Co-Morbidities requiring supervision/potential complications: pain, skin care, neurogenic bladder and bowel Due to bladder management, bowel management, safety, skin/wound care, disease management, medication administration and pain management, does the patient require 24 hr/day rehab nursing? Yes Does the patient require coordinated care of a physician, rehab nurse, PT (1-2 hrs/day, 5 days/week) and OT (1-2 hrs/day, 5 days/week) to address physical and functional  deficits in the context of the above medical diagnosis(es)? Yes Addressing deficits in the following areas: balance, endurance, locomotion, strength, transferring, bowel/bladder control, bathing, dressing, feeding, grooming, toileting and psychosocial support Can the patient actively participate in an intensive therapy program of at least 3 hrs of therapy per day at least 5 days per week? Yes The potential for patient to make measurable gains while on inpatient rehab is good Anticipated functional outcomes upon discharge from inpatient rehab are min assist  with PT, supervision and min assist with OT, n/a with SLP. Estimated rehab length of stay to reach the above functional goals is: 20-27 days Does the patient have adequate social supports to accommodate these discharge functional goals? Yes Anticipated D/C setting: Home Anticipated post D/C treatments: HH therapy and Outpatient therapy Overall Rehab/Functional Prognosis: excellent   RECOMMENDATIONS: This patient's condition is appropriate for continued rehabilitative care in the following setting: CIR Patient has agreed to participate in recommended program. Yes Note that insurance prior authorization may be required for reimbursement for recommended care.   Comment: Rehab Admissions Coordinator to follow up.   Thanks,   [name], MD, Mellody Drown  01/16/2014    Revision History...     Date/Time User Action   01/17/2014 1:56 PM [name], MD Sign   01/17/2014 10:23 AM Bary Leriche, PA-C Share   01/16/2014 4:52 PM Bary Leriche, PA-C Share  View Details Report   Routing History...     Date/Time From To Method   01/17/2014 1:56 PM [name], MD [name], MD In Basket   01/17/2014 1:56 PM [name], MD No Pcp Per Patient In Basket

## 2014-01-19 NOTE — Clinical Social Work Note (Signed)
Clinical Social Worker notified by [facility] that patient will be admitted into inpatient rehab today. CSW will sign off for now as social work intervention is no longer needed. Please consult [place] again if new need arises.  Glendon Axe, MSW, LCSWA [phone] 01/19/2014 8:52 AM

## 2014-01-19 NOTE — Progress Notes (Signed)
Pt returned via stretcher transport from [facility] from radiation treatment. She denies pain or discomfort. No noted distress. Pt stable. Alert, verbal oriented x4. Will continue to monitor.

## 2014-01-19 NOTE — Progress Notes (Signed)
ANTICOAGULATION CONSULT NOTE - Follow Up Consult  Pharmacy Consult:  Lovenox Indication:  New PE  Allergies  Allergen Reactions  . Tramadol Nausea And Vomiting    Patient Measurements: Height: 5\' 11"  (180.3 cm) Weight: 313 lb 15 oz (142.4 kg) IBW/kg (Calculated) : 70.8  Vital Signs: Temp: 97.9 F (36.6 C) (10/29 0952) Temp Source: Oral (10/29 0952) BP: 114/71 mmHg (10/29 0952) Pulse Rate: 93 (10/29 0952)  Labs:  Recent Labs  01/18/14 0805 01/19/14 0007 01/19/14 0510  HGB 8.9* 9.2*  --   HCT 26.7* 27.7*  --   PLT 340 329  --   CREATININE  --   --  0.66    Estimated Creatinine Clearance: 136.4 ml/min (by C-G formula based on Cr of 0.66).     Assessment: 39 YOF with new RLL PE to continue on Lovenox in setting of cancer.  Her renal function and platelet count remains stable and and WNL.  No bleeding reported.   Goal of Therapy:  Anti-Xa level 0.6-1 units/ml 4hrs after LMWH dose given Monitor platelets by anticoagulation protocol: Yes    Plan:  - Continue Lovenox 140mg  SQ Q12H  - PRN CBC   Evanna Washinton D. Mina Marble, PharmD, BCPS Pager:  [phone] 01/19/2014, 10:16 AM

## 2014-01-19 NOTE — Progress Notes (Signed)
I spoke with attending service and we can plan to admit pt to inpt rehab today after return from radiation treatments. I will make arrangements for today. [phone]

## 2014-01-19 NOTE — Progress Notes (Signed)
PMR Admission Coordinator Pre-Admission Assessment   Patient: Sarah [name] is an 48 y.o., female MRN: [id number] DOB: 09-Nov-1965 Height: 5\' 11"  (180.3 cm) Weight: 142.4 kg (313 lb 15 oz)                                                                                                                                                  Insurance Information HMO:     PPO:      PCP:      IPA:      80/20:      OTHER:   PRIMARY:  Medicaid pending per pt's Mom Sarah [name] and sister Sarah [name]; also per family, pt. 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 female with history of HTN, chronic back pain, morbid obesity who was admitted on 12/10/13 with BLE weakness with inability to walk as well as problems with urinary retention and constipation. Work up revealed a lesion at the T3 level of the upper thoracic spine with pathologic compression fracture and epidural tumor resulting in moderate cord compression with evidence of mild spinal cord edema, as well as right suprahilar mass with pulmonary nodules with mediastinal lymph nodes c/w metastatic disease, RUL & RLL PE, tubular cystic masses bilateral adnexal regions and a massive thyroid goiter causing tracheal deviation to the right with narrowing of trachea. She was started on steriods and foley placed for urinary retention. She underwent thoracic laminectomy with  excision of tumor on 09/23 by Dr. Christella Noa and on bedrest pending further surgery. Pathology favoring metastatic adenocarcinoma of lung but metastatic thyroid cancer in differential . 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 Buddy Duty consulted for input. 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 Patient underwent T1-T5 posterolateral arthrodesis with allograft on 10/09 with plans XRT to be initiated 2 weeks post op. Dr. Constance Holster consulted for input and patient underwent total thyroidectomy on 10/19 and extubated without difficulty on 10/20. 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 She was changed back to Lovenox as platelets have stabilized. Dr. Hosie Poisson recommends transition to Xarelto once all surgeries completed.   She continues to have paraparesis and therapy working on bed mobility as well as sitting balance at edge of bed. Recurrent UTI treated with Keflex. Foley remains in place and bowel incontinence reported as well as constipation issues reported. Dr. [name] consulted for XRT to spine for pain palliation as well as prevention of further spinal cord injury. Had simulation on 10/27--10 treatments started on 01/18/14. Right hilar mass without clear pathologic diagnosis and patient to follow up with Dr. Lamonte Sakai in 1-2 months on outpatient basis. Dr. Earlie Server to follow for input on chemotherapy on metastatic Hurtle cell cancer?    Past Medical History  Past Medical History   Diagnosis  Date   .  Hypertension     .  DDD (degenerative disc disease), cervical     .  DJD (degenerative joint disease)     .  Obesity     .  Chronic back pain     .  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   Prior Rehab/Hospitalizations: none prior to onset of current problmes        Current Medications  Current facility-administered medications:acetaminophen (TYLENOL) suppository 650 mg, 650 mg, Rectal, Q4H PRN, Ashok Pall, MD;  acetaminophen (TYLENOL) tablet 650 mg, 650 mg, Oral, Q4H PRN, Ashok Pall, MD, 650 mg at 01/19/14 0525;  albuterol (PROVENTIL) (2.5 MG/3ML) 0.083% nebulizer solution 2.5 mg, 2.5 mg, Inhalation, Q6H PRN, Hilton Sinclair, MD antiseptic oral rinse (CPC / CETYLPYRIDINIUM CHLORIDE 0.05%) solution 7 mL, 7 mL, Mouth Rinse, QID, Collene Gobble, MD, 7 mL at 01/19/14 1200;  cephALEXin (KEFLEX) capsule 500 mg, 500 mg, Oral, QID, Dickie La, MD, 500 mg at 01/19/14 1040;  enoxaparin (LOVENOX) injection 140 mg, 140 mg, Subcutaneous, Q12H, Dickie La, MD, 140 mg at 01/19/14 0449;  hydrocortisone (ANUSOL-HC) 2.5 % rectal cream, , Rectal, BID, Willeen Niece, MD Influenza vac split quadrivalent PF (FLUARIX) injection 0.5 mL, 0.5 mL, Intramuscular, Tomorrow-1000, Dickie La, MD;  insulin aspart (novoLOG) injection 0-15 Units, 0-15 Units, Subcutaneous, TID WC, Lavon Paganini, MD, 2 Units at 01/17/14 1144;  insulin aspart (novoLOG) injection 0-5 Units, 0-5 Units, Subcutaneous, QHS, Lavon Paganini, MD levothyroxine (SYNTHROID, LEVOTHROID) tablet 250 mcg, 250 mcg, Oral, QAC breakfast, Lavon Paganini, MD, 250 mcg at 01/19/14 0745;  LORazepam (ATIVAN) tablet 0.5 mg, 0.5 mg, Oral, Q6H PRN, Dickie La, MD, 0.5 mg at 01/19/14 0449;  menthol-cetylpyridinium (CEPACOL) lozenge 3 mg, 1 lozenge, Oral, PRN, Ashok Pall, MD;  phenol (CHLORASEPTIC) mouth spray 1 spray, 1 spray, Mouth/Throat, PRN, Ashok Pall, MD polyethylene glycol (MIRALAX / GLYCOLAX) packet 17 g, 17 g, Oral, BID PRN, Hilton Sinclair, MD, 17 g at 01/18/14  1417;  senna (SENOKOT) tablet 8.6 mg, 1 tablet, Oral, BID, Ashok Pall, MD, 8.6 mg at 01/19/14 1040;  sodium chloride 0.9 % injection 10-40 mL, 10-40 mL, Intracatheter, Q12H, Newman Pies, MD, 10 mL at 01/16/14 0955;  sodium chloride 0.9 % injection 10-40 mL, 10-40 mL, Intracatheter, PRN, Albertha Ghee, MD, 20 mL at 01/18/14 1701 sodium phosphate (FLEET) 7-19  GM/118ML enema 1 enema, 1 enema, Rectal, Daily PRN, Willeen Niece, MD;  zolpidem (AMBIEN) tablet 5 mg, 5 mg, Oral, QHS PRN, Rush Farmer, MD   Patients Current Diet: Heart Healthy   Precautions / Restrictions Precautions Precautions: Fall;Back Precaution Comments: bil leg weakness, paraplegia Restrictions Weight Bearing Restrictions: No    Prior Activity Level Community (5-7x/wk): Pt. drives and  was employed at [employer] in [employer], working usually 38 hours/week.  Pt out of the home most days.   Home Assistive Devices / Equipment Home Assistive Devices/Equipment: None Home Equipment: Walker - 2 wheels   Prior Functional Level Prior Function Level of Independence: Independent Comments: Pt works at [employer].    Current Functional Level Cognition   Overall Cognitive Status: Within Functional Limits for tasks assessed Orientation Level: Oriented X4     Extremity Assessment (includes Sensation/Coordination)          ADLs   Overall ADL's : Needs assistance/impaired Eating/Feeding: Set up;Bed level Eating/Feeding Details (indicate cue type and reason): pt reports difficulty drinking from cups with short straws. Provided pt with tubing into ice container to increase independence with drinking.   Grooming: Dance movement psychotherapist;Bed level;Set up Upper Body Bathing: Minimal assitance;Bed level General ADL Comments: Pt with bowel incontinence without awareness, total assist to clean periarea and change pad.      Mobility   Overal bed mobility: Needs Assistance;+2 for physical assistance Bed Mobility:  Rolling;Sidelying to Sit;Sit to Sidelying Rolling: Mod assist Sidelying to sit: Max assist;+2 for physical assistance Supine to sit: +2 for physical assistance;Max assist Sit to supine: +2 for physical assistance;Total assist Sit to sidelying: Max assist;+2 for physical assistance General bed mobility comments: utilized railing for rolling side to side. A for BLE and for trunk support into sitting. Total A for positioning back into supine      Transfers   Overall transfer level: Needs assistance General transfer comment: maxi move from bed to recliner with 2 person assist for safety and manipulation of environment      Ambulation / Gait / Stairs / Investment banker, corporate mobility: Yes Wheelchair propulsion: Both upper extremities Wheelchair parts: Supervision/cueing Distance: 64 Wheelchair Assistance Details (indicate cue type and reason): Pt pushed out of the tight room with many obstacles into the wide hallway and she was able to wheel herself with bil upper extremities further down the hall than yesterday and with only supervision assistance.  Next session WC mobility should focus on navigating more obstacles and turns.      Posture / Balance  Dynamic Sitting Balance Sitting balance - Comments: Patient starting to show some progress with sitting balance and tolerance. Patient sat total of 13 min during which she was able to sit up WITHOUT BUE support 2 times totaling ~30 sec each time with MinGuard A. Patient working on unilateral assist as well. Patient able to hold with L UE and was face with R LE while sitting EOB. Cues to use head control in order to adjust to midline      Special needs/care consideration  BiPAP/CPAP  no Oxygen  no Special Bed   Yes, on "Mighty Air" Bed        Skin  Sutures intact over thoracic spine incision; staples removed from neck incision          Bowel mgmt:  Has been having multiple stools with straining and some bleeding; last  BM 01/18/14  After fleets/ Has hemmorhoids Bladder mgmt:  foley catheter Diabetic mgmt A1C 6.7 Daily radiation at [facility] [place] center 10/20 at 1205, 11/2 at 1215, 11/3 at 1240, 11/4 at 1205, 11/5 at 1235, 11/6 at 1225, 11/9 at 1415, 11/10 at 1415. [facility] to pickup 30 mins prior to. We will block out 11 am until 2 pm daily for transport and radiation daily and then last two treatments will block out 1 pm until 4 pm for radiation tranport and treatment. Will need 11 am lunch tray.         Previous Home Environment Living Arrangements: Other relatives Available Help at Discharge: Family;Available 24 hours/day Type of Home: House Home Layout: One level Home Access: Stairs to enter Entrance Stairs-Rails: None Entrance Stairs-Number of Steps: 2 (deep enough for all 4 legs of RW) Bathroom Shower/Tub: Chiropodist: Standard Home Care Services: No   Discharge Living Setting Plans for Discharge Living Setting: Patient's home;Lives with (comment) (lives with sister Sarah [name] and husband Sarah [name]) Type of Home at Discharge: House Discharge Home Layout: Two level;Laundry or work area in basement;Able to live on main level with bedroom/bathroom Discharge Home Access: Stairs to enter Technical brewer of Steps: 2 Discharge Bathroom Shower/Tub: Tub/shower unit Discharge Bathroom Toilet: Standard Discharge Bathroom Accessibility: Yes How Accessible: Accessible via walker Does the patient have any problems obtaining your medications?:  (sister reports due to no insurance)   Social/Family/Support Systems Patient Roles: Other (Comment) (sister, daughter) Anticipated Caregiver: Pt's Mom Sarah [name] states she and pt's sisters will be primary care providers.  Sarah [name] works about 24 hours per week.  Sarah [name] works a split shift of 6-8 am then 2-6 pm daily.  They believe they can arrange to be available 24/7 for pt. Other sister Sarah [name] lives in [place] but plans to help as much  as she can.  she has been at the bedside with each of my visits to patient. Ability/Limitations of Caregiver: sister Sarah [name] on "disability" but independently ambulatory Caregiver Availability: 24/7 Discharge Plan Discussed with Primary Caregiver: Yes Is Caregiver In Agreement with Plan?: Yes Does Caregiver/Family have Issues with Lodging/Transportation while Pt is in Rehab?: No       Goals/Additional Needs Patient/Family Goal for Rehab: min assist for PT; supervision to min assist for OT; n/a SLP Expected length of stay: 20-27 days Cultural Considerations: "[facility]" Equipment Needs: TBD; pt's mom states her husband is a Games developer who built their home and can build a ramp to access the home. Pt/Family Agrees to Admission and willing to participate: Yes Program Orientation Provided & Reviewed with Pt/Caregiver Including Roles  & Responsibilities: Yes     Decrease burden of Care through IP rehab admission: no     Possible need for SNF placement upon discharge:  Not anticipated     Patient Condition: This patient's medical and functional status has changed since the consult dated: 01/16/2014 in which the Rehabilitation Physician determined and documented that the patient's condition is appropriate for intensive rehabilitative care in an inpatient rehabilitation facility. See "History of Present Illness" (above) for medical update. Functional changes are: max assist. Patient's medical and functional status update has been discussed with the Rehabilitation physician and patient remains appropriate for inpatient rehabilitation. Will admit to inpatient rehab today.   Preadmission Screen Completed By:  Cleatrice Burke, 01/19/2014 1:03 PM ______________________________________________________________________    Discussed status with Dr. Letta Pate on 01/19/2014 at 1303 and received telephone approval for admission today.   Admission Coordinator:  Cleatrice Burke, time [id number]  Date 01/19/2014  Cosigned by: [name], MD    [01/19/2014 1:06 PM]  Revision History...     Date/Time User Action   01/19/2014 1:06 PM [name], MD Cosign   01/19/2014 1:03 PM Cleatrice Burke, RN Sign   01/18/2014 4:17 PM Gerlean Ren Share  View Details Report

## 2014-01-19 NOTE — Progress Notes (Signed)
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 Nori Riis.      HPI: Sarah [name] is a 48 y.o. female with history of HTN, chronic back pain, morbid obesity who was admitted on 12/10/13 with BLE weakness with inability to walk as well as problems with urinary retention and constipation. Work up revealed a lesion at the T3 level of the upper thoracic spine with pathologic compression fracture and epidural tumor resulting in moderate cord compression with evidence of mild spinal cord edema, as well as right suprahilar mass with pulmonary nodules with mediastinal lymph nodes c/w metastatic disease, RUL & RLL PE, tubular cystic masses bilateral adnexal regions and a massive thyroid goiter causing tracheal deviation to the right with narrowing of trachea. She was started on steriods and foley placed for urinary retention. She underwent thoracic laminectomy with excision of tumor on 09/23 by Dr. Christella Noa and on bedrest pending further surgery. Pathology favoring metastatic adenocarcinoma of lung but metastatic thyroid cancer in differential . 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 Buddy Duty consulted for input. 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 Patient underwent T1-T5 posterolateral arthrodesis with allograft on 10/09 with plans XRT to be initiated 2 weeks post op. Dr. Constance Holster consulted for input and patient underwent total thyroidectomy on 10/19 and extubated without difficulty on 10/20. 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 She was changed to Lovenox today as platelets  have stabilized and Dr. Hosie Poisson  recommends transition to [facility]. .    She continues to have paraparesis and therapy working on bed mobility as well as sitting balance at edge of bed. Recurrent UTI treated with Keflex. Foley remains in place and bowel incontinence reported. Dr. [name] consulted for XRT to spine for pain palliation as well as prevention of further spinal cord injury. Set for simulation on 10/27--question number of treatments.  Right hilar mass without clear pathologic diagnosis and patient to follow up with Dr. Lamonte Sakai in 1-2 months on outpatient basis.  Dr. Earlie Server to follow for input on chemotherapy on metastatic Hurtle cell cancer? CIR recommended for follow up therapy by MD and rehab team.        Review of Systems  Eyes: Negative for blurred vision and double vision.  Respiratory: Negative for cough and shortness of breath.   Cardiovascular: Negative for chest pain and palpitations.  Gastrointestinal: Positive for constipation. Negative for heartburn and nausea.  Musculoskeletal: Negative for myalgias.        BLE spasms.   Neurological: Positive for sensory change, focal weakness and weakness. Negative for dizziness.  Psychiatric/Behavioral: The patient does not have insomnia.      Past Medical History   Diagnosis  Date   .  Hypertension     .  DDD (degenerative disc disease), cervical     .  DJD (degenerative joint disease)     .  Obesity     .  Chronic back pain     .  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  Tonsillectomy       .  Laminectomy  N/A  12/14/2013       Procedure: THORACIC LAMINECTOMY FOR TUMOR THORACIC THREE;  Surgeon: Ashok Pall, MD;  Location: [facility];  Service: Neurosurgery;  Laterality: N/A;  THORACIC LAMINECTOMY FOR TUMOR THORACIC THREE   .  Posterior lumbar fusion 4 level  N/A  12/30/2013       Procedure: Thoracic one-Thoracic five  posterior thoracic fusion with pedicle screws;  Surgeon: Ashok Pall, MD;  Location: [facility];  Service: Neurosurgery;  Laterality: N/A;  Thoracic one-Thoracic five posterior thoracic fusion with pedicle screws   .  Thyroidectomy  N/A  01/09/2014       Procedure: TOTAL THYROIDECTOMY;  Surgeon: Izora Gala, MD;  [facility];  Service: ENT;  Laterality: N/A;       Family History   Problem  Relation  Age of Onset   .  Hypertension  Mother     .  Diabetes  Mother     .  Hypertension  Father     .  Stroke  Father        Social History:  Single. Lives with sister and was working at [employer]. Parents and sister plan on assisting past discharge.  Per reports that she has never smoked but exposed to second hand smoke. She has never used smokeless tobacco. She reports that she does not drink alcohol or use illicit drugs.      Allergies   Allergen  Reactions   .  Tramadol  Nausea And Vomiting       Medications Prior to Admission   Medication  Sig  Dispense  Refill   .  albuterol (PROVENTIL HFA;VENTOLIN HFA) 108 (90 BASE) MCG/ACT inhaler  Inhale 2 puffs into the lungs every 6 (six) hours as needed for wheezing or shortness of breath.         .  diazepam (VALIUM) 5 MG tablet  Take 5 mg by mouth 3 (three) times daily as needed for muscle spasms (pain).         [name] Kitchen  ibuprofen (ADVIL,MOTRIN) 200 MG tablet  Take 600 mg by mouth every 6 (six) hours as needed for mild pain.          .  cyclobenzaprine (FLEXERIL) 5 MG tablet  Take 1 tablet (5 mg total) by mouth 3 (three) times daily as needed for muscle spasms.   30 tablet   0   .  diclofenac (VOLTAREN) 75 MG EC tablet  Take 1 tablet (75 mg total) by mouth 2 (two) times daily.   14 tablet   0   .  HYDROcodone-acetaminophen (NORCO/VICODIN) 5-325 MG per tablet  Take 1 tablet by mouth every 4 (four) hours as needed.   20 tablet   0   .  losartan (COZAAR) 25 MG tablet  Take 0.5 tablets (12.5 mg total) by mouth daily.   30 tablet   0       Home: Home Living Family/patient expects to be discharged to:: Private residence Living Arrangements: Other relatives Available Help at Discharge: Family;Available 24 hours/day Type of Home: House Home Access: Stairs to enter [employer] of Steps: 2 (deep enough for all 4 legs of RW) Entrance Stairs-Rails: None Home Layout: One level Home Equipment: Walker - 2 wheels   Functional History: Prior Function Level of Independence: Independent Comments: Pt works at [employer].  Functional Status:  Mobility: Bed Mobility Overal bed mobility: Needs Assistance;+2 for physical assistance Bed Mobility: Rolling;Sidelying to Sit;Sit to Sidelying Rolling: Mod assist Sidelying to sit: Max assist;+2 for physical assistance Supine to sit: +2 for physical assistance;Max assist Sit to supine: +2 for physical assistance;Total assist Sit to sidelying: Max assist;+2 for physical assistance General bed mobility comments: utilized railing for rolling side to side. A for BLE and for trunk support into sitting. Total A for positioning back into supine Transfers Overall transfer level: Needs assistance General transfer comment: maxi move from bed to recliner with 2 person assist for safety and manipulation of environment Wheelchair Mobility Wheelchair mobility: Yes Wheelchair propulsion: Both upper extremities Wheelchair parts: Supervision/cueing Distance: 85 Wheelchair Assistance Details (indicate cue type and reason): Pt pushed out of the tight room with many obstacles into the wide hallway and she was able to wheel herself with bil upper extremities further down the hall than yesterday and with only supervision assistance.  Next session WC mobility should focus on navigating more obstacles and turns.    ADL: ADL Overall ADL's : Needs assistance/impaired Eating/Feeding: Set up;Bed level Eating/Feeding Details (indicate cue type and reason): pt reports difficulty drinking  from cups with short straws. Provided pt with tubing into ice container to increase independence with drinking.   Grooming: Dance movement psychotherapist;Bed level;Set up Upper Body Bathing: Minimal assitance;Bed level General ADL Comments: Pt with bowel incontinence without awareness, total assist to clean periarea and change pad.   Cognition: Cognition Overall Cognitive Status: Within Functional Limits for tasks assessed Orientation Level: Oriented X4 Cognition Arousal/Alertness: Awake/alert Behavior During Therapy: WFL for tasks assessed/performed Overall Cognitive Status: Within Functional Limits for tasks assessed Area of Impairment: Orientation Orientation Level: Time   Blood pressure 138/77, pulse 88, temperature 98.2 F (36.8 C), temperature source Oral, resp. rate 18, height 5\' 11"  (1.803 m), weight 142.4 kg (313 lb 15 oz), last menstrual period 11/28/2013, SpO2 100.00%. Physical Exam  Nursing note and vitals reviewed. Constitutional: She is oriented to person, place, and time. She appears well-developed and well-nourished.  HENT:   Head: Normocephalic and atraumatic.  Eyes: Conjunctivae are normal. Pupils are equal, round, and reactive to light.  Right neck incision with staples in place.   Cardiovascular: Normal rate and regular rhythm.   Respiratory: Effort normal. No respiratory distress. She has wheezes.  GI: Soft. Bowel sounds are normal. She exhibits no distension. There is no tenderness.  Neurological: She is alert and oriented to person, place, and time.  Speech clear. Follows commands without difficulty. Dense paraparesis with flexor spasms and few beats clonus on the right. Diminished sensation 1/2 below level of injury. Doesn't distinguish pain from gross touch.  Skin: Skin is warm and dry.     Results for orders placed during the hospital encounter of 12/12/13 (from the past 24 hour(s))   GLUCOSE, CAPILLARY     Status: Abnormal     Collection Time      01/15/14  4:45 PM        Result  Value  Ref Range     Glucose-Capillary  155 (*)  70 - 99 mg/dL   GLUCOSE, CAPILLARY     Status: Abnormal     Collection Time      01/15/14  9:24 PM       Result  Value  Ref Range     Glucose-Capillary  148 (*)  70 - 99 mg/dL   CBC     Status: Abnormal     Collection Time  01/16/14  5:47 AM       Result  Value  Ref Range     WBC  5.0   4.0 - 10.5 K/uL     RBC  3.00 (*)  3.87 - 5.11 MIL/uL     Hemoglobin  8.7 (*)  12.0 - 15.0 g/dL     HCT  25.7 (*)  36.0 - 46.0 %     MCV  85.7   78.0 - 100.0 fL     MCH  29.0   26.0 - 34.0 pg     MCHC  33.9   30.0 - 36.0 g/dL     RDW  15.0   11.5 - 15.5 %     Platelets  331   150 - 400 K/uL   APTT     Status: Abnormal     Collection Time      01/16/14  5:47 AM       Result  Value  Ref Range     aPTT  59 (*)  24 - 37 seconds   GLUCOSE, CAPILLARY     Status: Abnormal     Collection Time      01/16/14  6:46 AM       Result  Value  Ref Range     Glucose-Capillary  106 (*)  70 - 99 mg/dL   GLUCOSE, CAPILLARY     Status: Abnormal     Collection Time      01/16/14 11:36 AM       Result  Value  Ref Range     Glucose-Capillary  146 (*)  70 - 99 mg/dL    No results found.   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 Yes Co-Morbidities requiring supervision/potential complications: pain, skin care, neurogenic bladder and bowel Due to bladder management, bowel management, safety, skin/wound care, disease management, medication administration and pain management, does the patient require 24 hr/day rehab nursing? Yes Does the patient require coordinated care of a physician, rehab nurse, PT (1-2 hrs/day, 5 days/week) and OT (1-2 hrs/day, 5 days/week) to address physical and functional  deficits in the context of the above medical diagnosis(es)? Yes Addressing deficits in the following areas: balance, endurance, locomotion, strength, transferring, bowel/bladder control, bathing, dressing, feeding, grooming, toileting and psychosocial support Can the patient actively participate in an intensive therapy program of at least 3 hrs of therapy per day at least 5 days per week? Yes The potential for patient to make measurable gains while on inpatient rehab is good Anticipated functional outcomes upon discharge from inpatient rehab are min assist  with PT, supervision and min assist with OT, n/a with SLP. Estimated rehab length of stay to reach the above functional goals is: 20-27 days Does the patient have adequate social supports to accommodate these discharge functional goals? Yes Anticipated D/C setting: Home Anticipated post D/C treatments: HH therapy and Outpatient therapy Overall Rehab/Functional Prognosis: excellent   RECOMMENDATIONS: This patient's condition is appropriate for continued rehabilitative care in the following setting: CIR Patient has agreed to participate in recommended program. Yes Note that insurance prior authorization may be required for reimbursement for recommended care.   Comment: Rehab Admissions Coordinator to follow up.   Thanks,   [name], MD, Mellody Drown  01/16/2014    Revision History...     Date/Time User Action   01/17/2014 1:56 PM [name], MD Sign   01/17/2014 10:23 AM Bary Leriche, PA-C Share   01/16/2014 4:52 PM Bary Leriche, PA-C Share  View Details Report   Routing History...     Date/Time From To Method   01/17/2014 1:56 PM [name], MD [name], MD In Basket   01/17/2014 1:56 PM [name], MD No Pcp Per Patient In Basket

## 2014-01-19 NOTE — Progress Notes (Signed)
ANTICOAGULATION CONSULT NOTE - Follow Up Consult  Pharmacy Consult:  Xarelto Indication:  New PE  Allergies  Allergen Reactions  . Tramadol Nausea And Vomiting    Patient Measurements:    Vital Signs: Temp: 97.7 F (36.5 C) (10/29 1712) Temp Source: Oral (10/29 1712) BP: 113/48 mmHg (10/29 1712) Pulse Rate: 99 (10/29 1712)  Labs:  Recent Labs  01/18/14 0805 01/19/14 0007 01/19/14 0510  HGB 8.9* 9.2*  --   HCT 26.7* 27.7*  --   PLT 340 329  --   CREATININE  --   --  0.66    The CrCl is unknown because both a height and weight (above a minimum accepted value) are required for this calculation.   Assessment: 21 YOF with new RLL PE on Lovenox in setting of cancer.  Her renal function and platelet count remains stable and and WNL.  No bleeding reported. Pharmacy has been consulted to transition from Lovenox to Xarelto for her PE. She was receiving Lovenox 140mg  SQ q12h. Will start Xarelto tomorrow morning.   Goal of Therapy:  Monitor platelets by anticoagulation protocol: Yes    Plan:  - Xarelto 15mg  BID with food for 21 days followed by 20mg  once daily in the evening with food - Will educate patient on Xarelto - Monitor for s/sx of bleeding  Andrey Cota. Diona Foley, PharmD Clinical Pharmacist Pager [phone]  01/19/2014, 5:51 PM

## 2014-01-19 NOTE — H&P (Signed)
Physical Medicine and Rehabilitation Admission H&P  Chief Complaint   Patient presents with   .  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 female with history of HTN, chronic back pain, morbid obesity who was admitted on 12/10/13 with BLE weakness, inability to walk as well as problems with urinary retention and constipation. Work up revealed a lesion at the T3 level of the upper thoracic spine with pathologic compression fracture and epidural tumor resulting in moderate cord compression with evidence of mild spinal cord edema, as well as right suprahilar mass with pulmonary nodules with mediastinal lymph nodes c/w metastatic disease, RUL & RLL PE, tubular cystic masses bilateral adnexal regions and a massive thyroid goiter causing tracheal deviation to the right with narrowing of trachea. She was started on steriods and foley placed for urinary retention. She underwent thoracic laminectomy with excision of tumor on 09/23 by Dr. Christella Noa and on bedrest pending further surgery. Pathology favoring metastatic adenocarcinoma of lung but metastatic thyroid cancer in differential . 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 Buddy Duty consulted for input. 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 Patient underwent T1-T5 posterolateral arthrodesis with allograft on 10/09 with plans XRT to be initiated 2 weeks post op. Dr. Constance Holster consulted for input and patient underwent total thyroidectomy on 10/19 and extubated without difficulty on 10/20. 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 She was changed back to Lovenox as platelets have stabilized.  Dr. Hosie Poisson recommends transition to Xarelto once all surgeries completed.  She continues to have paraparesis and therapy working on bed mobility as well as sitting balance at edge of bed. Recurrent UTI treated with Keflex. Foley remains in place and bowel incontinence reported as well as constipation issues reported. Dr. [name] consulted for XRT to spine for pain palliation as well as prevention of further spinal cord injury. Set for simulation on 10/27 with 2 weeks therapy reported. Right hilar mass without clear pathologic diagnosis and patient to follow up with Dr. Lamonte Sakai in 1-2 months on outpatient basis. Dr. Earlie Server to follow for input on chemotherapy on metastatic Hurthle cell cancer? CIR recommended for follow up therapy by MD and rehab team.   Patient denies any swallowing problems, denies neck pain, does have some mid back pain  Review of Systems  HENT: Negative for hearing loss.  Eyes: Negative for blurred vision and double vision.  Respiratory: Negative for cough.  Cardiovascular: Negative for chest pain and palpitations.  Gastrointestinal: Positive for diarrhea and constipation. Negative for nausea and vomiting.  Musculoskeletal: Negative for back pain and myalgias.  Neurological: Positive for sensory change and focal weakness. Negative for headaches.  Psychiatric/Behavioral: The patient is nervous/anxious and has insomnia.   Past Medical History   Diagnosis  Date   .  Hypertension    .  DDD (degenerative disc disease), cervical    .  DJD (degenerative joint disease)    .  Obesity    .  Chronic back pain    .  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  History of rectal fissure     Past Surgical  History   Procedure  Laterality  Date   .  Tonsillectomy     .  Laminectomy  N/A  12/14/2013     Procedure: THORACIC LAMINECTOMY FOR TUMOR THORACIC THREE; Surgeon: Ashok Pall, MD; Location: [facility]; Service: Neurosurgery; Laterality: N/A;  THORACIC LAMINECTOMY FOR TUMOR THORACIC THREE   .  Posterior lumbar fusion 4 level  N/A  12/30/2013     Procedure: Thoracic one-Thoracic five posterior thoracic fusion with pedicle screws; Surgeon: Ashok Pall, MD; Location: [facility]; Service: Neurosurgery; Laterality: N/A; Thoracic one-Thoracic five posterior thoracic fusion with pedicle screws   .  Thyroidectomy  N/A  01/09/2014     Procedure: TOTAL THYROIDECTOMY; Surgeon: Izora Gala, MD; Location: [facility] OR; Service: ENT; Laterality: N/A;    Family History   Problem  Relation  Age of Onset   .  Hypertension  Mother    .  Diabetes  Mother    .  Hypertension  Father    .  Stroke  Father     Social History: Single. Lives with sister and was working at [employer]. Parents and sister plan on assisting past discharge. Per reports that she has never smoked but exposed to second hand smoke. She has never used smokeless tobacco. She reports that she does not drink alcohol or use illicit drugs.  Allergies   Allergen  Reactions   .  Tramadol  Nausea And Vomiting    Medications Prior to Admission   Medication  Sig  Dispense  Refill   .  albuterol (PROVENTIL HFA;VENTOLIN HFA) 108 (90 BASE) MCG/ACT inhaler  Inhale 2 puffs into the lungs every 6 (six) hours as needed for wheezing or shortness of breath.     .  diazepam (VALIUM) 5 MG tablet  Take 5 mg by mouth 3 (three) times daily as needed for muscle spasms (pain).     [name] Kitchen  ibuprofen (ADVIL,MOTRIN) 200 MG tablet  Take 600 mg by mouth every 6 (six) hours as needed for mild pain.     .  cyclobenzaprine (FLEXERIL) 5 MG tablet  Take 1 tablet (5 mg total) by mouth 3 (three) times daily as needed for muscle spasms.  30 tablet  0   .  diclofenac (VOLTAREN) 75 MG EC tablet  Take 1 tablet (75 mg total) by mouth 2 (two) times daily.  14 tablet  0   .  HYDROcodone-acetaminophen (NORCO/VICODIN) 5-325 MG per tablet  Take 1 tablet by mouth every 4 (four) hours as needed.  20 tablet  0   .  losartan (COZAAR) 25  MG tablet  Take 0.5 tablets (12.5 mg total) by mouth daily.  30 tablet  0    Home:  Home Living  Family/patient expects to be discharged to:: Private residence  Living Arrangements: Other relatives  Available Help at Discharge: Family;Available 24 hours/day  Type of Home: House  Home Access: Stairs to enter  [employer] of Steps: 2 (deep enough for all 4 legs of RW)  Entrance Stairs-Rails: None  Home Layout: One level  Home Equipment: Walker - 2 wheels  Functional History:  Prior Function  Level of Independence: Independent  Comments: Pt works at [employer].  Functional Status:  Mobility:  Bed Mobility  Overal bed mobility: Needs Assistance;+2 for physical assistance  Bed Mobility: Rolling;Sidelying to Sit;Sit to Sidelying  Rolling: Mod assist  Sidelying to sit: Max assist;+2 for physical assistance  Supine to sit: +2 for physical assistance;Max assist  Sit to supine: +  2 for physical assistance;Total assist  Sit to sidelying: Max assist;+2 for physical assistance  General bed mobility comments: utilized railing for rolling side to side. A for BLE and for trunk support into sitting. Total A for positioning back into supine  Transfers  Overall transfer level: Needs assistance  General transfer comment: maxi move from bed to recliner with 2 person assist for safety and manipulation of environment   Wheelchair Mobility  Wheelchair mobility: Yes  Wheelchair propulsion: Both upper extremities  Wheelchair parts: Supervision/cueing  Distance: 85  Wheelchair Assistance Details (indicate cue type and reason): Pt pushed out of the tight room with many obstacles into the wide hallway and she was able to wheel herself with bil upper extremities further down the hall than yesterday and with only supervision assistance. Next session WC mobility should focus on navigating more obstacles and turns.  ADL:  ADL  Overall ADL's : Needs assistance/impaired  Eating/Feeding: Set  up;Bed level  Eating/Feeding Details (indicate cue type and reason): pt reports difficulty drinking from cups with short straws. Provided pt with tubing into ice container to increase independence with drinking.  Grooming: Dance movement psychotherapist;Bed level;Set up  Upper Body Bathing: Minimal assitance;Bed level  General ADL Comments: Pt with bowel incontinence without awareness, total assist to clean periarea and change pad.  Cognition:  Cognition  Overall Cognitive Status: Within Functional Limits for tasks assessed  Orientation Level: Oriented X4  Cognition  Arousal/Alertness: Awake/alert  Behavior During Therapy: WFL for tasks assessed/performed  Overall Cognitive Status: Within Functional Limits for tasks assessed  Area of Impairment: Orientation  Orientation Level: Time  Physical Exam:  Blood pressure 102/60, pulse 93, temperature 98.5 F (36.9 C), temperature source Oral, resp. rate 18, height _0  (1.803 m), weight 142.4 kg (313 lb 15 oz), last menstrual period 11/28/2013, SpO2 100.00%.  Physical Exam  Nursing note and vitals reviewed.  Constitutional: She is oriented to person, place, and time. She appears well-developed and well-nourished.  HENT:  Head: Normocephalic and atraumatic.  Eyes: Conjunctivae are normal. Pupils are equal, round, and reactive to light.  Neck: Normal range of motion.  Cardiovascular: Normal rate and regular rhythm.  No murmur heard.  Respiratory: Effort normal and breath sounds normal. No respiratory distress. She has no wheezes. She exhibits no tenderness (sternal area with radiation markings.).  GI: Soft. Bowel sounds are normal. She exhibits no distension. There is no tenderness.  Musculoskeletal: She exhibits no edema and no tenderness.  Neurological: She is alert and oriented to person, place, and time.  Speech clear. Follows commands without difficulty. Dense paraparesis with flexor spasms and few beats clonus on the right. Diminished sensation 1/2 below  Xiphoid.pain from gross touch.  Skin: Skin is warm and dry.  Intergluteal area chapped and abraded due to MAST. Liquid stool on underpad as well as rectal area. Inflamed skin tags with tenderness. Large foam dressing on sacrum.  Psychiatric: She has a normal mood and affect. Her behavior is normal. Judgment and thought content normal.  Motor strength is 5/5 bilateral deltoids, biceps, triceps, grip 0/5 bilateral hip flexors knee extensors ankle dorsiflexor plantar flexor Question trace hip adductors  Results for orders placed during the hospital encounter of 12/12/13 (from the past 48 hour(s))   GLUCOSE, CAPILLARY Status: Abnormal    Collection Time    01/17/14 4:12 PM   Result  Value  Ref Range    Glucose-Capillary  119 (*)  70 - 99 mg/dL   GLUCOSE, CAPILLARY Status: Abnormal  Collection Time    01/17/14 10:16 PM   Result  Value  Ref Range    Glucose-Capillary  106 (*)  70 - 99 mg/dL    Comment 1  Documented in Chart     Comment 2  Notify RN    GLUCOSE, CAPILLARY Status: Abnormal    Collection Time    01/18/14 7:11 AM   Result  Value  Ref Range    Glucose-Capillary  114 (*)  70 - 99 mg/dL   CBC Status: Abnormal    Collection Time    01/18/14 8:05 AM   Result  Value  Ref Range    WBC  6.6  4.0 - 10.5 K/uL    RBC  3.05 (*)  3.87 - 5.11 MIL/uL    Hemoglobin  8.9 (*)  12.0 - 15.0 g/dL    HCT  26.7 (*)  36.0 - 46.0 %    MCV  87.5  78.0 - 100.0 fL    MCH  29.2  26.0 - 34.0 pg    MCHC  33.3  30.0 - 36.0 g/dL    RDW  15.4  11.5 - 15.5 %    Platelets  340  150 - 400 K/uL   GLUCOSE, CAPILLARY Status: Abnormal    Collection Time    01/18/14 11:11 AM   Result  Value  Ref Range    Glucose-Capillary  108 (*)  70 - 99 mg/dL   GLUCOSE, CAPILLARY Status: Abnormal    Collection Time    01/18/14 4:29 PM   Result  Value  Ref Range    Glucose-Capillary  108 (*)  70 - 99 mg/dL   GLUCOSE, CAPILLARY Status: Abnormal    Collection Time    01/18/14 9:13 PM   Result  Value  Ref Range     Glucose-Capillary  111 (*)  70 - 99 mg/dL    Comment 1  Notify RN    CBC Status: Abnormal    Collection Time    01/19/14 12:07 AM   Result  Value  Ref Range    WBC  5.8  4.0 - 10.5 K/uL    RBC  3.18 (*)  3.87 - 5.11 MIL/uL    Hemoglobin  9.2 (*)  12.0 - 15.0 g/dL    HCT  27.7 (*)  36.0 - 46.0 %    MCV  87.1  78.0 - 100.0 fL    MCH  28.9  26.0 - 34.0 pg    MCHC  33.2  30.0 - 36.0 g/dL    RDW  15.0  11.5 - 15.5 %    Platelets  329  150 - 400 K/uL   BASIC METABOLIC PANEL Status: Abnormal    Collection Time    01/19/14 5:10 AM   Result  Value  Ref Range    Sodium  138  137 - 147 mEq/L    Potassium  3.4 (*)  3.7 - 5.3 mEq/L    Chloride  98  96 - 112 mEq/L    CO2  27  19 - 32 mEq/L    Glucose, Bld  91  70 - 99 mg/dL    BUN  10  6 - 23 mg/dL    Creatinine, Ser  0.66  0.50 - 1.10 mg/dL    Calcium  8.4  8.4 - 10.5 mg/dL    GFR calc non Af Amer  >90  >90 mL/min    GFR calc Af Amer  >90  >90 mL/min  Comment:  (NOTE)     The eGFR has been calculated using the CKD EPI equation.     This calculation has not been validated in all clinical situations.     eGFR's persistently <90 mL/min signify possible Chronic Kidney     Disease.    Anion gap  13  5 - 15   GLUCOSE, CAPILLARY Status: Abnormal    Collection Time    01/19/14 6:49 AM   Result  Value  Ref Range    Glucose-Capillary  114 (*)  70 - 99 mg/dL   GLUCOSE, CAPILLARY Status: Abnormal    Collection Time    01/19/14 11:14 AM   Result  Value  Ref Range    Glucose-Capillary  135 (*)  70 - 99 mg/dL    No results found.  Medical Problem List and Plan:  1. 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  2. DVT Prophylaxis/Anticoagulation: Pharmaceutical: Lovenox-will change to Xarelto per Hem/Onc input.  3. Pain Management: Denies back pain. But will need medication to help manage spacticity.  4. Mood: Reports problems sleeping due to current situation  and prn ativan ineffective. She is willing to start low dose antidepressant. Will have chaplin follow for spiritual support as well as LCSW to follow for evaluation and support.  5. Neuropsych: This patient is capable of making decisions on her own behalf.  6. Skin/Wound Care: Air mattress overlay for prevention of break down. Start educating patient and family on need for pressure relief measures.  7. Fluids/Electrolytes/Nutrition: Monitor I/O. Offer supplements if intake poor. Routine check of lytes.  8. Neurogenic bladder: Will change out foley today--continue for now.  9. Neurogenic bowel: Will check KUB for baseline and start on bowel program.  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 For RAI at 6-8 weeks post op.  11. HTN: Will monitor every 8 hours. No medications as this time. Monitor for orthostatic changes once OOB.  12 PCOS? With hyperglycemia: Hgb A1c-6.7. Will modify diet to CM and have RD educate patient on restrictions.  13. E coli UTI--recurrent: continue Keflex D # 6/7.  14. Hemorrhoids: continue proctofoam cream bid in addition to good rectal hygiene to prevent irritation from stool.  Post Admission Physician Evaluation:  Functional deficits secondary to Epidural spinal tumor with T3 cord compression and resulting paraplegia/incomplete myelopathy.  1. [name] admitted to receive collaborative, interdisciplinary care between the physiatrist, rehab nursing staff, and therapy team. 2. Patient's level of medical complexity and substantial therapy needs in context of that medical necessity cannot be provided at a lesser intensity of care such as a SNF. 3. Patient has experienced substantial functional loss from his/her baseline which was documented above under the "Functional History" and "Functional Status" headings. Judging by the patient's diagnosis, physical exam, and functional history, the patient has potential for functional progress which will result  in measurable gains while on inpatient rehab. These gains will be of substantial and practical use upon discharge in facilitating mobility and self-care at the household level. 4. Physiatrist will provide 24 hour management of medical needs as well as oversight of the therapy plan/treatment and provide guidance as appropriate regarding the interaction of the two. 5. 24 hour rehab nursing will assist with bladder management, bowel management, safety, skin/wound care, disease management, medication administration, pain management and patient education and help integrate therapy concepts, techniques,education, etc. 6. PT will assess and treat for/with: pre gait, gait training, endurance ,  safety, equipment, neuromuscular re education. Goals are: Modified independent wheelchair level. 7. OT will assess and treat for/with: ADLs, Cognitive perceptual skills, Neuromuscular re education, safety, endurance, equipment. Goals are: Modified independent wheelchair level. Therapy may proceed with showering this patient. 8. SLP will assess and treat for/with: NA. Goals are: NA. 9. Case Management and Social Worker will assess and treat for psychological issues and discharge planning. 10. Team conference will be held weekly to assess progress toward goals and to determine barriers to discharge. 11. Patient will receive at least 3 hours of therapy per day at least 5 days per week. 12. ELOS: 18-22D  13. Prognosis: good [name] M.D. [facility] FAAPM&R (Sports Med, Neuromuscular Med) Diplomate Am Board of Electrodiagnostic Med    01/19/2014

## 2014-01-20 ENCOUNTER — Ambulatory Visit
Admit: 2014-01-20 | Discharge: 2014-01-20 | Disposition: A | Discharge status: Home / Self Care | Payer: Medicaid Other | Attending: Radiation Oncology | Admitting: Radiation Oncology

## 2014-01-20 ENCOUNTER — Inpatient Hospital Stay
Admit: 2014-01-20 | Discharge: 2014-01-20 | Disposition: A | Discharge status: Home / Self Care | Payer: Medicaid Other | Attending: Radiation Oncology | Admitting: Radiation Oncology

## 2014-01-20 ENCOUNTER — Inpatient Hospital Stay (HOSPITAL_COMMUNITY): Payer: Medicaid Other | Admitting: Occupational Therapy

## 2014-01-20 ENCOUNTER — Inpatient Hospital Stay (HOSPITAL_COMMUNITY): Payer: Medicaid Other

## 2014-01-20 ENCOUNTER — Encounter: Payer: Self-pay | Admitting: Radiation Oncology

## 2014-01-20 ENCOUNTER — Inpatient Hospital Stay (HOSPITAL_COMMUNITY): Payer: Medicaid Other | Admitting: Physical Therapy

## 2014-01-20 DIAGNOSIS — D34 Benign neoplasm of thyroid gland: Secondary | ICD-10-CM

## 2014-01-20 DIAGNOSIS — C7951 Secondary malignant neoplasm of bone: Secondary | ICD-10-CM

## 2014-01-20 DIAGNOSIS — G8220 Paraplegia, unspecified: Secondary | ICD-10-CM | POA: Diagnosis present

## 2014-01-20 LAB — GLUCOSE, CAPILLARY
GLUCOSE-CAPILLARY: 131 mg/dL — AB (ref 70–99)
Glucose-Capillary: 96 mg/dL (ref 70–99)
Glucose-Capillary: 153 mg/dL — ABNORMAL HIGH (ref 70–99)
Glucose-Capillary: 91 mg/dL (ref 70–99)

## 2014-01-20 LAB — CBC WITH DIFFERENTIAL/PLATELET
BASOS ABS: 0 10*3/uL (ref 0.0–0.1)
Basophils Relative: 0 % (ref 0–1)
EOS ABS: 0.1 10*3/uL (ref 0.0–0.7)
EOS PCT: 2 % (ref 0–5)
HEMATOCRIT: 28.4 % — AB (ref 36.0–46.0)
HEMOGLOBIN: 9.2 g/dL — AB (ref 12.0–15.0)
Lymphocytes Relative: 39 % (ref 12–46)
Lymphs Abs: 1.9 10*3/uL (ref 0.7–4.0)
MCH: 27.8 pg (ref 26.0–34.0)
MCHC: 32.4 g/dL (ref 30.0–36.0)
MCV: 85.8 fL (ref 78.0–100.0)
MONOS PCT: 11 % (ref 3–12)
Monocytes Absolute: 0.5 10*3/uL (ref 0.1–1.0)
NEUTROS ABS: 2.4 10*3/uL (ref 1.7–7.7)
Neutrophils Relative %: 48 % (ref 43–77)
Platelets: 343 10*3/uL (ref 150–400)
RBC: 3.31 MIL/uL — ABNORMAL LOW (ref 3.87–5.11)
RDW: 15.2 % (ref 11.5–15.5)
WBC: 4.9 10*3/uL (ref 4.0–10.5)

## 2014-01-20 LAB — COMPREHENSIVE METABOLIC PANEL
ALBUMIN: 2.5 g/dL — AB (ref 3.5–5.2)
ALT: 10 U/L (ref 0–35)
AST: 16 U/L (ref 0–37)
Alkaline Phosphatase: 126 U/L — ABNORMAL HIGH (ref 39–117)
Anion gap: 14 (ref 5–15)
BILIRUBIN TOTAL: 0.5 mg/dL (ref 0.3–1.2)
BUN: 9 mg/dL (ref 6–23)
CALCIUM: 8.4 mg/dL (ref 8.4–10.5)
CO2: 27 mEq/L (ref 19–32)
Chloride: 97 mEq/L (ref 96–112)
Creatinine, Ser: 0.69 mg/dL (ref 0.50–1.10)
GFR calc Af Amer: >90 mL/min (ref >90)
GFR calc non Af Amer: >90 mL/min (ref >90)
Glucose, Bld: 99 mg/dL (ref 70–99)
Potassium: 3.4 mEq/L — ABNORMAL LOW (ref 3.7–5.3)
Sodium: 138 mEq/L (ref 137–147)
TOTAL PROTEIN: 5.9 g/dL — AB (ref 6.0–8.3)

## 2014-01-20 MED ORDER — BOOST / RESOURCE BREEZE PO LIQD
1.0000 | Freq: Two times a day (BID) | ORAL | Status: DC
  Administered 2014-01-20 – 2014-01-21 (×2): 1 via ORAL

## 2014-01-20 MED ORDER — SODIUM CHLORIDE 0.9 % IJ SOLN
10.0000 mL | INTRAMUSCULAR | Status: DC | PRN
  Administered 2014-01-20: 20 mL

## 2014-01-20 MED ORDER — HYDROCODONE-ACETAMINOPHEN 5-325 MG PO TABS
1.0000 | ORAL_TABLET | ORAL | Status: DC | PRN
  Administered 2014-01-21 – 2014-01-24 (×9): 1 via ORAL
  Administered 2014-01-24: 2 via ORAL
  Administered 2014-01-25 – 2014-02-10 (×47): 1 via ORAL
  Filled 2014-01-20 (×3): qty 1
  Filled 2014-01-20 (×3): qty 2
  Filled 2014-01-20 (×14): qty 1
  Filled 2014-01-20: qty 2
  Filled 2014-01-20 (×4): qty 1
  Filled 2014-01-20: qty 2
  Filled 2014-01-20: qty 1
  Filled 2014-01-20: qty 2
  Filled 2014-01-20 (×7): qty 1
  Filled 2014-01-20: qty 2
  Filled 2014-01-20 (×3): qty 1
  Filled 2014-01-20 (×2): qty 2
  Filled 2014-01-20 (×9): qty 1
  Filled 2014-01-20: qty 2
  Filled 2014-01-20 (×7): qty 1

## 2014-01-20 NOTE — Evaluation (Signed)
Physical Therapy Assessment and Plan  Patient Details  Name: Sarah [name] MRN: [id number] Date of Birth: [birth year]  PT Diagnosis: Abnormal posture, Coordination disorder, Difficulty walking, Dizziness and giddiness, Edema, Hypertonia, Impaired sensation, Muscle weakness, Paraplegia and Pain in thoracic spine Rehab Potential: Good ELOS: 3 weeks   Today's Date: 01/20/2014 PT Individual Time: 0350-0938 Treatment Session 2: 1500-1600 PT Individual Time Calculation (min): 65 min   Treatment Session 2: 60 minutes  Problem List:  Patient Active Problem List   Diagnosis Date Noted  . Metastatic cancer to spine 01/19/2014  . Rotator cuff tendonitis   . Acute postoperative respiratory failure 01/09/2014  . 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 Spine metastasis 12/15/2013  . Leg weakness, bilateral 12/13/2013  . Intractable low back pain 12/10/2013  . Back pain 12/10/2013    Past Medical History:  Past Medical History  Diagnosis Date  . Hypertension   . DDD (degenerative disc disease), cervical   . DJD (degenerative joint disease)   . Obesity   . Chronic back pain   . 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 History of rectal fissure   . Rotator cuff tendonitis right   Past Surgical History:  Past Surgical History  Procedure Laterality Date  . Tonsillectomy    . Laminectomy N/A 12/14/2013    Procedure: THORACIC LAMINECTOMY FOR TUMOR THORACIC THREE;  Surgeon: Ashok Pall, MD;  Location: [facility];  Service: Neurosurgery;  Laterality: N/A;  THORACIC LAMINECTOMY FOR TUMOR THORACIC THREE  . Posterior lumbar fusion 4 level N/A 12/30/2013    Procedure: Thoracic one-Thoracic five posterior thoracic fusion with pedicle screws;  Surgeon: Ashok Pall, MD;  Location: [place] [facility];  Service: Neurosurgery;  Laterality: N/A;  Thoracic one-Thoracic five posterior thoracic fusion with pedicle screws   . Thyroidectomy N/A 01/09/2014    Procedure: TOTAL THYROIDECTOMY;  Surgeon: Izora Gala, MD;  [facility];  Service: ENT;  Laterality: N/A;    Assessment & Plan Clinical Impression: Sarah [name] is a 48 y.o. female with history of HTN, chronic back pain, morbid obesity who was admitted on 12/10/13 with BLE weakness, inability to walk as well as problems with urinary retention and constipation. Work up revealed a lesion at the T3 level of the upper thoracic spine with pathologic compression fracture and epidural tumor resulting in moderate cord compression with evidence of mild spinal cord edema, as well as right suprahilar mass with pulmonary nodules with mediastinal lymph nodes c/w metastatic disease, RUL & RLL PE, tubular cystic masses bilateral adnexal regions and a massive thyroid goiter causing tracheal deviation to the right with narrowing of trachea. She was started on steriods and foley placed for urinary retention. She underwent thoracic laminectomy with excision of tumor on 09/23 by Dr. Christella Noa and on bedrest pending further surgery. Pathology favoring metastatic adenocarcinoma of lung but metastatic thyroid cancer in differential . 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 Buddy Duty consulted for input. 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 Patient underwent T1-T5 posterolateral arthrodesis with allograft on 10/09 with plans XRT to be initiated 2 weeks post op. Dr. Constance Holster consulted for input and patient underwent total thyroidectomy on 10/19 and extubated without difficulty on 10/20. 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 She was changed back to Lovenox as platelets  have stabilized. Dr. Hosie Poisson recommends transition to Xarelto once all surgeries completed.  She continues to have paraparesis and therapy working on bed mobility as well as sitting balance at edge of bed. Recurrent UTI treated with Keflex. Foley remains in place and bowel incontinence reported as well as constipation issues reported. Dr. [name] consulted for XRT to spine for pain palliation as well as prevention of further spinal cord injury. Set for simulation on 10/27 with 2 weeks therapy reported. Right hilar mass without clear pathologic diagnosis and patient to follow up with Dr. Lamonte Sakai in 1-2 months on outpatient basis. Dr. Earlie Server to follow for input on chemotherapy on metastatic Hurthle cell cancer? CIR recommended for follow up therapy by MD and rehab team.    Patient transferred to [facility] on 01/19/2014 .   Patient currently requires total with mobility secondary to muscle weakness and muscle paralysis, decreased cardiorespiratoy endurance, abnormal tone and decreased coordination and decreased sitting balance, decreased standing balance, decreased postural control and decreased balance strategies.  Prior to hospitalization, patient was independent  with mobility and lived with Family in a House home.  Home access is 2 (deep enough for all 4 legs of RW)Stairs to enter.  Patient will benefit from skilled PT intervention to maximize safe functional mobility, minimize fall risk and decrease caregiver burden for planned discharge home with 24 hour assist.  Anticipate patient will benefit from follow up [facility] at discharge.  PT - End of Session Activity Tolerance: Tolerates 10 - 20 min activity with multiple rests Endurance Deficit: Yes Endurance Deficit Description: muscular and cardiorespiratory PT Assessment Rehab Potential: Good Barriers to Discharge: Inaccessible home environment;Decreased caregiver support Barriers to Discharge Comments: 2 steps to enter at home PT Patient demonstrates  impairments in the following area(s): Balance;Behavior;Edema;Endurance;Motor;Pain;Sensory;Safety PT Transfers Functional Problem(s): Bed Mobility;Bed to Chair;Car;Furniture PT Locomotion Functional Problem(s): Ambulation;Wheelchair Mobility;Stairs PT Plan PT Intensity: Minimum of 1-2 x/day ,45 to 90 minutes PT Frequency: 5 out of 7 days PT Duration Estimated Length of Stay: 3 weeks PT Treatment/Interventions: Ambulation/gait training;Discharge planning;Functional mobility training;Psychosocial support;Therapeutic Activities;Balance/vestibular training;Disease management/prevention;Neuromuscular re-education;Therapeutic Exercise;Wheelchair propulsion/positioning;DME/adaptive equipment instruction;Pain management;Splinting/orthotics;UE/LE Strength taining/ROM;Community reintegration;Functional electrical stimulation;Patient/family education;Stair training;UE/LE Coordination activities PT Transfers Anticipated Outcome(s): min A PT Locomotion Anticipated Outcome(s): mod I w/c  PT Recommendation Follow Up Recommendations: 24 hour supervision/assistance;Home health PT Patient destination: Home Equipment Recommended: To be determined;Wheelchair (measurements);Wheelchair cushion (measurements)  Skilled Therapeutic Intervention Treatment Session 1: PT Evaluation: PT completes MMT, tone assessment, sensation/proprioception assessment, and functional mobility assessment. Pt presents as a functionally complete paraplegic, although sensation is somewhat preserved. Pt is very fearful of falling and currently req up to +2 assist for bed mobility and scoot transfers with slideboard.   Therapeutic Activity: PT instructs pt in rolling R and L in bed with bedrail req mod A for B LEs and to maintain spinal precautions x 4 reps total.  PT instructs pt in L side lie to sit transfer req +2 assist with the rail.  PT sets pt up for scoot transfer from bed to w/c, but pt is extremely apprehensive and nervous, so pt  returned sit to supine req +2 assist.  Pt is able to place bed in [place], reach rail at head of bed and pull self up in bed.   Treatment Session 2: W/C Management: PT adjusts pt's headrest for appropriate support  for when pt is tilted back. PT demonstrates tilt function to pt and educates her about the importance to be tilted back and upright again for pressure relief, even though she is on a gel/foam pressure relieving cushion. Pt req assist for all parts management.   Therapeutic Activity: PT sets pt up with wooden step under feet and instructs pt in head-hips relationship for w/c to/from mat transfer req +2 assist with slideboard.   Neuromuscular Reeducation: PT instructs pt in dynamic sitting balance activity including reaching min and mod excursions with close SBA-mod A as needed.  PT instructs pt in leaning on R forearm x 5 reps and L forearm x 5 reps to prepare for functional activities related to toileting and placing/removing the slideboard.   Pt is a good candidate for IPR. Pt will benefit from work on above identified functional impairments. Continue per PT POC.     PT Evaluation Precautions/Restrictions Precautions Precautions: Fall;Back Precaution Comments: bil leg weakness, paraplegia Restrictions Weight Bearing Restrictions: No General Chart Reviewed: Yes Family/Caregiver Present: No Vital Signs  Pain Pain Assessment Pain Assessment: 0-10 Pain Score: 2  Pain Type: Surgical pain Pain Location: Back Pain Orientation: Mid Pain Descriptors / Indicators: Aching Pain Onset: On-going Pain Intervention(s): Emotional support;Repositioned Multiple Pain Sites: No Treatment Session 2:Pt c/o 3/10 pain in upper back at surgical site; PT uses repositioning to decrease pain.   Home Living/Prior Functioning Home Living Available Help at Discharge: Family;Available 24 hours/day Type of Home: House Home Access: Stairs to enter [employer] of Steps: 2 (deep  enough for all 4 legs of RW) Entrance Stairs-Rails: None Home Layout: One level  Lives With: Family Prior Function Level of Independence: Independent with basic ADLs;Independent with homemaking with ambulation;Independent with gait;Independent with transfers  Able to Take Stairs?: Yes Driving: Yes Vocation: Full time employment Vision/Perception     Cognition Overall Cognitive Status: Within Functional Limits for tasks assessed Arousal/Alertness: Awake/alert Orientation Level: Oriented X4 Attention: Focused;Sustained Focused Attention: Appears intact Sustained Attention: Appears intact Alternating Attention: Appears intact Memory: Appears intact Awareness: Appears intact Problem Solving: Appears intact Safety/Judgment: Appears intact Sensation Sensation Light Touch: Impaired Detail Light Touch Impaired Details: Impaired RLE;Impaired LLE (trunk diminished to touch) Stereognosis: Not tested Hot/Cold: Not tested Proprioception: Impaired Detail Proprioception Impaired Details: Absent RLE;Absent LLE Coordination Gross Motor Movements are Fluid and Coordinated: No Fine Motor Movements are Fluid and Coordinated: Yes Coordination and Movement Description: paraplegia Finger Nose Finger Test: wfl bilaterally Heel Shin Test: unable to complete Motor  Motor Motor: Paraplegia;Abnormal tone Motor - Skilled Clinical Observations: R ankle clonus x 3 beats with quick stretch to achilles  Mobility Bed Mobility Bed Mobility: Rolling Right;Rolling Left Rolling Right: 3: Mod assist;With rail Rolling Right Details: Manual facilitation for placement Rolling Left: 3: Mod assist;With rail Rolling Left Details: Manual facilitation for placement Supine to Sit: 1: +2 Total assist Supine to Sit Details: Manual facilitation for placement;Verbal cues for technique Supine to Sit Details (indicate cue type and reason): hand placement Sit to Supine: 1: +2 Total assist Sit to Supine - Details:  Verbal cues for technique;Manual facilitation for placement Transfer: Scoot transfer with slideboard req +2 assist; manual faciliation for placement and verbal cues for technique Locomotion  Ambulation Ambulation: No Gait Gait: No Stairs / Additional Locomotion Stairs: No  W/C Propulsion: total assist; W/C Parts management: needs assist Trunk/Postural Assessment  Cervical Assessment Cervical Assessment: Within Functional Limits Thoracic Assessment Thoracic Assessment: Exceptions to [facility] Thoracic AROM Overall Thoracic AROM: Deficits;Due to precautions Overall  Thoracic AROM Comments: spinal precautions Lumbar Assessment Lumbar Assessment: Within Functional Limits Postural Control Postural Control: Deficits on evaluation Head Control: head forward and down at edge of bed Trunk Control: relies heavily on bedrail edge of bed and external support, progressing to CGA Righting Reactions: slowed Protective Responses: slowed Postural Limitations: poor trunk control edge of bed (unstable surface)  Balance Balance Balance Assessed: Yes Static Sitting Balance Static Sitting - Balance Support: Bilateral upper extremity supported;Feet supported Static Sitting - Level of Assistance: 3: Mod assist (pt progresses to CGA with practice, while holding L bedrail) Dynamic Sitting Balance Dynamic Sitting - Balance Support: Bilateral upper extremity supported;During functional activity;Feet supported Dynamic Sitting - Level of Assistance: 1: +2 Total assist Dynamic Sitting - Balance Activities: Lateral lean/weight shifting;Trunk control activities Extremity Assessment  RUE Assessment RUE Assessment: Within Functional Limits   RLE Assessment RLE Assessment: Exceptions to [facility] RLE AROM (degrees) Overall AROM Right Lower Extremity: Deficits;Due to decreased strength RLE PROM (degrees) Overall PROM Right Lower Extremity: Within functional limits for tasks assessed RLE Strength RLE Overall Strength:  Deficits RLE Overall Strength Comments: trace hip extension noted, otherwise 0/5 RLE Tone RLE Tone: Modified Ashworth Modified Ashworth Scale for Grading Hypertonia RLE: Slight increase in muscle tone, manifested by a catch, followed by minimal resistance throughout the remainder (less than half) of the ROM RLE Tone Comments: R hip adductors and R hamstrings; 3 beats ankle clonus noted with quick stretch to achilles LLE Assessment LLE Assessment: Exceptions to WFL LLE AROM (degrees) Overall AROM Left Lower Extremity: Deficits;Due to decreased strength LLE PROM (degrees) Overall PROM Left Lower Extremity: Within functional limits for tasks assessed LLE Strength LLE Overall Strength: Deficits LLE Overall Strength Comments: trace hip extension, otherwise 0/5 LLE Tone LLE Tone: Modified Ashworth Modified Ashworth Scale for Grading Hypertonia LLE: Slight increase in muscle tone, manifested by a catch, followed by minimal resistance throughout the remainder (less than half) of the ROM LLE Tone Comments: hip adductors and hamstrings  FIM:  FIM - Control and instrumentation engineer Devices: Sliding board;Arm rests Bed/Chair Transfer: 1: Supine > Sit: Total A (helper does all/Pt. < 25%);1: Sit > Supine: Total A (helper does all/Pt. < 25%);1: Bed > Chair or W/C: Total A (helper does all/Pt. < 25%);1: Chair or W/C > Bed: Total A (helper does all/Pt. < 25%);1: Two helpers FIM - Locomotion: Wheelchair Locomotion: Wheelchair: 1: Total Assistance/staff pushes wheelchair (Pt<25%) FIM - Locomotion: Ambulation Locomotion: Ambulation: 0: Activity did not occur FIM - Locomotion: Stairs Locomotion: Stairs: 0: Activity did not occur   Refer to Care Plan for Long Term Goals  Recommendations for other services: None  Discharge Criteria: Patient will be discharged from PT if patient refuses treatment 3 consecutive times without medical reason, if treatment goals not met, if there is a change in  medical status, if patient makes no progress towards goals or if patient is discharged from hospital.  The above assessment, treatment plan, treatment alternatives and goals were discussed and mutually agreed upon: by patient  [facility] M 01/20/2014, 12:51 PM

## 2014-01-20 NOTE — Progress Notes (Signed)
INITIAL NUTRITION ASSESSMENT  DOCUMENTATION CODES Per approved criteria  -Morbid Obesity   INTERVENTION: Provide Resource Breeze po BID, each supplement provides 250 kcal and 9 grams of protein.  NUTRITION DIAGNOSIS: Inadequate oral intake related to decreased appetite as evidenced by meal completion of 50-85%.   Goal: Pt to meet >/= 90% of their estimated nutrition needs   Monitor:  PO intake, weight trends, labs, I/O's  Reason for Assessment: MST and low braden score  48 y.o. female  Admitting Dx: Metastatic cancer to spine  ASSESSMENT: Pt with history of HTN, chronic back pain, morbid obesity who was admitted on 12/10/13 with BLE weakness, inability to walk as well as problems with urinary retention and constipation. Work up revealed a lesion at the T3 level of the upper thoracic spine with pathologic compression fracture and epidural tumor resulting in moderate cord compression with evidence of mild spinal cord edema, as well as right suprahilar mass with pulmonary nodules with mediastinal lymph nodes c/w metastatic disease.  Pt reports her appetite is decreased, which has happened just recently. Pt report a few days ago, she was eating great with no difficulties. Current meal completion is 50-85%. Pt reports she has lost weight with her usual body weight of 315 lbs. Noted pt with a 5% weight loss in one month. Pt reports she is unsure of the cause of the weight loss. Pt is agreeable on [employer] as she reports she does not like the "milky and thick" supplements. Pt was encouraged to eat her food at meals.   Labs: Low potassium. High alkaline phosphatase.  Height: Ht Readings from Last 1 Encounters:  01/19/14 5\' 11"  (1.803 m)    Weight: Wt Readings from Last 1 Encounters:  01/19/14 299 lb 13.2 oz (136 kg)    Ideal Body Weight: 155 lbs  % Ideal Body Weight: 193%  Wt Readings from Last 10 Encounters:  01/19/14 299 lb 13.2 oz (136 kg)  01/13/14 313 lb 15 oz  (142.4 kg)  01/13/14 313 lb 15 oz (142.4 kg)  01/13/14 313 lb 15 oz (142.4 kg)  01/13/14 313 lb 15 oz (142.4 kg)  12/09/13 315 lb (142.883 kg)  12/04/13 320 lb (145.151 kg)  01/18/13 320 lb (145.151 kg)  12/09/10 275 lb (124.739 kg)  10/02/10 275 lb (124.739 kg)    Usual Body Weight: 315 lbs  % Usual Body Weight: 95%  BMI:  Body mass index is 41.84 kg/(m^2). Morbid obesity  Estimated Nutritional Needs: Kcal: 2100-2400 Protein: 135-150 grams Fluid: 2.1 - 2.4 L/day  Skin: incision on back and neck  Diet Order: Cardiac  EDUCATION NEEDS: -Education not appropriate at this time   Intake/Output Summary (Last 24 hours) at 01/20/14 1010 Last data filed at 01/20/14 0900  Gross per 24 hour  Intake    460 ml  Output    550 ml  Net    -90 ml    Last BM: 10/29  Labs:   Recent Labs Lab 01/14/14 0501 01/19/14 0510 01/20/14 0340  NA 141 138 138  K 3.3* 3.4* 3.4*  CL 101 98 97  CO2 28 27 27   BUN 7 10 9   CREATININE 0.76 0.66 0.69  CALCIUM 7.8* 8.4 8.4  GLUCOSE 110* 91 99    CBG (last 3)   Recent Labs  01/19/14 1625 01/19/14 2109 01/20/14 0625  GLUCAP 135* 118* 96    Scheduled Meds: . bisacodyl  10 mg Rectal Q0600  . cephALEXin  500 mg Oral QID  .  hydrocortisone-pramoxine  1 applicator Rectal BID  . insulin aspart  0-15 Units Subcutaneous TID WC  . insulin aspart  0-5 Units Subcutaneous QHS  . levothyroxine  250 mcg Oral QAC breakfast  . rivaroxaban   Does not apply Once  . Rivaroxaban  15 mg Oral BID WC  . [START ON 02/10/2014] rivaroxaban  20 mg Oral Daily  . senna  2 tablet Oral QHS    Continuous Infusions:   Past Medical History  Diagnosis Date  . Hypertension   . DDD (degenerative disc disease), cervical   . DJD (degenerative joint disease)   . Obesity   . Chronic back pain   . 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 History of rectal fissure   . Rotator cuff tendonitis right    Past Surgical History   Procedure Laterality Date  . Tonsillectomy    . Laminectomy N/A 12/14/2013    Procedure: THORACIC LAMINECTOMY FOR TUMOR THORACIC THREE;  Surgeon: Ashok Pall, MD;  Location: [place] [facility] ORS;  Service: Neurosurgery;  Laterality: N/A;  THORACIC LAMINECTOMY FOR TUMOR THORACIC THREE  . Posterior lumbar fusion 4 level N/A 12/30/2013    Procedure: Thoracic one-Thoracic five posterior thoracic fusion with pedicle screws;  Surgeon: Ashok Pall, MD;  Location: [place] NEUR[facility] ORS;  Service: Neurosurgery;  Laterality: N/A;  Thoracic one-Thoracic five posterior thoracic fusion with pedicle screws  . Thyroidectomy N/A 01/09/2014    Procedure: TOTAL THYROIDECTOMY;  Surgeon: Izora Gala, MD;  Location: [place];  Service: ENT;  Laterality: N/A;    Kallie Locks, MS, RD, LDN Pager # [phone] After hours/ weekend pager # [phone]

## 2014-01-20 NOTE — Progress Notes (Signed)
Occupational Therapy Session Note  Patient Details  Name: Sarah [name] MRN: [id number] Date of Birth: 08-17-1965  Today's Date: 01/20/2014 OT Individual Time: 1400-1500 OT Individual Time Calculation (min): 60 min    Short Term Goals: Week 1:  OT Short Term Goal 1 (Week 1): Pt will complete upper body bathing with setup assist OT Short Term Goal 2 (Week 1): Pt will rise from supine to sitting at edge of bed with mod assist to manage LE OT Short Term Goal 3 (Week 1): Pt will maintain supported sitting balance at edge of bed for 5 min during assisted lower body dressing OT Short Term Goal 4 (Week 1): Pt will complete lateral leans to pull up pants with mod assist OT Short Term Goal 5 (Week 1): Pt will complete grooming at w/c level with setup assist  Skilled Therapeutic Interventions/Progress Updates:  Skilled OT session focusing on ADL retraining, bed mobility, pt education, UE strength and ROM, and activity tolerance/endurance. Pt supine in bed w/ RN and mother in room when arriving, reporting no pain. Pt educated on OT and back precautions. Pt completed UB/LB dressing while supine in bed, w/ rolling L and R. Pt t/f to w/c using maxi-move, pt reporting dizziness. Pt vitals taken, w/ O2 sat at 98%, pulse at 98 bpm, and BP at 125/83. Pt educated on and oriented to rehab floor, location of gym, apartment, family room, and day room, and taken to day room. Pt then participated in socialization activity in day room w/ TR and therapist. Pt seated in w/c w/ PT arriving when leaving.  Therapy Documentation Precautions:  Precautions Precautions: Fall;Back Precaution Comments: bil leg weakness, paraplegia Restrictions Weight Bearing Restrictions: No  See FIM for current functional status  Therapy/Group: Individual Therapy  Deano Tomaszewski Raynell 01/20/2014, 3:28 PM

## 2014-01-20 NOTE — Progress Notes (Signed)
Social Work  Social Work Assessment and Plan  Patient Details  Name: Sarah [name] MRN: [id number] Date of Birth: 16-May-1965  Today's Date: 01/20/2014  Problem List:  Patient Active Problem List   Diagnosis Date Noted  . Metastatic cancer to spine 01/19/2014  . Rotator cuff tendonitis   . Acute postoperative respiratory failure 01/09/2014  . 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 Spine metastasis 12/15/2013  . Leg weakness, bilateral 12/13/2013  . Intractable low back pain 12/10/2013  . Back pain 12/10/2013   Past Medical History:  Past Medical History  Diagnosis Date  . Hypertension   . DDD (degenerative disc disease), cervical   . DJD (degenerative joint disease)   . Obesity   . Chronic back pain   . 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 History of rectal fissure   . Rotator cuff tendonitis right   Past Surgical History:  Past Surgical History  Procedure Laterality Date  . Tonsillectomy    . Laminectomy N/A 12/14/2013    Procedure: THORACIC LAMINECTOMY FOR TUMOR THORACIC THREE;  Surgeon: Ashok Pall, MD;  Location: [facility] [facility];  Service: Neurosurgery;  Laterality: N/A;  THORACIC LAMINECTOMY FOR TUMOR THORACIC THREE  . Posterior lumbar fusion 4 level N/A 12/30/2013    Procedure: Thoracic one-Thoracic five posterior thoracic fusion with pedicle screws;  Surgeon: Ashok Pall, MD;  Location: [facility];  Service: Neurosurgery;  Laterality: N/A;  Thoracic one-Thoracic five posterior thoracic fusion with pedicle screws  . Thyroidectomy N/A 01/09/2014    Procedure: TOTAL THYROIDECTOMY;  Surgeon: Izora Gala, MD;  Location: [facility] OR;  Service: ENT;  Laterality: N/A;   Social History:  reports that she has never smoked. She has never used smokeless tobacco. She reports that she does not drink alcohol or use illicit drugs.  Family / Support Systems Marital Status: Single Patient  Roles: Other (Comment) (sister, daughter) Other Supports: mother, Sarah [name] @ [phone];  sister, Sarah [name] (and her husband, Sarah [name]) @ [phone];  sister, Sarah [name] [facility]) Anticipated Caregiver: Pt's [place] states she and pt's sisters will be primary care providers.  Dub Mikes works about 24 hours per week (but not currently working so she can be available to pt.)  [name] works a split shift of 6-8 am then 2-6 pm daily.  They believe they can arrange to be available 24/7 for pt. Other sister Sarah [name] lives in [place] but plans to help as much as she can.  she has been at the bedside with each of my visits to patient. Ability/Limitations of Caregiver: sister Sarah [name] on "disability" but independently ambulatory Caregiver Availability: 24/7 Family Dynamics: Pt. describes very supportive family and mother very attentive during interview.  With therapies, mother not reluctant to do hands on as needed.  Social History Preferred language: English Religion: Baptist Cultural Background: NA Education: HS Read: Yes Write: Yes Employment Status: Employed Name of Employer: [employer] Length of Employment: 2 (yrs.) Return to Work Plans: Pt very doubtful she will be able to return.  Has begun process for SSD and Medicaid. Legal Hisotry/Current Legal Issues: None Guardian/Conservator: None - per MD, pt capable of making decisions on her own behalf   Abuse/Neglect Physical Abuse: Denies Verbal Abuse: Denies Sexual Abuse: Denies Exploitation of patient/patient's resources: Denies Self-Neglect: Denies  Emotional Status Pt's affect, behavior adn adjustment status: Pt very pleasant talkative and speaks calmly about her new CA diagnosis.  She does express much frustration with her multiple ER visits and one very brief hospitalizations when she was told that "nothing was wrong".  She says she finds herself wondering if scans had been done when she first started presenting with  symptoms, "...would they maybe have been able to do surgery quicker before it got to the point that I couldn't walk."  She feels she is motivated for CIR tx.  Denies any s/s of depression or anxiety but open to neuropsych/ SW support if they begin to develop. Recent Psychosocial Issues: None Pyschiatric History: None Substance Abuse History: None  Patient / Family Perceptions, Expectations & Goals Pt/Family understanding of illness & functional limitations: pt and mother with good understanding of diagnosis, current treatments underway and of functional limitations/ need for CIR.  Both very open to all education that will be provided. Premorbid pt/family roles/activities: Pt was fully independent and working f/t. Anticipated changes in roles/activities/participation: Pt will likely require 24/7 assistance upon d/c.  Mother and sisters to assume primary caregiver roles. Pt/family expectations/goals: "I just hope I will see some good return."  [employer]: Other (Comment) (Has accessed [facility] ([place] in past for medical care) Premorbid Home Care/DME Agencies: None Transportation available at discharge: yes Resource referrals recommended: Neuropsychology;Support group (specify)  Discharge Planning Living Arrangements: Other relatives Support Systems: Parent;Other relatives Type of Residence: Private residence Insurance Resources: Teacher, adult education Resources: Employment Financial Screen Referred: Previously completed Living Expenses: Lives with family Money Management: Patient Does the patient have any problems obtaining your medications?: Yes (Describe) (no insurance) Home Management: shared among family Patient/Family Preliminary Plans: Pt plans to d/c home with sister and brother-in-law.  Mother and siblings to share in provision of 24/7 care. Barriers to Discharge: Steps;Finances Social Work Anticipated Follow Up Needs: HH/OP Expected length of  stay: 20-27 days  Clinical Impression Very unfortunate woman here following discovery of spinal tumor (mets/ thyroid CA) and facing CA diagnosis following surgery.  Paraplegia and size will be limiting factors for d/c.  Excellent family support and pt appears to be coping well/ as expected with diagnosis.  Open to support and education and eager to "make the most" of CIR.  Will follow for support and d/c planning.  Tal Neer 01/20/2014, 2:46 PM

## 2014-01-20 NOTE — Care Management Note (Signed)
[facility] Individual Statement of Services  Patient Name:  Sarah [name]  Date:  01/20/2014  Welcome to the [facility].  Our goal is to provide you with an individualized program based on your diagnosis and situation, designed to meet your specific needs.  With this comprehensive rehabilitation program, you will be expected to participate in at least 3 hours of rehabilitation therapies Monday-Friday, with modified therapy programming on the weekends.  Your rehabilitation program will include the following services:  Physical Therapy (PT), Occupational Therapy (OT), 24 hour per day rehabilitation nursing, Therapeutic Recreaction (TR), Neuropsychology, Case Management (Education officer, museum), Rehabilitation Medicine, Nutrition Services, Geographical information systems officer and Radiation Onc.  Weekly team conferences will be held on Tuesdays to discuss your progress.  Your Social Worker will talk with you frequently to get your input and to update you on team discussions.  Team conferences with you and your family in attendance may also be held.  Expected length of stay: 20-27 days               Overall anticipated outcome: minimal assistance  Depending on your progress and recovery, your program may change. Your Social Worker will coordinate services and will keep you informed of any changes. Your Social Worker's name and contact numbers are listed  below.  The following services may also be recommended but are not provided by the [facility] will be made to provide these services after discharge if needed.  Arrangements include referral to agencies that provide these services.  Your insurance has been verified to be:  Medicaid application pending Your primary doctor is:  None (Social Work will assist in establishing  a primary care provider)  Pertinent information will be shared with your doctor and your insurance company.  Social Worker:  [facility], [place] or ([phone]   Information discussed with and copy given to patient by: Lennart Pall, 01/20/2014, 1:49 PM

## 2014-01-20 NOTE — Progress Notes (Signed)
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 Metastatic cancer to spine 01/19/2014  . Rotator cuff tendonitis   . Acute postoperative respiratory failure 01/09/2014  . 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 Spine metastasis 12/15/2013  . Leg weakness, bilateral 12/13/2013  . Intractable low back pain 12/10/2013  . Back pain 12/10/2013    Past Medical History:  Past Medical History  Diagnosis Date  . Hypertension   . DDD (degenerative disc disease), cervical   . DJD (degenerative joint disease)   . Obesity   . Chronic back pain   . 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 History of rectal fissure   . Rotator cuff tendonitis right   Past Surgical History:  Past Surgical History  Procedure Laterality Date  . Tonsillectomy    . Laminectomy N/A 12/14/2013    Procedure: THORACIC LAMINECTOMY FOR TUMOR THORACIC THREE;  Surgeon: Ashok Pall, MD;  Location: [place];  Service: Neurosurgery;  Laterality: N/A;  THORACIC LAMINECTOMY FOR TUMOR THORACIC THREE  . Posterior lumbar fusion 4 level N/A 12/30/2013    Procedure: Thoracic one-Thoracic five posterior thoracic fusion with pedicle screws;  Surgeon: Ashok Pall, MD;  Location: [place] [facility];  Service: Neurosurgery;  Laterality: N/A;  Thoracic one-Thoracic five posterior thoracic fusion with pedicle screws  . Thyroidectomy N/A 01/09/2014    Procedure: TOTAL THYROIDECTOMY;  Surgeon: Izora Gala, MD;  Location: [facility] OR;  Service: ENT;  Laterality: N/A;    Assessment & Plan Clinical Impression:  [name] a 48 y.o. year old female with history of HTN, chronic back pain, morbid obesity admitted on 12/10/13 with BLE weakness with inability to walk as well as problems with urinary retention and constipation. Work up revealed a lesion at the T3 level of the upper thoracic spine with pathologic compression fracture and epidural tumor resulting in moderate cord compression with evidence of mild spinal cord edema, as well as right suprahilar mass with pulmonary nodules with mediastinal lymph nodes c/w metastatic disease, RUL & RLL PE, tubular cystic masses bilateral adnexal regions and a massive thyroid goiter causing tracheal deviation to the right with narrowing of trachea. Started on steriods and foley placed for urinary retention. Underwent thoracic laminectomy with excision of tumor on 09/23 by Dr. Christella Noa and on bedrest pending further surgery. Pathology favoring metastatic adenocarcinoma of lung but metastatic thyroid cancer in differential . 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 Buddy Duty consulted for input. 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 Patient underwent T1-T5 posterolateral arthrodesis with allograft on 10/09 with plans XRT to be initiated 2 weeks post op. Dr. Constance Holster consulted for input and patient underwent total thyroidectomy on 10/19 and extubated without difficulty on 10/20. 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 She was changed back to Lovenox as  platelets have stabilized. Dr. Hosie Poisson recommends transition to Xarelto once all surgeries completed.   She continues to have paraparesis and therapy working on bed mobility as well as sitting balance at edge of bed.  Recurrent UTI treated with Keflex. Foley remains in place and bowel incontinence reported as well as constipation issues reported. Dr. [name] consulted for XRT to spine for pain palliation as well as prevention of further spinal cord injury. Had simulation on 10/27--10 treatments started on 01/18/14. Right hilar mass without clear pathologic diagnosis and patient to follow up with Dr. Lamonte Sakai in 1-2 months on outpatient basis. Dr. Earlie Server to follow for input on chemotherapy on metastatic Hurtle cell cancer?    Patient transferred to CIR on 01/19/2014 .    Patient currently requires max with basic self-care skills secondary to muscle weakness and unbalanced muscle activation.  Prior to hospitalization, patient could complete BADL/iADL independently.   Patient will benefit from skilled intervention to increase independence with basic self-care skills prior to discharge home with care partner.  Anticipate patient will require minimal physical assistance and follow up home health.  OT - End of Session Activity Tolerance: Tolerates 10 - 20 min activity with multiple rests Endurance Deficit: Yes Endurance Deficit Description: muscular and cardiorespiratory OT Assessment Rehab Potential: Good OT Patient demonstrates impairments in the following area(s): Balance;Endurance;Motor;Skin Integrity;Safety;Sensory OT Basic ADL's Functional Problem(s): Grooming;Bathing;Dressing;Toileting OT Advanced ADL's Functional Problem(s): Simple Meal Preparation OT Transfers Functional Problem(s): Toilet;Tub/Shower OT Plan OT Intensity: Minimum of 1-2 x/day, 45 to 90 minutes OT Frequency: 5 out of 7 days OT Duration/Estimated Length of Stay: 3 weeks OT Treatment/Interventions: Balance/vestibular training;Functional mobility training;Patient/family education;Therapeutic Exercise;Therapeutic Activities;Self Care/advanced ADL retraining;Wheelchair propulsion/positioning;Neuromuscular re-education;DME/adaptive equipment  instruction;Community reintegration;Discharge planning OT Self Feeding Anticipated Outcome(s): Mod I OT Basic Self-Care Anticipated Outcome(s): Min A OT Toileting Anticipated Outcome(s): Min A OT Bathroom Transfers Anticipated Outcome(s): Min A OT Recommendation Patient destination: Home Follow Up Recommendations: Home health OT Equipment Recommended: To be determined   Skilled Therapeutic Intervention OT 1:1 initial evaluation with treatment provided to focus on adapted bathing, bed mobility, toileting, and goals and objectives of treatment, and initial education of back precautions.   Pt received supine in bed, RN attending to meds, and pt reporting awareness of need for BM.   Pt was unable RN provided setup with bed pan as OT assessed bed mobility and provided setup for supine bathing.   Pt required extra time to void BM and max assist with bed mobility to position both legs and guide hand placement.   Pt bathed upper body but required total assist to wash lower body and periarea (RN providing assist).   Pt left in bed at end of session with RN returning to apply dressing change.   OT Evaluation Precautions/Restrictions  Precautions Precautions: Fall;Back Precaution Comments: bil leg weakness, paraplegia Restrictions Weight Bearing Restrictions: No  General Chart Reviewed: Yes Family/Caregiver Present: No  Vital Signs Therapy Vitals Temp: 98.5 F (36.9 C) Temp Source: Oral Pulse Rate: 94 Resp: 18 BP: 117/92 mmHg Patient Position (if appropriate): Lying Oxygen Therapy SpO2: 97 % O2 Device: Not Delivered  Pain Pain Assessment Pain Assessment: 0-10 Pain Score: 2  Pain Type: Surgical pain Pain Location: Back Pain Orientation: Mid Pain Descriptors / Indicators: Aching Pain Onset: On-going Pain Intervention(s): Emotional support;Repositioned Multiple Pain Sites: No  Home Living/Prior Functioning Home Living Family/patient expects to be discharged to:: Private  residence Living Arrangements: Spouse/significant other Available Help at Discharge: Family;Available 24 hours/day Type of Home: [place]  Access: Stairs to enter [employer] of Steps: 2 (deep enough for all 4 legs of RW) Entrance Stairs-Rails: None Home Layout: One level  Lives With: Family IADL History Homemaking Responsibilities: Yes Meal Prep Responsibility: Primary Laundry Responsibility: Primary Cleaning Responsibility: Primary Bill Paying/Finance Responsibility: Primary Shopping Responsibility: Primary Child Care Responsibility: No Current License: Yes Mode of Transportation: Car Education: HS + some college Occupation: Full time employment Type of Occupation: Actuary at [employer] Level of Independence: Independent with basic ADLs;Independent with homemaking with ambulation;Independent with gait;Independent with transfers  Able to Take Stairs?: Yes Driving: Yes Vocation: Full time employment Leisure: Hobbies-yes (Comment) Comments: family outings  ADL ADL ADL Comments: see FIM  Vision/Perception  Vision- History Baseline Vision/History: Wears glasses Wears Glasses: Reading only Patient Visual Report: No change from baseline Vision- Assessment Vision Assessment?: No apparent visual deficits   Cognition Overall Cognitive Status: Within Functional Limits for tasks assessed Arousal/Alertness: Awake/alert Orientation Level: Oriented X4 Attention: Focused;Sustained Focused Attention: Appears intact Sustained Attention: Appears intact Alternating Attention: Appears intact Memory: Appears intact Awareness: Appears intact Problem Solving: Appears intact Safety/Judgment: Appears intact  Sensation Sensation Light Touch: Appears Intact Stereognosis: Appears Intact Hot/Cold: Appears Intact Proprioception: Appears Intact Coordination Gross Motor Movements are Fluid and Coordinated: Yes Fine Motor Movements are Fluid and  Coordinated: Yes  Motor  Motor Motor: Paraplegia;Abnormal tone Motor - Skilled Clinical Observations: R ankle clonus x 3 beats with quick stretch to achilles  Mobility  Bed Mobility Bed Mobility: Rolling Right;Rolling Left Rolling Right: 3: Mod assist;With rail Rolling Right Details: Manual facilitation for placement Rolling Left: 3: Mod assist;With rail Rolling Left Details: Manual facilitation for placement Supine to Sit: 1: +2 Total assist Supine to Sit Details: Manual facilitation for placement;Verbal cues for technique Supine to Sit Details (indicate cue type and reason): hand placement Sit to Supine: 1: +2 Total assist Sit to Supine - Details: Verbal cues for technique;Manual facilitation for placement   Trunk/Postural Assessment  Cervical Assessment Cervical Assessment: Within Functional Limits Thoracic Assessment Thoracic Assessment: Exceptions to [facility] Thoracic AROM Overall Thoracic AROM: Deficits;Due to precautions Overall Thoracic AROM Comments: spinal precautions Lumbar Assessment Lumbar Assessment: Within Functional Limits Postural Control Postural Control: Deficits on evaluation Head Control: head forward and down at edge of bed Trunk Control: relies heavily on bedrail edge of bed and external support, progressing to CGA Righting Reactions: slowed Protective Responses: slowed Postural Limitations: poor trunk control edge of bed (unstable surface)   Balance Balance Balance Assessed: Yes Static Sitting Balance Static Sitting - Balance Support: Bilateral upper extremity supported;Feet supported Static Sitting - Level of Assistance: 3: Mod assist (pt progresses to CGA with practice, while holding L bedrail) Dynamic Sitting Balance Dynamic Sitting - Balance Support: Bilateral upper extremity supported;During functional activity;Feet supported Dynamic Sitting - Level of Assistance: 1: +2 Total assist Dynamic Sitting - Balance Activities: Lateral lean/weight  shifting;Trunk control activities  Extremity/Trunk Assessment RUE Assessment RUE Assessment: Within Functional Limits LUE Assessment LUE Assessment: Within Functional Limits  FIM:  FIM - Eating Eating Activity: 7: Complete independence:no helper FIM - Grooming Grooming Steps: Wash, rinse, dry face;Wash, rinse, dry hands Grooming: 5: Set-up assist to obtain items FIM - Bathing Bathing Steps Patient Completed: Chest;Right Arm;Left Arm;Abdomen Bathing: 2: Max-Patient completes 3-4 21f10 parts or 25-49% FIM - Toileting Toileting: 1: Total-Patient completed zero steps, helper did all 3   Refer to Care Plan for Long Term Goals  Recommendations for other services: None  Discharge Criteria: Patient will be discharged  from OT if patient refuses treatment 3 consecutive times without medical reason, if treatment goals not met, if there is a change in medical status, if patient makes no progress towards goals or if patient is discharged from hospital.  The above assessment, treatment plan, treatment alternatives and goals were discussed and mutually agreed upon: by patient  [facility] 01/20/2014, 1:02 PM

## 2014-01-20 NOTE — Progress Notes (Signed)
Note reviewed and accurately reflects treatment session.   

## 2014-01-20 NOTE — IPOC Note (Addendum)
Overall Plan of Care [facility]) Patient Details Name: Sarah [name] MRN: [id number] DOB: [birth year]  Admitting Diagnosis: t3 incomplete para who receives radioation daily   Hospital Problems: Active Problems:   Metastatic cancer to spine   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     Functional Problem List: Nursing Bladder;Bowel;Edema;Endurance;Motor;Skin Integrity;Sensory;Safety  PT Balance;Behavior;Edema;Endurance;Motor;Pain;Sensory;Safety  OT Balance;Endurance;Motor;Skin Integrity;Safety;Sensory  SLP    TR Activity tolerance, functional mobility, balance, safety, pain       Basic ADL's: OT Grooming;Bathing;Dressing;Toileting     Advanced  ADL's: OT Simple Meal Preparation     Transfers: PT Bed Mobility;Bed to Chair;Car;Furniture  OT Toilet;Tub/Shower     Locomotion: PT Ambulation;Wheelchair Mobility;Stairs     Additional Impairments: OT    SLP        TR      Anticipated Outcomes Item Anticipated Outcome  Self Feeding Mod I  Swallowing      Basic self-care  Min A  Toileting  Min A   Bathroom Transfers Min A  Bowel/Bladder  continent of bowel and bladder  Transfers  min A  Locomotion  mod I w/c   Communication     Cognition     Pain  pain less than 3  Safety/Judgment  will adhere to safety plan   Therapy Plan: PT Intensity: Minimum of 1-2 x/day ,45 to 90 minutes PT Frequency: 5 out of 7 days PT Duration Estimated Length of Stay: 3 weeks OT Intensity: Minimum of 1-2 x/day, 45 to 90 minutes OT Frequency: 5 out of 7 days OT Duration/Estimated Length of Stay: 3 weeks  TR Duration/ELOS:  3 weeks TR Frequency:  Min 1 time per week >20 minutes           Team Interventions: Nursing Interventions Patient/Family Education;Disease Management/Prevention;Skin Care/Wound Management;Discharge Planning;Bladder Management;Pain Management;Psychosocial Support;Bowel Management;Medication Management  PT interventions Ambulation/gait training;Discharge planning;Functional  mobility training;Psychosocial support;Therapeutic Activities;Balance/vestibular training;Disease management/prevention;Neuromuscular re-education;Therapeutic Exercise;Wheelchair propulsion/positioning;DME/adaptive equipment instruction;Pain management;Splinting/orthotics;UE/LE Strength taining/ROM;Community reintegration;Functional electrical stimulation;Patient/family education;Stair training;UE/LE Coordination activities  OT Interventions Balance/vestibular training;Functional mobility training;Patient/family education;Therapeutic Exercise;Therapeutic Activities;Self Care/advanced ADL retraining;Wheelchair propulsion/positioning;Neuromuscular re-education;DME/adaptive equipment instruction;Community reintegration;Discharge planning  SLP Interventions    TR Interventions  Recreation/leisure participation, Balance/Vestibular training, functional mobility, therapeutic activities, UE/LE strength/coordination, w/c mobility, community reintegration, pt/family education, adaptive equipment instruction/use, discharge planning, psychosocial support   SW/CM Interventions Psychosocial Support;Patient/Family Education;Discharge Planning    Team Discharge Planning: Destination: PT-Home ,OT- Home , SLP-  Projected Follow-up: PT-24 hour supervision/assistance;Home health PT, OT-  Home health OT, SLP-  Projected Equipment Needs: PT-To be determined;Wheelchair (measurements);Wheelchair cushion (measurements), OT- To be determined, SLP-  Equipment Details: PT- , OT-  Patient/family involved in discharge planning: PT- Patient,  OT-Patient, SLP-   MD ELOS: 18-22D    Medical Rehab Prognosis:  Good Assessment: 48 y.o. female with history of HTN, chronic back pain, morbid obesity who was admitted on 12/10/13 with BLE weakness, inability to walk as well as problems with urinary retention and constipation. Work up revealed a lesion at the T3 level of the upper thoracic spine with pathologic compression fracture and  epidural tumor resulting in moderate cord compression with evidence of mild spinal cord edema, as well as right suprahilar mass with pulmonary nodules with mediastinal lymph nodes c/w metastatic disease, RUL & RLL PE, tubular cystic masses bilateral adnexal regions and a massive thyroid goiter causing tracheal deviation to the right with narrowing of trachea. She was started on steriods and foley placed for urinary retention. She underwent thoracic laminectomy with excision of tumor on 09/23 by Dr.  Cabbell and on bedrest pending further surgery. Pathology favoring metastatic adenocarcinoma of lung but metastatic thyroid cancer in differential . 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 Buddy Duty consulted for input. 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 Patient underwent T1-T5 posterolateral arthrodesis with allograft on 10/09 with plans XRT to be initiated 2 weeks post op. Dr. Constance Holster consulted for input and patient underwent total thyroidectomy on 10/19 and extubated without difficulty on 10/20. 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 She was changed back to Lovenox as platelets have stabilized. Dr. Hosie Poisson recommends transition to Xarelto once all surgeries completed.   She continues to have paraparesis and therapy working on bed mobility as well as sitting balance at edge of bed. Recurrent UTI treated with Keflex. Foley remains in place and bowel incontinence reported as well as constipation issues reported. Dr. [name] consulted for XRT to spine for pain palliation as well as prevention of further spinal cord injury  Now requiring 24/7 Rehab RN,MD, as well as CIR level PT, OT and SLP.   Treatment team will focus on ADLs and mobility with goals set at [place] level   See Team Conference Notes for weekly updates to the plan of care

## 2014-01-20 NOTE — Discharge Summary (Signed)
FMTS Attending Note Patient seen and examined by me on the day of discharge, discussed with resident team and I agree with plan of discharge.  Dalbert Mayotte, MD

## 2014-01-20 NOTE — Progress Notes (Signed)
48 y.o. female with history of HTN, chronic back pain, morbid obesity who was admitted on 12/10/13 with BLE weakness, inability to walk as well as problems with urinary retention and constipation. Work up revealed a lesion at the T3 level of the upper thoracic spine with pathologic compression fracture and epidural tumor resulting in moderate cord compression with evidence of mild spinal cord edema, as well as right suprahilar mass with pulmonary nodules with mediastinal lymph nodes c/w metastatic disease, RUL & RLL PE, tubular cystic masses bilateral adnexal regions and a massive thyroid goiter causing tracheal deviation to the right with narrowing of trachea. She was started on steriods and foley placed for urinary retention. She underwent thoracic laminectomy with excision of tumor on 09/23 by Dr. Christella Noa and on bedrest pending further surgery. Pathology favoring metastatic adenocarcinoma of lung but metastatic thyroid cancer in differential . 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 Buddy Duty consulted for input. 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 Patient underwent T1-T5 posterolateral arthrodesis with allograft on 10/09 with plans XRT to be initiated 2 weeks post op. Dr. Constance Holster consulted for input and patient underwent total thyroidectomy on 10/19 and extubated without difficulty on 10/20.  Subjective/Complaints: No pain c/os Incont of bowel , still has foley secondary to heavy transfers  Objective: Vital Signs: Blood pressure 125/83, pulse 96, temperature 98.1 F (36.7 C), temperature source Oral, resp. rate 20, height _0  (1.803 m), weight 136 kg (299 lb 13.2 oz), last menstrual period 12/06/2013, SpO2 99.00%. Dg Abd 1 View  01/19/2014   CLINICAL DATA:  Constipation  EXAM: ABDOMEN - 1 VIEW  COMPARISON:  CT abdomen pelvis dated 12/14/2013  FINDINGS: Nonobstructive bowel gas pattern.   Normal colonic stool burden.  Degenerative changes of the lumbar spine.  IMPRESSION: Unremarkable abdominal radiograph.   Electronically Signed   By: Julian Hy M.D.   On: 01/19/2014 20:30   Results for orders placed during the hospital encounter of 01/19/14 (from the past 72 hour(s))  GLUCOSE, CAPILLARY     Status: Abnormal   Collection Time    01/19/14  9:09 PM      Result Value Ref Range   Glucose-Capillary 118 (*) 70 - 99 mg/dL   Comment 1 Notify RN    CBC WITH DIFFERENTIAL     Status: Abnormal   Collection Time    01/20/14  3:40 AM      Result Value Ref Range   WBC 4.9  4.0 - 10.5 K/uL   RBC 3.31 (*) 3.87 - 5.11 MIL/uL   Hemoglobin 9.2 (*) 12.0 - 15.0 g/dL   HCT 28.4 (*) 36.0 - 46.0 %   MCV 85.8  78.0 - 100.0 fL   MCH 27.8  26.0 - 34.0 pg   MCHC 32.4  30.0 - 36.0 g/dL   RDW 15.2  11.5 - 15.5 %   Platelets 343  150 - 400 K/uL   Neutrophils Relative % 48  43 - 77 %   Neutro Abs 2.4  1.7 - 7.7 K/uL   Lymphocytes Relative 39  12 - 46 %   Lymphs Abs 1.9  0.7 - 4.0 K/uL   Monocytes Relative 11  3 - 12 %   Monocytes Absolute 0.5  0.1 - 1.0 K/uL   Eosinophils Relative 2  0 - 5 %   Eosinophils Absolute 0.1  0.0 - 0.7 K/uL   Basophils Relative 0  0 - 1 %   Basophils Absolute 0.0  0.0 - 0.1 K/uL  COMPREHENSIVE METABOLIC PANEL     Status: Abnormal   Collection Time    01/20/14  3:40 AM      Result Value Ref Range   Sodium 138  137 - 147 mEq/L   Potassium 3.4 (*) 3.7 - 5.3 mEq/L   Chloride 97  96 - 112 mEq/L   CO2 27  19 - 32 mEq/L   Glucose, Bld 99  70 - 99 mg/dL   BUN 9  6 - 23 mg/dL   Creatinine, Ser 0.69  0.50 - 1.10 mg/dL   Calcium 8.4  8.4 - 10.5 mg/dL   Total Protein 5.9 (*) 6.0 - 8.3 g/dL   Albumin 2.5 (*) 3.5 - 5.2 g/dL   AST 16  0 - 37 U/L   ALT 10  0 - 35 U/L   Alkaline Phosphatase 126 (*) 39 - 117 U/L   Total Bilirubin 0.5  0.3 - 1.2 mg/dL   GFR calc non Af Amer >90  >90 mL/min   GFR calc Af Amer >90  >90 mL/min   Comment: (NOTE)     The eGFR has been  calculated using the CKD EPI equation.     This calculation has not been validated in all clinical situations.     eGFR's persistently <90 mL/min signify possible Chronic Kidney     Disease.   Anion gap 14  5 - 15  GLUCOSE, CAPILLARY     Status: None   Collection Time    01/20/14  6:25 AM      Result Value Ref Range   Glucose-Capillary 96  70 - 99 mg/dL  GLUCOSE, CAPILLARY     Status: Abnormal   Collection Time    01/20/14 11:14 AM      Result Value Ref Range   Glucose-Capillary 131 (*) 70 - 99 mg/dL   Comment 1 Notify RN        Nursing note and vitals reviewed.  Constitutional: She is oriented to person, place, and time. She appears well-developed and well-nourished.  HENT:  Head: Normocephalic and atraumatic.  Eyes: Conjunctivae are normal. Pupils are equal, round, and reactive to light.  Neck: Normal range of motion.  Cardiovascular: Normal rate and regular rhythm.  No murmur heard.  Respiratory: Effort normal and breath sounds normal. No respiratory distress. She has no wheezes. She exhibits no tenderness (sternal area with radiation markings.).  GI: Soft. Bowel sounds are normal. She exhibits no distension. There is no tenderness.  Musculoskeletal: She exhibits no edema and no tenderness.  Neurological: She is alert and oriented to person, place, and time.  Speech clear. Follows commands without difficulty. Dense paraparesis with flexor spasms and few beats clonus on the right. Diminished sensation 1/2 below Xiphoid.pain from gross touch.  Skin: Skin is warm and dry.  Intergluteal area chapped and abraded due to MAST. Liquid stool on underpad as well as rectal area. Inflamed skin tags with tenderness. Large foam dressing on sacrum.  Psychiatric: She has a normal mood and affect. Her behavior is normal. Judgment and thought content normal.  Motor strength is 5/5 bilateral deltoids, biceps, triceps, grip  0/5 bilateral hip flexors knee extensors ankle dorsiflexor plantar  flexor   Assessment/Plan: 1. Functional deficits secondary to Paraplegia which require 3+ hours per day of interdisciplinary therapy in a comprehensive inpatient rehab setting. Physiatrist is providing close team supervision and 24 hour management of active medical problems  listed below. Physiatrist and rehab team continue to assess barriers to discharge/monitor patient progress toward functional and medical goals. FIM: FIM - Bathing Bathing Steps Patient Completed: Chest;Right Arm;Left Arm;Abdomen Bathing: 2: Max-Patient completes 3-4 [id number] parts or 25-49%  FIM - Upper Body Dressing/Undressing Upper body dressing/undressing steps patient completed: Thread/unthread right sleeve of pullover shirt/dresss;Thread/unthread left sleeve of pullover shirt/dress;Put head through opening of pull over shirt/dress Upper body dressing/undressing: 4: Min-Patient completed 75 plus % of tasks FIM - Lower Body Dressing/Undressing Lower body dressing/undressing: 1: Two helpers  FIM - Toileting Toileting: 1: Total-Patient completed zero steps, helper did all 3           Comprehension Comprehension Mode: Auditory Comprehension: 6-Follows complex conversation/direction: With extra time/assistive device  Expression Expression Mode: Verbal Expression: 6-Expresses complex ideas: With extra time/assistive device  Social Interaction Social Interaction: 5-Interacts appropriately 90% of the time - Needs monitoring or encouragement for participation or interaction.  Problem Solving Problem Solving: 5-Solves basic 90% of the time/requires cueing < 10% of the time  Memory Memory: 6-More than reasonable amt of time  Medical Problem List and Plan:  1. 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  2. DVT Prophylaxis/Anticoagulation: Pharmaceutical: Lovenox-will change to Xarelto per Hem/Onc input.  3. Pain Management:  Denies back pain. But will need medication to help manage spacticity.  4. Mood: Reports problems sleeping due to current situation and prn ativan ineffective. She is willing to start low dose antidepressant. Will have chaplin follow for spiritual support as well as LCSW to follow for evaluation and support.  5. Neuropsych: This patient is capable of making decisions on her own behalf.  6. Skin/Wound Care: Air mattress overlay for prevention of break down. Start educating patient and family on need for pressure relief measures.  7. Fluids/Electrolytes/Nutrition: Monitor I/O. Offer supplements if intake poor. Routine check of lytes.  8. Neurogenic bladder: Will change out foley today--continue for now.  9. Neurogenic bowel: Will check KUB for baseline and start on bowel program.  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 For RAI at 6-8 weeks post op.  11. HTN: Will monitor every 8 hours. No medications as this time. Monitor for orthostatic changes once OOB.  12 PCOS? With hyperglycemia: Hgb A1c-6.7. Will modify diet to CM and have RD educate patient on restrictions.  13. E coli UTI--recurrent: continue Keflex D # 7/7.  14. Hemorrhoids: continue proctofoam cream bid in addition to good rectal hygiene to prevent irritation from stool.    LOS (Days) 1 A FACE TO FACE EVALUATION WAS PERFORMED  Elidia Bonenfant E 01/20/2014, 4:23 PM

## 2014-01-20 NOTE — Progress Notes (Signed)
Initiated Xarelto medication education and provided the patient with medication pamphlet packet from pharmacy.  Oval Linsey, RN

## 2014-01-20 NOTE — Progress Notes (Signed)
[facility] Therapy Treatment Record Phone [phone]   Radiation Therapy was administered to Sarah [name] on: 01/20/2014  3:42 PM and was treatment # 2 out of a planned course of 9 treatments.

## 2014-01-20 NOTE — Progress Notes (Signed)
Patient information reviewed and entered into eRehab system by Forever Arechiga, RN, CRRN, PPS Coordinator.  Information including medical coding and functional independence measure will be reviewed and updated through discharge.    

## 2014-01-20 NOTE — Progress Notes (Signed)
  Radiation Oncology         [phone] ________________________________  Name: Sarah [name]  MRN: [id number]  Date: 01/20/2014  DOB: [birth year]      INPATIENT Weekly Radiation Therapy Management    ICD-9-CM ICD-10-CM  1. Spine metastasis 198.5 C79.51    Current Dose: 6 Gy     Planned Dose:  30 Gy  Narrative . . . . . . . . The patient presents for routine under treatment assessment.                                   The patient is without complaint.                                 Set-up films were reviewed.                                 The chart was checked. Physical Findings. . . .   No significant changes. Impression . . . . . . . The patient is tolerating radiation. Plan . . . . . . . . . . . . Continue treatment as planned.  ________________________________  [name]. [name], M.D.

## 2014-01-21 ENCOUNTER — Inpatient Hospital Stay (HOSPITAL_COMMUNITY): Payer: Medicaid Other | Admitting: Occupational Therapy

## 2014-01-21 ENCOUNTER — Inpatient Hospital Stay (HOSPITAL_COMMUNITY): Payer: Medicaid Other | Admitting: Physical Therapy

## 2014-01-21 DIAGNOSIS — C73 Malignant neoplasm of thyroid gland: Secondary | ICD-10-CM

## 2014-01-21 LAB — GLUCOSE, CAPILLARY
GLUCOSE-CAPILLARY: 114 mg/dL — AB (ref 70–99)
Glucose-Capillary: 124 mg/dL — ABNORMAL HIGH (ref 70–99)
Glucose-Capillary: 117 mg/dL — ABNORMAL HIGH (ref 70–99)
Glucose-Capillary: 115 mg/dL — ABNORMAL HIGH (ref 70–99)

## 2014-01-21 MED ORDER — BISACODYL 10 MG RE SUPP
10.0000 mg | Freq: Every day | RECTAL | Status: DC
  Administered 2014-01-22: 10 mg via RECTAL
  Filled 2014-01-21: qty 1

## 2014-01-21 NOTE — Discharge Instructions (Addendum)
Inpatient Rehab Discharge Instructions  [facility] Discharge date and time:  02/10/14  Activities/Precautions/ Functional Status: Activity: activity as tolerated Diet: diabetic diet Wound Care: none needed   Functional status:  ___ No restrictions     ___ Walk up steps independently _X__ 24/7 supervision/assistance   ___ Walk up steps with assistance ___ Intermittent supervision/assistance  ___ Bathe/dress independently ___ Walk with walker     _X__ Bathe/dress with assistance ___ Walk Independently    ___ Shower independently ___ Walk with assistance    ___ Shower with assistance _X__ No alcohol     ___ Return to work/school ________   COMMUNITY REFERRALS UPON DISCHARGE:    Home Health:   PT        RN                    Agency: [facility] Phone: [phone]   Medical Equipment/Items Ordered: wheelchair and cushion via [employer] @ [phone]                                                                 Hospital bed and hoyer lift via [place] [facility] [place] PATIENT/FAMILY:  Support Groups: available via [place] (handout provided)    Caregiver Support: same   Home Modification: [place] @ [phone] (handout)  Medicaid Transportation @ [phone] ext [phone] - request 3 - 5 day notice    Special Instructions: 1. Increase miralax to twice a day if you don't have a bowel movement in 48 hours.  And follow with fleets enema instead of suppository that evening.  2. Drink room temperature liquids/supplements 4-5 times a day till able to eat without difficulty. You need to drink sips thorough out the day if you can't drink 6-8 oz at a time.    My questions have been answered and I understand these instructions. I will adhere to these goals and the provided educational materials after my discharge from the hospital.  Patient/Caregiver Signature _______________________________ Date __________  Clinician  Signature _______________________________________ Date __________  Please bring this form and your medication list with you to all your follow-up doctor's appointments.   Information on my medicine - XARELTO (rivaroxaban)  This medication education was reviewed with me or my healthcare representative as part of my discharge preparation.   WHY WAS XARELTO PRESCRIBED FOR YOU? Xarelto was prescribed to treat blood clots that may have been found in the veins of your legs (deep vein thrombosis) or in your lungs (pulmonary embolism) and to reduce the risk of them occurring again.  What do you need to know about Xarelto? The starting dose is one 15 mg tablet taken TWICE daily with food for the FIRST 21 DAYS then on (enter date)  11/20  the dose is changed to one 20 mg tablet taken ONCE A DAY with your evening meal.  DO NOT stop taking Xarelto without talking to the health care provider who prescribed the medication.  Refill your prescription for 20 mg tablets before you run out.  After discharge, you should have regular check-up appointments with your healthcare provider that is prescribing your Xarelto.  In the future your dose may need to be changed if your kidney function changes by  a significant amount.  What do you do if you miss a dose? If you are taking Xarelto TWICE DAILY and you miss a dose, take it as soon as you remember. You may take two 15 mg tablets (total 30 mg) at the same time then resume your regularly scheduled 15 mg twice daily the next day.  If you are taking Xarelto ONCE DAILY and you miss a dose, take it as soon as you remember on the same day then continue your regularly scheduled once daily regimen the next day. Do not take two doses of Xarelto at the same time.   Important Safety Information Xarelto is a blood thinner medicine that can cause bleeding. You should call your healthcare provider right away if you experience any of the following: ? Bleeding from an  injury or your nose that does not stop. ? Unusual colored urine (red or dark brown) or unusual colored stools (red or black). ? Unusual bruising for unknown reasons. ? A serious fall or if you hit your head (even if there is no bleeding).  Some medicines may interact with Xarelto and might increase your risk of bleeding while on Xarelto. To help avoid this, consult your healthcare provider or pharmacist prior to using any new prescription or non-prescription medications, including herbals, vitamins, non-steroidal anti-inflammatory drugs (NSAIDs) and supplements.  This website has more information on Xarelto: [email or web address]-benson.[email or web address]

## 2014-01-21 NOTE — Progress Notes (Signed)
Chaplain followed up with pt. Pt mother and father present. Pt told chaplain about rehab and radiation thus far. Pt commented that rehab was difficult, but rewarding. Chaplain will continue to follow.  01/21/14 1700  Clinical Encounter Type  Visited With Patient;Patient and family together  Visit Type Follow-up;Spiritual support  Referral From Nurse  Spiritual Encounters  Spiritual Needs Emotional  Stress Factors  Patient Stress Factors Major life changes;Health changes;Exhausted  Sarah [name], Barbette Hair, Chaplain 01/21/2014 5:16 PM

## 2014-01-21 NOTE — Progress Notes (Addendum)
Sarah [name] is a 48 y.o. female 1966-03-11 [id number]  Subjective: No new complaints. Pt would like to have a supp at night (not in am). Slept well. Feeling OK.  Objective: Vital signs in last 24 hours: Temp:  [98.1 F (36.7 C)-98.2 F (36.8 C)] 98.2 F (36.8 C) (10/31 0543) Pulse Rate:  [95-96] 95 (10/31 0543) Resp:  [18-20] 18 (10/31 0543) BP: (120-125)/(70-83) 120/70 mmHg (10/31 0543) SpO2:  [97 %-99 %] 97 % (10/31 0543) Weight change:  Last BM Date: 01/20/14  Intake/Output from previous day: 10/30 0701 - 10/31 0700 In: 480 [P.O.:480] Out: 1700 [Urine:1700] Last cbgs: CBG (last 3)   Recent Labs  01/20/14 1727 01/20/14 2222 01/21/14 0717  GLUCAP 153* 91 124*     Physical Exam General: No apparent distress  Obese HEENT: not dry Lungs: Normal effort. Lungs clear to auscultation, no crackles or wheezes. Cardiovascular: Regular rate and rhythm, no edema Abdomen: S/NT/ND; BS(+) Musculoskeletal:  unchanged Neurological: No new neurological deficits Wounds: N/A    Skin: clear   Mental state: Alert, oriented, cooperative    Lab Results: BMET    Component Value Date/Time   NA 138 01/20/2014 0340   K 3.4* 01/20/2014 0340   CL 97 01/20/2014 0340   CO2 27 01/20/2014 0340   GLUCOSE 99 01/20/2014 0340   BUN 9 01/20/2014 0340   CREATININE 0.69 01/20/2014 0340   CALCIUM 8.4 01/20/2014 0340   GFRNONAA >90 01/20/2014 0340   GFRAA >90 01/20/2014 0340   CBC    Component Value Date/Time   WBC 4.9 01/20/2014 0340   RBC 3.31* 01/20/2014 0340   RBC 2.92* 01/05/2014 1305   HGB 9.2* 01/20/2014 0340   HCT 28.4* 01/20/2014 0340   PLT 343 01/20/2014 0340   MCV 85.8 01/20/2014 0340   MCH 27.8 01/20/2014 0340   MCHC 32.4 01/20/2014 0340   RDW 15.2 01/20/2014 0340   LYMPHSABS 1.9 01/20/2014 0340   MONOABS 0.5 01/20/2014 0340   EOSABS 0.1 01/20/2014 0340   BASOSABS 0.0 01/20/2014 0340    Studies/Results: Dg Abd 1 View  01/19/2014   CLINICAL DATA:  Constipation   EXAM: ABDOMEN - 1 VIEW  COMPARISON:  CT abdomen pelvis dated 12/14/2013  FINDINGS: Nonobstructive bowel gas pattern.  Normal colonic stool burden.  Degenerative changes of the lumbar spine.  IMPRESSION: Unremarkable abdominal radiograph.   Electronically Signed   By: Julian Hy M.D.   On: 01/19/2014 20:30    Medications: I have reviewed the patient's current medications.  Assessment/Plan:  1. 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  2. DVT Prophylaxis/Anticoagulation: Pharmaceutical: Lovenox-will change to Xarelto per Hem/Onc input.  3. Pain Management: Denies back pain. But will need medication to help manage spacticity.  4. Mood: Reports problems sleeping due to current situation and prn ativan ineffective. She is willing to start low dose antidepressant. Will have chaplin follow for spiritual support as well as LCSW to follow for evaluation and support.  5. Neuropsych: This patient is capable of making decisions on her own behalf.  6. Skin/Wound Care: Air mattress overlay for prevention of break down. Start educating patient and family on need for pressure relief measures.  7. Fluids/Electrolytes/Nutrition: Monitor I/O. Offer supplements if intake poor. Routine check of lytes.  8. Neurogenic bladder: Will change out foley today--continue for now.  9. Neurogenic bowel: Will check KUB for baseline and start on bowel program.  10. Hurtle cell cancer s/p total thyroidectomy: Continue  supplement. For RAI at 6-8 weeks post op.  11. HTN: Will monitor every 8 hours. No medications as this time. Monitor for orthostatic changes once OOB.  12 PCOS? With hyperglycemia: Hgb A1c-6.7. Will modify diet to CM and have RD educate patient on restrictions.  13. E coli UTI--recurrent: continue Keflex D # 7/7.  14. Hemorrhoids: continue proctofoam cream bid in addition to good rectal hygiene to prevent irritation from  stool.      Length of stay, days: 2  Walker Kehr , MD 01/21/2014, 9:38 AM

## 2014-01-21 NOTE — Progress Notes (Signed)
Chaplain attempted visit. Pt asked chaplain to come back later. Will attempt visit at another time.   01/21/14 1100  Clinical Encounter Type  Visited With Patient  Visit Type Follow-up  Referral From Nurse  Consult/Referral To Chaplain  Rolly Salter, Chaplain 01/21/2014 11:17 AM

## 2014-01-21 NOTE — Progress Notes (Signed)
Physical Therapy Session Note  Patient Details  Name: Sarah [name] MRN: [id number] Date of Birth: 08-07-1965  Today's Date: 01/21/2014 PT Individual Time: 0800-0900 PT Individual Time Calculation (min): 60 min   Short Term Goals: Week 1:  PT Short Term Goal 1 (Week 1): Pt will roll R and L in bed without rail req min A.  PT Short Term Goal 2 (Week 1): Pt will demonstrate supine to sit transfer with rail and 1 person assist.  PT Short Term Goal 3 (Week 1): Pt will scoot transfer bed to/from w/c with slideboard and 1 person assist PT Short Term Goal 4 (Week 1): Pt will initiate w/c propulsion.  PT Short Term Goal 5 (Week 1): Pt will tolerate dynamic sit balance req mod A.   Skilled Therapeutic Interventions/Progress Updates:    1:1. Pt received semi reclined in bed. Pt refusing OOB activity at this time due to abdominal discomfort, which pt attributes to having received suppository 2 hours prior to this session. Therefore, session focused on educating pt on the following: spinal precautions, pressure relief, and preservation of skin integrity. Explained spinal precautions and provided paper sign (placed on pt's wall) to facilitate effective carryover. Educated pt on importance of pressure relief (specific to spinal cord injury) and explained the following: recommended frequency, duration, and techniques for pressure relief. Provided pt with timer and placed additional paper sign on pt wall to facilitate carryover of pressure relief. Assisted pt in performing bilat rolling with bed rails, total A for knee flexion, verbal cueing for technique. Retrieved appropriate Maxi Move sling and modified safety plan to recommend [place] for nursing. Departed with pt semi reclined in air fluidized bed with 4 rails up and all needs within reach.  Therapy Documentation Precautions:  Precautions Precautions: Fall;Back Precaution Comments: bil leg weakness, paraplegia Restrictions Weight Bearing  Restrictions: No Pain: Pain Assessment  Pain Assessment: 0-10  Pain Score: 5 Pain Type: Acute pain  Pain Location: Abdomen Pain Orientation: Lower Pain Descriptors / Indicators: Discomfort Pain Onset: Gradual Pain Intervention(s): Refused Multiple Pain Sites: No   See FIM for current functional status  Therapy/Group: Individual Therapy  Hobble, Malva Cogan 01/21/2014, 12:47 PM

## 2014-01-21 NOTE — Progress Notes (Signed)
Occupational Therapy Session Note  Patient Details  Name: Sarah [name] MRN: [id number] Date of Birth: [birth year]  Today's Date: 01/21/2014 OT Individual Time:0900-1000 and  1400-1500 OT Individual Time Calculation (min): 60 min and 60 min   Short Term Goals: Week 1:  OT Short Term Goal 1 (Week 1): Pt will complete upper body bathing with setup assist OT Short Term Goal 2 (Week 1): Pt will rise from supine to sitting at edge of bed with mod assist to manage LE OT Short Term Goal 3 (Week 1): Pt will maintain supported sitting balance at edge of bed for 5 min during assisted lower body dressing OT Short Term Goal 4 (Week 1): Pt will complete lateral leans to pull up pants with mod assist OT Short Term Goal 5 (Week 1): Pt will complete grooming at w/c level with setup assist  Skilled Therapeutic Interventions/Progress Updates:  Session 1: Upon entering the room, pt supine in bed with 5/10 c/o pain in back. Pt reports receiving medication prior to therapist arrival. Bathing and dressing performed supine in bed. Pt washed UB - chest, abdomen, and B arms. Pt requiring assist to wash remaining body parts causing bathing to be max A. Pt performed grooming with set up A for items. Pt able to thread L and R sleeves of shirt but required assist to pull over head and down trunk. Pt rolling L to R with total A of 1 x 5 times each side for clothing management. Pt refusing pants as suppository given earlier but pt had not had BM yet during session. Pt refused to transfer out of bed as well. Pt supine in bed with call bell and all needed items present upon entering the room.    Session 2: Pt continues to be in bed and supine upon entering the room. Pt agrees to allow therapist to assist with donning elastic waist pants. Pt performs rolling L and R x 2 in both directions with total A. Pt refused sliding board transfer and sitting in wheelchair. Therapist utilized maxi move to transfer pt into recliner chair.  Once in chair therapist assisted pt to gym to engage in table top activity for sitting tolerance. Pt requiring maximal assistance to come into upright posture from reclined position with Min - Mod A for sitting balance as pt wants to lean posteriorly. Pt sat and played connect 4 with therapist with anterior weight shift to obtain pieces. Pt remained in recliner chair, feet up, quick release belt donned and call bell within reach upon exiting the room.   Therapy Documentation Precautions:  Precautions Precautions: Fall;Back Precaution Comments: bil leg weakness, paraplegia Restrictions Weight Bearing Restrictions: No ADL: ADL ADL Comments: see FIM  See FIM for current functional status  Therapy/Group: Individual Therapy  Phineas Semen 01/21/2014, 4:27 PM

## 2014-01-22 ENCOUNTER — Inpatient Hospital Stay (HOSPITAL_COMMUNITY): Payer: Medicaid Other | Admitting: Occupational Therapy

## 2014-01-22 ENCOUNTER — Inpatient Hospital Stay (HOSPITAL_COMMUNITY): Payer: Medicaid Other

## 2014-01-22 DIAGNOSIS — K5900 Constipation, unspecified: Secondary | ICD-10-CM | POA: Diagnosis present

## 2014-01-22 DIAGNOSIS — K592 Neurogenic bowel, not elsewhere classified: Secondary | ICD-10-CM | POA: Diagnosis present

## 2014-01-22 LAB — GLUCOSE, CAPILLARY
GLUCOSE-CAPILLARY: 114 mg/dL — AB (ref 70–99)
Glucose-Capillary: 125 mg/dL — ABNORMAL HIGH (ref 70–99)
Glucose-Capillary: 136 mg/dL — ABNORMAL HIGH (ref 70–99)
Glucose-Capillary: 140 mg/dL — ABNORMAL HIGH (ref 70–99)

## 2014-01-22 NOTE — Plan of Care (Signed)
Problem: SCI BOWEL ELIMINATION Goal: RH STG SCI MANAGE BOWEL PROGRAM W/ASSIST OR AS APPROPRIATE STG SCI Manage bowel program w/assist or as appropriate.  Outcome: Progressing Changed to PM bowel program per pt request  Problem: SCI BLADDER ELIMINATION Goal: RH OTHER STG BLADDER ELIMINATION GOALS W/ASSIST Other STG Bladder Elimination Goals With total Assistance (foley in place)  Outcome: Not Applicable Date Met:  83/35/82  Problem: RH SKIN INTEGRITY Goal: RH OTHER STG SKIN INTEGRITY GOALS W/ASSIST Other STG Skin Integrity Goals With Assistance.  Outcome: Not Applicable Date Met:  51/89/84  Problem: RH SAFETY Goal: RH OTHER STG SAFETY GOALS W/ASSIST Other STG Safety Goals With Assistance.  Outcome: Not Applicable Date Met:  21/03/12

## 2014-01-22 NOTE — Progress Notes (Signed)
Physical Therapy Session Note  Patient Details  Name: Sarah [name] MRN: [id number] Date of Birth: April 11, 1965  Today's Date: 01/22/2014 PT Individual Time: 1430-1530 PT Individual Time Calculation (min): 60 min   Short Term Goals: Week 1:  PT Short Term Goal 1 (Week 1): Pt will roll R and L in bed without rail req min A.  PT Short Term Goal 2 (Week 1): Pt will demonstrate supine to sit transfer with rail and 1 person assist.  PT Short Term Goal 3 (Week 1): Pt will scoot transfer bed to/from w/c with slideboard and 1 person assist PT Short Term Goal 4 (Week 1): Pt will initiate w/c propulsion.  PT Short Term Goal 5 (Week 1): Pt will tolerate dynamic sit balance req mod A.   Skilled Therapeutic Interventions/Progress Updates:    Pt received supine in bed, agreeable to participate in therapy.Session focused on bed mobility, functional mobility, patient education, wheelchair management. In bed pt r olled L/R w/ ModA after TotalA to bend knee to dress LB and place sling. Transferred pt bed>w/c w/ MaxiMove. Educated pt and friend on wheelchair leg rest management, tilt in space function, use of tilt for pressure relief. Time spent adjusting leg rests on wheelchair to same height. Seated in chair for seated balance/trunk control pt threw/caught yellow theraball then yellow weighted ball. Pt able to name 3/3 back precautions. At end of session pt verbalized that she has to pressure relieve every 15 minutes and taught back pressure relief methods. NT made aware of pt's position. Pt left seated in w/c w/ friend present and all needs within reach.   Therapy Documentation Precautions:  Precautions Precautions: Fall, Back Precaution Comments: bil leg weakness, paraplegia Restrictions Weight Bearing Restrictions: No General:   Vital Signs: Therapy Vitals Temp: 98 F (36.7 C) Temp Source: Oral Pulse Rate: 87 Resp: 19 BP: 132/80 mmHg Patient Position (if appropriate): Lying Oxygen  Therapy SpO2: 97 % O2 Device: Not Delivered Pain:   Mobility:   Locomotion :    Trunk/Postural Assessment :    Balance:   Exercises:   Other Treatments:    See FIM for current functional status  Therapy/Group: Individual Therapy  Rada Hay  Rada Hay, PT, DPT 01/22/2014, 7:53 AM

## 2014-01-22 NOTE — Progress Notes (Signed)
Occupational Therapy Session Note  Patient Details  Name: Sarah [name] MRN: [id number] Date of Birth: Nov 17, 1965  Today's Date: 01/22/2014 OT Individual Time: 9166-0600 OT Individual Time Calculation (min): 60 min   Skilled Therapeutic Interventions/Progress Updates: Sarah [name] required constant encourgement from this clinician, rehab tech, and her best friend, Sarah [name] to breathe through and trust staff to work on bed mobility and sit EOB.  She worked on gentle pelvic tilts, lateral leans, thoracic extension (without arching her back), core strength, and  oral care.   She required max A x 2 for static and mild dynamic sitting balance.    Lower extremities exhibited high tone and muscle contractions at times during both sessions today.  Anxiety and fear of falling were inhibiting factors during today's session, but she demonstrated great effort, with encourgement, to work through that anxiety.  This clinician issued her a long handled sponge to use for lower body washing for her upcoming baths.    Therapy Documentation Precautions:  Precautions Precautions: Fall, Back Precaution Comments: bil leg weakness, paraplegia Restrictions Weight Bearing Restrictions: No   Pain: 3/10 low back but she declined offer for pain meds.  See FIM for current functional status  Therapy/Group: Individual Therapy  Sarah [name] [facility] 01/22/2014, 2:12 PM

## 2014-01-22 NOTE — Progress Notes (Signed)
Occupational Therapy Session Note  Patient Details  Name: Sarah [name] MRN: [id number] Date of Birth: June 27, 1965  Today's Date: 01/22/2014 OT Individual Time: 5146-0479 OT Individual Time Calculation (min): 60 min   Skilled Therapeutic Interventions/Progress Updates: Patient seen supine in bed for ADL.   Patient was encouraged through her anxiety during the session, and seemed to work better when the visitors left (female cousin and aunt).   She worked more confidently with her best friend Izell Belvedere in the room to encourage her to trust [place] and staff to "not let her fall when attempting to sit upright in bed and working to reach her periarea and upper legs and bed mobility.  With deconditioning and loss of lower body strength and sensation she required max A for bathing and dressing.  [employer] laboratory technician stood by to assist as needed - especially for lateral rolls for peribathing and attempting to work on lower body cleansing skills.     Therapy Documentation Precautions:  Precautions Precautions: Fall, Back Precaution Comments: bil leg weakness, paraplegia Restrictions Weight Bearing Restrictions: No    Pain:3/10 in low back but declined pain meds   See FIM for current functional status  Therapy/Group: Individual Therapy  Alfredia Ferguson [facility] 01/22/2014, 2:04 PM

## 2014-01-22 NOTE — Progress Notes (Signed)
Sarah [name] is a 48 y.o. female [birth year] [id number]  Subjective: No new complaints. Pt would like to have a supp at night (not in am). Slept well.   Objective: Vital signs in last 24 hours: Temp:  [98 F (36.7 C)] 98 F (36.7 C) (11/01 0520) Pulse Rate:  [87] 87 (11/01 0520) Resp:  [19] 19 (11/01 0520) BP: (132)/(80) 132/80 mmHg (11/01 0520) SpO2:  [97 %] 97 % (11/01 0520) Weight change:  Last BM Date: 01/21/14  Intake/Output from previous day: 10/31 0701 - 11/01 0700 In: 600 [P.O.:600] Out: 1100 [Urine:1100] Last cbgs: CBG (last 3)   Recent Labs  01/21/14 1650 01/21/14 2051 01/22/14 0709  GLUCAP 114* 115* 114*     Physical Exam General: No apparent distress  Obese HEENT: not dry Lungs: Normal effort. Lungs clear to auscultation, no crackles or wheezes. Cardiovascular: Regular rate and rhythm, no edema Abdomen: S/NT/ND; BS(+) Musculoskeletal:  unchanged Neurological: No new neurological deficits Wounds: N/A    Skin: clear   Mental state: Alert, oriented, cooperative    Lab Results: BMET    Component Value Date/Time   NA 138 01/20/2014 0340   K 3.4* 01/20/2014 0340   CL 97 01/20/2014 0340   CO2 27 01/20/2014 0340   GLUCOSE 99 01/20/2014 0340   BUN 9 01/20/2014 0340   CREATININE 0.69 01/20/2014 0340   CALCIUM 8.4 01/20/2014 0340   GFRNONAA >90 01/20/2014 0340   GFRAA >90 01/20/2014 0340   CBC    Component Value Date/Time   WBC 4.9 01/20/2014 0340   RBC 3.31* 01/20/2014 0340   RBC 2.92* 01/05/2014 1305   HGB 9.2* 01/20/2014 0340   HCT 28.4* 01/20/2014 0340   PLT 343 01/20/2014 0340   MCV 85.8 01/20/2014 0340   MCH 27.8 01/20/2014 0340   MCHC 32.4 01/20/2014 0340   RDW 15.2 01/20/2014 0340   LYMPHSABS 1.9 01/20/2014 0340   MONOABS 0.5 01/20/2014 0340   EOSABS 0.1 01/20/2014 0340   BASOSABS 0.0 01/20/2014 0340    Studies/Results: No results found.  Medications: I have reviewed the patient's current  medications.  Assessment/Plan:  1. 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  2. DVT Prophylaxis/Anticoagulation: Pharmaceutical: Lovenox-will change to Xarelto per Hem/Onc input.  3. Pain Management: Denies back pain. But will need medication to help manage spacticity.  4. Mood: Reports problems sleeping due to current situation and prn ativan ineffective. She is willing to start low dose antidepressant. Will have chaplin follow for spiritual support as well as LCSW to follow for evaluation and support.  5. Neuropsych: This patient is capable of making decisions on her own behalf.  6. Skin/Wound Care: Air mattress overlay for prevention of break down. Start educating patient and family on need for pressure relief measures.  7. Fluids/Electrolytes/Nutrition: Monitor I/O. Offer supplements if intake poor. Routine check of lytes.  8. Neurogenic bladder: Will change out foley today--continue for now.  9. Neurogenic bowel: Will check KUB for baseline and start on bowel program.  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 For RAI at 6-8 weeks post op.  11. HTN: Will monitor every 8 hours. No medications as this time. Monitor for orthostatic changes once OOB.  12 PCOS? With hyperglycemia: Hgb A1c-6.7. Will modify diet to CM and have RD educate patient on restrictions.  13. E coli UTI--recurrent: continue Keflex D # 7/7.  14. Hemorrhoids: continue proctofoam cream bid in addition to good rectal hygiene  to prevent irritation from stool.   Cont Rx     Length of stay, days: 3  Walker Kehr , MD 01/22/2014, 7:41 AM

## 2014-01-23 ENCOUNTER — Inpatient Hospital Stay (HOSPITAL_COMMUNITY): Payer: Medicaid Other | Admitting: Physical Therapy

## 2014-01-23 ENCOUNTER — Ambulatory Visit
Admit: 2014-01-23 | Discharge: 2014-01-23 | Disposition: A | Discharge status: Home / Self Care | Payer: Medicaid Other | Attending: Radiation Oncology | Admitting: Radiation Oncology

## 2014-01-23 ENCOUNTER — Encounter (HOSPITAL_COMMUNITY): Payer: Medicaid Other | Admitting: Occupational Therapy

## 2014-01-23 ENCOUNTER — Inpatient Hospital Stay (HOSPITAL_COMMUNITY): Payer: Medicaid Other | Admitting: Occupational Therapy

## 2014-01-23 DIAGNOSIS — G839 Paralytic syndrome, unspecified: Secondary | ICD-10-CM

## 2014-01-23 DIAGNOSIS — K5901 Slow transit constipation: Secondary | ICD-10-CM

## 2014-01-23 DIAGNOSIS — K592 Neurogenic bowel, not elsewhere classified: Secondary | ICD-10-CM

## 2014-01-23 DIAGNOSIS — E119 Type 2 diabetes mellitus without complications: Secondary | ICD-10-CM

## 2014-01-23 DIAGNOSIS — C7951 Secondary malignant neoplasm of bone: Secondary | ICD-10-CM

## 2014-01-23 LAB — GLUCOSE, CAPILLARY
GLUCOSE-CAPILLARY: 125 mg/dL — AB (ref 70–99)
GLUCOSE-CAPILLARY: 145 mg/dL — AB (ref 70–99)
Glucose-Capillary: 139 mg/dL — ABNORMAL HIGH (ref 70–99)
Glucose-Capillary: 144 mg/dL — ABNORMAL HIGH (ref 70–99)

## 2014-01-23 MED ORDER — BOOST / RESOURCE BREEZE PO LIQD: 1.0000 | Freq: Every day | ORAL | Status: DC

## 2014-01-23 MED ORDER — GLYCERIN (LAXATIVE) 2.1 G RE SUPP
1.0000 | Freq: Every day | RECTAL | Status: DC
  Administered 2014-01-23 – 2014-02-09 (×11): 1 via RECTAL
  Filled 2014-01-23 (×20): qty 1

## 2014-01-23 MED ORDER — SENNOSIDES-DOCUSATE SODIUM 8.6-50 MG PO TABS
2.0000 | ORAL_TABLET | Freq: Two times a day (BID) | ORAL | Status: DC
  Administered 2014-01-23: 2 via ORAL
  Filled 2014-01-23: qty 2

## 2014-01-23 MED ORDER — SENNOSIDES-DOCUSATE SODIUM 8.6-50 MG PO TABS
2.0000 | ORAL_TABLET | Freq: Two times a day (BID) | ORAL | Status: DC
  Administered 2014-01-23 – 2014-01-30 (×14): 2 via ORAL
  Filled 2014-01-23 (×19): qty 2

## 2014-01-23 NOTE — Plan of Care (Signed)
Problem: RH PAIN MANAGEMENT Goal: RH STG PAIN MANAGED AT OR BELOW PT'S PAIN GOAL Pain less than 3  Outcome: Progressing

## 2014-01-23 NOTE — Plan of Care (Signed)
Problem: SCI BOWEL ELIMINATION Goal: RH STG MANAGE BOWEL WITH ASSISTANCE STG Manage Bowel with max Assistance.  Outcome: Progressing Goal: RH STG SCI MANAGE BOWEL WITH MEDICATION WITH ASSISTANCE STG SCI Manage bowel with medication with minimal assistance.  Outcome: Progressing Goal: RH STG MANAGE BOWEL W/EQUIPMENT W/ASSISTANCE STG Manage Bowel With Equipment With moderate Assistance  Outcome: Progressing

## 2014-01-23 NOTE — Plan of Care (Signed)
Problem: SCI BOWEL ELIMINATION Goal: RH STG SCI MANAGE BOWEL PROGRAM W/ASSIST OR AS APPROPRIATE STG SCI Manage bowel program w/ max assist or as appropriate.  Outcome: Progressing

## 2014-01-23 NOTE — Progress Notes (Signed)
Occupational Therapy Session Note  Patient Details  Name: Sarah [name] MRN: [id number] Date of Birth: Nov 06, 1965  Today's Date: 01/23/2014 OT Individual Time: 3335-4562 OT Individual Calculated Time (min): 60 min   Short Term Goals: Week 1:  OT Short Term Goal 1 (Week 1): Pt will complete upper body bathing with setup assist OT Short Term Goal 2 (Week 1): Pt will rise from supine to sitting at edge of bed with mod assist to manage LE OT Short Term Goal 3 (Week 1): Pt will maintain supported sitting balance at edge of bed for 5 min during assisted lower body dressing OT Short Term Goal 4 (Week 1): Pt will complete lateral leans to pull up pants with mod assist OT Short Term Goal 5 (Week 1): Pt will complete grooming at w/c level with setup assist  Skilled Therapeutic Interventions/Progress Updates:  Skilled OT session focusing on ADL retraining, dynamic sitting balance, bed mobility, and activity tolerance/endurance. Pt supine in bed w/ RN and nurse tech in room helping pt clean bottom after BM when arriving. Pt reporting 2-3/10 pain in back. Pt completed UB B&D while supine in bed w/ HOB elevated. Pt completed LB B&D w/ long handled sponge and rolling L & R. Pt t/f to EOB w/ max A x2 and completed tooth brushing and took medication administered by RN, needing to hold on to bed rail w/ at least one UE at all times to maintain dynamic sitting balance. Pt requiring mod-max A for maintaining sitting balance for 7 min at EOB. Pt t/f to supine and set-up w/ breakfast w/ all needs nearby when leaving. Pt appearing anxious during bed mobility activities this session, as she rubbed "security blanket," but talked to therapist and attempted all steps throughout session.  Therapy Documentation Precautions:  Precautions Precautions: Fall, Back Precaution Comments: bil leg weakness, paraplegia Restrictions Weight Bearing Restrictions: No  See FIM for current functional status  Therapy/Group:  Individual Therapy  Sharis Keeran Raynell 01/23/2014, 12:15 PM

## 2014-01-23 NOTE — Plan of Care (Signed)
Problem: SCI BOWEL ELIMINATION Goal: RH STG MANAGE BOWEL W/EQUIPMENT W/ASSISTANCE STG Manage Bowel With Equipment With moderate Assistance  Outcome: Progressing

## 2014-01-23 NOTE — Plan of Care (Signed)
Problem: Food- and Nutrition-Related Knowledge Deficit (NB-1.1) Goal: Nutrition education Formal process to instruct or train a patient/client in a skill or to impart knowledge to help patients/clients voluntarily manage or modify food choices and eating behavior to maintain or improve health. Outcome: Completed/Met Date Met:  01/23/14 Nutrition Education Note  RD consulted for nutrition education regarding a Heart Healthy/diabetic diet.   Lipid Panel     Component Value Date/Time    TRIG 276* 01/09/2014 2102   Lab Results  Component Value Date    HGBA1C 6.7* 12/13/2013    RD provided "Heart Healthy Nutrition Therapy" and "Type I Diabetes Nutrition Therapy" handout from the Academy of [employer]etics. Reviewed patient's dietary recall. Provided examples on ways to decrease sodium and fat intake in diet. Discouraged intake of processed foods and use of salt shaker. Encouraged fresh fruits and vegetables as well as whole grain sources of carbohydrates to maximize fiber intake. Discussed food that contained carbohydrates and the importance of consistent carbohydrates throughout the day. Discussed the importance of adequate protein intake at each meal snack. Gave examples of diabetic friendly snacks. Teach back method used.  Expect good compliance.  Body mass index is 41.84 kg/(m^2). Pt meets criteria for morbid obesity based on current BMI.  Current diet order is carbohydrate modified, patient is consuming approximately 0-100% of meals at this time. Labs and medications reviewed. No further nutrition interventions warranted at this time. RD contact information provided. If additional nutrition issues arise, please re-consult RD.  Kallie Locks, MS, RD, LDN Pager # [phone] After hours/ weekend pager # [phone]

## 2014-01-23 NOTE — Plan of Care (Signed)
Problem: RH SAFETY Goal: RH STG ADHERE TO SAFETY PRECAUTIONS W/ASSISTANCE/DEVICE STG Adhere to Safety Precautions With minimal Assistance/Device.  Outcome: Progressing

## 2014-01-23 NOTE — Progress Notes (Signed)
48 y.o. female with history of HTN, chronic back pain, morbid obesity who was admitted on 12/10/13 with BLE weakness, inability to walk as well as problems with urinary retention and constipation. Work up revealed a lesion at the T3 level of the upper thoracic spine with pathologic compression fracture and epidural tumor resulting in moderate cord compression with evidence of mild spinal cord edema, as well as right suprahilar mass with pulmonary nodules with mediastinal lymph nodes c/w metastatic disease, RUL & RLL PE, tubular cystic masses bilateral adnexal regions and a massive thyroid goiter causing tracheal deviation to the right with narrowing of trachea. She was started on steriods and foley placed for urinary retention. She underwent thoracic laminectomy with excision of tumor on 09/23 by Dr. Christella Noa and on bedrest pending further surgery. Pathology favoring metastatic adenocarcinoma of lung but metastatic thyroid cancer in differential . 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 Buddy Duty consulted for input. 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 Patient underwent T1-T5 posterolateral arthrodesis with allograft on 10/09 with plans XRT to be initiated 2 weeks post op. Dr. Constance Holster consulted for input and patient underwent total thyroidectomy on 10/19 and extubated without difficulty on 10/20.  Subjective/Complaints: Abd cramping after dulcolax supp Some resultsObjective: Vital Signs: Blood pressure 121/73, pulse 96, temperature 98.4 F (36.9 C), temperature source Oral, resp. rate 18, height 5\' 11"  (1.803 m), weight 136 kg (299 lb 13.2 oz), last menstrual period 12/06/2013, SpO2 97 %. No results found. Results for orders placed or performed during the hospital encounter of 01/19/14 (from the past 72 hour(s))  Glucose, capillary     Status: Abnormal   Collection Time: 01/20/14 11:14 AM  Result  Value Ref Range   Glucose-Capillary 131 (H) 70 - 99 mg/dL   Comment 1 Notify RN   Glucose, capillary     Status: Abnormal   Collection Time: 01/20/14  5:27 PM  Result Value Ref Range   Glucose-Capillary 153 (H) 70 - 99 mg/dL   Comment 1 Notify RN   Glucose, capillary     Status: None   Collection Time: 01/20/14 10:22 PM  Result Value Ref Range   Glucose-Capillary 91 70 - 99 mg/dL  Glucose, capillary     Status: Abnormal   Collection Time: 01/21/14  7:17 AM  Result Value Ref Range   Glucose-Capillary 124 (H) 70 - 99 mg/dL  Glucose, capillary     Status: Abnormal   Collection Time: 01/21/14 11:48 AM  Result Value Ref Range   Glucose-Capillary 117 (H) 70 - 99 mg/dL   Comment 1 Notify RN   Glucose, capillary     Status: Abnormal   Collection Time: 01/21/14  4:50 PM  Result Value Ref Range   Glucose-Capillary 114 (H) 70 - 99 mg/dL   Comment 1 Notify RN   Glucose, capillary     Status: Abnormal   Collection Time: 01/21/14  8:51 PM  Result Value Ref Range   Glucose-Capillary 115 (H) 70 - 99 mg/dL  Glucose, capillary     Status: Abnormal   Collection Time: 01/22/14  7:09 AM  Result Value Ref Range   Glucose-Capillary 114 (H) 70 - 99 mg/dL  Glucose, capillary     Status: Abnormal   Collection Time: 01/22/14 11:40 AM  Result Value Ref Range   Glucose-Capillary 125 (H) 70 - 99 mg/dL   Comment 1 Notify RN   Glucose, capillary  Status: Abnormal   Collection Time: 01/22/14  4:16 PM  Result Value Ref Range   Glucose-Capillary 136 (H) 70 - 99 mg/dL   Comment 1 Notify RN   Glucose, capillary     Status: Abnormal   Collection Time: 01/22/14  9:09 PM  Result Value Ref Range   Glucose-Capillary 140 (H) 70 - 99 mg/dL      Nursing note and vitals reviewed.  Constitutional: She is oriented to person, place, and time. She appears well-developed and well-nourished.  HENT:  Head: Normocephalic and atraumatic.  Eyes: Conjunctivae are normal. Pupils are equal, round, and reactive to  light.  Neck: Normal range of motion.  Cardiovascular: Normal rate and regular rhythm.  No murmur heard.  Respiratory: Effort normal and breath sounds normal. No respiratory distress. She has no wheezes. She exhibits no tenderness (sternal area with radiation markings.).  GI: Soft. Bowel sounds are normal. She exhibits no distension. There is no tenderness.  Musculoskeletal: She exhibits no edema and no tenderness.  Neurological: She is alert and oriented to person, place, and time.  Speech clear. Follows commands without difficulty. Dense paraparesis with flexor spasms and few beats clonus on the right. Diminished sensation 1/2 below Xiphoid.pain from gross touch.  Skin: Skin is warm and dry.  Intergluteal area chapped and abraded due to MAST. Liquid stool on underpad as well as rectal area. Inflamed skin tags with tenderness. Large foam dressing on sacrum.  Psychiatric: She has a normal mood and affect. Her behavior is normal. Judgment and thought content normal.  Motor strength is 5/5 bilateral deltoids, biceps, triceps, grip  0/5 bilateral hip flexors knee extensors ankle dorsiflexor plantar flexor   Assessment/Plan: 1. Functional deficits secondary to Paraplegia which require 3+ hours per day of interdisciplinary therapy in a comprehensive inpatient rehab setting. Physiatrist is providing close team supervision and 24 hour management of active medical problems listed below. Physiatrist and rehab team continue to assess barriers to discharge/monitor patient progress toward functional and medical goals. FIM: FIM - Bathing Bathing Steps Patient Completed: Chest, Right Arm, Left Arm, Abdomen Bathing: 2: Max-Patient completes 3-4 21f 10 parts or 25-49%  FIM - Upper Body Dressing/Undressing Upper body dressing/undressing steps patient completed: Thread/unthread right sleeve of pullover shirt/dresss, Thread/unthread left sleeve of pullover shirt/dress, Put head through opening of pull over  shirt/dress Upper body dressing/undressing: 4: Min-Patient completed 75 plus % of tasks FIM - Lower Body Dressing/Undressing Lower body dressing/undressing: 1: Total-Patient completed less than 25% of tasks  FIM - Toileting Toileting: 0: Activity did not occur  FIM - Air cabin crew Transfers: 0-Activity did not occur  FIM - Control and instrumentation engineer Devices:  Scientist, forensic) Bed/Chair Transfer: 1: Mechanical lift  FIM - Locomotion: Wheelchair Locomotion: Wheelchair: 1: Total Assistance/staff pushes wheelchair (Pt<25%) FIM - Locomotion: Ambulation Locomotion: Ambulation: 0: Activity did not occur  Comprehension Comprehension Mode: Auditory Comprehension: 6-Follows complex conversation/direction: With extra time/assistive device  Expression Expression Mode: Verbal Expression: 6-Expresses complex ideas: With extra time/assistive device  Social Interaction Social Interaction: 5-Interacts appropriately 90% of the time - Needs monitoring or encouragement for participation or interaction.  Problem Solving Problem Solving: 5-Solves basic 90% of the time/requires cueing < 10% of the time  Memory Memory: 6-More than reasonable amt of time  Medical Problem List and Plan:  1. 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  2. DVT Prophylaxis/Anticoagulation: Pharmaceutical: Lovenox-will change to Xarelto per Hem/Onc input.  3. Pain Management: Denies  back pain. But will need medication to help manage spacticity.  4. Mood: Reports problems sleeping due to current situation and prn ativan ineffective. She is willing to start low dose antidepressant. Will have chaplin follow for spiritual support as well as LCSW to follow for evaluation and support.  5. Neuropsych: This patient is capable of making decisions on her own behalf.  6. Skin/Wound Care: Air mattress overlay for prevention  of break down. Start educating patient and family on need for pressure relief measures.  7. Fluids/Electrolytes/Nutrition: Monitor I/O. Offer supplements if intake poor. Routine check of lytes.  8. Neurogenic bladder: Will change out foley today--continue for now.  9. 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 For RAI at 6-8 weeks post op.  11. HTN: Will monitor every 8 hours. No medications as this time. Monitor for orthostatic changes once OOB.  12 PCOS? With hyperglycemia: Hgb A1c-6.7. Will modify diet to CM and have RD educate patient on restrictions.  13. E coli UTI--recurrent: continue Keflex D # 7/7.  14. Hemorrhoids: continue proctofoam cream bid in addition to good rectal hygiene to prevent irritation from stool.    LOS (Days) 4 A FACE TO FACE EVALUATION WAS PERFORMED  Apolonio Cutting E 01/23/2014, 7:08 AM

## 2014-01-23 NOTE — Progress Notes (Signed)
Physical Therapy Session Note  Patient Details  Name: Sarah [name] MRN: [id number] Date of Birth: October 14, 1965  Today's Date: 01/23/2014 PT Individual Time: 1000-1100 Treatment Session 2: 1530-1630 PT Individual Time Calculation (min): 60 min  Treatment Session 2: 60 minutes  Short Term Goals: Week 1:  PT Short Term Goal 1 (Week 1): Pt will roll R and L in bed without rail req min A.  PT Short Term Goal 2 (Week 1): Pt will demonstrate supine to sit transfer with rail and 1 person assist.  PT Short Term Goal 3 (Week 1): Pt will scoot transfer bed to/from w/c with slideboard and 1 person assist PT Short Term Goal 4 (Week 1): Pt will initiate w/c propulsion.  PT Short Term Goal 5 (Week 1): Pt will tolerate dynamic sit balance req mod A.   Skilled Therapeutic Interventions/Progress Updates:    Therapeutic Activity: PT instructs pt in rolling  L in bed with rail req mod A, L side lie to sit with rail req +2 assist.  PT max deflates bed, places slideboard under pt's bottom and wooden step under pt's feet, and instructs pt in scoot transfer with L bedrail and R armrest req +2 assist, with specific verbal cues for head-hips relationship. PT then completes the same transfer, but from w/c to bed and with bed max inflated req +2 assist.  PT instructs pt in sit to supine transfer req +2 assist and verbal cues for leaning on L arm.  Pt req +2 assist to scoot forward/backward while sitting edge of bed.   Therapeutic Exercise: In supine, PT completes PROM to B LEs into hip/knee flexion, hip IR/ER in hook lie, and ankle DF/PF to reduce pt's tone and provide relief/comfort from leg stiffness.  PT instructs pt in pushing leg into extension x 5 reps (minimal movement felt by PT), pulling leg into neutral hip IR (from ER) in hook lie (not muscle activity felt or seen by PT), and in ankle PF (no muscle activity felt/seen by PT) x 5 reps to B LEs. While sitting edge of bed, PT instructs pt in LAQ with tapping  facilitation to quad x 5 reps and small AROM movement seen initiating a LAQ. PT also instructs pt in knee flexion while sitting edge of bed and PT feels pt's hamstrings activated, bilaterally, x 5 reps each.   Treatment Session 2: Neuromuscular Reeducation: PT introduced pt to electric standing frame machine. Pt c/o significant low back pain during first attempt at standing - pt did not quite reach full standing potential due to c/o LBP and request to sit back down. PT gave pt an extended sitting rest break to decrease pain and then completed a second attempt.   Therapeutic Activity: PT and 2 PT Techs assist pt in scooting back in w/c for improved comfort and positioning: one under each arm and one pushing at B knees.  PT and PT Tech use Maxi Move to transfer pt from w/c back to bed.  PT instructs pt in rolling R and L in bed req min-mod A with bedrail to remove Maxi sling. All 4 rails up.   Pt is starting to show slight muscle activation in B LEs, primarily in posterior hip/thigh musculature when flexing knee or pushing entire leg into extension. Tone is also worsening with pt demonstrating 2/4 tone towards extensor synergy. PT to email [employer] rep Serafina Royals for loaner w/c due to widest, appropriate w/c available on this hospital's unit is still a significant squeeze for the  pt. Continue per PT POC.   Therapy Documentation Precautions:  Precautions Precautions: Fall, Back Precaution Comments: bil leg weakness, paraplegia Restrictions Weight Bearing Restrictions: No Vital Signs: Therapy Vitals Temp: 98.5 F (36.9 C) Temp Source: Oral Pulse Rate: (!) 101 (pt nervous about the standing frame) Resp: 18 BP: 119/70 mmHg Patient Position (if appropriate): Sitting Oxygen Therapy SpO2: 100 % O2 Device: Not Delivered Pain: Pain Assessment Pain Assessment: 0-10 Pain Score: 2  Pain Type: Surgical pain Pain Location: Back Pain Orientation: Mid Pain Descriptors / Indicators: Aching Pain  Frequency: Constant Pain Onset: On-going Patients Stated Pain Goal: 3 Pain Intervention(s): Repositioned Multiple Pain Sites: No  See FIM for current functional status  Therapy/Group: Individual Therapy and with Lattie Haw, Insurance claims handler in AM; with [facility] PT Tech in PM  [facility] M 01/23/2014, 12:43 PM

## 2014-01-23 NOTE — Progress Notes (Signed)
NUTRITION FOLLOW UP  DOCUMENTATION CODES Per approved criteria  -Morbid Obesity   INTERVENTION: Provide Resource Breeze po once daily, each supplement provides 250 kcal and 9 grams of protein.  NUTRITION DIAGNOSIS: Inadequate oral intake related to decreased appetite as evidenced by meal completion of 50-85%; improving  Goal: Pt to meet >/= 90% of their estimated nutrition needs; met  Monitor:  PO intake, weight trends, labs, I/O's  Reason for Assessment: MST and low braden score  48 y.o. 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 Work up revealed a lesion at the T3 level of the upper thoracic spine with pathologic compression fracture and epidural tumor resulting in moderate cord compression with evidence of mild spinal cord edema, as well as right suprahilar mass with pulmonary nodules with mediastinal lymph nodes c/w metastatic disease.  Pt reports her appetite is improving. Meal completion has varied from 0-100%. Meal completion is AM is 100%. Pt reports drinking her Resource Breeze. Will decrease supplement to once daily due to po improving.   Height: Ht Readings from Last 1 Encounters:  01/19/14 5' 11"  (1.803 m)    Weight: Wt Readings from Last 1 Encounters:  01/19/14 299 lb 13.2 oz (136 kg)    BMI:  Body mass index is 41.84 kg/(m^2). Morbid obesity  Re-Estimated Nutritional Needs: Kcal: 2100-2400 Protein: 135-150 grams Fluid: 2.1 - 2.4 L/day  Skin: incision on back and neck  Diet Order: Diet Carb Modified    Intake/Output Summary (Last 24 hours) at 01/23/14 1334 Last data filed at 01/23/14 0830  Gross per 24 hour  Intake   1100 ml  Output   2150 ml  Net  -1050 ml    Last BM: 11/2  Labs:   Recent Labs Lab 01/19/14 0510  01/20/14 0340  NA 138 138  K 3.4* 3.4*  CL 98 97  CO2 27 27  BUN 10 9  CREATININE 0.66 0.69  CALCIUM 8.4 8.4  GLUCOSE 91 99    CBG (last 3)   Recent Labs  01/22/14 2109 01/23/14 0700 01/23/14 1152  GLUCAP 140* 125* 139*    Scheduled Meds: . feeding supplement (RESOURCE BREEZE)  1 Container Oral BID BM  . Glycerin (Adult)  1 suppository Rectal QHS  . hydrocortisone-pramoxine  1 applicator Rectal BID  . insulin aspart  0-15 Units Subcutaneous TID WC  . insulin aspart  0-5 Units Subcutaneous QHS  . levothyroxine  250 mcg Oral QAC breakfast  . Rivaroxaban  15 mg Oral BID WC  . [START ON 02/10/2014] rivaroxaban  20 mg Oral Daily  . senna-docusate  2 tablet Oral BID    Continuous Infusions:   Past Medical History  Diagnosis Date  . Hypertension   . DDD (degenerative disc disease), cervical   . DJD (degenerative joint disease)   . Obesity   . Chronic back pain   . 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 History of rectal fissure   . Rotator cuff tendonitis right    Past Surgical History  Procedure Laterality Date  . Tonsillectomy    . Laminectomy N/A 12/14/2013    Procedure: THORACIC LAMINECTOMY FOR TUMOR THORACIC THREE;  Surgeon: Ashok Pall, MD;  Location: [place] [facility];  Service: Neurosurgery;  Laterality: N/A;  THORACIC LAMINECTOMY FOR TUMOR THORACIC THREE  . Posterior lumbar fusion 4 level N/A 12/30/2013    Procedure: Thoracic one-Thoracic five posterior thoracic fusion with pedicle screws;  Surgeon: Ashok Pall, MD;  Location: [place] [facility];  Service: Neurosurgery;  Laterality: N/A;  Thoracic one-Thoracic five posterior thoracic fusion with pedicle screws  . Thyroidectomy N/A 01/09/2014    Procedure: TOTAL THYROIDECTOMY;  Surgeon: Izora Gala, MD;  Location: [place];  Service: ENT;  Laterality: N/A;    Kallie Locks, MS, RD, LDN Pager # [phone] After hours/ weekend pager # [phone]

## 2014-01-23 NOTE — Plan of Care (Signed)
Problem: RH SKIN INTEGRITY Goal: RH STG ABLE TO PERFORM INCISION/WOUND CARE W/ASSISTANCE STG Able To Perform Incision/Wound Care With moderate Assistance.  Outcome: Progressing

## 2014-01-23 NOTE — Progress Notes (Signed)
[facility] Therapy Treatment Record Phone [phone]   Radiation Therapy was administered to Sarah [name] on: 01/23/2014  12:41 PM and was treatment # 4 out of a planned course of 10 treatments.

## 2014-01-23 NOTE — Plan of Care (Signed)
Problem: SCI BLADDER ELIMINATION Goal: RH STG MANAGE BLADDER WITH ASSISTANCE STG Manage Bladder With total Assistance (foley in place)  Outcome: Progressing

## 2014-01-23 NOTE — Progress Notes (Signed)
Recreational Therapy Assessment and Plan  Patient Details  Name: Sarah [name] MRN: [id number] Date of Birth: [birth year] Today's Date: 01/23/2014  Rehab Potential: Good ELOS: 3 weeks   Assessment Clinical Impression:Problem List:  Patient Active Problem List   Diagnosis Date Noted  . Metastatic cancer to spine 01/19/2014  . Rotator cuff tendonitis   . Acute postoperative respiratory failure 01/09/2014  . 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 Spine metastasis 12/15/2013  . Leg weakness, bilateral 12/13/2013  . Intractable low back pain 12/10/2013  . Back pain 12/10/2013    Past Medical History:  Past Medical History  Diagnosis Date  . Hypertension   . DDD (degenerative disc disease), cervical   . DJD (degenerative joint disease)   . Obesity   . Chronic back pain   . 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 History of rectal fissure   . Rotator cuff tendonitis right   Past Surgical History:  Past Surgical History  Procedure Laterality Date  . Tonsillectomy    . Laminectomy N/A 12/14/2013    Procedure: THORACIC LAMINECTOMY FOR TUMOR THORACIC THREE; Surgeon: Ashok Pall, MD; Location: [place] [facility]; Service: Neurosurgery; Laterality: N/A; THORACIC LAMINECTOMY FOR TUMOR THORACIC THREE  . Posterior lumbar fusion 4 level N/A 12/30/2013    Procedure: Thoracic one-Thoracic five posterior thoracic fusion with pedicle screws; Surgeon: Ashok Pall, MD; Location: [place] [facility]; Service: Neurosurgery; Laterality: N/A; Thoracic one-Thoracic five posterior thoracic fusion with pedicle screws  . Thyroidectomy N/A 01/09/2014    Procedure: TOTAL THYROIDECTOMY; Surgeon: Izora Gala, MD; Location: [facility]; Service: ENT; Laterality: N/A;    Assessment & Plan Clinical Impression: Sarah [name] is a  48 y.o. female with history of HTN, chronic back pain, morbid obesity who was admitted on 12/10/13 with BLE weakness, inability to walk as well as problems with urinary retention and constipation. Work up revealed a lesion at the T3 level of the upper thoracic spine with pathologic compression fracture and epidural tumor resulting in moderate cord compression with evidence of mild spinal cord edema, as well as right suprahilar mass with pulmonary nodules with mediastinal lymph nodes c/w metastatic disease, RUL & RLL PE, tubular cystic masses bilateral adnexal regions and a massive thyroid goiter causing tracheal deviation to the right with narrowing of trachea. She was started on steriods and foley placed for urinary retention. She underwent thoracic laminectomy with excision of tumor on 09/23 by Dr. Christella Noa and on bedrest pending further surgery. Pathology favoring metastatic adenocarcinoma of lung but metastatic thyroid cancer in differential . 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 Buddy Duty consulted for input. 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 Patient underwent T1-T5 posterolateral arthrodesis with allograft on 10/09 with plans XRT to be initiated 2 weeks post op. Dr. Constance Holster consulted for input and patient underwent total thyroidectomy on 10/19 and extubated without difficulty on 10/20. 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 She was changed back to Lovenox as platelets have stabilized. Dr. Hosie Poisson recommends transition to Xarelto once all surgeries completed. She continues to have paraparesis and therapy working on bed mobility as well as sitting balance at edge  of bed. Recurrent  UTI treated with Keflex. Foley remains in place and bowel incontinence reported as well as constipation issues reported. Dr. [name] consulted for XRT to spine for pain palliation as well as prevention of further spinal cord injury. Set for simulation on 10/27 with 2 weeks therapy reported. Right hilar mass without clear pathologic diagnosis and patient to follow up with Dr. Lamonte Sakai in 1-2 months on outpatient basis. Dr. Earlie Server to follow for input on chemotherapy on metastatic Hurthle cell cancer? CIR recommended for follow up therapy by MD and rehab team.  Patient transferred to [place] on 01/19/2014.   Pt presents with decreased activity tolerance, decreased functional mobility, decreased balance, decreased coordiantion Limiting pt's independence with leisure/community pursuits.2   Leisure History/Participation Premorbid leisure interest/current participation:  (playing with 2yo nephew) Other Leisure Interests: Television;Movies Leisure Participation Style: With Family/Friends Awareness of Community Resources: Good-identify 3 post discharge leisure resources Psychosocial / Spiritual Spiritual Interests: [place]: Cooperative Academic librarian Appropriate for Education?: Yes Recreational Therapy Orientation Orientation -Reviewed with patient: Available activity resources Strengths/Weaknesses Patient Strengths/Abilities: Willingness to participate Patient weaknesses: Physical limitations TR Patient demonstrates impairments in the following area(s): Endurance;Motor;Pain;Sensory;Skin Integrity  Plan Rec Therapy Plan Is patient appropriate for Therapeutic Recreation?: Yes Rehab Potential: Good Treatment times per week: Min 1 time per week >20 minutes Estimated Length of Stay: 3 weeks TR Treatment/Interventions: Adaptive equipment instruction;1:1 session;Balance/vestibular training;Functional mobility training;Community reintegration;Patient/family  education;Therapeutic activities;Recreation/leisure participation;Therapeutic exercise;UE/LE Coordination activities;Wheelchair propulsion/positioning Recommendations for other services: Neuropsych  Recommendations for other services: None  Discharge Criteria: Patient will be discharged from TR if patient refuses treatment 3 consecutive times without medical reason.  If treatment goals not met, if there is a change in medical status, if patient makes no progress towards goals or if patient is discharged from hospital.  The above assessment, treatment plan, treatment alternatives and goals were discussed and mutually agreed upon: by patient  [place] 01/23/2014, 12:53 PM

## 2014-01-23 NOTE — Plan of Care (Signed)
Problem: SCI BOWEL ELIMINATION Goal: RH STG SCI MANAGE BOWEL WITH MEDICATION WITH ASSISTANCE STG SCI Manage bowel with medication with minimal assistance.  Outcome: Progressing

## 2014-01-23 NOTE — Progress Notes (Signed)
Occupational Therapy Session Note  Patient Details  Name: Sarah [name] MRN: [id number] Date of Birth: Dec 17, 1965  Today's Date: 01/23/2014 OT Individual Time: 1400-1500 OT Individual Time Calculation (min): 60 min   Short Term Goals: Week 1:  OT Short Term Goal 1 (Week 1): Pt will complete upper body bathing with setup assist OT Short Term Goal 2 (Week 1): Pt will rise from supine to sitting at edge of bed with mod assist to manage LE OT Short Term Goal 3 (Week 1): Pt will maintain supported sitting balance at edge of bed for 5 min during assisted lower body dressing OT Short Term Goal 4 (Week 1): Pt will complete lateral leans to pull up pants with mod assist OT Short Term Goal 5 (Week 1): Pt will complete grooming at w/c level with setup assist  Skilled Therapeutic Interventions/Progress Updates:  Patient resting in bed upon arrival with family and friend visiting.  Visitors left and patient engaged in [employer] transfers, grooming tasks at sink and watched demonstration of lateral lean techniques to use to complete clothing management. Patient declined to transfer using the sliding board this afternoon stating that she completed that 2 times with PT this morning and requested OOB with the Maxi Move.  Patient practiced log rolling and bed mobility to donn an adult brief and to don lift sling.  Once in tilt in space w/c chest strap applied for patient's safety. Patient completed grooming tasks at sink to include sitting balance without her back fully supported against the back of the w/c and she had no LOB and she was able to complete at least 50% of the tasks without  holding on to the sink or w/c.  Patient able to recall 2/3 back precautions, able to state that when she is up in the w/c she has to call every 15 minutes to tilt her w/c back to accomplish pressure relief on her buttocks.  This OT demonstrated lateral leans to prepare for clothing management the used theraband to simulate waistband  of pants to demonstrate pulling pants up to waist.  Patient set up to eat her lunch up in the w/c.  Therapy Documentation Precautions:  Precautions Precautions: Fall, Back Precaution Comments: bil leg weakness, paraplegia Restrictions Weight Bearing Restrictions: No Pain: Patient reports back pain however not rated. Rest, repositioned and RN aware however patient wanted to eat lunch (arrived late) before taking medication on an empty stomach. ADL: See FIM for current functional status  Therapy/Group: Individual Therapy  Shailen Thielen 01/23/2014, 1:08 PM

## 2014-01-23 NOTE — Plan of Care (Signed)
Problem: RH SKIN INTEGRITY Goal: RH STG SKIN FREE OF INFECTION/BREAKDOWN Skin free of infection/breakdown with minimal assistance  Outcome: Progressing

## 2014-01-23 NOTE — Plan of Care (Signed)
Problem: SCI BOWEL ELIMINATION Goal: RH STG MANAGE BOWEL WITH ASSISTANCE STG Manage Bowel with max Assistance.  Outcome: Progressing

## 2014-01-24 ENCOUNTER — Inpatient Hospital Stay (HOSPITAL_COMMUNITY): Payer: Medicaid Other

## 2014-01-24 ENCOUNTER — Ambulatory Visit
Admit: 2014-01-24 | Discharge: 2014-01-24 | Disposition: A | Discharge status: Home / Self Care | Payer: Medicaid Other | Attending: Radiation Oncology | Admitting: Radiation Oncology

## 2014-01-24 ENCOUNTER — Inpatient Hospital Stay (HOSPITAL_COMMUNITY): Payer: Medicaid Other | Admitting: Occupational Therapy

## 2014-01-24 ENCOUNTER — Inpatient Hospital Stay (HOSPITAL_COMMUNITY): Payer: Medicaid Other | Admitting: *Deleted

## 2014-01-24 DIAGNOSIS — E089 Diabetes mellitus due to underlying condition without complications: Secondary | ICD-10-CM

## 2014-01-24 LAB — GLUCOSE, CAPILLARY
GLUCOSE-CAPILLARY: 161 mg/dL — AB (ref 70–99)
GLUCOSE-CAPILLARY: 142 mg/dL — AB (ref 70–99)
Glucose-Capillary: 123 mg/dL — ABNORMAL HIGH (ref 70–99)
Glucose-Capillary: 159 mg/dL — ABNORMAL HIGH (ref 70–99)

## 2014-01-24 NOTE — Progress Notes (Signed)
[facility] Therapy Treatment Record Phone [phone]   Radiation Therapy was administered to Sarah [name] on: 01/24/2014  12:38 PM and was treatment # 5 out of a planned course of 10 treatments.

## 2014-01-24 NOTE — Plan of Care (Signed)
Problem: SCI BOWEL ELIMINATION Goal: RH STG MANAGE BOWEL WITH ASSISTANCE STG Manage Bowel with max Assistance.  Outcome: Progressing Goal: RH STG SCI MANAGE BOWEL WITH MEDICATION WITH ASSISTANCE STG SCI Manage bowel with medication with minimal assistance.  Outcome: Progressing Goal: RH STG MANAGE BOWEL W/EQUIPMENT W/ASSISTANCE STG Manage Bowel With Equipment With moderate Assistance  Outcome: Progressing Goal: RH STG SCI MANAGE BOWEL PROGRAM W/ASSIST OR AS APPROPRIATE STG SCI Manage bowel program w/ max assist or as appropriate.  Outcome: Progressing  Problem: SCI BLADDER ELIMINATION Goal: RH STG MANAGE BLADDER WITH ASSISTANCE STG Manage Bladder With total Assistance (foley in place)  Outcome: Progressing Goal: RH STG SCI MANAGE BLADDER PROGRAM W/ASSISTANCE Outcome: Progressing

## 2014-01-24 NOTE — Interval H&P Note (Signed)
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  The patient's history has been reviewed, patient examined, and there is no change in status.  Patient continues to be appropriate for intensive inpatient rehabilitation.  I have reviewed the patient's chart and labs.  Questions were answered to the patient's satisfaction.  This encounter and document were completed on 01/19/14. The H&P is now being placed in the rehab encounter for the purpose of charting.   Eather Chaires T 01/24/2014, 9:30 PM

## 2014-01-24 NOTE — Progress Notes (Signed)
Occupational Therapy Session Note  Patient Details  Name: Sarah [name] MRN: [id number] Date of Birth: [birth year]  Today's Date: 01/24/2014 OT Individual Time: 0800-0900 OT Individual Time Calculation (min): 60 min    Short Term Goals: Week 1:  OT Short Term Goal 1 (Week 1): Pt will complete upper body bathing with setup assist OT Short Term Goal 2 (Week 1): Pt will rise from supine to sitting at edge of bed with mod assist to manage LE OT Short Term Goal 3 (Week 1): Pt will maintain supported sitting balance at edge of bed for 5 min during assisted lower body dressing OT Short Term Goal 4 (Week 1): Pt will complete lateral leans to pull up pants with mod assist OT Short Term Goal 5 (Week 1): Pt will complete grooming at w/c level with setup assist  Skilled Therapeutic Interventions/Progress Updates: ADL-retraining with focus on bed mobility, adapted bathing, and AE training to improve peri-area cleaning.   Pt received supine in bed with complaint of fatigue and requesting review of B & D schedule due to ongoing radiation therapy treatments.   After review of tasks and schedule, pt will complete bathing in am but will dress in pm after radiation treatments.   Pt was educated on use of leg lifter to help manage LE allowing improved access to peri-area however she reported that access has historically been difficult d/t limited reach over her girth.   Pt successfully completed UB bathing with only setup assist but continues to require total assist for lower body.   Pt remained in bed at end of session d/t need to transfer back to bed for radiation treatment before next scheduled therapy session.    All needs placed within reach.    Therapy Documentation Precautions:  Precautions Precautions: Fall, Back Precaution Comments: bil leg weakness, paraplegia Restrictions Weight Bearing Restrictions: No  Pain: Pain Assessment Pain Score: 4  Pain Type: Acute pain Pain Location: Back Pain  Descriptors / Indicators: Aching  ADL: ADL ADL Comments: see FIM  See FIM for current functional status  Therapy/Group: Individual Therapy  [place] 01/24/2014, 12:05 PM

## 2014-01-24 NOTE — Progress Notes (Signed)
Xarelto per pharmacy for PE  48 YOF with new RLL PE full dose Lovenox transitioned to xarelto on 10/30.   - hgb 9.2, plt wnl as of 10/30 - scr 0.68, est. crcl > 100 ml/min. LFTs wnl.  - no bleeding noted  Plan:  - Continue Xarelto 15mg  BID with food for 21 days through 11/19, then 20mg  once daily from 11/20 - Educated patient on Xarelto - done 10/31 - Monitor for s/sx of bleeding - Pharmacy sign off, will continue f/u peripherally

## 2014-01-24 NOTE — Plan of Care (Signed)
Problem: RH Ambulation Goal: LTG Patient will ambulate in controlled environment (PT) LTG: Patient will ambulate in a controlled environment, # of feet with assistance (PT).  Outcome: Not Applicable Date Met:  17/79/39 D/C'ed 01/24/14 after discussion w/ treatment team

## 2014-01-24 NOTE — Progress Notes (Signed)
Recreational Therapy Session Note  Patient Details  Name: Sarah [name] MRN: [id number] Date of Birth: [birth year] Today's Date: 01/24/2014  Pain: c/o intermittent back pain, relieved with repositioning Skilled Therapeutic Interventions/Progress Updates: Session focused on activity tolerance, bed mobility, slide board transfers, deep breathing techniques, & w/c positioning.  Pt in bed upon arrival attempting to eat breakfast, but reporting no appetite/taste for items on her tray.  PT got vanilla yogurt from nurses station in which pt stated was appetizing.  Pt performed bed mobility & slide board transfer to & from w/c with +2 assist.  Pt required instructional cuing for deep breathing exercises to assist with anxiety management/fear of falling during transfers.  Discussion with pt about potential schedule changes to maximize therapy time & interventions, pt agreeable to modified therapy scheudule.    Therapy/Group: Co-Treatment   [place] Whitenight 01/24/2014, 4:08 PM

## 2014-01-24 NOTE — Patient Care Conference (Signed)
Inpatient RehabilitationTeam Conference and Plan of Care Update Date: 01/24/2014   Time: 2:10 PM    Patient Name: Sarah [name]      Medical Record Number: [id number]  Date of Birth: 1966/03/10 Sex: Female         Room/Bed: 4M10C/4M10C-01 Payor Info: Payor: MEDICAID [place] / Plan: MEDICAID [place] / Product Type: *No Product type* /    Admitting Diagnosis: t3 incomplete para who receives radioation daily   Admit Date/Time:  01/19/2014  5:29 PM Admission Comments: No comment available   Primary Diagnosis:  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 Principal Problem: 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  Patient Active Problem List   Diagnosis Date Noted  . Diabetes 01/23/2014  . Constipation   . Neurogenic bowel   . 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 Metastatic cancer to spine 01/19/2014  . Rotator cuff tendonitis   . Acute postoperative respiratory failure 01/09/2014  . 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 Spine metastasis 12/15/2013  . Leg weakness, bilateral 12/13/2013  . Intractable low back pain 12/10/2013  . Back pain 12/10/2013    Expected Discharge Date: Expected Discharge Date: 02/10/14  Team Members Present: Physician leading conference: Dr. Alger Simons Social Worker Present: Lennart Pall, LCSW Nurse Present: Elliot Cousin, RN PT Present: Raylene Everts, PT;Jess Anastasia Fiedler, PT OT Present: [name], OT SLP Present: Weston Anna, SLP Other (Discipline and Name): Danne Baxter, RN [facility]) PPS Coordinator present : Ileana Ladd, PT     Current Status/Progress Goal Weekly Team Focus  Medical   metastatic cancer to T4 spine. anxiety with movement, pain controlled  increase pt and family education,  anxiety mgt, bowel/bladder, sci education   Bowel/Bladder   incontinent of bowel, Senokot-S 2 tabs po BID 8,12 and glycerin suppistory q HS, Foley intact to straight drain changed 01/11/14  decrease episodes of incontinence of bowel ,  management of foley      Swallow/Nutrition/ Hydration   CMM, appetite poor         ADL's   mod-max A for LB B&D, min-S for UB B&D while supine in bed, max A x2 for supine to EOB, Maxi move or +2 for slide board t/f  Overall min A for ADLs at w/c level  ADL retraining, UE strength and ROM, dynamic sitting balance, bed mobility, functional t/f   Mobility   +2A for sliding board transfers, if fatigued TotalA for bed<>w/c, Max to +2 for bed mobility  S for dynamic sitting, bed mobility, MinA for bed<>chair and car transfers  Sitting balance, sliding board transfers, bed mobility   Communication             Safety/Cognition/ Behavioral Observations            Pain   c/o back pain, prn norco every 4 hours  3 or less on scale of 1-10      Skin   neck incision intact, open to air, back incision intact with sutures open to air  no new skin breakdown       Rehab Goals Patient on target to meet rehab goals: Yes *See Care Plan and progress notes for long and short-term goals.  Barriers to Discharge: substantial care needs, awareness of these needs    Possible Resolutions to Barriers:  family education, adaptive equipment    Discharge Planning/Teaching Needs:  home with sister and brother-in-law plus mother providing all needed assistance      Team Discussion:  Still with some anxiety with movement (fear of falling).  Neurogenic b/b - developing program.  Concern that she may not progress much past mod/ max assist w/c level and may even need to utilize a hoyer lift at home.  Can family manage this LOC?  SW to follow up.  Need to look into home accessibility as well.  [employer] to come and evaluate pt on Friday.  Revisions to Treatment Plan:  Downgrading some goals.    Continued Need for Acute Rehabilitation Level of Care: The patient requires daily medical management by a physician with specialized training in physical medicine and rehabilitation for the following conditions: Daily  direction of a multidisciplinary physical rehabilitation program to ensure safe treatment while eliciting the highest outcome that is of practical value to the patient.: Yes Daily medical management of patient stability for increased activity during participation in an intensive rehabilitation regime.: Yes Daily analysis of laboratory values and/or radiology reports with any subsequent need for medication adjustment of medical intervention for : Neurological problems;Post surgical problems  Sarah [name] 01/24/2014, 4:01 PM

## 2014-01-24 NOTE — Plan of Care (Signed)
Problem: RH SKIN INTEGRITY Goal: RH STG SKIN FREE OF INFECTION/BREAKDOWN Skin free of infection/breakdown with moderatel assistance  Outcome: Progressing Goal: RH STG MAINTAIN SKIN INTEGRITY WITH ASSISTANCE STG Maintain Skin Integrity With moderate Assistance.  Outcome: Progressing Goal: RH STG ABLE TO PERFORM INCISION/WOUND CARE W/ASSISTANCE STG Able To Perform Incision/Wound Care With total Assistance of back incision.  Outcome: Progressing  Problem: RH SAFETY Goal: RH STG ADHERE TO SAFETY PRECAUTIONS W/ASSISTANCE/DEVICE STG Adhere to Safety Precautions With minimal Assistance/Device.  Outcome: Progressing Goal: RH STG DEMO UNDERSTANDING HOME SAFETY PRECAUTIONS Outcome: Progressing  Problem: RH PAIN MANAGEMENT Goal: RH STG PAIN MANAGED AT OR BELOW PT'S PAIN GOAL Pain less than 3  Outcome: Progressing

## 2014-01-24 NOTE — H&P (View-Only) (Signed)
Physical Medicine and Rehabilitation Admission H&P  Chief Complaint   Patient presents with   .  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 female with history of HTN, chronic back pain, morbid obesity who was admitted on 12/10/13 with BLE weakness, inability to walk as well as problems with urinary retention and constipation. Work up revealed a lesion at the T3 level of the upper thoracic spine with pathologic compression fracture and epidural tumor resulting in moderate cord compression with evidence of mild spinal cord edema, as well as right suprahilar mass with pulmonary nodules with mediastinal lymph nodes c/w metastatic disease, RUL & RLL PE, tubular cystic masses bilateral adnexal regions and a massive thyroid goiter causing tracheal deviation to the right with narrowing of trachea. She was started on steriods and foley placed for urinary retention. She underwent thoracic laminectomy with excision of tumor on 09/23 by Dr. Christella Noa and on bedrest pending further surgery. Pathology favoring metastatic adenocarcinoma of lung but metastatic thyroid cancer in differential . 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 Buddy Duty consulted for input. 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 Patient underwent T1-T5 posterolateral arthrodesis with allograft on 10/09 with plans XRT to be initiated 2 weeks post op. Dr. Constance Holster consulted for input and patient underwent total thyroidectomy on 10/19 and extubated without difficulty on 10/20. 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 She was changed back to Lovenox as platelets have stabilized.  Dr. Hosie Poisson recommends transition to Xarelto once all surgeries completed.  She continues to have paraparesis and therapy working on bed mobility as well as sitting balance at edge of bed. Recurrent UTI treated with Keflex. Foley remains in place and bowel incontinence reported as well as constipation issues reported. Dr. [name] consulted for XRT to spine for pain palliation as well as prevention of further spinal cord injury. Set for simulation on 10/27 with 2 weeks therapy reported. Right hilar mass without clear pathologic diagnosis and patient to follow up with Dr. Lamonte Sakai in 1-2 months on outpatient basis. Dr. Earlie Server to follow for input on chemotherapy on metastatic Hurthle cell cancer? CIR recommended for follow up therapy by MD and rehab team.   Patient denies any swallowing problems, denies neck pain, does have some mid back pain  Review of Systems  HENT: Negative for hearing loss.  Eyes: Negative for blurred vision and double vision.  Respiratory: Negative for cough.  Cardiovascular: Negative for chest pain and palpitations.  Gastrointestinal: Positive for diarrhea and constipation. Negative for nausea and vomiting.  Musculoskeletal: Negative for back pain and myalgias.  Neurological: Positive for sensory change and focal weakness. Negative for headaches.  Psychiatric/Behavioral: The patient is nervous/anxious and has insomnia.   Past Medical History   Diagnosis  Date   .  Hypertension    .  DDD (degenerative disc disease), cervical    .  DJD (degenerative joint disease)    .  Obesity    .  Chronic back pain    .  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  History of rectal fissure     Past Surgical  History   Procedure  Laterality  Date   .  Tonsillectomy     .  Laminectomy  N/A  12/14/2013     Procedure: THORACIC LAMINECTOMY FOR TUMOR THORACIC THREE; Surgeon: Ashok Pall, MD; Location: [facility]; Service: Neurosurgery; Laterality: N/A;  THORACIC LAMINECTOMY FOR TUMOR THORACIC THREE   .  Posterior lumbar fusion 4 level  N/A  12/30/2013     Procedure: Thoracic one-Thoracic five posterior thoracic fusion with pedicle screws; Surgeon: Ashok Pall, MD; Location: [facility]; Service: Neurosurgery; Laterality: N/A; Thoracic one-Thoracic five posterior thoracic fusion with pedicle screws   .  Thyroidectomy  N/A  01/09/2014     Procedure: TOTAL THYROIDECTOMY; Surgeon: Izora Gala, MD; Location: [facility] OR; Service: ENT; Laterality: N/A;    Family History   Problem  Relation  Age of Onset   .  Hypertension  Mother    .  Diabetes  Mother    .  Hypertension  Father    .  Stroke  Father     Social History: Single. Lives with sister and was working at [employer]. Parents and sister plan on assisting past discharge. Per reports that she has never smoked but exposed to second hand smoke. She has never used smokeless tobacco. She reports that she does not drink alcohol or use illicit drugs.  Allergies   Allergen  Reactions   .  Tramadol  Nausea And Vomiting    Medications Prior to Admission   Medication  Sig  Dispense  Refill   .  albuterol (PROVENTIL HFA;VENTOLIN HFA) 108 (90 BASE) MCG/ACT inhaler  Inhale 2 puffs into the lungs every 6 (six) hours as needed for wheezing or shortness of breath.     .  diazepam (VALIUM) 5 MG tablet  Take 5 mg by mouth 3 (three) times daily as needed for muscle spasms (pain).     [name] Kitchen  ibuprofen (ADVIL,MOTRIN) 200 MG tablet  Take 600 mg by mouth every 6 (six) hours as needed for mild pain.     .  cyclobenzaprine (FLEXERIL) 5 MG tablet  Take 1 tablet (5 mg total) by mouth 3 (three) times daily as needed for muscle spasms.  30 tablet  0   .  diclofenac (VOLTAREN) 75 MG EC tablet  Take 1 tablet (75 mg total) by mouth 2 (two) times daily.  14 tablet  0   .  HYDROcodone-acetaminophen (NORCO/VICODIN) 5-325 MG per tablet  Take 1 tablet by mouth every 4 (four) hours as needed.  20 tablet  0   .  losartan (COZAAR) 25  MG tablet  Take 0.5 tablets (12.5 mg total) by mouth daily.  30 tablet  0    Home:  Home Living  Family/patient expects to be discharged to:: Private residence  Living Arrangements: Other relatives  Available Help at Discharge: Family;Available 24 hours/day  Type of Home: House  Home Access: Stairs to enter  [employer] of Steps: 2 (deep enough for all 4 legs of RW)  Entrance Stairs-Rails: None  Home Layout: One level  Home Equipment: Walker - 2 wheels  Functional History:  Prior Function  Level of Independence: Independent  Comments: Pt works at [employer].  Functional Status:  Mobility:  Bed Mobility  Overal bed mobility: Needs Assistance;+2 for physical assistance  Bed Mobility: Rolling;Sidelying to Sit;Sit to Sidelying  Rolling: Mod assist  Sidelying to sit: Max assist;+2 for physical assistance  Supine to sit: +2 for physical assistance;Max assist  Sit to supine: +  2 for physical assistance;Total assist  Sit to sidelying: Max assist;+2 for physical assistance  General bed mobility comments: utilized railing for rolling side to side. A for BLE and for trunk support into sitting. Total A for positioning back into supine  Transfers  Overall transfer level: Needs assistance  General transfer comment: maxi move from bed to recliner with 2 person assist for safety and manipulation of environment   Wheelchair Mobility  Wheelchair mobility: Yes  Wheelchair propulsion: Both upper extremities  Wheelchair parts: Supervision/cueing  Distance: 85  Wheelchair Assistance Details (indicate cue type and reason): Pt pushed out of the tight room with many obstacles into the wide hallway and she was able to wheel herself with bil upper extremities further down the hall than yesterday and with only supervision assistance. Next session WC mobility should focus on navigating more obstacles and turns.  ADL:  ADL  Overall ADL's : Needs assistance/impaired  Eating/Feeding: Set  up;Bed level  Eating/Feeding Details (indicate cue type and reason): pt reports difficulty drinking from cups with short straws. Provided pt with tubing into ice container to increase independence with drinking.  Grooming: Dance movement psychotherapist;Bed level;Set up  Upper Body Bathing: Minimal assitance;Bed level  General ADL Comments: Pt with bowel incontinence without awareness, total assist to clean periarea and change pad.  Cognition:  Cognition  Overall Cognitive Status: Within Functional Limits for tasks assessed  Orientation Level: Oriented X4  Cognition  Arousal/Alertness: Awake/alert  Behavior During Therapy: WFL for tasks assessed/performed  Overall Cognitive Status: Within Functional Limits for tasks assessed  Area of Impairment: Orientation  Orientation Level: Time  Physical Exam:  Blood pressure 102/60, pulse 93, temperature 98.5 F (36.9 C), temperature source Oral, resp. rate 18, height _0  (1.803 m), weight 142.4 kg (313 lb 15 oz), last menstrual period 11/28/2013, SpO2 100.00%.  Physical Exam  Nursing note and vitals reviewed.  Constitutional: She is oriented to person, place, and time. She appears well-developed and well-nourished.  HENT:  Head: Normocephalic and atraumatic.  Eyes: Conjunctivae are normal. Pupils are equal, round, and reactive to light.  Neck: Normal range of motion.  Cardiovascular: Normal rate and regular rhythm.  No murmur heard.  Respiratory: Effort normal and breath sounds normal. No respiratory distress. She has no wheezes. She exhibits no tenderness (sternal area with radiation markings.).  GI: Soft. Bowel sounds are normal. She exhibits no distension. There is no tenderness.  Musculoskeletal: She exhibits no edema and no tenderness.  Neurological: She is alert and oriented to person, place, and time.  Speech clear. Follows commands without difficulty. Dense paraparesis with flexor spasms and few beats clonus on the right. Diminished sensation 1/2 below  Xiphoid.pain from gross touch.  Skin: Skin is warm and dry.  Intergluteal area chapped and abraded due to MAST. Liquid stool on underpad as well as rectal area. Inflamed skin tags with tenderness. Large foam dressing on sacrum.  Psychiatric: She has a normal mood and affect. Her behavior is normal. Judgment and thought content normal.  Motor strength is 5/5 bilateral deltoids, biceps, triceps, grip 0/5 bilateral hip flexors knee extensors ankle dorsiflexor plantar flexor Question trace hip adductors  Results for orders placed during the hospital encounter of 12/12/13 (from the past 48 hour(s))   GLUCOSE, CAPILLARY Status: Abnormal    Collection Time    01/17/14 4:12 PM   Result  Value  Ref Range    Glucose-Capillary  119 (*)  70 - 99 mg/dL   GLUCOSE, CAPILLARY Status: Abnormal  Collection Time    01/17/14 10:16 PM   Result  Value  Ref Range    Glucose-Capillary  106 (*)  70 - 99 mg/dL    Comment 1  Documented in Chart     Comment 2  Notify RN    GLUCOSE, CAPILLARY Status: Abnormal    Collection Time    01/18/14 7:11 AM   Result  Value  Ref Range    Glucose-Capillary  114 (*)  70 - 99 mg/dL   CBC Status: Abnormal    Collection Time    01/18/14 8:05 AM   Result  Value  Ref Range    WBC  6.6  4.0 - 10.5 K/uL    RBC  3.05 (*)  3.87 - 5.11 MIL/uL    Hemoglobin  8.9 (*)  12.0 - 15.0 g/dL    HCT  26.7 (*)  36.0 - 46.0 %    MCV  87.5  78.0 - 100.0 fL    MCH  29.2  26.0 - 34.0 pg    MCHC  33.3  30.0 - 36.0 g/dL    RDW  15.4  11.5 - 15.5 %    Platelets  340  150 - 400 K/uL   GLUCOSE, CAPILLARY Status: Abnormal    Collection Time    01/18/14 11:11 AM   Result  Value  Ref Range    Glucose-Capillary  108 (*)  70 - 99 mg/dL   GLUCOSE, CAPILLARY Status: Abnormal    Collection Time    01/18/14 4:29 PM   Result  Value  Ref Range    Glucose-Capillary  108 (*)  70 - 99 mg/dL   GLUCOSE, CAPILLARY Status: Abnormal    Collection Time    01/18/14 9:13 PM   Result  Value  Ref Range     Glucose-Capillary  111 (*)  70 - 99 mg/dL    Comment 1  Notify RN    CBC Status: Abnormal    Collection Time    01/19/14 12:07 AM   Result  Value  Ref Range    WBC  5.8  4.0 - 10.5 K/uL    RBC  3.18 (*)  3.87 - 5.11 MIL/uL    Hemoglobin  9.2 (*)  12.0 - 15.0 g/dL    HCT  27.7 (*)  36.0 - 46.0 %    MCV  87.1  78.0 - 100.0 fL    MCH  28.9  26.0 - 34.0 pg    MCHC  33.2  30.0 - 36.0 g/dL    RDW  15.0  11.5 - 15.5 %    Platelets  329  150 - 400 K/uL   BASIC METABOLIC PANEL Status: Abnormal    Collection Time    01/19/14 5:10 AM   Result  Value  Ref Range    Sodium  138  137 - 147 mEq/L    Potassium  3.4 (*)  3.7 - 5.3 mEq/L    Chloride  98  96 - 112 mEq/L    CO2  27  19 - 32 mEq/L    Glucose, Bld  91  70 - 99 mg/dL    BUN  10  6 - 23 mg/dL    Creatinine, Ser  0.66  0.50 - 1.10 mg/dL    Calcium  8.4  8.4 - 10.5 mg/dL    GFR calc non Af Amer  >90  >90 mL/min    GFR calc Af Amer  >90  >90 mL/min  Comment:  (NOTE)     The eGFR has been calculated using the CKD EPI equation.     This calculation has not been validated in all clinical situations.     eGFR's persistently <90 mL/min signify possible Chronic Kidney     Disease.    Anion gap  13  5 - 15   GLUCOSE, CAPILLARY Status: Abnormal    Collection Time    01/19/14 6:49 AM   Result  Value  Ref Range    Glucose-Capillary  114 (*)  70 - 99 mg/dL   GLUCOSE, CAPILLARY Status: Abnormal    Collection Time    01/19/14 11:14 AM   Result  Value  Ref Range    Glucose-Capillary  135 (*)  70 - 99 mg/dL    No results found.  Medical Problem List and Plan:  1. 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  2. DVT Prophylaxis/Anticoagulation: Pharmaceutical: Lovenox-will change to Xarelto per Hem/Onc input.  3. Pain Management: Denies back pain. But will need medication to help manage spacticity.  4. Mood: Reports problems sleeping due to current situation  and prn ativan ineffective. She is willing to start low dose antidepressant. Will have chaplin follow for spiritual support as well as LCSW to follow for evaluation and support.  5. Neuropsych: This patient is capable of making decisions on her own behalf.  6. Skin/Wound Care: Air mattress overlay for prevention of break down. Start educating patient and family on need for pressure relief measures.  7. Fluids/Electrolytes/Nutrition: Monitor I/O. Offer supplements if intake poor. Routine check of lytes.  8. Neurogenic bladder: Will change out foley today--continue for now.  9. Neurogenic bowel: Will check KUB for baseline and start on bowel program.  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 For RAI at 6-8 weeks post op.  11. HTN: Will monitor every 8 hours. No medications as this time. Monitor for orthostatic changes once OOB.  12 PCOS? With hyperglycemia: Hgb A1c-6.7. Will modify diet to CM and have RD educate patient on restrictions.  13. E coli UTI--recurrent: continue Keflex D # 6/7.  14. Hemorrhoids: continue proctofoam cream bid in addition to good rectal hygiene to prevent irritation from stool.  Post Admission Physician Evaluation:  Functional deficits secondary to Epidural spinal tumor with T3 cord compression and resulting paraplegia/incomplete myelopathy.  1. [name] admitted to receive collaborative, interdisciplinary care between the physiatrist, rehab nursing staff, and therapy team. 2. Patient's level of medical complexity and substantial therapy needs in context of that medical necessity cannot be provided at a lesser intensity of care such as a SNF. 3. Patient has experienced substantial functional loss from his/her baseline which was documented above under the "Functional History" and "Functional Status" headings. Judging by the patient's diagnosis, physical exam, and functional history, the patient has potential for functional progress which will result  in measurable gains while on inpatient rehab. These gains will be of substantial and practical use upon discharge in facilitating mobility and self-care at the household level. 4. Physiatrist will provide 24 hour management of medical needs as well as oversight of the therapy plan/treatment and provide guidance as appropriate regarding the interaction of the two. 5. 24 hour rehab nursing will assist with bladder management, bowel management, safety, skin/wound care, disease management, medication administration, pain management and patient education and help integrate therapy concepts, techniques,education, etc. 6. PT will assess and treat for/with: pre gait, gait training, endurance ,  safety, equipment, neuromuscular re education. Goals are: Modified independent wheelchair level. 7. OT will assess and treat for/with: ADLs, Cognitive perceptual skills, Neuromuscular re education, safety, endurance, equipment. Goals are: Modified independent wheelchair level. Therapy may proceed with showering this patient. 8. SLP will assess and treat for/with: NA. Goals are: NA. 9. Case Management and Social Worker will assess and treat for psychological issues and discharge planning. 10. Team conference will be held weekly to assess progress toward goals and to determine barriers to discharge. 11. Patient will receive at least 3 hours of therapy per day at least 5 days per week. 12. ELOS: 18-22D  13. Prognosis: good [name] M.D. [facility] FAAPM&R (Sports Med, Neuromuscular Med) Diplomate Am Board of Electrodiagnostic Med    01/19/2014

## 2014-01-24 NOTE — Progress Notes (Signed)
Physical Therapy Session Note  Patient Details  Name: Sarah [name] MRN: [id number] Date of Birth: 11/26/1965  Today's Date: 01/24/2014 PT Individual Time: 0800-0900 PT Individual Time Calculation (min): 60 min   Session 2 Time: 1500-1605 Time Calculation (min): 65 min  Short Term Goals: Week 1:  PT Short Term Goal 1 (Week 1): Pt will roll R and L in bed without rail req min A.  PT Short Term Goal 2 (Week 1): Pt will demonstrate supine to sit transfer with rail and 1 person assist.  PT Short Term Goal 3 (Week 1): Pt will scoot transfer bed to/from w/c with slideboard and 1 person assist PT Short Term Goal 4 (Week 1): Pt will initiate w/c propulsion.  PT Short Term Goal 5 (Week 1): Pt will tolerate dynamic sit balance req mod A.   Skilled Therapeutic Interventions/Progress Updates:    Session 1: Co-tx w/ RT Sarah [name] for safety. Pt received supine in bed, agreeable to participate in therapy after eating breakfast. Pt reported she did not have an appetite for anything on her tray, so PT retrieved vanilla yogurt from nurse's station for pt to eat. Pt overall +2A for all bed mobility, +3A for sliding board transfer bed>w/c w/ extra assist for steadying sliding board. +2A for SBT w/c>bed. PT Sarah [name] and PT Sarah [name] arrived to discuss wheelchair management. Pt able to tolerate sitting EOB for ~10 minutes. Pt left supine in bed in preparation for OT arrival.  Session 2: Pt received handed off from OT in rehab gym, agreeable to participate in therapy. Pt sat at edge of mat for 40 minutes, maintained static sitting balance w/ SBA-CGA. Pt w/ multiple reaching activities for weighted ball w/ single UE at a time, reaching for basketball/yellow theraball at midline w/ BUE, reaching for therapist hands at midline. Reaching for cards to L/R and sliding cards under hips to promote pressure relief. Therapist provided min resistance in alternating isometrics in sitting for increased trunk muscle activation. Pt continues  to be very fearful of anterior, lateral weight shift due to fear of falling. +2A for sliding board transfer mat>w/c. In room sling placed under patient for ease of transfer w/ nursing later. Pt left seated in w/c w/ chest belt on for safety and RN present.     Therapy Documentation Precautions:  Precautions Precautions: Fall, Back Precaution Comments: bil leg weakness, paraplegia Restrictions Weight Bearing Restrictions: No General:   Vital Signs: Therapy Vitals Temp: 97.9 F (36.6 C) Pulse Rate: 96 Resp: 16 BP: 111/75 mmHg Oxygen Therapy SpO2: 96 % O2 Device: Not Delivered Pain:   2/10 during AM session, unrated during PM session but intermittent back pain w/ mobility, provided backrub and rest. Mobility:   Locomotion :    Trunk/Postural Assessment :    Balance:   Exercises:   Other Treatments:    See FIM for current functional status  Therapy/Group: Individual Therapy  Rada Hay  Rada Hay, PT, DPT 01/24/2014, 7:45 AM

## 2014-01-24 NOTE — Progress Notes (Signed)
Occupational Therapy Session Note  Patient Details  Name: Sarah [name] MRN: [id number] Date of Birth: 06-23-1965  Today's Date: 01/24/2014 OT Individual Time: 1405-1500 OT Individual Time Calculation (min): 55 min    Skilled Therapeutic Interventions/Progress Updates:    Pt began session working on rolling side to side in bed for therapy to assist with donning new brief as well LB clothing.  She needed max assist for rolling to both the left and right sides as she could not initiate any hip/knee flexion.  Noted increased extensor tone when therapist assisted with bending up each LE to assist with rolling.  Total assist needed for donning clothing over her feet as well as pulling up over hips.  She was able to state 2/3 precautions when asked but was unable to state "no arching".   Total assist for transition to sitting from sidelying on the the left side.  Total +2 for positioning of sliding board and performing sliding board transfer to the tilt in space wheelchair.  She was able to transfer to the mat in the gym with the sliding board as well with +2 (pt less than 20%).  Pt with increased fear of falling with all transitional movements.  She was able to maintain static sitting EOB with bilateral UE support and min guard assist.  Worked briefly with pt on dynamic sitting balance having her release each UE to perform reaching tasks.  Min assist needed for balance when attempting to release the RUE to reach for therapist's hand.  Supervision when using the left.  Pt left on therapy mat in sitting with PT present to continue.   Therapy Documentation Precautions:  Precautions Precautions: Fall, Back Precaution Comments: bil leg weakness, paraplegia Restrictions Weight Bearing Restrictions: No  Pain: Pain Assessment Pain Assessment: Faces Pain Score: 1  Faces Pain Scale: Hurts a little bit Pain Type: Acute pain Pain Location: Back Pain Orientation: Lower Pain Intervention(s): Medication  (See eMAR);Repositioned ADL: See FIM for current functional status  Therapy/Group: Individual Therapy  Vanya Carberry OTR/L 01/24/2014, 3:34 PM

## 2014-01-24 NOTE — Progress Notes (Signed)
48 y.o. female with history of HTN, chronic back pain, morbid obesity who was admitted on 12/10/13 with BLE weakness, inability to walk as well as problems with urinary retention and constipation. Work up revealed a lesion at the T3 level of the upper thoracic spine with pathologic compression fracture and epidural tumor resulting in moderate cord compression with evidence of mild spinal cord edema, as well as right suprahilar mass with pulmonary nodules with mediastinal lymph nodes c/w metastatic disease, RUL & RLL PE, tubular cystic masses bilateral adnexal regions and a massive thyroid goiter causing tracheal deviation to the right with narrowing of trachea. She was started on steriods and foley placed for urinary retention. She underwent thoracic laminectomy with excision of tumor on 09/23 by Dr. Christella Noa and on bedrest pending further surgery. Pathology favoring metastatic adenocarcinoma of lung but metastatic thyroid cancer in differential . 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 Buddy Duty consulted for input. 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 Patient underwent T1-T5 posterolateral arthrodesis with allograft on 10/09 with plans XRT to be initiated 2 weeks post op. Dr. Constance Holster consulted for input and patient underwent total thyroidectomy on 10/19 and extubated without difficulty on 10/20.  Subjective/Complaints: Progressing with therapy. Anxiety at times Some resultsObjective: Vital Signs: Blood pressure 111/75, pulse 96, temperature 97.9 F (36.6 C), temperature source Oral, resp. rate 16, height 5\' 11"  (1.803 m), weight 136 kg (299 lb 13.2 oz), last menstrual period 12/06/2013, SpO2 96 %. No results found. Results for orders placed or performed during the hospital encounter of 01/19/14 (from the past 72 hour(s))  Glucose, capillary     Status: Abnormal   Collection Time: 01/21/14 11:48 AM   Result Value Ref Range   Glucose-Capillary 117 (H) 70 - 99 mg/dL   Comment 1 Notify RN   Glucose, capillary     Status: Abnormal   Collection Time: 01/21/14  4:50 PM  Result Value Ref Range   Glucose-Capillary 114 (H) 70 - 99 mg/dL   Comment 1 Notify RN   Glucose, capillary     Status: Abnormal   Collection Time: 01/21/14  8:51 PM  Result Value Ref Range   Glucose-Capillary 115 (H) 70 - 99 mg/dL  Glucose, capillary     Status: Abnormal   Collection Time: 01/22/14  7:09 AM  Result Value Ref Range   Glucose-Capillary 114 (H) 70 - 99 mg/dL  Glucose, capillary     Status: Abnormal   Collection Time: 01/22/14 11:40 AM  Result Value Ref Range   Glucose-Capillary 125 (H) 70 - 99 mg/dL   Comment 1 Notify RN   Glucose, capillary     Status: Abnormal   Collection Time: 01/22/14  4:16 PM  Result Value Ref Range   Glucose-Capillary 136 (H) 70 - 99 mg/dL   Comment 1 Notify RN   Glucose, capillary     Status: Abnormal   Collection Time: 01/22/14  9:09 PM  Result Value Ref Range   Glucose-Capillary 140 (H) 70 - 99 mg/dL  Glucose, capillary     Status: Abnormal   Collection Time: 01/23/14  7:00 AM  Result Value Ref Range   Glucose-Capillary 125 (H) 70 - 99 mg/dL  Glucose, capillary     Status: Abnormal   Collection Time: 01/23/14 11:52 AM  Result Value Ref Range   Glucose-Capillary 139 (H) 70 - 99 mg/dL  Glucose, capillary     Status: Abnormal  Collection Time: 01/23/14  4:15 PM  Result Value Ref Range   Glucose-Capillary 144 (H) 70 - 99 mg/dL  Glucose, capillary     Status: Abnormal   Collection Time: 01/23/14  8:57 PM  Result Value Ref Range   Glucose-Capillary 145 (H) 70 - 99 mg/dL  Glucose, capillary     Status: Abnormal   Collection Time: 01/24/14  6:36 AM  Result Value Ref Range   Glucose-Capillary 123 (H) 70 - 99 mg/dL      Nursing note and vitals reviewed.  Constitutional: She is oriented to person, place, and time. She appears well-developed and well-nourished.   HENT:  Head: Normocephalic and atraumatic.  Eyes: Conjunctivae are normal. Pupils are equal, round, and reactive to light.  Neck: Normal range of motion.  Cardiovascular: Normal rate and regular rhythm.  No murmur heard.  Respiratory: Effort normal and breath sounds normal. No respiratory distress. She has no wheezes. She exhibits no tenderness (sternal area with radiation markings.).  GI: Soft. Bowel sounds are normal. She exhibits no distension. There is no tenderness.  Musculoskeletal: She exhibits no edema and no tenderness.  Neurological: She is alert and oriented to person, place, and time.   Dense paraparesis with flexor spasms and few beats clonus on the right. Diminished sensation 1/2 below Xiphoid.pain from gross touch.  Skin: Skin is warm and dry.  Intergluteal area chapped. Inflamed skin tags with tenderness. Large foam dressing on sacrum.  Psychiatric: She has a normal mood and affect. Her behavior is normal. Judgment and thought content normal.  Motor strength is 5/5 bilateral deltoids, biceps, triceps, grip  0/5 bilateral hip flexors knee extensors ankle dorsiflexor plantar flexor   Assessment/Plan: 1. Functional deficits secondary to Paraplegia which require 3+ hours per day of interdisciplinary therapy in a comprehensive inpatient rehab setting. Physiatrist is providing close team supervision and 24 hour management of active medical problems listed below. Physiatrist and rehab team continue to assess barriers to discharge/monitor patient progress toward functional and medical goals. FIM: FIM - Bathing Bathing Steps Patient Completed: Chest, Right Arm, Left Arm, Abdomen, Right upper leg, Left upper leg Bathing: 3: Mod-Patient completes 5-7 36f 10 parts or 50-74%  FIM - Upper Body Dressing/Undressing Upper body dressing/undressing steps patient completed: Thread/unthread right sleeve of pullover shirt/dresss, Thread/unthread left sleeve of pullover shirt/dress, Put head  through opening of pull over shirt/dress Upper body dressing/undressing: 1: Two helpers (done edge of bed) FIM - Lower Body Dressing/Undressing Lower body dressing/undressing: 1: Total-Patient completed less than 25% of tasks  FIM - Toileting Toileting: 0: Activity did not occur  FIM - Air cabin crew Transfers: 0-Activity did not occur  FIM - Control and instrumentation engineer Devices: Bed rails, Sliding board, Arm rests Bed/Chair Transfer: 1: Two helpers, 1: Supine > Sit: Total A (helper does all/Pt. < 25%), 1: Sit > Supine: Total A (helper does all/Pt. < 25%), 1: Bed > Chair or W/C: Total A (helper does all/Pt. < 25%), 1: Chair or W/C > Bed: Total A (helper does all/Pt. < 25%)  FIM - Locomotion: Wheelchair Locomotion: Wheelchair: 0: Activity did not occur FIM - Locomotion: Ambulation Locomotion: Ambulation: 0: Activity did not occur  Comprehension Comprehension Mode: Auditory Comprehension: 6-Follows complex conversation/direction: With extra time/assistive device  Expression Expression Mode: Verbal Expression: 6-Expresses complex ideas: With extra time/assistive device  Social Interaction Social Interaction: 5-Interacts appropriately 90% of the time - Needs monitoring or encouragement for participation or interaction.  Problem Solving Problem Solving: 5-Solves basic 90% of  the time/requires cueing < 10% of the time  Memory Memory: 6-More than reasonable amt of time  Medical Problem List and Plan:  1. 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  2. DVT Prophylaxis/Anticoagulation: Pharmaceutical: Xarelto per Hem/Onc input.  3. Pain Management: Denies back pain. But will need medication to help manage spacticity.  4. Mood:prn ativan for anxiety, ?antidepressant. SW/Neuropsych assessment. Ego support by team.   5. Neuropsych: This patient is capable of making decisions  on her own behalf.  6. Skin/Wound Care: Air mattress overlay for prevention of break down. Start educating patient and family on need for pressure relief measures.  7. Fluids/Electrolytes/Nutrition: encourage po intake.[name] Kitchen Routine check of lytes.  8. Neurogenic bladder: foley 9. 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 For RAI at 6-8 weeks post op.  11. HTN: Will monitor every 8 hours. No medications as this time. Monitor for orthostatic changes once OOB.  12 PCOS? With hyperglycemia: Hgb A1c-6.7. [name] Kitchen   -sugars reasonable 13. E coli UTI--keflex complete 14. Hemorrhoids: continue proctofoam cream bid in addition to good rectal hygiene to prevent irritation from stool.    LOS (Days) 5 A FACE TO FACE EVALUATION WAS PERFORMED  Brayan Votaw T 01/24/2014, 7:25 AM

## 2014-01-24 NOTE — Plan of Care (Signed)
Problem: RH Tub/Shower Transfers Goal: LTG Patient will perform tub/shower transfers w/assist (OT) LTG: Patient will perform tub/shower transfers with assist, with/without cues using equipment (OT)  Outcome: Not Progressing

## 2014-01-25 ENCOUNTER — Ambulatory Visit
Admit: 2014-01-25 | Discharge: 2014-01-25 | Disposition: A | Discharge status: Home / Self Care | Payer: Medicaid Other | Attending: Radiation Oncology | Admitting: Radiation Oncology

## 2014-01-25 ENCOUNTER — Ambulatory Visit (HOSPITAL_COMMUNITY): Payer: Medicaid Other | Admitting: *Deleted

## 2014-01-25 ENCOUNTER — Inpatient Hospital Stay (HOSPITAL_COMMUNITY): Payer: Medicaid Other | Admitting: Occupational Therapy

## 2014-01-25 ENCOUNTER — Encounter (HOSPITAL_COMMUNITY): Payer: Medicaid Other | Admitting: Occupational Therapy

## 2014-01-25 ENCOUNTER — Inpatient Hospital Stay (HOSPITAL_COMMUNITY): Payer: Medicaid Other | Admitting: Physical Therapy

## 2014-01-25 LAB — GLUCOSE, CAPILLARY
GLUCOSE-CAPILLARY: 150 mg/dL — AB (ref 70–99)
Glucose-Capillary: 130 mg/dL — ABNORMAL HIGH (ref 70–99)
Glucose-Capillary: 142 mg/dL — ABNORMAL HIGH (ref 70–99)
Glucose-Capillary: 124 mg/dL — ABNORMAL HIGH (ref 70–99)

## 2014-01-25 MED ORDER — BACLOFEN 10 MG PO TABS
10.0000 mg | ORAL_TABLET | Freq: Two times a day (BID) | ORAL | Status: DC
  Administered 2014-01-25 – 2014-01-31 (×13): 10 mg via ORAL
  Filled 2014-01-25 (×16): qty 1

## 2014-01-25 NOTE — Progress Notes (Signed)
Recreational Therapy Session Note  Patient Details  Name: [name] MRN: [id number] Date of Birth: February 20, 1966 Today's Date: 01/25/2014  Pain: 3/10 back pain, repositioning offered relief Skilled Therapeutic Interventions/Progress Updates: Session focused on activity tolerance, dynamic sitting balance with 1 UE support, slide board transfers, overall safety with functional mobility.  Pt performed slide board transfers with +2 assist from w/c<-->mat.  Pt sat EOM for ball tap activity starting with 1 UE, then the other, and lastly alternating.  Pt required contact guard assist.  Pt able to correct minor LOB with activity.  Discussion with pt about potential activities for participation with family post discharge with min questioning cues.  Therapy/Group: Co-Treatment  Annaston Upham 01/25/2014, 5:26 PM

## 2014-01-25 NOTE — Progress Notes (Signed)
Occupational Therapy Session Notes  Patient Details  Name: Sarah [name] MRN: [id number] Date of Birth: 09-Feb-1966  Today's Date: 01/25/2014  Short Term Goals: Week 1:  OT Short Term Goal 1 (Week 1): Pt will complete upper body bathing with setup assist OT Short Term Goal 2 (Week 1): Pt will rise from supine to sitting at edge of bed with mod assist to manage LE OT Short Term Goal 3 (Week 1): Pt will maintain supported sitting balance at edge of bed for 5 min during assisted lower body dressing OT Short Term Goal 4 (Week 1): Pt will complete lateral leans to pull up pants with mod assist OT Short Term Goal 5 (Week 1): Pt will complete grooming at w/c level with setup assist  Skilled Therapeutic Interventions/Progress Updates:  Session 1: OT Individual Time: 0881-1031 OT Calculated Time (min): 60 min Skilled OT session focusing on ADL retraining, functional t/f, dynamic sitting balance, bed mobility, and activity tolerance/endurance. Pt supine in bed w/ RN and nurse tech in room checking for BM when arriving, reporting 3/10 pain and had been administered morning medication. Pt performed LB dressing (as she had no pants on) by rolling L and R, therapist applied ted hose and socks. Pt t/f to EOB and sat a EOB to take 1-2 min rest break. Pt educated on sequence and body positioning for safe and efficient slideboard t/f and then pt t/f to w/c via slideboard w/ max A x 2. Pt brought to gym and discussed wanting to wash her hair during afternoon B&D session today. Pt seated in w/c in gym waiting for PT when leaving.  Session 2: OT Individual Time: 1410-1510 OT Calculated Time (min): 60 min Skilled OT session focusing on ADL retraining, bed mobility, UE strength and ROM, and activity tolerance/endurance. Pt supine in bed w/ nurse tech taking vitals when arriving, reporting slight pain in back but had just received medication. Pt completed UB B&D while supine in bed. Pt requesting to wash hair,  therapist provided shower cap and pt washed hair. Pt completed LB B&D while supine in bed, w/ rolling L & R. Pt reporting needing to have BM, but when brief was changed no BM noted. Pt completed tooth brushing and hair brushing while supine in bed. Pt supine in bed w/ all needs nearby when leaving.   Therapy Documentation Precautions:  Precautions Precautions: Fall, Back Precaution Comments: bil leg weakness, paraplegia Restrictions Weight Bearing Restrictions: No  See FIM for current functional status  Therapy/Group: Individual Therapy  Laurina Fischl Raynell 01/25/2014, 12:01 PM

## 2014-01-25 NOTE — Progress Notes (Signed)
48 y.o. female with history of HTN, chronic back pain, morbid obesity who was admitted on 12/10/13 with BLE weakness, inability to walk as well as problems with urinary retention and constipation. Work up revealed a lesion at the T3 level of the upper thoracic spine with pathologic compression fracture and epidural tumor resulting in moderate cord compression with evidence of mild spinal cord edema, as well as right suprahilar mass with pulmonary nodules with mediastinal lymph nodes c/w metastatic disease, RUL & RLL PE, tubular cystic masses bilateral adnexal regions and a massive thyroid goiter causing tracheal deviation to the right with narrowing of trachea. She was started on steriods and foley placed for urinary retention. She underwent thoracic laminectomy with excision of tumor on 09/23 by Dr. Christella Noa and on bedrest pending further surgery. Pathology favoring metastatic adenocarcinoma of lung but metastatic thyroid cancer in differential . 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 Buddy Duty consulted for input. 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 Patient underwent T1-T5 posterolateral arthrodesis with allograft on 10/09 with plans XRT to be initiated 2 weeks post op. Dr. Constance Holster consulted for input and patient underwent total thyroidectomy on 10/19 and extubated without difficulty on 10/20.  Subjective/Complaints: Progressing with therapy. Anxiety at times Some resultsObjective: Vital Signs: Blood pressure 105/76, pulse 93, temperature 98.2 F (36.8 C), temperature source Oral, resp. rate 18, height 5\' 11"  (1.803 m), weight 136 kg (299 lb 13.2 oz), last menstrual period 12/06/2013, SpO2 96 %. No results found. Results for orders placed or performed during the hospital encounter of 01/19/14 (from the past 72 hour(s))  Glucose, capillary     Status: Abnormal   Collection Time: 01/22/14 11:40 AM   Result Value Ref Range   Glucose-Capillary 125 (H) 70 - 99 mg/dL   Comment 1 Notify RN   Glucose, capillary     Status: Abnormal   Collection Time: 01/22/14  4:16 PM  Result Value Ref Range   Glucose-Capillary 136 (H) 70 - 99 mg/dL   Comment 1 Notify RN   Glucose, capillary     Status: Abnormal   Collection Time: 01/22/14  9:09 PM  Result Value Ref Range   Glucose-Capillary 140 (H) 70 - 99 mg/dL  Glucose, capillary     Status: Abnormal   Collection Time: 01/23/14  7:00 AM  Result Value Ref Range   Glucose-Capillary 125 (H) 70 - 99 mg/dL  Glucose, capillary     Status: Abnormal   Collection Time: 01/23/14 11:52 AM  Result Value Ref Range   Glucose-Capillary 139 (H) 70 - 99 mg/dL  Glucose, capillary     Status: Abnormal   Collection Time: 01/23/14  4:15 PM  Result Value Ref Range   Glucose-Capillary 144 (H) 70 - 99 mg/dL  Glucose, capillary     Status: Abnormal   Collection Time: 01/23/14  8:57 PM  Result Value Ref Range   Glucose-Capillary 145 (H) 70 - 99 mg/dL  Glucose, capillary     Status: Abnormal   Collection Time: 01/24/14  6:36 AM  Result Value Ref Range   Glucose-Capillary 123 (H) 70 - 99 mg/dL  Glucose, capillary     Status: Abnormal   Collection Time: 01/24/14 11:28 AM  Result Value Ref Range   Glucose-Capillary 159 (H) 70 - 99 mg/dL  Glucose, capillary     Status: Abnormal   Collection Time: 01/24/14  4:55 PM  Result Value Ref Range  Glucose-Capillary 161 (H) 70 - 99 mg/dL  Glucose, capillary     Status: Abnormal   Collection Time: 01/24/14 10:21 PM  Result Value Ref Range   Glucose-Capillary 142 (H) 70 - 99 mg/dL  Glucose, capillary     Status: Abnormal   Collection Time: 01/25/14  7:11 AM  Result Value Ref Range   Glucose-Capillary 130 (H) 70 - 99 mg/dL      Nursing note and vitals reviewed.  Constitutional: She is oriented to person, place, and time. She appears well-developed and well-nourished.  HENT:  Head: Normocephalic and atraumatic.  Eyes:  Conjunctivae are normal. Pupils are equal, round, and reactive to light.  Neck: Normal range of motion.  Cardiovascular: Normal rate and regular rhythm.  No murmur heard.  Respiratory: Effort normal and breath sounds normal. No respiratory distress. She has no wheezes. She exhibits no tenderness (sternal area with radiation markings.).  GI: Soft. Bowel sounds are normal. She exhibits no distension. There is no tenderness.  Musculoskeletal: She exhibits no edema and no tenderness.  Neurological: She is alert and oriented to person, place, and time.   Dense paraparesis with flexor spasms and few beats clonus on the right. Diminished sensation 1/2 below Xiphoid.pain from gross touch.  Skin: Skin is warm and dry.  Intergluteal area chapped. Inflamed skin tags with tenderness. Large foam dressing still on sacrum.  Psychiatric: She has a normal mood and affect. Her behavior is normal. Judgment and thought content normal.  Motor strength is 5/5 bilateral deltoids, biceps, triceps, grip  0/5 bilateral hip flexors knee extensors ankle dorsiflexor plantar flexor Tone 2/4 left greater than right LE's   Assessment/Plan: 1. Functional deficits secondary to Paraplegia which require 3+ hours per day of interdisciplinary therapy in a comprehensive inpatient rehab setting. Physiatrist is providing close team supervision and 24 hour management of active medical problems listed below. Physiatrist and rehab team continue to assess barriers to discharge/monitor patient progress toward functional and medical goals.  Discussed with patient that her family needs to be in to see what her needs are and to begin the educational process.  FIM: FIM - Bathing Bathing Steps Patient Completed: Chest, Right Arm, Left Arm, Abdomen, Right upper leg, Left upper leg Bathing: 3: Mod-Patient completes 5-7 42f 10 parts or 50-74%  FIM - Upper Body Dressing/Undressing Upper body dressing/undressing steps patient completed:  Thread/unthread right sleeve of pullover shirt/dresss, Thread/unthread left sleeve of pullover shirt/dress, Put head through opening of pull over shirt/dress Upper body dressing/undressing: 1: Two helpers (done edge of bed) FIM - Lower Body Dressing/Undressing Lower body dressing/undressing: 1: Two helpers  FIM - Toileting Toileting: 0: Activity did not occur  FIM - Air cabin crew Transfers: 0-Activity did not occur  FIM - Control and instrumentation engineer Devices: Sliding board, Bed rails Bed/Chair Transfer: 1: Two helpers, 1: Mechanical lift  FIM - Locomotion: Wheelchair Locomotion: Wheelchair: 1: Total Assistance/staff pushes wheelchair (Pt<25%) FIM - Locomotion: Ambulation Locomotion: Ambulation: 0: Activity did not occur  Comprehension Comprehension Mode: Auditory Comprehension: 6-Follows complex conversation/direction: With extra time/assistive device  Expression Expression Mode: Verbal Expression: 6-Expresses complex ideas: With extra time/assistive device  Social Interaction Social Interaction: 6-Interacts appropriately with others with medication or extra time (anti-anxiety, antidepressant).  Problem Solving Problem Solving: 6-Solves complex problems: With extra time  Memory Memory: 7-Complete Independence: No helper  Medical Problem List and Plan:  1. 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  2. DVT Prophylaxis/Anticoagulation: Pharmaceutical:  Xarelto per Hem/Onc input.  3. Pain Management: Denies back pain. But will need medication to help manage spacticity.  4. Mood:prn ativan for anxiety, ?antidepressant. SW/Neuropsych assessment. Ego support by team.    -pt aware of situational anxiety with therapy 5. Neuropsych: This patient is capable of making decisions on her own behalf.  6. Skin/Wound Care: Air mattress overlay for prevention of break down. Start  educating patient and family on need for pressure relief measures.  7. Fluids/Electrolytes/Nutrition: encourage po intake.[name] Kitchen Routine check of lytes.  8. Neurogenic bladder: foley 9. 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 For RAI at 6-8 weeks post op.  11. HTN: Will monitor every 8 hours. No medications as this time. Monitor for orthostatic changes once OOB.  12 PCOS? With hyperglycemia: Hgb A1c-6.7. [name] Kitchen   -sugars reasonable 13. E coli UTI--keflex complete 14. Hemorrhoids: continue proctofoam cream bid in addition to good rectal hygiene to prevent irritation from stool.  15. Spasticity: baclofen trial.   LOS (Days) 6 A FACE TO FACE EVALUATION WAS PERFORMED  SWARTZ,ZACHARY T 01/25/2014, 7:43 AM

## 2014-01-25 NOTE — Progress Notes (Signed)
Physical Therapy Session Note  Patient Details  Name: Sarah [name] MRN: [id number] Date of Birth: 07/28/1965  Today's Date: 01/25/2014 PT Individual Time:Treatment Session 1: [id number] Treatment Session 2: 1510-1610  PT Individual Time Calculation (min): 60 min Treatment Session 2: 60 min   Short Term Goals: Week 1:  PT Short Term Goal 1 (Week 1): Pt will roll R and L in bed without rail req min A.  PT Short Term Goal 2 (Week 1): Pt will demonstrate supine to sit transfer with rail and 1 person assist.  PT Short Term Goal 3 (Week 1): Pt will scoot transfer bed to/from w/c with slideboard and 1 person assist PT Short Term Goal 4 (Week 1): Pt will initiate w/c propulsion.  PT Short Term Goal 5 (Week 1): Pt will tolerate dynamic sit balance req mod A.   Skilled Therapeutic Interventions/Progress Updates:  Low step under pt's feet during scoot and mat activity for improved foot support during AM session.     Therapeutic Activity: PT instructs pt in scoot transfer with slideboard to R from w/c to mat req + 2 assist and verbal cues for head-hips relationship. Pt assists with removing slideboard.  PT instructs pt in scoot transfer laterally on edge of mat to L req + 2 assist, with head-hips relatiohnship improving with increased number of scoots and pt confidence in leaning forward. PT instructs pt in scooting L hip back on mat req verbal cues for hand placement, head-hips relationship, and specifically pushing with L arm req + 2 assist. Pt c/o bottom pain and so PT and rec therapist reposition pt into side lie on mat req + 2 assist - pt leans onto L forearm with verbal and visual cues.  PT instructs pt in rolling from L side lie to R side lie and back req mod A for B LEs in hook lie.  PT instructs pt in L side lie to sit transfer on mat req min A. PT and Rec therapist provide dependent transfer from [place] w/c to bed with Maxi Move. Pt left in bed with all 4 rails up and call light in reach.    Neuromuscular Reeducation: PT instructs pt in dynamic sit balance holding cup of water req CGA-close SBA on edge of mat.  PT instructs pt in beachball toss with rec therapist while PT provides very close SBA for safety while pt on edge of mat with B foot support  - ball toss to pt's center with unilateral arm tap back, then alternating arms, then progressing to further away from pt's center of mass. Pt able to self correct all minor LOB.   Therapeutic Exercise: PT instructs pt in PROM (possible AAROM) to L LE in side lie: clam shells x 5 reps, gravity minimized LAQ x 5 reps.   Treatment Session 2: W/C Management: PT transferred pt into 24" wide HD manual w/c and PT instructs pt in donning/doffing brakes and w/c propulsion in a controlled environment on a level surface x 65' + 55' + 65' req CGA for guarding of the trunk, progressing to SBA as pt becomes used to this activity.  PT instructs pt in reaching for legrest release lever and pt demonstrates ability to release B legrests with verbal cues for location.   Therapeutic Activity: PT instructs pt in scoot transfer with slideboard w/c to mat req +2 assist - pt very fatigued by the end of the day and req max cues for head-hips relationship and hand placement.  PT instructs pt  in sit to side lie transfer on mat req +2 assist due to fatigue.  PT instructs pt in rolling L and R on mat req mod A for B LEs in order to place Maxi Move sling.  Pt req +2 assist for dependent Maxi Move lift transfer from mat to TIS w/c. Pt left tilted back 30 degrees up in w/c in room with all needs in reach and RN notified of pt's position. Pt agrees to call for RN when she is ready to go back to bed.   Pt is slowly gaining more confidence in transfers and ability to perform head-hips relationship during transfers is improving, as well. Pt's dynamic sitting balance has progressed to close SBA with minimal excursions away from center of mass. Pt is very fatigued by PM  session, but demonstrates the ability to initiate w/c propulsion on level surface in 24" wide HD manual w/c. Cont per PT POC.   Therapy Documentation Precautions:  Precautions Precautions: Fall, Back Precaution Comments: bil leg weakness, paraplegia Restrictions Weight Bearing Restrictions: No Pain: Pain Assessment Pain Assessment: 0-10 Pain Score: 3  Pain Type: Acute pain Pain Location: Back Pain Orientation: Right;Left;Lateral (pressure from back against push canes of w/c) Pain Descriptors / Indicators: Aching Pain Frequency: Constant Pain Onset: Gradual Patients Stated Pain Goal: 3 Pain Intervention(s): Repositioned Multiple Pain Sites: No   See FIM for current functional status  Therapy/Group: Individual Therapy with Recreational Therapist Lisa in AM and PT Tech Kelvin in PM  [facility] M 01/25/2014, 9:32 AM

## 2014-01-26 ENCOUNTER — Inpatient Hospital Stay (HOSPITAL_COMMUNITY): Payer: Medicaid Other

## 2014-01-26 ENCOUNTER — Encounter (HOSPITAL_COMMUNITY): Payer: Medicaid Other

## 2014-01-26 ENCOUNTER — Encounter (HOSPITAL_COMMUNITY): Payer: Medicaid Other | Admitting: Occupational Therapy

## 2014-01-26 ENCOUNTER — Encounter: Payer: Self-pay | Admitting: Radiation Oncology

## 2014-01-26 ENCOUNTER — Ambulatory Visit
Admit: 2014-01-26 | Discharge: 2014-01-26 | Disposition: A | Discharge status: Home / Self Care | Payer: Medicaid Other | Attending: Radiation Oncology | Admitting: Radiation Oncology

## 2014-01-26 DIAGNOSIS — C73 Malignant neoplasm of thyroid gland: Secondary | ICD-10-CM

## 2014-01-26 DIAGNOSIS — C7951 Secondary malignant neoplasm of bone: Secondary | ICD-10-CM

## 2014-01-26 LAB — GLUCOSE, CAPILLARY
GLUCOSE-CAPILLARY: 112 mg/dL — AB (ref 70–99)
GLUCOSE-CAPILLARY: 129 mg/dL — AB (ref 70–99)
Glucose-Capillary: 151 mg/dL — ABNORMAL HIGH (ref 70–99)
Glucose-Capillary: 177 mg/dL — ABNORMAL HIGH (ref 70–99)

## 2014-01-26 MED ORDER — POTASSIUM CHLORIDE CRYS ER 20 MEQ PO TBCR
20.0000 meq | EXTENDED_RELEASE_TABLET | Freq: Every day | ORAL | Status: AC
  Administered 2014-01-26 – 2014-01-28 (×3): 20 meq via ORAL
  Filled 2014-01-26 (×3): qty 1

## 2014-01-26 NOTE — Progress Notes (Signed)
Occupational Therapy Session Note  Patient Details  Name: Sarah [name] MRN: [id number] Date of Birth: 16-Feb-1966  Today's Date: 01/26/2014 OT Individual Time: 0727-0829 OT Individual Time Calculation (min): 62 min    Short Term Goals: Week 1:  OT Short Term Goal 1 (Week 1): Pt will complete upper body bathing with setup assist OT Short Term Goal 2 (Week 1): Pt will rise from supine to sitting at edge of bed with mod assist to manage LE OT Short Term Goal 3 (Week 1): Pt will maintain supported sitting balance at edge of bed for 5 min during assisted lower body dressing OT Short Term Goal 4 (Week 1): Pt will complete lateral leans to pull up pants with mod assist OT Short Term Goal 5 (Week 1): Pt will complete grooming at w/c level with setup assist  Skilled Therapeutic Interventions/Progress Updates: ADL-retraining with focus on bed mobility, discharge planning and re-ed on goals of treatment.   Pt received supine in bed, room darkened and pt asleep.   With mod multimodal cues, pt aroused to receive breakfast.   OT educated pt on benefit of upright posture while self-feeding and pt initially agreed to attempt self-feeding with OT providing assist with maintaining sitting balance at edge of bed.   During transition to upright sitting pt reported onset of bed and required total assist for hygiene.   During routine hygiene, pt advised need for RN to provide application of treatment to inflamed hemmorrhoids, which delayed progress with self-care.   OT revised treatment to engage pt with lift transfer to tilt-in-space w/c for continuation of self-feeding and discussion on need for family training and change in routine.   Pt was advised of plan to perform assisted bathing at night to allow more time for therapies to address deficits with sitting balance and transfers as priority for discharge home.  Patient has achieved goal of setup assist with bathing and dressing upper body but requires extra time to  learn lateral leans and use of AE to improve lower body bathing and dressing skills.   Pt transferred to w/c using maximove and setup for self-feeding.   All needs placed within reach.  Therapy Documentation Precautions:  Precautions Precautions: Fall, Back Precaution Comments: bil leg weakness, paraplegia Restrictions Weight Bearing Restrictions: No  Pain: Pain Assessment Pain Assessment: 0-10 Pain Score: 2    ADL: ADL ADL Comments: see FIM  See FIM for current functional status  Therapy/Group: Individual Therapy  Irven Ingalsbe 01/26/2014, 12:55 PM

## 2014-01-26 NOTE — Progress Notes (Signed)
48 y.o. female with history of HTN, chronic back pain, morbid obesity who was admitted on 12/10/13 with BLE weakness, inability to walk as well as problems with urinary retention and constipation. Work up revealed a lesion at the T3 level of the upper thoracic spine with pathologic compression fracture and epidural tumor resulting in moderate cord compression with evidence of mild spinal cord edema, as well as right suprahilar mass with pulmonary nodules with mediastinal lymph nodes c/w metastatic disease, RUL & RLL PE, tubular cystic masses bilateral adnexal regions and a massive thyroid goiter causing tracheal deviation to the right with narrowing of trachea. She was started on steriods and foley placed for urinary retention. She underwent thoracic laminectomy with excision of tumor on 09/23 by Dr. Christella Noa and on bedrest pending further surgery. Pathology favoring metastatic adenocarcinoma of lung but metastatic thyroid cancer in differential . 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 Buddy Duty consulted for input. 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 Patient underwent T1-T5 posterolateral arthrodesis with allograft on 10/09 with plans XRT to be initiated 2 weeks post op. Dr. Constance Holster consulted for input and patient underwent total thyroidectomy on 10/19 and extubated without difficulty on 10/20.  Subjective/Complaints: No new issues. Pain under control. Up with therapy already this am.  Vital Signs: Blood pressure 119/55, pulse 97, temperature 98.3 F (36.8 C), temperature source Oral, resp. rate 18, height 5\' 11"  (1.803 m), weight 128 kg (282 lb 3 oz), last menstrual period 12/06/2013, SpO2 95 %. No results found. Results for orders placed or performed during the hospital encounter of 01/19/14 (from the past 72 hour(s))  Glucose, capillary     Status: Abnormal   Collection Time: 01/23/14 11:52 AM   Result Value Ref Range   Glucose-Capillary 139 (H) 70 - 99 mg/dL  Glucose, capillary     Status: Abnormal   Collection Time: 01/23/14  4:15 PM  Result Value Ref Range   Glucose-Capillary 144 (H) 70 - 99 mg/dL  Glucose, capillary     Status: Abnormal   Collection Time: 01/23/14  8:57 PM  Result Value Ref Range   Glucose-Capillary 145 (H) 70 - 99 mg/dL  Glucose, capillary     Status: Abnormal   Collection Time: 01/24/14  6:36 AM  Result Value Ref Range   Glucose-Capillary 123 (H) 70 - 99 mg/dL  Glucose, capillary     Status: Abnormal   Collection Time: 01/24/14 11:28 AM  Result Value Ref Range   Glucose-Capillary 159 (H) 70 - 99 mg/dL  Glucose, capillary     Status: Abnormal   Collection Time: 01/24/14  4:55 PM  Result Value Ref Range   Glucose-Capillary 161 (H) 70 - 99 mg/dL  Glucose, capillary     Status: Abnormal   Collection Time: 01/24/14 10:21 PM  Result Value Ref Range   Glucose-Capillary 142 (H) 70 - 99 mg/dL  Glucose, capillary     Status: Abnormal   Collection Time: 01/25/14  7:11 AM  Result Value Ref Range   Glucose-Capillary 130 (H) 70 - 99 mg/dL  Glucose, capillary     Status: Abnormal   Collection Time: 01/25/14 11:35 AM  Result Value Ref Range   Glucose-Capillary 142 (H) 70 - 99 mg/dL  Glucose, capillary     Status: Abnormal   Collection Time: 01/25/14  4:51 PM  Result Value Ref Range   Glucose-Capillary 124 (H) 70 - 99 mg/dL  Comment 1 Notify RN   Glucose, capillary     Status: Abnormal   Collection Time: 01/25/14  9:13 PM  Result Value Ref Range   Glucose-Capillary 150 (H) 70 - 99 mg/dL  Glucose, capillary     Status: Abnormal   Collection Time: 01/26/14  6:51 AM  Result Value Ref Range   Glucose-Capillary 112 (H) 70 - 99 mg/dL      Nursing note and vitals reviewed.  Constitutional: She is oriented to person, place, and time. She appears well-developed and well-nourished.  HENT:  Head: Normocephalic and atraumatic.  Eyes: Conjunctivae are  normal. Pupils are equal, round, and reactive to light.  Neck: Normal range of motion.  Cardiovascular: Normal rate and regular rhythm.  No murmur heard.  Respiratory: Effort normal and breath sounds normal. No respiratory distress. She has no wheezes. She exhibits no tenderness (sternal area with radiation markings.).  GI: Soft. Bowel sounds are normal. She exhibits no distension. There is no tenderness.  Musculoskeletal: She exhibits no edema and no tenderness.  Neurological: She is alert and oriented to person, place, and time.   Dense paraparesis with flexor spasms and few beats clonus on the right. Diminished sensation 1/2 below lower/mid chest wall.   Skin: Skin is warm and dry.  Intergluteal area still red but stable. Large foam dressing still on sacrum.  Psychiatric: She has a normal mood and affect. Her behavior is normal. Judgment and thought content normal.  Motor strength is 5/5 bilateral deltoids, biceps, triceps, grip  0/5 bilateral hip flexors knee extensors ankle dorsiflexor plantar flexor Tone 2/4 left greater than right LE's   Assessment/Plan: 1. Functional deficits secondary to Paraplegia which require 3+ hours per day of interdisciplinary therapy in a comprehensive inpatient rehab setting. Physiatrist is providing close team supervision and 24 hour management of active medical problems listed below. Physiatrist and rehab team continue to assess barriers to discharge/monitor patient progress toward functional and medical goals.  Discussed with patient that her family needs to be in to see what her needs are and to begin the educational process.  FIM: FIM - Bathing Bathing Steps Patient Completed: Chest, Right Arm, Left Arm, Abdomen, Right upper leg, Left upper leg Bathing: 3: Mod-Patient completes 5-7 66f 10 parts or 50-74%  FIM - Upper Body Dressing/Undressing Upper body dressing/undressing steps patient completed: Thread/unthread right sleeve of pullover  shirt/dresss, Thread/unthread left sleeve of pullover shirt/dress, Put head through opening of pull over shirt/dress Upper body dressing/undressing: 5: Set-up assist to: Obtain clothing/put away FIM - Lower Body Dressing/Undressing Lower body dressing/undressing: 1: Total-Patient completed less than 25% of tasks  FIM - Toileting Toileting: 0: Activity did not occur  FIM - Air cabin crew Transfers: 0-Activity did not occur  FIM - Control and instrumentation engineer Devices: Sliding board, Arm rests Bed/Chair Transfer: 2: Supine > Sit: Max A (lifting assist/Pt. 25-49%), 1: Two helpers, 1: Bed > Chair or W/C: Total A (helper does all/Pt. < 25%)  FIM - Locomotion: Wheelchair Distance: 65' Locomotion: Wheelchair: 2: Travels 92 - 149 ft with minimal assistance (Pt.>75%) FIM - Locomotion: Ambulation Locomotion: Ambulation: 0: Activity did not occur  Comprehension Comprehension Mode: Auditory Comprehension: 6-Follows complex conversation/direction: With extra time/assistive device  Expression Expression Mode: Verbal Expression: 6-Expresses complex ideas: With extra time/assistive device  Social Interaction Social Interaction: 6-Interacts appropriately with others with medication or extra time (anti-anxiety, antidepressant).  Problem Solving Problem Solving: 6-Solves complex problems: With extra time  Memory Memory: 6-More than reasonable amt of  time  Medical Problem List and Plan:  1. 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  2. DVT Prophylaxis/Anticoagulation: Pharmaceutical: Xarelto per Hem/Onc input.  3. Pain Management: Denies back pain. But will need medication to help manage spacticity.  4. Mood:prn ativan for anxiety,       -pt aware of situational anxiety with therapy 5. Neuropsych: This patient is capable of making decisions on her own behalf.  6. Skin/Wound Care:  Air mattress overlay for prevention of break down. Start educating patient and family on need for pressure relief measures.  7. Fluids/Electrolytes/Nutrition: encourage po intake.[name] Kitchen Routine check of lytes.  8. Neurogenic bladder: foley 9. 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 For RAI at 6-8 weeks post op.  11. HTN: Will monitor every 8 hours. No medications as this time. Monitor for orthostatic changes once OOB.  12 PCOS? With hyperglycemia: Hgb A1c-6.7. [name] Kitchen   -sugars reasonable 13. E coli UTI--keflex complete 14. Hemorrhoids: continue proctofoam cream bid in addition to good rectal hygiene to prevent irritation from stool.  15. Spasticity: baclofen trial.   LOS (Days) 7 A FACE TO FACE EVALUATION WAS PERFORMED  SWARTZ,ZACHARY T 01/26/2014, 8:08 AM

## 2014-01-26 NOTE — Progress Notes (Signed)
Social Work Patient ID: Sarah [name], female   DOB: 01/31/1966, 48 y.o.   MRN: [id number]   Met with pt this morning to review team conference.  Pt agreeable with targeted d/c date of 11/20.  Discussed concerns of team about the level of care pt expected to need at home.  She had spoken earlier with PT about assist needs of sliding board transfers/ mod assist/ possible hoyer.  She feels that her family is very committed to providing any assistance needed at home.  Discussed need to begin "hands on" with family next week. Will follow up with mother as well.  Will continue to follow for support and d/c planning needs.  Neuropsychology in to see pt after I left.  Myah Guynes, LCSW

## 2014-01-26 NOTE — Plan of Care (Signed)
Problem: SCI BOWEL ELIMINATION Goal: RH STG MANAGE BOWEL WITH ASSISTANCE STG Manage Bowel with max Assistance.  Outcome: Progressing Goal: RH STG SCI MANAGE BOWEL WITH MEDICATION WITH ASSISTANCE STG SCI Manage bowel with medication with minimal assistance.  Outcome: Progressing  Problem: SCI BLADDER ELIMINATION Goal: RH STG MANAGE BLADDER WITH ASSISTANCE STG Manage Bladder With total Assistance (foley in place)  Outcome: Progressing

## 2014-01-26 NOTE — Plan of Care (Signed)
Problem: SCI BOWEL ELIMINATION Goal: RH STG MANAGE BOWEL WITH ASSISTANCE STG Manage Bowel with max Assistance.  Outcome: Progressing Goal: RH STG SCI MANAGE BOWEL WITH MEDICATION WITH ASSISTANCE STG SCI Manage bowel with medication with minimal assistance.  Outcome: Progressing Goal: RH STG MANAGE BOWEL W/EQUIPMENT W/ASSISTANCE STG Manage Bowel With Equipment With moderate Assistance  Outcome: Progressing Goal: RH STG SCI MANAGE BOWEL PROGRAM W/ASSIST OR AS APPROPRIATE STG SCI Manage bowel program w/ max assist or as appropriate.  Outcome: Progressing  Problem: SCI BLADDER ELIMINATION Goal: RH STG MANAGE BLADDER WITH ASSISTANCE STG Manage Bladder With total Assistance (foley in place)  Outcome: Progressing Goal: RH STG SCI MANAGE BLADDER PROGRAM W/ASSISTANCE Outcome: Progressing  Problem: RH SKIN INTEGRITY Goal: RH STG SKIN FREE OF INFECTION/BREAKDOWN Skin free of infection/breakdown with moderatel assistance  Outcome: Progressing Goal: RH STG MAINTAIN SKIN INTEGRITY WITH ASSISTANCE STG Maintain Skin Integrity With moderate Assistance.  Outcome: Progressing Goal: RH STG ABLE TO PERFORM INCISION/WOUND CARE W/ASSISTANCE STG Able To Perform Incision/Wound Care With total Assistance of back incision.  Outcome: Progressing  Problem: RH SAFETY Goal: RH STG ADHERE TO SAFETY PRECAUTIONS W/ASSISTANCE/DEVICE STG Adhere to Safety Precautions With minimal Assistance/Device.  Outcome: Progressing Goal: RH STG DEMO UNDERSTANDING HOME SAFETY PRECAUTIONS Outcome: Progressing  Problem: RH PAIN MANAGEMENT Goal: RH STG PAIN MANAGED AT OR BELOW PT'S PAIN GOAL Pain less than 3  Outcome: Progressing

## 2014-01-26 NOTE — Progress Notes (Signed)
Occupational Therapy Note  Patient Details  Name: Sarah [name] MRN: [id number] Date of Birth: 08-02-1965  Today's Date: 01/26/2014  Pt missed 60 min skilled OT due to pt at radiation. RN aware and rehab schedule team notified.    [name] 01/26/2014, 2:51 PM

## 2014-01-26 NOTE — Progress Notes (Signed)
Physical Therapy Session Note  Patient Details  Name: Sarah [name] MRN: [id number] Date of Birth: 1965-11-13  Today's Date: 01/26/2014 PT Individual Time: 0907-1000 PT Individual Time Calculation (min): 53 min   Session 2 Time: 6712-4580 Time Calculation (min): 70 min   Short Term Goals: Week 1:  PT Short Term Goal 1 (Week 1): Pt will roll R and L in bed without rail req min A.  PT Short Term Goal 2 (Week 1): Pt will demonstrate supine to sit transfer with rail and 1 person assist.  PT Short Term Goal 3 (Week 1): Pt will scoot transfer bed to/from w/c with slideboard and 1 person assist PT Short Term Goal 4 (Week 1): Pt will initiate w/c propulsion.  PT Short Term Goal 5 (Week 1): Pt will tolerate dynamic sit balance req mod A.   Skilled Therapeutic Interventions/Progress Updates:    Session 1: Pt received seated in TIS w/c, agreeable to participate in therapy. Pt performed sliding board transfer to R w/c>bed w/ +2A, therapist in front providing [place] for anterior weight shift, foot placement and therapist in back providing [place] force to scoot hips to R. Seated at EOB pt completed seated balance work w/ reaching anterior and R in attempt to lengthen R trunk and shorten L trunk (likely d/t pelvic positioning). Pt able to maintain seated balance w/ 2 UE support w/ close SBA, 1 UE support for up to 5 seconds at a time w/ assist ranging from CGA-ModA. Moved sit>supine w/ +2A. Pt left supine in bed w/ all needs within reach.   Session 2: Pt received supine in bed, agreeable to participate in therapy. Pt rolled L/R w/ TotalA to place leg in position then [place] to roll in order to dress LB and place sling. Pt transferred bed<>w/c w/ TotalA w/ MaxiMove lift. Pt propelled w/c 75', then 60', then 55' to rehab gym w/ supervision in controlled environment, [address] to navigate through smaller doorways. Seated in w/c pt participated in seated balance activity w/ batting ball back and forth w/ 1# UE dowel. Pt  provided w/ passive stretching of bilateral hamstrings/calves for tone management and improved positioning. Pt propelled w/c 75' back towards room, then transported remaining distance. [employer] delivered new bed to pt's room and pt transferred back to bed w/ [facility] lift. Rolled L/R w/ MaxA to remove sling. Pt left supine in bed w/ all needs within reach.   Therapy Documentation Precautions:  Precautions Precautions: Fall, Back Precaution Comments: bil leg weakness, paraplegia Restrictions Weight Bearing Restrictions: No General:   Vital Signs: Therapy Vitals Temp: 98.3 F (36.8 C) Temp Source: Oral Pulse Rate: 97 Resp: 18 BP: (!) 119/55 mmHg Patient Position (if appropriate): Lying Oxygen Therapy SpO2: 95 % O2 Device: Not Delivered Pain: Pain Assessment Pain Score: 0-No pain Mobility:   Locomotion :    Trunk/Postural Assessment :    Balance:   Exercises:   Other Treatments:    See FIM for current functional status  Therapy/Group: Individual Therapy  Rada Hay  Rada Hay, PT, DPT 01/26/2014, 7:40 AM

## 2014-01-27 ENCOUNTER — Ambulatory Visit
Admit: 2014-01-27 | Discharge: 2014-01-27 | Disposition: A | Discharge status: Home / Self Care | Payer: Medicaid Other | Attending: Radiation Oncology | Admitting: Radiation Oncology

## 2014-01-27 ENCOUNTER — Inpatient Hospital Stay (HOSPITAL_COMMUNITY): Payer: Medicaid Other | Admitting: Physical Therapy

## 2014-01-27 ENCOUNTER — Inpatient Hospital Stay (HOSPITAL_COMMUNITY): Payer: Medicaid Other

## 2014-01-27 ENCOUNTER — Encounter (HOSPITAL_COMMUNITY): Payer: Medicaid Other | Admitting: Occupational Therapy

## 2014-01-27 DIAGNOSIS — E119 Type 2 diabetes mellitus without complications: Secondary | ICD-10-CM

## 2014-01-27 LAB — GLUCOSE, CAPILLARY
GLUCOSE-CAPILLARY: 147 mg/dL — AB (ref 70–99)
GLUCOSE-CAPILLARY: 152 mg/dL — AB (ref 70–99)
Glucose-Capillary: 142 mg/dL — ABNORMAL HIGH (ref 70–99)
Glucose-Capillary: 130 mg/dL — ABNORMAL HIGH (ref 70–99)

## 2014-01-27 MED ORDER — ENSURE COMPLETE PO LIQD
237.0000 mL | Freq: Every day | ORAL | Status: DC
  Administered 2014-01-27 – 2014-02-06 (×2): 237 mL via ORAL

## 2014-01-27 NOTE — Progress Notes (Signed)
Occupational Therapy Weekly Progress Note  Patient Details  Name: Sarah [name] MRN: [id number] Date of Birth: May 29, 1965  Beginning of progress report period: January 20, 2014 End of progress report period: January 27, 2014  Today's Date: 01/27/2014 OT Individual Time: 2458-0998 OT Individual Time Calculation (min): 60 min   Patient has met 2 of 5 short term goals.  Pt is progressing with   Patient continues to demonstrate the following deficits: Impaired static sitting balance, impaired bed mobility, impaired transfers, UE weakness, deficient compensatory skills with assisted bathing/dressing, and therefore will continue to benefit from skilled OT intervention to enhance overall performance with BADL and Reduce care partner burden.  Patient progressing toward long term goals..  Continue plan of care.  OT Short Term Goals Week 1:  OT Short Term Goal 1 (Week 1): Pt will complete upper body bathing with setup assist OT Short Term Goal 1 - Progress (Week 1): Met OT Short Term Goal 2 (Week 1): Pt will rise from supine to sitting at edge of bed with mod assist to manage LE OT Short Term Goal 2 - Progress (Week 1): Progressing toward goal OT Short Term Goal 3 (Week 1): Pt will maintain supported sitting balance at edge of bed for 5 min during assisted lower body dressing OT Short Term Goal 3 - Progress (Week 1): Progressing toward goal OT Short Term Goal 4 (Week 1): Pt will complete lateral leans to pull up pants with mod assist OT Short Term Goal 4 - Progress (Week 1): Not progressing OT Short Term Goal 5 (Week 1): Pt will complete grooming at w/c level with setup assist OT Short Term Goal 5 - Progress (Week 1): Met Week 2:  OT Short Term Goal 1 (Week 2): Pt will rise from supine to sitting at edge of bed with mod assist to manage LE OT Short Term Goal 2 (Week 2): Pt will demo ability to complete slide board transfer from bed to w/c with mod A X1  OT Short Term Goal 3 (Week 2): Pt will  demo ability to use leg lifter to manage legs with min assist during bed mobility OT Short Term Goal 4 (Week 2): Pt will demo ability to sustain supported sitting balance at raised mat while performing a functional task for 5 minutes with only supervision for safety.  Skilled Therapeutic Interventions/Progress Updates: Therapeutic activities with emphasis on bed mobility, static and dynamic sitting balance, AE use and slide board transfers.    Pt received supine in bed, finishing a sandwich and reporting dissatisfaction with breakfast meals provided.    Pt re-educated on goals to address bed mobility, sitting balance, and transfers to w/c.   Pt was also instructed on her need to develop skills in role directing care and she accepted role this session to direct bed mobility and transfer to w/c.   With total assist to complete lower body dressing, pt performed log roll to her right using leg lifter to assist with mod assist to complete roll.   Pt was unable to determine need for [facility] elevation and attempted to rise from side lying to sitting with HOB minimally raised but she lacked UE strength to push upper body, using just bed rail.   Pt rose to sit at edge of bed with max assist and facilitation with right hip lateral flexion d/t discomfort at upper trunk.   Pt required max assist for slide board transfer with mod assist to perform lateral lean to insert board and mod assist  to perform sequential anterior leans/weight-shifting during lateral slide board transfer to tilt-in-space w/c.    Pt left in w/c at end of session with RMS supervisor attending to inspection of w/c components d/t missing left arm rest.     Therapy Documentation Precautions:  Precautions Precautions: Fall, Back Precaution Comments: bil leg weakness, paraplegia Restrictions Weight Bearing Restrictions: No  Pain: Pain Assessment Pain Assessment: 0-10 Pain Score: 2  Pain Type: Surgical pain Pain Location: Back Pain Orientation:  Mid;Upper;Medial Pain Descriptors / Indicators: Aching Pain Onset: On-going Pain Intervention(s): Repositioned;Rest Multiple Pain Sites: Yes 2nd Pain Site Pain Score: 2 Pain Type: Acute pain Pain Location: Scapula Pain Orientation: Right;Left;Posterior Pain Descriptors / Indicators: Aching Pain Onset: Gradual Pain Intervention(s): Rest  ADL: ADL ADL Comments: see FIM  See FIM for current functional status  Therapy/Group: Individual Therapy  Larin Depaoli 01/27/2014, 10:54 AM

## 2014-01-27 NOTE — Progress Notes (Signed)
Occupational Therapy Note  Patient Details  Name: MARILIN KOFMAN MRN: [id number] Date of Birth: February 01, 1966   Pt missed 60 min skilled OT due to pt leaving for radiation late. RN aware and scheduling team notified.   Stevey Stapleton Raynell 01/27/2014, 2:15 PM

## 2014-01-27 NOTE — Progress Notes (Signed)
Physical Therapy Note  Patient Details  Name: Sarah [name] MRN: [id number] Date of Birth: 1965-10-23 Today's Date: 01/27/2014    Plan of care change for Monday, November 9th and Tuesday, November 10th to limit therapy time at 3 hours due to radiation therapy schedule. Plan of Care to resume back to 4 hours of therapy daily on Wednesday, November 11th, as radiation therapy will be complete.   [facility] M 01/27/2014, 5:02 PM

## 2014-01-27 NOTE — Plan of Care (Signed)
Problem: SCI BOWEL ELIMINATION Goal: RH STG SCI MANAGE BOWEL PROGRAM W/ASSIST OR AS APPROPRIATE STG SCI Manage bowel program w/ max assist or as appropriate.  Outcome: Progressing

## 2014-01-27 NOTE — Progress Notes (Signed)
[facility] Therapy Treatment Record Phone [phone]   Radiation Therapy was administered to Sarah [name] on: 01/27/2014  2:09 PM and was treatment # 8 out of a planned course of 10 treatments.

## 2014-01-27 NOTE — Plan of Care (Signed)
Problem: SCI BOWEL ELIMINATION Goal: RH STG MANAGE BOWEL WITH ASSISTANCE STG Manage Bowel with max Assistance.  Outcome: Progressing

## 2014-01-27 NOTE — Progress Notes (Signed)
48 y.o. female with history of HTN, chronic back pain, morbid obesity who was admitted on 12/10/13 with BLE weakness, inability to walk as well as problems with urinary retention and constipation. Work up revealed a lesion at the T3 level of the upper thoracic spine with pathologic compression fracture and epidural tumor resulting in moderate cord compression with evidence of mild spinal cord edema, as well as right suprahilar mass with pulmonary nodules with mediastinal lymph nodes c/w metastatic disease, RUL & RLL PE, tubular cystic masses bilateral adnexal regions and a massive thyroid goiter causing tracheal deviation to the right with narrowing of trachea. She was started on steriods and foley placed for urinary retention. She underwent thoracic laminectomy with excision of tumor on 09/23 by Dr. Christella Noa and on bedrest pending further surgery. Pathology favoring metastatic adenocarcinoma of lung but metastatic thyroid cancer in differential . 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 Buddy Duty consulted for input. 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 Patient underwent T1-T5 posterolateral arthrodesis with allograft on 10/09 with plans XRT to be initiated 2 weeks post op. Dr. Constance Holster consulted for input and patient underwent total thyroidectomy on 10/19 and extubated without difficulty on 10/20.  Subjective/Complaints: Slept well. Denies new pain. Having intermittent spasms but able to deal with them.   Vital Signs: Blood pressure 127/75, pulse 98, temperature 98.5 F (36.9 C), temperature source Oral, resp. rate 18, height 5\' 11"  (1.803 m), weight 128 kg (282 lb 3 oz), last menstrual period 12/06/2013, SpO2 96 %. No results found. Results for orders placed or performed during the hospital encounter of 01/19/14 (from the past 72 hour(s))  Glucose, capillary     Status: Abnormal   Collection Time:  01/24/14 11:28 AM  Result Value Ref Range   Glucose-Capillary 159 (H) 70 - 99 mg/dL  Glucose, capillary     Status: Abnormal   Collection Time: 01/24/14  4:55 PM  Result Value Ref Range   Glucose-Capillary 161 (H) 70 - 99 mg/dL  Glucose, capillary     Status: Abnormal   Collection Time: 01/24/14 10:21 PM  Result Value Ref Range   Glucose-Capillary 142 (H) 70 - 99 mg/dL  Glucose, capillary     Status: Abnormal   Collection Time: 01/25/14  7:11 AM  Result Value Ref Range   Glucose-Capillary 130 (H) 70 - 99 mg/dL  Glucose, capillary     Status: Abnormal   Collection Time: 01/25/14 11:35 AM  Result Value Ref Range   Glucose-Capillary 142 (H) 70 - 99 mg/dL  Glucose, capillary     Status: Abnormal   Collection Time: 01/25/14  4:51 PM  Result Value Ref Range   Glucose-Capillary 124 (H) 70 - 99 mg/dL   Comment 1 Notify RN   Glucose, capillary     Status: Abnormal   Collection Time: 01/25/14  9:13 PM  Result Value Ref Range   Glucose-Capillary 150 (H) 70 - 99 mg/dL  Glucose, capillary     Status: Abnormal   Collection Time: 01/26/14  6:51 AM  Result Value Ref Range   Glucose-Capillary 112 (H) 70 - 99 mg/dL  Glucose, capillary     Status: Abnormal   Collection Time: 01/26/14 11:38 AM  Result Value Ref Range   Glucose-Capillary 129 (H) 70 - 99 mg/dL   Comment 1 Notify RN   Glucose, capillary     Status: Abnormal   Collection Time: 01/26/14  4:54  PM  Result Value Ref Range   Glucose-Capillary 151 (H) 70 - 99 mg/dL   Comment 1 Notify RN   Glucose, capillary     Status: Abnormal   Collection Time: 01/26/14  8:41 PM  Result Value Ref Range   Glucose-Capillary 177 (H) 70 - 99 mg/dL  Glucose, capillary     Status: Abnormal   Collection Time: 01/27/14  6:30 AM  Result Value Ref Range   Glucose-Capillary 142 (H) 70 - 99 mg/dL      Nursing note and vitals reviewed.  Constitutional: She is oriented to person, place, and time. She appears well-developed and well-nourished.  HENT:   Head: Normocephalic and atraumatic.  Eyes: Conjunctivae are normal. Pupils are equal, round, and reactive to light.  Neck: Normal range of motion.  Cardiovascular: Normal rate and regular rhythm.  No murmur heard.  Respiratory: Effort normal and breath sounds normal. No respiratory distress. She has no wheezes. She exhibits no tenderness (sternal area with radiation markings.).  GI: Soft. Bowel sounds are normal. She exhibits no distension. There is no tenderness.  Musculoskeletal: She exhibits no edema and no tenderness.  Neurological: She is alert and oriented to person, place, and time.   Dense paraparesis with flexor spasms and few beats clonus on the right. Diminished sensation 1/2 below lower/mid chest wall.   Skin: Skin is warm and dry.  Intergluteal and sacrum still a little irritated Psychiatric: She has a normal mood and affect. Her behavior is normal. Judgment and thought content normal.  Motor strength is 5/5 bilateral deltoids, biceps, triceps, grip  0/5 bilateral hip flexors knee extensors ankle dorsiflexor plantar flexor Tone 2/4 left greater than right LE's and fluctuating   Assessment/Plan: 1. Functional deficits secondary to Paraplegia which require 3+ hours per day of interdisciplinary therapy in a comprehensive inpatient rehab setting. Physiatrist is providing close team supervision and 24 hour management of active medical problems listed below. Physiatrist and rehab team continue to assess barriers to discharge/monitor patient progress toward functional and medical goals.  Pt is aware that family needs to be in for observation and ed.  FIM: FIM - Bathing Bathing Steps Patient Completed: Chest, Right Arm, Left Arm, Abdomen, Right upper leg, Left upper leg Bathing: 3: Mod-Patient completes 5-7 66f 10 parts or 50-74%  FIM - Upper Body Dressing/Undressing Upper body dressing/undressing steps patient completed: Thread/unthread right sleeve of pullover shirt/dresss,  Thread/unthread left sleeve of pullover shirt/dress, Put head through opening of pull over shirt/dress Upper body dressing/undressing: 5: Set-up assist to: Obtain clothing/put away FIM - Lower Body Dressing/Undressing Lower body dressing/undressing: 1: Total-Patient completed less than 25% of tasks  FIM - Toileting Toileting: 1: Total-Patient completed zero steps, helper did all 3  FIM - Air cabin crew Transfers: 0-Activity did not occur  FIM - Control and instrumentation engineer Devices: Sliding board, Arm rests Bed/Chair Transfer: 1: Two helpers  FIM - Locomotion: Wheelchair Distance: 75 Locomotion: Wheelchair: 2: Travels 50 - 149 ft with supervision, cueing or coaxing FIM - Locomotion: Ambulation Locomotion: Ambulation: 0: Activity did not occur  Comprehension Comprehension Mode: Auditory Comprehension: 6-Follows complex conversation/direction: With extra time/assistive device  Expression Expression Mode: Verbal Expression: 6-Expresses complex ideas: With extra time/assistive device  Social Interaction Social Interaction: 6-Interacts appropriately with others with medication or extra time (anti-anxiety, antidepressant).  Problem Solving Problem Solving: 6-Solves complex problems: With extra time  Memory Memory: 7-Complete Independence: No helper  Medical Problem List and Plan:  1. 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  2. DVT Prophylaxis/Anticoagulation: Pharmaceutical: Xarelto per Hem/Onc input.  3. Pain Management: Denies back pain. But will need medication to help manage spacticity.  4. Mood:prn ativan for anxiety,       -pt aware of situational anxiety with therapy--some improvement? 5. Neuropsych: This patient is capable of making decisions on her own behalf.  6. Skin/Wound Care: Air mattress overlay for prevention of break down. Start educating patient  and family on need for pressure relief measures.  7. Fluids/Electrolytes/Nutrition: encourage po intake.[name] Kitchen Routine check of lytes.  8. Neurogenic bladder: foley 9. 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 For RAI at 6-8 weeks post op.  11. HTN: Will monitor every 8 hours. No medications as this time. Monitor for orthostatic changes once OOB.  12 PCOS? With hyperglycemia: Hgb A1c-6.7. [name] Kitchen   -sugars reasonable 13. E coli UTI--keflex complete 14. Hemorrhoids: continue proctofoam cream bid in addition to good rectal hygiene to prevent irritation from stool.  15. Spasticity: baclofen trial.   LOS (Days) 8 A FACE TO FACE EVALUATION WAS PERFORMED  SWARTZ,ZACHARY T 01/27/2014, 7:55 AM

## 2014-01-27 NOTE — Plan of Care (Signed)
Problem: RH PAIN MANAGEMENT Goal: RH STG PAIN MANAGED AT OR BELOW PT'S PAIN GOAL Pain less than 3  Outcome: Not Progressing Rates pain as 3-4

## 2014-01-27 NOTE — Progress Notes (Signed)
NUTRITION FOLLOW UP  DOCUMENTATION CODES Per approved criteria  -Morbid Obesity   INTERVENTION: Discontinue Resource Breeze po TID, each supplement provides 250 kcal and 9 grams of protein.  Provide Ensure Complete po once daily, each supplement provides 350 kcal and 13 grams of protein.  NUTRITION DIAGNOSIS: Inadequate oral intake related to decreased appetite as evidenced by meal completion of 50-85%; improving  Goal: Pt to meet >/= 90% of their estimated nutrition needs; unmet  Monitor:  PO intake, weight trends, labs, I/O's  48 y.o. 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 Work up revealed a lesion at the T3 level of the upper thoracic spine with pathologic compression fracture and epidural tumor resulting in moderate cord compression with evidence of mild spinal cord edema, as well as right suprahilar mass with pulmonary nodules with mediastinal lymph nodes c/w metastatic disease.  Meal completion is 25-50%. Pt reports her appetite is improving. Pt reports she would like to have Ensure ordered instead of [employer]. Will modify orders. Pt was encouraged to eat her food at meals.  Height: Ht Readings from Last 1 Encounters:  01/19/14 5\' 11"  (1.803 m)    Weight: Wt Readings from Last 1 Encounters:  01/25/14 282 lb 3 oz (128 kg)    BMI:  Body mass index is 39.37 kg/(m^2). Morbid obesity  Re-Estimated Nutritional Needs: Kcal: 2100-2400 Protein: 135-150 grams Fluid: 2.1 - 2.4 L/day  Skin: incision on back and neck, non-pitting LE edema  Diet Order: Diet Carb Modified    Intake/Output Summary (Last 24 hours) at 01/27/14 1412 Last data filed at 01/27/14 0800  Gross per 24 hour  Intake    360 ml  Output   1101 ml  Net   -741 ml     Last BM: 11/6  Labs:  No results for input(s): NA, K, CL, CO2, BUN, CREATININE, CALCIUM, MG, PHOS, GLUCOSE in the last 168 hours.  CBG (last 3)   Recent Labs  01/26/14 2041 01/27/14 0630 01/27/14 1210  GLUCAP 177* 142* 130*    Scheduled Meds: . baclofen  10 mg Oral BID  . feeding supplement (RESOURCE BREEZE)  1 Container Oral Q1500  . Glycerin (Adult)  1 suppository Rectal QHS  . hydrocortisone-pramoxine  1 applicator Rectal BID  . insulin aspart  0-15 Units Subcutaneous TID WC  . insulin aspart  0-5 Units Subcutaneous QHS  . levothyroxine  250 mcg Oral QAC breakfast  . potassium chloride  20 mEq Oral Daily  . Rivaroxaban  15 mg Oral BID WC  . [START ON 02/10/2014] rivaroxaban  20 mg Oral Daily  . senna-docusate  2 tablet Oral BID    Continuous Infusions:   Past Medical History  Diagnosis Date  . Hypertension   . DDD (degenerative disc disease), cervical   . DJD (degenerative joint disease)   . Obesity   . Chronic back pain   . 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 History of rectal fissure   . Rotator cuff tendonitis right    Past Surgical History  Procedure Laterality Date  . Tonsillectomy    . Laminectomy N/A 12/14/2013    Procedure: THORACIC LAMINECTOMY FOR TUMOR THORACIC THREE;  Surgeon: Ashok Pall, MD;  Location: [facility];  Service: Neurosurgery;  Laterality: N/A;  THORACIC LAMINECTOMY FOR TUMOR THORACIC THREE  . Posterior lumbar fusion 4 level N/A 12/30/2013    Procedure: Thoracic one-Thoracic five posterior thoracic fusion with pedicle screws;  Surgeon: Ashok Pall, MD;  Location: [facility];  Service: Neurosurgery;  Laterality: N/A;  Thoracic one-Thoracic five posterior thoracic fusion with pedicle screws  . Thyroidectomy N/A 01/09/2014    Procedure: TOTAL THYROIDECTOMY;  Surgeon: Izora Gala, MD;  Location: [facility];  Service: ENT;  Laterality: N/A;    Kallie Locks, MS, RD, LDN Pager # [phone] After  hours/ weekend pager # [phone]

## 2014-01-27 NOTE — Progress Notes (Addendum)
Physical Therapy Weekly Progress Note  Patient Details  Name: Sarah [name] MRN: [id number] Date of Birth: Mar 14, 1966  Beginning of progress report period: January 19, 2014 End of progress report period: January 27, 2014  Today's Date: 01/27/2014 PT Individual Time: 1000-1100 Treatment Session 2:1510-1615 PT Individual Time Calculation (min): 60 min  Treatment Session 2: 65 min  Patient has met 4 of 5 short term goals.    Patient continues to demonstrate the following deficits: difficulty with transfers, inability to walk, low activity tolerance, paraplegia, impaired sitting balance, poor standing tolerance, difficulty with bed mobility and therefore will continue to benefit from skilled PT intervention to enhance overall performance with activity tolerance, balance, postural control, ability to compensate for deficits and coordination.  Patient progressing toward long term goals..  Continue plan of care.  PT Short Term Goals Week 1:  PT Short Term Goal 1 (Week 1): Pt will roll R and L in bed without rail req min A.  PT Short Term Goal 1 - Progress (Week 1): Updated due to goal met PT Short Term Goal 2 (Week 1): Pt will demonstrate supine to sit transfer with rail and 1 person assist.  PT Short Term Goal 2 - Progress (Week 1): Updated due to goal met PT Short Term Goal 3 (Week 1): Pt will scoot transfer bed to/from w/c with slideboard and 1 person assist PT Short Term Goal 3 - Progress (Week 1): Progressing toward goal PT Short Term Goal 4 (Week 1): Pt will initiate w/c propulsion.  PT Short Term Goal 4 - Progress (Week 1): Updated due to goal met PT Short Term Goal 5 (Week 1): Pt will tolerate dynamic sit balance req mod A.  PT Short Term Goal 5 - Progress (Week 1): Updated due to goal met Week 2:  PT Short Term Goal 1 (Week 2): Pt will scoot transfer bed to/from w/c with slideboard and 1 person assist PT Short Term Goal 2 (Week 2): Pt will propel w/c x 100' req SBA.  PT Short Term  Goal 3 (Week 2): Pt will demonstrate ability to verbally instruct caregiver in dependent mechanical lift transfer.  PT Short Term Goal 4 (Week 2): Pt will tolerate standing frame x 1 minute.  PT Short Term Goal 5 (Week 2): Pt will demonstrate dynamic sit balance req SBA consistently.   Skilled Therapeutic Interventions/Progress Updates:    Therapeutic Activity: PT places air mattress in autofirm for increased stability, crosses pt's leg at the ankles and instructs pt in rolling R and L in bed with rail req min A for pre-setting leg placement and CGA at the hips during the roll.  PT instructs pt in supine to sit to L of bed req mod A for B LEs via side lie and with L bedrail. Pt req 2 reps to achieve sitting edge of bed due to dizziness on first attempt sitting up (knee high TED hose in place).  PT instructs pt in sit to sidelie transfer with L bedrail req mod A for B LEs.  PT instructs pt in rolling R and L in bed req min A from crossing foot at the ankles to doff pants. Pt demonstrates ability to doff shirt with only min A in supine.   Therapeutic Exercise: PT instructs pt in P/AAROM strengthening and ROM exercises while sitting edge of bed with wooden box under feet for assist in balance support and pt holds L bedrail entire time: LAQ with tapping on quad (minimal movement with tapping noted  bilaterally), knee flexion ROM after PT straightens leg (muscle activation clearly felt in this position), ankle DF (possible trace movement noted on R side only), ankle PF (possible trance movement noted on R side only), hip flexion (no volitional movement noted on either side), hip extension (muscle activation clearly felt in this position), hip adduction in sit (no volitional movement noted on either side), hip abduction (muscle activation slightly felt in this position bilaterally).  In supine, PT instructs pt in heel slides with pt pushing on descent x 5 reps B LEs.  PT stretches pt's hamstrings and calves x  1 minute each LE.   Treatment Session 2: W/C Management: PT and Cindee Salt and Mark from [employer] complete a custom power w/c evaluation.   PT recommends: 1. A HD power w/c: due to pt's B UE weakness, low activity tolerance, and difficulty self propelling a manual w/c on level surface and pt's body weight at 300 pounds.  2. A contoured positioning back: to provide pt with appropriate positioning stability due to pt's level of T3 spinal cord injury.  3. A power tilt function: for appropriate pressure relieving in order to maintain skin integrity on the bottom and sacral area due to pt inability to complete a chair push up for appropriate pressure relief and to give pt independence in using gravity to scoot bottom back in w/c for safe positioning.  4. Flip back armrests: in order to allow for appropriate room for pt to slideboard transfer in/out of chair.  5. R hand side control box and joystick: due to pt being right handed.  6. Head support for appropriate head positioning when sitting up in w/c due to impaired postural stability from spinal cord injury at level T3, as well as safe head support when pt is in 50 degree tilted position for bottom pressure relieving.  7. Lap belt for safe positioning when pt is up in w/c.  8. Midwheel drive: for pt being able to negotiate power chair in home in tight spaces.   Pt trialed 22" wide power w/c and demonstrated ability to use the tilt function with SBA. Pt demonstrated ability to use joystick to drive power w/c x 25' in hallway req min A for a turn.   Therapeutic Activity: PT instructs pt in rolling R and L in bed x 4 reps req min A for PT to cross pt's leg at the ankles and bedrail to place gown behind back and place sling.  PT instructs pt in sit to/from supine transfer with L bedrail req mod A for B LEs (and min A at trunk during supine to sit transfer), while [employer] vendor measure's pt body for custom power w/c.  PT completes dependent transfer from bed to  power w/c with Maxi Move lift, explaining to pt what she is doing the entire time in order to prepare pt to direct this transfer in the future.    Pt's TIS w/c armrest was not present, and so PT did not transfer pt into w/c during AM session. Pt is working very hard to try to get activation in B LEs. Pt tolerated sitting edge of bed for ~25 minutes during LE exercises. Custom power w/c eval initiated during PM session and pt obtained a loaner power w/c from w/c vendors Cindee Salt and Counce from [employer]. Another w/c assessment to be complete in a week to answer any questions about negotiating this w/c. Continue per PT POC.   Therapy Documentation Precautions:  Precautions Precautions: Fall, Back Precaution Comments: bil  leg weakness, paraplegia Restrictions Weight Bearing Restrictions: No Pain: Pain Assessment Pain Assessment: 0-10 Pain Score: 2  Pain Type: Surgical pain Pain Location: Back Pain Orientation: Mid;Upper;Medial Pain Descriptors / Indicators: Aching Pain Onset: On-going Pain Intervention(s): Repositioned;Rest Multiple Pain Sites: Yes 2nd Pain Site Pain Score: 2 Pain Type: Acute pain Pain Location: Scapula Pain Orientation: Right;Left;Posterior Pain Descriptors / Indicators: Aching Pain Onset: Gradual Pain Intervention(s): Rest  Treatment Session 2: Pt c/o 4/10 pain in mid back on surgical site - PT utilizes rest to decrease pt's pain.     See FIM for current functional status  Therapy/Group: Individual Therapy  Dhrithi Riche M 01/27/2014, 10:12 AM

## 2014-01-28 ENCOUNTER — Inpatient Hospital Stay (HOSPITAL_COMMUNITY): Payer: Medicaid Other | Admitting: Physical Therapy

## 2014-01-28 DIAGNOSIS — K641 Second degree hemorrhoids: Secondary | ICD-10-CM

## 2014-01-28 LAB — GLUCOSE, CAPILLARY
GLUCOSE-CAPILLARY: 140 mg/dL — AB (ref 70–99)
Glucose-Capillary: 136 mg/dL — ABNORMAL HIGH (ref 70–99)
Glucose-Capillary: 146 mg/dL — ABNORMAL HIGH (ref 70–99)
Glucose-Capillary: 113 mg/dL — ABNORMAL HIGH (ref 70–99)

## 2014-01-28 MED ORDER — HYDROCORTISONE 2.5 % RE CREA
TOPICAL_CREAM | Freq: Two times a day (BID) | RECTAL | Status: AC
  Administered 2014-01-28: 1 via RECTAL
  Administered 2014-01-29 – 2014-02-06 (×10): via RECTAL
  Filled 2014-01-28 (×2): qty 28.35

## 2014-01-28 NOTE — Progress Notes (Signed)
Chaplain initiated follow up with pt. Chaplain provided Bible and devotional per pt request. Pt is hopeful for recovery and would like to walk again. She is aware of the difficulty of this task but is hopeful with hard work. Pt discussed possible discharge options with chaplain. Chaplain will continue to follow.   01/28/14 1300  Clinical Encounter Type  Visited With Patient  Visit Type Follow-up;Spiritual support  Spiritual Encounters  Spiritual Needs Sacred text;Literature  Stress Factors  Patient Stress Factors Major life changes  Sarah [name], Barbette Hair, Chaplain 01/28/2014 1:20 PM

## 2014-01-28 NOTE — Progress Notes (Signed)
Physical Therapy Session Note  Patient Details  Name: Sarah [name] MRN: [id number] Date of Birth: 12-21-1965  Today's Date: 01/28/2014 PT Individual Time: 1135-1205 Treatment Session 2: 1430-1505 PT Individual Time Calculation (min): 30 min  Treatment Session 2: 35 min  Short Term Goals: Week 2:  PT Short Term Goal 1 (Week 2): Pt will scoot transfer bed to/from w/c with slideboard and 1 person assist PT Short Term Goal 2 (Week 2): Pt will propel w/c x 100' req SBA.  PT Short Term Goal 3 (Week 2): Pt will demonstrate ability to verbally instruct caregiver in dependent mechanical lift transfer.  PT Short Term Goal 4 (Week 2): Pt will tolerate standing frame x 1 minute.  PT Short Term Goal 5 (Week 2): Pt will demonstrate dynamic sit balance req SBA consistently.   Skilled Therapeutic Interventions/Progress Updates:    Therapeutic Activity: PT instructs pt in paying attention to mechanical lift transfer with Maxi Move lift. PT and PT Tech complete 2 person dependent lift transfer from bed to power w/c. PT instructs pt in rolling R and L in bed req min A to cross feet with bedrail, so that PT can place sling under pt. Pt refused slideboard transfer today, citing her hemorrhoids were really painful.   W/C Management: PT instructs pt in turning w/c on/off, maneuvering forward and with turns using joystick at slowest indoor speed, tilting and untilting w/c, and backing w/c up 5 feet in room, using floor tiles as guidance and PT cues to stop when close to back wall.   Treatment Session 2: Therapeutic Activity: PT instructs pt in rolling R and L in bed with bedrail req min A to place 2nd gown around pt's back and bottom to assist with sliding on slideboard.  PT instructs pt in L side lie to sit transfer with bedrail req mod A x 2. PT instructs pt in scoot transfer from bed to power w/c with SB req max A x 2 and verbal cues for pt to do head-hips relationship.  Once up in power w/c - PT instructs pt  to tilt w/c maximally and then PT and tech assist pt in scooting hips all the way back, while pt pushes with arms on armrests. PT and Tech also place maxi lift sling behind and under pt so that nursing can use lift to transfer pt back to bed. Pt comfortable in w/c after session and all needs in reach.   Pt req encouragement to participate in slideboard transfer during PT session in PM, today, but eventually agreed to do so. Pt continues to be fearful of falling, but is verbalizing more direction with her caregiving. Continue per PT POC.   Therapy Documentation Precautions:  Precautions Precautions: Fall, Back Precaution Comments: bil leg weakness, paraplegia Restrictions Weight Bearing Restrictions: No Pain: Pain Assessment Pain Assessment: 0-10 Pain Score: 3  Pain Type: Surgical pain Pain Location: Back Pain Orientation: Mid Pain Descriptors / Indicators: Aching Pain Onset: On-going Pain Intervention(s): Repositioned Multiple Pain Sites: Yes 2nd Pain Site Pain Score: 3 Pain Type: Acute pain Pain Location: Leg Pain Orientation: Right Pain Descriptors / Indicators: Aching Pain Onset: Gradual Pain Intervention(s): Distraction;Rest Treatment Session 2: Pt c/o 3/10 pain in mid-back from surgery and bottom from hemorrhoids; PT uses repositioning to decrease pain.   See FIM for current functional status  Therapy/Group: Individual Therapy with Allyson PT Tech  [facility] M 01/28/2014, 12:43 PM

## 2014-01-28 NOTE — Progress Notes (Signed)
Removed  Sutures from back. Per md order patient tolerated well ,after refusing x2.[name] Kitchen

## 2014-01-28 NOTE — Plan of Care (Signed)
Problem: SCI BOWEL ELIMINATION Goal: RH STG MANAGE BOWEL WITH ASSISTANCE STG Manage Bowel with max Assistance.  Outcome: Progressing Goal: RH STG SCI MANAGE BOWEL WITH MEDICATION WITH ASSISTANCE STG SCI Manage bowel with medication with minimal assistance.  Outcome: Progressing Goal: RH STG MANAGE BOWEL W/EQUIPMENT W/ASSISTANCE STG Manage Bowel With Equipment With moderate Assistance  Outcome: Progressing Goal: RH STG SCI MANAGE BOWEL PROGRAM W/ASSIST OR AS APPROPRIATE STG SCI Manage bowel program w/ max assist or as appropriate.  Outcome: Progressing  Problem: SCI BLADDER ELIMINATION Goal: RH STG MANAGE BLADDER WITH ASSISTANCE STG Manage Bladder With total Assistance (foley in place)  Outcome: Progressing  Problem: RH SKIN INTEGRITY Goal: RH STG SKIN FREE OF INFECTION/BREAKDOWN Skin free of infection/breakdown with moderatel assistance  Outcome: Progressing  Problem: RH SAFETY Goal: RH STG ADHERE TO SAFETY PRECAUTIONS W/ASSISTANCE/DEVICE STG Adhere to Safety Precautions With minimal Assistance/Device.  Outcome: Progressing  Problem: RH PAIN MANAGEMENT Goal: RH STG PAIN MANAGED AT OR BELOW PT'S PAIN GOAL Pain less than 3  Outcome: Progressing

## 2014-01-28 NOTE — Progress Notes (Addendum)
Sarah [name] is a 48 y.o. female 1966/02/07 [id number]  Subjective:  Slept well. C/o hemorrhoids. No other complants  Objective: Vital signs in last 24 hours: Temp:  [98.2 F (36.8 C)-98.4 F (36.9 C)] 98.2 F (36.8 C) (11/07 0600) Pulse Rate:  [95-100] 100 (11/07 0600) Resp:  [18] 18 (11/07 0600) BP: (128-129)/(71-76) 128/71 mmHg (11/07 0600) SpO2:  [97 %-100 %] 97 % (11/07 0600) Weight change:  Last BM Date: 01/28/14 (soft loose incont)  Intake/Output from previous day: 11/06 0701 - 11/07 0700 In: 840 [P.O.:840] Out: 551 [Urine:550; Stool:1] Last cbgs: CBG (last 3)   Recent Labs  01/27/14 1656 01/27/14 2111 01/28/14 0727  GLUCAP 147* 152* 140*     Physical Exam General: No apparent distress  Obese HEENT: not dry Lungs: Normal effort. Lungs clear to auscultation, no crackles or wheezes. Cardiovascular: Regular rate and rhythm, no edema Abdomen: S/NT/ND; BS(+) Musculoskeletal:  unchanged Neurological: No new neurological deficits Wounds: N/A    Skin: clear   Mental state: Alert, oriented, cooperative    Lab Results: BMET    Component Value Date/Time   NA 138 01/20/2014 0340   K 3.4* 01/20/2014 0340   CL 97 01/20/2014 0340   CO2 27 01/20/2014 0340   GLUCOSE 99 01/20/2014 0340   BUN 9 01/20/2014 0340   CREATININE 0.69 01/20/2014 0340   CALCIUM 8.4 01/20/2014 0340   GFRNONAA >90 01/20/2014 0340   GFRAA >90 01/20/2014 0340   CBC    Component Value Date/Time   WBC 4.9 01/20/2014 0340   RBC 3.31* 01/20/2014 0340   RBC 2.92* 01/05/2014 1305   HGB 9.2* 01/20/2014 0340   HCT 28.4* 01/20/2014 0340   PLT 343 01/20/2014 0340   MCV 85.8 01/20/2014 0340   MCH 27.8 01/20/2014 0340   MCHC 32.4 01/20/2014 0340   RDW 15.2 01/20/2014 0340   LYMPHSABS 1.9 01/20/2014 0340   MONOABS 0.5 01/20/2014 0340   EOSABS 0.1 01/20/2014 0340   BASOSABS 0.0 01/20/2014 0340    Studies/Results: No results found.  Medications: I have reviewed the patient's current  medications.  Assessment/Plan:  1. 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  2. DVT Prophylaxis/Anticoagulation: Pharmaceutical: Lovenox-will change to Xarelto per Hem/Onc input.  3. Pain Management: Denies back pain. But will need medication to help manage spacticity.  4. Mood: Reports problems sleeping due to current situation and prn ativan ineffective. She is willing to start low dose antidepressant. Will have chaplin follow for spiritual support as well as LCSW to follow for evaluation and support.  5. Neuropsych: This patient is capable of making decisions on her own behalf.  6. Skin/Wound Care: Air mattress overlay for prevention of break down. Start educating patient and family on need for pressure relief measures.  7. Fluids/Electrolytes/Nutrition: Monitor I/O. Offer supplements if intake poor. Routine check of lytes.  8. Neurogenic bladder: Will change out foley today--continue for now.  9. Neurogenic bowel: Will check KUB for baseline and start on bowel program.  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 For RAI at 6-8 weeks post op.  11. HTN: Will monitor every 8 hours. No medications as this time. Monitor for orthostatic changes once OOB.  12 PCOS? With hyperglycemia: Hgb A1c-6.7. Will modify diet to CM and have RD educate patient on restrictions.  13. E coli UTI--recurrent: continue Keflex D # 7/7.  14. Hemorrhoids: Anusol HC  Cont Rx     Length of stay,  days: 9  Walker Kehr , MD 01/28/2014, 9:06 AM

## 2014-01-29 ENCOUNTER — Inpatient Hospital Stay (HOSPITAL_COMMUNITY): Payer: Medicaid Other

## 2014-01-29 DIAGNOSIS — E1121 Type 2 diabetes mellitus with diabetic nephropathy: Secondary | ICD-10-CM

## 2014-01-29 LAB — GLUCOSE, CAPILLARY
Glucose-Capillary: 112 mg/dL — ABNORMAL HIGH (ref 70–99)
Glucose-Capillary: 150 mg/dL — ABNORMAL HIGH (ref 70–99)
Glucose-Capillary: 141 mg/dL — ABNORMAL HIGH (ref 70–99)
Glucose-Capillary: 134 mg/dL — ABNORMAL HIGH (ref 70–99)

## 2014-01-29 NOTE — Plan of Care (Signed)
Problem: SCI BLADDER ELIMINATION Goal: RH STG MANAGE BLADDER WITH ASSISTANCE STG Manage Bladder With total Assistance (foley in place)  Outcome: Progressing

## 2014-01-29 NOTE — Plan of Care (Signed)
Problem: RH PAIN MANAGEMENT Goal: RH STG PAIN MANAGED AT OR BELOW PT'S PAIN GOAL Pain less than 3  Outcome: Progressing

## 2014-01-29 NOTE — Progress Notes (Signed)
Physical Therapy Session Note  Patient Details  Name: Sarah [name] MRN: [id number] Date of Birth: Jul 27, 1965  Today's Date: 01/29/2014 PT Individual Time: 1430-1500 PT Individual Time Calculation (min): 30 min   Short Term Goals: Week 2:  PT Short Term Goal 1 (Week 2): Pt will scoot transfer bed to/from w/c with slideboard and 1 person assist PT Short Term Goal 2 (Week 2): Pt will propel w/c x 100' req SBA.  PT Short Term Goal 3 (Week 2): Pt will demonstrate ability to verbally instruct caregiver in dependent mechanical lift transfer.  PT Short Term Goal 4 (Week 2): Pt will tolerate standing frame x 1 minute.  PT Short Term Goal 5 (Week 2): Pt will demonstrate dynamic sit balance req SBA consistently.   Skilled Therapeutic Interventions/Progress Updates:    Pt received handed off from OT, agreeable to participate in therapy. Pt transferred into power w/c w/ use of MaxiMove lift. Pt propelled power w/c >200' w/ supervision, requiring assist from therapist only at end of session to back w/c into position in pt's room. Pt navigated through controlled environment (hospital hallway) and around obstacles in rehab gym, family room. Re-adjusted pt's lap belt to better fit pt to help keep hips back in seat. Pt left seated in w/c w/ all needs within reach w/ lap belt on.   Therapy Documentation Precautions:  Precautions Precautions: Fall, Back Precaution Comments: bil leg weakness, paraplegia Restrictions Weight Bearing Restrictions: No General:   Vital Signs: Therapy Vitals Temp: 98.4 F (36.9 C) Temp Source: Oral Pulse Rate: (!) 101 Resp: 20 BP: 138/75 mmHg Patient Position (if appropriate): Lying Oxygen Therapy SpO2: 97 % O2 Device: Not Delivered Pain:    Mobility:   Locomotion :    Trunk/Postural Assessment :    Balance:   Exercises:   Other Treatments:    See FIM for current functional status  Therapy/Group: Individual Therapy  Rada Hay  Rada Hay, PT,  DPT 01/29/2014, 7:51 AM

## 2014-01-29 NOTE — Progress Notes (Signed)
Sarah [name] is a 48 y.o. female May 13, 1965 [id number]  Subjective: No new complaints.  Slept well. Hemorrhoids are better  Objective: Vital signs in last 24 hours: Temp:  [97.8 F (36.6 C)-98.4 F (36.9 C)] 98.4 F (36.9 C) (11/08 0607) Pulse Rate:  [98-101] 101 (11/08 0607) Resp:  [18-20] 20 (11/08 0607) BP: (119-138)/(75-77) 138/75 mmHg (11/08 0607) SpO2:  [97 %-100 %] 97 % (11/08 0607) Weight change:  Last BM Date: 01/28/14  Intake/Output from previous day: 11/07 0701 - 11/08 0700 In: 1280 [P.O.:1280] Out: 1650 [Urine:1650] Last cbgs: CBG (last 3)   Recent Labs  01/28/14 1646 01/28/14 2050 01/29/14 0704  GLUCAP 146* 113* 112*     Physical Exam General: No apparent distress  Obese HEENT: not dry Lungs: Normal effort. Lungs clear to auscultation, no crackles or wheezes. Cardiovascular: Regular rate and rhythm, no edema Abdomen: S/NT/ND; BS(+) Musculoskeletal:  unchanged Neurological: No new neurological deficits Wounds: N/A    Skin: clear   Mental state: Alert, oriented, cooperative    Lab Results: BMET    Component Value Date/Time   NA 138 01/20/2014 0340   K 3.4* 01/20/2014 0340   CL 97 01/20/2014 0340   CO2 27 01/20/2014 0340   GLUCOSE 99 01/20/2014 0340   BUN 9 01/20/2014 0340   CREATININE 0.69 01/20/2014 0340   CALCIUM 8.4 01/20/2014 0340   GFRNONAA >90 01/20/2014 0340   GFRAA >90 01/20/2014 0340   CBC    Component Value Date/Time   WBC 4.9 01/20/2014 0340   RBC 3.31* 01/20/2014 0340   RBC 2.92* 01/05/2014 1305   HGB 9.2* 01/20/2014 0340   HCT 28.4* 01/20/2014 0340   PLT 343 01/20/2014 0340   MCV 85.8 01/20/2014 0340   MCH 27.8 01/20/2014 0340   MCHC 32.4 01/20/2014 0340   RDW 15.2 01/20/2014 0340   LYMPHSABS 1.9 01/20/2014 0340   MONOABS 0.5 01/20/2014 0340   EOSABS 0.1 01/20/2014 0340   BASOSABS 0.0 01/20/2014 0340    Studies/Results: No results found.  Medications: I have reviewed the patient's current  medications.  Assessment/Plan:  1. 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  2. DVT Prophylaxis/Anticoagulation: Pharmaceutical: Lovenox-will change to Xarelto per Hem/Onc input.  3. Pain Management: Denies back pain. But will need medication to help manage spacticity.  4. Mood: Reports problems sleeping due to current situation and prn ativan ineffective. She is willing to start low dose antidepressant. Will have chaplin follow for spiritual support as well as LCSW to follow for evaluation and support.  5. Neuropsych: This patient is capable of making decisions on her own behalf.  6. Skin/Wound Care: Air mattress overlay for prevention of break down. Start educating patient and family on need for pressure relief measures.  7. Fluids/Electrolytes/Nutrition: Monitor I/O. Offer supplements if intake poor. Routine check of lytes.  8. Neurogenic bladder: Will change out foley today--continue for now.  9. Neurogenic bowel: Will check KUB for baseline and start on bowel program.  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 For RAI at 6-8 weeks post op.  11. HTN: Will monitor every 8 hours. No medications as this time. Monitor for orthostatic changes once OOB.  12 PCOS? With hyperglycemia: Hgb A1c-6.7. Will modify diet to CM and have RD educate patient on restrictions.  13. E coli UTI--recurrent: continue Keflex D # 7/7.  14. Hemorrhoids: Anusol HC - helping  Cont current Rx     Length of stay,  days: [place] , MD 01/29/2014, 8:35 AM

## 2014-01-29 NOTE — Plan of Care (Signed)
Problem: RH SAFETY Goal: RH STG ADHERE TO SAFETY PRECAUTIONS W/ASSISTANCE/DEVICE STG Adhere to Safety Precautions With minimal Assistance/Device.  Outcome: Progressing

## 2014-01-29 NOTE — Plan of Care (Signed)
Problem: SCI BOWEL ELIMINATION Goal: RH STG MANAGE BOWEL WITH ASSISTANCE STG Manage Bowel with max Assistance.  Outcome: Progressing

## 2014-01-29 NOTE — Progress Notes (Signed)
  Radiation Oncology         [phone] ________________________________  Name: Sarah [name]  MRN: [id number]  Date: 01/26/2014  DOB: October 06, 1965       Weekly Radiation Therapy Management    ICD-9-CM ICD-10-CM   1. 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 . . . . . . . The patient presents for routine under treatment assessment.                                   The patient is without complaint.                                 Set-up films were reviewed.                                 The chart was checked. Physical Findings. . . . Weight essentially stable.  No significant changes. Impression . . . . . . . The patient is tolerating radiation. Plan . . . . . . . . . . . . Continue treatment as planned.  ________________________________  [name]. [name], M.D.

## 2014-01-29 NOTE — Progress Notes (Signed)
Occupational Therapy Session Note  Patient Details  Name: Sarah [name] MRN: [id number] Date of Birth: 09/26/1965  Today's Date: 01/29/2014 OT Individual Time: 1330-1400 OT Individual Time Calculation (min): 30 min    Short Term Goals: Week 2:  OT Short Term Goal 1 (Week 2): Pt will rise from supine to sitting at edge of bed with mod assist to manage LE OT Short Term Goal 2 (Week 2): Pt will demo ability to complete slide board transfer from bed to w/c with mod A X1  OT Short Term Goal 3 (Week 2): Pt will demo ability to use leg lifter to manage legs with min assist during bed mobility OT Short Term Goal 4 (Week 2): Pt will demo ability to sustain supported sitting balance at raised mat while performing a functional task for 5 minutes with only supervision for safety.  Skilled Therapeutic Interventions/Progress Updates: ADL-retraining with emphasis on improved bed mobility and management of LE during transitions using mechanical lift.   Pt received in bed and requesting assist with bowl management.   Pt deferred OT assist in favor of RN who provided total assist with clothing management and hygiene to cleanse and apply brief.   Pt then engaged in lower body dressing (TEDs and socks) and performed bed log rolls with overall min assist to bend and turn 1 leg, 2 times while donning sling.   Pt was able to direct caregivers throughout session to adjust equipment for comfort as needed,   Pt left in [place] at end of session with physical therapist arriving for next session.     Therapy Documentation Precautions:  Precautions Precautions: Fall, Back Precaution Comments: bil leg weakness, paraplegia Restrictions Weight Bearing Restrictions: No  Vital Signs: Therapy Vitals Temp: 98.4 F (36.9 C) Temp Source: Oral Pulse Rate: 93 Resp: 20 BP: 133/78 mmHg Patient Position (if appropriate): Lying Oxygen Therapy SpO2: 98 % O2 Device: Not Delivered  Pain: Pain Assessment Pain Score: 4  Pain  Location: Back Pain Orientation: Posterior Pain Intervention(s): Medication (See eMAR)  ADL: ADL ADL Comments: see FIM  See FIM for current functional status  Therapy/Group: Individual Therapy  Kamerin Axford 01/29/2014, 3:52 PM

## 2014-01-30 ENCOUNTER — Inpatient Hospital Stay (HOSPITAL_COMMUNITY): Payer: Medicaid Other

## 2014-01-30 ENCOUNTER — Inpatient Hospital Stay (HOSPITAL_COMMUNITY): Payer: Medicaid Other | Admitting: *Deleted

## 2014-01-30 ENCOUNTER — Ambulatory Visit
Admit: 2014-01-30 | Discharge: 2014-01-30 | Disposition: A | Discharge status: Home / Self Care | Payer: Medicaid Other | Attending: Radiation Oncology | Admitting: Radiation Oncology

## 2014-01-30 LAB — GLUCOSE, CAPILLARY
GLUCOSE-CAPILLARY: 139 mg/dL — AB (ref 70–99)
GLUCOSE-CAPILLARY: 123 mg/dL — AB (ref 70–99)
Glucose-Capillary: 124 mg/dL — ABNORMAL HIGH (ref 70–99)
Glucose-Capillary: 132 mg/dL — ABNORMAL HIGH (ref 70–99)

## 2014-01-30 MED ORDER — SENNOSIDES-DOCUSATE SODIUM 8.6-50 MG PO TABS
2.0000 | ORAL_TABLET | Freq: Every day | ORAL | Status: DC
  Administered 2014-01-31 – 2014-02-10 (×10): 2 via ORAL
  Filled 2014-01-30 (×12): qty 2

## 2014-01-30 NOTE — Progress Notes (Signed)
Occupational Therapy Session Note  Patient Details  Name: Sarah [name] MRN: [id number] Date of Birth: [birth year]  Today's Date: 01/30/2014 OT Individual Time: 1517-6160 OT Individual Time Calculation (min): 60 min    Short Term Goals: Week 2:  OT Short Term Goal 1 (Week 2): Pt will rise from supine to sitting at edge of bed with mod assist to manage LE OT Short Term Goal 2 (Week 2): Pt will demo ability to complete slide board transfer from bed to w/c with mod A X1  OT Short Term Goal 3 (Week 2): Pt will demo ability to use leg lifter to manage legs with min assist during bed mobility OT Short Term Goal 4 (Week 2): Pt will demo ability to sustain supported sitting balance at raised mat while performing a functional task for 5 minutes with only supervision for safety.  Skilled Therapeutic Interventions/Progress Updates: ADL-retraining with focus on caregiver direction, discharge planning, supine bathing, bed mobility, pain management.  Pt received supine in bed self-feeding although reporting new problem with swallowing when supine.   MD arrived for brief visit and OT reminded pt to alert MD to change in swallowing.   Pt reported no evening bath provided and requested assist with bathing for this session.   Pt was re-educated on assuming more directive role with caregivers as she frequently defaults/concedes to caregiver preferences or recommendation for positioning and clothing choices.   Pt was advised to state her preferences and refrain from statements like, "I don't care" or "it doesn't matter."   Pt required total assist for hygiene following small BM; RN alerted to provide medication for hemorrhoids.   Pt then performed multiple log rolls with min assist to manage legs after PROM with total assist required to bathe lower body and setup for upper body.   Pt has improved log rolls without use of leg lifter required to roll with min assist, although complicated by use of low air-loss mattress.    No family present for training this date as per pt, her mother was ill.   Plan to reinforce family attendance during BADL training at next session.     Therapy Documentation Precautions:  Precautions Precautions: Fall, Back Precaution Comments: bil leg weakness, paraplegia Restrictions Weight Bearing Restrictions: No  Pain: Pain Assessment Pain Assessment: 0-10 Pain Score: 4 Pain Type: Other (Comment) Pain Location: Throat Pain Orientation: Mid Pain Descriptors / Indicators: Contraction Pain Frequency: Intermittent Pain Onset: With Activity Pain Intervention(s): MD notified (Comment);Repositioned Multiple Pain Sites: No  ADL: ADL ADL Comments: see FIM  See FIM for current functional status  Therapy/Group: Individual Therapy  [facility] 01/30/2014, 10:05 AM

## 2014-01-30 NOTE — Progress Notes (Signed)
[facility] Therapy Treatment Record Phone [phone]   Radiation Therapy was administered to Sarah [name] on: 01/30/2014  3:32 PM and was treatment # 9 out of a planned course of 10 treatments.

## 2014-01-30 NOTE — Progress Notes (Signed)
Physical Therapy Session Note  Patient Details  Name: Sarah [name] MRN: [id number] Date of Birth: [birth year]  Today's Date: 01/30/2014 PT Individual Time: 0900-1000 PT Individual Time Calculation (min): 60 min   Session 2 Time: Time Calculation (min)  Short Term Goals: Week 2:  PT Short Term Goal 1 (Week 2): Pt will scoot transfer bed to/from w/c with slideboard and 1 person assist PT Short Term Goal 2 (Week 2): Pt will propel w/c x 100' req SBA.  PT Short Term Goal 3 (Week 2): Pt will demonstrate ability to verbally instruct caregiver in dependent mechanical lift transfer.  PT Short Term Goal 4 (Week 2): Pt will tolerate standing frame x 1 minute.  PT Short Term Goal 5 (Week 2): Pt will demonstrate dynamic sit balance req SBA consistently.   Skilled Therapeutic Interventions/Progress Updates:    Session 1: Pt received supine in bed, agreeable to participate in therapy. Pt rolled L/R w/ MinA for bending leg in order to dress LB. Pt able to talk through steps for moving supine>sit and direct care w/ min cueing. Pt performed sliding board transfers 3x (bed>w/c, w/c<>mat table) w/ +2A for lean forward, moving feet between scoots, and moving hips during scoot. Pt able to direct care for sliding board transfer w/ mod cueing. Therapist adjusted hip guides for improved positioning. Pt navigated w/c around rehab unit in controlled environment at 3/5 speed, educated on lowering speed when positioning chair for transfer or in room. Pt left seated in w/c w/ lap belt on and all needs within reach.   Session 2: Pt received seated in w/c, agreeable to participate in therapy. Pt propelled power w/c 150' to/from rehab gym w/ supervision, independently slowed down speed when approaching room/gym w/ no cueing. Pt set up w/c for sliding board transfer w/c>mat w/ minA. +2A for sliding board transfer w/c<>mat. Seated at edge of mat worked on seated balance/trunk control exercises w/ alternating isometrics (pt able  to take min resistance posterior, L/R, very little resistance from anterior direction), chest presses w/ 1# dowel, hitting beach ball w/ 1# dowel for anticipatory balance strategies. Pt required CGA to [place] to maintain seated balance during activities. Pt propelled power w/c back to room w/ supervision. Left seated in w/c w/ lap belt on, all needs within reach and NT present to check CBG.    Therapy Documentation Precautions:  Precautions Precautions: Fall, Back Precaution Comments: bil leg weakness, paraplegia Restrictions Weight Bearing Restrictions: No General:   Vital Signs: Therapy Vitals Temp: 97.7 F (36.5 C) Temp Source: Axillary Pulse Rate: 96 Resp: 20 Patient Position (if appropriate): Lying Oxygen Therapy SpO2: 97 % O2 Device: Not Delivered Pain: Pain Assessment Pain Score: 7   Provided back rub, rest, repositioned. Mobility:   Locomotion :    Trunk/Postural Assessment :    Balance:   Exercises:   Other Treatments:    See FIM for current functional status  Therapy/Group: Individual Therapy, Co-Treatment  Rada Hay  Rada Hay, PT, DPT 01/30/2014, 7:43 AM

## 2014-01-30 NOTE — Progress Notes (Signed)
Recreational Therapy Session Note  Patient Details  Name: Sarah [name] MRN: [id number] Date of Birth: 1966-02-22 Today's Date: 01/30/2014  Pain: 7/10 back pain, repositioning offered, back rub per request Skilled Therapeutic Interventions/Progress Updates: Session focused on activity tolerance, bed mobility, slide board transfers, sitting balance, directing her care, discharge planning & use of leisure time.  Pt performed slide board transfers x4 with +2 assist.  Pt required mod questioning cues to direct care.  Pt propelled power chair to & from room to & from gym with supervision.  While seated EOM, continued discussion with pt about use of leisure time post discharge.  Discussion focused on potential activities to participate in with 53 yo nephew.  Therapy/Group: Co-Treatment   Chayna Surratt 01/30/2014, 3:42 PM

## 2014-01-30 NOTE — Progress Notes (Signed)
48 y.o. female with history of HTN, chronic back pain, morbid obesity who was admitted on 12/10/13 with BLE weakness, inability to walk as well as problems with urinary retention and constipation. Work up revealed a lesion at the T3 level of the upper thoracic spine with pathologic compression fracture and epidural tumor resulting in moderate cord compression with evidence of mild spinal cord edema, as well as right suprahilar mass with pulmonary nodules with mediastinal lymph nodes c/w metastatic disease, RUL & RLL PE, tubular cystic masses bilateral adnexal regions and a massive thyroid goiter causing tracheal deviation to the right with narrowing of trachea. She was started on steriods and foley placed for urinary retention. She underwent thoracic laminectomy with excision of tumor on 09/23 by Dr. Christella Noa and on bedrest pending further surgery. Pathology favoring metastatic adenocarcinoma of lung but metastatic thyroid cancer in differential . 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 Buddy Duty consulted for input. 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 Patient underwent T1-T5 posterolateral arthrodesis with allograft on 10/09 with plans XRT to be initiated 2 weeks post op. Dr. Constance Holster consulted for input and patient underwent total thyroidectomy on 10/19 and extubated without difficulty on 10/20.  Subjective/Complaints: Complains of some difficulty swallowing solids. Has found positions which work better for her. Was upset about RN trying to work on bathing her at 0400 this morning. Pain under control.   Vital Signs: Blood pressure 133/78, pulse 96, temperature 97.7 F (36.5 C), temperature source Axillary, resp. rate 20, height 5\' 11"  (1.803 m), weight 128 kg (282 lb 3 oz), last menstrual period 12/06/2013, SpO2 97 %. No results found. Results for orders placed or performed during the hospital  encounter of 01/19/14 (from the past 72 hour(s))  Glucose, capillary     Status: Abnormal   Collection Time: 01/27/14 12:10 PM  Result Value Ref Range   Glucose-Capillary 130 (H) 70 - 99 mg/dL   Comment 1 Notify RN   Glucose, capillary     Status: Abnormal   Collection Time: 01/27/14  4:56 PM  Result Value Ref Range   Glucose-Capillary 147 (H) 70 - 99 mg/dL   Comment 1 Notify RN   Glucose, capillary     Status: Abnormal   Collection Time: 01/27/14  9:11 PM  Result Value Ref Range   Glucose-Capillary 152 (H) 70 - 99 mg/dL  Glucose, capillary     Status: Abnormal   Collection Time: 01/28/14  7:27 AM  Result Value Ref Range   Glucose-Capillary 140 (H) 70 - 99 mg/dL   Comment 1 Notify RN   Glucose, capillary     Status: Abnormal   Collection Time: 01/28/14 12:37 PM  Result Value Ref Range   Glucose-Capillary 136 (H) 70 - 99 mg/dL   Comment 1 Notify RN   Glucose, capillary     Status: Abnormal   Collection Time: 01/28/14  4:46 PM  Result Value Ref Range   Glucose-Capillary 146 (H) 70 - 99 mg/dL   Comment 1 Notify RN   Glucose, capillary     Status: Abnormal   Collection Time: 01/28/14  8:50 PM  Result Value Ref Range   Glucose-Capillary 113 (H) 70 - 99 mg/dL  Glucose, capillary     Status: Abnormal   Collection Time: 01/29/14  7:04 AM  Result Value Ref Range   Glucose-Capillary 112 (H) 70 - 99 mg/dL  Glucose, capillary  Status: Abnormal   Collection Time: 01/29/14 11:24 AM  Result Value Ref Range   Glucose-Capillary 150 (H) 70 - 99 mg/dL  Glucose, capillary     Status: Abnormal   Collection Time: 01/29/14  4:41 PM  Result Value Ref Range   Glucose-Capillary 141 (H) 70 - 99 mg/dL  Glucose, capillary     Status: Abnormal   Collection Time: 01/29/14  9:10 PM  Result Value Ref Range   Glucose-Capillary 134 (H) 70 - 99 mg/dL      Nursing note and vitals reviewed.  Constitutional: She is oriented to person, place, and time. She appears well-developed and well-nourished.   HENT:  Head: Normocephalic and atraumatic.  Eyes: Conjunctivae are normal. Pupils are equal, round, and reactive to light.  Neck: Normal range of motion.  Cardiovascular: Normal rate and regular rhythm.  No murmur heard.  Respiratory: Effort normal and breath sounds normal. No respiratory distress. She has no wheezes. She exhibits no tenderness (sternal area with radiation markings.).  GI: Soft. Bowel sounds are normal. She exhibits no distension. There is no tenderness.  Musculoskeletal: She exhibits no edema and no tenderness.  Neurological: She is alert and oriented to person, place, and time.   Dense paraparesis with flexor spasms and few beats clonus on the right. Diminished sensation 1/2 below lower/mid chest wall.   Skin: Skin is warm and dry.  Intergluteal and sacrum still a little irritated Psychiatric: She has a normal mood and affect. Her behavior is normal. Judgment and thought content normal.  Motor strength is 5/5 bilateral deltoids, biceps, triceps, grip  0/5 bilateral hip flexors knee extensors ankle dorsiflexor plantar flexor Tone 2/4 left greater than right LE's and fluctuating   Assessment/Plan: 1. Functional deficits secondary to Paraplegia which require 3+ hours per day of interdisciplinary therapy in a comprehensive inpatient rehab setting. Physiatrist is providing close team supervision and 24 hour management of active medical problems listed below. Physiatrist and rehab team continue to assess barriers to discharge/monitor patient progress toward functional and medical goals.    FIM: FIM - Bathing Bathing Steps Patient Completed: Chest, Right Arm, Left Arm, Abdomen, Right upper leg, Left upper leg Bathing: 3: Mod-Patient completes 5-7 55f 10 parts or 50-74%  FIM - Upper Body Dressing/Undressing Upper body dressing/undressing steps patient completed: Thread/unthread right sleeve of pullover shirt/dresss, Thread/unthread left sleeve of pullover shirt/dress, Put  head through opening of pull over shirt/dress Upper body dressing/undressing: 5: Set-up assist to: Obtain clothing/put away FIM - Lower Body Dressing/Undressing Lower body dressing/undressing: 1: Total-Patient completed less than 25% of tasks  FIM - Toileting Toileting: 1: Total-Patient completed zero steps, helper did all 3  FIM - Air cabin crew Transfers: 0-Activity did not occur  FIM - Control and instrumentation engineer Devices: Bed rails Bed/Chair Transfer: 1: Mechanical lift, 1: Two helpers  FIM - Locomotion: Wheelchair Distance: 200 Locomotion: Wheelchair: 5: Travels 150 ft or more: maneuvers on rugs and over door sills with supervision, cueing or coaxing FIM - Locomotion: Ambulation Locomotion: Ambulation: 0: Activity did not occur  Comprehension Comprehension Mode: Auditory Comprehension: 7-Follows complex conversation/direction: With no assist  Expression Expression Mode: Verbal Expression: 7-Expresses complex ideas: With no assist  Social Interaction Social Interaction: 7-Interacts appropriately with others - No medications needed.  Problem Solving Problem Solving: 7-Solves complex problems: Recognizes & self-corrects  Memory Memory: 7-Complete Independence: No helper  Medical Problem List and Plan:  1. 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   -has xrt 9/10 today 2. DVT Prophylaxis/Anticoagulation: Pharmaceutical: Xarelto per Hem/Onc input.  3. Pain Management: Denies back pain. But will need medication to help manage spacticity.  4. Mood:prn ativan for anxiety,       -pt aware of situational anxiety with therapy--some improvement? 5. Neuropsych: This patient is capable of making decisions on her own behalf.  6. Skin/Wound Care: Air mattress overlay for prevention of break down. Start educating patient and family on need for pressure relief measures.   7. Fluids/Electrolytes/Nutrition: encourage po intake..  .  8. Neurogenic bladder: foley 9. 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 For RAI at 6-8 weeks post op.  11. HTN: Will monitor every 8 hours. No medications as this time. Monitor for orthostatic changes once OOB.  12 PCOS? With hyperglycemia: Hgb A1c-6.7. [name] Kitchen   -sugars reasonable 13. E coli UTI--keflex complete 14. Hemorrhoids: continue proctofoam cream bid in addition to good rectal hygiene to prevent irritation from stool.  15. Spasticity: baclofen trial helpful   LOS (Days) 11 A FACE TO FACE EVALUATION WAS PERFORMED  SWARTZ,ZACHARY T 01/30/2014, 8:07 AM

## 2014-01-31 ENCOUNTER — Ambulatory Visit (HOSPITAL_COMMUNITY): Payer: Medicaid Other | Admitting: *Deleted

## 2014-01-31 ENCOUNTER — Inpatient Hospital Stay (HOSPITAL_COMMUNITY): Payer: Medicaid Other

## 2014-01-31 ENCOUNTER — Ambulatory Visit
Admit: 2014-01-31 | Discharge: 2014-01-31 | Disposition: A | Discharge status: Home / Self Care | Payer: Medicaid Other | Attending: Radiation Oncology | Admitting: Radiation Oncology

## 2014-01-31 ENCOUNTER — Encounter: Payer: Self-pay | Admitting: Radiation Oncology

## 2014-01-31 DIAGNOSIS — E1140 Type 2 diabetes mellitus with diabetic neuropathy, unspecified: Secondary | ICD-10-CM

## 2014-01-31 LAB — GLUCOSE, CAPILLARY
GLUCOSE-CAPILLARY: 137 mg/dL — AB (ref 70–99)
Glucose-Capillary: 137 mg/dL — ABNORMAL HIGH (ref 70–99)
Glucose-Capillary: 143 mg/dL — ABNORMAL HIGH (ref 70–99)
Glucose-Capillary: 161 mg/dL — ABNORMAL HIGH (ref 70–99)

## 2014-01-31 MED ORDER — BACLOFEN 10 MG PO TABS
10.0000 mg | ORAL_TABLET | Freq: Three times a day (TID) | ORAL | Status: DC
  Administered 2014-01-31 – 2014-02-10 (×29): 10 mg via ORAL
  Filled 2014-01-31 (×33): qty 1

## 2014-01-31 NOTE — Progress Notes (Signed)
Physical Therapy Session Note  Patient Details  Name: Sarah [name] MRN: [id number] Date of Birth: 1965/03/31  Today's Date: 01/31/2014 PT Individual Time: 0900-1000 PT Individual Time Calculation (min): 60 min   Session 2 Time: 5573-2202 Time Calculation (min): 30 min  Short Term Goals: Week 2:  PT Short Term Goal 1 (Week 2): Pt will scoot transfer bed to/from w/c with slideboard and 1 person assist PT Short Term Goal 2 (Week 2): Pt will propel w/c x 100' req SBA.  PT Short Term Goal 3 (Week 2): Pt will demonstrate ability to verbally instruct caregiver in dependent mechanical lift transfer.  PT Short Term Goal 4 (Week 2): Pt will tolerate standing frame x 1 minute.  PT Short Term Goal 5 (Week 2): Pt will demonstrate dynamic sit balance req SBA consistently.   Skilled Therapeutic Interventions/Progress Updates:    Session 1: Pt received seated in w/c, agreeable to participate in therapy. Pt propelled power w/c 150' around rehab unit w/ supervision, able to appropriately change speed based on environment. Pt set up chair for sliding board transfer w/ min cueing, directed care for transfer w/ mod question cueing. Pt continues to require +2A for sliding board transfer w/ Mod-Max from each therapist. Pt stood in standing frame for increasing sensory feedback and NMR for BLE. Tolerated standing 2' then 1:30 after seated rest break. Pt w/ sliding board transfer mat>w/c w/ +2A, directed transfer w/ min cueing. Pt propelled w/c back to room, left seated in w/c w/ lap belt on and all needs within reach.   Session 2: Pt received seated in w/c, agreeable to participate in therapy until [facility] came (radiation treatment was rescheduled to earlier in day). Pt allowed to choose how to transfer w/c>bed, pt chose to use lift after min encouragement to state her preference instead of allowing therapist to choose. Pt able to direct [employer] transfer w/ mod cueing. Once in bed pt rolled L/R to remove sling w/  MinA after setup of leg. Therapist provided PROM for hip flexion, knee flexion, hip IR/ER, ankle dorsiflexion for tone management. Pt missed 30 minutes of scheduled PT time due to rescheduling of radiation.     Therapy Documentation Precautions:  Precautions Precautions: Fall, Back Precaution Comments: bil leg weakness, paraplegia Restrictions Weight Bearing Restrictions: No General:   Vital Signs: Therapy Vitals Temp: 98.5 F (36.9 C) Temp Source: Oral Pulse Rate: 97 Resp: 20 BP: 126/64 mmHg Patient Position (if appropriate): Lying Oxygen Therapy SpO2: 98 % O2 Device: Not Delivered Pain:  Not rated, new R posterior flank pain over lat. Provided backrub and rest. See FIM for current functional status  Therapy/Group: Individual Therapy  Rada Hay  Rada Hay, PT, DPT 01/31/2014, 7:42 AM

## 2014-01-31 NOTE — Plan of Care (Signed)
Problem: RH Bed Mobility Goal: LTG Patient will perform bed mobility with assist (PT) LTG: Patient will perform bed mobility with assistance, with/without cues (PT).  Goal downgraded 11/10 due to lack of progress  Problem: RH Bed to Chair Transfers Goal: LTG Patient will perform bed/chair transfers w/assist (PT) LTG: Patient will perform bed/chair transfers with assistance, with/without cues (PT).  Goal downgraded due to lack of progress. Recommend use of lift equipment for transfers at discharge  Problem: RH Furniture Transfers Goal: LTG Patient will perform furniture transfers w/assist (OT/PT LTG: Patient will perform furniture transfers with assistance (OT/PT).  Outcome: Not Applicable Date Met:  01/31/14 Goal D/C'ed 11/10 due to lack of progress     

## 2014-01-31 NOTE — Plan of Care (Signed)
Problem: RH PAIN MANAGEMENT Goal: RH STG PAIN MANAGED AT OR BELOW PT'S PAIN GOAL Pain less than 3  Outcome: Progressing

## 2014-01-31 NOTE — Plan of Care (Signed)
Problem: SCI BLADDER ELIMINATION Goal: RH STG MANAGE BLADDER WITH ASSISTANCE STG Manage Bladder With total Assistance (foley in place)  Outcome: Progressing

## 2014-01-31 NOTE — Progress Notes (Signed)
48 y.o. female with history of HTN, chronic back pain, morbid obesity who was admitted on 12/10/13 with BLE weakness, inability to walk as well as problems with urinary retention and constipation. Work up revealed a lesion at the T3 level of the upper thoracic spine with pathologic compression fracture and epidural tumor resulting in moderate cord compression with evidence of mild spinal cord edema, as well as right suprahilar mass with pulmonary nodules with mediastinal lymph nodes c/w metastatic disease, RUL & RLL PE, tubular cystic masses bilateral adnexal regions and a massive thyroid goiter causing tracheal deviation to the right with narrowing of trachea. She was started on steriods and foley placed for urinary retention. She underwent thoracic laminectomy with excision of tumor on 09/23 by Dr. Christella Noa and on bedrest pending further surgery. Pathology favoring metastatic adenocarcinoma of lung but metastatic thyroid cancer in differential . 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 Buddy Duty consulted for input. 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 Patient underwent T1-T5 posterolateral arthrodesis with allograft on 10/09 with plans XRT to be initiated 2 weeks post op. Dr. Constance Holster consulted for input and patient underwent total thyroidectomy on 10/19 and extubated without difficulty on 10/20.  Subjective/Complaints: Still having some spasms. Some difficulty swallowing solids. Still able to manage.  Vital Signs: Blood pressure 126/64, pulse 97, temperature 98.5 F (36.9 C), temperature source Oral, resp. rate 20, height 5\' 11"  (1.803 m), weight 128 kg (282 lb 3 oz), last menstrual period 12/06/2013, SpO2 98 %. No results found. Results for orders placed or performed during the hospital encounter of 01/19/14 (from the past 72 hour(s))  Glucose, capillary     Status: Abnormal   Collection Time:  01/28/14 12:37 PM  Result Value Ref Range   Glucose-Capillary 136 (H) 70 - 99 mg/dL   Comment 1 Notify RN   Glucose, capillary     Status: Abnormal   Collection Time: 01/28/14  4:46 PM  Result Value Ref Range   Glucose-Capillary 146 (H) 70 - 99 mg/dL   Comment 1 Notify RN   Glucose, capillary     Status: Abnormal   Collection Time: 01/28/14  8:50 PM  Result Value Ref Range   Glucose-Capillary 113 (H) 70 - 99 mg/dL  Glucose, capillary     Status: Abnormal   Collection Time: 01/29/14  7:04 AM  Result Value Ref Range   Glucose-Capillary 112 (H) 70 - 99 mg/dL  Glucose, capillary     Status: Abnormal   Collection Time: 01/29/14 11:24 AM  Result Value Ref Range   Glucose-Capillary 150 (H) 70 - 99 mg/dL  Glucose, capillary     Status: Abnormal   Collection Time: 01/29/14  4:41 PM  Result Value Ref Range   Glucose-Capillary 141 (H) 70 - 99 mg/dL  Glucose, capillary     Status: Abnormal   Collection Time: 01/29/14  9:10 PM  Result Value Ref Range   Glucose-Capillary 134 (H) 70 - 99 mg/dL  Glucose, capillary     Status: Abnormal   Collection Time: 01/30/14  7:37 AM  Result Value Ref Range   Glucose-Capillary 124 (H) 70 - 99 mg/dL   Comment 1 Notify RN   Glucose, capillary     Status: Abnormal   Collection Time: 01/30/14 12:02 PM  Result Value Ref Range   Glucose-Capillary 139 (H) 70 - 99 mg/dL   Comment 1 Notify RN   Glucose, capillary  Status: Abnormal   Collection Time: 01/30/14  5:01 PM  Result Value Ref Range   Glucose-Capillary 123 (H) 70 - 99 mg/dL   Comment 1 Notify RN   Glucose, capillary     Status: Abnormal   Collection Time: 01/30/14  9:10 PM  Result Value Ref Range   Glucose-Capillary 132 (H) 70 - 99 mg/dL  Glucose, capillary     Status: Abnormal   Collection Time: 01/31/14  6:46 AM  Result Value Ref Range   Glucose-Capillary 137 (H) 70 - 99 mg/dL      Nursing note and vitals reviewed.  Constitutional: She is oriented to person, place, and time. She  appears well-developed and well-nourished.  HENT:  Head: Normocephalic and atraumatic.  Eyes: Conjunctivae are normal. Pupils are equal, round, and reactive to light.  Neck: Normal range of motion.  Cardiovascular: Normal rate and regular rhythm.  No murmur heard.  Respiratory: Effort normal and breath sounds normal. No respiratory distress. She has no wheezes. She exhibits no tenderness (sternal area with radiation markings.).  GI: Soft. Bowel sounds are normal. She exhibits no distension. There is no tenderness.  Musculoskeletal: She exhibits no edema and no tenderness.  Neurological: She is alert and oriented to person, place, and time.   Dense paraparesis with flexor spasms and 3 beats clonus on the right. Diminished sensation 1/2 below lower/mid chest wall.   Skin: Skin is warm and dry.  Intergluteal and sacrum still a little irritated Psychiatric: She has a normal mood and affect. Her behavior is normal. Judgment and thought content normal.  Motor strength is 5/5 bilateral deltoids, biceps, triceps, grip  0/5 bilateral hip flexors knee extensors ankle dorsiflexor plantar flexor Tone 2/4 left greater than right LE's and fluctuating   Assessment/Plan: 1. Functional deficits secondary to Paraplegia which require 3+ hours per day of interdisciplinary therapy in a comprehensive inpatient rehab setting. Physiatrist is providing close team supervision and 24 hour management of active medical problems listed below. Physiatrist and rehab team continue to assess barriers to discharge/monitor patient progress toward functional and medical goals.    FIM: FIM - Bathing Bathing Steps Patient Completed: Chest, Right Arm, Left Arm, Abdomen Bathing: 2: Max-Patient completes 3-4 13f 10 parts or 25-49%  FIM - Upper Body Dressing/Undressing Upper body dressing/undressing steps patient completed: Thread/unthread right sleeve of pullover shirt/dresss, Thread/unthread left sleeve of pullover  shirt/dress, Put head through opening of pull over shirt/dress Upper body dressing/undressing: 4: Min-Patient completed 75 plus % of tasks FIM - Lower Body Dressing/Undressing Lower body dressing/undressing: 1: Total-Patient completed less than 25% of tasks  FIM - Toileting Toileting: 1: Total-Patient completed zero steps, helper did all 3  FIM - Air cabin crew Transfers: 0-Activity did not occur  FIM - Control and instrumentation engineer Devices: Bed rails Bed/Chair Transfer: 2: Sit > Supine: Max A (lifting assist/Pt. 25-49%), 1: Mechanical lift, 1: Two helpers  FIM - Locomotion: Wheelchair Distance: 200 Locomotion: Wheelchair: 5: Travels 150 ft or more: maneuvers on rugs and over door sills with supervision, cueing or coaxing FIM - Locomotion: Ambulation Locomotion: Ambulation: 0: Activity did not occur  Comprehension Comprehension Mode: Auditory Comprehension: 7-Follows complex conversation/direction: With no assist  Expression Expression Mode: Verbal Expression: 7-Expresses complex ideas: With no assist  Social Interaction Social Interaction: 7-Interacts appropriately with others - No medications needed.  Problem Solving Problem Solving: 7-Solves complex problems: Recognizes & self-corrects  Memory Memory: 7-Complete Independence: No helper  Medical Problem List and Plan:  1. 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   -has xrt 10/10 today 2. DVT Prophylaxis/Anticoagulation: Pharmaceutical: Xarelto per Hem/Onc input.  3. Pain Management: increase baclofen to better manage spasticity 4. Mood:prn ativan for anxiety,       -pt aware of situational anxiety with therapy--some improvement? 5. Neuropsych: This patient is capable of making decisions on her own behalf.  6. Skin/Wound Care: Air mattress overlay for prevention of break down. Start educating patient and  family on need for pressure relief measures.  7. Fluids/Electrolytes/Nutrition: encourage po intake..  .  8. Neurogenic bladder: foley 9. 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 For RAI at 6-8 weeks post op.  11. HTN: Will monitor every 8 hours. No medications as this time. Monitor for orthostatic changes once OOB.  12 PCOS? With hyperglycemia: Hgb A1c-6.7.   -sugars reasonable 13. E coli UTI--keflex complete 14. Hemorrhoids: continue proctofoam cream bid in addition to good rectal hygiene to prevent irritation from stool.  15. Spasticity: baclofen trial helpful   LOS (Days) 12 A FACE TO FACE EVALUATION WAS PERFORMED  Daril Warga T 01/31/2014, 7:43 AM

## 2014-01-31 NOTE — Patient Care Conference (Signed)
Inpatient RehabilitationTeam Conference and Plan of Care Update Date: 01/31/2014   Time: 2:10 PM    Patient Name: Sarah [name]      Medical Record Number: [id number]  Date of Birth: 08-19-1965 Sex: Female         Room/Bed: 4M10C/4M10C-01 Payor Info: Payor: MEDICAID [place] / Plan: MEDICAID OF [place] / Product Type: *No Product type* /    Admitting Diagnosis: t3 incomplete para who receives radioation daily   Admit Date/Time:  01/19/2014  5:29 PM Admission Comments: No comment available   Primary Diagnosis:  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 Principal Problem: 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  Patient Active Problem List   Diagnosis Date Noted  . Diabetes 01/23/2014  . Constipation   . Neurogenic bowel   . 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 Metastatic cancer to spine 01/19/2014  . Rotator cuff tendonitis   . Acute postoperative respiratory failure 01/09/2014  . 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 Spine metastasis 12/15/2013  . Leg weakness, bilateral 12/13/2013  . Intractable low back pain 12/10/2013  . Back pain 12/10/2013    Expected Discharge Date: Expected Discharge Date: 02/10/14  Team Members Present: Physician leading conference: Dr. Alger Simons Social Worker Present: Lennart Pall, LCSW Nurse Present: Elliot Cousin, RN PT Present: Canary Brim, Lorriane Shire, PT OT Present: Willeen Cass, OT;Patricia Lissa Hoard, OT Other (Discipline and Name): Danne Baxter, RN [facility]) PPS Coordinator present : Daiva Nakayama, RN, [facility]     Current Status/Progress Goal Weekly Team Focus  Medical   spasticity still an issue. anxiety better? needs family ed  see prior  spasticity, establishing bowel program   Bowel/Bladder   Bowel program. Incontinent of bowel. LBM 01/30/14. Foley to straight drain with amber color urine  Managed bowel program  Discuss adjustment of bowel medication   Swallow/Nutrition/ Hydration             ADL's   Setup for upper  body B & D, max-total for lower body B & D, mech lift for transfers  Overall min A for upper body ADLs supine or sitting at w/c level  Bed mobility, direction of caregivers, transfers, dynamic sitting balance   Mobility   +2A for sliding board transfers, min cueing for directing care, Min-Mod for bed mobility depending on setup  S for dynamic sitting, bed mobility, MinA for bed<>chair and car transfers  sitting balance, transfers, directing care   Communication             Safety/Cognition/ Behavioral Observations            Pain   Back discomforf, Vicodin x 1 q 4hrs prn  <3  offer pain medication 1 hr prior to initial therapy session   Skin   Back incision approximated, OTA, healing appropriately.   No additonal skin breakdown  Assess skin q shift     Rehab Goals Patient on target to meet rehab goals: Yes *See Care Plan and progress notes for long and short-term goals.  Barriers to Discharge: see prior    Possible Resolutions to Barriers:  see prior    Discharge Planning/Teaching Needs:  home with sister and brother-in-law plus mother providing all needed assistance      Team Discussion:  Completion of radiation tx today.  Still with foley and working on bowel program.  Several  PT and OT goals downgraded.  Anticipate hoyer lift at home.  Need to begin family ed ASAP as expected  to be very involved.  SW to follow up.  Revisions to Treatment Plan:  Several goals downgraded   Continued Need for Acute Rehabilitation Level of Care: The patient requires daily medical management by a physician with specialized training in physical medicine and rehabilitation for the following conditions: Daily direction of a multidisciplinary physical rehabilitation program to ensure safe treatment while eliciting the highest outcome that is of practical value to the patient.: Yes Daily medical management of patient stability for increased activity during participation in an intensive rehabilitation  regime.: Yes Daily analysis of laboratory values and/or radiology reports with any subsequent need for medication adjustment of medical intervention for : Post surgical problems;Neurological problems  Sarah [name] 01/31/2014, 2:40 PM

## 2014-01-31 NOTE — Progress Notes (Signed)
[facility] Therapy Treatment Record Phone [phone]   Radiation Therapy was administered to Sarah [name] on: 01/31/2014  1:13 PM and was treatment # 10 out of a planned course of 10 treatments.

## 2014-01-31 NOTE — Progress Notes (Signed)
Occupational Therapy Session Note  Patient Details  Name: Sarah [name] MRN: [id number] Date of Birth: 29-Apr-1965  Today's Date: 01/31/2014 OT Individual Time: 0729-0829 OT Individual Time Calculation (min): 60 min   Short Term Goals: Week 2:  OT Short Term Goal 1 (Week 2): Pt will rise from supine to sitting at edge of bed with mod assist to manage LE OT Short Term Goal 2 (Week 2): Pt will demo ability to complete slide board transfer from bed to w/c with mod A X1  OT Short Term Goal 3 (Week 2): Pt will demo ability to use leg lifter to manage legs with min assist during bed mobility OT Short Term Goal 4 (Week 2): Pt will demo ability to sustain supported sitting balance at raised mat while performing a functional task for 5 minutes with only supervision for safety.  Skilled Therapeutic Interventions/Progress Updates: ADL-retraining with emphasis on bed mobility, log rolls, assisted lower body dressing, discharge planning, seated grooming.    Pt received supine in bed, receptive for treatment although requiring clarification of methods and goals of treatment and for need of family/caregivers present for training.   Pt then performed bed mobility as needed for assist with toilet hygiene and application of hemorrhoid medication by RN.  Pt completed multiple log rolls with only min assist to complete bed mobility for total assist with lower body dressing, directing caregivers appropriately during dressing and mech lift transfer to [place].   While seated in [place], pt completed hair care using shampoo cap and directing setup for continued self-feeding.   Pt acknowledged request to contact family members for educational sessions on use of DME, assisted supine bathing, and safe mechanical lift transfers.   Pt left in w/c continuing with self-feeding and with all needs within reach.    Therapy Documentation Precautions:  Precautions Precautions: Fall, Back Precaution Comments: bil leg weakness,  paraplegia Restrictions Weight Bearing Restrictions: No  Vital Signs: Therapy Vitals Temp: 98.5 F (36.9 C) Temp Source: Oral Pulse Rate: 97 Resp: 20 BP: 126/64 mmHg Patient Position (if appropriate): Lying Oxygen Therapy SpO2: 98 % O2 Device: Not Delivered  Pain: Pain Assessment Pain Score: 3   ADL: ADL ADL Comments: see FIM  See FIM for current functional status  Therapy/Group: Individual Therapy  Gracemarie Skeet 01/31/2014, 9:56 AM

## 2014-01-31 NOTE — Progress Notes (Signed)
  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    Narrative: The patient tolerated radiation treatment relatively well.   She had minimal esophageal symptoms.  Plan: The patient has completed radiation treatment. The patient will return to radiation oncology clinic for routine followup in one month. I advised them to call or return sooner if they have any questions or concerns related to their recovery or treatment. ________________________________  [name]. [name], M.D.

## 2014-01-31 NOTE — Progress Notes (Signed)
Recreational Therapy Session Note  Patient Details  Name: Sarah [name] MRN: [id number] Date of Birth: 11/20/1965 Today's Date: 01/31/2014  Pain: c/o back pain, repositioning & back rub assisted with relief Skilled Therapeutic Interventions/Progress Updates: Session focused on activity tolerance, functional mobility using power w/c, slide board transfers, directing care, standing tolerance in standing frame.  Pt performed slide board transfers with +2 assist.  Pt performed w/c mobility on unit using power w/c with supervision, min questioning cues for setting up w/c for transfers.  Pt stood in standing frame x2, 2 minutes then 1.5 min, cues for encouragement.  Provided handout on how to build a ramp to assist family in proper planning.  Discussion with pt about discharge planning, need for family education, and potential use of hoyer lift.  Therapy/Group: Co-Treatment  Edinson Domeier 01/31/2014, 11:57 AM

## 2014-01-31 NOTE — Plan of Care (Signed)
Problem: RH SAFETY Goal: RH STG ADHERE TO SAFETY PRECAUTIONS W/ASSISTANCE/DEVICE STG Adhere to Safety Precautions With minimal Assistance/Device.  Outcome: Not Progressing

## 2014-01-31 NOTE — Plan of Care (Signed)
Problem: SCI BOWEL ELIMINATION Goal: RH STG MANAGE BOWEL WITH ASSISTANCE STG Manage Bowel with max Assistance.  Outcome: Progressing

## 2014-02-01 ENCOUNTER — Inpatient Hospital Stay (HOSPITAL_COMMUNITY): Payer: Medicaid Other

## 2014-02-01 ENCOUNTER — Encounter (HOSPITAL_COMMUNITY): Payer: Medicaid Other | Admitting: *Deleted

## 2014-02-01 ENCOUNTER — Inpatient Hospital Stay (HOSPITAL_COMMUNITY): Payer: Medicaid Other | Admitting: Physical Therapy

## 2014-02-01 LAB — GLUCOSE, CAPILLARY
GLUCOSE-CAPILLARY: 116 mg/dL — AB (ref 70–99)
GLUCOSE-CAPILLARY: 139 mg/dL — AB (ref 70–99)
Glucose-Capillary: 144 mg/dL — ABNORMAL HIGH (ref 70–99)
Glucose-Capillary: 103 mg/dL — ABNORMAL HIGH (ref 70–99)

## 2014-02-01 LAB — BASIC METABOLIC PANEL
Anion gap: 11 (ref 5–15)
BUN: 8 mg/dL (ref 6–23)
CALCIUM: 8.4 mg/dL (ref 8.4–10.5)
CO2: 27 mEq/L (ref 19–32)
Chloride: 98 mEq/L (ref 96–112)
Creatinine, Ser: 0.66 mg/dL (ref 0.50–1.10)
GFR calc non Af Amer: >90 mL/min (ref >90)
Glucose, Bld: 109 mg/dL — ABNORMAL HIGH (ref 70–99)
POTASSIUM: 3.7 meq/L (ref 3.7–5.3)
SODIUM: 136 meq/L — AB (ref 137–147)

## 2014-02-01 LAB — CBC
HCT: 28.4 % — ABNORMAL LOW (ref 36.0–46.0)
Hemoglobin: 9.3 g/dL — ABNORMAL LOW (ref 12.0–15.0)
MCH: 28.4 pg (ref 26.0–34.0)
MCHC: 32.7 g/dL (ref 30.0–36.0)
MCV: 86.6 fL (ref 78.0–100.0)
Platelets: 217 10*3/uL (ref 150–400)
RBC: 3.28 MIL/uL — AB (ref 3.87–5.11)
RDW: 15.2 % (ref 11.5–15.5)
WBC: 5.3 10*3/uL (ref 4.0–10.5)

## 2014-02-01 NOTE — Progress Notes (Signed)
48 y.o. female with history of HTN, chronic back pain, morbid obesity who was admitted on 12/10/13 with BLE weakness, inability to walk as well as problems with urinary retention and constipation. Work up revealed a lesion at the T3 level of the upper thoracic spine with pathologic compression fracture and epidural tumor resulting in moderate cord compression with evidence of mild spinal cord edema, as well as right suprahilar mass with pulmonary nodules with mediastinal lymph nodes c/w metastatic disease, RUL & RLL PE, tubular cystic masses bilateral adnexal regions and a massive thyroid goiter causing tracheal deviation to the right with narrowing of trachea. She was started on steriods and foley placed for urinary retention. She underwent thoracic laminectomy with excision of tumor on 09/23 by Dr. Christella Noa and on bedrest pending further surgery. Pathology favoring metastatic adenocarcinoma of lung but metastatic thyroid cancer in differential . 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 Buddy Duty consulted for input. 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 Patient underwent T1-T5 posterolateral arthrodesis with allograft on 10/09 with plans XRT to be initiated 2 weeks post op. Dr. Constance Holster consulted for input and patient underwent total thyroidectomy on 10/19 and extubated without difficulty on 10/20.  Subjective/Complaints: Had loose stools, didn't feel last night. Nausea, no vomiting   Vital Signs: Blood pressure 111/66, pulse 88, temperature 97.8 F (36.6 C), temperature source Oral, resp. rate 20, height 5\' 11"  (1.803 m), weight 128 kg (282 lb 3 oz), last menstrual period 12/06/2013, SpO2 97 %. No results found. Results for orders placed or performed during the hospital encounter of 01/19/14 (from the past 72 hour(s))  Glucose, capillary     Status: Abnormal   Collection Time: 01/29/14 11:24 AM   Result Value Ref Range   Glucose-Capillary 150 (H) 70 - 99 mg/dL  Glucose, capillary     Status: Abnormal   Collection Time: 01/29/14  4:41 PM  Result Value Ref Range   Glucose-Capillary 141 (H) 70 - 99 mg/dL  Glucose, capillary     Status: Abnormal   Collection Time: 01/29/14  9:10 PM  Result Value Ref Range   Glucose-Capillary 134 (H) 70 - 99 mg/dL  Glucose, capillary     Status: Abnormal   Collection Time: 01/30/14  7:37 AM  Result Value Ref Range   Glucose-Capillary 124 (H) 70 - 99 mg/dL   Comment 1 Notify RN   Glucose, capillary     Status: Abnormal   Collection Time: 01/30/14 12:02 PM  Result Value Ref Range   Glucose-Capillary 139 (H) 70 - 99 mg/dL   Comment 1 Notify RN   Glucose, capillary     Status: Abnormal   Collection Time: 01/30/14  5:01 PM  Result Value Ref Range   Glucose-Capillary 123 (H) 70 - 99 mg/dL   Comment 1 Notify RN   Glucose, capillary     Status: Abnormal   Collection Time: 01/30/14  9:10 PM  Result Value Ref Range   Glucose-Capillary 132 (H) 70 - 99 mg/dL  Glucose, capillary     Status: Abnormal   Collection Time: 01/31/14  6:46 AM  Result Value Ref Range   Glucose-Capillary 137 (H) 70 - 99 mg/dL  Glucose, capillary     Status: Abnormal   Collection Time: 01/31/14 11:55 AM  Result Value Ref Range   Glucose-Capillary 143 (H) 70 - 99 mg/dL   Comment 1 Notify RN   Glucose, capillary  Status: Abnormal   Collection Time: 01/31/14  4:48 PM  Result Value Ref Range   Glucose-Capillary 161 (H) 70 - 99 mg/dL  Glucose, capillary     Status: Abnormal   Collection Time: 01/31/14  8:39 PM  Result Value Ref Range   Glucose-Capillary 137 (H) 70 - 99 mg/dL   Comment 1 Notify RN   Glucose, capillary     Status: Abnormal   Collection Time: 02/01/14  6:19 AM  Result Value Ref Range   Glucose-Capillary 116 (H) 70 - 99 mg/dL   Comment 1 Notify RN       Nursing note and vitals reviewed.  Constitutional: She is oriented to person, place, and time. She  appears well-developed and well-nourished.  HENT:  Head: Normocephalic and atraumatic.  Eyes: Conjunctivae are normal. Pupils are equal, round, and reactive to light.  Neck: Normal range of motion.  Cardiovascular: Normal rate and regular rhythm.  No murmur heard.  Respiratory: Effort normal and breath sounds normal. No respiratory distress. She has no wheezes. She exhibits no tenderness (sternal area with radiation markings.).  GI: Soft. Bowel sounds are normal. She exhibits no distension. There is no tenderness.  Musculoskeletal: She exhibits no edema and no tenderness.  Neurological: She is alert and oriented to person, place, and time.   Dense paraparesis with flexor spasms and 3 beats clonus on the right. Diminished sensation 1/2 below lower/mid chest wall.   Skin: Skin is warm and dry.  Intergluteal and sacrum still a little irritated Psychiatric: She has a normal mood and affect. Her behavior is normal. Judgment and thought content normal.  Motor strength is 5/5 bilateral deltoids, biceps, triceps, grip  0/5 bilateral hip flexors knee extensors ankle dorsiflexor plantar flexor Tone 2/4 left greater than right LE's and fluctuating   Assessment/Plan: 1. Functional deficits secondary to Paraplegia which require 3+ hours per day of interdisciplinary therapy in a comprehensive inpatient rehab setting. Physiatrist is providing close team supervision and 24 hour management of active medical problems listed below. Physiatrist and rehab team continue to assess barriers to discharge/monitor patient progress toward functional and medical goals.    FIM: FIM - Bathing Bathing Steps Patient Completed: Chest, Right Arm, Left Arm, Abdomen Bathing: 2: Max-Patient completes 3-4 43f 10 parts or 25-49%  FIM - Upper Body Dressing/Undressing Upper body dressing/undressing steps patient completed: Thread/unthread right sleeve of pullover shirt/dresss, Thread/unthread left sleeve of pullover  shirt/dress, Put head through opening of pull over shirt/dress Upper body dressing/undressing: 4: Min-Patient completed 75 plus % of tasks FIM - Lower Body Dressing/Undressing Lower body dressing/undressing: 1: Total-Patient completed less than 25% of tasks  FIM - Toileting Toileting: 1: Total-Patient completed zero steps, helper did all 3  FIM - Air cabin crew Transfers: 0-Activity did not occur  FIM - Control and instrumentation engineer Devices: Bed rails Bed/Chair Transfer: 1: Mechanical lift, 1: Two helpers  FIM - Locomotion: Wheelchair Distance: 150 Locomotion: Wheelchair: 5: Travels 150 ft or more: maneuvers on rugs and over door sills with supervision, cueing or coaxing FIM - Locomotion: Ambulation Locomotion: Ambulation: 0: Activity did not occur  Comprehension Comprehension Mode: Auditory Comprehension: 7-Follows complex conversation/direction: With no assist  Expression Expression Mode: Verbal Expression: 7-Expresses complex ideas: With no assist  Social Interaction Social Interaction: 7-Interacts appropriately with others - No medications needed.  Problem Solving Problem Solving: 7-Solves complex problems: Recognizes & self-corrects  Memory Memory: 7-Complete Independence: No helper  Medical Problem List and Plan:  1. 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   -xrt completed 2. DVT Prophylaxis/Anticoagulation: Pharmaceutical: Xarelto per Hem/Onc input.  3. Pain Management: increase baclofen to better manage spasticity 4. Mood:prn ativan for anxiety,       -pt aware of situational anxiety with therapy--some improvement? 5. Neuropsych: This patient is capable of making decisions on her own behalf.  6. Skin/Wound Care: Air mattress overlay for prevention of break down. Start educating patient and family on need for pressure relief measures.  7.  Fluids/Electrolytes/Nutrition: encourage po intake..  .  8. Neurogenic bladder: foley 9. Neurogenic bowel/GI: glycerin suppository at HS  - getting closer to HS schedule  -reduce senokot s to help with stool consistency---2 qam only now  -GI sx last night, ?viral enteritis. Afebrile. 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 For RAI at 6-8 weeks post op.  11. HTN: Will monitor every 8 hours. No medications as this time. Monitor for orthostatic changes once OOB.  12 PCOS? With hyperglycemia: Hgb A1c-6.7.   -sugars reasonable 13. E coli UTI--keflex complete 14. Hemorrhoids: continue proctofoam cream bid in addition to good rectal hygiene to prevent irritation from stool.  15. Spasticity: baclofen trial helpful  -may need to consider decreasing if nausea persistent   LOS (Days) 13 A FACE TO FACE EVALUATION WAS PERFORMED  SWARTZ,ZACHARY T 02/01/2014, 7:40 AM

## 2014-02-01 NOTE — Progress Notes (Signed)
Occupational Therapy Session Note  Patient Details  Name: THAI HEMRICK MRN: [id number] Date of Birth: Jan 24, 1966  Today's Date: 02/01/2014 OT Individual Time: 1000-1025 OT Individual Time Calculation (min): 25 min    Short Term Goals: Week 2:  OT Short Term Goal 1 (Week 2): Pt will rise from supine to sitting at edge of bed with mod assist to manage LE OT Short Term Goal 2 (Week 2): Pt will demo ability to complete slide board transfer from bed to w/c with mod A X1  OT Short Term Goal 3 (Week 2): Pt will demo ability to use leg lifter to manage legs with min assist during bed mobility OT Short Term Goal 4 (Week 2): Pt will demo ability to sustain supported sitting balance at raised mat while performing a functional task for 5 minutes with only supervision for safety.  Skilled Therapeutic Interventions/Progress Updates:    Pt asleep in bed upon arrival.  Pt stated she had been up all night with nausea and vomiting and still didn't feel good.  Pt agreed to washing up but declined donning clothing today secondary to diarrhea.  Pt completed UB bathing but required tot A for LB bathing.  Pt rolled to left with min A to facilitate therapist bathing buttocks. Pt stated she couldn't continue secondary to fatigue and still not feeling good.  Focus on activity tolerance and bed mobility.  Therapy Documentation Precautions:  Precautions Precautions: Fall, Back Precaution Comments: bil leg weakness, paraplegia Restrictions Weight Bearing Restrictions: No General: General OT Amount of Missed Time: 35 Minutes  Pt c/o nausea/vomiting previous night and into morning; pt did not sleep previous night Pain: Pain Assessment Pain Assessment: 0-10 Pain Score: 5  Pain Type: Acute pain Pain Location: Abdomen Pain Orientation: Upper;Mid Pain Descriptors / Indicators: Other (Comment) (from nausea) Pain Onset: Gradual Pain Intervention(s): RN made aware;Repositioned Multiple Pain Sites:  No ADL: ADL ADL Comments: see FIM  See FIM for current functional status  Therapy/Group: Individual Therapy  Leroy Libman 02/01/2014, 10:36 AM

## 2014-02-01 NOTE — Progress Notes (Signed)
Social Work Patient ID: Sarah [name], female   DOB: 29-Jul-1965, 48 y.o.   MRN: [id number]   Met with pt and sister today to review team conference and discuss d/c planning issues.  Both aware that team still targeting d/c date of 11/20 with mod assist w/c level goals (at best).  Discussed need to begin family training ASAP.  Sister with questions about plans for pt following end of CIR.  Discussed HH therapies, PCS services (aide) and DME all to be arranged.  Sister then asks about possibility of pt transfer to [facility] or [facility].  Have explained that transfer to another CIR is not an option covered under insurance.  Transfer to SNF is a valid option and a decision that they will need to make with pt.  Also pointed out that I could not guarantee [facility] and would have to perform larger area bed search given that Medicaid is only payor. During discussion about SNF option, sister points out that she is to be primary caregiver along with pt's mother.  Adds that mother is in her 32's and she (sister) is scheduled to have shoulder surgery on 11/20 (pt's d/c day).  Sister then very tearful about having to even consider SNF.  Pt also tearful as they were providing support to one another.  Sister states, "I want to be able to take care of you." Explained to both that we need to start with hands on training and see how this goes as we discuss further the d/c plans.  Pt and sister are agreed to continue discussing what they feel family can realistically provide in home and are aware that SNF option remains on table until d/c.  Sister and mother to begin training tomorrow morning.  I will follow up with them at then end of the sessions to discuss further.  Continue to follow for support and d/c planning.  Callaway Hardigree, LCSW

## 2014-02-01 NOTE — Progress Notes (Signed)
Physical Therapy Session Note  Patient Details  Name: Sarah [name] MRN: [id number] Date of Birth: 23-Sep-1965  Today's Date: 02/01/2014 PT Individual Time: 0830-0930 Treatment Session 2:1500-1600 PT Individual Time Calculation (min): 60 min Treatment Session 2: 60 min  Short Term Goals: Week 1:  PT Short Term Goal 1 (Week 1): Pt will roll R and L in bed without rail req min A.  PT Short Term Goal 1 - Progress (Week 1): Updated due to goal met PT Short Term Goal 2 (Week 1): Pt will demonstrate supine to sit transfer with rail and 1 person assist.  PT Short Term Goal 2 - Progress (Week 1): Updated due to goal met PT Short Term Goal 3 (Week 1): Pt will scoot transfer bed to/from w/c with slideboard and 1 person assist PT Short Term Goal 3 - Progress (Week 1): Progressing toward goal PT Short Term Goal 4 (Week 1): Pt will initiate w/c propulsion.  PT Short Term Goal 4 - Progress (Week 1): Updated due to goal met PT Short Term Goal 5 (Week 1): Pt will tolerate dynamic sit balance req mod A.  PT Short Term Goal 5 - Progress (Week 1): Updated due to goal met  Skilled Therapeutic Interventions/Progress Updates:    Treatment Session 1: Pt reports c/o nausea and vomiting during the prior night and refuses oob to w/c. Pt agreeable to leg exercises in bed. PT began asking pt questions re: which home she will d/c to and pt reports she does not want to talk about it right now.   Therapeutic Exercise: PT completes PROM to B LEs in gravity minimized positions: ankle PF/DF, knee flexion/extension, hip flexion/extension, hip abduction/adduction, hip IR/ER x 10 reps to B LEs.   Therapeutic Activity: PT instructs pt in rolling R and L in bed req min A with grab bar for positioning of above LE exercises.  PT instructs pt in L side lie to sit transfer with bedrail req min-mod A.  PT instructs pt in sit to L side lie to supine transfer req mod A for B LEs. PT instructs pt in rolling R in bed with bedrail  req min A to change incontinence pads underneath her.   Neuromuscular Reeducation: PT instructs pt in static and dynamic sitting balance activities edge of bed - occasionally with use of L grab bar, progressing to pt's hand on PT's gait belt (around PT's waist) and L hand on edge of bed: cervical AROM exercises in all planes x 10 reps, scapular retraction exercise x 10 reps, spinal extension towards neutral (neutral not achieved) x 10 reps. Pt req up to min A for dynamic sitting balance, but is able to sit statically with close SBA-CGA. Above exercises were to promote more normalized sitting posture and decrease pt's spine stiffness from constantly rounded/slouched position.  PT instructs pt in leaning laterally on R and L elbow x 10 reps each req min A to L and CGA to R.   Pt req max encouragement to participate in PT session in AM, but pt eventually participated up to edge of bed activities. Pt c/o nausea but no vomiting during AM session. Pt is able to complete bed mobility with 1 person assist at this time. Pt ended in bed, comfortable, in partial L side lie with all needs in reach.   Treatment Session 2: Pt propels power w/c mod I on unit with joystick.  Therapeutic Activity: PT instructs pt in scoot transfer with slideboard from w/c to/from mat req mod  cues for pt to direct set up and mod A x 2 for scooting due to pt fear of leaning forward and B UE weakness. Pt very close to a one person scoot transfer mat to w/c, but continues to req a +2 assist for safety.   Neuromuscular Reeducation: PT instructs pt in dynamic sit balance activities: posterior prop sit to forward prop sit x 10 reps req verbal cues for technique and SBA for safety. PT instructs pt to avoid middle "wobble" ground and pt demonstrates increased speed of activity compared to prior week.  PT instructs pt in forward weight shift exercise while sitting edge of mat: crawling arms down on her knees, then therapist's knees (which were  lower to the ground), then reaching towards her feet, and crawling hands back up req min A for balance x 10 reps.   Pt is slowly progressing in overcoming fear of leaning forward, which is a huge barrier to her being able to scoot transfer with the slideboard with 1 person assist. Pt's older sister present at beginning of PM session and reports that pt will be d/cing home to her place and family will be adding a w/c accessible room to their house, as well as a ramp and w/c accessible bathroom. Continue per PT POC.   Therapy Documentation Precautions:  Precautions Precautions: Fall, Back Precaution Comments: bil leg weakness, paraplegia Restrictions Weight Bearing Restrictions: No Pain: Pain Assessment Pain Assessment: 0-10 Pain Score: 5  Pain Type: Acute pain Pain Location: Abdomen Pain Descriptors / Indicators: Other (Comment) (from nausea) Pain Onset: Gradual Pain Intervention(s): Repositioned Multiple Pain Sites: No Treatment Session 2: Pt c/o 3/10 pain in back and PT utilizes repositioning to decrease pain.   See FIM for current functional status  Therapy/Group: Individual Therapy  HYSLOP,AMANDA M 02/01/2014, 8:41 AM

## 2014-02-01 NOTE — Progress Notes (Signed)
Physical Therapy Session Note  Patient Details  Name: Sarah [name] MRN: [id number] Date of Birth: 16-Aug-1965  Today's Date: 02/01/2014 PT Individual Time: 1300-1400 PT Individual Time Calculation (min): 60 min   Short Term Goals: Week 2:  PT Short Term Goal 1 (Week 2): Pt will scoot transfer bed to/from w/c with slideboard and 1 person assist PT Short Term Goal 2 (Week 2): Pt will propel w/c x 100' req SBA.  PT Short Term Goal 3 (Week 2): Pt will demonstrate ability to verbally instruct caregiver in dependent mechanical lift transfer.  PT Short Term Goal 4 (Week 2): Pt will tolerate standing frame x 1 minute.  PT Short Term Goal 5 (Week 2): Pt will demonstrate dynamic sit balance req SBA consistently.   Skilled Therapeutic Interventions/Progress Updates:    Pt received supine in bed, agreeable to participate in therapy. Pt allowed to direct care in regards to transfer technique, pt agreed to use [facility] lift to transfer into chair. Pt rolled L/R w/ MinA in order to place brief and place MaxiMove sling. Pt able to direct care w/ mod questioning cueing. Pt transferred w/ TotalA via [facility] lift. Pt propelled w/c >200' around rehab unit, hospital lobby, and community environment outside w/ modified independence, including pushing appropriate elevator buttons from wheelchair. Discussed wheelchair transportation with pt and need for transport van at home. Pt left seated in w/c w/ sister present and all needs within reach.   Therapy Documentation Precautions:  Precautions Precautions: Fall, Back Precaution Comments: bil leg weakness, paraplegia Restrictions Weight Bearing Restrictions: No Vital Signs: Therapy Vitals Temp: 97.8 F (36.6 C) Temp Source: Oral Pulse Rate: 88 Resp: 20 BP: 111/66 mmHg Patient Position (if appropriate): Lying Oxygen Therapy SpO2: 97 % O2 Device: Not Delivered Pain: Pain Assessment Pain Assessment: 0-10 Pain Score: 4  Pain Type: Chronic pain Pain  Location: Back Pain Orientation: Mid;Upper Pain Descriptors / Indicators: Aching Pain Onset: On-going Pain Intervention(s): Back rub Multiple Pain Sites: No  See FIM for current functional status  Therapy/Group: Individual Therapy  Rada Hay  Rada Hay, PT, DPT 02/01/2014, 7:33 AM

## 2014-02-02 ENCOUNTER — Inpatient Hospital Stay (HOSPITAL_COMMUNITY): Payer: Medicaid Other | Admitting: Physical Therapy

## 2014-02-02 ENCOUNTER — Inpatient Hospital Stay (HOSPITAL_COMMUNITY): Payer: Medicaid Other

## 2014-02-02 ENCOUNTER — Telehealth: Payer: Self-pay | Admitting: *Deleted

## 2014-02-02 LAB — GLUCOSE, CAPILLARY
GLUCOSE-CAPILLARY: 123 mg/dL — AB (ref 70–99)
Glucose-Capillary: 122 mg/dL — ABNORMAL HIGH (ref 70–99)
Glucose-Capillary: 118 mg/dL — ABNORMAL HIGH (ref 70–99)
Glucose-Capillary: 104 mg/dL — ABNORMAL HIGH (ref 70–99)

## 2014-02-02 MED ORDER — SUCRALFATE 1 GM/10ML PO SUSP
1.0000 g | Freq: Three times a day (TID) | ORAL | Status: DC
  Administered 2014-02-02 – 2014-02-10 (×29): 1 g via ORAL
  Filled 2014-02-02 (×36): qty 10

## 2014-02-02 MED ORDER — FAMOTIDINE 10 MG PO TABS
10.0000 mg | ORAL_TABLET | Freq: Two times a day (BID) | ORAL | Status: DC
  Administered 2014-02-03 – 2014-02-08 (×10): 10 mg via ORAL
  Filled 2014-02-02 (×14): qty 1

## 2014-02-02 MED ORDER — ONDANSETRON 4 MG PO TBDP
4.0000 mg | ORAL_TABLET | Freq: Three times a day (TID) | ORAL | Status: DC | PRN
  Administered 2014-02-02 – 2014-02-03 (×2): 4 mg via ORAL
  Filled 2014-02-02 (×4): qty 1

## 2014-02-02 MED ORDER — ONDANSETRON HCL 8 MG PO TABS
8.0000 mg | ORAL_TABLET | Freq: Once | ORAL | Status: DC
  Filled 2014-02-02: qty 1
  Filled 2014-02-02: qty 2

## 2014-02-02 NOTE — Consult Note (Signed)
NEUROCOGNITIVE [place]   [name] is a 48 year old woman, who was seen for a brief neurocognitive status examination to evaluate her emotional state and mental status in the setting of metastatic cancer.  According to her medical record, she was admitted on 12/10/13 with bilateral lower extremity weakness, inability to walk, and problems with urinary retention and constipation.  Workup revealed a thoracic spine compression fracture and epidural tumor resulting in moderate cord compression.  There was evidence of other metastatic disease as well.  She underwent T1-T5 posterolateral arthrodesis with allograft on 10/9 and then underwent total thyroidectomy on 10/19 and was extubated without issue on 10/20.  Due to continued spinal issues, inpatient rehabilitation was recommended.    Emotional Functioning:  During the clinical interview, Ms. Stansell repeatedly described feeling "overwhelmed" and frustrated by her medical situation.  She stated that there are times when she feels as though if action was taken sooner, that her prognosis would be better.  However, she commented that most of her difficulty coping stems from not knowing what the future holds.  Despite feelings of frustration and being overwhelmed, Ms. Alred said that she only feels down about her situation for moments at a time.  She explained that when those moments occur, she allows herself to cry and vent to her mother and then she feels better.  Ms. Weatherbee said that she has a good support system and that her low moods do not persist.  She denied other symptoms of major depression, including suicidal ideation and she said that she does not feel as though her low moods have adversely impacted her motivation and ability to participate in various therapies.  Aside from low mood at times, Ms. Coronado stated that she has fears of falling, which become prevalent during physical therapy,  but she said that she is able to "block" those thoughts and give it her best such that she does not believe her fears are holding back her physical recovery.  Finally, Ms. Gunn mentioned that she has been significantly fatigued during the day due to her suppository keeping her awake at night.  Despite the aforementioned complaints, Ms. Wojtaszek said that overall, she is doing "pretty good."    Ms. Paske's responses to a self-report of symptoms of depression were not suggestive of the presence of clinically significant depression at this time.    Mental Status:  Ms. Alanis total score on a very brief overall measure of mental status was not suggestive of the presence of significant cognitive disruption (MMSE-2 brief = 15/16).  She lost one point for failing to freely recall all of 3 words that she previously studied.  Subjectively, she denied noticing cognitive changes.     Impressions and Recommendations:  Ms. Wilcoxen neurocognitive screening was not suggestive of the presence of marked cognitive impairment.  From an emotional standpoint, she endorsed feeling frustrated and overwhelmed, but seems to be coping in a healthy way with those emotions, such that they are not significantly impacting her daily life, participation, or recovery.  In fact, her reaction seems like an appropriate adjustment reaction, given her situation.  Time was spent during today's session validating and normalizing her reactions.  She said that she felt as though it was helpful to talk to the neuropsychologist and she would be interested in continued support during her stay, as available.  I will plan to meet with her for follow-up, as schedules permit.      DIAGNOSIS:  Metastatic cancer to spine  Marlane Hatcher, Psy.D.  Clinical Neuropsychologist

## 2014-02-02 NOTE — Telephone Encounter (Signed)
Pt called regarding concerns with painful swallowing and wanting to know how long the pain would last and if there was anything she could take to help with the discomfort.  Spoke with Dr. [name].  Informed pt Dr. [name] would be writing a prescription for Carafate and that pain could last up to 2 weeks.  Pt appreciative.

## 2014-02-02 NOTE — Progress Notes (Signed)
Occupational Therapy Session Note  Patient Details  Name: Sarah [name] MRN: [id number] Date of Birth: 01-19-1966  Today's Date: 02/02/2014 OT Individual Time: 1100-1200 OT Individual Time Calculation (min): 60 min    Short Term Goals: Week 2:  OT Short Term Goal 1 (Week 2): Pt will rise from supine to sitting at edge of bed with mod assist to manage LE OT Short Term Goal 2 (Week 2): Pt will demo ability to complete slide board transfer from bed to w/c with mod A X1  OT Short Term Goal 3 (Week 2): Pt will demo ability to use leg lifter to manage legs with min assist during bed mobility OT Short Term Goal 4 (Week 2): Pt will demo ability to sustain supported sitting balance at raised mat while performing a functional task for 5 minutes with only supervision for safety.  Skilled Therapeutic Interventions/Progress Updates: ADL-retraining with focus on family ed and training on supine dressing skills, alternate transfer methods (anterior/posterior transfer demonstrated), and home modifications recommended.   Pt received in her [place] with sister and mother present.   OT initiated family education on bed mobility and assisted dressing with plan to complete lift transfer to bed to begin instruction.   However, pt complained of diarrhea and soreness at buttocks and rejected planned activity with her sister and mother reinforcing deference to task as being redundant d/t physical therapist having addressed bed mobility during previous treatment.   OT advised need for education on lower body dressing but this was again met with deference and profession of competence from sister who claimed prior vocation as Psychologist, counselling.   As pt requires only supervision assist for upper body B & D, family elected to review home mods, review of DME, and demonstration of slide board transfer.   OT re-educated pt and family on variety of lifts, slings, and related equipment and then demo'd slide board transfer with pt's  mother as proxy for pt.   OT then educated pt and family on anterior/posterior transfer which was well-received by pt and family as potential method for assisted transfers.   Session concluded with PWC mobility and brief discussion on community mobility methods and resources to be continued.   Pt returned to room in [facility], all needs within reach.           Therapy Documentation Precautions:  Precautions Precautions: Fall, Back Precaution Comments: bil leg weakness, paraplegia Restrictions Weight Bearing Restrictions: No  Pain: 5/10, buttocks d/t episode of diarrhea   ADL: ADL ADL Comments: see FIM  See FIM for current functional status  Therapy/Group: Individual Therapy   Second session: Time: 1300-1400 Time Calculation (min):  60 min  Pain Assessment: 5/10, throat, repositioned HOB to 32 deg.  Skilled Therapeutic Interventions:  Therapeutic activity with focus on posterior transfer method OOB to [place], seated grooming, UE strengthening using theraband.   Pt received supine in bed, complaining of mild discomfort at throat but willing to participate.   OT re-educated pt on anterior/posterior transfer methods and provided setup to attempt posterior transfer to w/c.   With max assist to reposition to head of bed, pt was setup with HOB elevated to 40 degrees to facilitate upright posture.    Pt was unable to maintain posture w/o support of bed and was unable to off-load her weight to any direction.   OT introduced push-up blocks to increase mechanical advantage of BUE but this had no effect.   Pt then began to complain of more severe throat  pain and requested return to supine to recover.   After brief rest with HOB reduced to 32 deg, pt recovered and was assisted to [facility] using Maxi-move, with mod assist for bed mobility.   From [place], pt sat upright to groom at sink, washing her hair and brushing her teeth with only setup assist to provide supplies.    OT then provided initial instruction off BUE HEP  using theraband.   Pt completed chest press, 1st of 7 exercises highlighted on written HEP at end of session.    Pt left in w/c with reacher, call light, and phone within reach.  See FIM for current functional status  Therapy/Group: Individual Therapy  [place] 02/02/2014, 12:24 PM

## 2014-02-02 NOTE — Progress Notes (Signed)
Physical Therapy Session Note  Patient Details  Name: Sarah [name] MRN: [id number] Date of Birth: [birth year]  Today's Date: 02/02/2014 PT Individual Time: 1000-1100 Treatment Session 2: 1430-1530 PT Individual Time Calculation (min): 60 min  Treatment Session 2: 60 min  Short Term Goals: Week 2:  PT Short Term Goal 1 (Week 2): Pt will scoot transfer bed to/from w/c with slideboard and 1 person assist PT Short Term Goal 2 (Week 2): Pt will propel w/c x 100' req SBA.  PT Short Term Goal 3 (Week 2): Pt will demonstrate ability to verbally instruct caregiver in dependent mechanical lift transfer.  PT Short Term Goal 4 (Week 2): Pt will tolerate standing frame x 1 minute.  PT Short Term Goal 5 (Week 2): Pt will demonstrate dynamic sit balance req SBA consistently.   Skilled Therapeutic Interventions/Progress Updates:    Therapeutic Activity: Pt's sister and mother present for family training during AM session, today. PT instructs family members and pt in using fully electric hospital bed, manual hoyer lift, and x-large hoyer sling in bed to power w/c transfer. PT explained and demonstrated how to use the different functions of each piece of equipment and sister and mother demonstrate understanding. Instructions include: 1. Locking/Unlocking hoyer brake 2. Opening/Closing legs of hoyer lift (always move pt with hoyer legs in wide position to reduce likelihood of lateral tipping) 3. Tightening/Loosening hydraulic knob 4. Pumping hoyer lift with handle 5. Moving hoyer lift with handle bars 6. Turning metal loop holders of hoyer lift for proper positioning (ensure the bars of the loop support run vertical to pt's body, not horizontal to avoid pressure on sternum/neck) 7. Raising hospital bed up/down to accommodate hoyer lift height and caregiver's height 8. Crossing pt's leg at the ankles to assist pt in rolling and then providing min A to push/pull pt in roll at hip.  9. Using draw sheet to  assist pt in scooting laterally in supine to prepare for roll.  10. Instructing pt to use bedrails to assist in rolling R and L. 11. Placing sling via roll and tuck method under pt, while she rolls and proper positioning to ensure good support when up in sling and best placement for sitting in w/c. 12. Positioning pt so that legs straddle vertical support of hoyer prior to sitting her down in w/c (ensure pt's leg does not turn hydraulic knob off, which will lower the hoyer lift) 13. 2 person assist in pulling back on pt's sling from behind and pushing on pt's knees from in front to get pt's hips back in power w/c. 14. Use of power button, tilt function, decreasing speed button to slowest setting, and joystick to position power w/c for safe transfer (specifically for chair to be OFF when transferring pt.  15. Placement of foley bag to ensure it doesn't pull on pt during transfer.  16. Placement of pt's feet on w/c footplates to ensure appropriate ground reaction support when pt is placed up in w/c.  17. Ensuring the loops on the hoyer sling match side to side and criss-crossing the legs of the sling under the pt for stability.  18. Ensuring the legs of the hoyer sling do not drag on the floor and instructing family in use of the seatbelt for pt safety, as well as placement of foley on power w/c behind footplates to ensure it won't drag.   Family demonstrated excellent understanding of hoyer lift transfer, but will benefit from an additional training session to ensure good memory  of all safety precautions with mechanical lift transfer and to answer any questions that family may have. Cont per PT POC.   Treatment Session 2: W/C Management: Pt demonstrates mod I w/c propulsion in power w/c 200' x 2 reps with joystick, mod I tilt function for pt to scoot bottom back in chair by completing scapular depression in maximally tilted position x 10 reps - L hip moves back slightly. Pt req 2 tugs by PT to get hips  centered in power w/c after standing frame activity.  PT instructs pt in scooting bottom back in power w/c after standing frame activity by leaning trunk forward and pushing with B UEs to scoot hips posteriorly without tilt function x 5 reps.   Therapeutic Activity: PT and CNA complete 2 person dependent maxi lift transfer w/c to bed. Pt rolls R and L in bed req min A to remove sling, doff pants and brief, and clean pt's posterior area (small BM smear noted). Pt req tot A to doff pants and brief and complete hygiene.   Neuromuscular Reeducation: PT instructs pt on standing frame x 4.5 minutes total - pt plays 5 games of tic-tac-toe on the mirror while in standing req occasional B UE support on tray, occasional U UE support on tray for neuromuscular reeducation to B LEs in weightbearing position.   Pt is progressing in familiarity and comfort with power w/c maneuvering. Pt is also tolerating longer stands in frame. Continue per PT POC.   Therapy Documentation Precautions:  Precautions Precautions: Fall, Back Precaution Comments: bil leg weakness, paraplegia Restrictions Weight Bearing Restrictions: No  Pain: Pain Assessment Pain Assessment: 0-10 Pain Score: 5  Pain Type: Acute pain Pain Location: Buttocks Pain Orientation: Mid Pain Descriptors / Indicators: Guarding Pain Onset: Progressive Pain Intervention(s): Repositioned;Distraction Multiple Pain Sites: No  Treatment Session 2: Pt c/o 2/10 pain in anterior throat - PT utilizes ice chips to soothe pt's throat.    See FIM for current functional status  Therapy/Group: Individual Therapy with Olivia Mackie, PT Tech as +2  [facility] M 02/02/2014, 12:52 PM

## 2014-02-02 NOTE — Progress Notes (Signed)
49 y.o. female with history of HTN, chronic back pain, morbid obesity who was admitted on 12/10/13 with BLE weakness, inability to walk as well as problems with urinary retention and constipation. Work up revealed a lesion at the T3 level of the upper thoracic spine with pathologic compression fracture and epidural tumor resulting in moderate cord compression with evidence of mild spinal cord edema, as well as right suprahilar mass with pulmonary nodules with mediastinal lymph nodes c/w metastatic disease, RUL & RLL PE, tubular cystic masses bilateral adnexal regions and a massive thyroid goiter causing tracheal deviation to the right with narrowing of trachea. She was started on steriods and foley placed for urinary retention. She underwent thoracic laminectomy with excision of tumor on 09/23 by Dr. Christella Noa and on bedrest pending further surgery. Pathology favoring metastatic adenocarcinoma of lung but metastatic thyroid cancer in differential . 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 Buddy Duty consulted for input. 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 Patient underwent T1-T5 posterolateral arthrodesis with allograft on 10/09 with plans XRT to be initiated 2 weeks post op. Dr. Constance Holster consulted for input and patient underwent total thyroidectomy on 10/19 and extubated without difficulty on 10/20.  Subjective/Complaints: Feeling a little better. Loose stool resolving.   Vital Signs: Blood pressure 142/64, pulse 106, temperature 97.9 F (36.6 C), temperature source Oral, resp. rate 18, height 5' 11"  (1.803 m), weight 142.7 kg (314 lb 9.5 oz), last menstrual period 12/06/2013, SpO2 97 %. No results found. Results for orders placed or performed during the hospital encounter of 01/19/14 (from the past 72 hour(s))  Glucose, capillary     Status: Abnormal   Collection Time: 01/30/14  7:37 AM  Result Value  Ref Range   Glucose-Capillary 124 (H) 70 - 99 mg/dL   Comment 1 Notify RN   Glucose, capillary     Status: Abnormal   Collection Time: 01/30/14 12:02 PM  Result Value Ref Range   Glucose-Capillary 139 (H) 70 - 99 mg/dL   Comment 1 Notify RN   Glucose, capillary     Status: Abnormal   Collection Time: 01/30/14  5:01 PM  Result Value Ref Range   Glucose-Capillary 123 (H) 70 - 99 mg/dL   Comment 1 Notify RN   Glucose, capillary     Status: Abnormal   Collection Time: 01/30/14  9:10 PM  Result Value Ref Range   Glucose-Capillary 132 (H) 70 - 99 mg/dL  Glucose, capillary     Status: Abnormal   Collection Time: 01/31/14  6:46 AM  Result Value Ref Range   Glucose-Capillary 137 (H) 70 - 99 mg/dL  Glucose, capillary     Status: Abnormal   Collection Time: 01/31/14 11:55 AM  Result Value Ref Range   Glucose-Capillary 143 (H) 70 - 99 mg/dL   Comment 1 Notify RN   Glucose, capillary     Status: Abnormal   Collection Time: 01/31/14  4:48 PM  Result Value Ref Range   Glucose-Capillary 161 (H) 70 - 99 mg/dL  Glucose, capillary     Status: Abnormal   Collection Time: 01/31/14  8:39 PM  Result Value Ref Range   Glucose-Capillary 137 (H) 70 - 99 mg/dL   Comment 1 Notify RN   Glucose, capillary     Status: Abnormal   Collection Time: 02/01/14  6:19 AM  Result Value Ref Range   Glucose-Capillary 116 (H) 70 - 99 mg/dL  Comment 1 Notify RN   Glucose, capillary     Status: Abnormal   Collection Time: 02/01/14 11:54 AM  Result Value Ref Range   Glucose-Capillary 139 (H) 70 - 99 mg/dL   Comment 1 Notify RN   Glucose, capillary     Status: Abnormal   Collection Time: 02/01/14  4:39 PM  Result Value Ref Range   Glucose-Capillary 144 (H) 70 - 99 mg/dL   Comment 1 Notify RN   Basic metabolic panel     Status: Abnormal   Collection Time: 02/01/14  6:45 PM  Result Value Ref Range   Sodium 136 (L) 137 - 147 mEq/L   Potassium 3.7 3.7 - 5.3 mEq/L   Chloride 98 96 - 112 mEq/L   CO2 27 19 - 32  mEq/L   Glucose, Bld 109 (H) 70 - 99 mg/dL   BUN 8 6 - 23 mg/dL   Creatinine, Ser 0.66 0.50 - 1.10 mg/dL   Calcium 8.4 8.4 - 10.5 mg/dL   GFR calc non Af Amer >90 >90 mL/min   GFR calc Af Amer >90 >90 mL/min    Comment: (NOTE) The eGFR has been calculated using the CKD EPI equation. This calculation has not been validated in all clinical situations. eGFR's persistently <90 mL/min signify possible Chronic Kidney Disease.    Anion gap 11 5 - 15  CBC     Status: Abnormal   Collection Time: 02/01/14  6:45 PM  Result Value Ref Range   WBC 5.3 4.0 - 10.5 K/uL   RBC 3.28 (L) 3.87 - 5.11 MIL/uL   Hemoglobin 9.3 (L) 12.0 - 15.0 g/dL   HCT 28.4 (L) 36.0 - 46.0 %   MCV 86.6 78.0 - 100.0 fL   MCH 28.4 26.0 - 34.0 pg   MCHC 32.7 30.0 - 36.0 g/dL   RDW 15.2 11.5 - 15.5 %   Platelets 217 150 - 400 K/uL  Glucose, capillary     Status: Abnormal   Collection Time: 02/01/14  8:51 PM  Result Value Ref Range   Glucose-Capillary 103 (H) 70 - 99 mg/dL   Comment 1 Notify RN   Glucose, capillary     Status: Abnormal   Collection Time: 02/02/14  6:18 AM  Result Value Ref Range   Glucose-Capillary 123 (H) 70 - 99 mg/dL   Comment 1 Notify RN       Nursing note and vitals reviewed.  Constitutional: She is oriented to person, place, and time. She appears well-developed and well-nourished.  HENT:  Head: Normocephalic and atraumatic.  Eyes: Conjunctivae are normal. Pupils are equal, round, and reactive to light.  Neck: Normal range of motion.  Cardiovascular: Normal rate and regular rhythm.  No murmur heard.  Respiratory: Effort normal and breath sounds normal. No respiratory distress. She has no wheezes. She exhibits no tenderness (sternal area with radiation markings.)  GI: Soft. Bowel sounds are normal. She exhibits no distension. There is no tenderness.  Musculoskeletal: She exhibits no edema and no tenderness.  Neurological: She is alert and oriented to person, place, and time.   Dense  paraparesis with flexor spasms and 3 beats clonus on the right.  Diminished sensation 1/2 below lower/mid chest wall.   Skin: Skin is warm and dry.  Intergluteal and sacrum still a little irritated. Psychiatric: She has a normal mood and affect. Her behavior is normal. Judgment and thought content normal.  Motor strength is 5/5 bilateral deltoids, biceps, triceps, grip  0/5 bilateral hip flexors knee  extensors ankle dorsiflexor plantar flexor Tone 2/4 left greater than right LE's and fluctuating   Assessment/Plan: 1. Functional deficits secondary to Paraplegia which require 3+ hours per day of interdisciplinary therapy in a comprehensive inpatient rehab setting. Physiatrist is providing close team supervision and 24 hour management of active medical problems listed below. Physiatrist and rehab team continue to assess barriers to discharge/monitor patient progress toward functional and medical goals.    FIM: FIM - Bathing Bathing Steps Patient Completed: Chest, Right Arm, Left Arm, Abdomen Bathing: 2: Max-Patient completes 3-4 [id number] parts or 25-49%  FIM - Upper Body Dressing/Undressing Upper body dressing/undressing steps patient completed: Thread/unthread right sleeve of pullover shirt/dresss, Thread/unthread left sleeve of pullover shirt/dress, Put head through opening of pull over shirt/dress Upper body dressing/undressing: 0: Wears gown/pajamas-no public clothing FIM - Lower Body Dressing/Undressing Lower body dressing/undressing: 0: Wears gInterior and spatial designer FIM - Toileting Toileting: 1: Total-Patient completed zero steps, helper did all 3  FIM - TAir cabin crewTransfers: 0-Activity did not occur  FIM - BControl and instrumentation engineerDevices: Bed rails, Arm rests, Sliding board Bed/Chair Transfer: 3: Sit > Supine: Mod A (lifting assist/Pt. 50-74%/lift 2 legs), 3: Supine > Sit: Mod A (lifting assist/Pt. 50-74%/lift 2 legs, 1: Bed > Chair or  W/C: Total A (helper does all/Pt. < 25%), 1: Chair or W/C > Bed: Total A (helper does all/Pt. < 25%), 1: Two helpers  FIM - Locomotion: Wheelchair Distance: 200 Locomotion: Wheelchair: 6: Travels 150 ft or more, turns around, maneuvers to table, bed or toilet, negotiates 3% grade: maneuvers on rugs and over door sills independently FIM - Locomotion: Ambulation Locomotion: Ambulation: 0: Activity did not occur  Comprehension Comprehension Mode: Auditory Comprehension: 7-Follows complex conversation/direction: With no assist  Expression Expression Mode: Verbal Expression: 7-Expresses complex ideas: With no assist  Social Interaction Social Interaction: 5-Interacts appropriately 90% of the time - Needs monitoring or encouragement for participation or interaction.  Problem Solving Problem Solving: 7-Solves complex problems: Recognizes & self-corrects  Memory Memory: 7-Complete Independence: No helper  Medical Problem List and Plan:  1. 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   -xrt completed 2. DVT Prophylaxis/Anticoagulation: Pharmaceutical: Xarelto per Hem/Onc input.  3. Pain Management: increase baclofen to better manage spasticity 4. Mood:prn ativan for anxiety,       -pt aware of situational anxiety with therapy--some improvement? 5. Neuropsych: This patient is capable of making decisions on her own behalf.  6. Skin/Wound Care: Air mattress overlay for prevention of break down. Start educating patient and family on need for pressure relief measures.  7. Fluids/Electrolytes/Nutrition: encourage po intake..  .  8. Neurogenic bladder: foley 9. Neurogenic bowel/GI: glycerin suppository at HS  - getting closer to HS schedule  -reduce senokot s to help with stool consistency---2 qam only now  -loose stool better. 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 For  RAI at 6-8 weeks post op.  11. HTN: Will monitor every 8 hours. No medications as this time. Monitor for orthostatic changes once OOB.  12 PCOS? With hyperglycemia: Hgb A1c-6.7.   -sugars reasonable 13. E coli UTI--keflex complete 14. Hemorrhoids: continue proctofoam cream bid in addition to good rectal hygiene to prevent irritation from stool.  15. Spasticity: baclofen trial 123mTID  LOS (Days) 14 A FACE TO FACE EVALUATION WAS PERFORMED  Denilson Salminen T 02/02/2014, 7:33 AM

## 2014-02-02 NOTE — Plan of Care (Signed)
Problem: SCI BOWEL ELIMINATION Goal: RH STG MANAGE BOWEL WITH ASSISTANCE STG Manage Bowel with max Assistance.  Outcome: Progressing Goal: RH STG SCI MANAGE BOWEL WITH MEDICATION WITH ASSISTANCE STG SCI Manage bowel with medication with minimal assistance.  Outcome: Progressing Goal: RH STG MANAGE BOWEL W/EQUIPMENT W/ASSISTANCE STG Manage Bowel With Equipment With moderate Assistance  Outcome: Progressing Goal: RH STG SCI MANAGE BOWEL PROGRAM W/ASSIST OR AS APPROPRIATE STG SCI Manage bowel program w/ max assist or as appropriate.  Outcome: Progressing  Problem: SCI BLADDER ELIMINATION Goal: RH STG MANAGE BLADDER WITH ASSISTANCE STG Manage Bladder With total Assistance (foley in place)  Outcome: Progressing Goal: RH STG SCI MANAGE BLADDER PROGRAM W/ASSISTANCE Outcome: Progressing  Problem: RH SKIN INTEGRITY Goal: RH STG SKIN FREE OF INFECTION/BREAKDOWN Skin free of infection/breakdown with moderatel assistance  Outcome: Progressing Goal: RH STG MAINTAIN SKIN INTEGRITY WITH ASSISTANCE STG Maintain Skin Integrity With moderate Assistance.  Outcome: Progressing Goal: RH STG ABLE TO PERFORM INCISION/WOUND CARE W/ASSISTANCE STG Able To Perform Incision/Wound Care With total Assistance of back incision.  Outcome: Progressing  Problem: RH SAFETY Goal: RH STG ADHERE TO SAFETY PRECAUTIONS W/ASSISTANCE/DEVICE STG Adhere to Safety Precautions With minimal Assistance/Device.  Outcome: Progressing Goal: RH STG DEMO UNDERSTANDING HOME SAFETY PRECAUTIONS Outcome: Progressing  Problem: RH PAIN MANAGEMENT Goal: RH STG PAIN MANAGED AT OR BELOW PT'S PAIN GOAL Pain less than 3  Outcome: Progressing

## 2014-02-02 NOTE — Progress Notes (Signed)
NUTRITION FOLLOW UP  DOCUMENTATION CODES Per approved criteria  -Morbid Obesity   INTERVENTION: Continue Ensure Complete po once daily, each supplement provides 350 kcal and 13 grams of protein.  Provide Magic cup BID between meals, each supplement provides 290 kcal and 9 grams of protein.  Encourage PO intake.  NUTRITION DIAGNOSIS: Inadequate oral intake related to decreased appetite as evidenced by meal completion of 50-85%; Progressing  Goal: Pt to meet >/= 90% of their estimated nutrition needs; unmet  Monitor:  PO intake, weight trends, labs, I/O's  48 y.o. 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 Work up revealed a lesion at the T3 level of the upper thoracic spine with pathologic compression fracture and epidural tumor resulting in moderate cord compression with evidence of mild spinal cord edema, as well as right suprahilar mass with pulmonary nodules with mediastinal lymph nodes c/w metastatic disease.  Meal completion is 0-100%. Pt reports her appetite is decreased, which started 2 days ago. Pt reports she has been having difficulties swallowing and it has been difficult to eat her food and drink her drinks at meals. Pt is agreeable to magic cup as it has a pudding thick consistency to help aid in swallowing. Will order. Pt reports she has not tried Ensure yet. Will continue with orders. Pt was encouraged to eat her food at meals.  Height: Ht Readings from Last 1 Encounters:  01/19/14 5\' 11"  (1.803 m)    Weight: Wt Readings from Last 1 Encounters:  02/01/14 314 lb 9.5 oz (142.7 kg)    BMI:  Body mass index is 43.9 kg/(m^2). Morbid obesity  Re-Estimated Nutritional Needs: Kcal: 2100-2400 Protein: 135-150 grams Fluid: 2.1 - 2.4  L/day  Skin: incision on back and neck, +1 LE edema  Diet Order: Diet Carb Modified    Intake/Output Summary (Last 24 hours) at 02/02/14 1639 Last data filed at 02/02/14 1532  Gross per 24 hour  Intake    852 ml  Output   2050 ml  Net  -1198 ml    Last BM: 11/12  Labs:   Recent Labs Lab 02/01/14 1845  NA 136*  K 3.7  CL 98  CO2 27  BUN 8  CREATININE 0.66  CALCIUM 8.4  GLUCOSE 109*    CBG (last 3)   Recent Labs  02/02/14 0618 02/02/14 1155 02/02/14 1632  GLUCAP 123* 122* 118*    Scheduled Meds: . baclofen  10 mg Oral TID  . famotidine  10 mg Oral BID  . feeding supplement (ENSURE COMPLETE)  237 mL Oral Q1500  . Glycerin (Adult)  1 suppository Rectal QHS  . hydrocortisone   Rectal BID  . insulin aspart  0-15 Units Subcutaneous TID WC  . insulin aspart  0-5 Units Subcutaneous QHS  . levothyroxine  250 mcg Oral QAC breakfast  . ondansetron  8 mg Oral Once  . Rivaroxaban  15 mg Oral BID WC  . [START ON 02/10/2014] rivaroxaban  20 mg Oral Daily  . senna-docusate  2 tablet Oral Q0600  . sucralfate  1 g Oral TID WC & HS    Continuous Infusions:   Past Medical History  Diagnosis Date  . Hypertension   . DDD (degenerative disc disease), cervical   . DJD (degenerative joint disease)   .  Obesity   . Chronic back pain   . 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 History of rectal fissure   . Rotator cuff tendonitis right    Past Surgical History  Procedure Laterality Date  . Tonsillectomy    . Laminectomy N/A 12/14/2013    Procedure: THORACIC LAMINECTOMY FOR TUMOR THORACIC THREE;  Surgeon: Ashok Pall, MD;  Location: [facility];  Service: Neurosurgery;  Laterality: N/A;  THORACIC LAMINECTOMY FOR TUMOR THORACIC THREE  . Posterior lumbar fusion 4 level N/A 12/30/2013    Procedure: Thoracic one-Thoracic five posterior thoracic fusion with pedicle screws;  Surgeon: Ashok Pall, MD;  Location: [facility];  Service:  Neurosurgery;  Laterality: N/A;  Thoracic one-Thoracic five posterior thoracic fusion with pedicle screws  . Thyroidectomy N/A 01/09/2014    Procedure: TOTAL THYROIDECTOMY;  Surgeon: Izora Gala, MD;  Location: [facility];  Service: ENT;  Laterality: N/A;    Kallie Locks, MS, RD, LDN Pager # [phone] After hours/ weekend pager # [phone]

## 2014-02-03 ENCOUNTER — Inpatient Hospital Stay (HOSPITAL_COMMUNITY): Payer: Medicaid Other

## 2014-02-03 ENCOUNTER — Inpatient Hospital Stay (HOSPITAL_COMMUNITY): Payer: Medicaid Other | Admitting: Physical Therapy

## 2014-02-03 ENCOUNTER — Other Ambulatory Visit: Payer: Self-pay | Admitting: Radiation Oncology

## 2014-02-03 DIAGNOSIS — R1314 Dysphagia, pharyngoesophageal phase: Secondary | ICD-10-CM

## 2014-02-03 DIAGNOSIS — C7951 Secondary malignant neoplasm of bone: Secondary | ICD-10-CM

## 2014-02-03 LAB — GLUCOSE, CAPILLARY
GLUCOSE-CAPILLARY: 120 mg/dL — AB (ref 70–99)
Glucose-Capillary: 113 mg/dL — ABNORMAL HIGH (ref 70–99)
Glucose-Capillary: 137 mg/dL — ABNORMAL HIGH (ref 70–99)
Glucose-Capillary: 113 mg/dL — ABNORMAL HIGH (ref 70–99)

## 2014-02-03 MED ORDER — PROMETHAZINE HCL 12.5 MG RE SUPP
12.5000 mg | Freq: Once | RECTAL | Status: AC
  Administered 2014-02-03: 12.5 mg via RECTAL
  Filled 2014-02-03: qty 1

## 2014-02-03 MED ORDER — SUCRALFATE 1 G PO TABS: 1.0000 g | ORAL_TABLET | Freq: Three times a day (TID) | ORAL | Status: DC

## 2014-02-03 NOTE — Plan of Care (Signed)
Problem: SCI BLADDER ELIMINATION Goal: RH STG MANAGE BLADDER WITH ASSISTANCE STG Manage Bladder With total Assistance (foley in place)  Outcome: Progressing Goal: RH STG SCI MANAGE BLADDER PROGRAM W/ASSISTANCE Outcome: Progressing  Problem: RH SKIN INTEGRITY Goal: RH STG SKIN FREE OF INFECTION/BREAKDOWN Skin free of infection/breakdown with moderatel assistance  Outcome: Progressing Goal: RH STG MAINTAIN SKIN INTEGRITY WITH ASSISTANCE STG Maintain Skin Integrity With moderate Assistance.  Outcome: Progressing Goal: RH STG ABLE TO PERFORM INCISION/WOUND CARE W/ASSISTANCE STG Able To Perform Incision/Wound Care With total Assistance of back incision.  Outcome: Progressing  Problem: RH SAFETY Goal: RH STG ADHERE TO SAFETY PRECAUTIONS W/ASSISTANCE/DEVICE STG Adhere to Safety Precautions With minimal Assistance/Device.  Outcome: Progressing Goal: RH STG DEMO UNDERSTANDING HOME SAFETY PRECAUTIONS Outcome: Progressing  Problem: RH PAIN MANAGEMENT Goal: RH STG PAIN MANAGED AT OR BELOW PT'S PAIN GOAL Pain less than 3  Outcome: Progressing

## 2014-02-03 NOTE — Progress Notes (Signed)
Occupational Therapy Weekly Progress Note  Patient Details  Name: Sarah [name] MRN: [id number] Date of Birth: 12-30-1965  Beginning of progress report period: January 27, 2014 End of progress report period: February 03, 2014  Today's Date: 02/03/2014 OT Individual Time: 1100-1200 OT Individual Time Calculation (min): 60 min    Patient has met 1 of 4 short term goals.  Pt is progressing to achieve 2 additional goals since radiation treatments have ended on 01/31/14.   4th goal has been discontinued as pt is able perform bed mobility with min assist w/o need for leg lifter.  Patient continues to demonstrate the following deficits: Impaired endurance, muscle weakness (BUE), impaired transfer skills, inadequate competence with use of AE, impaired sitting balance, inadequate competence with role of directing caregivers during assisted self-care and therefore will continue to benefit from skilled OT intervention to enhance overall performance with Reduce care partner burden.  Patient progressing toward long term goals..  Continue plan of care.  OT Short Term Goals Week 2:  OT Short Term Goal 1 (Week 2): Pt will rise from supine to sitting at edge of bed with mod assist to manage LE OT Short Term Goal 1 - Progress (Week 2): Met OT Short Term Goal 2 (Week 2): Pt will demo ability to complete slide board transfer from bed to w/c with mod A X1  OT Short Term Goal 2 - Progress (Week 2): Progressing toward goal OT Short Term Goal 3 (Week 2): Pt will demo ability to use leg lifter to manage legs with min assist during bed mobility OT Short Term Goal 3 - Progress (Week 2): Discontinued (comment) (Leg lifter not required) OT Short Term Goal 4 (Week 2): Pt will demo ability to sustain supported sitting balance at raised mat while performing a functional task for 5 minutes with only supervision for safety. OT Short Term Goal 4 - Progress (Week 2): Progressing toward goal Week 3:  OT Short Term Goal 1  (Week 3): Pt will demo ability to complete slide board transfer from bed to w/c with mod A X1  OT Short Term Goal 2 (Week 3): Pt will demo ability to sustain supported sitting balance at raised mat while performing a functional task for 5 minutes with only supervision for safety. OT Short Term Goal 3 (Week 3): Pt will demo ability to complete 10 min of BUE arm ergometry at Sci-Fit, level 1, with supervision and setup, to improve general endurance OT Short Term Goal 4 (Week 3): Pt will demo ability to complete BUE HEP (7 exercises) using thera-band with setup assist. OT Short Term Goal 5 (Week 3): Pt will demo ability to access refrigerator from [facility] or WC to retrieve beverage using appropriate AE.  Skilled Therapeutic Interventions/Progress Updates: ADL-retraining with emphasis on seated grooming (brushing teeth and hair care) and slide board transfer from [facility] to bed, static/dynamic sitting balance, pain management and bed mobility.   Pt received seated in [place] after session with physical therapist.   Pt advised on goals and methods of treatment planned with clarification required to facilitate improved effort from pt.   Pt advised that although [facility] lift transfers are manageable, slide board transfer training is recommended as alternate when/if equipment fails and slide board also provides added benefit of incorporating sitting balance, weight-shifting, and improved independence from need for larger equipment, like Hoyer lift.    Pt first completed grooming at sink with only setup assist and then propelled PWC to proper position along bedside with min  vc.   Pt completed slide board transfer with max A X 1 although additional helper provided standby assist for safety d/t pt's fear of instability during transfer.   Pt recovered at edge of bed and sustained supported sitting balance for 7 minutes this session prior to recovering to supine with mod assist to manage LE.   Pt reported mild discomfort at right lower  trunk and left shoulder (lateral border of scapula) during supported sitting and was introduced to use of massage wand for pain relief with partial success.  Pt requires extra time and frequent rest breaks during session d/t fatigue and discomfort at throat and back but will sustain effort with moderate encouragement.     Therapy Documentation Precautions:  Precautions Precautions: Fall, Back Precaution Comments: bil leg weakness, paraplegia Restrictions Weight Bearing Restrictions: No  Pain: Pain Assessment Pain Assessment: 0-10 Pain Score: 4  Pain Type: Chronic pain Pain Location: Back Pain Orientation: Lower Pain Descriptors / Indicators: Aching Pain Frequency: Intermittent Pain Onset: Gradual Patients Stated Pain Goal: 3 Pain Intervention(s): Medication (See eMAR);Repositioned Multiple Pain Sites: No  ADL: ADL ADL Comments: see FIM  See FIM for current functional status  Therapy/Group: Individual Therapy   Second session: Time: 1300-1400 Time Calculation (min):  60 min  Pain Assessment: No/denies pain  Skilled Therapeutic Interventions: ADL-retraining (30 min) with focus on bed mobility, pt assuming role of directing caregivers, and slide board transfer.   With min-mod vc for sequence and just min vc to embrace role of director of her care, pt director caregivers with bed mobility (min assist), gathering supplies (supervision), and performing max assist slide board transfer from edge of bed to [place].   Slide board transfer was improved during this session with use of bed chuck (pad) to reduce resistance across board during slide maneuver.   Pt's mother was in room briefly at beginning of session.   OT provided written review of "bullet points" covered during family training session and HEP for PROM of BLE.   Therapeutic exercise (30 min) with focus on endurance (arm ergometry using Sci-Fit) and continued BUE HEP instruction.   After setup for access with PWC at Sci-Fit, pt  performed 5 min of ergometry, level 1, although with difficulty to maintain 30 RPM as directed.   Pt demo'd poor tolerance of task initially but sustained effort as directed for 5 min.       See FIM for current functional status  Therapy/Group: Individual Therapy  [place] 02/03/2014, 2:20 PM

## 2014-02-03 NOTE — Progress Notes (Signed)
Physical Therapy Weekly Progress Note  Patient Details  Name: Sarah [name] MRN: [id number] Date of Birth: 06-27-1965  Beginning of progress report period: January 28, 2014 End of progress report period: February 03, 2014  Today's Date: 02/03/2014 PT Individual Time: 1000-1100 Treatment Session 2: 1435-1535 PT Individual Time Calculation (min): 60 min  Treatment Session 2: 60 min  Patient has met 3 of 5 short term goals.    Patient continues to demonstrate the following deficits: paraplegia, difficulty with bed mobility and transfers, inability to ambulate, generalized low activity tolerance and therefore will continue to benefit from skilled PT intervention to enhance overall performance with activity tolerance, balance, postural control, ability to compensate for deficits and coordination.  Patient progressing toward long term goals..  Continue plan of care.  PT Short Term Goals Week 2:  PT Short Term Goal 1 (Week 2): Pt will scoot transfer bed to/from w/c with slideboard and 1 person assist PT Short Term Goal 1 - Progress (Week 2): Met PT Short Term Goal 2 (Week 2): Pt will propel w/c x 100' req SBA.  PT Short Term Goal 2 - Progress (Week 2): Met PT Short Term Goal 3 (Week 2): Pt will demonstrate ability to verbally instruct caregiver in dependent mechanical lift transfer.  PT Short Term Goal 3 - Progress (Week 2): Progressing toward goal PT Short Term Goal 4 (Week 2): Pt will tolerate standing frame x 1 minute.  PT Short Term Goal 4 - Progress (Week 2): Met PT Short Term Goal 5 (Week 2): Pt will demonstrate dynamic sit balance req SBA consistently.  PT Short Term Goal 5 - Progress (Week 2): Progressing toward goal Week 3:  PT Short Term Goal 1 (Week 3): STGs = LTGs due to ELOS  Skilled Therapeutic Interventions/Progress Updates:    Therapeutic Activity: PT instructs pt in rolling R and L multiple times in bed with bedrail (assist at the hips and legs) to don brief and pants,  remove dirty t-shirt, and don clean t-shirt.  PT instructs pt in L side lie to sit in bed with rail req min A.  PT instructs pt in scoot transfer with slideboard req 1 person assist: min-mod A, downhill from bed to w/c to the pt's R.   Therapeutic Exercise: Pt reports back feels very stiff and so PT instructs pt in hinging forward at the hips while sitting edge of bed req min A for balance and then "arching" (sitting up back towards neutral) x 3 reps for 30 seconds each direction. Pt reports this decreases her stiffness.   W/C Management: Pt demonstrates mod I w/c propulsion in power w/c in a controlled environment and in a community environment, including getting on/off the elevator, navigating through automatic doors, on different floor surfaces (carpet, tile, brick, concrete), and going up/down a ramp of ~7% grade.   Pt demonstrated 1 person slideboard assist for the first time, today. Pt's fear of falling is still present, but much reduced over the past week. Pt will benefit from continued overall strengthening to maximize functional independence. Cont per Pt POC.   Treatment Session 2: Pt c/o nausea when PT enters for PM treatment and PT agrees to help pt transfer back to bed via mechanical lift.   Therapeutic Activity: PT instructs pt to verbally instruct PT and PT Tech in how to do mechanical lift transfer from power w/c to bed. Pt is able to direct 50% of electrical lift transfer with Maxi Lift, req +2 assist for safety.  PT instructs pt in rolling R and L in bed with bedrails req min A to remove sling, tot A to doff pants and complete a diaper change, including posterior perineal hygiene due to incontinence of bowel.  Pt req +2 assist, but participates, in scooting body up in supine with bedrails.   W/C Management: W/C vendor Cindee Salt from [employer] present for part of this session to confirm required power w/c components needed for pt at discharge:  1. Power wheelchair with mid-wheel  drive (M841 HD): Due to patient's T4 incomplete paraplegia, low activity tolerance, low bilateral upper extremity muscular endurance, fatigue from radiation treatment for cancer, and sheer weight from morbid obesity, this patient is unable to self propel a standard through ultra-lightweight manual wheelchair household distances and requires the use of a power wheelchair. Mid-wheel drive is required due to the patient's need for a tight turning radius in order to maneuver effectively within her home environment in order to position self for activities of daily living. This patient is unable to walk with any assistive device (cane, walker, etc) due to her paraplegia.   2. Corpus 3G Ergo backrest: Due to patient's paraplegia resulting in impaired static and dynamic sitting balance, she requires the use of this backrest for appropriate posterior trunk positioning and support for postural asymmitries and safety. Without back support (while sitting edge of bed), patient demonstrates slouched and kyphotic posture and is unable to achieve or maintain appropriate lordotic curve in the lumbar spine without an appropriate positioning backrest.   3. Corpus 3G Ergo seat cushion (21" wide by 20 inches deep): Due to patient's paraplegia, she requires a seat cushion for maintaining appropriate pelvic positioning and support. [name] unable to adjust her pelvis on her own due to her morbid obesity, paraplegia, low activity tolerance, and low muscular endurance of bilateral upper extremities and requires the use of a positioning cushion to assist in maintaining appropriate positioning.    4. Flip-back and Adjustable Height Armrests: Due to patient's paraplegia, she requires adjustable height armrests for proper positioning of elbows at a 90 degree angle and also for appropriate support of the shoulder in a sitting position. Pt also has impaired static and dynamic sitting balance and will benefit from adjustable height  armrests to promote trunk stability and safety in a sitting position. Pt will benefit from flipback armrests for safety during scoot transfers with a slideboard and ease of donning/doffing a lift sling for dependent lift transfers.   5. Knee adductor guides: Due to patient's paraplegia, she is unable to adduct bilateral hips and these guides will promote safe seating stability and positioning.   6. Power Tilt function: Patient requires power tilt function for adequate pressure reliefs and assist in postioning hips all the way back in wheelchair after being lift transferred into wheelchair. Patient's upper extremity weakness and low endurance, as well as morbid obesity, prevent her from completing self postioning once seated in the wheelchair. [name] unable to perform a bottom lift with bilateral upper extremities due to morbid obesity and bilateral upper extremity weakness and low muscular endurance. [name] unable to adequately pressure relieve with forward and lateral leans due to fear of falling, impaired dynamic sitting balance, and morbid obesity.   7. Power Recline function: Patient requires power recline function for adequate pressure reliefs, improved circulation for edema control in bilateral lower extremities (patient has pitting edema > 30 seconds), and preparation for proper positioning for intermittent catheterization and proper positioning for maintenance of changing indwelling  catheter.   8. Power Elevating Legrests: Patient has significant bilateral ankle edema (>30 seconds pitting edema) and will benefit from electric elevating legrests for edema management due to pt will be home alone for 2-4 hours at a time and will need the independent ability to elevate and lower legrests using electric power. Non-power elevating legrests are not appropriate due to pt's impaired leaning forward ROM and B LE paraplegia that make it impossible for pt to self elevate and lower legrests.   9. Head  Rest: Patient requires a head rest to support her neck when power wheelchair is in tilted position for pressure reliefs or positioning hips all the way back in wheelchair and centered. [name] unable to support neck in fully tilted position due to weakness and low muscular endurance of anterior cervical neck flexors.   Pt's PM session participation limited by nausea. Continue per PT POC.   Therapy Documentation Precautions:  Precautions Precautions: Fall, Back Precaution Comments: bil leg weakness, paraplegia Restrictions Weight Bearing Restrictions: No  Pain: Pain Assessment Pain Assessment: 0-10 Pain Score: 3  Pain Type: Chronic pain Pain Location: Back Pain Orientation: Lower Pain Descriptors / Indicators: Aching Pain Onset: Gradual Pain Intervention(s): Repositioned Multiple Pain Sites: No  Treatment Session 2: Pt denies pain, but c/o nausea. RN notified and gave pt medication.   See FIM for current functional status  Therapy/Group: Individual Therapy with Olivia Mackie, PT Tech for AM and PM sessions  [facility] M 02/03/2014, 10:09 AM

## 2014-02-03 NOTE — Progress Notes (Signed)
Patient with complaint of nausea off and off today. Patient given Zofran at 1514, Pepcid given this evening at 1709. Pt with small amount of emesis at 1730, cram colored, no pills noted in emesis. Pt given coke to sip on to settle stomach per her request, declined Carafate. Patient requesting something else for nausea. Dr. Read Drivers paged, made aware of above, order received for one time dose of phenergan 12.5 suppository. Night nurse made aware of new order and to give. [place], Kaden Daughdrill Selinda Eon

## 2014-02-03 NOTE — Progress Notes (Signed)
48 y.o. female with history of HTN, chronic back pain, morbid obesity who was admitted on 12/10/13 with BLE weakness, inability to walk as well as problems with urinary retention and constipation. Work up revealed a lesion at the T3 level of the upper thoracic spine with pathologic compression fracture and epidural tumor resulting in moderate cord compression with evidence of mild spinal cord edema, as well as right suprahilar mass with pulmonary nodules with mediastinal lymph nodes c/w metastatic disease, RUL & RLL PE, tubular cystic masses bilateral adnexal regions and a massive thyroid goiter causing tracheal deviation to the right with narrowing of trachea. She was started on steriods and foley placed for urinary retention. She underwent thoracic laminectomy with excision of tumor on 09/23 by Dr. Christella Noa and on bedrest pending further surgery. Pathology favoring metastatic adenocarcinoma of lung but metastatic thyroid cancer in differential . 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 Buddy Duty consulted for input. 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 Patient underwent T1-T5 posterolateral arthrodesis with allograft on 10/09 with plans XRT to be initiated 2 weeks post op. Dr. Constance Holster consulted for input and patient underwent total thyroidectomy on 10/19 and extubated without difficulty on 10/20.  Subjective/Complaints: Still feeling a little nauseas, throat a bit sore, still with some dysphagia   Vital Signs: Blood pressure 132/89, pulse 104, temperature 98.3 F (36.8 C), temperature source Oral, resp. rate 18, height 5' 11"  (1.803 m), weight 142.7 kg (314 lb 9.5 oz), last menstrual period 12/06/2013, SpO2 100 %. No results found. Results for orders placed or performed during the hospital encounter of 01/19/14 (from the past 72 hour(s))  Glucose, capillary     Status: Abnormal   Collection Time:  01/31/14 11:55 AM  Result Value Ref Range   Glucose-Capillary 143 (H) 70 - 99 mg/dL   Comment 1 Notify RN   Glucose, capillary     Status: Abnormal   Collection Time: 01/31/14  4:48 PM  Result Value Ref Range   Glucose-Capillary 161 (H) 70 - 99 mg/dL  Glucose, capillary     Status: Abnormal   Collection Time: 01/31/14  8:39 PM  Result Value Ref Range   Glucose-Capillary 137 (H) 70 - 99 mg/dL   Comment 1 Notify RN   Glucose, capillary     Status: Abnormal   Collection Time: 02/01/14  6:19 AM  Result Value Ref Range   Glucose-Capillary 116 (H) 70 - 99 mg/dL   Comment 1 Notify RN   Glucose, capillary     Status: Abnormal   Collection Time: 02/01/14 11:54 AM  Result Value Ref Range   Glucose-Capillary 139 (H) 70 - 99 mg/dL   Comment 1 Notify RN   Glucose, capillary     Status: Abnormal   Collection Time: 02/01/14  4:39 PM  Result Value Ref Range   Glucose-Capillary 144 (H) 70 - 99 mg/dL   Comment 1 Notify RN   Basic metabolic panel     Status: Abnormal   Collection Time: 02/01/14  6:45 PM  Result Value Ref Range   Sodium 136 (L) 137 - 147 mEq/L   Potassium 3.7 3.7 - 5.3 mEq/L   Chloride 98 96 - 112 mEq/L   CO2 27 19 - 32 mEq/L   Glucose, Bld 109 (H) 70 - 99 mg/dL   BUN 8 6 - 23 mg/dL   Creatinine, Ser 0.66 0.50 - 1.10 mg/dL   Calcium 8.4  8.4 - 10.5 mg/dL   GFR calc non Af Amer >90 >90 mL/min   GFR calc Af Amer >90 >90 mL/min    Comment: (NOTE) The eGFR has been calculated using the CKD EPI equation. This calculation has not been validated in all clinical situations. eGFR's persistently <90 mL/min signify possible Chronic Kidney Disease.    Anion gap 11 5 - 15  CBC     Status: Abnormal   Collection Time: 02/01/14  6:45 PM  Result Value Ref Range   WBC 5.3 4.0 - 10.5 K/uL   RBC 3.28 (L) 3.87 - 5.11 MIL/uL   Hemoglobin 9.3 (L) 12.0 - 15.0 g/dL   HCT 28.4 (L) 36.0 - 46.0 %   MCV 86.6 78.0 - 100.0 fL   MCH 28.4 26.0 - 34.0 pg   MCHC 32.7 30.0 - 36.0 g/dL   RDW 15.2  11.5 - 15.5 %   Platelets 217 150 - 400 K/uL  Glucose, capillary     Status: Abnormal   Collection Time: 02/01/14  8:51 PM  Result Value Ref Range   Glucose-Capillary 103 (H) 70 - 99 mg/dL   Comment 1 Notify RN   Glucose, capillary     Status: Abnormal   Collection Time: 02/02/14  6:18 AM  Result Value Ref Range   Glucose-Capillary 123 (H) 70 - 99 mg/dL   Comment 1 Notify RN   Glucose, capillary     Status: Abnormal   Collection Time: 02/02/14 11:55 AM  Result Value Ref Range   Glucose-Capillary 122 (H) 70 - 99 mg/dL  Glucose, capillary     Status: Abnormal   Collection Time: 02/02/14  4:32 PM  Result Value Ref Range   Glucose-Capillary 118 (H) 70 - 99 mg/dL  Glucose, capillary     Status: Abnormal   Collection Time: 02/02/14  9:39 PM  Result Value Ref Range   Glucose-Capillary 104 (H) 70 - 99 mg/dL  Glucose, capillary     Status: Abnormal   Collection Time: 02/03/14  7:14 AM  Result Value Ref Range   Glucose-Capillary 113 (H) 70 - 99 mg/dL      Nursing note and vitals reviewed.  Constitutional: She is oriented to person, place, and time. She appears well-developed and well-nourished.  HENT:  Head: Normocephalic and atraumatic.  Eyes: Conjunctivae are normal. Pupils are equal, round, and reactive to light.  Neck: Normal range of motion.  Cardiovascular: Normal rate and regular rhythm.  No murmur heard.  Respiratory: Effort normal and breath sounds normal. No respiratory distress. She has no wheezes. She exhibits no tenderness (sternal area with radiation markings.)  GI: Soft. Bowel sounds are normal. She exhibits no distension. There is no tenderness.  Musculoskeletal: She exhibits no edema and no tenderness.  Neurological: She is alert and oriented to person, place, and time.   Dense paraparesis with flexor spasms and 3 beats clonus on the right.  Diminished sensation 1/2 below lower/mid chest wall.   Skin: Skin is warm and dry.  Intergluteal and sacrum still a little  irritated. Psychiatric: She has a normal mood and affect. Her behavior is normal. Judgment and thought content normal.  Motor strength is 5/5 bilateral deltoids, biceps, triceps, grip  0/5 bilateral hip flexors knee extensors ankle dorsiflexor plantar flexor Tone 2/4 left greater than right LE's and fluctuating   Assessment/Plan: 1. Functional deficits secondary to Paraplegia which require 3+ hours per day of interdisciplinary therapy in a comprehensive inpatient rehab setting. Physiatrist is providing close team supervision  and 24 hour management of active medical problems listed below. Physiatrist and rehab team continue to assess barriers to discharge/monitor patient progress toward functional and medical goals.    FIM: FIM - Bathing Bathing Steps Patient Completed: Chest, Right Arm, Left Arm, Abdomen Bathing: 2: Max-Patient completes 3-4 54f10 parts or 25-49%  FIM - Upper Body Dressing/Undressing Upper body dressing/undressing steps patient completed: Thread/unthread right sleeve of pullover shirt/dresss, Thread/unthread left sleeve of pullover shirt/dress, Put head through opening of pull over shirt/dress Upper body dressing/undressing: 0: Wears gown/pajamas-no public clothing FIM - Lower Body Dressing/Undressing Lower body dressing/undressing: 0: Wears gInterior and spatial designer FIM - Toileting Toileting: 1: Total-Patient completed zero steps, helper did all 3  FIM - TAir cabin crewTransfers: 0-Activity did not occur  FIM - BControl and instrumentation engineerDevices: Bed rails, Arm rests, Sliding board Bed/Chair Transfer: 1: Mechanical lift, 1: Two helpers  FIM - Locomotion: Wheelchair Distance: 200 Locomotion: Wheelchair: 6: Travels 150 ft or more, turns around, maneuvers to table, bed or toilet, negotiates 3% grade: maneuvers on rugs and over door sills independently FIM - Locomotion: Ambulation Locomotion: Ambulation: 0: Activity did not  occur  Comprehension Comprehension Mode: Auditory Comprehension: 7-Follows complex conversation/direction: With no assist  Expression Expression Mode: Verbal Expression: 7-Expresses complex ideas: With no assist  Social Interaction Social Interaction: 7-Interacts appropriately with others - No medications needed.  Problem Solving Problem Solving: 7-Solves complex problems: Recognizes & self-corrects  Memory Memory: 7-Complete Independence: No helper  Medical Problem List and Plan:  1. 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   -xrt completed  -dysphagia after treatment---carafate ordered by rad/onc  -add cepacol spray also 2. DVT Prophylaxis/Anticoagulation: Pharmaceutical: Xarelto per Hem/Onc input.  3. Pain Management: increase baclofen to better manage spasticity 4. Mood:prn ativan for anxiety,       -pt aware of situational anxiety with therapy--some improvement? 5. Neuropsych: This patient is capable of making decisions on her own behalf.  6. Skin/Wound Care: Air mattress overlay for prevention of break down. Start educating patient and family on need for pressure relief measures.  7. Fluids/Electrolytes/Nutrition: encourage po intake..  .  8. Neurogenic bladder: foley 9. Neurogenic bowel/GI: glycerin suppository at HS  - getting closer to HS schedule  -reduce senokot s to help with stool consistency---2 qam only now  -loose stools better.  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 For RAI at 6-8 weeks post op.  11. HTN: Will monitor every 8 hours. No medications as this time. Monitor for orthostatic changes once OOB.  12 PCOS? With hyperglycemia: Hgb A1c-6.7.   -sugars reasonable 13. E coli UTI--keflex complete 14. Hemorrhoids: continue proctofoam cream bid in addition to good rectal hygiene to prevent irritation from stool.  15. Spasticity:  baclofen---increase to  139mTID  LOS (Days) 15 A FACE TO FACE EVALUATION WAS PERFORMED  SWARTZ,ZACHARY T 02/03/2014, 7:39 AM

## 2014-02-04 ENCOUNTER — Inpatient Hospital Stay (HOSPITAL_COMMUNITY): Payer: Medicaid Other | Admitting: Physical Therapy

## 2014-02-04 ENCOUNTER — Inpatient Hospital Stay (HOSPITAL_COMMUNITY): Payer: Medicaid Other | Admitting: Occupational Therapy

## 2014-02-04 DIAGNOSIS — N312 Flaccid neuropathic bladder, not elsewhere classified: Secondary | ICD-10-CM

## 2014-02-04 LAB — GLUCOSE, CAPILLARY
GLUCOSE-CAPILLARY: 105 mg/dL — AB (ref 70–99)
GLUCOSE-CAPILLARY: 102 mg/dL — AB (ref 70–99)
Glucose-Capillary: 110 mg/dL — ABNORMAL HIGH (ref 70–99)
Glucose-Capillary: 110 mg/dL — ABNORMAL HIGH (ref 70–99)

## 2014-02-04 MED ORDER — PROMETHAZINE HCL 12.5 MG RE SUPP
12.5000 mg | Freq: Three times a day (TID) | RECTAL | Status: DC | PRN
  Administered 2014-02-06 – 2014-02-09 (×5): 12.5 mg via RECTAL
  Filled 2014-02-04 (×5): qty 1

## 2014-02-04 NOTE — Progress Notes (Signed)
Physical Therapy Session Note  Patient Details  Name: Sarah [name] MRN: [id number] Date of Birth: 1965/10/10  Today's Date: 02/04/2014 PT Individual Time: 1440-1510 PT Individual Time Calculation (min): 30 min   Short Term Goals: Week 3:  PT Short Term Goal 1 (Week 3): STGs = LTGs due to ELOS  Skilled Therapeutic Interventions/Progress Updates:    Therapeutic Exercise: PT instructs pt on arm bike x 5 minutes at L1, 5 minutes at L2, and 5 minutes at L3 for endurance training. Pt had significant difficulty completing the full 5 minutes at L3 due to low endurance. Pt's SaO2 monitored and at 98% on RA prior to arm bike and pt drops to 97% after 15 minutes total of exercise.   Therapeutic Activity: Pt complains that seat belt will not latch well and PT explains that pt's hips are not all the way back in the w/c. PT instructs pt in tilting power w/c maximally, then instructs pt to push with both arms on armrest in an attempt to do a bottom lift, while PT assists in pulling pt's trunk from under the arms and PT Tech assists by pushing along line of the femurs to push hips back x 10 attempts. Pt resulted in getting hips all the way back in w/c and slight extra slack on seat belt.  PT MMT's pt's B UEs and finds B shoulder flexion 3+/5, B biceps and triceps 4/5, and B grips 5/5.   Pt's B UE endurance is starting to improve, but continues to be limited and pt continues to be unable to self propel a manual w/c for effective distances. Power w/c continues to be appropriate. Continue per PT POC.    Therapy Documentation Precautions:  Precautions Precautions: Fall, Back Precaution Comments: bil leg weakness, paraplegia Restrictions Weight Bearing Restrictions: No  Pain: Pain Assessment Pain Assessment: No/denies pain   See FIM for current functional status  Therapy/Group: Individual Therapy  Catrice Zuleta M 02/04/2014, 3:17 PM

## 2014-02-04 NOTE — Plan of Care (Signed)
Problem: SCI BOWEL ELIMINATION Goal: RH STG MANAGE BOWEL WITH ASSISTANCE STG Manage Bowel with max Assistance.  Outcome: Progressing  Problem: SCI BLADDER ELIMINATION Goal: RH STG MANAGE BLADDER WITH ASSISTANCE STG Manage Bladder With total Assistance (foley in place)  Outcome: Not Progressing Chronic catheter use  Problem: RH SKIN INTEGRITY Goal: RH STG SKIN FREE OF INFECTION/BREAKDOWN Skin free of infection/breakdown with moderatel assistance  Outcome: Progressing

## 2014-02-04 NOTE — Progress Notes (Signed)
Occupational Therapy Session Note  Patient Details  Name: Sarah [name] MRN: [id number] Date of Birth: 06-08-1965  Today's Date: 02/04/2014 OT Individual Time: 1500-1530 and 7076 to 65 OT individual time calculation=38 minutes    Skilled Therapeutic Interventions/Progress Updates: Addressed grooming at sink with set up from her power chair and UE theraband (yellow and green) exercises to increase endurance and strength to ultimately increase overall conditioning and self care    Therapy Documentation Precautions:  Precautions Precautions: Fall, Back Precaution Comments: bil leg weakness, paraplegia Restrictions Weight Bearing Restrictions: No  Pain: Pain Assessment Pain Assessment: No/denies pain  See FIM for current functional status  Therapy/Group: Individual Therapy  Alfredia Ferguson [facility] 02/04/2014, 4:56 PM

## 2014-02-04 NOTE — Progress Notes (Signed)
48 y.o. female with history of HTN, chronic back pain, morbid obesity who was admitted on 12/10/13 with BLE weakness, inability to walk as well as problems with urinary retention and constipation. Work up revealed a lesion at the T3 level of the upper thoracic spine with pathologic compression fracture and epidural tumor resulting in moderate cord compression with evidence of mild spinal cord edema, as well as right suprahilar mass with pulmonary nodules with mediastinal lymph nodes c/w metastatic disease, RUL & RLL PE, tubular cystic masses bilateral adnexal regions and a massive thyroid goiter causing tracheal deviation to the right with narrowing of trachea. She was started on steriods and foley placed for urinary retention. She underwent thoracic laminectomy with excision of tumor on 09/23 by Dr. Christella Noa and on bedrest pending further surgery. Pathology favoring metastatic adenocarcinoma of lung but metastatic thyroid cancer in differential . 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 Buddy Duty consulted for input. 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 Patient underwent T1-T5 posterolateral arthrodesis with allograft on 10/09 with plans XRT to be initiated 2 weeks post op. Dr. Constance Holster consulted for input and patient underwent total thyroidectomy on 10/19 and extubated without difficulty on 10/20.  Subjective/Complaints: Nausea improved with phenergan but not Zofran , no Loose stool .   Vital Signs: Blood pressure 125/76, pulse 104, temperature 98.6 F (37 C), temperature source Oral, resp. rate 18, height _0  (1.803 m), weight 142.7 kg (314 lb 9.5 oz), last menstrual period 12/06/2013, SpO2 100 %. No results found. Results for orders placed or performed during the hospital encounter of 01/19/14 (from the past 72 hour(s))  Glucose, capillary     Status: Abnormal   Collection Time: 02/01/14 11:54  AM  Result Value Ref Range   Glucose-Capillary 139 (H) 70 - 99 mg/dL   Comment 1 Notify RN   Glucose, capillary     Status: Abnormal   Collection Time: 02/01/14  4:39 PM  Result Value Ref Range   Glucose-Capillary 144 (H) 70 - 99 mg/dL   Comment 1 Notify RN   Basic metabolic panel     Status: Abnormal   Collection Time: 02/01/14  6:45 PM  Result Value Ref Range   Sodium 136 (L) 137 - 147 mEq/L   Potassium 3.7 3.7 - 5.3 mEq/L   Chloride 98 96 - 112 mEq/L   CO2 27 19 - 32 mEq/L   Glucose, Bld 109 (H) 70 - 99 mg/dL   BUN 8 6 - 23 mg/dL   Creatinine, Ser 0.66 0.50 - 1.10 mg/dL   Calcium 8.4 8.4 - 10.5 mg/dL   GFR calc non Af Amer >90 >90 mL/min   GFR calc Af Amer >90 >90 mL/min    Comment: (NOTE) The eGFR has been calculated using the CKD EPI equation. This calculation has not been validated in all clinical situations. eGFR's persistently <90 mL/min signify possible Chronic Kidney Disease.    Anion gap 11 5 - 15  CBC     Status: Abnormal   Collection Time: 02/01/14  6:45 PM  Result Value Ref Range   WBC 5.3 4.0 - 10.5 K/uL   RBC 3.28 (L) 3.87 - 5.11 MIL/uL   Hemoglobin 9.3 (L) 12.0 - 15.0 g/dL   HCT 28.4 (L) 36.0 - 46.0 %   MCV 86.6 78.0 - 100.0 fL   MCH 28.4 26.0 - 34.0 pg   MCHC 32.7 30.0 - 36.0  g/dL   RDW 15.2 11.5 - 15.5 %   Platelets 217 150 - 400 K/uL  Glucose, capillary     Status: Abnormal   Collection Time: 02/01/14  8:51 PM  Result Value Ref Range   Glucose-Capillary 103 (H) 70 - 99 mg/dL   Comment 1 Notify RN   Glucose, capillary     Status: Abnormal   Collection Time: 02/02/14  6:18 AM  Result Value Ref Range   Glucose-Capillary 123 (H) 70 - 99 mg/dL   Comment 1 Notify RN   Glucose, capillary     Status: Abnormal   Collection Time: 02/02/14 11:55 AM  Result Value Ref Range   Glucose-Capillary 122 (H) 70 - 99 mg/dL  Glucose, capillary     Status: Abnormal   Collection Time: 02/02/14  4:32 PM  Result Value Ref Range   Glucose-Capillary 118 (H) 70 - 99  mg/dL  Glucose, capillary     Status: Abnormal   Collection Time: 02/02/14  9:39 PM  Result Value Ref Range   Glucose-Capillary 104 (H) 70 - 99 mg/dL  Glucose, capillary     Status: Abnormal   Collection Time: 02/03/14  7:14 AM  Result Value Ref Range   Glucose-Capillary 113 (H) 70 - 99 mg/dL  Glucose, capillary     Status: Abnormal   Collection Time: 02/03/14 12:04 PM  Result Value Ref Range   Glucose-Capillary 137 (H) 70 - 99 mg/dL  Glucose, capillary     Status: Abnormal   Collection Time: 02/03/14  5:01 PM  Result Value Ref Range   Glucose-Capillary 120 (H) 70 - 99 mg/dL  Glucose, capillary     Status: Abnormal   Collection Time: 02/03/14  8:50 PM  Result Value Ref Range   Glucose-Capillary 113 (H) 70 - 99 mg/dL  Glucose, capillary     Status: Abnormal   Collection Time: 02/04/14  7:26 AM  Result Value Ref Range   Glucose-Capillary 110 (H) 70 - 99 mg/dL      Nursing note and vitals reviewed.  Constitutional: She is oriented to person, place, and time. She appears well-developed and well-nourished.  HENT:  Head: Normocephalic and atraumatic.  Eyes: Conjunctivae are normal. Pupils are equal, round, and reactive to light.  Neck: Normal range of motion.  Cardiovascular: Normal rate and regular rhythm.  No murmur heard.  Respiratory: Effort normal and breath sounds normal. No respiratory distress. She has no wheezes. She exhibits no tenderness (sternal area with radiation markings.)  GI: Soft. Bowel sounds are normal. She exhibits no distension. There is no tenderness.  Musculoskeletal: She exhibits no edema and no tenderness.  Neurological: She is alert and oriented to person, place, and time.   Dense paraparesis with flexor spasms and 3 beats clonus on the right.  Diminished sensation 1/2 below lower/mid chest wall.   Skin: Skin is warm and dry.   Psychiatric: She has a normal mood and affect. Her behavior is normal. Judgment and thought content normal.  Motor strength  is 5/5 bilateral deltoids, biceps, triceps, grip  0/5 bilateral hip flexors knee extensors ankle dorsiflexor plantar flexor Tone 2/4 left greater than right LE's and fluctuating   Assessment/Plan: 1. Functional deficits secondary to Paraplegia which require 3+ hours per day of interdisciplinary therapy in a comprehensive inpatient rehab setting. Physiatrist is providing close team supervision and 24 hour management of active medical problems listed below. Physiatrist and rehab team continue to assess barriers to discharge/monitor patient progress toward functional and medical goals.  FIM: FIM - Bathing Bathing Steps Patient Completed: Chest, Right Arm, Left Arm, Abdomen Bathing: 2: Max-Patient completes 3-4 21f10 parts or 25-49%  FIM - Upper Body Dressing/Undressing Upper body dressing/undressing steps patient completed: Thread/unthread right sleeve of pullover shirt/dresss, Thread/unthread left sleeve of pullover shirt/dress, Put head through opening of pull over shirt/dress Upper body dressing/undressing: 0: Wears gown/pajamas-no public clothing FIM - Lower Body Dressing/Undressing Lower body dressing/undressing: 1: Total-Patient completed less than 25% of tasks  FIM - Toileting Toileting: 1: Total-Patient completed zero steps, helper did all 3  FIM - TAir cabin crewTransfers: 0-Activity did not occur  FIM - BControl and instrumentation engineerDevices: Sliding board, Bed rails, Arm rests Bed/Chair Transfer: 3: Supine > Sit: Mod A (lifting assist/Pt. 50-74%/lift 2 legs, 4: Bed > Chair or W/C: Min A (steadying Pt. > 75%), 1: Two helpers, 1: Mechanical lift, 1: Chair or W/C > Bed: Total A (helper does all/Pt. < 25%)  FIM - Locomotion: Wheelchair Distance: 300 Locomotion: Wheelchair: 6: Travels 150 ft or more, turns around, maneuvers to table, bed or toilet, negotiates 3% grade: maneuvers on rugs and over door sills independently FIM - Locomotion:  Ambulation Locomotion: Ambulation: 0: Activity did not occur  Comprehension Comprehension Mode: Auditory Comprehension: 7-Follows complex conversation/direction: With no assist  Expression Expression Mode: Verbal Expression: 7-Expresses complex ideas: With no assist  Social Interaction Social Interaction: 7-Interacts appropriately with others - No medications needed.  Problem Solving Problem Solving: 7-Solves complex problems: Recognizes & self-corrects  Memory Memory: 7-Complete Independence: No helper  Medical Problem List and Plan:  1. 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   -xrt completed 2. DVT Prophylaxis/Anticoagulation: Pharmaceutical: Xarelto per Hem/Onc input.  3. Pain Management: increase baclofen to better manage spasticity 4. Mood:prn ativan for anxiety,       -pt aware of situational anxiety with therapy--some improvement? 5. Neuropsych: This patient is capable of making decisions on her own behalf.  6. Skin/Wound Care: Air mattress overlay for prevention of break down. Start educating patient and family on need for pressure relief measures.  7. Fluids/Electrolytes/Nutrition: encourage po intake..  .  8. Neurogenic bladder: foley 9. 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 For RAI at 6-8 weeks post op.  11. HTN: Will monitor every 8 hours. No medications as this time. Monitor for orthostatic changes once OOB.  12 PCOS? With hyperglycemia: Hgb A1c-6.7.   -sugars reasonable 13. E coli UTI--keflex complete 14. Hemorrhoids: continue proctofoam cream bid in addition to good rectal hygiene to prevent irritation from stool.  15. Spasticity: baclofen trial 175mTID  LOS (Days)  16 A FACE TO FACE EVALUATION WAS PERFORMED  Kekai Geter E 02/04/2014, 8:10 AM

## 2014-02-05 ENCOUNTER — Inpatient Hospital Stay (HOSPITAL_COMMUNITY): Payer: Medicaid Other

## 2014-02-05 ENCOUNTER — Inpatient Hospital Stay (HOSPITAL_COMMUNITY): Payer: Medicaid Other | Admitting: Physical Therapy

## 2014-02-05 LAB — GLUCOSE, CAPILLARY
Glucose-Capillary: 90 mg/dL (ref 70–99)
Glucose-Capillary: 112 mg/dL — ABNORMAL HIGH (ref 70–99)
Glucose-Capillary: 102 mg/dL — ABNORMAL HIGH (ref 70–99)
Glucose-Capillary: 92 mg/dL (ref 70–99)

## 2014-02-05 NOTE — Progress Notes (Signed)
Physical Therapy Session Note  Patient Details  Name: BAILEI BUIST MRN: [id number] Date of Birth: 1965-06-08  Today's Date: 02/05/2014 PT Individual Time: 1045-1130 PT Individual Time Calculation (min): 45 min    Skilled Therapeutic Interventions/Progress Updates:   Session focused on donning clothing in supine with focus on rolling technique for clothing management with max A for rolling, +2 for supine to sit EOB and cues for technique, +2 with mod to max A for slideboard transfer slightly downhill from bed to power w/c with cues for technique and with encouragement for pt to direct care. Total A  for repositioning in the w/c. Pt performed self care tasks at sink with set-up assist from w/c level. Mod I w/c mobility in power chair on unit and practiced having pt go through pull and push doors for community mobility re-training at supervision to mod I level. Left in gym for next PT session.    Therapy Documentation Precautions:  Precautions Precautions: Fall, Back Precaution Comments: bil leg weakness, paraplegia Restrictions Weight Bearing Restrictions: No  Pain: C/o pain in her L stomach area and R leg - RN already aware per pt report.   See FIM for current functional status  Therapy/Group: Individual Therapy  [facility] 02/05/2014, 11:59 AM

## 2014-02-05 NOTE — Progress Notes (Signed)
Physical Therapy Session Note  Patient Details  Name: Sarah [name] MRN: [id number] Date of Birth: Oct 29, 1965  Today's Date: 02/05/2014 PT Individual Time: 1130-1200 PT Individual Time Calculation (min): 30 min   Short Term Goals: Week 1:  PT Short Term Goal 1 (Week 1): Pt will roll R and L in bed without rail req min A.  PT Short Term Goal 1 - Progress (Week 1): Updated due to goal met PT Short Term Goal 2 (Week 1): Pt will demonstrate supine to sit transfer with rail and 1 person assist.  PT Short Term Goal 2 - Progress (Week 1): Updated due to goal met PT Short Term Goal 3 (Week 1): Pt will scoot transfer bed to/from w/c with slideboard and 1 person assist PT Short Term Goal 3 - Progress (Week 1): Progressing toward goal PT Short Term Goal 4 (Week 1): Pt will initiate w/c propulsion.  PT Short Term Goal 4 - Progress (Week 1): Updated due to goal met PT Short Term Goal 5 (Week 1): Pt will tolerate dynamic sit balance req mod A.  PT Short Term Goal 5 - Progress (Week 1): Updated due to goal met  Skilled Therapeutic Interventions/Progress Updates:  Pt was seen in rehab gym. Pt repositioned in power w/c with max A. Performed stretching B LEs. Pt pedaled Scifi x 10 minutes at level 2, no rest break. Pt drove power w/c back to room [place].   Therapy Documentation Precautions:  Precautions Precautions: Fall, Back Precaution Comments: bil leg weakness, paraplegia Restrictions Weight Bearing Restrictions: No General:  Pain: Pt c/o upset stomach.   See FIM for current functional status  Therapy/Group: Individual Therapy  Dub Amis 02/05/2014, 12:59 PM

## 2014-02-05 NOTE — Progress Notes (Signed)
48 y.o. female with history of HTN, chronic back pain, morbid obesity who was admitted on 12/10/13 with BLE weakness, inability to walk as well as problems with urinary retention and constipation. Work up revealed a lesion at the T3 level of the upper thoracic spine with pathologic compression fracture and epidural tumor resulting in moderate cord compression with evidence of mild spinal cord edema, as well as right suprahilar mass with pulmonary nodules with mediastinal lymph nodes c/w metastatic disease, RUL & RLL PE, tubular cystic masses bilateral adnexal regions and a massive thyroid goiter causing tracheal deviation to the right with narrowing of trachea. She was started on steriods and foley placed for urinary retention. She underwent thoracic laminectomy with excision of tumor on 09/23 by Dr. Christella Noa and on bedrest pending further surgery. Pathology favoring metastatic adenocarcinoma of lung but metastatic thyroid cancer in differential . 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 Buddy Duty consulted for input. 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 Patient underwent T1-T5 posterolateral arthrodesis with allograft on 10/09 with plans XRT to be initiated 2 weeks post op. Dr. Constance Holster consulted for input and patient underwent total thyroidectomy on 10/19 and extubated without difficulty on 10/20.  Subjective/Complaints:  No issues overnite except no results with bowel program No vomiting , mild nausea  ROS:  Spasms in legs mild, don't interfere with therapy or sleep, o/w neg  Vital Signs: Blood pressure 131/72, pulse 97, temperature 98 F (36.7 C), temperature source Oral, resp. rate 18, height 5\' 11"  (1.803 m), weight 142.7 kg (314 lb 9.5 oz), last menstrual period 12/06/2013, SpO2 95 %. No results found. Results for orders placed or performed during the hospital encounter of 01/19/14 (from the  past 72 hour(s))  Glucose, capillary     Status: Abnormal   Collection Time: 02/02/14 11:55 AM  Result Value Ref Range   Glucose-Capillary 122 (H) 70 - 99 mg/dL  Glucose, capillary     Status: Abnormal   Collection Time: 02/02/14  4:32 PM  Result Value Ref Range   Glucose-Capillary 118 (H) 70 - 99 mg/dL  Glucose, capillary     Status: Abnormal   Collection Time: 02/02/14  9:39 PM  Result Value Ref Range   Glucose-Capillary 104 (H) 70 - 99 mg/dL  Glucose, capillary     Status: Abnormal   Collection Time: 02/03/14  7:14 AM  Result Value Ref Range   Glucose-Capillary 113 (H) 70 - 99 mg/dL  Glucose, capillary     Status: Abnormal   Collection Time: 02/03/14 12:04 PM  Result Value Ref Range   Glucose-Capillary 137 (H) 70 - 99 mg/dL  Glucose, capillary     Status: Abnormal   Collection Time: 02/03/14  5:01 PM  Result Value Ref Range   Glucose-Capillary 120 (H) 70 - 99 mg/dL  Glucose, capillary     Status: Abnormal   Collection Time: 02/03/14  8:50 PM  Result Value Ref Range   Glucose-Capillary 113 (H) 70 - 99 mg/dL  Glucose, capillary     Status: Abnormal   Collection Time: 02/04/14  7:26 AM  Result Value Ref Range   Glucose-Capillary 110 (H) 70 - 99 mg/dL  Glucose, capillary     Status: Abnormal   Collection Time: 02/04/14 11:39 AM  Result Value Ref Range   Glucose-Capillary 105 (H) 70 - 99 mg/dL  Glucose, capillary     Status: Abnormal   Collection Time: 02/04/14  4:21 PM  Result Value Ref Range   Glucose-Capillary 102 (H) 70 - 99 mg/dL  Glucose, capillary     Status: Abnormal   Collection Time: 02/04/14  9:11 PM  Result Value Ref Range   Glucose-Capillary 110 (H) 70 - 99 mg/dL  Glucose, capillary     Status: None   Collection Time: 02/05/14  6:38 AM  Result Value Ref Range   Glucose-Capillary 90 70 - 99 mg/dL      Nursing note and vitals reviewed.  Constitutional: She is oriented to person, place, and time. She appears well-developed and well-nourished.  HENT:   Head: Normocephalic and atraumatic.  Eyes: Conjunctivae are normal. Pupils are equal, round, and reactive to light.  Neck: Normal range of motion.  Cardiovascular: Normal rate and regular rhythm.  No murmur heard.  Respiratory: Effort normal and breath sounds normal. No respiratory distress. She has no wheezes. She exhibits no tenderness (sternal area with radiation markings.)  GI: Soft. Bowel sounds are normal. She exhibits no distension. There is no tenderness.  Musculoskeletal: She exhibits no edema and no tenderness.  Neurological: She is alert and oriented to person, place, and time.   Dense paraparesis with flexor spasms and 3 beats clonus on the right.  Diminished sensation 1/2 below lower/mid chest wall.   Skin: Skin is warm and dry.   Psychiatric: She has a normal mood and affect. Her behavior is normal. Judgment and thought content normal.  Motor strength is 5/5 bilateral deltoids, biceps, triceps, grip  0/5 bilateral hip flexors knee extensors ankle dorsiflexor plantar flexor Tone 2/4 left greater than right LE's and fluctuating   Assessment/Plan: 1. Functional deficits secondary to Paraplegia which require 3+ hours per day of interdisciplinary therapy in a comprehensive inpatient rehab setting. Physiatrist is providing close team supervision and 24 hour management of active medical problems listed below. Physiatrist and rehab team continue to assess barriers to discharge/monitor patient progress toward functional and medical goals.    FIM: FIM - Bathing Bathing Steps Patient Completed: Chest, Right Arm, Left Arm, Abdomen Bathing: 2: Max-Patient completes 3-4 10f 10 parts or 25-49%  FIM - Upper Body Dressing/Undressing Upper body dressing/undressing steps patient completed: Thread/unthread right sleeve of pullover shirt/dresss, Thread/unthread left sleeve of pullover shirt/dress, Put head through opening of pull over shirt/dress Upper body dressing/undressing: 0: Wears  gown/pajamas-no public clothing FIM - Lower Body Dressing/Undressing Lower body dressing/undressing: 1: Total-Patient completed less than 25% of tasks  FIM - Toileting Toileting: 1: Total-Patient completed zero steps, helper did all 3  FIM - Air cabin crew Transfers: 0-Activity did not occur  FIM - Control and instrumentation engineer Devices: Sliding board, Bed rails, Arm rests Bed/Chair Transfer: 0: Activity did not occur  FIM - Locomotion: Wheelchair Distance: 150 Locomotion: Wheelchair: 6: Travels 150 ft or more, turns around, maneuvers to table, bed or toilet, negotiates 3% grade: maneuvers on rugs and over door sills independently FIM - Locomotion: Ambulation Locomotion: Ambulation: 0: Activity did not occur  Comprehension Comprehension Mode: Auditory Comprehension: 7-Follows complex conversation/direction: With no assist  Expression Expression Mode: Verbal Expression: 7-Expresses complex ideas: With no assist  Social Interaction Social Interaction: 7-Interacts appropriately with others - No medications needed.  Problem Solving Problem Solving: 7-Solves complex problems: Recognizes & self-corrects  Memory Memory: 7-Complete Independence: No helper  Medical Problem List and Plan:  1. 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   -xrt completed 2. DVT Prophylaxis/Anticoagulation: Pharmaceutical: Xarelto per  Hem/Onc input.  3. Pain Management: increase baclofen to better manage spasticity 4. Mood:prn ativan for anxiety,       -pt aware of situational anxiety with therapy--some improvement? 5. Neuropsych: This patient is capable of making decisions on her own behalf.  6. Skin/Wound Care: Air mattress overlay for prevention of break down. Start educating patient and family on need for pressure relief measures.  7. Fluids/Electrolytes/Nutrition: encourage po intake..   .  8. Neurogenic bladder: foley 9. 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 For RAI at 6-8 weeks post op.  11. HTN: Will monitor every 8 hours. No medications as this time. Monitor for orthostatic changes once OOB.  12 PCOS? With hyperglycemia: Hgb A1c-6.7.   -sugars reasonable 13. E coli UTI--keflex complete 14. Hemorrhoids: continue proctofoam cream bid in addition to good rectal hygiene to prevent irritation from stool.  15. Spasticity: baclofen trial 10mg  TID  LOS (Days) 17 A FACE TO FACE EVALUATION WAS PERFORMED  [place] Athanas E 02/05/2014, 7:20 AM

## 2014-02-06 ENCOUNTER — Inpatient Hospital Stay (HOSPITAL_COMMUNITY): Payer: Medicaid Other

## 2014-02-06 DIAGNOSIS — R1314 Dysphagia, pharyngoesophageal phase: Secondary | ICD-10-CM | POA: Diagnosis not present

## 2014-02-06 LAB — GLUCOSE, CAPILLARY
GLUCOSE-CAPILLARY: 120 mg/dL — AB (ref 70–99)
GLUCOSE-CAPILLARY: 128 mg/dL — AB (ref 70–99)
Glucose-Capillary: 105 mg/dL — ABNORMAL HIGH (ref 70–99)
Glucose-Capillary: 97 mg/dL (ref 70–99)

## 2014-02-06 MED ORDER — POLYETHYLENE GLYCOL 3350 17 G PO PACK
17.0000 g | PACK | Freq: Once | ORAL | Status: AC
  Administered 2014-02-06: 17 g via ORAL
  Filled 2014-02-06: qty 1

## 2014-02-06 NOTE — Plan of Care (Signed)
Problem: SCI BOWEL ELIMINATION Goal: RH STG MANAGE BOWEL WITH ASSISTANCE STG Manage Bowel with max Assistance.  Outcome: Progressing Goal: RH STG SCI MANAGE BOWEL WITH MEDICATION WITH ASSISTANCE STG SCI Manage bowel with medication with minimal assistance.  Outcome: Progressing Goal: RH STG MANAGE BOWEL W/EQUIPMENT W/ASSISTANCE STG Manage Bowel With Equipment With moderate Assistance  Outcome: Progressing Goal: RH STG SCI MANAGE BOWEL PROGRAM W/ASSIST OR AS APPROPRIATE STG SCI Manage bowel program w/ max assist or as appropriate.  Outcome: Progressing  Problem: SCI BLADDER ELIMINATION Goal: RH STG MANAGE BLADDER WITH ASSISTANCE STG Manage Bladder With total Assistance (foley in place)  Outcome: Progressing Goal: RH STG SCI MANAGE BLADDER PROGRAM W/ASSISTANCE Outcome: Progressing  Problem: RH SKIN INTEGRITY Goal: RH STG SKIN FREE OF INFECTION/BREAKDOWN Skin free of infection/breakdown with moderatel assistance  Outcome: Progressing Goal: RH STG MAINTAIN SKIN INTEGRITY WITH ASSISTANCE STG Maintain Skin Integrity With moderate Assistance.  Outcome: Progressing Goal: RH STG ABLE TO PERFORM INCISION/WOUND CARE W/ASSISTANCE STG Able To Perform Incision/Wound Care With total Assistance of back incision.  Outcome: Progressing  Problem: RH SAFETY Goal: RH STG DEMO UNDERSTANDING HOME SAFETY PRECAUTIONS Outcome: Not Applicable Date Met:  40/76/80  Problem: RH PAIN MANAGEMENT Goal: RH STG PAIN MANAGED AT OR BELOW PT'S PAIN GOAL Pain less than 3  Outcome: Progressing

## 2014-02-06 NOTE — Progress Notes (Signed)
Orthopedic Tech Progress Note Patient Details:  [name] 03/29/1965 [id number] Called in order to [facility]. Patient ID: Sarah [name], female   DOB: [birth year], 49 y.o.   MRN: [id number]   Darrol Poke 02/06/2014, 3:29 PM

## 2014-02-06 NOTE — Progress Notes (Signed)
48 y.o. female with history of HTN, chronic back pain, morbid obesity who was admitted on 12/10/13 with BLE weakness, inability to walk as well as problems with urinary retention and constipation. Work up revealed a lesion at the T3 level of the upper thoracic spine with pathologic compression fracture and epidural tumor resulting in moderate cord compression with evidence of mild spinal cord edema, as well as right suprahilar mass with pulmonary nodules with mediastinal lymph nodes c/w metastatic disease, RUL & RLL PE, tubular cystic masses bilateral adnexal regions and a massive thyroid goiter causing tracheal deviation to the right with narrowing of trachea. She was started on steriods and foley placed for urinary retention. She underwent thoracic laminectomy with excision of tumor on 09/23 by Dr. Christella Noa and on bedrest pending further surgery. Pathology favoring metastatic adenocarcinoma of lung but metastatic thyroid cancer in differential . 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 Buddy Duty consulted for input. 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 Patient underwent T1-T5 posterolateral arthrodesis with allograft on 10/09 with plans XRT to be initiated 2 weeks post op. Dr. Constance Holster consulted for input and patient underwent total thyroidectomy on 10/19 and extubated without difficulty on 10/20.  Subjective/Complaints:    Still with swallowing difficulties, more often in the evening. Can't say if it's more for solids than liquids. Has some hesitancy to eat at times. Ate less this weekend. N/v better ROS: LE spasticity    Vital Signs: Blood pressure 120/81, pulse 92, temperature 98.2 F (36.8 C), temperature source Oral, resp. rate 18, height 5\' 11"  (1.803 m), weight 142.7 kg (314 lb 9.5 oz), last menstrual period 12/06/2013, SpO2 96 %. No results found. Results for orders placed or performed during  the hospital encounter of 01/19/14 (from the past 72 hour(s))  Glucose, capillary     Status: Abnormal   Collection Time: 02/03/14 12:04 PM  Result Value Ref Range   Glucose-Capillary 137 (H) 70 - 99 mg/dL  Glucose, capillary     Status: Abnormal   Collection Time: 02/03/14  5:01 PM  Result Value Ref Range   Glucose-Capillary 120 (H) 70 - 99 mg/dL  Glucose, capillary     Status: Abnormal   Collection Time: 02/03/14  8:50 PM  Result Value Ref Range   Glucose-Capillary 113 (H) 70 - 99 mg/dL  Glucose, capillary     Status: Abnormal   Collection Time: 02/04/14  7:26 AM  Result Value Ref Range   Glucose-Capillary 110 (H) 70 - 99 mg/dL  Glucose, capillary     Status: Abnormal   Collection Time: 02/04/14 11:39 AM  Result Value Ref Range   Glucose-Capillary 105 (H) 70 - 99 mg/dL  Glucose, capillary     Status: Abnormal   Collection Time: 02/04/14  4:21 PM  Result Value Ref Range   Glucose-Capillary 102 (H) 70 - 99 mg/dL  Glucose, capillary     Status: Abnormal   Collection Time: 02/04/14  9:11 PM  Result Value Ref Range   Glucose-Capillary 110 (H) 70 - 99 mg/dL  Glucose, capillary     Status: None   Collection Time: 02/05/14  6:38 AM  Result Value Ref Range   Glucose-Capillary 90 70 - 99 mg/dL  Glucose, capillary     Status: Abnormal   Collection Time: 02/05/14 11:58 AM  Result Value Ref Range   Glucose-Capillary 112 (H) 70 - 99 mg/dL   Comment 1 Notify RN  Glucose, capillary     Status: Abnormal   Collection Time: 02/05/14  4:42 PM  Result Value Ref Range   Glucose-Capillary 102 (H) 70 - 99 mg/dL   Comment 1 Notify RN   Glucose, capillary     Status: None   Collection Time: 02/05/14  8:50 PM  Result Value Ref Range   Glucose-Capillary 92 70 - 99 mg/dL  Glucose, capillary     Status: Abnormal   Collection Time: 02/06/14  6:44 AM  Result Value Ref Range   Glucose-Capillary 105 (H) 70 - 99 mg/dL      Nursing note and vitals reviewed.  Constitutional: She is oriented to  person, place, and time. She appears well-developed and well-nourished.  HENT:  Head: Normocephalic and atraumatic.  Eyes: Conjunctivae are normal. Pupils are equal, round, and reactive to light.  Neck: Normal range of motion.  Cardiovascular: Normal rate and regular rhythm.  No murmur heard.  Respiratory: Effort normal and breath sounds normal. No respiratory distress. She has no wheezes. She exhibits no tenderness (sternal area with radiation markings.)  GI: Soft. Bowel sounds are normal. She exhibits no distension. There is no tenderness.  Musculoskeletal: She exhibits no edema and no tenderness.  Neurological: She is alert and oriented to person, place, and time.   Dense paraparesis with flexor spasms and 3 beats clonus on the right.  Diminished sensation 1/2 below lower/mid chest wall.   Skin: Skin is warm and dry.   Psychiatric: She has a normal mood and affect. Her behavior is normal. Judgment and thought content normal.  Motor strength is 5/5 bilateral deltoids, biceps, triceps, grip  0/5 bilateral hip flexors knee extensors ankle dorsiflexor plantar flexor Tone 2/4 left greater than right LE's and fluctuating   Assessment/Plan: 1. Functional deficits secondary to Paraplegia which require 3+ hours per day of interdisciplinary therapy in a comprehensive inpatient rehab setting. Physiatrist is providing close team supervision and 24 hour management of active medical problems listed below. Physiatrist and rehab team continue to assess barriers to discharge/monitor patient progress toward functional and medical goals.    FIM: FIM - Bathing Bathing Steps Patient Completed: Chest, Right Arm, Left Arm, Abdomen Bathing: 2: Max-Patient completes 3-4 12f 10 parts or 25-49%  FIM - Upper Body Dressing/Undressing Upper body dressing/undressing steps patient completed: Thread/unthread right sleeve of pullover shirt/dresss, Thread/unthread left sleeve of pullover shirt/dress, Put head  through opening of pull over shirt/dress Upper body dressing/undressing: 0: Wears gown/pajamas-no public clothing FIM - Lower Body Dressing/Undressing Lower body dressing/undressing: 1: Total-Patient completed less than 25% of tasks  FIM - Toileting Toileting: 1: Total-Patient completed zero steps, helper did all 3  FIM - Air cabin crew Transfers: 0-Activity did not occur  FIM - Control and instrumentation engineer Devices: Sliding board, Bed rails, Arm rests Bed/Chair Transfer: 1: Two helpers  FIM - Locomotion: Wheelchair Distance: 150 Locomotion: Wheelchair: 6: Travels 150 ft or more, turns around, maneuvers to table, bed or toilet, negotiates 3% grade: maneuvers on rugs and over door sills independently FIM - Locomotion: Ambulation Locomotion: Ambulation: 0: Activity did not occur  Comprehension Comprehension Mode: Auditory Comprehension: 7-Follows complex conversation/direction: With no assist  Expression Expression Mode: Verbal Expression: 7-Expresses complex ideas: With no assist  Social Interaction Social Interaction: 7-Interacts appropriately with others - No medications needed.  Problem Solving Problem Solving: 7-Solves complex problems: Recognizes & self-corrects  Memory Memory: 7-Complete Independence: No helper  Medical Problem List and Plan:  1. 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   -xrt completed 2. DVT Prophylaxis/Anticoagulation: Pharmaceutical: Xarelto per Hem/Onc input.  3. Pain Management: increase baclofen to better manage spasticity 4. Mood:prn ativan for anxiety,       -pt aware of situational anxiety with therapy--some improvement? 5. Neuropsych: This patient is capable of making decisions on her own behalf.  6. Skin/Wound Care: Air mattress overlay for prevention of break down. Start educating patient and family on need for pressure  relief measures.  7. Fluids/Electrolytes/Nutrition: encourage po intake..  .  8. Neurogenic bladder: foley 9. 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 For RAI at 6-8 weeks post op.  11. HTN: Will monitor every 8 hours. No medications as this time. Monitor for orthostatic changes once OOB.  12   Hyperglycemia: Hgb A1c-6.7.   -sugars reasonable 13. E coli UTI--keflex complete 14. Hemorrhoids: continue proctofoam cre am bid in addition to good rectal hygiene to prevent irritation from stool.  15. Spasticity: baclofen trial 10mg  TID 16. Dysphagia: will go ahead and ask SLP to see--pt agrees, PO intake decreased  LOS (Days) 18 A FACE TO FACE EVALUATION WAS PERFORMED  Alfonzo Arca T 02/06/2014, 7:44 AM

## 2014-02-06 NOTE — Progress Notes (Signed)
Recreational Therapy Session Note  Patient Details  Name: Sarah [name] MRN: [id number] Date of Birth: [birth year] Today's Date: 02/06/2014  Pain: no c/o Skilled Therapeutic Interventions/Progress Updates: Session focused on discharge planning in regards to home set up, equipment placement & overall safety.  Emotional support provided. [place] 02/06/2014, 3:03 PM

## 2014-02-06 NOTE — Progress Notes (Signed)
Occupational Therapy Session Note  Patient Details  Name: Sarah [name] MRN: [id number] Date of Birth: Mar 09, 1966  Today's Date: 02/06/2014 OT Individual Time: 1030-1130 OT Individual Time Calculation (min): 60 min    Short Term Goals: Week 3:  OT Short Term Goal 1 (Week 3): Pt will demo ability to complete slide board transfer from bed to w/c with mod A X1  OT Short Term Goal 2 (Week 3): Pt will demo ability to sustain supported sitting balance at raised mat while performing a functional task for 5 minutes with only supervision for safety. OT Short Term Goal 3 (Week 3): Pt will demo ability to complete 10 min of BUE arm ergometry at Sci-Fit, level 1, with supervision and setup, to improve general endurance OT Short Term Goal 4 (Week 3): Pt will demo ability to complete BUE HEP (7 exercises) using thera-band with setup assist. OT Short Term Goal 5 (Week 3): Pt will demo ability to access refrigerator from [facility] or WC to retrieve beverage using appropriate AE.  Skilled Therapeutic Interventions/Progress Updates: ADL-retraining (30 min) with focus on bed mobility, improved anticipatory awareness prior to discharge, use of AE (leg loops) during bed mobility and role of director to caregiver with assisted repositioning and BADL.   Pt received supine in bed reporting continued discomfort (throat) but now aware of effect of head of bed positioning to reduce discomfort.   Pt was educated on use of leg loops and was assessed for  leg loops this session, measured for custom fit by rehab tech assigned (KO).   Pt then required PROM to BLE in prep for bed mobility d/t spasms, total assist to don TEDs, but only min assist to don sling d/t improved performance with bed mobility after PROM.   Using Maxi-move to transfer to [facility], pt propelled w/c to gym and drove to Sci-FIt to perform BUE ergometry (30 min therapeutic activities).   With setup assist, pt completed 10 min of ergometry although not continuous d/t  repeated requests to reposition in [place].   Pt demo'd improved awareness of tilt/shift of body mass off center of [place] but continues to require total assist to reposition d/t BUE weakness and functional paraplegia.   Pt has demonstrated proficiency with directing caregivers as needed for improved pain management and assist with weight-shifting while seated.  Therapy Documentation Precautions:  Precautions Precautions: Fall, Back Precaution Comments: bil leg weakness, paraplegia Restrictions Weight Bearing Restrictions: No  Pain: Pain Assessment Pain Score: 2   ADL: ADL ADL Comments: see FIM  See FIM for current functional status  Therapy/Group: Individual Therapy  [place] 02/06/2014, 2:09 PM

## 2014-02-06 NOTE — Plan of Care (Signed)
Problem: RH PAIN MANAGEMENT Goal: RH STG PAIN MANAGED AT OR BELOW PT'S PAIN GOAL Pain less than 3  Outcome: Progressing

## 2014-02-06 NOTE — Plan of Care (Signed)
Problem: RH SAFETY Goal: RH STG ADHERE TO SAFETY PRECAUTIONS W/ASSISTANCE/DEVICE STG Adhere to Safety Precautions With minimal Assistance/Device.  Outcome: Completed/Met Date Met:  02/06/14

## 2014-02-06 NOTE — Progress Notes (Signed)
Physical Therapy Session Note  Patient Details  Name: Sarah [name] MRN: [id number] Date of Birth: 1965-08-04  Today's Date: 02/06/2014 PT Individual Time: 0900-1000 PT Individual Time Calculation (min): 60 min   Session 2 Time: 1300-1400 Time Calculation (min): 60 min Session 3 Time: 3220-2542 Time Calculation (min): 60 min  Short Term Goals: Week 3:  PT Short Term Goal 1 (Week 3): STGs = LTGs due to ELOS  Skilled Therapeutic Interventions/Progress Updates:    Session 1: Pt received supine in bed, agreeable to participate in therapy. Rolled L/R w/ MinA to don brief and dress LB (TotalA for dressing LB). Pt educated on using chair position on bed in order to sit up to eat due to esophagus pain. Rolled L w/ MinA, then moved L sidelying to sit EOB w/ ModA and use of HOB functions and bedrails. Pt tolerated sitting EOB for 30 minutes, SBA to MinA to maintain seated balance. Set up breakfast tray in front of pt so that pt could complete functional tasks while working on seated balance. Worked on anterior weight shifts w./ MaxA for improved slide board transfers. Stretching for HS and calves while seated EOB for tone management, 3x30" ea. Transferred sit>supine w/ ModA for managing BLE into bed, then repositioned towards [facility] w/ use of Trendelenburg function, MinA to scoot up. Pt left supine in bed w/ all needs within reach.   Session 2: Pt received seated in w/c, agreeable to participate in therapy. Pt propelled w/c 200' to rehab gym w/ mod (I). Sliding board transfer w/c<>mat table w/ +2A. Pt tolerated sitting EOM for 30' while therapist worked on adjusting wheelchair in attempt to widen seat pan for improved fit. Pt benefited from SBA to maintain balance, did not require any physical assist while holding on w/ BUE and BLE supported on step. Pt propelled w/c back to room w/ mod (I). Pt left seated in w/c w/ all needs within reach.   Session 3: Pt received seated in w/c, agreeable to participate  in therapy. Propelled power w/c to/from rehab gym w/ mod (I). Sliding board transfer w/c>mat w/ +2A, mat>w/c w/ Max-TotalA of 1. Pt able to name steps of transfer w/ min cueing. Seated EOM worked on trunk control/balance exercises including reaching for basketball, throwing basketball, catching basketball, moving sit<>prop on elbow, and leaning forward with arms on therapist forearms to encourage anterior weight shift during transfer. Pt continues to demonstrate difficulty stabilizing proximally enough to move both arms outside BOS. Pt transferred w/c>bed w/ use of MaxiMove lift due to fatigue at end of day. Pt left supine in bed w/ all needs within reach.   Therapy Documentation Precautions:  Precautions Precautions: Fall, Back Precaution Comments: bil leg weakness, paraplegia Restrictions Weight Bearing Restrictions: No General:   Vital Signs: Therapy Vitals Temp: 98.2 F (36.8 C) Temp Source: Oral Pulse Rate: 92 Resp: 18 BP: 120/81 mmHg Patient Position (if appropriate): Lying Oxygen Therapy SpO2: 96 % O2 Device: Not Delivered Pain: Pain Assessment Pain Assessment: 0-10 Pain Score: 3  Pain Type: Acute pain Pain Location: Leg (Rt and Lt) Pain Onset: On-going Pain Intervention(s): Medication (See eMAR);Repositioned;Emotional support PAINAD (Pain Assessment in Advanced Dementia) Breathing: normal Negative Vocalization: none Facial Expression: smiling or inexpressive Body Language: relaxed Consolability: no need to console PAINAD Score: 0  See FIM for current functional status  Therapy/Group: Individual Therapy  Rada Hay  Rada Hay, PT, DPT 02/06/2014, 7:40 AM

## 2014-02-07 ENCOUNTER — Inpatient Hospital Stay (HOSPITAL_COMMUNITY): Payer: Medicaid Other | Admitting: *Deleted

## 2014-02-07 ENCOUNTER — Inpatient Hospital Stay (HOSPITAL_COMMUNITY): Payer: Medicaid Other | Admitting: Speech Pathology

## 2014-02-07 ENCOUNTER — Inpatient Hospital Stay (HOSPITAL_COMMUNITY): Payer: Medicaid Other

## 2014-02-07 ENCOUNTER — Inpatient Hospital Stay (HOSPITAL_COMMUNITY): Payer: Medicaid Other | Admitting: Occupational Therapy

## 2014-02-07 LAB — GLUCOSE, CAPILLARY
GLUCOSE-CAPILLARY: 99 mg/dL (ref 70–99)
GLUCOSE-CAPILLARY: 112 mg/dL — AB (ref 70–99)
Glucose-Capillary: 106 mg/dL — ABNORMAL HIGH (ref 70–99)
Glucose-Capillary: 103 mg/dL — ABNORMAL HIGH (ref 70–99)

## 2014-02-07 MED ORDER — PANTOPRAZOLE SODIUM 40 MG PO TBEC
40.0000 mg | DELAYED_RELEASE_TABLET | Freq: Two times a day (BID) | ORAL | Status: DC
  Administered 2014-02-07 – 2014-02-09 (×5): 40 mg via ORAL
  Filled 2014-02-07 (×7): qty 1

## 2014-02-07 MED ORDER — SORBITOL 70 % SOLN
30.0000 mL | Status: AC
Start: 2014-02-07 — End: 2014-02-07
  Administered 2014-02-07: 30 mL via ORAL
  Filled 2014-02-07: qty 30

## 2014-02-07 NOTE — Progress Notes (Signed)
Physical Therapy Session Note  Patient Details  Name: LORENIA HOSTON MRN: [id number] Date of Birth: March 21, 1966  Today's Date: 02/07/2014 PT Individual Time: 1435-1535 PT Individual Time Calculation (min): 60 min     Short Term Goals: Week 3:  PT Short Term Goal 1 (Week 3): STGs = LTGs due to ELOS  Skilled Therapeutic Interventions/Progress Updates:    Pt received supine in bed, agreeable to participate in therapy after max encouragement. Long discussion with pt about discharge plans, expectation for level of care. Informed pt that I am not recommending her to perform slide board transfers with family due to level of skilled assist needed. Pt said her family felt comfortable with the training they had received on using [place] lift and did not have any additional questions/concerns. Pt very concerned about going home in manual wheelchair, will attempt to clarify power w/c status with Cindee Salt from [employer]. Pt rolled L/R w/ MinA to place sling, recommend use of leg loops in bed for pt to set up for roll with increased independence. Pt declined to sit in manual wheelchair today due to discomfort with positioning of back, agreed to try chair tomorrow. Pt transferred bed>power w/c w/ MaxiMove lift. Repositioned hips back in chair with use of tilt function, pt boosting through hands and therapist applying posterior force through femur w/ ModA. Propelled w/c 200' around unit with mod (I). Performed x10 UE exercises shoulder flexion and chest press w/ 2# weight bar, elbow flexion w/ 4# weight bar. Basketball toss/catch for seated balance/trunk control. Pt left seated in w/c in family room with mom present.   Therapy Documentation Precautions:  Precautions Precautions: Fall, Back Precaution Comments: bil leg weakness, paraplegia Restrictions Weight Bearing Restrictions: No Vital Signs: Therapy Vitals Temp: 97.9 F (36.6 C) Temp Source: Oral Pulse Rate: 66 Resp: 18 BP: (!) 137/93 mmHg Patient  Position (if appropriate): Lying Oxygen Therapy SpO2: 96 % O2 Device: Not Delivered Pain: Pain Assessment Pain Score: 3   See FIM for current functional status  Therapy/Group: Individual Therapy  Rada Hay  Rada Hay, PT, DPT 02/07/2014, 7:38 AM

## 2014-02-07 NOTE — Progress Notes (Signed)
Occupational Therapy Session Note  Patient Details  Name: Sarah [name] MRN: [id number] Date of Birth: [birth year]  Today's Date: 02/07/2014 OT Individual Time: 4235-3614 OT Individual Time Calculation (min): 0 min    Short Term Goals: Week 3:  OT Short Term Goal 1 (Week 3): Pt will demo ability to complete slide board transfer from bed to w/c with mod A X1  OT Short Term Goal 2 (Week 3): Pt will demo ability to sustain supported sitting balance at raised mat while performing a functional task for 5 minutes with only supervision for safety. OT Short Term Goal 3 (Week 3): Pt will demo ability to complete 10 min of BUE arm ergometry at Sci-Fit, level 1, with supervision and setup, to improve general endurance OT Short Term Goal 4 (Week 3): Pt will demo ability to complete BUE HEP (7 exercises) using thera-band with setup assist. OT Short Term Goal 5 (Week 3): Pt will demo ability to access refrigerator from [facility] or WC to retrieve beverage using appropriate AE.  Skilled Therapeutic Interventions/Progress Updates: Patient unable to participate in planned treatment d/t nausea w/emesis.    RN and PA alerted to change in status limiting pt's ability to engage in therapy this a.m.     Therapy Documentation Precautions:  Precautions Precautions: Fall, Back Precaution Comments: bil leg weakness, paraplegia Restrictions Weight Bearing Restrictions: No  General: General OT Amount of Missed Time: 60 Minutes Comment: ill with nausea and emesis  Vital Signs: Therapy Vitals Temp: 97.9 F (36.6 C) Temp Source: Oral Pulse Rate: 66 Resp: 18 BP: (!) 137/93 mmHg Patient Position (if appropriate): Lying Oxygen Therapy SpO2: 96 % O2 Device: Not Delivered   Pain: No/denies pain   ADL: ADL ADL Comments: see FIM  See FIM for current functional status  Therapy/Group: Individual Therapy  Tatum Massman 02/07/2014, 8:52 AM

## 2014-02-07 NOTE — Plan of Care (Signed)
Problem: SCI BOWEL ELIMINATION Goal: RH STG MANAGE BOWEL WITH ASSISTANCE STG Manage Bowel with max Assistance.  Outcome: Progressing Goal: RH STG SCI MANAGE BOWEL WITH MEDICATION WITH ASSISTANCE STG SCI Manage bowel with medication with minimal assistance.  Outcome: Progressing Goal: RH STG MANAGE BOWEL W/EQUIPMENT W/ASSISTANCE STG Manage Bowel With Equipment With moderate Assistance  Outcome: Not Progressing Goal: RH STG SCI MANAGE BOWEL PROGRAM W/ASSIST OR AS APPROPRIATE STG SCI Manage bowel program w/ max assist or as appropriate.  Outcome: Not Progressing  Problem: SCI BLADDER ELIMINATION Goal: RH STG MANAGE BLADDER WITH ASSISTANCE STG Manage Bladder With total Assistance (foley in place)  Outcome: Progressing  Problem: RH SKIN INTEGRITY Goal: RH STG SKIN FREE OF INFECTION/BREAKDOWN Skin free of infection/breakdown with moderatel assistance  Outcome: Progressing Goal: RH STG MAINTAIN SKIN INTEGRITY WITH ASSISTANCE STG Maintain Skin Integrity With moderate Assistance.  Outcome: Progressing Goal: RH STG ABLE TO PERFORM INCISION/WOUND CARE W/ASSISTANCE STG Able To Perform Incision/Wound Care With total Assistance of back incision.  Outcome: Not Applicable Date Met:  50/56/97  Problem: RH PAIN MANAGEMENT Goal: RH STG PAIN MANAGED AT OR BELOW PT'S PAIN GOAL Pain less than 3  Outcome: Progressing

## 2014-02-07 NOTE — Plan of Care (Signed)
Problem: SCI BOWEL ELIMINATION Goal: RH STG MANAGE BOWEL WITH ASSISTANCE STG Manage Bowel with max Assistance.  Outcome: Not Progressing Goal: RH STG SCI MANAGE BOWEL WITH MEDICATION WITH ASSISTANCE STG SCI Manage bowel with medication with minimal assistance.  Outcome: Not Progressing Goal: RH STG MANAGE BOWEL W/EQUIPMENT W/ASSISTANCE STG Manage Bowel With Equipment With moderate Assistance  Outcome: Not Progressing  Problem: SCI BLADDER ELIMINATION Goal: RH STG MANAGE BLADDER WITH ASSISTANCE STG Manage Bladder With total Assistance (foley in place)  Outcome: Progressing Goal: RH STG SCI MANAGE BLADDER PROGRAM W/ASSISTANCE Outcome: Progressing  Problem: RH SKIN INTEGRITY Goal: RH STG SKIN FREE OF INFECTION/BREAKDOWN Skin free of infection/breakdown with moderatel assistance  Outcome: Progressing Goal: RH STG MAINTAIN SKIN INTEGRITY WITH ASSISTANCE STG Maintain Skin Integrity With moderate Assistance.  Outcome: Progressing Goal: RH STG ABLE TO PERFORM INCISION/WOUND CARE W/ASSISTANCE STG Able To Perform Incision/Wound Care With total Assistance of back incision.  Outcome: Progressing

## 2014-02-07 NOTE — Progress Notes (Signed)
48 y.o. female with history of HTN, chronic back pain, morbid obesity who was admitted on 12/10/13 with BLE weakness, inability to walk as well as problems with urinary retention and constipation. Work up revealed a lesion at the T3 level of the upper thoracic spine with pathologic compression fracture and epidural tumor resulting in moderate cord compression with evidence of mild spinal cord edema, as well as right suprahilar mass with pulmonary nodules with mediastinal lymph nodes c/w metastatic disease, RUL & RLL PE, tubular cystic masses bilateral adnexal regions and a massive thyroid goiter causing tracheal deviation to the right with narrowing of trachea. She was started on steriods and foley placed for urinary retention. She underwent thoracic laminectomy with excision of tumor on 09/23 by Dr. Christella Noa and on bedrest pending further surgery. Pathology favoring metastatic adenocarcinoma of lung but metastatic thyroid cancer in differential . 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 Buddy Duty consulted for input. 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 Patient underwent T1-T5 posterolateral arthrodesis with allograft on 10/09 with plans XRT to be initiated 2 weeks post op. Dr. Constance Holster consulted for input and patient underwent total thyroidectomy on 10/19 and extubated without difficulty on 10/20.  Subjective/Complaints:   still with difficulty swallowing. Describing it more as GERD now. Worse when she's down. Ate little yesterday. ROS: LE spasticity    Vital Signs: Blood pressure 137/93, pulse 66, temperature 97.9 F (36.6 C), temperature source Oral, resp. rate 18, height 5\' 11"  (1.803 m), weight 142.7 kg (314 lb 9.5 oz), last menstrual period 12/06/2013, SpO2 96 %. No results found. Results for orders placed or performed during the hospital encounter of 01/19/14 (from the past 72 hour(s))   Glucose, capillary     Status: Abnormal   Collection Time: 02/04/14 11:39 AM  Result Value Ref Range   Glucose-Capillary 105 (H) 70 - 99 mg/dL  Glucose, capillary     Status: Abnormal   Collection Time: 02/04/14  4:21 PM  Result Value Ref Range   Glucose-Capillary 102 (H) 70 - 99 mg/dL  Glucose, capillary     Status: Abnormal   Collection Time: 02/04/14  9:11 PM  Result Value Ref Range   Glucose-Capillary 110 (H) 70 - 99 mg/dL  Glucose, capillary     Status: None   Collection Time: 02/05/14  6:38 AM  Result Value Ref Range   Glucose-Capillary 90 70 - 99 mg/dL  Glucose, capillary     Status: Abnormal   Collection Time: 02/05/14 11:58 AM  Result Value Ref Range   Glucose-Capillary 112 (H) 70 - 99 mg/dL   Comment 1 Notify RN   Glucose, capillary     Status: Abnormal   Collection Time: 02/05/14  4:42 PM  Result Value Ref Range   Glucose-Capillary 102 (H) 70 - 99 mg/dL   Comment 1 Notify RN   Glucose, capillary     Status: None   Collection Time: 02/05/14  8:50 PM  Result Value Ref Range   Glucose-Capillary 92 70 - 99 mg/dL  Glucose, capillary     Status: Abnormal   Collection Time: 02/06/14  6:44 AM  Result Value Ref Range   Glucose-Capillary 105 (H) 70 - 99 mg/dL  Glucose, capillary     Status: Abnormal   Collection Time: 02/06/14 11:54 AM  Result Value Ref Range   Glucose-Capillary 120 (H) 70 - 99 mg/dL  Glucose, capillary     Status:  Abnormal   Collection Time: 02/06/14  4:49 PM  Result Value Ref Range   Glucose-Capillary 128 (H) 70 - 99 mg/dL  Glucose, capillary     Status: None   Collection Time: 02/06/14  9:15 PM  Result Value Ref Range   Glucose-Capillary 97 70 - 99 mg/dL  Glucose, capillary     Status: None   Collection Time: 02/07/14  6:59 AM  Result Value Ref Range   Glucose-Capillary 99 70 - 99 mg/dL      Nursing note and vitals reviewed.  Constitutional: She is oriented to person, place, and time. She appears well-developed and well-nourished.  HENT:   Head: Normocephalic and atraumatic.  Eyes: Conjunctivae are normal. Pupils are equal, round, and reactive to light.  Neck: Normal range of motion.  Cardiovascular: Normal rate and regular rhythm.  No murmur heard.  Respiratory: Effort normal and breath sounds normal. No respiratory distress. She has no wheezes. She exhibits no tenderness (sternal area with radiation markings.)  GI: Soft. Bowel sounds are normal. She exhibits no distension. There is no tenderness.  Musculoskeletal: She exhibits no edema and no tenderness.  Neurological: She is alert and oriented to person, place, and time.   Dense paraparesis with flexor spasms and 3 beats clonus on the right.  Diminished sensation 1/2 below lower/mid chest wall.   Skin: Skin is warm and dry.   Psychiatric: She has a normal mood and affect. Her behavior is normal. Judgment and thought content normal.  Motor strength is 5/5 bilateral deltoids, biceps, triceps, grip  0/5 bilateral hip flexors knee extensors ankle dorsiflexor plantar flexor Tone 2/4 left greater than right LE's and fluctuating   Assessment/Plan: 1. Functional deficits secondary to Paraplegia which require 3+ hours per day of interdisciplinary therapy in a comprehensive inpatient rehab setting. Physiatrist is providing close team supervision and 24 hour management of active medical problems listed below. Physiatrist and rehab team continue to assess barriers to discharge/monitor patient progress toward functional and medical goals.    FIM: FIM - Bathing Bathing Steps Patient Completed: Chest, Right Arm, Left Arm, Abdomen Bathing: 2: Max-Patient completes 3-4 44f 10 parts or 25-49%  FIM - Upper Body Dressing/Undressing Upper body dressing/undressing steps patient completed: Thread/unthread right sleeve of pullover shirt/dresss, Thread/unthread left sleeve of pullover shirt/dress, Put head through opening of pull over shirt/dress Upper body dressing/undressing: 0: Wears  gown/pajamas-no public clothing FIM - Lower Body Dressing/Undressing Lower body dressing/undressing: 1: Total-Patient completed less than 25% of tasks  FIM - Toileting Toileting: 1: Total-Patient completed zero steps, helper did all 3  FIM - Air cabin crew Transfers: 0-Activity did not occur  FIM - Control and instrumentation engineer Devices: Sliding board, Bed rails, Arm rests Bed/Chair Transfer: 3: Supine > Sit: Mod A (lifting assist/Pt. 50-74%/lift 2 legs, 3: Sit > Supine: Mod A (lifting assist/Pt. 50-74%/lift 2 legs), 1: Two helpers, 1: Mechanical lift  FIM - Locomotion: Wheelchair Distance: 150 Locomotion: Wheelchair: 6: Travels 150 ft or more, turns around, maneuvers to table, bed or toilet, negotiates 3% grade: maneuvers on rugs and over door sills independently FIM - Locomotion: Ambulation Locomotion: Ambulation: 0: Activity did not occur  Comprehension Comprehension Mode: Auditory Comprehension: 7-Follows complex conversation/direction: With no assist  Expression Expression Mode: Verbal Expression: 7-Expresses complex ideas: With no assist  Social Interaction Social Interaction: 7-Interacts appropriately with others - No medications needed.  Problem Solving Problem Solving: 7-Solves complex problems: Recognizes & self-corrects  Memory Memory: 7-Complete Independence: No helper  Medical Problem List  and Plan:  1. 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   -xrt completed 2. DVT Prophylaxis/Anticoagulation: Pharmaceutical: Xarelto per Hem/Onc input.  3. Pain Management: increase baclofen to better manage spasticity 4. Mood:prn ativan for anxiety,       -pt aware of situational anxiety with therapy--some improvement? 5. Neuropsych: This patient is capable of making decisions on her own behalf.  6. Skin/Wound Care: Air mattress overlay for prevention of break  down. Start educating patient and family on need for pressure relief measures.  7. Fluids/Electrolytes/Nutrition: encourage po intake..  .  8. Neurogenic bladder: foley 9. 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 For RAI at 6-8 weeks post op.  11. HTN: Will monitor every 8 hours. No medications as this time. Monitor for orthostatic changes once OOB.  12   Hyperglycemia: Hgb A1c-6.7.   -sugars reasonable 13. E coli UTI--keflex complete 14. Hemorrhoids: continue proctofoam cre am bid in addition to good rectal hygiene to prevent irritation from stool.  15. Spasticity: baclofen  10mg  TID---continue for now 16. Dysphagia: GERD may be playing a big role. Begin bid protonix  -would like to continue with SLP eval today---pt ok with that  LOS (Days) 19 A FACE TO FACE EVALUATION WAS PERFORMED  Adilenne Ashworth T 02/07/2014, 7:26 AM

## 2014-02-07 NOTE — Progress Notes (Signed)
Physical Therapy Note  Patient Details  Name: TAYLORANN TKACH MRN: [id number] Date of Birth: 1966/03/11 Today's Date: 02/07/2014    Pt reporting significant nausea/vomiting this AM, told therapist that she does not feel well enough to participate with therapy this morning.  RN made aware of pt's status. Pt missed 60 minutes of scheduled PT. Will attempt to make up per POC.  Rada Hay 02/07/2014, 12:02 PM

## 2014-02-07 NOTE — Progress Notes (Signed)
Recreational Therapy Session Note  Patient Details  Name: Sarah [name] MRN: [id number] Date of Birth: 1965-04-09 Today's Date: 02/07/2014  Pain: no c/o Skilled Therapeutic Interventions/Progress Updates: Pt with c/o significant N/V after taking a medication this morning and stated she could not participate in therapy.  RN aware.   [place] 02/07/2014, 4:31 PM

## 2014-02-07 NOTE — Progress Notes (Signed)
SLP Cancellation Note  Patient Details Name: Sarah [name] MRN: [id number] DOB: 1965-10-19   Cancelled evaluation:        SLP attempted to see pt for bedside swallowing evaluation.  Upon arrival, pt was reclined in bed, sleeping, easily awakened to voice.  Pt with complaints of nausea and vomiting all morning and declined any PO trials with SLP.  As a result, pt missed 50 minutes of skilled ST.   SLP was able to complete brief interview with pt related to her dysphagia, which she describes as being related to reflux.  Pt reports limited PO intake due to persistent nausea and vomiting with no excessive coughing or throat clearing with meals.  She reports that swallowing solids improves when she is sitting upright and that when she does experience difficulties swallowing they often resolve with self cued use of extra swallows.  SLP will attempt to complete a thorough bedside swallowing evaluation either later this PM or at next available appointment.                                                                                                Magdaline Zollars, Selinda Orion 02/07/2014, 10:40 AM

## 2014-02-08 ENCOUNTER — Inpatient Hospital Stay (HOSPITAL_COMMUNITY): Payer: Medicaid Other | Admitting: Occupational Therapy

## 2014-02-08 ENCOUNTER — Inpatient Hospital Stay (HOSPITAL_COMMUNITY): Payer: Medicaid Other | Admitting: Speech Pathology

## 2014-02-08 ENCOUNTER — Inpatient Hospital Stay (HOSPITAL_COMMUNITY): Payer: Medicaid Other

## 2014-02-08 ENCOUNTER — Inpatient Hospital Stay (HOSPITAL_COMMUNITY): Payer: Medicaid Other | Admitting: *Deleted

## 2014-02-08 ENCOUNTER — Inpatient Hospital Stay (HOSPITAL_COMMUNITY): Payer: Medicaid Other | Admitting: Physical Therapy

## 2014-02-08 LAB — URINALYSIS, ROUTINE W REFLEX MICROSCOPIC
Glucose, UA: NEGATIVE mg/dL
Ketones, ur: >80 mg/dL — AB
NITRITE: NEGATIVE
PH: 5.5 (ref 5.0–8.0)
Protein, ur: 100 mg/dL — AB
SPECIFIC GRAVITY, URINE: 1.03 (ref 1.005–1.030)
Urobilinogen, UA: 1 mg/dL (ref 0.0–1.0)

## 2014-02-08 LAB — URINE MICROSCOPIC-ADD ON

## 2014-02-08 LAB — GLUCOSE, CAPILLARY
GLUCOSE-CAPILLARY: 113 mg/dL — AB (ref 70–99)
Glucose-Capillary: 87 mg/dL (ref 70–99)
Glucose-Capillary: 117 mg/dL — ABNORMAL HIGH (ref 70–99)
Glucose-Capillary: 113 mg/dL — ABNORMAL HIGH (ref 70–99)

## 2014-02-08 MED ORDER — ENSURE PUDDING PO PUDG: 1.0000 | Freq: Three times a day (TID) | ORAL | Status: DC

## 2014-02-08 MED ORDER — BOOST / RESOURCE BREEZE PO LIQD
1.0000 | Freq: Three times a day (TID) | ORAL | Status: DC
  Administered 2014-02-08: 1 via ORAL

## 2014-02-08 MED ORDER — ENSURE COMPLETE PO LIQD
237.0000 mL | Freq: Three times a day (TID) | ORAL | Status: DC
  Administered 2014-02-09: 237 mL via ORAL

## 2014-02-08 NOTE — Progress Notes (Signed)
48 y.o. female with history of HTN, chronic back pain, morbid obesity who was admitted on 12/10/13 with BLE weakness, inability to walk as well as problems with urinary retention and constipation. Work up revealed a lesion at the T3 level of the upper thoracic spine with pathologic compression fracture and epidural tumor resulting in moderate cord compression with evidence of mild spinal cord edema, as well as right suprahilar mass with pulmonary nodules with mediastinal lymph nodes c/w metastatic disease, RUL & RLL PE, tubular cystic masses bilateral adnexal regions and a massive thyroid goiter causing tracheal deviation to the right with narrowing of trachea. She was started on steriods and foley placed for urinary retention. She underwent thoracic laminectomy with excision of tumor on 09/23 by Dr. Christella Noa and on bedrest pending further surgery. Pathology favoring metastatic adenocarcinoma of lung but metastatic thyroid cancer in differential . 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 Buddy Duty consulted for input. 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 Patient underwent T1-T5 posterolateral arthrodesis with allograft on 10/09 with plans XRT to be initiated 2 weeks post op. Dr. Constance Holster consulted for input and patient underwent total thyroidectomy on 10/19 and extubated without difficulty on 10/20.  Subjective/Complaints:  Got nauseas yesterday after sorbitol. Feeling better today. Ate nothing yesterday.   ROS: LE spasticity    Vital Signs: Blood pressure 110/69, pulse 90, temperature 98.1 F (36.7 C), temperature source Oral, resp. rate 18, height 5\' 11"  (1.803 m), weight 142.7 kg (314 lb 9.5 oz), last menstrual period 12/06/2013, SpO2 97 %. No results found. Results for orders placed or performed during the hospital encounter of 01/19/14 (from the past 72 hour(s))  Glucose, capillary     Status:  Abnormal   Collection Time: 02/05/14 11:58 AM  Result Value Ref Range   Glucose-Capillary 112 (H) 70 - 99 mg/dL   Comment 1 Notify RN   Glucose, capillary     Status: Abnormal   Collection Time: 02/05/14  4:42 PM  Result Value Ref Range   Glucose-Capillary 102 (H) 70 - 99 mg/dL   Comment 1 Notify RN   Glucose, capillary     Status: None   Collection Time: 02/05/14  8:50 PM  Result Value Ref Range   Glucose-Capillary 92 70 - 99 mg/dL  Glucose, capillary     Status: Abnormal   Collection Time: 02/06/14  6:44 AM  Result Value Ref Range   Glucose-Capillary 105 (H) 70 - 99 mg/dL  Glucose, capillary     Status: Abnormal   Collection Time: 02/06/14 11:54 AM  Result Value Ref Range   Glucose-Capillary 120 (H) 70 - 99 mg/dL  Glucose, capillary     Status: Abnormal   Collection Time: 02/06/14  4:49 PM  Result Value Ref Range   Glucose-Capillary 128 (H) 70 - 99 mg/dL  Glucose, capillary     Status: None   Collection Time: 02/06/14  9:15 PM  Result Value Ref Range   Glucose-Capillary 97 70 - 99 mg/dL  Glucose, capillary     Status: None   Collection Time: 02/07/14  6:59 AM  Result Value Ref Range   Glucose-Capillary 99 70 - 99 mg/dL  Glucose, capillary     Status: Abnormal   Collection Time: 02/07/14 11:45 AM  Result Value Ref Range   Glucose-Capillary 112 (H) 70 - 99 mg/dL  Glucose, capillary     Status: Abnormal   Collection Time: 02/07/14  5:58 PM  Result Value Ref Range   Glucose-Capillary 106 (H) 70 - 99 mg/dL  Glucose, capillary     Status: Abnormal   Collection Time: 02/07/14  8:50 PM  Result Value Ref Range   Glucose-Capillary 103 (H) 70 - 99 mg/dL  Glucose, capillary     Status: None   Collection Time: 02/08/14  7:03 AM  Result Value Ref Range   Glucose-Capillary 87 70 - 99 mg/dL      Nursing note and vitals reviewed.  Constitutional: She is oriented to person, place, and time. She appears well-developed and well-nourished.  HENT:  Head: Normocephalic and  atraumatic.  Eyes: Conjunctivae are normal. Pupils are equal, round, and reactive to light.  Neck: Normal range of motion.  Cardiovascular: Normal rate and regular rhythm.  No murmur heard.  Respiratory: Effort normal and breath sounds normal. No respiratory distress. She has no wheezes. She exhibits no tenderness (sternal area with radiation markings.)  GI: Soft. Bowel sounds are normal. She exhibits no distension. There is no tenderness.  Musculoskeletal: She exhibits no edema and no tenderness.  Neurological: She is alert and oriented to person, place, and time.   Dense paraparesis with flexor spasms and 3 beats clonus on the right.  Diminished sensation 1/2 below lower/mid chest wall.   Skin: Skin is warm and dry. No breakdown or redness in gluts/sacral areas. Surgical area well healed.  Psychiatric: She has a normal mood and affect. Her behavior is normal. Judgment and thought content normal.  Motor strength is 5/5 bilateral deltoids, biceps, triceps, grip  0/5 bilateral hip flexors knee extensors ankle dorsiflexor plantar flexor Tone 2/4 left greater than right LE's and fluctuating   Assessment/Plan: 1. Functional deficits secondary to Paraplegia which require 3+ hours per day of interdisciplinary therapy in a comprehensive inpatient rehab setting. Physiatrist is providing close team supervision and 24 hour management of active medical problems listed below. Physiatrist and rehab team continue to assess barriers to discharge/monitor patient progress toward functional and medical goals.    FIM: FIM - Bathing Bathing Steps Patient Completed: Chest, Right Arm, Left Arm, Abdomen Bathing: 2: Max-Patient completes 3-4 73f 10 parts or 25-49%  FIM - Upper Body Dressing/Undressing Upper body dressing/undressing steps patient completed: Thread/unthread right sleeve of pullover shirt/dresss, Thread/unthread left sleeve of pullover shirt/dress, Put head through opening of pull over  shirt/dress Upper body dressing/undressing: 0: Wears gown/pajamas-no public clothing FIM - Lower Body Dressing/Undressing Lower body dressing/undressing: 1: Total-Patient completed less than 25% of tasks  FIM - Toileting Toileting: 1: Total-Patient completed zero steps, helper did all 3  FIM - Air cabin crew Transfers: 0-Activity did not occur  FIM - Control and instrumentation engineer Devices: Sliding board, Bed rails, Arm rests Bed/Chair Transfer: 1: Mechanical lift, 1: Two helpers  FIM - Locomotion: Wheelchair Distance: 150 Locomotion: Wheelchair: 6: Travels 150 ft or more, turns around, maneuvers to table, bed or toilet, negotiates 3% grade: maneuvers on rugs and over door sills independently FIM - Locomotion: Ambulation Locomotion: Ambulation: 0: Activity did not occur  Comprehension Comprehension Mode: Auditory Comprehension: 7-Follows complex conversation/direction: With no assist  Expression Expression Mode: Verbal Expression: 7-Expresses complex ideas: With no assist  Social Interaction Social Interaction: 7-Interacts appropriately with others - No medications needed.  Problem Solving Problem Solving: 7-Solves complex problems: Recognizes & self-corrects  Memory Memory: 7-Complete Independence: No helper  Medical Problem List and Plan:  1. 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   -xrt completed 2. DVT Prophylaxis/Anticoagulation: Pharmaceutical: Xarelto per Hem/Onc input.  3. Pain Management: increase baclofen to better manage spasticity 4. Mood:prn ativan for anxiety,       -pt aware of situational anxiety with therapy--some improvement? 5. Neuropsych: This patient is capable of making decisions on her own behalf.  6. Skin/Wound Care: Air mattress overlay for prevention of break down. Start educating patient and family on need for pressure relief  measures.  7. Fluids/Electrolytes/Nutrition: encourage po intake..  -will ask RD to see for help  -really don't want to do megace given paraparesis and clot risk  .  8. Neurogenic bladder: foley 9. 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 For RAI at 6-8 weeks post op.  11. HTN: Will monitor every 8 hours. No medications as this time. Monitor for orthostatic changes once OOB.  12   Hyperglycemia: Hgb A1c-6.7.   -sugars reasonable 13. E coli UTI--keflex complete 14. Hemorrhoids: continue proctofoam cre am bid in addition to good rectal hygiene to prevent irritation from stool.  15. Spasticity: baclofen  10mg  TID---continue for now 16. Dysphagia: GERD may be playing a big role. continue bid protonix  -SLP to follow up today (could not see yesterday due to nausea)   LOS (Days) 20 A FACE TO FACE EVALUATION WAS PERFORMED  Zahraa Bhargava T 02/08/2014, 7:36 AM

## 2014-02-08 NOTE — Progress Notes (Signed)
NUTRITION FOLLOW UP  DOCUMENTATION CODES Per approved criteria  -Morbid Obesity   INTERVENTION: Provide Ensure Complete po TID, each supplement provides 350 kcal and 13 grams of protein.  Continue Resource Breeze po TID, each supplement provides 250 kcal and 9 grams of protein  Discontinue Ensure Pudding.  Discontinue Magic Cup.   Encourage PO intake.  NUTRITION DIAGNOSIS: Inadequate oral intake related to decreased appetite as evidenced by meal completion of 50-85%; Ongoing  Goal: Pt to meet >/= 90% of their estimated nutrition needs; unmet  Monitor:  PO intake, weight trends, labs, I/O's  48 y.o. 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 Work up revealed a lesion at the T3 level of the upper thoracic spine with pathologic compression fracture and epidural tumor resulting in moderate cord compression with evidence of mild spinal cord edema, as well as right suprahilar mass with pulmonary nodules with mediastinal lymph nodes c/w metastatic disease.  RD consulted for poor po intake. Meal completion has been 0-100%, however reports over the past day she has not eaten anything. Pt has been experiencing nausea and vomiting, however pt reports feeling better today and reports she is going to try to eat. Pt reports she does not like Ensure pudding and would like the Ensure drink instead. Will modify orders. Pt is fine with continuation of Resource Breeze. Pt was educated the importance of adequate calorie and protein intake. Pt was encouraged to eat her food at meals and to drink her supplements.   Height: Ht Readings from Last 1 Encounters:  01/19/14 5\' 11"  (1.803 m)    Weight: Wt Readings from Last 1 Encounters:  02/01/14 314 lb 9.5 oz (142.7 kg)     BMI:  Body mass index is 43.9 kg/(m^2). Morbid obesity  Re-Estimated Nutritional Needs: Kcal: 2100-2400 Protein: 135-150 grams Fluid: 2.1 - 2.4 L/day  Skin: incision on back and neck, +1 LE edema  Diet Order: Diet Carb Modified    Intake/Output Summary (Last 24 hours) at 02/08/14 1202 Last data filed at 02/08/14 1150  Gross per 24 hour  Intake    460 ml  Output    100 ml  Net    360 ml    Last BM: 11/14  Labs:   Recent Labs Lab 02/01/14 1845  NA 136*  K 3.7  CL 98  CO2 27  BUN 8  CREATININE 0.66  CALCIUM 8.4  GLUCOSE 109*    CBG (last 3)   Recent Labs  02/07/14 1758 02/07/14 2050 02/08/14 0703  GLUCAP 106* 103* 87    Scheduled Meds: . baclofen  10 mg Oral TID  . feeding supplement (ENSURE)  1 Container Oral TID WC  . feeding supplement (RESOURCE BREEZE)  1 Container Oral TID BM  . Glycerin (Adult)  1 suppository Rectal QHS  . insulin aspart  0-15 Units Subcutaneous TID WC  . insulin aspart  0-5 Units Subcutaneous QHS  . levothyroxine  250 mcg Oral QAC breakfast  . ondansetron  8 mg Oral Once  . pantoprazole  40 mg Oral BID  . Rivaroxaban  15 mg Oral BID WC  . [START ON 02/10/2014] rivaroxaban  20 mg Oral Daily  . senna-docusate  2 tablet Oral Q0600  . sucralfate  1 g Oral TID WC & HS  Continuous Infusions:   Past Medical History  Diagnosis Date  . Hypertension   . DDD (degenerative disc disease), cervical   . DJD (degenerative joint disease)   . Obesity   . Chronic back pain   . 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 History of rectal fissure   . Rotator cuff tendonitis right    Past Surgical History  Procedure Laterality Date  . Tonsillectomy    . Laminectomy N/A 12/14/2013    Procedure: THORACIC LAMINECTOMY FOR TUMOR THORACIC THREE;  Surgeon: Ashok Pall, MD;  Location: [place] [facility];  Service: Neurosurgery;  Laterality: N/A;  THORACIC LAMINECTOMY FOR TUMOR THORACIC THREE  . Posterior  lumbar fusion 4 level N/A 12/30/2013    Procedure: Thoracic one-Thoracic five posterior thoracic fusion with pedicle screws;  Surgeon: Ashok Pall, MD;  Location: [place] [facility];  Service: Neurosurgery;  Laterality: N/A;  Thoracic one-Thoracic five posterior thoracic fusion with pedicle screws  . Thyroidectomy N/A 01/09/2014    Procedure: TOTAL THYROIDECTOMY;  Surgeon: Izora Gala, MD;  Location: [facility];  Service: ENT;  Laterality: N/A;    Kallie Locks, MS, RD, LDN Pager # [phone] After hours/ weekend pager # [phone]

## 2014-02-08 NOTE — Progress Notes (Signed)
Occupational Therapy Session Note  Patient Details  Name: Sarah [name] MRN: [id number] Date of Birth: 1966/01/23  Today's Date: 02/08/2014 OT Individual Time: 1300-1400 OT Individual Time Calculation (min): 60 min    Short Term Goals: Week 1:  OT Short Term Goal 1 (Week 1): Pt will complete upper body bathing with setup assist OT Short Term Goal 1 - Progress (Week 1): Met OT Short Term Goal 2 (Week 1): Pt will rise from supine to sitting at edge of bed with mod assist to manage LE OT Short Term Goal 2 - Progress (Week 1): Progressing toward goal OT Short Term Goal 3 (Week 1): Pt will maintain supported sitting balance at edge of bed for 5 min during assisted lower body dressing OT Short Term Goal 3 - Progress (Week 1): Progressing toward goal OT Short Term Goal 4 (Week 1): Pt will complete lateral leans to pull up pants with mod assist OT Short Term Goal 4 - Progress (Week 1): Not progressing OT Short Term Goal 5 (Week 1): Pt will complete grooming at w/c level with setup assist OT Short Term Goal 5 - Progress (Week 1): Met  Skilled Therapeutic Interventions/Progress Updates:    Pt worked on brushing her teeth in bed to begin session.  She was able to perform with just setup.  She then worked with nursing tech to [facility] brief and pants as she was not comfortable with female therapist and tech helping her.  Once she finished with donning LB clothing worked on supine to sit EOB using leg straps to assist with self management of her LEs.  She needed mod assist for bending up her knees bilaterally to assist with rolling to the left side.  Max assist for rolling with total assist for moving the LEs off of the bed and transitioning to sitting position.  Performed sliding board transfers to the electric wheelchair and to the therapy mat in the gym with total assist +2 all aspects, including positioning of the board.  In the gym focused session on sitting balance while engaged in UE activity.  She was  able to put together 3-4 PVC pipe puzzles in sitting using bilateral UEs but she kept one stabilized at all times on table or mat.  When attempting to use both simultaneously she demonstrated LOB to the right and posteriorly.  Pt demonstrates static sitting with UE support bilaterally with supervision and mod assist without UE support while engaged in activity.     Therapy Documentation Precautions:  Precautions Precautions: Fall, Back Precaution Comments: bil leg weakness, paraplegia Restrictions Weight Bearing Restrictions: No  Pain: Pain Assessment Pain Assessment: 0-10 Pain Score: 3  Pain Type: Acute pain Pain Location: Back Pain Descriptors / Indicators: Aching Pain Onset: Progressive Pain Intervention(s): Repositioned ADL: See FIM for current functional status  Therapy/Group: Individual Therapy  Kahlan Engebretson OTR/L 02/08/2014, 3:23 PM

## 2014-02-08 NOTE — Progress Notes (Signed)
Chaplain initiated visit with pt. Chaplain and pt discussed nerves about going home. Nurses needed to work with pt so chaplain left at the time.    02/08/14 1600  Clinical Encounter Type  Visited With Patient  Visit Type Follow-up;Spiritual support  Marcelino Scot 02/08/2014 4:16 PM

## 2014-02-08 NOTE — Progress Notes (Signed)
Speech Language Pathology Note  Patient Details  Name: Sarah [name] MRN: [id number] Date of Birth: 04-Sep-1965 Today's Date: 02/08/2014  Cancelled Evaluation: Orders received for a BSE. Upon arrival, patient was supine in bed but was easily awakened to voice.  Patient reported that she is not having "difficulty swallowing" but "acid reflux" due to radiation which is causing intermittent pain with swallowing. Patient also reported she is consuming liquids without difficulty but is not eating due to lack of appetite. Patient reported she did not want to consume any trials for a BSE with SLP at this time because she feels she "does not need it."  Recommend patient initiate a liquid diet if poor PO intake persists to maintain adequate hydration.  PA and physician made aware.  SLP will sign off at this time but please re-consult if needed.     [place], [place] 02/08/2014, 9:23 AM

## 2014-02-08 NOTE — Progress Notes (Signed)
Recreational Therapy Session Note  Patient Details  Name: Sarah [name] MRN: [id number] Date of Birth: 24-Sep-1965 Today's Date: 02/08/2014  Pain: no c/o Skilled Therapeutic Interventions/Progress Updates: Session focused on discharge planning.  Pt stating anxiety about discharge in regards to not having a power w/c at discharge & very few therapy visits.  Emotional support provided.  Discussion included problem solving through home set up, identifying a schedule for herself & pressure relief techniques.  [name] 02/08/2014, 4:19 PM

## 2014-02-08 NOTE — Plan of Care (Signed)
Problem: SCI BLADDER ELIMINATION Goal: RH STG MANAGE BLADDER WITH ASSISTANCE STG Manage Bladder With total Assistance (foley in place)  Outcome: Progressing

## 2014-02-08 NOTE — Progress Notes (Signed)
Physical Therapy Session Note  Patient Details  Name: Sarah [name] MRN: [id number] Date of Birth: 12-23-1965  Today's Date: 02/08/2014 PT Individual Time: 1478-2956 Treatment Session 2: 1510-1610 PT Individual Time Calculation (min): 60 min  Treatment Session 2:60 min  Short Term Goals: Week 3:  PT Short Term Goal 1 (Week 3): STGs = LTGs due to ELOS  Skilled Therapeutic Interventions/Progress Updates:   Pt is very emotionally upset and frustrated during AM visit, explaining that she is frustrated re: not receiving a loaner power w/c and instead having to use a manual w/c loaner, when she is unable to do pressure relieves on her own. PT acknowledges pt's frustration and explains that she will have to limit her time up in manual w/c to 20-30 minutes at a time to prevent pressure relief. Pt explains frustration re: how she will be transported home and PT explained that it will probably be through EMS or the public area transit near her home; pt explains that EMS is preferable due to the fact that they will transfer her from w/c or stretcher to bed. Pt verbalizes frustration over only being given 3 follow up therapy visits after d/c home and PT explains that she needs to have the [place] train the pt's in-home caregiver in doing PROM to pt's B LEs so that she will receive leg stretching daily when the aide is in her home (3 hours per day). PT explains that pt should maximize the use of the in-home aide to reduce caregiver burden by having the aide give the pt her sponge bath, get her dressed, and transfer her to the w/c (only for 20-30 minutes in the loaner, then back into the bed) prior to leaving. PT spent significant amount of time discussing barriers to discharge and anticipated problems once home and pt appeared to calm down by the end of the visit.     Therapeutic Exercise: PT completes PROM to pt's B LEs for tone management: gastrocnemius, hip adductors, hamstrings: 2 x 1 minute  each.  Therapeutic Activity: PT instructs pt in rolling R and L with bedrail req min A to change out soiled incontinance pad.   W/C Management: PT discusses with pt the manual loaner w/c: 32 inches push rim to push rim or 29.5 inches wheel to wheel and instructs pt to ask family if w/c will fit through doorways. Rec therapist wrote down measurements for pt and pt agrees to ask family. PT explains that therapists at the hospital can remove push rims prior to d/c home if it will allow pt's loaner manual w/c to fit through doorframes.   At end of AM session, pt agrees that a home visit is unnecessary because 1) her living room is quite large and there is enough room to easily move the hoyer lift between bed and w/c, 2) family feels competent in completing the hoyer transfer with the patient and even though therapy has offered additional family training session, pt has refused them stating she and her family feel fine with the hoyer transfer, and 3) pt agrees to call home to have width of doorways measured and PT specified that the measurement needs to include the width of the open door and the narrowest part of the door frame.   Treatment Session 2: Therapeutic Activity: PT instructs pt in scoot transfer from power w/c to bed with slideboard and aerobic step under pt's feet. Pt required repeated verbal cues to lean forward and in the appropriate direction for head-hips relationship. 2nd  assist used to stabilize the slideboard for the initial first 2 scoots until pt was firmly on the slideboard - otherwise, pt was 1 person assist scoot transfer with slideboard. PT instructs pt in use of leg loops to position feet during scoot transfer, but pt continues to require assist to manage legs with loops. PT instructs pt in use of leg loops to assist with sit to supine transfer and pt initiates assist with UEs, but continues to req PT mod A for leg management. PT instructs pt in using bed controls and leg loops to  cross feet at the ankles to preset pt up to roll R and L with bedrail, but pt continues to req assist to manage legs even with leg loops and min A in rolling (x 3 reps total) due to pt fatigue.   PT began instructing pt in use of leg loops during functional transfers and bed mobility and pt demonstrates good initial understanding, but was so fatigued that she continued to req assist to manage legs. Pt is extremely close to being a consistent 1 person assist transfer. Continue per PT POC.   Therapy Documentation Precautions:  Precautions Precautions: Fall, Back Precaution Comments: bil leg weakness, paraplegia Restrictions Weight Bearing Restrictions: No Pain: Pain Assessment Pain Assessment: 0-10 Pain Score: 4  Pain Type: Acute pain Pain Location: Vagina Pain Descriptors / Indicators: Aching Pain Onset: Gradual Pain Intervention(s): Emotional support Multiple Pain Sites: No Treatment Session 2: Pt c/o 2/10 pain in low back on lateral R side aching - PT utilizes rest to decrease pt's pain.   See FIM for current functional status  Therapy/Group: Individual Therapy  Abel Hageman M 02/08/2014, 10:07 AM

## 2014-02-08 NOTE — Plan of Care (Signed)
Problem: RH PAIN MANAGEMENT Goal: RH STG PAIN MANAGED AT OR BELOW PT'S PAIN GOAL Pain less than 3  Outcome: Progressing

## 2014-02-08 NOTE — Plan of Care (Signed)
Problem: SCI BOWEL ELIMINATION Goal: RH STG SCI MANAGE BOWEL WITH MEDICATION WITH ASSISTANCE STG SCI Manage bowel with medication with minimal assistance.  Outcome: Not Progressing

## 2014-02-08 NOTE — Plan of Care (Signed)
Problem: SCI BOWEL ELIMINATION Goal: RH STG MANAGE BOWEL W/EQUIPMENT W/ASSISTANCE STG Manage Bowel With Equipment With moderate Assistance  Outcome: Not Progressing

## 2014-02-08 NOTE — Plan of Care (Signed)
Problem: RH SKIN INTEGRITY Goal: RH STG SKIN FREE OF INFECTION/BREAKDOWN Skin free of infection/breakdown with moderatel assistance  Outcome: Progressing Goal: RH STG MAINTAIN SKIN INTEGRITY WITH ASSISTANCE STG Maintain Skin Integrity With moderate Assistance.  Outcome: Progressing

## 2014-02-08 NOTE — Plan of Care (Signed)
Problem: SCI BOWEL ELIMINATION Goal: RH STG MANAGE BOWEL WITH ASSISTANCE STG Manage Bowel with max Assistance.  Outcome: Not Progressing

## 2014-02-08 NOTE — Progress Notes (Signed)
Physical Therapy Session Note  Patient Details  Name: BENNA ARNO MRN: [id number] Date of Birth: 16-Jun-1965  Today's Date: 02/08/2014 PT Individual Time: 0263-7858 PT Individual Time Calculation (min): 30 min   Short Term Goals: Week 3:  PT Short Term Goal 1 (Week 3): STGs = LTGs due to ELOS     Skilled Therapeutic Interventions/Progress Updates:   C/o LBP, unrated. Pt to inform RN at end of session. Therapeutic activities : sitting balance including leaning L,R, forward while using legstraps with assistance.  Pt able to move feet slightly with straps and assistance; limited by clonus. SBT from slightly raised mat, footstool under feet, to L with mod assist of 1 person after SB placed, until she was about 75% into power chair.  +2 to fully position her into power chair, in tilted back position. Pt able to verbally  instruct in foot position and placement of SB.  Pt drove power w/c back to room, supervision. Pt left sitting up, in room with all needs within reach.    Therapy Documentation Precautions:  Precautions Precautions: Fall, Back Precaution Comments: bil leg weakness, paraplegia Restrictions Weight Bearing Restrictions: No     See FIM for current functional status  Therapy/Group: Individual Therapy  Joli Koob 02/08/2014, 4:20 PM

## 2014-02-08 NOTE — Patient Care Conference (Signed)
Inpatient RehabilitationTeam Conference and Plan of Care Update Date: 02/07/2014   Time: 2:05 PM    Patient Name: Sarah [name]      Medical Record Number: [id number]  Date of Birth: 1965/12/30 Sex: Female         Room/Bed: 4M10C/4M10C-01 Payor Info: Payor: MEDICAID [place] / Plan: MEDICAID OF  / Product Type: *No Product type* /    Admitting Diagnosis: t3 incomplete para who receives radioation daily   Admit Date/Time:  01/19/2014  5:29 PM Admission Comments: No comment available   Primary Diagnosis:  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 Principal Problem: 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  Patient Active Problem List   Diagnosis Date Noted  . Dysphagia, pharyngoesophageal phase 02/06/2014  . Type 2 diabetes mellitus without complication 21/19/4174  . Constipation   . Neurogenic bowel   . 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 Metastatic cancer to spine 01/19/2014  . Rotator cuff tendonitis   . Acute postoperative respiratory failure 01/09/2014  . 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 Spine metastasis 12/15/2013  . Leg weakness, bilateral 12/13/2013  . Intractable low back pain 12/10/2013  . Back pain 12/10/2013    Expected Discharge Date: Expected Discharge Date: 02/10/14  Team Members Present: Physician leading conference: Dr. Alger Simons Social Worker Present: Lennart Pall, LCSW Nurse Present: Rayetta Humphrey, RN PT Present: Canary Brim, Lorriane Shire, PT OT Present: [name], OT;Patricia Lissa Hoard, OT SLP Present: Weston Anna, SLP PPS Coordinator present : Daiva Nakayama, RN, CRRN     Current Status/Progress Goal Weekly Team Focus  Medical   dysphagia, ?GERD. spasticity still present. no neuro changes otherwise  improve mobility and activity tolerance  GERd, swallow, pain   Bowel/Bladder   Evening bowel program. LBM 02/04/14. Foley for Retention  Managed bowel program  Need for bulking up stool, and focus on bowel continence    Swallow/Nutrition/ Hydration   BSE Pending  TBD  TBD   ADL's   Setup for UB B&D, Max A for LB B&D, total A for toileting and mech lift transfers  Overall Supervision for upper body ADLs supine or sitting at w/c level, Max - total for toileing/LB B&D  General endurance UE strengthening, AE training, improved ability to direct caregivers, anticipatory awareness   Mobility   Max-TotalA for SBT, min cueing to direct care for transfer.   S for dynamic sitting, bed mobility, TotalA for bed<>chair and car transfers w/ pt directing care  will begin working on manual w/c propulsion, as pt will be going home with manual chair. Emphasis this week on sitting balance, pt directing MaxiMove transfer   Communication             Safety/Cognition/ Behavioral Observations            Pain   Back discomfort, Vicodin x1 q 4hrs PRN  <3 on a 0-10 scale  Assess pain q 4hr and after each PRN medication intervention   Skin   Back incision approximated, OTA, healed appropriately  No additonal skin breakdown  Asess skin q shift    Rehab Goals Patient on target to meet rehab goals: Yes *See Care Plan and progress notes for long and short-term goals.  Barriers to Discharge: profound neuro deficits,     Possible Resolutions to Barriers:  family ed, appropriate equipment    Discharge Planning/Teaching Needs:  home with sister and brother-in-law plus mother providing all needed assistance;  also arranging PCS aide services  Team Discussion:  Now with swallowing issues, ? Reflux.  Little change this week in overall functioning.  Family ed completed and pt does not feel anything further needed.  Ready for d/c end of week.  Revisions to Treatment Plan:  None   Continued Need for Acute Rehabilitation Level of Care: The patient requires daily medical management by a physician with specialized training in physical medicine and rehabilitation for the following conditions: Daily direction of a multidisciplinary  physical rehabilitation program to ensure safe treatment while eliciting the highest outcome that is of practical value to the patient.: Yes Daily medical management of patient stability for increased activity during participation in an intensive rehabilitation regime.: Yes Daily analysis of laboratory values and/or radiology reports with any subsequent need for medication adjustment of medical intervention for : Post surgical problems;Neurological problems  Sarah [name] 02/08/2014, 10:03 AM

## 2014-02-08 NOTE — Progress Notes (Signed)
Social Work Patient ID: Sarah [name], female   DOB: 1965-03-26, 48 y.o.   MRN: [id number]   Met  with pt and mother yesterday afternoon to review team conference and d/c planning needs.  Both report they are feeling ready for d/c end of week but primary concern is that pt have a power w/c to d/c home with.  I am following up [employer] rep about this.  The other home DME is scheduled for delivery to pt's home this afternoon.  Have also made referral for PCS aide in the home.  [facility] to provide HHPT (3 visits only per Medicaid restrictions) and HHRN.  Re: transport home, I have scheduled [place] Medicaid transport van for pick up on Friday morning @ 11:00 and will alert staff that morning as well about these plans.  Continue to follow.  Ogle Hoeffner, LCSW

## 2014-02-09 ENCOUNTER — Inpatient Hospital Stay (HOSPITAL_COMMUNITY): Payer: Medicaid Other | Admitting: Physical Therapy

## 2014-02-09 ENCOUNTER — Inpatient Hospital Stay (HOSPITAL_COMMUNITY): Payer: Medicaid Other

## 2014-02-09 ENCOUNTER — Inpatient Hospital Stay (HOSPITAL_COMMUNITY): Payer: Medicaid Other | Admitting: Occupational Therapy

## 2014-02-09 LAB — CBC WITH DIFFERENTIAL/PLATELET
BASOS ABS: 0 10*3/uL (ref 0.0–0.1)
Basophils Relative: 0 % (ref 0–1)
EOS ABS: 0.2 10*3/uL (ref 0.0–0.7)
EOS PCT: 4 % (ref 0–5)
HCT: 33.1 % — ABNORMAL LOW (ref 36.0–46.0)
Hemoglobin: 11.1 g/dL — ABNORMAL LOW (ref 12.0–15.0)
LYMPHS ABS: 1.1 10*3/uL (ref 0.7–4.0)
Lymphocytes Relative: 24 % (ref 12–46)
MCH: 28.8 pg (ref 26.0–34.0)
MCHC: 33.5 g/dL (ref 30.0–36.0)
MCV: 85.8 fL (ref 78.0–100.0)
Monocytes Absolute: 0.3 10*3/uL (ref 0.1–1.0)
Monocytes Relative: 7 % (ref 3–12)
NEUTROS PCT: 65 % (ref 43–77)
Neutro Abs: 2.9 10*3/uL (ref 1.7–7.7)
PLATELETS: 241 10*3/uL (ref 150–400)
RBC: 3.86 MIL/uL — AB (ref 3.87–5.11)
RDW: 15.4 % (ref 11.5–15.5)
WBC: 4.5 10*3/uL (ref 4.0–10.5)

## 2014-02-09 LAB — BASIC METABOLIC PANEL
Anion gap: 20 — ABNORMAL HIGH (ref 5–15)
BUN: 10 mg/dL (ref 6–23)
CALCIUM: 8.9 mg/dL (ref 8.4–10.5)
CO2: 20 mEq/L (ref 19–32)
Chloride: 96 mEq/L (ref 96–112)
Creatinine, Ser: 0.64 mg/dL (ref 0.50–1.10)
GLUCOSE: 108 mg/dL — AB (ref 70–99)
POTASSIUM: 3.5 meq/L — AB (ref 3.7–5.3)
Sodium: 136 mEq/L — ABNORMAL LOW (ref 137–147)

## 2014-02-09 LAB — GLUCOSE, CAPILLARY
GLUCOSE-CAPILLARY: 108 mg/dL — AB (ref 70–99)
GLUCOSE-CAPILLARY: 120 mg/dL — AB (ref 70–99)
Glucose-Capillary: 111 mg/dL — ABNORMAL HIGH (ref 70–99)
Glucose-Capillary: 93 mg/dL (ref 70–99)

## 2014-02-09 LAB — URINE CULTURE
COLONY COUNT: NO GROWTH
CULTURE: NO GROWTH

## 2014-02-09 MED ORDER — PANTOPRAZOLE SODIUM 40 MG IV SOLR
40.0000 mg | Freq: Two times a day (BID) | INTRAVENOUS | Status: DC
  Administered 2014-02-09 – 2014-02-10 (×3): 40 mg via INTRAVENOUS
  Filled 2014-02-09 (×5): qty 40

## 2014-02-09 NOTE — Progress Notes (Signed)
Patient states that she feels "a little" better but nausea persists. No additional vomiting occurred. Provided patient with ice chips and crackers. Will continue to monitor.   Oval Linsey, RN

## 2014-02-09 NOTE — Plan of Care (Signed)
Problem: RH Balance Goal: LTG: Patient will maintain dynamic sitting balance (OT) LTG: Patient will maintain dynamic sitting balance with assistance during activities of daily living (OT)  Outcome: Completed/Met Date Met:  02/09/14  Problem: RH Grooming Goal: LTG Patient will perform grooming w/assist,cues/equip (OT) LTG: Patient will perform grooming with assist, with/without cues using equipment (OT)  Outcome: Completed/Met Date Met:  02/09/14  Problem: RH Bathing Goal: LTG Patient will bathe with assist, cues/equipment (OT) LTG: Patient will bathe specified number of body parts with assist with/without cues using equipment (position) (OT)  Outcome: Completed/Met Date Met:  02/09/14  Problem: RH Dressing Goal: LTG Patient will perform upper body dressing (OT) LTG Patient will perform upper body dressing with assist, with/without cues (OT).  Outcome: Completed/Met Date Met:  02/09/14 Goal: LTG Patient will perform lower body dressing w/assist (OT) LTG: Patient will perform lower body dressing with assist, with/without cues in positioning using equipment (OT)  Outcome: Completed/Met Date Met:  02/09/14  Problem: RH Toileting Goal: LTG Patient will perform toileting w/assist, cues/equip (OT) LTG: Patient will perform toiletiing (clothes management/hygiene) with assist, with/without cues using equipment (OT)  Outcome: Completed/Met Date Met:  02/09/14  Problem: RH Simple Meal Prep Goal: LTG Patient will perform simple meal prep w/assist (OT) LTG: Patient will perform simple meal prep with assistance, with/without cues (OT).  Outcome: Not Met (add Reason)  Problem: RH Toilet Transfers Goal: LTG Patient will perform toilet transfers w/assist (OT) LTG: Patient will perform toilet transfers with assist, with/without cues using equipment (OT)  Outcome: Completed/Met Date Met:  02/09/14  Problem: RH Tub/Shower Transfers Goal: LTG Patient will perform tub/shower transfers w/assist  (OT) LTG: Patient will perform tub/shower transfers with assist, with/without cues using equipment (OT)  Outcome: Adequate for Discharge

## 2014-02-09 NOTE — Progress Notes (Signed)
Recreational Therapy Discharge Summary Patient Details  Name: Sarah [name] MRN: [id number] Date of Birth: 1965-12-08 Today's Date: 02/09/2014  Long term goals set: 1  Long term goals met: 1  Comments on progress toward goals: Pt has made good progress toward goal and is able to complete simple TR tasks seated in power chair with set up assist- mod I.  More complex tasks requires set up assist and supervision for safety/balance.  Education with pt on use of leisure time post discharge, identifying activities to complete with 2 yo nephew, community reintegration, energy conservation, directing her care, & [employer], [employer].   Reasons for discharge: discharge from hospital    Patient/family agrees with progress made and goals achieved: Yes  [name] 02/09/2014, 5:15 PM

## 2014-02-09 NOTE — Progress Notes (Signed)
Physical Therapy Session Note  Patient Details  Name: Sarah [name] MRN: [id number] Date of Birth: 05-May-1965  Today's Date: 02/09/2014 PT Individual Time: 1105-1205 Treatment Session 2: 1500-1600 PT Individual Time Calculation (min): 60 min  Treatment Session 2: 60 min  Short Term Goals: Week 3:  PT Short Term Goal 1 (Week 3): STGs = LTGs due to ELOS  Skilled Therapeutic Interventions/Progress Updates:    Therapeutic Activity: PT instructs pt in using leg loops and bed controls to cross legs at the ankle level to assist pt with rolling req min A, in rolling L in bed with bedrail req min A, in L side lie to sit with bedrail req mod A for B LE off of bed and assist at the trunk. PT instructs pt in scoot transfer from bed to power w/c, level transfer, with slideboard, bedrail, and armrest, req mod A due to pt's UE weakness, obesity, and fear of falling from leaning fully forward. PT manages pt's legs/foot placement and instructs pt in hand placement and head-hips relationship. PT instructs pt in head-hips for scooting hips back in w/c. All of the transferring out of bed takes 40 minutes.   W/C Management: Pt demonstrates mod I power w/c mobility driving on level surface, up/down a ramp, weaving around obstacles, parallel parking, backing up in a straight line, and negotiating on and off of elevators.   Treatment Session 2: Pt received sitting up in power w/c, tilted back slightly, napping, agreeing to transfer back to bed due to fatigue.   Therapeutic Activity: PT instructs pt in scoot transfer with slideboard from power w/c to bed req mod A for PT assisting with B LE placement and manual assist to complete the transfer. Pt demonstrates improved forward lean/head-hips relationship during scoot transfer and pt completes the entire transfer in only 30 minutes.  PT instructs pt in short sit to long sit transfer in bed with use of leg loops req mod A for B LE assist and lowering the trunk. PT  explained that if pt continued to practice this transfer technique with family guarding her, then she would eventually be able to complete this transfer independently.  Pt scoots up in bed mod I with bed placed in [place], using B arms on headboard to pull body up in bed.   Neuromuscular Reeduation: PT instructs pt in dynamic sitting balance activity: forward propped to backward propped sitting position x 10 reps each with 1 LOB req min A to correct. PT instructs pt in lateral dynamic sitting balance activity: lean onto elbow/forearm and push up x 10 reps each direction req CGA for pt to feel safe.   Pt left with all 4 rails up (per pt request) and all needs in reach. PT spoke with MSW who explains that Cindee Salt from [employer] will be delivering a loaner power w/c directly to pt's home. PT explains to pt that since she only gets 3 follow up visits from a PT due to [employer], she will be responsible to work hard on her HEP for UE strengthening and family or in-home CG will be responsible for working on LE ROM. Pt verbalizes understanding.   Therapy Documentation Precautions:  Precautions Precautions: Fall, Back Precaution Comments: bil leg weakness, paraplegia Restrictions Weight Bearing Restrictions: No  Pain: Pain Assessment Pain Assessment: 0-10 Pain Score: 3  Pain Type: Acute pain Pain Location: Abdomen Pain Descriptors / Indicators: Pressure Pain Onset: Gradual Pain Intervention(s): Emotional support Multiple Pain Sites: Yes 2nd Pain Site Pain Score: 3  Pain Type: Surgical pain Pain Location: Back Pain Orientation: Mid Pain Descriptors / Indicators: Aching Pain Intervention(s): Repositioned Treatment Session 2: Pt c/o 3/10 abdominal and back pain; PT utilizes rest and repositioning to decrease pain.    See FIM for current functional status  Therapy/Group: Individual Therapy  Azani Brogdon M 02/09/2014, 12:42 PM

## 2014-02-09 NOTE — Progress Notes (Signed)
Occupational Therapy Session Note  Patient Details  Name: Sarah [name] MRN: [id number] Date of Birth: Aug 04, 1965  Today's Date: 02/09/2014 OT Individual Time: 1300-1400 OT Individual Time Calculation (min): 60 min    Short Term Goals: Week 3:  OT Short Term Goal 1 (Week 3): Pt will demo ability to complete slide board transfer from bed to w/c with mod A X1  OT Short Term Goal 1 - Progress (Week 3): Met (with extra time for setup (approx 30 min, per P.T.)) OT Short Term Goal 2 (Week 3): Pt will demo ability to sustain supported sitting balance at raised mat while performing a functional task for 5 minutes with only supervision for safety. OT Short Term Goal 2 - Progress (Week 3): Partly met OT Short Term Goal 3 (Week 3): Pt will demo ability to complete 10 min of BUE arm ergometry at Sci-Fit, level 1, with supervision and setup, to improve general endurance OT Short Term Goal 3 - Progress (Week 3): Met OT Short Term Goal 4 (Week 3): Pt will demo ability to complete BUE HEP (7 exercises) using thera-band with setup assist. OT Short Term Goal 4 - Progress (Week 3): Met OT Short Term Goal 5 (Week 3): Pt will demo ability to access refrigerator from [facility] or WC to retrieve beverage using appropriate AE. OT Short Term Goal 5 - Progress (Week 3): Met  Skilled Therapeutic Interventions/Progress Updates:  Pt received supine in bed, complaining of abdominal discomfort and inability to void BM for the last 4 days.   RN present to dispense meds and aware of bowel and bladder routine and related complications.    Pt refused slide board transfer training d/t previous session having completed task already.   Pt then reported that her HEP for BUE strengthening was missing and she asked therapist for review and replacement of HEP literature.   Pt completed therapeutic exercise (15 min) with focus on BUE strengthening and general endurance using Sci-Fit (UE ergometry) and thera-band for seated BUE strengthening.    Pt required only setup assist to position at Sci-Fit and she sustained effort for 5 min warm-up.   Pt completed BUE strengthening using written HEP (duplicate provided this date d/t pt reporting she lost original material).   Pt required only setup assist to affix band to bed rail and foot in order to perform exercise.    Therapeutic Activity (45 min) with focus on re-ed on use of leg loops, pt ed on AT resources (ncATp literature resources), discharge planning, and attempted education on improved awareness of symptoms of autonomic dysreflexia (AD).   Pt refused to engage in discussion of AD although stating she has had some training on recognizing symptoms.   OT provided pocket medical alert pamphlet.    OT attempted re-ed on managing PWC and improving functional independence with meal prep however pt denied she would ever be left alone and was not interested in meal prep but performed beverage retrieval from refrigerator using reacher.         Therapy Documentation Precautions:  Precautions Precautions: Fall, Back Precaution Comments: bil leg weakness, paraplegia Restrictions Weight Bearing Restrictions: No  Pain: Pain Assessment Pain Assessment: 0-10 Pain Score: 3  Pain Type: Acute pain Pain Location: Abdomen Pain Descriptors / Indicators: Pressure Pain Onset: Gradual Pain Intervention(s): Emotional support Multiple Pain Sites: Yes 2nd Pain Site Pain Score: 3 Pain Type: Surgical pain Pain Location: Back Pain Orientation: Mid Pain Descriptors / Indicators: Aching Pain Intervention(s): Repositioned  ADL: ADL ADL  Comments: see FIM   See FIM for current functional status  Therapy/Group: Individual Therapy  [place] 02/09/2014, 2:20 PM

## 2014-02-09 NOTE — Progress Notes (Signed)
Patient reports nausea, denies feeling urge to vomit. Provided the patient with PRN phenergan suppository/ Will continue to monitor.  Oval Linsey, RN

## 2014-02-09 NOTE — Plan of Care (Signed)
Problem: SCI BOWEL ELIMINATION Goal: RH STG MANAGE BOWEL WITH ASSISTANCE STG Manage Bowel with max Assistance.  Outcome: Not Progressing Goal: RH STG SCI MANAGE BOWEL WITH MEDICATION WITH ASSISTANCE STG SCI Manage bowel with medication with minimal assistance.  Outcome: Not Progressing Goal: RH STG MANAGE BOWEL W/EQUIPMENT W/ASSISTANCE STG Manage Bowel With Equipment With moderate Assistance  Outcome: Not Progressing Goal: RH STG SCI MANAGE BOWEL PROGRAM W/ASSIST OR AS APPROPRIATE STG SCI Manage bowel program w/ max assist or as appropriate.  Outcome: Not Progressing  Problem: SCI BLADDER ELIMINATION Goal: RH STG MANAGE BLADDER WITH ASSISTANCE STG Manage Bladder With total Assistance (foley in place)  Outcome: Progressing Goal: RH STG SCI MANAGE BLADDER PROGRAM W/ASSISTANCE Outcome: Progressing  Problem: RH SKIN INTEGRITY Goal: RH STG SKIN FREE OF INFECTION/BREAKDOWN Skin free of infection/breakdown with moderatel assistance  Outcome: Progressing Goal: RH STG MAINTAIN SKIN INTEGRITY WITH ASSISTANCE STG Maintain Skin Integrity With moderate Assistance.  Outcome: Progressing  Problem: RH PAIN MANAGEMENT Goal: RH STG PAIN MANAGED AT OR BELOW PT'S PAIN GOAL Pain less than 3  Outcome: Progressing With PRN medication.

## 2014-02-09 NOTE — Progress Notes (Addendum)
Nursing expressing concerns about patient's po intake. Only eating bites and needs encouragement to take supplements. Discussed diet with patient. Patient with radiation esophagitis and recommended liquid diet for now--room temperature to help with tolerance/symptoms. Patient denies throat pain but reports "I can't eat" and complains of diffuse abdominal pain.  Abdominal discomfort appears to be due to dysesthesias.  Ordered IV protonix to help with GERD symptoms but patient declining this as she is looking forward to going home tomorrow.  Educated that this will be more effective in symptom management and facilitation of discharge to home. SWill check lytes to monitor hydration status as well as KUB as has not had BM in few day.

## 2014-02-09 NOTE — Progress Notes (Signed)
Patient had x 2 episode of vomitting, administered PRN phenergan suppository.   Will continue to monitor.   Oval Linsey, RN

## 2014-02-09 NOTE — Progress Notes (Signed)
Occupational Therapy Session Note  Patient Details  Name: Sarah [name] MRN: [id number] Date of Birth: November 29, 1965  Today's Date: 02/09/2014 OT Individual Time: 0900-1000 OT Individual Time Calculation (min): 60 min   Short Term Goals: Week 3:  OT Short Term Goal 1 (Week 3): Pt will demo ability to complete slide board transfer from bed to w/c with mod A X1  OT Short Term Goal 1 - Progress (Week 3): Met (with extra time for setup (approx 30 min, per P.T.)) OT Short Term Goal 2 (Week 3): Pt will demo ability to sustain supported sitting balance at raised mat while performing a functional task for 5 minutes with only supervision for safety. OT Short Term Goal 2 - Progress (Week 3): Partly met OT Short Term Goal 3 (Week 3): Pt will demo ability to complete 10 min of BUE arm ergometry at Sci-Fit, level 1, with supervision and setup, to improve general endurance OT Short Term Goal 3 - Progress (Week 3): Met OT Short Term Goal 4 (Week 3): Pt will demo ability to complete BUE HEP (7 exercises) using thera-band with setup assist. OT Short Term Goal 4 - Progress (Week 3): Met OT Short Term Goal 5 (Week 3): Pt will demo ability to access refrigerator from [facility] or WC to retrieve beverage using appropriate AE. OT Short Term Goal 5 - Progress (Week 3): Met  Skilled Therapeutic Interventions/Progress Updates:  Patient sleeping in bed upon arrival.  Engaged in self care retraining to include sponge bath, dress and groom at bed level.  Focused session on activity tolerance, guiding caregiver when providing assistance and use of AE to improve independence.  Patient's PA present near beginning of session to discuss nausea and loss of appetite.  Patient participated in self care session however did not appear to be vested in the task.  Patient required mod vcs to guide this OT regarding when and how to provide care and she stated, "I will have a CNA bathing and dressing me everyday once I go home".  Patient often  asking for assistance before attempting the task first.  Patient was able to bathe upper legs using LH sponge once caregiver assists with moving/positioning leg.  Patient performed grooming tasks at bed level.  Therapy Documentation Precautions:  Precautions Precautions: Fall, Back Precaution Comments: bil leg weakness, paraplegia Restrictions Weight Bearing Restrictions: No Pain: Reports back pain, not rated, repositioned and rest. ADL: See FIM for current functional status  Therapy/Group: Individual Therapy  Arnoldo Hildreth 02/09/2014, 3:53 PM

## 2014-02-09 NOTE — Plan of Care (Signed)
Problem: SCI BLADDER ELIMINATION Goal: RH STG MANAGE BLADDER WITH ASSISTANCE STG Manage Bladder With total Assistance (foley in place)  Outcome: Progressing

## 2014-02-09 NOTE — Progress Notes (Signed)
48 y.o. female with history of HTN, chronic back pain, morbid obesity who was admitted on 12/10/13 with BLE weakness, inability to walk as well as problems with urinary retention and constipation. Work up revealed a lesion at the T3 level of the upper thoracic spine with pathologic compression fracture and epidural tumor resulting in moderate cord compression with evidence of mild spinal cord edema, as well as right suprahilar mass with pulmonary nodules with mediastinal lymph nodes c/w metastatic disease, RUL & RLL PE, tubular cystic masses bilateral adnexal regions and a massive thyroid goiter causing tracheal deviation to the right with narrowing of trachea. She was started on steriods and foley placed for urinary retention. She underwent thoracic laminectomy with excision of tumor on 09/23 by Dr. Christella Noa and on bedrest pending further surgery. Pathology favoring metastatic adenocarcinoma of lung but metastatic thyroid cancer in differential . 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 Buddy Duty consulted for input. 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 Patient underwent T1-T5 posterolateral arthrodesis with allograft on 10/09 with plans XRT to be initiated 2 weeks post op. Dr. Constance Holster consulted for input and patient underwent total thyroidectomy on 10/19 and extubated without difficulty on 10/20.  Subjective/Complaints:  Still eating little. Still has burning in throat.  ROS: LE spasticity    Vital Signs: Blood pressure 136/90, pulse 112, temperature 97.9 F (36.6 C), temperature source Oral, resp. rate 18, height 5\' 11"  (1.803 m), weight 142.7 kg (314 lb 9.5 oz), last menstrual period 12/06/2013, SpO2 98 %. No results found. Results for orders placed or performed during the hospital encounter of 01/19/14 (from the past 72 hour(s))  Glucose, capillary     Status: Abnormal   Collection Time:  02/06/14 11:54 AM  Result Value Ref Range   Glucose-Capillary 120 (H) 70 - 99 mg/dL  Glucose, capillary     Status: Abnormal   Collection Time: 02/06/14  4:49 PM  Result Value Ref Range   Glucose-Capillary 128 (H) 70 - 99 mg/dL  Glucose, capillary     Status: None   Collection Time: 02/06/14  9:15 PM  Result Value Ref Range   Glucose-Capillary 97 70 - 99 mg/dL  Glucose, capillary     Status: None   Collection Time: 02/07/14  6:59 AM  Result Value Ref Range   Glucose-Capillary 99 70 - 99 mg/dL  Glucose, capillary     Status: Abnormal   Collection Time: 02/07/14 11:45 AM  Result Value Ref Range   Glucose-Capillary 112 (H) 70 - 99 mg/dL  Glucose, capillary     Status: Abnormal   Collection Time: 02/07/14  5:58 PM  Result Value Ref Range   Glucose-Capillary 106 (H) 70 - 99 mg/dL  Glucose, capillary     Status: Abnormal   Collection Time: 02/07/14  8:50 PM  Result Value Ref Range   Glucose-Capillary 103 (H) 70 - 99 mg/dL  Glucose, capillary     Status: None   Collection Time: 02/08/14  7:03 AM  Result Value Ref Range   Glucose-Capillary 87 70 - 99 mg/dL  Glucose, capillary     Status: Abnormal   Collection Time: 02/08/14 12:43 PM  Result Value Ref Range   Glucose-Capillary 117 (H) 70 - 99 mg/dL  Urinalysis, Routine w reflex microscopic     Status: Abnormal   Collection Time: 02/08/14  4:28 PM  Result Value Ref Range   Color, Urine AMBER (A) YELLOW  Comment: BIOCHEMICALS MAY BE AFFECTED BY COLOR   APPearance CLOUDY (A) CLEAR   Specific Gravity, Urine 1.030 1.005 - 1.030   pH 5.5 5.0 - 8.0   Glucose, UA NEGATIVE NEGATIVE mg/dL   Hgb urine dipstick LARGE (A) NEGATIVE   Bilirubin Urine LARGE (A) NEGATIVE   Ketones, ur >80 (A) NEGATIVE mg/dL   Protein, ur 100 (A) NEGATIVE mg/dL   Urobilinogen, UA 1.0 0.0 - 1.0 mg/dL   Nitrite NEGATIVE NEGATIVE   Leukocytes, UA MODERATE (A) NEGATIVE  Urine microscopic-add on     Status: Abnormal   Collection Time: 02/08/14  4:28 PM  Result  Value Ref Range   Squamous Epithelial / LPF RARE RARE   WBC, UA 11-20 <3 WBC/hpf   RBC / HPF 11-20 <3 RBC/hpf   Bacteria, UA FEW (A) RARE   Urine-Other MUCOUS PRESENT   Glucose, capillary     Status: Abnormal   Collection Time: 02/08/14  4:35 PM  Result Value Ref Range   Glucose-Capillary 113 (H) 70 - 99 mg/dL  Glucose, capillary     Status: Abnormal   Collection Time: 02/08/14  9:07 PM  Result Value Ref Range   Glucose-Capillary 113 (H) 70 - 99 mg/dL  Glucose, capillary     Status: Abnormal   Collection Time: 02/09/14  6:50 AM  Result Value Ref Range   Glucose-Capillary 111 (H) 70 - 99 mg/dL      Nursing note and vitals reviewed.  Constitutional: She is oriented to person, place, and time. She appears well-developed and well-nourished.  HENT:  Head: Normocephalic and atraumatic.  Eyes: Conjunctivae are normal. Pupils are equal, round, and reactive to light.  Neck: Normal range of motion.  Cardiovascular: Normal rate and regular rhythm.  No murmur heard.  Respiratory: Effort normal and breath sounds normal. No respiratory distress. She has no wheezes. She exhibits no tenderness (sternal area with radiation markings.)  GI: Soft. Bowel sounds are normal. She exhibits no distension. There is no tenderness.  Musculoskeletal: She exhibits no edema and no tenderness.  Neurological: She is alert and oriented to person, place, and time.   Dense paraparesis with flexor spasms and 3 beats clonus on the right.  Diminished sensation 1/2 below lower/mid chest wall.   Skin: Skin is warm and dry. No breakdown or redness in gluts/sacral areas. Surgical area well healed.  Psychiatric: remains quiet/flat.  Motor strength is 5/5 bilateral deltoids, biceps, triceps, grip  0/5 bilateral hip flexors knee extensors ankle dorsiflexor plantar flexor Tone 2/4 left greater than right LE's and fluctuating   Assessment/Plan: 1. Functional deficits secondary to Paraplegia which require 3+ hours per day  of interdisciplinary therapy in a comprehensive inpatient rehab setting. Physiatrist is providing close team supervision and 24 hour management of active medical problems listed below. Physiatrist and rehab team continue to assess barriers to discharge/monitor patient progress toward functional and medical goals.    FIM: FIM - Bathing Bathing Steps Patient Completed: Chest, Right Arm, Left Arm, Abdomen Bathing: 2: Max-Patient completes 3-4 58f 10 parts or 25-49%  FIM - Upper Body Dressing/Undressing Upper body dressing/undressing steps patient completed: Thread/unthread right sleeve of pullover shirt/dresss, Thread/unthread left sleeve of pullover shirt/dress, Put head through opening of pull over shirt/dress Upper body dressing/undressing: 0: Wears gown/pajamas-no public clothing FIM - Lower Body Dressing/Undressing Lower body dressing/undressing: 1: Total-Patient completed less than 25% of tasks  FIM - Toileting Toileting: 1: Total-Patient completed zero steps, helper did all 3  FIM - Air cabin crew Transfers:  0-Activity did not occur  FIM - Control and instrumentation engineer Devices: Sliding board, Arm rests Bed/Chair Transfer: 1: Two helpers  FIM - Locomotion: Wheelchair Distance: 150 Locomotion: Wheelchair: 0: Activity did not occur FIM - Locomotion: Ambulation Locomotion: Ambulation: 0: Activity did not occur  Comprehension Comprehension Mode: Auditory Comprehension: 6-Follows complex conversation/direction: With extra time/assistive device  Expression Expression Mode: Verbal Expression: 6-Expresses complex ideas: With extra time/assistive device  Social Interaction Social Interaction: 6-Interacts appropriately with others with medication or extra time (anti-anxiety, antidepressant).  Problem Solving Problem Solving: 6-Solves complex problems: With extra time  Memory Memory: 6-More than reasonable amt of time  Medical Problem List and Plan:   1. 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   -xrt completed 2. DVT Prophylaxis/Anticoagulation: Pharmaceutical: Xarelto per Hem/Onc input.  3. Pain Management: increase baclofen to better manage spasticity 4. Mood:prn ativan for anxiety,       -pt aware of situational anxiety with therapy-- 5. Neuropsych: This patient is capable of making decisions on her own behalf.  6. Skin/Wound Care: Air mattress overlay for prevention of break down. Start educating patient and family on need for pressure relief measures.  7. Fluids/Electrolytes/Nutrition: encourage po intake..  -still eating little. Is taking some fluids  -recheck lytes today  -reinforced importance of PO if she wants to go home. 8. Neurogenic bladder: foley 9. 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 For RAI at 6-8 weeks post op.  11. HTN: Will monitor every 8 hours. No medications as this time. Monitor for orthostatic changes once OOB.  12   Hyperglycemia: Hgb A1c-6.7.   -sugars reasonable 13. E coli UTI--keflex complete 14. Hemorrhoids: continue proctofoam cre am bid in addition to good rectal hygiene to prevent irritation from stool.  15. Spasticity: baclofen  10mg  TID---continue for now 16. Dysphagia: GERD/XRT   -carafate, protonix   LOS (Days) 21 A FACE TO FACE EVALUATION WAS PERFORMED  Paco Cislo T 02/09/2014, 10:51 AM

## 2014-02-09 NOTE — Plan of Care (Signed)
Problem: RH Leisure Awareness Goal: LTG: Patient will participate in leisure activities (TR) LTG: Patient will participate in leisure activities (simple/moderate/difficult) to increase ability to functionally perform activity, identify and utilize resources, identify new leisure interests, utilize adaptive equipment at specific level (TR)  Outcome: Completed/Met Date Met:  02/09/14     

## 2014-02-10 ENCOUNTER — Inpatient Hospital Stay: Payer: Medicaid Other | Admitting: Family Medicine

## 2014-02-10 DIAGNOSIS — K5900 Constipation, unspecified: Secondary | ICD-10-CM

## 2014-02-10 LAB — GLUCOSE, CAPILLARY: Glucose-Capillary: 115 mg/dL — ABNORMAL HIGH (ref 70–99)

## 2014-02-10 MED ORDER — PROMETHAZINE HCL 25 MG PO TABS: 25.0000 mg | ORAL_TABLET | Freq: Four times a day (QID) | ORAL | Status: DC | PRN

## 2014-02-10 MED ORDER — PANTOPRAZOLE SODIUM 40 MG IV SOLR: 40.0000 mg | Freq: Two times a day (BID) | INTRAVENOUS | Status: DC

## 2014-02-10 MED ORDER — LORAZEPAM 0.5 MG PO TABS: 0.5000 mg | ORAL_TABLET | Freq: Two times a day (BID) | ORAL | Status: DC | PRN

## 2014-02-10 MED ORDER — SUCRALFATE 1 GM/10ML PO SUSP: 1.0000 g | Freq: Three times a day (TID) | ORAL | Status: DC

## 2014-02-10 MED ORDER — LEVOTHYROXINE SODIUM 125 MCG PO TABS: 250.0000 ug | ORAL_TABLET | Freq: Every day | ORAL | Status: DC

## 2014-02-10 MED ORDER — ACETAMINOPHEN 325 MG PO TABS: 650.0000 mg | ORAL_TABLET | ORAL | Status: DC | PRN

## 2014-02-10 MED ORDER — POLYETHYLENE GLYCOL 3350 17 G PO PACK: 17.0000 g | PACK | Freq: Every day | ORAL | Status: DC

## 2014-02-10 MED ORDER — GLYCERIN (LAXATIVE) 2.1 G RE SUPP: 1.0000 | Freq: Every day | RECTAL | Status: DC

## 2014-02-10 MED ORDER — HYDROCODONE-ACETAMINOPHEN 5-325 MG PO TABS
1.0000 | ORAL_TABLET | Freq: Four times a day (QID) | ORAL | Status: DC | PRN
Start: 2014-02-10 — End: 2014-05-26

## 2014-02-10 MED ORDER — SENNA 8.6 MG PO TABS: 2.0000 | ORAL_TABLET | Freq: Every day | ORAL | Status: DC

## 2014-02-10 MED ORDER — BACLOFEN 10 MG PO TABS: 10.0000 mg | ORAL_TABLET | Freq: Three times a day (TID) | ORAL | Status: DC

## 2014-02-10 MED ORDER — RIVAROXABAN 20 MG PO TABS: 20.0000 mg | ORAL_TABLET | Freq: Every day | ORAL | Status: DC

## 2014-02-10 MED ORDER — POLYETHYLENE GLYCOL 3350 17 G PO PACK
17.0000 g | PACK | Freq: Every day | ORAL | Status: DC
  Administered 2014-02-10: 17 g via ORAL
  Filled 2014-02-10 (×2): qty 1

## 2014-02-10 NOTE — Plan of Care (Signed)
Problem: RH Balance Goal: LTG Patient will maintain dynamic sitting balance (PT) LTG: Patient will maintain dynamic sitting balance with assistance during mobility activities (PT)  Outcome: Not Met (add Reason) Pt demonstrates LOB during anterior/posterior sitting balance activity on bed during final day of PT, req min A to correct.   Problem: RH Bed Mobility Goal: LTG Patient will perform bed mobility with assist (PT) LTG: Patient will perform bed mobility with assistance, with/without cues (PT).  Outcome: Not Met (add Reason) Pt req up to mod A for supine to/from sit for B LE management.   Problem: RH Bed to Chair Transfers Goal: LTG Patient will perform bed/chair transfers w/assist (PT) LTG: Patient will perform bed/chair transfers with assistance, with/without cues (PT).  Outcome: Completed/Met Date Met:  02/10/14  Problem: RH Car Transfers Goal: LTG Patient will perform car transfers with assist (PT) LTG: Patient will perform car transfers with assistance (PT).  Outcome: Completed/Met Date Met:  02/10/14  Problem: RH Wheelchair Mobility Goal: LTG Patient will propel w/c in controlled environment (PT) LTG: Patient will propel wheelchair in controlled environment, # of feet with assist (PT)  Outcome: Completed/Met Date Met:  02/10/14 Goal: LTG Patient will propel w/c in home environment (PT) LTG: Patient will propel wheelchair in home environment, # of feet with assistance (PT).  Outcome: Completed/Met Date Met:  02/10/14 Goal: LTG Patient will propel w/c in community environment (PT) LTG: Patient will propel wheelchair in community environment, # of feet with assist (PT)  Outcome: Completed/Met Date Met:  02/10/14

## 2014-02-10 NOTE — Progress Notes (Signed)
Pt received discharge instructions from Reesa Chew, PA-C with verbal understanding. Pt discharged to home via EMS with belongings.

## 2014-02-10 NOTE — Progress Notes (Signed)
Social Work  Discharge Note  The overall goal for the admission was met for:   Discharge location: Yes - home with sister and mother providing 24/7 care  Length of Stay: Yes - 22 days  Discharge activity level: Yes- total assist w/c level  Home/community participation: Yes  Services provided included: MD, RD, PT, OT, SLP, RN, TR, Pharmacy, Neuropsych and SW  Financial Services: Medicaid  Follow-up services arranged: Home Health: PT, Therapist, sports, SW via [facility], DME: power w/c via [employer];  hoyer lift and hospital bed via [facility];  referred to [facility] for mail delivery incontinent supplies, Other: Provided info on Independent Living for home modifications assist if needed;  Provided info on current support groups at [facility] and Patient/Family has no preference for HH/DME agencies  Comments (or additional information):  Transport home via [facility]  Patient/Family verbalized understanding of follow-up arrangements: Yes  Individual responsible for coordination of the follow-up plan: pt  Confirmed correct DME delivered: Jarmon Javid 02/10/2014    Jadesola Poynter

## 2014-02-10 NOTE — Progress Notes (Signed)
48 y.o. female with history of HTN, chronic back pain, morbid obesity who was admitted on 12/10/13 with BLE weakness, inability to walk as well as problems with urinary retention and constipation. Work up revealed a lesion at the T3 level of the upper thoracic spine with pathologic compression fracture and epidural tumor resulting in moderate cord compression with evidence of mild spinal cord edema, as well as right suprahilar mass with pulmonary nodules with mediastinal lymph nodes c/w metastatic disease, RUL & RLL PE, tubular cystic masses bilateral adnexal regions and a massive thyroid goiter causing tracheal deviation to the right with narrowing of trachea. She was started on steriods and foley placed for urinary retention. She underwent thoracic laminectomy with excision of tumor on 09/23 by Dr. Christella Noa and on bedrest pending further surgery. Pathology favoring metastatic adenocarcinoma of lung but metastatic thyroid cancer in differential . 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 Buddy Duty consulted for input. 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 Patient underwent T1-T5 posterolateral arthrodesis with allograft on 10/09 with plans XRT to be initiated 2 weeks post op. Dr. Constance Holster consulted for input and patient underwent total thyroidectomy on 10/19 and extubated without difficulty on 10/20.  Subjective/Complaints:  No changes. Has "funny feeling" in belly.   ROS: LE spasticity    Vital Signs: Blood pressure 126/82, pulse 98, temperature 98.6 F (37 C), temperature source Oral, resp. rate 20, height _0  (1.803 m), weight 142.7 kg (314 lb 9.5 oz), last menstrual period 12/06/2013, SpO2 98 %. No results found. Results for orders placed or performed during the hospital encounter of 01/19/14 (from the past 72 hour(s))  Glucose, capillary     Status: Abnormal   Collection Time: 02/07/14 11:45  AM  Result Value Ref Range   Glucose-Capillary 112 (H) 70 - 99 mg/dL  Glucose, capillary     Status: Abnormal   Collection Time: 02/07/14  5:58 PM  Result Value Ref Range   Glucose-Capillary 106 (H) 70 - 99 mg/dL  Glucose, capillary     Status: Abnormal   Collection Time: 02/07/14  8:50 PM  Result Value Ref Range   Glucose-Capillary 103 (H) 70 - 99 mg/dL  Glucose, capillary     Status: None   Collection Time: 02/08/14  7:03 AM  Result Value Ref Range   Glucose-Capillary 87 70 - 99 mg/dL  Glucose, capillary     Status: Abnormal   Collection Time: 02/08/14 12:43 PM  Result Value Ref Range   Glucose-Capillary 117 (H) 70 - 99 mg/dL  Urinalysis, Routine w reflex microscopic     Status: Abnormal   Collection Time: 02/08/14  4:28 PM  Result Value Ref Range   Color, Urine AMBER (A) YELLOW    Comment: BIOCHEMICALS MAY BE AFFECTED BY COLOR   APPearance CLOUDY (A) CLEAR   Specific Gravity, Urine 1.030 1.005 - 1.030   pH 5.5 5.0 - 8.0   Glucose, UA NEGATIVE NEGATIVE mg/dL   Hgb urine dipstick LARGE (A) NEGATIVE   Bilirubin Urine LARGE (A) NEGATIVE   Ketones, ur >80 (A) NEGATIVE mg/dL   Protein, ur 100 (A) NEGATIVE mg/dL   Urobilinogen, UA 1.0 0.0 - 1.0 mg/dL   Nitrite NEGATIVE NEGATIVE   Leukocytes, UA MODERATE (A) NEGATIVE  Culture, Urine     Status: None   Collection Time: 02/08/14  4:28 PM  Result Value Ref Range   Specimen Description URINE, CATHETERIZED  Special Requests NONE    Culture  Setup Time      02/08/2014 23:05 Performed at [place] Performed at [facility]     Culture NO GROWTH Performed at [facility]     Report Status 02/09/2014 FINAL   Urine microscopic-add on     Status: Abnormal   Collection Time: 02/08/14  4:28 PM  Result Value Ref Range   Squamous Epithelial / LPF RARE RARE   WBC, UA 11-20 <3 WBC/hpf   RBC / HPF 11-20 <3 RBC/hpf   Bacteria, UA FEW (A) RARE   Urine-Other MUCOUS PRESENT    Glucose, capillary     Status: Abnormal   Collection Time: 02/08/14  4:35 PM  Result Value Ref Range   Glucose-Capillary 113 (H) 70 - 99 mg/dL  Glucose, capillary     Status: Abnormal   Collection Time: 02/08/14  9:07 PM  Result Value Ref Range   Glucose-Capillary 113 (H) 70 - 99 mg/dL  Glucose, capillary     Status: Abnormal   Collection Time: 02/09/14  6:50 AM  Result Value Ref Range   Glucose-Capillary 111 (H) 70 - 99 mg/dL  Glucose, capillary     Status: Abnormal   Collection Time: 02/09/14 11:39 AM  Result Value Ref Range   Glucose-Capillary 108 (H) 70 - 99 mg/dL  Basic metabolic panel     Status: Abnormal   Collection Time: 02/09/14 12:40 PM  Result Value Ref Range   Sodium 136 (L) 137 - 147 mEq/L   Potassium 3.5 (L) 3.7 - 5.3 mEq/L   Chloride 96 96 - 112 mEq/L   CO2 20 19 - 32 mEq/L   Glucose, Bld 108 (H) 70 - 99 mg/dL   BUN 10 6 - 23 mg/dL   Creatinine, Ser 0.64 0.50 - 1.10 mg/dL   Calcium 8.9 8.4 - 10.5 mg/dL   GFR calc non Af Amer >90 >90 mL/min   GFR calc Af Amer >90 >90 mL/min    Comment: (NOTE) The eGFR has been calculated using the CKD EPI equation. This calculation has not been validated in all clinical situations. eGFR's persistently <90 mL/min signify possible Chronic Kidney Disease.    Anion gap 20 (H) 5 - 15  CBC with Differential     Status: Abnormal   Collection Time: 02/09/14 12:40 PM  Result Value Ref Range   WBC 4.5 4.0 - 10.5 K/uL   RBC 3.86 (L) 3.87 - 5.11 MIL/uL   Hemoglobin 11.1 (L) 12.0 - 15.0 g/dL   HCT 33.1 (L) 36.0 - 46.0 %   MCV 85.8 78.0 - 100.0 fL   MCH 28.8 26.0 - 34.0 pg   MCHC 33.5 30.0 - 36.0 g/dL   RDW 15.4 11.5 - 15.5 %   Platelets 241 150 - 400 K/uL   Neutrophils Relative % 65 43 - 77 %   Neutro Abs 2.9 1.7 - 7.7 K/uL   Lymphocytes Relative 24 12 - 46 %   Lymphs Abs 1.1 0.7 - 4.0 K/uL   Monocytes Relative 7 3 - 12 %   Monocytes Absolute 0.3 0.1 - 1.0 K/uL   Eosinophils Relative 4 0 - 5 %   Eosinophils Absolute 0.2 0.0  - 0.7 K/uL   Basophils Relative 0 0 - 1 %   Basophils Absolute 0.0 0.0 - 0.1 K/uL  Glucose, capillary     Status: Abnormal   Collection Time: 02/09/14  4:24 PM  Result  Value Ref Range   Glucose-Capillary 120 (H) 70 - 99 mg/dL   Comment 1 Notify RN   Glucose, capillary     Status: None   Collection Time: 02/09/14  8:44 PM  Result Value Ref Range   Glucose-Capillary 93 70 - 99 mg/dL   Comment 1 Notify RN   Glucose, capillary     Status: Abnormal   Collection Time: 02/10/14  7:13 AM  Result Value Ref Range   Glucose-Capillary 115 (H) 70 - 99 mg/dL   Comment 1 Notify RN       Nursing note and vitals reviewed.  Constitutional: She is oriented to person, place, and time. She appears well-developed and well-nourished.  HENT:  Head: Normocephalic and atraumatic.  Eyes: Conjunctivae are normal. Pupils are equal, round, and reactive to light.  Neck: Normal range of motion.  Cardiovascular: Normal rate and regular rhythm.  No murmur heard.  Respiratory: Effort normal and breath sounds normal. No respiratory distress. She has no wheezes. She exhibits no tenderness (sternal area with radiation markings.)  GI: Soft. Bowel sounds are normal. She exhibits no distension. There is no tenderness.  Musculoskeletal: She exhibits no edema and no tenderness.  Neurological: She is alert and oriented to person, place, and time.   Dense paraparesis with flexor spasms and 3 beats clonus on the right.  Diminished sensation 1/2 below lower/mid chest wall.   Skin: Skin is warm and dry. No breakdown or redness in gluts/sacral areas. Surgical area well healed.  Psychiatric: remains quiet/flat.  Motor strength is 5/5 bilateral deltoids, biceps, triceps, grip  0/5 bilateral hip flexors knee extensors ankle dorsiflexor plantar flexor Tone 2/4 left greater than right LE's and fluctuating   Assessment/Plan: 1. Functional deficits secondary to Paraplegia which require 3+ hours per day of interdisciplinary  therapy in a comprehensive inpatient rehab setting. Physiatrist is providing close team supervision and 24 hour management of active medical problems listed below. Physiatrist and rehab team continue to assess barriers to discharge/monitor patient progress toward functional and medical goals.    FIM: FIM - Bathing Bathing Steps Patient Completed: Chest, Right Arm, Left Arm, Abdomen, Right upper leg, Left upper leg Bathing: 3: Mod-Patient completes 5-7 55f 10 parts or 50-74%  FIM - Upper Body Dressing/Undressing Upper body dressing/undressing steps patient completed: Thread/unthread right sleeve of pullover shirt/dresss, Thread/unthread left sleeve of pullover shirt/dress, Put head through opening of pull over shirt/dress Upper body dressing/undressing: 4: Min-Patient completed 75 plus % of tasks FIM - Lower Body Dressing/Undressing Lower body dressing/undressing: 1: Total-Patient completed less than 25% of tasks  FIM - Toileting Toileting: 1: Total-Patient completed zero steps, helper did all 3  FIM - Air cabin crew Transfers: 0-Activity did not occur  FIM - Control and instrumentation engineer Devices: Sliding board, Arm rests, Bed rails Bed/Chair Transfer: 3: Supine > Sit: Mod A (lifting assist/Pt. 50-74%/lift 2 legs, 3: Bed > Chair or W/C: Mod A (lift or lower assist), 3: Chair or W/C > Bed: Mod A (lift or lower assist), 3: Sit > Supine: Mod A (lifting assist/Pt. 50-74%/lift 2 legs)  FIM - Locomotion: Wheelchair Distance: 400 Locomotion: Wheelchair: 6: Travels 150 ft or more, turns around, maneuvers to table, bed or toilet, negotiates 3% grade: maneuvers on rugs and over door sills independently FIM - Locomotion: Ambulation Locomotion: Ambulation: 0: Activity did not occur  Comprehension Comprehension Mode: Auditory Comprehension: 6-Follows complex conversation/direction: With extra time/assistive device  Expression Expression Mode: Verbal Expression:  6-Expresses complex ideas: With extra time/assistive  device  Social Interaction Social Interaction: 6-Interacts appropriately with others with medication or extra time (anti-anxiety, antidepressant).  Problem Solving Problem Solving: 6-Solves complex problems: With extra time  Memory Memory: 6-More than reasonable amt of time  Medical Problem List and Plan:  1. 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   -xrt completed 2. DVT Prophylaxis/Anticoagulation: Pharmaceutical: Xarelto per Hem/Onc input.  3. Pain Management: increase baclofen to better manage spasticity 4. Mood:prn ativan for anxiety,       -pt aware of situational anxiety with therapy-- 5. Neuropsych: This patient is capable of making decisions on her own behalf.  6. Skin/Wound Care: Air mattress overlay for prevention of break down. Start educating patient and family on need for pressure relief measures.  7. Fluids/Electrolytes/Nutrition: encourage po intake..  -still eating little. Is taking some fluids  -labs normal  -reinforced importance of PO if she wants to go home. 8. Neurogenic bladder: foley 9. 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 For RAI at 6-8 weeks post op.  11. HTN: Will monitor every 8 hours. No medications as this time. Monitor for orthostatic changes once OOB.  12   Hyperglycemia: Hgb A1c-6.7.   -sugars reasonable 13. E coli UTI--keflex complete 14. Hemorrhoids: continue proctofoam  am bid in addition to good rectal hygiene to prevent irritation from stool.  15. Spasticity: baclofen  65m TID---continue for now 16. Dysphagia: GERD/XRT   -carafate, protonix---change back to PO  -reinforced  the importance of nutrition and getting SOMETHING down, even if it's just a shake,liquid, pudding, etc   LOS (Days) 22 A FACE TO FACE EVALUATION WAS PERFORMED  Shanea Karney T 02/10/2014, 7:44 AM

## 2014-02-10 NOTE — Discharge Summary (Signed)
Physician Discharge Summary  Patient ID: Sarah [name] MRN: [id number] DOB/AGE: 1966-02-28 48 y.o.  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  Normal colonic stool burden.  Degenerative changes of the lumbar spine.  IMPRESSION: Unremarkable abdominal radiograph.   Electronically Signed   By: Julian Hy M.D.   On: 01/19/2014 20:30   Dg Abd Portable 1v  02/10/2014   CLINICAL DATA:  Nausea, vomiting, no bowel activity for 3 days. Abdominal pain.  EXAM: PORTABLE ABDOMEN - 1 VIEW  COMPARISON:  01/19/2014; CT abdomen pelvis - 12/14/2013  FINDINGS: Nonobstructive bowel gas pattern. Unremarkable colonic stool burden.  Nondiagnostic evaluation pneumoperitoneum secondary supine positioning exclusion a lower thorax. No pneumatosis or portal venous gas.  No definite abnormal intra-abdominal calcifications.  No acute osseus abnormalities.  IMPRESSION: Nonobstructive bowel gas pattern. Unremarkable colonic stool burden.   Electronically Signed   By: Sandi Mariscal M.D.   On: 02/10/2014 14:11    Labs:  Basic Metabolic Panel:  Recent Labs Lab 02/09/14 1240  NA 136*  K 3.5*  CL 96  CO2 20  GLUCOSE 108*  BUN 10  CREATININE 0.64  CALCIUM 8.9    CBC:  Recent Labs Lab 02/09/14 1240  WBC 4.5  NEUTROABS 2.9  HGB 11.1*  HCT 33.1*  MCV 85.8  PLT 241    CBG:  Recent Labs Lab 02/09/14 0650 02/09/14 1139 02/09/14 1624 02/09/14 2044 02/10/14 0713  GLUCAP 111*  108* 120* 93 115*    Brief HPI:   Sarah [name] is a 48 y.o. female with history of HTN, chronic back pain, morbid obesity who was admitted on 12/10/13 with BLE weakness, inability to walk as well as problems with urinary retention and constipation. Work up revealed a lesion at the T3 level of the upper thoracic spine with pathologic compression fracture and epidural tumor resulting in moderate cord compression with evidence of mild spinal cord edema, as well as right suprahilar mass with pulmonary nodules with mediastinal lymph nodes c/w metastatic disease, RUL & RLL PE, tubular cystic masses bilateral adnexal regions and a massive thyroid goiter causing tracheal deviation to the right with narrowing of trachea. She was started on steriods and foley placed for urinary retention. She underwent thoracic laminectomy with excision of tumor on 09/23 by Dr. Christella Noa with pathology favoring metastatic adenocarcinoma of lung but metastatic thyroid cancer was in differential . FNA of thyroid mass showed atypical oncocytic neoplasm and Dr. Buddy Duty consulted for input. 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  Right hilar mass without clear pathologic diagnosis and patient to follow up with Dr. Lamonte Sakai in 1-2 months on outpatient basis. Patient underwent T1-T5 posterolateral arthrodesis with allograft on 10/09 as we;; as total thyroidectomy on 10/19. 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 She was changed back to Lovenox  as platelets have stabilized. Dr. Hosie Poisson recommends transition to Xarelto once all surgeries completed. Dr. [name] consulted for XRT to spine for pain palliation as well as prevention of further spinal cord injury and simulation was done on 10/27.  She continues to have  paraparesis and therapy working on bed mobility as well as sitting balance at edge of bed. Foley remains in place and bowel incontinence reported as well as constipation issues reported.    Hospital Course: [name] was admitted to rehab 01/19/2014 for inpatient therapies to consist of PT and OT at least three hours five days a week. Past admission physiatrist, therapy team and rehab RN have worked together to provide customized collaborative inpatient rehab. Endurance has improved and pain has been reasonably controlled on prn hydrocodone. She was started on baclofen for spasticity control.  She was placed on Xarelto for treatment of PE. CBC has been monitored with serial checks and H/H has shown steady improvement.  She has had intermittent hypokalemia due to N/V with poor po intake.   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 Protonix as well as Carafate were added for symptom management. She continues to have GERD symptoms and has been educated on importance of maintaining oral intake to prevent dehydration.   She was started on bowel program with suppository at night. She did develop loose stools during radiation and this had resolved once treatments completed. Bowel program was therefore augmented further prior to discharge. Foley was changed out on 11/18 and repeat urine culture was negative. Ego support has been provided by team and xanax was used for anxiety control. Dr. Pollard/neuropsychology has followed for support and evaluation and patient reported feeling frustrated and overwhelmed, but seems to be coping with this in a healthy way. No significant cognitive impairment reported. [name] showing improvement in activity tolerance and is at min to total assist at wheelchair level. She will continue to receive HHPT, HHRN and HHSW by [facility] past discharge. She has also been referred to [place] [facility] for personal supplies and [facility] for [facility]  aide.    Rehab course: During patient's stay in rehab weekly team conferences were held to monitor patient's progress, set goals and discuss barriers to discharge. Patient has had improvement in activity tolerance, balance, postural control, as well as ability to compensate for deficits. She requires moderate assist with bathing, min assist with upper body dressing and total assist with lower body dressing.  She requires moderate assist for supine to sitting and min assist for dynamic sitting balance at EOB. She requires moderate assist for lateral sliding board transfers. She is modified independent for navigating power wheelchair. Family education was done with sister and mother regarding all aspects of self care, use of hoyer lift as well as assistance needed with mobility.    Disposition: Home  Diet: Liquid diet till symptoms improve  Special Instructions: 1. Increase miralax to twice a day if you don't have a bowel movement in 48 hours.  And follow with fleets enema instead of suppository that evening.  2. Drink room temperature liquids/supplements 4-5 times a day till able to eat without difficulty. You need to drink sips thorough out the day if you can't drink 6-8 oz at a time.      Medication List    STOP taking these medications        antiseptic oral rinse 0.05 % Liqd solution  Commonly known as:  CPC / CETYLPYRIDINIUM CHLORIDE 0.05%  cephALEXin 500 MG capsule  Commonly known as:  KEFLEX     cyclobenzaprine 5 MG tablet  Commonly known as:  FLEXERIL     diazepam 5 MG tablet  Commonly known as:  VALIUM     diclofenac 75 MG EC tablet  Commonly known as:  VOLTAREN     enoxaparin 150 MG/ML injection  Commonly known as:  LOVENOX     hydrocortisone 2.5 % rectal cream  Commonly known as:  ANUSOL-HC     ibuprofen 200 MG tablet  Commonly known as:  ADVIL,MOTRIN     insulin aspart 100 UNIT/ML injection  Commonly known as:  novoLOG     sodium chloride 0.9 % infusion      sodium chloride 0.9 % injection      TAKE these medications        acetaminophen 325 MG tablet  Commonly known as:  TYLENOL  Take 2 tablets (650 mg total) by mouth every 4 (four) hours as needed (mild pain).     albuterol 108 (90 BASE) MCG/ACT inhaler  Commonly known as:  PROVENTIL HFA;VENTOLIN HFA  Inhale 2 puffs into the lungs every 6 (six) hours as needed for wheezing or shortness of breath.     baclofen 10 MG tablet  Commonly known as:  LIORESAL  Take 1 tablet (10 mg total) by mouth 3 (three) times daily.     Glycerin (Adult) 2.1 G Supp  Place 1 suppository rectally daily after supper.     HYDROcodone-acetaminophen 5-325 MG per tablet--Rx # 100  Commonly known as:  NORCO/VICODIN  Take 1-2 tablets by mouth every 6 (six) hours as needed for severe pain.     levothyroxine 125 MCG tablet  Commonly known as:  SYNTHROID, LEVOTHROID  Take 2 tablets (250 mcg total) by mouth daily before breakfast.     LORazepam 0.5 MG tablet--Rx # 30 pills  Commonly known as:  ATIVAN  Take 1 tablet (0.5 mg total) by mouth 2 (two) times daily as needed for anxiety.     menthol-cetylpyridinium 3 MG lozenge  Commonly known as:  CEPACOL  Take 1 lozenge (3 mg total) by mouth as needed for sore throat (sore throat).     pantoprazole 40 MG injection  Commonly known as:  PROTONIX  Inject 40 mg into the vein every 12 (twelve) hours.     phenol 1.4 % Liqd  Commonly known as:  CHLORASEPTIC  Use as directed 1 spray in the mouth or throat as needed for throat irritation / pain.     polyethylene glycol packet  Commonly known as:  MIRALAX / GLYCOLAX  Take 17 g by mouth daily.     promethazine 25 MG tablet  Commonly known as:  PHENERGAN  Take 1 tablet (25 mg total) by mouth every 6 (six) hours as needed for nausea or vomiting.     rivaroxaban 20 MG Tabs tablet  Commonly known as:  XARELTO  Take 1 tablet (20 mg total) by mouth daily.     senna 8.6 MG Tabs tablet  Commonly known as:  SENOKOT   Take 2 tablets (17.2 mg total) by mouth daily at 12 noon.     sucralfate 1 GM/10ML suspension  Commonly known as:  CARAFATE  Take 10 mLs (1 g total) by mouth 4 (four) times daily -  with meals and at bedtime.       Follow-up Information    Follow up with [name], MD On 03/31/2014.   Specialty:  Physical  Medicine and Rehabilitation   Why:  Be there at  12:14 for 12:45 pm  appointment   Contact information:   [address], [place] [address] [phone]       Follow up with Delrae Rend, MD. Call today.   Specialty:  Endocrinology   Why:  for follow up appointment   Contact information:   [address] [place] [address] [phone]       Follow up with Alease Frame Marylynn Pearson, MD On 02/24/2014.   Specialty:  Family Medicine   Why:  @ 2:00 pm   Contact information:   [address] [place] [address] [phone]       Follow up with Lora Paula, MD On 03/09/2014.   Specialty:  Radiation Oncology   Why:  Be    Contact information:   [address] [place] [address] [phone]       Signed: Bary Leriche 02/14/2014, 5:57 PM

## 2014-02-10 NOTE — Progress Notes (Signed)
Physical Therapy Discharge Summary  Patient Details  Name: Sarah [name] MRN: [id number] Date of Birth: February 10, 1966  Today's Date: 02/10/2014    Patient has met 5 of 7 long term goals due to improved activity tolerance, improved balance, improved postural control, ability to compensate for deficits and improved coordination.  Patient to discharge at a wheelchair level Total Assist.   Patient's care partner is independent to provide the necessary physical assistance at discharge.  Reasons goals not met: Patient was unable to achieve min A for bed mobility due to being unable to use leg loops to manage legs; Pt req up to mod A for supine to/from sit transfers. Patient was unable to demonstrate mod  I dynamic sit balance due to LOB during anterior/posterior dynamic sitting balance activity req min A to correct during last day of therapy.   Recommendation:  Patient will benefit from ongoing skilled PT services in home health setting to continue to advance safe functional mobility, address ongoing impairments in bed mobility, transfers, dynamic sit balance, LE ROM, and minimize fall risk.  Equipment: hospital bed, loaner power w/c (custom power w/c paperwork submitted), hoyer lift  Reasons for discharge: discharge from hospital  Patient/family agrees with progress made and goals achieved: Yes  PT Discharge Precautions/Restrictions Precautions Precautions: Fall;Back Precaution Comments: bil leg weakness, paraplegia Restrictions Weight Bearing Restrictions: No Pain Pain Assessment Pain Assessment: 0-10 Pain Score: 4  Pain Type: Acute pain Vision/Perception  Perception Comments: wfl  Cognition Overall Cognitive Status: Within Functional Limits for tasks assessed Arousal/Alertness: Awake/alert Orientation Level: Oriented X4 Attention: Focused;Sustained Focused Attention: Appears intact Sustained Attention: Appears intact Memory: Appears intact Awareness: Appears intact Problem  Solving: Appears intact Safety/Judgment: Appears intact Sensation Sensation Light Touch: Impaired Detail Light Touch Impaired Details: Impaired RLE;Impaired LLE Stereognosis: Not tested Hot/Cold: Not tested Proprioception: Not tested Coordination Gross Motor Movements are Fluid and Coordinated: No Fine Motor Movements are Fluid and Coordinated: Not tested Coordination and Movement Description: paraplegia - occasional extensor tone in B LEs Finger Nose Finger Test: wfl bilaterally Heel Shin Test: unable to complete Motor  Motor Motor: Paraplegia;Abnormal tone;Abnormal postural alignment and control  Mobility Bed Mobility Bed Mobility: Rolling Right;Rolling Left;Left Sidelying to Sit;Sit to Supine Rolling Right: 4: Min assist;With rail Rolling Right Details: Manual facilitation for placement Rolling Left: 4: Min assist;With rail Rolling Left Details: Manual facilitation for weight shifting;Manual facilitation for placement Left Sidelying to Sit: 3: Mod assist;With rails;HOB flat Left Sidelying to Sit Details: Manual facilitation for placement Sit to Supine: 3: Mod assist;With rail;HOB flat Sit to Supine - Details: Manual facilitation for placement Scooting to HOB: 6: Modified independent (Device/Increase time) Transfers Transfers: Yes Lateral/Scoot Transfers: 3: Mod assist;With slide board Lateral/Scoot Transfer Details: Manual facilitation for placement;Verbal cues for technique Lateral/Scoot Transfer Details (indicate cue type and reason): head-hips relationship and hand placement Locomotion  Ambulation Ambulation: No Gait Gait: No Stairs / Additional Locomotion Stairs: No Wheelchair Mobility Wheelchair Mobility: Yes Wheelchair Assistance: 6: Modified independent (Device/Increase time) Environmental health practitioner: Power Wheelchair Parts Management: Needs assistance Distance: 400  Trunk/Postural Assessment  Cervical Assessment Cervical Assessment: Within Education officer, environmental Assessment: Exceptions to [facility] Thoracic AROM Overall Thoracic AROM: Deficits;Due to precautions Overall Thoracic AROM Comments: spinal precautions Lumbar Assessment Lumbar Assessment: Within Functional Limits Postural Control Postural Control: Deficits on evaluation Head Control: head forward and down at edge of bed - able to correct head position but spinal muscles fatigue quickly Trunk Control: relies heavily on bedrail edge of bed and  external support - able to sit more upright, but limited by paraplegia in the trunk and spinal muscle fatigue Righting Reactions: wfl  Balance Balance Balance Assessed: Yes Static Sitting Balance Static Sitting - Balance Support: Bilateral upper extremity supported;Feet supported Static Sitting - Level of Assistance: 5: Stand by assistance Dynamic Sitting Balance Dynamic Sitting - Balance Support: Bilateral upper extremity supported;Feet supported Dynamic Sitting - Level of Assistance: 4: Min assist Static Standing Balance Static Standing - Level of Assistance: 1: +1 Total assist Static Standing - Comment/# of Minutes: Pt tolerates standing a few minutes in electric standing frame Extremity Assessment  RUE Assessment RUE Assessment: Within Functional Limits (arm flexion 3+/5, elbow grossly 4/5, grip 5/5) LUE Assessment LUE Assessment: Within Functional Limits (arm flexion 3+/5, elbow grossly 4/5, grip 5/5) RLE Assessment RLE Assessment: Exceptions to [facility] RLE Strength RLE Overall Strength: Deficits RLE Overall Strength Comments: trace hip extension noted, otherwise 0/5 RLE Tone RLE Tone: Modified Ashworth Modified Ashworth Scale for Grading Hypertonia RLE: More marked increase in muscle tone through most of the ROM, but affected part(s) easily moved LLE Assessment LLE Assessment: Exceptions to [facility] LLE Strength LLE Overall Strength: Deficits LLE Overall Strength Comments: trace hip extension, otherwise  0/5 LLE Tone LLE Tone: Modified Ashworth Modified Ashworth Scale for Grading Hypertonia LLE: More marked increase in muscle tone through most of the ROM, but affected part(s) easily moved  See FIM for current functional status  Chanie Soucek M 02/10/2014, 12:51 PM

## 2014-02-10 NOTE — Progress Notes (Signed)
Occupational Therapy Discharge Summary  Patient Details  Name: Sarah [name] MRN: [id number] Date of Birth: 25-Nov-1965  Today's Date: 02/10/2014  Patient has met 7 of 8 long term goals due to improved activity tolerance, ability to compensate for deficits and improved awareness.  Patient to discharge at overall Mod Assist level.  Patient's care partner is independent to provide the necessary physical assistance at discharge.   Goals downgraded during admission d/t lack of progress, medical complications relating to radiation treatment and reservations expressed by pt regarding her priorities and preferences with assisted self-care.    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   Recommendation:  Patient will benefit from ongoing skilled PT services in home health setting to continue to advance functional skills in the area of Reduce care partner burden.  Equipment: No equipment provided  Reasons for discharge: discharge from hospital  Patient/family agrees with progress made and goals achieved: Yes  OT Discharge Precautions/Restrictions  Precautions Precautions: Fall;Back Precaution Comments: bil leg weakness, paraplegia Restrictions Weight Bearing Restrictions: No  Vital Signs Therapy Vitals Temp: 98.6 F (37 C) Temp Source: Oral Pulse Rate: 98 Resp: 20 BP: 126/82 mmHg Oxygen Therapy SpO2: 98 % O2 Device: Not Delivered  Pain Pain Assessment Pain Assessment: 0-10 Pain Score: 4  Pain Type: Acute pain Pain Location: Back Pain Descriptors / Indicators: Aching Pain Frequency: Intermittent Pain Onset: On-going Patients Stated Pain Goal: 2 Pain  Intervention(s): Medication (See eMAR);Emotional support Multiple Pain Sites: No  ADL ADL ADL Comments: see FIM  Vision/Perception  Vision- History Baseline Vision/History: Wears glasses Wears Glasses: Reading only Patient Visual Report: No change from baseline Vision- Assessment Vision Assessment?: No apparent visual deficits Perception Comments: WFL   Cognition Overall Cognitive Status: Within Functional Limits for tasks assessed Arousal/Alertness: Awake/alert Orientation Level: Oriented X4 Attention: Alternating Alternating Attention: Appears intact Memory: Appears intact Awareness: Appears intact Problem Solving: Appears intact Safety/Judgment: Appears intact  Sensation Sensation Light Touch: Impaired Detail Light Touch Impaired Details: Impaired RLE;Impaired LLE Stereognosis: Appears Intact Hot/Cold: Appears Intact Proprioception: Appears Intact Proprioception Impaired Details: Absent RLE;Absent LLE Additional Comments: BUE WFL Coordination Gross Motor Movements are Fluid and Coordinated: Yes (@ BUE) Fine Motor Movements are Fluid and Coordinated: Yes (@ BUE) Coordination and Movement Description: paraplegia  Motor  Motor Motor: Paraplegia;Abnormal tone  Mobility  Bed Mobility Bed Mobility: Rolling Right;Rolling Left;Left Sidelying to Sit;Supine to Sit;Scooting to [facility] Rolling Right: 4: Min assist;With rail Rolling Right Details: Manual facilitation for weight shifting;Manual facilitation for placement Rolling Left: 4: Min assist;With rail Rolling Left Details: Manual facilitation for weight shifting;Manual facilitation for placement Left Sidelying to Sit: 2: Max assist Left Sidelying to Sit Details: Manual facilitation for placement;Tactile cues for posture Supine to Sit: HOB elevated;With rails;3: Mod assist Supine to Sit Details: Manual facilitation for placement;Verbal cues for technique Sit to Supine: 3: Mod assist Sit to Supine - Details: Manual  facilitation for placement Scooting to HOB: 2: Max assist, +2 assist Transfers Transfers:  (Primary mech lift)   Trunk/Postural Assessment  Cervical Assessment Cervical Assessment: Within Functional Limits Thoracic Assessment Thoracic Assessment: Exceptions to [facility] Thoracic AROM Overall Thoracic AROM: Deficits;Due to precautions Lumbar Assessment Lumbar Assessment: Within Functional Limits Postural Control Postural Control: Deficits on evaluation Postural Limitations: poor trunk control edge  of bed (unstable surface)   Balance Static Sitting Balance Static Sitting - Balance Support: Left upper extremity supported;Feet supported Static Sitting - Level of Assistance: 5: Stand by assistance Dynamic Sitting Balance Dynamic Sitting - Balance Support: Left upper extremity supported;Feet supported Dynamic Sitting - Level of Assistance: 4: Min assist Dynamic Sitting - Balance Activities: Lateral lean/weight shifting;Trunk control activities  Extremity/Trunk Assessment RUE Assessment RUE Assessment: Within Functional Limits LUE Assessment LUE Assessment: Within Functional Limits  See FIM for current functional status  Sarah [name] 02/10/2014, 7:23 AM

## 2014-02-10 NOTE — Plan of Care (Signed)
Problem: SCI BOWEL ELIMINATION Goal: RH STG MANAGE BOWEL WITH ASSISTANCE STG Manage Bowel with max Assistance.  Outcome: Not Progressing Goal: RH STG SCI MANAGE BOWEL WITH MEDICATION WITH ASSISTANCE STG SCI Manage bowel with medication with minimal assistance.  Outcome: Not Progressing Goal: RH STG SCI MANAGE BOWEL PROGRAM W/ASSIST OR AS APPROPRIATE STG SCI Manage bowel program w/ max assist or as appropriate.  Outcome: Not Progressing  Problem: SCI BLADDER ELIMINATION Goal: RH STG MANAGE BLADDER WITH ASSISTANCE STG Manage Bladder With total Assistance (foley in place)  Outcome: Not Progressing Limited urine output Goal: RH STG SCI MANAGE BLADDER PROGRAM W/ASSISTANCE Outcome: Not Progressing  Problem: RH SKIN INTEGRITY Goal: RH STG SKIN FREE OF INFECTION/BREAKDOWN Skin free of infection/breakdown with moderatel assistance  Outcome: Progressing  Problem: RH PAIN MANAGEMENT Goal: RH STG PAIN MANAGED AT OR BELOW PT'S PAIN GOAL Pain less than 3  Outcome: Progressing

## 2014-02-10 NOTE — Plan of Care (Signed)
Problem: SCI BOWEL ELIMINATION Goal: RH STG MANAGE BOWEL WITH ASSISTANCE STG Manage Bowel with max Assistance.  Outcome: Completed/Met Date Met:  02/10/14 Goal: RH STG SCI MANAGE BOWEL WITH MEDICATION WITH ASSISTANCE STG SCI Manage bowel with medication with minimal assistance.  Outcome: Completed/Met Date Met:  02/10/14 Goal: RH STG MANAGE BOWEL W/EQUIPMENT W/ASSISTANCE STG Manage Bowel With Equipment With moderate Assistance  Outcome: Completed/Met Date Met:  02/10/14 Goal: RH STG SCI MANAGE BOWEL PROGRAM W/ASSIST OR AS APPROPRIATE STG SCI Manage bowel program w/ max assist or as appropriate.  Outcome: Completed/Met Date Met:  02/10/14  Problem: SCI BLADDER ELIMINATION Goal: RH STG MANAGE BLADDER WITH ASSISTANCE STG Manage Bladder With total Assistance (foley in place)  Outcome: Completed/Met Date Met:  02/10/14 Goal: RH STG SCI MANAGE BLADDER PROGRAM W/ASSISTANCE Outcome: Completed/Met Date Met:  02/10/14  Problem: RH SKIN INTEGRITY Goal: RH STG SKIN FREE OF INFECTION/BREAKDOWN Skin free of infection/breakdown with moderatel assistance  Outcome: Completed/Met Date Met:  02/10/14 Goal: RH STG MAINTAIN SKIN INTEGRITY WITH ASSISTANCE STG Maintain Skin Integrity With moderate Assistance.  Outcome: Completed/Met Date Met:  02/10/14  Problem: RH PAIN MANAGEMENT Goal: RH STG PAIN MANAGED AT OR BELOW PT'S PAIN GOAL Pain less than 3  Outcome: Completed/Met Date Met:  02/10/14

## 2014-02-11 ENCOUNTER — Inpatient Hospital Stay (HOSPITAL_COMMUNITY): Payer: Medicaid Other | Admitting: Physical Therapy

## 2014-02-13 ENCOUNTER — Telehealth: Payer: Self-pay | Admitting: Family Medicine

## 2014-02-13 NOTE — Telephone Encounter (Signed)
Beverly from [place] called and would like verbal orders for 60 days for the patient. They would like PT, OT, Home health and nursing aide. Please call Rise Paganini at [phone] to give verbal orders.

## 2014-02-14 ENCOUNTER — Telehealth: Payer: Self-pay | Admitting: *Deleted

## 2014-02-14 DIAGNOSIS — K219 Gastro-esophageal reflux disease without esophagitis: Secondary | ICD-10-CM | POA: Diagnosis not present

## 2014-02-14 NOTE — Telephone Encounter (Signed)
Called the number provided. No answer. Left a message stating the context of the call, the inquiry for verbal orders, and my willingness that have those orders placed. I also asked that they call w/ any questions.

## 2014-02-14 NOTE — Telephone Encounter (Signed)
Called on pt behalf, states Sarah [name], [facility] wrote an RX for protonix injection. They need the script to be switched to tablets

## 2014-02-15 ENCOUNTER — Telehealth: Payer: Self-pay | Admitting: Family Medicine

## 2014-02-15 NOTE — Telephone Encounter (Signed)
Beverly from [place] called and would like to speak to a nurse or doctor about the patient Sarah [name]. The patient needs Protonix in fill form since the patient doesn't have an IV. Please send this to [facility] in [place]. Please call Rise Paganini at [phone] if you have any questions. jw

## 2014-02-17 ENCOUNTER — Telehealth: Payer: Self-pay | Admitting: Family Medicine

## 2014-02-17 NOTE — Telephone Encounter (Signed)
After hours line  HH RN from [facility] calling in due to constipation concerns. States she has been using dulcolax suppositories throughout tthe week without any BM. No discomfort on the patients part. PAtient has been PO'ing well but slowly advancing diet this week, now on full liquids.   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 She feels she may need rectal stim for BM and wonders if I will give verbal order. I agreed, verbal order for rectal stim given, also recommended enema, esp if there is a good stool burden on rectal stim.   She will try rectal stim and enema as needed.   Laroy Apple, MD [place] Resident, PGY-3 02/17/2014, 10:32 AM

## 2014-02-21 MED ORDER — PANTOPRAZOLE SODIUM 40 MG PO TBEC: 40.0000 mg | DELAYED_RELEASE_TABLET | Freq: Every day | ORAL | Status: DC

## 2014-02-21 NOTE — Telephone Encounter (Signed)
Order placed for protonix PO.

## 2014-02-22 ENCOUNTER — Telehealth: Payer: Self-pay | Admitting: Family Medicine

## 2014-02-22 MED ORDER — FLUCONAZOLE 150 MG PO TABS: 150.0000 mg | ORAL_TABLET | Freq: Once | ORAL | Status: DC

## 2014-02-22 MED ORDER — CEPHALEXIN 500 MG PO CAPS: 500.0000 mg | ORAL_CAPSULE | Freq: Four times a day (QID) | ORAL | Status: DC

## 2014-02-22 NOTE — Telephone Encounter (Signed)
[facility] Line Call  Home health nurse called to discuss patients lab results. She notes that the patients urine looked like gravy yesterday so she obtained a UA and UCx. UA with 2+ ketones, 1+ protein, + nitrites, + leuks, + blood, 2+ bacteria. Patient reports burning with urination and general burning in her vaginal area with a raw sensation. She denies fevers. Has had a decreased appetite. No nausea or vomiting. No complaint of chills. Nurse notes her vitals are 126/82 98% 92 97.6. Nurse changed the foley. Given the symptoms and UA findings will send in prescription for keflex and diflucan. Advised on using barrier cream for irritation. Await culture results. Advised to call for an appointment in the morning. Will send to admin team to call patient to set up an appointment on 02/23/14.   Tommi Rumps, MD

## 2014-02-22 NOTE — Telephone Encounter (Signed)
[facility] called because they sent the labs for the patient and want to know if anything is being called in or what should the next step be. jw

## 2014-02-22 NOTE — Telephone Encounter (Signed)
Rise Paganini called back from [place] and wanted to give Dr. Alease Frame her number if he has any questions. Please call her [phone] and the lab results are in your box that they faxed over. jw

## 2014-02-22 NOTE — Telephone Encounter (Signed)
It appears Rx was changed

## 2014-02-24 ENCOUNTER — Telehealth: Payer: Self-pay | Admitting: *Deleted

## 2014-02-24 ENCOUNTER — Inpatient Hospital Stay: Payer: Medicaid Other | Admitting: Family Medicine

## 2014-02-24 NOTE — Telephone Encounter (Signed)
I returned a call to [place] on on behalf of Miss Peden. We discussed the recent UA lab results as well as the medication which have been provided by Dr. Caryl Bis. Rise Paganini stated that Miss Kauffmann is tolerating these medications well and that she is already experiencing some relief in her symptoms. I do not believe that any medical management changes are necessary at this time. Rise Paganini also stated that the family is having difficulty with transportation of Miss Glennie and she believes that "we were not prepared to properly take care of her". Rise Paganini stated that Miss Milbrath has been doing well overall. However she is been experiencing increased depression of late.  Miss Skillman has a appointment with Dr. Wendi Snipes on 12/10 for a follow-up on this most recent UTI. I believe it may be beneficial for Dr. Wendi Snipes to address her depression at this time. I will leave it up to his medical judgment on whether or not this depression warrants medical management. I may have a lower threshold to treat Ms. Yiu that I would other patients.

## 2014-02-24 NOTE — Telephone Encounter (Signed)
Called on pt behalf, medicaid has approved only 1 PT visit, Partnership for [facility] urged pt to call Dr. Naaman Plummer to have him contact medicaid and have him declare that it is a medical necessity so she can receive the PT that she needs

## 2014-03-02 ENCOUNTER — Telehealth: Payer: Self-pay | Admitting: Family Medicine

## 2014-03-02 ENCOUNTER — Ambulatory Visit: Payer: Medicaid Other | Admitting: Family Medicine

## 2014-03-02 NOTE — Telephone Encounter (Signed)
[place]:  Pt blood pressure is 150/100; experiencing some pain..has just taken her pain pill now.

## 2014-03-03 ENCOUNTER — Encounter: Payer: Self-pay | Admitting: Family Medicine

## 2014-03-03 ENCOUNTER — Ambulatory Visit (INDEPENDENT_AMBULATORY_CARE_PROVIDER_SITE_OTHER): Payer: Medicaid Other | Admitting: Family Medicine

## 2014-03-03 VITALS — BP 131/81 | HR 90 | Temp 98.1°F | Wt 290.0 lb

## 2014-03-03 DIAGNOSIS — C7951 Secondary malignant neoplasm of bone: Secondary | ICD-10-CM

## 2014-03-03 DIAGNOSIS — M5459 Other low back pain: Secondary | ICD-10-CM

## 2014-03-03 DIAGNOSIS — IMO0002 Reserved for concepts with insufficient information to code with codable children: Secondary | ICD-10-CM

## 2014-03-03 DIAGNOSIS — C7931 Secondary malignant neoplasm of brain: Secondary | ICD-10-CM

## 2014-03-03 DIAGNOSIS — N949 Unspecified condition associated with female genital organs and menstrual cycle: Secondary | ICD-10-CM

## 2014-03-03 DIAGNOSIS — M545 Low back pain: Secondary | ICD-10-CM

## 2014-03-03 DIAGNOSIS — E119 Type 2 diabetes mellitus without complications: Secondary | ICD-10-CM

## 2014-03-03 DIAGNOSIS — G8220 Paraplegia, unspecified: Secondary | ICD-10-CM

## 2014-03-03 DIAGNOSIS — G839 Paralytic syndrome, unspecified: Secondary | ICD-10-CM

## 2014-03-03 LAB — POCT URINALYSIS DIPSTICK
Glucose, UA: NEGATIVE
KETONES UA: 15
Nitrite, UA: NEGATIVE
Protein, UA: 30
SPEC GRAV UA: 1.025
Urobilinogen, UA: 0.2
pH, UA: 5.5

## 2014-03-03 LAB — POCT UA - MICROSCOPIC ONLY

## 2014-03-03 LAB — POCT GLYCOSYLATED HEMOGLOBIN (HGB A1C): Hemoglobin A1C: 4.9

## 2014-03-03 MED ORDER — SERTRALINE HCL 25 MG PO TABS: 25.0000 mg | ORAL_TABLET | Freq: Every day | ORAL | Status: DC

## 2014-03-03 NOTE — Progress Notes (Signed)
Patient ID: Sarah [name], female   DOB: 1965/09/10, 48 y.o.   MRN: [id number]   [facility] Bernadene Bell, MD Phone: [phone]  Subjective:  Sarah [name] is a 48 y.o F who presents for several acute issues. Known metastatic spinal dx (brought by EMS on stretcher) Bedbound, unable to transfer without assistance  # question UTI -initially had 1+ protein and nitrites/blood and leuks in urine  -was prescribed keflex and diflucan by Dr. Caryl Bis- has been taking for the last several days -afebrile, asymptomatic -chronically cath'd (no superpubic at this time)  #elevated BP per [facility]? -asymptomatic at this time -pt remarks she was under stress and worried quite a bit when this was taken   #depression -terminal diagnosis; palliative radiation  -would like something to stabilize mood -no SI/HI -family members have done well on zoloft in the past   All relevant systems were reviewed and were negative unless otherwise noted in the HPI  Past Medical History Reviewed problem list.  Medications- reviewed and updated Current Outpatient Prescriptions  Medication Sig Dispense Refill  . acetaminophen (TYLENOL) 325 MG tablet Take 2 tablets (650 mg total) by mouth every 4 (four) hours as needed (mild pain).    [name] Kitchen albuterol (PROVENTIL HFA;VENTOLIN HFA) 108 (90 BASE) MCG/ACT inhaler Inhale 2 puffs into the lungs every 6 (six) hours as needed for wheezing or shortness of breath.    . baclofen (LIORESAL) 10 MG tablet Take 1 tablet (10 mg total) by mouth 3 (three) times daily. 90 each 1  . cephALEXin (KEFLEX) 500 MG capsule Take 1 capsule (500 mg total) by mouth 4 (four) times daily. 28 capsule 0  . fluconazole (DIFLUCAN) 150 MG tablet Take 1 tablet (150 mg total) by mouth once. 1 tablet 0  . Glycerin, Adult, 2.1 G SUPP Place 1 suppository rectally daily after supper. 30 suppository 1  . HYDROcodone-acetaminophen (NORCO/VICODIN) 5-325 MG per tablet Take 1-2 tablets by mouth  every 6 (six) hours as needed for severe pain. 100 tablet 0  . levothyroxine (SYNTHROID, LEVOTHROID) 125 MCG tablet Take 2 tablets (250 mcg total) by mouth daily before breakfast. 60 tablet 1  . LORazepam (ATIVAN) 0.5 MG tablet Take 1 tablet (0.5 mg total) by mouth 2 (two) times daily as needed for anxiety. 30 tablet 0  . menthol-cetylpyridinium (CEPACOL) 3 MG lozenge Take 1 lozenge (3 mg total) by mouth as needed for sore throat (sore throat). 100 tablet 12  . pantoprazole (PROTONIX) 40 MG tablet Take 1 tablet (40 mg total) by mouth daily. 30 tablet 3  . phenol (CHLORASEPTIC) 1.4 % LIQD Use as directed 1 spray in the mouth or throat as needed for throat irritation / pain.  0  . polyethylene glycol (MIRALAX / GLYCOLAX) packet Take 17 g by mouth daily. 30 each 1  . promethazine (PHENERGAN) 25 MG tablet Take 1 tablet (25 mg total) by mouth every 6 (six) hours as needed for nausea or vomiting. 60 tablet 1  . rivaroxaban (XARELTO) 20 MG TABS tablet Take 1 tablet (20 mg total) by mouth daily. 60 tablet 1  . senna (SENOKOT) 8.6 MG TABS tablet Take 2 tablets (17.2 mg total) by mouth daily at 12 noon. 120 each 0  . sertraline (ZOLOFT) 25 MG tablet Take 1 tablet (25 mg total) by mouth daily. 30 tablet 3  . sucralfate (CARAFATE) 1 GM/10ML suspension Take 10 mLs (1 g total) by mouth 4 (four) times daily -  with meals and at bedtime. Oto[place]  mL 1   No current facility-administered medications for this visit.   Chief complaint-noted No additions to family history Social history- patient is a never smoker  Objective: BP 131/81 mmHg  Pulse 90  Temp(Src) 98.1 F (36.7 C) (Oral)  Wt 290 lb (131.543 kg)  LMP 12/02/2013 (Approximate) Gen: NAD, alert, cooperative with exam; on stretcher Abd: SNTND, BS present, no guarding or organomegaly GU: cath draining; no blood in bag Neuro: Alert and oriented, unable to ambulate or bear weight Skin: no rashes no lesions  Assessment/Plan: See problem based a/p

## 2014-03-03 NOTE — Patient Instructions (Signed)
Good to see you today  Urine looks much better Cath change in about 4 weeks time  Start zoloft--will increase slowly as needed Call if problems Will see you back in about 3-4 weeks  We will follow up on home health services Happy holidays and best wishes! Bernadene Bell MD

## 2014-03-03 NOTE — Assessment & Plan Note (Signed)
Cath in place s/p treatment of UTI U/A here improved Advised to finish course of abx  RTC if needed Would change cath in 4-6 weeks Has already recently been changed after dx of UTI

## 2014-03-03 NOTE — Assessment & Plan Note (Signed)
Pt with HTN related to pain Advised to take meds as needed Not elevated today Will hold on tx

## 2014-03-03 NOTE — Telephone Encounter (Signed)
FYI to PCP, patient has appt today.

## 2014-03-03 NOTE — Assessment & Plan Note (Signed)
Now with depressive like sx Will cont pain medications  Start zoloft at 25mg ; plan to uptitrate  Return in 3-4 weeks

## 2014-03-06 ENCOUNTER — Telehealth: Payer: Self-pay | Admitting: *Deleted

## 2014-03-06 NOTE — Telephone Encounter (Signed)
"  w/c ordering" needs to be done at time of office visit

## 2014-03-06 NOTE — Telephone Encounter (Signed)
Beverly from [facility] called, asking for Dr. Naaman Plummer to write an order for an electric wheelchair for the patient. This patient has an appt upcoming on 03/31/2014

## 2014-03-07 ENCOUNTER — Telehealth: Payer: Self-pay | Admitting: Family Medicine

## 2014-03-07 ENCOUNTER — Telehealth: Payer: Self-pay | Admitting: Clinical

## 2014-03-07 NOTE — Telephone Encounter (Signed)
CSW received a call from [place] that pt has already received her 1 session from PT that Medicaid covers. CSW also informed that agency will see if pt qualifies for an OT visit.  CSW will inform PCP.  Hunt Oris, MSW, [place]

## 2014-03-07 NOTE — Telephone Encounter (Signed)
[place]: need a verbal order for a medical social worker consult.

## 2014-03-09 ENCOUNTER — Ambulatory Visit: Payer: Medicaid Other | Admitting: Radiation Oncology

## 2014-03-09 NOTE — Telephone Encounter (Signed)
Verbal order provided for medical social worker consult.  Discussed patient's current condition with [facility]. Rise Paganini inform me that currently patient is having some nausea vomiting. She's had some loose, mucous-like stools over the past 2 days. She is also had significant "heaviness" feeling in her abdomen. According to [facility] she is still taking her stool softener and laxative. Her last normal BM was Tuesday. No reports of fevers or chills. No other symptoms at this time. I informed Rise Paganini that I would like her to wait another 24 hours before taking any action, in hopes that this may just be a temporary discomfort. Rise Paganini inform me that she has a ability to take a flatplate film. I gave her the instruction that if patient does not feel better after 24 hours and I felt it was appropriate to obtain a flat plate for this patient. I have asked Rise Paganini to keep [place] informed. Rise Paganini voiced her agreement that patient did not need to go to the ED at this time. [place] did not voice any disagreement about this current treatment plan.

## 2014-03-11 ENCOUNTER — Emergency Department (HOSPITAL_COMMUNITY)
Admission: EM | Admit: 2014-03-11 | Discharge: 2014-03-12 | Disposition: A | Discharge status: Home / Self Care | Payer: Medicaid Other | Attending: Emergency Medicine | Admitting: Emergency Medicine

## 2014-03-11 ENCOUNTER — Encounter (HOSPITAL_COMMUNITY): Payer: Self-pay | Admitting: Emergency Medicine

## 2014-03-11 ENCOUNTER — Emergency Department (HOSPITAL_COMMUNITY): Payer: Medicaid Other

## 2014-03-11 DIAGNOSIS — G8929 Other chronic pain: Secondary | ICD-10-CM | POA: Diagnosis not present

## 2014-03-11 DIAGNOSIS — R109 Unspecified abdominal pain: Secondary | ICD-10-CM | POA: Insufficient documentation

## 2014-03-11 DIAGNOSIS — Z79899 Other long term (current) drug therapy: Secondary | ICD-10-CM | POA: Diagnosis not present

## 2014-03-11 DIAGNOSIS — I10 Essential (primary) hypertension: Secondary | ICD-10-CM | POA: Insufficient documentation

## 2014-03-11 DIAGNOSIS — E669 Obesity, unspecified: Secondary | ICD-10-CM | POA: Insufficient documentation

## 2014-03-11 DIAGNOSIS — E876 Hypokalemia: Secondary | ICD-10-CM | POA: Diagnosis not present

## 2014-03-11 DIAGNOSIS — R112 Nausea with vomiting, unspecified: Secondary | ICD-10-CM | POA: Diagnosis not present

## 2014-03-11 DIAGNOSIS — Z3202 Encounter for pregnancy test, result negative: Secondary | ICD-10-CM | POA: Diagnosis not present

## 2014-03-11 DIAGNOSIS — R1084 Generalized abdominal pain: Secondary | ICD-10-CM | POA: Diagnosis present

## 2014-03-11 DIAGNOSIS — R197 Diarrhea, unspecified: Secondary | ICD-10-CM | POA: Diagnosis not present

## 2014-03-11 LAB — CBC WITH DIFFERENTIAL/PLATELET
BASOS ABS: 0 10*3/uL (ref 0.0–0.1)
Basophils Relative: 0 % (ref 0–1)
EOS ABS: 0.1 10*3/uL (ref 0.0–0.7)
Eosinophils Relative: 1 % (ref 0–5)
HEMATOCRIT: 36.4 % (ref 36.0–46.0)
Hemoglobin: 12.1 g/dL (ref 12.0–15.0)
Lymphocytes Relative: 13 % (ref 12–46)
Lymphs Abs: 0.8 10*3/uL (ref 0.7–4.0)
MCH: 28.4 pg (ref 26.0–34.0)
MCHC: 33.2 g/dL (ref 30.0–36.0)
MCV: 85.4 fL (ref 78.0–100.0)
Monocytes Absolute: 0.4 10*3/uL (ref 0.1–1.0)
Monocytes Relative: 7 % (ref 3–12)
Neutro Abs: 4.6 10*3/uL (ref 1.7–7.7)
Neutrophils Relative %: 79 % — ABNORMAL HIGH (ref 43–77)
PLATELETS: 326 10*3/uL (ref 150–400)
RBC: 4.26 MIL/uL (ref 3.87–5.11)
RDW: 14.8 % (ref 11.5–15.5)
WBC: 6 10*3/uL (ref 4.0–10.5)

## 2014-03-11 LAB — URINALYSIS, ROUTINE W REFLEX MICROSCOPIC
Glucose, UA: NEGATIVE mg/dL
NITRITE: NEGATIVE
PROTEIN: 30 mg/dL — AB
Specific Gravity, Urine: 1.021 (ref 1.005–1.030)
UROBILINOGEN UA: 1 mg/dL (ref 0.0–1.0)
pH: 6 (ref 5.0–8.0)

## 2014-03-11 LAB — URINE MICROSCOPIC-ADD ON

## 2014-03-11 LAB — COMPREHENSIVE METABOLIC PANEL
ALT: 8 U/L (ref 0–35)
AST: 19 U/L (ref 0–37)
Albumin: 2.2 g/dL — ABNORMAL LOW (ref 3.5–5.2)
Alkaline Phosphatase: 112 U/L (ref 39–117)
Anion gap: 16 — ABNORMAL HIGH (ref 5–15)
BUN: 7 mg/dL (ref 6–23)
CALCIUM: 8.3 mg/dL — AB (ref 8.4–10.5)
CO2: 24 mEq/L (ref 19–32)
Chloride: 94 mEq/L — ABNORMAL LOW (ref 96–112)
Creatinine, Ser: 0.55 mg/dL (ref 0.50–1.10)
GLUCOSE: 128 mg/dL — AB (ref 70–99)
Potassium: 3 mEq/L — ABNORMAL LOW (ref 3.7–5.3)
SODIUM: 134 meq/L — AB (ref 137–147)
TOTAL PROTEIN: 5.9 g/dL — AB (ref 6.0–8.3)
Total Bilirubin: 0.7 mg/dL (ref 0.3–1.2)

## 2014-03-11 LAB — LIPASE, BLOOD: LIPASE: 11 U/L (ref 11–59)

## 2014-03-11 LAB — PREGNANCY, URINE: Preg Test, Ur: NEGATIVE

## 2014-03-11 MED ORDER — IOHEXOL 300 MG/ML  SOLN: 25.0000 mL | Freq: Once | INTRAMUSCULAR | Status: AC | PRN

## 2014-03-11 MED ORDER — POTASSIUM CHLORIDE 10 MEQ/100ML IV SOLN
10.0000 meq | INTRAVENOUS | Status: AC
  Filled 2014-03-11: qty 100

## 2014-03-11 MED ORDER — ONDANSETRON HCL 4 MG/2ML IJ SOLN
4.0000 mg | Freq: Once | INTRAMUSCULAR | Status: AC
  Administered 2014-03-11: 4 mg via INTRAVENOUS
  Filled 2014-03-11: qty 2

## 2014-03-11 MED ORDER — MORPHINE SULFATE 4 MG/ML IJ SOLN
4.0000 mg | INTRAMUSCULAR | Status: DC | PRN
Start: 2014-03-11 — End: 2014-03-12
  Administered 2014-03-11: 4 mg via INTRAVENOUS
  Filled 2014-03-11: qty 1

## 2014-03-11 MED ORDER — DEXTROSE 5 % IV SOLN
1.0000 g | Freq: Once | INTRAVENOUS | Status: AC
  Administered 2014-03-11: 1 g via INTRAVENOUS
  Filled 2014-03-11: qty 10

## 2014-03-11 MED ORDER — IOHEXOL 300 MG/ML  SOLN
100.0000 mL | Freq: Once | INTRAMUSCULAR | Status: AC | PRN
  Administered 2014-03-11: 100 mL via INTRAVENOUS

## 2014-03-11 MED ORDER — SODIUM CHLORIDE 0.9 % IV SOLN
Freq: Once | INTRAVENOUS | Status: AC
  Administered 2014-03-11: 22:00:00 via INTRAVENOUS

## 2014-03-11 NOTE — ED Provider Notes (Signed)
CSN: [id number]     Arrival date & time 03/11/14  2112 History   First MD Initiated Contact with Patient 03/11/14 2133     Chief Complaint  Patient presents with  . Abdominal Cramping     (Consider location/radiation/quality/duration/timing/severity/associated sxs/prior Treatment) [name] a 48 y.o. female presenting with cramps. The history is provided by the patient and medical records. No language interpreter was used.  Abdominal Cramping Associated symptoms include abdominal pain, nausea and vomiting. Pertinent negatives include no chest pain, coughing, diaphoresis, fatigue, fever, neck pain, rash or weakness.     Sarah [name] is a 48 y.o. female  with a hx of HTN, DDD, obesity, chronic back pain, thyroid cancer presents to the Emergency Department complaining of gradual, persistent, progressively worsening generalized abdominal pain onset 4 days ago with associated nausea and vomiting. Pt reports diarrhea is watery and without melena or hematochezia.  She states emesis has been NBNB and intermittent with 1 episode today.  Pt denies hx of abd surgeries.  She reports pain is cramping in nature and rated at a 7/10.  Pt reports no sick contacts or out of county travel.  Pt was hospitalized 10 weeks for her surgeries and has been at home for 4 weeks.  She reports numerous antibiotics while hospitalized.  She reports she was to be taking Keflex at home after she was discharged, but it made her nauseated and she therefore did not take it. Pt and family deny fever, chills, headache, neck pain, neck stiffness, chest pain, SOB, dizziness, syncope.  Pt has foley catheter in place and is not ambulatory.   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  Subsequent excision of thyroid cancer and tumor excision with laminectomy on 12/14/2013.  Patient admitted to rehabilitation on 01/19/2014 and was discharged on 02/10/2014.  Notes report intermittent nausea vomiting and hypokalemia with poor by mouth intake throughout her time in the hospital and in rehabilitation.  Past Medical History  Diagnosis Date  . Hypertension   . DDD (degenerative disc disease), cervical   . DJD (degenerative joint disease)   . Obesity   . Chronic back pain   . 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 History of rectal fissure   . Rotator cuff tendonitis right   Past Surgical History  Procedure Laterality Date  . Tonsillectomy    . Laminectomy N/A 12/14/2013    Procedure: THORACIC LAMINECTOMY FOR TUMOR THORACIC THREE;  Surgeon: Ashok Pall, MD;  Location: [place] [facility];  Service: Neurosurgery;  Laterality: N/A;  THORACIC LAMINECTOMY FOR TUMOR THORACIC THREE  . Posterior lumbar fusion 4 level N/A 12/30/2013    Procedure: Thoracic one-Thoracic five posterior thoracic fusion with pedicle screws;  Surgeon: Ashok Pall, MD;  Location: [place] [facility];  Service: Neurosurgery;  Laterality: N/A;  Thoracic one-Thoracic five posterior thoracic fusion with pedicle screws  . Thyroidectomy N/A 01/09/2014    Procedure: TOTAL THYROIDECTOMY;  Surgeon: Izora Gala, MD;  Location: [place];  Service: ENT;  Laterality: N/A;   Family History  Problem Relation Age of Onset  . Hypertension Mother   . Diabetes Mother   . Hypertension Father   . Stroke Father    History  Substance Use Topics  . Smoking status: Never Smoker   . Smokeless tobacco: Never Used  . Alcohol Use: No   OB History    No data  available     Review of Systems  Constitutional: Negative for fever, diaphoresis, appetite change, fatigue and unexpected weight change.  HENT: Negative for mouth sores and trouble swallowing.   Respiratory: Negative for cough, chest tightness, shortness of breath, wheezing and stridor.   Cardiovascular: Negative for chest pain and palpitations.  Gastrointestinal: Positive for nausea, vomiting, abdominal pain and diarrhea. Negative for constipation, blood in stool, abdominal distention and rectal pain.  Genitourinary: Negative for dysuria, urgency, frequency, hematuria, flank pain and difficulty urinating.  Musculoskeletal: Negative for back pain, neck pain and neck stiffness.  Skin: Negative for rash.  Neurological: Negative for weakness.  Hematological: Negative for adenopathy.  Psychiatric/Behavioral: Negative for confusion.  All other systems reviewed and are negative.     Allergies  Tramadol  Home Medications   Prior to Admission medications   Medication Sig Start Date End Date Taking? Authorizing Provider  acetaminophen (TYLENOL) 325 MG tablet Take 2 tablets (650 mg total) by mouth every 4 (four) hours as needed (mild pain). 02/10/14  Yes Ivan Anchors Love, PA-C  baclofen (LIORESAL) 10 MG tablet Take 1 tablet (10 mg total) by mouth 3 (three) times daily. 02/10/14  Yes Ivan Anchors Love, PA-C  Glycerin, Adult, 2.1 G SUPP Place 1 suppository rectally daily after supper. Patient taking differently: Place 1 suppository rectally daily as needed (to stimulate a bowel movement).  02/10/14  Yes Ivan Anchors Love, PA-C  HYDROcodone-acetaminophen (NORCO/VICODIN) 5-325 MG per tablet Take 1-2 tablets by mouth every 6 (six) hours as needed for severe pain. Patient taking differently: Take 1 tablet by mouth every 6 (six) hours as needed for severe pain.  02/10/14  Yes Ivan Anchors Love, PA-C  levothyroxine (SYNTHROID, LEVOTHROID) 125 MCG tablet Take 2 tablets (250 mcg total) by mouth daily before breakfast.  02/10/14  Yes Ivan Anchors Love, PA-C  pantoprazole (PROTONIX) 40 MG tablet Take 1 tablet (40 mg total) by mouth daily. 02/21/14  Yes Elberta Leatherwood, MD  polyethylene glycol (MIRALAX / GLYCOLAX) packet Take 17 g by mouth daily. Patient taking differently: Take 17 g by mouth daily as needed (constipation).  02/10/14  Yes Ivan Anchors Love, PA-C  promethazine (PHENERGAN) 25 MG tablet Take 1 tablet (25 mg total) by mouth every 6 (six) hours as needed for nausea or vomiting. 02/10/14  Yes Ivan Anchors Love, PA-C  rivaroxaban (XARELTO) 20 MG TABS tablet Take 1 tablet (20 mg total) by mouth daily. Patient taking differently: Take 20 mg by mouth daily before supper.  02/10/14  Yes Ivan Anchors Love, PA-C  senna (SENOKOT) 8.6 MG TABS tablet Take 2 tablets (17.2 mg total) by mouth daily at 12 noon. Patient taking differently: Take 1 tablet by mouth daily as needed (constipation).  02/10/14  Yes Ivan Anchors Love, PA-C  sertraline (ZOLOFT) 25 MG tablet Take 1 tablet (25 mg total) by mouth daily. 03/03/14  Yes Bernadene Bell, MD  zinc oxide (BALMEX) 11.3 % CREA cream Apply 1 application topically daily. Apply to rectum   Yes Historical Provider, MD  cephALEXin (KEFLEX) 500 MG capsule Take 1 capsule (500 mg total) by mouth  4 (four) times daily. Patient not taking: Reported on 03/11/2014 02/22/14   Leone Haven, MD  fluconazole (DIFLUCAN) 150 MG tablet Take 1 tablet (150 mg total) by mouth once. Patient not taking: Reported on 03/11/2014 02/22/14   Leone Haven, MD  LORazepam (ATIVAN) 0.5 MG tablet Take 1 tablet (0.5 mg total) by mouth 2 (two) times daily as needed for anxiety. Patient not taking: Reported on 03/11/2014 02/10/14   Ivan Anchors Love, PA-C  menthol-cetylpyridinium (CEPACOL) 3 MG lozenge Take 1 lozenge (3 mg total) by mouth as needed for sore throat (sore throat). Patient not taking: Reported on 03/11/2014 01/19/14   Elberta Leatherwood, MD  phenol (CHLORASEPTIC) 1.4 % LIQD Use as directed 1 spray in the mouth or throat  as needed for throat irritation / pain. Patient not taking: Reported on 03/11/2014 01/19/14   Elberta Leatherwood, MD  sucralfate (CARAFATE) 1 GM/10ML suspension Take 10 mLs (1 g total) by mouth 4 (four) times daily -  with meals and at bedtime. Patient not taking: Reported on 03/11/2014 02/10/14   Ivan Anchors Love, PA-C   BP 144/88 mmHg  Pulse 100  Temp(Src) 98.4 F (36.9 C) (Oral)  Resp 20  Ht 5\' 11"  (1.803 m)  Wt 280 lb (127.007 kg)  BMI 39.07 kg/m2  SpO2 99%  LMP 12/02/2013 (Approximate) Physical Exam  Constitutional: She is oriented to person, place, and time. She appears well-developed and well-nourished. No distress.  Awake, alert, nontoxic appearance  HENT:  Head: Normocephalic and atraumatic.  Mouth/Throat: Oropharynx is clear and moist. No oropharyngeal exudate.  Eyes: Conjunctivae are normal. No scleral icterus.  Neck: Normal range of motion. Neck supple.  Cardiovascular: Normal rate, regular rhythm, normal heart sounds and intact distal pulses.   No murmur heard. Pulmonary/Chest: Effort normal and breath sounds normal. No respiratory distress. She has no wheezes.  Equal chest expansion  Abdominal: Soft. Bowel sounds are normal. She exhibits no distension and no mass. There is generalized tenderness. There is guarding. There is no rebound.  Obese Generally tender with guarding throughout; no rebound or peritoneal signs   Musculoskeletal: Normal range of motion. She exhibits no edema.  Neurological: She is alert and oriented to person, place, and time.  Speech is clear and goal oriented Moves extremities without ataxia  Skin: Skin is warm and dry. She is not diaphoretic. No erythema.  Psychiatric: She has a normal mood and affect.  Nursing note and vitals reviewed.   ED Course  Procedures (including critical care time) Labs Review Labs Reviewed  CBC WITH DIFFERENTIAL - Abnormal; Notable for the following:    Neutrophils Relative % 79 (*)    All other components within  normal limits  COMPREHENSIVE METABOLIC PANEL - Abnormal; Notable for the following:    Sodium 134 (*)    Potassium 3.0 (*)    Chloride 94 (*)    Glucose, Bld 128 (*)    Calcium 8.3 (*)    Total Protein 5.9 (*)    Albumin 2.2 (*)    Anion gap 16 (*)    All other components within normal limits  URINALYSIS, ROUTINE W REFLEX MICROSCOPIC - Abnormal; Notable for the following:    Color, Urine AMBER (*)    APPearance CLOUDY (*)    Hgb urine dipstick LARGE (*)    Bilirubin Urine MODERATE (*)    Ketones, ur >80 (*)    Protein, ur 30 (*)    Leukocytes, UA MODERATE (*)    All other  components within normal limits  URINE MICROSCOPIC-ADD ON - Abnormal; Notable for the following:    Squamous Epithelial / LPF FEW (*)    Bacteria, UA FEW (*)    Crystals CA OXALATE CRYSTALS (*)    All other components within normal limits  URINE CULTURE  LIPASE, BLOOD  GI PATHOGEN PANEL BY PCR, STOOL    Imaging Review No results found.   EKG Interpretation None      MDM   Final diagnoses:  Abdominal pain, acute  Abdominal pain, acute   [name] presents with generalized abd pain, N and V.  Pt declines pain medication at this time. Will obtain labs and CT scan to r/o bowel obstruction, colitis, appendicitis, cholecystitis.  Vitals stable.   11:00 PM Labs reassuring.  Pt with UTI which was not treated by keflex as she was not taking this.  Pt given rocephin.   Hypokalemia being treated with IV potassium.  Fluid bolus being given and CT scan pending.  C. Diff pending.    Pt discussed with Charlann Lange, PA-C.    Plan:  If patient has negative CT and is tolerating PO then she may be d/c home but I think this is unlikely and pt will probably need admission.   BP 144/88 mmHg  Pulse 100  Temp(Src) 98.4 F (36.9 C) (Oral)  Resp 20  Ht 5\' 11"  (1.803 m)  Wt 280 lb (127.007 kg)  BMI 39.07 kg/m2  SpO2 99%  LMP 12/02/2013 (Approximate)   [facility], PA-C 03/11/14 1610  Charlesetta Shanks, MD 03/11/14 2309

## 2014-03-11 NOTE — ED Notes (Signed)
Per ems- pt c/o abdominal cramping/pressure with nvd x3 days. Pt has home health nurse- concerned for bowel obstruction. VS stable.

## 2014-03-11 NOTE — ED Notes (Signed)
CT notified that pt is finished with contrast.

## 2014-03-11 NOTE — ED Provider Notes (Signed)
Patient care transferred to me from H. Muthersbaugh, PA-C, at the end of shift.   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 Trying PO ice chips, feels nausea is better.  CT showing sigmoid inflammation - query infectious process but nonspecific.  Discussed with Dr. Claudine Mouton. Discussed with patient and family. She is able to be discharged home appropriately and will follow up with her physicians next week as scheduled. Will change home abx from Keflex to levaquin as she did not tolerate Keflex at home Will provide Zofran to replace home Phenergan as she feels this works better for her Return precautions discussed.  The patient did not receive IV K+ in the emergency department. Will place on PO potassium BID and recommend follow up K+ check in MD's office.  Dewaine Oats, PA-C 03/12/14 1188  Orlie Dakin, MD 03/12/14 1900

## 2014-03-12 MED ORDER — ONDANSETRON HCL 4 MG PO TABS: 4.0000 mg | ORAL_TABLET | Freq: Four times a day (QID) | ORAL | Status: DC

## 2014-03-12 MED ORDER — LEVOFLOXACIN 500 MG PO TABS: 500.0000 mg | ORAL_TABLET | Freq: Every day | ORAL | Status: DC

## 2014-03-12 MED ORDER — POTASSIUM CHLORIDE ER 10 MEQ PO TBCR: 20.0000 meq | EXTENDED_RELEASE_TABLET | Freq: Two times a day (BID) | ORAL | Status: DC

## 2014-03-12 NOTE — Discharge Instructions (Signed)
Nausea and Vomiting °Nausea is a sick feeling that often comes before throwing up (vomiting). Vomiting is a reflex where stomach contents come out of your mouth. Vomiting can cause severe loss of body fluids (dehydration). Children and elderly adults can become dehydrated quickly, especially if they also have diarrhea. Nausea and vomiting are symptoms of a condition or disease. It is important to find the cause of your symptoms. °CAUSES  °· Direct irritation of the stomach lining. This irritation can result from increased acid production (gastroesophageal reflux disease), infection, food poisoning, taking certain medicines (such as nonsteroidal anti-inflammatory drugs), alcohol use, or tobacco use. °· Signals from the brain. These signals could be caused by a headache, heat exposure, an inner ear disturbance, increased pressure in the brain from injury, infection, a tumor, or a concussion, pain, emotional stimulus, or metabolic problems. °· An obstruction in the gastrointestinal tract (bowel obstruction). °· Illnesses such as diabetes, hepatitis, gallbladder problems, appendicitis, kidney problems, cancer, sepsis, atypical symptoms of a heart attack, or eating disorders. °· Medical treatments such as chemotherapy and radiation. °· Receiving medicine that makes you sleep (general anesthetic) during surgery. °DIAGNOSIS °Your caregiver may ask for tests to be done if the problems do not improve after a few days. Tests may also be done if symptoms are severe or if the reason for the nausea and vomiting is not clear. Tests may include: °· Urine tests. °· Blood tests. °· Stool tests. °· Cultures (to look for evidence of infection). °· X-rays or other imaging studies. °Test results can help your caregiver make decisions about treatment or the need for additional tests. °TREATMENT °You need to stay well hydrated. Drink frequently but in small amounts. You may wish to drink water, sports drinks, clear broth, or eat frozen  ice pops or gelatin dessert to help stay hydrated. When you eat, eating slowly may help prevent nausea. There are also some antinausea medicines that may help prevent nausea. °HOME CARE INSTRUCTIONS  °· Take all medicine as directed by your caregiver. °· If you do not have an appetite, do not force yourself to eat. However, you must continue to drink fluids. °· If you have an appetite, eat a normal diet unless your caregiver tells you differently. °¨ Eat a variety of complex carbohydrates (rice, wheat, potatoes, bread), lean meats, yogurt, fruits, and vegetables. °¨ Avoid high-fat foods because they are more difficult to digest. °· Drink enough water and fluids to keep your urine clear or pale yellow. °· If you are dehydrated, ask your caregiver for specific rehydration instructions. Signs of dehydration may include: °¨ Severe thirst. °¨ Dry lips and mouth. °¨ Dizziness. °¨ Dark urine. °¨ Decreasing urine frequency and amount. °¨ Confusion. °¨ Rapid breathing or pulse. °SEEK IMMEDIATE MEDICAL CARE IF:  °· You have blood or brown flecks (like coffee grounds) in your vomit. °· You have black or bloody stools. °· You have a severe headache or stiff neck. °· You are confused. °· You have severe abdominal pain. °· You have chest pain or trouble breathing. °· You do not urinate at least once every 8 hours. °· You develop cold or clammy skin. °· You continue to vomit for longer than 24 to 48 hours. °· You have a fever. °MAKE SURE YOU:  °· Understand these instructions. °· Will watch your condition. °· Will get help right away if you are not doing well or get worse. °Document Released: 03/10/2005 Document Revised: 06/02/2011 Document Reviewed: 08/07/2010 °[employer]® Patient Information ©2015 [employer]. This information is not intended   to replace advice given to you by your health care provider. Make sure you discuss any questions you have with your health care provider. Hypokalemia Hypokalemia means that the amount of  potassium in the blood is lower than normal.Potassium is a chemical, called an electrolyte, that helps regulate the amount of fluid in the body. It also stimulates muscle contraction and helps nerves function properly.Most of the body's potassium is inside of cells, and only a very small amount is in the blood. Because the amount in the blood is so small, minor changes can be life-threatening. CAUSES  Antibiotics.  Diarrhea or vomiting.  Using laxatives too much, which can cause diarrhea.  Chronic kidney disease.  Water pills (diuretics).  Eating disorders (bulimia).  Low magnesium level.  Sweating a lot. SIGNS AND SYMPTOMS  Weakness.  Constipation.  Fatigue.  Muscle cramps.  Mental confusion.  Skipped heartbeats or irregular heartbeat (palpitations).  Tingling or numbness. DIAGNOSIS  Your health care provider can diagnose hypokalemia with blood tests. In addition to checking your potassium level, your health care provider may also check other lab tests. TREATMENT Hypokalemia can be treated with potassium supplements taken by mouth or adjustments in your current medicines. If your potassium level is very low, you may need to get potassium through a vein (IV) and be monitored in the hospital. A diet high in potassium is also helpful. Foods high in potassium are:  Nuts, such as peanuts and pistachios.  Seeds, such as sunflower seeds and pumpkin seeds.  Peas, lentils, and lima beans.  Whole grain and bran cereals and breads.  Fresh fruit and vegetables, such as apricots, avocado, bananas, cantaloupe, kiwi, oranges, tomatoes, asparagus, and potatoes.  Orange and tomato juices.  Red meats.  Fruit yogurt. HOME CARE INSTRUCTIONS  Take all medicines as prescribed by your health care provider.  Maintain a healthy diet by including nutritious food, such as fruits, vegetables, nuts, whole grains, and lean meats.  If you are taking a laxative, be sure to follow the  directions on the label. SEEK MEDICAL CARE IF:  Your weakness gets worse.  You feel your heart pounding or racing.  You are vomiting or having diarrhea.  You are diabetic and having trouble keeping your blood glucose in the normal range. SEEK IMMEDIATE MEDICAL CARE IF:  You have chest pain, shortness of breath, or dizziness.  You are vomiting or having diarrhea for more than 2 days.  You faint. MAKE SURE YOU:   Understand these instructions.  Will watch your condition.  Will get help right away if you are not doing well or get worse. Document Released: 03/10/2005 Document Revised: 12/29/2012 Document Reviewed: 09/10/2012 [facility] Patient Information 2015 [place], [place]. This information is not intended to replace advice given to you by your health care provider. Make sure you discuss any questions you have with your health care provider. Food Choices to Help Relieve Diarrhea When you have diarrhea, the foods you eat and your eating habits are very important. Choosing the right foods and drinks can help relieve diarrhea. Also, because diarrhea can last up to 7 days, you need to replace lost fluids and electrolytes (such as sodium, potassium, and chloride) in order to help prevent dehydration.  WHAT GENERAL GUIDELINES DO I NEED TO FOLLOW?  Slowly drink 1 cup (8 oz) of fluid for each episode of diarrhea. If you are getting enough fluid, your urine will be clear or pale yellow.  Eat starchy foods. Some good choices include white rice, white toast,  pasta, low-fiber cereal, baked potatoes (without the skin), saltine crackers, and bagels.  Avoid large servings of any cooked vegetables.  Limit fruit to two servings per day. A serving is  cup or 1 small piece.  Choose foods with less than 2 g of fiber per serving.  Limit fats to less than 8 tsp (38 g) per day.  Avoid fried foods.  Eat foods that have probiotics in them. Probiotics can be found in certain dairy products.  Avoid  foods and beverages that may increase the speed at which food moves through the stomach and intestines (gastrointestinal tract). Things to avoid include:  High-fiber foods, such as dried fruit, raw fruits and vegetables, nuts, seeds, and whole grain foods.  Spicy foods and high-fat foods.  Foods and beverages sweetened with high-fructose corn syrup, honey, or sugar alcohols such as xylitol, sorbitol, and mannitol. WHAT FOODS ARE RECOMMENDED? Grains White rice. White, [place], or pita breads (fresh or toasted), including plain rolls, buns, or bagels. White pasta. Saltine, soda, or graham crackers. Pretzels. Low-fiber cereal. Cooked cereals made with water (such as cornmeal, farina, or cream cereals). Plain muffins. Matzo. Melba toast. Zwieback.  Vegetables Potatoes (without the skin). Strained tomato and vegetable juices. Most well-cooked and canned vegetables without seeds. Tender lettuce. Fruits Cooked or canned applesauce, apricots, cherries, fruit cocktail, grapefruit, peaches, pears, or plums. Fresh bananas, apples without skin, cherries, grapes, cantaloupe, grapefruit, peaches, oranges, or plums.  Meat and Other Protein Products Baked or boiled chicken. Eggs. Tofu. Fish. Seafood. Smooth peanut butter. Ground or well-cooked tender beef, ham, veal, lamb, pork, or poultry.  Dairy Plain yogurt, kefir, and unsweetened liquid yogurt. Lactose-free milk, buttermilk, or soy milk. Plain hard cheese. Beverages Sport drinks. Clear broths. Diluted fruit juices (except prune). Regular, caffeine-free sodas such as ginger ale. Water. Decaffeinated teas. Oral rehydration solutions. Sugar-free beverages not sweetened with sugar alcohols. Other Bouillon, broth, or soups made from recommended foods.  The items listed above may not be a complete list of recommended foods or beverages. Contact your dietitian for more options. WHAT FOODS ARE NOT RECOMMENDED? Grains Whole grain, whole wheat, bran, or rye  breads, rolls, pastas, crackers, and cereals. Wild or brown rice. Cereals that contain more than 2 g of fiber per serving. Corn tortillas or taco shells. Cooked or dry oatmeal. Granola. Popcorn. Vegetables Raw vegetables. Cabbage, broccoli, Brussels sprouts, artichokes, baked beans, beet greens, corn, kale, legumes, peas, sweet potatoes, and yams. Potato skins. Cooked spinach and cabbage. Fruits Dried fruit, including raisins and dates. Raw fruits. Stewed or dried prunes. Fresh apples with skin, apricots, mangoes, pears, raspberries, and strawberries.  Meat and Other Protein Products Chunky peanut butter. Nuts and seeds. Beans and lentils. Berniece Salines.  Dairy High-fat cheeses. Milk, chocolate milk, and beverages made with milk, such as milk shakes. Cream. Ice cream. Sweets and Desserts Sweet rolls, doughnuts, and sweet breads. Pancakes and waffles. Fats and Oils Butter. Cream sauces. Margarine. Salad oils. Plain salad dressings. Olives. Avocados.  Beverages Caffeinated beverages (such as coffee, tea, soda, or energy drinks). Alcoholic beverages. Fruit juices with pulp. Prune juice. Soft drinks sweetened with high-fructose corn syrup or sugar alcohols. Other Coconut. Hot sauce. Chili powder. Mayonnaise. Gravy. Cream-based or milk-based soups.  The items listed above may not be a complete list of foods and beverages to avoid. Contact your dietitian for more information. WHAT SHOULD I DO IF I BECOME DEHYDRATED? Diarrhea can sometimes lead to dehydration. Signs of dehydration include dark urine and dry mouth and skin. If you  think you are dehydrated, you should rehydrate with an oral rehydration solution. These solutions can be purchased at pharmacies, retail stores, or online.  Drink -1 cup (120-240 mL) of oral rehydration solution each time you have an episode of diarrhea. If drinking this amount makes your diarrhea worse, try drinking smaller amounts more often. For example, drink 1-3 tsp (5-15 mL)  every 5-10 minutes.  A general rule for staying hydrated is to drink 1-2 L of fluid per day. Talk to your health care provider about the specific amount you should be drinking each day. Drink enough fluids to keep your urine clear or pale yellow. Document Released: 05/31/2003 Document Revised: 03/15/2013 Document Reviewed: 01/31/2013 [facility] Patient Information 2015 [place], [place]. This information is not intended to replace advice given to you by your health care provider. Make sure you discuss any questions you have with your health care provider.

## 2014-03-13 LAB — URINE CULTURE
CULTURE: NO GROWTH
Colony Count: NO GROWTH

## 2014-03-14 ENCOUNTER — Telehealth: Payer: Self-pay | Admitting: *Deleted

## 2014-03-14 NOTE — Telephone Encounter (Signed)
Patient sister wanting to know if Medicaid will cover outpatient PT/OT for patient and if so she would like MD to place referral. I told her I thought they did but will send a message to [facility] and PCP to verify.

## 2014-03-14 NOTE — Telephone Encounter (Signed)
Patient sisiter says she called neuro rehab and they could see her they just need an order, will forward to MD to place order.

## 2014-03-15 NOTE — Telephone Encounter (Signed)
I'm not sure whether outpatient PT/OT will be covered by her Medicaid - it depends on how many sessions the patient has already used. Also, she cannot receive home health care (RN, or other therapies) if she is going to receive outpatient services. PCP can place a referral for outpatient services and nurse to send referral to outpatient therapy. They will run pt's insurance to determine eligibility.  Hunt Oris, MSW, [place]

## 2014-03-23 ENCOUNTER — Telehealth: Payer: Self-pay | Admitting: Radiation Oncology

## 2014-03-23 ENCOUNTER — Ambulatory Visit
Admission: RE | Admit: 2014-03-23 | Discharge: 2014-03-23 | Disposition: A | Discharge status: Home / Self Care | Payer: Medicaid Other | Source: Ambulatory Visit | Attending: Radiation Oncology | Admitting: Radiation Oncology

## 2014-03-23 ENCOUNTER — Ambulatory Visit: Payer: Medicaid Other | Admitting: Radiation Oncology

## 2014-03-23 DIAGNOSIS — C7951 Secondary malignant neoplasm of bone: Secondary | ICD-10-CM

## 2014-03-23 NOTE — Telephone Encounter (Signed)
Received call from Sarah [name] that patient cancelled today's appointment due to transportation issues. Patient has rescheduled for 04/06/2014.

## 2014-03-29 ENCOUNTER — Other Ambulatory Visit: Payer: Self-pay | Admitting: Family Medicine

## 2014-03-29 ENCOUNTER — Telehealth: Payer: Self-pay | Admitting: Family Medicine

## 2014-03-29 DIAGNOSIS — G8220 Paraplegia, unspecified: Secondary | ICD-10-CM

## 2014-03-29 NOTE — Telephone Encounter (Signed)
Patient sister informed, expressed understanding.

## 2014-03-29 NOTE — Telephone Encounter (Signed)
Orders were placed

## 2014-03-29 NOTE — Telephone Encounter (Signed)
Checking to see if orders have been placed for pt to receive PT and OT

## 2014-03-31 ENCOUNTER — Encounter: Payer: Medicaid Other | Attending: Physical Medicine & Rehabilitation | Admitting: Physical Medicine & Rehabilitation

## 2014-03-31 ENCOUNTER — Encounter: Payer: Self-pay | Admitting: Physical Medicine & Rehabilitation

## 2014-03-31 VITALS — BP 147/91 | HR 91 | Resp 14

## 2014-03-31 DIAGNOSIS — M6281 Muscle weakness (generalized): Secondary | ICD-10-CM | POA: Diagnosis not present

## 2014-03-31 DIAGNOSIS — R2991 Unspecified symptoms and signs involving the musculoskeletal system: Secondary | ICD-10-CM | POA: Diagnosis not present

## 2014-03-31 DIAGNOSIS — K592 Neurogenic bowel, not elsewhere classified: Secondary | ICD-10-CM

## 2014-03-31 DIAGNOSIS — G839 Paralytic syndrome, unspecified: Secondary | ICD-10-CM | POA: Diagnosis present

## 2014-03-31 DIAGNOSIS — G8220 Paraplegia, unspecified: Secondary | ICD-10-CM

## 2014-03-31 DIAGNOSIS — K5909 Other constipation: Secondary | ICD-10-CM

## 2014-03-31 DIAGNOSIS — C7951 Secondary malignant neoplasm of bone: Secondary | ICD-10-CM

## 2014-03-31 NOTE — Patient Instructions (Signed)
PLEASE CALL ME WITH ANY PROBLEMS OR QUESTIONS (#[phone]).      

## 2014-03-31 NOTE — Progress Notes (Signed)
Subjective:    Patient ID: Sarah [name], female    DOB: 10-Mar-1966, 49 y.o.   MRN: [id number]  HPI   Sarah [name] is here primarily for a w/c evaluation. She is at home with her family. She hasn't received much of any therapy due to medicaid restrictions.   Her current chair is a Air traffic controller" which she was fitted very quickly prior to discharge. She is in need of a new power chair which is appropriately fitted.   She has an ingrown toenail on her right foot.  She has a foley cathether in place. She has been continent moving her bowels every 2-3 days.   From an oncology standpoint, she has follow up with onc, r/o and neurosurgery.   Appetite has been better since she's been home. She off pain meds currently. She takes her baclofen    Pain Inventory Average Pain 2 Pain Right Now 2 My pain is aching  In the last 24 hours, has pain interfered with the following? General activity 0 Relation with others 0 Enjoyment of life 0 What TIME of day is your pain at its worst? daytime Sleep (in general) Fair  Pain is worse with: sitting Pain improves with: medication Relief from Meds: 8  Mobility use a wheelchair  Function disabled: date disabled .  Neuro/Psych bladder control problems bowel control problems numbness tingling spasms depression anxiety  Prior Studies n/a  Physicians involved in your care Any changes since last visit?  yes Primary care Dr. Alease [name] Neurosurgeon Dr. Christella Noa endocrinologist, Dr. Buddy Duty,  Oncologist, Dr. [name]   Family History  Problem Relation Age of Onset  . Hypertension Mother   . Diabetes Mother   . Hypertension Father   . Stroke Father    History   Social History  . Marital Status: Single    Spouse Name: N/A    Number of Children: N/A  . Years of Education: N/A   Social History Main Topics  . Smoking status: Never Smoker   . Smokeless tobacco: Never Used  . Alcohol Use: No  . Drug Use: No  . Sexual Activity: Not Currently    Other Topics Concern  . None   Social History Narrative   Past Surgical History  Procedure Laterality Date  . Tonsillectomy    . Laminectomy N/A 12/14/2013    Procedure: THORACIC LAMINECTOMY FOR TUMOR THORACIC THREE;  Surgeon: Ashok Pall, MD;  Location: [place] [facility];  Service: Neurosurgery;  Laterality: N/A;  THORACIC LAMINECTOMY FOR TUMOR THORACIC THREE  . Posterior lumbar fusion 4 level N/A 12/30/2013    Procedure: Thoracic one-Thoracic five posterior thoracic fusion with pedicle screws;  Surgeon: Ashok Pall, MD;  Location: [place] [facility];  Service: Neurosurgery;  Laterality: N/A;  Thoracic one-Thoracic five posterior thoracic fusion with pedicle screws  . Thyroidectomy N/A 01/09/2014    Procedure: TOTAL THYROIDECTOMY;  Surgeon: Izora Gala, MD;  Location[facility];  Service: ENT;  Laterality: N/A;   Past Medical History  Diagnosis Date  . Hypertension   . DDD (degenerative disc disease), cervical   . DJD (degenerative joint disease)   . Obesity   . Chronic back pain   . 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 History of rectal fissure   . Rotator cuff tendonitis right   BP 147/91 mmHg  Pulse 91  Resp 14  SpO2 98%  LMP 12/02/2013 (Approximate)  Opioid Risk Score:   Fall Risk Score:   Review  of Systems  Genitourinary:       Bladder control  Neurological: Positive for numbness.       Tingling spasms  Psychiatric/Behavioral: Positive for decreased concentration.  All other systems reviewed and are negative.      Objective:   Physical Exam  Constitutional: She is oriented to person, place, and time. She appears well-developed and well-nourished.  HENT:  Head: Normocephalic and atraumatic.  Eyes: Conjunctivae are normal. Pupils are equal, round, and reactive to light.  Neck: Normal range of motion.  Cardiovascular: Normal rate and regular rhythm.  No murmur heard.  Respiratory: Effort normal and breath sounds normal. No  respiratory distress. She has no wheezes. She exhibits no tenderness (sternal area with radiation markings.)  GI: Soft. Bowel sounds are normal. She exhibits no distension. There is no tenderness.  Musculoskeletal: She exhibits no edema and no tenderness.  Neurological: She is alert and oriented to person, place, and time.  Dense paraparesis with flexor spasms and  1-2 beats clonus on the right. Diminished sensation 1/2 below lower/mid chest wall. She cannot sit unsupported Motor strength is 5/5 bilateral deltoids, biceps, triceps, grip  0/5 bilateral hip flexors knee extensors ankle dorsiflexor plantar flexor Tone 1/4 left greater than right LE Skin:ingrown first toe nail   Assessment/Plan: 1. 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  As far as a mobility device is concerned, she is unable to utilize a cane, walker, manual chair or scooter due to her complete  Paraplegia (0/5 strength in bilateral lower extremities) and severe truncal weakness. She is unable to stand or sit without physical support. Furthermore, without a powered wheelchair, Sarah [name] is unable dress, toilet, or bathe without substantial assistance. She requires a custom fit power wheelchair to allow her independent mobility within her home and in the community as well as increased independence with her ADL's. Sarah [name] displays adequate mental capacity and upper extremity strength to safely operate a powered wheelchair. She is willing and motivated to utilize a power wheelchair at home and in the community.   A powered chair with a back which provides adequate trunk support,  custom cushion for pressure relief, elevating leg rests for edema control and spasticity management, and a reclining back for pressure relief would be most appropriate. .    2. Pain Management: continue baclofen to better manage spasticity  -tylenol prn  -off  narcs 3. Skin/Wound Care: right ingrown toenail  -soak, peroxide, neosporin, keep clean and dry 4. Neurogenic bladder: foley for now  5. Neurogenic bowel/GI: off narcs  -suppository q3 days if needed  -sugars reasonable 6. E coli UTI--keflex complete 7. Spasticity: baclofen 10mg  TID---  Follow up with me in 2 months. Thirty minutes of face to face patient care time were spent during this visit. All questions were encouraged and answered.

## 2014-04-06 ENCOUNTER — Ambulatory Visit: Admission: RE | Admit: 2014-04-06 | Payer: Medicaid Other | Source: Ambulatory Visit | Admitting: Radiation Oncology

## 2014-04-06 ENCOUNTER — Ambulatory Visit: Payer: Medicaid Other | Admitting: Radiation Oncology

## 2014-04-10 NOTE — Telephone Encounter (Signed)
Received call from [place] from [place] stating they will not be able to provide service for patient if she starts taking outpt physical therapy.  Medicaid does not cover this at this time.  Suppose to be starting outpt therapy but don't know when.  Will continue in home service until then.  Need verbal order to continue home health until pt start.  Need to see her twice weekly.  Can call back at [phone]

## 2014-04-11 ENCOUNTER — Other Ambulatory Visit: Payer: Self-pay | Admitting: Physical Medicine and Rehabilitation

## 2014-04-12 ENCOUNTER — Other Ambulatory Visit: Payer: Self-pay | Admitting: Family Medicine

## 2014-04-12 NOTE — Telephone Encounter (Signed)
Pt called and needs a refill on her Synthroid called in. She is out. Sarah [name]

## 2014-04-13 MED ORDER — LEVOTHYROXINE SODIUM 125 MCG PO TABS: 250.0000 ug | ORAL_TABLET | Freq: Every day | ORAL | Status: DC

## 2014-04-13 NOTE — Telephone Encounter (Signed)
Call made. Obtained a Advertising account executive for [employer]. Left message. Asked that she call back with any questions/concerns.

## 2014-04-18 ENCOUNTER — Encounter: Payer: Self-pay | Admitting: Physical Therapy

## 2014-04-18 ENCOUNTER — Ambulatory Visit: Payer: Medicaid Other | Attending: Family Medicine | Admitting: Occupational Therapy

## 2014-04-18 ENCOUNTER — Ambulatory Visit: Payer: Medicaid Other | Admitting: Physical Therapy

## 2014-04-18 ENCOUNTER — Encounter: Payer: Self-pay | Admitting: Occupational Therapy

## 2014-04-18 DIAGNOSIS — R2991 Unspecified symptoms and signs involving the musculoskeletal system: Secondary | ICD-10-CM | POA: Insufficient documentation

## 2014-04-18 DIAGNOSIS — M6281 Muscle weakness (generalized): Secondary | ICD-10-CM

## 2014-04-18 DIAGNOSIS — G8220 Paraplegia, unspecified: Secondary | ICD-10-CM

## 2014-04-18 DIAGNOSIS — G839 Paralytic syndrome, unspecified: Secondary | ICD-10-CM | POA: Insufficient documentation

## 2014-04-18 DIAGNOSIS — R29818 Other symptoms and signs involving the nervous system: Secondary | ICD-10-CM

## 2014-04-18 NOTE — Therapy (Signed)
[facility] [address] [place], [place], [address] Phone: [phone]   Fax:  [phone]  Occupational Therapy Evaluation  Patient Details  Name: Sarah [name] MRN: [id number] Date of Birth: 08-May-1965 Referring Provider:  Elberta Leatherwood, MD  Encounter Date: 04/18/2014      OT End of Session - 04/18/14 1207    Visit Number 1   Date for OT Re-Evaluation 06/16/14   Authorization Type medicaid    Authorization Time Period awaiting approval - asking for 12 visits for O.T. and 12 for P.T.    OT Start Time 1110   OT Stop Time 1148   OT Time Calculation (min) 38 min      Past Medical History  Diagnosis Date  . Hypertension   . DDD (degenerative disc disease), cervical   . DJD (degenerative joint disease)   . Obesity   . Chronic back pain   . 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 History of rectal fissure   . Rotator cuff tendonitis right    Past Surgical History  Procedure Laterality Date  . Tonsillectomy    . Laminectomy N/A 12/14/2013    Procedure: THORACIC LAMINECTOMY FOR TUMOR THORACIC THREE;  Surgeon: Ashok Pall, MD;  Location: [facility];  Service: Neurosurgery;  Laterality: N/A;  THORACIC LAMINECTOMY FOR TUMOR THORACIC THREE  . Posterior lumbar fusion 4 level N/A 12/30/2013    Procedure: Thoracic one-Thoracic five posterior thoracic fusion with pedicle screws;  Surgeon: Ashok Pall, MD;  Location: [facility];  Service: Neurosurgery;  Laterality: N/A;  Thoracic one-Thoracic five posterior thoracic fusion with pedicle screws  . Thyroidectomy N/A 01/09/2014    Procedure: TOTAL THYROIDECTOMY;  Surgeon: Izora Gala, MD;  Location: [facility];  Service: ENT;  Laterality: N/A;    There were no vitals taken for this visit.  Visit Diagnosis:  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 - Plan: Ot plan of care cert/re-cert  Muscle weakness (generalized) - Plan: Ot plan of care  cert/re-cert  Poor trunk control - Plan: Ot plan of care cert/re-cert      Subjective Assessment - 04/18/14 1151    Symptoms paraplegia T4 level   Pertinent History s/p laminectomy 12/14/13, s/p lumbar fusion 12/30/13 by Dr. Cyndy Freeze (secondary to tumor)   Patient Stated Goals be more independent and walk again   Currently in Pain? Yes   Pain Score 2    Pain Location Back   Pain Type Chronic pain   Pain Onset More than a month ago   Pain Frequency Intermittent   Aggravating Factors  in one position too long   Pain Relieving Factors changing positions, meds          [facility] OT Assessment - 04/18/14 1122    Assessment   Diagnosis paraplegia T4  level (SCI) s/p laminectomy 12/14/13, lumbar fusion 12/30/13 (secondary to thoracic tumor)  Hospitalized 12/14/13 - 02/10/14   Onset Date 12/14/13   Assessment Pt currently w/c bound using hoyer lift to transfer   Prior Therapy Inpatient rehab 01/19/14 - 02/10/14. Discharged from hospital on 02/10/14   Precautions   Precautions --  wheelchair bound w/ hoyer [place] expects to be discharged to: Private residence   Available Help at Discharge Family  Aide M-F 1:30 - 4:15 to assist w/ ADLS and cleaning   Type of [place] entrance   [place]  One level   Bathroom Accessibility No  unable to enter w/ wheelchair   Home Equipment --  power w/c, hospital bed, hoyer lift   Additional Comments will need bariatric 3-in-1 drop arm BSC.    Lives With --  Sister and brother-n-law. Aides comes 3 hrs M-F   Prior Function   Level of Independence Independent with basic ADLs;Independent with homemaking with ambulation   Vocation Full time employment  At [employer] prior to 12/12/13   ADL   ADL comments Pt performing eating and grooming after set up in wheelchair. Min assist for UB dressing in w/c, min assist for UB bathing in bed. Dependent for LB dressing/bathing which aide does M-F, sister does on  weekends. Pt dependent for toileting using bed pan in bed.    IADL   Prior Level of Function Shopping independent prior to 12/12/13   Shopping Completely unable to shop   Light Housekeeping --  completely dependent - aide or sister performing   Meal Prep --  dependent - sister performing   Prior Level of Function Community Mobility independent prior to 12/12/13   Community Mobility --  wheelchair dependent   Medication Management Is responsible for taking medication in correct dosages at correct time   Mobility   Mobility Status Comments wheelchair dependent, hoyer lift for transfers   Written Expression   Dominant Hand Right   Vision - History   Baseline Vision Wears glasses only for reading   Additional Comments no changes in vision   Observation/Other Assessments   Observations Pt w/ decreased trunk control and unable to sit statically, requires assist EOB. Pt is obese    Sensation   Light Touch Appears Intact  for UE's   Coordination   9 Hole Peg Test (p) Rt = 22.25 sec. Lt = 27.32 sec   Right 9 Hole Peg Test 22.25 sec.    Left 9 Hole Peg Test 27.32 sec.    AROM   Overall AROM  Within functional limits for tasks performed  UE's only   Overall AROM Comments BUE AROM WFL's but decreased trunk control to perform   Strength   Overall Strength Comments Grip strength Rt = 38 lbs, Lt = 45 lbs                         OT Short Term Goals - 04/18/14 1239    OT SHORT TERM GOAL #1   Title Independent w/ HEP for UE strengthening   Baseline dependent   Period --  assess after 3-6 weeks    Status New   OT SHORT TERM GOAL #2   Title Pt to be Modified independence w/ UE dressing in wheelchair   Baseline min assist   Status New   OT SHORT TERM GOAL #3   Title Pt to perform transfer using sliding board w/ max assist x2 in prep for using BSC   Baseline DEPENDENT - using hoyer lift   Status New   OT SHORT TERM GOAL #4   Title Pt to perform long sitiing in bed w/ min  assist in prep for LE ADLS   Status New           OT Long Term Goals - 04/18/14 1251    OT LONG TERM GOAL #1   Title Pt to perform static sitting EOB independently in prep for potential UB self care needs   Baseline requires assist due to trunk control   Time --  assess 6-12 weeks   Status New   OT LONG TERM GOAL #2   Title Pt to perform functional transfer to [facility] using sliding board w/ max assist x 1   Baseline dependent - using hoyer lift   Status New   OT LONG TERM GOAL #3   Title Pt to perform UB bathing in w/c with set up only   Baseline requires min assist in bed   Status New   OT LONG TERM GOAL #4   Title Perform LB bathing and dressing in bed w/ mod assist long sitting   Baseline dependent   Status New               Plan - 04/18/14 1210    Clinical Impression Statement Pt is a 49 y.o. female who presents today w/ paraplegia T4 level SCI s/p laminectomy on 12/14/13 and lumbar fusion 12/30/13 from thoracic tumor. Pt was hospitalized 12/14/13 - 02/10/14. Pt currently wheelchair bound and required hoyer lift to transfer. Pt was independent and working full time prior to 12/12/13 per pt report. Pt would benefit from O.T. to address ADLS, transfers and conditioning    Rehab Potential Good   OT Frequency Other (comment)   OT Duration Other (comment)  REQUESTING 12 VISITS OVER 12 WEEKS FROM MEDICAID   OT Treatment/Interventions Self-care/ADL training;Therapeutic exercise;Neuromuscular education;Moist Heat;Energy conservation;Therapist, nutritional;Therapeutic exercises;Patient/family education;Therapeutic activities;DME and/or AE instruction;Passive range of motion;Manual Therapy   Plan UE HEP w/c level, send order for bariatric drop arm BSC   Consulted and Agree with Plan of Care Patient        Problem List Patient Active Problem List   Diagnosis Date Noted  . GERD (gastroesophageal reflux disease) 02/14/2014  . Dysphagia, pharyngoesophageal phase 02/06/2014   . Type 2 diabetes mellitus without complication 85/63/1497  . Constipation   . Neurogenic bowel   . 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 Metastatic cancer to spine 01/19/2014  . Rotator cuff tendonitis   . Acute postoperative respiratory failure 01/09/2014  . 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 Spine metastasis 12/15/2013  . Leg weakness, bilateral 12/13/2013  . Intractable low back pain 12/10/2013  . Back pain 12/10/2013    Carey Bullocks, OTR/L 04/18/2014, 1:05 PM  [facility] [address] [place], [place], [address] Phone: [phone]   Fax:  [phone]

## 2014-04-19 NOTE — Therapy (Signed)
[facility] [address] [place], [place], [address] Phone: [phone]   Fax:  [phone]  Physical Therapy Evaluation  Patient Details  Name: Sarah [name] MRN: [id number] Date of Birth: 12-Dec-1965 Referring Provider:  Elberta Leatherwood, MD  Encounter Date: 04/18/2014      PT End of Session - 04/19/14 0941    Visit Number 1   Number of Visits 12   Date for PT Re-Evaluation 05/19/14   Authorization Type Medicaid   Authorization Time Period requesting 12 visits over 12 weeks      Past Medical History  Diagnosis Date  . Hypertension   . DDD (degenerative disc disease), cervical   . DJD (degenerative joint disease)   . Obesity   . Chronic back pain   . 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 History of rectal fissure   . Rotator cuff tendonitis right    Past Surgical History  Procedure Laterality Date  . Tonsillectomy    . Laminectomy N/A 12/14/2013    Procedure: THORACIC LAMINECTOMY FOR TUMOR THORACIC THREE;  Surgeon: Ashok Pall, MD;  Location: [facility];  Service: Neurosurgery;  Laterality: N/A;  THORACIC LAMINECTOMY FOR TUMOR THORACIC THREE  . Posterior lumbar fusion 4 level N/A 12/30/2013    Procedure: Thoracic one-Thoracic five posterior thoracic fusion with pedicle screws;  Surgeon: Ashok Pall, MD;  Location: [facility];  Service: Neurosurgery;  Laterality: N/A;  Thoracic one-Thoracic five posterior thoracic fusion with pedicle screws  . Thyroidectomy N/A 01/09/2014    Procedure: TOTAL THYROIDECTOMY;  Surgeon: Izora Gala, MD;  Location: [facility];  Service: ENT;  Laterality: N/A;    There were no vitals taken for this visit.  Visit Diagnosis:  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  Muscle weakness (generalized)  Poor trunk control      Subjective Assessment - 04/18/14 1154    Symptoms Pt. presents with paraplegia due to spinal tumor at T4 level; pt. underwent laminectomy  and stabilization 12-14-13 and then underwent lumbar fusion on 12-30-13; pt. was on inpatient rehab 10-29 until 02-10-14:  hospitalization was 12-12-13 until 02-10-14;  pt. is using a power wheelchair (loaner from [employer])   Currently in Pain? Yes   Pain Score 3    Pain Location Leg   Pain Orientation --  both legs   Pain Descriptors / Indicators Aching;Constant   Pain Type Chronic pain   Pain Onset More than a month ago   Pain Frequency Constant   Aggravating Factors  hanging down   Pain Relieving Factors elevating legs help   Multiple Pain Sites No          [facility] PT Assessment - 04/18/14 1202    Assessment   Medical Diagnosis T4 paraplegia due to spinal tumor   Onset Date 12/12/13   Prior Therapy home health x 1 visit   Precautions   Precautions Fall   Precaution Comments wheelchair bound with hoyer   Balance Screen   Has the patient fallen in the past 6 months No   [place] Private residence   Type of [place] One level   Prior Function   Level of Independence Independent with basic ADLs;Independent with homemaking with ambulation   Vocation Full time employment   PROM   Overall PROM  Within functional limits for tasks performed  No AROM due to paraplegia -  T4 spinal cord level   Transfers   Transfer via Geophysicist/field seismologist --  Market researcher Yes   Wheelchair Assistance 6: Modified independent (Device/Increase time)   Environmental health practitioner Power                            PT Short Term Goals - 04/18/14 1244    PT SHORT TERM GOAL #1   Title Pt. will transfer with sliding board wheelchair to mat with +1 max assist.   Baseline Dependent - pt. is using a hoyer lift for transfers - unable to attempt sliding board at this time at home   Time 4  05-22-14   Period Weeks   Status New   PT SHORT TERM GOAL #2   Title Sit unsupported on side of  mat x 5" with +2 mod assist   Baseline Dependent - pt. is unable to sit unsupported at this time due to decreased trunk musc.   Time 4  05-22-14   Period Weeks   Status New   PT SHORT TERM GOAL #3   Title Independent to direct HEP (stretches and PROM for bil. LE's)   Baseline Dependent   Time 4  05-22-14   Period Weeks   Status New           PT Long Term Goals - 04/18/14 1700    PT LONG TERM GOAL #1   Title Transfer with sliding board with +1 mod assist wheelchair to/from mat   Baseline Pt. requires hoyer lift - unable to use sliding board.     Target date 06-17-14   Time 12   Period Weeks   Status New   PT LONG TERM GOAL #2   Title Sit on side of mat for at least 10" with UE support prn with SBA to incr. independence with dressing, grooming,etc. at home.   Baseline Per pt. report, pt is unable to sit unsupported - unable to assess due to pt. declining to transfer out of wheelchair during initial eval due to not feeling well     target date 06-17-14   Time 12   Period Weeks   Status New   PT LONG TERM GOAL #3   Title Sit unsupported on side of mat with 1 UE support and reach 4" anteriorly with other UE support with min assist to increase independence with ADL's with mod assist   Baseline Dependent   target date 06-17-14   Time 12   Period Weeks   Status New   PT LONG TERM GOAL #4   Title Independent to direct HEP for LE stretching and PROM   Baseline Dependent   target date 06-17-14   Time 12   Period Weeks   Status New               Plan - 04/18/14 1238    Clinical Impression Statement Pt. presents with paraplegia in bil. LE's with no AROM with spasms noted resulting in pt. c/o pain discomfort due to spasms; pt. is using a hoyer lift for transfers - requested not to transfer out  of wheelhciar at time of PT eval due to not feeling well enough today to attempt   Pt will benefit from skilled therapeutic intervention in order to improve on the following deficits  Decreased mobility;Obesity;Decreased range of motion;Impaired flexibility;Increased muscle spasms;Decreased balance;Decreased strength   Rehab Potential Fair   PT Frequency  2x / week   PT Duration 12 weeks   PT Treatment/Interventions ADLs/Self Care Home Management;Therapeutic activities;Patient/family education;Therapeutic exercise;Wheelchair mobility training;Balance training;Neuromuscular re-education;Functional mobility training   PT Next Visit Plan sitting balance / transfer training with sliding board as appropriate   Consulted and Agree with Plan of Care Patient         Problem List Patient Active Problem List   Diagnosis Date Noted  . GERD (gastroesophageal reflux disease) 02/14/2014  . Dysphagia, pharyngoesophageal phase 02/06/2014  . Type 2 diabetes mellitus without complication 69/45/0388  . Constipation   . Neurogenic bowel   . 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 Metastatic cancer to spine 01/19/2014  . Rotator cuff tendonitis   . Acute postoperative respiratory failure 01/09/2014  . 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 Spine metastasis 12/15/2013  . Leg weakness, bilateral 12/13/2013  . Intractable low back pain 12/10/2013  . Back pain 12/10/2013    Alda Lea, PT 04/19/2014, 9:44 AM  [facility] [address] [place], [place], [address] Phone: [phone]   Fax:  [phone]

## 2014-04-20 ENCOUNTER — Encounter: Payer: Self-pay | Admitting: Radiation Oncology

## 2014-04-20 ENCOUNTER — Ambulatory Visit
Admission: RE | Admit: 2014-04-20 | Discharge: 2014-04-20 | Disposition: A | Discharge status: Home / Self Care | Payer: Medicaid Other | Source: Ambulatory Visit | Attending: Radiation Oncology | Admitting: Radiation Oncology

## 2014-04-20 VITALS — BP 146/104 | HR 108 | Resp 16 | Wt 280.0 lb

## 2014-04-20 DIAGNOSIS — C7951 Secondary malignant neoplasm of bone: Secondary | ICD-10-CM

## 2014-04-20 NOTE — Progress Notes (Signed)
Radiation Oncology         [phone] ________________________________  Name: Sarah [name] MRN: [id number]  Date: 04/20/2014  DOB: 1965/06/08  Follow-Up Visit Note  CC: McKeag, Marylynn Pearson, MD  Ashok Pall, MD  Diagnosis:   49 yo woman s/p laminectomy and stabilization for spinal cord compression at T3 from metastatic thyroid Hurthle cell tumor. - Stage T4a N0 M1 - IVC s/p Palliative radiation 01/18/2014-01/31/2014 to the spine from C7 to T6 inclusive to 30 Gy in 10 fractions    ICD-9-CM ICD-10-CM   1. Spine metastasis 198.5 C79.51     Interval Since Last Radiation:  2  months  Narrative:  The patient returns today for routine follow-up.  Reports constant tingling and aching in both lower extremities. Reports pain in her bottom from "sitting in her chair so long." Helped patient reposition. Reports burning pain at urinary catheter site. Had PT eval yesterday. Waiting to hear back about frequency of PT and plan. Reports having regularly form bowel movement using a bed pain. Requesting refill of Zofran. Reports intermittent nausea. Living with sister. Reports night sweats. Denies headache or dizziness.                              ALLERGIES:  is allergic to keflex and tramadol.  Meds: Current Outpatient Prescriptions  Medication Sig Dispense Refill  . acetaminophen (TYLENOL) 325 MG tablet Take 2 tablets (650 mg total) by mouth every 4 (four) hours as needed (mild pain).    . baclofen (LIORESAL) 10 MG tablet Take 1 tablet (10 mg total) by mouth 3 (three) times daily. 90 each 1  . levothyroxine (SYNTHROID, LEVOTHROID) 125 MCG tablet Take 2 tablets (250 mcg total) by mouth daily before breakfast. 60 tablet 1  . rivaroxaban (XARELTO) 20 MG TABS tablet Take 1 tablet (20 mg total) by mouth daily. (Patient taking differently: Take 20 mg by mouth daily before supper. ) 60 tablet 1  . cephALEXin (KEFLEX) 500 MG capsule Take 1 capsule (500 mg total) by mouth 4 (four) times daily. (Patient not  taking: Reported on 04/20/2014) 28 capsule 0  . fluconazole (DIFLUCAN) 150 MG tablet Take 1 tablet (150 mg total) by mouth once. (Patient not taking: Reported on 04/18/2014) 1 tablet 0  . Glycerin, Adult, 2.1 G SUPP Place 1 suppository rectally daily after supper. (Patient not taking: Reported on 04/20/2014) 30 suppository 1  . HYDROcodone-acetaminophen (NORCO/VICODIN) 5-325 MG per tablet Take 1-2 tablets by mouth every 6 (six) hours as needed for severe pain. (Patient not taking: Reported on 04/20/2014) 100 tablet 0  . KLOR-CON M10 10 MEQ tablet Take 20 mEq by mouth 2 (two) times daily.  0  . levofloxacin (LEVAQUIN) 500 MG tablet Take 1 tablet (500 mg total) by mouth daily. (Patient not taking: Reported on 04/18/2014) 7 tablet 0  . LORazepam (ATIVAN) 0.5 MG tablet Take 1 tablet (0.5 mg total) by mouth 2 (two) times daily as needed for anxiety. (Patient not taking: Reported on 04/18/2014) 30 tablet 0  . menthol-cetylpyridinium (CEPACOL) 3 MG lozenge Take 1 lozenge (3 mg total) by mouth as needed for sore throat (sore throat). (Patient not taking: Reported on 04/18/2014) 100 tablet 12  . ondansetron (ZOFRAN) 4 MG tablet Take 1 tablet (4 mg total) by mouth every 6 (six) hours. (Patient not taking: Reported on 04/20/2014) 12 tablet 0  . pantoprazole (PROTONIX) 40 MG tablet Take 1 tablet (40 mg total) by mouth  daily. (Patient not taking: Reported on 04/20/2014) 30 tablet 3  . phenol (CHLORASEPTIC) 1.4 % LIQD Use as directed 1 spray in the mouth or throat as needed for throat irritation / pain. (Patient not taking: Reported on 04/18/2014)  0  . polyethylene glycol (MIRALAX / GLYCOLAX) packet Take 17 g by mouth daily. (Patient not taking: Reported on 04/20/2014) 30 each 1  . potassium chloride (K-DUR) 10 MEQ tablet Take 2 tablets (20 mEq total) by mouth 2 (two) times daily. (Patient not taking: Reported on 04/18/2014) 12 tablet 0  . promethazine (PHENERGAN) 25 MG tablet Take 1 tablet (25 mg total) by mouth every 6 (six)  hours as needed for nausea or vomiting. (Patient not taking: Reported on 04/20/2014) 60 tablet 1  . senna (SENOKOT) 8.6 MG TABS tablet Take 2 tablets (17.2 mg total) by mouth daily at 12 noon. (Patient not taking: Reported on 04/20/2014) 120 each 0  . sertraline (ZOLOFT) 25 MG tablet Take 1 tablet (25 mg total) by mouth daily. (Patient not taking: Reported on 04/20/2014) 30 tablet 3  . sucralfate (CARAFATE) 1 GM/10ML suspension Take 10 mLs (1 g total) by mouth 4 (four) times daily -  with meals and at bedtime. (Patient not taking: Reported on 04/18/2014) 420 mL 1  . zinc oxide (BALMEX) 11.3 % CREA cream Apply 1 application topically daily. Apply to rectum     No current facility-administered medications for this encounter.    Physical Findings: The patient is in no acute distress. [name] alert and oriented.  weight is 280 lb (127.007 kg). Her blood pressure is 146/104 and her pulse is 108. Her respiration is 16. .  She remains paralyzed.  No significant changes.  Lab Findings: Lab Results  Component Value Date   WBC 6.0 03/11/2014   HGB 12.1 03/11/2014   HCT 36.4 03/11/2014   MCV 85.4 03/11/2014   PLT 326 03/11/2014    Impression:  The patient is recovering from the effects of radiation.  She will need to see an endocrinologist.  Plan:  See Dr. Buddy Duty from endocrine in the next few weeks.  _____________________________________  [name] [name], M.D.

## 2014-04-20 NOTE — Progress Notes (Signed)
Reports constant tingling and aching in both lower extremities. Reports pain in her bottom from "sitting in her chair so long." Helped patient reposition. Reports burning pain at urinary catheter site. Had PT eval yesterday. Waiting to hear back about frequency of PT and plan. Reports having regularly form bowel movement using a bed pain. Requesting refill of Zofran. Reports intermittent nausea. Living with sister. Reports night sweats. Denies headache or dizziness.

## 2014-04-24 ENCOUNTER — Other Ambulatory Visit: Payer: Self-pay | Admitting: Family Medicine

## 2014-04-24 ENCOUNTER — Telehealth: Payer: Self-pay | Admitting: Family Medicine

## 2014-04-24 MED ORDER — LEVOFLOXACIN 500 MG PO TABS: 500.0000 mg | ORAL_TABLET | Freq: Every day | ORAL | Status: DC

## 2014-04-24 NOTE — Telephone Encounter (Signed)
Rise Paganini informed of message from MD, she will contact patient.

## 2014-04-24 NOTE — Telephone Encounter (Signed)
[facility]: Pt says her urine is thick and cloudy, burns Needs antibotic for UTI [facility] in [place] made her sick Levofloxacin 500mg  worked well the last time Would like for this to be done today

## 2014-04-24 NOTE — Telephone Encounter (Signed)
Called Sarah [name] w/ [facility] -- left message. I would like to see patient w/ my own eyes for issues like these, however I understand that this patient has a difficulty mobilizing so this may not be possible.  I went ahead and reordered Levaquin x7days. I asked that she be seen (hopefully by me) in the very near future. UA and Urine culture would be beneficial to r/o any resistant pathogens.   Thank you.

## 2014-04-25 ENCOUNTER — Telehealth: Payer: Self-pay | Admitting: Family Medicine

## 2014-04-25 MED ORDER — LEVOFLOXACIN 500 MG PO TABS: 500.0000 mg | ORAL_TABLET | Freq: Every day | ORAL | Status: DC

## 2014-04-25 NOTE — Telephone Encounter (Signed)
RX for Levaquin needs to be called in to [facility] in [place] Her BP is 150/100.  Does Dr wants to start her on something? [phone]

## 2014-04-25 NOTE — Telephone Encounter (Signed)
Rx for Levaquin resent to pharmacy (looks like the initial rx was printed). Will forward to MD regarding BP.

## 2014-04-26 NOTE — Telephone Encounter (Signed)
I apologize for the prescription issue. As for the BP, I would like to see her in the clinic before any medication is started. She has has some consistently elevated BP recently so I very well may start her on one soon.

## 2014-04-27 NOTE — Telephone Encounter (Signed)
Called and left message on voicemail with message from MD.

## 2014-05-01 ENCOUNTER — Ambulatory Visit (INDEPENDENT_AMBULATORY_CARE_PROVIDER_SITE_OTHER): Payer: Medicaid Other | Admitting: Family Medicine

## 2014-05-01 ENCOUNTER — Encounter: Payer: Self-pay | Admitting: Family Medicine

## 2014-05-01 VITALS — BP 139/100 | HR 106 | Temp 97.8°F

## 2014-05-01 DIAGNOSIS — K592 Neurogenic bowel, not elsewhere classified: Secondary | ICD-10-CM

## 2014-05-01 DIAGNOSIS — E876 Hypokalemia: Secondary | ICD-10-CM

## 2014-05-01 DIAGNOSIS — I159 Secondary hypertension, unspecified: Secondary | ICD-10-CM

## 2014-05-01 DIAGNOSIS — L600 Ingrowing nail: Secondary | ICD-10-CM | POA: Insufficient documentation

## 2014-05-01 DIAGNOSIS — I10 Essential (primary) hypertension: Secondary | ICD-10-CM

## 2014-05-01 LAB — BASIC METABOLIC PANEL WITH GFR
BUN: 13 mg/dL (ref 6–23)
CO2: 25 meq/L (ref 19–32)
Calcium: 8.4 mg/dL (ref 8.4–10.5)
Chloride: 102 mEq/L (ref 96–112)
Creat: 0.73 mg/dL (ref 0.50–1.10)
Glucose, Bld: 131 mg/dL — ABNORMAL HIGH (ref 70–99)
Potassium: 4.2 mEq/L (ref 3.5–5.3)
Sodium: 137 mEq/L (ref 135–145)

## 2014-05-01 MED ORDER — POTASSIUM CHLORIDE ER 10 MEQ PO TBCR: 10.0000 meq | EXTENDED_RELEASE_TABLET | Freq: Every day | ORAL | Status: DC

## 2014-05-01 MED ORDER — LISINOPRIL 5 MG PO TABS: 5.0000 mg | ORAL_TABLET | Freq: Every day | ORAL | Status: DC

## 2014-05-01 NOTE — Progress Notes (Signed)
HPI  Patient presents today for medication check. Patient has been progressing well. She has experienced some issues w/ UTIs recently but understands that the foley catheter places her at an increased risk for this. She reports she gets much of her care from her sister. She is checked for pressure ulcers regularly and has not had any issues yet. She inquired about the possibility of a yeast infection. No reports of discharge. Only itching. Patient has now been treated twice for UTI in the past 4 months. I informed her that yogurt, cottage cheese, and OTC probiotics can help prevent these. For now, no need to investigate until she has been fully treated for the current UTI.   [name] no longer taking the prescribed Zoloft. She states she no longer feels the need to take it. Her mood has been relatively stable and she feels good about her ability to continue w/o this medication.   Denies recent fever, chills, n/v/d/c, HA, SOB, CP, abdom pain. She continues to have no movement or sensation in her lower extremities.   Smoking status noted (non smoker) ROS: Per HPI  Objective: BP 139/100 mmHg  Pulse 106  Temp(Src) 97.8 F (36.6 C) (Oral) Gen: NAD, alert, cooperative with exam HEENT: NCAT, EOMI, PERRL CV: RRR, good S1/S2, no murmur Resp: CTABL, no wheezes, non-labored Abd: SNTND, BS present, no organomegaly. Foley catheter in place Ext: No edema, warm, pulses present, no ulceration appreciated.  Neuro: Alert and oriented, no sensation or movement beyond T4 level.   Assessment and plan:  Neurogenic bowel Patient feels as though she may be gaining sensation back in her urinary bladder. She states that she occasionally "feels the urge to go" even w/ the catheter in place. I informed her that considering she is not incontinent fecally that she may have a chance to have urinary control. I informed patient that if she felt comfortable with a trial of going w/o the foley catheter than she may. I  advised that she wait until it is time to change the currently placed catheter and to give the trial a full 24hrs w/ anticipation that if the trial fails she will have a very uncomfortable 24hrs and once completed she needs to ensure she is fully dry if catheter is to be placed again. Patient and mother stated their understanding.    Hypertension Patient has had some increased BPs over the past few visits. Asymptomatic at this time.  Starting Lisinopril. Patient had taken this while in the hospital and tolerated it well.   Future: BP recheck in 2-4wks   Ingrown right big toenail Patient has noticed a development of an ingrown toenail on her right great toe. lateral aspect had evidence of prior bleeding. Skin appeared to have evidence of desquamation likely 2/2 infective process. Lateral aspect erythematous. Cap refill present. Infection is improving according to patient. No evidence of erythema beyond great toe.  - patient is currently on Levaquin for UTI -- which should also help this (has 3 days left) - informed patient to perform daily soaks in warm water. Instructed to test water temp first w/ arm for 30sec due to paraplegia. Soak for 15-31min 1-2x a day. Make sure leg is allowed to completely dry after soaks due to increased risk of ulceration.    Future: if toenail has not improved by 2-4weeks, i will see her for a partial removal.   Hypokalemia Some vague complaints of muscle cramping. Last K = 3.0  Start K-Dur 63meq QD  Orders Placed This Encounter  Procedures  . BASIC METABOLIC PANEL WITH GFR    Meds ordered this encounter  Medications  . lisinopril (PRINIVIL,ZESTRIL) 5 MG tablet    Sig: Take 1 tablet (5 mg total) by mouth daily.    Dispense:  90 tablet    Refill:  0  . potassium chloride (K-DUR) 10 MEQ tablet    Sig: Take 1 tablet (10 mEq total) by mouth daily.    Dispense:  30 tablet    Refill:  0     Elberta Leatherwood, MD,MS,  PGY1 05/01/2014 8:38 PM

## 2014-05-01 NOTE — Assessment & Plan Note (Signed)
Some vague complaints of muscle cramping. Last K = 3.0  Start K-Dur 58meq QD

## 2014-05-01 NOTE — Assessment & Plan Note (Signed)
Patient feels as though she may be gaining sensation back in her urinary bladder. She states that she occasionally "feels the urge to go" even w/ the catheter in place. I informed her that considering she is not incontinent fecally that she may have a chance to have urinary control. I informed patient that if she felt comfortable with a trial of going w/o the foley catheter than she may. I advised that she wait until it is time to change the currently placed catheter and to give the trial a full 24hrs w/ anticipation that if the trial fails she will have a very uncomfortable 24hrs and once completed she needs to ensure she is fully dry if catheter is to be placed again. Patient and mother stated their understanding.

## 2014-05-01 NOTE — Patient Instructions (Signed)
It was a pleasure seeing you today in our clinic. Today we discussed your high blood pressure, toenail, and recent UTIs. Here is the treatment plan we have discussed and agreed upon together:   - Continue taking your Levaquin until completion. Try to incorporate yogurt and/or, she is to your daily diet in order to help prevent UTIs in the future. - I started you on 5 mg of lisinopril every day. This should help decrease her blood pressure which has gradually been increasing over the past 3 months. - I have also ordered have a potassium supplement added to your medication regimen. You are to take 1 pill every day. - I strongly encourage soaking the affected ingrown toenail in a bucket of warm water every night for approximately 15-20 minutes. This may significantly improved status of your ingrown toenail and maybe prevent the need for any surgical intervention. - I would like to see you back in 2-4 weeks to recheck your blood pressure as well as to recheck your ingrown toenail. If your toenail has not improved by that time I will be willing to remove part of the toenail at that visit.

## 2014-05-01 NOTE — Assessment & Plan Note (Signed)
Patient has noticed a development of an ingrown toenail on her right great toe. lateral aspect had evidence of prior bleeding. Skin appeared to have evidence of desquamation likely 2/2 infective process. Lateral aspect erythematous. Cap refill present. Infection is improving according to patient. No evidence of erythema beyond great toe.  - patient is currently on Levaquin for UTI -- which should also help this (has 3 days left) - informed patient to perform daily soaks in warm water. Instructed to test water temp first w/ arm for 30sec due to paraplegia. Soak for 15-69min 1-2x a day. Make sure leg is allowed to completely dry after soaks due to increased risk of ulceration.    Future: if toenail has not improved by 2-4weeks, i will see her for a partial removal.

## 2014-05-01 NOTE — Assessment & Plan Note (Signed)
Patient has had some increased BPs over the past few visits. Asymptomatic at this time.  Starting Lisinopril. Patient had taken this while in the hospital and tolerated it well.   Future: BP recheck in 2-4wks

## 2014-05-05 ENCOUNTER — Encounter: Payer: Self-pay | Admitting: *Deleted

## 2014-05-05 NOTE — [facility] Oncology Navigator Note (Unsigned)
I spoke with Dr. Julien Nordmann regarding patient having chemotherapy.  He stated due to dx patient needs to be seen by head and neck oncology.  I notified Kim to schedule.

## 2014-05-14 ENCOUNTER — Other Ambulatory Visit: Payer: Self-pay | Admitting: Oncology

## 2014-05-14 NOTE — Progress Notes (Unsigned)
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

## 2014-05-15 ENCOUNTER — Telehealth: Payer: Self-pay | Admitting: Oncology

## 2014-05-15 ENCOUNTER — Other Ambulatory Visit: Payer: Self-pay | Admitting: *Deleted

## 2014-05-15 ENCOUNTER — Ambulatory Visit: Payer: Medicaid Other | Admitting: Family Medicine

## 2014-05-16 ENCOUNTER — Telehealth: Payer: Self-pay | Admitting: Oncology

## 2014-05-16 NOTE — Telephone Encounter (Signed)
Chart delivered 05/16/14 °

## 2014-05-22 ENCOUNTER — Ambulatory Visit: Payer: BLUE CROSS/BLUE SHIELD | Admitting: Occupational Therapy

## 2014-05-22 ENCOUNTER — Ambulatory Visit: Payer: BLUE CROSS/BLUE SHIELD | Admitting: Physical Therapy

## 2014-05-25 ENCOUNTER — Ambulatory Visit: Payer: Medicaid Other | Attending: Family Medicine | Admitting: Occupational Therapy

## 2014-05-25 ENCOUNTER — Encounter: Payer: Self-pay | Admitting: Occupational Therapy

## 2014-05-25 ENCOUNTER — Ambulatory Visit: Payer: Medicaid Other | Admitting: Physical Therapy

## 2014-05-25 DIAGNOSIS — G839 Paralytic syndrome, unspecified: Secondary | ICD-10-CM | POA: Diagnosis present

## 2014-05-25 DIAGNOSIS — M6281 Muscle weakness (generalized): Secondary | ICD-10-CM | POA: Diagnosis not present

## 2014-05-25 DIAGNOSIS — G8220 Paraplegia, unspecified: Secondary | ICD-10-CM

## 2014-05-25 DIAGNOSIS — R2991 Unspecified symptoms and signs involving the musculoskeletal system: Secondary | ICD-10-CM | POA: Diagnosis not present

## 2014-05-25 DIAGNOSIS — R29818 Other symptoms and signs involving the nervous system: Secondary | ICD-10-CM

## 2014-05-25 NOTE — Therapy (Signed)
[facility] [address] [place], [place], [address] Phone: [phone]   Fax:  [phone]  Patient Details  Name: Sarah [name] MRN: [id number] Date of Birth: 06-27-1965 Referring Provider:  Elberta Leatherwood, MD  Encounter Date: 05/25/2014  Pt. Arrived for OP PT - hoyer lift not currently at this clinic - has been taken to [facility] - pt. Only has 6 visits authorized from [facility] - does not want to use a visit  Without being able to transfer to mat table (unable to do safely without hoyer lift)  Atlantis Delong, [place], PT 05/25/2014, 10:34 AM  [facility] [address] [place], [place], [address] Phone: [phone]   Fax:  [phone]

## 2014-05-25 NOTE — Therapy (Signed)
[facility] [address] [place], [place], [address] Phone: [phone]   Fax:  [phone]  Occupational Therapy Treatment  Patient Details  Name: Sarah [name] MRN: [id number] Date of Birth: 01-Oct-1965 Referring Provider:  Elberta Leatherwood, MD  Encounter Date: 05/25/2014      OT End of Session - 05/25/14 1204    Visit Number 2   Number of Visits 13   Date for OT Re-Evaluation 06/16/14   Authorization Type medicaid    Authorization Time Period 04/21/14-4/21/6   Authorization - Visit Number 1   Authorization - Number of Visits 12   OT Start Time 1105   OT Stop Time 1145   OT Time Calculation (min) 40 min   Activity Tolerance Patient tolerated treatment well      Past Medical History  Diagnosis Date  . Hypertension   . DDD (degenerative disc disease), cervical   . DJD (degenerative joint disease)   . Obesity   . Chronic back pain   . 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 History of rectal fissure   . Rotator cuff tendonitis right    Past Surgical History  Procedure Laterality Date  . Tonsillectomy    . Laminectomy N/A 12/14/2013    Procedure: THORACIC LAMINECTOMY FOR TUMOR THORACIC THREE;  Surgeon: Ashok Pall, MD;  Location: [facility];  Service: Neurosurgery;  Laterality: N/A;  THORACIC LAMINECTOMY FOR TUMOR THORACIC THREE  . Posterior lumbar fusion 4 level N/A 12/30/2013    Procedure: Thoracic one-Thoracic five posterior thoracic fusion with pedicle screws;  Surgeon: Ashok Pall, MD;  Location: [facility];  Service: Neurosurgery;  Laterality: N/A;  Thoracic one-Thoracic five posterior thoracic fusion with pedicle screws  . Thyroidectomy N/A 01/09/2014    Procedure: TOTAL THYROIDECTOMY;  Surgeon: Izora Gala, MD;  Location: [facility];  Service: ENT;  Laterality: N/A;    There were no vitals taken for this visit.  Visit Diagnosis:  Muscle weakness (generalized)  Poor  trunk control      Subjective Assessment - 05/25/14 1106    Symptoms trying to do some exercises at home, catheter out now   Currently in Pain? Yes   Pain Score 3    Pain Location Back  and buttocks   Pain Descriptors / Indicators Constant   Aggravating Factors  nothing   Pain Relieving Factors nothing                 OT Treatments/Exercises (OP) - 05/25/14 0001    ADLs   Overall ADLs Pt needed to be recline for weighted UE exercises due to pain in buttocks (for pressure relief)  Pt unable to perform pressure relief independently.   UB Dressing Pt declined to practice donning/doffing shirt.   Neurological Re-education Exercises   Shoulder Flexion AAROM;Left;Seated  with cane with min cueing   Shoulder ABduction Strengthening;Right;15 reps;Seated  with 1lb wt   Elbow Extension Strengthening   Forearm Supination 15 reps;Both;Seated  chest press with 2lb weight and overhead press with 2lb wt.   Other Exercises 1 Forward wt. shift away from back of w/c with mod cues for scapular retraction and lift head.  with min facilitation with UE support and max facilitation without UE support (place and hold) for core activitation                OT Education - 05/25/14 1200    Education provided Yes  Education Details yellow theraband HEP   Person(s) Educated Patient   Methods Explanation;Demonstration;Verbal cues;Handout   Comprehension Verbalized understanding;Returned demonstration          OT Short Term Goals - 04/18/14 1239    OT SHORT TERM GOAL #1   Title Independent w/ HEP for UE strengthening   Baseline dependent   Period --  assess after 3-6 weeks    Status New   OT SHORT TERM GOAL #2   Title Pt to be Modified independence w/ UE dressing in wheelchair   Baseline min assist   Status New   OT SHORT TERM GOAL #3   Title Pt to perform transfer using sliding board w/ max assist x2 in prep for using BSC   Baseline DEPENDENT - using hoyer lift   Status  New   OT SHORT TERM GOAL #4   Title Pt to perform long sitiing in bed w/ min assist in prep for LE ADLS   Status New           OT Long Term Goals - 04/18/14 1251    OT LONG TERM GOAL #1   Title Pt to perform static sitting EOB independently in prep for potential UB self care needs   Baseline requires assist due to trunk control   Time --  assess 6-12 weeks   Status New   OT LONG TERM GOAL #2   Title Pt to perform functional transfer to [facility] using sliding board w/ max assist x 1   Baseline dependent - using hoyer lift   Status New   OT LONG TERM GOAL #3   Title Pt to perform UB bathing in w/c with set up only   Baseline requires min assist in bed   Status New   OT LONG TERM GOAL #4   Title Perform LB bathing and dressing in bed w/ mod assist long sitting   Baseline dependent   Status New               Plan - 05/25/14 1206    Clinical Impression Statement Pt limited by decreased trunk control/severity of deficits.  Hoyer lift unavailable today to transfer today and pt declined to work on [place].   Rehab Potential Good   OT Frequency Other (comment)   OT Duration Other (comment)  REQUESTING 12 VISITS OVER 12 WEEKS FROM MEDICAID   OT Treatment/Interventions Self-care/ADL training;Therapeutic exercise;Neuromuscular education;Moist Heat;Energy conservation;Therapist, nutritional;Therapeutic exercises;Patient/family education;Therapeutic activities;DME and/or AE instruction;Passive range of motion;Manual Therapy   Plan ?order for bariatric drop arm BSC, trunk control   Consulted and Agree with Plan of Care Patient        Problem List Patient Active Problem List   Diagnosis Date Noted  . Hypertension 05/01/2014  . Ingrown right big toenail 05/01/2014  . Hypokalemia 05/01/2014  . GERD (gastroesophageal reflux disease) 02/14/2014  . Dysphagia, pharyngoesophageal phase 02/06/2014  . Type 2 diabetes mellitus without complication 84/16/6063  . Constipation    . Neurogenic bowel   . 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 Metastatic cancer to spine 01/19/2014  . Rotator cuff tendonitis   . Acute postoperative respiratory failure 01/09/2014  . 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 Spine metastasis 12/15/2013  . Leg weakness, bilateral 12/13/2013  . Intractable low back pain 12/10/2013  . Back pain 12/10/2013    [facility] 05/25/2014, 12:09 PM  [facility] [address]  [place], [place], [address] Phone: [phone]   Fax:  [place], OTR/L 05/25/2014 12:09 PM

## 2014-05-25 NOTE — Patient Instructions (Signed)
Strengthening: Resisted Flexion   Attach tube to door.  Hold tubing with one arm at side. Pull forward and up. Move shoulder through pain-free range of motion. Repeat 15 times per set.  Do 1-2 sessions per day.    Strengthening: Resisted Extension   Attach one end to door.  Hold tubing in one hand, arm forward. Pull arm back, elbow straight. Repeat 15 times per set. Do 1-2 sessions per day.   Resisted Horizontal Abduction: Bilateral   Sit or stand, tubing in both hands, arms out in front. Keeping arms straight, pinch shoulder blades together and stretch arms out. Repeat 15 times per set.  Do 1-2 sessions per day.   Elbow Flexion: Resisted   Hold tubing wrapped around  One hand and and other end held against knee curl arm up as far as possible. Repeat 15 times per set.  Do 1-2 sessions per day.    Elbow Extension: Resisted   Hold band in left hand with elbow bent. Straighten  other elbow. Repeat 15 times per set.  Do 1-2 sessions per day.   **REPEAT ALL WITH BOTH ARMS.

## 2014-05-26 ENCOUNTER — Encounter: Payer: Self-pay | Admitting: Physical Medicine & Rehabilitation

## 2014-05-26 ENCOUNTER — Encounter: Payer: Medicaid Other | Attending: Physical Medicine & Rehabilitation | Admitting: Physical Medicine & Rehabilitation

## 2014-05-26 VITALS — BP 135/70 | HR 93 | Resp 14

## 2014-05-26 DIAGNOSIS — R2991 Unspecified symptoms and signs involving the musculoskeletal system: Secondary | ICD-10-CM | POA: Diagnosis not present

## 2014-05-26 DIAGNOSIS — G839 Paralytic syndrome, unspecified: Secondary | ICD-10-CM | POA: Insufficient documentation

## 2014-05-26 DIAGNOSIS — M6281 Muscle weakness (generalized): Secondary | ICD-10-CM | POA: Diagnosis not present

## 2014-05-26 DIAGNOSIS — G8220 Paraplegia, unspecified: Secondary | ICD-10-CM

## 2014-05-26 DIAGNOSIS — C7951 Secondary malignant neoplasm of bone: Secondary | ICD-10-CM

## 2014-05-26 NOTE — Progress Notes (Signed)
Subjective:    Patient ID: Sarah [name], female    DOB: Oct 13, 1965, 49 y.o.   MRN: [id number]  HPI   Sarah [name] is here in follow up of her SCI and associate spastic tetraplegia. She is spontaneously voiding now but is largely incontinent as she doesn't have much warning that she's going to empty. She typically empties 3 x per day in large amounts. She is continent of bowels and is rarely needing a suppository now. .   For pain she's using tylenol. She's also on 10mg  Baclfoen 5x per day which was increased last month by NS.  She has started working in [employer]. The focus will be on transfers, tone, core strengthening, self-care.   Rad-Onc has discharged her, and she'll be following up with Endocrinology for further recs as she has an estrogen dependent tumor.    Pain Inventory Average Pain 3 Pain Right Now 3 My pain is burning, tingling and aching  In the last 24 hours, has pain interfered with the following? General activity 0 Relation with others 0 Enjoyment of life 0 What TIME of day is your pain at its worst? morning and daytime Sleep (in general) NA  Pain is worse with: no pain Pain improves with: rest Relief from Meds: no pain  Mobility use a wheelchair needs help with transfers  Function disabled: date disabled . I need assistance with the following:  dressing, bathing, toileting and meal prep  Neuro/Psych bladder control problems bowel control problems numbness tingling spasms  Prior Studies Any changes since last visit?  no  Physicians involved in your care Any changes since last visit?  no   Family History  Problem Relation Age of Onset  . Hypertension Mother   . Diabetes Mother   . Hypertension Father   . Stroke Father    History   Social History  . Marital Status: Single    Spouse Name: N/A  . Number of Children: N/A  . Years of Education: N/A   Social History Main Topics  . Smoking status: Never Smoker   . Smokeless tobacco: Never  Used  . Alcohol Use: No  . Drug Use: No  . Sexual Activity: Not Currently   Other Topics Concern  . None   Social History Narrative   Past Surgical History  Procedure Laterality Date  . Tonsillectomy    . Laminectomy N/A 12/14/2013    Procedure: THORACIC LAMINECTOMY FOR TUMOR THORACIC THREE;  Surgeon: Ashok Pall, MD;  Location: [facility];  Service: Neurosurgery;  Laterality: N/A;  THORACIC LAMINECTOMY FOR TUMOR THORACIC THREE  . Posterior lumbar fusion 4 level N/A 12/30/2013    Procedure: Thoracic one-Thoracic five posterior thoracic fusion with pedicle screws;  Surgeon: Ashok Pall, MD;  Location: [facility];  Service: Neurosurgery;  Laterality: N/A;  Thoracic one-Thoracic five posterior thoracic fusion with pedicle screws  . Thyroidectomy N/A 01/09/2014    Procedure: TOTAL THYROIDECTOMY;  Surgeon: Izora Gala, MD;  Location: [facility];  Service: ENT;  Laterality: N/A;   Past Medical History  Diagnosis Date  . Hypertension   . DDD (degenerative disc disease), cervical   . DJD (degenerative joint disease)   . Obesity   . Chronic back pain   . 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 History of rectal fissure   . Rotator cuff tendonitis right   BP 135/70 mmHg  Pulse 93  Resp 14  SpO2 100%  Opioid Risk Score:  Fall Risk Score: Low Fall Risk (0-5 points)    Review of Systems  Constitutional: Positive for diaphoresis.  Gastrointestinal:       Bowel Control Problems  Genitourinary:       Bladder control problems  Musculoskeletal:       Spasms  Neurological: Positive for numbness.       Tingling  All other systems reviewed and are negative.      Objective:   Physical Exam  Constitutional: She is oriented to person, place, and time. She appears well-developed and well-nourished.  HENT:  Head: Normocephalic and atraumatic.  Eyes: Conjunctivae are normal. Pupils are equal, round, and reactive to light.  Neck: Normal range of  motion.  Cardiovascular: Normal rate and regular rhythm.  No murmur heard.  Respiratory: Effort normal and breath sounds normal. No respiratory distress. She has no wheezes. She exhibits no tenderness (sternal area with radiation markings.)  GI: Soft. Bowel sounds are normal. She exhibits no distension. There is no tenderness.  Musculoskeletal: She exhibits no edema and no tenderness.  Neurological: She is alert and oriented to person, place, and time.  Dense paraparesis with improved flexor spasms--minimal clonus today.. Diminished sensation 1/2 below lower/mid chest wall. She cannot sit unsupported  Motor strength is 5/5 bilateral deltoids, biceps, triceps, grip  tr/5 bilateral hip flexors, hip add/hip abd, 0 knee extensors, trace knee flexion, 0/5 ankle dorsiflexor plantar flexors  Tone 1/4 left hip adductors, 1/4 gastrocs/hamstrings Skin: toe nail stable Psych: pleasant, in good spirits    Assessment/Plan:  1. 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  -still awaiting customized wheelchair -endocrine follow up pending  2. Pain Management: continue baclofen to better manage spasticity  -tylenol prn  -increase baclofen to 20mg  qid, pending a phone call from patient about her current dose. 3. Skin/Wound Care: right ingrown toenail  -care per PCP 4. Neurogenic bladder: timed voids q4-5 x per day to avoid excessive bladder volume and pressure 5. Neurogenic bowel/GI:   -suppository if needed  6. Continue with therapies per neuro rehab. Standing is not realistic at this point---focus should be on transfers, core strengthening, self-care, tone 7. Spasticity: baclofen as above  -  Follow up with me in 2 months. Thirty minutes of face to face patient care time were spent during this visit. All questions were encouraged and answered.

## 2014-05-26 NOTE — Patient Instructions (Signed)
CALL ME WITH BACLOFEN DOSING AND I'LL INCREASE   STRIVE FOR AT LEAST 4-5 TIME BLADDER EMPTYINGS (VOIDS) DAILY   PLEASE CALL ME WITH ANY PROBLEMS OR QUESTIONS (#[phone]).

## 2014-05-30 ENCOUNTER — Ambulatory Visit: Payer: Medicaid Other | Admitting: Physical Therapy

## 2014-05-30 ENCOUNTER — Encounter: Payer: Self-pay | Admitting: Physical Therapy

## 2014-05-30 ENCOUNTER — Encounter: Payer: Self-pay | Admitting: Occupational Therapy

## 2014-05-30 ENCOUNTER — Ambulatory Visit: Payer: Medicaid Other | Admitting: Occupational Therapy

## 2014-05-30 DIAGNOSIS — G839 Paralytic syndrome, unspecified: Secondary | ICD-10-CM | POA: Diagnosis not present

## 2014-05-30 DIAGNOSIS — M6281 Muscle weakness (generalized): Secondary | ICD-10-CM

## 2014-05-30 DIAGNOSIS — R29818 Other symptoms and signs involving the nervous system: Secondary | ICD-10-CM

## 2014-05-30 DIAGNOSIS — R2991 Unspecified symptoms and signs involving the musculoskeletal system: Secondary | ICD-10-CM

## 2014-05-30 NOTE — Therapy (Signed)
[facility] [address] [place], [place], [address] Phone: [phone]   Fax:  [phone]  Occupational Therapy Treatment  Patient Details  Name: Sarah [name] MRN: [id number] Date of Birth: [birth year] Referring Provider:  Elberta Leatherwood, MD  Encounter Date: 05/30/2014      OT End of Session - 05/30/14 1422    Visit Number 3   Number of Visits 13   Date for OT Re-Evaluation 06/16/14   Authorization Type medicaid    Authorization Time Period 04/21/14-4/21/6   Authorization - Visit Number 2   Authorization - Number of Visits 12   OT Start Time 1100   OT Stop Time 1140   OT Time Calculation (min) 40 min   Activity Tolerance Patient tolerated treatment well      Past Medical History  Diagnosis Date  . Hypertension   . DDD (degenerative disc disease), cervical   . DJD (degenerative joint disease)   . Obesity   . Chronic back pain   . 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 History of rectal fissure   . Rotator cuff tendonitis right    Past Surgical History  Procedure Laterality Date  . Tonsillectomy    . Laminectomy N/A 12/14/2013    Procedure: THORACIC LAMINECTOMY FOR TUMOR THORACIC THREE;  Surgeon: Ashok Pall, MD;  Location: [facility];  Service: Neurosurgery;  Laterality: N/A;  THORACIC LAMINECTOMY FOR TUMOR THORACIC THREE  . Posterior lumbar fusion 4 level N/A 12/30/2013    Procedure: Thoracic one-Thoracic five posterior thoracic fusion with pedicle screws;  Surgeon: Ashok Pall, MD;  Location: [facility];  Service: Neurosurgery;  Laterality: N/A;  Thoracic one-Thoracic five posterior thoracic fusion with pedicle screws  . Thyroidectomy N/A 01/09/2014    Procedure: TOTAL THYROIDECTOMY;  Surgeon: Izora Gala, MD;  Location: [facility];  Service: ENT;  Laterality: N/A;    There were no vitals taken for this visit.  Visit Diagnosis:  Poor trunk control  Muscle weakness  (generalized)      Subjective Assessment - 05/30/14 1122    Pertinent History s/p laminectomy 12/14/13, s/p lumbar fusion 12/30/13 by Dr. Cyndy Freeze (secondary to tumor)   Patient Stated Goals be more independent and walk again   Currently in Pain? Yes  4/10 At  [facility] - O.T. not addressing secondary to outside scope of practice. Caregiver regularly checks for pressure sores, pt denies any pressure sores                 OT Treatments/Exercises (OP) - 05/30/14 1412    ADLs   UB Dressing Pt doffed shirt in w/c independently. Pt donned shirt in w/c with min assist to pull down in back due to lack of  trunk control    Exercises   Exercises Shoulder   Shoulder Exercises: ROM/Strengthening   Other ROM/Strengthening Exercises Reviewed theraband HEP previously issued. Pt return demo x 10 reps each with initial min cues for clarification on proper ex and positioning   Neurological Re-education Exercises   Other Exercises 1 forward wt. shift away from back of w/c for core activation, followed by disengaging UE (alternating one at a time) for upward, forward, ipsilateral side, and backward reaching. Pt still required one hand support due to lack of trunk control                   OT Short Term Goals - 04/18/14 1239  OT SHORT TERM GOAL #1   Title Independent w/ HEP for UE strengthening   Baseline dependent   Period --  assess after 3-6 weeks    Status New   OT SHORT TERM GOAL #2   Title Pt to be Modified independence w/ UE dressing in wheelchair   Baseline min assist   Status New   OT SHORT TERM GOAL #3   Title Pt to perform transfer using sliding board w/ max assist x2 in prep for using BSC   Baseline DEPENDENT - using hoyer lift   Status New   OT SHORT TERM GOAL #4   Title Pt to perform long sitiing in bed w/ min assist in prep for LE ADLS   Status New           OT Long Term Goals - 04/18/14 1251    OT LONG TERM GOAL #1   Title Pt to perform static sitting EOB  independently in prep for potential UB self care needs   Baseline requires assist due to trunk control   Time --  assess 6-12 weeks   Status New   OT LONG TERM GOAL #2   Title Pt to perform functional transfer to [facility] using sliding board w/ max assist x 1   Baseline dependent - using hoyer lift   Status New   OT LONG TERM GOAL #3   Title Pt to perform UB bathing in w/c with set up only   Baseline requires min assist in bed   Status New   OT LONG TERM GOAL #4   Title Perform LB bathing and dressing in bed w/ mod assist long sitting   Baseline dependent   Status New               Plan - 05/30/14 1423    Clinical Impression Statement Pt approximating STG's #1 and #2. Pt limited by poor trunk control, obesity and severity of LE/trunk deficits.    Plan continue trunk control as able. ? if able to sit EOM with assist x2         Problem List Patient Active Problem List   Diagnosis Date Noted  . Hypertension 05/01/2014  . Ingrown right big toenail 05/01/2014  . Hypokalemia 05/01/2014  . GERD (gastroesophageal reflux disease) 02/14/2014  . Dysphagia, pharyngoesophageal phase 02/06/2014  . Type 2 diabetes mellitus without complication 52/77/8242  . Constipation   . Neurogenic bowel   . 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 Metastatic cancer to spine 01/19/2014  . Rotator cuff tendonitis   . Acute postoperative respiratory failure 01/09/2014  . 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 Spine metastasis 12/15/2013  . Leg weakness, bilateral 12/13/2013  . Intractable low back pain 12/10/2013  . Back pain 12/10/2013    Carey Bullocks, OTR/L 05/30/2014, 2:27 PM  [facility] [address] [place], [place], [address] Phone: [phone]   Fax:  [phone]

## 2014-05-31 ENCOUNTER — Ambulatory Visit: Payer: Medicaid Other | Admitting: Occupational Therapy

## 2014-05-31 ENCOUNTER — Ambulatory Visit: Payer: Medicaid Other | Admitting: Physical Therapy

## 2014-05-31 NOTE — Therapy (Signed)
[facility] [address] [place], [place], [address] Phone: [phone]   Fax:  [phone]  Physical Therapy Treatment  Patient Details  Name: Sarah [name] MRN: [id number] Date of Birth: 10/26/1965 Referring Provider:  Elberta Leatherwood, MD  Encounter Date: 05/30/2014      PT End of Session - 05/31/14 0740    Visit Number 2  visit 1/6 authorized   Number of Visits 6   Date for PT Re-Evaluation 06/17/14   Authorization Type Medicaid   Authorization Time Period 1-29  - 07-13-14   Authorization - Visit Number 1   Authorization - Number of Visits 6  thru 07-13-14   PT Start Time 1150   PT Stop Time 1233   PT Time Calculation (min) 43 min      Past Medical History  Diagnosis Date  . Hypertension   . DDD (degenerative disc disease), cervical   . DJD (degenerative joint disease)   . Obesity   . Chronic back pain   . 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 History of rectal fissure   . Rotator cuff tendonitis right    Past Surgical History  Procedure Laterality Date  . Tonsillectomy    . Laminectomy N/A 12/14/2013    Procedure: THORACIC LAMINECTOMY FOR TUMOR THORACIC THREE;  Surgeon: Ashok Pall, MD;  Location: [facility];  Service: Neurosurgery;  Laterality: N/A;  THORACIC LAMINECTOMY FOR TUMOR THORACIC THREE  . Posterior lumbar fusion 4 level N/A 12/30/2013    Procedure: Thoracic one-Thoracic five posterior thoracic fusion with pedicle screws;  Surgeon: Ashok Pall, MD;  Location: [facility];  Service: Neurosurgery;  Laterality: N/A;  Thoracic one-Thoracic five posterior thoracic fusion with pedicle screws  . Thyroidectomy N/A 01/09/2014    Procedure: TOTAL THYROIDECTOMY;  Surgeon: Izora Gala, MD;  Location: [facility];  Service: ENT;  Laterality: N/A;    There were no vitals taken for this visit.  Visit Diagnosis:  Muscle weakness (generalized)      Subjective Assessment  - 05/31/14 0744    Symptoms Pt. states back feels tight and uncomfortable due to prolonged seated position and inability to move due to paraplegia; pt. states she was able to move her legs together a little a few days ago     Pt. was transferred from wheelchair to mat with hoyer lift and back into wheelchair with hoyer lift  Neuro Re-ed:  Sitting balance unsupported on side of mat with bil. UE support; pt. Performed alternate UE flexion but required At least 1 UE support on mat to maintain sitting balance; pt. unable to sit unsupported without UE support Stretching - pushing blue swiss ball forward for low back stretching Scapular retraction in sitting position x 10 reps; trunk rotation in unsupported sitting x 5 reps each side  Pt. Reclined back on wedge and 4 pillows - attempted trunk/abdominal flexion x 5 reps but very uncomfortable for pt                      PT Short Term Goals - 04/18/14 1244    PT SHORT TERM GOAL #1   Title Pt. will transfer with sliding board wheelchair to mat with +1 max assist.   Baseline Dependent - pt. is using a hoyer lift for transfers - unable to attempt sliding board at this time at home   Time 4  05-22-14   Period Weeks  Status New   PT SHORT TERM GOAL #2   Title Sit unsupported on side of mat x 5" with +2 mod assist   Baseline Dependent - pt. is unable to sit unsupported at this time due to decreased trunk musc.   Time 4  05-22-14   Period Weeks   Status New   PT SHORT TERM GOAL #3   Title Independent to direct HEP (stretches and PROM for bil. LE's)   Baseline Dependent   Time 4  05-22-14   Period Weeks   Status New           PT Long Term Goals - 04/18/14 1700    PT LONG TERM GOAL #1   Title Transfer with sliding board with +1 mod assist wheelchair to/from mat   Baseline Pt. requires hoyer lift - unable to use sliding board.     Target date 06-17-14   Time 12   Period Weeks   Status New   PT LONG TERM GOAL #2   Title  Sit on side of mat for at least 10" with UE support prn with SBA to incr. independence with dressing, grooming,etc. at home.   Baseline Per pt. report, pt is unable to sit unsupported - unable to assess due to pt. declining to transfer out of wheelchair during initial eval due to not feeling well     target date 06-17-14   Time 12   Period Weeks   Status New   PT LONG TERM GOAL #3   Title Sit unsupported on side of mat with 1 UE support and reach 4" anteriorly with other UE support with min assist to increase independence with ADL's with mod assist   Baseline Dependent   target date 06-17-14   Time 12   Period Weeks   Status New   PT LONG TERM GOAL #4   Title Independent to direct HEP for LE stretching and PROM   Baseline Dependent   target date 06-17-14   Time 12   Period Weeks   Status New               Plan - 05/31/14 0744    Clinical Impression Statement Pt. had moderate to severe discomfort with reclining back in unsupported sitting on side of mat (with trunk extension) due to stretching achieved in this position - pt. states she had not sat unsupported like that since many months ago   Pt will benefit from skilled therapeutic intervention in order to improve on the following deficits Decreased mobility;Obesity;Decreased range of motion;Impaired flexibility;Increased muscle spasms;Decreased balance;Decreased strength   Rehab Potential Fair   PT Frequency 2x / week   PT Duration 12 weeks   PT Treatment/Interventions ADLs/Self Care Home Management;Therapeutic activities;Patient/family education;Therapeutic exercise;Wheelchair mobility training;Balance training;Neuromuscular re-education;Functional mobility training   PT Next Visit Plan sitting balance / transfer training with sliding board as appropriate   Consulted and Agree with Plan of Care Patient        Problem List Patient Active Problem List   Diagnosis Date Noted  . Hypertension 05/01/2014  . Ingrown right big  toenail 05/01/2014  . Hypokalemia 05/01/2014  . GERD (gastroesophageal reflux disease) 02/14/2014  . Dysphagia, pharyngoesophageal phase 02/06/2014  . Type 2 diabetes mellitus without complication 32/95/1884  . Constipation   . Neurogenic bowel   . 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 Metastatic cancer to spine 01/19/2014  . Rotator cuff tendonitis   . Acute postoperative respiratory failure 01/09/2014  . Hurthle cell  neoplasm of thyroid 12/30/2013  . 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 Spine metastasis 12/15/2013  . Leg weakness, bilateral 12/13/2013  . Intractable low back pain 12/10/2013  . Back pain 12/10/2013    Alda Lea, PT 05/31/2014, 7:51 AM  [facility] [address] [place], [place], [address] Phone: [phone]   Fax:  [phone]

## 2014-06-05 ENCOUNTER — Telehealth: Payer: Self-pay | Admitting: *Deleted

## 2014-06-05 NOTE — Telephone Encounter (Signed)
Patient called saying someone left a message that she has an appointment here on 06/06/14.  She does not know why she is coming her or who referred her.  Transferred the HIM, new patient scheduling.  She said that she could not arrange transportation for tomorrow on this short a notice.

## 2014-06-06 ENCOUNTER — Ambulatory Visit: Payer: Medicaid Other | Admitting: Oncology

## 2014-06-06 ENCOUNTER — Other Ambulatory Visit: Payer: Medicaid Other

## 2014-06-06 ENCOUNTER — Ambulatory Visit: Payer: Medicaid Other

## 2014-06-07 ENCOUNTER — Ambulatory Visit: Payer: Medicaid Other | Admitting: Physical Therapy

## 2014-06-08 ENCOUNTER — Telehealth: Payer: Self-pay | Admitting: *Deleted

## 2014-06-08 NOTE — Telephone Encounter (Signed)
On 06-08-14 fax  Medical records to [employer] It was consult note, sim & planning note,end of tx note, follow up note., procedure note

## 2014-06-12 ENCOUNTER — Ambulatory Visit: Payer: Medicaid Other | Admitting: Occupational Therapy

## 2014-06-12 ENCOUNTER — Telehealth: Payer: Self-pay | Admitting: Family Medicine

## 2014-06-12 NOTE — Telephone Encounter (Signed)
Needs the commode to be the bigger one where the arm of it lifts / Fonda Kinder, ASA

## 2014-06-12 NOTE — Telephone Encounter (Signed)
Pt needs a bedside commode and a slide board Medicaid runs out this month. Trying to get it ordered before it runs out Please advise

## 2014-06-13 ENCOUNTER — Ambulatory Visit: Payer: Medicaid Other | Admitting: Physical Therapy

## 2014-06-13 ENCOUNTER — Other Ambulatory Visit: Payer: Self-pay | Admitting: Family Medicine

## 2014-06-13 DIAGNOSIS — R29818 Other symptoms and signs involving the nervous system: Secondary | ICD-10-CM

## 2014-06-13 DIAGNOSIS — R2991 Unspecified symptoms and signs involving the musculoskeletal system: Secondary | ICD-10-CM

## 2014-06-13 DIAGNOSIS — G8220 Paraplegia, unspecified: Secondary | ICD-10-CM

## 2014-06-13 DIAGNOSIS — M6281 Muscle weakness (generalized): Secondary | ICD-10-CM

## 2014-06-13 DIAGNOSIS — G839 Paralytic syndrome, unspecified: Secondary | ICD-10-CM | POA: Diagnosis not present

## 2014-06-13 NOTE — Telephone Encounter (Signed)
Spoke with patient sister, orders and patient demographics faxed to [employer].

## 2014-06-13 NOTE — Telephone Encounter (Signed)
Ordered Engineer, maintenance (IT). Please call to ask if these orders are what the patient needs. Printed orders are up front.  Thanks!

## 2014-06-14 ENCOUNTER — Encounter: Payer: Self-pay | Admitting: Physical Therapy

## 2014-06-14 ENCOUNTER — Telehealth: Payer: Self-pay | Admitting: Family Medicine

## 2014-06-14 NOTE — Telephone Encounter (Signed)
The commode that was prescribed was the regular size commode, when patient actually needs the bigger sized commode with a drop down arm. Please call pt when fixed / Fonda Kinder, ASA

## 2014-06-14 NOTE — Therapy (Signed)
[facility] [address] [place], [place], [address] Phone: [phone]   Fax:  [phone]  Physical Therapy Treatment  Patient Details  Name: Sarah [name] MRN: [id number] Date of Birth: 12-19-1965 Referring Provider:  Elberta Leatherwood, MD  Encounter Date: 06/13/2014      PT End of Session - 06/14/14 0942    Visit Number 3   Number of Visits 6   Date for PT Re-Evaluation 06/17/14   Authorization Type Medicaid   Authorization Time Period 1-29  - 07-13-14   Authorization - Visit Number 2   Authorization - Number of Visits 6  thru 07-13-14   PT Start Time 1018   PT Stop Time 1105   PT Time Calculation (min) 47 min      Past Medical History  Diagnosis Date  . Hypertension   . DDD (degenerative disc disease), cervical   . DJD (degenerative joint disease)   . Obesity   . Chronic back pain   . 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 History of rectal fissure   . Rotator cuff tendonitis right    Past Surgical History  Procedure Laterality Date  . Tonsillectomy    . Laminectomy N/A 12/14/2013    Procedure: THORACIC LAMINECTOMY FOR TUMOR THORACIC THREE;  Surgeon: Ashok Pall, MD;  Location: [facility];  Service: Neurosurgery;  Laterality: N/A;  THORACIC LAMINECTOMY FOR TUMOR THORACIC THREE  . Posterior lumbar fusion 4 level N/A 12/30/2013    Procedure: Thoracic one-Thoracic five posterior thoracic fusion with pedicle screws;  Surgeon: Ashok Pall, MD;  Location: [facility];  Service: Neurosurgery;  Laterality: N/A;  Thoracic one-Thoracic five posterior thoracic fusion with pedicle screws  . Thyroidectomy N/A 01/09/2014    Procedure: TOTAL THYROIDECTOMY;  Surgeon: Izora Gala, MD;  Location: [facility];  Service: ENT;  Laterality: N/A;    There were no vitals filed for this visit.  Visit Diagnosis:  Muscle weakness (generalized)  Poor trunk control  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       Subjective Assessment - 06/14/14 0938    Symptoms Pt. reports no sitting unsupported at home - stays in wheelchair most of time; states she is beginning to get some movement return in her legs - able to move legs together a litttle in seated position   Patient Stated Goals Improve mobility   Currently in Pain? No/denies                            Balance Exercises - 06/14/14 0941    Balance Exercises: Seated   Static Sitting Upper extremity support - 2;Upper extremity support - 1;No upper extremity support;Eyes opened   Dynamic Sitting Anterior/posterior weight shift   Balance Exercises: Seated   Static Sitting Time 15     Pt. Was transferred from wheelchair to mat with hoyer lift; worked on unsupported sitting balance with 1 or 2 UE support Prn; some balance performed without UE support; trunk flexion and extension with chair behind x 10 reps each with mod Assist for flexion; pt. Performed sitting to 1/2 sitting on forearm x 5 reps to each side with return to upright with min assist; trunk  Rotation x 5 reps to right and left sides with CGA to min assist        PT Short Term Goals - 04/18/14 1244    PT SHORT TERM GOAL #1  Title Pt. will transfer with sliding board wheelchair to mat with +1 max assist.   Baseline Dependent - pt. is using a hoyer lift for transfers - unable to attempt sliding board at this time at home   Time 4  05-22-14   Period Weeks   Status New   PT SHORT TERM GOAL #2   Title Sit unsupported on side of mat x 5" with +2 mod assist   Baseline Dependent - pt. is unable to sit unsupported at this time due to decreased trunk musc.   Time 4  05-22-14   Period Weeks   Status New   PT SHORT TERM GOAL #3   Title Independent to direct HEP (stretches and PROM for bil. LE's)   Baseline Dependent   Time 4  05-22-14   Period Weeks   Status New           PT Long Term Goals - 04/18/14 1700    PT LONG TERM GOAL #1   Title Transfer with  sliding board with +1 mod assist wheelchair to/from mat   Baseline Pt. requires hoyer lift - unable to use sliding board.     Target date 06-17-14   Time 12   Period Weeks   Status New   PT LONG TERM GOAL #2   Title Sit on side of mat for at least 10" with UE support prn with SBA to incr. independence with dressing, grooming,etc. at home.   Baseline Per pt. report, pt is unable to sit unsupported - unable to assess due to pt. declining to transfer out of wheelchair during initial eval due to not feeling well     target date 06-17-14   Time 12   Period Weeks   Status New   PT LONG TERM GOAL #3   Title Sit unsupported on side of mat with 1 UE support and reach 4" anteriorly with other UE support with min assist to increase independence with ADL's with mod assist   Baseline Dependent   target date 06-17-14   Time 12   Period Weeks   Status New   PT LONG TERM GOAL #4   Title Independent to direct HEP for LE stretching and PROM   Baseline Dependent   target date 06-17-14   Time [place] - 06/14/14 0943    Clinical Impression Statement Pt. had less c/o discomfort/pain during transfer with hoyer lift today - demonstrating improvement and incr. tolerance with unsupported sitting; pt. able to actively adduct LE 's in seated position   Pt will benefit from skilled therapeutic intervention in order to improve on the following deficits Decreased mobility;Obesity;Decreased range of motion;Impaired flexibility;Increased muscle spasms;Decreased balance;Decreased strength   Rehab Potential Fair   PT Frequency 2x / week   PT Duration 12 weeks   PT Treatment/Interventions ADLs/Self Care Home Management;Therapeutic activities;Patient/family education;Therapeutic exercise;Wheelchair mobility training;Balance training;Neuromuscular re-education;Functional mobility training   PT Next Visit Plan sitting balance / trunk strengthening; use hoyer lift for transfer    Consulted and Agree with Plan of Care Patient        Problem List Patient Active Problem List   Diagnosis Date Noted  . Hypertension 05/01/2014  . Ingrown right big toenail 05/01/2014  . Hypokalemia 05/01/2014  . GERD (gastroesophageal reflux disease) 02/14/2014  . Dysphagia, pharyngoesophageal phase 02/06/2014  . Type 2 diabetes mellitus without complication 73/22/0254  .  Constipation   . Neurogenic bowel   . 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 Metastatic cancer to spine 01/19/2014  . Rotator cuff tendonitis   . Acute postoperative respiratory failure 01/09/2014  . 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 Spine metastasis 12/15/2013  . Leg weakness, bilateral 12/13/2013  . Intractable low back pain 12/10/2013  . Back pain 12/10/2013    Alda Lea, PT 06/14/2014, 9:47 AM  [facility] [address] [place], [place], [address] Phone: [phone]   Fax:  [phone]

## 2014-06-15 ENCOUNTER — Ambulatory Visit: Payer: Medicaid Other | Admitting: Occupational Therapy

## 2014-06-15 ENCOUNTER — Other Ambulatory Visit: Payer: Self-pay | Admitting: Physical Medicine and Rehabilitation

## 2014-06-15 ENCOUNTER — Ambulatory Visit: Payer: Medicaid Other | Admitting: Physical Therapy

## 2014-06-15 ENCOUNTER — Other Ambulatory Visit: Payer: Self-pay | Admitting: Family Medicine

## 2014-06-15 DIAGNOSIS — R2991 Unspecified symptoms and signs involving the musculoskeletal system: Secondary | ICD-10-CM

## 2014-06-15 DIAGNOSIS — G839 Paralytic syndrome, unspecified: Secondary | ICD-10-CM | POA: Diagnosis not present

## 2014-06-15 DIAGNOSIS — R29818 Other symptoms and signs involving the nervous system: Secondary | ICD-10-CM

## 2014-06-15 DIAGNOSIS — M6281 Muscle weakness (generalized): Secondary | ICD-10-CM

## 2014-06-15 NOTE — Therapy (Signed)
[facility] [address] [place], [place], [address] Phone: [phone]   Fax:  [phone]  Occupational Therapy Treatment  Patient Details  Name: Sarah [name] MRN: [id number] Date of Birth: Jul 19, 1965 Referring Provider:  Elberta Leatherwood, MD  Encounter Date: 06/15/2014      OT End of Session - 06/15/14 1431    Visit Number 4   Number of Visits 13   Date for OT Re-Evaluation 06/16/14   Authorization Type medicaid    Authorization Time Period 04/21/14-4/21/6   Authorization - Visit Number 3   Authorization - Number of Visits 12   OT Start Time 1320   OT Stop Time 1410   OT Time Calculation (min) 50 min   Activity Tolerance Patient tolerated treatment well      Past Medical History  Diagnosis Date  . Hypertension   . DDD (degenerative disc disease), cervical   . DJD (degenerative joint disease)   . Obesity   . Chronic back pain   . 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 History of rectal fissure   . Rotator cuff tendonitis right    Past Surgical History  Procedure Laterality Date  . Tonsillectomy    . Laminectomy N/A 12/14/2013    Procedure: THORACIC LAMINECTOMY FOR TUMOR THORACIC THREE;  Surgeon: Ashok Pall, MD;  Location: [facility];  Service: Neurosurgery;  Laterality: N/A;  THORACIC LAMINECTOMY FOR TUMOR THORACIC THREE  . Posterior lumbar fusion 4 level N/A 12/30/2013    Procedure: Thoracic one-Thoracic five posterior thoracic fusion with pedicle screws;  Surgeon: Ashok Pall, MD;  Location: [facility];  Service: Neurosurgery;  Laterality: N/A;  Thoracic one-Thoracic five posterior thoracic fusion with pedicle screws  . Thyroidectomy N/A 01/09/2014    Procedure: TOTAL THYROIDECTOMY;  Surgeon: Izora Gala, MD;  Location: [facility];  Service: ENT;  Laterality: N/A;    There were no vitals filed for this visit.  Visit Diagnosis:  Poor trunk control  Muscle  weakness (generalized)      Subjective Assessment - 06/15/14 1421    Symptoms My back hurts going from sitting to reclining and back up (with transitional movements)   Pertinent History s/p laminectomy 12/14/13, s/p lumbar fusion 12/30/13 by Dr. Cyndy Freeze (secondary to tumor)   Patient Stated Goals be more independent and walk again   Currently in Pain? Yes  back - O.T. not directly addressing secondary to outside scope of practice   Pain Location Back                    OT Treatments/Exercises (OP) - 06/15/14 0001    Transfers   Transfers --  required hoyer lift - total dependence   Comments assist x 2 for positioning pt correctly, and operating all DME (w/c and hoyer lift)   Neurological Re-education Exercises   Trunk Exercises Core Activation   Trunk Core Activation Pt working anterior pelvic tilt with trunk extension initially with 2 arm support on mat, then disengaging UE's alternating one at a time, and then simultaneously (required min assist for trunk control when disengaging BUE's together). Pt also performing contralateral and ipsilateral reaching bilaterally with one hand support. Pt required 1 rest break  OT Short Term Goals - 06/15/14 1432    OT SHORT TERM GOAL #1   Title Independent w/ HEP for UE strengthening   Baseline dependent   Period --  assess after 3-6 weeks    Status Achieved   OT SHORT TERM GOAL #2   Title Pt to be Modified independence w/ UE dressing in wheelchair   Baseline min assist   Status On-going   OT SHORT TERM GOAL #3   Title Pt to perform transfer using sliding board w/ max assist x2 in prep for using BSC   Baseline DEPENDENT - using hoyer lift   Status On-going   OT SHORT TERM GOAL #4   Title Pt to perform long sitiing in bed w/ min assist in prep for LE ADLS   Status On-going           OT Long Term Goals - 04/18/14 1251    OT LONG TERM GOAL #1   Title Pt to  perform static sitting EOB independently in prep for potential UB self care needs   Baseline requires assist due to trunk control   Time --  assess 6-12 weeks   Status New   OT LONG TERM GOAL #2   Title Pt to perform functional transfer to [facility] using sliding board w/ max assist x 1   Baseline dependent - using hoyer lift   Status New   OT LONG TERM GOAL #3   Title Pt to perform UB bathing in w/c with set up only   Baseline requires min assist in bed   Status New   OT LONG TERM GOAL #4   Title Perform LB bathing and dressing in bed w/ mod assist long sitting   Baseline dependent   Status New               Plan - 06/15/14 1433    Clinical Impression Statement Pt demo some trunk control and activation while sitting edge of mat with one hand support.    Plan continue trunk control in prep for ADLS   Consulted and Agree with Plan of Care Patient        Problem List Patient Active Problem List   Diagnosis Date Noted  . Hypertension 05/01/2014  . Ingrown right big toenail 05/01/2014  . Hypokalemia 05/01/2014  . GERD (gastroesophageal reflux disease) 02/14/2014  . Dysphagia, pharyngoesophageal phase 02/06/2014  . Type 2 diabetes mellitus without complication 03/50/0938  . Constipation   . Neurogenic bowel   . 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 Metastatic cancer to spine 01/19/2014  . Rotator cuff tendonitis   . Acute postoperative respiratory failure 01/09/2014  . 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 Spine metastasis 12/15/2013  . Leg weakness, bilateral 12/13/2013  . Intractable low back pain 12/10/2013  . Back pain 12/10/2013    Carey Bullocks, OTR/L 06/15/2014, 2:35 PM  [facility] [address] [place], [place], [address] Phone: [phone]   Fax:  [phone]

## 2014-06-18 ENCOUNTER — Telehealth: Payer: Self-pay | Admitting: Internal Medicine

## 2014-06-18 NOTE — Telephone Encounter (Signed)
Pt called on 06/16/14 - she was out of Xarelto. I called in 20 mg Xarelto #30 and 0 refills. F/u w/pt's PCP.

## 2014-06-19 NOTE — Telephone Encounter (Signed)
Hey can you ladies help me out with this? I put the order in for the commode -- in the comments I asked for the "large" but there was no other option listed. If there is a different order to put in just tell me and I'll do it, but this is taking way too much time.  Thank you.

## 2014-06-20 ENCOUNTER — Encounter: Payer: Self-pay | Admitting: Family Medicine

## 2014-06-20 ENCOUNTER — Ambulatory Visit (INDEPENDENT_AMBULATORY_CARE_PROVIDER_SITE_OTHER): Payer: Medicaid Other | Admitting: Family Medicine

## 2014-06-20 VITALS — BP 134/84 | HR 92 | Temp 98.0°F | Ht 71.0 in

## 2014-06-20 DIAGNOSIS — L600 Ingrowing nail: Secondary | ICD-10-CM

## 2014-06-20 DIAGNOSIS — G8220 Paraplegia, unspecified: Secondary | ICD-10-CM

## 2014-06-20 DIAGNOSIS — G839 Paralytic syndrome, unspecified: Secondary | ICD-10-CM

## 2014-06-20 MED ORDER — POTASSIUM CHLORIDE CRYS ER 10 MEQ PO TBCR: 10.0000 meq | EXTENDED_RELEASE_TABLET | Freq: Every day | ORAL | Status: DC

## 2014-06-20 NOTE — Assessment & Plan Note (Signed)
Patient has been progressing through physical therapy very well. She states that they're beginning to work on her core strength and that she is making steady improvements. Patient informed me that there was a significant change in her overall status of late. It appears as though patient's mobility has begun to resurface. Patient was thrilled to show me her ability to perform minimal abduction/adduction of her legs bilaterally, as well as some minimal movement of her toes bilaterally, and minimal plantar/dorsiflexion of her feet bilaterally.  - Incredible news - I informed patient to continue attending physical therapy. Continue her positive attitude. And to anticipate a long, and hopefully fulfilling, journey towards recovery. Patient and family seemed aware that recent developments were not a definite indication of total recovery. Patient family seemed rejuvenated with hope that patient may one day consider herself no longer wheelchair bound. - Reordered physical therapy and occupational therapy.

## 2014-06-20 NOTE — Assessment & Plan Note (Addendum)
Significantly improved. No erythema or drainage. No current evidence of cellulitis. 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 Pulses present bilaterally. No signs of tissue breakdown from frictional forces with concern for pressure ulcer. - Patient followed instructions with warm soaks and keeping skin dry when not soaking.  - Had been treated w/ levaquin for UTI last month; this likely helped  - Instructed patient to continue to watch this toe and to contact me if any changes occur, or infected site begins to drain/weep.

## 2014-06-20 NOTE — Patient Instructions (Signed)
It was a pleasure seeing you today in our clinic. Today we discussed your toe, and your physical therapy. Here is the treatment plan we have discussed and agreed upon together:   - Keep up the great work! - keep me posted on all your progress. I have reordered your PT/OT therapy today as well as the potassium supplements. Doristine Devoid seeing you!

## 2014-06-20 NOTE — Progress Notes (Signed)
HPI  Patient presents today for follow-up on ingrown toenail Ingrown toenail: Patient has been following my instructions to soak her right foot daily in warm water. She states that since our last meeting her ingrown toenail has gradually improved. [name] paraplegic below the waist so pain has not been a factor. Patient reports no issues with skin breakdown and she and her family have been diligent about avoiding this while soaking her foot. Denies fevers chills headaches body aches diaphoresis nausea vomiting diarrhea.  Paraplegia: Patient has been attending her neuro rehabilitation regularly. She no longer requires chronic Foley catheterization. Patient states that her rehabilitation specialists have been focusing significant amount of their time on her core exercises. She does report some vague abdominal pain which is unassociated with any other activities like eating or drinking. ([name] currently on a PPI). Patient has been progressing well throughout her neuro rehabilitation and states that she will likely need another referral to continue this treatment.  Smoking status noted: former ROS: Per HPI  Objective: BP 134/84 mmHg  Pulse 92  Temp(Src) 98 F (36.7 C) (Oral)  Ht 5\' 11"  (1.803 m) Gen: NAD, alert, cooperative with exam HEENT: NCAT, EOMI, PERRL, surgical scar across entire anterior neck CV: RRR, good S1/S2, no murmur Resp: CTABL, no wheezes, non-labored Abd: Soft, mild epigastric tenderness, ND, BS present, no guarding or organomegaly Ext: No edema, warm, pulses present equally bilaterally Neuro: Alert and oriented, 1/5 strength bilateral lower extremities with hip abduction/adduction, 1/5 strength bilateral plantar/dorsiflexion. 1/5 strength extensor hallucis longus bilateral. Reflexes not assessed  Assessment and plan:  Ingrown right big toenail Significantly improved. No erythema or drainage. No current evidence of cellulitis. Onychomycoses present in multiple  digits. Pulses present bilaterally. No signs of tissue breakdown from frictional forces with concern for pressure ulcer. - Patient followed instructions with warm soaks and keeping skin dry when not soaking.  - Had been treated w/ levaquin for UTI last month; this likely helped  - Instructed patient to continue to watch this toe and to contact me if any changes occur, or infected site begins to drain/weep.   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 Patient has been progressing through physical therapy very well. She states that they're beginning to work on her core strength and that she is making steady improvements. Patient informed me that there was a significant change in her overall status of late. It appears as though patient's mobility has begun to resurface. Patient was thrilled to show me her ability to perform minimal abduction/adduction of her legs bilaterally, as well as some minimal movement of her toes bilaterally, and minimal plantar/dorsiflexion of her feet bilaterally.  - Incredible news - I informed patient to continue attending physical therapy. Continue her positive attitude. And to anticipate a long, and hopefully fulfilling, journey towards recovery. Patient and family seemed aware that recent developments were not a definite indication of total recovery. Patient family seemed rejuvenated with hope that patient may one day consider herself no longer wheelchair bound. - Reordered physical therapy and occupational therapy.   NOTE: If abdominal pain persists I will reevaluate/reassess.   Orders Placed This Encounter  Procedures  . Ambulatory referral to Physical Therapy    Referral Priority:  Routine    Referral Type:  Physical Medicine    Referral Reason:  Specialty Services Required    Requested Specialty:  Physical Therapy    Number of Visits Requested:  1  . Ambulatory referral to Occupational Therapy    Referral Priority:  Routine    Referral Type:  Occupational Therapy    Referral  Reason:  Specialty Services Required    Requested Specialty:  Occupational Therapy    Number of Visits Requested:  1    Meds ordered this encounter  Medications  . potassium chloride (KLOR-CON M10) 10 MEQ tablet    Sig: Take 1 tablet (10 mEq total) by mouth daily.    Dispense:  30 tablet    Refill:  3     Sarah Leatherwood, MD,MS,  PGY1 06/20/2014 6:21 PM

## 2014-06-21 ENCOUNTER — Ambulatory Visit: Payer: Medicaid Other | Admitting: Physical Therapy

## 2014-06-21 ENCOUNTER — Encounter: Payer: Self-pay | Admitting: Physical Therapy

## 2014-06-21 DIAGNOSIS — R29818 Other symptoms and signs involving the nervous system: Secondary | ICD-10-CM

## 2014-06-21 DIAGNOSIS — G839 Paralytic syndrome, unspecified: Secondary | ICD-10-CM | POA: Diagnosis not present

## 2014-06-21 DIAGNOSIS — R2991 Unspecified symptoms and signs involving the musculoskeletal system: Secondary | ICD-10-CM

## 2014-06-21 NOTE — Therapy (Signed)
[facility] [address] [place], [place], [address] Phone: [phone]   Fax:  [phone]  Physical Therapy Treatment  Patient Details  Name: Sarah [name] MRN: [id number] Date of Birth: 10/03/1965 Referring Provider:  Elberta Leatherwood, MD  Encounter Date: 06/21/2014      PT End of Session - 06/21/14 1322    Visit Number 4   Number of Visits 6   Date for PT Re-Evaluation 06/17/14   Authorization Type Medicaid   Authorization Time Period 1-29  - 07-13-14   Authorization - Visit Number 3   Authorization - Number of Visits 6   PT Start Time 1324   PT Stop Time 1148   PT Time Calculation (min) 45 min   Equipment Utilized During Treatment Other (comment)  hoyer lift   Activity Tolerance Patient tolerated treatment well   Behavior During Therapy [facility] for tasks assessed/performed      Past Medical History  Diagnosis Date  . Hypertension   . DDD (degenerative disc disease), cervical   . DJD (degenerative joint disease)   . Obesity   . Chronic back pain   . 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 History of rectal fissure   . Rotator cuff tendonitis right    Past Surgical History  Procedure Laterality Date  . Tonsillectomy    . Laminectomy N/A 12/14/2013    Procedure: THORACIC LAMINECTOMY FOR TUMOR THORACIC THREE;  Surgeon: Ashok Pall, MD;  Location: [facility];  Service: Neurosurgery;  Laterality: N/A;  THORACIC LAMINECTOMY FOR TUMOR THORACIC THREE  . Posterior lumbar fusion 4 level N/A 12/30/2013    Procedure: Thoracic one-Thoracic five posterior thoracic fusion with pedicle screws;  Surgeon: Ashok Pall, MD;  Location: [facility];  Service: Neurosurgery;  Laterality: N/A;  Thoracic one-Thoracic five posterior thoracic fusion with pedicle screws  . Thyroidectomy N/A 01/09/2014    Procedure: TOTAL THYROIDECTOMY;  Surgeon: Izora Gala, MD;  Location: [facility];  Service: ENT;   Laterality: N/A;    There were no vitals filed for this visit.  Visit Diagnosis:  Poor trunk control      Subjective Assessment - 06/21/14 1320    Symptoms Denies changes since last visit.   Patient Stated Goals Improve mobility   Currently in Pain? Yes   Pain Score 4    Pain Location Back   Pain Descriptors / Indicators Constant;Tightness   Pain Type Chronic pain   Pain Onset More than a month ago      Pt. Was transferred from wheelchair to mat with hoyer lift; worked on unsupported sitting balance with 1 or 2 UE support Prn; some balance performed without UE support; trunk flexion and extension with blue wedge and 2 pillows behind x 7 reps each with mod Assist for flexion; pt. Performed sitting to 1/2 sitting on forearm x 5 reps to each side with return to upright with min assist.           PT Short Term Goals - 04/18/14 1244    PT SHORT TERM GOAL #1   Title Pt. will transfer with sliding board wheelchair to mat with +1 max assist.   Baseline Dependent - pt. is using a hoyer lift for transfers - unable to attempt sliding board at this time at home   Time 4  05-22-14   Period Weeks   Status New   PT SHORT TERM GOAL #2   Title  Sit unsupported on side of mat x 5" with +2 mod assist   Baseline Dependent - pt. is unable to sit unsupported at this time due to decreased trunk musc.   Time 4  05-22-14   Period Weeks   Status New   PT SHORT TERM GOAL #3   Title Independent to direct HEP (stretches and PROM for bil. LE's)   Baseline Dependent   Time 4  05-22-14   Period Weeks   Status New           PT Long Term Goals - 04/18/14 1700    PT LONG TERM GOAL #1   Title Transfer with sliding board with +1 mod assist wheelchair to/from mat   Baseline Pt. requires hoyer lift - unable to use sliding board.     Target date 06-17-14   Time 12   Period Weeks   Status New   PT LONG TERM GOAL #2   Title Sit on side of mat for at least 10" with UE support prn with SBA to  incr. independence with dressing, grooming,etc. at home.   Baseline Per pt. report, pt is unable to sit unsupported - unable to assess due to pt. declining to transfer out of wheelchair during initial eval due to not feeling well     target date 06-17-14   Time 12   Period Weeks   Status New   PT LONG TERM GOAL #3   Title Sit unsupported on side of mat with 1 UE support and reach 4" anteriorly with other UE support with min assist to increase independence with ADL's with mod assist   Baseline Dependent   target date 06-17-14   Time 12   Period Weeks   Status New   PT LONG TERM GOAL #4   Title Independent to direct HEP for LE stretching and PROM   Baseline Dependent   target date 06-17-14   Time [place] - 06/21/14 1323    Clinical Impression Statement Pt continues with decreased pain each visit but still with tightness/stretching pain/discomfort.  Continue PT per POC.   Pt will benefit from skilled therapeutic intervention in order to improve on the following deficits Decreased mobility;Obesity;Decreased range of motion;Impaired flexibility;Increased muscle spasms;Decreased balance;Decreased strength   Rehab Potential Fair   PT Frequency 2x / week   PT Duration 12 weeks   PT Treatment/Interventions ADLs/Self Care Home Management;Therapeutic activities;Patient/family education;Therapeutic exercise;Wheelchair mobility training;Balance training;Neuromuscular re-education;Functional mobility training   PT Next Visit Plan Check STG's.   Consulted and Agree with Plan of Care Patient        Problem List Patient Active Problem List   Diagnosis Date Noted  . Hypertension 05/01/2014  . Ingrown right big toenail 05/01/2014  . Hypokalemia 05/01/2014  . GERD (gastroesophageal reflux disease) 02/14/2014  . Dysphagia, pharyngoesophageal phase 02/06/2014  . Type 2 diabetes mellitus without complication 86/57/8469  . Constipation   . Neurogenic  bowel   . 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 Metastatic cancer to spine 01/19/2014  . Rotator cuff tendonitis   . Acute postoperative respiratory failure 01/09/2014  . 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 Leg weakness, bilateral 12/13/2013    Narda Bonds 06/21/2014, 1:34 PM  [facility] [address] [place], [place], [address] Phone:  [phone]   Fax:  [facility], [place] 06/21/2014 1:35 PM Phone: [phone] Fax: [phone]

## 2014-06-22 NOTE — Telephone Encounter (Signed)
LMOVM for pt to call us back. Deseree Blount, CMA  

## 2014-06-23 ENCOUNTER — Other Ambulatory Visit: Payer: Self-pay | Admitting: Internal Medicine

## 2014-06-23 DIAGNOSIS — C73 Malignant neoplasm of thyroid gland: Secondary | ICD-10-CM

## 2014-06-26 ENCOUNTER — Ambulatory Visit: Payer: BLUE CROSS/BLUE SHIELD | Attending: Family Medicine | Admitting: Physical Therapy

## 2014-06-26 ENCOUNTER — Ambulatory Visit: Payer: BLUE CROSS/BLUE SHIELD | Admitting: Occupational Therapy

## 2014-06-26 DIAGNOSIS — G839 Paralytic syndrome, unspecified: Secondary | ICD-10-CM | POA: Diagnosis present

## 2014-06-26 DIAGNOSIS — M6281 Muscle weakness (generalized): Secondary | ICD-10-CM | POA: Insufficient documentation

## 2014-06-26 DIAGNOSIS — R29818 Other symptoms and signs involving the nervous system: Secondary | ICD-10-CM

## 2014-06-26 DIAGNOSIS — R2991 Unspecified symptoms and signs involving the musculoskeletal system: Secondary | ICD-10-CM

## 2014-06-26 DIAGNOSIS — G8220 Paraplegia, unspecified: Secondary | ICD-10-CM

## 2014-06-26 NOTE — Therapy (Signed)
[facility] [address] [place], [place], [address] Phone: [phone]   Fax:  [phone]  Occupational Therapy Treatment  Patient Details  Name: Sarah [name] MRN: [id number] Date of Birth: 09/30/1965 Referring Provider:  Elberta Leatherwood, MD  Encounter Date: 06/26/2014      OT End of Session - 06/26/14 1231    Visit Number 5   Number of Visits 13   Date for OT Re-Evaluation 07/13/14   Authorization Type medicaid    Authorization Time Period 04/21/14-4/21/6   Authorization - Visit Number 4   Authorization - Number of Visits 12   OT Start Time 1020   OT Stop Time 1100   OT Time Calculation (min) 40 min   Activity Tolerance Patient tolerated treatment well      Past Medical History  Diagnosis Date  . Hypertension   . DDD (degenerative disc disease), cervical   . DJD (degenerative joint disease)   . Obesity   . Chronic back pain   . 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 History of rectal fissure   . Rotator cuff tendonitis right    Past Surgical History  Procedure Laterality Date  . Tonsillectomy    . Laminectomy N/A 12/14/2013    Procedure: THORACIC LAMINECTOMY FOR TUMOR THORACIC THREE;  Surgeon: Ashok Pall, MD;  Location: [facility];  Service: Neurosurgery;  Laterality: N/A;  THORACIC LAMINECTOMY FOR TUMOR THORACIC THREE  . Posterior lumbar fusion 4 level N/A 12/30/2013    Procedure: Thoracic one-Thoracic five posterior thoracic fusion with pedicle screws;  Surgeon: Ashok Pall, MD;  Location: [facility];  Service: Neurosurgery;  Laterality: N/A;  Thoracic one-Thoracic five posterior thoracic fusion with pedicle screws  . Thyroidectomy N/A 01/09/2014    Procedure: TOTAL THYROIDECTOMY;  Surgeon: Izora Gala, MD;  Location: [facility];  Service: ENT;  Laterality: N/A;    There were no vitals filed for this visit.  Visit Diagnosis:  Poor trunk control  Muscle weakness  (generalized)      Subjective Assessment - 06/26/14 1026    Pertinent History (p) s/p laminectomy 12/14/13, s/p lumbar fusion 12/30/13 by Dr. Cyndy Freeze (secondary to tumor)   Patient Stated Goals (p) be more independent and walk again   Currently in Pain? (p) Yes   Pain Score (p) 3    Pain Location (p) Back  O.T. not addressing secondary to outside scope of practice                    OT Treatments/Exercises (OP) - 06/26/14 1226    Shoulder Exercises: Seated   Other Seated Exercises Performed shoulder rows, shoulder extension, and horizontal abd. each bilaterally with red theraband x 15 reps each.    Neurological Re-education Exercises   Other Exercises 1 In wheelchair: from reclined position to upright position x 10 reps, 2 sets for core strengthening followed by maintaing upright position off back of w/c while performing UE shoulder flexion alternating, then bilaterally for increased trunk control.    Other Exercises 2 Leaning to side wt. bearing on elbow and pushing back to center bilaterally with occasional min assist to maintain hip flexion.                   OT Short Term Goals - 06/26/14 1240    OT SHORT TERM GOAL #1   Title Independent w/ HEP for UE strengthening   Baseline dependent  Period --  assess after 3-6 weeks    Status Achieved   OT SHORT TERM GOAL #2   Title Pt to be Modified independence w/ UE dressing in wheelchair   Baseline min assist   Status On-going   OT SHORT TERM GOAL #3   Title Pt to perform transfer using sliding board w/ max assist x2 in prep for using BSC   Baseline DEPENDENT - using hoyer lift   Status Not Met  still requires hoyer lift   OT SHORT TERM GOAL #4   Title Pt to perform long sitiing in bed w/ min assist in prep for LE ADLS   Status Not Met  lacks adequate trunk control            OT Long Term Goals - 04/18/14 1251    OT LONG TERM GOAL #1   Title Pt to perform static sitting EOB independently in prep for  potential UB self care needs   Baseline requires assist due to trunk control   Time --  assess 6-12 weeks   Status New   OT LONG TERM GOAL #2   Title Pt to perform functional transfer to [facility] using sliding board w/ max assist x 1   Baseline dependent - using hoyer lift   Status New   OT LONG TERM GOAL #3   Title Pt to perform UB bathing in w/c with set up only   Baseline requires min assist in bed   Status New   OT LONG TERM GOAL #4   Title Perform LB bathing and dressing in bed w/ mod assist long sitting   Baseline dependent   Status New               Plan - 06/26/14 1241    Clinical Impression Statement Pt with increased trunk control sitting off of wheelchair however fatigues quickly and requires multiple rest breaks.    Plan assess UE dressing again for STG #2   Consulted and Agree with Plan of Care Patient        Problem List Patient Active Problem List   Diagnosis Date Noted  . Hypertension 05/01/2014  . Ingrown right big toenail 05/01/2014  . Hypokalemia 05/01/2014  . GERD (gastroesophageal reflux disease) 02/14/2014  . Dysphagia, pharyngoesophageal phase 02/06/2014  . Type 2 diabetes mellitus without complication 34/94/9447  . Constipation   . Neurogenic bowel   . 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 Metastatic cancer to spine 01/19/2014  . Rotator cuff tendonitis   . Acute postoperative respiratory failure 01/09/2014  . 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 Leg weakness, bilateral 12/13/2013    Carey Bullocks, OTR/L 06/26/2014, 12:43 PM  [facility] [address] [place], [place], [address] Phone: [phone]   Fax:  [phone]

## 2014-06-27 ENCOUNTER — Encounter: Payer: Self-pay | Admitting: Physical Therapy

## 2014-06-27 NOTE — Therapy (Signed)
[facility] [address] [place], [place], [address] Phone: [phone]   Fax:  [phone]  Physical Therapy Treatment  Patient Details  Name: Sarah [name] MRN: [id number] Date of Birth: 04-14-1965 Referring Provider:  Elberta Leatherwood, MD  Encounter Date: 06/26/2014      PT End of Session - 06/27/14 1725    Visit Number 5   Number of Visits 6   Date for PT Re-Evaluation 06/30/14   Authorization Type Medicaid   Authorization Time Period 1-29  - 07-13-14   Authorization - Visit Number 5   Authorization - Number of Visits 6   PT Start Time 6962   PT Stop Time 1146   PT Time Calculation (min) 44 min      Past Medical History  Diagnosis Date  . Hypertension   . DDD (degenerative disc disease), cervical   . DJD (degenerative joint disease)   . Obesity   . Chronic back pain   . 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 History of rectal fissure   . Rotator cuff tendonitis right    Past Surgical History  Procedure Laterality Date  . Tonsillectomy    . Laminectomy N/A 12/14/2013    Procedure: THORACIC LAMINECTOMY FOR TUMOR THORACIC THREE;  Surgeon: Ashok Pall, MD;  Location: [facility];  Service: Neurosurgery;  Laterality: N/A;  THORACIC LAMINECTOMY FOR TUMOR THORACIC THREE  . Posterior lumbar fusion 4 level N/A 12/30/2013    Procedure: Thoracic one-Thoracic five posterior thoracic fusion with pedicle screws;  Surgeon: Ashok Pall, MD;  Location: [facility];  Service: Neurosurgery;  Laterality: N/A;  Thoracic one-Thoracic five posterior thoracic fusion with pedicle screws  . Thyroidectomy N/A 01/09/2014    Procedure: TOTAL THYROIDECTOMY;  Surgeon: Izora Gala, MD;  Location: [facility];  Service: ENT;  Laterality: N/A;    There were no vitals filed for this visit.  Visit Diagnosis:  Muscle weakness (generalized)  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      Subjective Assessment - 06/27/14  1723    Subjective Pt. reports she continues to gain some movement in her legs - mostly adduction in seated position   Patient Stated Goals Improve mobility   Currently in Pain? Yes   Pain Score 4    Pain Location Back   Pain Orientation Upper   Pain Descriptors / Indicators Aching;Tightness   Pain Type Chronic pain   Pain Onset More than a month ago   Pain Frequency Constant      TherEx: PROM bil. LE's with pt. Seated in wheelchair - hip and knee flexion and extension x 15 reps; bil. Hamstring stretching X 30 sec hold x 3 reps; bil. Heel cord stretching x 30 sec hold  Pt. Performed scifit level 1.3 with UE's and LE's x 10"   Did not transfer out of wheelchair with hoyer lift due to lack of available staff to assist with transfer - safety concerns with only 1 person Performing the transfer                        PT Short Term Goals - 04/18/14 1244    PT SHORT TERM GOAL #1   Title Pt. will transfer with sliding board wheelchair to mat with +1 max assist.   Baseline Dependent - pt. is using a hoyer lift for transfers - unable to attempt sliding board at this time at home  Time 4  05-22-14   Period Weeks   Status New   PT SHORT TERM GOAL #2   Title Sit unsupported on side of mat x 5" with +2 mod assist   Baseline Dependent - pt. is unable to sit unsupported at this time due to decreased trunk musc.   Time 4  05-22-14   Period Weeks   Status New   PT SHORT TERM GOAL #3   Title Independent to direct HEP (stretches and PROM for bil. LE's)   Baseline Dependent   Time 4  05-22-14   Period Weeks   Status New           PT Long Term Goals - 04/18/14 1700    PT LONG TERM GOAL #1   Title Transfer with sliding board with +1 mod assist wheelchair to/from mat   Baseline Pt. requires hoyer lift - unable to use sliding board.     Target date 06-17-14   Time 12   Period Weeks   Status New   PT LONG TERM GOAL #2   Title Sit on side of mat for at least 10" with  UE support prn with SBA to incr. independence with dressing, grooming,etc. at home.   Baseline Per pt. report, pt is unable to sit unsupported - unable to assess due to pt. declining to transfer out of wheelchair during initial eval due to not feeling well     target date 06-17-14   Time 12   Period Weeks   Status New   PT LONG TERM GOAL #3   Title Sit unsupported on side of mat with 1 UE support and reach 4" anteriorly with other UE support with min assist to increase independence with ADL's with mod assist   Baseline Dependent   target date 06-17-14   Time 12   Period Weeks   Status New   PT LONG TERM GOAL #4   Title Independent to direct HEP for LE stretching and PROM   Baseline Dependent   target date 06-17-14   Time 12   Period Weeks   Status New               Plan - 06/27/14 1727    Clinical Impression Statement Pt. reported legs felt better after stretching; did not attempt to transfer pt. out of wheelchair with hoyer lift today due to no one available to assist with transfer   Pt will benefit from skilled therapeutic intervention in order to improve on the following deficits Decreased mobility;Obesity;Decreased range of motion;Impaired flexibility;Increased muscle spasms;Decreased balance;Decreased strength   Rehab Potential Fair   PT Frequency 2x / week   PT Duration 12 weeks   PT Treatment/Interventions ADLs/Self Care Home Management;Therapeutic activities;Patient/family education;Therapeutic exercise;Wheelchair mobility training;Balance training;Neuromuscular re-education;Functional mobility training   PT Next Visit Plan check STG's   Consulted and Agree with Plan of Care Patient        Problem List Patient Active Problem List   Diagnosis Date Noted  . Hypertension 05/01/2014  . Ingrown right big toenail 05/01/2014  . Hypokalemia 05/01/2014  . GERD (gastroesophageal reflux disease) 02/14/2014  . Dysphagia, pharyngoesophageal phase 02/06/2014  . Type 2 diabetes  mellitus without complication 09/17/9483  . Constipation   . Neurogenic bowel   . 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 Metastatic cancer to spine 01/19/2014  . Rotator cuff tendonitis   . Acute postoperative respiratory failure 01/09/2014  . 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 Leg weakness, bilateral 12/13/2013    IVHSJW, TGRMB OBOFPUL, PT 06/27/2014, 5:30 PM  [facility] [address] [place], [place], [address] Phone: [phone]   Fax:  [phone]

## 2014-06-29 ENCOUNTER — Ambulatory Visit: Payer: BLUE CROSS/BLUE SHIELD | Admitting: Occupational Therapy

## 2014-06-29 ENCOUNTER — Ambulatory Visit: Payer: BLUE CROSS/BLUE SHIELD | Admitting: Physical Therapy

## 2014-07-03 ENCOUNTER — Ambulatory Visit: Payer: Medicaid Other

## 2014-07-03 ENCOUNTER — Other Ambulatory Visit: Payer: Self-pay | Admitting: Neurosurgery

## 2014-07-03 ENCOUNTER — Ambulatory Visit
Admission: RE | Admit: 2014-07-03 | Discharge: 2014-07-03 | Disposition: A | Discharge status: Home / Self Care | Payer: BLUE CROSS/BLUE SHIELD | Source: Ambulatory Visit | Attending: Neurosurgery | Admitting: Neurosurgery

## 2014-07-03 DIAGNOSIS — M5489 Other dorsalgia: Secondary | ICD-10-CM

## 2014-07-06 ENCOUNTER — Encounter: Payer: BLUE CROSS/BLUE SHIELD | Admitting: Occupational Therapy

## 2014-07-06 ENCOUNTER — Ambulatory Visit: Payer: BLUE CROSS/BLUE SHIELD | Admitting: Physical Therapy

## 2014-07-06 ENCOUNTER — Telehealth: Payer: Self-pay | Admitting: *Deleted

## 2014-07-06 DIAGNOSIS — M6281 Muscle weakness (generalized): Secondary | ICD-10-CM

## 2014-07-06 DIAGNOSIS — G8220 Paraplegia, unspecified: Secondary | ICD-10-CM

## 2014-07-06 NOTE — Telephone Encounter (Signed)
[name] has called for [facility] saying that she needs additional orders for PT/OT.  They are needing an order for a "big" bedside commode, "big" shower chair, and "big" transfer board.

## 2014-07-07 NOTE — Telephone Encounter (Signed)
Ordered and left VM telling them to let us know if they want to pick up rx for supplies or have them mailed.

## 2014-07-07 NOTE — Telephone Encounter (Signed)
She may have all three. thanks

## 2014-07-10 NOTE — Telephone Encounter (Signed)
No call back so I am mailing Rx for her supplies

## 2014-07-11 ENCOUNTER — Telehealth: Payer: Self-pay | Admitting: *Deleted

## 2014-07-11 ENCOUNTER — Encounter: Payer: Self-pay | Admitting: Occupational Therapy

## 2014-07-11 ENCOUNTER — Ambulatory Visit: Payer: BLUE CROSS/BLUE SHIELD | Admitting: Occupational Therapy

## 2014-07-11 DIAGNOSIS — R2991 Unspecified symptoms and signs involving the musculoskeletal system: Secondary | ICD-10-CM

## 2014-07-11 DIAGNOSIS — Z7409 Other reduced mobility: Secondary | ICD-10-CM

## 2014-07-11 DIAGNOSIS — R29818 Other symptoms and signs involving the nervous system: Secondary | ICD-10-CM

## 2014-07-11 DIAGNOSIS — M6281 Muscle weakness (generalized): Secondary | ICD-10-CM

## 2014-07-11 DIAGNOSIS — G839 Paralytic syndrome, unspecified: Secondary | ICD-10-CM | POA: Diagnosis not present

## 2014-07-11 NOTE — Telephone Encounter (Signed)
Recd call from patient asking [place] to order a bedside commode.

## 2014-07-11 NOTE — Therapy (Signed)
[facility] [address] [place], [place], [address] Phone: [phone]   Fax:  [phone]  Occupational Therapy Treatment  Patient Details  Name: Sarah [name] MRN: [id number] Date of Birth: 05-11-1965 Referring Provider:  Elberta Leatherwood, MD  Encounter Date: 07/11/2014      OT End of Session - 07/11/14 1203    Visit Number 6   Number of Visits 19   Date for OT Re-Evaluation 08/25/14   Authorization Type medicaid, however, as of 07/11/14 pt reports now has BC/BS   Authorization Time Period 04/21/14-4/21/6 with MCD, (week 6/12)   Authorization - Visit Number 6   Authorization - Number of Visits 12   OT Start Time 1110   OT Stop Time 1148   OT Time Calculation (min) 38 min   Activity Tolerance Patient tolerated treatment well      Past Medical History  Diagnosis Date  . Hypertension   . DDD (degenerative disc disease), cervical   . DJD (degenerative joint disease)   . Obesity   . Chronic back pain   . 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 History of rectal fissure   . Rotator cuff tendonitis right    Past Surgical History  Procedure Laterality Date  . Tonsillectomy    . Laminectomy N/A 12/14/2013    Procedure: THORACIC LAMINECTOMY FOR TUMOR THORACIC THREE;  Surgeon: Ashok Pall, MD;  Location: [facility];  Service: Neurosurgery;  Laterality: N/A;  THORACIC LAMINECTOMY FOR TUMOR THORACIC THREE  . Posterior lumbar fusion 4 level N/A 12/30/2013    Procedure: Thoracic one-Thoracic five posterior thoracic fusion with pedicle screws;  Surgeon: Ashok Pall, MD;  Location: [facility];  Service: Neurosurgery;  Laterality: N/A;  Thoracic one-Thoracic five posterior thoracic fusion with pedicle screws  . Thyroidectomy N/A 01/09/2014    Procedure: TOTAL THYROIDECTOMY;  Surgeon: Izora Gala, MD;  Location: [facility];  Service: ENT;  Laterality: N/A;    There were no vitals filed for  this visit.  Visit Diagnosis:  Poor trunk control - Plan: Ot plan of care cert/re-cert  Muscle weakness (generalized) - Plan: Ot plan of care cert/re-cert  Decreased functional mobility and endurance - Plan: Ot plan of care cert/re-cert      Subjective Assessment - 07/11/14 1120    Pertinent History s/p laminectomy 12/14/13, s/p lumbar fusion 12/30/13 by Dr. Cyndy Freeze (secondary to tumor)   Patient Stated Goals be more independent and walk again   Currently in Pain? Yes  in buttocks and LE's - O.T. not addressing secondary to outside scope of practice                      OT Treatments/Exercises (OP) - 07/11/14 1158    ADLs   UB Dressing Pt donned and doffed short sleeve shirt independently in wheelchair   Neurological Re-education Exercises   Other Exercises 1 A/P wt shifts from slightly reclined position in wheelchair for trunk/core activation encouraging trunk extension as pt comes forward off back of w/c. Then progressed to sitting up off back of w/c disengaging UE's alternating, then progressing to simultaneously. Pt fatigued quickly and required several short rest breaks.                   OT Short Term Goals - 07/11/14 1204    OT SHORT TERM GOAL #1   Title Independent w/ HEP for UE strengthening  Baseline dependent   Period --  assess after 3-6 weeks    Status Achieved   OT SHORT TERM GOAL #2   Title Pt to be Modified independence w/ UE dressing in wheelchair   Baseline min assist   Status Achieved  as of 07/11/14 with short sleeve shirt only in w/c   OT SHORT TERM GOAL #3   Title Pt to perform transfer using sliding board w/ max assist x2 in prep for using BSC   Baseline DEPENDENT - using hoyer lift   Status Not Met  still requires hoyer lift   OT SHORT TERM GOAL #4   Title Pt to perform long sitiing in bed w/ min assist in prep for LE ADLS   Status Not Met  lacks adequate trunk control            OT Long Term Goals - 07/11/14 1222     OT LONG TERM GOAL #1   Title Pt to perform static sitting EOB independently in prep for potential UB self care needs   Baseline requires assist due to trunk control   Time --  assess 6-12 weeks   Status On-going   OT LONG TERM GOAL #2   Title Pt to perform functional transfer to BSC using sliding board w/ max assist x 1   Baseline dependent - using hoyer lift   Status On-going   OT LONG TERM GOAL #3   Title Pt to perform UB bathing in w/c with set up only   Baseline requires min assist in bed   Status On-going   OT LONG TERM GOAL #4   Title Perform LB bathing and dressing in bed w/ mod assist long sitting   Baseline dependent   Status On-going               Plan - 07/11/14 1204    Clinical Impression Statement Pt met STG #2 today in wheelchair. Pt with limited progress secondary to inconsistent attendance and decreased frequency. Recommending increased frequency per week and consistency in attendance to make progress   Pt will benefit from skilled therapeutic intervention in order to improve on the following deficits (Retired) Decreased knowledge of use of DME;Impaired flexibility;Decreased coordination;Decreased mobility;Improper body mechanics;Decreased endurance;Decreased activity tolerance;Decreased range of motion;Decreased strength;Other (comment)  decreased ability to perform functional transfers   Rehab Potential Fair   Clinical Impairments Affecting Rehab Potential severity of deficits   OT Frequency 2x / week   OT Duration 6 weeks   OT Treatment/Interventions Self-care/ADL training;Neuromuscular education;Therapeutic exercise;Patient/family education;DME and/or AE instruction;Passive range of motion;Manual Therapy;Therapeutic activities  trunk control, transfers   Plan Increase frequency to 2x/wk for 6 additional weeks to continue to address unmet STG's and LTG's.         Problem List Patient Active Problem List   Diagnosis Date Noted  . Hypertension  05/01/2014  . Ingrown right big toenail 05/01/2014  . Hypokalemia 05/01/2014  . GERD (gastroesophageal reflux disease) 02/14/2014  . Dysphagia, pharyngoesophageal phase 02/06/2014  . Type 2 diabetes mellitus without complication 01/23/2014  . Constipation   . Neurogenic bowel   . 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 Metastatic cancer to spine 01/19/2014  . Rotator cuff tendonitis   . Acute postoperative respiratory failure 01/09/2014  . 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 Leg weakness, bilateral 12/13/2013    Ballie, Kelly Johnson, OTR/L 07/11/2014, 12:22 PM  [facility] [address] [place], [place], [address] Phone: [phone]   Fax:  [phone]    

## 2014-07-12 ENCOUNTER — Encounter (HOSPITAL_COMMUNITY)
Admission: RE | Admit: 2014-07-12 | Discharge: 2014-07-12 | Disposition: A | Discharge status: Home / Self Care | Payer: BLUE CROSS/BLUE SHIELD | Source: Ambulatory Visit | Attending: Internal Medicine | Admitting: Internal Medicine

## 2014-07-12 DIAGNOSIS — C73 Malignant neoplasm of thyroid gland: Secondary | ICD-10-CM | POA: Diagnosis present

## 2014-07-12 MED ORDER — THYROTROPIN ALFA 1.1 MG IM SOLR
0.9000 mg | INTRAMUSCULAR | Status: AC
  Administered 2014-07-12: 0.9 mg via INTRAMUSCULAR

## 2014-07-13 ENCOUNTER — Encounter (HOSPITAL_COMMUNITY)
Admission: RE | Admit: 2014-07-13 | Discharge: 2014-07-13 | Disposition: A | Discharge status: Home / Self Care | Payer: BLUE CROSS/BLUE SHIELD | Source: Ambulatory Visit | Attending: Internal Medicine | Admitting: Internal Medicine

## 2014-07-13 DIAGNOSIS — C73 Malignant neoplasm of thyroid gland: Secondary | ICD-10-CM | POA: Diagnosis not present

## 2014-07-13 MED ORDER — THYROTROPIN ALFA 1.1 MG IM SOLR
0.9000 mg | INTRAMUSCULAR | Status: AC
  Administered 2014-07-13: 0.9 mg via INTRAMUSCULAR

## 2014-07-14 ENCOUNTER — Encounter (HOSPITAL_COMMUNITY)
Admission: RE | Admit: 2014-07-14 | Discharge: 2014-07-14 | Disposition: A | Discharge status: Home / Self Care | Payer: BLUE CROSS/BLUE SHIELD | Source: Ambulatory Visit | Attending: Internal Medicine | Admitting: Internal Medicine

## 2014-07-14 DIAGNOSIS — C73 Malignant neoplasm of thyroid gland: Secondary | ICD-10-CM | POA: Diagnosis not present

## 2014-07-14 LAB — HCG, SERUM, QUALITATIVE: PREG SERUM: NEGATIVE

## 2014-07-14 MED ORDER — SODIUM IODIDE I 131 CAPSULE
137.6000 | Freq: Once | INTRAVENOUS | Status: AC | PRN
  Administered 2014-07-14: 137.6 via ORAL

## 2014-07-18 ENCOUNTER — Ambulatory Visit: Payer: BLUE CROSS/BLUE SHIELD | Admitting: Physical Therapy

## 2014-07-20 ENCOUNTER — Telehealth: Payer: Self-pay | Admitting: Family Medicine

## 2014-07-20 ENCOUNTER — Encounter: Payer: Self-pay | Admitting: Occupational Therapy

## 2014-07-20 ENCOUNTER — Ambulatory Visit: Payer: BLUE CROSS/BLUE SHIELD | Admitting: Occupational Therapy

## 2014-07-20 ENCOUNTER — Telehealth: Payer: Self-pay | Admitting: Clinical

## 2014-07-20 DIAGNOSIS — R2991 Unspecified symptoms and signs involving the musculoskeletal system: Secondary | ICD-10-CM

## 2014-07-20 DIAGNOSIS — R29818 Other symptoms and signs involving the nervous system: Secondary | ICD-10-CM

## 2014-07-20 DIAGNOSIS — G839 Paralytic syndrome, unspecified: Secondary | ICD-10-CM | POA: Diagnosis not present

## 2014-07-20 NOTE — Telephone Encounter (Signed)
Discussed this w/ CSW. CSW was able to tease out some of the details of this process. CSW was able to get this questions answered and communicated w/ patient.  Greatly appreciate CSW doing this.

## 2014-07-20 NOTE — Telephone Encounter (Signed)
CSW called pt regarding her request for a "[place] sitter." Pt stated she was referring to an aide, someone who could help her with custodial needs. CSW informed her that this would only be covered through [facility] which pt has. Pt confirmed that she will be losing her Medicaid in 30 days in which she will be losing her aide as well. CSW informed pt that an aide would be private pay as BCBS/HH would not cover an aide. CSW encouraged pt to contact her insurance again and specify an aide and not home health as her insurance may have given her information for home health.  CSW sent a message to [facility] to confirm the above.  Hunt Oris, MSW, [place]

## 2014-07-20 NOTE — Telephone Encounter (Signed)
Sister called and is requesting home Water quality scientist for Sarah [name]. Can we put the orders in for this. jw

## 2014-07-20 NOTE — Telephone Encounter (Signed)
Needs to be with [place] [facility]

## 2014-07-20 NOTE — Therapy (Signed)
[facility] [address] [place], [place], [address] Phone: [phone]   Fax:  [phone]  Occupational Therapy Treatment  Patient Details  Name: Sarah [name] MRN: [id number] Date of Birth: [birth year] Referring Provider:  Elberta Leatherwood, MD  Encounter Date: 07/20/2014      OT End of Session - 07/20/14 1211    Visit Number 7   Number of Visits 19   Date for OT Re-Evaluation 08/25/14   Authorization Type medicaid, however, as of 07/11/14 pt reports now has BC/BS   Authorization Time Period 04/21/14-4/21/6 with MCD, (week 6/12)   Authorization - Visit Number 7   Authorization - Number of Visits 12   OT Start Time 1055   OT Stop Time 1146   OT Time Calculation (min) 51 min   Equipment Utilized During Treatment paragym, hoyer lift   Activity Tolerance Patient tolerated treatment well   Behavior During Therapy [facility] for tasks assessed/performed      Past Medical History  Diagnosis Date  . Hypertension   . DDD (degenerative disc disease), cervical   . DJD (degenerative joint disease)   . Obesity   . Chronic back pain   . 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 History of rectal fissure   . Rotator cuff tendonitis right    Past Surgical History  Procedure Laterality Date  . Tonsillectomy    . Laminectomy N/A 12/14/2013    Procedure: THORACIC LAMINECTOMY FOR TUMOR THORACIC THREE;  Surgeon: Ashok Pall, MD;  Location: [place];  Service: Neurosurgery;  Laterality: N/A;  THORACIC LAMINECTOMY FOR TUMOR THORACIC THREE  . Posterior lumbar fusion 4 level N/A 12/30/2013    Procedure: Thoracic one-Thoracic five posterior thoracic fusion with pedicle screws;  Surgeon: Ashok Pall, MD;  Location: [place] [facility];  Service: Neurosurgery;  Laterality: N/A;  Thoracic one-Thoracic five posterior thoracic fusion with pedicle screws  . Thyroidectomy N/A 01/09/2014    Procedure: TOTAL  THYROIDECTOMY;  Surgeon: Izora Gala, MD;  Location: [facility] [facility];  Service: ENT;  Laterality: N/A;    There were no vitals filed for this visit.  Visit Diagnosis:  Poor trunk control      Subjective Assessment - 07/20/14 1159    Subjective  I took that radiation pill and I couldn't leave the house for 5 days, that's why I couodn't come Tuesday   Pertinent History s/p laminectomy 12/14/13, s/p lumbar fusion 12/30/13 by Dr. Cyndy Freeze (secondary to tumor)   Patient Stated Goals be more independent and walk again                      OT Treatments/Exercises (OP) - 07/20/14 0001    Shoulder Exercises: Seated   Row AROM;Strengthening;Both;10 reps;Weights   Row Weight (lbs) 15 lbs   Other Seated Exercises Shoulder pull downs at paragym x 15 reps with 15 lbs resistance   Neurological Re-education Exercises   Shoulder Flexion AROM;Strengthening;10 reps;Both;Seated   Elbow Flexion AROM;Strengthening;Both;10 reps;Seated   Other Exercises 1 Patient transferred to mat table via hoyer lift with 2 caregivers.  While in unsupported sitting, worked with patient on balance / counterbalance in trunk in order to free upper extremities from support and free arms for furture functional tasks while seated.  Patient very anxious initially, but once she learned that weight translation backward allowed her to be stable she was able to free arms, initally one at a time, and then  both on a dowel.  Increased the challenge by encouraging bilateral UE's on dowel and weight shifting through torso forward and backward in increasingly larger degrees of motion and at varying speeds.  [name] extremely cautious, and fearful of falling / sliding - but willing to work throughout session.   Other Exercises 2 Practiced transition from sit to side sit as needed to pull hoyer lift pad into better position before transfer.  Patient hesitant initially to try a lateral weight shift, however with physical and verbal cueing able to  effectively transition down and back up.  Push ups on push up blocks x 10 while seated at edge of mat table.                  OT Education - 07/20/14 1209    Education provided Yes   Education Details Importance of use of momentum to help move lower body e.g. weight shifting hips back in chair   Person(s) Educated Patient   Methods Explanation;Demonstration   Comprehension Verbalized understanding;Returned demonstration          OT Short Term Goals - 07/11/14 1204    OT SHORT TERM GOAL #1   Title Independent w/ HEP for UE strengthening   Baseline dependent   Period --  assess after 3-6 weeks    Status Achieved   OT SHORT TERM GOAL #2   Title Pt to be Modified independence w/ UE dressing in wheelchair   Baseline min assist   Status Achieved  as of 07/11/14 with short sleeve shirt only in w/c   OT SHORT TERM GOAL #3   Title Pt to perform transfer using sliding board w/ max assist x2 in prep for using BSC   Baseline DEPENDENT - using hoyer lift   Status Not Met  still requires hoyer lift   OT SHORT TERM GOAL #4   Title Pt to perform long sitiing in bed w/ min assist in prep for LE ADLS   Status Not Met  lacks adequate trunk control            OT Long Term Goals - 07/11/14 1222    OT LONG TERM GOAL #1   Title Pt to perform static sitting EOB independently in prep for potential UB self care needs   Baseline requires assist due to trunk control   Time --  assess 6-12 weeks   Status On-going   OT LONG TERM GOAL #2   Title Pt to perform functional transfer to [facility] using sliding board w/ max assist x 1   Baseline dependent - using hoyer lift   Status On-going   OT LONG TERM GOAL #3   Title Pt to perform UB bathing in w/c with set up only   Baseline requires min assist in bed   Status On-going   OT LONG TERM GOAL #4   Title Perform LB bathing and dressing in bed w/ mod assist long sitting   Baseline dependent   Status On-going               Plan -  07/20/14 1212    Clinical Impression Statement Patient showing improvement in her ability to balance self while seated, although this is challenging, Overall pain report is lower.     Pt will benefit from skilled therapeutic intervention in order to improve on the following deficits (Retired) Decreased knowledge of use of DME;Impaired flexibility;Decreased coordination;Decreased mobility;Improper body mechanics;Decreased endurance;Decreased activity tolerance;Decreased range of motion;Decreased strength;Other (comment)  Rehab Potential Fair   Clinical Impairments Affecting Rehab Potential severity of deficits   OT Frequency 2x / week   OT Duration 6 weeks   OT Treatment/Interventions Self-care/ADL training;Neuromuscular education;Therapeutic exercise;Patient/family education;DME and/or AE instruction;Passive range of motion;Manual Therapy;Therapeutic activities   OT Home Exercise Plan Ongoing   Consulted and Agree with Plan of Care Patient        Problem List Patient Active Problem List   Diagnosis Date Noted  . Hypertension 05/01/2014  . Ingrown right big toenail 05/01/2014  . Hypokalemia 05/01/2014  . GERD (gastroesophageal reflux disease) 02/14/2014  . Dysphagia, pharyngoesophageal phase 02/06/2014  . Type 2 diabetes mellitus without complication 15/18/3437  . Constipation   . Neurogenic bowel   . 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 Metastatic cancer to spine 01/19/2014  . Rotator cuff tendonitis   . Acute postoperative respiratory failure 01/09/2014  . 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 Leg weakness, bilateral 12/13/2013    Mariah Milling, OTR/L 07/20/2014, 12:15 PM  [facility] [address] [place], [place], [address] Phone: [phone]   Fax:  [phone]

## 2014-07-21 ENCOUNTER — Encounter (HOSPITAL_COMMUNITY)
Admission: RE | Admit: 2014-07-21 | Discharge: 2014-07-21 | Disposition: A | Discharge status: Home / Self Care | Payer: BLUE CROSS/BLUE SHIELD | Source: Ambulatory Visit | Attending: Internal Medicine | Admitting: Internal Medicine

## 2014-07-21 DIAGNOSIS — C73 Malignant neoplasm of thyroid gland: Secondary | ICD-10-CM | POA: Diagnosis not present

## 2014-07-24 ENCOUNTER — Encounter (HOSPITAL_COMMUNITY): Payer: Medicaid Other

## 2014-07-24 ENCOUNTER — Telehealth: Payer: Self-pay | Admitting: Family Medicine

## 2014-07-24 NOTE — Telephone Encounter (Signed)
Medicaid has run out and now have [place] referral from Dr Alease Frame for skilled nursing service( has to say skilled nursing service) Needs to send to [place]. Sitter usually stays for 3 hrs per day. Would like to include medical supplies [facility]sa [phone]

## 2014-07-25 ENCOUNTER — Encounter
Payer: BLUE CROSS/BLUE SHIELD | Attending: Physical Medicine & Rehabilitation | Admitting: Physical Medicine & Rehabilitation

## 2014-07-25 ENCOUNTER — Encounter: Payer: Self-pay | Admitting: Physical Medicine & Rehabilitation

## 2014-07-25 VITALS — BP 105/67 | HR 101 | Resp 16

## 2014-07-25 DIAGNOSIS — R1314 Dysphagia, pharyngoesophageal phase: Secondary | ICD-10-CM | POA: Diagnosis not present

## 2014-07-25 DIAGNOSIS — G839 Paralytic syndrome, unspecified: Secondary | ICD-10-CM | POA: Insufficient documentation

## 2014-07-25 DIAGNOSIS — M6281 Muscle weakness (generalized): Secondary | ICD-10-CM | POA: Insufficient documentation

## 2014-07-25 DIAGNOSIS — R2991 Unspecified symptoms and signs involving the musculoskeletal system: Secondary | ICD-10-CM | POA: Diagnosis not present

## 2014-07-25 MED ORDER — ONDANSETRON HCL 4 MG PO TABS: 4.0000 mg | ORAL_TABLET | Freq: Four times a day (QID) | ORAL | Status: DC

## 2014-07-25 NOTE — Progress Notes (Signed)
Subjective:    Patient ID: Sarah [name], female    DOB: 1965-04-09, 49 y.o.   MRN: [id number]  HPI   Sarah [name] is here in follow up her epidural tumor and related myelopathy. She has developed more hip strength over the last month or so. She is also developing some more core strength. She's doing crunches and extension exercises also. She continues to work with neurorehab.  She has had some "choking" at home which had improved initially after getting home. She is getting choked more so on solids than anything else.   Requesting refill on zofran  Pain Inventory Average Pain 2 Pain Right Now 2 My pain is no answer  In the last 24 hours, has pain interfered with the following? General activity 0 Relation with others 0 Enjoyment of life 0 What TIME of day is your pain at its worst? night Sleep (in general) Fair  Pain is worse with: no pain Pain improves with: no pain Relief from Meds: no pain  Mobility walk without assistance walk with assistance use a cane use a walker do you drive?  no use a wheelchair needs help with transfers  Function disabled: date disabled 2016 I need assistance with the following:  dressing, bathing, toileting, meal prep, household duties and shopping  Neuro/Psych numbness tingling trouble walking spasms depression anxiety  Prior Studies Any changes since last visit?  no  Physicians involved in your care Any changes since last visit?  no   Family History  Problem Relation Age of Onset  . Hypertension Mother   . Diabetes Mother   . Hypertension Father   . Stroke Father    History   Social History  . Marital Status: Single    Spouse Name: N/A  . Number of Children: N/A  . Years of Education: N/A   Social History Main Topics  . Smoking status: Never Smoker   . Smokeless tobacco: Never Used  . Alcohol Use: No  . Drug Use: No  . Sexual Activity: Not Currently   Other Topics Concern  . None   Social History Narrative    Past Surgical History  Procedure Laterality Date  . Tonsillectomy    . Laminectomy N/A 12/14/2013    Procedure: THORACIC LAMINECTOMY FOR TUMOR THORACIC THREE;  Surgeon: Ashok Pall, MD;  Location: [place];  Service: Neurosurgery;  Laterality: N/A;  THORACIC LAMINECTOMY FOR TUMOR THORACIC THREE  . Posterior lumbar fusion 4 level N/A 12/30/2013    Procedure: Thoracic one-Thoracic five posterior thoracic fusion with pedicle screws;  Surgeon: Ashok Pall, MD;  Location: [facility];  Service: Neurosurgery;  Laterality: N/A;  Thoracic one-Thoracic five posterior thoracic fusion with pedicle screws  . Thyroidectomy N/A 01/09/2014    Procedure: TOTAL THYROIDECTOMY;  Surgeon: Izora Gala, MD;  Location: [facility];  Service: ENT;  Laterality: N/A;   Past Medical History  Diagnosis Date  . Hypertension   . DDD (degenerative disc disease), cervical   . DJD (degenerative joint disease)   . Obesity   . Chronic back pain   . 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 History of rectal fissure   . Rotator cuff tendonitis right   BP 105/67 mmHg  Pulse 101  Resp 16  SpO2 97%  Opioid Risk Score:   Fall Risk Score: Low Fall Risk (0-5 points)`1  Depression screen PHQ 2/9  Depression screen [facility] 2/9 04/20/2014 03/03/2014  Decreased Interest 0 0  Down,  Depressed, Hopeless 0 1  PHQ - 2 Score 0 1     Review of Systems  Constitutional: Positive for diaphoresis.  Gastrointestinal: Positive for abdominal pain.  Musculoskeletal: Positive for gait problem.       Spasms  Neurological: Positive for numbness.       Tingling  Psychiatric/Behavioral: Positive for dysphoric mood. The patient is nervous/anxious.   All other systems reviewed and are negative.      Objective:   Physical Exam  Constitutional: She is oriented to person, place, and time. She appears well-developed and well-nourished.  HENT:  Head: Normocephalic and atraumatic.  Neck: Normal range  of motion.  Cardiovascular: Normal rate and regular rhythm.  No murmur heard.  Respiratory: Effort normal and breath sounds normal. No respiratory distress. She has no wheezes. She exhibits no tenderness.  GI: Soft. Bowel sounds are normal. She exhibits no distension. There is no tenderness.  Musculoskeletal: She exhibits no edema and no tenderness.  Neurological: She is alert and oriented to person, place, and time.  Diminished sensation 1/2 below lower/mid chest wall. Able to sense pain Motor strength is 5/5 bilateral deltoids, biceps, triceps, grip  1+/5 bilateral hip flexors, hip add/hip abd 1+/5, tr 1 knee extensors, 1/5 knee flexion, 1+/5 ankle dorsiflexor and plantar flexors  Tone 1/4 left hip adductors, 1/4 gastrocs/hamstrings  Skin: toe nail stable  Psych: pleasant, in good spirits   Assessment/Plan:  1. 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   -pt displaying motorical gains. Trying to translate these into functional gains also 2. Pain Management: continue baclofen to better manage spasticity  -tylenol prn  -increase baclofen to '20mg'$  qid, pending a phone call from patient about her current dose.  4. Neurogenic bladder: timed voids q4-5 x per day to avoid excessive bladder volume and pressure  5. Neurogenic bowel/GI:  -suppository if needed  6. Continue with neuro-rehab: Focus should be on transfers, core strengthening, self-care, tone   -needs to work on this at home as well---would like to be able to transfer to [facility] 7. Spasticity: baclofen as above  8. Dysphagia: ?radiation esophagitis: worsening sx  -will request MBS -  Follow up with me in 2 months. Thirty minutes of face to face patient care time were spent during this visit. All questions were encouraged and answered.

## 2014-07-25 NOTE — Patient Instructions (Signed)
PLEASE CALL ME WITH ANY PROBLEMS OR QUESTIONS (#[phone]).      

## 2014-07-31 ENCOUNTER — Ambulatory Visit: Payer: BLUE CROSS/BLUE SHIELD | Attending: Family Medicine | Admitting: Physical Therapy

## 2014-07-31 DIAGNOSIS — G8220 Paraplegia, unspecified: Secondary | ICD-10-CM

## 2014-07-31 DIAGNOSIS — R2991 Unspecified symptoms and signs involving the musculoskeletal system: Secondary | ICD-10-CM | POA: Diagnosis not present

## 2014-07-31 DIAGNOSIS — G839 Paralytic syndrome, unspecified: Secondary | ICD-10-CM | POA: Insufficient documentation

## 2014-07-31 DIAGNOSIS — M6281 Muscle weakness (generalized): Secondary | ICD-10-CM | POA: Diagnosis not present

## 2014-07-31 DIAGNOSIS — Z7409 Other reduced mobility: Secondary | ICD-10-CM

## 2014-08-01 ENCOUNTER — Encounter: Payer: Self-pay | Admitting: Physical Therapy

## 2014-08-01 NOTE — Therapy (Signed)
[facility] [address] [place], [place], [address] Phone: [phone]   Fax:  [phone]  Physical Therapy Treatment  Patient Details  Name: Sarah [name] MRN: [id number] Date of Birth: [birth year] Referring Provider:  Elberta Leatherwood, MD  Encounter Date: 07/31/2014      PT End of Session - 08/01/14 1254    Visit Number 6   Number of Visits 16   Date for PT Re-Evaluation 08/31/14   Authorization Type BCBS   Authorization Time Period 03-24-14  - 03-24-15   Authorization - Visit Number 1   Authorization - Number of Visits 30   PT Start Time 1120   PT Stop Time 1145   PT Time Calculation (min) 25 min      Past Medical History  Diagnosis Date  . Hypertension   . DDD (degenerative disc disease), cervical   . DJD (degenerative joint disease)   . Obesity   . Chronic back pain   . 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 History of rectal fissure   . Rotator cuff tendonitis right    Past Surgical History  Procedure Laterality Date  . Tonsillectomy    . Laminectomy N/A 12/14/2013    Procedure: THORACIC LAMINECTOMY FOR TUMOR THORACIC THREE;  Surgeon: Ashok Pall, MD;  Location: [place] [facility];  Service: Neurosurgery;  Laterality: N/A;  THORACIC LAMINECTOMY FOR TUMOR THORACIC THREE  . Posterior lumbar fusion 4 level N/A 12/30/2013    Procedure: Thoracic one-Thoracic five posterior thoracic fusion with pedicle screws;  Surgeon: Ashok Pall, MD;  Location: [place] [facility];  Service: Neurosurgery;  Laterality: N/A;  Thoracic one-Thoracic five posterior thoracic fusion with pedicle screws  . Thyroidectomy N/A 01/09/2014    Procedure: TOTAL THYROIDECTOMY;  Surgeon: Izora Gala, MD;  Location: [facility];  Service: ENT;  Laterality: N/A;    There were no vitals filed for this visit.  Visit Diagnosis:  Muscle weakness (generalized) - Plan: PT plan of care cert/re-cert  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 -  Plan: PT plan of care cert/re-cert  Decreased functional mobility and endurance - Plan: PT plan of care cert/re-cert      Subjective Assessment - 08/01/14 1044    Subjective Pt. arrived 20" late due to traffic on [address]; states she has been really working on trying to move and exercise her legs   Patient Stated Goals Improve mobility   Currently in Pain? No/denies    Discussed need to begin sliding board transfers - pt. Fearful of falling and requests to not attempt this transfer today  TherEx:  Passive hamstring stretching x 30 sec hold x 3 reps - contract/relax x 5 reps each leg; active quad extension Pt. Performed LAQ x 10 reps each LE - with 2 sec hold attempted; Seated hip flexion x 10 reps assisted RLE and LLE                             PT Short Term Goals - 08/01/14 1045    PT SHORT TERM GOAL #1   Title Pt. will transfer with sliding board wheelchair to mat with +1 max assist.  (target date 08-31-14)   Baseline not met as of 07-31-14; pt. declined to attempt due to decr. time available in appt. due to late arrival and also declined attempting today due to fear - pt. has only been using hoyer lift at  home   Time 4   Status On-going   PT SHORT TERM GOAL #2   Title Sit unsupported on side of mat x 5" with +2 mod assist   Status Achieved   PT SHORT TERM GOAL #3   Title Independent to direct HEP (stretches and PROM for bil. LE's)   Status Achieved   PT SHORT TERM GOAL #4   Title Tolerate standing in standing frame x 10" for LE stretching and weight-bearing   (08-31-14)   Baseline Not yet attempted - pt. cont to only use hoyer lift for transfers and has significant fear of falling   Time 4   Period Weeks   Status New   PT SHORT TERM GOAL #5   Title Incr. endurance/activity tolerance so pt. able to perform 10' nonstop on recumbent bike Comptroller)  (08-31-14)   Time 4   Period Weeks   Status New           PT Long Term Goals - 08/01/14 1052    PT LONG  TERM GOAL #1   Title Transfer with sliding board with +1 mod assist wheelchair to/from mat   Baseline Pt. requires hoyer lift - unable to use sliding board.     Target date 09-30-14   Time 8   Status On-going   PT LONG TERM GOAL #2   Title Sit on side of mat for at least 10" with UE support prn with SBA to incr. independence with dressing, grooming,etc. at home  (09-30-14)   Time 8   Period Weeks   Status On-going   PT LONG TERM GOAL #3   Title Sit unsupported on side of mat with 1 UE support and reach 4" anteriorly with other UE support with min assist to increase independence with ADL's with mod  (09-30-14)   Time 8   Period Weeks   Status On-going   PT LONG TERM GOAL #4   Title Independent in updated HEP  (09-30-14)   Time 8   Period Weeks   Status New               Plan - 08/01/14 1258    Clinical Impression Statement Pt. able to actively extend bil. LE's, demonstrating incr. quad strength/return; pt. unable to flex hips in seated position in wheelchair   Pt will benefit from skilled therapeutic intervention in order to improve on the following deficits Decreased mobility;Obesity;Decreased range of motion;Impaired flexibility;Increased muscle spasms;Decreased balance;Decreased strength   Rehab Potential Fair   PT Frequency 2x / week   PT Duration 8 weeks   PT Treatment/Interventions ADLs/Self Care Home Management;Therapeutic activities;Patient/family education;Therapeutic exercise;Wheelchair mobility training;Balance training;Neuromuscular re-education;Functional mobility training   PT Next Visit Plan use sliding board to transfer out of wheelchair; cont ther ex   PT Home Exercise Plan LE ROM and stretching - will add strengthening as pt able to perform        Problem List Patient Active Problem List   Diagnosis Date Noted  . Hypertension 05/01/2014  . Ingrown right big toenail 05/01/2014  . Hypokalemia 05/01/2014  . GERD (gastroesophageal reflux disease) 02/14/2014  .  Dysphagia, pharyngoesophageal phase 02/06/2014  . Type 2 diabetes mellitus without complication 62/69/4854  . Constipation   . Neurogenic bowel   . 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 Metastatic cancer to spine 01/19/2014  . Rotator cuff tendonitis   . Acute postoperative respiratory failure 01/09/2014  . 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 Leg weakness, bilateral 12/13/2013    Carlise Stofer, [place], PT 08/01/2014, 1:10 PM  [facility] [address] [place], [place], [address] Phone: [phone]   Fax:  [phone]

## 2014-08-02 ENCOUNTER — Other Ambulatory Visit: Payer: Self-pay | Admitting: Family Medicine

## 2014-08-02 ENCOUNTER — Ambulatory Visit: Payer: BLUE CROSS/BLUE SHIELD | Admitting: Physical Therapy

## 2014-08-02 DIAGNOSIS — G839 Paralytic syndrome, unspecified: Secondary | ICD-10-CM | POA: Diagnosis not present

## 2014-08-02 DIAGNOSIS — M6281 Muscle weakness (generalized): Secondary | ICD-10-CM

## 2014-08-02 DIAGNOSIS — R29818 Other symptoms and signs involving the nervous system: Secondary | ICD-10-CM

## 2014-08-02 DIAGNOSIS — G8220 Paraplegia, unspecified: Secondary | ICD-10-CM

## 2014-08-02 DIAGNOSIS — R2991 Unspecified symptoms and signs involving the musculoskeletal system: Secondary | ICD-10-CM

## 2014-08-03 ENCOUNTER — Encounter: Payer: Self-pay | Admitting: Physical Therapy

## 2014-08-03 NOTE — Therapy (Signed)
[facility] [address] [place], [place], [address] Phone: [phone]   Fax:  [phone]  Physical Therapy Treatment  Patient Details  Name: Sarah [name] MRN: [id number] Date of Birth: 1965-05-09 Referring Provider:  Elberta Leatherwood, MD  Encounter Date: 08/02/2014      PT End of Session - 08/03/14 1336    Visit Number 7   Number of Visits 16   Date for PT Re-Evaluation 08/31/14   Authorization Type BCBS   Authorization Time Period 03-24-14  - 03-24-15   Authorization - Visit Number 2   Authorization - Number of Visits 30   PT Start Time 1201   PT Stop Time 1233   PT Time Calculation (min) 32 min   Equipment Utilized During Treatment Gait belt      Past Medical History  Diagnosis Date  . Hypertension   . DDD (degenerative disc disease), cervical   . DJD (degenerative joint disease)   . Obesity   . Chronic back pain   . 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 History of rectal fissure   . Rotator cuff tendonitis right    Past Surgical History  Procedure Laterality Date  . Tonsillectomy    . Laminectomy N/A 12/14/2013    Procedure: THORACIC LAMINECTOMY FOR TUMOR THORACIC THREE;  Surgeon: Ashok Pall, MD;  Location: [facility];  Service: Neurosurgery;  Laterality: N/A;  THORACIC LAMINECTOMY FOR TUMOR THORACIC THREE  . Posterior lumbar fusion 4 level N/A 12/30/2013    Procedure: Thoracic one-Thoracic five posterior thoracic fusion with pedicle screws;  Surgeon: Ashok Pall, MD;  Location: [facility];  Service: Neurosurgery;  Laterality: N/A;  Thoracic one-Thoracic five posterior thoracic fusion with pedicle screws  . Thyroidectomy N/A 01/09/2014    Procedure: TOTAL THYROIDECTOMY;  Surgeon: Izora Gala, MD;  Location: [facility];  Service: ENT;  Laterality: N/A;    There were no vitals filed for this visit.  Visit Diagnosis:  Muscle weakness (generalized)  Poor trunk  control  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      Subjective Assessment - 08/03/14 1332    Subjective Pt. reports no new problems - 15" late today "construction going on"   Patient Stated Goals Improve mobility   Currently in Pain? No/denies                         [facility] Adult PT Treatment/Exercise - 08/03/14 0001    Transfers   Transfers Lateral/Scoot Transfers   Lateral/Scoot Transfers With armrests removed;4: Min assist  technician present for safety and for assist. prn   Lateral/Scoot Transfer Details (indicate cue type and reason) assist with trunk stability and to position feet during transfer   Transfer via Lift Equipment --  sliding board     TherAct; standing frame - 5" standing upright for LE stretching - scapular squeezes x 10 reps for upper thoracic  Strengthening Sit to R and L 1/2 sitting with return to upright x 3 reps each side - CGA to min assist needed for return to upright  TherEx; Scifit level 1.5 x 5" with UE's and LE's             PT Short Term Goals - 08/01/14 1045    PT SHORT TERM GOAL #1   Title Pt. will transfer with sliding board wheelchair to mat with +1 max assist.  (target date 08-31-14)   Baseline  not met as of 07-31-14; pt. declined to attempt due to decr. time available in appt. due to late arrival and also declined attempting today due to fear - pt. has only been using hoyer lift at home   Time 4   Status On-going   PT SHORT TERM GOAL #2   Title Sit unsupported on side of mat x 5" with +2 mod assist   Status Achieved   PT SHORT TERM GOAL #3   Title Independent to direct HEP (stretches and PROM for bil. LE's)   Status Achieved   PT SHORT TERM GOAL #4   Title Tolerate standing in standing frame x 10" for LE stretching and weight-bearing   (08-31-14)   Baseline Not yet attempted - pt. cont to only use hoyer lift for transfers and has significant fear of falling   Time 4   Period Weeks   Status New   PT SHORT TERM GOAL #5   Title  Incr. endurance/activity tolerance so pt. able to perform 10' nonstop on recumbent bike Comptroller)  (08-31-14)   Time 4   Period Weeks   Status New           PT Long Term Goals - 08/01/14 1052    PT LONG TERM GOAL #1   Title Transfer with sliding board with +1 mod assist wheelchair to/from mat   Baseline Pt. requires hoyer lift - unable to use sliding board.     Target date 09-30-14   Time 8   Status On-going   PT LONG TERM GOAL #2   Title Sit on side of mat for at least 10" with UE support prn with SBA to incr. independence with dressing, grooming,etc. at home  (09-30-14)   Time 8   Period Weeks   Status On-going   PT LONG TERM GOAL #3   Title Sit unsupported on side of mat with 1 UE support and reach 4" anteriorly with other UE support with min assist to increase independence with ADL's with mod  (09-30-14)   Time 8   Period Weeks   Status On-going   PT LONG TERM GOAL #4   Title Independent in updated HEP  (09-30-14)   Time 8   Period Weeks   Status New               Plan - 08/03/14 1337    Clinical Impression Statement Pt. transferred with use of sliding board for first time today - pt. very fearful of falling - needs reassurance that she is not going to fall; pt. also stood in standing frame today for first time   Pt will benefit from skilled therapeutic intervention in order to improve on the following deficits Decreased mobility;Obesity;Decreased range of motion;Impaired flexibility;Increased muscle spasms;Decreased balance;Decreased strength   Rehab Potential Good   PT Frequency --   PT Duration --   PT Treatment/Interventions ADLs/Self Care Home Management;Therapeutic activities;Patient/family education;Therapeutic exercise;Wheelchair mobility training;Balance training;Neuromuscular re-education;Functional mobility training   PT Next Visit Plan use sliding board to transfer out of wheelchair; cont ther ex   PT Home Exercise Plan LE ROM and stretching - will add  strengthening as pt able to perform   Consulted and Agree with Plan of Care --        Problem List Patient Active Problem List   Diagnosis Date Noted  . Hypertension 05/01/2014  . Ingrown right big toenail 05/01/2014  . Hypokalemia 05/01/2014  . GERD (gastroesophageal reflux disease) 02/14/2014  . Dysphagia, pharyngoesophageal phase  02/06/2014  . Type 2 diabetes mellitus without complication 09/38/1829  . Constipation   . Neurogenic bowel   . 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 Metastatic cancer to spine 01/19/2014  . Rotator cuff tendonitis   . Acute postoperative respiratory failure 01/09/2014  . 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 Leg weakness, bilateral 12/13/2013    Alda Lea, PT 08/03/2014, 1:50 PM  [facility] [address] [place], [place], [address] Phone: [phone]   Fax:  [phone]

## 2014-08-07 ENCOUNTER — Other Ambulatory Visit (HOSPITAL_COMMUNITY): Payer: Self-pay | Admitting: Family Medicine

## 2014-08-07 DIAGNOSIS — R1314 Dysphagia, pharyngoesophageal phase: Secondary | ICD-10-CM

## 2014-08-08 ENCOUNTER — Other Ambulatory Visit (HOSPITAL_COMMUNITY): Payer: BLUE CROSS/BLUE SHIELD

## 2014-08-08 ENCOUNTER — Ambulatory Visit (HOSPITAL_COMMUNITY): Payer: BLUE CROSS/BLUE SHIELD

## 2014-08-14 ENCOUNTER — Ambulatory Visit: Payer: BLUE CROSS/BLUE SHIELD | Admitting: Physical Therapy

## 2014-08-14 DIAGNOSIS — G839 Paralytic syndrome, unspecified: Secondary | ICD-10-CM | POA: Diagnosis not present

## 2014-08-14 DIAGNOSIS — G8220 Paraplegia, unspecified: Secondary | ICD-10-CM

## 2014-08-14 DIAGNOSIS — M6281 Muscle weakness (generalized): Secondary | ICD-10-CM

## 2014-08-15 ENCOUNTER — Other Ambulatory Visit: Payer: Self-pay | Admitting: Family Medicine

## 2014-08-15 ENCOUNTER — Ambulatory Visit: Payer: BLUE CROSS/BLUE SHIELD | Admitting: Physical Therapy

## 2014-08-15 ENCOUNTER — Encounter: Payer: Self-pay | Admitting: Physical Therapy

## 2014-08-15 DIAGNOSIS — G8220 Paraplegia, unspecified: Secondary | ICD-10-CM

## 2014-08-15 DIAGNOSIS — G839 Paralytic syndrome, unspecified: Secondary | ICD-10-CM | POA: Diagnosis not present

## 2014-08-15 DIAGNOSIS — M6281 Muscle weakness (generalized): Secondary | ICD-10-CM

## 2014-08-15 NOTE — Therapy (Signed)
[facility] [address] [place], [place], [address] Phone: [phone]   Fax:  [phone]  Physical Therapy Treatment  Patient Details  Name: Sarah [name] MRN: [id number] Date of Birth: 1965/04/09 Referring Provider:  Elberta Leatherwood, MD  Encounter Date: 08/14/2014      PT End of Session - 08/15/14 2148    Visit Number 8   Number of Visits 16   Date for PT Re-Evaluation 08/31/14   Authorization Type BCBS   Authorization Time Period 03-24-14  - 03-24-15   Authorization - Visit Number 3   Authorization - Number of Visits 30   PT Start Time 0935   PT Stop Time 1019   PT Time Calculation (min) 44 min   Equipment Utilized During Treatment Gait belt      Past Medical History  Diagnosis Date  . Hypertension   . DDD (degenerative disc disease), cervical   . DJD (degenerative joint disease)   . Obesity   . Chronic back pain   . 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 History of rectal fissure   . Rotator cuff tendonitis right    Past Surgical History  Procedure Laterality Date  . Tonsillectomy    . Laminectomy N/A 12/14/2013    Procedure: THORACIC LAMINECTOMY FOR TUMOR THORACIC THREE;  Surgeon: Ashok Pall, MD;  Location: [facility];  Service: Neurosurgery;  Laterality: N/A;  THORACIC LAMINECTOMY FOR TUMOR THORACIC THREE  . Posterior lumbar fusion 4 level N/A 12/30/2013    Procedure: Thoracic one-Thoracic five posterior thoracic fusion with pedicle screws;  Surgeon: Ashok Pall, MD;  Location: [facility];  Service: Neurosurgery;  Laterality: N/A;  Thoracic one-Thoracic five posterior thoracic fusion with pedicle screws  . Thyroidectomy N/A 01/09/2014    Procedure: TOTAL THYROIDECTOMY;  Surgeon: Izora Gala, MD;  Location: [facility];  Service: ENT;  Laterality: N/A;    There were no vitals filed for this visit.  Visit Diagnosis:  Muscle weakness (generalized)  Paraplegia at  T4 level      Subjective Assessment - 08/15/14 2146    Subjective Pt. reports doing some exercises at home   Patient Stated Goals Improve mobility   Currently in Pain? No/denies                         [facility] Adult PT Treatment/Exercise - 08/15/14 0001    Transfers   Transfers Lateral/Scoot Transfers   Lateral/Scoot Transfers With armrests removed;4: Min assist  technician present for safety and for assist. prn   Lateral/Scoot Transfer Details (indicate cue type and reason) min assist with sliding board      Neuro Re-ed: sitting balance activity - reaching, sit to R and L 1/2 sitting propped on forearm with return to upright Trunk extension strengthening - leaning back on chair x 10 reps with min to mod assist  Scifit level 1.5 x 5" with UE and LE's            PT Short Term Goals - 08/01/14 1045    PT SHORT TERM GOAL #1   Title Pt. will transfer with sliding board wheelchair to mat with +1 max assist.  (target date 08-31-14)   Baseline not met as of 07-31-14; pt. declined to attempt due to decr. time available in appt. due to late arrival and also declined attempting today due to fear - pt. has only been using hoyer  lift at home   Time 4   Status On-going   PT SHORT TERM GOAL #2   Title Sit unsupported on side of mat x 5" with +2 mod assist   Status Achieved   PT SHORT TERM GOAL #3   Title Independent to direct HEP (stretches and PROM for bil. LE's)   Status Achieved   PT SHORT TERM GOAL #4   Title Tolerate standing in standing frame x 10" for LE stretching and weight-bearing   (08-31-14)   Baseline Not yet attempted - pt. cont to only use hoyer lift for transfers and has significant fear of falling   Time 4   Period Weeks   Status New   PT SHORT TERM GOAL #5   Title Incr. endurance/activity tolerance so pt. able to perform 10' nonstop on recumbent bike Comptroller)  (08-31-14)   Time 4   Period Weeks   Status New           PT Long Term Goals -  08/01/14 1052    PT LONG TERM GOAL #1   Title Transfer with sliding board with +1 mod assist wheelchair to/from mat   Baseline Pt. requires hoyer lift - unable to use sliding board.     Target date 09-30-14   Time 8   Status On-going   PT LONG TERM GOAL #2   Title Sit on side of mat for at least 10" with UE support prn with SBA to incr. independence with dressing, grooming,etc. at home  (09-30-14)   Time 8   Period Weeks   Status On-going   PT LONG TERM GOAL #3   Title Sit unsupported on side of mat with 1 UE support and reach 4" anteriorly with other UE support with min assist to increase independence with ADL's with mod  (09-30-14)   Time 8   Period Weeks   Status On-going   PT LONG TERM GOAL #4   Title Independent in updated HEP  (09-30-14)   Time 8   Period Weeks   Status New               Plan - 08/15/14 2149    Clinical Impression Statement pt. remains very fearful of falling - this limits mobility   Pt will benefit from skilled therapeutic intervention in order to improve on the following deficits Decreased mobility;Obesity;Decreased range of motion;Impaired flexibility;Increased muscle spasms;Decreased balance;Decreased strength   Rehab Potential Good   PT Frequency 2x / week   PT Duration 8 weeks   PT Treatment/Interventions ADLs/Self Care Home Management;Therapeutic activities;Patient/family education;Therapeutic exercise;Wheelchair mobility training;Balance training;Neuromuscular re-education;Functional mobility training   PT Next Visit Plan use sliding board to transfer out of wheelchair; cont ther ex   PT Home Exercise Plan LE ROM and stretching - will add strengthening as pt able to perform   Consulted and Agree with Plan of Care Patient        Problem List Patient Active Problem List   Diagnosis Date Noted  . Hypertension 05/01/2014  . Ingrown right big toenail 05/01/2014  . Hypokalemia 05/01/2014  . GERD (gastroesophageal reflux disease) 02/14/2014  .  Dysphagia, pharyngoesophageal phase 02/06/2014  . Type 2 diabetes mellitus without complication 67/34/1937  . Constipation   . Neurogenic bowel   . 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 Metastatic cancer to spine 01/19/2014  . Rotator cuff tendonitis   . Acute postoperative respiratory failure 01/09/2014  . 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 Leg weakness, bilateral 12/13/2013    Alda Lea, PT 08/15/2014, 9:51 PM  [facility] [address] [place], [place], [address] Phone: [phone]   Fax:  [phone]

## 2014-08-16 ENCOUNTER — Encounter: Payer: Self-pay | Admitting: Physical Therapy

## 2014-08-16 ENCOUNTER — Ambulatory Visit (HOSPITAL_COMMUNITY)
Admission: RE | Admit: 2014-08-16 | Discharge: 2014-08-16 | Disposition: A | Discharge status: Home / Self Care | Payer: BLUE CROSS/BLUE SHIELD | Source: Ambulatory Visit | Attending: Physical Medicine & Rehabilitation | Admitting: Physical Medicine & Rehabilitation

## 2014-08-16 ENCOUNTER — Ambulatory Visit (HOSPITAL_COMMUNITY)
Admission: RE | Admit: 2014-08-16 | Discharge: 2014-08-16 | Disposition: A | Discharge status: Home / Self Care | Payer: BLUE CROSS/BLUE SHIELD | Source: Ambulatory Visit | Attending: Family Medicine | Admitting: Family Medicine

## 2014-08-16 DIAGNOSIS — R1314 Dysphagia, pharyngoesophageal phase: Secondary | ICD-10-CM | POA: Diagnosis present

## 2014-08-16 NOTE — Therapy (Signed)
[facility] [address] [place], [place], [address] Phone: [phone]   Fax:  [phone]  Physical Therapy Treatment  Patient Details  Name: Sarah [name] MRN: [id number] Date of Birth: 06-Oct-1965 Referring Provider:  Elberta Leatherwood, MD  Encounter Date: 08/15/2014      PT End of Session - 08/16/14 1447    Visit Number 9   Number of Visits 16   Date for PT Re-Evaluation 08/31/14   Authorization Type BCBS   Authorization Time Period 03-24-14  - 03-24-15   Authorization - Visit Number 4   Authorization - Number of Visits 30   PT Start Time 0932   PT Stop Time 1015   PT Time Calculation (min) 43 min      Past Medical History  Diagnosis Date  . Hypertension   . DDD (degenerative disc disease), cervical   . DJD (degenerative joint disease)   . Obesity   . Chronic back pain   . 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 History of rectal fissure   . Rotator cuff tendonitis right    Past Surgical History  Procedure Laterality Date  . Tonsillectomy    . Laminectomy N/A 12/14/2013    Procedure: THORACIC LAMINECTOMY FOR TUMOR THORACIC THREE;  Surgeon: Ashok Pall, MD;  Location: [facility];  Service: Neurosurgery;  Laterality: N/A;  THORACIC LAMINECTOMY FOR TUMOR THORACIC THREE  . Posterior lumbar fusion 4 level N/A 12/30/2013    Procedure: Thoracic one-Thoracic five posterior thoracic fusion with pedicle screws;  Surgeon: Ashok Pall, MD;  Location: [facility];  Service: Neurosurgery;  Laterality: N/A;  Thoracic one-Thoracic five posterior thoracic fusion with pedicle screws  . Thyroidectomy N/A 01/09/2014    Procedure: TOTAL THYROIDECTOMY;  Surgeon: Izora Gala, MD;  Location: [facility];  Service: ENT;  Laterality: N/A;    There were no vitals filed for this visit.  Visit Diagnosis:  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  Muscle weakness (generalized)      Subjective Assessment -  08/16/14 1441    Subjective Pt. reports feeling better today but does not want to transfer out of wheelchair   Patient Stated Goals Improve mobility   Currently in Pain? No/denies                         [facility] Adult PT Treatment/Exercise - 08/16/14 0001    Knee/Hip Exercises: Stretches   Passive Hamstring Stretch 3 reps;30 seconds   Passive Hamstring Stretch Limitations pain with stretching   Knee/Hip Exercises: Supine   Quad Sets AROM;10 reps   Terminal Knee Extension AROM;2 sets;10 reps  with no resistance for 1st set 10 reps then with red theraba   Knee/Hip Exercises: Sidelying   Hip ABduction AROM;3 sets;10 reps  with use of red therabnd for resistance   Shoulder Exercises: Seated   Retraction AROM;10 reps;Theraband  red   Theraband Level (Shoulder Retraction) Level 2 (Red)   Horizontal ABduction AROM;10 reps   Theraband Level (Shoulder Horizontal ABduction) Level 2 (Red)   Flexion AROM;10 reps   Theraband Level (Shoulder Flexion) Level 2 (Red)     TherAct; Zoomball played x 3 sets 10 reps with pt seated in w/c             PT Short Term Goals - 08/01/14 1045    PT SHORT TERM GOAL #1   Title Pt. will transfer with sliding board  wheelchair to mat with +1 max assist.  (target date 08-31-14)   Baseline not met as of 07-31-14; pt. declined to attempt due to decr. time available in appt. due to late arrival and also declined attempting today due to fear - pt. has only been using hoyer lift at home   Time 4   Status On-going   PT SHORT TERM GOAL #2   Title Sit unsupported on side of mat x 5" with +2 mod assist   Status Achieved   PT SHORT TERM GOAL #3   Title Independent to direct HEP (stretches and PROM for bil. LE's)   Status Achieved   PT SHORT TERM GOAL #4   Title Tolerate standing in standing frame x 10" for LE stretching and weight-bearing   (08-31-14)   Baseline Not yet attempted - pt. cont to only use hoyer lift for transfers and has significant  fear of falling   Time 4   Period Weeks   Status New   PT SHORT TERM GOAL #5   Title Incr. endurance/activity tolerance so pt. able to perform 10' nonstop on recumbent bike Comptroller)  (08-31-14)   Time 4   Period Weeks   Status New           PT Long Term Goals - 08/01/14 1052    PT LONG TERM GOAL #1   Title Transfer with sliding board with +1 mod assist wheelchair to/from mat   Baseline Pt. requires hoyer lift - unable to use sliding board.     Target date 09-30-14   Time 8   Status On-going   PT LONG TERM GOAL #2   Title Sit on side of mat for at least 10" with UE support prn with SBA to incr. independence with dressing, grooming,etc. at home  (09-30-14)   Time 8   Period Weeks   Status On-going   PT LONG TERM GOAL #3   Title Sit unsupported on side of mat with 1 UE support and reach 4" anteriorly with other UE support with min assist to increase independence with ADL's with mod  (09-30-14)   Time 8   Period Weeks   Status On-going   PT LONG TERM GOAL #4   Title Independent in updated HEP  (09-30-14)   Time 8   Period Weeks   Status New               Plan - 08/16/14 1447    Clinical Impression Statement Pt.'s mobility is limited by fear of falling and often not feeling well - pt c/o tightnes (spasticity??) in stomach/anterior abdomen with exercise; pt. requested not to transfer out of wheelchair today and requested not to ride bike due to not feeling well   Pt will benefit from skilled therapeutic intervention in order to improve on the following deficits Decreased mobility;Obesity;Decreased range of motion;Impaired flexibility;Increased muscle spasms;Decreased balance;Decreased strength   Rehab Potential Good   PT Frequency 2x / week   PT Duration 8 weeks   PT Treatment/Interventions ADLs/Self Care Home Management;Therapeutic activities;Patient/family education;Therapeutic exercise;Wheelchair mobility training;Balance training;Neuromuscular re-education;Functional mobility  training   PT Next Visit Plan use sliding board to transfer out of wheelchair; cont ther ex   PT Home Exercise Plan LE ROM and stretching - gave pt red therabnd and instructed to do hip abduction /adduction in w/c in seated position   Consulted and Agree with Plan of Care Patient        Problem List Patient Active Problem List  Diagnosis Date Noted  . Hypertension 05/01/2014  . Ingrown right big toenail 05/01/2014  . Hypokalemia 05/01/2014  . GERD (gastroesophageal reflux disease) 02/14/2014  . Dysphagia, pharyngoesophageal phase 02/06/2014  . Type 2 diabetes mellitus without complication 73/10/5692  . Constipation   . Neurogenic bowel   . 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 Metastatic cancer to spine 01/19/2014  . Rotator cuff tendonitis   . Acute postoperative respiratory failure 01/09/2014  . 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 Leg weakness, bilateral 12/13/2013    PTCKFW, BLTGA IDKSMMO, PT 08/16/2014, 2:52 PM  [facility] [address] [place], [place], [address] Phone: [phone]   Fax:  [phone]

## 2014-08-16 NOTE — Progress Notes (Signed)
   08/16/14 1300  SLP Visit Information  SLP Received On 08/16/14  Subjective  Subjective Pt alert and cooperative.  Patient/Family Stated Goal "I want to know why I am choking on food."  Pain Assessment  Pain Assessment No/denies pain  General Information  Other Pertinent Information 49 yo female referred for OP MBS by Dr. Naaman Plummer, due to complaints of dysphagia with solid foods (meats, pills).  Pt denies difficulty swallowing liquids.  PMH Throacic lamiinectomy for tumor 12/14/13, with paraplegia, s/p total thyroidectomy 01/09/14.  Pt reports noticing this swallowing problem ~2-3 months ago.    Type of Study (MBS)  Reason for Referral Objectively evaluate swallowing function  Previous Swallow Assessment Bedside only (pe pt report)  Diet Prior to this Study Regular;Thin liquids  Temperature Spikes Noted No  Respiratory Status Room air  History of Recent Intubation No  Behavior/Cognition Alert;Cooperative  Oral Cavity - Dentition Adequate natural dentition/normal for age  Oral Motor / Sensory Function WFL  Self-Feeding Abilities Able to feed self  Patient Positioning Upright in chair/Tumbleform  Baseline Vocal Quality Normal  Volitional Cough Strong  Volitional Swallow Able to elicit  Anatomy WFL (obese)  Pharyngeal Secretions Not observed secondary MBS  Oral Assessment (Complete on admission/transfer/change in patient condition)  Does patient have any of the following "high risk" factors? None of the above  Does patient have any of the following "at risk" factors? None of the above  [name] LOW RISK Follow universal precautions (see row information)  Oral Preparation/Oral Phase  Oral Phase WFL  Pharyngeal Phase  Pharyngeal Phase WFL  Cervical Esophageal Phase  Cervical Esophageal Phase [facility]  Clinical Impression  Therapy Diagnosis Suspected primary esophageal dysphagia  Clinical Impression Pt has a normal oropharyngeal swallow, with no problem masticating solids, initiating  swallow, and no aspiration or penetration with any consistency presented.  The 112.18m barium tablet did lodge at the T3-4 region, with question of struicture vs. compression of the esophagus due to rods placed for laminectomy.  No radiologist was present for thsi study, therefore further w/u may be beneficial.  Pt was instructed to eat a Soft Mechanical Diet, avoiding tough meats and large pills.  Thin liquids are allowed.    Swallow Evaluation Recommendations  Recommended Consults Consider GI evaluation;Consider esophageal assessment  SLP Diet Recommendations Dysphagia 3 (Mech soft);Thin  Liquid Administration via Cup  Medication Administration Whole meds with puree  Supervision Patient able to self feed;Intermittent supervision to cue for compensatory strategies  Compensations Slow rate;Small sips/bites  Postural Changes Seated upright at 90 degrees  Treatment Plan  Oral Care Recommendations Oral care BID;Patient independent with oral care  Other Recommendations Clarify dietary restrictions  Treatment Recommendations No treatment recommended at this time  Interventions Aspiration precaution training;Patient/family education  Prognosis  Prognosis for Safe Diet Advancement Fair  Individuals Consulted  Consulted and Agree with Results and Recommendations Patient  Report Sent to  Referring physician  SLP Time Calculation  SLP Start Time (ACUTE ONLY) 1200  SLP Stop Time (ACUTE ONLY) 1235  SLP Time Calculation (min) (ACUTE ONLY) 35 min  SLP G-Codes **NOT FOR INPATIENT CLASS**  Functional Assessment Tool Used clinical judgement  Functional Limitations Swallowing  Swallow Current Status (([id number] CI  Swallow Goal Status (([id number] CI  Swallow Discharge Status (([id number] CI  SLP Evaluations  $ SLP Speech Visit 1 Procedure  SLP Evaluations  $MBS Swallow 1 Procedure  $Swallowing Treatment 1 Procedure

## 2014-08-22 ENCOUNTER — Ambulatory Visit: Payer: BLUE CROSS/BLUE SHIELD | Admitting: Physical Therapy

## 2014-08-22 DIAGNOSIS — R2991 Unspecified symptoms and signs involving the musculoskeletal system: Secondary | ICD-10-CM

## 2014-08-22 DIAGNOSIS — M6281 Muscle weakness (generalized): Secondary | ICD-10-CM

## 2014-08-22 DIAGNOSIS — G839 Paralytic syndrome, unspecified: Secondary | ICD-10-CM | POA: Diagnosis not present

## 2014-08-22 DIAGNOSIS — G8220 Paraplegia, unspecified: Secondary | ICD-10-CM

## 2014-08-22 DIAGNOSIS — R29818 Other symptoms and signs involving the nervous system: Secondary | ICD-10-CM

## 2014-08-23 ENCOUNTER — Encounter: Payer: Self-pay | Admitting: Physical Therapy

## 2014-08-23 NOTE — Therapy (Signed)
[facility] [address] [place], [place], [address] Phone: [phone]   Fax:  [phone]  Physical Therapy Treatment  Patient Details  Name: Sarah [name] MRN: [id number] Date of Birth: 1965-08-26 Referring Provider:  Elberta Leatherwood, MD  Encounter Date: 08/22/2014      PT End of Session - 08/23/14 0904    Visit Number 10   Number of Visits 16   Date for PT Re-Evaluation 08/31/14   Authorization Type BCBS   Authorization Time Period 03-24-14  - 03-24-15   Authorization - Visit Number 5   Authorization - Number of Visits 30   PT Start Time 4627   PT Stop Time 1150   PT Time Calculation (min) 47 min   Equipment Utilized During Treatment Gait belt      Past Medical History  Diagnosis Date  . Hypertension   . DDD (degenerative disc disease), cervical   . DJD (degenerative joint disease)   . Obesity   . Chronic back pain   . 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 History of rectal fissure   . Rotator cuff tendonitis right    Past Surgical History  Procedure Laterality Date  . Tonsillectomy    . Laminectomy N/A 12/14/2013    Procedure: THORACIC LAMINECTOMY FOR TUMOR THORACIC THREE;  Surgeon: Ashok Pall, MD;  Location: [facility];  Service: Neurosurgery;  Laterality: N/A;  THORACIC LAMINECTOMY FOR TUMOR THORACIC THREE  . Posterior lumbar fusion 4 level N/A 12/30/2013    Procedure: Thoracic one-Thoracic five posterior thoracic fusion with pedicle screws;  Surgeon: Ashok Pall, MD;  Location: [facility];  Service: Neurosurgery;  Laterality: N/A;  Thoracic one-Thoracic five posterior thoracic fusion with pedicle screws  . Thyroidectomy N/A 01/09/2014    Procedure: TOTAL THYROIDECTOMY;  Surgeon: Izora Gala, MD;  Location: [facility];  Service: ENT;  Laterality: N/A;    There were no vitals filed for this visit.  Visit Diagnosis:  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  Poor trunk  control  Muscle weakness (generalized)      Subjective Assessment - 08/23/14 0856    Subjective Sister accompanying patient to PT - states she wants her to be able to transfer without hoyer lift because they are going to the beach in a few weeks   [name] accompained by: Family member  sister   Patient Stated Goals Improve mobility   Currently in Pain? No/denies                         [facility] Adult PT Treatment/Exercise - 08/23/14 0001    Bed Mobility   Bed Mobility Rolling Right;Rolling Left;Supine to Sit;Sit to Supine   Rolling Right 4: Min assist   Rolling Right Details (indicate cue type and reason) min assist to position LLE; tactile cues on L pelvis   Rolling Left 4: Min assist   Rolling Left Details (indicate cue type and reason) min assist to posiiton RLE   Supine to Sit 3: Mod assist;2: Max assist   Sit to Supine 3: Mod assist  to transfer LE's onto mat   Sit to Supine - Details (indicate cue type and reason) cues to get LUE under her to assist with pushing up   Transfers   Transfers Lateral/Scoot Transfers   Lateral/Scoot Transfers 4: Min IT sales professional --  sliding board used  TherEx; bridging x 10 reps; hip and knee flexion/extension x 10 reps each with assistance/resistance as tolerated; Trunk rotation stretch x 2 reps R and L sides 30 sec hold  NeuroRe-ed; sitting balance activity unsupported on side of mat without UE support with CGA; sit to R & L 1/2 side sitting  with return to upright x 3 reps each with min assist           PT Education - 08/23/14 0903    Education provided Yes   Education Details instructed to practice sitting balance at home - on side of bed unsupported   Person(s) Educated Patient;Other (comment)  sister   Methods Explanation;Demonstration   Comprehension Verbalized understanding;Returned demonstration          PT Short Term Goals - 08/01/14 1045    PT SHORT TERM GOAL #1   Title  Pt. will transfer with sliding board wheelchair to mat with +1 max assist.  (target date 08-31-14)   Baseline not met as of 07-31-14; pt. declined to attempt due to decr. time available in appt. due to late arrival and also declined attempting today due to fear - pt. has only been using hoyer lift at home   Time 4   Status On-going   PT SHORT TERM GOAL #2   Title Sit unsupported on side of mat x 5" with +2 mod assist   Status Achieved   PT SHORT TERM GOAL #3   Title Independent to direct HEP (stretches and PROM for bil. LE's)   Status Achieved   PT SHORT TERM GOAL #4   Title Tolerate standing in standing frame x 10" for LE stretching and weight-bearing   (08-31-14)   Baseline Not yet attempted - pt. cont to only use hoyer lift for transfers and has significant fear of falling   Time 4   Period Weeks   Status New   PT SHORT TERM GOAL #5   Title Incr. endurance/activity tolerance so pt. able to perform 10' nonstop on recumbent bike Comptroller)  (08-31-14)   Time 4   Period Weeks   Status New           PT Long Term Goals - 08/01/14 1052    PT LONG TERM GOAL #1   Title Transfer with sliding board with +1 mod assist wheelchair to/from mat   Baseline Pt. requires hoyer lift - unable to use sliding board.     Target date 09-30-14   Time 8   Status On-going   PT LONG TERM GOAL #2   Title Sit on side of mat for at least 10" with UE support prn with SBA to incr. independence with dressing, grooming,etc. at home  (09-30-14)   Time 8   Period Weeks   Status On-going   PT LONG TERM GOAL #3   Title Sit unsupported on side of mat with 1 UE support and reach 4" anteriorly with other UE support with min assist to increase independence with ADL's with mod  (09-30-14)   Time 8   Period Weeks   Status On-going   PT LONG TERM GOAL #4   Title Independent in updated HEP  (09-30-14)   Time 8   Period Weeks   Status New               Plan - 08/23/14 0905    Clinical Impression Statement Pt. performed  most bed mobility training that she has participated in since being in PT - pt. remains very  fearful of falling and c/o tightness in abdomen area; mobility limited by fear; did well with rolling to right and left sides - improved with repetition   Pt will benefit from skilled therapeutic intervention in order to improve on the following deficits Decreased mobility;Obesity;Decreased range of motion;Impaired flexibility;Increased muscle spasms;Decreased balance;Decreased strength   Rehab Potential Good   PT Frequency 2x / week   PT Duration 8 weeks   PT Treatment/Interventions ADLs/Self Care Home Management;Therapeutic activities;Patient/family education;Therapeutic exercise;Wheelchair mobility training;Balance training;Neuromuscular re-education;Functional mobility training   PT Next Visit Plan use sliding board to transfer out of wheelchair; cont ther ex   PT Home Exercise Plan LE ROM and stretching - gave pt red therabnd and instructed to do hip abduction /adduction in w/c in seated position   Consulted and Agree with Plan of Care Patient        Problem List Patient Active Problem List   Diagnosis Date Noted  . Hypertension 05/01/2014  . Ingrown right big toenail 05/01/2014  . Hypokalemia 05/01/2014  . GERD (gastroesophageal reflux disease) 02/14/2014  . Dysphagia, pharyngoesophageal phase 02/06/2014  . Type 2 diabetes mellitus without complication 79/15/0569  . Constipation   . Neurogenic bowel   . 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 Metastatic cancer to spine 01/19/2014  . Rotator cuff tendonitis   . Acute postoperative respiratory failure 01/09/2014  . 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 Leg weakness, bilateral 12/13/2013    VXYIAX, KPVVZ SMOLMBE, PT 08/23/2014, 9:10 AM  [facility] [address] [place], [place], [address] Phone: [phone]   Fax:   [phone]

## 2014-08-24 ENCOUNTER — Ambulatory Visit: Payer: BLUE CROSS/BLUE SHIELD | Attending: Family Medicine | Admitting: Physical Therapy

## 2014-08-24 DIAGNOSIS — R2991 Unspecified symptoms and signs involving the musculoskeletal system: Secondary | ICD-10-CM | POA: Diagnosis present

## 2014-08-24 DIAGNOSIS — G839 Paralytic syndrome, unspecified: Secondary | ICD-10-CM | POA: Diagnosis present

## 2014-08-24 DIAGNOSIS — G8220 Paraplegia, unspecified: Secondary | ICD-10-CM

## 2014-08-24 DIAGNOSIS — M6281 Muscle weakness (generalized): Secondary | ICD-10-CM | POA: Insufficient documentation

## 2014-08-24 DIAGNOSIS — R29818 Other symptoms and signs involving the nervous system: Secondary | ICD-10-CM

## 2014-08-25 ENCOUNTER — Encounter: Payer: Self-pay | Admitting: Physical Therapy

## 2014-08-25 NOTE — Therapy (Signed)
[facility] [address] [place], [place], [address] Phone: [phone]   Fax:  [phone]  Physical Therapy Treatment  Patient Details  Name: Sarah [name] MRN: [id number] Date of Birth: 05/21/1965 Referring Provider:  McKeag, Ian D, MD  Encounter Date: 08/24/2014      PT End of Session - 08/25/14 0903    Visit Number 11   Number of Visits 16   Date for PT Re-Evaluation 08/31/14   Authorization Type BCBS   Authorization Time Period 03-24-14  - 03-24-15   Authorization - Visit Number 6   Authorization - Number of Visits 30   PT Start Time 1018   PT Stop Time 1102   PT Time Calculation (min) 44 min   Equipment Utilized During Treatment Gait belt      Past Medical History  Diagnosis Date  . Hypertension   . DDD (degenerative disc disease), cervical   . DJD (degenerative joint disease)   . Obesity   . Chronic back pain   . 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 History of rectal fissure   . Rotator cuff tendonitis right    Past Surgical History  Procedure Laterality Date  . Tonsillectomy    . Laminectomy N/A 12/14/2013    Procedure: THORACIC LAMINECTOMY FOR TUMOR THORACIC THREE;  Surgeon: [name] Cabbell, MD;  Location: [facility];  Service: Neurosurgery;  Laterality: N/A;  THORACIC LAMINECTOMY FOR TUMOR THORACIC THREE  . Posterior lumbar fusion 4 level N/A 12/30/2013    Procedure: Thoracic one-Thoracic five posterior thoracic fusion with pedicle screws;  Surgeon: [name] Cabbell, MD;  Location: [facility];  Service: Neurosurgery;  Laterality: N/A;  Thoracic one-Thoracic five posterior thoracic fusion with pedicle screws  . Thyroidectomy N/A 01/09/2014    Procedure: TOTAL THYROIDECTOMY;  Surgeon: Jefry Rosen, MD;  Location: [facility];  Service: ENT;  Laterality: N/A;    There were no vitals filed for this visit.  Visit Diagnosis:  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  Muscle weakness  (generalized)  Poor trunk control      Subjective Assessment - 08/25/14 0859    Subjective Pt. states she did exercises on Wed. at home with sister's assistance - was very tired that night   Patient Stated Goals Improve mobility   Currently in Pain? No/denies                         [facility] Adult PT Treatment/Exercise - 08/25/14 0001    Transfers   Transfers Lateral/Scoot Transfers   Lateral/Scoot Transfers 4: Min assist   Lateral/Scoot Transfer Details (indicate cue type and reason) min assist for w/c to mat at end of transfer to transfer off of board onto mat table  some mod assist needed at end of transfer             Balance Exercises - 08/25/14 0902    Balance Exercises: Seated   Static Sitting Eyes opened;Upper extremity support - 2;Lower extremity support - 1  UE support varied   Dynamic Sitting Eyes opened  tossing ball x 10 reps with min to mod assist   Other Seated Exercises transfer sit to R and L 1/2 side sitting with mod assist             PT Short Term Goals - 08/01/14 1045    PT SHORT TERM GOAL #1   Title Pt. will transfer with sliding   board wheelchair to mat with +1 max assist.  (target date 08-31-14)   Baseline not met as of 07-31-14; pt. declined to attempt due to decr. time available in appt. due to late arrival and also declined attempting today due to fear - pt. has only been using hoyer lift at home   Time 4   Status On-going   PT SHORT TERM GOAL #2   Title Sit unsupported on side of mat x 5" with +2 mod assist   Status Achieved   PT SHORT TERM GOAL #3   Title Independent to direct HEP (stretches and PROM for bil. LE's)   Status Achieved   PT SHORT TERM GOAL #4   Title Tolerate standing in standing frame x 10" for LE stretching and weight-bearing   (08-31-14)   Baseline Not yet attempted - pt. cont to only use hoyer lift for transfers and has significant fear of falling   Time 4   Period Weeks   Status New   PT SHORT TERM GOAL  #5   Title Incr. endurance/activity tolerance so pt. able to perform 10' nonstop on recumbent bike (Scifit)  (08-31-14)   Time 4   Period Weeks   Status New           PT Long Term Goals - 08/01/14 1052    PT LONG TERM GOAL #1   Title Transfer with sliding board with +1 mod assist wheelchair to/from mat   Baseline Pt. requires hoyer lift - unable to use sliding board.     Target date 09-30-14   Time 8   Status On-going   PT LONG TERM GOAL #2   Title Sit on side of mat for at least 10" with UE support prn with SBA to incr. independence with dressing, grooming,etc. at home  (09-30-14)   Time 8   Period Weeks   Status On-going   PT LONG TERM GOAL #3   Title Sit unsupported on side of mat with 1 UE support and reach 4" anteriorly with other UE support with min assist to increase independence with ADL's with mod  (09-30-14)   Time 8   Period Weeks   Status On-going   PT LONG TERM GOAL #4   Title Independent in updated HEP  (09-30-14)   Time 8   Period Weeks   Status New               Plan - 08/25/14 0904    Clinical Impression Statement Pt. requested not to lie down and attempt rolling prone during session - pt states she continues to feel tightness in abdomen; pt remains fearful of falling   Pt will benefit from skilled therapeutic intervention in order to improve on the following deficits Decreased mobility;Obesity;Decreased range of motion;Impaired flexibility;Increased muscle spasms;Decreased balance;Decreased strength   Rehab Potential Good   PT Frequency 2x / week   PT Duration 8 weeks   PT Treatment/Interventions ADLs/Self Care Home Management;Therapeutic activities;Patient/family education;Therapeutic exercise;Wheelchair mobility training;Balance training;Neuromuscular re-education;Functional mobility training   PT Next Visit Plan use sliding board to transfer out of wheelchair; cont ther ex   PT Home Exercise Plan LE ROM and stretching - gave pt red therabnd and instructed  to do hip abduction /adduction in w/c in seated position   Consulted and Agree with Plan of Care Patient        Problem List Patient Active Problem List   Diagnosis Date Noted  . Hypertension 05/01/2014  . Ingrown right big toenail 05/01/2014  .   Hypokalemia 05/01/2014  . GERD (gastroesophageal reflux disease) 02/14/2014  . Dysphagia, pharyngoesophageal phase 02/06/2014  . Type 2 diabetes mellitus without complication 01/23/2014  . Constipation   . Neurogenic bowel   . 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 Metastatic cancer to spine 01/19/2014  . Rotator cuff tendonitis   . Acute postoperative respiratory failure 01/09/2014  . 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 Leg weakness, bilateral 12/13/2013    Dilday, Linda Suzanne, PT 08/25/2014, 9:09 AM  [facility] [address] [place], [place], [address] Phone: [phone]   Fax:  [phone]      

## 2014-08-28 ENCOUNTER — Encounter: Payer: Self-pay | Admitting: Physical Therapy

## 2014-08-28 ENCOUNTER — Ambulatory Visit: Payer: BLUE CROSS/BLUE SHIELD | Admitting: Physical Therapy

## 2014-08-28 DIAGNOSIS — G8220 Paraplegia, unspecified: Secondary | ICD-10-CM

## 2014-08-28 DIAGNOSIS — G839 Paralytic syndrome, unspecified: Secondary | ICD-10-CM | POA: Diagnosis not present

## 2014-08-28 DIAGNOSIS — M6281 Muscle weakness (generalized): Secondary | ICD-10-CM

## 2014-08-28 NOTE — Therapy (Signed)
[facility] [address] [place], [place], [address] Phone: [phone]   Fax:  [phone]  Physical Therapy Treatment  Patient Details  Name: Sarah [name] MRN: [id number] Date of Birth: 12/06/1965 Referring Provider:  Elberta Leatherwood, MD  Encounter Date: 08/28/2014      PT End of Session - 08/28/14 2238    Visit Number 12   Number of Visits 16   Date for PT Re-Evaluation 08/31/14   Authorization Type BCBS   Authorization Time Period 03-24-14  - 03-24-15   Authorization - Visit Number 7   Authorization - Number of Visits 30   PT Start Time 6060   PT Stop Time 1152   PT Time Calculation (min) 49 min   Equipment Utilized During Treatment Gait belt      Past Medical History  Diagnosis Date  . Hypertension   . DDD (degenerative disc disease), cervical   . DJD (degenerative joint disease)   . Obesity   . Chronic back pain   . 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 History of rectal fissure   . Rotator cuff tendonitis right    Past Surgical History  Procedure Laterality Date  . Tonsillectomy    . Laminectomy N/A 12/14/2013    Procedure: THORACIC LAMINECTOMY FOR TUMOR THORACIC THREE;  Surgeon: Ashok Pall, MD;  Location: [facility];  Service: Neurosurgery;  Laterality: N/A;  THORACIC LAMINECTOMY FOR TUMOR THORACIC THREE  . Posterior lumbar fusion 4 level N/A 12/30/2013    Procedure: Thoracic one-Thoracic five posterior thoracic fusion with pedicle screws;  Surgeon: Ashok Pall, MD;  Location: [facility];  Service: Neurosurgery;  Laterality: N/A;  Thoracic one-Thoracic five posterior thoracic fusion with pedicle screws  . Thyroidectomy N/A 01/09/2014    Procedure: TOTAL THYROIDECTOMY;  Surgeon: Izora Gala, MD;  Location: [facility];  Service: ENT;  Laterality: N/A;    There were no vitals filed for this visit.  Visit Diagnosis:  Muscle weakness (generalized)  Paraplegia at  T4 level      Subjective Assessment - 08/28/14 2235    Subjective Pt. states she went shooping on Saturday; also sat up in a manual wheelchair for about an hour over the weekend   [name] accompained by: Family member   Patient Stated Goals Improve mobility   Currently in Pain? No/denies                         [facility] Adult PT Treatment/Exercise - 08/28/14 0001    Bed Mobility   Bed Mobility Rolling Right;Rolling Left;Supine to Sit;Sit to Supine   Rolling Right 4: Min assist   Rolling Right Details (indicate cue type and reason) assistance to position LLE in flexion   Supine to Sit 3: Mod assist;2: Max assist   Sit to Supine 3: Mod assist  to transfer LE's onto mat   Transfers   Transfers Lateral/Scoot Transfers   Lateral/Scoot Transfers 4: Min IT sales professional --  sliding board     Pt. Rolled prone with mod assist +2 - max assist to position RUE to prop on forearms; pt able to hold position approx. 10" with performing intermittent cervical extension and scapular retraction while propped on forearms Sitting balance activity - sit to R and L 1/2 side sitting for lateral trunk stretching  PT Short Term Goals - 08/01/14 1045    PT SHORT TERM GOAL #1   Title Pt. will transfer with sliding board wheelchair to mat with +1 max assist.  (target date 08-31-14)   Baseline not met as of 07-31-14; pt. declined to attempt due to decr. time available in appt. due to late arrival and also declined attempting today due to fear - pt. has only been using hoyer lift at home   Time 4   Status On-going   PT SHORT TERM GOAL #2   Title Sit unsupported on side of mat x 5" with +2 mod assist   Status Achieved   PT SHORT TERM GOAL #3   Title Independent to direct HEP (stretches and PROM for bil. LE's)   Status Achieved   PT SHORT TERM GOAL #4   Title Tolerate standing in standing frame x 10" for LE stretching and weight-bearing   (08-31-14)    Baseline Not yet attempted - pt. cont to only use hoyer lift for transfers and has significant fear of falling   Time 4   Period Weeks   Status New   PT SHORT TERM GOAL #5   Title Incr. endurance/activity tolerance so pt. able to perform 10' nonstop on recumbent bike Comptroller)  (08-31-14)   Time 4   Period Weeks   Status New           PT Long Term Goals - 08/01/14 1052    PT LONG TERM GOAL #1   Title Transfer with sliding board with +1 mod assist wheelchair to/from mat   Baseline Pt. requires hoyer lift - unable to use sliding board.     Target date 09-30-14   Time 8   Status On-going   PT LONG TERM GOAL #2   Title Sit on side of mat for at least 10" with UE support prn with SBA to incr. independence with dressing, grooming,etc. at home  (09-30-14)   Time 8   Period Weeks   Status On-going   PT LONG TERM GOAL #3   Title Sit unsupported on side of mat with 1 UE support and reach 4" anteriorly with other UE support with min assist to increase independence with ADL's with mod  (09-30-14)   Time 8   Period Weeks   Status On-going   PT LONG TERM GOAL #4   Title Independent in updated HEP  (09-30-14)   Time 8   Period Weeks   Status New               Plan - 08/28/14 2239    Clinical Impression Statement Pt. rolled prone for first time today - assistance of 3 people required but pt tolerated approx 10 mins propped on forearms   Pt will benefit from skilled therapeutic intervention in order to improve on the following deficits Decreased mobility;Obesity;Decreased range of motion;Impaired flexibility;Increased muscle spasms;Decreased balance;Decreased strength   Rehab Potential Good   PT Frequency 2x / week   PT Duration 8 weeks   PT Treatment/Interventions ADLs/Self Care Home Management;Therapeutic activities;Patient/family education;Therapeutic exercise;Wheelchair mobility training;Balance training;Neuromuscular re-education;Functional mobility training   PT Next Visit Plan cont  bed mobility and sitting balance   PT Home Exercise Plan LE ROM and stretching - gave pt red therabnd and instructed to do hip abduction /adduction in w/c in seated position   Consulted and Agree with Plan of Care Patient        Problem List Patient Active Problem List   Diagnosis  Date Noted  . Hypertension 05/01/2014  . Ingrown right big toenail 05/01/2014  . Hypokalemia 05/01/2014  . GERD (gastroesophageal reflux disease) 02/14/2014  . Dysphagia, pharyngoesophageal phase 02/06/2014  . Type 2 diabetes mellitus without complication 58/68/2574  . Constipation   . Neurogenic bowel   . 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 Metastatic cancer to spine 01/19/2014  . Rotator cuff tendonitis   . Acute postoperative respiratory failure 01/09/2014  . 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 Leg weakness, bilateral 12/13/2013    VTXLEZ, VGJFT NBZXYDS, PT 08/28/2014, 10:42 PM  [facility] [address] [place], [place], [address] Phone: [phone]   Fax:  [phone]

## 2014-08-29 ENCOUNTER — Telehealth: Payer: Self-pay | Admitting: *Deleted

## 2014-08-29 ENCOUNTER — Ambulatory Visit: Payer: BLUE CROSS/BLUE SHIELD | Admitting: Physical Therapy

## 2014-08-29 DIAGNOSIS — R29818 Other symptoms and signs involving the nervous system: Secondary | ICD-10-CM

## 2014-08-29 DIAGNOSIS — R2991 Unspecified symptoms and signs involving the musculoskeletal system: Secondary | ICD-10-CM

## 2014-08-29 DIAGNOSIS — G839 Paralytic syndrome, unspecified: Secondary | ICD-10-CM | POA: Diagnosis not present

## 2014-08-29 DIAGNOSIS — G8220 Paraplegia, unspecified: Secondary | ICD-10-CM

## 2014-08-29 DIAGNOSIS — M6281 Muscle weakness (generalized): Secondary | ICD-10-CM

## 2014-08-29 NOTE — Telephone Encounter (Signed)
Sarah [name] called on pt's behalf asking for the results of her recent swallow study.

## 2014-08-30 ENCOUNTER — Encounter: Payer: Self-pay | Admitting: Physical Therapy

## 2014-08-30 NOTE — Therapy (Signed)
[facility] [address] [place], [place], [address] Phone: [phone]   Fax:  [phone]  Physical Therapy Treatment  Patient Details  Name: Sarah [name] MRN: [id number] Date of Birth: 05-27-1965 Referring Provider:  Elberta Leatherwood, MD  Encounter Date: 08/29/2014      PT End of Session - 08/30/14 1754    Visit Number 13   Number of Visits 16   Date for PT Re-Evaluation 08/31/14   Authorization Type BCBS   Authorization Time Period 03-24-14  - 03-24-15   Authorization - Visit Number 8   Authorization - Number of Visits 30   PT Start Time 8616   PT Stop Time 1145   PT Time Calculation (min) 40 min   Equipment Utilized During Treatment Gait belt      Past Medical History  Diagnosis Date  . Hypertension   . DDD (degenerative disc disease), cervical   . DJD (degenerative joint disease)   . Obesity   . Chronic back pain   . 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 History of rectal fissure   . Rotator cuff tendonitis right    Past Surgical History  Procedure Laterality Date  . Tonsillectomy    . Laminectomy N/A 12/14/2013    Procedure: THORACIC LAMINECTOMY FOR TUMOR THORACIC THREE;  Surgeon: Ashok Pall, MD;  Location: [facility];  Service: Neurosurgery;  Laterality: N/A;  THORACIC LAMINECTOMY FOR TUMOR THORACIC THREE  . Posterior lumbar fusion 4 level N/A 12/30/2013    Procedure: Thoracic one-Thoracic five posterior thoracic fusion with pedicle screws;  Surgeon: Ashok Pall, MD;  Location: [facility];  Service: Neurosurgery;  Laterality: N/A;  Thoracic one-Thoracic five posterior thoracic fusion with pedicle screws  . Thyroidectomy N/A 01/09/2014    Procedure: TOTAL THYROIDECTOMY;  Surgeon: Izora Gala, MD;  Location: [facility];  Service: ENT;  Laterality: N/A;    There were no vitals filed for this visit.  Visit Diagnosis:  Muscle weakness (generalized)  Paraplegia at  T4 level  Poor trunk control      Subjective Assessment - 08/30/14 1751    Subjective Pt. reports having tightness in abdomen area today - requests to not to lie down on mat today but will work in sitting position   Patient Stated Goals Improve mobility   Currently in Pain? No/denies                         [facility] Adult PT Treatment/Exercise - 08/30/14 0001    Transfers   Transfers Lateral/Scoot Transfers   Lateral/Scoot Transfers 4: Min assist   Lateral/Scoot Transfer Details (indicate cue type and reason) mod to max assist for positioning feet during transfer   Transfer via Lift Equipment --  sliding board      TherEx:  Trunk ext./flexion with use of bil. UE's on dowel rod - 2 sets of 10 reps Trunk stretching in sitting position with trunk rotation x 10 sec hold to R and L sides x 2 reps   stretching back back lean forward in unsupported sitting on side of mat  Neuro Re-ed; sitting balance activity with UE support prn with CGA; sit to 1/2 side sitting x 3 reps to right and left  Sides; practiced weight shifting and scooting each hip back and then forward on mat x 3 reps each side With min to mod assist  PT Short Term Goals - 08/01/14 1045    PT SHORT TERM GOAL #1   Title Pt. will transfer with sliding board wheelchair to mat with +1 max assist.  (target date 08-31-14)   Baseline not met as of 07-31-14; pt. declined to attempt due to decr. time available in appt. due to late arrival and also declined attempting today due to fear - pt. has only been using hoyer lift at home   Time 4   Status On-going   PT SHORT TERM GOAL #2   Title Sit unsupported on side of mat x 5" with +2 mod assist   Status Achieved   PT SHORT TERM GOAL #3   Title Independent to direct HEP (stretches and PROM for bil. LE's)   Status Achieved   PT SHORT TERM GOAL #4   Title Tolerate standing in standing frame x 10" for LE stretching and weight-bearing   (08-31-14)   Baseline  Not yet attempted - pt. cont to only use hoyer lift for transfers and has significant fear of falling   Time 4   Period Weeks   Status New   PT SHORT TERM GOAL #5   Title Incr. endurance/activity tolerance so pt. able to perform 10' nonstop on recumbent bike Comptroller)  (08-31-14)   Time 4   Period Weeks   Status New           PT Long Term Goals - 08/01/14 1052    PT LONG TERM GOAL #1   Title Transfer with sliding board with +1 mod assist wheelchair to/from mat   Baseline Pt. requires hoyer lift - unable to use sliding board.     Target date 09-30-14   Time 8   Status On-going   PT LONG TERM GOAL #2   Title Sit on side of mat for at least 10" with UE support prn with SBA to incr. independence with dressing, grooming,etc. at home  (09-30-14)   Time 8   Period Weeks   Status On-going   PT LONG TERM GOAL #3   Title Sit unsupported on side of mat with 1 UE support and reach 4" anteriorly with other UE support with min assist to increase independence with ADL's with mod  (09-30-14)   Time 8   Period Weeks   Status On-going   PT LONG TERM GOAL #4   Title Independent in updated HEP  (09-30-14)   Time 8   Period Weeks   Status New               Plan - 08/30/14 1755    Clinical Impression Statement Pt. improving with functional mobiltiy - has difficulty performing sliding board transfer when pants get stuck under board; weight shifts improving   Pt will benefit from skilled therapeutic intervention in order to improve on the following deficits Decreased mobility;Obesity;Decreased range of motion;Impaired flexibility;Increased muscle spasms;Decreased balance;Decreased strength   Rehab Potential Good   PT Frequency 2x / week   PT Duration 8 weeks   PT Treatment/Interventions ADLs/Self Care Home Management;Therapeutic activities;Patient/family education;Therapeutic exercise;Wheelchair mobility training;Balance training;Neuromuscular re-education;Functional mobility training   PT Next  Visit Plan cont bed mobility and sitting balance   PT Home Exercise Plan LE ROM and stretching - gave pt red therabnd and instructed to do hip abduction /adduction in w/c in seated position   Consulted and Agree with Plan of Care Patient        Problem List Patient Active Problem List   Diagnosis Date  Noted  . Hypertension 05/01/2014  . Ingrown right big toenail 05/01/2014  . Hypokalemia 05/01/2014  . GERD (gastroesophageal reflux disease) 02/14/2014  . Dysphagia, pharyngoesophageal phase 02/06/2014  . Type 2 diabetes mellitus without complication 91/63/8466  . Constipation   . Neurogenic bowel   . 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 Metastatic cancer to spine 01/19/2014  . Rotator cuff tendonitis   . Acute postoperative respiratory failure 01/09/2014  . 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 Leg weakness, bilateral 12/13/2013    ZLDJTT, SVXBL [id number], PT 08/30/2014, 6:01 PM  [facility] [address] [place], [place], [address] Phone: [phone]   Fax:  [phone]

## 2014-09-04 ENCOUNTER — Encounter: Payer: Self-pay | Admitting: Family Medicine

## 2014-09-04 ENCOUNTER — Ambulatory Visit (INDEPENDENT_AMBULATORY_CARE_PROVIDER_SITE_OTHER): Payer: BLUE CROSS/BLUE SHIELD | Admitting: Family Medicine

## 2014-09-04 VITALS — BP 122/85 | HR 81 | Temp 98.2°F

## 2014-09-04 DIAGNOSIS — G839 Paralytic syndrome, unspecified: Secondary | ICD-10-CM | POA: Diagnosis not present

## 2014-09-04 DIAGNOSIS — R300 Dysuria: Secondary | ICD-10-CM | POA: Diagnosis not present

## 2014-09-04 DIAGNOSIS — G8220 Paraplegia, unspecified: Secondary | ICD-10-CM

## 2014-09-04 NOTE — Assessment & Plan Note (Addendum)
Patient at increased risk for UTI due to being nonambulatory. She has been free of UTIs since she was able to DC her catheter a while back. However patient stated that giving a urine sample would be easier if done at home due to her wheelchair status. Future UA order placed and collection cup w/ wipes provided. Patient asked to bring sample back tomorrow and soon after filling.    Future: if patient continues to have these issues, consider giving standing script for Macrobid

## 2014-09-04 NOTE — Patient Instructions (Addendum)
It was a pleasure seeing you today in our clinic. Today we discussed your insurance coverage and some pain with urination. Here is the treatment plan we have discussed and agreed upon together:  - I have ordered for a Urinalysis to be performed. Please collect this sample at home and bring it in to the clinic soon after. - I am upset about the issues you've been going through with your Medicaid and wish there was more I could do. I will send a message to have our Social Worker contact you sometime in the near future.

## 2014-09-04 NOTE — Progress Notes (Signed)
   HPI  CC: dysuria  Patient presents w/ dysuria. Just started a couple days ago. No hematuria or four odor. No hesitancy or frequency. Denies fevers chills, n/v/d, flank pain or changes in appetite. Has not taken anything for this yet.   We also discussed her recent loss of Medicaid. Resources and supplies have been incredibly difficult to obtain since this was discontinued. She says she no longer qualifies for medicaid due to the disability she receives. She would like to discuss this w/ a CSW.   Review of Systems   See HPI for ROS. All other systems reviewed and are negative.  Past medical history and social history reviewed and updated in the EMR as appropriate.  Objective: BP 122/85 mmHg  Pulse 81  Temp(Src) 98.2 F (36.8 C)  LMP 07/26/2014 Gen: NAD, alert, cooperative with exam HEENT: NCAT, EOMI, PERRL, surgical scar across anterior neck CV: RRR, no murmur Resp: CTABL, no wheezes, non-labored Abd: Soft, mild epigastric tenderness, ND, BS present, no guarding or organomegaly Ext: No edema, warm, pulses present equally bilaterally Neuro: Alert and oriented, 2/5 strength bilateral lower extremities with hip abduction/adduction, 2/5 strength bilateral plantar/dorsiflexion. 2/5 strength extensor hallucis longus bilateral. Reflexes not assessed  Assessment and plan:  Dysuria Patient at increased risk for UTI due to being nonambulatory. She has been free of UTIs since she was able to DC her catheter a while back. However patient stated that giving a urine sample would be easier if done at home due to her wheelchair status. Future UA order placed and collection cup w/ wipes provided. Patient asked to bring sample back tomorrow and soon after filling.    Future: if patient continues to have these issues, consider giving standing script for Macrobid  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 Patient doing well. PT helping immensely. Movement and sensation continues to improve. Recent completion of  Medicaid has been very tough for patient. I will forward her name to our CSW who may be able to help this patient w/ some of her issues.     Orders Placed This Encounter  Procedures  . POCT urinalysis dipstick    Standing Status: Future     Number of Occurrences:      Standing Expiration Date: 01/04/202016    Elberta Leatherwood, MD,MS,  PGY1 09/04/2014 5:53 PM

## 2014-09-04 NOTE — Assessment & Plan Note (Signed)
Patient doing well. PT helping immensely. Movement and sensation continues to improve. Recent completion of Medicaid has been very tough for patient. I will forward her name to our CSW who may be able to help this patient w/ some of her issues.

## 2014-09-05 ENCOUNTER — Telehealth: Payer: Self-pay | Admitting: Clinical

## 2014-09-05 ENCOUNTER — Ambulatory Visit: Payer: BLUE CROSS/BLUE SHIELD | Admitting: Physical Therapy

## 2014-09-05 DIAGNOSIS — G839 Paralytic syndrome, unspecified: Secondary | ICD-10-CM | POA: Diagnosis not present

## 2014-09-05 DIAGNOSIS — R29818 Other symptoms and signs involving the nervous system: Secondary | ICD-10-CM

## 2014-09-05 DIAGNOSIS — M6281 Muscle weakness (generalized): Secondary | ICD-10-CM

## 2014-09-05 DIAGNOSIS — R2991 Unspecified symptoms and signs involving the musculoskeletal system: Secondary | ICD-10-CM

## 2014-09-05 DIAGNOSIS — G8220 Paraplegia, unspecified: Secondary | ICD-10-CM

## 2014-09-05 NOTE — Telephone Encounter (Signed)
-----   Message from Elberta Leatherwood, MD sent at 09/04/2014  5:57 PM EDT ----- Regarding: Medicaid expired Hi Sarah [name],  Patient asked if you could call her, so I was wondering if you could take a look at this her case. She's basically paralyzed from the waist down (well, she should be but she didn't get the memo and is miraculously regaining some slight muscle movement). She is wheelchair bound and just needs some help.  Her Medicaid ran out last month but according to her it is b/c she's getting too much via her disability. Any loopholes or tricks?  Thanks!! [name]

## 2014-09-05 NOTE — Telephone Encounter (Signed)
CSW LVM for pt regarding losing her Medicaid. CSW will encourage pt to speak with an attorney as they would be familiar with qualifying for Medicaid.  Hunt Oris, MSW, [place]

## 2014-09-06 ENCOUNTER — Encounter: Payer: Self-pay | Admitting: Physical Therapy

## 2014-09-06 NOTE — Therapy (Signed)
[facility] [address] [place], [place], [address] Phone: [phone]   Fax:  [phone]  Physical Therapy Treatment  Patient Details  Name: Sarah [name] MRN: [id number] Date of Birth: 05-23-1965 Referring Provider:  Elberta Leatherwood, MD  Encounter Date: 09/05/2014      PT End of Session - 09/06/14 1049    Visit Number 14   Number of Visits 16   Date for PT Re-Evaluation 08/31/14   Authorization Type BC[employer]   Authorization Time Period 03-24-14  - 03-24-15   Authorization - Visit Number 9   Authorization - Number of Visits 30   PT Start Time 1106   PT Stop Time 1148   PT Time Calculation (min) 42 min   Equipment Utilized During Treatment Gait belt      Past Medical History  Diagnosis Date  . Hypertension   . DDD (degenerative disc disease), cervical   . DJD (degenerative joint disease)   . Obesity   . Chronic back pain   . 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 History of rectal fissure   . Rotator cuff tendonitis right    Past Surgical History  Procedure Laterality Date  . Tonsillectomy    . Laminectomy N/A 12/14/2013    Procedure: THORACIC LAMINECTOMY FOR TUMOR THORACIC THREE;  Surgeon: Ashok Pall, MD;  Location: [facility];  Service: Neurosurgery;  Laterality: N/A;  THORACIC LAMINECTOMY FOR TUMOR THORACIC THREE  . Posterior lumbar fusion 4 level N/A 12/30/2013    Procedure: Thoracic one-Thoracic five posterior thoracic fusion with pedicle screws;  Surgeon: Ashok Pall, MD;  Location: [facility];  Service: Neurosurgery;  Laterality: N/A;  Thoracic one-Thoracic five posterior thoracic fusion with pedicle screws  . Thyroidectomy N/A 01/09/2014    Procedure: TOTAL THYROIDECTOMY;  Surgeon: Izora Gala, MD;  Location: [facility];  Service: ENT;  Laterality: N/A;    There were no vitals filed for this visit.  Visit Diagnosis:  Muscle weakness (generalized)  Poor trunk  control  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      Subjective Assessment - 09/06/14 1047    Subjective Pt reports no changes and no problems - '"everything's fine"   Patient Stated Goals Improve mobility   Currently in Pain? No/denies                         [facility] Adult PT Treatment/Exercise - 09/06/14 0001    Transfers   Transfers Lateral/Scoot Transfers   Lateral/Scoot Transfers 4: Min assist   Lateral/Scoot Transfer Details (indicate cue type and reason) min assist to initiate sccot and mod assist to complete transfer onto mat     Neuro Re-ed: sitting balance activities on side of mat - tossing ball up x 10 reps; making circles clockwise & counterclockwise X 5 reps each; sit to 1/2 sidesitting on R and L forearm x 3 reps each side; leaning back on wedge and pulling forward With use of dowel rod x 10 reps for trunk control and strengthening  TherAct; pt transferred back into wheelchair with sliding board with min assist initially and mod assist to complete Transfer into chair             PT Short Term Goals - 08/01/14 1045    PT SHORT TERM GOAL #1   Title Pt. will transfer with sliding board wheelchair to mat with +1 max assist.  (target date 08-31-14)  Baseline not met as of 07-31-14; pt. declined to attempt due to decr. time available in appt. due to late arrival and also declined attempting today due to fear - pt. has only been using hoyer lift at home   Time 4   Status On-going   PT SHORT TERM GOAL #2   Title Sit unsupported on side of mat x 5" with +2 mod assist   Status Achieved   PT SHORT TERM GOAL #3   Title Independent to direct HEP (stretches and PROM for bil. LE's)   Status Achieved   PT SHORT TERM GOAL #4   Title Tolerate standing in standing frame x 10" for LE stretching and weight-bearing   (08-31-14)   Baseline Not yet attempted - pt. cont to only use hoyer lift for transfers and has significant fear of falling   Time 4   Period Weeks   Status New    PT SHORT TERM GOAL #5   Title Incr. endurance/activity tolerance so pt. able to perform 10' nonstop on recumbent bike Comptroller)  (08-31-14)   Time 4   Period Weeks   Status New           PT Long Term Goals - 08/01/14 1052    PT LONG TERM GOAL #1   Title Transfer with sliding board with +1 mod assist wheelchair to/from mat   Baseline Pt. requires hoyer lift - unable to use sliding board.     Target date 09-30-14   Time 8   Status On-going   PT LONG TERM GOAL #2   Title Sit on side of mat for at least 10" with UE support prn with SBA to incr. independence with dressing, grooming,etc. at home  (09-30-14)   Time 8   Period Weeks   Status On-going   PT LONG TERM GOAL #3   Title Sit unsupported on side of mat with 1 UE support and reach 4" anteriorly with other UE support with min assist to increase independence with ADL's with mod  (09-30-14)   Time 8   Period Weeks   Status On-going   PT LONG TERM GOAL #4   Title Independent in updated HEP  (09-30-14)   Time 8   Period Weeks   Status New               Plan - 09/06/14 1050    Clinical Impression Statement Pt. imrpoving with performing slliding board transfers; cont to c/o tightness in abdomen and trunk - due to spasticity? has difficulty with sit to 1/2 sidesitting on R side   Pt will benefit from skilled therapeutic intervention in order to improve on the following deficits Decreased mobility;Obesity;Decreased range of motion;Impaired flexibility;Increased muscle spasms;Decreased balance;Decreased strength   Rehab Potential Good   PT Frequency 2x / week   PT Duration 8 weeks   PT Treatment/Interventions ADLs/Self Care Home Management;Therapeutic activities;Patient/family education;Therapeutic exercise;Wheelchair mobility training;Balance training;Neuromuscular re-education;Functional mobility training   PT Next Visit Plan cont bed mobility and sitting balance   PT Home Exercise Plan LE ROM and stretching - gave pt red therabnd and  instructed to do hip abduction /adduction in w/c in seated position   Consulted and Agree with Plan of Care Patient        Problem List Patient Active Problem List   Diagnosis Date Noted  . Dysuria 09/04/2014  . Hypertension 05/01/2014  . Ingrown right big toenail 05/01/2014  . Hypokalemia 05/01/2014  . GERD (gastroesophageal reflux disease) 02/14/2014  .  Dysphagia, pharyngoesophageal phase 02/06/2014  . Type 2 diabetes mellitus without complication 85/50/1586  . Constipation   . Neurogenic bowel   . 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 Metastatic cancer to spine 01/19/2014  . Rotator cuff tendonitis   . Acute postoperative respiratory failure 01/09/2014  . 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 Leg weakness, bilateral 12/13/2013    Alda Lea, PT 09/06/2014, 10:52 AM  [facility] [address] [place], [place], [address] Phone: [phone]   Fax:  [phone]

## 2014-09-07 ENCOUNTER — Ambulatory Visit: Payer: BLUE CROSS/BLUE SHIELD | Admitting: Physical Therapy

## 2014-09-07 DIAGNOSIS — G839 Paralytic syndrome, unspecified: Secondary | ICD-10-CM | POA: Diagnosis not present

## 2014-09-07 DIAGNOSIS — R2991 Unspecified symptoms and signs involving the musculoskeletal system: Secondary | ICD-10-CM

## 2014-09-07 DIAGNOSIS — R29818 Other symptoms and signs involving the nervous system: Secondary | ICD-10-CM

## 2014-09-07 DIAGNOSIS — M6281 Muscle weakness (generalized): Secondary | ICD-10-CM

## 2014-09-07 DIAGNOSIS — G8220 Paraplegia, unspecified: Secondary | ICD-10-CM

## 2014-09-08 ENCOUNTER — Encounter: Payer: Self-pay | Admitting: Physical Therapy

## 2014-09-08 NOTE — Therapy (Signed)
[facility] [address] [place], [place], [address] Phone: [phone]   Fax:  [phone]  Physical Therapy Treatment  Patient Details  Name: Sarah [name] MRN: [id number] Date of Birth: 04/06/1965 Referring Provider:  McKeag, Ian D, MD  Encounter Date: 09/07/2014      PT End of Session - 09/08/14 1820    Visit Number 15   Number of Visits 16   Date for PT Re-Evaluation 08/31/14   Authorization Type BCBS   Authorization Time Period 03-24-14  - 03-24-15   Authorization - Visit Number 10   Authorization - Number of Visits 30   PT Start Time 1108   PT Stop Time 1150   PT Time Calculation (min) 42 min   Equipment Utilized During Treatment Gait belt      Past Medical History  Diagnosis Date  . Hypertension   . DDD (degenerative disc disease), cervical   . DJD (degenerative joint disease)   . Obesity   . Chronic back pain   . 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 History of rectal fissure   . Rotator cuff tendonitis right    Past Surgical History  Procedure Laterality Date  . Tonsillectomy    . Laminectomy N/A 12/14/2013    Procedure: THORACIC LAMINECTOMY FOR TUMOR THORACIC THREE;  Surgeon: [name] Cabbell, MD;  Location: [facility];  Service: Neurosurgery;  Laterality: N/A;  THORACIC LAMINECTOMY FOR TUMOR THORACIC THREE  . Posterior lumbar fusion 4 level N/A 12/30/2013    Procedure: Thoracic one-Thoracic five posterior thoracic fusion with pedicle screws;  Surgeon: [name] Cabbell, MD;  Location: [facility];  Service: Neurosurgery;  Laterality: N/A;  Thoracic one-Thoracic five posterior thoracic fusion with pedicle screws  . Thyroidectomy N/A 01/09/2014    Procedure: TOTAL THYROIDECTOMY;  Surgeon: Jefry Rosen, MD;  Location: [facility];  Service: ENT;  Laterality: N/A;    There were no vitals filed for this visit.  Visit Diagnosis:  Muscle weakness (generalized)  Paraplegia  at T4 level  Poor trunk control      Subjective Assessment - 09/08/14 1818    Subjective Pt. reports wheelchair part has come in - is supposed to be fixed soon; reports no changes since last visit   Patient Stated Goals Improve mobility   Currently in Pain? No/denies                         [facility] Adult PT Treatment/Exercise - 09/08/14 0001    Transfers   Transfers Lateral/Scoot Transfers   Lateral/Scoot Transfers 4: Min assist   Transfer via Lift Equipment --  sliding board     NeuroRe-ed; unsupported sitting balance activity on side of mat - lifting alternate UE's in flexion and abduction with CGA To min assist; sit to 1/2 sidesitting on R and L x 3 reps each side with CGA; trunk rotation in sitting x 3 reps to each side Leaning back with UE support on PT's hands for limits of stability in seated position - with mod assist Scapular squeezes x 20 reps in seated position for postural retraining            PT Short Term Goals - 08/01/14 1045    PT SHORT TERM GOAL #1   Title Pt. will transfer with sliding board wheelchair to mat with +1 max assist.  (target date 08-31-14)   Baseline not met as of 07-31-14;   pt. declined to attempt due to decr. time available in appt. due to late arrival and also declined attempting today due to fear - pt. has only been using hoyer lift at home   Time 4   Status On-going   PT SHORT TERM GOAL #2   Title Sit unsupported on side of mat x 5" with +2 mod assist   Status Achieved   PT SHORT TERM GOAL #3   Title Independent to direct HEP (stretches and PROM for bil. LE's)   Status Achieved   PT SHORT TERM GOAL #4   Title Tolerate standing in standing frame x 10" for LE stretching and weight-bearing   (08-31-14)   Baseline Not yet attempted - pt. cont to only use hoyer lift for transfers and has significant fear of falling   Time 4   Period Weeks   Status New   PT SHORT TERM GOAL #5   Title Incr. endurance/activity tolerance so pt.  able to perform 10' nonstop on recumbent bike Comptroller)  (08-31-14)   Time 4   Period Weeks   Status New           PT Long Term Goals - 08/01/14 1052    PT LONG TERM GOAL #1   Title Transfer with sliding board with +1 mod assist wheelchair to/from mat   Baseline Pt. requires hoyer lift - unable to use sliding board.     Target date 09-30-14   Time 8   Status On-going   PT LONG TERM GOAL #2   Title Sit on side of mat for at least 10" with UE support prn with SBA to incr. independence with dressing, grooming,etc. at home  (09-30-14)   Time 8   Period Weeks   Status On-going   PT LONG TERM GOAL #3   Title Sit unsupported on side of mat with 1 UE support and reach 4" anteriorly with other UE support with min assist to increase independence with ADL's with mod  (09-30-14)   Time 8   Period Weeks   Status On-going   PT LONG TERM GOAL #4   Title Independent in updated HEP  (09-30-14)   Time [place]   Status New               Plan - 09/08/14 1824    Pt will benefit from skilled therapeutic intervention in order to improve on the following deficits Decreased mobility;Obesity;Decreased range of motion;Impaired flexibility;Increased muscle spasms;Decreased balance;Decreased strength   Rehab Potential Good   PT Frequency 2x / week   PT Duration 8 weeks   PT Treatment/Interventions ADLs/Self Care Home Management;Therapeutic activities;Patient/family education;Therapeutic exercise;Wheelchair mobility training;Balance training;Neuromuscular re-education;Functional mobility training   PT Next Visit Plan cont bed mobility and sitting balance; check STG's   PT Home Exercise Plan LE ROM and stretching - gave pt red therabnd and instructed to do hip abduction /adduction in w/c in seated position   Consulted and Agree with Plan of Care Patient        Problem List Patient Active Problem List   Diagnosis Date Noted  . Dysuria 09/04/2014  . Hypertension 05/01/2014  . Ingrown right  big toenail 05/01/2014  . Hypokalemia 05/01/2014  . GERD (gastroesophageal reflux disease) 02/14/2014  . Dysphagia, pharyngoesophageal phase 02/06/2014  . Type 2 diabetes mellitus without complication 79/04/4095  . Constipation   . Neurogenic bowel   . 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 Metastatic cancer to spine 01/19/2014  .  Rotator cuff tendonitis   . Acute postoperative respiratory failure 01/09/2014  . 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 Leg weakness, bilateral 12/13/2013    Dilday, Linda Suzanne, PT 09/08/2014, 6:25 PM  [facility] [address] [place], [place], [address] Phone: [phone]   Fax:  [phone]      

## 2014-09-10 ENCOUNTER — Other Ambulatory Visit: Payer: Self-pay | Admitting: Oncology

## 2014-09-18 ENCOUNTER — Other Ambulatory Visit: Payer: Self-pay | Admitting: Physical Medicine & Rehabilitation

## 2014-09-18 ENCOUNTER — Ambulatory Visit: Payer: BLUE CROSS/BLUE SHIELD | Admitting: Physical Therapy

## 2014-09-18 ENCOUNTER — Encounter: Payer: Self-pay | Admitting: Physical Therapy

## 2014-09-18 DIAGNOSIS — G8220 Paraplegia, unspecified: Secondary | ICD-10-CM

## 2014-09-18 DIAGNOSIS — G839 Paralytic syndrome, unspecified: Secondary | ICD-10-CM | POA: Diagnosis not present

## 2014-09-18 DIAGNOSIS — R29818 Other symptoms and signs involving the nervous system: Secondary | ICD-10-CM

## 2014-09-18 DIAGNOSIS — R2991 Unspecified symptoms and signs involving the musculoskeletal system: Secondary | ICD-10-CM

## 2014-09-18 DIAGNOSIS — M6281 Muscle weakness (generalized): Secondary | ICD-10-CM

## 2014-09-18 NOTE — Therapy (Signed)
[facility] [address] [place], [place], [address] Phone: [phone]   Fax:  [phone]  Physical Therapy Treatment  Patient Details  Name: Sarah [name] MRN: [id number] Date of Birth: 02-Jul-1965 Referring Provider:  Elberta Leatherwood, MD  Encounter Date: 09/18/2014      PT End of Session - 09/18/14 1828    Visit Number 16   Number of Visits 19   Date for PT Re-Evaluation 09/30/14   Authorization Type BCBS   Authorization Time Period 03-24-14  - 03-24-15   Authorization - Visit Number 11   Authorization - Number of Visits 30      Past Medical History  Diagnosis Date  . Hypertension   . DDD (degenerative disc disease), cervical   . DJD (degenerative joint disease)   . Obesity   . Chronic back pain   . 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 History of rectal fissure   . Rotator cuff tendonitis right    Past Surgical History  Procedure Laterality Date  . Tonsillectomy    . Laminectomy N/A 12/14/2013    Procedure: THORACIC LAMINECTOMY FOR TUMOR THORACIC THREE;  Surgeon: Ashok Pall, MD;  Location: [facility];  Service: Neurosurgery;  Laterality: N/A;  THORACIC LAMINECTOMY FOR TUMOR THORACIC THREE  . Posterior lumbar fusion 4 level N/A 12/30/2013    Procedure: Thoracic one-Thoracic five posterior thoracic fusion with pedicle screws;  Surgeon: Ashok Pall, MD;  Location: [facility];  Service: Neurosurgery;  Laterality: N/A;  Thoracic one-Thoracic five posterior thoracic fusion with pedicle screws  . Thyroidectomy N/A 01/09/2014    Procedure: TOTAL THYROIDECTOMY;  Surgeon: Izora Gala, MD;  Location: [facility];  Service: ENT;  Laterality: N/A;    There were no vitals filed for this visit.  Visit Diagnosis:  Muscle weakness (generalized)  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  Poor trunk control      Subjective Assessment - 09/18/14 1815    Subjective Pt. reports she obtained a  sliding board - she found the prescription from Dr. Naaman Plummer and they ordered one - has used it a couple of times at home   Patient Stated Goals Improve mobility   Currently in Pain? No/denies                         [facility] Adult PT Treatment/Exercise - 09/18/14 0001    Bed Mobility   Bed Mobility Rolling Right;Rolling Left;Supine to Sit   Rolling Right 4: Min assist   Rolling Left 4: Min assist   Supine to Sit 3: Mod assist   Sit to Supine 4: Min assist  to transfer LE's onto mat   Transfers   Transfers Lateral/Scoot Transfers   Lateral/Scoot Transfers 4: Min assist  CGA for mat to wheelchair    Lateral/Scoot Transfer Details (indicate cue type and reason) needs max assist to position board and to position feet during transfer   Transfer via Lift Equipment --  sliding board      NeuroRe-ed; sitting balance activity - tossing ball up in center, to right side and to left side for trunk rotation with min assist;  sitting to 1/2 side sitting 5 reps to right and left side; rolling blue swiss ball forward and back for anterior/posterior trunk Limits of stability  TherAct; rolling supine to right and left with assist. To position LE in flexion; sit to supine with mod assist to  transfer LE's Onto mat; bridging x 10 reps ; practiced weight shifting right and left to assist with scooting hips forward and back          PT Short Term Goals - 09/18/14 1835    PT SHORT TERM GOAL #1   Title Pt. will transfer with sliding board wheelchair to mat with +1 max assist.  (target date 08-31-14)   Baseline met 09-18-14   Time 4   Period Weeks   Status Achieved   PT SHORT TERM GOAL #2   Title Sit unsupported on side of mat x 5" with +2 mod assist   Baseline met   Time 4   Period Weeks   Status Achieved   PT SHORT TERM GOAL #3   Title Independent to direct HEP (stretches and PROM for bil. LE's)   Baseline met 09-18-14   Status Achieved   PT SHORT TERM GOAL #4   Title Tolerate  standing in standing frame x 10" for LE stretching and weight-bearing   (08-31-14)   Status Achieved   PT SHORT TERM GOAL #5   Title Incr. endurance/activity tolerance so pt. able to perform 10' nonstop on recumbent bike (Scifit)  (08-31-14)   Status On-going           PT Long Term Goals - 08/01/14 1052    PT LONG TERM GOAL #1   Title Transfer with sliding board with +1 mod assist wheelchair to/from mat   Baseline Pt. requires hoyer lift - unable to use sliding board.     Target date 09-30-14   Time 8   Status On-going   PT LONG TERM GOAL #2   Title Sit on side of mat for at least 10" with UE support prn with SBA to incr. independence with dressing, grooming,etc. at home  (09-30-14)   Time 8   Period Weeks   Status On-going   PT LONG TERM GOAL #3   Title Sit unsupported on side of mat with 1 UE support and reach 4" anteriorly with other UE support with min assist to increase independence with ADL's with mod  (09-30-14)   Time 8   Period Weeks   Status On-going   PT LONG TERM GOAL #4   Title Independent in updated HEP  (09-30-14)   Time 8   Period Weeks   Status New               Plan - 09/18/14 1832    Clinical Impression Statement Pt. has met all STG's - pt is progressing well; continues to improve with transfers, but reports continued tightness in trunk musculature   Pt will benefit from skilled therapeutic intervention in order to improve on the following deficits Decreased mobility;Obesity;Decreased range of motion;Impaired flexibility;Increased muscle spasms;Decreased balance;Decreased strength   Rehab Potential Good   PT Frequency 2x / week   PT Duration 8 weeks   PT Treatment/Interventions ADLs/Self Care Home Management;Therapeutic activities;Patient/family education;Therapeutic exercise;Wheelchair mobility training;Balance training;Neuromuscular re-education;Functional mobility training   PT Next Visit Plan cont transfer training and sitting balance   PT Home Exercise  Plan LE ROM and stretching - gave pt red therabnd and instructed to do hip abduction /adduction in w/c in seated position   Consulted and Agree with Plan of Care Patient        Problem List Patient Active Problem List   Diagnosis Date Noted  . Dysuria 09/04/2014  . Hypertension 05/01/2014  . Ingrown right big toenail 05/01/2014  . Hypokalemia 05/01/2014  .  GERD (gastroesophageal reflux disease) 02/14/2014  . Dysphagia, pharyngoesophageal phase 02/06/2014  . Type 2 diabetes mellitus without complication 94/44/6190  . Constipation   . Neurogenic bowel   . 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 Metastatic cancer to spine 01/19/2014  . Rotator cuff tendonitis   . Acute postoperative respiratory failure 01/09/2014  . 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 Leg weakness, bilateral 12/13/2013    VQQUIV, HOYWV XUCJARW, PT 09/18/2014, 6:38 PM  [facility] [address] [place], [place], [address] Phone: [phone]   Fax:  [phone]

## 2014-09-19 ENCOUNTER — Encounter: Payer: Self-pay | Admitting: Physical Medicine & Rehabilitation

## 2014-09-19 ENCOUNTER — Encounter
Payer: BLUE CROSS/BLUE SHIELD | Attending: Physical Medicine & Rehabilitation | Admitting: Physical Medicine & Rehabilitation

## 2014-09-19 DIAGNOSIS — G8389 Other specified paralytic syndromes: Secondary | ICD-10-CM

## 2014-09-19 DIAGNOSIS — G839 Paralytic syndrome, unspecified: Secondary | ICD-10-CM | POA: Diagnosis present

## 2014-09-19 DIAGNOSIS — R2991 Unspecified symptoms and signs involving the musculoskeletal system: Secondary | ICD-10-CM | POA: Diagnosis not present

## 2014-09-19 DIAGNOSIS — K592 Neurogenic bowel, not elsewhere classified: Secondary | ICD-10-CM

## 2014-09-19 DIAGNOSIS — IMO0002 Reserved for concepts with insufficient information to code with codable children: Secondary | ICD-10-CM

## 2014-09-19 DIAGNOSIS — M6281 Muscle weakness (generalized): Secondary | ICD-10-CM | POA: Insufficient documentation

## 2014-09-19 DIAGNOSIS — R1314 Dysphagia, pharyngoesophageal phase: Secondary | ICD-10-CM

## 2014-09-19 DIAGNOSIS — G8220 Paraplegia, unspecified: Secondary | ICD-10-CM

## 2014-09-19 DIAGNOSIS — R252 Cramp and spasm: Secondary | ICD-10-CM | POA: Insufficient documentation

## 2014-09-19 MED ORDER — RIVAROXABAN 20 MG PO TABS: 20.0000 mg | ORAL_TABLET | Freq: Every day | ORAL | Status: DC

## 2014-09-19 MED ORDER — BACLOFEN 20 MG PO TABS: 20.0000 mg | ORAL_TABLET | Freq: Four times a day (QID) | ORAL | Status: DC

## 2014-09-19 NOTE — Patient Instructions (Signed)
PLEASE CALL ME WITH ANY PROBLEMS OR QUESTIONS (#[phone]).  HAVE A GOOD DAY!    

## 2014-09-19 NOTE — Progress Notes (Signed)
Subjective:    Patient ID: Sarah [name], female    DOB: [birth year], 49 y.o.   MRN: [id number]  HPI   Sarah [name] is here in follow up of her SCI. Sarah [name] is currently learning to use slide board to get herself in and out of bed/chair at therapy.  She was given an Rx for a commode chair but needs new Rx for one with drop arms faxed to [place] in [place]   She is seeing a lot more movement in her hips and proximal legs.   Bladder and bowel have continued to do well. She is continent of bowel and bladder.   From a standpoint of spasms, she feels that they are worse. She is using baclofen '20mg'$  TID currently---she never increased to qid.    Pain Inventory Average Pain 0 Pain Right Now 0 My pain is burning and tingling  In the last 24 hours, has pain interfered with the following? General activity 0 Relation with others 0 Enjoyment of life 0 What TIME of day is your pain at its worst? night Sleep (in general) Fair  Pain is worse with: inactivity Pain improves with: therapy/exercise Relief from Meds: 5  Mobility ability to climb steps?  no do you drive?  no use a wheelchair needs help with transfers  Function disabled: date disabled . I need assistance with the following:  dressing, bathing, toileting and meal prep  Neuro/Psych numbness tingling spasms depression  Prior Studies Any changes since last visit?  no  Physicians involved in your care Any changes since last visit?  no   Family History  Problem Relation Age of Onset  . Hypertension Mother   . Diabetes Mother   . Hypertension Father   . Stroke Father    History   Social History  . Marital Status: Single    Spouse Name: N/A  . Number of Children: N/A  . Years of Education: N/A   Social History Main Topics  . Smoking status: Never Smoker   . Smokeless tobacco: Never Used  . Alcohol Use: No  . Drug Use: No  . Sexual Activity: Not Currently   Other Topics Concern  . None    Social History Narrative   Past Surgical History  Procedure Laterality Date  . Tonsillectomy    . Laminectomy N/A 12/14/2013    Procedure: THORACIC LAMINECTOMY FOR TUMOR THORACIC THREE;  Surgeon: Ashok Pall, MD;  Location: [facility];  Service: Neurosurgery;  Laterality: N/A;  THORACIC LAMINECTOMY FOR TUMOR THORACIC THREE  . Posterior lumbar fusion 4 level N/A 12/30/2013    Procedure: Thoracic one-Thoracic five posterior thoracic fusion with pedicle screws;  Surgeon: Ashok Pall, MD;  Location: [facility];  Service: Neurosurgery;  Laterality: N/A;  Thoracic one-Thoracic five posterior thoracic fusion with pedicle screws  . Thyroidectomy N/A 01/09/2014    Procedure: TOTAL THYROIDECTOMY;  Surgeon: Izora Gala, MD;  [facility];  Service: ENT;  Laterality: N/A;   Past Medical History  Diagnosis Date  . Hypertension   . DDD (degenerative disc disease), cervical   . DJD (degenerative joint disease)   . Obesity   . Chronic back pain   . 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 History of rectal fissure   . Rotator cuff tendonitis right   There were no vitals taken for this visit.  Opioid Risk Score:   Fall Risk Score: Low Fall Risk (0-5 points)`1  Depression screen  PHQ 2/9  Depression screen [facility] 2/9 09/19/2014 04/20/2014 03/03/2014  Decreased Interest 0 0 0  Down, Depressed, Hopeless 1 0 1  PHQ - 2 Score 1 0 1  Altered sleeping 0 - -  Tired, decreased energy 1 - -  Change in appetite 0 - -  Feeling bad or failure about yourself  0 - -  Trouble concentrating 0 - -  Moving slowly or fidgety/restless 0 - -  Suicidal thoughts 0 - -  PHQ-9 Score 2 - -     Review of Systems  Cardiovascular: Positive for leg swelling.  Musculoskeletal:       Spasms  Neurological: Positive for numbness.       Tingling  Psychiatric/Behavioral: Positive for dysphoric mood.  All other systems reviewed and are negative.      Objective:   Physical  Exam  Constitutional: She is oriented to person, place, and time. She appears well-developed and well-nourished.  HENT:  Head: Normocephalic and atraumatic.  Neck: Normal range of motion.  Cardiovascular: Normal rate and regular rhythm.  No murmur heard.  Respiratory: Effort normal and breath sounds normal. No respiratory distress. She has no wheezes. She exhibits no tenderness.  GI: Soft. Bowel sounds are normal. She exhibits no distension. There is no tenderness.  Musculoskeletal: She exhibits no edema and no tenderness.  Neurological: She is alert and oriented to person, place, and time. Diminished sensation 1/2 below lower/mid chest wall. Able to sense pain  Motor strength is 5/5 bilateral deltoids, biceps, triceps, grip  1+ to 2/5 bilateral hip flexors, hip add/hip abd 1+ to 2/5, 2- knee extensors, 2/5 knee flexion, 1+/5 ankle dorsiflexor and plantar flexors  Tone 1+ to 2/4 left hip adductors, 1/4 gastrocs/hamstrings. T3-4 sensory level Skin: toe nail stable  Psych: pleasant, in good spirits    Assessment/Plan:  1. 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  -pt displaying ongoing motorical gains which are now translating to functional gains!   2. Pain Management/spasticity:   -tylenol prn  -increase baclofen to '20mg'$  qid TODAY.  Will titrate further as possible.  4. Neurogenic bladder: timed voids q4-5 x per day to avoid excessive bladder volume and pressure  5. Neurogenic bowel/GI:  -suppository if needed  6. Continue with neuro-rehab: continue as she's making functional gains. 8. Dysphagia: ?radiation esophagitis: worsening sx  -small bites, substantial chewing with meals   Follow up with me in 3 months. Thirty minutes of face to face patient care time were spent during this visit. All questions were encouraged and answered.

## 2014-09-20 ENCOUNTER — Ambulatory Visit: Payer: BLUE CROSS/BLUE SHIELD | Admitting: Physical Therapy

## 2014-09-21 ENCOUNTER — Ambulatory Visit: Payer: BLUE CROSS/BLUE SHIELD | Admitting: Physical Therapy

## 2014-09-21 DIAGNOSIS — G8220 Paraplegia, unspecified: Secondary | ICD-10-CM

## 2014-09-21 DIAGNOSIS — M6281 Muscle weakness (generalized): Secondary | ICD-10-CM

## 2014-09-21 DIAGNOSIS — R29818 Other symptoms and signs involving the nervous system: Secondary | ICD-10-CM

## 2014-09-21 DIAGNOSIS — G839 Paralytic syndrome, unspecified: Secondary | ICD-10-CM | POA: Diagnosis not present

## 2014-09-21 DIAGNOSIS — R2991 Unspecified symptoms and signs involving the musculoskeletal system: Secondary | ICD-10-CM

## 2014-09-22 ENCOUNTER — Encounter: Payer: Self-pay | Admitting: Physical Therapy

## 2014-09-22 NOTE — Therapy (Signed)
[facility] [address] [place], [place], [address] Phone: [phone]   Fax:  [phone]  Physical Therapy Treatment  Patient Details  Name: Sarah [name] MRN: [id number] Date of Birth: 1966/01/13 Referring Provider:  Elberta Leatherwood, MD  Encounter Date: 09/21/2014      PT End of Session - 09/22/14 0905    Visit Number 17   Number of Visits 19   Date for PT Re-Evaluation 09/30/14   Authorization Type [employer]   Authorization Time Period 03-24-14  - 03-24-15   Authorization - Visit Number 12   Authorization - Number of Visits 30   PT Start Time 1020   PT Stop Time 1110   PT Time Calculation (min) 50 min   Equipment Utilized During Treatment Gait belt      Past Medical History  Diagnosis Date  . Hypertension   . DDD (degenerative disc disease), cervical   . DJD (degenerative joint disease)   . Obesity   . Chronic back pain   . 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 History of rectal fissure   . Rotator cuff tendonitis right    Past Surgical History  Procedure Laterality Date  . Tonsillectomy    . Laminectomy N/A 12/14/2013    Procedure: THORACIC LAMINECTOMY FOR TUMOR THORACIC THREE;  Surgeon: Ashok Pall, MD;  Location: [facility];  Service: Neurosurgery;  Laterality: N/A;  THORACIC LAMINECTOMY FOR TUMOR THORACIC THREE  . Posterior lumbar fusion 4 level N/A 12/30/2013    Procedure: Thoracic one-Thoracic five posterior thoracic fusion with pedicle screws;  Surgeon: Ashok Pall, MD;  Location: [facility];  Service: Neurosurgery;  Laterality: N/A;  Thoracic one-Thoracic five posterior thoracic fusion with pedicle screws  . Thyroidectomy N/A 01/09/2014    Procedure: TOTAL THYROIDECTOMY;  Surgeon: Izora Gala, MD;  Location: [facility];  Service: ENT;  Laterality: N/A;    There were no vitals filed for this visit.  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 reports she has been doing exercises at home - has used sliding board a couple of times but not much   Patient Stated Goals Improve mobility   Currently in Pain? No/denies                         [facility] Adult PT Treatment/Exercise - 09/22/14 0001    Bed Mobility   Bed Mobility Rolling Right;Rolling Left;Supine to Sit   Rolling Right 4: Min assist   Rolling Left 4: Min assist   Supine to Sit 3: Mod assist   Sit to Supine 4: Min assist  to transfer LE's onto mat   Transfers   Transfers Lateral/Scoot Transfers   Lateral/Scoot Transfers 4: Min guard   Lateral/Scoot Transfer Details (indicate cue type and reason) needs most assist. at end of transfer onto mat   Knee/Hip Exercises: Aerobic   Stationary Bike SciFit level 1.5 x 6" with UE and LE's   Knee/Hip Exercises: Supine   Bridges AROM;10 reps    Neuro Re-ed: Pt. Performed zoom ball on side of mat in unsupported sitting with min assist - occasional mod assist for recovery of LOB Sitting balance with trunk rotation on side of mat with min assist Practiced rolling supine to right and left sides 5  reps each with min assist for positioning LE Practiced scooting back - right and left hips with mod assist TherEx:  Trunk ext/flexion back onto wedge with 2 pillows - pt holding onto therapist's arms to assist with pulling up to  Sitting; sit to R and L sidelying with oppositte arm overhead for stretching - 30 sec hold             PT Short Term Goals - 09/18/14 1835    PT SHORT TERM GOAL #1   Title Pt. will transfer with sliding board wheelchair to mat with +1 max assist.  (target date 08-31-14)   Baseline met 09-18-14   Time 4   Period Weeks   Status Achieved   PT SHORT TERM GOAL #2   Title Sit unsupported on side of mat x 5" with +2 mod assist   Baseline met   Time 4   Period Weeks   Status Achieved   PT SHORT TERM  GOAL #3   Title Independent to direct HEP (stretches and PROM for bil. LE's)   Baseline met 09-18-14   Status Achieved   PT SHORT TERM GOAL #4   Title Tolerate standing in standing frame x 10" for LE stretching and weight-bearing   (08-31-14)   Status Achieved   PT SHORT TERM GOAL #5   Title Incr. endurance/activity tolerance so pt. able to perform 10' nonstop on recumbent bike (Scifit)  (08-31-14)   Status On-going           PT Long Term Goals - 08/01/14 1052    PT LONG TERM GOAL #1   Title Transfer with sliding board with +1 mod assist wheelchair to/from mat   Baseline Pt. requires hoyer lift - unable to use sliding board.     Target date 09-30-14   Time 8   Status On-going   PT LONG TERM GOAL #2   Title Sit on side of mat for at least 10" with UE support prn with SBA to incr. independence with dressing, grooming,etc. at home  (09-30-14)   Time 8   Period Weeks   Status On-going   PT LONG TERM GOAL #3   Title Sit unsupported on side of mat with 1 UE support and reach 4" anteriorly with other UE support with min assist to increase independence with ADL's with mod  (09-30-14)   Time 8   Period Weeks   Status On-going   PT LONG TERM GOAL #4   Title Independent in updated HEP  (09-30-14)   Time 8   Period Weeks   Status New               Plan - 09/22/14 0906    Clinical Impression Statement Pt. improving with transfers - performed best sliding board transfer to/from mat with less assist. required to date; pt. demonstrating less fear with moevement in unsupported sitting   Pt will benefit from skilled therapeutic intervention in order to improve on the following deficits Decreased mobility;Obesity;Decreased range of motion;Impaired flexibility;Increased muscle spasms;Decreased balance;Decreased strength   Rehab Potential Good   PT Frequency 2x / week   PT Duration 8 weeks   PT Treatment/Interventions ADLs/Self Care Home Management;Therapeutic activities;Patient/family  education;Therapeutic exercise;Wheelchair mobility training;Balance training;Neuromuscular re-education;Functional mobility training   PT Next Visit Plan cont transfer training and sitting balance   PT Home Exercise Plan LE ROM and stretching - gave pt red therabnd and instructed to do hip abduction /adduction in w/c in seated position   Consulted  and Agree with Plan of Care Patient        Problem List Patient Active Problem List   Diagnosis Date Noted  . 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 Dysuria 09/04/2014  . Hypertension 05/01/2014  . Ingrown right big toenail 05/01/2014  . Hypokalemia 05/01/2014  . GERD (gastroesophageal reflux disease) 02/14/2014  . Dysphagia, pharyngoesophageal phase 02/06/2014  . Type 2 diabetes mellitus without complication 45/36/4680  . Constipation   . Neurogenic bowel   . 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 Metastatic cancer to spine 01/19/2014  . Rotator cuff tendonitis   . Acute postoperative respiratory failure 01/09/2014  . 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 Leg weakness, bilateral 12/13/2013    HOZYYQ, MGNOI BBCWUGQ, PT 09/22/2014, 9:10 AM  [facility] [address] [place], [place], [address] Phone: [phone]   Fax:  [phone]

## 2014-10-02 ENCOUNTER — Ambulatory Visit: Payer: BLUE CROSS/BLUE SHIELD | Attending: Family Medicine | Admitting: Physical Therapy

## 2014-10-02 DIAGNOSIS — G8220 Paraplegia, unspecified: Secondary | ICD-10-CM | POA: Insufficient documentation

## 2014-10-02 DIAGNOSIS — Z7409 Other reduced mobility: Secondary | ICD-10-CM | POA: Insufficient documentation

## 2014-10-02 DIAGNOSIS — M25619 Stiffness of unspecified shoulder, not elsewhere classified: Secondary | ICD-10-CM | POA: Insufficient documentation

## 2014-10-02 DIAGNOSIS — M6281 Muscle weakness (generalized): Secondary | ICD-10-CM

## 2014-10-02 DIAGNOSIS — G839 Paralytic syndrome, unspecified: Secondary | ICD-10-CM | POA: Insufficient documentation

## 2014-10-02 DIAGNOSIS — R2991 Unspecified symptoms and signs involving the musculoskeletal system: Secondary | ICD-10-CM | POA: Insufficient documentation

## 2014-10-02 NOTE — Therapy (Signed)
[facility] [address] [place], [place], [address] Phone: [phone]   Fax:  [phone]  Patient Details  Name: Sarah [name] MRN: [id number] Date of Birth: December 05, 1965 Referring Provider:  Elberta Leatherwood, MD  Encounter Date: 10/02/2014 Pt arrived  for PT 25" late - appt cancelled due to limited number of visits authorized with insurance; pt. requested to reschedule appt. In order to maximize visits and time in PT   Ferrin Liebig Suzanne, [place] 10/02/2014, 1:00 PM  [facility] [address] [place], [place], [address] Phone: [phone]   Fax:  [phone]

## 2014-10-04 ENCOUNTER — Telehealth: Payer: Self-pay | Admitting: Physical Medicine & Rehabilitation

## 2014-10-04 ENCOUNTER — Ambulatory Visit: Payer: BLUE CROSS/BLUE SHIELD | Admitting: Physical Therapy

## 2014-10-04 DIAGNOSIS — G8220 Paraplegia, unspecified: Secondary | ICD-10-CM

## 2014-10-04 DIAGNOSIS — G839 Paralytic syndrome, unspecified: Secondary | ICD-10-CM | POA: Diagnosis not present

## 2014-10-04 DIAGNOSIS — R2991 Unspecified symptoms and signs involving the musculoskeletal system: Secondary | ICD-10-CM

## 2014-10-04 DIAGNOSIS — R29818 Other symptoms and signs involving the nervous system: Secondary | ICD-10-CM

## 2014-10-04 DIAGNOSIS — M25619 Stiffness of unspecified shoulder, not elsewhere classified: Secondary | ICD-10-CM | POA: Diagnosis present

## 2014-10-04 DIAGNOSIS — M6281 Muscle weakness (generalized): Secondary | ICD-10-CM | POA: Diagnosis present

## 2014-10-04 DIAGNOSIS — Z7409 Other reduced mobility: Secondary | ICD-10-CM | POA: Diagnosis present

## 2014-10-04 DIAGNOSIS — IMO0002 Reserved for concepts with insufficient information to code with codable children: Secondary | ICD-10-CM

## 2014-10-04 NOTE — Telephone Encounter (Signed)
Therapy order written

## 2014-10-05 ENCOUNTER — Ambulatory Visit: Payer: BLUE CROSS/BLUE SHIELD

## 2014-10-05 ENCOUNTER — Encounter: Payer: Self-pay | Admitting: Physical Therapy

## 2014-10-05 NOTE — Therapy (Signed)
[facility] [address] [place], [place], [address] Phone: [phone]   Fax:  [phone]  Physical Therapy Treatment  Patient Details  Name: Sarah [name] MRN: [id number] Date of Birth: 1966-02-25 Referring Provider:  Elberta Leatherwood, MD  Encounter Date: 10/04/2014      PT End of Session - 10/05/14 0555    Visit Number 18   Number of Visits 19   Date for PT Re-Evaluation 11/04/14   Authorization Type BC[employer]   Authorization Time Period 03-24-14  - 03-24-15   Authorization - Visit Number 13   Authorization - Number of Visits 30   PT Start Time 4742   PT Stop Time 0846   PT Time Calculation (min) 47 min      Past Medical History  Diagnosis Date  . Hypertension   . DDD (degenerative disc disease), cervical   . DJD (degenerative joint disease)   . Obesity   . Chronic back pain   . 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 History of rectal fissure   . Rotator cuff tendonitis right    Past Surgical History  Procedure Laterality Date  . Tonsillectomy    . Laminectomy N/A 12/14/2013    Procedure: THORACIC LAMINECTOMY FOR TUMOR THORACIC THREE;  Surgeon: Ashok Pall, MD;  Location: [facility];  Service: Neurosurgery;  Laterality: N/A;  THORACIC LAMINECTOMY FOR TUMOR THORACIC THREE  . Posterior lumbar fusion 4 level N/A 12/30/2013    Procedure: Thoracic one-Thoracic five posterior thoracic fusion with pedicle screws;  Surgeon: Ashok Pall, MD;  Location: [facility];  Service: Neurosurgery;  Laterality: N/A;  Thoracic one-Thoracic five posterior thoracic fusion with pedicle screws  . Thyroidectomy N/A 01/09/2014    Procedure: TOTAL THYROIDECTOMY;  Surgeon: Izora Gala, MD;  Location: [facility];  Service: ENT;  Laterality: N/A;    There were no vitals filed for this visit.  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 states she is no longer using hoyer lift at home to transfer - using sliding board now   Patient Stated Goals Improve mobility   Currently in Pain? No/denies                         [facility] Adult PT Treatment/Exercise - 10/05/14 0001    Bed Mobility   Bed Mobility Rolling Right;Rolling Left;Supine to Sit   Rolling Right 4: Min assist   Rolling Right Details (indicate cue type and reason) assistance to position LLE in flexion and to scoot over to R side   Rolling Left 4: Min assist   Supine to Sit 3: Mod assist   Supine to Sit Details (indicate cue type and reason) has difficulty pushing up onto R arm/shoulder due to previous injury   Sit to Supine 4: Min assist  to transfer LE's onto mat   Sit to Supine - Details (indicate cue type and reason) needs mod to max assist to transfer LE's onto mat   Transfers   Transfers Lateral/Scoot Transfers   Lateral/Scoot Transfers 4: Min guard   Lateral/Scoot Transfer Details (indicate cue type and reason) needs mod assist to transfer off board and  onto mat for final 10% of transfer - pt becomes fearful of falling of edge of mat at this point   Knee/Hip Exercises: Supine   Heel Slides AAROM;10 reps;Both  10 sec hold at chest for stretching   Bridges AAROM;10 reps   Other Supine Knee/Hip Exercises trunk rotation stretch - both sides 30 sec hold   Shoulder Exercises: Therapy Ball   Flexion 5 reps  while sitting without UE support on edge of mat    Pt. Rolled from supine to prone with mod assist for RUE placement due to discomfort in R shoulder; performed bil.  Quad stretching in this position; propped on forearms for thoracic and hip flexor stretching   Transferred mat to wheelchair with board with CGA with max assist for board placement        Balance Exercises -  10/05/14 0552    Balance Exercises: Seated   Dynamic Sitting Eyes opened;Reaching outside base of support  5" anteriorly   Other Seated Exercises transfer sit to R and L 1/2 side sitting with min assist    above exercise (reaching)  performed sitting on edge of mat with CGA without UE support       PT Education - 10/05/14 0553    Education provided Yes   Education Details recommend to pt that she sit unsupported at home ( in recliner or on EOB) to work on trunk control and sitting balance; and also recommended to attempt rolling prone   Person(s) Educated Patient   Methods Explanation;Demonstration   Comprehension Verbalized understanding;Returned demonstration          PT Short Term Goals - 10/05/14 0557    PT SHORT TERM GOAL #1   Title Pt. will transfer with sliding board wheelchair to mat with +1 max assist.  (target date 08-31-14)   Status Achieved   PT SHORT TERM GOAL #2   Title Sit unsupported on side of mat x 5" with +2 mod assist   Status Achieved   PT SHORT TERM GOAL #3   Title Independent to direct HEP (stretches and PROM for bil. LE's)   Status Achieved   PT SHORT TERM GOAL #4   Title Tolerate standing in standing frame x 10" for LE stretching and weight-bearing   (08-31-14)   Status Achieved   PT SHORT TERM GOAL #5   Title Incr. endurance/activity tolerance so pt. able to perform 10' nonstop on recumbent bike (Scifit)  (08-31-14)   Status On-going           PT Long Term Goals - 10/05/14 0612    PT LONG TERM GOAL #1   Title Transfer with sliding board with +1 mod assist wheelchair to/from mat   Status Achieved   PT LONG TERM GOAL #2   Title Sit on side of mat for at least 10" with UE support prn with SBA to incr. independence with dressing, grooming,etc. at home  (09-30-14)   Status Achieved   PT LONG TERM GOAL #3   Title Sit unsupported on side of mat with 1 UE support and reach 4" anteriorly with other UE support with min assist to increase independence with  ADL's with mod  (09-30-14)   Status Achieved   PT LONG TERM GOAL #4   Title Independent in updated HEP  (09-30-14)   Status Achieved   PT LONG TERM GOAL #5   Title Pt. will transfer wheelchair to/from mat with sliding board with S only after set up for placement of board (  11-04-14)   Baseline min assist needed for final part of transfer onto mat   Time 4   Period Weeks   Status New   PT LONG TERM GOAL #6   Title Pt. will roll from supine to R and L modified independently on mat and will report ability to do so at home  (11-04-14)   Baseline min assist needed   Time 4   Period Weeks   Status New               Plan - 10/05/14 0605    Clinical Impression Statement Pt. has met LTG's #1-4; continues to be fearful of falling which limits mobility but is progressing well with increasing mobility   Pt will benefit from skilled therapeutic intervention in order to improve on the following deficits Decreased mobility;Obesity;Decreased range of motion;Impaired flexibility;Increased muscle spasms;Decreased balance;Decreased strength   Rehab Potential Good   PT Frequency 2x / week   PT Duration 4 weeks   PT Treatment/Interventions ADLs/Self Care Home Management;Therapeutic activities;Patient/family education;Therapeutic exercise;Wheelchair mobility training;Balance training;Neuromuscular re-education;Functional mobility training   PT Next Visit Plan cont transfer training with board; sitting balance, LE stretching and strengthening   PT Home Exercise Plan LE ROM and stretching - gave pt red therabnd and instructed to do hip abduction /adduction in w/c in seated position   Consulted and Agree with Plan of Care Patient        Problem List Patient Active Problem List   Diagnosis Date Noted  . 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 Dysuria 09/04/2014  . Hypertension 05/01/2014  . Ingrown right big toenail 05/01/2014  . Hypokalemia 05/01/2014  . GERD (gastroesophageal reflux disease) 02/14/2014   . Dysphagia, pharyngoesophageal phase 02/06/2014  . Type 2 diabetes mellitus without complication 02/12/6243  . Constipation   . Neurogenic bowel   . 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 Metastatic cancer to spine 01/19/2014  . Rotator cuff tendonitis   . Acute postoperative respiratory failure 01/09/2014  . 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 Leg weakness, bilateral 12/13/2013    Alda Lea, PT 10/05/2014, 6:17 AM  [facility] [address] [place], [place], [address] Phone: [phone]   Fax:  [phone]

## 2014-10-10 ENCOUNTER — Ambulatory Visit: Payer: BLUE CROSS/BLUE SHIELD

## 2014-10-10 DIAGNOSIS — M6281 Muscle weakness (generalized): Secondary | ICD-10-CM

## 2014-10-10 DIAGNOSIS — G8220 Paraplegia, unspecified: Secondary | ICD-10-CM

## 2014-10-10 DIAGNOSIS — G839 Paralytic syndrome, unspecified: Secondary | ICD-10-CM | POA: Diagnosis not present

## 2014-10-10 DIAGNOSIS — R2991 Unspecified symptoms and signs involving the musculoskeletal system: Secondary | ICD-10-CM

## 2014-10-10 DIAGNOSIS — R29818 Other symptoms and signs involving the nervous system: Secondary | ICD-10-CM

## 2014-10-10 NOTE — Therapy (Signed)
[facility] [address] [place], [place], [address] Phone: [phone]   Fax:  [phone]  Physical Therapy Treatment  Patient Details  Name: Sarah [name] MRN: [id number] Date of Birth: [birth year] Referring Provider:  Elberta Leatherwood, MD  Encounter Date: 10/10/2014      PT End of Session - 10/10/14 1349    Visit Number 19   Number of Visits 23  Pt's primary PT requested additional visits   Date for PT Re-Evaluation 11/04/14   Authorization Type BC[employer]   Authorization Time Period 03-24-14  - 03-24-15   Authorization - Visit Number 14   Authorization - Number of Visits 30   PT Start Time 1022  pt arrived late   PT Stop Time 1100   PT Time Calculation (min) 38 min   Equipment Utilized During Treatment Gait belt   Activity Tolerance Patient tolerated treatment well   Behavior During Therapy WFL for tasks assessed/performed      Past Medical History  Diagnosis Date  . Hypertension   . DDD (degenerative disc disease), cervical   . DJD (degenerative joint disease)   . Obesity   . Chronic back pain   . 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 History of rectal fissure   . Rotator cuff tendonitis right    Past Surgical History  Procedure Laterality Date  . Tonsillectomy    . Laminectomy N/A 12/14/2013    Procedure: THORACIC LAMINECTOMY FOR TUMOR THORACIC THREE;  Surgeon: Ashok Pall, MD;  Location: [facility];  Service: Neurosurgery;  Laterality: N/A;  THORACIC LAMINECTOMY FOR TUMOR THORACIC THREE  . Posterior lumbar fusion 4 level N/A 12/30/2013    Procedure: Thoracic one-Thoracic five posterior thoracic fusion with pedicle screws;  Surgeon: Ashok Pall, MD;  Location: [facility];  Service: Neurosurgery;  Laterality: N/A;  Thoracic one-Thoracic five posterior thoracic fusion with pedicle screws  . Thyroidectomy N/A 01/09/2014    Procedure: TOTAL THYROIDECTOMY;  Surgeon:  Izora Gala, MD;  Location: [facility];  Service: ENT;  Laterality: N/A;    There were no vitals filed for this visit.  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    [name] accompained by: (p) Family member  [name]   Patient Stated Goals (p) Improve mobility   Currently in Pain? (p) Yes   Pain Score (p) 2    Pain Location (p) Leg   Pain Orientation (p) Right   Pain Descriptors / Indicators (p) Aching;Tingling   Pain Type (p) Chronic pain   Pain Onset (p) More than a month ago   Pain Frequency (p) Constant   Pain Relieving Factors (p) Sometime taking Tylenol helps                         [facility] Adult PT Treatment/Exercise - 10/10/14 1344    Transfers   Transfers Lateral/Scoot Transfers  slide board   Lateral/Scoot Transfers 4: Min assist;With slide board;4: Min guard   Lateral/Scoot Transfer Details (indicate cue type and reason) Pt performed w/c<>mat slide board transfers with min A to guide trunk. Cues to use head/hips relationship to improve weight shifting and to unweight buttocks.    Balance   Balance Assessed Yes   Static  Sitting Balance   Static Sitting - Balance Support No upper extremity supported   Static Sitting - Level of Assistance 5: Stand by assistance   Static Sitting - Comment/# of Minutes Pt performed static seated balance 2x1 minute. Cues for upright posture.   Dynamic Sitting Balance   Dynamic Sitting - Balance Support No upper extremity supported;Right upper extremity supported;Left upper extremity supported   Dynamic Sitting - Level of Assistance 5: Stand by assistance;Other (comment)  min guard   Dynamic Sitting Balance - Compensations Pt performed sit<>lateral weight shifting onto foreams x10/UE with UEs 4 inches from body x5 and then 10 inches from body x5. Cues for technique.     Dynamic Sitting - Balance Activities Other (comment)   Sitting balance - Comments Pt performed bean bag toss 2x15 with B UEs with basket 5' in front of pt and then 10' in front of pt. Cues to not use UE for support. Performed with min guard to ensure safety.                 PT Education - 10/10/14 1348    Education provided Yes   Education Details Pt reiterated the importance of performing seated balance activities without back support in order to improve balance.   Person(s) Educated Patient;Other (comment)  pt's sister-[name]   Methods Explanation   Comprehension Verbalized understanding          PT Short Term Goals - 10/05/14 0557    PT SHORT TERM GOAL #1   Title Pt. will transfer with sliding board wheelchair to mat with +1 max assist.  (target date 08-31-14)   Status Achieved   PT SHORT TERM GOAL #2   Title Sit unsupported on side of mat x 5" with +2 mod assist   Status Achieved   PT SHORT TERM GOAL #3   Title Independent to direct HEP (stretches and PROM for bil. LE's)   Status Achieved   PT SHORT TERM GOAL #4   Title Tolerate standing in standing frame x 10" for LE stretching and weight-bearing   (08-31-14)   Status Achieved   PT SHORT TERM GOAL #5   Title Incr. endurance/activity tolerance so pt. able to perform 10' nonstop on recumbent bike (Scifit)  (08-31-14)   Status On-going           PT Long Term Goals - 10/10/14 1350    PT LONG TERM GOAL #1   Title Transfer with sliding board with +1 mod assist wheelchair to/from mat   Status Achieved   PT LONG TERM GOAL #2   Title Sit on side of mat for at least 10" with UE support prn with SBA to incr. independence with dressing, grooming,etc. at home  (09-30-14)   Status Achieved   PT LONG TERM GOAL #3   Title Sit unsupported on side of mat with 1 UE support and reach 4" anteriorly with other UE support with min assist to increase independence with ADL's with mod  (09-30-14)   Status Achieved   PT LONG TERM GOAL #4    Title Independent in updated HEP  (09-30-14)   Status Achieved   PT LONG TERM GOAL #5   Title Pt. will transfer wheelchair to/from mat with sliding board with S only after set up for placement of board (11-04-14)   Baseline min assist needed for final part of transfer onto mat   Time 4   Period Weeks   Status On-going   PT LONG TERM GOAL #6  Title Pt. will roll from supine to R and L modified independently on mat and will report ability to do so at home  (11-04-14)   Baseline min assist needed   Time 4   Period Weeks   Status On-going               Plan - 10/10/14 1349    Clinical Impression Statement Pt demonstrated progress as she was able to perform dynamic seated balance activities with less assist. Pt continues to be limited by fear of falling and fatigue, as she required rest breaks during session and positive reinforcement. Continue with POC.   Pt will benefit from skilled therapeutic intervention in order to improve on the following deficits Decreased mobility;Obesity;Decreased range of motion;Impaired flexibility;Increased muscle spasms;Decreased balance;Decreased strength   Rehab Potential Good   PT Frequency 2x / week   PT Duration 4 weeks   PT Treatment/Interventions ADLs/Self Care Home Management;Therapeutic activities;Patient/family education;Therapeutic exercise;Wheelchair mobility training;Balance training;Neuromuscular re-education;Functional mobility training   PT Next Visit Plan cont transfer training with board; sitting balance, LE stretching and strengthening   PT Home Exercise Plan LE ROM and stretching - gave pt red therabnd and instructed to do hip abduction /adduction in w/c in seated position   Consulted and Agree with Plan of Care Patient;Family member/caregiver  pt's sister-[name]        Problem List Patient Active Problem List   Diagnosis Date Noted  . 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 Dysuria 09/04/2014  . Hypertension 05/01/2014  . Ingrown  right big toenail 05/01/2014  . Hypokalemia 05/01/2014  . GERD (gastroesophageal reflux disease) 02/14/2014  . Dysphagia, pharyngoesophageal phase 02/06/2014  . Type 2 diabetes mellitus without complication 77/93/9030  . Constipation   . Neurogenic bowel   . 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 Metastatic cancer to spine 01/19/2014  . Rotator cuff tendonitis   . Acute postoperative respiratory failure 01/09/2014  . 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 Leg weakness, bilateral 12/13/2013    Dali Kraner L 10/10/2014, 1:51 PM  [facility] [address] [place], [place], [address] Phone: [phone]   Fax:  [phone]     Geoffry Paradise, PT,DPT 10/10/2014 1:52 PM Phone: [phone] Fax: [phone]

## 2014-10-12 ENCOUNTER — Ambulatory Visit: Payer: BLUE CROSS/BLUE SHIELD

## 2014-10-12 ENCOUNTER — Ambulatory Visit: Payer: BLUE CROSS/BLUE SHIELD | Admitting: Occupational Therapy

## 2014-10-12 ENCOUNTER — Encounter: Payer: Self-pay | Admitting: Occupational Therapy

## 2014-10-12 ENCOUNTER — Ambulatory Visit: Payer: BLUE CROSS/BLUE SHIELD | Admitting: Physical Therapy

## 2014-10-12 DIAGNOSIS — R29818 Other symptoms and signs involving the nervous system: Secondary | ICD-10-CM

## 2014-10-12 DIAGNOSIS — R2991 Unspecified symptoms and signs involving the musculoskeletal system: Secondary | ICD-10-CM

## 2014-10-12 DIAGNOSIS — M6281 Muscle weakness (generalized): Secondary | ICD-10-CM

## 2014-10-12 DIAGNOSIS — G8220 Paraplegia, unspecified: Secondary | ICD-10-CM

## 2014-10-12 DIAGNOSIS — G839 Paralytic syndrome, unspecified: Secondary | ICD-10-CM | POA: Diagnosis not present

## 2014-10-12 DIAGNOSIS — Z7409 Other reduced mobility: Secondary | ICD-10-CM

## 2014-10-12 DIAGNOSIS — M25619 Stiffness of unspecified shoulder, not elsewhere classified: Secondary | ICD-10-CM

## 2014-10-12 NOTE — Patient Instructions (Signed)
Neck: Retraction   Sit with back and head straight. Pull chin back to line up ear with shoulder. Do not turn or tilt head. Hold __2__ seconds. Repeat _10___ times. Do __3__ sessions per day.  Copyright  VHI. All rights reserved.    SCAPULA: Retraction   Elbows at your side. Pinch shoulder blades down and together. Do not shrug shoulders. Hold _2__ seconds.  _10__ reps per set, 3 sets per day, _4__ days per week  Copyright  VHI. All rights reserved.

## 2014-10-12 NOTE — Therapy (Signed)
[facility] [address] [place], [place], [address] Phone: [phone]   Fax:  [phone]  Physical Therapy Treatment  Patient Details  Name: Sarah [name] MRN: [id number] Date of Birth: [birth year] Referring Provider:  Elberta Leatherwood, MD  Encounter Date: 10/12/2014      PT End of Session - 10/12/14 1157    Visit Number 20   Number of Visits 27  verify number of visits with primary PT.   Date for PT Re-Evaluation 11/04/14   Authorization Type BC[employer]   Authorization Time Period 03-24-14  - 03-24-15   Authorization - Visit Number 15   Authorization - Number of Visits 30   PT Start Time 1106  pt finishing up with OT   PT Stop Time 1147   PT Time Calculation (min) 41 min   Equipment Utilized During Treatment Gait belt   Activity Tolerance Patient tolerated treatment well   Behavior During Therapy WFL for tasks assessed/performed      Past Medical History  Diagnosis Date  . Hypertension   . DDD (degenerative disc disease), cervical   . DJD (degenerative joint disease)   . Obesity   . Chronic back pain   . 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 History of rectal fissure   . Rotator cuff tendonitis right    Past Surgical History  Procedure Laterality Date  . Tonsillectomy    . Laminectomy N/A 12/14/2013    Procedure: THORACIC LAMINECTOMY FOR TUMOR THORACIC THREE;  Surgeon: Ashok Pall, MD;  Location: [facility];  Service: Neurosurgery;  Laterality: N/A;  THORACIC LAMINECTOMY FOR TUMOR THORACIC THREE  . Posterior lumbar fusion 4 level N/A 12/30/2013    Procedure: Thoracic one-Thoracic five posterior thoracic fusion with pedicle screws;  Surgeon: Ashok Pall, MD;  Location: [facility];  Service: Neurosurgery;  Laterality: N/A;  Thoracic one-Thoracic five posterior thoracic fusion with pedicle screws  . Thyroidectomy N/A 01/09/2014    Procedure: TOTAL THYROIDECTOMY;  Surgeon:  Izora Gala, MD;  Location: [facility];  Service: ENT;  Laterality: N/A;    There were no vitals filed for this visit.  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   [name] accompained by: Family member   Patient Stated Goals Improve mobility   Currently in Pain? Yes   Pain Score 2    Pain Location Leg   Pain Orientation Left;Right   Pain Descriptors / Indicators Tingling;Numbness   Pain Type Chronic pain   Pain Onset More than a month ago   Pain Frequency Constant   Aggravating Factors  nothing   Pain Relieving Factors  Tylenol          Therex: Pt performed seated cervical and B scapular retraction with VC's, demonstration and tactile cues. Pt required min guard to min A during scapular retraction to maintain balance without UE support. Please see pt instructions for details, as PT added exercises to pt's HEP.               [facility] Adult PT Treatment/Exercise - 10/12/14 1155    Transfers   Transfers Lateral/Scoot Transfers  slide board   Lateral/Scoot Transfers 4: Min guard;4: Min assist   Lateral/Scoot Transfer Details (indicate cue type and reason) Pt performed w/c<>mat slide  board transfers with min guard at trunk and intermittent min A for LE placement. Cues for head/hips relationship.   Dynamic Sitting Balance   Dynamic Sitting - Balance Support Right upper extremity supported;Left upper extremity supported;No upper extremity supported   Dynamic Sitting - Level of Assistance Other (comment);5: Stand by assistance  min guard   Dynamic Sitting Balance - Compensations Pt performed sit<>lateral weight shifting onto forearms x5/UE. Pt also tossed ball to herself, tossed/caught ball with pt's sister, and reached with B UEs outside BOS with 0-1 UE support.                PT Education - 10/12/14 1154     Education provided Yes   Education Details Strengthening HEP   Person(s) Educated Patient;Caregiver(s)  pt's sister-[name]   Methods Explanation;Demonstration;Tactile cues;Verbal cues;Handout   Comprehension Returned demonstration;Verbalized understanding          PT Short Term Goals - 10/05/14 0557    PT SHORT TERM GOAL #1   Title Pt. will transfer with sliding board wheelchair to mat with +1 max assist.  (target date 08-31-14)   Status Achieved   PT SHORT TERM GOAL #2   Title Sit unsupported on side of mat x 5" with +2 mod assist   Status Achieved   PT SHORT TERM GOAL #3   Title Independent to direct HEP (stretches and PROM for bil. LE's)   Status Achieved   PT SHORT TERM GOAL #4   Title Tolerate standing in standing frame x 10" for LE stretching and weight-bearing   (08-31-14)   Status Achieved   PT SHORT TERM GOAL #5   Title Incr. endurance/activity tolerance so pt. able to perform 10' nonstop on recumbent bike (Scifit)  (08-31-14)   Status On-going           PT Long Term Goals - 10/10/14 1350    PT LONG TERM GOAL #1   Title Transfer with sliding board with +1 mod assist wheelchair to/from mat   Status Achieved   PT LONG TERM GOAL #2   Title Sit on side of mat for at least 10" with UE support prn with SBA to incr. independence with dressing, grooming,etc. at home  (09-30-14)   Status Achieved   PT LONG TERM GOAL #3   Title Sit unsupported on side of mat with 1 UE support and reach 4" anteriorly with other UE support with min assist to increase independence with ADL's with mod  (09-30-14)   Status Achieved   PT LONG TERM GOAL #4   Title Independent in updated HEP  (09-30-14)   Status Achieved   PT LONG TERM GOAL #5   Title Pt. will transfer wheelchair to/from mat with sliding board with S only after set up for placement of board (11-04-14)   Baseline min assist needed for final part of transfer onto mat   Time 4   Period Weeks   Status On-going   PT LONG TERM GOAL #6    Title Pt. will roll from supine to R and L modified independently on mat and will report ability to do so at home  (11-04-14)   Baseline min assist needed   Time 4   Period Weeks   Status On-going               Plan - 10/12/14 1158    Clinical Impression Statement Pt demonstrated progress towards goals, as she required less assist during transfers today and only required min A to  assist LE placement during transfer. Pt demonstrated proper technique of strengthening HEP to improve posture, with minimal cues. Continue with POC.   Pt will benefit from skilled therapeutic intervention in order to improve on the following deficits Decreased mobility;Obesity;Decreased range of motion;Impaired flexibility;Increased muscle spasms;Decreased balance;Decreased strength   Rehab Potential Good   PT Frequency 2x / week   PT Duration 4 weeks   PT Treatment/Interventions ADLs/Self Care Home Management;Therapeutic activities;Patient/family education;Therapeutic exercise;Wheelchair mobility training;Balance training;Neuromuscular re-education;Functional mobility training   PT Next Visit Plan Bed mobility (rolling to R and L), dynamic seated balance and transfers.   PT Home Exercise Plan LE ROM and stretching - gave pt red therabnd and instructed to do hip abduction /adduction in w/c in seated position, strengthening HEP   Consulted and Agree with Plan of Care Patient;Family member/caregiver   Family Member Consulted pt's sister-[name]        Problem List Patient Active Problem List   Diagnosis Date Noted  . 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 Dysuria 09/04/2014  . Hypertension 05/01/2014  . Ingrown right big toenail 05/01/2014  . Hypokalemia 05/01/2014  . GERD (gastroesophageal reflux disease) 02/14/2014  . Dysphagia, pharyngoesophageal phase 02/06/2014  . Type 2 diabetes mellitus without complication 76/22/6333  . Constipation   . Neurogenic bowel   . 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  Metastatic cancer to spine 01/19/2014  . Rotator cuff tendonitis   . Acute postoperative respiratory failure 01/09/2014  . 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 Leg weakness, bilateral 12/13/2013    Nora Sabey L 10/12/2014, 12:00 PM  [facility] [address] [place], [place], [address] Phone: [phone]   Fax:  [phone]     Geoffry Paradise, PT,DPT 10/12/2014 12:00 PM Phone: [phone] Fax: [phone]

## 2014-10-12 NOTE — Therapy (Signed)
[facility] [address] [place], [place], [address] Phone: [phone]   Fax:  [phone]  Occupational Therapy Evaluation  Patient Details  Name: Sarah [name] MRN: [id number] Date of Birth: 08/10/1965 Referring Provider:  Elberta Leatherwood, MD  Encounter Date: 10/12/2014      OT End of Session - 10/12/14 1802    Visit Number 1   Number of Visits 16   Date for OT Re-Evaluation 12/13/14   Authorization Type BCBS primary (63 OT visits) / medicaid secondary (6 OT medicaid visits used)   OT Start Time 1017   OT Stop Time 1100   OT Time Calculation (min) 43 min   Activity Tolerance Patient tolerated treatment well   Behavior During Therapy [facility] for tasks assessed/performed      Past Medical History  Diagnosis Date  . Hypertension   . DDD (degenerative disc disease), cervical   . DJD (degenerative joint disease)   . Obesity   . Chronic back pain   . 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 History of rectal fissure   . Rotator cuff tendonitis right    Past Surgical History  Procedure Laterality Date  . Tonsillectomy    . Laminectomy N/A 12/14/2013    Procedure: THORACIC LAMINECTOMY FOR TUMOR THORACIC THREE;  Surgeon: Ashok Pall, MD;  Location: [place] [facility];  Service: Neurosurgery;  Laterality: N/A;  THORACIC LAMINECTOMY FOR TUMOR THORACIC THREE  . Posterior lumbar fusion 4 level N/A 12/30/2013    Procedure: Thoracic one-Thoracic five posterior thoracic fusion with pedicle screws;  Surgeon: Ashok Pall, MD;  Location: [place] [facility];  Service: Neurosurgery;  Laterality: N/A;  Thoracic one-Thoracic five posterior thoracic fusion with pedicle screws  . Thyroidectomy N/A 01/09/2014    Procedure: TOTAL THYROIDECTOMY;  Surgeon: Izora Gala, MD;  Location: [place];  Service: ENT;  Laterality: N/A;    There were no vitals filed for this visit.  Visit Diagnosis:  Paraplegia  Generalized  muscle weakness  Stiffness of joint, shoulder region, unspecified laterality  Decreased transfer ability      Subjective Assessment - 10/12/14 1024    Subjective  Pt reports that she really wants to be able to do more with her toileting/bathing   [name] accompained by: Family member  sister   Pertinent History s/p laminectomy 12/14/13, s/p lumbar fusion 12/30/13 by Dr. Cyndy Freeze (secondary to tumor)   Patient Stated Goals improve ADLs (bathing, toileting)   Currently in Pain? Yes   Pain Score 2    Pain Location Leg   Pain Orientation Right;Left   Pain Descriptors / Indicators Tingling;Numbness   Pain Type Chronic pain   Aggravating Factors  nothing    Pain Relieving Factors Tylenol           [facility] OT Assessment - 10/12/14 0001    Assessment   Diagnosis paraplegia T4  level (SCI) s/p laminectomy 12/14/13, lumbar fusion 12/30/13 (secondary to thoracic tumor)   Onset Date 12/14/13   Assessment hx of 3 related surgeries:  thyroid CA, tumor in spine, rods/screws placed in back per pt report   Prior Therapy Inpatient rehab 01/19/14 - 02/10/14. Discharged from hospital on 02/10/14, 6 visits outpt. OT with last 08/25/14   Precautions   Precautions Fall   Precaution Comments nonambulatory   Balance Screen   Has the patient fallen in the past 6 months No   Home  Environment   Family/patient expects to  be discharged to: Private residence   Type of [place] entrance   Lives With Family  sister and brother-in-law   Prior Function   Level of Independence Independent with basic ADLs;Independent with homemaking with ambulation   Vocation Full time employment;On disability  disability now   ADL   Eating/Feeding Independent   Grooming Independent   Upper Body Bathing Moderate assistance   Lower Body Bathing --  dependent   Upper Body Dressing --  mod I   Lower Body Dressing --  mod-max A   Toilet Tranfer --  using bedpan currently   Toileting -  Hygiene --   dependent for bowel movements, performing with urination   Tub/Shower Transfer --  sponge bathing   ADL comments Has BSC (but not using), hospital bed, sliding board, power w/c, has leg lifters, hoyer lift (not using), has tub transfer bench (not using), manual w/c.  Pt reports that she is unable to get power w/c in bathroom.   IADL   Shopping Needs to be accompanied on any shopping trip   Light Housekeeping Does not participate in any housekeeping tasks   Meal Prep Able to complete simple warm meal prep  microwave, boil water   Community Mobility Relies on family or friends for transportation   Mobility   Mobility Status Comments uses power w/c, uses sliding board for transfers with board placement and min A per PT.  Pt reports that she can sit unassisted on EOB, but needs UE support.  Pt can only lean forward/reach to knees with BUEs while in w/c.   Written Expression   Dominant Hand Right   Sensation   Additional Comments mild impairment/tingling in LEs per pt   ROM / Strength   AROM / PROM / Strength AROM   AROM   Overall AROM Comments BUE AROM except shoulder flex:  120 degrees bilaterally, R shoulder abduction 120 degrees;  posture/decreased trunk control impacting.  Pt reports old RTC problems as well (R>L)   Strength   Strength Assessment Site Shoulder;Elbow   Right/Left Shoulder Right;Left   Right Shoulder Flexion 3+/5   Right Shoulder ABduction 4-/5   Right Shoulder Horizontal ABduction 4-/5   Right Shoulder Horizontal ADduction 4-/5   Left Shoulder Flexion 4-/5   Left Shoulder ABduction 4-/5   Left Shoulder Horizontal ABduction --  4-/5   Left Shoulder Horizontal ADduction 4-/5   Right/Left Elbow --  grossly 4+/5 bilaterally (biceps/triceps)   Hand Function   Right Hand Grip (lbs) 36   Left Hand Grip (lbs) 49                         OT Education - 10/12/14 1748    Education Details Basic info regarding Voc Rehab/Independent Living Resources    Person(s) Educated Patient;Caregiver(s)   Methods Explanation   Comprehension Verbalized understanding          OT Short Term Goals - 10/12/14 1809    OT SHORT TERM GOAL #1   Title Independent with updated HEP for UE strengthening--check STGs 11/12/14   Baseline dependent   Time 4   Period Weeks   Status New   OT SHORT TERM GOAL #2   Title Pt will peform UB bathing on edge of bed with min A.   Baseline mod A lying in bed   Status New   OT SHORT TERM GOAL #3   Title Pt to perform transfer to [facility]  using sliding board with mod A.   Baseline not performing, using bed pan   Time 4   Status New   OT SHORT TERM GOAL #4   Title Pt will perform LB dressing with mod A using AE prn.   Baseline mod-max A for LB dressing.   Time 4   Status New   OT SHORT TERM GOAL #5   Title Pt will verbalize understanding of appropriate community resources prn.   Baseline dependent   Time 4   Period Weeks   Status New           OT Long Term Goals - 10/12/14 1814    OT LONG TERM GOAL #1   Title Pt will perform UB/LB bathing mod I once transfer to tub bench is completed using AE/DME prn.   Baseline dependent LB, bathing in bed   Time 8   Period Weeks   Status New   OT LONG TERM GOAL #2   Title Pt will peform sliding board transfer to tub bench with min A.   Baseline sponge bathing in bed, not performin   Time 8   Period Weeks   Status New   OT LONG TERM GOAL #3   Title Pt to perform transfer to [facility] using sliding board with min A.   Baseline not peforming, using bed pan   Time 8   Period Weeks   Status New   OT LONG TERM GOAL #4   Title Pt will perform toileting hygiene independently after bowel movement using BSC.   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  Pt presents today with decr ADL performance due to decr trunk  control, paraplegia, decreased strength, decr transfers, decreased shoulder ROM.  Pt would benefit from occupational therapy to improve pt's ability in ADLs/IADLs.   Pt will benefit from skilled therapeutic intervention in order to improve on the following deficits (Retired) Decreased knowledge of use of DME;Impaired flexibility;Decreased coordination;Decreased mobility;Improper body mechanics;Decreased endurance;Decreased activity tolerance;Decreased range of motion;Decreased strength;Other (comment);Impaired UE functional use;Decreased balance   Rehab Potential Good   OT Frequency 2x / week   OT Duration 8 weeks  +eval   OT Treatment/Interventions Self-care/ADL training;Neuromuscular education;Therapeutic exercise;Patient/family education;DME and/or AE instruction;Passive range of motion;Manual Therapy;Therapeutic activities;Cryotherapy;Moist Heat;Energy conservation;Therapist, nutritional;Therapeutic exercises;Ultrasound   Plan transfers for ADLs, trunk  control   Consulted and Agree with Plan of Care Patient;Family member/caregiver        Problem List Patient Active Problem List   Diagnosis Date Noted  . 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 Dysuria 09/04/2014  . Hypertension 05/01/2014  . Ingrown right big toenail 05/01/2014  . Hypokalemia 05/01/2014  . GERD (gastroesophageal reflux disease) 02/14/2014  . Dysphagia, pharyngoesophageal phase 02/06/2014  . Type 2 diabetes mellitus without complication 93/71/6967  . Constipation   . Neurogenic bowel   . 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 Metastatic cancer to spine 01/19/2014  . Rotator cuff tendonitis   . Acute postoperative respiratory failure 01/09/2014  . 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 Leg weakness, bilateral 12/13/2013    [facility] 10/12/2014, 6:22 PM  [place] [address] [place], [place], [address] Phone:  [phone]   Fax:  [place], OTR/L 10/12/2014 6:22 PM

## 2014-10-16 ENCOUNTER — Ambulatory Visit: Payer: BLUE CROSS/BLUE SHIELD | Admitting: Physical Therapy

## 2014-10-16 ENCOUNTER — Encounter: Payer: Self-pay | Admitting: Occupational Therapy

## 2014-10-16 ENCOUNTER — Encounter: Payer: Self-pay | Admitting: Physical Therapy

## 2014-10-16 ENCOUNTER — Ambulatory Visit: Payer: BLUE CROSS/BLUE SHIELD | Admitting: Occupational Therapy

## 2014-10-16 DIAGNOSIS — R2991 Unspecified symptoms and signs involving the musculoskeletal system: Secondary | ICD-10-CM

## 2014-10-16 DIAGNOSIS — R29818 Other symptoms and signs involving the nervous system: Secondary | ICD-10-CM

## 2014-10-16 DIAGNOSIS — M6281 Muscle weakness (generalized): Secondary | ICD-10-CM

## 2014-10-16 DIAGNOSIS — Z7409 Other reduced mobility: Secondary | ICD-10-CM

## 2014-10-16 DIAGNOSIS — G8220 Paraplegia, unspecified: Secondary | ICD-10-CM

## 2014-10-16 DIAGNOSIS — G839 Paralytic syndrome, unspecified: Secondary | ICD-10-CM | POA: Diagnosis not present

## 2014-10-16 NOTE — Therapy (Signed)
[place] [address] [place], [place], [address] Phone: [phone]   Fax:  [phone]  Physical Therapy Treatment  Patient Details  Name: Sarah [name] MRN: [id number] Date of Birth: Nov 29, 1965 Referring Provider:  Elberta Leatherwood, MD  Encounter Date: 10/16/2014      PT End of Session - 10/16/14 1522    Visit Number 21   Number of Visits 27   Date for PT Re-Evaluation 11/04/14   Authorization Type BCBS   Authorization Time Period 03-24-14  - 03-24-15   Authorization - Visit Number 16   Authorization - Number of Visits 30   PT Start Time 0932   PT Stop Time 1016   PT Time Calculation (min) 44 min      Past Medical History  Diagnosis Date  . Hypertension   . DDD (degenerative disc disease), cervical   . DJD (degenerative joint disease)   . Obesity   . Chronic back pain   . 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 History of rectal fissure   . Rotator cuff tendonitis right    Past Surgical History  Procedure Laterality Date  . Tonsillectomy    . Laminectomy N/A 12/14/2013    Procedure: THORACIC LAMINECTOMY FOR TUMOR THORACIC THREE;  Surgeon: Ashok Pall, MD;  Location: [place] [facility];  Service: Neurosurgery;  Laterality: N/A;  THORACIC LAMINECTOMY FOR TUMOR THORACIC THREE  . Posterior lumbar fusion 4 level N/A 12/30/2013    Procedure: Thoracic one-Thoracic five posterior thoracic fusion with pedicle screws;  Surgeon: Ashok Pall, MD;  Location: [place] [facility];  Service: Neurosurgery;  Laterality: N/A;  Thoracic one-Thoracic five posterior thoracic fusion with pedicle screws  . Thyroidectomy N/A 01/09/2014    Procedure: TOTAL THYROIDECTOMY;  Surgeon: Izora Gala, MD;  Location: [place];  Service: ENT;  Laterality: N/A;    There were no vitals filed for this visit.  Visit Diagnosis:  Generalized muscle weakness  Decreased functional mobility and endurance  Poor trunk  control  Paraplegia      Subjective Assessment - 10/16/14 1506    Subjective Pt. reports not changing positions very much in bed - sleeps on her back; emphasized need to turn and lie in sidelying position frequently to prevent skin breakdown   [name] accompained by: Family member   Patient Stated Goals Improve mobility   Currently in Pain? Yes   Pain Score 2    Pain Location Abdomen  due to spasticity   Pain Orientation Upper   Pain Descriptors / Indicators Spasm   Pain Type Chronic pain   Pain Onset More than a month ago   Pain Frequency Intermittent                         [facility] Adult PT Treatment/Exercise - 10/16/14 0950    Bed Mobility   Bed Mobility Rolling Right;Rolling Left  rolling prone   Rolling Right 4: Min assist   Rolling Right Details (indicate cue type and reason) cues to flex LLE   Supine to Sit 3: Mod assist  needs assist to transfer LE's off mat   Sit to Supine 3: Mod assist  to transfer LE's onto mat   Transfers   Transfers Lateral/Scoot Transfers  slide board   Lateral/Scoot Transfers 4: Min guard      TherAct; sit to supine transfer with mod to max assist to transfer LE's onto  mat; rolling to R sidelying with hand held Assist to pull upper body; cues given to flex LLE;  Rolling to prone from R sidelying with mod assist and max assist To position RUE underneath for prone on forearms; scapular squeezes in prone position  TherEx:  Bridging attempted - 10 reps; trunk ext. On upside down chair with 2 pillows - 10 reps for trunk ext. And flexion  strengthening and stretching abdominals with cues for thoracic extension in reclined position          PT Education - 10/16/14 1521    Education provided Yes   Education Details stressed need to lie prone and in sidelying positions to prevent skin breakdown   Person(s) Educated Patient  sister   Methods Explanation;Demonstration   Comprehension Verbalized understanding           PT Short Term Goals - 10/05/14 0557    PT SHORT TERM GOAL #1   Title Pt. will transfer with sliding board wheelchair to mat with +1 max assist.  (target date 08-31-14)   Status Achieved   PT SHORT TERM GOAL #2   Title Sit unsupported on side of mat x 5" with +2 mod assist   Status Achieved   PT SHORT TERM GOAL #3   Title Independent to direct HEP (stretches and PROM for bil. LE's)   Status Achieved   PT SHORT TERM GOAL #4   Title Tolerate standing in standing frame x 10" for LE stretching and weight-bearing   (08-31-14)   Status Achieved   PT SHORT TERM GOAL #5   Title Incr. endurance/activity tolerance so pt. able to perform 10' nonstop on recumbent bike (Scifit)  (08-31-14)   Status On-going           PT Long Term Goals - 10/10/14 1350    PT LONG TERM GOAL #1   Title Transfer with sliding board with +1 mod assist wheelchair to/from mat   Status Achieved   PT LONG TERM GOAL #2   Title Sit on side of mat for at least 10" with UE support prn with SBA to incr. independence with dressing, grooming,etc. at home  (09-30-14)   Status Achieved   PT LONG TERM GOAL #3   Title Sit unsupported on side of mat with 1 UE support and reach 4" anteriorly with other UE support with min assist to increase independence with ADL's with mod  (09-30-14)   Status Achieved   PT LONG TERM GOAL #4   Title Independent in updated HEP  (09-30-14)   Status Achieved   PT LONG TERM GOAL #5   Title Pt. will transfer wheelchair to/from mat with sliding board with S only after set up for placement of board (11-04-14)   Baseline min assist needed for final part of transfer onto mat   Time 4   Period Weeks   Status On-going   PT LONG TERM GOAL #6   Title Pt. will roll from supine to R and L modified independently on mat and will report ability to do so at home  (11-04-14)   Baseline min assist needed   Time 4   Period Weeks   Status On-going               Plan - 10/16/14 1523    Clinical Impression  Statement Pt demonstrating improved mobility with transfers with only min assist needed for sliding board transfers from wheelchair to mat; needs mod assist to roll prone due to discomfort in  R shoulder during sidelying to rolling prone (to properly position RUE); pt reported less tightness wth stretching; cont to have significant fear of falling or losing balance but is improving with this anxiety with incr. mobility                                              Pt will benefit from skilled therapeutic intervention in order to improve on the following deficits Decreased mobility;Obesity;Decreased range of motion;Impaired flexibility;Increased muscle spasms;Decreased balance;Decreased strength   Rehab Potential Good   PT Frequency 2x / week   PT Duration 4 weeks   PT Treatment/Interventions ADLs/Self Care Home Management;Therapeutic activities;Patient/family education;Therapeutic exercise;Wheelchair mobility training;Balance training;Neuromuscular re-education;Functional mobility training   PT Next Visit Plan Bed mobility (rolling to R and L), rolling prone   PT Home Exercise Plan LE ROM and stretching - gave pt red therabnd and instructed to do hip abduction /adduction in w/c in seated position, strengthening HEP   Consulted and Agree with Plan of Care Patient;Family member/caregiver   Family Member Consulted pt's sister-[name]        Problem List Patient Active Problem List   Diagnosis Date Noted  . 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 Dysuria 09/04/2014  . Hypertension 05/01/2014  . Ingrown right big toenail 05/01/2014  . Hypokalemia 05/01/2014  . GERD (gastroesophageal reflux disease) 02/14/2014  . Dysphagia, pharyngoesophageal phase 02/06/2014  . Type 2 diabetes mellitus without complication 73/42/8768  . Constipation   . Neurogenic bowel   . 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 Metastatic cancer to spine 01/19/2014  . Rotator cuff tendonitis   . Acute postoperative respiratory failure  01/09/2014  . 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 Leg weakness, bilateral 12/13/2013    TLXBWI, OMBTD HRCBULA, PT 10/16/2014, 3:41 PM  [place] [place] [address] [place], [place], [address] Phone: [phone]   Fax:  [phone]

## 2014-10-16 NOTE — Therapy (Signed)
[facility] [address] [place], [place], [address] Phone: [phone]   Fax:  [phone]  Occupational Therapy Treatment  Patient Details  Name: Sarah [name] MRN: [id number] Date of Birth: [birth year] Referring Provider:  Elberta Leatherwood, MD  Encounter Date: 10/16/2014      OT End of Session - 10/16/14 1304    Visit Number 2   Number of Visits 16   Date for OT Re-Evaluation 12/13/14   Authorization Type BCBS primary (15 OT visits) / medicaid secondary (6 OT medicaid visits used)   OT Start Time 1017   OT Stop Time 1102   OT Time Calculation (min) 45 min   Activity Tolerance Patient tolerated treatment well   Behavior During Therapy [facility] for tasks assessed/performed      Past Medical History  Diagnosis Date  . Hypertension   . DDD (degenerative disc disease), cervical   . DJD (degenerative joint disease)   . Obesity   . Chronic back pain   . 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 History of rectal fissure   . Rotator cuff tendonitis right    Past Surgical History  Procedure Laterality Date  . Tonsillectomy    . Laminectomy N/A 12/14/2013    Procedure: THORACIC LAMINECTOMY FOR TUMOR THORACIC THREE;  Surgeon: Ashok Pall, MD;  Location: [place] [facility];  Service: Neurosurgery;  Laterality: N/A;  THORACIC LAMINECTOMY FOR TUMOR THORACIC THREE  . Posterior lumbar fusion 4 level N/A 12/30/2013    Procedure: Thoracic one-Thoracic five posterior thoracic fusion with pedicle screws;  Surgeon: Ashok Pall, MD;  Location: [place] [facility];  Service: Neurosurgery;  Laterality: N/A;  Thoracic one-Thoracic five posterior thoracic fusion with pedicle screws  . Thyroidectomy N/A 01/09/2014    Procedure: TOTAL THYROIDECTOMY;  Surgeon: Izora Gala, MD;  Location: [place];  Service: ENT;  Laterality: N/A;    There were no vitals filed for this visit.  Visit Diagnosis:  Generalized muscle  weakness  Poor trunk control  Decreased functional mobility and endurance  Decreased transfer ability      Subjective Assessment - 10/16/14 1019    Subjective  I'm tired   [name] accompained by: Family member   Pertinent History s/p laminectomy 12/14/13, s/p lumbar fusion 12/30/13 by Dr. Cyndy Freeze (secondary to tumor)   Patient Stated Goals improve ADLs (bathing, toileting)   Currently in Pain? Yes   Pain Score 2    Pain Location --  stomach/back   Pain Orientation Upper   Pain Descriptors / Indicators Aching   Aggravating Factors  nothing   Pain Relieving Factors tylenol                      OT Treatments/Exercises (OP) - 10/16/14 0001    ADLs   Grooming Pt instructed to perform grooming sitting edge of bed for incr trunk control.  Encouraged pt to sit edge of bed and transfer more at home for incr trunk control and endurance.  Pt/sister verbalized understanding.   Neurological Re-education Exercises   Other Exercises 1 Seated edge of mat, activities to encourage trunk control for ADLs including weight shifts, functional reaching in various locations (with 1 UE support), scapular retraction with mod cues, facilitation for anterior pelvic tilt with max A, bilateral UE tasks to simulate ADLs bedside with tabletop support prn, functional movements to simulate grooming with UEs and min cueing for posture.  Transfer mat>w/c with sliding board with min A after LE/board placement.           OT Education - 10/16/14 1628    Education Details Voc Rehab/Independent Living Resources/services and contact info   Person(s) Educated Patient;Caregiver(s)   Methods Explanation;Handout   Comprehension Verbalized understanding          OT Short Term Goals - 10/12/14 1809    OT SHORT TERM GOAL #1   Title Independent with updated HEP for UE strengthening--check STGs 11/12/14   Baseline dependent   Time 4   Period Weeks   Status New   OT SHORT TERM GOAL #2    Title Pt will peform UB bathing on edge of bed with min A.   Baseline mod A lying in bed   Status New   OT SHORT TERM GOAL #3   Title Pt to perform transfer to [facility] using sliding board with mod A.   Baseline not performing, using bed pan   Time 4   Status New   OT SHORT TERM GOAL #4   Title Pt will perform LB dressing with mod A using AE prn.   Baseline mod-max A for LB dressing.   Time 4   Status New   OT SHORT TERM GOAL #5   Title Pt will verbalize understanding of appropriate community resources prn.   Baseline dependent   Time 4   Period Weeks   Status New           OT Long Term Goals - 10/12/14 1814    OT LONG TERM GOAL #1   Title Pt will perform UB/LB bathing mod I once transfer to tub bench is completed using AE/DME prn.   Baseline dependent LB, bathing in bed   Time 8   Period Weeks   Status New   OT LONG TERM GOAL #2   Title Pt will peform sliding board transfer to tub bench with min A.   Baseline sponge bathing in bed, not performin   Time 8   Period Weeks   Status New   OT LONG TERM GOAL #3   Title Pt to perform transfer to [facility] using sliding board with min A.   Baseline not peforming, using bed pan   Time 8   Period Weeks   Status New   OT LONG TERM GOAL #4   Title Pt will perform toileting hygiene independently after bowel movement using BSC.   Baseline dependent   Time 8   Period Weeks   Status New               Plan - 10/16/14 1305    Clinical Impression Statement Pt tolerated treatment well but fatigues limits and pt needs multiple rest breaks.  Pt responds well to cues but lacks confidence in movement/ability.   Plan trunk control, weight shifts/transfers for ADLs   Consulted and Agree with Plan of Care Family member/caregiver;Patient        Problem List Patient Active Problem List   Diagnosis Date Noted  . 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 Dysuria 09/04/2014  . Hypertension 05/01/2014  . Ingrown right big toenail 05/01/2014  .  Hypokalemia 05/01/2014  . GERD (gastroesophageal reflux disease) 02/14/2014  . Dysphagia, pharyngoesophageal phase 02/06/2014  . Type 2 diabetes mellitus without complication 09/32/6712  . Constipation   . Neurogenic bowel   . 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 Metastatic cancer to spine 01/19/2014  . Rotator cuff tendonitis   .  Acute postoperative respiratory failure 01/09/2014  . 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 Leg weakness, bilateral 12/13/2013    [facility] 10/16/2014, 4:32 PM  [facility] [address] [place], [place], [address] Phone: [phone]   Fax:  [place], OTR/L 10/16/2014 4:32 PM

## 2014-10-17 ENCOUNTER — Ambulatory Visit: Payer: BLUE CROSS/BLUE SHIELD | Admitting: Physical Therapy

## 2014-10-17 DIAGNOSIS — Z7409 Other reduced mobility: Secondary | ICD-10-CM

## 2014-10-17 DIAGNOSIS — M6281 Muscle weakness (generalized): Secondary | ICD-10-CM

## 2014-10-17 DIAGNOSIS — G8220 Paraplegia, unspecified: Secondary | ICD-10-CM

## 2014-10-17 DIAGNOSIS — G839 Paralytic syndrome, unspecified: Secondary | ICD-10-CM | POA: Diagnosis not present

## 2014-10-18 ENCOUNTER — Encounter: Payer: Self-pay | Admitting: Physical Therapy

## 2014-10-18 NOTE — Therapy (Signed)
[facility] [address] [place], [place], [address] Phone: [phone]   Fax:  [phone]  Physical Therapy Treatment  Patient Details  Name: Sarah [name] MRN: [id number] Date of Birth: 1966-01-09 Referring Provider:  Elberta Leatherwood, MD  Encounter Date: 10/17/2014      PT End of Session - 10/18/14 1426    Visit Number 22   Number of Visits 27   Date for PT Re-Evaluation 11/04/14   Authorization Type BCBS   Authorization Time Period 03-24-14  - 03-24-15   Authorization - Visit Number 50   Authorization - Number of Visits 30   PT Start Time 0851   PT Stop Time 0936   PT Time Calculation (min) 45 min      Past Medical History  Diagnosis Date  . Hypertension   . DDD (degenerative disc disease), cervical   . DJD (degenerative joint disease)   . Obesity   . Chronic back pain   . 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 History of rectal fissure   . Rotator cuff tendonitis right    Past Surgical History  Procedure Laterality Date  . Tonsillectomy    . Laminectomy N/A 12/14/2013    Procedure: THORACIC LAMINECTOMY FOR TUMOR THORACIC THREE;  Surgeon: Ashok Pall, MD;  Location: [facility];  Service: Neurosurgery;  Laterality: N/A;  THORACIC LAMINECTOMY FOR TUMOR THORACIC THREE  . Posterior lumbar fusion 4 level N/A 12/30/2013    Procedure: Thoracic one-Thoracic five posterior thoracic fusion with pedicle screws;  Surgeon: Ashok Pall, MD;  Location: [facility];  Service: Neurosurgery;  Laterality: N/A;  Thoracic one-Thoracic five posterior thoracic fusion with pedicle screws  . Thyroidectomy N/A 01/09/2014    Procedure: TOTAL THYROIDECTOMY;  Surgeon: Izora Gala, MD;  Location: [facility];  Service: ENT;  Laterality: N/A;    There were no vitals filed for this visit.  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 reports no new changes since previous visit; reports she practiced sitting unsupported in wheelchair   Patient Stated Goals Improve mobility   Currently in Pain? Yes   Pain Score 3    Pain Location Abdomen   Pain Orientation Upper   Pain Descriptors / Indicators Spasm;Tightness   Pain Type Chronic pain   Pain Onset More than a month ago   Pain Frequency Intermittent   Aggravating Factors  no specific factors   Pain Relieving Factors Tylenol                         [facility] Adult PT Treatment/Exercise - 10/18/14 0001    Bed Mobility   Bed Mobility Rolling Right;Rolling Left  rolling prone   Rolling Right 4: Min assist   Rolling Right Details (indicate cue type and reason) cues to flex opposite LE to assist with rolling   Rolling Left 4: Min assist   Supine to Sit 3: Mod assist  needs assist to transfer LE's off mat   Supine to Sit Details (indicate cue type and reason) difficulty with pushing up to sitting   Sit to Supine 3: Mod assist  to transfer LE's onto mat   Transfers   Transfers Lateral/Scoot Transfers   Lateral/Scoot Transfers 4: Min guard   Research officer, trade [employer] Details (  indicate cue type and reason) needs cues to weight shift laterally to unweight other hip to slide backward on mat   Dynamic Sitting Balance   Dynamic Sitting - Balance Support No upper extremity supported   Dynamic Sitting - Level of Assistance 4: Min assist   Dynamic Sitting Balance - Compensations moving UE's from adduction to abducted position with elobws extended;  trunk rotation x 5 reps to each side without UE support     TherEx:  Sitting unsupported - using red theraband to perform resisted scapular retraction with elbows flexed 10 reps 2 sets with Supervision; elbows extended - use of band attempted in horizontal adduction/abduction x 5 reps - pt felt Too unsteady so exercise was revised to performing  without band - with UE movement only Trunk flex/ext with limits of stability - no chair used - utilized this technique  With pt to increase confidence with mobility and To decr. Fear of falling with active movement          PT Education - 10/18/14 1426    Education provided Yes   Education Details emphasized need to practice sitting unsupported in recliner or wheelchair at home   Person(s) Educated Patient   Methods Explanation   Comprehension Verbalized understanding          PT Short Term Goals - 10/05/14 0557    PT SHORT TERM GOAL #1   Title Pt. will transfer with sliding board wheelchair to mat with +1 max assist.  (target date 08-31-14)   Status Achieved   PT SHORT TERM GOAL #2   Title Sit unsupported on side of mat x 5" with +2 mod assist   Status Achieved   PT SHORT TERM GOAL #3   Title Independent to direct HEP (stretches and PROM for bil. LE's)   Status Achieved   PT SHORT TERM GOAL #4   Title Tolerate standing in standing frame x 10" for LE stretching and weight-bearing   (08-31-14)   Status Achieved   PT SHORT TERM GOAL #5   Title Incr. endurance/activity tolerance so pt. able to perform 10' nonstop on recumbent bike (Scifit)  (08-31-14)   Status On-going           PT Long Term Goals - 10/10/14 1350    PT LONG TERM GOAL #1   Title Transfer with sliding board with +1 mod assist wheelchair to/from mat   Status Achieved   PT LONG TERM GOAL #2   Title Sit on side of mat for at least 10" with UE support prn with SBA to incr. independence with dressing, grooming,etc. at home  (09-30-14)   Status Achieved   PT LONG TERM GOAL #3   Title Sit unsupported on side of mat with 1 UE support and reach 4" anteriorly with other UE support with min assist to increase independence with ADL's with mod  (09-30-14)   Status Achieved   PT LONG TERM GOAL #4   Title Independent in updated HEP  (09-30-14)   Status Achieved   PT LONG TERM GOAL #5   Title Pt. will transfer wheelchair  to/from mat with sliding board with S only after set up for placement of board (11-04-14)   Baseline min assist needed for final part of transfer onto mat   Time 4   Period Weeks   Status On-going   PT LONG TERM GOAL #6   Title Pt. will roll from supine to R and L modified independently on mat and will report  ability to do so at home  (11-04-14)   Baseline min assist needed   Time 4   Period Weeks   Status On-going               Plan - 10/18/14 1428    Clinical Impression Statement Pt. improving with mobility - demonstrating ability to transfer from wheelchair toward L side today with only min assist needed at final part of transfer; pt also improving with rolling   Pt will benefit from skilled therapeutic intervention in order to improve on the following deficits Decreased mobility;Obesity;Decreased range of motion;Impaired flexibility;Increased muscle spasms;Decreased balance;Decreased strength   Rehab Potential Good   PT Frequency 2x / week   PT Duration 4 weeks   PT Treatment/Interventions ADLs/Self Care Home Management;Therapeutic activities;Patient/family education;Therapeutic exercise;Wheelchair mobility training;Balance training;Neuromuscular re-education;Functional mobility training   PT Next Visit Plan Bed mobility (rolling to R and L), rolling prone   PT Home Exercise Plan LE ROM and stretching - gave pt red therabnd and instructed to do hip abduction /adduction in w/c in seated position, strengthening HEP   Consulted and Agree with Plan of Care Patient        Problem List Patient Active Problem List   Diagnosis Date Noted  . 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 Dysuria 09/04/2014  . Hypertension 05/01/2014  . Ingrown right big toenail 05/01/2014  . Hypokalemia 05/01/2014  . GERD (gastroesophageal reflux disease) 02/14/2014  . Dysphagia, pharyngoesophageal phase 02/06/2014  . Type 2 diabetes mellitus without complication 94/17/4081  . Constipation   . Neurogenic  bowel   . 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 Metastatic cancer to spine 01/19/2014  . Rotator cuff tendonitis   . Acute postoperative respiratory failure 01/09/2014  . 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 Leg weakness, bilateral 12/13/2013    KGYJEH, UDJSH FWYOVZC, PT 10/18/2014, 2:32 PM  [facility] [address] [place], [place], [address] Phone: [phone]   Fax:  [phone]

## 2014-10-19 ENCOUNTER — Ambulatory Visit: Payer: BLUE CROSS/BLUE SHIELD

## 2014-10-23 ENCOUNTER — Ambulatory Visit: Payer: BLUE CROSS/BLUE SHIELD | Admitting: Physical Therapy

## 2014-10-24 ENCOUNTER — Ambulatory Visit: Payer: BLUE CROSS/BLUE SHIELD | Admitting: Physical Therapy

## 2014-10-24 ENCOUNTER — Ambulatory Visit: Payer: BLUE CROSS/BLUE SHIELD | Admitting: Occupational Therapy

## 2014-10-27 ENCOUNTER — Ambulatory Visit: Payer: BLUE CROSS/BLUE SHIELD | Attending: Family Medicine | Admitting: Occupational Therapy

## 2014-10-27 ENCOUNTER — Ambulatory Visit: Payer: BLUE CROSS/BLUE SHIELD

## 2014-10-27 ENCOUNTER — Encounter: Payer: Self-pay | Admitting: Occupational Therapy

## 2014-10-27 DIAGNOSIS — M25619 Stiffness of unspecified shoulder, not elsewhere classified: Secondary | ICD-10-CM | POA: Diagnosis present

## 2014-10-27 DIAGNOSIS — R29818 Other symptoms and signs involving the nervous system: Secondary | ICD-10-CM

## 2014-10-27 DIAGNOSIS — R2991 Unspecified symptoms and signs involving the musculoskeletal system: Secondary | ICD-10-CM | POA: Insufficient documentation

## 2014-10-27 DIAGNOSIS — M6281 Muscle weakness (generalized): Secondary | ICD-10-CM | POA: Diagnosis present

## 2014-10-27 DIAGNOSIS — G8220 Paraplegia, unspecified: Secondary | ICD-10-CM | POA: Insufficient documentation

## 2014-10-27 DIAGNOSIS — Z7409 Other reduced mobility: Secondary | ICD-10-CM | POA: Diagnosis present

## 2014-10-27 DIAGNOSIS — G839 Paralytic syndrome, unspecified: Secondary | ICD-10-CM | POA: Diagnosis present

## 2014-10-27 NOTE — Therapy (Signed)
[facility] [address] [place], [place], [address] Phone: [phone]   Fax:  [phone]  Occupational Therapy Treatment  Patient Details  Name: Sarah [name] MRN: [id number] Date of Birth: 1965-09-14 Referring Provider:  Elberta Leatherwood, MD  Encounter Date: 10/27/2014      OT End of Session - 10/27/14 1327    Visit Number 3   Number of Visits 16   Date for OT Re-Evaluation 12/13/14   Authorization Type BCBS primary (38 OT visits) / medicaid secondary (6 OT medicaid visits used)   Authorization Time Period 04/21/14-4/21/6 with MCD, (week 6/12)   OT Start Time 1030  pt arrived late   OT Stop Time 1100   OT Time Calculation (min) 30 min   Activity Tolerance Patient tolerated treatment well      Past Medical History  Diagnosis Date  . Hypertension   . DDD (degenerative disc disease), cervical   . DJD (degenerative joint disease)   . Obesity   . Chronic back pain   . 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 History of rectal fissure   . Rotator cuff tendonitis right    Past Surgical History  Procedure Laterality Date  . Tonsillectomy    . Laminectomy N/A 12/14/2013    Procedure: THORACIC LAMINECTOMY FOR TUMOR THORACIC THREE;  Surgeon: Ashok Pall, MD;  Location: [facility];  Service: Neurosurgery;  Laterality: N/A;  THORACIC LAMINECTOMY FOR TUMOR THORACIC THREE  . Posterior lumbar fusion 4 level N/A 12/30/2013    Procedure: Thoracic one-Thoracic five posterior thoracic fusion with pedicle screws;  Surgeon: Ashok Pall, MD;  Location: [facility];  Service: Neurosurgery;  Laterality: N/A;  Thoracic one-Thoracic five posterior thoracic fusion with pedicle screws  . Thyroidectomy N/A 01/09/2014    Procedure: TOTAL THYROIDECTOMY;  Surgeon: Izora Gala, MD;  Location: [facility];  Service: ENT;  Laterality: N/A;    There were no vitals filed for this visit.  Visit Diagnosis:   Muscle weakness (generalized)  Decreased functional mobility and endurance      Subjective Assessment - 10/27/14 1032    Subjective  I am feeling pretty good today   [name] accompained by: Family member   Pertinent History s/p laminectomy 12/14/13, s/p lumbar fusion 12/30/13 by Dr. Cyndy Freeze (secondary to tumor)   Patient Stated Goals improve ADLs (bathing, toileting)   Currently in Pain? Yes   Pain Score 2    Pain Location Leg   Pain Orientation Right;Left   Pain Descriptors / Indicators Pins and needles   Pain Type Chronic pain   Pain Onset More than a month ago   Pain Frequency Intermittent   Aggravating Factors  no specific factors   Pain Relieving Factors Tylenol                      OT Treatments/Exercises (OP) - 10/27/14 1321    ADLs   Functional Mobility Treatment focused on functional mobility with emphasis on sliding board transfers wheelchair to mat (pt required max a to place board and mod a for transfer).  Also addressed sitting balance with light challenge in all directions as well as push up using push up blocks to address ability to lift and clear bottom during transfers as well as to strengthen UE's and overall endurance. Pt required mod a to lift bottom 2 inches off surface. Pt fatigued at end of session.  OT Short Term Goals - 10/27/14 1325    OT SHORT TERM GOAL #1   Title Independent with updated HEP for UE strengthening--check STGs 11/12/14   Baseline dependent   Time 4   Period Weeks   Status On-going   OT SHORT TERM GOAL #2   Title Pt will peform UB bathing on edge of bed with min A.   Baseline mod A lying in bed   Status On-going   OT SHORT TERM GOAL #3   Title Pt to perform transfer to [facility] using sliding board with mod A.   Baseline not performing, using bed pan   Time 4   Status On-going   OT SHORT TERM GOAL #4   Title Pt will perform LB dressing with mod A using AE prn.   Baseline mod-max A for LB  dressing.   Time 4   Status On-going   OT SHORT TERM GOAL #5   Title Pt will verbalize understanding of appropriate community resources prn.   Baseline dependent   Time 4   Period Weeks   Status On-going           OT Long Term Goals - 10/27/14 1325    OT LONG TERM GOAL #1   Title Pt will perform UB/LB bathing mod I once transfer to tub bench is completed using AE/DME prn.   Baseline dependent LB, bathing in bed   Time 8   Period Weeks   Status On-going   OT LONG TERM GOAL #2   Title Pt will peform sliding board transfer to tub bench with min A.   Baseline sponge bathing in bed, not performin   Time 8   Period Weeks   Status On-going   OT LONG TERM GOAL #3   Title Pt to perform transfer to [facility] using sliding board with min A.   Baseline not peforming, using bed pan   Time 8   Period Weeks   Status On-going   OT LONG TERM GOAL #4   Title Pt will perform toileting hygiene independently after bowel movement using BSC.   Baseline dependent   Time 8   Period Weeks   Status On-going               Plan - 10/27/14 1325    Clinical Impression Statement Pt making slow progress toward goals. Pt with bright affect and clearly motivated to participate. Pt limited by endurance and fear of movement.   Pt will benefit from skilled therapeutic intervention in order to improve on the following deficits (Retired) Decreased knowledge of use of DME;Impaired flexibility;Decreased coordination;Decreased mobility;Improper body mechanics;Decreased endurance;Decreased activity tolerance;Decreased range of motion;Decreased strength;Other (comment);Impaired UE functional use;Decreased balance   Rehab Potential Good   Clinical Impairments Affecting Rehab Potential severity of deficits   OT Frequency 2x / week   OT Duration 8 weeks   OT Treatment/Interventions Self-care/ADL training;Neuromuscular education;Therapeutic exercise;Patient/family education;DME and/or AE instruction;Passive range  of motion;Manual Therapy;Therapeutic activities;Cryotherapy;Moist Heat;Energy conservation;Therapist, nutritional;Therapeutic exercises;Ultrasound   Plan UE HEP, sitting balance, endurance, trunk control, begin working on ADL's   OT Home Exercise Plan Ongoing   Consulted and Agree with Plan of Care Family member/caregiver;Patient        Problem List Patient Active Problem List   Diagnosis Date Noted  . 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 Dysuria 09/04/2014  . Hypertension 05/01/2014  . Ingrown right big toenail 05/01/2014  . Hypokalemia 05/01/2014  . GERD (gastroesophageal reflux disease) 02/14/2014  . Dysphagia, pharyngoesophageal phase  02/06/2014  . Type 2 diabetes mellitus without complication 78/24/2353  . Constipation   . Neurogenic bowel   . 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 Metastatic cancer to spine 01/19/2014  . Rotator cuff tendonitis   . Acute postoperative respiratory failure 01/09/2014  . 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 Leg weakness, bilateral 12/13/2013    Quay Burow, OTR/L 10/27/2014, 1:28 PM  [facility] [address] [place], [place], [address] Phone: [phone]   Fax:  [phone]

## 2014-10-27 NOTE — Therapy (Signed)
[facility] [address] [place], [place], [address] Phone: [phone]   Fax:  [phone]  Physical Therapy Treatment  Patient Details  Name: Sarah [name] MRN: [id number] Date of Birth: Jun 04, 1965 Referring Provider:  Elberta Leatherwood, MD  Encounter Date: 10/27/2014      PT End of Session - 10/27/14 1239    Visit Number 23   Number of Visits 27   Date for PT Re-Evaluation 11/04/14   Authorization Type BCBS   Authorization Time Period 03-24-14  - 03-24-15   Authorization - Visit Number 18   Authorization - Number of Visits 30   PT Start Time 1103   PT Stop Time 1144   PT Time Calculation (min) 41 min   Equipment Utilized During Treatment Gait belt   Activity Tolerance Patient tolerated treatment well   Behavior During Therapy WFL for tasks assessed/performed      Past Medical History  Diagnosis Date  . Hypertension   . DDD (degenerative disc disease), cervical   . DJD (degenerative joint disease)   . Obesity   . Chronic back pain   . 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 History of rectal fissure   . Rotator cuff tendonitis right    Past Surgical History  Procedure Laterality Date  . Tonsillectomy    . Laminectomy N/A 12/14/2013    Procedure: THORACIC LAMINECTOMY FOR TUMOR THORACIC THREE;  Surgeon: Ashok Pall, MD;  Location: [facility];  Service: Neurosurgery;  Laterality: N/A;  THORACIC LAMINECTOMY FOR TUMOR THORACIC THREE  . Posterior lumbar fusion 4 level N/A 12/30/2013    Procedure: Thoracic one-Thoracic five posterior thoracic fusion with pedicle screws;  Surgeon: Ashok Pall, MD;  Location: [facility];  Service: Neurosurgery;  Laterality: N/A;  Thoracic one-Thoracic five posterior thoracic fusion with pedicle screws  . Thyroidectomy N/A 01/09/2014    Procedure: TOTAL THYROIDECTOMY;  Surgeon: Izora Gala, MD;  Location: [facility];  Service: ENT;  Laterality: N/A;     There were no vitals filed for this visit.  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   [name] accompained by: Family member   Patient Stated Goals Improve mobility   Currently in Pain? Yes   Pain Score 2    Pain Location Back   Pain Orientation Mid   Pain Descriptors / Indicators Other (Comment)  pins and needles   Pain Type Chronic pain   Pain Radiating Towards B LEs   Pain Onset More than a month ago   Pain Frequency Constant   Aggravating Factors  no specific factors   Pain Relieving Factors Tylenol                         [facility] Adult PT Treatment/Exercise - 10/27/14 1105    Bed Mobility   Bed Mobility Rolling Right;Rolling Left  rolling prone   Rolling Right 4: Min assist;3: Mod assist   Rolling Right Details (indicate cue type and reason) Min A with legs crossed and mod A with L LE in flexion vs. crossed. VC's to improve UE swing/momentum in order to roll. Pt MOD I rolling from R side to supine. Pt performed with 3" wedge under  torso and hips to initiate roll.   Rolling Left 4: Min assist;3: Mod assist;4: Min guard   Rolling Left Details (indicate cue type and reason) With and without 3" wedge placed under pt's torso and hips to initiate rolling. Performed x15 with legs crossed and with R knee in flexion. Pt progressed to supervision with wegde placed under pt. Mod A required when wedge not present.   Supine to Sit 3: Mod assist   Supine to Sit Details (indicate cue type and reason) Pt required assist to guide B LEs of EOB and cues to use UEs to guide trunk into seated position.   Sit to Supine 3: Mod assist   Sit to Supine - Details (indicate cue type and reason) Assist to guide B LEs onto mat. Pt demonstrated good trunk control.   Transfers    Transfers Lateral/Scoot Transfers   Lateral/Scoot Transfers 4: Min guard  min guard after placing slide board.   Lateral/Scoot Transfer Details (indicate cue type and reason) Slide board transfer from mat to w/c. Cues for head/hips relationship. Pt required increased time due to fear of falling. Pt also attempted lateral scoots along mat with 4" and 6" blocks to improve UE assist, cues to improve ant.weight shifting.                  PT Short Term Goals - 10/05/14 0557    PT SHORT TERM GOAL #1   Title Pt. will transfer with sliding board wheelchair to mat with +1 max assist.  (target date 08-31-14)   Status Achieved   PT SHORT TERM GOAL #2   Title Sit unsupported on side of mat x 5" with +2 mod assist   Status Achieved   PT SHORT TERM GOAL #3   Title Independent to direct HEP (stretches and PROM for bil. LE's)   Status Achieved   PT SHORT TERM GOAL #4   Title Tolerate standing in standing frame x 10" for LE stretching and weight-bearing   (08-31-14)   Status Achieved   PT SHORT TERM GOAL #5   Title Incr. endurance/activity tolerance so pt. able to perform 10' nonstop on recumbent bike (Scifit)  (08-31-14)   Status On-going           PT Long Term Goals - 10/10/14 1350    PT LONG TERM GOAL #1   Title Transfer with sliding board with +1 mod assist wheelchair to/from mat   Status Achieved   PT LONG TERM GOAL #2   Title Sit on side of mat for at least 10" with UE support prn with SBA to incr. independence with dressing, grooming,etc. at home  (09-30-14)   Status Achieved   PT LONG TERM GOAL #3   Title Sit unsupported on side of mat with 1 UE support and reach 4" anteriorly with other UE support with min assist to increase independence with ADL's with mod  (09-30-14)   Status Achieved   PT LONG TERM GOAL #4   Title Independent in updated HEP  (09-30-14)   Status Achieved   PT LONG TERM GOAL #5   Title Pt. will transfer wheelchair to/from mat with sliding board with S only after set  up for placement of board (11-04-14)   Baseline min assist needed for final part of transfer onto mat   Time 4   Period Weeks   Status On-going   PT LONG TERM GOAL #6   Title Pt. will roll from supine to R and L modified  independently on mat and will report ability to do so at home  (11-04-14)   Baseline min assist needed   Time 4   Period Weeks   Status On-going               Plan - 10/27/14 1241    Clinical Impression Statement Pt demonstrated progress, as she progressed to supervision when rolling to L side with 3" wedge placed under torso. Pt continues to require frequent rest breaks during session due fatigue. Continue with POC.   Pt will benefit from skilled therapeutic intervention in order to improve on the following deficits Decreased mobility;Obesity;Decreased range of motion;Impaired flexibility;Increased muscle spasms;Decreased balance;Decreased strength   Rehab Potential Good   PT Frequency 2x / week   PT Duration 4 weeks   PT Treatment/Interventions ADLs/Self Care Home Management;Therapeutic activities;Patient/family education;Therapeutic exercise;Wheelchair mobility training;Balance training;Neuromuscular re-education;Functional mobility training   PT Next Visit Plan Continue to perform rolling to R and L side, and roll prone.   PT Home Exercise Plan LE ROM and stretching - gave pt red therabnd and instructed to do hip abduction /adduction in w/c in seated position, strengthening HEP   Consulted and Agree with Plan of Care Patient;Family member/caregiver   Family Member Consulted pt's sister-[name]        Problem List Patient Active Problem List   Diagnosis Date Noted  . 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 Dysuria 09/04/2014  . Hypertension 05/01/2014  . Ingrown right big toenail 05/01/2014  . Hypokalemia 05/01/2014  . GERD (gastroesophageal reflux disease) 02/14/2014  . Dysphagia, pharyngoesophageal phase 02/06/2014  . Type 2 diabetes mellitus without  complication 83/29/1916  . Constipation   . Neurogenic bowel   . 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 Metastatic cancer to spine 01/19/2014  . Rotator cuff tendonitis   . Acute postoperative respiratory failure 01/09/2014  . 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 Leg weakness, bilateral 12/13/2013    Zanita Millman L 10/27/2014, 12:45 PM  [facility] [address] [place], [place], [address] Phone: [phone]   Fax:  [phone]     Geoffry Paradise, PT,DPT 10/27/2014 12:45 PM Phone: [phone] Fax: [phone]

## 2014-10-30 ENCOUNTER — Ambulatory Visit: Payer: BLUE CROSS/BLUE SHIELD | Admitting: Physical Therapy

## 2014-10-31 ENCOUNTER — Ambulatory Visit: Payer: BLUE CROSS/BLUE SHIELD | Admitting: Physical Therapy

## 2014-10-31 DIAGNOSIS — M6281 Muscle weakness (generalized): Secondary | ICD-10-CM | POA: Diagnosis not present

## 2014-10-31 DIAGNOSIS — Z7409 Other reduced mobility: Secondary | ICD-10-CM

## 2014-10-31 NOTE — Therapy (Signed)
[facility] [address] [place], [place], [address] Phone: [phone]   Fax:  [phone]  Physical Therapy Treatment  Patient Details  Name: Sarah [name] MRN: [id number] Date of Birth: 1966-01-27 Referring Provider:  Elberta Leatherwood, MD  Encounter Date: 10/31/2014      PT End of Session - 10/31/14 1502    Visit Number 24   Number of Visits 27   Date for PT Re-Evaluation 11/04/14   Authorization Type BCBS   Authorization Time Period 03-24-14  - 03-24-15   Authorization - Visit Number 20   Authorization - Number of Visits 30   PT Start Time 0934   PT Stop Time 1016   PT Time Calculation (min) 42 min   Activity Tolerance Patient tolerated treatment well   Behavior During Therapy [facility] for tasks assessed/performed      Past Medical History  Diagnosis Date  . Hypertension   . DDD (degenerative disc disease), cervical   . DJD (degenerative joint disease)   . Obesity   . Chronic back pain   . 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 History of rectal fissure   . Rotator cuff tendonitis right    Past Surgical History  Procedure Laterality Date  . Tonsillectomy    . Laminectomy N/A 12/14/2013    Procedure: THORACIC LAMINECTOMY FOR TUMOR THORACIC THREE;  Surgeon: Ashok Pall, MD;  Location: [place] [facility];  Service: Neurosurgery;  Laterality: N/A;  THORACIC LAMINECTOMY FOR TUMOR THORACIC THREE  . Posterior lumbar fusion 4 level N/A 12/30/2013    Procedure: Thoracic one-Thoracic five posterior thoracic fusion with pedicle screws;  Surgeon: Ashok Pall, MD;  Location: [place] [facility];  Service: Neurosurgery;  Laterality: N/A;  Thoracic one-Thoracic five posterior thoracic fusion with pedicle screws  . Thyroidectomy N/A 01/09/2014    Procedure: TOTAL THYROIDECTOMY;  Surgeon: Izora Gala, MD;  Location: [facility];  Service: ENT;  Laterality: N/A;    There were no vitals filed for this  visit.  Visit Diagnosis:  Decreased functional mobility and endurance      Subjective Assessment - 10/31/14 1500    Subjective Denies falls or changes.  Reports bed mobility improving at home and sister agrees and states she has been working hard at home.   [name] accompained by: Family member  sister   Patient Stated Goals Improve mobility   Currently in Pain? No/denies                         [facility] Adult PT Treatment/Exercise - 10/31/14 1505    Bed Mobility   Bed Mobility Rolling Right;Rolling Left   Rolling Right 4: Min assist   Rolling Right Details (indicate cue type and reason) Started with 3" wedge under pt then progress to flat;Cues for momentum and armswing;needed min assist to position LE's in prep for rolling;has legloops at home   Rolling Left 4: Min assist   Rolling Left Details (indicate cue type and reason) same as to R   Supine to Sit 3: Mod assist   Supine to Sit Details (indicate cue type and reason) assist for LE's   Sit to Supine 3: Mod assist   Sit to Supine - Details (indicate cue type and reason) assist for LE's   Transfers   Transfers Lateral/Scoot Transfers   Lateral/Scoot Transfers 4: Min guard   Lateral/Scoot Transfer Details (indicate cue type and reason)  slide board transfer to/from mat with min assist to place board and position LE's otherwise min guard assist.     Comments Worked on moving from supine into long sitting position with mod assist and max verbal cues.  Pt needed min-min guard assist once in long sitting and to position LE's.  Sat in both long sitting and circle sitting and held these positions for stretching to hamstrings, trunk and low back.  Pt able to tolerate >4 minutes each time in this position.   Balance   Balance Assessed Yes   Static Sitting Balance   Static Sitting - Balance Support No upper extremity supported   Static Sitting - Level of Assistance 5: Stand by assistance                  PT  Short Term Goals - 10/05/14 0557    PT SHORT TERM GOAL #1   Title Pt. will transfer with sliding board wheelchair to mat with +1 max assist.  (target date 08-31-14)   Status Achieved   PT SHORT TERM GOAL #2   Title Sit unsupported on side of mat x 5" with +2 mod assist   Status Achieved   PT SHORT TERM GOAL #3   Title Independent to direct HEP (stretches and PROM for bil. LE's)   Status Achieved   PT SHORT TERM GOAL #4   Title Tolerate standing in standing frame x 10" for LE stretching and weight-bearing   (08-31-14)   Status Achieved   PT SHORT TERM GOAL #5   Title Incr. endurance/activity tolerance so pt. able to perform 10' nonstop on recumbent bike (Scifit)  (08-31-14)   Status On-going           PT Long Term Goals - 10/10/14 1350    PT LONG TERM GOAL #1   Title Transfer with sliding board with +1 mod assist wheelchair to/from mat   Status Achieved   PT LONG TERM GOAL #2   Title Sit on side of mat for at least 10" with UE support prn with SBA to incr. independence with dressing, grooming,etc. at home  (09-30-14)   Status Achieved   PT LONG TERM GOAL #3   Title Sit unsupported on side of mat with 1 UE support and reach 4" anteriorly with other UE support with min assist to increase independence with ADL's with mod  (09-30-14)   Status Achieved   PT LONG TERM GOAL #4   Title Independent in updated HEP  (09-30-14)   Status Achieved   PT LONG TERM GOAL #5   Title Pt. will transfer wheelchair to/from mat with sliding board with S only after set up for placement of board (11-04-14)   Baseline min assist needed for final part of transfer onto mat   Time 4   Period Weeks   Status On-going   PT LONG TERM GOAL #6   Title Pt. will roll from supine to R and L modified independently on mat and will report ability to do so at home  (11-04-14)   Baseline min assist needed   Time 4   Period Weeks   Status On-going               Plan - 10/31/14 1502    Clinical Impression Statement Pt  with improved progress with transfers and bed mobility today in addition to tolerating long sitting and being able to assist getting in this position.  Improved confidence today as well.  Continue PT  per POC.   Pt will benefit from skilled therapeutic intervention in order to improve on the following deficits Decreased mobility;Obesity;Decreased range of motion;Impaired flexibility;Increased muscle spasms;Decreased balance;Decreased strength   Rehab Potential Good   PT Frequency 2x / week   PT Duration 4 weeks   PT Treatment/Interventions ADLs/Self Care Home Management;Therapeutic activities;Patient/family education;Therapeutic exercise;Wheelchair mobility training;Balance training;Neuromuscular re-education;Functional mobility training   PT Next Visit Plan Continue to perform rolling to R and L side, supine<>long sitting and roll prone.   PT Home Exercise Plan LE ROM and stretching - gave pt red therabnd and instructed to do hip abduction /adduction in w/c in seated position, strengthening HEP   Consulted and Agree with Plan of Care Patient;Family member/caregiver   Family Member Consulted pt's sister-[name]        Problem List Patient Active Problem List   Diagnosis Date Noted  . 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 Dysuria 09/04/2014  . Hypertension 05/01/2014  . Ingrown right big toenail 05/01/2014  . Hypokalemia 05/01/2014  . GERD (gastroesophageal reflux disease) 02/14/2014  . Dysphagia, pharyngoesophageal phase 02/06/2014  . Type 2 diabetes mellitus without complication 11/91/4782  . Constipation   . Neurogenic bowel   . 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 Metastatic cancer to spine 01/19/2014  . Rotator cuff tendonitis   . Acute postoperative respiratory failure 01/09/2014  . 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 Leg weakness, bilateral 12/13/2013    Narda Bonds 10/31/2014, 3:12 PM  [facility] [address] [place], [place], [address] Phone: [phone]   Fax:  [place], [place] 10/31/2014 3:12 PM Phone: [phone] Fax: [phone]

## 2014-11-03 ENCOUNTER — Encounter: Payer: Self-pay | Admitting: Occupational Therapy

## 2014-11-03 ENCOUNTER — Ambulatory Visit: Payer: BLUE CROSS/BLUE SHIELD | Admitting: Physical Therapy

## 2014-11-03 ENCOUNTER — Ambulatory Visit: Payer: BLUE CROSS/BLUE SHIELD | Admitting: Occupational Therapy

## 2014-11-03 DIAGNOSIS — R29818 Other symptoms and signs involving the nervous system: Secondary | ICD-10-CM

## 2014-11-03 DIAGNOSIS — Z7409 Other reduced mobility: Secondary | ICD-10-CM

## 2014-11-03 DIAGNOSIS — G8220 Paraplegia, unspecified: Secondary | ICD-10-CM

## 2014-11-03 DIAGNOSIS — M6281 Muscle weakness (generalized): Secondary | ICD-10-CM

## 2014-11-03 DIAGNOSIS — R2991 Unspecified symptoms and signs involving the musculoskeletal system: Secondary | ICD-10-CM

## 2014-11-03 DIAGNOSIS — M25619 Stiffness of unspecified shoulder, not elsewhere classified: Secondary | ICD-10-CM

## 2014-11-03 NOTE — Therapy (Signed)
[facility] [address] [place], [place], [address] Phone: [phone]   Fax:  [phone]  Occupational Therapy Treatment  Patient Details  Name: Sarah [name] MRN: [id number] Date of Birth: 08/30/1965 Referring Provider:  Elberta Leatherwood, MD  Encounter Date: 11/03/2014      OT End of Session - 11/03/14 1538    Visit Number 4   Number of Visits 16   Date for OT Re-Evaluation 12/13/14   Authorization Type BCBS primary (32 OT visits) / medicaid secondary (6 OT medicaid visits used)   OT Start Time 1445   OT Stop Time 1528   OT Time Calculation (min) 43 min   Equipment Utilized During Treatment blue theraband, slide board, drop arm commode   Activity Tolerance Patient tolerated treatment well   Behavior During Therapy [facility] for tasks assessed/performed      Past Medical History  Diagnosis Date  . Hypertension   . DDD (degenerative disc disease), cervical   . DJD (degenerative joint disease)   . Obesity   . Chronic back pain   . 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 History of rectal fissure   . Rotator cuff tendonitis right    Past Surgical History  Procedure Laterality Date  . Tonsillectomy    . Laminectomy N/A 12/14/2013    Procedure: THORACIC LAMINECTOMY FOR TUMOR THORACIC THREE;  Surgeon: Ashok Pall, MD;  Location: [place] [facility];  Service: Neurosurgery;  Laterality: N/A;  THORACIC LAMINECTOMY FOR TUMOR THORACIC THREE  . Posterior lumbar fusion 4 level N/A 12/30/2013    Procedure: Thoracic one-Thoracic five posterior thoracic fusion with pedicle screws;  Surgeon: Ashok Pall, MD;  Location: [place] [facility];  Service: Neurosurgery;  Laterality: N/A;  Thoracic one-Thoracic five posterior thoracic fusion with pedicle screws  . Thyroidectomy N/A 01/09/2014    Procedure: TOTAL THYROIDECTOMY;  Surgeon: Izora Gala, MD;  Location: [facility];  Service: ENT;  Laterality: N/A;    There  were no vitals filed for this visit.  Visit Diagnosis:  Poor trunk control  Generalized muscle weakness  Paraplegia  Decreased functional mobility and endurance  Stiffness of joint, shoulder region, unspecified laterality      Subjective Assessment - 11/03/14 1530    Subjective  I do the blue theraband   [name] accompained by: Family member   Pertinent History s/p laminectomy 12/14/13, s/p lumbar fusion 12/30/13 by Dr. Cyndy Freeze (secondary to tumor)   Patient Stated Goals improve ADLs (bathing, toileting)                      OT Treatments/Exercises (OP) - 11/03/14 0001    ADLs   Toileting Practiced level surface transfer from mat table to drop arm commode.  Patient initially attempted with sliding board, but this was not successful as slide board unstable on firm commode surface.  Patient able to transfer onto and off of drop arm commode without the slide board with minimal assistance and moderate verbal cueing.  Patient and sister both indicate that [facility] at home is not as wide,and they were encouraged to bring actual commode to therapy to allow patient to practice with her own equipment to reduce anxiety, and to problem solve any potential difficulties.   Bathing Reviewed goal of bathing partially at edge of bed.  Patient hesitant to free both arms from support while seated, and encouraged patient to work on this at home, eg using  both hands simultaneously to wash face.     Shoulder Exercises: Seated   Horizontal ABduction AROM;Strengthening;Both;15 reps;Theraband   Theraband Level (Shoulder Horizontal ABduction) Level 4 (Blue)   Abduction AROM;Strengthening;Both;15 reps;Theraband   Theraband Level (Shoulder ABduction) Level 4 (Blue)   Neurological Re-education Exercises   Other Exercises 1 Short unsupported sitting balance at edge of mat without UE support - using UE's as counterbalance.  Patient initally nervous with all new ranges of trunk motion, but able to follow  concepts for arms offsetting body weight during weight shifts.                  OT Education - 11/03/14 1537    Education provided Yes   Education Details Reviewed prior HEP - Blue theraband   [employer]) Educated Patient;Other (comment)  sister   Methods Explanation;Demonstration   Comprehension Verbalized understanding;Returned demonstration          OT Short Term Goals - 10/27/14 1325    OT SHORT TERM GOAL #1   Title Independent with updated HEP for UE strengthening--check STGs 11/12/14   Baseline dependent   Time 4   Period Weeks   Status On-going   OT SHORT TERM GOAL #2   Title Pt will peform UB bathing on edge of bed with min A.   Baseline mod A lying in bed   Status On-going   OT SHORT TERM GOAL #3   Title Pt to perform transfer to [facility] using sliding board with mod A.   Baseline not performing, using bed pan   Time 4   Status On-going   OT SHORT TERM GOAL #4   Title Pt will perform LB dressing with mod A using AE prn.   Baseline mod-max A for LB dressing.   Time 4   Status On-going   OT SHORT TERM GOAL #5   Title Pt will verbalize understanding of appropriate community resources prn.   Baseline dependent   Time 4   Period Weeks   Status On-going           OT Long Term Goals - 10/27/14 1325    OT LONG TERM GOAL #1   Title Pt will perform UB/LB bathing mod I once transfer to tub bench is completed using AE/DME prn.   Baseline dependent LB, bathing in bed   Time 8   Period Weeks   Status On-going   OT LONG TERM GOAL #2   Title Pt will peform sliding board transfer to tub bench with min A.   Baseline sponge bathing in bed, not performin   Time 8   Period Weeks   Status On-going   OT LONG TERM GOAL #3   Title Pt to perform transfer to [facility] using sliding board with min A.   Baseline not peforming, using bed pan   Time 8   Period Weeks   Status On-going   OT LONG TERM GOAL #4   Title Pt will perform toileting hygiene independently after bowel  movement using BSC.   Baseline dependent   Time 8   Period Weeks   Status On-going               Plan - 11/03/14 1539    Clinical Impression Statement Patient showing steady improvement and seems motivated for progress toward her OT goals.  Patient anxious with new situations, and does best with confident encouragement.  Patient with beginnings of active leg movement which is helping with functional transfers   Pt  will benefit from skilled therapeutic intervention in order to improve on the following deficits (Retired) Decreased knowledge of use of DME;Impaired flexibility;Decreased coordination;Decreased mobility;Improper body mechanics;Decreased endurance;Decreased activity tolerance;Decreased range of motion;Decreased strength;Other (comment);Impaired UE functional use;Decreased balance   Rehab Potential Good   Clinical Impairments Affecting Rehab Potential severity of deficits   OT Frequency 2x / week   OT Duration 8 weeks   OT Treatment/Interventions Self-care/ADL training;Neuromuscular education;Therapeutic exercise;Patient/family education;DME and/or AE instruction;Passive range of motion;Manual Therapy;Therapeutic activities;Cryotherapy;Moist Heat;Energy conservation;Therapist, nutritional;Therapeutic exercises;Ultrasound   Plan Add to HEP - Upright row, shoulder stabilization, wheelchair push ups, theraputty (per her request) and BSC transfers - she may bring in her own commode   OT Home Exercise Plan Ongoing   Consulted and Agree with Plan of Care Family member/caregiver;Patient   Family Member Consulted sister        Problem List Patient Active Problem List   Diagnosis Date Noted  . 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 Dysuria 09/04/2014  . Hypertension 05/01/2014  . Ingrown right big toenail 05/01/2014  . Hypokalemia 05/01/2014  . GERD (gastroesophageal reflux disease) 02/14/2014  . Dysphagia, pharyngoesophageal phase 02/06/2014  . Type 2 diabetes mellitus  without complication 09/98/3382  . Constipation   . Neurogenic bowel   . 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 Metastatic cancer to spine 01/19/2014  . Rotator cuff tendonitis   . Acute postoperative respiratory failure 01/09/2014  . 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 Leg weakness, bilateral 12/13/2013    Mariah Milling, OTR/L 11/03/2014, 3:43 PM  [facility] [address] [place], [place], [address] Phone: [phone]   Fax:  [phone]

## 2014-11-04 ENCOUNTER — Encounter: Payer: Self-pay | Admitting: Physical Therapy

## 2014-11-04 NOTE — Therapy (Signed)
[facility] [address] [place], [place], [address] Phone: [phone]   Fax:  [phone]  Physical Therapy Treatment  Patient Details  Name: Sarah [name] MRN: [id number] Date of Birth: [birth year] Referring Provider:  Elberta Leatherwood, MD  Encounter Date: 11/03/2014      PT End of Session - 11/04/14 2011    Visit Number 25   Number of Visits 27   Date for PT Re-Evaluation 11/04/14   Authorization Type BCBS   Authorization Time Period 03-24-14  - 03-24-15   Authorization - Visit Number 20   Authorization - Number of Visits 30   PT Start Time 1410   PT Stop Time 4401   PT Time Calculation (min) 35 min      Past Medical History  Diagnosis Date  . Hypertension   . DDD (degenerative disc disease), cervical   . DJD (degenerative joint disease)   . Obesity   . Chronic back pain   . 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 History of rectal fissure   . Rotator cuff tendonitis right    Past Surgical History  Procedure Laterality Date  . Tonsillectomy    . Laminectomy N/A 12/14/2013    Procedure: THORACIC LAMINECTOMY FOR TUMOR THORACIC THREE;  Surgeon: Sarah Pall, MD;  Location: [facility];  Service: Neurosurgery;  Laterality: N/A;  THORACIC LAMINECTOMY FOR TUMOR THORACIC THREE  . Posterior lumbar fusion 4 level N/A 12/30/2013    Procedure: Thoracic one-Thoracic five posterior thoracic fusion with pedicle screws;  Surgeon: Sarah Pall, MD;  Location: [facility];  Service: Neurosurgery;  Laterality: N/A;  Thoracic one-Thoracic five posterior thoracic fusion with pedicle screws  . Thyroidectomy N/A 01/09/2014    Procedure: TOTAL THYROIDECTOMY;  Surgeon: Sarah Gala, MD;  Location: [facility];  Service: ENT;  Laterality: N/A;    There were no vitals filed for this visit.  Visit Diagnosis:  Generalized muscle weakness  Paraplegia  Decreased functional mobility and endurance       Subjective Assessment - 11/04/14 2008    Subjective Pt reports transfers are getting easier at home; "I'm moving my legs more"   [name] accompained by: Family member  sister - Sarah [name]   Patient Stated Goals Improve mobility   Currently in Pain? No/denies                         [facility] Adult PT Treatment/Exercise - 11/04/14 0001    Bed Mobility   Sit to Supine 3: Mod assist  with LE's   Transfers   Transfers Lateral/Scoot Transfers   Lateral/Scoot Transfers 5: Supervision   Lateral/Scoot Transfer Details (indicate cue type and reason) pt able to weight shift and remove sliding board with cues      TherEx:  Bridging 10 reps; SLR assisted RLE and LLE 10 reps each; heel slides RLE and LLE 10 reps each;  SAQ  10 reps RLE and LLE:  Hip abduction and adduction 10 reps each with manual moderate resistance Instructed in LAQ with yellow theraband while seated in wheelchair            PT Short Term Goals - 10/05/14 0557    PT SHORT TERM GOAL #1   Title Pt. will transfer with sliding board wheelchair to mat with +1 max assist.  (target date 08-31-14)   Status Achieved   PT SHORT TERM GOAL #2  Title Sit unsupported on side of mat x 5" with +2 mod assist   Status Achieved   PT SHORT TERM GOAL #3   Title Independent to direct HEP (stretches and PROM for bil. LE's)   Status Achieved   PT SHORT TERM GOAL #4   Title Tolerate standing in standing frame x 10" for LE stretching and weight-bearing   (08-31-14)   Status Achieved   PT SHORT TERM GOAL #5   Title Incr. endurance/activity tolerance so pt. able to perform 10' nonstop on recumbent bike (Scifit)  (08-31-14)   Status On-going           PT Long Term Goals - 10/10/14 1350    PT LONG TERM GOAL #1   Title Transfer with sliding board with +1 mod assist wheelchair to/from mat   Status Achieved   PT LONG TERM GOAL #2   Title Sit on side of mat for at least 10" with UE support prn with SBA to incr. independence  with dressing, grooming,etc. at home  (09-30-14)   Status Achieved   PT LONG TERM GOAL #3   Title Sit unsupported on side of mat with 1 UE support and reach 4" anteriorly with other UE support with min assist to increase independence with ADL's with mod  (09-30-14)   Status Achieved   PT LONG TERM GOAL #4   Title Independent in updated HEP  (09-30-14)   Status Achieved   PT LONG TERM GOAL #5   Title Pt. will transfer wheelchair to/from mat with sliding board with S only after set up for placement of board (11-04-14)   Baseline min assist needed for final part of transfer onto mat   Time 4   Period Weeks   Status On-going   PT LONG TERM GOAL #6   Title Pt. will roll from supine to R and L modified independently on mat and will report ability to do so at home  (11-04-14)   Baseline min assist needed   Time 4   Period Weeks   Status On-going               Plan - 11/04/14 2013    Clinical Impression Statement Pt. increasing strength and AROM in bil. LE's with more spasms noted in RLE than in LLE; perfomed sliding board transfer without assist. for 1st time today   Pt will benefit from skilled therapeutic intervention in order to improve on the following deficits Decreased mobility;Obesity;Decreased range of motion;Impaired flexibility;Increased muscle spasms;Decreased balance;Decreased strength   Rehab Potential Good   PT Frequency 2x / week   PT Duration 4 weeks   PT Treatment/Interventions ADLs/Self Care Home Management;Therapeutic activities;Patient/family education;Therapeutic exercise;Wheelchair mobility training;Balance training;Neuromuscular re-education;Functional mobility training   PT Next Visit Plan cont ther ex   PT Home Exercise Plan added yellow theraband for LAQ's while seated in wheelchair   Consulted and Agree with Plan of Care Patient;Family member/caregiver   Family Member Consulted pt's sister-Sarah [name]        Problem List Patient Active Problem List   Diagnosis  Date Noted  . 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 Dysuria 09/04/2014  . Hypertension 05/01/2014  . Ingrown right big toenail 05/01/2014  . Hypokalemia 05/01/2014  . GERD (gastroesophageal reflux disease) 02/14/2014  . Dysphagia, pharyngoesophageal phase 02/06/2014  . Type 2 diabetes mellitus without complication 37/90/2409  . Constipation   . Neurogenic bowel   . 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 Metastatic cancer to spine 01/19/2014  . Rotator  cuff tendonitis   . Acute postoperative respiratory failure 01/09/2014  . 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 Leg weakness, bilateral 12/13/2013    Alda Lea, PT 09/11/202016, 8:23 PM  [facility] [address] [place], [place], [address] Phone: [phone]   Fax:  [phone]

## 2014-11-06 ENCOUNTER — Ambulatory Visit: Payer: BLUE CROSS/BLUE SHIELD | Admitting: Physical Therapy

## 2014-11-06 ENCOUNTER — Encounter: Payer: Self-pay | Admitting: Occupational Therapy

## 2014-11-06 ENCOUNTER — Ambulatory Visit: Payer: BLUE CROSS/BLUE SHIELD | Admitting: Occupational Therapy

## 2014-11-06 DIAGNOSIS — M6281 Muscle weakness (generalized): Secondary | ICD-10-CM

## 2014-11-06 DIAGNOSIS — Z7409 Other reduced mobility: Secondary | ICD-10-CM

## 2014-11-06 DIAGNOSIS — G8220 Paraplegia, unspecified: Secondary | ICD-10-CM

## 2014-11-06 DIAGNOSIS — R29818 Other symptoms and signs involving the nervous system: Secondary | ICD-10-CM

## 2014-11-06 DIAGNOSIS — R2991 Unspecified symptoms and signs involving the musculoskeletal system: Secondary | ICD-10-CM

## 2014-11-06 NOTE — Therapy (Signed)
[facility] [address] [place], [place], [address] Phone: [phone]   Fax:  [phone]  Occupational Therapy Treatment  Patient Details  Name: Sarah [name] MRN: [id number] Date of Birth: [birth year] Referring Provider:  Elberta Leatherwood, MD  Encounter Date: 11/06/2014      OT End of Session - 11/06/14 1319    Visit Number 5   Number of Visits 16   Date for OT Re-Evaluation 12/13/14   Authorization Type BCBS primary (35 OT visits) / medicaid secondary (6 OT medicaid visits used, 3 previous BCBS visits used)   Authorization Time Period wk 4/8   Authorization - Visit Number 3   Authorization - Number of Visits 30   OT Start Time 1150   OT Stop Time 1232   OT Time Calculation (min) 42 min   Equipment Utilized During Treatment --   Activity Tolerance Patient tolerated treatment well   Behavior During Therapy [facility] for tasks assessed/performed      Past Medical History  Diagnosis Date  . Hypertension   . DDD (degenerative disc disease), cervical   . DJD (degenerative joint disease)   . Obesity   . Chronic back pain   . 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 History of rectal fissure   . Rotator cuff tendonitis right    Past Surgical History  Procedure Laterality Date  . Tonsillectomy    . Laminectomy N/A 12/14/2013    Procedure: THORACIC LAMINECTOMY FOR TUMOR THORACIC THREE;  Surgeon: Ashok Pall, MD;  Location: [facility];  Service: Neurosurgery;  Laterality: N/A;  THORACIC LAMINECTOMY FOR TUMOR THORACIC THREE  . Posterior lumbar fusion 4 level N/A 12/30/2013    Procedure: Thoracic one-Thoracic five posterior thoracic fusion with pedicle screws;  Surgeon: Ashok Pall, MD;  Location: [place] [facility];  Service: Neurosurgery;  Laterality: N/A;  Thoracic one-Thoracic five posterior thoracic fusion with pedicle screws  . Thyroidectomy N/A 01/09/2014    Procedure: TOTAL  THYROIDECTOMY;  Surgeon: Izora Gala, MD;  Location: [facility];  Service: ENT;  Laterality: N/A;    There were no vitals filed for this visit.  Visit Diagnosis:  Generalized muscle weakness  Decreased functional mobility and endurance  Poor trunk control      Subjective Assessment - 11/06/14 1309    Subjective  Pt reports that she is doing theraband HEP and w/c push-ups every hour   [name] accompained by: Family member   Pertinent History s/p laminectomy 12/14/13, s/p lumbar fusion 12/30/13 by Dr. Cyndy Freeze (secondary to tumor)   Patient Stated Goals improve ADLs (bathing, toileting)   Currently in Pain? Yes   Pain Score 2    Pain Location Shoulder   Pain Orientation Right   Pain Descriptors / Indicators Dull   Pain Type Chronic pain   Aggravating Factors  use   Pain Relieving Factors tylenol                      OT Treatments/Exercises (OP) - 11/06/14 0001    ADLs   Toileting Practiced transfer from mat to pt's BSC and BSC>mat with A for sliding board placement and to help with LE placement only (min A).  Significant difficulty replacing drop arm after transfer while pt on BSC due to pt's size (BSC too narrow for pt's frame).  Recommended pt/sister call  medical supply store and insurance company to see if pt can exchange for  larger BSC as pt has not used it yet.   Functional Mobility Supine>sitting with mod A.  Functional transfer mat>w/c with minimal assistance.  Reviewed importance of attempting to preform bathing and grooming sitting EOB for incr trunk control and in prep for incr independence with bathing.  Pt verbalized understanding.   Exercises   Exercises Hand   Shoulder Exercises: Seated   Other Seated Exercises w/c push-ups x12 with 5sec hold, blue theraband ex:  scapular retraction/rows with elbows by side and away from body x10 each, tricep extension/pull downs x15 each.   Hand Exercises   Theraputty - Grip with R hand with blue putty as pt reports R  hand feels weaker   Theraputty - Pinch with R hand, blue putty   Fine Motor Coordination   Fine Motor Coordination --                OT Education - 11/06/14 1351    Education Details w/c push-up with 5sec hold  (pt reports performing already)   Person(s) Educated Patient   Methods Explanation;Demonstration;Verbal cues   Comprehension Verbalized understanding;Returned demonstration          OT Short Term Goals - 11/06/14 1300    OT SHORT TERM GOAL #1   Title Independent with updated HEP for UE strengthening--check STGs 11/12/14   Baseline dependent   Time 4   Period Weeks   Status On-going   OT SHORT TERM GOAL #2   Title Pt will peform UB bathing on edge of bed with min A.   Baseline mod A lying in bed   Status On-going   OT SHORT TERM GOAL #3   Title Pt to perform transfer to [facility] using sliding board with mod A.   Baseline not performing, using bed pan   Time 4   Status Achieved  11/06/14:  min A in clinic, but not performing at home (pt would benefit from bariatic [facility])   OT SHORT TERM GOAL #4   Title Pt will perform LB dressing with mod A using AE prn.   Baseline mod-max A for LB dressing.   Time 4   Status On-going   OT SHORT TERM GOAL #5   Title Pt will verbalize understanding of appropriate community resources prn.   Baseline dependent   Time 4   Period Weeks   Status Achieved           OT Long Term Goals - 10/27/14 1325    OT LONG TERM GOAL #1   Title Pt will perform UB/LB bathing mod I once transfer to tub bench is completed using AE/DME prn.   Baseline dependent LB, bathing in bed   Time 8   Period Weeks   Status On-going   OT LONG TERM GOAL #2   Title Pt will peform sliding board transfer to tub bench with min A.   Baseline sponge bathing in bed, not performin   Time 8   Period Weeks   Status On-going   OT LONG TERM GOAL #3   Title Pt to perform transfer to [facility] using sliding board with min A.   Baseline not peforming, using bed pan    Time 8   Period Weeks   Status On-going   OT LONG TERM GOAL #4   Title Pt will perform toileting hygiene independently after bowel movement using BSC.   Baseline dependent   Time 8   Period Weeks   Status On-going  Plan - 11/06/14 1320    Clinical Impression Statement Pt is making slow, steady progress towards goals, but decr frequency due to scheduling/illness and fear of falling/new situations has affected progress .    Plan trunk control, review/update HEP prn   OT Home Exercise Plan Education Issued:  [facility] Rehab/Independent Living Info; 11/06/14 Blue putty HEP (grip and pinch), w/c push-up     Consulted and Agree with Plan of Care Family member/caregiver;Patient   Family Member Consulted sister        Problem List Patient Active Problem List   Diagnosis Date Noted  . 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 Dysuria 09/04/2014  . Hypertension 05/01/2014  . Ingrown right big toenail 05/01/2014  . Hypokalemia 05/01/2014  . GERD (gastroesophageal reflux disease) 02/14/2014  . Dysphagia, pharyngoesophageal phase 02/06/2014  . Type 2 diabetes mellitus without complication 21/62/4469  . Constipation   . Neurogenic bowel   . 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 Metastatic cancer to spine 01/19/2014  . Rotator cuff tendonitis   . Acute postoperative respiratory failure 01/09/2014  . 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 Leg weakness, bilateral 12/13/2013    [facility] 11/06/2014, 1:55 PM  [facility] [address] [place], [place], [address] Phone: [phone]   Fax:  [place], OTR/L 11/06/2014 1:55 PM

## 2014-11-07 ENCOUNTER — Encounter: Payer: Self-pay | Admitting: Physical Therapy

## 2014-11-07 NOTE — Therapy (Signed)
[facility] [address] [place], [place], [address] Phone: [phone]   Fax:  [phone]  Physical Therapy Treatment  Patient Details  Name: Sarah [name] MRN: [id number] Date of Birth: 1965/06/30 Referring Provider:  Elberta Leatherwood, MD  Encounter Date: 11/06/2014      PT End of Session - 11/07/14 2019    Visit Number 26   Number of Visits 27   Date for PT Re-Evaluation 11/04/14   Authorization Type BCBS   Authorization Time Period 03-24-14  - 03-24-15   Authorization - Visit Number 21   Authorization - Number of Visits 30   PT Start Time 4854   PT Stop Time 1146   PT Time Calculation (min) 44 min      Past Medical History  Diagnosis Date  . Hypertension   . DDD (degenerative disc disease), cervical   . DJD (degenerative joint disease)   . Obesity   . Chronic back pain   . 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 History of rectal fissure   . Rotator cuff tendonitis right    Past Surgical History  Procedure Laterality Date  . Tonsillectomy    . Laminectomy N/A 12/14/2013    Procedure: THORACIC LAMINECTOMY FOR TUMOR THORACIC THREE;  Surgeon: Ashok Pall, MD;  Location: [facility];  Service: Neurosurgery;  Laterality: N/A;  THORACIC LAMINECTOMY FOR TUMOR THORACIC THREE  . Posterior lumbar fusion 4 level N/A 12/30/2013    Procedure: Thoracic one-Thoracic five posterior thoracic fusion with pedicle screws;  Surgeon: Ashok Pall, MD;  Location: [facility] [place];  Service: Neurosurgery;  Laterality: N/A;  Thoracic one-Thoracic five posterior thoracic fusion with pedicle screws  . Thyroidectomy N/A 01/09/2014    Procedure: TOTAL THYROIDECTOMY;  Surgeon: Izora Gala, MD;  Location: [facility];  Service: ENT;  Laterality: N/A;    There were no vitals filed for this visit.  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 Yes   Pain Score 2    Pain Location Shoulder   Pain Orientation Right   Pain Descriptors / Indicators Dull   Pain Type Chronic pain   Pain Onset More than a month ago   Pain Frequency Constant   Aggravating Factors  activity   Pain Relieving Factors Tylenol                         [facility] Adult PT Treatment/Exercise - 11/07/14 0001    Transfers   Transfers Lateral/Scoot Transfers   Lateral/Scoot Transfers 5: Supervision   Lateral/Scoot Transfer Details (indicate cue type and reason) max assist for placement of board but pt able to weight shift to remove board independently   Static Sitting Balance   Static Sitting - Balance Support Feet supported;Bilateral upper extremity supported   Static Sitting - Level of Assistance 5: Stand by assistance   Static Sitting - Comment/# of Minutes pt performed trunk  rotation x 3 reps to each side   Dynamic Sitting Balance   Dynamic Sitting Balance - Compensations attempted reaching down toward feet - pt able to touch tongue of shoe R and L   Knee/Hip Exercises: Supine   Heel Slides AAROM;10 reps;Both  10 sec hold at chest for stretching   Bridges AAROM;10 reps   Other Supine Knee/Hip Exercises trunk rotation stretch - both sides 30 sec hold   Knee/Hip Exercises: Sidelying   Hip ABduction AROM;Both;10 reps  in hooklying   Bed Mobility   Supine to Sit Details Manual facilitation for placement  mod to max assist to prevent falling back   Sit to Supine - Details Verbal cues for technique  used leg loops to transfer LE's onto mat     THerAct; practiced supine to sit transfer with mod to max assist to maintain position while shifting and placing UE's with cues to Maintain elbow extension; pt sat in long sitting position x approx. 1 1/2 minutes with cues to hold head upright  Sitting on side of mat - stretches for trunk - leaning on forearm - reaching overhead with opposite arm x 15 sec hold Pt transferred LE's onto mat for sit to supine transfer with use of leg loops for 1st time - min assist needed  There Ex; heel slides x 10 reps; hip flexion in hooklying position x 10 reps; hip abduction in hooklying x 10 reps          PT Short Term Goals - 10/05/14 0557    PT SHORT TERM GOAL #1   Title Pt. will transfer with sliding board wheelchair to mat with +1 max assist.  (target date 08-31-14)   Status Achieved   PT SHORT TERM GOAL #2   Title Sit unsupported on side of mat x 5" with +2 mod assist   Status Achieved   PT SHORT TERM GOAL #3   Title Independent to direct HEP (stretches and PROM for bil. LE's)   Status Achieved   PT SHORT TERM GOAL #4   Title Tolerate standing in standing frame x 10" for LE stretching and weight-bearing   (08-31-14)   Status Achieved   PT SHORT TERM GOAL #5   Title Incr. endurance/activity tolerance so pt. able to perform 10' nonstop on recumbent bike (Scifit)  (08-31-14)   Status On-going           PT Long Term Goals - 10/10/14 1350    PT LONG TERM GOAL #1   Title Transfer with sliding board with +1 mod assist wheelchair to/from mat   Status Achieved   PT LONG TERM GOAL #2   Title Sit on side of mat for at least 10" with UE support prn with SBA to incr. independence with dressing, grooming,etc. at home  (09-30-14)   Status Achieved   PT LONG TERM GOAL #3   Title Sit unsupported on side of mat with 1 UE support and reach 4" anteriorly with other UE support with min assist to increase independence with ADL's with mod  (09-30-14)   Status Achieved   PT LONG TERM GOAL #4   Title  Independent in updated HEP  (09-30-14)   Status Achieved   PT LONG TERM GOAL #5   Title Pt. will transfer wheelchair to/from mat with sliding board with S only after set up for placement of board (11-04-14)   Baseline min assist needed for final part of transfer onto mat   Time 4   Period  Weeks   Status On-going   PT LONG TERM GOAL #6   Title Pt. will roll from supine to R and L modified independently on mat and will report ability to do so at home  (11-04-14)   Baseline min assist needed   Time 4   Period Weeks   Status On-going               Plan - 11/07/14 2028    Pt will benefit from skilled therapeutic intervention in order to improve on the following deficits Decreased mobility;Obesity;Decreased range of motion;Impaired flexibility;Increased muscle spasms;Decreased balance;Decreased strength   PT Frequency 2x / week   PT Duration 4 weeks   PT Next Visit Plan check LTG's - renew vs. D/C - discuss pt's choice for use of remaining visits for PT   PT Home Exercise Plan added yellow theraband for LAQ's while seated in wheelchair   Consulted and Agree with Plan of Care Patient;Family member/caregiver   Family Member Consulted pt's sister-[name]        Problem List Patient Active Problem List   Diagnosis Date Noted  . 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 Dysuria 09/04/2014  . Hypertension 05/01/2014  . Ingrown right big toenail 05/01/2014  . Hypokalemia 05/01/2014  . GERD (gastroesophageal reflux disease) 02/14/2014  . Dysphagia, pharyngoesophageal phase 02/06/2014  . Type 2 diabetes mellitus without complication 04/59/9774  . Constipation   . Neurogenic bowel   . 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 Metastatic cancer to spine 01/19/2014  . Rotator cuff tendonitis   . Acute postoperative respiratory failure 01/09/2014  . 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 Leg weakness, bilateral 12/13/2013    Alda Lea, PT 11/07/2014, 8:30 PM  [facility] [address] [place], [place], [address] Phone: [phone]   Fax:  [phone]

## 2014-11-07 NOTE — Patient Instructions (Signed)
Heel Cord Stretch passively  Push foot up and hold 30-45 secs.  http://gt2.exer.us/512   Copyright  VHI. All rights reserved.

## 2014-11-09 ENCOUNTER — Ambulatory Visit: Payer: BLUE CROSS/BLUE SHIELD | Admitting: Physical Therapy

## 2014-11-09 ENCOUNTER — Encounter: Payer: Self-pay | Admitting: Occupational Therapy

## 2014-11-09 ENCOUNTER — Ambulatory Visit: Payer: BLUE CROSS/BLUE SHIELD | Admitting: Occupational Therapy

## 2014-11-09 DIAGNOSIS — R29818 Other symptoms and signs involving the nervous system: Secondary | ICD-10-CM

## 2014-11-09 DIAGNOSIS — M6281 Muscle weakness (generalized): Secondary | ICD-10-CM

## 2014-11-09 DIAGNOSIS — Z7409 Other reduced mobility: Secondary | ICD-10-CM

## 2014-11-09 DIAGNOSIS — G8220 Paraplegia, unspecified: Secondary | ICD-10-CM

## 2014-11-09 DIAGNOSIS — R2991 Unspecified symptoms and signs involving the musculoskeletal system: Secondary | ICD-10-CM

## 2014-11-09 NOTE — Therapy (Signed)
[facility] [address] [place], [place], [address] Phone: [phone]   Fax:  [phone]  Physical Therapy Treatment  Patient Details  Name: Sarah [name] MRN: [id number] Date of Birth: [birth year] Referring Provider:  Elberta Leatherwood, MD  Encounter Date: 11/09/2014      PT End of Session - 11/09/14 1225    Visit Number 27   Number of Visits 27   Date for PT Re-Evaluation 11/04/14   Authorization Type BCBS   Authorization Time Period 03-24-14  - 03-24-15   Authorization - Visit Number 98   Authorization - Number of Visits 30   PT Start Time 1017   PT Stop Time 1104   PT Time Calculation (min) 47 min   Activity Tolerance Patient tolerated treatment well   Behavior During Therapy [facility] for tasks assessed/performed      Past Medical History  Diagnosis Date  . Hypertension   . DDD (degenerative disc disease), cervical   . DJD (degenerative joint disease)   . Obesity   . Chronic back pain   . 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 History of rectal fissure   . Rotator cuff tendonitis right    Past Surgical History  Procedure Laterality Date  . Tonsillectomy    . Laminectomy N/A 12/14/2013    Procedure: THORACIC LAMINECTOMY FOR TUMOR THORACIC THREE;  Surgeon: Ashok Pall, MD;  Location: [facility];  Service: Neurosurgery;  Laterality: N/A;  THORACIC LAMINECTOMY FOR TUMOR THORACIC THREE  . Posterior lumbar fusion 4 level N/A 12/30/2013    Procedure: Thoracic one-Thoracic five posterior thoracic fusion with pedicle screws;  Surgeon: Ashok Pall, MD;  Location: [facility];  Service: Neurosurgery;  Laterality: N/A;  Thoracic one-Thoracic five posterior thoracic fusion with pedicle screws  . Thyroidectomy N/A 01/09/2014    Procedure: TOTAL THYROIDECTOMY;  Surgeon: Izora Gala, MD;  Location: [facility];  Service: ENT;  Laterality: N/A;    There were no vitals filed for this  visit.  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   [name] accompained by: Family member  sister-sharon   Patient Stated Goals Improve mobility   Currently in Pain? No/denies                         [facility] Adult PT Treatment/Exercise - 11/09/14 1210    Bed Mobility   Bed Mobility Rolling Right;Rolling Left   Rolling Right 6: Modified independent (Device/Increase time)   Rolling Right Details (indicate cue type and reason) needs rails   Rolling Left 6: Modified independent (Device/Increase time)   Rolling Left Details (indicate cue type and reason) needs rails   Supine to Sit 3: Mod assist   Supine to Sit Details (indicate cue type and reason) assist for LE's   Sit to Supine 3: Mod assist   Sit to Supine - Details (indicate cue type and reason) assist for LE's   Transfers   Transfers Lateral/Scoot Transfers   Lateral/Scoot Transfers 5: Supervision   Lateral/Scoot Transfer Details (indicate cue type and reason) assist to place to sliding board and min assist to manipulate LE's while transfering   Comments Worked on moving from supine  into long sitting position with mod assist and max verbal cues.  Pt needed min-min guard assist once in long sitting using UE's and to position LE's.  Sat in both long sitting and circle sitting and held these positions for stretching to hamstrings, trunk and low back.  Pt able to tolerate >4 minutes each time in this position.                PT Education - 11/09/14 1222    Education provided Yes   Education Details Holding PT per Guido Sander, PT   Person(s) Educated Patient;Caregiver(s)  sister-sharon   Methods Explanation   Comprehension Verbalized understanding          PT Short Term Goals - 10/05/14 0557     PT SHORT TERM GOAL #1   Title Pt. will transfer with sliding board wheelchair to mat with +1 max assist.  (target date 08-31-14)   Status Achieved   PT SHORT TERM GOAL #2   Title Sit unsupported on side of mat x 5" with +2 mod assist   Status Achieved   PT SHORT TERM GOAL #3   Title Independent to direct HEP (stretches and PROM for bil. LE's)   Status Achieved   PT SHORT TERM GOAL #4   Title Tolerate standing in standing frame x 10" for LE stretching and weight-bearing   (08-31-14)   Status Achieved   PT SHORT TERM GOAL #5   Title Incr. endurance/activity tolerance so pt. able to perform 10' nonstop on recumbent bike (Scifit)  (08-31-14)   Status On-going           PT Long Term Goals - 11/09/14 1223    PT LONG TERM GOAL #1   Title Transfer with sliding board with +1 mod assist wheelchair to/from mat   Status Achieved   PT LONG TERM GOAL #2   Title Sit on side of mat for at least 10" with UE support prn with SBA to incr. independence with dressing, grooming,etc. at home  (09-30-14)   Status Achieved   PT LONG TERM GOAL #3   Title Sit unsupported on side of mat with 1 UE support and reach 4" anteriorly with other UE support with min assist to increase independence with ADL's with mod  (09-30-14)   Status Achieved   PT LONG TERM GOAL #4   Title Independent in updated HEP  (09-30-14)   Status Achieved   PT LONG TERM GOAL #5   Title Pt. will transfer wheelchair to/from mat with sliding board with S only after set up for placement of board (11-04-14)   Baseline needs min assist for LE's despite leg loops   Time 4   Period Weeks   Status Partially Met   PT LONG TERM GOAL #6   Title Pt. will roll from supine to R and L modified independently on mat and will report ability to do so at home  (11-04-14)   Baseline min assist needed   Time 4   Period Weeks   Status Achieved               Plan - 11/09/14 1226    Clinical Impression Statement Pt met LTG 6, #5 partially met secondary  to pt needs min assist to manipulate LE's at times.  Per Guido Sander, PT, pt to be placed on hold for 4 weeks to maximize limited insurance visits.   Pt will benefit from skilled therapeutic intervention in order to improve on the following  deficits Decreased mobility;Obesity;Decreased range of motion;Impaired flexibility;Increased muscle spasms;Decreased balance;Decreased strength   PT Frequency 2x / week   PT Duration 4 weeks   PT Next Visit Plan Place pt on hold for 4 weeks.   PT Home Exercise Plan added yellow theraband for LAQ's while seated in wheelchair   Consulted and Agree with Plan of Care Patient;Family member/caregiver   Family Member Consulted pt's sister-[name]        Problem List Patient Active Problem List   Diagnosis Date Noted  . 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 Dysuria 09/04/2014  . Hypertension 05/01/2014  . Ingrown right big toenail 05/01/2014  . Hypokalemia 05/01/2014  . GERD (gastroesophageal reflux disease) 02/14/2014  . Dysphagia, pharyngoesophageal phase 02/06/2014  . Type 2 diabetes mellitus without complication 56/86/1683  . Constipation   . Neurogenic bowel   . 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 Metastatic cancer to spine 01/19/2014  . Rotator cuff tendonitis   . Acute postoperative respiratory failure 01/09/2014  . 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 Leg weakness, bilateral 12/13/2013    Narda Bonds 11/09/2014, 12:31 PM  [facility] [address] [place], [place], [address] Phone: [phone]   Fax:  [facility], [place] 11/09/2014 12:32 PM Phone: [phone] Fax: [phone]

## 2014-11-09 NOTE — Therapy (Signed)
[facility] [address] [place], [place], [address] Phone: [phone]   Fax:  [phone]  Occupational Therapy Treatment  Patient Details  Name: Sarah [name] MRN: [id number] Date of Birth: 16-Aug-1965 Referring Provider:  Elberta Leatherwood, MD  Encounter Date: 11/09/2014      OT End of Session - 11/09/14 1352    Visit Number 6   Number of Visits 16   Date for OT Re-Evaluation 12/13/14   Authorization Type BCBS primary (54 OT visits) / medicaid secondary (6 OT medicaid visits used, 3 previous BCBS visits used)   Authorization Time Period wk 4/8   Authorization - Visit Number 9  3 previous BCBS visits used   Authorization - Number of Visits 30   OT Start Time 1108   OT Stop Time 1155   OT Time Calculation (min) 47 min   Activity Tolerance Patient tolerated treatment well   Behavior During Therapy [facility] for tasks assessed/performed      Past Medical History  Diagnosis Date  . Hypertension   . DDD (degenerative disc disease), cervical   . DJD (degenerative joint disease)   . Obesity   . Chronic back pain   . 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 History of rectal fissure   . Rotator cuff tendonitis right    Past Surgical History  Procedure Laterality Date  . Tonsillectomy    . Laminectomy N/A 12/14/2013    Procedure: THORACIC LAMINECTOMY FOR TUMOR THORACIC THREE;  Surgeon: Ashok Pall, MD;  Location: [place] [facility];  Service: Neurosurgery;  Laterality: N/A;  THORACIC LAMINECTOMY FOR TUMOR THORACIC THREE  . Posterior lumbar fusion 4 level N/A 12/30/2013    Procedure: Thoracic one-Thoracic five posterior thoracic fusion with pedicle screws;  Surgeon: Ashok Pall, MD;  Location: [place] NEURO ORS;  Service: Neurosurgery;  Laterality: N/A;  Thoracic one-Thoracic five posterior thoracic fusion with pedicle screws  . Thyroidectomy N/A 01/09/2014    Procedure: TOTAL THYROIDECTOMY;   Surgeon: Izora Gala, MD;  Location: [facility];  Service: ENT;  Laterality: N/A;    There were no vitals filed for this visit.  Visit Diagnosis:  Muscle weakness (generalized)  Decreased functional mobility and endurance  Poor trunk control      Subjective Assessment - 11/09/14 1347    Subjective  Pt reports that the medical supply store needs a letter stating why she needs a different BSC   [name] accompained by: Family member   Pertinent History s/p laminectomy 12/14/13, s/p lumbar fusion 12/30/13 by Dr. Cyndy Freeze (secondary to tumor)   Patient Stated Goals improve ADLs (bathing, toileting)   Currently in Pain? No/denies                      OT Treatments/Exercises (OP) - 11/09/14 1359    ADLs   Overall ADLs Functional transfers:  Transfer mat>manual chair (downhill) with sliding board with min A for board placement and LE management.  Weighted pt in manual w/c (subtracting wt. of w/c, pt weighed 275.5lbs).  Then transfer manual w/c to mat (uphill) with min-mod A for transfer and board placement/LE management.  Transfer mat>power w/c with min A for LE management of first LE (but not 2nd) and for board placement (but not removal).     Toileting Practiced sliding board transfer from [employer] and BSC>mat.  Pt performed with min A for board placement and LE management.  Pt would benefit  from wider [facility] as current is not wide enough to easily replace/fasten drop arm after transfer (hip width 20.25 inches, current BSC width between arms is 17inches).    ADL Comments Reviewed recommendation to perform grooming on edge of bed to incr trunk control and UB bathing EOB.  Pt reports that she is washing her face with BUEs now while sitting EOB.  Discussed importance of incr activity with ADLs and performing w/c push-ups.  Pt/sister verbalized understanding.                  OT Short Term Goals - 11/09/14 1357    OT SHORT TERM GOAL #1   Title Independent with updated HEP for UE  strengthening--check STGs 11/12/14   Baseline dependent   Time 4   Period Weeks   Status On-going   OT SHORT TERM GOAL #2   Title Pt will peform UB bathing on edge of bed with min A.   Baseline mod A lying in bed   Status On-going  11/09/14:  not fully met, but progressing towards, pt able to wash face   OT SHORT TERM GOAL #3   Title Pt to perform transfer to [facility] using sliding board with mod A.   Baseline not performing, using bed pan   Time 4   Status Achieved  11/06/14:  min A in clinic, but not performing at home (pt would benefit from bariatic/wider [facility])   OT SHORT TERM GOAL #4   Title Pt will perform LB dressing with mod A using AE prn.   Baseline mod-max A for LB dressing.   Time 4   Status On-going  11/09/14:  mod-max A   OT SHORT TERM GOAL #5   Title Pt will verbalize understanding of appropriate community resources prn.   Baseline dependent   Time 4   Period Weeks   Status Achieved           OT Long Term Goals - 10/27/14 1325    OT LONG TERM GOAL #1   Title Pt will perform UB/LB bathing mod I once transfer to tub bench is completed using AE/DME prn.   Baseline dependent LB, bathing in bed   Time 8   Period Weeks   Status On-going   OT LONG TERM GOAL #2   Title Pt will peform sliding board transfer to tub bench with min A.   Baseline sponge bathing in bed, not performin   Time 8   Period Weeks   Status On-going   OT LONG TERM GOAL #3   Title Pt to perform transfer to [facility] using sliding board with min A.   Baseline not peforming, using bed pan   Time 8   Period Weeks   Status On-going   OT LONG TERM GOAL #4   Title Pt will perform toileting hygiene independently after bowel movement using BSC.   Baseline dependent   Time 8   Period Weeks   Status On-going               Plan - 11/09/14 1353    Clinical Impression Statement Pt would benefit from wider BSC to incr independence as pt is now able to transfer with minimal A, but does not fit well due  to pt's hip width making it difficult to fasten drop arm after transfer (safety concern for falls/skin integrity).  Will attempt to pursue wider BSC.   Plan review/update HEP prn, trunk control   OT Home Exercise Plan Education Issued:  Voc Rehab/Independent Living Info; 11/06/14 Blue putty HEP (grip and pinch), w/c push-up     Consulted and Agree with Plan of Care Family member/caregiver;Patient   Family Member Consulted sister        Problem List Patient Active Problem List   Diagnosis Date Noted  . 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 Dysuria 09/04/2014  . Hypertension 05/01/2014  . Ingrown right big toenail 05/01/2014  . Hypokalemia 05/01/2014  . GERD (gastroesophageal reflux disease) 02/14/2014  . Dysphagia, pharyngoesophageal phase 02/06/2014  . Type 2 diabetes mellitus without complication 83/37/4451  . Constipation   . Neurogenic bowel   . 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 Metastatic cancer to spine 01/19/2014  . Rotator cuff tendonitis   . Acute postoperative respiratory failure 01/09/2014  . 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 Leg weakness, bilateral 12/13/2013    [facility] 11/09/2014, 2:31 PM  [facility] [address] [place], [place], [address] Phone: [phone]   Fax:  [place], OTR/L 11/09/2014 2:31 PM

## 2014-11-13 ENCOUNTER — Ambulatory Visit: Payer: BLUE CROSS/BLUE SHIELD | Admitting: Occupational Therapy

## 2014-11-13 ENCOUNTER — Encounter: Payer: Self-pay | Admitting: Occupational Therapy

## 2014-11-13 DIAGNOSIS — M6281 Muscle weakness (generalized): Secondary | ICD-10-CM

## 2014-11-13 DIAGNOSIS — G8220 Paraplegia, unspecified: Secondary | ICD-10-CM

## 2014-11-13 DIAGNOSIS — Z7409 Other reduced mobility: Secondary | ICD-10-CM

## 2014-11-13 NOTE — Therapy (Signed)
[facility] [address] [place], [place], [address] Phone: [phone]   Fax:  [phone]  Patient Details  Name: Sarah [name] Date of Birth: [birth year] Referring Provider:  Dr. Alger Simons  Encounter Date: 11/13/2014  Letter of Medical Necessity [name] DOB:  22-May-1965  11/13/14  To whom it may concern: [name] is a 49 year old female with paraplegia due to spinal tumor at T4 level.   Patient underwent laminectomy and stabilization 12-14-13 and then underwent lumbar fusion on 12-30-13.  She is currently using a power wheelchair for mobility and transfers with a sliding board.  She has a drop arm bedside commode (BSC).  However, she is unable to use due to her inability to fully fit on [facility] due to her large anatomical size (refer to picture).   With her current BSC, she does not have sufficient space to weight shift in order to perform adequate toileting hygiene.  [name] is 68.5lbs and 5'11.  Her hip width is 20.25 inches; however the opening width of her current BSC is 17 inches with arms locked into place.  [name] can transfer now with minimal assistance with sliding board to [facility], but is still using a bedpan due to inability to appropriately fit on her current BSC.  Pt needs a drop arm BSC that has an opening of at least 25 inches with arm rests locked and seat width of at least 23 inches and weight limit of at least 300lbs.  With a drop arm BSC of this size, [name] could potentially be modified independent with toileting and perform independent hygiene without difficulty.  This piece of DME (larger-size drop arm bedside commode) is a medical necessity for [name] to be independent with toileting.  This particular BSC could also potentially be used as a shower chair and increase independence with bathing.  Thank you,  Vianne Bulls, OTR/L 11/13/2014 12:30 PM    Vianne Bulls, MS,  OTR/L [facility] [place] [address], [place], [place]  [address] Phone:  [phone] Fax:  [phone]    Physician Signature:  ____________________________________________________

## 2014-11-13 NOTE — Therapy (Signed)
[facility] [address] [place], [place], [address] Phone: [phone]   Fax:  [phone]  Occupational Therapy Treatment  Patient Details  Name: Sarah [name] MRN: [id number] Date of Birth: 1966-01-29 Referring Provider:  Elberta Leatherwood, MD  Encounter Date: 11/13/2014      OT End of Session - 11/13/14 1316    Visit Number 7   Number of Visits 16   Date for OT Re-Evaluation 12/13/14   Authorization Type BCBS primary (41 OT visits) / medicaid secondary (6 OT medicaid visits used, 3 previous BCBS visits used)   OT Start Time 1020  pt arrived late   OT Stop Time 1100   OT Time Calculation (min) 40 min   Activity Tolerance Patient tolerated treatment well      Past Medical History  Diagnosis Date  . Hypertension   . DDD (degenerative disc disease), cervical   . DJD (degenerative joint disease)   . Obesity   . Chronic back pain   . 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 History of rectal fissure   . Rotator cuff tendonitis right    Past Surgical History  Procedure Laterality Date  . Tonsillectomy    . Laminectomy N/A 12/14/2013    Procedure: THORACIC LAMINECTOMY FOR TUMOR THORACIC THREE;  Surgeon: Ashok Pall, MD;  Location: [place] NEURO ORS;  Service: Neurosurgery;  Laterality: N/A;  THORACIC LAMINECTOMY FOR TUMOR THORACIC THREE  . Posterior lumbar fusion 4 level N/A 12/30/2013    Procedure: Thoracic one-Thoracic five posterior thoracic fusion with pedicle screws;  Surgeon: Ashok Pall, MD;  Location: [place] NEURO ORS;  Service: Neurosurgery;  Laterality: N/A;  Thoracic one-Thoracic five posterior thoracic fusion with pedicle screws  . Thyroidectomy N/A 01/09/2014    Procedure: TOTAL THYROIDECTOMY;  Surgeon: Izora Gala, MD;  Location: [place];  Service: ENT;  Laterality: N/A;    There were no vitals filed for this visit.  Visit Diagnosis:  Decreased functional mobility and  endurance  Paraplegia  Muscle weakness (generalized)      Subjective Assessment - 11/13/14 1024    Subjective  I am doing more of my upper body bathing at the edge of the bed   [name] accompained by: --  caregiver   Pertinent History s/p laminectomy 12/14/13, s/p lumbar fusion 12/30/13 by Dr. Cyndy Freeze (secondary to tumor)   Patient Stated Goals improve ADLs (bathing, toileting)   Currently in Pain? No/denies                      OT Treatments/Exercises (OP) - 11/13/14 0001    ADLs   LB Dressing Practiced modified circle sit in order for pt to be able to reach feet to begin addressing LB dressing in bed. Currently pt does not have enough range to reach LLE but can reach RLE with some gentle stretching prior to activity. Pt to begin working on daily stretching in modified circle sit in bed at home and practicing donning pants and underwear over RLE.   Functional Mobility Pt able to transfer from wheelchair to and from mat today after set up with sliding board with intermittent min a to assist with legs only. Pt required min a to place and remove board. Pt able to manage own legs off matt prior to returning to chair.   ADL Comments Practiced push ups from wheelchair - pt needed significant instruction for proper positioning for pushups.  PCA instructed also in set up and how to assist.                  OT Short Term Goals - 11/13/14 1026    OT SHORT TERM GOAL #1   Title Independent with updated HEP for UE strengthening--check STGs 11/12/14   Baseline dependent   Time 4   Period Weeks   Status Achieved  pt reports she is doing theraband program with sister at home   OT [place] #2   Title Pt will peform UB bathing on edge of bed with min A.   Baseline mod A lying in bed   Status Achieved   OT SHORT TERM GOAL #3   Title Pt to perform transfer to [facility] using sliding board with mod A.   Baseline not performing, using bed pan   Time 4   Status Achieved   11/06/14:  min A in clinic, but not performing at home (pt would benefit from bariatic [facility])   OT SHORT TERM GOAL #4   Title Pt will perform LB dressing with mod A using AE prn.   Baseline mod-max A for LB dressing.   Time 4   Status On-going   OT SHORT TERM GOAL #5   Title Pt will verbalize understanding of appropriate community resources prn.   Baseline dependent   Time 4   Period Weeks   Status Achieved           OT Long Term Goals - 11/13/14 1314    OT LONG TERM GOAL #1   Title Pt will perform UB/LB bathing mod I once transfer to tub bench is completed using AE/DME prn.   Baseline dependent LB, bathing in bed   Time 8   Period Weeks   Status On-going   OT LONG TERM GOAL #2   Title Pt will peform sliding board transfer to tub bench with min A.   Baseline sponge bathing in bed, not performin   Time 8   Period Weeks   Status On-going   OT LONG TERM GOAL #3   Title Pt to perform transfer to [facility] using sliding board with min A.   Baseline not peforming, using bed pan   Time 8   Period Weeks   Status On-going   OT LONG TERM GOAL #4   Title Pt will perform toileting hygiene independently after bowel movement using BSC.   Baseline dependent   Time 8   Period Weeks   Status On-going               Plan - 11/13/14 1314    Clinical Impression Statement Pt has met 4/5 STG's and is working on last STG and LTG's. Pt with signficant improvement in mobility and is less fearful of movement.    Pt will benefit from skilled therapeutic intervention in order to improve on the following deficits (Retired) Decreased knowledge of use of DME;Impaired flexibility;Decreased coordination;Decreased mobility;Improper body mechanics;Decreased endurance;Decreased activity tolerance;Decreased range of motion;Decreased strength;Other (comment);Impaired UE functional use;Decreased balance   Rehab Potential Good   Clinical Impairments Affecting Rehab Potential severity of deficits   OT  Frequency 2x / week   OT Duration 8 weeks   OT Treatment/Interventions Self-care/ADL training;Neuromuscular education;Therapeutic exercise;Patient/family education;DME and/or AE instruction;Passive range of motion;Manual Therapy;Therapeutic activities;Cryotherapy;Moist Heat;Energy conservation;Therapist, nutritional;Therapeutic exercises;Ultrasound   Plan Pt completing HEP with sister.  Address modified circle sit,donning and doffing shoes and socks, stretching for positioning   OT Home Exercise  Plan Education Issued:  Voc Rehab/Independent Living Info; 11/06/14 Blue putty HEP (grip and pinch), w/c push-up     Consulted and Agree with Plan of Care Family member/caregiver;Patient   Family Member Consulted PCA        Problem List Patient Active Problem List   Diagnosis Date Noted  . 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 Dysuria 09/04/2014  . Hypertension 05/01/2014  . Ingrown right big toenail 05/01/2014  . Hypokalemia 05/01/2014  . GERD (gastroesophageal reflux disease) 02/14/2014  . Dysphagia, pharyngoesophageal phase 02/06/2014  . Type 2 diabetes mellitus without complication 70/78/6754  . Constipation   . Neurogenic bowel   . 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 Metastatic cancer to spine 01/19/2014  . Rotator cuff tendonitis   . Acute postoperative respiratory failure 01/09/2014  . 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 Leg weakness, bilateral 12/13/2013    Quay Burow, OTR/L 11/13/2014, 1:18 PM  [facility] [address] [place], [place], [address] Phone: [phone]   Fax:  [phone]

## 2014-11-17 ENCOUNTER — Other Ambulatory Visit (HOSPITAL_COMMUNITY): Payer: Self-pay | Admitting: Internal Medicine

## 2014-11-17 DIAGNOSIS — C73 Malignant neoplasm of thyroid gland: Secondary | ICD-10-CM

## 2014-11-21 ENCOUNTER — Ambulatory Visit: Payer: BLUE CROSS/BLUE SHIELD | Admitting: Occupational Therapy

## 2014-11-21 ENCOUNTER — Encounter: Payer: Self-pay | Admitting: Occupational Therapy

## 2014-11-21 ENCOUNTER — Telehealth: Payer: Self-pay | Admitting: Family Medicine

## 2014-11-21 DIAGNOSIS — G8220 Paraplegia, unspecified: Secondary | ICD-10-CM

## 2014-11-21 DIAGNOSIS — Z7409 Other reduced mobility: Secondary | ICD-10-CM

## 2014-11-21 DIAGNOSIS — R29818 Other symptoms and signs involving the nervous system: Secondary | ICD-10-CM

## 2014-11-21 DIAGNOSIS — M6281 Muscle weakness (generalized): Secondary | ICD-10-CM

## 2014-11-21 DIAGNOSIS — R2991 Unspecified symptoms and signs involving the musculoskeletal system: Secondary | ICD-10-CM

## 2014-11-21 NOTE — Telephone Encounter (Signed)
Pt wants to talk to dr Alease Frame.  She has wonderful news to tell him!  [phone]

## 2014-11-21 NOTE — Therapy (Signed)
[facility] [address] [place], [place], [address] Phone: [phone]   Fax:  [phone]  Occupational Therapy Treatment  Patient Details  Name: Sarah [name] MRN: [id number] Date of Birth: 1965/05/05 Referring Provider:  Elberta Leatherwood, MD  Encounter Date: 11/21/2014      OT End of Session - 11/21/14 1555    Visit Number 8   Number of Visits 16   Date for OT Re-Evaluation 12/13/14   Authorization Type BCBS primary (81 OT visits) / medicaid secondary (6 OT medicaid visits used, 3 previous BCBS visits used)   OT Start Time 1103   OT Stop Time 1145   OT Time Calculation (min) 42 min   Activity Tolerance Patient tolerated treatment well      Past Medical History  Diagnosis Date  . Hypertension   . DDD (degenerative disc disease), cervical   . DJD (degenerative joint disease)   . Obesity   . Chronic back pain   . 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 History of rectal fissure   . Rotator cuff tendonitis right    Past Surgical History  Procedure Laterality Date  . Tonsillectomy    . Laminectomy N/A 12/14/2013    Procedure: THORACIC LAMINECTOMY FOR TUMOR THORACIC THREE;  Surgeon: Ashok Pall, MD;  Location: [place] [facility];  Service: Neurosurgery;  Laterality: N/A;  THORACIC LAMINECTOMY FOR TUMOR THORACIC THREE  . Posterior lumbar fusion 4 level N/A 12/30/2013    Procedure: Thoracic one-Thoracic five posterior thoracic fusion with pedicle screws;  Surgeon: Ashok Pall, MD;  Location: [place] [facility];  Service: Neurosurgery;  Laterality: N/A;  Thoracic one-Thoracic five posterior thoracic fusion with pedicle screws  . Thyroidectomy N/A 01/09/2014    Procedure: TOTAL THYROIDECTOMY;  Surgeon: Izora Gala, MD;  Location: [place];  Service: ENT;  Laterality: N/A;    There were no vitals filed for this visit.  Visit Diagnosis:  Decreased functional mobility and  endurance  Paraplegia  Muscle weakness (generalized)  Poor trunk control      Subjective Assessment - 11/21/14 1110    Subjective  I am moving better by myself - I can even move my legs most of the time wihhout my leg lifters   [name] accompained by: Family member  sister and PCA   Pertinent History s/p laminectomy 12/14/13, s/p lumbar fusion 12/30/13 by Dr. Cyndy Freeze (secondary to tumor)   Patient Stated Goals improve ADLs (bathing, toileting)   Currently in Pain? Yes   Pain Score 1    Pain Location Leg   Pain Orientation Right;Left   Pain Descriptors / Indicators Aching   Pain Type Chronic pain   Pain Onset More than a month ago   Pain Frequency Constant   Aggravating Factors  nothing they just get tired by end of the day   Pain Relieving Factors tylenol   Multiple Pain Sites Yes   Pain Score 1   Pain Location Back   Pain Orientation Mid   Pain Descriptors / Indicators Aching   Pain Type Chronic pain   Pain Onset More than a month ago   Pain Frequency Intermittent   Aggravating Factors  lay in the bed too long   Pain Relieving Factors getting up and moving, tylenol                      OT Treatments/Exercises (OP) - 11/21/14 0001  ADLs   Functional Mobility Pt able to transfer today with set up only for sllding board - pt was able to remove board mod I.  Pt was also able to manage LE's today with close supervision.    Neurological Re-education Exercises   Other Exercises 1 Neuro re ed to address sit to stand today with emphasis on leaning forward, weight shifting, and pushing with LE's off an elevated surface. Once in standing focus on alignment and turning LE's on as much as possible. Initially sit to stand/static standing took max a of two however with practice pt was able to complete with mod a x2 and maintain LLE knee extension/control for brief period without assist.                   OT Short Term Goals - 11/21/14 1549    OT SHORT TERM  GOAL #1   Title Independent with updated HEP for UE strengthening--check STGs 11/12/14   Baseline dependent   Time 4   Period Weeks   Status Achieved  pt reports she is doing theraband program with sister at home   OT [place] #2   Title Pt will peform UB bathing on edge of bed with min A.   Baseline mod A lying in bed   Status Achieved   OT SHORT TERM GOAL #3   Title Pt to perform transfer to [facility] using sliding board with mod A.   Baseline not performing, using bed pan   Time 4   Status Achieved  11/06/14:  min A in clinic, but not performing at home (pt would benefit from bariatic [facility])   OT SHORT TERM GOAL #4   Title Pt will perform LB dressing with mod A using AE prn.   Baseline mod-max A for LB dressing.   Time 4   Status On-going   OT SHORT TERM GOAL #5   Title Pt will verbalize understanding of appropriate community resources prn.   Baseline dependent   Time 4   Period Weeks   Status Achieved           OT Long Term Goals - 11/21/14 1549    OT LONG TERM GOAL #1   Title Pt will perform UB/LB bathing mod I once transfer to tub bench is completed using AE/DME prn.   Baseline dependent LB, bathing in bed   Time 8   Period Weeks   Status On-going   OT LONG TERM GOAL #2   Title Pt will peform sliding board transfer to tub bench with min A.   Baseline sponge bathing in bed, not performin   Time 8   Period Weeks   Status On-going   OT LONG TERM GOAL #3   Title Pt to perform transfer to [facility] using sliding board with min A.   Baseline not peforming, using bed pan   Time 8   Period Weeks   Status On-going   OT LONG TERM GOAL #4   Title Pt will perform toileting hygiene independently after bowel movement using BSC.   Baseline dependent   Time 8   Period Weeks   Status On-going               Plan - 11/21/14 1550    Clinical Impression Statement Pt made great gains this session with functional mobility.  Pt with improved functional transfers wheelchair  to mat and stood with mod a x2.     Pt will benefit from skilled  therapeutic intervention in order to improve on the following deficits (Retired) Decreased knowledge of use of DME;Impaired flexibility;Decreased coordination;Decreased mobility;Improper body mechanics;Decreased endurance;Decreased activity tolerance;Decreased range of motion;Decreased strength;Other (comment);Impaired UE functional use;Decreased balance   Rehab Potential Good   Clinical Impairments Affecting Rehab Potential severity of deficits   OT Frequency 2x / week   OT Duration 8 weeks   OT Treatment/Interventions Self-care/ADL training;Neuromuscular education;Therapeutic exercise;Patient/family education;DME and/or AE instruction;Passive range of motion;Manual Therapy;Therapeutic activities;Cryotherapy;Moist Heat;Energy conservation;Therapist, nutritional;Therapeutic exercises;Ultrasound   Plan contiue to address sit to stand and static standing; also address tub bench and commode transfers.   Consulted and Agree with Plan of Care Patient;Family member/caregiver   Family Member Consulted PCA and sister        Problem List Patient Active Problem List   Diagnosis Date Noted  . 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 Dysuria 09/04/2014  . Hypertension 05/01/2014  . Ingrown right big toenail 05/01/2014  . Hypokalemia 05/01/2014  . GERD (gastroesophageal reflux disease) 02/14/2014  . Dysphagia, pharyngoesophageal phase 02/06/2014  . Type 2 diabetes mellitus without complication 09/98/3382  . Constipation   . Neurogenic bowel   . 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 Metastatic cancer to spine 01/19/2014  . Rotator cuff tendonitis   . Acute postoperative respiratory failure 01/09/2014  . 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 Leg weakness, bilateral 12/13/2013    Quay Burow, OTR/L 11/21/2014, 3:56 PM  [facility] [address] [place], [place], [address] Phone: [phone]   Fax:  [phone]

## 2014-11-21 NOTE — Telephone Encounter (Signed)
Phone note closed in error.

## 2014-11-22 NOTE — Telephone Encounter (Signed)
Called patient >> she stood up! Huge Huge milestone. Super proud. Very excited. No upcoming appointments w/ me but I told her I'll be looking forward to watching this progress.

## 2014-11-23 ENCOUNTER — Ambulatory Visit: Payer: BLUE CROSS/BLUE SHIELD | Admitting: Occupational Therapy

## 2014-11-24 ENCOUNTER — Ambulatory Visit: Payer: BLUE CROSS/BLUE SHIELD | Attending: Family Medicine | Admitting: Occupational Therapy

## 2014-11-24 ENCOUNTER — Encounter: Payer: Self-pay | Admitting: Occupational Therapy

## 2014-11-24 DIAGNOSIS — M6281 Muscle weakness (generalized): Secondary | ICD-10-CM | POA: Diagnosis present

## 2014-11-24 DIAGNOSIS — Z7409 Other reduced mobility: Secondary | ICD-10-CM | POA: Diagnosis present

## 2014-11-24 DIAGNOSIS — G8220 Paraplegia, unspecified: Secondary | ICD-10-CM | POA: Insufficient documentation

## 2014-11-24 DIAGNOSIS — R29818 Other symptoms and signs involving the nervous system: Secondary | ICD-10-CM

## 2014-11-24 DIAGNOSIS — R2991 Unspecified symptoms and signs involving the musculoskeletal system: Secondary | ICD-10-CM | POA: Diagnosis not present

## 2014-11-24 NOTE — Therapy (Signed)
[facility] [address] [place], [place], [address] Phone: [phone]   Fax:  [phone]  Occupational Therapy Treatment  Patient Details  Name: Sarah [name] MRN: [id number] Date of Birth: Jan 30, 1966 Referring Provider:  Elberta Leatherwood, MD  Encounter Date: 11/24/2014      OT End of Session - 11/24/14 1643    Visit Number 9   Number of Visits 16   Date for OT Re-Evaluation 12/13/14   Authorization Type BCBS primary (32 OT visits) / medicaid secondary (6 OT medicaid visits used, 3 previous BCBS visits used)   OT Start Time 1540   OT Stop Time 1618   OT Time Calculation (min) 38 min   Equipment Utilized During Treatment slide board   Activity Tolerance Patient tolerated treatment well   Behavior During Therapy [facility] for tasks assessed/performed      Past Medical History  Diagnosis Date  . Hypertension   . DDD (degenerative disc disease), cervical   . DJD (degenerative joint disease)   . Obesity   . Chronic back pain   . 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 History of rectal fissure   . Rotator cuff tendonitis right    Past Surgical History  Procedure Laterality Date  . Tonsillectomy    . Laminectomy N/A 12/14/2013    Procedure: THORACIC LAMINECTOMY FOR TUMOR THORACIC THREE;  Surgeon: Ashok Pall, MD;  Location: [place] [facility]UR[facility];  Service: Neurosurgery;  Laterality: N/A;  THORACIC LAMINECTOMY FOR TUMOR THORACIC THREE  . Posterior lumbar fusion 4 level N/A 12/30/2013    Procedure: Thoracic one-Thoracic five posterior thoracic fusion with pedicle screws;  Surgeon: Ashok Pall, MD;  Location: [place] [facility]UR[facility];  Service: Neurosurgery;  Laterality: N/A;  Thoracic one-Thoracic five posterior thoracic fusion with pedicle screws  . Thyroidectomy N/A 01/09/2014    Procedure: TOTAL THYROIDECTOMY;  Surgeon: Izora Gala, MD;  Location: [facility];  Service: ENT;  Laterality: N/A;    There were  no vitals filed for this visit.  Visit Diagnosis:  Poor trunk control  Paraplegia  Decreased functional mobility and endurance  Muscle weakness (generalized)      Subjective Assessment - 11/24/14 1634    Subjective  I stood up with Santiago Glad last time- did you hear?   [name] accompained by: Family member   Pertinent History s/p laminectomy 12/14/13, s/p lumbar fusion 12/30/13 by Dr. Cyndy Freeze (secondary to tumor)   Patient Stated Goals improve ADLs (bathing, toileting)   Currently in Pain? No/denies   Pain Score 0-No pain                      OT Treatments/Exercises (OP) - 11/24/14 0001    Neurological Re-education Exercises   Other Exercises 1 Neuro reeducation to address sustained muscle control in legs for transitional movements; sit to stand, static stand, beginning dynamic standing, stand to squat, and stand to sit.  Patient with improved ability to activate and sustain quad/glut control for these transtions.  Assist of 2 people required  with mod assist each.  Also addressed active squat pivot transitions in preparation for bathroom transfers where slide board is less effective.  Patient able to tflex forward sufficiently to unweight bottom and lift off surface with mod assist of one person.                  OT Education - 11/24/14 1641    Education provided  Yes   Education Details Forward flexion requirements to allow sit to stand and squat transitions.  (Patient fearful of forward weight shifts.)   Person(s) Educated Patient;Parent(s);Caregiver(s)   Methods Explanation;Demonstration   Comprehension Verbalized understanding;Returned demonstration;Tactile cues required          OT Short Term Goals - 11/21/14 1549    OT SHORT TERM GOAL #1   Title Independent with updated HEP for UE strengthening--check STGs 11/12/14   Baseline dependent   Time 4   Period Weeks   Status Achieved  pt reports she is doing theraband program with sister at home   OT  [place] #2   Title Pt will peform UB bathing on edge of bed with min A.   Baseline mod A lying in bed   Status Achieved   OT SHORT TERM GOAL #3   Title Pt to perform transfer to [facility] using sliding board with mod A.   Baseline not performing, using bed pan   Time 4   Status Achieved  11/06/14:  min A in clinic, but not performing at home (pt would benefit from bariatic [facility])   OT SHORT TERM GOAL #4   Title Pt will perform LB dressing with mod A using AE prn.   Baseline mod-max A for LB dressing.   Time 4   Status On-going   OT SHORT TERM GOAL #5   Title Pt will verbalize understanding of appropriate community resources prn.   Baseline dependent   Time 4   Period Weeks   Status Achieved           OT Long Term Goals - 11/21/14 1549    OT LONG TERM GOAL #1   Title Pt will perform UB/LB bathing mod I once transfer to tub bench is completed using AE/DME prn.   Baseline dependent LB, bathing in bed   Time 8   Period Weeks   Status On-going   OT LONG TERM GOAL #2   Title Pt will peform sliding board transfer to tub bench with min A.   Baseline sponge bathing in bed, not performin   Time 8   Period Weeks   Status On-going   OT LONG TERM GOAL #3   Title Pt to perform transfer to [facility] using sliding board with min A.   Baseline not peforming, using bed pan   Time 8   Period Weeks   Status On-going   OT LONG TERM GOAL #4   Title Pt will perform toileting hygiene independently after bowel movement using BSC.   Baseline dependent   Time 8   Period Weeks   Status On-going               Plan - 11/24/14 1644    Clinical Impression Statement Patient showing active movement in bilateral lower extremities, as well as sensation in LE's, and this is translating into significant improvement with functional mobility.     Pt will benefit from skilled therapeutic intervention in order to improve on the following deficits (Retired) Decreased knowledge of use of DME;Impaired  flexibility;Decreased coordination;Decreased mobility;Improper body mechanics;Decreased endurance;Decreased activity tolerance;Decreased range of motion;Decreased strength;Other (comment);Impaired UE functional use;Decreased balance   Rehab Potential Good   OT Frequency 2x / week   OT Duration 8 weeks   OT Treatment/Interventions Self-care/ADL training;Neuromuscular education;Therapeutic exercise;Patient/family education;DME and/or AE instruction;Passive range of motion;Manual Therapy;Therapeutic activities;Cryotherapy;Moist Heat;Energy conservation;Therapist, nutritional;Therapeutic exercises;Ultrasound   Plan sit to stand, squat pivot without board, static standing - with  attempts to reduce UE support   Consulted and Agree with Plan of Care Patient;Family member/caregiver   Family Member Consulted sister and mom        Problem List Patient Active Problem List   Diagnosis Date Noted  . 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 Dysuria 09/04/2014  . Hypertension 05/01/2014  . Ingrown right big toenail 05/01/2014  . Hypokalemia 05/01/2014  . GERD (gastroesophageal reflux disease) 02/14/2014  . Dysphagia, pharyngoesophageal phase 02/06/2014  . Type 2 diabetes mellitus without complication 14/60/4799  . Constipation   . Neurogenic bowel   . 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 Metastatic cancer to spine 01/19/2014  . Rotator cuff tendonitis   . Acute postoperative respiratory failure 01/09/2014  . 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 Leg weakness, bilateral 12/13/2013    Mariah Milling, OTR/L 11/24/2014, 4:47 PM  [facility] [address] [place], [place], [address] Phone: [phone]   Fax:  [phone]

## 2014-11-28 ENCOUNTER — Encounter: Payer: Self-pay | Admitting: Occupational Therapy

## 2014-11-28 ENCOUNTER — Telehealth: Payer: Self-pay | Admitting: *Deleted

## 2014-11-28 ENCOUNTER — Ambulatory Visit: Payer: BLUE CROSS/BLUE SHIELD | Admitting: Occupational Therapy

## 2014-11-28 DIAGNOSIS — Z7409 Other reduced mobility: Secondary | ICD-10-CM

## 2014-11-28 DIAGNOSIS — R2991 Unspecified symptoms and signs involving the musculoskeletal system: Secondary | ICD-10-CM | POA: Diagnosis not present

## 2014-11-28 NOTE — Telephone Encounter (Signed)
Vicky called to change her appt date with Dr Naaman Plummer.  This has been handled by [employer].

## 2014-11-28 NOTE — Therapy (Signed)
[facility] [address] [place], [place], [address] Phone: [phone]   Fax:  [phone]  Occupational Therapy Treatment  Patient Details  Name: Sarah [name] MRN: [id number] Date of Birth: [birth year] Referring Provider:  Elberta Leatherwood, MD  Encounter Date: 11/28/2014      OT End of Session - 11/28/14 1045    Visit Number 10   Number of Visits 16   Date for OT Re-Evaluation 12/13/14   Authorization Type BCBS primary (57 OT visits) / medicaid secondary (6 OT medicaid visits used, 3 previous BCBS visits used)   Authorization - Visit Number 10   OT Start Time 0935   OT Stop Time 1015   OT Time Calculation (min) 40 min   Activity Tolerance Patient tolerated treatment well   Behavior During Therapy [facility] for tasks assessed/performed      Past Medical History  Diagnosis Date  . Hypertension   . DDD (degenerative disc disease), cervical   . DJD (degenerative joint disease)   . Obesity   . Chronic back pain   . 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 History of rectal fissure   . Rotator cuff tendonitis right    Past Surgical History  Procedure Laterality Date  . Tonsillectomy    . Laminectomy N/A 12/14/2013    Procedure: THORACIC LAMINECTOMY FOR TUMOR THORACIC THREE;  Surgeon: Ashok Pall, MD;  Location: [place] [facility]UR[facility];  Service: Neurosurgery;  Laterality: N/A;  THORACIC LAMINECTOMY FOR TUMOR THORACIC THREE  . Posterior lumbar fusion 4 level N/A 12/30/2013    Procedure: Thoracic one-Thoracic five posterior thoracic fusion with pedicle screws;  Surgeon: Ashok Pall, MD;  Location: [place] NEURO ORS;  Service: Neurosurgery;  Laterality: N/A;  Thoracic one-Thoracic five posterior thoracic fusion with pedicle screws  . Thyroidectomy N/A 01/09/2014    Procedure: TOTAL THYROIDECTOMY;  Surgeon: Izora Gala, MD;  Location: [place];  Service: ENT;  Laterality: N/A;    There were no vitals filed  for this visit.  Visit Diagnosis:  Decreased functional mobility and endurance      Subjective Assessment - 11/28/14 1028    Subjective  I picked up a container of wipes from the floor.  I was so proud of myself.   [name] accompained by: Family member  sister [name]   Pertinent History s/p laminectomy 12/14/13, s/p lumbar fusion 12/30/13 by Dr. Cyndy Freeze (secondary to tumor)   Patient Stated Goals improve ADLs (bathing, toileting)   Currently in Pain? No/denies   Pain Score 0-No pain                      OT Treatments/Exercises (OP) - 11/28/14 0001    Transfers   Transfers Sit to Stand;Stand to Sit   Sit to Stand 3: Mod assist;1: +2 Total assist   Stand to Sit 3: Mod assist;1: +2 Total assist   Comments arms over the shoulder of two people for support, assist at knees.     ADLs   LB Dressing Patient arrived with shoes untied.  Once seated at edge of mat, patient encouraged to tie shoes by flexing forward toward feet.  Patient very fearful of forward weight translation, although with foot placed on block- patient able to slowly flex forward and utilize both hands simultaneously to tie shoes.  Discussed this with patient and sister as this can translate to increased ability to don pants over feet while seated  edge of bed.  Encouraged increased time for practice of this skill prior to next OT session.   Functional Mobility Practiced transferring in and out of power wheelchair today without the use of the slide board,  Patient encouraged to flex forward to unweight bottom, to enhance the effectiveness of her movements.  Patient anxious, yet able to transfer in both directions with only incidental assistance for foot rest placement.  While seated at edge of mat, encouraged patient to flex forward to move power chair out of the way.  Patient able to adjust armrests, back wheelchair up, replace thigh - abductor pad today without assistance.    Neurological Re-education Exercises    Other Exercises 1 Neuromuscular reeducation to address LE control and activation during transitional movements such as sit to squat, squat pivot, sit to stand,a nd stand to sit.  Patient able to stand (static) for 10 seconds with cueing to extend knees , hips and achieve erect spine.  Once standing able to reduce knee support, and patient able to slightly control initial aspect of descent (stand to sit)                OT Education - 11/28/14 1043    Education provided Yes   Education Details Forward flexion and lateral weight shifts to increase independence with lower body dressing.    Person(s) Educated Associate Professor)  sister   Methods Explanation;Demonstration   Comprehension Verbalized understanding;Returned demonstration          OT Short Term Goals - 11/21/14 1549    OT SHORT TERM GOAL #1   Title Independent with updated HEP for UE strengthening--check STGs 11/12/14   Baseline dependent   Time 4   Period Weeks   Status Achieved  pt reports she is doing theraband program with sister at home   OT [place] #2   Title Pt will peform UB bathing on edge of bed with min A.   Baseline mod A lying in bed   Status Achieved   OT SHORT TERM GOAL #3   Title Pt to perform transfer to [facility] using sliding board with mod A.   Baseline not performing, using bed pan   Time 4   Status Achieved  11/06/14:  min A in clinic, but not performing at home (pt would benefit from bariatic [facility])   OT SHORT TERM GOAL #4   Title Pt will perform LB dressing with mod A using AE prn.   Baseline mod-max A for LB dressing.   Time 4   Status On-going   OT SHORT TERM GOAL #5   Title Pt will verbalize understanding of appropriate community resources prn.   Baseline dependent   Time 4   Period Weeks   Status Achieved           OT Long Term Goals - 11/28/14 1049    OT LONG TERM GOAL #1   Status --  Patient still unable to get chair into bathroom- calling independent living services                Plan - 11/28/14 1046    Clinical Impression Statement Patient showing improved active movement and sensation in both legs, and this is translating into significant improvement in functional mobility   Pt will benefit from skilled therapeutic intervention in order to improve on the following deficits (Retired) Decreased knowledge of use of DME;Impaired flexibility;Decreased coordination;Decreased mobility;Improper body mechanics;Decreased endurance;Decreased activity tolerance;Decreased range of motion;Decreased strength;Other (comment);Impaired UE functional use;Decreased  balance   Rehab Potential Good   Clinical Impairments Affecting Rehab Potential severity of deficits   OT Frequency 2x / week   OT Duration 8 weeks   OT Treatment/Interventions Self-care/ADL training;Neuromuscular education;Therapeutic exercise;Patient/family education;DME and/or AE instruction;Passive range of motion;Manual Therapy;Therapeutic activities;Cryotherapy;Moist Heat;Energy conservation;Therapist, nutritional;Therapeutic exercises;Ultrasound   Plan squat pivot using LE's, sit to stand, stand to sit, static to more dynamic standing with attempts to reduce UE support.  Check LB dressing - goal for next appointment was for her to get pants on by herself.   Consulted and Agree with Plan of Care Patient;Family member/caregiver   Family Member Consulted sisterIvin Booty        Problem List Patient Active Problem List   Diagnosis Date Noted  . 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 Dysuria 09/04/2014  . Hypertension 05/01/2014  . Ingrown right big toenail 05/01/2014  . Hypokalemia 05/01/2014  . GERD (gastroesophageal reflux disease) 02/14/2014  . Dysphagia, pharyngoesophageal phase 02/06/2014  . Type 2 diabetes mellitus without complication 22/56/7209  . Constipation   . Neurogenic bowel   . 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 Metastatic cancer to spine 01/19/2014  . Rotator cuff tendonitis    . Acute postoperative respiratory failure 01/09/2014  . 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 Leg weakness, bilateral 12/13/2013    Mariah Milling, OTR/L 11/28/2014, 10:51 AM  [facility] [address] [place], [place], [address] Phone: [phone]   Fax:  [phone]

## 2014-11-30 ENCOUNTER — Encounter: Payer: Self-pay | Admitting: Occupational Therapy

## 2014-11-30 ENCOUNTER — Ambulatory Visit: Payer: BLUE CROSS/BLUE SHIELD | Admitting: Occupational Therapy

## 2014-11-30 ENCOUNTER — Encounter (HOSPITAL_COMMUNITY): Payer: BLUE CROSS/BLUE SHIELD

## 2014-11-30 DIAGNOSIS — R29818 Other symptoms and signs involving the nervous system: Secondary | ICD-10-CM

## 2014-11-30 DIAGNOSIS — G8220 Paraplegia, unspecified: Secondary | ICD-10-CM

## 2014-11-30 DIAGNOSIS — M6281 Muscle weakness (generalized): Secondary | ICD-10-CM

## 2014-11-30 DIAGNOSIS — R2991 Unspecified symptoms and signs involving the musculoskeletal system: Secondary | ICD-10-CM

## 2014-11-30 DIAGNOSIS — Z7409 Other reduced mobility: Secondary | ICD-10-CM

## 2014-11-30 NOTE — Therapy (Signed)
[facility] [address] [place], [place], [address] Phone: [phone]   Fax:  [phone]  Occupational Therapy Treatment  Patient Details  Name: Sarah [name] MRN: [id number] Date of Birth: Dec 07, 1965 Referring Provider:  Elberta Leatherwood, MD  Encounter Date: 11/30/2014      OT End of Session - 11/30/14 1212    Visit Number 11   Number of Visits 16   Date for OT Re-Evaluation 12/13/14   Authorization Type BCBS primary (40 OT visits) / medicaid secondary (6 OT medicaid visits used, 3 previous BCBS visits used)   OT Start Time 0931   OT Stop Time 1014   OT Time Calculation (min) 43 min   Activity Tolerance Patient tolerated treatment well      Past Medical History  Diagnosis Date  . Hypertension   . DDD (degenerative disc disease), cervical   . DJD (degenerative joint disease)   . Obesity   . Chronic back pain   . 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 History of rectal fissure   . Rotator cuff tendonitis right    Past Surgical History  Procedure Laterality Date  . Tonsillectomy    . Laminectomy N/A 12/14/2013    Procedure: THORACIC LAMINECTOMY FOR TUMOR THORACIC THREE;  Surgeon: Ashok Pall, MD;  Location: [facility];  Service: Neurosurgery;  Laterality: N/A;  THORACIC LAMINECTOMY FOR TUMOR THORACIC THREE  . Posterior lumbar fusion 4 level N/A 12/30/2013    Procedure: Thoracic one-Thoracic five posterior thoracic fusion with pedicle screws;  Surgeon: Ashok Pall, MD;  Location: [facility];  Service: Neurosurgery;  Laterality: N/A;  Thoracic one-Thoracic five posterior thoracic fusion with pedicle screws  . Thyroidectomy N/A 01/09/2014    Procedure: TOTAL THYROIDECTOMY;  Surgeon: Izora Gala, MD;  Location: [facility];  Service: ENT;  Laterality: N/A;    There were no vitals filed for this visit.  Visit Diagnosis:  Decreased functional mobility and  endurance  Paraplegia  Poor trunk control  Muscle weakness (generalized)      Subjective Assessment - 11/30/14 0934    Subjective  I can put my pants on by myself in bed   [name] accompained by: Family member  sister   Pertinent History s/p laminectomy 12/14/13, s/p lumbar fusion 12/30/13 by Dr. Cyndy Freeze (secondary to tumor)   Patient Stated Goals improve ADLs (bathing, toileting)   Currently in Pain? Yes   Pain Score 1    Pain Location Leg   Pain Orientation Right;Left   Pain Descriptors / Indicators --  stiff   Pain Type Chronic pain   Pain Onset More than a month ago   Pain Frequency Intermittent   Aggravating Factors  nothing that I know of   Pain Relieving Factors tylenol                      OT Treatments/Exercises (OP) - 11/30/14 0001    ADLs   LB Dressing Discussed bathing  - pt will do perinal area in bed then sit EOB to wash all but back and feet. Pt and sister in agreement. Pt to start this tomorrow.     Functional Mobility Practiced transferring from wheelchair to mat and then mat to large commode without sliding board. Also addressed simulated hygiene by leaning side to side on commode and using mat as pts bed and chair on other side. Pt able to lean better to  left and discussed using this as a strategy for hygiene.  Still awaiting MD approval for letter of necessity for large commod given pt's size (standard commode is not feasible for pt to use).  Also addressed leaning foward and hand placement for transfers so that pt is better able to shift weight forward to complete scoots. Pt only needed occasional min a for transfers (4 in all) due to fatigue.                   OT Short Term Goals - 11/30/14 1207    OT SHORT TERM GOAL #1   Title Independent with updated HEP for UE strengthening--check STGs 11/12/14   Baseline dependent   Time 4   Period Weeks   Status Achieved  pt reports she is doing theraband program with sister at home   OT  [place] #2   Title Pt will peform UB bathing on edge of bed with min A.   Baseline mod A lying in bed   Status Achieved   OT SHORT TERM GOAL #3   Title Pt to perform transfer to [facility] using sliding board with mod A.   Baseline not performing, using bed pan   Time 4   Status Achieved  11/06/14:  min A in clinic, but not performing at home (pt would benefit from bariatic [facility])   OT SHORT TERM GOAL #4   Title Pt will perform LB dressing with mod A using AE prn.   Baseline mod-max A for LB dressing.   Time 4   Status Achieved   OT SHORT TERM GOAL #5   Title Pt will verbalize understanding of appropriate community resources prn.   Baseline dependent   Time 4   Period Weeks   Status Achieved           OT Long Term Goals - 11/30/14 1208    OT LONG TERM GOAL #1   Title Pt will perform UB/LB bathing mod I once transfer to tub bench is completed using AE/DME prn. - 12/08/2014   Baseline dependent LB, bathing in bed   Time 8   Period Weeks   Status On-going   OT LONG TERM GOAL #2   Title Pt will peform sliding board transfer to tub bench with min A.   Baseline sponge bathing in bed, not performin   Time 8   Period Weeks   Status On-going   OT LONG TERM GOAL #3   Title Pt to perform transfer to [facility] using sliding board with min A.  completed without sliding board   Baseline not peforming, using bed pan   Time 8   Period Weeks   Status Achieved   OT LONG TERM GOAL #4   Title Pt will perform toileting hygiene independently after bowel movement using BSC.   Baseline dependent   Time 8   Period Weeks   Status On-going               Plan - 11/30/14 1210    Clinical Impression Statement Pt making excellent progress in functional mobility as well as increasing participation and independence in ADL's at home. Pt very motivated and with much brighter affect.    Pt will benefit from skilled therapeutic intervention in order to improve on the following deficits (Retired)  Decreased knowledge of use of DME;Impaired flexibility;Decreased coordination;Decreased mobility;Improper body mechanics;Decreased endurance;Decreased activity tolerance;Decreased range of motion;Decreased strength;Other (comment);Impaired UE functional use;Decreased balance   Rehab Potential Good  Clinical Impairments Affecting Rehab Potential severity of deficits   OT Frequency 2x / week   OT Duration 8 weeks   OT Treatment/Interventions Self-care/ADL training;Neuromuscular education;Therapeutic exercise;Patient/family education;DME and/or AE instruction;Passive range of motion;Manual Therapy;Therapeutic activities;Cryotherapy;Moist Heat;Energy conservation;Therapist, nutritional;Therapeutic exercises;Ultrasound   Plan active squat transfers, sit to stand, standing, check bathing (should be min a with back and feet only), tub bench transfers   OT Home Exercise Plan Education Issued:  Voc Rehab/Independent Living Info; 11/06/14 Blue putty HEP (grip and pinch), w/c push-up     Consulted and Agree with Plan of Care Patient;Family member/caregiver   Family Member Consulted sisterIvin Booty        Problem List Patient Active Problem List   Diagnosis Date Noted  . 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 Dysuria 09/04/2014  . Hypertension 05/01/2014  . Ingrown right big toenail 05/01/2014  . Hypokalemia 05/01/2014  . GERD (gastroesophageal reflux disease) 02/14/2014  . Dysphagia, pharyngoesophageal phase 02/06/2014  . Type 2 diabetes mellitus without complication 84/53/6468  . Constipation   . Neurogenic bowel   . 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 Metastatic cancer to spine 01/19/2014  . Rotator cuff tendonitis   . Acute postoperative respiratory failure 01/09/2014  . 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 Leg weakness, bilateral 12/13/2013    Quay Burow, OTR/L 11/30/2014, 12:13 PM  [facility] [address] [place], [place], [address] Phone: [phone]   Fax:  [phone]

## 2014-12-05 ENCOUNTER — Ambulatory Visit: Payer: BLUE CROSS/BLUE SHIELD | Admitting: Occupational Therapy

## 2014-12-05 ENCOUNTER — Encounter: Payer: Self-pay | Admitting: Occupational Therapy

## 2014-12-05 DIAGNOSIS — R2991 Unspecified symptoms and signs involving the musculoskeletal system: Secondary | ICD-10-CM

## 2014-12-05 DIAGNOSIS — R29818 Other symptoms and signs involving the nervous system: Secondary | ICD-10-CM

## 2014-12-05 DIAGNOSIS — Z7409 Other reduced mobility: Secondary | ICD-10-CM

## 2014-12-05 DIAGNOSIS — G8220 Paraplegia, unspecified: Secondary | ICD-10-CM

## 2014-12-05 DIAGNOSIS — M6281 Muscle weakness (generalized): Secondary | ICD-10-CM

## 2014-12-05 NOTE — Therapy (Signed)
Sarah [name] [address] [place], [place], [address] Phone: [phone]   Fax:  [phone]  Occupational Therapy Treatment  Patient Details  Name: Sarah [name] MRN: [id number] Date of Birth: Jul 21, 1965 Referring Provider:  Elberta Leatherwood, MD  Encounter Date: 12/05/2014      OT End of Session - 12/05/14 1708    Visit Number 12   Number of Visits 16   Date for OT Re-Evaluation 12/13/14   Authorization Type BCBS primary (62 OT visits) / medicaid secondary (6 OT medicaid visits used, 3 previous BCBS visits used)   Authorization - Visit Number 12   Authorization - Number of Visits 30   OT Start Time 1630  pt arrived late   OT Stop Time 1700   OT Time Calculation (min) 30 min      Past Medical History  Diagnosis Date  . Hypertension   . DDD (degenerative disc disease), cervical   . DJD (degenerative joint disease)   . Obesity   . Chronic back pain   . 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 History of rectal fissure   . Rotator cuff tendonitis right    Past Surgical History  Procedure Laterality Date  . Tonsillectomy    . Laminectomy N/A 12/14/2013    Procedure: THORACIC LAMINECTOMY FOR TUMOR THORACIC THREE;  Surgeon: Ashok Pall, MD;  Location: [facility];  Service: Neurosurgery;  Laterality: N/A;  THORACIC LAMINECTOMY FOR TUMOR THORACIC THREE  . Posterior lumbar fusion 4 level N/A 12/30/2013    Procedure: Thoracic one-Thoracic five posterior thoracic fusion with pedicle screws;  Surgeon: Ashok Pall, MD;  Location: [facility];  Service: Neurosurgery;  Laterality: N/A;  Thoracic one-Thoracic five posterior thoracic fusion with pedicle screws  . Thyroidectomy N/A 01/09/2014    Procedure: TOTAL THYROIDECTOMY;  Surgeon: Izora Gala, MD;  Location: [facility];  Service: ENT;  Laterality: N/A;    There were no vitals filed for this visit.  Visit Diagnosis:   Paraplegia  Decreased functional mobility and endurance  Poor trunk control  Muscle weakness (generalized)      Subjective Assessment - 12/05/14 1633    Subjective  I could bridge this morning to pull up my pants   [name] accompained by: Family member  sister   Pertinent History s/p laminectomy 12/14/13, s/p lumbar fusion 12/30/13 by Dr. Cyndy Freeze (secondary to tumor)   Patient Stated Goals improve ADLs (bathing, toileting)   Currently in Pain? Yes   Pain Score 2    Pain Location Leg   Pain Orientation Right;Left   Pain Descriptors / Indicators Aching  tight   Pain Type Chronic pain   Pain Onset More than a month ago   Pain Frequency Intermittent   Aggravating Factors  nothing that I know of   Pain Relieving Factors tylenol   Multiple Pain Sites Yes   Pain Score 2   Pain Location Back   Pain Orientation Mid   Pain Descriptors / Indicators Aching   Pain Type Chronic pain   Pain Onset More than a month ago   Pain Frequency Intermittent   Aggravating Factors  lay or sit in too long   Pain Relieving Factors getting up and moving, tylenol                      OT Treatments/Exercises (OP) - 12/05/14 0001    Neurological Re-education Exercises  Other Exercises 1 Neuro re ed to address transfers wheelchair to mat and back as well as scooting, sit to stand, static standing with UE support (mod a x2), progressing to brief periods of only 1 UE support in standing (still mod a x2), stand to sit. Pt tolerated standing x3.  Transfering with supevision and assist to position LE's due to placement of foot plates on power chair (pt uses block under feet).                   OT Short Term Goals - 12/05/14 1706    OT SHORT TERM GOAL #1   Title Independent with updated HEP for UE strengthening--check STGs 11/12/14   Baseline dependent   Time 4   Period Weeks   Status Achieved  pt reports she is doing theraband program with sister at home   OT [place] #2    Title Pt will peform UB bathing on edge of bed with min A.   Baseline mod A lying in bed   Status Achieved   OT SHORT TERM GOAL #3   Title Pt to perform transfer to [place] using sliding board with mod A.   Baseline not performing, using bed pan   Time 4   Status Achieved  11/06/14:  min A in clinic, but not performing at home (pt would benefit from bariatic [facility])   OT SHORT TERM GOAL #4   Title Pt will perform LB dressing with mod A using AE prn.   Baseline mod-max A for LB dressing.   Time 4   Status Achieved   OT SHORT TERM GOAL #5   Title Pt will verbalize understanding of appropriate community resources prn.   Baseline dependent   Time 4   Period Weeks   Status Achieved           OT Long Term Goals - 12/05/14 1706    OT LONG TERM GOAL #1   Title Pt will perform UB/LB bathing mod I once transfer to tub bench is completed using AE/DME prn. - 12/08/2014   Baseline dependent LB, bathing in bed   Time 8   Period Weeks   Status On-going   OT LONG TERM GOAL #2   Title Pt will peform sliding board transfer to tub bench with min A.   Baseline sponge bathing in bed, not performin   Time 8   Period Weeks   Status On-going   OT LONG TERM GOAL #3   Title Pt to perform transfer to [facility] using sliding board with min A.  completed without sliding board   Baseline not peforming, using bed pan   Time 8   Period Weeks   Status Achieved   OT LONG TERM GOAL #4   Title Pt will perform toileting hygiene independently after bowel movement using BSC.   Baseline dependent   Time 8   Period Weeks   Status On-going               Plan - 12/05/14 1706    Clinical Impression Statement Pt conitnues to make steady progress with ADL's and functional mobility. Pt with more pain and fatigue this afternoon however motivated to participate.    Pt will benefit from skilled therapeutic intervention in order to improve on the following deficits (Retired) Decreased knowledge of use of  DME;Impaired flexibility;Decreased coordination;Decreased mobility;Improper body mechanics;Decreased endurance;Decreased activity tolerance;Decreased range of motion;Decreased strength;Other (comment);Impaired UE functional use;Decreased balance   Rehab Potential Good  Clinical Impairments Affecting Rehab Potential severity of deficits   OT Frequency 2x / week   OT Duration 8 weeks   OT Treatment/Interventions Self-care/ADL training;Neuromuscular education;Therapeutic exercise;Patient/family education;DME and/or AE instruction;Passive range of motion;Manual Therapy;Therapeutic activities;Cryotherapy;Moist Heat;Energy conservation;Therapist, nutritional;Therapeutic exercises;Ultrasound   Plan sit to stand, active transfers without sliding board, tub bench transfers, standing balance..    OT Home Exercise Plan Education Issued:  Voc Rehab/Independent Living Info; 11/06/14 Blue putty HEP (grip and pinch), w/c push-up     Consulted and Agree with Plan of Care Patient;Family member/caregiver   Family Member Consulted sisterIvin Booty        Problem List Patient Active Problem List   Diagnosis Date Noted  . 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 Dysuria 09/04/2014  . Hypertension 05/01/2014  . Ingrown right big toenail 05/01/2014  . Hypokalemia 05/01/2014  . GERD (gastroesophageal reflux disease) 02/14/2014  . Dysphagia, pharyngoesophageal phase 02/06/2014  . Type 2 diabetes mellitus without complication 09/73/5329  . Constipation   . Neurogenic bowel   . 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 Metastatic cancer to spine 01/19/2014  . Rotator cuff tendonitis   . Acute postoperative respiratory failure 01/09/2014  . 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 Leg weakness, bilateral 12/13/2013    Quay Burow, OTR/L 12/05/2014, 5:09 PM  [facility] [address] [place], [place], [address] Phone: [phone]   Fax:  [phone]

## 2014-12-07 ENCOUNTER — Ambulatory Visit: Payer: BLUE CROSS/BLUE SHIELD | Admitting: Occupational Therapy

## 2014-12-07 ENCOUNTER — Encounter (HOSPITAL_COMMUNITY): Payer: BLUE CROSS/BLUE SHIELD

## 2014-12-12 ENCOUNTER — Ambulatory Visit: Payer: BLUE CROSS/BLUE SHIELD | Admitting: Occupational Therapy

## 2014-12-12 ENCOUNTER — Encounter: Payer: Self-pay | Admitting: Occupational Therapy

## 2014-12-12 DIAGNOSIS — Z7409 Other reduced mobility: Secondary | ICD-10-CM

## 2014-12-12 DIAGNOSIS — R2991 Unspecified symptoms and signs involving the musculoskeletal system: Secondary | ICD-10-CM

## 2014-12-12 DIAGNOSIS — G8220 Paraplegia, unspecified: Secondary | ICD-10-CM

## 2014-12-12 DIAGNOSIS — R29818 Other symptoms and signs involving the nervous system: Secondary | ICD-10-CM

## 2014-12-12 DIAGNOSIS — M6281 Muscle weakness (generalized): Secondary | ICD-10-CM

## 2014-12-12 NOTE — Therapy (Signed)
[facility] [address] [place], [place], [address] Phone: [phone]   Fax:  [phone]  Occupational Therapy Treatment  Patient Details  Name: Sarah [name] MRN: [id number] Date of Birth: 07-18-1965 Referring Provider:  Elberta Leatherwood, MD  Encounter Date: 12/12/2014      OT End of Session - 12/12/14 1732    Visit Number 13   Number of Visits [place] primary (105 OT visits) / medicaid secondary (6 OT medicaid visits used, 3 previous BCBS visits used)   Authorization - Visit Number 13   Authorization - Number of Visits 30   OT Start Time 4627   OT Stop Time 1700   OT Time Calculation (min) 43 min   Activity Tolerance Patient tolerated treatment well      Past Medical History  Diagnosis Date  . Hypertension   . DDD (degenerative disc disease), cervical   . DJD (degenerative joint disease)   . Obesity   . Chronic back pain   . 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 History of rectal fissure   . Rotator cuff tendonitis right    Past Surgical History  Procedure Laterality Date  . Tonsillectomy    . Laminectomy N/A 12/14/2013    Procedure: THORACIC LAMINECTOMY FOR TUMOR THORACIC THREE;  Surgeon: Ashok Pall, MD;  Location: [facility];  Service: Neurosurgery;  Laterality: N/A;  THORACIC LAMINECTOMY FOR TUMOR THORACIC THREE  . Posterior lumbar fusion 4 level N/A 12/30/2013    Procedure: Thoracic one-Thoracic five posterior thoracic fusion with pedicle screws;  Surgeon: Ashok Pall, MD;  Location: [facility];  Service: Neurosurgery;  Laterality: N/A;  Thoracic one-Thoracic five posterior thoracic fusion with pedicle screws  . Thyroidectomy N/A 01/09/2014    Procedure: TOTAL THYROIDECTOMY;  Surgeon: Izora Gala, MD;  Location: [facility];  Service: ENT;  Laterality: N/A;    There were no vitals filed for this visit.  Visit Diagnosis:  Paraplegia - Plan:  Ot plan of care cert/re-cert  Decreased functional mobility and endurance - Plan: Ot plan of care cert/re-cert  Poor trunk control - Plan: Ot plan of care cert/re-cert  Muscle weakness (generalized) - Plan: Ot plan of care cert/re-cert      Subjective Assessment - 12/12/14 1620    Subjective  My sister had to work today so I am here alone but that is ok!   Pertinent History s/p laminectomy 12/14/13, s/p lumbar fusion 12/30/13 by Dr. Cyndy Freeze (secondary to tumor)   Patient Stated Goals improve ADLs (bathing, toileting)   Currently in Pain? No/denies                      OT Treatments/Exercises (OP) - 12/12/14 0001    ADLs   Toileting Discussed 3 in 1 commode with pt as letter of medical necessity for drop arm and larger size has been signed by MD and returned to this therapist. Pt wishes to arrange for [employer] to assist with ordering this piece of equipment. Will forward letter of necessity to [employer].   Neurological Re-education Exercises   Other Exercises 1 Neuro re ed to address scooting forward and backward, transfers to mat, sit to stand, static standing balance, and stand to sit. Pt able to stand with mod a x2 with heavy bilateral UE support from raised EOM.  Pt then able to stand in grab bars with mod a x2 however  progress to standing with two bars (one on each side) with close contact guard of 2 for brief periods of time  and max vc's for alignment and postural control. Also began to address small active weight shfits (lateral  in standing today.                  OT Short Term Goals - 12/12/14 1725    OT SHORT TERM GOAL #1   Title Independent with updated HEP for UE strengthening--check STGs 11/12/14   Baseline dependent   Time 4   Period Weeks   Status Achieved  pt reports she is doing theraband program with sister at home   OT [place] #2   Title Pt will peform UB bathing on edge of bed with min A.   Baseline mod A lying in bed   Status  Achieved   OT SHORT TERM GOAL #3   Title Pt to perform transfer to [facility] using sliding board with mod A.   Baseline not performing, using bed pan   Time 4   Status Achieved  11/06/14:  min A in clinic, but not performing at home (pt would benefit from bariatic [facility])   OT SHORT TERM GOAL #4   Title Pt will perform LB dressing with mod A using AE prn.   Baseline mod-max A for LB dressing.   Time 4   Status Achieved   OT SHORT TERM GOAL #5   Title Pt will verbalize understanding of appropriate community resources prn.   Baseline dependent   Time 4   Period Weeks   Status Achieved   Additional Short Term Goals   Additional Short Term Goals Yes   OT SHORT TERM GOAL #6   Title Pt will be mod I with upgraded HEP - 01/09/2015   Status New   OT SHORT TERM GOAL #7   Title Pt will be supervision with 3 in 1 commode transfers to larger padded commode   Status New   OT SHORT TERM GOAL #8   Title Pt will be mod a for toilet hygiene using larger 3 in 1 commode   Status New           OT Long Term Goals - 12/12/14 1725    OT LONG TERM GOAL #1   Title Pt will perform UB/LB bathing mod I once transfer to tub bench is completed using AE/DME prn. - 12/08/2014   Baseline dependent LB, bathing in bed   Time 8   Period Weeks   Status Deferred  pt is unable to access her bathroom at home   OT LONG TERM GOAL #2   Title Pt will peform sliding board transfer to tub bench with min A.   Baseline sponge bathing in bed, not performin   Time 8   Period Weeks   Status Achieved   OT LONG TERM GOAL #3   Title Pt to perform transfer to [facility] using sliding board with min A.  completed without sliding board   Baseline not peforming, using bed pan   Time 8   Period Weeks   Status Achieved   OT LONG TERM GOAL #4   Title Pt will perform toileting hygiene independently after bowel movement using BSC.- 02/06/2015   Baseline dependent   Time 8   Period Weeks   Status On-going   OT LONG TERM GOAL #5    Title Pt will stand with min a to hike pants for dressing using grab  bar   Status New   Long Term Additional Goals   Additional Long Term Goals Yes   OT LONG TERM GOAL #6   Title Pt will demonstrate ability to stand  with min a using only unilateral UE support in order to free one UE for simple functional tasks.   Status New   OT LONG TERM GOAL #7   Title Pt will be mod I with upgraded HEP    Status New               Plan - 12/12/14 1729    Clinical Impression Statement Pt continues to make great progress especially with functional mobility. Pt is completing all bathing and dressing at EOB and in bed for some activities (hiking pants). Pt is beginning to be able to work on sit to  standing balance.   Pt will benefit from skilled therapeutic intervention in order to improve on the following deficits (Retired) Decreased knowledge of use of DME;Impaired flexibility;Decreased coordination;Decreased mobility;Improper body mechanics;Decreased endurance;Decreased activity tolerance;Decreased range of motion;Decreased strength;Other (comment);Impaired UE functional use;Decreased balance   Rehab Potential Good   Clinical Impairments Affecting Rehab Potential severity of deficits   OT Frequency 2x / week   OT Duration 8 weeks   OT Treatment/Interventions Self-care/ADL training;Neuromuscular education;Therapeutic exercise;Patient/family education;DME and/or AE instruction;Passive range of motion;Manual Therapy;Therapeutic activities;Cryotherapy;Moist Heat;Energy conservation;Therapist, nutritional;Therapeutic exercises;Ultrasound   Plan sit to stand, active multi squat transfers, static standing balance,    OT Home Exercise Plan Education Issued:  Voc Rehab/Independent Living Info; 11/06/14 Blue putty HEP (grip and pinch), w/c push-up     Consulted and Agree with Plan of Care Patient        Problem List Patient Active Problem List   Diagnosis Date Noted  . Spastic paraplegia  09/19/2014  . Dysuria 09/04/2014  . Hypertension 05/01/2014  . Ingrown right big toenail 05/01/2014  . Hypokalemia 05/01/2014  . GERD (gastroesophageal reflux disease) 02/14/2014  . Dysphagia, pharyngoesophageal phase 02/06/2014  . Type 2 diabetes mellitus without complication 61/44/3154  . Constipation   . Neurogenic bowel   . 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 Metastatic cancer to spine 01/19/2014  . Rotator cuff tendonitis   . Acute postoperative respiratory failure 01/09/2014  . 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 Leg weakness, bilateral 12/13/2013    Quay Burow, OTR/L 12/12/2014, 5:44 PM  [facility] [address] [place], [place], [address] Phone: [phone]   Fax:  [phone]

## 2014-12-14 ENCOUNTER — Ambulatory Visit: Payer: BLUE CROSS/BLUE SHIELD | Admitting: Occupational Therapy

## 2014-12-14 ENCOUNTER — Encounter: Payer: Self-pay | Admitting: Occupational Therapy

## 2014-12-14 DIAGNOSIS — R29818 Other symptoms and signs involving the nervous system: Secondary | ICD-10-CM

## 2014-12-14 DIAGNOSIS — R2991 Unspecified symptoms and signs involving the musculoskeletal system: Secondary | ICD-10-CM | POA: Diagnosis not present

## 2014-12-14 DIAGNOSIS — G8220 Paraplegia, unspecified: Secondary | ICD-10-CM

## 2014-12-14 DIAGNOSIS — Z7409 Other reduced mobility: Secondary | ICD-10-CM

## 2014-12-14 DIAGNOSIS — M6281 Muscle weakness (generalized): Secondary | ICD-10-CM

## 2014-12-14 NOTE — Therapy (Signed)
[facility] [address] [place], [place], [address] Phone: [phone]   Fax:  [phone]  Occupational Therapy Treatment  Patient Details  Name: CAMIE HAUSS MRN: [id number] Date of Birth: 03-May-1965 Referring Provider:  Elberta Leatherwood, MD  Encounter Date: 12/14/2014      OT End of Session - 12/14/14 1716    Visit Number 14   Number of Visits 30   Date for OT Re-Evaluation 02/06/15   Authorization Type BCBS primary (37 OT visits) / medicaid secondary (6 OT medicaid visits used, 3 previous BCBS visits used)   Authorization - Visit Number 14   Authorization - Number of Visits 30   OT Start Time 1618   OT Stop Time 1702   OT Time Calculation (min) 44 min   Activity Tolerance Patient tolerated treatment well      Past Medical History  Diagnosis Date  . Hypertension   . DDD (degenerative disc disease), cervical   . DJD (degenerative joint disease)   . Obesity   . Chronic back pain   . 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 History of rectal fissure   . Rotator cuff tendonitis right    Past Surgical History  Procedure Laterality Date  . Tonsillectomy    . Laminectomy N/A 12/14/2013    Procedure: THORACIC LAMINECTOMY FOR TUMOR THORACIC THREE;  Surgeon: Ashok Pall, MD;  Location: [facility];  Service: Neurosurgery;  Laterality: N/A;  THORACIC LAMINECTOMY FOR TUMOR THORACIC THREE  . Posterior lumbar fusion 4 level N/A 12/30/2013    Procedure: Thoracic one-Thoracic five posterior thoracic fusion with pedicle screws;  Surgeon: Ashok Pall, MD;  Location: [facility];  Service: Neurosurgery;  Laterality: N/A;  Thoracic one-Thoracic five posterior thoracic fusion with pedicle screws  . Thyroidectomy N/A 01/09/2014    Procedure: TOTAL THYROIDECTOMY;  Surgeon: Izora Gala, MD;  Location: [facility];  Service: ENT;  Laterality: N/A;    There were no vitals filed for this  visit.  Visit Diagnosis:  Decreased functional mobility and endurance  Paraplegia  Poor trunk control  Muscle weakness (generalized)      Subjective Assessment - 12/14/14 1623    Pertinent History s/p laminectomy 12/14/13, s/p lumbar fusion 12/30/13 by Dr. Cyndy Freeze (secondary to tumor)   Patient Stated Goals improve ADLs (bathing, toileting)   Currently in Pain? No/denies                      OT Treatments/Exercises (OP) - 12/14/14 0001    Neurological Re-education Exercises   Other Exercises 1 Neuro re ed to address sit to stand, static standing balance, beginning lateral weight shifting, stand to sit using grab bar in front. Pt requires max a x2 for sit to stand from lower surface and mod a x2 from higher surface. Once up in standing pt needs min a x2  to very brief close contact guard and mod vc's for alignment. Alignment is critical for pt to be able to activate LE's in standing. Pt highly depdendent on UE support at this time.                  OT Short Term Goals - 12/14/14 1710    OT SHORT TERM GOAL #1   Baseline dependent   Time 4   Period Weeks   Status --  pt reports she is doing theraband program with sister at home  OT SHORT TERM GOAL #2   Baseline mod A lying in bed   OT SHORT TERM GOAL #3   Baseline not performing, using bed pan   Time 4   Status --  11/06/14:  min A in clinic, but not performing at home (pt would benefit from bariatic [facility])   OT SHORT TERM GOAL #4   Baseline mod-max A for LB dressing.   Time 4   OT SHORT TERM GOAL #5   Baseline dependent   Time 4   Period Weeks   OT SHORT TERM GOAL #6   Title Pt will be mod I with upgraded HEP - 01/09/2015   Status On-going   OT SHORT TERM GOAL #7   Title Pt will be supervision with 3 in 1 commode transfers to larger padded commode   Status On-going   OT SHORT TERM GOAL #8   Title Pt will be mod a for toilet hygiene using larger 3 in 1 commode   Status On-going           OT  Long Term Goals - 12/14/14 1711    OT LONG TERM GOAL #1   Baseline dependent LB, bathing in bed   Time 8   Period Weeks   OT LONG TERM GOAL #2   Baseline sponge bathing in bed, not performin   Time 8   Period Weeks   OT LONG TERM GOAL #3   Title --  completed without sliding board   Baseline not peforming, using bed pan   Time 8   Period Weeks   Status --   OT LONG TERM GOAL #4   Title Pt will perform toileting hygiene independently after bowel movement using BSC.02/06/2015   Baseline dependent   Time 8   Period Weeks   Status On-going   OT LONG TERM GOAL #5   Title Pt will stand with min a to hike pants for dressing using grab bar   OT LONG TERM GOAL #6   Title Pt will demonstrate ability to stand  with min a using only unilateral UE support in order to free one UE for simple functional tasks.   OT LONG TERM GOAL #7   Title Pt will be mod I with upgraded HEP                Plan - 12/14/14 1714    Clinical Impression Statement Pt making slow but steady progress in functional mobility. Pt much more comfortable with active scooting to edge of surface and is now consistently able to use UE and LE to lift bottom to scoot and transfer. Pt also able to work through fear to work on sit to stand and standing balance    Pt will benefit from skilled therapeutic intervention in order to improve on the following deficits (Retired) Decreased knowledge of use of DME;Impaired flexibility;Decreased coordination;Decreased mobility;Improper body mechanics;Decreased endurance;Decreased activity tolerance;Decreased range of motion;Decreased strength;Other (comment);Impaired UE functional use;Decreased balance   Rehab Potential Good   Clinical Impairments Affecting Rehab Potential severity of deficits   OT Frequency 2x / week   OT Duration 8 weeks   OT Treatment/Interventions Self-care/ADL training;Neuromuscular education;Therapeutic exercise;Patient/family education;DME and/or AE  instruction;Passive range of motion;Manual Therapy;Therapeutic activities;Cryotherapy;Moist Heat;Energy conservation;Therapist, nutritional;Therapeutic exercises;Ultrasound   Plan sit to stand, active multi squat transfers, static standing balance, weight shfting in standing, begin to work toward decreasing reliance on UE's   Consulted and Agree with Plan of Care Patient  Problem List Patient Active Problem List   Diagnosis Date Noted  . 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 Dysuria 09/04/2014  . Hypertension 05/01/2014  . Ingrown right big toenail 05/01/2014  . Hypokalemia 05/01/2014  . GERD (gastroesophageal reflux disease) 02/14/2014  . Dysphagia, pharyngoesophageal phase 02/06/2014  . Type 2 diabetes mellitus without complication 03/83/3383  . Constipation   . Neurogenic bowel   . 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 Metastatic cancer to spine 01/19/2014  . Rotator cuff tendonitis   . Acute postoperative respiratory failure 01/09/2014  . 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 Leg weakness, bilateral 12/13/2013    Quay Burow, OTR/L 12/14/2014, 5:18 PM  [facility] [address] [place], [place], [address] Phone: [phone]   Fax:  [phone]

## 2014-12-16 ENCOUNTER — Other Ambulatory Visit: Payer: Self-pay | Admitting: Family Medicine

## 2014-12-19 ENCOUNTER — Encounter: Payer: Self-pay | Admitting: Occupational Therapy

## 2014-12-19 ENCOUNTER — Ambulatory Visit: Payer: BLUE CROSS/BLUE SHIELD | Admitting: Occupational Therapy

## 2014-12-19 DIAGNOSIS — Z7409 Other reduced mobility: Secondary | ICD-10-CM

## 2014-12-19 DIAGNOSIS — M6281 Muscle weakness (generalized): Secondary | ICD-10-CM

## 2014-12-19 DIAGNOSIS — R2991 Unspecified symptoms and signs involving the musculoskeletal system: Secondary | ICD-10-CM

## 2014-12-19 DIAGNOSIS — G8220 Paraplegia, unspecified: Secondary | ICD-10-CM

## 2014-12-19 DIAGNOSIS — R29818 Other symptoms and signs involving the nervous system: Secondary | ICD-10-CM

## 2014-12-19 NOTE — Therapy (Signed)
[facility] [address] [place], [place], [address] Phone: [phone]   Fax:  [phone]  Occupational Therapy Treatment  Patient Details  Name: Sarah [name] MRN: [id number] Date of Birth: May 09, 1965 Referring Provider:  Elberta Leatherwood, MD  Encounter Date: 12/19/2014      OT End of Session - 12/19/14 1739    Visit Number 15   Number of Visits 30   Date for OT Re-Evaluation 02/06/15   Authorization Type BCBS primary (47 OT visits) / medicaid secondary (6 OT medicaid visits used, 3 previous BCBS visits used)   Authorization - Visit Number 15   Authorization - Number of Visits 30   OT Start Time 1617   OT Stop Time 1700   OT Time Calculation (min) 43 min   Activity Tolerance Patient tolerated treatment well      Past Medical History  Diagnosis Date  . Hypertension   . DDD (degenerative disc disease), cervical   . DJD (degenerative joint disease)   . Obesity   . Chronic back pain   . 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 History of rectal fissure   . Rotator cuff tendonitis right    Past Surgical History  Procedure Laterality Date  . Tonsillectomy    . Laminectomy N/A 12/14/2013    Procedure: THORACIC LAMINECTOMY FOR TUMOR THORACIC THREE;  Surgeon: Ashok Pall, MD;  Location: [facility];  Service: Neurosurgery;  Laterality: N/A;  THORACIC LAMINECTOMY FOR TUMOR THORACIC THREE  . Posterior lumbar fusion 4 level N/A 12/30/2013    Procedure: Thoracic one-Thoracic five posterior thoracic fusion with pedicle screws;  Surgeon: Ashok Pall, MD;  Location: [facility];  Service: Neurosurgery;  Laterality: N/A;  Thoracic one-Thoracic five posterior thoracic fusion with pedicle screws  . Thyroidectomy N/A 01/09/2014    Procedure: TOTAL THYROIDECTOMY;  Surgeon: Izora Gala, MD;  Location: [facility];  Service: ENT;  Laterality: N/A;    There were no vitals filed for this  visit.  Visit Diagnosis:  Decreased functional mobility and endurance  Paraplegia  Poor trunk control  Muscle weakness (generalized)      Subjective Assessment - 12/19/14 1734    Subjective  I am geting out of bed with just someone standing by (using hospital bed)   [name] accompained by: Family member  sister   Pertinent History s/p laminectomy 12/14/13, s/p lumbar fusion 12/30/13 by Dr. Cyndy Freeze (secondary to tumor)   Currently in Pain? No/denies                      OT Treatments/Exercises (OP) - 12/19/14 0001    Neurological Re-education Exercises   Other Exercises 1 Neuro re ed to address sit to stand - pt needed only min a x2 today using grab bar to stand 3 times. Pt able to very briefly let go with one hand on grab bar in standing.  Also addressed trunk and hip control in quadraped moving to high kneeling - pt needed mox a x2 achieve position but then able to maintain with min a x1 using UE support on solid surface.                   OT Short Term Goals - 12/19/14 1737    OT SHORT TERM GOAL #1   Title Independent with updated HEP for UE strengthening--check STGs 11/12/14   Baseline dependent   Time 4  Period Weeks   Status Achieved  pt reports she is doing theraband program with sister at home   OT [place] #2   Title Pt will peform UB bathing on edge of bed with min A.   Baseline mod A lying in bed   Status Achieved   OT SHORT TERM GOAL #3   Title Pt to perform transfer to [facility] using sliding board with mod A.   Baseline not performing, using bed pan   Time 4   Status Achieved  11/06/14:  min A in clinic, but not performing at home (pt would benefit from bariatic [facility])   OT SHORT TERM GOAL #4   Title Pt will perform LB dressing with mod A using AE prn.   Baseline mod-max A for LB dressing.   Time 4   Status Achieved   OT SHORT TERM GOAL #5   Title Pt will verbalize understanding of appropriate community resources prn.   Baseline  dependent   Time 4   Period Weeks   Status Achieved   OT SHORT TERM GOAL #6   Title Pt will be mod I with upgraded HEP - 01/09/2015   Status New   OT SHORT TERM GOAL #7   Title Pt will be supervision with 3 in 1 commode transfers to larger padded commode   Status New   OT SHORT TERM GOAL #8   Title Pt will be mod a for toilet hygiene using larger 3 in 1 commode   Status New           OT Long Term Goals - 12/19/14 1737    OT LONG TERM GOAL #1   Title Pt will perform UB/LB bathing mod I once transfer to tub bench is completed using AE/DME prn. - 12/08/2014   Baseline dependent LB, bathing in bed   Time 8   Period Weeks   Status Deferred  pt is unable to access her bathroom at home   OT LONG TERM GOAL #2   Title Pt will peform sliding board transfer to tub bench with min A.   Baseline sponge bathing in bed, not performin   Time 8   Period Weeks   Status Achieved   OT LONG TERM GOAL #3   Title Pt to perform transfer to [facility] using sliding board with min A.  completed without sliding board   Baseline not peforming, using bed pan   Time 8   Period Weeks   Status Achieved   OT LONG TERM GOAL #4   Title Pt will perform toileting hygiene independently after bowel movement using BSC.- 02/06/2015   Baseline dependent   Time 8   Period Weeks   Status On-going   OT LONG TERM GOAL #5   Title Pt will stand with min a to hike pants for dressing using grab bar   Status New   OT LONG TERM GOAL #6   Title Pt will demonstrate ability to stand  with min a using only unilateral UE support in order to free one UE for simple functional tasks.   Status New   OT LONG TERM GOAL #7   Title Pt will be mod I with upgraded HEP    Status New               Plan - 12/19/14 1737    Clinical Impression Statement Pt making great gains and becoming more confident in her ability. Pt reports she returned hoyer lift yesterday and is  able to do far more on her own at home. Sister in agreement.    Pt will benefit from skilled therapeutic intervention in order to improve on the following deficits (Retired) Decreased knowledge of use of DME;Impaired flexibility;Decreased coordination;Decreased mobility;Improper body mechanics;Decreased endurance;Decreased activity tolerance;Decreased range of motion;Decreased strength;Other (comment);Impaired UE functional use;Decreased balance   Rehab Potential Good   Clinical Impairments Affecting Rehab Potential severity of deficits   OT Frequency 2x / week   OT Duration 8 weeks   OT Treatment/Interventions Self-care/ADL training;Neuromuscular education;Therapeutic exercise;Patient/family education;DME and/or AE instruction;Passive range of motion;Manual Therapy;Therapeutic activities;Cryotherapy;Moist Heat;Energy conservation;Therapist, nutritional;Therapeutic exercises;Ultrasound   Plan  sit to stand, stand to sit, weight shifting, trunk control, progressing toward only 1 UE support in standing   OT Home Exercise Plan Education Issued:  Voc Rehab/Independent Living Info; 11/06/14 Blue putty HEP (grip and pinch), w/c push-up     Consulted and Agree with Plan of Care Patient   Family Member Consulted sisterIvin [name]        Problem List Patient Active Problem List   Diagnosis Date Noted  . 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 Dysuria 09/04/2014  . Hypertension 05/01/2014  . Ingrown right big toenail 05/01/2014  . Hypokalemia 05/01/2014  . GERD (gastroesophageal reflux disease) 02/14/2014  . Dysphagia, pharyngoesophageal phase 02/06/2014  . Type 2 diabetes mellitus without complication 44/92/0100  . Constipation   . Neurogenic bowel   . 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 Metastatic cancer to spine 01/19/2014  . Rotator cuff tendonitis   . Acute postoperative respiratory failure 01/09/2014  . 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 Leg weakness, bilateral 12/13/2013    Quay Burow, OTR/L 12/19/2014, 5:41 PM  [facility] [address] [place], [place], [address] Phone: [phone]   Fax:  [phone]

## 2014-12-20 ENCOUNTER — Ambulatory Visit: Payer: BLUE CROSS/BLUE SHIELD | Admitting: Physical Medicine & Rehabilitation

## 2014-12-21 ENCOUNTER — Ambulatory Visit: Payer: BLUE CROSS/BLUE SHIELD | Admitting: Occupational Therapy

## 2014-12-21 ENCOUNTER — Encounter: Payer: Self-pay | Admitting: Occupational Therapy

## 2014-12-21 DIAGNOSIS — R2991 Unspecified symptoms and signs involving the musculoskeletal system: Secondary | ICD-10-CM | POA: Diagnosis not present

## 2014-12-21 DIAGNOSIS — Z7409 Other reduced mobility: Secondary | ICD-10-CM

## 2014-12-21 DIAGNOSIS — G8220 Paraplegia, unspecified: Secondary | ICD-10-CM

## 2014-12-21 DIAGNOSIS — R29818 Other symptoms and signs involving the nervous system: Secondary | ICD-10-CM

## 2014-12-21 DIAGNOSIS — M6281 Muscle weakness (generalized): Secondary | ICD-10-CM

## 2014-12-21 NOTE — Therapy (Signed)
[facility] [address] [place], [place], [address] Phone: [phone]   Fax:  [phone]  Occupational Therapy Treatment  Patient Details  Name: Sarah [name] MRN: [id number] Date of Birth: 07-04-1965 Referring Provider:  Elberta Leatherwood, MD  Encounter Date: 12/21/2014      OT End of Session - 12/21/14 1747    Visit Number 16   Number of Visits 30   Date for OT Re-Evaluation 02/06/15   Authorization Type BCBS primary (15 OT visits) / medicaid secondary (6 OT medicaid visits used, 3 previous BCBS visits used)   OT Start Time 1620   OT Stop Time 1702   OT Time Calculation (min) 42 min   Activity Tolerance Patient tolerated treatment well      Past Medical History  Diagnosis Date  . Hypertension   . DDD (degenerative disc disease), cervical   . DJD (degenerative joint disease)   . Obesity   . Chronic back pain   . 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 History of rectal fissure   . Rotator cuff tendonitis right    Past Surgical History  Procedure Laterality Date  . Tonsillectomy    . Laminectomy N/A 12/14/2013    Procedure: THORACIC LAMINECTOMY FOR TUMOR THORACIC THREE;  Surgeon: Ashok Pall, MD;  Location: [facility];  Service: Neurosurgery;  Laterality: N/A;  THORACIC LAMINECTOMY FOR TUMOR THORACIC THREE  . Posterior lumbar fusion 4 level N/A 12/30/2013    Procedure: Thoracic one-Thoracic five posterior thoracic fusion with pedicle screws;  Surgeon: Ashok Pall, MD;  Location: [facility];  Service: Neurosurgery;  Laterality: N/A;  Thoracic one-Thoracic five posterior thoracic fusion with pedicle screws  . Thyroidectomy N/A 01/09/2014    Procedure: TOTAL THYROIDECTOMY;  Surgeon: Izora Gala, MD;  Location: [facility];  Service: ENT;  Laterality: N/A;    There were no vitals filed for this visit.  Visit Diagnosis:  Decreased functional mobility and  endurance  Paraplegia  Poor trunk control  Muscle weakness (generalized)      Subjective Assessment - 12/21/14 1627    Subjective  My legs feel funny today   [name] accompained by: Family member  sister   Pertinent History s/p laminectomy 12/14/13, s/p lumbar fusion 12/30/13 by Dr. Cyndy Freeze (secondary to tumor)   Patient Stated Goals improve ADLs (bathing, toileting)   Currently in Pain? No/denies  legs feel stiff not truly sore per pt                      OT Treatments/Exercises (OP) - 12/21/14 0001    Neurological Re-education Exercises   Other Exercises 1 Neuro re ed for sit to stand, standing balance (static and some dynamic), stand to sit. Also addressed unweighting RUE in standing for simple reaching activity in preparation for standing and ability to free up RUE for function in standing. Pt more fearful today and required encouragement but was finaly able to stack 6 cones with RUE while in standing and using LUE to stabilize on bar. Heavy emphasis on aligment to enourage activation of LE's and trunk                  OT Short Term Goals - 12/21/14 1745    OT SHORT TERM GOAL #1   Title Independent with updated HEP for UE strengthening--check STGs 11/12/14   Baseline dependent   Time 4   Period Weeks  Status Achieved  pt reports she is doing theraband program with sister at home   OT [place] #2   Title Pt will peform UB bathing on edge of bed with min A.   Baseline mod A lying in bed   Status Achieved   OT SHORT TERM GOAL #3   Title Pt to perform transfer to [facility] using sliding board with mod A.   Baseline not performing, using bed pan   Time 4   Status Achieved  11/06/14:  min A in clinic, but not performing at home (pt would benefit from bariatic [facility])   OT SHORT TERM GOAL #4   Title Pt will perform LB dressing with mod A using AE prn.   Baseline mod-max A for LB dressing.   Time 4   Status Achieved   OT SHORT TERM GOAL #5   Title  Pt will verbalize understanding of appropriate community resources prn.   Baseline dependent   Time 4   Period Weeks   Status Achieved   OT SHORT TERM GOAL #6   Title Pt will be mod I with upgraded HEP - 01/09/2015   Status On-going   OT SHORT TERM GOAL #7   Title Pt will be supervision with 3 in 1 commode transfers to larger padded commode   Status On-going   OT SHORT TERM GOAL #8   Title Pt will be mod a for toilet hygiene using larger 3 in 1 commode   Status On-going           OT Long Term Goals - 12/21/14 1745    OT LONG TERM GOAL #1   Title Pt will perform UB/LB bathing mod I once transfer to tub bench is completed using AE/DME prn. - 12/08/2014   Baseline dependent LB, bathing in bed   Time 8   Period Weeks   Status Deferred  pt is unable to access her bathroom at home   OT LONG TERM GOAL #2   Title Pt will peform sliding board transfer to tub bench with min A.   Baseline sponge bathing in bed, not performin   Time 8   Period Weeks   Status Achieved   OT LONG TERM GOAL #3   Title Pt to perform transfer to [facility] using sliding board with min A.  completed without sliding board   Baseline not peforming, using bed pan   Time 8   Period Weeks   Status Achieved   OT LONG TERM GOAL #4   Title Pt will perform toileting hygiene independently after bowel movement using BSC.- 02/06/2015   Baseline dependent   Time 8   Period Weeks   Status On-going   OT LONG TERM GOAL #5   Title Pt will stand with min a to hike pants for dressing using grab bar   Status On-going   OT LONG TERM GOAL #6   Title Pt will demonstrate ability to stand  with min a using only unilateral UE support in order to free one UE for simple functional tasks.   Status On-going   OT LONG TERM GOAL #7   Title Pt will be mod I with upgraded HEP    Status On-going               Plan - 12/21/14 1745    Clinical Impression Statement Pt continues to make good gains toward goals. Pt gaining in  functional mobility and increasing independence in self care.    Pt  will benefit from skilled therapeutic intervention in order to improve on the following deficits (Retired) Decreased knowledge of use of DME;Impaired flexibility;Decreased coordination;Decreased mobility;Improper body mechanics;Decreased endurance;Decreased activity tolerance;Decreased range of motion;Decreased strength;Other (comment);Impaired UE functional use;Decreased balance   Rehab Potential Good   Clinical Impairments Affecting Rehab Potential severity of deficits   OT Frequency 2x / week   OT Duration 8 weeks   OT Treatment/Interventions Self-care/ADL training;Neuromuscular education;Therapeutic exercise;Patient/family education;DME and/or AE instruction;Passive range of motion;Manual Therapy;Therapeutic activities;Cryotherapy;Moist Heat;Energy conservation;Therapist, nutritional;Therapeutic exercises;Ultrasound   Plan sit to stand, standing with emphasis of freeing up one UE support for functional task, trunk control   OT Home Exercise Plan Education Issued:  Voc Rehab/Independent Living Info; 11/06/14 Blue putty HEP (grip and pinch), w/c push-up     Consulted and Agree with Plan of Care Patient;Family member/caregiver   Family Member Consulted sisterIvin Booty        Problem List Patient Active Problem List   Diagnosis Date Noted  . 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 Dysuria 09/04/2014  . Hypertension 05/01/2014  . Ingrown right big toenail 05/01/2014  . Hypokalemia 05/01/2014  . GERD (gastroesophageal reflux disease) 02/14/2014  . Dysphagia, pharyngoesophageal phase 02/06/2014  . Type 2 diabetes mellitus without complication 24/58/0998  . Constipation   . Neurogenic bowel   . 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 Metastatic cancer to spine 01/19/2014  . Rotator cuff tendonitis   . Acute postoperative respiratory failure 01/09/2014  . 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 Leg weakness, bilateral 12/13/2013    Quay Burow, OTR/L 12/21/2014, 5:48 PM  [facility] [address] [place], [place], [address] Phone: [phone]   Fax:  [phone]

## 2014-12-22 ENCOUNTER — Encounter (HOSPITAL_COMMUNITY)
Admission: RE | Admit: 2014-12-22 | Discharge: 2014-12-22 | Disposition: A | Discharge status: Home / Self Care | Payer: BLUE CROSS/BLUE SHIELD | Source: Ambulatory Visit | Attending: Internal Medicine | Admitting: Internal Medicine

## 2014-12-22 DIAGNOSIS — C73 Malignant neoplasm of thyroid gland: Secondary | ICD-10-CM | POA: Insufficient documentation

## 2014-12-22 LAB — GLUCOSE, CAPILLARY: GLUCOSE-CAPILLARY: 95 mg/dL (ref 65–99)

## 2014-12-22 MED ORDER — FLUDEOXYGLUCOSE F - 18 (FDG) INJECTION
13.9500 | Freq: Once | INTRAVENOUS | Status: DC | PRN
  Administered 2014-12-22: 13.95 via INTRAVENOUS
  Filled 2014-12-22: qty 13.95

## 2014-12-25 ENCOUNTER — Ambulatory Visit: Payer: BLUE CROSS/BLUE SHIELD | Attending: Family Medicine | Admitting: Occupational Therapy

## 2014-12-25 ENCOUNTER — Encounter: Payer: Self-pay | Admitting: Occupational Therapy

## 2014-12-25 DIAGNOSIS — G8220 Paraplegia, unspecified: Secondary | ICD-10-CM | POA: Insufficient documentation

## 2014-12-25 DIAGNOSIS — Z7409 Other reduced mobility: Secondary | ICD-10-CM | POA: Diagnosis not present

## 2014-12-25 DIAGNOSIS — M25619 Stiffness of unspecified shoulder, not elsewhere classified: Secondary | ICD-10-CM | POA: Insufficient documentation

## 2014-12-25 DIAGNOSIS — R29818 Other symptoms and signs involving the nervous system: Secondary | ICD-10-CM

## 2014-12-25 DIAGNOSIS — R2991 Unspecified symptoms and signs involving the musculoskeletal system: Secondary | ICD-10-CM | POA: Diagnosis present

## 2014-12-25 DIAGNOSIS — M6281 Muscle weakness (generalized): Secondary | ICD-10-CM | POA: Diagnosis present

## 2014-12-25 NOTE — Therapy (Signed)
[facility] [address] [place], [place], [address] Phone: [phone]   Fax:  [phone]  Occupational Therapy Treatment  Patient Details  Name: Sarah [name] MRN: [id number] Date of Birth: February 12, 1966 Referring Provider:  Elberta Leatherwood, MD  Encounter Date: 12/25/2014      OT End of Session - 12/25/14 1313    Visit Number 17   Number of Visits 30   Date for OT Re-Evaluation 02/06/15   Authorization Type BCBS primary (10 OT visits) / medicaid secondary (6 OT medicaid visits used, 3 previous BCBS visits used)   OT Start Time 1148   OT Stop Time 1230   OT Time Calculation (min) 42 min   Activity Tolerance Patient tolerated treatment well      Past Medical History  Diagnosis Date  . Hypertension   . DDD (degenerative disc disease), cervical   . DJD (degenerative joint disease)   . Obesity   . Chronic back pain   . 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 History of rectal fissure   . Rotator cuff tendonitis right    Past Surgical History  Procedure Laterality Date  . Tonsillectomy    . Laminectomy N/A 12/14/2013    Procedure: THORACIC LAMINECTOMY FOR TUMOR THORACIC THREE;  Surgeon: Ashok Pall, MD;  Location: [place] [facility];  Service: Neurosurgery;  Laterality: N/A;  THORACIC LAMINECTOMY FOR TUMOR THORACIC THREE  . Posterior lumbar fusion 4 level N/A 12/30/2013    Procedure: Thoracic one-Thoracic five posterior thoracic fusion with pedicle screws;  Surgeon: Ashok Pall, MD;  Location: [place] [facility];  Service: Neurosurgery;  Laterality: N/A;  Thoracic one-Thoracic five posterior thoracic fusion with pedicle screws  . Thyroidectomy N/A 01/09/2014    Procedure: TOTAL THYROIDECTOMY;  Surgeon: Izora Gala, MD;  Location: [facility];  Service: ENT;  Laterality: N/A;    There were no vitals filed for this visit.  Visit Diagnosis:  Decreased functional mobility and  endurance  Paraplegia (HCC)  Poor trunk control  Muscle weakness (generalized)      Subjective Assessment - 12/25/14 1207    Subjective  I go see Dr. Naaman Plummer tomorrow   [name] accompained by: Family member  sister   Pertinent History s/p laminectomy 12/14/13, s/p lumbar fusion 12/30/13 by Dr. Cyndy Freeze (secondary to tumor)   Currently in Pain? No/denies  stiffness in legs but no pain                      OT Treatments/Exercises (OP) - 12/25/14 0001    ADLs   ADL Comments Informed pt that  letter of medical necessity for bariatric commode seat was hand delivered to [facility] (pt chose agency) - pt instructed to call with insurance money. Also encouraged pt and sister to follow up with voc rehab services to inquire about possible funds to widen bathroom door and possibly remodel shower. Both in agreement.    Neurological Re-education Exercises   Other Exercises 1 Neuro re ed to address sit to stand, standing balance (static and mild dynamic), with emphasis on longer periods of time in standing using only one UE support - pt able to stack multiple cones today and have one UE out of support for up to 20 seconds. Working toward pt's ability to free one hand up in standing to assist with ADL's. Also addresed active stand to sit. Pt varies from min - mod a  x2 depending on fatugue.                   OT Short Term Goals - 12/25/14 1311    OT SHORT TERM GOAL #1   Title Independent with updated HEP for UE strengthening--check STGs 11/12/14   Baseline dependent   Time 4   Period Weeks   Status Achieved  pt reports she is doing theraband program with sister at home   OT [place] #2   Title Pt will peform UB bathing on edge of bed with min A.   Baseline mod A lying in bed   Status Achieved   OT SHORT TERM GOAL #3   Title Pt to perform transfer to [facility] using sliding board with mod A.   Baseline not performing, using bed pan   Time 4   Status Achieved   11/06/14:  min A in clinic, but not performing at home (pt would benefit from bariatic [facility])   OT SHORT TERM GOAL #4   Title Pt will perform LB dressing with mod A using AE prn.   Baseline mod-max A for LB dressing.   Time 4   Status Achieved   OT SHORT TERM GOAL #5   Title Pt will verbalize understanding of appropriate community resources prn.   Baseline dependent   Time 4   Period Weeks   Status Achieved   OT SHORT TERM GOAL #6   Title Pt will be mod I with upgraded HEP - 01/09/2015   Status On-going   OT SHORT TERM GOAL #7   Title Pt will be supervision with 3 in 1 commode transfers to larger padded commode   Status On-going   OT SHORT TERM GOAL #8   Title Pt will be mod a for toilet hygiene using larger 3 in 1 commode   Status On-going           OT Long Term Goals - 12/25/14 1312    OT LONG TERM GOAL #1   Title Pt will perform UB/LB bathing mod I once transfer to tub bench is completed using AE/DME prn. - 12/08/2014   Baseline dependent LB, bathing in bed   Time 8   Period Weeks   Status Deferred  pt is unable to access her bathroom at home   OT LONG TERM GOAL #2   Title Pt will peform sliding board transfer to tub bench with min A.   Baseline sponge bathing in bed, not performin   Time 8   Period Weeks   Status Achieved   OT LONG TERM GOAL #3   Title Pt to perform transfer to [facility] using sliding board with min A.  completed without sliding board   Baseline not peforming, using bed pan   Time 8   Period Weeks   Status Achieved   OT LONG TERM GOAL #4   Title Pt will perform toileting hygiene independently after bowel movement using BSC.- 02/06/2015   Baseline dependent   Time 8   Period Weeks   Status On-going   OT LONG TERM GOAL #5   Title Pt will stand with min a to hike pants for dressing using grab bar   Status On-going   OT LONG TERM GOAL #6   Title Pt will demonstrate ability to stand  with min a using only unilateral UE support in order to free one UE  for simple functional tasks.   Status On-going   OT LONG TERM GOAL #7  Title Pt will be mod I with upgraded HEP    Status On-going               Plan - 12/25/14 1312    Clinical Impression Statement Pt continues to make excellent progress toward goals and has greatly improved in functional mobility as well as decreasing fear of movement.    Pt will benefit from skilled therapeutic intervention in order to improve on the following deficits (Retired) Decreased knowledge of use of DME;Impaired flexibility;Decreased coordination;Decreased mobility;Improper body mechanics;Decreased endurance;Decreased activity tolerance;Decreased range of motion;Decreased strength;Other (comment);Impaired UE functional use;Decreased balance   Rehab Potential Good   Clinical Impairments Affecting Rehab Potential severity of deficits   OT Frequency 2x / week   OT Duration 8 weeks   OT Treatment/Interventions Self-care/ADL training;Neuromuscular education;Therapeutic exercise;Patient/family education;DME and/or AE instruction;Passive range of motion;Manual Therapy;Therapeutic activities;Cryotherapy;Moist Heat;Energy conservation;Therapist, nutritional;Therapeutic exercises;Ultrasound   Plan transfers to 3 in one, sit to stand, standing with emphasis of longer periods of time with only one UE support, trunk control,    OT Home Exercise Plan Education Issued:  Voc Rehab/Independent Living Info; 11/06/14 Blue putty HEP (grip and pinch), w/c push-up     Consulted and Agree with Plan of Care Patient;Family member/caregiver   Family Member Consulted sisterIvin Booty        Problem List Patient Active Problem List   Diagnosis Date Noted  . 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 Dysuria 09/04/2014  . Hypertension 05/01/2014  . Ingrown right big toenail 05/01/2014  . Hypokalemia 05/01/2014  . GERD (gastroesophageal reflux disease) 02/14/2014  . Dysphagia, pharyngoesophageal phase 02/06/2014  . Type 2  diabetes mellitus without complication ([place]) 89/16/9450  . Constipation   . Neurogenic bowel   . 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 ([place]) 01/20/2014  . Metastatic cancer to spine ([place]) 01/19/2014  . Rotator cuff tendonitis   . Acute postoperative respiratory failure ([place]) 01/09/2014  . 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 Leg weakness, bilateral 12/13/2013    Quay Burow, OTR/L 12/25/2014, 1:15 PM  [facility] [address] [place], [place], [address] Phone: [phone]   Fax:  [phone]

## 2014-12-26 ENCOUNTER — Encounter
Payer: BLUE CROSS/BLUE SHIELD | Attending: Physical Medicine & Rehabilitation | Admitting: Physical Medicine & Rehabilitation

## 2014-12-26 ENCOUNTER — Encounter: Payer: Self-pay | Admitting: Physical Medicine & Rehabilitation

## 2014-12-26 VITALS — BP 104/71 | HR 71

## 2014-12-26 DIAGNOSIS — E669 Obesity, unspecified: Secondary | ICD-10-CM | POA: Diagnosis not present

## 2014-12-26 DIAGNOSIS — Z85850 Personal history of malignant neoplasm of thyroid: Secondary | ICD-10-CM | POA: Diagnosis not present

## 2014-12-26 DIAGNOSIS — E119 Type 2 diabetes mellitus without complications: Secondary | ICD-10-CM

## 2014-12-26 DIAGNOSIS — G8220 Paraplegia, unspecified: Secondary | ICD-10-CM | POA: Insufficient documentation

## 2014-12-26 DIAGNOSIS — D4989 Neoplasm of unspecified behavior of other specified sites: Secondary | ICD-10-CM | POA: Insufficient documentation

## 2014-12-26 DIAGNOSIS — N319 Neuromuscular dysfunction of bladder, unspecified: Secondary | ICD-10-CM | POA: Diagnosis not present

## 2014-12-26 DIAGNOSIS — Z794 Long term (current) use of insulin: Secondary | ICD-10-CM | POA: Diagnosis not present

## 2014-12-26 DIAGNOSIS — M5030 Other cervical disc degeneration, unspecified cervical region: Secondary | ICD-10-CM | POA: Insufficient documentation

## 2014-12-26 DIAGNOSIS — M779 Enthesopathy, unspecified: Secondary | ICD-10-CM | POA: Insufficient documentation

## 2014-12-26 DIAGNOSIS — G839 Paralytic syndrome, unspecified: Secondary | ICD-10-CM | POA: Diagnosis not present

## 2014-12-26 DIAGNOSIS — I10 Essential (primary) hypertension: Secondary | ICD-10-CM | POA: Insufficient documentation

## 2014-12-26 DIAGNOSIS — M1990 Unspecified osteoarthritis, unspecified site: Secondary | ICD-10-CM | POA: Diagnosis not present

## 2014-12-26 DIAGNOSIS — IMO0002 Reserved for concepts with insufficient information to code with codable children: Secondary | ICD-10-CM

## 2014-12-26 NOTE — Progress Notes (Signed)
Subjective:    Patient ID: Sarah [name], female    DOB: August 26, 1965, 49 y.o.   MRN: [id number]  HPI   Sarah [name] is here today primarily for a w/c evaluation and in follow up of her thoracic tumor and paraplegia. She continues at outpt therapies. Sarah [name] has begun standing with her therapist. She showed me a video today!!'  Pain is under good control for the most part. She was sore from therapy last night. She feels stiff at night and when she wakes up in the morning. They cut the baclofen to '20mg'$  TID because it was making her too loose.   She has had increasing strength of her lower exts and trunk. She is interested now in pursuing a manual w/c for use at home and in the community.     Pain Inventory Average Pain 1 Pain Right Now 1 My pain is burning and aching  In the last 24 hours, has pain interfered with the following? General activity 0 Relation with others 0 Enjoyment of life 0 What TIME of day is your pain at its worst? evening Sleep (in general) NA  Pain is worse with: sitting and inactivity Pain improves with: no pain Relief from Meds: no pain med  Mobility use a wheelchair needs help with transfers  Function disabled: date disabled .  Neuro/Psych numbness spasms  Prior Studies Any changes since last visit?  no  Physicians involved in your care Any changes since last visit?  no   Family History  Problem Relation Age of Onset  . Hypertension Mother   . Diabetes Mother   . Hypertension Father   . Stroke Father    Social History   Social History  . Marital Status: Single    Spouse Name: N/A  . Number of Children: N/A  . Years of Education: N/A   Social History Main Topics  . Smoking status: Never Smoker   . Smokeless tobacco: Never Used  . Alcohol Use: No  . Drug Use: No  . Sexual Activity: Not Currently   Other Topics Concern  . None   Social History Narrative   Past Surgical History  Procedure Laterality Date  . Tonsillectomy    .  Laminectomy N/A 12/14/2013    Procedure: THORACIC LAMINECTOMY FOR TUMOR THORACIC THREE;  Surgeon: Ashok Pall, MD;  Location: [place] [facility];  Service: Neurosurgery;  Laterality: N/A;  THORACIC LAMINECTOMY FOR TUMOR THORACIC THREE  . Posterior lumbar fusion 4 level N/A 12/30/2013    Procedure: Thoracic one-Thoracic five posterior thoracic fusion with pedicle screws;  Surgeon: Ashok Pall, MD;  Location: [place] [facility];  Service: Neurosurgery;  Laterality: N/A;  Thoracic one-Thoracic five posterior thoracic fusion with pedicle screws  . Thyroidectomy N/A 01/09/2014    Procedure: TOTAL THYROIDECTOMY;  Surgeon: Izora Gala, MD;  Location: [facility];  Service: ENT;  Laterality: N/A;   Past Medical History  Diagnosis Date  . Hypertension   . DDD (degenerative disc disease), cervical   . DJD (degenerative joint disease)   . Obesity   . Chronic back pain   . 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 History of rectal fissure   . Rotator cuff tendonitis right   BP 104/71 mmHg  Pulse 71  SpO2 100%  LMP 11/23/2014  Opioid Risk Score:   Fall Risk Score:  `1  Depression screen PHQ 2/9  Depression screen [facility] 2/9 12/26/2014 09/19/2014 04/20/2014 03/03/2014  Decreased Interest 0  0 0 0  Down, Depressed, Hopeless 1 1 0 1  PHQ - 2 Score 1 1 0 1  Altered sleeping - 0 - -  Tired, decreased energy - 1 - -  Change in appetite - 0 - -  Feeling bad or failure about yourself  - 0 - -  Trouble concentrating - 0 - -  Moving slowly or fidgety/restless - 0 - -  Suicidal thoughts - 0 - -  PHQ-9 Score - 2 - -      Review of Systems  Musculoskeletal:       Spasms  Neurological: Positive for numbness.  All other systems reviewed and are negative.      Objective:   Physical Exam   Constitutional: She is oriented to person, place, and time. She appears well-developed and well-nourished.  HENT:  Head: Normocephalic and atraumatic.  Neck: Normal range of motion.    Cardiovascular: Normal rate and regular rhythm.  No murmur heard.  Respiratory: Effort normal and breath sounds normal. No respiratory distress. She has no wheezes. She exhibits no tenderness.  GI: Soft. Bowel sounds are normal. She exhibits no distension. There is no tenderness.  Musculoskeletal: She exhibits no edema and no tenderness.  Neurological: She is alert and oriented to person, place, and time. Diminished sensation 1/2 below lower/mid chest wall. Able to sense pain  Motor strength is 5/5 bilateral deltoids, biceps, triceps, grip   2/5 bilateral hip flexors, hip add/hip abd 2+/5, 3- to 3 knee extensors, 3/5 knee flexion, 2-3/5 ankle dorsiflexor and plantar flexors  Tone 1+ /4 left hip adductors, 1/4 gastrocs/hamstrings. T3-4 sensory level  Skin: toe nail stable  Psych: pleasant, in good spirits    Assessment/Plan:  1. 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  -patient is making continued progress. -I believe she is appropriate for a customized manual w/c which she can propel with her upper extremities as well as with her feet (for shorter dx at home). She is unable to use a cane, crutches, or walker given that her strength and sensory function don't allow her enough stability. She is appropriate for a customized manual w/c as she has adequate strength to propel. She is motivated and competent to use such a device moving forward.   2. Pain Management/spasticity:  -tylenol prn  -maintain baclofen at '20mg'$   TID TODAY.  Will titrate up or down further as necessary. The higher dose baclofen affected her voluntary muscle control and adversely affeted her trunk and leg strength. Currently her tone aids her standing and is not severe enough to warrant further intervention. Discussed aggressive stretching before bed and each AM.  4. Neurogenic bladder: timed voids q4-5 x per day to avoid excessive bladder volume and  pressure  5. Neurogenic bowel/GI: no change -suppository if needed   6. Continue with neuro-rehab: continue as she's making functional gains.   8. Dysphagia: ?radiation esophagitis:     Follow up with me in 3 months. Thirty minutes of face to face patient care time were spent during this visit. All questions were encouraged and answered.

## 2014-12-26 NOTE — Patient Instructions (Addendum)
PLEASE CALL ME WITH ANY PROBLEMS OR QUESTIONS (#[phone]).  HAVE A GOOD DAY!   CONTINUE TO WORK ON YOUR RANGE OF MOTION AND STRETCHING----ESPECIALLY AT NIGHT IN THE AM.

## 2014-12-28 ENCOUNTER — Encounter: Payer: Self-pay | Admitting: Occupational Therapy

## 2014-12-28 ENCOUNTER — Ambulatory Visit: Payer: BLUE CROSS/BLUE SHIELD | Admitting: Occupational Therapy

## 2014-12-28 DIAGNOSIS — Z7409 Other reduced mobility: Secondary | ICD-10-CM | POA: Diagnosis not present

## 2014-12-28 DIAGNOSIS — R2991 Unspecified symptoms and signs involving the musculoskeletal system: Secondary | ICD-10-CM

## 2014-12-28 DIAGNOSIS — M6281 Muscle weakness (generalized): Secondary | ICD-10-CM

## 2014-12-28 DIAGNOSIS — G8220 Paraplegia, unspecified: Secondary | ICD-10-CM

## 2014-12-28 DIAGNOSIS — R29818 Other symptoms and signs involving the nervous system: Secondary | ICD-10-CM

## 2014-12-28 NOTE — Patient Instructions (Signed)
Arm exercises for home - do 1 to 2 times per day every day!! Stop if you get shoulder pain and let your therapist know!!  1.  Sit at the edge of your bed or scoot forward in your chair.  Hold 2 pound weight in each hand.  With elbows straight , hold cans upright and start with your hands on your knees. Keeping your elbows straight raise your arms as far as you can over your head then return to starting position. Do 10, rest then do 10 more.  2. Sit in at the edge of your bed or scoot forward in your chair. Hold 2 pound weights in each hand.  Do 15 bicep curls, rest then do 15 more.   3.  Sit at edge of bed or scoot forward in your chair. Hold 2 pound weight in each hand. Start with arms at your side. Keeping elbows straight and palms facing down, raise one arm then the other out to the side. Do 10 on each arm (alternating and doing one arm at time), rest then do 10 more.   4. Sit at the edge of your bed or scoot forward in your chair.  WITHOUT ANY WEIGHT, start with your hands at your chest. Push arms straight out in front of you and hold for a count of 5, then return to starting position. Do 10, rest then do 10 more.   5. In your wheelchair, try and boost your bottom up using your arms and your feet. See how long you can hold. Do 5.

## 2014-12-28 NOTE — Therapy (Signed)
[facility] [address] [place], [place], [address] Phone: [phone]   Fax:  [phone]  Occupational Therapy Treatment  Patient Details  Name: Sarah [name] MRN: [id number] Date of Birth: 1965/11/27 Referring Provider:  Elberta Leatherwood, MD  Encounter Date: 12/28/2014      OT End of Session - 12/28/14 1745    Visit Number 18   Number of Visits 30   Date for OT Re-Evaluation 02/06/15   Authorization Type BCBS primary (64 OT visits) / medicaid secondary (6 OT medicaid visits used, 3 previous BCBS visits used)   Authorization - Visit Number 18   Authorization - Number of Visits 30   OT Start Time 1531   OT Stop Time 1617   OT Time Calculation (min) 46 min   Activity Tolerance Patient tolerated treatment well      Past Medical History  Diagnosis Date  . Hypertension   . DDD (degenerative disc disease), cervical   . DJD (degenerative joint disease)   . Obesity   . Chronic back pain   . 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 History of rectal fissure   . Rotator cuff tendonitis right    Past Surgical History  Procedure Laterality Date  . Tonsillectomy    . Laminectomy N/A 12/14/2013    Procedure: THORACIC LAMINECTOMY FOR TUMOR THORACIC THREE;  Surgeon: Ashok Pall, MD;  Location: [place] NEUR[facility]S;  Service: Neurosurgery;  Laterality: N/A;  THORACIC LAMINECTOMY FOR TUMOR THORACIC THREE  . Posterior lumbar fusion 4 level N/A 12/30/2013    Procedure: Thoracic one-Thoracic five posterior thoracic fusion with pedicle screws;  Surgeon: Ashok Pall, MD;  Location: [place] NEUR[facility] ORS;  Service: Neurosurgery;  Laterality: N/A;  Thoracic one-Thoracic five posterior thoracic fusion with pedicle screws  . Thyroidectomy N/A 01/09/2014    Procedure: TOTAL THYROIDECTOMY;  Surgeon: Izora Gala, MD;  Location: [facility];  Service: ENT;  Laterality: N/A;    There were no vitals filed for this  visit.  Visit Diagnosis:  Decreased functional mobility and endurance  Paraplegia (HCC)  Poor trunk control  Muscle weakness (generalized)      Subjective Assessment - 12/28/14 1535    Subjective  Do you really think I can do this transfer without any help?   Pertinent History s/p laminectomy 12/14/13, s/p lumbar fusion 12/30/13 by Dr. Cyndy Freeze (secondary to tumor)   Patient Stated Goals improve ADLs (bathing, toileting)   Currently in Pain? No/denies                      OT Treatments/Exercises (OP) - 12/28/14 0001    ADLs   Functional Mobility For first time today pt was able to transfer on and off 3 in 1 bariatric drop arm commode with supervision only. Pt required min vc's and encouragement due to fear.  Pt also able to simulate ability to use lateral lean and toilet aid for hygiene.     Shoulder Exercises: Seated   Other Seated Exercises Pt given upgraded HEP to address UE strengthening, core strengthening and balance. See pt instruction for details.                 OT Education - 12/28/14 1741    Education provided Yes   Education Details upgraded HEP for UE   Person(s) Educated Patient   Methods Explanation;Demonstration;Verbal cues;Handout   Comprehension Verbalized understanding;Returned demonstration  OT Short Term Goals - 12/28/14 1741    OT SHORT TERM GOAL #1   Title Independent with updated HEP for UE strengthening--check STGs 11/12/14   Baseline dependent   Time 4   Period Weeks   Status Achieved  pt reports she is doing theraband program with sister at home   OT [place] #2   Title Pt will peform UB bathing on edge of bed with min A.   Baseline mod A lying in bed   Status Achieved   OT SHORT TERM GOAL #3   Title Pt to perform transfer to [facility] using sliding board with mod A.   Baseline not performing, using bed pan   Time 4   Status Achieved  11/06/14:  min A in clinic, but not performing at home (pt would benefit  from bariatic [facility])   OT SHORT TERM GOAL #4   Title Pt will perform LB dressing with mod A using AE prn.   Baseline mod-max A for LB dressing.   Time 4   Status Achieved   OT SHORT TERM GOAL #5   Title Pt will verbalize understanding of appropriate community resources prn.   Baseline dependent   Time 4   Period Weeks   Status Achieved   OT SHORT TERM GOAL #6   Title Pt will be mod I with upgraded HEP - 01/09/2015   Status On-going   OT SHORT TERM GOAL #7   Title Pt will be supervision with 3 in 1 commode transfers to larger padded commode   Status Achieved   OT SHORT TERM GOAL #8   Title Pt will be mod a for toilet hygiene using larger 3 in 1 commode   Status Achieved           OT Long Term Goals - 12/28/14 1742    OT LONG TERM GOAL #1   Title Pt will perform UB/LB bathing mod I once transfer to tub bench is completed using AE/DME prn. - 12/08/2014   Baseline dependent LB, bathing in bed   Time 8   Period Weeks   Status Deferred  pt is unable to access her bathroom at home   OT LONG TERM GOAL #2   Title Pt will peform sliding board transfer to tub bench with min A.   Baseline sponge bathing in bed, not performin   Time 8   Period Weeks   Status Achieved   OT LONG TERM GOAL #3   Title Pt to perform transfer to [facility] using sliding board with min A.  completed without sliding board   Baseline not peforming, using bed pan   Time 8   Period Weeks   Status Achieved   OT LONG TERM GOAL #4   Title Pt will perform toileting hygiene independently after bowel movement using BSC.- 02/06/2015   Baseline dependent   Time 8   Period Weeks   Status On-going   OT LONG TERM GOAL #5   Title Pt will stand with min a to hike pants for dressing using grab bar   Status On-going   OT LONG TERM GOAL #6   Title Pt will demonstrate ability to stand  with min a using only unilateral UE support in order to free one UE for simple functional tasks.   Status On-going   OT LONG TERM GOAL #7    Title Pt will be mod I with upgraded HEP    Status On-going  Plan - 12/28/14 1742    Clinical Impression Statement Pt continues to progress toward goals and is very motivated to be as independent as possible. Pt improving with functional mobility, overall activity tolerance and independence.    Pt will benefit from skilled therapeutic intervention in order to improve on the following deficits (Retired) Decreased knowledge of use of DME;Impaired flexibility;Decreased coordination;Decreased mobility;Improper body mechanics;Decreased endurance;Decreased activity tolerance;Decreased range of motion;Decreased strength;Other (comment);Impaired UE functional use;Decreased balance   Rehab Potential Good   Clinical Impairments Affecting Rehab Potential severity of deficits   OT Frequency 2x / week   OT Duration 8 weeks   OT Treatment/Interventions Self-care/ADL training;Neuromuscular education;Therapeutic exercise;Patient/family education;DME and/or AE instruction;Passive range of motion;Manual Therapy;Therapeutic activities;Cryotherapy;Moist Heat;Energy conservation;Therapist, nutritional;Therapeutic exercises;Ultrasound   Plan functional mobility, UE strength, balance, core strength, standing with longer periods of time of only 1 UE support   OT Home Exercise Plan Education Issued:  Voc Rehab/Independent Living Info; 11/06/14 Blue putty HEP (grip and pinch), w/c push-up     Consulted and Agree with Plan of Care Patient        Problem List Patient Active Problem List   Diagnosis Date Noted  . 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 Dysuria 09/04/2014  . Hypertension 05/01/2014  . Ingrown right big toenail 05/01/2014  . Hypokalemia 05/01/2014  . GERD (gastroesophageal reflux disease) 02/14/2014  . Dysphagia, pharyngoesophageal phase 02/06/2014  . Type 2 diabetes mellitus without complication ([place]) 14/43/1540  . Constipation   . Neurogenic bowel   . Paraplegia at T4  level ([place]) 01/20/2014  . Metastatic cancer to spine ([place]) 01/19/2014  . Rotator cuff tendonitis   . Acute postoperative respiratory failure ([place]) 01/09/2014  . 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 Leg weakness, bilateral 12/13/2013    Quay Burow, OTR/L 12/28/2014, 5:46 PM  [facility] [address] [place], [place], [address] Phone: [phone]   Fax:  [phone]

## 2015-01-02 ENCOUNTER — Ambulatory Visit: Payer: BLUE CROSS/BLUE SHIELD | Admitting: Occupational Therapy

## 2015-01-02 ENCOUNTER — Encounter: Payer: Self-pay | Admitting: Occupational Therapy

## 2015-01-02 DIAGNOSIS — R29818 Other symptoms and signs involving the nervous system: Secondary | ICD-10-CM

## 2015-01-02 DIAGNOSIS — R2991 Unspecified symptoms and signs involving the musculoskeletal system: Secondary | ICD-10-CM

## 2015-01-02 DIAGNOSIS — Z7409 Other reduced mobility: Secondary | ICD-10-CM | POA: Diagnosis not present

## 2015-01-02 DIAGNOSIS — M6281 Muscle weakness (generalized): Secondary | ICD-10-CM

## 2015-01-02 DIAGNOSIS — G8220 Paraplegia, unspecified: Secondary | ICD-10-CM

## 2015-01-02 NOTE — Patient Instructions (Signed)
Stretch in wheelchair using noodle length wise up center of back and reclining wheelchair. Reaching activities in mid plane as well as across body (obliques) in wheelchair with back reclined.

## 2015-01-02 NOTE — Therapy (Signed)
[facility] [address] [place], [place], [address] Phone: [phone]   Fax:  [phone]  Occupational Therapy Treatment  Patient Details  Name: Sarah [name] MRN: [id number] Date of Birth: [birth year] Referring Provider:  Elberta Leatherwood, MD  Encounter Date: 01/02/2015      OT End of Session - 01/02/15 1718    Visit Number 19   Number of Visits 30   Date for OT Re-Evaluation 02/06/15   Authorization Type BCBS primary (82 OT visits) / medicaid secondary (6 OT medicaid visits used, 3 previous BCBS visits used)   Authorization - Visit Number 75   Authorization - Number of Visits 30   OT Start Time 1617   OT Stop Time 1703   OT Time Calculation (min) 46 min   Activity Tolerance Patient tolerated treatment well      Past Medical History  Diagnosis Date  . Hypertension   . DDD (degenerative disc disease), cervical   . DJD (degenerative joint disease)   . Obesity   . Chronic back pain   . 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 History of rectal fissure   . Rotator cuff tendonitis right    Past Surgical History  Procedure Laterality Date  . Tonsillectomy    . Laminectomy N/A 12/14/2013    Procedure: THORACIC LAMINECTOMY FOR TUMOR THORACIC THREE;  Surgeon: Ashok Pall, MD;  Location: [place] NEUR[facility] ORS;  Service: Neurosurgery;  Laterality: N/A;  THORACIC LAMINECTOMY FOR TUMOR THORACIC THREE  . Posterior lumbar fusion 4 level N/A 12/30/2013    Procedure: Thoracic one-Thoracic five posterior thoracic fusion with pedicle screws;  Surgeon: Ashok Pall, MD;  Location: [place] NEURO ORS;  Service: Neurosurgery;  Laterality: N/A;  Thoracic one-Thoracic five posterior thoracic fusion with pedicle screws  . Thyroidectomy N/A 01/09/2014    Procedure: TOTAL THYROIDECTOMY;  Surgeon: Izora Gala, MD;  Location: [facility];  Service: ENT;  Laterality: N/A;    There were no vitals filed for this  visit.  Visit Diagnosis:  Decreased functional mobility and endurance  Paraplegia (HCC)  Poor trunk control  Muscle weakness (generalized)      Subjective Assessment - 01/02/15 1622    Subjective  My back is really hurting today   Pertinent History s/p laminectomy 12/14/13, s/p lumbar fusion 12/30/13 by Dr. Cyndy Freeze (secondary to tumor)   Patient Stated Goals improve ADLs (bathing, toileting)   Currently in Pain? Yes   Pain Score 2   increased significantly with attempt to transfer   Pain Location Back   Pain Orientation Lower;Mid   Pain Descriptors / Indicators Sharp   Pain Type Chronic pain   Pain Onset More than a month ago   Pain Frequency Intermittent   Aggravating Factors  moving a certain way it will grab   Pain Relieving Factors avoid certain movements                      OT Treatments/Exercises (OP) - 01/02/15 0001    Neurological Re-education Exercises   Other Exercises 1 Pt with significant back pain today especially with any use of back extensors. Pt required  mod a x2 to transfer chair to mat today due to pain.  Neuro re ed to increase length and flexibility of  lower back followed by activation of abdominal muscles in straight plane and then obliques. Pt's ability to use core flexor muscles improved with repetition and practice.  Pt was then able to transfer back to wheelchair with very minimal pain. and no assistance. Pt given stretch to do in wheelchair as well as abdominal activities to do in wheelchair with chair reclined. Pt verbalized understandig and able to return demonstrate.                 OT Education - 01/02/15 1715    Education provided Yes   Education Details back stretch, abdominal activities   Person(s) Educated Patient   Methods Explanation;Demonstration;Verbal cues;Handout   Comprehension Verbalized understanding;Returned demonstration          OT Short Term Goals - 01/02/15 1715    OT SHORT TERM GOAL #1   Title  Independent with updated HEP for UE strengthening--check STGs 11/12/14   Baseline dependent   Time 4   Period Weeks   Status Achieved  pt reports she is doing theraband program with sister at home   OT [place] #2   Title Pt will peform UB bathing on edge of bed with min A.   Baseline mod A lying in bed   Status Achieved   OT SHORT TERM GOAL #3   Title Pt to perform transfer to [facility] using sliding board with mod A.   Baseline not performing, using bed pan   Time 4   Status Achieved  11/06/14:  min A in clinic, but not performing at home (pt would benefit from bariatic [facility])   OT SHORT TERM GOAL #4   Title Pt will perform LB dressing with mod A using AE prn.   Baseline mod-max A for LB dressing.   Time 4   Status Achieved   OT SHORT TERM GOAL #5   Title Pt will verbalize understanding of appropriate community resources prn.   Baseline dependent   Time 4   Period Weeks   Status Achieved   OT SHORT TERM GOAL #6   Title Pt will be mod I with upgraded HEP - 01/09/2015   Status On-going   OT SHORT TERM GOAL #7   Title Pt will be supervision with 3 in 1 commode transfers to larger padded commode   Status Achieved   OT SHORT TERM GOAL #8   Title Pt will be mod a for toilet hygiene using larger 3 in 1 commode   Status Achieved           OT Long Term Goals - 01/02/15 1716    OT LONG TERM GOAL #1   Title Pt will perform UB/LB bathing mod I once transfer to tub bench is completed using AE/DME prn. - 12/08/2014   Baseline dependent LB, bathing in bed   Time 8   Period Weeks   Status Deferred  pt is unable to access her bathroom at home   OT LONG TERM GOAL #2   Title Pt will peform sliding board transfer to tub bench with min A.   Baseline sponge bathing in bed, not performin   Time 8   Period Weeks   Status Achieved   OT LONG TERM GOAL #3   Title Pt to perform transfer to [facility] using sliding board with min A.  completed without sliding board   Baseline not peforming,  using bed pan   Time 8   Period Weeks   Status Achieved   OT LONG TERM GOAL #4   Title Pt will perform toileting hygiene independently after bowel movement using BSC.- 02/06/2015   Baseline dependent   Time 8  Period Weeks   Status On-going   OT LONG TERM GOAL #5   Title Pt will stand with min a to hike pants for dressing using grab bar   Status On-going   OT LONG TERM GOAL #6   Title Pt will demonstrate ability to stand  with min a using only unilateral UE support in order to free one UE for simple functional tasks.   Status On-going   OT LONG TERM GOAL #7   Title Pt will be mod I with upgraded HEP    Status On-going               Plan - 01/02/15 1716    Clinical Impression Statement Pt with signficant back pain today that interfered with functional mobility.  After treatment session, pt with far less pain and able to transfer without assistance. Pt given activities to do at home to maintain.   Pt will benefit from skilled therapeutic intervention in order to improve on the following deficits (Retired) Decreased knowledge of use of DME;Impaired flexibility;Decreased coordination;Decreased mobility;Improper body mechanics;Decreased endurance;Decreased activity tolerance;Decreased range of motion;Decreased strength;Other (comment);Impaired UE functional use;Decreased balance   Rehab Potential Good   Clinical Impairments Affecting Rehab Potential severity of deficits   OT Frequency 2x / week   OT Duration 8 weeks   OT Treatment/Interventions Self-care/ADL training;Neuromuscular education;Therapeutic exercise;Patient/family education;DME and/or AE instruction;Passive range of motion;Manual Therapy;Therapeutic activities;Cryotherapy;Moist Heat;Energy conservation;Therapist, nutritional;Therapeutic exercises;Ultrasound   Plan address back pain prn, functional moblity, balance, sit to stand, standing balance, core strength   OT Home Exercise Plan Education Issued:  Voc  Rehab/Independent Living Info; 11/06/14 Blue putty HEP (grip and pinch), w/c push-up     Family Member Consulted sisterIvin Booty        Problem List Patient Active Problem List   Diagnosis Date Noted  . 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 Dysuria 09/04/2014  . Hypertension 05/01/2014  . Ingrown right big toenail 05/01/2014  . Hypokalemia 05/01/2014  . GERD (gastroesophageal reflux disease) 02/14/2014  . Dysphagia, pharyngoesophageal phase 02/06/2014  . Type 2 diabetes mellitus without complication ([place]) 30/09/6224  . Constipation   . Neurogenic bowel   . 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 ([place]) 01/20/2014  . Metastatic cancer to spine ([place]) 01/19/2014  . Rotator cuff tendonitis   . Acute postoperative respiratory failure ([place]) 01/09/2014  . 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 Leg weakness, bilateral 12/13/2013    Quay Burow, OTR/L 01/02/2015, 5:25 PM  [facility] [address] [place], [place], [address] Phone: [phone]   Fax:  [phone]

## 2015-01-04 ENCOUNTER — Ambulatory Visit: Payer: BLUE CROSS/BLUE SHIELD | Admitting: Occupational Therapy

## 2015-01-09 ENCOUNTER — Encounter: Payer: Self-pay | Admitting: Occupational Therapy

## 2015-01-09 ENCOUNTER — Ambulatory Visit: Payer: BLUE CROSS/BLUE SHIELD | Admitting: Occupational Therapy

## 2015-01-09 DIAGNOSIS — G8220 Paraplegia, unspecified: Secondary | ICD-10-CM

## 2015-01-09 DIAGNOSIS — R2991 Unspecified symptoms and signs involving the musculoskeletal system: Secondary | ICD-10-CM

## 2015-01-09 DIAGNOSIS — Z7409 Other reduced mobility: Secondary | ICD-10-CM | POA: Diagnosis not present

## 2015-01-09 DIAGNOSIS — M6281 Muscle weakness (generalized): Secondary | ICD-10-CM

## 2015-01-09 DIAGNOSIS — R29818 Other symptoms and signs involving the nervous system: Secondary | ICD-10-CM

## 2015-01-09 DIAGNOSIS — M25619 Stiffness of unspecified shoulder, not elsewhere classified: Secondary | ICD-10-CM

## 2015-01-09 NOTE — Therapy (Signed)
[facility] [address] [place], [place], [address] Phone: [phone]   Fax:  [phone]  Occupational Therapy Treatment  Patient Details  Name: Sarah [name] MRN: [id number] Date of Birth: 1965-08-19 No Data Recorded  Encounter Date: 01/09/2015      OT End of Session - 01/09/15 1649    Visit Number 20   Number of Visits 30   Date for OT Re-Evaluation 02/06/15   Authorization Type BCBS primary (39 OT visits) / medicaid secondary (6 OT medicaid visits used, 3 previous BCBS visits used)   Authorization - Visit Number 20   Authorization - Number of Visits 30   OT Start Time 1402   OT Stop Time 1445   OT Time Calculation (min) 43 min   Activity Tolerance Patient tolerated treatment well      Past Medical History  Diagnosis Date  . Hypertension   . DDD (degenerative disc disease), cervical   . DJD (degenerative joint disease)   . Obesity   . Chronic back pain   . 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 History of rectal fissure   . Rotator cuff tendonitis right    Past Surgical History  Procedure Laterality Date  . Tonsillectomy    . Laminectomy N/A 12/14/2013    Procedure: THORACIC LAMINECTOMY FOR TUMOR THORACIC THREE;  Surgeon: Ashok Pall, MD;  Location: [place] [facility];  Service: Neurosurgery;  Laterality: N/A;  THORACIC LAMINECTOMY FOR TUMOR THORACIC THREE  . Posterior lumbar fusion 4 level N/A 12/30/2013    Procedure: Thoracic one-Thoracic five posterior thoracic fusion with pedicle screws;  Surgeon: Ashok Pall, MD;  Location: [place] [facility];  Service: Neurosurgery;  Laterality: N/A;  Thoracic one-Thoracic five posterior thoracic fusion with pedicle screws  . Thyroidectomy N/A 01/09/2014    Procedure: TOTAL THYROIDECTOMY;  Surgeon: Izora Gala, MD;  Location: [place];  Service: ENT;  Laterality: N/A;    There were no vitals filed for this visit.  Visit  Diagnosis:  Decreased functional mobility and endurance  Paraplegia (HCC)  Poor trunk control  Muscle weakness (generalized)  Stiffness of joint, shoulder region, unspecified laterality      Subjective Assessment - 01/09/15 1642    Subjective  My back has been a little better but stills feel tight   Pertinent History s/p laminectomy 12/14/13, s/p lumbar fusion 12/30/13 by Dr. Cyndy Freeze (secondary to tumor)   Patient Stated Goals improve ADLs (bathing, toileting)   Currently in Pain? Yes   Pain Score 3    Pain Location Back   Pain Orientation Lower;Mid   Pain Descriptors / Indicators Sharp   Pain Type Chronic pain   Pain Onset More than a month ago   Pain Frequency Intermittent   Aggravating Factors  moving a certain way   Pain Relieving Factors avoid certain movements                      OT Treatments/Exercises (OP) - 01/09/15 0001    Exercises   Exercises Shoulder   Shoulder Exercises: Seated   Other Seated Exercises Adjusted HEP for UE strengthening. Pt was completing sitting EOB however concerned that this may be aggravating back. Pt positioned reclined in wheelchair with bolster long wise behind back to encourage extension and stretch and then completed all UE's in semi reclined position with increased weight . Pt able to tolerated 2-3 pounds of weight with 15 reps x2  for all three exercises then sat up and worked on push ups in wheelchair. Pt stated back felt much better at end of session.                  OT Short Term Goals - 01/09/15 1646    OT SHORT TERM GOAL #1   Title Independent with updated HEP for UE strengthening--check STGs 11/12/14   Baseline dependent   Time 4   Period Weeks   Status Achieved  pt reports she is doing theraband program with sister at home   OT [place] #2   Title Pt will peform UB bathing on edge of bed with min A.   Baseline mod A lying in bed   Status Achieved   OT SHORT TERM GOAL #3   Title Pt to perform  transfer to [facility] using sliding board with mod A.   Baseline not performing, using bed pan   Time 4   Status Achieved  11/06/14:  min A in clinic, but not performing at home (pt would benefit from bariatic [facility])   OT SHORT TERM GOAL #4   Title Pt will perform LB dressing with mod A using AE prn.   Baseline mod-max A for LB dressing.   Time 4   Status Achieved   OT SHORT TERM GOAL #5   Title Pt will verbalize understanding of appropriate community resources prn.   Baseline dependent   Time 4   Period Weeks   Status Achieved   OT SHORT TERM GOAL #6   Title Pt will be mod I with upgraded HEP - 01/09/2015   Status Achieved   OT SHORT TERM GOAL #7   Title Pt will be supervision with 3 in 1 commode transfers to larger padded commode   Status Achieved   OT SHORT TERM GOAL #8   Title Pt will be mod a for toilet hygiene using larger 3 in 1 commode   Status Achieved           OT Long Term Goals - 01/09/15 1647    OT LONG TERM GOAL #1   Title Pt will perform UB/LB bathing mod I once transfer to tub bench is completed using AE/DME prn. - 12/08/2014   Baseline dependent LB, bathing in bed   Time 8   Period Weeks   Status Deferred  pt is unable to access her bathroom at home   OT LONG TERM GOAL #2   Title Pt will peform sliding board transfer to tub bench with min A.   Baseline sponge bathing in bed, not performin   Time 8   Period Weeks   Status Achieved   OT LONG TERM GOAL #3   Title Pt to perform transfer to [facility] using sliding board with min A.  completed without sliding board   Baseline not peforming, using bed pan   Time 8   Period Weeks   Status Achieved   OT LONG TERM GOAL #4   Title Pt will perform toileting hygiene independently after bowel movement using BSC.- 02/06/2015   Baseline dependent   Time 8   Period Weeks   Status On-going   OT LONG TERM GOAL #5   Title Pt will stand with min a to hike pants for dressing using grab bar   Status On-going   OT LONG TERM GOAL  #6   Title Pt will demonstrate ability to stand  with min a using only unilateral UE support in order  to free one UE for simple functional tasks.   Status On-going   OT LONG TERM GOAL #7   Title Pt will be mod I with upgraded HEP    Status On-going               Plan - 01/09/15 1647    Clinical Impression Statement Pt has had set back due to lower back pain and missed two therapy appointments. Pt feeling a bit better today and adjusted HEP to allow for UE strengthening while protecting her back.    Pt will benefit from skilled therapeutic intervention in order to improve on the following deficits (Retired) Decreased knowledge of use of DME;Impaired flexibility;Decreased coordination;Decreased mobility;Improper body mechanics;Decreased endurance;Decreased activity tolerance;Decreased range of motion;Decreased strength;Other (comment);Impaired UE functional use;Decreased balance   Rehab Potential Good   Clinical Impairments Affecting Rehab Potential severity of deficits   OT Frequency 2x / week   OT Duration 8 weeks   OT Treatment/Interventions Self-care/ADL training;Neuromuscular education;Therapeutic exercise;Patient/family education;DME and/or AE instruction;Passive range of motion;Manual Therapy;Therapeutic activities;Cryotherapy;Moist Heat;Energy conservation;Therapist, nutritional;Therapeutic exercises;Ultrasound   Plan addres back pain prn, functional mobility, balance, sit to stand, core strength, decrease dependency on UE   OT Home Exercise Plan Education Issued:  Voc Rehab/Independent Living Info; 11/06/14 Blue putty HEP (grip and pinch), w/c push-up     Consulted and Agree with Plan of Care Patient   Family Member Consulted sisterIvin Booty        Problem List Patient Active Problem List   Diagnosis Date Noted  . 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 Dysuria 09/04/2014  . Hypertension 05/01/2014  . Ingrown right big toenail 05/01/2014  . Hypokalemia 05/01/2014  .  GERD (gastroesophageal reflux disease) 02/14/2014  . Dysphagia, pharyngoesophageal phase 02/06/2014  . Type 2 diabetes mellitus without complication ([place]) 76/14/7092  . Constipation   . Neurogenic bowel   . 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 Metastatic cancer to spine ([place]) 01/19/2014  . Rotator cuff tendonitis   . Acute postoperative respiratory failure ([place]) 01/09/2014  . 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 Leg weakness, bilateral 12/13/2013    Quay Burow, OTR/L 01/09/2015, 4:51 PM  [facility] [address] [place], [place], [address] Phone: [phone]   Fax:  [phone]  Name: SVEA PUSCH MRN: [id number] Date of Birth: January 17, 1966

## 2015-01-16 ENCOUNTER — Other Ambulatory Visit: Payer: Self-pay | Admitting: Family Medicine

## 2015-01-16 ENCOUNTER — Encounter: Payer: Self-pay | Admitting: Occupational Therapy

## 2015-01-16 ENCOUNTER — Ambulatory Visit: Payer: BLUE CROSS/BLUE SHIELD | Admitting: Occupational Therapy

## 2015-01-16 DIAGNOSIS — G8220 Paraplegia, unspecified: Secondary | ICD-10-CM

## 2015-01-16 DIAGNOSIS — M6281 Muscle weakness (generalized): Secondary | ICD-10-CM

## 2015-01-16 DIAGNOSIS — R29818 Other symptoms and signs involving the nervous system: Secondary | ICD-10-CM

## 2015-01-16 DIAGNOSIS — R2991 Unspecified symptoms and signs involving the musculoskeletal system: Secondary | ICD-10-CM

## 2015-01-16 DIAGNOSIS — M25619 Stiffness of unspecified shoulder, not elsewhere classified: Secondary | ICD-10-CM

## 2015-01-16 DIAGNOSIS — Z7409 Other reduced mobility: Secondary | ICD-10-CM | POA: Diagnosis not present

## 2015-01-16 NOTE — Therapy (Signed)
[facility] [address] [place], [place], [address] Phone: [phone]   Fax:  [phone]  Occupational Therapy Treatment  Patient Details  Name: Sarah [name] MRN: [id number] Date of Birth: 10/23/1965 No Data Recorded  Encounter Date: 01/16/2015      OT End of Session - 01/16/15 1708    Visit Number 21   Number of Visits 30   Date for OT Re-Evaluation 02/06/15   Authorization Type BCBS primary (21 OT visits) / medicaid secondary (6 OT medicaid visits used, 3 previous BCBS visits used)   Authorization - Visit Number 21   Authorization - Number of Visits 30   OT Start Time 1617   OT Stop Time 1700   OT Time Calculation (min) 43 min   Activity Tolerance Patient tolerated treatment well      Past Medical History  Diagnosis Date  . Hypertension   . DDD (degenerative disc disease), cervical   . DJD (degenerative joint disease)   . Obesity   . Chronic back pain   . 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 History of rectal fissure   . Rotator cuff tendonitis right    Past Surgical History  Procedure Laterality Date  . Tonsillectomy    . Laminectomy N/A 12/14/2013    Procedure: THORACIC LAMINECTOMY FOR TUMOR THORACIC THREE;  Surgeon: Ashok Pall, MD;  Location: [place] NEUR[facility] ORS;  Service: Neurosurgery;  Laterality: N/A;  THORACIC LAMINECTOMY FOR TUMOR THORACIC THREE  . Posterior lumbar fusion 4 level N/A 12/30/2013    Procedure: Thoracic one-Thoracic five posterior thoracic fusion with pedicle screws;  Surgeon: Ashok Pall, MD;  Location: [place] NEUR[facility] ORS;  Service: Neurosurgery;  Laterality: N/A;  Thoracic one-Thoracic five posterior thoracic fusion with pedicle screws  . Thyroidectomy N/A 01/09/2014    Procedure: TOTAL THYROIDECTOMY;  Surgeon: Izora Gala, MD;  Location: [facility];  Service: ENT;  Laterality: N/A;    There were no vitals filed for this visit.  Visit  Diagnosis:  Paraplegia ([place])  Muscle weakness (generalized)  Poor trunk control  Stiffness of joint, shoulder region, unspecified laterality      Subjective Assessment - 01/16/15 1624    Subjective  My back bothered me after I did the arm weights last time so I have been doing the bands instead.    Pertinent History s/p laminectomy 12/14/13, s/p lumbar fusion 12/30/13 by Dr. Cyndy Freeze (secondary to tumor)   Patient Stated Goals improve ADLs (bathing, toileting)   Currently in Pain? No/denies  stiffness but no actual pain                      OT Treatments/Exercises (OP) - 01/16/15 0001    Neurological Re-education Exercises   Other Exercises 1 Neuro re ed to address sit to stand, standing balance with BUE's support progressing to unilateral UE support for brief periods while pt engaged in activity in standing, then stand to sit. Pt required min a x1, stand by of 1 for 2/3 trials using grap bar to stand, mod a x1, stand by of 1 for 1/3 trials. Pt with better core control in standing as well as improved alignment.                   OT Short Term Goals - 01/16/15 1705    OT SHORT TERM GOAL #1   Title Independent with updated HEP for UE strengthening--check STGs 11/12/14  Baseline dependent   Time 4   Period Weeks   Status Achieved  pt reports she is doing theraband program with sister at home   OT [place] #2   Title Pt will peform UB bathing on edge of bed with min A.   Baseline mod A lying in bed   Status Achieved   OT SHORT TERM GOAL #3   Title Pt to perform transfer to [facility] using sliding board with mod A.   Baseline not performing, using bed pan   Time 4   Status Achieved  11/06/14:  min A in clinic, but not performing at home (pt would benefit from bariatic [facility])   OT SHORT TERM GOAL #4   Title Pt will perform LB dressing with mod A using AE prn.   Baseline mod-max A for LB dressing.   Time 4   Status Achieved   OT SHORT TERM GOAL #5   Title  Pt will verbalize understanding of appropriate community resources prn.   Baseline dependent   Time 4   Period Weeks   Status Achieved   OT SHORT TERM GOAL #6   Title Pt will be mod I with upgraded HEP - 01/09/2015   Status Achieved   OT SHORT TERM GOAL #7   Title Pt will be supervision with 3 in 1 commode transfers to larger padded commode   Status Achieved   OT SHORT TERM GOAL #8   Title Pt will be mod a for toilet hygiene using larger 3 in 1 commode   Status Achieved           OT Long Term Goals - 01/16/15 1706    OT LONG TERM GOAL #1   Title Pt will perform UB/LB bathing mod I once transfer to tub bench is completed using AE/DME prn. - 12/08/2014   Baseline dependent LB, bathing in bed   Time 8   Period Weeks   Status Deferred  pt is unable to access her bathroom at home   OT LONG TERM GOAL #2   Title Pt will peform sliding board transfer to tub bench with min A.   Baseline sponge bathing in bed, not performin   Time 8   Period Weeks   Status Achieved   OT LONG TERM GOAL #3   Title Pt to perform transfer to [facility] using sliding board with min A.  completed without sliding board   Baseline not peforming, using bed pan   Time 8   Period Weeks   Status Achieved   OT LONG TERM GOAL #4   Title Pt will perform toileting hygiene independently after bowel movement using BSC.- 02/06/2015   Baseline dependent   Time 8   Period Weeks   Status On-going   OT LONG TERM GOAL #5   Title Pt will stand with min a to hike pants for dressing using grab bar   Status On-going   OT LONG TERM GOAL #6   Title Pt will demonstrate ability to stand  with min a using only unilateral UE support in order to free one UE for simple functional tasks.   Status Achieved   OT LONG TERM GOAL #7   Title Pt will be mod I with upgraded HEP    Status On-going               Plan - 01/16/15 1706    Clinical Impression Statement Pt has had back pain for past few weeks which has limited  standing  activities in therapy - pt with less pain today and improved mobility. Left message at equipment vendor about 3 in 1 commode. Pt also has called.    Pt will benefit from skilled therapeutic intervention in order to improve on the following deficits (Retired) Decreased knowledge of use of DME;Impaired flexibility;Decreased coordination;Decreased mobility;Improper body mechanics;Decreased endurance;Decreased activity tolerance;Decreased range of motion;Decreased strength;Other (comment);Impaired UE functional use;Decreased balance   Rehab Potential Good   Clinical Impairments Affecting Rehab Potential severity of deficits   OT Frequency 2x / week   OT Duration 8 weeks   OT Treatment/Interventions Self-care/ADL training;Neuromuscular education;Therapeutic exercise;Patient/family education;DME and/or AE instruction;Passive range of motion;Manual Therapy;Therapeutic activities;Cryotherapy;Moist Heat;Energy conservation;Therapist, nutritional;Therapeutic exercises;Ultrasound   Plan address back pain prn, functional mobility, sit to stand, standing balance   OT Home Exercise Plan Education Issued:  Voc Rehab/Independent Living Info; 11/06/14 Blue putty HEP (grip and pinch), w/c push-up     Consulted and Agree with Plan of Care Patient   Family Member Consulted sisterIvin Booty        Problem List Patient Active Problem List   Diagnosis Date Noted  . 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 Dysuria 09/04/2014  . Hypertension 05/01/2014  . Ingrown right big toenail 05/01/2014  . Hypokalemia 05/01/2014  . GERD (gastroesophageal reflux disease) 02/14/2014  . Dysphagia, pharyngoesophageal phase 02/06/2014  . Type 2 diabetes mellitus without complication ([place]) 15/83/0940  . Constipation   . Neurogenic bowel   . 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 ([place]) 01/20/2014  . Metastatic cancer to spine ([place]) 01/19/2014  . Rotator cuff tendonitis   . Acute postoperative respiratory failure ([place]) 01/09/2014  . Hurthle  cell neoplasm of thyroid 12/30/2013  . 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 Leg weakness, bilateral 12/13/2013    Quay Burow, OTR/L 01/16/2015, 5:09 PM  [facility] [address] [place], [place], [address] Phone: [phone]   Fax:  [phone]  Name: KOREY ARROYO MRN: [id number] Date of Birth: 11-13-1965

## 2015-01-17 NOTE — Telephone Encounter (Signed)
2nd Request.  Derl Barrow, RN

## 2015-01-18 ENCOUNTER — Ambulatory Visit: Payer: BLUE CROSS/BLUE SHIELD | Admitting: Occupational Therapy

## 2015-01-18 ENCOUNTER — Encounter: Payer: Self-pay | Admitting: Occupational Therapy

## 2015-01-18 DIAGNOSIS — M25619 Stiffness of unspecified shoulder, not elsewhere classified: Secondary | ICD-10-CM

## 2015-01-18 DIAGNOSIS — Z7409 Other reduced mobility: Secondary | ICD-10-CM | POA: Diagnosis not present

## 2015-01-18 DIAGNOSIS — R2991 Unspecified symptoms and signs involving the musculoskeletal system: Secondary | ICD-10-CM

## 2015-01-18 DIAGNOSIS — M6281 Muscle weakness (generalized): Secondary | ICD-10-CM

## 2015-01-18 DIAGNOSIS — R29818 Other symptoms and signs involving the nervous system: Secondary | ICD-10-CM

## 2015-01-18 DIAGNOSIS — G8220 Paraplegia, unspecified: Secondary | ICD-10-CM

## 2015-01-18 NOTE — Therapy (Signed)
[facility] [address] [place], [place], [address] Phone: [phone]   Fax:  [phone]  Occupational Therapy Treatment  Patient Details  Name: Sarah [name] MRN: [id number] Date of Birth: 12/12/1965 No Data Recorded  Encounter Date: 01/18/2015      OT End of Session - 01/18/15 1658    Visit Number 22   Number of Visits 30   Date for OT Re-Evaluation 02/06/15   Authorization Type BCBS primary (57 OT visits) / medicaid secondary (6 OT medicaid visits used, 3 previous BCBS visits used)   Authorization - Visit Number 22   Authorization - Number of Visits 30   OT Start Time 1550  pt arrived late   OT Stop Time 1620   OT Time Calculation (min) 30 min      Past Medical History  Diagnosis Date  . Hypertension   . DDD (degenerative disc disease), cervical   . DJD (degenerative joint disease)   . Obesity   . Chronic back pain   . 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 History of rectal fissure   . Rotator cuff tendonitis right    Past Surgical History  Procedure Laterality Date  . Tonsillectomy    . Laminectomy N/A 12/14/2013    Procedure: THORACIC LAMINECTOMY FOR TUMOR THORACIC THREE;  Surgeon: Ashok Pall, MD;  Location: [place] [facility];  Service: Neurosurgery;  Laterality: N/A;  THORACIC LAMINECTOMY FOR TUMOR THORACIC THREE  . Posterior lumbar fusion 4 level N/A 12/30/2013    Procedure: Thoracic one-Thoracic five posterior thoracic fusion with pedicle screws;  Surgeon: Ashok Pall, MD;  Location: [place] NEUR[facility] ORS;  Service: Neurosurgery;  Laterality: N/A;  Thoracic one-Thoracic five posterior thoracic fusion with pedicle screws  . Thyroidectomy N/A 01/09/2014    Procedure: TOTAL THYROIDECTOMY;  Surgeon: Izora Gala, MD;  Location: [facility];  Service: ENT;  Laterality: N/A;    There were no vitals filed for this visit.  Visit Diagnosis:  Paraplegia ([place])  Muscle  weakness (generalized)  Poor trunk control  Stiffness of joint, shoulder region, unspecified laterality  Decreased functional mobility and endurance      Subjective Assessment - 01/18/15 1551    Subjective  My back is better it isn't sore just stiff now.    Pertinent History s/p laminectomy 12/14/13, s/p lumbar fusion 12/30/13 by Dr. Cyndy Freeze (secondary to tumor)   Patient Stated Goals improve ADLs (bathing, toileting)   Currently in Pain? No/denies                      OT Treatments/Exercises (OP) - 01/18/15 0001    Neurological Re-education Exercises   Other Exercises 1 Neuro re ed to address increased use of LE's during functional transfers, forwrard lean, sit to squat (pt able to lift bottom about 1.5 inches off surface), sitting balance and resistive core strengthening in sitting.                   OT Short Term Goals - 01/18/15 1655    OT SHORT TERM GOAL #1   Title Independent with updated HEP for UE strengthening--check STGs 11/12/14   Baseline dependent   Time 4   Period Weeks   Status Achieved  pt reports she is doing theraband program with sister at home   OT [place] #2   Title Pt will peform UB bathing on edge of bed with min A.  Baseline mod A lying in bed   Status Achieved   OT SHORT TERM GOAL #3   Title Pt to perform transfer to [facility] using sliding board with mod A.   Baseline not performing, using bed pan   Time 4   Status Achieved  11/06/14:  min A in clinic, but not performing at home (pt would benefit from bariatic [facility])   OT SHORT TERM GOAL #4   Title Pt will perform LB dressing with mod A using AE prn.   Baseline mod-max A for LB dressing.   Time 4   Status Achieved   OT SHORT TERM GOAL #5   Title Pt will verbalize understanding of appropriate community resources prn.   Baseline dependent   Time 4   Period Weeks   Status Achieved   OT SHORT TERM GOAL #6   Title Pt will be mod I with upgraded HEP - 01/09/2015   Status  Achieved   OT SHORT TERM GOAL #7   Title Pt will be supervision with 3 in 1 commode transfers to larger padded commode   Status Achieved   OT SHORT TERM GOAL #8   Title Pt will be mod a for toilet hygiene using larger 3 in 1 commode   Status Achieved           OT Long Term Goals - 01/18/15 1656    OT LONG TERM GOAL #1   Title Pt will perform UB/LB bathing mod I once transfer to tub bench is completed using AE/DME prn. - 12/08/2014   Baseline dependent LB, bathing in bed   Time 8   Period Weeks   Status Deferred  pt is unable to access her bathroom at home   OT LONG TERM GOAL #2   Title Pt will peform sliding board transfer to tub bench with min A.   Baseline sponge bathing in bed, not performin   Time 8   Period Weeks   Status Achieved   OT LONG TERM GOAL #3   Title Pt to perform transfer to [facility] using sliding board with min A.  completed without sliding board   Baseline not peforming, using bed pan   Time 8   Period Weeks   Status Achieved   OT LONG TERM GOAL #4   Title Pt will perform toileting hygiene independently after bowel movement using BSC.- 02/06/2015   Baseline dependent   Time 8   Period Weeks   Status On-going   OT LONG TERM GOAL #5   Title Pt will stand with min a to hike pants for dressing using grab bar   Status On-going   OT LONG TERM GOAL #6   Title Pt will demonstrate ability to stand  with min a using only unilateral UE support in order to free one UE for simple functional tasks.   Status Achieved   OT LONG TERM GOAL #7   Title Pt will be mod I with upgraded HEP    Status On-going               Plan - 01/18/15 1656    Clinical Impression Statement Pt continues to make slow but steady progress toward goals - pt with severely decreased LE strength which is impacting standing balance. Pt to begin PT at end of this month.    Pt will benefit from skilled therapeutic intervention in order to improve on the following deficits (Retired) Decreased  knowledge of use of DME;Impaired flexibility;Decreased coordination;Decreased mobility;Improper body mechanics;Decreased  endurance;Decreased activity tolerance;Decreased range of motion;Decreased strength;Other (comment);Impaired UE functional use;Decreased balance   Rehab Potential Good   Clinical Impairments Affecting Rehab Potential severity of deficits   OT Frequency 2x / week   OT Duration 8 weeks   OT Treatment/Interventions Self-care/ADL training;Neuromuscular education;Therapeutic exercise;Patient/family education;DME and/or AE instruction;Passive range of motion;Manual Therapy;Therapeutic activities;Cryotherapy;Moist Heat;Energy conservation;Therapist, nutritional;Therapeutic exercises;Ultrasound   Plan functional mobility, sit to stand, UE strengthening, core strengthening,standing balance,    OT Home Exercise Plan Education Issued:  Voc Rehab/Independent Living Info; 11/06/14 Blue putty HEP (grip and pinch), w/c push-up     Consulted and Agree with Plan of Care Patient        Problem List Patient Active Problem List   Diagnosis Date Noted  . 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 Dysuria 09/04/2014  . Hypertension 05/01/2014  . Ingrown right big toenail 05/01/2014  . Hypokalemia 05/01/2014  . GERD (gastroesophageal reflux disease) 02/14/2014  . Dysphagia, pharyngoesophageal phase 02/06/2014  . Type 2 diabetes mellitus without complication ([place]) 62/44/6950  . Constipation   . Neurogenic bowel   . 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 Metastatic cancer to spine ([place]) 01/19/2014  . Rotator cuff tendonitis   . Acute postoperative respiratory failure ([place]) 01/09/2014  . 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 Leg weakness, bilateral 12/13/2013    Quay Burow, OTR/L 01/18/2015, 5:00 PM  [facility] [address] [place],  [place], [address] Phone: [phone]   Fax:  [phone]  Name: Sarah [name] MRN: [id number] Date of Birth: 09/05/1965

## 2015-01-25 ENCOUNTER — Encounter: Payer: Self-pay | Admitting: Occupational Therapy

## 2015-01-25 ENCOUNTER — Ambulatory Visit: Payer: BLUE CROSS/BLUE SHIELD | Attending: Family Medicine | Admitting: Occupational Therapy

## 2015-01-25 DIAGNOSIS — G8220 Paraplegia, unspecified: Secondary | ICD-10-CM | POA: Insufficient documentation

## 2015-01-25 DIAGNOSIS — M25619 Stiffness of unspecified shoulder, not elsewhere classified: Secondary | ICD-10-CM

## 2015-01-25 DIAGNOSIS — Z7409 Other reduced mobility: Secondary | ICD-10-CM | POA: Insufficient documentation

## 2015-01-25 DIAGNOSIS — R2991 Unspecified symptoms and signs involving the musculoskeletal system: Secondary | ICD-10-CM | POA: Diagnosis present

## 2015-01-25 DIAGNOSIS — R29818 Other symptoms and signs involving the nervous system: Secondary | ICD-10-CM

## 2015-01-25 DIAGNOSIS — M6281 Muscle weakness (generalized): Secondary | ICD-10-CM | POA: Diagnosis present

## 2015-01-25 NOTE — Patient Instructions (Signed)
Theraband exercises with green theraband, push ups as well from wheelchair.

## 2015-01-25 NOTE — Therapy (Signed)
[facility] [address] [place], [place], [address] Phone: [phone]   Fax:  [phone]  Occupational Therapy Treatment  Patient Details  Name: Sarah [name] MRN: [id number] Date of Birth: [birth year] No Data Recorded  Encounter Date: 01/25/2015      OT End of Session - 01/25/15 1722    Visit Number 23   Number of Visits 30   Date for OT Re-Evaluation 02/06/15   Authorization - Visit Number 23   Authorization - Number of Visits 30   OT Start Time 7371   OT Stop Time 1400   OT Time Calculation (min) 43 min   Activity Tolerance Patient tolerated treatment well      Past Medical History  Diagnosis Date  . Hypertension   . DDD (degenerative disc disease), cervical   . DJD (degenerative joint disease)   . Obesity   . Chronic back pain   . 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 History of rectal fissure   . Rotator cuff tendonitis right    Past Surgical History  Procedure Laterality Date  . Tonsillectomy    . Laminectomy N/A 12/14/2013    Procedure: THORACIC LAMINECTOMY FOR TUMOR THORACIC THREE;  Surgeon: Ashok Pall, MD;  Location: [facility] NEURO ORS;  Service: Neurosurgery;  Laterality: N/A;  THORACIC LAMINECTOMY FOR TUMOR THORACIC THREE  . Posterior lumbar fusion 4 level N/A 12/30/2013    Procedure: Thoracic one-Thoracic five posterior thoracic fusion with pedicle screws;  Surgeon: Ashok Pall, MD;  Location: [facility] NEURO ORS;  Service: Neurosurgery;  Laterality: N/A;  Thoracic one-Thoracic five posterior thoracic fusion with pedicle screws  . Thyroidectomy N/A 01/09/2014    Procedure: TOTAL THYROIDECTOMY;  Surgeon: Izora Gala, MD;  Location: [facility];  Service: ENT;  Laterality: N/A;    There were no vitals filed for this visit.  Visit Diagnosis:  Paraplegia ([place])  Muscle weakness (generalized)  Poor trunk control  Stiffness of joint, shoulder region, unspecified  laterality  Decreased functional mobility and endurance      Subjective Assessment - 01/25/15 1322    Subjective  I feel stiff all over.    Pertinent History s/p laminectomy 12/14/13, s/p lumbar fusion 12/30/13 by Dr. Cyndy Freeze (secondary to tumor)   Patient Stated Goals improve ADLs (bathing, toileting)   Currently in Pain? No/denies  stiffness but no pain                      OT Treatments/Exercises (OP) - 01/25/15 0001    Exercises   Exercises Shoulder   Shoulder Exercises: Seated   Other Seated Exercises Updated HEP for UE's as pt feels weights may be aggravating her back. Pt issued green theraband and also given written instructions with pictures for bilateral abduction, shoulder flexion and elbow extension. Pt also instructed in push ups in wheelchair with proper positioning for maximal benefit. Sister [name] also present. Hopefully bariatric commode to be delivered next week. Sister to bring in to outpatient center to ensure that pt can use equipment.                 OT Education - 01/25/15 1718    Education provided Yes   Education Details modified HEP for [employer]) Educated Patient;Other (comment)  sister   Methods Explanation;Demonstration;Verbal cues;Handout   Comprehension Verbalized understanding;Returned demonstration          OT Short Term Goals - 01/25/15  [place] #1   Title Independent with updated HEP for UE strengthening--check STGs 11/12/14   Baseline dependent   Time 4   Period Weeks   Status Achieved  pt reports she is doing theraband program with sister at home   OT [place] #2   Title Pt will peform UB bathing on edge of bed with min A.   Baseline mod A lying in bed   Status Achieved   OT SHORT TERM GOAL #3   Title Pt to perform transfer to [facility] using sliding board with mod A.   Baseline not performing, using bed pan   Time 4   Status Achieved  11/06/14:  min A in clinic, but not performing at  home (pt would benefit from bariatic [facility])   OT SHORT TERM GOAL #4   Title Pt will perform LB dressing with mod A using AE prn.   Baseline mod-max A for LB dressing.   Time 4   Status Achieved   OT SHORT TERM GOAL #5   Title Pt will verbalize understanding of appropriate community resources prn.   Baseline dependent   Time 4   Period Weeks   Status Achieved   OT SHORT TERM GOAL #6   Title Pt will be mod I with upgraded HEP - 01/09/2015   Status Achieved   OT SHORT TERM GOAL #7   Title Pt will be supervision with 3 in 1 commode transfers to larger padded commode   Status Achieved   OT SHORT TERM GOAL #8   Title Pt will be mod a for toilet hygiene using larger 3 in 1 commode   Status Achieved           OT Long Term Goals - 01/25/15 1720    OT LONG TERM GOAL #1   Title Pt will perform UB/LB bathing mod I once transfer to tub bench is completed using AE/DME prn. - 12/08/2014   Baseline dependent LB, bathing in bed   Time 8   Period Weeks   Status Deferred  pt is unable to access her bathroom at home   OT LONG TERM GOAL #2   Title Pt will peform sliding board transfer to tub bench with min A.   Baseline sponge bathing in bed, not performin   Time 8   Period Weeks   Status Achieved   OT LONG TERM GOAL #3   Title Pt to perform transfer to [facility] using sliding board with min A.  completed without sliding board   Baseline not peforming, using bed pan   Time 8   Period Weeks   Status Achieved   OT LONG TERM GOAL #4   Title Pt will perform toileting hygiene independently after bowel movement using BSC.- 02/06/2015   Baseline dependent   Time 8   Period Weeks   Status On-going   OT LONG TERM GOAL #5   Title Pt will stand with min a to hike pants for dressing using grab bar   Status On-going   OT LONG TERM GOAL #6   Title Pt will demonstrate ability to stand  with min a using only unilateral UE support in order to free one UE for simple functional tasks.   Status Achieved    OT LONG TERM GOAL #7   Title Pt will be mod I with upgraded HEP    Status On-going  Plan - 01/25/15 1721    Clinical Impression Statement Pt is progressing toward goals. Adjusted HEP for UE's to avoid back strain.    Pt will benefit from skilled therapeutic intervention in order to improve on the following deficits (Retired) Decreased knowledge of use of DME;Impaired flexibility;Decreased coordination;Decreased mobility;Improper body mechanics;Decreased endurance;Decreased activity tolerance;Decreased range of motion;Decreased strength;Other (comment);Impaired UE functional use;Decreased balance   Rehab Potential Good   Clinical Impairments Affecting Rehab Potential severity of deficits   OT Frequency 2x / week   OT Duration 8 weeks   OT Treatment/Interventions Self-care/ADL training;Neuromuscular education;Therapeutic exercise;Patient/family education;DME and/or AE instruction;Passive range of motion;Manual Therapy;Therapeutic activities;Cryotherapy;Moist Heat;Energy conservation;Therapist, nutritional;Therapeutic exercises;Ultrasound   Plan check HEP, functional mobility, sit to stand, core strengthening, standing balance.    OT Home Exercise Plan Education Issued:  Voc Rehab/Independent Living Info; 11/06/14 Blue putty HEP (grip and pinch), w/c push-up     Consulted and Agree with Plan of Care Patient   Family Member Consulted sisterIvin Booty        Problem List Patient Active Problem List   Diagnosis Date Noted  . 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 Dysuria 09/04/2014  . Hypertension 05/01/2014  . Ingrown right big toenail 05/01/2014  . Hypokalemia 05/01/2014  . GERD (gastroesophageal reflux disease) 02/14/2014  . Dysphagia, pharyngoesophageal phase 02/06/2014  . Type 2 diabetes mellitus without complication ([place]) 37/62/8315  . Constipation   . Neurogenic bowel   . 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 Metastatic cancer to spine ([place])  01/19/2014  . Rotator cuff tendonitis   . Acute postoperative respiratory failure ([place]) 01/09/2014  . 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 Leg weakness, bilateral 12/13/2013    Quay Burow, OTR/L  01/25/2015, 5:23 PM  [facility] [address] [place], [place], [address] Phone: [phone]   Fax:  [phone]  Name: Sarah [name] MRN: [id number] Date of Birth: June 17, 1965

## 2015-01-29 ENCOUNTER — Encounter: Payer: Self-pay | Admitting: Occupational Therapy

## 2015-01-29 ENCOUNTER — Ambulatory Visit: Payer: BLUE CROSS/BLUE SHIELD | Admitting: Occupational Therapy

## 2015-01-29 DIAGNOSIS — R29818 Other symptoms and signs involving the nervous system: Secondary | ICD-10-CM

## 2015-01-29 DIAGNOSIS — M6281 Muscle weakness (generalized): Secondary | ICD-10-CM

## 2015-01-29 DIAGNOSIS — G8220 Paraplegia, unspecified: Secondary | ICD-10-CM

## 2015-01-29 DIAGNOSIS — Z7409 Other reduced mobility: Secondary | ICD-10-CM

## 2015-01-29 DIAGNOSIS — R2991 Unspecified symptoms and signs involving the musculoskeletal system: Secondary | ICD-10-CM

## 2015-01-29 DIAGNOSIS — M25619 Stiffness of unspecified shoulder, not elsewhere classified: Secondary | ICD-10-CM

## 2015-01-29 NOTE — Therapy (Signed)
[facility] [address] [place], [place], [address] Phone: [phone]   Fax:  [phone]  Occupational Therapy Treatment  Patient Details  Name: Sarah [name] MRN: [id number] Date of Birth: 15-Mar-1966 No Data Recorded  Encounter Date: 01/29/2015      OT End of Session - 01/29/15 1428    Visit Number 24   Number of Visits 30   Date for OT Re-Evaluation 02/06/15   Authorization Type BCBS primary (21 OT visits) / medicaid secondary (6 OT medicaid visits used, 3 previous BCBS visits used)   Authorization - Visit Number 24   Authorization - Number of Visits 30   OT Start Time 6962   OT Stop Time 1401   OT Time Calculation (min) 44 min   Activity Tolerance Patient tolerated treatment well      Past Medical History  Diagnosis Date  . Hypertension   . DDD (degenerative disc disease), cervical   . DJD (degenerative joint disease)   . Obesity   . Chronic back pain   . 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 History of rectal fissure   . Rotator cuff tendonitis right    Past Surgical History  Procedure Laterality Date  . Tonsillectomy    . Laminectomy N/A 12/14/2013    Procedure: THORACIC LAMINECTOMY FOR TUMOR THORACIC THREE;  Surgeon: Ashok Pall, MD;  Location: [facility]UR[facility] ORS;  Service: Neurosurgery;  Laterality: N/A;  THORACIC LAMINECTOMY FOR TUMOR THORACIC THREE  . Posterior lumbar fusion 4 level N/A 12/30/2013    Procedure: Thoracic one-Thoracic five posterior thoracic fusion with pedicle screws;  Surgeon: Ashok Pall, MD;  Location: [facility] NEURO ORS;  Service: Neurosurgery;  Laterality: N/A;  Thoracic one-Thoracic five posterior thoracic fusion with pedicle screws  . Thyroidectomy N/A 01/09/2014    Procedure: TOTAL THYROIDECTOMY;  Surgeon: Izora Gala, MD;  Location: [facility];  Service: ENT;  Laterality: N/A;    There were no vitals filed for this visit.  Visit Diagnosis:   Paraplegia ([place])  Muscle weakness (generalized)  Poor trunk control  Stiffness of joint, shoulder region, unspecified laterality  Decreased functional mobility and endurance      Subjective Assessment - 01/29/15 1323    Subjective  The weather always makes my back ache.    [name] accompained by: --  PCA   Pertinent History s/p laminectomy 12/14/13, s/p lumbar fusion 12/30/13 by Dr. Cyndy Freeze (secondary to tumor)   Patient Stated Goals improve ADLs (bathing, toileting)   Currently in Pain? Yes   Pain Score 2    Pain Location Back   Pain Orientation Mid   Pain Descriptors / Indicators Aching   Pain Type Chronic pain   Pain Onset More than a month ago   Pain Frequency Intermittent   Aggravating Factors  I think it is the weather   Pain Relieving Factors meds, rest, stretching                      OT Treatments/Exercises (OP) - 01/29/15 0001    Shoulder Exercises: Seated   Other Seated Exercises Addressed UE strength using free weights of 2 pounds in semi reclined position on mat.  15 reps x2 for shoulder flexion, 15 reps x2 for horizontal abduction. Pt able to complete with effort.  Also addressed UE strength through functional transfers - pt much better able to lift buttocks up during multi squat transfer using both UE  and LE's.  Current wheelchair makes transfers difficult as pt is not able to get feet on ground (uses block in clinic) or truly get weight over feet due to angle of the seat. PT to order custom manual chair which should assist with functional transfers and sit to stand.    Neurological Re-education Exercises   Other Exercises 1 Neuro re ed to address core strength using semi reclined position with unilteral and bilateral reach both lowering into semi recline as well as coming forward out of semi recline. Initially pt needed min a however with practice and repetition pt able to eventually complete task with vc's and additional time and effort only.                    OT Short Term Goals - 01/29/15 1414    OT SHORT TERM GOAL #1   Title Independent with updated HEP for UE strengthening--check STGs 11/12/14   Baseline dependent   Time 4   Period Weeks   Status Achieved  pt reports she is doing theraband program with sister at home   OT [place] #2   Title Pt will peform UB bathing on edge of bed with min A.   Baseline mod A lying in bed   Status Achieved   OT SHORT TERM GOAL #3   Title Pt to perform transfer to [facility] using sliding board with mod A.   Baseline not performing, using bed pan   Time 4   Status Achieved  11/06/14:  min A in clinic, but not performing at home (pt would benefit from bariatic [facility])   OT SHORT TERM GOAL #4   Title Pt will perform LB dressing with mod A using AE prn.   Baseline mod-max A for LB dressing.   Time 4   Status Achieved   OT SHORT TERM GOAL #5   Title Pt will verbalize understanding of appropriate community resources prn.   Baseline dependent   Time 4   Period Weeks   Status Achieved   OT SHORT TERM GOAL #6   Title Pt will be mod I with upgraded HEP - 01/09/2015   Status Achieved   OT SHORT TERM GOAL #7   Title Pt will be supervision with 3 in 1 commode transfers to larger padded commode   Status Achieved   OT SHORT TERM GOAL #8   Title Pt will be mod a for toilet hygiene using larger 3 in 1 commode   Status Achieved           OT Long Term Goals - 01/29/15 1415    OT LONG TERM GOAL #1   Title Pt will perform UB/LB bathing mod I once transfer to tub bench is completed using AE/DME prn. - 12/08/2014   Baseline dependent LB, bathing in bed   Time 8   Period Weeks   Status Deferred  pt is unable to access her bathroom at home   OT LONG TERM GOAL #2   Title Pt will peform sliding board transfer to tub bench with min A.   Baseline sponge bathing in bed, not performin   Time 8   Period Weeks   Status Achieved   OT LONG TERM GOAL #3   Title Pt to perform transfer to  [facility] using sliding board with min A.  completed without sliding board   Baseline not peforming, using bed pan   Time 8   Period Weeks   Status Achieved  OT LONG TERM GOAL #4   Title Pt will perform toileting hygiene independently after bowel movement using BSC.- 02/06/2015   Baseline dependent   Time 8   Period Weeks   Status On-going   OT LONG TERM GOAL #5   Title Pt will stand with min a to hike pants for dressing using grab bar   Status On-going   OT LONG TERM GOAL #6   Title Pt will demonstrate ability to stand  with min a using only unilateral UE support in order to free one UE for simple functional tasks.   Status Achieved   OT LONG TERM GOAL #7   Title Pt will be mod I with upgraded HEP    Status On-going               Plan - 01/29/15 1415    Clinical Impression Statement Pt continues to progress in terms of UE strength, core strength, functional mobility and activity tolerance. Bariatric commode due to be delivered this week and pt and sister to bring in seat in to next session to ensure equipment will work for pt.    Pt will benefit from skilled therapeutic intervention in order to improve on the following deficits (Retired) Decreased knowledge of use of DME;Impaired flexibility;Decreased coordination;Decreased mobility;Improper body mechanics;Decreased endurance;Decreased activity tolerance;Decreased range of motion;Decreased strength;Other (comment);Impaired UE functional use;Decreased balance   Rehab Potential Good   Clinical Impairments Affecting Rehab Potential severity of deficits   OT Frequency 2x / week   OT Duration 8 weeks   OT Treatment/Interventions Self-care/ADL training;Neuromuscular education;Therapeutic exercise;Patient/family education;DME and/or AE instruction;Passive range of motion;Manual Therapy;Therapeutic activities;Cryotherapy;Moist Heat;Energy conservation;Therapist, nutritional;Therapeutic exercises;Ultrasound   Plan practice transfer to  pt's new commode seat, simulate hygiene with AE. Pt will need to purchase AE for hygiene.    OT Home Exercise Plan Education Issued:  Voc Rehab/Independent Living Info; 11/06/14 Blue putty HEP (grip and pinch), w/c push-up     Consulted and Agree with Plan of Care Patient;Family member/caregiver   Family Member Consulted PCA        Problem List Patient Active Problem List   Diagnosis Date Noted  . 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 Dysuria 09/04/2014  . Hypertension 05/01/2014  . Ingrown right big toenail 05/01/2014  . Hypokalemia 05/01/2014  . GERD (gastroesophageal reflux disease) 02/14/2014  . Dysphagia, pharyngoesophageal phase 02/06/2014  . Type 2 diabetes mellitus without complication ([place]) 65/05/5463  . Constipation   . Neurogenic bowel   . 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 ([place]) 01/20/2014  . Metastatic cancer to spine ([place]) 01/19/2014  . Rotator cuff tendonitis   . Acute postoperative respiratory failure ([place]) 01/09/2014  . 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 Leg weakness, bilateral 12/13/2013    Quay Burow, OTR/L 01/29/2015, 2:31 PM  [facility] [address] [place], [place], [address] Phone: [phone]   Fax:  [phone]  Name: NERIA PROCTER MRN: [id number] Date of Birth: 22-Feb-1966

## 2015-02-01 ENCOUNTER — Other Ambulatory Visit: Payer: Self-pay | Admitting: *Deleted

## 2015-02-02 MED ORDER — SERTRALINE HCL 25 MG PO TABS: 25.0000 mg | ORAL_TABLET | Freq: Every day | ORAL | Status: DC

## 2015-02-05 ENCOUNTER — Other Ambulatory Visit: Payer: Self-pay | Admitting: Family Medicine

## 2015-02-05 ENCOUNTER — Encounter: Payer: Self-pay | Admitting: Occupational Therapy

## 2015-02-05 ENCOUNTER — Ambulatory Visit: Payer: BLUE CROSS/BLUE SHIELD | Admitting: Occupational Therapy

## 2015-02-05 DIAGNOSIS — R29818 Other symptoms and signs involving the nervous system: Secondary | ICD-10-CM

## 2015-02-05 DIAGNOSIS — R2991 Unspecified symptoms and signs involving the musculoskeletal system: Secondary | ICD-10-CM

## 2015-02-05 DIAGNOSIS — G8220 Paraplegia, unspecified: Secondary | ICD-10-CM

## 2015-02-05 DIAGNOSIS — M6281 Muscle weakness (generalized): Secondary | ICD-10-CM

## 2015-02-05 NOTE — Therapy (Signed)
[facility] [address] [place], [place], [address] Phone: [phone]   Fax:  [phone]  Occupational Therapy Treatment  Patient Details  Name: Sarah [name] MRN: [id number] Date of Birth: 1965/05/01 Referring Provider: Dr Alger Simons  Encounter Date: 02/05/2015      OT End of Session - 02/05/15 1416    Visit Number 25   Number of Visits 30   Date for OT Re-Evaluation 02/06/15   Authorization Type BCBS primary (8 OT visits) / medicaid secondary (6 OT medicaid visits used, 3 previous BCBS visits used)   Authorization - Visit Number 25   Authorization - Number of Visits 30   OT Start Time 1318   OT Stop Time 1403   OT Time Calculation (min) 45 min   Equipment Utilized During Treatment wide drop arm commode   Activity Tolerance Patient tolerated treatment well   Behavior During Therapy WFL for tasks assessed/performed      Past Medical History  Diagnosis Date  . Hypertension   . DDD (degenerative disc disease), cervical   . DJD (degenerative joint disease)   . Obesity   . Chronic back pain   . 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 History of rectal fissure   . Rotator cuff tendonitis right    Past Surgical History  Procedure Laterality Date  . Tonsillectomy    . Laminectomy N/A 12/14/2013    Procedure: THORACIC LAMINECTOMY FOR TUMOR THORACIC THREE;  Surgeon: Ashok Pall, MD;  Location: [facility] NEURO ORS;  Service: Neurosurgery;  Laterality: N/A;  THORACIC LAMINECTOMY FOR TUMOR THORACIC THREE  . Posterior lumbar fusion 4 level N/A 12/30/2013    Procedure: Thoracic one-Thoracic five posterior thoracic fusion with pedicle screws;  Surgeon: Ashok Pall, MD;  Location: [place] [facility] NEURO ORS;  Service: Neurosurgery;  Laterality: N/A;  Thoracic one-Thoracic five posterior thoracic fusion with pedicle screws  . Thyroidectomy N/A 01/09/2014    Procedure: TOTAL THYROIDECTOMY;   Surgeon: Izora Gala, MD;  Location: [facility];  Service: ENT;  Laterality: N/A;    There were no vitals filed for this visit.  Visit Diagnosis:  Paraplegia ([place]) - Plan: Ot plan of care cert/re-cert  Muscle weakness (generalized) - Plan: Ot plan of care cert/re-cert  Poor trunk control - Plan: Ot plan of care cert/re-cert      Subjective Assessment - 02/05/15 1410    Subjective  I was able to move my leg over - across the other one in the bed this weekend!   Pertinent History s/p laminectomy 12/14/13, s/p lumbar fusion 12/30/13 by Dr. Cyndy Freeze (secondary to tumor)   Patient Stated Goals improve ADLs (bathing, toileting)   Currently in Pain? No/denies   Pain Score 0-No pain            [facility] OT Assessment - 02/05/15 0001    Assessment   Referring Provider Dr Alger Simons                  OT Treatments/Exercises (OP) - 02/05/15 0001    ADLs   Toileting Practiced level transfers wheelchair to wide bedside commode.  Problem solved through placement in bedroom - until manual wheelchair arrives, power chair does not fit into bathroom.  Practiced two techniques - with and without 4 inch block under feet. Most effective transfer was with encouragement for big weight shift if trunk forward to weight down feet.  Did better with block under feet for  more stable contact of feet to surface to aide with pushing.  Practiced technique for hygiene (will need AE) and clothing management both up and down.  Patient's sister present for this session, and expressing frustration at not yet receiving needed bathroom DME.  Reinforced with patient and sister that her equipment will be different, and that once they obtain it - it will be beneficial to bring to therapy for more task specific practice.                  OT Education - 02/05/15 1415    Education provided Yes   Education Details toilet transfer from wheelchair, Probation officer, hygiene on bedside commode   Person(s) Educated  Patient;Caregiver(s)   Methods Explanation;Demonstration   Comprehension Verbalized understanding;Returned demonstration          OT Short Term Goals - 01/29/15 1414    OT SHORT TERM GOAL #1   Title Independent with updated HEP for UE strengthening--check STGs 11/12/14   Baseline dependent   Time 4   Period Weeks   Status Achieved  pt reports she is doing theraband program with sister at home   OT [place] #2   Title Pt will peform UB bathing on edge of bed with min A.   Baseline mod A lying in bed   Status Achieved   OT SHORT TERM GOAL #3   Title Pt to perform transfer to [facility] using sliding board with mod A.   Baseline not performing, using bed pan   Time 4   Status Achieved  11/06/14:  min A in clinic, but not performing at home (pt would benefit from bariatic [facility])   OT SHORT TERM GOAL #4   Title Pt will perform LB dressing with mod A using AE prn.   Baseline mod-max A for LB dressing.   Time 4   Status Achieved   OT SHORT TERM GOAL #5   Title Pt will verbalize understanding of appropriate community resources prn.   Baseline dependent   Time 4   Period Weeks   Status Achieved   OT SHORT TERM GOAL #6   Title Pt will be mod I with upgraded HEP - 01/09/2015   Status Achieved   OT SHORT TERM GOAL #7   Title Pt will be supervision with 3 in 1 commode transfers to larger padded commode   Status Achieved   OT SHORT TERM GOAL #8   Title Pt will be mod a for toilet hygiene using larger 3 in 1 commode   Status Achieved           OT Long Term Goals - 02/05/15 1421    OT LONG TERM GOAL #1   Title Pt will perform UB/LB bathing mod I once transfer to tub bench is completed using AE/DME prn. - 12/08/2014   Status Deferred   OT LONG TERM GOAL #2   Title Pt will peform sliding board transfer to tub bench with min A.   Status Achieved   OT LONG TERM GOAL #3   Title Pt to perform transfer to [facility] using sliding board with min A.   Status Achieved   OT LONG TERM GOAL #4    Title Pt will perform toileting hygiene independently after bowel movement using BSC.- 02/06/2015   Status On-going   OT LONG TERM GOAL #5   Title Pt will stand with min a to hike pants for dressing using grab bar   Status On-going   OT  LONG TERM GOAL #6   Title Pt will demonstrate ability to stand  with min a using only unilateral UE support in order to free one UE for simple functional tasks.   Status Achieved   OT LONG TERM GOAL #7   Title Pt will be mod I with upgraded HEP    Status On-going               Plan - 02/05/15 1418    Clinical Impression Statement Patient continues to show functional progress in all areas of ADL due to improved UE/LE/Core strength, muscle endurance, overall improved conditioning, decreased pain, and continued motivation adn family support.  Hope to have bathroom DME present for next OT session.   Pt will benefit from skilled therapeutic intervention in order to improve on the following deficits (Retired) Decreased knowledge of use of DME;Impaired flexibility;Decreased coordination;Decreased mobility;Improper body mechanics;Decreased endurance;Decreased activity tolerance;Decreased range of motion;Decreased strength;Other (comment);Impaired UE functional use;Decreased balance   Rehab Potential Good   Clinical Impairments Affecting Rehab Potential severity of deficits   OT Frequency 2x / week   OT Duration 8 weeks   OT Treatment/Interventions Self-care/ADL training;Neuromuscular education;Therapeutic exercise;Patient/family education;DME and/or AE instruction;Passive range of motion;Manual Therapy;Therapeutic activities;Cryotherapy;Moist Heat;Energy conservation;Therapist, nutritional;Therapeutic exercises;Ultrasound   Plan practice transfer to new commode seat, hygien and clothing management   OT Home Exercise Plan techniques for toileting, set up, schedule   Consulted and Agree with Plan of Care Patient;Family member/caregiver   Family Member  Consulted sister [name]        Problem List Patient Active Problem List   Diagnosis Date Noted  . 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 Dysuria 09/04/2014  . Hypertension 05/01/2014  . Ingrown right big toenail 05/01/2014  . Hypokalemia 05/01/2014  . GERD (gastroesophageal reflux disease) 02/14/2014  . Dysphagia, pharyngoesophageal phase 02/06/2014  . Type 2 diabetes mellitus without complication ([place]) 73/53/2992  . Constipation   . Neurogenic bowel   . 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 Metastatic cancer to spine ([place]) 01/19/2014  . Rotator cuff tendonitis   . Acute postoperative respiratory failure ([place]) 01/09/2014  . 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 Leg weakness, bilateral 12/13/2013    Mariah Milling , OTR/L  02/05/2015, 2:31 PM  [facility] [address] [place], [place], [address] Phone: [phone]   Fax:  [phone]  Name: Sarah [name] MRN: [id number] Date of Birth: 1965-12-29

## 2015-02-09 ENCOUNTER — Other Ambulatory Visit: Payer: Self-pay | Admitting: Obstetrics & Gynecology

## 2015-02-09 ENCOUNTER — Other Ambulatory Visit (HOSPITAL_COMMUNITY)
Admission: RE | Admit: 2015-02-09 | Discharge: 2015-02-09 | Disposition: A | Discharge status: Home / Self Care | Payer: BLUE CROSS/BLUE SHIELD | Source: Ambulatory Visit | Attending: Obstetrics & Gynecology | Admitting: Obstetrics & Gynecology

## 2015-02-09 DIAGNOSIS — Z01411 Encounter for gynecological examination (general) (routine) with abnormal findings: Secondary | ICD-10-CM | POA: Insufficient documentation

## 2015-02-09 DIAGNOSIS — Z1151 Encounter for screening for human papillomavirus (HPV): Secondary | ICD-10-CM | POA: Insufficient documentation

## 2015-02-12 LAB — CYTOLOGY - PAP

## 2015-02-13 ENCOUNTER — Ambulatory Visit: Payer: BLUE CROSS/BLUE SHIELD | Admitting: Occupational Therapy

## 2015-02-13 ENCOUNTER — Encounter: Payer: Self-pay | Admitting: Occupational Therapy

## 2015-02-13 ENCOUNTER — Other Ambulatory Visit (HOSPITAL_COMMUNITY): Payer: Self-pay | Admitting: Obstetrics & Gynecology

## 2015-02-13 ENCOUNTER — Other Ambulatory Visit: Payer: Self-pay | Admitting: Obstetrics & Gynecology

## 2015-02-13 DIAGNOSIS — R29818 Other symptoms and signs involving the nervous system: Secondary | ICD-10-CM

## 2015-02-13 DIAGNOSIS — R2991 Unspecified symptoms and signs involving the musculoskeletal system: Secondary | ICD-10-CM

## 2015-02-13 DIAGNOSIS — M6281 Muscle weakness (generalized): Secondary | ICD-10-CM

## 2015-02-13 DIAGNOSIS — N950 Postmenopausal bleeding: Secondary | ICD-10-CM

## 2015-02-13 DIAGNOSIS — Z7409 Other reduced mobility: Secondary | ICD-10-CM

## 2015-02-13 DIAGNOSIS — G8220 Paraplegia, unspecified: Secondary | ICD-10-CM

## 2015-02-13 DIAGNOSIS — M25619 Stiffness of unspecified shoulder, not elsewhere classified: Secondary | ICD-10-CM

## 2015-02-13 NOTE — Therapy (Signed)
[facility] [address] [place], [place], [address] Phone: [phone]   Fax:  [phone]  Occupational Therapy Treatment  Patient Details  Name: Sarah [name] MRN: [id number] Date of Birth: [birth year] Referring Provider: Dr Alger Simons  Encounter Date: 02/13/2015      OT End of Session - 02/13/15 1718    Visit Number 26   Number of Visits 30   Date for OT Re-Evaluation 03/14/15   Authorization Type BCBS primary (23 OT visits) / medicaid secondary (6 OT medicaid visits used, 3 previous BCBS visits used)   Authorization - Visit Number 26   Authorization - Number of Visits 30   OT Start Time 1532   OT Stop Time 1613   OT Time Calculation (min) 41 min   Activity Tolerance Patient tolerated treatment well      Past Medical History  Diagnosis Date  . Hypertension   . DDD (degenerative disc disease), cervical   . DJD (degenerative joint disease)   . Obesity   . Chronic back pain   . 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 History of rectal fissure   . Rotator cuff tendonitis right    Past Surgical History  Procedure Laterality Date  . Tonsillectomy    . Laminectomy N/A 12/14/2013    Procedure: THORACIC LAMINECTOMY FOR TUMOR THORACIC THREE;  Surgeon: Ashok Pall, MD;  Location: [facility] [facility];  Service: Neurosurgery;  Laterality: N/A;  THORACIC LAMINECTOMY FOR TUMOR THORACIC THREE  . Posterior lumbar fusion 4 level N/A 12/30/2013    Procedure: Thoracic one-Thoracic five posterior thoracic fusion with pedicle screws;  Surgeon: Ashok Pall, MD;  Location: [facility] [facility]UR[facility];  Service: Neurosurgery;  Laterality: N/A;  Thoracic one-Thoracic five posterior thoracic fusion with pedicle screws  . Thyroidectomy N/A 01/09/2014    Procedure: TOTAL THYROIDECTOMY;  Surgeon: Izora Gala, MD;  Location: [facility];  Service: ENT;  Laterality: N/A;    There were no vitals filed for this  visit.  Visit Diagnosis:  Paraplegia (HCC)  Muscle weakness (generalized)  Poor trunk control  Stiffness of joint, shoulder region, unspecified laterality  Decreased functional mobility and endurance      Subjective Assessment - 02/13/15 1537    Subjective  We have been using the commode since Sunday   [name] accompained by: Family member  wife   Pertinent History s/p laminectomy 12/14/13, s/p lumbar fusion 12/30/13 by Dr. Cyndy Freeze (secondary to tumor)   Patient Stated Goals improve ADLs (bathing, toileting)   Currently in Pain? No/denies                      OT Treatments/Exercises (OP) - 02/13/15 0001    ADLs   Toileting Discussed possible AE for toilet hygiene - pt and sister given pictures of alternative solutions and copied for patient.  Pt also given possible places to obtain equipment. Pt to follow up and purchase toileting aide for hygiene. Pt reports she has been using new drop arm commode at home and is now able to scoot pants down with supervision and scoot pants up with mod a.  Pt completing transfer to commode with block for feet with distant supervision.  Discused plan for moving forward and at this time will place pt on hold to allow pt work with PT to improve LE strength, core strength and standing in order for OT to address functional standing activities.  Pt in  agreement.  PT aware.                   OT Short Term Goals - 02/13/15 1715    OT SHORT TERM GOAL #1   Title Independent with updated HEP for UE strengthening--check STGs 11/12/14   Baseline dependent   Time 4   Period Weeks   Status Achieved  pt reports she is doing theraband program with sister at home   OT [place] #2   Title Pt will peform UB bathing on edge of bed with min A.   Baseline mod A lying in bed   Status Achieved   OT SHORT TERM GOAL #3   Title Pt to perform transfer to [facility] using sliding board with mod A.   Baseline not performing, using bed pan   Time  4   Status Achieved  11/06/14:  min A in clinic, but not performing at home (pt would benefit from bariatic [facility])   OT SHORT TERM GOAL #4   Title Pt will perform LB dressing with mod A using AE prn.   Baseline mod-max A for LB dressing.   Time 4   Status Achieved   OT SHORT TERM GOAL #5   Title Pt will verbalize understanding of appropriate community resources prn.   Baseline dependent   Time 4   Period Weeks   Status Achieved   OT SHORT TERM GOAL #6   Title Pt will be mod I with upgraded HEP - 01/09/2015   Status Achieved   OT SHORT TERM GOAL #7   Title Pt will be supervision with 3 in 1 commode transfers to larger padded commode   Status Achieved   OT SHORT TERM GOAL #8   Title Pt will be mod a for toilet hygiene using larger 3 in 1 commode   Status Achieved           OT Long Term Goals - 02/13/15 1715    OT LONG TERM GOAL #1   Title Pt will perform UB/LB bathing mod I once transfer to tub bench is completed using AE/DME prn. - 12/08/2014   Status Deferred   OT LONG TERM GOAL #2   Title Pt will peform sliding board transfer to tub bench with min A.   Status Achieved   OT LONG TERM GOAL #3   Title Pt to perform transfer to [facility] using sliding board with min A.   Status Achieved   OT LONG TERM GOAL #4   Title Pt will perform toileting hygiene independently after bowel movement using BSC.- 02/06/2015   Status On-going   OT LONG TERM GOAL #5   Title Pt will stand with min a to hike pants for dressing using grab bar   Status On-going   OT LONG TERM GOAL #6   Title Pt will demonstrate ability to stand  with min a using only unilateral UE support in order to free one UE for simple functional tasks.   Status Achieved   OT LONG TERM GOAL #7   Title Pt will be mod I with upgraded HEP    Status Achieved               Plan - 02/13/15 1715    Clinical Impression Statement Pt has made excellent progress relative to toilet transfers, hygiene and clothing management. Pt to  be placed on hold to allow her to progress LE strenthening, core strengthening and standing with PT in order to advance  to functional standing activities with OT. Pt in agreement.    Pt will benefit from skilled therapeutic intervention in order to improve on the following deficits (Retired) Decreased knowledge of use of DME;Impaired flexibility;Decreased coordination;Decreased mobility;Improper body mechanics;Decreased endurance;Decreased activity tolerance;Decreased range of motion;Decreased strength;Other (comment);Impaired UE functional use;Decreased balance   Rehab Potential Good   Clinical Impairments Affecting Rehab Potential severity of deficits   OT Frequency 2x / week   OT Duration 8 weeks   OT Treatment/Interventions Self-care/ADL training;Neuromuscular education;Therapeutic exercise;Patient/family education;DME and/or AE instruction;Passive range of motion;Manual Therapy;Therapeutic activities;Cryotherapy;Moist Heat;Energy conservation;Therapist, nutritional;Therapeutic exercises;Ultrasound   Plan pt to be placed on hold - see comments above. PT to alert OT when pt is ready to resume OT to address functional standing activities. Pt in agreement.    Consulted and Agree with Plan of Care Patient;Family member/caregiver   Family Member Consulted sister [name]        Problem List Patient Active Problem List   Diagnosis Date Noted  . 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 Dysuria 09/04/2014  . Hypertension 05/01/2014  . Ingrown right big toenail 05/01/2014  . Hypokalemia 05/01/2014  . GERD (gastroesophageal reflux disease) 02/14/2014  . Dysphagia, pharyngoesophageal phase 02/06/2014  . Type 2 diabetes mellitus without complication ([place]) 70/78/6754  . Constipation   . Neurogenic bowel   . 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 ([place]) 01/20/2014  . Metastatic cancer to spine ([place]) 01/19/2014  . Rotator cuff tendonitis   . Acute postoperative respiratory failure ([place]) 01/09/2014  . Hurthle  cell neoplasm of thyroid 12/30/2013  . 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 Leg weakness, bilateral 12/13/2013    Quay Burow, OTR/L 02/13/2015, 5:22 PM  [facility] [address] [place], [place], [address] Phone: [phone]   Fax:  [phone]  Name: SHAWNYA MAYOR MRN: [id number] Date of Birth: 25-Mar-1965

## 2015-02-14 ENCOUNTER — Other Ambulatory Visit: Payer: Self-pay | Admitting: Family Medicine

## 2015-02-20 ENCOUNTER — Ambulatory Visit: Payer: BLUE CROSS/BLUE SHIELD | Admitting: Physical Therapy

## 2015-02-22 ENCOUNTER — Ambulatory Visit: Payer: BLUE CROSS/BLUE SHIELD | Attending: Family Medicine | Admitting: Physical Therapy

## 2015-02-22 DIAGNOSIS — G8220 Paraplegia, unspecified: Secondary | ICD-10-CM | POA: Diagnosis present

## 2015-02-22 DIAGNOSIS — G839 Paralytic syndrome, unspecified: Secondary | ICD-10-CM | POA: Diagnosis present

## 2015-02-22 DIAGNOSIS — Z7409 Other reduced mobility: Secondary | ICD-10-CM | POA: Diagnosis present

## 2015-02-22 DIAGNOSIS — M6281 Muscle weakness (generalized): Secondary | ICD-10-CM | POA: Diagnosis present

## 2015-02-25 ENCOUNTER — Encounter: Payer: Self-pay | Admitting: Physical Therapy

## 2015-02-25 NOTE — Therapy (Signed)
[facility] [address] [place], [place], [address] Phone: [phone]   Fax:  [phone]  Physical Therapy Treatment  Patient Details  Name: Sarah [name] MRN: [id number] Date of Birth: Jun 03, 1965 No Data Recorded  Encounter Date: 02/22/2015      PT End of Session - 02/25/15 1916    Visit Number 28   Number of Visits 35   Date for PT Re-Evaluation 03/24/15   Authorization Type BCBS   Authorization Time Period 03-24-14  - 03-24-15   Authorization - Visit Number 23   Authorization - Number of Visits 30   PT Start Time 0932   PT Stop Time 1017   PT Time Calculation (min) 45 min      Past Medical History  Diagnosis Date  . Hypertension   . DDD (degenerative disc disease), cervical   . DJD (degenerative joint disease)   . Obesity   . Chronic back pain   . 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 History of rectal fissure   . Rotator cuff tendonitis right    Past Surgical History  Procedure Laterality Date  . Tonsillectomy    . Laminectomy N/A 12/14/2013    Procedure: THORACIC LAMINECTOMY FOR TUMOR THORACIC THREE;  Surgeon: Ashok Pall, MD;  Location: [facility];  Service: Neurosurgery;  Laterality: N/A;  THORACIC LAMINECTOMY FOR TUMOR THORACIC THREE  . Posterior lumbar fusion 4 level N/A 12/30/2013    Procedure: Thoracic one-Thoracic five posterior thoracic fusion with pedicle screws;  Surgeon: Ashok Pall, MD;  Location: [facility];  Service: Neurosurgery;  Laterality: N/A;  Thoracic one-Thoracic five posterior thoracic fusion with pedicle screws  . Thyroidectomy N/A 01/09/2014    Procedure: TOTAL THYROIDECTOMY;  Surgeon: Izora Gala, MD;  Location: [facility];  Service: ENT;  Laterality: N/A;    There were no vitals filed for this visit.  Visit Diagnosis:  Muscle weakness (generalized) - Plan: PT plan of care cert/re-cert  Decreased functional mobility and  endurance - Plan: PT plan of care cert/re-cert  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 [facility]) - Plan: PT plan of care cert/re-cert      Subjective Assessment - 02/25/15 1910    Subjective Pt states she has been transferring by herself on/off bedside commode - says hips have gotten stronger because of this activity and the frequency of it; pt states legs are really tight due to spasms   [name] accompained by: Family member   Patient Stated Goals Improve mobility   Currently in Pain? No/denies                         [facility] Adult PT Treatment/Exercise - 02/25/15 0001    Bed Mobility   Bed Mobility Rolling Right;Rolling Left   Rolling Right 5: Set up   Rolling Left 5: Supervision   Supine to Sit 3: Mod assist   Sit to Supine 4: Min assist  with LE's   Transfers   Transfers Lateral/Scoot Transfers   Sit to Stand --   Lateral/Scoot Transfers 4: Min guard   Knee/Hip Exercises: Aerobic   Stationary Bike SciFit level 1.5 x 6" with UE and LE's   Knee/Hip Exercises: Supine   Heel Slides AAROM;10 reps;Both  10 sec hold at chest for stretching   Bridges AAROM;10 reps   Straight Leg Raises AAROM;Right;Left;10 reps   Knee/Hip Exercises: Prone   Hamstring Curl 1 set;10  reps  RLE and LLE     Trunk rotation stretch - 30 sec hold x 2 reps to R and L sides             PT Short Term Goals - 10/05/14 0557    PT SHORT TERM GOAL #1   Title Pt. will transfer with sliding board wheelchair to mat with +1 max assist.  (target date 08-31-14)   Status Achieved   PT SHORT TERM GOAL #2   Title Sit unsupported on side of mat x 5" with +2 mod assist   Status Achieved   PT SHORT TERM GOAL #3   Title Independent to direct HEP (stretches and PROM for bil. LE's)   Status Achieved   PT SHORT TERM GOAL #4   Title Tolerate standing in standing frame x 10" for LE stretching and weight-bearing   (08-31-14)   Status Achieved   PT SHORT TERM GOAL #5   Title Incr. endurance/activity tolerance so pt.  able to perform 10' nonstop on recumbent bike Comptroller)  (08-31-14)   Status On-going           PT Long Term Goals - 02/25/15 1923    PT LONG TERM GOAL #1   Title Transfer with sliding board with +1 mod assist wheelchair to/from mat   Status Achieved   PT LONG TERM GOAL #2   Title Sit on side of mat for at least 10" with UE support prn with SBA to incr. independence with dressing, grooming,etc. at home  (09-30-14)   Status Achieved   PT LONG TERM GOAL #3   Title Sit unsupported on side of mat with 1 UE support and reach 4" anteriorly with other UE support with min assist to increase independence with ADL's with mod  (09-30-14)   Status Achieved   PT LONG TERM GOAL #4   Title Independent in updated HEP  (09-30-14)   Status Achieved   PT LONG TERM GOAL #5   Title Pt. will transfer wheelchair to/from mat with sliding board with S only after set up for placement of board (11-04-14)   Status Achieved   Additional Long Term Goals   Additional Long Term Goals Yes   PT LONG TERM GOAL #6   Title Pt. will roll from supine to R and L modified independently on mat and will report ability to do so at home  (11-04-14)   Status Achieved   PT LONG TERM GOAL #7   Title Pt will transfer wheelchair to regular bed with sliding board with SBA per sister's report.  03-24-15   Time 4   Period Weeks   Status New   PT LONG TERM GOAL #8   Title Pt will report ability to roll supine to R and L sides in bed at home to avoid sleeping on back all night for reduced risk of skin breakdown.  03-24-15   Time 4   Period Weeks   Status New   PT LONG TERM GOAL  #9   TITLE Independent in updated HEP as appropriate.  03-24-15   Time 4   Period Weeks   Status New               Plan - 02/25/15 1920    Clinical Impression Statement Pt demonstrating increased AROM in bil. LE's with incr. extensor tone/spasticity; functional mobility is improving   Pt will benefit from skilled therapeutic intervention in order to  improve on the following deficits Decreased mobility;Obesity;Decreased range of  motion;Impaired flexibility;Increased muscle spasms;Decreased balance;Decreased strength   Rehab Potential Good   PT Frequency 2x / week   PT Duration 4 weeks   PT Treatment/Interventions ADLs/Self Care Home Management;Therapeutic activities;Patient/family education;Therapeutic exercise;Wheelchair mobility training;Balance training;Neuromuscular re-education;Functional mobility training   PT Next Visit Plan cont ther ex and mobility training   PT Home Exercise Plan added yellow theraband for LAQ's while seated in wheelchair   Consulted and Agree with Plan of Care Patient;Family member/caregiver   Family Member Consulted pt's sister-[name]        Problem List Patient Active Problem List   Diagnosis Date Noted  . 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 Dysuria 09/04/2014  . Hypertension 05/01/2014  . Ingrown right big toenail 05/01/2014  . Hypokalemia 05/01/2014  . GERD (gastroesophageal reflux disease) 02/14/2014  . Dysphagia, pharyngoesophageal phase 02/06/2014  . Type 2 diabetes mellitus without complication ([place]) 01/12/1172  . Constipation   . Neurogenic bowel   . 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 ([place]) 01/20/2014  . Metastatic cancer to spine ([place]) 01/19/2014  . Rotator cuff tendonitis   . Acute postoperative respiratory failure ([place]) 01/09/2014  . 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 Leg weakness, bilateral 12/13/2013    VAPOLI, DCVUD THYHOOI, PT 02/25/2015, 7:35 PM  [facility] [address] [place], [place], [address] Phone: [phone]   Fax:  [phone]  Name: Sarah [name] MRN: [id number] Date of Birth: 26-Jun-1965

## 2015-02-26 ENCOUNTER — Ambulatory Visit (HOSPITAL_COMMUNITY)
Admission: RE | Admit: 2015-02-26 | Discharge: 2015-02-26 | Disposition: A | Discharge status: Home / Self Care | Payer: BLUE CROSS/BLUE SHIELD | Source: Ambulatory Visit | Attending: Obstetrics & Gynecology | Admitting: Obstetrics & Gynecology

## 2015-02-26 DIAGNOSIS — N950 Postmenopausal bleeding: Secondary | ICD-10-CM | POA: Insufficient documentation

## 2015-02-27 ENCOUNTER — Ambulatory Visit: Payer: BLUE CROSS/BLUE SHIELD | Admitting: Physical Therapy

## 2015-03-01 ENCOUNTER — Ambulatory Visit: Payer: BLUE CROSS/BLUE SHIELD | Admitting: Physical Therapy

## 2015-03-06 ENCOUNTER — Ambulatory Visit: Payer: BLUE CROSS/BLUE SHIELD | Admitting: Physical Therapy

## 2015-03-08 ENCOUNTER — Ambulatory Visit: Payer: BLUE CROSS/BLUE SHIELD | Admitting: Physical Therapy

## 2015-03-08 DIAGNOSIS — E890 Postprocedural hypothyroidism: Secondary | ICD-10-CM | POA: Insufficient documentation

## 2015-03-08 DIAGNOSIS — C7951 Secondary malignant neoplasm of bone: Secondary | ICD-10-CM | POA: Insufficient documentation

## 2015-03-09 ENCOUNTER — Ambulatory Visit: Payer: BLUE CROSS/BLUE SHIELD

## 2015-03-13 ENCOUNTER — Ambulatory Visit: Payer: BLUE CROSS/BLUE SHIELD | Admitting: Physical Therapy

## 2015-03-13 DIAGNOSIS — M6281 Muscle weakness (generalized): Secondary | ICD-10-CM | POA: Diagnosis not present

## 2015-03-13 DIAGNOSIS — G8220 Paraplegia, unspecified: Secondary | ICD-10-CM

## 2015-03-14 ENCOUNTER — Encounter: Payer: Self-pay | Admitting: Physical Therapy

## 2015-03-14 NOTE — Therapy (Signed)
[facility] [address] [place], [place], [address] Phone: [phone]   Fax:  [phone]  Physical Therapy Treatment  Patient Details  Name: Sarah [name] MRN: [id number] Date of Birth: 04-11-1965 No Data Recorded  Encounter Date: 03/13/2015      PT End of Session - 03/14/15 1725    Visit Number 29   Number of Visits 35   Date for PT Re-Evaluation 03/24/15   Authorization Type BCBS   Authorization Time Period 03-24-14  - 03-24-15   Authorization - Visit Number 24   Authorization - Number of Visits 30   PT Start Time 0931   PT Stop Time 1020   PT Time Calculation (min) 49 min      Past Medical History  Diagnosis Date  . Hypertension   . DDD (degenerative disc disease), cervical   . DJD (degenerative joint disease)   . Obesity   . Chronic back pain   . 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 History of rectal fissure   . Rotator cuff tendonitis right    Past Surgical History  Procedure Laterality Date  . Tonsillectomy    . Laminectomy N/A 12/14/2013    Procedure: THORACIC LAMINECTOMY FOR TUMOR THORACIC THREE;  Surgeon: Ashok Pall, MD;  Location: [place] [facility];  Service: Neurosurgery;  Laterality: N/A;  THORACIC LAMINECTOMY FOR TUMOR THORACIC THREE  . Posterior lumbar fusion 4 level N/A 12/30/2013    Procedure: Thoracic one-Thoracic five posterior thoracic fusion with pedicle screws;  Surgeon: Ashok Pall, MD;  Location: [facility];  Service: Neurosurgery;  Laterality: N/A;  Thoracic one-Thoracic five posterior thoracic fusion with pedicle screws  . Thyroidectomy N/A 01/09/2014    Procedure: TOTAL THYROIDECTOMY;  Surgeon: Izora Gala, MD;  Location: [facility];  Service: ENT;  Laterality: N/A;    There were no vitals filed for this visit.  Visit Diagnosis:  Muscle weakness (generalized)  Paraplegia (HCC)      Subjective Assessment - 03/14/15 1721    Subjective Pt reports no new problems since last visit   [name] accompained by: Family member   Patient Stated Goals Improve mobility   Currently in Pain? No/denies                         [facility] Adult PT Treatment/Exercise - 03/14/15 0001    Bed Mobility   Bed Mobility Rolling Right;Rolling Left   Rolling Right 5: Set up   Rolling Left 5: Supervision   Supine to Sit 4: Min assist   Sit to Supine 5: Supervision   Transfers       Lateral/Scoot Transfers 4: Min guard   Knee/Hip Exercises: Aerobic   Stationary Bike SciFit level 1.5 x 6" with UE and LE's   Knee/Hip Exercises: Supine   Heel Slides AAROM;10 reps;Both  10 sec hold at chest for stretching   Bridges AAROM;10 reps   Straight Leg Raises AAROM;Right;Left;10 reps   Other Supine Knee/Hip Exercises trunk rotation stretch - both sides 30 sec hold   Knee/Hip Exercises: Sidelying   Hip ABduction AROM;Both;10 reps  in hooklying                  PT Short Term Goals - 10/05/14 0557    PT SHORT TERM GOAL #1   Title Pt. will transfer with sliding board wheelchair to mat with +1 max assist.  (target date  08-31-14)   Status Achieved   PT SHORT TERM GOAL #2   Title Sit unsupported on side of mat x 5" with +2 mod assist   Status Achieved   PT SHORT TERM GOAL #3   Title Independent to direct HEP (stretches and PROM for bil. LE's)   Status Achieved   PT SHORT TERM GOAL #4   Title Tolerate standing in standing frame x 10" for LE stretching and weight-bearing   (08-31-14)   Status Achieved   PT SHORT TERM GOAL #5   Title Incr. endurance/activity tolerance so pt. able to perform 10' nonstop on recumbent bike Comptroller)  (08-31-14)   Status On-going           PT Long Term Goals - 02/25/15 1923    PT LONG TERM GOAL #1   Title Transfer with sliding board with +1 mod assist wheelchair to/from mat   Status Achieved   PT LONG TERM GOAL #2   Title Sit on side of mat for at least 10" with UE support prn with  SBA to incr. independence with dressing, grooming,etc. at home  (09-30-14)   Status Achieved   PT LONG TERM GOAL #3   Title Sit unsupported on side of mat with 1 UE support and reach 4" anteriorly with other UE support with min assist to increase independence with ADL's with mod  (09-30-14)   Status Achieved   PT LONG TERM GOAL #4   Title Independent in updated HEP  (09-30-14)   Status Achieved   PT LONG TERM GOAL #5   Title Pt. will transfer wheelchair to/from mat with sliding board with S only after set up for placement of board (11-04-14)   Status Achieved   Additional Long Term Goals   Additional Long Term Goals Yes   PT LONG TERM GOAL #6   Title Pt. will roll from supine to R and L modified independently on mat and will report ability to do so at home  (11-04-14)   Status Achieved   PT LONG TERM GOAL #7   Title Pt will transfer wheelchair to regular bed with sliding board with SBA per sister's report.  03-24-15   Time 4   Period Weeks   Status New   PT LONG TERM GOAL #8   Title Pt will report ability to roll supine to R and L sides in bed at home to avoid sleeping on back all night for reduced risk of skin breakdown.  03-24-15   Time 4   Period Weeks   Status New   PT LONG TERM GOAL  #9   TITLE Independent in updated HEP as appropriate.  03-24-15   Time 4   Period Weeks   Status New               Plan - 03/14/15 1726    Clinical Impression Statement Pt improving with functional mobility - able to transfer wheelchair to mat with S only and able to transfer sit to supine with SBA and cues only without assistance required   Pt will benefit from skilled therapeutic intervention in order to improve on the following deficits Decreased mobility;Obesity;Decreased range of motion;Impaired flexibility;Increased muscle spasms;Decreased balance;Decreased strength   Rehab Potential Good   PT Frequency 2x / week   PT Duration 4 weeks   PT Treatment/Interventions ADLs/Self Care Home  Management;Therapeutic activities;Patient/family education;Therapeutic exercise;Wheelchair mobility training;Balance training;Neuromuscular re-education;Functional mobility training   PT Next Visit Plan cont ther ex and mobility training  Consulted and Agree with Plan of Care Patient;Family member/caregiver        Problem List Patient Active Problem List   Diagnosis Date Noted  . 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 Dysuria 09/04/2014  . Hypertension 05/01/2014  . Ingrown right big toenail 05/01/2014  . Hypokalemia 05/01/2014  . GERD (gastroesophageal reflux disease) 02/14/2014  . Dysphagia, pharyngoesophageal phase 02/06/2014  . Type 2 diabetes mellitus without complication ([place]) 34/28/7681  . Constipation   . Neurogenic bowel   . 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 Metastatic cancer to spine ([place]) 01/19/2014  . Rotator cuff tendonitis   . Acute postoperative respiratory failure ([place]) 01/09/2014  . 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 Leg weakness, bilateral 12/13/2013    [place], PT 03/14/2015, 5:29 PM  [facility] [address] [place], [place], [address] Phone: [phone]   Fax:  [phone]  Name: CHARLI HALLE MRN: [id number] Date of Birth: Aug 14, 1965

## 2015-03-15 ENCOUNTER — Ambulatory Visit: Payer: BLUE CROSS/BLUE SHIELD | Admitting: Physical Therapy

## 2015-03-15 DIAGNOSIS — M6281 Muscle weakness (generalized): Secondary | ICD-10-CM | POA: Diagnosis not present

## 2015-03-15 DIAGNOSIS — G8220 Paraplegia, unspecified: Secondary | ICD-10-CM

## 2015-03-16 ENCOUNTER — Other Ambulatory Visit: Payer: Self-pay | Admitting: Family Medicine

## 2015-03-19 ENCOUNTER — Encounter: Payer: Self-pay | Admitting: Physical Therapy

## 2015-03-19 NOTE — Therapy (Signed)
[facility] [address] [place], [place], [address] Phone: [phone]   Fax:  [phone]  Physical Therapy Treatment  Patient Details  Name: Sarah [name] MRN: [id number] Date of Birth: 14-Dec-1965 No Data Recorded  Encounter Date: 03/15/2015      PT End of Session - 03/19/15 1911    Visit Number 30   Number of Visits 35   Date for PT Re-Evaluation 03/24/15   Authorization Type BCBS   Authorization Time Period 03-24-14  - 03-24-15   Authorization - Visit Number 25   Authorization - Number of Visits 30   PT Start Time 1016   PT Stop Time 1102   PT Time Calculation (min) 46 min      Past Medical History  Diagnosis Date  . Hypertension   . DDD (degenerative disc disease), cervical   . DJD (degenerative joint disease)   . Obesity   . Chronic back pain   . 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 History of rectal fissure   . Rotator cuff tendonitis right    Past Surgical History  Procedure Laterality Date  . Tonsillectomy    . Laminectomy N/A 12/14/2013    Procedure: THORACIC LAMINECTOMY FOR TUMOR THORACIC THREE;  Surgeon: Ashok Pall, MD;  Location: [facility] NEURO ORS;  Service: Neurosurgery;  Laterality: N/A;  THORACIC LAMINECTOMY FOR TUMOR THORACIC THREE  . Posterior lumbar fusion 4 level N/A 12/30/2013    Procedure: Thoracic one-Thoracic five posterior thoracic fusion with pedicle screws;  Surgeon: Ashok Pall, MD;  Location: [place] NEUR[facility] ORS;  Service: Neurosurgery;  Laterality: N/A;  Thoracic one-Thoracic five posterior thoracic fusion with pedicle screws  . Thyroidectomy N/A 01/09/2014    Procedure: TOTAL THYROIDECTOMY;  Surgeon: Izora Gala, MD;  Location: [facility];  Service: ENT;  Laterality: N/A;    There were no vitals filed for this visit.  Visit Diagnosis:  Generalized muscle weakness  Paraplegia [facility])      Subjective Assessment - 03/19/15 1906    Subjective  Pt reports she was able to transfer LE's onto bed at home and transfer sit to supine   [name] accompained by: Family member   Patient Stated Goals Improve mobility   Currently in Pain? No/denies                         [facility] Adult PT Treatment/Exercise - 03/19/15 0001    Bed Mobility   Bed Mobility Rolling Right;Rolling Left   Rolling Right 5: Set up   Rolling Left 5: Supervision   Supine to Sit 4: Min assist   Sit to Supine 5: Supervision   Transfers   Transfers Lateral/Scoot Transfers   Sit to Stand 3: Mod assist;1: +2 Total assist   Lateral/Scoot Transfers 4: Min guard   Knee/Hip Exercises: Aerobic   Stationary Bike SciFit level 1.5 x 5" with UE and LE's   Knee/Hip Exercises: Supine   Heel Slides AAROM;10 reps;Both  10 sec hold at chest for stretching   Bridges AAROM;10 reps   Straight Leg Raises AAROM;Right;Left;10 reps   Other Supine Knee/Hip Exercises trunk rotation stretch - both sides 30 sec hold   Knee/Hip Exercises: Sidelying   Hip ABduction AROM;Both;10 reps  in hooklying                  PT Short Term Goals - 10/05/14 0557    PT SHORT  TERM GOAL #1   Title Pt. will transfer with sliding board wheelchair to mat with +1 max assist.  (target date 08-31-14)   Status Achieved   PT SHORT TERM GOAL #2   Title Sit unsupported on side of mat x 5" with +2 mod assist   Status Achieved   PT SHORT TERM GOAL #3   Title Independent to direct HEP (stretches and PROM for bil. LE's)   Status Achieved   PT SHORT TERM GOAL #4   Title Tolerate standing in standing frame x 10" for LE stretching and weight-bearing   (08-31-14)   Status Achieved   PT SHORT TERM GOAL #5   Title Incr. endurance/activity tolerance so pt. able to perform 10' nonstop on recumbent bike [employer])  (08-31-14)   Status On-going           PT Long Term Goals - 02/25/15 1923    PT LONG TERM GOAL #1   Title Transfer with sliding board with +1 mod assist wheelchair to/from mat    Status Achieved   PT LONG TERM GOAL #2   Title Sit on side of mat for at least 10" with UE support prn with SBA to incr. independence with dressing, grooming,etc. at home  (09-30-14)   Status Achieved   PT LONG TERM GOAL #3   Title Sit unsupported on side of mat with 1 UE support and reach 4" anteriorly with other UE support with min assist to increase independence with ADL's with mod  (09-30-14)   Status Achieved   PT LONG TERM GOAL #4   Title Independent in updated HEP  (09-30-14)   Status Achieved   PT LONG TERM GOAL #5   Title Pt. will transfer wheelchair to/from mat with sliding board with S only after set up for placement of board (11-04-14)   Status Achieved   Additional Long Term Goals   Additional Long Term Goals Yes   PT LONG TERM GOAL #6   Title Pt. will roll from supine to R and L modified independently on mat and will report ability to do so at home  (11-04-14)   Status Achieved   PT LONG TERM GOAL #7   Title Pt will transfer wheelchair to regular bed with sliding board with SBA per sister's report.  03-24-15   Time 4   Period Weeks   Status New   PT LONG TERM GOAL #8   Title Pt will report ability to roll supine to R and L sides in bed at home to avoid sleeping on back all night for reduced risk of skin breakdown.  03-24-15   Time 4   Period Weeks   Status New   PT LONG TERM GOAL  #9   TITLE Independent in updated HEP as appropriate.  03-24-15   Time 4   Period Weeks   Status New               Plan - 03/19/15 1913    Clinical Impression Statement Pt had incr. spasms today in RLE - some decr. noted after stretching and AAROM of RLE and trunk rotation stretch   Pt will benefit from skilled therapeutic intervention in order to improve on the following deficits Decreased mobility;Obesity;Decreased range of motion;Impaired flexibility;Increased muscle spasms;Decreased balance;Decreased strength   Rehab Potential Good   PT Frequency 2x / week   PT Duration 4 weeks    PT Treatment/Interventions ADLs/Self Care Home Management;Therapeutic activities;Patient/family education;Therapeutic exercise;Wheelchair mobility training;Balance training;Neuromuscular re-education;Functional mobility training  PT Next Visit Plan cont ther ex and mobility training   PT Home Exercise Plan added yellow theraband for LAQ's while seated in wheelchair   Consulted and Agree with Plan of Care Patient        Problem List Patient Active Problem List   Diagnosis Date Noted  . 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 Dysuria 09/04/2014  . Hypertension 05/01/2014  . Ingrown right big toenail 05/01/2014  . Hypokalemia 05/01/2014  . GERD (gastroesophageal reflux disease) 02/14/2014  . Dysphagia, pharyngoesophageal phase 02/06/2014  . Type 2 diabetes mellitus without complication ([place]) 41/96/2229  . Constipation   . Neurogenic bowel   . 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 ([place]) 01/20/2014  . Metastatic cancer to spine ([place]) 01/19/2014  . Rotator cuff tendonitis   . Acute postoperative respiratory failure ([place]) 01/09/2014  . 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 Leg weakness, bilateral 12/13/2013    NLGXQJ, JHERD EYCXKGY, PT 03/19/2015, 7:15 PM  [facility] [address] [place], [place], [address] Phone: [phone]   Fax:  [phone]  Name: Sarah [name] MRN: [id number] Date of Birth: [birth year]

## 2015-03-21 ENCOUNTER — Ambulatory Visit: Payer: BLUE CROSS/BLUE SHIELD | Admitting: Physical Therapy

## 2015-03-21 DIAGNOSIS — Z7409 Other reduced mobility: Secondary | ICD-10-CM

## 2015-03-21 DIAGNOSIS — M6281 Muscle weakness (generalized): Secondary | ICD-10-CM | POA: Diagnosis not present

## 2015-03-21 DIAGNOSIS — G8220 Paraplegia, unspecified: Secondary | ICD-10-CM

## 2015-03-22 ENCOUNTER — Encounter: Payer: Self-pay | Admitting: Physical Therapy

## 2015-03-22 NOTE — Therapy (Signed)
[facility] [address] [place], [place], [address] Phone: [phone]   Fax:  [phone]  Physical Therapy Treatment  Patient Details  Name: Sarah [name] MRN: [id number] Date of Birth: June 05, 1965 No Data Recorded  Encounter Date: 03/21/2015      PT End of Session - 03/22/15 2122    Visit Number 31   Number of Visits 35   Date for PT Re-Evaluation 03/24/15   Authorization Type BCBS   Authorization Time Period 03-24-14  - 03-24-15   Authorization - Visit Number 26   Authorization - Number of Visits 30   PT Start Time 6962   PT Stop Time 1446   PT Time Calculation (min) 40 min      Past Medical History  Diagnosis Date  . Hypertension   . DDD (degenerative disc disease), cervical   . DJD (degenerative joint disease)   . Obesity   . Chronic back pain   . 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 History of rectal fissure   . Rotator cuff tendonitis right    Past Surgical History  Procedure Laterality Date  . Tonsillectomy    . Laminectomy N/A 12/14/2013    Procedure: THORACIC LAMINECTOMY FOR TUMOR THORACIC THREE;  Surgeon: Ashok Pall, MD;  Location: [facility] NEURO ORS;  Service: Neurosurgery;  Laterality: N/A;  THORACIC LAMINECTOMY FOR TUMOR THORACIC THREE  . Posterior lumbar fusion 4 level N/A 12/30/2013    Procedure: Thoracic one-Thoracic five posterior thoracic fusion with pedicle screws;  Surgeon: Ashok Pall, MD;  Location: [facility] NEUR[facility] ORS;  Service: Neurosurgery;  Laterality: N/A;  Thoracic one-Thoracic five posterior thoracic fusion with pedicle screws  . Thyroidectomy N/A 01/09/2014    Procedure: TOTAL THYROIDECTOMY;  Surgeon: Izora Gala, MD;  Location: [facility];  Service: ENT;  Laterality: N/A;    There were no vitals filed for this visit.  Visit Diagnosis:  Muscle weakness (generalized)  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 (HCC)  Decreased functional mobility and  endurance      Subjective Assessment - 03/22/15 2116    Subjective Pt reports back is very tight today - having more spasms than usual   [name] accompained by: Family member   Patient Stated Goals Improve mobility   Currently in Pain? Other (Comment)  tightness/spasms                         [facility] Adult PT Treatment/Exercise - 03/22/15 0001    Bed Mobility   Bed Mobility Rolling Right;Rolling Left   Rolling Right 5: Set up   Rolling Left 5: Supervision   Supine to Sit 4: Min assist   Sit to Supine 5: Supervision   Transfers   Transfers Lateral/Scoot Transfers   Lateral/Scoot Transfers 4: Min guard   Knee/Hip Exercises: Aerobic   Stationary Bike SciFit level 1.5 x 5" with UE and LE's   Knee/Hip Exercises: Supine   Heel Slides AAROM;10 reps;Both  10 sec hold at chest for stretching   Bridges AAROM;10 reps   Other Supine Knee/Hip Exercises trunk rotation stretch - both sides 30 sec hold     Heel slides x 10 reps; hip flexion/extension x 10 reps with knee flexed; back extensor stretching with use of green swiss ball -  pushing forward with 30 sec hold, then rotating R  And L sides with 30 sec hold  Passive hip flexor stretching - R and  L in sidelying position - 30 sec hold x 2 reps Bil. Clam shell x 10 reps each leg             PT Short Term Goals - 10/05/14 0557    PT SHORT TERM GOAL #1   Title Pt. will transfer with sliding board wheelchair to mat with +1 max assist.  (target date 08-31-14)   Status Achieved   PT SHORT TERM GOAL #2   Title Sit unsupported on side of mat x 5" with +2 mod assist   Status Achieved   PT SHORT TERM GOAL #3   Title Independent to direct HEP (stretches and PROM for bil. LE's)   Status Achieved   PT SHORT TERM GOAL #4   Title Tolerate standing in standing frame x 10" for LE stretching and weight-bearing   (08-31-14)   Status Achieved   PT SHORT TERM GOAL #5   Title Incr. endurance/activity tolerance so pt. able to  perform 10' nonstop on recumbent bike Comptroller)  (08-31-14)   Status On-going           PT Long Term Goals - 02/25/15 1923    PT LONG TERM GOAL #1   Title Transfer with sliding board with +1 mod assist wheelchair to/from mat   Status Achieved   PT LONG TERM GOAL #2   Title Sit on side of mat for at least 10" with UE support prn with SBA to incr. independence with dressing, grooming,etc. at home  (09-30-14)   Status Achieved   PT LONG TERM GOAL #3   Title Sit unsupported on side of mat with 1 UE support and reach 4" anteriorly with other UE support with min assist to increase independence with ADL's with mod  (09-30-14)   Status Achieved   PT LONG TERM GOAL #4   Title Independent in updated HEP  (09-30-14)   Status Achieved   PT LONG TERM GOAL #5   Title Pt. will transfer wheelchair to/from mat with sliding board with S only after set up for placement of board (11-04-14)   Status Achieved   Additional Long Term Goals   Additional Long Term Goals Yes   PT LONG TERM GOAL #6   Title Pt. will roll from supine to R and L modified independently on mat and will report ability to do so at home  (11-04-14)   Status Achieved   PT LONG TERM GOAL #7   Title Pt will transfer wheelchair to regular bed with sliding board with SBA per sister's report.  03-24-15   Time 4   Period Weeks   Status New   PT LONG TERM GOAL #8   Title Pt will report ability to roll supine to R and L sides in bed at home to avoid sleeping on back all night for reduced risk of skin breakdown.  03-24-15   Time 4   Period Weeks   Status New   PT LONG TERM GOAL  #9   TITLE Independent in updated HEP as appropriate.  03-24-15   Time 4   Period Weeks   Status New               Plan - 03/22/15 2123    Clinical Impression Statement Pt improving with rolling from supine to R and L sides; needs mod asist with sidelying to sitting on side of mat   Pt will benefit from skilled therapeutic intervention in order to improve on  the following deficits Decreased mobility;Obesity;Decreased  range of motion;Impaired flexibility;Increased muscle spasms;Decreased balance;Decreased strength   Rehab Potential Good   PT Frequency 2x / week   PT Duration 4 weeks   PT Treatment/Interventions ADLs/Self Care Home Management;Therapeutic activities;Patient/family education;Therapeutic exercise;Wheelchair mobility training;Balance training;Neuromuscular re-education;Functional mobility training   PT Next Visit Plan cont ther ex and mobility training   Consulted and Agree with Plan of Care Patient        Problem List Patient Active Problem List   Diagnosis Date Noted  . 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 Dysuria 09/04/2014  . Hypertension 05/01/2014  . Ingrown right big toenail 05/01/2014  . Hypokalemia 05/01/2014  . GERD (gastroesophageal reflux disease) 02/14/2014  . Dysphagia, pharyngoesophageal phase 02/06/2014  . Type 2 diabetes mellitus without complication ([place]) 29/52/8413  . Constipation   . Neurogenic bowel   . 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 Metastatic cancer to spine ([place]) 01/19/2014  . Rotator cuff tendonitis   . Acute postoperative respiratory failure ([place]) 01/09/2014  . 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 Leg weakness, bilateral 12/13/2013    Alda Lea, PT 03/22/2015, 9:30 PM  [facility] [address] [place], [place], [address] Phone: [phone]   Fax:  [phone]  Name: Sarah [name] MRN: [id number] Date of Birth: Jul 04, 1965

## 2015-03-27 ENCOUNTER — Encounter: Payer: Self-pay | Admitting: Physical Medicine & Rehabilitation

## 2015-03-27 ENCOUNTER — Encounter
Payer: BLUE CROSS/BLUE SHIELD | Attending: Physical Medicine & Rehabilitation | Admitting: Physical Medicine & Rehabilitation

## 2015-03-27 VITALS — BP 97/58 | HR 78 | Resp 14

## 2015-03-27 DIAGNOSIS — M5030 Other cervical disc degeneration, unspecified cervical region: Secondary | ICD-10-CM | POA: Insufficient documentation

## 2015-03-27 DIAGNOSIS — K592 Neurogenic bowel, not elsewhere classified: Secondary | ICD-10-CM

## 2015-03-27 DIAGNOSIS — R29898 Other symptoms and signs involving the musculoskeletal system: Secondary | ICD-10-CM

## 2015-03-27 DIAGNOSIS — G8220 Paraplegia, unspecified: Secondary | ICD-10-CM

## 2015-03-27 DIAGNOSIS — N319 Neuromuscular dysfunction of bladder, unspecified: Secondary | ICD-10-CM | POA: Diagnosis not present

## 2015-03-27 DIAGNOSIS — I10 Essential (primary) hypertension: Secondary | ICD-10-CM | POA: Diagnosis not present

## 2015-03-27 DIAGNOSIS — D4989 Neoplasm of unspecified behavior of other specified sites: Secondary | ICD-10-CM | POA: Insufficient documentation

## 2015-03-27 DIAGNOSIS — C73 Malignant neoplasm of thyroid gland: Secondary | ICD-10-CM

## 2015-03-27 DIAGNOSIS — G839 Paralytic syndrome, unspecified: Secondary | ICD-10-CM

## 2015-03-27 DIAGNOSIS — M1990 Unspecified osteoarthritis, unspecified site: Secondary | ICD-10-CM | POA: Diagnosis not present

## 2015-03-27 DIAGNOSIS — M779 Enthesopathy, unspecified: Secondary | ICD-10-CM | POA: Diagnosis not present

## 2015-03-27 DIAGNOSIS — E669 Obesity, unspecified: Secondary | ICD-10-CM | POA: Diagnosis not present

## 2015-03-27 DIAGNOSIS — R1314 Dysphagia, pharyngoesophageal phase: Secondary | ICD-10-CM

## 2015-03-27 DIAGNOSIS — Z85850 Personal history of malignant neoplasm of thyroid: Secondary | ICD-10-CM | POA: Insufficient documentation

## 2015-03-27 DIAGNOSIS — IMO0002 Reserved for concepts with insufficient information to code with codable children: Secondary | ICD-10-CM

## 2015-03-27 MED ORDER — BACLOFEN 20 MG PO TABS: 20.0000 mg | ORAL_TABLET | Freq: Three times a day (TID) | ORAL | Status: DC

## 2015-03-27 NOTE — Progress Notes (Signed)
Subjective:    Patient ID: Sarah [name], female    DOB: [birth year], 49 y.o.   MRN: [id number]  HPI   Sarah [name] is here in follow up of her thoracic spinal cord compression. She has not received her manual w/c yet. (nobody has called her yet).  She is doing occasional standing at home. She is working on [place] in PT at neuro-rehab also.   She is on a voiding schedule, using the BSC. She is working with PT still at neuro-rehab.     Pain Inventory Average Pain 2 Pain Right Now 1 My pain is tingling and aching  In the last 24 hours, has pain interfered with the following? General activity NA Relation with others NA Enjoyment of life NA What TIME of day is your pain at its worst? night Sleep (in general) Good  Pain is worse with: sitting and inactivity Pain improves with: NA Relief from Meds: NA  Mobility walk without assistance walk with assistance use a cane use a walker ability to climb steps?  no do you drive?  no use a wheelchair needs help with transfers Do you have any goals in this area?  yes  Function disabled: date disabled NA I need assistance with the following:  dressing, bathing, toileting and shopping Do you have any goals in this area?  yes  Neuro/Psych tingling spasms  Prior Studies PET scan  Physicians involved in your care Primary care Dr. Eliezer Mccoy   Family History  Problem Relation Age of Onset  . Hypertension Mother   . Diabetes Mother   . Hypertension Father   . Stroke Father    Social History   Social History  . Marital Status: Single    Spouse Name: N/A  . Number of Children: N/A  . Years of Education: N/A   Social History Main Topics  . Smoking status: Never Smoker   . Smokeless tobacco: Never Used  . Alcohol Use: No  . Drug Use: No  . Sexual Activity: Not Currently   Other Topics Concern  . None   Social History Narrative   Past Surgical History  Procedure Laterality Date  . Tonsillectomy    .  Laminectomy N/A 12/14/2013    Procedure: THORACIC LAMINECTOMY FOR TUMOR THORACIC THREE;  Surgeon: Ashok Pall, MD;  Location: [facility]URO ORS;  Service: Neurosurgery;  Laterality: N/A;  THORACIC LAMINECTOMY FOR TUMOR THORACIC THREE  . Posterior lumbar fusion 4 level N/A 12/30/2013    Procedure: Thoracic one-Thoracic five posterior thoracic fusion with pedicle screws;  Surgeon: Ashok Pall, MD;  Location: [facility] NEURO ORS;  Service: Neurosurgery;  Laterality: N/A;  Thoracic one-Thoracic five posterior thoracic fusion with pedicle screws  . Thyroidectomy N/A 01/09/2014    Procedure: TOTAL THYROIDECTOMY;  Surgeon: Izora Gala, MD;  Location: [facility] OR;  Service: ENT;  Laterality: N/A;   Past Medical History  Diagnosis Date  . Hypertension   . DDD (degenerative disc disease), cervical   . DJD (degenerative joint disease)   . Obesity   . Chronic back pain   . 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 History of rectal fissure   . Rotator cuff tendonitis right   BP 97/58 mmHg  Pulse 78  Resp 14  SpO2 100%  LMP 12/06/2014  Opioid Risk Score:   Fall Risk Score:  `1  Depression screen PHQ 2/9  Depression screen [facility] 2/9 12/26/2014 09/19/2014 04/20/2014 03/03/2014  Decreased Interest 0  0 0 0  Down, Depressed, Hopeless 1 1 0 1  PHQ - 2 Score 1 1 0 1  Altered sleeping - 0 - -  Tired, decreased energy - 1 - -  Change in appetite - 0 - -  Feeling bad or failure about yourself  - 0 - -  Trouble concentrating - 0 - -  Moving slowly or fidgety/restless - 0 - -  Suicidal thoughts - 0 - -  PHQ-9 Score - 2 - -     Review of Systems  Constitutional: Positive for diaphoresis.  Cardiovascular:       Limb swelling  Neurological:       Tingling spasms  All other systems reviewed and are negative.      Objective:   Physical Exam  Constitutional: She is oriented to person, place, and time. She appears well-developed and well-nourished.  HENT:  Head:  Normocephalic and atraumatic.  Neck: Normal range of motion.  Cardiovascular: Normal rate and regular rhythm.  No murmur heard.  Respiratory: Effort normal and breath sounds normal. No respiratory distress. She has no wheezes. She exhibits no tenderness.  GI: Soft. Bowel sounds are normal. She exhibits no distension. There is no tenderness.  Musculoskeletal: She exhibits no edema and no tenderness.  Neurological: She is alert and oriented to person, place, and time. sensation 1/2 below lower/mid chest wall. Able to sense pain in all 4's.   Motor strength is 5/5 bilateral deltoids, biceps, triceps, grip  2/5 bilateral hip flexors, hip add/hip abd 3/5,   3 knee extensors, 3/5 knee flexion, 3/5 ankle dorsiflexor and plantar flexors  Tone trace /4 left hip adductors, tr /4 gastrocs/hamstrings. T3-4 sensory level  Skin: toe nail stable  Psych: pleasant, in good spirits  Assessment/Plan:   1. 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  -patient is making continued progress.  -would do well with aquatic therapy.  -await for word on manual w/c  2. Pain Management/spasticity:  -tylenol prn  -maintain baclofen at '20mg'$  TID  -continue with regular stretching 4. Neurogenic bladder: timed voids q4-5 x per day   5. Neurogenic bowel/GI: no change  -suppository if needed  6. Continue with neuro-rehab: continue as she's making functional gains.  7. HTN: can stop lisinopril 8. 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 Thirty minutes of face to face patient care time were spent during this visit. All questions were encouraged and answered.

## 2015-03-27 NOTE — Patient Instructions (Signed)
CONSIDER AQUATIC BASED THERAPY TO AID IN YOUR AMBULATION EFFORTS. I CAN MAKE A REFERRAL IF NEEDED.

## 2015-03-28 ENCOUNTER — Ambulatory Visit: Payer: BLUE CROSS/BLUE SHIELD | Attending: Family Medicine | Admitting: Physical Therapy

## 2015-03-28 DIAGNOSIS — M6281 Muscle weakness (generalized): Secondary | ICD-10-CM | POA: Insufficient documentation

## 2015-03-28 DIAGNOSIS — G8220 Paraplegia, unspecified: Secondary | ICD-10-CM | POA: Insufficient documentation

## 2015-03-28 DIAGNOSIS — Z7409 Other reduced mobility: Secondary | ICD-10-CM | POA: Insufficient documentation

## 2015-03-28 DIAGNOSIS — R2991 Unspecified symptoms and signs involving the musculoskeletal system: Secondary | ICD-10-CM | POA: Insufficient documentation

## 2015-03-28 DIAGNOSIS — G839 Paralytic syndrome, unspecified: Secondary | ICD-10-CM | POA: Insufficient documentation

## 2015-03-28 NOTE — Therapy (Signed)
[facility] [address] [place], [place], [address] Phone: [phone]   Fax:  [phone]  Patient Details  Name: Sarah [name] MRN: [id number] Date of Birth: [birth year] Referring Provider:  Elberta Leatherwood, MD  Encounter Date: 03/28/2015  Pt arrived at 1055 for 1015 PT appointment - pt reports her transportation was running late which was compounded by increased traffic.  Pt requested to ride Scifit only - pt rode 15" at level 2.0 with UE's and LE's - no charge as unsupervised.  Alda Lea, PT  03/28/2015, 12:45 PM  [facility] [address] [place], [place], [address] Phone: [phone]   Fax:  [phone]

## 2015-03-29 ENCOUNTER — Ambulatory Visit: Payer: BLUE CROSS/BLUE SHIELD | Admitting: Physical Therapy

## 2015-03-29 ENCOUNTER — Other Ambulatory Visit: Payer: Self-pay | Admitting: Physical Medicine & Rehabilitation

## 2015-03-30 ENCOUNTER — Other Ambulatory Visit: Payer: Self-pay | Admitting: Physical Medicine & Rehabilitation

## 2015-04-02 ENCOUNTER — Ambulatory Visit: Payer: BLUE CROSS/BLUE SHIELD | Admitting: Physical Therapy

## 2015-04-03 ENCOUNTER — Other Ambulatory Visit: Payer: Self-pay | Admitting: *Deleted

## 2015-04-05 ENCOUNTER — Ambulatory Visit: Payer: BLUE CROSS/BLUE SHIELD | Admitting: Physical Therapy

## 2015-04-11 ENCOUNTER — Ambulatory Visit: Payer: BLUE CROSS/BLUE SHIELD | Admitting: Physical Therapy

## 2015-04-11 DIAGNOSIS — Z7409 Other reduced mobility: Secondary | ICD-10-CM | POA: Diagnosis present

## 2015-04-11 DIAGNOSIS — G8220 Paraplegia, unspecified: Secondary | ICD-10-CM

## 2015-04-11 DIAGNOSIS — R2991 Unspecified symptoms and signs involving the musculoskeletal system: Secondary | ICD-10-CM | POA: Diagnosis present

## 2015-04-11 DIAGNOSIS — M6281 Muscle weakness (generalized): Secondary | ICD-10-CM

## 2015-04-11 DIAGNOSIS — G839 Paralytic syndrome, unspecified: Secondary | ICD-10-CM | POA: Diagnosis present

## 2015-04-13 ENCOUNTER — Encounter: Payer: Self-pay | Admitting: Physical Therapy

## 2015-04-13 NOTE — Therapy (Signed)
[facility] [address] [place], [place], [address] Phone: [phone]   Fax:  [phone]  Physical Therapy Treatment  Patient Details  Name: Sarah [name] MRN: [id number] Date of Birth: 25-Jul-1965 No Data Recorded  Encounter Date: 04/11/2015      PT End of Session - 04/13/15 1119    Visit Number 32  visit 1 in 2017   Number of Visits 35   Date for PT Re-Evaluation 03/24/15   Authorization Type BCBS   Authorization Time Period 03-25-15 - 03-23-16   Authorization - Visit Number 1   Authorization - Number of Visits 30   PT Start Time 2694   PT Stop Time 1316   PT Time Calculation (min) 41 min      Past Medical History  Diagnosis Date  . Hypertension   . DDD (degenerative disc disease), cervical   . DJD (degenerative joint disease)   . Obesity   . Chronic back pain   . 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 History of rectal fissure   . Rotator cuff tendonitis right    Past Surgical History  Procedure Laterality Date  . Tonsillectomy    . Laminectomy N/A 12/14/2013    Procedure: THORACIC LAMINECTOMY FOR TUMOR THORACIC THREE;  Surgeon: Ashok Pall, MD;  Location: [place] NEURO ORS;  Service: Neurosurgery;  Laterality: N/A;  THORACIC LAMINECTOMY FOR TUMOR THORACIC THREE  . Posterior lumbar fusion 4 level N/A 12/30/2013    Procedure: Thoracic one-Thoracic five posterior thoracic fusion with pedicle screws;  Surgeon: Ashok Pall, MD;  Location: [place] NEURO ORS;  Service: Neurosurgery;  Laterality: N/A;  Thoracic one-Thoracic five posterior thoracic fusion with pedicle screws  . Thyroidectomy N/A 01/09/2014    Procedure: TOTAL THYROIDECTOMY;  Surgeon: Izora Gala, MD;  Location: [place];  Service: ENT;  Laterality: N/A;    There were no vitals filed for this visit.  Visit Diagnosis:  Muscle weakness (generalized)  Paraplegia (HCC)      Subjective Assessment - 04/13/15  1117    Subjective Pt is ready to obtain manual wheelchair - states she is now transferring independently onto bedside commode at home - reports having some difficulty pulling up pants in the back but otherwise no trouble; Josh Cadle, ATP from [facility] consulting on manual w/chair eval   Patient Stated Goals Improve mobility   Currently in Pain? No/denies       Educational psychologist; pt was evaluated for manual wheelchair - with Josh Cadle, ATP from [facility] - LMN to be completed  Recommend 20x20 [employer] ultra lightweight manual wheelchair                          PT Short Term Goals - 10/05/14 0557    PT SHORT TERM GOAL #1   Title Pt. will transfer with sliding board wheelchair to mat with +1 max assist.  (target date 08-31-14)   Status Achieved   PT SHORT TERM GOAL #2   Title Sit unsupported on side of mat x 5" with +2 mod assist   Status Achieved   PT SHORT TERM GOAL #3   Title Independent to direct HEP (stretches and PROM for bil. LE's)   Status Achieved   PT SHORT TERM GOAL #4   Title Tolerate standing in standing frame x 10" for LE stretching and weight-bearing   (08-31-14)   Status Achieved  PT SHORT TERM GOAL #5   Title Incr. endurance/activity tolerance so pt. able to perform 10' nonstop on recumbent bike Comptroller)  (08-31-14)   Status On-going           PT Long Term Goals - 02/25/15 1923    PT LONG TERM GOAL #1   Title Transfer with sliding board with +1 mod assist wheelchair to/from mat   Status Achieved   PT LONG TERM GOAL #2   Title Sit on side of mat for at least 10" with UE support prn with SBA to incr. independence with dressing, grooming,etc. at home  (09-30-14)   Status Achieved   PT LONG TERM GOAL #3   Title Sit unsupported on side of mat with 1 UE support and reach 4" anteriorly with other UE support with min assist to increase independence with ADL's with mod  (09-30-14)   Status Achieved   PT LONG TERM GOAL #4   Title Independent in  updated HEP  (09-30-14)   Status Achieved   PT LONG TERM GOAL #5   Title Pt. will transfer wheelchair to/from mat with sliding board with S only after set up for placement of board (11-04-14)   Status Achieved   Additional Long Term Goals   Additional Long Term Goals Yes   PT LONG TERM GOAL #6   Title Pt. will roll from supine to R and L modified independently on mat and will report ability to do so at home  (11-04-14)   Status Achieved   PT LONG TERM GOAL #7   Title Pt will transfer wheelchair to regular bed with sliding board with SBA per sister's report.  03-24-15   Time 4   Period Weeks   Status New   PT LONG TERM GOAL #8   Title Pt will report ability to roll supine to R and L sides in bed at home to avoid sleeping on back all night for reduced risk of skin breakdown.  03-24-15   Time 4   Period Weeks   Status New   PT LONG TERM GOAL  #9   TITLE Independent in updated HEP as appropriate.  03-24-15   Time 4   Period Weeks   Status New               Problem List Patient Active Problem List   Diagnosis Date Noted  . 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 Dysuria 09/04/2014  . Hypertension 05/01/2014  . Ingrown right big toenail 05/01/2014  . Hypokalemia 05/01/2014  . GERD (gastroesophageal reflux disease) 02/14/2014  . Dysphagia, pharyngoesophageal phase 02/06/2014  . Type 2 diabetes mellitus without complication ([place]) 78/29/5621  . Constipation   . Neurogenic bowel   . 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 ([place]) 01/20/2014  . Metastatic cancer to spine ([place]) 01/19/2014  . Rotator cuff tendonitis   . Acute postoperative respiratory failure ([place]) 01/09/2014  . 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 Leg weakness, bilateral 12/13/2013    DildayJenness Corner, PT 04/13/2015, 11:24 AM  [facility] [address] [place], [place], [address] Phone:  [phone]   Fax:  [phone]  Name: Sarah [name] MRN: [id number] Date of Birth: 10/24/1965

## 2015-04-16 ENCOUNTER — Ambulatory Visit: Payer: BLUE CROSS/BLUE SHIELD | Admitting: Physical Therapy

## 2015-04-19 ENCOUNTER — Ambulatory Visit: Payer: BLUE CROSS/BLUE SHIELD | Admitting: Physical Therapy

## 2015-04-19 DIAGNOSIS — G8220 Paraplegia, unspecified: Secondary | ICD-10-CM

## 2015-04-19 DIAGNOSIS — M6281 Muscle weakness (generalized): Secondary | ICD-10-CM | POA: Diagnosis not present

## 2015-04-19 DIAGNOSIS — Z7409 Other reduced mobility: Secondary | ICD-10-CM

## 2015-04-20 ENCOUNTER — Encounter: Payer: Self-pay | Admitting: Physical Therapy

## 2015-04-20 NOTE — Patient Instructions (Signed)
Use moist heat and then massage trigger point in R lateral low back region.

## 2015-04-20 NOTE — Therapy (Signed)
[facility] [address] [place], [place], [address] Phone: [phone]   Fax:  [phone]  Physical Therapy Treatment  Patient Details  Name: Sarah [name] MRN: [id number] Date of Birth: 1965-04-02 No Data Recorded  Encounter Date: 04/19/2015      PT End of Session - 04/20/15 1300    Visit Number 33   Number of Visits 35   Date for PT Re-Evaluation 04/24/15   Authorization Type BCBS   Authorization Time Period 03-25-15 - 03-23-16   Authorization - Visit Number 2   Authorization - Number of Visits 30   PT Start Time 1106   PT Stop Time 1155   PT Time Calculation (min) 49 min      Past Medical History  Diagnosis Date  . Hypertension   . DDD (degenerative disc disease), cervical   . DJD (degenerative joint disease)   . Obesity   . Chronic back pain   . 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 History of rectal fissure   . Rotator cuff tendonitis right    Past Surgical History  Procedure Laterality Date  . Tonsillectomy    . Laminectomy N/A 12/14/2013    Procedure: THORACIC LAMINECTOMY FOR TUMOR THORACIC THREE;  Surgeon: Ashok Pall, MD;  Location: [place] NEURO ORS;  Service: Neurosurgery;  Laterality: N/A;  THORACIC LAMINECTOMY FOR TUMOR THORACIC THREE  . Posterior lumbar fusion 4 level N/A 12/30/2013    Procedure: Thoracic one-Thoracic five posterior thoracic fusion with pedicle screws;  Surgeon: Ashok Pall, MD;  Location: [place] NEURO ORS;  Service: Neurosurgery;  Laterality: N/A;  Thoracic one-Thoracic five posterior thoracic fusion with pedicle screws  . Thyroidectomy N/A 01/09/2014    Procedure: TOTAL THYROIDECTOMY;  Surgeon: Izora Gala, MD;  Location: [place];  Service: ENT;  Laterality: N/A;    There were no vitals filed for this visit.  Visit Diagnosis:  Generalized muscle weakness  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 (HCC)  Decreased functional mobility and endurance       Subjective Assessment - 04/20/15 1252    Subjective Pt states she is having tightness/spasms in her back; continues to transfer independently on her bedside commode at home   [name] accompained by: Family member  sister [name]   Patient Stated Goals Improve mobility   Currently in Pain? No/denies                         [facility] Adult PT Treatment/Exercise - 04/20/15 0001    Bed Mobility   Bed Mobility Rolling Right;Rolling Left   Rolling Right 5: Set up   Rolling Left 5: Supervision   Supine to Sit 3: Mod assist   Transfers   Transfers Lateral/Scoot Transfers   Sit to Stand 3: Mod assist;1: +2 Total assist   Sit to Stand: Patient Percentage 60%   Stand to Sit 3: Mod assist;1: +2 Total assist   Knee/Hip Exercises: Seated   Long Arc Quad AROM;Both;1 set;10 reps   Knee/Hip Exercises: Supine   Heel Slides AAROM;10 reps;Both  10 sec hold at chest for stretching   Bridges AAROM;10 reps   Other Supine Knee/Hip Exercises trunk rotation stretch - both sides 30 sec hold   Knee/Hip Exercises: Sidelying   Hip ABduction AROM;Both;10 reps  in hooklying - manual resistance given     Leaning back on chair for trunk stretching - lifting 1/both arms up and back; 3  reps  Pt able to transfer sit to supine with verbal cues and several attempts to lift LE's up and onto mat Pt able to roll with min assist for LE positioning and to initiate roll with pelvis L sidelying to sitting - mod assist required  Pt stood from hi/lo mat table to wheelchair x 7 attempts - with standing achieved approx. 3 of those attempts - not completely  upright as pt was flexed at hips and unable to extend hips in standing             PT Short Term Goals - 10/05/14 0557    PT SHORT TERM GOAL #1   Title Pt. will transfer with sliding board wheelchair to mat with +1 max assist.  (target date 08-31-14)   Status Achieved   PT SHORT TERM GOAL #2   Title Sit unsupported on side of mat x 5" with  +2 mod assist   Status Achieved   PT SHORT TERM GOAL #3   Title Independent to direct HEP (stretches and PROM for bil. LE's)   Status Achieved   PT SHORT TERM GOAL #4   Title Tolerate standing in standing frame x 10" for LE stretching and weight-bearing   (08-31-14)   Status Achieved   PT SHORT TERM GOAL #5   Title Incr. endurance/activity tolerance so pt. able to perform 10' nonstop on recumbent bike Comptroller)  (08-31-14)   Status On-going           PT Long Term Goals - 02/25/15 1923    PT LONG TERM GOAL #1   Title Transfer with sliding board with +1 mod assist wheelchair to/from mat   Status Achieved   PT LONG TERM GOAL #2   Title Sit on side of mat for at least 10" with UE support prn with SBA to incr. independence with dressing, grooming,etc. at home  (09-30-14)   Status Achieved   PT LONG TERM GOAL #3   Title Sit unsupported on side of mat with 1 UE support and reach 4" anteriorly with other UE support with min assist to increase independence with ADL's with mod  (09-30-14)   Status Achieved   PT LONG TERM GOAL #4   Title Independent in updated HEP  (09-30-14)   Status Achieved   PT LONG TERM GOAL #5   Title Pt. will transfer wheelchair to/from mat with sliding board with S only after set up for placement of board (11-04-14)   Status Achieved   Additional Long Term Goals   Additional Long Term Goals Yes   PT LONG TERM GOAL #6   Title Pt. will roll from supine to R and L modified independently on mat and will report ability to do so at home  (11-04-14)   Status Achieved   PT LONG TERM GOAL #7   Title Pt will transfer wheelchair to regular bed with sliding board with SBA per sister's report.  03-24-15   Time 4   Period Weeks   Status New   PT LONG TERM GOAL #8   Title Pt will report ability to roll supine to R and L sides in bed at home to avoid sleeping on back all night for reduced risk of skin breakdown.  03-24-15   Time 4   Period Weeks   Status New   PT LONG TERM GOAL  #9    TITLE Independent in updated HEP as appropriate.  03-24-15   Time 4   Period Weeks   Status  New               Plan - 04/20/15 1301    Clinical Impression Statement Pt did well with sit to stand from high.low mat table to back of power wheelchair for support - required several attempts and unable to stand on some attempts but abel to extend both knees and achieve weight-bearing with mod assist   Pt will benefit from skilled therapeutic intervention in order to improve on the following deficits Decreased mobility;Obesity;Decreased range of motion;Impaired flexibility;Increased muscle spasms;Decreased balance;Decreased strength   Rehab Potential Good   PT Frequency 2x / week   PT Duration 4 weeks   PT Treatment/Interventions ADLs/Self Care Home Management;Therapeutic activities;Patient/family education;Therapeutic exercise;Wheelchair mobility training;Balance training;Neuromuscular re-education;Functional mobility training   PT Next Visit Plan cont ther ex and mobility training   Consulted and Agree with Plan of Care Patient   Family Member Consulted pt's sister-[name]        Problem List Patient Active Problem List   Diagnosis Date Noted  . 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 Dysuria 09/04/2014  . Hypertension 05/01/2014  . Ingrown right big toenail 05/01/2014  . Hypokalemia 05/01/2014  . GERD (gastroesophageal reflux disease) 02/14/2014  . Dysphagia, pharyngoesophageal phase 02/06/2014  . Type 2 diabetes mellitus without complication ([place]) 36/62/9476  . Constipation   . Neurogenic bowel   . 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 Metastatic cancer to spine ([place]) 01/19/2014  . Rotator cuff tendonitis   . Acute postoperative respiratory failure ([place]) 01/09/2014  . 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 Leg weakness, bilateral 12/13/2013    DildayJenness Corner, PT 04/20/2015, 1:05 PM  [facility] [address] [place], [place], [address] Phone: [phone]   Fax:  [phone]  Name: ALAA MULLALLY MRN: [id number] Date of Birth: [birth year]

## 2015-04-23 ENCOUNTER — Ambulatory Visit: Payer: BLUE CROSS/BLUE SHIELD | Admitting: Physical Therapy

## 2015-04-23 ENCOUNTER — Encounter: Payer: Self-pay | Admitting: Physical Therapy

## 2015-04-23 DIAGNOSIS — R29818 Other symptoms and signs involving the nervous system: Secondary | ICD-10-CM

## 2015-04-23 DIAGNOSIS — G8220 Paraplegia, unspecified: Secondary | ICD-10-CM

## 2015-04-23 DIAGNOSIS — M6281 Muscle weakness (generalized): Secondary | ICD-10-CM

## 2015-04-23 DIAGNOSIS — R2991 Unspecified symptoms and signs involving the musculoskeletal system: Secondary | ICD-10-CM

## 2015-04-23 NOTE — Therapy (Signed)
[facility] [address] [place], [place], [address] Phone: [phone]   Fax:  [phone]  Physical Therapy Treatment  Patient Details  Name: Sarah [name] MRN: [id number] Date of Birth: 07-15-1965 No Data Recorded  Encounter Date: 04/23/2015      PT End of Session - 04/23/15 1652    Visit Number 34   Number of Visits 35   Date for PT Re-Evaluation 04/24/15   Authorization Type BCBS   Authorization Time Period 03-25-15 - 03-23-16   Authorization - Visit Number 3  in 2017   Authorization - Number of Visits 30   PT Start Time 1028   PT Stop Time 1114   PT Time Calculation (min) 46 min   Equipment Utilized During Treatment Gait belt      Past Medical History  Diagnosis Date  . Hypertension   . DDD (degenerative disc disease), cervical   . DJD (degenerative joint disease)   . Obesity   . Chronic back pain   . 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 History of rectal fissure   . Rotator cuff tendonitis right    Past Surgical History  Procedure Laterality Date  . Tonsillectomy    . Laminectomy N/A 12/14/2013    Procedure: THORACIC LAMINECTOMY FOR TUMOR THORACIC THREE;  Surgeon: Ashok Pall, MD;  Location: [place] NEURO ORS;  Service: Neurosurgery;  Laterality: N/A;  THORACIC LAMINECTOMY FOR TUMOR THORACIC THREE  . Posterior lumbar fusion 4 level N/A 12/30/2013    Procedure: Thoracic one-Thoracic five posterior thoracic fusion with pedicle screws;  Surgeon: Ashok Pall, MD;  Location: [place] NEURO ORS;  Service: Neurosurgery;  Laterality: N/A;  Thoracic one-Thoracic five posterior thoracic fusion with pedicle screws  . Thyroidectomy N/A 01/09/2014    Procedure: TOTAL THYROIDECTOMY;  Surgeon: Izora Gala, MD;  Location: [place];  Service: ENT;  Laterality: N/A;    There were no vitals filed for this visit.  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 No/denies                         [facility] Adult PT Treatment/Exercise - 04/23/15 1044    Bed Mobility   Bed Mobility Rolling Right;Rolling Left   Rolling Right 5: Set up   Rolling Left 5: Supervision   Supine to Sit 3: Mod assist   Sit to Supine 5: Supervision   Transfers   Transfers Lateral/Scoot Transfers   Sit to Stand 3: Mod assist;1: +2 Total assist   Sit to Stand: Patient Percentage 70%   Stand to Sit 3: Mod assist;1: +2 Total assist      Pt stood from wheelchair to side of parallel bar with +2 mod assist - approx. 30 secs 1st rep, approx. 15 secs 2nd rep and approx. 8 secs 3rd rep  Pt rolled supine to prone with mod to max assist to position RUE (shoulder) comfortably; pt able to get in quadriped position with +3 max assist,  then in tall kneeling with use of chair in front for UE  support - pt able to maintain tall kneeling approx. 45 secs - needed cues to stand erect and keep Hips tucked for incr. Hip extension  Pt rode Sci[employer] level 2.0 x 10" with UE's and LE's            PT Short Term Goals - 10/05/14 0557    PT SHORT TERM GOAL #1   Title Pt. will transfer with sliding board wheelchair to mat with +1 max assist.  (target date 08-31-14)   Status Achieved   PT SHORT TERM GOAL #2   Title Sit unsupported on side of mat x 5" with +2 mod assist   Status Achieved   PT SHORT TERM GOAL #3   Title Independent to direct HEP (stretches and PROM for bil. LE's)   Status Achieved   PT SHORT TERM GOAL #4   Title Tolerate standing in standing frame x 10" for LE stretching and weight-bearing   (08-31-14)   Status Achieved   PT SHORT TERM GOAL #5   Title Incr.  endurance/activity tolerance so pt. able to perform 10' nonstop on recumbent bike Comptroller)  (08-31-14)   Status On-going           PT Long Term Goals - 02/25/15 1923    PT LONG TERM GOAL #1   Title Transfer with sliding board with +1 mod assist wheelchair to/from mat   Status Achieved   PT LONG TERM GOAL #2   Title Sit on side of mat for at least 10" with UE support prn with SBA to incr. independence with dressing, grooming,etc. at home  (09-30-14)   Status Achieved   PT LONG TERM GOAL #3   Title Sit unsupported on side of mat with 1 UE support and reach 4" anteriorly with other UE support with min assist to increase independence with ADL's with mod  (09-30-14)   Status Achieved   PT LONG TERM GOAL #4   Title Independent in updated HEP  (09-30-14)   Status Achieved   PT LONG TERM GOAL #5   Title Pt. will transfer wheelchair to/from mat with sliding board with S only after set up for placement of board (11-04-14)   Status Achieved   Additional Long Term Goals   Additional Long Term Goals Yes   PT LONG TERM GOAL #6   Title Pt. will roll from supine to R and L modified independently on mat and will report ability to do so at home  (11-04-14)   Status Achieved   PT LONG TERM GOAL #7   Title Pt will transfer wheelchair to regular bed with sliding board with SBA per sister's report.  03-24-15   Time 4   Period Weeks   Status New   PT LONG TERM GOAL #8   Title Pt will report ability to roll supine to R and L sides in bed at home to avoid sleeping on back all night for reduced risk of skin breakdown.  03-24-15   Time 4   Period Weeks   Status New   PT LONG TERM GOAL  #9   TITLE Independent in updated HEP as appropriate.  03-24-15   Time 4   Period Weeks   Status New               Plan - 04/23/15 1654    Clinical Impression Statement Pt did much better with sit to stand transfers from wheelchair to side of parallel bar - +2 mod assist for safety and due to  pt's fear of falling -  pt required mod assist for initial sit to stand but able to actively extend knees in standing position   Pt will benefit from skilled therapeutic intervention in order to improve on the following deficits Decreased mobility;Obesity;Decreased range of motion;Impaired flexibility;Increased muscle spasms;Decreased balance;Decreased strength   Rehab Potential Good   PT Frequency 2x / week   PT Duration 4 weeks   PT Treatment/Interventions ADLs/Self Care Home Management;Therapeutic activities;Patient/family education;Therapeutic exercise;Wheelchair mobility training;Balance training;Neuromuscular re-education;Functional mobility training   PT Next Visit Plan cont ther ex and mobility training; check LTG's and renew   PT Home Exercise Plan added yellow theraband for LAQ's while seated in wheelchair   Consulted and Agree with Plan of Care Patient        Problem List Patient Active Problem List   Diagnosis Date Noted  . 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 Dysuria 09/04/2014  . Hypertension 05/01/2014  . Ingrown right big toenail 05/01/2014  . Hypokalemia 05/01/2014  . GERD (gastroesophageal reflux disease) 02/14/2014  . Dysphagia, pharyngoesophageal phase 02/06/2014  . Type 2 diabetes mellitus without complication ([place]) 79/43/2761  . Constipation   . Neurogenic bowel   . 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 Metastatic cancer to spine ([place]) 01/19/2014  . Rotator cuff tendonitis   . Acute postoperative respiratory failure ([place]) 01/09/2014  . 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 Leg weakness, bilateral 12/13/2013    DildayJenness Corner, PT 04/23/2015, 4:58 PM  [facility] [address] [place], [place], [address] Phone: [phone]   Fax:  [phone]  Name: Sarah [name] MRN: [id number] Date of Birth: Nov 30, 1965

## 2015-04-26 ENCOUNTER — Ambulatory Visit: Payer: BLUE CROSS/BLUE SHIELD | Attending: Family Medicine | Admitting: Physical Therapy

## 2015-04-26 DIAGNOSIS — R262 Difficulty in walking, not elsewhere classified: Secondary | ICD-10-CM | POA: Insufficient documentation

## 2015-04-26 DIAGNOSIS — G839 Paralytic syndrome, unspecified: Secondary | ICD-10-CM | POA: Diagnosis present

## 2015-04-26 DIAGNOSIS — M6281 Muscle weakness (generalized): Secondary | ICD-10-CM | POA: Diagnosis not present

## 2015-04-26 DIAGNOSIS — Z7409 Other reduced mobility: Secondary | ICD-10-CM | POA: Insufficient documentation

## 2015-04-27 NOTE — Therapy (Signed)
[facility] [address] [place], [place], [address] Phone: [phone]   Fax:  [phone]  Physical Therapy Treatment  Patient Details  Name: Sarah [name] MRN: [id number] Date of Birth: August 26, 1965 No Data Recorded  Encounter Date: 04/26/2015      PT End of Session - 04/27/15 1231    Visit Number 35  4 visits used in 2017   Number of Visits 35   Date for PT Re-Evaluation 04/24/15   Authorization Type BCBS   Authorization Time Period 03-25-15 - 03-23-16   Authorization - Visit Number 4  in 2017   Authorization - Number of Visits 30   PT Start Time 1108   PT Stop Time 1155   PT Time Calculation (min) 47 min   Equipment Utilized During Treatment Gait belt      Past Medical History  Diagnosis Date  . Hypertension   . DDD (degenerative disc disease), cervical   . DJD (degenerative joint disease)   . Obesity   . Chronic back pain   . 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 History of rectal fissure   . Rotator cuff tendonitis right    Past Surgical History  Procedure Laterality Date  . Tonsillectomy    . Laminectomy N/A 12/14/2013    Procedure: THORACIC LAMINECTOMY FOR TUMOR THORACIC THREE;  Surgeon: Ashok Pall, MD;  Location: [place] [facility];  Service: Neurosurgery;  Laterality: N/A;  THORACIC LAMINECTOMY FOR TUMOR THORACIC THREE  . Posterior lumbar fusion 4 level N/A 12/30/2013    Procedure: Thoracic one-Thoracic five posterior thoracic fusion with pedicle screws;  Surgeon: Ashok Pall, MD;  Location: [place] NEURO ORS;  Service: Neurosurgery;  Laterality: N/A;  Thoracic one-Thoracic five posterior thoracic fusion with pedicle screws  . Thyroidectomy N/A 01/09/2014    Procedure: TOTAL THYROIDECTOMY;  Surgeon: Izora Gala, MD;  Location: [facility];  Service: ENT;  Laterality: N/A;    There were no vitals filed for this visit.  Visit Diagnosis:  Generalized muscle  weakness  Decreased functional mobility and endurance      Subjective Assessment - 04/27/15 1227    Subjective Pt states trigger point in back is occurring again - states she used dumbbells for UE strengthening and thinks that use of these weights may be causing it   [name] accompained by: Family member   Patient Stated Goals Improve mobility   Currently in Pain? No/denies                         [facility] Adult PT Treatment/Exercise - 04/27/15 0001    Bed Mobility   Bed Mobility Rolling Right;Rolling Left   Rolling Right 5: Set up   Rolling Left 5: Supervision   Supine to Sit 3: Mod assist   Sit to Supine 5: Supervision   Transfers   Transfers Lateral/Scoot Transfers   Sit to Stand 3: Mod assist;1: +2 Total assist   Sit to Stand: Patient Percentage 70%   Stand to Sit 3: Mod assist;1: +2 Total assist   Lateral/Scoot Transfers 4: Min guard   Transfers Lateral/Scoot Transfers   Knee/Hip Exercises: Aerobic   Stationary Bike SciFit level 1.5 x 5" with UE and LE's   Knee/Hip Exercises: Seated   Long Arc Quad AROM;Both;1 set;10 reps   Knee/Hip Exercises: Supine   Other Supine Knee/Hip Exercises trunk rotation stretch - both sides 30 sec hold  Knee/Hip Exercises: Sidelying   Hip ABduction AROM;Both;10 reps  in hooklying - manual resistance given   Knee/Hip Exercises: Prone   Hamstring Curl 1 set;10 reps  RLE and LLE with mod assist     Pt rolled supine to R sidelying to prone with mod assist due to discomfort of RUE and pt needed assistance in repositioning RUE Prone on forearms - 2 reps for 10 sec hold for stretching  Knee flexion listed above with mod assist  Pt stood from wheelchair to outside of parallel bar - 1 rep for 1" 1st rep - pt reported fatigue after this activity  Bridging x 10 reps - 2 sets           PT Short Term Goals - 10/05/14 0557    PT SHORT TERM GOAL #1   Title Pt. will transfer with sliding board wheelchair to mat with +1 max  assist.  (target date 08-31-14)   Status Achieved   PT SHORT TERM GOAL #2   Title Sit unsupported on side of mat x 5" with +2 mod assist   Status Achieved   PT SHORT TERM GOAL #3   Title Independent to direct HEP (stretches and PROM for bil. LE's)   Status Achieved   PT SHORT TERM GOAL #4   Title Tolerate standing in standing frame x 10" for LE stretching and weight-bearing   (08-31-14)   Status Achieved   PT SHORT TERM GOAL #5   Title Incr. endurance/activity tolerance so pt. able to perform 10' nonstop on recumbent bike Comptroller)  (08-31-14)   Status On-going           PT Long Term Goals - 02/25/15 1923    PT LONG TERM GOAL #1   Title Transfer with sliding board with +1 mod assist wheelchair to/from mat   Status Achieved   PT LONG TERM GOAL #2   Title Sit on side of mat for at least 10" with UE support prn with SBA to incr. independence with dressing, grooming,etc. at home  (09-30-14)   Status Achieved   PT LONG TERM GOAL #3   Title Sit unsupported on side of mat with 1 UE support and reach 4" anteriorly with other UE support with min assist to increase independence with ADL's with mod  (09-30-14)   Status Achieved   PT LONG TERM GOAL #4   Title Independent in updated HEP  (09-30-14)   Status Achieved   PT LONG TERM GOAL #5   Title Pt. will transfer wheelchair to/from mat with sliding board with S only after set up for placement of board (11-04-14)   Status Achieved   Additional Long Term Goals   Additional Long Term Goals Yes   PT LONG TERM GOAL #6   Title Pt. will roll from supine to R and L modified independently on mat and will report ability to do so at home  (11-04-14)   Status Achieved   PT LONG TERM GOAL #7   Title Pt will transfer wheelchair to regular bed with sliding board with SBA per sister's report.  03-24-15   Time 4   Period Weeks   Status New   PT LONG TERM GOAL #8   Title Pt will report ability to roll supine to R and L sides in bed at home to avoid sleeping on  back all night for reduced risk of skin breakdown.  03-24-15   Time 4   Period Weeks   Status New   PT  LONG TERM GOAL  #9   TITLE Independent in updated HEP as appropriate.  03-24-15   Time 4   Period Weeks   Status New               Plan - 04/27/15 1232    Clinical Impression Statement Pt continues to improve with standing and mobility; sit to supine transfers require incr. time due to spasticity in LE's and in back which impede ease of movement   Pt will benefit from skilled therapeutic intervention in order to improve on the following deficits Decreased mobility;Obesity;Decreased range of motion;Impaired flexibility;Increased muscle spasms;Decreased balance;Decreased strength   Rehab Potential Good   PT Frequency 2x / week   PT Duration 4 weeks   PT Treatment/Interventions ADLs/Self Care Home Management;Therapeutic activities;Patient/family education;Therapeutic exercise;Wheelchair mobility training;Balance training;Neuromuscular re-education;Functional mobility training   PT Next Visit Plan cont ther ex and mobility training; check LTG's and renew   PT Home Exercise Plan given green theraband for PRE's with HEP   Consulted and Agree with Plan of Care Patient        Problem List Patient Active Problem List   Diagnosis Date Noted  . 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 Dysuria 09/04/2014  . Hypertension 05/01/2014  . Ingrown right big toenail 05/01/2014  . Hypokalemia 05/01/2014  . GERD (gastroesophageal reflux disease) 02/14/2014  . Dysphagia, pharyngoesophageal phase 02/06/2014  . Type 2 diabetes mellitus without complication ([place]) 55/73/2202  . Constipation   . Neurogenic bowel   . 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 ([place]) 01/20/2014  . Metastatic cancer to spine ([place]) 01/19/2014  . Rotator cuff tendonitis   . Acute postoperative respiratory failure ([place]) 01/09/2014  . 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  Leg weakness, bilateral 12/13/2013    RKYHCW, CBJSE [id number], PT 04/27/2015, 12:36 PM  [facility] [address] [place], [place], [address] Phone: [phone]   Fax:  [phone]  Name: CHANNAH GODEAUX MRN: [id number] Date of Birth: [birth year]

## 2015-04-30 ENCOUNTER — Ambulatory Visit: Payer: BLUE CROSS/BLUE SHIELD | Admitting: Physical Therapy

## 2015-04-30 DIAGNOSIS — R262 Difficulty in walking, not elsewhere classified: Secondary | ICD-10-CM

## 2015-04-30 DIAGNOSIS — Z7409 Other reduced mobility: Secondary | ICD-10-CM

## 2015-04-30 DIAGNOSIS — M6281 Muscle weakness (generalized): Secondary | ICD-10-CM | POA: Diagnosis not present

## 2015-05-02 NOTE — Therapy (Signed)
[facility] [address] [place], [place], [address] Phone: [phone]   Fax:  [phone]  Physical Therapy Treatment  Patient Details  Name: Sarah [name] MRN: [id number] Date of Birth: [birth year] No Data Recorded  Encounter Date: 04/30/2015      PT End of Session - 05/02/15 1622    Visit Number 36  5 visits used in 2017   Number of Visits 35   Date for PT Re-Evaluation 05/09/15   Authorization Type BCBS   Authorization Time Period 03-25-15 - 03-23-16   Authorization - Visit Number 5   Authorization - Number of Visits 30   PT Start Time 1017   PT Stop Time 1101   PT Time Calculation (min) 44 min      Past Medical History  Diagnosis Date  . Hypertension   . DDD (degenerative disc disease), cervical   . DJD (degenerative joint disease)   . Obesity   . Chronic back pain   . 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 History of rectal fissure   . Rotator cuff tendonitis right    Past Surgical History  Procedure Laterality Date  . Tonsillectomy    . Laminectomy N/A 12/14/2013    Procedure: THORACIC LAMINECTOMY FOR TUMOR THORACIC THREE;  Surgeon: Ashok Pall, MD;  Location: [place] NEUR[facility]S;  Service: Neurosurgery;  Laterality: N/A;  THORACIC LAMINECTOMY FOR TUMOR THORACIC THREE  . Posterior lumbar fusion 4 level N/A 12/30/2013    Procedure: Thoracic one-Thoracic five posterior thoracic fusion with pedicle screws;  Surgeon: Ashok Pall, MD;  Location: [facility]UR[facility]S;  Service: Neurosurgery;  Laterality: N/A;  Thoracic one-Thoracic five posterior thoracic fusion with pedicle screws  . Thyroidectomy N/A 01/09/2014    Procedure: TOTAL THYROIDECTOMY;  Surgeon: Izora Gala, MD;  Location: [facility];  Service: ENT;  Laterality: N/A;    There were no vitals filed for this visit.  Visit Diagnosis:  Generalized muscle weakness  Decreased functional mobility and endurance  Inability  to walk      Subjective Assessment - 05/02/15 1622    Subjective Pt reports no changes since session last week   Patient Stated Goals Improve mobility   Currently in Pain? No/denies      Gait; pt was hooked up into unweighting system with use of vest and leg loops to keep vest lowered to prevent from  riding up onto chest Pt gait trained in system approx. 35' x 1, then with seated rest period another 81' x 1 rep with +3 assist on 2nd rep to  assist in hip extension and upright trunk control; pt needed cues to stand erect and bring chest and hips forward for incr.  Hip extension                             PT Short Term Goals - 10/05/14 0557    PT SHORT TERM GOAL #1   Title Pt. will transfer with sliding board wheelchair to mat with +1 max assist.  (target date 08-31-14)   Status Achieved   PT SHORT TERM GOAL #2   Title Sit unsupported on side of mat x 5" with +2 mod assist   Status Achieved   PT SHORT TERM GOAL #3   Title Independent to direct HEP (stretches and PROM for bil. LE's)   Status Achieved   PT SHORT TERM GOAL #4  Title Tolerate standing in standing frame x 10" for LE stretching and weight-bearing   (08-31-14)   Status Achieved   PT SHORT TERM GOAL #5   Title Incr. endurance/activity tolerance so pt. able to perform 10' nonstop on recumbent bike Comptroller)  (08-31-14)   Status On-going           PT Long Term Goals - 02/25/15 1923    PT LONG TERM GOAL #1   Title Transfer with sliding board with +1 mod assist wheelchair to/from mat   Status Achieved   PT LONG TERM GOAL #2   Title Sit on side of mat for at least 10" with UE support prn with SBA to incr. independence with dressing, grooming,etc. at home  (09-30-14)   Status Achieved   PT LONG TERM GOAL #3   Title Sit unsupported on side of mat with 1 UE support and reach 4" anteriorly with other UE support with min assist to increase independence with ADL's with mod  (09-30-14)   Status Achieved    PT LONG TERM GOAL #4   Title Independent in updated HEP  (09-30-14)   Status Achieved   PT LONG TERM GOAL #5   Title Pt. will transfer wheelchair to/from mat with sliding board with S only after set up for placement of board (11-04-14)   Status Achieved   Additional Long Term Goals   Additional Long Term Goals Yes   PT LONG TERM GOAL #6   Title Pt. will roll from supine to R and L modified independently on mat and will report ability to do so at home  (11-04-14)   Status Achieved   PT LONG TERM GOAL #7   Title Pt will transfer wheelchair to regular bed with sliding board with SBA per sister's report.  03-24-15   Time 4   Period Weeks   Status New   PT LONG TERM GOAL #8   Title Pt will report ability to roll supine to R and L sides in bed at home to avoid sleeping on back all night for reduced risk of skin breakdown.  03-24-15   Time 4   Period Weeks   Status New   PT LONG TERM GOAL  #9   TITLE Independent in updated HEP as appropriate.  03-24-15   Time 4   Period Weeks   Status New               Plan - 05/02/15 1624    Clinical Impression Statement Pt amb. for first time in unweighting system today - reported much fatigue at end of 2nd rep   Pt will benefit from skilled therapeutic intervention in order to improve on the following deficits Decreased mobility;Obesity;Decreased range of motion;Impaired flexibility;Increased muscle spasms;Decreased balance;Decreased strength   Rehab Potential Good   PT Frequency 2x / week   PT Duration 4 weeks   PT Treatment/Interventions ADLs/Self Care Home Management;Therapeutic activities;Patient/family education;Therapeutic exercise;Wheelchair mobility training;Balance training;Neuromuscular re-education;Functional mobility training   PT Next Visit Plan cont ther ex and mobility training; check LTG's and renew   PT Home Exercise Plan given green theraband for PRE's with HEP   Consulted and Agree with Plan of Care Patient        Problem  List Patient Active Problem List   Diagnosis Date Noted  . 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 Dysuria 09/04/2014  . Hypertension 05/01/2014  . Ingrown right big toenail 05/01/2014  . Hypokalemia 05/01/2014  . GERD (gastroesophageal reflux disease) 02/14/2014  .  Dysphagia, pharyngoesophageal phase 02/06/2014  . Type 2 diabetes mellitus without complication ([place]) 26/71/2458  . Constipation   . Neurogenic bowel   . 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 Metastatic cancer to spine ([place]) 01/19/2014  . Rotator cuff tendonitis   . Acute postoperative respiratory failure ([place]) 01/09/2014  . 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 Leg weakness, bilateral 12/13/2013    KDXIPJ, ASNKN [id number], PT 05/02/2015, 4:27 PM  [facility] [address] [place], [place], [address] Phone: [phone]   Fax:  [phone]  Name: Sarah [name] MRN: [id number] Date of Birth: Jun 07, 1965

## 2015-05-07 ENCOUNTER — Ambulatory Visit: Payer: BLUE CROSS/BLUE SHIELD | Admitting: Physical Medicine & Rehabilitation

## 2015-05-08 ENCOUNTER — Ambulatory Visit: Payer: BLUE CROSS/BLUE SHIELD | Admitting: Physical Therapy

## 2015-05-08 DIAGNOSIS — Z7409 Other reduced mobility: Secondary | ICD-10-CM

## 2015-05-08 DIAGNOSIS — R262 Difficulty in walking, not elsewhere classified: Secondary | ICD-10-CM

## 2015-05-08 DIAGNOSIS — M6281 Muscle weakness (generalized): Secondary | ICD-10-CM

## 2015-05-09 NOTE — Therapy (Signed)
[facility] [address] [place], [place], [address] Phone: [phone]   Fax:  [phone]  Physical Therapy Treatment  Patient Details  Name: Sarah [name] MRN: [id number] Date of Birth: 1965-07-27 No Data Recorded  Encounter Date: 05/08/2015      PT End of Session - 05/09/15 1615    Visit Number 37   Number of Visits 40   Date for PT Re-Evaluation 05/16/15   Authorization Type BCBS   Authorization Time Period 03-25-15 - 03-23-16   Authorization - Visit Number 6   Authorization - Number of Visits 30   PT Start Time 9563   PT Stop Time 8756   PT Time Calculation (min) 54 min      Past Medical History  Diagnosis Date  . Hypertension   . DDD (degenerative disc disease), cervical   . DJD (degenerative joint disease)   . Obesity   . Chronic back pain   . 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 History of rectal fissure   . Rotator cuff tendonitis right    Past Surgical History  Procedure Laterality Date  . Tonsillectomy    . Laminectomy N/A 12/14/2013    Procedure: THORACIC LAMINECTOMY FOR TUMOR THORACIC THREE;  Surgeon: Ashok Pall, MD;  Location: [place] NEUR[facility]S;  Service: Neurosurgery;  Laterality: N/A;  THORACIC LAMINECTOMY FOR TUMOR THORACIC THREE  . Posterior lumbar fusion 4 level N/A 12/30/2013    Procedure: Thoracic one-Thoracic five posterior thoracic fusion with pedicle screws;  Surgeon: Ashok Pall, MD;  Location: [place] NEURO [facility]S;  Service: Neurosurgery;  Laterality: N/A;  Thoracic one-Thoracic five posterior thoracic fusion with pedicle screws  . Thyroidectomy N/A 01/09/2014    Procedure: TOTAL THYROIDECTOMY;  Surgeon: Izora Gala, MD;  Location: [facility];  Service: ENT;  Laterality: N/A;    There were no vitals filed for this visit.  Visit Diagnosis:  Muscle weakness (generalized)  Inability to walk  Decreased functional mobility and endurance       Subjective Assessment - 05/09/15 1613    Subjective Pt reports she has been trying to do more stretches at home; is still in hospital bed but has been transferrring to [facility] by herself   [name] accompained by: Family member   Patient Stated Goals Improve mobility   Currently in Pain? No/denies       Gait; pt gait trained with unweighting system and harness - +2 assist - approx. 15' x 1 1st rep; difficulty advancing LE's in swing phase - blue shoe  Covers placed on shoes which helped to increase ease with swing through; approx. 20'x 1 and approx. 30' x 1 on final rep  Pt stood using bars on unweighting system - +1 mod assist with verbal and tactile cues for hip and knee extension; pt stood approx. 1" x 1 rep  and approx. 2" on 2nd rep  Discussed need to try to get in regular bed and practice rolling and rolling prone; also emphasized need for continued stretching for back and LE's                        PT Education - 05/09/15 1614    Education provided Yes   Education Details instructed pt to do stretches in bed and try rollling prone   Person(s) Educated Patient;Other (comment)  sister   Methods Explanation   Comprehension Verbalized understanding  PT Short Term Goals - 10/05/14 0557    PT SHORT TERM GOAL #1   Title Pt. will transfer with sliding board wheelchair to mat with +1 max assist.  (target date 08-31-14)   Status Achieved   PT SHORT TERM GOAL #2   Title Sit unsupported on side of mat x 5" with +2 mod assist   Status Achieved   PT SHORT TERM GOAL #3   Title Independent to direct HEP (stretches and PROM for bil. LE's)   Status Achieved   PT SHORT TERM GOAL #4   Title Tolerate standing in standing frame x 10" for LE stretching and weight-bearing   (08-31-14)   Status Achieved   PT SHORT TERM GOAL #5   Title Incr. endurance/activity tolerance so pt. able to perform 10' nonstop on recumbent bike Comptroller)  (08-31-14)   Status On-going            PT Long Term Goals - 02/25/15 1923    PT LONG TERM GOAL #1   Title Transfer with sliding board with +1 mod assist wheelchair to/from mat   Status Achieved   PT LONG TERM GOAL #2   Title Sit on side of mat for at least 10" with UE support prn with SBA to incr. independence with dressing, grooming,etc. at home  (09-30-14)   Status Achieved   PT LONG TERM GOAL #3   Title Sit unsupported on side of mat with 1 UE support and reach 4" anteriorly with other UE support with min assist to increase independence with ADL's with mod  (09-30-14)   Status Achieved   PT LONG TERM GOAL #4   Title Independent in updated HEP  (09-30-14)   Status Achieved   PT LONG TERM GOAL #5   Title Pt. will transfer wheelchair to/from mat with sliding board with S only after set up for placement of board (11-04-14)   Status Achieved   Additional Long Term Goals   Additional Long Term Goals Yes   PT LONG TERM GOAL #6   Title Pt. will roll from supine to R and L modified independently on mat and will report ability to do so at home  (11-04-14)   Status Achieved   PT LONG TERM GOAL #7   Title Pt will transfer wheelchair to regular bed with sliding board with SBA per sister's report.  03-24-15   Time 4   Period Weeks   Status New   PT LONG TERM GOAL #8   Title Pt will report ability to roll supine to R and L sides in bed at home to avoid sleeping on back all night for reduced risk of skin breakdown.  03-24-15   Time 4   Period Weeks   Status New   PT LONG TERM GOAL  #9   TITLE Independent in updated HEP as appropriate.  03-24-15   Time 4   Period Weeks   Status New               Plan - 05/09/15 1619    Clinical Impression Statement Pt had incr. spasticity in RLE in standing and walking today - stated that the shoes she was wearing today were heavier than ones she wore last week and made it more difficult to advance in swing; blue shoe covers helped to increase ease with advancement  Pt will benefit from skilled therapeutic intervention in order to improve on the following deficits Decreased mobility;Obesity;Decreased range of motion;Impaired flexibility;Increased muscle spasms;Decreased balance;Decreased strength   Rehab Potential Good   PT Frequency 2x / week   PT Duration 4 weeks   PT Treatment/Interventions ADLs/Self Care Home Management;Therapeutic activities;Patient/family education;Therapeutic exercise;Wheelchair mobility training;Balance training;Neuromuscular re-education;Functional mobility training   PT Next Visit Plan cont ther ex and mobility training; check LTG's and renew   PT Home Exercise Plan given green theraband for PRE's with HEP   Consulted and Agree with Plan of Care Patient   Family Member Consulted pt's sister-[name]        Problem List Patient Active Problem List   Diagnosis Date Noted  . 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 Dysuria 09/04/2014  . Hypertension 05/01/2014  . Ingrown right big toenail 05/01/2014  . Hypokalemia 05/01/2014  . GERD (gastroesophageal reflux disease) 02/14/2014  . Dysphagia, pharyngoesophageal phase 02/06/2014  . Type 2 diabetes mellitus without complication ([place]) 50/27/7412  . Constipation   . Neurogenic bowel   . 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 Metastatic cancer to spine ([place]) 01/19/2014  . Rotator cuff tendonitis   . Acute postoperative respiratory failure ([place]) 01/09/2014  . 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 Leg weakness, bilateral 12/13/2013    INOMVE, HMCNO [id number], PT 05/09/2015, 4:24 PM  [facility] [address] [place], [place], [address] Phone: [phone]   Fax:  [phone]  Name: Sarah [name] MRN: [id number] Date of Birth: 13-May-1965

## 2015-05-15 ENCOUNTER — Ambulatory Visit: Payer: BLUE CROSS/BLUE SHIELD | Admitting: Physical Therapy

## 2015-05-15 DIAGNOSIS — Z7409 Other reduced mobility: Secondary | ICD-10-CM

## 2015-05-15 DIAGNOSIS — M6281 Muscle weakness (generalized): Secondary | ICD-10-CM | POA: Diagnosis not present

## 2015-05-15 DIAGNOSIS — R262 Difficulty in walking, not elsewhere classified: Secondary | ICD-10-CM

## 2015-05-16 ENCOUNTER — Ambulatory Visit: Payer: BLUE CROSS/BLUE SHIELD | Admitting: Physical Medicine & Rehabilitation

## 2015-05-18 ENCOUNTER — Encounter: Payer: Self-pay | Admitting: Physical Therapy

## 2015-05-18 NOTE — Therapy (Signed)
[facility] [address] [place], [place], [address] Phone: [phone]   Fax:  [phone]  Physical Therapy Treatment  Patient Details  Name: Sarah [name] MRN: [id number] Date of Birth: Feb 03, 1966 No Data Recorded  Encounter Date: 05/15/2015      PT End of Session - 05/18/15 1131    Visit Number 62   Authorization - Visit Number 7   Authorization - Number of Visits 30   PT Start Time 4854   PT Stop Time 1102   PT Time Calculation (min) 44 min   Equipment Utilized During Treatment Gait belt      Past Medical History  Diagnosis Date  . Hypertension   . DDD (degenerative disc disease), cervical   . DJD (degenerative joint disease)   . Obesity   . Chronic back pain   . 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 History of rectal fissure   . Rotator cuff tendonitis right    Past Surgical History  Procedure Laterality Date  . Tonsillectomy    . Laminectomy N/A 12/14/2013    Procedure: THORACIC LAMINECTOMY FOR TUMOR THORACIC THREE;  Surgeon: Ashok Pall, MD;  Location: [facility];  Service: Neurosurgery;  Laterality: N/A;  THORACIC LAMINECTOMY FOR TUMOR THORACIC THREE  . Posterior lumbar fusion 4 level N/A 12/30/2013    Procedure: Thoracic one-Thoracic five posterior thoracic fusion with pedicle screws;  Surgeon: Ashok Pall, MD;  Location: [facility];  Service: Neurosurgery;  Laterality: N/A;  Thoracic one-Thoracic five posterior thoracic fusion with pedicle screws  . Thyroidectomy N/A 01/09/2014    Procedure: TOTAL THYROIDECTOMY;  Surgeon: Izora Gala, MD;  Location: [facility];  Service: ENT;  Laterality: N/A;    There were no vitals filed for this visit.  Visit Diagnosis:  Inability to walk  Decreased functional mobility and endurance  Muscle weakness (generalized)      Subjective Assessment - 05/18/15 1130    Subjective Pt states she is excited about going  to pool tomorrow (on Wed.); reports she was tired after last session of walking   [name] accompained by: Family member   Currently in Pain? No/denies      Pt gait trained 30' x 2 reps in unweighting system with +2 assist Pt stood inside bars with UE support - with min to mod assist x approx. 1"  Pt took 1 step with each foot with therapist blocking knee for stability to prevent buckling of opposite knee   Pt able to transfer sit to stand from wheelchair to bars with min assist                          PT Short Term Goals - 10/05/14 0557    PT SHORT TERM GOAL #1   Title Pt. will transfer with sliding board wheelchair to mat with +1 max assist.  (target date 08-31-14)   Status Achieved   PT SHORT TERM GOAL #2   Title Sit unsupported on side of mat x 5" with +2 mod assist   Status Achieved   PT SHORT TERM GOAL #3   Title Independent to direct HEP (stretches and PROM for bil. LE's)   Status Achieved   PT SHORT TERM GOAL #4   Title Tolerate standing in standing frame x 10" for LE stretching and weight-bearing   (08-31-14)   Status Achieved   PT SHORT TERM GOAL #  5   Title Incr. endurance/activity tolerance so pt. able to perform 10' nonstop on recumbent bike Comptroller)  (08-31-14)   Status On-going           PT Long Term Goals - 02/25/15 1923    PT LONG TERM GOAL #1   Title Transfer with sliding board with +1 mod assist wheelchair to/from mat   Status Achieved   PT LONG TERM GOAL #2   Title Sit on side of mat for at least 10" with UE support prn with SBA to incr. independence with dressing, grooming,etc. at home  (09-30-14)   Status Achieved   PT LONG TERM GOAL #3   Title Sit unsupported on side of mat with 1 UE support and reach 4" anteriorly with other UE support with min assist to increase independence with ADL's with mod  (09-30-14)   Status Achieved   PT LONG TERM GOAL #4   Title Independent in updated HEP  (09-30-14)   Status Achieved   PT LONG TERM GOAL  #5   Title Pt. will transfer wheelchair to/from mat with sliding board with S only after set up for placement of board (11-04-14)   Status Achieved   Additional Long Term Goals   Additional Long Term Goals Yes   PT LONG TERM GOAL #6   Title Pt. will roll from supine to R and L modified independently on mat and will report ability to do so at home  (11-04-14)   Status Achieved   PT LONG TERM GOAL #7   Title Pt will transfer wheelchair to regular bed with sliding board with SBA per sister's report.  03-24-15   Time 4   Period Weeks   Status New   PT LONG TERM GOAL #8   Title Pt will report ability to roll supine to R and L sides in bed at home to avoid sleeping on back all night for reduced risk of skin breakdown.  03-24-15   Time 4   Period Weeks   Status New   PT LONG TERM GOAL  #9   TITLE Independent in updated HEP as appropriate.  03-24-15   Time 4   Period Weeks   Status New               Plan - 05/18/15 1132    Clinical Impression Statement Pt di much better with amb. with use of unweighting system today; pt took 1 step with each foot insdie parallel bars for first time today   Pt will benefit from skilled therapeutic intervention in order to improve on the following deficits Abnormal gait;Difficulty walking;Decreased balance;Decreased activity tolerance;Impaired tone;Decreased strength   Rehab Potential Good   PT Frequency 1x / week   PT Duration 4 weeks   PT Treatment/Interventions ADLs/Self Care Home Management;Therapeutic exercise;Balance training;Therapeutic activities;Functional mobility training;Gait training;Neuromuscular re-education;Patient/family education   PT Next Visit Plan standing/gait training        Problem List Patient Active Problem List   Diagnosis Date Noted  . 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 Dysuria 09/04/2014  . Hypertension 05/01/2014  . Ingrown right big toenail 05/01/2014  . Hypokalemia 05/01/2014  . GERD (gastroesophageal  reflux disease) 02/14/2014  . Dysphagia, pharyngoesophageal phase 02/06/2014  . Type 2 diabetes mellitus without complication ([place]) 32/67/1245  . Constipation   . Neurogenic bowel   . 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 ([place]) 01/20/2014  . Metastatic cancer to spine ([place]) 01/19/2014  . Rotator cuff tendonitis   . Acute postoperative respiratory failure (  [place]) 01/09/2014  . 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 Leg weakness, bilateral 12/13/2013    Alda Lea, PT 05/18/2015, 11:36 AM  [facility] [address] [place], [place], [address] Phone: [phone]   Fax:  [phone]  Name: Sarah [name] MRN: [id number] Date of Birth: 07-Jul-1965

## 2015-05-21 ENCOUNTER — Encounter: Payer: Self-pay | Admitting: Physical Medicine & Rehabilitation

## 2015-05-21 ENCOUNTER — Encounter
Payer: BLUE CROSS/BLUE SHIELD | Attending: Physical Medicine & Rehabilitation | Admitting: Physical Medicine & Rehabilitation

## 2015-05-21 VITALS — BP 120/75 | HR 86 | Resp 14

## 2015-05-21 DIAGNOSIS — I10 Essential (primary) hypertension: Secondary | ICD-10-CM | POA: Insufficient documentation

## 2015-05-21 DIAGNOSIS — G8220 Paraplegia, unspecified: Secondary | ICD-10-CM

## 2015-05-21 DIAGNOSIS — R29898 Other symptoms and signs involving the musculoskeletal system: Secondary | ICD-10-CM

## 2015-05-21 DIAGNOSIS — G839 Paralytic syndrome, unspecified: Secondary | ICD-10-CM | POA: Diagnosis not present

## 2015-05-21 DIAGNOSIS — M779 Enthesopathy, unspecified: Secondary | ICD-10-CM | POA: Diagnosis not present

## 2015-05-21 DIAGNOSIS — C73 Malignant neoplasm of thyroid gland: Secondary | ICD-10-CM

## 2015-05-21 DIAGNOSIS — Z85850 Personal history of malignant neoplasm of thyroid: Secondary | ICD-10-CM | POA: Insufficient documentation

## 2015-05-21 DIAGNOSIS — N319 Neuromuscular dysfunction of bladder, unspecified: Secondary | ICD-10-CM | POA: Diagnosis not present

## 2015-05-21 DIAGNOSIS — D4989 Neoplasm of unspecified behavior of other specified sites: Secondary | ICD-10-CM | POA: Diagnosis not present

## 2015-05-21 DIAGNOSIS — IMO0002 Reserved for concepts with insufficient information to code with codable children: Secondary | ICD-10-CM

## 2015-05-21 DIAGNOSIS — M1990 Unspecified osteoarthritis, unspecified site: Secondary | ICD-10-CM | POA: Diagnosis not present

## 2015-05-21 DIAGNOSIS — E669 Obesity, unspecified: Secondary | ICD-10-CM | POA: Diagnosis not present

## 2015-05-21 DIAGNOSIS — M5030 Other cervical disc degeneration, unspecified cervical region: Secondary | ICD-10-CM | POA: Insufficient documentation

## 2015-05-21 NOTE — Progress Notes (Signed)
Subjective:    Patient ID: Sarah [name], female    DOB: Jul 20, 1965, 50 y.o.   MRN: [id number]  HPI   The main purpose of today's visit is for a wheelchair evaluation. Harvey is seeking a manual chair to use within the community.   She continues with outpatient therapies. Working on short distance gait, strengthening, balance, etc.   Her pain levels have been minimal. She has had some improvement in her lower extremity strength as well. She is emptying her bowels and bladder without issues.   She is being followed now at [facility] her [facility]. No XRT is recommended at present. A PET scan is recommended for March.   Pain Inventory Average Pain 1 Pain Right Now 1 My pain is NA  In the last 24 hours, has pain interfered with the following? General activity NA Relation with others NA Enjoyment of life NA What TIME of day is your pain at its worst? daytime Sleep (in general) Good  Pain is worse with: sitting Pain improves with: NA Relief from Meds: NA  Mobility walk without assistance ability to climb steps?  no do you drive?  no use a wheelchair transfers alone Do you have any goals in this area?  yes  Function disabled: date disabled NA I need assistance with the following:  meal prep  Neuro/Psych tingling spasms  Prior Studies Any changes since last visit?  no  Physicians involved in your care Primary care . Neurologist . Neurosurgeon . Orthopedist .   Family History  Problem Relation Age of Onset  . Hypertension Mother   . Diabetes Mother   . Hypertension Father   . Stroke Father    Social History   Social History  . Marital Status: Single    Spouse Name: N/A  . Number of Children: N/A  . Years of Education: N/A   Social History Main Topics  . Smoking status: Never Smoker   . Smokeless tobacco: Never Used  . Alcohol Use: No  . Drug Use: No  . Sexual Activity: Not Currently   Other Topics Concern  . None   Social History Narrative   Past  Surgical History  Procedure Laterality Date  . Tonsillectomy    . Laminectomy N/A 12/14/2013    Procedure: THORACIC LAMINECTOMY FOR TUMOR THORACIC THREE;  Surgeon: Ashok Pall, MD;  Location: [facility];  Service: Neurosurgery;  Laterality: N/A;  THORACIC LAMINECTOMY FOR TUMOR THORACIC THREE  . Posterior lumbar fusion 4 level N/A 12/30/2013    Procedure: Thoracic one-Thoracic five posterior thoracic fusion with pedicle screws;  Surgeon: Ashok Pall, MD;  Location: [facility]S;  Service: Neurosurgery;  Laterality: N/A;  Thoracic one-Thoracic five posterior thoracic fusion with pedicle screws  . Thyroidectomy N/A 01/09/2014    Procedure: TOTAL THYROIDECTOMY;  Surgeon: Izora Gala, MD;  Location[facility] OR;  Service: ENT;  Laterality: N/A;   Past Medical History  Diagnosis Date  . Hypertension   . DDD (degenerative disc disease), cervical   . DJD (degenerative joint disease)   . Obesity   . Chronic back pain   . 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 History of rectal fissure   . Rotator cuff tendonitis right   BP 120/75 mmHg  Pulse 86  Resp 14  SpO2 100%  LMP  (Within Months)  Opioid Risk Score:   Fall Risk Score:  `1  Depression screen PHQ 2/9  Depression screen PHQ  2/9 12/26/2014 09/19/2014 04/20/2014 03/03/2014  Decreased Interest 0 0 0 0  Down, Depressed, Hopeless 1 1 0 1  PHQ - 2 Score 1 1 0 1  Altered sleeping - 0 - -  Tired, decreased energy - 1 - -  Change in appetite - 0 - -  Feeling bad or failure about yourself  - 0 - -  Trouble concentrating - 0 - -  Moving slowly or fidgety/restless - 0 - -  Suicidal thoughts - 0 - -  PHQ-9 Score - 2 - -     Review of Systems  Constitutional: Positive for diaphoresis.  Cardiovascular:       Limb swelling  Neurological:       Tingling Spasms   All other systems reviewed and are negative.      Objective:   Physical Exam  Constitutional: She is oriented to person, place, and  time. She appears well-developed and well-nourished.  HENT:  Head: Normocephalic and atraumatic.  Neck: Normal range of motion.  Cardiovascular: Normal rate and regular rhythm.  No murmur heard.  Respiratory: Effort normal and breath sounds normal. No respiratory distress. She has no wheezes. She exhibits no tenderness.  GI: Soft. Bowel sounds are normal. She exhibits no distension. There is no tenderness.  Musculoskeletal: She exhibits no edema and no tenderness.  Neurological: She is alert and oriented to person, place, and time. sensation 1/2 below lower/mid chest wall. Able to sense pain in all 4's.  Motor strength is 5/5 bilateral deltoids, biceps, triceps, grip  3/5 bilateral hip flexors, hip add/hip abd 3/5, 3 knee extensors, 3/5 knee flexion, 3/5 ankle dorsiflexor and plantar flexors., good trunk control Tone trace to 1 /4 left hip adductors, tr /4 gastrocs/hamstrings. T3-4 sensory level  Skin: toe nail stable  Psych: pleasant, in good spirits    Assessment/Plan:  1. 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   -As stated above the main reason for this visit is a wheelchair evaluation. I have reviewed and concur with the PT evaluation. She would benefit from an ultra-light weight w/c which would allow her to be more mobile in the community and to transport the manual chair easily outside of her home. Sarah [name] has gained better trunk strength and possesses adequate upper extremity strength to power the ultra-light weight manual chair on her own. She cannot utilize a walker or cane at this point due to ongoing lower extremity weakness and sensory loss. She is quite motivated to utilize the [employer] manual chair on her own and is competent to use it safely.  2. Pain Management/spasticity:  -tylenol prn  -maintain baclofen at '20mg'$  TID  -continue with regular stretching  4. Neurogenic bladder: voiding  much improved 5. Neurogenic bowel/GI: no change a -suppository if needed  6. Continue with neuro-rehab: continue as she's making functional gains.  7. HTN: off lisinopril  8. 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 Thirty minutes of face to face patient care time were spent during this visit. All questions were encouraged and answered.

## 2015-05-22 ENCOUNTER — Ambulatory Visit: Payer: BLUE CROSS/BLUE SHIELD | Admitting: Physical Therapy

## 2015-05-22 DIAGNOSIS — M6281 Muscle weakness (generalized): Secondary | ICD-10-CM

## 2015-05-22 DIAGNOSIS — Z7409 Other reduced mobility: Secondary | ICD-10-CM

## 2015-05-22 DIAGNOSIS — G8220 Paraplegia, unspecified: Secondary | ICD-10-CM

## 2015-05-23 NOTE — Therapy (Signed)
[facility] [address] [place], [place], [address] Phone: [phone]   Fax:  [phone]  Physical Therapy Treatment  Patient Details  Name: Sarah [name] MRN: [id number] Date of Birth: [birth year] No Data Recorded  Encounter Date: 05/22/2015      PT End of Session - 05/23/15 1846    Visit Number 39   Number of Visits 40   Date for PT Re-Evaluation 05/16/15   Authorization Type [employer]   Authorization Time Period 03-25-15 - 03-23-16   Authorization - Visit Number 8   Authorization - Number of Visits 30   PT Start Time 1102   PT Stop Time 1147   PT Time Calculation (min) 45 min   Equipment Utilized During Treatment Gait belt      Past Medical History  Diagnosis Date  . Hypertension   . DDD (degenerative disc disease), cervical   . DJD (degenerative joint disease)   . Obesity   . Chronic back pain   . 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 History of rectal fissure   . Rotator cuff tendonitis right    Past Surgical History  Procedure Laterality Date  . Tonsillectomy    . Laminectomy N/A 12/14/2013    Procedure: THORACIC LAMINECTOMY FOR TUMOR THORACIC THREE;  Surgeon: Ashok Pall, MD;  Location: [facility];  Service: Neurosurgery;  Laterality: N/A;  THORACIC LAMINECTOMY FOR TUMOR THORACIC THREE  . Posterior lumbar fusion 4 level N/A 12/30/2013    Procedure: Thoracic one-Thoracic five posterior thoracic fusion with pedicle screws;  Surgeon: Ashok Pall, MD;  Location: [facility];  Service: Neurosurgery;  Laterality: N/A;  Thoracic one-Thoracic five posterior thoracic fusion with pedicle screws  . Thyroidectomy N/A 01/09/2014    Procedure: TOTAL THYROIDECTOMY;  Surgeon: Izora Gala, MD;  Location: [facility];  Service: ENT;  Laterality: N/A;    There were no vitals filed for this visit.  Visit Diagnosis:  Generalized muscle weakness  Decreased functional mobility  and endurance  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 [facility])      Subjective Assessment - 05/23/15 1841    Subjective Pt states she saw Dr. Naaman Plummer earlier this morning - pt reports no problems/changes   [name] accompained by: Family member   Patient Stated Goals Improve mobility   Currently in Pain? No/denies                         [facility] Adult PT Treatment/Exercise - 05/23/15 0001    Ambulation/Gait   Ambulation/Gait Yes   Ambulation/Gait Assistance 3: Mod assist   Ambulation/Gait Assistance Details blocked pt's knees with therapist's knee to prevent buckling   Ambulation Distance (Feet) 10 Feet  4 reps   Assistive device Parallel bars   Gait Pattern Step-to pattern   Ambulation Surface Level;Indoor   Lumbar Exercises: Machines for Strengthening   Leg Press 60# 10 reps 1 set; 50# 10 reps      Pt stood inside bars without UE support for approx. 3-4 secs - 3 trials - used bars as LOB occurred  Pt transferred from manual wheelchair to leg press with sliding board with mod assist; from leg press to wheelchair with  Sliding board with min assist            PT Short Term Goals - 10/05/14 0557    PT SHORT TERM GOAL #1   Title Pt. will transfer with  sliding board wheelchair to mat with +1 max assist.  (target date 08-31-14)   Status Achieved   PT SHORT TERM GOAL #2   Title Sit unsupported on side of mat x 5" with +2 mod assist   Status Achieved   PT SHORT TERM GOAL #3   Title Independent to direct HEP (stretches and PROM for bil. LE's)   Status Achieved   PT SHORT TERM GOAL #4   Title Tolerate standing in standing frame x 10" for LE stretching and weight-bearing   (08-31-14)   Status Achieved   PT SHORT TERM GOAL #5   Title Incr. endurance/activity tolerance so pt. able to perform 10' nonstop on recumbent bike Comptroller)  (08-31-14)   Status On-going           PT Long Term Goals - 02/25/15 1923    PT LONG TERM GOAL #1   Title Transfer with sliding board with +1  mod assist wheelchair to/from mat   Status Achieved   PT LONG TERM GOAL #2   Title Sit on side of mat for at least 10" with UE support prn with SBA to incr. independence with dressing, grooming,etc. at home  (09-30-14)   Status Achieved   PT LONG TERM GOAL #3   Title Sit unsupported on side of mat with 1 UE support and reach 4" anteriorly with other UE support with min assist to increase independence with ADL's with mod  (09-30-14)   Status Achieved   PT LONG TERM GOAL #4   Title Independent in updated HEP  (09-30-14)   Status Achieved   PT LONG TERM GOAL #5   Title Pt. will transfer wheelchair to/from mat with sliding board with S only after set up for placement of board (11-04-14)   Status Achieved   Additional Long Term Goals   Additional Long Term Goals Yes   PT LONG TERM GOAL #6   Title Pt. will roll from supine to R and L modified independently on mat and will report ability to do so at home  (11-04-14)   Status Achieved   PT LONG TERM GOAL #7   Title Pt will transfer wheelchair to regular bed with sliding board with SBA per sister's report.  03-24-15   Time 4   Period Weeks   Status New   PT LONG TERM GOAL #8   Title Pt will report ability to roll supine to R and L sides in bed at home to avoid sleeping on back all night for reduced risk of skin breakdown.  03-24-15   Time 4   Period Weeks   Status New   PT LONG TERM GOAL  #9   TITLE Independent in updated HEP as appropriate.  03-24-15   Time 4   Period Weeks   Status New               Plan - 05/23/15 1847    Clinical Impression Statement Pt amb. inside parallel bars - entire length of bars- for first time today; also performed leg press exercise for 1st time today; pt's mobility is improving   Pt will benefit from skilled therapeutic intervention in order to improve on the following deficits Abnormal gait;Difficulty walking;Decreased balance;Decreased activity tolerance;Impaired tone;Decreased strength   Rehab Potential  Good   PT Frequency 1x / week   PT Duration 4 weeks   PT Treatment/Interventions ADLs/Self Care Home Management;Therapeutic exercise;Balance training;Therapeutic activities;Functional mobility training;Gait training;Neuromuscular re-education;Patient/family education   PT Next Visit Plan standing/gait training  PT Home Exercise Plan given green theraband for PRE's with HEP   Consulted and Agree with Plan of Care Patient   Family Member Consulted pt's sister-[name]        Problem List Patient Active Problem List   Diagnosis Date Noted  . 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 Dysuria 09/04/2014  . Hypertension 05/01/2014  . Ingrown right big toenail 05/01/2014  . Hypokalemia 05/01/2014  . GERD (gastroesophageal reflux disease) 02/14/2014  . Dysphagia, pharyngoesophageal phase 02/06/2014  . Type 2 diabetes mellitus without complication ([place]) 19/37/9024  . Constipation   . Neurogenic bowel   . 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 Metastatic cancer to spine ([place]) 01/19/2014  . Rotator cuff tendonitis   . Acute postoperative respiratory failure ([place]) 01/09/2014  . 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 Leg weakness, bilateral 12/13/2013    OXBDZH, GDJME [id number], PT 05/23/2015, 6:52 PM  [facility] [address] [place], [place], [address] Phone: [phone]   Fax:  [phone]  Name: Sarah [name] MRN: [id number] Date of Birth: 1966/01/10

## 2015-05-28 ENCOUNTER — Ambulatory Visit: Payer: BLUE CROSS/BLUE SHIELD | Admitting: Physical Therapy

## 2015-05-29 ENCOUNTER — Ambulatory Visit: Payer: BLUE CROSS/BLUE SHIELD | Attending: Family Medicine | Admitting: Physical Therapy

## 2015-05-29 DIAGNOSIS — M6281 Muscle weakness (generalized): Secondary | ICD-10-CM | POA: Insufficient documentation

## 2015-05-29 DIAGNOSIS — G839 Paralytic syndrome, unspecified: Secondary | ICD-10-CM | POA: Insufficient documentation

## 2015-05-29 DIAGNOSIS — R262 Difficulty in walking, not elsewhere classified: Secondary | ICD-10-CM | POA: Insufficient documentation

## 2015-05-29 DIAGNOSIS — Z7409 Other reduced mobility: Secondary | ICD-10-CM | POA: Insufficient documentation

## 2015-05-30 ENCOUNTER — Encounter: Payer: Self-pay | Admitting: Physical Therapy

## 2015-05-30 NOTE — Therapy (Signed)
[facility] [address] [place], [place], [address] Phone: [phone]   Fax:  [phone]  Physical Therapy Treatment  Patient Details  Name: Sarah [name] MRN: [id number] Date of Birth: 1965-11-19 No Data Recorded  Encounter Date: 05/29/2015      PT End of Session - 05/30/15 1647    Visit Number 40   Number of Visits 40   Date for PT Re-Evaluation 06/29/15   Authorization Type BC[employer]   Authorization Time Period 03-25-15 - 03-23-16   Authorization - Visit Number 9   Authorization - Number of Visits 30   PT Start Time 1232   PT Stop Time 1320   PT Time Calculation (min) 48 min   Equipment Utilized During Treatment Gait belt      Past Medical History  Diagnosis Date  . Hypertension   . DDD (degenerative disc disease), cervical   . DJD (degenerative joint disease)   . Obesity   . Chronic back pain   . 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 History of rectal fissure   . Rotator cuff tendonitis right    Past Surgical History  Procedure Laterality Date  . Tonsillectomy    . Laminectomy N/A 12/14/2013    Procedure: THORACIC LAMINECTOMY FOR TUMOR THORACIC THREE;  Surgeon: Ashok Pall, MD;  Location: [place] [facility];  Service: Neurosurgery;  Laterality: N/A;  THORACIC LAMINECTOMY FOR TUMOR THORACIC THREE  . Posterior lumbar fusion 4 level N/A 12/30/2013    Procedure: Thoracic one-Thoracic five posterior thoracic fusion with pedicle screws;  Surgeon: Ashok Pall, MD;  Location: [place] NEUR[facility] ORS;  Service: Neurosurgery;  Laterality: N/A;  Thoracic one-Thoracic five posterior thoracic fusion with pedicle screws  . Thyroidectomy N/A 01/09/2014    Procedure: TOTAL THYROIDECTOMY;  Surgeon: Izora Gala, MD;  Location: [facility];  Service: ENT;  Laterality: N/A;    There were no vitals filed for this visit.  Visit Diagnosis:  Generalized muscle weakness - Plan: PT plan of care  cert/re-cert  Inability to walk - Plan: PT plan of care cert/re-cert  Decreased functional mobility and endurance - Plan: PT plan of care cert/re-cert      Subjective Assessment - 05/30/15 1644    Subjective Pt states she is now in a regular bed - no longer in a hosptial bed at home; states she is able to get in/out bed independently   [name] accompained by: Family member   Patient Stated Goals Improve mobility   Currently in Pain? No/denies                         [facility] Adult PT Treatment/Exercise - 05/30/15 0001    Ambulation/Gait   Ambulation/Gait Yes   Ambulation/Gait Assistance 3: Mod assist   Ambulation/Gait Assistance Details blocked pt's knees to prevent buckling   Assistive device Parallel bars   Gait Pattern Step-to pattern   Ambulation Surface Level;Indoor     Pt. Gait trained 10' x 4 reps inside bars with mod assist +1 (seated rest period after 10' amb. Distance)  With RW - 6' x 1 rep with RW with mod to max assist;  8' x 1 rep with RW with mod assist +2 and 1 person to push wheelchair  Pt requires total assist to perform sit to stand transfer from manual wheelchair to RW             PT  Short Term Goals - 05/30/15 1657    Additional Short Term Goals   Additional Short Term Goals Yes   PT SHORT TERM GOAL #6   Title Transfer sit to stand with +1 mod assist (wheelchair to RW).  (06-29-15)   Baseline max assist +3 required   Time 4   Period Weeks   Status New   PT SHORT TERM GOAL #7   Title Stand for 3" at counter for LE weight-bearing with +1 min assist.  (06-29-15)   Time 4   Period Weeks   Status New   PT SHORT TERM GOAL #8   Title Amb. 10' with RW with mod assist.  (06-29-15)   Time 4   Period Weeks   Status New           PT Long Term Goals - 05/30/15 1701    PT LONG TERM GOAL  #11   TITLE Perform sit to stand transfer wheelchair to RW with min assist. (07-29-15)   Time 8   Period Weeks   Status New   PT LONG TERM GOAL   #12   TITLE Amb. 11' with RW with min assist for incr. household amb.  (07-29-15)   Time 8   Period Weeks   Status New               Plan - 05/30/15 1648    Clinical Impression Statement Pt amb. with RW for 1st time today with +2 mod assist; pt fearful of falling with use of RW for assistance with amb.; pt has difficutly with sit to stand transfer to RW   Pt will benefit from skilled therapeutic intervention in order to improve on the following deficits Abnormal gait;Difficulty walking;Decreased balance;Decreased activity tolerance;Impaired tone;Decreased strength   Rehab Potential Good   PT Frequency 1x / week   PT Duration 4 weeks   PT Treatment/Interventions ADLs/Self Care Home Management;Therapeutic exercise;Balance training;Therapeutic activities;Functional mobility training;Gait training;Neuromuscular re-education;Patient/family education   PT Next Visit Plan standing/gait training   PT Home Exercise Plan given green theraband for PRE's with HEP   Consulted and Agree with Plan of Care Patient   Family Member Consulted pt's sister-[name]        Problem List Patient Active Problem List   Diagnosis Date Noted  . 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 Dysuria 09/04/2014  . Hypertension 05/01/2014  . Ingrown right big toenail 05/01/2014  . Hypokalemia 05/01/2014  . GERD (gastroesophageal reflux disease) 02/14/2014  . Dysphagia, pharyngoesophageal phase 02/06/2014  . Type 2 diabetes mellitus without complication ([place]) 83/66/2947  . Constipation   . Neurogenic bowel   . 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 ([place]) 01/20/2014  . Metastatic cancer to spine ([place]) 01/19/2014  . Rotator cuff tendonitis   . Acute postoperative respiratory failure ([place]) 01/09/2014  . 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 Hurthle cell carcinoma of thyroid ([address]) 12/29/2013  . Leg weakness, bilateral 12/13/2013    MLYYTK, PTWSF KCLEXNT, PT 05/30/2015, 5:37 PM  [facility] [address] [place], [place], [address] Phone: [phone]   Fax:  [phone]  Name: Sarah [name] MRN: [id number] Date of Birth: 1966/03/17

## 2015-06-05 ENCOUNTER — Ambulatory Visit: Payer: BLUE CROSS/BLUE SHIELD | Admitting: Physical Therapy

## 2015-06-07 ENCOUNTER — Encounter: Payer: Self-pay | Admitting: Family Medicine

## 2015-06-07 ENCOUNTER — Ambulatory Visit (INDEPENDENT_AMBULATORY_CARE_PROVIDER_SITE_OTHER): Payer: BLUE CROSS/BLUE SHIELD | Admitting: Family Medicine

## 2015-06-07 VITALS — BP 134/72 | HR 78 | Temp 98.0°F

## 2015-06-07 DIAGNOSIS — I10 Essential (primary) hypertension: Secondary | ICD-10-CM | POA: Diagnosis not present

## 2015-06-07 DIAGNOSIS — R609 Edema, unspecified: Secondary | ICD-10-CM | POA: Diagnosis not present

## 2015-06-07 DIAGNOSIS — G8220 Paraplegia, unspecified: Secondary | ICD-10-CM

## 2015-06-07 DIAGNOSIS — R1314 Dysphagia, pharyngoesophageal phase: Secondary | ICD-10-CM | POA: Diagnosis not present

## 2015-06-07 DIAGNOSIS — R600 Localized edema: Secondary | ICD-10-CM | POA: Insufficient documentation

## 2015-06-07 DIAGNOSIS — Z79899 Other long term (current) drug therapy: Secondary | ICD-10-CM

## 2015-06-07 DIAGNOSIS — G839 Paralytic syndrome, unspecified: Secondary | ICD-10-CM

## 2015-06-07 LAB — CBC
HEMATOCRIT: 33.9 % — AB (ref 36.0–46.0)
HEMOGLOBIN: 11.5 g/dL — AB (ref 12.0–15.0)
MCH: 30.3 pg (ref 26.0–34.0)
MCHC: 33.9 g/dL (ref 30.0–36.0)
MCV: 89.4 fL (ref 78.0–100.0)
MPV: 8.8 fL (ref 8.6–12.4)
Platelets: 210 10*3/uL (ref 150–400)
RBC: 3.79 MIL/uL — ABNORMAL LOW (ref 3.87–5.11)
RDW: 14 % (ref 11.5–15.5)
WBC: 4.1 10*3/uL (ref 4.0–10.5)

## 2015-06-07 LAB — POCT GLYCOSYLATED HEMOGLOBIN (HGB A1C): Hemoglobin A1C: 5.7

## 2015-06-07 MED ORDER — LISINOPRIL 10 MG PO TABS: 10.0000 mg | ORAL_TABLET | Freq: Every day | ORAL | Status: DC

## 2015-06-07 MED ORDER — ONDANSETRON HCL 4 MG PO TABS: 4.0000 mg | ORAL_TABLET | Freq: Four times a day (QID) | ORAL | Status: DC

## 2015-06-07 NOTE — Assessment & Plan Note (Addendum)
Worsening: Patient's hypertension is slightly worsened from previous. Will increase lisinopril to 10 mg daily. - We'll check labs today  (Consider removing DM2 from problem list if A1c is again normal)

## 2015-06-07 NOTE — Assessment & Plan Note (Signed)
Improving: Patient's lower extremity use appears to be improving every day. Physical therapy is likely her greatest benefit at this time. [name] very motivated to make improvements in this positive attitude will likely benefit in her long-term health. - Continue PT

## 2015-06-07 NOTE — Assessment & Plan Note (Signed)
[name] reporting new onset bilateral lower extremity edema. Etiology unknown however most likely diagnosis is lower extremity stasis secondary to paraplegia, with possible venous insufficiency. Extremely low suspicion for CHF as patient has no other symptoms suggesting this. DVT was considered but also deemed unlikely due to the bilateral presentation and lack of tenderness to either leg. Wells score of 1.  - Encouraged regular elevation of her lower extremities when possible. I also asked that she proper legs up when sleeping at night with 1-2 pillows. - If edema continues or worsens I will reevaluate for possible DVT/CHF. If these diagnoses are again found to be unlikely I would then consider regular use of compression hose.

## 2015-06-07 NOTE — Progress Notes (Signed)
HPI  CC: Lower extremity swelling [name] here to discuss her increased lower extremity swelling. Swelling is equal bilaterally. She states that over the past month she has had increased lower extremity swelling which she did not have before. She states that she does not regularly elevate her feet and because of her gradual progress with physical therapy she has spent more time attempting to use her legs which requires her legs to be down. Swelling is better first thing in the morning when she wakes up. She denies any shortness of breath, headache, chest pain, abdominal pain, abdominal fullness/tightness, dysphagia, facial swelling, fevers, chills, or lower extremity pain.  Hypertension: Patient states she has been very compliant with her medication regimen at this time. She denies any side effects or unwanted symptoms from her medication. No other issues at this time  Paraplegia: Patient's paraplegia has been gradually improving with physical therapy. She continues to be, for the most part, wheelchair bound. However, she states that her strength is gradually improving and she is now attempting to use a walker during physical therapy sessions. Sensation in her lower extremities have remained stable from her new baseline, with some possible improvement in the region of her thighs according to the patient. She no longer needs the Foley catheter which had been placed in the past. No new issues.  Review of Systems   See HPI for ROS. All other systems reviewed and are negative.  CC, SH/smoking status, and VS noted  Objective: BP 134/72 mmHg  Pulse 78  Temp(Src) 98 F (36.7 C) (Oral)  LMP  (Within Months) Gen: NAD, alert, cooperative, and pleasant. HEENT: NCAT, EOMI, PERRL, MMM, surgical scar noted at the area of the thyroid CV: RRR, no murmur Resp: CTAB, no wheezes, non-labored Ext:  +2 pitting edema in bilateral lower extremities to the level of the proximal tibia, both extremities equally  cool to the touch (patient's baseline). No pain elicited with squeezing of the calf muscles (but patient could feel this). No erythema, warmth, or ulcerations appreciated. Neuro: Alert and oriented, Speech clear, patient's baseline lower body deficits are at previous baseline or improved since last visit.  Assessment and plan:  Peripheral edema [name] reporting new onset bilateral lower extremity edema. Etiology unknown however most likely diagnosis is lower extremity stasis secondary to paraplegia, with possible venous insufficiency. Extremely low suspicion for CHF as patient has no other symptoms suggesting this. DVT was considered but also deemed unlikely due to the bilateral presentation and lack of tenderness to either leg. Wells score of 1.  - Encouraged regular elevation of her lower extremities when possible. I also asked that she proper legs up when sleeping at night with 1-2 pillows. - If edema continues or worsens I will reevaluate for possible DVT/CHF. If these diagnoses are again found to be unlikely I would then consider regular use of compression hose.  Hypertension Worsening: Patient's hypertension is slightly worsened from previous. Will increase lisinopril to 10 mg daily. - We'll check labs today  (Consider removing DM2 from problem list if A1c is again normal)  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 Improving: Patient's lower extremity use appears to be improving every day. Physical therapy is likely her greatest benefit at this time. [name] very motivated to make improvements in this positive attitude will likely benefit in her long-term health. - Continue PT    Orders Placed This Encounter  Procedures  . Lipid panel  . CBC  . BASIC METABOLIC PANEL WITH GFR  .  POCT glycosylated hemoglobin (Hb A1C)    Meds ordered this encounter  Medications  . ondansetron (ZOFRAN) 4 MG tablet    Sig: Take 1 tablet (4 mg total) by mouth every 6 (six) hours.    Dispense:  30 tablet     Refill:  3  . lisinopril (PRINIVIL,ZESTRIL) 10 MG tablet    Sig: Take 1 tablet (10 mg total) by mouth daily.    Dispense:  90 tablet    Refill:  3     Elberta Leatherwood, MD,MS,  PGY2 06/07/2015 6:26 PM

## 2015-06-07 NOTE — Patient Instructions (Signed)
It was a pleasure seeing you today in our clinic. Today we discussed your leg swelling. Here is the treatment plan we have discussed and agreed upon together:   - I have increased her lisinopril from 5 mg to 10 mg. If you have any issues with dizziness lightheadedness nausea or vomiting please call our office and go back to your original 5 mg dosing. - I've ordered for labs to be performed today I will mail you these results. - I like to have you elevate your legs as often as possible throughout your day. I believe that because you are unable to use your legs as you use to you're having a significant amount of fluid collecting in your feet. Also, I would like to have you elevate your legs on 1-2 pillows while sleeping. - Do not hesitate to call if you have any issues between now and our next visit.

## 2015-06-08 LAB — BASIC METABOLIC PANEL WITH GFR
BUN: 12 mg/dL (ref 7–25)
CALCIUM: 8.8 mg/dL (ref 8.6–10.2)
CO2: 27 mmol/L (ref 20–31)
CREATININE: 0.94 mg/dL (ref 0.50–1.10)
Chloride: 104 mmol/L (ref 98–110)
GFR, EST NON AFRICAN AMERICAN: 71 mL/min (ref >=60)
GFR, Est African American: 82 mL/min (ref >=60)
GLUCOSE: 93 mg/dL (ref 65–99)
Potassium: 4.4 mmol/L (ref 3.5–5.3)
Sodium: 141 mmol/L (ref 135–146)

## 2015-06-08 LAB — LIPID PANEL
CHOLESTEROL: 178 mg/dL (ref 125–200)
HDL: 56 mg/dL (ref >=46)
LDL Cholesterol: 98 mg/dL (ref <130)
TRIGLYCERIDES: 121 mg/dL (ref <150)
Total CHOL/HDL Ratio: 3.2 Ratio (ref <=5.0)
VLDL: 24 mg/dL (ref <30)

## 2015-06-12 ENCOUNTER — Ambulatory Visit: Payer: BLUE CROSS/BLUE SHIELD | Admitting: Physical Therapy

## 2015-06-12 DIAGNOSIS — M6281 Muscle weakness (generalized): Secondary | ICD-10-CM

## 2015-06-12 DIAGNOSIS — G8220 Paraplegia, unspecified: Secondary | ICD-10-CM

## 2015-06-13 ENCOUNTER — Encounter: Payer: Self-pay | Admitting: Physical Therapy

## 2015-06-13 NOTE — Therapy (Signed)
[facility] [address] [place], [place], [address] Phone: [phone]   Fax:  [phone]  Physical Therapy Treatment  Patient Details  Name: Sarah [name] MRN: [id number] Date of Birth: 1965-03-26 No Data Recorded  Encounter Date: 06/12/2015      PT End of Session - 06/13/15 1045    Visit Number 41   Number of Visits 52   Date for PT Re-Evaluation 06/29/15   Authorization Type BC[employer]   Authorization Time Period 03-25-15 - 03-23-16   Authorization - Visit Number 10   Authorization - Number of Visits 30   PT Start Time 0569  pt arrived 61" late due to transportation issue   PT Stop Time 1103   PT Time Calculation (min) 19 min      Past Medical History  Diagnosis Date  . Hypertension   . DDD (degenerative disc disease), cervical   . DJD (degenerative joint disease)   . Obesity   . Chronic back pain   . 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 History of rectal fissure   . Rotator cuff tendonitis right    Past Surgical History  Procedure Laterality Date  . Tonsillectomy    . Laminectomy N/A 12/14/2013    Procedure: THORACIC LAMINECTOMY FOR TUMOR THORACIC THREE;  Surgeon: Ashok Pall, MD;  Location: [place] NEUR[facility] ORS;  Service: Neurosurgery;  Laterality: N/A;  THORACIC LAMINECTOMY FOR TUMOR THORACIC THREE  . Posterior lumbar fusion 4 level N/A 12/30/2013    Procedure: Thoracic one-Thoracic five posterior thoracic fusion with pedicle screws;  Surgeon: Ashok Pall, MD;  Location: [place] NEURO ORS;  Service: Neurosurgery;  Laterality: N/A;  Thoracic one-Thoracic five posterior thoracic fusion with pedicle screws  . Thyroidectomy N/A 01/09/2014    Procedure: TOTAL THYROIDECTOMY;  Surgeon: Izora Gala, MD;  Location: [facility] [facility];  Service: ENT;  Laterality: N/A;    There were no vitals filed for this visit.  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 spasms in legs   Patient Stated Goals Improve mobility   Currently in Pain? No/denies   Multiple Pain Sites No                         [facility] Adult PT Treatment/Exercise - 06/13/15 0001    Knee/Hip Exercises: Aerobic   Stationary Bike SciFit level 2.0 x 10" with UE and LE's  2nd 5" at level 1.5 due to incr. spasms in RLE     Self care; discussed need to stretch LE's frequently during the day and also to stand at counter several times a day for incr. weight bearing to decrease spasms in bil. LE's - pt verbalized understanding  Pt requested not to attempt standing in bars today due to incr. Spasms in LE's         PT Education - 06/13/15 1045    Education provided Yes   Education Details Recommend stretches and standing to decr. spasms in bil. LE's   Person(s) Educated Patient   Methods Explanation   Comprehension Verbalized understanding          PT Short Term Goals - 05/30/15 1657  Additional Short Term Goals   Additional Short Term Goals Yes   PT SHORT TERM GOAL #6   Title Transfer sit to stand with +1 mod assist (wheelchair to RW).  (06-29-15)   Baseline max assist +3 required   Time 4   Period Weeks   Status New   PT SHORT TERM GOAL #7   Title Stand for 3" at counter for LE weight-bearing with +1 min assist.  (06-29-15)   Time 4   Period Weeks   Status New   PT SHORT TERM GOAL #8   Title Amb. 10' with RW with mod assist.  (06-29-15)   Time 4   Period Weeks   Status New           PT Long Term Goals - 06/13/15 1054    PT LONG TERM GOAL  #10   TITLE Pt will transfer sit to stand from wheelchair to RW with +1 min assist.  (07-29-15)   Time 8   Period Weeks   Status New   PT LONG TERM GOAL  #11   TITLE Perform sit to stand  transfer wheelchair to RW with min assist. (07-29-15)   PT LONG TERM GOAL  #12   TITLE Amb. 6' with RW with min assist for incr. household amb.  (07-29-15)   Time 8   Period Weeks   Status New               Plan - 06/13/15 1050    Clinical Impression Statement Increased spasms noted in RLE during exercise on SciFit; pt requested not to attempt standing due to incr. spasms in legs; pt reports incr. edema in bil. LE's as well; pt saw Dr. Alease Frame on 06-07-15 for LE edema   Pt will benefit from skilled therapeutic intervention in order to improve on the following deficits Abnormal gait;Difficulty walking;Decreased balance;Decreased activity tolerance;Impaired tone;Decreased strength   Rehab Potential Good   PT Frequency 1x / week   PT Duration 8 weeks   PT Treatment/Interventions ADLs/Self Care Home Management;Therapeutic exercise;Balance training;Therapeutic activities;Functional mobility training;Gait training;Neuromuscular re-education;Patient/family education   PT Next Visit Plan standing/gait training   PT Home Exercise Plan given green theraband for PRE's with HEP   Consulted and Agree with Plan of Care Patient        Problem List Patient Active Problem List   Diagnosis Date Noted  . Peripheral edema 06/07/2015  . Hypothyroidism, postop 03/08/2015  . 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 Dysuria 09/04/2014  . Hypertension 05/01/2014  . Ingrown right big toenail 05/01/2014  . Hypokalemia 05/01/2014  . GERD (gastroesophageal reflux disease) 02/14/2014  . Dysphagia, pharyngoesophageal phase 02/06/2014  . Type 2 diabetes mellitus without complication ([place]) 85/04/7739  . Constipation   . Neurogenic bowel   . 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 Metastatic cancer to spine ([place]) 01/19/2014  . Rotator cuff tendonitis   . Acute postoperative respiratory failure ([place]) 01/09/2014  . Hurthle cell neoplasm  of thyroid 12/30/2013  . 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 Leg weakness, bilateral 12/13/2013    Alda Lea, PT 06/13/2015, 10:57 AM  [facility] [address] [place], [place], [address] Phone: [phone]   Fax:  [phone]  Name: OMELIA MARQUART MRN: [id number] Date of Birth: 1966-02-02

## 2015-06-13 NOTE — Patient Instructions (Signed)
Instructed pt to stretch LE's frequently during the day and also to stand for weight-bearing in attempt to decrease spasms in bil. LE's

## 2015-06-14 ENCOUNTER — Telehealth: Payer: Self-pay | Admitting: Family Medicine

## 2015-06-14 NOTE — Telephone Encounter (Signed)
Need results from 3/16 visit.

## 2015-06-15 ENCOUNTER — Encounter: Payer: Self-pay | Admitting: Family Medicine

## 2015-06-15 NOTE — Telephone Encounter (Signed)
Results have been mailed

## 2015-06-19 ENCOUNTER — Ambulatory Visit: Payer: BLUE CROSS/BLUE SHIELD | Admitting: Physical Therapy

## 2015-06-25 ENCOUNTER — Ambulatory Visit: Payer: BLUE CROSS/BLUE SHIELD | Attending: Family Medicine | Admitting: Physical Therapy

## 2015-06-25 ENCOUNTER — Ambulatory Visit: Payer: BLUE CROSS/BLUE SHIELD | Admitting: Physical Medicine & Rehabilitation

## 2015-06-25 DIAGNOSIS — R2689 Other abnormalities of gait and mobility: Secondary | ICD-10-CM | POA: Diagnosis present

## 2015-06-25 DIAGNOSIS — R252 Cramp and spasm: Secondary | ICD-10-CM | POA: Insufficient documentation

## 2015-06-25 DIAGNOSIS — M6281 Muscle weakness (generalized): Secondary | ICD-10-CM | POA: Diagnosis not present

## 2015-06-25 DIAGNOSIS — R261 Paralytic gait: Secondary | ICD-10-CM

## 2015-06-27 NOTE — Therapy (Signed)
[facility] [address] [place], [place], [address] Phone: [phone]   Fax:  [phone]  Physical Therapy Treatment  Patient Details  Name: Sarah [name] MRN: [id number] Date of Birth: June 01, 1965 No Data Recorded  Encounter Date: 06/25/2015      PT End of Session - 06/27/15 0927    Visit Number 42   Number of Visits 45   Date for PT Re-Evaluation 06/29/15   Authorization Type [employer]   Authorization Time Period 03-25-15 - 03-23-16   Authorization - Visit Number 11   Authorization - Number of Visits 30   PT Start Time 1017   PT Stop Time 1101   PT Time Calculation (min) 44 min      Past Medical History  Diagnosis Date  . Hypertension   . DDD (degenerative disc disease), cervical   . DJD (degenerative joint disease)   . Obesity   . Chronic back pain   . 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 History of rectal fissure   . Rotator cuff tendonitis right    Past Surgical History  Procedure Laterality Date  . Tonsillectomy    . Laminectomy N/A 12/14/2013    Procedure: THORACIC LAMINECTOMY FOR TUMOR THORACIC THREE;  Surgeon: Ashok Pall, MD;  Location: [facility];  Service: Neurosurgery;  Laterality: N/A;  THORACIC LAMINECTOMY FOR TUMOR THORACIC THREE  . Posterior lumbar fusion 4 level N/A 12/30/2013    Procedure: Thoracic one-Thoracic five posterior thoracic fusion with pedicle screws;  Surgeon: Ashok Pall, MD;  Location: [facility];  Service: Neurosurgery;  Laterality: N/A;  Thoracic one-Thoracic five posterior thoracic fusion with pedicle screws  . Thyroidectomy N/A 01/09/2014    Procedure: TOTAL THYROIDECTOMY;  Surgeon: Izora Gala, MD;  Location: [facility];  Service: ENT;  Laterality: N/A;    There were no vitals filed for this visit.  Visit Diagnosis:  Muscle weakness (generalized)  Cramp and spasm      Subjective Assessment - 06/27/15 0919    Subjective  Pt states she is having much more difficulty transferring on/off bedside commode at home due to incr. spasticity in legs; states she has appt with Dr. Christella Noa next week "to see if anything going on with my back"   Patient Stated Goals Improve mobility   Currently in Pain? No/denies                         [facility] Adult PT Treatment/Exercise - 06/27/15 0001    Bed Mobility   Supine to Sit 3: Mod assist   Sit to Supine 4: Min assist   Transfers   Transfers Lateral/Scoot Transfers   Lateral/Scoot Transfers 4: Min guard   Lateral/Scoot Transfer Details (indicate cue type and reason) used sliding board to transfer from mat back into w/chair   Lumbar Exercises: Stretches   Single Knee to Chest Stretch 2 reps;20 seconds  both legs   Lower Trunk Rotation 3 reps;30 seconds  both sides   Passive Hamstring Stretch Both;2 reps;30 seconds   Knee/Hip Exercises: Aerobic   Stationary Bike SciFit level 2.0 x 5" with UE and LE's     TherEx;  Bridging x 10 reps; knee to chest with manual mod resistance x 10 reps each leg;  Hip flexion in hooklying with  Knee flexed x 10 reps each leg   Pt requested to not attempt standing due to incr. Spasms in  LE's        PT Education - 06/27/15 0926    Education provided Yes   Education Details recommend pt to perform stretches in bed 1-2x/daily and stand as tolerated to decr. spasticity   Person(s) Educated Patient   Methods Explanation   Comprehension Verbalized understanding          PT Short Term Goals - 05/30/15 1657    Additional Short Term Goals   Additional Short Term Goals Yes   PT SHORT TERM GOAL #6   Title Transfer sit to stand with +1 mod assist (wheelchair to RW).  (06-29-15)   Baseline max assist +3 required   Time 4   Period Weeks   Status New   PT SHORT TERM GOAL #7   Title Stand for 3" at counter for LE weight-bearing with +1 min assist.  (06-29-15)   Time 4   Period Weeks   Status New   PT SHORT TERM GOAL #8    Title Amb. 10' with RW with mod assist.  (06-29-15)   Time 4   Period Weeks   Status New           PT Long Term Goals - 06/13/15 1054    PT LONG TERM GOAL  #10   TITLE Pt will transfer sit to stand from wheelchair to RW with +1 min assist.  (07-29-15)   Time 8   Period Weeks   Status New   PT LONG TERM GOAL  #11   TITLE Perform sit to stand transfer wheelchair to RW with min assist. (07-29-15)   PT LONG TERM GOAL  #12   TITLE Amb. 23' with RW with min assist for incr. household amb.  (07-29-15)   Time 8   Period Weeks   Status New               Plan - 06/27/15 3244    Clinical Impression Statement Pt reporting having incr. spasms and incr. feeling of heaviness in LLE - pt having more difficulty with transfers and moving LE's for bed mobility and supine to/from sit transfers; pt to see Dr. Christella Noa next week   Pt will benefit from skilled therapeutic intervention in order to improve on the following deficits Abnormal gait;Difficulty walking;Decreased balance;Decreased activity tolerance;Impaired tone;Decreased strength   Rehab Potential Good   PT Frequency 1x / week   PT Duration 8 weeks   PT Treatment/Interventions ADLs/Self Care Home Management;Therapeutic exercise;Balance training;Therapeutic activities;Functional mobility training;Gait training;Neuromuscular re-education;Patient/family education   PT Next Visit Plan LE stretching/ standing as tolerated   Consulted and Agree with Plan of Care Patient        Problem List Patient Active Problem List   Diagnosis Date Noted  . Peripheral edema 06/07/2015  . Hypothyroidism, postop 03/08/2015  . Secondary malignant neoplasm of vertebral column ([place]) 03/08/2015  . 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 Dysuria 09/04/2014  . Hypertension 05/01/2014  . Ingrown right big toenail 05/01/2014  . Hypokalemia 05/01/2014  . GERD (gastroesophageal reflux disease) 02/14/2014  . Dysphagia, pharyngoesophageal phase 02/06/2014  . Type  2 diabetes mellitus without complication ([place]) 03/26/7251  . Constipation   . Neurogenic bowel   . 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 ([place]) 01/20/2014  . Metastatic cancer to spine ([place]) 01/19/2014  . Rotator cuff tendonitis   . Acute postoperative respiratory failure ([place]) 01/09/2014  . 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 Leg weakness, bilateral 12/13/2013  Alda Lea, PT 06/27/2015, 9:34 AM  [facility] [address] [place], [place], [address] Phone: [phone]   Fax:  [phone]  Name: Sarah [name] MRN: [id number] Date of Birth: [birth year]

## 2015-06-30 NOTE — Addendum Note (Signed)
Addended by: Lamar Benes on: 06/30/2015 07:10 PM   Modules accepted: Orders

## 2015-07-02 ENCOUNTER — Ambulatory Visit
Admission: RE | Admit: 2015-07-02 | Discharge: 2015-07-02 | Disposition: A | Discharge status: Home / Self Care | Payer: BLUE CROSS/BLUE SHIELD | Source: Ambulatory Visit | Attending: Neurosurgery | Admitting: Neurosurgery

## 2015-07-02 ENCOUNTER — Other Ambulatory Visit: Payer: Self-pay | Admitting: Neurosurgery

## 2015-07-02 ENCOUNTER — Ambulatory Visit: Payer: BLUE CROSS/BLUE SHIELD

## 2015-07-02 DIAGNOSIS — M549 Dorsalgia, unspecified: Secondary | ICD-10-CM

## 2015-07-03 ENCOUNTER — Other Ambulatory Visit (HOSPITAL_COMMUNITY): Payer: Self-pay | Admitting: Neurosurgery

## 2015-07-03 DIAGNOSIS — C73 Malignant neoplasm of thyroid gland: Secondary | ICD-10-CM

## 2015-07-05 ENCOUNTER — Emergency Department (HOSPITAL_COMMUNITY): Payer: BLUE CROSS/BLUE SHIELD

## 2015-07-05 ENCOUNTER — Encounter (HOSPITAL_COMMUNITY)
Admission: EM | Disposition: A | Discharge status: Rehabilitation Facility | Payer: Self-pay | Source: Home / Self Care | Attending: Neurosurgery

## 2015-07-05 ENCOUNTER — Emergency Department (HOSPITAL_COMMUNITY): Payer: BLUE CROSS/BLUE SHIELD | Admitting: Anesthesiology

## 2015-07-05 ENCOUNTER — Inpatient Hospital Stay (HOSPITAL_COMMUNITY)
Admission: EM | Admit: 2015-07-05 | Discharge: 2015-07-11 | DRG: 518 | Disposition: A | Discharge status: Rehabilitation Facility | Payer: BLUE CROSS/BLUE SHIELD | Attending: Neurosurgery | Admitting: Neurosurgery

## 2015-07-05 ENCOUNTER — Encounter (HOSPITAL_COMMUNITY): Payer: Self-pay | Admitting: Emergency Medicine

## 2015-07-05 DIAGNOSIS — D492 Neoplasm of unspecified behavior of bone, soft tissue, and skin: Secondary | ICD-10-CM | POA: Diagnosis present

## 2015-07-05 DIAGNOSIS — Z7901 Long term (current) use of anticoagulants: Secondary | ICD-10-CM

## 2015-07-05 DIAGNOSIS — N179 Acute kidney failure, unspecified: Secondary | ICD-10-CM | POA: Diagnosis present

## 2015-07-05 DIAGNOSIS — C73 Malignant neoplasm of thyroid gland: Secondary | ICD-10-CM | POA: Diagnosis present

## 2015-07-05 DIAGNOSIS — E669 Obesity, unspecified: Secondary | ICD-10-CM | POA: Diagnosis present

## 2015-07-05 DIAGNOSIS — R739 Hyperglycemia, unspecified: Secondary | ICD-10-CM | POA: Diagnosis present

## 2015-07-05 DIAGNOSIS — M8448XA Pathological fracture, other site, initial encounter for fracture: Secondary | ICD-10-CM | POA: Diagnosis present

## 2015-07-05 DIAGNOSIS — K5900 Constipation, unspecified: Secondary | ICD-10-CM | POA: Diagnosis present

## 2015-07-05 DIAGNOSIS — Z886 Allergy status to analgesic agent status: Secondary | ICD-10-CM

## 2015-07-05 DIAGNOSIS — G9520 Unspecified cord compression: Secondary | ICD-10-CM | POA: Diagnosis not present

## 2015-07-05 DIAGNOSIS — C7951 Secondary malignant neoplasm of bone: Secondary | ICD-10-CM | POA: Diagnosis not present

## 2015-07-05 DIAGNOSIS — K592 Neurogenic bowel, not elsewhere classified: Secondary | ICD-10-CM | POA: Diagnosis present

## 2015-07-05 DIAGNOSIS — Z6837 Body mass index (BMI) 37.0-37.9, adult: Secondary | ICD-10-CM

## 2015-07-05 DIAGNOSIS — G8929 Other chronic pain: Secondary | ICD-10-CM | POA: Diagnosis present

## 2015-07-05 DIAGNOSIS — C799 Secondary malignant neoplasm of unspecified site: Secondary | ICD-10-CM

## 2015-07-05 DIAGNOSIS — T380X5A Adverse effect of glucocorticoids and synthetic analogues, initial encounter: Secondary | ICD-10-CM | POA: Diagnosis present

## 2015-07-05 DIAGNOSIS — Z881 Allergy status to other antibiotic agents status: Secondary | ICD-10-CM

## 2015-07-05 DIAGNOSIS — F329 Major depressive disorder, single episode, unspecified: Secondary | ICD-10-CM | POA: Diagnosis present

## 2015-07-05 DIAGNOSIS — G9340 Encephalopathy, unspecified: Secondary | ICD-10-CM | POA: Diagnosis present

## 2015-07-05 DIAGNOSIS — Z888 Allergy status to other drugs, medicaments and biological substances status: Secondary | ICD-10-CM

## 2015-07-05 DIAGNOSIS — D62 Acute posthemorrhagic anemia: Secondary | ICD-10-CM | POA: Diagnosis not present

## 2015-07-05 DIAGNOSIS — M62838 Other muscle spasm: Secondary | ICD-10-CM | POA: Diagnosis present

## 2015-07-05 DIAGNOSIS — G9529 Other cord compression: Secondary | ICD-10-CM | POA: Insufficient documentation

## 2015-07-05 DIAGNOSIS — N319 Neuromuscular dysfunction of bladder, unspecified: Secondary | ICD-10-CM | POA: Diagnosis present

## 2015-07-05 DIAGNOSIS — R32 Unspecified urinary incontinence: Secondary | ICD-10-CM | POA: Diagnosis present

## 2015-07-05 DIAGNOSIS — J9690 Respiratory failure, unspecified, unspecified whether with hypoxia or hypercapnia: Secondary | ICD-10-CM | POA: Insufficient documentation

## 2015-07-05 DIAGNOSIS — K219 Gastro-esophageal reflux disease without esophagitis: Secondary | ICD-10-CM | POA: Diagnosis present

## 2015-07-05 DIAGNOSIS — G8220 Paraplegia, unspecified: Secondary | ICD-10-CM | POA: Diagnosis present

## 2015-07-05 DIAGNOSIS — Z01818 Encounter for other preprocedural examination: Secondary | ICD-10-CM

## 2015-07-05 DIAGNOSIS — R197 Diarrhea, unspecified: Secondary | ICD-10-CM

## 2015-07-05 DIAGNOSIS — Z86711 Personal history of pulmonary embolism: Secondary | ICD-10-CM

## 2015-07-05 DIAGNOSIS — I10 Essential (primary) hypertension: Secondary | ICD-10-CM | POA: Diagnosis present

## 2015-07-05 DIAGNOSIS — C7800 Secondary malignant neoplasm of unspecified lung: Secondary | ICD-10-CM | POA: Diagnosis present

## 2015-07-05 DIAGNOSIS — Z981 Arthrodesis status: Secondary | ICD-10-CM

## 2015-07-05 DIAGNOSIS — E890 Postprocedural hypothyroidism: Secondary | ICD-10-CM | POA: Insufficient documentation

## 2015-07-05 DIAGNOSIS — Z79899 Other long term (current) drug therapy: Secondary | ICD-10-CM

## 2015-07-05 DIAGNOSIS — R29898 Other symptoms and signs involving the musculoskeletal system: Secondary | ICD-10-CM | POA: Insufficient documentation

## 2015-07-05 DIAGNOSIS — Z923 Personal history of irradiation: Secondary | ICD-10-CM

## 2015-07-05 HISTORY — DX: Other pulmonary embolism without acute cor pulmonale: I26.99

## 2015-07-05 HISTORY — DX: Heparin induced thrombocytopenia (HIT): D75.82

## 2015-07-05 HISTORY — PX: LAMINECTOMY: SHX219

## 2015-07-05 HISTORY — DX: Heparin-induced thrombocytopenia, unspecified: D75.829

## 2015-07-05 LAB — URINALYSIS, ROUTINE W REFLEX MICROSCOPIC
Bilirubin Urine: NEGATIVE
Glucose, UA: NEGATIVE mg/dL
Hgb urine dipstick: NEGATIVE
KETONES UR: NEGATIVE mg/dL
LEUKOCYTES UA: NEGATIVE
NITRITE: NEGATIVE
PH: 7 (ref 5.0–8.0)
Protein, ur: NEGATIVE mg/dL
Specific Gravity, Urine: 1.018 (ref 1.005–1.030)

## 2015-07-05 LAB — PREPARE RBC (CROSSMATCH)

## 2015-07-05 LAB — CBC WITH DIFFERENTIAL/PLATELET
Basophils Absolute: 0 10*3/uL (ref 0.0–0.1)
Basophils Relative: 0 %
EOS ABS: 0.1 10*3/uL (ref 0.0–0.7)
EOS PCT: 2 %
HCT: 30.9 % — ABNORMAL LOW (ref 36.0–46.0)
Hemoglobin: 10.2 g/dL — ABNORMAL LOW (ref 12.0–15.0)
LYMPHS ABS: 1.1 10*3/uL (ref 0.7–4.0)
Lymphocytes Relative: 24 %
MCH: 28.7 pg (ref 26.0–34.0)
MCHC: 33 g/dL (ref 30.0–36.0)
MCV: 86.8 fL (ref 78.0–100.0)
MONOS PCT: 7 %
Monocytes Absolute: 0.3 10*3/uL (ref 0.1–1.0)
Neutro Abs: 3.3 10*3/uL (ref 1.7–7.7)
Neutrophils Relative %: 67 %
PLATELETS: 203 10*3/uL (ref 150–400)
RBC: 3.56 MIL/uL — ABNORMAL LOW (ref 3.87–5.11)
RDW: 13.2 % (ref 11.5–15.5)
WBC: 4.8 10*3/uL (ref 4.0–10.5)

## 2015-07-05 LAB — COMPREHENSIVE METABOLIC PANEL
ALK PHOS: 94 U/L (ref 38–126)
ALT: 11 U/L — ABNORMAL LOW (ref 14–54)
ANION GAP: 7 (ref 5–15)
AST: 17 U/L (ref 15–41)
Albumin: 3.4 g/dL — ABNORMAL LOW (ref 3.5–5.0)
BUN: 15 mg/dL (ref 6–20)
CALCIUM: 8.5 mg/dL — AB (ref 8.9–10.3)
CHLORIDE: 106 mmol/L (ref 101–111)
CO2: 26 mmol/L (ref 22–32)
Creatinine, Ser: 1.06 mg/dL — ABNORMAL HIGH (ref 0.44–1.00)
GFR calc non Af Amer: >60 mL/min (ref >60)
Glucose, Bld: 96 mg/dL (ref 65–99)
Potassium: 4.1 mmol/L (ref 3.5–5.1)
SODIUM: 139 mmol/L (ref 135–145)
Total Bilirubin: 1.3 mg/dL — ABNORMAL HIGH (ref 0.3–1.2)
Total Protein: 6.5 g/dL (ref 6.5–8.1)

## 2015-07-05 LAB — PROTIME-INR
INR: 1.45 (ref 0.00–1.49)
PROTHROMBIN TIME: 17.7 s — AB (ref 11.6–15.2)

## 2015-07-05 LAB — APTT: APTT: 37 s (ref 24–37)

## 2015-07-05 SURGERY — THORACIC LAMINECTOMY FOR TUMOR
Anesthesia: General | Site: Back

## 2015-07-05 MED ORDER — DEXAMETHASONE SODIUM PHOSPHATE 10 MG/ML IJ SOLN
INTRAMUSCULAR | Status: DC | PRN
  Administered 2015-07-05: 10 mg via INTRAVENOUS

## 2015-07-05 MED ORDER — THROMBIN 20000 UNITS EX SOLR
CUTANEOUS | Status: DC | PRN
  Administered 2015-07-05: 20 mL via TOPICAL

## 2015-07-05 MED ORDER — SODIUM CHLORIDE 0.9 % IV SOLN: 10.0000 mL/h | Freq: Once | INTRAVENOUS | Status: DC

## 2015-07-05 MED ORDER — DEXAMETHASONE SODIUM PHOSPHATE 10 MG/ML IJ SOLN
10.0000 mg | Freq: Once | INTRAMUSCULAR | Status: AC
  Administered 2015-07-05: 10 mg via INTRAVENOUS
  Filled 2015-07-05: qty 1

## 2015-07-05 MED ORDER — THROMBIN 5000 UNITS EX SOLR
OROMUCOSAL | Status: DC | PRN
  Administered 2015-07-05 – 2015-07-06 (×2): 5 mL via TOPICAL

## 2015-07-05 MED ORDER — SUFENTANIL CITRATE 50 MCG/ML IV SOLN
INTRAVENOUS | Status: DC | PRN
  Administered 2015-07-05: 5 ug via INTRAVENOUS
  Administered 2015-07-05 (×2): 10 ug via INTRAVENOUS
  Administered 2015-07-05: 5 ug via INTRAVENOUS
  Administered 2015-07-05 – 2015-07-06 (×3): 10 ug via INTRAVENOUS

## 2015-07-05 MED ORDER — MIDAZOLAM HCL 5 MG/5ML IJ SOLN
INTRAMUSCULAR | Status: DC | PRN
  Administered 2015-07-05 – 2015-07-06 (×2): 2 mg via INTRAVENOUS

## 2015-07-05 MED ORDER — MORPHINE SULFATE (PF) 4 MG/ML IV SOLN
4.0000 mg | Freq: Once | INTRAVENOUS | Status: AC
  Administered 2015-07-05: 4 mg via INTRAVENOUS
  Filled 2015-07-05: qty 1

## 2015-07-05 MED ORDER — PROPOFOL 10 MG/ML IV BOLUS
INTRAVENOUS | Status: DC | PRN
  Administered 2015-07-05: 150 mg via INTRAVENOUS

## 2015-07-05 MED ORDER — 0.9 % SODIUM CHLORIDE (POUR BTL) OPTIME
TOPICAL | Status: DC | PRN
  Administered 2015-07-05: 1000 mL

## 2015-07-05 MED ORDER — ROCURONIUM BROMIDE 100 MG/10ML IV SOLN
INTRAVENOUS | Status: DC | PRN
  Administered 2015-07-05: 50 mg via INTRAVENOUS
  Administered 2015-07-05: 20 mg via INTRAVENOUS
  Administered 2015-07-06: 30 mg via INTRAVENOUS

## 2015-07-05 MED ORDER — LORAZEPAM 2 MG/ML IJ SOLN
1.0000 mg | Freq: Once | INTRAMUSCULAR | Status: AC
  Administered 2015-07-05: 1 mg via INTRAVENOUS
  Filled 2015-07-05: qty 1

## 2015-07-05 MED ORDER — SODIUM CHLORIDE 0.9 % IV SOLN
Freq: Once | INTRAVENOUS | Status: AC
  Administered 2015-07-06: via INTRAVENOUS

## 2015-07-05 MED ORDER — GADOBENATE DIMEGLUMINE 529 MG/ML IV SOLN
20.0000 mL | Freq: Once | INTRAVENOUS | Status: AC | PRN
  Administered 2015-07-05: 20 mL via INTRAVENOUS

## 2015-07-05 MED ORDER — VANCOMYCIN HCL 1000 MG IV SOLR
1000.0000 mg | INTRAVENOUS | Status: DC | PRN
  Administered 2015-07-05 (×2): 1000 mg via INTRAVENOUS

## 2015-07-05 MED ORDER — SUCCINYLCHOLINE CHLORIDE 20 MG/ML IJ SOLN
INTRAMUSCULAR | Status: DC | PRN
  Administered 2015-07-05: 120 mg via INTRAVENOUS

## 2015-07-05 MED ORDER — LIDOCAINE HCL (CARDIAC) 20 MG/ML IV SOLN
INTRAVENOUS | Status: DC | PRN
  Administered 2015-07-05: 100 mg via INTRAVENOUS

## 2015-07-05 MED ORDER — LACTATED RINGERS IV SOLN
INTRAVENOUS | Status: DC | PRN
  Administered 2015-07-05: 23:00:00 via INTRAVENOUS

## 2015-07-05 SURGICAL SUPPLY — 70 items
BAG DECANTER FOR FLEXI CONT (MISCELLANEOUS) ×3 IMPLANT
BLADE CLIPPER SURG (BLADE) IMPLANT
BLADE SURG 11 STRL SS (BLADE) IMPLANT
BUR MATCHSTICK NEURO 3.0 LAGG (BURR) IMPLANT
BUR ROUND FLUTED 4 SOFT TCH (BURR) ×2 IMPLANT
BUR ROUND FLUTED 4MM SOFT TCH (BURR) ×1
CANISTER SUCT 3000ML PPV (MISCELLANEOUS) ×3 IMPLANT
DRAPE LAPAROTOMY 100X72X124 (DRAPES) ×3 IMPLANT
DRAPE LAPAROTOMY T 102X78X121 (DRAPES) IMPLANT
DRAPE MICROSCOPE LEICA (MISCELLANEOUS) ×3 IMPLANT
DRAPE POUCH INSTRU U-SHP 10X18 (DRAPES) ×3 IMPLANT
DRAPE SURG 17X23 STRL (DRAPES) ×6 IMPLANT
DRSG OPSITE POSTOP 4X10 (GAUZE/BANDAGES/DRESSINGS) ×3 IMPLANT
DURASEAL APPLICATOR TIP (TIP) ×3 IMPLANT
DURASEAL SPINE SEALANT 3ML (MISCELLANEOUS) ×3 IMPLANT
ELECT REM PT RETURN 9FT ADLT (ELECTROSURGICAL) ×3
ELECTRODE REM PT RTRN 9FT ADLT (ELECTROSURGICAL) ×1 IMPLANT
GAUZE SPONGE 4X4 12PLY STRL (GAUZE/BANDAGES/DRESSINGS) ×3 IMPLANT
GAUZE SPONGE 4X4 16PLY XRAY LF (GAUZE/BANDAGES/DRESSINGS) IMPLANT
GLOVE BIO SURGEON STRL SZ 6.5 (GLOVE) ×2 IMPLANT
GLOVE BIO SURGEON STRL SZ7 (GLOVE) ×6 IMPLANT
GLOVE BIO SURGEON STRL SZ8 (GLOVE) ×3 IMPLANT
GLOVE BIO SURGEONS STRL SZ 6.5 (GLOVE) ×1
GLOVE BIOGEL PI IND STRL 6.5 (GLOVE) ×1 IMPLANT
GLOVE BIOGEL PI IND STRL 7.5 (GLOVE) ×1 IMPLANT
GLOVE BIOGEL PI INDICATOR 6.5 (GLOVE) ×2
GLOVE BIOGEL PI INDICATOR 7.5 (GLOVE) ×2
GLOVE ECLIPSE 6.5 STRL STRAW (GLOVE) ×3 IMPLANT
GLOVE EXAM NITRILE LRG STRL (GLOVE) IMPLANT
GLOVE EXAM NITRILE MD LF STRL (GLOVE) IMPLANT
GLOVE EXAM NITRILE XL STR (GLOVE) IMPLANT
GLOVE EXAM NITRILE XS STR PU (GLOVE) IMPLANT
GOWN STRL REUS W/ TWL LRG LVL3 (GOWN DISPOSABLE) ×4 IMPLANT
GOWN STRL REUS W/ TWL XL LVL3 (GOWN DISPOSABLE) ×1 IMPLANT
GOWN STRL REUS W/TWL 2XL LVL3 (GOWN DISPOSABLE) IMPLANT
GOWN STRL REUS W/TWL LRG LVL3 (GOWN DISPOSABLE) ×8
GOWN STRL REUS W/TWL XL LVL3 (GOWN DISPOSABLE) ×2
GRAFT DURAGEN MATRIX 1WX1L (Tissue) ×3 IMPLANT
HEMOSTAT SURGICEL 2X14 (HEMOSTASIS) IMPLANT
KIT BASIN OR (CUSTOM PROCEDURE TRAY) ×3 IMPLANT
KIT ROOM TURNOVER OR (KITS) ×3 IMPLANT
LIQUID BAND (GAUZE/BANDAGES/DRESSINGS) ×3 IMPLANT
NEEDLE HYPO 22GX1.5 SAFETY (NEEDLE) ×3 IMPLANT
NEEDLE SPNL 20GX3.5 QUINCKE YW (NEEDLE) IMPLANT
NS IRRIG 1000ML POUR BTL (IV SOLUTION) ×3 IMPLANT
PACK LAMINECTOMY NEURO (CUSTOM PROCEDURE TRAY) ×3 IMPLANT
PAD EYE OVAL STERILE LF (GAUZE/BANDAGES/DRESSINGS) IMPLANT
PATTIES SURGICAL .5 X.5 (GAUZE/BANDAGES/DRESSINGS) IMPLANT
PATTIES SURGICAL .5 X3 (DISPOSABLE) ×3 IMPLANT
RUBBERBAND STERILE (MISCELLANEOUS) ×6 IMPLANT
SPONGE LAP 4X18 X RAY DECT (DISPOSABLE) IMPLANT
SPONGE SURGIFOAM ABS GEL 100 (HEMOSTASIS) IMPLANT
STAPLER VISISTAT 35W (STAPLE) ×3 IMPLANT
SUT BONE WAX W31G (SUTURE) IMPLANT
SUT ETHILON 3 0 FSL (SUTURE) ×3 IMPLANT
SUT ETHILON 4 0 PS 2 18 (SUTURE) ×3 IMPLANT
SUT NURALON 4 0 TR CR/8 (SUTURE) IMPLANT
SUT PROLENE 6 0 BV (SUTURE) IMPLANT
SUT STRATAFIX 1PDS 45CM VIOLET (SUTURE) ×3 IMPLANT
SUT VIC AB 0 CT1 18XCR BRD8 (SUTURE) IMPLANT
SUT VIC AB 0 CT1 8-18 (SUTURE)
SUT VIC AB 2-0 CT1 18 (SUTURE) ×3 IMPLANT
SUT VIC AB 3-0 SH 8-18 (SUTURE) IMPLANT
SUT VICRYL 4-0 PS2 18IN ABS (SUTURE) IMPLANT
TOWEL OR 17X24 6PK STRL BLUE (TOWEL DISPOSABLE) ×3 IMPLANT
TOWEL OR 17X26 10 PK STRL BLUE (TOWEL DISPOSABLE) ×3 IMPLANT
TRAY FOLEY W/METER SILVER 14FR (SET/KITS/TRAYS/PACK) IMPLANT
TUBE CONNECTING 12'X1/4 (SUCTIONS)
TUBE CONNECTING 12X1/4 (SUCTIONS) IMPLANT
WATER STERILE IRR 1000ML POUR (IV SOLUTION) ×3 IMPLANT

## 2015-07-05 NOTE — Anesthesia Preprocedure Evaluation (Addendum)
Anesthesia Evaluation  Patient identified by MRN, date of birth, ID band Patient awake    Reviewed: Allergy & Precautions, NPO status , Patient's Chart, lab work & pertinent test results  Airway Mallampati: II  TM Distance: >3 FB Neck ROM: Full    Dental  (+) Poor Dentition, Loose, Teeth Intact,    Pulmonary    breath sounds clear to auscultation       Cardiovascular hypertension, Pt. on medications  Rhythm:Regular Rate:Normal     Neuro/Psych    GI/Hepatic GERD  Medicated and Controlled,  Endo/Other  Hypothyroidism Morbid obesity  Renal/GU      Musculoskeletal   Abdominal   Peds  Hematology   Anesthesia Other Findings   Reproductive/Obstetrics                           Anesthesia Physical Anesthesia Plan  ASA: IV and emergent  Anesthesia Plan: General   Post-op Pain Management:    Induction: Intravenous  Airway Management Planned: Oral ETT  Additional Equipment:   Intra-op Plan:   Post-operative Plan: Extubation in OR  Informed Consent: I have reviewed the patients History and Physical, chart, labs and discussed the procedure including the risks, benefits and alternatives for the proposed anesthesia with the patient or authorized representative who has indicated his/her understanding and acceptance.   Dental advisory given  Plan Discussed with: CRNA, Anesthesiologist and Surgeon  Anesthesia Plan Comments: (Pt warned she may lose at least the loose tooth. She understands.)        Anesthesia Quick Evaluation

## 2015-07-05 NOTE — ED Notes (Signed)
Patient here with complaints of bilateral leg weakness. Reports going to PCP on Monday, MRI scheduled for next Thursday. Pain 8/10

## 2015-07-05 NOTE — ED Notes (Signed)
Attempt to give report to [facility] in PACU at [facility], She states she didn't need to take report from this writer, would get report from [facility] when they/ patient arrive to PACU.

## 2015-07-05 NOTE — ED Notes (Signed)
Patient transported to MRI 

## 2015-07-05 NOTE — Anesthesia Procedure Notes (Signed)
Procedure Name: Intubation Date/Time: 07/05/2015 10:59 PM Performed by: Aahana Elza S Pre-anesthesia Checklist: Patient identified, Timeout performed, Emergency Drugs available, Suction available and Patient being monitored Patient Re-evaluated:Patient Re-evaluated prior to inductionOxygen Delivery Method: Circle system utilized Preoxygenation: Pre-oxygenation with 100% oxygen Intubation Type: IV induction Ventilation: Mask ventilation without difficulty Laryngoscope Size: Mac and 3 Grade View: Grade I Tube type: Subglottic suction tube Tube size: 7.5 mm Number of attempts: 1 Airway Equipment and Method: Stylet Placement Confirmation: ETT inserted through vocal cords under direct vision,  positive ETCO2 and breath sounds checked- equal and bilateral Secured at: 22 cm Tube secured with: Tape Dental Injury: Teeth and Oropharynx as per pre-operative assessment

## 2015-07-05 NOTE — ED Provider Notes (Signed)
CSN: [id number]     Arrival date & time 07/05/15  1302 History   First MD Initiated Contact with Patient 07/05/15 1332     Chief Complaint  Patient presents with  . 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 Patient states that about 3 weeks ago she was at her physical therapy when she bent over and heard something pop in her mid back. Since then she has had progressively worsening bilateral lower extremity weakness. Patient saw her PCP for this issue last week who ordered an MRI of her thoracic spine. However, the imaging is unable to be performed until next week. This morning when patient woke up she states she was unable to even put her shoes on and she woke up soiled in her own feces and urine. She states that she has lost control of her bowels and bladder. Patient does report back pain but states that it is very mild. Of note, patient had complete resection of her spinal metastases and thyroid cancer in 2015 she underwent radiation when she has since completed. She is being followed by [facility] oncology who states that her cancer and those locations are stable. She is not currently receiving treatment for cancer. 2 years ago when the T3 spinal lesion was found patient became paraplegic from this. She has been undergoing physical therapy ever since and was significantly improving, able to ambulate on her own. This has drastically declined in the last 3 weeks and patient is now not able to walk or put on her own shoes. She denies weight loss, fever, abdominal pain.   Past Medical History  Diagnosis Date  . Hypertension   . DDD (degenerative disc disease), cervical   . DJD (degenerative joint disease)   .  Obesity   . Chronic back pain   . 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 History of rectal fissure   . Rotator cuff tendonitis right   Past Surgical History  Procedure Laterality Date  . Tonsillectomy    . Laminectomy N/A 12/14/2013    Procedure: THORACIC LAMINECTOMY FOR TUMOR THORACIC THREE;  Surgeon: Ashok Pall, MD;  Location: [facility];  Service: Neurosurgery;  Laterality: N/A;  THORACIC LAMINECTOMY FOR TUMOR THORACIC THREE  . Posterior lumbar fusion 4 level N/A 12/30/2013    Procedure: Thoracic one-Thoracic five posterior thoracic fusion with pedicle screws;  Surgeon: Ashok Pall, MD;  Location: [facility];  Service: Neurosurgery;  Laterality: N/A;  Thoracic one-Thoracic five posterior thoracic fusion with pedicle screws  . Thyroidectomy N/A 01/09/2014    Procedure: TOTAL THYROIDECTOMY;  Surgeon: Izora Gala, MD;  [facility]: [facility];  Service: ENT;  Laterality: N/A;   Family History  Problem Relation Age of Onset  . Hypertension Mother   . Diabetes Mother   . Hypertension Father   . Stroke Father    Social History  Substance Use Topics  . Smoking status: Never Smoker   . Smokeless tobacco: Never Used  . Alcohol Use: No   OB History    No data available     Review of Systems  All other systems reviewed and are negative.     Allergies  Keflex; Tramadol; and Hydrocodone  Home Medications   Prior  to Admission medications   Medication Sig Start Date End Date Taking? Authorizing Provider  baclofen (LIORESAL) 20 MG tablet Take 1 tablet (20 mg total) by mouth 3 (three) times daily. 03/27/15  Yes [name], MD  ibuprofen (ADVIL,MOTRIN) 200 MG tablet Take 400 mg by mouth every 6 (six) hours as needed for headache or moderate pain.   Yes Historical Provider, MD  KLOR-CON M10 10 MEQ tablet TAKE ONE TABLET BY MOUTH ONCE DAILY 03/19/15  Yes Elberta Leatherwood, MD  levothyroxine (SYNTHROID, LEVOTHROID) 200 MCG tablet Take 200 mcg by  mouth daily before breakfast.   Yes Historical Provider, MD  levothyroxine (SYNTHROID, LEVOTHROID) 75 MCG tablet Take by mouth. 03/15/15 03/14/16 Yes Historical Provider, MD  lisinopril (PRINIVIL,ZESTRIL) 10 MG tablet Take 1 tablet (10 mg total) by mouth daily. 06/07/15  Yes Elberta Leatherwood, MD  ondansetron (ZOFRAN) 4 MG tablet Take 1 tablet (4 mg total) by mouth every 6 (six) hours. 06/07/15  Yes Elberta Leatherwood, MD  pantoprazole (PROTONIX) 40 MG tablet TAKE ONE TABLET BY MOUTH ONCE DAILY 01/17/15  Yes Elberta Leatherwood, MD  rivaroxaban (XARELTO) 20 MG TABS tablet Take 1 tablet (20 mg total) by mouth daily. 09/19/14  Yes [name], MD  sertraline (ZOLOFT) 25 MG tablet Take 1 tablet (25 mg total) by mouth daily. 02/02/15  Yes Elberta Leatherwood, MD  zinc oxide (BALMEX) 11.3 % CREA cream Apply 1 application topically daily as needed. Apply to rectum   Yes Historical Provider, MD   BP 129/76 mmHg  Pulse 82  Temp(Src) 99 F (37.2 C) (Oral)  Resp 18  SpO2 96% Physical Exam  Constitutional: She is oriented to person, place, and time. She appears well-developed and well-nourished. No distress.  HENT:  Head: Normocephalic and atraumatic.  Mouth/Throat: No oropharyngeal exudate.  Eyes: Conjunctivae and EOM are normal. Pupils are equal, round, and reactive to light. Right eye exhibits no discharge. Left eye exhibits no discharge. No scleral icterus.  Cardiovascular: Normal rate, regular rhythm, normal heart sounds and intact distal pulses.  Exam reveals no gallop and no friction rub.   No murmur heard. Pulmonary/Chest: Effort normal and breath sounds normal. No respiratory distress. She has no wheezes. She has no rales. She exhibits no tenderness.  Abdominal: Soft. She exhibits no distension. There is no tenderness. There is no guarding.  Musculoskeletal: Normal range of motion. She exhibits no edema.  Mild thoracic midline spinal tenderness. FROM of C, T, L, spine. No step offs or obvious bony deformities.   Neurological: She is alert and oriented to person, place, and time. No cranial nerve deficit.  Strength 5 out of 5 in bilateral upper extremity. Minimal sphincter tone. Strength 1 out of 5 in bilateral lower extremities, unable to lift either leg. Able to wiggle toes. Diminished sensitivity bilaterally, but still present. Absent patellar reflexes.  Skin: Skin is warm and dry. No rash noted. She is not diaphoretic. No erythema. No pallor.  Psychiatric: She has a normal mood and affect. Her behavior is normal.  Nursing note and vitals reviewed.   ED Course  Procedures (including critical care time) Labs Review Labs Reviewed - No data to display  Imaging Review No results found. I have personally reviewed and evaluated these images and lab results as part of my medical decision-making.   EKG Interpretation None      MDM   Final diagnoses:  Lower extremity weakness  Lower extremity weakness   Pt with hx of thyroid  Ca with mets to T3 s/p resection presents with progressively worsening bilateral LE weakness over the last 3 weeks. Pt has true weakness on exam with poor sphincter tone and bowel incontinence. Concern for spinal cord lesion vs cauda equina. Will obtain MRI T and L. Pain managed in ED. Pt signed out to Domenic Moras PA-C at shift change pending imaging. If this is positive will need to consult neurosurgery. Pt previous surgery was performed by Dr. Christella Noa.      Dondra Spry [place], PA-C 07/05/15 1616  Leo Grosser, MD 07/05/15 2037

## 2015-07-05 NOTE — H&P (Signed)
Sarah [name] is a 50 yo woman whom I know well. She saw me in the office this past Monday. She at that time reported increasing weakness in the lower extremities, and loss of progress recently made with her ability to walk, stand. I ordered an MRI, and it was scheduled for next week. She today was unable to stand, walk, and was incontinent of urine and stool. Sarah [name] came to the ed for further evaluation this am.  Repeat MRI revealed tumor recurrence at T3 causing spinal cord compression. I was called tonight while in the operating room.  Allergies  Allergen Reactions  . Keflex [Cephalexin] Nausea And Vomiting  . Tramadol Nausea And Vomiting  . Hydrocodone Nausea And Vomiting   Prior to Admission medications   Medication Sig Start Date End Date Taking? Authorizing Provider  baclofen (LIORESAL) 20 MG tablet Take 1 tablet (20 mg total) by mouth 3 (three) times daily. 03/27/15  Yes [name], MD  ibuprofen (ADVIL,MOTRIN) 200 MG tablet Take 400 mg by mouth every 6 (six) hours as needed for headache or moderate pain.   Yes Historical Provider, MD  KLOR-CON M10 10 MEQ tablet TAKE ONE TABLET BY MOUTH ONCE DAILY 03/19/15  Yes Elberta Leatherwood, MD  levothyroxine (SYNTHROID, LEVOTHROID) 200 MCG tablet Take 200 mcg by mouth daily before breakfast.   Yes Historical Provider, MD  levothyroxine (SYNTHROID, LEVOTHROID) 75 MCG tablet Take by mouth. 03/15/15 03/14/16 Yes Historical Provider, MD  lisinopril (PRINIVIL,ZESTRIL) 10 MG tablet Take 1 tablet (10 mg total) by mouth daily. 06/07/15  Yes Elberta Leatherwood, MD  ondansetron (ZOFRAN) 4 MG tablet Take 1 tablet (4 mg total) by mouth every 6 (six) hours. 06/07/15  Yes Elberta Leatherwood, MD  pantoprazole (PROTONIX) 40 MG tablet TAKE ONE TABLET BY MOUTH ONCE DAILY 01/17/15  Yes Elberta Leatherwood, MD  rivaroxaban (XARELTO) 20 MG TABS tablet Take 1 tablet (20 mg total) by mouth daily. 09/19/14  Yes [name], MD  sertraline (ZOLOFT) 25 MG tablet Take 1 tablet (25 mg total)  by mouth daily. 02/02/15  Yes Elberta Leatherwood, MD  zinc oxide (BALMEX) 11.3 % CREA cream Apply 1 application topically daily as needed. Apply to rectum   Yes Historical Provider, MD   Past Surgical History  Procedure Laterality Date  . Tonsillectomy    . Laminectomy N/A 12/14/2013    Procedure: THORACIC LAMINECTOMY FOR TUMOR THORACIC THREE;  Surgeon: Ashok Pall, MD;  Location: [place] [facility];  Service: Neurosurgery;  Laterality: N/A;  THORACIC LAMINECTOMY FOR TUMOR THORACIC THREE  . Posterior lumbar fusion 4 level N/A 12/30/2013    Procedure: Thoracic one-Thoracic five posterior thoracic fusion with pedicle screws;  Surgeon: Ashok Pall, MD;  Location: [place] [facility];  Service: Neurosurgery;  Laterality: N/A;  Thoracic one-Thoracic five posterior thoracic fusion with pedicle screws  . Thyroidectomy N/A 01/09/2014    Procedure: TOTAL THYROIDECTOMY;  Surgeon: Izora Gala, MD;  Location: [facility] OR;  Service: ENT;  Laterality: N/A;   Past Medical History  Diagnosis Date  . Hypertension   . DDD (degenerative disc disease), cervical   . DJD (degenerative joint disease)   . Obesity   . Chronic back pain   . 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 History of rectal fissure   . Rotator cuff tendonitis right   Family History  Problem Relation Age of Onset  . Hypertension Mother   . Diabetes Mother   .  Hypertension Father   . Stroke Father    Social History   Social History  . Marital Status: Single    Spouse Name: N/A  . Number of Children: N/A  . Years of Education: N/A   Occupational History  . Not on file.   Social History Main Topics  . Smoking status: Never Smoker   . Smokeless tobacco: Never Used  . Alcohol Use: No  . Drug Use: No  . Sexual Activity: Not Currently   Other Topics Concern  . Not on file   Social History Narrative   Review of Systems  HENT: Negative.   Eyes: Negative.   Respiratory: Negative.   Cardiovascular:  Negative.   Gastrointestinal: Negative.   Genitourinary: Negative.   Musculoskeletal: Negative.   Skin: Negative.   Neurological: Positive for focal weakness and weakness.  Endo/Heme/Allergies: Negative.   Psychiatric/Behavioral: Negative.   Physical Exam  Constitutional: She is oriented to person, place, and time. She appears well-developed and well-nourished. She appears distressed.  HENT:  Head: Normocephalic and atraumatic.  Eyes: Conjunctivae and EOM are normal. Pupils are equal, round, and reactive to light.  Neck: Normal range of motion. Neck supple.  Cardiovascular: Normal rate, regular rhythm, normal heart sounds and intact distal pulses.   Pulmonary/Chest: Effort normal and breath sounds normal.  Abdominal: Soft.  Musculoskeletal:  Unable to move lower extremities  Neurological: She is alert and oriented to person, place, and time. She displays abnormal reflex. A sensory deficit is present. No cranial nerve deficit. She exhibits abnormal muscle tone. She displays no Babinski's sign on the right side. She displays no Babinski's sign on the left side.  1/5 lower extremities Gait not assessed  Mr Thoracic Spine W Wo Contrast  07/05/2015  ADDENDUM REPORT: 07/05/2015 18:58 ADDENDUM: Critical Value/emergent results were called by telephone at the time of interpretation on 07/05/2015 at 1711 hours to ER provider Alvie Heidelberg, [employer], who verbally acknowledged these results. Electronically Signed   By: Genevie Ann M.D.   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 T3 pathologic fracture treated with surgery including fusion from T1-T5. Worsening lower extremity weakness for 3 weeks. Subsequent encounter. EXAM: MRI THORACIC AND LUMBAR SPINE WITHOUT AND WITH CONTRAST TECHNIQUE: Multiplanar and multiecho pulse sequences of the thoracic and lumbar spine were obtained without and with intravenous contrast. CONTRAST:  28m  MULTIHANCE GADOBENATE DIMEGLUMINE 529 MG/ML IV SOLN COMPARISON:  Thoracic spine MRI 12/14/2013. PET-CT 12/22/2014. Lumbar MRI 12/10/2013. Cervical spine MRI 12/14/2013 FINDINGS: MR THORACIC SPINE FINDINGS Limited sagittal imaging of the cervical spine today appears grossly stable to that in 2015. Pathologic T3 compression fracture re- demonstrated with bulky epidural tumor resulting in moderate to severe cord compression (series 8, image 9). This epidural tumor is chronic but progressed compared to 2015 (series 1000, image 8 on 12/14/2013). Evidence of associated cord edema just below the maximal compressive level at T3-T4 (series 8, image 13 and series 7, image 8). Hardware susceptibility artifact in this region, no abnormal spinal cord or intradural enhancement identified. Excluding T12 (see below) no other thoracic vertebral metastatic disease. Stable thoracic vertebral height and alignment elsewhere. No posterior rib metastasis identified. Chronic but increased thoracic epidural lipomatosis. When combined with a chronic degenerative T7-T8 disc herniation (series 8, image 23) there is increased, now mild to moderate degenerative spinal stenosis without associated cord signal abnormality. Smaller chronic left paracentral disc protrusion at T8-T9. No other thoracic spinal stenosis. The  conus medullaris appears normal at T12-L1. Posterior paraspinal soft tissues are within normal limits. MR LUMBAR SPINE FINDINGS Normal lumbar segmentation, concordant with the thoracic numbering. New decreased T1 marrow signal with enhancement in the anterior T12, L1, L2 and L3 vertebrae. Similar findings at the anterior superior S1 vertebral body. There does not appear to be associated interval disc degeneration at these levels to explain the marrow signal changes on the basis of degenerative etiology. Visible sacral ala and medial iliac bones remain normal. No associated pathologic fracture. Visualized lower thoracic spinal cord is  normal with conus medularis at L1-L2. No abnormal intradural enhancement. Negative visualized abdominal viscera. Increased lumbar epidural lipomatosis in conjunction with progressed L3 through S1 disc, endplate, and facet degeneration. Subsequent new moderate to severe degenerative etiology spinal stenosis at: L3-L4 (series 16, image 22), L4-L5 (series 16, image 27), and L5-S1 (greater on the left, series 16, image 33). There is degenerative appearing mild soft tissue inflammation about the left L4-L5 facet joint. IMPRESSION: 1. Chronic T3 pathologic fracture with chronic but increased bulky epidural tumor and progressed Severe Malignant Cord Compression. Associated mild spinal cord edema. 2. New abnormal marrow signal along the anterior vertebral bodies T12 through L3 most compatible with new osseous metastatic disease. Similar finding in the S1 vertebra. No associated pathologic fracture or epidural tumor at these levels. 3. No other thoracic metastatic disease identified. 4. Superimposed progressed degenerative mid thoracic and lower lumbar spinal stenosis related to disc disease, epidural lipomatosis, and posterior element degeneration. Electronically Signed: By: Genevie Ann M.D. On: 07/05/2015 17:08   Mr Lumbar Spine W Wo Contrast  07/05/2015  ADDENDUM REPORT: 07/05/2015 18:58 ADDENDUM: Critical Value/emergent results were called by telephone at the time of interpretation on 07/05/2015 at 1711 hours to ER provider Alvie Heidelberg, [facility], who verbally acknowledged these results. Electronically Signed   By: Genevie Ann M.D.   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 T3 pathologic fracture treated with surgery including fusion from T1-T5. Worsening lower extremity weakness for 3 weeks. Subsequent encounter. EXAM: MRI THORACIC AND LUMBAR SPINE WITHOUT AND WITH CONTRAST TECHNIQUE: Multiplanar and multiecho pulse sequences of the thoracic and  lumbar spine were obtained without and with intravenous contrast. CONTRAST:  35m MULTIHANCE GADOBENATE DIMEGLUMINE 529 MG/ML IV SOLN COMPARISON:  Thoracic spine MRI 12/14/2013. PET-CT 12/22/2014. Lumbar MRI 12/10/2013. Cervical spine MRI 12/14/2013 FINDINGS: MR THORACIC SPINE FINDINGS Limited sagittal imaging of the cervical spine today appears grossly stable to that in 2015. Pathologic T3 compression fracture re- demonstrated with bulky epidural tumor resulting in moderate to severe cord compression (series 8, image 9). This epidural tumor is chronic but progressed compared to 2015 (series 1000, image 8 on 12/14/2013). Evidence of associated cord edema just below the maximal compressive level at T3-T4 (series 8, image 13 and series 7, image 8). Hardware susceptibility artifact in this region, no abnormal spinal cord or intradural enhancement identified. Excluding T12 (see below) no other thoracic vertebral metastatic disease. Stable thoracic vertebral height and alignment elsewhere. No posterior rib metastasis identified. Chronic but increased thoracic epidural lipomatosis. When combined with a chronic degenerative T7-T8 disc herniation (series 8, image 23) there is increased, now mild to moderate degenerative spinal stenosis without associated cord signal abnormality. Smaller chronic left paracentral disc protrusion at T8-T9. No other thoracic spinal stenosis. The conus medullaris appears normal at T12-L1. Posterior paraspinal soft tissues are within normal limits. MR LUMBAR SPINE FINDINGS Normal lumbar segmentation, concordant  with the thoracic numbering. New decreased T1 marrow signal with enhancement in the anterior T12, L1, L2 and L3 vertebrae. Similar findings at the anterior superior S1 vertebral body. There does not appear to be associated interval disc degeneration at these levels to explain the marrow signal changes on the basis of degenerative etiology. Visible sacral ala and medial iliac bones remain  normal. No associated pathologic fracture. Visualized lower thoracic spinal cord is normal with conus medularis at L1-L2. No abnormal intradural enhancement. Negative visualized abdominal viscera. Increased lumbar epidural lipomatosis in conjunction with progressed L3 through S1 disc, endplate, and facet degeneration. Subsequent new moderate to severe degenerative etiology spinal stenosis at: L3-L4 (series 16, image 22), L4-L5 (series 16, image 27), and L5-S1 (greater on the left, series 16, image 33). There is degenerative appearing mild soft tissue inflammation about the left L4-L5 facet joint. IMPRESSION: 1. Chronic T3 pathologic fracture with chronic but increased bulky epidural tumor and progressed Severe Malignant Cord Compression. Associated mild spinal cord edema. 2. New abnormal marrow signal along the anterior vertebral bodies T12 through L3 most compatible with new osseous metastatic disease. Similar finding in the S1 vertebra. No associated pathologic fracture or epidural tumor at these levels. 3. No other thoracic metastatic disease identified. 4. Superimposed progressed degenerative mid thoracic and lower lumbar spinal stenosis related to disc disease, epidural lipomatosis, and posterior element degeneration. Electronically Signed: By: Genevie Ann M.D. On: 07/05/2015 17:08   A/P OR for emergent decompression. Risks and benefits, bleeding, infection, paralysis, nerve damage, weakness in on or both lower extremities, loss of bowel and or bladder function, and other risks were discussed. They understand, she and her family, and wish to proceed.  Spoke with Rad onc, since tumor is closely apposed to the cord on the right and left sides only conventional radiation could be offered. Will also place a call to [facility] oncology who is now following Sarah [name].

## 2015-07-05 NOTE — ED Provider Notes (Signed)
Physical Exam  BP 129/76 mmHg  Pulse 82  Temp(Src) 99 F (37.2 C) (Oral)  Resp 18  SpO2 96%  Physical Exam  Constitutional: She appears well-developed and well-nourished. No distress.  Obese Caucasian female nontoxic  HENT:  Head: Atraumatic.  Eyes: Conjunctivae are normal.  Neck: Neck supple.  Cardiovascular: Normal rate, regular rhythm and intact distal pulses.   Pulmonary/Chest: Effort normal and breath sounds normal.  Abdominal: Soft. There is no tenderness.  Musculoskeletal: She exhibits tenderness (mild tenderness along spine. ).  Decreased strength to bilateral lower extremities, unable to raise leg off the bed. Patella DTR diminished bilaterally, foot drops.    Neurological: She is alert.  Skin: No rash noted.  Psychiatric: She has a normal mood and affect.  Nursing note and vitals reviewed.   ED Course  Procedures  MDM BP 134/80 mmHg  Pulse 90  Temp(Src) 99 F (37.2 C) (Oral)  Resp 18  SpO2 96%   Results for orders placed or performed during the hospital encounter of 07/05/15  CBC with Differential/Platelet  Result Value Ref Range   WBC 4.8 4.0 - 10.5 K/uL   RBC 3.56 (L) 3.87 - 5.11 MIL/uL   Hemoglobin 10.2 (L) 12.0 - 15.0 g/dL   HCT 30.9 (L) 36.0 - 46.0 %   MCV 86.8 78.0 - 100.0 fL   MCH 28.7 26.0 - 34.0 pg   MCHC 33.0 30.0 - 36.0 g/dL   RDW 13.2 11.5 - 15.5 %   Platelets 203 150 - 400 K/uL   Neutrophils Relative % 67 %   Neutro Abs 3.3 1.7 - 7.7 K/uL   Lymphocytes Relative 24 %   Lymphs Abs 1.1 0.7 - 4.0 K/uL   Monocytes Relative 7 %   Monocytes Absolute 0.3 0.1 - 1.0 K/uL   Eosinophils Relative 2 %   Eosinophils Absolute 0.1 0.0 - 0.7 K/uL   Basophils Relative 0 %   Basophils Absolute 0.0 0.0 - 0.1 K/uL   Dg Thoracic Spine 2 View  07/02/2015  CLINICAL DATA:  Previous posterior upper thoracic fusion. EXAM: THORACIC SPINE 2 VIEWS COMPARISON:  Thoracic spine series of July 03, 2014 FINDINGS: The patient has undergone posterior fusion from what  appears to be T1 through T5. The pedicle screws and connecting rods appear intact. The T-3 compression deformity is not well visualized due to osteopenia and overlying structures. More inferiorly the visualized thoracic vertebral bodies are preserved in height. There are prominent right lateral bridging osteophytes in the mid and lower thoracic spine. The pedicles are grossly intact. IMPRESSION: Stable appearance of the posterior fusion from T1 through T5 for compression fracture T3. Electronically Signed   By: David  Martinique M.D.   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 T3 pathologic fracture treated with surgery including fusion from T1-T5. Worsening lower extremity weakness for 3 weeks. Subsequent encounter. EXAM: MRI THORACIC AND LUMBAR SPINE WITHOUT AND WITH CONTRAST TECHNIQUE: Multiplanar and multiecho pulse sequences of the thoracic and lumbar spine were obtained without and with intravenous contrast. CONTRAST:  52m MULTIHANCE GADOBENATE DIMEGLUMINE 529 MG/ML IV SOLN COMPARISON:  Thoracic spine MRI 12/14/2013. PET-CT 12/22/2014. Lumbar MRI 12/10/2013. Cervical spine MRI 12/14/2013 FINDINGS: MR THORACIC SPINE FINDINGS Limited sagittal imaging of the cervical spine today appears grossly stable to that in 2015. Pathologic T3 compression fracture re- demonstrated with bulky epidural tumor resulting in moderate to severe cord compression (series  8, image 9). This epidural tumor is chronic but progressed compared to 2015 (series 1000, image 8 on 12/14/2013). Evidence of associated cord edema just below the maximal compressive level at T3-T4 (series 8, image 13 and series 7, image 8). Hardware susceptibility artifact in this region, no abnormal spinal cord or intradural enhancement identified. Excluding T12 (see below) no other thoracic vertebral metastatic disease. Stable thoracic vertebral height and  alignment elsewhere. No posterior rib metastasis identified. Chronic but increased thoracic epidural lipomatosis. When combined with a chronic degenerative T7-T8 disc herniation (series 8, image 23) there is increased, now mild to moderate degenerative spinal stenosis without associated cord signal abnormality. Smaller chronic left paracentral disc protrusion at T8-T9. No other thoracic spinal stenosis. The conus medullaris appears normal at T12-L1. Posterior paraspinal soft tissues are within normal limits. MR LUMBAR SPINE FINDINGS Normal lumbar segmentation, concordant with the thoracic numbering. New decreased T1 marrow signal with enhancement in the anterior T12, L1, L2 and L3 vertebrae. Similar findings at the anterior superior S1 vertebral body. There does not appear to be associated interval disc degeneration at these levels to explain the marrow signal changes on the basis of degenerative etiology. Visible sacral ala and medial iliac bones remain normal. No associated pathologic fracture. Visualized lower thoracic spinal cord is normal with conus medularis at L1-L2. No abnormal intradural enhancement. Negative visualized abdominal viscera. Increased lumbar epidural lipomatosis in conjunction with progressed L3 through S1 disc, endplate, and facet degeneration. Subsequent new moderate to severe degenerative etiology spinal stenosis at: L3-L4 (series 16, image 22), L4-L5 (series 16, image 27), and L5-S1 (greater on the left, series 16, image 33). There is degenerative appearing mild soft tissue inflammation about the left L4-L5 facet joint. IMPRESSION: 1. Chronic T3 pathologic fracture with chronic but increased bulky epidural tumor and progressed Severe Malignant Cord Compression. Associated mild spinal cord edema. 2. New abnormal marrow signal along the anterior vertebral bodies T12 through L3 most compatible with new osseous metastatic disease. Similar finding in the S1 vertebra. No associated pathologic  fracture or epidural tumor at these levels. 3. No other thoracic metastatic disease identified. 4. Superimposed progressed degenerative mid thoracic and lower lumbar spinal stenosis related to disc disease, epidural lipomatosis, and posterior element degeneration. Electronically Signed   By: Genevie Ann M.D.   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 T3 pathologic fracture treated with surgery including fusion from T1-T5. Worsening lower extremity weakness for 3 weeks. Subsequent encounter. EXAM: MRI THORACIC AND LUMBAR SPINE WITHOUT AND WITH CONTRAST TECHNIQUE: Multiplanar and multiecho pulse sequences of the thoracic and lumbar spine were obtained without and with intravenous contrast. CONTRAST:  102m MULTIHANCE GADOBENATE DIMEGLUMINE 529 MG/ML IV SOLN COMPARISON:  Thoracic spine MRI 12/14/2013. PET-CT 12/22/2014. Lumbar MRI 12/10/2013. Cervical spine MRI 12/14/2013 FINDINGS: MR THORACIC SPINE FINDINGS Limited sagittal imaging of the cervical spine today appears grossly stable to that in 2015. Pathologic T3 compression fracture re- demonstrated with bulky epidural tumor resulting in moderate to severe cord compression (series 8, image 9). This epidural tumor is chronic but progressed compared to 2015 (series 1000, image 8 on 12/14/2013). Evidence of associated cord edema just below the maximal compressive level at T3-T4 (series 8, image 13 and series 7, image 8). Hardware susceptibility artifact in this region, no abnormal spinal cord or intradural enhancement identified. Excluding T12 (see below) no other thoracic vertebral metastatic disease. Stable thoracic vertebral  height and alignment elsewhere. No posterior rib metastasis identified. Chronic but increased thoracic epidural lipomatosis. When combined with a chronic degenerative T7-T8 disc herniation (series 8, image 23) there is increased, now mild to  moderate degenerative spinal stenosis without associated cord signal abnormality. Smaller chronic left paracentral disc protrusion at T8-T9. No other thoracic spinal stenosis. The conus medullaris appears normal at T12-L1. Posterior paraspinal soft tissues are within normal limits. MR LUMBAR SPINE FINDINGS Normal lumbar segmentation, concordant with the thoracic numbering. New decreased T1 marrow signal with enhancement in the anterior T12, L1, L2 and L3 vertebrae. Similar findings at the anterior superior S1 vertebral body. There does not appear to be associated interval disc degeneration at these levels to explain the marrow signal changes on the basis of degenerative etiology. Visible sacral ala and medial iliac bones remain normal. No associated pathologic fracture. Visualized lower thoracic spinal cord is normal with conus medularis at L1-L2. No abnormal intradural enhancement. Negative visualized abdominal viscera. Increased lumbar epidural lipomatosis in conjunction with progressed L3 through S1 disc, endplate, and facet degeneration. Subsequent new moderate to severe degenerative etiology spinal stenosis at: L3-L4 (series 16, image 22), L4-L5 (series 16, image 27), and L5-S1 (greater on the left, series 16, image 33). There is degenerative appearing mild soft tissue inflammation about the left L4-L5 facet joint. IMPRESSION: 1. Chronic T3 pathologic fracture with chronic but increased bulky epidural tumor and progressed Severe Malignant Cord Compression. Associated mild spinal cord edema. 2. New abnormal marrow signal along the anterior vertebral bodies T12 through L3 most compatible with new osseous metastatic disease. Similar finding in the S1 vertebra. No associated pathologic fracture or epidural tumor at these levels. 3. No other thoracic metastatic disease identified. 4. Superimposed progressed degenerative mid thoracic and lower lumbar spinal stenosis related to disc disease, epidural lipomatosis, and  posterior element degeneration. Electronically Signed   By: Genevie Ann M.D.   On: 07/05/2015 17:08      4:09 PM Pt with hx of thyroid cancer with mets here with lower extremities weakness, bowel/bladder incontinence.  Pt initially evaluated by Donnald Garre, PA-C.  Pt currently awaits MRI of thoracic and lumbar spine.    Dr. Larose Hires is her neurosurgeon.  Pt f/u received radiation in 2015.    5:20 PM T-spine and L-spine MRI did demonstrate increased bulky epidural tumor and severe malignant cord compression at T3. New metastatic disease noted in T12-L3.  Pt made aware of plan.  Will consult neurosurgeon for further care.    6:59 PM Appreciate consultation with neurosurgeon Dr. Christella Noa.  We was made aware pt has bowel/bladder incontinence.  He request for bladder scan, foley insertion and post-void residual.    7:57 PM Bladder scan showing 515 mL of urine.  Foley inserted.  Dr. Cyndy Freeze request radiation oncology to be consulted to see if there's anything they can do.  He will see pt in the ER.    8:49 PM Dr. Cyndy Freeze request for pt to be sent to PACU to [facility] for surgery.  Pt is aware of plan.  Pt has been NPO throughout the day.  Radiation oncology have not call me back yet.    9:24 PM Radiation oncologist Dr. Lisbeth Renshaw has called me and mentioned that he has spoken with Dr. Christella Noa directly in regard to this patient.  He will contact Dr. Tresa Moore who has performed radiation treatment to pt in the past.  Pt likely need surgical management before radiation can be performed.    Domenic Moras,  PA-C 07/05/15 2054  Domenic Moras, PA-C 07/05/15 2125  Lacretia Leigh, MD 07/09/15 1540

## 2015-07-06 ENCOUNTER — Inpatient Hospital Stay (HOSPITAL_COMMUNITY): Payer: BLUE CROSS/BLUE SHIELD

## 2015-07-06 DIAGNOSIS — C7800 Secondary malignant neoplasm of unspecified lung: Secondary | ICD-10-CM | POA: Diagnosis present

## 2015-07-06 DIAGNOSIS — C73 Malignant neoplasm of thyroid gland: Secondary | ICD-10-CM | POA: Diagnosis present

## 2015-07-06 DIAGNOSIS — Z888 Allergy status to other drugs, medicaments and biological substances status: Secondary | ICD-10-CM | POA: Diagnosis not present

## 2015-07-06 DIAGNOSIS — R29898 Other symptoms and signs involving the musculoskeletal system: Secondary | ICD-10-CM | POA: Diagnosis not present

## 2015-07-06 DIAGNOSIS — K219 Gastro-esophageal reflux disease without esophagitis: Secondary | ICD-10-CM | POA: Diagnosis present

## 2015-07-06 DIAGNOSIS — G9520 Unspecified cord compression: Secondary | ICD-10-CM | POA: Diagnosis present

## 2015-07-06 DIAGNOSIS — C799 Secondary malignant neoplasm of unspecified site: Secondary | ICD-10-CM

## 2015-07-06 DIAGNOSIS — C801 Malignant (primary) neoplasm, unspecified: Secondary | ICD-10-CM | POA: Diagnosis not present

## 2015-07-06 DIAGNOSIS — Z881 Allergy status to other antibiotic agents status: Secondary | ICD-10-CM | POA: Diagnosis not present

## 2015-07-06 DIAGNOSIS — G8929 Other chronic pain: Secondary | ICD-10-CM | POA: Diagnosis present

## 2015-07-06 DIAGNOSIS — C7951 Secondary malignant neoplasm of bone: Secondary | ICD-10-CM | POA: Insufficient documentation

## 2015-07-06 DIAGNOSIS — G9340 Encephalopathy, unspecified: Secondary | ICD-10-CM | POA: Diagnosis present

## 2015-07-06 DIAGNOSIS — G8220 Paraplegia, unspecified: Secondary | ICD-10-CM | POA: Diagnosis present

## 2015-07-06 DIAGNOSIS — J9690 Respiratory failure, unspecified, unspecified whether with hypoxia or hypercapnia: Secondary | ICD-10-CM | POA: Diagnosis not present

## 2015-07-06 DIAGNOSIS — E890 Postprocedural hypothyroidism: Secondary | ICD-10-CM | POA: Diagnosis present

## 2015-07-06 DIAGNOSIS — R32 Unspecified urinary incontinence: Secondary | ICD-10-CM | POA: Diagnosis present

## 2015-07-06 DIAGNOSIS — R739 Hyperglycemia, unspecified: Secondary | ICD-10-CM | POA: Diagnosis not present

## 2015-07-06 DIAGNOSIS — D62 Acute posthemorrhagic anemia: Secondary | ICD-10-CM | POA: Diagnosis not present

## 2015-07-06 DIAGNOSIS — Z79899 Other long term (current) drug therapy: Secondary | ICD-10-CM | POA: Diagnosis not present

## 2015-07-06 DIAGNOSIS — K5900 Constipation, unspecified: Secondary | ICD-10-CM | POA: Diagnosis present

## 2015-07-06 DIAGNOSIS — Z923 Personal history of irradiation: Secondary | ICD-10-CM | POA: Diagnosis not present

## 2015-07-06 DIAGNOSIS — M4714 Other spondylosis with myelopathy, thoracic region: Secondary | ICD-10-CM | POA: Diagnosis not present

## 2015-07-06 DIAGNOSIS — G9529 Other cord compression: Secondary | ICD-10-CM

## 2015-07-06 DIAGNOSIS — Z86711 Personal history of pulmonary embolism: Secondary | ICD-10-CM | POA: Diagnosis not present

## 2015-07-06 DIAGNOSIS — J9600 Acute respiratory failure, unspecified whether with hypoxia or hypercapnia: Secondary | ICD-10-CM

## 2015-07-06 DIAGNOSIS — N319 Neuromuscular dysfunction of bladder, unspecified: Secondary | ICD-10-CM | POA: Diagnosis present

## 2015-07-06 DIAGNOSIS — Z01818 Encounter for other preprocedural examination: Secondary | ICD-10-CM | POA: Insufficient documentation

## 2015-07-06 DIAGNOSIS — J988 Other specified respiratory disorders: Secondary | ICD-10-CM | POA: Diagnosis not present

## 2015-07-06 DIAGNOSIS — M6249 Contracture of muscle, multiple sites: Secondary | ICD-10-CM | POA: Diagnosis not present

## 2015-07-06 DIAGNOSIS — I10 Essential (primary) hypertension: Secondary | ICD-10-CM | POA: Diagnosis present

## 2015-07-06 DIAGNOSIS — E669 Obesity, unspecified: Secondary | ICD-10-CM | POA: Diagnosis present

## 2015-07-06 DIAGNOSIS — N179 Acute kidney failure, unspecified: Secondary | ICD-10-CM | POA: Diagnosis present

## 2015-07-06 DIAGNOSIS — N390 Urinary tract infection, site not specified: Secondary | ICD-10-CM | POA: Diagnosis not present

## 2015-07-06 DIAGNOSIS — T380X5A Adverse effect of glucocorticoids and synthetic analogues, initial encounter: Secondary | ICD-10-CM | POA: Diagnosis present

## 2015-07-06 DIAGNOSIS — M62838 Other muscle spasm: Secondary | ICD-10-CM | POA: Diagnosis present

## 2015-07-06 DIAGNOSIS — K592 Neurogenic bowel, not elsewhere classified: Secondary | ICD-10-CM | POA: Diagnosis present

## 2015-07-06 DIAGNOSIS — Z981 Arthrodesis status: Secondary | ICD-10-CM | POA: Diagnosis not present

## 2015-07-06 DIAGNOSIS — M8448XA Pathological fracture, other site, initial encounter for fracture: Secondary | ICD-10-CM | POA: Diagnosis present

## 2015-07-06 DIAGNOSIS — Z6837 Body mass index (BMI) 37.0-37.9, adult: Secondary | ICD-10-CM | POA: Diagnosis not present

## 2015-07-06 DIAGNOSIS — F329 Major depressive disorder, single episode, unspecified: Secondary | ICD-10-CM | POA: Diagnosis present

## 2015-07-06 DIAGNOSIS — Z7901 Long term (current) use of anticoagulants: Secondary | ICD-10-CM | POA: Diagnosis not present

## 2015-07-06 DIAGNOSIS — G8389 Other specified paralytic syndromes: Secondary | ICD-10-CM | POA: Diagnosis not present

## 2015-07-06 DIAGNOSIS — Z886 Allergy status to analgesic agent status: Secondary | ICD-10-CM | POA: Diagnosis not present

## 2015-07-06 DIAGNOSIS — D492 Neoplasm of unspecified behavior of bone, soft tissue, and skin: Secondary | ICD-10-CM | POA: Diagnosis present

## 2015-07-06 LAB — GLUCOSE, CAPILLARY
GLUCOSE-CAPILLARY: 157 mg/dL — AB (ref 65–99)
GLUCOSE-CAPILLARY: 147 mg/dL — AB (ref 65–99)
Glucose-Capillary: 152 mg/dL — ABNORMAL HIGH (ref 65–99)
Glucose-Capillary: 141 mg/dL — ABNORMAL HIGH (ref 65–99)
Glucose-Capillary: 140 mg/dL — ABNORMAL HIGH (ref 65–99)

## 2015-07-06 LAB — POCT I-STAT 7, (LYTES, BLD GAS, ICA,H+H)
ACID-BASE DEFICIT: 1 mmol/L (ref 0.0–2.0)
ACID-BASE DEFICIT: 2 mmol/L (ref 0.0–2.0)
BICARBONATE: 22.6 meq/L (ref 20.0–24.0)
BICARBONATE: 24.2 meq/L — AB (ref 20.0–24.0)
CALCIUM ION: 1.06 mmol/L — AB (ref 1.12–1.23)
Calcium, Ion: 1.06 mmol/L — ABNORMAL LOW (ref 1.12–1.23)
HCT: 23 % — ABNORMAL LOW (ref 36.0–46.0)
HCT: 35 % — ABNORMAL LOW (ref 36.0–46.0)
Hemoglobin: 11.9 g/dL — ABNORMAL LOW (ref 12.0–15.0)
Hemoglobin: 7.8 g/dL — ABNORMAL LOW (ref 12.0–15.0)
O2 Saturation: 100 %
O2 Saturation: 100 %
PCO2 ART: 35.9 mmHg (ref 35.0–45.0)
PH ART: 7.374 (ref 7.350–7.450)
PO2 ART: 243 mmHg — AB (ref 80.0–100.0)
Patient temperature: 36.5
Potassium: 3.5 mmol/L (ref 3.5–5.1)
Potassium: 4.4 mmol/L (ref 3.5–5.1)
SODIUM: 136 mmol/L (ref 135–145)
Sodium: 134 mmol/L — ABNORMAL LOW (ref 135–145)
TCO2: 24 mmol/L (ref 0–100)
TCO2: 26 mmol/L (ref 0–100)
pCO2 arterial: 41.3 mmHg (ref 35.0–45.0)
pH, Arterial: 7.407 (ref 7.350–7.450)
pO2, Arterial: 244 mmHg — ABNORMAL HIGH (ref 80.0–100.0)

## 2015-07-06 LAB — APTT: APTT: 32 s (ref 24–37)

## 2015-07-06 LAB — CBC WITH DIFFERENTIAL/PLATELET
BASOS PCT: 0 %
Basophils Absolute: 0 10*3/uL (ref 0.0–0.1)
EOS ABS: 0 10*3/uL (ref 0.0–0.7)
EOS PCT: 0 %
HCT: 33.7 % — ABNORMAL LOW (ref 36.0–46.0)
Hemoglobin: 11.8 g/dL — ABNORMAL LOW (ref 12.0–15.0)
LYMPHS ABS: 0.6 10*3/uL — AB (ref 0.7–4.0)
Lymphocytes Relative: 11 %
MCH: 29.4 pg (ref 26.0–34.0)
MCHC: 35 g/dL (ref 30.0–36.0)
MCV: 84 fL (ref 78.0–100.0)
Monocytes Absolute: 0.1 10*3/uL (ref 0.1–1.0)
Monocytes Relative: 2 %
Neutro Abs: 5 10*3/uL (ref 1.7–7.7)
Neutrophils Relative %: 87 %
Platelets: 167 10*3/uL (ref 150–400)
RBC: 4.01 MIL/uL (ref 3.87–5.11)
RDW: 12.9 % (ref 11.5–15.5)
WBC: 5.7 10*3/uL (ref 4.0–10.5)

## 2015-07-06 LAB — PROTIME-INR
INR: 1.33 (ref 0.00–1.49)
Prothrombin Time: 16.6 seconds — ABNORMAL HIGH (ref 11.6–15.2)

## 2015-07-06 LAB — BASIC METABOLIC PANEL
Anion gap: 11 (ref 5–15)
BUN: 13 mg/dL (ref 6–20)
CALCIUM: 8.7 mg/dL — AB (ref 8.9–10.3)
CO2: 22 mmol/L (ref 22–32)
CREATININE: 1.05 mg/dL — AB (ref 0.44–1.00)
Chloride: 103 mmol/L (ref 101–111)
GFR calc Af Amer: >60 mL/min (ref >60)
GLUCOSE: 182 mg/dL — AB (ref 65–99)
POTASSIUM: 4.1 mmol/L (ref 3.5–5.1)
SODIUM: 136 mmol/L (ref 135–145)

## 2015-07-06 LAB — MRSA PCR SCREENING: MRSA by PCR: NEGATIVE

## 2015-07-06 LAB — MAGNESIUM: Magnesium: 1.7 mg/dL (ref 1.7–2.4)

## 2015-07-06 LAB — TRIGLYCERIDES: TRIGLYCERIDES: 101 mg/dL (ref <150)

## 2015-07-06 LAB — PHOSPHORUS: Phosphorus: 3.8 mg/dL (ref 2.5–4.6)

## 2015-07-06 MED ORDER — PROPOFOL 1000 MG/100ML IV EMUL
0.0000 ug/kg/min | INTRAVENOUS | Status: DC
  Administered 2015-07-06: 5 ug/kg/min via INTRAVENOUS
  Administered 2015-07-06 (×2): 10 ug/kg/min via INTRAVENOUS
  Filled 2015-07-06: qty 100

## 2015-07-06 MED ORDER — BISACODYL 5 MG PO TBEC
5.0000 mg | DELAYED_RELEASE_TABLET | Freq: Every day | ORAL | Status: DC | PRN
  Administered 2015-07-10: 5 mg via ORAL
  Filled 2015-07-06: qty 1

## 2015-07-06 MED ORDER — LEVOTHYROXINE SODIUM 75 MCG PO TABS
275.0000 ug | ORAL_TABLET | Freq: Every day | ORAL | Status: DC
  Filled 2015-07-06: qty 1

## 2015-07-06 MED ORDER — CHLORHEXIDINE GLUCONATE 0.12% ORAL RINSE (MEDLINE KIT): 15.0000 mL | Freq: Two times a day (BID) | OROMUCOSAL | Status: DC

## 2015-07-06 MED ORDER — SODIUM CHLORIDE 0.9 % IV SOLN: 250.0000 mL | INTRAVENOUS | Status: DC

## 2015-07-06 MED ORDER — SODIUM CHLORIDE 0.9 % IV SOLN
INTRAVENOUS | Status: DC
  Administered 2015-07-06: 03:00:00 via INTRAVENOUS

## 2015-07-06 MED ORDER — ONDANSETRON HCL 4 MG/2ML IJ SOLN
4.0000 mg | INTRAMUSCULAR | Status: DC | PRN
  Administered 2015-07-07 – 2015-07-09 (×4): 4 mg via INTRAVENOUS
  Filled 2015-07-06 (×4): qty 2

## 2015-07-06 MED ORDER — OXYCODONE HCL 5 MG PO TABS: 5.0000 mg | ORAL_TABLET | Freq: Once | ORAL | Status: DC | PRN

## 2015-07-06 MED ORDER — FENTANYL CITRATE (PF) 100 MCG/2ML IJ SOLN: 100.0000 ug | INTRAMUSCULAR | Status: DC | PRN

## 2015-07-06 MED ORDER — HYDROMORPHONE HCL 1 MG/ML IJ SOLN: 0.2500 mg | INTRAMUSCULAR | Status: DC | PRN

## 2015-07-06 MED ORDER — LISINOPRIL 10 MG PO TABS
10.0000 mg | ORAL_TABLET | Freq: Every day | ORAL | Status: DC
Start: 2015-07-06 — End: 2015-07-06

## 2015-07-06 MED ORDER — ACETAMINOPHEN 325 MG PO TABS
650.0000 mg | ORAL_TABLET | ORAL | Status: DC | PRN
Start: 2015-07-06 — End: 2015-07-11

## 2015-07-06 MED ORDER — DEXAMETHASONE SODIUM PHOSPHATE 4 MG/ML IJ SOLN
4.0000 mg | Freq: Four times a day (QID) | INTRAMUSCULAR | Status: DC
  Administered 2015-07-06 (×3): 4 mg via INTRAVENOUS
  Filled 2015-07-06 (×4): qty 1

## 2015-07-06 MED ORDER — ZOLPIDEM TARTRATE 5 MG PO TABS: 5.0000 mg | ORAL_TABLET | Freq: Every evening | ORAL | Status: DC | PRN

## 2015-07-06 MED ORDER — ONDANSETRON HCL 4 MG PO TABS: 4.0000 mg | ORAL_TABLET | Freq: Four times a day (QID) | ORAL | Status: DC

## 2015-07-06 MED ORDER — MENTHOL 3 MG MT LOZG
1.0000 | LOZENGE | OROMUCOSAL | Status: DC | PRN
  Filled 2015-07-06: qty 9

## 2015-07-06 MED ORDER — SENNOSIDES-DOCUSATE SODIUM 8.6-50 MG PO TABS
1.0000 | ORAL_TABLET | Freq: Every evening | ORAL | Status: DC | PRN
  Filled 2015-07-06: qty 1

## 2015-07-06 MED ORDER — SODIUM CHLORIDE 0.9% FLUSH
3.0000 mL | Freq: Two times a day (BID) | INTRAVENOUS | Status: DC
  Administered 2015-07-06 – 2015-07-10 (×8): 3 mL via INTRAVENOUS

## 2015-07-06 MED ORDER — ONDANSETRON HCL 4 MG/2ML IJ SOLN
INTRAMUSCULAR | Status: DC | PRN
  Administered 2015-07-05: 4 mg via INTRAVENOUS

## 2015-07-06 MED ORDER — PHENOL 1.4 % MT LIQD
1.0000 | OROMUCOSAL | Status: DC | PRN
  Filled 2015-07-06: qty 177

## 2015-07-06 MED ORDER — BUPIVACAINE HCL (PF) 0.5 % IJ SOLN
INTRAMUSCULAR | Status: DC | PRN
  Administered 2015-07-06: 30 mL

## 2015-07-06 MED ORDER — OXYCODONE-ACETAMINOPHEN 5-325 MG PO TABS
1.0000 | ORAL_TABLET | ORAL | Status: DC | PRN
  Administered 2015-07-06 – 2015-07-11 (×15): 1 via ORAL
  Filled 2015-07-06 (×11): qty 1
  Filled 2015-07-06: qty 2
  Filled 2015-07-06 (×3): qty 1

## 2015-07-06 MED ORDER — DIAZEPAM 5 MG PO TABS
5.0000 mg | ORAL_TABLET | Freq: Four times a day (QID) | ORAL | Status: DC | PRN
  Administered 2015-07-07 – 2015-07-09 (×5): 5 mg via ORAL
  Filled 2015-07-06 (×5): qty 1

## 2015-07-06 MED ORDER — ANTISEPTIC ORAL RINSE SOLUTION (CORINZ): 7.0000 mL | Freq: Four times a day (QID) | OROMUCOSAL | Status: DC

## 2015-07-06 MED ORDER — LEVOTHYROXINE SODIUM 200 MCG PO TABS: 200.0000 ug | ORAL_TABLET | Freq: Every day | ORAL | Status: DC

## 2015-07-06 MED ORDER — ANTISEPTIC ORAL RINSE SOLUTION (CORINZ)
7.0000 mL | Freq: Four times a day (QID) | OROMUCOSAL | Status: DC
  Administered 2015-07-06: 7 mL via OROMUCOSAL

## 2015-07-06 MED ORDER — MAGNESIUM CITRATE PO SOLN
1.0000 | Freq: Once | ORAL | Status: DC | PRN
  Filled 2015-07-06: qty 296

## 2015-07-06 MED ORDER — PANTOPRAZOLE SODIUM 40 MG IV SOLR
40.0000 mg | INTRAVENOUS | Status: DC
  Administered 2015-07-07 – 2015-07-08 (×2): 40 mg via INTRAVENOUS
  Filled 2015-07-06 (×2): qty 40

## 2015-07-06 MED ORDER — POTASSIUM CHLORIDE CRYS ER 10 MEQ PO TBCR: 10.0000 meq | EXTENDED_RELEASE_TABLET | Freq: Every day | ORAL | Status: DC

## 2015-07-06 MED ORDER — INSULIN ASPART 100 UNIT/ML ~~LOC~~ SOLN
0.0000 [IU] | SUBCUTANEOUS | Status: DC
  Administered 2015-07-06: 2 [IU] via SUBCUTANEOUS
  Administered 2015-07-06: 1 [IU] via SUBCUTANEOUS
  Administered 2015-07-06: 2 [IU] via SUBCUTANEOUS
  Administered 2015-07-06 – 2015-07-07 (×5): 1 [IU] via SUBCUTANEOUS
  Administered 2015-07-07: 2 [IU] via SUBCUTANEOUS
  Administered 2015-07-07: 1 [IU] via SUBCUTANEOUS
  Administered 2015-07-08: 2 [IU] via SUBCUTANEOUS
  Administered 2015-07-08 (×2): 1 [IU] via SUBCUTANEOUS
  Administered 2015-07-08: 2 [IU] via SUBCUTANEOUS
  Administered 2015-07-08 – 2015-07-09 (×4): 1 [IU] via SUBCUTANEOUS
  Administered 2015-07-09: 2 [IU] via SUBCUTANEOUS
  Administered 2015-07-09 – 2015-07-10 (×3): 1 [IU] via SUBCUTANEOUS
  Administered 2015-07-10: 3 [IU] via SUBCUTANEOUS
  Administered 2015-07-10: 2 [IU] via SUBCUTANEOUS

## 2015-07-06 MED ORDER — OXYCODONE HCL 5 MG/5ML PO SOLN: 5.0000 mg | Freq: Once | ORAL | Status: DC | PRN

## 2015-07-06 MED ORDER — SODIUM CHLORIDE 0.9% FLUSH: 3.0000 mL | INTRAVENOUS | Status: DC | PRN

## 2015-07-06 MED ORDER — SERTRALINE HCL 25 MG PO TABS: 25.0000 mg | ORAL_TABLET | Freq: Every day | ORAL | Status: DC

## 2015-07-06 MED ORDER — DEXAMETHASONE 4 MG PO TABS
4.0000 mg | ORAL_TABLET | Freq: Four times a day (QID) | ORAL | Status: DC
  Administered 2015-07-06 – 2015-07-09 (×13): 4 mg via ORAL
  Filled 2015-07-06 (×15): qty 1

## 2015-07-06 MED ORDER — MEPERIDINE HCL 25 MG/ML IJ SOLN: 6.2500 mg | INTRAMUSCULAR | Status: DC | PRN

## 2015-07-06 MED ORDER — ACETAMINOPHEN 650 MG RE SUPP
650.0000 mg | RECTAL | Status: DC | PRN
Start: 2015-07-06 — End: 2015-07-11

## 2015-07-06 MED ORDER — SODIUM CHLORIDE 0.9 % IV SOLN
1.0000 g | Freq: Once | INTRAVENOUS | Status: AC
  Administered 2015-07-06: 1 g via INTRAVENOUS
  Filled 2015-07-06: qty 10

## 2015-07-06 MED ORDER — PANTOPRAZOLE SODIUM 40 MG PO TBEC: 40.0000 mg | DELAYED_RELEASE_TABLET | Freq: Every day | ORAL | Status: DC

## 2015-07-06 MED ORDER — MORPHINE SULFATE (PF) 2 MG/ML IV SOLN
1.0000 mg | INTRAVENOUS | Status: DC | PRN
  Administered 2015-07-06 – 2015-07-07 (×3): 2 mg via INTRAVENOUS
  Filled 2015-07-06 (×3): qty 1

## 2015-07-06 MED ORDER — LEVOTHYROXINE SODIUM 100 MCG IV SOLR
137.5000 ug | Freq: Every day | INTRAVENOUS | Status: DC
  Administered 2015-07-06 – 2015-07-08 (×3): 137.5 ug via INTRAVENOUS
  Filled 2015-07-06 (×3): qty 10

## 2015-07-06 MED ORDER — FAMOTIDINE IN NACL 20-0.9 MG/50ML-% IV SOLN
20.0000 mg | Freq: Two times a day (BID) | INTRAVENOUS | Status: DC
Start: 2015-07-06 — End: 2015-07-06
  Filled 2015-07-06: qty 50

## 2015-07-06 MED ORDER — BACLOFEN 10 MG PO TABS
20.0000 mg | ORAL_TABLET | Freq: Three times a day (TID) | ORAL | Status: DC
  Administered 2015-07-06 – 2015-07-08 (×6): 20 mg via ORAL
  Filled 2015-07-06 (×6): qty 2
  Filled 2015-07-06: qty 1

## 2015-07-06 MED ORDER — POTASSIUM CHLORIDE IN NACL 20-0.9 MEQ/L-% IV SOLN
INTRAVENOUS | Status: DC
  Filled 2015-07-06: qty 1000

## 2015-07-06 MED ORDER — CHLORHEXIDINE GLUCONATE 0.12% ORAL RINSE (MEDLINE KIT)
15.0000 mL | Freq: Two times a day (BID) | OROMUCOSAL | Status: DC
  Administered 2015-07-06: 15 mL via OROMUCOSAL

## 2015-07-06 MED ORDER — ZINC OXIDE 11.3 % EX CREA
1.0000 "application " | TOPICAL_CREAM | Freq: Every day | CUTANEOUS | Status: DC | PRN
  Filled 2015-07-06: qty 56

## 2015-07-06 MED ORDER — DOCUSATE SODIUM 100 MG PO CAPS
100.0000 mg | ORAL_CAPSULE | Freq: Two times a day (BID) | ORAL | Status: DC
  Administered 2015-07-08 – 2015-07-11 (×4): 100 mg via ORAL
  Filled 2015-07-06 (×7): qty 1

## 2015-07-06 NOTE — Progress Notes (Signed)
Patient ID: Sarah [name], female   DOB: 1965/10/30, 50 y.o.   MRN: [id number] Awake and alert but intubated, wiggles feet to command, maybe somewhat weaker proximally, extubate and mobilize as tolerated.

## 2015-07-06 NOTE — Anesthesia Postprocedure Evaluation (Signed)
Anesthesia Post Note  Patient: Sarah [name]  Procedure(s) Performed: Procedure(s) (LRB): THORACIC LAMINECTOMY FOR TUMOR (N/A)  Patient location during evaluation: ICU Anesthesia Type: General Level of consciousness: sedated Pain management: pain level controlled Vital Signs Assessment: post-procedure vital signs reviewed and stable Respiratory status: patient remains intubated per anesthesia plan and patient on ventilator - see flowsheet for VS Cardiovascular status: blood pressure returned to baseline    Last Vitals:  Filed Vitals:   07/06/15 0326 07/06/15 0400  BP: 151/114 130/87  Pulse: 80 73  Temp: 36.6 C 36.6 C  Resp: 16 16    Last Pain:  Filed Vitals:   07/06/15 0453  PainSc: 0-No pain                 Sarah [name] A

## 2015-07-06 NOTE — Progress Notes (Signed)
PULMONARY / CRITICAL CARE MEDICINE   Name: Sarah [name] MRN: [id number] DOB: [birth year]    ADMISSION DATE:  07/05/2015 CONSULTATION DATE:  07/06/2015  REFERRING MD:  Christella Noa  CHIEF COMPLAINT:  Leg weakness  SUBJECTIVE:  Tolerating SBT.  VITAL SIGNS: BP 117/68 mmHg  Pulse 89  Temp(Src) 97.9 F (36.6 C) (Axillary)  Resp 16  Ht '5\' 10"'$  (1.778 m)  Wt 258 lb 9.6 oz (117.3 kg)  BMI 37.11 kg/m2  SpO2 100%  LMP 06/06/2015 (Approximate)  VENTILATOR SETTINGS: Vent Mode:  [-] CPAP;PSV FiO2 (%):  [40 %-50 %] 40 % Set Rate:  [16 bmp] 16 bmp Vt Set:  [550 mL] 550 mL PEEP:  [5 cmH20] 5 cmH20 Pressure Support:  [5 cmH20] 5 cmH20 Plateau Pressure:  [21 cmH20-22 cmH20] 22 cmH20  INTAKE / OUTPUT: I/O last 3 completed shifts: In: 3633 [I.V.:2528; Blood:1005; IV Piggyback:100] Out: 1510 [Urine:885; Blood:625]  PHYSICAL EXAMINATION: General:  alert Neuro:  RASS 0 HEENT:  ETT in place Cardiovascular:  regular Lungs:  No wheeze Abdomen:  Soft, non tender Musculoskeletal:  No edema Skin:  No rashes  LABS:  BMET  Recent Labs Lab 07/05/15 1707 07/05/15 2349 07/06/15 0059 07/06/15 0400  NA 139 136 134* 136  K 4.1 3.5 4.4 4.1  CL 106  --   --  103  CO2 26  --   --  22  BUN 15  --   --  13  CREATININE 1.06*  --   --  1.05*  GLUCOSE 96  --   --  182*    Electrolytes  Recent Labs Lab 07/05/15 1707 07/06/15 0400 07/06/15 0700  CALCIUM 8.5* 8.7*  --   MG  --   --  1.7  PHOS  --   --  3.8    CBC  Recent Labs Lab 07/05/15 1707 07/05/15 2349 07/06/15 0059 07/06/15 0700  WBC 4.8  --   --  5.7  HGB 10.2* 7.8* 11.9* 11.8*  HCT 30.9* 23.0* 35.0* 33.7*  PLT 203  --   --  167    Coag's  Recent Labs Lab 07/05/15 2058 07/06/15 0700  APTT 37 32  INR 1.45 1.33    Sepsis Markers No results for input(s): LATICACIDVEN, PROCALCITON, O2SATVEN in the last 168 hours.  ABG  Recent Labs Lab 07/05/15 2349 07/06/15 0059  PHART 7.407 7.374  PCO2ART 35.9 41.3   PO2ART 244.0* 243.0*    Liver Enzymes  Recent Labs Lab 07/05/15 1707  AST 17  ALT 11*  ALKPHOS 94  BILITOT 1.3*  ALBUMIN 3.4*    Cardiac Enzymes No results for input(s): TROPONINI, PROBNP in the last 168 hours.  Glucose  Recent Labs Lab 07/06/15 0825  GLUCAP 157*    Imaging Mr Thoracic Spine W Wo Contrast  07/05/2015  ADDENDUM REPORT: 07/05/2015 18:58 ADDENDUM: Critical Value/emergent results were called by telephone at the time of interpretation on 07/05/2015 at 1711 hours to ER provider Alvie Heidelberg, [facility], who verbally acknowledged these results. Electronically Signed   By: Genevie Ann M.D.   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 T3 pathologic fracture treated with surgery including fusion from T1-T5. Worsening lower extremity weakness for 3 weeks. Subsequent encounter. EXAM: MRI THORACIC AND LUMBAR SPINE WITHOUT AND WITH CONTRAST TECHNIQUE: Multiplanar and multiecho pulse sequences of the thoracic and lumbar spine were obtained without and with intravenous contrast. CONTRAST:  43m MULTIHANCE GADOBENATE DIMEGLUMINE 529 MG/ML  IV SOLN COMPARISON:  Thoracic spine MRI 12/14/2013. PET-CT 12/22/2014. Lumbar MRI 12/10/2013. Cervical spine MRI 12/14/2013 FINDINGS: MR THORACIC SPINE FINDINGS Limited sagittal imaging of the cervical spine today appears grossly stable to that in 2015. Pathologic T3 compression fracture re- demonstrated with bulky epidural tumor resulting in moderate to severe cord compression (series 8, image 9). This epidural tumor is chronic but progressed compared to 2015 (series 1000, image 8 on 12/14/2013). Evidence of associated cord edema just below the maximal compressive level at T3-T4 (series 8, image 13 and series 7, image 8). Hardware susceptibility artifact in this region, no abnormal spinal cord or intradural enhancement identified. Excluding T12 (see below) no other thoracic vertebral  metastatic disease. Stable thoracic vertebral height and alignment elsewhere. No posterior rib metastasis identified. Chronic but increased thoracic epidural lipomatosis. When combined with a chronic degenerative T7-T8 disc herniation (series 8, image 23) there is increased, now mild to moderate degenerative spinal stenosis without associated cord signal abnormality. Smaller chronic left paracentral disc protrusion at T8-T9. No other thoracic spinal stenosis. The conus medullaris appears normal at T12-L1. Posterior paraspinal soft tissues are within normal limits. MR LUMBAR SPINE FINDINGS Normal lumbar segmentation, concordant with the thoracic numbering. New decreased T1 marrow signal with enhancement in the anterior T12, L1, L2 and L3 vertebrae. Similar findings at the anterior superior S1 vertebral body. There does not appear to be associated interval disc degeneration at these levels to explain the marrow signal changes on the basis of degenerative etiology. Visible sacral ala and medial iliac bones remain normal. No associated pathologic fracture. Visualized lower thoracic spinal cord is normal with conus medularis at L1-L2. No abnormal intradural enhancement. Negative visualized abdominal viscera. Increased lumbar epidural lipomatosis in conjunction with progressed L3 through S1 disc, endplate, and facet degeneration. Subsequent new moderate to severe degenerative etiology spinal stenosis at: L3-L4 (series 16, image 22), L4-L5 (series 16, image 27), and L5-S1 (greater on the left, series 16, image 33). There is degenerative appearing mild soft tissue inflammation about the left L4-L5 facet joint. IMPRESSION: 1. Chronic T3 pathologic fracture with chronic but increased bulky epidural tumor and progressed Severe Malignant Cord Compression. Associated mild spinal cord edema. 2. New abnormal marrow signal along the anterior vertebral bodies T12 through L3 most compatible with new osseous metastatic disease.  Similar finding in the S1 vertebra. No associated pathologic fracture or epidural tumor at these levels. 3. No other thoracic metastatic disease identified. 4. Superimposed progressed degenerative mid thoracic and lower lumbar spinal stenosis related to disc disease, epidural lipomatosis, and posterior element degeneration. Electronically Signed: By: Genevie Ann M.D. On: 07/05/2015 17:08   Mr Lumbar Spine W Wo Contrast  07/05/2015  ADDENDUM REPORT: 07/05/2015 18:58 ADDENDUM: Critical Value/emergent results were called by telephone at the time of interpretation on 07/05/2015 at 1711 hours to ER provider Alvie Heidelberg, [facility], who verbally acknowledged these results. Electronically Signed   By: Genevie Ann M.D.   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 T3 pathologic fracture treated with surgery including fusion from T1-T5. Worsening lower extremity weakness for 3 weeks. Subsequent encounter. EXAM: MRI THORACIC AND LUMBAR SPINE WITHOUT AND WITH CONTRAST TECHNIQUE: Multiplanar and multiecho pulse sequences of the thoracic and lumbar spine were obtained without and with intravenous contrast. CONTRAST:  75m MULTIHANCE GADOBENATE DIMEGLUMINE 529 MG/ML IV SOLN COMPARISON:  Thoracic spine MRI 12/14/2013. PET-CT 12/22/2014. Lumbar MRI 12/10/2013. Cervical spine MRI 12/14/2013 FINDINGS: MR THORACIC SPINE FINDINGS  Limited sagittal imaging of the cervical spine today appears grossly stable to that in 2015. Pathologic T3 compression fracture re- demonstrated with bulky epidural tumor resulting in moderate to severe cord compression (series 8, image 9). This epidural tumor is chronic but progressed compared to 2015 (series 1000, image 8 on 12/14/2013). Evidence of associated cord edema just below the maximal compressive level at T3-T4 (series 8, image 13 and series 7, image 8). Hardware susceptibility artifact in this region, no abnormal spinal cord or  intradural enhancement identified. Excluding T12 (see below) no other thoracic vertebral metastatic disease. Stable thoracic vertebral height and alignment elsewhere. No posterior rib metastasis identified. Chronic but increased thoracic epidural lipomatosis. When combined with a chronic degenerative T7-T8 disc herniation (series 8, image 23) there is increased, now mild to moderate degenerative spinal stenosis without associated cord signal abnormality. Smaller chronic left paracentral disc protrusion at T8-T9. No other thoracic spinal stenosis. The conus medullaris appears normal at T12-L1. Posterior paraspinal soft tissues are within normal limits. MR LUMBAR SPINE FINDINGS Normal lumbar segmentation, concordant with the thoracic numbering. New decreased T1 marrow signal with enhancement in the anterior T12, L1, L2 and L3 vertebrae. Similar findings at the anterior superior S1 vertebral body. There does not appear to be associated interval disc degeneration at these levels to explain the marrow signal changes on the basis of degenerative etiology. Visible sacral ala and medial iliac bones remain normal. No associated pathologic fracture. Visualized lower thoracic spinal cord is normal with conus medularis at L1-L2. No abnormal intradural enhancement. Negative visualized abdominal viscera. Increased lumbar epidural lipomatosis in conjunction with progressed L3 through S1 disc, endplate, and facet degeneration. Subsequent new moderate to severe degenerative etiology spinal stenosis at: L3-L4 (series 16, image 22), L4-L5 (series 16, image 27), and L5-S1 (greater on the left, series 16, image 33). There is degenerative appearing mild soft tissue inflammation about the left L4-L5 facet joint. IMPRESSION: 1. Chronic T3 pathologic fracture with chronic but increased bulky epidural tumor and progressed Severe Malignant Cord Compression. Associated mild spinal cord edema. 2. New abnormal marrow signal along the anterior  vertebral bodies T12 through L3 most compatible with new osseous metastatic disease. Similar finding in the S1 vertebra. No associated pathologic fracture or epidural tumor at these levels. 3. No other thoracic metastatic disease identified. 4. Superimposed progressed degenerative mid thoracic and lower lumbar spinal stenosis related to disc disease, epidural lipomatosis, and posterior element degeneration. Electronically Signed: By: Genevie Ann M.D. On: 07/05/2015 17:08   Portable Chest Xray  07/06/2015  CLINICAL DATA:  Endotracheal tube placement.  Initial encounter. EXAM: PORTABLE CHEST 1 VIEW COMPARISON:  Chest radiograph performed 07/02/2015 FINDINGS: The patient's endotracheal tube is seen ending 2 cm above the carina. The lungs are well-aerated. Mild left basilar opacity may reflect atelectasis or possibly mild pneumonia. There is no evidence of pleural effusion or pneumothorax. The cardiomediastinal silhouette is borderline normal in size. No acute osseous abnormalities are seen. Thoracic spinal fusion hardware is noted. IMPRESSION: 1. Endotracheal tube seen ending 2 cm above the carina. 2. Mild left basilar airspace opacity may reflect atelectasis or possibly mild pneumonia. Electronically Signed   By: Garald Balding M.D.   On: 07/06/2015 03:20     STUDIES:  4/13 MRI T spine >> T3 pathologic fx, new osseous mets to T12 through L3/S1  CULTURES:  ANTIBIOTICS:  SIGNIFICANT EVENTS: 4/13 Admit, T3 laminectomy with resection of tumor  LINES/TUBES: 4/13 ETT >> 4/14  4/13 Rt radial aline >> 4/14  DISCUSSION: 50 yo female with progressive leg weakness 2nd to T3 tumor from metastatic thyroid cancer with cord compression.  Returned from surgery on vent and PCCM consulted.  ASSESSMENT / PLAN:  PULMONARY A: Compromised airway after surgery. P:   Proceed with extubation 4/14 Bronchial hygiene  CARDIOVASCULAR A:  Hx of HTN. Hx of PE. P:  Hold outpt lisinopril for now Hold outpt xarelto  until okay to resume per neurosurgery  RENAL A:   Urine incontinence prior to surgery. P:   Keep foley in for now  GASTROINTESTINAL A:   Hx of GERD. P:   Continue protonix Advance diet after extubation  HEMATOLOGIC A:   Hx of thyroid cancer with mets to T, L, and S spine. P:  Per primary team  INFECTIOUS A:   No evidence for infection. P:   Monitor clinically  ENDOCRINE A:   Hx of hypothyroidism.  P:   Continue synthroid  NEUROLOGIC A:   T3 metastatic lesion with cord compression s/p laminectomy. Chronic pain. Hx of depression. P:   Per neurosurgery  CC time 47 minutes.  Chesley Mires, MD [facility] Pulmonary/Critical Care 07/06/2015, 10:01 AM Pager:  [phone] After 3pm call: [phone]

## 2015-07-06 NOTE — Consult Note (Signed)
Physical Medicine and Rehabilitation Consult  Reason for Consult: Recurrent metastatic tumor at T3 Referring Physician: Dr. Christella Noa   HPI: Sarah [name] is a 50 y.o. 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 T3 tumor resected 01/09/14 and subsequent chemo/XRT.  She has been undergoing therapy with ability to transfer with SB/min assist and attempts at standing--06/24/15.  She has had 3 week history of increase in spasticity and increase in BLE edema. She was admitted on 07/05/15 with B/B incontinence and decline in motor function.  MRI done showing chronic T3 pathologic fracture with increased bulky epidural tumor with severe cord compression, new abnormal marrow signal T12- L3 with new osseous metastatic disease and similar findings S1.  She was taken to OR emergently for T3 laminectomy for subtotal tumor resection by Dr. Christella Noa.  Therapy evaluation to be done today?  CIR consulted in anticipated of ongoing rehab needs.     Review of Systems  Unable to perform ROS: medical condition      Past Medical History  Diagnosis Date  . Hypertension   . DDD (degenerative disc disease), cervical   . DJD (degenerative joint disease)   . Obesity   . Chronic back pain   . Cancer [facility])     FNA of thyroid positive for onconytic/hurthle cell carcinoma  . History of rectal fissure   . Rotator cuff tendonitis right    Past Surgical History  Procedure Laterality Date  . Tonsillectomy    . Laminectomy N/A 12/14/2013    Procedure: THORACIC LAMINECTOMY FOR TUMOR THORACIC THREE;  Surgeon: Ashok Pall, MD;  Location: [place] [facility];  Service: Neurosurgery;  Laterality: N/A;  THORACIC LAMINECTOMY FOR TUMOR THORACIC THREE  . Posterior lumbar fusion 4 level N/A 12/30/2013    Procedure: Thoracic one-Thoracic five posterior thoracic fusion with pedicle screws;  Surgeon: Ashok Pall, MD;  Location: [place] [facility];  Service: Neurosurgery;  Laterality: N/A;  Thoracic one-Thoracic five posterior thoracic fusion with pedicle screws  . Thyroidectomy N/A 01/09/2014    Procedure: TOTAL THYROIDECTOMY;  Surgeon: Izora Gala, MD;  Location: [facility] OR;  Service: ENT;  Laterality: N/A;    Family History  Problem Relation Age of Onset  . Hypertension Mother   . Diabetes Mother   . Hypertension Father   . Stroke Father     Social History:  reports that she has never smoked. She has never used smokeless tobacco. She reports that she does not drink alcohol or use illicit drugs.    Allergies  Allergen Reactions  . Keflex [Cephalexin] Nausea And Vomiting  . Tramadol Nausea And Vomiting  . Hydrocodone Nausea And Vomiting    Medications Prior to Admission  Medication Sig Dispense Refill  . baclofen (LIORESAL) 20 MG tablet Take 1 tablet (20 mg total) by mouth 3 (three) times daily. 120 each 4  . ibuprofen (ADVIL,MOTRIN) 200 MG tablet Take 400 mg by mouth every 6 (six) hours as needed for headache or moderate pain.    [name] Kitchen KLOR-CON M10 10 MEQ tablet TAKE ONE TABLET BY MOUTH ONCE DAILY 30 tablet 5  . levothyroxine (SYNTHROID, LEVOTHROID) 200 MCG tablet Take 200 mcg by mouth daily before breakfast.    . levothyroxine (SYNTHROID, LEVOTHROID) 75 MCG tablet Take by mouth.    [name] Kitchen lisinopril (PRINIVIL,ZESTRIL) 10 MG tablet Take 1 tablet (10 mg total) by mouth daily. 90 tablet 3  .  ondansetron (ZOFRAN) 4 MG tablet Take 1 tablet (4 mg total) by mouth every 6 (six) hours. 30 tablet 3  . pantoprazole (PROTONIX) 40 MG tablet TAKE ONE TABLET BY MOUTH ONCE DAILY 30 tablet 5  . rivaroxaban (XARELTO) 20 MG TABS tablet Take 1 tablet (20 mg total) by mouth daily. 60 tablet 4  . sertraline (ZOLOFT) 25 MG tablet Take 1 tablet (25 mg total) by mouth daily. 30 tablet 5  . zinc oxide (BALMEX) 11.3 % CREA cream Apply 1 application topically daily as needed. Apply to rectum      Home:    Functional  History:   Functional Status:  Mobility:          ADL:    Cognition: Cognition Orientation Level: Intubated/Tracheostomy - Unable to assess     Blood pressure 131/74, pulse 76, temperature 97.8 F (36.6 C), temperature source Axillary, resp. rate 16, height '5\' 10"'$  (1.778 m), weight 117.3 kg (258 lb 9.6 oz), last menstrual period 06/06/2015, SpO2 100 %. Physical Exam  Constitutional: She appears well-developed.  Cardiovascular: Normal rate.   Respiratory:  Intubated, appears comfortable  Neurological:  Pt intubated, difficult to arouse.     Results for orders placed or performed during the hospital encounter of 07/05/15 (from the past 24 hour(s))  CBC with Differential/Platelet     Status: Abnormal   Collection Time: 07/05/15  5:07 PM  Result Value Ref Range   WBC 4.8 4.0 - 10.5 K/uL   RBC 3.56 (L) 3.87 - 5.11 MIL/uL   Hemoglobin 10.2 (L) 12.0 - 15.0 g/dL   HCT 30.9 (L) 36.0 - 46.0 %   MCV 86.8 78.0 - 100.0 fL   MCH 28.7 26.0 - 34.0 pg   MCHC 33.0 30.0 - 36.0 g/dL   RDW 13.2 11.5 - 15.5 %   Platelets 203 150 - 400 K/uL   Neutrophils Relative % 67 %   Neutro Abs 3.3 1.7 - 7.7 K/uL   Lymphocytes Relative 24 %   Lymphs Abs 1.1 0.7 - 4.0 K/uL   Monocytes Relative 7 %   Monocytes Absolute 0.3 0.1 - 1.0 K/uL   Eosinophils Relative 2 %   Eosinophils Absolute 0.1 0.0 - 0.7 K/uL   Basophils Relative 0 %   Basophils Absolute 0.0 0.0 - 0.1 K/uL  Comprehensive metabolic panel     Status: Abnormal   Collection Time: 07/05/15  5:07 PM  Result Value Ref Range   Sodium 139 135 - 145 mmol/L   Potassium 4.1 3.5 - 5.1 mmol/L   Chloride 106 101 - 111 mmol/L   CO2 26 22 - 32 mmol/L   Glucose, Bld 96 65 - 99 mg/dL   BUN 15 6 - 20 mg/dL   Creatinine, Ser 1.06 (H) 0.44 - 1.00 mg/dL   Calcium 8.5 (L) 8.9 - 10.3 mg/dL   Total Protein 6.5 6.5 - 8.1 g/dL   Albumin 3.4 (L) 3.5 - 5.0 g/dL   AST 17 15 - 41 U/L   ALT 11 (L) 14 - 54 U/L   Alkaline Phosphatase 94 38 - 126 U/L   Total  Bilirubin 1.3 (H) 0.3 - 1.2 mg/dL   GFR calc non Af Amer >60 >60 mL/min   GFR calc Af Amer >60 >60 mL/min   Anion gap 7 5 - 15  Urinalysis, Routine w reflex microscopic (not at [facility])     Status: None   Collection Time: 07/05/15  7:39 PM  Result Value Ref Range   Color, Urine  YELLOW YELLOW   APPearance CLEAR CLEAR   Specific Gravity, Urine 1.018 1.005 - 1.030   pH 7.0 5.0 - 8.0   Glucose, UA NEGATIVE NEGATIVE mg/dL   Hgb urine dipstick NEGATIVE NEGATIVE   Bilirubin Urine NEGATIVE NEGATIVE   Ketones, ur NEGATIVE NEGATIVE mg/dL   Protein, ur NEGATIVE NEGATIVE mg/dL   Nitrite NEGATIVE NEGATIVE   Leukocytes, UA NEGATIVE NEGATIVE  APTT     Status: None   Collection Time: 07/05/15  8:58 PM  Result Value Ref Range   aPTT 37 24 - 37 seconds  Protime-INR     Status: Abnormal   Collection Time: 07/05/15  8:58 PM  Result Value Ref Range   Prothrombin Time 17.7 (H) 11.6 - 15.2 seconds   INR 1.45 0.00 - 1.49  Type and screen     Status: None (Preliminary result)   Collection Time: 07/05/15 11:00 PM  Result Value Ref Range   ABO/RH(D) O POS    Antibody Screen NEG    Sample Expiration 07/08/2015    Unit Number [id number]    Blood Component Type RED CELLS,LR    Unit division 00    Status of Unit ISSUED    Transfusion Status OK TO TRANSFUSE    Crossmatch Result Compatible    Unit Number [id number]    Blood Component Type RED CELLS,LR    Unit division 00    Status of Unit ISSUED    Transfusion Status OK TO TRANSFUSE    Crossmatch Result Compatible    Unit Number [id number]    Blood Component Type RBC LR PHER2    Unit division 00    Status of Unit ALLOCATED    Transfusion Status OK TO TRANSFUSE    Crossmatch Result Compatible    Unit Number [id number]    Blood Component Type RED CELLS,LR    Unit division 00    Status of Unit ISSUED    Transfusion Status OK TO TRANSFUSE    Crossmatch Result Compatible    Unit Number [id number]    Blood Component Type RBC LR PHER2      Unit division 00    Status of Unit ALLOCATED    Transfusion Status OK TO TRANSFUSE    Crossmatch Result Compatible    Unit Number [id number]    Blood Component Type RBC LR PHER1    Unit division 00    Status of Unit ALLOCATED    Transfusion Status OK TO TRANSFUSE    Crossmatch Result Compatible   Prepare RBC (crossmatch)     Status: None   Collection Time: 07/05/15 11:21 PM  Result Value Ref Range   Order Confirmation ORDER PROCESSED BY BLOOD BANK   I-STAT 7, (LYTES, BLD GAS, ICA, H+H)     Status: Abnormal   Collection Time: 07/05/15 11:49 PM  Result Value Ref Range   pH, Arterial 7.407 7.350 - 7.450   pCO2 arterial 35.9 35.0 - 45.0 mmHg   pO2, Arterial 244.0 (H) 80.0 - 100.0 mmHg   Bicarbonate 22.6 20.0 - 24.0 mEq/L   TCO2 24 0 - 100 mmol/L   O2 Saturation 100.0 %   Acid-base deficit 2.0 0.0 - 2.0 mmol/L   Sodium 136 135 - 145 mmol/L   Potassium 3.5 3.5 - 5.1 mmol/L   Calcium, Ion 1.06 (L) 1.12 - 1.23 mmol/L   HCT 23.0 (L) 36.0 - 46.0 %   Hemoglobin 7.8 (L) 12.0 - 15.0 g/dL   Patient temperature 36.9 C    Collection site RADIAL,  ALLEN'S TEST ACCEPTABLE    Sample type ARTERIAL   I-STAT 7, (LYTES, BLD GAS, ICA, H+H)     Status: Abnormal   Collection Time: 07/06/15 12:59 AM  Result Value Ref Range   pH, Arterial 7.374 7.350 - 7.450   pCO2 arterial 41.3 35.0 - 45.0 mmHg   pO2, Arterial 243.0 (H) 80.0 - 100.0 mmHg   Bicarbonate 24.2 (H) 20.0 - 24.0 mEq/L   TCO2 26 0 - 100 mmol/L   O2 Saturation 100.0 %   Acid-base deficit 1.0 0.0 - 2.0 mmol/L   Sodium 134 (L) 135 - 145 mmol/L   Potassium 4.4 3.5 - 5.1 mmol/L   Calcium, Ion 1.06 (L) 1.12 - 1.23 mmol/L   HCT 35.0 (L) 36.0 - 46.0 %   Hemoglobin 11.9 (L) 12.0 - 15.0 g/dL   Patient temperature 36.5 C    Collection site RADIAL, ALLEN'S TEST ACCEPTABLE    Sample type ARTERIAL   MRSA PCR Screening     Status: None   Collection Time: 07/06/15  3:35 AM  Result Value Ref Range   MRSA by PCR NEGATIVE NEGATIVE   Triglycerides     Status: None   Collection Time: 07/06/15  4:00 AM  Result Value Ref Range   Triglycerides 101 <150 mg/dL  Basic metabolic panel     Status: Abnormal   Collection Time: 07/06/15  4:00 AM  Result Value Ref Range   Sodium 136 135 - 145 mmol/L   Potassium 4.1 3.5 - 5.1 mmol/L   Chloride 103 101 - 111 mmol/L   CO2 22 22 - 32 mmol/L   Glucose, Bld 182 (H) 65 - 99 mg/dL   BUN 13 6 - 20 mg/dL   Creatinine, Ser 1.05 (H) 0.44 - 1.00 mg/dL   Calcium 8.7 (L) 8.9 - 10.3 mg/dL   GFR calc non Af Amer >60 >60 mL/min   GFR calc Af Amer >60 >60 mL/min   Anion gap 11 5 - 15  CBC with Differential/Platelet     Status: Abnormal   Collection Time: 07/06/15  7:00 AM  Result Value Ref Range   WBC 5.7 4.0 - 10.5 K/uL   RBC 4.01 3.87 - 5.11 MIL/uL   Hemoglobin 11.8 (L) 12.0 - 15.0 g/dL   HCT 33.7 (L) 36.0 - 46.0 %   MCV 84.0 78.0 - 100.0 fL   MCH 29.4 26.0 - 34.0 pg   MCHC 35.0 30.0 - 36.0 g/dL   RDW 12.9 11.5 - 15.5 %   Platelets 167 150 - 400 K/uL   Neutrophils Relative % 87 %   Neutro Abs 5.0 1.7 - 7.7 K/uL   Lymphocytes Relative 11 %   Lymphs Abs 0.6 (L) 0.7 - 4.0 K/uL   Monocytes Relative 2 %   Monocytes Absolute 0.1 0.1 - 1.0 K/uL   Eosinophils Relative 0 %   Eosinophils Absolute 0.0 0.0 - 0.7 K/uL   Basophils Relative 0 %   Basophils Absolute 0.0 0.0 - 0.1 K/uL  Magnesium     Status: None   Collection Time: 07/06/15  7:00 AM  Result Value Ref Range   Magnesium 1.7 1.7 - 2.4 mg/dL  Phosphorus     Status: None   Collection Time: 07/06/15  7:00 AM  Result Value Ref Range   Phosphorus 3.8 2.5 - 4.6 mg/dL  APTT     Status: None   Collection Time: 07/06/15  7:00 AM  Result Value Ref Range   aPTT 32 24 - 37 seconds  Protime-INR     Status: Abnormal   Collection Time: 07/06/15  7:00 AM  Result Value Ref Range   Prothrombin Time 16.6 (H) 11.6 - 15.2 seconds   INR 1.33 0.00 - 1.49   Mr Thoracic Spine W Wo Contrast  07/05/2015  ADDENDUM REPORT: 07/05/2015 18:58  ADDENDUM: Critical Value/emergent results were called by telephone at the time of interpretation on 07/05/2015 at 1711 hours to ER provider Alvie Heidelberg, [facility], who verbally acknowledged these results. Electronically Signed   By: Genevie Ann M.D.   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 T3 pathologic fracture treated with surgery including fusion from T1-T5. Worsening lower extremity weakness for 3 weeks. Subsequent encounter. EXAM: MRI THORACIC AND LUMBAR SPINE WITHOUT AND WITH CONTRAST TECHNIQUE: Multiplanar and multiecho pulse sequences of the thoracic and lumbar spine were obtained without and with intravenous contrast. CONTRAST:  46m MULTIHANCE GADOBENATE DIMEGLUMINE 529 MG/ML IV SOLN COMPARISON:  Thoracic spine MRI 12/14/2013. PET-CT 12/22/2014. Lumbar MRI 12/10/2013. Cervical spine MRI 12/14/2013 FINDINGS: MR THORACIC SPINE FINDINGS Limited sagittal imaging of the cervical spine today appears grossly stable to that in 2015. Pathologic T3 compression fracture re- demonstrated with bulky epidural tumor resulting in moderate to severe cord compression (series 8, image 9). This epidural tumor is chronic but progressed compared to 2015 (series 1000, image 8 on 12/14/2013). Evidence of associated cord edema just below the maximal compressive level at T3-T4 (series 8, image 13 and series 7, image 8). Hardware susceptibility artifact in this region, no abnormal spinal cord or intradural enhancement identified. Excluding T12 (see below) no other thoracic vertebral metastatic disease. Stable thoracic vertebral height and alignment elsewhere. No posterior rib metastasis identified. Chronic but increased thoracic epidural lipomatosis. When combined with a chronic degenerative T7-T8 disc herniation (series 8, image 23) there is increased, now mild to moderate degenerative spinal stenosis without associated cord signal abnormality. Smaller chronic  left paracentral disc protrusion at T8-T9. No other thoracic spinal stenosis. The conus medullaris appears normal at T12-L1. Posterior paraspinal soft tissues are within normal limits. MR LUMBAR SPINE FINDINGS Normal lumbar segmentation, concordant with the thoracic numbering. New decreased T1 marrow signal with enhancement in the anterior T12, L1, L2 and L3 vertebrae. Similar findings at the anterior superior S1 vertebral body. There does not appear to be associated interval disc degeneration at these levels to explain the marrow signal changes on the basis of degenerative etiology. Visible sacral ala and medial iliac bones remain normal. No associated pathologic fracture. Visualized lower thoracic spinal cord is normal with conus medularis at L1-L2. No abnormal intradural enhancement. Negative visualized abdominal viscera. Increased lumbar epidural lipomatosis in conjunction with progressed L3 through S1 disc, endplate, and facet degeneration. Subsequent new moderate to severe degenerative etiology spinal stenosis at: L3-L4 (series 16, image 22), L4-L5 (series 16, image 27), and L5-S1 (greater on the left, series 16, image 33). There is degenerative appearing mild soft tissue inflammation about the left L4-L5 facet joint. IMPRESSION: 1. Chronic T3 pathologic fracture with chronic but increased bulky epidural tumor and progressed Severe Malignant Cord Compression. Associated mild spinal cord edema. 2. New abnormal marrow signal along the anterior vertebral bodies T12 through L3 most compatible with new osseous metastatic disease. Similar finding in the S1 vertebra. No associated pathologic fracture or epidural tumor at these levels. 3. No other thoracic metastatic disease identified. 4. Superimposed progressed degenerative mid thoracic and lower lumbar spinal stenosis related to disc disease, epidural  lipomatosis, and posterior element degeneration. Electronically Signed: By: Genevie Ann M.D. On: 07/05/2015 17:08    Mr Lumbar Spine W Wo Contrast  07/05/2015  ADDENDUM REPORT: 07/05/2015 18:58 ADDENDUM: Critical Value/emergent results were called by telephone at the time of interpretation on 07/05/2015 at 1711 hours to ER provider Alvie Heidelberg, [facility], who verbally acknowledged these results. Electronically Signed   By: Genevie Ann M.D.   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 T3 pathologic fracture treated with surgery including fusion from T1-T5. Worsening lower extremity weakness for 3 weeks. Subsequent encounter. EXAM: MRI THORACIC AND LUMBAR SPINE WITHOUT AND WITH CONTRAST TECHNIQUE: Multiplanar and multiecho pulse sequences of the thoracic and lumbar spine were obtained without and with intravenous contrast. CONTRAST:  86m MULTIHANCE GADOBENATE DIMEGLUMINE 529 MG/ML IV SOLN COMPARISON:  Thoracic spine MRI 12/14/2013. PET-CT 12/22/2014. Lumbar MRI 12/10/2013. Cervical spine MRI 12/14/2013 FINDINGS: MR THORACIC SPINE FINDINGS Limited sagittal imaging of the cervical spine today appears grossly stable to that in 2015. Pathologic T3 compression fracture re- demonstrated with bulky epidural tumor resulting in moderate to severe cord compression (series 8, image 9). This epidural tumor is chronic but progressed compared to 2015 (series 1000, image 8 on 12/14/2013). Evidence of associated cord edema just below the maximal compressive level at T3-T4 (series 8, image 13 and series 7, image 8). Hardware susceptibility artifact in this region, no abnormal spinal cord or intradural enhancement identified. Excluding T12 (see below) no other thoracic vertebral metastatic disease. Stable thoracic vertebral height and alignment elsewhere. No posterior rib metastasis identified. Chronic but increased thoracic epidural lipomatosis. When combined with a chronic degenerative T7-T8 disc herniation (series 8, image 23) there is increased, now mild to moderate  degenerative spinal stenosis without associated cord signal abnormality. Smaller chronic left paracentral disc protrusion at T8-T9. No other thoracic spinal stenosis. The conus medullaris appears normal at T12-L1. Posterior paraspinal soft tissues are within normal limits. MR LUMBAR SPINE FINDINGS Normal lumbar segmentation, concordant with the thoracic numbering. New decreased T1 marrow signal with enhancement in the anterior T12, L1, L2 and L3 vertebrae. Similar findings at the anterior superior S1 vertebral body. There does not appear to be associated interval disc degeneration at these levels to explain the marrow signal changes on the basis of degenerative etiology. Visible sacral ala and medial iliac bones remain normal. No associated pathologic fracture. Visualized lower thoracic spinal cord is normal with conus medularis at L1-L2. No abnormal intradural enhancement. Negative visualized abdominal viscera. Increased lumbar epidural lipomatosis in conjunction with progressed L3 through S1 disc, endplate, and facet degeneration. Subsequent new moderate to severe degenerative etiology spinal stenosis at: L3-L4 (series 16, image 22), L4-L5 (series 16, image 27), and L5-S1 (greater on the left, series 16, image 33). There is degenerative appearing mild soft tissue inflammation about the left L4-L5 facet joint. IMPRESSION: 1. Chronic T3 pathologic fracture with chronic but increased bulky epidural tumor and progressed Severe Malignant Cord Compression. Associated mild spinal cord edema. 2. New abnormal marrow signal along the anterior vertebral bodies T12 through L3 most compatible with new osseous metastatic disease. Similar finding in the S1 vertebra. No associated pathologic fracture or epidural tumor at these levels. 3. No other thoracic metastatic disease identified. 4. Superimposed progressed degenerative mid thoracic and lower lumbar spinal stenosis related to disc disease, epidural lipomatosis, and posterior  element degeneration. Electronically Signed: By: HGenevie AnnM.D. On: 07/05/2015 17:08   Portable Chest Xray  07/06/2015  CLINICAL DATA:  Endotracheal tube placement.  Initial encounter. EXAM: PORTABLE CHEST 1 VIEW COMPARISON:  Chest radiograph performed 07/02/2015 FINDINGS: The patient's endotracheal tube is seen ending 2 cm above the carina. The lungs are well-aerated. Mild left basilar opacity may reflect atelectasis or possibly mild pneumonia. There is no evidence of pleural effusion or pneumothorax. The cardiomediastinal silhouette is borderline normal in size. No acute osseous abnormalities are seen. Thoracic spinal fusion hardware is noted. IMPRESSION: 1. Endotracheal tube seen ending 2 cm above the carina. 2. Mild left basilar airspace opacity may reflect atelectasis or possibly mild pneumonia. Electronically Signed   By: Garald Balding M.D.   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 Does the need for close, 24 hr/day medical supervision in concert with the patient's rehab needs make it unreasonable for this patient to be served in a less intensive setting? Potentially 2. Co-Morbidities requiring supervision/potential complications: neurogenic bowel/bladder, spasticity, etc 3. Due to bladder management, bowel management, safety, skin/wound care, disease management, medication administration and pain management, does the patient require 24 hr/day rehab nursing? Potentially 4. Does the patient require coordinated care of a physician, rehab nurse, PT (1-2 hrs/day, 5 days/week) and OT (1-2 hrs/day, 5 days/week) to address physical and functional deficits in the context of the above medical diagnosis(es)? Potentially Addressing deficits in the following areas: balance, endurance, locomotion, strength, transferring, bowel/bladder control, bathing, dressing, feeding, grooming, toileting and psychosocial  support 5. Can the patient actively participate in an intensive therapy program of at least 3 hrs of therapy per day at least 5 days per week? Potentially 6. The potential for patient to make measurable gains while on inpatient rehab is good 7. Anticipated functional outcomes upon discharge from inpatient rehab are TBD  with PT, TBD with OT, TBd with SLP. 8. Estimated rehab length of stay to reach the above functional goals is: to be determined 9. Does the patient have adequate social supports and living environment to accommodate these discharge functional goals? Yes 10. Anticipated D/C setting: Home 11. Anticipated post D/C treatments: HH therapy and Outpatient therapy 12. Overall Rehab/Functional Prognosis: good and fair  RECOMMENDATIONS: This patient's condition is appropriate for continued rehabilitative care in the following setting: likely inpatient rehab Patient has agreed to participate in recommended program. Potentially Note that insurance prior authorization may be required for reimbursement for recommended care.  Comment: Pt is WELL known to me. She is extremely motivated and has a supportive family. Had been making nice functional progress prior to this hospitalization. Currently on vent. Will follow along for medical and functional progress.   [name], MD, [facility] 07/06/2015     07/06/2015

## 2015-07-06 NOTE — Op Note (Signed)
07/05/2015 - 07/06/2015  2:27 AM  PATIENT:  Sarah [name]  50 y.o. female whom today found herself unable to walk. With a known history of T3 metastatic thyroid tumor and previous decompression in 10/15, MRI today showed recurrence, significant tumor in the epidural space compressing the spinal cord. Sarah [name] is going to the operating room for an emergent thoracic decompression.   PRE-OPERATIVE DIAGNOSIS:  metastatic thyroid cancer  POST-OPERATIVE DIAGNOSIS:  metastatic thyroid cancer  PROCEDURE:  Procedure(s): THORACIC 3 LAMINECTOMY FOR TUMOR subtotal resection  SURGEON: Surgeon(s): Ashok Pall, MD Eustace Moore, MD  ASSISTANTS:Jones, Shanon Brow  ANESTHESIA:   general  EBL:  Total I/O In: 1751 [I.V.:2000; Blood:1005] Out: 1310 [Urine:685; Blood:625]  BLOOD ADMINISTERED:625 CC PRBC  CELL SAVER GIVEN:none  COUNT:per nursing  DRAINS: none   SPECIMEN:  Source of Specimen:  T3 vertebral body  DICTATION: Sarah [name] was taken to the operating room, intubated, and placed under a general anesthetic without difficulty. female She was positioned prone on the Woodruff table with all pressure points padded. Sarah [name] was prepped and draped in a sterile manner. I opened the old incision with a 10 blade and dissected sharply with Dr. Ronnald Ramp' assistance to expose the pedicle screws and rods. We followed the rods on both sides which allowed [place] to localize the tumor per the preop films. We started working to remove the scar tissue overlying the spinal cord. We used the drill and Kerrison punches to remove bone which grew again to cover the thecal sac. Laterally we were able to expose brownish grey tissue which was tumor. We did this on both the right and left sides. We worked around and anterior to the spinal cord until there was very little tumor in direct contact with the theca. We decompressed the spinal cord by removing tumor.  We achieved hemostasis with gelfoam, and surgifoam. I irrigated  the wound then closed.  I approximated the thoracolumbar fascia, subcutaneous, and subcuticular layers with vicryl sutures. I approximated the skin edges with nylon. I applied a sterile dressing. [name] was then repositioned and rolled back onto the stretcher, and remained intubated.   PLAN OF CARE: Admit to inpatient   PATIENT DISPOSITION:  PACU - hemodynamically stable.   Delay start of Pharmacological VTE agent (>24hrs) due to surgical blood loss or risk of bleeding:  yes

## 2015-07-06 NOTE — Progress Notes (Signed)
Inpatient Rehabilitation  Note team's recommendations for IP Rehab.  Patient currently on vent.  Will follow along for readiness.    Carmelia Roller., CCC/SLP Admission Coordinator  [place]  Cell [phone]

## 2015-07-06 NOTE — Evaluation (Signed)
Occupational Therapy Evaluation Patient Details Name: Sarah [name] MRN: [id number] DOB: 06-30-1965 Today's Date: 07/06/2015    History of Present Illness pt presents after a decline in motor function and was found to have reoccurance of T3 Tumor now s/p T3 Lami with Tumor Resection.  pt with hx of Metastatic Thyroid CA, T3 Tumor Resection 12/2013, and HTN.     Clinical Impression   Pt admitted with above. She demonstrates the below listed deficits and will benefit from continued OT to maximize safety and independence with BADLs.  Pt presents to OT with acute pain, paraparesis, impaired balance.  She requires minA  For UB ADLs and max A for LB ADLs.  She is very motivated and has good support at home.  Recommend CIR.       Follow Up Recommendations  CIR;Supervision/Assistance - 24 hour    Equipment Recommendations  None recommended by OT    Recommendations for Other Services Rehab consult     Precautions / Restrictions Precautions Precautions: Back;Fall Precaution Booklet Issued: No      Mobility Bed Mobility Overal bed mobility: Needs Assistance;+2 for physical assistance Bed Mobility: Rolling;Sidelying to Sit;Sit to Sidelying Rolling: Mod assist;+2 for physical assistance Sidelying to sit: Total assist;+2 for physical assistance     Sit to sidelying: Mod assist;+2 for physical assistance General bed mobility comments: Extensive A for coming to sitting due to need for total A with decreasing spasticity in Bil LEs and maintaining positioning of Bil LEs.  pt much improved ability to A with returning to bed with decreased spasticity.    Transfers                 General transfer comment: unable to safely attempt     Balance Overall balance assessment: Needs assistance Sitting-balance support: Feet supported;Single extremity supported Sitting balance-Leahy Scale: Poor Sitting balance - Comments: fluctuated between ModA and MinG while sitting EOB ~18 mins.  pt  worked on prop on bil elbows, posture, and maintaining balance.                                      ADL Overall ADL's : Needs assistance/impaired Eating/Feeding: Independent   Grooming: Wash/dry hands;Wash/dry face;Oral care;Applying deodorant;Brushing hair;Set up;Bed level   Upper Body Bathing: Set up;Bed level   Lower Body Bathing: Maximal assistance;Bed level   Upper Body Dressing : Bed level;Moderate assistance   Lower Body Dressing: Total assistance;Bed level   Toilet Transfer: Total assistance Toilet Transfer Details (indicate cue type and reason): unable to safely attempt  Toileting- Clothing Manipulation and Hygiene: Total assistance;Bed level       Functional mobility during ADLs: Maximal assistance;+2 for physical assistance (bed mobility )       Vision     Perception     Praxis      Pertinent Vitals/Pain Pain Assessment: 0-10 Pain Score: 4  Pain Location: back and LEs  Pain Descriptors / Indicators: Aching;Grimacing;Spasm Pain Intervention(s): Monitored during session;Repositioned     Hand Dominance Right   Extremity/Trunk Assessment Upper Extremity Assessment Upper Extremity Assessment: Overall WFL for tasks assessed   Lower Extremity Assessment Lower Extremity Assessment: Defer to PT evaluation   Cervical / Trunk Assessment Cervical / Trunk Assessment: Kyphotic   Communication Communication Communication: No difficulties   Cognition Arousal/Alertness: Awake/alert Behavior During Therapy: WFL for tasks assessed/performed Overall Cognitive Status: Within Functional Limits for tasks assessed  General Comments       Exercises       Shoulder Instructions      Home Living Family/patient expects to be discharged to:: Inpatient rehab Living Arrangements: Other relatives (sister and brother in law ) Available Help at Discharge: Family;Available 24 hours/day Type of Home: House Home Access: Ramped  entrance     Home Layout: One level     Bathroom Shower/Tub: Tub/shower unit     Bathroom Accessibility: No   Home Equipment: Bedside commode;Wheelchair - manual;Hospital bed   Additional Comments: Pt lives with sister       Prior Functioning/Environment Level of Independence: Needs assistance  Gait / Transfers Assistance Needed: pt was slide board transfering and scooting to drop arm with MinG prior to decline.   ADL's / Homemaking Assistance Needed: Performed bed level baths and dressed herself.  Family performed homemaking tasks.          OT Diagnosis: Generalized weakness;Acute pain;Paresis   OT Problem List: Decreased strength;Decreased activity tolerance;Impaired balance (sitting and/or standing);Impaired tone;Pain   OT Treatment/Interventions: Self-care/ADL training;Therapeutic exercise;DME and/or AE instruction;Therapeutic activities;Patient/family education;Balance training    OT Goals(Current goals can be found in the care plan section) Acute Rehab OT Goals Patient Stated Goal: to regain independence  OT Goal Formulation: With patient Time For Goal Achievement: 07/20/15 Potential to Achieve Goals: Good ADL Goals Pt Will Perform Lower Body Bathing: with mod assist;bed level Pt Will Perform Upper Body Dressing: with min assist;sitting;bed level Pt Will Perform Lower Body Dressing: with mod assist;bed level;with adaptive equipment Pt Will Transfer to Toilet: with mod assist;bedside commode (sliding board )  OT Frequency: Min 2X/week   Barriers to D/C:            Co-evaluation PT/OT/SLP Co-Evaluation/Treatment: Yes Reason for Co-Treatment: Complexity of the patient's impairments (multi-system involvement);For patient/therapist safety   OT goals addressed during session: Strengthening/ROM;ADL's and self-care      End of Session Nurse Communication: Mobility status  Activity Tolerance: Patient tolerated treatment well Patient left: in bed;with call  bell/phone within reach;with bed alarm set;with family/visitor present   Time: 5027-7412 OT Time Calculation (min): 17 min Charges:  OT General Charges $OT Visit: 1 Procedure OT Evaluation $OT Eval Moderate Complexity: 1 Procedure G-Codes:    Lucille Passy M 27-Jul-2015, 7:32 PM

## 2015-07-06 NOTE — Consult Note (Signed)
PULMONARY / CRITICAL CARE MEDICINE   Name: Sarah [name] MRN: [id number] DOB: [birth year]    ADMISSION DATE:  07/05/2015 CONSULTATION DATE:  07/06/15  REFERRING MD:  Christella Noa  CHIEF COMPLAINT:  LE weakeness  HISTORY OF PRESENT ILLNESS:  Pt is encephelopathic; therefore, this HPI is obtained from chart review. Sarah [name] is a 50 y.o. female with PMH including but not limited to metastatic thyroid CA s/p total thyroidectomy (01/09/14) as well as radiation oncology .  She has been seen by neurosurgery (Dr. Christella Noa) in the past for metastatic T3 tumor and had decompression / laminectomy on 12/30/13.  She had done well since then, but early in 2017, she began to have increasing LE weakness.  She saw Dr. Christella Noa again on 04/10 who had ordered an MRI which was to be done 2 weeks after her outpatient appointment.  On 07/05/15, she had increasing weakness to the point that she was unable to stand, walk, and also had bowel and bladder incontinence.  She presented to the ED where an MRI revealed tumor recurrence at T3 causing spinal cord compression.  She was taken to the OR for emergent decompression and post op, she returned to the ICU on the ventilator; therefore, PCCM was consulted.   PAST MEDICAL HISTORY :  She  has a past medical history of Hypertension; DDD (degenerative disc disease), cervical; DJD (degenerative joint disease); Obesity; Chronic back pain; Cancer ([place]); History of rectal fissure; and Rotator cuff tendonitis (right).  PAST SURGICAL HISTORY: She  has past surgical history that includes Tonsillectomy; Laminectomy (N/A, 12/14/2013); Posterior lumbar fusion 4 level (N/A, 12/30/2013); and Thyroidectomy (N/A, 01/09/2014).  Allergies  Allergen Reactions  . Keflex [Cephalexin] Nausea And Vomiting  . Tramadol Nausea And Vomiting  . Hydrocodone Nausea And Vomiting    No current facility-administered medications on file prior to encounter.   Current Outpatient Prescriptions on  File Prior to Encounter  Medication Sig  . baclofen (LIORESAL) 20 MG tablet Take 1 tablet (20 mg total) by mouth 3 (three) times daily.  [name] Kitchen KLOR-CON M10 10 MEQ tablet TAKE ONE TABLET BY MOUTH ONCE DAILY  . levothyroxine (SYNTHROID, LEVOTHROID) 200 MCG tablet Take 200 mcg by mouth daily before breakfast.  . levothyroxine (SYNTHROID, LEVOTHROID) 75 MCG tablet Take by mouth.  [name] Kitchen lisinopril (PRINIVIL,ZESTRIL) 10 MG tablet Take 1 tablet (10 mg total) by mouth daily.  . ondansetron (ZOFRAN) 4 MG tablet Take 1 tablet (4 mg total) by mouth every 6 (six) hours.  . pantoprazole (PROTONIX) 40 MG tablet TAKE ONE TABLET BY MOUTH ONCE DAILY  . rivaroxaban (XARELTO) 20 MG TABS tablet Take 1 tablet (20 mg total) by mouth daily.  . sertraline (ZOLOFT) 25 MG tablet Take 1 tablet (25 mg total) by mouth daily.  [name] Kitchen zinc oxide (BALMEX) 11.3 % CREA cream Apply 1 application topically daily as needed. Apply to rectum    FAMILY HISTORY:  Her has no family status information on file.   SOCIAL HISTORY: She  reports that she has never smoked. She has never used smokeless tobacco. She reports that she does not drink alcohol or use illicit drugs.  REVIEW OF SYSTEMS:   Unable to obtain as pt is encephalopathic.  SUBJECTIVE:  On vent, follows basic commands.  VITAL SIGNS: BP 148/91 mmHg  Pulse 82  Temp(Src) 99 F (37.2 C) (Oral)  Resp 18  SpO2 100%  LMP 06/06/2015 (Approximate)  HEMODYNAMICS:    VENTILATOR SETTINGS: Vent Mode:  [-]  FiO2 (%):  [50 %]  50 %  INTAKE / OUTPUT:     PHYSICAL EXAMINATION: General: Obese female, in NAD. Neuro: Sedated, follows basic commands.  Unable to move LE's. HEENT: [place]/AT. PERRL, sclerae anicteric. Cardiovascular: RRR, no M/R/G.  Lungs: Respirations even and unlabored.  CTA bilaterally, No W/R/R. Abdomen: BS x 4, soft, NT/ND.  Musculoskeletal: No gross deformities, no edema.  Skin: Intact, warm, no rashes.  LABS:  BMET  Recent Labs Lab 07/05/15 1707  07/05/15 2349 07/06/15 0059  NA 139 136 134*  K 4.1 3.5 4.4  CL 106  --   --   CO2 26  --   --   BUN 15  --   --   CREATININE 1.06*  --   --   GLUCOSE 96  --   --     Electrolytes  Recent Labs Lab 07/05/15 1707  CALCIUM 8.5*    CBC  Recent Labs Lab 07/05/15 1707 07/05/15 2349 07/06/15 0059  WBC 4.8  --   --   HGB 10.2* 7.8* 11.9*  HCT 30.9* 23.0* 35.0*  PLT 203  --   --     Coag's  Recent Labs Lab 07/05/15 2058  APTT 37  INR 1.45    Sepsis Markers No results for input(s): LATICACIDVEN, PROCALCITON, O2SATVEN in the last 168 hours.  ABG  Recent Labs Lab 07/05/15 2349 07/06/15 0059  PHART 7.407 7.374  PCO2ART 35.9 41.3  PO2ART 244.0* 243.0*    Liver Enzymes  Recent Labs Lab 07/05/15 1707  AST 17  ALT 11*  ALKPHOS 94  BILITOT 1.3*  ALBUMIN 3.4*    Cardiac Enzymes No results for input(s): TROPONINI, PROBNP in the last 168 hours.  Glucose No results for input(s): GLUCAP in the last 168 hours.  Imaging Mr Thoracic Spine W Wo Contrast  07/05/2015  ADDENDUM REPORT: 07/05/2015 18:58 ADDENDUM: Critical Value/emergent results were called by telephone at the time of interpretation on 07/05/2015 at 1711 hours to ER provider Alvie Heidelberg, [facility], who verbally acknowledged these results. Electronically Signed   By: Genevie Ann M.D.   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 T3 pathologic fracture treated with surgery including fusion from T1-T5. Worsening lower extremity weakness for 3 weeks. Subsequent encounter. EXAM: MRI THORACIC AND LUMBAR SPINE WITHOUT AND WITH CONTRAST TECHNIQUE: Multiplanar and multiecho pulse sequences of the thoracic and lumbar spine were obtained without and with intravenous contrast. CONTRAST:  1m MULTIHANCE GADOBENATE DIMEGLUMINE 529 MG/ML IV SOLN COMPARISON:  Thoracic spine MRI 12/14/2013. PET-CT 12/22/2014. Lumbar MRI 12/10/2013. Cervical spine MRI  12/14/2013 FINDINGS: MR THORACIC SPINE FINDINGS Limited sagittal imaging of the cervical spine today appears grossly stable to that in 2015. Pathologic T3 compression fracture re- demonstrated with bulky epidural tumor resulting in moderate to severe cord compression (series 8, image 9). This epidural tumor is chronic but progressed compared to 2015 (series 1000, image 8 on 12/14/2013). Evidence of associated cord edema just below the maximal compressive level at T3-T4 (series 8, image 13 and series 7, image 8). Hardware susceptibility artifact in this region, no abnormal spinal cord or intradural enhancement identified. Excluding T12 (see below) no other thoracic vertebral metastatic disease. Stable thoracic vertebral height and alignment elsewhere. No posterior rib metastasis identified. Chronic but increased thoracic epidural lipomatosis. When combined with a chronic degenerative T7-T8 disc herniation (series 8, image 23) there is increased, now mild to moderate degenerative spinal stenosis without associated cord signal abnormality. Smaller chronic left paracentral  disc protrusion at T8-T9. No other thoracic spinal stenosis. The conus medullaris appears normal at T12-L1. Posterior paraspinal soft tissues are within normal limits. MR LUMBAR SPINE FINDINGS Normal lumbar segmentation, concordant with the thoracic numbering. New decreased T1 marrow signal with enhancement in the anterior T12, L1, L2 and L3 vertebrae. Similar findings at the anterior superior S1 vertebral body. There does not appear to be associated interval disc degeneration at these levels to explain the marrow signal changes on the basis of degenerative etiology. Visible sacral ala and medial iliac bones remain normal. No associated pathologic fracture. Visualized lower thoracic spinal cord is normal with conus medularis at L1-L2. No abnormal intradural enhancement. Negative visualized abdominal viscera. Increased lumbar epidural lipomatosis in  conjunction with progressed L3 through S1 disc, endplate, and facet degeneration. Subsequent new moderate to severe degenerative etiology spinal stenosis at: L3-L4 (series 16, image 22), L4-L5 (series 16, image 27), and L5-S1 (greater on the left, series 16, image 33). There is degenerative appearing mild soft tissue inflammation about the left L4-L5 facet joint. IMPRESSION: 1. Chronic T3 pathologic fracture with chronic but increased bulky epidural tumor and progressed Severe Malignant Cord Compression. Associated mild spinal cord edema. 2. New abnormal marrow signal along the anterior vertebral bodies T12 through L3 most compatible with new osseous metastatic disease. Similar finding in the S1 vertebra. No associated pathologic fracture or epidural tumor at these levels. 3. No other thoracic metastatic disease identified. 4. Superimposed progressed degenerative mid thoracic and lower lumbar spinal stenosis related to disc disease, epidural lipomatosis, and posterior element degeneration. Electronically Signed: By: Genevie Ann M.D. On: 07/05/2015 17:08   Mr Lumbar Spine W Wo Contrast  07/05/2015  ADDENDUM REPORT: 07/05/2015 18:58 ADDENDUM: Critical Value/emergent results were called by telephone at the time of interpretation on 07/05/2015 at 1711 hours to ER provider Alvie Heidelberg, [facility], who verbally acknowledged these results. Electronically Signed   By: Genevie Ann M.D.   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 T3 pathologic fracture treated with surgery including fusion from T1-T5. Worsening lower extremity weakness for 3 weeks. Subsequent encounter. EXAM: MRI THORACIC AND LUMBAR SPINE WITHOUT AND WITH CONTRAST TECHNIQUE: Multiplanar and multiecho pulse sequences of the thoracic and lumbar spine were obtained without and with intravenous contrast. CONTRAST:  61m MULTIHANCE GADOBENATE DIMEGLUMINE 529 MG/ML IV SOLN COMPARISON:  Thoracic  spine MRI 12/14/2013. PET-CT 12/22/2014. Lumbar MRI 12/10/2013. Cervical spine MRI 12/14/2013 FINDINGS: MR THORACIC SPINE FINDINGS Limited sagittal imaging of the cervical spine today appears grossly stable to that in 2015. Pathologic T3 compression fracture re- demonstrated with bulky epidural tumor resulting in moderate to severe cord compression (series 8, image 9). This epidural tumor is chronic but progressed compared to 2015 (series 1000, image 8 on 12/14/2013). Evidence of associated cord edema just below the maximal compressive level at T3-T4 (series 8, image 13 and series 7, image 8). Hardware susceptibility artifact in this region, no abnormal spinal cord or intradural enhancement identified. Excluding T12 (see below) no other thoracic vertebral metastatic disease. Stable thoracic vertebral height and alignment elsewhere. No posterior rib metastasis identified. Chronic but increased thoracic epidural lipomatosis. When combined with a chronic degenerative T7-T8 disc herniation (series 8, image 23) there is increased, now mild to moderate degenerative spinal stenosis without associated cord signal abnormality. Smaller chronic left paracentral disc protrusion at T8-T9. No other thoracic spinal stenosis. The conus medullaris appears normal at T12-L1. Posterior paraspinal soft tissues are within  normal limits. MR LUMBAR SPINE FINDINGS Normal lumbar segmentation, concordant with the thoracic numbering. New decreased T1 marrow signal with enhancement in the anterior T12, L1, L2 and L3 vertebrae. Similar findings at the anterior superior S1 vertebral body. There does not appear to be associated interval disc degeneration at these levels to explain the marrow signal changes on the basis of degenerative etiology. Visible sacral ala and medial iliac bones remain normal. No associated pathologic fracture. Visualized lower thoracic spinal cord is normal with conus medularis at L1-L2. No abnormal intradural enhancement.  Negative visualized abdominal viscera. Increased lumbar epidural lipomatosis in conjunction with progressed L3 through S1 disc, endplate, and facet degeneration. Subsequent new moderate to severe degenerative etiology spinal stenosis at: L3-L4 (series 16, image 22), L4-L5 (series 16, image 27), and L5-S1 (greater on the left, series 16, image 33). There is degenerative appearing mild soft tissue inflammation about the left L4-L5 facet joint. IMPRESSION: 1. Chronic T3 pathologic fracture with chronic but increased bulky epidural tumor and progressed Severe Malignant Cord Compression. Associated mild spinal cord edema. 2. New abnormal marrow signal along the anterior vertebral bodies T12 through L3 most compatible with new osseous metastatic disease. Similar finding in the S1 vertebra. No associated pathologic fracture or epidural tumor at these levels. 3. No other thoracic metastatic disease identified. 4. Superimposed progressed degenerative mid thoracic and lower lumbar spinal stenosis related to disc disease, epidural lipomatosis, and posterior element degeneration. Electronically Signed: By: Genevie Ann M.D. On: 07/05/2015 17:08     STUDIES:  MRI T / L spine 04/13 > chronic T3 pathologic fx with chronic but increased bulky epidural tumor and progressed severe malignant cord compression with associated spinal cord edema.  New osseous metastatic disease T12 through L3. Port CXR 4/14 > Endotracheal tube 2 cm above carina. Low lung volumes. No focal opacity.  CULTURES: None.  ANTIBIOTICS: None.  SIGNIFICANT EVENTS: 04/13 > admitted with recurrent spinal tumor (hx metastatic thyroid CA) and taken to OR for emergent thoracic decompression, returned to ICU on the vent.  LINES/TUBES: ETT 7.5 04/14 > R Rad Art Line 4/13 > Foley 4/13 > PIV x3  DISCUSSION: 49 y.o. F with hx metastatic thyroid CA s/p total thyroidectomy and rad onc 2015 as well as decompression laminectomy due to spinal tumor 2015,  admitted 04/13 with recurrent spinal tumor.  She was taken to the OR for emergent decompression.  ASSESSMENT / PLAN:  NEUROLOGIC A:   Hx spinal tumor secondary to metastatic thyroid CA - s/p decompression laminectomy 2015.  Now with tumor recurrence for which she was taken to the OR for emergent decompression 07/05/15. Acute encephalopathy - due to sedation. Hx DDD, DJD, chronic back pain. P:   Post op care per neurosurgery. Sedation: Propofol / Fentanyl PRN. RASS goal: 0 to -1. Daily WUA. Hold baclofen, morphine, percocet, valium, until after extubation.  PULMONARY A: VDRF - following emergent surgery for spinal tumor decompression. P:   Full vent support. Wean as able. SBT in AM with hopes of extubation. Pulmonary hygiene. CXR in AM.  CARDIOVASCULAR A:  Hx HTN. P:  Hold outpatient lisinopril. Monitor hemodynamics.  RENAL A:   AKI. Hypocalcemia. P:   NS @ 100. 1g Ca gluconate. BMP in AM.  GASTROINTESTINAL A:   GI prophylaxis. Nutrition. P:   SUP: Protonix IV. NPO for now.  HEMATOLOGIC / ONCOLOGIC. A:   Hx metastatic thyroid CA - s/p total thyroidectomy and radiation oncology. On chronic anticoagulation. Mild anemia. VTE prophylaxis. P:  Oncology contacted  by Dr. Christella Noa. Defer timing on resuming anticoagulation to neurosurgery. Transfuse for hgb < 7. SCD's. CBC in AM.  INFECTIOUS A:   No indication of infection. P:   Monitor clinically.  ENDOCRINE A:   Hyperglycemia - No h/o DM. Hx total thyroidectomy - due to metastatic thyroid CA.  P:   Continue outpatient synthroid, change to IV formulation. SSI per algorithm Accu-Checks q4hr while NPO Hgb A1c   Family updated: None.  Interdisciplinary Family Meeting v Palliative Care Meeting:  Due by: 07/12/15.  CC time: 35 minutes.   Montey Hora, [facility] Pager: [phone]  or [phone] 07/06/2015, 2:19 AM   PCCM Attending  Note: Patient seen and examined this morning. I have reviewed positions assistant consultation note. Patient status post emergent cervical decompression. This morning she appears comfortable. Status post 3 units of packed red blood cells. I have ordered repeat CBC and coags. Plan for spontaneous breathing trial this morning and probable extubation. Continuing IV Synthroid. Continuing to trend electrolytes daily. If patient unable to be extubated will require placement of NG tube for enteric feeding and/or medications. Checking hemoglobin A1c. Patient's hyperglycemia & starting sliding scale insulin with Accu-Cheks every 4 hours.  I have spent a total of 31 minutes of critical care time today caring for the patient and reviewing the patient's electronic medical record.  Sonia Baller Ashok Cordia, M.D. [facility] Pager:  [phone] After 3pm or if no response, call [phone] 6:35 AM 07/06/2015

## 2015-07-06 NOTE — Transfer of Care (Signed)
Immediate Anesthesia Transfer of Care Note  Patient: Sarah [name]  Procedure(s) Performed: Procedure(s): THORACIC LAMINECTOMY FOR TUMOR (N/A)  Patient Location: PACU  Anesthesia Type:General  Level of Consciousness: Patient remains intubated per anesthesia plan  Airway & Oxygen Therapy: Patient remains intubated per anesthesia plan and Patient placed on Ventilator (see vital sign flow sheet for setting)  Post-op Assessment: Report given to RN and Post -op Vital signs reviewed and stable  Post vital signs: Reviewed and stable  Last Vitals:  Filed Vitals:   07/05/15 2011 07/05/15 2133  BP: 134/80 137/77  Pulse: 86 86  Temp:    Resp: 18 18    Complications: No apparent anesthesia complications

## 2015-07-06 NOTE — Progress Notes (Signed)
RT instructed patient on use of incentive spirometer. Patient able to get 1500 mls repeatedly.

## 2015-07-06 NOTE — Evaluation (Signed)
Physical Therapy Evaluation Patient Details Name: Sarah [name] MRN: [id number] DOB: [birth year] Today's Date: 07/06/2015   History of Present Illness  pt presents after a decline in motor function and was found to have reoccurance of T3 Tumor now s/p T3 Lami with Tumor Resection.  pt with hx of Metastatic Thyroid CA, T3 Tumor Resection 12/2013, and HTN.    Clinical Impression  Pt very motivated, but frustrated that she feels like she is starting back at the beginning again.  Pt well known to this PT and feel she would be a great candidate for CIR to maximize independence and decrease burden of care prior to returning to home with family.      Follow Up Recommendations CIR    Equipment Recommendations  None recommended by PT    Recommendations for Other Services Rehab consult     Precautions / Restrictions Precautions Precautions: Back;Fall Precaution Booklet Issued: No Restrictions Weight Bearing Restrictions: No      Mobility  Bed Mobility Overal bed mobility: Needs Assistance;+2 for physical assistance Bed Mobility: Rolling;Sidelying to Sit;Sit to Sidelying Rolling: Mod assist;+2 for physical assistance Sidelying to sit: Total assist;+2 for physical assistance     Sit to sidelying: Mod assist;+2 for physical assistance General bed mobility comments: Extensive A for coming to sitting due to need for total A with decreasing spasticity in Bil LEs and maintaining positioning of Bil LEs.  pt much improved ability to A with returning to bed with decreased spasticity.    Transfers                    Ambulation/Gait                Stairs            Wheelchair Mobility    Modified Rankin (Stroke Patients Only)       Balance Overall balance assessment: Needs assistance Sitting-balance support: Bilateral upper extremity supported;Feet supported Sitting balance-Leahy Scale: Poor Sitting balance - Comments: fluctuated between ModA and MinG while  sitting EOB ~18 mins.  pt worked on prop on bil elbows, posture, and maintaining balance.                                       Pertinent Vitals/Pain Pain Assessment: Faces Faces Pain Scale: Hurts little more Pain Location: Back pt would state "It's not bad." Pain Descriptors / Indicators: Grimacing Pain Intervention(s): Monitored during session;Premedicated before session;Repositioned    Home Living Family/patient expects to be discharged to:: Inpatient rehab                      Prior Function Level of Independence: Needs assistance   Gait / Transfers Assistance Needed: pt was slide board transfering and scooting to drop arm with MinG prior to decline.    ADL's / Homemaking Assistance Needed: Performed bed level baths and dressed herself.  Family performed homemaking tasks.          Hand Dominance        Extremity/Trunk Assessment   Upper Extremity Assessment: Defer to OT evaluation           Lower Extremity Assessment: RLE deficits/detail;LLE deficits/detail RLE Deficits / Details: Chronic increased spasticity, impaired soft touch, deep pressure, and pain sensation.  Strength grossly 2-/5 LLE Deficits / Details: Chronic increased spasticity, impaired soft touch, deep pressure, and pain sensation.  Strength  grossly 2-/5.  Cervical / Trunk Assessment: Kyphotic  Communication   Communication: No difficulties  Cognition Arousal/Alertness: Awake/alert Behavior During Therapy: WFL for tasks assessed/performed Overall Cognitive Status: Within Functional Limits for tasks assessed                      General Comments      Exercises        Assessment/Plan    PT Assessment Patient needs continued PT services  PT Diagnosis Difficulty walking;Generalized weakness   PT Problem List Decreased strength;Decreased activity tolerance;Decreased balance;Decreased mobility;Decreased coordination;Decreased knowledge of use of  DME;Cardiopulmonary status limiting activity;Impaired sensation;Impaired tone;Pain;Obesity  PT Treatment Interventions DME instruction;Functional mobility training;Therapeutic activities;Therapeutic exercise;Balance training;Neuromuscular re-education;Patient/family education   PT Goals (Current goals can be found in the Care Plan section) Acute Rehab PT Goals Patient Stated Goal: Be able to do things for myself. PT Goal Formulation: With patient Time For Goal Achievement: 07/20/15 Potential to Achieve Goals: Good    Frequency Min 3X/week   Barriers to discharge        Co-evaluation PT/OT/SLP Co-Evaluation/Treatment: Yes Reason for Co-Treatment: Complexity of the patient's impairments (multi-system involvement);For patient/therapist safety PT goals addressed during session: Mobility/safety with mobility;Balance         End of Session Equipment Utilized During Treatment: Oxygen Activity Tolerance: Patient tolerated treatment well Patient left: in bed;with call bell/phone within reach;with family/visitor present Nurse Communication: Mobility status         Time: 4503-8882 PT Time Calculation (min) (ACUTE ONLY): 35 min   Charges:   PT Evaluation $PT Eval Moderate Complexity: 1 Procedure     PT G CodesCatarina Hartshorn, [place] 07/06/2015, 3:38 PM

## 2015-07-06 NOTE — Procedures (Signed)
Extubation Procedure Note  Patient Details:   Name: Sarah [name] DOB: [birth year] MRN: [id number]   Airway Documentation:     Evaluation  O2 sats: stable throughout Complications: No apparent complications Patient did tolerate procedure well. Bilateral Breath Sounds: Clear, Diminished   Yes   Positive cuff leak noted prior to extubation.  Patient placed on [place] 4 Lpm with humidity, no stridor, E-link notified. Rt will continue to monitor.  Mingo Amber Rohit Deloria 07/06/2015, 10:06 AM

## 2015-07-06 NOTE — Progress Notes (Signed)
RT assisted with transporting vent from 2M13 to 3M05 without any complications.

## 2015-07-07 DIAGNOSIS — J988 Other specified respiratory disorders: Secondary | ICD-10-CM

## 2015-07-07 DIAGNOSIS — Z86711 Personal history of pulmonary embolism: Secondary | ICD-10-CM

## 2015-07-07 LAB — GLUCOSE, CAPILLARY
GLUCOSE-CAPILLARY: 142 mg/dL — AB (ref 65–99)
GLUCOSE-CAPILLARY: 130 mg/dL — AB (ref 65–99)
GLUCOSE-CAPILLARY: 142 mg/dL — AB (ref 65–99)
Glucose-Capillary: 138 mg/dL — ABNORMAL HIGH (ref 65–99)
Glucose-Capillary: 142 mg/dL — ABNORMAL HIGH (ref 65–99)

## 2015-07-07 LAB — RENAL FUNCTION PANEL
ALBUMIN: 2.7 g/dL — AB (ref 3.5–5.0)
ANION GAP: 12 (ref 5–15)
BUN: 17 mg/dL (ref 6–20)
CHLORIDE: 104 mmol/L (ref 101–111)
CO2: 23 mmol/L (ref 22–32)
Calcium: 8.2 mg/dL — ABNORMAL LOW (ref 8.9–10.3)
Creatinine, Ser: 1.08 mg/dL — ABNORMAL HIGH (ref 0.44–1.00)
GFR calc Af Amer: >60 mL/min (ref >60)
GFR, EST NON AFRICAN AMERICAN: 59 mL/min — AB (ref >60)
Glucose, Bld: 156 mg/dL — ABNORMAL HIGH (ref 65–99)
PHOSPHORUS: 4.1 mg/dL (ref 2.5–4.6)
POTASSIUM: 4.3 mmol/L (ref 3.5–5.1)
Sodium: 139 mmol/L (ref 135–145)

## 2015-07-07 LAB — CBC
HEMATOCRIT: 31 % — AB (ref 36.0–46.0)
Hemoglobin: 10.9 g/dL — ABNORMAL LOW (ref 12.0–15.0)
MCH: 29.8 pg (ref 26.0–34.0)
MCHC: 35.2 g/dL (ref 30.0–36.0)
MCV: 84.7 fL (ref 78.0–100.0)
Platelets: 207 10*3/uL (ref 150–400)
RBC: 3.66 MIL/uL — AB (ref 3.87–5.11)
RDW: 13.3 % (ref 11.5–15.5)
WBC: 10.8 10*3/uL — AB (ref 4.0–10.5)

## 2015-07-07 LAB — HEMOGLOBIN A1C
HEMOGLOBIN A1C: 5.7 % — AB (ref 4.8–5.6)
MEAN PLASMA GLUCOSE: 117 mg/dL

## 2015-07-07 LAB — MAGNESIUM: Magnesium: 2 mg/dL (ref 1.7–2.4)

## 2015-07-07 NOTE — Progress Notes (Signed)
Upon rn assessment, +1 strength in L foot, and no response to painful stimuli. Think this is change from earlier today, rn was given in report that pt has BIL +2 strength and BIL response to painful stimuli. md paged and made aware.

## 2015-07-07 NOTE — Progress Notes (Signed)
Pt arrived and oriented to unit. Family at bedside.

## 2015-07-07 NOTE — Progress Notes (Signed)
PULMONARY / CRITICAL CARE MEDICINE   Name: Sarah [name] MRN: [id number] DOB: [birth year]    ADMISSION DATE:  07/05/2015 CONSULTATION DATE:  07/06/2015  REFERRING MD:  Christella Noa  CHIEF COMPLAINT:  Leg weakness  SUBJECTIVE:  Having trouble with muscle spasms.  VITAL SIGNS: BP 111/63 mmHg  Pulse 68  Temp(Src) 98.3 F (36.8 C) (Oral)  Resp 16  Ht '5\' 10"'$  (1.778 m)  Wt 258 lb 9.6 oz (117.3 kg)  BMI 37.11 kg/m2  SpO2 97%  LMP 06/06/2015 (Approximate)  INTAKE / OUTPUT: I/O last 3 completed shifts: In: 8563 [P.O.:1300; I.V.:3145; Blood:1005; IV Piggyback:100] Out: 1497 [Urine:3105; Blood:625]  PHYSICAL EXAMINATION: General:  alert Neuro:  Follows commands HEENT:  No stridor Cardiovascular:  regular Lungs:  No wheeze Abdomen:  Soft, non tender Musculoskeletal:  No edema Skin:  No rashes  LABS:  BMET  Recent Labs Lab 07/05/15 1707  07/06/15 0059 07/06/15 0400 07/07/15 0555  NA 139  < > 134* 136 139  K 4.1  < > 4.4 4.1 4.3  CL 106  --   --  103 104  CO2 26  --   --  22 23  BUN 15  --   --  13 17  CREATININE 1.06*  --   --  1.05* 1.08*  GLUCOSE 96  --   --  182* 156*  < > = values in this interval not displayed.  Electrolytes  Recent Labs Lab 07/05/15 1707 07/06/15 0400 07/06/15 0700 07/07/15 0555  CALCIUM 8.5* 8.7*  --  8.2*  MG  --   --  1.7 2.0  PHOS  --   --  3.8 4.1    CBC  Recent Labs Lab 07/05/15 1707  07/06/15 0059 07/06/15 0700 07/07/15 0555  WBC 4.8  --   --  5.7 10.8*  HGB 10.2*  < > 11.9* 11.8* 10.9*  HCT 30.9*  < > 35.0* 33.7* 31.0*  PLT 203  --   --  167 207  < > = values in this interval not displayed.  Coag's  Recent Labs Lab 07/05/15 2058 07/06/15 0700  APTT 37 32  INR 1.45 1.33    Sepsis Markers No results for input(s): LATICACIDVEN, PROCALCITON, O2SATVEN in the last 168 hours.  ABG  Recent Labs Lab 07/05/15 2349 07/06/15 0059  PHART 7.407 7.374  PCO2ART 35.9 41.3  PO2ART 244.0* 243.0*    Liver  Enzymes  Recent Labs Lab 07/05/15 1707 07/07/15 0555  AST 17  --   ALT 11*  --   ALKPHOS 94  --   BILITOT 1.3*  --   ALBUMIN 3.4* 2.7*    Cardiac Enzymes No results for input(s): TROPONINI, PROBNP in the last 168 hours.  Glucose  Recent Labs Lab 07/06/15 1154 07/06/15 1535 07/06/15 1926 07/06/15 2324 07/07/15 0353 07/07/15 0756  GLUCAP 152* 141* 140* 147* 142* 142*    Imaging No results found.   STUDIES:  4/13 MRI T spine >> T3 pathologic fx, new osseous mets to T12 through L3/S1  CULTURES:  ANTIBIOTICS:  SIGNIFICANT EVENTS: 4/13 Admit, T3 laminectomy with resection of tumor  LINES/TUBES: 4/13 ETT >> 4/14  4/13 Rt radial aline >> 4/14  DISCUSSION: 50 yo female with progressive leg weakness 2nd to T3 tumor from metastatic thyroid cancer with cord compression.  Returned from surgery on vent and PCCM consulted.  ASSESSMENT / PLAN:  PULMONARY A: Compromised airway after surgery. P:   Bronchial hygiene  CARDIOVASCULAR A:  Hx of HTN. Hx  of PE. P:  Hold outpt lisinopril for now >> likely can resume soon Hold outpt xarelto until okay to resume per neurosurgery  RENAL A:   Urine incontinence prior to surgery. P:   Keep foley in for now >> defer to neurosurgery  GASTROINTESTINAL A:   Hx of GERD. P:   Continue protonix  HEMATOLOGIC A:   Hx of thyroid cancer with mets to T, L, and S spine. P:  Per primary team  INFECTIOUS A:   No evidence for infection. P:   Monitor clinically  ENDOCRINE A:   Hx of hypothyroidism.  P:   Continue synthroid  NEUROLOGIC A:   T3 metastatic lesion with cord compression s/p laminectomy. Chronic pain. Hx of depression. P:   Per neurosurgery  Okay to transfer out of ICU from PCCM standpoint.  PCCM will sign off >> please call if additional help needed.   Chesley Mires, MD [facility] Pulmonary/Critical Care 07/07/2015, 10:07 AM Pager:  [phone] After 3pm call: [phone]

## 2015-07-07 NOTE — Progress Notes (Signed)
Patient ID: Sarah [name], female   DOB: November 17, 1965, 50 y.o.   MRN: [id number] No change in exam. Can move feet. Some strength in the proximal lower extremities appears to be about 2 out of 5. She has some numbness in her legs. Having some more spasm-like pain tonight. Awake and alert and pleasant and interactive.

## 2015-07-08 LAB — GLUCOSE, CAPILLARY
GLUCOSE-CAPILLARY: 118 mg/dL — AB (ref 65–99)
GLUCOSE-CAPILLARY: 133 mg/dL — AB (ref 65–99)
GLUCOSE-CAPILLARY: 162 mg/dL — AB (ref 65–99)
GLUCOSE-CAPILLARY: 168 mg/dL — AB (ref 65–99)
Glucose-Capillary: 162 mg/dL — ABNORMAL HIGH (ref 65–99)
Glucose-Capillary: 148 mg/dL — ABNORMAL HIGH (ref 65–99)
Glucose-Capillary: 131 mg/dL — ABNORMAL HIGH (ref 65–99)

## 2015-07-08 MED ORDER — PANTOPRAZOLE SODIUM 40 MG PO TBEC
40.0000 mg | DELAYED_RELEASE_TABLET | Freq: Every day | ORAL | Status: DC
  Administered 2015-07-09 – 2015-07-11 (×3): 40 mg via ORAL
  Filled 2015-07-08 (×3): qty 1

## 2015-07-08 MED ORDER — BACLOFEN 10 MG PO TABS
20.0000 mg | ORAL_TABLET | Freq: Every day | ORAL | Status: DC
  Administered 2015-07-08 – 2015-07-11 (×16): 20 mg via ORAL
  Filled 2015-07-08 (×16): qty 2

## 2015-07-08 MED ORDER — LEVOTHYROXINE SODIUM 100 MCG PO TABS
200.0000 ug | ORAL_TABLET | Freq: Every day | ORAL | Status: DC
  Administered 2015-07-09 – 2015-07-11 (×3): 200 ug via ORAL
  Filled 2015-07-08 (×3): qty 2

## 2015-07-08 NOTE — Progress Notes (Signed)
Patient's pain controlled. Still having a great deal difficulty with spasms into her lower extremities. Spirits good.  Afebrile. Vitals are stable. Motor and sensory function of her upper extremities normal. Motor examination of her lower extremities limited by spasms. She does have voluntary plantarflexion in her right foot and some slight plantarflexion in her left foot. No proximal movement noted. Dressing clean and dry. Abdomen soft.  Overall stable. Continue current management.

## 2015-07-09 ENCOUNTER — Inpatient Hospital Stay (HOSPITAL_COMMUNITY): Payer: BLUE CROSS/BLUE SHIELD

## 2015-07-09 ENCOUNTER — Ambulatory Visit: Payer: BLUE CROSS/BLUE SHIELD | Admitting: Physical Therapy

## 2015-07-09 LAB — TYPE AND SCREEN
ABO/RH(D): O POS
Antibody Screen: NEGATIVE
UNIT DIVISION: 0
UNIT DIVISION: 0
UNIT DIVISION: 0
Unit division: 0
Unit division: 0
Unit division: 0

## 2015-07-09 LAB — GLUCOSE, CAPILLARY
GLUCOSE-CAPILLARY: 130 mg/dL — AB (ref 65–99)
GLUCOSE-CAPILLARY: 121 mg/dL — AB (ref 65–99)
GLUCOSE-CAPILLARY: 127 mg/dL — AB (ref 65–99)
GLUCOSE-CAPILLARY: 121 mg/dL — AB (ref 65–99)
Glucose-Capillary: 174 mg/dL — ABNORMAL HIGH (ref 65–99)

## 2015-07-09 MED ORDER — RIVAROXABAN 20 MG PO TABS
20.0000 mg | ORAL_TABLET | Freq: Every day | ORAL | Status: DC
  Administered 2015-07-10: 20 mg via ORAL
  Filled 2015-07-09: qty 1

## 2015-07-09 MED ORDER — DEXAMETHASONE 4 MG PO TABS
4.0000 mg | ORAL_TABLET | Freq: Two times a day (BID) | ORAL | Status: DC
  Administered 2015-07-10 – 2015-07-11 (×3): 4 mg via ORAL
  Filled 2015-07-09 (×3): qty 1

## 2015-07-09 MED ORDER — DEXAMETHASONE SODIUM PHOSPHATE 4 MG/ML IJ SOLN: 4.0000 mg | Freq: Two times a day (BID) | INTRAMUSCULAR | Status: DC

## 2015-07-09 NOTE — Progress Notes (Addendum)
Pt reports hx of some difficulty swallowing and has to take small bites.   Rn in room talking with pt, pt eating her breakfast. Pt took medications easily. Then pt ate piece of cantaloupe, size of quarter, estimated 1x1inch. Pt then started coughing and saying she felt like she was "choking". Pt started dry heaving. Rn assisted pt to get repositioned higher in the bed and sitting up. Pt dry heaved up spit (not thin liquid)  several times. Then pt vomitted x2 small amount of clear thin liquid.  Pt believes this "choking episode" was from the cantaloupe, not the pills she had taken. Pt takes pills 1 at a time. Pt took 3 pills total.   Pt says she had chewed up the cantaloupe before swallowing.  Pt continuing to be distressed, air way patent, able to speak.  Upper lung sounds clear, no food seen in mouth.    Pt reports she still feels choked. rn has made pt NPO until md made aware and advises.   Suction and oxygen set up at bedside. Pt on continuous pulse ox. Alert and oriented x4.   Paged and made md aware. Speech therapy consult ordered.

## 2015-07-09 NOTE — Progress Notes (Signed)
Physical Therapy Treatment Patient Details Name: Sarah [name] MRN: [id number] DOB: 03-17-1966 Today's Date: 07/09/2015    History of Present Illness pt presents after a decline in motor function and was found to have reoccurance of T3 Tumor now s/p T3 Lami with Tumor Resection.  pt with hx of Metastatic Thyroid CA, T3 Tumor Resection 12/2013, and HTN.      PT Comments    Sarah [name] was very willing to work w/ therapy and remains motivated to regain her independence.  She currently requires up to max assist for bed mobility due to spasticity in Bil LEs.  Attempted sit>stand; however, regular stedy too narrow.  Will attempt sit>stand using bari stedy at next session as appropriate.  She remains excellent candidate for CIR.   Follow Up Recommendations  CIR     Equipment Recommendations  None recommended by PT    Recommendations for Other Services Rehab consult     Precautions / Restrictions Precautions Precautions: Back;Fall Precaution Comments: Reviewed and performed log roll technique for supine<>sit Restrictions Weight Bearing Restrictions: No    Mobility  Bed Mobility Overal bed mobility: Needs Assistance Bed Mobility: Rolling;Sidelying to Sit;Sit to Sidelying Rolling: Mod assist Sidelying to sit: Max assist;+2 for physical assistance     Sit to sidelying: Max assist;+2 for physical assistance General bed mobility comments: Cues for log rolling technique.  PROM to bend Rt knee for ease of rolling to the Lt.  Max assist to support trunk and manage Bil LEs w/ supine<>sit.  Blocked Bil knees during sidelying>sit to prevent knee extension Bil.  Transfers Overall transfer level: Needs assistance Equipment used:  (lift equipment) Transfers: Sit to/from Stand Sit to Stand: From elevated surface;Max assist;+2 physical assistance         General transfer comment: Attempted to perform sit>stand using stedy and use of bed pad to boost; however, regular stedy to narrow and will  require bari stedy next session.  Able to barely clear buttocks from bed w/ +2 assist and using Bil UEs.  Ambulation/Gait                 Stairs            Wheelchair Mobility    Modified Rankin (Stroke Patients Only)       Balance Overall balance assessment: Needs assistance Sitting-balance support: Bilateral upper extremity supported;Feet supported Sitting balance-Leahy Scale: Poor Sitting balance - Comments: Fluctuated between min guard assist>min assist.  Very kyphotic and required cues for upright posture Postural control: Posterior lean                          Cognition Arousal/Alertness: Awake/alert Behavior During Therapy: WFL for tasks assessed/performed (tearful) Overall Cognitive Status: Within Functional Limits for tasks assessed                      Exercises General Exercises - Lower Extremity Ankle Circles/Pumps: PROM;Both;5 reps;Seated Long Arc Quad: PROM;Both;10 reps;Seated    General Comments General comments (skin integrity, edema, etc.): Decreased Bil knee extension spasticity sitting EOB and pt tolerated PROM exercises well sitting EOB.      Pertinent Vitals/Pain Pain Assessment: No/denies pain Faces Pain Scale: Hurts a little bit Pain Location: BLE spasticity Pain Descriptors / Indicators: Spasm Pain Intervention(s): Limited activity within patient's tolerance;Monitored during session    Home Living  Prior Function            PT Goals (current goals can now be found in the care plan section) Acute Rehab PT Goals Patient Stated Goal: to regain independence  Progress towards PT goals: Progressing toward goals    Frequency  Min 3X/week    PT Plan Current plan remains appropriate    Co-evaluation             End of Session   Activity Tolerance: Patient limited by fatigue;Patient tolerated treatment well Patient left: with call bell/phone within reach;in bed;with bed  alarm set;with family/visitor present     Time: 1216-2446 PT Time Calculation (min) (ACUTE ONLY): 36 min  Charges:  $Therapeutic Exercise: 8-22 mins $Therapeutic Activity: 8-22 mins                    G Codes:      Sarah [name] PT, DPT  Pager: [phone] Phone: [phone] 07/09/2015, 3:11 PM

## 2015-07-09 NOTE — Progress Notes (Addendum)
Inpatient Rehabilitation  I met with the patient to discuss the recommendation for IP Rehab.  Pt. has been to CIR in the past and would like to return to IP Rehab when medically ready.  Discussed bowel and bladder issues with pt. and RN.  Pt. Does not believe she has had a full BM since admission.  RN to follow up with MD.  Will submit clinicals once updated PT and OT notes are available. I have discussed case with Dr. Posey Pronto.  PT goals are for max and mod assist, OT goals are for mod assist.  Estimated LOS is 18-21 days.   Please call if questions.  [place] Admissions Coordinator Cell [phone] Office [phone]

## 2015-07-09 NOTE — Progress Notes (Signed)
Occupational Therapy Treatment Patient Details Name: Sarah [name] MRN: [id number] DOB: 1965/11/11 Today's Date: 07/09/2015    History of present illness pt presents after a decline in motor function and was found to have reoccurance of T3 Tumor now s/p T3 Lami with Tumor Resection.  pt with hx of Metastatic Thyroid CA, T3 Tumor Resection 12/2013, and HTN.     OT comments  Session limited today - xray came to take pt to radiology. Session focused on hygiene after incontinent episode, which is new occurrence for pt. Pt able to roll with max A for moving BLE today with heavy use of rails. Pt states she feels her legs are a" little bit better" than before surgery.  Pt unable to dorsiflex B feet and will benefit from use of PRAFO boots to prevent footdrop. Continue to recommend CIR for rehab. Pt is very motivated to return to PLOF and has a very supportive family.   Follow Up Recommendations  CIR;Supervision/Assistance - 24 hour    Equipment Recommendations  None recommended by OT    Recommendations for Other Services Rehab consult    Precautions / Restrictions Precautions Precautions: Back;Fall       Mobility Bed Mobility Overal bed mobility: Needs Assistance Bed Mobility: Rolling Rolling: Max assist         General bed mobility comments: Rolling with use of handrails and therapist assisting with movement of BLE - spasms during mobility  Transfers                 General transfer comment: unable to attempt today due to short session as pt going to xray    Balance                                   ADL           Upper Body Bathing: Set up   Lower Body Bathing: Maximal assistance;Bed level                         General ADL Comments: Session focused on bed mobility for hygiene after toileting. Radiology came to take pt for scan during session. Pt able to roll to side with max A to assist with BLE. Max A to clean pt. Pt states PTA, she  completed bathing/dressing on [facility] @ set up level and was continenet of bowel/bladder. States she was able to complete a squat pivot transfer and did not use a sliding board. States she had started to ambulate in the parallel bars at therapy and could pull to stand at the sink to help with peri-care. Pt states she feels she has more spasticity in BLE than before surgery. Unable to actively dorsiflex B feet. Educated pt/family on importance of proper positioning and preventing foot drop. Will need PRAFO boots at this time.      Vision                     Perception     Praxis      Cognition   Behavior During Therapy: [facility] for tasks assessed/performed Overall Cognitive Status: Within Functional Limits for tasks assessed                       Extremity/Trunk Assessment     Increased weakness BLE          Exercises  Shoulder Instructions       General Comments      Pertinent Vitals/ Pain       Pain Assessment: Faces Pain Score: 1  Faces Pain Scale: Hurts a little bit Pain Location: BLE spasticity Pain Descriptors / Indicators: Spasm Pain Intervention(s): Limited activity within patient's tolerance;Monitored during session;Repositioned  Home Living                                          Prior Functioning/Environment              Frequency Min 2X/week     Progress Toward Goals  OT Goals(current goals can now be found in the care plan section)  Progress towards OT goals: Progressing toward goals  Acute Rehab OT Goals Patient Stated Goal: to regain independence  OT Goal Formulation: With patient Time For Goal Achievement: 07/20/15 Potential to Achieve Goals: Good ADL Goals Pt Will Perform Lower Body Bathing: with mod assist;bed level Pt Will Perform Upper Body Dressing: with min assist;sitting;bed level Pt Will Perform Lower Body Dressing: with mod assist;bed level;with adaptive equipment Pt Will Transfer to Toilet: with  mod assist;bedside commode  Plan Discharge plan remains appropriate    Co-evaluation                 End of Session     Activity Tolerance Other (comment) (pt limited due to procedure)   Patient Left in bed;with call bell/phone within reach   Nurse Communication Mobility status        Time: 4174-0814 OT Time Calculation (min): 16 min  Charges: OT General Charges $OT Visit: 1 Procedure OT Treatments $Self Care/Home Management : 8-22 mins  Eloise Mula,HILLARY 07/09/2015, 12:29 PM   [facility] [address], OTR/L  [phone] 07/09/2015

## 2015-07-09 NOTE — Evaluation (Signed)
Clinical/Bedside Swallow Evaluation Patient Details  Name: SHALOM WARE MRN: [id number] Date of Birth: 1965/06/16  Today's Date: 07/09/2015 Time: SLP Start Time (ACUTE ONLY): 15 SLP Stop Time (ACUTE ONLY): 0949 SLP Time Calculation (min) (ACUTE ONLY): 34 min  Past Medical History:  Past Medical History  Diagnosis Date  . Hypertension   . DDD (degenerative disc disease), cervical   . DJD (degenerative joint disease)   . Obesity   . Chronic back pain   . 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 History of rectal fissure   . Rotator cuff tendonitis right   Past Surgical History:  Past Surgical History  Procedure Laterality Date  . Tonsillectomy    . Laminectomy N/A 12/14/2013    Procedure: THORACIC LAMINECTOMY FOR TUMOR THORACIC THREE;  Surgeon: Ashok Pall, MD;  Location: [place] NEURO ORS;  Service: Neurosurgery;  Laterality: N/A;  THORACIC LAMINECTOMY FOR TUMOR THORACIC THREE  . Posterior lumbar fusion 4 level N/A 12/30/2013    Procedure: Thoracic one-Thoracic five posterior thoracic fusion with pedicle screws;  Surgeon: Ashok Pall, MD;  Location: [place] NEURO ORS;  Service: Neurosurgery;  Laterality: N/A;  Thoracic one-Thoracic five posterior thoracic fusion with pedicle screws  . Thyroidectomy N/A 01/09/2014    Procedure: TOTAL THYROIDECTOMY;  Surgeon: Izora Gala, MD;  Location: [facility] OR;  Service: ENT;  Laterality: N/A;   HPI:  50 yo female adm to [facility] for surgery on thoracic spinal tumor.  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  Concern for possible aspiration of cantelope noted today during breakfast.  Pt was made NPO and swallow evaluation ordered.  Pt reports some mild chronic dysphagia resulting in sensation of food lodging in throat since thyroid surgery.  She denies recent pna but admits to weight  loss- which she attributes to illnesses.  Prior MBS in July 2016 revealed normal swallow except barium tablet lodging in thoracic region - pt reports she sensed this lodging.     Assessment / Plan / Recommendation Clinical Impression  Pt presents with negative CN exam impacting swallow musculature.  She did not demonstrate any s/s of aspiration with cracker, applesauce and water - swallow was timely with clear voice throughout and no indication of residuals.  Pt does have h/o thyroid cancer s/p surgery and radiation which SLP suspects may contribute to her episodic deficits due to possible ? scar tissue/fibrosis.  Educated pt to findings, compensation strategies, recommended diet modifier to maximize airway protection with intake.  Provided her with lingual press exercise to strengthen submental musculature/laryngeal elevation.  Demonstrated and had pt return demonstration. Pt reports this is first occasion of choking with this admission - and she states this occurs rarely at home.  Pt agreeable to mechanical soft diet at this time.  Reviewed need for pharyngolaryngeal exercises to help with tissue fibrosis from radiation.  Will follow up for skilled dysphagia treatment to maximize pt's swallow function- pt agreeable.      Aspiration Risk  Mild aspiration risk (chronic)    Diet Recommendation Dysphagia 3 (Mech soft);Thin liquid   Liquid Administration via: Cup;Straw Medication Administration: Whole meds with liquid Supervision: Patient able to self feed Compensations: Slow rate;Small sips/bites (start intake with liquids) Postural Changes: Seated upright at 90 degrees;Remain upright for at least 30 minutes after po intake    Other  Recommendations Oral Care Recommendations:  Oral care BID   Follow up Recommendations    tbd   Frequency and Duration min 1 x/week  1 week       Prognosis Prognosis for Safe Diet Advancement: Fair      Swallow Study   General Date of Onset: 07/09/15 HPI: 50  yo female adm to [facility] for surgery on thoracic spinal tumor.  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  Concern for possible aspiration of cantelope noted today during breakfast.  Pt was made NPO and swallow evaluation ordered.  Pt reports some mild chronic dysphagia resulting in sensation of food lodging in throat since thyroid surgery.  She denies recent pna but admits to weight loss- which she attributes to illnesses.   Type of Study: Bedside Swallow Evaluation Diet Prior to this Study: NPO Temperature Spikes Noted: Yes (low grade) Respiratory Status: Room air History of Recent Intubation: Yes (for surgery) Behavior/Cognition: Alert;Cooperative;Pleasant mood Oral Cavity Assessment: Within Functional Limits Oral Care Completed by SLP: No Oral Cavity - Dentition: Adequate natural dentition Vision: Functional for self-feeding Self-Feeding Abilities: Able to feed self Patient Positioning: Upright in bed Baseline Vocal Quality: Normal Volitional Cough: Strong Volitional Swallow: Able to elicit    Oral/Motor/Sensory Function Overall Oral Motor/Sensory Function: Within functional limits   Ice Chips Ice chips: Not tested   Thin Liquid Thin Liquid: Within functional limits Presentation: Self Fed;Straw    Nectar Thick Nectar Thick Liquid: Not tested   Honey Thick Honey Thick Liquid: Not tested   Puree Puree: Within functional limits Presentation: Self Fed;Spoon   Solid   GO   Solid: Within functional limits Presentation: [place], [place] [facility] SLP [phone]

## 2015-07-09 NOTE — Progress Notes (Signed)
[facility] PT at bedside doing evaluation. Discussing bowel habits.   Pt has now for at least 2 days straight had very small, less than 4m bowel movements 3x/ day at least. This rn has taken care of pt now going on 3 days and has seen no formed stool the entire time. Pt is a +2 roll, so rn has helped with every bowel movement clean up.   At home prior to admission pt had normal formed bowel movements.   At present bowels sounds present/ possibly slightly hypoactive. Will page md and ask if there is any concern for bowel obstruction with pts change in bowel habits.

## 2015-07-09 NOTE — Progress Notes (Signed)
Patient ID: Sarah [name], female   DOB: Mar 15, 1966, 50 y.o.   MRN: [id number] BP 117/64 mmHg  Pulse 70  Temp(Src) 99.3 F (37.4 C) (Oral)  Resp 18  Ht '5\' 10"'$  (1.778 m)  Wt 117.3 kg (258 lb 9.6 oz)  BMI 37.11 kg/m2  SpO2 99%  LMP 06/06/2015 (Approximate) Alert and oriented x 4, minimal movement right toes, none on left Sensation slowly returning Wound is clean, dry, no signs of infection.  Restarted xarelto, spoke with [facility] oncology. Will start chemo once would is healed 2-3 weeks.  Continue pt.

## 2015-07-09 NOTE — Care Management Note (Signed)
Case Management Note  Patient Details  Name: Sarah [name] MRN: [id number] Date of Birth: 03-22-1966  Subjective/Objective:                    Action/Plan: Patient was admitted for a THORACIC LAMINECTOMY FOR TUMOR.  Lives at home with sister. Will follow for discharge needs pending PT/OT evals and physician orders.  Expected Discharge Date:                  Expected Discharge Plan:  [place]  In-House Referral:     Discharge planning Services     Post Acute Care Choice:    Choice offered to:     DME Arranged:    DME Agency:     HH Arranged:    [place] Agency:     Status of Service:  In process, will continue to follow  Medicare Important Message Given:    Date Medicare IM Given:    Medicare IM give by:    Date Additional Medicare IM Given:    Additional Medicare Important Message give by:     If discussed at [place] of Stay Meetings, dates discussed:    Additional CommentsRolm Baptise, RN 07/09/2015, 12:03 PM [phone]

## 2015-07-09 NOTE — Care Management Note (Signed)
Case Management Note  Patient Details  Name: Sarah [name] MRN: [id number] Date of Birth: Mar 18, 1966  Subjective/Objective:    Pt admitted and underwent T10 laminectomy. She is from home with her sister.                 Action/Plan: Rec is for CIR. CM following for discharge needs.   Expected Discharge Date:                  Expected Discharge Plan:  [place]  In-House Referral:     Discharge planning Services     Post Acute Care Choice:    Choice offered to:     DME Arranged:    DME Agency:     HH Arranged:    [place] Agency:     Status of Service:  In process, will continue to follow  Medicare Important Message Given:    Date Medicare IM Given:    Medicare IM give by:    Date Additional Medicare IM Given:    Additional Medicare Important Message give by:     If discussed at [place] of Stay Meetings, dates discussed:    Additional Comments:  Pollie Friar, RN 07/09/2015, 10:32 AM

## 2015-07-09 NOTE — Progress Notes (Signed)
Pt to xray

## 2015-07-10 ENCOUNTER — Encounter (HOSPITAL_COMMUNITY): Payer: Self-pay | Admitting: Neurosurgery

## 2015-07-10 LAB — GLUCOSE, CAPILLARY
GLUCOSE-CAPILLARY: 191 mg/dL — AB (ref 65–99)
GLUCOSE-CAPILLARY: 131 mg/dL — AB (ref 65–99)
Glucose-Capillary: 106 mg/dL — ABNORMAL HIGH (ref 65–99)
Glucose-Capillary: 115 mg/dL — ABNORMAL HIGH (ref 65–99)
Glucose-Capillary: 134 mg/dL — ABNORMAL HIGH (ref 65–99)
Glucose-Capillary: 93 mg/dL (ref 65–99)
Glucose-Capillary: 99 mg/dL (ref 65–99)

## 2015-07-10 NOTE — Progress Notes (Signed)
Physical Therapy Treatment Patient Details Name: Sarah [name] MRN: [id number] DOB: 17-Oct-1965 Today's Date: 07/10/2015    History of Present Illness pt presents after a decline in motor function and was found to have reoccurance of T3 Tumor now s/p T3 Lami with Tumor Resection.  pt with hx of Metastatic Thyroid CA, T3 Tumor Resection 12/2013, and HTN.      PT Comments    Sarah [name] made modest progress today.  She required +2 max assist using bari stedy to achieve 60% standing w/ much effort.  She was able to voluntarily perform heel slide x3 at start of session.  Pt will benefit from continued skilled PT services to increase functional independence and safety.   Follow Up Recommendations  CIR     Equipment Recommendations  None recommended by PT    Recommendations for Other Services       Precautions / Restrictions Precautions Precautions: Back;Fall Precaution Comments: Reviewed and performed log roll technique for supine<>sit Restrictions Weight Bearing Restrictions: No    Mobility  Bed Mobility Overal bed mobility: Needs Assistance Bed Mobility: Rolling;Sidelying to Sit;Sit to Sidelying Rolling: Min assist Sidelying to sit: +2 for physical assistance;Max assist     Sit to sidelying: Max assist;+2 for physical assistance General bed mobility comments: Cues for log rolling technique.  Pt able to flex Rt knee for ease of rolling to the Lt.  Max assist to support trunk and manage Bil LEs w/ supine<>sit.  Blocked Bil knees during sidelying>sit to prevent knee extension Bil.  Transfers Overall transfer level: Needs assistance Equipment used:  (lift equipment) Transfers: Sit to/from Stand Sit to Stand: From elevated surface;Max assist;+2 physical assistance         General transfer comment: Assist to boost up to 60% standing using bed pad and pt using strength in Bil LEs.  Rt LE flexes w/ sit>stand, Lt LE WBing but little to no involuntary contraction.     Ambulation/Gait                 Stairs            Wheelchair Mobility    Modified Rankin (Stroke Patients Only)       Balance Overall balance assessment: Needs assistance Sitting-balance support: Bilateral upper extremity supported;Feet supported Sitting balance-Leahy Scale: Poor Sitting balance - Comments: Fluctuated between min guard assist>min assist.  Kyphotic and required cues for upright posture. Pt anxious about sliding OOB. Postural control: Posterior lean Standing balance support: Bilateral upper extremity supported;During functional activity Standing balance-Leahy Scale: Zero                      Cognition Arousal/Alertness: Awake/alert Behavior During Therapy: WFL for tasks assessed/performed Overall Cognitive Status: Within Functional Limits for tasks assessed                      Exercises General Exercises - Lower Extremity Heel Slides: AROM;AAROM;Right;5 reps;Supine    General Comments        Pertinent Vitals/Pain Pain Assessment: Faces Faces Pain Scale: Hurts little more Pain Location: LEs Pain Descriptors / Indicators: Discomfort Pain Intervention(s): Limited activity within patient's tolerance;Monitored during session    Home Living                      Prior Function            PT Goals (current goals can now be found in the care plan section) Acute Rehab  PT Goals Patient Stated Goal: to regain independence  PT Goal Formulation: With patient Time For Goal Achievement: 07/20/15 Potential to Achieve Goals: Good Progress towards PT goals: Progressing toward goals    Frequency  Min 3X/week    PT Plan Current plan remains appropriate    Co-evaluation             End of Session Equipment Utilized During Treatment: Gait belt Activity Tolerance: Patient limited by fatigue;Patient tolerated treatment well Patient left: with call bell/phone within reach;in bed;with bed alarm set;with  family/visitor present;Other (comment);with SCD's reapplied (w/ Rehab Admission Coordinator at bedside)     Time: 0034-9611 PT Time Calculation (min) (ACUTE ONLY): 29 min  Charges:  $Therapeutic Activity: 23-37 mins                    G Codes:      Sarah [name] PT, DPT  Pager: [phone] Phone: [phone] 07/10/2015, 3:12 PM

## 2015-07-10 NOTE — Progress Notes (Signed)
Inpatient Rehabilitation  I have received insurance authorization to admit pt. to CIR.  I  discussed with Jacqualin Combes, RNCM who is reaching out to Dr. Christella Noa to determine if pt is medically ready for CIR today. Please call if questions.

## 2015-07-10 NOTE — H&P (Signed)
Physical Medicine and Rehabilitation Admission H&P    Chief Complaint  Patient presents with  . BLE spasticity with worsening of BLE Weakness due to pathologic fracture and metastatic  cancer.     HPI:  Sarah [name] is a 50 y.o. 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 T3 tumor resected 01/09/14 and subsequent chemo/XRT. She has been undergoing therapy with ability to transfer with SB/min assist and attempts at standing--06/24/15. She has had 3 week history of increase in spasticity and increase in BLE edema. She was admitted on 07/05/15 with B/B incontinence and decline in motor function. MRI done showing chronic T3 pathologic fracture with increased bulky epidural tumor with severe cord compression, new abnormal marrow signal T12- L3 with new osseous metastatic disease and similar findings S1. She was taken to OR emergently for T3 laminectomy for subtotal tumor resection by Dr. Christella Noa and treated with multiple units PRBC for ABLA.  currently on decadron taper, Xarelto to be resumed today and has plans for chemo in 2-3 weeks per input from  Hem[facility]On[facility] at [facility].  Therapy ongoing and patient has had increase in spasticity, worsening of BLE weakenss with decline in functional mobility as well as ability to complete ADL tasks. CIR recommended for follow up therapy.      Review of Systems  Constitutional: Negative for fever and chills.  HENT: Negative for hearing loss.   Eyes: Negative for blurred vision and double vision.  Respiratory: Negative for cough, sputum production and shortness of breath.   Cardiovascular: Negative for chest pain and palpitations.  Gastrointestinal: Positive for diarrhea. Negative for heartburn, nausea and constipation.  Genitourinary: Positive for frequency. Negative for urgency.  Musculoskeletal: Negative for myalgias and back  pain.  Neurological: Positive for tingling, sensory change (decreased sensation BLE and saddle anesthesia.), focal weakness and weakness. Negative for dizziness and headaches.  Psychiatric/Behavioral: Negative for depression and memory loss. The patient is not nervous/anxious and does not have insomnia.   All other systems reviewed and are negative.     Past Medical History  Diagnosis Date  . Hypertension   . DDD (degenerative disc disease), cervical   . DJD (degenerative joint disease)   . Obesity   . Chronic back pain   . 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 History of rectal fissure   . Rotator cuff tendonitis right  . Pulmonary embolus, right ([place]) 2015  . 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)      Past Surgical History  Procedure Laterality Date  . Tonsillectomy    . Laminectomy N/A 12/14/2013    Procedure: THORACIC LAMINECTOMY FOR TUMOR THORACIC THREE;  Surgeon: Ashok Pall, MD;  Location: [place] NEURO ORS;  Service: Neurosurgery;  Laterality: N/A;  THORACIC LAMINECTOMY FOR TUMOR THORACIC THREE  . Posterior lumbar fusion 4 level N/A 12/30/2013    Procedure: Thoracic one-Thoracic five posterior thoracic fusion with pedicle screws;  Surgeon: Ashok Pall, MD;  Location: [place] NEURO ORS;  Service: Neurosurgery;  Laterality: N/A;  Thoracic one-Thoracic five posterior thoracic fusion with pedicle screws  . Thyroidectomy N/A 01/09/2014    Procedure: TOTAL THYROIDECTOMY;  Surgeon: Izora Gala, MD;  Location: [place];  Service: ENT;  Laterality: N/A;  . Laminectomy N/A 07/05/2015    Procedure: THORACIC LAMINECTOMY FOR TUMOR;  Surgeon: Ashok Pall, MD;  Location: [place] NEURO ORS;  Service: Neurosurgery;  Laterality: N/A;    Family History  Problem Relation Age of Onset  . Hypertension Mother   . Diabetes Mother   . Hypertension Father   . Stroke Father     Social History: Lives with sister. Independent for transfers at wheelchair level.  Was able to do sponge baths on BSC after set up. Sister checks in during the day. She reports that she has never smoked. She has never used smokeless tobacco. She reports that she does not drink alcohol or use illicit drugs.    Allergies  Allergen Reactions  . Keflex [Cephalexin] Nausea And Vomiting  . Tramadol Nausea And Vomiting  . Heparin Other (See Comments)    Weak positive platelets induced antibodies--2015  . Hydrocodone Nausea And Vomiting    Medications Prior to Admission  Medication Sig Dispense Refill  . baclofen (LIORESAL) 20 MG tablet Take 1 tablet (20 mg total) by mouth 3 (three) times daily. 120 each 4  . ibuprofen (ADVIL,MOTRIN) 200 MG tablet Take 400 mg by mouth every 6 (six) hours as needed for headache or moderate pain.    [name] Kitchen KLOR-CON M10 10 MEQ tablet TAKE ONE TABLET BY MOUTH ONCE DAILY 30 tablet 5  . levothyroxine (SYNTHROID, LEVOTHROID) 200 MCG tablet Take 200 mcg by mouth daily before breakfast.    . levothyroxine (SYNTHROID, LEVOTHROID) 75 MCG tablet Take by mouth.    [name] Kitchen lisinopril (PRINIVIL,ZESTRIL) 10 MG tablet Take 1 tablet (10 mg total) by mouth daily. 90 tablet 3  . ondansetron (ZOFRAN) 4 MG tablet Take 1 tablet (4 mg total) by mouth every 6 (six) hours. 30 tablet 3  . pantoprazole (PROTONIX) 40 MG tablet TAKE ONE TABLET BY MOUTH ONCE DAILY 30 tablet 5  . rivaroxaban (XARELTO) 20 MG TABS tablet Take 1 tablet (20 mg total) by mouth daily. 60 tablet 4  . sertraline (ZOLOFT) 25 MG tablet Take 1 tablet (25 mg total) by mouth daily. 30 tablet 5  . zinc oxide (BALMEX) 11.3 % CREA cream Apply 1 application topically daily as needed. Apply to rectum      Home: Home Living Family/patient expects to be discharged to:: Inpatient rehab Living Arrangements: Other relatives (sister and brother in law) Available Help at Discharge: Family, Available 24 hours/day Type of Home: House Home Access: Ramped entrance Home Layout: One level Bathroom Shower/Tub: Engineer, agricultural Accessibility: No Home Equipment: Bedside commode, Wheelchair - manual, Hospital bed Additional Comments: Pt lives with sister    Functional History: Prior Function Level of Independence: Needs assistance Gait / Transfers Assistance Needed: pt was slide board transfering and scooting to drop arm with MinG prior to decline.   ADL's / Homemaking Assistance Needed: Performed bed level baths and dressed herself.  Family performed homemaking tasks.    Functional Status:  Mobility: Bed Mobility Overal bed mobility: Needs Assistance Bed Mobility: Rolling, Sidelying to Sit, Sit to Sidelying Rolling: Min assist Sidelying to sit: +2 for physical assistance, Max assist Sit to sidelying: Max assist, +2 for physical assistance General bed mobility comments: Cues for log rolling technique.  Pt able to flex Rt knee for ease of rolling to the Lt.  Max assist to support trunk and manage Bil LEs w/ supine<>sit.  Blocked Bil knees during sidelying>sit to prevent knee extension Bil. Transfers Overall transfer level: Needs assistance Equipment used:  (lift equipment) Transfer via Lift Equipment:  (Bariatric stedy) Transfers: Sit to/from Stand Sit to Stand: From elevated surface, Max assist, +2 physical assistance General transfer  comment: Assist to boost up to 60% standing using bed pad and pt using strength in Bil LEs.  Rt LE flexes w/ sit>stand, Lt LE WBing but little to no involuntary contraction.        ADL: ADL Overall ADL's : Needs assistance/impaired Eating/Feeding: Independent Grooming: Wash/dry hands, Wash/dry face, Oral care, Applying deodorant, Brushing hair, Set up, Bed level Upper Body Bathing: Set up Lower Body Bathing: Maximal assistance, Bed level Upper Body Dressing : Bed level, Moderate assistance Lower Body Dressing: Total assistance, Bed level Toilet Transfer: Total assistance Toilet Transfer Details (indicate cue type and reason): unable to safely attempt   Toileting- Clothing Manipulation and Hygiene: Total assistance, Bed level Functional mobility during ADLs: Maximal assistance, +2 for physical assistance (bed mobility ) General ADL Comments: Session focused on bed mobility for hygiene after toileting. Radiology came to take pt for scan during session. Pt able to roll to side with max A to assist with BLE. Max A to clean pt. Pt states PTA, she completed bathing/dressing on BSC and was continenet of bowel/bladder. Pt states she feels she has more spasticity in BLE. Unable to actively dorsiflex B feet. Educated pt/family on importance of proper positioning and preventing foot drop. Will need PRAFO boots at this time.  Cognition: Cognition Overall Cognitive Status: Within Functional Limits for tasks assessed Orientation Level: Oriented X4 Cognition Arousal/Alertness: Awake/alert Behavior During Therapy: WFL for tasks assessed/performed Overall Cognitive Status: Within Functional Limits for tasks assessed   Blood pressure 118/84, pulse 74, temperature 98.1 F (36.7 C), temperature source Oral, resp. rate 20, height '5\' 10"'$  (1.778 m), weight 117.3 kg (258 lb 9.6 oz), last menstrual period 06/06/2015, SpO2 99 %. Physical Exam  Nursing note and vitals reviewed. Constitutional: She is oriented to person, place, and time. She appears well-developed and well-nourished.  Obese female.   HENT:  Head: Normocephalic and atraumatic.  Mouth/Throat: Oropharynx is clear and moist.  Eyes: Conjunctivae and EOM are normal. Pupils are equal, round, and reactive to light.  Neck: Normal range of motion. Neck supple.  Cardiovascular: Normal rate and regular rhythm.   No murmur heard. Respiratory: Effort normal. No stridor. She has decreased breath sounds in the right lower field and the left lower field. She has no wheezes. She has no rales.  GI: Soft. Bowel sounds are normal. She exhibits no distension. There is no tenderness. There is no guarding.   Genitourinary:  Rectal spincter lax due to lack of rectal tone. Rectal tags noted. Soft stool felt just beyond anal verge. Foley in place.   Musculoskeletal: She exhibits no edema or tenderness.  Neurological: She is alert and oriented to person, place, and time. She displays abnormal reflex. She exhibits abnormal muscle tone. Coordination abnormal.  Flat but speech clear and able to follow commands without difficulty.   BUE 5/5 proximal to distal.  RLE: hip flexion, knee extension 2+/5, ankle dorsi/plantar flexion 3+/5 LLE: 2-/5 proximal to distal  Extensor tone.  Tight heel cords bilaterally.  DTR 3+ b/l LE  Skin: Skin is warm and dry.  Upper thoracic incision with dry scabs under honeycomb dressing.   Psychiatric: Her speech is normal and behavior is normal. Judgment and thought content normal. Her affect is blunt. Cognition and memory are normal.    Results for orders placed or performed during the hospital encounter of 07/05/15 (from the past 48 hour(s))  Glucose, capillary     Status: Abnormal   Collection Time: 07/09/15  4:35 PM  Result Value Ref  Range   Glucose-Capillary 121 (H) 65 - 99 mg/dL  Glucose, capillary     Status: Abnormal   Collection Time: 07/09/15  8:54 PM  Result Value Ref Range   Glucose-Capillary 174 (H) 65 - 99 mg/dL   Comment 1 Notify RN    Comment 2 Document in Chart   Glucose, capillary     Status: Abnormal   Collection Time: 07/10/15 12:05 AM  Result Value Ref Range   Glucose-Capillary 134 (H) 65 - 99 mg/dL   Comment 1 Notify RN    Comment 2 Document in Chart   Glucose, capillary     Status: Abnormal   Collection Time: 07/10/15  5:14 AM  Result Value Ref Range   Glucose-Capillary 115 (H) 65 - 99 mg/dL   Comment 1 Notify RN    Comment 2 Document in Chart   Glucose, capillary     Status: None   Collection Time: 07/10/15  8:03 AM  Result Value Ref Range   Glucose-Capillary 99 65 - 99 mg/dL  Glucose, capillary     Status: None   Collection Time:  07/10/15 11:36 AM  Result Value Ref Range   Glucose-Capillary 93 65 - 99 mg/dL  Glucose, capillary     Status: Abnormal   Collection Time: 07/10/15  4:47 PM  Result Value Ref Range   Glucose-Capillary 191 (H) 65 - 99 mg/dL  Glucose, capillary     Status: Abnormal   Collection Time: 07/10/15  8:06 PM  Result Value Ref Range   Glucose-Capillary 131 (H) 65 - 99 mg/dL   Comment 1 Notify RN    Comment 2 Document in Chart   Glucose, capillary     Status: Abnormal   Collection Time: 07/10/15 11:57 PM  Result Value Ref Range   Glucose-Capillary 106 (H) 65 - 99 mg/dL   Comment 1 Notify RN    Comment 2 Document in Chart   Glucose, capillary     Status: Abnormal   Collection Time: 07/11/15  3:50 AM  Result Value Ref Range   Glucose-Capillary 114 (H) 65 - 99 mg/dL   Comment 1 Notify RN    Comment 2 Document in Chart   Glucose, capillary     Status: Abnormal   Collection Time: 07/11/15  7:37 AM  Result Value Ref Range   Glucose-Capillary 113 (H) 65 - 99 mg/dL  Glucose, capillary     Status: None   Collection Time: 07/11/15 11:12 AM  Result Value Ref Range   Glucose-Capillary 99 65 - 99 mg/dL    Dg Abd 1 View  07/09/2015  CLINICAL DATA:  Loose stools EXAM: ABDOMEN - 1 VIEW COMPARISON:  None. FINDINGS: Scattered large and small bowel gas is noted. Fecal material is noted throughout the colon. No obstructive changes are seen. No abnormal mass or abnormal calcifications are noted. Degenerative changes of lumbar spine and hip joints are seen. IMPRESSION: No acute abnormality noted. Electronically Signed   By: Inez Catalina M.D.   On: 07/09/2015 12:41    Medical Problem List and Plan: 1. 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 H/o PE/Anticoagulation: Pharmaceutical: Xarelto 3. Pain Management:  Controlled currently on oxycodone prn.  4. Mood:  LCSW to follow for evaluation and support.  Ego support provided. She is aware of hard work ahead of her but has good social supports at home.  5. Neuropsych: This patient is  capable of making decisions  on her own behalf. 6. Skin/Wound Care: Routine pressure relief measures. Continue to monitor thoracic incision for healing as well as skin due to bowel incontinence.  7. Fluids/Electrolytes/Nutrition: Monitor I/O. Appetite has been good. Check lytes in am.  8.Metastatic cancer with T3 pathologic FX with cord compression and new osseous mets T12- L3/S1:  9. Steroid induced Hyperglycemia:  Blood sugars improving with decadron taper. Will monitor BID for now.  10. HTN: Continue to monitor BP bid--off prinivil at this time as blood pressures controlled. May need to be resumed as activity level improves  11. Spasticity: Continue baclofen 20 mg 5 X day with valium prn.  12.  ABLA: Will monitor for now. Check CBC in am.  13. Leucocytosis: Likely steroid induced. Will check UA/UCS with  14. Hypothyroid s/p thyroidectomy for hurtle cell thyroid cancer:  31. H/o depression: Resume home dose Zoloft. 15. Neurogenic bowel with Obstipation:  Start bowel program--d/c colace.  Start Senna 2 bid and use SSE today.  Will need daily suppository with dig stim in am.  16. Neurogenic bladder: Was able to void at home. Remove foley in am and start bladder training. Check UA/UCS.    Post Admission Physician Evaluation: 1. 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 [name] admitted to receive collaborative, interdisciplinary care between the physiatrist, rehab nursing staff, and therapy team. 3. Patient's level of medical complexity and substantial therapy needs in context of that medical necessity cannot be provided at a lesser intensity of care such as a SNF. 4. Patient has experienced substantial functional loss from his/her baseline which was documented above under the "Functional  History" and "Functional Status" headings.  Judging by the patient's diagnosis, physical exam, and functional history, the patient has potential for functional progress which will result in measurable gains while on inpatient rehab.  These gains will be of substantial and practical use upon discharge  in facilitating mobility and self-care at the household level. 5. Physiatrist will provide 24 hour management of medical needs as well as oversight of the therapy plan/treatment and provide guidance as appropriate regarding the interaction of the two. 6. 24 hour rehab nursing will assist with bladder management, bowel management, safety, skin/wound care, disease management, medication administration and pain management and help integrate therapy concepts, techniques,education, etc. 7. PT will assess and treat for/with: Lower extremity strength, range of motion, stamina, balance, functional mobility, safety, adaptive techniques and equipment, woundcare, coping skills, pain control, education.   Goals are: Mod I at wheelchair level. 8. OT will assess and treat for/with: ADL's, functional mobility, safety, upper extremity strength, adaptive techniques and equipment, wound mgt, ego support, and community reintegration.   Goals are: Mod I at wheelchair level. Therapy may not proceed with showering this patient. 9. SLP will assess and treat for/with: swallowing.  Goals are: Ind. 10. Case Management and Social Worker will assess and treat for psychological issues and discharge planning. 11. Team conference will be held weekly to assess progress toward goals and to determine barriers to discharge. 12. Patient will receive at least 3 hours of therapy per day at least 5 days per week. 13. ELOS: 12-17 days.       14. Prognosis:  good and fair  Delice Lesch, MD 07/11/2015

## 2015-07-10 NOTE — PMR Pre-admission (Signed)
PMR Admission Coordinator Pre-Admission Assessment  Patient: Sarah [name] is an 50 y.o., female MRN: [id number] DOB: Aug 19, 1965 Height: '5\' 10"'$  (177.8 cm) Weight: 117.3 kg (258 lb 9.6 oz)              Insurance Information HMO:     PPO:  yes     PCP:      IPA:      80/20:      OTHER:  PRIMARY: BCBS      Policy#:  [id number]      Subscriber:  self CM Name:  Delight Stare      Phone#:  [phone]     Fax#: [phone] Pre-Cert#:  [id number], authorized for 4/19-07/23/15 with clinical updates due 07/23/15      Employer:  Benefits:  Phone #:  [phone]     Name: [employer]. Date:  03/25/15     Deduct:  $500      Out of Pocket Max:  $800      Life Max:  n/a CIR:  70%/30%      SNF:  70%/30% Outpatient:  $30 copay; 30 visits max, 19 remaining      Home Health:   70%      Co-Pay:  30% DME:  70%     Co-Pay:  30% Providers:  In network SECONDARY:       Policy#:       Subscriber:  CM Name:       Phone#:      Fax#:  Pre-Cert#:       Employer:  Benefits:  Phone #:      Name:  Eff. 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 T3 tumor resected 01/09/14 and subsequent chemo/XRT. She has been undergoing therapy with ability to transfer with SB/min assist and attempts at  standing--06/24/15. She has had 3 week history of increase in spasticity and increase in BLE edema. She was admitted on 07/05/15 with B/B incontinence and decline in motor function. MRI done showing chronic T3 pathologic fracture with increased bulky epidural tumor with severe cord compression, new abnormal marrow signal T12- L3 with new osseous metastatic disease and similar findings S1. She was taken to OR emergently for T3 laminectomy for subtotal tumor resection by Dr. Christella Noa and treated with multiple units PRBC for ABLA.  currently on decadron taper, Xarelto to be resumed today and has plans for chemo in 2-3 weeks per input from Hem/Onc at [facility]. Therapy ongoing and patient has had increase in spasticity, worsening of BLE weakenss with decline  in functional mobility as well as ability to complete ADL tasks. CIR recommended for follow up therapy.       Past Medical History  Past Medical History  Diagnosis Date  . Hypertension   . DDD (degenerative disc disease), cervical   . DJD (degenerative joint disease)   . Obesity   . Chronic back pain   . Cancer [facility])     FNA of thyroid positive for onconytic/hurthle cell carcinoma  . History of rectal fissure   . Rotator cuff tendonitis right    Family History  family history includes Diabetes in her mother; Hypertension in her father and mother; Stroke in her father.  Prior Rehab/Hospitalizations:  Has the patient had major surgery during 100 days prior to admission? No  Current Medications   Current facility-administered medications:  .  0.9 %  sodium chloride infusion, 250 mL, Intravenous, Continuous, Ashok Pall, MD, 250 mL at 07/06/15 0255 .  0.9 %  sodium chloride infusion, , Intravenous, Continuous, Chesley Mires, MD, Last Rate: 100 mL/hr at 07/06/15 0255 .  acetaminophen (TYLENOL) tablet 650 mg, 650 mg, Oral, Q4H PRN **OR** acetaminophen (TYLENOL) suppository 650 mg, 650 mg, Rectal, Q4H PRN, Ashok Pall, MD .  baclofen  (LIORESAL) tablet 20 mg, 20 mg, Oral, 5 X Daily, Earnie Larsson, MD, 20 mg at 07/11/15 1047 .  bisacodyl (DULCOLAX) EC tablet 5 mg, 5 mg, Oral, Daily PRN, Ashok Pall, MD, 5 mg at 07/10/15 1659 .  dexamethasone (DECADRON) injection 4 mg, 4 mg, Intravenous, Q12H **OR** dexamethasone (DECADRON) tablet 4 mg, 4 mg, Oral, Q12H, Ashok Pall, MD, 4 mg at 07/11/15 1046 .  diazepam (VALIUM) tablet 5 mg, 5 mg, Oral, Q6H PRN, Rahul P Desai, PA-C, 5 mg at 07/09/15 2105 .  docusate sodium (COLACE) capsule 100 mg, 100 mg, Oral, BID, Ashok Pall, MD, 100 mg at 07/11/15 1047 .  insulin aspart (novoLOG) injection 0-9 Units, 0-9 Units, Subcutaneous, 6 times per day, Javier Glazier, MD, 1 Units at 07/10/15 2010 .  levothyroxine (SYNTHROID, LEVOTHROID) tablet 200 mcg, 200 mcg, Oral, QAC breakfast, Earnie Larsson, MD, 200 mcg at 07/11/15 0757 .  magnesium citrate solution 1 Bottle, 1 Bottle, Oral, Once PRN, Ashok Pall, MD .  menthol-cetylpyridinium (CEPACOL) lozenge 3 mg, 1 lozenge, Oral, PRN **OR** phenol (CHLORASEPTIC) mouth spray 1 spray, 1 spray, Mouth/Throat, PRN, Ashok Pall, MD .  morphine 2 MG/ML injection 1-4 mg, 1-4 mg, Intravenous, Q3H PRN, Rahul P Desai, PA-C, 2 mg at 07/07/15 1023 .  ondansetron (ZOFRAN) injection 4 mg, 4 mg, Intravenous, Q4H PRN, Ashok Pall, MD, 4 mg at 07/09/15 0643 .  oxyCODONE-acetaminophen (PERCOCET/ROXICET) 5-325 MG per tablet 1-2 tablet, 1-2 tablet, Oral, Q4H PRN, Rahul P Desai, PA-C, 1 tablet at 07/11/15 0411 .  pantoprazole (PROTONIX) EC tablet 40 mg, 40 mg, Oral, Daily, Ashok Pall, MD, 40 mg at 07/11/15 1047 .  rivaroxaban (XARELTO) tablet 20 mg, 20 mg, Oral, Q supper, Ashok Pall, MD, 20 mg at 07/10/15 1659 .  senna-docusate (Senokot-S) tablet 1 tablet, 1 tablet, Oral, QHS PRN, Ashok Pall, MD .  sodium chloride flush (NS) 0.9 % injection 3 mL, 3 mL, Intravenous, Q12H, Ashok Pall, MD, 3 mL at 07/10/15 2157 .  sodium chloride flush (NS) 0.9 % injection 3 mL, 3 mL,  Intravenous, PRN, Ashok Pall, MD .  zinc oxide (BALMEX) 16.1 % cream 1 application, 1 application, Topical, Daily PRN, Ashok Pall, MD  Patients Current Diet: DIET DYS 3 Room service appropriate?: Yes; Fluid consistency:: Thin  Precautions / Restrictions Precautions Precautions: Back, Fall Precaution Booklet Issued: No Precaution Comments: Reviewed and performed log roll technique for supine<>sit Restrictions Weight Bearing Restrictions: No   Has the patient had 2 or more falls or a fall with injury in the past year?No  Prior Activity Level Limited Community (1-2x/wk): Pt. reports she goes out of her home twice per week for trips to [place] and other errands.  Pt. says she has an older modified van for her outings in the wheelchair.  She does not drive .  Family is able to drive her Lucianne Lei.    Home Assistive Devices / Equipment Home Equipment: Bedside commode, Wheelchair - manual, Hospital bed  Prior Device Use: Indicate devices/aids used by the patient prior to current illness, exacerbation or injury? Manual wheelchair  Prior Functional Level Prior Function Level of Independence: Needs assistance Gait / Transfers Assistance Needed: pt was slide board transfering and scooting to drop arm with MinG prior to decline.   ADL's / Homemaking Assistance Needed: Performed bed level baths and dressed herself.  Family performed homemaking tasks.    Self Care: Did the patient need help bathing, dressing, using the toilet or eating?  Independent  Indoor Mobility: Did the patient need assistance with walking from room to room (with or without device)? Mobilizes with wheelchair only  Stairs: Did the patient need assistance with internal or external stairs (with or without device)? N/a, ramped entrance  Functional Cognition: Did the patient need help planning regular tasks such as shopping or remembering to take medications? Independent  Current Functional Level Cognition  Overall Cognitive  Status: Within Functional Limits for tasks assessed Orientation Level: Oriented X4    Extremity Assessment (includes Sensation/Coordination)  Upper Extremity Assessment: Overall WFL for tasks assessed  Lower Extremity Assessment: Defer to PT evaluation RLE Deficits / Details: Chronic increased spasticity, impaired soft touch, deep pressure, and pain sensation.  Strength grossly 2-/5 RLE Sensation: decreased light touch, decreased proprioception RLE Coordination: decreased fine motor, decreased gross motor LLE Deficits / Details: Chronic increased spasticity, impaired soft touch, deep pressure, and pain sensation.  Strength grossly 2-/5. LLE Sensation: decreased light touch, decreased proprioception LLE Coordination: decreased fine motor, decreased gross motor    ADLs  Overall ADL's : Needs assistance/impaired Eating/Feeding: Independent Grooming: Wash/dry hands, Wash/dry face, Oral care, Applying deodorant, Brushing hair, Set up, Bed level Upper Body Bathing: Set up Lower Body Bathing: Maximal assistance, Bed level Upper Body Dressing : Bed level, Moderate assistance Lower Body Dressing: Total assistance, Bed level Toilet Transfer: Total assistance Toilet Transfer Details (indicate cue type and reason): unable to safely attempt  Toileting- Clothing Manipulation and Hygiene: Total assistance, Bed level Functional mobility during ADLs: Maximal assistance, +2 for physical assistance (bed mobility ) [name] ADL Comments: Session focused on bed mobility for hygiene after toileting. Radiology came to take pt for scan during session. Pt able to roll to side with max A to assist with BLE. Max A to clean pt. Pt states PTA, she completed bathing/dressing on BSC and was continenet of bowel/bladder. Pt states she feels she has more spasticity in BLE. Unable to actively dorsiflex B feet. Educated pt/family on importance of proper positioning and preventing foot drop. Will need PRAFO boots at this time.     Mobility  Overal bed mobility: Needs Assistance Bed Mobility: Rolling, Sidelying to Sit, Sit to Sidelying Rolling: Min assist Sidelying to sit: +2 for physical assistance, Max assist Sit to sidelying: Max assist, +2 for physical assistance [name] bed mobility  comments: Cues for log rolling technique.  Pt able to flex Rt knee for ease of rolling to the Lt.  Max assist to support trunk and manage Bil LEs w/ supine<>sit.  Blocked Bil knees during sidelying>sit to prevent knee extension Bil.    Transfers  Overall transfer level: Needs assistance Equipment used:  (lift equipment) Transfer via Lift Equipment:  (Bariatric stedy) Transfers: Sit to/from Stand Sit to Stand: From elevated surface, Max assist, +2 physical assistance [name] transfer comment: Assist to boost up to 60% standing using bed pad and pt using strength in Bil LEs.  Rt LE flexes w/ sit>stand, Lt LE WBing but little to no involuntary contraction.      Ambulation / Gait / Stairs / Office manager / Balance Dynamic Sitting Balance Sitting balance - Comments: Fluctuated between min guard assist>min assist.  Kyphotic and required cues for upright posture. Pt anxious about sliding OOB. Balance Overall balance assessment: Needs assistance Sitting-balance support: Bilateral upper extremity supported, Feet supported Sitting balance-Leahy Scale: Poor Sitting balance - Comments: Fluctuated between min guard assist>min assist.  Kyphotic and required cues for upright posture. Pt anxious about sliding OOB. Postural control: Posterior lean Standing balance support: Bilateral upper extremity supported, During functional activity Standing balance-Leahy Scale: Zero    Special needs/care consideration BiPAP/CPAP   no CPM   no Continuous Drip IV   no Dialysis   no        Days   no Life Vest   no Oxygen   no Special Bed   no Trach Size  no Wound Vac (area)  no       Skin thoracic back incision with honeycomb  dressing per nursing assessment                         Bowel mgmt:last BM 07/10/15, incontinent Bladder mgmt: foley catheter Diabetic mgmt   no     Previous Home Environment Living Arrangements: Other relatives (sister and brother in [employer]) Available Help at Discharge: Family, Available 24 hours/day Type of Home: House Home Layout: One level Home Access: Ramped entrance Bathroom Shower/Tub: Tub/shower unit Bathroom Accessibility: No Additional Comments: Pt lives with sister   Discharge Living Setting Plans for Discharge Living Setting: Patient's home Type of Home at Discharge: House Discharge Home Layout: One level Discharge Home Access: Ramped entrance Discharge Bathroom Shower/Tub: Tub/shower unit Discharge Bathroom Accessibility: No Does the patient have any problems obtaining your medications?: No  Social/Family/Support Systems Patient Roles: Other (Comment) (sister, daughter) Anticipated Caregiver: Magdalene River, sister and Shar Paez, mother Anticipated Caregiver's Contact Information: Magdalene River [phone]; Lariyah Shetterly [phone] Ability/Limitations of Caregiver: [name] works part time Caregiver Availability: 24/7   Goals/Additional Needs Patient/Family Goal for Rehab: min assist for PT and OT at wheelchair level; mod I SLP Expected length of stay: 18-24 days Cultural Considerations: n/a Dietary Needs: dysphagia 3 diet with thin liquids, ground meats, carb modified Equipment Needs: TBA Pt/Family Agrees to Admission and willing to participate: Yes Program Orientation Provided & Reviewed with Pt/Caregiver Including Roles  & Responsibilities: Yes   Decrease burden of Care through IP rehab admission: n/a   Possible need for SNF placement upon discharge:not anticipated   Patient Condition: This patient's medical and functional status has changed since the consult dated: 07/06/15  in which the Rehabilitation Physician determined and documented that the  patient's condition is appropriate for intensive rehabilitative care in an inpatient rehabilitation facility. See "History  of Present Illness" (above) for medical update. Functional changes are:  Pt. At +2 max assist for bed mobility, dysphagia 3 diet at 90 degrees upright. Patient's medical and functional status update has been discussed with the Rehabilitation physician and patient remains appropriate for inpatient rehabilitation. Will admit to inpatient rehab today.  Preadmission Screen Completed By:  Gerlean Ren, 07/11/2015 11:32 AM ______________________________________________________________________   Discussed status with Dr.  Posey Pronto on 07/11/15 at  1131  and received telephone approval for admission today.  Admission Coordinator:  Gerlean Ren, time 1131/Date 07/11/15

## 2015-07-10 NOTE — Progress Notes (Signed)
Speech Language Pathology Treatment: Dysphagia  Patient Details Name: Sarah [name] MRN: [id number] DOB: April 15, 1965 Today's Date: 07/10/2015 Time: 3557-3220 SLP Time Calculation (min) (ACUTE ONLY): 11 min  Assessment / Plan / Recommendation Clinical Impression  Pt reports no further incidents of "choking" on food since yesterday am.  SLP observed her eating portion of breakfast - clear voice throughout and adequate airway protection apparent.    Reviewed swallowing exercises with pt to decrease tissue fibrosis from XRT.  Massage to loosen tissue followed by exercises recommended.  Effortful swallow, Lingual press reviewed to maximize hyolaryngeal elevation musculature.  Jaw strengthening exercise provided to help maximize mandibular ROM - advised pt to measure opening by using popsicle sticks with goal to continue maximal opening.    Pt verbalized understanding to exercises and stated she conducted lingual press yesterday "almost 250 times".    Pt with good tolerance of po at this time and agreeable to continue modified diet.  Would benefit from brief follow up on CIR to assure proper execution of swallowing exercises.  Advised exercises be completed every other day.    HPI HPI: 50 yo female adm to [facility] for surgery on thoracic spinal tumor.  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  Concern for possible aspiration of cantelope noted today during breakfast.  Pt was made NPO and swallow evaluation ordered.  Pt reports some mild chronic dysphagia resulting in sensation of food lodging in throat since thyroid surgery.  She denies recent pna but admits to weight loss- which she attributes to illnesses.        SLP Plan  All goals met     Recommendations  Diet recommendations: Dysphagia 3 (mechanical soft);Thin liquid (pt agreeable to continue soft/thin diet  while in hospital) Liquids provided via: Cup;Straw Medication Administration: Whole meds with liquid (one at a time) Supervision: Patient able to self feed Compensations: Slow rate;Small sips/bites Postural Changes and/or Swallow Maneuvers: Seated upright 90 degrees;Upright 30-60 min after meal             Oral Care Recommendations: Oral care BID Follow up Recommendations: Inpatient Rehab (briefly for pharyngeal swallowing exercises to assure appropriately being conducted ) Plan: All goals met     Vera Cruz, Simla [facility] [place] [phone]

## 2015-07-10 NOTE — Progress Notes (Signed)
Patient ID: ANAELLE DUNTON, female   DOB: [birth year], 50 y.o.   MRN: [id number] BP 132/68 mmHg  Pulse 65  Temp(Src) 98.2 F (36.8 C) (Oral)  Resp 18  Ht '5\' 10"'$  (1.778 m)  Wt 117.3 kg (258 lb 9.6 oz)  BMI 37.11 kg/m2  SpO2 100%  LMP 06/06/2015 (Approximate) Alert and oriented x 4 Weak in the lower extremities, no change Will transfer tomorrow.

## 2015-07-10 NOTE — Discharge Instructions (Signed)
You may shower. Keep the wound clean.  Call if your temperature is greater than 101.5 Call if you have drainage from the wound.

## 2015-07-10 NOTE — Discharge Summary (Signed)
Physician Discharge Summary  Patient ID: Sarah [name] MRN: [id number] DOB/AGE: 49-21-1967 49 y.o.  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 [name] is well known to me having had surgery for a T3 metastatic tumor in October of 2015. She presented at that time with weakness in the lower extremities and a large neck mass. Hurtle cell cancer was identified then and she was treated with radiation and iodine for the thyroid. She underwent decompression and stabilization of the T3 met. She also underwent a thyroidectomy during that hospitalization.  Sarah [name] just prior to admission complained of losing some ground in her rehabilitation, reporting that her legs were weaker than they had been. I ordered an MRI of the Thoracic spine to check. She however became acutely weak the day of admission and underwent an MRI on that day. This showed a recurrent tumor causing spinal cord compression and obviously the source of her weakness. She was taken emergently to the operating room and underwent a redo thoracic laminectomy of T3, and partial tumor resection. We were able to decompress the cord. I have contacted [facility], and they will start a new medication in 2-3 weeks. Sarah [name] has been accepted by the Rehabilitation service for more intense Pt, and Ot. At discharge her wound is clean and dry. She has some movement in the right foot, but remains quite weak ~1/5 in the lower extremities.   Treatments: surgery: as above  Discharge Exam: Blood pressure 132/68, pulse 65,  temperature 98.2 F (36.8 C), temperature source Oral, resp. rate 18, height 5' 10"  (1.778 m), weight 117.3 kg (258 lb 9.6 oz), last menstrual period 06/06/2015, SpO2 100 %. General appearance: alert, cooperative, appears stated age and mild distress  Disposition: 01-Home or Self Care metastatic thyroid cancer    Medication List    TAKE these medications        baclofen 20 MG tablet  Commonly known as:  LIORESAL  Take 1 tablet (20 mg total) by mouth 3 (three) times daily.     ibuprofen 200 MG tablet  Commonly known as:  ADVIL,MOTRIN  Take 400 mg by mouth every 6 (six) hours as needed for headache or moderate pain.     KLOR-CON M10 10 MEQ tablet  Generic drug:  potassium chloride  TAKE ONE TABLET BY MOUTH ONCE DAILY     levothyroxine 200 MCG tablet  Commonly known as:  SYNTHROID, LEVOTHROID  Take 200 mcg by mouth daily before breakfast.     levothyroxine 75 MCG tablet  Commonly known as:  SYNTHROID, LEVOTHROID  Take by mouth.     lisinopril 10 MG tablet  Commonly known as:  PRINIVIL,ZESTRIL  Take 1 tablet (10 mg total) by mouth daily.     ondansetron 4 MG tablet  Commonly known as:  ZOFRAN  Take 1 tablet (4 mg total) by mouth every 6 (six) hours.     pantoprazole 40 MG tablet  Commonly known as:  PROTONIX  TAKE ONE TABLET BY MOUTH ONCE DAILY     rivaroxaban 20 MG Tabs tablet  Commonly known as:  XARELTO  Take  1 tablet (20 mg total) by mouth daily.     sertraline 25 MG tablet  Commonly known as:  ZOLOFT  Take 1 tablet (25 mg total) by mouth daily.     zinc oxide 11.3 % Crea cream  Commonly known as:  BALMEX  Apply 1 application topically daily as needed. Apply to rectum         Signed: Ginamarie Banfield L 07/10/2015, 10:12 PM

## 2015-07-11 ENCOUNTER — Encounter (HOSPITAL_COMMUNITY): Payer: Self-pay | Admitting: Physical Medicine and Rehabilitation

## 2015-07-11 ENCOUNTER — Ambulatory Visit (HOSPITAL_COMMUNITY): Payer: BLUE CROSS/BLUE SHIELD

## 2015-07-11 ENCOUNTER — Inpatient Hospital Stay (HOSPITAL_COMMUNITY)
Admission: RE | Admit: 2015-07-11 | Discharge: 2015-08-01 | DRG: 543 | Disposition: A | Discharge status: Home Health | Payer: BLUE CROSS/BLUE SHIELD | Source: Intra-hospital | Attending: Physical Medicine & Rehabilitation | Admitting: Physical Medicine & Rehabilitation

## 2015-07-11 DIAGNOSIS — Z6834 Body mass index (BMI) 34.0-34.9, adult: Secondary | ICD-10-CM | POA: Diagnosis not present

## 2015-07-11 DIAGNOSIS — R739 Hyperglycemia, unspecified: Secondary | ICD-10-CM | POA: Diagnosis present

## 2015-07-11 DIAGNOSIS — G8220 Paraplegia, unspecified: Secondary | ICD-10-CM | POA: Diagnosis present

## 2015-07-11 DIAGNOSIS — B9620 Unspecified Escherichia coli [E. coli] as the cause of diseases classified elsewhere: Secondary | ICD-10-CM | POA: Diagnosis present

## 2015-07-11 DIAGNOSIS — C73 Malignant neoplasm of thyroid gland: Secondary | ICD-10-CM | POA: Diagnosis present

## 2015-07-11 DIAGNOSIS — N390 Urinary tract infection, site not specified: Secondary | ICD-10-CM | POA: Diagnosis present

## 2015-07-11 DIAGNOSIS — R159 Full incontinence of feces: Secondary | ICD-10-CM | POA: Diagnosis present

## 2015-07-11 DIAGNOSIS — I10 Essential (primary) hypertension: Secondary | ICD-10-CM | POA: Diagnosis present

## 2015-07-11 DIAGNOSIS — K592 Neurogenic bowel, not elsewhere classified: Secondary | ICD-10-CM | POA: Diagnosis present

## 2015-07-11 DIAGNOSIS — M4714 Other spondylosis with myelopathy, thoracic region: Secondary | ICD-10-CM | POA: Insufficient documentation

## 2015-07-11 DIAGNOSIS — D62 Acute posthemorrhagic anemia: Secondary | ICD-10-CM | POA: Diagnosis present

## 2015-07-11 DIAGNOSIS — R1314 Dysphagia, pharyngoesophageal phase: Secondary | ICD-10-CM

## 2015-07-11 DIAGNOSIS — F4321 Adjustment disorder with depressed mood: Secondary | ICD-10-CM | POA: Diagnosis present

## 2015-07-11 DIAGNOSIS — N319 Neuromuscular dysfunction of bladder, unspecified: Secondary | ICD-10-CM | POA: Diagnosis present

## 2015-07-11 DIAGNOSIS — G8389 Other specified paralytic syndromes: Secondary | ICD-10-CM

## 2015-07-11 DIAGNOSIS — C7951 Secondary malignant neoplasm of bone: Principal | ICD-10-CM | POA: Diagnosis present

## 2015-07-11 DIAGNOSIS — C799 Secondary malignant neoplasm of unspecified site: Secondary | ICD-10-CM | POA: Insufficient documentation

## 2015-07-11 DIAGNOSIS — T380X5A Adverse effect of glucocorticoids and synthetic analogues, initial encounter: Secondary | ICD-10-CM | POA: Diagnosis present

## 2015-07-11 DIAGNOSIS — R2689 Other abnormalities of gait and mobility: Secondary | ICD-10-CM | POA: Diagnosis present

## 2015-07-11 DIAGNOSIS — E890 Postprocedural hypothyroidism: Secondary | ICD-10-CM | POA: Diagnosis present

## 2015-07-11 DIAGNOSIS — F329 Major depressive disorder, single episode, unspecified: Secondary | ICD-10-CM | POA: Diagnosis present

## 2015-07-11 DIAGNOSIS — E669 Obesity, unspecified: Secondary | ICD-10-CM | POA: Diagnosis present

## 2015-07-11 DIAGNOSIS — Z86711 Personal history of pulmonary embolism: Secondary | ICD-10-CM

## 2015-07-11 DIAGNOSIS — IMO0002 Reserved for concepts with insufficient information to code with codable children: Secondary | ICD-10-CM | POA: Diagnosis present

## 2015-07-11 DIAGNOSIS — M8458XD Pathological fracture in neoplastic disease, other specified site, subsequent encounter for fracture with routine healing: Secondary | ICD-10-CM | POA: Diagnosis present

## 2015-07-11 DIAGNOSIS — R252 Cramp and spasm: Secondary | ICD-10-CM | POA: Diagnosis present

## 2015-07-11 DIAGNOSIS — C801 Malignant (primary) neoplasm, unspecified: Secondary | ICD-10-CM | POA: Diagnosis not present

## 2015-07-11 DIAGNOSIS — D492 Neoplasm of unspecified behavior of bone, soft tissue, and skin: Secondary | ICD-10-CM | POA: Diagnosis present

## 2015-07-11 DIAGNOSIS — D72829 Elevated white blood cell count, unspecified: Secondary | ICD-10-CM

## 2015-07-11 DIAGNOSIS — M6249 Contracture of muscle, multiple sites: Secondary | ICD-10-CM | POA: Diagnosis not present

## 2015-07-11 DIAGNOSIS — G8918 Other acute postprocedural pain: Secondary | ICD-10-CM | POA: Diagnosis present

## 2015-07-11 DIAGNOSIS — R261 Paralytic gait: Secondary | ICD-10-CM | POA: Diagnosis present

## 2015-07-11 DIAGNOSIS — R29898 Other symptoms and signs involving the musculoskeletal system: Secondary | ICD-10-CM | POA: Diagnosis not present

## 2015-07-11 DIAGNOSIS — F32A Depression, unspecified: Secondary | ICD-10-CM | POA: Diagnosis present

## 2015-07-11 DIAGNOSIS — M6281 Muscle weakness (generalized): Secondary | ICD-10-CM | POA: Diagnosis present

## 2015-07-11 DIAGNOSIS — K5900 Constipation, unspecified: Secondary | ICD-10-CM | POA: Diagnosis present

## 2015-07-11 LAB — GLUCOSE, CAPILLARY
GLUCOSE-CAPILLARY: 114 mg/dL — AB (ref 65–99)
Glucose-Capillary: 113 mg/dL — ABNORMAL HIGH (ref 65–99)
Glucose-Capillary: 99 mg/dL (ref 65–99)

## 2015-07-11 LAB — URINE MICROSCOPIC-ADD ON

## 2015-07-11 LAB — URINALYSIS, ROUTINE W REFLEX MICROSCOPIC
Bilirubin Urine: NEGATIVE
Glucose, UA: NEGATIVE mg/dL
Ketones, ur: NEGATIVE mg/dL
Nitrite: POSITIVE — AB
PROTEIN: NEGATIVE mg/dL
SPECIFIC GRAVITY, URINE: 1.022 (ref 1.005–1.030)
pH: 6 (ref 5.0–8.0)

## 2015-07-11 MED ORDER — MENTHOL 3 MG MT LOZG
1.0000 | LOZENGE | OROMUCOSAL | Status: DC | PRN
  Filled 2015-07-11: qty 9

## 2015-07-11 MED ORDER — BACLOFEN 20 MG PO TABS
20.0000 mg | ORAL_TABLET | Freq: Every day | ORAL | Status: DC
  Administered 2015-07-11 – 2015-08-01 (×102): 20 mg via ORAL
  Filled 2015-07-11 (×2): qty 1
  Filled 2015-07-11: qty 2
  Filled 2015-07-11: qty 1
  Filled 2015-07-11 (×2): qty 2
  Filled 2015-07-11: qty 1
  Filled 2015-07-11 (×4): qty 2
  Filled 2015-07-11: qty 1
  Filled 2015-07-11 (×5): qty 2
  Filled 2015-07-11: qty 1
  Filled 2015-07-11 (×2): qty 2
  Filled 2015-07-11 (×2): qty 1
  Filled 2015-07-11 (×6): qty 2
  Filled 2015-07-11 (×2): qty 1
  Filled 2015-07-11 (×4): qty 2
  Filled 2015-07-11 (×2): qty 1
  Filled 2015-07-11: qty 2
  Filled 2015-07-11: qty 1
  Filled 2015-07-11: qty 2
  Filled 2015-07-11 (×2): qty 1
  Filled 2015-07-11: qty 2
  Filled 2015-07-11: qty 1
  Filled 2015-07-11 (×8): qty 2
  Filled 2015-07-11: qty 1
  Filled 2015-07-11 (×3): qty 2
  Filled 2015-07-11: qty 1
  Filled 2015-07-11: qty 2
  Filled 2015-07-11 (×2): qty 1
  Filled 2015-07-11 (×3): qty 2
  Filled 2015-07-11: qty 1
  Filled 2015-07-11 (×3): qty 2
  Filled 2015-07-11 (×3): qty 1
  Filled 2015-07-11 (×4): qty 2
  Filled 2015-07-11: qty 1
  Filled 2015-07-11 (×4): qty 2
  Filled 2015-07-11: qty 1
  Filled 2015-07-11: qty 2
  Filled 2015-07-11: qty 1
  Filled 2015-07-11 (×7): qty 2
  Filled 2015-07-11: qty 1
  Filled 2015-07-11 (×3): qty 2
  Filled 2015-07-11: qty 1
  Filled 2015-07-11 (×7): qty 2
  Filled 2015-07-11: qty 1
  Filled 2015-07-11 (×2): qty 2
  Filled 2015-07-11: qty 1
  Filled 2015-07-11 (×3): qty 2
  Filled 2015-07-11: qty 1

## 2015-07-11 MED ORDER — RIVAROXABAN 20 MG PO TABS
20.0000 mg | ORAL_TABLET | Freq: Every day | ORAL | Status: DC
  Administered 2015-07-11 – 2015-07-31 (×21): 20 mg via ORAL
  Filled 2015-07-11 (×20): qty 1

## 2015-07-11 MED ORDER — DEXAMETHASONE 4 MG PO TABS
4.0000 mg | ORAL_TABLET | Freq: Two times a day (BID) | ORAL | Status: DC
  Administered 2015-07-11 – 2015-07-17 (×13): 4 mg via ORAL
  Filled 2015-07-11 (×15): qty 1

## 2015-07-11 MED ORDER — SORBITOL 70 % SOLN
960.0000 mL | TOPICAL_OIL | Freq: Once | ORAL | Status: AC
  Administered 2015-07-11: 960 mL via RECTAL
  Filled 2015-07-11: qty 240

## 2015-07-11 MED ORDER — GUAIFENESIN-DM 100-10 MG/5ML PO SYRP: 5.0000 mL | ORAL_SOLUTION | Freq: Four times a day (QID) | ORAL | Status: DC | PRN

## 2015-07-11 MED ORDER — DEXAMETHASONE SODIUM PHOSPHATE 4 MG/ML IJ SOLN
4.0000 mg | Freq: Two times a day (BID) | INTRAMUSCULAR | Status: DC
  Filled 2015-07-11 (×14): qty 1

## 2015-07-11 MED ORDER — ALUM & MAG HYDROXIDE-SIMETH 200-200-20 MG/5ML PO SUSP: 30.0000 mL | ORAL | Status: DC | PRN

## 2015-07-11 MED ORDER — TRAZODONE HCL 50 MG PO TABS: 25.0000 mg | ORAL_TABLET | Freq: Every evening | ORAL | Status: DC | PRN

## 2015-07-11 MED ORDER — OXYCODONE-ACETAMINOPHEN 5-325 MG PO TABS
1.0000 | ORAL_TABLET | ORAL | Status: DC | PRN
  Administered 2015-07-11 – 2015-07-13 (×4): 1 via ORAL
  Administered 2015-07-13: 2 via ORAL
  Administered 2015-07-14 – 2015-07-18 (×4): 1 via ORAL
  Administered 2015-07-18: 2 via ORAL
  Administered 2015-07-19 – 2015-07-28 (×21): 1 via ORAL
  Administered 2015-07-29: 2 via ORAL
  Administered 2015-07-30 – 2015-08-01 (×4): 1 via ORAL
  Filled 2015-07-11 (×11): qty 1
  Filled 2015-07-11: qty 2
  Filled 2015-07-11 (×4): qty 1
  Filled 2015-07-11 (×3): qty 2
  Filled 2015-07-11: qty 1
  Filled 2015-07-11: qty 2
  Filled 2015-07-11 (×3): qty 1
  Filled 2015-07-11: qty 2
  Filled 2015-07-11 (×16): qty 1
  Filled 2015-07-11: qty 2
  Filled 2015-07-11 (×2): qty 1

## 2015-07-11 MED ORDER — PROCHLORPERAZINE MALEATE 5 MG PO TABS: 5.0000 mg | ORAL_TABLET | Freq: Four times a day (QID) | ORAL | Status: DC | PRN

## 2015-07-11 MED ORDER — ACETAMINOPHEN 325 MG PO TABS
325.0000 mg | ORAL_TABLET | ORAL | Status: DC | PRN
  Administered 2015-07-14 – 2015-07-17 (×6): 650 mg via ORAL
  Filled 2015-07-11 (×6): qty 2

## 2015-07-11 MED ORDER — LEVOTHYROXINE SODIUM 100 MCG PO TABS
200.0000 ug | ORAL_TABLET | Freq: Every day | ORAL | Status: DC
  Administered 2015-07-12 – 2015-08-01 (×21): 200 ug via ORAL
  Filled 2015-07-11 (×23): qty 2

## 2015-07-11 MED ORDER — POLYETHYLENE GLYCOL 3350 17 G PO PACK
17.0000 g | PACK | Freq: Once | ORAL | Status: DC
  Filled 2015-07-11: qty 1

## 2015-07-11 MED ORDER — FLEET ENEMA 7-19 GM/118ML RE ENEM
1.0000 | ENEMA | Freq: Once | RECTAL | Status: AC | PRN
  Administered 2015-07-11: 1 via RECTAL
  Filled 2015-07-11: qty 1

## 2015-07-11 MED ORDER — PHENOL 1.4 % MT LIQD: 1.0000 | OROMUCOSAL | Status: DC | PRN

## 2015-07-11 MED ORDER — PANTOPRAZOLE SODIUM 40 MG PO TBEC
40.0000 mg | DELAYED_RELEASE_TABLET | Freq: Every day | ORAL | Status: DC
  Administered 2015-07-12 – 2015-08-01 (×21): 40 mg via ORAL
  Filled 2015-07-11 (×22): qty 1

## 2015-07-11 MED ORDER — PRO-STAT SUGAR FREE PO LIQD
30.0000 mL | Freq: Two times a day (BID) | ORAL | Status: DC
  Administered 2015-07-11 – 2015-08-01 (×37): 30 mL via ORAL
  Filled 2015-07-11 (×41): qty 30

## 2015-07-11 MED ORDER — RIVAROXABAN 20 MG PO TABS
20.0000 mg | ORAL_TABLET | Freq: Every day | ORAL | Status: DC
  Filled 2015-07-11: qty 1

## 2015-07-11 MED ORDER — SENNA 8.6 MG PO TABS
2.0000 | ORAL_TABLET | Freq: Every day | ORAL | Status: DC
  Administered 2015-07-12: 17.2 mg via ORAL
  Filled 2015-07-11: qty 2

## 2015-07-11 MED ORDER — SERTRALINE HCL 50 MG PO TABS
25.0000 mg | ORAL_TABLET | Freq: Every day | ORAL | Status: DC
  Administered 2015-07-13 – 2015-08-01 (×16): 25 mg via ORAL
  Filled 2015-07-11 (×21): qty 1

## 2015-07-11 MED ORDER — PROCHLORPERAZINE 25 MG RE SUPP: 12.5000 mg | Freq: Four times a day (QID) | RECTAL | Status: DC | PRN

## 2015-07-11 MED ORDER — BISACODYL 10 MG RE SUPP
10.0000 mg | Freq: Every day | RECTAL | Status: DC | PRN
  Administered 2015-07-11: 10 mg via RECTAL
  Filled 2015-07-11: qty 1

## 2015-07-11 MED ORDER — DIAZEPAM 5 MG PO TABS
5.0000 mg | ORAL_TABLET | Freq: Four times a day (QID) | ORAL | Status: DC | PRN
  Administered 2015-07-12 – 2015-07-15 (×2): 5 mg via ORAL
  Filled 2015-07-11 (×2): qty 1

## 2015-07-11 MED ORDER — ZINC OXIDE 11.3 % EX CREA
1.0000 "application " | TOPICAL_CREAM | Freq: Every day | CUTANEOUS | Status: DC | PRN
  Filled 2015-07-11: qty 56

## 2015-07-11 MED ORDER — PROCHLORPERAZINE EDISYLATE 5 MG/ML IJ SOLN: 5.0000 mg | Freq: Four times a day (QID) | INTRAMUSCULAR | Status: DC | PRN

## 2015-07-11 NOTE — Progress Notes (Signed)
OT Cancellation Note  Patient Details Name: Sarah [name] MRN: [id number] DOB: 1965-05-27   Cancelled Treatment:    Reason Eval/Treat Not Completed: Other (comment) (Plan for d/c to CIR today; will defer OT to next venue.)  Binnie Kand M.S., OTR/L Pager: [phone]  07/11/2015, 1:58 PM

## 2015-07-11 NOTE — Progress Notes (Signed)
Pt to be transferred to rehab 4W19. Report called in to nurse, Elana.

## 2015-07-11 NOTE — H&P (View-Only) (Signed)
Physical Medicine and Rehabilitation Admission H&P    Chief Complaint  Patient presents with  . BLE spasticity with worsening of BLE Weakness due to pathologic fracture and metastatic  cancer.     HPI:  Sarah [name] is a 50 y.o. 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 T3 tumor resected 01/09/14 and subsequent chemo/XRT. She has been undergoing therapy with ability to transfer with SB/min assist and attempts at standing--06/24/15. She has had 3 week history of increase in spasticity and increase in BLE edema. She was admitted on 07/05/15 with B/B incontinence and decline in motor function. MRI done showing chronic T3 pathologic fracture with increased bulky epidural tumor with severe cord compression, new abnormal marrow signal T12- L3 with new osseous metastatic disease and similar findings S1. She was taken to OR emergently for T3 laminectomy for subtotal tumor resection by Dr. Christella Noa and treated with multiple units PRBC for ABLA.  currently on decadron taper, Xarelto to be resumed today and has plans for chemo in 2-3 weeks per input from  Hem/Onc at [facility].  Therapy ongoing and patient has had increase in spasticity, worsening of BLE weakenss with decline in functional mobility as well as ability to complete ADL tasks. CIR recommended for follow up therapy.      Review of Systems  Constitutional: Negative for fever and chills.  HENT: Negative for hearing loss.   Eyes: Negative for blurred vision and double vision.  Respiratory: Negative for cough, sputum production and shortness of breath.   Cardiovascular: Negative for chest pain and palpitations.  Gastrointestinal: Positive for diarrhea. Negative for heartburn, nausea and constipation.  Genitourinary: Positive for frequency. Negative for urgency.  Musculoskeletal: Negative for myalgias and back  pain.  Neurological: Positive for tingling, sensory change (decreased sensation BLE and saddle anesthesia.), focal weakness and weakness. Negative for dizziness and headaches.  Psychiatric/Behavioral: Negative for depression and memory loss. The patient is not nervous/anxious and does not have insomnia.   All other systems reviewed and are negative.     Past Medical History  Diagnosis Date  . Hypertension   . DDD (degenerative disc disease), cervical   . DJD (degenerative joint disease)   . Obesity   . Chronic back pain   . 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 History of rectal fissure   . Rotator cuff tendonitis right  . Pulmonary embolus, right ([place]) 2015  . HIT (heparin-induced thrombocytopenia) [facility])      Past Surgical History  Procedure Laterality Date  . Tonsillectomy    . Laminectomy N/A 12/14/2013    Procedure: THORACIC LAMINECTOMY FOR TUMOR THORACIC THREE;  Surgeon: Ashok Pall, MD;  Location: [place] NEURO ORS;  Service: Neurosurgery;  Laterality: N/A;  THORACIC LAMINECTOMY FOR TUMOR THORACIC THREE  . Posterior lumbar fusion 4 level N/A 12/30/2013    Procedure: Thoracic one-Thoracic five posterior thoracic fusion with pedicle screws;  Surgeon: Ashok Pall, MD;  Location: [place] NEURO ORS;  Service: Neurosurgery;  Laterality: N/A;  Thoracic one-Thoracic five posterior thoracic fusion with pedicle screws  . Thyroidectomy N/A 01/09/2014    Procedure: TOTAL THYROIDECTOMY;  Surgeon: Izora Gala, MD;  Location: [place];  Service: ENT;  Laterality: N/A;  . Laminectomy N/A 07/05/2015    Procedure: THORACIC LAMINECTOMY FOR TUMOR;  Surgeon: Ashok Pall, MD;  Location: [place] NEURO ORS;  Service: Neurosurgery;  Laterality: N/A;    Family History  Problem Relation Age of Onset  . Hypertension Mother   . Diabetes Mother   . Hypertension Father   . Stroke Father     Social History: Lives with sister. Independent for transfers at wheelchair level.  Was able to do sponge baths on BSC after set up. Sister checks in during the day. She reports that she has never smoked. She has never used smokeless tobacco. She reports that she does not drink alcohol or use illicit drugs.    Allergies  Allergen Reactions  . Keflex [Cephalexin] Nausea And Vomiting  . Tramadol Nausea And Vomiting  . Heparin Other (See Comments)    Weak positive platelets induced antibodies--2015  . Hydrocodone Nausea And Vomiting    Medications Prior to Admission  Medication Sig Dispense Refill  . baclofen (LIORESAL) 20 MG tablet Take 1 tablet (20 mg total) by mouth 3 (three) times daily. 120 each 4  . ibuprofen (ADVIL,MOTRIN) 200 MG tablet Take 400 mg by mouth every 6 (six) hours as needed for headache or moderate pain.    [name] Kitchen KLOR-CON M10 10 MEQ tablet TAKE ONE TABLET BY MOUTH ONCE DAILY 30 tablet 5  . levothyroxine (SYNTHROID, LEVOTHROID) 200 MCG tablet Take 200 mcg by mouth daily before breakfast.    . levothyroxine (SYNTHROID, LEVOTHROID) 75 MCG tablet Take by mouth.    [name] Kitchen lisinopril (PRINIVIL,ZESTRIL) 10 MG tablet Take 1 tablet (10 mg total) by mouth daily. 90 tablet 3  . ondansetron (ZOFRAN) 4 MG tablet Take 1 tablet (4 mg total) by mouth every 6 (six) hours. 30 tablet 3  . pantoprazole (PROTONIX) 40 MG tablet TAKE ONE TABLET BY MOUTH ONCE DAILY 30 tablet 5  . rivaroxaban (XARELTO) 20 MG TABS tablet Take 1 tablet (20 mg total) by mouth daily. 60 tablet 4  . sertraline (ZOLOFT) 25 MG tablet Take 1 tablet (25 mg total) by mouth daily. 30 tablet 5  . zinc oxide (BALMEX) 11.3 % CREA cream Apply 1 application topically daily as needed. Apply to rectum      Home: Home Living Family/patient expects to be discharged to:: Inpatient rehab Living Arrangements: Other relatives (sister and brother in law) Available Help at Discharge: Family, Available 24 hours/day Type of Home: House Home Access: Ramped entrance Home Layout: One level Bathroom Shower/Tub: Engineer, agricultural Accessibility: No Home Equipment: Bedside commode, Wheelchair - manual, Hospital bed Additional Comments: Pt lives with sister    Functional History: Prior Function Level of Independence: Needs assistance Gait / Transfers Assistance Needed: pt was slide board transfering and scooting to drop arm with MinG prior to decline.   ADL's / Homemaking Assistance Needed: Performed bed level baths and dressed herself.  Family performed homemaking tasks.    Functional Status:  Mobility: Bed Mobility Overal bed mobility: Needs Assistance Bed Mobility: Rolling, Sidelying to Sit, Sit to Sidelying Rolling: Min assist Sidelying to sit: +2 for physical assistance, Max assist Sit to sidelying: Max assist, +2 for physical assistance General bed mobility comments: Cues for log rolling technique.  Pt able to flex Rt knee for ease of rolling to the Lt.  Max assist to support trunk and manage Bil LEs w/ supine<>sit.  Blocked Bil knees during sidelying>sit to prevent knee extension Bil. Transfers Overall transfer level: Needs assistance Equipment used:  (lift equipment) Transfer via Lift Equipment:  (Bariatric stedy) Transfers: Sit to/from Stand Sit to Stand: From elevated surface, Max assist, +2 physical assistance General transfer  comment: Assist to boost up to 60% standing using bed pad and pt using strength in Bil LEs.  Rt LE flexes w/ sit>stand, Lt LE WBing but little to no involuntary contraction.        ADL: ADL Overall ADL's : Needs assistance/impaired Eating/Feeding: Independent Grooming: Wash/dry hands, Wash/dry face, Oral care, Applying deodorant, Brushing hair, Set up, Bed level Upper Body Bathing: Set up Lower Body Bathing: Maximal assistance, Bed level Upper Body Dressing : Bed level, Moderate assistance Lower Body Dressing: Total assistance, Bed level Toilet Transfer: Total assistance Toilet Transfer Details (indicate cue type and reason): unable to safely attempt   Toileting- Clothing Manipulation and Hygiene: Total assistance, Bed level Functional mobility during ADLs: Maximal assistance, +2 for physical assistance (bed mobility ) General ADL Comments: Session focused on bed mobility for hygiene after toileting. Radiology came to take pt for scan during session. Pt able to roll to side with max A to assist with BLE. Max A to clean pt. Pt states PTA, she completed bathing/dressing on BSC and was continenet of bowel/bladder. Pt states she feels she has more spasticity in BLE. Unable to actively dorsiflex B feet. Educated pt/family on importance of proper positioning and preventing foot drop. Will need PRAFO boots at this time.  Cognition: Cognition Overall Cognitive Status: Within Functional Limits for tasks assessed Orientation Level: Oriented X4 Cognition Arousal/Alertness: Awake/alert Behavior During Therapy: WFL for tasks assessed/performed Overall Cognitive Status: Within Functional Limits for tasks assessed   Blood pressure 118/84, pulse 74, temperature 98.1 F (36.7 C), temperature source Oral, resp. rate 20, height '5\' 10"'$  (1.778 m), weight 117.3 kg (258 lb 9.6 oz), last menstrual period 06/06/2015, SpO2 99 %. Physical Exam  Nursing note and vitals reviewed. Constitutional: She is oriented to person, place, and time. She appears well-developed and well-nourished.  Obese female.   HENT:  Head: Normocephalic and atraumatic.  Mouth/Throat: Oropharynx is clear and moist.  Eyes: Conjunctivae and EOM are normal. Pupils are equal, round, and reactive to light.  Neck: Normal range of motion. Neck supple.  Cardiovascular: Normal rate and regular rhythm.   No murmur heard. Respiratory: Effort normal. No stridor. She has decreased breath sounds in the right lower field and the left lower field. She has no wheezes. She has no rales.  GI: Soft. Bowel sounds are normal. She exhibits no distension. There is no tenderness. There is no guarding.   Genitourinary:  Rectal spincter lax due to lack of rectal tone. Rectal tags noted. Soft stool felt just beyond anal verge. Foley in place.   Musculoskeletal: She exhibits no edema or tenderness.  Neurological: She is alert and oriented to person, place, and time. She displays abnormal reflex. She exhibits abnormal muscle tone. Coordination abnormal.  Flat but speech clear and able to follow commands without difficulty.   BUE 5/5 proximal to distal.  RLE: hip flexion, knee extension 2+/5, ankle dorsi/plantar flexion 3+/5 LLE: 2-/5 proximal to distal  Extensor tone.  Tight heel cords bilaterally.  DTR 3+ b/l LE  Skin: Skin is warm and dry.  Upper thoracic incision with dry scabs under honeycomb dressing.   Psychiatric: Her speech is normal and behavior is normal. Judgment and thought content normal. Her affect is blunt. Cognition and memory are normal.    Results for orders placed or performed during the hospital encounter of 07/05/15 (from the past 48 hour(s))  Glucose, capillary     Status: Abnormal   Collection Time: 07/09/15  4:35 PM  Result Value Ref  Range   Glucose-Capillary 121 (H) 65 - 99 mg/dL  Glucose, capillary     Status: Abnormal   Collection Time: 07/09/15  8:54 PM  Result Value Ref Range   Glucose-Capillary 174 (H) 65 - 99 mg/dL   Comment 1 Notify RN    Comment 2 Document in Chart   Glucose, capillary     Status: Abnormal   Collection Time: 07/10/15 12:05 AM  Result Value Ref Range   Glucose-Capillary 134 (H) 65 - 99 mg/dL   Comment 1 Notify RN    Comment 2 Document in Chart   Glucose, capillary     Status: Abnormal   Collection Time: 07/10/15  5:14 AM  Result Value Ref Range   Glucose-Capillary 115 (H) 65 - 99 mg/dL   Comment 1 Notify RN    Comment 2 Document in Chart   Glucose, capillary     Status: None   Collection Time: 07/10/15  8:03 AM  Result Value Ref Range   Glucose-Capillary 99 65 - 99 mg/dL  Glucose, capillary     Status: None   Collection Time:  07/10/15 11:36 AM  Result Value Ref Range   Glucose-Capillary 93 65 - 99 mg/dL  Glucose, capillary     Status: Abnormal   Collection Time: 07/10/15  4:47 PM  Result Value Ref Range   Glucose-Capillary 191 (H) 65 - 99 mg/dL  Glucose, capillary     Status: Abnormal   Collection Time: 07/10/15  8:06 PM  Result Value Ref Range   Glucose-Capillary 131 (H) 65 - 99 mg/dL   Comment 1 Notify RN    Comment 2 Document in Chart   Glucose, capillary     Status: Abnormal   Collection Time: 07/10/15 11:57 PM  Result Value Ref Range   Glucose-Capillary 106 (H) 65 - 99 mg/dL   Comment 1 Notify RN    Comment 2 Document in Chart   Glucose, capillary     Status: Abnormal   Collection Time: 07/11/15  3:50 AM  Result Value Ref Range   Glucose-Capillary 114 (H) 65 - 99 mg/dL   Comment 1 Notify RN    Comment 2 Document in Chart   Glucose, capillary     Status: Abnormal   Collection Time: 07/11/15  7:37 AM  Result Value Ref Range   Glucose-Capillary 113 (H) 65 - 99 mg/dL  Glucose, capillary     Status: None   Collection Time: 07/11/15 11:12 AM  Result Value Ref Range   Glucose-Capillary 99 65 - 99 mg/dL    Dg Abd 1 View  07/09/2015  CLINICAL DATA:  Loose stools EXAM: ABDOMEN - 1 VIEW COMPARISON:  None. FINDINGS: Scattered large and small bowel gas is noted. Fecal material is noted throughout the colon. No obstructive changes are seen. No abnormal mass or abnormal calcifications are noted. Degenerative changes of lumbar spine and hip joints are seen. IMPRESSION: No acute abnormality noted. Electronically Signed   By: Inez Catalina M.D.   On: 07/09/2015 12:41    Medical Problem List and Plan: 1. 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 H/o PE/Anticoagulation: Pharmaceutical: Xarelto 3. Pain Management:  Controlled currently on oxycodone prn.  4. Mood:  LCSW to follow for evaluation and support.  Ego support provided. She is aware of hard work ahead of her but has good social supports at home.  5. Neuropsych: This patient is  capable of making decisions  on her own behalf. 6. Skin/Wound Care: Routine pressure relief measures. Continue to monitor thoracic incision for healing as well as skin due to bowel incontinence.  7. Fluids/Electrolytes/Nutrition: Monitor I/O. Appetite has been good. Check lytes in am.  8.Metastatic cancer with T3 pathologic FX with cord compression and new osseous mets T12- L3/S1:  9. Steroid induced Hyperglycemia:  Blood sugars improving with decadron taper. Will monitor BID for now.  10. HTN: Continue to monitor BP bid--off prinivil at this time as blood pressures controlled. May need to be resumed as activity level improves  11. Spasticity: Continue baclofen 20 mg 5 X day with valium prn.  12.  ABLA: Will monitor for now. Check CBC in am.  13. Leucocytosis: Likely steroid induced. Will check UA/UCS with  14. Hypothyroid s/p thyroidectomy for hurtle cell thyroid cancer:  76. H/o depression: Resume home dose Zoloft. 15. Neurogenic bowel with Obstipation:  Start bowel program--d/c colace.  Start Senna 2 bid and use SSE today.  Will need daily suppository with dig stim in am.  16. Neurogenic bladder: Was able to void at home. Remove foley in am and start bladder training. Check UA/UCS.    Post Admission Physician Evaluation: 1. 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 [name] admitted to receive collaborative, interdisciplinary care between the physiatrist, rehab nursing staff, and therapy team. 3. Patient's level of medical complexity and substantial therapy needs in context of that medical necessity cannot be provided at a lesser intensity of care such as a SNF. 4. Patient has experienced substantial functional loss from his/her baseline which was documented above under the "Functional  History" and "Functional Status" headings.  Judging by the patient's diagnosis, physical exam, and functional history, the patient has potential for functional progress which will result in measurable gains while on inpatient rehab.  These gains will be of substantial and practical use upon discharge  in facilitating mobility and self-care at the household level. 5. Physiatrist will provide 24 hour management of medical needs as well as oversight of the therapy plan/treatment and provide guidance as appropriate regarding the interaction of the two. 6. 24 hour rehab nursing will assist with bladder management, bowel management, safety, skin/wound care, disease management, medication administration and pain management and help integrate therapy concepts, techniques,education, etc. 7. PT will assess and treat for/with: Lower extremity strength, range of motion, stamina, balance, functional mobility, safety, adaptive techniques and equipment, woundcare, coping skills, pain control, education.   Goals are: Mod I at wheelchair level. 8. OT will assess and treat for/with: ADL's, functional mobility, safety, upper extremity strength, adaptive techniques and equipment, wound mgt, ego support, and community reintegration.   Goals are: Mod I at wheelchair level. Therapy may not proceed with showering this patient. 9. SLP will assess and treat for/with: swallowing.  Goals are: Ind. 10. Case Management and Social Worker will assess and treat for psychological issues and discharge planning. 11. Team conference will be held weekly to assess progress toward goals and to determine barriers to discharge. 12. Patient will receive at least 3 hours of therapy per day at least 5 days per week. 13. ELOS: 12-17 days.       14. Prognosis:  good and fair  Delice Lesch, MD 07/11/2015

## 2015-07-11 NOTE — Progress Notes (Signed)
Sarah Staggers, MD Physician Signed Physical Medicine and Rehabilitation Consult Note 07/06/2015 8:19 AM  Related encounter: ED to Hosp-Admission (Current) from 07/05/2015 in [place] Collapse All       Physical Medicine and Rehabilitation Consult  Reason for Consult: Recurrent metastatic tumor at T3 Referring Physician: Dr. Christella Noa   HPI: THANA RAMP is a 50 y.o. 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 T3 tumor resected 01/09/14 and subsequent chemo/XRT. She has been undergoing therapy with ability to transfer with SB/min assist and attempts at standing--06/24/15. She has had 3 week history of increase in spasticity and increase in BLE edema. She was admitted on 07/05/15 with B/B incontinence and decline in motor function. MRI done showing chronic T3 pathologic fracture with increased bulky epidural tumor with severe cord compression, new abnormal marrow signal T12- L3 with new osseous metastatic disease and similar findings S1. She was taken to OR emergently for T3 laminectomy for subtotal tumor resection by Dr. Christella Noa. Therapy evaluation to be done today? CIR consulted in anticipated of ongoing rehab needs.    Review of Systems  Unable to perform ROS: medical condition      Past Medical History  Diagnosis Date  . Hypertension   . DDD (degenerative disc disease), cervical   . DJD (degenerative joint disease)   . Obesity   . Chronic back pain   . Cancer [facility])     FNA of thyroid positive for onconytic/hurthle cell carcinoma  . History of rectal fissure   . Rotator cuff tendonitis right    Past Surgical History  Procedure Laterality Date  . Tonsillectomy    . Laminectomy N/A 12/14/2013    Procedure: THORACIC LAMINECTOMY FOR TUMOR  THORACIC THREE; Surgeon: Ashok Pall, MD; Location: [place] NEURO ORS; Service: Neurosurgery; Laterality: N/A; THORACIC LAMINECTOMY FOR TUMOR THORACIC THREE  . Posterior lumbar fusion 4 level N/A 12/30/2013    Procedure: Thoracic one-Thoracic five posterior thoracic fusion with pedicle screws; Surgeon: Ashok Pall, MD; Location: [place] NEURO ORS; Service: Neurosurgery; Laterality: N/A; Thoracic one-Thoracic five posterior thoracic fusion with pedicle screws  . Thyroidectomy N/A 01/09/2014    Procedure: TOTAL THYROIDECTOMY; Surgeon: Izora Gala, MD; Location[facility] OR; Service: ENT; Laterality: N/A;    Family History  Problem Relation Age of Onset  . Hypertension Mother   . Diabetes Mother   . Hypertension Father   . Stroke Father     Social History: reports that she has never smoked. She has never used smokeless tobacco. She reports that she does not drink alcohol or use illicit drugs.    Allergies  Allergen Reactions  . Keflex [Cephalexin] Nausea And Vomiting  . Tramadol Nausea And Vomiting  . Hydrocodone Nausea And Vomiting    Medications Prior to Admission  Medication Sig Dispense Refill  . baclofen (LIORESAL) 20 MG tablet Take 1 tablet (20 mg total) by mouth 3 (three) times daily. 120 each 4  . ibuprofen (ADVIL,MOTRIN) 200 MG tablet Take 400 mg by mouth every 6 (six) hours as needed for headache or moderate pain.    [name] Kitchen KLOR-CON M10 10 MEQ tablet TAKE ONE TABLET BY MOUTH ONCE DAILY 30 tablet 5  . levothyroxine (SYNTHROID, LEVOTHROID) 200 MCG tablet Take 200 mcg by mouth daily before breakfast.    . levothyroxine (SYNTHROID, LEVOTHROID) 75 MCG tablet Take by mouth.    [name] Kitchen  lisinopril (PRINIVIL,ZESTRIL) 10 MG tablet Take 1 tablet (10 mg total) by mouth daily. 90 tablet 3  . ondansetron (ZOFRAN) 4 MG tablet Take 1 tablet (4 mg total) by mouth every 6 (six) hours. 30 tablet 3  . pantoprazole  (PROTONIX) 40 MG tablet TAKE ONE TABLET BY MOUTH ONCE DAILY 30 tablet 5  . rivaroxaban (XARELTO) 20 MG TABS tablet Take 1 tablet (20 mg total) by mouth daily. 60 tablet 4  . sertraline (ZOLOFT) 25 MG tablet Take 1 tablet (25 mg total) by mouth daily. 30 tablet 5  . zinc oxide (BALMEX) 11.3 % CREA cream Apply 1 application topically daily as needed. Apply to rectum      Home:    Functional History:   Functional Status:  Mobility:          ADL:    Cognition: Cognition Orientation Level: Intubated/Tracheostomy - Unable to assess     Blood pressure 131/74, pulse 76, temperature 97.8 F (36.6 C), temperature source Axillary, resp. rate 16, height '5\' 10"'$  (1.778 m), weight 117.3 kg (258 lb 9.6 oz), last menstrual period 06/06/2015, SpO2 100 %. Physical Exam  Constitutional: She appears well-developed.  Cardiovascular: Normal rate.  Respiratory:  Intubated, appears comfortable  Neurological:  Pt intubated, difficult to arouse.     Lab Results Last 24 Hours    Results for orders placed or performed during the hospital encounter of 07/05/15 (from the past 24 hour(s))  CBC with Differential/Platelet Status: Abnormal   Collection Time: 07/05/15 5:07 PM  Result Value Ref Range   WBC 4.8 4.0 - 10.5 K/uL   RBC 3.56 (L) 3.87 - 5.11 MIL/uL   Hemoglobin 10.2 (L) 12.0 - 15.0 g/dL   HCT 30.9 (L) 36.0 - 46.0 %   MCV 86.8 78.0 - 100.0 fL   MCH 28.7 26.0 - 34.0 pg   MCHC 33.0 30.0 - 36.0 g/dL   RDW 13.2 11.5 - 15.5 %   Platelets 203 150 - 400 K/uL   Neutrophils Relative % 67 %   Neutro Abs 3.3 1.7 - 7.7 K/uL   Lymphocytes Relative 24 %   Lymphs Abs 1.1 0.7 - 4.0 K/uL   Monocytes Relative 7 %   Monocytes Absolute 0.3 0.1 - 1.0 K/uL   Eosinophils Relative 2 %   Eosinophils Absolute 0.1 0.0 - 0.7 K/uL   Basophils Relative 0 %   Basophils Absolute 0.0 0.0 - 0.1 K/uL    Comprehensive metabolic panel Status: Abnormal   Collection Time: 07/05/15 5:07 PM  Result Value Ref Range   Sodium 139 135 - 145 mmol/L   Potassium 4.1 3.5 - 5.1 mmol/L   Chloride 106 101 - 111 mmol/L   CO2 26 22 - 32 mmol/L   Glucose, Bld 96 65 - 99 mg/dL   BUN 15 6 - 20 mg/dL   Creatinine, Ser 1.06 (H) 0.44 - 1.00 mg/dL   Calcium 8.5 (L) 8.9 - 10.3 mg/dL   Total Protein 6.5 6.5 - 8.1 g/dL   Albumin 3.4 (L) 3.5 - 5.0 g/dL   AST 17 15 - 41 U/L   ALT 11 (L) 14 - 54 U/L   Alkaline Phosphatase 94 38 - 126 U/L   Total Bilirubin 1.3 (H) 0.3 - 1.2 mg/dL   GFR calc non Af Amer >60 >60 mL/min   GFR calc Af Amer >60 >60 mL/min   Anion gap 7 5 - 15  Urinalysis, Routine w reflex microscopic (not at [facility]) Status: None   Collection Time:  07/05/15 7:39 PM  Result Value Ref Range   Color, Urine YELLOW YELLOW   APPearance CLEAR CLEAR   Specific Gravity, Urine 1.018 1.005 - 1.030   pH 7.0 5.0 - 8.0   Glucose, UA NEGATIVE NEGATIVE mg/dL   Hgb urine dipstick NEGATIVE NEGATIVE   Bilirubin Urine NEGATIVE NEGATIVE   Ketones, ur NEGATIVE NEGATIVE mg/dL   Protein, ur NEGATIVE NEGATIVE mg/dL   Nitrite NEGATIVE NEGATIVE   Leukocytes, UA NEGATIVE NEGATIVE  APTT Status: None   Collection Time: 07/05/15 8:58 PM  Result Value Ref Range   aPTT 37 24 - 37 seconds  Protime-INR Status: Abnormal   Collection Time: 07/05/15 8:58 PM  Result Value Ref Range   Prothrombin Time 17.7 (H) 11.6 - 15.2 seconds   INR 1.45 0.00 - 1.49  Type and screen Status: None (Preliminary result)   Collection Time: 07/05/15 11:00 PM  Result Value Ref Range   ABO/RH(D) O POS    Antibody Screen NEG    Sample Expiration 07/08/2015    Unit Number [id number]    Blood Component Type RED CELLS,LR    Unit division 00     Status of Unit ISSUED    Transfusion Status OK TO TRANSFUSE    Crossmatch Result Compatible    Unit Number [id number]    Blood Component Type RED CELLS,LR    Unit division 00    Status of Unit ISSUED    Transfusion Status OK TO TRANSFUSE    Crossmatch Result Compatible    Unit Number [id number]    Blood Component Type RBC LR PHER2    Unit division 00    Status of Unit ALLOCATED    Transfusion Status OK TO TRANSFUSE    Crossmatch Result Compatible    Unit Number [id number]    Blood Component Type RED CELLS,LR    Unit division 00    Status of Unit ISSUED    Transfusion Status OK TO TRANSFUSE    Crossmatch Result Compatible    Unit Number [id number]    Blood Component Type RBC LR PHER2    Unit division 00    Status of Unit ALLOCATED    Transfusion Status OK TO TRANSFUSE    Crossmatch Result Compatible    Unit Number [id number]    Blood Component Type RBC LR PHER1    Unit division 00    Status of Unit ALLOCATED    Transfusion Status OK TO TRANSFUSE    Crossmatch Result Compatible   Prepare RBC (crossmatch) Status: None   Collection Time: 07/05/15 11:21 PM  Result Value Ref Range   Order Confirmation ORDER PROCESSED BY BLOOD BANK   I-STAT 7, (LYTES, BLD GAS, ICA, H+H) Status: Abnormal   Collection Time: 07/05/15 11:49 PM  Result Value Ref Range   pH, Arterial 7.407 7.350 - 7.450   pCO2 arterial 35.9 35.0 - 45.0 mmHg   pO2, Arterial 244.0 (H) 80.0 - 100.0 mmHg   Bicarbonate 22.6 20.0 - 24.0 mEq/L   TCO2 24 0 - 100 mmol/L   O2 Saturation 100.0 %   Acid-base deficit 2.0 0.0 - 2.0 mmol/L   Sodium 136 135 - 145 mmol/L   Potassium 3.5 3.5 - 5.1 mmol/L   Calcium, Ion 1.06 (L) 1.12 - 1.23 mmol/L   HCT 23.0 (L) 36.0 - 46.0 %   Hemoglobin 7.8 (L) 12.0 - 15.0 g/dL    Patient temperature 36.9 C    Collection site RADIAL, ALLEN'S TEST ACCEPTABLE    Sample type ARTERIAL  I-STAT 7, (LYTES, BLD GAS, ICA, H+H) Status: Abnormal   Collection Time: 07/06/15 12:59 AM  Result Value Ref Range   pH, Arterial 7.374 7.350 - 7.450   pCO2 arterial 41.3 35.0 - 45.0 mmHg   pO2, Arterial 243.0 (H) 80.0 - 100.0 mmHg   Bicarbonate 24.2 (H) 20.0 - 24.0 mEq/L   TCO2 26 0 - 100 mmol/L   O2 Saturation 100.0 %   Acid-base deficit 1.0 0.0 - 2.0 mmol/L   Sodium 134 (L) 135 - 145 mmol/L   Potassium 4.4 3.5 - 5.1 mmol/L   Calcium, Ion 1.06 (L) 1.12 - 1.23 mmol/L   HCT 35.0 (L) 36.0 - 46.0 %   Hemoglobin 11.9 (L) 12.0 - 15.0 g/dL   Patient temperature 36.5 C    Collection site RADIAL, ALLEN'S TEST ACCEPTABLE    Sample type ARTERIAL   MRSA PCR Screening Status: None   Collection Time: 07/06/15 3:35 AM  Result Value Ref Range   MRSA by PCR NEGATIVE NEGATIVE  Triglycerides Status: None   Collection Time: 07/06/15 4:00 AM  Result Value Ref Range   Triglycerides 101 <150 mg/dL  Basic metabolic panel Status: Abnormal   Collection Time: 07/06/15 4:00 AM  Result Value Ref Range   Sodium 136 135 - 145 mmol/L   Potassium 4.1 3.5 - 5.1 mmol/L   Chloride 103 101 - 111 mmol/L   CO2 22 22 - 32 mmol/L   Glucose, Bld 182 (H) 65 - 99 mg/dL   BUN 13 6 - 20 mg/dL   Creatinine, Ser 1.05 (H) 0.44 - 1.00 mg/dL   Calcium 8.7 (L) 8.9 - 10.3 mg/dL   GFR calc non Af Amer >60 >60 mL/min   GFR calc Af Amer >60 >60 mL/min   Anion gap 11 5 - 15  CBC with Differential/Platelet Status: Abnormal   Collection Time: 07/06/15 7:00 AM  Result Value Ref Range   WBC 5.7 4.0 - 10.5 K/uL   RBC 4.01 3.87 - 5.11 MIL/uL   Hemoglobin 11.8 (L) 12.0 - 15.0 g/dL   HCT 33.7 (L) 36.0 - 46.0 %   MCV 84.0 78.0 - 100.0  fL   MCH 29.4 26.0 - 34.0 pg   MCHC 35.0 30.0 - 36.0 g/dL   RDW 12.9 11.5 - 15.5 %   Platelets 167 150 - 400 K/uL   Neutrophils Relative % 87 %   Neutro Abs 5.0 1.7 - 7.7 K/uL   Lymphocytes Relative 11 %   Lymphs Abs 0.6 (L) 0.7 - 4.0 K/uL   Monocytes Relative 2 %   Monocytes Absolute 0.1 0.1 - 1.0 K/uL   Eosinophils Relative 0 %   Eosinophils Absolute 0.0 0.0 - 0.7 K/uL   Basophils Relative 0 %   Basophils Absolute 0.0 0.0 - 0.1 K/uL  Magnesium Status: None   Collection Time: 07/06/15 7:00 AM  Result Value Ref Range   Magnesium 1.7 1.7 - 2.4 mg/dL  Phosphorus Status: None   Collection Time: 07/06/15 7:00 AM  Result Value Ref Range   Phosphorus 3.8 2.5 - 4.6 mg/dL  APTT Status: None   Collection Time: 07/06/15 7:00 AM  Result Value Ref Range   aPTT 32 24 - 37 seconds  Protime-INR Status: Abnormal   Collection Time: 07/06/15 7:00 AM  Result Value Ref Range   Prothrombin Time 16.6 (H) 11.6 - 15.2 seconds   INR 1.33 0.00 - 1.49      Imaging Results (Last 48 hours)    Mr Thoracic Spine W  Wo Contrast  07/05/2015 ADDENDUM REPORT: 07/05/2015 18:58 ADDENDUM: Critical Value/emergent results were called by telephone at the time of interpretation on 07/05/2015 at 1711 hours to ER provider Alvie Heidelberg, [facility], who verbally acknowledged these results. Electronically Signed By: Genevie Ann M.D. On: 07/05/2015 18:58  07/05/2015 CLINICAL DATA: 50 year old female with thyroid cancer metastatic to the spine. T3 pathologic fracture treated with surgery including fusion from T1-T5. Worsening lower extremity weakness for 3 weeks. Subsequent encounter. EXAM: MRI THORACIC AND LUMBAR SPINE WITHOUT AND WITH CONTRAST TECHNIQUE: Multiplanar and multiecho pulse sequences of the thoracic and lumbar spine were obtained without and with intravenous contrast. CONTRAST: 58m MULTIHANCE  GADOBENATE DIMEGLUMINE 529 MG/ML IV SOLN COMPARISON: Thoracic spine MRI 12/14/2013. PET-CT 12/22/2014. Lumbar MRI 12/10/2013. Cervical spine MRI 12/14/2013 FINDINGS: MR THORACIC SPINE FINDINGS Limited sagittal imaging of the cervical spine today appears grossly stable to that in 2015. Pathologic T3 compression fracture re- demonstrated with bulky epidural tumor resulting in moderate to severe cord compression (series 8, image 9). This epidural tumor is chronic but progressed compared to 2015 (series 1000, image 8 on 12/14/2013). Evidence of associated cord edema just below the maximal compressive level at T3-T4 (series 8, image 13 and series 7, image 8). Hardware susceptibility artifact in this region, no abnormal spinal cord or intradural enhancement identified. Excluding T12 (see below) no other thoracic vertebral metastatic disease. Stable thoracic vertebral height and alignment elsewhere. No posterior rib metastasis identified. Chronic but increased thoracic epidural lipomatosis. When combined with a chronic degenerative T7-T8 disc herniation (series 8, image 23) there is increased, now mild to moderate degenerative spinal stenosis without associated cord signal abnormality. Smaller chronic left paracentral disc protrusion at T8-T9. No other thoracic spinal stenosis. The conus medullaris appears normal at T12-L1. Posterior paraspinal soft tissues are within normal limits. MR LUMBAR SPINE FINDINGS Normal lumbar segmentation, concordant with the thoracic numbering. New decreased T1 marrow signal with enhancement in the anterior T12, L1, L2 and L3 vertebrae. Similar findings at the anterior superior S1 vertebral body. There does not appear to be associated interval disc degeneration at these levels to explain the marrow signal changes on the basis of degenerative etiology. Visible sacral ala and medial iliac bones remain normal. No associated pathologic fracture. Visualized lower thoracic spinal cord is normal  with conus medularis at L1-L2. No abnormal intradural enhancement. Negative visualized abdominal viscera. Increased lumbar epidural lipomatosis in conjunction with progressed L3 through S1 disc, endplate, and facet degeneration. Subsequent new moderate to severe degenerative etiology spinal stenosis at: L3-L4 (series 16, image 22), L4-L5 (series 16, image 27), and L5-S1 (greater on the left, series 16, image 33). There is degenerative appearing mild soft tissue inflammation about the left L4-L5 facet joint. IMPRESSION: 1. Chronic T3 pathologic fracture with chronic but increased bulky epidural tumor and progressed Severe Malignant Cord Compression. Associated mild spinal cord edema. 2. New abnormal marrow signal along the anterior vertebral bodies T12 through L3 most compatible with new osseous metastatic disease. Similar finding in the S1 vertebra. No associated pathologic fracture or epidural tumor at these levels. 3. No other thoracic metastatic disease identified. 4. Superimposed progressed degenerative mid thoracic and lower lumbar spinal stenosis related to disc disease, epidural lipomatosis, and posterior element degeneration. Electronically Signed: By: HGenevie AnnM.D. On: 07/05/2015 17:08   Mr Lumbar Spine W Wo Contrast  07/05/2015 ADDENDUM REPORT: 07/05/2015 18:58 ADDENDUM: Critical Value/emergent results were called by telephone at the time of interpretation on 07/05/2015 at 1711 hours to ER provider TAlvie Heidelberg [place] who  verbally acknowledged these results. Electronically Signed By: Genevie Ann M.D. On: 07/05/2015 18:58  07/05/2015 CLINICAL DATA: 50 year old female with thyroid cancer metastatic to the spine. T3 pathologic fracture treated with surgery including fusion from T1-T5. Worsening lower extremity weakness for 3 weeks. Subsequent encounter. EXAM: MRI THORACIC AND LUMBAR SPINE WITHOUT AND WITH CONTRAST TECHNIQUE: Multiplanar and multiecho pulse sequences of the thoracic and lumbar spine  were obtained without and with intravenous contrast. CONTRAST: 80m MULTIHANCE GADOBENATE DIMEGLUMINE 529 MG/ML IV SOLN COMPARISON: Thoracic spine MRI 12/14/2013. PET-CT 12/22/2014. Lumbar MRI 12/10/2013. Cervical spine MRI 12/14/2013 FINDINGS: MR THORACIC SPINE FINDINGS Limited sagittal imaging of the cervical spine today appears grossly stable to that in 2015. Pathologic T3 compression fracture re- demonstrated with bulky epidural tumor resulting in moderate to severe cord compression (series 8, image 9). This epidural tumor is chronic but progressed compared to 2015 (series 1000, image 8 on 12/14/2013). Evidence of associated cord edema just below the maximal compressive level at T3-T4 (series 8, image 13 and series 7, image 8). Hardware susceptibility artifact in this region, no abnormal spinal cord or intradural enhancement identified. Excluding T12 (see below) no other thoracic vertebral metastatic disease. Stable thoracic vertebral height and alignment elsewhere. No posterior rib metastasis identified. Chronic but increased thoracic epidural lipomatosis. When combined with a chronic degenerative T7-T8 disc herniation (series 8, image 23) there is increased, now mild to moderate degenerative spinal stenosis without associated cord signal abnormality. Smaller chronic left paracentral disc protrusion at T8-T9. No other thoracic spinal stenosis. The conus medullaris appears normal at T12-L1. Posterior paraspinal soft tissues are within normal limits. MR LUMBAR SPINE FINDINGS Normal lumbar segmentation, concordant with the thoracic numbering. New decreased T1 marrow signal with enhancement in the anterior T12, L1, L2 and L3 vertebrae. Similar findings at the anterior superior S1 vertebral body. There does not appear to be associated interval disc degeneration at these levels to explain the marrow signal changes on the basis of degenerative etiology. Visible sacral ala and medial iliac bones remain normal. No  associated pathologic fracture. Visualized lower thoracic spinal cord is normal with conus medularis at L1-L2. No abnormal intradural enhancement. Negative visualized abdominal viscera. Increased lumbar epidural lipomatosis in conjunction with progressed L3 through S1 disc, endplate, and facet degeneration. Subsequent new moderate to severe degenerative etiology spinal stenosis at: L3-L4 (series 16, image 22), L4-L5 (series 16, image 27), and L5-S1 (greater on the left, series 16, image 33). There is degenerative appearing mild soft tissue inflammation about the left L4-L5 facet joint. IMPRESSION: 1. Chronic T3 pathologic fracture with chronic but increased bulky epidural tumor and progressed Severe Malignant Cord Compression. Associated mild spinal cord edema. 2. New abnormal marrow signal along the anterior vertebral bodies T12 through L3 most compatible with new osseous metastatic disease. Similar finding in the S1 vertebra. No associated pathologic fracture or epidural tumor at these levels. 3. No other thoracic metastatic disease identified. 4. Superimposed progressed degenerative mid thoracic and lower lumbar spinal stenosis related to disc disease, epidural lipomatosis, and posterior element degeneration. Electronically Signed: By: HGenevie AnnM.D. On: 07/05/2015 17:08   Portable Chest Xray  07/06/2015 CLINICAL DATA: Endotracheal tube placement. Initial encounter. EXAM: PORTABLE CHEST 1 VIEW COMPARISON: Chest radiograph performed 07/02/2015 FINDINGS: The patient's endotracheal tube is seen ending 2 cm above the carina. The lungs are well-aerated. Mild left basilar opacity may reflect atelectasis or possibly mild pneumonia. There is no evidence of pleural effusion or pneumothorax. The cardiomediastinal silhouette is borderline normal in size. No acute osseous  abnormalities are seen. Thoracic spinal fusion hardware is noted. IMPRESSION: 1. Endotracheal tube seen ending 2 cm above the carina. 2. Mild left  basilar airspace opacity may reflect atelectasis or possibly mild pneumonia. Electronically Signed By: Garald Balding M.D. 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 Does the need for close, 24 hr/day medical supervision in concert with the patient's rehab needs make it unreasonable for this patient to be served in a less intensive setting? Potentially 2. Co-Morbidities requiring supervision/potential complications: neurogenic bowel/bladder, spasticity, etc 3. Due to bladder management, bowel management, safety, skin/wound care, disease management, medication administration and pain management, does the patient require 24 hr/day rehab nursing? Potentially 4. Does the patient require coordinated care of a physician, rehab nurse, PT (1-2 hrs/day, 5 days/week) and OT (1-2 hrs/day, 5 days/week) to address physical and functional deficits in the context of the above medical diagnosis(es)? Potentially Addressing deficits in the following areas: balance, endurance, locomotion, strength, transferring, bowel/bladder control, bathing, dressing, feeding, grooming, toileting and psychosocial support 5. Can the patient actively participate in an intensive therapy program of at least 3 hrs of therapy per day at least 5 days per week? Potentially 6. The potential for patient to make measurable gains while on inpatient rehab is good 7. Anticipated functional outcomes upon discharge from inpatient rehab are TBD with PT, TBD with OT, TBd with SLP. 8. Estimated rehab length of stay to reach the above functional goals is: to be determined 9. Does the patient have adequate social supports and living environment to accommodate these discharge functional goals? Yes 10. Anticipated D/C setting: Home 11. Anticipated post D/C treatments: HH therapy and Outpatient therapy 12. Overall Rehab/Functional Prognosis: good and  fair  RECOMMENDATIONS: This patient's condition is appropriate for continued rehabilitative care in the following setting: likely inpatient rehab Patient has agreed to participate in recommended program. Potentially Note that insurance prior authorization may be required for reimbursement for recommended care.  Comment: Pt is WELL known to me. She is extremely motivated and has a supportive family. Had been making nice functional progress prior to this hospitalization. Currently on vent. Will follow along for medical and functional progress.   Sarah Staggers, MD, [facility] 07/06/2015     07/06/2015       Revision History     Date/Time User Provider Type Action   07/06/2015 10:17 AM Sarah Staggers, MD Physician Sign   07/06/2015 8:42 AM Bary Leriche, PA-C Physician Assistant Pend   View Details Report       Routing History     Date/Time From To Method   07/06/2015 10:17 AM Sarah Staggers, MD Elberta Leatherwood, MD In Basket

## 2015-07-11 NOTE — Progress Notes (Signed)
I have reviewed Dr. Lacy Duverney note from last night with plans for rehab today. Discharge summary has been dictated. Pt. Is in agreement with [facility] Rehab.  I will arrange for admission later today and once DC order is placed in chart.  I have updated Jacqualin Combes, RNCM as well as pt's Radiographer, therapeutic.  Please call if questions.  [place] Admissions Coordinator Cell [phone] Office [phone]

## 2015-07-11 NOTE — Progress Notes (Signed)
Sarah [name] Rehab Admission Coordinator Signed Physical Medicine and Rehabilitation PMR Pre-admission 07/10/2015 11:38 AM  Related encounter: ED to Hosp-Admission (Current) from 07/05/2015 in [facility] [facility]   PMR Admission Coordinator Pre-Admission Assessment  Patient: Sarah [name] is an 50 y.o., female MRN: [id number] DOB: February 14, 1966 Height: '5\' 10"'$  (177.8 cm) Weight: 117.3 kg (258 lb 9.6 oz)  Insurance Information HMO: PPO: yes PCP: IPA: 80/20: OTHER:  PRIMARY: BCBS Policy#: [id number] Subscriber: self CM Name: Delight Stare Phone#: [phone] Fax#: [phone] Pre-Cert#: [id number], authorized for 4/19-07/23/15 with clinical updates due 07/23/15 Employer:  Benefits: Phone #: [phone] Name: [employer] Eff. Date: 03/25/15 Deduct: $500 Out of Pocket Max: $800 Life Max: n/a CIR: 70%/30% SNF: 70%/30% Outpatient: $30 copay; 30 visits max, 19 remaining  Home Health: 70% Co-Pay: 30% DME: 70% Co-Pay: 30% Providers: In network SECONDARY: Policy#: Subscriber:  CM Name: Phone#: Fax#:  Pre-Cert#: Employer:  Benefits: Phone #: Name:  Eff. 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 T3 tumor resected 01/09/14 and subsequent chemo/XRT. She has been undergoing therapy with ability to transfer with SB/min assist and attempts at standing--06/24/15. She has had 3 week history of increase in spasticity and increase in BLE edema. She was admitted on 07/05/15 with B/B incontinence and decline in motor function. MRI done showing chronic T3 pathologic fracture with increased bulky epidural tumor with severe cord compression, new abnormal marrow signal T12- L3 with new osseous metastatic disease and similar findings S1. She was taken to OR emergently for T3 laminectomy for subtotal tumor resection by Dr. Christella Noa and treated with multiple units PRBC for ABLA.  currently on decadron taper, Xarelto to be resumed today and has plans for chemo in 2-3 weeks per input from Hem[facility]Onc at [facility]. Therapy ongoing and patient has had increase in spasticity, worsening of BLE weakenss with decline in functional mobility as well as ability to complete ADL tasks. CIR recommended for follow up therapy.       Past Medical History  Past Medical History  Diagnosis Date  . Hypertension   . DDD (degenerative disc disease), cervical   . DJD (degenerative joint disease)   . Obesity   . Chronic back pain   . Cancer [facility])     FNA of thyroid positive for onconytic/hurthle cell carcinoma  . History of rectal fissure   . Rotator cuff tendonitis right    Family History  family history includes Diabetes in her mother; Hypertension in her father and mother; Stroke in her father.  Prior Rehab/Hospitalizations:  Has the patient had major surgery during 100 days prior to admission?  No  Current Medications   Current facility-administered medications:  . 0.9 % sodium chloride infusion, 250 mL, Intravenous, Continuous, Ashok Pall, MD, 250 mL at 07/06/15 0255 .  0.9 % sodium chloride infusion, , Intravenous, Continuous, Chesley Mires, MD, Last Rate: 100 mL/hr at 07/06/15 0255 . acetaminophen (TYLENOL) tablet 650 mg, 650 mg, Oral, Q4H PRN **OR** acetaminophen (TYLENOL) suppository 650 mg, 650 mg, Rectal, Q4H PRN, Ashok Pall, MD . baclofen (LIORESAL) tablet 20 mg, 20 mg, Oral, 5 X Daily, Earnie Larsson, MD, 20 mg at 07/11/15 1047 . bisacodyl (DULCOLAX) EC tablet 5 mg, 5 mg, Oral, Daily PRN, Ashok Pall, MD, 5 mg at 07/10/15 1659 . dexamethasone (DECADRON) injection 4 mg, 4 mg, Intravenous, Q12H **OR** dexamethasone (DECADRON) tablet 4 mg, 4 mg, Oral, Q12H, Ashok Pall, MD, 4 mg at 07/11/15 1046 . diazepam (VALIUM) tablet 5 mg, 5 mg, Oral, Q6H PRN, Rahul P Desai, PA-C, 5 mg at 07/09/15 2105 . docusate sodium (COLACE) capsule 100 mg, 100 mg, Oral, BID, Ashok Pall, MD, 100 mg at 07/11/15 1047 . insulin aspart (novoLOG) injection 0-9 Units, 0-9 Units, Subcutaneous, 6 times per day, Javier Glazier, MD, 1 Units at 07/10/15 2010 . levothyroxine (SYNTHROID, LEVOTHROID) tablet 200 mcg, 200 mcg, Oral, QAC breakfast, Earnie Larsson, MD, 200 mcg at 07/11/15 0757 . magnesium citrate solution 1 Bottle, 1 Bottle, Oral, Once PRN, Ashok Pall, MD . menthol-cetylpyridinium (CEPACOL) lozenge 3 mg, 1 lozenge, Oral, PRN **OR** phenol (CHLORASEPTIC) mouth spray 1 spray, 1 spray, Mouth/Throat, PRN, Ashok Pall, MD . morphine 2 MG/ML injection 1-4 mg, 1-4 mg, Intravenous, Q3H PRN, Rahul P Desai, PA-C, 2 mg at 07/07/15 1023 . ondansetron (ZOFRAN) injection 4 mg, 4 mg, Intravenous, Q4H PRN, Ashok Pall, MD, 4 mg at 07/09/15 0643 . oxyCODONE-acetaminophen (PERCOCET/ROXICET) 5-325 MG per tablet 1-2 tablet, 1-2 tablet, Oral, Q4H PRN, Rahul P Desai, PA-C, 1 tablet at 07/11/15 0411 .  pantoprazole (PROTONIX) EC tablet 40 mg, 40 mg, Oral, Daily, Ashok Pall, MD, 40 mg at 07/11/15 1047 . rivaroxaban (XARELTO) tablet 20 mg, 20 mg, Oral, Q supper, Ashok Pall, MD, 20 mg at 07/10/15 1659 . senna-docusate (Senokot-S) tablet 1 tablet, 1 tablet, Oral, QHS PRN, Ashok Pall, MD . sodium chloride flush (NS) 0.9 % injection 3 mL, 3 mL, Intravenous, Q12H, Ashok Pall, MD, 3 mL at 07/10/15 2157 . sodium chloride flush (NS) 0.9 % injection 3 mL, 3 mL, Intravenous, PRN, Ashok Pall, MD . zinc oxide (BALMEX) 43.1 % cream 1 application, 1 application, Topical, Daily PRN, Ashok Pall, MD  Patients Current Diet: DIET DYS 3 Room service appropriate?: Yes; Fluid consistency:: Thin  Precautions / Restrictions Precautions Precautions: Back, Fall Precaution Booklet Issued: No Precaution Comments: Reviewed and performed log roll technique for supine<>sit Restrictions Weight Bearing Restrictions: No   Has the patient had 2 or more falls or a fall with injury in the past year?No  Prior Activity Level Limited Community (1-2x/wk): Pt. reports she goes out of her home twice per week for trips to [place] and other errands. Pt. says she has an older modified van for her outings in the wheelchair. She does not drive . Family is able to drive her Lucianne Lei.   Home Assistive Devices / Equipment Home Equipment: Bedside commode, Wheelchair - manual, Hospital bed  Prior Device Use: Indicate devices/aids used by the patient prior to current illness, exacerbation or injury? Manual wheelchair  Prior Functional Level Prior Function Level of Independence: Needs assistance Gait / Transfers Assistance Needed: pt was slide board transfering and scooting to drop arm with MinG prior to decline.  ADL's / Homemaking Assistance Needed: Performed bed level baths and dressed herself. Family performed homemaking tasks.   Self  Care: Did the patient need help bathing, dressing, using the toilet or  eating? Independent  Indoor Mobility: Did the patient need assistance with walking from room to room (with or without device)? Mobilizes with wheelchair only  Stairs: Did the patient need assistance with internal or external stairs (with or without device)? N/a, ramped entrance  Functional Cognition: Did the patient need help planning regular tasks such as shopping or remembering to take medications? Independent  Current Functional Level Cognition  Overall Cognitive Status: Within Functional Limits for tasks assessed Orientation Level: Oriented X4   Extremity Assessment (includes Sensation/Coordination)  Upper Extremity Assessment: Overall WFL for tasks assessed  Lower Extremity Assessment: Defer to PT evaluation RLE Deficits / Details: Chronic increased spasticity, impaired soft touch, deep pressure, and pain sensation. Strength grossly 2-/5 RLE Sensation: decreased light touch, decreased proprioception RLE Coordination: decreased fine motor, decreased gross motor LLE Deficits / Details: Chronic increased spasticity, impaired soft touch, deep pressure, and pain sensation. Strength grossly 2-/5. LLE Sensation: decreased light touch, decreased proprioception LLE Coordination: decreased fine motor, decreased gross motor    ADLs  Overall ADL's : Needs assistance/impaired Eating/Feeding: Independent Grooming: Wash/dry hands, Wash/dry face, Oral care, Applying deodorant, Brushing hair, Set up, Bed level Upper Body Bathing: Set up Lower Body Bathing: Maximal assistance, Bed level Upper Body Dressing : Bed level, Moderate assistance Lower Body Dressing: Total assistance, Bed level Toilet Transfer: Total assistance Toilet Transfer Details (indicate cue type and reason): unable to safely attempt  Toileting- Clothing Manipulation and Hygiene: Total assistance, Bed level Functional mobility during ADLs: Maximal assistance, +2 for physical assistance (bed mobility ) General ADL  Comments: Session focused on bed mobility for hygiene after toileting. Radiology came to take pt for scan during session. Pt able to roll to side with max A to assist with BLE. Max A to clean pt. Pt states PTA, she completed bathing/dressing on BSC and was continenet of bowel/bladder. Pt states she feels she has more spasticity in BLE. Unable to actively dorsiflex B feet. Educated pt/family on importance of proper positioning and preventing foot drop. Will need PRAFO boots at this time.    Mobility  Overal bed mobility: Needs Assistance Bed Mobility: Rolling, Sidelying to Sit, Sit to Sidelying Rolling: Min assist Sidelying to sit: +2 for physical assistance, Max assist Sit to sidelying: Max assist, +2 for physical assistance General bed mobility comments: Cues for log rolling technique. Pt able to flex Rt knee for ease of rolling to the Lt. Max assist to support trunk and manage Bil LEs w/ supine<>sit. Blocked Bil knees during sidelying>sit to prevent knee extension Bil.    Transfers  Overall transfer level: Needs assistance Equipment used: (lift equipment) Transfer via Lift Equipment: (Bariatric stedy) Transfers: Sit to/from Stand Sit to Stand: From elevated surface, Max assist, +2 physical assistance General transfer comment: Assist to boost up to 60% standing using bed pad and pt using strength in Bil LEs. Rt LE flexes w/ sit>stand, Lt LE WBing but little to no involuntary contraction.     Ambulation / Gait / Stairs / Office manager / Balance Dynamic Sitting Balance Sitting balance - Comments: Fluctuated between min guard assist>min assist. Kyphotic and required cues for upright posture. Pt anxious about sliding OOB. Balance Overall balance assessment: Needs assistance Sitting-balance support: Bilateral upper extremity supported, Feet supported Sitting balance-Leahy Scale: Poor Sitting balance - Comments: Fluctuated between min guard assist>min  assist. Kyphotic and required cues for upright posture. Pt anxious  about sliding OOB. Postural control: Posterior lean Standing balance support: Bilateral upper extremity supported, During functional activity Standing balance-Leahy Scale: Zero    Special needs/care consideration BiPAP/CPAP no CPM no Continuous Drip IV no Dialysis no Days no Life Vest no Oxygen no Special Bed no Trach Size no Wound Vac (area) no  Skin thoracic back incision with honeycomb dressing per nursing assessment  Bowel mgmt:last BM 07/10/15, incontinent Bladder mgmt: foley catheter Diabetic mgmt no     Previous Home Environment Living Arrangements: Other relatives (sister and brother in [employer] [place]) Available Help at Discharge: Family, Available 24 hours/day Type of Home: House Home Layout: One level Home Access: Ramped entrance Bathroom Shower/Tub: Tub/shower unit Bathroom Accessibility: No Additional Comments: Pt lives with sister   Discharge Living Setting Plans for Discharge Living Setting: Patient's home Type of Home at Discharge: House Discharge Home Layout: One level Discharge Home Access: Ramped entrance Discharge Bathroom Shower/Tub: Tub/shower unit Discharge Bathroom Accessibility: No Does the patient have any problems obtaining your medications?: No  Social/Family/Support Systems Patient Roles: Other (Comment) (sister, daughter) Anticipated Caregiver: Sarah [name], sister and Sarah [name], mother Anticipated Caregiver's Contact Information: Sarah [name] [phone]; Sarah [name] [phone] Ability/Limitations of Caregiver: Sarah [name] works part time Caregiver Availability: 24/7   Goals/Additional Needs Patient/Family Goal for Rehab: min assist for PT and OT at wheelchair level; mod I SLP Expected length of stay: 18-24 days Cultural Considerations: n/a Dietary Needs: dysphagia 3 diet with thin liquids, ground meats, carb  modified Equipment Needs: TBA Pt/Family Agrees to Admission and willing to participate: Yes Program Orientation Provided & Reviewed with Pt/Caregiver Including Roles & Responsibilities: Yes   Decrease burden of Care through IP rehab admission: n/a   Possible need for SNF placement upon discharge:not anticipated   Patient Condition: This patient's medical and functional status has changed since the consult dated: 07/06/15 in which the Rehabilitation Physician determined and documented that the patient's condition is appropriate for intensive rehabilitative care in an inpatient rehabilitation facility. See "History of Present Illness" (above) for medical update. Functional changes are: Pt. At +2 max assist for bed mobility, dysphagia 3 diet at 90 degrees upright. Patient's medical and functional status update has been discussed with the Rehabilitation physician and patient remains appropriate for inpatient rehabilitation. Will admit to inpatient rehab today.  Preadmission Screen Completed By: Sarah [name], 07/11/2015 11:32 AM ______________________________________________________________________  Discussed status with Dr. Posey Pronto on 07/11/15 at 1131 and received telephone approval for admission today.  Admission Coordinator: Sarah [name], time 1131/Date 07/11/15          Cosigned by: Ankit Lorie Phenix, MD at 07/11/2015 12:13 PM  Revision History     Date/Time User Provider Type Action   07/11/2015 12:13 PM Ankit Lorie Phenix, MD Physician Cosign   07/11/2015 11:32 AM Sarah [name] Rehab Admission Coordinator Sign

## 2015-07-11 NOTE — Progress Notes (Signed)
Nurse and patient review the rehab booklet, safety plan and rehab process on admission .

## 2015-07-11 NOTE — Interval H&P Note (Signed)
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  The patient's history has been reviewed, patient examined, and there is no change in status.  Patient continues to be appropriate for intensive inpatient rehabilitation.  I have reviewed the patient's chart and labs.  Questions were answered to the patient's satisfaction. The PAPE has been reviewed and assessment remains appropriate.  Sarah [name] Lorie Phenix 07/11/2015, 4:58 PM

## 2015-07-11 NOTE — Care Management Note (Signed)
Case Management Note  Patient Details  Name: Sarah [name] MRN: [id number] Date of Birth: 09/22/1965  Subjective/Objective:                    Action/Plan: Patient discharging to CIR today. No further needs per CM.   Expected Discharge Date:                  Expected Discharge Plan:  [place]  In-House Referral:     Discharge planning Services     Post Acute Care Choice:    Choice offered to:     DME Arranged:    DME Agency:     HH Arranged:    [place] Agency:     Status of Service:  Completed, signed off  Medicare Important Message Given:    Date Medicare IM Given:    Medicare IM give by:    Date Additional Medicare IM Given:    Additional Medicare Important Message give by:     If discussed at [place] of Stay Meetings, dates discussed:    Additional Comments:  Pollie Friar, RN 07/11/2015, 10:13 AM

## 2015-07-12 ENCOUNTER — Inpatient Hospital Stay (HOSPITAL_COMMUNITY): Payer: BLUE CROSS/BLUE SHIELD | Admitting: Occupational Therapy

## 2015-07-12 ENCOUNTER — Inpatient Hospital Stay (HOSPITAL_COMMUNITY): Payer: BLUE CROSS/BLUE SHIELD | Admitting: Speech Pathology

## 2015-07-12 ENCOUNTER — Ambulatory Visit (HOSPITAL_COMMUNITY): Admission: RE | Admit: 2015-07-12 | Payer: BLUE CROSS/BLUE SHIELD | Source: Ambulatory Visit

## 2015-07-12 ENCOUNTER — Inpatient Hospital Stay (HOSPITAL_COMMUNITY): Payer: BLUE CROSS/BLUE SHIELD | Admitting: *Deleted

## 2015-07-12 ENCOUNTER — Inpatient Hospital Stay (HOSPITAL_COMMUNITY): Payer: BLUE CROSS/BLUE SHIELD

## 2015-07-12 DIAGNOSIS — M4714 Other spondylosis with myelopathy, thoracic region: Secondary | ICD-10-CM

## 2015-07-12 DIAGNOSIS — M6281 Muscle weakness (generalized): Secondary | ICD-10-CM | POA: Diagnosis not present

## 2015-07-12 LAB — COMPREHENSIVE METABOLIC PANEL
ALK PHOS: 90 U/L (ref 38–126)
ALT: 12 U/L — ABNORMAL LOW (ref 14–54)
ANION GAP: 11 (ref 5–15)
AST: 11 U/L — ABNORMAL LOW (ref 15–41)
Albumin: 2.6 g/dL — ABNORMAL LOW (ref 3.5–5.0)
BUN: 17 mg/dL (ref 6–20)
CALCIUM: 7.9 mg/dL — AB (ref 8.9–10.3)
CHLORIDE: 100 mmol/L — AB (ref 101–111)
CO2: 26 mmol/L (ref 22–32)
Creatinine, Ser: 0.91 mg/dL (ref 0.44–1.00)
Glucose, Bld: 121 mg/dL — ABNORMAL HIGH (ref 65–99)
Potassium: 4.6 mmol/L (ref 3.5–5.1)
SODIUM: 137 mmol/L (ref 135–145)
Total Bilirubin: 0.8 mg/dL (ref 0.3–1.2)
Total Protein: 5.6 g/dL — ABNORMAL LOW (ref 6.5–8.1)

## 2015-07-12 LAB — CBC WITH DIFFERENTIAL/PLATELET
Basophils Absolute: 0 10*3/uL (ref 0.0–0.1)
Basophils Relative: 0 %
EOS ABS: 0 10*3/uL (ref 0.0–0.7)
EOS PCT: 0 %
HCT: 38.6 % (ref 36.0–46.0)
Hemoglobin: 12.7 g/dL (ref 12.0–15.0)
LYMPHS ABS: 1.3 10*3/uL (ref 0.7–4.0)
LYMPHS PCT: 13 %
MCH: 28.2 pg (ref 26.0–34.0)
MCHC: 32.9 g/dL (ref 30.0–36.0)
MCV: 85.6 fL (ref 78.0–100.0)
MONOS PCT: 6 %
Monocytes Absolute: 0.6 10*3/uL (ref 0.1–1.0)
Neutro Abs: 7.9 10*3/uL — ABNORMAL HIGH (ref 1.7–7.7)
Neutrophils Relative %: 81 %
PLATELETS: 234 10*3/uL (ref 150–400)
RBC: 4.51 MIL/uL (ref 3.87–5.11)
RDW: 13 % (ref 11.5–15.5)
WBC: 9.8 10*3/uL (ref 4.0–10.5)

## 2015-07-12 LAB — GLUCOSE, CAPILLARY: GLUCOSE-CAPILLARY: 152 mg/dL — AB (ref 65–99)

## 2015-07-12 MED ORDER — ONDANSETRON HCL 4 MG PO TABS
4.0000 mg | ORAL_TABLET | Freq: Four times a day (QID) | ORAL | Status: DC | PRN
  Administered 2015-07-12 – 2015-07-31 (×5): 4 mg via ORAL
  Filled 2015-07-12 (×5): qty 1

## 2015-07-12 MED ORDER — TIZANIDINE HCL 2 MG PO TABS
2.0000 mg | ORAL_TABLET | Freq: Two times a day (BID) | ORAL | Status: DC
  Administered 2015-07-12 – 2015-07-17 (×9): 2 mg via ORAL
  Filled 2015-07-12 (×12): qty 1

## 2015-07-12 MED ORDER — FLEET ENEMA 7-19 GM/118ML RE ENEM
1.0000 | ENEMA | Freq: Once | RECTAL | Status: AC
  Administered 2015-07-12: 1 via RECTAL
  Filled 2015-07-12: qty 1

## 2015-07-12 MED ORDER — BISACODYL 10 MG RE SUPP: 10.0000 mg | Freq: Every day | RECTAL | Status: DC

## 2015-07-12 NOTE — Evaluation (Signed)
Occupational Therapy Assessment and Plan  Patient Details  Name: LAKEISA HENINGER MRN: [id number] Date of Birth: 1966-01-22  OT Diagnosis: abnormal posture, lumbago (low back pain), muscle weakness (generalized) and paraparesis at level T-3 Rehab Potential: Rehab Potential (ACUTE ONLY): Good ELOS: 3 weeks   Today's Date: 07/12/2015 OT Individual Time: 3559-7416 OT Individual Time Calculation (min): 71 min     Problem List:  Patient Active Problem List   Diagnosis Date Noted  . 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 Neurogenic bladder   . History of pulmonary embolism   . Post-operative pain   . Metastatic cancer ([place])   . Steroid-induced hyperglycemia   . Benign essential HTN   . Acute blood loss anemia   . Leukocytosis   . Depression   . Obstipation   . Thoracic spine tumor 07/06/2015  . 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 Cord compression ([place])   . Encounter for intubation   . Lower extremity weakness   . Respiratory failure ([place])   . Postoperative hypothyroidism   . Peripheral edema 06/07/2015  . Hypothyroidism, postop 03/08/2015  . 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 Dysuria 09/04/2014  . Hypertension 05/01/2014  . Ingrown right big toenail 05/01/2014  . Hypokalemia 05/01/2014  . GERD (gastroesophageal reflux disease) 02/14/2014  . Dysphagia, pharyngoesophageal phase 02/06/2014  . Type 2 diabetes mellitus without complication ([place]) 38/45/3646  . Constipation   . Neurogenic bowel   . 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 ([place]) 01/20/2014  . Metastatic cancer to spine ([place]) 01/19/2014  . Rotator cuff tendonitis   . Acute postoperative respiratory failure ([place]) 01/09/2014  . 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 Leg weakness, bilateral 12/13/2013    Past Medical History:  Past Medical History   Diagnosis Date  . Hypertension   . DDD (degenerative disc disease), cervical   . DJD (degenerative joint disease)   . Obesity   . Chronic back pain   . Cancer [facility])     FNA of thyroid positive for onconytic/hurthle cell carcinoma  . History of rectal fissure   . Rotator cuff tendonitis right  . Pulmonary embolus, right ([place]) 2015  . HIT (heparin-induced thrombocytopenia) [facility])    Past Surgical History:  Past Surgical History  Procedure Laterality Date  . Tonsillectomy    . Laminectomy N/A 12/14/2013    Procedure: THORACIC LAMINECTOMY FOR TUMOR THORACIC THREE;  Surgeon: Ashok Pall, MD;  Location: [place] NEURO ORS;  Service: Neurosurgery;  Laterality: N/A;  THORACIC LAMINECTOMY FOR TUMOR THORACIC THREE  . Posterior lumbar fusion 4 level N/A 12/30/2013    Procedure: Thoracic one-Thoracic five posterior thoracic fusion with pedicle screws;  Surgeon: Ashok Pall, MD;  Location: [place] NEURO ORS;  Service: Neurosurgery;  Laterality: N/A;  Thoracic one-Thoracic five posterior thoracic fusion with pedicle screws  . Thyroidectomy N/A 01/09/2014    Procedure: TOTAL THYROIDECTOMY;  Surgeon: Izora Gala, MD;  Location: [place];  Service: ENT;  Laterality: N/A;  . Laminectomy N/A 07/05/2015    Procedure: THORACIC LAMINECTOMY FOR TUMOR;  Surgeon: Ashok Pall, MD;  Location: [place] NEURO ORS;  Service: Neurosurgery;  Laterality: N/A;    Assessment & Plan Clinical Impression: MELAINIE KRINSKY is a 50 y.o. 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 T3 tumor resected 01/09/14 and subsequent chemo/XRT. She has been undergoing therapy with ability to transfer with SB/min assist and attempts at standing--06/24/15. She has had 3 week history of increase in spasticity and increase in BLE edema. She was admitted on 07/05/15 with B/B incontinence and decline in motor function. MRI done showing  chronic T3 pathologic fracture with increased bulky epidural tumor with severe cord compression, new abnormal marrow signal T12- L3 with new osseous metastatic disease and similar findings S1. She was taken to OR emergently for T3 laminectomy for subtotal tumor resection by Dr. Christella Noa and treated with multiple units PRBC for ABLA.  currently on decadron taper, Xarelto to be resumed today and has plans for chemo in 2-3 weeks per input from Hem/Onc at [facility]. Therapy ongoing and patient has had increase in spasticity, worsening of BLE weakenss with decline in functional mobility as well as ability to complete ADL tasks. CIR recommended for follow up therapy. Patient transferred to CIR on 07/11/2015 .    Patient currently requires total with basic self-care skills secondary to muscle paralysis, decreased cardiorespiratoy endurance, abnormal tone and unbalanced muscle activation and decreased sitting balance, decreased postural control, decreased balance strategies and difficulty maintaining precautions.  Prior to hospitalization, patient could complete ADLs with min.  Patient will benefit from skilled intervention to decrease level of assist with basic self-care skills and increase independence with basic self-care skills prior to discharge home with care partner.  Anticipate patient will require minimal physical assistance and follow up home health.  OT - End of Session Activity Tolerance: Tolerates 10 - 20 min activity with multiple rests Endurance Deficit: Yes Endurance Deficit Description: Pt needed rest following brief period of sitting OT Assessment Rehab Potential (ACUTE ONLY): Good OT Patient demonstrates impairments in the following area(s): Balance;Endurance;Motor;Perception;Safety;Sensory;Pain OT Basic ADL's Functional Problem(s): Bathing;Dressing;Toileting OT Transfers Functional Problem(s): Toilet OT Additional Impairment(s): None OT Plan OT Intensity: Minimum of 1-2 x/day, 45 to 90  minutes OT Frequency: 5 out of 7 days OT Duration/Estimated Length of Stay: 3 weeks OT Treatment/Interventions: Balance/vestibular training;Discharge planning;Community reintegration;DME/adaptive equipment instruction;Functional mobility training;Neuromuscular re-education;Pain management;Psychosocial support;Patient/family education;Self Care/advanced ADL retraining;Therapeutic Activities;UE/LE Strength taining/ROM;Therapeutic Exercise;UE/LE Coordination activities OT Self Feeding Anticipated Outcome(s): Independent OT Basic Self-Care Anticipated Outcome(s): Set-up  OT Toileting Anticipated Outcome(s): Mod  A OT Bathroom Transfers Anticipated Outcome(s): Min A OT Recommendation Patient destination: Home Follow Up Recommendations: Home health OT Equipment Recommended: To be determined   Skilled Therapeutic Intervention Pt seen for OT eval and ADL bathing/dressing session. Pt in supine upon arrival with family members present. She completed bathing/dressing from supine level, requiring increased assist for bed mobility and to access LB for bathing task. Pt with incontinent BM, required +2 total A for hygiene and to don clean brief. Grooming completed with set-up sitting up in bed.  Pt left in supine at end of session, all needs in reach and caregivers present.  Educated throughout session regarding role of OT, OT goals, CIR, bowel/ bladder program, decreased sensation, and d/c planning.   OT Evaluation Precautions/Restrictions  Precautions Precautions: Back;Fall Precaution Booklet Issued: No Precaution Comments: Reviewed and performed log roll technique for supine<>sit Restrictions Weight Bearing Restrictions: No General Chart Reviewed: Yes Pain   4/10 pain in back. Pt pre-medicated prior to tx session.  Home Living/Prior Functioning Home Living Available Help at Discharge: Family, Available 24 hours/day Type of Home: House Home Access: Ramped entrance Home Layout: One  level Bathroom Shower/Tub: Chiropodist: Standard Bathroom  Accessibility: No Additional Comments: Bathroom not accessible from w/c level  Lives With: Family Prior Function Level of Independence: Needs assistance with homemaking, Needs assistance with ADLs  Able to Take Stairs?: No Driving: No Vision/Perception  Vision- History Baseline Vision/History: Wears glasses Wears Glasses: Reading only Patient Visual Report: No change from baseline Vision- Assessment Vision Assessment?: No apparent visual deficits  Cognition Overall Cognitive Status: Within Functional Limits for tasks assessed Arousal/Alertness: Awake/alert Orientation Level: Person;Place;Situation Person: Oriented Place: Oriented Situation: Oriented Year: 2017 Month: April Day of Week: Correct Memory: Appears intact Immediate Memory Recall: Sock;Blue;Bed Memory Recall: Sock;Blue;Bed Memory Recall Sock: Without Cue Memory Recall Blue: Without Cue Memory Recall Bed: Without Cue Awareness: Appears intact Problem Solving: Appears intact Safety/Judgment: Appears intact Sensation Sensation Light Touch: Impaired Detail Light Touch Impaired Details: Impaired RLE;Impaired LLE Proprioception: Impaired by gross assessment Additional Comments: Pt reports increased paresthesias over last month, up to waist.  Coordination Gross Motor Movements are Fluid and Coordinated: No Fine Motor Movements are Fluid and Coordinated: Yes Coordination and Movement Description: Incomplete paraperesis Heel Shin Test: Unable to move against gravity Motor  Motor Motor: Paraplegia;Abnormal postural alignment and control;Abnormal tone Motor - Skilled Clinical Observations: Pt with increased tone R>L, but flexion loosens up with passive movement.  Mobility  Bed Mobility Bed Mobility: Rolling Right;Rolling Left Rolling Right: 3: Mod assist Rolling Left: 2: Max assist Supine to Sit: 2: Max assist Sit to Supine: 1: +2  Total assist Sit to Supine: Patient Percentage: 20% Sit to Supine - Details: Verbal cues for sequencing;Verbal cues for precautions/safety;Verbal cues for gait pattern;Verbal cues for safe use of DME/AE;Manual facilitation for weight shifting;Manual facilitation for placement;Manual facilitation for weight bearing Transfers Stand to Sit: 1: +2 Total assist  Trunk/Postural Assessment  Cervical Assessment Cervical Assessment: Exceptions to [facility] Thoracic Assessment Thoracic Assessment: Exceptions to [facility] (Kyphotic) Lumbar Assessment Lumbar Assessment: Exceptions to [facility] (Posterior pelvic tilt) Postural Control Postural Control: Deficits on evaluation Trunk Control: Unable to achieve full upright posture in sitting, heavy UE reliance for trunk support Righting Reactions: Delayed and insufficient  Balance Balance Balance Assessed: No (OT eval completed from bed level)- See below details of PT eval Static Sitting Balance Static Sitting - Balance Support: Right upper extremity supported;Left upper extremity supported;Feet supported Static Sitting - Level of Assistance: 4: Min assist Static Sitting - Comment/# of Minutes: 19mn Dynamic Sitting Balance Dynamic Sitting - Balance Support: Right upper extremity supported;Left upper extremity supported;Feet supported Dynamic Sitting - Level of Assistance: 3: Mod assist Dynamic Sitting Balance - Compensations: UE use Dynamic Sitting - Balance Activities: Lateral lean/weight shifting;Forward lean/weight shifting Extremity/Trunk Assessment RUE Assessment RUE Assessment: Within Functional Limits RUE Strength RUE Overall Strength Comments: generalized weakness 3/5 grossly LUE Assessment LUE Assessment: Within Functional Limits LUE Strength LUE Overall Strength Comments: generalized weakness 3/5 grossly   See Function Navigator for Current Functional Status.   Refer to Care Plan for Long Term Goals  Recommendations for other services:  None  Discharge Criteria: Patient will be discharged from OT if patient refuses treatment 3 consecutive times without medical reason, if treatment goals not met, if there is a change in medical status, if patient makes no progress towards goals or if patient is discharged from hospital.  The above assessment, treatment plan, treatment alternatives and goals were discussed and mutually agreed upon: by patient and by family  Lewis, Amy C 07/12/2015, 1:28 PM

## 2015-07-12 NOTE — Evaluation (Signed)
Speech Language Pathology Assessment and Plan  Patient Details  Name: Sarah [name] MRN: [id number] Date of Birth: 09/19/1965     Today's Date: 07/12/2015 SLP Individual Time: 0800-0830  SLP Individual Time: 1779-3903  SLP Individual Time Calculation (min): 55 min   Problem List:  Patient Active Problem List   Diagnosis Date Noted  . 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 Neurogenic bladder   . History of pulmonary embolism   . Post-operative pain   . Metastatic cancer ([place])   . Steroid-induced hyperglycemia   . Benign essential HTN   . Acute blood loss anemia   . Leukocytosis   . Depression   . Obstipation   . Thoracic spine tumor 07/06/2015  . 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 Cord compression ([place])   . Encounter for intubation   . Lower extremity weakness   . Respiratory failure ([place])   . Postoperative hypothyroidism   . Peripheral edema 06/07/2015  . Hypothyroidism, postop 03/08/2015  . 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 Dysuria 09/04/2014  . Hypertension 05/01/2014  . Ingrown right big toenail 05/01/2014  . Hypokalemia 05/01/2014  . GERD (gastroesophageal reflux disease) 02/14/2014  . Dysphagia, pharyngoesophageal phase 02/06/2014  . Type 2 diabetes mellitus without complication ([place]) 00/92/3300  . Constipation   . Neurogenic bowel   . 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 ([place]) 01/20/2014  . Metastatic cancer to spine ([place]) 01/19/2014  . Rotator cuff tendonitis   . Acute postoperative respiratory failure ([place]) 01/09/2014  . 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 Leg weakness, bilateral 12/13/2013   Past Medical History:  Past Medical History  Diagnosis Date  . Hypertension   . DDD (degenerative disc disease), cervical   . DJD (degenerative joint disease)   . Obesity   .  Chronic back pain   . Cancer [facility])     FNA of thyroid positive for onconytic/hurthle cell carcinoma  . History of rectal fissure   . Rotator cuff tendonitis right  . Pulmonary embolus, right ([place]) 2015  . HIT (heparin-induced thrombocytopenia) [facility])    Past Surgical History:  Past Surgical History  Procedure Laterality Date  . Tonsillectomy    . Laminectomy N/A 12/14/2013    Procedure: THORACIC LAMINECTOMY FOR TUMOR THORACIC THREE;  Surgeon: Ashok Pall, MD;  Location: [place] NEURO ORS;  Service: Neurosurgery;  Laterality: N/A;  THORACIC LAMINECTOMY FOR TUMOR THORACIC THREE  . Posterior lumbar fusion 4 level N/A 12/30/2013    Procedure: Thoracic one-Thoracic five posterior thoracic fusion with pedicle screws;  Surgeon: Ashok Pall, MD;  Location: [place] NEURO ORS;  Service: Neurosurgery;  Laterality: N/A;  Thoracic one-Thoracic five posterior thoracic fusion with pedicle screws  . Thyroidectomy N/A 01/09/2014    Procedure: TOTAL THYROIDECTOMY;  Surgeon: Izora Gala, MD;  Location: [place];  Service: ENT;  Laterality: N/A;  . Laminectomy N/A 07/05/2015    Procedure: THORACIC LAMINECTOMY FOR TUMOR;  Surgeon: Ashok Pall, MD;  Location: [place] NEURO ORS;  Service: Neurosurgery;  Laterality: N/A;    Assessment / Plan / Recommendation Clinical Impression  Pt participated with clinical swallow assessment and pt/family education. Pt is currently on a dys 3 diet with thin liquids which appears to be the most appropriate diet for pt given baseline mild dysphagia secondary to H&N ca with subsequent radiation. Pt independently demonstrates thorough mastication, small bites/sips  and effortful swallow with solids. No further therapy planned at this time. Informally, speech/language appear WNL. Pt and family verbalized agreement with POC.  Skilled Therapeutic Interventions          Education provided to patient and her family to reinforce education provided on the acute care floor. Discussed swallow precautions as well  as patient driven pharyngeal strengthening exercises to maximize ROM and function of pharyngeal processes despite radiation therapy. Also discussed making physician aware of any decline in swallow function noted at any point moving forward. Pt and family verbalized and demonstrated comprehension of education.  SLP Assessment  Patient does not need any further Speech Lanaguage Pathology Services    Recommendations  SLP Diet Recommendations: Dysphagia 3 (Mech soft);Thin Liquid Administration via: Cup;Straw Medication Administration: Whole meds with liquid Supervision: Patient able to self feed Compensations: Slow rate;Small sips/bites Postural Changes and/or Swallow Maneuvers: Seated upright 90 degrees;Upright 30-60 min after meal (OOB for meals if possible) Oral Care Recommendations: Oral care BID Patient destination: Home Follow up Recommendations: None Equipment Recommended: None recommended by SLP    SLP Frequency     SLP Duration  SLP Intensity  SLP Treatment/Interventions            Pain Pain Assessment Pain Assessment: No/denies pain (discomfort due to constipation, no pain)  Prior Functioning Cognitive/Linguistic Baseline: Within functional limits Type of Home: House  Lives With: Family Available Help at Discharge: Family;Available 24 hours/day  Function:  Eating Eating   Modified Consistency Diet: Yes Eating Assist Level: Swallowing techniques: self managed           Cognition Comprehension Comprehension assist level: Follows complex conversation/direction with no assist  Expression   Expression assist level: Expresses complex ideas: With no assist  Social Interaction Social Interaction assist level: Interacts appropriately with others with medication or extra time (anti-anxiety, antidepressant).  Problem Solving Problem solving assist level: Solves complex problems: Recognizes & self-corrects  Memory Memory assist level: Complete Independence: No helper    Short Term Goals: n/a  Refer to Care Plan for Long Term Goals  Recommendations for other services: None  Discharge Criteria: Patient will be discharged from SLP if patient refuses treatment 3 consecutive times without medical reason, if treatment goals not met, if there is a change in medical status, if patient makes no progress towards goals or if patient is discharged from hospital.  The above assessment, treatment plan, treatment alternatives and goals were discussed and mutually agreed upon: by patient and by family  Vinetta Bergamo MA, CCC-SLP 07/12/2015, 4:18 PM

## 2015-07-12 NOTE — Progress Notes (Signed)
Patient received dulcolax suppository during day shift. Per family report, last bowel movement was 7 days ago.  Patient still complaining of constipation this shift. Charge rn performed dissempaction and reported that she still felt  some hard stools left. Fleets enema prn given by charge rn while patient laid on her left side with very minimal result. Mild bleeding from hemorrhoids noted. Marlowe Shores, PA was notified of patient's constipation and ordered SMOG. Procedure was explained to the patient and she acknowledged understanding. SMOG performed by charge Stanford Scotland, E. with assist from patient's rn. Patient had a large bowel movement post SMOG and tolerated the enema well. Will monitor.

## 2015-07-12 NOTE — Evaluation (Signed)
Physical Therapy Assessment and Plan  Patient Details  Name: Sarah [name] MRN: [id number] Date of Birth: [birth year]  PT Diagnosis: Abnormal posture, Hypertonia, Impaired sensation, Muscle weakness and Paralysis Rehab Potential: Fair ELOS: 3 weeks   Today's Date: 07/12/2015 PT Individual Time: 0900-1000 PT Individual Time Calculation (min): 60 min    Problem List:  Patient Active Problem List   Diagnosis Date Noted  . 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 Neurogenic bladder   . History of pulmonary embolism   . Post-operative pain   . Metastatic cancer ([place])   . Steroid-induced hyperglycemia   . Benign essential HTN   . Acute blood loss anemia   . Leukocytosis   . Depression   . Obstipation   . Thoracic spine tumor 07/06/2015  . 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 Cord compression ([place])   . Encounter for intubation   . Lower extremity weakness   . Respiratory failure ([place])   . Postoperative hypothyroidism   . Peripheral edema 06/07/2015  . Hypothyroidism, postop 03/08/2015  . 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 Dysuria 09/04/2014  . Hypertension 05/01/2014  . Ingrown right big toenail 05/01/2014  . Hypokalemia 05/01/2014  . GERD (gastroesophageal reflux disease) 02/14/2014  . Dysphagia, pharyngoesophageal phase 02/06/2014  . Type 2 diabetes mellitus without complication ([place]) 69/48/5462  . Constipation   . Neurogenic bowel   . 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 ([place]) 01/20/2014  . Metastatic cancer to spine ([place]) 01/19/2014  . Rotator cuff tendonitis   . Acute postoperative respiratory failure ([place]) 01/09/2014  . 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 Leg weakness, bilateral 12/13/2013    Past Medical History:  Past Medical History  Diagnosis Date  . Hypertension   . DDD (degenerative  disc disease), cervical   . DJD (degenerative joint disease)   . Obesity   . Chronic back pain   . Cancer [facility])     FNA of thyroid positive for onconytic/hurthle cell carcinoma  . History of rectal fissure   . Rotator cuff tendonitis right  . Pulmonary embolus, right ([place]) 2015  . HIT (heparin-induced thrombocytopenia) [facility])    Past Surgical History:  Past Surgical History  Procedure Laterality Date  . Tonsillectomy    . Laminectomy N/A 12/14/2013    Procedure: THORACIC LAMINECTOMY FOR TUMOR THORACIC THREE;  Surgeon: Ashok Pall, MD;  Location: [place] [facility]URO ORS;  Service: Neurosurgery;  Laterality: N/A;  THORACIC LAMINECTOMY FOR TUMOR THORACIC THREE  . Posterior lumbar fusion 4 level N/A 12/30/2013    Procedure: Thoracic one-Thoracic five posterior thoracic fusion with pedicle screws;  Surgeon: Ashok Pall, MD;  Location: [place] NEURO ORS;  Service: Neurosurgery;  Laterality: N/A;  Thoracic one-Thoracic five posterior thoracic fusion with pedicle screws  . Thyroidectomy N/A 01/09/2014    Procedure: TOTAL THYROIDECTOMY;  Surgeon: Izora Gala, MD;  Location: [place];  Service: ENT;  Laterality: N/A;  . Laminectomy N/A 07/05/2015    Procedure: THORACIC LAMINECTOMY FOR TUMOR;  Surgeon: Ashok Pall, MD;  Location: [place] NEURO ORS;  Service: Neurosurgery;  Laterality: N/A;    Assessment & Plan Clinical Impression:Sarah [name] is a 50 y.o. 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 T3 tumor resected 01/09/14 and subsequent chemo/XRT.  She has been undergoing therapy with ability to transfer with SB/min assist and attempts at standing--06/24/15. She has had 3 week history of increase in spasticity and increase in BLE edema. She was admitted on 07/05/15 with B/B incontinence and decline in motor function. MRI done showing chronic T3 pathologic fracture with increased bulky  epidural tumor with severe cord compression, new abnormal marrow signal T12- L3 with new osseous metastatic disease and similar findings S1. She was taken to OR emergently for T3 laminectomy for subtotal tumor resection by Dr. Christella Noa and treated with multiple units PRBC for ABLA.  currently on decadron taper, Xarelto to be resumed today and has plans for chemo in 2-3 weeks per input from Hem/Onc at [facility]. Therapy ongoing and patient has had increase in spasticity, worsening of BLE weakenss with decline in functional mobility as well as ability to complete ADL tasks. CIR recommended for follow up therapy.   Patient currently requires total with mobility secondary to muscle weakness and muscle paralysis, decreased cardiorespiratoy endurance, abnormal tone and decreased coordination and decreased sitting balance, decreased standing balance, decreased postural control and decreased balance strategies.  Prior to hospitalization, patient was modified independent  with mobility and lived with Family in a House home.  Home access is  Ramped entrance.  Patient will benefit from skilled PT intervention to maximize safe functional mobility, minimize fall risk and decrease caregiver burden for planned discharge home with 24 hour assist.  Anticipate patient will benefit from follow up [facility] at discharge.  PT - End of Session Activity Tolerance: Tolerates < 10 min activity, no significant change in vital signs Endurance Deficit: Yes Endurance Deficit Description: Pt needed rest following brief period of sitting PT Assessment Rehab Potential (ACUTE/IP ONLY): Fair Barriers to Discharge: Decreased caregiver support (Sister works) PT Patient demonstrates impairments in the following area(s): Balance;Endurance;Motor;Safety;Sensory;Skin Integrity PT Transfers Functional Problem(s): Bed Mobility;Bed to Chair;Car PT Locomotion Functional Problem(s): Wheelchair Mobility PT Plan PT Intensity: Minimum of 1-2 x/day ,45 to  90 minutes PT Frequency: 5 out of 7 days PT Duration Estimated Length of Stay: 3 weeks PT Treatment/Interventions: Balance/vestibular training;Community reintegration;Discharge planning;Disease management/prevention;DME/adaptive equipment instruction;Functional mobility training;Neuromuscular re-education;Pain management;Patient/family education;Psychosocial support;Skin care/wound management;Therapeutic Exercise;Splinting/orthotics;Therapeutic Activities;UE/LE Strength taining/ROM;UE/LE Coordination activities;Wheelchair propulsion/positioning PT Transfers Anticipated Outcome(s): Min A sliding board PT Recommendation Follow Up Recommendations: Home health PT;24 hour supervision/assistance Patient destination: Home Equipment Recommended: To be determined Equipment Details: Family bringing her electric WC for assessment  Skilled Therapeutic Intervention Pt tx initiated upon eval. Pt oriented to unit, call bell, PT POC, and principles of rehab and recovery. Eval was limited due to fatigue and results of enema, but pt agreeable to attempts for mobility. Pt was educated on fall risk reduction and back precautions. Pt instructed in rolling, bed mobility, sitting balance, and transfer training for sit<>stands at [facility] with +2 assist and max cues for technique. Pt was returned to bed after 5 attempts at standing. Proper WC was obtained and family will be bringing her home WC likely tomorrow. Pt instructed in supine therex including ankle pumps, quad sets, glute sets, all x10 with 5 sec holds as well as AAROM at hips and knees. Pt left up in bed with all needs in reach. See eval for ful report.     PT Evaluation Precautions/Restrictions Precautions Precautions: Back;Fall Precaution Booklet Issued: No Precaution Comments: Reviewed and performed log roll technique for supine<>sit Restrictions Weight Bearing Restrictions: No General   Vital Signs  Pain   Home Living/Prior Functioning Home  Living Available  Help at Discharge: Family;Available 24 hours/day Type of Home: House Home Access: Ramped entrance Home Layout: One level Bathroom Shower/Tub: Chiropodist: Standard Bathroom Accessibility: No Additional Comments: Pt lives with sister   Lives With: Family Prior Function Level of Independence: Independent with transfers (Set-up assist from sister for ADLs, washign back and feet to)  Able to Take Stairs?: No Driving: No Vision/Perception  Vision - History Baseline Vision: Wears glasses only for reading Patient Visual Report: No change from baseline  Cognition Overall Cognitive Status: Within Functional Limits for tasks assessed Orientation Level: Oriented X4 Sensation Sensation Light Touch: Impaired Detail Light Touch Impaired Details: Impaired RLE;Impaired LLE Proprioception: Impaired by gross assessment Additional Comments: Pt reports increased paresthesias over last month, up to waist.  Coordination Gross Motor Movements are Fluid and Coordinated: No Fine Motor Movements are Fluid and Coordinated: No Coordination and Movement Description: Pt with decreased excursion, accuracy, and ROM throughout bil LEs L>R. Pt has increased extensor tone and spasms with voluntary movement.  Heel Shin Test: Unable to move against gravity Motor  Motor Motor: Paraplegia;Abnormal postural alignment and control;Abnormal tone Motor - Skilled Clinical Observations: Pt with increased tone R>L, but flexion loosens up with passive movement.   Mobility Bed Mobility Bed Mobility: Rolling Right;Rolling Left Rolling Right: 3: Mod assist Rolling Left: 2: Max assist Supine to Sit: 2: Max assist Sit to Supine: 1: +2 Total assist Sit to Supine: Patient Percentage: 20% Sit to Supine - Details: Verbal cues for sequencing;Verbal cues for precautions/safety;Verbal cues for gait pattern;Verbal cues for safe use of DME/AE;Manual facilitation for weight shifting;Manual  facilitation for placement;Manual facilitation for weight bearing Transfers Transfers: Yes Stand to Sit: 1: +2 Total assist Lateral/Scoot Transfer Details (indicate cue type and reason): Attempted sit<>stand transfer with Stedy, but pt unable to come to full stand after 5 attempts.  Locomotion  Ambulation Ambulation: No Stairs / Additional Locomotion Stairs: No Wheelchair Mobility Wheelchair Mobility: No  Trunk/Postural Assessment  Cervical Assessment Cervical Assessment: Exceptions to [facility] (Forward head posture) Thoracic Assessment Thoracic Assessment: Exceptions to [facility] (Surgical incision, kyphotic) Lumbar Assessment Lumbar Assessment: Exceptions to [facility] (hypomobile, posterior pelvic tilt) Postural Control Postural Control: Deficits on evaluation Trunk Control: Unable to achieve full upright posture in sitting, heavy UE reliance for trunk support Righting Reactions: Delayed and insufficient  Balance Balance Balance Assessed: Yes Static Sitting Balance Static Sitting - Balance Support: Right upper extremity supported;Left upper extremity supported;Feet supported Static Sitting - Level of Assistance: 4: Min assist Static Sitting - Comment/# of Minutes: 60mn Dynamic Sitting Balance Dynamic Sitting - Balance Support: Right upper extremity supported;Left upper extremity supported;Feet supported Dynamic Sitting - Level of Assistance: 3: Mod assist Dynamic Sitting Balance - Compensations: UE use Dynamic Sitting - Balance Activities: Lateral lean/weight shifting;Forward lean/weight shifting Extremity Assessment  RUE Assessment RUE Assessment: Exceptions to [facility] Strength RUE Overall Strength Comments: generalized weakness 3/5 grossly LUE Assessment LUE Assessment: Exceptions to [facility] Strength LUE Overall Strength Comments: generalized weakness 3/5 grossly RLE Assessment RLE Assessment: Exceptions to [facility](~2-/5 throughout with increased ext and adductor tone noted,) LLE  Assessment LLE Assessment:  (1+/5 throughout overall, increased tone )   See Function Navigator for Current Functional Status.   Refer to Care Plan for Long Term Goals  Recommendations for other services: None  Discharge Criteria: Patient will be discharged from PT if patient refuses treatment 3 consecutive times without medical reason, if treatment goals not met, if there is a change in medical status, if patient  makes no progress towards goals or if patient is discharged from hospital.  The above assessment, treatment plan, treatment alternatives and goals were discussed and mutually agreed upon: by patient  Kennieth Rad, PT, DPT  Kennieth Rad M 07/12/2015, 12:44 PM

## 2015-07-12 NOTE — Progress Notes (Signed)
[place] [place] Individual Statement of Services  Patient Name:  Sarah [name]  Date:  07/12/2015  Welcome to the [facility].  Our goal is to provide you with an individualized program based on your diagnosis and situation, designed to meet your specific needs.  With this comprehensive rehabilitation program, you will be expected to participate in at least 3 hours of rehabilitation therapies Monday-Friday, with modified therapy programming on the weekends.  Your rehabilitation program will include the following services:  Physical Therapy (PT), Occupational Therapy (OT), Speech Therapy (ST), 24 hour per day rehabilitation nursing, Neuropsychology, Case Management (Social Worker), Rehabilitation Medicine, Nutrition Services and Pharmacy Services  Weekly team conferences will be held on Tuesdays to discuss your progress.  Your Social Worker will talk with you frequently to get your input and to update you on team discussions.  Team conferences with you and your family in attendance may also be held.  Expected length of stay: about 3 weeks  Overall anticipated outcome: Minimal Assistance (possibly some moderate assistance goals)  Depending on your progress and recovery, your program may change. Your Social Worker will coordinate services and will keep you informed of any changes. Your Social Worker's name and contact numbers are listed  below.  The following services may also be recommended but are not provided by the [facility] will be made to provide these services after discharge if needed.  Arrangements include referral to agencies that provide these services.  Your insurance has been verified to be:  [employer] Your primary doctor is:  Dr. Georges Lynch  Pertinent information will be shared with your doctor and  your insurance company.  Social Worker:  Alfonse Alpers, LCSW  [phone] or ([phone]  Information discussed with and copy given to patient by: Trey Sailors, 07/12/2015, 1:48 PM

## 2015-07-12 NOTE — Progress Notes (Signed)
Late entry: progress notes 1000 07/12/15 nursing Patient refusing to take off foley cath; Pam PA made aware new orders noted.

## 2015-07-12 NOTE — Progress Notes (Signed)
Physical Therapy Session Note  Patient Details  Name: Sarah [name] MRN: [id number] Date of Birth: Mar 27, 1965  Today's Date: 07/12/2015 PT Individual Time: 1300-1400 PT Individual Time Calculation (min): 60 min   Short Term Goals: Week 1:  PT Short Term Goal 1 (Week 1): Pt will roll R/L with mod A and safe techniwue without cues PT Short Term Goal 2 (Week 1): Pt will performe supine<>sit with Max A of 1 PT Short Term Goal 3 (Week 1): Pt will perform sliding board transfer with +2 assist  PT Short Term Goal 4 (Week 1): Pt will propel WC x25' with S  Skilled Therapeutic Interventions/Progress Updates:  Pt noted with incontinent and loose BM in brief. Total assist for hygiene and max assist for rolling several repetitions for hygiene, new brief, and donning pants. Required +2 assist and cues for technique for supine to sit with log roll technique. Min assist sitting balance EOB with pt feeling a tendency for posterior lean but none noted. +2 assist for slideboard transfer to w/c with cues for technique and head/hips relationship. Educated on stretching at bed level for BLE for spasticity and tone management as well with pt stating she had been doing some of this at home at times. She also reports family plans to bring in power w/c from home. W/c mobility with supervision with cues for technique especially for turning. End of session left up in w/c with family present and all needs in reach.    Therapy Documentation Precautions:  Precautions Precautions: Back, Fall Precaution Booklet Issued: No Precaution Comments: Reviewed and performed log roll technique for supine<>sit Restrictions Weight Bearing Restrictions: No  Pain: Reports generalized pain in back - premedicated.    See Function Navigator for Current Functional Status.   Therapy/Group: Individual Therapy  [name], PT, DPT  07/12/2015, 3:34 PM

## 2015-07-12 NOTE — Progress Notes (Signed)
[place] PHYSICAL MEDICINE & REHABILITATION     PROGRESS NOTE    Subjective/Complaints: Had a long night with loose stool after enema. Didn't sleep much. Having back pain. Not much appetite.   ROS: Pt denies fever, rash/itching, headache, blurred or double vision, nausea, vomiting, abdominal pain,   chest pain, shortness of breath, palpitations, dysuria, dizziness,   bleeding, anxiety, or depression   Objective: Vital Signs: Blood pressure 126/77, pulse 79, temperature 98.4 F (36.9 C), temperature source Oral, resp. rate 18, height '5\' 11"'$  (1.803 m), weight 116.756 kg (257 lb 6.4 oz), last menstrual period 06/06/2015, SpO2 100 %. No results found.  Recent Labs  07/12/15 0558  WBC 9.8  HGB 12.7  HCT 38.6  PLT 234    Recent Labs  07/12/15 0558  NA 137  K 4.6  CL 100*  GLUCOSE 121*  BUN 17  CREATININE 0.91  CALCIUM 7.9*   CBG (last 3)   Recent Labs  07/11/15 0350 07/11/15 0737 07/11/15 1112  GLUCAP 114* 113* 99    Wt Readings from Last 3 Encounters:  07/11/15 116.756 kg (257 lb 6.4 oz)  07/06/15 117.3 kg (258 lb 9.6 oz)  04/20/14 127.007 kg (280 lb)    Physical Exam:  Constitutional: She is oriented to person, place, and time. She appears well-developed and well-nourished.  Obese female.  HENT:  Head: Normocephalic and atraumatic.  Mouth/Throat: Oropharynx is clear and moist.  Eyes: Conjunctivae and EOM are normal. Pupils are equal, round, and reactive to light.  Neck: Normal range of motion. Neck supple.  Cardiovascular: Normal rate and regular rhythm.  No murmur heard. Respiratory: Effort normal. No stridor. She has decreased breath sounds in the right lower field and the left lower field. She has no wheezes. She has no rales.  GI: Soft. Bowel sounds are normal. She exhibits no distension. There is no tenderness. There is no guarding.  Genitourinary:      Foley in place.  Musculoskeletal: She exhibits no edema or tenderness.  Neurological:  She is alert and oriented to person, place, and time. She displays abnormal reflex. She exhibits abnormal muscle tone. Coordination abnormal.  Flat but speech clear and able to follow commands without difficulty.  BUE 5/5 proximal to distal.  RLE: hip flexion, knee extension 2+/5, ankle dorsi/plantar flexion 3+/5 LLE: 2-/5 proximal to distal  Extensor tone.--3-4/5  Tight heel cords bilaterally.  DTR 3+ b/l LE  Skin: Skin is warm and dry.  Upper thoracic incision with dry scabs under honeycomb dressing.  Psychiatric: Her speech is normal and behavior is normal. Judgment and thought content normal. Her affect is blunt. Cognition and memory are normal.   Assessment/Plan: 1. 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 Physiatrist is providing close team supervision and 24 hour management of active medical problems listed below. Physiatrist and rehab team continue to assess barriers to discharge/monitor patient progress toward functional and medical goals.  Function:  Bathing Bathing position      Bathing parts      Bathing assist        Upper Body Dressing/Undressing Upper body dressing                    Upper body assist        Lower Body Dressing/Undressing Lower body dressing  Lower body assist        Toileting Toileting          Toileting assist     Transfers Chair/bed Clinical biochemist          Cognition Comprehension    Expression    Social Interaction    Problem Solving    Memory      Medical Problem List and Plan: 1. 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 H/o PE/Anticoagulation: Pharmaceutical: Xarelto 3.  Pain Management: Controlled currently on oxycodone prn.  -treat as needed 4. Mood: LCSW to follow for evaluation and support. Ego support provided. She is aware of hard work ahead of her but has good social supports at home.  5. Neuropsych: This patient is capable of making decisions on her own behalf. 6. Skin/Wound Care: Routine pressure relief measures. Continue to monitor thoracic incision for healing as well as skin due to bowel incontinence.  7. Fluids/Electrolytes/Nutrition: Monitor I/O. Appetite has been good.   8.Metastatic cancer with T3 pathologic FX with cord compression and new osseous mets T12- L3/S1:   -follow up with endo/onc about treatment plan/schedule 9. Steroid induced Hyperglycemia: Blood sugars improving with decadron taper. Will monitor BID for now.  10. HTN: Continue to monitor BP bid--off prinivil at this time as blood pressures controlled. May need to be resumed as activity level improves  11. Spasticity: Continue baclofen 20 mg 5 X day with valium prn.   -add zanaflex '2mg'$  bid 12. ABLA: Will monitor for now. Check CBC in am.  13. Leucocytosis: Likely steroid induced.   UA suspicious, await ucx 14. Hypothyroid s/p thyroidectomy for hurtle cell thyroid cancer:  67. H/o depression: Resume home dose Zoloft. 15. Neurogenic bowel with Obstipation: Started bowel program-- Started Senna 2 bid ---change to daily   -sse effective.   -daily bowel program 16. Neurogenic bladder: Was able to void at home. Remove foley  start bladder training.      LOS (Days) 1 A FACE TO FACE EVALUATION WAS PERFORMED  Sarah [name] T 07/12/2015 10:34 AM

## 2015-07-12 NOTE — Progress Notes (Signed)
Patient incontinent of watery stool x1  this morning. Claims she did not sleep well last night despite Valium given.  Refused foley catheter to be discontinued at this time. She wants to wait a little later. Will re check when she is awake.

## 2015-07-13 ENCOUNTER — Inpatient Hospital Stay (HOSPITAL_COMMUNITY): Payer: BLUE CROSS/BLUE SHIELD | Admitting: Occupational Therapy

## 2015-07-13 ENCOUNTER — Inpatient Hospital Stay (HOSPITAL_COMMUNITY): Payer: BLUE CROSS/BLUE SHIELD

## 2015-07-13 LAB — GLUCOSE, CAPILLARY: Glucose-Capillary: 115 mg/dL — ABNORMAL HIGH (ref 65–99)

## 2015-07-13 MED ORDER — SENNA 8.6 MG PO TABS
2.0000 | ORAL_TABLET | Freq: Every day | ORAL | Status: DC
  Administered 2015-07-14 – 2015-07-15 (×2): 17.2 mg via ORAL
  Administered 2015-07-18 – 2015-07-19 (×2): 8.6 mg via ORAL
  Administered 2015-07-20: 17.2 mg via ORAL
  Administered 2015-07-21 – 2015-07-24 (×4): 8.6 mg via ORAL
  Administered 2015-07-25 – 2015-07-26 (×2): 17.2 mg via ORAL
  Administered 2015-07-27: 8.6 mg via ORAL
  Administered 2015-07-28 – 2015-07-29 (×2): 17.2 mg via ORAL
  Administered 2015-07-30 – 2015-08-01 (×3): 8.6 mg via ORAL
  Filled 2015-07-13 (×20): qty 2

## 2015-07-13 MED ORDER — NITROFURANTOIN MONOHYD MACRO 100 MG PO CAPS
100.0000 mg | ORAL_CAPSULE | Freq: Two times a day (BID) | ORAL | Status: AC
  Administered 2015-07-13 – 2015-07-19 (×14): 100 mg via ORAL
  Filled 2015-07-13 (×18): qty 1

## 2015-07-13 MED ORDER — BISACODYL 10 MG RE SUPP
10.0000 mg | Freq: Every day | RECTAL | Status: DC
  Administered 2015-07-13 – 2015-07-31 (×19): 10 mg via RECTAL
  Filled 2015-07-13 (×19): qty 1

## 2015-07-13 NOTE — Progress Notes (Signed)
Occupational Therapy Session Note  Patient Details  Name: Sarah [name] MRN: [id number] Date of Birth: [birth year]  Today's Date: 07/13/2015 OT Individual Time: 0700-0800 OT Individual Time Calculation (min): 60 min    Short Term Goals: Week 1:  OT Short Term Goal 1 (Week 1): Pt will complete sliding board transfer to [facility] with max A OT Short Term Goal 2 (Week 1): Pt will dress UB with set-up.  OT Short Term Goal 3 (Week 1): Pt will don pants witth mod A in supine  Skilled Therapeutic Interventions/Progress Updates:    Pt seen for OT ADL bathing/dressing session. Pt in supine upon arrival, agreeable to tx session. She completed LB bathing/dressing in supine with +2 assist, requiring min A to roll to L and max-+2 for rolling to L . Pt with increased tone in B LEs, limiting LE movement/ mobility. She transferred to EOB with +2 assist with VCs for log rolling technique and assist for LE management. She sat on EOB the complete UB bathing/dressing with close supervision and occasional min- mod A for dynamic sitting balance.  She declined practice transfer to [facility] due to fatigue, opting to do that at next OT session. She transferred EOB> w/c via sliding board with +2 assist.  Grooming task completed from w/c level at sink with set-up. Pt able to use B UEs to wash hair (with shampoo shower cap), blow dry, and comb hair for UE strengthening and activity tolerance. Pt left sitting in w/c at end of session, set-up with meal tray and all needs in reach.   Therapy Documentation Precautions:  Precautions Precautions: Back, Fall Precaution Booklet Issued: No Precaution Comments: Reviewed and performed log roll technique for supine<>sit Restrictions Weight Bearing Restrictions: No Pain:   No/ denies pain  See Function Navigator for Current Functional Status.   Therapy/Group: Individual Therapy  Lewis, Haden Cavenaugh C 07/13/2015, 6:31 AM

## 2015-07-13 NOTE — Progress Notes (Signed)
Occupational Therapy Session Note  Patient Details  Name: Sarah [name] MRN: [id number] Date of Birth: 10-08-1965  Today's Date: 07/13/2015 OT Co-Treatment Time: 1100-1130 ( co-treatment with Barrie Folk, PT from 1100-1200) OT Co-Treatment Time Calculation (min): 30 min   Short Term Goals: Week 1:  OT Short Term Goal 1 (Week 1): Pt will complete sliding board transfer to [facility] with max A OT Short Term Goal 2 (Week 1): Pt will dress UB with set-up.  OT Short Term Goal 3 (Week 1): Pt will don pants witth mod A in supine  Skilled Therapeutic Interventions/Progress Updates:    Pt seen for skilled co-treatment with PT and focus on functional transfers, energy conservation, B UE strength, and endurance. Pt performed slide board transfer with +2 assist from wheelchair <> drop arm commode chair. Pt needing verbal cues for head and hip relationship. OT in front of pt during transfer providing assistance with all foot placement as well. Pt then propelled to gym for car transfer secondary to energy conservation. Please see PT note for details. Pt propelling wheelchair with B UEs to day room on carpeted surface with supervision and verbal cues for technique. Pt returning to room and given paper handout for energy conservation with therapist educating pt on how to utilize techniques for self care, community mobility, and also general principles of topic. Education to continue. Call bell and all needed items within reach upon exiting the room.   Therapy Documentation Precautions:  Precautions Precautions: Back, Fall Precaution Booklet Issued: No Precaution Comments: Reviewed and performed log roll technique for supine<>sit Restrictions Weight Bearing Restrictions: No Vital Signs: Therapy Vitals Temp: 97.7 F (36.5 C) Temp Source: Oral Pulse Rate: 63 Resp: 18 BP: 112/60 mmHg Patient Position (if appropriate): Sitting Oxygen Therapy SpO2: 100 % O2 Device: Not Delivered  See Function Navigator  for Current Functional Status.   Therapy/Group: Individual Therapy  Phineas Semen 07/13/2015, 2:08 PM

## 2015-07-13 NOTE — IPOC Note (Signed)
Overall Plan of Care [facility]) Patient Details Name: Sarah [name] MRN: [id number] DOB: [birth year]  Admitting Diagnosis: T3 Fx  tumor resection  Hospital Problems: Active Problems:   Leg weakness, bilateral   Spastic paraparesis (HCC)   Neurogenic bladder   History of pulmonary embolism   Post-operative pain   Metastatic cancer (HCC)   Steroid-induced hyperglycemia   Benign essential HTN   Acute blood loss anemia   Leukocytosis   Depression   Obstipation   Thoracic myelopathy     Functional Problem List: Nursing Bladder, Bowel, Edema, Nutrition, Pain, Safety, Skin Integrity, Medication Management  PT Balance, Endurance, Motor, Safety, Sensory, Skin Integrity  OT Balance, Endurance, Motor, Perception, Safety, Sensory, Pain  SLP Nutrition  TR         Basic ADL's: OT Bathing, Dressing, Toileting     Advanced  ADL's: OT       Transfers: PT Bed Mobility, Bed to Chair, Car  OT Toilet     Locomotion: PT Wheelchair Mobility     Additional Impairments: OT None  SLP Swallowing      TR      Anticipated Outcomes Item Anticipated Outcome  Self Feeding Independent  Swallowing      Basic self-care  Set-up   Toileting  Mod  A   Bathroom Transfers Min A  Bowel/Bladder  mod assist.  Transfers  Min A sliding board  Locomotion     Communication     Cognition     Pain  less<2  Safety/Judgment  mod assist.   Therapy Plan: PT Intensity: Minimum of 1-2 x/day ,45 to 90 minutes PT Frequency: 5 out of 7 days PT Duration Estimated Length of Stay: 3 weeks OT Intensity: Minimum of 1-2 x/day, 45 to 90 minutes OT Frequency: 5 out of 7 days OT Duration/Estimated Length of Stay: 3 weeks         Team Interventions: Nursing Interventions Patient/Family Education, Bladder Management, Bowel Management, Pain Management, Medication Management, Skin Care/Wound Management, Dysphagia/Aspiration Precaution Training, Discharge Planning  PT interventions Balance/vestibular  training, Community reintegration, Discharge planning, Disease management/prevention, DME/adaptive equipment instruction, Functional mobility training, Neuromuscular re-education, Pain management, Patient/family education, Psychosocial support, Skin care/wound management, Therapeutic Exercise, Splinting/orthotics, Therapeutic Activities, UE/LE Strength taining/ROM, UE/LE Coordination activities, Wheelchair propulsion/positioning  OT Interventions Training and development officer, Discharge planning, Community reintegration, Engineer, drilling, Functional mobility training, Neuromuscular re-education, Pain management, Psychosocial support, Patient/family education, Self Care/advanced ADL retraining, Therapeutic Activities, UE/LE Strength taining/ROM, Therapeutic Exercise, UE/LE Coordination activities  SLP Interventions    TR Interventions    SW/CM Interventions Discharge Planning, Psychosocial Support, Patient/Family Education    Team Discharge Planning: Destination: PT-Home ,OT- Home , SLP-Home Projected Follow-up: PT-Home health PT, 24 hour supervision/assistance, OT-  Home health OT, SLP-None Projected Equipment Needs: PT-To be determined, OT- To be determined, SLP-None recommended by SLP Equipment Details: PT-Family bringing her electric WC for assessment, OT-  Patient/family involved in discharge planning: PT- Patient,  OT-Patient, SLP-Patient, Family member/caregiver  MD ELOS: 3 weeks Medical Rehab Prognosis:  Excellent Assessment: The patient has been admitted for CIR therapies with the diagnosis of 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 The team will be addressing functional mobility, strength, stamina, balance, safety, adaptive techniques and equipment, self-care, bowel and bladder mgt, patient and caregiver education, NMR, skin care, pain mgt, ego support, orthotics, spasticity mgt. Goals have been set at min to mod assist with w/c level mobility and  self-care/ADL's.    [name], MD, Mellody Drown  See Team Conference Notes for weekly updates to the plan of care

## 2015-07-13 NOTE — Progress Notes (Signed)
[place] PHYSICAL MEDICINE & REHABILITATION     PROGRESS NOTE    Subjective/Complaints: Had a long night with loose stool after enema. Didn't sleep much. Having back pain. Not much appetite.   ROS: Pt denies fever, rash/itching, headache, blurred or double vision, nausea, vomiting, abdominal pain,   chest pain, shortness of breath, palpitations, dysuria, dizziness,   bleeding, anxiety, or depression   Objective: Vital Signs: Blood pressure 124/70, pulse 64, temperature 97.9 F (36.6 C), temperature source Oral, resp. rate 18, height '5\' 11"'$  (1.803 m), weight 116.756 kg (257 lb 6.4 oz), last menstrual period 06/06/2015, SpO2 99 %. No results found.  Recent Labs  07/12/15 0558  WBC 9.8  HGB 12.7  HCT 38.6  PLT 234    Recent Labs  07/12/15 0558  NA 137  K 4.6  CL 100*  GLUCOSE 121*  BUN 17  CREATININE 0.91  CALCIUM 7.9*   CBG (last 3)   Recent Labs  07/11/15 1112 07/12/15 1915 07/13/15 0637  GLUCAP 99 152* 115*    Wt Readings from Last 3 Encounters:  07/11/15 116.756 kg (257 lb 6.4 oz)  07/06/15 117.3 kg (258 lb 9.6 oz)  04/20/14 127.007 kg (280 lb)    Physical Exam:  Constitutional: She is oriented to person, place, and time. She appears well-developed and well-nourished.  Obese female.  HENT:  Head: Normocephalic and atraumatic.  Mouth/Throat: Oropharynx is clear and moist.  Eyes: Conjunctivae and EOM are normal. Pupils are equal, round, and reactive to light.  Neck: Normal range of motion. Neck supple.  Cardiovascular: Normal rate and regular rhythm.  No murmur heard. Respiratory: Effort normal. No stridor. She has decreased breath sounds in the right lower field and the left lower field. She has no wheezes. She has no rales.  GI: Soft. Bowel sounds are normal. She exhibits no distension. There is no tenderness. There is no guarding.  Genitourinary:      Foley in place.  Musculoskeletal: She exhibits no edema or tenderness.  Neurological:  She is alert and oriented to person, place, and time. She displays abnormal reflex. She exhibits abnormal muscle tone. Coordination abnormal.  Flat but speech clear and able to follow commands without difficulty.  BUE 5/5 proximal to distal.  RLE: hip flexion, knee extension 2+/5, ankle dorsi/plantar flexion 3+/5 LLE: 2-/5 proximal to distal  Extensor tone.--3-4/5  Tight heel cords bilaterally.  DTR 3+ b/l LE  Skin: Skin is warm and dry.  Upper thoracic incision with dry scabs under honeycomb dressing.  Psychiatric: Her speech is normal and behavior is normal. Judgment and thought content normal. Her affect is blunt. Cognition and memory are normal.   Assessment/Plan: 1. 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 Physiatrist is providing close team supervision and 24 hour management of active medical problems listed below. Physiatrist and rehab team continue to assess barriers to discharge/monitor patient progress toward functional and medical goals.  Function:  Bathing Bathing position   Position:  (LB in bed, UB seated EOB)  Bathing parts Body parts bathed by patient: Right arm, Left arm, Chest, Abdomen, Right upper leg, Left upper leg Body parts bathed by helper: Right lower leg, Left lower leg, Back  Bathing assist Assist Level: 2 helpers      Upper Body Dressing/Undressing Upper body dressing   What is the patient wearing?: Pull over shirt/dress     Pull over shirt/dress -  Perfomed by patient: Thread/unthread right sleeve, Thread/unthread left sleeve, Put head through opening Pull over shirt/dress - Perfomed by helper: Pull shirt over trunk        Upper body assist        Lower Body Dressing/Undressing Lower body dressing   What is the patient wearing?: Pants, Shoes       Pants- Performed by helper: Thread/unthread right pants leg,  Thread/unthread left pants leg, Pull pants up/down   Non-skid slipper socks- Performed by helper: Don/doff right sock, Don/doff left sock       Shoes - Performed by helper: Don/doff right shoe, Don/doff left shoe, Fasten right, Fasten left          Lower body assist Assist for lower body dressing: 2 Helpers      Toileting Toileting     Toileting steps completed by helper: Adjust clothing prior to toileting, Performs perineal hygiene, Adjust clothing after toileting (bed level)    Toileting assist Assist level: Two helpers   Transfers Chair/bed transfer Chair/bed transfer activity did not occur: Safety/medical concerns (Pt unable to successfuly complete attempt) Chair/bed transfer method: Lateral scoot Chair/bed transfer assist level: 2 helpers Chair/bed transfer assistive device: Sliding board, Armrests, Bedrails     Locomotion Ambulation Ambulation activity did not occur: N/A (WC level)         Wheelchair Wheelchair activity did not occur: Safety/medical concerns Type: Motorized Max wheelchair distance: 50 Assist Level: Supervision or verbal cues  Cognition Comprehension Comprehension assist level: Follows complex conversation/direction with no assist  Expression Expression assist level: Expresses complex ideas: With no assist  Social Interaction Social Interaction assist level: Interacts appropriately with others with medication or extra time (anti-anxiety, antidepressant).  Problem Solving Problem solving assist level: Solves complex problems: Recognizes & self-corrects  Memory Memory assist level: Complete Independence: No helper    Medical Problem List and Plan: 1. 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 H/o PE/Anticoagulation: Pharmaceutical: Xarelto 3. Pain Management: Controlled currently on oxycodone prn.  -treat as  needed 4. Mood: LCSW to follow for evaluation and support. Ego support provided. She is aware of hard work ahead of her but has good social supports at home.  5. Neuropsych: This patient is capable of making decisions on her own behalf. 6. Skin/Wound Care: Routine pressure relief measures. Continue to monitor thoracic incision for healing as well as skin due to bowel incontinence.  7. Fluids/Electrolytes/Nutrition: Monitor I/O. Appetite has been good so far.   8.Metastatic cancer with T3 pathologic FX with cord compression and new osseous mets T12- L3/S1:   -follow up with endo/onc about treatment plan/schedule 9. Steroid induced Hyperglycemia: Blood sugars improving with decadron taper. Will monitor BID for now.  10. HTN: Continue to monitor BP bid--off prinivil at this time as blood pressures controlled. May need to be resumed as activity level improves  11. Spasticity: Continue baclofen 20 mg 5 X day with valium prn.   -added zanaflex '2mg'$  bid 12. ABLA: hgb up to 12.7  13. Leucocytosis: Likely steroid induced.   Urine as below 14. 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 H/o depression: Resume home dose Zoloft. 15. Neurogenic bowel with Obstipation: changing to pm bowel program per pt preference  -enema prn 16. Neurogenic bladder: Was able to void at home.  -pt wishes to keep foley (refused removal)---will continue thru weekend with voiding trial to follow  -ua  suspicious  -will go ahead and start empiric abx (macrobid) pending cx/sens  LOS (Days) 2 A FACE TO FACE EVALUATION WAS PERFORMED  Cristino Degroff T 07/13/2015 9:36 AM

## 2015-07-13 NOTE — Progress Notes (Signed)
Physical Therapy Session Note  Patient Details  Name: Sarah [name] MRN: [id number] Date of Birth: January 22, 1966  Today's Date: 07/13/2015 PT Individual Time: 6387-5643 PT Individual Time Calculation (min): 60 min   Short Term Goals: Week 1:  PT Short Term Goal 1 (Week 1): Pt will roll R/L with mod A and safe techniwue without cues PT Short Term Goal 2 (Week 1): Pt will performe supine<>sit with Max A of 1 PT Short Term Goal 3 (Week 1): Pt will perform sliding board transfer with +2 assist  PT Short Term Goal 4 (Week 1): Pt will propel WC x25' with S  Skilled Therapeutic Interventions/Progress Updates:   Session focused on neuro re-ed for postural control, weightbearing through BLE, and endurance in standing frame, functional slideboard transfer back to bed, bed mobility, and w/c propulsion. Pt able to tolerate 3 min and 5 min in standing frame with cues for upright posture and practicing lifting up 1 UE support. PT changed out w/c for better legrest positioning and adjusted appropriately. Performed max assist +2 transfer uphill back into bed with +2 for scooting back onto the bed and max assist for return to supine. Increased tone and spasms noted in BLE. RN present in room.   Therapy Documentation Precautions:  Precautions Precautions: Back, Fall Precaution Booklet Issued: No Precaution Comments: Reviewed and performed log roll technique for supine<>sit Restrictions Weight Bearing Restrictions: No  Pain:  reports feeling queasy. RN aware.    See Function Navigator for Current Functional Status.   Therapy/Group: Individual Therapy  [name], PT, DPT  07/13/2015, 4:05 PM

## 2015-07-13 NOTE — Progress Notes (Signed)
Social Work Assessment and Plan  Patient Details  Name: Sarah [name] MRN: [id number] Date of Birth: Jun 15, 1965  Today's Date: 07/12/2015  Problem List:  Patient Active Problem List   Diagnosis Date Noted  . 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 Spastic paraparesis ([place])   . Neurogenic bladder   . History of pulmonary embolism   . Post-operative pain   . Metastatic cancer ([place])   . Steroid-induced hyperglycemia   . Benign essential HTN   . Acute blood loss anemia   . Leukocytosis   . Depression   . Obstipation   . Thoracic spine tumor 07/06/2015  . 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 ([place]) 07/06/2015  . Cord compression ([place])   . Encounter for intubation   . Lower extremity weakness   . Respiratory failure ([place])   . Postoperative hypothyroidism   . Peripheral edema 06/07/2015  . Hypothyroidism, postop 03/08/2015  . Secondary malignant neoplasm of vertebral column ([place]) 03/08/2015  . 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 Dysuria 09/04/2014  . Hypertension 05/01/2014  . Ingrown right big toenail 05/01/2014  . Hypokalemia 05/01/2014  . GERD (gastroesophageal reflux disease) 02/14/2014  . Dysphagia, pharyngoesophageal phase 02/06/2014  . Type 2 diabetes mellitus without complication ([place]) 78/24/2353  . Constipation   . Neurogenic bowel   . 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 ([place]) 01/20/2014  . Metastatic cancer to spine ([place]) 01/19/2014  . Rotator cuff tendonitis   . Acute postoperative respiratory failure ([place]) 01/09/2014  . 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 Hurthle cell carcinoma of thyroid ([place]) 12/29/2013  . Leg weakness, bilateral 12/13/2013   Past Medical History:  Past Medical History  Diagnosis Date  . Hypertension   . DDD (degenerative disc disease), cervical   . DJD (degenerative joint disease)   . Obesity   . Chronic back pain   . Cancer [facility])     FNA of thyroid positive for onconytic/hurthle cell carcinoma  . History of rectal fissure   .  Rotator cuff tendonitis right  . Pulmonary embolus, right ([place]) 2015  . 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 Tonsillectomy    . Laminectomy N/A 12/14/2013    Procedure: THORACIC LAMINECTOMY FOR TUMOR THORACIC THREE;  Surgeon: Ashok Pall, MD;  Location: [place] NEURO ORS;  Service: Neurosurgery;  Laterality: N/A;  THORACIC LAMINECTOMY FOR TUMOR THORACIC THREE  . Posterior lumbar fusion 4 level N/A 12/30/2013    Procedure: Thoracic one-Thoracic five posterior thoracic fusion with pedicle screws;  Surgeon: Ashok Pall, MD;  Location: [place] NEURO ORS;  Service: Neurosurgery;  Laterality: N/A;  Thoracic one-Thoracic five posterior thoracic fusion with pedicle screws  . Thyroidectomy N/A 01/09/2014    Procedure: TOTAL THYROIDECTOMY;  Surgeon: Izora Gala, MD;  Location: [place];  Service: ENT;  Laterality: N/A;  . Laminectomy N/A 07/05/2015    Procedure: THORACIC LAMINECTOMY FOR TUMOR;  Surgeon: Ashok Pall, MD;  Location: [place] NEURO ORS;  Service: Neurosurgery;  Laterality: N/A;   Social History:  reports that she has never smoked. She has never used smokeless tobacco. She reports that she does not drink alcohol or use illicit drugs.  Family / Support Systems Marital Status: Single Patient Roles: Other (Comment) (sister, dtr, aunt) Other Supports: [name] - sister - [phone];  Sierria Bruney - mother - [phone] Anticipated Caregiver: Magdalene River, sister and Ashea Winiarski, mother Ability/Limitations of Caregiver: [name] works part time Caregiver Availability: 24/7 Family  Dynamics: close, supportive family  Social History Preferred language: English Religion: Baptist Education: high school Read: Yes Write: Yes Employment Status: Disabled Date Retired/Disabled/Unemployed: January 2016 Legal History/Current Legal Issues: none reported Guardian/Conservator: N/A - MD has stated that pt is  capable of making her own decisions.   Abuse/Neglect Physical Abuse: Denies Verbal Abuse: Denies Sexual Abuse: Denies Exploitation of patient/patient's resources: Denies Self-Neglect: Denies  Emotional Status Pt's affect, behavior and adjustment status: Pt remains positive and strong despite her diagnosis and multiple medical complications.  She states that she "wills" herself out of feeling down.  She had just taken some steps as recently as a month ago, so she's a little discouraged about going backwards, but is motivated to work hard again.  She was still going to outpt therapy PTA. Recent Psychosocial Issues: none reported Psychiatric History: none reported Substance Abuse History: none reported  Patient / Family Perceptions, Expectations & Goals Pt/Family understanding of illness & functional limitations: Pt reports a good understanding of her new condition.  Questions have been answered. Premorbid pt/family roles/activities: Pt was managing from the w/c and was going to outpt therapy. Anticipated changes in roles/activities/participation: Pt hopes to get back to this. Pt/family expectations/goals: Pt wants to get back to where she was before her surgery.    Community [employer] Agencies: None Premorbid Home Care/DME Agencies: Other (Comment) ([facility]); had [facility] in the past.  Has a RW and BSC from [place].  Hoyer lift was rented but has gone back. W/C from [employer] needs a repair and CSW will give pt the phone number. Transportation available at discharge: family Resource referrals recommended: Neuropsychology, Support group (specify)  Discharge Planning Living Arrangements: Other relatives Support Systems: Other relatives Type of Residence: Private residence Insurance Resources: Multimedia programmer (specify) ([place]) Museum/gallery curator Resources: SSD, Family Support Financial Screen Referred: No Living Expenses: Lives with  family Money Management: Patient Does the patient have any problems obtaining your medications?: No Home Management: Pt's family assists with this. Patient/Family Preliminary Plans: Pt plans to return to her home with her sister to care for her.  CSW has left a message for the sister to call CSW to confirm plan. Barriers to Discharge:  (Pt has a ramped entrance and an older w/c accessible van.) Social Work Anticipated Follow Up Needs: HH/OP, Support Group Expected length of stay: 3 weeks  Clinical Impression CSW met with pt to introduce self and role of CSW, as well as to complete assessment.  Pt is familiar with CIR as she was here in October 2015, so she stated that she knows how things work.  She did not have any questions/concerns/needs at this point.  Pt is glad she was able to return to CIR.  She gave CSW permission to talk with her sister, [name], as she will be pt's primary caregiver.  CSW has left her a message and await return call.  Pt remains positive and optimistic despite her CA diagnosis and its complications.  CSW will continue to offer support and assist as needed.    Fortunata Betty, Silvestre Mesi 07/13/2015, 2:30 PM

## 2015-07-13 NOTE — Progress Notes (Signed)
Physical Therapy Session Note  Patient Details  Name: Sarah [name] MRN: [id number] Date of Birth: [birth year]  Today's Date: 07/13/2015 PT Co-Treatment Time: 1130-1200 PT Co-Treatment Time Calculation (min): 30 min  Short Term Goals: Week 1:  PT Short Term Goal 1 (Week 1): Pt will roll R/L with mod A and safe techniwue without cues PT Short Term Goal 2 (Week 1): Pt will performe supine<>sit with Max A of 1 PT Short Term Goal 3 (Week 1): Pt will perform sliding board transfer with +2 assist  PT Short Term Goal 4 (Week 1): Pt will propel WC x25' with S  Skilled Therapeutic Interventions/Progress Updates:    Patient completed Co-treat with OT Katie P, see OT note for toilet transfer to [facility]. PT instructed patient in slide board car transfer with +2 Max A for improved trunk and pelvic control. PT provided constant verbal, and tactile cues for improved UE and LE positioning, improve sequencing, and proper hips, shoulder positioning for improved success. PT was required to lift BLE into and out of car due to increased tone and decreased strength. Patient instructed in [place] mobility in hall for 125 with BUE propulsion and supervision A from PT. Patient also instructed in WC propulsion through cluttered day room on carpet with Supervision A from PT. PT provided min-mod verbal and visual instruction for improved success for BUE propulsion with turns to avoid obstacles. Patient returned to room and left with call bell in reach and all other needs met.    Therapy Documentation Precautions:  Precautions Precautions: Back, Fall Precaution Booklet Issued: No Precaution Comments: Reviewed and performed log roll technique for supine<>sit Restrictions Weight Bearing Restrictions: No   See Function Navigator for Current Functional Status.   Therapy/Group: Individual Therapy  Lorie Phenix 07/13/2015, 5:46 PM

## 2015-07-13 NOTE — Progress Notes (Signed)
Physical Therapy Session Note  Patient Details  Name: Sarah [name] MRN: [id number] Date of Birth: 12-13-1965  Today's Date: 07/13/2015 PT Individual Time: 0900-1000 PT Individual Time Calculation (min): 60 min   Short Term Goals: Week 1:  PT Short Term Goal 1 (Week 1): Pt will roll R/L with mod A and safe techniwue without cues PT Short Term Goal 2 (Week 1): Pt will performe supine<>sit with Max A of 1 PT Short Term Goal 3 (Week 1): Pt will perform sliding board transfer with +2 assist  PT Short Term Goal 4 (Week 1): Pt will propel WC x25' with S  Skilled Therapeutic Interventions/Progress Updates:    Session focused on functional w/c propulsion for UE strengthening and endurance, slideboard transfer training with focus on technique and head/hips relationship, neuro re-ed for trunk/core stabilization and elongation/contraction to increase balance during functional mobility, and adjusting legrests on w/c for improved alignment (need to change w/c but none available at this time). Pt performed transfers with max assist +2 on level surface and max A of 1 (+2 for safety) for downhill transfer with assist for foot placement and cues for head/hips relationship. Pt performed functional reaching tasks to address trunk control and balance with min assist. Rest breaks as needed.  Therapy Documentation Precautions:  Precautions Precautions: Back, Fall Precaution Booklet Issued: No Precaution Comments: Reviewed and performed log roll technique for supine<>sit Restrictions Weight Bearing Restrictions: No  Pain:  Premedicated for pain in back.    See Function Navigator for Current Functional Status.   Therapy/Group: Individual Therapy  [name], PT, DPT  07/13/2015, 10:08 AM

## 2015-07-14 ENCOUNTER — Inpatient Hospital Stay (HOSPITAL_COMMUNITY): Payer: BLUE CROSS/BLUE SHIELD | Admitting: Occupational Therapy

## 2015-07-14 ENCOUNTER — Inpatient Hospital Stay (HOSPITAL_COMMUNITY): Payer: BLUE CROSS/BLUE SHIELD | Admitting: Physical Therapy

## 2015-07-14 DIAGNOSIS — R29898 Other symptoms and signs involving the musculoskeletal system: Secondary | ICD-10-CM

## 2015-07-14 LAB — URINE CULTURE

## 2015-07-14 LAB — GLUCOSE, CAPILLARY: GLUCOSE-CAPILLARY: 133 mg/dL — AB (ref 65–99)

## 2015-07-14 NOTE — Progress Notes (Signed)
Sarah [name] is a 50 y.o. female 10/24/1965 [id number]  Subjective: No new complaints. No new problems. Feeling OK.  Objective: Vital signs in last 24 hours: Temp:  [97.6 F (36.4 C)-97.7 F (36.5 C)] 97.6 F (36.4 C) (04/22 0430) Pulse Rate:  [54-63] 54 (04/22 0430) Resp:  [18] 18 (04/22 0430) BP: (103-112)/(57-60) 103/57 mmHg (04/22 0430) SpO2:  [98 %-100 %] 98 % (04/22 0430) Weight change:  Last BM Date: 07/13/15  Intake/Output from previous day: 04/21 0701 - 04/22 0700 In: 720 [P.O.:720] Out: 2250 [Urine:2250] Last cbgs: CBG (last 3)   Recent Labs  07/12/15 1915 07/13/15 0637 07/14/15 0644  GLUCAP 152* 115* 133*     Physical Exam General: No apparent distress   HEENT: not dry Lungs: Normal effort. Lungs clear to auscultation, no crackles or wheezes. Cardiovascular: Regular rate and rhythm, no edema Abdomen: S/NT/ND; BS(+) Musculoskeletal:  unchanged Neurological: No new neurological deficits Wounds: N/A    Skin: clear   Mental state: Alert, cooperative    Lab Results: BMET    Component Value Date/Time   NA 137 07/12/2015 0558   K 4.6 07/12/2015 0558   CL 100* 07/12/2015 0558   CO2 26 07/12/2015 0558   GLUCOSE 121* 07/12/2015 0558   BUN 17 07/12/2015 0558   CREATININE 0.91 07/12/2015 0558   CREATININE 0.94 06/07/2015 1523   CALCIUM 7.9* 07/12/2015 0558   GFRNONAA >60 07/12/2015 0558   GFRNONAA 71 06/07/2015 1523   GFRAA >60 07/12/2015 0558   GFRAA 82 06/07/2015 1523   CBC    Component Value Date/Time   WBC 9.8 07/12/2015 0558   RBC 4.51 07/12/2015 0558   RBC 2.92* 01/05/2014 1305   HGB 12.7 07/12/2015 0558   HCT 38.6 07/12/2015 0558   PLT 234 07/12/2015 0558   MCV 85.6 07/12/2015 0558   MCH 28.2 07/12/2015 0558   MCHC 32.9 07/12/2015 0558   RDW 13.0 07/12/2015 0558   LYMPHSABS 1.3 07/12/2015 0558   MONOABS 0.6 07/12/2015 0558   EOSABS 0.0 07/12/2015 0558   BASOSABS 0.0 07/12/2015 0558    Studies/Results: No results  found.  Medications: I have reviewed the patient's current medications.  Assessment/Plan:  1. 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 H/o PE/Anticoagulation: Pharmaceutical: Xarelto 3. Pain Management: Controlled currently on oxycodone prn. -treat as needed 4. Mood: LCSW to follow for evaluation and support. Ego support provided. She is aware of hard work ahead of her but has good social supports at home.  5. Neuropsych: This patient is capable of making decisions on her own behalf. 6. Skin/Wound Care: Routine pressure relief measures. Continue to monitor thoracic incision for healing as well as skin due to bowel incontinence.  7. Fluids/Electrolytes/Nutrition: Monitor I/O. Appetite has been good.  8.Metastatic cancer with T3 pathologic FX with cord compression and new osseous mets T12- L3/S1:  -follow up with endo/onc about treatment plan/schedule 9. Steroid induced Hyperglycemia: Blood sugars improving with decadron taper. Monitoring BID now.  10. HTN: Continue to monitor BP bid--off prinivil at this time as blood pressures controlled. May need to be resumed as activity level improves  11. Spasticity: Continue baclofen 20 mg 5 X day with valium prn and zanaflex '2mg'$  bid 12. ABLA: hgb up to 12.7  13. Leucocytosis: Likely steroid induced. 64. 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 3. H/o depression: Resume home dose Zoloft. 15. Neurogenic bowel with obstipation -enema prn  16. Neurogenic bladder: Was able to void at home. -pt wishes to keep foley (refused removal)---will continue thru weekend with voiding trial to follow - on empiric abx (macrobid)    Length of stay, days: 3  Walker Kehr , MD 07/14/2015, 9:35 AM

## 2015-07-14 NOTE — Plan of Care (Signed)
Problem: SCI BLADDER ELIMINATION Goal: RH STG MANAGE BLADDER WITH ASSISTANCE STG Manage Bladder With Assistance  Outcome: Not Progressing Foley remains in place per patient's request

## 2015-07-14 NOTE — Progress Notes (Signed)
Physical Therapy Session Note  Patient Details  Name: Sarah [name] MRN: [id number] Date of Birth: June 03, 1965  Today's Date: 07/14/2015 PT Individual Time: 0915-1000 PT Individual Time Calculation (min): 45 min   Short Term Goals: Week 1:  PT Short Term Goal 1 (Week 1): Pt will roll R/L with mod A and safe techniwue without cues PT Short Term Goal 2 (Week 1): Pt will performe supine<>sit with Max A of 1 PT Short Term Goal 3 (Week 1): Pt will perform sliding board transfer with +2 assist  PT Short Term Goal 4 (Week 1): Pt will propel WC x25' with S  Skilled Therapeutic Interventions/Progress Updates:   Pt demonstrates improvement initiating slide board transfer with +1. Pt benefits from cues for LE placement and weight shift during transfers. Pt would continue to benefit from skilled PT services to increase functional mobility.  Therapy Documentation Precautions:  Precautions Precautions: Back, Fall Precaution Booklet Issued: No Precaution Comments: Reviewed and performed log roll technique for supine<>sit Restrictions Weight Bearing Restrictions: No Pain: Pain Assessment Pain Assessment: No/denies pain Mobility:  Transfers and bed mobility with cues for LE placement, technique, neutral spine and weight shift Other Treatments:  Pt performs anterior weight shifts, partial LAQs, ankle DF,  Hip add isometrics 2x10. Pt educated on rehab plan, safety in mobility, progressing mobility. Pt performs static and dynamic sitting balance with dual motor tasks within functional context. Chair push-ups x3 trialed without LE clearance. Pt facilitated into upright posture, Sitting EOB as active rest.   See Function Navigator for Current Functional Status.   Therapy/Group: Individual Therapy  Monia Pouch 07/14/2015, 9:20 AM

## 2015-07-15 ENCOUNTER — Inpatient Hospital Stay (HOSPITAL_COMMUNITY): Payer: BLUE CROSS/BLUE SHIELD | Admitting: Occupational Therapy

## 2015-07-15 ENCOUNTER — Inpatient Hospital Stay (HOSPITAL_COMMUNITY): Payer: BLUE CROSS/BLUE SHIELD | Admitting: Physical Therapy

## 2015-07-15 LAB — GLUCOSE, CAPILLARY: Glucose-Capillary: 171 mg/dL — ABNORMAL HIGH (ref 65–99)

## 2015-07-15 NOTE — Progress Notes (Addendum)
Occupational Therapy Session Note  Patient Details  Name: Sarah [name] MRN: [id number] Date of Birth: 16-Jun-1965  Today's Date: 07/15/2015  1st session OT Individual Time:  - 1000-1100  (60 min)                                       1445-1525  (40 min)   Short Term Goals: Week 1:  OT Short Term Goal 1 (Week 1): Pt will complete sliding board transfer to [facility] with max A OT Short Term Goal 2 (Week 1): Pt will dress UB with set-up.  OT Short Term Goal 3 (Week 1): Pt will don pants witth mod A in supine Week 2:     Skilled Therapeutic Interventions/Progress Updates:    1st session:  Pt seen for OT ADL.  Pt sitting in wc.  She propelled wc and positioned at sink with no assist.  She washed hair and dried with BUE strength and ROM.  Pt changed shirts and don sports bra  with no assistance.   She declined practice transfer with sliding board but agreed for next session.     Pt complained of right shoulder pain during upright balance activities.  Performed myofacial, myotherapy to right rhomboid and upper trap, levator scapula.  Obtained release and increased blood profusion.   Pt left sitting in w/c at end of session, set-up with all needs in reach.   2nd session:  Pt seen for transfer training, strengthening, therapeutic activity.  Pt sitting in wc with family members present.  Positioned wc next to bed.  Used sliding board and +2 for transfer to bed going towards right side.  Pt Provided verbal feedback about positioning feet, and hips.  Did posteriorl scoots to position to middle of mattress with max assist.  Did lateral scoots to side to get to head of back  Pt went from sit to supine with +2.  Remained in bed with call bell,phone within reach.    Therapy Documentation Precautions:  Precautions Precautions: Back, Fall Precaution Booklet Issued: No Precaution Comments: Reviewed and performed log roll technique for supine<>sit Restrictions Weight Bearing Restrictions: No       Pain:2/10 BLE  1st and 2nd session         See Function Navigator for Current Functional Status.   Therapy/Group: Individual Therapy  Lisa Roca 07/15/2015, 8:00 AM

## 2015-07-15 NOTE — Progress Notes (Signed)
Sarah [name] is a 50 y.o. female 14-Sep-1965 [id number]  Subjective: No new complaints. No new problems.  Objective: Vital signs in last 24 hours: Temp:  [97.9 F (36.6 C)-98 F (36.7 C)] 98 F (36.7 C) (04/23 0424) Pulse Rate:  [59-68] 68 (04/23 0424) Resp:  [18] 18 (04/23 0424) BP: (114-121)/(65-77) 121/77 mmHg (04/23 0424) SpO2:  [98 %-100 %] 98 % (04/23 0424) Weight change:  Last BM Date: 07/14/15  Intake/Output from previous day: 04/22 0701 - 04/23 0700 In: 240 [P.O.:240] Out: 1950 [Urine:1950] Last cbgs: CBG (last 3)   Recent Labs  07/12/15 1915 07/13/15 0637 07/14/15 0644  GLUCAP 152* 115* 133*     Physical Exam General: NAD. Obese HEENT: not dry Lungs: Normal effort. Lungs clear to auscultation, no crackles or wheezes. Cardiovascular: Regular rate and rhythm, no edema Abdomen: S/NT/ND; BS(+) Musculoskeletal:  unchanged Neurological: No new neurological deficits Wounds: N/A    Skin: clear   Mental state: Alert, cooperative    Lab Results: BMET    Component Value Date/Time   NA 137 07/12/2015 0558   K 4.6 07/12/2015 0558   CL 100* 07/12/2015 0558   CO2 26 07/12/2015 0558   GLUCOSE 121* 07/12/2015 0558   BUN 17 07/12/2015 0558   CREATININE 0.91 07/12/2015 0558   CREATININE 0.94 06/07/2015 1523   CALCIUM 7.9* 07/12/2015 0558   GFRNONAA >60 07/12/2015 0558   GFRNONAA 71 06/07/2015 1523   GFRAA >60 07/12/2015 0558   GFRAA 82 06/07/2015 1523   CBC    Component Value Date/Time   WBC 9.8 07/12/2015 0558   RBC 4.51 07/12/2015 0558   RBC 2.92* 01/05/2014 1305   HGB 12.7 07/12/2015 0558   HCT 38.6 07/12/2015 0558   PLT 234 07/12/2015 0558   MCV 85.6 07/12/2015 0558   MCH 28.2 07/12/2015 0558   MCHC 32.9 07/12/2015 0558   RDW 13.0 07/12/2015 0558   LYMPHSABS 1.3 07/12/2015 0558   MONOABS 0.6 07/12/2015 0558   EOSABS 0.0 07/12/2015 0558   BASOSABS 0.0 07/12/2015 0558    Studies/Results: No results found.  Medications: I have reviewed  the patient's current medications.  Assessment/Plan:  1. 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 H/o PE/Anticoagulation: Pharmaceutical: Xarelto 3. Pain Management: Controlled currently on oxycodone prn. -treat as needed 4. Mood: LCSW to follow for evaluation and support. Ego support provided. She is aware of hard work ahead of her but has good social supports at home.  5. Neuropsych: This patient is capable of making decisions on her own behalf. 6. Skin/Wound Care: Routine pressure relief measures. Continue to monitor thoracic incision for healing as well as skin due to bowel incontinence.  7. Fluids/Electrolytes/Nutrition: Monitor I/O. Appetite has been good.  8.Metastatic cancer with T3 pathologic FX with cord compression and new osseous mets T12- L3/S1:  -follow up with endo/onc about treatment plan/schedule 9. Steroid induced Hyperglycemia: Blood sugars improving with decadron taper. Monitoring BID now.  10. HTN: Continue to monitor BP bid--off prinivil at this time as blood pressures controlled. May need to be resumed as activity level improves  11. Spasticity: Continue baclofen 20 mg 5 X day with valium prn and zanaflex '2mg'$  bid 12. ABLA: hgb up to 12.7  13. Leucocytosis: Likely steroid induced. 84. 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 69. H/o depression: Resume home dose Zoloft. 15. Neurogenic bowel with obstipation -enema prn 16. Neurogenic bladder: Was able  to void at home. -pt wishes to keep foley (refused removal)---will continue thru weekend with voiding trial to follow - on empiric abx (macrobid)  Cont current Rx    Length of stay, days: 4  Walker Kehr , MD 07/15/2015, 8:45 AM

## 2015-07-15 NOTE — Progress Notes (Addendum)
Physical Therapy Session Note  Patient Details  Name: Sarah [name] MRN: [id number] Date of Birth: 04-18-1965  Today's Date: 07/15/2015 PT Individual Time: 8333-8329  PT Individual Time Calculation (min): 60 min , 45 min  Short Term Goals: Week 1:  PT Short Term Goal 1 (Week 1): Pt will roll R/L with mod A and safe techniwue without cues PT Short Term Goal 2 (Week 1): Pt will performe supine<>sit with Max A of 1 PT Short Term Goal 3 (Week 1): Pt will perform sliding board transfer with +2 assist  PT Short Term Goal 4 (Week 1): Pt will propel WC x25' with S  Skilled Therapeutic Interventions/Progress Updates:   Pt continues to demonstrate limited LE and core activation hindering functional mobility. Pt limited in pm session by anxiety claiming she was going to fall despite having buttocks completely on surface. +2 helped to alleviate anxiety. Pt would continue to benefit from skilled PT services to increase functional mobility.   Therapy Documentation Precautions:  Precautions Precautions: Back, Fall Precaution Booklet Issued: No Precaution Comments: Reviewed and performed log roll technique for supine<>sit Restrictions Weight Bearing Restrictions: No Pain: Pain Assessment Pain Assessment: 0-10 Pain Score: 2  Pain Location: Leg Mobility:  Max A with cues for weight shift, sequencing, and technique initially then total A +2 Other Treatments:  Tx 1: Pt performs hip add, gastroc, hip ext, and HS stretches 2x30". Pt performs heel slides, hip add isometrics, hip abd AROM, manually resisted PF,and resisted ankle DF 2x10. Pt educated on rehab plan, safety in mobility, progressing mobility, and rehab progress.  Tx 2: Pt educated on rehab plan, progressing mobility, and benefits of OOB. Pt perform HS isometrics, hip add, anterior weight shifts, scap pinches 2x10. Sitting EOM x10' with rest breaks x1' with back support. Pt perform w/c mobility to and from room.   See Function Navigator for  Current Functional Status.   Therapy/Group: Individual Therapy  Monia Pouch 07/15/2015, 9:20 AM

## 2015-07-15 NOTE — Plan of Care (Signed)
Problem: SCI BLADDER ELIMINATION Goal: RH STG MANAGE BLADDER WITH ASSISTANCE STG Manage Bladder With Assistance  Outcome: Not Progressing Foley cath in place

## 2015-07-16 ENCOUNTER — Inpatient Hospital Stay (HOSPITAL_COMMUNITY): Payer: BLUE CROSS/BLUE SHIELD | Admitting: Occupational Therapy

## 2015-07-16 ENCOUNTER — Inpatient Hospital Stay (HOSPITAL_COMMUNITY): Payer: BLUE CROSS/BLUE SHIELD

## 2015-07-16 ENCOUNTER — Ambulatory Visit: Payer: BLUE CROSS/BLUE SHIELD | Admitting: Physical Therapy

## 2015-07-16 DIAGNOSIS — B9620 Unspecified Escherichia coli [E. coli] as the cause of diseases classified elsewhere: Secondary | ICD-10-CM

## 2015-07-16 DIAGNOSIS — N390 Urinary tract infection, site not specified: Secondary | ICD-10-CM

## 2015-07-16 LAB — GLUCOSE, CAPILLARY
GLUCOSE-CAPILLARY: 124 mg/dL — AB (ref 65–99)
Glucose-Capillary: 107 mg/dL — ABNORMAL HIGH (ref 65–99)
Glucose-Capillary: 171 mg/dL — ABNORMAL HIGH (ref 65–99)

## 2015-07-16 NOTE — Progress Notes (Signed)
[facility] PHYSICAL MEDICINE & REHABILITATION     PROGRESS NOTE    Subjective/Complaints: Had a farily good weekend. Tone still an issue. Thinks that zanaflex might be causing some nausea   ROS: Pt denies fever, rash/itching, headache, blurred or double vision, nausea, vomiting, abdominal pain,   chest pain, shortness of breath, palpitations, dysuria, dizziness,   bleeding, anxiety, or depression   Objective: Vital Signs: Blood pressure 111/66, pulse 58, temperature 98.3 F (36.8 C), temperature source Oral, resp. rate 18, height '5\' 11"'$  (1.803 m), weight 116.756 kg (257 lb 6.4 oz), last menstrual period 06/06/2015, SpO2 98 %. No results found. No results for input(s): WBC, HGB, HCT, PLT in the last 72 hours. No results for input(s): NA, K, CL, GLUCOSE, BUN, CREATININE, CALCIUM in the last 72 hours.  Invalid input(s): CO CBG (last 3)   Recent Labs  07/14/15 0644 07/15/15 1845 07/16/15 0603  GLUCAP 133* 171* 124*    Wt Readings from Last 3 Encounters:  07/11/15 116.756 kg (257 lb 6.4 oz)  07/06/15 117.3 kg (258 lb 9.6 oz)  04/20/14 127.007 kg (280 lb)    Physical Exam:  Constitutional: She is oriented to person, place, and time. She appears well-developed and well-nourished.  Obese female.  HENT:  Head: Normocephalic and atraumatic.  Mouth/Throat: Oropharynx is clear and moist.  Eyes: Conjunctivae and EOM are normal. Pupils are equal, round, and reactive to light.  Neck: Normal range of motion. Neck supple.  Cardiovascular: Normal rate and regular rhythm.  No murmur heard. Respiratory: Effort normal. No stridor. She has decreased breath sounds in the right lower field and the left lower field. She has no wheezes. She has no rales.  GI: Soft. Bowel sounds are normal. She exhibits no distension. There is no tenderness. There is no guarding.  Genitourinary:      Foley in place.  Musculoskeletal: She exhibits no edema or tenderness.  Neurological: She is alert and  oriented to person, place, and time. She displays abnormal reflex. She exhibits abnormal muscle tone. Coordination abnormal.  Flat but speech clear and able to follow commands without difficulty.  BUE 5/5 proximal to distal.  RLE: hip flexion, knee extension 2+/5, ankle dorsi/plantar flexion 3+/5 LLE: 2-/5 proximal to distal  Extensor tone.--3-4/5  Tight heel cords bilaterally.  DTR 3+ b/l LE  Skin: Skin is warm and dry.  Upper thoracic incision with dry scabs under honeycomb dressing.  Psychiatric: Her speech is normal and behavior is normal. Judgment and thought content normal. Her affect is blunt. Cognition and memory are normal.   Assessment/Plan: 1. 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 Physiatrist is providing close team supervision and 24 hour management of active medical problems listed below. Physiatrist and rehab team continue to assess barriers to discharge/monitor patient progress toward functional and medical goals.  Function:  Bathing Bathing position Bathing activity did not occur: Refused Position:  (LB in bed, UB seated EOB)  Bathing parts Body parts bathed by patient: Right arm, Left arm, Chest, Abdomen, Right upper leg, Left upper leg Body parts bathed by helper: Right lower leg, Left lower leg, Back  Bathing assist Assist Level: 2 helpers      Upper Body Dressing/Undressing Upper body dressing   What is the patient wearing?: Pull over shirt/dress     Pull over shirt/dress - Perfomed by patient: Thread/unthread right sleeve, Thread/unthread left sleeve, Put head through opening  Pull over shirt/dress - Perfomed by helper: Pull shirt over trunk        Upper body assist        Lower Body Dressing/Undressing Lower body dressing Lower body dressing/undressing activity did not occur: Refused What is the patient wearing?: Pants,  Shoes       Pants- Performed by helper: Thread/unthread right pants leg, Thread/unthread left pants leg, Pull pants up/down   Non-skid slipper socks- Performed by helper: Don/doff right sock, Don/doff left sock       Shoes - Performed by helper: Don/doff right shoe, Don/doff left shoe, Fasten right, Fasten left          Lower body assist Assist for lower body dressing: 2 Medical sales representative     Toileting steps completed by helper: Adjust clothing prior to toileting, Performs perineal hygiene, Adjust clothing after toileting (bed level)    Toileting assist Assist level: Two helpers   Transfers Chair/bed transfer Chair/bed transfer activity did not occur: Safety/medical concerns (Pt unable to successfuly complete attempt) Chair/bed transfer method: Lateral scoot Chair/bed transfer assist level: 2 helpers Chair/bed transfer assistive device: Sliding board, Armrests, Bedrails     Locomotion Ambulation Ambulation activity did not occur: N/A (WC level)         Wheelchair Wheelchair activity did not occur: Safety/medical concerns Type: Motorized Max wheelchair distance: 120 Assist Level: Supervision or verbal cues  Cognition Comprehension Comprehension assist level: Follows complex conversation/direction with no assist  Expression Expression assist level: Expresses complex ideas: With no assist  Social Interaction Social Interaction assist level: Interacts appropriately with others with medication or extra time (anti-anxiety, antidepressant).  Problem Solving Problem solving assist level: Solves complex problems: Recognizes & self-corrects  Memory Memory assist level: Complete Independence: No helper    Medical Problem List and Plan: 1. 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 H/o PE/Anticoagulation: Pharmaceutical: Xarelto 3.  Pain Management: Controlled currently on oxycodone prn.  -treat as needed 4. Mood: LCSW to follow for evaluation and support. Ego support provided. She is aware of hard work ahead of her but has good social supports at home.  5. Neuropsych: This patient is capable of making decisions on her own behalf. 6. Skin/Wound Care: Routine pressure relief measures. Continue to monitor thoracic incision for healing as well as skin due to bowel incontinence.  7. Fluids/Electrolytes/Nutrition: Monitor I/O. Appetite has been good so far.   8.Metastatic cancer with T3 pathologic FX with cord compression and new osseous mets T12- L3/S1:   -follow up with endo/onc about treatment plan/schedule 9. Steroid induced Hyperglycemia: Blood sugars improving with decadron taper. Will monitor BID for now.  10. HTN: Continue to monitor BP bid--off prinivil at this time as blood pressures controlled. May need to be resumed as activity level improves  11. Spasticity: Continue baclofen 20 mg 5 X day with valium prn.   -added zanaflex '2mg'$  bid. Increase this week as tolerated 12. ABLA: hgb up to 12.7  13. Leucocytosis: Likely steroid induced.   Urine as below 14. 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 H/o depression: Resume home dose Zoloft. 15. Neurogenic bowel with Obstipation: changing to pm bowel program per pt preference  -enema prn 16. Neurogenic bladder: dc foley cath----voiding trial/scheduled i/o caths  -e coli UTI sens to macrobid. Continue for 7 days  LOS (Days) 5 A FACE TO FACE EVALUATION  WAS PERFORMED  Ralyn Stlaurent T 07/16/2015 9:07 AM

## 2015-07-16 NOTE — Progress Notes (Addendum)
Occupational Therapy Session Note  Patient Details  Name: Sarah [name] MRN: [id number] Date of Birth: 09/10/1965  Today's Date: 07/16/2015 OT Individual Time: 1000-1100 and 1330-1500 OT Individual Time Calculation (min): 60 min and 90 min   Short Term Goals: Week 1:  OT Short Term Goal 1 (Week 1): Pt will complete sliding board transfer to [facility] with max A OT Short Term Goal 2 (Week 1): Pt will dress UB with set-up.  OT Short Term Goal 3 (Week 1): Pt will don pants witth mod A in supine  Skilled Therapeutic Interventions/Progress Updates:    Session One: Pt seen for OT tx session focusing on ADl re-training, functional mobility, and functional transfers. Pt in supine upon arrival, agreeable to tx session. She donned B shoes from supine position. Education and demonstration provided regarding bed mobility techniques for LB dressing including circle sitting and long sitting. Pt opted to use hospital bed functions to assist with circle sit position. Due to tight hip flexors, pt required assist for maintaining semi-circle sit position while she donned shoes.  She transferred to EOB with max A and VCs for technique. Chuck pad used to assist with positioning of hips when sitting EOB. +2 sliding board transfer completed to w/c. Discussed pressure relieving techniques in manual w/c via lateral leans. Pt unable to functionally complete w/c push up for efficient pressure relieving.  She was taken to therapy gym total A for time and energy conservation. She was educated and instructed on parts of w/c and how to set-up w/c in prep for sliding board transfer. +2 sliding board transfer completed to therapy mat. She returned to supine on mat and measurements obtained for leg loops to be made. Seated EOM, pt completed functional reaching task, required to reach into all planes in order to address functional sitting balance.   Session Two: Pt seen for OT ADL bathing/dressing session. Pt sitting in w/c upon  arrival, agreeable to tx session. Maximove used for all transfers.  She transferred into roll in shower chair and bathed seated in roll in shower with assist for LB. She transferred into bed for dressing to be completed.  Passive stretches to hip flexors and hamstrings completed at end of session for increased mobility/ stretching during ADL tasks. Pt left in supine at end of session, all needs in reach. Pt noted to have red area on sacrum, RN made aware.   Therapy Documentation Precautions:  Precautions Precautions: Back, Fall Precaution Booklet Issued: No Precaution Comments: Reviewed and performed log roll technique for supine<>sit Restrictions Weight Bearing Restrictions: No Pain: Pain Assessment Pain Assessment: No/denies pain  See Function Navigator for Current Functional Status.   Therapy/Group: Individual Therapy  Lewis, Jaelynn Pozo C 07/16/2015, 7:18 AM

## 2015-07-16 NOTE — Progress Notes (Signed)
Recreational Therapy Assessment and Plan  Patient Details  Name: Sarah [name] MRN: [id number] Date of Birth: Oct 19, 1965 Today's Date: 07/16/2015  Rehab Potential: Good ELOS: 2.5 weeks   Assessment Clinical Impression:  Problem List:  Patient Active Problem List   Diagnosis Date Noted  . 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 Neurogenic bladder   . History of pulmonary embolism   . Post-operative pain   . Metastatic cancer ([place])   . Steroid-induced hyperglycemia   . Benign essential HTN   . Acute blood loss anemia   . Leukocytosis   . Depression   . Obstipation   . Thoracic spine tumor 07/06/2015  . 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 Cord compression ([place])   . Encounter for intubation   . Lower extremity weakness   . Respiratory failure ([place])   . Postoperative hypothyroidism   . Peripheral edema 06/07/2015  . Hypothyroidism, postop 03/08/2015  . 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 Dysuria 09/04/2014  . Hypertension 05/01/2014  . Ingrown right big toenail 05/01/2014  . Hypokalemia 05/01/2014  . GERD (gastroesophageal reflux disease) 02/14/2014  . Dysphagia, pharyngoesophageal phase 02/06/2014  . Type 2 diabetes mellitus without complication ([place]) 66/08/43  . Constipation   . Neurogenic bowel   . 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 ([place]) 01/20/2014  . Metastatic cancer to spine ([place]) 01/19/2014  . Rotator cuff tendonitis   . Acute postoperative respiratory failure ([place]) 01/09/2014  . 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 Leg weakness, bilateral 12/13/2013    Past Medical History:  Past Medical History  Diagnosis Date  . Hypertension   . DDD (degenerative disc  disease), cervical   . DJD (degenerative joint disease)   . Obesity   . Chronic back pain   . Cancer [facility])     FNA of thyroid positive for onconytic/hurthle cell carcinoma  . History of rectal fissure   . Rotator cuff tendonitis right  . Pulmonary embolus, right ([place]) 2015  . HIT (heparin-induced thrombocytopenia) [facility])    Past Surgical History:  Past Surgical History  Procedure Laterality Date  . Tonsillectomy    . Laminectomy N/A 12/14/2013    Procedure: THORACIC LAMINECTOMY FOR TUMOR THORACIC THREE; Surgeon: Ashok Pall, MD; Location: [facility]UR[facility] ORS; Service: Neurosurgery; Laterality: N/A; THORACIC LAMINECTOMY FOR TUMOR THORACIC THREE  . Posterior lumbar fusion 4 level N/A 12/30/2013    Procedure: Thoracic one-Thoracic five posterior thoracic fusion with pedicle screws; Surgeon: Ashok Pall, MD; Location: [facility]; Service: Neurosurgery; Laterality: N/A; Thoracic one-Thoracic five posterior thoracic fusion with pedicle screws  . Thyroidectomy N/A 01/09/2014    Procedure: TOTAL THYROIDECTOMY; Surgeon: Izora Gala, MD; Location: [facility]; Service: ENT; Laterality: N/A;  . Laminectomy N/A 07/05/2015    Procedure: THORACIC LAMINECTOMY FOR TUMOR; Surgeon: Ashok Pall, MD; Location: [place] NEURO ORS; Service: Neurosurgery; Laterality: N/A;    Assessment & Plan Clinical Impression:Dakayla Chauncey Cruel Foucher is a 50 y.o. 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 T3 tumor resected 01/09/14 and subsequent chemo/XRT. She has been undergoing therapy with ability to transfer with SB/min assist and attempts at standing--06/24/15. She has had 3 week history of increase in spasticity and increase in BLE edema. She was admitted on 07/05/15 with B/B incontinence  and decline in motor function. MRI done  showing chronic T3 pathologic fracture with increased bulky epidural tumor with severe cord compression, new abnormal marrow signal T12- L3 with new osseous metastatic disease and similar findings S1. She was taken to OR emergently for T3 laminectomy for subtotal tumor resection by Dr. Christella Noa and treated with multiple units PRBC for ABLA.  currently on decadron taper, Xarelto to be resumed today and has plans for chemo in 2-3 weeks per input from Hem[facility] at [facility]. Therapy ongoing and patient has had increase in spasticity, worsening of BLE weakenss with decline in functional mobility as well as ability to complete ADL tasks. CIR recommended for follow up therapy.       Pt presents with decreased activity tolerance, decreased functional mobility, muscle paralysis, decreased balance, decreased coordination Limiting pt's independence with leisure/community pursuits.   Leisure History/Participation Premorbid leisure interest/current participation: Community - Grocery store;Community - Shopping mall;Sports - Exercise (Comment) (swimming at the aquatic center) Expression Interests: Music (Comment) Other Leisure Interests: Television;Movies;Cooking/Baking;Housework Leisure Participation Style: With Family/Friends Awareness of Community Resources: Excellent Psychosocial / Spiritual Patient agreeable to Pet Therapy: No Does patient have pets?: No Social interaction - Mood/Behavior: Cooperative Academic librarian Appropriate for Education?: Yes Recreational Therapy Orientation Orientation -Reviewed with patient: Available activity resources Strengths/Weaknesses Patient Strengths/Abilities: Willingness to participate;Active premorbidly Patient weaknesses: Physical limitations TR Patient demonstrates impairments in the following area(s): Endurance;Motor;Pain;Safety;Skin Integrity  Plan Rec Therapy Plan Is patient appropriate for Therapeutic Recreation?: Yes Rehab Potential: Good Treatment  times per week: Min 1 time per week >20 minutes Estimated Length of Stay: 2.5 weeks  Recommendations for other services: None  Discharge Criteria: Patient will be discharged from TR if patient refuses treatment 3 consecutive times without medical reason.  If treatment goals not met, if there is a change in medical status, if patient makes no progress towards goals or if patient is discharged from hospital.  The above assessment, treatment plan, treatment alternatives and goals were discussed and mutually agreed upon: by patient  [place] 07/16/2015, 3:58 PM

## 2015-07-16 NOTE — Plan of Care (Signed)
Problem: RH PAIN MANAGEMENT Goal: RH STG PAIN MANAGED AT OR BELOW PT'S PAIN GOAL Pain less than or equal to 2  Outcome: Progressing Patient c/o pain this morning just prior to therapy & asked for Tylenol. This afternoon she asked for more Tylenol, but she was an hour shy of the prn Q4h schedule. She was reluctant to take her prn Percocet for fear that it may make her sick. Asked resident if it happened before & she stated that she did not know what made her sick. Did inform resident that she had an option of taking 1 or 2 pills & also reminded resident that she had another occupational therapy session at 1:30pm. Resident did take 1 tablet with reassessment in 1 hour

## 2015-07-16 NOTE — Progress Notes (Signed)
Physical Therapy Session Note  Patient Details  Name: Sarah [name] MRN: [id number] Date of Birth: 1965-09-02  Today's Date: 07/16/2015 PT Individual Time: 1100-1200 PT Individual Time Calculation (min): 60 min   Short Term Goals: Week 1:  PT Short Term Goal 1 (Week 1): Pt will roll R/L with mod A and safe techniwue without cues PT Short Term Goal 2 (Week 1): Pt will performe supine<>sit with Max A of 1 PT Short Term Goal 3 (Week 1): Pt will perform sliding board transfer with +2 assist  PT Short Term Goal 4 (Week 1): Pt will propel WC x25' with S  Skilled Therapeutic Interventions/Progress Updates:    Session focused on functional slideboard transfer with focus on technique and head/hips relationship, sitting balance/endurance, w/c propulsion for general strengthening and endurance, and neuro re-ed in standing frame to address weightbearing through BLE, postural control, and balance training. Pt able to tolerate x 4 min and x 5 min in standing frame with towels in front of knees due to discomfort and cues for upright posture. Pt performing functional reaching tasks to play tic-tac-toe with 1 UE support and 2 sets of 10 reps of scapular retractions with cues to maintain posture throughout.  Pt's personal power w/c in room but pt declined getting into it, stating she wanted to use the manual w/c more to build up strength. Educated pt on pressure relief in manual vs. Power - verbalized understanding. Pt also reports [facility] had processed an order for a manual w/c for her already as well. Will follow up with CSW in regards to this as well.  Therapy Documentation Precautions:  Precautions Precautions: Back, Fall Precaution Booklet Issued: No Precaution Comments: Reviewed and performed log roll technique for supine<>sit Restrictions Weight Bearing Restrictions: No   Pain: Premedicated for back pain.   See Function Navigator for Current Functional Status.   Therapy/Group: Individual  Therapy and Co-Treatment with TR   Lars Masson, PT, DPT  07/16/2015, 12:13 PM

## 2015-07-16 NOTE — Progress Notes (Signed)
Occupational Therapy Session Note  Patient Details  Name: GENI SKORUPSKI MRN: [id number] Date of Birth: 1966/03/06  Today's Date: 07/16/2015 OT Individual Time:  -   [id number]   (30 min)      Short Term Goals: Week 1:  OT Short Term Goal 1 (Week 1): Pt will complete sliding board transfer to [facility] with max A OT Short Term Goal 2 (Week 1): Pt will dress UB with set-up.  OT Short Term Goal 3 (Week 1): Pt will don pants witth mod A in supine Week 2:     Skilled Therapeutic Interventions/Progress Updates:    Pt lying in bed.  Performed rolling, UE exercises.  Pt rolled to right and left with bed rails and SBA.  Pt assisted pulling pants over hips once OT threaded on to legs.  Provided orange theraband for UE exercies in the bed.  Pt returned demonstration of exercises.  Remained in bed with call bell,phone within reach.   Therapy Documentation Precautions:  Precautions Precautions: Back, Fall Precaution Booklet Issued: No Precaution Comments: Reviewed and performed log roll technique for supine<>sit Restrictions Weight Bearing Restrictions: No   Pain:  3/10 both legs in quads         See Function Navigator for Current Functional Status.   Therapy/Group: Individual Therapy  Lisa Roca 07/16/2015, 7:48 AM

## 2015-07-16 NOTE — Progress Notes (Signed)
Patients foley catheter was removed this morning. No noted voiding since then. Bladder was scanned & had 256m urine. Patient was encouraged to increase fluids. No need to in/out cath at this time. Will continue to monitor KAntoine Primas RN

## 2015-07-17 ENCOUNTER — Inpatient Hospital Stay (HOSPITAL_COMMUNITY): Payer: BLUE CROSS/BLUE SHIELD | Admitting: Occupational Therapy

## 2015-07-17 ENCOUNTER — Inpatient Hospital Stay (HOSPITAL_COMMUNITY): Payer: BLUE CROSS/BLUE SHIELD

## 2015-07-17 ENCOUNTER — Inpatient Hospital Stay (HOSPITAL_COMMUNITY): Payer: BLUE CROSS/BLUE SHIELD | Admitting: *Deleted

## 2015-07-17 LAB — GLUCOSE, CAPILLARY
GLUCOSE-CAPILLARY: 119 mg/dL — AB (ref 65–99)
GLUCOSE-CAPILLARY: 229 mg/dL — AB (ref 65–99)

## 2015-07-17 MED ORDER — TIZANIDINE HCL 2 MG PO TABS
2.0000 mg | ORAL_TABLET | Freq: Four times a day (QID) | ORAL | Status: DC
Start: 2015-07-17 — End: 2015-07-19
  Administered 2015-07-17 – 2015-07-18 (×7): 2 mg via ORAL
  Filled 2015-07-17 (×8): qty 1

## 2015-07-17 NOTE — Plan of Care (Signed)
Problem: SCI BLADDER ELIMINATION Goal: RH STG MANAGE BLADDER WITH ASSISTANCE STG Manage Bladder With mod Assistance  Outcome: Not Progressing In & out cath

## 2015-07-17 NOTE — Progress Notes (Signed)
Physical Therapy Session Note  Patient Details  Name: GENEVIEVE ARBAUGH MRN: [id number] Date of Birth: 1965-04-25  Today's Date: 07/17/2015 PT Individual Time: 1300-1400 PT Individual Time Calculation (min): 60 min   Short Term Goals: Week 1:  PT Short Term Goal 1 (Week 1): Pt will roll R/L with mod A and safe techniwue without cues PT Short Term Goal 2 (Week 1): Pt will performe supine<>sit with Max A of 1 PT Short Term Goal 3 (Week 1): Pt will perform sliding board transfer with +2 assist  PT Short Term Goal 4 (Week 1): Pt will propel WC x25' with S  Skilled Therapeutic Interventions/Progress Updates:   Session focused on lateral and anterior leans in w/c for sling placement, maxi move <> cardiac tilt table with +2 assist, and neuro re-ed on cardiac tilt table tolerating 70 degrees x 22 min. On cardiac tilt table addressed neuro re-ed for postural control, weightbearing through BLE, and standing tolerance/balance while filling goodie bags with pt using BUE for reaching. End of session left up in w/c with all needs in reach.  Therapy Documentation Precautions:  Precautions Precautions: Back, Fall Precaution Booklet Issued: No Precaution Comments: Reviewed and performed log roll technique for supine<>sit Restrictions Weight Bearing Restrictions: No  Pain: Premedicated for pain.   See Function Navigator for Current Functional Status.   Therapy/Group: Individual Therapy  [name], PT, DPT  07/17/2015, 2:08 PM

## 2015-07-17 NOTE — Patient Care Conference (Signed)
Inpatient RehabilitationTeam Conference and Plan of Care Update Date: 07/17/2015   Time: 2:15 PM    Patient Name: Sarah [name]      Medical Record Number: [id number]  Date of Birth: 04-Jun-1965 Sex: Female         Room/Bed: 4W19C/4W19C-01 Payor Info: Payor: [place] / Plan: BCBS OTHER / Product Type: *No Product type* /    Admitting Diagnosis: T3 Fx  tumor resection  Admit Date/Time:  07/11/2015  4:48 PM Admission Comments: No comment available   Primary Diagnosis:  Spastic paraparesis (HCC) Principal Problem: Spastic paraparesis [facility])  Patient Active Problem List   Diagnosis Date Noted  . 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 Neurogenic bladder   . History of pulmonary embolism   . Post-operative pain   . Metastatic cancer ([place])   . Steroid-induced hyperglycemia   . Benign essential HTN   . Acute blood loss anemia   . Leukocytosis   . Depression   . Obstipation   . Thoracic spine tumor 07/06/2015  . 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 Cord compression ([place])   . Encounter for intubation   . Lower extremity weakness   . Respiratory failure ([place])   . Postoperative hypothyroidism   . Peripheral edema 06/07/2015  . Hypothyroidism, postop 03/08/2015  . 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 Dysuria 09/04/2014  . Hypertension 05/01/2014  . Ingrown right big toenail 05/01/2014  . Hypokalemia 05/01/2014  . GERD (gastroesophageal reflux disease) 02/14/2014  . Dysphagia, pharyngoesophageal phase 02/06/2014  . Type 2 diabetes mellitus without complication ([place]) 49/17/9150  . Constipation   . Neurogenic bowel   . 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 ([place]) 01/20/2014  . Metastatic cancer to spine ([place]) 01/19/2014  . Rotator cuff tendonitis   . Acute postoperative respiratory failure ([name]) 01/09/2014  . 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 Hurthle  cell carcinoma of thyroid ([place]) 12/29/2013  . Leg weakness, bilateral 12/13/2013    Expected Discharge Date: Expected Discharge Date: 08/01/15  Team Members Present: Physician leading conference: Dr. Alger Simons Social Worker Present: Lennart Pall, LCSW Nurse Present: Rayetta Pigg, RN PT Present: Canary Brim, PT OT Present: Napoleon Form, OT SLP Present: Weston Anna, SLP PPS Coordinator present : Daiva Nakayama, RN, CRRN     Current Status/Progress Goal Weekly Team Focus  Medical   spastic paraplegia after recurrent thyroid tumor. spasiticy severe. bowel and bladder function affected more severely. emotionally doing better  establish program for bowels/bladder as well activity program which family can follow at home  cath schedule, rxspasticity,    Bowel/Bladder   requires In & out cath after foley d/cd, dulcolax suppository q8pm (requesting an earlier time like 6pm), inc of bowel  able to void without cath      Swallow/Nutrition/ Hydration             ADL's   +2 functional transfers; max- total toileting; steadying assist for sitting balance during UB bathing/dressing; mod-max LB bathing/dressing  Min A overall  ADL re-training, AE training, activity tolerance, transfers, UE strengthening   Mobility   +2 for slideboard transfers and max +2 for bed mobility; standing with lift equipment, supervision w/c propulsion in manual w/c; supervision to min assist sitting balance  min assist overall w/c level  endurance, strengthening, neuro re-ed, education, standing to tolerance, transfers   Communication  Safety/Cognition/ Behavioral Observations            Pain   back pain managed with tylenol  <3  assess pain & medicate prn   Skin   surgical incision to back, no skin breakdown  remain free of breakdown or infection  monitor skin qshift    Rehab Goals Patient on target to meet rehab goals: Yes *See Care Plan and progress notes for long and short-term  goals.  Barriers to Discharge: severity of deficits, patient's size    Possible Resolutions to Barriers:  ongoing mgt of spasticity, adaptive equipment, transfer training    Discharge Planning/Teaching Needs:  Home with mother and sister to provide 24/7 assistance as they were PTA  ongoing   Team Discussion:  Returning pt here ~ 1 1/2 yrs ago with recurrent tumor.  Incision site looking good. Significant tone in LE - MD addressing.  Sliding board tfs with +2;  Working in standing frame.  Goals at min assist w/c level.  PT to follow up on power w/c adjustment needs.  SW to check status of manual w/c.    Revisions to Treatment Plan:  None   Continued Need for Acute Rehabilitation Level of Care: The patient requires daily medical management by a physician with specialized training in physical medicine and rehabilitation for the following conditions: Daily direction of a multidisciplinary physical rehabilitation program to ensure safe treatment while eliciting the highest outcome that is of practical value to the patient.: Yes Daily medical management of patient stability for increased activity during participation in an intensive rehabilitation regime.: Yes Daily analysis of laboratory values and/or radiology reports with any subsequent need for medication adjustment of medical intervention for : Neurological problems;Post surgical problems;Urological problems  Vikki Gains 07/18/2015, 2:03 PM

## 2015-07-17 NOTE — Progress Notes (Signed)
Occupational Therapy Session Note  Patient Details  Name: TIFFANEE MCNEE MRN: [id number] Date of Birth: Mar 23, 1966  Today's Date: 07/17/2015 OT Individual Time: 0930-1015 OT Individual Time Calculation (min): 45 min    Short Term Goals: Week 1:  OT Short Term Goal 1 (Week 1): Pt will complete sliding board transfer to [facility] with max A OT Short Term Goal 2 (Week 1): Pt will dress UB with set-up.  OT Short Term Goal 3 (Week 1): Pt will don pants witth mod A in supine  Skilled Therapeutic Interventions/Progress Updates:    Pt seen for OT tx session focusing on functional transfers, sitting balance, and UE strengthening. Pt in w/c upon arrival agreeable to tx session. She self propelled w/c to therapy gym for UE strengthening. +2 sliding board transfer completed to therapy mat. Pt able to set-up w/c in prep for transfer with 1 VC. While seated on EOM, pt completed UE strengthening exercises utilizing level II (orange) theraband. Close supervision with occasional min A for sitting balance. Completed UE strengthening exercises to all major muscle groups x2 sets of 10 reps with rest breaks provided btwn trials and verbal and demonstrational cues for proper form and technique. Pt self propelled w/c back to room at end of session, opting to stay sitting in chair. Left with all needs in reach.   Therapy Documentation Precautions:  Precautions Precautions: Back, Fall Precaution Booklet Issued: No Precaution Comments: Reviewed and performed log roll technique for supine<>sit Restrictions Weight Bearing Restrictions: No Pain: Pain Assessment Pain Assessment: 0-10 Pain Score: 3  Pain Type: Acute pain;Surgical pain Pain Location: Back Pain Orientation: Mid;Upper Pain Descriptors / Indicators: Aching Pain Intervention(s): Repositioned  See Function Navigator for Current Functional Status.   Therapy/Group: Individual Therapy  Lewis, Thorin Starner C 07/17/2015, 7:09 AM

## 2015-07-17 NOTE — Progress Notes (Signed)
[facility] PHYSICAL MEDICINE & REHABILITATION     PROGRESS NOTE    Subjective/Complaints: Overall doing ok. A little flustered that breakfast, IC of bladder and therapy all coinciding (and then I walked in too). Both legs still tight. No nausea with tizanidine yesterday  ROS: Pt denies fever, rash/itching, headache, blurred or double vision, nausea, vomiting, abdominal pain,   chest pain, shortness of breath, palpitations, dysuria, dizziness,   bleeding, anxiety, or depression   Objective: Vital Signs: Blood pressure 107/60, pulse 55, temperature 98 F (36.7 C), temperature source Oral, resp. rate 17, height '5\' 11"'$  (1.803 m), weight 116.756 kg (257 lb 6.4 oz), last menstrual period 06/06/2015, SpO2 100 %. No results found. No results for input(s): WBC, HGB, HCT, PLT in the last 72 hours. No results for input(s): NA, K, CL, GLUCOSE, BUN, CREATININE, CALCIUM in the last 72 hours.  Invalid input(s): CO CBG (last 3)   Recent Labs  07/16/15 1136 07/16/15 1617 07/17/15 0608  GLUCAP 107* 171* 119*    Wt Readings from Last 3 Encounters:  07/11/15 116.756 kg (257 lb 6.4 oz)  07/06/15 117.3 kg (258 lb 9.6 oz)  04/20/14 127.007 kg (280 lb)    Physical Exam:  Constitutional: She is oriented to person, place, and time. She appears well-developed and well-nourished.  Obese female.  HENT:  Head: Normocephalic and atraumatic.  Mouth/Throat: Oropharynx is clear and moist.  Eyes: Conjunctivae and EOM are normal. Pupils are equal, round, and reactive to light.  Neck: Normal range of motion. Neck supple.  Cardiovascular: Normal rate and regular rhythm.  No murmur heard. Respiratory: Effort normal. No stridor. She has decreased breath sounds in the right lower field and the left lower field. She has no wheezes. She has no rales.  GI: Soft. Bowel sounds are normal. She exhibits no distension. There is no tenderness. There is no guarding.  Genitourinary:        Musculoskeletal: She  exhibits no edema or tenderness.  Neurological: She is alert and oriented to person, place, and time. She displays abnormal reflex. She exhibits abnormal muscle tone. Coordination abnormal.  Flat but speech clear and able to follow commands without difficulty.  BUE 5/5 proximal to distal.  RLE: hip flexion, knee extension 2+/5, ankle dorsi/plantar flexion 3+/5 LLE: 2-/5 proximal to distal  Extensor tone.--3-4/5  Tight heel cords bilaterally.  DTR 3+ b/l LE  Skin: Skin is warm and dry.  Upper thoracic incision with dry scabs under honeycomb dressing still in place  Psychiatric: Her speech is normal and behavior is normal. Judgment and thought content normal. Her affect is blunt. Cognition and memory are normal.   Assessment/Plan: 1. 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 Physiatrist is providing close team supervision and 24 hour management of active medical problems listed below. Physiatrist and rehab team continue to assess barriers to discharge/monitor patient progress toward functional and medical goals.  Function:  Bathing Bathing position Bathing activity did not occur: Refused Position: Shower  Bathing parts Body parts bathed by patient: Right arm, Left arm, Chest, Abdomen, Right upper leg, Left upper leg, Front perineal area Body parts bathed by helper: Buttocks, Back, Left lower leg, Right lower leg  Bathing assist Assist Level: 2 helpers      Upper Body Dressing/Undressing Upper body dressing   What is the patient wearing?: Pull over shirt/dress     Pull over shirt/dress - Perfomed by  patient: Thread/unthread right sleeve, Thread/unthread left sleeve, Put head through opening, Pull shirt over trunk Pull over shirt/dress - Perfomed by helper: Pull shirt over trunk        Upper body assist Assist Level: Set up   Set up : To obtain  clothing/put away  Lower Body Dressing/Undressing Lower body dressing Lower body dressing/undressing activity did not occur: Refused What is the patient wearing?: Pants     Pants- Performed by patient: Pull pants up/down Pants- Performed by helper: Thread/unthread right pants leg, Thread/unthread left pants leg, Pull pants up/down   Non-skid slipper socks- Performed by helper: Don/doff right sock, Don/doff left sock     Shoes - Performed by patient: Don/doff right shoe, Don/doff left shoe Shoes - Performed by helper: Fasten right, Fasten left          Lower body assist Assist for lower body dressing: 2 Helpers      Toileting Toileting     Toileting steps completed by helper: Adjust clothing prior to toileting, Performs perineal hygiene, Adjust clothing after toileting (bed level)    Toileting assist Assist level: Two helpers   Transfers Chair/bed transfer Chair/bed transfer activity did not occur: Safety/medical concerns (Pt unable to successfuly complete attempt) Chair/bed transfer method: Lateral scoot Chair/bed transfer assist level: 2 helpers Chair/bed transfer assistive device: Sliding board, Armrests, Bedrails     Locomotion Ambulation Ambulation activity did not occur: N/A (WC level)         Wheelchair Wheelchair activity did not occur: Safety/medical concerns Type: Motorized (family will bring in, manual wc in use for now) Max wheelchair distance: 150' Assist Level: Supervision or verbal cues  Cognition Comprehension Comprehension assist level: Follows complex conversation/direction with no assist  Expression Expression assist level: Expresses complex ideas: With no assist  Social Interaction Social Interaction assist level: Interacts appropriately with others - No medications needed.  Problem Solving Problem solving assist level: Solves complex problems: Recognizes & self-corrects  Memory Memory assist level: Complete Independence: No helper    Medical  Problem List and Plan: 1. 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 H/o PE/Anticoagulation: Pharmaceutical: Xarelto 3. Pain Management: Controlled currently on oxycodone prn.  -treat as needed 4. Mood: LCSW to follow for evaluation and support. Ego support provided. She is aware of hard work ahead of her but has good social supports at home.  5. Neuropsych: This patient is capable of making decisions on her own behalf. 6. Skin/Wound Care: Routine pressure relief measures. Continue to monitor thoracic incision for healing as well as skin due to bowel incontinence.  7. Fluids/Electrolytes/Nutrition: Monitor I/O. Appetite has been good so far.   8.Metastatic cancer with T3 pathologic FX with cord compression and new osseous mets T12- L3/S1:   -follow up with endo/onc about treatment plan/schedule 9. Steroid induced Hyperglycemia: Blood sugars improving with decadron taper. Will monitor BID for now.  10. HTN: Continue to monitor BP bid--off prinivil at this time as blood pressures controlled. May need to be resumed as activity level improves  11. Spasticity: Continue baclofen 20 mg 5 X day with valium prn.   -increase zanaflex to '2mg'$  qid 12. ABLA: hgb up to 12.7  13. Leucocytosis: Likely steroid induced.   Urine as below 14. 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 H/o depression: Resume home dose Zoloft. 15. Neurogenic bowel : changed to pm bowel program per pt preference  -  enema prn 16. Neurogenic bladder:  voiding trial/scheduled i/o caths----keep volumes 300-500cc  -e coli UTI sens to macrobid. Continue for 7 days   LOS (Days) 6 A FACE TO FACE EVALUATION WAS PERFORMED  Risa Auman T 07/17/2015 9:02 AM

## 2015-07-17 NOTE — Progress Notes (Signed)
Physical Therapy Session Note  Patient Details  Name: Sarah [name] MRN: [id number] Date of Birth: 30-Mar-1965  Today's Date: 07/17/2015 PT Individual Time: 1130-1200 PT Individual Time Calculation (min): 30 min   Short Term Goals: Week 1:  PT Short Term Goal 1 (Week 1): Pt will roll R/L with mod A and safe techniwue without cues PT Short Term Goal 2 (Week 1): Pt will performe supine<>sit with Max A of 1 PT Short Term Goal 3 (Week 1): Pt will perform sliding board transfer with +2 assist  PT Short Term Goal 4 (Week 1): Pt will propel WC x25' with S  Skilled Therapeutic Interventions/Progress Updates:    Pt self propelled w/c down to therapy gym for functional strengthening and endurance. PT applied kinesiotape to R and L scapular region where pt reporting pain/discomfort (tight muscles noted) for relaxation and pain relief in power strip (pt educated on its purpose and agreeable). Instructed in seated therex with orange theraband x 10 reps of shoulder abduction, PNF diagonals, and scapular retraction rows. Educated on decreasing w/c propulsion if continues to bother shoulders and back (h/o RTC injuries) to preserve shoulder function especially as pt has option for power w/c (declined using this morning).  Therapy Documentation Precautions:  Precautions Precautions: Back, Fall Precaution Booklet Issued: No Precaution Comments: Reviewed and performed log roll technique for supine<>sit Restrictions Weight Bearing Restrictions: No  Pain: Reports some pain in shoulders and back. Premedicated.  See Function Navigator for Current Functional Status.   Therapy/Group: Individual Therapy  [name], PT, DPT  07/17/2015, 12:15 PM

## 2015-07-17 NOTE — Progress Notes (Signed)
Recreational Therapy Session Note  Patient Details  Name: Sarah [name] MRN: [id number] Date of Birth: [birth year] Today's Date: 07/17/2015  Pain: no c/o pain, premedicated Skilled Therapeutic Interventions/Progress Updates: session focused on activity tolerance, weight bearing through BLE's, standing tolerance.  Pt transferred from w/c to tilt table via maxi move with +2 total assist.  Once on tilt table, pt in supported standing at 70 degrees for 22 minutes reaching to fill goodie bags.  Therapy/Group: Co-Treatment   Milena Liggett 07/17/2015, 3:47 PM

## 2015-07-17 NOTE — Progress Notes (Signed)
Occupational Therapy Session Note  Patient Details  Name: Sarah [name] MRN: [id number] Date of Birth: May 09, 1965  Today's Date: 07/17/2015 OT Individual Time: 0930-1015 OT Individual Time Calculation (min): 45 min    Short Term Goals: Week 1:  OT Short Term Goal 1 (Week 1): Pt will complete sliding board transfer to [facility] with max A OT Short Term Goal 2 (Week 1): Pt will dress UB with set-up.  OT Short Term Goal 3 (Week 1): Pt will don pants witth mod A in supine  Skilled Therapeutic Interventions/Progress Updates:    1:1 self care retraining at bed level. Pt with leg spasms making rolling difficult for pericare requiring A to help break up tone. Performed peri and bottom hygiene in supine with min A for rolling and positioning. Brief donned. Came to EOB with HOB elevated with a for bilateral LEs and min A for trunk control. Pt required extra time to come into upright sitting position. Pt washed UB and doned shirt with close supervision using bedside table to assist with dynamic balance during tasks. Bed elevated off floor a little to dangle LEs. Pt able to thread both LEs using a reacher with min A for dynamic balance. Lateral leans (2x to each side) on an elevated surface (on to a pillow and blanket next to her). Pt able to perform slide board transfer with min A with a +2 person holding equipment for safety. A to manage LE during transfer one time. Pt perform grooming at sink mod I .  Therapy Documentation Precautions:  Precautions Precautions: Back, Fall Precaution Booklet Issued: No Precaution Comments: Reviewed and performed log roll technique for supine<>sit Restrictions Weight Bearing Restrictions: No Pain: Pain Assessment Pain Assessment: 0-10 Pain Score: 2  Pain Type: Acute pain Pain Location: Back Pain Orientation: Mid;Upper;Lower Pain Descriptors / Indicators: Aching Pain Intervention(s): Medication (See eMAR)  See Function Navigator for Current Functional  Status.   Therapy/Group: Individual Therapy  Willeen Cass [facility] 07/17/2015, 10:17 AM

## 2015-07-18 ENCOUNTER — Inpatient Hospital Stay (HOSPITAL_COMMUNITY): Payer: BLUE CROSS/BLUE SHIELD | Admitting: Occupational Therapy

## 2015-07-18 ENCOUNTER — Inpatient Hospital Stay (HOSPITAL_COMMUNITY): Payer: BLUE CROSS/BLUE SHIELD | Admitting: Physical Therapy

## 2015-07-18 LAB — GLUCOSE, CAPILLARY
GLUCOSE-CAPILLARY: 143 mg/dL — AB (ref 65–99)
Glucose-Capillary: 118 mg/dL — ABNORMAL HIGH (ref 65–99)

## 2015-07-18 MED ORDER — DEXAMETHASONE 2 MG PO TABS
2.0000 mg | ORAL_TABLET | Freq: Two times a day (BID) | ORAL | Status: DC
  Administered 2015-07-18 – 2015-08-01 (×29): 2 mg via ORAL
  Filled 2015-07-18 (×28): qty 1

## 2015-07-18 MED ORDER — DEXAMETHASONE SODIUM PHOSPHATE 4 MG/ML IJ SOLN
2.0000 mg | Freq: Two times a day (BID) | INTRAMUSCULAR | Status: DC
  Filled 2015-07-18 (×29): qty 0.5

## 2015-07-18 NOTE — Progress Notes (Signed)
Occupational Therapy Session Note  Patient Details  Name: Sarah [name] MRN: [id number] Date of Birth: 07-May-1965  Today's Date: 07/18/2015 OT Individual Time: 1400-1500 OT Individual Time Calculation (min): 60 min    Short Term Goals: Week 1:  OT Short Term Goal 1 (Week 1): Pt will complete sliding board transfer to [facility] with max A OT Short Term Goal 2 (Week 1): Pt will dress UB with set-up.  OT Short Term Goal 3 (Week 1): Pt will don pants witth mod A in supine  Skilled Therapeutic Interventions/Progress Updates:    Pt seen for OT tx session focusing on functional transfers, w/c propulsion, and UE strengthening. Pt in power w/c upon arrival, voicing increased fatigue from previous PT session. She declined practicing BSC transfer due to fatigue. She was agreeable to going outside and practicing w/c propulsion in community setting.  Pt transferred power w/c> manual chair with mod A and +2 available for safety. She was taken outside total A for time and energy conservation. Pt practiced propelling w/c over uneven terrain and up/down small slopes with supervision and increased time. While seated in chair, pt completed zoom ball activity for UE strengthening and endurance. Completed x2 trials with rest breaks provided. Discussed at length d/c planning and pt's priorities for therapy. Pt really wanting to work on transfers for family to better be able to manage/ assist her.  She returned to unit and completed min-mod A sliding board transfer. With significantly increased time and effort, pt able to slide on board with steadying assist and VCs for technique, however, once fatigued required increased assist. Pt left in supine at end of session, all needs in reach. RN made aware of pt's need for cathing at this time as part of bowel/ bladder program.   Therapy Documentation Precautions:  Precautions Precautions: Back, Fall Precaution Booklet Issued: No Precaution Comments: Reviewed and performed log  roll technique for supine<>sit Restrictions Weight Bearing Restrictions: No Pain: Pain Assessment Pain Assessment: No/denies pain  See Function Navigator for Current Functional Status.   Therapy/Group: Individual Therapy  Lewis, Kaisyn Millea C 07/18/2015, 3:06 PM

## 2015-07-18 NOTE — Progress Notes (Signed)
Social Work Patient ID: Sarah [name], female   DOB: 1965/06/12, 50 y.o.   MRN: [id number]   Met with pt, sister and mother yesterday afternoon following team conference.  All aware and agreeable with targeted d/c date of 08/01/15 with min assist w/c level goals.  All expressing concerns about whether pt can reach that level and sister asking (away from pt) what will happen if she does not reach a level they can manage at home.  We discussed the possible option of SNF if insurance will approve.  At this point, they all are hopeful she can reach the targeted goals and will "take it a day at a time."  Will continue to follow.  Ilene Witcher, LCSW

## 2015-07-18 NOTE — Progress Notes (Signed)
Physical Therapy Session Note  Patient Details  Name: Sarah [name] MRN: [id number] Date of Birth: 1965/03/31  Today's Date: 07/18/2015 PT Individual Time: 1012-1108 and 1300-1400 PT Individual Time Calculation (min): 56 min and 60 min  Short Term Goals: Week 1:  PT Short Term Goal 1 (Week 1): Pt will roll R/L with mod A and safe techniwue without cues PT Short Term Goal 2 (Week 1): Pt will performe supine<>sit with Max A of 1 PT Short Term Goal 3 (Week 1): Pt will perform sliding board transfer with +2 assist  PT Short Term Goal 4 (Week 1): Pt will propel WC x25' with S  Skilled Therapeutic Interventions/Progress Updates:   Pt received in power w/c.  Discussed with pt that ATP may be present this pm to look at her power w/c and perform adjustments needed.  Pt states that the only thing she needs looked at is a piece that broke off of the power controls/joy stick but the seating system is fine.  Pt performed w/c mobility with power w/c in room and in controlled environment Mod I.  Performed lateral scooting in slideboard w/c > mat with max A of one person with therapist providing verbal cues and facilitation at trunk/pelvis to maintain trunk elongation during anterior lean for weight shift forwards over BOS to assist with scooting.  On mat performed NMR for LE and pelvis; see below for details.  Pt educated on use of [employer] as training for slideboard transfers.  Pt performed lateral scooting back to w/c with use of Be[employer] board and mod A of one person with pt demonstrating improved postural control, balance and weight shifting during scooting.  Pt returned to room in power w/c Mod I and left with all items within reach.  PM session: pt received in power w/c; ATP had not arrived yet to assess chair.  Pt agreeable to tilt table.  Pt transferred w/c > tilt table with maxi move and +2 total A.  Pt raised incrementally to 70 deg upright position for focus on WB through bilat LE and postural control  training during dynamic UE movements of: alternating >> bilat UE volley ball taps, volley ball taps with 2lb bar, and bilat UE strengthening exercises with 2lb bar x 2-3 minutes at a time.  Pt reclined back to 40 deg to perform bilat LE presses (hip/knee extensions) and alternating LE marching with AAROM.  Returned to supine and transferred back to power w/c with maxi move total A +2.  Pt handed off to OT for final session.  Therapy Documentation Precautions:  Precautions Precautions: Back, Fall Precaution Booklet Issued: No Precaution Comments: Reviewed and performed log roll technique for supine<>sit Restrictions Weight Bearing Restrictions: No Pain: Pain Assessment Pain Assessment: No/denies pain Other Treatments: Treatments Neuromuscular Facilitation: Lower Extremity;Left;Right;Activity to increase timing and sequencing;Activity to increase sustained activation;Activity to increase lateral weight shifting;Activity to increase anterior-posterior weight shifting performing LE ankle DF and hip activation extension with foot on balance disk with focus on forwards weight shifting of COG over BOS; also performed reaching out of BOS to L and R and forwards with focus on head/trunk righting and postural control with trunk elongation and shortening laterally with min-mod A for balance and cues to maintain head in midline during weight shifting.     See Function Navigator for Current Functional Status.   Therapy/Group: Individual Therapy  Raylene Everts [facility] 07/18/2015, 12:30 PM

## 2015-07-18 NOTE — Progress Notes (Signed)
[place] PHYSICAL MEDICINE & REHABILITATION     PROGRESS NOTE    Subjective/Complaints: Up a little late last night with delayed bowel program and I/C. Feels that zanaflex is helping a bit.   ROS: Pt denies fever, rash/itching, headache, blurred or double vision, nausea, vomiting, abdominal pain,   chest pain, shortness of breath, palpitations, dysuria, dizziness,   bleeding, anxiety, or depression   Objective: Vital Signs: Blood pressure 99/59, pulse 57, temperature 98.2 F (36.8 C), temperature source Oral, resp. rate 18, height '5\' 11"'$  (1.803 m), weight 116.756 kg (257 lb 6.4 oz), last menstrual period 06/06/2015, SpO2 100 %. No results found. No results for input(s): WBC, HGB, HCT, PLT in the last 72 hours. No results for input(s): NA, K, CL, GLUCOSE, BUN, CREATININE, CALCIUM in the last 72 hours.  Invalid input(s): CO CBG (last 3)   Recent Labs  07/17/15 0608 07/17/15 1710 07/18/15 0638  GLUCAP 119* 229* 143*    Wt Readings from Last 3 Encounters:  07/11/15 116.756 kg (257 lb 6.4 oz)  07/06/15 117.3 kg (258 lb 9.6 oz)  04/20/14 127.007 kg (280 lb)    Physical Exam:  Constitutional: She is oriented to person, place, and time. She appears well-developed and well-nourished.  Obese female.  HENT:  Head: Normocephalic and atraumatic.  Mouth/Throat: Oropharynx is clear and moist.  Eyes: Conjunctivae and EOM are normal. Pupils are equal, round, and reactive to light.  Neck: Normal range of motion. Neck supple.  Cardiovascular: Normal rate and regular rhythm.  No murmur heard. Respiratory: Effort normal. No stridor. She has decreased breath sounds in the right lower field and the left lower field. She has no wheezes. She has no rales.  GI: Soft. Bowel sounds are normal. She exhibits no distension. There is no tenderness. There is no guarding.  Genitourinary:        Musculoskeletal: She exhibits no edema or tenderness.  Neurological: She is alert and oriented to  person, place, and time. She displays abnormal reflex. She exhibits abnormal muscle tone. Coordination abnormal.  Flat but speech clear and able to follow commands without difficulty.  BUE 5/5 proximal to distal.  RLE: hip flexion, knee extension 2+/5, ankle dorsi/plantar flexion 3+/5 LLE: 2-/5 proximal to distal  Extensor tone.--3-4/5, perhaps slightly improved Tight heel cords remain bilaterally.  DTR 3+ b/l LE  Skin: Skin is warm and dry.  Upper thoracic incision with dry scabs under honeycomb dressing still in place  Psychiatric: Her speech is normal and behavior is normal. Judgment and thought content normal. Her affect is blunt. Cognition and memory are normal.   Assessment/Plan: 1. 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 Physiatrist is providing close team supervision and 24 hour management of active medical problems listed below. Physiatrist and rehab team continue to assess barriers to discharge/monitor patient progress toward functional and medical goals.  Function:  Bathing Bathing position Bathing activity did not occur: Refused Position: Shower  Bathing parts Body parts bathed by patient: Right arm, Left arm, Chest, Abdomen, Right upper leg, Left upper leg, Front perineal area Body parts bathed by helper: Buttocks, Back, Left lower leg, Right lower leg  Bathing assist Assist Level: 2 helpers      Upper Body Dressing/Undressing Upper body dressing   What is the patient wearing?: Pull over shirt/dress     Pull over shirt/dress - Perfomed by patient: Thread/unthread right sleeve, Thread/unthread left sleeve,  Put head through opening, Pull shirt over trunk Pull over shirt/dress - Perfomed by helper: Pull shirt over trunk        Upper body assist Assist Level: Set up   Set up : To obtain clothing/put away  Lower Body  Dressing/Undressing Lower body dressing Lower body dressing/undressing activity did not occur: Refused What is the patient wearing?: Pants     Pants- Performed by patient: Pull pants up/down Pants- Performed by helper: Thread/unthread right pants leg, Thread/unthread left pants leg, Pull pants up/down   Non-skid slipper socks- Performed by helper: Don/doff right sock, Don/doff left sock     Shoes - Performed by patient: Don/doff right shoe, Don/doff left shoe Shoes - Performed by helper: Fasten right, Fasten left          Lower body assist Assist for lower body dressing: 2 Helpers      Toileting Toileting     Toileting steps completed by helper: Adjust clothing prior to toileting, Performs perineal hygiene, Adjust clothing after toileting    Toileting assist Assist level: Two helpers   Transfers Chair/bed transfer Chair/bed transfer activity did not occur: Safety/medical concerns (Pt unable to successfuly complete attempt) Chair/bed transfer method: Lateral scoot Chair/bed transfer assist level: 2 helpers Chair/bed transfer assistive device: Sliding board, Armrests, Bedrails     Locomotion Ambulation Ambulation activity did not occur: N/A (WC level)         Wheelchair Wheelchair activity did not occur: Safety/medical concerns Type: Motorized (family will bring in, manual wc in use for now) Max wheelchair distance: 150' Assist Level: Supervision or verbal cues  Cognition Comprehension Comprehension assist level: Follows complex conversation/direction with extra time/assistive device  Expression Expression assist level: Expresses complex ideas: With no assist  Social Interaction Social Interaction assist level: Interacts appropriately with others - No medications needed.  Problem Solving Problem solving assist level: Solves complex problems: Recognizes & self-corrects  Memory Memory assist level: Complete Independence: No helper    Medical Problem List and Plan: 1.  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 H/o PE/Anticoagulation: Pharmaceutical: Xarelto 3. Pain Management: Controlled currently on oxycodone prn.  -treat as needed 4. Mood: LCSW to follow for evaluation and support. Ego support provided. She is aware of hard work ahead of her but has good social supports at home.  5. Neuropsych: This patient is capable of making decisions on her own behalf. 6. Skin/Wound Care: Routine pressure relief measures. Continue to monitor thoracic incision for healing as well as skin due to bowel incontinence.  7. Fluids/Electrolytes/Nutrition: Monitor I/O. Appetite has been good so far.   8.Metastatic cancer with T3 pathologic FX with cord compression and new osseous mets T12- L3/S1:   -follow up with endo/onc about treatment plan/schedule 9. Steroid induced Hyperglycemia: Blood sugars improving with decadron taper. Will monitor BID for now.  10. HTN: Continue to monitor BP bid--off prinivil at this time as blood pressures controlled. May need to be resumed as activity level improves  11. Spasticity: Continue baclofen 20 mg 5 X day with valium prn.   -increased zanaflex to '2mg'$  qid, titrate further tomorrow 12. ABLA: hgb up to 12.7  13. Leucocytosis: Likely steroid induced.   Urine as below 14. 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 H/o depression: Resume home dose Zoloft. 15. Neurogenic bowel : changed to pm bowel program per pt preference  -colace in am  -enema  prn 16. Neurogenic bladder:  voiding trial/scheduled i/o caths----keep volumes 300-500cc  -e coli UTI sens to macrobid. Continue for 7 days (thru tomorrow)  LOS (Days) 7 A FACE TO FACE EVALUATION WAS PERFORMED  Elayna Tobler T 07/18/2015 8:55 AM

## 2015-07-18 NOTE — Progress Notes (Signed)
Occupational Therapy Session Note  Patient Details  Name: Sarah [name] MRN: [id number] Date of Birth: 03/29/1965  Today's Date: 07/18/2015 OT Individual Time: 0626-9485 OT Individual Time Calculation (min): 60 min    Short Term Goals: Week 1:  OT Short Term Goal 1 (Week 1): Pt will complete sliding board transfer to [facility] with max A OT Short Term Goal 2 (Week 1): Pt will dress UB with set-up.  OT Short Term Goal 3 (Week 1): Pt will don pants witth mod A in supine  Skilled Therapeutic Interventions/Progress Updates:    Session ONe: PT seen for OT ADL bathing/dressing sessiojn. Pt in supine upon arrival, voicing rough night due to getting suppository late and reported that she feels she had incontinent episode as she has not been bathed since last night. NT made aware and cathing performed by NT while therapist assisted with LEs as pt with increased tone in B LEs affecting functional positioning. Educated regarding positioning for breaking up tone in prep for functional tasks.  Hygiene and new brief donned in supine. She transferred to EOB with +2 assist using hospital bed functions and chuck pad used to  Position hips.  She completed bathing/dressing seated on EOB with close supervision and occasional min A for dynamic sitting balance. Bed elevated for feet to dangle and pt threaded B LEs into pants using reach. Lateral leans completed x3 to each side with assist provided for getting pants up all the way.   Encouraged pt to sit up in power w/c at end of session as she is able to successfully pressure relief in power chair vs standard w/c. +2 sliding board transfer completed to power w/c. LE lateral support not brought to unit with pt's chair (left at pt's home). QRB placed around pt's legs for ideal positioning. Pt left sitting up in chair at end of session, all needs in reach.   Therapy Documentation Precautions:  Precautions Precautions: Back, Fall Precaution Booklet Issued: No Precaution  Comments: Reviewed and performed log roll technique for supine<>sit Restrictions Weight Bearing Restrictions: No Pain: Pain Assessment Pain Assessment: 0-10 Pain Score: 4  Pain Type: Acute pain Pain Location: Back Pain Orientation: Upper;Mid Pain Frequency: Constant Patients Stated Pain Goal: 2 Pain Intervention(s): Repositioned Multiple Pain Sites: No  See Function Navigator for Current Functional Status.   Therapy/Group: Individual Therapy  Lewis, Riven Beebe C 07/18/2015, 7:11 AM

## 2015-07-19 ENCOUNTER — Inpatient Hospital Stay (HOSPITAL_COMMUNITY): Payer: BLUE CROSS/BLUE SHIELD | Admitting: Occupational Therapy

## 2015-07-19 ENCOUNTER — Inpatient Hospital Stay (HOSPITAL_COMMUNITY): Payer: BLUE CROSS/BLUE SHIELD

## 2015-07-19 ENCOUNTER — Encounter (HOSPITAL_COMMUNITY): Payer: BLUE CROSS/BLUE SHIELD

## 2015-07-19 LAB — GLUCOSE, CAPILLARY
Glucose-Capillary: 102 mg/dL — ABNORMAL HIGH (ref 65–99)
Glucose-Capillary: 166 mg/dL — ABNORMAL HIGH (ref 65–99)

## 2015-07-19 MED ORDER — TIZANIDINE HCL 2 MG PO TABS
2.0000 mg | ORAL_TABLET | Freq: Three times a day (TID) | ORAL | Status: DC
  Administered 2015-07-19 – 2015-08-01 (×38): 2 mg via ORAL
  Filled 2015-07-19 (×40): qty 1

## 2015-07-19 MED ORDER — TIZANIDINE HCL 4 MG PO TABS
4.0000 mg | ORAL_TABLET | Freq: Every day | ORAL | Status: DC
  Administered 2015-07-19 – 2015-07-31 (×13): 4 mg via ORAL
  Filled 2015-07-19 (×13): qty 1

## 2015-07-19 NOTE — Progress Notes (Signed)
Physical Therapy Session Note  Patient Details  Name: Sarah [name] MRN: [id number] Date of Birth: 1966/01/31  Today's Date: 07/19/2015 PT Individual Time: 1000-1058 PT Individual Time Calculation (min): 58 min   Short Term Goals: Week 1:  PT Short Term Goal 1 (Week 1): Pt will roll R/L with mod A and safe techniwue without cues PT Short Term Goal 2 (Week 1): Pt will performe supine<>sit with Max A of 1 PT Short Term Goal 3 (Week 1): Pt will perform sliding board transfer with +2 assist  PT Short Term Goal 4 (Week 1): Pt will propel WC x25' with S  Skilled Therapeutic Interventions/Progress Updates:   Pt up in power w/c - Serafina Royals, ATP from [employer] to stop by after PT session to finish adjustment to LE legrests that was not completed yesterday. Pt maneuvered w/c mod I in room and on unit to/from therapy. Focused on transfer training, neuro re-ed for sitting balance and scooting while performing management of BLE using leg loops (just had been finished and issued to pt this session) while EOM, propped on elbow to sitting position x 5 reps each direction to aid with overall bed mobility at supervision level, and neuro re-ed with partial sit <> stands using [employer] and elevated mat table to 25" x 10 reps total with rest breaks as needed for weightbearing through BLE and functional upright tolerance. Pt unable to achieve full upright standing position and maintaining forward flexed posture. Performed transfers with [facility] board with mod assist and +2 for safety and assist with placement of board and balance while therapist manually adjusted legrest positioning on power w/c. Cues for head-hips relationship during scooting and transfers. End of session returned to room mod I.  Therapy Documentation Precautions:  Precautions Precautions: Back, Fall Precaution Booklet Issued: No Precaution Comments: Reviewed and performed log roll technique for supine<>sit Restrictions Weight Bearing Restrictions:  No  Pain: Premedicated for back pain. Reports increasing "heaviness" in BLE today.   See Function Navigator for Current Functional Status.   Therapy/Group: Individual Therapy  [name], PT, DPT  07/19/2015, 12:10 PM

## 2015-07-19 NOTE — Progress Notes (Signed)
Physical Therapy Weekly Progress Note  Patient Details  Name: Sarah [name] MRN: [id number] Date of Birth: June 20, 1965  Beginning of progress report period: July 12, 2015 End of progress report period: July 20, 2015  Today's Date: 07/20/2015 PT Individual Time: 1000-1100 PT Individual Time Calculation (min): 60 min  Session focused on addressing functional slideboard transfers to regular bed in ADL apartment, bed mobility retraining including supine <> sit and rolling using leg loops in regular bed, supine neuro re-ed for stretching, ROM, and active assisted muscle activation in BLE, and d/c planning. Pt able to perform transfers with overall min to mod assist - still requiring mod assist for positioning once in chair or on soft bed with cues for technique and up to mod assist for BLE management. Pt able to demonstrate bed mobility sequence in order to maintain back precautions x 2 repetitions demonstrating good carryover of technique. Long term goal changed to mod assist for sit to supine due to pt unable to manage BLE and maintain back precautions at this time, especially with increased tone. Pt maneuvered w/c mod I in apartment and to/from therapy. She requires assist for manual placement of leg rests for transfers.   Patient has met 4 of 4 short term goals. Pt is making good functional progress towards goals. Pt continues with BLE spasms and spasticity with impaired motor limiting overall functional mobility. Pt issued leg loops this week and working on BLE management using them. Pt has progressed to overall mod assist for level transfers using slideboard. Pt is using personal power w/c for mobility and pressure relief. Planning for pt to use hospital bed at home upon d/c.  Patient continues to demonstrate the following deficits: paraparesis, decreased strength, decreased endurance, decreased balance, impaired tone, and therefore will continue to benefit from skilled PT intervention to enhance  overall performance with activity tolerance, balance, postural control, ability to compensate for deficits and knowledge of precautions.  Patient progressing toward long term goals..  Continue plan of care.  PT Short Term Goals Week 1:  PT Short Term Goal 1 (Week 1): Pt will roll R/L with mod A and safe techniwue without cues PT Short Term Goal 1 - Progress (Week 1): Met PT Short Term Goal 2 (Week 1): Pt will performe supine<>sit with Max A of 1 PT Short Term Goal 2 - Progress (Week 1): Met PT Short Term Goal 3 (Week 1): Pt will perform sliding board transfer with +2 assist  PT Short Term Goal 3 - Progress (Week 1): Met PT Short Term Goal 4 (Week 1): Pt will propel WC x25' with S PT Short Term Goal 4 - Progress (Week 1): Met Week 2:  PT Short Term Goal 1 (Week 2): Pt will perform consistent transfers with slideboard with mod assist  PT Short Term Goal 2 (Week 2): Pt will be able to perform bed mobility with mod assist for supine <> sit using hosptial bed and AE as needed PT Short Term Goal 3 (Week 2): Pt will be able to tolerate standing with lift equipment at least 2 x a week for tone management, WB through BLE, postural control retraining  Skilled Therapeutic Interventions/Progress Updates:  Balance/vestibular training;Community reintegration;Discharge planning;Disease management/prevention;DME/adaptive equipment instruction;Functional mobility training;Neuromuscular re-education;Pain management;Patient/family education;Psychosocial support;Skin care/wound management;Therapeutic Exercise;Splinting/orthotics;Therapeutic Activities;UE/LE Strength taining/ROM;UE/LE Coordination activities;Wheelchair propulsion/positioning   Therapy Documentation Precautions:  Precautions Precautions: Back, Fall Precaution Booklet Issued: No Precaution Comments: Reviewed and performed log roll technique for supine<>sit Restrictions Weight Bearing Restrictions: No   Pain: Premedicated -  reports pain is  ok.  See Function Navigator for Current Functional Status.  Therapy/Group: Individual Therapy  [name], PT, DPT  07/20/2015, 11:45 AM

## 2015-07-19 NOTE — Progress Notes (Signed)
Occupational Therapy Session Note  Patient Details  Name: GRACIEANN STANNARD MRN: [id number] Date of Birth: [birth year]  Today's Date: 07/19/2015 OT Individual Time: 1300-1400 OT Individual Time Calculation (min): 60 min    Short Term Goals: Week 1:  OT Short Term Goal 1 (Week 1): Pt will complete sliding board transfer to [facility] with max A OT Short Term Goal 2 (Week 1): Pt will dress UB with set-up.  OT Short Term Goal 3 (Week 1): Pt will don pants witth mod A in supine     Skilled Therapeutic Interventions/Progress Updates:    Pt seen for skilled OT to facilitate functional mobility with use of maximove lift to roll in shower chair to allow pt to work on self bathing in the shower. Pt needed assist to fully sit back into chair as she felt she was sliding forward in chair. Pt was able to bathe self except for buttocks, lower legs and back. Pt then transferred back to bed to prepare for dressing with OT in the next session. Pt tolerated session well.  Pt's next OT arrived at end of session.  Therapy Documentation Precautions:  Precautions Precautions: Back, Fall Precaution Booklet Issued: No Precaution Comments: Reviewed and performed log roll technique for supine<>sit Restrictions Weight Bearing Restrictions: No   Pain: Pain Assessment Pain Assessment: No/denies pain ADL:  See Function Navigator for Current Functional Status.   Therapy/Group: Individual Therapy  Jerica Creegan 07/19/2015, 3:20 PM

## 2015-07-19 NOTE — Progress Notes (Signed)
Occupational Therapy Weekly Progress Note  Patient Details  Name: Sarah [name] MRN: [id number] Date of Birth: [birth year]  Beginning of progress report period: July 12, 2015 End of progress report period: July 19, 2015  Today's Date: 07/19/2015 OT Individual Time: 7106-2694 and 1400-1500 OT Individual Time Calculation (min): 60 min and 60 min   Patient has met 1 of 3 short term goals.  Pt making slow but steady progress towards OT goals. She requires mod A overall for functional transfers, however, +2 required to steady equipment. She has been dressing EOB vs in supine as pt with increased tone in B LEs limiting functional positioning for dressing. She can dress with min-mod A for LB sitting EOB.   Patient continues to demonstrate the following deficits: abnormal posture, lumbago (low back pain), muscle weakness (generalized) and paraparesis at level T-3 and therefore will continue to benefit from skilled OT intervention to enhance overall performance with BADL and Reduce care partner burden.  Patient progressing toward long term goals..  Continue plan of care.  OT Short Term Goals Week 1:  OT Short Term Goal 1 (Week 1): Pt will complete sliding board transfer to [facility] with max A OT Short Term Goal 1 - Progress (Week 1): Partly met OT Short Term Goal 2 (Week 1): Pt will dress UB with set-up.  OT Short Term Goal 2 - Progress (Week 1): Met OT Short Term Goal 3 (Week 1): Pt will don pants witth mod A in supine OT Short Term Goal 3 - Progress (Week 1): Partly met Week 2:  OT Short Term Goal 1 (Week 2): Pt will transfer to [facility] with assist of one person using sliding board in order to decrease caregiver burden OT Short Term Goal 2 (Week 2): Pt will don pants sitting EOB using lateral leans to pull pants up with supervision and AE PRN OT Short Term Goal 3 (Week 2): Pt will complete bathing/dressing session with only one rest break.  Skilled Therapeutic Interventions/Progress Updates:   Session One: Pt seen for OT session focusing on ADL re-training and functional transfers. Pt in supine upon arrival, agreeable to tx session. She wanted to trial donning pants from supine level to determine which method she liked better (supine vs lateral leans on EOB). With assist for positioning of LEs, pt able to use reacher to thread B LEs into pants. She rolled with min A to pull pants up. Pt appears less fatigued dressing in this manner vs. EOB. She transferred to EOB with mod A and dressed UB with set-up and supervision for dynamic sitting balance. She completed Beasy board transfer EOB <> BSC with mod A to get to [facility] and min A to return to EOB with +2 assist available to assist with [employer] equipment. Discussed toileting tasks and various ways of completing for energy conservation. She transferred into power w/c in same manner as described above.  She completed grooming tasks mod I at sink.  At end of sesion, pr completed UE strengthening exercises using level II theraband. Pt able to recall exercises taught in previous sessions and VCs provided for breathing technique during exercises. Pt left sitting in power chair at end of session, set-up with breakfast tray and all needs in reach.   Session Two: Pt seen for OT tx session focusing on ADL re-training. Pt in supine upon arrival with hand off from other OT. Pt just completed showering task and in supine ready to dress. Brief donned total A in supine with pt rolling  with min A for positioning. She transferred to EOB with assist for B LEs and using hospital bed functions. Bed elevated for feet to tangle and pt able to thread B LEs using reacher and completed lateral leans with min A for balance to pull pants up. Leg loops have been completed and loops donned total A. She attempted to pull ankle over knees to don shoes, however, due to increased tone pt unable to maintain position for functional use.  She transferred into w/c using sliding board with  overall min-mod A with +2 available to steady equipment.  She completed grooming tasks seated in w/c at sink, leaning forward off back of w/c to obtain items, completed grooming tasks mod I. She completed functional reaching task from w/c level, required to come forward off back of w/c for core strengthening/ stability. Completed x3 sets of 5 with rest breaks provided btwn sets. Pt left in power chair at end of session, all needs in reach.    Therapy Documentation Precautions:  Precautions Precautions: Back, Fall Precaution Booklet Issued: No Precaution Comments: Reviewed and performed log roll technique for supine<>sit Restrictions Weight Bearing Restrictions: No Pain: Pain Assessment Pain Assessment: No/denies pain  See Function Navigator for Current Functional Status.   Therapy/Group: Individual Therapy  Lewis, Brienne Liguori C 07/19/2015, 7:09 AM

## 2015-07-19 NOTE — Progress Notes (Signed)
[place] PHYSICAL MEDICINE & REHABILITATION     PROGRESS NOTE    Subjective/Complaints: Feels that legs are a little "heavier". No changes in motor function or sensation. Pain stable  ROS: Pt denies fever, rash/itching, headache, blurred or double vision, nausea, vomiting, abdominal pain,   chest pain, shortness of breath, palpitations, dysuria, dizziness,   bleeding, anxiety, or depression   Objective: Vital Signs: Blood pressure 130/80, pulse 60, temperature 98.5 F (36.9 C), temperature source Oral, resp. rate 18, height '5\' 11"'$  (1.803 m), weight 112.492 kg (248 lb), last menstrual period 06/06/2015, SpO2 98 %. No results found. No results for input(s): WBC, HGB, HCT, PLT in the last 72 hours. No results for input(s): NA, K, CL, GLUCOSE, BUN, CREATININE, CALCIUM in the last 72 hours.  Invalid input(s): CO CBG (last 3)   Recent Labs  07/18/15 0638 07/18/15 1646 07/19/15 0626  GLUCAP 143* 118* 102*    Wt Readings from Last 3 Encounters:  07/19/15 112.492 kg (248 lb)  07/06/15 117.3 kg (258 lb 9.6 oz)  04/20/14 127.007 kg (280 lb)    Physical Exam:  Constitutional: She is oriented to person, place, and time. She appears well-developed and well-nourished.  Obese female.  HENT:  Head: Normocephalic and atraumatic.  Mouth/Throat: Oropharynx is clear and moist.  Eyes: Conjunctivae and EOM are normal. Pupils are equal, round, and reactive to light.  Neck: Normal range of motion. Neck supple.  Cardiovascular: Normal rate and regular rhythm.  No murmur heard. Respiratory: Effort normal. No stridor. She has decreased breath sounds in the right lower field and the left lower field. She has no wheezes. She has no rales.  GI: Soft. Bowel sounds are normal. She exhibits no distension. There is no tenderness. There is no guarding.  Genitourinary:        Musculoskeletal: She exhibits no edema or tenderness.  Neurological: She is alert and oriented to person, place, and  time. She displays abnormal reflex. She exhibits abnormal muscle tone. Coordination abnormal.  Flat but speech clear and able to follow commands without difficulty.  BUE 5/5 proximal to distal.  RLE: hip flexion, knee extension 2+/5, ankle dorsi/plantar flexion 3+/5 LLE: 2-/5 proximal to distal  Extensor tone.--2/4,   improved Tight heel cords remain bilaterally.  DTR 3+ b/l LE  Skin: Skin is warm and dry.  Upper thoracic incision with dry scabs under honeycomb dressing still in place  Psychiatric: Her speech is normal and behavior is normal. Judgment and thought content normal. Her affect is blunt. Cognition and memory are normal.   Assessment/Plan: 1. 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 Physiatrist is providing close team supervision and 24 hour management of active medical problems listed below. Physiatrist and rehab team continue to assess barriers to discharge/monitor patient progress toward functional and medical goals.  Function:  Bathing Bathing position Bathing activity did not occur: Refused Position: Sitting EOB  Bathing parts Body parts bathed by patient: Right arm, Left arm, Chest, Abdomen, Right upper leg, Left upper leg, Front perineal area Body parts bathed by helper: Buttocks, Back, Left lower leg, Right lower leg  Bathing assist Assist Level: 2 helpers      Upper Body Dressing/Undressing Upper body dressing   What is the patient wearing?: Pull over shirt/dress     Pull over shirt/dress - Perfomed by patient: Thread/unthread right sleeve, Thread/unthread left sleeve, Put head through opening, Pull shirt over  trunk Pull over shirt/dress - Perfomed by helper: Pull shirt over trunk        Upper body assist Assist Level: Set up, Supervision or verbal cues   Set up : To obtain clothing/put away  Lower Body  Dressing/Undressing Lower body dressing Lower body dressing/undressing activity did not occur: Refused What is the patient wearing?: Pants     Pants- Performed by patient: Thread/unthread right pants leg, Thread/unthread left pants leg, Pull pants up/down Pants- Performed by helper: Pull pants up/down   Non-skid slipper socks- Performed by helper: Don/doff right sock, Don/doff left sock     Shoes - Performed by patient: Don/doff right shoe, Don/doff left shoe Shoes - Performed by helper: Don/doff right shoe, Don/doff left shoe, Fasten right, Fasten left          Lower body assist Assist for lower body dressing: Touching or steadying assistance (Pt > 75%)      Toileting Toileting     Toileting steps completed by helper: Adjust clothing prior to toileting, Performs perineal hygiene, Adjust clothing after toileting    Toileting assist Assist level: Two helpers   Transfers Chair/bed transfer Chair/bed transfer activity did not occur: Safety/medical concerns (Pt unable to successfuly complete attempt) Chair/bed transfer method: Lateral scoot Chair/bed transfer assist level: Maximal assist (Pt 25 - 49%/lift and lower) Chair/bed transfer assistive device: Sliding board     Locomotion Ambulation Ambulation activity did not occur: N/A (WC level)         Wheelchair Wheelchair activity did not occur: Safety/medical concerns Type: Motorized Max wheelchair distance: 150' Assist Level: No help, No cues, assistive device, takes more than reasonable amount of time  Cognition Comprehension Comprehension assist level: Follows complex conversation/direction with extra time/assistive device  Expression Expression assist level: Expresses complex ideas: With no assist  Social Interaction Social Interaction assist level: Interacts appropriately with others - No medications needed.  Problem Solving Problem solving assist level: Solves complex problems: Recognizes & self-corrects  Memory  Memory assist level: Complete Independence: No helper    Medical Problem List and Plan: 1. 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 H/o PE/Anticoagulation: Pharmaceutical: Xarelto 3. Pain Management: Controlled currently on oxycodone prn.  -treat as needed 4. Mood: LCSW to follow for evaluation and support. Ego support provided. She is aware of hard work ahead of her but has good social supports at home.  5. Neuropsych: This patient is capable of making decisions on her own behalf. 6. Skin/Wound Care: Routine pressure relief measures. Continue to monitor thoracic incision for healing as well as skin due to bowel incontinence.  7. Fluids/Electrolytes/Nutrition: Monitor I/O. Appetite has been good so far.   8.Metastatic cancer with T3 pathologic FX with cord compression and new osseous mets T12- L3/S1:   -follow up with endo/onc about treatment plan/schedule 9. Steroid induced Hyperglycemia: Blood sugars improving with decadron taper. Will monitor BID for now.  10. HTN: Continue to monitor BP bid--off prinivil at this time as blood pressures controlled. May need to be resumed as activity level improves  11. Spasticity: Continue baclofen 20 mg 5 X day with valium prn.   -  zanaflex now at '2mg'$  qid, will increase to '2mg'$  tid and '4mg'$  qhs tonight  -i suspect that some of "heavy feeling" is change in muscle tone today----observe for now 12. ABLA: hgb up to 12.7  13. Leucocytosis: Likely steroid induced.   Urine as below  63. 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 H/o depression: Resume home dose Zoloft. 15. Neurogenic bowel : changed to pm bowel program per pt preference  -colace in am  -enema prn 16. Neurogenic bladder:  voiding trial/scheduled i/o caths----keep volumes 300-500cc  -e coli UTI sens to macrobid. Continue for 7  days through today  LOS (Days) 8 A FACE TO FACE EVALUATION WAS PERFORMED  Kinsler Soeder T 07/19/2015 8:48 AM

## 2015-07-20 ENCOUNTER — Inpatient Hospital Stay (HOSPITAL_COMMUNITY): Payer: BLUE CROSS/BLUE SHIELD

## 2015-07-20 ENCOUNTER — Inpatient Hospital Stay (HOSPITAL_COMMUNITY): Payer: BLUE CROSS/BLUE SHIELD | Admitting: Occupational Therapy

## 2015-07-20 LAB — GLUCOSE, CAPILLARY
GLUCOSE-CAPILLARY: 103 mg/dL — AB (ref 65–99)
Glucose-Capillary: 172 mg/dL — ABNORMAL HIGH (ref 65–99)

## 2015-07-20 NOTE — Progress Notes (Signed)
[place] PHYSICAL MEDICINE & REHABILITATION     PROGRESS NOTE    Subjective/Complaints: Feeling well. Spasms better. Still not emptying bladder  ROS: Pt denies fever, rash/itching, headache, blurred or double vision, nausea, vomiting, abdominal pain,   chest pain, shortness of breath, palpitations, dysuria, dizziness,   bleeding, anxiety, or depression   Objective: Vital Signs: Blood pressure 125/73, pulse 53, temperature 98.3 F (36.8 C), temperature source Oral, resp. rate 20, height '5\' 11"'$  (1.803 m), weight 112.492 kg (248 lb), last menstrual period 06/06/2015, SpO2 99 %. No results found. No results for input(s): WBC, HGB, HCT, PLT in the last 72 hours. No results for input(s): NA, K, CL, GLUCOSE, BUN, CREATININE, CALCIUM in the last 72 hours.  Invalid input(s): CO CBG (last 3)   Recent Labs  07/19/15 0626 07/19/15 1650 07/20/15 0607  GLUCAP 102* 166* 103*    Wt Readings from Last 3 Encounters:  07/19/15 112.492 kg (248 lb)  07/06/15 117.3 kg (258 lb 9.6 oz)  04/20/14 127.007 kg (280 lb)    Physical Exam:  Constitutional: She is oriented to person, place, and time. She appears well-developed and well-nourished.  Obese female.  HENT:  Head: Normocephalic and atraumatic.  Mouth/Throat: Oropharynx is clear and moist.  Eyes: Conjunctivae and EOM are normal. Pupils are equal, round, and reactive to light.  Neck: Normal range of motion. Neck supple.  Cardiovascular: Normal rate and regular rhythm.  No murmur heard. Respiratory: Effort normal. No stridor. She has decreased breath sounds in the right lower field and the left lower field. She has no wheezes. She has no rales.  GI: Soft. Bowel sounds are normal. She exhibits no distension. There is no tenderness. There is no guarding.  Genitourinary:        Musculoskeletal: She exhibits no edema or tenderness.  Neurological: She is alert and oriented to person, place, and time. She displays abnormal reflex. She  exhibits abnormal muscle tone. Coordination abnormal.  Flat but speech clear and able to follow commands without difficulty.  BUE 5/5 proximal to distal.  RLE: hip flexion, knee extension 2+/5, ankle dorsi/plantar flexion 3+/5 LLE: 2-/5 proximal to distal  Extensor tone. 2/4,   improved Tight heel cords remain bilaterally.  DTR 3+ b/l LE  Skin: Skin is warm and dry.  Upper thoracic incision clean/sutures/minimal drainage Psychiatric: Her speech is normal and behavior is normal. Judgment and thought content normal. Her affect is blunt. Cognition and memory are normal.   Assessment/Plan: 1. 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 Physiatrist is providing close team supervision and 24 hour management of active medical problems listed below. Physiatrist and rehab team continue to assess barriers to discharge/monitor patient progress toward functional and medical goals.  Function:  Bathing Bathing position Bathing activity did not occur: Refused Position: Sitting EOB  Bathing parts Body parts bathed by patient: Right arm, Left arm, Chest, Abdomen, Right upper leg, Left upper leg Body parts bathed by helper: Right lower leg, Left lower leg, Back  Bathing assist Assist Level: 2 helpers      Upper Body Dressing/Undressing Upper body dressing   What is the patient wearing?: Pull over shirt/dress, Bra Bra - Perfomed by patient: Thread/unthread right bra strap, Thread/unthread left bra strap Bra - Perfomed by helper: Hook/unhook bra (pull down sports bra) Pull over shirt/dress - Perfomed by patient: Thread/unthread right sleeve, Thread/unthread left sleeve, Put head through opening, Pull  shirt over trunk Pull over shirt/dress - Perfomed by helper: Pull shirt over trunk        Upper body assist Assist Level: Set up, Supervision or verbal cues   Set up : To  obtain clothing/put away  Lower Body Dressing/Undressing Lower body dressing Lower body dressing/undressing activity did not occur: Refused What is the patient wearing?: Pants     Pants- Performed by patient: Thread/unthread right pants leg, Thread/unthread left pants leg, Pull pants up/down Pants- Performed by helper: Pull pants up/down   Non-skid slipper socks- Performed by helper: Don/doff right sock, Don/doff left sock     Shoes - Performed by patient: Don/doff right shoe, Don/doff left shoe Shoes - Performed by helper: Don/doff right shoe, Don/doff left shoe, Fasten right, Fasten left          Lower body assist Assist for lower body dressing: Touching or steadying assistance (Pt > 75%)      Toileting Toileting     Toileting steps completed by helper: Adjust clothing prior to toileting, Performs perineal hygiene, Adjust clothing after toileting    Toileting assist Assist level: Two helpers   Transfers Chair/bed transfer Chair/bed transfer activity did not occur: Safety/medical concerns (Pt unable to successfuly complete attempt) Chair/bed transfer method: Lateral scoot Chair/bed transfer assist level: Moderate assist (Pt 50 - 74%/lift or lower) Chair/bed transfer assistive device: Sliding board, Armrests     Locomotion Ambulation Ambulation activity did not occur: N/A (WC level)         Wheelchair Wheelchair activity did not occur: Safety/medical concerns Type: Motorized Max wheelchair distance: 150' Assist Level: No help, No cues, assistive device, takes more than reasonable amount of time  Cognition Comprehension Comprehension assist level: Follows complex conversation/direction with extra time/assistive device  Expression Expression assist level: Expresses complex ideas: With no assist  Social Interaction Social Interaction assist level: Interacts appropriately with others - No medications needed.  Problem Solving Problem solving assist level: Solves complex  problems: Recognizes & self-corrects  Memory Memory assist level: Complete Independence: No helper    Medical Problem List and Plan: 1. 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 H/o PE/Anticoagulation: Pharmaceutical: Xarelto 3. Pain Management: Controlled currently on oxycodone prn.  -treat as needed 4. Mood: LCSW to follow for evaluation and support. Ego support provided. She is aware of hard work ahead of her but has good social supports at home.  5. Neuropsych: This patient is capable of making decisions on her own behalf. 6. Skin/Wound Care: Routine pressure relief measures. Continue to monitor thoracic incision for healing as well as skin due to bowel incontinence.  7. Fluids/Electrolytes/Nutrition: Monitor I/O. Appetite has been good so far.   8.Metastatic cancer with T3 pathologic FX with cord compression and new osseous mets T12- L3/S1:   -follow up with endo/onc about treatment plan/schedule 9. Steroid induced Hyperglycemia: Blood sugars improving with decadron taper. Will monitor BID for now.  10. HTN: Continue to monitor BP bid--off prinivil at this time as blood pressures controlled. May need to be resumed as activity level improves  11. Spasticity: Continue baclofen 20 mg 5 X day with valium prn.   -  zanaflex now at '2mg'$  tid and '4mg'$  qhs ---effective    12. ABLA: hgb up to 12.7  13. Leucocytosis: Likely steroid induced.  afebrile 14. 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 H/o depression: Resume home dose Zoloft. 15.  Neurogenic bowel : changed to pm bowel program per pt preference  -colace in am  -enema prn 16. Neurogenic bladder:  Continue voiding trial/scheduled i/o caths----keep volumes 300-500cc  -e coli UTI sens to macrobid completed  LOS (Days) 9 A FACE TO FACE EVALUATION WAS  PERFORMED  SWARTZ,ZACHARY T 07/20/2015 9:12 AM

## 2015-07-20 NOTE — Plan of Care (Signed)
Problem: RH Bed Mobility Goal: LTG Patient will perform bed mobility with assist (PT) LTG: Patient will perform bed mobility with assistance, with/without cues (PT).  Modified goal due to pt unable to manage BLE and maintain back precautions for sit -> supine. ABG

## 2015-07-20 NOTE — Progress Notes (Signed)
Physical Therapy Session Note  Patient Details  Name: Sarah [name] MRN: [id number] Date of Birth: 08-18-1965  Today's Date: 07/20/2015 PT Individual Time: 1305-1400 PT Individual Time Calculation (min): 55 min   Short Term Goals: Week 2:  PT Short Term Goal 1 (Week 2): Pt will perform consistent transfers with slideboard with mod assist  PT Short Term Goal 2 (Week 2): Pt will be able to perform bed mobility with mod assist for supine <> sit using hosptial bed and AE as needed PT Short Term Goal 3 (Week 2): Pt will be able to tolerate standing with lift equipment at least 2 x a week for tone management, WB through BLE, postural control retraining  Skilled Therapeutic Interventions/Progress Updates:   Started 5 min late due to pt receiving late lunch tray. Session focused on functional slideboard transfers with focus on technique, balance training with weighted medicine ball with rebounder maintaining with close supervision to steady assist, functional UE strengthening to do 4 sets of 5 reps of push-up blocks seated EOM and scapular retraction x 2 sets of 10 reps each for improved posture, and neuro re-ed for trunk elongation/contraction as well as pelvic dissociation while performing functional reaching tasks outside BOS with close supervision. Pt performed level transfers with min assist with cues for head/hips relationship and cues for technique.   Therapy Documentation Precautions:  Precautions Precautions: Back, Fall Precaution Booklet Issued: No Precaution Comments: Reviewed and performed log roll technique for supine<>sit Restrictions Weight Bearing Restrictions: No  Pain: No complaints.    See Function Navigator for Current Functional Status.   Therapy/Group: Individual Therapy  [name], PT, DPT  07/20/2015, 2:59 PM

## 2015-07-20 NOTE — Progress Notes (Signed)
Occupational Therapy Session Note  Patient Details  Name: Sarah [name] MRN: [id number] Date of Birth: 1965-12-31  Today's Date: 07/20/2015 OT Individual Time: 7253-6644 and 1400-1500 OT Individual Time Calculation (min): 60 min and 60 min   Short Term Goals: Week 2:  OT Short Term Goal 1 (Week 2): Pt will transfer to [facility] with assist of one person using sliding board in order to decrease caregiver burden OT Short Term Goal 2 (Week 2): Pt will don pants sitting EOB using lateral leans to pull pants up with supervision and AE PRN OT Short Term Goal 3 (Week 2): Pt will complete bathing/dressing session with only one rest break.  Skilled Therapeutic Interventions/Progress Updates:    Session One: Pt seen for OT ADL bathing/dressing session. PT in supine upon arrival, agreeable to tx session. Leg loops donned in supine and pt able to move B LEs off EOB to come sitting EOB with increased time and VCs for technique. Using hospital bed functions, pt able to come into sitting EOB with close supervision and guarding assist for LEs.  She sat EOB to complete bathing/dressing task, stabilizing self on table and bed rails with one UE support. Bed elevated for feet to tangle and pt able to thread B LEs into pants using reacher and completed lateral leans to R and L x3 to each side to pull pants up. Min A required to get pants up completely. Shoes donned total A. She transferred into power w/c using sliding board with mod A of 1 with +2 available for safety. She completed grooming tasks with set-up at the sink. Pt left sitting up in power w/c at end of session, all needs in reach.   Session Two: Pt seen for OT session focusing on lateral leans in prep for ADL tasks and for UE strengthening and endurance. Pt received sitting up in power w/c with hand off from PT. She completed sliding board transfer with min A to therapy mat. She completed x2 sets of 5 lateral leans to R and repeated activity on L side. Pt  required to laterally lean in order to obtain playing card placed under opposite side leg in simulation of leans for LB dressing/ toileting tasks. Rest breaks required btwn trials and VCs provided for more effective leaning technique. Pt voiced desire to complete UE strengthening activity. She returned to w/c and completed 5 min on SCI FIT on level 1.5. VCs provided for deep breathing technique.  Pt returned to room at end of session and sliding board transfer completed back to bed in prep for nursing to perform cathing. Mod A for management of B LEs to return to supine. Pt left with all needs in reach. NT made aware of pt's position and ready for cathing.  Therapy Documentation Precautions:  Precautions Precautions: Back, Fall Precaution Booklet Issued: No Precaution Comments: Reviewed and performed log roll technique for supine<>sit Restrictions Weight Bearing Restrictions: No Pain: Pain Assessment Pain Assessment: 0-10 Pain Score: 4  Pain Type: Acute pain Pain Location: Back Pain Descriptors / Indicators: Aching Pain Frequency: Intermittent Pain Intervention(s): Repositioned  See Function Navigator for Current Functional Status.   Therapy/Group: Individual Therapy  Lewis, Siddarth Hsiung C 07/20/2015, 7:11 AM

## 2015-07-21 ENCOUNTER — Inpatient Hospital Stay (HOSPITAL_COMMUNITY): Payer: BLUE CROSS/BLUE SHIELD | Admitting: Physical Therapy

## 2015-07-21 LAB — GLUCOSE, CAPILLARY
Glucose-Capillary: 148 mg/dL — ABNORMAL HIGH (ref 65–99)
Glucose-Capillary: 157 mg/dL — ABNORMAL HIGH (ref 65–99)

## 2015-07-21 NOTE — Progress Notes (Signed)
[place] PHYSICAL MEDICINE & REHABILITATION     PROGRESS NOTE    Subjective/Complaints: Still retaining, no issues overnite  ROS: Pt denies fever, rash/itching, headache, blurred or double vision, nausea, vomiting, abdominal pain,   chest pain, shortness of breath, palpitations, dysuria, dizziness,   bleeding, anxiety, or depression   Objective: Vital Signs: Blood pressure 107/63, pulse 57, temperature 98.3 F (36.8 C), temperature source Oral, resp. rate 18, height '5\' 11"'$  (1.803 m), weight 112.492 kg (248 lb), last menstrual period 06/06/2015, SpO2 99 %. No results found. No results for input(s): WBC, HGB, HCT, PLT in the last 72 hours. No results for input(s): NA, K, CL, GLUCOSE, BUN, CREATININE, CALCIUM in the last 72 hours.  Invalid input(s): CO CBG (last 3)   Recent Labs  07/20/15 0607 07/20/15 1925 07/21/15 0631  GLUCAP 103* 172* 148*    Wt Readings from Last 3 Encounters:  07/19/15 112.492 kg (248 lb)  07/06/15 117.3 kg (258 lb 9.6 oz)  04/20/14 127.007 kg (280 lb)    Physical Exam:  Constitutional: She is oriented to person, place, and time. She appears well-developed and well-nourished.  Obese female.  HENT:  Head: Normocephalic and atraumatic.  Mouth/Throat: Oropharynx is clear and moist.  Eyes: Conjunctivae and EOM are normal. Pupils are equal, round, and reactive to light.  Neck: Normal range of motion. Neck supple.  Cardiovascular: Normal rate and regular rhythm.  No murmur heard. Respiratory: Effort normal. No stridor. She has decreased breath sounds in the right lower field and the left lower field. She has no wheezes. She has no rales.  GI: Soft. Bowel sounds are normal. She exhibits no distension. There is no tenderness. There is no guarding.  Genitourinary:        Musculoskeletal: She exhibits no edema or tenderness.  Neurological: She is alert and oriented to person, place, and time. She displays abnormal reflex. She exhibits abnormal  muscle tone. Coordination abnormal.  Flat but speech clear and able to follow commands without difficulty.  BUE 5/5 proximal to distal.  RLE: hip flexion, knee extension 2+/5, ankle dorsi/plantar flexion 3+/5 LLE: 2-/5 proximal to distal  Extensor tone. 2/4,   improved Tight heel cords remain bilaterally.  DTR 3+ b/l LE  Skin: Skin is warm and dry.  Upper thoracic incision clean/sutures/minimal drainage Psychiatric: Her speech is normal and behavior is normal. Judgment and thought content normal. Her affect is blunt. Cognition and memory are normal.   Assessment/Plan: 1. 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 Physiatrist is providing close team supervision and 24 hour management of active medical problems listed below. Physiatrist and rehab team continue to assess barriers to discharge/monitor patient progress toward functional and medical goals.  Function:  Bathing Bathing position Bathing activity did not occur: Refused Position: Sitting EOB  Bathing parts Body parts bathed by patient: Right arm, Left arm, Chest, Abdomen, Right upper leg, Left upper leg Body parts bathed by helper: Right lower leg, Left lower leg, Back  Bathing assist Assist Level: 2 helpers      Upper Body Dressing/Undressing Upper body dressing   What is the patient wearing?: Pull over shirt/dress, Bra Bra - Perfomed by patient: Thread/unthread right bra strap, Thread/unthread left bra strap Bra - Perfomed by helper: Hook/unhook bra (pull down sports bra) Pull over shirt/dress - Perfomed by patient: Thread/unthread right sleeve, Thread/unthread left sleeve, Put head through opening, Pull shirt over trunk  Pull over shirt/dress - Perfomed by helper: Pull shirt over trunk        Upper body assist Assist Level: Set up, Supervision or verbal cues   Set up : To obtain clothing/put  away  Lower Body Dressing/Undressing Lower body dressing Lower body dressing/undressing activity did not occur: Refused What is the patient wearing?: Pants     Pants- Performed by patient: Thread/unthread right pants leg, Thread/unthread left pants leg, Pull pants up/down Pants- Performed by helper: Pull pants up/down   Non-skid slipper socks- Performed by helper: Don/doff right sock, Don/doff left sock     Shoes - Performed by patient: Don/doff right shoe, Don/doff left shoe Shoes - Performed by helper: Don/doff right shoe, Don/doff left shoe, Fasten right, Fasten left          Lower body assist Assist for lower body dressing: Touching or steadying assistance (Pt > 75%)      Toileting Toileting     Toileting steps completed by helper: Adjust clothing prior to toileting, Performs perineal hygiene, Adjust clothing after toileting    Toileting assist Assist level: Two helpers   Transfers Chair/bed transfer Chair/bed transfer activity did not occur: Safety/medical concerns (Pt unable to successfuly complete attempt) Chair/bed transfer method: Lateral scoot Chair/bed transfer assist level: Moderate assist (Pt 50 - 74%/lift or lower) Chair/bed transfer assistive device: Sliding board, Armrests     Locomotion Ambulation Ambulation activity did not occur: N/A (WC level)         Wheelchair Wheelchair activity did not occur: Safety/medical concerns Type: Motorized Max wheelchair distance: 150' Assist Level: No help, No cues, assistive device, takes more than reasonable amount of time  Cognition Comprehension Comprehension assist level: Follows complex conversation/direction with extra time/assistive device  Expression Expression assist level: Expresses complex ideas: With no assist  Social Interaction Social Interaction assist level: Interacts appropriately with others - No medications needed.  Problem Solving Problem solving assist level: Solves complex problems: Recognizes &  self-corrects  Memory Memory assist level: Complete Independence: No helper    Medical Problem List and Plan: 1. 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 H/o PE/Anticoagulation: Pharmaceutical: Xarelto 3. Pain Management: Controlled currently on oxycodone prn.  -treat as needed 4. Mood: LCSW to follow for evaluation and support. Ego support provided. She is aware of hard work ahead of her but has good social supports at home.  5. Neuropsych: This patient is capable of making decisions on her own behalf. 6. Skin/Wound Care: Routine pressure relief measures. Continue to monitor thoracic incision for healing as well as skin due to bowel incontinence.  7. Fluids/Electrolytes/Nutrition: Monitor I/O. Appetite has been good so far.   8.Metastatic cancer with T3 pathologic FX with cord compression and new osseous mets T12- L3/S1:   -follow up with endo/onc about treatment plan/schedule 9. Steroid induced Hyperglycemia: Blood sugars improving with decadron taper. Will monitor BID for now.  10. HTN: Continue to monitor BP bid--off prinivil at this time as blood pressures controlled. May need to be resumed as activity level improves  11. Spasticity: Continue baclofen 20 mg 5 X day with valium prn. Well controlled at present  -  zanaflex now at '2mg'$  tid and '4mg'$  qhs ---effective    12. ABLA: hgb up to 12.7  13. Leucocytosis: Likely steroid induced.  afebrile 14. 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 H/o depression: Resume home dose Zoloft. 15.  Neurogenic bowel : changed to pm bowel program per pt preference  -colace in am  -enema prn 16. Neurogenic bladder:  Urinary retention. Continue voiding trial/scheduled i/o caths----keep volumes 300-500cc  -e coli UTI sens to macrobid completed  LOS (Days) 10 A FACE TO FACE EVALUATION WAS  PERFORMED  [name] 07/21/2015 8:57 AM

## 2015-07-21 NOTE — Progress Notes (Signed)
Physical Therapy Session Note  Patient Details  Name: Sarah [name] MRN: [id number] Date of Birth: 12/27/1965  Today's Date: 07/21/2015 PT Individual Time: 0900-1000 PT Individual Time Calculation (min): 60 min   Short Term Goals: Week 1:  PT Short Term Goal 1 (Week 1): Pt will roll R/L with mod A and safe techniwue without cues PT Short Term Goal 1 - Progress (Week 1): Met PT Short Term Goal 2 (Week 1): Pt will performe supine<>sit with Max A of 1 PT Short Term Goal 2 - Progress (Week 1): Met PT Short Term Goal 3 (Week 1): Pt will perform sliding board transfer with +2 assist  PT Short Term Goal 3 - Progress (Week 1): Met PT Short Term Goal 4 (Week 1): Pt will propel WC x25' with S PT Short Term Goal 4 - Progress (Week 1): Met Week 2:  PT Short Term Goal 1 (Week 2): Pt will perform consistent transfers with slideboard with mod assist  PT Short Term Goal 2 (Week 2): Pt will be able to perform bed mobility with mod assist for supine <> sit using hosptial bed and AE as needed PT Short Term Goal 3 (Week 2): Pt will be able to tolerate standing with lift equipment at least 2 x a week for tone management, WB through BLE, postural control retraining  Skilled Therapeutic Interventions/Progress Updates:    Pt with profound core and LE weakness limiting ability to perform squat pivot at her baseline. Pt has made signifcant gain in core and LE since last week. Pt would continue to benefit from skilled PT services to increase functional mobility.  Therapy Documentation Precautions:  Precautions Precautions: Back, Fall Precaution Booklet Issued: No Precaution Comments: Reviewed and performed log roll technique for supine<>sit Restrictions Weight Bearing Restrictions: No Vital Signs: Therapy Vitals Temp: 98.3 F (36.8 C) Temp Source: Oral Pulse Rate: (!) 57 Resp: 18 BP: 107/63 mmHg Patient Position (if appropriate): Lying Oxygen Therapy SpO2: 99 % O2 Device: Not Delivered Pain: Pain  Assessment Pain Assessment: 0-10 Pain Score: 2  Pain Location: Back Pain Intervention(s): Medication (See eMAR) Mobility:  Mod A slideboard transfers with cues for weight shift, sequencing, and technique Other Treatments:  Pt educated on rehab plan, safety in mobility, progressing mobility, and core stability. Pt performs anterior weights with UE and without UE 2x10 each. Pt performs pre-lift off isometrics x15 in session. Transfers x3 in session performed. Pt performs supine and sitting dynamic balance activities with dual motor tasks including reaching, grasping, LE manipulation, and weight shifting within functional context. Pt performs 1/4 stand x 5' with Max A. Pt performs marching and LAQs AAROM x15.   See Function Navigator for Current Functional Status.   Therapy/Group: Individual Therapy  Monia Pouch 07/21/2015, 9:38 AM

## 2015-07-22 ENCOUNTER — Inpatient Hospital Stay (HOSPITAL_COMMUNITY): Payer: BLUE CROSS/BLUE SHIELD | Admitting: *Deleted

## 2015-07-22 LAB — GLUCOSE, CAPILLARY
GLUCOSE-CAPILLARY: 93 mg/dL (ref 65–99)
GLUCOSE-CAPILLARY: 187 mg/dL — AB (ref 65–99)

## 2015-07-22 NOTE — Progress Notes (Signed)
[place] PHYSICAL MEDICINE & REHABILITATION     PROGRESS NOTE    Subjective/Complaints: Still retaining, no issues overnite  ROS: Pt denies fever, rash/itching, headache, blurred or double vision, nausea, vomiting, abdominal pain,   chest pain, shortness of breath, palpitations, dysuria, dizziness,   bleeding, anxiety, or depression   Objective: Vital Signs: Blood pressure 107/60, pulse 63, temperature 98.7 F (37.1 C), temperature source Oral, resp. rate 19, height '5\' 11"'$  (1.803 m), weight 112.492 kg (248 lb), last menstrual period 06/06/2015, SpO2 100 %. No results found. No results for input(s): WBC, HGB, HCT, PLT in the last 72 hours. No results for input(s): NA, K, CL, GLUCOSE, BUN, CREATININE, CALCIUM in the last 72 hours.  Invalid input(s): CO CBG (last 3)   Recent Labs  07/21/15 0631 07/21/15 1839 07/22/15 0545  GLUCAP 148* 157* 93    Wt Readings from Last 3 Encounters:  07/19/15 112.492 kg (248 lb)  07/06/15 117.3 kg (258 lb 9.6 oz)  04/20/14 127.007 kg (280 lb)    Physical Exam:  Constitutional: She is oriented to person, place, and time. She appears well-developed and well-nourished.  Obese female.  HENT:  Head: Normocephalic and atraumatic.  Mouth/Throat: Oropharynx is clear and moist.  Eyes: Conjunctivae and EOM are normal. Pupils are equal, round, and reactive to light.  Neck: Normal range of motion. Neck supple.  Cardiovascular: Normal rate and regular rhythm.  No murmur heard. Respiratory: Effort normal. No stridor. She has decreased breath sounds in the right lower field and the left lower field. She has no wheezes. She has no rales.  GI: Soft. Bowel sounds are normal. She exhibits no distension. There is no tenderness. There is no guarding.  Genitourinary:        Musculoskeletal: She exhibits no edema or tenderness.  Neurological: She is alert and oriented to person, place, and time. She displays abnormal reflex. She exhibits abnormal  muscle tone. Coordination abnormal.  Flat but speech clear and able to follow commands without difficulty.  BUE 5/5 proximal to distal.  RLE: hip flexion, knee extension 2+/5, ankle dorsi/plantar flexion 3+/5 LLE: 2-/5 proximal to distal  Extensor tone. 2/4,   improved Tight heel cords remain bilaterally.  DTR 3+ b/l LE  Skin: Skin is warm and dry.  Upper thoracic incision clean/sutures/minimal drainage Psychiatric: Her speech is normal and behavior is normal. Judgment and thought content normal. Her affect is blunt. Cognition and memory are normal.   Assessment/Plan: 1. 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 Physiatrist is providing close team supervision and 24 hour management of active medical problems listed below. Physiatrist and rehab team continue to assess barriers to discharge/monitor patient progress toward functional and medical goals.  Function:  Bathing Bathing position Bathing activity did not occur: Refused Position: Sitting EOB  Bathing parts Body parts bathed by patient: Right arm, Left arm, Chest, Abdomen, Right upper leg, Left upper leg Body parts bathed by helper: Right lower leg, Left lower leg, Back  Bathing assist Assist Level: 2 helpers      Upper Body Dressing/Undressing Upper body dressing   What is the patient wearing?: Pull over shirt/dress, Bra Bra - Perfomed by patient: Thread/unthread right bra strap, Thread/unthread left bra strap Bra - Perfomed by helper: Hook/unhook bra (pull down sports bra) Pull over shirt/dress - Perfomed by patient: Thread/unthread right sleeve, Thread/unthread left sleeve, Put head through opening, Pull shirt over trunk  Pull over shirt/dress - Perfomed by helper: Pull shirt over trunk        Upper body assist Assist Level: Set up, Supervision or verbal cues   Set up : To obtain clothing/put  away  Lower Body Dressing/Undressing Lower body dressing Lower body dressing/undressing activity did not occur: Refused What is the patient wearing?: Pants     Pants- Performed by patient: Thread/unthread right pants leg, Thread/unthread left pants leg, Pull pants up/down Pants- Performed by helper: Pull pants up/down   Non-skid slipper socks- Performed by helper: Don/doff right sock, Don/doff left sock     Shoes - Performed by patient: Don/doff right shoe, Don/doff left shoe Shoes - Performed by helper: Don/doff right shoe, Don/doff left shoe, Fasten right, Fasten left          Lower body assist Assist for lower body dressing: Touching or steadying assistance (Pt > 75%)      Toileting Toileting     Toileting steps completed by helper: Adjust clothing prior to toileting, Performs perineal hygiene, Adjust clothing after toileting    Toileting assist Assist level: Two helpers   Transfers Chair/bed transfer Chair/bed transfer activity did not occur: Safety/medical concerns (Pt unable to successfuly complete attempt) Chair/bed transfer method: Lateral scoot Chair/bed transfer assist level: Moderate assist (Pt 50 - 74%/lift or lower) Chair/bed transfer assistive device: Sliding board, Armrests     Locomotion Ambulation Ambulation activity did not occur: N/A (WC level)         Wheelchair Wheelchair activity did not occur: Safety/medical concerns Type: Motorized Max wheelchair distance: 150' Assist Level: No help, No cues, assistive device, takes more than reasonable amount of time  Cognition Comprehension Comprehension assist level: Follows complex conversation/direction with extra time/assistive device  Expression Expression assist level: Expresses complex ideas: With no assist  Social Interaction Social Interaction assist level: Interacts appropriately with others - No medications needed.  Problem Solving Problem solving assist level: Solves complex problems: Recognizes &  self-corrects  Memory Memory assist level: Complete Independence: No helper    Medical Problem List and Plan: 1. 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 H/o PE/Anticoagulation: Pharmaceutical: Xarelto, No signs of bleeding 3. Pain Management: Controlled currently on oxycodone prn.  -treat as needed 4. Mood: LCSW to follow for evaluation and support. Ego support provided. She is aware of hard work ahead of her but has good social supports at home.  5. Neuropsych: This patient is capable of making decisions on her own behalf. 6. Skin/Wound Care: Routine pressure relief measures. Continue to monitor thoracic incision for healing as well as skin due to bowel incontinence.  7. Fluids/Electrolytes/Nutrition: Monitor I/O. Appetite has been good so far.   8.Metastatic cancer with T3 pathologic FX with cord compression and new osseous mets T12- L3/S1:   -follow up with endo/onc about treatment plan/schedule 9. Steroid induced Hyperglycemia: Blood sugars improving with decadron taper. Will monitor BID for now.  CBG (last 3)   Recent Labs  07/21/15 0631 07/21/15 1839 07/22/15 0545  GLUCAP 148* 157* 93    10. HTN: Continue to monitor BP bid--off prinivil at this time as blood pressures controlled. May need to be resumed as activity level improves  11. Spasticity: Continue baclofen 20 mg 5 X day with valium prn. Well controlled at present  -  zanaflex now at '2mg'$  tid and '4mg'$  qhs ---effective  12. ABLA: hgb up to 12.7  13. Leucocytosis: Likely steroid induced.  afebrile 14. 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 H/o depression: Resume home dose Zoloft. 15. Neurogenic bowel : changed to pm bowel program per pt preference  -colace in am  -enema prn 16. Neurogenic bladder:  Urinary  retention. Continue voiding trial/scheduled i/o caths----keep volumes 300-500cc  -e coli UTI sens to macrobid completed, will recheck UA  C and S given reports of mucus in urine per RN  LOS (Days) 11 A FACE TO FACE EVALUATION WAS PERFORMED  KIRSTEINS,ANDREW E 07/22/2015 9:01 AM

## 2015-07-23 ENCOUNTER — Inpatient Hospital Stay (HOSPITAL_COMMUNITY): Payer: BLUE CROSS/BLUE SHIELD

## 2015-07-23 ENCOUNTER — Inpatient Hospital Stay (HOSPITAL_COMMUNITY): Payer: BLUE CROSS/BLUE SHIELD | Admitting: Occupational Therapy

## 2015-07-23 LAB — URINALYSIS, ROUTINE W REFLEX MICROSCOPIC
Bilirubin Urine: NEGATIVE
Glucose, UA: NEGATIVE mg/dL
Hgb urine dipstick: NEGATIVE
KETONES UR: NEGATIVE mg/dL
NITRITE: POSITIVE — AB
PH: 6 (ref 5.0–8.0)
PROTEIN: NEGATIVE mg/dL
Specific Gravity, Urine: 1.011 (ref 1.005–1.030)

## 2015-07-23 LAB — URINE MICROSCOPIC-ADD ON

## 2015-07-23 LAB — GLUCOSE, CAPILLARY
GLUCOSE-CAPILLARY: 171 mg/dL — AB (ref 65–99)
Glucose-Capillary: 137 mg/dL — ABNORMAL HIGH (ref 65–99)

## 2015-07-23 MED ORDER — BETHANECHOL CHLORIDE 10 MG PO TABS
10.0000 mg | ORAL_TABLET | Freq: Three times a day (TID) | ORAL | Status: DC
  Administered 2015-07-23 – 2015-07-24 (×4): 10 mg via ORAL
  Filled 2015-07-23 (×5): qty 1

## 2015-07-23 NOTE — Progress Notes (Signed)
Physical Therapy Session Note  Patient Details  Name: Sarah [name] MRN: [id number] Date of Birth: December 07, 1965  Today's Date: 07/23/2015 PT Individual Time: 1400-1500 PT Individual Time Calculation (min): 60 min  and Today's Date: 07/23/2015 PT Concurrent Time: 1305-1400 PT Concurrent Time Calculation (min): 55 min  Short Term Goals: Week 2:  PT Short Term Goal 1 (Week 2): Pt will perform consistent transfers with slideboard with mod assist  PT Short Term Goal 2 (Week 2): Pt will be able to perform bed mobility with mod assist for supine <> sit using hosptial bed and AE as needed PT Short Term Goal 3 (Week 2): Pt will be able to tolerate standing with lift equipment at least 2 x a week for tone management, WB through BLE, postural control retraining  Skilled Therapeutic Interventions/Progress Updates:    First session: Denies pain. Session focused on seated postural exercises seated in w/c, w/c mobility on unit mod I in power w/c, functional slideboard transfers with min assist overall, bed mobility on flat mat for rolling and supine <> sit, and supine neuro re-ed to BLE for strengthening, motor activation and control, and stretching to tight musculature. Pt requires cues during transfers for head/hips relationship and attention to foot placement. Min assist for rolling using leg loops. Instructed in active assisted, PROM, and use of powder board for gravity eliminated position in sidelying including hip flexion, extension, knee flexion/extension, hip abduction/adduction and ankle pumps x 15 reps each BLE.  Second session: Denies pain. Pt declined practicing BSC transfers as discussed in AM session and requested to work on activities in standing on tilt table. Performed min assist transfer from mat -> power w/c with cues as described above. Using maxi sky, transferred on/off tilt table and pt performing lateral leans and anterior weightshifts independently to place and remove sling. Pt able to  tolerate 60-65 degrees in tilt table while focusing on neuro re-ed to BLE for muscle activation at knee, quad, and glutes while facilitating weightshifting to increase weight on L. Rest breaks as needed. Pt able to maintain tilt table x 20 min.   Therapy Documentation Precautions:  Precautions Precautions: Back, Fall Precaution Booklet Issued: No Precaution Comments: Reviewed and performed log roll technique for supine<>sit Restrictions Weight Bearing Restrictions: No   Pain: Denies pain.     See Function Navigator for Current Functional Status.   Therapy/Group: Individual Therapy  [name], PT, DPT  07/23/2015, 3:26 PM

## 2015-07-23 NOTE — Progress Notes (Signed)
Occupational Therapy Session Note  Patient Details  Name: Sarah [name] MRN: [id number] Date of Birth: October 04, 1965  Today's Date: 07/23/2015 OT Individual Time: 4585-9292 OT Individual Time Calculation (min): 60 min    Short Term Goals: Week 2:  OT Short Term Goal 1 (Week 2): Pt will transfer to [facility] with assist of one person using sliding board in order to decrease caregiver burden OT Short Term Goal 2 (Week 2): Pt will don pants sitting EOB using lateral leans to pull pants up with supervision and AE PRN OT Short Term Goal 3 (Week 2): Pt will complete bathing/dressing session with only one rest break.  Skilled Therapeutic Interventions/Progress Updates:    Pt seen for OT ADL bathing/dressing session. Pt in supine upon arrival, agreeable to tx session. She voiced need for incontiennt brief to be changed, hwoever, stated that nursing had scanned her and she did not need to be cathed. Pt rolled with  Min A for brief to be donned and hygiene completed total A. Instructed pt on log rolling technique and how to direct caregivers with assisting with bed mobiilty.  She transferred to EOB with min A for management of L LE and using check pad to assist with hip mobility. She sat EOB to complete bathing task with supervision. Pt given and educated regarding use of LH sponge, and while steadying with one UE on EOB used other UE to use LH sponge to bathe. Pt able to shave part of way down legs, therapist assisted for thoroughness.  Bed elevated and pt able to thread B LEs into pants using reacher. She completed lateral leans to pull pants up. She demonstrated improved sitting balance and activity tolerance today during bathing/dressing session.  She completed sliding board transfer to power w/c with min A and assist to place board. She completed grooming tasks seated in w/cat sink with set-up assist. Pt left sitting up in w/c at end of session, all needs in reach.  Encouraged pt to have nursing get her to [facility]  as part of bowel program and to try to urinate while up on [facility] as pt beginning to have return in bladder function.   Therapy Documentation Precautions:  Precautions Precautions: Back, Fall Precaution Booklet Issued: No Precaution Comments: Reviewed and performed log roll technique for supine<>sit Restrictions Weight Bearing Restrictions: No Pain:   Voiced some discomfort in back, RN aware, repositioned  See Function Navigator for Current Functional Status.   Therapy/Group: Individual Therapy  Lewis, Hatley Henegar C 07/23/2015, 7:11 AM

## 2015-07-23 NOTE — Progress Notes (Signed)
Physical Therapy Session Note  Patient Details  Name: Sarah [name] MRN: [id number] Date of Birth: [birth year]  Today's Date: 07/23/2015 PT Individual Time: 1000-1100 PT Individual Time Calculation (min): 60 min   Short Term Goals: Week 2:  PT Short Term Goal 1 (Week 2): Pt will perform consistent transfers with slideboard with mod assist  PT Short Term Goal 2 (Week 2): Pt will be able to perform bed mobility with mod assist for supine <> sit using hosptial bed and AE as needed PT Short Term Goal 3 (Week 2): Pt will be able to tolerate standing with lift equipment at least 2 x a week for tone management, WB through BLE, postural control retraining  Skilled Therapeutic Interventions/Progress Updates:    Session focused on d/c planning discussion (goals, equipment, fam ed, car transfers, etc), simulated car transfer training with slideboard, and bed mobility and transfers to ADL apartment bed to simulate home environment. Pt performed transfers to/from bed with min assist with focus on having patient assist with board placement (PT holding LE) and removal and directing therapist on how to assist patient to simulate instructing caregiver. Pt expressed main concerns at this time are with [facility] transfers, planning to practice this in later PM session. Pt using leg loops for LE management and requires cues and assist at times. Pt able to activate L ankle DF and PF as well as initiate LAQ against gravity on L. Continue with PT POC.   Therapy Documentation Precautions:  Precautions Precautions: Back, Fall Precaution Booklet Issued: No Precaution Comments: Reviewed and performed log roll technique for supine<>sit Restrictions Weight Bearing Restrictions: No   Pain:  Premedicated for back pain.       See Function Navigator for Current Functional Status.   Therapy/Group: Individual Therapy  [name], PT, DPT  07/23/2015, 12:04 PM

## 2015-07-23 NOTE — Progress Notes (Signed)
PHYSICAL MEDICINE & REHABILITATION     PROGRESS NOTE    Subjective/Complaints: Still retaining urine, positive incontinence. Legs moving more. Spasms better  ROS: Pt denies fever, rash/itching, headache, blurred or double vision, nausea, vomiting, abdominal pain,   chest pain, shortness of breath, palpitations, dysuria, dizziness,   bleeding, anxiety, or depression   Objective: Vital Signs: Blood pressure 110/75, pulse 55, temperature 97.9 F (36.6 C), temperature source Oral, resp. rate 17, height '5\' 11"'$  (1.803 m), weight 112.492 kg (248 lb), last menstrual period 06/06/2015, SpO2 100 %. No results found. No results for input(s): WBC, HGB, HCT, PLT in the last 72 hours. No results for input(s): NA, K, CL, GLUCOSE, BUN, CREATININE, CALCIUM in the last 72 hours.  Invalid input(s): CO CBG (last 3)   Recent Labs  07/22/15 0545 07/22/15 1827 07/23/15 0616  GLUCAP 93 187* 137*    Wt Readings from Last 3 Encounters:  07/19/15 112.492 kg (248 lb)  07/06/15 117.3 kg (258 lb 9.6 oz)  04/20/14 127.007 kg (280 lb)    Physical Exam:  Constitutional: She is oriented to person, place, and time. She appears well-developed and well-nourished.  Obese female.  HENT:  Head: Normocephalic and atraumatic.  Mouth/Throat: Oropharynx is clear and moist.  Eyes: Conjunctivae and EOM are normal. Pupils are equal, round, and reactive to light.  Neck: Normal range of motion. Neck supple.  Cardiovascular: Normal rate and regular rhythm.  No murmur heard. Respiratory: Effort normal. No stridor. She has decreased breath sounds in the right lower field and the left lower field. She has no wheezes. She has no rales.  GI: Soft. Bowel sounds are normal. She exhibits no distension. There is no tenderness. There is no guarding.  Genitourinary:        Musculoskeletal: She exhibits no edema or tenderness.  Neurological: She is alert and oriented to person, place, and time. She displays  abnormal reflex. She exhibits abnormal muscle tone. Coordination abnormal.  Flat but speech clear and able to follow commands without difficulty.  BUE 5/5 proximal to distal.  RLE: hip flexion, knee extension 2+/5, ankle dorsi/plantar flexion 3+/5 LLE: 2-/5 proximal to distal  Extensor tone. 1/4,   improved Tight heel cords remain bilaterally.  DTR 3+ b/l LE  Skin: Skin is warm and dry.  Upper thoracic incision clean/sutures/minimal drainage Psychiatric: Her speech is normal and behavior is normal. Judgment and thought content normal. Her affect is blunt. Cognition and memory are normal.   Assessment/Plan: 1. 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 Physiatrist is providing close team supervision and 24 hour management of active medical problems listed below. Physiatrist and rehab team continue to assess barriers to discharge/monitor patient progress toward functional and medical goals.  Function:  Bathing Bathing position Bathing activity did not occur: Refused Position: Sitting EOB  Bathing parts Body parts bathed by patient: Right arm, Left arm, Chest, Abdomen, Right upper leg, Left upper leg, Right lower leg, Left lower leg Body parts bathed by helper: Back  Bathing assist Assist Level: 2 helpers      Upper Body Dressing/Undressing Upper body dressing   What is the patient wearing?: Pull over shirt/dress, Bra Bra - Perfomed by patient: Thread/unthread right bra strap, Thread/unthread left bra strap Bra - Perfomed by helper: Hook/unhook bra (pull down sports bra) Pull over shirt/dress - Perfomed by patient: Thread/unthread right sleeve, Thread/unthread left sleeve, Put head through  opening, Pull shirt over trunk Pull over shirt/dress - Perfomed by helper: Pull shirt over trunk        Upper body assist Assist Level: Set up, Supervision or verbal  cues   Set up : To obtain clothing/put away  Lower Body Dressing/Undressing Lower body dressing Lower body dressing/undressing activity did not occur: Refused What is the patient wearing?: Pants, Shoes     Pants- Performed by patient: Thread/unthread right pants leg, Thread/unthread left pants leg, Pull pants up/down Pants- Performed by helper: Pull pants up/down   Non-skid slipper socks- Performed by helper: Don/doff right sock, Don/doff left sock     Shoes - Performed by patient: Don/doff right shoe, Don/doff left shoe Shoes - Performed by helper: Don/doff right shoe, Don/doff left shoe, Fasten right, Fasten left          Lower body assist Assist for lower body dressing: Touching or steadying assistance (Pt > 75%)      Toileting Toileting Toileting activity did not occur: No continent bowel/bladder event   Toileting steps completed by helper: Adjust clothing prior to toileting, Performs perineal hygiene, Adjust clothing after toileting    Toileting assist Assist level: Two helpers   Transfers Chair/bed transfer Chair/bed transfer activity did not occur: Safety/medical concerns (Pt unable to successfuly complete attempt) Chair/bed transfer method: Lateral scoot Chair/bed transfer assist level: Touching or steadying assistance (Pt > 75%) Chair/bed transfer assistive device: Sliding board, Armrests     Locomotion Ambulation Ambulation activity did not occur: N/A (WC level)         Wheelchair Wheelchair activity did not occur: Safety/medical concerns Type: Motorized Max wheelchair distance: 150' Assist Level: No help, No cues, assistive device, takes more than reasonable amount of time  Cognition Comprehension Comprehension assist level: Follows complex conversation/direction with extra time/assistive device  Expression Expression assist level: Expresses complex ideas: With no assist  Social Interaction Social Interaction assist level: Interacts appropriately with  others - No medications needed.  Problem Solving Problem solving assist level: Solves complex problems: Recognizes & self-corrects  Memory Memory assist level: Complete Independence: No helper    Medical Problem List and Plan: 1. 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 H/o PE/Anticoagulation: Pharmaceutical: Xarelto, No signs of bleeding 3. Pain Management: Controlled currently on oxycodone prn.  -treat as needed 4. Mood: LCSW to follow for evaluation and support. Ego support provided. She is aware of hard work ahead of her but has good social supports at home.  5. Neuropsych: This patient is capable of making decisions on her own behalf. 6. Skin/Wound Care: Routine pressure relief measures. Continue to monitor thoracic incision for healing as well as skin due to bowel incontinence.  7. Fluids/Electrolytes/Nutrition: Monitor I/O. Appetite has been good so far.   8.Metastatic cancer with T3 pathologic FX with cord compression and new osseous mets T12- L3/S1:   -follow up with endo/onc about treatment plan/schedule 9. Steroid induced Hyperglycemia: Blood sugars improving with decadron taper. Will monitor BID for now.  CBG (last 3)   Recent Labs  07/22/15 0545 07/22/15 1827 07/23/15 0616  GLUCAP 93 187* 137*    10. HTN: Continue to monitor BP bid--off prinivil at this time as blood pressures controlled. May need to be resumed as activity level improves  11. Spasticity: Continue baclofen 20 mg 5 X day with valium prn. Well controlled at present  -  zanaflex now at '2mg'$  tid and '4mg'$  qhs ---effective    12. ABLA: hgb up to 12.7  13. Leucocytosis: Likely steroid induced.  afebrile 14. Hypothyroid s/p thyroidectomy for hurtle cell thyroid cancer:  59. H/o depression: Resume home dose Zoloft. 15. Neurogenic  bowel : changed to pm bowel program per pt preference  -colace in am  -enema prn 16. Neurogenic bladder:  Urinary retention. Continue voiding trial/scheduled i/o caths----keep volumes 300-500cc  -e coli UTI sens to macrobid completed. Repeat UA suspicious, culture pending  -will add urecholine TID  LOS (Days) 12 A FACE TO FACE EVALUATION WAS PERFORMED  Alexsia Klindt T 07/23/2015 9:14 AM

## 2015-07-24 ENCOUNTER — Inpatient Hospital Stay (HOSPITAL_COMMUNITY): Payer: BLUE CROSS/BLUE SHIELD | Admitting: Occupational Therapy

## 2015-07-24 ENCOUNTER — Inpatient Hospital Stay (HOSPITAL_COMMUNITY): Payer: BLUE CROSS/BLUE SHIELD | Admitting: Physical Therapy

## 2015-07-24 ENCOUNTER — Inpatient Hospital Stay (HOSPITAL_COMMUNITY): Payer: BLUE CROSS/BLUE SHIELD | Admitting: *Deleted

## 2015-07-24 LAB — GLUCOSE, CAPILLARY
GLUCOSE-CAPILLARY: 153 mg/dL — AB (ref 65–99)
GLUCOSE-CAPILLARY: 110 mg/dL — AB (ref 65–99)
Glucose-Capillary: 252 mg/dL — ABNORMAL HIGH (ref 65–99)

## 2015-07-24 MED ORDER — BETHANECHOL CHLORIDE 25 MG PO TABS
25.0000 mg | ORAL_TABLET | Freq: Three times a day (TID) | ORAL | Status: DC
  Administered 2015-07-24 – 2015-08-01 (×24): 25 mg via ORAL
  Filled 2015-07-24 (×25): qty 1

## 2015-07-24 NOTE — Progress Notes (Signed)
Physical Therapy Session Note  Patient Details  Name: Sarah [name] MRN: [id number] Date of Birth: [birth year]  Today's Date: 07/24/2015 PT Individual Time: 1300-1415 PT Individual Time Calculation (min): 75 min   Short Term Goals: Week 2:  PT Short Term Goal 1 (Week 2): Pt will perform consistent transfers with slideboard with mod assist  PT Short Term Goal 2 (Week 2): Pt will be able to perform bed mobility with mod assist for supine <> sit using hosptial bed and AE as needed PT Short Term Goal 3 (Week 2): Pt will be able to tolerate standing with lift equipment at least 2 x a week for tone management, WB through BLE, postural control retraining  Skilled Therapeutic Interventions/Progress Updates:   Pt received in power w/c after lunch.  Pt reporting that at 2 she will need to get onto the [facility] and attempt voiding again.  Pt performed w/c mobility to gym Mod I.  Pt provided with handout of STOMPS (strengthening and optimal positioning for painful shoulders) HEP to focus on stretching of neck and anterior chest muscles and strengthening of shoulder and upper back muscles for functional transfers and posture.  Pt educated on all exercises and use of orange theraband for resistance with pt return demonstrating all exercises.  Continued pressure relief education with pt with therapist demonstrating use of forward lean on table to relieve pressure on sacrum and lateral leans for ischial relief; pt return demonstrated each.  Returned to room and pt set up next to [facility].  Performed transfers w/c <> BSC laterally with slideboard and min-mod A with feet propped on 2" step.  Performed lateral leans with min A to doff pants with pt able to doff pants but therapist removing brief.  Pt able to void on [facility] and perform hygiene.  Required +2 assistance for placement of brief while pt performed lateral leans but pt able to pull pants up on each side with some assistance to pull up in back.  Pt able to verbalize full set  up and sequence mod I.  Returned to w/c and pt left in w/c with all items within reach.  Therapy Documentation Precautions:  Precautions Precautions: Back, Fall Precaution Booklet Issued: No Precaution Comments: Reviewed and performed log roll technique for supine<>sit Restrictions Weight Bearing Restrictions: No Pain:  No c/o pain  See Function Navigator for Current Functional Status.   Therapy/Group: Individual Therapy  Raylene Everts [facility] 07/24/2015, 4:14 PM

## 2015-07-24 NOTE — Progress Notes (Signed)
Social Work Patient ID: Sarah [name], female   DOB: [birth year], 50 y.o.   MRN: [id number]  Have reviewed team conference with pt, sister and mother.  All pleased with progress and continue to plan for 5/10 d/c.  Sister to come in over the next few days to go through therapies.  No concerns at this point.  Have a call in to [place] to check status of manual w/c.  Will keep team posted on this.  Tyshawna Alarid, LCSW

## 2015-07-24 NOTE — Progress Notes (Signed)
Physical Therapy Session Note  Patient Details  Name: Sarah [name] MRN: [id number] Date of Birth: [birth year]  Today's Date: 07/24/2015 PT Individual Time: 1533-1600 PT Individual Time Calculation (min): 27 min   Short Term Goals: Week 2:  PT Short Term Goal 1 (Week 2): Pt will perform consistent transfers with slideboard with mod assist  PT Short Term Goal 2 (Week 2): Pt will be able to perform bed mobility with mod assist for supine <> sit using hosptial bed and AE as needed PT Short Term Goal 3 (Week 2): Pt will be able to tolerate standing with lift equipment at least 2 x a week for tone management, WB through BLE, postural control retraining  Skilled Therapeutic Interventions/Progress Updates:    Pt received in motorized w/c, with family present, & agreeable to PT; pt denied c/o pain. Pt steered w/c room>ortho gym with mod I. Pt able to set w/c up at car with cuing to position leg rests first but required total A to do so. PT placed sliding board total A & pt able to scoot w/c>bed with steady A fade to close supervision, but when getting close to car pt required mod A to transfer BLE into car due to elevated floor board. PT also cued pt to scoot L more before transferring BLE in car. Pt then transferred out of car via sliding board in same manner as noted before, with mod A to transfer BLE out of car. Pt able to negotiate ramp in w/c with mod I & appropriately adjusted seat & w/c. In room pt performed PNF diagonal exercises with orange theraband for 2 sets of 10 reps with BUE. PT Provided cuing for technique & pt able to demonstrate with fair carryover. Pt also performed scapular retractions. At end of session pt left in w/c with all needs within reach & family present.   Therapy Documentation Precautions:  Precautions Precautions: Back, Fall Precaution Booklet Issued: No Precaution Comments: Reviewed and performed log roll technique for supine<>sit Restrictions Weight Bearing  Restrictions: No Pain: Pain Assessment Pain Assessment: No/denies pain   See Function Navigator for Current Functional Status.   Therapy/Group: Individual Therapy  Waunita Schooner 07/24/2015, 12:59 PM

## 2015-07-24 NOTE — Patient Care Conference (Signed)
Inpatient RehabilitationTeam Conference and Plan of Care Update Date: 07/24/2015   Time: 2:10 PM    Patient Name: Sarah [name]      Medical Record Number: [id number]  Date of Birth: February 04, 1966 Sex: Female         Room/Bed: 4W19C/4W19C-01 Payor Info: Payor: [place] / Plan: BCBS OTHER / Product Type: *No Product type* /    Admitting Diagnosis: T3 Fx  tumor resection  Admit Date/Time:  07/11/2015  4:48 PM Admission Comments: No comment available   Primary Diagnosis:  Spastic paraparesis (HCC) Principal Problem: Spastic paraparesis ([place])  Patient Active Problem List   Diagnosis Date Noted  . 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 Spastic paraparesis ([place])   . Neurogenic bladder   . History of pulmonary embolism   . Post-operative pain   . Metastatic cancer ([place])   . Steroid-induced hyperglycemia   . Benign essential HTN   . Acute blood loss anemia   . Leukocytosis   . Depression   . Obstipation   . Thoracic spine tumor 07/06/2015  . 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 ([place]) 07/06/2015  . Cord compression ([place])   . Encounter for intubation   . Lower extremity weakness   . Respiratory failure ([place])   . Postoperative hypothyroidism   . Peripheral edema 06/07/2015  . Hypothyroidism, postop 03/08/2015  . 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 Spastic paraplegia ([place]) 09/19/2014  . Dysuria 09/04/2014  . Hypertension 05/01/2014  . Ingrown right big toenail 05/01/2014  . Hypokalemia 05/01/2014  . GERD (gastroesophageal reflux disease) 02/14/2014  . Dysphagia, pharyngoesophageal phase 02/06/2014  . Type 2 diabetes mellitus without complication ([place]) 96/78/9381  . Constipation   . Neurogenic bowel   . 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 ([place]) 01/20/2014  . Metastatic cancer to spine ([place]) 01/19/2014  . Rotator cuff tendonitis   . Acute postoperative respiratory failure ([place]) 01/09/2014  . 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 Hurthle  cell carcinoma of thyroid ([place]) 12/29/2013  . Leg weakness, bilateral 12/13/2013    Expected Discharge Date: Expected Discharge Date: 08/01/15  Team Members Present: Physician leading conference: Dr. Alger Simons Social Worker Present: Lennart Pall, LCSW Nurse Present: Dorien Chihuahua, RN PT Present: Canary Brim, PT OT Present: Napoleon Form, OT SLP Present: Weston Anna, SLP PPS Coordinator present : Daiva Nakayama, RN, CRRN     Current Status/Progress Goal Weekly Team Focus  Medical   some neurological progress, spasticity better control. foley out but not emptying on her own except for intermittently. pain better  improve bladder emptying  voiding trial, discus plan with [facility] regarding ctx   Bowel/Bladder   Requiring I&O cath q6-8 hours, voiding/leaking inbetween small amounts; bisocodyl suppository q8pm cont of bowel LBM 5/1  able to void without cath  Continue to monitor pt's voids as urecholine was started   Swallow/Nutrition/ Hydration             ADL's   Min-mod A sliding board transfers; Supervision- min A bathing/dressing from EOB; max A toileting  Min A overall  Toileting tasks; toilet transfers; family training; functional transfers; d/c planning   Mobility   min to mod assist for transfers and bed mobility; mod I w/c mobility in power wlc, lift equipment for standing  min assist overall w/c level  endurance, strengthening, neuro re-ed, standing tolerance, transfers, fam ed, d/c planning    Communication             Safety/Cognition/ Behavioral  Observations            Pain   occasional pain managed with tylenol or perc  < 3 on 0-10 pain scale  Assess pain and treat as needed   Skin   Surgical incision to back, hemorroids  Remain free of new breakdown and infection  Monitor skin qshift    Rehab Goals Patient on target to meet rehab goals: Yes *See Care Plan and progress notes for long and short-term goals.  Barriers to Discharge: size, deficits    Possible  Resolutions to Barriers:  see prior. continjue with current plan    Discharge Planning/Teaching Needs:  Home with mother and sister to provide 24/7 assistance as they were PTA  planning to review with sister prior to d/c next week   Team Discussion:  Slightly improved (decreased) tone;  Foley out but may not be successful in being able to keep out for d/c.  Doing very well with PT/OT and on track to meet min assist goals.  She will needs assist with LEs and OT continues to work on bsc transfers. Need to have sister in for education as there may be some different assist needs.  SW to follow up with [facility] about status of manual w/c.  Revisions to Treatment Plan:  None   Continued Need for Acute Rehabilitation Level of Care: The patient requires daily medical management by a physician with specialized training in physical medicine and rehabilitation for the following conditions: Daily direction of a multidisciplinary physical rehabilitation program to ensure safe treatment while eliciting the highest outcome that is of practical value to the patient.: Yes Daily medical management of patient stability for increased activity during participation in an intensive rehabilitation regime.: Yes Daily analysis of laboratory values and/or radiology reports with any subsequent need for medication adjustment of medical intervention for : Post surgical problems;Neurological problems;Pulmonary problems  [name], [place] 07/24/2015, 4:23 PM

## 2015-07-24 NOTE — Progress Notes (Signed)
Physical Therapy Session Note  Patient Details  Name: Sarah [name] MRN: [id number] Date of Birth: 13-May-1965  Today's Date: 07/24/2015 PT Individual Time: 1000-1100 PT Individual Time Calculation (min): 60 min   Short Term Goals: Week 2:  PT Short Term Goal 1 (Week 2): Pt will perform consistent transfers with slideboard with mod assist  PT Short Term Goal 2 (Week 2): Pt will be able to perform bed mobility with mod assist for supine <> sit using hosptial bed and AE as needed PT Short Term Goal 3 (Week 2): Pt will be able to tolerate standing with lift equipment at least 2 x a week for tone management, WB through BLE, postural control retraining  Skilled Therapeutic Interventions/Progress Updates:    Session focused on education and discussion of goals and d/c date, neuro re-ed for BLE strengthening and motor control in seated position, and functional BSC <> power w/c slideboard transfers. Pt instructed in seated ankle pumps, LAQ, marches, and isometric hip abduction/adduction squeezes x 10 reps each BLE with focus on controlling and sustaining movement. +2 for slideboard transfers (mod assist going to [facility] mod to max going back to w/c (due to clothing getting hung on slideboard) with cues for technique and hand placement. Pt able to perform lateral leans for clothing management (able to get pants down, but required pants back up). End of session left up in w/c with all needs in reach.  Therapy Documentation Precautions:  Precautions Precautions: Back, Fall Precaution Booklet Issued: No Precaution Comments: Reviewed and performed log roll technique for supine<>sit Restrictions Weight Bearing Restrictions: No  Pain: Premedicated at start of session for back pain. 4/10   See Function Navigator for Current Functional Status.   Therapy/Group: Individual Therapy and Co-Treatment with TR  [name], PT, DPT  07/24/2015, 11:40 AM

## 2015-07-24 NOTE — Progress Notes (Signed)
Recreational Therapy Session Note  Patient Details  Name: Sarah [name] MRN: [id number] Date of Birth: 1965/08/05 Today's Date: 07/24/2015  Pain: 4/10 back pain, premedicated Skilled Therapeutic Interventions/Progress Updates: Pt requesting to use the [facility] to attempt to empty bladder.  Pt required +2 assist- Mod assist for slide board transfer from power w/c to [facility] and mod-max assist from [facility] to w/c due to clothing getting hung on sliding board during transfer.  Pt performed lateral leans for clothing management-pt able to get pants down but needed assistance to get pants up.  Pt able to void 25cc.  Pt required instructional cues for hand placement and technique during transfers.  Therapy/Group: Co-Treatment  Forney Kleinpeter 07/24/2015, 3:39 PM

## 2015-07-24 NOTE — Progress Notes (Signed)
[facility] PHYSICAL MEDICINE & REHABILITATION     PROGRESS NOTE    Subjective/Complaints: Feeling well. Pain controlled with meds. Having some urge to void and some spontaneous void  ROS: Pt denies fever, rash/itching, headache, blurred or double vision, nausea, vomiting, abdominal pain,   chest pain, shortness of breath, palpitations, dysuria, dizziness,   bleeding, anxiety, or depression   Objective: Vital Signs: Blood pressure 104/75, pulse 58, temperature 98.2 F (36.8 C), temperature source Oral, resp. rate 17, height '5\' 11"'$  (1.803 m), weight 112.492 kg (248 lb), last menstrual period 06/06/2015, SpO2 100 %. No results found. No results for input(s): WBC, HGB, HCT, PLT in the last 72 hours. No results for input(s): NA, K, CL, GLUCOSE, BUN, CREATININE, CALCIUM in the last 72 hours.  Invalid input(s): CO CBG (last 3)   Recent Labs  07/23/15 1814 07/23/15 2201 07/24/15 0629  GLUCAP 171* 153* 110*    Wt Readings from Last 3 Encounters:  07/19/15 112.492 kg (248 lb)  07/06/15 117.3 kg (258 lb 9.6 oz)  04/20/14 127.007 kg (280 lb)    Physical Exam:  Constitutional: She is oriented to person, place, and time. She appears well-developed and well-nourished.  Obese female.  HENT:  Head: Normocephalic and atraumatic.  Mouth/Throat: Oropharynx is clear and moist.  Eyes: Conjunctivae and EOM are normal. Pupils are equal, round, and reactive to light.  Neck: Normal range of motion. Neck supple.  Cardiovascular: Normal rate and regular rhythm.  No murmur heard. Respiratory: Effort normal. No stridor. She has decreased breath sounds in the right lower field and the left lower field. She has no wheezes. She has no rales.  GI: Soft. Bowel sounds are normal. She exhibits no distension. There is no tenderness. There is no guarding.  Genitourinary:        Musculoskeletal: She exhibits no edema or tenderness.  Neurological: She is alert and oriented to person, place, and time.  She displays abnormal reflex. She exhibits abnormal muscle tone. Coordination abnormal.  Flat but speech clear and able to follow commands without difficulty.  BUE 5/5 proximal to distal.  RLE: hip flexion, knee extension 2+/5, ankle dorsi/plantar flexion 3+/5 LLE: 2-/5 proximal to distal  Extensor tone. 1/4,   improved Tight heel cords remain bilaterally.  DTR 3+ b/l LE  Skin: Skin is warm and dry.  Upper thoracic incision clean/sutures/minimal drainage Psychiatric: Her speech is normal and behavior is normal. Judgment and thought content normal. Her affect is blunt. Cognition and memory are normal.   Assessment/Plan: 1. 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 Physiatrist is providing close team supervision and 24 hour management of active medical problems listed below. Physiatrist and rehab team continue to assess barriers to discharge/monitor patient progress toward functional and medical goals.  Function:  Bathing Bathing position Bathing activity did not occur: Refused Position: Sitting EOB  Bathing parts Body parts bathed by patient: Right arm, Left arm, Chest, Abdomen, Right upper leg, Left upper leg, Right lower leg, Left lower leg Body parts bathed by helper: Back  Bathing assist Assist Level: 2 helpers      Upper Body Dressing/Undressing Upper body dressing   What is the patient wearing?: Pull over shirt/dress, Bra Bra - Perfomed by patient: Thread/unthread right bra strap, Thread/unthread left bra strap Bra - Perfomed by helper: Hook/unhook bra (pull down sports bra) Pull over shirt/dress - Perfomed by patient: Thread/unthread right sleeve, Thread/unthread  left sleeve, Put head through opening, Pull shirt over trunk Pull over shirt/dress - Perfomed by helper: Pull shirt over trunk        Upper body assist Assist Level: Set up, Supervision  or verbal cues   Set up : To obtain clothing/put away  Lower Body Dressing/Undressing Lower body dressing Lower body dressing/undressing activity did not occur: Refused What is the patient wearing?: Pants, Shoes     Pants- Performed by patient: Thread/unthread right pants leg, Thread/unthread left pants leg, Pull pants up/down Pants- Performed by helper: Pull pants up/down   Non-skid slipper socks- Performed by helper: Don/doff right sock, Don/doff left sock     Shoes - Performed by patient: Don/doff right shoe, Don/doff left shoe Shoes - Performed by helper: Don/doff right shoe, Don/doff left shoe, Fasten right, Fasten left          Lower body assist Assist for lower body dressing: Touching or steadying assistance (Pt > 75%)      Toileting Toileting Toileting activity did not occur: No continent bowel/bladder event   Toileting steps completed by helper: Adjust clothing prior to toileting, Performs perineal hygiene, Adjust clothing after toileting    Toileting assist Assist level: Two helpers   Transfers Chair/bed transfer Chair/bed transfer activity did not occur: Safety/medical concerns (Pt unable to successfuly complete attempt) Chair/bed transfer method: Lateral scoot Chair/bed transfer assist level: Touching or steadying assistance (Pt > 75%) Chair/bed transfer assistive device: Sliding board, Armrests     Locomotion Ambulation Ambulation activity did not occur: N/A (WC level)         Wheelchair Wheelchair activity did not occur: Safety/medical concerns Type: Motorized Max wheelchair distance: 250' Assist Level: No help, No cues, assistive device, takes more than reasonable amount of time  Cognition Comprehension Comprehension assist level: Follows complex conversation/direction with no assist  Expression Expression assist level: Expresses complex ideas: With no assist  Social Interaction Social Interaction assist level: Interacts appropriately with others with  medication or extra time (anti-anxiety, antidepressant).  Problem Solving Problem solving assist level: Solves complex problems: Recognizes & self-corrects  Memory Memory assist level: Complete Independence: No helper    Medical Problem List and Plan: 1. 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 H/o PE/Anticoagulation: Pharmaceutical: Xarelto, No signs of bleeding 3. Pain Management: Controlled currently on oxycodone prn.  -treat as needed 4. Mood: LCSW to follow for evaluation and support. Ego support provided. She is aware of hard work ahead of her but has good social supports at home.  5. Neuropsych: This patient is capable of making decisions on her own behalf. 6. Skin/Wound Care: Routine pressure relief measures. Continue to monitor thoracic incision for healing as well as skin due to bowel incontinence.  7. Fluids/Electrolytes/Nutrition: Monitor I/O. Appetite has been good so far.   8.Metastatic cancer with T3 pathologic FX with cord compression and new osseous mets T12- L3/S1:   -follow up with endo/onc about treatment plan/schedule 9. Steroid induced Hyperglycemia: Blood sugars improving with decadron taper. Will monitor BID for now.  CBG (last 3)   Recent Labs  07/23/15 1814 07/23/15 2201 07/24/15 0629  GLUCAP 171* 153* 110*    10. HTN: Continue to monitor BP bid--off prinivil at this time as blood pressures controlled. May need to be resumed as activity level improves  11. Spasticity: Continue baclofen 20 mg 5 X day with valium prn. Well controlled at present  -  zanaflex now at '2mg'$  tid and '4mg'$  qhs ---effective    12. ABLA: hgb up to 12.7  13. Leucocytosis: Likely steroid induced.  afebrile 14. 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 H/o depression: Resume home dose Zoloft. 15. Neurogenic bowel : changed  to pm bowel program per pt preference  -colace in am  -enema prn 16. Neurogenic bladder:  Urinary retention. Continue voiding trial/scheduled i/o caths----keep volumes 300-500cc  -e coli UTI sens to macrobid completed. Repeat UA suspicious, culture still pending  -will increase urecholine to '25mg'$  TID, timed voids  LOS (Days) 13 A FACE TO FACE EVALUATION WAS PERFORMED  Sariya Trickey T 07/24/2015 9:12 AM

## 2015-07-24 NOTE — Progress Notes (Signed)
Occupational Therapy Session Note  Patient Details  Name: Sarah [name] MRN: [id number] Date of Birth: [birth year]  Today's Date: 07/24/2015 OT Individual Time: 4818-5909 OT Individual Time Calculation (min): 60 min    Short Term Goals: Week 2:  OT Short Term Goal 1 (Week 2): Pt will transfer to [facility] with assist of one person using sliding board in order to decrease caregiver burden OT Short Term Goal 2 (Week 2): Pt will don pants sitting EOB using lateral leans to pull pants up with supervision and AE PRN OT Short Term Goal 3 (Week 2): Pt will complete bathing/dressing session with only one rest break.  Skilled Therapeutic Interventions/Progress Updates:    Pt seen for OT ADL bathing/dressing session. Pt in supine upon arrival, agreeable to tx session. She voiced urinary incontinence in brief and brief changed and hygiene completed with total A. She transferred to EOB with min A for progressing  L LE off EOB. She sat EOB to complete bathing/dressing task. LEs bathed used LH sponge in order for pt to maintain spinal precautions.She used reacher to assist with LB dressing with bed slightly elevated for her LEs to dangle. Lateral leans completed to pull pants up. Educated and demonstrated use of reacher and sock aid for donning tennis shoes, however, cont to require mod-max A to don B shoes.  She completed sliding board transfer with pt able to place board while therapist lifted thigh and stabilizing assist during transfer. Pt left set-up at sink completing grooming task mod I Discussed with pt at great length regarding toileting tasks/ transfers, timed toileting, bowel program and energy conservation during multiple toileting tasks.  Therapy Documentation Precautions:  Precautions Precautions: Back, Fall Precaution Booklet Issued: No Precaution Comments: Reviewed and performed log roll technique for supine<>sit Restrictions Weight Bearing Restrictions: No Pain: Pain Assessment Pain  Assessment: 0-10 Pain Score: 4  Pain Type: Acute pain Pain Location: Back Pain Orientation: Mid;Upper Pain Descriptors / Indicators: Aching Pain Frequency: Intermittent Pain Intervention(s): Repositioned  See Function Navigator for Current Functional Status.   Therapy/Group: Individual Therapy  Lewis, Talley Kreiser C 07/24/2015, 7:05 AM

## 2015-07-24 NOTE — Progress Notes (Signed)
Occupational Therapy Session Note  Patient Details  Name: Sarah [name] MRN: [id number] Date of Birth: 03/31/1965  Today's Date: 07/24/2015 OT Individual Time: 1100-1130 OT Individual Time Calculation (min): 30 min    Short Term Goals: Week 1:  OT Short Term Goal 1 (Week 1): Pt will complete sliding board transfer to [facility] with max A OT Short Term Goal 1 - Progress (Week 1): Partly met OT Short Term Goal 2 (Week 1): Pt will dress UB with set-up.  OT Short Term Goal 2 - Progress (Week 1): Met OT Short Term Goal 3 (Week 1): Pt will don pants witth mod A in supine OT Short Term Goal 3 - Progress (Week 1): Partly met Week 2:  OT Short Term Goal 1 (Week 2): Pt will transfer to [facility] with assist of one person using sliding board in order to decrease caregiver burden OT Short Term Goal 2 (Week 2): Pt will don pants sitting EOB using lateral leans to pull pants up with supervision and AE PRN OT Short Term Goal 3 (Week 2): Pt will complete bathing/dressing session with only one rest break.      Skilled Therapeutic Interventions/Progress Updates:    Pt seen this session to facilitate lateral leans to enable pt to be more independent with clothing management/ toileting skills. Pt seen in gym, used Slide board with set up only to transfer to mat.  On mat, worked on lateral leans 15 x to R and 15 to left with forearm on large ball rolling ball out to side. Progressed to hand on ball to increase length of movement. Then progressed to full lean onto forearm pushing back into upright sitting focusing on tricep strength to push up fully.  Discussed home strategies of am/ pm toileting with scheduling around time for am/pm dressing.  Also reviewed set up with BSC between bed and sturdy chair so she can lean forearms onto secure surface. Transferred back with board to chair with set up. Pt returned to room with all needs met.  Therapy Documentation Precautions:  Precautions Precautions: Back, Fall Precaution  Booklet Issued: No Precaution Comments: Reviewed and performed log roll technique for supine<>sit Restrictions Weight Bearing Restrictions: No  Pain: Pain Assessment Pain Assessment: 0-10 Pain Score: 2  Pain Type: Acute pain Pain Location: Back Pain Orientation: Mid;Upper Pain Descriptors / Indicators: Aching Pain Frequency: Intermittent Pain Intervention(s): Medication (See eMAR) ADL:   See Function Navigator for Current Functional Status.   Therapy/Group: Individual Therapy  [place] 07/24/2015, 12:51 PM

## 2015-07-25 ENCOUNTER — Inpatient Hospital Stay (HOSPITAL_COMMUNITY): Payer: BLUE CROSS/BLUE SHIELD | Admitting: Physical Therapy

## 2015-07-25 ENCOUNTER — Inpatient Hospital Stay (HOSPITAL_COMMUNITY): Payer: BLUE CROSS/BLUE SHIELD | Admitting: Occupational Therapy

## 2015-07-25 ENCOUNTER — Inpatient Hospital Stay (HOSPITAL_COMMUNITY): Payer: BLUE CROSS/BLUE SHIELD

## 2015-07-25 LAB — URINE CULTURE

## 2015-07-25 LAB — GLUCOSE, CAPILLARY
GLUCOSE-CAPILLARY: 188 mg/dL — AB (ref 65–99)
Glucose-Capillary: 116 mg/dL — ABNORMAL HIGH (ref 65–99)

## 2015-07-25 MED ORDER — NITROFURANTOIN MONOHYD MACRO 100 MG PO CAPS
100.0000 mg | ORAL_CAPSULE | Freq: Two times a day (BID) | ORAL | Status: AC
  Administered 2015-07-25 – 2015-07-31 (×14): 100 mg via ORAL
  Filled 2015-07-25 (×15): qty 1

## 2015-07-25 NOTE — Progress Notes (Signed)
Physical Therapy Session Note  Patient Details  Name: Sarah [name] MRN: [id number] Date of Birth: 1966-03-19  Today's Date: 07/25/2015 PT Individual Time: 0900-1000 PT Individual Time Calculation (min): 60 min   Short Term Goals: Week 2:  PT Short Term Goal 1 (Week 2): Pt will perform consistent transfers with slideboard with mod assist  PT Short Term Goal 2 (Week 2): Pt will be able to perform bed mobility with mod assist for supine <> sit using hosptial bed and AE as needed PT Short Term Goal 3 (Week 2): Pt will be able to tolerate standing with lift equipment at least 2 x a week for tone management, WB through BLE, postural control retraining  Skilled Therapeutic Interventions/Progress Updates:    Session focused on uneven slideboard transfers, neuro re-ed on Nustep for reciprocal movement pattern training and motor activation (x 20 min on level 4) with BLE/BUE and BLE only to tolerance, and trunk control and UE strengthening while performing ball rebounding with straight weight of 3.5# outside of BOS. Pt managed power w/c mod I on unit. Discussed plan for family education to begin as well as possibility of community reintegration outing prior to discharge. Pt agreeable.  Therapy Documentation Precautions:  Precautions Precautions: Back, Fall Precaution Booklet Issued: No Precaution Comments: Reviewed and performed log roll technique for supine<>sit Restrictions Weight Bearing Restrictions: No  Pain:  Premedicated for back pain.    See Function Navigator for Current Functional Status.   Therapy/Group: Individual Therapy  [name], PT, DPT  07/25/2015, 10:04 AM

## 2015-07-25 NOTE — Progress Notes (Signed)
Occupational Therapy Session Note  Patient Details  Name: Sarah [name] MRN: [id number] Date of Birth: 1965-04-24  Today's Date: 07/25/2015 OT Individual Time: 2831-5176 and 1400-1500 OT Individual Time Calculation (min): 60 min and 60 min   Short Term Goals: Week 2:  OT Short Term Goal 1 (Week 2): Pt will transfer to [facility] with assist of one person using sliding board in order to decrease caregiver burden OT Short Term Goal 2 (Week 2): Pt will don pants sitting EOB using lateral leans to pull pants up with supervision and AE PRN OT Short Term Goal 3 (Week 2): Pt will complete bathing/dressing session with only one rest break.  Skilled Therapeutic Interventions/Progress Updates:    Session One: Pt seen for OT ADL bathing/dressing session. Pt in supine upon arrival, agreeable to tx session. She declined getting to [facility] to complete bathing task as it could an opportunity to practice toilet transfer and clothing management on toilet, however pt politely declined saying "maybe tomorrow".  She transferred to EOB with min A for management of L LE off EOB and with increased time to bring trunk up while using hospital bed functions. She completed bathing/dressing with set-up and supervision from EOB using LH sponge and reacher to assist with LB bathing/dressing. Min A sliding board transfer completed to power w/c with pt able to remove board following transfer. Min cuing provided for hand placement and management of LEs during transfer.  She cont to decline practicing toilet transfers following bathing/dressing opting to wait until this afternoon's session.  In electric w/c pt taken to therapy gym and completed 9 min on SCI FIT bike for UE strengthening and cardiorespiratory endurance. Completed on level 1.5. Pt returned to room and left at sink completing grooming tasks. All needs in reach.   Session Two: Pt seen for OT session focusing on toilet transfers and toileting task. Pt sitting up in power w/c  upon arrival, agreeable to tx session. She completed sliding board transfer to drop arm BSC with min A. VCs provided for proper set-up/ positioning of equipment. Lateral leans completed to pull down pants/ brief. Assist required for getting pants down on R side as pt has increased difficulty leaning towards L. She was able to void small amount on BSC. She completed hygiene with set-up of materials using front approach. She demonstrated ability to use toilet aid for completing hygiene following BM. Min A provided for pt to pull pants up, completing multiple lateral leans to B sides leaning into w/c and chair placed on either side. Sliding board transfer completed back to power w/c. She required rest breaks throughout toileting  Tasks and transfers. Discussed with pt time management as toileting tasks take longer than before and different clothing items that could help with toileting task (dress or skirt vs pants, etc). Pt's family to bring in dress/ gowns to trial before d/c. Also discussed need to practice toileting tasks in public restroom as pt will be traveling 1 hour + for chemo treatments at d/c.  In therapy gym, pt completed x2 sets of 10 reps of overhead press using #2 dowel. Completed x2 sets of 10 reps bicep curl and chest press using #5 dowel with VCs for proper form and technique and to ensure not compensating with back. Completed x2 trials of using zoom ball for UE strengthening and endurance.  Pt returned to room at end of session, left set-up with all needs in reach. PA in room as therapist exited.   Therapy Documentation Precautions:  Precautions Precautions: Back, Fall Precaution Booklet Issued: No Precaution Comments: Reviewed and performed log roll technique for supine<>sit Restrictions Weight Bearing Restrictions: No Pain:   No/ denies pain  See Function Navigator for Current Functional Status.   Therapy/Group: Individual Therapy  Lewis, Ksenia Kunz C 07/25/2015, 7:10 AM

## 2015-07-25 NOTE — Progress Notes (Signed)
Physical Therapy Session Note  Patient Details  Name: Sarah [name] MRN: [id number] Date of Birth: May 29, 1965  Today's Date: 07/25/2015 PT Individual Time: 1105-1200 PT Individual Time Calculation (min): 55 min   Short Term Goals: Week 2:  PT Short Term Goal 1 (Week 2): Pt will perform consistent transfers with slideboard with mod assist  PT Short Term Goal 2 (Week 2): Pt will be able to perform bed mobility with mod assist for supine <> sit using hosptial bed and AE as needed PT Short Term Goal 3 (Week 2): Pt will be able to tolerate standing with lift equipment at least 2 x a week for tone management, WB through BLE, postural control retraining  Skilled Therapeutic Interventions/Progress Updates:    Pt received resting upright in w/c with no c/o pain and agreeable to therapy session.  Pt propelled w/c to therapy gym mod I and transferred to therapy mat with slide board and steady assist for transfer, PT assisted with placement of board.  Repeated sit<>stand in STEDY x6 trials with total assist and pt unable to come to full standing, but able to maintain bottom cleared from mat for 5-10 seconds each trial.  Verbal cues for LE extension and chest lifted.  PT instructed pt in BLE therex 2x10 reps of LAQ for NMR and strengthening, BUE therex 2x10 with 1# ball PNF diagonals, shoulder flexion, and chest presses, and PROM stretching 2x20 seconds for neck in all directions except extension.  Pt performed slide board transfer back to w/c with assist for board placement and steady assist for transfer.  Pt returned to room in power chair at end of session and left upright with call bell in reach and needs met.   Therapy Documentation Precautions:  Precautions Precautions: Back, Fall Precaution Booklet Issued: No Precaution Comments: Reviewed and performed log roll technique for supine<>sit Restrictions Weight Bearing Restrictions: No   See Function Navigator for Current Functional  Status.   Therapy/Group: Individual Therapy  Jarell Mcewen E Penven-Crew 07/25/2015, 12:09 PM

## 2015-07-25 NOTE — Progress Notes (Signed)
[place] PHYSICAL MEDICINE & REHABILITATION     PROGRESS NOTE    Subjective/Complaints: Progressing with strength/spasticity. Emptying bladder also  ROS: Pt denies fever, rash/itching, headache, blurred or double vision, nausea, vomiting, abdominal pain,   chest pain, shortness of breath, palpitations, dysuria, dizziness,   bleeding, anxiety, or depression   Objective: Vital Signs: Blood pressure 109/62, pulse 53, temperature 98 F (36.7 C), temperature source Oral, resp. rate 16, height '5\' 11"'$  (1.803 m), weight 112.492 kg (248 lb), last menstrual period 06/06/2015, SpO2 100 %. No results found. No results for input(s): WBC, HGB, HCT, PLT in the last 72 hours. No results for input(s): NA, K, CL, GLUCOSE, BUN, CREATININE, CALCIUM in the last 72 hours.  Invalid input(s): CO CBG (last 3)   Recent Labs  07/24/15 0629 07/24/15 1823 07/25/15 0644  GLUCAP 110* 252* 116*    Wt Readings from Last 3 Encounters:  07/19/15 112.492 kg (248 lb)  07/06/15 117.3 kg (258 lb 9.6 oz)  04/20/14 127.007 kg (280 lb)    Physical Exam:  Constitutional: She is oriented to person, place, and time. She appears well-developed and well-nourished.  Obese female.  HENT:  Head: Normocephalic and atraumatic.  Mouth/Throat: Oropharynx is clear and moist.  Eyes: Conjunctivae and EOM are normal. Pupils are equal, round, and reactive to light.  Neck: Normal range of motion. Neck supple.  Cardiovascular: Normal rate and regular rhythm.  No murmur heard. Respiratory: Effort normal. No stridor. She has decreased breath sounds in the right lower field and the left lower field. She has no wheezes. She has no rales.  GI: Soft. Bowel sounds are normal. She exhibits no distension. There is no tenderness. There is no guarding.  Genitourinary:        Musculoskeletal: She exhibits no edema or tenderness.  Neurological: She is alert and oriented to person, place, and time. She displays abnormal reflex. She  exhibits abnormal muscle tone. Coordination abnormal.  Flat but speech clear and able to follow commands without difficulty.  BUE 5/5 proximal to distal.  RLE: hip flexion, knee extension 2+/5, ankle dorsi/plantar flexion 3+/5 LLE: 2-/5 proximal to distal  Extensor tone. 1/4,   improved Tight heel cords remain bilaterally.  DTR 3+ b/l LE  Skin: Skin is warm and dry.  Upper thoracic incision clean/sutures/minimal drainage Psychiatric: Her speech is normal and behavior is normal. Judgment and thought content normal. Her affect is blunt. Cognition and memory are normal.   Assessment/Plan: 1. 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 Physiatrist is providing close team supervision and 24 hour management of active medical problems listed below. Physiatrist and rehab team continue to assess barriers to discharge/monitor patient progress toward functional and medical goals.  Function:  Bathing Bathing position Bathing activity did not occur: Refused Position: Sitting EOB  Bathing parts Body parts bathed by patient: Right arm, Left arm, Chest, Abdomen, Right upper leg, Left upper leg, Right lower leg, Left lower leg Body parts bathed by helper: Back  Bathing assist Assist Level: 2 helpers      Upper Body Dressing/Undressing Upper body dressing   What is the patient wearing?: Pull over shirt/dress, Bra Bra - Perfomed by patient: Thread/unthread right bra strap, Thread/unthread left bra strap Bra - Perfomed by helper: Hook/unhook bra (pull down sports bra) Pull over shirt/dress - Perfomed by patient: Thread/unthread right sleeve, Thread/unthread left sleeve, Put head through opening Pull over shirt/dress -  Perfomed by helper: Pull shirt over trunk        Upper body assist Assist Level: Set up, Supervision or verbal cues   Set up : To obtain clothing/put away   Lower Body Dressing/Undressing Lower body dressing Lower body dressing/undressing activity did not occur: Refused What is the patient wearing?: Pants, Shoes     Pants- Performed by patient: Thread/unthread right pants leg, Thread/unthread left pants leg, Pull pants up/down Pants- Performed by helper: Pull pants up/down   Non-skid slipper socks- Performed by helper: Don/doff right sock, Don/doff left sock     Shoes - Performed by patient: Don/doff right shoe, Don/doff left shoe Shoes - Performed by helper: Don/doff right shoe, Don/doff left shoe, Fasten right, Fasten left          Lower body assist Assist for lower body dressing: Touching or steadying assistance (Pt > 75%)      Toileting Toileting Toileting activity did not occur: No continent bowel/bladder event Toileting steps completed by patient: Performs perineal hygiene, Adjust clothing prior to toileting Toileting steps completed by helper: Adjust clothing prior to toileting, Adjust clothing after toileting Toileting Assistive Devices: Grab bar or rail  Toileting assist Assist level: Two helpers   Transfers Chair/bed transfer Chair/bed transfer activity did not occur: Safety/medical concerns (Pt unable to successfuly complete attempt) Chair/bed transfer method: Lateral scoot Chair/bed transfer assist level: Set up only Chair/bed transfer assistive device: Sliding board, Armrests     Locomotion Ambulation Ambulation activity did not occur: N/A (WC level)         Wheelchair Wheelchair activity did not occur: Safety/medical concerns Type: Motorized Max wheelchair distance: 250' Assist Level: No help, No cues, assistive device, takes more than reasonable amount of time  Cognition Comprehension Comprehension assist level: Follows complex conversation/direction with no assist  Expression Expression assist level: Expresses complex ideas: With no assist  Social Interaction Social Interaction assist level: Interacts  appropriately with others with medication or extra time (anti-anxiety, antidepressant).  Problem Solving Problem solving assist level: Solves complex problems: Recognizes & self-corrects  Memory Memory assist level: Complete Independence: No helper    Medical Problem List and Plan: 1. 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 H/o PE/Anticoagulation: Pharmaceutical: Xarelto, No signs of bleeding 3. Pain Management: Controlled currently on oxycodone prn.  -treat as needed 4. Mood: LCSW to follow for evaluation and support. Ego support provided. She is aware of hard work ahead of her but has good social supports at home.  5. Neuropsych: This patient is capable of making decisions on her own behalf. 6. Skin/Wound Care: Routine pressure relief measures. Continue to monitor thoracic incision for healing as well as skin due to bowel incontinence.  7. Fluids/Electrolytes/Nutrition: Monitor I/O. Appetite has been good so far.   8.Metastatic cancer with T3 pathologic FX with cord compression and new osseous mets T12- L3/S1:   -has appt at [facility] on 5/9---dc date is that day---may have to reschedule 9. Steroid induced Hyperglycemia: Blood sugars improving with decadron taper. Will monitor BID for now.  CBG (last 3)   Recent Labs  07/24/15 0629 07/24/15 1823 07/25/15 0644  GLUCAP 110* 252* 116*    10. HTN: Continue to monitor BP bid--off prinivil at this time as blood pressures controlled. May need to be resumed as activity level improves  11. Spasticity: Continue baclofen 20 mg 5 X day with valium prn. Well controlled at  present  -  zanaflex now at '2mg'$  tid and '4mg'$  qhs ---effective    12. ABLA: hgb up to 12.7  13. Leucocytosis: Likely steroid induced.  afebrile 14. 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 H/o depression: Resume home  dose Zoloft. 15. Neurogenic bowel : changed to pm bowel program per pt preference  -colace in am  -enema prn 16. Neurogenic bladder:  Beginning to void. I/o cath prn  -e coli UTI sens to macrobid completed---repeat cx + for e coli. Restart macrobid---may need to continue for longer course (10d)  -  urecholine to '25mg'$  TID, timed voids  LOS (Days) 14 A FACE TO FACE EVALUATION WAS PERFORMED  SWARTZ,ZACHARY T 07/25/2015 9:09 AM

## 2015-07-26 ENCOUNTER — Inpatient Hospital Stay (HOSPITAL_COMMUNITY): Payer: BLUE CROSS/BLUE SHIELD | Admitting: Occupational Therapy

## 2015-07-26 ENCOUNTER — Inpatient Hospital Stay (HOSPITAL_COMMUNITY): Payer: BLUE CROSS/BLUE SHIELD

## 2015-07-26 LAB — GLUCOSE, CAPILLARY
GLUCOSE-CAPILLARY: 128 mg/dL — AB (ref 65–99)
GLUCOSE-CAPILLARY: 125 mg/dL — AB (ref 65–99)

## 2015-07-26 NOTE — Progress Notes (Signed)
[place] PHYSICAL MEDICINE & REHABILITATION     PROGRESS NOTE    Subjective/Complaints: No new issues. Improved bladder emptying. Pain controlled. Good appetite.   ROS: Pt denies fever, rash/itching, headache, blurred or double vision, nausea, vomiting, abdominal pain,   chest pain, shortness of breath, palpitations, dysuria, dizziness,   bleeding, anxiety, or depression   Objective: Vital Signs: Blood pressure 95/68, pulse 65, temperature 97 F (36.1 C), temperature source Oral, resp. rate 18, height '5\' 11"'$  (1.803 m), weight 112.492 kg (248 lb), last menstrual period 06/06/2015, SpO2 100 %. No results found. No results for input(s): WBC, HGB, HCT, PLT in the last 72 hours. No results for input(s): NA, K, CL, GLUCOSE, BUN, CREATININE, CALCIUM in the last 72 hours.  Invalid input(s): CO CBG (last 3)   Recent Labs  07/25/15 0644 07/25/15 1742 07/26/15 0602  GLUCAP 116* 188* 128*    Wt Readings from Last 3 Encounters:  07/19/15 112.492 kg (248 lb)  07/06/15 117.3 kg (258 lb 9.6 oz)  04/20/14 127.007 kg (280 lb)    Physical Exam:  Constitutional: She is oriented to person, place, and time. She appears well-developed and well-nourished.  Obese female.  HENT:  Head: Normocephalic and atraumatic.  Mouth/Throat: Oropharynx is clear and moist.  Eyes: Conjunctivae and EOM are normal. Pupils are equal, round, and reactive to light.  Neck: Normal range of motion. Neck supple.  Cardiovascular: Normal rate and regular rhythm.  No murmur heard. Respiratory: Effort normal. No stridor. She has decreased breath sounds in the right lower field and the left lower field. She has no wheezes. She has no rales.  GI: Soft. Bowel sounds are normal. She exhibits no distension. There is no tenderness. There is no guarding.  Genitourinary:        Musculoskeletal: She exhibits no edema or tenderness.  Neurological: She is alert and oriented to person, place, and time. She displays  abnormal reflex. She exhibits abnormal muscle tone. Coordination abnormal.  Flat but speech clear and able to follow commands without difficulty.  BUE 5/5 proximal to distal.  RLE: hip flexion, knee extension 2+/5, ankle dorsi/plantar flexion 3+/5 LLE: 2-/5 proximal to distal  Extensor tone. 1/4,   improved Tight heel cords remain bilaterally.  DTR 3+ b/l LE  Skin: Skin is warm and dry.  Upper thoracic incision clean/sutures/minimal drainage Psychiatric: Her speech is normal and behavior is normal. Judgment and thought content normal. Her affect is blunt. Cognition and memory are normal.   Assessment/Plan: 1. 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 Physiatrist is providing close team supervision and 24 hour management of active medical problems listed below. Physiatrist and rehab team continue to assess barriers to discharge/monitor patient progress toward functional and medical goals.  Function:  Bathing Bathing position Bathing activity did not occur: Refused Position: Sitting EOB (Buttock/ perineal area cmopleted in supine)  Bathing parts Body parts bathed by patient: Right arm, Left arm, Chest, Abdomen, Right upper leg, Left upper leg, Right lower leg, Left lower leg, Front perineal area, Buttocks Body parts bathed by helper: Back  Bathing assist Assist Level: Set up, Supervision or verbal cues   Set up : To obtain items  Upper Body Dressing/Undressing Upper body dressing   What is the patient wearing?: Pull over shirt/dress, Bra Bra - Perfomed by patient: Thread/unthread right bra strap, Thread/unthread left bra strap, Hook/unhook bra (pull down sports bra) Bra -  Perfomed by helper: Hook/unhook bra (pull down sports bra) Pull over shirt/dress - Perfomed by patient: Thread/unthread right sleeve, Thread/unthread left sleeve, Put head through opening,  Pull shirt over trunk Pull over shirt/dress - Perfomed by helper: Pull shirt over trunk        Upper body assist Assist Level: Set up, Supervision or verbal cues   Set up : To obtain clothing/put away  Lower Body Dressing/Undressing Lower body dressing Lower body dressing/undressing activity did not occur: Refused What is the patient wearing?: Pants, Shoes     Pants- Performed by patient: Thread/unthread right pants leg, Thread/unthread left pants leg, Pull pants up/down Pants- Performed by helper: Pull pants up/down   Non-skid slipper socks- Performed by helper: Don/doff right sock, Don/doff left sock     Shoes - Performed by patient: Don/doff right shoe, Don/doff left shoe Shoes - Performed by helper: Don/doff right shoe, Don/doff left shoe, Fasten right, Fasten left          Lower body assist Assist for lower body dressing: Touching or steadying assistance (Pt > 75%)      Toileting Toileting Toileting activity did not occur: No continent bowel/bladder event Toileting steps completed by patient: Adjust clothing prior to toileting, Performs perineal hygiene, Adjust clothing after toileting Toileting steps completed by helper: Adjust clothing prior to toileting, Adjust clothing after toileting Toileting Assistive Devices: Grab bar or rail  Toileting assist Assist level: Touching or steadying assistance (Pt.75%)   Transfers Chair/bed transfer Chair/bed transfer activity did not occur: Safety/medical concerns (Pt unable to successfuly complete attempt) Chair/bed transfer method: Lateral scoot Chair/bed transfer assist level: Touching or steadying assistance (Pt > 75%) Chair/bed transfer assistive device: Sliding board, Armrests     Locomotion Ambulation Ambulation activity did not occur: N/A (WC level)         Wheelchair Wheelchair activity did not occur: Safety/medical concerns Type: Motorized Max wheelchair distance: 200 Assist Level: No help, No cues, assistive  device, takes more than reasonable amount of time  Cognition Comprehension Comprehension assist level: Follows complex conversation/direction with no assist  Expression Expression assist level: Expresses complex ideas: With no assist  Social Interaction Social Interaction assist level: Interacts appropriately with others - No medications needed.  Problem Solving Problem solving assist level: Solves complex problems: Recognizes & self-corrects  Memory Memory assist level: Complete Independence: No helper    Medical Problem List and Plan: 1. 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 H/o PE/Anticoagulation: Pharmaceutical: Xarelto, No signs of bleeding 3. Pain Management: Controlled currently on oxycodone prn.  -treat as needed 4. Mood: LCSW to follow for evaluation and support. Ego support provided. She is aware of hard work ahead of her but has good social supports at home.  5. Neuropsych: This patient is capable of making decisions on her own behalf. 6. Skin/Wound Care: Routine pressure relief measures. Continue to monitor thoracic incision for healing as well as skin due to bowel incontinence.  7. Fluids/Electrolytes/Nutrition: Monitor I/O. Appetite has been good so far.   8.Metastatic cancer with T3 pathologic FX with cord compression and new osseous mets T12- L3/S1:   -has appt at [facility] on 5/9---working on alternative plan 9. Steroid induced Hyperglycemia: Blood sugars improving with decadron taper. Will monitor BID for now.  CBG (last 3)   Recent Labs  07/25/15 0644 07/25/15 1742 07/26/15 0602  GLUCAP 116* 188* 128*    10. HTN:  Continue to monitor BP bid--off prinivil at this time as blood pressures controlled. May need to be resumed as activity level improves  11. Spasticity: Continue baclofen 20 mg 5 X day with valium prn. Well controlled at  present  -  zanaflex now at '2mg'$  tid and '4mg'$  qhs ---effective    12. ABLA: hgb up to 12.7  13. Leucocytosis: Likely steroid induced.  afebrile 14. 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 H/o depression: Resume home dose Zoloft. 15. Neurogenic bowel : changed to pm bowel program per pt preference  -colace in am  -enema prn 16. Neurogenic bladder:  Beginning to void. I/o cath prn  -e colit UTI. Restarted macrobid---may need to continue for longer course (10d)  -  urecholine to '25mg'$  TID, timed voids  -needs to be up on BSC or toilet to empty  LOS (Days) 15 A FACE TO FACE EVALUATION WAS PERFORMED  SWARTZ,ZACHARY T 07/26/2015 8:56 AM

## 2015-07-26 NOTE — Progress Notes (Signed)
Occupational Therapy Session Note  Patient Details  Name: ANAHITA CUA MRN: [id number] Date of Birth: [birth year]  Today's Date: 07/26/2015 OT Individual Time: 1405-1500 OT Individual Time Calculation (min): 55 min   Short Term Goals: Week 1:  OT Short Term Goal 1 (Week 1): Pt will complete sliding board transfer to [facility] with max A OT Short Term Goal 1 - Progress (Week 1): Partly met OT Short Term Goal 2 (Week 1): Pt will dress UB with set-up.  OT Short Term Goal 2 - Progress (Week 1): Met OT Short Term Goal 3 (Week 1): Pt will don pants witth mod A in supine OT Short Term Goal 3 - Progress (Week 1): Partly met   Week 2:  OT Short Term Goal 1 (Week 2): Pt will transfer to [facility] with assist of one person using sliding board in order to decrease caregiver burden OT Short Term Goal 1 - Progress (Week 2): Met OT Short Term Goal 2 (Week 2): Pt will don pants sitting EOB using lateral leans to pull pants up with supervision and AE PRN OT Short Term Goal 2 - Progress (Week 2): Met OT Short Term Goal 3 (Week 2): Pt will complete bathing/dressing session with only one rest break. OT Short Term Goal 3 - Progress (Week 2): Met   Week 3:  OT Short Term Goal 1 (Week 3): STG=LTG due to LOS  Skilled Therapeutic Interventions/Progress Updates:  Pt found seated in w/c upon OT entering room, pt with no complaints of pain; however pt with complaints of BLE numbness and tingling that she states is ongoing. Pt operated w/c room to therapy gym at mod I level, no assistance or cueing needed. Pt positioned w/c near therapy mat and performed slide board transfer w/c to edge of mat. Therapist assisted with slide board placement and also provided min guard assist/blocked bilateral knees as pt performed an uphill transfer. Pt sat edge of mat and worked on right to/from left scooting, lateral leans (performing 2 sets of 10 ball rolling activities to increase flexibility and independence with lateral leans), and dynamic  sitting activity by completing BUE exercises (shoulder flexion/extension, push press chest to ceiling, push press chest to outwards). Therapist then performed KT taping to bilateral shoulder blades due to complaints of tightness and occasional pain. At end of session, left pt seated edge of mat with PT present.   Therapy Documentation Precautions:  Precautions Precautions: Back, Fall Precaution Booklet Issued: No Precaution Comments: Reviewed and performed log roll technique for supine<>sit Restrictions Weight Bearing Restrictions: No  See Function Navigator for Current Functional Status.  Therapy/Group: Individual Therapy  Chrys Racer , MS, OTR/L, CLT 07/26/2015, 3:25 PM

## 2015-07-26 NOTE — Progress Notes (Signed)
Physical Therapy Session Note  Patient Details  Name: Sarah [name] MRN: [id number] Date of Birth: [birth year]  Today's Date: 07/26/2015 PT Individual Time: 1000-1100 PT Individual Time Calculation (min): 60 min   Short Term Goals: Week 2:  PT Short Term Goal 1 (Week 2): Pt will perform consistent transfers with slideboard with mod assist  PT Short Term Goal 2 (Week 2): Pt will be able to perform bed mobility with mod assist for supine <> sit using hosptial bed and AE as needed PT Short Term Goal 3 (Week 2): Pt will be able to tolerate standing with lift equipment at least 2 x a week for tone management, WB through BLE, postural control retraining  Skilled Therapeutic Interventions/Progress Updates:   Community w/c mobility activity off unit in gift shop practicing navigating through tight spaces, education on community re-integration, and overall energy conservation. Pt able to get items and purchase at mod I level. Functional transfer training with slideboard at overall min assist level due to assist needed for placement of board and lifting of leg enough to clear board and close supervision for scoot part. Required assist with management of footplates and foot positioning at times. Neuro re-ed to attempt sit <> stands, weightbearing, postural control, and strengthening in maxi sky from elevated mat with parallel bars for support. With facilitation at hips and approximation at quads, with +2 assist able to clear bottom but not achieve full standing position on several repetitions. May benefit from trial with Clarise Cruz Plus lift in future session.   Therapy Documentation Precautions:  Precautions Precautions: Back, Fall Precaution Booklet Issued: No Precaution Comments: Reviewed and performed log roll technique for supine<>sit Restrictions Weight Bearing Restrictions: No   Pain:  Premedicated for back pain.   See Function Navigator for Current Functional Status.   Therapy/Group: Individual  Therapy  [name], PT, DPT  07/26/2015, 12:12 PM

## 2015-07-26 NOTE — Progress Notes (Signed)
Physical Therapy Weekly Progress Note  Patient Details  Name: SAVVY PEETERS MRN: [id number] Date of Birth: 1965/06/15  Beginning of progress report period: July 21, 2015 End of progress report period: Jul 26, 2015  Today's Date: 07/26/2015 PT Individual Time: 1500-1530 (30 min) Pt verbalized fatigue from just finishing OT session. Declined Nustep, but agreeable to UE strengthening and endurance training on SCI Fit machine x 8 min total (4 min forward and 4 min backwards on random program level 3). Pt performed min assist transfer  (+2 present for safety per patient request due to new transfer) from mat to w/c (with leg rests still extended but footplates up) to attempt to increase independence with transfer (pt likely still unable to manage lifting footplates at this point though). Will talk with [employer], w/c vendor, to determine if possible for patient to have electric elevating legrests as this would greatly increase pt's ability in future to be independent with transfers. Pt returned back to room mod I in power w/c.   Patient has met 3 of 3 short term goals. Pt is making great progress with functional transfers and bed mobility and on track to meet goals. Pt will still require mod assist for sit to supine due to back precautions, tone and weight of BLE.   Patient continues to demonstrate the following deficits: decreased strength, paraparesis, decreased endurance, decreased postural control, decreased balance, pain, decreased functional mobility and therefore will continue to benefit from skilled PT intervention to enhance overall performance with activity tolerance, balance, postural control, ability to compensate for deficits, functional use of  right lower extremity and left lower extremity and knowledge of precautions.  Patient progressing toward long term goals..  Continue plan of care.  PT Short Term Goals Week 2:  PT Short Term Goal 1 (Week 2): Pt will perform consistent transfers with  slideboard with mod assist  PT Short Term Goal 1 - Progress (Week 2): Met PT Short Term Goal 2 (Week 2): Pt will be able to perform bed mobility with mod assist for supine <> sit using hosptial bed and AE as needed PT Short Term Goal 2 - Progress (Week 2): Met PT Short Term Goal 3 (Week 2): Pt will be able to tolerate standing with lift equipment at least 2 x a week for tone management, WB through BLE, postural control retraining PT Short Term Goal 3 - Progress (Week 2): Met Week 3:  PT Short Term Goal 1 (Week 3): = LTGs with D/c tentative 5/10 at min assist w/c level  Skilled Therapeutic Interventions/Progress Updates:  Balance/vestibular training;Community reintegration;Discharge planning;Disease management/prevention;DME/adaptive equipment instruction;Functional mobility training;Neuromuscular re-education;Pain management;Patient/family education;Psychosocial support;Skin care/wound management;Therapeutic Exercise;Splinting/orthotics;Therapeutic Activities;UE/LE Strength taining/ROM;UE/LE Coordination activities;Wheelchair propulsion/positioning   Therapy Documentation Precautions:  Precautions Precautions: Back, Fall Precaution Booklet Issued: No Precaution Comments: Reviewed and performed log roll technique for supine<>sit Restrictions Weight Bearing Restrictions: No  Pain: Denies pain.  See Function Navigator for Current Functional Status.  Therapy/Group: Individual Therapy  [name], PT, DPT  07/26/2015, 3:38 PM

## 2015-07-26 NOTE — Progress Notes (Signed)
Occupational Therapy Weekly Progress Note  Patient Details  Name: Sarah [name] MRN: [id number] Date of Birth: [birth year]  Beginning of progress report period: July 19, 2015 End of progress report period: Jul 26, 2015  Today's Date: 07/26/2015 OT Individual Time: 8921-1941 and 1130-1200 OT Individual Time Calculation (min): 60 min and 30 min   Patient has met 3 of 3 short term goals.  Pt making excellent progress towards OT goals. She is completing functional transfers at min-mod A. Bathing/dressing completed EOB with supervision/ set-up assist using AE. 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  Patient progressing toward long term goals..  Plan of care revisions: Have revised LB dressing goals as pt with difficulty with donning shoes. .  OT Short Term Goals Week 2:  OT Short Term Goal 1 (Week 2): Pt will transfer to [facility] with assist of one person using sliding board in order to decrease caregiver burden OT Short Term Goal 1 - Progress (Week 2): Met OT Short Term Goal 2 (Week 2): Pt will don pants sitting EOB using lateral leans to pull pants up with supervision and AE PRN OT Short Term Goal 2 - Progress (Week 2): Met OT Short Term Goal 3 (Week 2): Pt will complete bathing/dressing session with only one rest break. OT Short Term Goal 3 - Progress (Week 2): Met Week 3:  OT Short Term Goal 1 (Week 3): STG=LTG due to LOS  Skilled Therapeutic Interventions/Progress Updates:    Pt seen for OT ADL bathing/dressing session. Pt in supine upon arrival, agreeable to tx session. She transferred to EOB with min A for management  of LEs and using hospital bed functions. She bathed with supervision seated EOB. PT able to lift LEs off floor to thread pants over feet, an improvement from previous session where she required bed to be elevated inorder to thread pants around feet demonstrating improved LE movement. Attempted various sitting/ LE positioning for pt to be able to don shoes with increased independence, however, due to tone and decreased LE movement, pt unable to obtain and maintain functional position to don shoes. She was able to don R shoe with therapist holding LE in position, unable to obtain that position with L LE. She completed min A slide board to power w/c with education provided regarding positioning of sliding board for optimal sitting position.  In therapy gym, pt completed step ups on 6 inch step from seated position with pt using leg loops to manage LEs in prep for functional transfers. Pt returned to room at end of session and left set-up at sink to complete grooming tasks, all needs in reach. Discussed with pt toileting tasks at d/c and access of public restrooms when she goes out for chemo txs. Will discuss with MD possibility of in/out caths prior to leaving for chemo sessions as pt will likely have difficulty finding bathroom that will accommodate power w/c and will be accessible for sliding board transfers.   Session Two: Pt seen for OT session focusing on functional transfers and toileting task. Pt sitting up in w/c upon arrival, ready to get to [facility]. Pt transferred via sliding board from power w/c to drop arm BSC.  Initially requiring min A, however, clothing got stuck in sliding board and required max A to manage clothing and cont with transfer.  Sitting on BSC, pt completed lateral leans onto bed and into w/c to complete clothing management. +2 available to steady equipment on transfer back to chair, however, was only utilized to steady equipment. Pt returned to w/c at end of session, all needs in reach.    Therapy Documentation Precautions:  Precautions Precautions: Back, Fall Precaution Booklet Issued: No Precaution Comments: Reviewed and performed log roll technique for supine<>sit Restrictions Weight Bearing Restrictions: No Pain:   No/ denies pain  See Function Navigator for Current Functional Status.   Therapy/Group: Individual Therapy  Lewis, Aleza Pew C 07/26/2015, 7:56 AM

## 2015-07-26 NOTE — Consult Note (Signed)
PSYCHODIAGNOSTIC EVALUATION - [facility]   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    During the current session, [name] reported that in general she is feeling "pretty good," though she lamented that she is having to go through all of this recovery again.  She did note that she has a different perspective this time and it has been helpful to know that she was able to learn how to successfully manage her affairs before so that she can hopefully do so again.  She expressed feeling significantly motivated to participate in the various treatments offered on the unit and said that her energy level typically remains high until the end of the day.  [name] noted that she was initially depressed, but said that she does not feel depressed now.  She generally denied worries or concerns, though noted that at times she wonders whether her family will be able to help her post-discharge.  She said that she plans to learn and remember as much as possible while on the unit and try to get as strong as possible.  She was aware of her physical goals and said that she feels good about them.  [name] commented that there are occasions at night during which she cries and feels lonely, though in general she said that she is not sad.  She denied problems with sleep or appetite.    IMPRESSION:  [name] seems to be coping fairly well with her current stressors and has realistic expectations about her potential for improvement while on the unit.  She does seem to be having mild adjustment disorder with depressed mood, which will likely resolve over time.  However, if it does not resolve post-discharge, she could engage in individual psychotherapy.  Continued follow-up with the neuropsychologist  while she is on the unit could be requested, should the treatment team feel that it would be beneficial in informing care.    DIAGNOSIS:   Adjustment disorder with depressed mood  Marlane Hatcher, Psy.D.  Clinical Neuropsychologist

## 2015-07-27 ENCOUNTER — Inpatient Hospital Stay (HOSPITAL_COMMUNITY): Payer: BLUE CROSS/BLUE SHIELD | Admitting: Occupational Therapy

## 2015-07-27 ENCOUNTER — Inpatient Hospital Stay (HOSPITAL_COMMUNITY): Payer: BLUE CROSS/BLUE SHIELD

## 2015-07-27 LAB — GLUCOSE, CAPILLARY
GLUCOSE-CAPILLARY: 107 mg/dL — AB (ref 65–99)
GLUCOSE-CAPILLARY: 161 mg/dL — AB (ref 65–99)

## 2015-07-27 NOTE — Progress Notes (Signed)
Occupational Therapy Session Note  Patient Details  Name: Sarah [name] MRN: [id number] Date of Birth: Jul 29, 1965  Today's Date: 07/27/2015 OT Individual Time: 4665-9935 OT Individual Time Calculation (min): 60 min    Short Term Goals: Week 3:  OT Short Term Goal 1 (Week 3): STG=LTG due to LOS  Skilled Therapeutic Interventions/Progress Updates:    Pt seen for OT ADL bathing/dressing session. Pt in supine upon arrival, agreeable to tx session. She transferred to EOB with min A for management of R LE and rotating hips towards EOB. She completed bathing/dressing seated EOB using LH sponge and reacher to assist with LE bathing/dressing. She completed min A sliding board transfer with assist only to place board and VCs for hand placement and weight shift/ head/ hips relation.   Pt taken to ADL apartment to wash hair in sink as sink in pt's room not accessible via her power w/c. She washed hair and completed grooming tasks at sink with set-up.    She requested to use toilet at end of session. Min A sliding board transfer completed to drop arm BSC> Pt able to complete lateral leans into w/c and onto EOB to manage pants down and assist provided to remove brief.  Pt left sitting on BSC at end of session, call bell in reach . NT made aware of pt's position. Followed up with MD and he recommends practicing public bathroom transfers for days when pt goes to chemo as she has been able to empty enough during voiding trials to reduce need for in/out cathing.  Will make remaining members of therapy staff aware in order to incoroproate into tx plan.   Therapy Documentation Precautions:  Precautions Precautions: Back, Fall Precaution Booklet Issued: No Precaution Comments: Reviewed and performed log roll technique for supine<>sit Restrictions Weight Bearing Restrictions: No Pain:   No/ denies pain  See Function Navigator for Current Functional Status.   Therapy/Group: Individual Therapy  Lewis,  Haldon Carley C 07/27/2015, 7:13 AM

## 2015-07-27 NOTE — Progress Notes (Signed)
Physical Therapy Session Note  Patient Details  Name: Sarah [name] MRN: [id number] Date of Birth: 05-17-1965  Today's Date: 07/27/2015 PT Individual Time: 1400-1500 PT Individual Time Calculation (min): 60 min   Short Term Goals: Week 3:  PT Short Term Goal 1 (Week 3): = LTGs with D/c tentative 5/10 at min assist w/c level  Skilled Therapeutic Interventions/Progress Updates:    Session focused on functional uneven transfers with slideboard, neuro re-ed on Nustep for reciprocal movement pattern retaining and functional motor activation in BLE with UE assist x 15 min on level 4, and seated LE therex for functional strengthening and motor activation/control. LE therex including heel raises x 20 reps, hip adduction ball squeezes with 5 sec hold x 20 reps,  Toe raises x 20 reps, LAQ x 20 reps, and marches x 20 reps each all BLE. Requires some active assist for LLE for LAQ and marches. Discussed potential outing for Monday and community reintegration goals.   Therapy Documentation Precautions:  Precautions Precautions: Back, Fall Precaution Booklet Issued: No Precaution Comments: Reviewed and performed log roll technique for supine<>sit Restrictions Weight Bearing Restrictions: No  Pain:  No complaints.     See Function Navigator for Current Functional Status.   Therapy/Group: Individual Therapy  [name], PT, DPT  07/27/2015, 3:15 PM

## 2015-07-27 NOTE — Progress Notes (Signed)
Occupational Therapy Session Note  Patient Details  Name: Sarah [name] MRN: [id number] Date of Birth: 02-May-1965  Today's Date: 07/27/2015 OT Individual Time: 1300-1400 OT Individual Time Calculation (min): 60 min    Short Term Goals: Week 3:  OT Short Term Goal 1 (Week 3): STG=LTG due to LOS  Skilled Therapeutic Interventions/Progress Updates:    Pt seen for OT session focusing on functional transfers and toileting task. Pt sitting up in power w/c upon arrival, voicing need for toileting task. She transferred to drop arm BSC via sliding board with min guarding assist. She completed lateral leans to manage clothing. She was able to complete pericare hygiene from frontal approach, however, therapist needed to assist with buttock hygiene as pt unable to reach backside via lateral leans. She transferred back to power w/c with min A.  Pt take to public restroom on unit to practice w/c set-up in community bathroom setting. W/c positioned with VCs and sliding board placed for visualization of transfer. Will practice actual community toilet transfer in upcoming session as pt will have to transfer to actual toilet when traveling to receive chemo at d/c.  Pt returned to gym at end of session and left for hand off to PT. Recommended pt wear dresses/ skirts in order to ease clothing management tasks esp for public restroom transfers as it will be difficult to caregivers to assist with clothing in community setting.   Therapy Documentation Precautions:  Precautions Precautions: Back, Fall Precaution Booklet Issued: No Precaution Comments: Reviewed and performed log roll technique for supine<>sit Restrictions Weight Bearing Restrictions: No  See Function Navigator for Current Functional Status.   Therapy/Group: Individual Therapy  Lewis, Icesis Renn C 07/27/2015, 1:21 PM

## 2015-07-27 NOTE — Progress Notes (Signed)
Physical Therapy Session Note  Patient Details  Name: Sarah [name] MRN: [id number] Date of Birth: 08/30/1965  Today's Date: 07/27/2015 PT Individual Time: 1015-1115 PT Individual Time Calculation (min): 60 min   Short Term Goals: Week 3:  PT Short Term Goal 1 (Week 3): = LTGs with D/c tentative 5/10 at min assist w/c level  Skilled Therapeutic Interventions/Progress Updates:   Pt performing transfer off of BSC with NTs in room at min assist level for transfer. Focused on neuro re-ed for sit <> stands, standing tolerance, postural control and weightbearing through BLE using Sara Plus from elevated mat x 4 repetitions (once for 47 seconds and last time for 35 seconds) with cues for upright posture, hip and knee extension, and neck extension. Weightshifting in standing with faciliation at hips as well. Pt able to perform transfers during session with min assist overall using slideboard - w/c to mat actual transfer with close supervision but min assist back due to LE placement with footplates and min assist for balance. Seated UE therex for functional strengthening with 3.5# straight weight for biceps curls, chest press, shoulder press and elbow flexion with pronation x 10 reps x 2 sets.   Therapy Documentation Precautions:  Precautions Precautions: Back, Fall Precaution Booklet Issued: No Precaution Comments: Reviewed and performed log roll technique for supine<>sit Restrictions Weight Bearing Restrictions: No   Pain: Premedicated for pain.    See Function Navigator for Current Functional Status.   Therapy/Group: Individual Therapy  [name], PT, DPT  07/27/2015, 12:07 PM

## 2015-07-27 NOTE — Progress Notes (Signed)
[place] [facility]ILITATION     PROGRESS NOTE    Subjective/Complaints: Had a good night. Pain controlled. Emptying bladder more efficiently   ROS: Pt denies fever, rash/itching, headache, blurred or double vision, nausea, vomiting, abdominal pain,   chest pain, shortness of breath, palpitations, dysuria, dizziness,   bleeding, anxiety, or depression   Objective: Vital Signs: Blood pressure 111/56, pulse 55, temperature 97.7 F (36.5 C), temperature source Oral, resp. rate 17, height '5\' 11"'$  (1.803 m), weight 112.492 kg (248 lb), last menstrual period 06/06/2015, SpO2 100 %. No results found. No results for input(s): WBC, HGB, HCT, PLT in the last 72 hours. No results for input(s): NA, K, CL, GLUCOSE, BUN, CREATININE, CALCIUM in the last 72 hours.  Invalid input(s): CO CBG (last 3)   Recent Labs  07/26/15 0602 07/26/15 1645 07/27/15 0612  GLUCAP 128* 125* 107*    Wt Readings from Last 3 Encounters:  07/19/15 112.492 kg (248 lb)  07/06/15 117.3 kg (258 lb 9.6 oz)  04/20/14 127.007 kg (280 lb)    Physical Exam:  Constitutional: She is oriented to person, place, and time. She appears well-developed and well-nourished.  Obese female.  HENT:  Head: Normocephalic and atraumatic.  Mouth/Throat: Oropharynx is clear and moist.  Eyes: Conjunctivae and EOM are normal. Pupils are equal, round, and reactive to light.  Neck: Normal range of motion. Neck supple.  Cardiovascular: Normal rate and regular rhythm.  No murmur heard. Respiratory: Effort normal. No stridor. She has decreased breath sounds in the right lower field and the left lower field. She has no wheezes. She has no rales.  GI: Soft. Bowel sounds are normal. She exhibits no distension. There is no tenderness. There is no guarding.  Genitourinary:        Musculoskeletal: She exhibits no edema or tenderness.  Neurological: She is alert and oriented to person, place, and time. She displays abnormal  reflex. She exhibits abnormal muscle tone. Coordination abnormal.  Flat but speech clear and able to follow commands without difficulty.  BUE 5/5 proximal to distal.  RLE: hip flexion, knee extension 2+/5, ankle dorsi/plantar flexion 3+/5 LLE: 2-/5 proximal to distal  Extensor tone---more prominent in supine (2/4). 1/4 in sitting    DTR 3+ b/l LE  Skin: Skin is warm and dry.  Upper thoracic incision clean/sutures/fairly dry Psychiatric: Her speech is normal and behavior is normal. Judgment and thought content normal. Her affect is blunt. Cognition and memory are normal.   Assessment/Plan: 1. 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 Physiatrist is providing close team supervision and 24 hour management of active medical problems listed below. Physiatrist and rehab team continue to assess barriers to discharge/monitor patient progress toward functional and medical goals.  Function:  Bathing Bathing position Bathing activity did not occur: Refused Position: Sitting EOB (Buttock/ perineal area cmopleted in supine)  Bathing parts Body parts bathed by patient: Right arm, Left arm, Chest, Abdomen, Right upper leg, Left upper leg, Right lower leg, Left lower leg, Front perineal area, Buttocks Body parts bathed by helper: Back  Bathing assist Assist Level: Set up, Supervision or verbal cues   Set up : To obtain items  Upper Body Dressing/Undressing Upper body dressing   What is the patient wearing?: Pull over shirt/dress, Bra Bra - Perfomed by patient: Thread/unthread right bra strap, Thread/unthread left bra strap, Hook/unhook bra (pull down sports bra) Bra -  Perfomed by helper: Hook/unhook bra (pull down sports bra) Pull over shirt/dress - Perfomed by patient: Thread/unthread right sleeve, Thread/unthread left sleeve, Put head through opening, Pull shirt over  trunk Pull over shirt/dress - Perfomed by helper: Pull shirt over trunk        Upper body assist Assist Level: Set up, Supervision or verbal cues   Set up : To obtain clothing/put away  Lower Body Dressing/Undressing Lower body dressing Lower body dressing/undressing activity did not occur: Refused What is the patient wearing?: Pants, Shoes     Pants- Performed by patient: Thread/unthread right pants leg, Thread/unthread left pants leg, Pull pants up/down Pants- Performed by helper: Pull pants up/down   Non-skid slipper socks- Performed by helper: Don/doff right sock, Don/doff left sock     Shoes - Performed by patient: Don/doff right shoe, Don/doff left shoe Shoes - Performed by helper: Don/doff right shoe, Don/doff left shoe, Fasten right, Fasten left          Lower body assist Assist for lower body dressing: Touching or steadying assistance (Pt > 75%)      Toileting Toileting Toileting activity did not occur: No continent bowel/bladder event Toileting steps completed by patient: Adjust clothing prior to toileting, Performs perineal hygiene, Adjust clothing after toileting Toileting steps completed by helper: Adjust clothing prior to toileting, Adjust clothing after toileting Toileting Assistive Devices: Grab bar or rail  Toileting assist Assist level: Touching or steadying assistance (Pt.75%)   Transfers Chair/bed transfer Chair/bed transfer activity did not occur: Safety/medical concerns (Pt unable to successfuly complete attempt) Chair/bed transfer method: Lateral scoot Chair/bed transfer assist level: Touching or steadying assistance (Pt > 75%) Chair/bed transfer assistive device: Sliding board, Armrests     Locomotion Ambulation Ambulation activity did not occur: N/A (WC level)         Wheelchair Wheelchair activity did not occur: Safety/medical concerns Type: Motorized Max wheelchair distance: 500' Assist Level: No help, No cues, assistive device, takes more  than reasonable amount of time  Cognition Comprehension Comprehension assist level: Follows complex conversation/direction with no assist  Expression Expression assist level: Expresses complex ideas: With no assist  Social Interaction Social Interaction assist level: Interacts appropriately with others - No medications needed.  Problem Solving Problem solving assist level: Solves complex problems: Recognizes & self-corrects  Memory Memory assist level: Complete Independence: No helper    Medical Problem List and Plan: 1. 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 H/o PE/Anticoagulation: Pharmaceutical: Xarelto, No signs of bleeding 3. Pain Management: Controlled currently on oxycodone prn.  -treat as needed 4. Mood: LCSW to follow for evaluation and support. Ego support provided. She is aware of hard work ahead of her but has good social supports at home.  5. Neuropsych: This patient is capable of making decisions on her own behalf. 6. Skin/Wound Care: Routine pressure relief measures. Continue to monitor thoracic incision for healing as well as skin due to bowel incontinence.  7. Fluids/Electrolytes/Nutrition: Monitor I/O. Appetite has been good so far.   8.Metastatic cancer with T3 pathologic FX with cord compression and new osseous mets T12- L3/S1:   -has appt at [facility] on 5/9---working on alternative plan 9. Steroid induced Hyperglycemia: Blood sugars improving with decadron taper. Will monitor BID for now.  CBG (last 3)   Recent Labs  07/26/15 0602 07/26/15 1645 07/27/15 0612  GLUCAP 128* 125* 107*    10. HTN:  Continue to monitor BP bid--off prinivil at this time as blood pressures controlled. May need to be resumed as activity level improves  11. Spasticity: Continue baclofen 20 mg 5 X day with valium prn. Well controlled at present  -  zanaflex  now at '2mg'$  tid and '4mg'$  qhs --this has been effective    12. ABLA: hgb up to 12.7  13. Leucocytosis: Likely steroid induced.  afebrile 14. 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 H/o depression: Resume home dose Zoloft. 15. Neurogenic bowel : changed to pm bowel program per pt preference  -colace in am  -enema prn 16. Neurogenic bladder:  Beginning to void. I/o cath prn  -e coli UTI. macrobid '100mg'$  bid  -  urecholine to '25mg'$  TID, timed voids  -needs to be up on [facility] or toilet to empty  LOS (Days) 16 A FACE TO FACE EVALUATION WAS PERFORMED  Shelagh Rayman T 07/27/2015 8:26 AM

## 2015-07-28 ENCOUNTER — Inpatient Hospital Stay (HOSPITAL_COMMUNITY): Payer: BLUE CROSS/BLUE SHIELD | Admitting: Occupational Therapy

## 2015-07-28 DIAGNOSIS — M62838 Other muscle spasm: Secondary | ICD-10-CM | POA: Insufficient documentation

## 2015-07-28 DIAGNOSIS — M6249 Contracture of muscle, multiple sites: Secondary | ICD-10-CM

## 2015-07-28 LAB — GLUCOSE, CAPILLARY
GLUCOSE-CAPILLARY: 104 mg/dL — AB (ref 65–99)
GLUCOSE-CAPILLARY: 178 mg/dL — AB (ref 65–99)

## 2015-07-28 NOTE — Progress Notes (Signed)
[place] [facility] MEDIC[facility] & RE[facility]ABILITATION     PROGRESS NOTE    Subjective/Complaints: Patient lying in bed this morning. She states she slept well overnight.  ROS: Denies CP, SOB, nausea, vomiting, diarrhea.   Objective: Vital Signs: Blood pressure 112/75, pulse 57, temperature 97.7 F (36.5 C), temperature source Oral, resp. rate 18, height '5\' 11"'$  (1.803 m), weight 112.492 kg (248 lb), last menstrual period 06/06/2015, SpO2 100 %. No results found. No results for input(s): WBC, HGB, HCT, PLT in the last 72 hours. No results for input(s): NA, K, CL, GLUCOSE, BUN, CREATININE, CALCIUM in the last 72 hours.  Invalid input(s): CO CBG (last 3)   Recent Labs  07/27/15 0612 07/27/15 1718 07/28/15 0603  GLUCAP 107* 161* 104*    Wt Readings from Last 3 Encounters:  07/19/15 112.492 kg (248 lb)  07/06/15 117.3 kg (258 lb 9.6 oz)  04/20/14 127.007 kg (280 lb)    Physical Exam:  Constitutional: She appears well-developed. NAD. Obese   HENT: Normocephalic and atraumatic.  Eyes: Conjunctivae and EOM are normal.  Cardiovascular: Normal rate and regular rhythm.No murmur heard. Respiratory: Effort normal. No stridor. She has no wheezes.  GI: Soft. Bowel sounds are normal. She exhibits no distension. There is no tenderness.  Musculoskeletal: She exhibits no edema or tenderness.  Neurological: She is alert and oriented.  Flat but speech clear and able to follow commands without difficulty.  BUE 5/5 proximal to distal.  RLE: hip flexion, knee extension 3-/5, ankle dorsi/plantar flexion 4/5 LLE: 2/5 proximal to distal  Extensor tone---more prominent in supine (2/4). 1/4 in sitting Skin: Skin is warm and dry.  Upper thoracic incision clean/sutures/fairly dry Psychiatric: Her speech is normal and behavior is normal. Judgment and thought content normal. Cognition and memory are normal.   Assessment/Plan: 1. 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 Physiatrist is providing close team supervision and 24 hour management of active medical problems listed below. Physiatrist and rehab team continue to assess barriers to discharge/monitor patient progress toward functional and medical goals.  Function:  Bathing Bathing position Bathing activity did not occur: Refused Position: Sitting EOB  Bathing parts Body parts bathed by patient: Right arm, Left arm, Chest, Abdomen, Right upper leg, Left upper leg, Right lower leg, Left lower leg Body parts bathed by helper: Back  Bathing assist Assist Level: Set up, Supervision or verbal cues   Set up : To obtain items  Upper Body Dressing/Undressing Upper body dressing   What is the patient wearing?: Pull over shirt/dress, Bra Bra - Perfomed by patient: Thread/unthread right bra strap, Thread/unthread left bra strap, Hook/unhook bra (pull down sports bra) Bra - Perfomed by helper: Hook/unhook bra (pull down sports bra) Pull over shirt/dress - Perfomed by patient: Thread/unthread right sleeve, Thread/unthread left sleeve, Put head through opening, Pull shirt over trunk Pull over shirt/dress - Perfomed by helper: Pull shirt over trunk        Upper body assist Assist Level: Set up, Supervision or verbal cues   Set up : To obtain clothing/put away  Lower Body Dressing/Undressing Lower body dressing Lower body dressing/undressing activity did not occur: Refused What is the patient wearing?: Pants     Pants- Performed by patient: Thread/unthread right pants leg, Thread/unthread left pants leg, Pull pants up/down Pants- Performed by helper: Thread/unthread right pants leg, Thread/unthread left pants leg, Pull pants up/down   Non-skid slipper socks-  Performed by helper: Don/doff right sock, Don/doff left sock     Shoes - Performed by patient: Don/doff right shoe, Don/doff left shoe Shoes -  Performed by helper: Don/doff right shoe, Don/doff left shoe, Fasten right, Fasten left          Lower body assist Assist for lower body dressing: 2 Helpers      Naval architect activity did not occur: No continent bowel/bladder event Toileting steps completed by patient: Adjust clothing prior to toileting, Performs perineal hygiene Toileting steps completed by helper: Adjust clothing after toileting Toileting Assistive Devices: Grab bar or rail  Toileting assist Assist level: Touching or steadying assistance (Pt.75%)   Transfers Chair/bed transfer Chair/bed transfer activity did not occur: Safety/medical concerns (Pt unable to successfuly complete attempt) Chair/bed transfer method: Lateral scoot Chair/bed transfer assist level: Touching or steadying assistance (Pt > 75%) Chair/bed transfer assistive device: Sliding board, Armrests     Locomotion Ambulation Ambulation activity did not occur: N/A (WC level)         Wheelchair Wheelchair activity did not occur: Safety/medical concerns Type: Motorized Max wheelchair distance: 200' Assist Level: No help, No cues, assistive device, takes more than reasonable amount of time  Cognition Comprehension Comprehension assist level: Follows complex conversation/direction with no assist  Expression Expression assist level: Expresses complex ideas: With no assist  Social Interaction Social Interaction assist level: Interacts appropriately with others - No medications needed.  Problem Solving Problem solving assist level: Solves complex problems: Recognizes & self-corrects  Memory Memory assist level: Complete Independence: No helper    Medical Problem List and Plan: 1. 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 H/o PE/Anticoagulation: Pharmaceutical: Xarelto, No signs of bleeding 3. Pain Management:  Controlled currently on oxycodone prn.  -treat as needed 4. Mood: LCSW to follow for evaluation and support. Ego support provided. She is aware of hard work ahead of her but has good social supports at home.  5. Neuropsych: This patient iscapable of making decisions on her own behalf. 6. Skin/Wound Care: Routine pressure relief measures. Continue to monitor thoracic incision for healing as well as skin due to bowel incontinence.  7. Fluids/Electrolytes/Nutrition: Monitor I/O. Appetite has been good so far.   8. Metastatic cancer with T3 pathologic FX with cord compression and new osseous mets T12- L3/S1:   -has appt at [facility] on 5/9---working on alternative plan 9. Steroid induced Hyperglycemia: Blood sugars improving with decadron taper. Will monitor BID for now.  CBG (last 3)   Recent Labs  07/27/15 0612 07/27/15 1718 07/28/15 0603  GLUCAP 107* 161* 104*    10. HTN: Continue to monitor--off prinivil at this time as blood pressures controlled. May need to be resumed as activity level improves  11. Spasticity: Continue baclofen 20 mg 5 X day with valium prn. Well controlled at present  -  zanaflex now at '2mg'$  tid and '4mg'$  qhs --this has been effective 12. ABLA: hgb up to 12.7  13. Leucocytosis: Likely steroid induced.  afebrile 14. 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 H/o depression: Resume home dose Zoloft. 15. Neurogenic bowel : changed to pm bowel program per pt preference  -colace in am  -enema prn 16. Neurogenic bladder:  Beginning to void. I/o cath prn  -e coli UTI. macrobid '100mg'$  bid  -  urecholine to '25mg'$  TID, timed voids  -needs to be up on [facility] or  toilet to empty  LOS (Days) 17 A FACE TO FACE EVALUATION WAS PERFORMED  Therese Rocco Lorie Phenix 07/28/2015 8:10 AM

## 2015-07-28 NOTE — Progress Notes (Addendum)
Occupational Therapy Session Note  Patient Details  Name: Sarah [name] MRN: [id number] Date of Birth: 1965-05-19  Today's Date: 07/28/2015 OT Individual Time: 7893-8101 OT Individual Time Calculation (min): 44 min    Short Term Goals: Week 3:  OT Short Term Goal 1 (Week 3): STG=LTG due to LOS  Skilled Therapeutic Interventions/Progress Updates:    Upon entering the room, pt seated in wheelchair awaiting therapist. Pt with no c/o pain this session and agreeable to OT intervention.  Pt utilized motorized wheelchair with no assistance needed. Pt performing set up of wheelchair in public restroom. Min A needed for placement of slide board. Pt became very anxious and fearful of falling secondary to unsteady surface. OT provided strong encouragment and pt getting halfway onto toilet before refusing to go further secondary to being fearful. Pt returning to wheelchair via slide board with steady assist. OT provided pt education on relaxation techniques, problem solving for public restroom transfers, and discussion of safety with community mobility. Pt verbalized understanding but education to continue. Pt agreeable to attempt transfer again next session. Pt returning to room with call bell and all needed items within reach upon exiting the room.   Therapy Documentation Precautions:  Precautions Precautions: Back, Fall Precaution Booklet Issued: No Precaution Comments: Reviewed and performed log roll technique for supine<>sit Restrictions Weight Bearing Restrictions: No  See Function Navigator for Current Functional Status.   Therapy/Group: Individual Therapy  Phineas Semen 07/28/2015, 11:30 AM

## 2015-07-29 LAB — GLUCOSE, CAPILLARY
GLUCOSE-CAPILLARY: 112 mg/dL — AB (ref 65–99)
Glucose-Capillary: 153 mg/dL — ABNORMAL HIGH (ref 65–99)

## 2015-07-29 NOTE — Progress Notes (Signed)
Sarah [name] PHYSICAL MEDICINE & REHABILITATION     PROGRESS NOTE    Subjective/Complaints: Patient sitting comfortably in bed this morning. She states he needs his to bedpan.  ROS: Denies CP, SOB, nausea, vomiting, diarrhea.   Objective: Vital Signs: Blood pressure 113/61, pulse 58, temperature 97.8 F (36.6 C), temperature source Oral, resp. rate 18, height '5\' 11"'$  (1.803 m), weight 112.492 kg (248 lb), last menstrual period 06/06/2015, SpO2 100 %. No results found. No results for input(s): WBC, HGB, HCT, PLT in the last 72 hours. No results for input(s): NA, K, CL, GLUCOSE, BUN, CREATININE, CALCIUM in the last 72 hours.  Invalid input(s): CO CBG (last 3)   Recent Labs  07/28/15 0603 07/28/15 1701 07/29/15 0603  GLUCAP 104* 178* 112*    Wt Readings from Last 3 Encounters:  07/19/15 112.492 kg (248 lb)  07/06/15 117.3 kg (258 lb 9.6 oz)  04/20/14 127.007 kg (280 lb)    Physical Exam:  Constitutional: She appears well-developed. NAD. Obese   HENT: Normocephalic and atraumatic.  Eyes: Conjunctivae and EOM are normal.  Cardiovascular: Normal rate and regular rhythm.No murmur heard. Respiratory: Effort normal. No stridor. She has no wheezes.  GI: Soft. Bowel sounds are normal. She exhibits no distension. There is no tenderness.  Musculoskeletal: She exhibits no edema or tenderness.  Neurological: She is alert and oriented.  Flat but speech clear and able to follow commands without difficulty.  BUE 5/5 proximal to distal.  RLE: hip flexion, knee extension 3-/5, ankle dorsi/plantar flexion 4/5 LLE: 2/5 proximal to distal  Extensor tone in lower extremities Skin: Skin is warm and dry.  Upper thoracic incision clean/sutures/fairly dry Psychiatric: Her speech is normal and behavior is normal. Judgment and thought content normal. Cognition and memory are normal.   Assessment/Plan: 1. 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 Physiatrist is providing close team supervision and 24 hour management of active medical problems listed below. Physiatrist and rehab team continue to assess barriers to discharge/monitor patient progress toward functional and medical goals.  Function:  Bathing Bathing position Bathing activity did not occur: Refused Position: Sitting EOB  Bathing parts Body parts bathed by patient: Right arm, Left arm, Chest, Abdomen, Right upper leg, Left upper leg, Right lower leg, Left lower leg Body parts bathed by helper: Back  Bathing assist Assist Level: Set up, Supervision or verbal cues   Set up : To obtain items  Upper Body Dressing/Undressing Upper body dressing   What is the patient wearing?: Pull over shirt/dress, Bra Bra - Perfomed by patient: Thread/unthread right bra strap, Thread/unthread left bra strap, Hook/unhook bra (pull down sports bra) Bra - Perfomed by helper: Hook/unhook bra (pull down sports bra) Pull over shirt/dress - Perfomed by patient: Thread/unthread right sleeve, Thread/unthread left sleeve, Put head through opening, Pull shirt over trunk Pull over shirt/dress - Perfomed by helper: Pull shirt over trunk        Upper body assist Assist Level: Set up, Supervision or verbal cues   Set up : To obtain clothing/put away  Lower Body Dressing/Undressing Lower body dressing Lower body dressing/undressing activity did not occur: Refused What is the patient wearing?: Pants     Pants- Performed by patient: Thread/unthread right pants leg, Thread/unthread left pants leg, Pull pants up/down Pants- Performed by helper: Thread/unthread right pants leg, Thread/unthread left pants leg, Pull pants up/down   Non-skid slipper socks- Performed by  helper: Don/doff right sock, Don/doff left sock     Shoes - Performed by patient: Don/doff right shoe, Don/doff left shoe Shoes - Performed by helper:  Don/doff right shoe, Don/doff left shoe, Fasten right, Fasten left          Lower body assist Assist for lower body dressing: 2 Helpers      Naval architect activity did not occur: No continent bowel/bladder event Toileting steps completed by patient: Adjust clothing prior to toileting, Performs perineal hygiene Toileting steps completed by helper: Adjust clothing after toileting Toileting Assistive Devices: Grab bar or rail  Toileting assist Assist level: Touching or steadying assistance (Pt.75%)   Transfers Chair/bed transfer Chair/bed transfer activity did not occur: Safety/medical concerns (Pt unable to successfuly complete attempt) Chair/bed transfer method: Lateral scoot Chair/bed transfer assist level: Touching or steadying assistance (Pt > 75%) Chair/bed transfer assistive device: Sliding board, Armrests     Locomotion Ambulation Ambulation activity did not occur: N/A (WC level)         Wheelchair Wheelchair activity did not occur: Safety/medical concerns Type: Motorized Max wheelchair distance: 200' Assist Level: No help, No cues, assistive device, takes more than reasonable amount of time  Cognition Comprehension Comprehension assist level: Follows complex conversation/direction with no assist  Expression Expression assist level: Expresses complex ideas: With no assist  Social Interaction Social Interaction assist level: Interacts appropriately with others - No medications needed.  Problem Solving Problem solving assist level: Solves complex problems: Recognizes & self-corrects  Memory Memory assist level: Complete Independence: No helper    Medical Problem List and Plan: 1. 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 H/o PE/Anticoagulation: Pharmaceutical: Xarelto,  3. Pain Management: Controlled currently on oxycodone  prn.  -treat as needed 4. Mood: LCSW to follow for evaluation and support. Ego support provided. She is aware of hard work ahead of her but has good social supports at home.  5. Neuropsych: This patient iscapable of making decisions on her own behalf. 6. Skin/Wound Care: Routine pressure relief measures. Continue to monitor thoracic incision for healing as well as skin due to bowel incontinence.  7. Fluids/Electrolytes/Nutrition: Monitor I/O. Appetite has been good so far.   8. Metastatic cancer with T3 pathologic FX with cord compression and new osseous mets T12- L3/S1:   -has appt at [facility] on 5/9---working on alternative plan 9. Steroid induced Hyperglycemia: Blood sugars improving with decadron taper. Will monitor BID for now.  CBG (last 3)   Recent Labs  07/28/15 0603 07/28/15 1701 07/29/15 0603  GLUCAP 104* 178* 112*    10. HTN: Continue to monitor--off prinivil at this time as blood pressures controlled. 11. Spasticity: Continue baclofen 20 mg 5 X day with valium prn. Well controlled at present  -  zanaflex now at '2mg'$  tid and '4mg'$  qhs --this has been effective 12. ABLA: hgb up to 12.7  13. Leucocytosis: Likely steroid induced.  afebrile 14. 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 H/o depression: Resume home dose Zoloft. 15. Neurogenic bowel : changed to pm bowel program per pt preference  -colace in am  -enema prn 16. Neurogenic bladder:  Beginning to void. I/o cath prn  -e coli UTI. macrobid '100mg'$  bid  -  urecholine to '25mg'$  TID, timed voids  -needs to be up on BSC or toilet to empty  LOS (Days) 18 A FACE TO FACE EVALUATION WAS PERFORMED  Ankit Lorie Phenix 07/29/2015 7:15 AM

## 2015-07-30 ENCOUNTER — Inpatient Hospital Stay (HOSPITAL_COMMUNITY): Payer: BLUE CROSS/BLUE SHIELD | Admitting: Occupational Therapy

## 2015-07-30 ENCOUNTER — Inpatient Hospital Stay (HOSPITAL_COMMUNITY): Payer: BLUE CROSS/BLUE SHIELD

## 2015-07-30 ENCOUNTER — Inpatient Hospital Stay (HOSPITAL_COMMUNITY): Payer: BLUE CROSS/BLUE SHIELD | Admitting: *Deleted

## 2015-07-30 LAB — GLUCOSE, CAPILLARY
GLUCOSE-CAPILLARY: 123 mg/dL — AB (ref 65–99)
Glucose-Capillary: 185 mg/dL — ABNORMAL HIGH (ref 65–99)

## 2015-07-30 NOTE — Progress Notes (Signed)
Occupational Therapy Session Note  Patient Details  Name: Sarah [name] MRN: [id number] Date of Birth: 08/16/1965  Today's Date: 07/30/2015 OT Individual Time: 0814-4818 and 1300-1400 OT Individual Time Calculation (min): 60 min and 60 min    Short Term Goals: Week 3:  OT Short Term Goal 1 (Week 3): STG=LTG due to LOS  Skilled Therapeutic Interventions/Progress Updates:    Session One: Pt seen for OT ADL bathing/dressing session. Pt in supine upon arrival, agreeable to tx session. She voiced 6/10 pain in back, RN made aware for medication to be administered. She transferred to EOB with min A for management of L LE off EOB. She bathed with set-up seated EOB using LH sponge to assist for LB bathing. She dressed with set-up and supervision on EOB using reacher and lateral leans to don pants with min A to get pants all the way up in the back. . Max A required for donning shoes.  She completed min A sliding board transfer into w/c with assist to place and remove board and to stabilize equipment. She completed grooming tasks at sink mod I in ADL apartment. Pt returned to room at end of session, left with all needs in reach.  She voiced feeling ready for planned d/c home on Wednesday though still nervous of how everything will go. She knows she is much higher level and independent now compared to when she left CIR 2 years ago and knows family is able to provide needed assist at d/c.  Discussed with pt possible option of using hand held female urinal for toileting tasks when out in community. Pt open to trying. Will cont to practice with urinal at next tx session.   Session Two: Pt seen for OT session focusing on functional transfers and lateral leans. Pt sitting on BSC upon arrival, agreeable to tx session. She required assist to pull pants up following lateral leans and min A sliding board transfer completed to power w/c. She declined working on positioning of hand held urinal and managing pants from  w/c level, wishing to work on that when she wasn't so fatigued. We discussed positioning in chair and chair functions to assist with getting pt in a sitting position that would allow the use of hand held urinal while in chair.  In therapy gym, pt completed uphill sliding board transfer with min-mod A for uneven transfer in simulation of community/ home transfer surface. While on mat, pt completed lateral leans x10 to the R and x10 to the L leaning onto theraball in simulation of LB dressing techniques. She completed transfer back to chair in same manner as described above. Pt returned to room and left sitting in chair with all needs in reach.   Therapy Documentation Precautions:  Precautions Precautions: Back, Fall Precaution Booklet Issued: No Precaution Comments: Reviewed and performed log roll technique for supine<>sit Restrictions Weight Bearing Restrictions: No Pain: Pain Assessment Pain Score: 6  Pain Location: Back Pain Descriptors / Indicators: Aching Pain Intervention(s): Repositioned;Medication (See eMAR);RN made aware;Ambulation/increased activity  See Function Navigator for Current Functional Status.   Therapy/Group: Individual Therapy  Lewis, Matthan Sledge C 07/30/2015, 7:09 AM

## 2015-07-30 NOTE — Progress Notes (Signed)
[place] PHYSICAL MEDICINE & REHABILITATION     PROGRESS NOTE    Subjective/Complaints: Had a good weekend. Pain controlled. Emptying bladder  ROS: Denies CP, SOB, nausea, vomiting, diarrhea.   Objective: Vital Signs: Blood pressure 103/66, pulse 57, temperature 97.9 F (36.6 C), temperature source Oral, resp. rate 18, height '5\' 11"'$  (1.803 m), weight 112.492 kg (248 lb), last menstrual period 06/06/2015, SpO2 100 %. No results found. No results for input(s): WBC, HGB, HCT, PLT in the last 72 hours. No results for input(s): NA, K, CL, GLUCOSE, BUN, CREATININE, CALCIUM in the last 72 hours.  Invalid input(s): CO CBG (last 3)   Recent Labs  07/29/15 0603 07/29/15 1650 07/30/15 0555  GLUCAP 112* 153* 123*    Wt Readings from Last 3 Encounters:  07/19/15 112.492 kg (248 lb)  07/06/15 117.3 kg (258 lb 9.6 oz)  04/20/14 127.007 kg (280 lb)    Physical Exam:  Constitutional: She appears well-developed. NAD. Obese   HENT: Normocephalic and atraumatic.  Eyes: Conjunctivae and EOM are normal.  Cardiovascular: Normal rate and regular rhythm.No murmur heard. Respiratory: Effort normal. No stridor. She has no wheezes.  GI: Soft. Bowel sounds are normal. She exhibits no distension. There is no tenderness.  Musculoskeletal: She exhibits no edema or tenderness.  Neurological: She is alert and oriented.  Flat but speech clear and able to follow commands without difficulty.  BUE 5/5 proximal to distal.  RLE: hip flexion, knee extension 3-/5, ankle dorsi/plantar flexion 4/5 LLE: 2/5 proximal to distal  Extensor tone in lower extremities Skin: Skin is warm and dry.  Upper thoracic incision clean/sutures. Still some slight serous drainage. Psychiatric: Her speech is normal and behavior is normal. Judgment and thought content normal. Cognition and memory are normal.   Assessment/Plan: 1. 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 Physiatrist is providing close team supervision and 24 hour management of active medical problems listed below. Physiatrist and rehab team continue to assess barriers to discharge/monitor patient progress toward functional and medical goals.  Function:  Bathing Bathing position Bathing activity did not occur: Refused Position: Sitting EOB  Bathing parts Body parts bathed by patient: Right arm, Left arm, Chest, Abdomen, Right upper leg, Left upper leg, Right lower leg, Left lower leg Body parts bathed by helper: Back  Bathing assist Assist Level: Set up, Supervision or verbal cues   Set up : To obtain items  Upper Body Dressing/Undressing Upper body dressing   What is the patient wearing?: Pull over shirt/dress, Bra Bra - Perfomed by patient: Thread/unthread right bra strap, Thread/unthread left bra strap, Hook/unhook bra (pull down sports bra) Bra - Perfomed by helper: Hook/unhook bra (pull down sports bra) Pull over shirt/dress - Perfomed by patient: Thread/unthread right sleeve, Thread/unthread left sleeve, Put head through opening, Pull shirt over trunk Pull over shirt/dress - Perfomed by helper: Pull shirt over trunk        Upper body assist Assist Level: Set up, Supervision or verbal cues   Set up : To obtain clothing/put away  Lower Body Dressing/Undressing Lower body dressing Lower body dressing/undressing activity did not occur: Refused What is the patient wearing?: Pants, Shoes     Pants- Performed by patient: Thread/unthread right pants leg, Thread/unthread left pants leg, Pull pants up/down Pants- Performed by helper: Thread/unthread right pants leg, Thread/unthread left pants leg, Pull pants up/down   Non-skid slipper socks- Performed by helper:  Don/doff right sock, Don/doff left sock     Shoes - Performed by patient: Don/doff right shoe, Don/doff left shoe Shoes - Performed by helper:  Don/doff right shoe, Don/doff left shoe, Fasten right, Fasten left          Lower body assist Assist for lower body dressing: Touching or steadying assistance (Pt > 75%)      Toileting Toileting Toileting activity did not occur: No continent bowel/bladder event Toileting steps completed by patient: Adjust clothing prior to toileting, Performs perineal hygiene Toileting steps completed by helper: Adjust clothing after toileting Toileting Assistive Devices: Grab bar or rail  Toileting assist Assist level: Touching or steadying assistance (Pt.75%)   Transfers Chair/bed transfer Chair/bed transfer activity did not occur: Safety/medical concerns (Pt unable to successfuly complete attempt) Chair/bed transfer method: Lateral scoot Chair/bed transfer assist level: Moderate assist (Pt 50 - 74%/lift or lower) Chair/bed transfer assistive device: Sliding board, Armrests     Locomotion Ambulation Ambulation activity did not occur: N/A (WC level)         Wheelchair Wheelchair activity did not occur: Safety/medical concerns Type: Motorized Max wheelchair distance: 200' Assist Level: No help, No cues, assistive device, takes more than reasonable amount of time  Cognition Comprehension Comprehension assist level: Follows complex conversation/direction with no assist  Expression Expression assist level: Expresses complex ideas: With no assist  Social Interaction Social Interaction assist level: Interacts appropriately with others - No medications needed.  Problem Solving Problem solving assist level: Solves complex problems: Recognizes & self-corrects  Memory Memory assist level: Complete Independence: No helper    Medical Problem List and Plan: 1. 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 H/o PE/Anticoagulation: Pharmaceutical:  Xarelto,  3. Pain Management: Controlled currently on oxycodone prn.  -treat as needed 4. Mood: LCSW to follow for evaluation and support. Ego support provided. She is aware of hard work ahead of her but has good social supports at home.  5. Neuropsych: This patient iscapable of making decisions on her own behalf. 6. Skin/Wound Care: continue dry dressing to back incision.  7. Fluids/Electrolytes/Nutrition: Monitor I/O. Appetite has been good so far.   8. Metastatic cancer with T3 pathologic FX with cord compression and new osseous mets T12- L3/S1:   -has appt at [facility] on 5/9---working on alternative plan 9. Steroid induced Hyperglycemia: Blood sugars improving with decadron taper. Will monitor BID for now.  CBG (last 3)   Recent Labs  07/29/15 0603 07/29/15 1650 07/30/15 0555  GLUCAP 112* 153* 123*    10. HTN: Continue to monitor--off prinivil at this time as blood pressures controlled. 11. Spasticity: Continue baclofen 20 mg 5 X day with valium prn. Well controlled at present  -  zanaflex now at '2mg'$  tid and '4mg'$  qhs --this has been effective 12. ABLA: hgb up to 12.7  13. Leucocytosis: Likely steroid induced.  afebrile 14. 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 H/o depression: Resume home dose Zoloft. 15. Neurogenic bowel : changed to pm bowel program per pt preference  -colace in am    16. Neurogenic bladder:  Voiding much improved  -e coli UTI. macrobid '100mg'$  bid  -  urecholine '25mg'$  TID, timed voids  -needs to be up on BSC or toilet to empty  LOS (Days) 19 A FACE TO FACE EVALUATION WAS PERFORMED  Sarah [name] T 07/30/2015 8:38 AM

## 2015-07-30 NOTE — Progress Notes (Signed)
Physical Therapy Session Note  Patient Details  Name: [name] MRN: [id number] Date of Birth: [birth year]  Today's Date: 07/30/2015 PT Individual Time: 1030-1230 PT Individual Time Calculation (min): 120 min   Short Term Goals: Week 3:  PT Short Term Goal 1 (Week 3): = LTGs with D/c tentative 5/10 at min assist w/c level  Skilled Therapeutic Interventions/Progress Updates:   Pt participated in community reintegration/outing to [employer] at [employer] I power w/c level. Goals focused on directing her care, problem solving public restroom access, safe mobility in community environments including obstacle negotiation & energy conservation.Education provided on power w/c safety in community (inclines). See outing goal sheet in shadow chart for full details.  Therapy Documentation Precautions:  Precautions Precautions: Back, Fall Precaution Booklet Issued: No Precaution Comments: Reviewed and performed log roll technique for supine<>sit Restrictions Weight Bearing Restrictions: No    See Function Navigator for Current Functional Status.   Therapy/Group: Individual Therapy and Community Re-integration  [name], PT, DPT  07/30/2015, 3:10 PM

## 2015-07-30 NOTE — Progress Notes (Signed)
Recreational Therapy Session Note  Patient Details  Name: Sarah [name] MRN: [id number] Date of Birth: [birth year] Today's Date: 07/30/2015  Pain: no c/o Skilled Therapeutic Interventions/Progress Updates: Pt participated in community reintegration/outing to [employer] at [employer] I power w/c level. Goals focused on directing her care, problem solving public restroom access, safe mobility in community environments including obstacle negotiation & energy conservation.  See outing goal sheet in shadow chart for full details.   Therapy/Group: Co-Treatment   Harshini Trent 07/30/2015, 2:35 PM

## 2015-07-31 ENCOUNTER — Inpatient Hospital Stay (HOSPITAL_COMMUNITY): Payer: BLUE CROSS/BLUE SHIELD

## 2015-07-31 ENCOUNTER — Inpatient Hospital Stay (HOSPITAL_COMMUNITY): Payer: BLUE CROSS/BLUE SHIELD | Admitting: Occupational Therapy

## 2015-07-31 LAB — GLUCOSE, CAPILLARY
Glucose-Capillary: 107 mg/dL — ABNORMAL HIGH (ref 65–99)
Glucose-Capillary: 169 mg/dL — ABNORMAL HIGH (ref 65–99)

## 2015-07-31 NOTE — Progress Notes (Signed)
Occupational Therapy Session Note  Patient Details  Name: ERCELL PERLMAN MRN: [id number] Date of Birth: 1965-06-23  Today's Date: 07/31/2015 OT Individual Time: 0254-2706 OT Individual Time Calculation (min): 60 min    Short Term Goals: Week 3:  OT Short Term Goal 1 (Week 3): STG=LTG due to LOS  Skilled Therapeutic Interventions/Progress Updates:    Pt seen for OTADL bathing/ dressing session. Pt in supine upon arrival with RN present administering morning meds. She transferred to EOB with min- mod A for management of LE and usingbed pad to shift hips. She completed bathing/dressing seated EOB with set-up assist and use of AE.  She transferred to power w/c using sliding board with assist to place and move board and VCs for LE management.  She completed sliding board transfer to drop arm BSC in same manner as described above and lateral leans completed to remove pants. Pt able to complete hygiene from front approach. Upon leaning to pull pants back up, pt with loose stool. She desired to remain sitting on BSC to see if she needed to go any further. Pt left sitting on BSC with call bell in reach. RN made aware of pt's position.  Discussed with pt toileting at night as pt has to go every 3-4 hours.Pt currently using bed pan and said she might cont with that at home. Also discussed using handheld urinal in bed as to reduce caregiver burden or wearing long nightgowns so she will not be completing "barebutt" sliding board transfers to drop arm BSC.  Therapy Documentation Precautions:  Precautions Precautions: Back, Fall Precaution Booklet Issued: No Precaution Comments: Reviewed and performed log roll technique for supine<>sit Restrictions Weight Bearing Restrictions: No Pain:   No/ denies pain  See Function Navigator for Current Functional Status.   Therapy/Group: Individual Therapy  Lewis, Overton Boggus C 07/31/2015, 7:08 AM

## 2015-07-31 NOTE — Progress Notes (Signed)
Recreational Therapy Discharge Summary Patient Details  Name: Sarah [name] MRN: [id number] Date of Birth: 04/03/1965 Today's Date: 07/31/2015  Long term goals set: 1  Long term goals met: 1  Comments on progress toward goals: Pt has made excellent progress during LOS and is ready for discharge home with family at [employer] [place] power w/c level.  Education provided on energy conservation, activity modifications/adaptations, use of leisure time, & community reintegration.     Reasons for discharge: discharge from hospital Patient/family agrees with progress made and goals achieved: Yes  [name] 07/31/2015, 8:22 AM

## 2015-07-31 NOTE — Progress Notes (Signed)
Occupational Therapy Discharge Summary  Patient Details  Name: Sarah [name] MRN: [id number] Date of Birth: [birth year]   Patient has met 7 of 7 long term goals due to improved activity tolerance, improved balance and postural control.  Patient to discharge at [facility] Assist level.  Patient's care partner is independent to provide the necessary physical assistance at discharge.    Pt made wonderful progress during CIR stay. Pt currently completing bathing/dressing with set-up and supervision assist from EOB. She requires increased assist with LB dressing (shoes) due to increased tone affecting her ability to bring LE up closer and with spinal precautions limiting her ability to lean forward to access. All sliding board transfers completed with overall min A with occasional mod A for uneven transfers.  Pt and family voice readiness and ability to provide proper assistance at d/c as they were helping her PTA using sliding board and with ADL/IADLs.   Recommendation:  Patient will benefit from ongoing skilled OT services in home health setting to continue to advance functional skills in the area of BADL, iADL and Reduce care partner burden.  Equipment: No equipment provided. Pt already has drop arm BSC  Reasons for discharge: treatment goals met and discharge from hospital  Patient/family agrees with progress made and goals achieved: Yes  OT Discharge Precautions/Restrictions  Precautions Precautions: Back;Fall Restrictions Weight Bearing Restrictions: No Vision/Perception  Vision- History Baseline Vision/History: Wears glasses Wears Glasses: Reading only Patient Visual Report: No change from baseline Vision- Assessment Vision Assessment?: No apparent visual deficits  Cognition Overall Cognitive Status: Within Functional Limits for tasks assessed Arousal/Alertness: Awake/alert Orientation Level: Oriented X4 Memory: Appears intact Awareness: Appears intact Problem Solving:  Appears intact Safety/Judgment: Appears intact Sensation Sensation Light Touch: Impaired Detail Light Touch Impaired Details: Impaired RLE;Impaired LLE Proprioception: Impaired by gross assessment Additional Comments: Pt reports numbness in B LEs from knees down Coordination Gross Motor Movements are Fluid and Coordinated: No Fine Motor Movements are Fluid and Coordinated: No Coordination and Movement Description: incomplete paraparesis; extensor tone in BLE (R > L) Motor  Motor Motor: Paraplegia;Abnormal postural alignment and control;Abnormal tone Motor - Discharge Observations: Improved tone in B LE R>L  Trunk/Postural Assessment  Cervical Assessment Cervical Assessment: Exceptions to [facility] (Flexed neck) Thoracic Assessment Thoracic Assessment: Exceptions to [facility] (Kyphotic; spinal precautions) Lumbar Assessment Lumbar Assessment: Exceptions to [facility] (Posterior pelivic tilt; spinal precautions) Postural Control Postural Control: Deficits on evaluation Trunk Control: Improved balance and core stability from admission  Balance Balance Balance Assessed: Yes Static Sitting Balance Static Sitting - Balance Support: Feet unsupported;No upper extremity supported Static Sitting - Level of Assistance: 6: Modified independent (Device/Increase time) Dynamic Sitting Balance Dynamic Sitting - Balance Support: During functional activity;Feet supported Dynamic Sitting - Level of Assistance: 6: Modified independent (Device/Increase time);5: Stand by assistance Sitting balance - Comments: Sitting EOB to complete bathing/dressing tasks Extremity/Trunk Assessment RUE Assessment RUE Assessment: Within Functional Limits LUE Assessment LUE Assessment: Within Functional Limits   See Function Navigator for Current Functional Status.  Lewis, Kotaro Buer C 07/31/2015, 3:33 PM

## 2015-07-31 NOTE — Patient Care Conference (Signed)
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 E. coli UTI 08/01/2015  . Constipation due to neurogenic bowel 08/01/2015  . Muscle spasticity   . 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 Neurogenic bladder   . History of pulmonary embolism   . Post-operative pain   . Metastatic cancer ([place])   . Steroid-induced hyperglycemia   . Benign essential HTN   . Acute blood loss anemia   . Leukocytosis   . Depression   . Obstipation   . Thoracic spine tumor 07/06/2015  . 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 Cord compression ([place])   . Encounter for intubation   . Lower extremity weakness   . Respiratory failure ([place])   . Postoperative hypothyroidism   . Peripheral edema 06/07/2015  . Hypothyroidism, postop 03/08/2015  . 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 Dysuria 09/04/2014  . Hypertension 05/01/2014  . Ingrown right big toenail 05/01/2014  . Hypokalemia 05/01/2014  . GERD (gastroesophageal reflux disease) 02/14/2014  . Dysphagia, pharyngoesophageal phase 02/06/2014  . Type 2 diabetes mellitus without complication ([place]) 62/70/3500  . Constipation   . Neurogenic bowel   . 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 ([place]) 01/20/2014  . Metastatic cancer to spine ([place]) 01/19/2014  . Rotator cuff tendonitis   . Acute  postoperative respiratory failure ([place]) 01/09/2014  . 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 Leg weakness, bilateral 12/13/2013    Expected Discharge Date: Expected Discharge Date: 08/01/15  Team Members Present: Physician leading conference: Dr. Alger Simons Social Worker Present: Lennart Pall, LCSW Nurse Present: Rayetta Pigg, RN PT Present: Canary Brim, PT OT Present: Napoleon Form, OT PPS Coordinator present : Daiva Nakayama, RN, CRRN     Current Status/Progress Goal Weekly Team Focus  Medical   pain improved as is spasticity. bowels and bladder functioning.  neurological progress, spasticity control  spasticity, wound care, organizing after care   Bowel/Bladder   bowel program with dulcolax supp , LBM 5/8, continent x2 using bedpan. incontinent of urine @ times  able to void without cath  continue to monitor, PVR   Swallow/Nutrition/ Hydration             ADL's   Set-up/ Supervision bathing/dressing from EOB; min A toileting; min sliding board transfers  Min A overall  d/c planning, family training   Mobility   min assist transfers; min to mod assist bed mobility; mod I w/c mobility; lift equipment for standing  min assist overall w/c level  strengthening, neuro re-ed, family education, d/c planning, endurance   Communication             Safety/Cognition/ Behavioral Observations            Pain   occassional back  pain, percocet PRN   < 3 on 0-10 pain scale  assess pain & treat prn   Skin   surgical incision to back, hemorrhoids  Remain free of new breakdown and infection  monitor q shift    Rehab Goals Patient on target to meet rehab goals: Yes *See Care Plan and progress notes for long and short-term goals.  Barriers to Discharge: spastic paraparesis, pain    Possible Resolutions to Barriers:  continued adaptive techniques, neurological recovery    Discharge Planning/Teaching Needs:  Home with mother  and sister to provide 24/7 assistance as they were PTA  Sister has completed family ed.   Team Discussion:  Ready for d/c tomorrow. FAmily ed completed.  Sutures out tomorrow prior to d/c.  Revisions to Treatment Plan:  None   Continued Need for Acute Rehabilitation Level of Care: The patient requires daily medical management by a physician with specialized training in physical medicine and rehabilitation for the following conditions: Daily direction of a multidisciplinary physical rehabilitation program to ensure safe treatment while eliciting the highest outcome that is of practical value to the patient.: Yes Daily medical management of patient stability for increased activity during participation in an intensive rehabilitation regime.: Yes Daily analysis of laboratory values and/or radiology reports with any subsequent need for medication adjustment of medical intervention for : Neurological problems;Post surgical problems;Urological problems  Florita Nitsch 08/01/2015, 1:06 PM

## 2015-07-31 NOTE — Progress Notes (Signed)
[place] PHYSICAL MEDICINE & REHABILITATION     PROGRESS NOTE    Subjective/Complaints: No issues today except for a little nausea. Thinks it might be from [place] garden yesterday.   ROS: Denies CP, SOB, nausea, vomiting, diarrhea.   Objective: Vital Signs: Blood pressure 127/68, pulse 63, temperature 97.9 F (36.6 C), temperature source Oral, resp. rate 17, height '5\' 11"'$  (1.803 m), weight 112.492 kg (248 lb), last menstrual period 06/06/2015, SpO2 99 %. No results found. No results for input(s): WBC, HGB, HCT, PLT in the last 72 hours. No results for input(s): NA, K, CL, GLUCOSE, BUN, CREATININE, CALCIUM in the last 72 hours.  Invalid input(s): CO CBG (last 3)   Recent Labs  07/30/15 0555 07/30/15 1705 07/31/15 0611  GLUCAP 123* 185* 107*    Wt Readings from Last 3 Encounters:  07/19/15 112.492 kg (248 lb)  07/06/15 117.3 kg (258 lb 9.6 oz)  04/20/14 127.007 kg (280 lb)    Physical Exam:  Constitutional: She appears well-developed. NAD. Obese   HENT: Normocephalic and atraumatic.  Eyes: Conjunctivae and EOM are normal.  Cardiovascular: Normal rate and regular rhythm.No murmur heard. Respiratory: Effort normal. No stridor. She has no wheezes.  GI: Soft. Bowel sounds are normal. She exhibits no distension. There is no tenderness.  Musculoskeletal: She exhibits no edema or tenderness.  Neurological: She is alert and oriented.  Flat but speech clear and able to follow commands without difficulty.  BUE 5/5 proximal to distal.  RLE: hip flexion, knee extension 3-/5, ankle dorsi/plantar flexion 4/5 LLE: 2/5 proximal to distal  Extensor tone in lower extremities Skin: Skin is warm and dry.  Upper thoracic incision clean/sutures. No drainage today Psychiatric: Her speech is normal and behavior is normal. Judgment and thought content normal. Cognition and memory are normal.   Assessment/Plan: 1. 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 Physiatrist is providing close team supervision and 24 hour management of active medical problems listed below. Physiatrist and rehab team continue to assess barriers to discharge/monitor patient progress toward functional and medical goals.  Function:  Bathing Bathing position Bathing activity did not occur: Refused Position: Sitting EOB  Bathing parts Body parts bathed by patient: Right arm, Left arm, Chest, Abdomen, Right upper leg, Left upper leg, Right lower leg, Left lower leg Body parts bathed by helper: Back  Bathing assist Assist Level: Set up, Supervision or verbal cues   Set up : To obtain items  Upper Body Dressing/Undressing Upper body dressing   What is the patient wearing?: Pull over shirt/dress, Bra Bra - Perfomed by patient: Thread/unthread right bra strap, Thread/unthread left bra strap, Hook/unhook bra (pull down sports bra) Bra - Perfomed by helper: Hook/unhook bra (pull down sports bra) Pull over shirt/dress - Perfomed by patient: Thread/unthread right sleeve, Thread/unthread left sleeve, Put head through opening, Pull shirt over trunk Pull over shirt/dress - Perfomed by helper: Pull shirt over trunk        Upper body assist Assist Level: Set up, Supervision or verbal cues   Set up : To obtain clothing/put away  Lower Body Dressing/Undressing Lower body dressing Lower body dressing/undressing activity did not occur: Refused What is the patient wearing?: Pants, Shoes     Pants- Performed by patient: Thread/unthread right pants leg, Thread/unthread left pants leg, Pull pants up/down Pants- Performed by helper: Thread/unthread right pants leg, Thread/unthread left pants leg, Pull pants up/down  Non-skid slipper socks- Performed by helper: Don/doff right sock, Don/doff left sock     Shoes - Performed by patient: Don/doff right shoe, Don/doff left shoe Shoes  - Performed by helper: Don/doff right shoe, Don/doff left shoe, Fasten right, Fasten left          Lower body assist Assist for lower body dressing: Touching or steadying assistance (Pt > 75%)      Toileting Toileting Toileting activity did not occur: No continent bowel/bladder event Toileting steps completed by patient: Performs perineal hygiene Toileting steps completed by helper: Adjust clothing prior to toileting, Adjust clothing after toileting Toileting Assistive Devices: Grab bar or rail  Toileting assist Assist level: Touching or steadying assistance (Pt.75%)   Transfers Chair/bed transfer Chair/bed transfer activity did not occur: Safety/medical concerns (Pt unable to successfuly complete attempt) Chair/bed transfer method: Lateral scoot Chair/bed transfer assist level: Moderate assist (Pt 50 - 74%/lift or lower) Chair/bed transfer assistive device: Sliding board, Armrests     Locomotion Ambulation Ambulation activity did not occur: N/A (WC level)         Wheelchair Wheelchair activity did not occur: Safety/medical concerns Type: Motorized Max wheelchair distance: >1000' Assist Level: No help, No cues, assistive device, takes more than reasonable amount of time  Cognition Comprehension Comprehension assist level: Follows complex conversation/direction with no assist  Expression Expression assist level: Expresses complex ideas: With no assist  Social Interaction Social Interaction assist level: Interacts appropriately with others - No medications needed.  Problem Solving Problem solving assist level: Solves complex problems: Recognizes & self-corrects  Memory Memory assist level: Complete Independence: No helper    Medical Problem List and Plan: 1. 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 H/o PE/Anticoagulation:  Pharmaceutical: Xarelto,  3. Pain Management: Controlled currently on oxycodone prn.  -treat as needed 4. Mood: LCSW to follow for evaluation and support. Ego support provided. She is aware of hard work ahead of her but has good social supports at home.  5. Neuropsych: This patient iscapable of making decisions on her own behalf. 6. Skin/Wound Care: continue dry dressing to back incision.  7. Fluids/Electrolytes/Nutrition: Monitor I/O. Appetite has been good so far.   8. Metastatic cancer with T3 pathologic FX with cord compression and new osseous mets T12- L3/S1:   -has appt at [facility] on 5/9---working on alternative plan 9. Steroid induced Hyperglycemia: Blood sugars improving with decadron taper. Will monitor BID for now.  CBG (last 3)   Recent Labs  07/30/15 0555 07/30/15 1705 07/31/15 0611  GLUCAP 123* 185* 107*    10. HTN: Continue to monitor--off prinivil at this time as blood pressures controlled. 11. Spasticity: Continue baclofen 20 mg 5 X day with valium prn. Well controlled at present  -  zanaflex now at '2mg'$  tid and '4mg'$  qhs --this has been effective 12. ABLA: hgb up to 12.7  13. Leucocytosis: Likely steroid induced.  afebrile 14. Hypothyroid s/p thyroidectomy for hurtle cell thyroid cancer:  2. H/o depression: Resume home dose Zoloft. 15. Neurogenic bowel : changed to pm bowel program per pt preference  -colace in am    16. Neurogenic bladder:  Voiding much improved  -e coli UTI. macrobid '100mg'$  bid  -  urecholine '25mg'$  TID effective  -needs to be up on BSC or toilet to empty  LOS (Days) 20 A FACE TO FACE EVALUATION WAS PERFORMED  SWARTZ,ZACHARY T  07/31/2015 8:12 AM

## 2015-07-31 NOTE — Progress Notes (Signed)
Occupational Therapy Session Note  Patient Details  Name: Sarah [name] MRN: [id number] Date of Birth: 1965/12/20  Today's Date: 07/31/2015 OT Individual Time: 1100-1210 OT Individual Time Calculation (min): 70 min    Short Term Goals: Week 1:  OT Short Term Goal 1 (Week 1): Pt will complete sliding board transfer to [facility] with max A OT Short Term Goal 1 - Progress (Week 1): Partly met OT Short Term Goal 2 (Week 1): Pt will dress UB with set-up.  OT Short Term Goal 2 - Progress (Week 1): Met OT Short Term Goal 3 (Week 1): Pt will don pants witth mod A in supine OT Short Term Goal 3 - Progress (Week 1): Partly met Week 2:  OT Short Term Goal 1 (Week 2): Pt will transfer to [facility] with assist of one person using sliding board in order to decrease caregiver burden OT Short Term Goal 1 - Progress (Week 2): Met OT Short Term Goal 2 (Week 2): Pt will don pants sitting EOB using lateral leans to pull pants up with supervision and AE PRN OT Short Term Goal 2 - Progress (Week 2): Met OT Short Term Goal 3 (Week 2): Pt will complete bathing/dressing session with only one rest break. OT Short Term Goal 3 - Progress (Week 2): Met Week 3:  OT Short Term Goal 1 (Week 3): STG=LTG due to LOS      Skilled Therapeutic Interventions/Progress Updates:    Pt seen for skilled OT to facilitate functional transfers, toileting techniques and strategies, and LE strengthening. Pt received in w/c, discussed practicing using of female urinal for when she is away from home.  Pt declined actually trying it with clothing removal but did work on placement of it. She had to move hips forward in chair slightly to position it closely to her. Suggested placement of washcloth underneath to avoid dripping.   Pt stated she was eager to improve leg strength. Pt used power chair to go to gym and completed slide board transfer to mat.  Moved into supine to work on 4 different exercises that she can do at home from her bed: heel slides,  knee abd/add, bridges, and knee extension. Pt will need her sister to support leg to complete exercises. Pt stated she need to toilet, returned to room to complete chair><BSC transfer. Pt needed slight A with clothing management due to tight leggings.  Pt in room with all needs met.  Therapy Documentation Precautions:  Precautions Precautions: Back, Fall Precaution Booklet Issued: No Precaution Comments: Reviewed and performed log roll technique for supine<>sit Restrictions Weight Bearing Restrictions: No     Pain:no c/o pain   ADL:  See Function Navigator for Current Functional Status.   Therapy/Group: Individual Therapy  [place] 07/31/2015, 12:54 PM

## 2015-07-31 NOTE — Progress Notes (Signed)
Physical Therapy Discharge Summary  Patient Details  Name: Sarah [name] MRN: [id number] Date of Birth: 1965/04/16  Today's Date: 07/31/2015 PT Individual Time: 1430-1530 PT Individual Time Calculation (min): 60 min  Denies pain. Pt's sister present for session and reviewed education on transfers and follow up therapies. Discussed car transfer with manual w/c and recommendation to practice with HHPT and real car once her manual w/c is delivered. Sister able to return demonstrate safe min assist transfers with slideboard. Neuro re-ed using Clarise Cruz Plus for standing tolerance, postural control, weightshifting, and weightbearing/motor activation through BLE. Pt able to tolerate 1 min, 1 min 20 sec, and then the last two trials addressed weightshifting and attempting to step in place/heel raises. End of session assisted pt to bedside commode for toileting needs with min assist for transfer and supervision for lateral leans to pull down pants. Required assist from PT to remove brief. Left on [facility] with sister in room and NT made aware.      Patient has met 6 of 6 long term goals due to improved activity tolerance, improved balance, improved postural control, increased strength, increased range of motion, decreased pain and ability to compensate for deficits.  Patient to discharge at a wheelchair level [place] using slideboard for transfers. Patient's sister is independent to provide the necessary physical assistance at discharge.  Reasons goals not met: n/a - all goals met at this time.  Recommendation: Patient will benefit from ongoing skilled PT services in home health setting to continue to advance safe functional mobility, address ongoing impairments in paraparesis, tone, balance, strength, trunk control and minimize fall risk.  Equipment: No equipment provided. Pt already has power w/c from [employer], slideboard, and hospital bed. Leg loops were issued to patient during stay.  Reasons for  discharge: treatment goals met and discharge from hospital  Patient/family agrees with progress made and goals achieved: Yes  PT Discharge Precautions/Restrictions Precautions Precautions: Back;Fall Restrictions Weight Bearing Restrictions: No     Cognition Overall Cognitive Status: Within Functional Limits for tasks assessed Safety/Judgment: Appears intact Sensation Sensation Light Touch: Impaired Detail Light Touch Impaired Details: Impaired RLE;Impaired LLE Proprioception: Impaired by gross assessment Coordination Gross Motor Movements are Fluid and Coordinated: No Coordination and Movement Description: incomplete paraparesis; extensor tone in BLE (R > L) Motor  Motor Motor: Paraplegia;Abnormal postural alignment and control;Abnormal tone Motor - Discharge Observations: tone has improved since admission though still present in BLE (R > L).      Trunk/Postural Assessment  Cervical Assessment Cervical Assessment: Exceptions to [facility] (forward head) Thoracic Assessment Thoracic Assessment: Exceptions to [facility] (kyphotic; back precautions) Lumbar Assessment Lumbar Assessment: Exceptions to [facility] (back precautions; posterior pelvic tilt) Postural Control Postural Control: Deficits on evaluation Trunk Control: difficulty still with full upright posture in sitting though trunk control and balance has improved  Balance Balance Balance Assessed: Yes Static Sitting Balance Static Sitting - Level of Assistance: 6: Modified independent (Device/Increase time) Dynamic Sitting Balance Dynamic Sitting - Level of Assistance: 6: Modified independent (Device/Increase time);5: Stand by assistance Dynamic Sitting Balance - Compensations: mod I from power w/c; supervision without back support and use of UE for support Extremity Assessment   see OT d/c summary for UE details   RLE Assessment RLE Assessment: Exceptions to [facility] (2/5 at hip; 3-/5 knee extension; 2/5 ankle) LLE Assessment LLE  Assessment: Exceptions to [facility] (trace at hip; 2-/5 knee and ankle)   See Function Navigator for Current Functional Status.  [name],  PT, DPT  07/31/2015, 3:38 PM

## 2015-07-31 NOTE — Progress Notes (Signed)
Physical Therapy Session Note  Patient Details  Name: Sarah [name] MRN: [id number] Date of Birth: 1965/07/28  Today's Date: 07/31/2015 PT Individual Time: 0905-1000 PT Individual Time Calculation (min): 55 min   Short Term Goals: Week 3:  PT Short Term Goal 1 (Week 3): = LTGs with D/c tentative 5/10 at min assist w/c level  Skilled Therapeutic Interventions/Progress Updates:   Session focused on grad day activities and preparing for planned d/c tomorrow. Session started a few min late due to pt finishing up on [facility] with nurse tech. Simulated car transfer at min to mod assist level using slideboard. Pt performed transfer with min assist but requires mod assist due to BLE management in and out of car. Performed w/c mobility up/down ramp with demonstration for tilting seat back for safety when going downhill - pt return demonstrated independently. Bed mobility and transfers in ADL apartment on regular bed. Pt required supervision for set-up to min assist for transfers due to assist with LE management with pt managing parts of slideboard placement. Mod I for supine to sit but mod assist for sit to supine due to assist needed with BLE due to back precautions and weight of legs. Handout given and reviewed with pt for HEP for BLE strengthening and stretching - pt verbalized understanding. Dc planning discussed and pt reports her sister planning to come this afternoon.  Community mobility off unit in gift shop for pt to purchase items at overall mod I level and asking appropriately for when unable to get to items. End of session left up in w/c with all needs in reach.   Therapy Documentation Precautions:  Precautions Precautions: Back, Fall Precaution Booklet Issued: No Precaution Comments: Reviewed and performed log roll technique for supine<>sit Restrictions Weight Bearing Restrictions: No  Pain:  Reports pain is managed. Upset stomach.   See Function Navigator for Current Functional  Status.   Therapy/Group: Individual Therapy  [name], PT, DPT  07/31/2015, 10:08 AM

## 2015-08-01 DIAGNOSIS — N390 Urinary tract infection, site not specified: Secondary | ICD-10-CM

## 2015-08-01 DIAGNOSIS — K592 Neurogenic bowel, not elsewhere classified: Secondary | ICD-10-CM | POA: Diagnosis present

## 2015-08-01 DIAGNOSIS — B9620 Unspecified Escherichia coli [E. coli] as the cause of diseases classified elsewhere: Secondary | ICD-10-CM | POA: Diagnosis not present

## 2015-08-01 DIAGNOSIS — K5900 Constipation, unspecified: Secondary | ICD-10-CM | POA: Diagnosis present

## 2015-08-01 LAB — GLUCOSE, CAPILLARY: GLUCOSE-CAPILLARY: 117 mg/dL — AB (ref 65–99)

## 2015-08-01 MED ORDER — BISACODYL 10 MG RE SUPP: RECTAL | Status: DC

## 2015-08-01 MED ORDER — ONDANSETRON HCL 4 MG PO TABS: 4.0000 mg | ORAL_TABLET | Freq: Four times a day (QID) | ORAL | Status: DC

## 2015-08-01 MED ORDER — BACLOFEN 20 MG PO TABS: 20.0000 mg | ORAL_TABLET | Freq: Every day | ORAL | Status: DC

## 2015-08-01 MED ORDER — TIZANIDINE HCL 2 MG PO TABS: 2.0000 mg | ORAL_TABLET | Freq: Three times a day (TID) | ORAL | Status: DC

## 2015-08-01 MED ORDER — DEXAMETHASONE 2 MG PO TABS: 2.0000 mg | ORAL_TABLET | Freq: Two times a day (BID) | ORAL | Status: DC

## 2015-08-01 MED ORDER — SENNA 8.6 MG PO TABS: 2.0000 | ORAL_TABLET | Freq: Every day | ORAL | Status: DC

## 2015-08-01 MED ORDER — BETHANECHOL CHLORIDE 25 MG PO TABS: ORAL_TABLET | ORAL | Status: DC

## 2015-08-01 MED ORDER — OXYCODONE-ACETAMINOPHEN 5-325 MG PO TABS: 1.0000 | ORAL_TABLET | Freq: Three times a day (TID) | ORAL | Status: DC | PRN

## 2015-08-01 NOTE — Discharge Summary (Signed)
Physician Discharge Summary  Patient ID: Sarah [name] MRN: [id number] DOB/AGE: Feb 22, 1966 50 y.o.  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 coli UTI   Constipation due to neurogenic bowel   Discharged Condition: stable.    Labs:  Basic Metabolic Panel: BMP Latest Ref Rng 07/12/2015 07/07/2015 07/06/2015  Glucose 65 - 99 mg/dL 121(H) 156(H) 182(H)  BUN 6 - 20 mg/dL '17 17 13  '$ Creatinine 0.44 - 1.00 mg/dL 0.91 1.08(H) 1.05(H)  Sodium 135 - 145 mmol/L 137 139 136  Potassium 3.5 - 5.1 mmol/L 4.6 4.3 4.1  Chloride 101 - 111 mmol/L 100(L) 104 103  CO2 22 - 32 mmol/L '26 23 22  '$ Calcium 8.9 - 10.3 mg/dL 7.9(L) 8.2(L) 8.7(L)     CBC: CBC Latest Ref Rng 07/12/2015 07/07/2015 07/06/2015  WBC 4.0 - 10.5 K/uL 9.8 10.8(H) 5.7  Hemoglobin 12.0 - 15.0 g/dL 12.7 10.9(L) 11.8(L)  Hematocrit 36.0 - 46.0 % 38.6 31.0(L) 33.7(L)  Platelets 150 - 400 K/uL 234 207 167     CBG:  Recent Labs Lab 07/30/15 0555 07/30/15 1705 07/31/15 0611 07/31/15 1651 08/01/15 0610  GLUCAP 123* 185* 107* 169* 117*     Brief HPI:   Sarah [name] is a 50 y.o. 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 T3 tumor resected 01/09/14 and subsequent chemo/XRT.  She has had 3 week history of increase in spasticity, increase in BLE edema. She was admitted on 07/05/15 with B/B incontinence and decline in motor function. MRI done showing chronic T3 pathologic fracture with increased bulky epidural tumor with severe cord compression, new abnormal marrow  signal T12- L3 with new osseous metastatic disease and similar findings S1. She was taken to OR emergently for T3 laminectomy for subtotal tumor resection by Dr. Christella Noa and treated with multiple units PRBC for ABLA.  Therapy ongoing and patient has had increase in spasticity, worsening of BLE weakenss with decline in functional mobility as well as ability to complete ADL tasks. CIR was recommended for follow up therapy.     Hospital Course: Sarah [name] was admitted to rehab 07/11/2015 for inpatient therapies to consist of PT and OT at least three hours five days a week. Past admission physiatrist, therapy team and rehab RN have worked together to provide customized collaborative inpatient rehab.  Blood pressures have been reasonably controlled without orthostatic symptoms. Follow up labs showed electrolytes to be stable and CBC shows ABLA is resolving. Foley was kept in place for a few days per patient preference and until bowel program was established. She was found to have E coli UTI at admission and was treated with macrobid X 7 days. Foley was removed on 04/25 and bladder training was initiated. She required in and out caths to help decompres bladder and repeat UCS of 5/1 showed recurrent UTI. She was treated with macrobid X week and Urecholine was added to help with bladder function.  This which has slowly improved with timed voids and with patient getting to BSC/toilet to help with emptying.   She was limited  by spasticity therefore baclofen was titrated up to 5 X day. Z anafex was added and titrated ot 2 mg tid and 4 mg HS with good results. Zoloft was resumed past admission and mood has been stable. She has shown good motivation and has made steady progress during her stay.  She was maintained on decadron bid and BS were monitored on bid basis with reasonable control. She was educated on monitoring carbohydrate intake and is to follow up with Dr. Ermalene Postin for input on further taper. She has made  steady progress during her rehab stay and is at min assist at discharge. She will continue to receive follow up [place], [place] [facility] [place] by [facility] after discharge.    Rehab course: During patient's stay in rehab weekly team conferences were held to monitor patient's progress, set goals and discuss barriers to discharge. At admission, patient required total assist with basic self care tasks and mobility.   She has had improvement in activity tolerance, balance, postural control, as well as ability to compensate for deficits. She is has had improvement in ability to weight bear thorough BLE. She is able to bathe and dress upper body  with supervision and set up assist. She requires min assist for lower body bathing and dressing. She is She is modified independent for supine to sitting and sitting balance. She required moderate assist for sit to supine due to needing assistance with BLE management. She requires min assist for SB transfers and is able to stand in [place] for 60-80 seconds with wight shifting. Family education was done with sister regarding all aspects of physical care.    Disposition: Home   Diet: Monitor sweets and starches.   Special Instructions: 1. Continue bowel program daily and adjust as needed. Remember abdominal massage.  2. Monitor ability to void and any issues with increase pressure/difficulty emptying bladder as urecholine is tapered. Discuss with Dr. Naaman Plummer on follow up.       Medication List    STOP taking these medications        ibuprofen 200 MG tablet  Commonly known as:  ADVIL,MOTRIN     KLOR-CON M10 10 MEQ tablet  Generic drug:  potassium chloride     lisinopril 10 MG tablet  Commonly known as:  PRINIVIL,ZESTRIL      TAKE these medications        baclofen 20 MG tablet  Commonly known as:  LIORESAL  Take 1 tablet (20 mg total) by mouth 5 (five) times daily.     bethanechol 25 MG tablet  Commonly known as:  URECHOLINE  Use one pill three  times a day for a week.--see taper  Notes to Patient:  Use one pill three times a day for a week.  Then decrease to one pill twice a day and monitor for any difficulty in ability to empty bladder, incontinence or other problems.      bisacodyl 10 MG suppository  Commonly known as:  DULCOLAX  Daily in evening after supper     dexamethasone 2 MG tablet  Commonly known as:  DECADRON  Take 1 tablet (2 mg total) by mouth every 12 (twelve) hours.  Notes to Patient:  Talk to Dr. Leamon Arnt about tapering decadron.      levothyroxine 200 MCG tablet  Commonly known as:  SYNTHROID, LEVOTHROID  Take 200 mcg by mouth daily before breakfast.     ondansetron 4 MG tablet  Commonly known as:  ZOFRAN  Take 1 tablet (  4 mg total) by mouth every 6 (six) hours.     oxyCODONE-acetaminophen 5-325 MG tablet--Rx # 60 pills   Commonly known as:  PERCOCET/ROXICET  Take 1-2 tablets by mouth every 8 (eight) hours as needed for moderate pain.     pantoprazole 40 MG tablet  Commonly known as:  PROTONIX  TAKE ONE TABLET BY MOUTH ONCE DAILY     rivaroxaban 20 MG Tabs tablet  Commonly known as:  XARELTO  Take 1 tablet (20 mg total) by mouth daily.     senna 8.6 MG Tabs tablet  Commonly known as:  SENOKOT  Take 2 tablets (17.2 mg total) by mouth daily.     sertraline 25 MG tablet  Commonly known as:  ZOLOFT  Take 1 tablet (25 mg total) by mouth daily.     tiZANidine 2 MG tablet  Commonly known as:  ZANAFLEX  Use one pill at 7am, noon and at 6 pm. Use two pills at bedtime     zinc oxide 11.3 % Crea cream  Commonly known as:  BALMEX  Apply 1 application topically daily as needed. Apply to rectum       Follow-up Information    Follow up with [name], MD.   Specialty:  Physical Medicine and Rehabilitation   Why:  office will call you with follow up appointment   Contact information:   [address], [address] [place] [address] [phone]       Follow up with CABBELL,[name] L, MD. Call  today.   Specialty:  Neurosurgery   Why:  for follow up appointment   Contact information:   [address] [place] [address] [place] [address] [phone]       Follow up with Leslie Andrea, MD On 08/07/2015.   Specialty:  Internal Medicine   Why:  appointment for blood work/MD visit to discuss treatment options   Contact information:   [facility] [place]  [address] [phone]       Follow up with Georges Lynch, MD On 08/06/2015.   Specialty:  Family Medicine   Why:  @ 11:00 am (will be with Dr. Ardelia Mems covering for [facility]) - hospital follow up appointment   Contact information:   [place] [place] [address] [phone]       Signed: Bary Leriche 08/02/2015, 10:24 AM

## 2015-08-01 NOTE — Discharge Instructions (Signed)
Inpatient Rehab Discharge Instructions  Sarah [name] Discharge date and time: 08/01/15   Activities/Precautions/ Functional Status: Activity: activity as tolerated Diet: low fat, low cholesterol diet--watch sweets and starches.  Wound Care: keep wound clean and dry   Functional status:  ___ No restrictions     ___ Walk up steps independently _X__ 24/7 supervision/assistance   ___ Walk up steps with assistance ___ Intermittent supervision/assistance  ___ Bathe/dress independently ___ Walk with walker     _X__ Bathe/dress with assistance ___ Walk Independently    ___ Shower independently ___ Walk with assistance    ___ Shower with assistance _X__ No alcohol     ___ Return to work/school ________    COMMUNITY REFERRALS UPON DISCHARGE:    Home Health:   PT     OT     RN                     Agency:  [facility] Phone: [phone]       Special Instructions: 1. Continue bowel program daily and adjust as needed. Remember abdominal massage.  2. Monitor ability to void and any issues with increase pressure/difficulty emptying bladder as urecholine is tapered. Discuss with Dr. Naaman Plummer on follow up.     My questions have been answered and I understand these instructions. I will adhere to these goals and the provided educational materials after my discharge from the hospital.  Patient/Caregiver Signature _______________________________ Date __________  Clinician Signature _______________________________________ Date __________  Please bring this form and your medication list with you to all your follow-up doctor's appointments.

## 2015-08-01 NOTE — Progress Notes (Signed)
[facility] PHYSICAL MEDICINE & REHABILITATION     PROGRESS NOTE    Subjective/Complaints: Feeling well and prepared for home. No new complaints  ROS: Denies CP, SOB, nausea, vomiting, diarrhea.   Objective: Vital Signs: Blood pressure 122/70, pulse 57, temperature 97.8 F (36.6 C), temperature source Oral, resp. rate 16, height _0  (1.803 m), weight 112.492 kg (248 lb), last menstrual period 06/06/2015, SpO2 100 %. No results found. No results for input(s): WBC, HGB, HCT, PLT in the last 72 hours. No results for input(s): NA, K, CL, GLUCOSE, BUN, CREATININE, CALCIUM in the last 72 hours.  Invalid input(s): CO CBG (last 3)   Recent Labs  07/31/15 0611 07/31/15 1651 08/01/15 0610  GLUCAP 107* 169* 117*    Wt Readings from Last 3 Encounters:  07/19/15 112.492 kg (248 lb)  07/06/15 117.3 kg (258 lb 9.6 oz)  04/20/14 127.007 kg (280 lb)    Physical Exam:  Constitutional: She appears well-developed. NAD. Obese   HENT: Normocephalic and atraumatic.  Eyes: Conjunctivae and EOM are normal.  Cardiovascular: Normal rate and regular rhythm.No murmur heard. Respiratory: Effort normal. No stridor. She has no wheezes.  GI: Soft. Bowel sounds are normal. She exhibits no distension. There is no tenderness.  Musculoskeletal: She exhibits no edema or tenderness.  Neurological: She is alert and oriented.  Flat but speech clear and able to follow commands without difficulty.  BUE 5/5 proximal to distal.  RLE: hip flexion, knee extension 3-/5, ankle dorsi/plantar flexion 4/5 LLE: 2/5 proximal to distal  Extensor tone in lower extremities Skin: Skin is warm and dry.  Upper thoracic incision clean/sutures. No drainage today again Psychiatric: Her speech is normal and behavior is normal. Judgment and thought content normal. Cognition and memory are normal.   Assessment/Plan: 1. 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 Physiatrist is providing close team supervision and 24 hour management of active medical problems listed below. Physiatrist and rehab team continue to assess barriers to discharge/monitor patient progress toward functional and medical goals.  Function:  Bathing Bathing position Bathing activity did not occur: Refused Position: Sitting EOB  Bathing parts Body parts bathed by patient: Right arm, Left arm, Chest, Abdomen, Right upper leg, Left upper leg, Right lower leg, Left lower leg, Front perineal area, Buttocks Body parts bathed by helper: Back  Bathing assist Assist Level: Supervision or verbal cues, Set up   Set up : To obtain items  Upper Body Dressing/Undressing Upper body dressing   What is the patient wearing?: Pull over shirt/dress, Bra Bra - Perfomed by patient: Thread/unthread right bra strap, Thread/unthread left bra strap, Hook/unhook bra (pull down sports bra) Bra - Perfomed by helper: Hook/unhook bra (pull down sports bra) Pull over shirt/dress - Perfomed by patient: Thread/unthread right sleeve, Thread/unthread left sleeve, Put head through opening, Pull shirt over trunk Pull over shirt/dress - Perfomed by helper: Pull shirt over trunk        Upper body assist Assist Level: Set up   Set up : To obtain clothing/put away  Lower Body Dressing/Undressing Lower body dressing Lower body dressing/undressing activity did not occur: Refused What is the patient wearing?: Pants, Shoes, [employer]- Performed by patient: Thread/unthread right pants leg, Thread/unthread left pants leg, Pull pants up/down Pants- Performed by helper: Thread/unthread right pants leg, Thread/unthread left pants leg, Pull pants up/down   Non-skid slipper socks- Performed  by helper: Don/doff right sock, Don/doff left sock     Shoes - Performed by patient: Don/doff right shoe, Don/doff left shoe Shoes - Performed by  helper: Don/doff right shoe, Don/doff left shoe, Fasten right, Fasten left       TED Hose - Performed by helper: Don/doff right TED hose, Don/doff left TED hose  Lower body assist Assist for lower body dressing: Touching or steadying assistance (Pt > 75%)      Toileting Toileting Toileting activity did not occur: No continent bowel/bladder event Toileting steps completed by patient: Adjust clothing prior to toileting, Performs perineal hygiene, Adjust clothing after toileting Toileting steps completed by helper: Adjust clothing prior to toileting, Adjust clothing after toileting Toileting Assistive Devices: Grab bar or rail  Toileting assist Assist level: Touching or steadying assistance (Pt.75%)   Transfers Chair/bed transfer Chair/bed transfer activity did not occur: Safety/medical concerns (Pt unable to successfuly complete attempt) Chair/bed transfer method: Lateral scoot Chair/bed transfer assist level: Touching or steadying assistance (Pt > 75%) Chair/bed transfer assistive device: Sliding board, Armrests     Locomotion Ambulation Ambulation activity did not occur: Safety/medical concerns (pt was w/c level PTA)         Wheelchair Wheelchair activity did not occur: Safety/medical concerns Type: Motorized Max wheelchair distance: >1000' Assist Level: No help, No cues, assistive device, takes more than reasonable amount of time  Cognition Comprehension Comprehension assist level: Follows complex conversation/direction with no assist  Expression Expression assist level: Expresses complex ideas: With no assist  Social Interaction Social Interaction assist level: Interacts appropriately with others - No medications needed.  Problem Solving Problem solving assist level: Solves complex problems: Recognizes & self-corrects  Memory Memory assist level: Complete Independence: No helper    Medical Problem List and Plan: 1. 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 Goals met.   Patient to see me in the office for transitional care encounter in 1-2 weeks. 2. H/o PE/Anticoagulation: Pharmaceutical: Xarelto,  3. Pain Management: Controlled currently on oxycodone prn.  -treat as needed 4. Mood: LCSW to follow for evaluation and support. Ego support provided. She is aware of hard work ahead of her but has good social supports at home.  5. Neuropsych: This patient iscapable of making decisions on her own behalf. 6. Skin/Wound Care: remove sutures today 7. Fluids/Electrolytes/Nutrition: Monitor I/O. Appetite has been good so far.   8. Metastatic cancer with T3 pathologic FX with cord compression and new osseous mets T12- L3/S1:   -has app[facility] at [facility] on 5/9---working on alternative plan 9. Steroid induced Hyperglycemia: Blood sugars improving with decadron taper. Will monitor BID for now.  CBG (last 3)   Recent Labs  07/31/15 0611 07/31/15 1651 08/01/15 0610  GLUCAP 107* 169* 117*    10. HTN: Continue to monitor--off prinivil at this time as blood pressures controlled. 11. Spasticity: Continue baclofen 20 mg 5 X day with valium prn. Well controlled at present  -  zanaflex now at 65m tid and 447mqhs --this has been effective 12. ABLA: hgb up to 12.7  13. Leucocytosis: Likely steroid induced.  afebrile 14. Hypothyroid s/p thyroidectomy for hurtle cell thyroid cancer:  1518H/o depression: Resume home dose Zoloft. 15. Neurogenic bowel : changed to pm bowel program per pt preference  -colace in am    16. Neurogenic bladder:  Voiding much improved  -e coli UTI. macrobid 10042m  bid--10 day course  -  urecholine 91m TID effective---can slowly taper after discharge     LOS (Days) 21 A FACE TO FACE EVALUATION WAS PERFORMED  Captain Blucher T 08/01/2015 8:42 AM

## 2015-08-01 NOTE — Progress Notes (Signed)
Patient discharged to home with her parents.

## 2015-08-01 NOTE — Progress Notes (Signed)
Social Work  Discharge Note  The overall goal for the admission was met for:   Discharge location: Yes - home with family providing 24/7 assistance  Length of Stay: Yes - 21 days  Discharge activity level: Yes - minimal assistance w/c  Home/community participation: Yes  Services provided included: MD, RD, PT, OT, RN, TR, Pharmacy, Neuropsych and SW  Financial Services: Private Insurance: [place]  Follow-up services arranged: Home Health: Therapist, sports, PT, OT via [facility] and Patient/Family has no preference for HH/DME agencies  Comments (or additional information):  Patient/Family verbalized understanding of follow-up arrangements: Yes  Individual responsible for coordination of the follow-up plan: pt  Confirmed correct DME delivered: NA - had all needed DME    Teancum Brule

## 2015-08-02 ENCOUNTER — Telehealth: Payer: Self-pay | Admitting: Family Medicine

## 2015-08-02 ENCOUNTER — Telehealth: Payer: Self-pay

## 2015-08-02 NOTE — Telephone Encounter (Signed)
Transitional Care Questions   Questions for our staff to ask patients on Transitional care 48 hour phone call:   1. Are you/is patient experiencing any problems since coming home? NoAre there any questions regarding any aspect of care?  NO  2. Are there any questions regarding medications administration/dosing? Are meds being taken as prescribed? Patient should review meds with caller to confirm   3. Have there been any falls?   4. Has Home Health been to the house and/or have they contacted you? If not, have you tried to contact them? Can we help you contact them?   5. Are bowels and bladder emptying properly? Are there any unexpected incontinence issues? If applicable, is patient following bowel/bladder programs?   6. Any fevers, problems with breathing, unexpected pain?   7. Are there any skin problems or new areas of breakdown?   8. Has the patient/family member arranged specialty MD follow up (ie cardiology/neurology/renal/surgical/etc)? Can we help arrange?   9. Does the patient need any other services or support that we can help arrange?   10. Are caregivers following through as expected in assisting the patient?   11. Has the patient quit smoking, drinking alcohol, or using drugs as recommended?

## 2015-08-02 NOTE — Telephone Encounter (Signed)
Transitional Care Questions   Questions for our staff to ask patients on Transitional care 48 hour phone call:   1. Are you/is patient experiencing any problems since coming home? No. Are there any questions regarding any aspect of care? None.   2. Are there any questions regarding medications administration/dosing? Not at this time. Are meds being taken as prescribed? Yes. Patient should review meds with caller to confirm   3. Have there been any falls? No Falls.  4. Has Home Health been to the house and/or have they contacted you? Yes. They have contacted patient this morning. If not, have you tried to contact them? Can we help you contact them?   5. Are bowels and bladder emptying properly? Yes.   6. Any fevers, problems with breathing, unexpected pain? No Fevers, Pain or trouble breathing  7. Are there any skin problems or new areas of breakdown? No Problems.  8. Has the patient/family member arranged specialty MD follow up (ie cardiology/neurology/renal/surgical/etc)? Can we help arrange? States that follow up have been arranged.  9. Does the patient need any other services or support that we can help arrange? Not at this time.   10. Are caregivers following through as expected in assisting the patient? Yes. Sister  56. Has the patient quit smoking, drinking alcohol, or using drugs as recommended? N/A  Patient advised of appointment on Tuesday May 23rd @ 10:45am with Dr. Naaman Plummer. Patient verbalized understanding.

## 2015-08-02 NOTE — Telephone Encounter (Signed)
Solange from [facility] called and wanted the doctor to know that they are providing Nursing Case management for the patient. Nothing needs to be done by the doctor they just wanted the doctor to be aware and if there is something that they can help with please call her. jw

## 2015-08-06 ENCOUNTER — Ambulatory Visit: Payer: BLUE CROSS/BLUE SHIELD | Admitting: Family Medicine

## 2015-08-07 ENCOUNTER — Other Ambulatory Visit (HOSPITAL_COMMUNITY): Payer: Self-pay | Admitting: Neurosurgery

## 2015-08-07 DIAGNOSIS — C73 Malignant neoplasm of thyroid gland: Secondary | ICD-10-CM

## 2015-08-13 ENCOUNTER — Ambulatory Visit (HOSPITAL_COMMUNITY)
Admission: RE | Admit: 2015-08-13 | Discharge: 2015-08-13 | Disposition: A | Discharge status: Home / Self Care | Payer: BLUE CROSS/BLUE SHIELD | Source: Ambulatory Visit | Attending: Neurosurgery | Admitting: Neurosurgery

## 2015-08-13 DIAGNOSIS — G9520 Unspecified cord compression: Secondary | ICD-10-CM | POA: Insufficient documentation

## 2015-08-13 DIAGNOSIS — C73 Malignant neoplasm of thyroid gland: Secondary | ICD-10-CM | POA: Insufficient documentation

## 2015-08-13 DIAGNOSIS — C7800 Secondary malignant neoplasm of unspecified lung: Secondary | ICD-10-CM | POA: Diagnosis not present

## 2015-08-13 MED ORDER — GADOBENATE DIMEGLUMINE 529 MG/ML IV SOLN
20.0000 mL | Freq: Once | INTRAVENOUS | Status: AC | PRN
  Administered 2015-08-13: 20 mL via INTRAVENOUS

## 2015-08-14 ENCOUNTER — Encounter: Payer: BLUE CROSS/BLUE SHIELD | Admitting: Physical Medicine & Rehabilitation

## 2015-08-16 ENCOUNTER — Encounter: Payer: Self-pay | Admitting: Hematology

## 2015-08-16 ENCOUNTER — Telehealth: Payer: Self-pay | Admitting: Hematology

## 2015-08-16 NOTE — Telephone Encounter (Signed)
Telephone call to patient regarding appointment with [name], 6/6 '@2PM'$ . Patient agreed to appointment. Patient was informed to arrive 30 minutes early. Letter to referring provider.

## 2015-08-22 ENCOUNTER — Other Ambulatory Visit: Payer: Self-pay | Admitting: *Deleted

## 2015-08-22 ENCOUNTER — Telehealth: Payer: Self-pay | Admitting: *Deleted

## 2015-08-22 DIAGNOSIS — IMO0002 Reserved for concepts with insufficient information to code with codable children: Secondary | ICD-10-CM

## 2015-08-22 DIAGNOSIS — G8220 Paraplegia, unspecified: Secondary | ICD-10-CM

## 2015-08-22 MED ORDER — TIZANIDINE HCL 2 MG PO TABS: 2.0000 mg | ORAL_TABLET | Freq: Three times a day (TID) | ORAL | Status: DC

## 2015-08-22 MED ORDER — RIVAROXABAN 20 MG PO TABS: 20.0000 mg | ORAL_TABLET | Freq: Every day | ORAL | Status: DC

## 2015-08-22 NOTE — Telephone Encounter (Signed)
Pt left message asking for refills on tizanidine, xarelto and decadron.  Hospital discharge medications? Please advise

## 2015-08-22 NOTE — Telephone Encounter (Signed)
Called patient back and informed that xarelto and tizanidine refilled.  Informed pt that she should contact Dr. Leamon Arnt regarding decadron. Reminded patient of upcoming appt on 09/04/2015. Pt acknowledged

## 2015-08-22 NOTE — Telephone Encounter (Signed)
Pt should probably be tapering off the decadron, but she was told to discuss with Dr. Leamon Arnt regarding plans for taper.  Other meds filled.

## 2015-08-28 ENCOUNTER — Ambulatory Visit (HOSPITAL_BASED_OUTPATIENT_CLINIC_OR_DEPARTMENT_OTHER): Payer: BLUE CROSS/BLUE SHIELD | Admitting: Hematology

## 2015-08-28 ENCOUNTER — Telehealth: Payer: Self-pay | Admitting: Hematology

## 2015-08-28 ENCOUNTER — Ambulatory Visit (HOSPITAL_BASED_OUTPATIENT_CLINIC_OR_DEPARTMENT_OTHER): Payer: BLUE CROSS/BLUE SHIELD

## 2015-08-28 VITALS — BP 123/87 | HR 87 | Temp 98.5°F | Resp 17 | Ht 71.0 in

## 2015-08-28 DIAGNOSIS — C73 Malignant neoplasm of thyroid gland: Secondary | ICD-10-CM

## 2015-08-28 DIAGNOSIS — B370 Candidal stomatitis: Secondary | ICD-10-CM | POA: Diagnosis not present

## 2015-08-28 DIAGNOSIS — C7951 Secondary malignant neoplasm of bone: Secondary | ICD-10-CM

## 2015-08-28 DIAGNOSIS — C7800 Secondary malignant neoplasm of unspecified lung: Secondary | ICD-10-CM

## 2015-08-28 DIAGNOSIS — M7989 Other specified soft tissue disorders: Secondary | ICD-10-CM

## 2015-08-28 DIAGNOSIS — Z86711 Personal history of pulmonary embolism: Secondary | ICD-10-CM

## 2015-08-28 LAB — COMPREHENSIVE METABOLIC PANEL
ALK PHOS: 110 U/L (ref 40–150)
ALT: 47 U/L (ref 0–55)
ANION GAP: 8 meq/L (ref 3–11)
AST: 33 U/L (ref 5–34)
Albumin: 2.7 g/dL — ABNORMAL LOW (ref 3.5–5.0)
BILIRUBIN TOTAL: 1.74 mg/dL — AB (ref 0.20–1.20)
BUN: 11.7 mg/dL (ref 7.0–26.0)
CALCIUM: 8.6 mg/dL (ref 8.4–10.4)
CO2: 27 mEq/L (ref 22–29)
CREATININE: 1.1 mg/dL (ref 0.6–1.1)
Chloride: 108 mEq/L (ref 98–109)
EGFR: 57 mL/min/{1.73_m2} — ABNORMAL LOW (ref >90)
Glucose: 95 mg/dl (ref 70–140)
Potassium: 3.7 mEq/L (ref 3.5–5.1)
Sodium: 143 mEq/L (ref 136–145)
TOTAL PROTEIN: 6.1 g/dL — AB (ref 6.4–8.3)

## 2015-08-28 LAB — CBC & DIFF AND RETIC
BASO%: 0 % (ref 0.0–2.0)
Basophils Absolute: 0 10*3/uL (ref 0.0–0.1)
EOS%: 2.6 % (ref 0.0–7.0)
Eosinophils Absolute: 0.1 10*3/uL (ref 0.0–0.5)
HCT: 37 % (ref 34.8–46.6)
HGB: 12.9 g/dL (ref 11.6–15.9)
Immature Retic Fract: 7.2 % (ref 1.60–10.00)
LYMPH%: 30.3 % (ref 14.0–49.7)
MCH: 29.8 pg (ref 25.1–34.0)
MCHC: 34.9 g/dL (ref 31.5–36.0)
MCV: 85.5 fL (ref 79.5–101.0)
MONO#: 0.2 10*3/uL (ref 0.1–0.9)
MONO%: 6.9 % (ref 0.0–14.0)
NEUT#: 2.1 10*3/uL (ref 1.5–6.5)
NEUT%: 60.2 % (ref 38.4–76.8)
Platelets: 102 10*3/uL — ABNORMAL LOW (ref 145–400)
RBC: 4.33 10*6/uL (ref 3.70–5.45)
RDW: 14.9 % — ABNORMAL HIGH (ref 11.2–14.5)
Retic %: 1.08 % (ref 0.70–2.10)
Retic Ct Abs: 46.76 10*3/uL (ref 33.70–90.70)
WBC: 3.5 10*3/uL — ABNORMAL LOW (ref 3.9–10.3)
lymph#: 1.1 10*3/uL (ref 0.9–3.3)

## 2015-08-28 MED ORDER — NYSTATIN 100000 UNIT/ML MT SUSP: 5.0000 mL | Freq: Four times a day (QID) | OROMUCOSAL | Status: DC

## 2015-08-28 NOTE — Telephone Encounter (Signed)
per of to sch pt appt-gave pt copyof avs

## 2015-08-29 ENCOUNTER — Telehealth: Payer: Self-pay | Admitting: Physical Medicine & Rehabilitation

## 2015-08-29 DIAGNOSIS — G8220 Paraplegia, unspecified: Secondary | ICD-10-CM

## 2015-08-29 NOTE — Telephone Encounter (Signed)
Please address thank you

## 2015-08-29 NOTE — Telephone Encounter (Signed)
Please arrange [place] PT and OT. We ordered it at the hospital. I'm unclear why it needs to be re-ordered again????

## 2015-08-29 NOTE — Telephone Encounter (Signed)
Sarah [name] called to request Home Health PT and OT for patient.  Please call her at [phone] if this can be arranged.

## 2015-08-30 NOTE — Telephone Encounter (Signed)
Orders entered. [name] is aware.

## 2015-09-04 ENCOUNTER — Encounter
Payer: BLUE CROSS/BLUE SHIELD | Attending: Physical Medicine & Rehabilitation | Admitting: Physical Medicine & Rehabilitation

## 2015-09-04 ENCOUNTER — Encounter: Payer: Self-pay | Admitting: Physical Medicine & Rehabilitation

## 2015-09-04 VITALS — BP 113/83 | HR 102 | Resp 16

## 2015-09-04 DIAGNOSIS — C73 Malignant neoplasm of thyroid gland: Secondary | ICD-10-CM

## 2015-09-04 DIAGNOSIS — IMO0002 Reserved for concepts with insufficient information to code with codable children: Secondary | ICD-10-CM

## 2015-09-04 DIAGNOSIS — R1314 Dysphagia, pharyngoesophageal phase: Secondary | ICD-10-CM | POA: Diagnosis not present

## 2015-09-04 DIAGNOSIS — E669 Obesity, unspecified: Secondary | ICD-10-CM | POA: Diagnosis not present

## 2015-09-04 DIAGNOSIS — M5030 Other cervical disc degeneration, unspecified cervical region: Secondary | ICD-10-CM | POA: Insufficient documentation

## 2015-09-04 DIAGNOSIS — R252 Cramp and spasm: Secondary | ICD-10-CM | POA: Insufficient documentation

## 2015-09-04 DIAGNOSIS — T45515A Adverse effect of anticoagulants, initial encounter: Secondary | ICD-10-CM | POA: Diagnosis not present

## 2015-09-04 DIAGNOSIS — Z86711 Personal history of pulmonary embolism: Secondary | ICD-10-CM | POA: Insufficient documentation

## 2015-09-04 DIAGNOSIS — R609 Edema, unspecified: Secondary | ICD-10-CM | POA: Diagnosis not present

## 2015-09-04 DIAGNOSIS — N319 Neuromuscular dysfunction of bladder, unspecified: Secondary | ICD-10-CM | POA: Insufficient documentation

## 2015-09-04 DIAGNOSIS — G8220 Paraplegia, unspecified: Secondary | ICD-10-CM | POA: Diagnosis not present

## 2015-09-04 DIAGNOSIS — Z5189 Encounter for other specified aftercare: Secondary | ICD-10-CM | POA: Diagnosis not present

## 2015-09-04 DIAGNOSIS — C7951 Secondary malignant neoplasm of bone: Secondary | ICD-10-CM | POA: Diagnosis not present

## 2015-09-04 DIAGNOSIS — D7582 Heparin induced thrombocytopenia (HIT): Secondary | ICD-10-CM | POA: Insufficient documentation

## 2015-09-04 DIAGNOSIS — K592 Neurogenic bowel, not elsewhere classified: Secondary | ICD-10-CM

## 2015-09-04 DIAGNOSIS — I10 Essential (primary) hypertension: Secondary | ICD-10-CM | POA: Insufficient documentation

## 2015-09-04 DIAGNOSIS — G8929 Other chronic pain: Secondary | ICD-10-CM | POA: Insufficient documentation

## 2015-09-04 DIAGNOSIS — C7801 Secondary malignant neoplasm of right lung: Secondary | ICD-10-CM | POA: Diagnosis not present

## 2015-09-04 DIAGNOSIS — M8448XA Pathological fracture, other site, initial encounter for fracture: Secondary | ICD-10-CM | POA: Insufficient documentation

## 2015-09-04 DIAGNOSIS — C7802 Secondary malignant neoplasm of left lung: Secondary | ICD-10-CM | POA: Diagnosis not present

## 2015-09-04 DIAGNOSIS — G839 Paralytic syndrome, unspecified: Secondary | ICD-10-CM | POA: Diagnosis not present

## 2015-09-04 DIAGNOSIS — M8458XD Pathological fracture in neoplastic disease, other specified site, subsequent encounter for fracture with routine healing: Secondary | ICD-10-CM | POA: Diagnosis not present

## 2015-09-04 MED ORDER — BACLOFEN 20 MG PO TABS: 20.0000 mg | ORAL_TABLET | Freq: Three times a day (TID) | ORAL | Status: DC

## 2015-09-04 NOTE — Progress Notes (Signed)
Subjective:    Patient ID: Sarah [name], female    DOB: 01/12/1966, 50 y.o.   MRN: [id number]  HPI   Sarah [name] is here in follow up of her spinal cord injury. She has been home for about a week. She has seen her oncologist who started her on lenvima which has cause some thrush. Her left leg is still lazy. Her pain has been reasonable. The legs are still tingling. She has had some swelling which some compression sleeves have helped. She was given an rx for gabapentin by NS but she didn't start due to concern over swallowing.   She has been working on [employer]. She is bending her legs more. She fatigues easily.  She would like an electric hospital bed. Therapy stopped coming out to the house a few weeks ago.     Pain Inventory Average Pain 2 Pain Right Now 5 My pain is constant, burning, tingling and aching  In the last 24 hours, has pain interfered with the following? General activity NA Relation with others NA Enjoyment of life NA What TIME of day is your pain at its worst? NA Sleep (in general) Good  Pain is worse with: sitting Pain improves with: NA Relief from Meds: NA  Mobility ability to climb steps?  no do you drive?  no use a wheelchair needs help with transfers  Function not employed: date last employed NA disabled: date disabled NA I need assistance with the following:  dressing and toileting Do you have any goals in this area?  yes  Neuro/Psych weakness numbness tingling spasms  Prior Studies CT/MRI  Physicians involved in your care Primary care . Neurosurgeon .   Family History  Problem Relation Age of Onset  . Hypertension Mother   . Diabetes Mother   . Hypertension Father   . Stroke Father    Social History   Social History  . Marital Status: Single    Spouse Name: N/A  . Number of Children: N/A  . Years of Education: N/A   Social History Main Topics  . Smoking status: Never Smoker   . Smokeless tobacco: Never Used  . Alcohol Use: No    . Drug Use: No  . Sexual Activity: Not Currently   Other Topics Concern  . None   Social History Narrative   Past Surgical History  Procedure Laterality Date  . Tonsillectomy    . Laminectomy N/A 12/14/2013    Procedure: THORACIC LAMINECTOMY FOR TUMOR THORACIC THREE;  Surgeon: Ashok Pall, MD;  Location: [place] [facility];  Service: Neurosurgery;  Laterality: N/A;  THORACIC LAMINECTOMY FOR TUMOR THORACIC THREE  . Posterior lumbar fusion 4 level N/A 12/30/2013    Procedure: Thoracic one-Thoracic five posterior thoracic fusion with pedicle screws;  Surgeon: Ashok Pall, MD;  Location: [place] [facility];  Service: Neurosurgery;  Laterality: N/A;  Thoracic one-Thoracic five posterior thoracic fusion with pedicle screws  . Thyroidectomy N/A 01/09/2014    Procedure: TOTAL THYROIDECTOMY;  Surgeon: Izora Gala, MD;  Location: [place];  Service: ENT;  Laterality: N/A;  . Laminectomy N/A 07/05/2015    Procedure: THORACIC LAMINECTOMY FOR TUMOR;  Surgeon: Ashok Pall, MD;  Location: [place] [facility]UR[facility] ORS;  Service: Neurosurgery;  Laterality: N/A;   Past Medical History  Diagnosis Date  . Hypertension   . DDD (degenerative disc disease), cervical   . DJD (degenerative joint disease)   . Obesity   . Chronic back pain   . Cancer [facility])     FNA of  thyroid positive for onconytic/hurthle cell carcinoma  . History of rectal fissure   . Rotator cuff tendonitis right  . Pulmonary embolus, right ([place]) 2015  . HIT (heparin-induced thrombocytopenia) (HCC)    BP 113/83 mmHg  Pulse 102  Resp 16  SpO2 96%  LMP  (Within Months)  Opioid Risk Score:   Fall Risk Score:  `1  Depression screen PHQ 2/9  Depression screen [facility] 2/9 09/04/2015 12/26/2014 09/19/2014 04/20/2014 03/03/2014  Decreased Interest 0 0 0 0 0  Down, Depressed, Hopeless 0 1 1 0 1  PHQ - 2 Score 0 1 1 0 1  Altered sleeping 0 - 0 - -  Tired, decreased energy 0 - 1 - -  Change in appetite 0 - 0 - -  Feeling bad or failure about yourself  0 - 0 - -   Trouble concentrating 0 - 0 - -  Moving slowly or fidgety/restless 0 - 0 - -  Suicidal thoughts 0 - 0 - -  PHQ-9 Score 0 - 2 - -  Difficult doing work/chores Not difficult at all - - - -     Review of Systems  Neurological: Positive for weakness and numbness.       Tingling  Spasms    All other systems reviewed and are negative.      Objective:   Physical Exam  Constitutional: She appears well-developed. NAD. Obese  HENT: Normocephalic and atraumatic.  Eyes: Conjunctivae and EOM are normal.  Cardiovascular: Normal rate and regular rhythm.No murmur heard. Respiratory: Effort normal. No stridor. She has no wheezes.  GI: Soft. Bowel sounds are normal. She exhibits no distension. There is no tenderness.  Musculoskeletal: She exhibits no edema or tenderness.  Neurological: She is alert and oriented.  Flat but speech clear and able to follow commands without difficulty.  BUE 5/5 proximal to distal.  RLE: hip flexion, knee extension 3-/5, ankle dorsi/plantar flexion 4/5 LLE: 2/5 proximal to distal  Extensor tone in lower extremities Skin: Skin is warm and dry.  Upper thoracic incision clean/sutures. No drainage today again Psychiatric: Her speech is normal and behavior is normal. Judgment and thought content normal. Cognition and memory are normal.       Assessment & Plan:  1. 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 2. H/o PE/Anticoagulation: Pharmaceutical: Xarelto,  3. Pain Management: Controlled currently on oxycodone prn. -initiate gabapentin per NS direction  -she will need to bring in bottle at next visit 4. Metastatic cancer with T3 pathologic FX with cord compression and new osseous mets T12- L3/S1:  -per onc 5. Spasticity:  Continue baclofen 20 mg 3 X day with valium prn. Well controlled at present - zanaflex now at '2mg'$  tid and '4mg'$  qhs --this has been effective 6. Neurogenic bowel : changed to pm bowel program per pt preference -colace in am   16. Neurogenic bladder: emptying bladder  Thirty minutes of face to face patient care time were spent during this visit. All questions were encouraged and answered. Follow up in about 6 weeks.

## 2015-09-04 NOTE — Patient Instructions (Signed)
START GABAPENTIN AS DIRECTED BY CABBELL

## 2015-09-05 ENCOUNTER — Telehealth: Payer: Self-pay | Admitting: Physical Medicine & Rehabilitation

## 2015-09-05 NOTE — Telephone Encounter (Signed)
Please make referral for [facility]- PT and bed. Please and thank you

## 2015-09-05 NOTE — Telephone Encounter (Signed)
Patient called about referral about Hospital Bed that needs to be sent to [place], their number is [phone].  Please let patient know when this is done, her number is [phone].

## 2015-09-08 ENCOUNTER — Other Ambulatory Visit: Payer: Self-pay | Admitting: Family Medicine

## 2015-09-10 ENCOUNTER — Ambulatory Visit: Payer: BLUE CROSS/BLUE SHIELD | Admitting: Podiatry

## 2015-09-11 ENCOUNTER — Telehealth: Payer: Self-pay | Admitting: *Deleted

## 2015-09-11 MED ORDER — FLUCONAZOLE 200 MG PO TABS: 200.0000 mg | ORAL_TABLET | Freq: Every day | ORAL | Status: DC

## 2015-09-11 NOTE — Progress Notes (Signed)
[name] Kitchen    HEMATOLOGY/ONCOLOGY CONSULTATION NOTE  Date of Service:08/28/2015   Patient Care Team: [place] Leatherwood, MD as PCP - General (Family Medicine)   Dayton Bailiff MD (Spine Surgery - [place]) [phone]. 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  01/09/2014- patient underwent total thyroidectomy by Dr. Elie Goody. Pathology showed Hurthle cell carcinoma with no perineural invasion, presence of lymphovascular invasion and extra thyroidal extension. p T3, p N0, Mx  Received 30 grays in 10 fractions to her C7-T6 lesion finished in November 2015  Received 135 mCi of iodine 131 on 07/21/2014. Whole-body scan was apparently negative.  07/05/2015-07/10/2015 admitted to [facility] for leg weakness. MRI of the T-spine showed recurrent tumor causing spinal cord compression. Patient was emergently taken to the or and underwent a redo thoracic laminectomy of T3 and partial tumor resection.   08/07/2015 PET/CT scan of the chest abdomen pelvis showed slight enlargement of FDG avid left level III and level IV lymph nodes concerning for worsening nodal metastases. Slight enlargement of the hypermetabolic right superior mediastinal mass and multiple metabolically active pulmonary nodules representing worsening metastases. T3 vertebral body and bilateral pedicle metastases status post posture metallic fusion. No residual uptake in the thyroidectomy bed  Thyroglobulin level was 11.7 (thyroglobulin antibody present 9.8)   HISTORY OF PRESENTING ILLNESS:   Sarah [name] is a wonderful 50 y.o. female who has been referred to [place] by Dr  Leamon Arnt (medical oncology at [facility]) for help with local comanagement of the patient's metastatic Hurthle cell thyroid carcinoma with details as noted above. Patient had recent hospitalization from 4/13/7 to 07/10/2015 at [facility] for lower extremity weakness and was noted to have cord compression from recurrent tumor at T3. She is status post redo thoracic laminectomy of T3 and partial tumor resection to try to decompress the core. Pathology was consistent with metastatic Hurthle cell carcinoma.  She has been on dexamethasone 2 mg by mouth twice a day. Continue to have lower extremity weakness and is currently in a skilled nursing facility getting rehabilitation. She feels her lower extremity weakness is possibly getting somewhat better.  Patient had a repeat PET/CT scan at [facility] on 08/07/2015 as noted above which shows progression of her metastatic disease including nodal metastases , pulmonary nodules and right superior mediastinal mass.  MRI of the spine on 08/14/2015 showed improved but persistent cord compression at T3 level.  She was started by Dr. Leamon Arnt on Lenvatinib and 14 mg by mouth daily for progressive metastatic disease after ruling out the option of repeat surgery with Dr. Cyndy Freeze and reradiation with Dr. Ledon Snare.  After a week or so the patient was increased to a dose of '18mg'$  po daily. She notes no diarrhea or other gastrointestinal symptoms. No skin rashes.  Is noted to have oral thrush and was started on nystatin swish and swallow.   Patient has a history of pulmonary embolism 2015 and has continued to be on Xarelto for anticoagulation.  MEDICAL HISTORY:  Past Medical History  Diagnosis Date  . Hypertension   . DDD (degenerative disc disease), cervical   . DJD (degenerative  joint disease)   . Obesity   . Chronic back pain   . Cancer [facility])     FNA of thyroid positive for onconytic/hurthle cell carcinoma  . History of rectal fissure   .  Rotator cuff tendonitis right  . Pulmonary embolus, right ([place]) 2015  . HIT (heparin-induced thrombocytopenia) ([place])     SURGICAL HISTORY: Past Surgical History  Procedure Laterality Date  . Tonsillectomy    . Laminectomy N/A 12/14/2013    Procedure: THORACIC LAMINECTOMY FOR TUMOR THORACIC THREE;  Surgeon: Ashok Pall, MD;  Location: [place] NEURO ORS;  Service: Neurosurgery;  Laterality: N/A;  THORACIC LAMINECTOMY FOR TUMOR THORACIC THREE  . Posterior lumbar fusion 4 level N/A 12/30/2013    Procedure: Thoracic one-Thoracic five posterior thoracic fusion with pedicle screws;  Surgeon: Ashok Pall, MD;  Location: [place] NEURO ORS;  Service: Neurosurgery;  Laterality: N/A;  Thoracic one-Thoracic five posterior thoracic fusion with pedicle screws  . Thyroidectomy N/A 01/09/2014    Procedure: TOTAL THYROIDECTOMY;  Surgeon: Izora Gala, MD;  Location: [place];  Service: ENT;  Laterality: N/A;  . Laminectomy N/A 07/05/2015    Procedure: THORACIC LAMINECTOMY FOR TUMOR;  Surgeon: Ashok Pall, MD;  Location: [place] NEURO ORS;  Service: Neurosurgery;  Laterality: N/A;    SOCIAL HISTORY: Social History   Social History  . Marital Status: Single    Spouse Name: N/A  . Number of Children: N/A  . Years of Education: N/A   Occupational History  . Not on file.   Social History Main Topics  . Smoking status: Never Smoker   . Smokeless tobacco: Never Used  . Alcohol Use: No  . Drug Use: No  . Sexual Activity: Not Currently   Other Topics Concern  . Not on file   Social History Narrative    FAMILY HISTORY: Family History  Problem Relation Age of Onset  . Hypertension Mother   . Diabetes Mother   . Hypertension Father   . Stroke Father     ALLERGIES:  is allergic to bee venom; keflex; tramadol; heparin; hydrocodone; and oxycodone-acetaminophen.  MEDICATIONS:  Current Outpatient Prescriptions  Medication Sig Dispense Refill  . bisacodyl (DULCOLAX) 10 MG suppository Daily in evening after supper  30 suppository 0  . lenvatinib 18 mg daily dose (LENVIMA) 10 & 4 (2) MG capsule     . levothyroxine (SYNTHROID, LEVOTHROID) 200 MCG tablet Take 200 mcg by mouth daily before breakfast.    . ondansetron (ZOFRAN) 4 MG tablet Take 1 tablet (4 mg total) by mouth every 6 (six) hours. 30 tablet 0  . rivaroxaban (XARELTO) 20 MG TABS tablet Take 1 tablet (20 mg total) by mouth daily. 60 tablet 4  . senna (SENOKOT) 8.6 MG TABS tablet Take 2 tablets (17.2 mg total) by mouth daily. 120 each 0  . sertraline (ZOLOFT) 25 MG tablet Take 1 tablet (25 mg total) by mouth daily. 30 tablet 5  . tiZANidine (ZANAFLEX) 2 MG tablet Take 1-2 tablets (2-4 mg total) by mouth 4 (four) times daily -  before meals and at bedtime. Use one pill at 7am, noon and at 6 pm. Use two pills at bedtime 30 tablet 0  . zinc oxide (BALMEX) 11.3 % CREA cream Apply 1 application topically daily as needed. Apply to rectum    . baclofen (LIORESAL) 20 MG tablet Take 1 tablet (20 mg total) by mouth 3 (three) times daily. 90 each 3  . nystatin (MYCOSTATIN) 100000 UNIT/ML suspension Take 5 mLs (500,000 Units total)  by mouth 4 (four) times daily. 200 mL 0  . pantoprazole (PROTONIX) 40 MG tablet TAKE 1 TABLET ONCE A DAY 30 tablet 0   No current facility-administered medications for this visit.    REVIEW OF SYSTEMS:    10 Point review of Systems was done is negative except as noted above.  PHYSICAL EXAMINATION: ECOG PERFORMANCE STATUS: 3 - Symptomatic, >50% confined to bed  . Filed Vitals:   08/28/15 1358  BP: 123/87  Pulse: 87  Temp: 98.5 F (36.9 C)  Resp: 17   Filed Weights   .There is no weight on file to calculate BMI.  GENERAL:alert, in no acute distress and comfortable, sitting in the wheelchair  SKIN: skin color, texture, turgor are normal, no rashes or significant lesions EYES: normal, conjunctiva are pink and non-injected, sclera clear OROPHARYNX: Noted to have oral thrush NECK: supple, no JVD, thyroid normal size,  non-tender, without nodularity LYMPH:  no palpable lymphadenopathy in the cervical, axillary or inguinal LUNGS: clear to auscultation with normal respiratory effort HEART: regular rate & rhythm,  no murmurs and no lower extremity edema ABDOMEN: abdomen soft, non-tender, normoactive bowel sounds  Musculoskeletal: no cyanosis of digits and no clubbing  PSYCH: alert & oriented x 3 with fluent speech NEURO: Decreased bilateral lower extremity strength  LABORATORY DATA:  I have reviewed the data as listed  . CBC Latest Ref Rng 08/28/2015 07/12/2015 07/07/2015  WBC 3.9 - 10.3 10e3/uL 3.5(L) 9.8 10.8(H)  Hemoglobin 11.6 - 15.9 g/dL 12.9 12.7 10.9(L)  Hematocrit 34.8 - 46.6 % 37.0 38.6 31.0(L)  Platelets 145 - 400 10e3/uL 102(L) 234 207    . CMP Latest Ref Rng 08/28/2015 07/12/2015 07/07/2015  Glucose 70 - 140 mg/dl 95 121(H) 156(H)  BUN 7.0 - 26.0 mg/dL 11.'7 17 17  '$ Creatinine 0.6 - 1.1 mg/dL 1.1 0.91 1.08(H)  Sodium 136 - 145 mEq/L 143 137 139  Potassium 3.5 - 5.1 mEq/L 3.7 4.6 4.3  Chloride 101 - 111 mmol/L - 100(L) 104  CO2 22 - 29 mEq/L '27 26 23  '$ Calcium 8.4 - 10.4 mg/dL 8.6 7.9(L) 8.2(L)  Total Protein 6.4 - 8.3 g/dL 6.1(L) 5.6(L) -  Total Bilirubin 0.20 - 1.20 mg/dL 1.74(H) 0.8 -  Alkaline Phos 40 - 150 U/L 110 90 -  AST 5 - 34 U/L 33 11(L) -  ALT 0 - 55 U/L 47 12(L) -     RADIOGRAPHIC STUDIES: I have personally reviewed the radiological images as listed and agreed with the findings in the report. Mr Thoracic Spine W Wo Contrast  08/14/2015  CLINICAL DATA:  Malignant neoplasm of thyroid gland. T3 metastasis with cord compression and spastic paraparesis. Tumor section 01/09/2014 with chemo radiotherapy. Recurring cord compression seen 07/05/2015 with emergent decompression. EXAM: MRI THORACIC SPINE WITHOUT AND WITH CONTRAST TECHNIQUE: Multiplanar and multiecho pulse sequences of the thoracic spine were obtained without and with intravenous contrast. CONTRAST:  55m MULTIHANCE GADOBENATE  DIMEGLUMINE 529 MG/ML IV SOLN COMPARISON:  07/05/2015 FINDINGS: Known T3 metastasis with chronic pathologic compression fracture and bulky epidural infiltration by tumor. There has been decompressive laminectomy and posterior fixation from T1-T5. After most recent debulking, canal patency is improved, but there is still transverse narrowing of the thecal sac with cord compression. Cord at the level of the compression is T2 hyperintense without definitive swelling when allowing for distortion by compression. There is lateral tract hyperintensity below the compression and dorsal column hyperintensity above the compression attributed to Wallerian degeneration. There is dorsal epidural fluid which  is commonly seen postsurgically, noncompressive on the thecal sac. The collection does not appear under pressure. No new metastatic focus is seen (sclerotic changes at the anterior vertebral plexus entry to L1 is again noted, better seen on recent lumbar spine imaging). T7-8 central disc protrusion with up turning and ventral cord flattening, stable. History pulmonary metastases with lung nodules seen on the left. IMPRESSION: 1. Improved thecal sac patency after T3 epidural tumor debulking. There is improved but persistent cord compression at this level. Cord signal abnormality at T3 with wallerian tract degeneration above and below. 2. Known pulmonary metastases. 3. Fluid collection in the laminectomy defect which appears low pressure. Electronically Signed   By: Monte Fantasia M.D.   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 Patient has had treatment as noted above. Recently had cord compression at T3 level due to recurrent tumor status post partial resection and debulking. Recent MRI on 08/14/2015 showed improved but persistent cord compression at  the T3 level. She has been noted not to be a candidate for further repeat surgery at this time or repeat radiation. Recent PET/CT scan is noted of bowel done at [facility] on 08/07/2015 showed evidence of tumor progression and patient was started on lenvatinib as per Dr. Leamon Arnt. She has advanced from 14 mg a day to '18mg'$  po daily Her last thyroglobulin level was 11.7 with anti-body of 9.8. Plan -Continue Lenvatinib at '18mg'$  po daily at this time. -She has now been tapered off her steroids. -continue rehabilitation at her skilled nursing facility -Has a follow-up with Dr. Leamon Arnt in about 2-3 months with repeat PET/CT scan . -We shall see her back in about 4 weeks with repeat labs to evaluate for any evolving toxicities . -We discussed the toxicities that she needs to watch for . -Continue follow-up with Dr. Cyndy Freeze and Dr. [name] as per their recommendations   2) oral thrush likely related to her steroids -Given prescription for nystatin to minimize interaction with her Lenvatinib  3) history of right-sided pulmonary embolism on chronic anticoagulation with Rivaroxaban. Monitor for bleeding.  Return to care with [name] in 4 weeks with CBC, CMP, thyroglobulin, thyroglobulin antibody and EKG  All of the patients questions were answered with apparent satisfaction. The patient knows to call the clinic with any problems, questions or concerns.  I spent 60 minutes counseling the patient face to face. The total time spent in the appointment was 60 minutes and more than 50% was on counseling and direct patient cares.    Sullivan Lone MD [place] AAHIVMS [facility] Hematology/Oncology Physician [facility]  (Office):       [phone] (Work cell):  [phone] (Fax):           [phone]  09/11/2015 5:22 PM

## 2015-09-11 NOTE — Telephone Encounter (Signed)
Pt called stating that she called yest regarding thrush & she is no better.  Returned call & pt reports that she is using nystatin qid & swishing & swallowing since 08/29/15 & doesn't feel like thrush is better.  She reports that she feels like she runs a fever at hs but hasn't taken temp but reports that home health RN took today & reports no fever.  Discussed with Dr Irene Limbo & OK to order Diflucan x 14 days but if not better to call back.  Informed pt of this & script sent to pharmacy.  Pt asked if she needs carafate which was given to her in radiation.  Informed that she may if this is mucositis & if diflucan doesn't improve symptoms, she should call & ask about carafate.  Pt expressed understanding.

## 2015-09-12 ENCOUNTER — Other Ambulatory Visit: Payer: Self-pay | Admitting: Physical Medicine and Rehabilitation

## 2015-09-20 ENCOUNTER — Telehealth: Payer: Self-pay

## 2015-09-20 NOTE — Telephone Encounter (Signed)
Pt states that she can not get her electronic hospital bed from [facility]. She would like the order to go though [facility] in [place]. Can you please adjust the old order or help make a new one for pt?

## 2015-09-21 ENCOUNTER — Ambulatory Visit (INDEPENDENT_AMBULATORY_CARE_PROVIDER_SITE_OTHER): Payer: BLUE CROSS/BLUE SHIELD | Admitting: Podiatry

## 2015-09-21 ENCOUNTER — Encounter: Payer: Self-pay | Admitting: Podiatry

## 2015-09-21 VITALS — BP 114/83 | HR 74 | Resp 16

## 2015-09-21 DIAGNOSIS — M79675 Pain in left toe(s): Secondary | ICD-10-CM | POA: Diagnosis not present

## 2015-09-21 DIAGNOSIS — B351 Tinea unguium: Secondary | ICD-10-CM | POA: Diagnosis not present

## 2015-09-21 DIAGNOSIS — M79605 Pain in left leg: Secondary | ICD-10-CM

## 2015-09-21 DIAGNOSIS — M79674 Pain in right toe(s): Secondary | ICD-10-CM

## 2015-09-21 DIAGNOSIS — M79604 Pain in right leg: Secondary | ICD-10-CM

## 2015-09-21 NOTE — Progress Notes (Signed)
Subjective:     Patient ID: Sarah [name], female   DOB: November 04, 1965, 50 y.o.   MRN: [id number]  HPI patient presents with painful nailbeds of both feet and long-term poor health and is currently in a wheelchair and not able to take care of her nails with soreness   Review of Systems  All other systems reviewed and are negative.      Objective:   Physical Exam  Constitutional: She is oriented to person, place, and time.  Cardiovascular: Intact distal pulses.   Musculoskeletal: Normal range of motion.  Neurological: She is oriented to person, place, and time.  Skin: Skin is warm and dry.  Nursing note and vitals reviewed.  neurovascular status diminished at this time but intact with diminished range of motion and diminished muscle strength. Patient's found to have elongated nailbeds 1-5 bilateral with incurvation of the beds pain and abnormal position of the toenails with thickness noted and brittle-like appearance. Patient does have reasonable digital perfusion and is well oriented 3 with caregiver     Assessment:     Ingrown mycotic nails that are painful 1-5 both feet in patient who is in wheelchair and cannot take care of    Plan:     H&P condition discussed and debrided nailbeds 1-5 both feet with no iatrogenic bleeding noted

## 2015-09-21 NOTE — Progress Notes (Signed)
   Subjective:    Patient ID: Sarah [name], female    DOB: 1965-09-29, 50 y.o.   MRN: [id number]  HPI    Review of Systems  Constitutional: Positive for diaphoresis and fatigue.  Cardiovascular: Positive for leg swelling.  All other systems reviewed and are negative.      Objective:   Physical Exam        Assessment & Plan:

## 2015-10-01 ENCOUNTER — Other Ambulatory Visit: Payer: BLUE CROSS/BLUE SHIELD

## 2015-10-01 ENCOUNTER — Telehealth: Payer: Self-pay | Admitting: Hematology

## 2015-10-01 ENCOUNTER — Ambulatory Visit: Payer: BLUE CROSS/BLUE SHIELD | Admitting: Hematology

## 2015-10-01 NOTE — Telephone Encounter (Signed)
Patient called and rescheduled 7/10 appointments to 7/17 due to not feeling well. patient has new date/time.

## 2015-10-07 DIAGNOSIS — C73 Malignant neoplasm of thyroid gland: Secondary | ICD-10-CM | POA: Diagnosis not present

## 2015-10-07 DIAGNOSIS — G8220 Paraplegia, unspecified: Secondary | ICD-10-CM | POA: Diagnosis not present

## 2015-10-07 DIAGNOSIS — M5104 Intervertebral disc disorders with myelopathy, thoracic region: Secondary | ICD-10-CM | POA: Diagnosis not present

## 2015-10-07 DIAGNOSIS — C7951 Secondary malignant neoplasm of bone: Secondary | ICD-10-CM | POA: Diagnosis not present

## 2015-10-08 ENCOUNTER — Telehealth: Payer: Self-pay | Admitting: Hematology

## 2015-10-08 ENCOUNTER — Encounter: Payer: Self-pay | Admitting: Hematology

## 2015-10-08 ENCOUNTER — Other Ambulatory Visit (HOSPITAL_BASED_OUTPATIENT_CLINIC_OR_DEPARTMENT_OTHER): Payer: BLUE CROSS/BLUE SHIELD | Admitting: *Deleted

## 2015-10-08 ENCOUNTER — Other Ambulatory Visit (HOSPITAL_BASED_OUTPATIENT_CLINIC_OR_DEPARTMENT_OTHER): Payer: BLUE CROSS/BLUE SHIELD

## 2015-10-08 ENCOUNTER — Ambulatory Visit (HOSPITAL_BASED_OUTPATIENT_CLINIC_OR_DEPARTMENT_OTHER): Payer: BLUE CROSS/BLUE SHIELD | Admitting: Hematology

## 2015-10-08 VITALS — BP 140/89 | HR 82 | Temp 98.3°F | Resp 18 | Ht 71.0 in

## 2015-10-08 DIAGNOSIS — B370 Candidal stomatitis: Secondary | ICD-10-CM | POA: Diagnosis not present

## 2015-10-08 DIAGNOSIS — C73 Malignant neoplasm of thyroid gland: Secondary | ICD-10-CM

## 2015-10-08 DIAGNOSIS — Z86711 Personal history of pulmonary embolism: Secondary | ICD-10-CM

## 2015-10-08 DIAGNOSIS — C7951 Secondary malignant neoplasm of bone: Secondary | ICD-10-CM

## 2015-10-08 DIAGNOSIS — Z7901 Long term (current) use of anticoagulants: Secondary | ICD-10-CM

## 2015-10-08 DIAGNOSIS — C7800 Secondary malignant neoplasm of unspecified lung: Secondary | ICD-10-CM

## 2015-10-08 LAB — CBC & DIFF AND RETIC
BASO%: 0.4 % (ref 0.0–2.0)
BASOS ABS: 0 10*3/uL (ref 0.0–0.1)
EOS%: 5 % (ref 0.0–7.0)
Eosinophils Absolute: 0.1 10*3/uL (ref 0.0–0.5)
HEMATOCRIT: 35.3 % (ref 34.8–46.6)
HGB: 12.2 g/dL (ref 11.6–15.9)
IMMATURE RETIC FRACT: 10.5 % — AB (ref 1.60–10.00)
LYMPH#: 0.9 10*3/uL (ref 0.9–3.3)
LYMPH%: 31 % (ref 14.0–49.7)
MCH: 30.6 pg (ref 25.1–34.0)
MCHC: 34.6 g/dL (ref 31.5–36.0)
MCV: 88.5 fL (ref 79.5–101.0)
MONO#: 0.2 10*3/uL (ref 0.1–0.9)
MONO%: 6.8 % (ref 0.0–14.0)
NEUT#: 1.6 10*3/uL (ref 1.5–6.5)
NEUT%: 56.8 % (ref 38.4–76.8)
PLATELETS: 213 10*3/uL (ref 145–400)
RBC: 3.99 10*6/uL (ref 3.70–5.45)
RDW: 16.2 % — ABNORMAL HIGH (ref 11.2–14.5)
RETIC CT ABS: 68.63 10*3/uL (ref 33.70–90.70)
Retic %: 1.72 % (ref 0.70–2.10)
WBC: 2.8 10*3/uL — ABNORMAL LOW (ref 3.9–10.3)

## 2015-10-08 LAB — COMPREHENSIVE METABOLIC PANEL
ALK PHOS: 96 U/L (ref 40–150)
ANION GAP: 11 meq/L (ref 3–11)
AST: 13 U/L (ref 5–34)
Albumin: 2.6 g/dL — ABNORMAL LOW (ref 3.5–5.0)
BUN: 5.6 mg/dL — AB (ref 7.0–26.0)
CALCIUM: 8.5 mg/dL (ref 8.4–10.4)
CO2: 26 meq/L (ref 22–29)
CREATININE: 0.9 mg/dL (ref 0.6–1.1)
Chloride: 105 mEq/L (ref 98–109)
EGFR: 79 mL/min/{1.73_m2} — ABNORMAL LOW (ref >90)
Glucose: 86 mg/dl (ref 70–140)
Potassium: 3.6 mEq/L (ref 3.5–5.1)
Sodium: 142 mEq/L (ref 136–145)
TOTAL PROTEIN: 6.3 g/dL — AB (ref 6.4–8.3)
Total Bilirubin: 1.37 mg/dL — ABNORMAL HIGH (ref 0.20–1.20)

## 2015-10-08 LAB — TSH: TSH: 11.433 m(IU)/L — ABNORMAL HIGH (ref 0.308–3.960)

## 2015-10-08 NOTE — Telephone Encounter (Signed)
per pof to sch pt appt-gave pt copy of avs-adv central sch will call to sch scan

## 2015-10-09 LAB — THYROGLOBULIN ANTIBODY: THYROGLOBULIN ANTIBODY: 5.2 [IU]/mL — AB (ref 0.0–0.9)

## 2015-10-09 NOTE — Progress Notes (Signed)
[name] Kitchen    HEMATOLOGY/ONCOLOGY CLINIC NOTE  Date of Service: .10/08/2015  Patient Care Team: Elberta Leatherwood, MD as PCP - General (Family Medicine)   Dayton Bailiff MD (Spine Surgery - [place]) [phone]. 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  01/09/2014- patient underwent total thyroidectomy by Dr. Elie Goody. Pathology showed Hurthle cell carcinoma with no perineural invasion, presence of lymphovascular invasion and extra thyroidal extension. p T3, p N0, Mx  Received 30 grays in 10 fractions to her C7-T6 lesion finished in November 2015  Received 135 mCi of iodine 131 on 07/21/2014. Whole-body scan was apparently negative.  07/05/2015-07/10/2015 admitted to [facility] for leg weakness. MRI of the T-spine showed recurrent tumor causing spinal cord compression. Patient was emergently taken to the or and underwent a redo thoracic laminectomy of T3 and partial tumor resection.   08/07/2015 PET/CT scan of the chest abdomen pelvis showed slight enlargement of FDG avid left level III and level IV lymph nodes concerning for worsening nodal metastases. Slight enlargement of the hypermetabolic right superior mediastinal mass and multiple metabolically active pulmonary nodules representing worsening metastases. T3 vertebral body and bilateral pedicle metastases status post posture metallic fusion. No residual uptake in the thyroidectomy bed  Thyroglobulin level was 11.7 (thyroglobulin antibody present 9.8)   HISTORY OF PRESENTING ILLNESS:   Sarah [name] is a wonderful 50 y.o. female who has been referred to [place] by Dr Leamon Arnt  (medical oncology at [facility]) for help with local comanagement of the patient's metastatic Hurthle cell thyroid carcinoma with details as noted above. Patient had recent hospitalization from 4/13/7 to 07/10/2015 at [facility] for lower extremity weakness and was noted to have cord compression from recurrent tumor at T3. She is status post redo thoracic laminectomy of T3 and partial tumor resection to try to decompress the core. Pathology was consistent with metastatic Hurthle cell carcinoma.  She has been on dexamethasone 2 mg by mouth twice a day. Continue to have lower extremity weakness and is currently in a skilled nursing facility getting rehabilitation. She feels her lower extremity weakness is possibly getting somewhat better.  Patient had a repeat PET/CT scan at [facility] on 08/07/2015 as noted above which shows progression of her metastatic disease including nodal metastases , pulmonary nodules and right superior mediastinal mass.  MRI of the spine on 08/14/2015 showed improved but persistent cord compression at T3 level.  She was started by Dr. Leamon Arnt on Lenvatinib and 14 mg by mouth daily for progressive metastatic disease after ruling out the option of repeat surgery with Dr. Cyndy Freeze and reradiation with Dr. Ledon Snare.  After a week or so the patient was increased to a dose of '18mg'$  po daily. She notes no diarrhea or other gastrointestinal symptoms. No skin rashes.  Is noted to have oral thrush and was started on nystatin swish and swallow.   Patient has a history of pulmonary embolism 2015 and has continued to be on Xarelto for anticoagulation.   INTERVAL HISTORY  [name] here for her scheduled followup for her metastatic Hurthle cell carcinoma. She reports that she has been compliant with  her Lenvatinib @ '18mg'$  by mouth daily.  No overt nausea vomiting or diarrhea.  Has developed grade 2 oral mucositis which she feels is relatively tolerable.  Bothers her  when eating spicy or salty foods but is otherwise able to eat well.  No new worsening back pains.  Breathing is stable.  Notes some improvement in lower extremity strength.  Accompanied by her mother. Notes that she strained her left shoulder while working with physical therapy and she will be seeing orthopedics on Monday for this.  Notes that she has previously required a cortisone shot in her left shoulder. She is requesting that her next followup PET CT scan be done here in [place] as opposed to at [facility]. She notes that she would need to followup with Dr. Christella Noa sometime next month for a repeat MRI of her spine.   MEDICAL HISTORY:  Past Medical History  Diagnosis Date  . Hypertension   . DDD (degenerative disc disease), cervical   . DJD (degenerative joint disease)   . Obesity   . Chronic back pain   . Cancer [facility])     FNA of thyroid positive for onconytic/hurthle cell carcinoma  . History of rectal fissure   . Rotator cuff tendonitis right  . Pulmonary embolus, right ([place]) 2015  . HIT (heparin-induced thrombocytopenia) ([place])     SURGICAL HISTORY: Past Surgical History  Procedure Laterality Date  . Tonsillectomy    . Laminectomy N/A 12/14/2013    Procedure: THORACIC LAMINECTOMY FOR TUMOR THORACIC THREE;  Surgeon: Ashok Pall, MD;  Location: [place] [facility]UR[facility] ORS;  Service: Neurosurgery;  Laterality: N/A;  THORACIC LAMINECTOMY FOR TUMOR THORACIC THREE  . Posterior lumbar fusion 4 level N/A 12/30/2013    Procedure: Thoracic one-Thoracic five posterior thoracic fusion with pedicle screws;  Surgeon: Ashok Pall, MD;  Location: [place] NEURO ORS;  Service: Neurosurgery;  Laterality: N/A;  Thoracic one-Thoracic five posterior thoracic fusion with pedicle screws  . Thyroidectomy N/A 01/09/2014    Procedure: TOTAL THYROIDECTOMY;  Surgeon: Izora Gala, MD;  Location: [place];  Service: ENT;  Laterality: N/A;  . Laminectomy N/A 07/05/2015    Procedure: THORACIC LAMINECTOMY FOR TUMOR;  Surgeon: Ashok Pall, MD;  Location: [place] NEURO ORS;  Service: Neurosurgery;  Laterality: N/A;    SOCIAL HISTORY: Social History   Social History  . Marital Status: Single    Spouse Name: N/A  . Number of Children: N/A  . Years of Education: N/A   Occupational History  . Not on file.   Social History Main Topics  . Smoking status: Never Smoker   . Smokeless tobacco: Never Used  . Alcohol Use: No  . Drug Use: No  . Sexual Activity: Not Currently   Other Topics Concern  . Not on file   Social History Narrative    FAMILY HISTORY: Family History  Problem Relation Age of Onset  . Hypertension Mother   . Diabetes Mother   . Hypertension Father   . Stroke Father     ALLERGIES:  is allergic to bee venom; keflex; tramadol; heparin; hydrocodone; and oxycodone-acetaminophen.  MEDICATIONS:  Current Outpatient Prescriptions  Medication Sig Dispense Refill  . baclofen (LIORESAL) 20 MG tablet Take 1 tablet (20 mg total) by mouth 3 (three) times daily. 90 tablet 0  . bisacodyl (DULCOLAX) 10 MG suppository Daily in evening after supper 30 suppository 0  . fluconazole (DIFLUCAN) 200 MG tablet Take 1 tablet (200 mg total) by mouth daily. 14 tablet 0  . lenvatinib 18 mg  daily dose (LENVIMA) 10 & 4 (2) MG capsule     . levothyroxine (SYNTHROID, LEVOTHROID) 200 MCG tablet Take 200 mcg by mouth daily before breakfast.    . nystatin (MYCOSTATIN) 100000 UNIT/ML suspension Take 5 mLs (500,000 Units total) by mouth 4 (four) times daily. 200 mL 0  . ondansetron (ZOFRAN) 4 MG tablet Take 1 tablet (4 mg total) by mouth every 6 (six) hours. 30 tablet 0  . pantoprazole (PROTONIX) 40 MG tablet TAKE 1 TABLET ONCE A DAY 30 tablet 0  . rivaroxaban (XARELTO) 20 MG TABS tablet Take 1 tablet (20 mg total) by mouth daily. 60 tablet 4  . senna (SENOKOT) 8.6 MG TABS tablet Take 2 tablets (17.2 mg total) by mouth daily. 120 each 0  . sertraline (ZOLOFT) 25 MG tablet Take 1 tablet (25 mg total) by mouth daily. 30 tablet 5    . tiZANidine (ZANAFLEX) 2 MG tablet Take 1-2 tablets (2-4 mg total) by mouth 4 (four) times daily -  before meals and at bedtime. Use one pill at 7am, noon and at 6 pm. Use two pills at bedtime 30 tablet 0  . zinc oxide (BALMEX) 11.3 % CREA cream Apply 1 application topically daily as needed. Apply to rectum     No current facility-administered medications for this visit.    REVIEW OF SYSTEMS:    10 Point review of Systems was done is negative except as noted above.  PHYSICAL EXAMINATION: ECOG PERFORMANCE STATUS: 3 - Symptomatic, >50% confined to bed  . Filed Vitals:   10/08/15 0952  BP: 140/89  Pulse: 82  Temp: 98.3 F (36.8 C)  Resp: 18   Filed Weights   .There is no weight on file to calculate BMI.  GENERAL:alert, in no acute distress and comfortable, sitting in the wheelchair  SKIN: skin color, texture, turgor are normal, no rashes or significant lesions EYES: normal, conjunctiva are pink and non-injected, sclera clear OROPHARYNX: grade 1-2 oral mucositis no overt thrush noted NECK: supple, no JVD, thyroid normal size, non-tender, without nodularity LYMPH:  no palpable lymphadenopathy in the cervical, axillary or inguinal LUNGS: clear to auscultation with normal respiratory effort HEART: regular rate & rhythm,  no murmurs and no lower extremity edema ABDOMEN: abdomen soft, non-tender, normoactive bowel sounds  Musculoskeletal: no cyanosis of digits and no clubbing , bilateral chronic 1-2+ pitting pedal edema PSYCH: alert & oriented x 3 with fluent speech NEURO: Decreased bilateral lower extremity strength    LABORATORY DATA:  I have reviewed the data as listed  . CBC Latest Ref Rng 10/08/2015 08/28/2015 07/12/2015  WBC 3.9 - 10.3 10e3/uL 2.8(L) 3.5(L) 9.8  Hemoglobin 11.6 - 15.9 g/dL 12.2 12.9 12.7  Hematocrit 34.8 - 46.6 % 35.3 37.0 38.6  Platelets 145 - 400 10e3/uL 213 102(L) 234    . CMP Latest Ref Rng 10/08/2015 08/28/2015 07/12/2015  Glucose 70 - 140 mg/dl 86  95 121(H)  BUN 7.0 - 26.0 mg/dL 5.6(L) 11.7 17  Creatinine 0.6 - 1.1 mg/dL 0.9 1.1 0.91  Sodium 136 - 145 mEq/L 142 143 137  Potassium 3.5 - 5.1 mEq/L 3.6 3.7 4.6  Chloride 101 - 111 mmol/L - - 100(L)  CO2 22 - 29 mEq/L '26 27 26  '$ Calcium 8.4 - 10.4 mg/dL 8.5 8.6 7.9(L)  Total Protein 6.4 - 8.3 g/dL 6.3(L) 6.1(L) 5.6(L)  Total Bilirubin 0.20 - 1.20 mg/dL 1.37(H) 1.74(H) 0.8  Alkaline Phos 40 - 150 U/L 96 110 90  AST 5 - 34 U/L 13  33 11(L)  ALT 0 - 55 U/L <9 47 12(L)     RADIOGRAPHIC STUDIES: I have personally reviewed the radiological images as listed and agreed with the findings in the report. No results found.  ASSESSMENT & PLAN:   50 year old female with   1) stage IVc metastatic Hurthle cell thyroid carcinoma with metastases to the spine, lung, superior mediastinal mass and nodal metastases. Patient has had treatment as noted above. Recently had cord compression at T3 level due to recurrent tumor status post partial resection and debulking. Recent MRI on 08/14/2015 showed improved but persistent cord compression at the T3 level. She has been noted not to be a candidate for further repeat surgery at this time or repeat radiation. Recent PET/CT scan is noted of bowel done at [facility] on 08/07/2015 showed evidence of tumor progression and patient was started on lenvatinib as per Dr. Leamon Arnt. Her last thyroglobulin level was 11.7 with anti-body of 9.8.  2) grade 2 oral mucositis related to Lenvatinib. Plan -patient blood counts and chemistry appeared to be stable. -the thyroglobulin level and thyroglobulin antibody levels are currently pending -She has relatively tolerable grade 1-2 oral mucositis and therefore we shall not advance the dose of her Lenvatinib beyound '18mg'$  po daily at this time. -she gets relief with Cepacol as needed and is able to eat fairly normally other than the fact that she has to avoid very salty or spicy foods. -Recommended she use salt and baking soda mouthwashes 4  times a day ( given the recipe for this) -continue rehabilitation at her skilled nursing facility -shall request PET/CT scan images from [facility]. -A repeat PET/CT scan in about 6 weeks for reevaluation of response. -We will need to see Dr. Christella Noa and consider rpt MRI spine in about 4-6 weeks. -continue followup with Dr. Leamon Arnt at [facility] in 2 months  2) oral thrush likely related to her steroids - now resolved. [name] now off steroids.  3) history of right-sided pulmonary embolism on chronic anticoagulation with Rivaroxaban. Monitor for bleeding.  4) Elevated TSH  -patient is on Synthroid for TSH suppression but appears to be suboptimal at this time considering the TSH is 11.4 - patient was following with Dr. Buddy Duty from endocrine hearing [facility] and Dr. Lanier Clam endocrinology at [facility]. -She notes that she is planning to see Dr Chalmers Cater (endocrine here in [place]). -she will be called with her results and needs to discuss levothyroxine dose adjustment with her endocrinologist.  Return to care with [name] in 6 weeks with CBC, CMP, thyroglobulin, thyroglobulin antibody and PET/CT scan.  All of the patients questions were answered with apparent satisfaction. The patient knows to call the clinic with any problems, questions or concerns.  I spent 30 minutes counseling the patient face to face. The total time spent in the appointment was 30 minutes and more than 50% was on counseling and direct patient cares.    Sullivan Lone MD [place] AAHIVMS [facility] Hematology/Oncology Physician [facility]  (Office):       [phone] (Work cell):  [phone] (Fax):           [phone]

## 2015-10-09 NOTE — Progress Notes (Signed)
Informed patient that her TSH level was 11.4 and that she needs to contact her endocrinologist asap for an adjustment. Patient verbalized understanding.

## 2015-10-10 ENCOUNTER — Telehealth: Payer: Self-pay | Admitting: *Deleted

## 2015-10-10 NOTE — Telephone Encounter (Signed)
"  I had labs drawn a few days ago and have an elevated TSH level.  Dr. Dara Lords at [facility] will be contacting [name] OR [name] can e-mail or call him to discuss the high levels."

## 2015-10-13 ENCOUNTER — Other Ambulatory Visit: Payer: Self-pay | Admitting: Physical Medicine & Rehabilitation

## 2015-10-15 ENCOUNTER — Encounter: Payer: Self-pay | Admitting: *Deleted

## 2015-10-15 LAB — THYROGLOBULIN LEVEL: THYROGLOBULIN (TG-RIA): 3 ng/mL

## 2015-10-15 NOTE — Progress Notes (Unsigned)
Faxed patient labs to [facility] endocrinologist

## 2015-10-16 ENCOUNTER — Other Ambulatory Visit: Payer: Self-pay | Admitting: Family Medicine

## 2015-10-16 ENCOUNTER — Telehealth: Payer: Self-pay | Admitting: *Deleted

## 2015-10-16 ENCOUNTER — Ambulatory Visit: Payer: BLUE CROSS/BLUE SHIELD | Admitting: Physical Medicine & Rehabilitation

## 2015-10-16 DIAGNOSIS — C73 Malignant neoplasm of thyroid gland: Secondary | ICD-10-CM

## 2015-10-16 NOTE — Telephone Encounter (Signed)
Referral sent 

## 2015-10-16 NOTE — Telephone Encounter (Signed)
Pt calling in wanting a referral for a endocrinologist (dr balen) on [address]. Please advise. Clatie Kessen Kennon Holter, CMA

## 2015-10-17 NOTE — Telephone Encounter (Signed)
[name] out of her Baclofen and needs a refill.  Please call when this is ready.

## 2015-10-18 NOTE — Telephone Encounter (Signed)
notified

## 2015-10-23 ENCOUNTER — Telehealth: Payer: Self-pay | Admitting: *Deleted

## 2015-10-23 NOTE — Telephone Encounter (Signed)
"  This is her sister, Magdalene River [phone]).  She is so hoarse you can't understand her.  The family would like to know why.  We're concerned if thyroid is pressing on her throat or if cancer is in her lymph nodes and has spread.  Something's going on.  It's like laryngitis.  She's using a mouthwash but not helping her voice.  Doesn't have thrush, just unable to speak clearly for the past week and a half."

## 2015-10-24 ENCOUNTER — Other Ambulatory Visit: Payer: Self-pay | Admitting: Hematology

## 2015-10-24 DIAGNOSIS — C7951 Secondary malignant neoplasm of bone: Secondary | ICD-10-CM

## 2015-10-24 DIAGNOSIS — C73 Malignant neoplasm of thyroid gland: Secondary | ICD-10-CM

## 2015-10-25 ENCOUNTER — Other Ambulatory Visit: Payer: Self-pay | Admitting: *Deleted

## 2015-10-26 ENCOUNTER — Telehealth: Payer: Self-pay | Admitting: *Deleted

## 2015-10-26 NOTE — Telephone Encounter (Signed)
Spoke with patient regarding previous message left by sister stating patient was having increased hoarseness and difficulty speaking.  Sister previously instructed to tell patient to stop lenvatinib until she could be assessed by either [name] or her PCP.  After speaking to the patient herself, patient was having no trouble speaking, and stated she was just "a little hoarse" no pain or other bothersome symptoms.  Per [name], patient should continue lenvatinib unless she is having pain or increased discomfort.  Patient informed, patient verbalized understanding.  Patient will let us know if symptoms worsen or if she feels she needs to be seen prior to 8/28 apt.

## 2015-11-01 ENCOUNTER — Ambulatory Visit (HOSPITAL_COMMUNITY)
Admission: RE | Admit: 2015-11-01 | Discharge: 2015-11-01 | Disposition: A | Discharge status: Home / Self Care | Payer: BLUE CROSS/BLUE SHIELD | Source: Ambulatory Visit | Attending: Hematology | Admitting: Hematology

## 2015-11-01 DIAGNOSIS — R590 Localized enlarged lymph nodes: Secondary | ICD-10-CM | POA: Insufficient documentation

## 2015-11-01 DIAGNOSIS — R911 Solitary pulmonary nodule: Secondary | ICD-10-CM | POA: Insufficient documentation

## 2015-11-01 DIAGNOSIS — R935 Abnormal findings on diagnostic imaging of other abdominal regions, including retroperitoneum: Secondary | ICD-10-CM | POA: Diagnosis not present

## 2015-11-01 DIAGNOSIS — C73 Malignant neoplasm of thyroid gland: Secondary | ICD-10-CM

## 2015-11-01 DIAGNOSIS — C7951 Secondary malignant neoplasm of bone: Secondary | ICD-10-CM | POA: Diagnosis not present

## 2015-11-01 LAB — GLUCOSE, CAPILLARY: Glucose-Capillary: 108 mg/dL — ABNORMAL HIGH (ref 65–99)

## 2015-11-01 MED ORDER — FLUDEOXYGLUCOSE F - 18 (FDG) INJECTION: 12.2000 | Freq: Once | INTRAVENOUS | Status: DC | PRN

## 2015-11-06 ENCOUNTER — Telehealth: Payer: Self-pay | Admitting: *Deleted

## 2015-11-06 NOTE — Telephone Encounter (Signed)
"  This is Psychologist, [facility] probation services.  We have not received results of Mallery's PET scan.  Has the doctor received this information?" Scan performed 11-01-2015.  Provider out of office through 11-13-2015. Request sent to collaborative and advised results will be given with next F/U 11-19-2015.

## 2015-11-13 ENCOUNTER — Encounter
Payer: BLUE CROSS/BLUE SHIELD | Attending: Physical Medicine & Rehabilitation | Admitting: Physical Medicine & Rehabilitation

## 2015-11-13 ENCOUNTER — Encounter: Payer: Self-pay | Admitting: Physical Medicine & Rehabilitation

## 2015-11-13 VITALS — BP 139/93 | HR 74 | Resp 16

## 2015-11-13 DIAGNOSIS — G8389 Other specified paralytic syndromes: Secondary | ICD-10-CM | POA: Diagnosis not present

## 2015-11-13 DIAGNOSIS — D7582 Heparin induced thrombocytopenia (HIT): Secondary | ICD-10-CM | POA: Diagnosis not present

## 2015-11-13 DIAGNOSIS — N319 Neuromuscular dysfunction of bladder, unspecified: Secondary | ICD-10-CM | POA: Diagnosis not present

## 2015-11-13 DIAGNOSIS — R252 Cramp and spasm: Secondary | ICD-10-CM | POA: Diagnosis not present

## 2015-11-13 DIAGNOSIS — T45515A Adverse effect of anticoagulants, initial encounter: Secondary | ICD-10-CM | POA: Insufficient documentation

## 2015-11-13 DIAGNOSIS — C799 Secondary malignant neoplasm of unspecified site: Secondary | ICD-10-CM | POA: Diagnosis not present

## 2015-11-13 DIAGNOSIS — C73 Malignant neoplasm of thyroid gland: Secondary | ICD-10-CM | POA: Insufficient documentation

## 2015-11-13 DIAGNOSIS — M5030 Other cervical disc degeneration, unspecified cervical region: Secondary | ICD-10-CM | POA: Insufficient documentation

## 2015-11-13 DIAGNOSIS — M8448XA Pathological fracture, other site, initial encounter for fracture: Secondary | ICD-10-CM | POA: Diagnosis not present

## 2015-11-13 DIAGNOSIS — G8220 Paraplegia, unspecified: Secondary | ICD-10-CM | POA: Insufficient documentation

## 2015-11-13 DIAGNOSIS — I10 Essential (primary) hypertension: Secondary | ICD-10-CM | POA: Insufficient documentation

## 2015-11-13 DIAGNOSIS — Z5189 Encounter for other specified aftercare: Secondary | ICD-10-CM | POA: Insufficient documentation

## 2015-11-13 DIAGNOSIS — R609 Edema, unspecified: Secondary | ICD-10-CM | POA: Diagnosis not present

## 2015-11-13 DIAGNOSIS — G8929 Other chronic pain: Secondary | ICD-10-CM | POA: Insufficient documentation

## 2015-11-13 DIAGNOSIS — C7951 Secondary malignant neoplasm of bone: Secondary | ICD-10-CM

## 2015-11-13 DIAGNOSIS — E669 Obesity, unspecified: Secondary | ICD-10-CM | POA: Insufficient documentation

## 2015-11-13 DIAGNOSIS — Z86711 Personal history of pulmonary embolism: Secondary | ICD-10-CM | POA: Insufficient documentation

## 2015-11-13 DIAGNOSIS — IMO0002 Reserved for concepts with insufficient information to code with codable children: Secondary | ICD-10-CM

## 2015-11-13 MED ORDER — TIZANIDINE HCL 2 MG PO TABS: 2.0000 mg | ORAL_TABLET | Freq: Three times a day (TID) | ORAL | 3 refills | Status: DC

## 2015-11-13 NOTE — Patient Instructions (Signed)
PLEASE CALL ME WITH ANY PROBLEMS OR QUESTIONS ([phone])  

## 2015-11-13 NOTE — Progress Notes (Signed)
Subjective:    Patient ID: Sarah [name], female    DOB: 1965-07-26, 50 y.o.   MRN: [id number]  HPI   Hser is here in follow up o fher spastic paraplegia. She has experienced ongoing functional gains at home. She is able to transfer easier and she is able to perform some hygiene tasks while standing at the sink. She developed some pain in her shoulder and saw orthopedic surgery where she was diagnosed with bursitis. She received 2 injections which helped. Her pain remains under control.   She has noticed that her legs are stiffer. She did not resume the zanaflex after she ran out, but she continues on baclofen. She did not tolerate the gabapentin rx'ed by NS.   Pain Inventory Average Pain 3 Pain Right Now 3 My pain is tingling and aching  In the last 24 hours, has pain interfered with the following? General activity 0 Relation with others 0 Enjoyment of life 0 What TIME of day is your pain at its worst? na Sleep (in general) Good  Pain is worse with: na Pain improves with: na Relief from Meds: na  Mobility use a wheelchair needs help with transfers  Function disabled: date disabled . I need assistance with the following:  dressing, bathing and toileting  Neuro/Psych numbness tingling spasms  Prior Studies Any changes since last visit?  no  Physicians involved in your care Any changes since last visit?  no   Family History  Problem Relation Age of Onset  . Hypertension Mother   . Diabetes Mother   . Hypertension Father   . Stroke Father    Social History   Social History  . Marital status: Single    Spouse name: N/A  . Number of children: N/A  . Years of education: N/A   Social History Main Topics  . Smoking status: Never Smoker  . Smokeless tobacco: Never Used  . Alcohol use No  . Drug use: No  . Sexual activity: Not Currently   Other Topics Concern  . None   Social History Narrative  . None   Past Surgical History:  Procedure Laterality  Date  . LAMINECTOMY N/A 12/14/2013   Procedure: THORACIC LAMINECTOMY FOR TUMOR THORACIC THREE;  Surgeon: Ashok Pall, MD;  Location: [place] [facility];  Service: Neurosurgery;  Laterality: N/A;  THORACIC LAMINECTOMY FOR TUMOR THORACIC THREE  . LAMINECTOMY N/A 07/05/2015   Procedure: THORACIC LAMINECTOMY FOR TUMOR;  Surgeon: Ashok Pall, MD;  Location: [place] [facility]UR[facility] ORS;  Service: Neurosurgery;  Laterality: N/A;  . POSTERIOR LUMBAR FUSION 4 LEVEL N/A 12/30/2013   Procedure: Thoracic one-Thoracic five posterior thoracic fusion with pedicle screws;  Surgeon: Ashok Pall, MD;  Location: [place] NEURO ORS;  Service: Neurosurgery;  Laterality: N/A;  Thoracic one-Thoracic five posterior thoracic fusion with pedicle screws  . THYROIDECTOMY N/A 01/09/2014   Procedure: TOTAL THYROIDECTOMY;  Surgeon: Izora Gala, MD;  Location: [place];  Service: ENT;  Laterality: N/A;  . TONSILLECTOMY     Past Medical History:  Diagnosis Date  . Cancer [facility])    FNA of thyroid positive for onconytic/hurthle cell carcinoma  . Chronic back pain   . DDD (degenerative disc disease), cervical   . DJD (degenerative joint disease)   . History of rectal fissure   . HIT (heparin-induced thrombocytopenia) ([place])   . Hypertension   . Obesity   . Pulmonary embolus, right ([place]) 2015  . Rotator cuff tendonitis right   BP (!) 139/93 (BP Location: Right Arm,  Patient Position: Sitting, Cuff Size: Large)   Pulse 74   Resp 16   LMP 09/05/2015 (Approximate)   SpO2 96%   Opioid Risk Score:   Fall Risk Score:  `1  Depression screen PHQ 2/9  Depression screen [facility] 2/9 11/13/2015 09/04/2015 12/26/2014 09/19/2014 04/20/2014 03/03/2014  Decreased Interest 0 0 0 0 0 0  Down, Depressed, Hopeless 0 0 1 1 0 1  PHQ - 2 Score 0 0 1 1 0 1  Altered sleeping - 0 - 0 - -  Tired, decreased energy - 0 - 1 - -  Change in appetite - 0 - 0 - -  Feeling bad or failure about yourself  - 0 - 0 - -  Trouble concentrating - 0 - 0 - -  Moving slowly or fidgety/restless  - 0 - 0 - -  Suicidal thoughts - 0 - 0 - -  PHQ-9 Score - 0 - 2 - -  Difficult doing work/chores - Not difficult at all - - - -  Some recent data might be hidden     Review of Systems  Musculoskeletal:       Leg stiffness  All other systems reviewed and are negative.      Objective:   Physical Exam Constitutional: She appears well-developed. NAD. Obese  HENT: Normocephalic and atraumatic.  Eyes: Conjunctivae and EOM are normal.  Cardiovascular: Normal rate and regular rhythm.No murmur heard. Respiratory: Effort normal. No stridor. She has no wheezes.  GI: Soft. Bowel sounds are normal. She exhibits no distension. There is no tenderness.  Musculoskeletal: She exhibits no edema or tenderness.  Neurological: She is alert and oriented.  Flat but speech clear and able to follow commands without difficulty.  BUE 5/5 proximal to distal.  RLE: hip flexion, knee extension 3-/5, ankle dorsi/plantar flexion 4/5 LLE: 2/5 proximal to distal  Extensor tone in lower extremities 1-2/4 Skin: Skin is warm and dry.  Upper thoracic incision healed. Psychiatric: Her speech is normal and behavior is normal. Judgment and thought content normal. Cognition and memory are normal.       Assessment & Plan:  1. 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 H/o PE/Anticoagulation: Pharmaceutical: Xarelto,  3. Pain Management: Controlled currently on oxycodone prn. -  4. Metastatic cancer with T3 pathologic FX with cord compression and new osseous mets T12- L3/S1:  -per onc 5. Spasticity: increasing tone on exam  - Continue baclofen 20 mg 3 X day with valium prn.    - zanaflex resume at '2mg'$  tid   6. Neurogenic bowel : changed  to pm bowel program per pt preference -colace in am   16. Neurogenic bladder: emptying bladder  Thirty minutes of face to face patient care time were spent during this visit. All questions were encouraged and answered.  Follow up in about 2 months

## 2015-11-19 ENCOUNTER — Ambulatory Visit (HOSPITAL_BASED_OUTPATIENT_CLINIC_OR_DEPARTMENT_OTHER): Payer: BLUE CROSS/BLUE SHIELD | Admitting: Hematology

## 2015-11-19 ENCOUNTER — Other Ambulatory Visit: Payer: BLUE CROSS/BLUE SHIELD

## 2015-11-19 ENCOUNTER — Telehealth: Payer: Self-pay | Admitting: Hematology

## 2015-11-19 ENCOUNTER — Other Ambulatory Visit (HOSPITAL_BASED_OUTPATIENT_CLINIC_OR_DEPARTMENT_OTHER): Payer: BLUE CROSS/BLUE SHIELD

## 2015-11-19 ENCOUNTER — Encounter: Payer: Self-pay | Admitting: Hematology

## 2015-11-19 VITALS — BP 143/88 | HR 78 | Temp 98.2°F | Resp 19

## 2015-11-19 DIAGNOSIS — C7951 Secondary malignant neoplasm of bone: Secondary | ICD-10-CM

## 2015-11-19 DIAGNOSIS — Z86711 Personal history of pulmonary embolism: Secondary | ICD-10-CM | POA: Diagnosis not present

## 2015-11-19 DIAGNOSIS — C73 Malignant neoplasm of thyroid gland: Secondary | ICD-10-CM

## 2015-11-19 DIAGNOSIS — K1230 Oral mucositis (ulcerative), unspecified: Secondary | ICD-10-CM

## 2015-11-19 LAB — CBC & DIFF AND RETIC
BASO%: 0.4 % (ref 0.0–2.0)
Basophils Absolute: 0 10*3/uL (ref 0.0–0.1)
EOS ABS: 0.1 10*3/uL (ref 0.0–0.5)
EOS%: 2.6 % (ref 0.0–7.0)
HCT: 41.1 % (ref 34.8–46.6)
HEMOGLOBIN: 13.7 g/dL (ref 11.6–15.9)
IMMATURE RETIC FRACT: 7.5 % (ref 1.60–10.00)
LYMPH%: 37.7 % (ref 14.0–49.7)
MCH: 32.1 pg (ref 25.1–34.0)
MCHC: 33.3 g/dL (ref 31.5–36.0)
MCV: 96.3 fL (ref 79.5–101.0)
MONO#: 0.2 10*3/uL (ref 0.1–0.9)
MONO%: 6.7 % (ref 0.0–14.0)
NEUT#: 1.4 10*3/uL — ABNORMAL LOW (ref 1.5–6.5)
NEUT%: 52.6 % (ref 38.4–76.8)
PLATELETS: 146 10*3/uL (ref 145–400)
RBC: 4.27 10*6/uL (ref 3.70–5.45)
RDW: 15.4 % — ABNORMAL HIGH (ref 11.2–14.5)
Retic %: 1.07 % (ref 0.70–2.10)
Retic Ct Abs: 45.69 10*3/uL (ref 33.70–90.70)
WBC: 2.7 10*3/uL — AB (ref 3.9–10.3)
lymph#: 1 10*3/uL (ref 0.9–3.3)

## 2015-11-19 LAB — COMPREHENSIVE METABOLIC PANEL
ALBUMIN: 3 g/dL — AB (ref 3.5–5.0)
ALK PHOS: 239 U/L — AB (ref 40–150)
ALT: 82 U/L — ABNORMAL HIGH (ref 0–55)
ANION GAP: 10 meq/L (ref 3–11)
AST: 46 U/L — ABNORMAL HIGH (ref 5–34)
BILIRUBIN TOTAL: 1.28 mg/dL — AB (ref 0.20–1.20)
BUN: 8.2 mg/dL (ref 7.0–26.0)
CO2: 29 mEq/L (ref 22–29)
Calcium: 9.3 mg/dL (ref 8.4–10.4)
Chloride: 106 mEq/L (ref 98–109)
Creatinine: 0.9 mg/dL (ref 0.6–1.1)
EGFR: 72 mL/min/{1.73_m2} — AB (ref >90)
Glucose: 99 mg/dl (ref 70–140)
POTASSIUM: 4.1 meq/L (ref 3.5–5.1)
Sodium: 145 mEq/L (ref 136–145)
TOTAL PROTEIN: 6.4 g/dL (ref 6.4–8.3)

## 2015-11-19 LAB — TSH

## 2015-11-19 NOTE — Telephone Encounter (Signed)
GAVE PATIENT AVS REPORT AND A[PPOINTMENTS FOR October. CONFIRMED WITH PATIENT SHE ALREADY HAS SCHEDULED APPOINTMENTS WITH DR Zayante Chalmers Cater

## 2015-11-20 LAB — THYROGLOBULIN ANTIBODY: THYROGLOBULIN ANTIBODY: 2.7 [IU]/mL — AB (ref 0.0–0.9)

## 2015-12-11 ENCOUNTER — Inpatient Hospital Stay: Admission: RE | Admit: 2015-12-11 | Payer: BLUE CROSS/BLUE SHIELD | Source: Ambulatory Visit

## 2015-12-14 ENCOUNTER — Ambulatory Visit (HOSPITAL_COMMUNITY): Payer: BLUE CROSS/BLUE SHIELD

## 2015-12-17 NOTE — Progress Notes (Signed)
[name] Kitchen    HEMATOLOGY/ONCOLOGY CLINIC NOTE  Date of Service: .11/19/2015  Patient Care Team: Elberta Leatherwood, MD as PCP - General (Family Medicine)   Dayton Bailiff MD (Spine Surgery - [place]) [phone]. 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  01/09/2014- patient underwent total thyroidectomy by Dr. Elie Goody. Pathology showed Hurthle cell carcinoma with no perineural invasion, presence of lymphovascular invasion and extra thyroidal extension. p T3, p N0, Mx  Received 30 grays in 10 fractions to her C7-T6 lesion finished in November 2015  Received 135 mCi of iodine 131 on 07/21/2014. Whole-body scan was apparently negative.  07/05/2015-07/10/2015 admitted to [facility] for leg weakness. MRI of the T-spine showed recurrent tumor causing spinal cord compression. Patient was emergently taken to the or and underwent a redo thoracic laminectomy of T3 and partial tumor resection.   08/07/2015 PET/CT scan of the chest abdomen pelvis showed slight enlargement of FDG avid left level III and level IV lymph nodes concerning for worsening nodal metastases. Slight enlargement of the hypermetabolic right superior mediastinal mass and multiple metabolically active pulmonary nodules representing worsening metastases. T3 vertebral body and bilateral pedicle metastases status post posture metallic fusion. No residual uptake in the thyroidectomy bed  Thyroglobulin level was 11.7 (thyroglobulin antibody present 9.8)   HISTORY OF PRESENTING ILLNESS:   Sarah [name] is a wonderful 50 y.o. female who has been referred to [place] by Dr Leamon Arnt  (medical oncology at [facility]) for help with local comanagement of the patient's metastatic Hurthle cell thyroid carcinoma with details as noted above. Patient had recent hospitalization from 4/13/7 to 07/10/2015 at [facility] for lower extremity weakness and was noted to have cord compression from recurrent tumor at T3. She is status post redo thoracic laminectomy of T3 and partial tumor resection to try to decompress the core. Pathology was consistent with metastatic Hurthle cell carcinoma.  She has been on dexamethasone 2 mg by mouth twice a day. Continue to have lower extremity weakness and is currently in a skilled nursing facility getting rehabilitation. She feels her lower extremity weakness is possibly getting somewhat better.  Patient had a repeat PET/CT scan at [facility] on 08/07/2015 as noted above which shows progression of her metastatic disease including nodal metastases , pulmonary nodules and right superior mediastinal mass.  MRI of the spine on 08/14/2015 showed improved but persistent cord compression at T3 level.  She was started by Dr. Leamon Arnt on Lenvatinib and 14 mg by mouth daily for progressive metastatic disease after ruling out the option of repeat surgery with Dr. Cyndy Freeze and reradiation with Dr. Ledon Snare.  After a week or so the patient was increased to a dose of '18mg'$  po daily. She notes no diarrhea or other gastrointestinal symptoms. No skin rashes.  Is noted to have oral thrush and was started on nystatin swish and swallow.   Patient has a history of pulmonary embolism 2015 and has continued to be on Xarelto for anticoagulation.   INTERVAL HISTORY  [name] here for her scheduled followup for her metastatic Hurthle cell carcinoma. She reports that she has been compliant with  her Lenvatinib @ '18mg'$  by mouth daily.  No overt nausea vomiting or diarrhea. She has had some grade 1-2 mucositis which limits any further dose escalation. Patient had  follow-up PET/CT scan done on 11/01/2015 which showed overall, the burden of metastatic disease is very similar to the prior study, as detailed above, including left cervical lymphadenopathy, possible tiny recurrence in the thyroidectomy bed, mediastinal lymphadenopathy, bilateral pulmonary nodules, and bony lesions. There was also some incidental node of masslike thickening and hypermetabolic as above the distal rectum and anus which could be chronic inflammatory cannot rule out neoplastic etiology. PET/CT results were discussed in detail with the patient. She notes some change in strength of her lower extremities but no significant change in her back pain. Breathing is stable.    MEDICAL HISTORY:  Past Medical History:  Diagnosis Date  . Cancer [facility])    FNA of thyroid positive for onconytic/hurthle cell carcinoma  . Chronic back pain   . DDD (degenerative disc disease), cervical   . DJD (degenerative joint disease)   . History of rectal fissure   . HIT (heparin-induced thrombocytopenia) ([place])   . Hypertension   . Obesity   . Pulmonary embolus, right ([place]) 2015  . Rotator cuff tendonitis right    SURGICAL HISTORY: Past Surgical History:  Procedure Laterality Date  . LAMINECTOMY N/A 12/14/2013   Procedure: THORACIC LAMINECTOMY FOR TUMOR THORACIC THREE;  Surgeon: Ashok Pall, MD;  Location: [place] NEURO ORS;  Service: Neurosurgery;  Laterality: N/A;  THORACIC LAMINECTOMY FOR TUMOR THORACIC THREE  . LAMINECTOMY N/A 07/05/2015   Procedure: THORACIC LAMINECTOMY FOR TUMOR;  Surgeon: Ashok Pall, MD;  Location: [place] NEURO ORS;  Service: Neurosurgery;  Laterality: N/A;  . POSTERIOR LUMBAR FUSION 4 LEVEL N/A 12/30/2013   Procedure: Thoracic one-Thoracic five posterior thoracic fusion with pedicle screws;  Surgeon: Ashok Pall, MD;  Location: [place] NEURO ORS;  Service: Neurosurgery;  Laterality: N/A;  Thoracic one-Thoracic five posterior thoracic fusion with pedicle screws  . THYROIDECTOMY N/A 01/09/2014    Procedure: TOTAL THYROIDECTOMY;  Surgeon: Izora Gala, MD;  Location: [place];  Service: ENT;  Laterality: N/A;  . TONSILLECTOMY      SOCIAL HISTORY: Social History   Social History  . Marital status: Single    Spouse name: N/A  . Number of children: N/A  . Years of education: N/A   Occupational History  . Not on file.   Social History Main Topics  . Smoking status: Never Smoker  . Smokeless tobacco: Never Used  . Alcohol use No  . Drug use: No  . Sexual activity: Not Currently   Other Topics Concern  . Not on file   Social History Narrative  . No narrative on file    FAMILY HISTORY: Family History  Problem Relation Age of Onset  . Hypertension Mother   . Diabetes Mother   . Hypertension Father   . Stroke Father     ALLERGIES:  is allergic to bee venom; keflex [cephalexin]; tramadol; heparin; hydrocodone; and oxycodone-acetaminophen.  MEDICATIONS:  Current Outpatient Prescriptions  Medication Sig Dispense Refill  . baclofen (LIORESAL) 20 MG tablet TAKE  (1)  TABLET  THREE TIMES DAILY. 90 tablet 2  . bisacodyl (DULCOLAX) 10 MG suppository Daily in evening after supper 30 suppository 0  . lenvatinib 18 mg daily dose (LENVIMA) 10 & 4 (2) MG capsule     . levothyroxine (SYNTHROID, LEVOTHROID) 200 MCG tablet Take 200 mcg by mouth daily before breakfast.    . nystatin (  MYCOSTATIN) 100000 UNIT/ML suspension Take 5 mLs (500,000 Units total) by mouth 4 (four) times daily. 200 mL 0  . ondansetron (ZOFRAN) 4 MG tablet Take 1 tablet (4 mg total) by mouth every 6 (six) hours. 30 tablet 0  . pantoprazole (PROTONIX) 40 MG tablet TAKE 1 TABLET ONCE A DAY 30 tablet 0  . rivaroxaban (XARELTO) 20 MG TABS tablet Take 1 tablet (20 mg total) by mouth daily. 60 tablet 4  . senna (SENOKOT) 8.6 MG TABS tablet Take 2 tablets (17.2 mg total) by mouth daily. 120 each 0  . sertraline (ZOLOFT) 25 MG tablet Take 1 tablet (25 mg total) by mouth daily. 30 tablet 5  . tiZANidine (ZANAFLEX) 2 MG  tablet Take 1 tablet (2 mg total) by mouth 3 (three) times daily. 90 tablet 3  . zinc oxide (BALMEX) 11.3 % CREA cream Apply 1 application topically daily as needed. Apply to rectum     No current facility-administered medications for this visit.     REVIEW OF SYSTEMS:    10 Point review of Systems was done is negative except as noted above.  PHYSICAL EXAMINATION: ECOG PERFORMANCE STATUS: 3 - Symptomatic, >50% confined to bed  . Vitals:   11/19/15 1054  BP: (!) 143/88  Pulse: 78  Resp: 19  Temp: 98.2 F (36.8 C)   Filed Weights   .There is no height or weight on file to calculate BMI.  GENERAL:alert, in no acute distress and comfortable, sitting in the wheelchair  SKIN: skin color, texture, turgor are normal, no rashes or significant lesions EYES: normal, conjunctiva are pink and non-injected, sclera clear OROPHARYNX: grade 1-2 oral mucositis no overt thrush noted NECK: supple, no JVD, thyroid normal size, non-tender, without nodularity LYMPH:  no palpable lymphadenopathy in the cervical, axillary or inguinal LUNGS: clear to auscultation with normal respiratory effort HEART: regular rate & rhythm,  no murmurs and no lower extremity edema ABDOMEN: abdomen soft, non-tender, normoactive bowel sounds  Musculoskeletal: no cyanosis of digits and no clubbing , bilateral chronic 1-2+ pitting pedal edema PSYCH: alert & oriented x 3 with fluent speech NEURO: Decreased bilateral lower extremity strength    LABORATORY DATA:  I have reviewed the data as listed  . CBC Latest Ref Rng & Units 11/19/2015 10/08/2015 08/28/2015  WBC 3.9 - 10.3 10e3/uL 2.7(L) 2.8(L) 3.5(L)  Hemoglobin 11.6 - 15.9 g/dL 13.7 12.2 12.9  Hematocrit 34.8 - 46.6 % 41.1 35.3 37.0  Platelets 145 - 400 10e3/uL 146 213 102(L)    . CMP Latest Ref Rng & Units 11/19/2015 10/08/2015 08/28/2015  Glucose 70 - 140 mg/dl 99 86 95  BUN 7.0 - 26.0 mg/dL 8.2 5.6(L) 11.7  Creatinine 0.6 - 1.1 mg/dL 0.9 0.9 1.1  Sodium 136 -  145 mEq/L 145 142 143  Potassium 3.5 - 5.1 mEq/L 4.1 3.6 3.7  Chloride 101 - 111 mmol/L - - -  CO2 22 - 29 mEq/L '29 26 27  '$ Calcium 8.4 - 10.4 mg/dL 9.3 8.5 8.6  Total Protein 6.4 - 8.3 g/dL 6.4 6.3(L) 6.1(L)  Total Bilirubin 0.20 - 1.20 mg/dL 1.28(H) 1.37(H) 1.74(H)  Alkaline Phos 40 - 150 U/L 239(H) 96 110  AST 5 - 34 U/L 46(H) 13 33  ALT 0 - 55 U/L 82(H) <9 47        RADIOGRAPHIC STUDIES: I have personally reviewed the radiological images as listed and agreed with the findings in the report.  Study Result   CLINICAL DATA:  Subsequent treatment strategy for  metastatic Hurthle cell carcinoma.  EXAM: NUCLEAR MEDICINE PET SKULL BASE TO THIGH  TECHNIQUE: Twelve point to mCi F-18 FDG was injected intravenously. Full-ring PET imaging was performed from the skull base to thigh after the radiotracer. CT data was obtained and used for attenuation correction and anatomic localization.  FASTING BLOOD GLUCOSE:  Value: 108 Mg/dl  COMPARISON:  PET-CT 12/22/2014.  FINDINGS: NECK  There multiple small nonenlarged but intensely hypermetabolic left-sided cervical lymph nodes, the largest of which measures up to 8 mm (SUVmax = 85.9) on image 41 of series 4. Postoperative changes of thyroidectomy. In the right side of the thyroidectomy bed there is a tiny 4 mm soft tissue nodule (image 45 of series 4) which is hypermetabolic (SUVmax = 93.7).  CHEST  There are several very small sub cm pulmonary nodules scattered throughout the lungs bilaterally (left greater than right) which are intensely hypermetabolic. One example of this is a 6 mm left upper lobe pulmonary nodule on image 17 of series 8 (SUVmax = 12.5). Multiple nonenlarged but intensely hypermetabolic mediastinal lymph nodes are noted, largest of which is 9 mm in short axis in the prevascular nodal station adjacent to the superior vena cava and azygos vein on image 58 of series 4 (SUVmax = 74.2). Areas  of hypermetabolism are also noted in the proximal esophagus and distal esophagus at or near the gastroesophageal junction (SUVmax = 6.4 and 7.8 respectively). Heart size is normal. There is no significant pericardial fluid, thickening or pericardial calcification.  ABDOMEN/PELVIS  No abnormal hypermetabolic activity within the liver, pancreas, adrenal glands, or spleen. No hypermetabolic lymph nodes in the abdomen or pelvis. There appears to be pelvic floor laxity, and there is evidence of potential rectal prolapse. Mass-like soft tissue thickening of the distal rectum and anus which is hypermetabolic (SUVmax = 16.9), which could be neoplastic, or could be chronic and inflammatory. No significant volume of ascites. No pneumoperitoneum. No pathologic dilatation of small bowel or colon.  SKELETON  Intense hypermetabolism in the upper thoracic spine at the level of T3 (SUVmax = 993.5)at site of known metastatic lesion status post posterior fixation. There is also a small intensely hypermetabolic lesion in the right side of the manubrium on image 61 of series 4 (SUVmax = 9.9).  IMPRESSION: 1. Overall, the burden of metastatic disease is very similar to the prior study, as detailed above, including left cervical lymphadenopathy, possible tiny recurrence in the thyroidectomy bed, mediastinal lymphadenopathy, bilateral pulmonary nodules, and bony lesions, as detailed above. 2. Masslike thickening and hypermetabolism of the distal rectum and anus, which may be chronic and inflammatory if there is rectal prolapse, or could be neoplastic. Correlation with patient history and physical examination is recommended. 3. Additional incidental findings, as above.   Electronically Signed   By: Vinnie Langton M.D.   On: 11/01/2015 18:33    ASSESSMENT & PLAN:   49 year old female with   1) stage IVc metastatic Hurthle cell thyroid carcinoma with metastases to the spine, lung,  superior mediastinal mass and nodal metastases. Patient has had treatment as noted above. Recently had cord compression at T3 level due to recurrent tumor status post partial resection and debulking. Recent MRI on 08/14/2015 showed improved but persistent cord compression at the T3 level. She has been noted not to be a candidate for further repeat surgery at this time or repeat radiation. Recent PET/CT scan is noted of bowel done at [facility] on 08/07/2015 showed evidence of tumor progression and patient was started on lenvatinib  as per Dr. Leamon Arnt. Her thyroglobulin level was 11.7 with anti-body of 9.8 on 08/07/2015 at [facility] Component     Latest Ref Rng & Units 10/08/2015  THYROGLOBULIN (TG-RIA)     ng/mL 3.0  Thyroglobulin Antibody     0.0 - 0.9 IU/mL 5.2 (H)    2) grade 2 oral mucositis related to Lenvatinib. 3) FDG avidity in the rectum - his was discussed with the patient she reports constipation and feels that this is probably related to her hard.  4) abnormal LFTs with borderline elevation of transaminases and increase in alkaline phosphatase. Mild transaminitis could be from her TKI. Head CT did not show any liver involvement or biliary obstruction. Elevation in alkaline phosphatase could be from additional bone involvement. Plan -patient blood counts and chemistry appeared to be stable. -thyroglobulin level and thyroglobulin antibody levels were somewhat improved in July 2017 she'll be repeated on her next visit. -PET/CT scan shows relatively stable though high burden of disease. Some concern of local recurrence in the thyroidectomy bed which is asymptomatic at this time. -Her TSH appears well suppressed- she will be following with Dr. Chalmers Cater from endocrinology for this. -She has relatively tolerable grade 1-2 oral mucositis and therefore we shall not advance the dose of her Lenvatinib beyound '18mg'$  po daily at this time. -she gets relief with Cepacol as needed and is able to eat fairly normally  other than the fact that she has to avoid very salty or spicy foods. -Recommended she use salt and baking soda mouthwashes 4 times a day ( given the recipe for this) -continue rehabilitation at her skilled nursing facility -MRI T-spine in the 1-2 weeks. Follow-up with Dr. Cyndy Freeze as per her scheduled appointment in September. F/u with Dr Chalmers Cater as per your scheduled appointment. We shall contact with Dr. Leamon Arnt to see if there might be any other treatment options if she has frank symptomatically progression  RTC with [name] in 6 weeks with labs and MRI T spine  2) oral thrush likely related to her steroids - now resolved. [name] now off steroids.  3) history of right-sided pulmonary embolism on chronic anticoagulation with Rivaroxaban. Monitor for bleeding.  Return to care with [name] in 6 weeks with CBC, CMP, thyroglobulin, thyroglobulin antibody and MRI t-spine  All of the patients questions were answered with apparent satisfaction. The patient knows to call the clinic with any problems, questions or concerns.  I spent 30 minutes counseling the patient face to face. The total time spent in the appointment was 40 minutes and more than 50% was on counseling and direct patient cares.    Sullivan Lone MD [place] AAHIVMS [facility] Hematology/Oncology [facility]  (Office):       [phone] (Work cell):  [phone] (Fax):           [phone]

## 2015-12-18 ENCOUNTER — Ambulatory Visit: Payer: BLUE CROSS/BLUE SHIELD | Attending: Physical Medicine & Rehabilitation | Admitting: Physical Therapy

## 2015-12-18 DIAGNOSIS — M6281 Muscle weakness (generalized): Secondary | ICD-10-CM | POA: Insufficient documentation

## 2015-12-18 DIAGNOSIS — R2689 Other abnormalities of gait and mobility: Secondary | ICD-10-CM | POA: Diagnosis present

## 2015-12-18 DIAGNOSIS — G8222 Paraplegia, incomplete: Secondary | ICD-10-CM | POA: Insufficient documentation

## 2015-12-18 NOTE — Therapy (Signed)
[facility] [address] [place], [place], [address] Phone: [phone]   Fax:  [phone]  Physical Therapy Evaluation  Patient Details  Name: Sarah [name] MRN: [id number] Date of Birth: Dec 02, 1965 Referring Provider: Dr. Alger Simons  Encounter Date: 12/18/2015      PT End of Session - 12/20/15 1154    Visit Number 1  11 visits used in previous admission   Number of Visits 9   Date for PT Re-Evaluation 01/17/16   Authorization Type [employer]   Authorization Time Period 03-25-15 - 03-23-16   Authorization - Visit Number 12   Authorization - Number of Visits 30   PT Start Time 1150   PT Stop Time 1232   PT Time Calculation (min) 42 min      Past Medical History:  Diagnosis Date  . 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 Chronic back pain   . DDD (degenerative disc disease), cervical   . DJD (degenerative joint disease)   . History of rectal fissure   . HIT (heparin-induced thrombocytopenia) ([place])   . Hypertension   . Obesity   . Pulmonary embolus, right ([place]) 2015  . Rotator cuff tendonitis right    Past Surgical History:  Procedure Laterality Date  . LAMINECTOMY N/A 12/14/2013   Procedure: THORACIC LAMINECTOMY FOR TUMOR THORACIC THREE;  Surgeon: Ashok Pall, MD;  Location: [facility];  Service: Neurosurgery;  Laterality: N/A;  THORACIC LAMINECTOMY FOR TUMOR THORACIC THREE  . LAMINECTOMY N/A 07/05/2015   Procedure: THORACIC LAMINECTOMY FOR TUMOR;  Surgeon: Ashok Pall, MD;  Location: [facility];  Service: Neurosurgery;  Laterality: N/A;  . POSTERIOR LUMBAR FUSION 4 LEVEL N/A 12/30/2013   Procedure: Thoracic one-Thoracic five posterior thoracic fusion with pedicle screws;  Surgeon: Ashok Pall, MD;  Location: [place];  Service: Neurosurgery;  Laterality: N/A;  Thoracic one-Thoracic five posterior thoracic fusion with pedicle screws  . THYROIDECTOMY N/A  01/09/2014   Procedure: TOTAL THYROIDECTOMY;  Surgeon: Izora Gala, MD;  Location: [facility];  Service: ENT;  Laterality: N/A;  . TONSILLECTOMY      There were no vitals filed for this visit.       Subjective Assessment - 12/20/15 1139    Subjective Pt reports tumor in back grew back - had surgery in April, went to inpatient rehab; had home health PT and OT in May -June; is requesting not to transfer out of wheelchair today   [name] accompained by: Family member   Patient Stated Goals Improve mobility - get legs stronger   Currently in Pain? 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 Alger Simons   Onset Date/Surgical Date 12/12/13   Prior Therapy home health x 1 visit     Precautions   Precautions Fall   Precaution Comments nonambulatory     Balance Screen   Has the patient fallen in the past 6 months No   Has the patient had a decrease in activity level because of a fear of falling?  No   Is the patient reluctant to leave their home because of a fear of falling?  No     Home Environment   Living Environment Private residence   Type of [place] Access  Ramped entrance   Home Layout One level     Prior Function   Level of Independence Independent with basic ADLs;Independent with homemaking with ambulation   Vocation Full time employment     ROM / Strength   AROM / PROM / Strength Strength     PROM   Overall PROM  Within functional limits for tasks performed  No AROM due to paraplegia - T4 spinal cord level   Right/Left Hip Right   Left Hip Flexion 3-/5     Ambulation/Gait   Ambulation/Gait No        L quads 3-  L hip 2+/5  R quads 4/5 R hip flexors 3+   Dorsiflexors 4/5 bil.   Sliding board used with bed transfers, car transfers No board used with [facility] Not standing   Pt doing sit to supine TRANSFERS INDEPENDENTLY      needs UE for sitting unsupported               PT Long Term Goals - 12/20/15 1203      PT LONG TERM GOAL #1   Title Transfer wheelchair to/from mat with SBA without sliding board.  (01-17-16)   Time 4   Period Weeks   Status New     PT LONG TERM GOAL #2   Title Sit on side of mat for at least 10" without UE support modified independently to incr. independence with dressing, grooming,etc. at home.  (01-17-16)   Time 4   Period Weeks   Status New     PT LONG TERM GOAL #3   Title Sit unsupported on side of mat with 1 UE support and reach 4" anteriorly with other UE support with min assist to increase independence with ADL's.  (01-17-16)   Time 4   Period Weeks   Status New     PT LONG TERM GOAL #4   Title Independent in updated HEP  (01-17-16)   Time 4   Period Weeks   Status New     PT LONG TERM GOAL #5   Title Pt will tolerate rolling prone and maintaining position for at least 10 mins for stretching hip flexors.  (01-17-16)   Time 4   Period Weeks   Status New               Plan - 12/20/15 1156    Clinical Impression Statement Pt is a 50 year old female s/p thoracic laminectomy for metastatic tumor at T3 with paraplegia.  Pt was hospitalized from 4-13 until 07-11-15; pt was previously seen at this facility for OP PT from Jan. 2016 until April 2017, at which time she was having increased spasticity and tightness in her lower extremities.  Pt went to ED on 07-05-15 and underwent surgery due to tumor regrowth.  Pt had home health PT and OT in May - June 2017; PMH includes HTN, DDD, DJD, s/p laminectomy for T3 thoracic tumor on 12-14-13 , posterior lumbar fusion 4 levels on 10 -19-15.  Pt presents with LE weakness/paraplegia and dependencies with transfers and mobility.      Rehab Potential Good   PT Frequency 2x / week   PT Duration 4 weeks   PT Treatment/Interventions ADLs/Self Care Home Management;Therapeutic exercise;Balance training;Therapeutic  activities;Functional mobility training;Gait training;Neuromuscular re-education;Patient/family education   PT Next Visit Plan LE stretching/ standing as tolerated; asssess tranfers   Consulted and Agree with Plan of Care Patient      Patient will benefit from skilled therapeutic  intervention in order to improve the following deficits and impairments:  Abnormal gait, Difficulty walking, Decreased balance, Decreased activity tolerance, Impaired tone, Decreased strength  Visit Diagnosis: Paraplegia, incomplete ([place]) - Plan: PT plan of care cert/re-cert  Muscle weakness (generalized) - Plan: PT plan of care cert/re-cert  Other abnormalities of gait and mobility - Plan: PT plan of care cert/re-cert     Problem List Patient Active Problem List   Diagnosis Date Noted  . E. coli UTI 08/01/2015  . Constipation due to neurogenic bowel 08/01/2015  . Muscle spasticity   . 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 Neurogenic bladder   . History of pulmonary embolism   . Post-operative pain   . Metastatic cancer ([place])   . Steroid-induced hyperglycemia   . Benign essential HTN   . Acute blood loss anemia   . Leukocytosis   . Depression   . Obstipation   . Thoracic spine tumor 07/06/2015  . 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 Cord compression ([place])   . Encounter for intubation   . Lower extremity weakness   . Respiratory failure ([place])   . Postoperative hypothyroidism   . Peripheral edema 06/07/2015  . Hypothyroidism, postop 03/08/2015  . Secondary malignant neoplasm of vertebral column ([place]) 03/08/2015  . 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 Dysuria 09/04/2014  . Hypertension 05/01/2014  . Ingrown right big toenail 05/01/2014  . Hypokalemia 05/01/2014  . GERD (gastroesophageal reflux disease) 02/14/2014  . Dysphagia, pharyngoesophageal phase 02/06/2014  . Type 2 diabetes mellitus without complication ([place]) 40/34/7425  .  Constipation   . Neurogenic bowel   . 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 ([place]) 01/20/2014  . Metastatic cancer to spine ([place]) 01/19/2014  . Rotator cuff tendonitis   . Acute postoperative respiratory failure ([place]) 01/09/2014  . 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 Leg weakness, bilateral 12/13/2013    [place], PT 12/20/2015, 12:12 PM  [facility] [address] [place], [place], [address] Phone: [phone]   Fax:  [phone]  Name: KERAH HARDEBECK MRN: [id number] Date of Birth: May 01, 1965

## 2015-12-25 ENCOUNTER — Ambulatory Visit (HOSPITAL_COMMUNITY)
Admission: RE | Admit: 2015-12-25 | Discharge: 2015-12-25 | Disposition: A | Discharge status: Home / Self Care | Payer: BLUE CROSS/BLUE SHIELD | Source: Ambulatory Visit | Attending: Hematology | Admitting: Hematology

## 2015-12-25 DIAGNOSIS — C7951 Secondary malignant neoplasm of bone: Secondary | ICD-10-CM | POA: Insufficient documentation

## 2015-12-25 DIAGNOSIS — M5124 Other intervertebral disc displacement, thoracic region: Secondary | ICD-10-CM | POA: Diagnosis not present

## 2015-12-25 DIAGNOSIS — C73 Malignant neoplasm of thyroid gland: Secondary | ICD-10-CM | POA: Diagnosis not present

## 2015-12-25 DIAGNOSIS — Z981 Arthrodesis status: Secondary | ICD-10-CM | POA: Insufficient documentation

## 2015-12-25 MED ORDER — GADOBENATE DIMEGLUMINE 529 MG/ML IV SOLN
20.0000 mL | Freq: Once | INTRAVENOUS | Status: AC | PRN
  Administered 2015-12-25: 20 mL via INTRAVENOUS

## 2015-12-28 ENCOUNTER — Ambulatory Visit: Payer: BLUE CROSS/BLUE SHIELD | Admitting: Podiatry

## 2015-12-31 ENCOUNTER — Other Ambulatory Visit: Payer: BLUE CROSS/BLUE SHIELD

## 2015-12-31 ENCOUNTER — Ambulatory Visit: Payer: BLUE CROSS/BLUE SHIELD | Admitting: Hematology

## 2016-01-03 ENCOUNTER — Telehealth: Payer: Self-pay | Admitting: Physical Medicine & Rehabilitation

## 2016-01-03 NOTE — Telephone Encounter (Signed)
Patient would like to know if she can Cortizone shots in both shoulders at her next appointment.  Please call patient.

## 2016-01-04 NOTE — Telephone Encounter (Signed)
Yes she may. Please make a note for appt. thanks

## 2016-01-04 NOTE — Telephone Encounter (Signed)
Pt notified, appointment note edited to reflect change

## 2016-01-07 ENCOUNTER — Ambulatory Visit: Payer: BLUE CROSS/BLUE SHIELD | Admitting: Physical Therapy

## 2016-01-07 ENCOUNTER — Other Ambulatory Visit: Payer: Self-pay | Admitting: *Deleted

## 2016-01-07 MED ORDER — PANTOPRAZOLE SODIUM 40 MG PO TBEC: 40.0000 mg | DELAYED_RELEASE_TABLET | Freq: Every day | ORAL | 5 refills | Status: DC

## 2016-01-08 ENCOUNTER — Telehealth: Payer: Self-pay | Admitting: *Deleted

## 2016-01-08 ENCOUNTER — Encounter: Payer: BLUE CROSS/BLUE SHIELD | Admitting: Physical Medicine & Rehabilitation

## 2016-01-08 NOTE — Telephone Encounter (Signed)
I called to clarify Sarah [name] needing her xarelto ordered.  She was mistaken, it was the protonix.  I see that her pcp ordered it yesterday and it was sent to her pharmacy.

## 2016-01-10 ENCOUNTER — Ambulatory Visit: Payer: BLUE CROSS/BLUE SHIELD | Admitting: Physical Therapy

## 2016-01-14 ENCOUNTER — Ambulatory Visit: Payer: BLUE CROSS/BLUE SHIELD | Admitting: Physical Therapy

## 2016-01-15 ENCOUNTER — Other Ambulatory Visit: Payer: Self-pay | Admitting: Physical Medicine & Rehabilitation

## 2016-01-17 ENCOUNTER — Ambulatory Visit: Payer: BLUE CROSS/BLUE SHIELD | Admitting: Physical Therapy

## 2016-01-21 ENCOUNTER — Encounter: Payer: BLUE CROSS/BLUE SHIELD | Admitting: Physical Medicine & Rehabilitation

## 2016-01-22 ENCOUNTER — Ambulatory Visit: Payer: BLUE CROSS/BLUE SHIELD | Admitting: Physical Therapy

## 2016-01-24 ENCOUNTER — Ambulatory Visit: Payer: BLUE CROSS/BLUE SHIELD | Admitting: Physical Therapy

## 2016-01-29 ENCOUNTER — Ambulatory Visit: Payer: BLUE CROSS/BLUE SHIELD | Admitting: Physical Therapy

## 2016-01-31 ENCOUNTER — Other Ambulatory Visit (HOSPITAL_BASED_OUTPATIENT_CLINIC_OR_DEPARTMENT_OTHER): Payer: BLUE CROSS/BLUE SHIELD

## 2016-01-31 ENCOUNTER — Encounter: Payer: Self-pay | Admitting: Hematology

## 2016-01-31 ENCOUNTER — Telehealth: Payer: Self-pay | Admitting: Hematology

## 2016-01-31 ENCOUNTER — Ambulatory Visit (HOSPITAL_BASED_OUTPATIENT_CLINIC_OR_DEPARTMENT_OTHER): Payer: BLUE CROSS/BLUE SHIELD | Admitting: Hematology

## 2016-01-31 VITALS — BP 183/85 | HR 65 | Temp 98.6°F | Resp 19

## 2016-01-31 DIAGNOSIS — C73 Malignant neoplasm of thyroid gland: Secondary | ICD-10-CM | POA: Diagnosis not present

## 2016-01-31 DIAGNOSIS — Z7901 Long term (current) use of anticoagulants: Secondary | ICD-10-CM

## 2016-01-31 DIAGNOSIS — C7800 Secondary malignant neoplasm of unspecified lung: Secondary | ICD-10-CM

## 2016-01-31 DIAGNOSIS — C7951 Secondary malignant neoplasm of bone: Secondary | ICD-10-CM | POA: Diagnosis not present

## 2016-01-31 DIAGNOSIS — I10 Essential (primary) hypertension: Secondary | ICD-10-CM

## 2016-01-31 DIAGNOSIS — B370 Candidal stomatitis: Secondary | ICD-10-CM | POA: Diagnosis not present

## 2016-01-31 DIAGNOSIS — L739 Follicular disorder, unspecified: Secondary | ICD-10-CM

## 2016-01-31 DIAGNOSIS — Z86711 Personal history of pulmonary embolism: Secondary | ICD-10-CM

## 2016-01-31 LAB — CBC & DIFF AND RETIC
BASO%: 0 % (ref 0.0–2.0)
Basophils Absolute: 0 10*3/uL (ref 0.0–0.1)
EOS%: 4.4 % (ref 0.0–7.0)
Eosinophils Absolute: 0.1 10*3/uL (ref 0.0–0.5)
HCT: 37 % (ref 34.8–46.6)
HGB: 12.8 g/dL (ref 11.6–15.9)
Immature Retic Fract: 7.9 % (ref 1.60–10.00)
LYMPH#: 1 10*3/uL (ref 0.9–3.3)
LYMPH%: 29.9 % (ref 14.0–49.7)
MCH: 33.2 pg (ref 25.1–34.0)
MCHC: 34.6 g/dL (ref 31.5–36.0)
MCV: 96.1 fL (ref 79.5–101.0)
MONO#: 0.2 10*3/uL (ref 0.1–0.9)
MONO%: 6.2 % (ref 0.0–14.0)
NEUT%: 59.5 % (ref 38.4–76.8)
NEUTROS ABS: 1.9 10*3/uL (ref 1.5–6.5)
Platelets: 161 10*3/uL (ref 145–400)
RBC: 3.85 10*6/uL (ref 3.70–5.45)
RDW: 14.6 % — AB (ref 11.2–14.5)
RETIC %: 1.7 % (ref 0.70–2.10)
RETIC CT ABS: 65.45 10*3/uL (ref 33.70–90.70)
WBC: 3.2 10*3/uL — AB (ref 3.9–10.3)

## 2016-01-31 LAB — COMPREHENSIVE METABOLIC PANEL
ALT: 9 U/L (ref 0–55)
AST: 22 U/L (ref 5–34)
Albumin: 3.1 g/dL — ABNORMAL LOW (ref 3.5–5.0)
Alkaline Phosphatase: 132 U/L (ref 40–150)
Anion Gap: 9 mEq/L (ref 3–11)
BUN: 11.3 mg/dL (ref 7.0–26.0)
CALCIUM: 8.8 mg/dL (ref 8.4–10.4)
CHLORIDE: 106 meq/L (ref 98–109)
CO2: 29 meq/L (ref 22–29)
Creatinine: 0.9 mg/dL (ref 0.6–1.1)
EGFR: 73 mL/min/{1.73_m2} — ABNORMAL LOW (ref >90)
GLUCOSE: 95 mg/dL (ref 70–140)
POTASSIUM: 4 meq/L (ref 3.5–5.1)
SODIUM: 144 meq/L (ref 136–145)
Total Bilirubin: 0.9 mg/dL (ref 0.20–1.20)
Total Protein: 6.3 g/dL — ABNORMAL LOW (ref 6.4–8.3)

## 2016-01-31 LAB — TSH

## 2016-01-31 MED ORDER — MUPIROCIN 2 % EX OINT: 1.0000 "application " | TOPICAL_OINTMENT | Freq: Two times a day (BID) | CUTANEOUS | 0 refills | Status: DC

## 2016-01-31 MED ORDER — LISINOPRIL 5 MG PO TABS: 5.0000 mg | ORAL_TABLET | Freq: Every day | ORAL | 0 refills | Status: DC

## 2016-01-31 NOTE — Telephone Encounter (Signed)
Appointments scheduled per 01/31/16 los. AVS report and appointment schedule given to patient per 01/31/16 los.

## 2016-01-31 NOTE — Progress Notes (Signed)
[name] Kitchen    HEMATOLOGY/ONCOLOGY CLINIC NOTE  Date of Service: .01/31/2016  Patient Care Team: Elberta Leatherwood, MD as PCP - General (Family Medicine)   Dayton Bailiff MD (Spine Surgery - [place]) [phone]. 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  01/09/2014- patient underwent total thyroidectomy by Dr. Elie Goody. Pathology showed Hurthle cell carcinoma with no perineural invasion, presence of lymphovascular invasion and extra thyroidal extension. p T3, p N0, Mx  Received 30 grays in 10 fractions to her C7-T6 lesion finished in November 2015  Received 135 mCi of iodine 131 on 07/21/2014. Whole-body scan was apparently negative.  07/05/2015-07/10/2015 admitted to [facility] for leg weakness. MRI of the T-spine showed recurrent tumor causing spinal cord compression. Patient was emergently taken to the or and underwent a redo thoracic laminectomy of T3 and partial tumor resection.   08/07/2015 PET/CT scan of the chest abdomen pelvis showed slight enlargement of FDG avid left level III and level IV lymph nodes concerning for worsening nodal metastases. Slight enlargement of the hypermetabolic right superior mediastinal mass and multiple metabolically active pulmonary nodules representing worsening metastases. T3 vertebral body and bilateral pedicle metastases status post posture metallic fusion. No residual uptake in the thyroidectomy bed  Thyroglobulin level was 11.7 (thyroglobulin antibody present 9.8)   HISTORY OF PRESENTING ILLNESS:   Sarah [name] is a wonderful 50 y.o. female who has been referred to [place] by Dr Leamon Arnt  (medical oncology at [facility]) for help with local comanagement of the patient's metastatic Hurthle cell thyroid carcinoma with details as noted above. Patient had recent hospitalization from 4/13/7 to 07/10/2015 at [name] for lower extremity weakness and was noted to have cord compression from recurrent tumor at T3. She is status post redo thoracic laminectomy of T3 and partial tumor resection to try to decompress the core. Pathology was consistent with metastatic Hurthle cell carcinoma.  She has been on dexamethasone 2 mg by mouth twice a day. Continue to have lower extremity weakness and is currently in a skilled nursing facility getting rehabilitation. She feels her lower extremity weakness is possibly getting somewhat better.  Patient had a repeat PET/CT scan at [facility] on 08/07/2015 as noted above which shows progression of her metastatic disease including nodal metastases , pulmonary nodules and right superior mediastinal mass.  MRI of the spine on 08/14/2015 showed improved but persistent cord compression at T3 level.  She was started by Dr. Leamon Arnt on Lenvatinib and 14 mg by mouth daily for progressive metastatic disease after ruling out the option of repeat surgery with Dr. Cyndy Freeze and reradiation with Dr. Ledon Snare.  After a week or so the patient was increased to a dose of '18mg'$  po daily. She notes no diarrhea or other gastrointestinal symptoms. No skin rashes.  Is noted to have oral thrush and was started on nystatin swish and swallow.   Patient has a history of pulmonary embolism 2015 and has continued to be on Xarelto for anticoagulation.   INTERVAL HISTORY  [name] here for her scheduled followup for her metastatic Hurthle cell carcinoma.  She had a follow-up MRI of the  thoracic spine that showed stable/improved disease. She notes that she is able to try to stand a little bit with support which is an improvement. She reports that she has seen by Dr.  Cyndy Freeze and her MRI was reviewed and noted to be better. Oral mucositis better. Has noted some skin sores and reports that she tends to pick at them. Noted to have dry skin. Given prescription for Bactroban. Recommended she Dr. Darl Householder care physician about possible dermatology referral if needed. Her blood pressure has been progressively increasing over the last several doctors visits as per her report and is elevated today and she was restarted back on her lisinopril 5 mg daily and recommended to follow-up with primary care physician. No other new back pain. No other acute new support will symptoms. Following with Dr. Chalmers Cater for optimization of TSH suppression with levothyroxine.  MEDICAL HISTORY:  Past Medical History:  Diagnosis Date  . Cancer [facility])    FNA of thyroid positive for onconytic/hurthle cell carcinoma  . Chronic back pain   . DDD (degenerative disc disease), cervical   . DJD (degenerative joint disease)   . History of rectal fissure   . HIT (heparin-induced thrombocytopenia) ([place])   . Hypertension   . Obesity   . Pulmonary embolus, right ([place]) 2015  . Rotator cuff tendonitis right    SURGICAL HISTORY: Past Surgical History:  Procedure Laterality Date  . LAMINECTOMY N/A 12/14/2013   Procedure: THORACIC LAMINECTOMY FOR TUMOR THORACIC THREE;  Surgeon: Ashok Pall, MD;  Location: [place] NEURO ORS;  Service: Neurosurgery;  Laterality: N/A;  THORACIC LAMINECTOMY FOR TUMOR THORACIC THREE  . LAMINECTOMY N/A 07/05/2015   Procedure: THORACIC LAMINECTOMY FOR TUMOR;  Surgeon: Ashok Pall, MD;  Location: [place] NEURO ORS;  Service: Neurosurgery;  Laterality: N/A;  . POSTERIOR LUMBAR FUSION 4 LEVEL N/A 12/30/2013   Procedure: Thoracic one-Thoracic five posterior thoracic fusion with pedicle screws;  Surgeon: Ashok Pall, MD;  Location: [place] NEURO ORS;  Service: Neurosurgery;  Laterality: N/A;  Thoracic one-Thoracic five posterior thoracic fusion with pedicle screws  . THYROIDECTOMY N/A 01/09/2014     Procedure: TOTAL THYROIDECTOMY;  Surgeon: Izora Gala, MD;  Location: [place];  Service: ENT;  Laterality: N/A;  . TONSILLECTOMY      SOCIAL HISTORY: Social History   Social History  . Marital status: Single    Spouse name: N/A  . Number of children: N/A  . Years of education: N/A   Occupational History  . Not on file.   Social History Main Topics  . Smoking status: Never Smoker  . Smokeless tobacco: Never Used  . Alcohol use No  . Drug use: No  . Sexual activity: Not Currently   Other Topics Concern  . Not on file   Social History Narrative  . No narrative on file    FAMILY HISTORY: Family History  Problem Relation Age of Onset  . Hypertension Mother   . Diabetes Mother   . Hypertension Father   . Stroke Father     ALLERGIES:  is allergic to bee venom; keflex [cephalexin]; tramadol; heparin; hydrocodone; and oxycodone-acetaminophen.  MEDICATIONS:  Current Outpatient Prescriptions  Medication Sig Dispense Refill  . baclofen (LIORESAL) 20 MG tablet TAKE  (1)  TABLET  THREE TIMES DAILY. 90 tablet 0  . bisacodyl (DULCOLAX) 10 MG suppository Daily in evening after supper 30 suppository 0  . lenvatinib 18 mg daily dose (LENVIMA) 10 & 4 (2) MG capsule     . levothyroxine (SYNTHROID, LEVOTHROID) 200 MCG  tablet Take 200 mcg by mouth daily before breakfast.    . nystatin (MYCOSTATIN) 100000 UNIT/ML suspension Take 5 mLs (500,000 Units total) by mouth 4 (four) times daily. 200 mL 0  . ondansetron (ZOFRAN) 4 MG tablet Take 1 tablet (4 mg total) by mouth every 6 (six) hours. 30 tablet 0  . pantoprazole (PROTONIX) 40 MG tablet Take 1 tablet (40 mg total) by mouth daily. 30 tablet 5  . rivaroxaban (XARELTO) 20 MG TABS tablet Take 1 tablet (20 mg total) by mouth daily. 60 tablet 4  . senna (SENOKOT) 8.6 MG TABS tablet Take 2 tablets (17.2 mg total) by mouth daily. 120 each 0  . sertraline (ZOLOFT) 25 MG tablet Take 1 tablet (25 mg total) by mouth daily. 30 tablet 5  . zinc  oxide (BALMEX) 11.3 % CREA cream Apply 1 application topically daily as needed. Apply to rectum    . lisinopril (PRINIVIL,ZESTRIL) 5 MG tablet Take 1 tablet (5 mg total) by mouth daily. Continue followup with PCP for continued management of HTN 30 tablet 0  . mupirocin ointment (BACTROBAN) 2 % Place 1 application into the nose 2 (two) times daily. And on sores on extremities and face 22 g 0   No current facility-administered medications for this visit.     REVIEW OF SYSTEMS:    10 Point review of Systems was done is negative except as noted above.  PHYSICAL EXAMINATION: ECOG PERFORMANCE STATUS: 3 - Symptomatic, >50% confined to bed  . Vitals:   01/31/16 0907  BP: (!) 183/85  Pulse: 65  Resp: 19  Temp: 98.6 F (37 C)   Filed Weights   .There is no height or weight on file to calculate BMI.  GENERAL:alert, in no acute distress and comfortable, sitting in the wheelchair  SKIN: skin color, texture, turgor are normal, no rashes or significant lesions EYES: normal, conjunctiva are pink and non-injected, sclera clear OROPHARYNX: grade 1-2 oral mucositis no overt thrush noted NECK: supple, no JVD, thyroid normal size, non-tender, without nodularity LYMPH:  no palpable lymphadenopathy in the cervical, axillary or inguinal LUNGS: clear to auscultation with normal respiratory effort HEART: regular rate & rhythm,  no murmurs and no lower extremity edema ABDOMEN: abdomen soft, non-tender, normoactive bowel sounds  Musculoskeletal: no cyanosis of digits and no clubbing , bilateral chronic 1-2+ pitting pedal edema PSYCH: alert & oriented x 3 with fluent speech NEURO: Decreased bilateral lower extremity strength    LABORATORY DATA:  I have reviewed the data as listed  . CBC Latest Ref Rng & Units 01/31/2016 11/19/2015 10/08/2015  WBC 3.9 - 10.3 10e3/uL 3.2(L) 2.7(L) 2.8(L)  Hemoglobin 11.6 - 15.9 g/dL 12.8 13.7 12.2  Hematocrit 34.8 - 46.6 % 37.0 41.1 35.3  Platelets 145 - 400 10e3/uL  161 146 213    . CMP Latest Ref Rng & Units 01/31/2016 11/19/2015 10/08/2015  Glucose 70 - 140 mg/dl 95 99 86  BUN 7.0 - 26.0 mg/dL 11.3 8.2 5.6(L)  Creatinine 0.6 - 1.1 mg/dL 0.9 0.9 0.9  Sodium 136 - 145 mEq/L 144 145 142  Potassium 3.5 - 5.1 mEq/L 4.0 4.1 3.6  Chloride 101 - 111 mmol/L - - -  CO2 22 - 29 mEq/L '29 29 26  '$ Calcium 8.4 - 10.4 mg/dL 8.8 9.3 8.5  Total Protein 6.4 - 8.3 g/dL 6.3(L) 6.4 6.3(L)  Total Bilirubin 0.20 - 1.20 mg/dL 0.90 1.28(H) 1.37(H)  Alkaline Phos 40 - 150 U/L 132 239(H) 96  AST 5 - 34 U/L  22 46(H) 13  ALT 0 - 55 U/L 9 82(H) <9        RADIOGRAPHIC STUDIES: I have personally reviewed the radiological images as listed and agreed with the findings in the report.  Study Result   CLINICAL DATA:  Subsequent treatment strategy for metastatic Hurthle cell carcinoma.  EXAM: NUCLEAR MEDICINE PET SKULL BASE TO THIGH  TECHNIQUE: Twelve point to mCi F-18 FDG was injected intravenously. Full-ring PET imaging was performed from the skull base to thigh after the radiotracer. CT data was obtained and used for attenuation correction and anatomic localization.  FASTING BLOOD GLUCOSE:  Value: 108 Mg/dl  COMPARISON:  PET-CT 12/22/2014.  FINDINGS: NECK  There multiple small nonenlarged but intensely hypermetabolic left-sided cervical lymph nodes, the largest of which measures up to 8 mm (SUVmax = 85.9) on image 41 of series 4. Postoperative changes of thyroidectomy. In the right side of the thyroidectomy bed there is a tiny 4 mm soft tissue nodule (image 45 of series 4) which is hypermetabolic (SUVmax = 26.9).  CHEST  There are several very small sub cm pulmonary nodules scattered throughout the lungs bilaterally (left greater than right) which are intensely hypermetabolic. One example of this is a 6 mm left upper lobe pulmonary nodule on image 17 of series 8 (SUVmax = 12.5). Multiple nonenlarged but intensely hypermetabolic mediastinal  lymph nodes are noted, largest of which is 9 mm in short axis in the prevascular nodal station adjacent to the superior vena cava and azygos vein on image 58 of series 4 (SUVmax = 74.2). Areas of hypermetabolism are also noted in the proximal esophagus and distal esophagus at or near the gastroesophageal junction (SUVmax = 6.4 and 7.8 respectively). Heart size is normal. There is no significant pericardial fluid, thickening or pericardial calcification.  ABDOMEN/PELVIS  No abnormal hypermetabolic activity within the liver, pancreas, adrenal glands, or spleen. No hypermetabolic lymph nodes in the abdomen or pelvis. There appears to be pelvic floor laxity, and there is evidence of potential rectal prolapse. Mass-like soft tissue thickening of the distal rectum and anus which is hypermetabolic (SUVmax = 48.5), which could be neoplastic, or could be chronic and inflammatory. No significant volume of ascites. No pneumoperitoneum. No pathologic dilatation of small bowel or colon.  SKELETON  Intense hypermetabolism in the upper thoracic spine at the level of T3 (SUVmax = 993.5)at site of known metastatic lesion status post posterior fixation. There is also a small intensely hypermetabolic lesion in the right side of the manubrium on image 61 of series 4 (SUVmax = 9.9).  IMPRESSION: 1. Overall, the burden of metastatic disease is very similar to the prior study, as detailed above, including left cervical lymphadenopathy, possible tiny recurrence in the thyroidectomy bed, mediastinal lymphadenopathy, bilateral pulmonary nodules, and bony lesions, as detailed above. 2. Masslike thickening and hypermetabolism of the distal rectum and anus, which may be chronic and inflammatory if there is rectal prolapse, or could be neoplastic. Correlation with patient history and physical examination is recommended. 3. Additional incidental findings, as above.   Electronically Signed   By:  Vinnie Langton M.D.   On: 11/01/2015 18:33   MRI thoracic spine 12/25/2015: IMPRESSION: No new abnormality since the prior examination.  Status post T1-5 fusion for T3 decompression. Mass effect on the cord at the T3 level appears improved compared to the prior exam. Postoperative fluid collection likely representing a seroma seen on the prior study has slightly decreased in size.  No change in a T7-8 disc protrusion  which contacts and mildly flattens the ventral cord.   Electronically Signed   By: Inge Rise M.D.   On: 12/25/2015 13:12   ASSESSMENT & PLAN:   50 year old female with   1) stage IVc metastatic Hurthle cell thyroid carcinoma with metastases to the spine, lung, superior mediastinal mass and nodal metastases. Patient has had treatment as noted above. Recently had cord compression at T3 level due to recurrent tumor status post partial resection and debulking. Recent MRI on 08/14/2015 showed improved but persistent cord compression at the T3 level. She has been noted not to be a candidate for further repeat surgery at this time or repeat radiation. Recent PET/CT scan is noted of bowel done at [facility] on 08/07/2015 showed evidence of tumor progression and patient was started on lenvatinib as per Dr. Leamon Arnt. Her thyroglobulin level was 11.7 with anti-body of 9.8 on 08/07/2015 at [facility]     Latest Ref Rng & Units 10/08/2015  THYROGLOBULIN (TG-RIA)     ng/mL 3.0  Thyroglobulin Antibody     0.0 - 0.9 IU/mL 5.2 (H)   PET/CT 11/01/2015: shows relatively stable though high burden of disease. Some concern of local recurrence in the thyroidectomy bed which is asymptomatic at this time. MRI thoracic spine 12/25/2015: No new abnormality since the prior examination. Status post T1-5 fusion for T3 decompression. Mass effect on the cord at the T3 level appears improved compared to the prior exam. Postoperative fluid collection likely representing a seroma seen on the prior  study has slightly decreased in size.  2) grade 1 oral mucositis related to Lenvatinib. 3) FDG avidity in the rectum - his was discussed with the patient she reports constipation and feels that this is probably related to her hard stools and wanted to hold off on additional GI workup at this time. No progression of symptoms.  4) abnormal LFTs with borderline elevation of transaminases and increase in alkaline phosphatase. Mild transaminitis could be from her TKI. Head CT did not show any liver involvement or biliary obstruction. Elevation in alkaline phosphatase could be from additional bone involvement. Repeat labs today shows normalization of her LFTs Plan -patient blood counts and chemistry appeared to be stable. -Given her cord compression appears to have improved and that she appears to have clinical benefit we'll continue Lenvatinib at the current dose of '18mg'$  po daily. -pending thyroglobulin level and thyroglobulin antibody levels from today --Her TSH appears well suppressed- she will be following with Dr. Chalmers Cater from endocrinology for this. Oral mucositis has improved and she will continue her salt and baking soda mouthwashes and Cepacol as needed -follow-up with Dr. Cyndy Freeze as per his recommendations.  RTC with [name] in 8 weeks with labs and rpt PET/CT  5) history of right-sided pulmonary embolism on chronic anticoagulation with Rivaroxaban. Monitor for bleeding.  6) Uncontrolled HTN. Patient has a history of essential hypertension and was previously on lisinopril '5mg'$  po daily -We restarted her on this and recommended she continue follow-up with her primary care physician for continued management.  7) mild folliculitis with sores related to skin picking. -She was recommended to keep her skin well moisturized. -Topical Bactroban to her nostrils and skin sores. -If her folliculitis gets worse she might need oral antibiotics and a dermatology evaluation. Will defer the start primary  care physician.  All of the patients questions were answered with apparent satisfaction. The patient knows to call the clinic with any problems, questions or concerns.  I spent 30 minutes counseling the  patient face to face. The total time spent in the appointment was 40 minutes and more than 50% was on counseling and direct patient cares.    Sullivan Lone MD [place] AAHIVMS [facility] Hematology/Oncology Physician [facility]  (Office):       [phone] (Work cell):  [phone] (Fax):           [phone]

## 2016-02-01 LAB — THYROGLOBULIN ANTIBODY: THYROGLOBULIN ANTIBODY: 3.1 [IU]/mL — AB (ref 0.0–0.9)

## 2016-02-04 ENCOUNTER — Ambulatory Visit: Payer: BLUE CROSS/BLUE SHIELD | Admitting: Physical Therapy

## 2016-02-05 ENCOUNTER — Ambulatory Visit: Payer: BLUE CROSS/BLUE SHIELD | Admitting: Physical Therapy

## 2016-02-08 ENCOUNTER — Telehealth: Payer: Self-pay

## 2016-02-08 NOTE — Telephone Encounter (Signed)
Patient called asking of appointment date and time. Notified patient and confirmed.

## 2016-02-10 LAB — THYROGLOBULIN LEVEL: THYROGLOBULIN (TG-RIA): 3.1 ng/mL

## 2016-02-12 ENCOUNTER — Encounter: Payer: Self-pay | Admitting: Physical Medicine & Rehabilitation

## 2016-02-12 ENCOUNTER — Encounter
Payer: BLUE CROSS/BLUE SHIELD | Attending: Physical Medicine & Rehabilitation | Admitting: Physical Medicine & Rehabilitation

## 2016-02-12 VITALS — BP 185/105

## 2016-02-12 DIAGNOSIS — Z5189 Encounter for other specified aftercare: Secondary | ICD-10-CM | POA: Insufficient documentation

## 2016-02-12 DIAGNOSIS — M109 Gout, unspecified: Secondary | ICD-10-CM | POA: Diagnosis not present

## 2016-02-12 DIAGNOSIS — Z86711 Personal history of pulmonary embolism: Secondary | ICD-10-CM | POA: Diagnosis not present

## 2016-02-12 DIAGNOSIS — G839 Paralytic syndrome, unspecified: Secondary | ICD-10-CM

## 2016-02-12 DIAGNOSIS — I10 Essential (primary) hypertension: Secondary | ICD-10-CM

## 2016-02-12 DIAGNOSIS — M25511 Pain in right shoulder: Secondary | ICD-10-CM | POA: Insufficient documentation

## 2016-02-12 DIAGNOSIS — G8220 Paraplegia, unspecified: Secondary | ICD-10-CM | POA: Insufficient documentation

## 2016-02-12 DIAGNOSIS — C73 Malignant neoplasm of thyroid gland: Secondary | ICD-10-CM | POA: Diagnosis not present

## 2016-02-12 DIAGNOSIS — M75101 Unspecified rotator cuff tear or rupture of right shoulder, not specified as traumatic: Secondary | ICD-10-CM | POA: Diagnosis not present

## 2016-02-12 DIAGNOSIS — C7951 Secondary malignant neoplasm of bone: Secondary | ICD-10-CM | POA: Insufficient documentation

## 2016-02-12 DIAGNOSIS — M25512 Pain in left shoulder: Secondary | ICD-10-CM | POA: Insufficient documentation

## 2016-02-12 DIAGNOSIS — M75102 Unspecified rotator cuff tear or rupture of left shoulder, not specified as traumatic: Secondary | ICD-10-CM

## 2016-02-12 DIAGNOSIS — N319 Neuromuscular dysfunction of bladder, unspecified: Secondary | ICD-10-CM | POA: Diagnosis not present

## 2016-02-12 DIAGNOSIS — Z9889 Other specified postprocedural states: Secondary | ICD-10-CM | POA: Insufficient documentation

## 2016-02-12 DIAGNOSIS — M10071 Idiopathic gout, right ankle and foot: Secondary | ICD-10-CM

## 2016-02-12 MED ORDER — LISINOPRIL 10 MG PO TABS: 10.0000 mg | ORAL_TABLET | Freq: Every day | ORAL | 4 refills | Status: DC

## 2016-02-12 MED ORDER — COLCHICINE 0.6 MG PO TABS: 0.6000 mg | ORAL_TABLET | Freq: Two times a day (BID) | ORAL | 3 refills | Status: DC | PRN

## 2016-02-12 NOTE — Patient Instructions (Signed)
Shoulder Impingement Syndrome Rehab Ask your health care provider which exercises are safe for you. Do exercises exactly as told by your health care provider and adjust them as directed. It is normal to feel mild stretching, pulling, tightness, or discomfort as you do these exercises, but you should stop right away if you feel sudden pain or your pain gets worse.Do not begin these exercises until told by your health care provider. Stretching and range of motion exercise This exercise warms up your muscles and joints and improves the movement and flexibility of your shoulder. This exercise also helps to relieve pain and stiffness. Exercise A: Passive horizontal adduction 1. Sit or stand and pull your left / right elbow across your chest, toward your other shoulder. Stop when you feel a gentle stretch in the back of your shoulder and upper arm.  Keep your arm at shoulder height.  Keep your arm as close to your body as you comfortably can. 2. Hold for __________ seconds. 3. Slowly return to the starting position. Repeat __________ times. Complete this exercise __________ times a day. Strengthening exercises These exercises build strength and endurance in your shoulder. Endurance is the ability to use your muscles for a long time, even after they get tired. Exercise B: External rotation, isometric 1. Stand or sit in a doorway, facing the door frame. 2. Bend your left / right elbow and place the back of your wrist against the door frame. Only your wrist should be touching the frame. Keep your upper arm at your side. 3. Gently press your wrist against the door frame, as if you are trying to push your arm away from your abdomen.  Avoid shrugging your shoulder while you press your hand against the door frame. Keep your shoulder blade tucked down toward the middle of your back. 4. Hold for __________ seconds. 5. Slowly release the tension, and relax your muscles completely before you do the exercise  again. Repeat __________ times. Complete this exercise __________ times a day. Exercise C: Internal rotation, isometric 1. Stand or sit in a doorway, facing the door frame. 2. Bend your left / right elbow and place the inside of your wrist against the door frame. Only your wrist should be touching the frame. Keep your upper arm at your side. 3. Gently press your wrist against the door frame, as if you are trying to push your arm toward your abdomen.  Avoid shrugging your shoulder while you press your hand against the door frame. Keep your shoulder blade tucked down toward the middle of your back. 4. Hold for __________ seconds. 5. Slowly release the tension, and relax your muscles completely before you do the exercise again. Repeat __________ times. Complete this exercise __________ times a day. Exercise D: Scapular protraction, supine 1. Lie on your back on a firm surface. Hold a __________ weight in your left / right hand. 2. Raise your left / right arm straight into the air so your hand is directly above your shoulder joint. 3. Push the weight into the air so your shoulder lifts off of the surface that you are lying on. Do not move your head, neck, or back. 4. Hold for __________ seconds. 5. Slowly return to the starting position. Let your muscles relax completely before you repeat this exercise. Repeat __________ times. Complete this exercise __________ times a day. Exercise E: Scapular retraction 1. Sit in a stable chair without armrests, or stand. 2. Secure an exercise band to a stable object in front of   you so the band is at shoulder height. 3. Hold one end of the exercise band in each hand. Your palms should face down. 4. Squeeze your shoulder blades together and move your elbows slightly behind you. Do not shrug your shoulders while you do this. 5. Hold for __________ seconds. 6. Slowly return to the starting position. Repeat __________ times. Complete this exercise __________ times  a day. Exercise F: Shoulder extension 1. Sit in a stable chair without armrests, or stand. 2. Secure an exercise band to a stable object in front of you where the band is above shoulder height. 3. Hold one end of the exercise band in each hand. 4. Straighten your elbows and lift your hands up to shoulder height. 5. Squeeze your shoulder blades together and pull your hands down to the sides of your thighs. Stop when your hands are straight down by your sides. Do not let your hands go behind your body. 6. Hold for __________ seconds. 7. Slowly return to the starting position. Repeat __________ times. Complete this exercise __________ times a day. This information is not intended to replace advice given to you by your health care provider. Make sure you discuss any questions you have with your health care provider. Document Released: 03/10/2005 Document Revised: 11/15/2015 Document Reviewed: 02/10/2015 Elsevier Interactive Patient Education  2017 [employer]  

## 2016-02-12 NOTE — Progress Notes (Signed)
Subjective:    Patient ID: Sarah [name], female    DOB: [birth year], 50 y.o.   MRN: [id number]  HPI   Sarah [name] is here in follow up of her paraplegia. She had originally come in for shoulder injections but her BP was too high to perform today. She has noticed more shoulder pain with personal care tasks and overhead activties.   Her primary recently started her on '5mg'$  lisinopril. She forgot to take this morning. She reports that her BP has been elevated at baseline at home. I looked at her last visit here, and her DBP was elevated at that time as well.   Additionally, she has developed increased pain and redness in her right big toe. It worsened after having some wine a few nights ago, and has yet to improve. She has had gout flares in the past apparently. The toe is sensitive to even touch at times.   Pain Inventory Average Pain 3 Pain Right Now 4 My pain is burning, tingling and aching  In the last 24 hours, has pain interfered with the following? General activity 1 Relation with others 0 Enjoyment of life 1 What TIME of day is your pain at its worst? morning evening and night Sleep (in general) Good  Pain is worse with: sitting and inactivity Pain improves with: nothing Relief from Meds: no med  Mobility use a wheelchair needs help with transfers  Function disabled: date disabled . I need assistance with the following:  dressing, bathing, toileting and shopping  Neuro/Psych weakness tingling spasms  Prior Studies Any changes since last visit?  no  Physicians involved in your care Any changes since last visit?  no   Family History  Problem Relation Age of Onset  . Hypertension Mother   . Diabetes Mother   . Hypertension Father   . Stroke Father    Social History   Social History  . Marital status: Single    Spouse name: N/A  . Number of children: N/A  . Years of education: N/A   Social History Main Topics  . Smoking status: Never Smoker  . Smokeless  tobacco: Never Used  . Alcohol use No  . Drug use: No  . Sexual activity: Not Currently   Other Topics Concern  . Not on file   Social History Narrative  . No narrative on file   Past Surgical History:  Procedure Laterality Date  . LAMINECTOMY N/A 12/14/2013   Procedure: THORACIC LAMINECTOMY FOR TUMOR THORACIC THREE;  Surgeon: Ashok Pall, MD;  Location: [place] [facility]S;  Service: Neurosurgery;  Laterality: N/A;  THORACIC LAMINECTOMY FOR TUMOR THORACIC THREE  . LAMINECTOMY N/A 07/05/2015   Procedure: THORACIC LAMINECTOMY FOR TUMOR;  Surgeon: Ashok Pall, MD;  Location: [place] NEUR[facility] ORS;  Service: Neurosurgery;  Laterality: N/A;  . POSTERIOR LUMBAR FUSION 4 LEVEL N/A 12/30/2013   Procedure: Thoracic one-Thoracic five posterior thoracic fusion with pedicle screws;  Surgeon: Ashok Pall, MD;  Location: [place] NEURO ORS;  Service: Neurosurgery;  Laterality: N/A;  Thoracic one-Thoracic five posterior thoracic fusion with pedicle screws  . THYROIDECTOMY N/A 01/09/2014   Procedure: TOTAL THYROIDECTOMY;  Surgeon: Izora Gala, MD;  Location: [place];  Service: ENT;  Laterality: N/A;  . TONSILLECTOMY     Past Medical History:  Diagnosis Date  . 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 Chronic back pain   . DDD (degenerative disc disease), cervical   . DJD (degenerative joint disease)   .  History of rectal fissure   . HIT (heparin-induced thrombocytopenia) ([place])   . Hypertension   . Obesity   . Pulmonary embolus, right ([place]) 2015  . Rotator cuff tendonitis right   BP (!) 185/105   Opioid Risk Score:   Fall Risk Score:  `1  Depression screen PHQ 2/9  Depression screen [facility] 2/9 02/12/2016 11/13/2015 09/04/2015 12/26/2014 09/19/2014 04/20/2014 03/03/2014  Decreased Interest 0 0 0 0 0 0 0  Down, Depressed, Hopeless 0 0 0 1 1 0 1  PHQ - 2 Score 0 0 0 1 1 0 1  Altered sleeping - - 0 - 0 - -  Tired, decreased energy - - 0 - 1 - -  Change in appetite - - 0 - 0 - -    Feeling bad or failure about yourself  - - 0 - 0 - -  Trouble concentrating - - 0 - 0 - -  Moving slowly or fidgety/restless - - 0 - 0 - -  Suicidal thoughts - - 0 - 0 - -  PHQ-9 Score - - 0 - 2 - -  Difficult doing work/chores - - Not difficult at all - - - -  Some recent data might be hidden    Review of Systems  Constitutional: Negative.   HENT: Negative.   Eyes: Negative.   Respiratory: Negative.   Cardiovascular: Positive for leg swelling.  Gastrointestinal: Negative.   Endocrine: Negative.   Genitourinary: Negative.   Musculoskeletal: Negative.   Skin: Positive for rash.  Allergic/Immunologic: Negative.   Neurological: Negative.   Hematological: Negative.   Psychiatric/Behavioral: Negative.   All other systems reviewed and are negative.      Objective:   Physical Exam Constitutional: She appears well-developed. NAD. Obese HENT: NCAT.  Eyes: EOMI  Cardiovascular: RRR. Respiratory: chest clear.  GI: soft BS +.  Musculoskeletal: MTP of first right toe red and swollen, very tender to touch. Shoulders with mild impingement signs bilaterally Neurological: She is alert and oriented.  Flat but speech clear and able to follow commands without difficulty.  BUE 5/5 proximal to distal.  RLE: HF, KE 3-/5, ankle dorsi/plantar flexion remain 4/5 LLE: 2/5 proximal to distal  Extensor tone in lower extremities approximately1-2/4 Skin: Skin is warm and dry.   [name] Kitchen Psychiatric: pleasant and appropriate as always   Assessment & Plan:  1. 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 H/o PE/Anticoagulation: Pharmaceutical: Xarelto, 3. Pain Management: Controlled currently on oxycodone prn.   4. Metastatic cancer with T3 pathologic FX with cord compression and new osseous mets T12- L3/S1:  -per onc 5. Spasticity:  increasing tone on exam             - Continue baclofen 20 mg 3 X day with valium prn.                 6. Neurogenic bowel :   -colace q am   7. Neurogenic bladder: emptying bladder 8. Gout right first toe: colchicine prn 9. Bilateral shoulder pain---over use due paraplegia/secondary injury  -shoulder exercises were provided  -consider injections if bp better 10. BP: increase lisinopril to '10mg'$  daily  Thirty minutes of face to face patient care time were spent during this visit. All questions were encouraged and answered.

## 2016-02-13 ENCOUNTER — Other Ambulatory Visit: Payer: Self-pay | Admitting: *Deleted

## 2016-02-13 MED ORDER — BACLOFEN 20 MG PO TABS: ORAL_TABLET | ORAL | 2 refills | Status: DC

## 2016-02-13 NOTE — Telephone Encounter (Signed)
Refill request for baclofen. Refilled.

## 2016-02-27 ENCOUNTER — Telehealth: Payer: Self-pay

## 2016-02-27 NOTE — Telephone Encounter (Signed)
I have no orders in my box or on my desk for w/c.  Would be happy to sign when I receive them

## 2016-02-27 NOTE — Telephone Encounter (Signed)
Received call today waiting for script/orders to be signed for wheel chair repair.

## 2016-02-29 NOTE — Telephone Encounter (Signed)
Contacted [employer], explained situation, orders were found and signed by Dr. Naaman Plummer. Orders for wheelchair parts are now awaiting insurance approval

## 2016-03-04 ENCOUNTER — Other Ambulatory Visit: Payer: Self-pay | Admitting: Family Medicine

## 2016-03-20 ENCOUNTER — Encounter: Payer: BLUE CROSS/BLUE SHIELD | Admitting: Podiatry

## 2016-03-21 NOTE — Progress Notes (Signed)
This encounter was created in error - please disregard.

## 2016-03-25 ENCOUNTER — Encounter: Payer: BLUE CROSS/BLUE SHIELD | Admitting: Physical Medicine & Rehabilitation

## 2016-04-01 ENCOUNTER — Ambulatory Visit (HOSPITAL_COMMUNITY): Payer: BLUE CROSS/BLUE SHIELD

## 2016-04-03 ENCOUNTER — Other Ambulatory Visit: Payer: BLUE CROSS/BLUE SHIELD

## 2016-04-03 ENCOUNTER — Ambulatory Visit: Payer: BLUE CROSS/BLUE SHIELD | Admitting: Hematology

## 2016-04-08 ENCOUNTER — Other Ambulatory Visit: Payer: Self-pay | Admitting: *Deleted

## 2016-04-09 MED ORDER — LEVOTHYROXINE SODIUM 200 MCG PO TABS: 200.0000 ug | ORAL_TABLET | Freq: Every day | ORAL | 3 refills | Status: DC

## 2016-04-22 ENCOUNTER — Ambulatory Visit: Payer: BLUE CROSS/BLUE SHIELD | Admitting: Physical Medicine & Rehabilitation

## 2016-04-22 ENCOUNTER — Encounter (HOSPITAL_COMMUNITY): Payer: BLUE CROSS/BLUE SHIELD

## 2016-04-29 ENCOUNTER — Telehealth: Payer: Self-pay | Admitting: Licensed Clinical Social Worker

## 2016-04-29 ENCOUNTER — Other Ambulatory Visit: Payer: BLUE CROSS/BLUE SHIELD

## 2016-04-29 ENCOUNTER — Ambulatory Visit: Payer: BLUE CROSS/BLUE SHIELD | Admitting: Hematology

## 2016-04-29 NOTE — Progress Notes (Signed)
Call from patient requesting [facility] assistance with medication starting March.   Patient currently has [employer] and will be changing to [facility] March 1st.  [name] concerned that she will not be able to afford her Lenvima.  Patient currently receives Lenvima from a company in [place] paid by her [place].  LCSW explained that [facility] does not assist with ongoing medication but one time assistance for emergencies. Discussed other options with patient for getting her medication and if she has contact the company in [place] to see if she will have a copay with the insurance change.  Patient has not contacted them.   Plan:  1. Patient will call the company in [place], explain the upcoming change in her insurance and find out how much she will have to pay for her medication.   2. Patient will call LCSW if she has other questions or concerns   Casimer Lanius, LCSW Licensed Clinical Social Worker [place]   [phone] 12:13 PM

## 2016-05-06 ENCOUNTER — Encounter: Payer: BLUE CROSS/BLUE SHIELD | Admitting: Physical Medicine & Rehabilitation

## 2016-05-13 ENCOUNTER — Encounter: Payer: Self-pay | Admitting: Registered Nurse

## 2016-05-13 ENCOUNTER — Encounter: Payer: BLUE CROSS/BLUE SHIELD | Attending: Physical Medicine & Rehabilitation | Admitting: Registered Nurse

## 2016-05-13 VITALS — BP 151/90 | HR 72

## 2016-05-13 DIAGNOSIS — M25511 Pain in right shoulder: Secondary | ICD-10-CM | POA: Insufficient documentation

## 2016-05-13 DIAGNOSIS — Z86711 Personal history of pulmonary embolism: Secondary | ICD-10-CM | POA: Diagnosis not present

## 2016-05-13 DIAGNOSIS — G839 Paralytic syndrome, unspecified: Secondary | ICD-10-CM | POA: Diagnosis not present

## 2016-05-13 DIAGNOSIS — M109 Gout, unspecified: Secondary | ICD-10-CM | POA: Diagnosis not present

## 2016-05-13 DIAGNOSIS — N319 Neuromuscular dysfunction of bladder, unspecified: Secondary | ICD-10-CM | POA: Insufficient documentation

## 2016-05-13 DIAGNOSIS — M75101 Unspecified rotator cuff tear or rupture of right shoulder, not specified as traumatic: Secondary | ICD-10-CM

## 2016-05-13 DIAGNOSIS — K592 Neurogenic bowel, not elsewhere classified: Secondary | ICD-10-CM | POA: Diagnosis not present

## 2016-05-13 DIAGNOSIS — Z9889 Other specified postprocedural states: Secondary | ICD-10-CM | POA: Insufficient documentation

## 2016-05-13 DIAGNOSIS — R1314 Dysphagia, pharyngoesophageal phase: Secondary | ICD-10-CM

## 2016-05-13 DIAGNOSIS — Z5189 Encounter for other specified aftercare: Secondary | ICD-10-CM | POA: Insufficient documentation

## 2016-05-13 DIAGNOSIS — C7951 Secondary malignant neoplasm of bone: Secondary | ICD-10-CM | POA: Diagnosis not present

## 2016-05-13 DIAGNOSIS — C73 Malignant neoplasm of thyroid gland: Secondary | ICD-10-CM | POA: Diagnosis not present

## 2016-05-13 DIAGNOSIS — M25512 Pain in left shoulder: Secondary | ICD-10-CM | POA: Insufficient documentation

## 2016-05-13 DIAGNOSIS — M75102 Unspecified rotator cuff tear or rupture of left shoulder, not specified as traumatic: Secondary | ICD-10-CM | POA: Diagnosis not present

## 2016-05-13 DIAGNOSIS — R5381 Other malaise: Secondary | ICD-10-CM | POA: Diagnosis not present

## 2016-05-13 DIAGNOSIS — IMO0002 Reserved for concepts with insufficient information to code with codable children: Secondary | ICD-10-CM

## 2016-05-13 DIAGNOSIS — G8220 Paraplegia, unspecified: Secondary | ICD-10-CM | POA: Insufficient documentation

## 2016-05-13 MED ORDER — ONDANSETRON HCL 4 MG PO TABS: 4.0000 mg | ORAL_TABLET | Freq: Four times a day (QID) | ORAL | 1 refills | Status: DC

## 2016-05-13 MED ORDER — BACLOFEN 20 MG PO TABS: ORAL_TABLET | ORAL | 2 refills | Status: DC

## 2016-05-13 MED ORDER — SENNA 8.6 MG PO TABS: 2.0000 | ORAL_TABLET | Freq: Every day | ORAL | 0 refills | Status: DC

## 2016-05-13 NOTE — Progress Notes (Signed)
Subjective:    Patient ID: Sarah [name], female    DOB: 22-Jun-1965, 51 y.o.   MRN: [id number]  HPI: [name] is a 51 year old female who returns for follow up appointment of her paraplegia. She states her pain is located in her left shoulder and lower back. She rates her pain 2. Her current exercise regime performing stretching exercises.    Pain Inventory Average Pain 2 Pain Right Now 2 My pain is constant and tingling  In the last 24 hours, has pain interfered with the following? General activity 1 Relation with others 0 Enjoyment of life 0 What TIME of day is your pain at its worst? evening Sleep (in general) Good  Pain is worse with: sitting Pain improves with: medication Relief from Meds: 5  Mobility ability to climb steps?  no do you drive?  no use a wheelchair needs help with transfers  Function disabled: date disabled . I need assistance with the following:  bathing  Neuro/Psych numbness tingling spasms  Prior Studies Any changes since last visit?  no  Physicians involved in your care Any changes since last visit?  no   Family History  Problem Relation Age of Onset  . Hypertension Mother   . Diabetes Mother   . Hypertension Father   . Stroke Father    Social History   Social History  . Marital status: Single    Spouse name: N/A  . Number of children: N/A  . Years of education: N/A   Social History Main Topics  . Smoking status: Never Smoker  . Smokeless tobacco: Never Used  . Alcohol use No  . Drug use: No  . Sexual activity: Not Currently   Other Topics Concern  . Not on file   Social History Narrative  . No narrative on file   Past Surgical History:  Procedure Laterality Date  . LAMINECTOMY N/A 12/14/2013   Procedure: THORACIC LAMINECTOMY FOR TUMOR THORACIC THREE;  Surgeon: Ashok Pall, MD;  Location: [place] NEURO ORS;  Service: Neurosurgery;  Laterality: N/A;  THORACIC LAMINECTOMY FOR TUMOR THORACIC THREE  . LAMINECTOMY  N/A 07/05/2015   Procedure: THORACIC LAMINECTOMY FOR TUMOR;  Surgeon: Ashok Pall, MD;  Location: [place] NEURO ORS;  Service: Neurosurgery;  Laterality: N/A;  . POSTERIOR LUMBAR FUSION 4 LEVEL N/A 12/30/2013   Procedure: Thoracic one-Thoracic five posterior thoracic fusion with pedicle screws;  Surgeon: Ashok Pall, MD;  Location: [place] NEURO ORS;  Service: Neurosurgery;  Laterality: N/A;  Thoracic one-Thoracic five posterior thoracic fusion with pedicle screws  . THYROIDECTOMY N/A 01/09/2014   Procedure: TOTAL THYROIDECTOMY;  Surgeon: Izora Gala, MD;  Location: [place];  Service: ENT;  Laterality: N/A;  . TONSILLECTOMY     Past Medical History:  Diagnosis Date  . 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 Chronic back pain   . DDD (degenerative disc disease), cervical   . DJD (degenerative joint disease)   . History of rectal fissure   . HIT (heparin-induced thrombocytopenia) ([place])   . Hypertension   . Obesity   . Pulmonary embolus, right ([place]) 2015  . Rotator cuff tendonitis right   There were no vitals taken for this visit.  Opioid Risk Score:   Fall Risk Score:  `1  Depression screen PHQ 2/9  Depression screen [facility] 2/9 02/12/2016 11/13/2015 09/04/2015 12/26/2014 09/19/2014 04/20/2014 03/03/2014  Decreased Interest 0 0 0 0 0 0 0  Down, Depressed, Hopeless 0  0 0 1 1 0 1  PHQ - 2 Score 0 0 0 1 1 0 1  Altered sleeping - - 0 - 0 - -  Tired, decreased energy - - 0 - 1 - -  Change in appetite - - 0 - 0 - -  Feeling bad or failure about yourself  - - 0 - 0 - -  Trouble concentrating - - 0 - 0 - -  Moving slowly or fidgety/restless - - 0 - 0 - -  Suicidal thoughts - - 0 - 0 - -  PHQ-9 Score - - 0 - 2 - -  Difficult doing work/chores - - Not difficult at all - - - -  Some recent data might be hidden    Review of Systems  Constitutional: Negative.   HENT: Negative.   Eyes: Negative.   Respiratory: Negative.   Cardiovascular: Negative.     Gastrointestinal: Negative.   Endocrine: Negative.   Genitourinary: Negative.   Musculoskeletal: Positive for back pain and joint swelling.  Skin: Negative.   Allergic/Immunologic: Negative.   Neurological: Negative.   Hematological: Negative.   Psychiatric/Behavioral: Negative.   All other systems reviewed and are negative.      Objective:   Physical Exam  Constitutional: She is oriented to person, place, and time. She appears well-developed and well-nourished.  HENT:  Head: Normocephalic and atraumatic.  Neck: Normal range of motion. Neck supple.  Cardiovascular: Normal rate and regular rhythm.   Pulmonary/Chest: Effort normal and breath sounds normal.  Musculoskeletal:  Normal Muscle Bulk and Muscle Testing Reveals: Upper Extremities: Full ROM and Muscle Strength 5/5 Lower Extremities: Right Full ROM and Muscle Strength 4/5 Left: Decreased ROM and Muscle Strength 3/5 Pitting Edema in Lower Extremities Right: 2+ and Left 3+ Arrived in wheelchair   Neurological: She is alert and oriented to person, place, and time.  Skin: Skin is warm and dry.  Psychiatric: She has a normal mood and affect.  Nursing note and vitals reviewed.         Assessment & Plan:  1. 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 Continue to Monitor 2. H/o PE/Anticoagulation: Continue: Xarelto, 3. Metastatic cancer with T3 pathologic FX with cord compression and new osseous mets T12- L3/S1: Oncology Following 4. Spasticity: Continue baclofen 20 mg. 5.. Neurogenic bowel :Continue Senna 6. Neurogenic bladder: emptying bladder. Continue to Monitor 7. Physical Deconditioning: RX: Physical Therapy 8. Bilateral Rotator Cuff Syndrome: Continue HEP.   30 minutes of face to face patient care time was spent during this visit. All questions were encouraged and answered.   F/U in 3  months

## 2016-05-22 ENCOUNTER — Telehealth: Payer: Self-pay | Admitting: Hematology

## 2016-05-22 NOTE — Telephone Encounter (Signed)
Patient called requesting appointment for f/u. Rescheduled missed February lab/fu and gave patient appointments for 3/27. Patient will have scan 3/13.

## 2016-05-29 ENCOUNTER — Other Ambulatory Visit: Payer: Self-pay | Admitting: Physical Medicine & Rehabilitation

## 2016-05-29 DIAGNOSIS — IMO0002 Reserved for concepts with insufficient information to code with codable children: Secondary | ICD-10-CM

## 2016-05-29 DIAGNOSIS — G8220 Paraplegia, unspecified: Secondary | ICD-10-CM

## 2016-06-03 ENCOUNTER — Ambulatory Visit (HOSPITAL_COMMUNITY)
Admission: RE | Admit: 2016-06-03 | Discharge: 2016-06-03 | Disposition: A | Discharge status: Home / Self Care | Payer: Medicare Other | Source: Ambulatory Visit | Attending: Hematology | Admitting: Hematology

## 2016-06-03 DIAGNOSIS — R918 Other nonspecific abnormal finding of lung field: Secondary | ICD-10-CM | POA: Insufficient documentation

## 2016-06-03 DIAGNOSIS — C7951 Secondary malignant neoplasm of bone: Secondary | ICD-10-CM | POA: Diagnosis not present

## 2016-06-03 DIAGNOSIS — C73 Malignant neoplasm of thyroid gland: Secondary | ICD-10-CM | POA: Diagnosis not present

## 2016-06-03 LAB — GLUCOSE, CAPILLARY: GLUCOSE-CAPILLARY: 103 mg/dL — AB (ref 65–99)

## 2016-06-03 MED ORDER — FLUDEOXYGLUCOSE F - 18 (FDG) INJECTION
12.3000 | Freq: Once | INTRAVENOUS | Status: AC | PRN
  Administered 2016-06-03: 12.3 via INTRAVENOUS

## 2016-06-04 ENCOUNTER — Other Ambulatory Visit: Payer: Self-pay | Admitting: *Deleted

## 2016-06-04 MED ORDER — LENVATINIB (18 MG DAILY DOSE) 10 MG & 2 X 4 MG PO CPPK: 18.0000 mg | ORAL_CAPSULE | Freq: Every day | ORAL | 2 refills | Status: DC

## 2016-06-09 DIAGNOSIS — R29898 Other symptoms and signs involving the musculoskeletal system: Secondary | ICD-10-CM | POA: Diagnosis not present

## 2016-06-09 DIAGNOSIS — E119 Type 2 diabetes mellitus without complications: Secondary | ICD-10-CM | POA: Diagnosis not present

## 2016-06-09 DIAGNOSIS — C7951 Secondary malignant neoplasm of bone: Secondary | ICD-10-CM | POA: Diagnosis not present

## 2016-06-09 DIAGNOSIS — G839 Paralytic syndrome, unspecified: Secondary | ICD-10-CM | POA: Diagnosis not present

## 2016-06-09 DIAGNOSIS — M545 Low back pain: Secondary | ICD-10-CM | POA: Diagnosis not present

## 2016-06-17 ENCOUNTER — Encounter: Payer: Self-pay | Admitting: Hematology

## 2016-06-17 ENCOUNTER — Other Ambulatory Visit (HOSPITAL_BASED_OUTPATIENT_CLINIC_OR_DEPARTMENT_OTHER): Payer: Medicare Other

## 2016-06-17 ENCOUNTER — Telehealth: Payer: Self-pay | Admitting: Hematology

## 2016-06-17 ENCOUNTER — Ambulatory Visit (HOSPITAL_BASED_OUTPATIENT_CLINIC_OR_DEPARTMENT_OTHER): Payer: Medicare Other | Admitting: Hematology

## 2016-06-17 VITALS — BP 157/94 | HR 85 | Temp 98.9°F | Resp 18

## 2016-06-17 DIAGNOSIS — Z7901 Long term (current) use of anticoagulants: Secondary | ICD-10-CM

## 2016-06-17 DIAGNOSIS — C7800 Secondary malignant neoplasm of unspecified lung: Secondary | ICD-10-CM | POA: Diagnosis not present

## 2016-06-17 DIAGNOSIS — C778 Secondary and unspecified malignant neoplasm of lymph nodes of multiple regions: Secondary | ICD-10-CM

## 2016-06-17 DIAGNOSIS — C7951 Secondary malignant neoplasm of bone: Secondary | ICD-10-CM | POA: Diagnosis not present

## 2016-06-17 DIAGNOSIS — C73 Malignant neoplasm of thyroid gland: Secondary | ICD-10-CM

## 2016-06-17 DIAGNOSIS — Z86711 Personal history of pulmonary embolism: Secondary | ICD-10-CM

## 2016-06-17 LAB — COMPREHENSIVE METABOLIC PANEL
ALBUMIN: 3.3 g/dL — AB (ref 3.5–5.0)
ALT: 39 U/L (ref 0–55)
AST: 39 U/L — AB (ref 5–34)
Alkaline Phosphatase: 170 U/L — ABNORMAL HIGH (ref 40–150)
Anion Gap: 8 mEq/L (ref 3–11)
BUN: 14 mg/dL (ref 7.0–26.0)
CO2: 29 mEq/L (ref 22–29)
CREATININE: 0.9 mg/dL (ref 0.6–1.1)
Calcium: 9 mg/dL (ref 8.4–10.4)
Chloride: 105 mEq/L (ref 98–109)
EGFR: 73 mL/min/{1.73_m2} — ABNORMAL LOW (ref >90)
Glucose: 99 mg/dl (ref 70–140)
POTASSIUM: 4.2 meq/L (ref 3.5–5.1)
Sodium: 142 mEq/L (ref 136–145)
Total Bilirubin: 0.65 mg/dL (ref 0.20–1.20)
Total Protein: 6.1 g/dL — ABNORMAL LOW (ref 6.4–8.3)

## 2016-06-17 LAB — CBC & DIFF AND RETIC
BASO%: 0.4 % (ref 0.0–2.0)
Basophils Absolute: 0 10*3/uL (ref 0.0–0.1)
EOS%: 3.9 % (ref 0.0–7.0)
Eosinophils Absolute: 0.1 10*3/uL (ref 0.0–0.5)
HCT: 33.9 % — ABNORMAL LOW (ref 34.8–46.6)
HEMOGLOBIN: 10.7 g/dL — AB (ref 11.6–15.9)
IMMATURE RETIC FRACT: 7.8 % (ref 1.60–10.00)
LYMPH%: 37.6 % (ref 14.0–49.7)
MCH: 29.1 pg (ref 25.1–34.0)
MCHC: 31.6 g/dL (ref 31.5–36.0)
MCV: 92.1 fL (ref 79.5–101.0)
MONO#: 0.2 10*3/uL (ref 0.1–0.9)
MONO%: 7.5 % (ref 0.0–14.0)
NEUT#: 1.4 10*3/uL — ABNORMAL LOW (ref 1.5–6.5)
NEUT%: 50.6 % (ref 38.4–76.8)
PLATELETS: 146 10*3/uL (ref 145–400)
RBC: 3.68 10*6/uL — AB (ref 3.70–5.45)
RDW: 15.2 % — ABNORMAL HIGH (ref 11.2–14.5)
Retic %: 0.76 % (ref 0.70–2.10)
Retic Ct Abs: 27.97 10*3/uL — ABNORMAL LOW (ref 33.70–90.70)
WBC: 2.8 10*3/uL — ABNORMAL LOW (ref 3.9–10.3)
lymph#: 1.1 10*3/uL (ref 0.9–3.3)

## 2016-06-17 LAB — TSH: TSH: <0.08 m(IU)/L — ABNORMAL LOW (ref 0.308–3.960)

## 2016-06-17 MED ORDER — LENVATINIB (18 MG DAILY DOSE) 10 MG & 2 X 4 MG PO CPPK: 18.0000 mg | ORAL_CAPSULE | Freq: Every day | ORAL | 3 refills | Status: DC

## 2016-06-17 NOTE — Progress Notes (Signed)
[name] Kitchen    HEMATOLOGY/ONCOLOGY CLINIC NOTE  Date of Service: .06/17/2016  Patient Care Team: Elberta Leatherwood, MD as PCP - General (Family Medicine)   Dayton Bailiff MD (Spine Surgery - [place]) [phone]. 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  01/09/2014- patient underwent total thyroidectomy by Dr. Elie Goody. Pathology showed Hurthle cell carcinoma with no perineural invasion, presence of lymphovascular invasion and extra thyroidal extension. p T3, p N0, Mx  Received 30 grays in 10 fractions to her C7-T6 lesion finished in November 2015  Received 135 mCi of iodine 131 on 07/21/2014. Whole-body scan was apparently negative.  07/05/2015-07/10/2015 admitted to [facility] for leg weakness. MRI of the T-spine showed recurrent tumor causing spinal cord compression. Patient was emergently taken to the or and underwent a redo thoracic laminectomy of T3 and partial tumor resection.   08/07/2015 PET/CT scan of the chest abdomen pelvis showed slight enlargement of FDG avid left level III and level IV lymph nodes concerning for worsening nodal metastases. Slight enlargement of the hypermetabolic right superior mediastinal mass and multiple metabolically active pulmonary nodules representing worsening metastases. T3 vertebral body and bilateral pedicle metastases status post posture metallic fusion. No residual uptake in the thyroidectomy bed  Thyroglobulin level was 11.7 (thyroglobulin antibody present 9.8)   HISTORY OF PRESENTING ILLNESS:   Sarah [name] is a wonderful 51 y.o. female who has been referred to [place] by Dr Leamon Arnt  (medical oncology at [facility]) for help with local comanagement of the patient's metastatic Hurthle cell thyroid carcinoma with details as noted above. Patient had recent hospitalization from 4/13/7 to 07/10/2015 at [facility] for lower extremity weakness and was noted to have cord compression from recurrent tumor at T3. She is status post redo thoracic laminectomy of T3 and partial tumor resection to try to decompress the core. Pathology was consistent with metastatic Hurthle cell carcinoma.  She has been on dexamethasone 2 mg by mouth twice a day. Continue to have lower extremity weakness and is currently in a skilled nursing facility getting rehabilitation. She feels her lower extremity weakness is possibly getting somewhat better.  Patient had a repeat PET/CT scan at [facility] on 08/07/2015 as noted above which shows progression of her metastatic disease including nodal metastases , pulmonary nodules and right superior mediastinal mass.  MRI of the spine on 08/14/2015 showed improved but persistent cord compression at T3 level.  She was started by Dr. Leamon Arnt on Lenvatinib and 14 mg by mouth daily for progressive metastatic disease after ruling out the option of repeat surgery with Dr. Cyndy Freeze and reradiation with Dr. Ledon Snare.  After a week or so the patient was increased to a dose of '18mg'$  po daily. She notes no diarrhea or other gastrointestinal symptoms. No skin rashes.  Is noted to have oral thrush and was started on nystatin swish and swallow.   Patient has a history of pulmonary embolism 2015 and has continued to be on Xarelto for anticoagulation.   INTERVAL HISTORY  [name] here for her scheduled followup for her metastatic Hurthle cell carcinoma. She notes that she is feeling well and  that she has had some increased strength in her lower extremities that allows her to transfer in and out of her wheelchair and the car much better. She notes that she had a good  time in [facility] recently and was happy about that.  Her last PET/CT scan done on 06/03/2016 showed generally reduced hypermetabolic activity in the previously identified lesions including the pulmonary nodules, dominant upper thoracic metastatic focus, and left lower cervical adenopathy. Mildly reduced size of previous lesions as well. No new lesions. TSH appears well suppressed. No mucositis. No significant GI symptoms. No rash. No other overt toxicities from the Lenvatinib. Labs show some mild anemia at 10.7 and some mild leukopenia at 2.8k with an [place] of 1.4k. She has hesitant to cut back on the dose of her medication since she seems to be having some response. We discussed that we will have to see her sooner for follow-up in 2 months to monitor her counts.  Following with Dr. Chalmers Cater for optimization of TSH suppression with levothyroxine.  MEDICAL HISTORY:  Past Medical History:  Diagnosis Date  . [facility])    FNA of thyroid positive for onconytic/hurthle cell carcinoma  . Chronic back pain   . DDD (degenerative disc disease), cervical   . DJD (degenerative joint disease)   . History of rectal fissure   . HIT (heparin-induced thrombocytopenia) ([place])   . Hypertension   . Obesity   . Pulmonary embolus, right ([place]) 2015  . Rotator cuff tendonitis right    SURGICAL HISTORY: Past Surgical History:  Procedure Laterality Date  . LAMINECTOMY N/A 12/14/2013   Procedure: THORACIC LAMINECTOMY FOR TUMOR THORACIC THREE;  Surgeon: Ashok Pall, MD;  Location: [place] NEURO ORS;  Service: Neurosurgery;  Laterality: N/A;  THORACIC LAMINECTOMY FOR TUMOR THORACIC THREE  . LAMINECTOMY N/A 07/05/2015   Procedure: THORACIC LAMINECTOMY FOR TUMOR;  Surgeon: Ashok Pall, MD;  Location: [place] NEURO ORS;  Service: Neurosurgery;  Laterality: N/A;  . POSTERIOR LUMBAR FUSION 4 LEVEL N/A 12/30/2013   Procedure: Thoracic one-Thoracic five posterior thoracic fusion with pedicle screws;  Surgeon: Ashok Pall, MD;   Location: [place] NEURO ORS;  Service: Neurosurgery;  Laterality: N/A;  Thoracic one-Thoracic five posterior thoracic fusion with pedicle screws  . THYROIDECTOMY N/A 01/09/2014   Procedure: TOTAL THYROIDECTOMY;  Surgeon: Izora Gala, MD;  Location: [place];  Service: ENT;  Laterality: N/A;  . TONSILLECTOMY      SOCIAL HISTORY: Social History   Social History  . Marital status: Single    Spouse name: N/A  . Number of children: N/A  . Years of education: N/A   Occupational History  . Not on file.   Social History Main Topics  . Smoking status: Never Smoker  . Smokeless tobacco: Never Used  . Alcohol use No  . Drug use: No  . Sexual activity: Not Currently   Other Topics Concern  . Not on file   Social History Narrative  . No narrative on file    FAMILY HISTORY: Family History  Problem Relation Age of Onset  . Hypertension Mother   . Diabetes Mother   . Hypertension Father   . Stroke Father     ALLERGIES:  is allergic to bee venom; keflex [cephalexin]; tramadol; heparin; hydrocodone; and oxycodone-acetaminophen.  MEDICATIONS:  Current Outpatient Prescriptions  Medication Sig Dispense Refill  . baclofen (LIORESAL) 20 MG tablet TAKE  (1)  TABLET  THREE TIMES DAILY. 90 tablet 2  . colchicine 0.6 MG tablet Take 1 tablet (0.6  mg total) by mouth 2 (two) times daily as needed. 30 tablet 3  . lenvatinib 18 mg daily dose (LENVIMA) 10 & 4 (2) MG capsule Take 18 mg by mouth daily. 90 capsule 2  . levothyroxine (SYNTHROID, LEVOTHROID) 200 MCG tablet Take 1 tablet (200 mcg total) by mouth daily before breakfast. 90 tablet 3  . lisinopril (PRINIVIL,ZESTRIL) 10 MG tablet Take 1 tablet (10 mg total) by mouth daily. 30 tablet 4  . mupirocin ointment (BACTROBAN) 2 % Place 1 application into the nose 2 (two) times daily. And on sores on extremities and face 22 g 0  . ondansetron (ZOFRAN) 4 MG tablet Take 1 tablet (4 mg total) by mouth every 6 (six) hours. 30 tablet 1  . pantoprazole  (PROTONIX) 40 MG tablet Take 1 tablet (40 mg total) by mouth daily. 30 tablet 5  . senna (SENOKOT) 8.6 MG TABS tablet Take 2 tablets (17.2 mg total) by mouth daily. 120 each 0  . sertraline (ZOLOFT) 25 MG tablet TAKE 1 TABLET ONCE A DAY 30 tablet 5  . XARELTO 20 MG TABS tablet Take 1 tablet (20 mg total) by mouth daily. 30 tablet 0  . zinc oxide (BALMEX) 11.3 % CREA cream Apply 1 application topically daily as needed. Apply to rectum     No current facility-administered medications for this visit.     REVIEW OF SYSTEMS:    10 Point review of Systems was done is negative except as noted above.  PHYSICAL EXAMINATION: ECOG PERFORMANCE STATUS: 3 - Symptomatic, >50% confined to bed  . Vitals:   06/17/16 1301  BP: (!) 157/94  Pulse: 85  Resp: 18  Temp: 98.9 F (37.2 C)   Filed Weights   .There is no height or weight on file to calculate BMI.  GENERAL:alert, in no acute distress and comfortable, sitting in the wheelchair  SKIN: skin color, texture, turgor are normal, no rashes or significant lesions EYES: normal, conjunctiva are pink and non-injected, sclera clear OROPHARYNX: grade 1-2 oral mucositis no overt thrush noted NECK: supple, no JVD, thyroid normal size, non-tender, without nodularity LYMPH:  no palpable lymphadenopathy in the cervical, axillary or inguinal LUNGS: clear to auscultation with normal respiratory effort HEART: regular rate & rhythm,  no murmurs and no lower extremity edema ABDOMEN: abdomen soft, non-tender, normoactive bowel sounds  Musculoskeletal: no cyanosis of digits and no clubbing , bilateral chronic 1-2+ pitting pedal edema PSYCH: alert & oriented x 3 with fluent speech NEURO: Decreased bilateral lower extremity strength    LABORATORY DATA:  I have reviewed the data as listed  . CBC Latest Ref Rng & Units 06/17/2016 01/31/2016 11/19/2015  WBC 3.9 - 10.3 10e3/uL 2.8(L) 3.2(L) 2.7(L)  Hemoglobin 11.6 - 15.9 g/dL 10.7(L) 12.8 13.7  Hematocrit 34.8 -  46.6 % 33.9(L) 37.0 41.1  Platelets 145 - 400 10e3/uL 146 161 146    . CMP Latest Ref Rng & Units 06/17/2016 01/31/2016 11/19/2015  Glucose 70 - 140 mg/dl 99 95 99  BUN 7.0 - 26.0 mg/dL 14.0 11.3 8.2  Creatinine 0.6 - 1.1 mg/dL 0.9 0.9 0.9  Sodium 136 - 145 mEq/L 142 144 145  Potassium 3.5 - 5.1 mEq/L 4.2 4.0 4.1  Chloride 101 - 111 mmol/L - - -  CO2 22 - 29 mEq/L '29 29 29  '$ Calcium 8.4 - 10.4 mg/dL 9.0 8.8 9.3  Total Protein 6.4 - 8.3 g/dL 6.1(L) 6.3(L) 6.4  Total Bilirubin 0.20 - 1.20 mg/dL 0.65 0.90 1.28(H)  Alkaline Phos 40 -  150 U/L 170(H) 132 239(H)  AST 5 - 34 U/L 39(H) 22 46(H)  ALT 0 - 55 U/L 39 9 82(H)   TSH <0.08  Component     Latest Ref Rng & Units 01/31/2016  THYROGLOBULIN (TG-RIA)     ng/mL 3.1  Thyroglobulin Antibody     0.0 - 0.9 IU/mL 3.1 (H)     RADIOGRAPHIC STUDIES: I have personally reviewed the radiological images as listed and agreed with the findings in the report.  .Nm Pet Image Restag (ps) Skull Base To Thigh  Result Date: 06/03/2016 CLINICAL DATA:  Subsequent treatment strategy for Hurthle cell thyroid cancer. EXAM: NUCLEAR MEDICINE PET SKULL BASE TO THIGH TECHNIQUE: 12.3 mCi F-18 FDG was injected intravenously. Full-ring PET imaging was performed from the skull base to thigh after the radiotracer. CT data was obtained and used for attenuation correction and anatomic localization. FASTING BLOOD GLUCOSE:  Value: 103 mg/dl COMPARISON:  Multiple exams, including 11/01/2015 FINDINGS: NECK A left station 3 lymph node measures 1.1 cm in short axis on image 25/4 (formerly 1.0 cm by my measurement) and has a maximum standard uptake value of 21.5 (formerly 91.2). Adjacent left station 3 adenopathy is also present. CHEST Mild reduction in size and more notable reduction in metabolic activity of the scattered pulmonary nodules. An index 5 mm left upper lobe nodule on image 18/8 previously measured 6 mm in diameter, and currently has a maximum SUV of 3.0 (formerly 12.5).  A right lower paratracheal lymph node has a maximum SUV of 24.7 (formerly 74.2 ABDOMEN/PELVIS Accentuated physiologic activity in bowel noted. This can lower sensitivity for polyps. Mild prominence of rectal stool, as before. Amount of activity at the somewhat patulous anus is accentuated but much less than previous. SKELETON The dominant upper thoracic metastatic lesion has a maximum SUV of 39.2 (formerly 93.5). Reduced conspicuity of the right manubrial lesion. IMPRESSION: 1. Generally reduced hypermetabolic activity in the previously identified lesions, including the pulmonary nodules, dominant upper thoracic metastatic focus, and left lower cervical adenopathy. Mildly reduced size of previous lesions as well. No new lesions. Electronically Signed   By: Van Clines M.D.   On: 06/03/2016 13:13     ASSESSMENT & PLAN:   51 year old female with   1) stage IVc metastatic Hurthle cell thyroid carcinoma with metastases to the spine, lung, superior mediastinal mass and nodal metastases. Improved based on last PET/CT  Patient has had treatment as noted above. Recently had cord compression at T3 level due to recurrent tumor status post partial resection and debulking. Recent MRI on 08/14/2015 showed improved but persistent cord compression at the T3 level. She has been noted not to be a candidate for further repeat surgery at this time or repeat radiation. Recent PET/CT scan is noted of bowel done at [facility] on 08/07/2015 showed evidence of tumor progression and patient was started on lenvatinib as per Dr. Leamon Arnt. Her thyroglobulin level was 11.7 with anti-body of 9.8 on 08/07/2015 at [place]  PET/CT 11/01/2015: shows relatively stable though high burden of disease. Some concern of local recurrence in the thyroidectomy bed which is asymptomatic at this time. MRI thoracic spine 12/25/2015: No new abnormality since the prior examination. Status post T1-5 fusion for T3 decompression. Mass effect on the cord at the  T3 level appears improved compared to the prior exam. Postoperative fluid collection likely representing a seroma seen on the prior study has slightly decreased in size.  PET/CT 06/03/2016 with Generally reduced hypermetabolic activity in the previously identified lesions,  including the pulmonary nodules, dominant upper thoracic metastatic focus, and left lower cervical adenopathy. Mildly reduced size of previous lesions as well. No new lesions.  2) grade 1 oral mucositis related to Lenvatinib -resolved  3) FDG avidity in the rectum - much improved. No significant concerns on last PET  4) abnormal LFTs with borderline elevation of transaminases and increase in alkaline phosphatase. Mild transaminitis could be from her TKI. PET/ CT did not show any liver involvement or biliary obstruction. Elevation in alkaline phosphatase could be from additional bone involvement. Repeat labs today shows stable LFTs  5) Mild Anemia and minimal Neutropenia -- likely from TKI -- monitor Plan - PET/CT from 06/03/2016 shows disease response to current treatment. [name] quite glad about this in clinically notes that her leg weakness is improved and that she is transferring easier from her wheelchair to the bed and car. -Continue physical therapy. - she appears to have clinical benefit we'll continue Lenvatinib at the current dose of '18mg'$  po daily. -We shall repeat her labs in 2 months to monitor her anemia and neutropenia from her TKI -pending thyroglobulin level and thyroglobulin antibody levels from today --Her TSH appears well suppressed- she will be following with Dr. Chalmers Cater from endocrinology for this. -follow-up with Dr. Cyndy Freeze as per his recommendations.  RTC with [name] in 8 weeks with labs   5) history of right-sided pulmonary embolism on chronic anticoagulation with Rivaroxaban. Monitor for bleeding. No bleeding reported  6)  HTN. -continue f/u with PCP   All of the patients questions were  answered with apparent satisfaction. The patient knows to call the clinic with any problems, questions or concerns.  I spent 25 minutes counseling the patient face to face. The total time spent in the appointment was 30 minutes and more than 50% was on counseling and direct patient cares.    Sullivan Lone MD [place] AAHIVMS [facility] Hematology/Oncology Physician [facility]  (Office):       [phone] (Work cell):  [phone] (Fax):           [phone]

## 2016-06-17 NOTE — Telephone Encounter (Signed)
Gave patient avs report and appointments for May  °

## 2016-06-18 ENCOUNTER — Telehealth: Payer: Self-pay | Admitting: Pharmacist

## 2016-06-18 DIAGNOSIS — C73 Malignant neoplasm of thyroid gland: Secondary | ICD-10-CM

## 2016-06-18 DIAGNOSIS — G8929 Other chronic pain: Secondary | ICD-10-CM | POA: Diagnosis not present

## 2016-06-18 DIAGNOSIS — M25511 Pain in right shoulder: Secondary | ICD-10-CM | POA: Diagnosis not present

## 2016-06-18 DIAGNOSIS — M25512 Pain in left shoulder: Secondary | ICD-10-CM | POA: Diagnosis not present

## 2016-06-18 LAB — T3 UPTAKE
Free Thyroxine Index: 5.3 — ABNORMAL HIGH (ref 1.2–4.9)
T3 UPTAKE RATIO: 33 % (ref 24–39)

## 2016-06-18 LAB — T4: Thyroxine (T4): 16 ug/dL — ABNORMAL HIGH (ref 4.5–12.0)

## 2016-06-18 LAB — T4, FREE: T4,Free(Direct): 2.68 ng/dL — ABNORMAL HIGH (ref 0.82–1.77)

## 2016-06-18 LAB — THYROGLOBULIN ANTIBODY: THYROGLOBULIN ANTIBODY: 2 [IU]/mL — AB (ref 0.0–0.9)

## 2016-06-18 MED ORDER — LENVATINIB (18 MG DAILY DOSE) 10 MG & 2 X 4 MG PO CPPK: 18.0000 mg | ORAL_CAPSULE | Freq: Every day | ORAL | 3 refills | Status: DC

## 2016-06-18 NOTE — Telephone Encounter (Signed)
Oral Chemotherapy Pharmacist Encounter  Received notification from MD that patient has changed prescription insurance providers and had some questions about where she can get her Lenvima refilled. New insurance [employer], [employer], will e-scribe to [facility] to start benefits investigation.  Oral Oncology Clinic will continue to follow.  Johny Drilling, PharmD, BCPS, BCOP 06/18/2016  8:00 AM Oral Oncology Clinic [phone]

## 2016-06-18 NOTE — Telephone Encounter (Signed)
Pt called about her refill of lenvima. Explained to her that with insurance change we will have to resubmit for approval again. She will call back tomorrow.

## 2016-06-22 LAB — THYROGLOBULIN LEVEL: Thyroglobulin (TG-RIA): 33 ng/mL

## 2016-06-25 NOTE — Telephone Encounter (Signed)
Oral Chemotherapy Pharmacist Encounter  Received notification from [facility] that [facility] copayment $8.35  The pharmacy has reached out to the patient with copayment information. Per [facility], patient stated she is obtaining a new credit card for the copayment and once this is obtained she will get back in touch with the pharmacy to fill medication. Once copayment is paid, [facility] is agreeable to mail medication directly to patient's home so she does not have to come to [name] to pick it up.  I called patient to check to check on status of credit card and LVM for patient. I instructed patient to call [facility] once new CC is obtained to have them fill medication.  Oral Oncology Clinic will continue to follow.  Johny Drilling, PharmD, BCPS, BCOP 06/25/2016  3:49 PM Oral Oncology Clinic [phone]

## 2016-06-30 MED FILL — LENVIMA 18 MG DAILY DOSE: 10 MG & | 30 days supply | Qty: 90 | Fill #0

## 2016-07-01 ENCOUNTER — Telehealth: Payer: Self-pay

## 2016-07-01 DIAGNOSIS — G8220 Paraplegia, unspecified: Secondary | ICD-10-CM

## 2016-07-01 DIAGNOSIS — C73 Malignant neoplasm of thyroid gland: Secondary | ICD-10-CM

## 2016-07-01 DIAGNOSIS — M75102 Unspecified rotator cuff tear or rupture of left shoulder, not specified as traumatic: Secondary | ICD-10-CM

## 2016-07-01 DIAGNOSIS — M75101 Unspecified rotator cuff tear or rupture of right shoulder, not specified as traumatic: Secondary | ICD-10-CM

## 2016-07-01 DIAGNOSIS — I10 Essential (primary) hypertension: Secondary | ICD-10-CM

## 2016-07-01 DIAGNOSIS — M10071 Idiopathic gout, right ankle and foot: Secondary | ICD-10-CM

## 2016-07-01 MED ORDER — COLCHICINE 0.6 MG PO TABS: 0.6000 mg | ORAL_TABLET | Freq: Two times a day (BID) | ORAL | 3 refills | Status: DC | PRN

## 2016-07-01 NOTE — Telephone Encounter (Signed)
She has gotten it taken care of with insurance.

## 2016-07-01 NOTE — Telephone Encounter (Signed)
Was it a refill issue? I sent in refill to pharmacy.  It was given in November with 3 refills so may just need refill.

## 2016-07-01 NOTE — Telephone Encounter (Signed)
Sarah [name] called stating was having an issue with her colchicine prescription, returned call for more information, left voicemail to call back

## 2016-07-03 ENCOUNTER — Ambulatory Visit: Payer: Medicare Other | Attending: Registered Nurse | Admitting: Physical Therapy

## 2016-07-03 DIAGNOSIS — R2689 Other abnormalities of gait and mobility: Secondary | ICD-10-CM | POA: Diagnosis not present

## 2016-07-03 DIAGNOSIS — G8222 Paraplegia, incomplete: Secondary | ICD-10-CM | POA: Diagnosis not present

## 2016-07-03 DIAGNOSIS — M6281 Muscle weakness (generalized): Secondary | ICD-10-CM | POA: Diagnosis not present

## 2016-07-04 NOTE — Therapy (Signed)
[facility] [address] [place], [place], [address] Phone: [phone]   Fax:  [phone]  Physical Therapy Evaluation  Patient Details  Name: Sarah [name] MRN: [id number] Date of Birth: [birth year] Referring Provider: Danella Sensing, NP  Encounter Date: 07/03/2016      PT End of Session - 07/04/16 1347    Visit Number 1   Date for PT Re-Evaluation 09/01/16   Authorization Type UHC Medicare   Authorization Time Period 07-03-16 - 09-01-16   PT Start Time 0851   PT Stop Time 0932   PT Time Calculation (min) 41 min      Past Medical History:  Diagnosis Date  . 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 Chronic back pain   . DDD (degenerative disc disease), cervical   . DJD (degenerative joint disease)   . History of rectal fissure   . HIT (heparin-induced thrombocytopenia) ([place])   . Hypertension   . Obesity   . Pulmonary embolus, right ([place]) 2015  . Rotator cuff tendonitis right    Past Surgical History:  Procedure Laterality Date  . LAMINECTOMY N/A 12/14/2013   Procedure: THORACIC LAMINECTOMY FOR TUMOR THORACIC THREE;  Surgeon: Ashok Pall, MD;  Location: [place] [facility];  Service: Neurosurgery;  Laterality: N/A;  THORACIC LAMINECTOMY FOR TUMOR THORACIC THREE  . LAMINECTOMY N/A 07/05/2015   Procedure: THORACIC LAMINECTOMY FOR TUMOR;  Surgeon: Ashok Pall, MD;  Location: [place] [facility];  Service: Neurosurgery;  Laterality: N/A;  . POSTERIOR LUMBAR FUSION 4 LEVEL N/A 12/30/2013   Procedure: Thoracic one-Thoracic five posterior thoracic fusion with pedicle screws;  Surgeon: Ashok Pall, MD;  Location: [place];  Service: Neurosurgery;  Laterality: N/A;  Thoracic one-Thoracic five posterior thoracic fusion with pedicle screws  . THYROIDECTOMY N/A 01/09/2014   Procedure: TOTAL THYROIDECTOMY;  Surgeon: Izora Gala, MD;  Location: [facility];  Service: ENT;  Laterality: N/A;  .  TONSILLECTOMY      There were no vitals filed for this visit.       Subjective Assessment - 07/04/16 1331    Subjective Pt using manual wheelchair; performing car transfers with sliding board with supervision per pt report; pt states "I want to walk"   [name] accompained by: Family member   Pertinent History thoracic laminectomy for spinal tumor 12-14-13; 07-05-15 for 2nd sx due to tumor regrowth:  DDD; Rt RTC tendonitis; DDD and DJD:  chronic back pain   Patient Stated Goals get legs stronger and to walk   Currently in Pain? 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  No     Home Environment   Living Environment Private residence   Type of [place] One level     Prior Function   Level of Independence Independent with basic ADLs;Independent with  homemaking with ambulation   Vocation On disability   Leisure enjoys going to beach     ROM / Strength   AROM / PROM / Strength Strength     PROM   Overall PROM  Within functional limits for tasks performed  No AROM due to paraplegia - T4 spinal cord level     Strength   Strength Assessment Site Hip;Knee;Ankle   Right/Left Hip Right;Left   Right Hip Flexion 3+/5   Left Hip Flexion 2+/5   Right/Left Knee Right;Left   Right Knee Flexion 3+/5   Right Knee Extension 4/5   Left Knee Flexion 3/5   Left Knee Extension 4-/5   Right/Left Ankle Right;Left   Right Ankle Dorsiflexion 3+/5   Right Ankle Plantar Flexion 2/5   Left Ankle  Dorsiflexion 3+/5   Left Ankle Plantar Flexion 2/5     Transfers   Transfers Lateral/Scoot Transfers   Number of Reps Other reps (comment)  1   Comments Pt transferred wheelchair to mat without board;  used board for mat to wheelchair due to uphill transfer with getting back in wheelchair      Ambulation/Gait   Ambulation/Gait No   Pre-Gait Activities Pt stood inside // bars with +2 mod assist ; 2 reps ; assistance to block knees to prevent buckling     Balance   Balance Assessed Yes     Static Sitting Balance   Static Sitting - Balance Support No upper extremity supported;Feet supported   Static Sitting - Level of Assistance 7: Independent                             PT Short Term Goals - 07/04/16 1357      PT SHORT TERM GOAL #1   Title Pt. will transfer without sliding board from mat to wheelchair with SBA.                08-02-16 TARGET DATE   Time 4   Period Weeks   Status New     PT SHORT TERM GOAL #2   Title Pt will perform sit to stand from wheelchair to // bars with +1 min assist.       08-02-16   Time 4   Period Weeks   Status New     PT SHORT TERM GOAL #3   Title Independent in HEP for LE strengthening and stretching.                        08-02-16   Time 4   Period Weeks   Status New     PT SHORT TERM GOAL #4   Title Pt will stand inside // bars for 3" with bil. UE support with +1 min assist.   08-02-16   Time 4   Period Weeks   Status New     PT SHORT TERM GOAL #5   Title Incr. endurance/activity tolerance so pt. able to perform 10' nonstop on recumbent bike (Scifit)               08-02-16   Time 4   Period Weeks   Status New     Additional Short Term Goals   Additional Short Term Goals Yes     PT SHORT TERM GOAL #6   Title Obtain consult for bil. KAFO's for LE's.   08-02-16   Time 4   Period Weeks  Status New           PT Long Term Goals - 07/04/16 1403      PT LONG TERM GOAL #1   Title Transfer wheelchair to/from  mat with SBA without sliding board modified independently    09-01-16 TARGET DATE   Time 8   Period Weeks   Status New     PT LONG TERM GOAL #2   Title Stand at countertop (or at trampoline at home) modified independently for at least 5" per pt report to demo increased LE strength and improved standing tolerance     PT LONG TERM GOAL #4   Title Assess gait with orthoses if obtained.          09-01-16 TARGET DATE   Time 8   Period Weeks   Status New     PT LONG TERM GOAL #5   Title Pt will tolerate rolling prone and maintaining position for at least 10 mins for stretching hip flexors.   09-01-16 TARGET DATE   Time 8   Period Weeks   Status New               Plan - 07/04/16 1349    Clinical Impression Statement Pt is a 51 yr old lady s/p thoracic laminectomy for metastatic tumor T3 with paraplegia/paraparesis as pt is now getting return.  Pt was hospitalized from 07-05-15 - 07-11-15. Pt was previously seen at this facility for OP PT from Jan 2016 til April 2017 and again on 12-17-16 only due to insurance issues.  Pt had 2nd sx on 07-05-15 due to tumor regrowth.  Pt is now using manual wheelchair (also has a power wheelchair) and has regained some strength in her bil. LE's.  PMH includes HTN, DDD, DJD, s/p laminectomy x 2 for T3 thoracic tumor and posterior lumbar fusion 4 levels on 01-09-14.  Pt is nonambulatory but is able to stand with mod assist with UE weight bearing.  Moderate decision making required in POC.                                                                                                                                   Rehab Potential Good   Clinical Impairments Affecting Rehab Potential severity of impairments and length of time since onset   PT Frequency 2x / week   PT Duration 8 weeks   PT Treatment/Interventions ADLs/Self Care Home Management;Therapeutic exercise;Balance training;Therapeutic activities;Functional mobility training;Gait training;Neuromuscular  re-education;Patient/family education;DME Instruction;Wheelchair mobility training;Passive range of motion   PT Next Visit Plan standing inside // bars - standing frame for LE strengthening   PT Home Exercise Plan stretches and LE strengthening   Consulted and Agree with Plan of Care Patient      Patient will benefit from skilled therapeutic intervention in order to improve the following deficits and impairments:  Abnormal gait, Difficulty walking, Decreased balance, Decreased activity tolerance, Impaired tone, Decreased strength  Visit Diagnosis: Paraplegia, incomplete ([place]) - Plan: PT plan of care cert/re-cert  Muscle weakness (generalized) - Plan: PT plan of care cert/re-cert  Other abnormalities of gait and mobility - Plan: PT plan of care cert/re-cert      G-Codes - 62/03/55 1424    Functional Assessment Tool Used (Outpatient Only) pt is nonambulatory; able to stand with mod assist +2; using manual wheelchair for mobility   Functional Limitation Mobility: Walking and moving around   Mobility: Walking and Moving Around Current Status (H7416) At least 80 percent but less than 100 percent impaired, limited or restricted   Mobility: Walking and Moving Around Goal Status (L8453) At least 60 percent but less than 80 percent impaired, limited or restricted       Problem List Patient Active Problem List   Diagnosis Date Noted  . Bilateral rotator cuff syndrome 02/12/2016  . E. coli UTI 08/01/2015  . Constipation due to neurogenic bowel 08/01/2015  . Muscle spasticity   . 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 Spastic paraparesis   . Neurogenic bladder   . History of pulmonary embolism   . Post-operative pain   . Metastatic cancer ([place])   . Steroid-induced hyperglycemia   . Benign essential HTN   . Acute blood loss anemia   . Leukocytosis   . Depression   . Obstipation   . Thoracic spine tumor 07/06/2015  . 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 Cord  compression ([place])   . Encounter for intubation   . Lower extremity weakness   . Respiratory failure ([place])   . Postoperative hypothyroidism   . Peripheral edema 06/07/2015  . Hypothyroidism, postop 03/08/2015  . Secondary malignant neoplasm of vertebral column ([place]) 03/08/2015  . 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 Dysuria 09/04/2014  . Hypertension 05/01/2014  . Ingrown right big toenail 05/01/2014  . Hypokalemia 05/01/2014  . GERD (gastroesophageal reflux disease) 02/14/2014  . Dysphagia, pharyngoesophageal phase 02/06/2014  . Type 2 diabetes mellitus without complication ([place]) 64/68/0321  . Constipation   . Neurogenic bowel   . 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 ([place]) 01/20/2014  . Metastatic cancer to spine ([place]) 01/19/2014  . Rotator cuff tendonitis   . Acute postoperative respiratory failure ([place]) 01/09/2014  . 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 Leg weakness, bilateral 12/13/2013    [place], PT 07/04/2016, 2:27 PM  [facility] [address] [place], [place], [address] Phone: [phone]   Fax:  [phone]  Name: TATUMN CORBRIDGE MRN: [id number] Date of Birth: 02-Sep-1965

## 2016-07-07 ENCOUNTER — Ambulatory Visit: Payer: Medicare Other | Admitting: Physical Therapy

## 2016-07-09 DIAGNOSIS — C73 Malignant neoplasm of thyroid gland: Secondary | ICD-10-CM | POA: Diagnosis not present

## 2016-07-09 DIAGNOSIS — G8220 Paraplegia, unspecified: Secondary | ICD-10-CM | POA: Diagnosis not present

## 2016-07-09 DIAGNOSIS — G839 Paralytic syndrome, unspecified: Secondary | ICD-10-CM | POA: Diagnosis not present

## 2016-07-10 ENCOUNTER — Ambulatory Visit: Payer: Medicare Other | Admitting: Physical Therapy

## 2016-07-10 DIAGNOSIS — M6281 Muscle weakness (generalized): Secondary | ICD-10-CM

## 2016-07-10 DIAGNOSIS — R2689 Other abnormalities of gait and mobility: Secondary | ICD-10-CM

## 2016-07-10 DIAGNOSIS — G8222 Paraplegia, incomplete: Secondary | ICD-10-CM | POA: Diagnosis not present

## 2016-07-11 ENCOUNTER — Other Ambulatory Visit: Payer: Self-pay | Admitting: Family Medicine

## 2016-07-11 NOTE — Therapy (Signed)
[facility] [address] [place], [place], [address] Phone: [phone]   Fax:  [phone]  Physical Therapy Treatment  Patient Details  Name: Sarah [name] MRN: [id number] Date of Birth: [birth year] Referring Provider: Danella Sensing, NP  Encounter Date: 07/10/2016      PT End of Session - 07/11/16 1035    Visit Number 2   Date for PT Re-Evaluation 09/01/16   Authorization Type UHC Medicare   Authorization Time Period 07-03-16 - 09-01-16   PT Start Time 1155   PT Stop Time 1238   PT Time Calculation (min) 43 min      Past Medical History:  Diagnosis Date  . 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 Chronic back pain   . DDD (degenerative disc disease), cervical   . DJD (degenerative joint disease)   . History of rectal fissure   . HIT (heparin-induced thrombocytopenia) ([place])   . Hypertension   . Obesity   . Pulmonary embolus, right ([place]) 2015  . Rotator cuff tendonitis right    Past Surgical History:  Procedure Laterality Date  . LAMINECTOMY N/A 12/14/2013   Procedure: THORACIC LAMINECTOMY FOR TUMOR THORACIC THREE;  Surgeon: Ashok Pall, MD;  Location: [facility];  Service: Neurosurgery;  Laterality: N/A;  THORACIC LAMINECTOMY FOR TUMOR THORACIC THREE  . LAMINECTOMY N/A 07/05/2015   Procedure: THORACIC LAMINECTOMY FOR TUMOR;  Surgeon: Ashok Pall, MD;  Location: [facility];  Service: Neurosurgery;  Laterality: N/A;  . POSTERIOR LUMBAR FUSION 4 LEVEL N/A 12/30/2013   Procedure: Thoracic one-Thoracic five posterior thoracic fusion with pedicle screws;  Surgeon: Ashok Pall, MD;  Location: [facility];  Service: Neurosurgery;  Laterality: N/A;  Thoracic one-Thoracic five posterior thoracic fusion with pedicle screws  . THYROIDECTOMY N/A 01/09/2014   Procedure: TOTAL THYROIDECTOMY;  Surgeon: Izora Gala, MD;  Location: [facility];  Service: ENT;  Laterality: N/A;  .  TONSILLECTOMY      There were no vitals filed for this visit.      Subjective Assessment - 07/11/16 1034    Subjective Pt reports she wants to do some stretches on mat today   [name] accompained by: Family member   Pertinent History thoracic laminectomy for spinal tumor 12-14-13; 07-05-15 for 2nd sx due to tumor regrowth:  DDD; Rt RTC tendonitis; DDD and DJD:  chronic back pain   Patient Stated Goals get legs stronger and to walk   Currently in Pain? No/denies      TherEx:  Pt performed bridging x 10 reps; knee to chest x 10 reps with minimal resistance; Lt and Rt hip flexion with knee flexed 10 reps; passive Lt hip flexor stretch with pt in Rt sidelying position  Bil. Knee to chest 30 sec hold for low back stretching Pt performed Lt hip abduction with external rotation (clam shell) x 10 reps Bil. Knee flexion 10 reps each in prone position  TherAct: pt rolled prone from supine with mod to max assist to move RUE from underneath her to get in comfortable position while in prone position  Pt perform press ups on both forearms - maintaining position for stretching as long as able to tolerate - approx. 1" x 3 reps  Pt able to roll from prone to Rt sidelying to supine with SBA                 [facility] Adult PT Treatment/Exercise - 07/11/16 0001  Transfers   Transfers Lateral/Scoot Transfers   Number of Reps Other reps (comment)  2   Comments Pt transferred wheelchair to mat without board;  used board for mat to wheelchair due to uphill transfer with getting back in wheelchair      Ambulation/Gait   Pre-Gait Activities Pt stood inside // bars with +1 mod assist - stood for approx. 15 secs x 1 rep                  PT Short Term Goals - 07/04/16 1357      PT SHORT TERM GOAL #1   Title Pt. will transfer without sliding board from mat to wheelchair with SBA.                08-02-16 TARGET DATE   Time 4   Period Weeks   Status New     PT SHORT TERM GOAL  #2   Title Pt will perform sit to stand from wheelchair to // bars with +1 min assist.       08-02-16   Time 4   Period Weeks   Status New     PT SHORT TERM GOAL #3   Title Independent in HEP for LE strengthening and stretching.                        08-02-16   Time 4   Period Weeks   Status New     PT SHORT TERM GOAL #4   Title Pt will stand inside // bars for 3" with bil. UE support with +1 min assist.   08-02-16   Time 4   Period Weeks   Status New     PT SHORT TERM GOAL #5   Title Incr. endurance/activity tolerance so pt. able to perform 10' nonstop on recumbent bike (Scifit)               08-02-16   Time 4   Period Weeks   Status New     Additional Short Term Goals   Additional Short Term Goals Yes     PT SHORT TERM GOAL #6   Title Obtain consult for bil. KAFO's for LE's.   08-02-16   Time 4   Period Weeks   Status New           PT Long Term Goals - 07/04/16 1403      PT LONG TERM GOAL #1   Title Transfer wheelchair to/from mat with SBA without sliding board modified independently    09-01-16 TARGET DATE   Time 8   Period Weeks   Status New     PT LONG TERM GOAL #2   Title Stand at countertop (or at trampoline at home) modified independently for at least 5" per pt report to demo increased LE strength and improved standing tolerance     PT LONG TERM GOAL #4   Title Assess gait with orthoses if obtained.          09-01-16 TARGET DATE   Time 8   Period Weeks   Status New     PT LONG TERM GOAL #5   Title Pt will tolerate rolling prone and maintaining position for at least 10 mins for stretching hip flexors.   09-01-16 TARGET DATE   Time 8   Period Weeks   Status New               Plan - 07/11/16 1036  Clinical Impression Statement Pt stood in // bars with +1 mod assist for 1st time (has previously required 2 people due to incr. difficulty with standing);  pt needs cues to tuck hips for incr. trunk extension   Rehab Potential Good   Clinical  Impairments Affecting Rehab Potential severity of impairments and length of time since onset   PT Frequency 2x / week   PT Duration 8 weeks   PT Treatment/Interventions ADLs/Self Care Home Management;Therapeutic exercise;Balance training;Therapeutic activities;Functional mobility training;Gait training;Neuromuscular re-education;Patient/family education;DME Instruction;Wheelchair mobility training;Passive range of motion   PT Next Visit Plan standing inside // bars - standing frame for LE strengthening   PT Home Exercise Plan stretches and LE strengthening   Consulted and Agree with Plan of Care Patient      Patient will benefit from skilled therapeutic intervention in order to improve the following deficits and impairments:  Abnormal gait, Difficulty walking, Decreased balance, Decreased activity tolerance, Impaired tone, Decreased strength  Visit Diagnosis: Muscle weakness (generalized)  Other abnormalities of gait and mobility     Problem List Patient Active Problem List   Diagnosis Date Noted  . Bilateral rotator cuff syndrome 02/12/2016  . E. coli UTI 08/01/2015  . Constipation due to neurogenic bowel 08/01/2015  . Muscle spasticity   . 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 Spastic paraparesis   . Neurogenic bladder   . History of pulmonary embolism   . Post-operative pain   . Metastatic cancer ([name])   . Steroid-induced hyperglycemia   . Benign essential HTN   . Acute blood loss anemia   . Leukocytosis   . Depression   . Obstipation   . Thoracic spine tumor 07/06/2015  . 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 Cord compression ([place])   . Encounter for intubation   . Lower extremity weakness   . Respiratory failure ([place])   . Postoperative hypothyroidism   . Peripheral edema 06/07/2015  . Hypothyroidism, postop 03/08/2015  . Secondary malignant neoplasm of vertebral column ([place]) 03/08/2015  . 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 Dysuria 09/04/2014   . Hypertension 05/01/2014  . Ingrown right big toenail 05/01/2014  . Hypokalemia 05/01/2014  . GERD (gastroesophageal reflux disease) 02/14/2014  . Dysphagia, pharyngoesophageal phase 02/06/2014  . Type 2 diabetes mellitus without complication ([place]) 16/12/9602  . Constipation   . Neurogenic bowel   . 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 ([place]) 01/20/2014  . Metastatic cancer to spine ([place]) 01/19/2014  . Rotator cuff tendonitis   . Acute postoperative respiratory failure ([place]) 01/09/2014  . 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 Leg weakness, bilateral 12/13/2013    [email or web address], XBJYN WGNFAOZ, PT 07/11/2016, 10:39 AM  [facility] [address] [place], [place], [address] Phone: [phone]   Fax:  [phone]  Name: Sarah [name] MRN: [id number] Date of Birth: 05-Jul-1965

## 2016-07-15 ENCOUNTER — Telehealth: Payer: Self-pay | Admitting: Physical Medicine & Rehabilitation

## 2016-07-15 ENCOUNTER — Ambulatory Visit: Payer: Medicare Other | Admitting: Physical Therapy

## 2016-07-15 NOTE — Telephone Encounter (Signed)
Patient upset she received a bill for [facility] from [facility] - explained split bill site and also bcbs error on claim submission - asked her to have claim reprocessed.

## 2016-07-16 ENCOUNTER — Ambulatory Visit: Payer: Medicare Other | Admitting: Physical Therapy

## 2016-07-17 ENCOUNTER — Ambulatory Visit: Payer: Medicare Other | Admitting: Physical Therapy

## 2016-07-17 DIAGNOSIS — M6281 Muscle weakness (generalized): Secondary | ICD-10-CM | POA: Diagnosis not present

## 2016-07-17 DIAGNOSIS — G8222 Paraplegia, incomplete: Secondary | ICD-10-CM | POA: Diagnosis not present

## 2016-07-17 DIAGNOSIS — R2689 Other abnormalities of gait and mobility: Secondary | ICD-10-CM

## 2016-07-18 NOTE — Therapy (Signed)
[facility] [address] [place], [place], [address] Phone: [phone]   Fax:  [phone]  Physical Therapy Treatment  Patient Details  Name: Sarah [name] MRN: [id number] Date of Birth: December 11, 1965 Referring Provider: Danella Sensing, NP  Encounter Date: 07/17/2016      PT End of Session - 07/18/16 1132    Visit Number 3   Date for PT Re-Evaluation 09/01/16   Authorization Type UHC Medicare   Authorization Time Period 07-03-16 - 09-01-16   PT Start Time 0851   PT Stop Time 0949   PT Time Calculation (min) 58 min   Equipment Utilized During Treatment Gait belt      Past Medical History:  Diagnosis Date  . 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 Chronic back pain   . DDD (degenerative disc disease), cervical   . DJD (degenerative joint disease)   . History of rectal fissure   . HIT (heparin-induced thrombocytopenia) ([place])   . Hypertension   . Obesity   . Pulmonary embolus, right ([place]) 2015  . Rotator cuff tendonitis right    Past Surgical History:  Procedure Laterality Date  . LAMINECTOMY N/A 12/14/2013   Procedure: THORACIC LAMINECTOMY FOR TUMOR THORACIC THREE;  Surgeon: Ashok Pall, MD;  Location: [place] [facility];  Service: Neurosurgery;  Laterality: N/A;  THORACIC LAMINECTOMY FOR TUMOR THORACIC THREE  . LAMINECTOMY N/A 07/05/2015   Procedure: THORACIC LAMINECTOMY FOR TUMOR;  Surgeon: Ashok Pall, MD;  Location: [place] [facility];  Service: Neurosurgery;  Laterality: N/A;  . POSTERIOR LUMBAR FUSION 4 LEVEL N/A 12/30/2013   Procedure: Thoracic one-Thoracic five posterior thoracic fusion with pedicle screws;  Surgeon: Ashok Pall, MD;  Location: [place] [facility]UR[facility];  Service: Neurosurgery;  Laterality: N/A;  Thoracic one-Thoracic five posterior thoracic fusion with pedicle screws  . THYROIDECTOMY N/A 01/09/2014   Procedure: TOTAL THYROIDECTOMY;  Surgeon: Izora Gala, MD;  Location: [place];  Service: ENT;  Laterality: N/A;  . TONSILLECTOMY      There were no vitals filed for this visit.      Subjective Assessment - 07/18/16 1126    Subjective Pt reports she has been trying to stand some at home - at trampoline when not raining   [name] accompained by: Family member   Pertinent History thoracic laminectomy for spinal tumor 12-14-13; 07-05-15 for 2nd sx due to tumor regrowth:  DDD; Rt RTC tendonitis; DDD and DJD:  chronic back pain   Patient Stated Goals get legs stronger and to walk   Currently in Pain? No/denies                         [facility] Adult PT Treatment/Exercise - 07/18/16 0001      Bed Mobility   Bed Mobility Rolling Right   Rolling Right 5: Supervision     Transfers   Transfers Lateral/Scoot Transfers   Number of Reps Other reps (comment)  2   Comments Pt transferred wheelchair to mat without board;  used board for mat to wheelchair due to uphill transfer with getting back in wheelchair      Ambulation/Gait   Ambulation/Gait Yes   Ambulation/Gait Assistance 3: Mod assist  each knee blocked with PT's knee to prevent buckling   Ambulation Distance (Feet) 3 Feet   Assistive device Parallel bars   Pre-Gait Activities Pt stood inside // bars with mod to min assist  Exercises   Exercises Lumbar;Knee/Hip     Lumbar Exercises: Stretches   Lower Trunk Rotation 1 rep;30 seconds  each side   Prone on Elbows Stretch 2 reps;10 seconds     Lumbar Exercises: Supine   Heel Slides 10 reps   Bridge 10 reps  5 reps with ball between knees     Knee/Hip Exercises: Aerobic   Stationary Bike SciFit level 2.0 with UE's intermittently and LE's x 5"     Knee/Hip Exercises: Prone   Hamstring Curl 10 reps  No weight on LLE:  2# RLE 10 reps                  PT Short Term Goals - 07/04/16 1357      PT SHORT TERM GOAL #1   Title Pt. will transfer without sliding board from mat to wheelchair with SBA.                08-02-16  TARGET DATE   Time 4   Period Weeks   Status New     PT SHORT TERM GOAL #2   Title Pt will perform sit to stand from wheelchair to // bars with +1 min assist.       08-02-16   Time 4   Period Weeks   Status New     PT SHORT TERM GOAL #3   Title Independent in HEP for LE strengthening and stretching.                        08-02-16   Time 4   Period Weeks   Status New     PT SHORT TERM GOAL #4   Title Pt will stand inside // bars for 3" with bil. UE support with +1 min assist.   08-02-16   Time 4   Period Weeks   Status New     PT SHORT TERM GOAL #5   Title Incr. endurance/activity tolerance so pt. able to perform 10' nonstop on recumbent bike (Scifit)               08-02-16   Time 4   Period Weeks   Status New     Additional Short Term Goals   Additional Short Term Goals Yes     PT SHORT TERM GOAL #6   Title Obtain consult for bil. KAFO's for LE's.   08-02-16   Time 4   Period Weeks   Status New           PT Long Term Goals - 07/04/16 1403      PT LONG TERM GOAL #1   Title Transfer wheelchair to/from mat with SBA without sliding board modified independently    09-01-16 TARGET DATE   Time 8   Period Weeks   Status New     PT LONG TERM GOAL #2   Title Stand at countertop (or at trampoline at home) modified independently for at least 5" per pt report to demo increased LE strength and improved standing tolerance     PT LONG TERM GOAL #4   Title Assess gait with orthoses if obtained.          09-01-16 TARGET DATE   Time 8   Period Weeks   Status New     PT LONG TERM GOAL #5   Title Pt will tolerate rolling prone and maintaining position for at least 10 mins for stretching hip flexors.   09-01-16 TARGET  DATE   Time 8   Period Weeks   Status New               Plan - 07/18/16 1135    Clinical Impression Statement Pt took 3-4 steps with each leg for first time today inside // bars.  Pt's knees block by therapist's knees to prevent buckling but no buckling  occurred.  Pt fatigued easily; excessive weight bearing on UE's on // bars.   Rehab Potential Good   Clinical Impairments Affecting Rehab Potential severity of impairments and length of time since onset   PT Frequency 2x / week   PT Duration 8 weeks   PT Treatment/Interventions ADLs/Self Care Home Management;Therapeutic exercise;Balance training;Therapeutic activities;Functional mobility training;Gait training;Neuromuscular re-education;Patient/family education;DME Instruction;Wheelchair mobility training;Passive range of motion   PT Next Visit Plan standing inside // bars - gait training as tolerated   PT Home Exercise Plan stretches and LE strengthening   Consulted and Agree with Plan of Care Patient      Patient will benefit from skilled therapeutic intervention in order to improve the following deficits and impairments:  Abnormal gait, Difficulty walking, Decreased balance, Decreased activity tolerance, Impaired tone, Decreased strength  Visit Diagnosis: Other abnormalities of gait and mobility  Muscle weakness (generalized)     Problem List Patient Active Problem List   Diagnosis Date Noted  . Bilateral rotator cuff syndrome 02/12/2016  . E. coli UTI 08/01/2015  . Constipation due to neurogenic bowel 08/01/2015  . Muscle spasticity   . 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 Spastic paraparesis   . Neurogenic bladder   . History of pulmonary embolism   . Post-operative pain   . Metastatic cancer ([place])   . Steroid-induced hyperglycemia   . Benign essential HTN   . Acute blood loss anemia   . Leukocytosis   . Depression   . Obstipation   . Thoracic spine tumor 07/06/2015  . 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 Cord compression ([place])   . Encounter for intubation   . Lower extremity weakness   . Respiratory failure ([place])   . Postoperative hypothyroidism   . Peripheral edema 06/07/2015  . Hypothyroidism, postop 03/08/2015  . Secondary malignant  neoplasm of vertebral column ([place]) 03/08/2015  . 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 Dysuria 09/04/2014  . Hypertension 05/01/2014  . Ingrown right big toenail 05/01/2014  . Hypokalemia 05/01/2014  . GERD (gastroesophageal reflux disease) 02/14/2014  . Dysphagia, pharyngoesophageal phase 02/06/2014  . Type 2 diabetes mellitus without complication ([place]) 76/19/5093  . Constipation   . Neurogenic bowel   . 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 ([place]) 01/20/2014  . 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 Rotator cuff tendonitis   . Acute postoperative respiratory failure ([place]) 01/09/2014  . 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 Leg weakness, bilateral 12/13/2013    Alda Lea, PT 07/18/2016, 11:40 AM  [facility] [address] [place], [place], [address] Phone: [phone]   Fax:  [phone]  Name: Sarah [name] MRN: [id number] Date of Birth: [birth year]

## 2016-07-21 ENCOUNTER — Ambulatory Visit: Payer: Medicare Other | Admitting: Physical Therapy

## 2016-07-22 ENCOUNTER — Other Ambulatory Visit: Payer: Self-pay | Admitting: Physical Medicine & Rehabilitation

## 2016-07-22 DIAGNOSIS — G8220 Paraplegia, unspecified: Secondary | ICD-10-CM

## 2016-07-22 DIAGNOSIS — M10071 Idiopathic gout, right ankle and foot: Secondary | ICD-10-CM

## 2016-07-22 DIAGNOSIS — M75101 Unspecified rotator cuff tear or rupture of right shoulder, not specified as traumatic: Secondary | ICD-10-CM

## 2016-07-22 DIAGNOSIS — C73 Malignant neoplasm of thyroid gland: Secondary | ICD-10-CM

## 2016-07-22 DIAGNOSIS — I10 Essential (primary) hypertension: Secondary | ICD-10-CM

## 2016-07-22 DIAGNOSIS — M75102 Unspecified rotator cuff tear or rupture of left shoulder, not specified as traumatic: Secondary | ICD-10-CM

## 2016-07-24 ENCOUNTER — Ambulatory Visit: Payer: Medicare Other | Attending: Registered Nurse | Admitting: Physical Therapy

## 2016-07-24 DIAGNOSIS — R2689 Other abnormalities of gait and mobility: Secondary | ICD-10-CM | POA: Diagnosis not present

## 2016-07-24 DIAGNOSIS — M6281 Muscle weakness (generalized): Secondary | ICD-10-CM

## 2016-07-25 ENCOUNTER — Other Ambulatory Visit: Payer: Self-pay

## 2016-07-25 ENCOUNTER — Other Ambulatory Visit: Payer: Self-pay | Admitting: *Deleted

## 2016-07-25 ENCOUNTER — Telehealth: Payer: Self-pay

## 2016-07-25 DIAGNOSIS — G8220 Paraplegia, unspecified: Secondary | ICD-10-CM

## 2016-07-25 DIAGNOSIS — M75101 Unspecified rotator cuff tear or rupture of right shoulder, not specified as traumatic: Secondary | ICD-10-CM

## 2016-07-25 DIAGNOSIS — C73 Malignant neoplasm of thyroid gland: Secondary | ICD-10-CM

## 2016-07-25 DIAGNOSIS — M75102 Unspecified rotator cuff tear or rupture of left shoulder, not specified as traumatic: Secondary | ICD-10-CM

## 2016-07-25 DIAGNOSIS — I10 Essential (primary) hypertension: Secondary | ICD-10-CM

## 2016-07-25 DIAGNOSIS — M10071 Idiopathic gout, right ankle and foot: Secondary | ICD-10-CM

## 2016-07-25 MED ORDER — COLCHICINE 0.6 MG PO TABS: 0.6000 mg | ORAL_TABLET | Freq: Two times a day (BID) | ORAL | 3 refills | Status: DC | PRN

## 2016-07-25 NOTE — Therapy (Signed)
[facility] [address] [place], [place], [address] Phone: [phone]   Fax:  [phone]  Physical Therapy Treatment  Patient Details  Name: Sarah [name] MRN: [id number] Date of Birth: Jan 16, 1966 Referring Provider: Danella Sensing, NP  Encounter Date: 07/24/2016      PT End of Session - 07/25/16 1102    Visit Number 4   Date for PT Re-Evaluation 09/01/16   Authorization Type UHC Medicare   Authorization Time Period 07-03-16 - 09-01-16   PT Start Time 0845   PT Stop Time 0931   PT Time Calculation (min) 46 min   Equipment Utilized During Treatment Gait belt      Past Medical History:  Diagnosis Date  . 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 Chronic back pain   . DDD (degenerative disc disease), cervical   . DJD (degenerative joint disease)   . History of rectal fissure   . HIT (heparin-induced thrombocytopenia) ([place])   . Hypertension   . Obesity   . Pulmonary embolus, right ([place]) 2015  . Rotator cuff tendonitis right    Past Surgical History:  Procedure Laterality Date  . LAMINECTOMY N/A 12/14/2013   Procedure: THORACIC LAMINECTOMY FOR TUMOR THORACIC THREE;  Surgeon: Ashok Pall, MD;  Location: [place] [facility];  Service: Neurosurgery;  Laterality: N/A;  THORACIC LAMINECTOMY FOR TUMOR THORACIC THREE  . LAMINECTOMY N/A 07/05/2015   Procedure: THORACIC LAMINECTOMY FOR TUMOR;  Surgeon: Ashok Pall, MD;  Location: [place] [facility];  Service: Neurosurgery;  Laterality: N/A;  . POSTERIOR LUMBAR FUSION 4 LEVEL N/A 12/30/2013   Procedure: Thoracic one-Thoracic five posterior thoracic fusion with pedicle screws;  Surgeon: Ashok Pall, MD;  Location: [place] [facility];  Service: Neurosurgery;  Laterality: N/A;  Thoracic one-Thoracic five posterior thoracic fusion with pedicle screws  . THYROIDECTOMY N/A 01/09/2014   Procedure: TOTAL THYROIDECTOMY;  Surgeon: Izora Gala, MD;  Location: [place];  Service: ENT;  Laterality: N/A;  . TONSILLECTOMY      There were no vitals filed for this visit.      Subjective Assessment - 07/25/16 1059    Subjective Pt reports she was sick earlier in the week but is feeling better today; has been trying to stand from 3 in 1 commode at home but is unable to stand completely upright   Pertinent History thoracic laminectomy for spinal tumor 12-14-13; 07-05-15 for 2nd sx due to tumor regrowth:  DDD; Rt RTC tendonitis; DDD and DJD:  chronic back pain   Patient Stated Goals get legs stronger and to walk   Currently in Pain? No/denies          Self Care:  Brooke Pace, orthotist from [place], present for orthotic consult.  Discussed use of orthoses - pt does not need KAFO's due to knees not buckling in standing due to benefit of extensor spasticity.  Also discussed disadvantage of KAFO's being Heavy and difficult to advance, but not needed due to knee stability noted in standing secondary to extensor tone. Recommended use of AFO's to allow pt to achieve full foot contact as she is currently standing in plantarflexed position and has difficulty Performing hip extension with posterior pelvic tilt.  Gait:  Pt stood with +2 mod assist from wheelchair to RW (bariatric RW used):  Pt gait trained 3 steps with RW with each leg with Assistance blocking knee in case of buckling Pt amb. Almost full length of // bars with +  1 mod assist (8') for first time today; pt very anxious and fearful of falling which limits standing tolerance               [facility] Adult PT Treatment/Exercise - 07/25/16 0001      Ambulation/Gait   Ambulation/Gait Yes   Ambulation/Gait Assistance 3: Mod assist  each knee blocked with PT's knee to prevent buckling   Ambulation Distance (Feet) 8 Feet   Assistive device Parallel bars                PT Education - 07/25/16 1116    Education provided Yes   Education Details discussed need to perform standing as much as  tolerated at home   Person(s) Educated Patient   Comprehension Verbalized understanding          PT Short Term Goals - 07/25/16 1114      PT SHORT TERM GOAL #1   Title Pt. will transfer without sliding board from mat to wheelchair with SBA.                08-02-16 TARGET DATE   Baseline met 09-18-14   Time 4   Period Weeks   Status New     PT SHORT TERM GOAL #2   Title Pt will perform sit to stand from wheelchair to // bars with +1 min assist.       08-02-16   Time 4   Period Weeks   Status New     PT SHORT TERM GOAL #3   Title Independent in HEP for LE strengthening and stretching.                        08-02-16   Time 4   Period Weeks   Status New     PT SHORT TERM GOAL #4   Title Pt will stand inside // bars for 3" with bil. UE support with +1 min assist.   08-02-16   Time 4   Period Weeks   Status New     PT SHORT TERM GOAL #5   Title Incr. endurance/activity tolerance so pt. able to perform 10' nonstop on recumbent bike (Scifit)               08-02-16   Time 4   Period Weeks   Status New     PT SHORT TERM GOAL #6   Title Obtain consult for bil. KAFO's for LE's.   08-02-16;   met 07-24-16   Time 4   Status Achieved     PT SHORT TERM GOAL #7   Title Stand for 3" at counter for LE weight-bearing with +1 min assist.  (06-29-15)   Time 4   Period Weeks   Status New           PT Long Term Goals - 07/04/16 1403      PT LONG TERM GOAL #1   Title Transfer wheelchair to/from mat with SBA without sliding board modified independently    09-01-16 TARGET DATE   Time 8   Period Weeks   Status New     PT LONG TERM GOAL #2   Title Stand at countertop (or at trampoline at home) modified independently for at least 5" per pt report to demo increased LE strength and improved standing tolerance     PT LONG TERM GOAL #4   Title Assess gait with orthoses if obtained.  09-01-16 TARGET DATE   Time 8   Period Weeks   Status New     PT LONG TERM GOAL #5   Title Pt will  tolerate rolling prone and maintaining position for at least 10 mins for stretching hip flexors.   09-01-16 TARGET DATE   Time 8   Period Weeks   Status New               Plan - 07/25/16 1102    Clinical Impression Statement Pt gait trained inside // bars, ambulating 8' with mod assist; pt amb. furthest distance to date inside // bars with therapist blocking each knee in case of buckling.  No buckling occurred during this gait training; pt has moderate extensor spasticity which is beneficial in maintaining knee extension in standing.  STG #6 met - orthotic consult done   Rehab Potential Good   Clinical Impairments Affecting Rehab Potential severity of impairments and length of time since onset   PT Frequency 2x / week   PT Duration 8 weeks   PT Treatment/Interventions ADLs/Self Care Home Management;Therapeutic exercise;Balance training;Therapeutic activities;Functional mobility training;Gait training;Neuromuscular re-education;Patient/family education;DME Instruction;Wheelchair mobility training;Passive range of motion   PT Next Visit Plan gait training with RW with assist   PT Home Exercise Plan stretches and LE strengthening   Consulted and Agree with Plan of Care Patient      Patient will benefit from skilled therapeutic intervention in order to improve the following deficits and impairments:  Abnormal gait, Difficulty walking, Decreased balance, Decreased activity tolerance, Impaired tone, Decreased strength  Visit Diagnosis: Other abnormalities of gait and mobility  Muscle weakness (generalized)     Problem List Patient Active Problem List   Diagnosis Date Noted  . Bilateral rotator cuff syndrome 02/12/2016  . E. coli UTI 08/01/2015  . Constipation due to neurogenic bowel 08/01/2015  . Muscle spasticity   . 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 Spastic paraparesis   . Neurogenic bladder   . History of pulmonary embolism   . Post-operative pain   . Metastatic cancer  ([place])   . Steroid-induced hyperglycemia   . Benign essential HTN   . Acute blood loss anemia   . Leukocytosis   . Depression   . Obstipation   . Thoracic spine tumor 07/06/2015  . 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 Cord compression ([place])   . Encounter for intubation   . Lower extremity weakness   . Respiratory failure ([place])   . Postoperative hypothyroidism   . Peripheral edema 06/07/2015  . Hypothyroidism, postop 03/08/2015  . Secondary malignant neoplasm of vertebral column ([place]) 03/08/2015  . 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 Dysuria 09/04/2014  . Hypertension 05/01/2014  . Ingrown right big toenail 05/01/2014  . Hypokalemia 05/01/2014  . GERD (gastroesophageal reflux disease) 02/14/2014  . Dysphagia, pharyngoesophageal phase 02/06/2014  . Type 2 diabetes mellitus without complication ([place]) 41/74/0814  . Constipation   . Neurogenic bowel   . 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 Metastatic cancer to spine ([place]) 01/19/2014  . Rotator cuff tendonitis   . Acute postoperative respiratory failure ([place]) 01/09/2014  . 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 Leg weakness, bilateral 12/13/2013    GYJEHU, DJSHF WYOVZCH, PT 07/25/2016, 11:17 AM  [facility] [address] [place], [place], [address] Phone: [phone]   Fax:  [phone]  Name: Sarah [name] MRN: [id number] Date  of Birth: 02/02/1966

## 2016-07-25 NOTE — Telephone Encounter (Addendum)
Patient states she is currently waiting for a medication refill. She states she uses [facility]. Medication has been refilled

## 2016-07-26 ENCOUNTER — Encounter (HOSPITAL_COMMUNITY): Payer: Self-pay | Admitting: Emergency Medicine

## 2016-07-26 ENCOUNTER — Emergency Department (HOSPITAL_COMMUNITY)
Admission: EM | Admit: 2016-07-26 | Discharge: 2016-07-27 | Disposition: A | Discharge status: Home / Self Care | Payer: Medicare Other | Attending: Emergency Medicine | Admitting: Emergency Medicine

## 2016-07-26 DIAGNOSIS — Z79899 Other long term (current) drug therapy: Secondary | ICD-10-CM | POA: Insufficient documentation

## 2016-07-26 DIAGNOSIS — R1032 Left lower quadrant pain: Secondary | ICD-10-CM | POA: Insufficient documentation

## 2016-07-26 DIAGNOSIS — I10 Essential (primary) hypertension: Secondary | ICD-10-CM | POA: Diagnosis not present

## 2016-07-26 DIAGNOSIS — R1084 Generalized abdominal pain: Secondary | ICD-10-CM | POA: Diagnosis not present

## 2016-07-26 DIAGNOSIS — E119 Type 2 diabetes mellitus without complications: Secondary | ICD-10-CM | POA: Diagnosis not present

## 2016-07-26 DIAGNOSIS — Z85850 Personal history of malignant neoplasm of thyroid: Secondary | ICD-10-CM | POA: Diagnosis not present

## 2016-07-26 DIAGNOSIS — R197 Diarrhea, unspecified: Secondary | ICD-10-CM | POA: Diagnosis not present

## 2016-07-26 DIAGNOSIS — E039 Hypothyroidism, unspecified: Secondary | ICD-10-CM | POA: Insufficient documentation

## 2016-07-26 LAB — CBC WITH DIFFERENTIAL/PLATELET
Basophils Absolute: 0 10*3/uL (ref 0.0–0.1)
Basophils Relative: 0 %
EOS ABS: 0 10*3/uL (ref 0.0–0.7)
EOS PCT: 1 %
HCT: 36.3 % (ref 36.0–46.0)
Hemoglobin: 11.9 g/dL — ABNORMAL LOW (ref 12.0–15.0)
LYMPHS ABS: 1.6 10*3/uL (ref 0.7–4.0)
LYMPHS PCT: 43 %
MCH: 28.7 pg (ref 26.0–34.0)
MCHC: 32.8 g/dL (ref 30.0–36.0)
MCV: 87.5 fL (ref 78.0–100.0)
MONOS PCT: 7 %
Monocytes Absolute: 0.3 10*3/uL (ref 0.1–1.0)
Neutro Abs: 1.9 10*3/uL (ref 1.7–7.7)
Neutrophils Relative %: 49 %
PLATELETS: 174 10*3/uL (ref 150–400)
RBC: 4.15 MIL/uL (ref 3.87–5.11)
RDW: 15.1 % (ref 11.5–15.5)
WBC: 3.8 10*3/uL — AB (ref 4.0–10.5)

## 2016-07-26 LAB — LIPASE, BLOOD: LIPASE: 19 U/L (ref 11–51)

## 2016-07-26 LAB — COMPREHENSIVE METABOLIC PANEL
ALT: 35 U/L (ref 14–54)
ANION GAP: 7 (ref 5–15)
AST: 42 U/L — ABNORMAL HIGH (ref 15–41)
Albumin: 3.3 g/dL — ABNORMAL LOW (ref 3.5–5.0)
Alkaline Phosphatase: 181 U/L — ABNORMAL HIGH (ref 38–126)
BUN: 8 mg/dL (ref 6–20)
CALCIUM: 8.3 mg/dL — AB (ref 8.9–10.3)
CO2: 29 mmol/L (ref 22–32)
CREATININE: 1.3 mg/dL — AB (ref 0.44–1.00)
Chloride: 103 mmol/L (ref 101–111)
GFR, EST AFRICAN AMERICAN: 54 mL/min — AB (ref >60)
GFR, EST NON AFRICAN AMERICAN: 47 mL/min — AB (ref >60)
Glucose, Bld: 127 mg/dL — ABNORMAL HIGH (ref 65–99)
Potassium: 3.5 mmol/L (ref 3.5–5.1)
Sodium: 139 mmol/L (ref 135–145)
Total Bilirubin: 0.9 mg/dL (ref 0.3–1.2)
Total Protein: 5.7 g/dL — ABNORMAL LOW (ref 6.5–8.1)

## 2016-07-26 MED ORDER — HYDROMORPHONE HCL 1 MG/ML IJ SOLN
0.5000 mg | Freq: Once | INTRAMUSCULAR | Status: DC
Start: 2016-07-26 — End: 2016-07-26

## 2016-07-26 MED ORDER — SODIUM CHLORIDE 0.9 % IV BOLUS (SEPSIS)
1000.0000 mL | Freq: Once | INTRAVENOUS | Status: AC
  Administered 2016-07-26: 1000 mL via INTRAVENOUS

## 2016-07-26 MED ORDER — ONDANSETRON HCL 4 MG/2ML IJ SOLN
4.0000 mg | Freq: Once | INTRAMUSCULAR | Status: AC
  Administered 2016-07-26: 4 mg via INTRAVENOUS
  Filled 2016-07-26: qty 2

## 2016-07-26 MED ORDER — FENTANYL CITRATE (PF) 100 MCG/2ML IJ SOLN
25.0000 ug | Freq: Once | INTRAMUSCULAR | Status: DC
  Filled 2016-07-26: qty 2

## 2016-07-26 MED ORDER — HYDROMORPHONE HCL 1 MG/ML IJ SOLN: 1.0000 mg | Freq: Once | INTRAMUSCULAR | Status: DC

## 2016-07-26 MED ORDER — ACETAMINOPHEN 500 MG PO TABS
1000.0000 mg | ORAL_TABLET | Freq: Once | ORAL | Status: AC
  Administered 2016-07-26: 1000 mg via ORAL
  Filled 2016-07-26: qty 2

## 2016-07-26 MED ORDER — IOPAMIDOL (ISOVUE-300) INJECTION 61%
INTRAVENOUS | Status: AC
  Administered 2016-07-27: 100 mL
  Filled 2016-07-26: qty 100

## 2016-07-26 NOTE — ED Notes (Signed)
Patient transported to CT 

## 2016-07-26 NOTE — ED Provider Notes (Signed)
[facility] DEPT Provider Note   CSN: [id number] Arrival date & time: 07/26/16  2250     History   Chief Complaint Chief Complaint  Patient presents with  . Abdominal Pain    HPI Sarah [name] is a 51 y.o. female.  Patient presents emergency department with chief complaint of left lower abdominal pain. She states that the pain started 2 days ago and has progressively worsened. She reports associated diarrhea, but denies any vomiting. She states that she has had some mild associated nausea. She denies any associated fevers chills. Denies any unusual vaginal discharge bleeding. Denies any dysuria or hematuria. She has tried taking Tylenol with some relief. Her symptoms are worsened with palpation. There are no other associated symptoms or other modifying factors.   The history is provided by the patient. No language interpreter was used.    Past Medical History:  Diagnosis Date  . 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 Chronic back pain   . DDD (degenerative disc disease), cervical   . DJD (degenerative joint disease)   . History of rectal fissure   . HIT (heparin-induced thrombocytopenia) ([place])   . Hypertension   . Obesity   . Pulmonary embolus, right ([place]) 2015  . Rotator cuff tendonitis right    Patient Active Problem List   Diagnosis Date Noted  . Bilateral rotator cuff syndrome 02/12/2016  . E. coli UTI 08/01/2015  . Constipation due to neurogenic bowel 08/01/2015  . Muscle spasticity   . 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 Spastic paraparesis   . Neurogenic bladder   . History of pulmonary embolism   . Post-operative pain   . Metastatic cancer ([place])   . Steroid-induced hyperglycemia   . Benign essential HTN   . Acute blood loss anemia   . Leukocytosis   . Depression   . Obstipation   . Thoracic spine tumor 07/06/2015  . 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 Cord compression ([place])   .  Encounter for intubation   . Lower extremity weakness   . Respiratory failure ([place])   . Postoperative hypothyroidism   . Peripheral edema 06/07/2015  . Hypothyroidism, postop 03/08/2015  . 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 Dysuria 09/04/2014  . Hypertension 05/01/2014  . Ingrown right big toenail 05/01/2014  . Hypokalemia 05/01/2014  . GERD (gastroesophageal reflux disease) 02/14/2014  . Dysphagia, pharyngoesophageal phase 02/06/2014  . Type 2 diabetes mellitus without complication ([place]) 63/87/5643  . Constipation   . Neurogenic bowel   . 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 ([place]) 01/20/2014  . Metastatic cancer to spine ([place]) 01/19/2014  . Rotator cuff tendonitis   . Acute postoperative respiratory failure ([place]) 01/09/2014  . 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 Leg weakness, bilateral 12/13/2013    Past Surgical History:  Procedure Laterality Date  . LAMINECTOMY N/A 12/14/2013   Procedure: THORACIC LAMINECTOMY FOR TUMOR THORACIC THREE;  Surgeon: Ashok Pall, MD;  Location: [facility] [facility] NEURO ORS;  Service: Neurosurgery;  Laterality: N/A;  THORACIC LAMINECTOMY FOR TUMOR THORACIC THREE  . LAMINECTOMY N/A 07/05/2015   Procedure: THORACIC LAMINECTOMY FOR TUMOR;  Surgeon: Ashok Pall, MD;  Location: [facility] NEURO ORS;  Service: Neurosurgery;  Laterality: N/A;  . POSTERIOR LUMBAR FUSION 4 LEVEL N/A 12/30/2013   Procedure: Thoracic one-Thoracic five posterior thoracic fusion with pedicle screws;  Surgeon: Marylyn Ishihara  Christella Noa, MD;  Location: [facility] NEURO ORS;  Service: Neurosurgery;  Laterality: N/A;  Thoracic one-Thoracic five posterior thoracic fusion with pedicle screws  . THYROIDECTOMY N/A 01/09/2014   Procedure: TOTAL THYROIDECTOMY;  Surgeon: Izora Gala, MD;  Location: [facility];  Service: ENT;  Laterality: N/A;  . TONSILLECTOMY      OB History    No data available       Home  Medications    Prior to Admission medications   Medication Sig Start Date End Date Taking? Authorizing Provider  baclofen (LIORESAL) 20 MG tablet TAKE  (1)  TABLET  THREE TIMES DAILY. 05/13/16   Bayard Hugger, NP  colchicine 0.6 MG tablet Take 1 tablet (0.6 mg total) by mouth 2 (two) times daily as needed. 07/25/16   [name], MD  lenvatinib 18 mg daily dose (LENVIMA) 10 & 4 (2) MG capsule Take 18 mg by mouth daily. 06/18/16   Brunetta Genera, MD  levothyroxine (SYNTHROID, LEVOTHROID) 200 MCG tablet Take 1 tablet (200 mcg total) by mouth daily before breakfast. 04/09/16   McKeag, Marylynn Pearson, MD  lisinopril (PRINIVIL,ZESTRIL) 10 MG tablet Take 1 tablet (10 mg total) by mouth daily. 02/12/16   [name], MD  mupirocin ointment (BACTROBAN) 2 % Place 1 application into the nose 2 (two) times daily. And on sores on extremities and face 01/31/16   Brunetta Genera, MD  ondansetron (ZOFRAN) 4 MG tablet Take 1 tablet (4 mg total) by mouth every 6 (six) hours. 05/13/16   Bayard Hugger, NP  pantoprazole (PROTONIX) 40 MG tablet Take 1 tablet (40 mg total) by mouth daily. 07/11/16   McKeag, Marylynn Pearson, MD  senna (SENOKOT) 8.6 MG TABS tablet Take 2 tablets (17.2 mg total) by mouth daily. 05/13/16   Bayard Hugger, NP  sertraline (ZOLOFT) 25 MG tablet TAKE 1 TABLET ONCE A DAY 03/04/16   McKeag, Marylynn Pearson, MD  XARELTO 20 MG TABS tablet Take 1 tablet (20 mg total) by mouth daily. 05/29/16   [name], MD  zinc oxide (BALMEX) 11.3 % CREA cream Apply 1 application topically daily as needed. Apply to rectum    [provider]    Family History Family History  Problem Relation Age of Onset  . Hypertension Mother   . Diabetes Mother   . Hypertension Father   . Stroke Father     Social History Social History  Substance Use Topics  . Smoking status: Never Smoker  . Smokeless tobacco: Never Used  . Alcohol use No     Allergies   Bee venom; Keflex [cephalexin]; Tramadol;  Heparin; Hydrocodone; and Oxycodone-acetaminophen   Review of Systems Review of Systems  All other systems reviewed and are negative.    Physical Exam Updated Vital Signs There were no vitals taken for this visit.  Physical Exam  Constitutional: She is oriented to person, place, and time. She appears well-developed and well-nourished.  HENT:  Head: Normocephalic and atraumatic.  Eyes: Conjunctivae and EOM are normal. Pupils are equal, round, and reactive to light.  Neck: Normal range of motion. Neck supple.  Cardiovascular: Normal rate and regular rhythm.  Exam reveals no gallop and no friction rub.   No murmur heard. Pulmonary/Chest: Effort normal and breath sounds normal. No respiratory distress. She has no wheezes. She has no rales. She exhibits no tenderness.  Abdominal: Soft. Bowel sounds are normal. She exhibits no distension and no mass. There is tenderness. There is no rebound and  no guarding.  LLQ TTP, no other focal abdominal tenderness  Musculoskeletal: Normal range of motion. She exhibits no edema or tenderness.  Baseline BLE weakness from thyroid mets to spine, wheel chair bound  Neurological: She is alert and oriented to person, place, and time.  Skin: Skin is warm and dry.  Psychiatric: She has a normal mood and affect. Her behavior is normal. Judgment and thought content normal.  Nursing note and vitals reviewed.    ED Treatments / Results  Labs (all labs ordered are listed, but only abnormal results are displayed) Labs Reviewed  URINALYSIS, ROUTINE W REFLEX MICROSCOPIC - Abnormal; Notable for the following:       Result Value   Color, Urine STRAW (*)    All other components within normal limits  CBC WITH DIFFERENTIAL/PLATELET - Abnormal; Notable for the following:    WBC 3.8 (*)    Hemoglobin 11.9 (*)    All other components within normal limits  COMPREHENSIVE METABOLIC PANEL - Abnormal; Notable for the following:    Glucose, Bld 127 (*)    Creatinine,  Ser 1.30 (*)    Calcium 8.3 (*)    Total Protein 5.7 (*)    Albumin 3.3 (*)    AST 42 (*)    Alkaline Phosphatase 181 (*)    GFR calc non Af Amer 47 (*)    GFR calc Af Amer 54 (*)    All other components within normal limits  LIPASE, BLOOD    EKG  EKG Interpretation None       Radiology Ct Abdomen Pelvis W Contrast  Result Date: 07/27/2016 CLINICAL DATA:  Left lower quadrant pain. EXAM: CT ABDOMEN AND PELVIS WITH CONTRAST TECHNIQUE: Multidetector CT imaging of the abdomen and pelvis was performed using the standard protocol following bolus administration of intravenous contrast. CONTRAST:  17m ISOVUE-300 IOPAMIDOL (ISOVUE-300) INJECTION 61% COMPARISON:  06/03/2016 FINDINGS: Lower chest: Stable linear scarring in the left base. No consolidation or effusion. Hepatobiliary: No focal liver abnormality is seen. No gallstones, gallbladder wall thickening, or biliary dilatation. Pancreas: Unremarkable. No pancreatic ductal dilatation or surrounding inflammatory changes. Spleen: Normal in size without focal abnormality. Adrenals/Urinary Tract: Adrenal glands are unremarkable. Kidneys are normal, without renal calculi, focal lesion, or hydronephrosis. Bladder is unremarkable. Stomach/Bowel: Stomach is within normal limits. Colon appears normal. No evidence of bowel wall thickening, distention, or inflammatory changes. Vascular/Lymphatic: No significant vascular findings are present. No enlarged abdominal or pelvic lymph nodes. Reproductive: Uterus and bilateral adnexa are unremarkable. Other: No focal inflammation.  No ascites. Musculoskeletal: No acute or significant osseous findings. IMPRESSION: No significant abnormality. Electronically Signed   By: DAndreas NewportM.D.   On: 07/27/2016 01:02    Procedures Procedures (including critical care time)  Medications Ordered in ED Medications - No data to display   Initial Impression / Assessment and Plan / ED Course  I have reviewed the  triage vital signs and the nursing notes.  Pertinent labs & imaging results that were available during my care of the patient were reviewed by me and considered in my medical decision making (see chart for details).     Patient with left lower quadrant abdominal tenderness and associated diarrhea. Symptoms started 2 days ago. [name] afebrile, she is not vomiting. I offered the patient pain medicine, but she declined this, and asked for a Tylenol instead. I am concerned about diverticulitis in this patient, and will order a CT abdomen pelvis for further evaluation.  Laboratory workup is reassuring. CT  is negative. Patient's pain has improved since she has been here. Unclear etiology. No vaginal discharge or vaginal complaints.  She has been more aggressive physical therapy, and just barely started walking again. Symptoms could be musculoskeletal and muscle soreness. Recommend warm compresses and ice. Tylenol and ibuprofen as needed. She has follow-up with her primary care provider in 10 days. Clear and specific return precautions given. Patient understands and agrees the plan. She will return if her symptoms worsen.  Final Clinical Impressions(s) / ED Diagnoses   Final diagnoses:  Left lower quadrant pain    New Prescriptions New Prescriptions   No medications on file     Montine Circle, Hershal Coria 07/27/16 [id number]    Elnora Morrison, MD 07/28/16 0020

## 2016-07-26 NOTE — ED Triage Notes (Signed)
Per EMS, pt from home with LLQ pain x "a couple of days". Pain has gotten progressively worse. Pt endorses nausea, no vomiting and some diarrhea. LBM today. EMS vitals: BP-172/100, SpO2-97% room air, P-82

## 2016-07-27 ENCOUNTER — Encounter (HOSPITAL_COMMUNITY): Payer: Self-pay | Admitting: Radiology

## 2016-07-27 ENCOUNTER — Emergency Department (HOSPITAL_COMMUNITY): Payer: Medicare Other

## 2016-07-27 DIAGNOSIS — G8220 Paraplegia, unspecified: Secondary | ICD-10-CM | POA: Diagnosis not present

## 2016-07-27 DIAGNOSIS — R100 Acute abdomen: Secondary | ICD-10-CM | POA: Diagnosis not present

## 2016-07-27 DIAGNOSIS — R1032 Left lower quadrant pain: Secondary | ICD-10-CM | POA: Diagnosis not present

## 2016-07-27 LAB — URINALYSIS, ROUTINE W REFLEX MICROSCOPIC
BILIRUBIN URINE: NEGATIVE
Glucose, UA: NEGATIVE mg/dL
Hgb urine dipstick: NEGATIVE
Ketones, ur: NEGATIVE mg/dL
Leukocytes, UA: NEGATIVE
NITRITE: NEGATIVE
Protein, ur: NEGATIVE mg/dL
SPECIFIC GRAVITY, URINE: 1.013 (ref 1.005–1.030)
pH: 5 (ref 5.0–8.0)

## 2016-07-27 NOTE — ED Notes (Signed)
Pt refused fentanyl at this time.

## 2016-07-29 ENCOUNTER — Ambulatory Visit: Payer: Medicare Other | Admitting: Physical Therapy

## 2016-07-31 ENCOUNTER — Ambulatory Visit: Payer: Medicare Other | Admitting: Physical Therapy

## 2016-08-04 ENCOUNTER — Other Ambulatory Visit: Payer: Self-pay | Admitting: Physical Medicine & Rehabilitation

## 2016-08-04 ENCOUNTER — Ambulatory Visit: Payer: Medicare Other | Admitting: Physical Therapy

## 2016-08-04 DIAGNOSIS — R2689 Other abnormalities of gait and mobility: Secondary | ICD-10-CM

## 2016-08-04 DIAGNOSIS — M6281 Muscle weakness (generalized): Secondary | ICD-10-CM | POA: Diagnosis not present

## 2016-08-04 DIAGNOSIS — G8220 Paraplegia, unspecified: Secondary | ICD-10-CM

## 2016-08-05 ENCOUNTER — Encounter: Payer: Self-pay | Admitting: *Deleted

## 2016-08-05 ENCOUNTER — Ambulatory Visit (INDEPENDENT_AMBULATORY_CARE_PROVIDER_SITE_OTHER): Payer: Medicare Other | Admitting: *Deleted

## 2016-08-05 ENCOUNTER — Ambulatory Visit (INDEPENDENT_AMBULATORY_CARE_PROVIDER_SITE_OTHER): Payer: Medicare Other | Admitting: Internal Medicine

## 2016-08-05 VITALS — BP 204/110 | HR 70 | Temp 98.2°F | Ht 71.0 in | Wt 242.0 lb

## 2016-08-05 DIAGNOSIS — Z0000 Encounter for general adult medical examination without abnormal findings: Secondary | ICD-10-CM | POA: Diagnosis not present

## 2016-08-05 DIAGNOSIS — R7303 Prediabetes: Secondary | ICD-10-CM

## 2016-08-05 DIAGNOSIS — I10 Essential (primary) hypertension: Secondary | ICD-10-CM

## 2016-08-05 LAB — POCT GLYCOSYLATED HEMOGLOBIN (HGB A1C): Hemoglobin A1C: 5.9

## 2016-08-05 MED ORDER — LISINOPRIL-HYDROCHLOROTHIAZIDE 10-12.5 MG PO TABS: 1.0000 | ORAL_TABLET | Freq: Every day | ORAL | 0 refills | Status: DC

## 2016-08-05 MED ORDER — HYDROCHLOROTHIAZIDE 12.5 MG PO TABS: 12.5000 mg | ORAL_TABLET | Freq: Every day | ORAL | 0 refills | Status: DC

## 2016-08-05 MED ORDER — LISINOPRIL 10 MG PO TABS: 10.0000 mg | ORAL_TABLET | Freq: Every day | ORAL | 3 refills | Status: DC

## 2016-08-05 NOTE — Progress Notes (Signed)
I have reviewed this visit and discussed with Howell Rucks, RN, BSN, and agree with her documentation.   Elberta Leatherwood, MD,MS,  PGY3 08/05/2016 6:51 PM

## 2016-08-05 NOTE — Patient Instructions (Addendum)
Sarah [name],  Thank you for taking time to come for yourMedicare Wellness Visit. I appreciate your ongoing commitment to your health goals. Please review the following plan we discussed and let me know if I can assist you in the future.   These are the goals we discussed: Goals    . Drive a vehicle (pt-stated)    . Exercise 7x per week (30 min throughout day) (pt-stated)          Walking/Building strength in legs and arms    . Transfer from w/c to car without slide board (pt-stated)        Fall Prevention in the Home Falls can cause injuries. They can happen to people of all ages. There are many things you can do to make your home safe and to help prevent falls. What can I do on the outside of my home?  Regularly fix the edges of walkways and driveways and fix any cracks.  Remove anything that might make you trip as you walk through a door, such as a raised step or threshold.  Trim any bushes or trees on the path to your home.  Use bright outdoor lighting.  Clear any walking paths of anything that might make someone trip, such as rocks or tools.  Regularly check to see if handrails are loose or broken. Make sure that both sides of any steps have handrails.  Any raised decks and porches should have guardrails on the edges.  Have any leaves, snow, or ice cleared regularly.  Use sand or salt on walking paths during winter.  Clean up any spills in your garage right away. This includes oil or grease spills. What can I do in the bathroom?  Use night lights.  Install grab bars by the toilet and in the tub and shower. Do not use towel bars as grab bars.  Use non-skid mats or decals in the tub or shower.  If you need to sit down in the shower, use a plastic, non-slip stool.  Keep the floor dry. Clean up any water that spills on the floor as soon as it happens.  Remove soap buildup in the tub or shower regularly.  Attach bath mats securely with double-sided non-slip rug  tape.  Do not have throw rugs and other things on the floor that can make you trip. What can I do in the bedroom?  Use night lights.  Make sure that you have a light by your bed that is easy to reach.  Do not use any sheets or blankets that are too big for your bed. They should not hang down onto the floor.  Have a firm chair that has side arms. You can use this for support while you get dressed.  Do not have throw rugs and other things on the floor that can make you trip. What can I do in the kitchen?  Clean up any spills right away.  Avoid walking on wet floors.  Keep items that you use a lot in easy-to-reach places.  If you need to reach something above you, use a strong step stool that has a grab bar.  Keep electrical cords out of the way.  Do not use floor polish or wax that makes floors slippery. If you must use wax, use non-skid floor wax.  Do not have throw rugs and other things on the floor that can make you trip. What can I do with my stairs?  Do not leave any items on the stairs.  Make sure that there are handrails on both sides of the stairs and use them. Fix handrails that are broken or loose. Make sure that handrails are as long as the stairways.  Check any carpeting to make sure that it is firmly attached to the stairs. Fix any carpet that is loose or worn.  Avoid having throw rugs at the top or bottom of the stairs. If you do have throw rugs, attach them to the floor with carpet tape.  Make sure that you have a light switch at the top of the stairs and the bottom of the stairs. If you do not have them, ask someone to add them for you. What else can I do to help prevent falls?  Wear shoes that:  Do not have high heels.  Have rubber bottoms.  Are comfortable and fit you well.  Are closed at the toe. Do not wear sandals.  If you use a stepladder:  Make sure that it is fully opened. Do not climb a closed stepladder.  Make sure that both sides of the  stepladder are locked into place.  Ask someone to hold it for you, if possible.  Clearly mark and make sure that you can see:  Any grab bars or handrails.  First and last steps.  Where the edge of each step is.  Use tools that help you move around (mobility aids) if they are needed. These include:  Canes.  Walkers.  Scooters.  Crutches.  Turn on the lights when you go into a dark area. Replace any light bulbs as soon as they burn out.  Set up your furniture so you have a clear path. Avoid moving your furniture around.  If any of your floors are uneven, fix them.  If there are any pets around you, be aware of where they are.  Review your medicines with your doctor. Some medicines can make you feel dizzy. This can increase your chance of falling. Ask your doctor what other things that you can do to help prevent falls. This information is not intended to replace advice given to you by your health care provider. Make sure you discuss any questions you have with your health care provider. Document Released: 01/04/2009 Document Revised: 08/16/2015 Document Reviewed: 04/14/2014 Elsevier Interactive Patient Education  2017 [place] Maintenance, Female Adopting a healthy lifestyle and getting preventive care can go a long way to promote health and wellness. Talk with your health care provider about what schedule of regular examinations is right for you. This is a good chance for you to check in with your provider about disease prevention and staying healthy. In between checkups, there are plenty of things you can do on your own. Experts have done a lot of research about which lifestyle changes and preventive measures are most likely to keep you healthy. Ask your health care provider for more information. Weight and diet Eat a healthy diet  Be sure to include plenty of vegetables, fruits, low-fat dairy products, and lean protein.  Do not eat a lot of foods high in  solid fats, added sugars, or salt.  Get regular exercise. This is one of the most important things you can do for your health.  Most adults should exercise for at least 150 minutes each week. The exercise should increase your heart rate and make you sweat (moderate-intensity exercise).  Most adults should also do strengthening exercises at least twice a week. This is in addition to the moderate-intensity exercise. Maintain  a healthy weight  Body mass index (BMI) is a measurement that can be used to identify possible weight problems. It estimates body fat based on height and weight. Your health care provider can help determine your BMI and help you achieve or maintain a healthy weight.  For females 39 years of age and older:  A BMI below 18.5 is considered underweight.  A BMI of 18.5 to 24.9 is normal.  A BMI of 25 to 29.9 is considered overweight.  A BMI of 30 and above is considered obese. Watch levels of cholesterol and blood lipids  You should start having your blood tested for lipids and cholesterol at 50 years of age, then have this test every 5 years.  You may need to have your cholesterol levels checked more often if:  Your lipid or cholesterol levels are high.  You are older than 50 years of age.  You are at high risk for heart disease. Cancer screening Lung Cancer  Lung cancer screening is recommended for adults 76-80 years old who are at high risk for lung cancer because of a history of smoking.  A yearly low-dose CT scan of the lungs is recommended for people who:  Currently smoke.  Have quit within the past 15 years.  Have at least a 30-pack-year history of smoking. A pack year is smoking an average of one pack of cigarettes a day for 1 year.  Yearly screening should continue until it has been 15 years since you quit.  Yearly screening should stop if you develop a health problem that would prevent you from having lung cancer treatment. Breast  Cancer  Practice breast self-awareness. This means understanding how your breasts normally appear and feel.  It also means doing regular breast self-exams. Let your health care provider know about any changes, no matter how small.  If you are in your 20s or 30s, you should have a clinical breast exam (CBE) by a health care provider every 1-3 years as part of a regular health exam.  If you are 53 or older, have a CBE every year. Also consider having a breast X-ray (mammogram) every year.  If you have a family history of breast cancer, talk to your health care provider about genetic screening.  If you are at high risk for breast cancer, talk to your health care provider about having an MRI and a mammogram every year.  Breast cancer gene (BRCA) assessment is recommended for women who have family members with BRCA-related cancers. BRCA-related cancers include:  Breast.  Ovarian.  Tubal.  Peritoneal cancers.  Results of the assessment will determine the need for genetic counseling and BRCA1 and BRCA2 testing. Cervical Cancer  Your health care provider may recommend that you be screened regularly for cancer of the pelvic organs (ovaries, uterus, and vagina). This screening involves a pelvic examination, including checking for microscopic changes to the surface of your cervix (Pap test). You may be encouraged to have this screening done every 3 years, beginning at age 82.  For women ages 2-65, health care providers may recommend pelvic exams and Pap testing every 3 years, or they may recommend the Pap and pelvic exam, combined with testing for human papilloma virus (HPV), every 5 years. Some types of HPV increase your risk of cervical cancer. Testing for HPV may also be done on women of any age with unclear Pap test results.  Other health care providers may not recommend any screening for nonpregnant women who are considered low risk  for pelvic cancer and who do not have symptoms. Ask your  health care provider if a screening pelvic exam is right for you.  If you have had past treatment for cervical cancer or a condition that could lead to cancer, you need Pap tests and screening for cancer for at least 20 years after your treatment. If Pap tests have been discontinued, your risk factors (such as having a new sexual partner) need to be reassessed to determine if screening should resume. Some women have medical problems that increase the chance of getting cervical cancer. In these cases, your health care provider may recommend more frequent screening and Pap tests. Colorectal Cancer  This type of cancer can be detected and often prevented.  Routine colorectal cancer screening usually begins at 50 years of age and continues through 50 years of age.  Your health care provider may recommend screening at an earlier age if you have risk factors for colon cancer.  Your health care provider may also recommend using home test kits to check for hidden blood in the stool.  A small camera at the end of a tube can be used to examine your colon directly (sigmoidoscopy or colonoscopy). This is done to check for the earliest forms of colorectal cancer.  Routine screening usually begins at age 79.  Direct examination of the colon should be repeated every 5-10 years through 50 years of age. However, you may need to be screened more often if early forms of precancerous polyps or small growths are found. Skin Cancer  Check your skin from head to toe regularly.  Tell your health care provider about any new moles or changes in moles, especially if there is a change in a mole's shape or color.  Also tell your health care provider if you have a mole that is larger than the size of a pencil eraser.  Always use sunscreen. Apply sunscreen liberally and repeatedly throughout the day.  Protect yourself by wearing long sleeves, pants, a wide-brimmed hat, and sunglasses whenever you are outside. Heart  disease, diabetes, and high blood pressure  High blood pressure causes heart disease and increases the risk of stroke. High blood pressure is more likely to develop in:  People who have blood pressure in the high end of the normal range (130-139/85-89 mm Hg).  People who are overweight or obese.  People who are African American.  If you are 61-31 years of age, have your blood pressure checked every 3-5 years. If you are 69 years of age or older, have your blood pressure checked every year. You should have your blood pressure measured twice-once when you are at a hospital or clinic, and once when you are not at a hospital or clinic. Record the average of the two measurements. To check your blood pressure when you are not at a hospital or clinic, you can use:  An automated blood pressure machine at a pharmacy.  A home blood pressure monitor.  If you are between 27 years and 83 years old, ask your health care provider if you should take aspirin to prevent strokes.  Have regular diabetes screenings. This involves taking a blood sample to check your fasting blood sugar level.  If you are at a normal weight and have a low risk for diabetes, have this test once every three years after 50 years of age.  If you are overweight and have a high risk for diabetes, consider being tested at a younger age or more often. Preventing  infection Hepatitis B  If you have a higher risk for hepatitis B, you should be screened for this virus. You are considered at high risk for hepatitis B if:  You were born in a country where hepatitis B is common. Ask your health care provider which countries are considered high risk.  Your parents were born in a high-risk country, and you have not been immunized against hepatitis B (hepatitis B vaccine).  You have HIV or AIDS.  You use needles to inject street drugs.  You live with someone who has hepatitis B.  You have had sex with someone who has hepatitis  B.  You get hemodialysis treatment.  You take certain medicines for conditions, including cancer, organ transplantation, and autoimmune conditions. Hepatitis C  Blood testing is recommended for:  Everyone born from [birth year] through 1964.  Anyone with known risk factors for hepatitis C. Sexually transmitted infections (STIs)  You should be screened for sexually transmitted infections (STIs) including gonorrhea and chlamydia if:  You are sexually active and are younger than 50 years of age.  You are older than 50 years of age and your health care provider tells you that you are at risk for this type of infection.  Your sexual activity has changed since you were last screened and you are at an increased risk for chlamydia or gonorrhea. Ask your health care provider if you are at risk.  If you do not have HIV, but are at risk, it may be recommended that you take a prescription medicine daily to prevent HIV infection. This is called pre-exposure prophylaxis (PrEP). You are considered at risk if:  You are sexually active and do not regularly use condoms or know the HIV status of your partner(s).  You take drugs by injection.  You are sexually active with a partner who has HIV. Talk with your health care provider about whether you are at high risk of being infected with HIV. If you choose to begin PrEP, you should first be tested for HIV. You should then be tested every 3 months for as long as you are taking PrEP. Pregnancy  If you are premenopausal and you may become pregnant, ask your health care provider about preconception counseling.  If you may become pregnant, take 400 to 800 micrograms (mcg) of folic acid every day.  If you want to prevent pregnancy, talk to your health care provider about birth control (contraception). Osteoporosis and menopause  Osteoporosis is a disease in which the bones lose minerals and strength with aging. This can result in serious bone fractures. Your  risk for osteoporosis can be identified using a bone density scan.  If you are 79 years of age or older, or if you are at risk for osteoporosis and fractures, ask your health care provider if you should be screened.  Ask your health care provider whether you should take a calcium or vitamin D supplement to lower your risk for osteoporosis.  Menopause may have certain physical symptoms and risks.  Hormone replacement therapy may reduce some of these symptoms and risks. Talk to your health care provider about whether hormone replacement therapy is right for you. Follow these instructions at home:  Schedule regular health, dental, and eye exams.  Stay current with your immunizations.  Do not use any tobacco products including cigarettes, chewing tobacco, or electronic cigarettes.  If you are pregnant, do not drink alcohol.  If you are breastfeeding, limit how much and how often you drink alcohol.  Limit  alcohol intake to no more than 1 drink per day for nonpregnant women. One drink equals 12 ounces of beer, 5 ounces of wine, or 1 ounces of hard liquor.  Do not use street drugs.  Do not share needles.  Ask your health care provider for help if you need support or information about quitting drugs.  Tell your health care provider if you often feel depressed.  Tell your health care provider if you have ever been abused or do not feel safe at home. This information is not intended to replace advice given to you by your health care provider. Make sure you discuss any questions you have with your health care provider. Document Released: 09/23/2010 Document Revised: 08/16/2015 Document Reviewed: 12/12/2014 Elsevier Interactive Patient Education  2017 [employer].

## 2016-08-05 NOTE — Progress Notes (Signed)
Subjective:   Sarah [name] is a 51 y.o. female who presents in wheelchair with sister for an Initial Medicare Annual Wellness Visit.  Cardiac Risk Factors include: none;hypertension;sedentary lifestyle     Objective:    Today's Vitals   08/05/16 1110 08/05/16 1136  BP: (!) 202/110 (!) 204/110  Pulse: 70   Temp: 98.2 F (36.8 C)   TempSrc: Oral   SpO2: 98%   Weight: 242 lb (109.8 kg)   Height: '5\' 11"'$  (1.803 m)   PainSc: 2     Body mass index is 33.75 kg/m.   BP 204/110 Right arm manually with large cuff. Patient reports taking lisinopril 5 mg at 0900 this AM.Patient states she "feels fine." Denies Headache, CP, SHOB, visual disturbances. Discussed with Preceptor, Dr. Andria Frames, and patient to be seen as SD by Dr. Juleen China.  Current Medications (verified) Outpatient Encounter Prescriptions as of 08/05/2016  Medication Sig  . baclofen (LIORESAL) 20 MG tablet TAKE  (1)  TABLET  THREE TIMES DAILY.  [name] Kitchen colchicine 0.6 MG tablet Take 1 tablet (0.6 mg total) by mouth 2 (two) times daily as needed.  [name] Kitchen lenvatinib 18 mg daily dose (LENVIMA) 10 & 4 (2) MG capsule Take 18 mg by mouth daily.  [name] Kitchen levothyroxine (SYNTHROID, LEVOTHROID) 200 MCG tablet Take 1 tablet (200 mcg total) by mouth daily before breakfast.  . mupirocin ointment (BACTROBAN) 2 % Place 1 application into the nose 2 (two) times daily. And on sores on extremities and face  . ondansetron (ZOFRAN) 4 MG tablet Take 1 tablet (4 mg total) by mouth every 6 (six) hours.  . pantoprazole (PROTONIX) 40 MG tablet Take 1 tablet (40 mg total) by mouth daily.  [name] Kitchen senna (SENOKOT) 8.6 MG TABS tablet Take 2 tablets (17.2 mg total) by mouth daily.  . sertraline (ZOLOFT) 25 MG tablet TAKE 1 TABLET ONCE A DAY  . XARELTO 20 MG TABS tablet Take 1 tablet (20 mg total) by mouth daily.  . [DISCONTINUED] lisinopril (PRINIVIL,ZESTRIL) 5 MG tablet Take 5 mg by mouth daily.  . [DISCONTINUED] lisinopril (PRINIVIL,ZESTRIL) 10 MG tablet Take 1 tablet (10 mg  total) by mouth daily. (Patient not taking: Reported on 07/26/2016)   No facility-administered encounter medications on file as of 08/05/2016.     Allergies (verified) Bee venom; Keflex [cephalexin]; Tramadol; Gabapentin; Heparin; Hydrocodone; and Oxycodone-acetaminophen   History: Past Medical History:  Diagnosis Date  . 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 Chronic back pain   . DDD (degenerative disc disease), cervical   . DJD (degenerative joint disease)   . History of rectal fissure   . HIT (heparin-induced thrombocytopenia) ([place])   . Hypertension   . Obesity   . 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 Pulmonary embolus, right ([place]) 2015  . Rotator cuff tendonitis right   Past Surgical History:  Procedure Laterality Date  . LAMINECTOMY N/A 12/14/2013   Procedure: THORACIC LAMINECTOMY FOR TUMOR THORACIC THREE;  Surgeon: Ashok Pall, MD;  Location: [place] [facility]UR[facility] OR[facility];  Service: Neurosurgery;  Laterality: N/A;  THORACIC LAMINECTOMY FOR TUMOR THORACIC THREE  . LAMINECTOMY N/A 07/05/2015   Procedure: THORACIC LAMINECTOMY FOR TUMOR;  Surgeon: Ashok Pall, MD;  Location: [place] NEURO ORS;  Service: Neurosurgery;  Laterality: N/A;  . POSTERIOR LUMBAR FUSION 4 LEVEL N/A 12/30/2013   Procedure: Thoracic one-Thoracic five posterior thoracic fusion with pedicle screws;  Surgeon: Ashok Pall, MD;  Location: [place] NEUR[facility] ORS;  Service: Neurosurgery;  Laterality: N/A;  Thoracic one-Thoracic five posterior thoracic fusion with pedicle screws  . THYROIDECTOMY N/A 01/09/2014   Procedure: TOTAL THYROIDECTOMY;  Surgeon: Izora Gala, MD;  Location: [place];  Service: ENT;  Laterality: N/A;  . TONSILLECTOMY     Family History  Problem Relation Age of Onset  . Hypertension Mother   . Diabetes Mother   . Hypertension Father   . Stroke Father   . Diabetes Father   . Diabetes Sister   . Diabetes Sister    Social History   Occupational History  . Not on file.   Social  History Main Topics  . Smoking status: Never Smoker  . Smokeless tobacco: Never Used  . Alcohol use No  . Drug use: No  . Sexual activity: Not Currently    Tobacco Counseling Counseling given: Yes Patient has never smoked and has no plans to start.  Activities of Daily Living In your present state of health, do you have any difficulty performing the following activities: 08/05/2016  Hearing? N  Vision? N  Difficulty concentrating or making decisions? N  Walking or climbing stairs? Y  Dressing or bathing? Y  Doing errands, shopping? Y  Preparing Food and eating ? N  Using the Toilet? N  In the past six months, have you accidently leaked urine? Y  Do you have problems with loss of bowel control? N  Managing your Medications? N  Managing your Finances? N  Housekeeping or managing your Housekeeping? Y  Some recent data might be hidden   Home Safety:  My home has a working smoke alarm:  Yes X 1           My home throw rugs have been fastened down to the floor or removed:  Removed I have non-slip mats in the bathtub and shower:  Patient does bed baths only         All my home's stairs have railings or bannisters: One level home with outside ramp.          My home's floors, stairs and hallways are free from clutter, wires and cords:  Yes     I wear seatbelts consistently:  Yes   Immunizations and Health Maintenance There is no immunization history for the selected administration types on file for this patient. Health Maintenance Due  Topic Date Due  . FOOT EXAM  04/08/1976  . OPHTHALMOLOGY EXAM  04/08/1976  . TETANUS/TDAP  04/08/1985  . HEMOGLOBIN A1C  01/05/2016  . MAMMOGRAM  04/08/2016  . COLONOSCOPY  04/08/2016  Hgb A1C done today = 5.9 up from 5.7 on 07/06/2015 Patient will schedule appt for eye exam. List of doctors who take Medicare given to patient. Patient will obtain TDaP at local pharmacy Patient will [facility] to schedule Screening Mammogram.  Contact info given. Patient given information on FIT and colonoscopy with contact info for GI  Patient Care Team: McKeag, Marylynn Pearson, MD as PCP - General (Family Medicine) [name], MD as Consulting Physician (Physical Medicine and Rehabilitation) Ashok Pall, MD as Consulting Physician (Neurosurgery) Brunetta Genera, MD as Consulting Physician (Hematology) Jacelyn Pi, MD as Consulting Physician (Endocrinology)  Indicate any recent Medical Services you may have received from other than [facility] providers in the past year (date may be approximate).     Assessment:   This is a routine wellness examination for Keven.   Hearing/Vision screen  Hearing Screening   Method: Audiometry   '125Hz'$  '250Hz'$  '500Hz'$  '1000Hz'$  '2000Hz'$  '3000Hz'$   $'4000Hz'd$  '6000Hz'$  '8000Hz'$   Right ear:   '25 25 25  '$ Fail    Left ear:   '25 25 25  '$ Fail      Dietary issues and exercise activities discussed: Current Exercise Habits: Structured exercise class (In PT), Type of exercise: strength training/weights (walking on parrellel  bars and will begin lifting 3 lb weights), Time (Minutes): 45, Frequency (Times/Week): 2, Weekly Exercise (Minutes/Week): 90, Intensity: Mild, Exercise limited by: Other - see comments (Patient confined to wc 2/2 paraplegia)  Goals    . Drive a vehicle (pt-stated)    . Exercise 7x per week (30 min throughout day) (pt-stated)          Walking/Building strength in legs and arms    . Transfer from w/c to car without slide board (pt-stated)      Depression Screen PHQ 2/9 Scores 08/05/2016 02/12/2016 11/13/2015 09/04/2015 12/26/2014 09/19/2014 04/20/2014  PHQ - 2 Score 0 0 0 0 1 1 0  PHQ- 9 Score - - - 0 - 2 -    Fall Risk Fall Risk  08/05/2016 05/13/2016 02/12/2016 11/13/2015 09/04/2015  Falls in the past year? No No Exclusion - non ambulatory Exclusion - non ambulatory No  Risk for fall due to : - - - - -   TUG Test:  Unable to do 2/2 paraplegia  Cognitive Function: Mini-Cog  Failed with score  2/5   Screening Tests Health Maintenance  Topic Date Due  . FOOT EXAM  04/08/1976  . OPHTHALMOLOGY EXAM  04/08/1976  . TETANUS/TDAP  04/08/1985  . HEMOGLOBIN A1C  01/05/2016  . MAMMOGRAM  04/08/2016  . COLONOSCOPY  04/08/2016  . INFLUENZA VACCINE  10/22/2016  . PAP SMEAR  02/08/2018  . PNEUMOCOCCAL POLYSACCHARIDE VACCINE (2) 12/25/2019  . HIV Screening  Completed      Plan:     BP 204/110 Right arm manually with large cuff. Patient reports taking lisinopril 5 mg at 0900 this AM.Patient states she "feels fine." Denies Headache, CP, SHOB, visual disturbances. Discussed with Preceptor, Dr. Andria Frames, and patient to be seen as SD by Dr. Juleen China.  Hgb A1C done today = 5.9 up from 5.7 on 07/06/2015 Patient will schedule appt for eye exam. List of doctors who take Medicare given to patient. Patient will obtain TDaP at local pharmacy Patient will contact the [facility] to schedule Screening Mammogram. Contact info given. Patient given information on FIT and colonoscopy with contact info for GI  I have personally reviewed and noted the following in the patient's chart:   . Medical and social history . Use of alcohol, tobacco or illicit drugs  . Current medications and supplements . Functional ability and status . Nutritional status . Physical activity . Advanced directives . List of other physicians . Hospitalizations, surgeries, and ER visits in previous 12 months . Vitals . Screenings to include cognitive, depression, and falls . Referrals and appointments  In addition, I have reviewed and discussed with patient certain preventive protocols, quality metrics, and best practice recommendations. A written personalized care plan for preventive services as well as general preventive health recommendations were provided to patient.     Velora Heckler, RN   08/05/2016

## 2016-08-05 NOTE — Therapy (Signed)
[facility] [address] [place], [place], [address] Phone: [phone]   Fax:  [phone]  Physical Therapy Treatment  Patient Details  Name: Sarah [name] MRN: [id number] Date of Birth: Jul 01, 1965 Referring Provider: Danella Sensing, NP  Encounter Date: 08/04/2016      PT End of Session - 08/05/16 2243    Visit Number 5   Date for PT Re-Evaluation 09/01/16   Authorization Type UHC Medicare   Authorization Time Period 07-03-16 - 09-01-16   PT Start Time 0931   PT Stop Time 1025   PT Time Calculation (min) 54 min   Equipment Utilized During Treatment Gait belt      Past Medical History:  Diagnosis Date  . 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 Chronic back pain   . DDD (degenerative disc disease), cervical   . DJD (degenerative joint disease)   . History of rectal fissure   . HIT (heparin-induced thrombocytopenia) ([place])   . Hypertension   . Obesity   . 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 Pulmonary embolus, right ([place]) 2015  . Rotator cuff tendonitis right    Past Surgical History:  Procedure Laterality Date  . LAMINECTOMY N/A 12/14/2013   Procedure: THORACIC LAMINECTOMY FOR TUMOR THORACIC THREE;  Surgeon: Ashok Pall, MD;  Location: [place] NEUR[facility] ORS;  Service: Neurosurgery;  Laterality: N/A;  THORACIC LAMINECTOMY FOR TUMOR THORACIC THREE  . LAMINECTOMY N/A 07/05/2015   Procedure: THORACIC LAMINECTOMY FOR TUMOR;  Surgeon: Ashok Pall, MD;  Location: [place] NEURO ORS;  Service: Neurosurgery;  Laterality: N/A;  . POSTERIOR LUMBAR FUSION 4 LEVEL N/A 12/30/2013   Procedure: Thoracic one-Thoracic five posterior thoracic fusion with pedicle screws;  Surgeon: Ashok Pall, MD;  Location: [place] NEUR[facility] ORS;  Service: Neurosurgery;  Laterality: N/A;  Thoracic one-Thoracic five posterior thoracic fusion with pedicle screws  . THYROIDECTOMY N/A 01/09/2014   Procedure: TOTAL THYROIDECTOMY;   Surgeon: Izora Gala, MD;  Location: [place];  Service: ENT;  Laterality: N/A;  . TONSILLECTOMY      There were no vitals filed for this visit.      Subjective Assessment - 08/05/16 2239    Subjective Pt states she had alot of pain in Rt upper thigh and in trunk - went to ED last Sat., 07-26-16 due to severity of pain - "they didn't find anything"   [name] accompained by: Family member   Pertinent History thoracic laminectomy for spinal tumor 12-14-13; 07-05-15 for 2nd sx due to tumor regrowth:  DDD; Rt RTC tendonitis; DDD and DJD:  chronic back pain   Patient Stated Goals get legs stronger and to walk   Currently in Pain? Yes   Pain Score 2    Pain Location Leg   Pain Orientation Right;Left   Pain Descriptors / Indicators Tingling   Pain Type Chronic pain   Pain Frequency Constant                         [facility] Adult PT Treatment/Exercise - 08/05/16 0001      Ambulation/Gait   Ambulation/Gait Yes   Ambulation/Gait Assistance 3: Mod assist  each knee blocked with PT's knee to prevent buckling   Ambulation Distance (Feet) 10 Feet  4 reps   Assistive device Parallel bars     Knee/Hip Exercises: Aerobic   Stationary Bike SciFit level 2.0 with UE's intermittently and LE's x 5"  TherAct:  Pt performed car transfer from manual wheelchair to front passenger seat with sliding board with SBA and with  Cues to transfer body into car first and then place feet in car - pt had been placing feet in car first, and then sliding body in car   Pt stood from wheelchair to RW with +2 mod to min assist; pt stood approx. 15 secs prior to fatigue          PT Short Term Goals - 07/25/16 1114      PT SHORT TERM GOAL #1   Title Pt. will transfer without sliding board from mat to wheelchair with SBA.                08-02-16 TARGET DATE   Baseline met 09-18-14   Time 4   Period Weeks   Status New     PT SHORT TERM GOAL #2   Title Pt will perform sit to stand from  wheelchair to // bars with +1 min assist.       08-02-16   Time 4   Period Weeks   Status New     PT SHORT TERM GOAL #3   Title Independent in HEP for LE strengthening and stretching.                        08-02-16   Time 4   Period Weeks   Status New     PT SHORT TERM GOAL #4   Title Pt will stand inside // bars for 3" with bil. UE support with +1 min assist.   08-02-16   Time 4   Period Weeks   Status New     PT SHORT TERM GOAL #5   Title Incr. endurance/activity tolerance so pt. able to perform 10' nonstop on recumbent bike (Scifit)               08-02-16   Time 4   Period Weeks   Status New     PT SHORT TERM GOAL #6   Title Obtain consult for bil. KAFO's for LE's.   08-02-16;   met 07-24-16   Time 4   Status Achieved     PT SHORT TERM GOAL #7   Title Stand for 3" at counter for LE weight-bearing with +1 min assist.  (06-29-15)   Time 4   Period Weeks   Status New           PT Long Term Goals - 07/04/16 1403      PT LONG TERM GOAL #1   Title Transfer wheelchair to/from mat with SBA without sliding board modified independently    09-01-16 TARGET DATE   Time 8   Period Weeks   Status New     PT LONG TERM GOAL #2   Title Stand at countertop (or at trampoline at home) modified independently for at least 5" per pt report to demo increased LE strength and improved standing tolerance     PT LONG TERM GOAL #4   Title Assess gait with orthoses if obtained.          09-01-16 TARGET DATE   Time 8   Period Weeks   Status New     PT LONG TERM GOAL #5   Title Pt will tolerate rolling prone and maintaining position for at least 10 mins for stretching hip flexors.   09-01-16 TARGET DATE   Time 8   Period Weeks  Status New               Plan - 08/05/16 2245    Clinical Impression Statement Pt improving with gait training - no occurrence of knee buckling occurred in standing position; pt able to perform sit to stand from wheelchair to RW with less assistance today  compared to that needed in previous session   Rehab Potential Good   Clinical Impairments Affecting Rehab Potential severity of impairments and length of time since onset   PT Frequency 2x / week   PT Duration 8 weeks   PT Treatment/Interventions ADLs/Self Care Home Management;Therapeutic exercise;Balance training;Therapeutic activities;Functional mobility training;Gait training;Neuromuscular re-education;Patient/family education;DME Instruction;Wheelchair mobility training;Passive range of motion   PT Next Visit Plan gait training with RW with assist   PT Home Exercise Plan stretches and LE strengthening   Consulted and Agree with Plan of Care Patient      Patient will benefit from skilled therapeutic intervention in order to improve the following deficits and impairments:  Abnormal gait, Difficulty walking, Decreased balance, Decreased activity tolerance, Impaired tone, Decreased strength  Visit Diagnosis: Other abnormalities of gait and mobility  Muscle weakness (generalized)     Problem List Patient Active Problem List   Diagnosis Date Noted  . Bilateral rotator cuff syndrome 02/12/2016  . E. coli UTI 08/01/2015  . Constipation due to neurogenic bowel 08/01/2015  . Muscle spasticity   . 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 Spastic paraparesis   . Neurogenic bladder   . History of pulmonary embolism   . Post-operative pain   . Metastatic cancer ([place])   . Steroid-induced hyperglycemia   . Benign essential HTN   . Acute blood loss anemia   . Leukocytosis   . Depression   . Obstipation   . Thoracic spine tumor 07/06/2015  . 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 Cord compression ([place])   . Encounter for intubation   . Lower extremity weakness   . Respiratory failure ([place])   . Postoperative hypothyroidism   . Peripheral edema 06/07/2015  . Hypothyroidism, postop 03/08/2015  . 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 Dysuria 09/04/2014  . Hypertension 05/01/2014  . Ingrown right big toenail 05/01/2014  . Hypokalemia 05/01/2014  . GERD (gastroesophageal reflux disease) 02/14/2014  . Dysphagia, pharyngoesophageal phase 02/06/2014  . Type 2 diabetes mellitus without complication ([place]) 00/93/8182  . Constipation   . Neurogenic bowel   . 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 Metastatic cancer to spine ([place]) 01/19/2014  . Rotator cuff tendonitis   . Acute postoperative respiratory failure ([place]) 01/09/2014  . 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 Leg weakness, bilateral 12/13/2013    Alda Lea, PT 08/05/2016, 10:49 PM  [facility] [address] [place], [place], [address] Phone: [phone]   Fax:  [phone]  Name: BETZABETH DERRINGER MRN: [id number] Date of Birth: May 25, 1965

## 2016-08-05 NOTE — Patient Instructions (Addendum)
I have prescribed Lisinopril 10 mg and HCTZ 12.5 mg for your new medication regimen. It will be available at your pharmacy. Please return this Friday for a nurse BP check. Please return in one week for a lab visit to check electrolytes, kidney function with the changes in your medication. If you start having headaches, vision changes, chest pain, etc you need to be seen.

## 2016-08-06 NOTE — Progress Notes (Signed)
Subjective:    Sarah [name] - 51 y.o. female MRN [id number]  Date of birth: [birth year]  HPI  Sarah [name] is here for annual medicare wellness visit, found to have significantly elevated BPs.  Elevated BP: With h/o HTN. Currently taking Lisinopril 5 mg daily which she reports compliance with, took most recently this AM prior to [place]. Patient does not monitor BPs at home but does own a cuff. She reports BPs have never been this high. She denies significant pain or current illness. She denies headaches, vision changes, chest pain, and SOB. She denies new neurological changes such as focal weakness, loss of sensation in an extremity or face, changes in speech, or confusion.    -  reports that she has never smoked. She has never used smokeless tobacco. - Review of Systems: Per HPI. - Past Medical History: Patient Active Problem List   Diagnosis Date Noted  . Bilateral rotator cuff syndrome 02/12/2016  . E. coli UTI 08/01/2015  . Constipation due to neurogenic bowel 08/01/2015  . Muscle spasticity   . 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 Spastic paraparesis   . Neurogenic bladder   . History of pulmonary embolism   . Post-operative pain   . Metastatic cancer ([place])   . Steroid-induced hyperglycemia   . Benign essential HTN   . Acute blood loss anemia   . Leukocytosis   . Depression   . Obstipation   . Thoracic spine tumor 07/06/2015  . 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 Cord compression ([place])   . Encounter for intubation   . Lower extremity weakness   . Respiratory failure ([place])   . Postoperative hypothyroidism   . Peripheral edema 06/07/2015  . Hypothyroidism, postop 03/08/2015  . Secondary malignant neoplasm of vertebral column ([place]) 03/08/2015  . 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 Dysuria 09/04/2014  . Hypertension 05/01/2014  . Ingrown right big toenail 05/01/2014  . Hypokalemia 05/01/2014  . GERD (gastroesophageal reflux disease) 02/14/2014  .  Dysphagia, pharyngoesophageal phase 02/06/2014  . Type 2 diabetes mellitus without complication ([place]) 37/90/2409  . Constipation   . Neurogenic bowel   . 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 Metastatic cancer to spine ([place]) 01/19/2014  . Rotator cuff tendonitis   . Acute postoperative respiratory failure ([place]) 01/09/2014  . 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 Leg weakness, bilateral 12/13/2013   - Medications: reviewed and updated   Objective:   Physical Exam BP (!) 204/110   Pulse 70   Temp 98.2 F (36.8 C) (Oral)   Ht '5\' 11"'$  (1.803 m)   Wt 242 lb (109.8 kg)   SpO2 98%   BMI 33.75 kg/m  Gen: NAD, alert, cooperative with exam, well-appearing, in wheelchair  HEENT: EOMI. PERRL. Fundoscopic discs visualized without apparent papilledema.  CV: RRR, good S1/S2, no murmur, no edema, capillary refill brisk  Resp: CTABL, no wheezes, non-labored Neuro: Alert and oriented x3. CN II-XII grossly intact. Strength 5/5 in upper extremities bilaterally. Strength 4/5 bilaterally in lower extremities (chronic per patient). Speech clear.  Psych: good insight, appropriate thought content     Assessment & Plan:   1. Essential hypertension With significant elevation in BP. No red flag symptoms. Exam is benign. Patient stable for outpatient follow up given lack of symptoms/signs with elevated BP that would be concerning ACS or CVA. Will increase Lisinopril from 5 mg to 10 mg daily and add  HCTZ 12.5 mg. Patient to start monitoring BP at home and to keep log for the next couple of weeks. Return for RN BP check in 3 days. Return for BMET in one week. Strict return precautions discussed. Discussed patient with Dr. Andria Frames who helped to formulate this plan.  - Basic Metabolic Panel; Future - lisinopril (PRINIVIL,ZESTRIL) 10 MG tablet; Take 1 tablet (10 mg total) by mouth daily.  Dispense: 30 tablet; Refill: 3 - hydrochlorothiazide  (HYDRODIURIL) 12.5 MG tablet; Take 1 tablet (12.5 mg total) by mouth daily.  Dispense: 30 tablet; Refill: 0   Phill Myron, D.O. 08/06/2016, 8:39 AM PGY-2, [place]

## 2016-08-07 ENCOUNTER — Ambulatory Visit: Payer: Medicare Other | Admitting: Physical Therapy

## 2016-08-07 ENCOUNTER — Telehealth: Payer: Self-pay | Admitting: Physical Therapy

## 2016-08-07 DIAGNOSIS — M6281 Muscle weakness (generalized): Secondary | ICD-10-CM | POA: Diagnosis not present

## 2016-08-07 DIAGNOSIS — R2689 Other abnormalities of gait and mobility: Secondary | ICD-10-CM | POA: Diagnosis not present

## 2016-08-07 NOTE — Telephone Encounter (Signed)
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  They will benefit from use of bilateral AFO in order to improve safety with functional mobility.    If you agree, please submit request in EPIC under referral for DME (list bilateral AFO in comments) or fax to [facility] at [phone].   Thank you, Anchor Dwan, [place], PT   (she is seeing you on Tues, 5-22)  [facility] [address] [place], [place]  [address] Phone:  [phone] Fax:  [phone]

## 2016-08-08 DIAGNOSIS — C73 Malignant neoplasm of thyroid gland: Secondary | ICD-10-CM | POA: Diagnosis not present

## 2016-08-08 DIAGNOSIS — G8220 Paraplegia, unspecified: Secondary | ICD-10-CM | POA: Diagnosis not present

## 2016-08-08 DIAGNOSIS — G839 Paralytic syndrome, unspecified: Secondary | ICD-10-CM | POA: Diagnosis not present

## 2016-08-08 NOTE — Therapy (Signed)
[facility] [address] [place], [place], [address] Phone: [phone]   Fax:  [phone]  Physical Therapy Treatment  Patient Details  Name: Sarah [name] MRN: [id number] Date of Birth: [birth year] Referring Provider: Danella Sensing, NP  Encounter Date: 08/07/2016      PT End of Session - 08/08/16 1118    Visit Number 6   Date for PT Re-Evaluation 09/01/16   Authorization Type [employer] Medicare   Authorization Time Period 07-03-16 - 09-01-16   PT Start Time 1101   PT Stop Time 1147   PT Time Calculation (min) 46 min   Equipment Utilized During Treatment Gait belt      Past Medical History:  Diagnosis Date  . 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 Chronic back pain   . DDD (degenerative disc disease), cervical   . DJD (degenerative joint disease)   . History of rectal fissure   . HIT (heparin-induced thrombocytopenia) ([place])   . Hypertension   . Obesity   . 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 Pulmonary embolus, right ([place]) 2015  . Rotator cuff tendonitis right    Past Surgical History:  Procedure Laterality Date  . LAMINECTOMY N/A 12/14/2013   Procedure: THORACIC LAMINECTOMY FOR TUMOR THORACIC THREE;  Surgeon: Ashok Pall, MD;  Location: [place] NEUR[facility] ORS;  Service: Neurosurgery;  Laterality: N/A;  THORACIC LAMINECTOMY FOR TUMOR THORACIC THREE  . LAMINECTOMY N/A 07/05/2015   Procedure: THORACIC LAMINECTOMY FOR TUMOR;  Surgeon: Ashok Pall, MD;  Location: [place] [facility]UR[facility] ORS;  Service: Neurosurgery;  Laterality: N/A;  . POSTERIOR LUMBAR FUSION 4 LEVEL N/A 12/30/2013   Procedure: Thoracic one-Thoracic five posterior thoracic fusion with pedicle screws;  Surgeon: Ashok Pall, MD;  Location: [place] NEURO ORS;  Service: Neurosurgery;  Laterality: N/A;  Thoracic one-Thoracic five posterior thoracic fusion with pedicle screws  . THYROIDECTOMY N/A 01/09/2014   Procedure: TOTAL THYROIDECTOMY;   Surgeon: Izora Gala, MD;  Location: [place];  Service: ENT;  Laterality: N/A;  . TONSILLECTOMY      There were no vitals filed for this visit.      Subjective Assessment - 08/07/16 1305    Subjective Pt states she was able to perform standing at home at counter with sink to the side (not facing it) and had a chair on the left side   Pertinent History thoracic laminectomy for spinal tumor 12-14-13; 07-05-15 for 2nd sx due to tumor regrowth:  DDD; Rt RTC tendonitis; DDD and DJD:  chronic back pain   Patient Stated Goals get legs stronger and to walk   Currently in Pain? No/denies                         [facility] Adult PT Treatment/Exercise - 08/08/16 0001      Ambulation/Gait   Ambulation/Gait Yes   Ambulation/Gait Assistance 4: Min assist  +2 for safety   Ambulation/Gait Assistance Details cues to extend hips and shoulders to stand more erect   Ambulation Distance (Feet) 10 Feet  4 reps   Assistive device Parallel bars      Pt able to transfer sit to stand from wheelchair to // bars with min to mod assist, depending on fatigue  Pt amb. 2 reps backwards inside // bars with +2 min to mod assist with cues for weight shifting  Pt gait trained with RW 5' with +2 mod to min assist with  cues for posture and weight shifting Pt required +2 mod assist for sit to stand from wheelchair to RW             PT Short Term Goals - 07/25/16 1114      PT SHORT TERM GOAL #1   Title Pt. will transfer without sliding board from mat to wheelchair with SBA.                08-02-16 TARGET DATE   Baseline met 09-18-14   Time 4   Period Weeks   Status New     PT SHORT TERM GOAL #2   Title Pt will perform sit to stand from wheelchair to // bars with +1 min assist.       08-02-16   Time 4   Period Weeks   Status New     PT SHORT TERM GOAL #3   Title Independent in HEP for LE strengthening and stretching.                        08-02-16   Time 4   Period Weeks   Status New      PT SHORT TERM GOAL #4   Title Pt will stand inside // bars for 3" with bil. UE support with +1 min assist.   08-02-16   Time 4   Period Weeks   Status New     PT SHORT TERM GOAL #5   Title Incr. endurance/activity tolerance so pt. able to perform 10' nonstop on recumbent bike ([employer])               08-02-16   Time 4   Period Weeks   Status New     PT SHORT TERM GOAL #6   Title Obtain consult for bil. KAFO's for LE's.   08-02-16;   met 07-24-16   Time 4   Status Achieved     PT SHORT TERM GOAL #7   Title Stand for 3" at counter for LE weight-bearing with +1 min assist.  (06-29-15)   Time 4   Period Weeks   Status New           PT Long Term Goals - 07/04/16 1403      PT LONG TERM GOAL #1   Title Transfer wheelchair to/from mat with SBA without sliding board modified independently    09-01-16 TARGET DATE   Time 8   Period Weeks   Status New     PT LONG TERM GOAL #2   Title Stand at countertop (or at trampoline at home) modified independently for at least 5" per pt report to demo increased LE strength and improved standing tolerance     PT LONG TERM GOAL #4   Title Assess gait with orthoses if obtained.          09-01-16 TARGET DATE   Time 8   Period Weeks   Status New     PT LONG TERM GOAL #5   Title Pt will tolerate rolling prone and maintaining position for at least 10 mins for stretching hip flexors.   09-01-16 TARGET DATE   Time 8   Period Weeks   Status New               Plan - 08/08/16 1118    Clinical Impression Statement Pt amb. backwards 10' inside // bars for 1st time today with moderate weight bearing required on bil. UE's:  pt also  amb. 5' with RW with +2 mod to min assist - this distance is furthest distance that pt has amb. with RW  (not with // bars)   Rehab Potential Good   Clinical Impairments Affecting Rehab Potential severity of impairments and length of time since onset   PT Frequency 2x / week   PT Duration 8 weeks   PT Treatment/Interventions  ADLs/Self Care Home Management;Therapeutic exercise;Balance training;Therapeutic activities;Functional mobility training;Gait training;Neuromuscular re-education;Patient/family education;DME Instruction;Wheelchair mobility training;Passive range of motion   PT Next Visit Plan check STG's   PT Home Exercise Plan stretches and LE strengthening   Consulted and Agree with Plan of Care Patient      Patient will benefit from skilled therapeutic intervention in order to improve the following deficits and impairments:  Abnormal gait, Difficulty walking, Decreased balance, Decreased activity tolerance, Impaired tone, Decreased strength  Visit Diagnosis: Other abnormalities of gait and mobility  Muscle weakness (generalized)     Problem List Patient Active Problem List   Diagnosis Date Noted  . Bilateral rotator cuff syndrome 02/12/2016  . E. coli UTI 08/01/2015  . Constipation due to neurogenic bowel 08/01/2015  . Muscle spasticity   . 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 Spastic paraparesis   . Neurogenic bladder   . History of pulmonary embolism   . Post-operative pain   . Metastatic cancer ([place])   . Steroid-induced hyperglycemia   . Benign essential HTN   . Acute blood loss anemia   . Leukocytosis   . Depression   . Obstipation   . Thoracic spine tumor 07/06/2015  . 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 Cord compression ([place])   . Encounter for intubation   . Lower extremity weakness   . Respiratory failure ([place])   . Postoperative hypothyroidism   . Peripheral edema 06/07/2015  . Hypothyroidism, postop 03/08/2015  . Secondary malignant neoplasm of vertebral column ([place]) 03/08/2015  . 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 Dysuria 09/04/2014  . Hypertension 05/01/2014  . Ingrown right big toenail 05/01/2014  . Hypokalemia 05/01/2014  . GERD (gastroesophageal reflux disease) 02/14/2014  . Dysphagia, pharyngoesophageal phase 02/06/2014  . Type 2 diabetes  mellitus without complication ([place]) 50/27/7412  . Constipation   . Neurogenic bowel   . 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 ([place]) 01/20/2014  . Metastatic cancer to spine ([place]) 01/19/2014  . Rotator cuff tendonitis   . Acute postoperative respiratory failure ([place]) 01/09/2014  . 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 Leg weakness, bilateral 12/13/2013    [place], PT 08/08/2016, [facility] [address] [place], [place], [address] Phone: [phone]   Fax:  [phone]  Name: Sarah [name] MRN: [id number] Date of Birth: Mar 28, 1965

## 2016-08-11 ENCOUNTER — Ambulatory Visit: Payer: Medicare Other | Admitting: Physical Therapy

## 2016-08-11 DIAGNOSIS — M6281 Muscle weakness (generalized): Secondary | ICD-10-CM | POA: Diagnosis not present

## 2016-08-11 DIAGNOSIS — R2689 Other abnormalities of gait and mobility: Secondary | ICD-10-CM | POA: Diagnosis not present

## 2016-08-12 ENCOUNTER — Encounter: Payer: Medicare Other | Attending: Physical Medicine & Rehabilitation | Admitting: Physical Medicine & Rehabilitation

## 2016-08-12 ENCOUNTER — Encounter: Payer: Self-pay | Admitting: Physical Medicine & Rehabilitation

## 2016-08-12 VITALS — BP 147/91 | HR 83 | Resp 14

## 2016-08-12 DIAGNOSIS — R29898 Other symptoms and signs involving the musculoskeletal system: Secondary | ICD-10-CM

## 2016-08-12 DIAGNOSIS — Z5189 Encounter for other specified aftercare: Secondary | ICD-10-CM | POA: Insufficient documentation

## 2016-08-12 DIAGNOSIS — R1314 Dysphagia, pharyngoesophageal phase: Secondary | ICD-10-CM | POA: Diagnosis not present

## 2016-08-12 DIAGNOSIS — M25512 Pain in left shoulder: Secondary | ICD-10-CM | POA: Insufficient documentation

## 2016-08-12 DIAGNOSIS — C73 Malignant neoplasm of thyroid gland: Secondary | ICD-10-CM | POA: Insufficient documentation

## 2016-08-12 DIAGNOSIS — M109 Gout, unspecified: Secondary | ICD-10-CM | POA: Insufficient documentation

## 2016-08-12 DIAGNOSIS — C7951 Secondary malignant neoplasm of bone: Secondary | ICD-10-CM | POA: Insufficient documentation

## 2016-08-12 DIAGNOSIS — Z9889 Other specified postprocedural states: Secondary | ICD-10-CM | POA: Insufficient documentation

## 2016-08-12 DIAGNOSIS — G8220 Paraplegia, unspecified: Secondary | ICD-10-CM | POA: Diagnosis not present

## 2016-08-12 DIAGNOSIS — M25511 Pain in right shoulder: Secondary | ICD-10-CM | POA: Insufficient documentation

## 2016-08-12 DIAGNOSIS — R252 Cramp and spasm: Secondary | ICD-10-CM | POA: Diagnosis not present

## 2016-08-12 DIAGNOSIS — Z86711 Personal history of pulmonary embolism: Secondary | ICD-10-CM | POA: Insufficient documentation

## 2016-08-12 DIAGNOSIS — N319 Neuromuscular dysfunction of bladder, unspecified: Secondary | ICD-10-CM | POA: Diagnosis not present

## 2016-08-12 MED ORDER — PANTOPRAZOLE SODIUM 40 MG PO TBEC: 40.0000 mg | DELAYED_RELEASE_TABLET | Freq: Every day | ORAL | 4 refills | Status: DC

## 2016-08-12 MED ORDER — ONDANSETRON HCL 4 MG PO TABS: 4.0000 mg | ORAL_TABLET | Freq: Four times a day (QID) | ORAL | 4 refills | Status: DC

## 2016-08-12 NOTE — Patient Instructions (Signed)
CONTACT [employer] ABOUT OPTIONS FOR A POTENTIAL SCOOTER

## 2016-08-12 NOTE — Progress Notes (Signed)
Subjective:    Patient ID: Sarah [name], female    DOB: 14-Oct-1965, 51 y.o.   MRN: [id number]  HPI  Sarah [name] is here in follow up of her thoracic spine tumor and paraplegia. She is walking now with PT. She is using the electric w/c in the house but is wanting something which is more mobile for outside the home and is easier to transport.   Her pain is minimal.  Pain Inventory Average Pain 1 Pain Right Now 1 My pain is tingling  In the last 24 hours, has pain interfered with the following? General activity 0 Relation with others 0 Enjoyment of life 0 What TIME of day is your pain at its worst? morning, night Sleep (in general) Good  Pain is worse with: sitting and inactivity Pain improves with: rest and medication Relief from Meds: n/a  Mobility walk without assistance walk with assistance use a cane use a walker ability to climb steps?  no do you drive?  no use a wheelchair transfers alone Do you have any goals in this area?  yes  Function disabled: date disabled . I need assistance with the following:  bathing and shopping  Neuro/Psych numbness tingling trouble walking spasms  Prior Studies Any changes since last visit?  no  Physicians involved in your care Any changes since last visit?  no   Family History  Problem Relation Age of Onset  . Hypertension Mother   . Diabetes Mother   . Hypertension Father   . Stroke Father   . Diabetes Father   . Diabetes Sister   . Diabetes Sister    Social History   Social History  . Marital status: Single    Spouse name: N/A  . Number of children: N/A  . Years of education: N/A   Social History Main Topics  . Smoking status: Never Smoker  . Smokeless tobacco: Never Used  . Alcohol use No  . Drug use: No  . Sexual activity: Not Currently   Other Topics Concern  . None   Social History Narrative  . None   Past Surgical History:  Procedure Laterality Date  . LAMINECTOMY N/A 12/14/2013   Procedure:  THORACIC LAMINECTOMY FOR TUMOR THORACIC THREE;  Surgeon: Ashok Pall, MD;  Location: [place] NEUR[facility] ORS;  Service: Neurosurgery;  Laterality: N/A;  THORACIC LAMINECTOMY FOR TUMOR THORACIC THREE  . LAMINECTOMY N/A 07/05/2015   Procedure: THORACIC LAMINECTOMY FOR TUMOR;  Surgeon: Ashok Pall, MD;  Location: [place] NEURO ORS;  Service: Neurosurgery;  Laterality: N/A;  . POSTERIOR LUMBAR FUSION 4 LEVEL N/A 12/30/2013   Procedure: Thoracic one-Thoracic five posterior thoracic fusion with pedicle screws;  Surgeon: Ashok Pall, MD;  Location: [place] NEURO ORS;  Service: Neurosurgery;  Laterality: N/A;  Thoracic one-Thoracic five posterior thoracic fusion with pedicle screws  . THYROIDECTOMY N/A 01/09/2014   Procedure: TOTAL THYROIDECTOMY;  Surgeon: Izora Gala, MD;  Location: [place];  Service: ENT;  Laterality: N/A;  . TONSILLECTOMY     Past Medical History:  Diagnosis Date  . 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 Chronic back pain   . DDD (degenerative disc disease), cervical   . DJD (degenerative joint disease)   . History of rectal fissure   . HIT (heparin-induced thrombocytopenia) ([place])   . Hypertension   . Obesity   . 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 Pulmonary embolus, right ([place]) 2015  . Rotator cuff tendonitis right  BP (!) 147/91 (BP Location: Left Arm, Patient Position: Sitting, Cuff Size: Large)   Pulse 83   Resp 14   SpO2 98%   Opioid Risk Score:   Fall Risk Score:  `1  Depression screen PHQ 2/9  Depression screen [facility] 2/9 08/05/2016 02/12/2016 11/13/2015 09/04/2015 12/26/2014 09/19/2014  Decreased Interest 0 0 0 0 0 0  Down, Depressed, Hopeless 0 0 0 0 1 1  PHQ - 2 Score 0 0 0 0 1 1  Altered sleeping - - - 0 - 0  Tired, decreased energy - - - 0 - 1  Change in appetite - - - 0 - 0  Feeling bad or failure about yourself  - - - 0 - 0  Trouble concentrating - - - 0 - 0  Moving slowly or fidgety/restless - - - 0 - 0  Suicidal thoughts - - - 0 - 0    PHQ-9 Score - - - 0 - 2  Difficult doing work/chores - - - Not difficult at all - -  Some recent data might be hidden    Review of Systems  Constitutional: Negative.   HENT: Negative.   Eyes: Negative.   Respiratory: Negative.   Cardiovascular: Negative.   Gastrointestinal: Negative.   Endocrine: Negative.   Genitourinary: Negative.   Musculoskeletal: Positive for gait problem.       Spasms   Skin: Negative.   Allergic/Immunologic: Negative.   Neurological: Positive for numbness.       Tingling  Hematological: Negative.   Psychiatric/Behavioral: Negative.   All other systems reviewed and are negative.      Objective:   Physical Exam  Constitutional: She appears well-developed. NAD. Obese HENT: NCAT.  Eyes: EOMI  Cardiovascular: RRR. Respiratory: CTA B .  GI: soft BS +.  Musculoskeletal: MTP of first right toe red and swollen, very tender to touch. Shoulders with mild impingement signs bilaterally Neurological: She is alert and oriented.  Flat but speech clear and able to follow commands without difficulty.  BUE 5/5 proximal to distal.  RLE: HF, KE 3/5, ankle dorsi/plantar flexion remain 4/5 LLE: 3-/5 to 3/5 proximal to distal 4/5. Still weaker in hips and trunk Extensor tone in lower extremities approximately tr/4 Skin: Skin is warm and dry.   [name] Kitchen Psychiatric: pleasant and appropriate as always   Assessment & Plan:  1. 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   -provided rx for scooter. She will discuss with [employer] regarding some sort of trade back for her powered chair. 2. H/o PE/Anticoagulation: Pharmaceutical: Xarelto, 3. Pain Management: Controlled currently on oxycodone prn.   4. Metastatic cancer with T3 pathologic FX with cord compression and new osseous mets T12-  L3/S1:  -per onc 5. Spasticity: increasing tone on exam -Continue baclofen 20 mg 3 X day with valium prn.   6. Neurogenic bowel :   -colace q am   7. Neurogenic bladder: emptying bladder 8. Gout right first toe: colchicine prn 9. Bilateral shoulder pain---improving   15 minutes of face to face patient care time were spent during this visit. All questions were encouraged and answered.

## 2016-08-13 ENCOUNTER — Telehealth: Payer: Self-pay | Admitting: *Deleted

## 2016-08-13 ENCOUNTER — Ambulatory Visit: Payer: Medicare Other | Admitting: Hematology

## 2016-08-13 ENCOUNTER — Other Ambulatory Visit: Payer: Medicare Other

## 2016-08-13 ENCOUNTER — Telehealth: Payer: Self-pay | Admitting: Hematology

## 2016-08-13 NOTE — Telephone Encounter (Signed)
Attempted to call pt no answer, will try again later. Deseree Kennon Holter, CMA

## 2016-08-13 NOTE — Telephone Encounter (Signed)
She says she stopped taking HCTZ because she was allergic to it. She cannot come into to see Dr Alease Frame tomorrow because of her lack of transportation.  She may be able to come next week.  Please advise

## 2016-08-13 NOTE — Telephone Encounter (Signed)
Patient called to cancel her appointment today and would call back to reschedule.  She has a miagraine and is trying to get rid of it. She said she was sick on her stomach

## 2016-08-13 NOTE — Telephone Encounter (Signed)
Patient states she stopped taking the hctz that was prescribed by Dr. Juleen China because it was making her sick. States she noticed that when she stopped taking it her blood pressure has been running high but not as high as it was at her appointment (states she is still taking) the lisinopril. Wants to know if MD can send in something other than the hctz.

## 2016-08-13 NOTE — Telephone Encounter (Signed)
I would like to have her in to get this under control. Per the recent telephone note to Oncology it seems like patient has a headache. This might be a result of her HTN.  Patient was due for a f/u 1 week after the last visit which was never scheduled. Please see if patient can come in later today or tomorrow (I have [facility] tomorrow PM)

## 2016-08-13 NOTE — Therapy (Signed)
[facility] [address] [place], [place], [address] Phone: [phone]   Fax:  [phone]  Physical Therapy Treatment  Patient Details  Name: Sarah [name] MRN: [id number] Date of Birth: 05-02-1965 Referring Provider: Danella Sensing, NP  Encounter Date: 08/11/2016      PT End of Session - 08/13/16 0833    Visit Number 7   Date for PT Re-Evaluation 09/01/16   Authorization Type UHC Medicare   Authorization Time Period 07-03-16 - 09-01-16   PT Start Time 1406   PT Stop Time 1450   PT Time Calculation (min) 44 min   Equipment Utilized During Treatment Gait belt      Past Medical History:  Diagnosis Date  . 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 Chronic back pain   . DDD (degenerative disc disease), cervical   . DJD (degenerative joint disease)   . History of rectal fissure   . HIT (heparin-induced thrombocytopenia) ([place])   . Hypertension   . Obesity   . 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 ([place])   . Pulmonary embolus, right ([place]) 2015  . Rotator cuff tendonitis right    Past Surgical History:  Procedure Laterality Date  . LAMINECTOMY N/A 12/14/2013   Procedure: THORACIC LAMINECTOMY FOR TUMOR THORACIC THREE;  Surgeon: Ashok Pall, MD;  Location: [place] [facility];  Service: Neurosurgery;  Laterality: N/A;  THORACIC LAMINECTOMY FOR TUMOR THORACIC THREE  . LAMINECTOMY N/A 07/05/2015   Procedure: THORACIC LAMINECTOMY FOR TUMOR;  Surgeon: Ashok Pall, MD;  Location: [place] [facility]UR[facility];  Service: Neurosurgery;  Laterality: N/A;  . POSTERIOR LUMBAR FUSION 4 LEVEL N/A 12/30/2013   Procedure: Thoracic one-Thoracic five posterior thoracic fusion with pedicle screws;  Surgeon: Ashok Pall, MD;  Location: [place] [facility]UR[facility];  Service: Neurosurgery;  Laterality: N/A;  Thoracic one-Thoracic five posterior thoracic fusion with pedicle screws  . THYROIDECTOMY N/A 01/09/2014   Procedure: TOTAL THYROIDECTOMY;   Surgeon: Izora Gala, MD;  Location: [place];  Service: ENT;  Laterality: N/A;  . TONSILLECTOMY      There were no vitals filed for this visit.      Subjective Assessment - 08/13/16 0830    Subjective Pt reports she did not do alot of standing over the weekend due to her sister going out of town and not being there to help her    Pertinent History thoracic laminectomy for spinal tumor 12-14-13; 07-05-15 for 2nd sx due to tumor regrowth:  DDD; Rt RTC tendonitis; DDD and DJD:  chronic back pain   Patient Stated Goals get legs stronger and to walk   Currently in Pain? No/denies                         [facility] Adult PT Treatment/Exercise - 08/13/16 0001      Ambulation/Gait   Ambulation/Gait Yes   Ambulation/Gait Assistance 4: Min assist  +2 for safety   Ambulation/Gait Assistance Details cues to extend hips and keep shoulders back for upright posture   Ambulation Distance (Feet) 10 Feet  3 reps   Assistive device Parallel bars      Pt gait trained forward 10' inside // bars with +1 min assist, then backwards back to wheelchair with min assist +1 -- 3 reps inside bars  Pt gait trained with RW 8' x 1 and then 10' x 1 with +1 min to mod assist with 2nd person following behind with wheelchair  Pt requires mod assist +2 for sit to stand transfer from wheelchair            PT Short Term Goals - 07/25/16 1114      PT SHORT TERM GOAL #1   Title Pt. will transfer without sliding board from mat to wheelchair with SBA.                08-02-16 TARGET DATE   Baseline met 09-18-14   Time 4   Period Weeks   Status New     PT SHORT TERM GOAL #2   Title Pt will perform sit to stand from wheelchair to // bars with +1 min assist.       08-02-16   Time 4   Period Weeks   Status New     PT SHORT TERM GOAL #3   Title Independent in HEP for LE strengthening and stretching.                        08-02-16   Time 4   Period Weeks   Status New     PT SHORT TERM GOAL #4    Title Pt will stand inside // bars for 3" with bil. UE support with +1 min assist.   08-02-16   Time 4   Period Weeks   Status New     PT SHORT TERM GOAL #5   Title Incr. endurance/activity tolerance so pt. able to perform 10' nonstop on recumbent bike (Scifit)               08-02-16   Time 4   Period Weeks   Status New     PT SHORT TERM GOAL #6   Title Obtain consult for bil. KAFO's for LE's.   08-02-16;   met 07-24-16   Time 4   Status Achieved     PT SHORT TERM GOAL #7   Title Stand for 3" at counter for LE weight-bearing with +1 min assist.  (06-29-15)   Time 4   Period Weeks   Status New           PT Long Term Goals - 07/04/16 1403      PT LONG TERM GOAL #1   Title Transfer wheelchair to/from mat with SBA without sliding board modified independently    09-01-16 TARGET DATE   Time 8   Period Weeks   Status New     PT LONG TERM GOAL #2   Title Stand at countertop (or at trampoline at home) modified independently for at least 5" per pt report to demo increased LE strength and improved standing tolerance     PT LONG TERM GOAL #4   Title Assess gait with orthoses if obtained.          09-01-16 TARGET DATE   Time 8   Period Weeks   Status New     PT LONG TERM GOAL #5   Title Pt will tolerate rolling prone and maintaining position for at least 10 mins for stretching hip flexors.   09-01-16 TARGET DATE   Time 8   Period Weeks   Status New               Plan - 08/13/16 8250    Clinical Impression Statement Pt continues to progress with ambulation - pt amb. furthest distance with RW today - 8' and 10' x 2 reps with +1 min to mod assist; excessive UE weight  bearing on RW noted   Rehab Potential Good   Clinical Impairments Affecting Rehab Potential severity of impairments and length of time since onset   PT Frequency 2x / week   PT Duration 8 weeks   PT Treatment/Interventions ADLs/Self Care Home Management;Therapeutic exercise;Balance training;Therapeutic  activities;Functional mobility training;Gait training;Neuromuscular re-education;Patient/family education;DME Instruction;Wheelchair mobility training;Passive range of motion   PT Next Visit Plan check STG's   PT Home Exercise Plan stretches and LE strengthening   Consulted and Agree with Plan of Care Patient      Patient will benefit from skilled therapeutic intervention in order to improve the following deficits and impairments:  Abnormal gait, Difficulty walking, Decreased balance, Decreased activity tolerance, Impaired tone, Decreased strength  Visit Diagnosis: Muscle weakness (generalized)  Other abnormalities of gait and mobility     Problem List Patient Active Problem List   Diagnosis Date Noted  . Bilateral rotator cuff syndrome 02/12/2016  . E. coli UTI 08/01/2015  . Constipation due to neurogenic bowel 08/01/2015  . Muscle spasticity   . 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 Spastic paraparesis   . Neurogenic bladder   . History of pulmonary embolism   . Post-operative pain   . Metastatic cancer ([place])   . Steroid-induced hyperglycemia   . Benign essential HTN   . Acute blood loss anemia   . Leukocytosis   . Depression   . Obstipation   . Thoracic spine tumor 07/06/2015  . 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 Cord compression ([place])   . Encounter for intubation   . Lower extremity weakness   . Respiratory failure ([place])   . Postoperative hypothyroidism   . Peripheral edema 06/07/2015  . Hypothyroidism, postop 03/08/2015  . 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 Spasticity 09/19/2014  . Dysuria 09/04/2014  . Hypertension 05/01/2014  . Ingrown right big toenail 05/01/2014  . Hypokalemia 05/01/2014  . GERD (gastroesophageal reflux disease) 02/14/2014  . Dysphagia, pharyngoesophageal phase 02/06/2014  . Type 2 diabetes mellitus without complication ([place]) 31/67/4255  . Constipation   . Neurogenic bowel   .  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 ([place]) 01/20/2014  . Metastatic cancer to spine ([place]) 01/19/2014  . Rotator cuff tendonitis   . Acute postoperative respiratory failure ([place]) 01/09/2014  . 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 Leg weakness, bilateral 12/13/2013    [id number], XAFHS VEXOGAC, PT 08/13/2016, 8:37 AM  [facility] [address] [place], [place], [address] Phone: [phone]   Fax:  [phone]  Name: Sarah [name] MRN: [id number] Date of Birth: Nov 20, 1965

## 2016-08-13 NOTE — Telephone Encounter (Signed)
As long as she knows I'd like to see her then that is ok.  Lets just get this taken care of sooner rather than later.

## 2016-08-13 NOTE — Telephone Encounter (Signed)
Routing to PCP and prescribing provider. Ottis Stain, CMA

## 2016-08-14 ENCOUNTER — Other Ambulatory Visit: Payer: Self-pay | Admitting: Registered Nurse

## 2016-08-14 ENCOUNTER — Ambulatory Visit: Payer: Medicare Other | Admitting: Hematology

## 2016-08-14 ENCOUNTER — Other Ambulatory Visit: Payer: Medicare Other

## 2016-08-19 ENCOUNTER — Ambulatory Visit: Payer: Medicare Other | Admitting: Physical Therapy

## 2016-08-21 ENCOUNTER — Ambulatory Visit: Payer: Medicare Other | Admitting: Physical Therapy

## 2016-08-21 DIAGNOSIS — M6281 Muscle weakness (generalized): Secondary | ICD-10-CM | POA: Diagnosis not present

## 2016-08-21 DIAGNOSIS — R2689 Other abnormalities of gait and mobility: Secondary | ICD-10-CM

## 2016-08-21 NOTE — Therapy (Signed)
[facility] [address] [place], [place], [address] Phone: [phone]   Fax:  [phone]  Physical Therapy Treatment  Patient Details  Name: Sarah [name] MRN: [id number] Date of Birth: 1966/02/13 Referring Provider: Danella Sensing, NP  Encounter Date: 08/21/2016      PT End of Session - 08/21/16 2113    Visit Number 8   Date for PT Re-Evaluation 09/01/16   Authorization Type UHC Medicare   Authorization Time Period 07-03-16 - 09-01-16   PT Start Time 1105   PT Stop Time 1150   PT Time Calculation (min) 45 min   Equipment Utilized During Treatment Gait belt      Past Medical History:  Diagnosis Date  . Cancer 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 Chronic back pain   . DDD (degenerative disc disease), cervical   . DJD (degenerative joint disease)   . History of rectal fissure   . HIT (heparin-induced thrombocytopenia) ([place])   . Hypertension   . Obesity   . 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 Pulmonary embolus, right ([place]) 2015  . Rotator cuff tendonitis right    Past Surgical History:  Procedure Laterality Date  . LAMINECTOMY N/A 12/14/2013   Procedure: THORACIC LAMINECTOMY FOR TUMOR THORACIC THREE;  Surgeon: Ashok Pall, MD;  Location: [place] [facility];  Service: Neurosurgery;  Laterality: N/A;  THORACIC LAMINECTOMY FOR TUMOR THORACIC THREE  . LAMINECTOMY N/A 07/05/2015   Procedure: THORACIC LAMINECTOMY FOR TUMOR;  Surgeon: Ashok Pall, MD;  Location: [facility] [facility];  Service: Neurosurgery;  Laterality: N/A;  . POSTERIOR LUMBAR FUSION 4 LEVEL N/A 12/30/2013   Procedure: Thoracic one-Thoracic five posterior thoracic fusion with pedicle screws;  Surgeon: Ashok Pall, MD;  Location: [place] [facility];  Service: Neurosurgery;  Laterality: N/A;  Thoracic one-Thoracic five posterior thoracic fusion with pedicle screws  . THYROIDECTOMY N/A 01/09/2014   Procedure: TOTAL THYROIDECTOMY;   Surgeon: Izora Gala, MD;  Location: [facility];  Service: ENT;  Laterality: N/A;  . TONSILLECTOMY      There were no vitals filed for this visit.      Subjective Assessment - 08/21/16 2106    Subjective pt states she has not been standing very much at home - "I know I need to - I've got to start doing it more"   [name] accompained by: Family member   Pertinent History thoracic laminectomy for spinal tumor 12-14-13; 07-05-15 for 2nd sx due to tumor regrowth:  DDD; Rt RTC tendonitis; DDD and DJD:  chronic back pain   Patient Stated Goals get legs stronger and to walk   Currently in Pain? No/denies                         [facility] Adult PT Treatment/Exercise - 08/21/16 0001      Transfers   Transfers Sit to Stand   Sit to Stand 3: Mod assist   Number of Reps Other reps (comment)  3     Ambulation/Gait   Ambulation/Gait Yes   Ambulation/Gait Assistance 4: Min assist  +2 for safety   Ambulation/Gait Assistance Details cues to tuck hips and bring shoulders back   Ambulation Distance (Feet) 10 Feet  12   Assistive device Rolling walker        Pt gait trained in // bars at start of session 10' x 1, then backward 10' x 1 with CGA to  min A  Pt stood  At counter - holding on at sink - with mod to max assist - 60 secs x 2 reps; cues to extend hips and keep  Shoulders back for upright posture         PT Short Term Goals - 07/25/16 1114      PT SHORT TERM GOAL #1   Title Pt. will transfer without sliding board from mat to wheelchair with SBA.                08-02-16 TARGET DATE   Baseline met 09-18-14   Time 4   Period Weeks   Status New     PT SHORT TERM GOAL #2   Title Pt will perform sit to stand from wheelchair to // bars with +1 min assist.       08-02-16   Time 4   Period Weeks   Status New     PT SHORT TERM GOAL #3   Title Independent in HEP for LE strengthening and stretching.                        08-02-16   Time 4   Period Weeks   Status New      PT SHORT TERM GOAL #4   Title Pt will stand inside // bars for 3" with bil. UE support with +1 min assist.   08-02-16   Time 4   Period Weeks   Status New     PT SHORT TERM GOAL #5   Title Incr. endurance/activity tolerance so pt. able to perform 10' nonstop on recumbent bike (Scifit)               08-02-16   Time 4   Period Weeks   Status New     PT SHORT TERM GOAL #6   Title Obtain consult for bil. KAFO's for LE's.   08-02-16;   met 07-24-16   Time 4   Status Achieved     PT SHORT TERM GOAL #7   Title Stand for 3" at counter for LE weight-bearing with +1 min assist.  (06-29-15)   Time 4   Period Weeks   Status New           PT Long Term Goals - 07/04/16 1403      PT LONG TERM GOAL #1   Title Transfer wheelchair to/from mat with SBA without sliding board modified independently    09-01-16 TARGET DATE   Time 8   Period Weeks   Status New     PT LONG TERM GOAL #2   Title Stand at countertop (or at trampoline at home) modified independently for at least 5" per pt report to demo increased LE strength and improved standing tolerance     PT LONG TERM GOAL #4   Title Assess gait with orthoses if obtained.          09-01-16 TARGET DATE   Time 8   Period Weeks   Status New     PT LONG TERM GOAL #5   Title Pt will tolerate rolling prone and maintaining position for at least 10 mins for stretching hip flexors.   09-01-16 TARGET DATE   Time 8   Period Weeks   Status New               Plan - 08/21/16 2113    Clinical Impression Statement Pt able to stand at sink with mod  assist with pt pulling up with bil. UE's; increased tone noted in bil. LE's with feet more plantarflexed during gait; pt progressing slowly with increasing amb. distance   Rehab Potential Good   Clinical Impairments Affecting Rehab Potential severity of impairments and length of time since onset   PT Frequency 2x / week   PT Duration 8 weeks   PT Treatment/Interventions ADLs/Self Care Home  Management;Therapeutic exercise;Balance training;Therapeutic activities;Functional mobility training;Gait training;Neuromuscular re-education;Patient/family education;DME Instruction;Wheelchair mobility training;Passive range of motion   PT Next Visit Plan check STG's   PT Home Exercise Plan stretches and LE strengthening   Consulted and Agree with Plan of Care Patient      Patient will benefit from skilled therapeutic intervention in order to improve the following deficits and impairments:  Abnormal gait, Difficulty walking, Decreased balance, Decreased activity tolerance, Impaired tone, Decreased strength  Visit Diagnosis: Muscle weakness (generalized)  Other abnormalities of gait and mobility     Problem List Patient Active Problem List   Diagnosis Date Noted  . Bilateral rotator cuff syndrome 02/12/2016  . E. coli UTI 08/01/2015  . Constipation due to neurogenic bowel 08/01/2015  . Muscle spasticity   . 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 Spastic paraparesis   . Neurogenic bladder   . History of pulmonary embolism   . Post-operative pain   . Metastatic cancer ([place])   . Steroid-induced hyperglycemia   . Benign essential HTN   . Acute blood loss anemia   . Leukocytosis   . Depression   . Obstipation   . Thoracic spine tumor 07/06/2015  . 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 Cord compression ([place])   . Encounter for intubation   . Lower extremity weakness   . Respiratory failure ([place])   . Postoperative hypothyroidism   . Peripheral edema 06/07/2015  . Hypothyroidism, postop 03/08/2015  . 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 Spasticity 09/19/2014  . Dysuria 09/04/2014  . Hypertension 05/01/2014  . Ingrown right big toenail 05/01/2014  . Hypokalemia 05/01/2014  . GERD (gastroesophageal reflux disease) 02/14/2014  . Dysphagia, pharyngoesophageal phase 02/06/2014  . Type 2 diabetes mellitus without complication  ([place]) 54/30/1484  . Constipation   . Neurogenic bowel   . 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 ([place]) 01/20/2014  . Metastatic cancer to spine ([place]) 01/19/2014  . Rotator cuff tendonitis   . Acute postoperative respiratory failure ([place]) 01/09/2014  . 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 Leg weakness, bilateral 12/13/2013    [place], PT 08/21/2016, 9:18 PM  [facility] [address] [place], [place], [address] Phone: [phone]   Fax:  [phone]  Name: Sarah [name] MRN: [id number] Date of Birth: Aug 05, 1965

## 2016-08-26 ENCOUNTER — Ambulatory Visit: Payer: Medicare Other | Attending: Registered Nurse | Admitting: Physical Therapy

## 2016-08-26 DIAGNOSIS — G8222 Paraplegia, incomplete: Secondary | ICD-10-CM | POA: Insufficient documentation

## 2016-08-26 DIAGNOSIS — M6281 Muscle weakness (generalized): Secondary | ICD-10-CM

## 2016-08-26 DIAGNOSIS — R2689 Other abnormalities of gait and mobility: Secondary | ICD-10-CM | POA: Diagnosis not present

## 2016-08-27 ENCOUNTER — Encounter: Payer: Self-pay | Admitting: Physical Therapy

## 2016-08-27 NOTE — Therapy (Signed)
[facility] [address] [place], [place], [address] Phone: [phone]   Fax:  [phone]  Physical Therapy Treatment  Patient Details  Name: Sarah [name] MRN: [id number] Date of Birth: 1965-06-17 Referring Provider: Danella Sensing, NP  Encounter Date: 08/26/2016      PT End of Session - 08/27/16 1456    Visit Number 9   Date for PT Re-Evaluation 09/01/16   Authorization Type [employer] Medicare   Authorization Time Period 07-03-16 - 09-01-16   PT Start Time 1020   PT Stop Time 1105   PT Time Calculation (min) 45 min   Equipment Utilized During Treatment Gait belt      Past Medical History:  Diagnosis Date  . 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 Chronic back pain   . DDD (degenerative disc disease), cervical   . DJD (degenerative joint disease)   . History of rectal fissure   . HIT (heparin-induced thrombocytopenia) ([place])   . Hypertension   . Obesity   . 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 Pulmonary embolus, right ([place]) 2015  . Rotator cuff tendonitis right    Past Surgical History:  Procedure Laterality Date  . LAMINECTOMY N/A 12/14/2013   Procedure: THORACIC LAMINECTOMY FOR TUMOR THORACIC THREE;  Surgeon: Ashok Pall, MD;  Location: [place] NEURO ORS;  Service: Neurosurgery;  Laterality: N/A;  THORACIC LAMINECTOMY FOR TUMOR THORACIC THREE  . LAMINECTOMY N/A 07/05/2015   Procedure: THORACIC LAMINECTOMY FOR TUMOR;  Surgeon: Ashok Pall, MD;  Location: [place] NEURO ORS;  Service: Neurosurgery;  Laterality: N/A;  . POSTERIOR LUMBAR FUSION 4 LEVEL N/A 12/30/2013   Procedure: Thoracic one-Thoracic five posterior thoracic fusion with pedicle screws;  Surgeon: Ashok Pall, MD;  Location: [place] NEURO ORS;  Service: Neurosurgery;  Laterality: N/A;  Thoracic one-Thoracic five posterior thoracic fusion with pedicle screws  . THYROIDECTOMY N/A 01/09/2014   Procedure: TOTAL THYROIDECTOMY;   Surgeon: Izora Gala, MD;  Location: [place];  Service: ENT;  Laterality: N/A;  . TONSILLECTOMY      There were no vitals filed for this visit.      Subjective Assessment - 08/27/16 1453    Subjective Pt states she took a few steps at home with her sister's and brother-in-law's assistance with RW   Pertinent History thoracic laminectomy for spinal tumor 12-14-13; 07-05-15 for 2nd sx due to tumor regrowth:  DDD; Rt RTC tendonitis; DDD and DJD:  chronic back pain   Patient Stated Goals get legs stronger and to walk   Currently in Pain? No/denies                         [facility] Adult PT Treatment/Exercise - 08/27/16 0001      Transfers   Transfers Sit to Stand   Sit to Stand 3: Mod assist   Number of Reps Other reps (comment)  4     Ambulation/Gait   Ambulation/Gait Yes   Ambulation/Gait Assistance 4: Min assist  +2 for safety   Ambulation/Gait Assistance Details cues to tuck hips and stand erect   Ambulation Distance (Feet) 30 Feet  30' x 2 reps; 10' x 1 rep to [employer] device Rolling walker     Pt transferred sit to stand from wheelchair to RW - switched out to regular chair so that left brake on wheelchair could be  Tightened;  Pt transferred from regular chair to  RW (for 1st time) with mod to max assist with pt pulling up on RW  Pt was weighed per her request - wheelchair weighs 46.4 - pt's weight 248#   Pt performed 5" on SciFit with bil. UE's and LE's - level 2.0           PT Short Term Goals - 08/27/16 1501      PT SHORT TERM GOAL #1   Title Pt. will transfer without sliding board from mat to wheelchair with SBA.                08-02-16 TARGET DATE   Status New     PT SHORT TERM GOAL #2   Title Pt will perform sit to stand from wheelchair to // bars with +1 min assist.       08-02-16   Status New     PT SHORT TERM GOAL #3   Title Independent in HEP for LE strengthening and stretching.                        08-02-16   Status New      PT SHORT TERM GOAL #4   Title Pt will stand inside // bars for 3" with bil. UE support with +1 min assist.   08-02-16   Status New     PT SHORT TERM GOAL #5   Title Incr. endurance/activity tolerance so pt. able to perform 10' nonstop on recumbent bike (Scifit)               08-02-16   Status New     PT SHORT TERM GOAL #6   Title Obtain consult for bil. KAFO's for LE's.   08-02-16;   met 07-24-16   Status Achieved           PT Long Term Goals - 07/04/16 1403      PT LONG TERM GOAL #1   Title Transfer wheelchair to/from mat with SBA without sliding board modified independently    09-01-16 TARGET DATE   Time 8   Period Weeks   Status New     PT LONG TERM GOAL #2   Title Stand at countertop (or at trampoline at home) modified independently for at least 5" per pt report to demo increased LE strength and improved standing tolerance     PT LONG TERM GOAL #4   Title Assess gait with orthoses if obtained.          09-01-16 TARGET DATE   Time 8   Period Weeks   Status New     PT LONG TERM GOAL #5   Title Pt will tolerate rolling prone and maintaining position for at least 10 mins for stretching hip flexors.   09-01-16 TARGET DATE   Time 8   Period Weeks   Status New               Plan - 08/27/16 1458    Clinical Impression Statement Pt amb. furthest distance to date today - 30' x 2 reps and then 10' from wheelchair to [place];  pt improving with sit to stand transfers with +1 mod assist needed; gait training performed with and without AFO's - no significant improvement in gait noted with use of AFO's - will cont. to assess   Rehab Potential Good   Clinical Impairments Affecting Rehab Potential severity of impairments and length of time since onset   PT Frequency 2x / week  PT Treatment/Interventions ADLs/Self Care Home Management;Therapeutic exercise;Balance training;Therapeutic activities;Functional mobility training;Gait training;Neuromuscular re-education;Patient/family  education;DME Instruction;Wheelchair mobility training;Passive range of motion   PT Next Visit Plan check STG's   PT Home Exercise Plan stretches and LE strengthening   Consulted and Agree with Plan of Care Patient      Patient will benefit from skilled therapeutic intervention in order to improve the following deficits and impairments:  Abnormal gait, Difficulty walking, Decreased balance, Decreased activity tolerance, Impaired tone, Decreased strength  Visit Diagnosis: Other abnormalities of gait and mobility  Muscle weakness (generalized)     Problem List Patient Active Problem List   Diagnosis Date Noted  . Bilateral rotator cuff syndrome 02/12/2016  . E. coli UTI 08/01/2015  . Constipation due to neurogenic bowel 08/01/2015  . Muscle spasticity   . 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 Spastic paraparesis   . Neurogenic bladder   . History of pulmonary embolism   . Post-operative pain   . Metastatic cancer ([place])   . Steroid-induced hyperglycemia   . Benign essential HTN   . Acute blood loss anemia   . Leukocytosis   . Depression   . Obstipation   . Thoracic spine tumor 07/06/2015  . 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 ([place]) 07/06/2015  . Cord compression ([place])   . Encounter for intubation   . Lower extremity weakness   . Respiratory failure ([place])   . Postoperative hypothyroidism   . Peripheral edema 06/07/2015  . Hypothyroidism, postop 03/08/2015  . Secondary malignant neoplasm of vertebral column ([place]) 03/08/2015  . Spasticity 09/19/2014  . Dysuria 09/04/2014  . Hypertension 05/01/2014  . Ingrown right big toenail 05/01/2014  . Hypokalemia 05/01/2014  . GERD (gastroesophageal reflux disease) 02/14/2014  . Dysphagia, pharyngoesophageal phase 02/06/2014  . Type 2 diabetes mellitus without complication ([place]) 59/12/2888  . Constipation   . Neurogenic bowel   . 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 Metastatic cancer to spine ([place]) 01/19/2014  .  Rotator cuff tendonitis   . Acute postoperative respiratory failure ([place]) 01/09/2014  . 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 Hurthle cell carcinoma of thyroid ([place]) 12/29/2013  . Leg weakness, bilateral 12/13/2013    [place]ness [place], PT 08/27/2016, 3:05 PM  [facility] [address] [place], [place], [address] Phone: [phone]   Fax:  [phone]  Name: TAMELA ELSAYED MRN: [id number] Date of Birth: 1965-09-13

## 2016-08-28 ENCOUNTER — Ambulatory Visit: Payer: Medicare Other | Admitting: Physical Therapy

## 2016-08-28 ENCOUNTER — Other Ambulatory Visit: Payer: Self-pay | Admitting: *Deleted

## 2016-08-28 DIAGNOSIS — I10 Essential (primary) hypertension: Secondary | ICD-10-CM

## 2016-08-28 NOTE — Telephone Encounter (Signed)
Patient requesting 90 day supply. [name], RN, BSN

## 2016-08-29 MED ORDER — LISINOPRIL 10 MG PO TABS: 10.0000 mg | ORAL_TABLET | Freq: Every day | ORAL | 3 refills | Status: DC

## 2016-08-29 NOTE — Telephone Encounter (Signed)
Attempted to reach pt multiple times. No answer. Sarah [name], CMA

## 2016-09-01 ENCOUNTER — Ambulatory Visit (HOSPITAL_BASED_OUTPATIENT_CLINIC_OR_DEPARTMENT_OTHER): Payer: Medicare Other | Admitting: Hematology

## 2016-09-01 ENCOUNTER — Encounter: Payer: Self-pay | Admitting: Hematology

## 2016-09-01 ENCOUNTER — Other Ambulatory Visit (HOSPITAL_BASED_OUTPATIENT_CLINIC_OR_DEPARTMENT_OTHER): Payer: Medicare Other

## 2016-09-01 ENCOUNTER — Telehealth: Payer: Self-pay | Admitting: Hematology

## 2016-09-01 VITALS — BP 123/75 | HR 85 | Temp 98.3°F | Resp 18 | Ht 71.0 in

## 2016-09-01 DIAGNOSIS — C73 Malignant neoplasm of thyroid gland: Secondary | ICD-10-CM | POA: Diagnosis not present

## 2016-09-01 DIAGNOSIS — K1230 Oral mucositis (ulcerative), unspecified: Secondary | ICD-10-CM

## 2016-09-01 DIAGNOSIS — Z86718 Personal history of other venous thrombosis and embolism: Secondary | ICD-10-CM

## 2016-09-01 DIAGNOSIS — C7800 Secondary malignant neoplasm of unspecified lung: Secondary | ICD-10-CM | POA: Diagnosis not present

## 2016-09-01 DIAGNOSIS — C779 Secondary and unspecified malignant neoplasm of lymph node, unspecified: Secondary | ICD-10-CM | POA: Diagnosis not present

## 2016-09-01 DIAGNOSIS — Z7901 Long term (current) use of anticoagulants: Secondary | ICD-10-CM | POA: Diagnosis not present

## 2016-09-01 DIAGNOSIS — M7989 Other specified soft tissue disorders: Secondary | ICD-10-CM

## 2016-09-01 DIAGNOSIS — C7951 Secondary malignant neoplasm of bone: Secondary | ICD-10-CM

## 2016-09-01 LAB — CBC & DIFF AND RETIC
BASO%: 0 % (ref 0.0–2.0)
BASOS ABS: 0 10*3/uL (ref 0.0–0.1)
EOS ABS: 0.1 10*3/uL (ref 0.0–0.5)
EOS%: 2.8 % (ref 0.0–7.0)
HEMATOCRIT: 33.5 % — AB (ref 34.8–46.6)
HEMOGLOBIN: 10.8 g/dL — AB (ref 11.6–15.9)
IMMATURE RETIC FRACT: 9.3 % (ref 1.60–10.00)
LYMPH%: 30 % (ref 14.0–49.7)
MCH: 28.8 pg (ref 25.1–34.0)
MCHC: 32.2 g/dL (ref 31.5–36.0)
MCV: 89.3 fL (ref 79.5–101.0)
MONO#: 0.4 10*3/uL (ref 0.1–0.9)
MONO%: 10.8 % (ref 0.0–14.0)
NEUT#: 2 10*3/uL (ref 1.5–6.5)
NEUT%: 56.4 % (ref 38.4–76.8)
Platelets: 150 10*3/uL (ref 145–400)
RBC: 3.75 10*6/uL (ref 3.70–5.45)
RDW: 16.6 % — ABNORMAL HIGH (ref 11.2–14.5)
Retic %: 1.17 % (ref 0.70–2.10)
Retic Ct Abs: 43.88 10*3/uL (ref 33.70–90.70)
WBC: 3.5 10*3/uL — ABNORMAL LOW (ref 3.9–10.3)
lymph#: 1.1 10*3/uL (ref 0.9–3.3)

## 2016-09-01 LAB — COMPREHENSIVE METABOLIC PANEL
ALBUMIN: 3 g/dL — AB (ref 3.5–5.0)
ALK PHOS: 127 U/L (ref 40–150)
ALT: 17 U/L (ref 0–55)
AST: 21 U/L (ref 5–34)
Anion Gap: 7 mEq/L (ref 3–11)
BILIRUBIN TOTAL: 0.92 mg/dL (ref 0.20–1.20)
BUN: 13.3 mg/dL (ref 7.0–26.0)
CALCIUM: 9 mg/dL (ref 8.4–10.4)
CO2: 30 mEq/L — ABNORMAL HIGH (ref 22–29)
Chloride: 106 mEq/L (ref 98–109)
Creatinine: 1.1 mg/dL (ref 0.6–1.1)
EGFR: 61 mL/min/{1.73_m2} — AB (ref >90)
Glucose: 109 mg/dl (ref 70–140)
POTASSIUM: 4.1 meq/L (ref 3.5–5.1)
Sodium: 143 mEq/L (ref 136–145)
TOTAL PROTEIN: 5.8 g/dL — AB (ref 6.4–8.3)

## 2016-09-01 NOTE — Progress Notes (Signed)
[name]    HEMATOLOGY/ONCOLOGY CLINIC NOTE  Date of Service: .09/01/2016  Patient Care Team: Elberta Leatherwood, MD as PCP - General (Family Medicine) [name], MD as Consulting Physician (Physical Medicine and Rehabilitation) Ashok Pall, MD as Consulting Physician (Neurosurgery) Brunetta Genera, MD as Consulting Physician (Hematology) Jacelyn Pi, MD as Consulting Physician (Endocrinology)   Dayton Bailiff MD ([place] [place]) [phone]. 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  01/09/2014- patient underwent total thyroidectomy by Dr. Elie Goody. Pathology showed Hurthle cell carcinoma with no perineural invasion, presence of lymphovascular invasion and extra thyroidal extension. p T3, p N0, Mx  Received 30 grays in 10 fractions to her C7-T6 lesion finished in November 2015  Received 135 mCi of iodine 131 on 07/21/2014. Whole-body scan was apparently negative.  07/05/2015-07/10/2015 admitted to [facility] for leg weakness. MRI of the T-spine showed recurrent tumor causing spinal cord compression. Patient was emergently taken to the or and underwent a redo thoracic laminectomy of T3 and partial tumor resection.   08/07/2015 PET/CT scan of the chest abdomen pelvis showed slight enlargement of FDG avid left level III and level IV lymph nodes concerning for worsening nodal metastases. Slight enlargement of the hypermetabolic right superior mediastinal mass and multiple metabolically active pulmonary nodules representing worsening metastases. T3 vertebral body and bilateral pedicle metastases status  post posture metallic fusion. No residual uptake in the thyroidectomy bed  Thyroglobulin level was 11.7 (thyroglobulin antibody present 9.8)   HISTORY OF PRESENTING ILLNESS:   Sarah [name] is a wonderful 51 y.o. female who has been referred to [place] by Dr Leamon Arnt (medical oncology at [facility]) for help with local comanagement of the patient's metastatic Hurthle cell thyroid carcinoma with details as noted above. Patient had recent hospitalization from 4/13/7 to 07/10/2015 at [facility] for lower extremity weakness and was noted to have cord compression from recurrent tumor at T3. She is status post redo thoracic laminectomy of T3 and partial tumor resection to try to decompress the core. Pathology was consistent with metastatic Hurthle cell carcinoma.  She has been on dexamethasone 2 mg by mouth twice a day. Continue to have lower extremity weakness and is currently in a skilled nursing facility getting rehabilitation. She feels her lower extremity weakness is possibly getting somewhat better.  Patient had a repeat PET/CT scan at [facility] on 08/07/2015 as noted above which shows progression of her metastatic disease including nodal metastases , pulmonary nodules and right superior mediastinal mass.  MRI of the spine on 08/14/2015 showed improved but persistent cord compression at T3 level.  She was started by Dr. Leamon Arnt on Lenvatinib and 14 mg by mouth daily for progressive metastatic disease after ruling out the option of repeat surgery with Dr. Cyndy Freeze and reradiation with Dr. Ledon Snare.  After a week or so the patient was increased to a dose of 18mg  po daily. She notes no diarrhea or other gastrointestinal symptoms. No skin rashes.  Is noted to have oral thrush and was started on nystatin swish and swallow.   Patient has a history of pulmonary embolism 2015 and has  continued to be on Xarelto for anticoagulation.   INTERVAL HISTORY  [name] here for her scheduled  followup for her metastatic Hurthle cell carcinoma. She notes that she is feeling well and has no new acute symptoms. No mucositis. No significant GI symptoms. No rash. No other overt toxicities from the Lenvatinib. Labs show some mild anemia at 10.8 and some mild leukopenia at 3.5k with an [place] of 2k. Following with Dr. Chalmers Cater for optimization of TSH suppression with levothyroxine. Patient notes some leg swelling since she has been taken of her hydrochlorothiazide and requests some Lasix.  MEDICAL HISTORY:  Past Medical History:  Diagnosis Date  . Cancer [facility])    FNA of thyroid positive for onconytic/hurthle cell carcinoma  . Chronic back pain   . DDD (degenerative disc disease), cervical   . DJD (degenerative joint disease)   . History of rectal fissure   . HIT (heparin-induced thrombocytopenia) ([place])   . Hypertension   . Obesity   . 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 Pulmonary embolus, right ([place]) 2015  . Rotator cuff tendonitis right    SURGICAL HISTORY: Past Surgical History:  Procedure Laterality Date  . LAMINECTOMY N/A 12/14/2013   Procedure: THORACIC LAMINECTOMY FOR TUMOR THORACIC THREE;  Surgeon: Ashok Pall, MD;  Location: [place] NEURO ORS;  Service: Neurosurgery;  Laterality: N/A;  THORACIC LAMINECTOMY FOR TUMOR THORACIC THREE  . LAMINECTOMY N/A 07/05/2015   Procedure: THORACIC LAMINECTOMY FOR TUMOR;  Surgeon: Ashok Pall, MD;  Location: [place] NEURO ORS;  Service: Neurosurgery;  Laterality: N/A;  . POSTERIOR LUMBAR FUSION 4 LEVEL N/A 12/30/2013   Procedure: Thoracic one-Thoracic five posterior thoracic fusion with pedicle screws;  Surgeon: Ashok Pall, MD;  Location: [place] NEURO ORS;  Service: Neurosurgery;  Laterality: N/A;  Thoracic one-Thoracic five posterior thoracic fusion with pedicle screws  . THYROIDECTOMY N/A 01/09/2014   Procedure: TOTAL THYROIDECTOMY;  Surgeon: Izora Gala, MD;  Location: [place];  Service: ENT;  Laterality: N/A;  . TONSILLECTOMY      SOCIAL  HISTORY: Social History   Social History  . Marital status: Single    Spouse name: N/A  . Number of children: N/A  . Years of education: N/A   Occupational History  . Not on file.   Social History Main Topics  . Smoking status: Never Smoker  . Smokeless tobacco: Never Used  . Alcohol use No  . Drug use: No  . Sexual activity: Not Currently   Other Topics Concern  . Not on file   Social History Narrative  . No narrative on file    FAMILY HISTORY: Family History  Problem Relation Age of Onset  . Hypertension Mother   . Diabetes Mother   . Hypertension Father   . Stroke Father   . Diabetes Father   . Diabetes Sister   . Diabetes Sister     ALLERGIES:  is allergic to bee venom; keflex [cephalexin]; tramadol; gabapentin; heparin; hydrochlorothiazide; hydrocodone; and oxycodone-acetaminophen.  MEDICATIONS:  Current Outpatient Prescriptions  Medication Sig Dispense Refill  . baclofen (LIORESAL) 20 MG tablet TAKE (1) TABLET THREE TIMES DAILY. 90 tablet 0  . colchicine 0.6 MG tablet Take 1 tablet (0.6 mg total) by mouth 2 (two) times daily as needed. 30 tablet 3  . furosemide (LASIX) 20 MG tablet Take 1 tablet (20 mg total) by mouth daily as needed. 30 tablet 1  . lenvatinib 18 mg daily dose (LENVIMA) 10 & 4 (2) MG capsule Take 18 mg by mouth daily.  90 capsule 3  . levothyroxine (SYNTHROID, LEVOTHROID) 200 MCG tablet Take 1 tablet (200 mcg total) by mouth daily before breakfast. 90 tablet 3  . lisinopril (PRINIVIL,ZESTRIL) 10 MG tablet Take 1 tablet (10 mg total) by mouth daily. 90 tablet 3  . mupirocin ointment (BACTROBAN) 2 % Place 1 application into the nose 2 (two) times daily. And on sores on extremities and face 22 g 0  . ondansetron (ZOFRAN) 4 MG tablet Take 1 tablet (4 mg total) by mouth every 6 (six) hours. 30 tablet 4  . pantoprazole (PROTONIX) 40 MG tablet Take 1 tablet (40 mg total) by mouth daily. 30 tablet 4  . senna (SENOKOT) 8.6 MG TABS tablet Take 2 tablets  (17.2 mg total) by mouth daily. 120 each 0  . sertraline (ZOLOFT) 25 MG tablet TAKE 1 TABLET ONCE A DAY 30 tablet 5  . XARELTO 20 MG TABS tablet Take 1 tablet (20 mg total) by mouth daily. 30 tablet 0   No current facility-administered medications for this visit.     REVIEW OF SYSTEMS:    10 Point review of Systems was done is negative except as noted above.  PHYSICAL EXAMINATION: ECOG PERFORMANCE STATUS: 3 - Symptomatic, >50% confined to bed  . Vitals:   09/01/16 1049  BP: 123/75  Pulse: 85  Resp: 18  Temp: 98.3 F (36.8 C)   Filed Weights   .There is no height or weight on file to calculate BMI.  GENERAL:alert, in no acute distress and comfortable, sitting in the wheelchair  SKIN: skin color, texture, turgor are normal, no rashes or significant lesions EYES: normal, conjunctiva are pink and non-injected, sclera clear OROPHARYNX: grade 1-2 oral mucositis no overt thrush noted NECK: supple, no JVD, thyroid normal size, non-tender, without nodularity LYMPH:  no palpable lymphadenopathy in the cervical, axillary or inguinal LUNGS: clear to auscultation with normal respiratory effort HEART: regular rate & rhythm,  no murmurs and no lower extremity edema ABDOMEN: abdomen soft, non-tender, normoactive bowel sounds  Musculoskeletal: no cyanosis of digits and no clubbing , bilateral chronic 1-2+ pitting pedal edema PSYCH: alert & oriented x 3 with fluent speech NEURO: Decreased bilateral lower extremity strength    LABORATORY DATA:  I have reviewed the data as listed  . CBC Latest Ref Rng & Units 09/01/2016 07/26/2016 06/17/2016  WBC 3.9 - 10.3 10e3/uL 3.5(L) 3.8(L) 2.8(L)  Hemoglobin 11.6 - 15.9 g/dL 10.8(L) 11.9(L) 10.7(L)  Hematocrit 34.8 - 46.6 % 33.5(L) 36.3 33.9(L)  Platelets 145 - 400 10e3/uL 150 174 146    . CMP Latest Ref Rng & Units 09/01/2016 07/26/2016 06/17/2016  Glucose 70 - 140 mg/dl 109 127(H) 99  BUN 7.0 - 26.0 mg/dL 13.3 8 14.0  Creatinine 0.6 - 1.1 mg/dL  1.1 1.30(H) 0.9  Sodium 136 - 145 mEq/L 143 139 142  Potassium 3.5 - 5.1 mEq/L 4.1 3.5 4.2  Chloride 101 - 111 mmol/L - 103 -  CO2 22 - 29 mEq/L 30(H) 29 29  Calcium 8.4 - 10.4 mg/dL 9.0 8.3(L) 9.0  Total Protein 6.4 - 8.3 g/dL 5.8(L) 5.7(L) 6.1(L)  Total Bilirubin 0.20 - 1.20 mg/dL 0.92 0.9 0.65  Alkaline Phos 40 - 150 U/L 127 181(H) 170(H)  AST 5 - 34 U/L 21 42(H) 39(H)  ALT 0 - 55 U/L 17 35 39   TSH <0.08  Component     Latest Ref Rng & Units 01/31/2016 06/17/2016  THYROGLOBULIN (TG-RIA)     ng/mL 3.1 33  Thyroglobulin Antibody  0.0 - 0.9 IU/mL 3.1 (H) 2.0 (H)    RADIOGRAPHIC STUDIES: I have personally reviewed the radiological images as listed and agreed with the findings in the report.  .No results found.   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 Improved based on last PET/CT  Patient has had treatment as noted above. Recently had cord compression at T3 level due to recurrent tumor status post partial resection and debulking. Recent MRI on 08/14/2015 showed improved but persistent cord compression at the T3 level. She has been noted not to be a candidate for further repeat surgery at this time or repeat radiation. Recent PET/CT scan is noted of bowel done at [facility] on 08/07/2015 showed evidence of tumor progression and patient was started on lenvatinib as per Dr. Leamon Arnt. Her thyroglobulin level was 11.7 with anti-body of 9.8 on 08/07/2015 at [place]  PET/CT 11/01/2015: shows relatively stable though high burden of disease. Some concern of local recurrence in the thyroidectomy bed which is asymptomatic at this time. MRI thoracic spine 12/25/2015: No new abnormality since the prior examination. Status post T1-5 fusion for T3 decompression. Mass effect on the cord at the T3 level appears improved compared to the prior exam. Postoperative  fluid collection likely representing a seroma seen on the prior study has slightly decreased in size.  PET/CT 06/03/2016 with Generally reduced hypermetabolic activity in the previously identified lesions, including the pulmonary nodules, dominant upper thoracic metastatic focus, and left lower cervical adenopathy. Mildly reduced size of previous lesions as well. No new lesions.  2) grade 1 oral mucositis related to Lenvatinib -resolved  3) FDG avidity in the rectum - much improved. No significant concerns on last PET  4) abnormal LFTs with borderline elevation of transaminases and increase in alkaline phosphatase. Mild transaminitis could be from her TKI. PET/ CT did not show any liver involvement or biliary obstruction. Elevation in alkaline phosphatase could be from additional bone involvement. Resolved on labs today Repeat labs today shows stable LFTs  5) Mild Anemia and minimal Neutropenia -- likely from TKI -- monitor Plan - patient has no clinical evidence of Hurthle cell carcinoma progression at this time  -Continue physical therapy. -we'll continue Lenvatinib at the current dose of 18mg  po daily. -pending thyroglobulin level and thyroglobulin antibody levels from today --Her TSH appears well suppressed- she will be following with Dr. Chalmers Cater from endocrinology for this. -RTC with Dr Irene Limbo in 2 months with labs and PET/CT PET/CT in 2 months (prior to clinic visit). -given prescription for lasix 20mg  po daily for worsening leg swelling.  5) history of right-sided pulmonary embolism on chronic anticoagulation with Rivaroxaban. Monitor for bleeding. No bleeding reported  6)  HTN. -continue f/u with PCP  RTC with Dr Irene Limbo in 2 months with labs and PET/CT PET/CT in 2 months (prior to clinic visit).  All of the patients questions were answered with apparent satisfaction. The patient knows to call the clinic with any problems, questions or concerns.  I spent 20 minutes counseling the  patient face to face. The total time spent in the appointment was 25 minutes and more than 50% was on counseling and direct patient cares.    Sullivan Lone MD [place] AAHIVMS [facility] Hematology/Oncology Physician [facility]  (Office):       [phone] (Work cell):  [phone] (Fax):  [phone]  

## 2016-09-01 NOTE — Telephone Encounter (Signed)
Scheduled appt per 6/11 los - Gave patient AVS and calender per 6/11 - [facility] to contact patient with PET schedule.

## 2016-09-01 NOTE — Patient Instructions (Signed)
Thank you for choosing [facility] to provide your oncology and hematology care.  To afford each patient quality time with our providers, please arrive 30 minutes before your scheduled appointment time.  If you arrive late for your appointment, you may be asked to reschedule.  We strive to give you quality time with our providers, and arriving late affects you and other patients whose appointments are after yours.  If you are a no show for multiple scheduled visits, you may be dismissed from the clinic at the providers discretion.   Again, thank you for choosing [facility], our hope is that these requests will decrease the amount of time that you wait before being seen by our physicians.  ______________________________________________________________________ Should you have questions after your visit to the [facility], please contact our office at [phone] between the hours of 8:30 and 4:30 p.m.    Voicemails left after 4:30p.m will not be returned until the following business day.   For prescription refill requests, please have your pharmacy contact us directly.  Please also try to allow 48 hours for prescription requests.   Please contact the scheduling department for questions regarding scheduling.  For scheduling of procedures such as PET scans, CT scans, MRI, Ultrasound, etc please contact central scheduling at [phone].   Resources For Cancer Patients and Caregivers:  American Cancer Society:  800-227-2345  Can help patients locate various types of support and financial assistance Cancer Care: 1-800-813-HOPE (4673) Provides financial assistance, online support groups, medication/co-pay assistance.   [place] DSS:  336-641-3447 Where to apply for food stamps, Medicaid, and utility assistance Medicare Rights Center: 800-333-4114 Helps people with Medicare understand their rights and benefits, navigate the Medicare system, and secure the  quality healthcare they deserve SCAT: 336-333-6589 [employer]'s shared-ride transportation service for eligible riders who have a disability that prevents them from riding the fixed route bus.   For additional information on assistance programs please contact our social worker:   Grier Hock/Abigail Elmore:  [phone] 

## 2016-09-02 ENCOUNTER — Encounter: Payer: Self-pay | Admitting: Physical Therapy

## 2016-09-02 ENCOUNTER — Ambulatory Visit: Payer: Medicare Other | Admitting: Physical Therapy

## 2016-09-02 DIAGNOSIS — M6281 Muscle weakness (generalized): Secondary | ICD-10-CM | POA: Diagnosis not present

## 2016-09-02 DIAGNOSIS — R2689 Other abnormalities of gait and mobility: Secondary | ICD-10-CM

## 2016-09-02 DIAGNOSIS — G8222 Paraplegia, incomplete: Secondary | ICD-10-CM

## 2016-09-03 ENCOUNTER — Ambulatory Visit: Payer: Medicare Other | Admitting: Physical Therapy

## 2016-09-03 ENCOUNTER — Telehealth: Payer: Self-pay

## 2016-09-03 MED ORDER — FUROSEMIDE 20 MG PO TABS: 20.0000 mg | ORAL_TABLET | Freq: Every day | ORAL | 1 refills | Status: DC | PRN

## 2016-09-03 MED FILL — LENVIMA 18 MG DAILY DOSE: 10 MG & | 30 days supply | Qty: 90 | Fill #1

## 2016-09-03 NOTE — Therapy (Signed)
[facility] [address] [place], [place], [address] Phone: [phone]   Fax:  [phone]  Physical Therapy Treatment  Patient Details  Name: Sarah [name] MRN: [id number] Date of Birth: April 06, 1965 Referring Provider: Danella Sensing, NP  Encounter Date: 09/02/2016      PT End of Session - 09/02/16 1450    Visit Number 10   Date for PT Re-Evaluation 09/01/16   Authorization Type UHC Medicare   Authorization Time Period 07-03-16 - 09-01-16   PT Start Time 1450   PT Stop Time 1530   PT Time Calculation (min) 40 min   Equipment Utilized During Treatment Gait belt   Activity Tolerance Patient tolerated treatment well   Behavior During Therapy [facility] for tasks assessed/performed      Past Medical History:  Diagnosis Date  . 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 Chronic back pain   . DDD (degenerative disc disease), cervical   . DJD (degenerative joint disease)   . History of rectal fissure   . HIT (heparin-induced thrombocytopenia) ([place])   . Hypertension   . Obesity   . 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 Pulmonary embolus, right ([place]) 2015  . Rotator cuff tendonitis right    Past Surgical History:  Procedure Laterality Date  . LAMINECTOMY N/A 12/14/2013   Procedure: THORACIC LAMINECTOMY FOR TUMOR THORACIC THREE;  Surgeon: Ashok Pall, MD;  Location: [place] NEURO ORS;  Service: Neurosurgery;  Laterality: N/A;  THORACIC LAMINECTOMY FOR TUMOR THORACIC THREE  . LAMINECTOMY N/A 07/05/2015   Procedure: THORACIC LAMINECTOMY FOR TUMOR;  Surgeon: Ashok Pall, MD;  Location: [place] NEURO ORS;  Service: Neurosurgery;  Laterality: N/A;  . POSTERIOR LUMBAR FUSION 4 LEVEL N/A 12/30/2013   Procedure: Thoracic one-Thoracic five posterior thoracic fusion with pedicle screws;  Surgeon: Ashok Pall, MD;  Location: [place] NEURO ORS;  Service: Neurosurgery;  Laterality: N/A;  Thoracic one-Thoracic five  posterior thoracic fusion with pedicle screws  . THYROIDECTOMY N/A 01/09/2014   Procedure: TOTAL THYROIDECTOMY;  Surgeon: Izora Gala, MD;  Location: [place];  Service: ENT;  Laterality: N/A;  . TONSILLECTOMY      There were no vitals filed for this visit.      Subjective Assessment - 09/02/16 1449    Subjective Still walking at home with RW with family "some". Denies any pain or falls. Does report some nausea today.    [name] accompained by: Family member   Pertinent History thoracic laminectomy for spinal tumor 12-14-13; 07-05-15 for 2nd sx due to tumor regrowth:  DDD; Rt RTC tendonitis; DDD and DJD:  chronic back pain   Patient Stated Goals get legs stronger and to walk   Currently in Pain? No/denies   Pain Score 0-No pain           [facility] Adult PT Treatment/Exercise - 09/02/16 1504      Transfers   Transfers Sit to Stand;Stand to Sit;Lateral/Scoot Transfers   Sit to Stand 4: Min assist;With upper extremity assist;From chair/3-in-1;3: Mod assist  mod assist of 2 for wheelchair to RW   Sit to Stand Details Verbal cues for sequencing;Verbal cues for technique;Verbal cues for safe use of DME/AE   Sit to Stand Details (indicate cue type and reason) min assist to stand from wheelchair to parallel bars with cues on technique, sequencing and weight shifting;   Stand to Sit 3: Mod assist;With upper extremity assist;To chair/3-in-1;Uncontrolled descent   Stand to  Sit Details (indicate cue type and reason) Verbal cues for precautions/safety;Verbal cues for sequencing;Verbal cues for technique;Verbal cues for safe use of DME/AE   Lateral/Scoot Transfers 4: Min guard   Lateral/Scoot Transfer Details (indicate cue type and reason) from wheelchair<>mat table without slide board with supervision. cues for increased weight shifting to assist with lifting buttocks with transfer.      Ambulation/Gait   Ambulation/Gait Yes   Ambulation/Gait Assistance 4: Min assist  plus second person for  safety/chair follow   Ambulation/Gait Assistance Details cues for posture, step length and for walker position with gait. Pt with heavy reliance on UE's with gait.    Ambulation Distance (Feet) 20 Feet   Assistive device Rolling walker   Pre-Gait Activities pt stood at side of parallel bars with bil UE support for 3 minutes with mostly min guard assist to min assist     Knee/Hip Exercises: Aerobic   Stationary Bike SciFit level 1.5 with UE's and LE's x 10 minutes with goal rpm >/=55 for strengthening and activity tolerance.              PT Short Term Goals - 09/02/16 1452      PT SHORT TERM GOAL #1   Title Pt. will transfer without sliding board from mat to wheelchair with SBA.                08-02-16 TARGET DATE   Baseline 09/02/16: met today   Status Achieved     PT SHORT TERM GOAL #2   Title Pt will perform sit to stand from wheelchair to // bars with +1 min assist.       08-02-16   Baseline 09/02/16: met today   Status Achieved     PT SHORT TERM GOAL #3   Title Independent in HEP for LE strengthening and stretching.                        08-02-16   Baseline 09/02/16: met per pt report   Status Achieved     PT SHORT TERM GOAL #4   Title Pt will stand inside // bars for 3" with bil. UE support with +1 min assist.   08-02-16   Baseline 09/02/16: met today    Status Achieved     PT SHORT TERM GOAL #5   Title Incr. endurance/activity tolerance so pt. able to perform 10' nonstop on recumbent bike (Scifit)               08-02-16   Baseline 09/02/16: met today   Status Achieved     PT SHORT TERM GOAL #6   Title Obtain consult for bil. KAFO's for LE's.   08-02-16;   met 07-24-16   Status Achieved           PT Long Term Goals - 07/04/16 1403      PT LONG TERM GOAL #1   Title Transfer wheelchair to/from mat with SBA without sliding board modified independently    09-01-16 TARGET DATE   Time 8   Period Weeks   Status New     PT LONG TERM GOAL #2   Title Stand at countertop (or  at trampoline at home) modified independently for at least 5" per pt report to demo increased LE strength and improved standing tolerance     PT LONG TERM GOAL #4   Title Assess gait with orthoses if obtained.  09-01-16 TARGET DATE   Time 8   Period Weeks   Status New     PT LONG TERM GOAL #5   Title Pt will tolerate rolling prone and maintaining position for at least 10 mins for stretching hip flexors.   09-01-16 TARGET DATE   Time 8   Period Weeks   Status New            Plan - 2016/09/22 1451    Clinical Impression Statement Pt met all STGs to date and is progressing towards LTGs as well. Continues to fatigue with increased activity/gait needing rest breaks between. Pt should benefit from continued PT to progress toward unmet goals.    Rehab Potential Good   Clinical Impairments Affecting Rehab Potential severity of impairments and length of time since onset   PT Frequency 2x / week   PT Treatment/Interventions ADLs/Self Care Home Management;Therapeutic exercise;Balance training;Therapeutic activities;Functional mobility training;Gait training;Neuromuscular re-education;Patient/family education;DME Instruction;Wheelchair mobility training;Passive range of motion   PT Next Visit Plan continue to work on sit/stands, standing balance with decreased UE reliance, gait with RW and LE strengthening   PT Home Exercise Plan stretches and LE strengthening   Consulted and Agree with Plan of Care Patient      Patient will benefit from skilled therapeutic intervention in order to improve the following deficits and impairments:  Abnormal gait, Difficulty walking, Decreased balance, Decreased activity tolerance, Impaired tone, Decreased strength  Visit Diagnosis: Other abnormalities of gait and mobility  Muscle weakness (generalized)  Paraplegia, incomplete (HCC)       G-Codes - 09-22-16 1630    Functional Assessment Tool Used (Outpatient Only) pt is walking short distances (max  to date of 30 feet) with RW, min assist with 2cd person for safety, mod assist of 2 for standing wheelchair<>RW; can stand for up to 3 minutes with min assist and bil UE support   Functional Limitation Mobility: Walking and moving around    CURRENT LEVEL CL GOAL                     CK    Problem List Patient Active Problem List   Diagnosis Date Noted  . Bilateral rotator cuff syndrome 02/12/2016  . E. coli UTI 08/01/2015  . Constipation due to neurogenic bowel 08/01/2015  . Muscle spasticity   . 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 Spastic paraparesis   . Neurogenic bladder   . History of pulmonary embolism   . Post-operative pain   . Metastatic cancer ([place])   . Steroid-induced hyperglycemia   . Benign essential HTN   . Acute blood loss anemia   . Leukocytosis   . Depression   . Obstipation   . Thoracic spine tumor 07/06/2015  . 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 Cord compression ([place])   . Encounter for intubation   . Lower extremity weakness   . Respiratory failure ([place])   . Postoperative hypothyroidism   . Peripheral edema 06/07/2015  . Hypothyroidism, postop 03/08/2015  . 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 Spasticity 09/19/2014  . Dysuria 09/04/2014  . Hypertension 05/01/2014  . Ingrown right big toenail 05/01/2014  . Hypokalemia 05/01/2014  . GERD (gastroesophageal reflux disease) 02/14/2014  . Dysphagia, pharyngoesophageal phase 02/06/2014  . Type 2 diabetes mellitus without complication ([place]) 02/58/5277  . Constipation   . Neurogenic bowel   . 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 Metastatic cancer to spine ([place]) 01/19/2014  .  Rotator cuff tendonitis   . Acute postoperative respiratory failure ([place]) 01/09/2014  . 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 Leg weakness, bilateral 12/13/2013    Above G code completed by Guido Sander,  PT;  recertification also completed on 09-04-16 for 6-46- 10-25-19 recertification period.  Vinnie Level YYQMGN, PT 09/04/16, 10:48 AM  Willow Ora, PTA, [facility] [address], [place], [place] [address] [phone] 09/03/16, 9:47 AM   Name: Sarah [name] MRN: [id number] Date of Birth: 11-Mar-1966

## 2016-09-04 ENCOUNTER — Other Ambulatory Visit: Payer: Self-pay | Admitting: Physical Medicine & Rehabilitation

## 2016-09-04 DIAGNOSIS — G8220 Paraplegia, unspecified: Secondary | ICD-10-CM

## 2016-09-04 DIAGNOSIS — C73 Malignant neoplasm of thyroid gland: Secondary | ICD-10-CM | POA: Diagnosis not present

## 2016-09-04 DIAGNOSIS — R030 Elevated blood-pressure reading, without diagnosis of hypertension: Secondary | ICD-10-CM | POA: Diagnosis not present

## 2016-09-04 NOTE — Telephone Encounter (Signed)
Telephone encounter.

## 2016-09-05 ENCOUNTER — Other Ambulatory Visit: Payer: Self-pay

## 2016-09-05 ENCOUNTER — Telehealth: Payer: Self-pay

## 2016-09-05 MED ORDER — FUROSEMIDE 20 MG PO TABS: 20.0000 mg | ORAL_TABLET | Freq: Every day | ORAL | 1 refills | Status: DC | PRN

## 2016-09-05 NOTE — Telephone Encounter (Signed)
Telephone encounter.

## 2016-09-08 DIAGNOSIS — C73 Malignant neoplasm of thyroid gland: Secondary | ICD-10-CM | POA: Diagnosis not present

## 2016-09-08 DIAGNOSIS — G8220 Paraplegia, unspecified: Secondary | ICD-10-CM | POA: Diagnosis not present

## 2016-09-08 DIAGNOSIS — G839 Paralytic syndrome, unspecified: Secondary | ICD-10-CM | POA: Diagnosis not present

## 2016-09-10 ENCOUNTER — Encounter: Payer: Self-pay | Admitting: Physical Therapy

## 2016-09-10 ENCOUNTER — Ambulatory Visit: Payer: Medicare Other | Admitting: Physical Therapy

## 2016-09-10 DIAGNOSIS — R2689 Other abnormalities of gait and mobility: Secondary | ICD-10-CM

## 2016-09-10 DIAGNOSIS — M6281 Muscle weakness (generalized): Secondary | ICD-10-CM

## 2016-09-10 DIAGNOSIS — G8222 Paraplegia, incomplete: Secondary | ICD-10-CM

## 2016-09-11 NOTE — Therapy (Signed)
[facility] [address] [place], [place], [address] Phone: [phone]   Fax:  [phone]  Physical Therapy Treatment  Patient Details  Name: Sarah [name] MRN: [id number] Date of Birth: 1965-09-12 Referring Provider: Danella Sensing, NP  Encounter Date: 09/10/2016      PT End of Session - 09/10/16 1033    Visit Number 11   Date for PT Re-Evaluation 09/01/16   Authorization Type UHC Medicare   Authorization Time Period 09-02-16 - 11-01-16   PT Start Time 1030  pt late due to traffic   PT Stop Time 1055   PT Time Calculation (min) 25 min   Equipment Utilized During Treatment Gait belt   Activity Tolerance Patient tolerated treatment well   Behavior During Therapy [facility] for tasks assessed/performed      Past Medical History:  Diagnosis Date  . 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 Chronic back pain   . DDD (degenerative disc disease), cervical   . DJD (degenerative joint disease)   . History of rectal fissure   . HIT (heparin-induced thrombocytopenia) ([place])   . Hypertension   . Obesity   . 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 Pulmonary embolus, right ([place]) 2015  . Rotator cuff tendonitis right    Past Surgical History:  Procedure Laterality Date  . LAMINECTOMY N/A 12/14/2013   Procedure: THORACIC LAMINECTOMY FOR TUMOR THORACIC THREE;  Surgeon: Ashok Pall, MD;  Location: [place] NEURO ORS;  Service: Neurosurgery;  Laterality: N/A;  THORACIC LAMINECTOMY FOR TUMOR THORACIC THREE  . LAMINECTOMY N/A 07/05/2015   Procedure: THORACIC LAMINECTOMY FOR TUMOR;  Surgeon: Ashok Pall, MD;  Location: [place] NEURO ORS;  Service: Neurosurgery;  Laterality: N/A;  . POSTERIOR LUMBAR FUSION 4 LEVEL N/A 12/30/2013   Procedure: Thoracic one-Thoracic five posterior thoracic fusion with pedicle screws;  Surgeon: Ashok Pall, MD;  Location: [place] NEURO ORS;  Service: Neurosurgery;  Laterality: N/A;   Thoracic one-Thoracic five posterior thoracic fusion with pedicle screws  . THYROIDECTOMY N/A 01/09/2014   Procedure: TOTAL THYROIDECTOMY;  Surgeon: Izora Gala, MD;  Location: [place];  Service: ENT;  Laterality: N/A;  . TONSILLECTOMY      There were no vitals filed for this visit.      Subjective Assessment - 09/10/16 1032    Subjective No new compliants for today. Did have a migraine this past weekend that "was the worst I have had" from Saturday until Monday. Took everything she could and nothing worked. Has had them before, however they usually only last for one day.    [name] accompained by: Family member   Pertinent History thoracic laminectomy for spinal tumor 12-14-13; 07-05-15 for 2nd sx due to tumor regrowth:  DDD; Rt RTC tendonitis; DDD and DJD:  chronic back pain   Patient Stated Goals get legs stronger and to walk   Currently in Pain? No/denies   Pain Score 0-No pain             [facility] Adult PT Treatment/Exercise - 09/10/16 1034      Transfers   Transfers Sit to Stand;Stand to Sit;Lateral/Scoot Transfers   Sit to Stand 3: Mod assist;With armrests;From chair/3-in-1  mod assist of 2 to stand chair to RW   Sit to Stand Details Verbal cues for sequencing;Verbal cues for technique;Verbal cues for safe use of DME/AE   Stand to Sit 3: Mod assist;With upper extremity assist;To chair/3-in-1;Uncontrolled descent   Stand to  Sit Details (indicate cue type and reason) Verbal cues for precautions/safety;Verbal cues for sequencing;Verbal cues for technique;Verbal cues for safe use of DME/AE     Ambulation/Gait   Ambulation/Gait Yes   Ambulation/Gait Assistance 4: Min assist   Ambulation/Gait Assistance Details 1st rep: included a 90* pivot turn with RW with cues on technique/sequencing   Ambulation Distance (Feet) 15 Feet  x1, 25 x1   Assistive device Rolling walker   Gait Pattern Step-through pattern;Decreased stride length;Ataxic;Trunk flexed;Narrow base of support    Ambulation Surface Level;Indoor             PT Short Term Goals - 09/04/16 1053      PT SHORT TERM GOAL #2   Title Pt will perform sit to stand from wheelchair to // bars with +1 SBA.      08-02-16  (Updated LTG til 10-02-16)     PT SHORT TERM GOAL #6   Title Amb. 20' at home with RW with +1 mod assist.  10-02-16     PT SHORT TERM GOAL #7   Title Perform sit to stand from wheelchair to RW with +1 min assist.  10-02-16   Time 4   Period Weeks   Status New           PT Long Term Goals - 09/04/16 1101      PT LONG TERM GOAL #6   Title Pt will amb. 30' at home with RW with min assist. 11-01-16   Baseline min assist needed            Plan - 09/10/16 1034    Clinical Impression Statement Today's skilled session continued to focus on gait with RW and transfers without any issues reported. Pt is progressing towards goals and should benefit from continued PT to progress toward unmet goals.    Rehab Potential Good   Clinical Impairments Affecting Rehab Potential severity of impairments and length of time since onset   PT Frequency 2x / week   PT Duration 8 weeks   PT Treatment/Interventions ADLs/Self Care Home Management;Therapeutic exercise;Balance training;Therapeutic activities;Functional mobility training;Gait training;Neuromuscular re-education;Patient/family education;DME Instruction;Wheelchair mobility training;Passive range of motion   PT Next Visit Plan continue to work on sit/stands, standing balance with decreased UE reliance, gait with RW and LE strengthening   PT Home Exercise Plan stretches and LE strengthening, continue to work on sit/stand transfers, gait with RW. standing balance activities   Consulted and Agree with Plan of Care Patient      Patient will benefit from skilled therapeutic intervention in order to improve the following deficits and impairments:  Abnormal gait, Difficulty walking, Decreased balance, Decreased activity tolerance, Impaired tone,  Decreased strength  Visit Diagnosis: Other abnormalities of gait and mobility  Muscle weakness (generalized)  Paraplegia, incomplete ([place])     Problem List Patient Active Problem List   Diagnosis Date Noted  . Bilateral rotator cuff syndrome 02/12/2016  . E. coli UTI 08/01/2015  . Constipation due to neurogenic bowel 08/01/2015  . Muscle spasticity   . 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 Spastic paraparesis   . Neurogenic bladder   . History of pulmonary embolism   . Post-operative pain   . Metastatic cancer ([place])   . Steroid-induced hyperglycemia   . Benign essential HTN   . Acute blood loss anemia   . Leukocytosis   . Depression   . Obstipation   . Thoracic spine tumor 07/06/2015  . 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 Cord  compression ([place])   . Encounter for intubation   . Lower extremity weakness   . Respiratory failure ([place])   . Postoperative hypothyroidism   . Peripheral edema 06/07/2015  . Hypothyroidism, postop 03/08/2015  . 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 Spasticity 09/19/2014  . Dysuria 09/04/2014  . Hypertension 05/01/2014  . Ingrown right big toenail 05/01/2014  . Hypokalemia 05/01/2014  . GERD (gastroesophageal reflux disease) 02/14/2014  . Dysphagia, pharyngoesophageal phase 02/06/2014  . Type 2 diabetes mellitus without complication ([place]) 33/43/5686  . Constipation   . Neurogenic bowel   . 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 Metastatic cancer to spine ([place]) 01/19/2014  . Rotator cuff tendonitis   . Acute postoperative respiratory failure ([place]) 01/09/2014  . 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 Leg weakness, bilateral 12/13/2013    Willow Ora, PTA, [facility] Outpatient Neuro [facility] [address], [place], [place] [address] [phone] 09/11/16, 12:54 PM    Name: Sarah [name] MRN: [id number] Date  of Birth: [birth year]

## 2016-09-12 ENCOUNTER — Encounter: Payer: Self-pay | Admitting: Physical Therapy

## 2016-09-12 ENCOUNTER — Ambulatory Visit: Payer: Medicare Other | Admitting: Physical Therapy

## 2016-09-12 DIAGNOSIS — M6281 Muscle weakness (generalized): Secondary | ICD-10-CM

## 2016-09-12 DIAGNOSIS — R2689 Other abnormalities of gait and mobility: Secondary | ICD-10-CM

## 2016-09-12 DIAGNOSIS — G8222 Paraplegia, incomplete: Secondary | ICD-10-CM

## 2016-09-12 NOTE — Therapy (Signed)
[facility] [address] [place], [place], [address] Phone: [phone]   Fax:  [phone]  Physical Therapy Treatment  Patient Details  Name: SEJLA MARZANO MRN: [id number] Date of Birth: 1965-06-21 Referring Provider: Danella Sensing, NP  Encounter Date: 09/12/2016      PT End of Session - 09/12/16 1023    Visit Number 12   Date for PT Re-Evaluation 09/01/16   Authorization Type UHC Medicare   Authorization Time Period 09-02-16 - 11-01-16   PT Start Time 1018   PT Stop Time 1100   PT Time Calculation (min) 42 min   Equipment Utilized During Treatment Gait belt   Activity Tolerance Patient tolerated treatment well   Behavior During Therapy [facility] for tasks assessed/performed      Past Medical History:  Diagnosis Date  . 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 Chronic back pain   . DDD (degenerative disc disease), cervical   . DJD (degenerative joint disease)   . History of rectal fissure   . HIT (heparin-induced thrombocytopenia) ([place])   . Hypertension   . Obesity   . 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 Pulmonary embolus, right ([place]) 2015  . Rotator cuff tendonitis right    Past Surgical History:  Procedure Laterality Date  . LAMINECTOMY N/A 12/14/2013   Procedure: THORACIC LAMINECTOMY FOR TUMOR THORACIC THREE;  Surgeon: Ashok Pall, MD;  Location: [place] [facility];  Service: Neurosurgery;  Laterality: N/A;  THORACIC LAMINECTOMY FOR TUMOR THORACIC THREE  . LAMINECTOMY N/A 07/05/2015   Procedure: THORACIC LAMINECTOMY FOR TUMOR;  Surgeon: Ashok Pall, MD;  Location: [place] NEURO ORS;  Service: Neurosurgery;  Laterality: N/A;  . POSTERIOR LUMBAR FUSION 4 LEVEL N/A 12/30/2013   Procedure: Thoracic one-Thoracic five posterior thoracic fusion with pedicle screws;  Surgeon: Ashok Pall, MD;  Location: [place] NEURO ORS;  Service: Neurosurgery;  Laterality: N/A;  Thoracic one-Thoracic five  posterior thoracic fusion with pedicle screws  . THYROIDECTOMY N/A 01/09/2014   Procedure: TOTAL THYROIDECTOMY;  Surgeon: Izora Gala, MD;  Location: [facility];  Service: ENT;  Laterality: N/A;  . TONSILLECTOMY      There were no vitals filed for this visit.      Subjective Assessment - 09/12/16 1022    Subjective No new complaints. Having some back pain today. Did walk yesterday at home some.    [name] accompained by: Family member   Pertinent History thoracic laminectomy for spinal tumor 12-14-13; 07-05-15 for 2nd sx due to tumor regrowth:  DDD; Rt RTC tendonitis; DDD and DJD:  chronic back pain   Patient Stated Goals get legs stronger and to walk   Currently in Pain? Yes   Pain Score 3    Pain Location Back   Pain Orientation Lower   Pain Descriptors / Indicators Aching   Pain Type Chronic pain   Pain Onset More than a month ago   Pain Frequency Intermittent   Aggravating Factors  unsure   Pain Relieving Factors tylenol            [facility] Adult PT Treatment/Exercise - 09/12/16 1024      Transfers   Transfers Sit to Stand;Stand to Sit;Lateral/Scoot Transfers   Sit to Stand 3: Mod assist;With upper extremity assist;From chair/3-in-1  mod assist of 2    Sit to Stand Details Verbal cues for sequencing;Verbal cues for technique;Verbal cues for safe use of DME/AE   Sit to Stand  Details (indicate cue type and reason) increased assistance to stand from wheelchair to RW than in parallel bars.    Stand to Sit 3: Mod assist;With upper extremity assist;To chair/3-in-1;Uncontrolled descent   Stand to Sit Details (indicate cue type and reason) Verbal cues for precautions/safety;Verbal cues for sequencing;Verbal cues for technique;Verbal cues for safe use of DME/AE   Stand to Sit Details cues each time to bend hips to assist with sitting down. pt had posterior lean without hip/knee flexion with sitting bringing RW with her.      Ambulation/Gait   Ambulation/Gait Yes   Ambulation/Gait  Assistance 4: Min assist   Ambulation/Gait Assistance Details pt with right knee buckling toward end of both gait trials needing to sit down in wheelchair. cues on posture and step placement with advancement for increased base of support with gait. cues to keep hips forward for trunk extension.                              Ambulation Distance (Feet) 30 Feet  x2,    Assistive device Rolling walker   Gait Pattern Step-through pattern;Decreased stride length;Ataxic;Trunk flexed;Narrow base of support   Ambulation Surface Level;Indoor     Neuro Re-ed    Neuro Re-ed Details  standing in parallel bars with right knee blocked for safety, min assist for balance with the following activities: alternating UE raises, hand clapps in front of pt and then bil UE raises x 5 reps each. cues on form and weight shifting for balance assistance. right knee buckling 1x wirh last rep of last exercise.             PT Short Term Goals - 09/04/16 1053      PT SHORT TERM GOAL #2   Title Pt will perform sit to stand from wheelchair to // bars with +1 SBA.      08-02-16  (Updated LTG til 10-02-16)     PT SHORT TERM GOAL #6   Title Amb. 20' at home with RW with +1 mod assist.  10-02-16     PT SHORT TERM GOAL #7   Title Perform sit to stand from wheelchair to RW with +1 min assist.  10-02-16   Time 4   Period Weeks   Status New           PT Long Term Goals - 09/04/16 1101      PT LONG TERM GOAL #6   Title Pt will amb. 30' at home with RW with min assist. 11-01-16   Baseline min assist needed            Plan - 09/12/16 1024    Clinical Impression Statement Today's skilled session continued to focus on gait and standing balance with decreasing UE support. Pt with several episodes of right knee buckling today needing increased assistance for balance recovery. Pt reports she's been having increased right thigh pain/tightness/cramping at night for a few days now. Advised pt if it continues to call her MD  about it. Will continue to monitor in sessions as well. Pt is progressing toward goals and should benefit from continued PT to progress toward unmet goals.                              Rehab Potential Good   Clinical Impairments Affecting Rehab Potential severity of impairments and length of time since onset  PT Frequency 2x / week   PT Duration 8 weeks   PT Treatment/Interventions ADLs/Self Care Home Management;Therapeutic exercise;Balance training;Therapeutic activities;Functional mobility training;Gait training;Neuromuscular re-education;Patient/family education;DME Instruction;Wheelchair mobility training;Passive range of motion   PT Next Visit Plan continue to work on sit/stands, standing balance with decreased UE reliance, gait with RW and LE strengthening   PT Home Exercise Plan stretches and LE strengthening, continue to work on sit/stand transfers, gait with RW. standing balance activities   Consulted and Agree with Plan of Care Patient      Patient will benefit from skilled therapeutic intervention in order to improve the following deficits and impairments:  Abnormal gait, Difficulty walking, Decreased balance, Decreased activity tolerance, Impaired tone, Decreased strength  Visit Diagnosis: Other abnormalities of gait and mobility  Muscle weakness (generalized)  Paraplegia, incomplete ([place])     Problem List Patient Active Problem List   Diagnosis Date Noted  . Bilateral rotator cuff syndrome 02/12/2016  . E. coli UTI 08/01/2015  . Constipation due to neurogenic bowel 08/01/2015  . Muscle spasticity   . 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 Spastic paraparesis   . Neurogenic bladder   . History of pulmonary embolism   . Post-operative pain   . Metastatic cancer ([place])   . Steroid-induced hyperglycemia   . Benign essential HTN   . Acute blood loss anemia   . Leukocytosis   . Depression   . Obstipation   . Thoracic spine tumor 07/06/2015  . 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 Cord compression ([place])   . Encounter for intubation   . Lower extremity weakness   . Respiratory failure ([place])   . Postoperative hypothyroidism   . Peripheral edema 06/07/2015  . Hypothyroidism, postop 03/08/2015  . Secondary malignant neoplasm of vertebral column ([place]) 03/08/2015  . Spasticity 09/19/2014  . Dysuria 09/04/2014  . Hypertension 05/01/2014  . Ingrown right big toenail 05/01/2014  . Hypokalemia 05/01/2014  . GERD (gastroesophageal reflux disease) 02/14/2014  . Dysphagia, pharyngoesophageal phase 02/06/2014  . Type 2 diabetes mellitus without complication ([place]) 13/24/4010  . Constipation   . Neurogenic bowel   . 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 ([place]) 01/20/2014  . Metastatic cancer to spine ([place]) 01/19/2014  . Rotator cuff tendonitis   . Acute postoperative respiratory failure ([place]) 01/09/2014  . 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 Leg weakness, bilateral 12/13/2013   Willow Ora, PTA, [facility] Neuro [facility] [address], [place], [place] [address] [phone] 09/12/16, 12:01 PM   Name: JAMIELYNN WIGLEY MRN: [id number] Date of Birth: Dec 16, 1965

## 2016-09-16 ENCOUNTER — Ambulatory Visit: Payer: Medicare Other | Admitting: Physical Therapy

## 2016-09-16 DIAGNOSIS — M25512 Pain in left shoulder: Secondary | ICD-10-CM | POA: Diagnosis not present

## 2016-09-16 DIAGNOSIS — M7541 Impingement syndrome of right shoulder: Secondary | ICD-10-CM | POA: Diagnosis not present

## 2016-09-16 DIAGNOSIS — M25511 Pain in right shoulder: Secondary | ICD-10-CM | POA: Diagnosis not present

## 2016-09-16 DIAGNOSIS — G8929 Other chronic pain: Secondary | ICD-10-CM | POA: Diagnosis not present

## 2016-09-17 ENCOUNTER — Ambulatory Visit: Payer: Medicare Other | Admitting: Physical Therapy

## 2016-09-17 DIAGNOSIS — M6281 Muscle weakness (generalized): Secondary | ICD-10-CM | POA: Diagnosis not present

## 2016-09-17 DIAGNOSIS — R2689 Other abnormalities of gait and mobility: Secondary | ICD-10-CM

## 2016-09-17 DIAGNOSIS — G8222 Paraplegia, incomplete: Secondary | ICD-10-CM | POA: Diagnosis not present

## 2016-09-17 NOTE — Therapy (Signed)
[facility] [address] [place], [place], [address] Phone: [phone]   Fax:  [phone]  Physical Therapy Treatment  Patient Details  Name: Sarah [name] MRN: [id number] Date of Birth: 1965/07/29 Referring Provider: Danella Sensing, NP  Encounter Date: 09/17/2016      PT End of Session - 09/17/16 2147    Visit Number 13  G3   Date for PT Re-Evaluation 11/01/16   Authorization Type UHC Medicare   Authorization Time Period 09-02-16 - 11-01-16   PT Start Time 1232   PT Stop Time 1316   PT Time Calculation (min) 44 min   Equipment Utilized During Treatment Gait belt      Past Medical History:  Diagnosis Date  . 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 Chronic back pain   . DDD (degenerative disc disease), cervical   . DJD (degenerative joint disease)   . History of rectal fissure   . HIT (heparin-induced thrombocytopenia) ([place])   . Hypertension   . Obesity   . 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 Pulmonary embolus, right ([place]) 2015  . Rotator cuff tendonitis right    Past Surgical History:  Procedure Laterality Date  . LAMINECTOMY N/A 12/14/2013   Procedure: THORACIC LAMINECTOMY FOR TUMOR THORACIC THREE;  Surgeon: Ashok Pall, MD;  Location: [place] [facility] NEUR[facility]S;  Service: Neurosurgery;  Laterality: N/A;  THORACIC LAMINECTOMY FOR TUMOR THORACIC THREE  . LAMINECTOMY N/A 07/05/2015   Procedure: THORACIC LAMINECTOMY FOR TUMOR;  Surgeon: Ashok Pall, MD;  Location: [facility] NEURO ORS;  Service: Neurosurgery;  Laterality: N/A;  . POSTERIOR LUMBAR FUSION 4 LEVEL N/A 12/30/2013   Procedure: Thoracic one-Thoracic five posterior thoracic fusion with pedicle screws;  Surgeon: Ashok Pall, MD;  Location: [place] NEURO ORS;  Service: Neurosurgery;  Laterality: N/A;  Thoracic one-Thoracic five posterior thoracic fusion with pedicle screws  . THYROIDECTOMY N/A 01/09/2014   Procedure: TOTAL THYROIDECTOMY;   Surgeon: Izora Gala, MD;  Location: [facility];  Service: ENT;  Laterality: N/A;  . TONSILLECTOMY      There were no vitals filed for this visit.      Subjective Assessment - 09/17/16 2138    Subjective Pt states she has been standing some at home but has been afraid to walk since her knee buckled in PT last week   [name] accompained by: Family member   Pertinent History thoracic laminectomy for spinal tumor 12-14-13; 07-05-15 for 2nd sx due to tumor regrowth:  DDD; Rt RTC tendonitis; DDD and DJD:  chronic back pain   Patient Stated Goals get legs stronger and to walk   Currently in Pain? No/denies                         [facility] Adult PT Treatment/Exercise - 09/17/16 1256      Transfers   Transfers Sit to Stand;Stand to Sit;Lateral/Scoot Transfers   Sit to Stand 3: Mod assist  mod assist of 1   Sit to Stand Details Verbal cues for sequencing;Verbal cues for technique;Verbal cues for safe use of DME/AE   Stand to Sit 3: Mod assist;With upper extremity assist;To chair/3-in-1;Uncontrolled descent   Stand to Sit Details cues to flex hips for controlled descent     Ambulation/Gait   Ambulation/Gait Yes   Ambulation/Gait Assistance 4: Min assist   Ambulation/Gait Assistance Details no occurrenc of Rt knee buckling today; cues to extend knee and  hips prior to stepping with LLE    Ambulation Distance (Feet) 25 Feet  28' 2nd rep   Assistive device Rolling walker   Gait Pattern Step-through pattern;Decreased stride length;Ataxic;Trunk flexed;Narrow base of support   Ambulation Surface Level;Indoor      SciFit level 2.0 x 5" with UE's and LE's   Pt worked on sit to stand transfers from mat to [employer] x 3 reps with +1 mod assist (PTA student giving SBA prn for safety)  Pt stood for approx. 30 secs with bil. UE support on RW with min assist x 3 reps       PT Short Term Goals - 09/04/16 1053      PT SHORT TERM GOAL #2   Title Pt will perform sit to stand from wheelchair  to // bars with +1 SBA.      08-02-16  (Updated LTG til 10-02-16)     PT SHORT TERM GOAL #6   Title Amb. 20' at home with RW with +1 mod assist.  10-02-16     PT SHORT TERM GOAL #7   Title Perform sit to stand from wheelchair to RW with +1 min assist.  10-02-16   Time 4   Period Weeks   Status New           PT Long Term Goals - 09/04/16 1101      PT LONG TERM GOAL #6   Title Pt will amb. 30' at home with RW with min assist. 11-01-16   Baseline min assist needed               Plan - 09/17/16 2147    Clinical Impression Statement Pt did well with ambulation today with no occurrence of Rt knee buckling during gait training.  Pt needed mod assist for sit to stand to RW from mat and needs cues to flex hips for stand to sitting transfer,   Rehab Potential Good   Clinical Impairments Affecting Rehab Potential severity of impairments and length of time since onset   PT Frequency 2x / week   PT Duration 8 weeks   PT Treatment/Interventions ADLs/Self Care Home Management;Therapeutic exercise;Balance training;Therapeutic activities;Functional mobility training;Gait training;Neuromuscular re-education;Patient/family education;DME Instruction;Wheelchair mobility training;Passive range of motion   PT Next Visit Plan continue to work on sit/stands, standing balance with decreased UE reliance, gait with RW and LE strengthening   PT Home Exercise Plan stretches and LE strengthening, continue to work on sit/stand transfers, gait with RW. standing balance activities   Consulted and Agree with Plan of Care Patient      Patient will benefit from skilled therapeutic intervention in order to improve the following deficits and impairments:  Abnormal gait, Difficulty walking, Decreased balance, Decreased activity tolerance, Impaired tone, Decreased strength  Visit Diagnosis: Other abnormalities of gait and mobility  Muscle weakness (generalized)     Problem List Patient Active Problem List    Diagnosis Date Noted  . Bilateral rotator cuff syndrome 02/12/2016  . E. coli UTI 08/01/2015  . Constipation due to neurogenic bowel 08/01/2015  . Muscle spasticity   . 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 Spastic paraparesis   . Neurogenic bladder   . History of pulmonary embolism   . Post-operative pain   . Metastatic cancer ([place])   . Steroid-induced hyperglycemia   . Benign essential HTN   . Acute blood loss anemia   . Leukocytosis   . Depression   . Obstipation   . Thoracic spine tumor 07/06/2015  . Metastasis  from malignant tumor of thyroid ([place]) 07/06/2015  . Cord compression ([place])   . Encounter for intubation   . Lower extremity weakness   . Respiratory failure ([place])   . Postoperative hypothyroidism   . Peripheral edema 06/07/2015  . Hypothyroidism, postop 03/08/2015  . Secondary malignant neoplasm of vertebral column ([place]) 03/08/2015  . Spasticity 09/19/2014  . Dysuria 09/04/2014  . Hypertension 05/01/2014  . Ingrown right big toenail 05/01/2014  . Hypokalemia 05/01/2014  . GERD (gastroesophageal reflux disease) 02/14/2014  . Dysphagia, pharyngoesophageal phase 02/06/2014  . Type 2 diabetes mellitus without complication ([place]) 09/73/5329  . Constipation   . Neurogenic bowel   . 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) 01/20/2014  . Metastatic cancer to spine ([place]) 01/19/2014  . Rotator cuff tendonitis   . Acute postoperative respiratory failure ([place]) 01/09/2014  . 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 Leg weakness, bilateral 12/13/2013    Alda Lea, PT 09/17/2016, 9:54 PM  [facility] [address] [place], [place], [address] Phone: [phone]   Fax:  [phone]  Name: NINI CAVAN MRN: [id number] Date of Birth: 1965/04/27

## 2016-09-30 ENCOUNTER — Ambulatory Visit: Payer: Medicare Other | Admitting: Physical Therapy

## 2016-10-02 ENCOUNTER — Ambulatory Visit: Payer: Medicare Other | Attending: Registered Nurse | Admitting: Physical Therapy

## 2016-10-02 DIAGNOSIS — M6281 Muscle weakness (generalized): Secondary | ICD-10-CM | POA: Diagnosis not present

## 2016-10-02 DIAGNOSIS — G8222 Paraplegia, incomplete: Secondary | ICD-10-CM | POA: Diagnosis not present

## 2016-10-02 DIAGNOSIS — R2689 Other abnormalities of gait and mobility: Secondary | ICD-10-CM | POA: Diagnosis not present

## 2016-10-03 NOTE — Therapy (Signed)
[facility] [address] [place], [place], [address] Phone: [phone]   Fax:  [phone]  Physical Therapy Treatment  Patient Details  Name: Sarah [name] MRN: [id number] Date of Birth: April 27, 1965 Referring Provider: Danella Sensing, NP  Encounter Date: 10/02/2016      PT End of Session - 10/03/16 2226    Visit Number 14  G4   Date for PT Re-Evaluation 11/01/16   Authorization Type UHC Medicare   Authorization Time Period 09-02-16 - 11-01-16   PT Start Time 1103   PT Stop Time 1147   PT Time Calculation (min) 44 min   Equipment Utilized During Treatment Gait belt      Past Medical History:  Diagnosis Date  . 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 Chronic back pain   . DDD (degenerative disc disease), cervical   . DJD (degenerative joint disease)   . History of rectal fissure   . HIT (heparin-induced thrombocytopenia) ([place])   . Hypertension   . Obesity   . 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)   . Pulmonary embolus, right ([place]) 2015  . Rotator cuff tendonitis right    Past Surgical History:  Procedure Laterality Date  . LAMINECTOMY N/A 12/14/2013   Procedure: THORACIC LAMINECTOMY FOR TUMOR THORACIC THREE;  Surgeon: Ashok Pall, MD;  Location: [facility];  Service: Neurosurgery;  Laterality: N/A;  THORACIC LAMINECTOMY FOR TUMOR THORACIC THREE  . LAMINECTOMY N/A 07/05/2015   Procedure: THORACIC LAMINECTOMY FOR TUMOR;  Surgeon: Ashok Pall, MD;  Location: [facility];  Service: Neurosurgery;  Laterality: N/A;  . POSTERIOR LUMBAR FUSION 4 LEVEL N/A 12/30/2013   Procedure: Thoracic one-Thoracic five posterior thoracic fusion with pedicle screws;  Surgeon: Ashok Pall, MD;  Location: [facility];  Service: Neurosurgery;  Laterality: N/A;  Thoracic one-Thoracic five posterior thoracic fusion with pedicle screws  . THYROIDECTOMY N/A 01/09/2014   Procedure: TOTAL THYROIDECTOMY;   Surgeon: Izora Gala, MD;  Location: [facility];  Service: ENT;  Laterality: N/A;  . TONSILLECTOMY      There were no vitals filed for this visit.      Subjective Assessment - 10/03/16 2215    Subjective Pt states her legs and back are stiff - she went to the beach last week and the house they rented was not handicapped accessible, despite what they were told; pt states she was unable to stand or move around very much   Pertinent History thoracic laminectomy for spinal tumor 12-14-13; 07-05-15 for 2nd sx due to tumor regrowth:  DDD; Rt RTC tendonitis; DDD and DJD:  chronic back pain   Patient Stated Goals get legs stronger and to walk   Currently in Pain? No/denies                         [facility] Adult PT Treatment/Exercise - 10/03/16 0001      Transfers   Transfers Sit to Stand;Stand to Sit;Lateral/Scoot Transfers   Sit to Stand 4: Min assist   Sit to Stand Details (indicate cue type and reason) from wheelchair to // bars   Stand to Sit 3: Mod assist;With upper extremity assist;To chair/3-in-1;Uncontrolled descent   Stand to Sit Details cues to flex hips for controlled descent     Ambulation/Gait   Ambulation/Gait Yes   Ambulation/Gait Assistance 4: Min assist   Ambulation/Gait Assistance Details no occurrences of Rt knee buckling   Ambulation  Distance (Feet) 10 Feet  4 reps inside // bars   Assistive device Parallel bars   Gait Pattern Step-through pattern;Decreased stride length;Ataxic;Trunk flexed;Narrow base of support   Ambulation Surface Level;Indoor      Pt declined attempting to stand with RW due to incr. C/o back discomfort/spasms with standing and with ambulation today  (pt also declined riding [employer] due to mother informing her that their car battery was dead)          PT Short Term Goals - 10/03/16 2220      PT SHORT TERM GOAL #6   Title Amb. 20' at home with RW with +1 mod assist.  10-02-16   Time 4   Period Weeks   Status New     PT SHORT  TERM GOAL #7   Title Perform sit to stand from wheelchair to RW with +1 min assist.  10-02-16   Time 4   Period Weeks   Status New           PT Long Term Goals - 10/03/16 2221      PT LONG TERM GOAL #5   Title Pt will tolerate rolling prone and maintaining position for at least 10 mins for stretching hip flexors.   09-01-16 TARGET DATE   Status New     PT LONG TERM GOAL #6   Title Pt will amb. 30' at home with RW with min assist. 11-01-16   Time 4   Period Weeks   Status New               Plan - 10/03/16 2228    Clinical Impression Statement Pt c/o increased back spasms during gait today - possibly due to inactivity and lack of stretching within past 2 weeks with pt being on vacation and reporting inability to stand/attempt amb.    Rehab Potential Good   Clinical Impairments Affecting Rehab Potential severity of impairments and length of time since onset   PT Frequency 2x / week   PT Duration 8 weeks   PT Treatment/Interventions ADLs/Self Care Home Management;Therapeutic exercise;Balance training;Therapeutic activities;Functional mobility training;Gait training;Neuromuscular re-education;Patient/family education;DME Instruction;Wheelchair mobility training;Passive range of motion   PT Next Visit Plan check STG's next week as pt missed week of 09-22-16:  cont gait training, sit to stand transfers   PT Home Exercise Plan stretches and LE strengthening, continue to work on sit/stand transfers, gait with RW. standing balance activities   Consulted and Agree with Plan of Care Patient      Patient will benefit from skilled therapeutic intervention in order to improve the following deficits and impairments:  Abnormal gait, Difficulty walking, Decreased balance, Decreased activity tolerance, Impaired tone, Decreased strength  Visit Diagnosis: Other abnormalities of gait and mobility  Muscle weakness (generalized)     Problem List Patient Active Problem List   Diagnosis  Date Noted  . Bilateral rotator cuff syndrome 02/12/2016  . E. coli UTI 08/01/2015  . Constipation due to neurogenic bowel 08/01/2015  . Muscle spasticity   . 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 Spastic paraparesis   . Neurogenic bladder   . History of pulmonary embolism   . Post-operative pain   . Metastatic cancer ([place])   . Steroid-induced hyperglycemia   . Benign essential HTN   . Acute blood loss anemia   . Leukocytosis   . Depression   . Obstipation   . Thoracic spine tumor 07/06/2015  . 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 Cord compression (  [place])   . Encounter for intubation   . Lower extremity weakness   . Respiratory failure ([place])   . Postoperative hypothyroidism   . Peripheral edema 06/07/2015  . Hypothyroidism, postop 03/08/2015  . 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 Spasticity 09/19/2014  . Dysuria 09/04/2014  . Hypertension 05/01/2014  . Ingrown right big toenail 05/01/2014  . Hypokalemia 05/01/2014  . GERD (gastroesophageal reflux disease) 02/14/2014  . Dysphagia, pharyngoesophageal phase 02/06/2014  . Type 2 diabetes mellitus without complication ([place]) 88/01/314  . Constipation   . Neurogenic bowel   . 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 Metastatic cancer to spine ([place]) 01/19/2014  . Rotator cuff tendonitis   . Acute postoperative respiratory failure ([place]) 01/09/2014  . 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 Leg weakness, bilateral 12/13/2013    Alda Lea, PT 10/03/2016, 10:31 PM  [facility] [address] [place], [place], [address] Phone: [phone]   Fax:  [phone]  Name: Sarah [name] MRN: [id number] Date of Birth: 1965/11/08

## 2016-10-06 ENCOUNTER — Other Ambulatory Visit: Payer: Self-pay | Admitting: Physical Medicine & Rehabilitation

## 2016-10-06 DIAGNOSIS — G8220 Paraplegia, unspecified: Secondary | ICD-10-CM

## 2016-10-08 ENCOUNTER — Ambulatory Visit: Payer: Medicare Other | Admitting: Physical Therapy

## 2016-10-08 ENCOUNTER — Encounter: Payer: Self-pay | Admitting: Physical Therapy

## 2016-10-08 DIAGNOSIS — G8220 Paraplegia, unspecified: Secondary | ICD-10-CM | POA: Diagnosis not present

## 2016-10-08 DIAGNOSIS — G8222 Paraplegia, incomplete: Secondary | ICD-10-CM

## 2016-10-08 DIAGNOSIS — R2689 Other abnormalities of gait and mobility: Secondary | ICD-10-CM

## 2016-10-08 DIAGNOSIS — M6281 Muscle weakness (generalized): Secondary | ICD-10-CM

## 2016-10-08 DIAGNOSIS — G839 Paralytic syndrome, unspecified: Secondary | ICD-10-CM | POA: Diagnosis not present

## 2016-10-08 DIAGNOSIS — C73 Malignant neoplasm of thyroid gland: Secondary | ICD-10-CM | POA: Diagnosis not present

## 2016-10-09 NOTE — Therapy (Signed)
[facility] [address] [place], [place], [address] Phone: [phone]   Fax:  [phone]  Physical Therapy Treatment  Patient Details  Name: Sarah [name] MRN: [id number] Date of Birth: Mar 17, 1966 Referring Provider: Danella Sensing, NP  Encounter Date: 10/08/2016      PT End of Session - 10/08/16 1452    Visit Number 15  G-code due visit #20   Date for PT Re-Evaluation 11/01/16   Authorization Type UHC Medicare   Authorization Time Period 09-02-16 - 11-01-16   PT Start Time 1448   PT Stop Time 1526   PT Time Calculation (min) 38 min   Equipment Utilized During Treatment Gait belt      Past Medical History:  Diagnosis Date  . 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 Chronic back pain   . DDD (degenerative disc disease), cervical   . DJD (degenerative joint disease)   . History of rectal fissure   . HIT (heparin-induced thrombocytopenia) ([place])   . Hypertension   . Obesity   . 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 ([place])   . Pulmonary embolus, right ([place]) 2015  . Rotator cuff tendonitis right    Past Surgical History:  Procedure Laterality Date  . LAMINECTOMY N/A 12/14/2013   Procedure: THORACIC LAMINECTOMY FOR TUMOR THORACIC THREE;  Surgeon: Ashok Pall, MD;  Location: [facility];  Service: Neurosurgery;  Laterality: N/A;  THORACIC LAMINECTOMY FOR TUMOR THORACIC THREE  . LAMINECTOMY N/A 07/05/2015   Procedure: THORACIC LAMINECTOMY FOR TUMOR;  Surgeon: Ashok Pall, MD;  Location: [facility];  Service: Neurosurgery;  Laterality: N/A;  . POSTERIOR LUMBAR FUSION 4 LEVEL N/A 12/30/2013   Procedure: Thoracic one-Thoracic five posterior thoracic fusion with pedicle screws;  Surgeon: Ashok Pall, MD;  Location: [facility];  Service: Neurosurgery;  Laterality: N/A;  Thoracic one-Thoracic five posterior thoracic fusion with pedicle screws  . THYROIDECTOMY N/A 01/09/2014   Procedure:  TOTAL THYROIDECTOMY;  Surgeon: Izora Gala, MD;  Location: [facility];  Service: ENT;  Laterality: N/A;  . TONSILLECTOMY      There were no vitals filed for this visit.      Subjective Assessment - 10/08/16 1451    Subjective No new complaints. No falls to report. States her back is better today than it was last time.    [name] accompained by: Family member   Pertinent History thoracic laminectomy for spinal tumor 12-14-13; 07-05-15 for 2nd sx due to tumor regrowth:  DDD; Rt RTC tendonitis; DDD and DJD:  chronic back pain   Patient Stated Goals get legs stronger and to walk   Currently in Pain? Yes   Pain Score 2    Pain Location Back   Pain Orientation Lower   Pain Descriptors / Indicators Aching   Pain Type Chronic pain   Pain Onset More than a month ago   Pain Frequency Intermittent   Aggravating Factors  immobility   Pain Relieving Factors tylenol            [facility] Adult PT Treatment/Exercise - 10/08/16 1456      Transfers   Transfers Sit to Stand;Stand to Sit   Sit to Stand 4: Min guard;3: Mod assist;With upper extremity assist;From chair/3-in-1   Sit to Stand Details Verbal cues for sequencing;Verbal cues for technique;Verbal cues for safe use of DME/AE   Sit to Stand Details (indicate cue type and reason) increased assistance needed to stand from w/c  to walker with cues to scoot to edge of chair and for hand placement.   Stand to Sit 3: Mod assist;With upper extremity assist;To chair/3-in-1;Uncontrolled descent   Stand to Sit Details (indicate cue type and reason) Verbal cues for precautions/safety;Verbal cues for sequencing;Verbal cues for technique;Verbal cues for safe use of DME/AE   Stand to Sit Details cues for increased hip/knee flexion with sitting down and for use of arms for slow, controlled descent with sitting down.      Ambulation/Gait   Ambulation/Gait Yes   Ambulation/Gait Assistance 4: Min assist   Ambulation/Gait Assistance Details min guard assist in  parallel bars with gait with mod cues on posture and weight shifting;    Ambulation Distance (Feet) 10 Feet  x2 in parallel bars; 14 x2  with RW   Assistive device Parallel bars;Rolling walker   Gait Pattern Step-through pattern;Decreased stride length;Ataxic;Trunk flexed;Narrow base of support   Ambulation Surface Level;Indoor            PT Short Term Goals - 10/08/16 1456      PT SHORT TERM GOAL #6   Title Amb. 20' at home with RW with +1 mod assist.  10-02-16   Time --   Period --   Status Partially Met     PT SHORT TERM GOAL #7   Title Perform sit to stand from wheelchair to RW with +1 min assist.  10-02-16   Baseline 10/08/16: needed varied assistance from min<>mod assist    Time --   Period --   Status Partially Met           PT Long Term Goals - 10/03/16 2221      PT LONG TERM GOAL #5   Title Pt will tolerate rolling prone and maintaining position for at least 10 mins for stretching hip flexors.   09-01-16 TARGET DATE   Status New     PT LONG TERM GOAL #6   Title Pt will amb. 30' at home with RW with min assist. 11-01-16   Time 4   Period Weeks   Status New               Plan - 10/08/16 1453    Clinical Impression Statement Today's skilled session addressed progress toward STGs with 1 goal met and other goal partially met. Pt continued to be limited by right back/hip/leg pain with standing/gait today. Pt adivsed to stretch legs every day and use cryo at home for pain as well. Pt should benefit from continued PT to progress toward unmet goals.   Rehab Potential Good   Clinical Impairments Affecting Rehab Potential severity of impairments and length of time since onset   PT Frequency 2x / week   PT Duration 8 weeks   PT Treatment/Interventions ADLs/Self Care Home Management;Therapeutic exercise;Balance training;Therapeutic activities;Functional mobility training;Gait training;Neuromuscular re-education;Patient/family education;DME Instruction;Wheelchair  mobility training;Passive range of motion   PT Next Visit Plan  cont gait training, sit to stand transfers   PT Home Exercise Plan stretches and LE strengthening, continue to work on sit/stand transfers, gait with RW. standing balance activities   Consulted and Agree with Plan of Care Patient      Patient will benefit from skilled therapeutic intervention in order to improve the following deficits and impairments:  Abnormal gait, Difficulty walking, Decreased balance, Decreased activity tolerance, Impaired tone, Decreased strength, Increased edema  Visit Diagnosis: Other abnormalities of gait and mobility  Muscle weakness (generalized)  Paraplegia, incomplete ([facility])     Problem  List Patient Active Problem List   Diagnosis Date Noted  . Bilateral rotator cuff syndrome 02/12/2016  . E. coli UTI 08/01/2015  . Constipation due to neurogenic bowel 08/01/2015  . Muscle spasticity   . 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 Spastic paraparesis   . Neurogenic bladder   . History of pulmonary embolism   . Post-operative pain   . Metastatic cancer ([place])   . Steroid-induced hyperglycemia   . Benign essential HTN   . Acute blood loss anemia   . Leukocytosis   . Depression   . Obstipation   . Thoracic spine tumor 07/06/2015  . 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 Cord compression ([place])   . Encounter for intubation   . Lower extremity weakness   . Respiratory failure ([place])   . Postoperative hypothyroidism   . Peripheral edema 06/07/2015  . Hypothyroidism, postop 03/08/2015  . 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 Spasticity 09/19/2014  . Dysuria 09/04/2014  . Hypertension 05/01/2014  . Ingrown right big toenail 05/01/2014  . Hypokalemia 05/01/2014  . GERD (gastroesophageal reflux disease) 02/14/2014  . Dysphagia, pharyngoesophageal phase 02/06/2014  . Type 2 diabetes mellitus without complication ([place]) 60/15/6153  .  Constipation   . Neurogenic bowel   . 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 ([place]) 01/20/2014  . Metastatic cancer to spine ([place]) 01/19/2014  . Rotator cuff tendonitis   . Acute postoperative respiratory failure ([place]) 01/09/2014  . 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 Leg weakness, bilateral 12/13/2013    Willow Ora, PTA, [facility] [address] [place], [place] [address] [phone] 10/09/16, 1:55 PM   Name: Sarah [name] MRN: [id number] Date of Birth: 10/30/1965

## 2016-10-13 ENCOUNTER — Other Ambulatory Visit: Payer: Self-pay | Admitting: Physical Medicine & Rehabilitation

## 2016-10-14 MED FILL — LENVIMA 18 MG DAILY DOSE: 10 MG & | 30 days supply | Qty: 90 | Fill #2

## 2016-10-16 ENCOUNTER — Ambulatory Visit: Payer: Medicare Other | Admitting: Physical Therapy

## 2016-10-17 ENCOUNTER — Encounter: Payer: Self-pay | Admitting: Physical Therapy

## 2016-10-17 ENCOUNTER — Ambulatory Visit: Payer: Medicare Other | Admitting: Physical Therapy

## 2016-10-17 DIAGNOSIS — M6281 Muscle weakness (generalized): Secondary | ICD-10-CM | POA: Diagnosis not present

## 2016-10-17 DIAGNOSIS — G8222 Paraplegia, incomplete: Secondary | ICD-10-CM | POA: Diagnosis not present

## 2016-10-17 DIAGNOSIS — R2689 Other abnormalities of gait and mobility: Secondary | ICD-10-CM | POA: Diagnosis not present

## 2016-10-20 ENCOUNTER — Ambulatory Visit: Payer: Medicare Other | Admitting: Physical Therapy

## 2016-10-20 NOTE — Therapy (Signed)
[facility] [address] [place], [place], [address] Phone: [phone]   Fax:  [phone]  Physical Therapy Treatment  Patient Details  Name: Sarah [name] MRN: [id number] Date of Birth: [birth year] Referring Provider: Danella Sensing, NP  Encounter Date: 10/17/2016   10/17/16 1237  PT Visits / Re-Eval  Visit Number 16 (G-code due visit #20)  Date for PT Re-Evaluation 11/01/16  Authorization  Authorization Type U[employer] Medicare  Authorization Time Period 09-02-16 - 11-01-16  PT Time Calculation  PT Start Time 1233  PT Stop Time 1318  PT Time Calculation (min) 45 min  PT - End of Session  Equipment Utilized During Treatment Gait belt  Behavior During Therapy [facility] for tasks assessed/performed     Past Medical History:  Diagnosis Date  . 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 Chronic back pain   . DDD (degenerative disc disease), cervical   . DJD (degenerative joint disease)   . History of rectal fissure   . HIT (heparin-induced thrombocytopenia) ([place])   . Hypertension   . Obesity   . 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 Pulmonary embolus, right ([place]) 2015  . Rotator cuff tendonitis right    Past Surgical History:  Procedure Laterality Date  . LAMINECTOMY N/A 12/14/2013   Procedure: THORACIC LAMINECTOMY FOR TUMOR THORACIC THREE;  Surgeon: Ashok Pall, MD;  Location: [place] NEURO ORS;  Service: Neurosurgery;  Laterality: N/A;  THORACIC LAMINECTOMY FOR TUMOR THORACIC THREE  . LAMINECTOMY N/A 07/05/2015   Procedure: THORACIC LAMINECTOMY FOR TUMOR;  Surgeon: Ashok Pall, MD;  Location: [place] NEURO ORS;  Service: Neurosurgery;  Laterality: N/A;  . POSTERIOR LUMBAR FUSION 4 LEVEL N/A 12/30/2013   Procedure: Thoracic one-Thoracic five posterior thoracic fusion with pedicle screws;  Surgeon: Ashok Pall, MD;  Location: [place] NEURO ORS;  Service: Neurosurgery;  Laterality: N/A;  Thoracic  one-Thoracic five posterior thoracic fusion with pedicle screws  . THYROIDECTOMY N/A 01/09/2014   Procedure: TOTAL THYROIDECTOMY;  Surgeon: Izora Gala, MD;  Location: [place];  Service: ENT;  Laterality: N/A;  . TONSILLECTOMY      There were no vitals filed for this visit.     10/17/16 1236  Symptoms/Limitations  Subjective No new complaints. No falls to report.  Back is hurting today.  [name] accompained by: Family member  Pertinent History thoracic laminectomy for spinal tumor 12-14-13; 07-05-15 for 2nd sx due to tumor regrowth:  DDD; Rt RTC tendonitis; DDD and DJD:  chronic back pain  Patient Stated Goals get legs stronger and to walk  Pain Assessment  Currently in Pain? Yes  Pain Score 2  Pain Orientation Lower  Pain Descriptors / Indicators Aching  Pain Type Chronic pain  Aggravating Factors  immobility  Pain Relieving Factors tylenol, stretching      10/17/16 1309  Bed Mobility  Bed Mobility Rolling Right;Rolling Left;Left Sidelying to Sit;Sit to Sidelying Left  Rolling Right 4: Min assist;3: Mod assist  Rolling Right Details (indicate cue type and reason) rolling right all the way into prone, mod assist needed for last portion of movement into prone  Rolling Left 4: Min assist;3: Mod assist  Rolling Left Details (indicate cue type and reason) rolling this way to come out of prone back into supine/hooklying with up to mod assist at end ranges  Left Sidelying to Sit 4: Min assist  Left Sidelying to Sit Details (indicate cue type and reason) cues on technique  adn most assitance needed with trunk transision into sitting   Sit to Sidelying Left 4: Min guard  Sit to Sidelying Left Details (indicate cue type and reason) uncontrolled descent, however pt was able to lie self down, including getting her legs up onto mat table.   Transfers  Transfers Lateral/Scoot Transfers;Sit to Stand;Stand to Sit;Stand Pivot Transfers  Sit to Stand 4: Min assist;From elevated surface;With upper  extremity assist;From bed  Sit to Stand Details Verbal cues for sequencing;Verbal cues for technique;Verbal cues for safe use of DME/AE  Stand to Sit 3: Mod assist;With upper extremity assist;To chair/3-in-1;Uncontrolled descent  Stand to Sit Details (indicate cue type and reason) Verbal cues for precautions/safety;Verbal cues for sequencing;Verbal cues for technique;Verbal cues for safe use of DME/AE  Stand Pivot Transfers 4: Min assist  Stand Pivot Transfer Details (indicate cue type and reason) with RW from mat table to wheelchair. cues on posture and sequencing needed.   Lateral/Scoot Transfers 4: Min guard;With armrests removed  Lateral/Scoot Transfer Details (indicate cue type and reason) from wheelchair to mat table with increased time and cues for increased lift for increased buttock clearance with transfer  Lumbar Exercises: Stretches  Prone on Elbows Stretch 5 reps;10 seconds;Limitations  Prone on Elbows Stretch Limitations cues on form and technique  Knee/Hip Exercises: Stretches  Hip Flexor Stretch Both;1 rep;Limitations  Hip Flexor Stretch Limitations supine with leg extended out on one side, other leg bent for hooklying position. held this stretch for 2-3 minutes each side  Knee/Hip Exercises: Seated  [place] Arc Quad AROM;Strengthening;Both;1 set;10 reps;Weights;Limitations  Hamstring Curl Strengthening;Both;1 set;10 reps;Limitations  Hamstring Limitations using red  with cues for slow, controlled motions  Long Arc Quad Weight 4 lbs.  [place] [employer] Limitations cues on form and hold times  Knee/Hip Exercises: Prone  Hamstring Curl 1 set;10 reps;Limitations  Hamstring Curl Limitations bil legs with cues on technique, min AA at times  Hip Extension AROM;AAROM;Strengthening;Both;1 set;10 reps;Limitations  Hip Extension Limitations min assist for controlled movements         PT Short Term Goals - 10/08/16 1456      PT SHORT TERM GOAL #6   Title Amb. 20' at home with RW with +1  mod assist.  10-02-16   Time --   Period --   Status Partially Met     PT SHORT TERM GOAL #7   Title Perform sit to stand from wheelchair to RW with +1 min assist.  10-02-16   Baseline 10/08/16: needed varied assistance from min<>mod assist    Time --   Period --   Status Partially Met           PT Long Term Goals - 10/03/16 2221      PT LONG TERM GOAL #5   Title Pt will tolerate rolling prone and maintaining position for at least 10 mins for stretching hip flexors.   09-01-16 TARGET DATE   Status New     PT LONG TERM GOAL #6   Title Pt will amb. 30' at home with RW with min assist. 11-01-16   Time 4   Period Weeks   Status New        10/17/16 1237  Plan  Clinical Impression Statement Today's skilled session focused on bed mobility, stand pivot transfers with RW and LE strengthening. Pt was able to maintain prone position for >10 minutes while engaged in activities. Pt is progressing toward goals and should benefit from continued PT to progress toward unmet goals.  Pt will benefit from skilled therapeutic intervention in order to improve on the following deficits Abnormal gait;Difficulty walking;Decreased balance;Decreased activity tolerance;Impaired tone;Decreased strength;Increased edema  Rehab Potential Good  Clinical Impairments Affecting Rehab Potential severity of impairments and length of time since onset  PT Frequency 2x / week  PT Duration 8 weeks  PT Treatment/Interventions ADLs/Self Care Home Management;Therapeutic exercise;Balance training;Therapeutic activities;Functional mobility training;Gait training;Neuromuscular re-education;Patient/family education;DME Instruction;Wheelchair mobility training;Passive range of motion  PT Next Visit Plan cont gait training, sit to stand transfers  PT Home Exercise Plan stretches and LE strengthening, continue to work on sit/stand transfers, gait with RW. standing balance activities  Consulted and Agree with Plan of Care Patient         Patient will benefit from skilled therapeutic intervention in order to improve the following deficits and impairments:  Abnormal gait, Difficulty walking, Decreased balance, Decreased activity tolerance, Impaired tone, Decreased strength, Increased edema  Visit Diagnosis: Muscle weakness (generalized)  Paraplegia, incomplete ([place])     Problem List Patient Active Problem List   Diagnosis Date Noted  . Bilateral rotator cuff syndrome 02/12/2016  . E. coli UTI 08/01/2015  . Constipation due to neurogenic bowel 08/01/2015  . Muscle spasticity   . 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 Spastic paraparesis   . Neurogenic bladder   . History of pulmonary embolism   . Post-operative pain   . Metastatic cancer ([place])   . Steroid-induced hyperglycemia   . Benign essential HTN   . Acute blood loss anemia   . Leukocytosis   . Depression   . Obstipation   . Thoracic spine tumor 07/06/2015  . 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 Cord compression ([place])   . Encounter for intubation   . Lower extremity weakness   . Respiratory failure ([place])   . Postoperative hypothyroidism   . Peripheral edema 06/07/2015  . Hypothyroidism, postop 03/08/2015  . Secondary malignant neoplasm of vertebral column ([place]) 03/08/2015  . Spasticity 09/19/2014  . Dysuria 09/04/2014  . Hypertension 05/01/2014  . Ingrown right big toenail 05/01/2014  . Hypokalemia 05/01/2014  . GERD (gastroesophageal reflux disease) 02/14/2014  . Dysphagia, pharyngoesophageal phase 02/06/2014  . Type 2 diabetes mellitus without complication ([place]) 42/87/6811  . Constipation   . Neurogenic bowel   . 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 ([place]) 01/20/2014  . Metastatic cancer to spine ([place]) 01/19/2014  . Rotator cuff tendonitis   . Acute postoperative respiratory failure ([place]) 01/09/2014  . 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 Leg weakness,  bilateral 12/13/2013    Willow Ora, PTA, [facility] [address] [place], [place] [address] [phone] 10/20/16, 8:30 AM   Name: KIMANH TEMPLEMAN MRN: [id number] Date of Birth: April 23, 1965

## 2016-10-22 ENCOUNTER — Ambulatory Visit: Payer: Medicare Other | Admitting: Physical Therapy

## 2016-10-23 ENCOUNTER — Ambulatory Visit: Payer: Medicare Other | Attending: Registered Nurse | Admitting: Physical Therapy

## 2016-10-23 DIAGNOSIS — G8222 Paraplegia, incomplete: Secondary | ICD-10-CM | POA: Diagnosis not present

## 2016-10-23 DIAGNOSIS — R2689 Other abnormalities of gait and mobility: Secondary | ICD-10-CM | POA: Diagnosis not present

## 2016-10-23 DIAGNOSIS — M6281 Muscle weakness (generalized): Secondary | ICD-10-CM | POA: Insufficient documentation

## 2016-10-24 ENCOUNTER — Telehealth: Payer: Self-pay | Admitting: *Deleted

## 2016-10-24 NOTE — Therapy (Signed)
[facility] [address] [place], [place], [address] Phone: [phone]   Fax:  [phone]  Physical Therapy Treatment  Patient Details  Name: Sarah [name] MRN: [id number] Date of Birth: [birth year] Referring Provider: Danella Sensing, NP  Encounter Date: 10/23/2016      PT End of Session - 10/24/16 1321    Visit Number [address]   Date for PT Re-Evaluation 11/01/16   Authorization Type UHC Medicare   Authorization Time Period 09-02-16 - 11-01-16   PT Start Time 0847   PT Stop Time 0932   PT Time Calculation (min) 45 min   Equipment Utilized During Treatment Gait belt      Past Medical History:  Diagnosis Date  . 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 Chronic back pain   . DDD (degenerative disc disease), cervical   . DJD (degenerative joint disease)   . History of rectal fissure   . HIT (heparin-induced thrombocytopenia) ([place])   . Hypertension   . Obesity   . 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 ([place])   . Pulmonary embolus, right ([place]) 2015  . Rotator cuff tendonitis right    Past Surgical History:  Procedure Laterality Date  . LAMINECTOMY N/A 12/14/2013   Procedure: THORACIC LAMINECTOMY FOR TUMOR THORACIC THREE;  Surgeon: Ashok Pall, MD;  Location: [place] NEUR[facility] ORS;  Service: Neurosurgery;  Laterality: N/A;  THORACIC LAMINECTOMY FOR TUMOR THORACIC THREE  . LAMINECTOMY N/A 07/05/2015   Procedure: THORACIC LAMINECTOMY FOR TUMOR;  Surgeon: Ashok Pall, MD;  Location: [place] NEUR[facility] ORS;  Service: Neurosurgery;  Laterality: N/A;  . POSTERIOR LUMBAR FUSION 4 LEVEL N/A 12/30/2013   Procedure: Thoracic one-Thoracic five posterior thoracic fusion with pedicle screws;  Surgeon: Ashok Pall, MD;  Location: [place] NEUR[facility]S;  Service: Neurosurgery;  Laterality: N/A;  Thoracic one-Thoracic five posterior thoracic fusion with pedicle screws  . THYROIDECTOMY N/A 01/09/2014   Procedure: TOTAL THYROIDECTOMY;   Surgeon: Izora Gala, MD;  Location: [facility];  Service: ENT;  Laterality: N/A;  . TONSILLECTOMY      There were no vitals filed for this visit.      Subjective Assessment - 10/24/16 1312    Subjective Pt states she has been standing some at home but has not walked at home since back and stomach have been hurting   Pertinent History thoracic laminectomy for spinal tumor 12-14-13; 07-05-15 for 2nd sx due to tumor regrowth:  DDD; Rt RTC tendonitis; DDD and DJD:  chronic back pain   Patient Stated Goals get legs stronger and to walk   Currently in Pain? Yes   Pain Score 2    Pain Location Back   Pain Orientation Lower   Pain Descriptors / Indicators Aching   Pain Type Acute pain   Pain Onset More than a month ago   Pain Frequency Intermittent                         [facility] Adult PT Treatment/Exercise - 10/24/16 0001      Bed Mobility   Bed Mobility Rolling Right;Rolling Left;Left Sidelying to Sit;Sit to Sidelying Left   Rolling Right 5: Supervision   Rolling Left 5: Supervision   Left Sidelying to Sit 4: Min assist     Transfers   Transfers Lateral/Scoot Transfers;Sit to Stand;Stand to Sit;Stand Pivot Transfers   Sit to Stand 5: Supervision   Stand to Sit 4: Min assist  Stand to Sit Details (indicate cue type and reason) Verbal cues for precautions/safety;Verbal cues for sequencing;Verbal cues for technique;Verbal cues for safe use of DME/AE     Ambulation/Gait   Ambulation/Gait Yes   Ambulation/Gait Assistance 4: Min assist  +2  for safety   Ambulation/Gait Assistance Details min assist with cues for upright posture   Ambulation Distance (Feet) 15 Feet   Assistive device Rolling walker   Gait Pattern Step-through pattern;Decreased hip/knee flexion - right;Decreased hip/knee flexion - left;Decreased dorsiflexion - right;Decreased dorsiflexion - left   Ambulation Surface Indoor;Level     Lumbar Exercises: Prone   Other Prone Lumbar Exercises Pt performed prone  press ups on forearms - 4 reps for approx. 10 sec hold     Knee/Hip Exercises: Supine   Heel Slides AAROM;Both;1 set;10 reps     Knee/Hip Exercises: Prone   Hamstring Curl 1 set;10 reps  bil. LE                  PT Short Term Goals - 10/08/16 1456      PT SHORT TERM GOAL #6   Title Amb. 20' at home with RW with +1 mod assist.  10-02-16   Time --   Period --   Status Partially Met     PT SHORT TERM GOAL #7   Title Perform sit to stand from wheelchair to RW with +1 min assist.  10-02-16   Baseline 10/08/16: needed varied assistance from min<>mod assist    Time --   Period --   Status Partially Met           PT Long Term Goals - 10/03/16 2221      PT LONG TERM GOAL #5   Title Pt will tolerate rolling prone and maintaining position for at least 10 mins for stretching hip flexors.   09-01-16 TARGET DATE   Status New     PT LONG TERM GOAL #6   Title Pt will amb. 30' at home with RW with min assist. 11-01-16   Time 4   Period Weeks   Status New               Plan - 10/24/16 1322    Clinical Impression Statement Pt able to roll prone with min assist for positioning of RUE; pt amb. 15' with RW and reports this was first time she has amb. in past 2 weeks; Pt's left hip flexors not as tight as Rt hip flexors as able to passively extend left hip to slightly past neutral but unable to passively extend right hip to neurtral position   Rehab Potential Good   Clinical Impairments Affecting Rehab Potential severity of impairments and length of time since onset   PT Frequency 2x / week   PT Duration 8 weeks   PT Treatment/Interventions ADLs/Self Care Home Management;Therapeutic exercise;Balance training;Therapeutic activities;Functional mobility training;Gait training;Neuromuscular re-education;Patient/family education;DME Instruction;Wheelchair mobility training;Passive range of motion   PT Next Visit Plan  cont gait training, sit to stand transfers   PT Home Exercise  Plan stretches and LE strengthening, continue to work on sit/stand transfers, gait with RW. standing balance activities   Consulted and Agree with Plan of Care Patient      Patient will benefit from skilled therapeutic intervention in order to improve the following deficits and impairments:  Abnormal gait, Difficulty walking, Decreased balance, Decreased activity tolerance, Impaired tone, Decreased strength, Increased edema  Visit Diagnosis: Other abnormalities of gait and mobility  Muscle weakness (generalized)  Problem List Patient Active Problem List   Diagnosis Date Noted  . Bilateral rotator cuff syndrome 02/12/2016  . E. coli UTI 08/01/2015  . Constipation due to neurogenic bowel 08/01/2015  . Muscle spasticity   . 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 Spastic paraparesis   . Neurogenic bladder   . History of pulmonary embolism   . Post-operative pain   . Metastatic cancer ([place])   . Steroid-induced hyperglycemia   . Benign essential HTN   . Acute blood loss anemia   . Leukocytosis   . Depression   . Obstipation   . Thoracic spine tumor 07/06/2015  . 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 Cord compression ([place])   . Encounter for intubation   . Lower extremity weakness   . Respiratory failure ([place])   . Postoperative hypothyroidism   . Peripheral edema 06/07/2015  . Hypothyroidism, postop 03/08/2015  . 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 Spasticity 09/19/2014  . Dysuria 09/04/2014  . Hypertension 05/01/2014  . Ingrown right big toenail 05/01/2014  . Hypokalemia 05/01/2014  . GERD (gastroesophageal reflux disease) 02/14/2014  . Dysphagia, pharyngoesophageal phase 02/06/2014  . Type 2 diabetes mellitus without complication ([place]) 38/88/2800  . Constipation   . Neurogenic bowel   . 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 ([place]) 01/20/2014  . Metastatic cancer to spine ([place]) 01/19/2014  . Rotator cuff tendonitis   . Acute  postoperative respiratory failure ([place]) 01/09/2014  . 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 Leg weakness, bilateral 12/13/2013    Loyce Flaming, [place], PT 10/24/2016, 1:27 PM  [facility] [address] [place], [place], [address] Phone: [phone]   Fax:  [phone]  Name: Sarah [name] MRN: [id number] Date of Birth: 1965-11-28

## 2016-10-24 NOTE — Telephone Encounter (Signed)
Sarah [name] called and she has a "tooth abcessing", and her dentist office is closed and her sister cannot take her to the ED until after 4 pm. She is asking if Dr Naaman Plummer can call her in an antibiotic.  I spoke with [name] and explained that Dr Naaman Plummer is not in the office and will not be back until the following week. She should try her dentist phone # they should have an emergency contact.  She had not considered that option. She will call them.

## 2016-10-27 ENCOUNTER — Telehealth: Payer: Self-pay | Admitting: *Deleted

## 2016-10-27 NOTE — Telephone Encounter (Signed)
On 10-27-16 fax medical records to the [employer]

## 2016-10-28 ENCOUNTER — Ambulatory Visit: Payer: Medicare Other | Admitting: Physical Therapy

## 2016-10-28 DIAGNOSIS — R2689 Other abnormalities of gait and mobility: Secondary | ICD-10-CM | POA: Diagnosis not present

## 2016-10-28 DIAGNOSIS — M6281 Muscle weakness (generalized): Secondary | ICD-10-CM | POA: Diagnosis not present

## 2016-10-28 DIAGNOSIS — G8222 Paraplegia, incomplete: Secondary | ICD-10-CM | POA: Diagnosis not present

## 2016-10-29 NOTE — Therapy (Signed)
[facility] [address] [place], [place], [address] Phone: [phone]   Fax:  [phone]  Physical Therapy Treatment  Patient Details  Name: Sarah [name] MRN: [id number] Date of Birth: 04-Nov-1965 Referring Provider: Danella Sensing, NP  Encounter Date: 10/28/2016      PT End of Session - 10/29/16 2107    Visit Number 14  G8   Date for PT Re-Evaluation 11/01/16   Authorization Type UHC Medicare   Authorization Time Period 09-02-16 - 11-01-16   PT Start Time 1147   PT Stop Time 1231   PT Time Calculation (min) 44 min      Past Medical History:  Diagnosis Date  . 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 Chronic back pain   . DDD (degenerative disc disease), cervical   . DJD (degenerative joint disease)   . History of rectal fissure   . HIT (heparin-induced thrombocytopenia) ([place])   . Hypertension   . Obesity   . 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 Pulmonary embolus, right ([place]) 2015  . Rotator cuff tendonitis right    Past Surgical History:  Procedure Laterality Date  . LAMINECTOMY N/A 12/14/2013   Procedure: THORACIC LAMINECTOMY FOR TUMOR THORACIC THREE;  Surgeon: Ashok Pall, MD;  Location: [place] NEURO ORS;  Service: Neurosurgery;  Laterality: N/A;  THORACIC LAMINECTOMY FOR TUMOR THORACIC THREE  . LAMINECTOMY N/A 07/05/2015   Procedure: THORACIC LAMINECTOMY FOR TUMOR;  Surgeon: Ashok Pall, MD;  Location: [place] [facility];  Service: Neurosurgery;  Laterality: N/A;  . POSTERIOR LUMBAR FUSION 4 LEVEL N/A 12/30/2013   Procedure: Thoracic one-Thoracic five posterior thoracic fusion with pedicle screws;  Surgeon: Ashok Pall, MD;  Location: [place] [facility]UR[facility]S;  Service: Neurosurgery;  Laterality: N/A;  Thoracic one-Thoracic five posterior thoracic fusion with pedicle screws  . THYROIDECTOMY N/A 01/09/2014   Procedure: TOTAL THYROIDECTOMY;  Surgeon: Izora Gala, MD;  Location: [facility];   Service: ENT;  Laterality: N/A;  . TONSILLECTOMY      There were no vitals filed for this visit.      Subjective Assessment - 10/29/16 2105    Subjective Pt states she has been standing but not walking at home   [name] accompained by: Family member   Pertinent History thoracic laminectomy for spinal tumor 12-14-13; 07-05-15 for 2nd sx due to tumor regrowth:  DDD; Rt RTC tendonitis; DDD and DJD:  chronic back pain   Patient Stated Goals get legs stronger and to walk   Currently in Pain? No/denies                         [facility] Adult PT Treatment/Exercise - 10/29/16 0001      Transfers   Transfers Sit to Stand   Sit to Stand 4: Min guard   Number of Reps Other reps (comment)  3 reps from high/low mat table to RW     Ambulation/Gait   Ambulation/Gait Yes   Ambulation/Gait Assistance 4: Min assist  +2  for safety   Ambulation/Gait Assistance Details cues for upright posture   Ambulation Distance (Feet) 15 Feet  15' x2 reps, 20' x 1 rep   Assistive device Rolling walker   Gait Pattern Step-through pattern;Decreased hip/knee flexion - right;Decreased hip/knee flexion - left;Decreased dorsiflexion - right;Decreased dorsiflexion - left   Ambulation Surface Level;Indoor  PT Short Term Goals - 10/08/16 1456      PT SHORT TERM GOAL #6   Title Amb. 20' at home with RW with +1 mod assist.  10-02-16   Time --   Period --   Status Partially Met     PT SHORT TERM GOAL #7   Title Perform sit to stand from wheelchair to RW with +1 min assist.  10-02-16   Baseline 10/08/16: needed varied assistance from min<>mod assist    Time --   Period --   Status Partially Met           PT Long Term Goals - 10/03/16 2221      PT LONG TERM GOAL #5   Title Pt will tolerate rolling prone and maintaining position for at least 10 mins for stretching hip flexors.   09-01-16 TARGET DATE   Status New     PT LONG TERM GOAL #6   Title Pt will amb. 30' at  home with RW with min assist. 11-01-16   Time 4   Period Weeks   Status New               Plan - 10/29/16 2108    Clinical Impression Statement Pt continues to have decreased endurance and activity tolerance with standing and with ambulation; needs cues to stand erect   Rehab Potential Good   Clinical Impairments Affecting Rehab Potential severity of impairments and length of time since onset   PT Frequency 2x / week   PT Duration 8 weeks   PT Treatment/Interventions ADLs/Self Care Home Management;Therapeutic exercise;Balance training;Therapeutic activities;Functional mobility training;Gait training;Neuromuscular re-education;Patient/family education;DME Instruction;Wheelchair mobility training;Passive range of motion   PT Next Visit Plan  cont gait training, sit to stand transfers   PT Home Exercise Plan stretches and LE strengthening, continue to work on sit/stand transfers, gait with RW. standing balance activities   Consulted and Agree with Plan of Care Patient      Patient will benefit from skilled therapeutic intervention in order to improve the following deficits and impairments:  Abnormal gait, Difficulty walking, Decreased balance, Decreased activity tolerance, Impaired tone, Decreased strength, Increased edema  Visit Diagnosis: Other abnormalities of gait and mobility  Muscle weakness (generalized)     Problem List Patient Active Problem List   Diagnosis Date Noted  . Bilateral rotator cuff syndrome 02/12/2016  . E. coli UTI 08/01/2015  . Constipation due to neurogenic bowel 08/01/2015  . Muscle spasticity   . 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 Spastic paraparesis   . Neurogenic bladder   . History of pulmonary embolism   . Post-operative pain   . Metastatic cancer ([place])   . Steroid-induced hyperglycemia   . Benign essential HTN   . Acute blood loss anemia   . Leukocytosis   . Depression   . Obstipation   . Thoracic spine tumor 07/06/2015  . Metastasis  from malignant tumor of thyroid ([place]) 07/06/2015  . Cord compression ([place])   . Encounter for intubation   . Lower extremity weakness   . Respiratory failure ([place])   . Postoperative hypothyroidism   . Peripheral edema 06/07/2015  . Hypothyroidism, postop 03/08/2015  . 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 Spasticity 09/19/2014  . Dysuria 09/04/2014  . Hypertension 05/01/2014  . Ingrown right big toenail 05/01/2014  . Hypokalemia 05/01/2014  . GERD (gastroesophageal reflux disease) 02/14/2014  . Dysphagia, pharyngoesophageal phase 02/06/2014  . Type 2 diabetes mellitus without complication ([place]) 10/93/2355  .  Constipation   . Neurogenic bowel   . 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 Metastatic cancer to spine ([place]) 01/19/2014  . Rotator cuff tendonitis   . Acute postoperative respiratory failure ([place]) 01/09/2014  . 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 Leg weakness, bilateral 12/13/2013    DildayJenness Corner, PT 10/29/2016, 9:10 PM  [facility] [address] [place], [place], [address] Phone: [phone]   Fax:  [phone]  Name: SHELBE HAGLUND MRN: [id number] Date of Birth: [birth year]

## 2016-10-30 ENCOUNTER — Ambulatory Visit: Payer: Medicare Other | Admitting: Physical Therapy

## 2016-10-30 DIAGNOSIS — R2689 Other abnormalities of gait and mobility: Secondary | ICD-10-CM | POA: Diagnosis not present

## 2016-10-30 DIAGNOSIS — M6281 Muscle weakness (generalized): Secondary | ICD-10-CM

## 2016-10-30 DIAGNOSIS — G8222 Paraplegia, incomplete: Secondary | ICD-10-CM | POA: Diagnosis not present

## 2016-10-30 NOTE — Therapy (Signed)
[facility] [address] [place], [place], [address] Phone: [phone]   Fax:  [phone]  Physical Therapy Treatment  Patient Details  Name: Sarah [name] MRN: [id number] Date of Birth: Jan 29, 1966 Referring Provider: Danella Sensing, NP  Encounter Date: 10/30/2016      PT End of Session - 10/31/16 1635    Visit Number 19  G9   Date for PT Re-Evaluation 11/01/16   Authorization Type UHC Medicare   Authorization Time Period 09-02-16 - 11-01-16   PT Start Time 1017   PT Stop Time 1101   PT Time Calculation (min) 44 min   Equipment Utilized During Treatment Gait belt      Past Medical History:  Diagnosis Date  . 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 Chronic back pain   . DDD (degenerative disc disease), cervical   . DJD (degenerative joint disease)   . History of rectal fissure   . HIT (heparin-induced thrombocytopenia) ([place])   . Hypertension   . Obesity   . 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 ([place])   . Pulmonary embolus, right ([place]) 2015  . Rotator cuff tendonitis right    Past Surgical History:  Procedure Laterality Date  . LAMINECTOMY N/A 12/14/2013   Procedure: THORACIC LAMINECTOMY FOR TUMOR THORACIC THREE;  Surgeon: Ashok Pall, MD;  Location: [facility]UR[facility]S;  Service: Neurosurgery;  Laterality: N/A;  THORACIC LAMINECTOMY FOR TUMOR THORACIC THREE  . LAMINECTOMY N/A 07/05/2015   Procedure: THORACIC LAMINECTOMY FOR TUMOR;  Surgeon: Ashok Pall, MD;  Location: [facility]S;  Service: Neurosurgery;  Laterality: N/A;  . POSTERIOR LUMBAR FUSION 4 LEVEL N/A 12/30/2013   Procedure: Thoracic one-Thoracic five posterior thoracic fusion with pedicle screws;  Surgeon: Ashok Pall, MD;  Location: [place] NEUR[facility] ORS;  Service: Neurosurgery;  Laterality: N/A;  Thoracic one-Thoracic five posterior thoracic fusion with pedicle screws  . THYROIDECTOMY N/A 01/09/2014   Procedure: TOTAL THYROIDECTOMY;   Surgeon: Izora Gala, MD;  Location: [facility];  Service: ENT;  Laterality: N/A;  . TONSILLECTOMY      There were no vitals filed for this visit.      Subjective Assessment - 10/31/16 1555    Subjective Sister accompanying pt to PT today - asks "what will it take to get her to walk at home?"; pt states she is standing but not walking at home due to lack of assistance as it takes 2 people and she doesn't have extra help there til after 5:00   [name] accompained by: Family member   Pertinent History thoracic laminectomy for spinal tumor 12-14-13; 07-05-15 for 2nd sx due to tumor regrowth:  DDD; Rt RTC tendonitis; DDD and DJD:  chronic back pain   Patient Stated Goals get legs stronger and to walk   Currently in Pain? Yes   Pain Score 3    Pain Location Back   Pain Orientation Mid   Pain Descriptors / Indicators Aching   Pain Type Chronic pain   Pain Onset More than a month ago   Pain Frequency Intermittent                         [facility] Adult PT Treatment/Exercise - 10/31/16 0001      Bed Mobility   Bed Mobility Rolling Right;Rolling Left;Left Sidelying to Sit;Sit to Sidelying Left   Rolling Right 5: Supervision   Rolling Right Details (indicate cue type and reason) Rt  UE gets caught under her chest and is difficult to remove out from under her in prone positio   Rolling Left 5: Supervision   Sit to Sidelying Left 4: Min assist     Transfers   Transfers Sit to Stand   Sit to Stand 3: Mod assist;4: Min assist  from mat   Stand to Sit 3: Mod assist   Number of Reps Other reps (comment)     Ambulation/Gait   Ambulation/Gait Yes   Ambulation/Gait Assistance 4: Min assist   Ambulation/Gait Assistance Details cues for upright posture   Ambulation Distance (Feet) 19 Feet  30' 2nd rep   Assistive device Rolling walker   Gait Pattern Step-through pattern;Decreased hip/knee flexion - right;Decreased hip/knee flexion - left;Decreased dorsiflexion - right;Decreased  dorsiflexion - left   Ambulation Surface Level;Indoor     Lumbar Exercises: Stretches   Prone on Elbows Stretch 3 reps;10 seconds                  PT Short Term Goals - 10/08/16 1456      PT SHORT TERM GOAL #6   Title Amb. 20' at home with RW with +1 mod assist.  10-02-16   Time --   Period --   Status Partially Met     PT SHORT TERM GOAL #7   Title Perform sit to stand from wheelchair to RW with +1 min assist.  10-02-16   Baseline 10/08/16: needed varied assistance from min<>mod assist    Time --   Period --   Status Partially Met           PT Long Term Goals - 10/31/16 1643      PT LONG TERM GOAL #1   Title Transfer wheelchair to/from mat with SBA without sliding board modified independently    09-01-16 TARGET DATE   Status Achieved     PT LONG TERM GOAL #2   Title Stand at [place] (or at trampoline at home) modified independently for at least 5" per pt report to demo increased LE strength and improved standing tolerance     PT LONG TERM GOAL #5   Title Pt will tolerate rolling prone and maintaining position for at least 10 mins for stretching hip flexors.   09-01-16 TARGET DATE     PT LONG TERM GOAL #6   Title Pt will amb. 30' at home with RW with min assist. 11-01-16               Plan - 10/31/16 1638    Clinical Impression Statement Pt demonstrating increased endurance with amb. distance today compared to that in previous session: 86' and 30' compared to 15' x 2 reps and 20' in previous session:  pt has difficulty standing up straight due to c/o back discomfort   Rehab Potential Good   Clinical Impairments Affecting Rehab Potential severity of impairments and length of time since onset   PT Frequency 2x / week   PT Duration 8 weeks   PT Treatment/Interventions ADLs/Self Care Home Management;Therapeutic exercise;Balance training;Therapeutic activities;Functional mobility training;Gait training;Neuromuscular re-education;Patient/family education;DME  Instruction;Wheelchair mobility training;Passive range of motion   PT Next Visit Plan  Check LTG's - do G Code:  cont gait training, sit to stand transfers   PT Home Exercise Plan stretches and LE strengthening, continue to work on sit/stand transfers, gait with RW. standing balance activities   Consulted and Agree with Plan of Care Patient      Patient will benefit from skilled  therapeutic intervention in order to improve the following deficits and impairments:  Abnormal gait, Difficulty walking, Decreased balance, Decreased activity tolerance, Impaired tone, Decreased strength, Increased edema  Visit Diagnosis: Other abnormalities of gait and mobility  Muscle weakness (generalized)     Problem List Patient Active Problem List   Diagnosis Date Noted  . Bilateral rotator cuff syndrome 02/12/2016  . E. coli UTI 08/01/2015  . Constipation due to neurogenic bowel 08/01/2015  . Muscle spasticity   . 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 Spastic paraparesis   . Neurogenic bladder   . History of pulmonary embolism   . Post-operative pain   . Metastatic cancer ([place])   . Steroid-induced hyperglycemia   . Benign essential HTN   . Acute blood loss anemia   . Leukocytosis   . Depression   . Obstipation   . Thoracic spine tumor 07/06/2015  . 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 Cord compression ([place])   . Encounter for intubation   . Lower extremity weakness   . Respiratory failure ([place])   . Postoperative hypothyroidism   . Peripheral edema 06/07/2015  . Hypothyroidism, postop 03/08/2015  . 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 Spasticity 09/19/2014  . Dysuria 09/04/2014  . Hypertension 05/01/2014  . Ingrown right big toenail 05/01/2014  . Hypokalemia 05/01/2014  . GERD (gastroesophageal reflux disease) 02/14/2014  . Dysphagia, pharyngoesophageal phase 02/06/2014  . Type 2 diabetes mellitus without complication ([place])  51/88/4166  . Constipation   . Neurogenic bowel   . 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 ([place]) 01/20/2014  . Metastatic cancer to spine ([place]) 01/19/2014  . Rotator cuff tendonitis   . Acute postoperative respiratory failure ([place]) 01/09/2014  . 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 Leg weakness, bilateral 12/13/2013    DildayJenness Corner, PT 10/31/2016, 4:45 PM  [facility] [address] [place], [place], [address] Phone: [phone]   Fax:  [phone]  Name: ROSABELLA EDGIN MRN: [id number] Date of Birth: December 12, 1965

## 2016-11-03 ENCOUNTER — Other Ambulatory Visit: Payer: Self-pay | Admitting: Physical Medicine & Rehabilitation

## 2016-11-03 DIAGNOSIS — G8220 Paraplegia, unspecified: Secondary | ICD-10-CM

## 2016-11-08 DIAGNOSIS — G8220 Paraplegia, unspecified: Secondary | ICD-10-CM | POA: Diagnosis not present

## 2016-11-08 DIAGNOSIS — C73 Malignant neoplasm of thyroid gland: Secondary | ICD-10-CM | POA: Diagnosis not present

## 2016-11-08 DIAGNOSIS — G839 Paralytic syndrome, unspecified: Secondary | ICD-10-CM | POA: Diagnosis not present

## 2016-11-10 MED FILL — LENVIMA 18 MG DAILY DOSE: 10 MG & | 30 days supply | Qty: 90 | Fill #3

## 2016-11-11 ENCOUNTER — Ambulatory Visit: Payer: Medicare Other | Admitting: Physical Therapy

## 2016-11-12 ENCOUNTER — Encounter: Payer: Medicare Other | Attending: Physical Medicine & Rehabilitation | Admitting: Physical Medicine & Rehabilitation

## 2016-11-12 ENCOUNTER — Other Ambulatory Visit: Payer: Self-pay | Admitting: Physical Medicine & Rehabilitation

## 2016-11-12 ENCOUNTER — Encounter: Payer: Self-pay | Admitting: Physical Medicine & Rehabilitation

## 2016-11-12 VITALS — BP 168/104 | HR 80

## 2016-11-12 DIAGNOSIS — Z9889 Other specified postprocedural states: Secondary | ICD-10-CM | POA: Diagnosis not present

## 2016-11-12 DIAGNOSIS — C73 Malignant neoplasm of thyroid gland: Secondary | ICD-10-CM | POA: Insufficient documentation

## 2016-11-12 DIAGNOSIS — Z86711 Personal history of pulmonary embolism: Secondary | ICD-10-CM | POA: Diagnosis not present

## 2016-11-12 DIAGNOSIS — Z5189 Encounter for other specified aftercare: Secondary | ICD-10-CM | POA: Insufficient documentation

## 2016-11-12 DIAGNOSIS — M109 Gout, unspecified: Secondary | ICD-10-CM | POA: Insufficient documentation

## 2016-11-12 DIAGNOSIS — M25511 Pain in right shoulder: Secondary | ICD-10-CM | POA: Insufficient documentation

## 2016-11-12 DIAGNOSIS — C7951 Secondary malignant neoplasm of bone: Secondary | ICD-10-CM | POA: Insufficient documentation

## 2016-11-12 DIAGNOSIS — G8220 Paraplegia, unspecified: Secondary | ICD-10-CM | POA: Diagnosis not present

## 2016-11-12 DIAGNOSIS — M25512 Pain in left shoulder: Secondary | ICD-10-CM | POA: Insufficient documentation

## 2016-11-12 DIAGNOSIS — N319 Neuromuscular dysfunction of bladder, unspecified: Secondary | ICD-10-CM | POA: Insufficient documentation

## 2016-11-12 NOTE — Progress Notes (Signed)
Subjective:    Patient ID: Sarah [name], female    DOB: [birth year], 51 y.o.   MRN: [id number]  HPI   Sarah [name] is here in follow up of her thoracic tumor/SCI. She has been working on mobility with PT, she is walking short dx and now standing at home.   Pain levels are good. Her bowels and bladder are stable. Swelling is under control   As far as spasms are concerned they are fairly stable. She does note some cramping/spasms after she exercises. She remains on baclofen   Pain Inventory Average Pain 2 Pain Right Now 2 My pain is tingling and aching  In the last 24 hours, has pain interfered with the following? General activity 0 Relation with others 0 Enjoyment of life 0 What TIME of day is your pain at its worst? night Sleep (in general) Good  Pain is worse with: sitting Pain improves with: medication Relief from Meds: .  Mobility walk without assistance use a cane use a walker ability to climb steps?  no do you drive?  no use a wheelchair transfers alone  Function not employed: date last employed .  Neuro/Psych tingling spasms  Prior Studies Any changes since last visit?  no  Physicians involved in your care Any changes since last visit?  no   Family History  Problem Relation Age of Onset  . Hypertension Mother   . Diabetes Mother   . Hypertension Father   . Stroke Father   . Diabetes Father   . Diabetes Sister   . Diabetes Sister    Social History   Social History  . Marital status: Single    Spouse name: N/A  . Number of children: N/A  . Years of education: N/A   Social History Main Topics  . Smoking status: Never Smoker  . Smokeless tobacco: Never Used  . Alcohol use No  . Drug use: No  . Sexual activity: Not Currently   Other Topics Concern  . Not on file   Social History Narrative  . No narrative on file   Past Surgical History:  Procedure Laterality Date  . LAMINECTOMY N/A 12/14/2013   Procedure: THORACIC LAMINECTOMY FOR TUMOR  THORACIC THREE;  Surgeon: Ashok Pall, MD;  Location: [facility]S;  Service: Neurosurgery;  Laterality: N/A;  THORACIC LAMINECTOMY FOR TUMOR THORACIC THREE  . LAMINECTOMY N/A 07/05/2015   Procedure: THORACIC LAMINECTOMY FOR TUMOR;  Surgeon: Ashok Pall, MD;  Location: [facility] NEUR[facility]S;  Service: Neurosurgery;  Laterality: N/A;  . POSTERIOR LUMBAR FUSION 4 LEVEL N/A 12/30/2013   Procedure: Thoracic one-Thoracic five posterior thoracic fusion with pedicle screws;  Surgeon: Ashok Pall, MD;  Location: [facility] NEUR[facility]S;  Service: Neurosurgery;  Laterality: N/A;  Thoracic one-Thoracic five posterior thoracic fusion with pedicle screws  . THYROIDECTOMY N/A 01/09/2014   Procedure: TOTAL THYROIDECTOMY;  Surgeon: Izora Gala, MD;  Location: [place] [facility];  Service: ENT;  Laterality: N/A;  . TONSILLECTOMY     Past Medical History:  Diagnosis Date  . 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 Chronic back pain   . DDD (degenerative disc disease), cervical   . DJD (degenerative joint disease)   . History of rectal fissure   . HIT (heparin-induced thrombocytopenia) ([place])   . Hypertension   . Obesity   . 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 Pulmonary embolus, right ([place]) 2015  . Rotator cuff tendonitis right   There were no  vitals taken for this visit.  Opioid Risk Score:   Fall Risk Score:  `1  Depression screen PHQ 2/9  Depression screen [facility] 2/9 08/05/2016 02/12/2016 11/13/2015 09/04/2015 12/26/2014 09/19/2014  Decreased Interest 0 0 0 0 0 0  Down, Depressed, Hopeless 0 0 0 0 1 1  PHQ - 2 Score 0 0 0 0 1 1  Altered sleeping - - - 0 - 0  Tired, decreased energy - - - 0 - 1  Change in appetite - - - 0 - 0  Feeling bad or failure about yourself  - - - 0 - 0  Trouble concentrating - - - 0 - 0  Moving slowly or fidgety/restless - - - 0 - 0  Suicidal thoughts - - - 0 - 0  PHQ-9 Score - - - 0 - 2  Difficult doing work/chores - - - Not difficult at all - -  Some recent data  might be hidden     Review of Systems  Constitutional: Negative.   HENT: Negative.   Eyes: Negative.   Respiratory: Negative.   Cardiovascular: Negative.   Gastrointestinal: Negative.   Endocrine: Negative.   Genitourinary: Negative.   Musculoskeletal: Negative.   Skin: Negative.   Allergic/Immunologic: Negative.   Neurological: Negative.   Hematological: Negative.   Psychiatric/Behavioral: Negative.   All other systems reviewed and are negative.      Objective:   Physical Exam Constitutional: She appears well-developed. NAD. Obese HENT: NCAT.  Eyes: EOMI Cardiovascular: RRR. Respiratory: CTA B .  GI: soft BS +.  Musculoskeletal: MTP of first right toe red and swollen, very tender to touch. Shoulders with mild impingement signs bilaterally Neurological: She is alert and oriented.  Flat but speech clear and able to follow commands without difficulty.  BUE 5/5 proximal to distal.  RLE: HF, KE4-/5, ankle dorsi/plantar flexion  4/5 LLE: 3+/5 to 4/5 proximal to distal 4/5.   Extensor tone in lower extremities approximately tr/4 Sensory 1+/2 bilateral LE's.  Skin: Skin is warm and dry.  [name] Psychiatric: pleasant and appropriate as always   Assessment & Plan:  1. 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             -provided rx for scooter. She will discuss with [employer] regarding some sort of trade back for her powered chair. 2. H/o PE/Anticoagulation: Pharmaceutical: Xarelto, 3. Pain Management: Controlled currently on oxycodone prn.   4. Metastatic cancer with T3 pathologic FX with cord compression and new osseous mets T12- L3/S1:  -tumor has been stable  -she has a PET scan scheduled next week 5. Spasticity:  -Continue baclofen 20 mg 3 X day    6. Neurogenic bowel :  -colace q am   7. Neurogenic bladder: emptying bladder 8. Gout right first toe: colchicine prn 9. Bilateral shoulder pain---improving   15 minutes of face to face patient care time were spent during this visit. All questions were encouraged and answered. Follow up in 4 months.

## 2016-11-12 NOTE — Patient Instructions (Signed)
PLEASE FEEL FREE TO CALL OUR OFFICE WITH ANY PROBLEMS OR QUESTIONS ([phone])      

## 2016-11-13 ENCOUNTER — Ambulatory Visit: Payer: Medicare Other | Admitting: Physical Therapy

## 2016-11-14 ENCOUNTER — Ambulatory Visit: Payer: Medicare Other | Admitting: Physical Therapy

## 2016-11-17 ENCOUNTER — Ambulatory Visit: Payer: Medicare Other | Admitting: Physical Therapy

## 2016-11-17 DIAGNOSIS — M6281 Muscle weakness (generalized): Secondary | ICD-10-CM | POA: Diagnosis not present

## 2016-11-17 DIAGNOSIS — R2689 Other abnormalities of gait and mobility: Secondary | ICD-10-CM

## 2016-11-17 DIAGNOSIS — G8222 Paraplegia, incomplete: Secondary | ICD-10-CM | POA: Diagnosis not present

## 2016-11-18 ENCOUNTER — Telehealth: Payer: Self-pay

## 2016-11-18 ENCOUNTER — Ambulatory Visit (HOSPITAL_COMMUNITY): Payer: Medicare Other

## 2016-11-18 ENCOUNTER — Other Ambulatory Visit: Payer: Medicare Other

## 2016-11-18 NOTE — Telephone Encounter (Signed)
Pt cancelled PET scan today. Gave her the number for Central Scheduling so that she could reschedule her PET. Preference prior to appt with MD 8/30.

## 2016-11-18 NOTE — Therapy (Signed)
[facility] [address] [place], [place], [address] Phone: [phone]   Fax:  [phone]  Physical Therapy Treatment  Patient Details  Name: Sarah [name] MRN: [id number] Date of Birth: Feb 23, 1966 Referring Provider: Danella Sensing, NP  Encounter Date: 11/17/2016      PT End of Session - 11/18/16 2033    Visit Number 20  G10     Number of Visits 29   Date for PT Re-Evaluation 12/18/16   Authorization Type UHC Medicare   Authorization Time Period 11-17-16 - 01-16-17   PT Start Time 0932   PT Stop Time 1015   PT Time Calculation (min) 43 min      Past Medical History:  Diagnosis Date  . 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 Chronic back pain   . DDD (degenerative disc disease), cervical   . DJD (degenerative joint disease)   . History of rectal fissure   . HIT (heparin-induced thrombocytopenia) ([place])   . Hypertension   . Obesity   . 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 Pulmonary embolus, right ([place]) 2015  . Rotator cuff tendonitis right    Past Surgical History:  Procedure Laterality Date  . LAMINECTOMY N/A 12/14/2013   Procedure: THORACIC LAMINECTOMY FOR TUMOR THORACIC THREE;  Surgeon: Ashok Pall, MD;  Location: [facility];  Service: Neurosurgery;  Laterality: N/A;  THORACIC LAMINECTOMY FOR TUMOR THORACIC THREE  . LAMINECTOMY N/A 07/05/2015   Procedure: THORACIC LAMINECTOMY FOR TUMOR;  Surgeon: Ashok Pall, MD;  Location: [facility];  Service: Neurosurgery;  Laterality: N/A;  . POSTERIOR LUMBAR FUSION 4 LEVEL N/A 12/30/2013   Procedure: Thoracic one-Thoracic five posterior thoracic fusion with pedicle screws;  Surgeon: Ashok Pall, MD;  Location: [place] [facility];  Service: Neurosurgery;  Laterality: N/A;  Thoracic one-Thoracic five posterior thoracic fusion with pedicle screws  . THYROIDECTOMY N/A 01/09/2014   Procedure: TOTAL THYROIDECTOMY;  Surgeon: Izora Gala,  MD;  Location: [facility];  Service: ENT;  Laterality: N/A;  . TONSILLECTOMY      There were no vitals filed for this visit.      Subjective Assessment - 11/18/16 2022    Subjective Pt states she is standing at home but is not walking at home - states she has not walked since she was at therapy 2 weeks ago   Pertinent History thoracic laminectomy for spinal tumor 12-14-13; 07-05-15 for 2nd sx due to tumor regrowth:  DDD; Rt RTC tendonitis; DDD and DJD:  chronic back pain   Patient Stated Goals get legs stronger and to walk   Currently in Pain? No/denies                         [facility] Adult PT Treatment/Exercise - 11/18/16 0001      Transfers   Transfers Sit to Stand   Sit to Stand 4: Min assist;3: Mod assist  pt improves with repetition   Sit to Stand Details Verbal cues for technique  needs assist for controlled descent   Stand to Sit 3: Mod assist   Number of Reps Other reps (comment)  3 reps from high/low mat table     Ambulation/Gait   Ambulation/Gait Yes   Ambulation/Gait Assistance 4: Min assist  2nd person present to push wheelchair behind her   Ambulation/Gait Assistance Details cues to tuck hips and stand erect   Ambulation Distance (Feet) 30 Feet  Assistive device Rolling walker   Gait Pattern Step-through pattern;Decreased hip/knee flexion - right;Decreased hip/knee flexion - left;Decreased dorsiflexion - right;Decreased dorsiflexion - left   Ambulation Surface Level;Indoor     Pt practiced step negotiation inside // bars with +2 mod assist; 2" step used initially with pt stepping up and off of this step Inside //bars with heavy UE support on bars;  4" step used after negotiation of 2" step x 2 reps; pt stepped up with RLE first, followed by LLE; pt performed 1st rep with mod assist;  2nd rep attempted but pt unable to step up due to fatigue - able to lift RLE up onto step but fatigued after this activity;  Pt very fearly With stepping down to floor  from both 2" and 4" steps   Pt unable to push up to standing with hands on mat/chair - needs both hands on RW to assist in pulling herself up to standing          PT Short Term Goals - 10/08/16 1456      PT SHORT TERM GOAL #6   Title Amb. 20' at home with RW with +1 mod assist.  10-02-16   Time --   Period --   Status Partially Met     PT SHORT TERM GOAL #7   Title Perform sit to stand from wheelchair to RW with +1 min assist.  10-02-16   Baseline 10/08/16: needed varied assistance from min<>mod assist    Time --   Period --   Status Partially Met           PT Long Term Goals - 11/18/16 2035      PT LONG TERM GOAL #1   Title --   Status --     PT LONG TERM GOAL #2   Title Stand at countertop (or at trampoline at home) modified independently for at least 5" per pt report to demo increased LE strength and improved standing tolerance   Baseline Pt reports standing for approx. 1" at home - 11-17-16   Status On-going   Target Date 12/18/16     PT LONG TERM GOAL #3   Title Pt will perform sit to stand from wheelchair to walker with min assist of 1 person.   Time 4   Period Weeks   Status New   Target Date 12/18/16     PT LONG TERM GOAL #4   Title Negotiate 4" step inside //bars with +2 min assist.   Baseline +2 mod assist needed   Time 4   Status New   Target Date 12/18/16     PT LONG TERM GOAL #5   Title Pt will tolerate rolling prone and maintaining position for at least 10 mins for stretching hip flexors.   09-01-16 TARGET DATE   Status Achieved     PT LONG TERM GOAL #6   Title Pt will amb. 30' at home with RW with min assist +1   Time 4   Period Weeks   Status New   Target Date 12/18/16               Plan - 11/18/16 2105    Clinical Impression Statement Pt has met amb. goal of 30' in clinic but reports she is not amb. at home due to lack of assistance of 2 people; pt negotiated 2" and 4" steps inside // bars today for first time, but does not  have sufficient muscle strength nor muscle endurance to  negotiate standard height steps of 6"      Rehab Potential Good   Clinical Impairments Affecting Rehab Potential severity of impairments and length of time since onset   PT Frequency 2x / week   PT Duration 4 weeks  renewal completed December 17, 2016   PT Treatment/Interventions ADLs/Self Care Home Management;Therapeutic exercise;Balance training;Therapeutic activities;Functional mobility training;Gait training;Neuromuscular re-education;Patient/family education;DME Instruction;Wheelchair mobility training;Passive range of motion   PT Next Visit Plan cont gait training; sit to/from stand transfers   PT Home Exercise Plan stretches and LE strengthening, continue to work on sit/stand transfers, gait with RW. standing balance activities   Consulted and Agree with Plan of Care Patient      Patient will benefit from skilled therapeutic intervention in order to improve the following deficits and impairments:  Abnormal gait, Difficulty walking, Decreased balance, Decreased activity tolerance, Impaired tone, Decreased strength, Increased edema  Visit Diagnosis: Other abnormalities of gait and mobility - Plan: PT plan of care cert/re-cert  Muscle weakness (generalized) - Plan: PT plan of care cert/re-cert       G-Codes - 2016/12/17 06/14/43    Functional Assessment Tool Used (Outpatient Only) Pt needs min assist for sit to stand from wheelchair to RW and mod assist with stand to sit:  amb. 30' with RW with min assist +2   Functional Limitation Mobility: Walking and moving around   Mobility: Walking and Moving Around Current Status ([id number]) At least 60 percent but less than 80 percent impaired, limited or restricted   Mobility: Walking and Moving Around Goal Status [phone]) At least 40 percent but less than 60 percent impaired, limited or restricted      Problem List Patient Active Problem List   Diagnosis Date Noted  . Bilateral rotator cuff syndrome  02/12/2016  . E. coli UTI 08/01/2015  . Constipation due to neurogenic bowel 08/01/2015  . Muscle spasticity   . 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 Spastic paraparesis   . Neurogenic bladder   . History of pulmonary embolism   . Post-operative pain   . Metastatic cancer ([place])   . Steroid-induced hyperglycemia   . Benign essential HTN   . Acute blood loss anemia   . Leukocytosis   . Depression   . Obstipation   . Thoracic spine tumor 07/06/2015  . 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 ([place]) 07/06/2015  . Cord compression ([place])   . Encounter for intubation   . Lower extremity weakness   . Respiratory failure ([place])   . Postoperative hypothyroidism   . Peripheral edema 06/07/2015  . Hypothyroidism, postop 03/08/2015  . Secondary malignant neoplasm of vertebral column ([place]) 03/08/2015  . Spasticity 09/19/2014  . Dysuria 09/04/2014  . Hypertension 05/01/2014  . Ingrown right big toenail 05/01/2014  . Hypokalemia 05/01/2014  . GERD (gastroesophageal reflux disease) 02/14/2014  . Dysphagia, pharyngoesophageal phase 02/06/2014  . Type 2 diabetes mellitus without complication ([place]) 65/46/5035  . Constipation   . Neurogenic bowel   . 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 Metastatic cancer to spine ([place]) 01/19/2014  . Rotator cuff tendonitis   . Acute postoperative respiratory failure ([place]) 01/09/2014  . 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 Hurthle cell carcinoma of thyroid ([place]) 12/29/2013  . Leg weakness, bilateral 12/13/2013    [place], PT 11/18/2016, 9:10 PM  [facility] [address] [place], [place], [address] Phone: [phone]   Fax:  [phone]  Name: KAYSEY BERNDT MRN: [id number] Date of  Birth: 05-Jan-1966

## 2016-11-20 ENCOUNTER — Ambulatory Visit: Payer: Medicare Other | Admitting: Hematology

## 2016-11-20 ENCOUNTER — Telehealth: Payer: Self-pay | Admitting: *Deleted

## 2016-11-20 NOTE — Telephone Encounter (Signed)
Pt scheduled with [name] this morning, pt unable to reschedule PET prior to MD apt today.  Pt advised to reschedule MD & lab visit after PET scan has been completed.  Pt verbalized understanding.  Pt will contact our office when PET has been rescheduled.  Verified pt has contact number for central scheduling & [place] main number.

## 2016-11-21 ENCOUNTER — Ambulatory Visit: Payer: Medicare Other | Admitting: Physical Therapy

## 2016-11-21 ENCOUNTER — Encounter: Payer: Self-pay | Admitting: Physical Therapy

## 2016-11-21 DIAGNOSIS — R2689 Other abnormalities of gait and mobility: Secondary | ICD-10-CM | POA: Diagnosis not present

## 2016-11-21 DIAGNOSIS — G8222 Paraplegia, incomplete: Secondary | ICD-10-CM | POA: Diagnosis not present

## 2016-11-21 DIAGNOSIS — M6281 Muscle weakness (generalized): Secondary | ICD-10-CM

## 2016-11-22 NOTE — Therapy (Signed)
[facility] [address] [place], [place], [address] Phone: [phone]   Fax:  [phone]  Physical Therapy Treatment  Patient Details  Name: Sarah [name] MRN: [id number] Date of Birth: 1966-02-14 Referring Provider: Danella Sensing, NP  Encounter Date: 11/21/2016      PT End of Session - 11/21/16 1108    Visit Number 21  G-code due visit number 30   Number of Visits 29   Date for PT Re-Evaluation 12/18/16   Authorization Type UHC Medicare   Authorization Time Period 11-17-16 - 01-16-17   PT Start Time 1102   PT Stop Time 1141   PT Time Calculation (min) 39 min      Past Medical History:  Diagnosis Date  . 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 Chronic back pain   . DDD (degenerative disc disease), cervical   . DJD (degenerative joint disease)   . History of rectal fissure   . HIT (heparin-induced thrombocytopenia) ([place])   . Hypertension   . Obesity   . 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 ([place])   . Pulmonary embolus, right ([place]) 2015  . Rotator cuff tendonitis right    Past Surgical History:  Procedure Laterality Date  . LAMINECTOMY N/A 12/14/2013   Procedure: THORACIC LAMINECTOMY FOR TUMOR THORACIC THREE;  Surgeon: Ashok Pall, MD;  Location: [facility];  Service: Neurosurgery;  Laterality: N/A;  THORACIC LAMINECTOMY FOR TUMOR THORACIC THREE  . LAMINECTOMY N/A 07/05/2015   Procedure: THORACIC LAMINECTOMY FOR TUMOR;  Surgeon: Ashok Pall, MD;  Location: [facility];  Service: Neurosurgery;  Laterality: N/A;  . POSTERIOR LUMBAR FUSION 4 LEVEL N/A 12/30/2013   Procedure: Thoracic one-Thoracic five posterior thoracic fusion with pedicle screws;  Surgeon: Ashok Pall, MD;  Location: [facility];  Service: Neurosurgery;  Laterality: N/A;  Thoracic one-Thoracic five posterior thoracic fusion with pedicle screws  . THYROIDECTOMY N/A 01/09/2014   Procedure: TOTAL THYROIDECTOMY;   Surgeon: Izora Gala, MD;  Location: [facility];  Service: ENT;  Laterality: N/A;  . TONSILLECTOMY      There were no vitals filed for this visit.      Subjective Assessment - 11/21/16 1106    Subjective Was sore after last session, however continued standing despite this. Has not walked due to not having 2 person assistance. Hopes to this weekend.    [name] accompained by: Family member   Pertinent History thoracic laminectomy for spinal tumor 12-14-13; 07-05-15 for 2nd sx due to tumor regrowth:  DDD; Rt RTC tendonitis; DDD and DJD:  chronic back pain   Patient Stated Goals get legs stronger and to walk   Currently in Pain? Yes   Pain Score 2    Pain Location Back   Pain Orientation Mid;Lower   Pain Descriptors / Indicators Aching   Pain Type Chronic pain   Pain Onset More than a month ago   Pain Frequency Intermittent   Aggravating Factors  immobility   Pain Relieving Factors tylenol, stretching           [facility] Adult PT Treatment/Exercise - 11/21/16 1112      Transfers   Transfers Sit to Stand;Stand to Sit   Sit to Stand 4: Min assist;3: Mod assist   Sit to Stand Details Verbal cues for sequencing;Verbal cues for technique;Verbal cues for safe use of DME/AE   Sit to Stand Details (indicate cue type and reason) mod assist to stand from wheelchair  to RW with pt pulling on RW. then once pt was on mat worked on standing with left hand on mat, right on RW x 4 reps with min assist and mod cues needed.                         Stand to Sit 4: Min assist;3: Mod assist   Stand to Sit Details (indicate cue type and reason) Verbal cues for technique;Verbal cues for sequencing;Verbal cues for safe use of DME/AE   Stand to Sit Details mod assist to sit to wheelchair after gait with pt holding RW. then worked on reaching back with left hand to mat to assist with sitting down. pt was also able to reach back to wheelchair with left hand to assist with sitting after pivot transfer with RW mat to  wheelchair.    Stand Pivot Transfers 4: Min assist   Stand Pivot Transfer Details (indicate cue type and reason) with RW wheelchair<>mat with second person close for safety. cues needed on sequencing and technique. most assistance needed with RW advancement during the transfer.      Ambulation/Gait   Ambulation/Gait Yes   Ambulation/Gait Assistance 4: Min assist  with second person for safety/chair follow   Ambulation/Gait Assistance Details cues on posture, hip extension and step length with gait.    Ambulation Distance (Feet) 32 Feet   Assistive device Rolling walker   Gait Pattern Step-through pattern;Decreased hip/knee flexion - right;Decreased hip/knee flexion - left;Decreased dorsiflexion - right;Decreased dorsiflexion - left   Ambulation Surface Level;Indoor          PT Short Term Goals - 10/08/16 1456      PT SHORT TERM GOAL #6   Title Amb. 20' at home with RW with +1 mod assist.  10-02-16   Time --   Period --   Status Partially Met     PT SHORT TERM GOAL #7   Title Perform sit to stand from wheelchair to RW with +1 min assist.  10-02-16   Baseline 10/08/16: needed varied assistance from min<>mod assist    Time --   Period --   Status Partially Met           PT Long Term Goals - 11/18/16 2035      PT LONG TERM GOAL #1   Title --   Status --     PT LONG TERM GOAL #2   Title Stand at countertop (or at trampoline at home) modified independently for at least 5" per pt report to demo increased LE strength and improved standing tolerance   Baseline Pt reports standing for approx. 1" at home - 11-17-16   Status On-going   Target Date 12/18/16     PT LONG TERM GOAL #3   Title Pt will perform sit to stand from wheelchair to walker with min assist of 1 person.   Time 4   Period Weeks   Status New   Target Date 12/18/16     PT LONG TERM GOAL #4   Title Negotiate 4" step inside //bars with +2 min assist.   Baseline +2 mod assist needed   Time 4   Status New    Target Date 12/18/16     PT LONG TERM GOAL #5   Title Pt will tolerate rolling prone and maintaining position for at least 10 mins for stretching hip flexors.   09-01-16 TARGET DATE   Status Achieved     PT LONG  TERM GOAL #6   Title Pt will amb. 30' at home with RW with min assist +1   Time 4   Period Weeks   Status New   Target Date 12/18/16               Plan - 11/21/16 1109    Clinical Impression Statement Today's skilled session continued to address gait and transfers with RW without any issues reported in session. Pt is progressing slowly toward goals and should benefit from continued PT to progress toward unmet goals.    Rehab Potential Good   Clinical Impairments Affecting Rehab Potential severity of impairments and length of time since onset   PT Frequency 2x / week   PT Duration 4 weeks  renewal completed 11-17-16   PT Treatment/Interventions ADLs/Self Care Home Management;Therapeutic exercise;Balance training;Therapeutic activities;Functional mobility training;Gait training;Neuromuscular re-education;Patient/family education;DME Instruction;Wheelchair mobility training;Passive range of motion   PT Next Visit Plan cont gait training; sit to/from stand transfers   PT Home Exercise Plan stretches and LE strengthening, continue to work on sit/stand transfers, gait with RW. standing balance activities   Consulted and Agree with Plan of Care Patient      Patient will benefit from skilled therapeutic intervention in order to improve the following deficits and impairments:  Abnormal gait, Difficulty walking, Decreased balance, Decreased activity tolerance, Impaired tone, Decreased strength, Increased edema  Visit Diagnosis: Other abnormalities of gait and mobility  Muscle weakness (generalized)  Paraplegia, incomplete ([place])     Problem List Patient Active Problem List   Diagnosis Date Noted  . Bilateral rotator cuff syndrome 02/12/2016  . E. coli UTI 08/01/2015  .  Constipation due to neurogenic bowel 08/01/2015  . Muscle spasticity   . 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 Spastic paraparesis   . Neurogenic bladder   . History of pulmonary embolism   . Post-operative pain   . Metastatic cancer ([place])   . Steroid-induced hyperglycemia   . Benign essential HTN   . Acute blood loss anemia   . Leukocytosis   . Depression   . Obstipation   . Thoracic spine tumor 07/06/2015  . 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 Cord compression ([place])   . Encounter for intubation   . Lower extremity weakness   . Respiratory failure ([place])   . Postoperative hypothyroidism   . Peripheral edema 06/07/2015  . Hypothyroidism, postop 03/08/2015  . 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 Spasticity 09/19/2014  . Dysuria 09/04/2014  . Hypertension 05/01/2014  . Ingrown right big toenail 05/01/2014  . Hypokalemia 05/01/2014  . GERD (gastroesophageal reflux disease) 02/14/2014  . Dysphagia, pharyngoesophageal phase 02/06/2014  . Type 2 diabetes mellitus without complication ([place]) 49/75/3005  . Constipation   . Neurogenic bowel   . 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 ([place]) 01/20/2014  . Metastatic cancer to spine ([place]) 01/19/2014  . Rotator cuff tendonitis   . Acute postoperative respiratory failure ([place]) 01/09/2014  . 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 Leg weakness, bilateral 12/13/2013    Willow Ora, PTA, [facility] Outpatient Neuro [facility] [address], [place], [place] [address] [phone] 11/22/16, 3:16 PM   Name: KENLY HENCKEL MRN: [id number] Date of Birth: 06/26/1965

## 2016-11-25 ENCOUNTER — Ambulatory Visit: Payer: Medicare Other | Admitting: Physical Therapy

## 2016-11-27 ENCOUNTER — Ambulatory Visit: Payer: Medicare Other | Admitting: Physical Therapy

## 2016-12-01 ENCOUNTER — Ambulatory Visit (HOSPITAL_COMMUNITY): Payer: Medicare Other

## 2016-12-02 ENCOUNTER — Other Ambulatory Visit: Payer: Self-pay | Admitting: Physical Medicine & Rehabilitation

## 2016-12-02 ENCOUNTER — Ambulatory Visit: Payer: Medicare Other | Attending: Registered Nurse | Admitting: Physical Therapy

## 2016-12-02 DIAGNOSIS — G8220 Paraplegia, unspecified: Secondary | ICD-10-CM

## 2016-12-02 DIAGNOSIS — M10071 Idiopathic gout, right ankle and foot: Secondary | ICD-10-CM

## 2016-12-02 DIAGNOSIS — M6281 Muscle weakness (generalized): Secondary | ICD-10-CM | POA: Insufficient documentation

## 2016-12-02 DIAGNOSIS — M75102 Unspecified rotator cuff tear or rupture of left shoulder, not specified as traumatic: Secondary | ICD-10-CM

## 2016-12-02 DIAGNOSIS — R2689 Other abnormalities of gait and mobility: Secondary | ICD-10-CM | POA: Insufficient documentation

## 2016-12-02 DIAGNOSIS — C73 Malignant neoplasm of thyroid gland: Secondary | ICD-10-CM

## 2016-12-02 DIAGNOSIS — I10 Essential (primary) hypertension: Secondary | ICD-10-CM

## 2016-12-02 DIAGNOSIS — M75101 Unspecified rotator cuff tear or rupture of right shoulder, not specified as traumatic: Secondary | ICD-10-CM

## 2016-12-03 ENCOUNTER — Other Ambulatory Visit: Payer: Self-pay

## 2016-12-03 ENCOUNTER — Ambulatory Visit: Payer: Medicare Other | Admitting: Hematology

## 2016-12-03 ENCOUNTER — Other Ambulatory Visit: Payer: Self-pay | Admitting: Hematology

## 2016-12-03 DIAGNOSIS — C73 Malignant neoplasm of thyroid gland: Secondary | ICD-10-CM

## 2016-12-03 MED ORDER — LENVATINIB (18 MG DAILY DOSE) 10 MG & 2 X 4 MG PO CPPK: 18.0000 mg | ORAL_CAPSULE | Freq: Every day | ORAL | 2 refills | Status: DC

## 2016-12-03 NOTE — Therapy (Signed)
[facility] [address] [place], [place], [address] Phone: [phone]   Fax:  [phone]  Physical Therapy Treatment  Patient Details  Name: Sarah [name] MRN: [id number] Date of Birth: 10-Aug-1965 Referring Provider: Danella Sensing, NP  Encounter Date: 12/02/2016      PT End of Session - 12/03/16 1538    Visit Number 22  G2   Number of Visits 29   Date for PT Re-Evaluation 12/18/16   Authorization Type [employer]   Authorization Time Period 11-17-16 - 01-16-17   PT Start Time 1017   PT Stop Time 1100   PT Time Calculation (min) 43 min   Equipment Utilized During Treatment Gait belt      Past Medical History:  Diagnosis Date  . 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 Chronic back pain   . DDD (degenerative disc disease), cervical   . DJD (degenerative joint disease)   . History of rectal fissure   . HIT (heparin-induced thrombocytopenia) ([place])   . Hypertension   . Obesity   . 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 Pulmonary embolus, right ([place]) 2015  . Rotator cuff tendonitis right    Past Surgical History:  Procedure Laterality Date  . LAMINECTOMY N/A 12/14/2013   Procedure: THORACIC LAMINECTOMY FOR TUMOR THORACIC THREE;  Surgeon: Ashok Pall, MD;  Location: [facility];  Service: Neurosurgery;  Laterality: N/A;  THORACIC LAMINECTOMY FOR TUMOR THORACIC THREE  . LAMINECTOMY N/A 07/05/2015   Procedure: THORACIC LAMINECTOMY FOR TUMOR;  Surgeon: Ashok Pall, MD;  Location: [facility];  Service: Neurosurgery;  Laterality: N/A;  . POSTERIOR LUMBAR FUSION 4 LEVEL N/A 12/30/2013   Procedure: Thoracic one-Thoracic five posterior thoracic fusion with pedicle screws;  Surgeon: Ashok Pall, MD;  Location: [facility];  Service: Neurosurgery;  Laterality: N/A;  Thoracic one-Thoracic five posterior thoracic fusion with pedicle screws  . THYROIDECTOMY N/A 01/09/2014   Procedure: TOTAL THYROIDECTOMY;  Surgeon: Izora Gala, MD;  Location: [facility];  Service: ENT;  Laterality: N/A;  . TONSILLECTOMY      There were no vitals filed for this visit.      Subjective Assessment - 12/03/16 1531    Subjective Pt states she has been standing at home but has not been walking; tried to transfer into a friend's SUV ([employer]) but she didn't have her walker so she was unable to turn around in order to sit on the car seat    Pertinent History thoracic laminectomy for spinal tumor 12-14-13; 07-05-15 for 2nd sx due to tumor regrowth:  DDD; Rt RTC tendonitis; DDD and DJD:  chronic back pain   Patient Stated Goals get legs stronger and to walk   Currently in Pain? No/denies                         [facility] Adult PT Treatment/Exercise - 12/03/16 0001      Transfers   Transfers Sit to Stand;Stand to Sit   Sit to Stand 4: Min assist;3: Mod assist   Sit to Stand Details Verbal cues for sequencing;Verbal cues for technique;Tactile cues for posture   Sit to Stand Details (indicate cue type and reason) mod assist for sit to stand from wheelchair to RW with 1 UE on RW   Stand to Sit 3: Mod assist   Stand to Sit Details (indicate cue type and reason) Tactile cues for  placement;Verbal cues for technique;Verbal cues for precautions/safety   Stand Pivot Transfers 3: Mod assist   Stand Pivot Transfer Details (indicate cue type and reason) 1 person following with wheelchair due to pt's fear/anxiety      Ambulation/Gait   Ambulation/Gait Yes   Ambulation/Gait Assistance 4: Min assist   Ambulation/Gait Assistance Details cues to tuck hips and stand erect   Ambulation Distance (Feet) 15 Feet  x 2 reps; 8' x1 rep in curved path to work on turning w/RW   Merchandiser, retail Pattern Step-through pattern   Ambulation Surface Level;Indoor                  PT Short Term Goals - 10/08/16 1456      PT SHORT TERM GOAL #6   Title Amb. 20' at  home with RW with +1 mod assist.  10-02-16   Time --   Period --   Status Partially Met     PT SHORT TERM GOAL #7   Title Perform sit to stand from wheelchair to RW with +1 min assist.  10-02-16   Baseline 10/08/16: needed varied assistance from min<>mod assist    Time --   Period --   Status Partially Met           PT Long Term Goals - 11/18/16 2035      PT LONG TERM GOAL #1   Title --   Status --     PT LONG TERM GOAL #2   Title Stand at countertop (or at trampoline at home) modified independently for at least 5" per pt report to demo increased LE strength and improved standing tolerance   Baseline Pt reports standing for approx. 1" at home - 11-17-16   Status On-going   Target Date 12/18/16     PT LONG TERM GOAL #3   Title Pt will perform sit to stand from wheelchair to walker with min assist of 1 person.   Time 4   Period Weeks   Status New   Target Date 12/18/16     PT LONG TERM GOAL #4   Title Negotiate 4" step inside //bars with +2 min assist.   Baseline +2 mod assist needed   Time 4   Status New   Target Date 12/18/16     PT LONG TERM GOAL #5   Title Pt will tolerate rolling prone and maintaining position for at least 10 mins for stretching hip flexors.   09-01-16 TARGET DATE   Status Achieved     PT LONG TERM GOAL #6   Title Pt will amb. 30' at home with RW with min assist +1   Time 4   Period Weeks   Status New   Target Date 12/18/16               Plan - 12/03/16 1538    Clinical Impression Statement Pt is very anxious and fearful of falling with ambulation;  pt performed gait training in curved path with turning to sit in chair to practice turning around with use of RW and to practice negotiation of RW over carpet/threshold/tiled surface;  pt very nervous with stand to sit transfer, requiring mod to max assist due to fear; pt much improved on 3rd rep of amb. 8' from mat to wheelchair with turn toward right side to sit in wheelchair    Rehab  Potential Good   Clinical Impairments Affecting Rehab Potential severity of impairments and length of  time since onset   PT Frequency 2x / week   PT Duration 4 weeks   PT Treatment/Interventions ADLs/Self Care Home Management;Therapeutic exercise;Balance training;Therapeutic activities;Functional mobility training;Gait training;Neuromuscular re-education;Patient/family education;DME Instruction;Wheelchair mobility training;Passive range of motion   PT Next Visit Plan cont gait training; sit to/from stand transfers   Consulted and Agree with Plan of Care Patient      Patient will benefit from skilled therapeutic intervention in order to improve the following deficits and impairments:  Abnormal gait, Difficulty walking, Decreased balance, Decreased activity tolerance, Impaired tone, Decreased strength, Increased edema  Visit Diagnosis: Other abnormalities of gait and mobility  Muscle weakness (generalized)     Problem List Patient Active Problem List   Diagnosis Date Noted  . Bilateral rotator cuff syndrome 02/12/2016  . E. coli UTI 08/01/2015  . Constipation due to neurogenic bowel 08/01/2015  . Muscle spasticity   . 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 Spastic paraparesis   . Neurogenic bladder   . History of pulmonary embolism   . Post-operative pain   . Metastatic cancer ([place])   . Steroid-induced hyperglycemia   . Benign essential HTN   . Acute blood loss anemia   . Leukocytosis   . Depression   . Obstipation   . Thoracic spine tumor 07/06/2015  . 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 Cord compression ([place])   . Encounter for intubation   . Lower extremity weakness   . Respiratory failure ([place])   . Postoperative hypothyroidism   . Peripheral edema 06/07/2015  . Hypothyroidism, postop 03/08/2015  . 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 Spasticity 09/19/2014  . Dysuria 09/04/2014  . Hypertension 05/01/2014  .  Ingrown right big toenail 05/01/2014  . Hypokalemia 05/01/2014  . GERD (gastroesophageal reflux disease) 02/14/2014  . Dysphagia, pharyngoesophageal phase 02/06/2014  . Type 2 diabetes mellitus without complication ([place]) 16/94/5038  . Constipation   . Neurogenic bowel   . 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 Metastatic cancer to spine ([place]) 01/19/2014  . Rotator cuff tendonitis   . Acute postoperative respiratory failure ([place]) 01/09/2014  . 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 Leg weakness, bilateral 12/13/2013    Alda Lea, PT 12/03/2016, 3:43 PM  [facility] [address] [place], [place], [address] Phone: [phone]   Fax:  [phone]  Name: ONITA PFLUGER MRN: [id number] Date of Birth: 05-14-1965

## 2016-12-04 ENCOUNTER — Ambulatory Visit: Payer: Medicare Other | Admitting: Physical Therapy

## 2016-12-04 MED FILL — LENVIMA 18 MG DAILY DOSE: 10 MG & | 30 days supply | Qty: 90 | Fill #0

## 2016-12-09 DIAGNOSIS — C73 Malignant neoplasm of thyroid gland: Secondary | ICD-10-CM | POA: Diagnosis not present

## 2016-12-09 DIAGNOSIS — G839 Paralytic syndrome, unspecified: Secondary | ICD-10-CM | POA: Diagnosis not present

## 2016-12-09 DIAGNOSIS — G8220 Paraplegia, unspecified: Secondary | ICD-10-CM | POA: Diagnosis not present

## 2016-12-10 ENCOUNTER — Telehealth: Payer: Self-pay

## 2016-12-10 ENCOUNTER — Other Ambulatory Visit: Payer: Self-pay | Admitting: *Deleted

## 2016-12-10 ENCOUNTER — Telehealth: Payer: Self-pay | Admitting: *Deleted

## 2016-12-10 DIAGNOSIS — D34 Benign neoplasm of thyroid gland: Secondary | ICD-10-CM

## 2016-12-10 NOTE — Telephone Encounter (Signed)
Pt called to see if outside provider could send [place] orders for upcoming lab draw.  Pt also has lab apt schedule at [facility] this week.  Pt would like to have all labs drawn at once as to not repeat same labs.  Advised pt to have outside physician's office fax [place] orders.  Fax number provided.  Scheduling message sent to reschedule lab apt for tomorrow, 9/20

## 2016-12-10 NOTE — Telephone Encounter (Signed)
Scheduled appointment for 9/20 @11 :30 per MD request on 9/19 los.

## 2016-12-11 ENCOUNTER — Other Ambulatory Visit: Payer: Medicare Other

## 2016-12-11 ENCOUNTER — Encounter (HOSPITAL_COMMUNITY)
Admission: RE | Admit: 2016-12-11 | Discharge: 2016-12-11 | Disposition: A | Discharge status: Home / Self Care | Payer: Medicare Other | Source: Ambulatory Visit | Attending: Hematology | Admitting: Hematology

## 2016-12-11 DIAGNOSIS — C73 Malignant neoplasm of thyroid gland: Secondary | ICD-10-CM | POA: Diagnosis not present

## 2016-12-11 DIAGNOSIS — C7951 Secondary malignant neoplasm of bone: Secondary | ICD-10-CM | POA: Insufficient documentation

## 2016-12-11 LAB — GLUCOSE, CAPILLARY: Glucose-Capillary: 96 mg/dL (ref 65–99)

## 2016-12-11 MED ORDER — FLUDEOXYGLUCOSE F - 18 (FDG) INJECTION
12.6000 | Freq: Once | INTRAVENOUS | Status: AC | PRN
  Administered 2016-12-11: 12.6 via INTRAVENOUS

## 2016-12-11 NOTE — Progress Notes (Signed)
Sarah [name] Kitchen    HEMATOLOGY/ONCOLOGY CLINIC NOTE  Date of Service: 12/12/16   Patient Care Team: Sarah Skiff, MD as PCP - General (Family Medicine) Sarah Staggers, MD as Consulting Physician (Physical Medicine and Rehabilitation) Sarah Pall, MD as Consulting Physician (Neurosurgery) Sarah Genera, MD as Consulting Physician (Hematology) Sarah Pi, MD as Consulting Physician (Endocrinology)   Sarah Bailiff MD (Spine [place]) [phone]. 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  01/09/2014- patient underwent total thyroidectomy by Dr. Elie [name]. Pathology showed Hurthle cell carcinoma with no perineural invasion, presence of lymphovascular invasion and extra thyroidal extension. p T3, p N0, Mx  Received 30 grays in 10 fractions to her C7-T6 lesion finished in November 2015  Received 135 mCi of iodine 131 on 07/21/2014. Whole-body scan was apparently negative.  07/05/2015-07/10/2015 admitted to [facility] for leg weakness. MRI of the T-spine showed recurrent tumor causing spinal cord compression. Patient was emergently taken to the or and underwent a redo thoracic laminectomy of T3 and partial tumor resection.   08/07/2015 PET/CT scan of the chest abdomen pelvis showed slight enlargement of FDG avid left level III and level IV lymph nodes concerning for worsening nodal metastases. Slight enlargement of the hypermetabolic right superior mediastinal mass and multiple metabolically active pulmonary nodules representing worsening metastases. T3 vertebral body and bilateral pedicle metastases status  post posture metallic fusion. No residual uptake in the thyroidectomy bed  Thyroglobulin level was 11.7 (thyroglobulin antibody present 9.8)   HISTORY OF PRESENTING ILLNESS:   Sarah [name] is a wonderful 51 y.o. female who has been referred to [place] by Dr Sarah [name] (medical oncology at [facility]) for help with local comanagement of the patient's metastatic Hurthle cell thyroid carcinoma with details as noted above. Patient had recent hospitalization from 4/13/7 to 07/10/2015 at [facility] for lower extremity weakness and was noted to have cord compression from recurrent tumor at T3. She is status post redo thoracic laminectomy of T3 and partial tumor resection to try to decompress the core. Pathology was consistent with metastatic Hurthle cell carcinoma.  She has been on dexamethasone 2 mg by mouth twice a day. Continue to have lower extremity weakness and is currently in a skilled nursing facility getting rehabilitation. She feels her lower extremity weakness is possibly getting somewhat better.  Patient had a repeat PET/CT scan at [facility] on 08/07/2015 as noted above which shows progression of her metastatic disease including nodal metastases , pulmonary nodules and right superior mediastinal mass.  MRI of the spine on 08/14/2015 showed improved but persistent cord compression at T3 level.  She was started by Dr. Leamon [name] on Lenvatinib and 14 mg by mouth daily for progressive metastatic disease after ruling out the option of repeat surgery with Dr. Cyndy [name] and reradiation with Dr. Ledon [name].  After a week or so the patient was increased to a dose of 18mg  po daily. She notes no diarrhea or other gastrointestinal symptoms. No skin rashes.  Is noted to have oral thrush and was started on nystatin swish and swallow.   Patient has a history of pulmonary embolism 2015 and has  continued to be on Xarelto for anticoagulation.   INTERVAL HISTORY  Pt returns today for a follow up  and to discuss her PET/CT results. She has been going to physical therapy and has started using a walker. She endorses night sweats and episodes of emesis this morning. Pt thinks a family member might be sick. She hasn't had diarrhea related to her medication, but has had a few episodes the last couple of days. Sarah [name] hasn't noted any new focal symptoms and she denies any new back pain, SOB, or chills at this time. No new weakness in her lower extremities. No issues with mucositis. We discussed the PET results in details concerning for possible disease progression and discussed increasing her dose lenvatinib which the patient was agreeable to.   MEDICAL HISTORY:  Past Medical History:  Diagnosis Date  . [facility])    FNA of thyroid positive for onconytic/hurthle cell carcinoma  . Chronic back pain   . DDD (degenerative disc disease), cervical   . DJD (degenerative joint disease)   . History of rectal fissure   . HIT (heparin-induced thrombocytopenia) ([place])   . Hypertension   . Obesity   . 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 ([place])   . Pulmonary embolus, right ([place]) 2015  . Rotator cuff tendonitis right    SURGICAL HISTORY: Past Surgical History:  Procedure Laterality Date  . LAMINECTOMY N/A 12/14/2013   Procedure: THORACIC LAMINECTOMY FOR TUMOR THORACIC THREE;  Surgeon: Sarah Pall, MD;  Location: [place] NEURO ORS;  Service: Neurosurgery;  Laterality: N/A;  THORACIC LAMINECTOMY FOR TUMOR THORACIC THREE  . LAMINECTOMY N/A 07/05/2015   Procedure: THORACIC LAMINECTOMY FOR TUMOR;  Surgeon: Sarah Pall, MD;  Location: [place] NEURO ORS;  Service: Neurosurgery;  Laterality: N/A;  . POSTERIOR LUMBAR FUSION 4 LEVEL N/A 12/30/2013   Procedure: Thoracic one-Thoracic five posterior thoracic fusion with pedicle screws;  Surgeon: Sarah Pall, MD;  Location: [place] NEURO ORS;  Service: Neurosurgery;  Laterality: N/A;  Thoracic one-Thoracic five posterior thoracic fusion with pedicle screws  . THYROIDECTOMY N/A 01/09/2014    Procedure: TOTAL THYROIDECTOMY;  Surgeon: Sarah Gala, MD;  Location: [place];  Service: ENT;  Laterality: N/A;  . TONSILLECTOMY      SOCIAL HISTORY: Social History   Social History  . Marital status: Single    Spouse name: N/A  . Number of children: N/A  . Years of education: N/A   Occupational History  . Not on file.   Social History Main Topics  . Smoking status: Never Smoker  . Smokeless tobacco: Never Used  . Alcohol use No  . Drug use: No  . Sexual activity: Not Currently   Other Topics Concern  . Not on file   Social History Narrative  . No narrative on file    FAMILY HISTORY: Family History  Problem Relation Age of Onset  . Hypertension Mother   . Diabetes Mother   . Hypertension Father   . Stroke Father   . Diabetes Father   . Diabetes Sister   . Diabetes Sister     ALLERGIES:  is allergic to bee venom; keflex [cephalexin]; tramadol; gabapentin; heparin; hydrochlorothiazide; hydrocodone; and oxycodone-acetaminophen.  MEDICATIONS:  Current Outpatient Prescriptions  Medication Sig Dispense Refill  . baclofen (LIORESAL) 20 MG tablet TAKE (1) TABLET THREE TIMES DAILY. 90 tablet 2  . colchicine 0.6 MG tablet TAKE  (1)  TABLET TWICE A DAY AS NEEDED. 30 tablet 3  . furosemide (LASIX) 20 MG tablet Take 1 tablet (20 mg total)  by mouth daily as needed. 30 tablet 1  . lenvatinib 18 mg daily dose (LENVIMA) 10 & 4 (2) MG capsule Take 18 mg by mouth daily. 90 capsule 2  . levothyroxine (SYNTHROID, LEVOTHROID) 200 MCG tablet Take 1 tablet (200 mcg total) by mouth daily before breakfast. 90 tablet 3  . lisinopril (PRINIVIL,ZESTRIL) 10 MG tablet Take 1 tablet (10 mg total) by mouth daily. 90 tablet 3  . mupirocin ointment (BACTROBAN) 2 % Place 1 application into the nose 2 (two) times daily. And on sores on extremities and face 22 g 0  . ondansetron (ZOFRAN) 4 MG tablet Take 1 tablet (4 mg total) by mouth every 6 (six) hours. 30 tablet 4  . pantoprazole (PROTONIX) 40 MG  tablet Take 1 tablet (40 mg total) by mouth daily. 30 tablet 4  . senna (SENOKOT) 8.6 MG TABS tablet Take 2 tablets (17.2 mg total) by mouth daily. 120 each 0  . sertraline (ZOLOFT) 25 MG tablet TAKE 1 TABLET ONCE A DAY 30 tablet 5  . XARELTO 20 MG TABS tablet Take 1 tablet (20 mg total) by mouth daily. 30 tablet 3   No current facility-administered medications for this visit.     REVIEW OF SYSTEMS:    10 Point review of Systems was done is negative except as noted above.  PHYSICAL EXAMINATION:  ECOG PERFORMANCE STATUS: 3 - Symptomatic, >50% confined to bed  . Vitals:   12/12/16 1037  BP: (!) 173/114  Pulse: 93  Resp: 18  Temp: 97.7 F (36.5 C)  SpO2: 99%   Filed Weights   .There is no height or weight on file to calculate BMI.  GENERAL:alert, in no acute distress and comfortable, sitting in the wheelchair  SKIN: skin color, texture, turgor are normal, no rashes or significant lesions EYES: normal, conjunctiva are pink and non-injected, sclera clear OROPHARYNX: grade 1-2 oral mucositis no overt thrush noted NECK: supple, no JVD, thyroid normal size, non-tender, without nodularity LYMPH:  no palpable lymphadenopathy in the cervical, axillary or inguinal LUNGS: clear to auscultation with normal respiratory effort HEART: regular rate & rhythm,  no murmurs, (+) mild lower extremity edema ABDOMEN: abdomen soft, non-tender, normoactive bowel sounds  Musculoskeletal: no cyanosis of digits and no clubbing , bilateral chronic 1-2+ pitting pedal edema PSYCH: alert & oriented x 3 with fluent speech NEURO: Decreased bilateral lower extremity strength    LABORATORY DATA:  I have reviewed the data as listed  . CBC Latest Ref Rng & Units 12/12/2016 09/01/2016 07/26/2016  WBC 3.9 - 10.3 10e3/uL 3.3(L) 3.5(L) 3.8(L)  Hemoglobin 11.6 - 15.9 g/dL 12.8 10.8(L) 11.9(L)  Hematocrit 34.8 - 46.6 % 38.1 33.5(L) 36.3  Platelets 145 - 400 10e3/uL 153 150 174   ANC 2.3k . CMP Latest Ref Rng &  Units 12/12/2016 09/01/2016 07/26/2016  Glucose 70 - 140 mg/dl 93 109 127(H)  BUN 7.0 - 26.0 mg/dL 10.8 13.3 8  Creatinine 0.6 - 1.1 mg/dL 0.9 1.1 1.30(H)  Sodium 136 - 145 mEq/L 144 143 139  Potassium 3.5 - 5.1 mEq/L 3.6 4.1 3.5  Chloride 101 - 111 mmol/L - - 103  CO2 22 - 29 mEq/L 26 30(H) 29  Calcium 8.4 - 10.4 mg/dL 9.2 9.0 8.3(L)  Total Protein 6.4 - 8.3 g/dL 6.6 5.8(L) 5.7(L)  Total Bilirubin 0.20 - 1.20 mg/dL 1.35(H) 0.92 0.9  Alkaline Phos 40 - 150 U/L 132 127 181(H)  AST 5 - 34 U/L 32 21 42(H)  ALT 0 - 55 U/L  24 17 35         RADIOGRAPHIC STUDIES: I have personally reviewed the radiological images as listed and agreed with the findings in the report.  .Nm Pet Image Restag (ps) Skull Base To Thigh  Result Date: 12/11/2016 CLINICAL DATA:  Subsequent treatment strategy for metastatic Hurthle cell carcinoma of thyroid. EXAM: NUCLEAR MEDICINE PET SKULL BASE TO THIGH TECHNIQUE: 12.6 mCi F-18 FDG was injected intravenously. Full-ring PET imaging was performed from the skull base to thigh after the radiotracer. CT data was obtained and used for attenuation correction and anatomic localization. FASTING BLOOD GLUCOSE:  Value: 96 mg/dl COMPARISON:  None. FINDINGS: NECK Two left level 4 nodes beneath the inferior aspect the sternocleidomastoid muscle are intensely hypermetabolic with SUV max equals 69 increased from SUV max of 22. Larger node measures 16 mm short axis (image 36, series 4) compared to 12 mm. CHEST Hypermetabolic RIGHT paratracheal lymph node is similar to prior. There bilateral small pulmonary nodules which are intensely hypermetabolic for size. These are not increased in size and measure approximately 5 mm each but increased in metabolic activity. For example LEFT upper lobe nodule measuring 5 mm (image 40, series 4) with SUV max equal 5.4 increase from SUV max equal 3.4. Similar nodule in the LEFT upper lobe on image 50, series 4. Nodule in the LEFT lower lobe measures 5 mm on  image 17 of series 4 with SUV max equal 4.1 increased from 2.7. ABDOMEN/PELVIS No abnormal hypermetabolic activity within the liver, pancreas, adrenal glands, or spleen. No hypermetabolic lymph nodes in the abdomen or pelvis. SKELETON Intense metabolic activity at the upper thoracic spine lesions increased from prior with SUV max equal 70 increased from 33.9. This is site of prior radiation and surgical intervention. Lesion within the RIGHT aspect of the manubrium is also increase metabolic activity SUV max equal 33 decreased from 6.2 As reference the metabolic activity within liver and similar between the two scans. : 1. Concern for progression of metastatic Hurthle cell thyroid carcinoma with significant increase in metabolic activity of several lymph nodes, pulmonary nodules, and skeletal metastasis. 2. Most convincing element of progression is a significant increase in metabolic activity (more than doubling). Mild increase in size of the lymph nodes. Electronically Signed   By: Suzy Bouchard M.D.   On: 12/11/2016 15:48     ASSESSMENT & PLAN:  51 year old female with   1) stage IVC metastatic Hurthle cell thyroid carcinoma with metastases to the spine, lung, superior mediastinal mass and nodal metastases. Improved based on last PET/CT  Patient has had treatment as noted above. Recently had cord compression at T3 level due to recurrent tumor status post partial resection and debulking. Recent MRI on 08/14/2015 showed improved but persistent cord compression at the T3 level. She has been noted not to be a candidate for further repeat surgery at this time or repeat radiation. Recent PET/CT scan is noted of bowel done at [facility] on 08/07/2015 showed evidence of tumor progression and patient was started on lenvatinib as per Dr. Leamon [name]. Her thyroglobulin level was 11.7 with anti-body of 9.8 on 08/07/2015 at [place]  PET/CT 11/01/2015: shows relatively stable though high burden of disease. Some concern of local  recurrence in the thyroidectomy bed which is asymptomatic at this time. MRI thoracic spine 12/25/2015: No new abnormality since the prior examination. Status post T1-5 fusion for T3 decompression. Mass effect on the cord at the T3 level appears improved compared to the prior exam. Postoperative fluid collection likely  representing a seroma seen on the prior study has slightly decreased in size.  PET/CT 06/03/2016 with Generally reduced hypermetabolic activity in the previously identified lesions, including the pulmonary nodules, dominant upper thoracic metastatic focus, and left lower cervical adenopathy. Mildly reduced size of previous lesions as well. No new lesions.  PET/CT 12/12/2016: Concern for progression of metastatic Hurthle cell thyroid carcinoma with significant increase in metabolic activity of several lymph nodes, pulmonary nodules, and skeletal metastasis. Most convincing element of progression is a significant increase in metabolic activity (more than doubling). Mild increase in size of the lymph nodes.  2) grade 1 oral mucositis related to Lenvatinib -resolved  3) FDG avidity in the rectum - much improved. No significant concerns on last PET  4) abnormal LFTs with borderline elevation of transaminases and increase in alkaline phosphatase. Mild transaminitis could be from her TKI. PET/ CT did not show any liver involvement or biliary obstruction. Elevation in alkaline phosphatase could be from additional bone involvement. Resolved on labs today Repeat labs today shows stable LFTs  5) Mild Anemia  -- likely from TKI -- now resolved Plan  - patient has no clinical evidence of Hurthle cell carcinoma progression at this time but PET/CT unfortunately does show evidence of disease progression with significant increase in metabolic activity of several lymph nodes, pulmonary nodules, and skeletal metastasis.despite well suppressed TSH levels. Thyroglobulin levels stable with neg ab    -Continue physical therapy- patient has been improving with PT -we discussed and decided to increase Lenvatinib to 22mg  po daily due to concerns of progression and if tolerated will refill future prescriptions at 24mg  po daily. --Her TSH appears well suppressed- she will be following with Dr. Chalmers Cater from endocrinology for this.  6) history of right-sided pulmonary embolism on chronic anticoagulation with Rivaroxaban. Monitor for bleeding. No bleeding reported  7)  HTN. -continue f/u with PCP   RTC with [name] in 3 weeks with labs   All of the patients questions were answered with apparent satisfaction. The patient knows to call the clinic with any problems, questions or concerns.  I spent 20 minutes counseling the patient face to face. The total time spent in the appointment was 25 minutes and more than 50% was on counseling and direct patient cares.    Sullivan Lone MD [place] AAHIVMS [facility] Hematology/Oncology Physician [place]  (Office):       [phone] (Work cell):  [phone] (Fax):           [phone]  This document serves as a record of services personally performed by Sullivan Lone, MD. It was created on her behalf by Margit Banda, a trained medical scribe. The creation of this record is based on the scribe's personal observations and the provider's statements to them. This document has been checked and approved by the attending provider.

## 2016-12-12 ENCOUNTER — Encounter: Payer: Self-pay | Admitting: Hematology

## 2016-12-12 ENCOUNTER — Other Ambulatory Visit (HOSPITAL_BASED_OUTPATIENT_CLINIC_OR_DEPARTMENT_OTHER): Payer: Medicare Other

## 2016-12-12 ENCOUNTER — Ambulatory Visit (HOSPITAL_BASED_OUTPATIENT_CLINIC_OR_DEPARTMENT_OTHER): Payer: Medicare Other | Admitting: Hematology

## 2016-12-12 ENCOUNTER — Telehealth: Payer: Self-pay | Admitting: Hematology

## 2016-12-12 VITALS — BP 173/114 | HR 93 | Temp 97.7°F | Resp 18 | Ht 71.0 in

## 2016-12-12 DIAGNOSIS — C73 Malignant neoplasm of thyroid gland: Secondary | ICD-10-CM | POA: Diagnosis not present

## 2016-12-12 DIAGNOSIS — C779 Secondary and unspecified malignant neoplasm of lymph node, unspecified: Secondary | ICD-10-CM

## 2016-12-12 DIAGNOSIS — Z7901 Long term (current) use of anticoagulants: Secondary | ICD-10-CM | POA: Diagnosis not present

## 2016-12-12 DIAGNOSIS — D34 Benign neoplasm of thyroid gland: Secondary | ICD-10-CM

## 2016-12-12 DIAGNOSIS — C7800 Secondary malignant neoplasm of unspecified lung: Secondary | ICD-10-CM

## 2016-12-12 DIAGNOSIS — Z86718 Personal history of other venous thrombosis and embolism: Secondary | ICD-10-CM | POA: Diagnosis not present

## 2016-12-12 DIAGNOSIS — C7951 Secondary malignant neoplasm of bone: Secondary | ICD-10-CM | POA: Diagnosis not present

## 2016-12-12 DIAGNOSIS — Z79899 Other long term (current) drug therapy: Secondary | ICD-10-CM

## 2016-12-12 LAB — CBC & DIFF AND RETIC
BASO%: 0 % (ref 0.0–2.0)
BASOS ABS: 0 10*3/uL (ref 0.0–0.1)
EOS%: 1.2 % (ref 0.0–7.0)
Eosinophils Absolute: 0 10*3/uL (ref 0.0–0.5)
HEMATOCRIT: 38.1 % (ref 34.8–46.6)
HGB: 12.8 g/dL (ref 11.6–15.9)
Immature Retic Fract: 12.1 % — ABNORMAL HIGH (ref 1.60–10.00)
LYMPH%: 23.6 % (ref 14.0–49.7)
MCH: 29.6 pg (ref 25.1–34.0)
MCHC: 33.6 g/dL (ref 31.5–36.0)
MCV: 88 fL (ref 79.5–101.0)
MONO#: 0.2 10*3/uL (ref 0.1–0.9)
MONO%: 6.3 % (ref 0.0–14.0)
NEUT#: 2.3 10*3/uL (ref 1.5–6.5)
NEUT%: 68.9 % (ref 38.4–76.8)
Platelets: 153 10*3/uL (ref 145–400)
RBC: 4.33 10*6/uL (ref 3.70–5.45)
RDW: 14.2 % (ref 11.2–14.5)
RETIC %: 1.19 % (ref 0.70–2.10)
Retic Ct Abs: 51.53 10*3/uL (ref 33.70–90.70)
WBC: 3.3 10*3/uL — ABNORMAL LOW (ref 3.9–10.3)
lymph#: 0.8 10*3/uL — ABNORMAL LOW (ref 0.9–3.3)

## 2016-12-12 LAB — COMPREHENSIVE METABOLIC PANEL
ALT: 24 U/L (ref 0–55)
ANION GAP: 11 meq/L (ref 3–11)
AST: 32 U/L (ref 5–34)
Albumin: 3.4 g/dL — ABNORMAL LOW (ref 3.5–5.0)
Alkaline Phosphatase: 132 U/L (ref 40–150)
BUN: 10.8 mg/dL (ref 7.0–26.0)
CALCIUM: 9.2 mg/dL (ref 8.4–10.4)
CHLORIDE: 107 meq/L (ref 98–109)
CO2: 26 mEq/L (ref 22–29)
Creatinine: 0.9 mg/dL (ref 0.6–1.1)
EGFR: 72 mL/min/{1.73_m2} — AB (ref >90)
Glucose: 93 mg/dl (ref 70–140)
POTASSIUM: 3.6 meq/L (ref 3.5–5.1)
Sodium: 144 mEq/L (ref 136–145)
Total Bilirubin: 1.35 mg/dL — ABNORMAL HIGH (ref 0.20–1.20)
Total Protein: 6.6 g/dL (ref 6.4–8.3)

## 2016-12-12 LAB — TSH

## 2016-12-12 NOTE — Telephone Encounter (Signed)
Gave avs and calendar for October  °

## 2016-12-12 NOTE — Patient Instructions (Signed)
Thank you for choosing [facility] to provide your oncology and hematology care.  To afford each patient quality time with our providers, please arrive 30 minutes before your scheduled appointment time.  If you arrive late for your appointment, you may be asked to reschedule.  We strive to give you quality time with our providers, and arriving late affects you and other patients whose appointments are after yours.   If you are a no show for multiple scheduled visits, you may be dismissed from the clinic at the providers discretion.    Again, thank you for choosing [facility], our hope is that these requests will decrease the amount of time that you wait before being seen by our physicians.  ______________________________________________________________________  Should you have questions after your visit to the [facility], please contact our office at [phone] between the hours of 8:30 and 4:30 p.m.    Voicemails left after 4:30p.m will not be returned until the following business day.    For prescription refill requests, please have your pharmacy contact us directly.  Please also try to allow 48 hours for prescription requests.    Please contact the scheduling department for questions regarding scheduling.  For scheduling of procedures such as PET scans, CT scans, MRI, Ultrasound, etc please contact central scheduling at [phone].    Resources For Cancer Patients and Caregivers:   Oncolink.org:  A wonderful resource for patients and healthcare providers for information regarding your disease, ways to tract your treatment, what to expect, etc.     American Cancer Society:  800-227-2345  Can help patients locate various types of support and financial assistance  Cancer Care: 1-800-813-HOPE (4673) Provides financial assistance, online support groups, medication/co-pay assistance.    [place] DSS:  336-641-3447 Where to apply for food  stamps, Medicaid, and utility assistance  Medicare Rights Center: 800-333-4114 Helps people with Medicare understand their rights and benefits, navigate the Medicare system, and secure the quality healthcare they deserve  SCAT: 336-333-6589 [employer] [employer]'s shared-ride transportation service for eligible riders who have a disability that prevents them from riding the fixed route bus.    For additional information on assistance programs please contact our social worker:   Grier Hock/Abigail Elmore:  [phone]            

## 2016-12-13 LAB — THYROGLOBULIN ANTIBODY: Thyroglobulin Antibody: <1 IU/mL (ref 0.0–0.9)

## 2016-12-15 DIAGNOSIS — E890 Postprocedural hypothyroidism: Secondary | ICD-10-CM | POA: Diagnosis not present

## 2016-12-15 DIAGNOSIS — C73 Malignant neoplasm of thyroid gland: Secondary | ICD-10-CM | POA: Diagnosis not present

## 2016-12-16 ENCOUNTER — Ambulatory Visit: Payer: Medicare Other | Admitting: Physical Therapy

## 2016-12-16 DIAGNOSIS — R2689 Other abnormalities of gait and mobility: Secondary | ICD-10-CM | POA: Diagnosis not present

## 2016-12-16 DIAGNOSIS — M6281 Muscle weakness (generalized): Secondary | ICD-10-CM

## 2016-12-16 NOTE — Therapy (Signed)
[facility] [address] [place], [place], [address] Phone: [phone]   Fax:  [phone]  Physical Therapy Treatment  Patient Details  Name: Sarah [name] MRN: [id number] Date of Birth: 03-31-1965 Referring Provider: Danella Sensing, NP  Encounter Date: 12/16/2016      PT End of Session - 12/17/16 1413    Visit Number 23  G3   Number of Visits 29   Date for PT Re-Evaluation 12/18/16   Authorization Type [employer] Medicare   Authorization Time Period 11-17-16 - 01-16-17   PT Start Time 1150   PT Stop Time 1235   PT Time Calculation (min) 45 min      Past Medical History:  Diagnosis Date  . 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 Chronic back pain   . DDD (degenerative disc disease), cervical   . DJD (degenerative joint disease)   . History of rectal fissure   . HIT (heparin-induced thrombocytopenia) ([place])   . Hypertension   . Obesity   . 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 ([place])   . Pulmonary embolus, right ([place]) 2015  . Rotator cuff tendonitis right    Past Surgical History:  Procedure Laterality Date  . LAMINECTOMY N/A 12/14/2013   Procedure: THORACIC LAMINECTOMY FOR TUMOR THORACIC THREE;  Surgeon: Ashok Pall, MD;  Location: [place] [facility]URO ORS;  Service: Neurosurgery;  Laterality: N/A;  THORACIC LAMINECTOMY FOR TUMOR THORACIC THREE  . LAMINECTOMY N/A 07/05/2015   Procedure: THORACIC LAMINECTOMY FOR TUMOR;  Surgeon: Ashok Pall, MD;  Location: [place] [facility]UR[facility] ORS;  Service: Neurosurgery;  Laterality: N/A;  . POSTERIOR LUMBAR FUSION 4 LEVEL N/A 12/30/2013   Procedure: Thoracic one-Thoracic five posterior thoracic fusion with pedicle screws;  Surgeon: Ashok Pall, MD;  Location: [place] [facility]UR[facility] ORS;  Service: Neurosurgery;  Laterality: N/A;  Thoracic one-Thoracic five posterior thoracic fusion with pedicle screws  . THYROIDECTOMY N/A 01/09/2014   Procedure: TOTAL THYROIDECTOMY;  Surgeon: Izora Gala,  MD;  Location: [place];  Service: ENT;  Laterality: N/A;  . TONSILLECTOMY      There were no vitals filed for this visit.      Subjective Assessment - 12/17/16 1234    Subjective Pt states she walked 15' at home with assistance and with one person pushing wheelchair behind her   Pertinent History thoracic laminectomy for spinal tumor 12-14-13; 07-05-15 for 2nd sx due to tumor regrowth:  DDD; Rt RTC tendonitis; DDD and DJD:  chronic back pain   Patient Stated Goals get legs stronger and to walk   Currently in Pain? No/denies        Gait training distances as follows to incorporate turns:  Details of gait below   29' with Turn to Rt 20' straight to w/c 6' with turn to left 10' from chair straight ahead to mat with turn to left              [facility] Adult PT Treatment/Exercise - 12/17/16 0001      Transfers   Transfers Sit to Stand;Stand to Sit   Sit to Stand 4: Min assist;3: Mod assist   Sit to Stand Details Verbal cues for sequencing;Verbal cues for technique;Tactile cues for posture   Sit to Stand Details (indicate cue type and reason) mod assist for controlled descent   Stand to Sit 3: Mod assist   Stand Pivot Transfers 3: Mod assist   Stand Pivot Transfer Details (indicate cue type and reason) 1 person following  with wheelchair      Ambulation/Gait   Ambulation/Gait Yes   Ambulation/Gait Assistance 4: Min assist;3: Mod assist   Ambulation/Gait Assistance Details cues to tuck hips and stand erect   Assistive device Rolling walker   Gait Pattern Step-through pattern   Ambulation Surface Level;Indoor                  PT Short Term Goals - 10/08/16 1456      PT SHORT TERM GOAL #6   Title Amb. 20' at home with RW with +1 mod assist.  10-02-16   Time --   Period --   Status Partially Met     PT SHORT TERM GOAL #7   Title Perform sit to stand from wheelchair to RW with +1 min assist.  10-02-16   Baseline 10/08/16: needed varied assistance from min<>mod  assist    Time --   Period --   Status Partially Met           PT Long Term Goals - 11/18/16 2035      PT LONG TERM GOAL #1   Title --   Status --     PT LONG TERM GOAL #2   Title Stand at countertop (or at trampoline at home) modified independently for at least 5" per pt report to demo increased LE strength and improved standing tolerance   Baseline Pt reports standing for approx. 1" at home - 11-17-16   Status On-going   Target Date 12/18/16     PT LONG TERM GOAL #3   Title Pt will perform sit to stand from wheelchair to walker with min assist of 1 person.   Time 4   Period Weeks   Status New   Target Date 12/18/16     PT LONG TERM GOAL #4   Title Negotiate 4" step inside //bars with +2 min assist.   Baseline +2 mod assist needed   Time 4   Status New   Target Date 12/18/16     PT LONG TERM GOAL #5   Title Pt will tolerate rolling prone and maintaining position for at least 10 mins for stretching hip flexors.   09-01-16 TARGET DATE   Status Achieved     PT LONG TERM GOAL #6   Title Pt will amb. 30' at home with RW with min assist +1   Time 4   Period Weeks   Status New   Target Date 12/18/16               Plan - 12/17/16 1420    Clinical Impression Statement Pt much improved with ambulation and with sit to stand transfers, requiring min assist with transfer and mod assist to shift weight anteriorly for upright standing.  Pt continues to need cues for foot placement with turning.   Rehab Potential Poor   Clinical Impairments Affecting Rehab Potential severity of impairments and length of time since onset   PT Frequency 2x / week   PT Duration 4 weeks   PT Treatment/Interventions ADLs/Self Care Home Management;Therapeutic exercise;Balance training;Therapeutic activities;Functional mobility training;Gait training;Neuromuscular re-education;Patient/family education;DME Instruction;Wheelchair mobility training;Passive range of motion   PT Next Visit Plan cont  gait training; sit to/from stand transfers   PT Home Exercise Plan stretches and LE strengthening, continue to work on sit/stand transfers, gait with RW. standing balance activities   Consulted and Agree with Plan of Care Patient      Patient will benefit from skilled therapeutic intervention in order  to improve the following deficits and impairments:  Abnormal gait, Difficulty walking, Decreased balance, Decreased activity tolerance, Impaired tone, Decreased strength, Increased edema  Visit Diagnosis: Other abnormalities of gait and mobility  Muscle weakness (generalized)     Problem List Patient Active Problem List   Diagnosis Date Noted  . Bilateral rotator cuff syndrome 02/12/2016  . E. coli UTI 08/01/2015  . Constipation due to neurogenic bowel 08/01/2015  . Muscle spasticity   . 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 Spastic paraparesis   . Neurogenic bladder   . History of pulmonary embolism   . Post-operative pain   . Metastatic cancer ([place])   . Steroid-induced hyperglycemia   . Benign essential HTN   . Acute blood loss anemia   . Leukocytosis   . Depression   . Obstipation   . Thoracic spine tumor 07/06/2015  . 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 Cord compression ([place])   . Encounter for intubation   . Lower extremity weakness   . Respiratory failure ([place])   . Postoperative hypothyroidism   . Peripheral edema 06/07/2015  . Hypothyroidism, postop 03/08/2015  . Secondary malignant neoplasm of vertebral column ([place]) 03/08/2015  . Spasticity 09/19/2014  . Dysuria 09/04/2014  . Hypertension 05/01/2014  . Ingrown right big toenail 05/01/2014  . Hypokalemia 05/01/2014  . GERD (gastroesophageal reflux disease) 02/14/2014  . Dysphagia, pharyngoesophageal phase 02/06/2014  . Type 2 diabetes mellitus without complication ([place]) 94/17/9199  . Constipation   . Neurogenic bowel   . 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 ([place]) 01/20/2014  . Metastatic cancer to  spine ([place]) 01/19/2014  . Rotator cuff tendonitis   . Acute postoperative respiratory failure ([place]) 01/09/2014  . 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 Leg weakness, bilateral 12/13/2013    PDFGIL, UYYYH [id number], PT 12/17/2016, 2:24 PM  [facility] [address] [place], [place], [address] Phone: [phone]   Fax:  [phone]  Name: SHIANA RAPPLEYE MRN: [id number] Date of Birth: Feb 08, 1966

## 2016-12-17 LAB — THYROGLOBULIN LEVEL: Thyroglobulin (TG-RIA): 32 ng/mL

## 2016-12-18 ENCOUNTER — Ambulatory Visit: Payer: Medicare Other | Admitting: Physical Therapy

## 2016-12-22 ENCOUNTER — Ambulatory Visit: Payer: Medicare Other | Admitting: Physical Therapy

## 2016-12-22 ENCOUNTER — Emergency Department (HOSPITAL_COMMUNITY): Payer: Medicare Other

## 2016-12-22 ENCOUNTER — Encounter (HOSPITAL_COMMUNITY): Payer: Self-pay | Admitting: *Deleted

## 2016-12-22 ENCOUNTER — Emergency Department (HOSPITAL_COMMUNITY)
Admission: EM | Admit: 2016-12-22 | Discharge: 2016-12-22 | Disposition: A | Discharge status: Home / Self Care | Payer: Medicare Other | Attending: Emergency Medicine | Admitting: Emergency Medicine

## 2016-12-22 DIAGNOSIS — C73 Malignant neoplasm of thyroid gland: Secondary | ICD-10-CM | POA: Diagnosis not present

## 2016-12-22 DIAGNOSIS — E119 Type 2 diabetes mellitus without complications: Secondary | ICD-10-CM | POA: Diagnosis not present

## 2016-12-22 DIAGNOSIS — R51 Headache: Secondary | ICD-10-CM | POA: Insufficient documentation

## 2016-12-22 DIAGNOSIS — R519 Headache, unspecified: Secondary | ICD-10-CM

## 2016-12-22 DIAGNOSIS — I10 Essential (primary) hypertension: Secondary | ICD-10-CM | POA: Diagnosis not present

## 2016-12-22 DIAGNOSIS — T50905A Adverse effect of unspecified drugs, medicaments and biological substances, initial encounter: Secondary | ICD-10-CM

## 2016-12-22 DIAGNOSIS — T451X5A Adverse effect of antineoplastic and immunosuppressive drugs, initial encounter: Secondary | ICD-10-CM | POA: Diagnosis not present

## 2016-12-22 DIAGNOSIS — Z79899 Other long term (current) drug therapy: Secondary | ICD-10-CM | POA: Diagnosis not present

## 2016-12-22 DIAGNOSIS — R42 Dizziness and giddiness: Secondary | ICD-10-CM | POA: Diagnosis not present

## 2016-12-22 DIAGNOSIS — R9431 Abnormal electrocardiogram [ECG] [EKG]: Secondary | ICD-10-CM | POA: Diagnosis not present

## 2016-12-22 LAB — COMPREHENSIVE METABOLIC PANEL
ALBUMIN: 3.5 g/dL (ref 3.5–5.0)
ALK PHOS: 122 U/L (ref 38–126)
ALT: 16 U/L (ref 14–54)
ANION GAP: 7 (ref 5–15)
AST: 22 U/L (ref 15–41)
BUN: 11 mg/dL (ref 6–20)
CALCIUM: 8.5 mg/dL — AB (ref 8.9–10.3)
CO2: 29 mmol/L (ref 22–32)
CREATININE: 1.03 mg/dL — AB (ref 0.44–1.00)
Chloride: 107 mmol/L (ref 101–111)
GFR calc Af Amer: >60 mL/min (ref >60)
GFR calc non Af Amer: >60 mL/min (ref >60)
GLUCOSE: 100 mg/dL — AB (ref 65–99)
Potassium: 4.1 mmol/L (ref 3.5–5.1)
Sodium: 143 mmol/L (ref 135–145)
Total Bilirubin: 1.2 mg/dL (ref 0.3–1.2)
Total Protein: 6.3 g/dL — ABNORMAL LOW (ref 6.5–8.1)

## 2016-12-22 LAB — CBC WITH DIFFERENTIAL/PLATELET
BASOS ABS: 0 10*3/uL (ref 0.0–0.1)
Basophils Relative: 0 %
Eosinophils Absolute: 0.1 10*3/uL (ref 0.0–0.7)
Eosinophils Relative: 2 %
HEMATOCRIT: 34.7 % — AB (ref 36.0–46.0)
Hemoglobin: 11.9 g/dL — ABNORMAL LOW (ref 12.0–15.0)
LYMPHS PCT: 35 %
Lymphs Abs: 1.2 10*3/uL (ref 0.7–4.0)
MCH: 29.2 pg (ref 26.0–34.0)
MCHC: 34.3 g/dL (ref 30.0–36.0)
MCV: 85 fL (ref 78.0–100.0)
MONO ABS: 0.3 10*3/uL (ref 0.1–1.0)
Monocytes Relative: 8 %
NEUTROS ABS: 1.9 10*3/uL (ref 1.7–7.7)
Neutrophils Relative %: 55 %
Platelets: 148 10*3/uL — ABNORMAL LOW (ref 150–400)
RBC: 4.08 MIL/uL (ref 3.87–5.11)
RDW: 14.1 % (ref 11.5–15.5)
WBC: 3.4 10*3/uL — ABNORMAL LOW (ref 4.0–10.5)

## 2016-12-22 LAB — I-STAT TROPONIN, ED: Troponin i, poc: 0 ng/mL (ref 0.00–0.08)

## 2016-12-22 MED ORDER — DIPHENHYDRAMINE HCL 50 MG/ML IJ SOLN
12.5000 mg | Freq: Once | INTRAMUSCULAR | Status: AC
  Administered 2016-12-22: 12.5 mg via INTRAVENOUS
  Filled 2016-12-22: qty 1

## 2016-12-22 MED ORDER — LISINOPRIL 20 MG PO TABS: 20.0000 mg | ORAL_TABLET | Freq: Every day | ORAL | 0 refills | Status: DC

## 2016-12-22 MED ORDER — LISINOPRIL 10 MG PO TABS
10.0000 mg | ORAL_TABLET | Freq: Once | ORAL | Status: AC
  Administered 2016-12-22: 10 mg via ORAL
  Filled 2016-12-22: qty 1

## 2016-12-22 MED ORDER — METOCLOPRAMIDE HCL 5 MG/ML IJ SOLN
10.0000 mg | Freq: Once | INTRAMUSCULAR | Status: AC
  Administered 2016-12-22: 10 mg via INTRAVENOUS
  Filled 2016-12-22: qty 2

## 2016-12-22 MED ORDER — IOPAMIDOL (ISOVUE-370) INJECTION 76%
INTRAVENOUS | Status: AC
  Administered 2016-12-22: 75 mL via INTRAVENOUS
  Filled 2016-12-22: qty 100

## 2016-12-22 MED ORDER — LABETALOL HCL 5 MG/ML IV SOLN
10.0000 mg | Freq: Once | INTRAVENOUS | Status: AC
  Administered 2016-12-22: 10 mg via INTRAVENOUS
  Filled 2016-12-22: qty 4

## 2016-12-22 MED ORDER — IOPAMIDOL (ISOVUE-370) INJECTION 76%
100.0000 mL | Freq: Once | INTRAVENOUS | Status: AC | PRN
  Administered 2016-12-22: 75 mL via INTRAVENOUS

## 2016-12-22 MED ORDER — MORPHINE SULFATE (PF) 4 MG/ML IV SOLN
4.0000 mg | Freq: Once | INTRAVENOUS | Status: AC
  Administered 2016-12-22: 4 mg via INTRAVENOUS
  Filled 2016-12-22: qty 1

## 2016-12-22 MED ORDER — HYDRALAZINE HCL 20 MG/ML IJ SOLN
5.0000 mg | Freq: Once | INTRAMUSCULAR | Status: AC
  Administered 2016-12-22: 5 mg via INTRAVENOUS
  Filled 2016-12-22: qty 1

## 2016-12-22 MED ORDER — PROMETHAZINE HCL 25 MG PO TABS: 25.0000 mg | ORAL_TABLET | Freq: Four times a day (QID) | ORAL | 0 refills | Status: DC | PRN

## 2016-12-22 MED ORDER — HYDRALAZINE HCL 20 MG/ML IJ SOLN: 5.0000 mg | Freq: Once | INTRAMUSCULAR | Status: DC

## 2016-12-22 MED ORDER — PROMETHAZINE HCL 25 MG/ML IJ SOLN
25.0000 mg | Freq: Once | INTRAMUSCULAR | Status: AC
  Administered 2016-12-22: 25 mg via INTRAVENOUS
  Filled 2016-12-22: qty 1

## 2016-12-22 NOTE — ED Notes (Signed)
Patient transported to CT 

## 2016-12-22 NOTE — ED Triage Notes (Signed)
Per EMS pt from home with c/o headache, dizziness, and hypertension since 2am today. Pt does reports starting new chemo drug a week ago and was told those will be the side effects.

## 2016-12-22 NOTE — ED Notes (Signed)
Bed: EF00 Expected date:  Expected time:  Means of arrival:  Comments: Hypertension, chemo pt

## 2016-12-22 NOTE — Discharge Instructions (Signed)
Increase lisinopril to 20 mg daily.   Take phenergan as needed for nausea or headache.  Decrease your chemo dose to 18 mg for now.   Please call your oncologist regarding your oral chemo dosing.   See your oncologist in a week   Recheck blood pressure in a week with your doctor  Return to ER if you have severe headaches, vomiting, chest pain, trouble breathing, weakness, numbness

## 2016-12-22 NOTE — ED Notes (Signed)
Delay on EKG pt gone to XRAY

## 2016-12-22 NOTE — ED Provider Notes (Signed)
[facility] DEPT Provider Note   CSN: [id number] Arrival date & time: 12/22/16  0913     History   Chief Complaint Chief Complaint  Patient presents with  . Headache  . Hypertension    HPI Sarah [name] is a 51 y.o. female history of her hurthle cell thyroid cancer, HTN, spinal metastases with paraplegia, who presenting with headache, dizziness, hypertension. [name] currently on oral chemo (lenvatinib) for her thyroid cancer. Patient states that about a week ago, she had a PET scan that showed increased activity of the cancer so the dose was increased about a week ago. She states that since then she has been feeling unwell her blood pressure has been more more elevated. She has been taking lisinopril 10 mg daily as prescribed. Around 2 AM she woke up with severe headache and dizziness. She noticed her blood pressure was around 200/100 and took her dose of lisinopril 10 mg. She denies any chest pain or shortness of breath or abdominal pain. She denies any fevers or chills. She was told this is a side effect of her chemotherapy.   The history is provided by the patient.    Past Medical History:  Diagnosis Date  . 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 Chronic back pain   . DDD (degenerative disc disease), cervical   . DJD (degenerative joint disease)   . History of rectal fissure   . HIT (heparin-induced thrombocytopenia) ([place])   . Hypertension   . Obesity   . 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 Pulmonary embolus, right ([place]) 2015  . Rotator cuff tendonitis right    Patient Active Problem List   Diagnosis Date Noted  . Bilateral rotator cuff syndrome 02/12/2016  . E. coli UTI 08/01/2015  . Constipation due to neurogenic bowel 08/01/2015  . Muscle spasticity   . 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 Spastic paraparesis   . Neurogenic bladder   . History of pulmonary embolism   . Post-operative pain   . Metastatic cancer ([place])     . Steroid-induced hyperglycemia   . Benign essential HTN   . Acute blood loss anemia   . Leukocytosis   . Depression   . Obstipation   . Thoracic spine tumor 07/06/2015  . 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 Cord compression ([place])   . Encounter for intubation   . Lower extremity weakness   . Respiratory failure ([place])   . Postoperative hypothyroidism   . Peripheral edema 06/07/2015  . Hypothyroidism, postop 03/08/2015  . Secondary malignant neoplasm of vertebral column ([place]) 03/08/2015  . Spasticity 09/19/2014  . Dysuria 09/04/2014  . Hypertension 05/01/2014  . Ingrown right big toenail 05/01/2014  . Hypokalemia 05/01/2014  . GERD (gastroesophageal reflux disease) 02/14/2014  . Dysphagia, pharyngoesophageal phase 02/06/2014  . Type 2 diabetes mellitus without complication ([place]) 02/40/9735  . Constipation   . Neurogenic bowel   . 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 Metastatic cancer to spine ([place]) 01/19/2014  . Rotator cuff tendonitis   . Acute postoperative respiratory failure ([place]) 01/09/2014  . 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 Leg weakness, bilateral 12/13/2013    Past Surgical History:  Procedure Laterality Date  . LAMINECTOMY N/A 12/14/2013   Procedure: THORACIC LAMINECTOMY FOR TUMOR THORACIC THREE;  Surgeon: Ashok Pall, MD;  Location: [place] NEUR[facility] ORS;  Service: Neurosurgery;  Laterality: N/A;  THORACIC LAMINECTOMY FOR TUMOR THORACIC THREE  . LAMINECTOMY N/A 07/05/2015   Procedure: THORACIC LAMINECTOMY FOR TUMOR;  Surgeon: Ashok Pall, MD;  Location: [place] NEUR[facility] ORS;  Service: Neurosurgery;  Laterality: N/A;  . POSTERIOR LUMBAR FUSION 4 LEVEL N/A 12/30/2013   Procedure: Thoracic one-Thoracic five posterior thoracic fusion with pedicle screws;  Surgeon: Ashok Pall, MD;  Location: [place] [facility]UR[facility];  Service: Neurosurgery;  Laterality: N/A;  Thoracic one-Thoracic five posterior  thoracic fusion with pedicle screws  . THYROIDECTOMY N/A 01/09/2014   Procedure: TOTAL THYROIDECTOMY;  Surgeon: Izora Gala, MD;  Location: [facility];  Service: ENT;  Laterality: N/A;  . TONSILLECTOMY      OB History    No data available       Home Medications    Prior to Admission medications   Medication Sig Start Date End Date Taking? Authorizing Provider  baclofen (LIORESAL) 20 MG tablet TAKE (1) TABLET THREE TIMES DAILY. 11/12/16  Yes [name], MD  colchicine 0.6 MG tablet TAKE  (1)  TABLET TWICE A DAY AS NEEDED. Patient taking differently: Take one tablet by mouth as needed for gout. 12/02/16  Yes [name], MD  furosemide (LASIX) 20 MG tablet Take 1 tablet (20 mg total) by mouth daily as needed. Patient taking differently: Take 20 mg by mouth daily as needed for fluid.  09/05/16  Yes Brunetta Genera, MD  lenvatinib 18 mg daily dose (LENVIMA) 10 & 4 (2) MG capsule Take 18 mg by mouth daily. Patient taking differently: Take 22 mg by mouth daily.  12/03/16  Yes Brunetta Genera, MD  levothyroxine (SYNTHROID, LEVOTHROID) 175 MCG tablet Take 175 mcg by mouth daily before breakfast.   Yes [provider]  lisinopril (PRINIVIL,ZESTRIL) 10 MG tablet Take 1 tablet (10 mg total) by mouth daily. 08/29/16  Yes McKeag, Marylynn Pearson, MD  mupirocin ointment (BACTROBAN) 2 % Place 1 application into the nose 2 (two) times daily. And on sores on extremities and face 01/31/16  Yes Brunetta Genera, MD  ondansetron (ZOFRAN) 4 MG tablet Take 1 tablet (4 mg total) by mouth every 6 (six) hours. 08/12/16  Yes [name], MD  pantoprazole (PROTONIX) 40 MG tablet Take 1 tablet (40 mg total) by mouth daily. 08/12/16  Yes [name], MD  senna (SENOKOT) 8.6 MG TABS tablet Take 2 tablets (17.2 mg total) by mouth daily. 05/13/16  Yes Bayard Hugger, NP  sertraline (ZOLOFT) 25 MG tablet TAKE 1 TABLET ONCE A DAY 03/04/16  Yes McKeag, Marylynn Pearson, MD  XARELTO 20 MG TABS tablet Take 1  tablet (20 mg total) by mouth daily. 12/02/16  Yes [name], MD  levothyroxine (SYNTHROID, LEVOTHROID) 200 MCG tablet Take 1 tablet (200 mcg total) by mouth daily before breakfast. Patient not taking: Reported on 12/22/2016 04/09/16   McKeag, Marylynn Pearson, MD    Family History Family History  Problem Relation Age of Onset  . Hypertension Mother   . Diabetes Mother   . Hypertension Father   . Stroke Father   . Diabetes Father   . Diabetes Sister   . Diabetes Sister     Social History Social History  Substance Use Topics  . Smoking status: Never Smoker  . Smokeless tobacco: Never Used  . Alcohol use No     Allergies   Bee venom; Keflex [cephalexin]; Tramadol; Gabapentin; Heparin; Hydrochlorothiazide; Hydrocodone; and Oxycodone-acetaminophen   Review of Systems Review of Systems  Neurological: Positive  for headaches.  All other systems reviewed and are negative.    Physical Exam Updated Vital Signs BP (!) 132/92   Pulse 93   Resp (!) 9   LMP 10/22/2016   SpO2 99%   Physical Exam  Constitutional: She is oriented to person, place, and time.  Chronically ill, slightly uncomfortable   HENT:  Head: Normocephalic.  Mouth/Throat: Oropharynx is clear and moist.  Eyes: Pupils are equal, round, and reactive to light. Conjunctivae and EOM are normal.  Neck: Normal range of motion. Neck supple.  No pulsatile mass   Cardiovascular: Normal rate, regular rhythm and normal heart sounds.   Pulmonary/Chest: Effort normal and breath sounds normal. No respiratory distress. She has no wheezes. She has no rales.  Abdominal: Soft. Bowel sounds are normal. She exhibits no distension. There is no tenderness. There is no guarding.  Musculoskeletal: Normal range of motion. She exhibits no edema.  Neurological: She is alert and oriented to person, place, and time.  Strength 3/5 bilateral legs.   Skin: Skin is warm.  Psychiatric: She has a normal mood and affect.  Nursing note and vitals  reviewed.    ED Treatments / Results  Labs (all labs ordered are listed, but only abnormal results are displayed) Labs Reviewed  CBC WITH DIFFERENTIAL/PLATELET - Abnormal; Notable for the following:       Result Value   WBC 3.4 (*)    Hemoglobin 11.9 (*)    HCT 34.7 (*)    Platelets 148 (*)    All other components within normal limits  COMPREHENSIVE METABOLIC PANEL - Abnormal; Notable for the following:    Glucose, Bld 100 (*)    Creatinine, Ser 1.03 (*)    Calcium 8.5 (*)    Total Protein 6.3 (*)    All other components within normal limits  I-STAT TROPONIN, ED    EKG  EKG Interpretation  Date/Time:  Monday December 22 2016 11:02:43 EDT Ventricular Rate:  66 PR Interval:    QRS Duration: 119 QT Interval:  426 QTC Calculation: 447 R Axis:   15 Text Interpretation:  Sinus rhythm Nonspecific intraventricular conduction delay Low voltage, precordial leads No significant change since last tracing Confirmed by Wandra Arthurs [phone]) on 12/22/2016 12:09:07 PM       Radiology Ct Angio Head W Or Wo Contrast  Result Date: 12/22/2016 CLINICAL DATA:  Headache and dizziness. Subarachnoid hemorrhage suspected. EXAM: CT ANGIOGRAPHY HEAD TECHNIQUE: Multidetector CT imaging of the head was performed using the standard protocol during bolus administration of intravenous contrast. Multiplanar CT image reconstructions and MIPs were obtained to evaluate the vascular anatomy. CONTRAST:  75 mL Isovue 370 COMPARISON:  None. FINDINGS: CT HEAD Brain: No mass lesion, intraparenchymal hemorrhage or extra-axial collection. No evidence of acute cortical infarct. There is a peripherally calcified pineal gland cyst, an incidental finding. Brain parenchyma is normal for age. Vascular: No hyperdense vessel or unexpected calcification. Skull: Normal visualized skull base, calvarium and extracranial soft tissues. Sinuses/Orbits: No sinus fluid levels or advanced mucosal thickening. No mastoid effusion. Normal  orbits. CTA HEAD Anterior circulation: --Intracranial internal carotid arteries: Normal. --Anterior cerebral arteries: Normal. --Middle cerebral arteries: Normal. --Posterior communicating arteries: Present on the left. Posterior circulation: --Posterior cerebral arteries: Normal. --Superior cerebellar arteries: Normal. --Basilar artery: Normal. --Anterior inferior cerebellar arteries: Normal. --Posterior inferior cerebellar arteries: Normal. Normal codominant vertebral artery V4 segments. Venous sinuses: As permitted by contrast timing, patent. Anatomic variants: None Delayed phase: No abnormal intracranial enhancement. IMPRESSION: 1. No subarachnoid  hemorrhage or other acute abnormality. 2. Normal intracranial CTA.  No aneurysm, occlusion or stenosis. Electronically Signed   By: Ulyses Jarred M.D.   On: 12/22/2016 13:54   Dg Chest 2 View  Result Date: 12/22/2016 CLINICAL DATA:  51 year old female with metastatic thyroid cancer. Headache, dizziness and hypertension since 2 a.m. Started chemotherapy 1 week ago. Initial encounter. EXAM: CHEST  2 VIEW COMPARISON:  PET CT 12/11/2016. FINDINGS: No infiltrate, congestive heart failure or pneumothorax. CT detected pulmonary nodules less well delineated on present plain film examination. Postsurgical changes upper thoracic spine. Heart size within normal limits. Left acromioclavicular joint degenerative changes. IMPRESSION: No infiltrate or congestive heart failure. CT detected pulmonary nodules less well delineated on present plain film exam. Postsurgical changes upper thoracic spine. Electronically Signed   By: Genia Del M.D.   On: 12/22/2016 10:23    Procedures Procedures (including critical care time)  Medications Ordered in ED Medications  lisinopril (PRINIVIL,ZESTRIL) tablet 10 mg (10 mg Oral Given 12/22/16 1107)  hydrALAZINE (APRESOLINE) injection 5 mg (5 mg Intravenous Given 12/22/16 1109)  metoCLOPramide (REGLAN) injection 10 mg (10 mg Intravenous  Given 12/22/16 1109)  diphenhydrAMINE (BENADRYL) injection 12.5 mg (12.5 mg Intravenous Given 12/22/16 1108)  iopamidol (ISOVUE-370) 76 % injection 100 mL (75 mLs Intravenous Contrast Given 12/22/16 1314)  labetalol (NORMODYNE,TRANDATE) injection 10 mg (10 mg Intravenous Given 12/22/16 1439)  morphine 4 MG/ML injection 4 mg (4 mg Intravenous Given 12/22/16 1437)  promethazine (PHENERGAN) injection 25 mg (25 mg Intravenous Given 12/22/16 1440)     Initial Impression / Assessment and Plan / ED Course  I have reviewed the triage vital signs and the nursing notes.  Pertinent labs & imaging results that were available during my care of the patient were reviewed by me and considered in my medical decision making (see chart for details).     Sarah [name] is a 51 y.o. female here with headache, dizziness, hypertension. Likely side effect of the new chemotherapy drugand her recent PET scan does not show any metastases to the brain. Low suspicion for [facility] but outside of 4 hrs window so will get CTA to r/o SAH or ruptured aneurysm and if CTA negative, will not need LP. Will give migraine cocktail, BP meds.   2:55 PM CTA showed no SAH. Labs unremarkable. BP improved with BP meds, migraine cocktail. Suspect side effect of chemo. Will have her decrease her dose again. Will increase lisinopril to 20 mg daily, given phenergan as needed for nausea or headaches. Will have her call oncology regarding adjusting chemo meds.    Final Clinical Impressions(s) / ED Diagnoses   Final diagnoses:  None    New Prescriptions New Prescriptions   No medications on file     Drenda Freeze, MD 12/22/16 1458

## 2016-12-23 ENCOUNTER — Telehealth: Payer: Self-pay

## 2016-12-23 NOTE — Telephone Encounter (Signed)
Pt called that she increased her lenvima by 4 mg last Monday. 10/1 in ER with high BP 218/115, a real bad headache and had checked her pressure at home. ED increased her lisinopril. She is letting Dr Irene Limbo be aware. She has next appt on 10/12. She is taking her BP every day at home.

## 2016-12-26 ENCOUNTER — Telehealth: Payer: Self-pay | Admitting: *Deleted

## 2016-12-26 NOTE — Telephone Encounter (Signed)
Pt called stating BP is still running high since D/C from hospital earlier this week.  Pt stated BP was running high prior to increase in dose of lenvima.  Pt wondering if [name] needs to see her.  Informed pt that [name] is out of the office today.  Instructed pt to contact PCP since they are managing BP meds.  Pt verbalized understanding/in agreement with plan.

## 2016-12-29 ENCOUNTER — Encounter: Payer: Self-pay | Admitting: Family Medicine

## 2016-12-29 ENCOUNTER — Ambulatory Visit (INDEPENDENT_AMBULATORY_CARE_PROVIDER_SITE_OTHER): Payer: Medicare Other | Admitting: Family Medicine

## 2016-12-29 ENCOUNTER — Ambulatory Visit: Payer: Medicare Other | Admitting: Physical Therapy

## 2016-12-29 VITALS — BP 118/78 | HR 106 | Temp 98.6°F

## 2016-12-29 DIAGNOSIS — I10 Essential (primary) hypertension: Secondary | ICD-10-CM

## 2016-12-29 NOTE — Progress Notes (Signed)
    Subjective:    Patient ID: Sarah [name], female    DOB: 1965-07-25, 51 y.o.   MRN: [id number]   CC: elevated BP readings at home  Patient had several elevated BP readings at home and symptomatic high blood pressure- has been having headaches. BP at home was 200/100. Went to ED. Lisinopril dose increased from 10 to 20mg  by ED provider. Since then patient has felt better, however her home BP cuff still reads high BP >180/90's. She has an automatic upper arm cuff. She reports her chemo medication dose was recently increased and is wondering if high BP is elevated secondary to that. She sees oncology on Friday.   Smoking status reviewed- non-smoker  Review of Systems- no current chest pain, SOB, blurred vision, decreased urination, or headache   Objective:  BP 118/78   Pulse (!) 106   Temp 98.6 F (37 C) (Oral)   LMP 08/22/2016   SpO2 98%  Vitals and nursing note reviewed  General: pleasant lady, wheelchair bound, well nourished, in no acute distress HEENT: normocephalic, no scleral icterus or conjunctival pallor, no nasal discharge, moist mucous membranes Neck: supple, non-tender, without lymphadenopathy Cardiac: RRR, clear S1 and S2, no murmurs, rubs, or gallops Respiratory: clear to auscultation bilaterally, no increased work of breathing Extremities: no edema or cyanosis.  Assessment & Plan:    Hypertension  Manual BP in office today normal at 118/78. Patient's home BP cuff is likely not accurate. Asked her to use a different cuff and/or check BP at pharmacy. Upcoming appointment with PT on Wednesday and oncology on Friday.  -continue lisinopril 20 mg -asked patient to check her BP at upcoming visits, if persistently elevated she is to call us to discuss adding another BP agent. At this time her BP is well controlled and do not see a reason to add another medication -Patient verbalized understanding and agreement with plan    Return as needed.   Lucila Maine,  DO Family Medicine Resident PGY-2

## 2016-12-29 NOTE — Patient Instructions (Signed)
   It was nice to meet you today!  I'm glad you're feeling better. Let's continue with the 20 mg lisinopril for now and see how your blood pressure is on Friday at your oncology appointment. If it is still elevated please call us and we can talk about adding another blood pressure agent, however it seems that the increased Lisinopril has done the job.   If you have questions or concerns please do not hesitate to call at [phone].  Lucila Maine, DO PGY-2, [facility] 12/29/2016 2:00 PM

## 2016-12-29 NOTE — Assessment & Plan Note (Signed)
  Manual BP in office today normal at 118/78. Patient's home BP cuff is likely not accurate. Asked her to use a different cuff and/or check BP at pharmacy. Upcoming appointment with PT on Wednesday and oncology on Friday.  -continue lisinopril 20 mg -asked patient to check her BP at upcoming visits, if persistently elevated she is to call us to discuss adding another BP agent. At this time her BP is well controlled and do not see a reason to add another medication -Patient verbalized understanding and agreement with plan

## 2016-12-31 ENCOUNTER — Ambulatory Visit: Payer: Medicare Other | Admitting: Physical Therapy

## 2017-01-02 ENCOUNTER — Telehealth: Payer: Self-pay | Admitting: Hematology

## 2017-01-02 ENCOUNTER — Ambulatory Visit: Payer: Medicare Other | Admitting: Physical Therapy

## 2017-01-02 ENCOUNTER — Ambulatory Visit (HOSPITAL_BASED_OUTPATIENT_CLINIC_OR_DEPARTMENT_OTHER): Payer: Medicare Other | Admitting: Hematology

## 2017-01-02 ENCOUNTER — Encounter: Payer: Self-pay | Admitting: Hematology

## 2017-01-02 ENCOUNTER — Other Ambulatory Visit (HOSPITAL_BASED_OUTPATIENT_CLINIC_OR_DEPARTMENT_OTHER): Payer: Medicare Other

## 2017-01-02 VITALS — BP 170/107 | HR 82 | Temp 97.9°F | Resp 20 | Ht 71.0 in

## 2017-01-02 DIAGNOSIS — C779 Secondary and unspecified malignant neoplasm of lymph node, unspecified: Secondary | ICD-10-CM | POA: Diagnosis not present

## 2017-01-02 DIAGNOSIS — Z79899 Other long term (current) drug therapy: Secondary | ICD-10-CM | POA: Diagnosis not present

## 2017-01-02 DIAGNOSIS — C7951 Secondary malignant neoplasm of bone: Secondary | ICD-10-CM

## 2017-01-02 DIAGNOSIS — C7800 Secondary malignant neoplasm of unspecified lung: Secondary | ICD-10-CM | POA: Diagnosis not present

## 2017-01-02 DIAGNOSIS — I10 Essential (primary) hypertension: Secondary | ICD-10-CM

## 2017-01-02 DIAGNOSIS — C73 Malignant neoplasm of thyroid gland: Secondary | ICD-10-CM

## 2017-01-02 DIAGNOSIS — Z7189 Other specified counseling: Secondary | ICD-10-CM

## 2017-01-02 LAB — CBC & DIFF AND RETIC
BASO%: 0.3 % (ref 0.0–2.0)
Basophils Absolute: 0 10*3/uL (ref 0.0–0.1)
EOS ABS: 0.1 10*3/uL (ref 0.0–0.5)
EOS%: 2 % (ref 0.0–7.0)
HCT: 37.4 % (ref 34.8–46.6)
HGB: 12.7 g/dL (ref 11.6–15.9)
IMMATURE RETIC FRACT: 6.2 % (ref 1.60–10.00)
LYMPH%: 35.3 % (ref 14.0–49.7)
MCH: 29.5 pg (ref 25.1–34.0)
MCHC: 34 g/dL (ref 31.5–36.0)
MCV: 87 fL (ref 79.5–101.0)
MONO#: 0.2 10*3/uL (ref 0.1–0.9)
MONO%: 7.5 % (ref 0.0–14.0)
NEUT%: 54.9 % (ref 38.4–76.8)
NEUTROS ABS: 1.6 10*3/uL (ref 1.5–6.5)
PLATELETS: 153 10*3/uL (ref 145–400)
RBC: 4.3 10*6/uL (ref 3.70–5.45)
RDW: 14.6 % — ABNORMAL HIGH (ref 11.2–14.5)
RETIC %: 1.42 % (ref 0.70–2.10)
RETIC CT ABS: 61.06 10*3/uL (ref 33.70–90.70)
WBC: 3 10*3/uL — ABNORMAL LOW (ref 3.9–10.3)
lymph#: 1 10*3/uL (ref 0.9–3.3)
nRBC: 0 % (ref 0–0)

## 2017-01-02 LAB — COMPREHENSIVE METABOLIC PANEL
ALT: 12 U/L (ref 0–55)
ANION GAP: 10 meq/L (ref 3–11)
AST: 24 U/L (ref 5–34)
Albumin: 3.2 g/dL — ABNORMAL LOW (ref 3.5–5.0)
Alkaline Phosphatase: 126 U/L (ref 40–150)
BILIRUBIN TOTAL: 1.06 mg/dL (ref 0.20–1.20)
BUN: 11.9 mg/dL (ref 7.0–26.0)
CO2: 25 meq/L (ref 22–29)
CREATININE: 0.9 mg/dL (ref 0.6–1.1)
Calcium: 8.8 mg/dL (ref 8.4–10.4)
Chloride: 108 mEq/L (ref 98–109)
EGFR: >60 mL/min/{1.73_m2} (ref >60)
GLUCOSE: 87 mg/dL (ref 70–140)
Potassium: 4 mEq/L (ref 3.5–5.1)
Sodium: 143 mEq/L (ref 136–145)
TOTAL PROTEIN: 6.3 g/dL — AB (ref 6.4–8.3)

## 2017-01-02 LAB — TSH: TSH: <0.08 m(IU)/L — ABNORMAL LOW (ref 0.308–3.960)

## 2017-01-02 MED ORDER — LENVATINIB (24 MG DAILY DOSE) 2 X 10 MG & 4 MG PO CPPK: 24.0000 mg | ORAL_CAPSULE | Freq: Every day | ORAL | 1 refills | Status: DC

## 2017-01-02 MED ORDER — LISINOPRIL 20 MG PO TABS: 40.0000 mg | ORAL_TABLET | Freq: Every day | ORAL | 1 refills | Status: DC

## 2017-01-02 MED FILL — LENVIMA 24 MG DAILY DOSE: 2 X 10 MG & | 30 days supply | Qty: 90 | Fill #0

## 2017-01-02 NOTE — Telephone Encounter (Signed)
Gave avs and calendar for November  °

## 2017-01-02 NOTE — Progress Notes (Signed)
[name]    HEMATOLOGY/ONCOLOGY CLINIC NOTE  Date of Service: 01/02/17   Patient Care Team: [name], MD as PCP - General (Family Medicine) [name], MD as Consulting Physician (Physical Medicine and Rehabilitation) Ashok Pall, MD as Consulting Physician (Neurosurgery) Brunetta Genera, MD as Consulting Physician (Hematology) Jacelyn Pi, MD as Consulting Physician (Endocrinology)   Dayton Bailiff MD (Spine [facility]) [phone]. 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  01/09/2014- patient underwent total thyroidectomy by Dr. Elie Goody. Pathology showed Hurthle cell carcinoma with no perineural invasion, presence of lymphovascular invasion and extra thyroidal extension. p T3, p N0, Mx  Received 30 grays in 10 fractions to her C7-T6 lesion finished in November 2015  Received 135 mCi of iodine 131 on 07/21/2014. Whole-body scan was apparently negative.  07/05/2015-07/10/2015 admitted to [facility] for leg weakness. MRI of the T-spine showed recurrent tumor causing spinal cord compression. Patient was emergently taken to the or and underwent a redo thoracic laminectomy of T3 and partial tumor resection.   08/07/2015 PET/CT scan of the chest abdomen pelvis showed slight enlargement of FDG avid left level III and level IV lymph nodes concerning for worsening nodal metastases. Slight enlargement of the hypermetabolic right superior mediastinal mass and multiple metabolically active pulmonary nodules representing worsening metastases. T3 vertebral body and bilateral pedicle metastases status  post posture metallic fusion. No residual uptake in the thyroidectomy bed  Thyroglobulin level was 11.7 (thyroglobulin antibody present 9.8)   HISTORY OF PRESENTING ILLNESS:   Sarah [name] is a wonderful 51 y.o. female who has been referred to [place] by Dr Leamon Arnt (medical oncology at [facility]) for help with local comanagement of the patient's metastatic Hurthle cell thyroid carcinoma with details as noted above. Patient had recent hospitalization from 4/13/7 to 07/10/2015 at [facility] for lower extremity weakness and was noted to have cord compression from recurrent tumor at T3. She is status post redo thoracic laminectomy of T3 and partial tumor resection to try to decompress the core. Pathology was consistent with metastatic Hurthle cell carcinoma.  She has been on dexamethasone 2 mg by mouth twice a day. Continue to have lower extremity weakness and is currently in a skilled nursing facility getting rehabilitation. She feels her lower extremity weakness is possibly getting somewhat better.  Patient had a repeat PET/CT scan at [facility] on 08/07/2015 as noted above which shows progression of her metastatic disease including nodal metastases , pulmonary nodules and right superior mediastinal mass.  MRI of the spine on 08/14/2015 showed improved but persistent cord compression at T3 level.  She was started by Dr. Leamon Arnt on Lenvatinib and 14 mg by mouth daily for progressive metastatic disease after ruling out the option of repeat surgery with Dr. Cyndy Freeze and reradiation with Dr. Ledon Snare.  After a week or so the patient was increased to a dose of 18mg  po daily. She notes no diarrhea or other gastrointestinal symptoms. No skin rashes.  Is noted to have oral thrush and was started on nystatin swish and swallow.   Patient has a history of pulmonary embolism 2015 and has  continued to be on Xarelto for anticoagulation.   INTERVAL HISTORY  Pt returns today for a follow up.  We did up her dose of Lenvatinib to 22mg  po daily, she initially tolerated this well; however, she began to develop hypertension. She was seen in the ED for this on 12/22/16 where she was noted to be hypertensive and was given doses of her HTN medication to control this. She subsequently saw her PCP for this and her dosage of Lisinopril was increased to 20mg  daily. She reports that she typically checks her blood pressure in the evenings. Pt states that otherwise she has been tolerating chemotherapy well and her blood counts and chemistries have remained stable. She reports that she has developed a mild rash to her upper extremities, but this seems to be resolving. She also continues to attend physical therapy and she has been progressing well. She has been attending this two days a week, typically.   She denies headache, leg swelling, cough, shortness of breath, fever, chills, abdominal pain, change in bowel/bladder habits, or any other associated symptoms.   MEDICAL HISTORY:  Past Medical History:  Diagnosis Date  . Cancer [facility])    FNA of thyroid positive for onconytic/hurthle cell carcinoma  . Chronic back pain   . DDD (degenerative disc disease), cervical   . DJD (degenerative joint disease)   . History of rectal fissure   . HIT (heparin-induced thrombocytopenia) ([place])   . Hypertension   . Obesity   . 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 Pulmonary embolus, right ([place]) 2015  . Rotator cuff tendonitis right    SURGICAL HISTORY: Past Surgical History:  Procedure Laterality Date  . LAMINECTOMY N/A 12/14/2013   Procedure: THORACIC LAMINECTOMY FOR TUMOR THORACIC THREE;  Surgeon: Ashok Pall, MD;  Location: [place] NEURO ORS;  Service: Neurosurgery;  Laterality: N/A;  THORACIC LAMINECTOMY FOR TUMOR THORACIC THREE  . LAMINECTOMY N/A 07/05/2015   Procedure: THORACIC LAMINECTOMY FOR TUMOR;  Surgeon: Ashok Pall, MD;  Location: [place] NEURO ORS;  Service: Neurosurgery;  Laterality: N/A;  . POSTERIOR LUMBAR  FUSION 4 LEVEL N/A 12/30/2013   Procedure: Thoracic one-Thoracic five posterior thoracic fusion with pedicle screws;  Surgeon: Ashok Pall, MD;  Location: [place] NEURO ORS;  Service: Neurosurgery;  Laterality: N/A;  Thoracic one-Thoracic five posterior thoracic fusion with pedicle screws  . THYROIDECTOMY N/A 01/09/2014   Procedure: TOTAL THYROIDECTOMY;  Surgeon: Izora Gala, MD;  Location: [place];  Service: ENT;  Laterality: N/A;  . TONSILLECTOMY      SOCIAL HISTORY: Social History   Social History  . Marital status: Single    Spouse name: N/A  . Number of children: N/A  . Years of education: N/A   Occupational History  . Not on file.   Social History Main Topics  . Smoking status: Never Smoker  . Smokeless tobacco: Never Used  . Alcohol use No  . Drug use: No  . Sexual activity: Not Currently   Other Topics Concern  . Not on file   Social History Narrative  . No narrative on file    FAMILY HISTORY: Family History  Problem Relation Age of Onset  . Hypertension Mother   . Diabetes Mother   . Hypertension Father   . Stroke Father   . Diabetes Father   . Diabetes Sister   . Diabetes Sister     ALLERGIES:  is allergic to bee venom; keflex [cephalexin]; tramadol; gabapentin; heparin; hydrochlorothiazide; hydrocodone; and oxycodone-acetaminophen.  MEDICATIONS:  Current Outpatient Prescriptions  Medication Sig Dispense Refill  . baclofen (LIORESAL) 20 MG tablet TAKE (1) TABLET THREE TIMES DAILY. 90 tablet 2  . colchicine 0.6 MG tablet TAKE  (1)  TABLET TWICE A DAY AS NEEDED. (Patient taking differently: Take one tablet by mouth as needed for gout.) 30 tablet 3  . furosemide (LASIX) 20 MG tablet Take 1 tablet (20 mg total) by mouth daily as needed. (Patient taking differently: Take 20 mg by mouth daily as needed for fluid. ) 30 tablet 1  . lenvatinib 24 mg daily dose (LENVIMA) 10 (2) & 4 MG capsule Take 24 mg by mouth daily. 90 capsule 1  . levothyroxine (SYNTHROID,  LEVOTHROID) 175 MCG tablet Take 175 mcg by mouth daily before breakfast.    . lisinopril (PRINIVIL,ZESTRIL) 20 MG tablet Take 2 tablets (40 mg total) by mouth daily. 60 tablet 1  . mupirocin ointment (BACTROBAN) 2 % Place 1 application into the nose 2 (two) times daily. And on sores on extremities and face 22 g 0  . ondansetron (ZOFRAN) 4 MG tablet Take 1 tablet (4 mg total) by mouth every 6 (six) hours. 30 tablet 4  . pantoprazole (PROTONIX) 40 MG tablet Take 1 tablet (40 mg total) by mouth daily. 30 tablet 4  . promethazine (PHENERGAN) 25 MG tablet Take 1 tablet (25 mg total) by mouth every 6 (six) hours as needed for nausea or vomiting. 20 tablet 0  . senna (SENOKOT) 8.6 MG TABS tablet Take 2 tablets (17.2 mg total) by mouth daily. 120 each 0  . sertraline (ZOLOFT) 25 MG tablet TAKE 1 TABLET ONCE A DAY 30 tablet 5  . XARELTO 20 MG TABS tablet Take 1 tablet (20 mg total) by mouth daily. 30 tablet 3   No current facility-administered medications for this visit.     REVIEW OF SYSTEMS:    10 Point review of Systems was done is negative except as noted above.  PHYSICAL EXAMINATION:  ECOG PERFORMANCE STATUS: 3 - Symptomatic, >50% confined to bed  . Vitals:   01/02/17 1141  BP: (!) 170/107  Pulse: 82  Resp: 20  Temp: 97.9 F (36.6 C)  SpO2: 100%   There were no vitals filed for this visit. .There is no height or weight on file to calculate BMI.  GENERAL:alert, in no acute distress and comfortable, sitting in the wheelchair  SKIN: skin color, texture, turgor are normal, no rashes or significant lesions EYES: normal, conjunctiva are pink and non-injected, sclera clear OROPHARYNX: grade 1-2 oral mucositis no overt thrush noted NECK: supple, no JVD, thyroid normal size, non-tender, without nodularity LYMPH:  no palpable lymphadenopathy in the cervical, axillary or inguinal LUNGS: clear to auscultation with normal respiratory effort HEART: regular rate & rhythm,  no murmurs, (+) mild  lower extremity edema ABDOMEN: abdomen soft, non-tender, normoactive bowel sounds  Musculoskeletal: no cyanosis of digits and no clubbing , bilateral chronic 1-2+ pitting pedal edema PSYCH: alert & oriented x 3 with fluent speech NEURO: Decreased bilateral lower extremity strength    LABORATORY DATA:  I have reviewed the data as listed  . CBC Latest Ref Rng & Units 01/02/2017 12/22/2016 12/12/2016  WBC 3.9 - 10.3 10e3/uL 3.0(L) 3.4(L) 3.3(L)  Hemoglobin 11.6 - 15.9 g/dL 12.7 11.9(L) 12.8  Hematocrit 34.8 - 46.6 % 37.4 34.7(L) 38.1  Platelets 145 - 400 10e3/uL 153 148(L) 153   ANC 1.6k . CMP Latest Ref Rng & Units 01/02/2017 12/22/2016 12/12/2016  Glucose 70 - 140 mg/dl 87 100(H) 93  BUN  7.0 - 26.0 mg/dL 11.9 11 10.8  Creatinine 0.6 - 1.1 mg/dL 0.9 1.03(H) 0.9  Sodium 136 - 145 mEq/L 143 143 144  Potassium 3.5 - 5.1 mEq/L 4.0 4.1 3.6  Chloride 101 - 111 mmol/L - 107 -  CO2 22 - 29 mEq/L 25 29 26   Calcium 8.4 - 10.4 mg/dL 8.8 8.5(L) 9.2  Total Protein 6.4 - 8.3 g/dL 6.3(L) 6.3(L) 6.6  Total Bilirubin 0.20 - 1.20 mg/dL 1.06 1.2 1.35(H)  Alkaline Phos 40 - 150 U/L 126 122 132  AST 5 - 34 U/L 24 22 32  ALT 0 - 55 U/L 12 16 24          RADIOGRAPHIC STUDIES: I have personally reviewed the radiological images as listed and agreed with the findings in the report.  .Ct Angio Head W Or Wo Contrast  Result Date: 12/22/2016 CLINICAL DATA:  Headache and dizziness. Subarachnoid hemorrhage suspected. EXAM: CT ANGIOGRAPHY HEAD TECHNIQUE: Multidetector CT imaging of the head was performed using the standard protocol during bolus administration of intravenous contrast. Multiplanar CT image reconstructions and MIPs were obtained to evaluate the vascular anatomy. CONTRAST:  75 mL Isovue 370 COMPARISON:  None. FINDINGS: CT HEAD Brain: No mass lesion, intraparenchymal hemorrhage or extra-axial collection. No evidence of acute cortical infarct. There is a peripherally calcified pineal gland cyst, an  incidental finding. Brain parenchyma is normal for age. Vascular: No hyperdense vessel or unexpected calcification. Skull: Normal visualized skull base, calvarium and extracranial soft tissues. Sinuses/Orbits: No sinus fluid levels or advanced mucosal thickening. No mastoid effusion. Normal orbits. CTA HEAD Anterior circulation: --Intracranial internal carotid arteries: Normal. --Anterior cerebral arteries: Normal. --Middle cerebral arteries: Normal. --Posterior communicating arteries: Present on the left. Posterior circulation: --Posterior cerebral arteries: Normal. --Superior cerebellar arteries: Normal. --Basilar artery: Normal. --Anterior inferior cerebellar arteries: Normal. --Posterior inferior cerebellar arteries: Normal. Normal codominant vertebral artery V4 segments. Venous sinuses: As permitted by contrast timing, patent. Anatomic variants: None Delayed phase: No abnormal intracranial enhancement. IMPRESSION: 1. No subarachnoid hemorrhage or other acute abnormality. 2. Normal intracranial CTA.  No aneurysm, occlusion or stenosis. Electronically Signed   By: Ulyses Jarred M.D.   On: 12/22/2016 13:54   Dg Chest 2 View  Result Date: 12/22/2016 CLINICAL DATA:  51 year old female with metastatic thyroid cancer. Headache, dizziness and hypertension since 2 a.m. Started chemotherapy 1 week ago. Initial encounter. EXAM: CHEST  2 VIEW COMPARISON:  PET CT 12/11/2016. FINDINGS: No infiltrate, congestive heart failure or pneumothorax. CT detected pulmonary nodules less well delineated on present plain film examination. Postsurgical changes upper thoracic spine. Heart size within normal limits. Left acromioclavicular joint degenerative changes. IMPRESSION: No infiltrate or congestive heart failure. CT detected pulmonary nodules less well delineated on present plain film exam. Postsurgical changes upper thoracic spine. Electronically Signed   By: Genia Del M.D.   On: 12/22/2016 10:23   Nm Pet Image Restag (ps)  Skull Base To Thigh  Result Date: 12/11/2016 CLINICAL DATA:  Subsequent treatment strategy for metastatic Hurthle cell carcinoma of thyroid. EXAM: NUCLEAR MEDICINE PET SKULL BASE TO THIGH TECHNIQUE: 12.6 mCi F-18 FDG was injected intravenously. Full-ring PET imaging was performed from the skull base to thigh after the radiotracer. CT data was obtained and used for attenuation correction and anatomic localization. FASTING BLOOD GLUCOSE:  Value: 96 mg/dl COMPARISON:  None. FINDINGS: NECK Two left level 4 nodes beneath the inferior aspect the sternocleidomastoid muscle are intensely hypermetabolic with SUV max equals 69 increased from SUV max of 22. Larger node measures  16 mm short axis (image 36, series 4) compared to 12 mm. CHEST Hypermetabolic RIGHT paratracheal lymph node is similar to prior. There bilateral small pulmonary nodules which are intensely hypermetabolic for size. These are not increased in size and measure approximately 5 mm each but increased in metabolic activity. For example LEFT upper lobe nodule measuring 5 mm (image 40, series 4) with SUV max equal 5.4 increase from SUV max equal 3.4. Similar nodule in the LEFT upper lobe on image 50, series 4. Nodule in the LEFT lower lobe measures 5 mm on image 17 of series 4 with SUV max equal 4.1 increased from 2.7. ABDOMEN/PELVIS No abnormal hypermetabolic activity within the liver, pancreas, adrenal glands, or spleen. No hypermetabolic lymph nodes in the abdomen or pelvis. SKELETON Intense metabolic activity at the upper thoracic spine lesions increased from prior with SUV max equal 70 increased from 33.9. This is site of prior radiation and surgical intervention. Lesion within the RIGHT aspect of the manubrium is also increase metabolic activity SUV max equal 33 decreased from 6.2 As reference the metabolic activity within liver and similar between the two scans. : 1. Concern for progression of metastatic Hurthle cell thyroid carcinoma with significant  increase in metabolic activity of several lymph nodes, pulmonary nodules, and skeletal metastasis. 2. Most convincing element of progression is a significant increase in metabolic activity (more than doubling). Mild increase in size of the lymph nodes. Electronically Signed   By: Suzy Bouchard M.D.   On: 12/11/2016 15:48     ASSESSMENT & PLAN:  51 year old female with   1) stage IVC metastatic Hurthle cell thyroid carcinoma with metastases to the spine, lung, superior mediastinal mass and nodal metastases. Improved based on last PET/CT  Patient has had treatment as noted above. Recently had cord compression at T3 level due to recurrent tumor status post partial resection and debulking. Recent MRI on 08/14/2015 showed improved but persistent cord compression at the T3 level. She has been noted not to be a candidate for further repeat surgery at this time or repeat radiation. Recent PET/CT scan is noted of bowel done at [facility] on 08/07/2015 showed evidence of tumor progression and patient was started on lenvatinib as per Dr. Leamon Arnt. Her thyroglobulin level was 11.7 with anti-body of 9.8 on 08/07/2015 at [facility]  PET/CT 11/01/2015: shows relatively stable though high burden of disease. Some concern of local recurrence in the thyroidectomy bed which is asymptomatic at this time. MRI thoracic spine 12/25/2015: No new abnormality since the prior examination. Status post T1-5 fusion for T3 decompression. Mass effect on the cord at the T3 level appears improved compared to the prior exam. Postoperative fluid collection likely representing a seroma seen on the prior study has slightly decreased in size.  PET/CT 06/03/2016 with Generally reduced hypermetabolic activity in the previously identified lesions, including the pulmonary nodules, dominant upper thoracic metastatic focus, and left lower cervical adenopathy. Mildly reduced size of previous lesions as well. No new lesions.  PET/CT 12/12/2016: Concern for  progression of metastatic Hurthle cell thyroid carcinoma with significant increase in metabolic activity of several lymph nodes, pulmonary nodules, and skeletal metastasis. Most convincing element of progression is a significant increase in metabolic activity (more than doubling). Mild increase in size of the lymph nodes.  2) grade 1 oral mucositis related to Lenvatinib -resolved  3) FDG avidity in the rectum - much improved. No significant concerns on last PET  4) abnormal LFTs with borderline elevation of transaminases and increase in alkaline  phosphatase. Mild transaminitis could be from her TKI. PET/ CT did not show any liver involvement or biliary obstruction. Elevation in alkaline phosphatase could be from additional bone involvement. Resolved on labs today Repeat labs today shows stable LFTs  5) Mild Anemia  -- likely from TKI -- now resolved Plan  - patient has no clinical evidence of Hurthle cell carcinoma progression at this time but recent PET/CT unfortunately does show evidence of disease progression with significant increase in metabolic activity of several lymph nodes, pulmonary nodules, and skeletal metastasis.despite well suppressed TSH levels. Thyroglobulin levels stable with neg ab . -Continue physical therapy- patient has been improving with PT -She has tolerated increased Lenvatinib to 22mg  po daily well and without complaint, we will now increase this to 24mg  po daily. She is agreeable with this.  --Her TSH appears well suppressed- she will be following with Dr. Chalmers Cater from endocrinology for this. -We will see her back in clinic in 4-5 weeks with labs and we will obtain PET following this to measure her response.   6) history of right-sided pulmonary embolism on chronic anticoagulation with Rivaroxaban. Monitor for bleeding. No bleeding reported  7)  HTN. -continue f/u with PCP and I informed her to begin taking her blood pressures in the mornings for a more accurate  reading.  -Recently upped to 20mg  Lisinopril daily, I will further increase this to 40mg  daily while we increase her Lenvatinib dose to 24mg  po daily.   RTC with Dr Irene Limbo in 4 weeks with labs   All of the patients questions were answered with apparent satisfaction. The patient knows to call the clinic with any problems, questions or concerns.  I spent 20 minutes counseling the patient face to face. The total time spent in the appointment was 25 minutes and more than 50% was on counseling and direct patient cares.    Sullivan Lone MD [place] AAHIVMS [facility] Hematology/Oncology Physician [place]  (Office):       [phone] (Work cell):  [phone] (Fax):           [phone]  This document serves as a record of services personally performed by Sullivan Lone, MD. It was created on his behalf by Reola Mosher, a trained medical scribe. The creation of this record is based on the scribe's personal observations and the provider's statements to them. This document has been checked and approved by the attending provider.

## 2017-01-06 ENCOUNTER — Ambulatory Visit: Payer: Medicare Other | Admitting: Physical Therapy

## 2017-01-07 DIAGNOSIS — M25512 Pain in left shoulder: Secondary | ICD-10-CM | POA: Diagnosis not present

## 2017-01-07 DIAGNOSIS — M7541 Impingement syndrome of right shoulder: Secondary | ICD-10-CM | POA: Diagnosis not present

## 2017-01-07 DIAGNOSIS — M25511 Pain in right shoulder: Secondary | ICD-10-CM | POA: Diagnosis not present

## 2017-01-07 DIAGNOSIS — G8929 Other chronic pain: Secondary | ICD-10-CM | POA: Diagnosis not present

## 2017-01-08 DIAGNOSIS — C73 Malignant neoplasm of thyroid gland: Secondary | ICD-10-CM | POA: Diagnosis not present

## 2017-01-08 DIAGNOSIS — G839 Paralytic syndrome, unspecified: Secondary | ICD-10-CM | POA: Diagnosis not present

## 2017-01-08 DIAGNOSIS — G8220 Paraplegia, unspecified: Secondary | ICD-10-CM | POA: Diagnosis not present

## 2017-01-09 ENCOUNTER — Ambulatory Visit: Payer: Medicare Other | Admitting: Physical Therapy

## 2017-01-13 ENCOUNTER — Ambulatory Visit: Payer: Medicare Other | Admitting: Physical Therapy

## 2017-01-15 ENCOUNTER — Ambulatory Visit: Payer: Medicare Other | Admitting: Physical Therapy

## 2017-01-20 ENCOUNTER — Ambulatory Visit: Payer: Medicare Other | Attending: Registered Nurse | Admitting: Physical Therapy

## 2017-01-20 ENCOUNTER — Other Ambulatory Visit: Payer: Self-pay | Admitting: Physical Medicine & Rehabilitation

## 2017-01-20 DIAGNOSIS — M6281 Muscle weakness (generalized): Secondary | ICD-10-CM | POA: Insufficient documentation

## 2017-01-20 DIAGNOSIS — R2689 Other abnormalities of gait and mobility: Secondary | ICD-10-CM | POA: Insufficient documentation

## 2017-01-21 ENCOUNTER — Telehealth: Payer: Self-pay | Admitting: *Deleted

## 2017-01-21 NOTE — Therapy (Signed)
[facility] [address] [place], [place], [address] Phone: [phone]   Fax:  [phone]  Physical Therapy Treatment  Patient Details  Name: LATORI BEGGS MRN: [id number] Date of Birth: 1965/05/13 Referring Provider: Danella Sensing, NP  Encounter Date: 01/20/2017      PT End of Session - 01/21/17 1430    Visit Number 24  G1   Number of Visits 32   Date for PT Re-Evaluation 02/20/17   Authorization Type [employer] Medicare   Authorization Time Period 01-20-17 - 03-21-17   PT Start Time 1103   PT Stop Time 1146   PT Time Calculation (min) 43 min   Equipment Utilized During Treatment Gait belt      Past Medical History:  Diagnosis Date  . 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 Chronic back pain   . DDD (degenerative disc disease), cervical   . DJD (degenerative joint disease)   . History of rectal fissure   . HIT (heparin-induced thrombocytopenia) ([place])   . Hypertension   . Obesity   . 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 ([place])   . Pulmonary embolus, right ([place]) 2015  . Rotator cuff tendonitis right    Past Surgical History:  Procedure Laterality Date  . LAMINECTOMY N/A 12/14/2013   Procedure: THORACIC LAMINECTOMY FOR TUMOR THORACIC THREE;  Surgeon: Ashok Pall, MD;  Location: [facility];  Service: Neurosurgery;  Laterality: N/A;  THORACIC LAMINECTOMY FOR TUMOR THORACIC THREE  . LAMINECTOMY N/A 07/05/2015   Procedure: THORACIC LAMINECTOMY FOR TUMOR;  Surgeon: Ashok Pall, MD;  Location: [facility];  Service: Neurosurgery;  Laterality: N/A;  . POSTERIOR LUMBAR FUSION 4 LEVEL N/A 12/30/2013   Procedure: Thoracic one-Thoracic five posterior thoracic fusion with pedicle screws;  Surgeon: Ashok Pall, MD;  Location: [facility];  Service: Neurosurgery;  Laterality: N/A;  Thoracic one-Thoracic five posterior thoracic fusion with pedicle screws  . THYROIDECTOMY N/A 01/09/2014   Procedure: TOTAL THYROIDECTOMY;  Surgeon: Izora Gala, MD;  Location: [facility];  Service: ENT;  Laterality: N/A;  . TONSILLECTOMY      There were no vitals filed for this visit.      Subjective Assessment - 01/21/17 1426    Subjective Pt states she has had BP issues and has been sick - states she has not walked alot at home in past month   [name] accompained by: --  friend   Pertinent History thoracic laminectomy for spinal tumor 12-14-13; 07-05-15 for 2nd sx due to tumor regrowth:  DDD; Rt RTC tendonitis; DDD and DJD:  chronic back pain   Patient Stated Goals get legs stronger and to walk   Currently in Pain? No/denies                         [facility] Adult PT Treatment/Exercise - 01/21/17 0001      Transfers   Sit to Stand 4: Min assist   Sit to Stand Details Verbal cues for sequencing;Verbal cues for technique;Tactile cues for posture   Five time sit to stand comments  1'37.66" for 5x sit to stand   Stand to Sit 4: Min assist;3: Mod assist     Ambulation/Gait   Ambulation/Gait Yes   Ambulation/Gait Assistance 4: Min assist   Ambulation/Gait Assistance Details cues to tuck hips and stand erect   Ambulation Distance (Feet) 12 Feet  21, 25'   Assistive device Rolling walker  Gait Pattern Step-through pattern   Ambulation Surface Level;Indoor                  PT Short Term Goals - 10/08/16 1456      PT SHORT TERM GOAL #6   Title Amb. 20' at home with RW with +1 mod assist.  10-02-16   Time --   Period --   Status Partially Met     PT SHORT TERM GOAL #7   Title Perform sit to stand from wheelchair to RW with +1 min assist.  10-02-16   Baseline 10/08/16: needed varied assistance from min<>mod assist    Time --   Period --   Status Partially Met           PT Long Term Goals - 01/21/17 1431      PT LONG TERM GOAL #1   Title Improve 5x sit to stand from 1'37.66 secs to </= 1' 15 secs to demo improved functional mobility.   Time 4   Period  Weeks   Status New   Target Date 02/20/17     PT LONG TERM GOAL #2   Title Stand at countertop (or at trampoline at home) modified independently for at least 5" per pt report to demo increased LE strength and improved standing tolerance   Baseline Pt reports standing for approx. 1" at home - 01-20-17   Status On-going   Target Date 02/20/17     PT LONG TERM GOAL #3   Title Pt will perform sit to stand from wheelchair to walker SBA   Time 4   Period Weeks   Status New   Target Date 02/20/17     PT LONG TERM GOAL #4   Title Negotiate 4" step inside //bars with +2 min assist.   Baseline +2 mod assist needed   Time 4   Period Weeks   Status On-going   Target Date 02/20/17     PT LONG TERM GOAL #5   Title Pt will tolerate rolling prone and maintaining position for at least 10 mins for stretching hip flexors.     Time 4   Period Weeks   Status New   Target Date 02/20/17     PT LONG TERM GOAL #6   Title Pt will amb. 30' at home with RW with min assist +1   Baseline min assist needed of 2 people   Status On-going   Target Date 02/20/17               Plan - 01/21/17 1436    Clinical Impression Statement Pt is progressing slowly with gait and endurance due to pt ambulating very little at home due to fear and anxiety with standing;  pt demonstrates decreased endurance and fatigues easily with standing/ambulation   Rehab Potential Good   Clinical Impairments Affecting Rehab Potential severity of impairments and length of time since onset   PT Frequency 2x / week   PT Duration 4 weeks   PT Treatment/Interventions ADLs/Self Care Home Management;Therapeutic exercise;Balance training;Therapeutic activities;Functional mobility training;Gait training;Neuromuscular re-education;Patient/family education;DME Instruction;Wheelchair mobility training;Passive range of motion   PT Next Visit Plan cont gait training; sit to/from stand transfers   PT Home Exercise Plan stretches and LE  strengthening, continue to work on sit/stand transfers, gait with RW. standing balance activities   Consulted and Agree with Plan of Care Patient      Patient will benefit from skilled therapeutic intervention in order to improve the following deficits  and impairments:  Abnormal gait, Difficulty walking, Decreased balance, Decreased activity tolerance, Impaired tone, Decreased strength, Increased edema  Visit Diagnosis: Other abnormalities of gait and mobility - Plan: PT plan of care cert/re-cert  Muscle weakness (generalized) - Plan: PT plan of care cert/re-cert       G-Codes - Feb 15, 2017 1439    Functional Assessment Tool Used (Outpatient Only) Pt amb. 25' with RW with +2 min assist; 5x sit to stand from hi/lo mat table 1"37.66 secs to RW   Functional Limitation Mobility: Walking and moving around   Mobility: Walking and Moving Around Current Status [phone]) At least 60 percent but less than 80 percent impaired, limited or restricted   Mobility: Walking and Moving Around Goal Status [phone]) At least 40 percent but less than 60 percent impaired, limited or restricted      Problem List Patient Active Problem List   Diagnosis Date Noted  . Counseling regarding advanced care planning and goals of care 01/02/2017  . Bilateral rotator cuff syndrome 02/12/2016  . E. coli UTI 08/01/2015  . Constipation due to neurogenic bowel 08/01/2015  . Muscle spasticity   . 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 Spastic paraparesis   . Neurogenic bladder   . History of pulmonary embolism   . Post-operative pain   . Metastatic cancer ([place])   . Steroid-induced hyperglycemia   . Depression   . Obstipation   . Thoracic spine tumor 07/06/2015  . 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 Cord compression ([place])   . Postoperative hypothyroidism   . Secondary malignant neoplasm of vertebral column ([place]) 03/08/2015  . Spasticity 09/19/2014  . Hypertension 05/01/2014  . Ingrown right  big toenail 05/01/2014  . GERD (gastroesophageal reflux disease) 02/14/2014  . Dysphagia, pharyngoesophageal phase 02/06/2014  . Type 2 diabetes mellitus without complication ([place]) 09/73/5329  . Constipation   . Neurogenic bowel   . 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 Metastatic cancer to spine ([place]) 01/19/2014  . Rotator cuff tendonitis   . 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 Leg weakness, bilateral 12/13/2013    DildayJenness Corner, PT 01/21/2017, 2:48 PM  [facility] [address] [place], [place], [address] Phone: [phone]   Fax:  [phone]  Name: TIAUNNA BUFORD MRN: [id number] Date of Birth: 07/05/1965

## 2017-01-21 NOTE — Telephone Encounter (Signed)
Patient called requesting Rx for oral thrush.  [name] out of office for the rest of the day.  LVM with patient, will review with [name] in the morning and call back with updates.  Call back number provided.

## 2017-01-22 ENCOUNTER — Other Ambulatory Visit: Payer: Self-pay | Admitting: *Deleted

## 2017-01-22 ENCOUNTER — Ambulatory Visit: Payer: Medicare Other | Admitting: Physical Therapy

## 2017-01-22 MED ORDER — NYSTATIN 100000 UNIT/ML MT SUSP: 5.0000 mL | Freq: Four times a day (QID) | OROMUCOSAL | 0 refills | Status: AC

## 2017-01-26 ENCOUNTER — Ambulatory Visit (INDEPENDENT_AMBULATORY_CARE_PROVIDER_SITE_OTHER): Payer: Medicare Other | Admitting: Family Medicine

## 2017-01-26 ENCOUNTER — Ambulatory Visit: Payer: Medicare Other | Admitting: Family Medicine

## 2017-01-26 ENCOUNTER — Encounter: Payer: Self-pay | Admitting: Family Medicine

## 2017-01-26 VITALS — BP 148/90 | HR 74 | Temp 97.8°F | Wt 248.0 lb

## 2017-01-26 DIAGNOSIS — Z1231 Encounter for screening mammogram for malignant neoplasm of breast: Secondary | ICD-10-CM

## 2017-01-26 DIAGNOSIS — N644 Mastodynia: Secondary | ICD-10-CM | POA: Diagnosis not present

## 2017-01-26 DIAGNOSIS — C73 Malignant neoplasm of thyroid gland: Secondary | ICD-10-CM | POA: Diagnosis not present

## 2017-01-26 DIAGNOSIS — J069 Acute upper respiratory infection, unspecified: Secondary | ICD-10-CM | POA: Diagnosis not present

## 2017-01-26 DIAGNOSIS — R079 Chest pain, unspecified: Secondary | ICD-10-CM | POA: Insufficient documentation

## 2017-01-26 DIAGNOSIS — Z1239 Encounter for other screening for malignant neoplasm of breast: Secondary | ICD-10-CM

## 2017-01-26 DIAGNOSIS — E890 Postprocedural hypothyroidism: Secondary | ICD-10-CM | POA: Diagnosis not present

## 2017-01-26 NOTE — Patient Instructions (Signed)
Thank you for coming to see me today. It was a pleasure! Today we talked about:   Breast pain. Please have your mammogram completed. If you pain persists, please consider follow up with your PCP or oncologist for possible referred pain from your back.  Please stop the amoxicillin for your upper respiratory symptoms.   If you have any questions or concerns, please do not hesitate to call the office at [phone].  Take Care,   [place] Tayten Heber, DO

## 2017-01-26 NOTE — Assessment & Plan Note (Signed)
Patient reporting congestion and headache with sore throat.  This has only been going on for 5 days now.  Patient reports it is improving.  Instructed to stop taking the amoxicillin from her nephew.

## 2017-01-26 NOTE — Assessment & Plan Note (Signed)
Patient complaining of 2 weeks of breast pain.  Reports that it is a pulling sensation with a dull ache.  Referred for mammography.  Patient instructed to follow-up with her oncologist or PCP if pain persists as it might be referred from T4.  Reports that she will try a new bra and thinks that this will help.

## 2017-01-26 NOTE — Progress Notes (Signed)
Subjective:    Patient ID: Sarah [name], female    DOB: 01/21/1966, 51 y.o.   MRN: [id number]   CC: Breast pain and mammogram  HPI: Breast pain: Patient has been experiencing bilateral breast pain for 2 weeks.  - Reports that she called to have a mammogram scheduled and they asked about her breast pain and instructed her to come in for a PCP visit. -Patient reports that she has had a weight loss of about 100 pounds and she attributes her breast pain to the weight loss.  Reports that it feels more like a tugging, dull ache.   -Denies any sharp shooting pains.  Denies numbness or tingling.  Denies any discharge.  Denies nipple tenderness.  Upper Respiratory Symptoms: Patient reports that she has been having congestion and right ear pain since 01/22/2017.   -Reports that on 11/2 she started taking her nephew's amoxicillin.  She is not following the recommended dosage on the bottle and does not remember what it was.  She reports that she has not feeling any better and she has not tried any other medications.   -She reports that she is having a sore throat as well as a productive cough with green mucus.   -Denies rhinorrhea.  Denies fever.  Reports frontal headaches.  Smoking status reviewed  Review of Systems  Per HPI.   Patient Active Problem List   Diagnosis Date Noted  . Breast pain 01/26/2017  . Upper respiratory infection, viral 01/26/2017  . Counseling regarding advanced care planning and goals of care 01/02/2017  . Bilateral rotator cuff syndrome 02/12/2016  . E. coli UTI 08/01/2015  . Constipation due to neurogenic bowel 08/01/2015  . Muscle spasticity   . 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 Spastic paraparesis   . Neurogenic bladder   . History of pulmonary embolism   . Post-operative pain   . Metastatic cancer ([place])   . Steroid-induced hyperglycemia   . Depression   . Obstipation   . Thoracic spine tumor 07/06/2015  . 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 Cord compression ([place])   . Postoperative hypothyroidism   . Secondary malignant neoplasm of vertebral column ([place]) 03/08/2015  . Spasticity 09/19/2014  . Hypertension 05/01/2014  . Ingrown right big toenail 05/01/2014  . GERD (gastroesophageal reflux disease) 02/14/2014  . Dysphagia, pharyngoesophageal phase 02/06/2014  . Type 2 diabetes mellitus without complication ([place]) 09/14/7626  . Constipation   . Neurogenic bowel   . 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 Metastatic cancer to spine ([place]) 01/19/2014  . Rotator cuff tendonitis   . 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 Leg weakness, bilateral 12/13/2013     Objective:  BP (!) 148/90   Pulse 74   Temp 97.8 F (36.6 C) (Oral)   Wt 248 lb (112.5 kg)   LMP 10/22/2016   SpO2 98%   BMI 34.59 kg/m  Vitals and nursing note reviewed  General: NAD, pleasant, in wheelchair HEENT HEENT: Left TM clear.  Right TM white with slight protrusion.  Throat non-erythematous.  Moist mucous membranes. Cardiac: RRR, normal heart sounds. 2+ radial and PT pulses bilaterally Respiratory: CTAB, normal effort Breast: Nontender on exam with no cysts noted.  No discharge noted.  No axillary lymph nodes felt. Extremities: no edema or cyanosis. WWP. Skin: warm and dry, no rashes noted Neuro: alert and oriented, no focal deficits   Assessment & Plan:  Breast pain Patient complaining of 2 weeks of breast pain.  Reports that it is a pulling sensation with a dull ache.  Referred for mammography.  Patient instructed to follow-up with her oncologist or PCP if pain persists as it might be referred from T4.  Reports that she will try a new bra and thinks that this will help.  Upper respiratory infection, viral Patient reporting congestion and headache with sore throat.  This has only been going on for 5 days now.  Patient reports it is improving.  Instructed to stop  taking the amoxicillin from her nephew.      [place] Jakayden Cancio, DO Family Medicine Resident PGY-1

## 2017-01-27 ENCOUNTER — Ambulatory Visit: Payer: Medicare Other | Admitting: Physical Therapy

## 2017-01-29 ENCOUNTER — Ambulatory Visit: Payer: Medicare Other | Admitting: Physical Therapy

## 2017-01-30 ENCOUNTER — Ambulatory Visit: Payer: Medicare Other | Admitting: Hematology

## 2017-01-30 ENCOUNTER — Other Ambulatory Visit: Payer: Medicare Other

## 2017-01-30 ENCOUNTER — Telehealth: Payer: Self-pay

## 2017-01-30 ENCOUNTER — Other Ambulatory Visit: Payer: Self-pay

## 2017-01-30 DIAGNOSIS — C73 Malignant neoplasm of thyroid gland: Secondary | ICD-10-CM

## 2017-01-30 NOTE — Telephone Encounter (Signed)
Pt called with no answer due to not showing for todays appointment.  RN left a message to instruct pt to call to reschedule.

## 2017-02-06 ENCOUNTER — Other Ambulatory Visit: Payer: Self-pay | Admitting: Family Medicine

## 2017-02-06 DIAGNOSIS — Z1231 Encounter for screening mammogram for malignant neoplasm of breast: Secondary | ICD-10-CM

## 2017-02-08 DIAGNOSIS — C73 Malignant neoplasm of thyroid gland: Secondary | ICD-10-CM | POA: Diagnosis not present

## 2017-02-08 DIAGNOSIS — G8220 Paraplegia, unspecified: Secondary | ICD-10-CM | POA: Diagnosis not present

## 2017-02-08 DIAGNOSIS — G839 Paralytic syndrome, unspecified: Secondary | ICD-10-CM | POA: Diagnosis not present

## 2017-02-16 MED FILL — LENVIMA 24 MG DAILY DOSE: 2 X 10 MG & | 30 days supply | Qty: 90 | Fill #1

## 2017-02-17 ENCOUNTER — Ambulatory Visit: Payer: Medicare Other | Admitting: Physical Therapy

## 2017-02-19 ENCOUNTER — Other Ambulatory Visit: Payer: Self-pay | Admitting: Physical Medicine & Rehabilitation

## 2017-02-24 ENCOUNTER — Ambulatory Visit: Payer: Medicare Other | Admitting: Physical Therapy

## 2017-02-26 ENCOUNTER — Ambulatory Visit: Payer: Medicare Other | Admitting: Family Medicine

## 2017-02-27 ENCOUNTER — Telehealth: Payer: Self-pay | Admitting: *Deleted

## 2017-02-27 NOTE — Telephone Encounter (Signed)
Sw pt regarding upcoming inclement weather.  Pt wishes to reschedule apts 12/10.  High priority message sent to scheduling to reschedule apts.

## 2017-03-02 ENCOUNTER — Other Ambulatory Visit: Payer: Medicare Other

## 2017-03-02 ENCOUNTER — Ambulatory Visit: Payer: Medicare Other | Admitting: Hematology

## 2017-03-03 ENCOUNTER — Ambulatory Visit: Payer: Medicare Other | Admitting: Physical Therapy

## 2017-03-09 ENCOUNTER — Telehealth: Payer: Self-pay | Admitting: Hematology

## 2017-03-09 NOTE — Telephone Encounter (Signed)
Rescheduled appt from 12/10 - left voicemail for patient regarding appt. Sending confirmation letter in the mail as well.

## 2017-03-10 ENCOUNTER — Other Ambulatory Visit: Payer: Self-pay | Admitting: Hematology

## 2017-03-10 ENCOUNTER — Ambulatory Visit: Payer: Medicare Other | Admitting: Physical Therapy

## 2017-03-10 DIAGNOSIS — C73 Malignant neoplasm of thyroid gland: Secondary | ICD-10-CM | POA: Diagnosis not present

## 2017-03-10 DIAGNOSIS — G839 Paralytic syndrome, unspecified: Secondary | ICD-10-CM | POA: Diagnosis not present

## 2017-03-10 DIAGNOSIS — G8220 Paraplegia, unspecified: Secondary | ICD-10-CM | POA: Diagnosis not present

## 2017-03-11 ENCOUNTER — Encounter: Payer: Medicare Other | Admitting: Physical Medicine & Rehabilitation

## 2017-03-11 ENCOUNTER — Other Ambulatory Visit: Payer: Self-pay

## 2017-03-11 MED ORDER — LENVATINIB (24 MG DAILY DOSE) 2 X 10 MG & 4 MG PO CPPK: 24.0000 mg | ORAL_CAPSULE | Freq: Every day | ORAL | 0 refills | Status: DC

## 2017-03-11 MED FILL — LENVIMA 24 MG DAILY DOSE: 2 X 10 MG & | 30 days supply | Qty: 90 | Fill #0

## 2017-03-12 ENCOUNTER — Ambulatory Visit: Payer: Medicare Other

## 2017-03-26 ENCOUNTER — Other Ambulatory Visit: Payer: Medicare Other

## 2017-03-26 ENCOUNTER — Ambulatory Visit: Payer: Medicare Other | Admitting: Hematology

## 2017-03-31 ENCOUNTER — Other Ambulatory Visit: Payer: Medicare Other

## 2017-03-31 ENCOUNTER — Ambulatory Visit: Payer: Medicare Other | Admitting: Physical Therapy

## 2017-03-31 ENCOUNTER — Ambulatory Visit: Payer: Medicare Other | Admitting: Hematology

## 2017-04-01 ENCOUNTER — Other Ambulatory Visit: Payer: Self-pay

## 2017-04-01 ENCOUNTER — Encounter: Payer: Self-pay | Admitting: Family Medicine

## 2017-04-01 ENCOUNTER — Telehealth: Payer: Self-pay | Admitting: Hematology

## 2017-04-01 ENCOUNTER — Ambulatory Visit (INDEPENDENT_AMBULATORY_CARE_PROVIDER_SITE_OTHER): Payer: Medicare Other | Admitting: Family Medicine

## 2017-04-01 VITALS — BP 220/110 | HR 91 | Temp 97.9°F | Ht 71.0 in

## 2017-04-01 DIAGNOSIS — I10 Essential (primary) hypertension: Secondary | ICD-10-CM

## 2017-04-01 DIAGNOSIS — R300 Dysuria: Secondary | ICD-10-CM | POA: Diagnosis not present

## 2017-04-01 DIAGNOSIS — R0981 Nasal congestion: Secondary | ICD-10-CM

## 2017-04-01 MED ORDER — CIPROFLOXACIN HCL 500 MG PO TABS: 500.0000 mg | ORAL_TABLET | Freq: Two times a day (BID) | ORAL | 0 refills | Status: AC

## 2017-04-01 MED ORDER — FLUTICASONE PROPIONATE 50 MCG/ACT NA SUSP
2.0000 | Freq: Every day | NASAL | 6 refills | Status: DC
Start: 2017-04-01 — End: 2018-02-23

## 2017-04-01 NOTE — Progress Notes (Signed)
Subjective:    Patient ID: [name] , female   DOB: 01-31-1966 , 52 y.o..   MRN: [id number]  HPI  ADISSON DEAK is a 52 year old female with past medical history of hypertension, paraplegia, metastatic cancer (FNA of thyroid positive for onconytic/hurthle cell carcinoma), type 2 diabetes, neurogenic bladder here for  Chief Complaint  Patient presents with  . Nasal Congestion    1. DYSURIA  Pain urinating started 14 days ago. Pain is: at the bottom of her back Medications tried: nothing Any antibiotics in the last 30 days: Yes, she took 6 pills of her mother's amoxicillin 2 weeks ago More than 3 UTIs in the last 12 months: no STD exposure: no Possibly pregnant: no  Symptoms Urgency: yes Frequency: yes Blood in urine: no Pain in back:yes Fever: no Vaginal discharge: no Mouth Ulcers: no  2. URI  Major symptoms: has had runny nose  For the last 3 months, then developed sinus pain 2 weeks ago Progression: pain in face has been getting progressively worse Medications tried: nasal deconge Sick contacts: none Patient believes may be caused by unsure  Symptoms Fever: no Headache or face pain: yes Tooth pain: no Sneezing: yes Scratchy throat: no Allergies: yes Muscle aches: no Severe fatigue: no Stiff neck: no Shortness of breath: no Rash: no Sore throat or swollen glands: no  Review of Symptoms - see HPI PMH - Smoking status noted.     Past Medical History: Patient Active Problem List   Diagnosis Date Noted  . Nasal congestion 04/05/2017  . Breast pain 01/26/2017  . Upper respiratory infection, viral 01/26/2017  . Counseling regarding advanced care planning and goals of care 01/02/2017  . Bilateral rotator cuff syndrome 02/12/2016  . E. coli UTI 08/01/2015  . Constipation due to neurogenic bowel 08/01/2015  . Muscle spasticity   . 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 Spastic paraparesis   . Neurogenic bladder   . History of pulmonary embolism   .  Post-operative pain   . Metastatic cancer ([place])   . Steroid-induced hyperglycemia   . Depression   . Obstipation   . Thoracic spine tumor 07/06/2015  . 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 Cord compression ([place])   . Postoperative hypothyroidism   . Secondary malignant neoplasm of vertebral column ([place]) 03/08/2015  . Spasticity 09/19/2014  . Dysuria 09/04/2014  . Hypertension 05/01/2014  . Ingrown right big toenail 05/01/2014  . GERD (gastroesophageal reflux disease) 02/14/2014  . Dysphagia, pharyngoesophageal phase 02/06/2014  . Type 2 diabetes mellitus without complication ([place]) 67/20/9470  . Constipation   . Neurogenic bowel   . 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 Metastatic cancer to spine ([place]) 01/19/2014  . Rotator cuff tendonitis   . 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 Leg weakness, bilateral 12/13/2013    Medications: reviewed   Social Hx:  reports that  has never smoked. she has never used smokeless tobacco.   Objective:   BP (!) 220/110   Pulse 91   Temp 97.9 F (36.6 C) (Oral)   Ht 5\' 11"  (1.803 m)   SpO2 99%   BMI 34.59 kg/m  Physical Exam  Gen: NAD, alert, cooperative with exam, well-appearing HEENT:     Head: Normocephalic, atraumatic    Neck: No masses palpated. No goiter. No lymphadenopathy     Ears: External ears normal, no drainage.Tympanic membranes intact, normal light reflex bilaterally,  no erythema or bulging    Eyes: PERRLA, EOMI, sclera white, normal conjunctiva    Nose: nasal turbinates moist, nasal congestion present    Throat: moist mucus membranes, no pharyngeal erythema, no tonsillar exudate. Airway is patent Maxillary sinuses mildly tender to palpation Cardiac: Regular rate and rhythm, normal S1/S2, no murmur, 2+ pittting edema in ankles, capillary refill brisk  Respiratory: Clear to auscultation bilaterally, no wheezes, non-labored  breathing Psych: good insight, normal mood and affect  Assessment & Plan:  Hypertension BP very high today, 220/120, checked twice throughout visit and ready same number.  Patient without any acute neurological deficits.  She has not been taking her hydrochlorothiazide or Lasix because she has had dysuria and did not want to urinate. -Advised patient to please resume her Lasix and hydrochlorothiazide -Will return to clinic in 2 days for blood pressure check -If BP remains elevated can consider obtaining BMP and giving dose of clonidine  Dysuria Dysuria for approximately 2 weeks now.  Patient unable to provide urine sample today.  She is wheelchair-bound and has a history of paraplegia and neurogenic bladder.  She notes that she has been urinating very frequently.  Is not been taking her Lasix or hydrochlorothiazide. -We will treat prophylactically with ciprofloxacin for 3-day course - If patient has any pelvic pain or difficulty urinating discussed that she should go to the ED -Other return precautions discussed  Nasal congestion Patient describes history of nasal congestion and intermittent sinus pain.  She may have chronic sinusitis.  Is mildly tender on maxillary sinuses.  She is afebrile and well-appearing.  Nasal congestion noted on exam. -Discussed daily Flonase usage -If her symptoms persist or worsen can consider giving antibiotics for acute sinusitis  Orders Placed This Encounter  Procedures  . POCT urinalysis dipstick   Meds ordered this encounter  Medications  . ciprofloxacin (CIPRO) 500 MG tablet    Sig: Take 1 tablet (500 mg total) by mouth 2 (two) times daily for 3 days.    Dispense:  6 tablet    Refill:  0  . fluticasone (FLONASE) 50 MCG/ACT nasal spray    Sig: Place 2 sprays into both nostrils daily.    Dispense:  16 g    Refill:  6    Smitty Cords, MD [facility], PGY-3

## 2017-04-01 NOTE — Patient Instructions (Signed)
Thank you for coming in today, it was so nice to see you! Today we talked about:    UTI symptoms: We are going to treat you with an antibiotic called ciprofloxacin.  You will take this medication for 3 days  Sinusitis; please use the Flonase spray as prescribed  Blood pressure; please take all of your blood pressure medications starting today  Please follow up on Friday (04/03/16) to follow-up on UTI and blood pressure. You can schedule this appointment at the front desk before you leave or call the clinic.  Bring in all your medications or supplements to each appointment for review.   If we ordered any tests today, you will be notified via telephone of any abnormalities. If everything is normal you will get a letter in the mail.   If you have any questions or concerns, please do not hesitate to call the office at [phone]. You can also message me directly via MyChart.   Sincerely,  Smitty Cords, MD

## 2017-04-02 ENCOUNTER — Ambulatory Visit: Payer: Medicare Other | Admitting: Family Medicine

## 2017-04-03 ENCOUNTER — Ambulatory Visit: Payer: Medicare Other | Admitting: Family Medicine

## 2017-04-05 DIAGNOSIS — R0981 Nasal congestion: Secondary | ICD-10-CM | POA: Insufficient documentation

## 2017-04-05 NOTE — Assessment & Plan Note (Addendum)
Patient describes history of nasal congestion and intermittent sinus pain.  She may have chronic sinusitis.  Is mildly tender on maxillary sinuses.  She is afebrile and well-appearing.  Nasal congestion noted on exam. -Discussed daily Flonase usage -If her symptoms persist or worsen can consider giving antibiotics for acute sinusitis

## 2017-04-05 NOTE — Assessment & Plan Note (Signed)
BP very high today, 220/120, checked twice throughout visit and ready same number.  Patient without any acute neurological deficits.  She has not been taking her hydrochlorothiazide or Lasix because she has had dysuria and did not want to urinate. -Advised patient to please resume her Lasix and hydrochlorothiazide -Will return to clinic in 2 days for blood pressure check -If BP remains elevated can consider obtaining BMP and giving dose of clonidine

## 2017-04-05 NOTE — Assessment & Plan Note (Addendum)
Dysuria for approximately 2 weeks now.  Patient unable to provide urine sample today.  She is wheelchair-bound and has a history of paraplegia and neurogenic bladder.  She notes that she has been urinating very frequently.  Is not been taking her Lasix or hydrochlorothiazide. -We will treat prophylactically with ciprofloxacin for 3-day course - If patient has any pelvic pain or difficulty urinating discussed that she should go to the ED -Other return precautions discussed

## 2017-04-07 ENCOUNTER — Other Ambulatory Visit: Payer: Self-pay | Admitting: Hematology

## 2017-04-07 ENCOUNTER — Ambulatory Visit: Payer: Medicare Other | Admitting: Physical Therapy

## 2017-04-08 ENCOUNTER — Telehealth: Payer: Self-pay

## 2017-04-09 ENCOUNTER — Telehealth: Payer: Self-pay

## 2017-04-09 NOTE — Telephone Encounter (Signed)
Pt requesting lenvima refill. [name] would like to have pt come in for labs and doctor visit to perform toxicity check before reordering the medication. Left VM with patient and sent a scheduling message for lab and doctor visit within the next two weeks.

## 2017-04-10 ENCOUNTER — Telehealth: Payer: Self-pay

## 2017-04-10 ENCOUNTER — Other Ambulatory Visit: Payer: Self-pay | Admitting: Physical Medicine & Rehabilitation

## 2017-04-10 DIAGNOSIS — G839 Paralytic syndrome, unspecified: Secondary | ICD-10-CM | POA: Diagnosis not present

## 2017-04-10 DIAGNOSIS — C73 Malignant neoplasm of thyroid gland: Secondary | ICD-10-CM | POA: Diagnosis not present

## 2017-04-10 DIAGNOSIS — G8220 Paraplegia, unspecified: Secondary | ICD-10-CM

## 2017-04-10 NOTE — Telephone Encounter (Signed)
Pt called in to confirm appointment for 1/21. Pt performed readback of appointments and times. Pt will arrive 72min prior to lab appt on Monday.

## 2017-04-10 NOTE — Telephone Encounter (Signed)
Recieved electronic medication refill request for xarelto, last note stated:  H/o PE/Anticoagulation: Pharmaceutical: Xarelto,  Not sure if that means to continue, hand to another provider, or what next steps are.  Please advise.

## 2017-04-10 NOTE — Progress Notes (Signed)
[name] Kitchen    HEMATOLOGY/ONCOLOGY CLINIC NOTE  Date of Service: .04/13/2017   Patient Care Team: [name], MD as PCP - General (Family Medicine) [name], MD as Consulting Physician (Physical Medicine and Rehabilitation) Ashok Pall, MD as Consulting Physician (Neurosurgery) Brunetta Genera, MD as Consulting Physician (Hematology) Jacelyn Pi, MD as Consulting Physician (Endocrinology)   Dayton Bailiff MD ([facility] [place]) [phone]. 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  01/09/2014- patient underwent total thyroidectomy by Dr. Elie Goody. Pathology showed Hurthle cell carcinoma with no perineural invasion, presence of lymphovascular invasion and extra thyroidal extension. p T3, p N0, Mx  Received 30 grays in 10 fractions to her C7-T6 lesion finished in November 2015  Received 135 mCi of iodine 131 on 07/21/2014. Whole-body scan was apparently negative.  07/05/2015-07/10/2015 admitted to [facility] for leg weakness. MRI of the T-spine showed recurrent tumor causing spinal cord compression. Patient was emergently taken to the or and underwent a redo thoracic laminectomy of T3 and partial tumor resection.   08/07/2015 PET/CT scan of the chest abdomen pelvis showed slight enlargement of FDG avid left level III and level IV lymph nodes concerning for worsening nodal metastases. Slight enlargement of the hypermetabolic right superior mediastinal mass and multiple metabolically active pulmonary nodules representing worsening metastases. T3 vertebral body and bilateral pedicle metastases  status post posture metallic fusion. No residual uptake in the thyroidectomy bed  Thyroglobulin level was 11.7 (thyroglobulin antibody present 9.8)   HISTORY OF PRESENTING ILLNESS:   Sarah [name] is a wonderful 52 y.o. female who has been referred to [place] by Dr Leamon Arnt (medical oncology at [facility]) for help with local comanagement of the patient's metastatic Hurthle cell thyroid carcinoma with details as noted above. Patient had recent hospitalization from 4/13/7 to 07/10/2015 at [facility] for lower extremity weakness and was noted to have cord compression from recurrent tumor at T3. She is status post redo thoracic laminectomy of T3 and partial tumor resection to try to decompress the core. Pathology was consistent with metastatic Hurthle cell carcinoma.  She has been on dexamethasone 2 mg by mouth twice a day. Continue to have lower extremity weakness and is currently in a skilled nursing facility getting rehabilitation. She feels her lower extremity weakness is possibly getting somewhat better.  Patient had a repeat PET/CT scan at [facility] on 08/07/2015 as noted above which shows progression of her metastatic disease including nodal metastases , pulmonary nodules and right superior mediastinal mass.  MRI of the spine on 08/14/2015 showed improved but persistent cord compression at T3 level.  She was started by Dr. Leamon Arnt on Lenvatinib and 14 mg by mouth daily for progressive metastatic disease after ruling out the option of repeat surgery with Dr. Cyndy Freeze and reradiation with Dr. Ledon Snare.  After a week or so the patient was increased to a dose of 18mg  po daily. She notes no diarrhea or other gastrointestinal symptoms. No skin rashes.  Is noted to have oral thrush and was started on nystatin swish and swallow.   Patient has a history of pulmonary embolism 2015 and has  continued to be on Xarelto for anticoagulation.   INTERVAL HISTORY  Sarah [name] returns today  for a follow up and she is accompanied by her sister. She is doing well overall and enjoyed the holidays.   She has had CBC on 04/13/17 which are as follows: all values are WNL except for WBC at 3.1k/ANC wnl1.8k, and albumin at 3.0.   The pt was evaluated at her PCP for nasal congestion x 2 weeks and was prescribed Cipro for 3 days. Hasn't taken BP medicine this morning prior to her arrival to the office and she notes that she is prescribed 40mg  Lisinopril. She used to take lisinopril-HCTZ, but reports emesis after taking the medication. She also tried HCTZ alone and had similar symptoms, thus was only placed on lisinopril. One month ago she had her synthroid medication dose reduced from 175 mcg to 150 mcg.She also has a prescription for Lasix but is not yet using it. She had one prior episode of a gout flare that resolved with her Rx cochicine.  She has also not been going to PT but is able to transfer herself from [facility] to bed. No new leg swelling. No mouth sores. No other reported toxicities from the increased dose of Lenvatinib.  On review of systems, pt reports steady breathing, nasal congestion, leg swelling, but denies mouth sores, rashes, fevers, chills, diarrhea, abdominal pains, denies weight loss, CP, and SOB.    MEDICAL HISTORY:  Past Medical History:  Diagnosis Date  . Cancer [facility])    FNA of thyroid positive for onconytic/hurthle cell carcinoma  . Chronic back pain   . DDD (degenerative disc disease), cervical   . DJD (degenerative joint disease)   . History of rectal fissure   . HIT (heparin-induced thrombocytopenia) ([place])   . Hypertension   . Obesity   . 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 ([place])   . Pulmonary embolus, right ([place]) 2015  . Rotator cuff tendonitis right    SURGICAL HISTORY: Past Surgical History:  Procedure Laterality Date  . LAMINECTOMY N/A 12/14/2013   Procedure: THORACIC LAMINECTOMY FOR TUMOR THORACIC THREE;  Surgeon: Ashok Pall, MD;  Location: [place] NEURO ORS;   Service: Neurosurgery;  Laterality: N/A;  THORACIC LAMINECTOMY FOR TUMOR THORACIC THREE  . LAMINECTOMY N/A 07/05/2015   Procedure: THORACIC LAMINECTOMY FOR TUMOR;  Surgeon: Ashok Pall, MD;  Location: [place] NEURO ORS;  Service: Neurosurgery;  Laterality: N/A;  . POSTERIOR LUMBAR FUSION 4 LEVEL N/A 12/30/2013   Procedure: Thoracic one-Thoracic five posterior thoracic fusion with pedicle screws;  Surgeon: Ashok Pall, MD;  Location: [place] NEURO ORS;  Service: Neurosurgery;  Laterality: N/A;  Thoracic one-Thoracic five posterior thoracic fusion with pedicle screws  . THYROIDECTOMY N/A 01/09/2014   Procedure: TOTAL THYROIDECTOMY;  Surgeon: Izora Gala, MD;  Location: [place];  Service: ENT;  Laterality: N/A;  . TONSILLECTOMY      SOCIAL HISTORY: Social History   Socioeconomic History  . Marital status: Single    Spouse name: Not on file  . Number of children: Not on file  . Years of education: Not on file  . Highest education level: Not on file  Social Needs  . Financial resource strain: Not on file  . Food insecurity - worry: Not on file  . Food insecurity - inability: Not on file  . Transportation needs - medical: Not on file  . Transportation needs - non-medical: Not on file  Occupational History  . Not on file  Tobacco Use  . Smoking status: Never  Smoker  . Smokeless tobacco: Never Used  Substance and Sexual Activity  . Alcohol use: No  . Drug use: No  . Sexual activity: Not Currently  Other Topics Concern  . Not on file  Social History Narrative  . Not on file    FAMILY HISTORY: Family History  Problem Relation Age of Onset  . Hypertension Mother   . Diabetes Mother   . Hypertension Father   . Stroke Father   . Diabetes Father   . Diabetes Sister   . Diabetes Sister     ALLERGIES:  is allergic to bee venom; keflex [cephalexin]; tramadol; gabapentin; heparin; hydrochlorothiazide; hydrocodone; and oxycodone-acetaminophen.  MEDICATIONS:  Current Outpatient Medications    Medication Sig Dispense Refill  . baclofen (LIORESAL) 20 MG tablet TAKE (1) TABLET THREE TIMES DAILY. 90 tablet 3  . colchicine 0.6 MG tablet TAKE  (1)  TABLET TWICE A DAY AS NEEDED. (Patient taking differently: Take one tablet by mouth as needed for gout.) 30 tablet 3  . fluconazole (DIFLUCAN) 100 MG tablet Take 1 tablet (100 mg total) by mouth daily. 7 tablet 0  . fluticasone (FLONASE) 50 MCG/ACT nasal spray Place 2 sprays into both nostrils daily. 16 g 6  . furosemide (LASIX) 20 MG tablet Take 1 tablet (20 mg total) by mouth daily as needed. (Patient taking differently: Take 20 mg by mouth daily as needed for fluid. ) 30 tablet 1  . LENVIMA 24 MG DAILY DOSE 10 (2) & 4 MG capsule TAKE 24 MG(2, 10 MG CAPSULES AND 1, 4MG  CAPSULE) BY MOUTH DAILY. 90 capsule 0  . levothyroxine (SYNTHROID, LEVOTHROID) 175 MCG tablet Take 175 mcg by mouth daily before breakfast.    . lisinopril (PRINIVIL,ZESTRIL) 20 MG tablet Take 2 tablets (40 mg total) by mouth daily. 60 tablet 1  . mupirocin ointment (BACTROBAN) 2 % Place 1 application into the nose 2 (two) times daily. And on sores on extremities and face 22 g 0  . NIFEdipine (PROCARDIA XL) 30 MG 24 hr tablet Take 1 tablet (30 mg total) by mouth daily. 30 tablet 1  . ondansetron (ZOFRAN) 4 MG tablet Take 1 tablet (4 mg total) by mouth every 6 (six) hours. 30 tablet 4  . pantoprazole (PROTONIX) 40 MG tablet Take 1 tablet (40 mg total) by mouth daily. 30 tablet 2  . promethazine (PHENERGAN) 25 MG tablet Take 1 tablet (25 mg total) by mouth every 6 (six) hours as needed for nausea or vomiting. 20 tablet 0  . rivaroxaban (XARELTO) 20 MG TABS tablet Take 1 tablet (20 mg total) by mouth daily. 30 tablet 6  . senna (SENOKOT) 8.6 MG TABS tablet Take 2 tablets (17.2 mg total) by mouth daily. 120 each 0  . sertraline (ZOLOFT) 25 MG tablet TAKE 1 TABLET ONCE A DAY 30 tablet 5   No current facility-administered medications for this visit.     REVIEW OF SYSTEMS:    10  Point review of Systems was done is negative except as noted above.  PHYSICAL EXAMINATION:  ECOG PERFORMANCE STATUS: 3 - Symptomatic, >50% confined to bed  . Vitals:   04/13/17 1313  BP: (!) 208/115  Pulse: 89  Resp: 18  Temp: 97.8 F (36.6 C)  SpO2: 100%   Filed Weights   04/13/17 1313  Weight: 241 lb (109.3 kg)   .Body mass index is 33.61 kg/m.  GENERAL:alert, in no acute distress and comfortable, sitting in the wheelchair  SKIN: skin color, texture, turgor are normal,  no rashes or significant lesions EYES: normal, conjunctiva are pink and non-injected, sclera clear OROPHARYNX: grade 1-2 oral mucositis no overt thrush noted NECK: supple, no JVD, thyroid normal size, non-tender, without nodularity LYMPH:  no palpable lymphadenopathy in the cervical, axillary or inguinal LUNGS: clear to auscultation with normal respiratory effort HEART: regular rate & rhythm,  no murmurs, (+) mild lower extremity edema ABDOMEN: abdomen soft, non-tender, normoactive bowel sounds  Musculoskeletal: no cyanosis of digits and no clubbing , left pitting pedal edema chronic 1-2+  PSYCH: alert & oriented x 3 with fluent speech NEURO: Decreased bilateral lower extremity strength    LABORATORY DATA:  I have reviewed the data as listed  . CBC Latest Ref Rng & Units 04/13/2017 01/02/2017 12/22/2016  WBC 3.9 - 10.3 K/uL 3.1(L) 3.0(L) 3.4(L)  Hemoglobin 11.6 - 15.9 g/dL 12.8 12.7 11.9(L)  Hematocrit 34.8 - 46.6 % 38.9 37.4 34.7(L)  Platelets 145 - 400 K/uL 167 153 148(L)   ANC 1.6k . CMP Latest Ref Rng & Units 04/13/2017 01/02/2017 12/22/2016  Glucose 70 - 140 mg/dL 91 87 100(H)  BUN 7 - 26 mg/dL 10 11.9 11  Creatinine 0.60 - 1.10 mg/dL 0.90 0.9 1.03(H)  Sodium 136 - 145 mmol/L 143 143 143  Potassium 3.3 - 4.7 mmol/L 4.1 4.0 4.1  Chloride 98 - 109 mmol/L 104 - 107  CO2 22 - 29 mmol/L 28 25 29   Calcium 8.4 - 10.4 mg/dL 8.8 8.8 8.5(L)  Total Protein 6.4 - 8.3 g/dL 6.5 6.3(L) 6.3(L)  Total  Bilirubin 0.2 - 1.2 mg/dL 0.6 1.06 1.2  Alkaline Phos 40 - 150 U/L 130 126 122  AST 5 - 34 U/L 25 24 22   ALT 0 - 55 U/L 12 12 16          RADIOGRAPHIC STUDIES: I have personally reviewed the radiological images as listed and agreed with the findings in the report.  .No results found.   ASSESSMENT & PLAN:  52 year old female with   1) stage IVC metastatic Hurthle cell thyroid carcinoma with metastases to the spine, lung, superior mediastinal mass and nodal metastases. Improved based on last PET/CT  Patient has had treatment as noted above. Recently had cord compression at T3 level due to recurrent tumor status post partial resection and debulking. Recent MRI on 08/14/2015 showed improved but persistent cord compression at the T3 level. She has been noted not to be a candidate for further repeat surgery at this time or repeat radiation. Recent PET/CT scan is noted of bowel done at [facility] on 08/07/2015 showed evidence of tumor progression and patient was started on lenvatinib as per Dr. Leamon Arnt. Her thyroglobulin level was 11.7 with anti-body of 9.8 on 08/07/2015 at [place]  PET/CT 11/01/2015: shows relatively stable though high burden of disease. Some concern of local recurrence in the thyroidectomy bed which is asymptomatic at this time. MRI thoracic spine 12/25/2015: No new abnormality since the prior examination. Status post T1-5 fusion for T3 decompression. Mass effect on the cord at the T3 level appears improved compared to the prior exam. Postoperative fluid collection likely representing a seroma seen on the prior study has slightly decreased in size.  PET/CT 06/03/2016 with Generally reduced hypermetabolic activity in the previously identified lesions, including the pulmonary nodules, dominant upper thoracic metastatic focus, and left lower cervical adenopathy. Mildly reduced size of previous lesions as well. No new lesions.  PET/CT 12/12/2016: Concern for progression of metastatic Hurthle  cell thyroid carcinoma with significant increase in metabolic activity of several  lymph nodes, pulmonary nodules, and skeletal metastasis. Most convincing element of progression is a significant increase in metabolic activity (more than doubling). Mild increase in size of the lymph nodes.  2) grade 1 oral mucositis related to Lenvatinib -resolved  3) FDG avidity in the rectum - much improved. No significant concerns on last PET  4) abnormal LFTs with borderline elevation of transaminases and increase in alkaline phosphatase. Resolved on labs today Repeat labs today shows stable LFTs  5) Mild Leukopenia without neutropenia ANC 1.8k. Plan  - patient has no clinical evidence of Hurthle cell carcinoma progression at this time but recent PET/CT unfortunately does show evidence of disease progression with significant increase in metabolic activity of several lymph nodes, pulmonary nodules, and skeletal metastasis.despite well suppressed TSH levels. -TSH levels less suppressed today after dose reduction of levothyroxine per endocrinologist. Thyroglobulin levels stable and Ab levels -pending. -Continue Lenvatinib to 24mg  po daily . No prohibitive toxicities. Does have uncontrolled HTN - which will need to be addressed. PET/CT in 3 weeks RTC with Dr Irene Limbo in 4 weeks with labs  6) history of right-sided pulmonary embolism on chronic anticoagulation with Rivaroxaban. Monitor for bleeding. No bleeding reported  7)  HTN.-uncontrolled. No CP/SOB -missed linsinopril dose this AM Plan -continue f/u with PCP and I informed her to begin taking her blood pressures in the mornings for a more accurate reading.  -continue Lisinopril 40mg  po daily -has lasix prescribed by PCP - which she is to start taking today -gave prescription for BM monitor -Discussed HTN management with pt and prescribed 30mg  Procardia XL q day- to be titrated up with PCP   PET/CT in 3 weeks RTC with Dr Irene Limbo in 4 weeks with  labs   All of the patients questions were answered with apparent satisfaction. The patient knows to call the clinic with any problems, questions or concerns.  I spent 20 minutes counseling the patient face to face. The total time spent in the appointment was 25 minutes and more than 50% was on counseling and direct patient cares.    Sullivan Lone MD [place] AAHIVMS [facility] Hematology/Oncology Physician [place]  (Office):       [phone] (Work cell):  [phone] (Fax):           [phone]  This document serves as a record of services personally performed by Sullivan Lone, MD. It was created on his behalf by Baldwin Jamaica, a trained medical scribe. The creation of this record is based on the scribe's personal observations and the provider's statements to them.   .I have reviewed the above documentation for accuracy and completeness, and I agree with the above. Brunetta Genera MD MS

## 2017-04-13 ENCOUNTER — Inpatient Hospital Stay: Payer: Medicare Other | Attending: Hematology

## 2017-04-13 ENCOUNTER — Inpatient Hospital Stay (HOSPITAL_BASED_OUTPATIENT_CLINIC_OR_DEPARTMENT_OTHER): Payer: Medicare Other | Admitting: Hematology

## 2017-04-13 ENCOUNTER — Telehealth: Payer: Self-pay | Admitting: Hematology

## 2017-04-13 VITALS — BP 208/115 | HR 89 | Temp 97.8°F | Resp 18 | Ht 71.0 in | Wt 241.0 lb

## 2017-04-13 DIAGNOSIS — C778 Secondary and unspecified malignant neoplasm of lymph nodes of multiple regions: Secondary | ICD-10-CM

## 2017-04-13 DIAGNOSIS — Z86711 Personal history of pulmonary embolism: Secondary | ICD-10-CM | POA: Insufficient documentation

## 2017-04-13 DIAGNOSIS — C7951 Secondary malignant neoplasm of bone: Secondary | ICD-10-CM

## 2017-04-13 DIAGNOSIS — C7800 Secondary malignant neoplasm of unspecified lung: Secondary | ICD-10-CM | POA: Diagnosis not present

## 2017-04-13 DIAGNOSIS — D72819 Decreased white blood cell count, unspecified: Secondary | ICD-10-CM | POA: Insufficient documentation

## 2017-04-13 DIAGNOSIS — Z7901 Long term (current) use of anticoagulants: Secondary | ICD-10-CM | POA: Insufficient documentation

## 2017-04-13 DIAGNOSIS — I10 Essential (primary) hypertension: Secondary | ICD-10-CM

## 2017-04-13 DIAGNOSIS — C73 Malignant neoplasm of thyroid gland: Secondary | ICD-10-CM

## 2017-04-13 DIAGNOSIS — Z79899 Other long term (current) drug therapy: Secondary | ICD-10-CM | POA: Insufficient documentation

## 2017-04-13 LAB — CBC WITH DIFFERENTIAL/PLATELET
Basophils Absolute: 0 10*3/uL (ref 0.0–0.1)
Basophils Relative: 0 %
EOS ABS: 0.1 10*3/uL (ref 0.0–0.5)
Eosinophils Relative: 2 %
HEMATOCRIT: 38.9 % (ref 34.8–46.6)
HEMOGLOBIN: 12.8 g/dL (ref 11.6–15.9)
LYMPHS ABS: 1 10*3/uL (ref 0.9–3.3)
LYMPHS PCT: 32 %
MCH: 32.2 pg (ref 25.1–34.0)
MCHC: 32.9 g/dL (ref 31.5–36.0)
MCV: 98 fL (ref 79.5–101.0)
MONOS PCT: 8 %
Monocytes Absolute: 0.3 10*3/uL (ref 0.1–0.9)
NEUTROS ABS: 1.8 10*3/uL (ref 1.5–6.5)
NEUTROS PCT: 58 %
Platelets: 167 10*3/uL (ref 145–400)
RBC: 3.97 MIL/uL (ref 3.70–5.45)
RDW: 14.5 % (ref 11.2–16.1)
WBC: 3.1 10*3/uL — AB (ref 3.9–10.3)

## 2017-04-13 LAB — COMPREHENSIVE METABOLIC PANEL
ALT: 12 U/L (ref 0–55)
ANION GAP: 11 (ref 3–11)
AST: 25 U/L (ref 5–34)
Albumin: 3 g/dL — ABNORMAL LOW (ref 3.5–5.0)
Alkaline Phosphatase: 130 U/L (ref 40–150)
BUN: 10 mg/dL (ref 7–26)
CHLORIDE: 104 mmol/L (ref 98–109)
CO2: 28 mmol/L (ref 22–29)
Calcium: 8.8 mg/dL (ref 8.4–10.4)
Creatinine, Ser: 0.9 mg/dL (ref 0.60–1.10)
Glucose, Bld: 91 mg/dL (ref 70–140)
POTASSIUM: 4.1 mmol/L (ref 3.3–4.7)
SODIUM: 143 mmol/L (ref 136–145)
Total Bilirubin: 0.6 mg/dL (ref 0.2–1.2)
Total Protein: 6.5 g/dL (ref 6.4–8.3)

## 2017-04-13 LAB — TSH: TSH: 0.492 u[IU]/mL (ref 0.308–3.960)

## 2017-04-13 MED ORDER — NIFEDIPINE ER OSMOTIC RELEASE 30 MG PO TB24: 30.0000 mg | ORAL_TABLET | Freq: Every day | ORAL | 1 refills | Status: DC

## 2017-04-13 MED FILL — LENVIMA 24 MG DAILY DOSE: 2 X 10 MG & | 30 days supply | Qty: 90 | Fill #0

## 2017-04-13 NOTE — Telephone Encounter (Signed)
Gave patient avs report and appointments for February. Central radiology will call re pet scan.

## 2017-04-14 ENCOUNTER — Encounter: Payer: Medicare Other | Attending: Physical Medicine & Rehabilitation | Admitting: Physical Medicine & Rehabilitation

## 2017-04-14 ENCOUNTER — Other Ambulatory Visit: Payer: Self-pay

## 2017-04-14 ENCOUNTER — Encounter: Payer: Self-pay | Admitting: Physical Medicine & Rehabilitation

## 2017-04-14 VITALS — BP 134/95 | HR 84

## 2017-04-14 DIAGNOSIS — Z9889 Other specified postprocedural states: Secondary | ICD-10-CM | POA: Insufficient documentation

## 2017-04-14 DIAGNOSIS — N319 Neuromuscular dysfunction of bladder, unspecified: Secondary | ICD-10-CM | POA: Diagnosis not present

## 2017-04-14 DIAGNOSIS — G8220 Paraplegia, unspecified: Secondary | ICD-10-CM | POA: Insufficient documentation

## 2017-04-14 DIAGNOSIS — C7951 Secondary malignant neoplasm of bone: Secondary | ICD-10-CM | POA: Insufficient documentation

## 2017-04-14 DIAGNOSIS — C73 Malignant neoplasm of thyroid gland: Secondary | ICD-10-CM | POA: Insufficient documentation

## 2017-04-14 DIAGNOSIS — Z5189 Encounter for other specified aftercare: Secondary | ICD-10-CM | POA: Insufficient documentation

## 2017-04-14 DIAGNOSIS — B379 Candidiasis, unspecified: Secondary | ICD-10-CM | POA: Diagnosis not present

## 2017-04-14 DIAGNOSIS — Z86711 Personal history of pulmonary embolism: Secondary | ICD-10-CM | POA: Insufficient documentation

## 2017-04-14 DIAGNOSIS — M109 Gout, unspecified: Secondary | ICD-10-CM | POA: Diagnosis not present

## 2017-04-14 DIAGNOSIS — M25512 Pain in left shoulder: Secondary | ICD-10-CM | POA: Insufficient documentation

## 2017-04-14 DIAGNOSIS — M25511 Pain in right shoulder: Secondary | ICD-10-CM | POA: Insufficient documentation

## 2017-04-14 LAB — THYROGLOBULIN ANTIBODY: Thyroglobulin Antibody: <1 IU/mL (ref 0.0–0.9)

## 2017-04-14 MED ORDER — RIVAROXABAN 20 MG PO TABS: 20.0000 mg | ORAL_TABLET | Freq: Every day | ORAL | 6 refills | Status: DC

## 2017-04-14 MED ORDER — FLUCONAZOLE 100 MG PO TABS: 100.0000 mg | ORAL_TABLET | Freq: Every day | ORAL | 0 refills | Status: DC

## 2017-04-14 NOTE — Progress Notes (Signed)
Subjective:    Patient ID: Sarah [name], female    DOB: [birth year], 52 y.o.   MRN: [id number]  HPI   Sarah [name] is here in follow up of her thoracic spinal cord injury. She has been battling through an assortment of colds and other minor illnesses which have sidetracked her rehab efforts. She also had a loss in the family.   She is ready to get back to therapy.  She also has had some problems with her wheelchair and some parts that are no longer working for her broken.  From a standpoint of her Hurthle cell carcinoma she is stable.  She has another PET scan scheduled in February 2 follow-up the state of her disease.    From a bowel bladder standpoint there are no changes reported other than some problems with an ongoing yeast infection.  Skin has been intact except for some chronic pockmark like lesions on her hands and wrists.  Diagnosis is unclear.     Pain Inventory Average Pain 2 Pain Right Now 3 My pain is aching  In the last 24 hours, has pain interfered with the following? General activity 0 Relation with others 0 Enjoyment of life 0 What TIME of day is your pain at its worst? night Sleep (in general) Good  Pain is worse with: sitting and inactivity Pain improves with: rest Relief from Meds: 0  Mobility walk with assistance ability to climb steps?  no do you drive?  no use a wheelchair transfers alone Do you have any goals in this area?  yes  Function disabled: date disabled . Do you have any goals in this area?  yes  Neuro/Psych bladder control problems numbness tingling spasms  Prior Studies Any changes since last visit?  no  Physicians involved in your care Any changes since last visit?  no   Family History  Problem Relation Age of Onset  . Hypertension Mother   . Diabetes Mother   . Hypertension Father   . Stroke Father   . Diabetes Father   . Diabetes Sister   . Diabetes Sister    Social History   Socioeconomic History  . Marital  status: Single    Spouse name: None  . Number of children: None  . Years of education: None  . Highest education level: None  Social Needs  . Financial resource strain: None  . Food insecurity - worry: None  . Food insecurity - inability: None  . Transportation needs - medical: None  . Transportation needs - non-medical: None  Occupational History  . None  Tobacco Use  . Smoking status: Never Smoker  . Smokeless tobacco: Never Used  Substance and Sexual Activity  . Alcohol use: No  . Drug use: No  . Sexual activity: Not Currently  Other Topics Concern  . None  Social History Narrative  . None   Past Surgical History:  Procedure Laterality Date  . LAMINECTOMY N/A 12/14/2013   Procedure: THORACIC LAMINECTOMY FOR TUMOR THORACIC THREE;  Surgeon: Ashok Pall, MD;  Location: [place] NEURO ORS;  Service: Neurosurgery;  Laterality: N/A;  THORACIC LAMINECTOMY FOR TUMOR THORACIC THREE  . LAMINECTOMY N/A 07/05/2015   Procedure: THORACIC LAMINECTOMY FOR TUMOR;  Surgeon: Ashok Pall, MD;  Location: [place] NEURO ORS;  Service: Neurosurgery;  Laterality: N/A;  . POSTERIOR LUMBAR FUSION 4 LEVEL N/A 12/30/2013   Procedure: Thoracic one-Thoracic five posterior thoracic fusion with pedicle screws;  Surgeon: Ashok Pall, MD;  Location: [place] NEURO ORS;  Service:  Neurosurgery;  Laterality: N/A;  Thoracic one-Thoracic five posterior thoracic fusion with pedicle screws  . THYROIDECTOMY N/A 01/09/2014   Procedure: TOTAL THYROIDECTOMY;  Surgeon: Izora Gala, MD;  Location: [place];  Service: ENT;  Laterality: N/A;  . TONSILLECTOMY     Past Medical History:  Diagnosis Date  . Cancer [facility])    FNA of thyroid positive for onconytic/hurthle cell carcinoma  . Chronic back pain   . DDD (degenerative disc disease), cervical   . DJD (degenerative joint disease)   . History of rectal fissure   . HIT (heparin-induced thrombocytopenia) ([place])   . Hypertension   . Obesity   . 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 ([place])   . Pulmonary  embolus, right ([place]) 2015  . Rotator cuff tendonitis right   There were no vitals taken for this visit.  Opioid Risk Score:   Fall Risk Score:  `1  Depression screen PHQ 2/9  Depression screen [facility] 2/9 04/14/2017 04/01/2017 01/26/2017 12/29/2016 08/05/2016 02/12/2016 11/13/2015  Decreased Interest 0 0 0 0 0 0 0  Down, Depressed, Hopeless 0 0 0 0 0 0 0  PHQ - 2 Score 0 0 0 0 0 0 0  Altered sleeping - - - - - - -  Tired, decreased energy - - - - - - -  Change in appetite - - - - - - -  Feeling bad or failure about yourself  - - - - - - -  Trouble concentrating - - - - - - -  Moving slowly or fidgety/restless - - - - - - -  Suicidal thoughts - - - - - - -  PHQ-9 Score - - - - - - -  Difficult doing work/chores - - - - - - -  Some recent data might be hidden    Review of Systems  Constitutional: Negative.   HENT: Negative.   Eyes: Negative.   Respiratory: Negative.   Cardiovascular: Positive for leg swelling.  Gastrointestinal: Positive for constipation.  Endocrine: Negative.   Genitourinary: Negative.   Musculoskeletal: Negative.   Skin: Negative.   Allergic/Immunologic: Negative.   Neurological: Negative.   Hematological: Negative.   Psychiatric/Behavioral: Negative.        Objective:   Physical Exam  Constitutional: She appears well-developed. NAD.  Remains obese HENT: NCAT.  Eyes: EOMI Cardiovascular: RRR Respiratory: CTA B.  GI: soft BS +.  Musculoskeletal: shoulders with minimal impingement signs Neurological: She is alert and oriented.   BUE 5/5 proximal to distal.  RLE: HF, KE4-/5, ankle dorsi/plantar flexion  4/5 LLE: 3+/5 to 4/5proximal to distal 4/5.   Extensor tone in lower extremities approximately tr/4 Sensory 1+/2 bilateral LE's.  Skin: Skin is warm and dry.  [name] Kitchen Psychiatric: pleasant and appropriate as always   Assessment & Plan:  1. 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 Needs w/c repair also.  - 2. H/o PE/Anticoagulation: Pharmaceutical: Xarelto, 3. Pain Management: Controlled currently only using Tylenol.  Encourage use of topical modalities as well   4. Metastatic cancer with T3 pathologic FX with cord compression and new osseous mets T12- L3/S1:  -tumor has been stable, PET scan next month              5. Spasticity:  -Continue baclofen 20 mg 3 X day   6. Neurogenic bowel :  -colace q am  7. Neurogenic bladder: emptying bladder   -Ordered her a 7-day course of Diflucan for vaginal yeast infection 8. Gout right first toe: Resolved colchicine prn 9. Bilateral shoulder pain--- generally resolved   15 minutesof face to face patient care time were spent during this visit. All questions were encouraged and answered. Follow up in 4 months.

## 2017-04-14 NOTE — Patient Instructions (Signed)
PLEASE FEEL FREE TO CALL OUR OFFICE WITH ANY PROBLEMS OR QUESTIONS ([phone])      

## 2017-04-16 ENCOUNTER — Other Ambulatory Visit: Payer: Self-pay | Admitting: Hematology

## 2017-04-18 LAB — THYROGLOBULIN LEVEL: THYROGLOBULIN: 34 ng/mL

## 2017-04-20 NOTE — Telephone Encounter (Signed)
Left VM for pt explaining that f/u visit required with [name] before refilling medication. Appt set up for 04/13/17.

## 2017-04-29 DIAGNOSIS — M25512 Pain in left shoulder: Secondary | ICD-10-CM | POA: Diagnosis not present

## 2017-04-29 DIAGNOSIS — M25511 Pain in right shoulder: Secondary | ICD-10-CM | POA: Diagnosis not present

## 2017-05-01 ENCOUNTER — Other Ambulatory Visit: Payer: Self-pay | Admitting: Family Medicine

## 2017-05-04 ENCOUNTER — Other Ambulatory Visit: Payer: Self-pay | Admitting: Family Medicine

## 2017-05-05 ENCOUNTER — Ambulatory Visit (HOSPITAL_COMMUNITY): Payer: Medicare Other

## 2017-05-06 ENCOUNTER — Other Ambulatory Visit: Payer: Self-pay | Admitting: *Deleted

## 2017-05-06 MED ORDER — SERTRALINE HCL 25 MG PO TABS: 25.0000 mg | ORAL_TABLET | Freq: Every day | ORAL | 3 refills | Status: DC

## 2017-05-08 ENCOUNTER — Other Ambulatory Visit: Payer: Self-pay | Admitting: Hematology

## 2017-05-08 ENCOUNTER — Other Ambulatory Visit: Payer: Self-pay

## 2017-05-08 MED ORDER — LENVATINIB (24 MG DAILY DOSE) 2 X 10 MG & 4 MG PO CPPK: ORAL_CAPSULE | ORAL | 0 refills | Status: DC

## 2017-05-08 MED FILL — LENVIMA 24 MG DAILY DOSE: 2 X 10 MG & | 30 days supply | Qty: 90 | Fill #0

## 2017-05-11 ENCOUNTER — Ambulatory Visit: Payer: Medicare Other | Admitting: Hematology

## 2017-05-11 ENCOUNTER — Other Ambulatory Visit: Payer: Medicare Other

## 2017-05-11 DIAGNOSIS — G8220 Paraplegia, unspecified: Secondary | ICD-10-CM | POA: Diagnosis not present

## 2017-05-11 DIAGNOSIS — G839 Paralytic syndrome, unspecified: Secondary | ICD-10-CM | POA: Diagnosis not present

## 2017-05-11 DIAGNOSIS — C73 Malignant neoplasm of thyroid gland: Secondary | ICD-10-CM | POA: Diagnosis not present

## 2017-05-12 ENCOUNTER — Ambulatory Visit: Payer: Medicare Other | Admitting: Physical Therapy

## 2017-05-15 ENCOUNTER — Telehealth: Payer: Self-pay | Admitting: Hematology

## 2017-05-15 NOTE — Telephone Encounter (Signed)
Patient called to reschedule  °

## 2017-05-18 DIAGNOSIS — C73 Malignant neoplasm of thyroid gland: Secondary | ICD-10-CM | POA: Diagnosis not present

## 2017-05-19 ENCOUNTER — Ambulatory Visit (HOSPITAL_COMMUNITY): Payer: Medicare Other

## 2017-05-20 ENCOUNTER — Encounter (HOSPITAL_COMMUNITY)
Admission: RE | Admit: 2017-05-20 | Discharge: 2017-05-20 | Disposition: A | Discharge status: Home / Self Care | Payer: Medicare Other | Source: Ambulatory Visit | Attending: Hematology | Admitting: Hematology

## 2017-05-20 ENCOUNTER — Encounter (HOSPITAL_COMMUNITY): Payer: Self-pay | Admitting: Radiology

## 2017-05-20 DIAGNOSIS — C73 Malignant neoplasm of thyroid gland: Secondary | ICD-10-CM | POA: Diagnosis not present

## 2017-05-20 LAB — GLUCOSE, CAPILLARY: Glucose-Capillary: 104 mg/dL — ABNORMAL HIGH (ref 65–99)

## 2017-05-20 MED ORDER — FLUDEOXYGLUCOSE F - 18 (FDG) INJECTION
12.3900 | Freq: Once | INTRAVENOUS | Status: AC | PRN
  Administered 2017-05-20: 12.39 via INTRAVENOUS

## 2017-05-26 ENCOUNTER — Encounter: Payer: Self-pay | Admitting: Physical Therapy

## 2017-05-26 ENCOUNTER — Ambulatory Visit: Payer: Medicare Other | Attending: Physical Medicine & Rehabilitation | Admitting: Physical Therapy

## 2017-05-26 ENCOUNTER — Other Ambulatory Visit: Payer: Self-pay

## 2017-05-26 DIAGNOSIS — R2689 Other abnormalities of gait and mobility: Secondary | ICD-10-CM | POA: Diagnosis not present

## 2017-05-26 DIAGNOSIS — M6281 Muscle weakness (generalized): Secondary | ICD-10-CM | POA: Diagnosis not present

## 2017-05-26 DIAGNOSIS — R29818 Other symptoms and signs involving the nervous system: Secondary | ICD-10-CM | POA: Insufficient documentation

## 2017-05-26 DIAGNOSIS — G8222 Paraplegia, incomplete: Secondary | ICD-10-CM | POA: Insufficient documentation

## 2017-05-27 ENCOUNTER — Other Ambulatory Visit: Payer: Self-pay

## 2017-05-27 NOTE — Therapy (Addendum)
[facility] [address] [place], [place], [address] Phone: [phone]   Fax:  [phone]  Physical Therapy Evaluation  Patient Details  Name: Sarah [name] MRN: [id number] Date of Birth: 09-02-1965 Referring Provider: Dr. Alger Simons   Encounter Date: 05/26/2017  PT End of Session - 05/27/17 2147    Visit Number  1    Number of Visits  17    Date for PT Re-Evaluation  07/25/17    Authorization Type  [employer] Medicare    Authorization Time Period  05-26-17 - 07-25-17    PT Start Time  6387    PT Stop Time  1402    PT Time Calculation (min)  45 min       Past Medical History:  Diagnosis Date  . 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 Chronic back pain   . DDD (degenerative disc disease), cervical   . DJD (degenerative joint disease)   . History of rectal fissure   . HIT (heparin-induced thrombocytopenia) ([place])   . Hypertension   . Obesity   . 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 ([place])   . Pulmonary embolus, right ([place]) 2015  . Rotator cuff tendonitis right    Past Surgical History:  Procedure Laterality Date  . LAMINECTOMY N/A 12/14/2013   Procedure: THORACIC LAMINECTOMY FOR TUMOR THORACIC THREE;  Surgeon: Ashok Pall, MD;  Location: [facility];  Service: Neurosurgery;  Laterality: N/A;  THORACIC LAMINECTOMY FOR TUMOR THORACIC THREE  . LAMINECTOMY N/A 07/05/2015   Procedure: THORACIC LAMINECTOMY FOR TUMOR;  Surgeon: Ashok Pall, MD;  Location: [facility];  Service: Neurosurgery;  Laterality: N/A;  . POSTERIOR LUMBAR FUSION 4 LEVEL N/A 12/30/2013   Procedure: Thoracic one-Thoracic five posterior thoracic fusion with pedicle screws;  Surgeon: Ashok Pall, MD;  Location: [facility];  Service: Neurosurgery;  Laterality: N/A;  Thoracic one-Thoracic five posterior thoracic fusion with pedicle screws  . THYROIDECTOMY N/A 01/09/2014   Procedure: TOTAL THYROIDECTOMY;  Surgeon: Izora Gala, MD;  Location: [facility];  Service: ENT;  Laterality: N/A;  . TONSILLECTOMY      There were no vitals filed for this visit.   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 Alger Simons    Onset Date/Surgical Date  12/12/13 2nd sx 07-05-15    Prior Therapy  pt has received PT at this facility      Precautions   Precautions  Fall    Precaution Comments  nonambulatory      Balance Screen   Has the patient fallen in the past 6 months  No    Has the patient had a decrease in activity level because of a fear of falling?   No    Is the patient reluctant to leave their home because of a fear of falling?   No      Home Environment  Living Environment  Private residence    Type of [place]  One level      Prior Function   Level of Independence  Independent with basic ADLs;Independent with homemaking with ambulation    Vocation  On disability    Leisure  enjoys going to beach      ROM / Strength   AROM / PROM / Strength  Strength      PROM   Overall PROM   Within functional limits for tasks performed      Strength   Strength Assessment Site  Hip;Knee;Ankle    Right/Left Hip  Right;Left    Right Hip Flexion  3+/5    Left Hip Flexion  3+/5    Right Knee Extension  4+/5    Left Knee Flexion  3+/5    Left Knee Extension  4/5    Right/Left Ankle  Right;Left    Right Ankle Plantar Flexion  3+/5    Left Ankle Dorsiflexion  4-/5    Left Ankle Plantar Flexion  3-/5      Transfers    Transfers  Sit to Stand    Sit to Stand  4: Min assist from high/low mat table    Number of Reps  Other reps (comment) 2      Ambulation/Gait   Ambulation/Gait  Yes    Ambulation/Gait Assistance  4: Min assist +2 for safety - 2nd person present but not assisting    Ambulation/Gait Assistance Details  cues to stand erect    Ambulation Distance (Feet)  11 Feet 8', 3'    Assistive device  Rolling walker    Gait Pattern  Step-through pattern    Ambulation Surface  Level;Indoor             Objective measurements completed on examination: See above findings.                PT Short Term Goals - 05/28/17 1645      PT SHORT TERM GOAL #1   Title  Perform sit to stand transfer from wheelchair to RW with +1 min assist.    Baseline  +2 people present for safety - mod assist needed    Time  4    Period  Weeks    Status  New    Target Date  06/26/17      PT SHORT TERM GOAL #2   Title  Amb. with RW 25' with +2 min assist for incr. household accessibility.    Baseline  62' with RW with +2 min to mod assist    Time  4    Period  Weeks    Status  New    Target Date  06/26/17      PT SHORT TERM GOAL #3   Title  Independent in HEP for LE strengthening and stretching.                            Time  4    Period  Weeks    Status  New    Target Date  06/26/17      PT SHORT TERM GOAL #4   Title  Pt will stand at counter/ sink for 3" with bil. UE support with +1 min assist.    Baseline  TBA    Time  4    Period  Weeks  Status  New    Target Date  06/26/17      PT SHORT TERM GOAL #5   Title  Incr. endurance/activity tolerance so pt. able to perform 10' nonstop on recumbent bike ([place])                   Time  4    Period  Weeks    Status  New    Target Date  06/26/17        PT Long Term Goals - 05/28/17 1649      PT LONG TERM GOAL #1   Title  Pt will transfer sit to stand from wheelchair to RW with CGA.    Time  8    Period  Weeks    Status  New     Target Date  07/25/17      PT LONG TERM GOAL #2   Title  Stand at countertop modified independently for at least 5" per pt report to demo increased LE strength and improved standing tolerance    Time  8    Period  Weeks    Status  New    Target Date  07/25/17      PT LONG TERM GOAL #5   Title  Pt will tolerate rolling prone and maintaining position for at least 10 mins for stretching hip flexors.      Time  8    Period  Weeks    Status  New    Target Date  07/25/17      PT LONG TERM GOAL #6   Title  Pt will amb. 30' at home with RW with min assist +1    Time  8    Period  Weeks    Status  New    Target Date  07/25/17             Plan - 05/28/17 1640    Clinical Impression Statement  Pt is a 52 yr old lady s/p metastatic tumor to spine in August 2015.  Pt presents with paraparesis with spasticity and with c/o back spasms that occur during standing/walking which cause pain, requiring pt to sit down to help alleviate.  Pt is unable to stand unsupported and is dependent for sit to stand transfers.      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 2015; metastases April 2017 requiring another surgery    Clinical Presentation  Evolving    Clinical Presentation due to:  metastatic tumor to spine    Clinical Decision Making  Moderate    Rehab Potential  Good    Clinical Impairments Affecting Rehab Potential  severity of impairments and length of time since onset    PT Frequency  2x / week    PT Duration  8 weeks    PT Treatment/Interventions  ADLs/Self Care Home Management;Therapeutic exercise;Balance training;Therapeutic activities;Functional mobility training;Gait training;Neuromuscular re-education;Patient/family education;DME Instruction;Wheelchair mobility training;Passive range of motion    PT Next Visit Plan  Gait training:  stretches in prone    PT Home Exercise Plan  stretches and LE strengthening, continue to work on sit/stand  transfers, gait with RW. standing balance activities    Consulted and Agree with Plan of Care  Patient       Patient will benefit from skilled therapeutic intervention in order to improve the following deficits and impairments:  Abnormal gait, Decreased activity tolerance, Decreased balance, Decreased coordination, Decreased range of motion,  Decreased mobility, Decreased endurance, Decreased strength, Impaired flexibility, Increased muscle spasms, Pain, Impaired tone  Visit Diagnosis: Other abnormalities of gait and mobility - Plan: PT plan of care cert/re-cert  Muscle weakness (generalized) - Plan: PT plan of care cert/re-cert  Paraplegia, incomplete ([place]) - Plan: PT plan of care cert/re-cert  Other symptoms and signs involving the nervous system - Plan: PT plan of care cert/re-cert     Problem List Patient Active Problem List   Diagnosis Date Noted  . Nasal congestion 04/05/2017  . Breast pain 01/26/2017  . Upper respiratory infection, viral 01/26/2017  . Counseling regarding advanced care planning and goals of care 01/02/2017  . Bilateral rotator cuff syndrome 02/12/2016  . E. coli UTI 08/01/2015  . Constipation due to neurogenic bowel 08/01/2015  . Muscle spasticity   . 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 Spastic paraparesis   . Neurogenic bladder   . History of pulmonary embolism   . Post-operative pain   . Metastatic cancer ([place])   . Steroid-induced hyperglycemia   . Depression   . Obstipation   . Thoracic spine tumor 07/06/2015  . 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 Cord compression ([place])   . Postoperative hypothyroidism   . Secondary malignant neoplasm of vertebral column ([place]) 03/08/2015  . Spasticity 09/19/2014  . Dysuria 09/04/2014  . Hypertension 05/01/2014  . Ingrown right big toenail 05/01/2014  . GERD (gastroesophageal reflux disease) 02/14/2014  . Dysphagia, pharyngoesophageal phase 02/06/2014  . Type 2 diabetes mellitus  without complication ([place]) 01/60/1093  . Constipation   . Neurogenic bowel   . 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 Metastatic cancer to spine ([place]) 01/19/2014  . Rotator cuff tendonitis   . 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 Leg weakness, bilateral 12/13/2013    ATFTDD, UKGUR [id number], PT 05/28/2017, 4:56 PM  [facility] [address] [place], [place], [address] Phone: [phone]   Fax:  [phone]  Name: MALEYAH EVANS MRN: [id number] Date of Birth: 1965/05/20

## 2017-05-27 NOTE — Progress Notes (Signed)
[name] Kitchen    HEMATOLOGY/ONCOLOGY CLINIC NOTE  Date of Service: 05/28/17   Patient Care Team: [name], MD as PCP - General (Family Medicine) [name], MD as Consulting Physician (Physical Medicine and Rehabilitation) Ashok Pall, MD as Consulting Physician (Neurosurgery) Brunetta Genera, MD as Consulting Physician (Hematology) Jacelyn Pi, MD as Consulting Physician (Endocrinology)   Dayton Bailiff MD (Spine [place]) [phone]. 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  01/09/2014- patient underwent total thyroidectomy by Dr. Elie Goody. Pathology showed Hurthle cell carcinoma with no perineural invasion, presence of lymphovascular invasion and extra thyroidal extension. p T3, p N0, Mx  Received 30 grays in 10 fractions to her C7-T6 lesion finished in November 2015  Received 135 mCi of iodine 131 on 07/21/2014. Whole-body scan was apparently negative.  07/05/2015-07/10/2015 admitted to [facility] for leg weakness. MRI of the T-spine showed recurrent tumor causing spinal cord compression. Patient was emergently taken to the or and underwent a redo thoracic laminectomy of T3 and partial tumor resection.   08/07/2015 PET/CT scan of the chest abdomen pelvis showed slight enlargement of FDG avid left level III and level IV lymph nodes concerning for worsening nodal metastases. Slight enlargement of the hypermetabolic right superior mediastinal mass and multiple metabolically active pulmonary nodules representing worsening metastases. T3 vertebral body and bilateral pedicle metastases status  post posture metallic fusion. No residual uptake in the thyroidectomy bed  Thyroglobulin level was 11.7 (thyroglobulin antibody present 9.8)   HISTORY OF PRESENTING ILLNESS:   Sarah [name] is a wonderful 52 y.o. female who has been referred to [place] by Dr Leamon Arnt (medical oncology at [facility]) for help with local comanagement of the patient's metastatic Hurthle cell thyroid carcinoma with details as noted above. Patient had recent hospitalization from 4/13/7 to 07/10/2015 at [facility] for lower extremity weakness and was noted to have cord compression from recurrent tumor at T3. She is status post redo thoracic laminectomy of T3 and partial tumor resection to try to decompress the core. Pathology was consistent with metastatic Hurthle cell carcinoma.  She has been on dexamethasone 2 mg by mouth twice a day. Continue to have lower extremity weakness and is currently in a skilled nursing facility getting rehabilitation. She feels her lower extremity weakness is possibly getting somewhat better.  Patient had a repeat PET/CT scan at [facility] on 08/07/2015 as noted above which shows progression of her metastatic disease including nodal metastases , pulmonary nodules and right superior mediastinal mass.  MRI of the spine on 08/14/2015 showed improved but persistent cord compression at T3 level.  She was started by Dr. Leamon Arnt on Lenvatinib and 14 mg by mouth daily for progressive metastatic disease after ruling out the option of repeat surgery with Dr. Cyndy Freeze and reradiation with Dr. Ledon Snare.  After a week or so the patient was increased to a dose of 18mg  po daily. She notes no diarrhea or other gastrointestinal symptoms. No skin rashes.  Is noted to have oral thrush and was started on nystatin swish and swallow.   Patient has a history of pulmonary  embolism 2015 and has continued to be on Xarelto for anticoagulation.   INTERVAL HISTORY  KAYSIE MICHELINI returns today for a  follow up regarding her Metastatic Hurthle cell thyroid carcinoma. The patient's last visit with [place] was on 04/13/17. She is accompanied today by her sister. The pt reports that she is doing well overall.   She notes that she is tolerating Lenvatinib well. She reports that over the last year she has lost 40 lbs, and that at that time her leg swelling was significant. She notes that she has begun going to PT again and is using a walker around the house.  Of note since the patient's last visit, pt has had PET Skull base to thigh completed on 05/20/17 with results revealing Partial metabolic response. Left supraclavicular and right paratracheal nodal metastases, bilateral pulmonary metastases, and osseous metastases involving the upper thoracic spine and right sternum, with mild decrease in metabolism, as described above.  Lab results today (05/28/17) of CBC, CMP, and Reticulocytes is as follows: all values are WNL except for CO2 at 30 and Total Bilirubin at 1.4.  On review of systems, pt denies mouth soreness, back pains, and any other symptoms.    MEDICAL HISTORY:  Past Medical History:  Diagnosis Date  . Cancer [facility])    FNA of thyroid positive for onconytic/hurthle cell carcinoma  . Chronic back pain   . DDD (degenerative disc disease), cervical   . DJD (degenerative joint disease)   . History of rectal fissure   . HIT (heparin-induced thrombocytopenia) ([place])   . Hypertension   . Obesity   . 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 ([place])   . Pulmonary embolus, right ([place]) 2015  . Rotator cuff tendonitis right    SURGICAL HISTORY: Past Surgical History:  Procedure Laterality Date  . LAMINECTOMY N/A 12/14/2013   Procedure: THORACIC LAMINECTOMY FOR TUMOR THORACIC THREE;  Surgeon: Ashok Pall, MD;  Location: [place] NEURO ORS;  Service: Neurosurgery;  Laterality: N/A;  THORACIC LAMINECTOMY FOR TUMOR THORACIC THREE  . LAMINECTOMY N/A 07/05/2015   Procedure: THORACIC LAMINECTOMY FOR TUMOR;  Surgeon: Ashok Pall,  MD;  Location: [place] NEURO ORS;  Service: Neurosurgery;  Laterality: N/A;  . POSTERIOR LUMBAR FUSION 4 LEVEL N/A 12/30/2013   Procedure: Thoracic one-Thoracic five posterior thoracic fusion with pedicle screws;  Surgeon: Ashok Pall, MD;  Location: [place] NEURO ORS;  Service: Neurosurgery;  Laterality: N/A;  Thoracic one-Thoracic five posterior thoracic fusion with pedicle screws  . THYROIDECTOMY N/A 01/09/2014   Procedure: TOTAL THYROIDECTOMY;  Surgeon: Izora Gala, MD;  Location: [place];  Service: ENT;  Laterality: N/A;  . TONSILLECTOMY      SOCIAL HISTORY: Social History   Socioeconomic History  . Marital status: Single    Spouse name: Not on file  . Number of children: Not on file  . Years of education: Not on file  . Highest education level: Not on file  Social Needs  . Financial resource strain: Not on file  . Food insecurity - worry: Not on file  . Food insecurity - inability: Not on file  . Transportation needs - medical: Not on file  . Transportation needs - non-medical: Not on file  Occupational History  . Not on file  Tobacco Use  . Smoking status: Never Smoker  . Smokeless tobacco: Never Used  Substance and Sexual Activity  . Alcohol use: No  . Drug use: No  . Sexual activity: Not Currently  Other Topics Concern  . Not on file  Social  History Narrative  . Not on file    FAMILY HISTORY: Family History  Problem Relation Age of Onset  . Hypertension Mother   . Diabetes Mother   . Hypertension Father   . Stroke Father   . Diabetes Father   . Diabetes Sister   . Diabetes Sister     ALLERGIES:  is allergic to bee venom; keflex [cephalexin]; tramadol; gabapentin; heparin; hydrochlorothiazide; hydrocodone; and oxycodone-acetaminophen.  MEDICATIONS:  Current Outpatient Medications  Medication Sig Dispense Refill  . baclofen (LIORESAL) 20 MG tablet TAKE (1) TABLET THREE TIMES DAILY. 90 tablet 3  . colchicine 0.6 MG tablet TAKE  (1)  TABLET TWICE A DAY AS NEEDED.  (Patient taking differently: Take one tablet by mouth as needed for gout.) 30 tablet 3  . fluconazole (DIFLUCAN) 100 MG tablet Take 1 tablet (100 mg total) by mouth daily. 7 tablet 0  . fluticasone (FLONASE) 50 MCG/ACT nasal spray Place 2 sprays into both nostrils daily. 16 g 6  . furosemide (LASIX) 20 MG tablet Take 1 tablet (20 mg total) by mouth daily as needed. (Patient taking differently: Take 20 mg by mouth daily as needed for fluid. ) 30 tablet 1  . lenvatinib 24 mg daily dose (LENVIMA 24 MG DAILY DOSE) 10 (2) & 4 MG capsule TAKE 24 MG(2, 10 MG CAPSULES AND 1, 4MG  CAPSULE) BY MOUTH DAILY. 90 capsule 0  . levothyroxine (SYNTHROID, LEVOTHROID) 175 MCG tablet Take 150 mcg by mouth daily before breakfast.     . lisinopril (PRINIVIL,ZESTRIL) 20 MG tablet Take 2 tablets (40 mg total) by mouth daily. 60 tablet 1  . mupirocin ointment (BACTROBAN) 2 % Place 1 application into the nose 2 (two) times daily. And on sores on extremities and face 22 g 0  . NIFEdipine (PROCARDIA XL) 30 MG 24 hr tablet Take 1 tablet (30 mg total) by mouth daily. 30 tablet 1  . ondansetron (ZOFRAN) 4 MG tablet Take 1 tablet (4 mg total) by mouth every 6 (six) hours. 30 tablet 4  . pantoprazole (PROTONIX) 40 MG tablet Take 1 tablet (40 mg total) by mouth daily. 30 tablet 2  . promethazine (PHENERGAN) 25 MG tablet Take 1 tablet (25 mg total) by mouth every 6 (six) hours as needed for nausea or vomiting. 20 tablet 0  . rivaroxaban (XARELTO) 20 MG TABS tablet Take 1 tablet (20 mg total) by mouth daily. 30 tablet 6  . senna (SENOKOT) 8.6 MG TABS tablet Take 2 tablets (17.2 mg total) by mouth daily. 120 each 0  . sertraline (ZOLOFT) 25 MG tablet Take 1 tablet (25 mg total) by mouth daily. 30 tablet 3   No current facility-administered medications for this visit.     REVIEW OF SYSTEMS:    .10 Point review of Systems was done is negative except as noted above.   PHYSICAL EXAMINATION:  ECOG PERFORMANCE STATUS: 3 - Symptomatic,  >50% confined to bed  . Vitals:   05/28/17 1431  BP: (!) 141/83  Pulse: 82  Resp: 18  Temp: (!) 97.5 F (36.4 C)  SpO2: 100%   Filed Weights   05/28/17 1431  Weight: 208 lb (94.3 kg)   .Body mass index is 29.01 kg/m.  [name] Kitchen GENERAL:alert, in no acute distress and comfortable SKIN: no acute rashes, no significant lesions EYES: conjunctiva are pink and non-injected, sclera anicteric OROPHARYNX: MMM, no exudates, no oropharyngeal erythema or ulceration NECK: supple, no JVD LYMPH:  no palpable lymphadenopathy in the cervical, axillary or inguinal regions  LUNGS: clear to auscultation b/l with normal respiratory effort HEART: regular rate & rhythm ABDOMEN:  normoactive bowel sounds , non tender, not distended. Extremity: trace pedal edema PSYCH: alert & oriented x 3 with fluent speech NEURO: alxox 3    LABORATORY DATA:  I have reviewed the data as listed  . CBC Latest Ref Rng & Units 05/28/2017 04/13/2017 01/02/2017  WBC 3.9 - 10.3 K/uL 4.1 3.1(L) 3.0(L)  Hemoglobin 11.6 - 15.9 g/dL - 12.8 12.7  Hematocrit 34.8 - 46.6 % 41.6 38.9 37.4  Platelets 145 - 400 K/uL 164 167 153   ANC 2.3k . CMP Latest Ref Rng & Units 04/13/2017 01/02/2017 12/22/2016  Glucose 70 - 140 mg/dL 91 87 100(H)  BUN 7 - 26 mg/dL 10 11.9 11  Creatinine 0.60 - 1.10 mg/dL 0.90 0.9 1.03(H)  Sodium 136 - 145 mmol/L 143 143 143  Potassium 3.3 - 4.7 mmol/L 4.1 4.0 4.1  Chloride 98 - 109 mmol/L 104 - 107  CO2 22 - 29 mmol/L 28 25 29   Calcium 8.4 - 10.4 mg/dL 8.8 8.8 8.5(L)  Total Protein 6.4 - 8.3 g/dL 6.5 6.3(L) 6.3(L)  Total Bilirubin 0.2 - 1.2 mg/dL 0.6 1.06 1.2  Alkaline Phos 40 - 150 U/L 130 126 122  AST 5 - 34 U/L 25 24 22   ALT 0 - 55 U/L 12 12 16          RADIOGRAPHIC STUDIES: I have personally reviewed the radiological images as listed and agreed with the findings in the report.  .Nm Pet Image Restag (ps) Skull Base To Thigh  Result Date: 05/20/2017 CLINICAL DATA:  Subsequent treatment  strategy for metastatic Hurthle cell thyroid cancer. EXAM: NUCLEAR MEDICINE PET SKULL BASE TO THIGH TECHNIQUE: 12.39 mCi F-18 FDG was injected intravenously. Full-ring PET imaging was performed from the skull base to thigh after the radiotracer. CT data was obtained and used for attenuation correction and anatomic localization. Fasting blood glucose: 100 for mg/dl Mediastinal blood pool activity: SUV max 3.6 COMPARISON:  12/11/2016 FINDINGS: NECK: Left supraclavicular nodes measuring up to 11 mm short axis (series 4/image 34), max SUV 72.6, previously 69.2. Incidental CT findings: none CHEST: Scattered small bilateral pulmonary nodules measuring up to 5 mm, max SUV 10.7 in the left upper lobe (previously 5.4) and max SUV 4.0 at the medial left lobe base. 15 mm short axis right paratracheal node (series 4/image 53), max SUV 17.8, previously 32.6. Mild metabolism involving the upper/mid esophagus, without associated mass on CT, likely physiologic. Incidental CT findings: none ABDOMEN/PELVIS: No hypermetabolic lymphadenopathy in the abdomen/pelvis. No abnormal hypermetabolism in the liver, spleen, pancreas, or bilateral adrenal glands. Incidental CT findings: none SKELETON: Hypermetabolism involving the T3 vertebral body, at the site of prior surgical fixation, max SUV 15.8, previously 70. Hypermetabolism involving the right sternum, max SUV 25.8, previously 33. Incidental CT findings: none IMPRESSION: Partial metabolic response. Left supraclavicular and right paratracheal nodal metastases, bilateral pulmonary metastases, and osseous metastases involving the upper thoracic spine and right sternum, with mild decrease in metabolism, as described above. Electronically Signed   By: Julian Hy M.D.   On: 05/20/2017 14:01     ASSESSMENT & PLAN:  52 year old female with   1) Stage IVC metastatic Hurthle cell thyroid carcinoma with metastases to the spine, lung, superior mediastinal mass and nodal  metastases. Improved based on last PET/CT  Patient has had treatment as noted above. Recently had cord compression at T3 level due to recurrent tumor status post partial resection and debulking.  Recent MRI on 08/14/2015 showed improved but persistent cord compression at the T3 level. She has been noted not to be a candidate for further repeat surgery at this time or repeat radiation. Recent PET/CT scan is noted of bowel done at [facility] on 08/07/2015 showed evidence of tumor progression and patient was started on lenvatinib as per Dr. Leamon Arnt. Her thyroglobulin level was 11.7 with anti-body of 9.8 on 08/07/2015 at [facility]  PET/CT 11/01/2015: shows relatively stable though high burden of disease. Some concern of local recurrence in the thyroidectomy bed which is asymptomatic at this time. MRI thoracic spine 12/25/2015: No new abnormality since the prior examination. Status post T1-5 fusion for T3 decompression. Mass effect on the cord at the T3 level appears improved compared to the prior exam. Postoperative fluid collection likely representing a seroma seen on the prior study has slightly decreased in size.  PET/CT 06/03/2016 with Generally reduced hypermetabolic activity in the previously identified lesions, including the pulmonary nodules, dominant upper thoracic metastatic focus, and left lower cervical adenopathy. Mildly reduced size of previous lesions as well. No new lesions.  PET/CT 12/12/2016: Concern for progression of metastatic Hurthle cell thyroid carcinoma with significant increase in metabolic activity of several lymph nodes, pulmonary nodules, and skeletal metastasis. Most convincing element of progression is a significant increase in metabolic activity (more than doubling). Mild increase in size of the lymph nodes.  PET/CT  07/15/51: Partial metabolic response. Left supraclavicular and right paratracheal nodal metastases, bilateral pulmonary metastases, and osseous metastases involving the upper  thoracic spine and right sternum, with mild decrease in metabolism, as described above.   Plan: -Discussed pt labwork today; liver and kidney numbers are stable, and blood counts are all WNL.  -Discussed last PET on 05/20/17 revealing a partial metabolic response which is quite significant in her current clinical situation. -She was therapeutically over-replaced on thyroid replacement for TSH suppression which likely explains a good bit of her weight loss.  2) grade 1 oral mucositis related to Lenvatinib -resolved  3) FDG avidity in the rectum - much improved. No significant concerns on last PET  4) abnormal LFTs with borderline elevation of transaminases and increase in alkaline phosphatase. - Now resolved.  5) Mild Leukopenia without neutropenia ANC 1.8k. Plan  - patient has no clinical evidence of Hurthle cell carcinoma progression at this time, infact she has had a good partial response to increased dose of the lenvatinib -TSH levels less suppressed today after dose reduction of levothyroxine per endocrinologist. Recommended she discussed this with Dr Chalmers Cater to determine need for levothyroxine dose adjustment for TSH suppression. Thyroglobulin levels stable and Ab levels -pending. -Continue Lenvatinib to 24mg  po daily . No prohibitive toxicities. BP better controlled today  6) history of right-sided pulmonary embolism on chronic anticoagulation with Rivaroxaban. Monitor for bleeding. No bleeding reported  7)  HTN.-previously uncontrolled. Better controlled today No CP/SOB Plan -continue f/u with PCP and I informed her to begin taking her blood pressures in the mornings for a more accurate reading.    RTC with Dr Irene Limbo in 2 months with labs   All of the patients questions were answered with apparent satisfaction. The patient knows to call the clinic with any problems, questions or concerns.  . The total time spent in the appointment was 25 minutes and more than 50% was on  counseling and direct patient cares.      Sullivan Lone MD [place] AAHIVMS [facility] Hematology/Oncology Physician [place]  (Office):  [phone] (Work cell):  [phone] (Fax):           [phone]  This document serves as a record of services personally performed by Sullivan Lone, MD. It was created on his behalf by Baldwin Jamaica, a trained medical scribe. The creation of this record is based on the scribe's personal observations and the provider's statements to them.   .I have reviewed the above documentation for accuracy and completeness, and I agree with the above. Brunetta Genera MD MS

## 2017-05-28 ENCOUNTER — Telehealth: Payer: Self-pay | Admitting: Hematology

## 2017-05-28 ENCOUNTER — Inpatient Hospital Stay: Payer: Medicare Other | Attending: Hematology

## 2017-05-28 ENCOUNTER — Inpatient Hospital Stay (HOSPITAL_BASED_OUTPATIENT_CLINIC_OR_DEPARTMENT_OTHER): Payer: Medicare Other | Admitting: Hematology

## 2017-05-28 VITALS — BP 141/83 | HR 82 | Temp 97.5°F | Resp 18 | Ht 71.0 in | Wt 208.0 lb

## 2017-05-28 DIAGNOSIS — D72819 Decreased white blood cell count, unspecified: Secondary | ICD-10-CM | POA: Insufficient documentation

## 2017-05-28 DIAGNOSIS — C73 Malignant neoplasm of thyroid gland: Secondary | ICD-10-CM | POA: Diagnosis not present

## 2017-05-28 DIAGNOSIS — C7951 Secondary malignant neoplasm of bone: Secondary | ICD-10-CM | POA: Diagnosis not present

## 2017-05-28 DIAGNOSIS — Z86711 Personal history of pulmonary embolism: Secondary | ICD-10-CM | POA: Insufficient documentation

## 2017-05-28 DIAGNOSIS — Z7901 Long term (current) use of anticoagulants: Secondary | ICD-10-CM

## 2017-05-28 DIAGNOSIS — C7800 Secondary malignant neoplasm of unspecified lung: Secondary | ICD-10-CM

## 2017-05-28 DIAGNOSIS — C778 Secondary and unspecified malignant neoplasm of lymph nodes of multiple regions: Secondary | ICD-10-CM | POA: Diagnosis not present

## 2017-05-28 DIAGNOSIS — I10 Essential (primary) hypertension: Secondary | ICD-10-CM | POA: Insufficient documentation

## 2017-05-28 LAB — CMP (CANCER CENTER ONLY)
ALBUMIN: 3.6 g/dL (ref 3.5–5.0)
ALK PHOS: 139 U/L (ref 40–150)
ALT: 18 U/L (ref 0–55)
AST: 24 U/L (ref 5–34)
Anion gap: 8 (ref 3–11)
BILIRUBIN TOTAL: 1.4 mg/dL — AB (ref 0.2–1.2)
BUN: 9 mg/dL (ref 7–26)
CALCIUM: 9.3 mg/dL (ref 8.4–10.4)
CO2: 30 mmol/L — ABNORMAL HIGH (ref 22–29)
Chloride: 102 mmol/L (ref 98–109)
Creatinine: 0.83 mg/dL (ref 0.60–1.10)
GFR, Est AFR Am: >60 mL/min (ref >60)
GFR, Estimated: >60 mL/min (ref >60)
GLUCOSE: 102 mg/dL (ref 70–140)
POTASSIUM: 4.4 mmol/L (ref 3.5–5.1)
Sodium: 140 mmol/L (ref 136–145)
TOTAL PROTEIN: 6.8 g/dL (ref 6.4–8.3)

## 2017-05-28 LAB — CBC WITH DIFFERENTIAL (CANCER CENTER ONLY)
BASOS ABS: 0 10*3/uL (ref 0.0–0.1)
BASOS PCT: 0 %
EOS ABS: 0.1 10*3/uL (ref 0.0–0.5)
EOS PCT: 1 %
HCT: 41.6 % (ref 34.8–46.6)
Hemoglobin: 13.8 g/dL (ref 11.6–15.9)
LYMPHS PCT: 34 %
Lymphs Abs: 1.4 10*3/uL (ref 0.9–3.3)
MCH: 31.8 pg (ref 25.1–34.0)
MCHC: 33.2 g/dL (ref 31.5–36.0)
MCV: 95.9 fL (ref 79.5–101.0)
MONO ABS: 0.4 10*3/uL (ref 0.1–0.9)
Monocytes Relative: 9 %
Neutro Abs: 2.3 10*3/uL (ref 1.5–6.5)
Neutrophils Relative %: 56 %
PLATELETS: 164 10*3/uL (ref 145–400)
RBC: 4.34 MIL/uL (ref 3.70–5.45)
RDW: 13.9 % (ref 11.2–14.5)
WBC: 4.1 10*3/uL (ref 3.9–10.3)

## 2017-05-28 LAB — RETICULOCYTES
RBC.: 4.34 MIL/uL (ref 3.70–5.45)
RETIC COUNT ABSOLUTE: 52.1 10*3/uL (ref 33.7–90.7)
RETIC CT PCT: 1.2 % (ref 0.7–2.1)

## 2017-05-28 NOTE — Telephone Encounter (Signed)
Gave patient calendar with appts per 3/7 los.

## 2017-05-28 NOTE — Addendum Note (Signed)
Addended by: Lamar Benes on: 05/28/2017 04:57 PM   Modules accepted: Orders

## 2017-06-03 ENCOUNTER — Other Ambulatory Visit: Payer: Self-pay | Admitting: Hematology

## 2017-06-03 ENCOUNTER — Other Ambulatory Visit: Payer: Self-pay

## 2017-06-03 MED ORDER — LENVATINIB (24 MG DAILY DOSE) 2 X 10 MG & 4 MG PO CPPK: ORAL_CAPSULE | ORAL | 0 refills | Status: DC

## 2017-06-08 ENCOUNTER — Ambulatory Visit: Payer: Medicare Other | Admitting: Physical Therapy

## 2017-06-08 DIAGNOSIS — M6281 Muscle weakness (generalized): Secondary | ICD-10-CM

## 2017-06-08 DIAGNOSIS — R2689 Other abnormalities of gait and mobility: Secondary | ICD-10-CM | POA: Diagnosis not present

## 2017-06-08 DIAGNOSIS — R29818 Other symptoms and signs involving the nervous system: Secondary | ICD-10-CM

## 2017-06-08 DIAGNOSIS — G8222 Paraplegia, incomplete: Secondary | ICD-10-CM | POA: Diagnosis not present

## 2017-06-08 MED FILL — LENVIMA 24 MG DAILY DOSE: 2 X 10 MG & | 30 days supply | Qty: 90 | Fill #0

## 2017-06-09 ENCOUNTER — Encounter: Payer: Self-pay | Admitting: Physical Therapy

## 2017-06-09 NOTE — Therapy (Addendum)
[facility] [address] [place], [place], [address] Phone: [phone]   Fax:  [phone]  Physical Therapy Treatment  Patient Details  Name: Sarah [name] MRN: [id number] Date of Birth: September 30, 1965 Referring Provider: Dr. Alger Simons   Encounter Date: 06/08/2017  PT End of Session - 06/09/17 2151    Visit Number  2    Number of Visits  17    Date for PT Re-Evaluation  07/25/17    Authorization Type  [employer] Medicare    Authorization Time Period  05-26-17 - 07-25-17    PT Start Time  1451    PT Stop Time  1534    PT Time Calculation (min)  43 min    Equipment Utilized During Treatment  Gait belt       Past Medical History:  Diagnosis Date  . 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 Chronic back pain   . DDD (degenerative disc disease), cervical   . DJD (degenerative joint disease)   . History of rectal fissure   . HIT (heparin-induced thrombocytopenia) ([place])   . Hypertension   . Obesity   . 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 ([place])   . Pulmonary embolus, right ([place]) 2015  . Rotator cuff tendonitis right    Past Surgical History:  Procedure Laterality Date  . LAMINECTOMY N/A 12/14/2013   Procedure: THORACIC LAMINECTOMY FOR TUMOR THORACIC THREE;  Surgeon: Ashok Pall, MD;  Location: [facility];  Service: Neurosurgery;  Laterality: N/A;  THORACIC LAMINECTOMY FOR TUMOR THORACIC THREE  . LAMINECTOMY N/A 07/05/2015   Procedure: THORACIC LAMINECTOMY FOR TUMOR;  Surgeon: Ashok Pall, MD;  Location: [facility];  Service: Neurosurgery;  Laterality: N/A;  . POSTERIOR LUMBAR FUSION 4 LEVEL N/A 12/30/2013   Procedure: Thoracic one-Thoracic five posterior thoracic fusion with pedicle screws;  Surgeon: Ashok Pall, MD;  Location: [facility];  Service: Neurosurgery;  Laterality: N/A;  Thoracic one-Thoracic five posterior thoracic fusion with pedicle screws  . THYROIDECTOMY N/A 01/09/2014   Procedure: TOTAL THYROIDECTOMY;  Surgeon: Izora Gala, MD;  Location: [facility];  Service: ENT;  Laterality: N/A;  . TONSILLECTOMY      There were no vitals filed for this visit.  Subjective Assessment - 06/09/17 1710    Subjective  Pt states her back has been hurting - continues to stand and walk at home    Pertinent History  thoracic laminectomy for spinal tumor 12-14-13; 07-05-15 for 2nd sx due to tumor regrowth:  DDD; Rt RTC tendonitis; DDD and DJD:  chronic back pain    Patient Stated Goals  get legs stronger and to walk    Currently in Pain?  Yes    Pain Score  6     Pain Location  Back    Pain Orientation  Right;Left    Pain Descriptors / Indicators  Aching;Spasm;Discomfort;Tightness    Pain Type  Chronic pain    Pain Onset  More than a month ago    Pain Frequency  Constant                      [facility] Adult PT Treatment/Exercise - 06/10/17 0001      Transfers   Transfers  Sit to Stand    Sit to Stand  3: Mod assist;4: Min assist    Number of Reps  -- 3      Ambulation/Gait   Ambulation/Gait  Yes  Ambulation/Gait Assistance  4: Min assist    Ambulation Distance (Feet)  38 Feet 18    Assistive device  Rolling walker    Gait Pattern  Step-through pattern    Ambulation Surface  Level;Indoor      Pt transferred from wheelchair to mat with SBA: to supine with CGA ; pt able to roll prone with mod assist for positioning Rt shoulder; pt lifted up onto bil. Forearms 3 reps with 5-7 sec hold for back stretching Pt able to roll back into supine position modified independently Performed trunk rotation to each side 2 reps for 10 sec hold  Bridging x 10 reps (very difficult for pt to perform)         PT Short Term Goals - 05/28/17 1645      PT SHORT TERM GOAL #1   Title  Perform sit to stand transfer from wheelchair to RW with +1 min assist.    Baseline  +2 people present for safety - mod assist needed    Time  4    Period  Weeks    Status  New    Target  Date  06/26/17      PT SHORT TERM GOAL #2   Title  Amb. with RW 25' with +2 min assist for incr. household accessibility.    Baseline  38' with RW with +2 min to mod assist    Time  4    Period  Weeks    Status  New    Target Date  06/26/17      PT SHORT TERM GOAL #3   Title  Independent in HEP for LE strengthening and stretching.                            Time  4    Period  Weeks    Status  New    Target Date  06/26/17      PT SHORT TERM GOAL #4   Title  Pt will stand at counter/ sink for 3" with bil. UE support with +1 min assist.    Baseline  TBA    Time  4    Period  Weeks    Status  New    Target Date  06/26/17      PT SHORT TERM GOAL #5   Title  Incr. endurance/activity tolerance so pt. able to perform 10' nonstop on recumbent bike (Scifit)                   Time  4    Period  Weeks    Status  New    Target Date  06/26/17        PT Long Term Goals - 05/28/17 1649      PT LONG TERM GOAL #1   Title  Pt will transfer sit to stand from wheelchair to RW with CGA.    Time  8    Period  Weeks    Status  New    Target Date  07/25/17      PT LONG TERM GOAL #2   Title  Stand at countertop modified independently for at least 5" per pt report to demo increased LE strength and improved standing tolerance    Time  8    Period  Weeks    Status  New    Target Date  07/25/17      PT LONG TERM GOAL #5  Title  Pt will tolerate rolling prone and maintaining position for at least 10 mins for stretching hip flexors.      Time  8    Period  Weeks    Status  New    Target Date  07/25/17      PT LONG TERM GOAL #6   Title  Pt will amb. 30' at home with RW with min assist +1    Time  8    Period  Weeks    Status  New    Target Date  07/25/17            Plan - 06/10/17 1615    Clinical Impression Statement  Pt's ambulation distance of 37' during Pt session on 06-08-17 is much improved from 9' during initial eval; back stretches performed on mat prior to gait  training appear to reduce back spasms during gait, enabling pt to amb. further distance.  pt also improved with stand to sit transfer with 90 degree turn, with 180 degree turn much more difficult.     Rehab Potential  Good    Clinical Impairments Affecting Rehab Potential  severity of impairments and length of time since onset    PT Frequency  2x / week    PT Duration  8 weeks    PT Treatment/Interventions  ADLs/Self Care Home Management;Therapeutic exercise;Balance training;Therapeutic activities;Functional mobility training;Gait training;Neuromuscular re-education;Patient/family education;DME Instruction;Wheelchair mobility training;Passive range of motion    PT Next Visit Plan  Gait training:  stretches in prone    PT Home Exercise Plan  stretches and LE strengthening, continue to work on sit/stand transfers, gait with RW. standing balance activities    Consulted and Agree with Plan of Care  Patient       Patient will benefit from skilled therapeutic intervention in order to improve the following deficits and impairments:  Abnormal gait, Decreased activity tolerance, Decreased balance, Decreased coordination, Decreased range of motion, Decreased mobility, Decreased endurance, Decreased strength, Impaired flexibility, Increased muscle spasms, Pain, Impaired tone  Visit Diagnosis: Other abnormalities of gait and mobility  Muscle weakness (generalized)  Other symptoms and signs involving the nervous system     Problem List Patient Active Problem List   Diagnosis Date Noted  . Nasal congestion 04/05/2017  . Breast pain 01/26/2017  . Upper respiratory infection, viral 01/26/2017  . Counseling regarding advanced care planning and goals of care 01/02/2017  . Bilateral rotator cuff syndrome 02/12/2016  . E. coli UTI 08/01/2015  . Constipation due to neurogenic bowel 08/01/2015  . Muscle spasticity   . 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 Spastic paraparesis   . Neurogenic bladder   .  History of pulmonary embolism   . Post-operative pain   . Metastatic cancer ([place])   . Steroid-induced hyperglycemia   . Depression   . Obstipation   . Thoracic spine tumor 07/06/2015  . 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 Cord compression ([place])   . Postoperative hypothyroidism   . Secondary malignant neoplasm of vertebral column ([place]) 03/08/2015  . Spasticity 09/19/2014  . Dysuria 09/04/2014  . Hypertension 05/01/2014  . Ingrown right big toenail 05/01/2014  . GERD (gastroesophageal reflux disease) 02/14/2014  . Dysphagia, pharyngoesophageal phase 02/06/2014  . Type 2 diabetes mellitus without complication ([place]) 49/44/9675  . Constipation   . Neurogenic bowel   . 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 ([place]) 01/20/2014  . Metastatic cancer to spine ([place]) 01/19/2014  . Rotator cuff tendonitis   . Hurthle cell  neoplasm of thyroid 12/30/2013  . 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 Leg weakness, bilateral 12/13/2013    DildayJenness Corner, PT 06/10/2017, 4:18 PM  [facility] [address] [place], [place], [address] Phone: [phone]   Fax:  [phone]  Name: Sarah [name] MRN: [id number] Date of Birth: December 13, 1965

## 2017-06-16 ENCOUNTER — Ambulatory Visit: Payer: Medicare Other | Admitting: Physical Therapy

## 2017-06-16 DIAGNOSIS — R2689 Other abnormalities of gait and mobility: Secondary | ICD-10-CM

## 2017-06-16 DIAGNOSIS — G8222 Paraplegia, incomplete: Secondary | ICD-10-CM | POA: Diagnosis not present

## 2017-06-16 DIAGNOSIS — M6281 Muscle weakness (generalized): Secondary | ICD-10-CM | POA: Diagnosis not present

## 2017-06-16 DIAGNOSIS — R29818 Other symptoms and signs involving the nervous system: Secondary | ICD-10-CM | POA: Diagnosis not present

## 2017-06-17 ENCOUNTER — Encounter: Payer: Self-pay | Admitting: Physical Therapy

## 2017-06-17 NOTE — Therapy (Addendum)
[facility] [address] [place], [place], [address] Phone: [phone]   Fax:  [phone]  Physical Therapy Treatment  Patient Details  Name: Sarah [name] MRN: [id number] Date of Birth: October 02, 1965 Referring Provider: Dr. Alger Simons   Encounter Date: 06/16/2017    Past Medical History:  Diagnosis Date  . 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 Chronic back pain   . DDD (degenerative disc disease), cervical   . DJD (degenerative joint disease)   . History of rectal fissure   . HIT (heparin-induced thrombocytopenia) ([place])   . Hypertension   . Obesity   . 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 Pulmonary embolus, right ([place]) 2015  . Rotator cuff tendonitis right    Past Surgical History:  Procedure Laterality Date  . LAMINECTOMY N/A 12/14/2013   Procedure: THORACIC LAMINECTOMY FOR TUMOR THORACIC THREE;  Surgeon: Ashok Pall, MD;  Location: [facility];  Service: Neurosurgery;  Laterality: N/A;  THORACIC LAMINECTOMY FOR TUMOR THORACIC THREE  . LAMINECTOMY N/A 07/05/2015   Procedure: THORACIC LAMINECTOMY FOR TUMOR;  Surgeon: Ashok Pall, MD;  Location: [facility];  Service: Neurosurgery;  Laterality: N/A;  . POSTERIOR LUMBAR FUSION 4 LEVEL N/A 12/30/2013   Procedure: Thoracic one-Thoracic five posterior thoracic fusion with pedicle screws;  Surgeon: Ashok Pall, MD;  Location: [facility]S;  Service: Neurosurgery;  Laterality: N/A;  Thoracic one-Thoracic five posterior thoracic fusion with pedicle screws  . THYROIDECTOMY N/A 01/09/2014   Procedure: TOTAL THYROIDECTOMY;  Surgeon: Izora Gala, MD;  Location: [facility];  Service: ENT;  Laterality: N/A;  . TONSILLECTOMY      There were no vitals filed for this visit.                    [facility] Adult PT Treatment/Exercise - 06/21/17 0001      Transfers   Transfers  Sit to Stand    Sit to Stand  3: Mod assist;4:  Min assist    Number of Reps  Other reps (comment) 4      Ambulation/Gait   Ambulation/Gait  Yes    Ambulation/Gait Assistance  4: Min assist    Ambulation/Gait Assistance Details  cues to stand erect    Ambulation Distance (Feet)  23 Feet 33.6    Assistive device  Rolling walker    Gait Pattern  Step-through pattern    Ambulation Surface  Level;Indoor               PT Short Term Goals - 05/28/17 1645      PT SHORT TERM GOAL #1   Title  Perform sit to stand transfer from wheelchair to RW with +1 min assist.    Baseline  +2 people present for safety - mod assist needed    Time  4    Period  Weeks    Status  New    Target Date  06/26/17      PT SHORT TERM GOAL #2   Title  Amb. with RW 25' with +2 min assist for incr. household accessibility.    Baseline  66' with RW with +2 min to mod assist    Time  4    Period  Weeks    Status  New    Target Date  06/26/17      PT SHORT TERM GOAL #3   Title  Independent in HEP for LE strengthening and  stretching.                            Time  4    Period  Weeks    Status  New    Target Date  06/26/17      PT SHORT TERM GOAL #4   Title  Pt will stand at counter/ sink for 3" with bil. UE support with +1 min assist.    Baseline  TBA    Time  4    Period  Weeks    Status  New    Target Date  06/26/17      PT SHORT TERM GOAL #5   Title  Incr. endurance/activity tolerance so pt. able to perform 10' nonstop on recumbent bike (Scifit)                   Time  4    Period  Weeks    Status  New    Target Date  06/26/17        PT Long Term Goals - 05/28/17 1649      PT LONG TERM GOAL #1   Title  Pt will transfer sit to stand from wheelchair to RW with CGA.    Time  8    Period  Weeks    Status  New    Target Date  07/25/17      PT LONG TERM GOAL #2   Title  Stand at countertop modified independently for at least 5" per pt report to demo increased LE strength and improved standing tolerance    Time  8    Period   Weeks    Status  New    Target Date  07/25/17      PT LONG TERM GOAL #5   Title  Pt will tolerate rolling prone and maintaining position for at least 10 mins for stretching hip flexors.      Time  8    Period  Weeks    Status  New    Target Date  07/25/17      PT LONG TERM GOAL #6   Title  Pt will amb. 30' at home with RW with min assist +1    Time  8    Period  Weeks    Status  New    Target Date  07/25/17              Patient will benefit from skilled therapeutic intervention in order to improve the following deficits and impairments:     Visit Diagnosis: Other abnormalities of gait and mobility     Problem List Patient Active Problem List   Diagnosis Date Noted  . Nasal congestion 04/05/2017  . Breast pain 01/26/2017  . Upper respiratory infection, viral 01/26/2017  . Counseling regarding advanced care planning and goals of care 01/02/2017  . Bilateral rotator cuff syndrome 02/12/2016  . E. coli UTI 08/01/2015  . Constipation due to neurogenic bowel 08/01/2015  . Muscle spasticity   . 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 Spastic paraparesis   . Neurogenic bladder   . History of pulmonary embolism   . Post-operative pain   . Metastatic cancer ([place])   . Steroid-induced hyperglycemia   . Depression   . Obstipation   . Thoracic spine tumor 07/06/2015  . 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 Cord compression ([place])   . Postoperative hypothyroidism   .  Secondary malignant neoplasm of vertebral column ([place]) 03/08/2015  . Spasticity 09/19/2014  . Dysuria 09/04/2014  . Hypertension 05/01/2014  . Ingrown right big toenail 05/01/2014  . GERD (gastroesophageal reflux disease) 02/14/2014  . Dysphagia, pharyngoesophageal phase 02/06/2014  . Type 2 diabetes mellitus without complication ([place]) 83/41/9622  . Constipation   . Neurogenic bowel   . 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 ([place]) 01/20/2014  . Metastatic cancer to spine ([place]) 01/19/2014  .  Rotator cuff tendonitis   . 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 Leg weakness, bilateral 12/13/2013    WLNLGX, QJJHE RDEYCXK, PT 06/21/2017, 2:49 PM  [facility] [address] [place], [place], [address] Phone: [phone]   Fax:  [phone]  Name: Sarah [name] MRN: [id number] Date of Birth: 04/07/1965

## 2017-06-20 ENCOUNTER — Other Ambulatory Visit: Payer: Self-pay | Admitting: Physical Medicine & Rehabilitation

## 2017-06-20 DIAGNOSIS — R1314 Dysphagia, pharyngoesophageal phase: Secondary | ICD-10-CM

## 2017-06-23 ENCOUNTER — Ambulatory Visit: Payer: Medicare Other | Attending: Physical Medicine & Rehabilitation | Admitting: Physical Therapy

## 2017-06-23 ENCOUNTER — Other Ambulatory Visit: Payer: Self-pay | Admitting: Physical Medicine & Rehabilitation

## 2017-06-23 DIAGNOSIS — G8222 Paraplegia, incomplete: Secondary | ICD-10-CM | POA: Diagnosis not present

## 2017-06-23 DIAGNOSIS — R29818 Other symptoms and signs involving the nervous system: Secondary | ICD-10-CM | POA: Diagnosis not present

## 2017-06-23 DIAGNOSIS — M6281 Muscle weakness (generalized): Secondary | ICD-10-CM | POA: Diagnosis not present

## 2017-06-23 DIAGNOSIS — R2689 Other abnormalities of gait and mobility: Secondary | ICD-10-CM | POA: Insufficient documentation

## 2017-06-24 ENCOUNTER — Encounter: Payer: Self-pay | Admitting: Physical Therapy

## 2017-06-24 NOTE — Therapy (Signed)
[facility] [address] [place], [place], [address] Phone: [phone]   Fax:  [phone]  Physical Therapy Treatment  Patient Details  Name: Sarah [name] MRN: [id number] Date of Birth: Dec 02, 1965 Referring Provider: Dr. Alger Simons   Encounter Date: 06/23/2017  PT End of Session - 06/24/17 1411    Visit Number  4    Number of Visits  17    Date for PT Re-Evaluation  07/25/17    Authorization Type  [employer]HC Medicare    Authorization Time Period  05-26-17 - 07-25-17    PT Start Time  1105    PT Stop Time  1149    PT Time Calculation (min)  44 min    Equipment Utilized During Treatment  Gait belt       Past Medical History:  Diagnosis Date  . 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 Chronic back pain   . DDD (degenerative disc disease), cervical   . DJD (degenerative joint disease)   . History of rectal fissure   . HIT (heparin-induced thrombocytopenia) ([place])   . Hypertension   . Obesity   . 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 Pulmonary embolus, right ([place]) 2015  . Rotator cuff tendonitis right    Past Surgical History:  Procedure Laterality Date  . LAMINECTOMY N/A 12/14/2013   Procedure: THORACIC LAMINECTOMY FOR TUMOR THORACIC THREE;  Surgeon: Ashok Pall, MD;  Location: [facility];  Service: Neurosurgery;  Laterality: N/A;  THORACIC LAMINECTOMY FOR TUMOR THORACIC THREE  . LAMINECTOMY N/A 07/05/2015   Procedure: THORACIC LAMINECTOMY FOR TUMOR;  Surgeon: Ashok Pall, MD;  Location: [facility];  Service: Neurosurgery;  Laterality: N/A;  . POSTERIOR LUMBAR FUSION 4 LEVEL N/A 12/30/2013   Procedure: Thoracic one-Thoracic five posterior thoracic fusion with pedicle screws;  Surgeon: Ashok Pall, MD;  Location: [facility];  Service: Neurosurgery;  Laterality: N/A;  Thoracic one-Thoracic five posterior thoracic fusion with pedicle screws  . THYROIDECTOMY N/A 01/09/2014   Procedure: TOTAL THYROIDECTOMY;  Surgeon: Izora Gala, MD;  Location: [facility];  Service: ENT;  Laterality: N/A;  . TONSILLECTOMY      There were no vitals filed for this visit.  Subjective Assessment - 06/24/17 1406    Subjective  Pt reports she is having back spasms today    Pertinent History  thoracic laminectomy for spinal tumor 12-14-13; 07-05-15 for 2nd sx due to tumor regrowth:  DDD; Rt RTC tendonitis; DDD and DJD:  chronic back pain    Patient Stated Goals  get legs stronger and to walk    Currently in Pain?  Other (Comment) intermittent back spasms                       [facility] Adult PT Treatment/Exercise - 06/24/17 0001      Bed Mobility   Bed Mobility  Left Sidelying to Sit    Left Sidelying to Sit  5: Supervision      Transfers   Transfers  Squat Pivot Transfers    Sit to Stand  5: Supervision    Number of Reps  Other reps (comment) 2    Comments  wheelchair to/from mat      Ambulation/Gait   Ambulation/Gait Assistance  4: Min assist    Ambulation/Gait Assistance Details  cues to stand erect    Ambulation Distance (Feet)  41.1 Feet 34    Assistive  device  Rolling walker    Gait Pattern  Step-through pattern    Ambulation Surface  Level;Indoor      Knee/Hip Exercises: Prone   Hip Extension  AAROM;Right;Left;1 set;10 reps with knee flexed at 90 degrees (RLE and LLE)      Pt transferred sit to stand from wheelchair to RW to turn and stand against counter to facilitate erect standing and more upright  Posture; pt able to stand with 1 hand on counter and 1 on RW with min assist; progressed to bil. Hands on counter with  Min to  mod assist due to fear with this acitvity  Passive quad stretching 5 reps each leg - pt in prone position  Pt able to roll supine to prone with SBA;  Lift up onto forearms for stretching back - held to tolerance - 5 reps  Pt able to roll back into supine with SBA and then Lt sidelying to sitting with SBA only - no assist needed to  transfer to sitting position         PT Short Term Goals - 05/28/17 1645      PT SHORT TERM GOAL #1   Title  Perform sit to stand transfer from wheelchair to RW with +1 min assist.    Baseline  +2 people present for safety - mod assist needed    Time  4    Period  Weeks    Status  New    Target Date  06/26/17      PT SHORT TERM GOAL #2   Title  Amb. with RW 25' with +2 min assist for incr. household accessibility.    Baseline  34' with RW with +2 min to mod assist    Time  4    Period  Weeks    Status  New    Target Date  06/26/17      PT SHORT TERM GOAL #3   Title  Independent in HEP for LE strengthening and stretching.                            Time  4    Period  Weeks    Status  New    Target Date  06/26/17      PT SHORT TERM GOAL #4   Title  Pt will stand at counter/ sink for 3" with bil. UE support with +1 min assist.    Baseline  TBA    Time  4    Period  Weeks    Status  New    Target Date  06/26/17      PT SHORT TERM GOAL #5   Title  Incr. endurance/activity tolerance so pt. able to perform 10' nonstop on recumbent bike (Scifit)                   Time  4    Period  Weeks    Status  New    Target Date  06/26/17        PT Long Term Goals - 05/28/17 1649      PT LONG TERM GOAL #1   Title  Pt will transfer sit to stand from wheelchair to RW with CGA.    Time  8    Period  Weeks    Status  New    Target Date  07/25/17      PT LONG TERM GOAL #2   Title  Stand at countertop modified independently for at least 5" per pt report to demo increased LE strength and improved standing tolerance    Time  8    Period  Weeks    Status  New    Target Date  07/25/17      PT LONG TERM GOAL #5   Title  Pt will tolerate rolling prone and maintaining position for at least 10 mins for stretching hip flexors.      Time  8    Period  Weeks    Status  New    Target Date  07/25/17      PT LONG TERM GOAL #6   Title  Pt will amb. 30' at home with RW with min  assist +1    Time  8    Period  Weeks    Status  New    Target Date  07/25/17            Plan - 06/24/17 1412    Clinical Impression Statement  Pt amb. furthest distance to date during today's PT session ( 41.10') with RW:  pt able to stand against counter with 1 hand on counter and 1 hand on RW, progressing to both hands on counter with min to mod assist due to incr. fear with this activity    Rehab Potential  Good    Clinical Impairments Affecting Rehab Potential  severity of impairments and length of time since onset    PT Frequency  2x / week    PT Duration  8 weeks    PT Treatment/Interventions  ADLs/Self Care Home Management;Therapeutic exercise;Balance training;Therapeutic activities;Functional mobility training;Gait training;Neuromuscular re-education;Patient/family education;DME Instruction;Wheelchair mobility training;Passive range of motion    PT Next Visit Plan  Gait training:  stretches in prone    PT Home Exercise Plan  stretches and LE strengthening, continue to work on sit/stand transfers, gait with RW. standing balance activities    Consulted and Agree with Plan of Care  Patient       Patient will benefit from skilled therapeutic intervention in order to improve the following deficits and impairments:  Abnormal gait, Decreased activity tolerance, Decreased balance, Decreased coordination, Decreased range of motion, Decreased mobility, Decreased endurance, Decreased strength, Impaired flexibility, Increased muscle spasms, Pain, Impaired tone  Visit Diagnosis: Other abnormalities of gait and mobility  Muscle weakness (generalized)  Other symptoms and signs involving the nervous system     Problem List Patient Active Problem List   Diagnosis Date Noted  . Nasal congestion 04/05/2017  . Breast pain 01/26/2017  . Upper respiratory infection, viral 01/26/2017  . Counseling regarding advanced care planning and goals of care 01/02/2017  . Bilateral rotator cuff  syndrome 02/12/2016  . E. coli UTI 08/01/2015  . Constipation due to neurogenic bowel 08/01/2015  . Muscle spasticity   . 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 Spastic paraparesis   . Neurogenic bladder   . History of pulmonary embolism   . Post-operative pain   . Metastatic cancer ([place])   . Steroid-induced hyperglycemia   . Depression   . Obstipation   . Thoracic spine tumor 07/06/2015  . 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 Cord compression ([place])   . Postoperative hypothyroidism   . 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 Spasticity 09/19/2014  . Dysuria 09/04/2014  . Hypertension 05/01/2014  . Ingrown right big toenail 05/01/2014  . GERD (gastroesophageal reflux disease) 02/14/2014  . Dysphagia, pharyngoesophageal phase  02/06/2014  . Type 2 diabetes mellitus without complication ([place]) 01/24/1593  . Constipation   . Neurogenic bowel   . 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 ([place]) 01/20/2014  . Metastatic cancer to spine ([place]) 01/19/2014  . Rotator cuff tendonitis   . 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 Leg weakness, bilateral 12/13/2013    DildayJenness Corner, PT 06/24/2017, 2:16 PM  [facility] [address] [place], [place], [address] Phone: [phone]   Fax:  [phone]  Name: SHANTA HARTNER MRN: [id number] Date of Birth: [birth year]

## 2017-06-30 ENCOUNTER — Ambulatory Visit: Payer: Medicare Other | Admitting: Physical Therapy

## 2017-06-30 DIAGNOSIS — R29818 Other symptoms and signs involving the nervous system: Secondary | ICD-10-CM | POA: Diagnosis not present

## 2017-06-30 DIAGNOSIS — G8222 Paraplegia, incomplete: Secondary | ICD-10-CM | POA: Diagnosis not present

## 2017-06-30 DIAGNOSIS — R2689 Other abnormalities of gait and mobility: Secondary | ICD-10-CM

## 2017-06-30 DIAGNOSIS — M6281 Muscle weakness (generalized): Secondary | ICD-10-CM

## 2017-07-01 ENCOUNTER — Other Ambulatory Visit: Payer: Self-pay | Admitting: Hematology

## 2017-07-01 ENCOUNTER — Encounter: Payer: Self-pay | Admitting: Physical Therapy

## 2017-07-01 NOTE — Therapy (Signed)
[facility] [address] [place], [place], [address] Phone: [phone]   Fax:  [phone]  Physical Therapy Treatment  Patient Details  Name: Sarah [name] MRN: [id number] Date of Birth: [birth year] Referring Provider: Dr. Alger Simons   Encounter Date: 06/30/2017  PT End of Session - 07/01/17 0759    Visit Number  5    Number of Visits  17    Date for PT Re-Evaluation  07/25/17    Authorization Type  U[employer] Medicare    Authorization Time Period  05-26-17 - 07-25-17    PT Start Time  1105    PT Stop Time  1147    PT Time Calculation (min)  42 min       Past Medical History:  Diagnosis Date  . 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 Chronic back pain   . DDD (degenerative disc disease), cervical   . DJD (degenerative joint disease)   . History of rectal fissure   . HIT (heparin-induced thrombocytopenia) ([place])   . Hypertension   . Obesity   . 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 ([place])   . Pulmonary embolus, right ([place]) 2015  . Rotator cuff tendonitis right    Past Surgical History:  Procedure Laterality Date  . LAMINECTOMY N/A 12/14/2013   Procedure: THORACIC LAMINECTOMY FOR TUMOR THORACIC THREE;  Surgeon: Ashok Pall, MD;  Location: [facility];  Service: Neurosurgery;  Laterality: N/A;  THORACIC LAMINECTOMY FOR TUMOR THORACIC THREE  . LAMINECTOMY N/A 07/05/2015   Procedure: THORACIC LAMINECTOMY FOR TUMOR;  Surgeon: Ashok Pall, MD;  Location: [facility];  Service: Neurosurgery;  Laterality: N/A;  . POSTERIOR LUMBAR FUSION 4 LEVEL N/A 12/30/2013   Procedure: Thoracic one-Thoracic five posterior thoracic fusion with pedicle screws;  Surgeon: Ashok Pall, MD;  Location: [place];  Service: Neurosurgery;  Laterality: N/A;  Thoracic one-Thoracic five posterior thoracic fusion with pedicle screws  . THYROIDECTOMY N/A 01/09/2014   Procedure: TOTAL THYROIDECTOMY;  Surgeon: Izora Gala,  MD;  Location: [facility];  Service: ENT;  Laterality: N/A;  . TONSILLECTOMY      There were no vitals filed for this visit.  Subjective Assessment - 07/01/17 0752    Subjective  Pt inquires about getting a smaller wheelchair (manual) and a rolling walker; states she has been standing at counter at home and stepping in place - has not been walking any at home- states they could not find the RW that they thought was there     Pertinent History  thoracic laminectomy for spinal tumor 12-14-13; 07-05-15 for 2nd sx due to tumor regrowth:  DDD; Rt RTC tendonitis; DDD and DJD:  chronic back pain    Patient Stated Goals  get legs stronger and to walk    Currently in Pain?  No/denies                       [facility] Adult PT Treatment/Exercise - 07/01/17 0001      Transfers   Transfers  Sit to Stand    Sit to Stand  4: Min assist +1 person only    Number of Reps  Other reps (comment) 3    Comments  wheelchair to RW; RW stabilized by PT; cues to bring hips forward and shoulders back upon initial standing       Ambulation/Gait   Ambulation/Gait  Yes    Ambulation/Gait Assistance  4: Min assist  Ambulation/Gait Assistance Details  cues to stand erect    Ambulation Distance (Feet)  29 Feet 33', 45' = 107' total distance    Assistive device  Rolling walker    Gait Pattern  Step-through pattern    Ambulation Surface  Level;Indoor      Pt requires mod assist for stand to sit transfer - has difficulty reaching back with Rt hand due to fear of falling         PT Short Term Goals - 07/01/17 0800      PT SHORT TERM GOAL #1   Title  Perform sit to stand transfer from wheelchair to RW with +1 min assist.    Baseline  +1 ony on 06-30-17    Status  Achieved      PT SHORT TERM GOAL #2   Title  Amb. with RW 25' with +2 min assist for incr. household accessibility.    Baseline  met on 06-30-17 ; furthest distance to date 53' in 1 rep    Status  Achieved      PT SHORT TERM GOAL #3   Title   Independent in HEP for LE strengthening and stretching.                            Status  Achieved      PT SHORT TERM GOAL #4   Title  Pt will stand at counter/ sink for 3" with bil. UE support with +1 min assist.    Baseline  pt reports standing at home - Not tested on 06-30-17 due to time constraint    Status  On-going    Target Date  07/26/17      PT SHORT TERM GOAL #5   Title  Incr. endurance/activity tolerance so pt. able to perform 10' nonstop on recumbent bike (Scifit)                   Baseline  Not tested due to time constraint - 06-30-17    Status  On-going    Target Date  07/26/17        PT Long Term Goals - 05/28/17 1649      PT LONG TERM GOAL #1   Title  Pt will transfer sit to stand from wheelchair to RW with CGA.    Time  8    Period  Weeks    Status  New    Target Date  07/25/17      PT LONG TERM GOAL #2   Title  Stand at countertop modified independently for at least 5" per pt report to demo increased LE strength and improved standing tolerance    Time  8    Period  Weeks    Status  New    Target Date  07/25/17      PT LONG TERM GOAL #5   Title  Pt will tolerate rolling prone and maintaining position for at least 10 mins for stretching hip flexors.      Time  8    Period  Weeks    Status  New    Target Date  07/25/17      PT LONG TERM GOAL #6   Title  Pt will amb. 30' at home with RW with min assist +1    Time  8    Period  Weeks    Status  New    Target Date  07/25/17  Plan - 07/01/17 0804    Clinical Impression Statement  Pt has met STG's #1-3; STG's #4-5 ongoing (not tested due to time constraint); pt ambulated furthest distance to date (42') with only PT assisting and 2nd person following with wheelchair;    Rehab Potential  Good    Clinical Impairments Affecting Rehab Potential  severity of impairments and length of time since onset    PT Frequency  2x / week    PT Duration  8 weeks    PT Treatment/Interventions  ADLs/Self Care  Home Management;Therapeutic exercise;Balance training;Therapeutic activities;Functional mobility training;Gait training;Neuromuscular re-education;Patient/family education;DME Instruction;Wheelchair mobility training;Passive range of motion    PT Next Visit Plan  Gait training with RW; stretches prn on mat - prone - (if pt c/o back/leg spasms)    PT Home Exercise Plan  stretches and LE strengthening, continue to work on sit/stand transfers, gait with RW. standing balance activities    Recommended Other Services  plan to contact [facility] for new manual wheelchair if possible; request order for RW from Dr. Naaman Plummer       Patient will benefit from skilled therapeutic intervention in order to improve the following deficits and impairments:  Abnormal gait, Decreased activity tolerance, Decreased balance, Decreased coordination, Decreased range of motion, Decreased mobility, Decreased endurance, Decreased strength, Impaired flexibility, Increased muscle spasms, Pain, Impaired tone  Visit Diagnosis: Other abnormalities of gait and mobility  Muscle weakness (generalized)     Problem List Patient Active Problem List   Diagnosis Date Noted  . Nasal congestion 04/05/2017  . Breast pain 01/26/2017  . Upper respiratory infection, viral 01/26/2017  . Counseling regarding advanced care planning and goals of care 01/02/2017  . Bilateral rotator cuff syndrome 02/12/2016  . E. coli UTI 08/01/2015  . Constipation due to neurogenic bowel 08/01/2015  . Muscle spasticity   . 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 Spastic paraparesis   . Neurogenic bladder   . History of pulmonary embolism   . Post-operative pain   . Metastatic cancer ([place])   . Steroid-induced hyperglycemia   . Depression   . Obstipation   . Thoracic spine tumor 07/06/2015  . 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 Cord compression ([place])   . Postoperative hypothyroidism   . 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 Spasticity 09/19/2014  . Dysuria 09/04/2014  . Hypertension 05/01/2014  . Ingrown right big toenail 05/01/2014  . GERD (gastroesophageal reflux disease) 02/14/2014  . Dysphagia, pharyngoesophageal phase 02/06/2014  . Type 2 diabetes mellitus without complication ([place]) 51/76/1607  . Constipation   . Neurogenic bowel   . 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 Metastatic cancer to spine ([place]) 01/19/2014  . Rotator cuff tendonitis   . 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 Leg weakness, bilateral 12/13/2013    Alda Lea, PT 07/01/2017, 8:43 AM  [facility] [address] [place], [place], [address] Phone: [phone]   Fax:  [phone]  Name: Sarah [name] MRN: [id number] Date of Birth: 23-Jan-1966

## 2017-07-02 ENCOUNTER — Telehealth: Payer: Self-pay | Admitting: Physical Therapy

## 2017-07-02 DIAGNOSIS — G8220 Paraplegia, unspecified: Secondary | ICD-10-CM

## 2017-07-02 NOTE — Telephone Encounter (Signed)
Dr. Naaman Plummer, Could you please write an order for a rolling walker for Sarah [name]?  (and place in Epic)   Thanks, [place]

## 2017-07-03 MED FILL — LENVIMA 24 MG DAILY DOSE: 2 X 10 MG & | 30 days supply | Qty: 90 | Fill #0

## 2017-07-03 NOTE — Telephone Encounter (Signed)
Order written

## 2017-07-09 ENCOUNTER — Ambulatory Visit: Payer: Medicare Other | Admitting: Physical Therapy

## 2017-07-09 ENCOUNTER — Encounter: Payer: Self-pay | Admitting: Physical Therapy

## 2017-07-09 DIAGNOSIS — R29818 Other symptoms and signs involving the nervous system: Secondary | ICD-10-CM | POA: Diagnosis not present

## 2017-07-09 DIAGNOSIS — G8222 Paraplegia, incomplete: Secondary | ICD-10-CM | POA: Diagnosis not present

## 2017-07-09 DIAGNOSIS — M6281 Muscle weakness (generalized): Secondary | ICD-10-CM | POA: Diagnosis not present

## 2017-07-09 DIAGNOSIS — R2689 Other abnormalities of gait and mobility: Secondary | ICD-10-CM | POA: Diagnosis not present

## 2017-07-10 ENCOUNTER — Telehealth: Payer: Self-pay | Admitting: Hematology

## 2017-07-10 NOTE — Therapy (Signed)
[facility] [address] [place], [place], [address] Phone: [phone]   Fax:  [phone]  Physical Therapy Treatment  Patient Details  Name: Sarah [name] MRN: [id number] Date of Birth: [birth year] Referring Provider: Dr. Alger Simons   Encounter Date: 07/09/2017  PT End of Session - 07/09/17 1453    Visit Number  6    Number of Visits  17    Date for PT Re-Evaluation  07/25/17    Authorization Type  UHC Medicare    Authorization Time Period  05-26-17 - 07-25-17    PT Start Time  1448    PT Stop Time  1530    PT Time Calculation (min)  42 min    Equipment Utilized During Treatment  Gait belt    Activity Tolerance  Patient tolerated treatment well;Patient limited by fatigue    Behavior During Therapy  [facility] for tasks assessed/performed       Past Medical History:  Diagnosis Date  . 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 Chronic back pain   . DDD (degenerative disc disease), cervical   . DJD (degenerative joint disease)   . History of rectal fissure   . HIT (heparin-induced thrombocytopenia) ([place])   . Hypertension   . Obesity   . 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 Pulmonary embolus, right ([place]) 2015  . Rotator cuff tendonitis right    Past Surgical History:  Procedure Laterality Date  . LAMINECTOMY N/A 12/14/2013   Procedure: THORACIC LAMINECTOMY FOR TUMOR THORACIC THREE;  Surgeon: Ashok Pall, MD;  Location: [facility];  Service: Neurosurgery;  Laterality: N/A;  THORACIC LAMINECTOMY FOR TUMOR THORACIC THREE  . LAMINECTOMY N/A 07/05/2015   Procedure: THORACIC LAMINECTOMY FOR TUMOR;  Surgeon: Ashok Pall, MD;  Location: [facility] [facility];  Service: Neurosurgery;  Laterality: N/A;  . POSTERIOR LUMBAR FUSION 4 LEVEL N/A 12/30/2013   Procedure: Thoracic one-Thoracic five posterior thoracic fusion with pedicle screws;  Surgeon: Ashok Pall, MD;  Location: [place];   Service: Neurosurgery;  Laterality: N/A;  Thoracic one-Thoracic five posterior thoracic fusion with pedicle screws  . THYROIDECTOMY N/A 01/09/2014   Procedure: TOTAL THYROIDECTOMY;  Surgeon: Izora Gala, MD;  Location: [facility];  Service: ENT;  Laterality: N/A;  . TONSILLECTOMY      There were no vitals filed for this visit.  Subjective Assessment - 07/09/17 1451    Subjective  No new complaints. No falls. Has some back pain and charley horse in left leg that started this am.    Pertinent History  thoracic laminectomy for spinal tumor 12-14-13; 07-05-15 for 2nd sx due to tumor regrowth:  DDD; Rt RTC tendonitis; DDD and DJD:  chronic back pain    Patient Stated Goals  get legs stronger and to walk    Currently in Pain?  Yes    Pain Score  3     Pain Location  Generalized    Pain Orientation  Left;Lower    Pain Descriptors / Indicators  Aching;Spasm;Cramping;Discomfort;Tightness cramping in left leg for chaley horse    Pain Type  Chronic pain    Pain Onset  More than a month ago    Pain Frequency  Constant    Aggravating Factors   unknown    Pain Relieving Factors  tylenol              [facility] Adult PT Treatment/Exercise - 07/09/17 1454  Transfers   Transfers  Sit to Stand;Stand to [employer] Transfers    Sit to Stand  4: Min guard;4: Min assist;From elevated surface;With upper extremity assist;From chair/3-in-1;From bed    Stand to Sit  4: Min guard;With upper extremity assist;To bed;To chair/3-in-1;4: Min assist    Stand Pivot Transfers  4: Min guard;4: Min Doctor, general practice Details (indicate cue type and reason)  with RW: wheelchair to/from mat table x1 rep each way. cues on posture and sequencing needed.       Ambulation/Gait   Ambulation/Gait  Yes    Ambulation/Gait Assistance  4: Min assist;4: Min guard    Ambulation/Gait Assistance Details  mostly min guard assist with bouts of min assist when pt gets fatigued toward end of gait cycle. second person for  safety/chair follow today    Ambulation Distance (Feet)  10 Feet x1, 20 x2    Assistive device  Rolling walker    Gait Pattern  Step-through pattern;Decreased stride length;Trunk flexed    Ambulation Surface  Level;Indoor             PT Short Term Goals - 07/01/17 0800      PT SHORT TERM GOAL #1   Title  Perform sit to stand transfer from wheelchair to RW with +1 min assist.    Baseline  +1 ony on 06-30-17    Status  Achieved      PT SHORT TERM GOAL #2   Title  Amb. with RW 25' with +2 min assist for incr. household accessibility.    Baseline  met on 06-30-17 ; furthest distance to date 71' in 1 rep    Status  Achieved      PT SHORT TERM GOAL #3   Title  Independent in HEP for LE strengthening and stretching.                            Status  Achieved      PT SHORT TERM GOAL #4   Title  Pt will stand at counter/ sink for 3" with bil. UE support with +1 min assist.    Baseline  pt reports standing at home - Not tested on 06-30-17 due to time constraint    Status  On-going    Target Date  07/26/17      PT SHORT TERM GOAL #5   Title  Incr. endurance/activity tolerance so pt. able to perform 10' nonstop on recumbent bike (Scifit)                   Baseline  Not tested due to time constraint - 06-30-17    Status  On-going    Target Date  07/26/17        PT Long Term Goals - 05/28/17 1649      PT LONG TERM GOAL #1   Title  Pt will transfer sit to stand from wheelchair to RW with CGA.    Time  8    Period  Weeks    Status  New    Target Date  07/25/17      PT LONG TERM GOAL #2   Title  Stand at countertop modified independently for at least 5" per pt report to demo increased LE strength and improved standing tolerance    Time  8    Period  Weeks    Status  New    Target Date  07/25/17  PT LONG TERM GOAL #5   Title  Pt will tolerate rolling prone and maintaining position for at least 10 mins for stretching hip flexors.      Time  8    Period  Weeks    Status  New     Target Date  07/25/17      PT LONG TERM GOAL #6   Title  Pt will amb. 30' at home with RW with min assist +1    Time  8    Period  Weeks    Status  New    Target Date  07/25/17            Plan - 07/09/17 1453    Clinical Impression Statement  Today's skilled session continued to focus on transfers with RW and gait with RW. Pt continues to fatigue quickly and need encouragement due to anxiey/nerve's with standing/gait. Pt is progressing toward goals and should benefit from continued PT to progress toward unmet goals.     Rehab Potential  Good    Clinical Impairments Affecting Rehab Potential  severity of impairments and length of time since onset    PT Frequency  2x / week    PT Duration  8 weeks    PT Treatment/Interventions  ADLs/Self Care Home Management;Therapeutic exercise;Balance training;Therapeutic activities;Functional mobility training;Gait training;Neuromuscular re-education;Patient/family education;DME Instruction;Wheelchair mobility training;Passive range of motion    PT Next Visit Plan  Gait training with RW; stretches prn on mat - prone - (if pt c/o back/leg spasms)    PT Home Exercise Plan  stretches and LE strengthening, continue to work on sit/stand transfers, gait with RW. standing balance activities       Patient will benefit from skilled therapeutic intervention in order to improve the following deficits and impairments:  Abnormal gait, Decreased activity tolerance, Decreased balance, Decreased coordination, Decreased range of motion, Decreased mobility, Decreased endurance, Decreased strength, Impaired flexibility, Increased muscle spasms, Pain, Impaired tone  Visit Diagnosis: Other abnormalities of gait and mobility  Muscle weakness (generalized)  Paraplegia, incomplete (HCC)  Other symptoms and signs involving the nervous system     Problem List Patient Active Problem List   Diagnosis Date Noted  . Nasal congestion 04/05/2017  . Breast pain  01/26/2017  . Upper respiratory infection, viral 01/26/2017  . Counseling regarding advanced care planning and goals of care 01/02/2017  . Bilateral rotator cuff syndrome 02/12/2016  . E. coli UTI 08/01/2015  . Constipation due to neurogenic bowel 08/01/2015  . Muscle spasticity   . 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 Spastic paraparesis   . Neurogenic bladder   . History of pulmonary embolism   . Post-operative pain   . Metastatic cancer ([place])   . Steroid-induced hyperglycemia   . Depression   . Obstipation   . Thoracic spine tumor 07/06/2015  . 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 Cord compression ([place])   . Postoperative hypothyroidism   . Secondary malignant neoplasm of vertebral column ([place]) 03/08/2015  . Spasticity 09/19/2014  . Dysuria 09/04/2014  . Hypertension 05/01/2014  . Ingrown right big toenail 05/01/2014  . GERD (gastroesophageal reflux disease) 02/14/2014  . Dysphagia, pharyngoesophageal phase 02/06/2014  . Type 2 diabetes mellitus without complication ([place]) 51/76/1607  . Constipation   . Neurogenic bowel   . 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 Metastatic cancer to spine ([place]) 01/19/2014  . Rotator cuff tendonitis   . 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 Hurthle cell  carcinoma of thyroid ([place]) 12/29/2013  . Leg weakness, bilateral 12/13/2013    Willow Ora, PTA, [facility] [address], [place], [place] [address] [phone] 07/10/17, 10:41 PM   Name: WADIE MATTIE MRN: [id number] Date of Birth: 1965/05/19

## 2017-07-10 NOTE — Telephone Encounter (Signed)
Patient called to verify her time

## 2017-07-13 ENCOUNTER — Other Ambulatory Visit: Payer: Self-pay | Admitting: Physical Medicine & Rehabilitation

## 2017-07-13 DIAGNOSIS — M75101 Unspecified rotator cuff tear or rupture of right shoulder, not specified as traumatic: Secondary | ICD-10-CM

## 2017-07-13 DIAGNOSIS — C73 Malignant neoplasm of thyroid gland: Secondary | ICD-10-CM

## 2017-07-13 DIAGNOSIS — M10071 Idiopathic gout, right ankle and foot: Secondary | ICD-10-CM

## 2017-07-13 DIAGNOSIS — I10 Essential (primary) hypertension: Secondary | ICD-10-CM

## 2017-07-13 DIAGNOSIS — M75102 Unspecified rotator cuff tear or rupture of left shoulder, not specified as traumatic: Secondary | ICD-10-CM

## 2017-07-13 DIAGNOSIS — G8220 Paraplegia, unspecified: Secondary | ICD-10-CM

## 2017-07-15 ENCOUNTER — Encounter: Payer: Self-pay | Admitting: Physical Therapy

## 2017-07-15 ENCOUNTER — Ambulatory Visit: Payer: Medicare Other | Admitting: Physical Therapy

## 2017-07-15 DIAGNOSIS — R2689 Other abnormalities of gait and mobility: Secondary | ICD-10-CM | POA: Diagnosis not present

## 2017-07-15 DIAGNOSIS — R29818 Other symptoms and signs involving the nervous system: Secondary | ICD-10-CM | POA: Diagnosis not present

## 2017-07-15 DIAGNOSIS — G8222 Paraplegia, incomplete: Secondary | ICD-10-CM

## 2017-07-15 DIAGNOSIS — M6281 Muscle weakness (generalized): Secondary | ICD-10-CM | POA: Diagnosis not present

## 2017-07-16 ENCOUNTER — Other Ambulatory Visit: Payer: Self-pay | Admitting: *Deleted

## 2017-07-16 DIAGNOSIS — G8220 Paraplegia, unspecified: Secondary | ICD-10-CM

## 2017-07-16 DIAGNOSIS — M75102 Unspecified rotator cuff tear or rupture of left shoulder, not specified as traumatic: Secondary | ICD-10-CM

## 2017-07-16 DIAGNOSIS — I10 Essential (primary) hypertension: Secondary | ICD-10-CM

## 2017-07-16 DIAGNOSIS — C73 Malignant neoplasm of thyroid gland: Secondary | ICD-10-CM

## 2017-07-16 DIAGNOSIS — M10071 Idiopathic gout, right ankle and foot: Secondary | ICD-10-CM

## 2017-07-16 DIAGNOSIS — M75101 Unspecified rotator cuff tear or rupture of right shoulder, not specified as traumatic: Secondary | ICD-10-CM

## 2017-07-16 MED ORDER — PANTOPRAZOLE SODIUM 40 MG PO TBEC: 40.0000 mg | DELAYED_RELEASE_TABLET | Freq: Every day | ORAL | 2 refills | Status: DC

## 2017-07-16 MED ORDER — COLCHICINE 0.6 MG PO TABS: ORAL_TABLET | ORAL | 3 refills | Status: DC

## 2017-07-16 NOTE — Therapy (Signed)
[facility] [address] [place], [place], [address] Phone: [phone]   Fax:  [phone]  Physical Therapy Treatment  Patient Details  Name: Sarah [name] MRN: [id number] Date of Birth: 05-May-1965 Referring Provider: Dr. Alger Simons   Encounter Date: 07/15/2017  PT End of Session - 07/15/17 2220    Visit Number  7    Number of Visits  17    Date for PT Re-Evaluation  07/25/17    Authorization Type  [employer] Medicare    Authorization Time Period  05-26-17 - 07-25-17    PT Start Time  1106    PT Stop Time  1148    PT Time Calculation (min)  42 min    Equipment Utilized During Treatment  Gait belt    Activity Tolerance  Patient tolerated treatment well;Patient limited by fatigue    Behavior During Therapy  [facility] for tasks assessed/performed       Past Medical History:  Diagnosis Date  . Cancer 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 Chronic back pain   . DDD (degenerative disc disease), cervical   . DJD (degenerative joint disease)   . History of rectal fissure   . HIT (heparin-induced thrombocytopenia) ([place])   . Hypertension   . Obesity   . 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 ([place])   . Pulmonary embolus, right ([place]) 2015  . Rotator cuff tendonitis right    Past Surgical History:  Procedure Laterality Date  . LAMINECTOMY N/A 12/14/2013   Procedure: THORACIC LAMINECTOMY FOR TUMOR THORACIC THREE;  Surgeon: Ashok Pall, MD;  Location: [facility];  Service: Neurosurgery;  Laterality: N/A;  THORACIC LAMINECTOMY FOR TUMOR THORACIC THREE  . LAMINECTOMY N/A 07/05/2015   Procedure: THORACIC LAMINECTOMY FOR TUMOR;  Surgeon: Ashok Pall, MD;  Location: [place] [facility];  Service: Neurosurgery;  Laterality: N/A;  . POSTERIOR LUMBAR FUSION 4 LEVEL N/A 12/30/2013   Procedure: Thoracic one-Thoracic five posterior thoracic fusion with pedicle screws;  Surgeon: Ashok Pall, MD;  Location: [place] [facility];   Service: Neurosurgery;  Laterality: N/A;  Thoracic one-Thoracic five posterior thoracic fusion with pedicle screws  . THYROIDECTOMY N/A 01/09/2014   Procedure: TOTAL THYROIDECTOMY;  Surgeon: Izora Gala, MD;  Location: [facility];  Service: ENT;  Laterality: N/A;  . TONSILLECTOMY      There were no vitals filed for this visit.  Subjective Assessment - 07/15/17 1107    Subjective  Her trip to beach went well. She has stood a few times.     Pertinent History  thoracic laminectomy for spinal tumor 12-14-13; 07-05-15 for 2nd sx due to tumor regrowth:  DDD; Rt RTC tendonitis; DDD and DJD:  chronic back pain    Patient Stated Goals  get legs stronger and to walk    Currently in Pain?  Yes    Pain Score  3     Pain Location  Generalized legs & back    Pain Orientation  Left;Lower    Pain Descriptors / Indicators  Aching;Spasm;Cramping;Discomfort;Tightness    Pain Type  Chronic pain    Pain Onset  More than a month ago    Pain Frequency  Constant    Aggravating Factors   unknown, back maybe bladder infection (going to doctor)    Pain Relieving Factors  tylenol      Gait Training:  Sit to stand w/c elevated seat with armrests with UE assist to RW (requires PT to stabilize RW)  with min guard with increased time. Standing for 3 minutes stretching LEs & back as warm-up. Pt ambulated 20' & 25' turn 90* to chair with w/c cushion to elevate with armrests. Straight path minA to min guard and turning initial turn ModA and progressed to [place].  Patient ambulated straight path for her maximal tolerable distance with w/c following 35' with minA to min guard.   Discussed exercising [employer] stepper with [employer] planning to open in [place] area.                            PT Short Term Goals - 07/01/17 0800      PT SHORT TERM GOAL #1   Title  Perform sit to stand transfer from wheelchair to RW with +1 min assist.    Baseline  +1 ony on 06-30-17    Status  Achieved      PT SHORT  TERM GOAL #2   Title  Amb. with RW 25' with +2 min assist for incr. household accessibility.    Baseline  met on 06-30-17 ; furthest distance to date 58' in 1 rep    Status  Achieved      PT SHORT TERM GOAL #3   Title  Independent in HEP for LE strengthening and stretching.                            Status  Achieved      PT SHORT TERM GOAL #4   Title  Pt will stand at counter/ sink for 3" with bil. UE support with +1 min assist.    Baseline  pt reports standing at home - Not tested on 06-30-17 due to time constraint    Status  On-going    Target Date  07/26/17      PT SHORT TERM GOAL #5   Title  Incr. endurance/activity tolerance so pt. able to perform 10' nonstop on recumbent bike ([employer])                   Baseline  Not tested due to time constraint - 06-30-17    Status  On-going    Target Date  07/26/17        PT Long Term Goals - 05/28/17 1649      PT LONG TERM GOAL #1   Title  Pt will transfer sit to stand from wheelchair to RW with CGA.    Time  8    Period  Weeks    Status  New    Target Date  07/25/17      PT LONG TERM GOAL #2   Title  Stand at countertop modified independently for at least 5" per pt report to demo increased LE strength and improved standing tolerance    Time  8    Period  Weeks    Status  New    Target Date  07/25/17      PT LONG TERM GOAL #5   Title  Pt will tolerate rolling prone and maintaining position for at least 10 mins for stretching hip flexors.      Time  8    Period  Weeks    Status  New    Target Date  07/25/17      PT LONG TERM GOAL #6   Title  Pt will amb. 30' at home with RW with min assist +1  Time  8    Period  Weeks    Status  New    Target Date  07/25/17            Plan - 07/15/17 1720    Clinical Impression Statement  Today's skilled session focused on ambulating between 2 chairs to gain confidence without w/c following. She improved ability to turn 90* to position herself to sit with instruction in technique.      Rehab Potential  Good    Clinical Impairments Affecting Rehab Potential  severity of impairments and length of time since onset    PT Frequency  2x / week    PT Duration  8 weeks    PT Treatment/Interventions  ADLs/Self Care Home Management;Therapeutic exercise;Balance training;Therapeutic activities;Functional mobility training;Gait training;Neuromuscular re-education;Patient/family education;DME Instruction;Wheelchair mobility training;Passive range of motion    PT Next Visit Plan  Gait training with RW; stretches prn on mat - prone - (if pt c/o back/leg spasms)    PT Home Exercise Plan  stretches and LE strengthening, continue to work on sit/stand transfers, gait with RW. standing balance activities    Consulted and Agree with Plan of Care  Patient       Patient will benefit from skilled therapeutic intervention in order to improve the following deficits and impairments:  Abnormal gait, Decreased activity tolerance, Decreased balance, Decreased coordination, Decreased range of motion, Decreased mobility, Decreased endurance, Decreased strength, Impaired flexibility, Increased muscle spasms, Pain, Impaired tone  Visit Diagnosis: Other abnormalities of gait and mobility  Muscle weakness (generalized)  Paraplegia, incomplete ([place])     Problem List Patient Active Problem List   Diagnosis Date Noted  . Nasal congestion 04/05/2017  . Breast pain 01/26/2017  . Upper respiratory infection, viral 01/26/2017  . Counseling regarding advanced care planning and goals of care 01/02/2017  . Bilateral rotator cuff syndrome 02/12/2016  . E. coli UTI 08/01/2015  . Constipation due to neurogenic bowel 08/01/2015  . Muscle spasticity   . 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 Spastic paraparesis   . Neurogenic bladder   . History of pulmonary embolism   . Post-operative pain   . Metastatic cancer ([place])   . Steroid-induced hyperglycemia   . Depression   . Obstipation   . Thoracic spine tumor  07/06/2015  . 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 Cord compression ([place])   . Postoperative hypothyroidism   . 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 Spasticity 09/19/2014  . Dysuria 09/04/2014  . Hypertension 05/01/2014  . Ingrown right big toenail 05/01/2014  . GERD (gastroesophageal reflux disease) 02/14/2014  . Dysphagia, pharyngoesophageal phase 02/06/2014  . Type 2 diabetes mellitus without complication ([place]) 99/24/2683  . Constipation   . Neurogenic bowel   . 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 Metastatic cancer to spine ([place]) 01/19/2014  . Rotator cuff tendonitis   . 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 Leg weakness, bilateral 12/13/2013    Elynor Kallenberger PT, DPT 07/16/2017, 7:34 AM  [facility] [address] [place], [place], [address] Phone: [phone]   Fax:  [phone]  Name: DESARIE FEILD MRN: [id number] Date of Birth: [birth year]

## 2017-07-21 ENCOUNTER — Ambulatory Visit: Payer: Medicare Other | Admitting: Physical Therapy

## 2017-07-23 ENCOUNTER — Other Ambulatory Visit: Payer: Self-pay | Admitting: Physical Medicine & Rehabilitation

## 2017-07-23 DIAGNOSIS — M25512 Pain in left shoulder: Secondary | ICD-10-CM | POA: Diagnosis not present

## 2017-07-23 DIAGNOSIS — M25511 Pain in right shoulder: Secondary | ICD-10-CM | POA: Diagnosis not present

## 2017-07-28 ENCOUNTER — Ambulatory Visit: Payer: Medicare Other | Admitting: Physical Therapy

## 2017-07-29 ENCOUNTER — Other Ambulatory Visit: Payer: Self-pay | Admitting: Hematology

## 2017-07-29 NOTE — Progress Notes (Signed)
[name] Kitchen    HEMATOLOGY/ONCOLOGY CLINIC NOTE  Date of Service: 07/30/17   Patient Care Team: [name], MD as PCP - General (Family Medicine) [name], MD as Consulting Physician (Physical Medicine and Rehabilitation) Ashok Pall, MD as Consulting Physician (Neurosurgery) Brunetta Genera, MD as Consulting Physician (Hematology) Jacelyn Pi, MD as Consulting Physician (Endocrinology)   Dayton Bailiff MD (Spine [place]) [phone]. 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  01/09/2014- patient underwent total thyroidectomy by Dr. Elie Goody. Pathology showed Hurthle cell carcinoma with no perineural invasion, presence of lymphovascular invasion and extra thyroidal extension. p T3, p N0, Mx  Received 30 grays in 10 fractions to her C7-T6 lesion finished in November 2015  Received 135 mCi of iodine 131 on 07/21/2014. Whole-body scan was apparently negative.  07/05/2015-07/10/2015 admitted to [facility] for leg weakness. MRI of the T-spine showed recurrent tumor causing spinal cord compression. Patient was emergently taken to the or and underwent a redo thoracic laminectomy of T3 and partial tumor resection.   08/07/2015 PET/CT scan of the chest abdomen pelvis showed slight enlargement of FDG avid left level III and level IV lymph nodes concerning for worsening nodal metastases. Slight enlargement of the hypermetabolic right superior mediastinal mass and multiple metabolically active pulmonary nodules representing worsening metastases. T3 vertebral body and bilateral pedicle metastases status  post posture metallic fusion. No residual uptake in the thyroidectomy bed  Thyroglobulin level was 11.7 (thyroglobulin antibody present 9.8)   HISTORY OF PRESENTING ILLNESS:   Sarah [name] is a wonderful 52 y.o. female who has been referred to [place] by Dr Leamon Arnt (medical oncology at [facility]) for help with local comanagement of the patient's metastatic Hurthle cell thyroid carcinoma with details as noted above. Patient had recent hospitalization from 4/13/7 to 07/10/2015 at [facility] for lower extremity weakness and was noted to have cord compression from recurrent tumor at T3. She is status post redo thoracic laminectomy of T3 and partial tumor resection to try to decompress the core. Pathology was consistent with metastatic Hurthle cell carcinoma.  She has been on dexamethasone 2 mg by mouth twice a day. Continue to have lower extremity weakness and is currently in a skilled nursing facility getting rehabilitation. She feels her lower extremity weakness is possibly getting somewhat better.  Patient had a repeat PET/CT scan at [facility] on 08/07/2015 as noted above which shows progression of her metastatic disease including nodal metastases , pulmonary nodules and right superior mediastinal mass.  MRI of the spine on 08/14/2015 showed improved but persistent cord compression at T3 level.  She was started by Dr. Leamon Arnt on Lenvatinib and 14 mg by mouth daily for progressive metastatic disease after ruling out the option of repeat surgery with Dr. Cyndy Freeze and reradiation with Dr. Ledon Snare.  After a week or so the patient was increased to a dose of 69m po daily. She notes no diarrhea or other gastrointestinal symptoms. No skin rashes.  Is noted to have oral thrush and was started on nystatin swish and swallow.   Patient has a history of pulmonary  embolism 2015 and has continued to be on Xarelto for anticoagulation.   INTERVAL HISTORY  DEZYRE HOEFER returns today for a  follow up regarding her Metastatic Hurthle cell thyroid carcinoma. The patient's last visit with [place] was on 05/28/17. She is accompanied today by her sister. The pt reports that she is doing well overall. She will see her endocrinologist next in June.   The pt reports that she has been coughing up some yellow phlegm recently. She denies being around anyone with an infection. She denies pain, and itchiness in her throat. She has used Flonase at night but has not gargled or used mouthwash.   She has been able to walk up to 100 feet in PT and reports increasing leg strength.   Lab results today (07/30/17) of CBC, CMP, and Reticulocytes is as follows: all values are WNL except for CO2 at 30, Alk Phos at 178. Thyroid Panel 07/30/17 is pending.  On review of systems, pt reports yellow phlegm, increasing leg strength, some bruising, regular bowel movements, stable weight, some leg swelling, and denies back pains, changes in breathing, pain along her throat, throat soreness, diarrhea, nausea, abdominal pain, pain along the spine and any other symptoms.    MEDICAL HISTORY:  Past Medical History:  Diagnosis Date  . Cancer [facility])    FNA of thyroid positive for onconytic/hurthle cell carcinoma  . Chronic back pain   . DDD (degenerative disc disease), cervical   . DJD (degenerative joint disease)   . History of rectal fissure   . HIT (heparin-induced thrombocytopenia) ([place])   . Hypertension   . Obesity   . 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 ([place])   . Pulmonary embolus, right ([place]) 2015  . Rotator cuff tendonitis right    SURGICAL HISTORY: Past Surgical History:  Procedure Laterality Date  . LAMINECTOMY N/A 12/14/2013   Procedure: THORACIC LAMINECTOMY FOR TUMOR THORACIC THREE;  Surgeon: Ashok Pall, MD;  Location: [place] NEURO ORS;  Service: Neurosurgery;  Laterality: N/A;  THORACIC LAMINECTOMY FOR TUMOR THORACIC THREE  . LAMINECTOMY N/A 07/05/2015   Procedure: THORACIC LAMINECTOMY FOR TUMOR;  Surgeon: Ashok Pall, MD;   Location: [place] NEURO ORS;  Service: Neurosurgery;  Laterality: N/A;  . POSTERIOR LUMBAR FUSION 4 LEVEL N/A 12/30/2013   Procedure: Thoracic one-Thoracic five posterior thoracic fusion with pedicle screws;  Surgeon: Ashok Pall, MD;  Location: [place] NEURO ORS;  Service: Neurosurgery;  Laterality: N/A;  Thoracic one-Thoracic five posterior thoracic fusion with pedicle screws  . THYROIDECTOMY N/A 01/09/2014   Procedure: TOTAL THYROIDECTOMY;  Surgeon: Izora Gala, MD;  Location: [place];  Service: ENT;  Laterality: N/A;  . TONSILLECTOMY      SOCIAL HISTORY: Social History   Socioeconomic History  . Marital status: Single    Spouse name: Not on file  . Number of children: Not on file  . Years of education: Not on file  . Highest education level: Not on file  Occupational History  . Not on file  Social Needs  . Financial resource strain: Not on file  . Food insecurity:    Worry: Not on file    Inability: Not on file  . Transportation needs:    Medical: Not on file    Non-medical: Not on file  Tobacco Use  . Smoking status: Never Smoker  . Smokeless tobacco: Never Used  Substance and Sexual Activity  . Alcohol use: No  . Drug use: No  . Sexual activity: Not Currently  Lifestyle  . Physical activity:  Days per week: Not on file    Minutes per session: Not on file  . Stress: Not on file  Relationships  . Social connections:    Talks on phone: Not on file    Gets together: Not on file    Attends religious service: Not on file    Active member of club or organization: Not on file    Attends meetings of clubs or organizations: Not on file    Relationship status: Not on file  . Intimate partner violence:    Fear of current or ex partner: Not on file    Emotionally abused: Not on file    Physically abused: Not on file    Forced sexual activity: Not on file  Other Topics Concern  . Not on file  Social History Narrative  . Not on file    FAMILY HISTORY: Family History  Problem  Relation Age of Onset  . Hypertension Mother   . Diabetes Mother   . Hypertension Father   . Stroke Father   . Diabetes Father   . Diabetes Sister   . Diabetes Sister     ALLERGIES:  is allergic to bee venom; keflex [cephalexin]; tramadol; gabapentin; heparin; hydrochlorothiazide; hydrocodone; and oxycodone-acetaminophen.  MEDICATIONS:  Current Outpatient Medications  Medication Sig Dispense Refill  . baclofen (LIORESAL) 20 MG tablet TAKE (1) TABLET THREE TIMES DAILY. 90 tablet 0  . colchicine 0.6 MG tablet Take one tablet by mouth as needed for gout. 30 tablet 0  . colchicine 0.6 MG tablet TAKE  (1)  TABLET TWICE A DAY AS NEEDED. 30 tablet 3  . fluconazole (DIFLUCAN) 100 MG tablet Take 1 tablet (100 mg total) by mouth daily. 7 tablet 0  . fluticasone (FLONASE) 50 MCG/ACT nasal spray Place 2 sprays into both nostrils daily. 16 g 6  . furosemide (LASIX) 20 MG tablet Take 1 tablet (20 mg total) by mouth daily as needed. (Patient taking differently: Take 20 mg by mouth daily as needed for fluid. ) 30 tablet 1  . LENVIMA 24 MG DAILY DOSE 10 (2) & 4 MG capsule TAKE 24 MG (2, 10 MG CAPSULES AND 1, 4MG CAPSULE) BY MOUTH DAILY. 90 capsule 0  . levothyroxine (SYNTHROID, LEVOTHROID) 175 MCG tablet Take 150 mcg by mouth daily before breakfast.     . lisinopril (PRINIVIL,ZESTRIL) 20 MG tablet Take 2 tablets (40 mg total) by mouth daily. 60 tablet 1  . mupirocin ointment (BACTROBAN) 2 % Place 1 application into the nose 2 (two) times daily. And on sores on extremities and face 22 g 0  . NIFEdipine (PROCARDIA XL) 30 MG 24 hr tablet Take 1 tablet (30 mg total) by mouth daily. 30 tablet 1  . ondansetron (ZOFRAN) 4 MG tablet Take 1 tablet (4 mg total) by mouth every 6 (six) hours. 30 tablet 5  . pantoprazole (PROTONIX) 40 MG tablet Take 1 tablet (40 mg total) by mouth daily. 30 tablet 0  . pantoprazole (PROTONIX) 40 MG tablet Take 1 tablet (40 mg total) by mouth daily. 30 tablet 2  . promethazine  (PHENERGAN) 25 MG tablet Take 1 tablet (25 mg total) by mouth every 6 (six) hours as needed for nausea or vomiting. 20 tablet 0  . rivaroxaban (XARELTO) 20 MG TABS tablet Take 1 tablet (20 mg total) by mouth daily. 30 tablet 6  . senna (SENOKOT) 8.6 MG TABS tablet Take 2 tablets (17.2 mg total) by mouth daily. 120 each 0  . sertraline (ZOLOFT) 25  MG tablet Take 1 tablet (25 mg total) by mouth daily. 30 tablet 3   No current facility-administered medications for this visit.     REVIEW OF SYSTEMS:    A 10+ POINT REVIEW OF SYSTEMS WAS OBTAINED including neurology, dermatology, psychiatry, cardiac, respiratory, lymph, extremities, GI, GU, Musculoskeletal, constitutional, breasts, reproductive, HEENT.  All pertinent positives are noted in the HPI.  All others are negative.    PHYSICAL EXAMINATION:  ECOG PERFORMANCE STATUS: 3 - Symptomatic, >50% confined to bed  Vitals:   07/30/17 1135  BP: (!) 145/97  Pulse: 83  Resp: 17  Temp: 98.2 F (36.8 C)  SpO2: 100%   Filed Weights   .Body mass index is 29.01 kg/m. GENERAL:alert, in no acute distress and comfortable SKIN: no acute rashes, no significant lesions EYES: conjunctiva are pink and non-injected, sclera anicteric OROPHARYNX: MMM, no exudates, no oropharyngeal erythema or ulceration NECK: supple, no JVD LYMPH:  no palpable lymphadenopathy in the cervical, axillary or inguinal regions LUNGS: clear to auscultation b/l with normal respiratory effort HEART: regular rate & rhythm ABDOMEN:  normoactive bowel sounds , non tender, not distended. Extremity: trace pedal edema PSYCH: alert & oriented x 3 with fluent speech NEURO: no focal motor/sensory deficits    LABORATORY DATA:  I have reviewed the data as listed  . CBC Latest Ref Rng & Units 07/30/2017 05/28/2017 04/13/2017  WBC 3.9 - 10.3 K/uL 4.5 4.1 3.1(L)  Hemoglobin 11.6 - 15.9 g/dL 11.7 13.8 12.8  Hematocrit 34.8 - 46.6 % 36.2 41.6 38.9  Platelets 145 - 400 K/uL 201 164 167    ANC 2.3k . CMP Latest Ref Rng & Units 05/28/2017 04/13/2017 01/02/2017  Glucose 70 - 140 mg/dL 102 91 87  BUN 7 - 26 mg/dL 9 10 11.9  Creatinine 0.60 - 1.10 mg/dL 0.83 0.90 0.9  Sodium 136 - 145 mmol/L 140 143 143  Potassium 3.5 - 5.1 mmol/L 4.4 4.1 4.0  Chloride 98 - 109 mmol/L 102 104 -  CO2 22 - 29 mmol/L 30(H) 28 25  Calcium 8.4 - 10.4 mg/dL 9.3 8.8 8.8  Total Protein 6.4 - 8.3 g/dL 6.8 6.5 6.3(L)  Total Bilirubin 0.2 - 1.2 mg/dL 1.4(H) 0.6 1.06  Alkaline Phos 40 - 150 U/L 139 130 126  AST 5 - 34 U/L _0 ALT 0 - 55 U/L _1 RADIOGRAPHIC STUDIES: I have personally reviewed the radiological images as listed and agreed with the findings in the report.  .No results found.   ASSESSMENT & PLAN:  52 year old female with   1) Stage IVC metastatic Hurthle cell thyroid carcinoma with metastases to the spine, lung, superior mediastinal mass and nodal metastases. Improved based on last PET/CT  Patient has had treatment as noted above. h/o cord compression at T3 level due to recurrent tumor status post partial resection and debulking.   MRI on 08/14/2015 showed improved but persistent cord compression at the T3 level. She has been noted not to be a candidate for further repeat surgery at this time or repeat radiation. Recent PET/CT scan is noted of bowel done at [facility] on 08/07/2015 showed evidence of tumor progression and patient was started on lenvatinib as per Dr. Leamon Arnt. Her thyroglobulin level was 11.7 with anti-body of 9.8 on 08/07/2015 at [place]  PET/CT 11/01/2015: shows relatively stable though high burden of disease. Some concern of local recurrence in the thyroidectomy bed which is asymptomatic at this time. MRI thoracic  spine 12/25/2015: No new abnormality since the prior examination. Status post T1-5 fusion for T3 decompression. Mass effect on the cord at the T3 level appears improved compared to the prior exam. Postoperative fluid collection likely representing  a seroma seen on the prior study has slightly decreased in size.  PET/CT 06/03/2016 with Generally reduced hypermetabolic activity in the previously identified lesions, including the pulmonary nodules, dominant upper thoracic metastatic focus, and left lower cervical adenopathy. Mildly reduced size of previous lesions as well. No new lesions.  PET/CT 12/12/2016: Concern for progression of metastatic Hurthle cell thyroid carcinoma with significant increase in metabolic activity of several lymph nodes, pulmonary nodules, and skeletal metastasis. Most convincing element of progression is a significant increase in metabolic activity (more than doubling). Mild increase in size of the lymph nodes.  PET/CT  0/92/33: Partial metabolic response. Left supraclavicular and right paratracheal nodal metastases, bilateral pulmonary metastases, and osseous metastases involving the upper thoracic spine and right sternum, with mild decrease in metabolism, as described above.   Plan: -Discussed pt labwork today, 07/30/17; blood counts are WNL, Alk Phos at 178- will monitor this, blood chemistries are otherwise stable.  -Begin using baking soda and water mouthwash for throat soreness/mucositis -If pt develops increasing throat symptoms, she will let us know. -Repeat PET in 2 months -The pt has no prohibitive toxicities from continuing lenvatinib at 52m po daily at this time.   2) grade 1 oral mucositis related to Lenvatinib -resolved  3) FDG avidity in the rectum - much improved. No significant concerns on last PET  4) abnormal LFTs with borderline elevation of transaminases and increase in alkaline phosphatase. - Now resolved.  5) Mild Leukopenia without neutropenia resolved. Plan  - patient has no clinical evidence of Hurthle cell carcinoma progression at this time, infact she has had a good partial response to increased dose of the lenvatinib -TSH levels less suppressed today after dose reduction of  levothyroxine per endocrinologist. Recommended she discussed this with Dr BChalmers Caterto determine need for levothyroxine dose adjustment for TSH suppression. Thyroglobulin levels stable and Ab levels -pending. -Continue Lenvatinib to 253mpo daily . No prohibitive toxicities. BP better controlled today  6) history of right-sided pulmonary embolism on chronic anticoagulation with Rivaroxaban. Monitor for bleeding. No bleeding reported  7)  HTN.-previously uncontrolled. Better controlled today No CP/SOB Plan -continue f/u with PCP and I informed her to begin taking her blood pressures in the mornings for a more accurate reading.    PET/CT in 8 weeks RTC with Dr KaIrene Limbon 2 months with labs and PET    All of the patients questions were answered with apparent satisfaction. The patient knows to call the clinic with any problems, questions or concerns.  The toal time spent in the appt was 20 minutes and more than 50% was on counseling and direct patient cares.      GaSullivan LoneD MSSan SabaAHIVMS SCPomerado HospitalTBrentwood Surgery Center LLCematology/Oncology Physician CoSunriver(Office):       [phone] cell):  [phone]):           [phone] document serves as a record of services personally performed by GaSullivan LoneMD. It was created on his behalf by ScBaldwin Jamaicaa trained medical scribe. The creation of this record is based on the scribe's personal observations and the provider's statements to them.   .I have reviewed the above documentation for accuracy and completeness, and I agree with the above. .GBrunetta Genera  MD MS

## 2017-07-30 ENCOUNTER — Ambulatory Visit: Payer: Medicare Other | Admitting: Physical Therapy

## 2017-07-30 ENCOUNTER — Telehealth: Payer: Self-pay | Admitting: Hematology

## 2017-07-30 ENCOUNTER — Inpatient Hospital Stay: Payer: Medicare Other | Attending: Hematology | Admitting: Hematology

## 2017-07-30 ENCOUNTER — Encounter: Payer: Self-pay | Admitting: Hematology

## 2017-07-30 ENCOUNTER — Inpatient Hospital Stay: Payer: Medicare Other

## 2017-07-30 VITALS — BP 145/97 | HR 83 | Temp 98.2°F | Resp 17 | Ht 71.0 in

## 2017-07-30 DIAGNOSIS — Z86711 Personal history of pulmonary embolism: Secondary | ICD-10-CM

## 2017-07-30 DIAGNOSIS — C73 Malignant neoplasm of thyroid gland: Secondary | ICD-10-CM | POA: Insufficient documentation

## 2017-07-30 DIAGNOSIS — Z7901 Long term (current) use of anticoagulants: Secondary | ICD-10-CM | POA: Insufficient documentation

## 2017-07-30 DIAGNOSIS — C7951 Secondary malignant neoplasm of bone: Secondary | ICD-10-CM | POA: Diagnosis not present

## 2017-07-30 DIAGNOSIS — I10 Essential (primary) hypertension: Secondary | ICD-10-CM

## 2017-07-30 DIAGNOSIS — C778 Secondary and unspecified malignant neoplasm of lymph nodes of multiple regions: Secondary | ICD-10-CM

## 2017-07-30 DIAGNOSIS — C7800 Secondary malignant neoplasm of unspecified lung: Secondary | ICD-10-CM | POA: Insufficient documentation

## 2017-07-30 LAB — CBC WITH DIFFERENTIAL (CANCER CENTER ONLY)
BASOS PCT: 0 %
Basophils Absolute: 0 10*3/uL (ref 0.0–0.1)
EOS PCT: 1 %
Eosinophils Absolute: 0 10*3/uL (ref 0.0–0.5)
HCT: 36.2 % (ref 34.8–46.6)
Hemoglobin: 11.7 g/dL (ref 11.6–15.9)
Lymphocytes Relative: 25 %
Lymphs Abs: 1.1 10*3/uL (ref 0.9–3.3)
MCH: 29.9 pg (ref 25.1–34.0)
MCHC: 32.3 g/dL (ref 31.5–36.0)
MCV: 92.6 fL (ref 79.5–101.0)
MONO ABS: 0.4 10*3/uL (ref 0.1–0.9)
MONOS PCT: 9 %
Neutro Abs: 2.9 10*3/uL (ref 1.5–6.5)
Neutrophils Relative %: 65 %
PLATELETS: 201 10*3/uL (ref 145–400)
RBC: 3.91 MIL/uL (ref 3.70–5.45)
RDW: 14.2 % (ref 11.2–14.5)
WBC Count: 4.5 10*3/uL (ref 3.9–10.3)

## 2017-07-30 LAB — CMP (CANCER CENTER ONLY)
ALK PHOS: 178 U/L — AB (ref 40–150)
ALT: 13 U/L (ref 0–55)
AST: 16 U/L (ref 5–34)
Albumin: 3.5 g/dL (ref 3.5–5.0)
Anion gap: 5 (ref 3–11)
BILIRUBIN TOTAL: 0.6 mg/dL (ref 0.2–1.2)
BUN: 14 mg/dL (ref 7–26)
CALCIUM: 8.7 mg/dL (ref 8.4–10.4)
CO2: 30 mmol/L — ABNORMAL HIGH (ref 22–29)
CREATININE: 0.84 mg/dL (ref 0.60–1.10)
Chloride: 106 mmol/L (ref 98–109)
GFR, Est AFR Am: >60 mL/min (ref >60)
GFR, Estimated: >60 mL/min (ref >60)
GLUCOSE: 112 mg/dL (ref 70–140)
Potassium: 4 mmol/L (ref 3.5–5.1)
SODIUM: 141 mmol/L (ref 136–145)
TOTAL PROTEIN: 6.4 g/dL (ref 6.4–8.3)

## 2017-07-30 LAB — RETICULOCYTES
RBC.: 3.91 MIL/uL (ref 3.70–5.45)
RETIC COUNT ABSOLUTE: 50.8 10*3/uL (ref 33.7–90.7)
RETIC CT PCT: 1.3 % (ref 0.7–2.1)

## 2017-07-30 NOTE — Telephone Encounter (Signed)
Appointments scheduled AVS/Calendar printed per 5/9 los

## 2017-07-30 NOTE — Patient Instructions (Signed)
Thank you for choosing [facility] to provide your oncology and hematology care.  To afford each patient quality time with our providers, please arrive 30 minutes before your scheduled appointment time.  If you arrive late for your appointment, you may be asked to reschedule.  We strive to give you quality time with our providers, and arriving late affects you and other patients whose appointments are after yours.   If you are a no show for multiple scheduled visits, you may be dismissed from the clinic at the providers discretion.    Again, thank you for choosing [facility], our hope is that these requests will decrease the amount of time that you wait before being seen by our physicians.  ______________________________________________________________________  Should you have questions after your visit to the [facility], please contact our office at [phone] between the hours of 8:30 and 4:30 p.m.    Voicemails left after 4:30p.m will not be returned until the following business day.    For prescription refill requests, please have your pharmacy contact us directly.  Please also try to allow 48 hours for prescription requests.    Please contact the scheduling department for questions regarding scheduling.  For scheduling of procedures such as PET scans, CT scans, MRI, Ultrasound, etc please contact central scheduling at [phone].    Resources For Cancer Patients and Caregivers:   Oncolink.org:  A wonderful resource for patients and healthcare providers for information regarding your disease, ways to tract your treatment, what to expect, etc.     American Cancer Society:  800-227-2345  Can help patients locate various types of support and financial assistance  Cancer Care: 1-800-813-HOPE (4673) Provides financial assistance, online support groups, medication/co-pay assistance.    [place] DSS:  336-641-3447 Where to apply for food  stamps, Medicaid, and utility assistance  Medicare Rights Center: 800-333-4114 Helps people with Medicare understand their rights and benefits, navigate the Medicare system, and secure the quality healthcare they deserve  SCAT: 336-333-6589 [place] [employer]'s shared-ride transportation service for eligible riders who have a disability that prevents them from riding the fixed route bus.    For additional information on assistance programs please contact our social worker:   Grier Hock/Abigail Elmore:  [phone]            

## 2017-07-31 ENCOUNTER — Encounter: Payer: Self-pay | Admitting: Physical Therapy

## 2017-07-31 ENCOUNTER — Ambulatory Visit: Payer: Medicare Other | Attending: Physical Medicine & Rehabilitation | Admitting: Physical Therapy

## 2017-07-31 DIAGNOSIS — G8222 Paraplegia, incomplete: Secondary | ICD-10-CM | POA: Diagnosis not present

## 2017-07-31 DIAGNOSIS — R2689 Other abnormalities of gait and mobility: Secondary | ICD-10-CM

## 2017-07-31 DIAGNOSIS — M6281 Muscle weakness (generalized): Secondary | ICD-10-CM

## 2017-07-31 LAB — THYROID PANEL WITH TSH
FREE THYROXINE INDEX: 4.2 (ref 1.2–4.9)
T3 UPTAKE RATIO: 33 % (ref 24–39)
T4, Total: 12.6 ug/dL — ABNORMAL HIGH (ref 4.5–12.0)
TSH: 0.006 u[IU]/mL — AB (ref 0.450–4.500)

## 2017-07-31 LAB — THYROGLOBULIN ANTIBODY: Thyroglobulin Antibody: <1 IU/mL (ref 0.0–0.9)

## 2017-07-31 MED FILL — LENVIMA 24 MG DAILY DOSE: 2 X 10 MG & | 30 days supply | Qty: 90 | Fill #0

## 2017-07-31 NOTE — Therapy (Signed)
[facility] [address] [place], [place], [address] Phone: [phone]   Fax:  [phone]  Physical Therapy Treatment  Patient Details  Name: Sarah [name] MRN: [id number] Date of Birth: 05-04-1965 Referring Provider: Dr. Alger Simons   Encounter Date: 07/31/2017  PT End of Session - 07/31/17 1453    Visit Number  8    Number of Visits  17    Date for PT Re-Evaluation  07/25/17    Authorization Type  [employer] Medicare    Authorization Time Period  05-26-17 - 07-25-17    PT Start Time  1448    PT Stop Time  1530    PT Time Calculation (min)  42 min    Equipment Utilized During Treatment  Gait belt    Activity Tolerance  Patient tolerated treatment well;Patient limited by fatigue    Behavior During Therapy  [facility] for tasks assessed/performed       Past Medical History:  Diagnosis Date  . 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 Chronic back pain   . DDD (degenerative disc disease), cervical   . DJD (degenerative joint disease)   . History of rectal fissure   . HIT (heparin-induced thrombocytopenia) ([place])   . Hypertension   . Obesity   . 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 ([place])   . Pulmonary embolus, right ([place]) 2015  . Rotator cuff tendonitis right    Past Surgical History:  Procedure Laterality Date  . LAMINECTOMY N/A 12/14/2013   Procedure: THORACIC LAMINECTOMY FOR TUMOR THORACIC THREE;  Surgeon: Ashok Pall, MD;  Location: [facility]UR[facility]S;  Service: Neurosurgery;  Laterality: N/A;  THORACIC LAMINECTOMY FOR TUMOR THORACIC THREE  . LAMINECTOMY N/A 07/05/2015   Procedure: THORACIC LAMINECTOMY FOR TUMOR;  Surgeon: Ashok Pall, MD;  Location: [place] [facility]UR[facility]S;  Service: Neurosurgery;  Laterality: N/A;  . POSTERIOR LUMBAR FUSION 4 LEVEL N/A 12/30/2013   Procedure: Thoracic one-Thoracic five posterior thoracic fusion with pedicle screws;  Surgeon: Ashok Pall, MD;  Location: [facility] NEURO ORS;   Service: Neurosurgery;  Laterality: N/A;  Thoracic one-Thoracic five posterior thoracic fusion with pedicle screws  . THYROIDECTOMY N/A 01/09/2014   Procedure: TOTAL THYROIDECTOMY;  Surgeon: Izora Gala, MD;  Location: [facility];  Service: ENT;  Laterality: N/A;  . TONSILLECTOMY      There were no vitals filed for this visit.  Subjective Assessment - 07/31/17 1451    Subjective  No new complaints. Has been standing to RW at home by her self (has sink to her right, RW in front of her and wheelchair behind her, uses sink to push up on ) No falls.     Pertinent History  thoracic laminectomy for spinal tumor 12-14-13; 07-05-15 for 2nd sx due to tumor regrowth:  DDD; Rt RTC tendonitis; DDD and DJD:  chronic back pain    Patient Stated Goals  get legs stronger and to walk    Currently in Pain?  Yes    Pain Score  3     Pain Location  Back    Pain Orientation  Lower    Pain Descriptors / Indicators  Aching;Sore    Pain Type  Chronic pain    Pain Onset  More than a month ago    Pain Frequency  Constant    Aggravating Factors   prolonged sitting, immobility    Pain Relieving Factors  tylenol          [facility]  Adult PT Treatment/Exercise - 07/31/17 1521      Bed Mobility   Bed Mobility  Rolling Right;Rolling Left;Supine to Sit;Sit to Supine    Rolling Right  4: Min guard;3: Mod assist    Rolling Right Details (indicate cue type and reason)  pt able to roll herself into right sidelying, mod/max assit to come over onto bil forarms and lower down for prone lying.     Sit to Supine  4: Min guard    Sit to Supine - Details (indicate cue type and reason)  pt was able to lie herself down on mat table going toward her left side, increased time needed with increased effort to clear mat surface with LE's      Transfers   Transfers  Sit to Stand;Stand to Sit;Stand Pivot Transfers    Sit to Stand  4: Min guard;With upper extremity assist;3: Mod assist;From chair/3-in-1    Sit to Stand Details (indicate cue  type and reason)  min guard assist to stand to sink from wheelchair with UE assistance. mod assist to stand from wheelchair to RW in prep for SPT to mat table.     Stand to Sit  4: Min guard;4: Min assist;With upper extremity assist;To chair/3-in-1;With armrests    Stand to Sit Details  min guard to sit to wheelchair with sink assist; min assist for controlled descent to mat table after SPT with RW.     Stand Pivot Transfers  4: Min guard    Stand Pivot Transfer Details (indicate cue type and reason)  with RW: wheelchair to/from mat table.       Therapeutic Activites    Therapeutic Activities  Other Therapeutic Activities    Other Therapeutic Activities  pt was able to stand at sink with UE support for 5 minutes: worked on improved posture- hip/knee extension, trunk extension and equal weight shifitng.           PT Short Term Goals - 07/01/17 0800      PT SHORT TERM GOAL #1   Title  Perform sit to stand transfer from wheelchair to RW with +1 min assist.    Baseline  +1 ony on 06-30-17    Status  Achieved      PT SHORT TERM GOAL #2   Title  Amb. with RW 25' with +2 min assist for incr. household accessibility.    Baseline  met on 06-30-17 ; furthest distance to date 3' in 1 rep    Status  Achieved      PT SHORT TERM GOAL #3   Title  Independent in HEP for LE strengthening and stretching.                            Status  Achieved      PT SHORT TERM GOAL #4   Title  Pt will stand at counter/ sink for 3" with bil. UE support with +1 min assist.    Baseline  pt reports standing at home - Not tested on 06-30-17 due to time constraint    Status  On-going    Target Date  07/26/17      PT SHORT TERM GOAL #5   Title  Incr. endurance/activity tolerance so pt. able to perform 10' nonstop on recumbent bike (Scifit)                   Baseline  Not tested due to time constraint - 06-30-17  Status  On-going    Target Date  07/26/17        PT Long Term Goals - 07/31/17 1454      PT LONG  TERM GOAL #1   Title  Pt will transfer sit to stand from wheelchair to RW with CGA.    Baseline  07/31/17; needed at least min assist to mod assist depending on fatigue    Time  --    Period  --    Status  Not Met      PT LONG TERM GOAL #2   Title  Stand at countertop modified independently for at least 5" per pt report to demo increased LE strength and improved standing tolerance    Baseline  5/0/19: able to stand for 5 minutes at sink with supervision degressing to min guard assist as pt fatigued for safety    Status  Partially Met      PT LONG TERM GOAL #5   Title  Pt will tolerate rolling prone and maintaining position for at least 10 mins for stretching hip flexors.      Baseline  07/31/17: met today    Time  --    Period  --    Status  Achieved      PT LONG TERM GOAL #6   Title  Pt will amb. 30' at home with RW with min assist +1    Baseline  07/31/17; unable to assess due to time contraints today    Time  --    Period  --    Status  Unable to assess            Plan - 07/31/17 1453    Clinical Impression Statement  Today's skilled session focused on progress toward LTGs with pt meeting some and progressing toward others. Pt should benefit from continued PT to progress toward unmet goals.     Rehab Potential  Good    Clinical Impairments Affecting Rehab Potential  severity of impairments and length of time since onset    PT Frequency  2x / week    PT Duration  8 weeks    PT Treatment/Interventions  ADLs/Self Care Home Management;Therapeutic exercise;Balance training;Therapeutic activities;Functional mobility training;Gait training;Neuromuscular re-education;Patient/family education;DME Instruction;Wheelchair mobility training;Passive range of motion    PT Next Visit Plan  Primary PT to renew; continue to work on transfers, stretching and short distance gait with 90* turns    PT Home Exercise Plan  stretches and LE strengthening, continue to work on sit/stand transfers, gait  with RW. standing balance activities    Consulted and Agree with Plan of Care  Patient       Patient will benefit from skilled therapeutic intervention in order to improve the following deficits and impairments:  Abnormal gait, Decreased activity tolerance, Decreased balance, Decreased coordination, Decreased range of motion, Decreased mobility, Decreased endurance, Decreased strength, Impaired flexibility, Increased muscle spasms, Pain, Impaired tone  Visit Diagnosis: Other abnormalities of gait and mobility  Muscle weakness (generalized)  Paraplegia, incomplete ([place])     Problem List Patient Active Problem List   Diagnosis Date Noted  . Nasal congestion 04/05/2017  . Breast pain 01/26/2017  . Upper respiratory infection, viral 01/26/2017  . Counseling regarding advanced care planning and goals of care 01/02/2017  . Bilateral rotator cuff syndrome 02/12/2016  . E. coli UTI 08/01/2015  . Constipation due to neurogenic bowel 08/01/2015  . Muscle spasticity   . 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 Spastic paraparesis   .  Neurogenic bladder   . History of pulmonary embolism   . Post-operative pain   . Metastatic cancer ([place])   . Steroid-induced hyperglycemia   . Depression   . Obstipation   . Thoracic spine tumor 07/06/2015  . 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 Cord compression ([place])   . Postoperative hypothyroidism   . Secondary malignant neoplasm of vertebral column ([place]) 03/08/2015  . Spasticity 09/19/2014  . Dysuria 09/04/2014  . Hypertension 05/01/2014  . Ingrown right big toenail 05/01/2014  . GERD (gastroesophageal reflux disease) 02/14/2014  . Dysphagia, pharyngoesophageal phase 02/06/2014  . Type 2 diabetes mellitus without complication ([place]) 05/39/7673  . Constipation   . Neurogenic bowel   . 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 ([place]) 01/20/2014  . Metastatic cancer to spine ([place]) 01/19/2014  . Rotator cuff tendonitis   . Hurthle cell neoplasm  of thyroid 12/30/2013  . 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 Leg weakness, bilateral 12/13/2013    Willow Ora, PTA, [facility] Outpatient Neuro [facility] [address] [place], [place] [address] [phone] 07/31/17, 11:46 PM   Name: LAYCEE FITZSIMMONS MRN: [id number] Date of Birth: 07/06/1965

## 2017-08-03 ENCOUNTER — Ambulatory Visit: Payer: Medicare Other | Admitting: Physical Therapy

## 2017-08-05 ENCOUNTER — Ambulatory Visit: Payer: Medicare Other | Admitting: Physical Therapy

## 2017-08-05 ENCOUNTER — Encounter: Payer: Self-pay | Admitting: Physical Therapy

## 2017-08-05 DIAGNOSIS — G8222 Paraplegia, incomplete: Secondary | ICD-10-CM

## 2017-08-05 DIAGNOSIS — M6281 Muscle weakness (generalized): Secondary | ICD-10-CM | POA: Diagnosis not present

## 2017-08-05 DIAGNOSIS — R2689 Other abnormalities of gait and mobility: Secondary | ICD-10-CM | POA: Diagnosis not present

## 2017-08-05 NOTE — Addendum Note (Signed)
Addended by: Lamar Benes on: 08/05/2017 09:04 AM   Modules accepted: Orders

## 2017-08-06 ENCOUNTER — Ambulatory Visit: Payer: Medicare Other | Admitting: Physical Therapy

## 2017-08-06 NOTE — Therapy (Signed)
[facility] [address] [place], [place], [address] Phone: [phone]   Fax:  [phone]  Physical Therapy Treatment  Patient Details  Name: Sarah [name] MRN: [id number] Date of Birth: January 05, 1966 Referring Provider: Dr. Alger Simons   Encounter Date: 08/05/2017  PT End of Session - 08/05/17 1107    Visit Number  9    Number of Visits  17    Date for PT Re-Evaluation  07/25/17    Authorization Type  UHC Medicare    Authorization Time Period  05-26-17 - 07-25-17    PT Start Time  1104    PT Stop Time  1143    PT Time Calculation (min)  39 min    Equipment Utilized During Treatment  Gait belt    Activity Tolerance  Patient tolerated treatment well;Patient limited by fatigue    Behavior During Therapy  [facility] for tasks assessed/performed       Past Medical History:  Diagnosis Date  . 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 Chronic back pain   . DDD (degenerative disc disease), cervical   . DJD (degenerative joint disease)   . History of rectal fissure   . HIT (heparin-induced thrombocytopenia) ([place])   . Hypertension   . Obesity   . 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)   . Pulmonary embolus, right ([place]) 2015  . Rotator cuff tendonitis right    Past Surgical History:  Procedure Laterality Date  . LAMINECTOMY N/A 12/14/2013   Procedure: THORACIC LAMINECTOMY FOR TUMOR THORACIC THREE;  Surgeon: Ashok Pall, MD;  Location: [place] [facility];  Service: Neurosurgery;  Laterality: N/A;  THORACIC LAMINECTOMY FOR TUMOR THORACIC THREE  . LAMINECTOMY N/A 07/05/2015   Procedure: THORACIC LAMINECTOMY FOR TUMOR;  Surgeon: Ashok Pall, MD;  Location: [place] NEURO ORS;  Service: Neurosurgery;  Laterality: N/A;  . POSTERIOR LUMBAR FUSION 4 LEVEL N/A 12/30/2013   Procedure: Thoracic one-Thoracic five posterior thoracic fusion with pedicle screws;  Surgeon: Ashok Pall, MD;  Location: [place] NEURO ORS;   Service: Neurosurgery;  Laterality: N/A;  Thoracic one-Thoracic five posterior thoracic fusion with pedicle screws  . THYROIDECTOMY N/A 01/09/2014   Procedure: TOTAL THYROIDECTOMY;  Surgeon: Izora Gala, MD;  Location: [facility];  Service: ENT;  Laterality: N/A;  . TONSILLECTOMY      There were no vitals filed for this visit.  Subjective Assessment - 08/05/17 1104    Subjective  No new complaints. Has been standing and walking in place at home by herself holding the RW next to the sink. No falls. Continues with chronic back pain, did not take tylenol today due to upset stomach.     Pertinent History  thoracic laminectomy for spinal tumor 12-14-13; 07-05-15 for 2nd sx due to tumor regrowth:  DDD; Rt RTC tendonitis; DDD and DJD:  chronic back pain    Patient Stated Goals  get legs stronger and to walk    Currently in Pain?  Yes    Pain Score  2     Pain Location  Back    Pain Orientation  Lower    Pain Descriptors / Indicators  Aching;Sore    Pain Type  Chronic pain    Pain Onset  More than a month ago    Pain Frequency  Constant    Aggravating Factors   prolonged sitting, immobility    Pain Relieving Factors  tylenol  [place] Adult PT Treatment/Exercise - 08/05/17 1110      Transfers   Transfers  Sit to Stand;Stand to Sit    Sit to Stand  4: Min guard;With upper extremity assist;3: Mod assist;From chair/3-in-1    Stand to Sit  4: Min guard;With upper extremity assist;To chair/3-in-1      Ambulation/Gait   Ambulation/Gait  Yes    Ambulation/Gait Assistance  4: Min guard;4: Min assist    Ambulation/Gait Assistance Details  worked on short distance gait with RW between 2 targets with 90* turns at the end. min guard assist through out straight pathways, min guard to min assist for turns. cues on posture and sequencing as well.     Ambulation Distance (Feet)  20 Feet x2, 32 x1    Assistive device  Rolling walker    Gait Pattern  Step-through pattern;Decreased stride length;Trunk  flexed    Ambulation Surface  Level;Indoor      Self-Care   Self-Care  Other Self-Care Comments    Other Self-Care Comments   reviewed goals of recert with pt agreeing to them and 4 week POC to get pt more independant at home with family; discussed use of gait belt at home with gait with family and provided pt with resources to look into getting one.             PT Short Term Goals - 07/01/17 0800      PT SHORT TERM GOAL #1   Title  Perform sit to stand transfer from wheelchair to RW with +1 min assist.    Baseline  +1 ony on 06-30-17    Status  Achieved      PT SHORT TERM GOAL #2   Title  Amb. with RW 25' with +2 min assist for incr. household accessibility.    Baseline  met on 06-30-17 ; furthest distance to date 7' in 1 rep    Status  Achieved      PT SHORT TERM GOAL #3   Title  Independent in HEP for LE strengthening and stretching.                            Status  Achieved      PT SHORT TERM GOAL #4   Title  Pt will stand at counter/ sink for 3" with bil. UE support with +1 min assist.    Baseline  pt reports standing at home - Not tested on 06-30-17 due to time constraint    Status  On-going    Target Date  07/26/17      PT SHORT TERM GOAL #5   Title  Incr. endurance/activity tolerance so pt. able to perform 10' nonstop on recumbent bike (Scifit)                   Baseline  Not tested due to time constraint - 06-30-17    Status  On-going    Target Date  07/26/17        PT Long Term Goals - 08/05/17 1108      PT LONG TERM GOAL #1   Title  Pt will transfer sit to stand from wheelchair to RW with CGA.  (all goals due 09/05/17)    Baseline  07/31/17; needed at least min assist to mod assist depending on fatigue    Time  4    Period  Weeks    Status  Not Met  PT LONG TERM GOAL #2   Title  Stand at countertop modified independently for at least 5" per pt report to demo increased LE strength and improved standing tolerance (GOAL ONGOING)    Baseline  07/31/17: able  to stand for 5 minutes at sink with supervision degressing to min guard assist as pt fatigued for safety    Time  4    Period  Weeks    Status  Partially Met      PT LONG TERM GOAL #3   Title  Amb. with RW 25' in home with +1 min assist.     Baseline  Pt currently not amb. in home    Time  4    Period  Weeks    Status  New            Plan - 08/05/17 1107    Clinical Impression Statement  Today's skilled session continued to focus on short distance gait with 90 degree turns for carryover to home with family assistance. Less assistance was needed with pivot turns today. Also worked on overall increasing distance before sitting with chair follow for safety. Pt is progressing toward goals and should benefit from continued PT to progress toward unmet goals.     Rehab Potential  Good    Clinical Impairments Affecting Rehab Potential  severity of impairments and length of time since onset    PT Frequency  2x / week    PT Duration  8 weeks    PT Treatment/Interventions  ADLs/Self Care Home Management;Therapeutic exercise;Balance training;Therapeutic activities;Functional mobility training;Gait training;Neuromuscular re-education;Patient/family education;DME Instruction;Wheelchair mobility training;Passive range of motion    PT Next Visit Plan  continue to work on transfers, stretching and short distance gait with 90* turns    PT Home Exercise Plan  stretches and LE strengthening, continue to work on sit/stand transfers, gait with RW. standing balance activities    Consulted and Agree with Plan of Care  Patient       Patient will benefit from skilled therapeutic intervention in order to improve the following deficits and impairments:  Abnormal gait, Decreased activity tolerance, Decreased balance, Decreased coordination, Decreased range of motion, Decreased mobility, Decreased endurance, Decreased strength, Impaired flexibility, Increased muscle spasms, Pain, Impaired tone  Visit  Diagnosis: Other abnormalities of gait and mobility  Muscle weakness (generalized)  Paraplegia, incomplete ([place])     Problem List Patient Active Problem List   Diagnosis Date Noted  . Nasal congestion 04/05/2017  . Breast pain 01/26/2017  . Upper respiratory infection, viral 01/26/2017  . Counseling regarding advanced care planning and goals of care 01/02/2017  . Bilateral rotator cuff syndrome 02/12/2016  . E. coli UTI 08/01/2015  . Constipation due to neurogenic bowel 08/01/2015  . Muscle spasticity   . 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 Spastic paraparesis   . Neurogenic bladder   . History of pulmonary embolism   . Post-operative pain   . Metastatic cancer ([place])   . Steroid-induced hyperglycemia   . Depression   . Obstipation   . Thoracic spine tumor 07/06/2015  . 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 Cord compression ([place])   . Postoperative hypothyroidism   . Secondary malignant neoplasm of vertebral column ([place]) 03/08/2015  . Spasticity 09/19/2014  . Dysuria 09/04/2014  . Hypertension 05/01/2014  . Ingrown right big toenail 05/01/2014  . GERD (gastroesophageal reflux disease) 02/14/2014  . Dysphagia, pharyngoesophageal phase 02/06/2014  . Type 2 diabetes mellitus without complication ([place]) 51/70/0174  .  Constipation   . Neurogenic bowel   . 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 ([place]) 01/20/2014  . Metastatic cancer to spine ([place]) 01/19/2014  . Rotator cuff tendonitis   . 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 Leg weakness, bilateral 12/13/2013    Willow Ora, PTA, [facility] Outpatient Neuro [facility] [address] [place], [place] [address] [phone] 08/06/17, 12:58 PM   Name: Sarah [name] MRN: [id number] Date of Birth: 08-Nov-1965

## 2017-08-07 LAB — THYROGLOBULIN LEVEL: Thyroglobulin: 40 ng/mL — ABNORMAL HIGH

## 2017-08-09 ENCOUNTER — Encounter (HOSPITAL_COMMUNITY): Payer: Self-pay | Admitting: Emergency Medicine

## 2017-08-09 ENCOUNTER — Other Ambulatory Visit: Payer: Self-pay

## 2017-08-09 ENCOUNTER — Inpatient Hospital Stay (HOSPITAL_COMMUNITY)
Admission: EM | Admit: 2017-08-09 | Discharge: 2017-08-13 | DRG: 445 | Disposition: A | Discharge status: Home Health | Payer: Medicare Other | Attending: Internal Medicine | Admitting: Internal Medicine

## 2017-08-09 ENCOUNTER — Emergency Department (HOSPITAL_COMMUNITY): Payer: Medicare Other

## 2017-08-09 DIAGNOSIS — C7951 Secondary malignant neoplasm of bone: Secondary | ICD-10-CM | POA: Diagnosis present

## 2017-08-09 DIAGNOSIS — N179 Acute kidney failure, unspecified: Secondary | ICD-10-CM | POA: Diagnosis present

## 2017-08-09 DIAGNOSIS — K219 Gastro-esophageal reflux disease without esophagitis: Secondary | ICD-10-CM | POA: Diagnosis present

## 2017-08-09 DIAGNOSIS — Z86711 Personal history of pulmonary embolism: Secondary | ICD-10-CM

## 2017-08-09 DIAGNOSIS — Z8249 Family history of ischemic heart disease and other diseases of the circulatory system: Secondary | ICD-10-CM | POA: Diagnosis not present

## 2017-08-09 DIAGNOSIS — R1013 Epigastric pain: Secondary | ICD-10-CM | POA: Diagnosis not present

## 2017-08-09 DIAGNOSIS — D6959 Other secondary thrombocytopenia: Secondary | ICD-10-CM | POA: Diagnosis present

## 2017-08-09 DIAGNOSIS — R112 Nausea with vomiting, unspecified: Secondary | ICD-10-CM | POA: Diagnosis present

## 2017-08-09 DIAGNOSIS — Z7901 Long term (current) use of anticoagulants: Secondary | ICD-10-CM | POA: Diagnosis not present

## 2017-08-09 DIAGNOSIS — T451X5A Adverse effect of antineoplastic and immunosuppressive drugs, initial encounter: Secondary | ICD-10-CM | POA: Diagnosis present

## 2017-08-09 DIAGNOSIS — Z79890 Hormone replacement therapy (postmenopausal): Secondary | ICD-10-CM | POA: Diagnosis not present

## 2017-08-09 DIAGNOSIS — K828 Other specified diseases of gallbladder: Secondary | ICD-10-CM

## 2017-08-09 DIAGNOSIS — E119 Type 2 diabetes mellitus without complications: Secondary | ICD-10-CM | POA: Diagnosis present

## 2017-08-09 DIAGNOSIS — F329 Major depressive disorder, single episode, unspecified: Secondary | ICD-10-CM | POA: Diagnosis present

## 2017-08-09 DIAGNOSIS — E890 Postprocedural hypothyroidism: Secondary | ICD-10-CM | POA: Diagnosis present

## 2017-08-09 DIAGNOSIS — D696 Thrombocytopenia, unspecified: Secondary | ICD-10-CM | POA: Diagnosis not present

## 2017-08-09 DIAGNOSIS — I10 Essential (primary) hypertension: Secondary | ICD-10-CM | POA: Diagnosis present

## 2017-08-09 DIAGNOSIS — I2782 Chronic pulmonary embolism: Secondary | ICD-10-CM

## 2017-08-09 DIAGNOSIS — F32A Depression, unspecified: Secondary | ICD-10-CM | POA: Diagnosis present

## 2017-08-09 DIAGNOSIS — C73 Malignant neoplasm of thyroid gland: Secondary | ICD-10-CM | POA: Diagnosis present

## 2017-08-09 DIAGNOSIS — R17 Unspecified jaundice: Secondary | ICD-10-CM | POA: Diagnosis not present

## 2017-08-09 DIAGNOSIS — E860 Dehydration: Secondary | ICD-10-CM | POA: Diagnosis present

## 2017-08-09 DIAGNOSIS — Z981 Arthrodesis status: Secondary | ICD-10-CM

## 2017-08-09 DIAGNOSIS — R531 Weakness: Secondary | ICD-10-CM | POA: Diagnosis not present

## 2017-08-09 DIAGNOSIS — C7800 Secondary malignant neoplasm of unspecified lung: Secondary | ICD-10-CM | POA: Diagnosis not present

## 2017-08-09 DIAGNOSIS — N289 Disorder of kidney and ureter, unspecified: Secondary | ICD-10-CM | POA: Diagnosis not present

## 2017-08-09 DIAGNOSIS — R1011 Right upper quadrant pain: Secondary | ICD-10-CM | POA: Diagnosis not present

## 2017-08-09 DIAGNOSIS — K8020 Calculus of gallbladder without cholecystitis without obstruction: Secondary | ICD-10-CM | POA: Diagnosis not present

## 2017-08-09 DIAGNOSIS — Z833 Family history of diabetes mellitus: Secondary | ICD-10-CM | POA: Diagnosis not present

## 2017-08-09 DIAGNOSIS — Z823 Family history of stroke: Secondary | ICD-10-CM

## 2017-08-09 DIAGNOSIS — D701 Agranulocytosis secondary to cancer chemotherapy: Secondary | ICD-10-CM | POA: Diagnosis not present

## 2017-08-09 DIAGNOSIS — C778 Secondary and unspecified malignant neoplasm of lymph nodes of multiple regions: Secondary | ICD-10-CM | POA: Diagnosis not present

## 2017-08-09 DIAGNOSIS — R110 Nausea: Secondary | ICD-10-CM | POA: Diagnosis not present

## 2017-08-09 DIAGNOSIS — R509 Fever, unspecified: Secondary | ICD-10-CM | POA: Diagnosis present

## 2017-08-09 LAB — GLUCOSE, CAPILLARY
Glucose-Capillary: 107 mg/dL — ABNORMAL HIGH (ref 65–99)
Glucose-Capillary: 182 mg/dL — ABNORMAL HIGH (ref 65–99)

## 2017-08-09 LAB — MAGNESIUM: Magnesium: 1.7 mg/dL (ref 1.7–2.4)

## 2017-08-09 LAB — COMPREHENSIVE METABOLIC PANEL
ALT: 10 U/L — ABNORMAL LOW (ref 14–54)
ANION GAP: 8 (ref 5–15)
AST: 19 U/L (ref 15–41)
Albumin: 2.7 g/dL — ABNORMAL LOW (ref 3.5–5.0)
Alkaline Phosphatase: 125 U/L (ref 38–126)
BUN: 18 mg/dL (ref 6–20)
CHLORIDE: 105 mmol/L (ref 101–111)
CO2: 26 mmol/L (ref 22–32)
Calcium: 7.8 mg/dL — ABNORMAL LOW (ref 8.9–10.3)
Creatinine, Ser: 1.21 mg/dL — ABNORMAL HIGH (ref 0.44–1.00)
GFR calc non Af Amer: 51 mL/min — ABNORMAL LOW (ref >60)
GFR, EST AFRICAN AMERICAN: 59 mL/min — AB (ref >60)
Glucose, Bld: 120 mg/dL — ABNORMAL HIGH (ref 65–99)
POTASSIUM: 3.8 mmol/L (ref 3.5–5.1)
Sodium: 139 mmol/L (ref 135–145)
Total Bilirubin: 2.1 mg/dL — ABNORMAL HIGH (ref 0.3–1.2)
Total Protein: 5.3 g/dL — ABNORMAL LOW (ref 6.5–8.1)

## 2017-08-09 LAB — CBC WITH DIFFERENTIAL/PLATELET
Basophils Absolute: 0 10*3/uL (ref 0.0–0.1)
Basophils Relative: 0 %
EOS ABS: 0 10*3/uL (ref 0.0–0.7)
Eosinophils Relative: 0 %
HCT: 35.2 % — ABNORMAL LOW (ref 36.0–46.0)
Hemoglobin: 11.3 g/dL — ABNORMAL LOW (ref 12.0–15.0)
Lymphocytes Relative: 7 %
Lymphs Abs: 0.9 10*3/uL (ref 0.7–4.0)
MCH: 29.7 pg (ref 26.0–34.0)
MCHC: 32.1 g/dL (ref 30.0–36.0)
MCV: 92.6 fL (ref 78.0–100.0)
MONO ABS: 0.5 10*3/uL (ref 0.1–1.0)
Monocytes Relative: 4 %
NEUTROS PCT: 89 %
Neutro Abs: 11.4 10*3/uL — ABNORMAL HIGH (ref 1.7–7.7)
PLATELETS: 82 10*3/uL — AB (ref 150–400)
RBC: 3.8 MIL/uL — AB (ref 3.87–5.11)
RDW: 15 % (ref 11.5–15.5)
WBC: 12.8 10*3/uL — ABNORMAL HIGH (ref 4.0–10.5)

## 2017-08-09 LAB — TROPONIN I

## 2017-08-09 LAB — TSH: TSH: 0.01 u[IU]/mL — ABNORMAL LOW (ref 0.350–4.500)

## 2017-08-09 LAB — LACTIC ACID, PLASMA
LACTIC ACID, VENOUS: 1.3 mmol/L (ref 0.5–1.9)
LACTIC ACID, VENOUS: 1.1 mmol/L (ref 0.5–1.9)

## 2017-08-09 LAB — PHOSPHORUS: PHOSPHORUS: 3.3 mg/dL (ref 2.5–4.6)

## 2017-08-09 LAB — LIPASE, BLOOD: Lipase: 17 U/L (ref 11–51)

## 2017-08-09 MED ORDER — BACLOFEN 10 MG PO TABS
5.0000 mg | ORAL_TABLET | Freq: Three times a day (TID) | ORAL | Status: DC
  Administered 2017-08-09 – 2017-08-13 (×10): 5 mg via ORAL
  Filled 2017-08-09 (×13): qty 1

## 2017-08-09 MED ORDER — RIVAROXABAN 20 MG PO TABS
20.0000 mg | ORAL_TABLET | Freq: Every day | ORAL | Status: DC
  Administered 2017-08-09 – 2017-08-12 (×4): 20 mg via ORAL
  Filled 2017-08-09 (×5): qty 1

## 2017-08-09 MED ORDER — MORPHINE SULFATE (PF) 4 MG/ML IV SOLN
4.0000 mg | Freq: Once | INTRAVENOUS | Status: AC
  Administered 2017-08-09: 4 mg via INTRAVENOUS
  Filled 2017-08-09: qty 1

## 2017-08-09 MED ORDER — LEVOTHYROXINE SODIUM 137 MCG PO TABS
137.0000 ug | ORAL_TABLET | Freq: Every day | ORAL | Status: DC
  Administered 2017-08-10 – 2017-08-13 (×4): 137 ug via ORAL
  Filled 2017-08-09 (×5): qty 1

## 2017-08-09 MED ORDER — FLUTICASONE PROPIONATE 50 MCG/ACT NA SUSP
2.0000 | Freq: Every day | NASAL | Status: DC
  Administered 2017-08-10 – 2017-08-13 (×4): 2 via NASAL
  Filled 2017-08-09 (×2): qty 16

## 2017-08-09 MED ORDER — SENNA 8.6 MG PO TABS
2.0000 | ORAL_TABLET | Freq: Every day | ORAL | Status: DC
  Administered 2017-08-09 – 2017-08-13 (×5): 17.2 mg via ORAL
  Filled 2017-08-09 (×6): qty 2

## 2017-08-09 MED ORDER — SERTRALINE HCL 50 MG PO TABS
25.0000 mg | ORAL_TABLET | Freq: Every day | ORAL | Status: DC
  Administered 2017-08-09 – 2017-08-13 (×5): 25 mg via ORAL
  Filled 2017-08-09 (×6): qty 1

## 2017-08-09 MED ORDER — PANTOPRAZOLE SODIUM 40 MG PO TBEC
40.0000 mg | DELAYED_RELEASE_TABLET | Freq: Every day | ORAL | Status: DC
  Administered 2017-08-09 – 2017-08-13 (×5): 40 mg via ORAL
  Filled 2017-08-09 (×6): qty 1

## 2017-08-09 MED ORDER — ONDANSETRON HCL 4 MG/2ML IJ SOLN
4.0000 mg | Freq: Once | INTRAMUSCULAR | Status: AC
  Administered 2017-08-09: 4 mg via INTRAVENOUS
  Filled 2017-08-09: qty 2

## 2017-08-09 MED ORDER — METOCLOPRAMIDE HCL 5 MG/ML IJ SOLN
10.0000 mg | Freq: Once | INTRAMUSCULAR | Status: AC
  Administered 2017-08-09: 10 mg via INTRAVENOUS
  Filled 2017-08-09: qty 2

## 2017-08-09 MED ORDER — ACETAMINOPHEN 650 MG RE SUPP: 650.0000 mg | Freq: Four times a day (QID) | RECTAL | Status: DC | PRN

## 2017-08-09 MED ORDER — FAMOTIDINE IN NACL 20-0.9 MG/50ML-% IV SOLN
20.0000 mg | Freq: Every day | INTRAVENOUS | Status: DC
  Administered 2017-08-09 – 2017-08-12 (×4): 20 mg via INTRAVENOUS
  Filled 2017-08-09 (×4): qty 50

## 2017-08-09 MED ORDER — PROMETHAZINE HCL 25 MG/ML IJ SOLN
25.0000 mg | Freq: Three times a day (TID) | INTRAMUSCULAR | Status: DC | PRN
  Administered 2017-08-09 – 2017-08-10 (×3): 25 mg via INTRAVENOUS
  Filled 2017-08-09 (×3): qty 1

## 2017-08-09 MED ORDER — SODIUM CHLORIDE 0.9 % IV SOLN
INTRAVENOUS | Status: DC
  Administered 2017-08-09 – 2017-08-12 (×8): via INTRAVENOUS

## 2017-08-09 MED ORDER — SODIUM CHLORIDE 0.9 % IV BOLUS
1000.0000 mL | Freq: Once | INTRAVENOUS | Status: AC
  Administered 2017-08-09: 1000 mL via INTRAVENOUS

## 2017-08-09 MED ORDER — ACETAMINOPHEN 325 MG PO TABS
650.0000 mg | ORAL_TABLET | Freq: Four times a day (QID) | ORAL | Status: DC | PRN
  Administered 2017-08-10 – 2017-08-12 (×6): 650 mg via ORAL
  Filled 2017-08-09 (×6): qty 2

## 2017-08-09 MED ORDER — ONDANSETRON HCL 4 MG/2ML IJ SOLN: 4.0000 mg | Freq: Four times a day (QID) | INTRAMUSCULAR | Status: DC | PRN

## 2017-08-09 MED ORDER — INSULIN ASPART 100 UNIT/ML ~~LOC~~ SOLN
0.0000 [IU] | Freq: Three times a day (TID) | SUBCUTANEOUS | Status: DC
  Administered 2017-08-11: 1 [IU] via SUBCUTANEOUS

## 2017-08-09 MED ORDER — ONDANSETRON HCL 4 MG PO TABS: 4.0000 mg | ORAL_TABLET | Freq: Four times a day (QID) | ORAL | Status: DC | PRN

## 2017-08-09 MED ORDER — SODIUM CHLORIDE 0.9 % IV BOLUS
500.0000 mL | Freq: Once | INTRAVENOUS | Status: AC
  Administered 2017-08-09: 500 mL via INTRAVENOUS

## 2017-08-09 NOTE — ED Notes (Signed)
Bed marked ready Call for report "nurse isn't ready" will call back

## 2017-08-09 NOTE — Progress Notes (Signed)
Patient arrived to room 338.  Dr. Dyann Kief notified via text page.

## 2017-08-09 NOTE — ED Notes (Signed)
Pt complains of continued pain and N  She is pale and quiet  Family at bedside

## 2017-08-09 NOTE — ED Triage Notes (Signed)
Pt presents today for nausea x 2 days. Took zofran at home with no relief. Pt denies diarrhea. Pt take oral chemo for thyroid CA with mets. Pt denies being around anyone with similar symptoms.

## 2017-08-09 NOTE — ED Notes (Signed)
Report to Olean Ree RN

## 2017-08-09 NOTE — ED Notes (Signed)
Pt continues to complain of N

## 2017-08-09 NOTE — ED Provider Notes (Signed)
[facility] EMERGENCY DEPARTMENT Provider Note   CSN: [id number] Arrival date & time: 08/09/17  8127     History   Chief Complaint Chief Complaint  Patient presents with  . Nausea    HPI Sarah [name] is a 52 y.o. female.  HPI  Sarah [name] is a 52 y.o. 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  Also complains of upper abdominal pain.  Pain is nonradiating.  Describes pain as constant and aching.  Unable to tolerate solid foods.  Reports multiple episodes of vomiting last evening.  She denies fever, chills, hematemesis, dysuria.  No chest pain or shortness of breath.    Past Medical History:  Diagnosis Date  . 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 Chronic back pain   . DDD (degenerative disc disease), cervical   . DJD (degenerative joint disease)   . History of rectal fissure   . HIT (heparin-induced thrombocytopenia) ([place])   . Hypertension   . Obesity   . 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 Pulmonary embolus, right ([place]) 2015  . Rotator cuff tendonitis right    Patient Active Problem List   Diagnosis Date Noted  . Nasal congestion 04/05/2017  . Breast pain 01/26/2017  . Upper respiratory infection, viral 01/26/2017  . Counseling regarding advanced care planning and goals of care 01/02/2017  . Bilateral rotator cuff syndrome 02/12/2016  . E. coli UTI 08/01/2015  . Constipation due to neurogenic bowel 08/01/2015  . Muscle spasticity   . 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 Spastic paraparesis   . Neurogenic bladder   . History of pulmonary embolism   . Post-operative pain   . Metastatic cancer ([place])   . Steroid-induced hyperglycemia   . Depression   . Obstipation   . Thoracic spine tumor 07/06/2015  . 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 ([place]) 07/06/2015  .  Cord compression ([place])   . Postoperative hypothyroidism   . 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 Spasticity 09/19/2014  . Dysuria 09/04/2014  . Hypertension 05/01/2014  . Ingrown right big toenail 05/01/2014  . GERD (gastroesophageal reflux disease) 02/14/2014  . Dysphagia, pharyngoesophageal phase 02/06/2014  . Type 2 diabetes mellitus without complication ([place]) 51/70/0174  . Constipation   . Neurogenic bowel   . 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 Metastatic cancer to spine ([place]) 01/19/2014  . Rotator cuff tendonitis   . 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 Leg weakness, bilateral 12/13/2013    Past Surgical History:  Procedure Laterality Date  . LAMINECTOMY N/A 12/14/2013   Procedure: THORACIC LAMINECTOMY FOR TUMOR THORACIC THREE;  Surgeon: Ashok Pall, MD;  Location: [place] NEURO ORS;  Service: Neurosurgery;  Laterality: N/A;  THORACIC LAMINECTOMY FOR TUMOR THORACIC THREE  . LAMINECTOMY N/A 07/05/2015   Procedure: THORACIC LAMINECTOMY FOR TUMOR;  Surgeon: Ashok Pall, MD;  Location: [place] NEURO ORS;  Service: Neurosurgery;  Laterality: N/A;  . POSTERIOR LUMBAR FUSION 4 LEVEL N/A 12/30/2013   Procedure: Thoracic one-Thoracic five posterior thoracic fusion with pedicle screws;  Surgeon: Ashok Pall, MD;  Location: [place] NEURO ORS;  Service: Neurosurgery;  Laterality: N/A;  Thoracic one-Thoracic five posterior thoracic fusion with pedicle screws  . THYROIDECTOMY N/A 01/09/2014   Procedure: TOTAL  THYROIDECTOMY;  Surgeon: Izora Gala, MD;  Location: [place];  Service: ENT;  Laterality: N/A;  . TONSILLECTOMY       OB History   None      Home Medications    Prior to Admission medications   Medication Sig Start Date End Date Taking? Authorizing Provider  baclofen (LIORESAL) 20 MG tablet TAKE (1) TABLET THREE TIMES DAILY. 07/23/17  Yes [name], MD  colchicine 0.6 MG tablet  TAKE&nbsp;&nbsp;(1)&nbsp;&nbsp;TABLET TWICE A DAY AS NEEDED. 07/16/17  Yes [name], MD  fluticasone [facility]) 50 MCG/ACT nasal spray Place 2 sprays into both nostrils daily. 04/01/17  Yes Carlyle Dolly, MD  furosemide (LASIX) 20 MG tablet Take 1 tablet (20 mg total) by mouth daily as needed. Patient taking differently: Take 20 mg by mouth daily as needed for fluid.  09/05/16  Yes Brunetta Genera, MD  LENVIMA 24 MG DAILY DOSE 10 (2) & 4 MG capsule TAKE 24 MG (TWO 10 MG CAPSULES AND ONE 4MG  CAPSULE) BY MOUTH DAILY. 07/30/17  Yes Brunetta Genera, MD  levothyroxine (SYNTHROID, LEVOTHROID) 137 MCG tablet Take 137 mcg by mouth daily before breakfast.   Yes [provider]  lisinopril (PRINIVIL,ZESTRIL) 20 MG tablet Take 2 tablets (40 mg total) by mouth daily. 01/02/17  Yes Brunetta Genera, MD  NIFEdipine (PROCARDIA XL) 30 MG 24 hr tablet Take 1 tablet (30 mg total) by mouth daily. 04/13/17  Yes Brunetta Genera, MD  ondansetron (ZOFRAN) 4 MG tablet Take 1 tablet (4 mg total) by mouth every 6 (six) hours. 06/22/17  Yes [name], MD  pantoprazole (PROTONIX) 40 MG tablet Take 1 tablet (40 mg total) by mouth daily. 07/16/17  Yes [name], MD  rivaroxaban (XARELTO) 20 MG TABS tablet Take 1 tablet (20 mg total) by mouth daily. 04/14/17  Yes [name], MD  senna (SENOKOT) 8.6 MG TABS tablet Take 2 tablets (17.2 mg total) by mouth daily. 05/13/16  Yes Bayard Hugger, NP  sertraline (ZOLOFT) 25 MG tablet Take 1 tablet (25 mg total) by mouth daily. 05/06/17  Yes [name], MD    Family History Family History  Problem Relation Age of Onset  . Hypertension Mother   . Diabetes Mother   . Hypertension Father   . Stroke Father   . Diabetes Father   . Diabetes Sister   . Diabetes Sister     Social History Social History   Tobacco Use  . Smoking status: Never Smoker  . Smokeless tobacco: Never Used  Substance Use Topics  . Alcohol use: No   . Drug use: No     Allergies   Bee venom; Keflex [cephalexin]; Tramadol; Gabapentin; Heparin; Hydrochlorothiazide; Hydrocodone; and Oxycodone-acetaminophen   Review of Systems Review of Systems  Constitutional: Negative for appetite change, chills and fever.  Respiratory: Negative for shortness of breath.   Cardiovascular: Negative for chest pain.  Gastrointestinal: Positive for abdominal pain, nausea and vomiting. Negative for blood in stool and diarrhea.  Genitourinary: Negative for decreased urine volume, difficulty urinating, dysuria and flank pain.  Musculoskeletal: Negative for back pain.  Skin: Negative for color change and rash.  Neurological: Positive for weakness (Generalized weakness). Negative for dizziness, syncope, numbness and headaches.  Hematological: Negative for adenopathy.  All other systems reviewed and are negative.    Physical Exam Updated Vital Signs BP 107/72   Pulse 80   Temp 98.6 F (37 C)   Resp 17  Ht 5\' 11"  (1.803 m)   Wt 94.3 kg (208 lb)   LMP 10/22/2016   SpO2 97%   BMI 29.01 kg/m   Physical Exam  Constitutional: She is oriented to person, place, and time. No distress.  Ill-appearing  HENT:  Mucous membranes are dry  Eyes: No scleral icterus.  Neck: Normal range of motion.  Cardiovascular: Normal rate, regular rhythm and intact distal pulses.  Pulmonary/Chest: Effort normal and breath sounds normal. No respiratory distress.  Abdominal: Soft. She exhibits no distension and no mass. There is tenderness (Epigastric tenderness on exam, abdomen is soft no rebound tenderness or guarding.). There is no guarding.  Musculoskeletal: She exhibits no edema.  Neurological: She is alert and oriented to person, place, and time. No sensory deficit.  Skin: Skin is warm. No rash noted.  Psychiatric: She has a normal mood and affect.  Nursing note and vitals reviewed.    ED Treatments / Results  Labs (all labs ordered are listed, but only  abnormal results are displayed) Labs Reviewed  COMPREHENSIVE METABOLIC PANEL - Abnormal; Notable for the following components:      Result Value   Glucose, Bld 120 (*)    Creatinine, Ser 1.21 (*)    Calcium 7.8 (*)    Total Protein 5.3 (*)    Albumin 2.7 (*)    ALT 10 (*)    Total Bilirubin 2.1 (*)    GFR calc non Af Amer 51 (*)    GFR calc Af Amer 59 (*)    All other components within normal limits  CBC WITH DIFFERENTIAL/PLATELET - Abnormal; Notable for the following components:   WBC 12.8 (*)    RBC 3.80 (*)    Hemoglobin 11.3 (*)    HCT 35.2 (*)    Platelets 82 (*)    Neutro Abs 11.4 (*)    All other components within normal limits  URINE CULTURE  LIPASE, BLOOD  TROPONIN I  LACTIC ACID, PLASMA  LACTIC ACID, PLASMA  URINALYSIS, ROUTINE W REFLEX MICROSCOPIC    EKG EKG Interpretation  Date/Time:  Sunday Aug 09 2017 10:45:36 EDT Ventricular Rate:  91 PR Interval:    QRS Duration: 93 QT Interval:  385 QTC Calculation: 474 R Axis:   17 Text Interpretation:  Sinus rhythm Multiple premature complexes, vent & supraven Inferior infarct, old Confirmed by Nat Christen [phone]) on 08/09/2017 12:55:59 PM   Radiology Ct Abdomen Pelvis Wo Contrast  Result Date: 08/09/2017 CLINICAL DATA:  Per ED notes: Pt presents today for nausea x 2 days. Took zofran at home with no relief. Pt denies diarrhea. Pt take oral chemo for thyroid CA with mets. Pt denies being around anyone with similar symptoms. EXAM: CT ABDOMEN AND PELVIS WITHOUT CONTRAST TECHNIQUE: Multidetector CT imaging of the abdomen and pelvis was performed following the standard protocol without IV contrast. COMPARISON:  PET-CT 05/20/2017 and previous FINDINGS: Lower chest: Lower lobe pulmonary nodules up to 5 mm, stable since previous. No pleural or pericardial effusion. Hepatobiliary: Gallbladder is distended with small partially calcified stones in its dependent aspect. Unremarkable liver. Pancreas: Unremarkable. No pancreatic  ductal dilatation or surrounding inflammatory changes. Spleen: Normal in size without focal abnormality. Adrenals/Urinary Tract: Adrenal glands are unremarkable. Kidneys are normal, without renal calculi, focal lesion, or hydronephrosis. Bladder is unremarkable. Stomach/Bowel: Stomach is nondistended. Small bowel is nondilated. Colon is nondilated with a few scattered diverticula. Vascular/Lymphatic: No significant vascular findings are present. No enlarged abdominal or pelvic lymph nodes. Reproductive: Uterus and bilateral adnexa  are unremarkable. Other: No ascites.  No free air. Musculoskeletal: Spondylitic changes throughout the lumbar spine. Bilateral hip degenerative spurring. No fracture or worrisome bone lesion. IMPRESSION: 1. No acute findings. 2. Stable small pulmonary nodules at the visualized lung bases. Electronically Signed   By: Lucrezia Europe M.D.   On: 08/09/2017 13:54    Procedures Procedures (including critical care time)  Medications Ordered in ED Medications  sodium chloride 0.9 % bolus 1,000 mL (0 mLs Intravenous Stopped 08/09/17 1223)  metoCLOPramide (REGLAN) injection 10 mg (10 mg Intravenous Given 08/09/17 1047)  morphine 4 MG/ML injection 4 mg (4 mg Intravenous Given 08/09/17 1221)  ondansetron (ZOFRAN) injection 4 mg (4 mg Intravenous Given 08/09/17 1338)  sodium chloride 0.9 % bolus 500 mL (0 mLs Intravenous Stopped 08/09/17 1434)     Initial Impression / Assessment and Plan / ED Course  I have reviewed the triage vital signs and the nursing notes.  Pertinent labs & imaging results that were available during my care of the patient were reviewed by me and considered in my medical decision making (see chart for details).     Mucous membranes are dry, patient likely dehydrated secondary to vomiting.  Will check labs, rehydrate with IV fluids and give antiemetics and reassess.  Patient also seen by Dr. Lacinda Axon and care plan discussed.  Lactic acids reviewed.  Patient has been  hemodynamically stable.  Creatinine elevated, so CT of abdomen pelvis ordered without IV contrast.  No ultrasound available to further evaluate gallbladder.  Patient continues to have nausea despite antiemetics.  No active vomiting.  Given patient's extensive medical history, feel that she would benefit from admission and further evaluation of her gallbladder.  Will consult hospitalist for admission  1455 consulted Dr. Dyann Kief who agrees to admit.  Patient informed and agrees to plan    Final Clinical Impressions(s) / ED Diagnoses   Final diagnoses:  Intractable vomiting with nausea, unspecified vomiting type    ED Discharge Orders    None       Kem Parkinson, PA-C 08/09/17 1513    Nat Christen, MD 08/10/17 1540

## 2017-08-09 NOTE — H&P (Signed)
History and Physical    Sarah [name]:[id number] DOB: 10/17/1965 DOA: 08/09/2017  PCP: [name], MD   I have briefly reviewed patients previous medical reports in [facility].  Patient coming from: home  Chief Complaint: nausea, vomiting, epigastric pain  HPI: Sarah [name] is a 52 y/o with PMH significant for metastatic thyroid cancer (actively receiving chemotherapy), HTN, HIT, PE and depression; who presented  to ED with 2-3 days of abd pain, nausea and vomiting. Patient's abd pain: is localized in epigastric region, 8/10 in intensity, no radiated, stabbing in nature, present for the last 3 days and worsening. Pain is associated with nausea and vomiting; no blood content and currently unable to keep anything down. Patient denies fever, SOB, cough, hematuria, hematochezia, melena, HA's, focal weakness, CP or any other complaints.  ED Course: patient blood work demonstrated that she is dehydrated, CT scan w/o acute intestinal abnormalities, no free air. Found with AKI, normal lipase and elevated bilirubin. IVF"s, antiemetics and analgesics given.   Review of Systems:  All other systems reviewed and apart from HPI, are negative.  Past Medical History:  Diagnosis Date  . 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 Chronic back pain   . DDD (degenerative disc disease), cervical   . DJD (degenerative joint disease)   . History of rectal fissure   . HIT (heparin-induced thrombocytopenia) ([place])   . Hypertension   . Obesity   . 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 Pulmonary embolus, right ([place]) 2015  . Rotator cuff tendonitis right    Past Surgical History:  Procedure Laterality Date  . LAMINECTOMY N/A 12/14/2013   Procedure: THORACIC LAMINECTOMY FOR TUMOR THORACIC THREE;  Surgeon: Ashok Pall, MD;  Location: [place] NEURO ORS;  Service: Neurosurgery;  Laterality: N/A;  THORACIC LAMINECTOMY FOR TUMOR THORACIC THREE  . LAMINECTOMY N/A  07/05/2015   Procedure: THORACIC LAMINECTOMY FOR TUMOR;  Surgeon: Ashok Pall, MD;  Location: [place] NEURO ORS;  Service: Neurosurgery;  Laterality: N/A;  . POSTERIOR LUMBAR FUSION 4 LEVEL N/A 12/30/2013   Procedure: Thoracic one-Thoracic five posterior thoracic fusion with pedicle screws;  Surgeon: Ashok Pall, MD;  Location: [place] NEURO ORS;  Service: Neurosurgery;  Laterality: N/A;  Thoracic one-Thoracic five posterior thoracic fusion with pedicle screws  . THYROIDECTOMY N/A 01/09/2014   Procedure: TOTAL THYROIDECTOMY;  Surgeon: Izora Gala, MD;  Location: [place];  Service: ENT;  Laterality: N/A;  . TONSILLECTOMY      Social History  reports that she has never smoked. She has never used smokeless tobacco. She reports that she does not drink alcohol or use drugs.  Allergies  Allergen Reactions  . Bee Venom Anaphylaxis  . Keflex [Cephalexin] Nausea And Vomiting  . Tramadol Nausea And Vomiting  . Gabapentin Nausea And Vomiting  . Heparin Other (See Comments)    Weak positive platelets induced antibodies, SRA negative--2015  . Hydrochlorothiazide Nausea And Vomiting  . Hydrocodone Nausea And Vomiting  . Oxycodone-Acetaminophen Nausea And Vomiting    Family History  Problem Relation Age of Onset  . Hypertension Mother   . Diabetes Mother   . Hypertension Father   . Stroke Father   . Diabetes Father   . Diabetes Sister   . Diabetes Sister     Prior to Admission medications   Medication Sig Start Date End Date Taking? Authorizing Provider  baclofen (LIORESAL) 20 MG tablet TAKE (1) TABLET THREE TIMES DAILY. 07/23/17  Yes Naaman Plummer,  Celesta Gentile, MD  colchicine 0.6 MG tablet TAKE&nbsp;&nbsp;(1)&nbsp;&nbsp;TABLET TWICE A DAY AS NEEDED. 07/16/17  Yes [name], MD  fluticasone [facility]) 50 MCG/ACT nasal spray Place 2 sprays into both nostrils daily. 04/01/17  Yes Carlyle Dolly, MD  furosemide (LASIX) 20 MG tablet Take 1 tablet (20 mg total) by mouth daily as needed. Patient taking  differently: Take 20 mg by mouth daily as needed for fluid.  09/05/16  Yes Brunetta Genera, MD  LENVIMA 24 MG DAILY DOSE 10 (2) & 4 MG capsule TAKE 24 MG (TWO 10 MG CAPSULES AND ONE 4MG  CAPSULE) BY MOUTH DAILY. 07/30/17  Yes Brunetta Genera, MD  levothyroxine (SYNTHROID, LEVOTHROID) 137 MCG tablet Take 137 mcg by mouth daily before breakfast.   Yes [provider]  lisinopril (PRINIVIL,ZESTRIL) 20 MG tablet Take 2 tablets (40 mg total) by mouth daily. 01/02/17  Yes Brunetta Genera, MD  NIFEdipine (PROCARDIA XL) 30 MG 24 hr tablet Take 1 tablet (30 mg total) by mouth daily. 04/13/17  Yes Brunetta Genera, MD  ondansetron (ZOFRAN) 4 MG tablet Take 1 tablet (4 mg total) by mouth every 6 (six) hours. 06/22/17  Yes [name], MD  pantoprazole (PROTONIX) 40 MG tablet Take 1 tablet (40 mg total) by mouth daily. 07/16/17  Yes [name], MD  rivaroxaban (XARELTO) 20 MG TABS tablet Take 1 tablet (20 mg total) by mouth daily. 04/14/17  Yes [name], MD  senna (SENOKOT) 8.6 MG TABS tablet Take 2 tablets (17.2 mg total) by mouth daily. 05/13/16  Yes Bayard Hugger, NP  sertraline (ZOLOFT) 25 MG tablet Take 1 tablet (25 mg total) by mouth daily. 05/06/17  Yes [name], MD    Physical Exam: Vitals:   08/09/17 1430 08/09/17 1500 08/09/17 1515 08/09/17 1604  BP: 107/72 120/76  114/69  Pulse: 80 83 82 81  Resp: 17 19 18 20   Temp:    98.4 F (36.9 C)  TempSrc:    Oral  SpO2: 97% 98% 98% 99%  Weight:      Height:        Constitutional: afebrile, no CP, no SOB. Reporting nausea, no further vomiting. positive epigastric pain. Eyes: PERTLA, lids and conjunctivae normal, no icterus, no nystagmus ENMT: Mucous membranes are moist. Posterior pharynx clear of any exudate or lesions. No thrush. Neck: supple, no masses, no thyromegaly Respiratory: clear to auscultation bilaterally, no wheezing, no crackles. Normal respiratory effort. No accessory muscle use.    Cardiovascular: S1 & S2 heard, regular rate and rhythm, no murmurs / rubs / gallops. No extremity edema. 2+ pedal pulses. No carotid bruits.  Abdomen: No distension, positive tenderness in her epigastric region, worse with deep palpation; no masses palpated. No hepatosplenomegaly. Bowel sounds normal.  Musculoskeletal: no clubbing / cyanosis. No joint deformity upper and lower extremities. Decrease range of motion, slightly spastic muscle tone. Skin: multiple bruises, no open wounds, no induration.  Neurologic: CN 2-12 grossly intact. Sensation intact, DTR normal. Strength 4/5 in all 4 limbs due to poor effort.  Psychiatric: Normal judgment and insight. Alert and oriented x 3. Normal mood.    Labs on Admission: I have personally reviewed following labs and imaging studies  CBC: Recent Labs  Lab 08/09/17 1052  WBC 12.8*  NEUTROABS 11.4*  HGB 11.3*  HCT 35.2*  MCV 92.6  PLT 82*   Basic Metabolic Panel: Recent Labs  Lab 08/09/17 1052  NA 139  K 3.8  CL 105  CO2 26  GLUCOSE 120*  BUN 18  CREATININE 1.21*  CALCIUM 7.8*   Liver Function Tests: Recent Labs  Lab 08/09/17 1052  AST 19  ALT 10*  ALKPHOS 125  BILITOT 2.1*  PROT 5.3*  ALBUMIN 2.7*   Coagulation Profile: No results for input(s): INR, PROTIME in the last 168 hours. Cardiac Enzymes: Recent Labs  Lab 08/09/17 1052  TROPONINI <0.03   HbA1C: No results for input(s): HGBA1C in the last 72 hours. CBG: No results for input(s): GLUCAP in the last 168 hours. Urine analysis:    Component Value Date/Time   COLORURINE STRAW (A) 07/26/2016 2254   APPEARANCEUR CLEAR 07/26/2016 2254   LABSPEC 1.013 07/26/2016 2254   PHURINE 5.0 07/26/2016 2254   GLUCOSEU NEGATIVE 07/26/2016 2254   HGBUR NEGATIVE 07/26/2016 2254   BILIRUBINUR NEGATIVE 07/26/2016 2254   BILIRUBINUR MODERATE 03/03/2014 1028   KETONESUR NEGATIVE 07/26/2016 2254   PROTEINUR NEGATIVE 07/26/2016 2254   UROBILINOGEN 1.0 03/11/2014 2130   NITRITE  NEGATIVE 07/26/2016 2254   LEUKOCYTESUR NEGATIVE 07/26/2016 2254    Radiological Exams on Admission: Ct Abdomen Pelvis Wo Contrast  Result Date: 08/09/2017 CLINICAL DATA:  Per ED notes: Pt presents today for nausea x 2 days. Took zofran at home with no relief. Pt denies diarrhea. Pt take oral chemo for thyroid CA with mets. Pt denies being around anyone with similar symptoms. EXAM: CT ABDOMEN AND PELVIS WITHOUT CONTRAST TECHNIQUE: Multidetector CT imaging of the abdomen and pelvis was performed following the standard protocol without IV contrast. COMPARISON:  PET-CT 05/20/2017 and previous FINDINGS: Lower chest: Lower lobe pulmonary nodules up to 5 mm, stable since previous. No pleural or pericardial effusion. Hepatobiliary: Gallbladder is distended with small partially calcified stones in its dependent aspect. Unremarkable liver. Pancreas: Unremarkable. No pancreatic ductal dilatation or surrounding inflammatory changes. Spleen: Normal in size without focal abnormality. Adrenals/Urinary Tract: Adrenal glands are unremarkable. Kidneys are normal, without renal calculi, focal lesion, or hydronephrosis. Bladder is unremarkable. Stomach/Bowel: Stomach is nondistended. Small bowel is nondilated. Colon is nondilated with a few scattered diverticula. Vascular/Lymphatic: No significant vascular findings are present. No enlarged abdominal or pelvic lymph nodes. Reproductive: Uterus and bilateral adnexa are unremarkable. Other: No ascites.  No free air. Musculoskeletal: Spondylitic changes throughout the lumbar spine. Bilateral hip degenerative spurring. No fracture or worrisome bone lesion. IMPRESSION: 1. No acute findings. 2. Stable small pulmonary nodules at the visualized lung bases. Electronically Signed   By: Lucrezia Europe M.D.   On: 08/09/2017 13:54    EKG: no acute ischemic changes; normal axis, sinus rhythm; non specific T wave inversion.   Assessment/Plan 1-Intractable nausea and vomiting: with associated  epigastric pain. -most likely associated chemotherapy, Vs viral gastroenteritis, Vs gastritis/PUD -other consideration is gallbladder problems given elevated bilirubin and visible gallstones on CT. 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  3-Type 2 diabetes mellitus without complication (HCC) -controlled with diet -will use SSI for now  4-GERD (gastroesophageal reflux disease) -continue PPI and add famotidine   5-Hypertension -BP normal to soft -holding antihypertensive agents currently -follow VS -provide IVF's  6-Postoperative hypothyroidism -check TSH -continue synthroid   7-Depression -will continue Zoloft  -no SI or hallucinations   8-Thrombocytopenia (HCC) -no signs of acute bleeding  -prior hx of HIT -avoid  heparin products -continue xarelto for now -follow platelets trend   9-AKI (acute kidney injury) ([place]) -most likely in the setting of pre-renal azotemia and continue use of neprhotoxic agents -will provide IVFs, stop nephrotoxic agents -follow renal function trend   10-hx of PE -no SOB currently -continue xarelto.  Time: 70 minutes   DVT prophylaxis: Chronically on Xarelto Code Status: Full code Family Communication: Mother and sister at bedside. Disposition Plan: Anticipate discharge back home once medically stable, tolerating diet, control nausea and vomiting. Consults called: None Admission status: Length of stay more than 2 midnights, MedSurg, inpatient.   Barton Dubois MD [facility] Pager [phone]  If 7PM-7AM, please contact night-coverage [email or web address] Password [email or web address]  08/09/2017, 4:50 PM

## 2017-08-09 NOTE — ED Notes (Signed)
Urine sample clicked off by mistake. Not able to obtain sample yet.

## 2017-08-10 ENCOUNTER — Inpatient Hospital Stay (HOSPITAL_COMMUNITY): Payer: Medicare Other

## 2017-08-10 ENCOUNTER — Ambulatory Visit: Payer: Medicare Other | Admitting: Physical Therapy

## 2017-08-10 DIAGNOSIS — D701 Agranulocytosis secondary to cancer chemotherapy: Secondary | ICD-10-CM

## 2017-08-10 DIAGNOSIS — C73 Malignant neoplasm of thyroid gland: Secondary | ICD-10-CM

## 2017-08-10 DIAGNOSIS — N289 Disorder of kidney and ureter, unspecified: Secondary | ICD-10-CM

## 2017-08-10 DIAGNOSIS — C7951 Secondary malignant neoplasm of bone: Secondary | ICD-10-CM

## 2017-08-10 DIAGNOSIS — R1013 Epigastric pain: Secondary | ICD-10-CM

## 2017-08-10 DIAGNOSIS — C778 Secondary and unspecified malignant neoplasm of lymph nodes of multiple regions: Secondary | ICD-10-CM

## 2017-08-10 DIAGNOSIS — C7800 Secondary malignant neoplasm of unspecified lung: Secondary | ICD-10-CM

## 2017-08-10 LAB — COMPREHENSIVE METABOLIC PANEL
ALBUMIN: 2.7 g/dL — AB (ref 3.5–5.0)
ALT: 10 U/L — ABNORMAL LOW (ref 14–54)
AST: 18 U/L (ref 15–41)
Alkaline Phosphatase: 120 U/L (ref 38–126)
Anion gap: 8 (ref 5–15)
BILIRUBIN TOTAL: 2 mg/dL — AB (ref 0.3–1.2)
BUN: 18 mg/dL (ref 6–20)
CHLORIDE: 107 mmol/L (ref 101–111)
CO2: 26 mmol/L (ref 22–32)
Calcium: 8 mg/dL — ABNORMAL LOW (ref 8.9–10.3)
Creatinine, Ser: 1.17 mg/dL — ABNORMAL HIGH (ref 0.44–1.00)
GFR calc Af Amer: >60 mL/min (ref >60)
GFR calc non Af Amer: 53 mL/min — ABNORMAL LOW (ref >60)
GLUCOSE: 105 mg/dL — AB (ref 65–99)
POTASSIUM: 4.1 mmol/L (ref 3.5–5.1)
Sodium: 141 mmol/L (ref 135–145)
TOTAL PROTEIN: 5.4 g/dL — AB (ref 6.5–8.1)

## 2017-08-10 LAB — SAVE SMEAR

## 2017-08-10 LAB — CBC
HEMATOCRIT: 35.8 % — AB (ref 36.0–46.0)
Hemoglobin: 11.6 g/dL — ABNORMAL LOW (ref 12.0–15.0)
MCH: 30.1 pg (ref 26.0–34.0)
MCHC: 32.4 g/dL (ref 30.0–36.0)
MCV: 93 fL (ref 78.0–100.0)
Platelets: 62 10*3/uL — ABNORMAL LOW (ref 150–400)
RBC: 3.85 MIL/uL — ABNORMAL LOW (ref 3.87–5.11)
RDW: 15.3 % (ref 11.5–15.5)
WBC: 6.6 10*3/uL (ref 4.0–10.5)

## 2017-08-10 LAB — GLUCOSE, CAPILLARY
GLUCOSE-CAPILLARY: 86 mg/dL (ref 65–99)
Glucose-Capillary: 94 mg/dL (ref 65–99)
Glucose-Capillary: 87 mg/dL (ref 65–99)
Glucose-Capillary: 97 mg/dL (ref 65–99)

## 2017-08-10 LAB — HEMOGLOBIN A1C
Hgb A1c MFr Bld: 5.5 % (ref 4.8–5.6)
Mean Plasma Glucose: 111.15 mg/dL

## 2017-08-10 MED ORDER — NIFEDIPINE ER OSMOTIC RELEASE 30 MG PO TB24
30.0000 mg | ORAL_TABLET | Freq: Every day | ORAL | Status: DC
  Filled 2017-08-10 (×3): qty 1

## 2017-08-10 MED ORDER — MUSCLE RUB 10-15 % EX CREA
TOPICAL_CREAM | CUTANEOUS | Status: DC | PRN
  Administered 2017-08-10: via TOPICAL
  Filled 2017-08-10 (×2): qty 85

## 2017-08-10 NOTE — Progress Notes (Signed)
[facility] PROGRESS NOTE  Sarah [name] OBS:[id number] DOB: 22-Nov-1965 DOA: 08/09/2017 PCP: [name], MD  Interim summary and history of present illness, 52 y/o with PMH significant for metastatic thyroid cancer (actively receiving chemotherapy), HTN, HIT, PE and depression; who presented  to ED with 2-3 days of abd pain, nausea and vomiting. Patient's abd pain: is localized in epigastric region, 8/10 in intensity, no radiated, stabbing in nature, present for the last 3 days and worsening. Pain is associated with nausea and vomiting; no blood content and currently unable to keep anything down. Patient denies fever, SOB, cough, hematuria, hematochezia, melena, HA's, focal weakness, CP or any other complaints.  Assessment/Plan: 1-intractable nausea and vomiting: With associated epigastric/right upper quadrant pain. -With improvement after supportive care -Continue IV fluids -Continue PPI and famotidine. -Ultrasound demonstrating cholelithiasis but no acute cholecystitis. -General surgery curbside and recommended performing HIDA scan. -Continue as needed antiemetics and analgesics.  2-hurthle cell carcinoma of the thyroid -Continue holding chemotherapy -Outpatient follow-up with oncology service.  3-type 2 diabetes mellitus without complication. -currently controlled with diet  -Continue sliding scale insulin. -A1C 5.5  4-HTN -continue holding nephrotoxic agents  -will resume procardia  5-GERD -continue PPI and famotidine   6-postoperative hypothyroidism  -continue synthroid   7-depression  -continue zoloft   8-thrombocytopenia -will check peripheral smear -oncology consulted   9-AKI -Continue holding nephrotoxic agents -Continue IV fluids -Renal function trending down -Patient reports good urine output.  10-prior hx of PE -continue xarelto for now  -no signs of acute bleeding   Code Status: Full code Family Communication: No family at  bedside. Disposition Plan: Continue fluid resuscitation, slowly advancing diet, as needed analgesics and antiemetics.  HIDA scan has been ordered for 5/21   Consultants:  General surgery curbside: Recommended HIDA scan.  Procedures:  See below for x-ray reports.  Antibiotics:  None  HPI/Subjective: No further nausea vomiting.  Reports improvement in her epigastric discomfort, no chest pain or shortness of breath.  Objective: Vitals:   08/09/17 2147 08/10/17 0523  BP: (!) 145/88 (!) 150/94  Pulse: (!) 115 (!) 109  Resp: 20 18  Temp: 99.8 F (37.7 C) 98.6 F (37 C)  SpO2: 99% 98%    Intake/Output Summary (Last 24 hours) at 08/10/2017 1531 Last data filed at 08/10/2017 0900 Gross per 24 hour  Intake 1380.42 ml  Output -  Net 1380.42 ml   Filed Weights   08/09/17 0955 08/09/17 1604  Weight: 94.3 kg (208 lb) 96 kg (211 lb 10.3 oz)    Exam:   General: Afebrile, no chest pain, no shortness of breath and currently denying nausea/vomiting.  Reports epigastric discomfort has improved.  Increase urine output.  Cardiovascular: 1 and S2, no rubs, no gallops, no murmurs  Respiratory: Good air movement bilaterally, no wheezing, no crackles  Abdomen: Soft, nondistended, positive bowel sounds, mild discomfort with deep palpation (epigastric area).  Musculoskeletal: No edema, no cyanosis, no clubbing.  Data Reviewed: Basic Metabolic Panel: Recent Labs  Lab 08/09/17 1052 08/09/17 1345 08/10/17 0504  NA 139  --  141  K 3.8  --  4.1  CL 105  --  107  CO2 26  --  26  GLUCOSE 120*  --  105*  BUN 18  --  18  CREATININE 1.21*  --  1.17*  CALCIUM 7.8*  --  8.0*  MG  --  1.7  --   PHOS  --  3.3  --    Liver Function Tests: Recent  Labs  Lab 08/09/17 1052 08/10/17 0504  AST 19 18  ALT 10* 10*  ALKPHOS 125 120  BILITOT 2.1* 2.0*  PROT 5.3* 5.4*  ALBUMIN 2.7* 2.7*   Recent Labs  Lab 08/09/17 1052  LIPASE 17   CBC: Recent Labs  Lab 08/09/17 1052  08/10/17 0504  WBC 12.8* 6.6  NEUTROABS 11.4*  --   HGB 11.3* 11.6*  HCT 35.2* 35.8*  MCV 92.6 93.0  PLT 82* 62*   Cardiac Enzymes: Recent Labs  Lab 08/09/17 1052  TROPONINI <0.03    CBG: Recent Labs  Lab 08/09/17 1732 08/09/17 2132 08/10/17 0815 08/10/17 1148  GLUCAP 107* 182* 94 87    Studies: Ct Abdomen Pelvis Wo Contrast  Result Date: 08/09/2017 CLINICAL DATA:  Per ED notes: Pt presents today for nausea x 2 days. Took zofran at home with no relief. Pt denies diarrhea. Pt take oral chemo for thyroid CA with mets. Pt denies being around anyone with similar symptoms. EXAM: CT ABDOMEN AND PELVIS WITHOUT CONTRAST TECHNIQUE: Multidetector CT imaging of the abdomen and pelvis was performed following the standard protocol without IV contrast. COMPARISON:  PET-CT 05/20/2017 and previous FINDINGS: Lower chest: Lower lobe pulmonary nodules up to 5 mm, stable since previous. No pleural or pericardial effusion. Hepatobiliary: Gallbladder is distended with small partially calcified stones in its dependent aspect. Unremarkable liver. Pancreas: Unremarkable. No pancreatic ductal dilatation or surrounding inflammatory changes. Spleen: Normal in size without focal abnormality. Adrenals/Urinary Tract: Adrenal glands are unremarkable. Kidneys are normal, without renal calculi, focal lesion, or hydronephrosis. Bladder is unremarkable. Stomach/Bowel: Stomach is nondistended. Small bowel is nondilated. Colon is nondilated with a few scattered diverticula. Vascular/Lymphatic: No significant vascular findings are present. No enlarged abdominal or pelvic lymph nodes. Reproductive: Uterus and bilateral adnexa are unremarkable. Other: No ascites.  No free air. Musculoskeletal: Spondylitic changes throughout the lumbar spine. Bilateral hip degenerative spurring. No fracture or worrisome bone lesion. IMPRESSION: 1. No acute findings. 2. Stable small pulmonary nodules at the visualized lung bases. Electronically  Signed   By: Lucrezia Europe M.D.   On: 08/09/2017 13:54   US Abdomen Complete  Result Date: 08/10/2017 CLINICAL DATA:  Elevated bilirubin level. EXAM: ABDOMEN ULTRASOUND COMPLETE COMPARISON:  CT scan of Aug 09, 2017. FINDINGS: Gallbladder: Multiple gallstones are noted with the largest measuring 1.1 cm. No gallbladder wall thickening or pericholecystic fluid is noted. No sonographic Murphy's sign is noted. Common bile duct: Diameter: 2.8 mm which is within normal limits. Liver: No focal lesion identified. Within normal limits in parenchymal echogenicity. Portal vein is patent on color Doppler imaging with normal direction of blood flow towards the liver. IVC: No abnormality visualized. Pancreas: Not well visualized due to overlying bowel gas. Spleen: Size and appearance within normal limits. Right Kidney: Length: 11 cm. Echogenicity within normal limits. No mass or hydronephrosis visualized. Left Kidney: Length: 10.2 cm. Echogenicity within normal limits. No mass or hydronephrosis visualized. Abdominal aorta: No aneurysm visualized. Other findings: None. IMPRESSION: Cholelithiasis without inflammation. Pancreas not well visualized due to overlying bowel gas. No other abnormality seen in the abdomen. Electronically Signed   By: Marijo Conception, M.D.   On: 08/10/2017 14:32    Scheduled Meds: . baclofen  5 mg Oral TID  . fluticasone  2 spray Each Nare Daily  . insulin aspart  0-9 Units Subcutaneous TID WC  . levothyroxine  137 mcg Oral QAC breakfast  . pantoprazole  40 mg Oral Daily  . rivaroxaban  20 mg Oral  Daily  . senna  2 tablet Oral Daily  . sertraline  25 mg Oral Daily   Continuous Infusions: . sodium chloride 125 mL/hr at 08/10/17 1147  . famotidine (PEPCID) IV Stopped (08/09/17 2350)    Time spent: 30 minutes    [facility] Pager [phone]. If 7PM-7AM, please contact night-coverage at [email or web address], password [facility] 08/10/2017, 3:31 PM  LOS: 1 day

## 2017-08-10 NOTE — Consult Note (Signed)
[facility] Consultation Oncology  Name: Sarah [name]      MRN: [id number]    Location: N829/F621-30  Date: 08/10/2017 Time:8:18 PM   REFERRING PHYSICIAN: Dr. Dyann Kief  REASON FOR CONSULT: Thrombocytopenia   DIAGNOSIS: Nausea, vomiting, renal insufficiency and myelosuppression from lenvatinib  HISTORY OF PRESENT ILLNESS: She is a 52 year old very pleasant white female who is seen in consultation today for further work-up of thrombocytopenia.  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  About a year ago, dose was increased to 24 mg daily.  Her recent blood counts were grossly within normal limits.  She developed nausea and vomiting with epigastric pain for the last couple of days.  When she presented to the emergency room she was found to have thrombocytopenia of 82.  Today her platelet count dropped to 62.  She denies any fevers at home.  No sick contacts noted.  She received IV fluids and antinausea medicine.  Her nausea and vomiting is better controlled at this time.  She did not have any similar side effects from lenvatinib thus far.  She follows with [name] in [place].  She had a CT scan of the abdomen and pelvis done in the ER which showed subcentimeter pulmonary nodules in the bases.  There were gallstones in the gallbladder.  An ultrasound of the abdomen also confirmed gallstones but no inflammation.  She reports her Synthroid dose was also reduced recently.  PAST MEDICAL HISTORY:   Past Medical History:  Diagnosis Date  . Cancer [facility])    FNA of thyroid positive for onconytic/hurthle cell carcinoma  . Chronic back pain   . DDD (degenerative disc disease), cervical   . DJD (degenerative joint disease)   . History of rectal fissure   . HIT (heparin-induced thrombocytopenia) ([place])   . Hypertension   . Obesity   . Paraplegia at T4 level ([place])   . Pulmonary embolus, right  ([place]) 2015  . Rotator cuff tendonitis right    ALLERGIES: Allergies  Allergen Reactions  . Bee Venom Anaphylaxis  . Keflex [Cephalexin] Nausea And Vomiting  . Tramadol Nausea And Vomiting  . Gabapentin Nausea And Vomiting  . Heparin Other (See Comments)    Weak positive platelets induced antibodies, SRA negative--2015  . Hydrochlorothiazide Nausea And Vomiting  . Hydrocodone Nausea And Vomiting  . Oxycodone-Acetaminophen Nausea And Vomiting      MEDICATIONS: I have reviewed the patient's current medications.     PAST SURGICAL HISTORY Past Surgical History:  Procedure Laterality Date  . LAMINECTOMY N/A 12/14/2013   Procedure: THORACIC LAMINECTOMY FOR TUMOR THORACIC THREE;  Surgeon: Ashok Pall, MD;  Location: [place] NEURO ORS;  Service: Neurosurgery;  Laterality: N/A;  THORACIC LAMINECTOMY FOR TUMOR THORACIC THREE  . LAMINECTOMY N/A 07/05/2015   Procedure: THORACIC LAMINECTOMY FOR TUMOR;  Surgeon: Ashok Pall, MD;  Location: [place] [facility]UR[facility] ORS;  Service: Neurosurgery;  Laterality: N/A;  . POSTERIOR LUMBAR FUSION 4 LEVEL N/A 12/30/2013   Procedure: Thoracic one-Thoracic five posterior thoracic fusion with pedicle screws;  Surgeon: Ashok Pall, MD;  Location: [place] NEURO ORS;  Service: Neurosurgery;  Laterality: N/A;  Thoracic one-Thoracic five posterior thoracic fusion with pedicle screws  . THYROIDECTOMY N/A 01/09/2014   Procedure: TOTAL THYROIDECTOMY;  Surgeon: Izora Gala, MD;  Location: [place];  Service: ENT;  Laterality: N/A;  . TONSILLECTOMY      FAMILY HISTORY: Family History  Problem Relation Age of Onset  . Hypertension Mother   . Diabetes Mother   . Hypertension Father   . Stroke Father   . Diabetes Father   . Diabetes Sister   . Diabetes Sister     SOCIAL HISTORY:  reports that she has never smoked. She has never used smokeless tobacco. She reports that she does not drink alcohol or use drugs.  PERFORMANCE STATUS: The patient's performance status is 1 - Symptomatic but  completely ambulatory  PHYSICAL EXAM: Most Recent Vital Signs: Blood pressure (!) 158/90, pulse 99, temperature 98.8 F (37.1 C), temperature source Oral, resp. rate 16, height _0  (1.803 m), weight 211 lb 10.3 oz (96 kg), last menstrual period 10/22/2016, SpO2 100 %. General appearance: alert, cooperative and no distress  HEENT: No mucositis. Chest: Bilaterally clear. Abdomen: Mild tenderness in the epigastric region.  No palpable masses.  LABORATORY DATA:  Results for orders placed or performed during the hospital encounter of 08/09/17 (from the past 48 hour(s))  Comprehensive metabolic panel     Status: Abnormal   Collection Time: 08/09/17 10:52 AM  Result Value Ref Range   Sodium 139 135 - 145 mmol/L   Potassium 3.8 3.5 - 5.1 mmol/L   Chloride 105 101 - 111 mmol/L   CO2 26 22 - 32 mmol/L   Glucose, Bld 120 (H) 65 - 99 mg/dL   BUN 18 6 - 20 mg/dL   Creatinine, Ser 1.21 (H) 0.44 - 1.00 mg/dL   Calcium 7.8 (L) 8.9 - 10.3 mg/dL   Total Protein 5.3 (L) 6.5 - 8.1 g/dL   Albumin 2.7 (L) 3.5 - 5.0 g/dL   AST 19 15 - 41 U/L   ALT 10 (L) 14 - 54 U/L   Alkaline Phosphatase 125 38 - 126 U/L   Total Bilirubin 2.1 (H) 0.3 - 1.2 mg/dL   GFR calc non Af Amer 51 (L) >60 mL/min   GFR calc Af Amer 59 (L) >60 mL/min    Comment: (NOTE) The eGFR has been calculated using the CKD EPI equation. This calculation has not been validated in all clinical situations. eGFR's persistently <60 mL/min signify possible Chronic Kidney Disease.    Anion gap 8 5 - 15    Comment: Performed at [facility], [address] [place], [place] [address]  CBC with Differential     Status: Abnormal   Collection Time: 08/09/17 10:52 AM  Result Value Ref Range   WBC 12.8 (H) 4.0 - 10.5 K/uL   RBC 3.80 (L) 3.87 - 5.11 MIL/uL   Hemoglobin 11.3 (L) 12.0 - 15.0 g/dL   HCT 35.2 (L) 36.0 - 46.0 %   MCV 92.6 78.0 - 100.0 fL   MCH 29.7 26.0 - 34.0 pg   MCHC 32.1 30.0 - 36.0 g/dL   RDW 15.0 11.5 - 15.5 %   Platelets  82 (L) 150 - 400 K/uL    Comment: PLATELET COUNT CONFIRMED BY SMEAR SPECIMEN CHECKED FOR CLOTS GIANT PLATELETS SEEN    Neutrophils Relative % 89 %   Lymphocytes Relative 7 %   Monocytes Relative 4 %   Eosinophils Relative 0 %   Basophils Relative 0 %   Neutro Abs 11.4 (H) 1.7 - 7.7 K/uL   Lymphs Abs 0.9 0.7 - 4.0 K/uL   Monocytes Absolute 0.5 0.1 - 1.0 K/uL   Eosinophils Absolute 0.0 0.0 - 0.7 K/uL   Basophils Absolute 0.0 0.0 - 0.1 K/uL   WBC Morphology MILD LEFT SHIFT (1-5%  METAS, OCC MYELO, OCC BANDS)     Comment: TOXIC GRANULATION Performed at [facility], [address] [place], [place] [address]   Lipase, blood     Status: None   Collection Time: 08/09/17 10:52 AM  Result Value Ref Range   Lipase 17 11 - 51 U/L    Comment: Performed at [facility], [address] [place], [place] [address]  Troponin I     Status: None   Collection Time: 08/09/17 10:52 AM  Result Value Ref Range   Troponin I <0.03 <0.03 ng/mL    Comment: Performed at [facility], [address] [place], [place] [address]  TSH     Status: Abnormal   Collection Time: 08/09/17 10:52 AM  Result Value Ref Range   TSH 0.010 (L) 0.350 - 4.500 uIU/mL    Comment: Performed by a 3rd Generation assay with a functional sensitivity of <=0.01 uIU/mL. Performed at [facility], [address], [place], [place] [address]   Hemoglobin A1c     Status: None   Collection Time: 08/09/17 10:52 AM  Result Value Ref Range   Hgb A1c MFr Bld 5.5 4.8 - 5.6 %    Comment: (NOTE) Pre diabetes:          5.7%-6.4% Diabetes:              >6.4% Glycemic control for   <7.0% adults with diabetes    Mean Plasma Glucose 111.15 mg/dL    Comment: Performed at [facility] [address], [place], [place] [address]  Lactic acid, plasma     Status: None   Collection Time: 08/09/17 12:05 PM  Result Value Ref Range   Lactic Acid, Venous 1.3 0.5 - 1.9 mmol/L    Comment: Performed at [facility], [address] [place],  [place] [address]  Lactic acid, plasma     Status: None   Collection Time: 08/09/17  1:25 PM  Result Value Ref Range   Lactic Acid, Venous 1.1 0.5 - 1.9 mmol/L    Comment: Performed at [facility], [address] [place], [place] [address]  Magnesium     Status: None   Collection Time: 08/09/17  1:45 PM  Result Value Ref Range   Magnesium 1.7 1.7 - 2.4 mg/dL    Comment: Performed at [facility], [address] [place], [place] [address]  Phosphorus     Status: None   Collection Time: 08/09/17  1:45 PM  Result Value Ref Range   Phosphorus 3.3 2.5 - 4.6 mg/dL    Comment: Performed at [facility], [address] [place], [place] [address]  Glucose, capillary     Status: Abnormal   Collection Time: 08/09/17  5:32 PM  Result Value Ref Range   Glucose-Capillary 107 (H) 65 - 99 mg/dL   Comment 1 Notify RN    Comment 2 Document in Chart   Glucose, capillary     Status: Abnormal   Collection Time: 08/09/17  9:32 PM  Result Value Ref Range   Glucose-Capillary 182 (H) 65 - 99 mg/dL   Comment 1 Notify RN    Comment 2 Document in Chart   Comprehensive metabolic panel     Status: Abnormal   Collection Time: 08/10/17  5:04 AM  Result Value Ref Range   Sodium 141 135 - 145 mmol/L   Potassium 4.1 3.5 - 5.1 mmol/L   Chloride 107 101 - 111 mmol/L   CO2 26 22 - 32 mmol/L  Glucose, Bld 105 (H) 65 - 99 mg/dL   BUN 18 6 - 20 mg/dL   Creatinine, Ser 1.17 (H) 0.44 - 1.00 mg/dL   Calcium 8.0 (L) 8.9 - 10.3 mg/dL   Total Protein 5.4 (L) 6.5 - 8.1 g/dL   Albumin 2.7 (L) 3.5 - 5.0 g/dL   AST 18 15 - 41 U/L   ALT 10 (L) 14 - 54 U/L   Alkaline Phosphatase 120 38 - 126 U/L   Total Bilirubin 2.0 (H) 0.3 - 1.2 mg/dL   GFR calc non Af Amer 53 (L) >60 mL/min   GFR calc Af Amer >60 >60 mL/min    Comment: (NOTE) The eGFR has been calculated using the CKD EPI equation. This calculation has not been validated in all clinical situations. eGFR's persistently <60 mL/min signify possible Chronic Kidney Disease.     Anion gap 8 5 - 15    Comment: Performed at [facility], [address] [place], [place] [address]  CBC     Status: Abnormal   Collection Time: 08/10/17  5:04 AM  Result Value Ref Range   WBC 6.6 4.0 - 10.5 K/uL   RBC 3.85 (L) 3.87 - 5.11 MIL/uL   Hemoglobin 11.6 (L) 12.0 - 15.0 g/dL   HCT 35.8 (L) 36.0 - 46.0 %   MCV 93.0 78.0 - 100.0 fL   MCH 30.1 26.0 - 34.0 pg   MCHC 32.4 30.0 - 36.0 g/dL   RDW 15.3 11.5 - 15.5 %   Platelets 62 (L) 150 - 400 K/uL    Comment: SPECIMEN CHECKED FOR CLOTS CONSISTENT WITH PREVIOUS RESULT Performed at [facility], [address] [place], [place] [address]   Save smear     Status: None   Collection Time: 08/10/17  5:04 AM  Result Value Ref Range   Smear Review SMEAR STAINED AND AVAILABLE FOR REVIEW     Comment: Performed at [facility], [address], [place], [place] [address]  Glucose, capillary     Status: None   Collection Time: 08/10/17  8:15 AM  Result Value Ref Range   Glucose-Capillary 94 65 - 99 mg/dL   Comment 1 Notify RN   Glucose, capillary     Status: None   Collection Time: 08/10/17 11:48 AM  Result Value Ref Range   Glucose-Capillary 87 65 - 99 mg/dL   Comment 1 Notify RN   Glucose, capillary     Status: None   Collection Time: 08/10/17  4:24 PM  Result Value Ref Range   Glucose-Capillary 97 65 - 99 mg/dL   Comment 1 Notify RN       RADIOGRAPHY: Ct Abdomen Pelvis Wo Contrast  Result Date: 08/09/2017 CLINICAL DATA:  Per ED notes: Pt presents today for nausea x 2 days. Took zofran at home with no relief. Pt denies diarrhea. Pt take oral chemo for thyroid CA with mets. Pt denies being around anyone with similar symptoms. EXAM: CT ABDOMEN AND PELVIS WITHOUT CONTRAST TECHNIQUE: Multidetector CT imaging of the abdomen and pelvis was performed following the standard protocol without IV contrast. COMPARISON:  PET-CT 05/20/2017 and previous FINDINGS: Lower chest: Lower lobe pulmonary nodules up to 5 mm, stable since previous. No  pleural or pericardial effusion. Hepatobiliary: Gallbladder is distended with small partially calcified stones in its dependent aspect. Unremarkable liver. Pancreas: Unremarkable. No pancreatic ductal dilatation or surrounding inflammatory changes. Spleen: Normal in size without focal abnormality. Adrenals/Urinary Tract: Adrenal glands are unremarkable. Kidneys are normal, without renal calculi, focal  lesion, or hydronephrosis. Bladder is unremarkable. Stomach/Bowel: Stomach is nondistended. Small bowel is nondilated. Colon is nondilated with a few scattered diverticula. Vascular/Lymphatic: No significant vascular findings are present. No enlarged abdominal or pelvic lymph nodes. Reproductive: Uterus and bilateral adnexa are unremarkable. Other: No ascites.  No free air. Musculoskeletal: Spondylitic changes throughout the lumbar spine. Bilateral hip degenerative spurring. No fracture or worrisome bone lesion. IMPRESSION: 1. No acute findings. 2. Stable small pulmonary nodules at the visualized lung bases. Electronically Signed   By: Lucrezia Europe M.D.   On: 08/09/2017 13:54   US Abdomen Complete  Result Date: 08/10/2017 CLINICAL DATA:  Elevated bilirubin level. EXAM: ABDOMEN ULTRASOUND COMPLETE COMPARISON:  CT scan of Aug 09, 2017. FINDINGS: Gallbladder: Multiple gallstones are noted with the largest measuring 1.1 cm. No gallbladder wall thickening or pericholecystic fluid is noted. No sonographic Murphy's sign is noted. Common bile duct: Diameter: 2.8 mm which is within normal limits. Liver: No focal lesion identified. Within normal limits in parenchymal echogenicity. Portal vein is patent on color Doppler imaging with normal direction of blood flow towards the liver. IVC: No abnormality visualized. Pancreas: Not well visualized due to overlying bowel gas. Spleen: Size and appearance within normal limits. Right Kidney: Length: 11 cm. Echogenicity within normal limits. No mass or hydronephrosis visualized. Left  Kidney: Length: 10.2 cm. Echogenicity within normal limits. No mass or hydronephrosis visualized. Abdominal aorta: No aneurysm visualized. Other findings: None. IMPRESSION: Cholelithiasis without inflammation. Pancreas not well visualized due to overlying bowel gas. No other abnormality seen in the abdomen. Electronically Signed   By: Marijo Conception, M.D.   On: 08/10/2017 14:32         ASSESSMENT: 1. Thrombocytopenia from myelosuppression from lenvatinib 2.  Renal insufficiency likely from lenvatinib, nausea and vomiting  PLAN: Thrombocytopenia: This is highly likely from myelosuppression from lenvatinib.  Renal insufficiency likely pushed her into developing myelosuppression.  We will check an LDH level and review the smear.  She has last taken her lenvatinib on Sunday.  We will continue to hold lenvatinib until thrombocytopenia, and renal insufficiency improve to grade 1.  Her nausea and vomiting is already improved.  I have discussed the results of the CT scans and ultrasound of the patient.  She was advised to follow-up with [name] after discharge.  All questions were answered. The patient knows to call the clinic with any problems, questions or concerns.    Derek Jack

## 2017-08-11 ENCOUNTER — Inpatient Hospital Stay (HOSPITAL_COMMUNITY): Payer: Medicare Other

## 2017-08-11 ENCOUNTER — Ambulatory Visit: Payer: Medicare Other | Admitting: Physical Therapy

## 2017-08-11 LAB — COMPREHENSIVE METABOLIC PANEL
ALK PHOS: 97 U/L (ref 38–126)
ALT: 10 U/L — ABNORMAL LOW (ref 14–54)
ANION GAP: 6 (ref 5–15)
AST: 13 U/L — AB (ref 15–41)
Albumin: 2.3 g/dL — ABNORMAL LOW (ref 3.5–5.0)
BILIRUBIN TOTAL: 1.6 mg/dL — AB (ref 0.3–1.2)
BUN: 18 mg/dL (ref 6–20)
CALCIUM: 7.7 mg/dL — AB (ref 8.9–10.3)
CO2: 24 mmol/L (ref 22–32)
Chloride: 107 mmol/L (ref 101–111)
Creatinine, Ser: 0.89 mg/dL (ref 0.44–1.00)
GFR calc Af Amer: >60 mL/min (ref >60)
GLUCOSE: 111 mg/dL — AB (ref 65–99)
Potassium: 3.8 mmol/L (ref 3.5–5.1)
Sodium: 137 mmol/L (ref 135–145)
TOTAL PROTEIN: 5.1 g/dL — AB (ref 6.5–8.1)

## 2017-08-11 LAB — CBC
HCT: 33.9 % — ABNORMAL LOW (ref 36.0–46.0)
Hemoglobin: 10.8 g/dL — ABNORMAL LOW (ref 12.0–15.0)
MCH: 29.4 pg (ref 26.0–34.0)
MCHC: 31.9 g/dL (ref 30.0–36.0)
MCV: 92.4 fL (ref 78.0–100.0)
Platelets: 45 10*3/uL — ABNORMAL LOW (ref 150–400)
RBC: 3.67 MIL/uL — ABNORMAL LOW (ref 3.87–5.11)
RDW: 15 % (ref 11.5–15.5)
WBC: 5.2 10*3/uL (ref 4.0–10.5)

## 2017-08-11 LAB — GLUCOSE, CAPILLARY
GLUCOSE-CAPILLARY: 97 mg/dL (ref 65–99)
Glucose-Capillary: 86 mg/dL (ref 65–99)
Glucose-Capillary: 134 mg/dL — ABNORMAL HIGH (ref 65–99)
Glucose-Capillary: 100 mg/dL — ABNORMAL HIGH (ref 65–99)

## 2017-08-11 LAB — LACTATE DEHYDROGENASE: LDH: 103 U/L (ref 98–192)

## 2017-08-11 LAB — HIV ANTIBODY (ROUTINE TESTING W REFLEX): HIV Screen 4th Generation wRfx: NONREACTIVE

## 2017-08-11 MED ORDER — SINCALIDE 5 MCG IJ SOLR
INTRAMUSCULAR | Status: AC
  Administered 2017-08-11: 1.91 ug
  Filled 2017-08-11: qty 5

## 2017-08-11 MED ORDER — HYDRALAZINE HCL 20 MG/ML IJ SOLN: 10.0000 mg | Freq: Three times a day (TID) | INTRAMUSCULAR | Status: DC | PRN

## 2017-08-11 MED ORDER — STERILE WATER FOR INJECTION IJ SOLN
INTRAMUSCULAR | Status: AC
  Administered 2017-08-11: 1.91 mL
  Filled 2017-08-11: qty 10

## 2017-08-11 MED ORDER — TECHNETIUM TC 99M MEBROFENIN IV KIT
5.0000 | PACK | Freq: Once | INTRAVENOUS | Status: AC | PRN
  Administered 2017-08-11: 5.5 via INTRAVENOUS

## 2017-08-11 NOTE — Progress Notes (Signed)
[facility]  Will see in consult tomorrow. Cholelithiasis and possible biliary dyskinesia on HIDA. No signs of infection or indication for emergent surgery. On diet per orders.  Metastatic Hurtle Cell Cancer. On Xarelto for PE.  Will see and discuss with patient tomorrow.   Curlene Labrum, MD [facility] [address] [place], [place] [address] [phone] (office)

## 2017-08-11 NOTE — Progress Notes (Signed)
[facility] PROGRESS NOTE  Sarah [name] JJK:[id number] DOB: 1965-12-01 DOA: 08/09/2017 PCP: [name], MD  Interim summary and history of present illness, 52 y/o with PMH significant for metastatic thyroid cancer (actively receiving chemotherapy), HTN, HIT, PE and depression; who presented  to ED with 2-3 days of abd pain, nausea and vomiting. Patient's abd pain: is localized in epigastric region, 8/10 in intensity, no radiated, stabbing in nature, present for the last 3 days and worsening. Pain is associated with nausea and vomiting; no blood content and currently unable to keep anything down. Patient denies fever, SOB, cough, hematuria, hematochezia, melena, HA's, focal weakness, CP or any other complaints.  Assessment/Plan: 1-intractable nausea and vomiting: With associated epigastric/right upper quadrant pain. -Overall feeling better with supportive care. -Continue IV fluids; but will adjust rate -Continue PPI and famotidine. -Ultrasound demonstrating cholelithiasis but no acute cholecystitis. -Patient without elevated WBCs and not having much of abdominal pain currently.  -She had low-grade temperature overnight but no really source of infection appreciated. -HIDA scan demonstrating presumed biliary dyskinesia; but with patent biliary tree. -General surgery consulted and will see patient in a.m. -Continue as needed antiemetics and analgesics.  2-hurthle cell carcinoma of the thyroid -Continue holding chemotherapy. -Outpatient follow-up with oncology service. -According to records, patient has good response to current medication therapy  3-type 2 diabetes mellitus without complication. -currently controlled with diet  -Continue sliding scale insulin. -A1C 5.5  4-HTN -continue holding nephrotoxic agents  -Continue Procardia and PRN hydralazine  5-GERD -continue PPI and famotidine   6-postoperative hypothyroidism  -continue synthroid   7-depression  -No  suicidal ideation or hallucination -Continue Zoloft.     8-thrombocytopenia -Oncology on board -Presumed to be associated with myelosuppression due to chemotherapeutic agents -No need for transfusion currently -Continue holding chemotherapeutic agents -Follow further recommendation by oncology service. -LDH has been ordered  9-AKI -Continue holding nephrotoxic agents -Continue IV fluids, but adjust rate -Advised to maintain good hydration -Creatinine today is back to normal.  10-prior hx of PE -continue xarelto for now  -no signs of acute bleeding -Platelets has continued trending down.  Code Status: Full code Family Communication: No family at bedside. Disposition Plan: Continue IVF's but adjust rate, okay to continue full liquid diet for now, renal function back to normal.  Platelets continue to trend down.  Oncology on board. HIDA scan has demonstrated possible biliary dyskinesia.  General surgery will see her tomorrow.   Consultants:  General surgery   Oncology service  Procedures:  See below for x-ray reports.  Antibiotics:  None  HPI/Subjective: No further nausea or vomiting; denies chest pain and shortness of breath.  Reports intermittent episode of mild epigastric discomfort but has been able to tolerate full liquid diets.  No signs of acute bleeding appreciated.  Objective: Vitals:   08/11/17 0558 08/11/17 1451  BP: (!) 152/91 (!) 158/83  Pulse: 83 80  Resp: 15 18  Temp: 99.3 F (37.4 C) 98.1 F (36.7 C)  SpO2: 98% 100%    Intake/Output Summary (Last 24 hours) at 08/11/2017 1646 Last data filed at 08/11/2017 0900 Gross per 24 hour  Intake 1590 ml  Output 900 ml  Net 690 ml   Filed Weights   08/09/17 0955 08/09/17 1604  Weight: 94.3 kg (208 lb) 96 kg (211 lb 10.3 oz)    Exam:   General: Low-grade fever overnight, no chest pain, no shortness of breath.  Patient denies any further nausea vomiting.  Still with mild intermittent epigastric  discomfort,  but was able to tolerate a full liquid diet.  No signs of acute bleeding.  Cardiovascular: S1 and S2, no rubs, no gallops, no murmurs.    Respiratory: Clear to auscultation bilaterally, no using accessory muscles, good oxygen saturation on room air.  Abdomen: Soft, nondistended, positive bowel sounds, no tenderness appreciated on palpation today; no guarding.  Musculoskeletal: No edema, no cyanosis, no clubbing.  Data Reviewed: Basic Metabolic Panel: Recent Labs  Lab 08/09/17 1052 08/09/17 1345 08/10/17 0504 08/11/17 0616  NA 139  --  141 137  K 3.8  --  4.1 3.8  CL 105  --  107 107  CO2 26  --  26 24  GLUCOSE 120*  --  105* 111*  BUN 18  --  18 18  CREATININE 1.21*  --  1.17* 0.89  CALCIUM 7.8*  --  8.0* 7.7*  MG  --  1.7  --   --   PHOS  --  3.3  --   --    Liver Function Tests: Recent Labs  Lab 08/09/17 1052 08/10/17 0504 08/11/17 0616  AST 19 18 13*  ALT 10* 10* 10*  ALKPHOS 125 120 97  BILITOT 2.1* 2.0* 1.6*  PROT 5.3* 5.4* 5.1*  ALBUMIN 2.7* 2.7* 2.3*   Recent Labs  Lab 08/09/17 1052  LIPASE 17   CBC: Recent Labs  Lab 08/09/17 1052 08/10/17 0504 08/11/17 0616  WBC 12.8* 6.6 5.2  NEUTROABS 11.4*  --   --   HGB 11.3* 11.6* 10.8*  HCT 35.2* 35.8* 33.9*  MCV 92.6 93.0 92.4  PLT 82* 62* 45*   Cardiac Enzymes: Recent Labs  Lab 08/09/17 1052  TROPONINI <0.03    CBG: Recent Labs  Lab 08/10/17 1148 08/10/17 1624 08/10/17 2151 08/11/17 0747 08/11/17 1250  GLUCAP 87 97 86 97 86    Studies: US Abdomen Complete  Result Date: 08/10/2017 CLINICAL DATA:  Elevated bilirubin level. EXAM: ABDOMEN ULTRASOUND COMPLETE COMPARISON:  CT scan of Aug 09, 2017. FINDINGS: Gallbladder: Multiple gallstones are noted with the largest measuring 1.1 cm. No gallbladder wall thickening or pericholecystic fluid is noted. No sonographic Murphy's sign is noted. Common bile duct: Diameter: 2.8 mm which is within normal limits. Liver: No focal lesion  identified. Within normal limits in parenchymal echogenicity. Portal vein is patent on color Doppler imaging with normal direction of blood flow towards the liver. IVC: No abnormality visualized. Pancreas: Not well visualized due to overlying bowel gas. Spleen: Size and appearance within normal limits. Right Kidney: Length: 11 cm. Echogenicity within normal limits. No mass or hydronephrosis visualized. Left Kidney: Length: 10.2 cm. Echogenicity within normal limits. No mass or hydronephrosis visualized. Abdominal aorta: No aneurysm visualized. Other findings: None. IMPRESSION: Cholelithiasis without inflammation. Pancreas not well visualized due to overlying bowel gas. No other abnormality seen in the abdomen. Electronically Signed   By: Marijo Conception, M.D.   On: 08/10/2017 14:32   Nm Hepato W/eject Fract  Result Date: 08/11/2017 CLINICAL DATA:  RIGHT upper quadrant pain for few days EXAM: NUCLEAR MEDICINE HEPATOBILIARY IMAGING WITH GALLBLADDER EF TECHNIQUE: Sequential images of the abdomen were obtained out to 60 minutes following intravenous administration of radiopharmaceutical. After slow intravenous infusion of 1.91 micrograms Cholecystokinin, gallbladder ejection fraction was determined. RADIOPHARMACEUTICALS:  5.5 mCi Tc-46m Choletec IV COMPARISON:  Ultrasound abdomen 08/10/2017 FINDINGS: Normal tracer extraction from bloodstream indicating normal hepatocellular function. Normal excretion of tracer into biliary tree. Gallbladder visualized at 25 min, though no additional tracer is seen within  the gallbladder until 58 minutes. Minimal tracer within gallbladder prior to CCK administration. Small bowel visualized at 12 min. No hepatic retention of tracer. Subjectively very little to no emptying of tracer from gallbladder following CCK administration. Calculated gallbladder ejection fraction is likely inaccurate due to superimposed small bowel loops with the gallbladder near the conclusion of the post CCK  images. Patient reported no symptoms following CCK administration. Normal gallbladder ejection fraction following CCK stimulation is greater than 40% at 1 hour. IMPRESSION: Patent biliary tree. Suboptimal filling of gallbladder prior to CCK with limited assessment of gallbladder emptying post CCK due to superimposed bowel and poor filling, unable to generate an accurate gallbladder ejection fraction but subjectively there is very poor/little emptying following CCK stimulation. Electronically Signed   By: Lavonia Dana M.D.   On: 08/11/2017 13:13    Scheduled Meds: . baclofen  5 mg Oral TID  . fluticasone  2 spray Each Nare Daily  . insulin aspart  0-9 Units Subcutaneous TID WC  . levothyroxine  137 mcg Oral QAC breakfast  . NIFEdipine  30 mg Oral Daily  . pantoprazole  40 mg Oral Daily  . rivaroxaban  20 mg Oral Daily  . senna  2 tablet Oral Daily  . sertraline  25 mg Oral Daily   Continuous Infusions: . sodium chloride 75 mL/hr at 08/11/17 1524  . famotidine (PEPCID) IV Stopped (08/10/17 2312)    Time spent: 30 minutes    [facility] Pager [phone]. If 7PM-7AM, please contact night-coverage at [email or web address], password [facility] 08/11/2017, 4:46 PM  LOS: 2 days

## 2017-08-12 DIAGNOSIS — K8020 Calculus of gallbladder without cholecystitis without obstruction: Secondary | ICD-10-CM

## 2017-08-12 DIAGNOSIS — K828 Other specified diseases of gallbladder: Secondary | ICD-10-CM

## 2017-08-12 LAB — GLUCOSE, CAPILLARY
GLUCOSE-CAPILLARY: 107 mg/dL — AB (ref 65–99)
Glucose-Capillary: 102 mg/dL — ABNORMAL HIGH (ref 65–99)
Glucose-Capillary: 104 mg/dL — ABNORMAL HIGH (ref 65–99)
Glucose-Capillary: 113 mg/dL — ABNORMAL HIGH (ref 65–99)

## 2017-08-12 LAB — CBC
HCT: 31.9 % — ABNORMAL LOW (ref 36.0–46.0)
Hemoglobin: 10.2 g/dL — ABNORMAL LOW (ref 12.0–15.0)
MCH: 29.4 pg (ref 26.0–34.0)
MCHC: 32 g/dL (ref 30.0–36.0)
MCV: 91.9 fL (ref 78.0–100.0)
PLATELETS: 46 10*3/uL — AB (ref 150–400)
RBC: 3.47 MIL/uL — AB (ref 3.87–5.11)
RDW: 14.9 % (ref 11.5–15.5)
WBC: 5.1 10*3/uL (ref 4.0–10.5)

## 2017-08-12 MED ORDER — NIFEDIPINE ER OSMOTIC RELEASE 30 MG PO TB24
30.0000 mg | ORAL_TABLET | Freq: Every day | ORAL | Status: DC
  Filled 2017-08-12: qty 1

## 2017-08-12 MED ORDER — LISINOPRIL 10 MG PO TABS
40.0000 mg | ORAL_TABLET | Freq: Every day | ORAL | Status: DC
  Administered 2017-08-13: 40 mg via ORAL
  Filled 2017-08-12: qty 4

## 2017-08-12 NOTE — Progress Notes (Signed)
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   Assessment & Plan:   Principal Problem:   Intractable nausea and vomiting Active Problems:   Hurthle cell carcinoma of thyroid (HCC)   Type 2 diabetes mellitus without complication (HCC)   GERD (gastroesophageal reflux disease)   Hypertension   Secondary malignant neoplasm of vertebral column (HCC)   Postoperative hypothyroidism   Depression   Thrombocytopenia (HCC)   Elevated bilirubin   AKI (acute kidney injury) ([place])   #) Abdominal pain: Unclear etiology however she describes it primarily as postprandial and epigastric.  CT scan on admission was negative. -Abnormal nuclear medicine study, general surgery consulted and is pending evaluation - Clear liquid diet, advance as tolerated -Gentle IV fluids  #) Thrombocytopenia: Patient has developed new thrombocytopenia from approximately 1 week ago.  No evidence of consumptive coagulopathy and very unlikely to be ITP in the setting.  Per oncology likely secondary to lenvatinib. -Hold lenvatinib -Monitor  #) Hypertension: - Restart lisinopril 20 mg daily -Continue nifedipine 30 mill grams daily -Hold furosemide 20 mg as needed  #) PE: -Continue rivaroxaban 20 mg daily, will consider discontinuing or evaluating once patient's platelets stabilize  #) Hypothyroidism: -Continue levothyroxine 137-micrograms  #) Pain/psych: -Continue baclofen 5 mg 3 times daily - Continue sertraline 25 mg daily  Fluids: Gentle IV fluids Elect lites: Monitor and supplement Nutrition: Clear liquid  diet  Prophylaxis: On rivaroxaban  Disposition: Pending surgical evaluation  Full code  Consultants:   General surgery  Heme-onc  Procedures: (Don't include imaging studies which can be auto populated. Include things that cannot be auto populated i.e. Echo, Carotid and venous dopplers, Foley, Bipap, HD, tubes/drains, wound vac, central lines etc)  08/11/2017 nuclear medicine HIDA scan: IMPRESSION: Patent biliary tree.  Suboptimal filling of gallbladder prior to CCK with limited assessment of gallbladder emptying post CCK due to superimposed bowel and poor filling, unable to generate an accurate gallbladder ejection fraction but subjectively there is very poor/little emptying following CCK stimulation.  Antimicrobials: (specify start and planned stop date. Auto populated tables are space occupying and do not give end dates)  None   Subjective: Patient reports that her abdominal pain is improved.  She is able to tolerate clear liquid diet somewhat.  She denies any nausea, vomiting, abdominal pain, constipation at this time.  She is eager to see if general surgery will have any recommendations.  Objective: Vitals:   08/10/17 2312 08/11/17 0558 08/11/17 1451 08/12/17 0458  BP:  (!) 152/91 (!) 158/83 136/81  Pulse:  83 80 72  Resp:  15 18 17   Temp: 99.6 F (37.6 C) 99.3 F (37.4 C) 98.1 F (36.7 C) 98.5 F (36.9 C)  TempSrc: Oral Oral Oral Oral  SpO2:  98% 100% 97%  Weight:      Height:        Intake/Output Summary (Last 24 hours) at 08/12/2017 1021 Last data filed at 08/12/2017 0500 Gross per 24 hour  Intake 912.5 ml  Output 600 ml  Net 312.5 ml   Filed Weights   08/09/17 0955 08/09/17 1604  Weight: 94.3 kg (208 lb) 96 kg (211 lb 10.3 oz)  Examination:  General exam: Appears calm and comfortable  Respiratory system: Clear to auscultation. Respiratory effort normal. Cardiovascular system: Regular rate and rhythm, no murmurs. Gastrointestinal system:  Soft, nondistended, no rebound or guarding, plus bowel sounds Central nervous system: Alert and oriented. No focal neurological deficits. Extremities: 1+ lower extremity edema Skin: No rashes visible skin Psychiatry: Judgement and insight appear normal. Mood & affect appropriate.     Data Reviewed: I have personally reviewed following labs and imaging studies  CBC: Recent Labs  Lab 08/09/17 1052 08/10/17 0504 08/11/17 0616 08/12/17 0430  WBC 12.8* 6.6 5.2 5.1  NEUTROABS 11.4*  --   --   --   HGB 11.3* 11.6* 10.8* 10.2*  HCT 35.2* 35.8* 33.9* 31.9*  MCV 92.6 93.0 92.4 91.9  PLT 82* 62* 45* 46*   Basic Metabolic Panel: Recent Labs  Lab 08/09/17 1052 08/09/17 1345 08/10/17 0504 08/11/17 0616  NA 139  --  141 137  K 3.8  --  4.1 3.8  CL 105  --  107 107  CO2 26  --  26 24  GLUCOSE 120*  --  105* 111*  BUN 18  --  18 18  CREATININE 1.21*  --  1.17* 0.89  CALCIUM 7.8*  --  8.0* 7.7*  MG  --  1.7  --   --   PHOS  --  3.3  --   --    GFR: Estimated Creatinine Clearance: 95.5 mL/min (by C-G formula based on SCr of 0.89 mg/dL). Liver Function Tests: Recent Labs  Lab 08/09/17 1052 08/10/17 0504 08/11/17 0616  AST 19 18 13*  ALT 10* 10* 10*  ALKPHOS 125 120 97  BILITOT 2.1* 2.0* 1.6*  PROT 5.3* 5.4* 5.1*  ALBUMIN 2.7* 2.7* 2.3*   Recent Labs  Lab 08/09/17 1052  LIPASE 17   No results for input(s): AMMONIA in the last 168 hours. Coagulation Profile: No results for input(s): INR, PROTIME in the last 168 hours. Cardiac Enzymes: Recent Labs  Lab 08/09/17 1052  TROPONINI <0.03   BNP (last 3 results) No results for input(s): PROBNP in the last 8760 hours. HbA1C: Recent Labs    08/09/17 1052  HGBA1C 5.5   CBG: Recent Labs  Lab 08/11/17 0747 08/11/17 1250 08/11/17 1648 08/11/17 2131 08/12/17 0747  GLUCAP 97 86 134* 100* 107*   Lipid Profile: No results for input(s): CHOL, HDL, LDLCALC, TRIG, CHOLHDL, LDLDIRECT in the last 72 hours. Thyroid  Function Tests: Recent Labs    08/09/17 1052  TSH 0.010*   Anemia Panel: No results for input(s): VITAMINB12, FOLATE, FERRITIN, TIBC, IRON, RETICCTPCT in the last 72 hours. Sepsis Labs: Recent Labs  Lab 08/09/17 1205 08/09/17 1325  LATICACIDVEN 1.3 1.1    No results found for this or any previous visit (from the past 240 hour(s)).       Radiology Studies: US Abdomen Complete  Result Date: 08/10/2017 CLINICAL DATA:  Elevated bilirubin level. EXAM: ABDOMEN ULTRASOUND COMPLETE COMPARISON:  CT scan of Aug 09, 2017. FINDINGS: Gallbladder: Multiple gallstones are noted with the largest measuring 1.1 cm. No gallbladder wall thickening or pericholecystic fluid is noted. No sonographic Murphy's sign is noted. Common bile duct: Diameter: 2.8 mm which is within normal limits. Liver: No focal lesion identified. Within normal limits in parenchymal echogenicity. Portal vein is patent on color Doppler imaging with normal direction of blood flow towards the liver. IVC: No abnormality visualized. Pancreas: Not well visualized due to overlying bowel gas. Spleen: Size and appearance  within normal limits. Right Kidney: Length: 11 cm. Echogenicity within normal limits. No mass or hydronephrosis visualized. Left Kidney: Length: 10.2 cm. Echogenicity within normal limits. No mass or hydronephrosis visualized. Abdominal aorta: No aneurysm visualized. Other findings: None. IMPRESSION: Cholelithiasis without inflammation. Pancreas not well visualized due to overlying bowel gas. No other abnormality seen in the abdomen. Electronically Signed   By: Marijo Conception, M.D.   On: 08/10/2017 14:32   Nm Hepato W/eject Fract  Result Date: 08/11/2017 CLINICAL DATA:  RIGHT upper quadrant pain for few days EXAM: NUCLEAR MEDICINE HEPATOBILIARY IMAGING WITH GALLBLADDER EF TECHNIQUE: Sequential images of the abdomen were obtained out to 60 minutes following intravenous administration of radiopharmaceutical. After slow  intravenous infusion of 1.91 micrograms Cholecystokinin, gallbladder ejection fraction was determined. RADIOPHARMACEUTICALS:  5.5 mCi Tc-73m Choletec IV COMPARISON:  Ultrasound abdomen 08/10/2017 FINDINGS: Normal tracer extraction from bloodstream indicating normal hepatocellular function. Normal excretion of tracer into biliary tree. Gallbladder visualized at 25 min, though no additional tracer is seen within the gallbladder until 58 minutes. Minimal tracer within gallbladder prior to CCK administration. Small bowel visualized at 12 min. No hepatic retention of tracer. Subjectively very little to no emptying of tracer from gallbladder following CCK administration. Calculated gallbladder ejection fraction is likely inaccurate due to superimposed small bowel loops with the gallbladder near the conclusion of the post CCK images. Patient reported no symptoms following CCK administration. Normal gallbladder ejection fraction following CCK stimulation is greater than 40% at 1 hour. IMPRESSION: Patent biliary tree. Suboptimal filling of gallbladder prior to CCK with limited assessment of gallbladder emptying post CCK due to superimposed bowel and poor filling, unable to generate an accurate gallbladder ejection fraction but subjectively there is very poor/little emptying following CCK stimulation. Electronically Signed   By: Lavonia Dana M.D.   On: 08/11/2017 13:13        Scheduled Meds: . baclofen  5 mg Oral TID  . fluticasone  2 spray Each Nare Daily  . insulin aspart  0-9 Units Subcutaneous TID WC  . levothyroxine  137 mcg Oral QAC breakfast  . NIFEdipine  30 mg Oral Daily  . pantoprazole  40 mg Oral Daily  . rivaroxaban  20 mg Oral Daily  . senna  2 tablet Oral Daily  . sertraline  25 mg Oral Daily   Continuous Infusions: . sodium chloride 75 mL/hr at 08/12/17 0352  . famotidine (PEPCID) IV Stopped (08/11/17 2222)     LOS: 3 days    Time spent: [place], MD [facility]   If 7PM-7AM, please contact night-coverage [email or web address] Password [facility] 08/12/2017, 10:21 AM

## 2017-08-12 NOTE — Consult Note (Addendum)
[place]  Reason for Consult: Gallstones/ Biliary dyskinesia  Referring Physician:  Dr. Dyann Kief   Chief Complaint    Nausea      Sarah [name] is a 52 y.o. female.  HPI: Sarah [name] is a 52 yo with a history of Hurtle cell thyroid cancer with metastasis, PE, HIT, DDD s/p back surgeries who came into the hospital with abdominal pain in the epigastric region and nausea/vomiting. CT was done that was relatively unremarkable except for a large gallbladder, US showed some stones and HIDA demonstrated no cholecystitis but some decreased ejection with CKK consistent with biliary dyskinesia. Initially some concern that some of the nausea/ vomiting coming from her chemotherapy, and she also has significant thrombocytopenia which is new and thought to be related to her chemotherapy.    Reports some 2+ year history with extensive workup of some intermittent epigastric pain with nausea/vomiting. She reports that it is not consistently with food and does not consistently associate with chemotherapy treatments. No NSAID use.   Past Medical History:  Diagnosis Date  . Cancer [facility])    FNA of thyroid positive for onconytic/hurthle cell carcinoma  . Chronic back pain   . DDD (degenerative disc disease), cervical   . DJD (degenerative joint disease)   . History of rectal fissure   . HIT (heparin-induced thrombocytopenia) ([place])   . Hypertension   . Obesity   . Paraplegia at T4 level ([place])   . Pulmonary embolus, right ([place]) 2015  . Rotator cuff tendonitis right    Past Surgical History:  Procedure Laterality Date  . LAMINECTOMY N/A 12/14/2013   Procedure: THORACIC LAMINECTOMY FOR TUMOR THORACIC THREE;  Surgeon: Ashok Pall, MD;  Location: [place] NEURO ORS;  Service: Neurosurgery;  Laterality: N/A;  THORACIC LAMINECTOMY FOR TUMOR THORACIC THREE  . LAMINECTOMY N/A 07/05/2015   Procedure: THORACIC LAMINECTOMY FOR TUMOR;  Surgeon: Ashok Pall, MD;  Location: [place] NEURO ORS;   Service: Neurosurgery;  Laterality: N/A;  . POSTERIOR LUMBAR FUSION 4 LEVEL N/A 12/30/2013   Procedure: Thoracic one-Thoracic five posterior thoracic fusion with pedicle screws;  Surgeon: Ashok Pall, MD;  Location: [place] NEURO ORS;  Service: Neurosurgery;  Laterality: N/A;  Thoracic one-Thoracic five posterior thoracic fusion with pedicle screws  . THYROIDECTOMY N/A 01/09/2014   Procedure: TOTAL THYROIDECTOMY;  Surgeon: Izora Gala, MD;  Location: [place];  Service: ENT;  Laterality: N/A;  . TONSILLECTOMY      Family History  Problem Relation Age of Onset  . Hypertension Mother   . Diabetes Mother   . Hypertension Father   . Stroke Father   . Diabetes Father   . Diabetes Sister   . Diabetes Sister     Social History   Tobacco Use  . Smoking status: Never Smoker  . Smokeless tobacco: Never Used  Substance Use Topics  . Alcohol use: No  . Drug use: No    Medications:  I have reviewed the patient's current medications. Prior to Admission:  Medications Prior to Admission  Medication Sig Dispense Refill Last Dose  . baclofen (LIORESAL) 20 MG tablet TAKE (1) TABLET THREE TIMES DAILY. 90 tablet 0 08/08/2017 at Unknown time  . colchicine 0.6 MG tablet TAKE&nbsp;&nbsp;(1)&nbsp;&nbsp;TABLET TWICE A DAY AS NEEDED. 30 tablet 3 Past Week at Unknown time  . fluticasone (FLONASE) 50 MCG/ACT nasal spray Place 2 sprays into both nostrils daily. 16 g 6 Past Week at Unknown time  . furosemide (LASIX) 20 MG tablet Take 1 tablet (20 mg total) by  mouth daily as needed. (Patient taking differently: Take 20 mg by mouth daily as needed for fluid. ) 30 tablet 1 Past Week at Unknown time  . LENVIMA 24 MG DAILY DOSE 10 (2) & 4 MG capsule TAKE 24 MG (TWO 10 MG CAPSULES AND ONE 4MG CAPSULE) BY MOUTH DAILY. 90 capsule 0 Past Week at Unknown time  . levothyroxine (SYNTHROID, LEVOTHROID) 137 MCG tablet Take 137 mcg by mouth daily before breakfast.   08/08/2017 at Unknown time  . lisinopril (PRINIVIL,ZESTRIL) 20 MG  tablet Take 2 tablets (40 mg total) by mouth daily. 60 tablet 1 Past Week at Unknown time  . NIFEdipine (PROCARDIA XL) 30 MG 24 hr tablet Take 1 tablet (30 mg total) by mouth daily. 30 tablet 1 Past Week at Unknown time  . ondansetron (ZOFRAN) 4 MG tablet Take 1 tablet (4 mg total) by mouth every 6 (six) hours. 30 tablet 5 08/09/2017 at Unknown time  . pantoprazole (PROTONIX) 40 MG tablet Take 1 tablet (40 mg total) by mouth daily. 30 tablet 2 Past Week at Unknown time  . rivaroxaban (XARELTO) 20 MG TABS tablet Take 1 tablet (20 mg total) by mouth daily. 30 tablet 6 Past Week at Unknown time  . senna (SENOKOT) 8.6 MG TABS tablet Take 2 tablets (17.2 mg total) by mouth daily. 120 each 0 Past Week at Unknown time  . sertraline (ZOLOFT) 25 MG tablet Take 1 tablet (25 mg total) by mouth daily. 30 tablet 3 Past Week at Unknown time   Scheduled: . baclofen  5 mg Oral TID  . fluticasone  2 spray Each Nare Daily  . insulin aspart  0-9 Units Subcutaneous TID WC  . levothyroxine  137 mcg Oral QAC breakfast  . lisinopril  40 mg Oral Daily  . NIFEdipine  30 mg Oral Daily  . pantoprazole  40 mg Oral Daily  . rivaroxaban  20 mg Oral Daily  . senna  2 tablet Oral Daily  . sertraline  25 mg Oral Daily   Continuous: . sodium chloride 75 mL/hr at 08/12/17 0352  . famotidine (PEPCID) IV Stopped (08/11/17 2222)   CBJ:[id number] **OR** acetaminophen, hydrALAZINE, MUSCLE RUB, ondansetron **OR** ondansetron (ZOFRAN) IV, promethazine  Allergies Allergies  Allergen Reactions  . Bee Venom Anaphylaxis  . Keflex [Cephalexin] Nausea And Vomiting  . Tramadol Nausea And Vomiting  . Gabapentin Nausea And Vomiting  . Heparin Other (See Comments)    Weak positive platelets induced antibodies, SRA negative--2015  . Hydrochlorothiazide Nausea And Vomiting  . Hydrocodone Nausea And Vomiting  . Oxycodone-Acetaminophen Nausea And Vomiting    ROS:  A comprehensive review of systems was negative except for:  Gastrointestinal: positive for abdominal pain, nausea and vomiting  Blood pressure 136/81, pulse 72, temperature 98.5 F (36.9 C), temperature source Oral, resp. rate 17, height 5' 11"  (1.803 m), weight 211 lb 10.3 oz (96 kg), last menstrual period 10/22/2016, SpO2 97 %. Physical Exam  Constitutional: She is oriented to person, place, and time. She appears well-developed and well-nourished.  HENT:  Head: Normocephalic.  Eyes: Pupils are equal, round, and reactive to light.  Neck: Neck supple.  Cardiovascular: Normal rate.  Pulmonary/Chest: Effort normal.  Abdominal: Soft. She exhibits no distension. There is no tenderness.  Full in the epigastric region  Musculoskeletal: She exhibits no edema.  Neurological: She is alert and oriented to person, place, and time.  Skin: Skin is warm and dry.  Psychiatric: She has a normal mood and affect. Her behavior is normal.  Judgment and thought content normal.  Vitals reviewed.   Results: Results for orders placed or performed during the hospital encounter of 08/09/17 (from the past 48 hour(s))  Glucose, capillary     Status: None   Collection Time: 08/10/17 11:48 AM  Result Value Ref Range   Glucose-Capillary 87 65 - 99 mg/dL   Comment 1 Notify RN   Glucose, capillary     Status: None   Collection Time: 08/10/17  4:24 PM  Result Value Ref Range   Glucose-Capillary 97 65 - 99 mg/dL   Comment 1 Notify RN   Glucose, capillary     Status: None   Collection Time: 08/10/17  9:51 PM  Result Value Ref Range   Glucose-Capillary 86 65 - 99 mg/dL  Comprehensive metabolic panel     Status: Abnormal   Collection Time: 08/11/17  6:16 AM  Result Value Ref Range   Sodium 137 135 - 145 mmol/L   Potassium 3.8 3.5 - 5.1 mmol/L   Chloride 107 101 - 111 mmol/L   CO2 24 22 - 32 mmol/L   Glucose, Bld 111 (H) 65 - 99 mg/dL   BUN 18 6 - 20 mg/dL   Creatinine, Ser 0.89 0.44 - 1.00 mg/dL   Calcium 7.7 (L) 8.9 - 10.3 mg/dL   Total Protein 5.1 (L) 6.5 - 8.1  g/dL   Albumin 2.3 (L) 3.5 - 5.0 g/dL   AST 13 (L) 15 - 41 U/L   ALT 10 (L) 14 - 54 U/L   Alkaline Phosphatase 97 38 - 126 U/L   Total Bilirubin 1.6 (H) 0.3 - 1.2 mg/dL   GFR calc non Af Amer >60 >60 mL/min   GFR calc Af Amer >60 >60 mL/min    Comment: (NOTE) The eGFR has been calculated using the CKD EPI equation. This calculation has not been validated in all clinical situations. eGFR's persistently <60 mL/min signify possible Chronic Kidney Disease.    Anion gap 6 5 - 15    Comment: Performed at [facility], [address] [place], [place] [address]  CBC     Status: Abnormal   Collection Time: 08/11/17  6:16 AM  Result Value Ref Range   WBC 5.2 4.0 - 10.5 K/uL   RBC 3.67 (L) 3.87 - 5.11 MIL/uL   Hemoglobin 10.8 (L) 12.0 - 15.0 g/dL   HCT 33.9 (L) 36.0 - 46.0 %   MCV 92.4 78.0 - 100.0 fL   MCH 29.4 26.0 - 34.0 pg   MCHC 31.9 30.0 - 36.0 g/dL   RDW 15.0 11.5 - 15.5 %   Platelets 45 (L) 150 - 400 K/uL    Comment: SPECIMEN CHECKED FOR CLOTS CONSISTENT WITH PREVIOUS RESULT Performed at [facility], [address], [place], [place] [address]   Lactate dehydrogenase     Status: None   Collection Time: 08/11/17  6:16 AM  Result Value Ref Range   LDH 103 98 - 192 U/L    Comment: Performed at [facility], [address], [place],  [address]  Glucose, capillary     Status: None   Collection Time: 08/11/17  7:47 AM  Result Value Ref Range   Glucose-Capillary 97 65 - 99 mg/dL   Comment 1 Notify RN   Glucose, capillary     Status: None   Collection Time: 08/11/17 12:50 PM  Result Value Ref Range   Glucose-Capillary 86 65 - 99 mg/dL  Glucose, capillary     Status: Abnormal   Collection  Time: 08/11/17  4:48 PM  Result Value Ref Range   Glucose-Capillary 134 (H) 65 - 99 mg/dL   Comment 1 Notify RN   Glucose, capillary     Status: Abnormal   Collection Time: 08/11/17  9:31 PM  Result Value Ref Range   Glucose-Capillary 100 (H) 65 - 99 mg/dL   Comment 1 Notify RN     Comment 2 Document in Chart   CBC     Status: Abnormal   Collection Time: 08/12/17  4:30 AM  Result Value Ref Range   WBC 5.1 4.0 - 10.5 K/uL   RBC 3.47 (L) 3.87 - 5.11 MIL/uL   Hemoglobin 10.2 (L) 12.0 - 15.0 g/dL   HCT 31.9 (L) 36.0 - 46.0 %   MCV 91.9 78.0 - 100.0 fL   MCH 29.4 26.0 - 34.0 pg   MCHC 32.0 30.0 - 36.0 g/dL   RDW 14.9 11.5 - 15.5 %   Platelets 46 (L) 150 - 400 K/uL    Comment: SPECIMEN CHECKED FOR CLOTS CONSISTENT WITH PREVIOUS RESULT Performed at [facility], [address] [place], [place] [address]   Glucose, capillary     Status: Abnormal   Collection Time: 08/12/17  7:47 AM  Result Value Ref Range   Glucose-Capillary 107 (H) 65 - 99 mg/dL   Comment 1 Notify RN    Comment 2 Document in Chart    Personally Reviewed [place] and HIDA- stones and some decreased function of the gallbladder, no signs of acute inflammation; CBD normal and no stones in CBD US Abdomen Complete  Result Date: 08/10/2017 CLINICAL DATA:  Elevated bilirubin level. EXAM: ABDOMEN ULTRASOUND COMPLETE COMPARISON:  CT scan of Aug 09, 2017. FINDINGS: Gallbladder: Multiple gallstones are noted with the largest measuring 1.1 cm. No gallbladder wall thickening or pericholecystic fluid is noted. No sonographic Murphy's sign is noted. Common bile duct: Diameter: 2.8 mm which is within normal limits. Liver: No focal lesion identified. Within normal limits in parenchymal echogenicity. Portal vein is patent on color Doppler imaging with normal direction of blood flow towards the liver. IVC: No abnormality visualized. Pancreas: Not well visualized due to overlying bowel gas. Spleen: Size and appearance within normal limits. Right Kidney: Length: 11 cm. Echogenicity within normal limits. No mass or hydronephrosis visualized. Left Kidney: Length: 10.2 cm. Echogenicity within normal limits. No mass or hydronephrosis visualized. Abdominal aorta: No aneurysm visualized. Other findings: None. IMPRESSION: Cholelithiasis  without inflammation. Pancreas not well visualized due to overlying bowel gas. No other abnormality seen in the abdomen. Electronically Signed   By: Marijo Conception, M.D.   On: 08/10/2017 14:32   Nm Hepato W/eject Fract  Result Date: 08/11/2017 CLINICAL DATA:  RIGHT upper quadrant pain for few days EXAM: NUCLEAR MEDICINE HEPATOBILIARY IMAGING WITH GALLBLADDER EF TECHNIQUE: Sequential images of the abdomen were obtained out to 60 minutes following intravenous administration of radiopharmaceutical. After slow intravenous infusion of 1.91 micrograms Cholecystokinin, gallbladder ejection fraction was determined. RADIOPHARMACEUTICALS:  5.5 mCi Tc-42mCholetec IV COMPARISON:  Ultrasound abdomen 08/10/2017 FINDINGS: Normal tracer extraction from bloodstream indicating normal hepatocellular function. Normal excretion of tracer into biliary tree. Gallbladder visualized at 25 min, though no additional tracer is seen within the gallbladder until 58 minutes. Minimal tracer within gallbladder prior to CCK administration. Small bowel visualized at 12 min. No hepatic retention of tracer. Subjectively very little to no emptying of tracer from gallbladder following CCK administration. Calculated gallbladder ejection fraction is likely inaccurate due to superimposed small bowel loops with  the gallbladder near the conclusion of the post CCK images. Patient reported no symptoms following CCK administration. Normal gallbladder ejection fraction following CCK stimulation is greater than 40% at 1 hour. IMPRESSION: Patent biliary tree. Suboptimal filling of gallbladder prior to CCK with limited assessment of gallbladder emptying post CCK due to superimposed bowel and poor filling, unable to generate an accurate gallbladder ejection fraction but subjectively there is very poor/little emptying following CCK stimulation. Electronically Signed   By: Lavonia Dana M.D.   On: 08/11/2017 13:13    Assessment & Plan:  Sarah [name] is a 52  y.o. female with likely biliary colic / biliary dyskinesia that is intermittently causing abdominal pain/ nausea/vomiting. She has had some similar episodes in the past but has never had gallstones diagnosed. She also has thrombocytopenia at this time of unknown significance/ etiology but possibly related to her chemotherapy.   T bili had been elevated and trending down but CBD without stone, may have passed something?   Discussed that given the thrombocytopenia, no surgical intervention is needed or necessary at this time and a cholecystectomy could be done on an elective basis once this has resolved/ improved. Most surgeons would want 100+ platelet for an elective surgery.   Discussed that the abdominal pain/ nausea/vomiting could be coming from the gallbladder versus from another etiology like gastritis or constipation or from IBS.  Removing the gallbladder could help with the symptoms or could not improve the symptoms at all, esp with her intermittent and varying occurrences.   At this time recommended diet as tolerated, hydration is the most important component.   Follow up as an outpatient for discussion of surgery for the gallbladder.  She has all of her physicians at [facility], and may want to follow with [facility] for possible elective cholecystectomy.    Pending patient's ultimately preference can be referred to me or [facility] for discussion of elective lap chole.   All questions were answered to the satisfaction of the patient and family.  Virl Cagey 08/12/2017, 11:32 AM

## 2017-08-13 LAB — CBC
HCT: 32.6 % — ABNORMAL LOW (ref 36.0–46.0)
Hemoglobin: 10.5 g/dL — ABNORMAL LOW (ref 12.0–15.0)
MCH: 29.4 pg (ref 26.0–34.0)
MCHC: 32.2 g/dL (ref 30.0–36.0)
MCV: 91.3 fL (ref 78.0–100.0)
Platelets: 51 10*3/uL — ABNORMAL LOW (ref 150–400)
RBC: 3.57 MIL/uL — ABNORMAL LOW (ref 3.87–5.11)
RDW: 14.7 % (ref 11.5–15.5)
WBC: 4 K/uL (ref 4.0–10.5)

## 2017-08-13 LAB — GLUCOSE, CAPILLARY
GLUCOSE-CAPILLARY: 101 mg/dL — AB (ref 65–99)
Glucose-Capillary: 82 mg/dL (ref 65–99)

## 2017-08-13 MED ORDER — FONDAPARINUX SODIUM 2.5 MG/0.5ML ~~LOC~~ SOLN: 2.5000 mg | Freq: Every day | SUBCUTANEOUS | 3 refills | Status: DC

## 2017-08-13 MED ORDER — FONDAPARINUX (ARIXTRA) PATIENT EDUCATION KIT
PACK | Freq: Once | Status: AC
  Administered 2017-08-13: 11:00:00
  Filled 2017-08-13: qty 1

## 2017-08-13 MED ORDER — FONDAPARINUX SODIUM 2.5 MG/0.5ML ~~LOC~~ SOLN
2.5000 mg | Freq: Every day | SUBCUTANEOUS | Status: DC
  Administered 2017-08-13: 2.5 mg via SUBCUTANEOUS
  Filled 2017-08-13 (×2): qty 0.5

## 2017-08-13 NOTE — Care Management (Signed)
Benefits check:  Co-pay for Arixtra 2.5 mg SQ daily (Generic name fondaparinux) zero co-pay, for 30 day supply.  No PA required.  Pharmacies are :[facility] order.

## 2017-08-13 NOTE — Progress Notes (Signed)
Patient discharged home with instructions given on medications,and follow up visits patient verbalized understanding.Prescriptions sent with patient. Accompanied by staff to an awaiting vehicle.

## 2017-08-13 NOTE — Care Management Note (Signed)
Case Management Note  Patient Details  Name: Sarah [name] MRN: [id number] Date of Birth: 14-Nov-1965  Action/Plan:  DC home today. Pt requests [place] nursing for injection education. Pt would like to use the same HHA she used in the past, can not remember the name of the company. CM has reviewed previous admissions and pt was referred to [facility]. Pt aware HH has 48 hrs to make first visit. Blake Divine, [facility] rep, aware of referral.   Expected Discharge Date:  08/13/17               Expected Discharge Plan:  [place]  In-House Referral:  NA  Discharge planning Services  CM Consult  Post Acute Care Choice:  Home Health Choice offered to:  Patient  DME Arranged:    DME Agency:     HH Arranged:  RN [place] Agency:  [facility]  Status of Service:  Completed, signed off  If discussed at Power of Stay Meetings, dates discussed:    Additional Comments:  Sherald Barge, RN 08/13/2017, 1:49 PM

## 2017-08-13 NOTE — Discharge Summary (Signed)
Physician Discharge Summary  Sarah [name] SAY:[id number] DOB: [birth year] DOA: 08/09/2017  PCP: [name], MD  Admit date: 08/09/2017 Discharge date: 08/13/2017  Admitted From: Home Disposition: Home  Recommendations for Outpatient Follow-up:  1. Follow up with PCP in 1-2 weeks 2. Please obtain BMP/CBC in one week 3. Please hold rivaroxaban and Lenvima until you see your oncologist.  Please take the fondaparinux until you see your oncologist. 4. Please make an appointment with [place] [facility] to see when you can have your gallbladder removed.  Home Health: No Equipment/Devices: No   Discharge Condition: Stable CODE STATUS: Full Diet recommendation: Regular diet  Brief/Interim Summary:  #) Biliary colic: Patient was admitted with severe abdominal pain that was largely postprandial.  Patient had a HIDA scan that was somewhat limited by bowel loops however was felt to be abnormal.  Patient was evaluated by general surgery who felt that with patient being on rivaroxaban and her new onset thrombocytopenia that she could safely be deferred to his outpatient.  She was started on a liquid diet and advance to a solid diet that was bland without any problems.  She will follow-up with [facility] as an outpatient for elective cholecystectomy.  #) Thrombocytopenia: Patient was admitted with new onset thrombocytopenia with platelets initially in the 80s that nadir at around to 50s.  Oncology was consulted and felt that it could be related to patient's Lenvima.  This was held.  It was also discussed with patient's outpatient oncologist [name] about thrombus cytopenia with patient being on rivaroxaban.  It was decided to hold rivaroxaban and transition patient to prophylactic dose fondaparinux while she was thrombus cytopenic and he would follow-up as an outpatient.  He also reported that patient could continue to hold Lenvima until she was seen in clinic by him.  Patient has a  remote history of a positive hit antibody in 2015 with no serotonin releasing assay.  #) Pulmonary embolism: Patient was transitioned from rivaroxaban to 2.5 mg daily of fondaparinux prophylactic dose while she was from cytopenic.  #) Hypo-thyroidism: Patient was continued on levothyroxine 137 mcg daily.  #)  hypertension: Patient was continued on lisinopril 40 mg and nifedipine 30 mg.  #) Pain/psych: Patient was continued on sertraline 25 mg daily and baclofen 3 times daily.    #) Metastatic Hurthle cell carcinoma: Per above patient's chemotherapy medication was held due to thrombus cytopenia.  This will be restarted at the discretion of her oncologist as an outpatient.   Discharge Diagnoses:  Principal Problem:   Intractable nausea and vomiting Active Problems:   Hurthle cell carcinoma of thyroid (HCC)   Type 2 diabetes mellitus without complication (HCC)   GERD (gastroesophageal reflux disease)   Hypertension   Secondary malignant neoplasm of vertebral column (HCC)   Postoperative hypothyroidism   Depression   Thrombocytopenia (HCC)   Elevated bilirubin   AKI (acute kidney injury) ([place])   Gallstones   Biliary dyskinesia    Discharge Instructions  Discharge Instructions    Call MD for:  difficulty breathing, headache or visual disturbances   Complete by:  As directed    Call MD for:  persistant nausea and vomiting   Complete by:  As directed    Call MD for:  redness, tenderness, or signs of infection (pain, swelling, redness, odor or green/yellow discharge around incision site)   Complete by:  As directed    Call MD for:  severe uncontrolled pain   Complete by:  As directed  Call MD for:  temperature >100.4   Complete by:  As directed    Diet - low sodium heart healthy   Complete by:  As directed    Discharge instructions   Complete by:  As directed    Please follow-up with your PCP in 1 to 2 weeks to have your blood work including her platelets checked..  Please  follow-up with [name] in 1 to 2 weeks to determine if you can restart your rivaroxaban and your chemotherapy drug.   Increase activity slowly   Complete by:  As directed      Allergies as of 08/13/2017      Reactions   Bee Venom Anaphylaxis   Keflex [cephalexin] Nausea And Vomiting   Tramadol Nausea And Vomiting   Gabapentin Nausea And Vomiting   Heparin Other (See Comments)   Weak positive platelets induced antibodies, SRA negative--2015   Hydrochlorothiazide Nausea And Vomiting   Hydrocodone Nausea And Vomiting   Oxycodone-acetaminophen Nausea And Vomiting      Medication List    STOP taking these medications   LENVIMA 24 MG DAILY DOSE 10 (2) & 4 MG capsule Generic drug:  lenvatinib 24 mg daily dose   rivaroxaban 20 MG Tabs tablet Commonly known as:  XARELTO     TAKE these medications   baclofen 20 MG tablet Commonly known as:  LIORESAL TAKE (1) TABLET THREE TIMES DAILY.   colchicine 0.6 MG tablet TAKE&nbsp;&nbsp;(1)&nbsp;&nbsp;TABLET TWICE A DAY AS NEEDED.   fluticasone 50 MCG/ACT nasal spray Commonly known as:  FLONASE Place 2 sprays into both nostrils daily.   fondaparinux 2.5 MG/0.5ML Soln injection Commonly known as:  ARIXTRA Inject 0.5 mLs (2.5 mg total) into the skin daily at 6 (six) AM. Start taking on:  08/14/2017   furosemide 20 MG tablet Commonly known as:  LASIX Take 1 tablet (20 mg total) by mouth daily as needed. What changed:  reasons to take this   levothyroxine 137 MCG tablet Commonly known as:  SYNTHROID, LEVOTHROID Take 137 mcg by mouth daily before breakfast.   lisinopril 20 MG tablet Commonly known as:  PRINIVIL,ZESTRIL Take 2 tablets (40 mg total) by mouth daily.   NIFEdipine 30 MG 24 hr tablet Commonly known as:  PROCARDIA XL Take 1 tablet (30 mg total) by mouth daily.   ondansetron 4 MG tablet Commonly known as:  ZOFRAN Take 1 tablet (4 mg total) by mouth every 6 (six) hours.   pantoprazole 40 MG tablet Commonly known as:   PROTONIX Take 1 tablet (40 mg total) by mouth daily.   senna 8.6 MG Tabs tablet Commonly known as:  SENOKOT Take 2 tablets (17.2 mg total) by mouth daily.   sertraline 25 MG tablet Commonly known as:  ZOLOFT Take 1 tablet (25 mg total) by mouth daily.       Allergies  Allergen Reactions  . Bee Venom Anaphylaxis  . Keflex [Cephalexin] Nausea And Vomiting  . Tramadol Nausea And Vomiting  . Gabapentin Nausea And Vomiting  . Heparin Other (See Comments)    Weak positive platelets induced antibodies, SRA negative--2015  . Hydrochlorothiazide Nausea And Vomiting  . Hydrocodone Nausea And Vomiting  . Oxycodone-Acetaminophen Nausea And Vomiting    Consultations:  General surgery Dr. Constance Haw   Procedures/Studies: Ct Abdomen Pelvis Wo Contrast  Result Date: 08/09/2017 CLINICAL DATA:  Per ED notes: Pt presents today for nausea x 2 days. Took zofran at home with no relief. Pt denies diarrhea. Pt take oral chemo for thyroid  CA with mets. Pt denies being around anyone with similar symptoms. EXAM: CT ABDOMEN AND PELVIS WITHOUT CONTRAST TECHNIQUE: Multidetector CT imaging of the abdomen and pelvis was performed following the standard protocol without IV contrast. COMPARISON:  PET-CT 05/20/2017 and previous FINDINGS: Lower chest: Lower lobe pulmonary nodules up to 5 mm, stable since previous. No pleural or pericardial effusion. Hepatobiliary: Gallbladder is distended with small partially calcified stones in its dependent aspect. Unremarkable liver. Pancreas: Unremarkable. No pancreatic ductal dilatation or surrounding inflammatory changes. Spleen: Normal in size without focal abnormality. Adrenals/Urinary Tract: Adrenal glands are unremarkable. Kidneys are normal, without renal calculi, focal lesion, or hydronephrosis. Bladder is unremarkable. Stomach/Bowel: Stomach is nondistended. Small bowel is nondilated. Colon is nondilated with a few scattered diverticula. Vascular/Lymphatic: No significant  vascular findings are present. No enlarged abdominal or pelvic lymph nodes. Reproductive: Uterus and bilateral adnexa are unremarkable. Other: No ascites.  No free air. Musculoskeletal: Spondylitic changes throughout the lumbar spine. Bilateral hip degenerative spurring. No fracture or worrisome bone lesion. IMPRESSION: 1. No acute findings. 2. Stable small pulmonary nodules at the visualized lung bases. Electronically Signed   By: Lucrezia Europe M.D.   On: 08/09/2017 13:54   US Abdomen Complete  Result Date: 08/10/2017 CLINICAL DATA:  Elevated bilirubin level. EXAM: ABDOMEN ULTRASOUND COMPLETE COMPARISON:  CT scan of Aug 09, 2017. FINDINGS: Gallbladder: Multiple gallstones are noted with the largest measuring 1.1 cm. No gallbladder wall thickening or pericholecystic fluid is noted. No sonographic Murphy's sign is noted. Common bile duct: Diameter: 2.8 mm which is within normal limits. Liver: No focal lesion identified. Within normal limits in parenchymal echogenicity. Portal vein is patent on color Doppler imaging with normal direction of blood flow towards the liver. IVC: No abnormality visualized. Pancreas: Not well visualized due to overlying bowel gas. Spleen: Size and appearance within normal limits. Right Kidney: Length: 11 cm. Echogenicity within normal limits. No mass or hydronephrosis visualized. Left Kidney: Length: 10.2 cm. Echogenicity within normal limits. No mass or hydronephrosis visualized. Abdominal aorta: No aneurysm visualized. Other findings: None. IMPRESSION: Cholelithiasis without inflammation. Pancreas not well visualized due to overlying bowel gas. No other abnormality seen in the abdomen. Electronically Signed   By: Marijo Conception, M.D.   On: 08/10/2017 14:32   Nm Hepato W/eject Fract  Result Date: 08/11/2017 CLINICAL DATA:  RIGHT upper quadrant pain for few days EXAM: NUCLEAR MEDICINE HEPATOBILIARY IMAGING WITH GALLBLADDER EF TECHNIQUE: Sequential images of the abdomen were obtained  out to 60 minutes following intravenous administration of radiopharmaceutical. After slow intravenous infusion of 1.91 micrograms Cholecystokinin, gallbladder ejection fraction was determined. RADIOPHARMACEUTICALS:  5.5 mCi Tc-58m Choletec IV COMPARISON:  Ultrasound abdomen 08/10/2017 FINDINGS: Normal tracer extraction from bloodstream indicating normal hepatocellular function. Normal excretion of tracer into biliary tree. Gallbladder visualized at 25 min, though no additional tracer is seen within the gallbladder until 58 minutes. Minimal tracer within gallbladder prior to CCK administration. Small bowel visualized at 12 min. No hepatic retention of tracer. Subjectively very little to no emptying of tracer from gallbladder following CCK administration. Calculated gallbladder ejection fraction is likely inaccurate due to superimposed small bowel loops with the gallbladder near the conclusion of the post CCK images. Patient reported no symptoms following CCK administration. Normal gallbladder ejection fraction following CCK stimulation is greater than 40% at 1 hour. IMPRESSION: Patent biliary tree. Suboptimal filling of gallbladder prior to CCK with limited assessment of gallbladder emptying post CCK due to superimposed bowel and poor filling, unable to generate an accurate  gallbladder ejection fraction but subjectively there is very poor/little emptying following CCK stimulation. Electronically Signed   By: Lavonia Dana M.D.   On: 08/11/2017 13:13     Subjective:   Discharge Exam: Vitals:   08/12/17 2125 08/13/17 0502  BP: 131/90 137/74  Pulse: 83 67  Resp:    Temp: 99.9 F (37.7 C) 98.7 F (37.1 C)  SpO2: 98% 100%   Vitals:   08/12/17 1152 08/12/17 1418 08/12/17 2125 08/13/17 0502  BP: 138/83 (!) 147/81 131/90 137/74  Pulse: 87 95 83 67  Resp:  18    Temp:  100.3 F (37.9 C) 99.9 F (37.7 C) 98.7 F (37.1 C)  TempSrc:  Oral Oral Oral  SpO2:  100% 98% 100%  Weight:      Height:         General exam: Appears calm and comfortable  Respiratory system: Clear to auscultation. Respiratory effort normal. Cardiovascular system: Regular rate and rhythm, no murmurs. Gastrointestinal system: Soft, nondistended, no rebound or guarding, plus bowel sounds Central nervous system: Alert and oriented. No focal neurological deficits. Extremities: 1+ lower extremity edema Skin: No rashes visible skin Psychiatry: Judgement and insight appear normal. Mood & affect appropriate.        The results of significant diagnostics from this hospitalization (including imaging, microbiology, ancillary and laboratory) are listed below for reference.     Microbiology: No results found for this or any previous visit (from the past 240 hour(s)).   Labs: BNP (last 3 results) No results for input(s): BNP in the last 8760 hours. Basic Metabolic Panel: Recent Labs  Lab 08/09/17 1052 08/09/17 1345 08/10/17 0504 08/11/17 0616  NA 139  --  141 137  K 3.8  --  4.1 3.8  CL 105  --  107 107  CO2 26  --  26 24  GLUCOSE 120*  --  105* 111*  BUN 18  --  18 18  CREATININE 1.21*  --  1.17* 0.89  CALCIUM 7.8*  --  8.0* 7.7*  MG  --  1.7  --   --   PHOS  --  3.3  --   --    Liver Function Tests: Recent Labs  Lab 08/09/17 1052 08/10/17 0504 08/11/17 0616  AST 19 18 13*  ALT 10* 10* 10*  ALKPHOS 125 120 97  BILITOT 2.1* 2.0* 1.6*  PROT 5.3* 5.4* 5.1*  ALBUMIN 2.7* 2.7* 2.3*   Recent Labs  Lab 08/09/17 1052  LIPASE 17   No results for input(s): AMMONIA in the last 168 hours. CBC: Recent Labs  Lab 08/09/17 1052 08/10/17 0504 08/11/17 0616 08/12/17 0430 08/13/17 0636  WBC 12.8* 6.6 5.2 5.1 4.0  NEUTROABS 11.4*  --   --   --   --   HGB 11.3* 11.6* 10.8* 10.2* 10.5*  HCT 35.2* 35.8* 33.9* 31.9* 32.6*  MCV 92.6 93.0 92.4 91.9 91.3  PLT 82* 62* 45* 46* 51*   Cardiac Enzymes: Recent Labs  Lab 08/09/17 1052  TROPONINI <0.03   BNP: Invalid input(s): POCBNP CBG: Recent  Labs  Lab 08/12/17 0747 08/12/17 1149 08/12/17 1637 08/12/17 2126 08/13/17 0802  GLUCAP 107* 102* 104* 113* 82   D-Dimer No results for input(s): DDIMER in the last 72 hours. Hgb A1c No results for input(s): HGBA1C in the last 72 hours. Lipid Profile No results for input(s): CHOL, HDL, LDLCALC, TRIG, CHOLHDL, LDLDIRECT in the last 72 hours. Thyroid function studies No results for input(s): TSH, T4TOTAL, T3FREE,  THYROIDAB in the last 72 hours.  Invalid input(s): FREET3 Anemia work up No results for input(s): VITAMINB12, FOLATE, FERRITIN, TIBC, IRON, RETICCTPCT in the last 72 hours. Urinalysis    Component Value Date/Time   COLORURINE STRAW (A) 07/26/2016 2254   APPEARANCEUR CLEAR 07/26/2016 2254   LABSPEC 1.013 07/26/2016 2254   PHURINE 5.0 07/26/2016 2254   GLUCOSEU NEGATIVE 07/26/2016 2254   HGBUR NEGATIVE 07/26/2016 [place] 07/26/2016 2254   BILIRUBINUR MODERATE 03/03/2014 1028   KETONESUR NEGATIVE 07/26/2016 2254   PROTEINUR NEGATIVE 07/26/2016 2254   UROBILINOGEN 1.0 03/11/2014 2130   NITRITE NEGATIVE 07/26/2016 2254   LEUKOCYTESUR NEGATIVE 07/26/2016 2254   Sepsis Labs Invalid input(s): PROCALCITONIN,  WBC,  LACTICIDVEN Microbiology No results found for this or any previous visit (from the past 240 hour(s)).   Time coordinating discharge: Over 30 minutes  SIGNED:   Cristy Folks, MD  [facility] 08/13/2017, 10:54 AM   If 7PM-7AM, please contact night-coverage [email or web address] Password [email or web address]

## 2017-08-14 DIAGNOSIS — T451X5D Adverse effect of antineoplastic and immunosuppressive drugs, subsequent encounter: Secondary | ICD-10-CM | POA: Diagnosis not present

## 2017-08-14 DIAGNOSIS — K828 Other specified diseases of gallbladder: Secondary | ICD-10-CM | POA: Diagnosis not present

## 2017-08-14 DIAGNOSIS — D696 Thrombocytopenia, unspecified: Secondary | ICD-10-CM | POA: Diagnosis not present

## 2017-08-14 DIAGNOSIS — I10 Essential (primary) hypertension: Secondary | ICD-10-CM | POA: Diagnosis not present

## 2017-08-14 DIAGNOSIS — Z86711 Personal history of pulmonary embolism: Secondary | ICD-10-CM | POA: Diagnosis not present

## 2017-08-14 DIAGNOSIS — C73 Malignant neoplasm of thyroid gland: Secondary | ICD-10-CM | POA: Diagnosis not present

## 2017-08-14 DIAGNOSIS — K8050 Calculus of bile duct without cholangitis or cholecystitis without obstruction: Secondary | ICD-10-CM | POA: Diagnosis not present

## 2017-08-14 DIAGNOSIS — E039 Hypothyroidism, unspecified: Secondary | ICD-10-CM | POA: Diagnosis not present

## 2017-08-14 DIAGNOSIS — E119 Type 2 diabetes mellitus without complications: Secondary | ICD-10-CM | POA: Diagnosis not present

## 2017-08-14 DIAGNOSIS — C7951 Secondary malignant neoplasm of bone: Secondary | ICD-10-CM | POA: Diagnosis not present

## 2017-08-18 DIAGNOSIS — I10 Essential (primary) hypertension: Secondary | ICD-10-CM | POA: Diagnosis not present

## 2017-08-18 DIAGNOSIS — K828 Other specified diseases of gallbladder: Secondary | ICD-10-CM | POA: Diagnosis not present

## 2017-08-18 DIAGNOSIS — E039 Hypothyroidism, unspecified: Secondary | ICD-10-CM | POA: Diagnosis not present

## 2017-08-18 DIAGNOSIS — C7951 Secondary malignant neoplasm of bone: Secondary | ICD-10-CM | POA: Diagnosis not present

## 2017-08-18 DIAGNOSIS — K8050 Calculus of bile duct without cholangitis or cholecystitis without obstruction: Secondary | ICD-10-CM | POA: Diagnosis not present

## 2017-08-18 DIAGNOSIS — E119 Type 2 diabetes mellitus without complications: Secondary | ICD-10-CM | POA: Diagnosis not present

## 2017-08-18 DIAGNOSIS — C73 Malignant neoplasm of thyroid gland: Secondary | ICD-10-CM | POA: Diagnosis not present

## 2017-08-18 DIAGNOSIS — Z86711 Personal history of pulmonary embolism: Secondary | ICD-10-CM | POA: Diagnosis not present

## 2017-08-18 DIAGNOSIS — D696 Thrombocytopenia, unspecified: Secondary | ICD-10-CM | POA: Diagnosis not present

## 2017-08-18 DIAGNOSIS — T451X5D Adverse effect of antineoplastic and immunosuppressive drugs, subsequent encounter: Secondary | ICD-10-CM | POA: Diagnosis not present

## 2017-08-19 ENCOUNTER — Inpatient Hospital Stay: Payer: Medicare Other | Admitting: Family Medicine

## 2017-08-20 DIAGNOSIS — E119 Type 2 diabetes mellitus without complications: Secondary | ICD-10-CM | POA: Diagnosis not present

## 2017-08-20 DIAGNOSIS — K828 Other specified diseases of gallbladder: Secondary | ICD-10-CM | POA: Diagnosis not present

## 2017-08-20 DIAGNOSIS — C73 Malignant neoplasm of thyroid gland: Secondary | ICD-10-CM | POA: Diagnosis not present

## 2017-08-20 DIAGNOSIS — D696 Thrombocytopenia, unspecified: Secondary | ICD-10-CM | POA: Diagnosis not present

## 2017-08-20 DIAGNOSIS — K8050 Calculus of bile duct without cholangitis or cholecystitis without obstruction: Secondary | ICD-10-CM | POA: Diagnosis not present

## 2017-08-20 DIAGNOSIS — C7951 Secondary malignant neoplasm of bone: Secondary | ICD-10-CM | POA: Diagnosis not present

## 2017-08-20 DIAGNOSIS — Z86711 Personal history of pulmonary embolism: Secondary | ICD-10-CM | POA: Diagnosis not present

## 2017-08-20 DIAGNOSIS — I10 Essential (primary) hypertension: Secondary | ICD-10-CM | POA: Diagnosis not present

## 2017-08-20 DIAGNOSIS — E039 Hypothyroidism, unspecified: Secondary | ICD-10-CM | POA: Diagnosis not present

## 2017-08-20 DIAGNOSIS — T451X5D Adverse effect of antineoplastic and immunosuppressive drugs, subsequent encounter: Secondary | ICD-10-CM | POA: Diagnosis not present

## 2017-08-21 ENCOUNTER — Other Ambulatory Visit: Payer: Self-pay

## 2017-08-21 ENCOUNTER — Telehealth: Payer: Self-pay

## 2017-08-21 ENCOUNTER — Encounter: Payer: Self-pay | Admitting: Internal Medicine

## 2017-08-21 ENCOUNTER — Ambulatory Visit: Payer: Medicare Other | Admitting: Physical Therapy

## 2017-08-21 ENCOUNTER — Ambulatory Visit (INDEPENDENT_AMBULATORY_CARE_PROVIDER_SITE_OTHER): Payer: Medicare Other | Admitting: Internal Medicine

## 2017-08-21 VITALS — BP 98/78 | HR 89 | Temp 98.0°F

## 2017-08-21 DIAGNOSIS — C73 Malignant neoplasm of thyroid gland: Secondary | ICD-10-CM | POA: Diagnosis not present

## 2017-08-21 DIAGNOSIS — D696 Thrombocytopenia, unspecified: Secondary | ICD-10-CM | POA: Diagnosis not present

## 2017-08-21 DIAGNOSIS — K8020 Calculus of gallbladder without cholecystitis without obstruction: Secondary | ICD-10-CM | POA: Diagnosis not present

## 2017-08-21 MED ORDER — FONDAPARINUX SODIUM 2.5 MG/0.5ML ~~LOC~~ SOLN: 2.5000 mg | Freq: Every day | SUBCUTANEOUS | 3 refills | Status: DC

## 2017-08-21 NOTE — Addendum Note (Signed)
Addended by: Kerrin Mo Z on: 08/21/2017 12:14 PM   Modules accepted: Orders

## 2017-08-21 NOTE — Assessment & Plan Note (Addendum)
Evaluated and diagnosed with cholelithiasis, given patient was found to have significant thrombocytopenia-surgery recommended follow-up as an outpatient for surgical removal of gallbladder.  Patient still notes having some nausea associated with eating however denies any right upper quadrant pain, notes some very mild left upper quadrant pain but feels like this is due to constipation she has not had a bowel movement in 5 days - Will place an urgent referral for gallbladder removal - Miralax for constipation  - Return precautions provided

## 2017-08-21 NOTE — Progress Notes (Signed)
   [facility] Kerrin Mo, MD Phone: [phone]  Reason For Visit: Hospital Follow up  # Hospital Follow up  Patient was diagnosised with gallestones and also had thrombocytopenia during recent hospital stay - discharged 5/23. Discharge plan included follow up with [facility] for her gallbladder and Onoclogy for the thrombocytopenia. [name] currently an appointmet with her oncologist. Patient does have Hurthle Cell Carcinoma - some worry that Lenvima she takes has cause the thrombocytopenia. Therefore oncology recommended holding rivaroxaban and Lenvima. Patient states she having more left side abdominal not right upper quadrant pain. She has been feeling nauseated, not vomiting. No fevers. Has been constipated since Monday, is going to take some Miralax tonight. Patient has not been eating that well - yesterday 2 pop tarts x 2 , peanut butter crackers, and cheese/crackers.   Past Medical History Reviewed problem list.  Medications- reviewed and updated No additions to family history Social history- patient is a non- smoker  Objective: BP 98/78   Pulse 89   Temp 98 F (36.7 C) (Oral)   LMP 10/22/2016   SpO2 99%  Gen: NAD, alert, cooperative with exam Cardio: regular rate and rhythm, S1S2 heard, no murmurs appreciated Pulm: clear to auscultation bilaterally, no wheezes, rhonchi or rales GI: soft, non-tender, non-distended, bowel sounds present, no RUQ tenders, no murphy sign, notes some mild left upper quadrant tenderness  Skin: dry, intact, no rashes or lesions   Assessment/Plan: See problem based a/p  Calculus of gallbladder without cholecystitis without obstruction Evaluated and diagnosed with cholelithiasis, given patient was found to have significant thrombocytopenia-surgery recommended follow-up as an outpatient for surgical removal of gallbladder.  Patient still notes having some nausea associated with eating however denies any right upper  quadrant pain, notes some very mild left upper quadrant pain but feels like this is due to constipation she has not had a bowel movement in 5 days - Will place an urgent referral for gallbladder removal - Miralax for constipation  - Return precautions provided   Thrombocytopenia [facility]) Oncology thinks that this is likely due to chemo for Hurthle Cell Carcinoma  - Follow up with Oncology - patient to make an appointment today  - Continue holding Xarelto and Lenvima  - Continue Fondaparinux  - Obtain CBC

## 2017-08-21 NOTE — Patient Instructions (Addendum)
Please make sure you follow up with [facility] and Oncology as soon as possibly. I have placed a referral for [facility] to discuss getting a gallbladder out. Will give you a call with your platelet results.

## 2017-08-21 NOTE — Assessment & Plan Note (Signed)
Oncology thinks that this is likely due to chemo for Hurthle Cell Carcinoma  - Follow up with Oncology - patient to make an appointment today  - Continue holding Xarelto and Lenvima  - Continue Fondaparinux  - Obtain CBC

## 2017-08-21 NOTE — Telephone Encounter (Signed)
Pt recently discharged from hospital. Called requesting f/u with [name]. Called pt back and appt created on Monday (6/3) for lab at 1300 and doctor visit at 1320. Pt performed readback.

## 2017-08-22 LAB — COMPREHENSIVE METABOLIC PANEL
A/G RATIO: 1.3 (ref 1.2–2.2)
ALT: 158 IU/L — AB (ref 0–32)
AST: 124 IU/L — AB (ref 0–40)
Albumin: 3 g/dL — ABNORMAL LOW (ref 3.5–5.5)
Alkaline Phosphatase: 429 IU/L — ABNORMAL HIGH (ref 39–117)
BUN/Creatinine Ratio: 8 — ABNORMAL LOW (ref 9–23)
BUN: 7 mg/dL (ref 6–24)
Bilirubin Total: 1.6 mg/dL — ABNORMAL HIGH (ref 0.0–1.2)
CALCIUM: 8.3 mg/dL — AB (ref 8.7–10.2)
CHLORIDE: 104 mmol/L (ref 96–106)
CO2: 28 mmol/L (ref 20–29)
Creatinine, Ser: 0.93 mg/dL (ref 0.57–1.00)
GFR calc Af Amer: 82 mL/min/{1.73_m2} (ref >59)
GFR, EST NON AFRICAN AMERICAN: 71 mL/min/{1.73_m2} (ref >59)
GLUCOSE: 106 mg/dL — AB (ref 65–99)
Globulin, Total: 2.4 g/dL (ref 1.5–4.5)
POTASSIUM: 4.4 mmol/L (ref 3.5–5.2)
Sodium: 145 mmol/L — ABNORMAL HIGH (ref 134–144)
Total Protein: 5.4 g/dL — ABNORMAL LOW (ref 6.0–8.5)

## 2017-08-22 LAB — CBC
Hematocrit: 33 % — ABNORMAL LOW (ref 34.0–46.6)
Hemoglobin: 10.7 g/dL — ABNORMAL LOW (ref 11.1–15.9)
MCH: 29.2 pg (ref 26.6–33.0)
MCHC: 32.4 g/dL (ref 31.5–35.7)
MCV: 90 fL (ref 79–97)
PLATELETS: 328 10*3/uL (ref 150–450)
RBC: 3.66 x10E6/uL — ABNORMAL LOW (ref 3.77–5.28)
RDW: 16.3 % — ABNORMAL HIGH (ref 12.3–15.4)
WBC: 4.5 10*3/uL (ref 3.4–10.8)

## 2017-08-24 ENCOUNTER — Telehealth: Payer: Self-pay | Admitting: Hematology

## 2017-08-24 ENCOUNTER — Ambulatory Visit: Payer: Medicare Other | Admitting: Physical Therapy

## 2017-08-24 ENCOUNTER — Telehealth: Payer: Self-pay | Admitting: Internal Medicine

## 2017-08-24 ENCOUNTER — Inpatient Hospital Stay: Payer: Medicare Other

## 2017-08-24 ENCOUNTER — Other Ambulatory Visit: Payer: Self-pay

## 2017-08-24 ENCOUNTER — Inpatient Hospital Stay: Payer: Medicare Other | Attending: Hematology | Admitting: Hematology

## 2017-08-24 ENCOUNTER — Telehealth: Payer: Self-pay | Admitting: *Deleted

## 2017-08-24 ENCOUNTER — Ambulatory Visit: Payer: Medicare Other | Admitting: Podiatry

## 2017-08-24 VITALS — BP 118/90 | HR 93 | Temp 98.6°F | Resp 19 | Ht 71.0 in

## 2017-08-24 DIAGNOSIS — R945 Abnormal results of liver function studies: Secondary | ICD-10-CM | POA: Insufficient documentation

## 2017-08-24 DIAGNOSIS — C73 Malignant neoplasm of thyroid gland: Secondary | ICD-10-CM

## 2017-08-24 DIAGNOSIS — C778 Secondary and unspecified malignant neoplasm of lymph nodes of multiple regions: Secondary | ICD-10-CM | POA: Insufficient documentation

## 2017-08-24 DIAGNOSIS — I10 Essential (primary) hypertension: Secondary | ICD-10-CM | POA: Insufficient documentation

## 2017-08-24 DIAGNOSIS — Z86711 Personal history of pulmonary embolism: Secondary | ICD-10-CM | POA: Insufficient documentation

## 2017-08-24 DIAGNOSIS — R29898 Other symptoms and signs involving the musculoskeletal system: Secondary | ICD-10-CM

## 2017-08-24 DIAGNOSIS — C7951 Secondary malignant neoplasm of bone: Secondary | ICD-10-CM | POA: Diagnosis not present

## 2017-08-24 DIAGNOSIS — C7800 Secondary malignant neoplasm of unspecified lung: Secondary | ICD-10-CM | POA: Insufficient documentation

## 2017-08-24 DIAGNOSIS — Z7901 Long term (current) use of anticoagulants: Secondary | ICD-10-CM | POA: Diagnosis not present

## 2017-08-24 LAB — LACTATE DEHYDROGENASE: LDH: 198 U/L (ref 125–245)

## 2017-08-24 LAB — CMP (CANCER CENTER ONLY)
ALBUMIN: 2.9 g/dL — AB (ref 3.5–5.0)
ALK PHOS: 328 U/L — AB (ref 40–150)
ALT: 64 U/L — ABNORMAL HIGH (ref 0–55)
ANION GAP: 6 (ref 3–11)
AST: 39 U/L — AB (ref 5–34)
BUN: 11 mg/dL (ref 7–26)
CALCIUM: 8.4 mg/dL (ref 8.4–10.4)
CO2: 30 mmol/L — AB (ref 22–29)
Chloride: 107 mmol/L (ref 98–109)
Creatinine: 1 mg/dL (ref 0.60–1.10)
GFR, Est AFR Am: >60 mL/min (ref >60)
GFR, Estimated: >60 mL/min (ref >60)
GLUCOSE: 112 mg/dL (ref 70–140)
Potassium: 4.1 mmol/L (ref 3.5–5.1)
SODIUM: 143 mmol/L (ref 136–145)
Total Bilirubin: 0.6 mg/dL (ref 0.2–1.2)
Total Protein: 6.1 g/dL — ABNORMAL LOW (ref 6.4–8.3)

## 2017-08-24 LAB — CBC WITH DIFFERENTIAL (CANCER CENTER ONLY)
Basophils Absolute: 0 10*3/uL (ref 0.0–0.1)
Basophils Relative: 1 %
Eosinophils Absolute: 0.1 10*3/uL (ref 0.0–0.5)
Eosinophils Relative: 1 %
HEMATOCRIT: 33.2 % — AB (ref 34.8–46.6)
Hemoglobin: 10.8 g/dL — ABNORMAL LOW (ref 11.6–15.9)
LYMPHS PCT: 18 %
Lymphs Abs: 1.1 10*3/uL (ref 0.9–3.3)
MCH: 30.1 pg (ref 25.1–34.0)
MCHC: 32.5 g/dL (ref 31.5–36.0)
MCV: 92.8 fL (ref 79.5–101.0)
MONO ABS: 0.3 10*3/uL (ref 0.1–0.9)
MONOS PCT: 5 %
NEUTROS ABS: 4.7 10*3/uL (ref 1.5–6.5)
Neutrophils Relative %: 75 %
Platelet Count: 320 10*3/uL (ref 145–400)
RBC: 3.57 MIL/uL — ABNORMAL LOW (ref 3.70–5.45)
RDW: 17.3 % — ABNORMAL HIGH (ref 11.2–14.5)
WBC Count: 6.2 10*3/uL (ref 3.9–10.3)

## 2017-08-24 MED ORDER — RIVAROXABAN 20 MG PO TABS: 20.0000 mg | ORAL_TABLET | Freq: Every day | ORAL | 3 refills | Status: DC

## 2017-08-24 NOTE — Telephone Encounter (Signed)
Appointments scheduled AVS/Calendar printed per 6/3 los

## 2017-08-24 NOTE — Progress Notes (Signed)
Sarah [name] Kitchen    HEMATOLOGY/ONCOLOGY CLINIC NOTE  Date of Service: 08/24/17   Patient Care Team: Sarah Skiff, MD as PCP - General (Family Medicine) Sarah Staggers, MD as Consulting Physician (Physical Medicine and Rehabilitation) Sarah Pall, MD as Consulting Physician (Neurosurgery) Sarah Genera, MD as Consulting Physician (Hematology) Sarah Pi, MD as Consulting Physician (Endocrinology)   Sarah Bailiff MD (Sp[facility] [place]) [phone]. 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  01/09/2014- patient underwent total thyroidectomy by Dr. Elie [name]. Pathology showed Hurthle cell carcinoma with no perineural invasion, presence of lymphovascular invasion and extra thyroidal extension. p T3, p N0, Mx  Received 30 grays in 10 fractions to her C7-T6 lesion finished in November 2015  Received 135 mCi of iodine 131 on 07/21/2014. Whole-body scan was apparently negative.  07/05/2015-07/10/2015 admitted to [facility] for leg weakness. MRI of the T-spine showed recurrent tumor causing spinal cord compression. Patient was emergently taken to the or and underwent a redo thoracic laminectomy of T3 and partial tumor resection.   08/07/2015 PET/CT scan of the chest abdomen pelvis showed slight enlargement of FDG avid left level III and level IV lymph nodes concerning for worsening nodal metastases. Slight enlargement of the hypermetabolic right superior mediastinal mass and multiple metabolically active pulmonary nodules representing worsening metastases. T3 vertebral body and bilateral pedicle metastases status  post posture metallic fusion. No residual uptake in the thyroidectomy bed  Thyroglobulin level was 11.7 (thyroglobulin antibody present 9.8)   HISTORY OF PRESENTING ILLNESS:   Sarah [name] is a wonderful 52 y.o. female who has been referred to [place] by Dr Sarah [name] (medical oncology at [facility]) for help with local comanagement of the patient's metastatic Hurthle cell thyroid carcinoma with details as noted above. Patient had recent hospitalization from 4/13/7 to 07/10/2015 at [facility] for lower extremity weakness and was noted to have cord compression from recurrent tumor at T3. She is status post redo thoracic laminectomy of T3 and partial tumor resection to try to decompress the core. Pathology was consistent with metastatic Hurthle cell carcinoma.  She has been on dexamethasone 2 mg by mouth twice a day. Continue to have lower extremity weakness and is currently in a skilled nursing facility getting rehabilitation. She feels her lower extremity weakness is possibly getting somewhat better.  Patient had a repeat PET/CT scan at [facility] on 08/07/2015 as noted above which shows progression of her metastatic disease including nodal metastases , pulmonary nodules and right superior mediastinal mass.  MRI of the spine on 08/14/2015 showed improved but persistent cord compression at T3 level.  She was started by Dr. Leamon [name] on Lenvatinib and 14 mg by mouth daily for progressive metastatic disease after ruling out the option of repeat surgery with Dr. Cyndy [name] and reradiation with Dr. Ledon [name].  After a week or so the patient was increased to a dose of 10m po daily. She notes no diarrhea or other gastrointestinal symptoms. No skin rashes.  Is noted to have oral thrush and was started on nystatin swish and swallow.   Patient has a history of pulmonary  embolism 2015 and has continued to be on Xarelto for anticoagulation.   INTERVAL HISTORY  Sarah [name] returns today for a  follow up regarding her Metastatic Hurthle cell thyroid carcinoma. The patient's last visit with [place] was on 07/30/17. She is accompanied today by her sister. The pt reports that she is doing well overall.   The pt reports that she went to the hospital recently for nausea and vomiting related to gallstones. She will be planning a surgery for their removal. She notes that after she was discharged she lost significant control of her legs, noting that she can hardly get into a car. She notes that she can feel her legs but can't move them well at all. She notes that her ankles and legs have been swollen since she left the hospital.   She has been off of [place] for about 10 days.   Lab results today (08/24/17) of CBC, CMP, and Reticulocytes is as follows: all values are WNL except for RBC at 3.57, HGB at 10.8, HCT at 33.2, RDW at 17.3, CO2 at 30, Total Protein at 6.1, Albumin at 2.9, AST at 39, ALT at 64, Alk Phos at 328. LDH 08/24/17 is WNL at 198.   On review of systems, pt reports eating well, ankle swelling, significant leg weakness and denies back pain, changes in sensation, and any other symptoms.    MEDICAL HISTORY:  Past Medical History:  Diagnosis Date  . Cancer [facility])    FNA of thyroid positive for onconytic/hurthle cell carcinoma  . Chronic back pain   . DDD (degenerative disc disease), cervical   . DJD (degenerative joint disease)   . History of rectal fissure   . HIT (heparin-induced thrombocytopenia) ([place])   . Hypertension   . Obesity   . Paraplegia at T4 level ([place])   . Pulmonary embolus, right ([place]) 2015  . Rotator cuff tendonitis right    SURGICAL HISTORY: Past Surgical History:  Procedure Laterality Date  . LAMINECTOMY N/A 12/14/2013   Procedure: THORACIC LAMINECTOMY FOR TUMOR THORACIC THREE;  Surgeon: Sarah Pall, MD;  Location: [place] NEURO ORS;  Service: Neurosurgery;  Laterality: N/A;  THORACIC LAMINECTOMY FOR TUMOR THORACIC THREE  . LAMINECTOMY N/A 07/05/2015   Procedure:  THORACIC LAMINECTOMY FOR TUMOR;  Surgeon: Sarah Pall, MD;  Location: [place] NEURO ORS;  Service: Neurosurgery;  Laterality: N/A;  . POSTERIOR LUMBAR FUSION 4 LEVEL N/A 12/30/2013   Procedure: Thoracic one-Thoracic five posterior thoracic fusion with pedicle screws;  Surgeon: Sarah Pall, MD;  Location: [place] NEURO ORS;  Service: Neurosurgery;  Laterality: N/A;  Thoracic one-Thoracic five posterior thoracic fusion with pedicle screws  . THYROIDECTOMY N/A 01/09/2014   Procedure: TOTAL THYROIDECTOMY;  Surgeon: Izora Gala, MD;  Location: [facility];  Service: ENT;  Laterality: N/A;  . TONSILLECTOMY      SOCIAL HISTORY: Social History   Socioeconomic History  . Marital status: Single    Spouse name: Not on file  . Number of children: Not on file  . Years of education: Not on file  . Highest education level: Not on file  Occupational History  . Not on file  Social Needs  . Financial resource strain: Not on file  . Food insecurity:    Worry: Not on file    Inability: Not on file  . Transportation needs:    Medical: Not on file    Non-medical: Not on file  Tobacco Use  . Smoking status: Never Smoker  . Smokeless tobacco: Never Used  Substance and Sexual Activity  . Alcohol use: No  . Drug use: No  . Sexual activity: Not Currently  Lifestyle  . Physical activity:    Days per week: Not on file    Minutes per session: Not on file  . Stress: Not on file  Relationships  . Social connections:    Talks on phone: Not on file    Gets together: Not on file    Attends religious service: Not on file    Active member of club or organization: Not on file    Attends meetings of clubs or organizations: Not on file    Relationship status: Not on file  . Intimate partner violence:    Fear of current or ex partner: Not on file    Emotionally abused: Not on file    Physically abused: Not on file    Forced sexual activity: Not on file  Other Topics Concern  . Not on file  Social History Narrative  .  Not on file    FAMILY HISTORY: Family History  Problem Relation Age of Onset  . Hypertension Mother   . Diabetes Mother   . Hypertension Father   . Stroke Father   . Diabetes Father   . Diabetes Sister   . Diabetes Sister     ALLERGIES:  is allergic to bee venom; keflex [cephalexin]; tramadol; gabapentin; heparin; hydrochlorothiazide; hydrocodone; and oxycodone-acetaminophen.  MEDICATIONS:  Current Outpatient Medications  Medication Sig Dispense Refill  . baclofen (LIORESAL) 20 MG tablet TAKE (1) TABLET THREE TIMES DAILY. 90 tablet 0  . colchicine 0.6 MG tablet TAKE  (1)  TABLET TWICE A DAY AS NEEDED. 30 tablet 3  . fluticasone (FLONASE) 50 MCG/ACT nasal spray Place 2 sprays into both nostrils daily. 16 g 6  . fondaparinux (ARIXTRA) 2.5 MG/0.5ML SOLN injection Inject 0.5 mLs (2.5 mg total) into the skin daily at 6 (six) AM. 2.5 mL 3  . furosemide (LASIX) 20 MG tablet Take 1 tablet (20 mg total) by mouth daily as needed. (Patient taking differently: Take 20 mg by mouth daily as needed for fluid. ) 30 tablet 1  . levothyroxine (SYNTHROID, LEVOTHROID) 137 MCG tablet Take 137 mcg by mouth daily before breakfast.    . lisinopril (PRINIVIL,ZESTRIL) 20 MG tablet Take 2 tablets (40 mg total) by mouth daily. 60 tablet 1  . NIFEdipine (PROCARDIA XL) 30 MG 24 hr tablet Take 1 tablet (30 mg total) by mouth daily. 30 tablet 1  . ondansetron (ZOFRAN) 4 MG tablet Take 1 tablet (4 mg total) by mouth every 6 (six) hours. 30 tablet 5  . pantoprazole (PROTONIX) 40 MG tablet Take 1 tablet (40 mg total) by mouth daily. 30 tablet 2  . senna (SENOKOT) 8.6 MG TABS tablet Take 2 tablets (17.2 mg total) by mouth daily. 120 each 0  . sertraline (ZOLOFT) 25 MG tablet Take 1 tablet (25 mg total) by mouth daily. 30 tablet 3   No current facility-administered medications for this visit.     REVIEW OF SYSTEMS:    A 10+ POINT REVIEW OF SYSTEMS WAS OBTAINED including neurology, dermatology, psychiatry, cardiac,  respiratory, lymph, extremities, GI, GU, Musculoskeletal, constitutional, breasts, reproductive, HEENT.  All pertinent positives are noted in the HPI.  All others are negative.    PHYSICAL EXAMINATION:  ECOG PERFORMANCE STATUS: 3 - Symptomatic, >50% confined to bed  Vitals:   08/24/17 1314  BP: 118/90  Pulse: 93  Resp: 19  Temp: 98.6 F (37 C)  SpO2: 100%   Filed Weights   .Body mass index is 29.52 kg/m.   GENERAL:alert, in no acute distress and comfortable SKIN: no acute rashes, no significant lesions EYES: conjunctiva are pink and non-injected, sclera anicteric OROPHARYNX: MMM, no exudates, no oropharyngeal erythema or ulceration NECK: supple, no JVD LYMPH:  no palpable lymphadenopathy in the cervical, axillary or inguinal regions LUNGS: clear to auscultation b/l with normal respiratory effort HEART: regular rate & rhythm ABDOMEN:  normoactive bowel sounds , non tender, not distended. Extremity: trace pedal edema PSYCH: alert & oriented x 3 with fluent speech NEURO: no focal motor/sensory deficits    LABORATORY DATA:  I have reviewed the data as listed  . CBC Latest Ref Rng & Units 08/24/2017 08/21/2017 08/13/2017  WBC 3.9 - 10.3 K/uL 6.2 4.5 4.0  Hemoglobin 11.6 - 15.9 g/dL 10.8(L) 10.7(L) 10.5(L)  Hematocrit 34.8 - 46.6 % 33.2(L) 33.0(L) 32.6(L)  Platelets 145 - 400 K/uL 320 328 51(L)   ANC 2.3k . CMP Latest Ref Rng & Units 08/24/2017 08/21/2017 08/11/2017  Glucose 70 - 140 mg/dL 112 106(H) 111(H)  BUN 7 - 26 mg/dL '11 7 18  '$ Creatinine 0.60 - 1.10 mg/dL 1.00 0.93 0.89  Sodium 136 - 145 mmol/L 143 145(H) 137  Potassium 3.5 - 5.1 mmol/L 4.1 4.4 3.8  Chloride 98 - 109 mmol/L 107 104 107  CO2 22 - 29 mmol/L 30(H) 28 24  Calcium 8.4 - 10.4 mg/dL 8.4 8.3(L) 7.7(L)  Total Protein 6.4 - 8.3 g/dL 6.1(L) 5.4(L) 5.1(L)  Total Bilirubin 0.2 - 1.2 mg/dL 0.6 1.6(H) 1.6(H)  Alkaline Phos 40 - 150 U/L 328(H) 429(H) 97  AST 5 - 34 U/L 39(H) 124(H) 13(L)  ALT 0 - 55 U/L 64(H)  158(H) 10(L)         RADIOGRAPHIC STUDIES: I have personally reviewed the radiological images as listed and agreed with the findings in the report.  .Ct Abdomen Pelvis Wo Contrast  Result Date: 08/09/2017 CLINICAL DATA:  Per ED notes: Pt presents today for nausea x 2 days. Took zofran at home with no relief. Pt denies diarrhea. Pt take oral chemo for thyroid CA with mets. Pt denies being around anyone with similar symptoms. EXAM: CT ABDOMEN AND PELVIS WITHOUT CONTRAST TECHNIQUE: Multidetector CT imaging of the abdomen and pelvis was performed following the standard protocol without IV contrast. COMPARISON:  PET-CT 05/20/2017 and previous FINDINGS: Lower chest: Lower lobe pulmonary nodules up to 5 mm, stable since previous. No pleural or pericardial effusion. Hepatobiliary: Gallbladder is distended with small partially calcified stones in its dependent aspect. Unremarkable liver. Pancreas: Unremarkable. No pancreatic ductal dilatation or surrounding inflammatory changes. Spleen: Normal in size without focal abnormality. Adrenals/Urinary Tract: Adrenal glands are unremarkable. Kidneys are normal, without renal calculi, focal lesion, or hydronephrosis. Bladder is unremarkable. Stomach/Bowel: Stomach is nondistended. Small bowel is nondilated. Colon is nondilated with a few scattered diverticula. Vascular/Lymphatic: No significant vascular findings are present. No enlarged abdominal or pelvic lymph nodes. Reproductive: Uterus and bilateral adnexa are unremarkable. Other: No ascites.  No free air. Musculoskeletal: Spondylitic changes throughout the lumbar spine. Bilateral hip degenerative spurring. No fracture or worrisome bone lesion. IMPRESSION: 1. No acute findings. 2. Stable small pulmonary nodules at the visualized lung bases. Electronically Signed   By: Lucrezia Europe M.D.   On: 08/09/2017 13:54   US Abdomen Complete  Result Date: 08/10/2017 CLINICAL DATA:  Elevated bilirubin level. EXAM: ABDOMEN  ULTRASOUND COMPLETE COMPARISON:  CT scan of Aug 09, 2017. FINDINGS: Gallbladder: Multiple  gallstones are noted with the largest measuring 1.1 cm. No gallbladder wall thickening or pericholecystic fluid is noted. No sonographic Murphy's sign is noted. Common bile duct: Diameter: 2.8 mm which is within normal limits. Liver: No focal lesion identified. Within normal limits in parenchymal echogenicity. Portal vein is patent on color Doppler imaging with normal direction of blood flow towards the liver. IVC: No abnormality visualized. Pancreas: Not well visualized due to overlying bowel gas. Spleen: Size and appearance within normal limits. Right Kidney: Length: 11 cm. Echogenicity within normal limits. No mass or hydronephrosis visualized. Left Kidney: Length: 10.2 cm. Echogenicity within normal limits. No mass or hydronephrosis visualized. Abdominal aorta: No aneurysm visualized. Other findings: None. IMPRESSION: Cholelithiasis without inflammation. Pancreas not well visualized due to overlying bowel gas. No other abnormality seen in the abdomen. Electronically Signed   By: Marijo Conception, M.D.   On: 08/10/2017 14:32   Nm Hepato W/eject Fract  Result Date: 08/11/2017 CLINICAL DATA:  RIGHT upper quadrant pain for few days EXAM: NUCLEAR MEDICINE HEPATOBILIARY IMAGING WITH GALLBLADDER EF TECHNIQUE: Sequential images of the abdomen were obtained out to 60 minutes following intravenous administration of radiopharmaceutical. After slow intravenous infusion of 1.91 micrograms Cholecystokinin, gallbladder ejection fraction was determined. RADIOPHARMACEUTICALS:  5.5 mCi Tc-55mCholetec IV COMPARISON:  Ultrasound abdomen 08/10/2017 FINDINGS: Normal tracer extraction from bloodstream indicating normal hepatocellular function. Normal excretion of tracer into biliary tree. Gallbladder visualized at 25 min, though no additional tracer is seen within the gallbladder until 58 minutes. Minimal tracer within gallbladder prior to  CCK administration. Small bowel visualized at 12 min. No hepatic retention of tracer. Subjectively very little to no emptying of tracer from gallbladder following CCK administration. Calculated gallbladder ejection fraction is likely inaccurate due to superimposed small bowel loops with the gallbladder near the conclusion of the post CCK images. Patient reported no symptoms following CCK administration. Normal gallbladder ejection fraction following CCK stimulation is greater than 40% at 1 hour. IMPRESSION: Patent biliary tree. Suboptimal filling of gallbladder prior to CCK with limited assessment of gallbladder emptying post CCK due to superimposed bowel and poor filling, unable to generate an accurate gallbladder ejection fraction but subjectively there is very poor/little emptying following CCK stimulation. Electronically Signed   By: MLavonia DanaM.D.   On: 08/11/2017 13:13     ASSESSMENT & PLAN:  52year old female with   1) Stage IVC metastatic Hurthle cell thyroid carcinoma with metastases to the spine, lung, superior mediastinal mass and nodal metastases. Improved based on last PET/CT  Patient has had treatment as noted above. h/o cord compression at T3 level due to recurrent tumor status post partial resection and debulking.   MRI on 08/14/2015 showed improved but persistent cord compression at the T3 level. She has been noted not to be a candidate for further repeat surgery at this time or repeat radiation. [facility] showed evidence of tumor progression and patient was started on lenvatinib as per Dr. MLeamon [name] Her thyroglobulin level was 11.7 with anti[place] 9.[place] 11/[place]/2015: shows relatively stable though high burden of disease. Some concern of local recurrence in the thyroidectomy bed which is asymptomatic at this time. MRI thoracic spine 12/25/2015: No new abnormality since the prior examination. Status post  T1-5 fusion for T3 decompression. Mass effect on the cord at the T3 level appears improved compared to the prior exam. Postoperative fluid collection likely representing a seroma seen on  the prior study has slightly decreased in size.  PET/CT 06/03/2016 with Generally reduced hypermetabolic activity in the previously identified lesions, including the pulmonary nodules, dominant upper thoracic metastatic focus, and left lower cervical adenopathy. Mildly reduced size of previous lesions as well. No new lesions.  PET/CT 12/12/2016: Concern for progression of metastatic Hurthle cell thyroid carcinoma with significant increase in metabolic activity of several lymph nodes, pulmonary nodules, and skeletal metastasis. Most convincing element of progression is a significant increase in metabolic activity (more than doubling). Mild increase in size of the lymph nodes.  PET/CT  05/20/31: Partial metabolic response. Left supraclavicular and right paratracheal nodal metastases, bilateral pulmonary metastases, and osseous metastases involving the upper thoracic spine and right sternum, with mild decrease in metabolism, as described above.   Plan: -Discussed pt labwork today, 08/24/17; Alk Phos at 328, elevated AST and ALT likely from gall stones -Pt can continue with her Xarelto until 3 days before surgey -Will hold Lenvima at this time pending surgery, until about 2 weeks post surgery (given upcoming surgery and abnormal LFTs) -Given new leg symptoms will order a back MRI and see pt back in one week with MRI results  -PET/CT as ordered  2) grade 1 oral mucositis related to Lenvatinib -resolved  3) FDG avidity in the rectum - much improved. No significant concerns on last PET  4) abnormal LFTs likely from gallstones  5) Mild Leukopenia without neutropenia resolved. Plan  - patient has no clinical evidence of Hurthle cell carcinoma progression at this time -MRI T spine given LE weakness -on levothyroxine  per endocrinologist. Recommended she discussed this with Dr Chalmers Cater to determine need for levothyroxine dose adjustment for TSH suppression. Thyroglobulin levels stable and Ab levels -pending. -Continue Lenvatinib to '24mg'$  po daily . No prohibitive toxicities. BP better controlled today  6) history of right-sided pulmonary embolism on chronic anticoagulation with Rivaroxaban. Monitor for bleeding. No bleeding reported -will hold Xarelto 2 doses prior to date of surgery -will need peri-operative prophylaxis with lovenox and transition back to lovenox in 2-3 days post-op once hemostasis confirmed.  7)  HTN.-previously uncontrolled. Better controlled today No CP/SOB Plan -continue f/u with PCP and I informed her to begin taking her blood pressures in the mornings for a more accurate reading.    MRI thoracic and Lumbar spine in 5-7 days PET/CT RTC with Dr Irene Limbo in 10 days    All of the patients questions were answered with apparent satisfaction. The patient knows to call the clinic with any problems, questions or concerns.  . The total time spent in the appointment was 27 minutes and more than 50% was on counseling and direct patient care    Sullivan Lone MD [place] AAHIVMS [facility] Hematology/Oncology Physician [facility]  (Office):       [phone] (Work cell):  [phone] (Fax):           [phone]  I, Baldwin [place], am acting as a scribe for Dr Irene Limbo.   .I have reviewed the above documentation for accuracy and completeness, and I agree with the above.  Sarah Genera MD MS

## 2017-08-24 NOTE — Telephone Encounter (Signed)
Call patient to let her know that her results show that she likely has continued cholelithiasis and needs to be seen urgently by surgery to get her gallbladder removed.  Her platelets are now back up to normal which was the original reason for her not having her gallbladder out when she was diagnosed in the hospital

## 2017-08-24 NOTE — Telephone Encounter (Signed)
Patient's appt to see surgeon, for her gallbladder is not until June 12th, should she stay off of the lenvima until then? Also should she stop her blood pressure medication while not taking the lenvima?

## 2017-08-26 ENCOUNTER — Telehealth: Payer: Self-pay

## 2017-08-26 DIAGNOSIS — C7951 Secondary malignant neoplasm of bone: Secondary | ICD-10-CM | POA: Diagnosis not present

## 2017-08-26 DIAGNOSIS — D696 Thrombocytopenia, unspecified: Secondary | ICD-10-CM | POA: Diagnosis not present

## 2017-08-26 DIAGNOSIS — E119 Type 2 diabetes mellitus without complications: Secondary | ICD-10-CM | POA: Diagnosis not present

## 2017-08-26 DIAGNOSIS — C73 Malignant neoplasm of thyroid gland: Secondary | ICD-10-CM | POA: Diagnosis not present

## 2017-08-26 DIAGNOSIS — Z86711 Personal history of pulmonary embolism: Secondary | ICD-10-CM | POA: Diagnosis not present

## 2017-08-26 DIAGNOSIS — T451X5D Adverse effect of antineoplastic and immunosuppressive drugs, subsequent encounter: Secondary | ICD-10-CM | POA: Diagnosis not present

## 2017-08-26 DIAGNOSIS — K828 Other specified diseases of gallbladder: Secondary | ICD-10-CM | POA: Diagnosis not present

## 2017-08-26 DIAGNOSIS — I10 Essential (primary) hypertension: Secondary | ICD-10-CM | POA: Diagnosis not present

## 2017-08-26 DIAGNOSIS — E039 Hypothyroidism, unspecified: Secondary | ICD-10-CM | POA: Diagnosis not present

## 2017-08-26 DIAGNOSIS — K8050 Calculus of bile duct without cholangitis or cholecystitis without obstruction: Secondary | ICD-10-CM | POA: Diagnosis not present

## 2017-08-26 NOTE — Telephone Encounter (Signed)
Pt requesting clarification of medication discontinuation in r/t gallbladder consultation on 09/02/17. Pt can hold lenvima at this time. Should be off of lenvima at least two weeks before and after gallbladder surgery. Pt concern about hypotension. No dizziness or light-headedness noted. Per [name], okay to continue BP medication at this time. If symptoms occur, pt to discuss with PCP for further direction. Can stop nifedipine with symptoms and restart with lenvima continuation. Xarelto to be held two days prior to surgery. Nifedipine to be held morning of surgery. Pt verbalized understanding of directions.

## 2017-08-26 NOTE — Telephone Encounter (Signed)
Fax sent to [facility] with signed orders for pt. Confirmed fax receipt 08/26/17 at 1647.

## 2017-08-27 ENCOUNTER — Telehealth: Payer: Self-pay | Admitting: *Deleted

## 2017-08-27 NOTE — Telephone Encounter (Signed)
"  I need to know why MRI was ordered.  I thought PET scan was all [name] needed.  Yes I am having problems with my legs but he asked if I was ready to have PET scan.  PET scheduled for June 11 th.  I need more information so please transfer to Dr. Grier Mitts nurse."    Before request to transfer call, this nurse provided PET helps determine treatment strategies from showing areas of body metastasis.  MRI will show front, back, side and flat slices of spine to see details of nerves and spinal cord to decide or rule out cause of weakness to lower body.

## 2017-08-29 ENCOUNTER — Other Ambulatory Visit: Payer: Self-pay | Admitting: Physical Medicine & Rehabilitation

## 2017-09-01 ENCOUNTER — Ambulatory Visit: Payer: Medicare Other | Attending: Physical Medicine & Rehabilitation | Admitting: Physical Therapy

## 2017-09-01 ENCOUNTER — Ambulatory Visit (HOSPITAL_COMMUNITY): Admission: RE | Admit: 2017-09-01 | Payer: Medicare Other | Source: Ambulatory Visit

## 2017-09-01 ENCOUNTER — Ambulatory Visit (HOSPITAL_COMMUNITY): Payer: Medicare Other

## 2017-09-02 ENCOUNTER — Encounter (HOSPITAL_COMMUNITY): Payer: Self-pay | Admitting: Emergency Medicine

## 2017-09-02 ENCOUNTER — Emergency Department (HOSPITAL_COMMUNITY): Payer: Medicare Other

## 2017-09-02 ENCOUNTER — Telehealth: Payer: Self-pay

## 2017-09-02 ENCOUNTER — Inpatient Hospital Stay: Payer: Medicare Other | Admitting: Hematology

## 2017-09-02 ENCOUNTER — Encounter: Payer: Self-pay | Admitting: General Practice

## 2017-09-02 ENCOUNTER — Inpatient Hospital Stay (HOSPITAL_COMMUNITY)
Admission: EM | Admit: 2017-09-02 | Discharge: 2017-09-08 | DRG: 519 | Disposition: A | Discharge status: Rehabilitation Facility | Payer: Medicare Other | Attending: Neurosurgery | Admitting: Neurosurgery

## 2017-09-02 DIAGNOSIS — S72332A Displaced oblique fracture of shaft of left femur, initial encounter for closed fracture: Secondary | ICD-10-CM | POA: Diagnosis not present

## 2017-09-02 DIAGNOSIS — M542 Cervicalgia: Secondary | ICD-10-CM | POA: Diagnosis not present

## 2017-09-02 DIAGNOSIS — Z91030 Bee allergy status: Secondary | ICD-10-CM | POA: Diagnosis not present

## 2017-09-02 DIAGNOSIS — C412 Malignant neoplasm of vertebral column: Secondary | ICD-10-CM | POA: Diagnosis not present

## 2017-09-02 DIAGNOSIS — R531 Weakness: Secondary | ICD-10-CM | POA: Diagnosis not present

## 2017-09-02 DIAGNOSIS — C779 Secondary and unspecified malignant neoplasm of lymph node, unspecified: Secondary | ICD-10-CM | POA: Diagnosis not present

## 2017-09-02 DIAGNOSIS — Z7401 Bed confinement status: Secondary | ICD-10-CM | POA: Diagnosis not present

## 2017-09-02 DIAGNOSIS — Z833 Family history of diabetes mellitus: Secondary | ICD-10-CM

## 2017-09-02 DIAGNOSIS — K592 Neurogenic bowel, not elsewhere classified: Secondary | ICD-10-CM | POA: Diagnosis present

## 2017-09-02 DIAGNOSIS — K5900 Constipation, unspecified: Secondary | ICD-10-CM | POA: Diagnosis not present

## 2017-09-02 DIAGNOSIS — Z881 Allergy status to other antibiotic agents status: Secondary | ICD-10-CM | POA: Diagnosis not present

## 2017-09-02 DIAGNOSIS — R29898 Other symptoms and signs involving the musculoskeletal system: Secondary | ICD-10-CM

## 2017-09-02 DIAGNOSIS — C7802 Secondary malignant neoplasm of left lung: Secondary | ICD-10-CM | POA: Diagnosis not present

## 2017-09-02 DIAGNOSIS — M25562 Pain in left knee: Secondary | ICD-10-CM | POA: Diagnosis not present

## 2017-09-02 DIAGNOSIS — E890 Postprocedural hypothyroidism: Secondary | ICD-10-CM | POA: Diagnosis not present

## 2017-09-02 DIAGNOSIS — W1830XA Fall on same level, unspecified, initial encounter: Secondary | ICD-10-CM | POA: Diagnosis not present

## 2017-09-02 DIAGNOSIS — Z79890 Hormone replacement therapy (postmenopausal): Secondary | ICD-10-CM | POA: Diagnosis not present

## 2017-09-02 DIAGNOSIS — Z79899 Other long term (current) drug therapy: Secondary | ICD-10-CM

## 2017-09-02 DIAGNOSIS — G9520 Unspecified cord compression: Secondary | ICD-10-CM | POA: Diagnosis not present

## 2017-09-02 DIAGNOSIS — G8220 Paraplegia, unspecified: Secondary | ICD-10-CM | POA: Diagnosis not present

## 2017-09-02 DIAGNOSIS — C7800 Secondary malignant neoplasm of unspecified lung: Secondary | ICD-10-CM | POA: Diagnosis not present

## 2017-09-02 DIAGNOSIS — Z888 Allergy status to other drugs, medicaments and biological substances status: Secondary | ICD-10-CM | POA: Diagnosis not present

## 2017-09-02 DIAGNOSIS — G9529 Other cord compression: Secondary | ICD-10-CM

## 2017-09-02 DIAGNOSIS — Z85830 Personal history of malignant neoplasm of bone: Secondary | ICD-10-CM | POA: Diagnosis not present

## 2017-09-02 DIAGNOSIS — D899 Disorder involving the immune mechanism, unspecified: Secondary | ICD-10-CM | POA: Diagnosis present

## 2017-09-02 DIAGNOSIS — B9620 Unspecified Escherichia coli [E. coli] as the cause of diseases classified elsewhere: Secondary | ICD-10-CM | POA: Diagnosis present

## 2017-09-02 DIAGNOSIS — N390 Urinary tract infection, site not specified: Secondary | ICD-10-CM

## 2017-09-02 DIAGNOSIS — S0990XA Unspecified injury of head, initial encounter: Secondary | ICD-10-CM | POA: Diagnosis not present

## 2017-09-02 DIAGNOSIS — C7989 Secondary malignant neoplasm of other specified sites: Secondary | ICD-10-CM | POA: Diagnosis present

## 2017-09-02 DIAGNOSIS — D4989 Neoplasm of unspecified behavior of other specified sites: Secondary | ICD-10-CM | POA: Diagnosis not present

## 2017-09-02 DIAGNOSIS — N319 Neuromuscular dysfunction of bladder, unspecified: Secondary | ICD-10-CM | POA: Diagnosis present

## 2017-09-02 DIAGNOSIS — C73 Malignant neoplasm of thyroid gland: Secondary | ICD-10-CM | POA: Diagnosis present

## 2017-09-02 DIAGNOSIS — E669 Obesity, unspecified: Secondary | ICD-10-CM | POA: Diagnosis present

## 2017-09-02 DIAGNOSIS — Z7951 Long term (current) use of inhaled steroids: Secondary | ICD-10-CM | POA: Diagnosis not present

## 2017-09-02 DIAGNOSIS — R945 Abnormal results of liver function studies: Secondary | ICD-10-CM | POA: Diagnosis not present

## 2017-09-02 DIAGNOSIS — Z86718 Personal history of other venous thrombosis and embolism: Secondary | ICD-10-CM | POA: Diagnosis not present

## 2017-09-02 DIAGNOSIS — M549 Dorsalgia, unspecified: Secondary | ICD-10-CM | POA: Diagnosis not present

## 2017-09-02 DIAGNOSIS — G992 Myelopathy in diseases classified elsewhere: Secondary | ICD-10-CM | POA: Diagnosis not present

## 2017-09-02 DIAGNOSIS — S4991XA Unspecified injury of right shoulder and upper arm, initial encounter: Secondary | ICD-10-CM | POA: Diagnosis not present

## 2017-09-02 DIAGNOSIS — D492 Neoplasm of unspecified behavior of bone, soft tissue, and skin: Secondary | ICD-10-CM | POA: Diagnosis present

## 2017-09-02 DIAGNOSIS — D649 Anemia, unspecified: Secondary | ICD-10-CM | POA: Diagnosis not present

## 2017-09-02 DIAGNOSIS — Z923 Personal history of irradiation: Secondary | ICD-10-CM

## 2017-09-02 DIAGNOSIS — Z86711 Personal history of pulmonary embolism: Secondary | ICD-10-CM | POA: Diagnosis not present

## 2017-09-02 DIAGNOSIS — Z7901 Long term (current) use of anticoagulants: Secondary | ICD-10-CM

## 2017-09-02 DIAGNOSIS — R609 Edema, unspecified: Secondary | ICD-10-CM | POA: Diagnosis not present

## 2017-09-02 DIAGNOSIS — K219 Gastro-esophageal reflux disease without esophagitis: Secondary | ICD-10-CM | POA: Diagnosis not present

## 2017-09-02 DIAGNOSIS — M7989 Other specified soft tissue disorders: Secondary | ICD-10-CM | POA: Diagnosis not present

## 2017-09-02 DIAGNOSIS — S72402S Unspecified fracture of lower end of left femur, sequela: Secondary | ICD-10-CM | POA: Diagnosis not present

## 2017-09-02 DIAGNOSIS — D497 Neoplasm of unspecified behavior of endocrine glands and other parts of nervous system: Secondary | ICD-10-CM | POA: Diagnosis not present

## 2017-09-02 DIAGNOSIS — E119 Type 2 diabetes mellitus without complications: Secondary | ICD-10-CM | POA: Diagnosis present

## 2017-09-02 DIAGNOSIS — C7951 Secondary malignant neoplasm of bone: Secondary | ICD-10-CM | POA: Diagnosis not present

## 2017-09-02 DIAGNOSIS — Z85850 Personal history of malignant neoplasm of thyroid: Secondary | ICD-10-CM

## 2017-09-02 DIAGNOSIS — D62 Acute posthemorrhagic anemia: Secondary | ICD-10-CM | POA: Diagnosis not present

## 2017-09-02 DIAGNOSIS — I10 Essential (primary) hypertension: Secondary | ICD-10-CM | POA: Diagnosis not present

## 2017-09-02 DIAGNOSIS — Z8249 Family history of ischemic heart disease and other diseases of the circulatory system: Secondary | ICD-10-CM | POA: Diagnosis not present

## 2017-09-02 DIAGNOSIS — Z885 Allergy status to narcotic agent status: Secondary | ICD-10-CM | POA: Diagnosis not present

## 2017-09-02 DIAGNOSIS — W19XXXA Unspecified fall, initial encounter: Secondary | ICD-10-CM

## 2017-09-02 DIAGNOSIS — D6489 Other specified anemias: Secondary | ICD-10-CM | POA: Diagnosis present

## 2017-09-02 DIAGNOSIS — C7801 Secondary malignant neoplasm of right lung: Secondary | ICD-10-CM | POA: Diagnosis present

## 2017-09-02 DIAGNOSIS — Z6830 Body mass index (BMI) 30.0-30.9, adult: Secondary | ICD-10-CM

## 2017-09-02 DIAGNOSIS — C781 Secondary malignant neoplasm of mediastinum: Secondary | ICD-10-CM | POA: Diagnosis not present

## 2017-09-02 DIAGNOSIS — Z886 Allergy status to analgesic agent status: Secondary | ICD-10-CM | POA: Diagnosis not present

## 2017-09-02 DIAGNOSIS — Z9889 Other specified postprocedural states: Secondary | ICD-10-CM

## 2017-09-02 DIAGNOSIS — M25511 Pain in right shoulder: Secondary | ICD-10-CM

## 2017-09-02 DIAGNOSIS — Z981 Arthrodesis status: Secondary | ICD-10-CM | POA: Diagnosis not present

## 2017-09-02 DIAGNOSIS — S199XXA Unspecified injury of neck, initial encounter: Secondary | ICD-10-CM | POA: Diagnosis not present

## 2017-09-02 LAB — URINALYSIS, ROUTINE W REFLEX MICROSCOPIC
BILIRUBIN URINE: NEGATIVE
Glucose, UA: NEGATIVE mg/dL
KETONES UR: 5 mg/dL — AB
Nitrite: POSITIVE — AB
PH: 5 (ref 5.0–8.0)
PROTEIN: 30 mg/dL — AB
Specific Gravity, Urine: 1.013 (ref 1.005–1.030)

## 2017-09-02 LAB — CBC WITH DIFFERENTIAL/PLATELET
Basophils Absolute: 0 10*3/uL (ref 0.0–0.1)
Basophils Relative: 0 %
EOS ABS: 0 10*3/uL (ref 0.0–0.7)
Eosinophils Relative: 0 %
HCT: 34 % — ABNORMAL LOW (ref 36.0–46.0)
HEMOGLOBIN: 10.9 g/dL — AB (ref 12.0–15.0)
LYMPHS ABS: 0.7 10*3/uL (ref 0.7–4.0)
Lymphocytes Relative: 13 %
MCH: 30.4 pg (ref 26.0–34.0)
MCHC: 32.1 g/dL (ref 30.0–36.0)
MCV: 94.7 fL (ref 78.0–100.0)
Monocytes Absolute: 0.4 10*3/uL (ref 0.1–1.0)
Monocytes Relative: 6 %
NEUTROS PCT: 81 %
Neutro Abs: 4.6 10*3/uL (ref 1.7–7.7)
Platelets: 190 10*3/uL (ref 150–400)
RBC: 3.59 MIL/uL — AB (ref 3.87–5.11)
RDW: 16.8 % — ABNORMAL HIGH (ref 11.5–15.5)
WBC: 5.8 10*3/uL (ref 4.0–10.5)

## 2017-09-02 LAB — BASIC METABOLIC PANEL
Anion gap: 9 (ref 5–15)
BUN: 17 mg/dL (ref 6–20)
CHLORIDE: 105 mmol/L (ref 101–111)
CO2: 27 mmol/L (ref 22–32)
CREATININE: 0.98 mg/dL (ref 0.44–1.00)
Calcium: 8.4 mg/dL — ABNORMAL LOW (ref 8.9–10.3)
GFR calc Af Amer: >60 mL/min (ref >60)
GFR calc non Af Amer: >60 mL/min (ref >60)
GLUCOSE: 133 mg/dL — AB (ref 65–99)
POTASSIUM: 4 mmol/L (ref 3.5–5.1)
SODIUM: 141 mmol/L (ref 135–145)

## 2017-09-02 MED ORDER — HYDROCODONE-ACETAMINOPHEN 5-325 MG PO TABS
1.0000 | ORAL_TABLET | ORAL | Status: DC | PRN
  Administered 2017-09-03: 1 via ORAL
  Administered 2017-09-04: 2 via ORAL
  Administered 2017-09-04 – 2017-09-05 (×4): 1 via ORAL
  Administered 2017-09-05: 2 via ORAL
  Administered 2017-09-06 – 2017-09-08 (×10): 1 via ORAL
  Filled 2017-09-02: qty 1
  Filled 2017-09-02: qty 2
  Filled 2017-09-02 (×2): qty 1
  Filled 2017-09-02: qty 2
  Filled 2017-09-02 (×2): qty 1
  Filled 2017-09-02: qty 2
  Filled 2017-09-02: qty 1
  Filled 2017-09-02: qty 2
  Filled 2017-09-02 (×9): qty 1

## 2017-09-02 MED ORDER — FLUTICASONE PROPIONATE 50 MCG/ACT NA SUSP
2.0000 | Freq: Every day | NASAL | Status: DC
  Filled 2017-09-02 (×2): qty 16

## 2017-09-02 MED ORDER — ONDANSETRON HCL 4 MG/2ML IJ SOLN: 4.0000 mg | Freq: Once | INTRAMUSCULAR | Status: DC

## 2017-09-02 MED ORDER — TRAZODONE HCL 50 MG PO TABS
50.0000 mg | ORAL_TABLET | Freq: Every evening | ORAL | Status: DC | PRN
  Administered 2017-09-03: 50 mg via ORAL
  Filled 2017-09-02 (×2): qty 1

## 2017-09-02 MED ORDER — ACETAMINOPHEN 325 MG PO TABS
650.0000 mg | ORAL_TABLET | Freq: Once | ORAL | Status: AC
  Administered 2017-09-02: 650 mg via ORAL
  Filled 2017-09-02: qty 2

## 2017-09-02 MED ORDER — ONDANSETRON 4 MG PO TBDP
4.0000 mg | ORAL_TABLET | Freq: Once | ORAL | Status: AC
  Administered 2017-09-02: 4 mg via ORAL
  Filled 2017-09-02: qty 1

## 2017-09-02 MED ORDER — NIFEDIPINE ER OSMOTIC RELEASE 30 MG PO TB24
30.0000 mg | ORAL_TABLET | Freq: Every day | ORAL | Status: DC
  Filled 2017-09-02 (×2): qty 1

## 2017-09-02 MED ORDER — LEVOTHYROXINE SODIUM 112 MCG PO TABS
137.0000 ug | ORAL_TABLET | Freq: Every day | ORAL | Status: DC
  Administered 2017-09-03 – 2017-09-08 (×6): 137 ug via ORAL
  Filled 2017-09-02 (×8): qty 1

## 2017-09-02 MED ORDER — ONDANSETRON HCL 4 MG/2ML IJ SOLN: 4.0000 mg | Freq: Four times a day (QID) | INTRAMUSCULAR | Status: DC | PRN

## 2017-09-02 MED ORDER — ONDANSETRON HCL 4 MG PO TABS: 4.0000 mg | ORAL_TABLET | Freq: Four times a day (QID) | ORAL | Status: DC | PRN

## 2017-09-02 MED ORDER — PANTOPRAZOLE SODIUM 40 MG PO TBEC
40.0000 mg | DELAYED_RELEASE_TABLET | Freq: Every day | ORAL | Status: DC
  Administered 2017-09-03 – 2017-09-08 (×6): 40 mg via ORAL
  Filled 2017-09-02 (×6): qty 1

## 2017-09-02 MED ORDER — ONDANSETRON HCL 4 MG/2ML IJ SOLN
4.0000 mg | Freq: Once | INTRAMUSCULAR | Status: AC
  Administered 2017-09-02: 4 mg via INTRAVENOUS
  Filled 2017-09-02: qty 2

## 2017-09-02 MED ORDER — SENNA 8.6 MG PO TABS
2.0000 | ORAL_TABLET | Freq: Every day | ORAL | Status: DC
  Administered 2017-09-03 – 2017-09-08 (×6): 17.2 mg via ORAL
  Filled 2017-09-02 (×6): qty 2

## 2017-09-02 MED ORDER — SODIUM CHLORIDE 0.9 % IV BOLUS
1000.0000 mL | Freq: Once | INTRAVENOUS | Status: AC
  Administered 2017-09-02: 1000 mL via INTRAVENOUS

## 2017-09-02 MED ORDER — NITROFURANTOIN MONOHYD MACRO 100 MG PO CAPS
100.0000 mg | ORAL_CAPSULE | Freq: Once | ORAL | Status: AC
  Administered 2017-09-02: 100 mg via ORAL
  Filled 2017-09-02: qty 1

## 2017-09-02 MED ORDER — LISINOPRIL 20 MG PO TABS
40.0000 mg | ORAL_TABLET | Freq: Every day | ORAL | Status: DC
  Administered 2017-09-03 – 2017-09-08 (×7): 40 mg via ORAL
  Filled 2017-09-02 (×3): qty 2
  Filled 2017-09-02: qty 4
  Filled 2017-09-02 (×4): qty 2

## 2017-09-02 MED ORDER — DEXAMETHASONE SODIUM PHOSPHATE 10 MG/ML IJ SOLN
4.0000 mg | Freq: Four times a day (QID) | INTRAMUSCULAR | Status: DC
  Administered 2017-09-02 – 2017-09-08 (×23): 4 mg via INTRAVENOUS
  Filled 2017-09-02 (×23): qty 1

## 2017-09-02 MED ORDER — FONDAPARINUX SODIUM 7.5 MG/0.6ML ~~LOC~~ SOLN
7.5000 mg | Freq: Every day | SUBCUTANEOUS | Status: DC
Start: 2017-09-03 — End: 2017-09-07
  Administered 2017-09-03 – 2017-09-07 (×5): 7.5 mg via SUBCUTANEOUS
  Filled 2017-09-02 (×6): qty 0.6

## 2017-09-02 MED ORDER — SERTRALINE HCL 50 MG PO TABS
25.0000 mg | ORAL_TABLET | Freq: Every day | ORAL | Status: DC
  Administered 2017-09-03 – 2017-09-08 (×6): 25 mg via ORAL
  Filled 2017-09-02 (×6): qty 1

## 2017-09-02 MED ORDER — LORAZEPAM 2 MG/ML IJ SOLN
0.5000 mg | Freq: Once | INTRAMUSCULAR | Status: AC
  Administered 2017-09-02: 0.5 mg via INTRAVENOUS
  Filled 2017-09-02: qty 1

## 2017-09-02 MED ORDER — GADOBENATE DIMEGLUMINE 529 MG/ML IV SOLN
20.0000 mL | Freq: Once | INTRAVENOUS | Status: AC | PRN
  Administered 2017-09-02: 20 mL via INTRAVENOUS

## 2017-09-02 MED ORDER — BACLOFEN 10 MG PO TABS
20.0000 mg | ORAL_TABLET | Freq: Three times a day (TID) | ORAL | Status: DC
  Administered 2017-09-03 – 2017-09-08 (×16): 20 mg via ORAL
  Filled 2017-09-02 (×16): qty 2

## 2017-09-02 MED ORDER — SODIUM CHLORIDE 0.9 % IV SOLN
2.0000 g | Freq: Every day | INTRAVENOUS | Status: DC
  Administered 2017-09-03 – 2017-09-07 (×5): 2 g via INTRAVENOUS
  Filled 2017-09-02 (×6): qty 20
  Filled 2017-09-02: qty 2

## 2017-09-02 NOTE — H&P (Addendum)
History and Physical    Sarah [name] ZOX:[id number] DOB: [birth year] DOA: 09/02/2017  PCP: [name], MD   Patient coming from: Home  Chief Complaint: Fall, worsening leg weakness  HPI: Sarah [name] is a 52 y.o. 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  Patient has not been able to stand or walk since discharge.  Patient fell today when she was attempting to transfer from bed to wheelchair.  Patient saw her oncologist 08/24/2017 MRI was planned for yesterday but she could not make appointment due to lower extremity weakness.  She reports urinary incontinence, denies dysuria or abdominal pain.  Reports intermittent bilateral lower extremity swelling that is chronic and dependent on position.  Denies pain or redness of lower extremities.  No fever or chills.  No nausea or vomiting.  No shortness of breath no chest pain. Patient reports last dose of Xarelto was Sunday 05/30/2017, as she felt she might need surgery for her lower extremity weakness.  She has not taking the edema since hospital discharge.  Recent hospital admission 519 10/22/1912 for biliary colic. Lenvima and Xarelto held.  Patient sent home on fondaparinux which she has not taken since seeing her oncologist [name]- 08/24/2017.  ED Course: Blood pressure elevated systolic 782N to 562Z.  WBC 5.8, hemoglobin at baseline 10.9, unremarkable BMP creatinine, UA large leukocytes rare bacteria.  Right shoulder x-ray moderate OA, no acute abnormality, head and cervical spine CT negative for acute abnormality.  MRI thoracic and lumbar spine-interval worsening a metastatic lesion C3 worsened mass-effect on thecal sac and evidence for cord compression, neurosurgery consultation recommended.Dr. Howard Pouch hospitalist admission,  Decadron, radiation and medical oncology consult, no emergent decompression needed tonight.   Patient was also started on Macrobid for UTI.  Review of Systems: As per HPI otherwise 10 point review of systems negative.   Past Medical History:  Diagnosis Date  . 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 Chronic back pain   . DDD (degenerative disc disease), cervical   . DJD (degenerative joint disease)   . History of rectal fissure   . HIT (heparin-induced thrombocytopenia) ([place])   . Hypertension   . Obesity   . Paraplegia at T4 level ([place])   . Pulmonary embolus, right ([place]) 2015  . Rotator cuff tendonitis right    Past Surgical History:  Procedure Laterality Date  . LAMINECTOMY N/A 12/14/2013   Procedure: THORACIC LAMINECTOMY FOR TUMOR THORACIC THREE;  Surgeon: Ashok Pall, MD;  Location: [place] NEURO ORS;  Service: Neurosurgery;  Laterality: N/A;  THORACIC LAMINECTOMY FOR TUMOR THORACIC THREE  . LAMINECTOMY N/A 07/05/2015   Procedure: THORACIC LAMINECTOMY FOR TUMOR;  Surgeon: Ashok Pall, MD;  Location: [place] NEURO ORS;  Service: Neurosurgery;  Laterality: N/A;  . POSTERIOR LUMBAR FUSION 4 LEVEL N/A 12/30/2013   Procedure: Thoracic one-Thoracic five posterior thoracic fusion with pedicle screws;  Surgeon: Ashok Pall, MD;  Location: [place] NEURO ORS;  Service: Neurosurgery;  Laterality: N/A;  Thoracic one-Thoracic five posterior thoracic fusion with pedicle screws  . THYROIDECTOMY N/A 01/09/2014   Procedure: TOTAL THYROIDECTOMY;  Surgeon: Izora Gala, MD;  Location: [place];  Service: ENT;  Laterality: N/A;  . TONSILLECTOMY       reports that she has never smoked. She has never  used smokeless tobacco. She reports that she does not drink alcohol or use drugs.  Allergies  Allergen Reactions  . Bee Venom Anaphylaxis  . Keflex [Cephalexin] Nausea And Vomiting  . Tramadol Nausea And Vomiting  . Gabapentin Nausea And Vomiting  . Heparin Other (See Comments)     Weak positive platelets induced antibodies, SRA negative--2015  . Hydrochlorothiazide Nausea And Vomiting  . Hydrocodone Nausea And Vomiting  . Oxycodone-Acetaminophen Nausea And Vomiting    Family History  Problem Relation Age of Onset  . Hypertension Mother   . Diabetes Mother   . Hypertension Father   . Stroke Father   . Diabetes Father   . Diabetes Sister   . Diabetes Sister     Prior to Admission medications   Medication Sig Start Date End Date Taking? Authorizing Provider  baclofen (LIORESAL) 20 MG tablet TAKE (1) TABLET THREE TIMES DAILY. 08/31/17  Yes [name], MD  colchicine 0.6 MG tablet TAKE&nbsp;&nbsp;(1)&nbsp;&nbsp;TABLET TWICE A DAY AS NEEDED. 07/16/17  Yes [name], MD  fluticasone [facility]) 50 MCG/ACT nasal spray Place 2 sprays into both nostrils daily. 04/01/17  Yes Carlyle Dolly, MD  furosemide (LASIX) 20 MG tablet Take 1 tablet (20 mg total) by mouth daily as needed. Patient taking differently: Take 20 mg by mouth daily as needed for fluid.  09/05/16  Yes Brunetta Genera, MD  levothyroxine (SYNTHROID, LEVOTHROID) 137 MCG tablet Take 137 mcg by mouth daily before breakfast.   Yes [provider]  lisinopril (PRINIVIL,ZESTRIL) 20 MG tablet Take 2 tablets (40 mg total) by mouth daily. 01/02/17  Yes Brunetta Genera, MD  NIFEdipine (PROCARDIA XL) 30 MG 24 hr tablet Take 1 tablet (30 mg total) by mouth daily. 04/13/17  Yes Brunetta Genera, MD  ondansetron (ZOFRAN) 4 MG tablet Take 1 tablet (4 mg total) by mouth every 6 (six) hours. 06/22/17  Yes [name], MD  pantoprazole (PROTONIX) 40 MG tablet Take 1 tablet (40 mg total) by mouth daily. 07/16/17  Yes [name], MD  rivaroxaban (XARELTO) 20 MG TABS tablet Take 1 tablet (20 mg total) by mouth daily with supper. Hold 3 days prior to cholecystectomy 08/24/17  Yes Brunetta Genera, MD  senna (SENOKOT) 8.6 MG TABS tablet Take 2 tablets (17.2 mg total) by mouth  daily. 05/13/16  Yes Bayard Hugger, NP  sertraline (ZOLOFT) 25 MG tablet Take 1 tablet (25 mg total) by mouth daily. 05/06/17  Yes [name], MD  fondaparinux (ARIXTRA) 2.5 MG/0.5ML SOLN injection Inject 0.5 mLs (2.5 mg total) into the skin daily at 6 (six) AM. Patient not taking: Reported on 09/02/2017 08/21/17   Tonette Bihari, MD    Physical Exam: Vitals:   09/02/17 2006 09/02/17 2030 09/02/17 2100 09/02/17 2130  BP: (!) 163/103 (!) 178/111 (!) 185/116 (!) 177/103  Pulse: (!) 104 (!) 101 (!) 103 (!) 104  Resp: (!) 26 (!) 21 (!) 23 (!) 29  Temp:      TempSrc:      SpO2: 100% 100% 100% 100%    Constitutional: NAD, calm, comfortable Vitals:   09/02/17 2006 09/02/17 2030 09/02/17 2100 09/02/17 2130  BP: (!) 163/103 (!) 178/111 (!) 185/116 (!) 177/103  Pulse: (!) 104 (!) 101 (!) 103 (!) 104  Resp: (!) 26 (!) 21 (!) 23 (!) 29  Temp:      TempSrc:      SpO2: 100% 100% 100% 100%   Eyes: PERRL, lids and  conjunctivae normal ENMT: Mucous membranes are moist. Posterior pharynx clear of any exudate or lesions.Normal dentition.  Neck: normal, supple, no masses, no thyromegaly Respiratory: clear to auscultation bilaterally, no wheezing, no crackles. Normal respiratory effort. No accessory muscle use.  Cardiovascular: Regular rate and rhythm, no murmurs / rubs / gallops.  Plus pitting pedal edema to mid leg left lower extremity, with differential warmth and redness mostly medial third of right leg, without pain or tenderness. 2+ pedal pulses.   Abdomen: no tenderness, no masses palpated. No hepatosplenomegaly. Bowel sounds positive.  Musculoskeletal: no clubbing / cyanosis. No joint deformity upper and lower extremities. Good ROM, no contractures. Normal muscle tone.  Skin: Diffuse bruising mostly lower extremities ulcers, few on chest.  Patient's denies frequent falls, no induration Neurologic: CN 2-12 grossly intact. Sensation intact, DTR normal. Strength 5/5 in all 4.    Psychiatric: Normal judgment and insight. Alert and oriented x 3. Normal mood.   Labs on Admission: I have personally reviewed following labs and imaging studies  CBC: Recent Labs  Lab 09/02/17 1506  WBC 5.8  NEUTROABS 4.6  HGB 10.9*  HCT 34.0*  MCV 94.7  PLT 099   Basic Metabolic Panel: Recent Labs  Lab 09/02/17 1506  NA 141  K 4.0  CL 105  CO2 27  GLUCOSE 133*  BUN 17  CREATININE 0.98  CALCIUM 8.4*   Urine analysis:    Component Value Date/Time   COLORURINE YELLOW 09/02/2017 1646   APPEARANCEUR TURBID (A) 09/02/2017 1646   LABSPEC 1.013 09/02/2017 1646   PHURINE 5.0 09/02/2017 1646   GLUCOSEU NEGATIVE 09/02/2017 1646   HGBUR SMALL (A) 09/02/2017 1646   BILIRUBINUR NEGATIVE 09/02/2017 1646   BILIRUBINUR MODERATE 03/03/2014 1028   KETONESUR 5 (A) 09/02/2017 1646   PROTEINUR 30 (A) 09/02/2017 1646   UROBILINOGEN 1.0 03/11/2014 2130   NITRITE POSITIVE (A) 09/02/2017 1646   LEUKOCYTESUR LARGE (A) 09/02/2017 1646    Radiological Exams on Admission: Dg Shoulder Right  Result Date: 09/02/2017 CLINICAL DATA:  Pain following fall EXAM: RIGHT SHOULDER - 2+ VIEW COMPARISON:  None. FINDINGS: Frontal and Y scapular images were obtained. There is no appreciable fracture or dislocation. There is moderate generalized osteoarthritic change. No erosive change or intra-articular calcification. Visualized right lung is clear. Postoperative changes present in the upper thoracic region. 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 No fracture or dislocation. Electronically Signed   By: Lowella Grip III M.D.   On: 09/02/2017 14:04   Ct Head Wo Contrast  Result Date: 09/02/2017 CLINICAL DATA:  Status post fall moving from a bed to a wheelchair today. Initial encounter. EXAM: CT HEAD WITHOUT CONTRAST CT CERVICAL SPINE WITHOUT CONTRAST TECHNIQUE: Multidetector CT imaging of the head and cervical spine was performed following the standard protocol without  intravenous contrast. Multiplanar CT image reconstructions of the cervical spine were also generated. COMPARISON:  Head CT scan 12/22/2016.  PET CT scan 05/20/2017. FINDINGS: CT HEAD FINDINGS Brain: No evidence of acute infarction, hemorrhage, hydrocephalus, extra-axial collection or mass lesion/mass effect. Vascular: A few atherosclerotic calcifications are seen. Skull: Intact.  No focal lesion. Sinuses/Orbits: Mild mucosal thickening is noted in the maxillary sinuses, frontal sinuses, sphenoid sinus and scattered ethmoid air cells. Other: None. CT CERVICAL SPINE FINDINGS Alignment: Maintained. Skull base and vertebrae: No acute fracture. No primary bone lesion or focal pathologic process. Soft tissues and spinal canal: No prevertebral fluid or swelling. No visible canal hematoma. Disc levels: Intervertebral disc space height is maintained.  There is some endplate spurring most notable at C3-4 and C5-6. Partial visualization of thoracic fusion hardware noted. Upper chest: 0.5 cm left upper lobe pulmonary nodule is seen on prior PET CT scan is noted. Other: None. IMPRESSION: No acute abnormality head or cervical spine. Atherosclerosis. Sinus disease. Mild appearing cervical spondylosis. No change in 0.5 cm left upper lobe pulmonary nodule consistent with the patient's known metastatic thyroid carcinoma. Electronically Signed   By: Inge Rise M.D.   On: 09/02/2017 14:37   Ct Cervical Spine Wo Contrast  Result Date: 09/02/2017 CLINICAL DATA:  Status post fall moving from a bed to a wheelchair today. Initial encounter. EXAM: CT HEAD WITHOUT CONTRAST CT CERVICAL SPINE WITHOUT CONTRAST TECHNIQUE: Multidetector CT imaging of the head and cervical spine was performed following the standard protocol without intravenous contrast. Multiplanar CT image reconstructions of the cervical spine were also generated. COMPARISON:  Head CT scan 12/22/2016.  PET CT scan 05/20/2017. FINDINGS: CT HEAD FINDINGS Brain: No evidence  of acute infarction, hemorrhage, hydrocephalus, extra-axial collection or mass lesion/mass effect. Vascular: A few atherosclerotic calcifications are seen. Skull: Intact.  No focal lesion. Sinuses/Orbits: Mild mucosal thickening is noted in the maxillary sinuses, frontal sinuses, sphenoid sinus and scattered ethmoid air cells. Other: None. CT CERVICAL SPINE FINDINGS Alignment: Maintained. Skull base and vertebrae: No acute fracture. No primary bone lesion or focal pathologic process. Soft tissues and spinal canal: No prevertebral fluid or swelling. No visible canal hematoma. Disc levels: Intervertebral disc space height is maintained. There is some endplate spurring most notable at C3-4 and C5-6. Partial visualization of thoracic fusion hardware noted. Upper chest: 0.5 cm left upper lobe pulmonary nodule is seen on prior PET CT scan is noted. Other: None. IMPRESSION: No acute abnormality head or cervical spine. Atherosclerosis. Sinus disease. Mild appearing cervical spondylosis. No change in 0.5 cm left upper lobe pulmonary nodule consistent with the patient's known metastatic thyroid carcinoma. Electronically Signed   By: Inge Rise M.D.   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 T3 metastasis with history of prior decompression. EXAM: MRI THORACIC AND LUMBAR SPINE WITHOUT AND WITH CONTRAST TECHNIQUE: Multiplanar and multiecho pulse sequences of the thoracic and lumbar spine were obtained without and with intravenous contrast. CONTRAST:  38mL MULTIHANCE GADOBENATE DIMEGLUMINE 529 MG/ML IV SOLN COMPARISON:  Previous MRI from 12/25/2015. FINDINGS: MRI THORACIC SPINE FINDINGS Alignment: Examination mildly limited as the patient was unable to tolerate the full length of the exam. Postcontrast axial sequences as well as  post-contrast imaging through the lumbar spine was not performed. Stable alignment with preservation of the normal thoracic kyphosis. No listhesis. Vertebrae: Postoperative changes from prior fusion from T1 through T5 for T3 decompression again seen. Pathologic fracture deformity of T3 is relatively unchanged in appearance. Interval growth with progressive tumor seen within the adjacent spinal canal, extending from the mid aspect of T2 through the mid aspect of T4 (series 7, image 8). There has been marked interval worsening of cord and thecal sac progression, with the sac compressed laterally and slit like in appearance, measuring approximately 2 mm in transverse diameter at its most narrow point (series 10, image 12). Suspected cord signal abnormality at this level, although poorly visualized due to susceptibility artifact from adjacent hardware. Postoperative fluid collection again noted at the adjacent laminectomy site, likely slightly decreased in size.  No other metastatic lesions identified. Reactive marrow edema and enhancement about the right T12-L1 facet favored to be degenerative in nature (series 6, image 4). Vertebral body heights otherwise maintained. Mild discogenic reactive endplate changes present at the thoracolumbar junction. Cord:  No other cord signal abnormality identified. Paraspinal and other soft tissues: No other acute paraspinous soft tissue abnormality. Probable mild cholelithiasis noted. Visualized visceral structures otherwise unremarkable. Disc levels: T7-8: Central disc extrusion flattens the ventral thoracic spinal cord and resultant moderate spinal stenosis, stable. Left paracentral disc protrusion at T8-9 with mild flattening of the left hemi cord also unchanged. MRI LUMBAR SPINE FINDINGS Segmentation: Normal segmentation. Lowest well-formed disc labeled the L5-S1 level. Alignment: Levoscoliosis. Vertebral bodies otherwise normally aligned with preservation of the normal lumbar  lordosis. No listhesis. Vertebrae: Mild height loss with reactive endplate edema at the left aspect of the superior endplate of L4, suspicious for mild compression fracture (series 14, image 12). No bony retropulsion. This is benign in appearance, with no underlying lesion identified. No worrisome osseous lesions or evidence for metastatic disease identified within the lumbar spine. Prominent reactive marrow edema about the left L3-4 facet due to advanced facet degeneration. Conus medullaris: Extends to the L1 level and appears normal. Paraspinal and other soft tissues: Paraspinous soft tissues within normal limits. Visualized visceral structures unremarkable. Disc levels: L1-2:  Mild facet and ligament flavum hypertrophy.  No stenosis. L2-3: Minimal disc bulge with bilateral facet hypertrophy. No significant stenosis. L3-4: Disc desiccation with minimal disc bulge. Prominent anterior bridging osteophyte. Advanced bilateral facet degeneration with associated reactive marrow edema, left greater than right. 7 mm synovial cyst noted at the posterior aspect of the right L3-4 facet. Mild canal with bilateral L3 foraminal stenosis. L4-5: Chronic intervertebral disc mild diffuse disc bulge. Moderate to advanced facet arthrosis, left greater than right. Resultant moderate canal with bilateral subarticular stenosis. Foramina remain patent. L5-S1: Chronic intervertebral disc space narrowing with diffuse disc bulge. Left greater than right facet degeneration. Resultant moderate to severe left lateral recess narrowing. Mild right with mild to moderate left L5 foraminal stenosis. IMPRESSION: MRI THORACIC SPINE IMPRESSION 1. 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 Neuro surgical consultation recommended. 2. Superimposed sequelae of prior T1 through T5 fusion for T3 decompression. 3. Stable disc protrusions at T7-8 and T8-9. 4. Prominent  facet arthropathy at T12-L1 on the right with associated reactive marrow edema. MRI LUMBAR SPINE IMPRESSION 1. No evidence for metastatic disease within the lumbar spine. 2. Mild acute to subacute endplate compression deformity involving the left aspect of the superior endplate of L4. This is benign in appearance with no underlying pathologic lesion. 3. Advanced bilateral facet degeneration at L3-4 with associated reactive marrow edema, left greater than right. 4. Multilevel degenerative spondylolysis as above, most notable at L4-5 and L5-S1. Resultant moderate canal stenosis at L4-5, with moderate to severe left lateral recess narrowing at L5-S1. Critical Value/emergent results were called by telephone at the time of interpretation on 09/02/2017 at 8:30 pm to the ER PA [place] , who verbally acknowledged these results. Electronically Signed   By: Jeannine Boga M.D.   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 EXAM: MRI THORACIC AND LUMBAR SPINE WITHOUT AND  WITH CONTRAST TECHNIQUE: Multiplanar and multiecho pulse sequences of the thoracic and lumbar spine were obtained without and with intravenous contrast. CONTRAST:  66mL MULTIHANCE GADOBENATE DIMEGLUMINE 529 MG/ML IV SOLN COMPARISON:  Previous MRI from 12/25/2015. FINDINGS: MRI THORACIC SPINE FINDINGS Alignment: Examination mildly limited as the patient was unable to tolerate the full length of the exam. Postcontrast axial sequences as well as post-contrast imaging through the lumbar spine was not performed. Stable alignment with preservation of the normal thoracic kyphosis. No listhesis. Vertebrae: Postoperative changes from prior fusion from T1 through T5 for T3 decompression again seen. Pathologic fracture  deformity of T3 is relatively unchanged in appearance. Interval growth with progressive tumor seen within the adjacent spinal canal, extending from the mid aspect of T2 through the mid aspect of T4 (series 7, image 8). There has been marked interval worsening of cord and thecal sac progression, with the sac compressed laterally and slit like in appearance, measuring approximately 2 mm in transverse diameter at its most narrow point (series 10, image 12). Suspected cord signal abnormality at this level, although poorly visualized due to susceptibility artifact from adjacent hardware. Postoperative fluid collection again noted at the adjacent laminectomy site, likely slightly decreased in size. No other metastatic lesions identified. Reactive marrow edema and enhancement about the right T12-L1 facet favored to be degenerative in nature (series 6, image 4). Vertebral body heights otherwise maintained. Mild discogenic reactive endplate changes present at the thoracolumbar junction. Cord:  No other cord signal abnormality identified. Paraspinal and other soft tissues: No other acute paraspinous soft tissue abnormality. Probable mild cholelithiasis noted. Visualized visceral structures otherwise unremarkable. Disc levels: T7-8: Central disc extrusion flattens the ventral thoracic spinal cord and resultant moderate spinal stenosis, stable. Left paracentral disc protrusion at T8-9 with mild flattening of the left hemi cord also unchanged. MRI LUMBAR SPINE FINDINGS Segmentation: Normal segmentation. Lowest well-formed disc labeled the L5-S1 level. Alignment: Levoscoliosis. Vertebral bodies otherwise normally aligned with preservation of the normal lumbar lordosis. No listhesis. Vertebrae: Mild height loss with reactive endplate edema at the left aspect of the superior endplate of L4, suspicious for mild compression fracture (series 14, image 12). No bony retropulsion. This is benign in appearance, with no underlying lesion  identified. No worrisome osseous lesions or evidence for metastatic disease identified within the lumbar spine. Prominent reactive marrow edema about the left L3-4 facet due to advanced facet degeneration. Conus medullaris: Extends to the L1 level and appears normal. Paraspinal and other soft tissues: Paraspinous soft tissues within normal limits. Visualized visceral structures unremarkable. Disc levels: L1-2:  Mild facet and ligament flavum hypertrophy.  No stenosis. L2-3: Minimal disc bulge with bilateral facet hypertrophy. No significant stenosis. L3-4: Disc desiccation with minimal disc bulge. Prominent anterior bridging osteophyte. Advanced bilateral facet degeneration with associated reactive marrow edema, left greater than right. 7 mm synovial cyst noted at the posterior aspect of the right L3-4 facet. Mild canal with bilateral L3 foraminal stenosis. L4-5: Chronic intervertebral disc mild diffuse disc bulge. Moderate to advanced facet arthrosis, left greater than right. Resultant moderate canal with bilateral subarticular stenosis. Foramina remain patent. L5-S1: Chronic intervertebral disc space narrowing with diffuse disc bulge. Left greater than right facet degeneration. Resultant moderate to severe left lateral recess narrowing. Mild right with mild to moderate left L5 foraminal stenosis. IMPRESSION: MRI THORACIC SPINE IMPRESSION 1. 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 Neuro surgical consultation recommended. 2. Superimposed sequelae  of prior T1 through T5 fusion for T3 decompression. 3. Stable disc protrusions at T7-8 and T8-9. 4. Prominent facet arthropathy at T12-L1 on the right with associated reactive marrow edema. MRI LUMBAR SPINE IMPRESSION 1. No evidence for metastatic disease within the lumbar spine. 2. Mild acute to subacute endplate compression deformity involving the left aspect of the superior  endplate of L4. This is benign in appearance with no underlying pathologic lesion. 3. Advanced bilateral facet degeneration at L3-4 with associated reactive marrow edema, left greater than right. 4. Multilevel degenerative spondylolysis as above, most notable at L4-5 and L5-S1. Resultant moderate canal stenosis at L4-5, with moderate to severe left lateral recess narrowing at L5-S1. Critical Value/emergent results were called by telephone at the time of interpretation on 09/02/2017 at 8:30 pm to the ER PA Tracy , who verbally acknowledged these results. Electronically Signed   By: Jeannine Boga M.D.   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 MRI-thoracic and lumbar spine-interval worsening in metastatic lesion centered at T3 with worsening mass-effect on the to call cord and evidence for cord compression.  Neurosurgery consulted, Dr. Howard Pouch hospitalist admission, Decadron, radiation and medical oncology consult, no emergent decompression needed tonight.  Dr. Cyndy Freeze is patient's neurosurgeon -Continue Decadron 4 mg every 6 IV started in ED -N.p.o. Midnight -Follow-up neurosurgery Recs tomorrow -Consult medical and radiation oncology in a.m.  History of PE-2015 recent hospital admission 4/65-6/81/27-[facility] biliary colic, patient developed thrombocytopenia- as low as 50, platelets improved today 190.  Thrombocytopenia was thought to be secondary to patient's chemotherapy- lenvima at that time, this was held to be resumed by outpatient oncologist per His discretion.  Anticoagulation with Xarelto also had patient was to take fondaparinux (remote  history of HIT 2015). 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, last visit 08/24/2017.  Spinal cord compression 2017-requiring emergent laminectomy and partial tumor resection at T3,  -Cont to Hold Lenvima following Dr. Grier Mitts note  HTN-blood pressure elevated systolic 001V to 494W. -Resume home lisinopril 40, nifedipine 30   Left lower extremity swelling-with redness, warmth , and diffuse bruising without pain or tenderness, differential DVT versus cellulitis.  WBC 5.8. Afebrile. Last Xarelto dose 08/30/2017, patient decided to stop it as she felt she might need surgery. -Will start IV ceftriaxone 2 g daily -Left lower extremity venous Dopplers -Will need to resume anticoagulation considering high risk for thromboembolic disease, considering remote history of HIT 2015, and possible surgery ?  If and when, I talked to Dr. Alen Blew on-call for medical oncology, recommends using arixtra at this time, and there will be enough time to stop this medication in preparation for surgery.   UTI-denies dysuria, but now with urinary incontinence.  UA suggestive.  WBC 5.8. Afebrile.  Macrobid started in ED. -IV ceftriaxone 2 g daily   DVT prophylaxis: Fondaparinux Code Status: Full Family Communication:None at bedside  Disposition Plan: per rounding team Consults called:Neurosurgery Admission status:  inpt, med surg   Bethena Roys MD [facility] Pager [phone] From 6PM-2AM.  Otherwise please contact night-coverage [email or web address] Password [facility]  09/02/2017, 10:17 PM

## 2017-09-02 NOTE — ED Notes (Signed)
Patient c/o nausea. PA made aware.

## 2017-09-02 NOTE — Telephone Encounter (Signed)
I called patient to ask more information related to her not being able to get into a car.  She stated she had to cancel her MRI yesterday and her MD appointment today because she cannot physically get into a car.  She says it is too difficult due to her wheelchair.  I sent a message to Social Work to ask if there were any other options other than having an ambulance take her to the hospital.

## 2017-09-02 NOTE — ED Notes (Signed)
ED Provider at bedside. 

## 2017-09-02 NOTE — ED Triage Notes (Addendum)
Per EMS, patient from home, reports fall transferring from bed to wheel chair. C/o back pain. Denies head injury and LOC. Hx chronic back pain and HTN. C-collar in place.

## 2017-09-02 NOTE — Progress Notes (Signed)
ANTICOAGULATION CONSULT NOTE - Initial Consult  Pharmacy Consult for Arixtra Indication: hx of  DVT and PE  Allergies  Allergen Reactions  . Bee Venom Anaphylaxis  . Keflex [Cephalexin] Nausea And Vomiting  . Tramadol Nausea And Vomiting  . Gabapentin Nausea And Vomiting  . Heparin Other (See Comments)    Weak positive platelets induced antibodies, SRA negative--2015  . Hydrochlorothiazide Nausea And Vomiting  . Hydrocodone Nausea And Vomiting  . Oxycodone-Acetaminophen Nausea And Vomiting    Patient Measurements:   Heparin Dosing Weight: pending  Vital Signs: Temp: 97.6 F (36.4 C) (06/12 1254) Temp Source: Oral (06/12 1254) BP: 166/120 (06/12 2300) Pulse Rate: 98 (06/12 2300)  Labs: Recent Labs    09/02/17 1506  HGB 10.9*  HCT 34.0*  PLT 190  CREATININE 0.98    CrCl cannot be calculated (Unknown ideal weight.).   Medical History: Past Medical History:  Diagnosis Date  . Cancer [facility])    FNA of thyroid positive for onconytic/hurthle cell carcinoma  . Chronic back pain   . DDD (degenerative disc disease), cervical   . DJD (degenerative joint disease)   . History of rectal fissure   . HIT (heparin-induced thrombocytopenia) ([place])   . Hypertension   . Obesity   . Paraplegia at T4 level ([place])   . Pulmonary embolus, right ([place]) 2015  . Rotator cuff tendonitis right    Medications:  Scheduled:  . baclofen  20 mg Oral TID  . [START ON 09/03/2017] dexamethasone  4 mg Intravenous Q6H  . [START ON 09/03/2017] fluticasone  2 spray Each Nare Daily  . fondaparinux (ARIXTRA) injection  7.5 mg Subcutaneous Q0600  . [START ON 09/03/2017] levothyroxine  137 mcg Oral QAC breakfast  . lisinopril  40 mg Oral Daily  . NIFEdipine  30 mg Oral Daily  . [START ON 09/03/2017] pantoprazole  40 mg Oral Daily  . [START ON 09/03/2017] senna  2 tablet Oral Daily  . [START ON 09/03/2017] sertraline  25 mg Oral Daily   Infusions:  . cefTRIAXone (ROCEPHIN)  IV      Assessment: 52  yo with hx of stage IV- Hurtle cell carcinoma of the thyroid with meds to spine and PE.     Plan:  Arixtra 7.5 mg daily F/u weight when on floor F/u scr, CBC, plts  Sarah [name] 09/02/2017,11:47 PM

## 2017-09-02 NOTE — ED Notes (Signed)
Bed: [facility] Expected date:  Expected time:  Means of arrival:  Comments: EMS fall

## 2017-09-02 NOTE — Progress Notes (Signed)
Pt came down to MRI and was continually vomiting. Pt was unable to lay flat. Until pt can tolerate laying flat for 90 mins due to nausea and pain. Pt was sent back and RN informed with pt's current status she is unable to preform the double study MRI.

## 2017-09-02 NOTE — ED Notes (Signed)
Patient transported to MRI 

## 2017-09-02 NOTE — ED Notes (Signed)
Patient transported to CT 

## 2017-09-02 NOTE — ED Notes (Signed)
Patient placed on pure wick °

## 2017-09-02 NOTE — ED Notes (Signed)
Patient sent back from MRI, unable to complete at this time. MRI reports patient had large episode of green emesis in MRI prior to beginning MRI. PA made aware.

## 2017-09-02 NOTE — ED Provider Notes (Signed)
[facility] DEPT Provider Note   CSN: [id number] Arrival date & time: 09/02/17  1241     History   Chief Complaint Chief Complaint  Patient presents with  . Fall  . Back Pain    HPI Sarah [name] is a 52 y.o. 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  Patient states that she was using her sliding board to go from her bed to her wheelchair when she fell down onto her buttocks on the floor.  Since then she has had 6/10 constant sharp nonradiating right shoulder pain that worsens with movement, and with no treatments tried prior to arrival.  She also reports associated neck pain.  She is on blood thinners, but she denies head injury or LOC.  She states that for the last 2 weeks she has had gradually worsening bilateral leg weakness, she was scheduled for an MRI of her thoracic and lumbar spine yesterday however she had to cancel it because she could not make it to the appointment due to the difficulty with mobility.  Her oncologist is [name].  Her neurosurgeon is Dr. Christella Noa.  She denies any changes to her chronic back pain since the fall.  She also denies any headache, vision changes, chest pain, shortness of breath, abdominal pain, new or worsening nausea, vomiting, or any other injuries or complaints at this time.  She denies any recent fevers or chills, urinary frequency, or changes in urination although she states it has smelled stronger.  Of note, she did not take her blood pressure medication today.  She has baseline incontinence and numbness and tingling in her legs which has not changed since the accident.  Uses briefs, does not have indwelling cath.   The history is  provided by the patient and medical records. No language interpreter was used.    Past Medical History:  Diagnosis Date  . 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 Chronic back pain   . DDD (degenerative disc disease), cervical   . DJD (degenerative joint disease)   . History of rectal fissure   . HIT (heparin-induced thrombocytopenia) ([place])   . Hypertension   . Obesity   . Paraplegia at T4 level ([place])   . Pulmonary embolus, right ([place]) 2015  . Rotator cuff tendonitis right    Patient Active Problem List   Diagnosis Date Noted  . Calculus of gallbladder without cholecystitis without obstruction   . Biliary dyskinesia   . Intractable nausea and vomiting 08/09/2017  . Thrombocytopenia ([place]) 08/09/2017  . Elevated bilirubin 08/09/2017  . AKI (acute kidney injury) ([place]) 08/09/2017  . Nasal congestion 04/05/2017  . Breast pain 01/26/2017  . Upper respiratory infection, viral 01/26/2017  . Counseling regarding advanced care planning and goals of care 01/02/2017  . Bilateral rotator cuff syndrome 02/12/2016  . E. coli UTI 08/01/2015  . Constipation due to neurogenic bowel 08/01/2015  . Muscle spasticity   . 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 Spastic paraparesis   . Neurogenic bladder   . History of pulmonary embolism   . Post-operative pain   . Metastatic cancer ([place])   . Steroid-induced hyperglycemia   . Depression   .  Obstipation   . Thoracic spine tumor 07/06/2015  . 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 Cord compression ([place])   . Postoperative hypothyroidism   . 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 Spasticity 09/19/2014  . Dysuria 09/04/2014  . Hypertension 05/01/2014  . Ingrown right big toenail 05/01/2014  . GERD (gastroesophageal reflux disease) 02/14/2014  . Dysphagia, pharyngoesophageal phase 02/06/2014  . Type 2 diabetes mellitus without complication ([place])  [id number]  . Constipation   . Neurogenic bowel   . Paraplegia at T4 level ([place]) 01/20/2014  . Metastatic cancer to spine ([name]) 01/19/2014  . Rotator cuff tendonitis   . 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 Leg weakness, bilateral 12/13/2013    Past Surgical History:  Procedure Laterality Date  . LAMINECTOMY N/A 12/14/2013   Procedure: THORACIC LAMINECTOMY FOR TUMOR THORACIC THREE;  Surgeon: Ashok Pall, MD;  Location: [facility] NEURO ORS;  Service: Neurosurgery;  Laterality: N/A;  THORACIC LAMINECTOMY FOR TUMOR THORACIC THREE  . LAMINECTOMY N/A 07/05/2015   Procedure: THORACIC LAMINECTOMY FOR TUMOR;  Surgeon: Ashok Pall, MD;  Location: [facility] NEURO ORS;  Service: Neurosurgery;  Laterality: N/A;  . POSTERIOR LUMBAR FUSION 4 LEVEL N/A 12/30/2013   Procedure: Thoracic one-Thoracic five posterior thoracic fusion with pedicle screws;  Surgeon: Ashok Pall, MD;  Location: [facility] NEURO ORS;  Service: Neurosurgery;  Laterality: N/A;  Thoracic one-Thoracic five posterior thoracic fusion with pedicle screws  . THYROIDECTOMY N/A 01/09/2014   Procedure: TOTAL THYROIDECTOMY;  Surgeon: Izora Gala, MD;  Location: [facility];  Service: ENT;  Laterality: N/A;  . TONSILLECTOMY       OB History   None      Home Medications    Prior to Admission medications   Medication Sig Start Date End Date Taking? Authorizing Provider  baclofen (LIORESAL) 20 MG tablet TAKE (1) TABLET THREE TIMES DAILY. 08/31/17   [name], MD  colchicine 0.6 MG tablet TAKE&nbsp;&nbsp;(1)&nbsp;&nbsp;TABLET TWICE A DAY AS NEEDED. 07/16/17   [name], MD  fluticasone [facility]) 50 MCG/ACT nasal spray Place 2 sprays into both nostrils daily. 04/01/17   Carlyle Dolly, MD  fondaparinux (ARIXTRA) 2.5 MG/0.5ML SOLN injection Inject 0.5 mLs (2.5 mg total) into the skin daily at 6 (six) AM. 08/21/17   Mikell, Jeani Sow, MD  furosemide (LASIX) 20 MG tablet Take 1 tablet  (20 mg total) by mouth daily as needed. Patient taking differently: Take 20 mg by mouth daily as needed for fluid.  09/05/16   Brunetta Genera, MD  levothyroxine (SYNTHROID, LEVOTHROID) 137 MCG tablet Take 137 mcg by mouth daily before breakfast.    [provider]  lisinopril (PRINIVIL,ZESTRIL) 20 MG tablet Take 2 tablets (40 mg total) by mouth daily. 01/02/17   Brunetta Genera, MD  NIFEdipine (PROCARDIA XL) 30 MG 24 hr tablet Take 1 tablet (30 mg total) by mouth daily. 04/13/17   Brunetta Genera, MD  ondansetron (ZOFRAN) 4 MG tablet Take 1 tablet (4 mg total) by mouth every 6 (six) hours. 06/22/17   [name], MD  pantoprazole (PROTONIX) 40 MG tablet Take 1 tablet (40 mg total) by mouth daily. 07/16/17   [name], MD  rivaroxaban (XARELTO) 20 MG TABS tablet Take 1 tablet (20 mg total) by mouth daily with supper. Hold 3 days prior to cholecystectomy 08/24/17   Brunetta Genera, MD  senna (SENOKOT) 8.6 MG TABS tablet  Take 2 tablets (17.2 mg total) by mouth daily. 05/13/16   Bayard Hugger, NP  sertraline (ZOLOFT) 25 MG tablet Take 1 tablet (25 mg total) by mouth daily. 05/06/17   [name], MD    Family History Family History  Problem Relation Age of Onset  . Hypertension Mother   . Diabetes Mother   . Hypertension Father   . Stroke Father   . Diabetes Father   . Diabetes Sister   . Diabetes Sister     Social History Social History   Tobacco Use  . Smoking status: Never Smoker  . Smokeless tobacco: Never Used  Substance Use Topics  . Alcohol use: No  . Drug use: No     Allergies   Bee venom; Keflex [cephalexin]; Tramadol; Gabapentin; Heparin; Hydrochlorothiazide; Hydrocodone; and Oxycodone-acetaminophen   Review of Systems Review of Systems  Constitutional: Negative for chills and fever.  Eyes: Negative for visual disturbance.  Respiratory: Negative for shortness of breath.   Cardiovascular: Negative for chest pain.    Gastrointestinal: Negative for abdominal pain, nausea and vomiting.  Genitourinary: Negative for decreased urine volume and frequency.       No changes in urination except stronger smell  Musculoskeletal: Positive for arthralgias and neck pain. Negative for back pain (nothing different than baseline).  Allergic/Immunologic: Positive for immunocompromised state (DM2).  Neurological: Negative for syncope, weakness and headaches.  Hematological: Bruises/bleeds easily (on xarelto).  Psychiatric/Behavioral: Negative for confusion.   All other systems reviewed and are negative for acute change except as noted in the HPI.    Physical Exam Updated Vital Signs BP (!) 185/107   Pulse 82   Temp 97.6 F (36.4 C) (Oral)   Resp 16   LMP 10/22/2016   SpO2 99%   Physical Exam  Constitutional: She is oriented to person, place, and time. Vital signs are normal. She appears well-developed and well-nourished.  Non-toxic appearance. No distress. Cervical collar in place.  Afebrile, nontoxic, NAD, in C-collar. BP 179/99 on exam  HENT:  Head: Normocephalic and atraumatic.  Mouth/Throat: Oropharynx is clear and moist and mucous membranes are normal.  Eyes: Conjunctivae and EOM are normal. Right eye exhibits no discharge. Left eye exhibits no discharge.  Neck: Spinous process tenderness and muscular tenderness present.  C-collar in place therefore ROM not assessed. Mild diffuse midline spinous process TTP, no bony stepoffs or deformities, with mild R sided paraspinous muscle TTP and slight muscle spasms. No bruising or swelling appreciated  Cardiovascular: Normal rate, regular rhythm, normal heart sounds and intact distal pulses. Exam reveals no gallop and no friction rub.  No murmur heard. Pulmonary/Chest: Effort normal and breath sounds normal. No respiratory distress. She has no decreased breath sounds. She has no wheezes. She has no rhonchi. She has no rales.  Abdominal: Soft. Normal appearance and  bowel sounds are normal. She exhibits no distension. There is no tenderness. There is no rigidity, no rebound, no guarding, no CVA tenderness, no tenderness at McBurney's point and negative Murphy's sign.  Musculoskeletal: Normal range of motion.       Right shoulder: She exhibits tenderness and bony tenderness. She exhibits normal range of motion, no swelling, no effusion, no crepitus and no deformity.  C-spine as above, all other spinal levels nonTTP without bony stepoffs or deformities although somewhat difficult to assess due to pt not being able to fully sit up/forward, but overall no focal areas of tenderness to T/L spine.  R shoulder with FROM intact, with mild  diffuse bony TTP, no swelling/effusion, no bruising or erythema, no warmth, no crepitus/deformity. Strength and sensation grossly intact in b/l upper extremities, and at baseline in b/l lower extremities; distal pulses intact in all extremities. Remote bruising to b/l lower extremities which pt states has been there for a while, no new/acute appearing bruises to exposed surfaces. Several small abrasions to b/l lower extremities, also which have been there for a while per pt; no drainage or warmth, mild erythema around the ones on her LLE but otherwise no evidence of superimposed infection.  No areas of tenderness to remainder of extremities other than what's mentioned above  Neurological: She is alert and oriented to person, place, and time. She has normal strength. No sensory deficit.  Skin: Skin is warm and dry. Abrasion and bruising noted. No rash noted.  Remote/healing bruising/abrasions as mentioned above  Psychiatric: She has a normal mood and affect.  Nursing note and vitals reviewed.    ED Treatments / Results  Labs (all labs ordered are listed, but only abnormal results are displayed) Labs Reviewed  CBC WITH DIFFERENTIAL/PLATELET - Abnormal; Notable for the following components:      Result Value   RBC 3.59 (*)     Hemoglobin 10.9 (*)    HCT 34.0 (*)    RDW 16.8 (*)    All other components within normal limits  BASIC METABOLIC PANEL - Abnormal; Notable for the following components:   Glucose, Bld 133 (*)    Calcium 8.4 (*)    All other components within normal limits  URINALYSIS, ROUTINE W REFLEX MICROSCOPIC - Abnormal; Notable for the following components:   APPearance TURBID (*)    Hgb urine dipstick SMALL (*)    Ketones, ur 5 (*)    Protein, ur 30 (*)    Nitrite POSITIVE (*)    Leukocytes, UA LARGE (*)    RBC / HPF >50 (*)    WBC, UA >50 (*)    Bacteria, UA RARE (*)    Non Squamous Epithelial 0-5 (*)    All other components within normal limits  URINE CULTURE    EKG None  Radiology Dg Shoulder Right  Result Date: 09/02/2017 CLINICAL DATA:  Pain following fall EXAM: RIGHT SHOULDER - 2+ VIEW COMPARISON:  None. FINDINGS: Frontal and Y scapular images were obtained. There is no appreciable fracture or dislocation. There is moderate generalized osteoarthritic change. No erosive change or intra-articular calcification. Visualized right lung is clear. Postoperative changes present in the upper thoracic region. 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 No fracture or dislocation. Electronically Signed   By: Lowella Grip III M.D.   On: 09/02/2017 14:04   Ct Head Wo Contrast  Result Date: 09/02/2017 CLINICAL DATA:  Status post fall moving from a bed to a wheelchair today. Initial encounter. EXAM: CT HEAD WITHOUT CONTRAST CT CERVICAL SPINE WITHOUT CONTRAST TECHNIQUE: Multidetector CT imaging of the head and cervical spine was performed following the standard protocol without intravenous contrast. Multiplanar CT image reconstructions of the cervical spine were also generated. COMPARISON:  Head CT scan 12/22/2016.  PET CT scan 05/20/2017. FINDINGS: CT HEAD FINDINGS Brain: No evidence of acute infarction, hemorrhage, hydrocephalus, extra-axial collection or mass  lesion/mass effect. Vascular: A few atherosclerotic calcifications are seen. Skull: Intact.  No focal lesion. Sinuses/Orbits: Mild mucosal thickening is noted in the maxillary sinuses, frontal sinuses, sphenoid sinus and scattered ethmoid air cells. Other: None. CT CERVICAL SPINE FINDINGS Alignment: Maintained. Skull base and vertebrae: No acute  fracture. No primary bone lesion or focal pathologic process. Soft tissues and spinal canal: No prevertebral fluid or swelling. No visible canal hematoma. Disc levels: Intervertebral disc space height is maintained. There is some endplate spurring most notable at C3-4 and C5-6. Partial visualization of thoracic fusion hardware noted. Upper chest: 0.5 cm left upper lobe pulmonary nodule is seen on prior PET CT scan is noted. Other: None. IMPRESSION: No acute abnormality head or cervical spine. Atherosclerosis. Sinus disease. Mild appearing cervical spondylosis. No change in 0.5 cm left upper lobe pulmonary nodule consistent with the patient's known metastatic thyroid carcinoma. Electronically Signed   By: Inge Rise M.D.   On: 09/02/2017 14:37   Ct Cervical Spine Wo Contrast  Result Date: 09/02/2017 CLINICAL DATA:  Status post fall moving from a bed to a wheelchair today. Initial encounter. EXAM: CT HEAD WITHOUT CONTRAST CT CERVICAL SPINE WITHOUT CONTRAST TECHNIQUE: Multidetector CT imaging of the head and cervical spine was performed following the standard protocol without intravenous contrast. Multiplanar CT image reconstructions of the cervical spine were also generated. COMPARISON:  Head CT scan 12/22/2016.  PET CT scan 05/20/2017. FINDINGS: CT HEAD FINDINGS Brain: No evidence of acute infarction, hemorrhage, hydrocephalus, extra-axial collection or mass lesion/mass effect. Vascular: A few atherosclerotic calcifications are seen. Skull: Intact.  No focal lesion. Sinuses/Orbits: Mild mucosal thickening is noted in the maxillary sinuses, frontal sinuses, sphenoid  sinus and scattered ethmoid air cells. Other: None. CT CERVICAL SPINE FINDINGS Alignment: Maintained. Skull base and vertebrae: No acute fracture. No primary bone lesion or focal pathologic process. Soft tissues and spinal canal: No prevertebral fluid or swelling. No visible canal hematoma. Disc levels: Intervertebral disc space height is maintained. There is some endplate spurring most notable at C3-4 and C5-6. Partial visualization of thoracic fusion hardware noted. Upper chest: 0.5 cm left upper lobe pulmonary nodule is seen on prior PET CT scan is noted. Other: None. IMPRESSION: No acute abnormality head or cervical spine. Atherosclerosis. Sinus disease. Mild appearing cervical spondylosis. No change in 0.5 cm left upper lobe pulmonary nodule consistent with the patient's known metastatic thyroid carcinoma. Electronically Signed   By: Inge Rise M.D.   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 T3 metastasis with history of prior decompression. EXAM: MRI THORACIC AND LUMBAR SPINE WITHOUT AND WITH CONTRAST TECHNIQUE: Multiplanar and multiecho pulse sequences of the thoracic and lumbar spine were obtained without and with intravenous contrast. CONTRAST:  60mL MULTIHANCE GADOBENATE DIMEGLUMINE 529 MG/ML IV SOLN COMPARISON:  Previous MRI from 12/25/2015. FINDINGS: MRI THORACIC SPINE FINDINGS Alignment: Examination mildly limited as the patient was unable to tolerate the full length of the exam. Postcontrast axial sequences as well as post-contrast imaging through the lumbar spine was not performed. Stable alignment with preservation of the normal thoracic kyphosis. No listhesis. Vertebrae: Postoperative changes from prior fusion from T1 through T5 for T3 decompression again seen. Pathologic fracture deformity of T3 is relatively  unchanged in appearance. Interval growth with progressive tumor seen within the adjacent spinal canal, extending from the mid aspect of T2 through the mid aspect of T4 (series 7, image 8). There has been marked interval worsening of cord and thecal sac progression, with the sac compressed laterally and slit like in appearance, measuring approximately 2 mm in transverse diameter at its most narrow point (series 10, image 12). Suspected  cord signal abnormality at this level, although poorly visualized due to susceptibility artifact from adjacent hardware. Postoperative fluid collection again noted at the adjacent laminectomy site, likely slightly decreased in size. No other metastatic lesions identified. Reactive marrow edema and enhancement about the right T12-L1 facet favored to be degenerative in nature (series 6, image 4). Vertebral body heights otherwise maintained. Mild discogenic reactive endplate changes present at the thoracolumbar junction. Cord:  No other cord signal abnormality identified. Paraspinal and other soft tissues: No other acute paraspinous soft tissue abnormality. Probable mild cholelithiasis noted. Visualized visceral structures otherwise unremarkable. Disc levels: T7-8: Central disc extrusion flattens the ventral thoracic spinal cord and resultant moderate spinal stenosis, stable. Left paracentral disc protrusion at T8-9 with mild flattening of the left hemi cord also unchanged. MRI LUMBAR SPINE FINDINGS Segmentation: Normal segmentation. Lowest well-formed disc labeled the L5-S1 level. Alignment: Levoscoliosis. Vertebral bodies otherwise normally aligned with preservation of the normal lumbar lordosis. No listhesis. Vertebrae: Mild height loss with reactive endplate edema at the left aspect of the superior endplate of L4, suspicious for mild compression fracture (series 14, image 12). No bony retropulsion. This is benign in appearance, with no underlying lesion identified. No worrisome  osseous lesions or evidence for metastatic disease identified within the lumbar spine. Prominent reactive marrow edema about the left L3-4 facet due to advanced facet degeneration. Conus medullaris: Extends to the L1 level and appears normal. Paraspinal and other soft tissues: Paraspinous soft tissues within normal limits. Visualized visceral structures unremarkable. Disc levels: L1-2:  Mild facet and ligament flavum hypertrophy.  No stenosis. L2-3: Minimal disc bulge with bilateral facet hypertrophy. No significant stenosis. L3-4: Disc desiccation with minimal disc bulge. Prominent anterior bridging osteophyte. Advanced bilateral facet degeneration with associated reactive marrow edema, left greater than right. 7 mm synovial cyst noted at the posterior aspect of the right L3-4 facet. Mild canal with bilateral L3 foraminal stenosis. L4-5: Chronic intervertebral disc mild diffuse disc bulge. Moderate to advanced facet arthrosis, left greater than right. Resultant moderate canal with bilateral subarticular stenosis. Foramina remain patent. L5-S1: Chronic intervertebral disc space narrowing with diffuse disc bulge. Left greater than right facet degeneration. Resultant moderate to severe left lateral recess narrowing. Mild right with mild to moderate left L5 foraminal stenosis. IMPRESSION: MRI THORACIC SPINE IMPRESSION 1. 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 Neuro surgical consultation recommended. 2. Superimposed sequelae of prior T1 through T5 fusion for T3 decompression. 3. Stable disc protrusions at T7-8 and T8-9. 4. Prominent facet arthropathy at T12-L1 on the right with associated reactive marrow edema. MRI LUMBAR SPINE IMPRESSION 1. No evidence for metastatic disease within the lumbar spine. 2. Mild acute to subacute endplate compression deformity involving the left aspect of the superior endplate of L4. This is  benign in appearance with no underlying pathologic lesion. 3. Advanced bilateral facet degeneration at L3-4 with associated reactive marrow edema, left greater than right. 4. Multilevel degenerative spondylolysis as above, most notable at L4-5 and L5-S1. Resultant moderate canal stenosis at L4-5, with moderate to severe left lateral recess narrowing at L5-S1. Critical Value/emergent results were called by telephone at the time of interpretation on 09/02/2017 at 8:30 pm to the ER PA Saucier , who verbally acknowledged these results. Electronically Signed   By: Jeannine Boga M.D.   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 T3 metastasis with history of prior decompression. EXAM: MRI THORACIC AND LUMBAR SPINE WITHOUT AND WITH CONTRAST TECHNIQUE: Multiplanar and multiecho pulse sequences of the thoracic and lumbar spine were obtained without and with intravenous contrast. CONTRAST:  43mL MULTIHANCE GADOBENATE DIMEGLUMINE 529 MG/ML IV SOLN COMPARISON:  Previous MRI from 12/25/2015. FINDINGS: MRI THORACIC SPINE FINDINGS Alignment: Examination mildly limited as the patient was unable to tolerate the full length of the exam. Postcontrast axial sequences as well as post-contrast imaging through the lumbar spine was not performed. Stable alignment with preservation of the normal thoracic kyphosis. No listhesis. Vertebrae: Postoperative changes from prior fusion from T1 through T5 for T3 decompression again seen. Pathologic fracture deformity of T3 is relatively unchanged in appearance. Interval growth with progressive tumor seen within the adjacent spinal canal, extending from the mid aspect of T2 through the mid aspect of T4 (series 7, image 8). There has been marked interval worsening of cord and thecal sac progression, with  the sac compressed laterally and slit like in appearance, measuring approximately 2 mm in transverse diameter at its most narrow point (series 10, image 12). Suspected cord signal abnormality at this level, although poorly visualized due to susceptibility artifact from adjacent hardware. Postoperative fluid collection again noted at the adjacent laminectomy site, likely slightly decreased in size. No other metastatic lesions identified. Reactive marrow edema and enhancement about the right T12-L1 facet favored to be degenerative in nature (series 6, image 4). Vertebral body heights otherwise maintained. Mild discogenic reactive endplate changes present at the thoracolumbar junction. Cord:  No other cord signal abnormality identified. Paraspinal and other soft tissues: No other acute paraspinous soft tissue abnormality. Probable mild cholelithiasis noted. Visualized visceral structures otherwise unremarkable. Disc levels: T7-8: Central disc extrusion flattens the ventral thoracic spinal cord and resultant moderate spinal stenosis, stable. Left paracentral disc protrusion at T8-9 with mild flattening of the left hemi cord also unchanged. MRI LUMBAR SPINE FINDINGS Segmentation: Normal segmentation. Lowest well-formed disc labeled the L5-S1 level. Alignment: Levoscoliosis. Vertebral bodies otherwise normally aligned with preservation of the normal lumbar lordosis. No listhesis. Vertebrae: Mild height loss with reactive endplate edema at the left aspect of the superior endplate of L4, suspicious for mild compression fracture (series 14, image 12). No bony retropulsion. This is benign in appearance, with no underlying lesion identified. No worrisome osseous lesions or evidence for metastatic disease identified within the lumbar spine. Prominent reactive marrow edema about the left L3-4 facet due to advanced facet degeneration. Conus medullaris: Extends to the L1 level and appears normal. Paraspinal and other soft tissues:  Paraspinous soft tissues within normal limits. Visualized visceral structures unremarkable. Disc levels: L1-2:  Mild facet and ligament flavum hypertrophy.  No stenosis. L2-3: Minimal disc bulge with bilateral facet hypertrophy. No significant stenosis. L3-4: Disc desiccation with minimal disc bulge. Prominent anterior bridging osteophyte. Advanced bilateral facet degeneration with associated reactive marrow edema, left greater than right. 7 mm synovial cyst noted at the posterior aspect of the right L3-4 facet. Mild canal with bilateral L3 foraminal stenosis. L4-5: Chronic intervertebral disc mild diffuse disc bulge. Moderate to advanced facet arthrosis, left greater than right. Resultant moderate canal with bilateral subarticular stenosis. Foramina remain patent. L5-S1: Chronic intervertebral disc space narrowing with diffuse disc bulge. Left greater than right facet degeneration. Resultant moderate to severe left lateral recess narrowing. Mild right with mild to moderate left L5 foraminal stenosis. IMPRESSION: MRI THORACIC  SPINE IMPRESSION 1. 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 Neuro surgical consultation recommended. 2. Superimposed sequelae of prior T1 through T5 fusion for T3 decompression. 3. Stable disc protrusions at T7-8 and T8-9. 4. Prominent facet arthropathy at T12-L1 on the right with associated reactive marrow edema. MRI LUMBAR SPINE IMPRESSION 1. No evidence for metastatic disease within the lumbar spine. 2. Mild acute to subacute endplate compression deformity involving the left aspect of the superior endplate of L4. This is benign in appearance with no underlying pathologic lesion. 3. Advanced bilateral facet degeneration at L3-4 with associated reactive marrow edema, left greater than right. 4. Multilevel degenerative spondylolysis as above, most notable at L4-5 and L5-S1. Resultant moderate canal  stenosis at L4-5, with moderate to severe left lateral recess narrowing at L5-S1. Critical Value/emergent results were called by telephone at the time of interpretation on 09/02/2017 at 8:30 pm to the ER PA Merrillan , who verbally acknowledged these results. Electronically Signed   By: Jeannine Boga M.D.   On: 09/02/2017 20:31    Procedures Procedures (including critical care time)  Medications Ordered in ED Medications  dexamethasone (DECADRON) injection 4 mg (has no administration in time range)  ondansetron (ZOFRAN-ODT) disintegrating tablet 4 mg (4 mg Oral Given 09/02/17 1426)  ondansetron (ZOFRAN) injection 4 mg (4 mg Intravenous Given 09/02/17 1500)  sodium chloride 0.9 % bolus 1,000 mL (0 mLs Intravenous Stopped 09/02/17 1707)  acetaminophen (TYLENOL) tablet 650 mg (650 mg Oral Given 09/02/17 1536)  LORazepam (ATIVAN) injection 0.5 mg (0.5 mg Intravenous Given 09/02/17 1719)  gadobenate dimeglumine (MULTIHANCE) injection 20 mL (20 mLs Intravenous Contrast Given 09/02/17 1909)  nitrofurantoin (macrocrystal-monohydrate) (MACROBID) capsule 100 mg (100 mg Oral Given 09/02/17 2121)     Initial Impression / Assessment and Plan / ED Course  I have reviewed the triage vital signs and the nursing notes.  Pertinent labs & imaging results that were available during my care of the patient were reviewed by me and considered in my medical decision making (see chart for details).     52 y.o. female here with mechanical fall while trying to transfer from bed to wheelchair. Has been having progressively worsening weakness in legs x2 wks, was scheduled for MRI T/L spine yesterday but couldn't make it here to get it done. After fall, denies worsening back pain from usual, but c/o neck pain and R shoulder pain. On exam, diffuse R shoulder TTP and mild cervical spine TTP, no bony stepoffs or deformities appreciated, strength and sensation grossly intact in b/l upper extremities, strength/sensation  diminished in lower extremities which is baseline; no focal midline spinal TTP in remainder of spine, although exam is limited due to pt not being able to fully sit up. B/l extremities have healing bruises and small abrasions which she states have been there since her last hospital stay 2wks ago; no evidence of superimposed infection although left leg abrasions have some slight erythema around them but aren't warm to touch or having any drainage. Pulses intact in all extremities. Will get CT head/neck to be cautious given the fall, and R shoulder xray; will discuss with my attending Dr. Kathrynn Humble to see if we could potentially go ahead and get the MRI T/L spine since she's here. Pt declines wanting anything for pain. Of note, BP elevated in 170s/90s, but pt hasn't taken her BP meds today; no symptoms related to this currently, doubt need for  emergent work up of this at this time. Will reassess shortly.   2:23 PM Discussed with Dr. Kathrynn Humble, who agrees with proceeding with MRI imaging since she's already here and has mobility issues. Called MRI who requested that we put the order in STAT so they can do it today. Reordered these now. R shoulder xray showing moderate OA changes but no fx/dislocation noted. CT head/neck pending. Will order labs since we're getting MRI imaging. Pt requesting nausea meds before MRI, will give those now and reassess shortly.   3:01 PM CT head/neck negative for acute findings. Pt vomited in MRI, so she was sent back; now getting IV established, pt states she hasn't eaten since yesterday and thinks that's why she vomited; will give IV zofran, fluids, and then if feeling better she can eat/drink something to get something in her stomach. She's also requesting tylenol for her pain, will give this once she's not nauseated. Will reassess shortly.   8:40 PM CBC w/diff with chronic stable anemia. BMP fairly unremarkable. U/A with +nitrite, +leuks, 11-20 squamous, 0-5 nonsquamous epithelial  cells, +WBC clumps, >50 RBCs and WBCs however rare bacteria; will treat for UTI given the multitude of findings suggestive of this, will send for UCx. MRI T spine showing interval worsening of mets around T3 with worsened mass effect on thecal sac and cord compression; superimposed sequelae of prior T1-5 fusion, and other stable findings. MRI L spine without evidence of mets there, mild acute/subacute endplate compression deformity of L superior endplate of L4, bening in appearance, also with advance b/l facet degeneration L3-4 with associated marrow edema L>R, and multilevel degenerative spondylosis most notably at L4-5/L5-S1 with resultant canal stenosis at L4-5 and L lateral recess narrowing at L5-S1. I somewhat anticipated these findings given her worsening LE weakness in the last several weeks. Will consult neurosurgeon. Of note, BP improving. Pt feeling well currently and tolerating PO well. Will discuss case with NSG and reassess shortly.   8:49 PM Margo Aye NP of neurosurgery returning page, states she'll review the images and call me back. Will await further instructions.   9:42 PM Dr. Ronnald Ramp down to see pt, doesn't feel she is a surgical candidate, potentially could benefit from radiation. Recommends decadron 4mg  IV q6h, hospitalist admission, and rad/onc consult in the morning to see if there is any role for radiation. He is fine with her staying here. He'll let Dr. Christella Noa know about pt's status and hopefully he'll be able to also f/up with her while she's here. Will proceed with admission.   10:04 PM Dr. Denton Brick of [facility] returning page and will admit. Holding orders to be placed by admitting team. Please see their notes for further documentation of care. I appreciate their help with this pleasant pt's care. Pt stable at time of admission.    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

## 2017-09-02 NOTE — ED Notes (Signed)
PA made aware of delay in lab collection as patient is in MRI at this time.

## 2017-09-02 NOTE — Progress Notes (Signed)
[place] CSW Progress Note  Request received from Ardyth Harps, RN - patient called to cancel medical test and MD appt due to inability to get into vehicle.  RN asked for CSW to investigate options for patient.  CSW reviewed chart, noted patient is currently in [facility].  CSW spoke w CSW Melina Fiddler to convey patient concern re transportation.  Patient currently uses wheelchair.  Noted patient's insurance has a transportation benefit w [employer], can provide wheelchair Lucianne Lei for transport for up to 24 trips/year (12 one way trips) with a 45 mile radius of patient's home address.  Reservations must be made at least 72 hours in advance and can be scheduled up to two weeks in advance.  [employer] will need information from patient re weight, ability to transfer and similar issues.  K Swindle and E Davenport advised of this transportation option.   Edwyna Shell, LCSW Clinical Social Worker Phone:  [phone]

## 2017-09-02 NOTE — ED Notes (Signed)
ED TO INPATIENT HANDOFF REPORT  Name/Age/Gender Sarah [name] 52 y.o. female  Code Status Code Status History    Date Active Date Inactive Code Status Order ID Comments User Context   08/09/2017 1649 08/13/2017 1743 Full Code [id number]  Barton Dubois, MD Inpatient   07/11/2015 1726 08/01/2015 1545 Full Code [id number]  Bary Leriche, PA-C Inpatient   07/11/2015 1726 07/11/2015 1726 Full Code [id number]  Bary Leriche, PA-C Inpatient   07/06/2015 1005 07/11/2015 1726 Full Code [id number]  Chesley Mires, MD Inpatient   01/19/2014 1744 02/10/2014 1353 Full Code [id number]  Flora Lipps Inpatient   01/09/2014 1925 01/19/2014 1744 Full Code [id number]  Izora Gala, MD Inpatient   12/30/2013 2212 01/09/2014 1925 Full Code [id number]  Greggory Keen, MD Inpatient   12/15/2013 0304 12/30/2013 2212 Full Code [id number]  Ashok Pall, MD Inpatient   12/13/2013 0350 12/15/2013 0304 Full Code [id number]  Hilton Sinclair, MD Inpatient   12/10/2013 1343 12/12/2013 2118 Full Code [id number]  Bernadene Bell, MD Inpatient      Home/SNF/Other Home  Chief Complaint fall   Level of Care/Admitting Diagnosis ED Disposition    ED Disposition Condition [facility] Area: [facility] [[id number]]  Level of Care: Med-Surg [16]  Diagnosis: Metastatic disease [facility]) [[id number]]  Admitting Physician: Bethena Roys [phone]  Attending Physician: Bethena Roys [phone]  Estimated length of stay: past midnight tomorrow  Certification:: I certify this patient will need inpatient services for at least 2 midnights  PT Class (Do Not Modify): Inpatient [101]  PT Acc Code (Do Not Modify): Private [1]       Medical History Past Medical History:  Diagnosis Date  . Cancer 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 Chronic back pain   . DDD (degenerative disc disease), cervical   . DJD (degenerative joint disease)   . History of rectal fissure    . HIT (heparin-induced thrombocytopenia) ([place])   . Hypertension   . Obesity   . Paraplegia at T4 level ([place])   . Pulmonary embolus, right ([place]) 2015  . Rotator cuff tendonitis right    Allergies Allergies  Allergen Reactions  . Bee Venom Anaphylaxis  . Keflex [Cephalexin] Nausea And Vomiting  . Tramadol Nausea And Vomiting  . Gabapentin Nausea And Vomiting  . Heparin Other (See Comments)    Weak positive platelets induced antibodies, SRA negative--2015  . Hydrochlorothiazide Nausea And Vomiting  . Hydrocodone Nausea And Vomiting  . Oxycodone-Acetaminophen Nausea And Vomiting    IV Location/Drains/Wounds Patient Lines/Drains/Airways Status   Active Line/Drains/Airways    Name:   Placement date:   Placement time:   Site:   Days:   Peripheral IV 08/09/17 Right Hand   08/09/17    0951    Hand   24   Peripheral IV 09/02/17 Left Antecubital   09/02/17    1459    Antecubital   less than 1   External Urinary Catheter   08/10/17    1914    -   23          Labs/Imaging Results for orders placed or performed during the hospital encounter of 09/02/17 (from the past 48 hour(s))  CBC with Differential     Status: Abnormal   Collection Time: 09/02/17  3:06 PM  Result Value Ref Range   WBC 5.8 4.0 - 10.5 K/uL   RBC 3.59 (L) 3.87 - 5.11  MIL/uL   Hemoglobin 10.9 (L) 12.0 - 15.0 g/dL   HCT 34.0 (L) 36.0 - 46.0 %   MCV 94.7 78.0 - 100.0 fL   MCH 30.4 26.0 - 34.0 pg   MCHC 32.1 30.0 - 36.0 g/dL   RDW 16.8 (H) 11.5 - 15.5 %   Platelets 190 150 - 400 K/uL   Neutrophils Relative % 81 %   Neutro Abs 4.6 1.7 - 7.7 K/uL   Lymphocytes Relative 13 %   Lymphs Abs 0.7 0.7 - 4.0 K/uL   Monocytes Relative 6 %   Monocytes Absolute 0.4 0.1 - 1.0 K/uL   Eosinophils Relative 0 %   Eosinophils Absolute 0.0 0.0 - 0.7 K/uL   Basophils Relative 0 %   Basophils Absolute 0.0 0.0 - 0.1 K/uL    Comment: Performed at [facility], [place] [address] [place], [place] [address]  Basic  metabolic panel     Status: Abnormal   Collection Time: 09/02/17  3:06 PM  Result Value Ref Range   Sodium 141 135 - 145 mmol/L   Potassium 4.0 3.5 - 5.1 mmol/L   Chloride 105 101 - 111 mmol/L   CO2 27 22 - 32 mmol/L   Glucose, Bld 133 (H) 65 - 99 mg/dL   BUN 17 6 - 20 mg/dL   Creatinine, Ser 0.98 0.44 - 1.00 mg/dL   Calcium 8.4 (L) 8.9 - 10.3 mg/dL   GFR calc non Af Amer >60 >60 mL/min   GFR calc Af Amer >60 >60 mL/min    Comment: (NOTE) The eGFR has been calculated using the CKD EPI equation. This calculation has not been validated in all clinical situations. eGFR's persistently <60 mL/min signify possible Chronic Kidney Disease.    Anion gap 9 5 - 15    Comment: Performed at [facility], [place] [address] [place], [place] [address]  Urinalysis, Routine w reflex microscopic     Status: Abnormal   Collection Time: 09/02/17  4:46 PM  Result Value Ref Range   Color, Urine YELLOW YELLOW   APPearance TURBID (A) CLEAR   Specific Gravity, Urine 1.013 1.005 - 1.030   pH 5.0 5.0 - 8.0   Glucose, UA NEGATIVE NEGATIVE mg/dL   Hgb urine dipstick SMALL (A) NEGATIVE   Bilirubin Urine NEGATIVE NEGATIVE   Ketones, ur 5 (A) NEGATIVE mg/dL   Protein, ur 30 (A) NEGATIVE mg/dL   Nitrite POSITIVE (A) NEGATIVE   Leukocytes, UA LARGE (A) NEGATIVE   RBC / HPF >50 (H) 0 - 5 RBC/hpf   WBC, UA >50 (H) 0 - 5 WBC/hpf   Bacteria, UA RARE (A) NONE SEEN   Squamous Epithelial / LPF 11-20 0 - 5   WBC Clumps PRESENT    Non Squamous Epithelial 0-5 (A) NONE SEEN    Comment: Performed at [facility], [place] [address] [place], [place] [address]   Dg Shoulder Right  Result Date: 09/02/2017 CLINICAL DATA:  Pain following fall EXAM: RIGHT SHOULDER - 2+ VIEW COMPARISON:  None. FINDINGS: Frontal and Y scapular images were obtained. There is no appreciable fracture or dislocation. There is moderate generalized osteoarthritic change. No erosive change or intra-articular  calcification. Visualized right lung is clear. Postoperative changes present in the upper thoracic region. 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 No fracture or dislocation. Electronically Signed   By: Lowella Grip III M.D.   On: 09/02/2017 14:04   Ct Head Wo Contrast  Result Date:  09/02/2017 CLINICAL DATA:  Status post fall moving from a bed to a wheelchair today. Initial encounter. EXAM: CT HEAD WITHOUT CONTRAST CT CERVICAL SPINE WITHOUT CONTRAST TECHNIQUE: Multidetector CT imaging of the head and cervical spine was performed following the standard protocol without intravenous contrast. Multiplanar CT image reconstructions of the cervical spine were also generated. COMPARISON:  Head CT scan 12/22/2016.  PET CT scan 05/20/2017. FINDINGS: CT HEAD FINDINGS Brain: No evidence of acute infarction, hemorrhage, hydrocephalus, extra-axial collection or mass lesion/mass effect. Vascular: A few atherosclerotic calcifications are seen. Skull: Intact.  No focal lesion. Sinuses/Orbits: Mild mucosal thickening is noted in the maxillary sinuses, frontal sinuses, sphenoid sinus and scattered ethmoid air cells. Other: None. CT CERVICAL SPINE FINDINGS Alignment: Maintained. Skull base and vertebrae: No acute fracture. No primary bone lesion or focal pathologic process. Soft tissues and spinal canal: No prevertebral fluid or swelling. No visible canal hematoma. Disc levels: Intervertebral disc space height is maintained. There is some endplate spurring most notable at C3-4 and C5-6. Partial visualization of thoracic fusion hardware noted. Upper chest: 0.5 cm left upper lobe pulmonary nodule is seen on prior PET CT scan is noted. Other: None. IMPRESSION: No acute abnormality head or cervical spine. Atherosclerosis. Sinus disease. Mild appearing cervical spondylosis. No change in 0.5 cm left upper lobe pulmonary nodule consistent with the patient's known metastatic thyroid carcinoma.  Electronically Signed   By: Inge Rise M.D.   On: 09/02/2017 14:37   Ct Cervical Spine Wo Contrast  Result Date: 09/02/2017 CLINICAL DATA:  Status post fall moving from a bed to a wheelchair today. Initial encounter. EXAM: CT HEAD WITHOUT CONTRAST CT CERVICAL SPINE WITHOUT CONTRAST TECHNIQUE: Multidetector CT imaging of the head and cervical spine was performed following the standard protocol without intravenous contrast. Multiplanar CT image reconstructions of the cervical spine were also generated. COMPARISON:  Head CT scan 12/22/2016.  PET CT scan 05/20/2017. FINDINGS: CT HEAD FINDINGS Brain: No evidence of acute infarction, hemorrhage, hydrocephalus, extra-axial collection or mass lesion/mass effect. Vascular: A few atherosclerotic calcifications are seen. Skull: Intact.  No focal lesion. Sinuses/Orbits: Mild mucosal thickening is noted in the maxillary sinuses, frontal sinuses, sphenoid sinus and scattered ethmoid air cells. Other: None. CT CERVICAL SPINE FINDINGS Alignment: Maintained. Skull base and vertebrae: No acute fracture. No primary bone lesion or focal pathologic process. Soft tissues and spinal canal: No prevertebral fluid or swelling. No visible canal hematoma. Disc levels: Intervertebral disc space height is maintained. There is some endplate spurring most notable at C3-4 and C5-6. Partial visualization of thoracic fusion hardware noted. Upper chest: 0.5 cm left upper lobe pulmonary nodule is seen on prior PET CT scan is noted. Other: None. IMPRESSION: No acute abnormality head or cervical spine. Atherosclerosis. Sinus disease. Mild appearing cervical spondylosis. No change in 0.5 cm left upper lobe pulmonary nodule consistent with the patient's known metastatic thyroid carcinoma. Electronically Signed   By: Inge Rise M.D.   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 EXAM: MRI THORACIC AND LUMBAR SPINE WITHOUT AND WITH CONTRAST TECHNIQUE: Multiplanar and multiecho pulse sequences of the thoracic and lumbar spine were obtained without and with intravenous contrast. CONTRAST:  36m MULTIHANCE GADOBENATE DIMEGLUMINE 529 MG/ML IV SOLN COMPARISON:  Previous MRI from 12/25/2015. FINDINGS: MRI THORACIC SPINE FINDINGS Alignment: Examination mildly  limited as the patient was unable to tolerate the full length of the exam. Postcontrast axial sequences as well as post-contrast imaging through the lumbar spine was not performed. Stable alignment with preservation of the normal thoracic kyphosis. No listhesis. Vertebrae: Postoperative changes from prior fusion from T1 through T5 for T3 decompression again seen. Pathologic fracture deformity of T3 is relatively unchanged in appearance. Interval growth with progressive tumor seen within the adjacent spinal canal, extending from the mid aspect of T2 through the mid aspect of T4 (series 7, image 8). There has been marked interval worsening of cord and thecal sac progression, with the sac compressed laterally and slit like in appearance, measuring approximately 2 mm in transverse diameter at its most narrow point (series 10, image 12). Suspected cord signal abnormality at this level, although poorly visualized due to susceptibility artifact from adjacent hardware. Postoperative fluid collection again noted at the adjacent laminectomy site, likely slightly decreased in size. No other metastatic lesions identified. Reactive marrow edema and enhancement about the right T12-L1 facet favored to be degenerative in nature (series 6, image 4). Vertebral body heights otherwise maintained. Mild discogenic reactive endplate changes present at the thoracolumbar junction. Cord:  No other cord signal abnormality identified. Paraspinal and  other soft tissues: No other acute paraspinous soft tissue abnormality. Probable mild cholelithiasis noted. Visualized visceral structures otherwise unremarkable. Disc levels: T7-8: Central disc extrusion flattens the ventral thoracic spinal cord and resultant moderate spinal stenosis, stable. Left paracentral disc protrusion at T8-9 with mild flattening of the left hemi cord also unchanged. MRI LUMBAR SPINE FINDINGS Segmentation: Normal segmentation. Lowest well-formed disc labeled the L5-S1 level. Alignment: Levoscoliosis. Vertebral bodies otherwise normally aligned with preservation of the normal lumbar lordosis. No listhesis. Vertebrae: Mild height loss with reactive endplate edema at the left aspect of the superior endplate of L4, suspicious for mild compression fracture (series 14, image 12). No bony retropulsion. This is benign in appearance, with no underlying lesion identified. No worrisome osseous lesions or evidence for metastatic disease identified within the lumbar spine. Prominent reactive marrow edema about the left L3-4 facet due to advanced facet degeneration. Conus medullaris: Extends to the L1 level and appears normal. Paraspinal and other soft tissues: Paraspinous soft tissues within normal limits. Visualized visceral structures unremarkable. Disc levels: L1-2:  Mild facet and ligament flavum hypertrophy.  No stenosis. L2-3: Minimal disc bulge with bilateral facet hypertrophy. No significant stenosis. L3-4: Disc desiccation with minimal disc bulge. Prominent anterior bridging osteophyte. Advanced bilateral facet degeneration with associated reactive marrow edema, left greater than right. 7 mm synovial cyst noted at the posterior aspect of the right L3-4 facet. Mild canal with bilateral L3 foraminal stenosis. L4-5: Chronic intervertebral disc mild diffuse disc bulge. Moderate to advanced facet arthrosis, left greater than right. Resultant moderate canal with bilateral subarticular stenosis.  Foramina remain patent. L5-S1: Chronic intervertebral disc space narrowing with diffuse disc bulge. Left greater than right facet degeneration. Resultant moderate to severe left lateral recess narrowing. Mild right with mild to moderate left L5 foraminal stenosis. IMPRESSION: MRI THORACIC SPINE IMPRESSION 1. 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 Neuro surgical consultation recommended. 2. Superimposed sequelae of prior T1 through T5 fusion for T3 decompression. 3. Stable disc protrusions at T7-8 and T8-9. 4. Prominent facet arthropathy at T12-L1 on the right with associated reactive marrow edema. MRI LUMBAR SPINE IMPRESSION 1. No evidence for metastatic disease within the lumbar spine.  2. Mild acute to subacute endplate compression deformity involving the left aspect of the superior endplate of L4. This is benign in appearance with no underlying pathologic lesion. 3. Advanced bilateral facet degeneration at L3-4 with associated reactive marrow edema, left greater than right. 4. Multilevel degenerative spondylolysis as above, most notable at L4-5 and L5-S1. Resultant moderate canal stenosis at L4-5, with moderate to severe left lateral recess narrowing at L5-S1. Critical Value/emergent results were called by telephone at the time of interpretation on 09/02/2017 at 8:30 pm to the ER PA [place] , who verbally acknowledged these results. Electronically Signed   By: Jeannine Boga M.D.   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 EXAM: MRI THORACIC AND LUMBAR SPINE WITHOUT AND WITH CONTRAST TECHNIQUE: Multiplanar and multiecho pulse sequences of the thoracic and lumbar spine were  obtained without and with intravenous contrast. CONTRAST:  73m MULTIHANCE GADOBENATE DIMEGLUMINE 529 MG/ML IV SOLN COMPARISON:  Previous MRI from 12/25/2015. FINDINGS: MRI THORACIC SPINE FINDINGS Alignment: Examination mildly limited as the patient was unable to tolerate the full length of the exam. Postcontrast axial sequences as well as post-contrast imaging through the lumbar spine was not performed. Stable alignment with preservation of the normal thoracic kyphosis. No listhesis. Vertebrae: Postoperative changes from prior fusion from T1 through T5 for T3 decompression again seen. Pathologic fracture deformity of T3 is relatively unchanged in appearance. Interval growth with progressive tumor seen within the adjacent spinal canal, extending from the mid aspect of T2 through the mid aspect of T4 (series 7, image 8). There has been marked interval worsening of cord and thecal sac progression, with the sac compressed laterally and slit like in appearance, measuring approximately 2 mm in transverse diameter at its most narrow point (series 10, image 12). Suspected cord signal abnormality at this level, although poorly visualized due to susceptibility artifact from adjacent hardware. Postoperative fluid collection again noted at the adjacent laminectomy site, likely slightly decreased in size. No other metastatic lesions identified. Reactive marrow edema and enhancement about the right T12-L1 facet favored to be degenerative in nature (series 6, image 4). Vertebral body heights otherwise maintained. Mild discogenic reactive endplate changes present at the thoracolumbar junction. Cord:  No other cord signal abnormality identified. Paraspinal and other soft tissues: No other acute paraspinous soft tissue abnormality. Probable mild cholelithiasis noted. Visualized visceral structures otherwise unremarkable. Disc levels: T7-8: Central disc extrusion flattens the ventral thoracic spinal cord and resultant moderate spinal  stenosis, stable. Left paracentral disc protrusion at T8-9 with mild flattening of the left hemi cord also unchanged. MRI LUMBAR SPINE FINDINGS Segmentation: Normal segmentation. Lowest well-formed disc labeled the L5-S1 level. Alignment: Levoscoliosis. Vertebral bodies otherwise normally aligned with preservation of the normal lumbar lordosis. No listhesis. Vertebrae: Mild height loss with reactive endplate edema at the left aspect of the superior endplate of L4, suspicious for mild compression fracture (series 14, image 12). No bony retropulsion. This is benign in appearance, with no underlying lesion identified. No worrisome osseous lesions or evidence for metastatic disease identified within the lumbar spine. Prominent reactive marrow edema about the left L3-4 facet due to advanced facet degeneration. Conus medullaris: Extends to the L1 level and appears normal. Paraspinal and other soft tissues: Paraspinous soft tissues within normal limits. Visualized visceral structures unremarkable. Disc  levels: L1-2:  Mild facet and ligament flavum hypertrophy.  No stenosis. L2-3: Minimal disc bulge with bilateral facet hypertrophy. No significant stenosis. L3-4: Disc desiccation with minimal disc bulge. Prominent anterior bridging osteophyte. Advanced bilateral facet degeneration with associated reactive marrow edema, left greater than right. 7 mm synovial cyst noted at the posterior aspect of the right L3-4 facet. Mild canal with bilateral L3 foraminal stenosis. L4-5: Chronic intervertebral disc mild diffuse disc bulge. Moderate to advanced facet arthrosis, left greater than right. Resultant moderate canal with bilateral subarticular stenosis. Foramina remain patent. L5-S1: Chronic intervertebral disc space narrowing with diffuse disc bulge. Left greater than right facet degeneration. Resultant moderate to severe left lateral recess narrowing. Mild right with mild to moderate left L5 foraminal stenosis. IMPRESSION: MRI  THORACIC SPINE IMPRESSION 1. 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 Neuro surgical consultation recommended. 2. Superimposed sequelae of prior T1 through T5 fusion for T3 decompression. 3. Stable disc protrusions at T7-8 and T8-9. 4. Prominent facet arthropathy at T12-L1 on the right with associated reactive marrow edema. MRI LUMBAR SPINE IMPRESSION 1. No evidence for metastatic disease within the lumbar spine. 2. Mild acute to subacute endplate compression deformity involving the left aspect of the superior endplate of L4. This is benign in appearance with no underlying pathologic lesion. 3. Advanced bilateral facet degeneration at L3-4 with associated reactive marrow edema, left greater than right. 4. Multilevel degenerative spondylolysis as above, most notable at L4-5 and L5-S1. Resultant moderate canal stenosis at L4-5, with moderate to severe left lateral recess narrowing at L5-S1. Critical Value/emergent results were called by telephone at the time of interpretation on 09/02/2017 at 8:30 pm to the ER PA [place] , who verbally acknowledged these results. Electronically Signed   By: Jeannine Boga M.D.   On: 09/02/2017 20:31    Pending Labs Unresulted Labs (From admission, onward)   Start     Ordered   09/02/17 1730  Urine culture  Add-on,   STAT     09/02/17 1729      Vitals/Pain Today's Vitals   09/02/17 2006 09/02/17 2030 09/02/17 2100 09/02/17 2130  BP: (!) 163/103 (!) 178/111 (!) 185/116 (!) 177/103  Pulse: (!) 104 (!) 101 (!) 103 (!) 104  Resp: (!) 26 (!) 21 (!) 23 (!) 29  Temp:      TempSrc:      SpO2: 100% 100% 100% 100%  PainSc:        Isolation Precautions No active isolations  Medications Medications  dexamethasone (DECADRON) injection 4 mg (has no administration in time range)  ondansetron (ZOFRAN-ODT) disintegrating tablet 4 mg (4 mg Oral Given 09/02/17 1426)   ondansetron (ZOFRAN) injection 4 mg (4 mg Intravenous Given 09/02/17 1500)  sodium chloride 0.9 % bolus 1,000 mL (0 mLs Intravenous Stopped 09/02/17 1707)  acetaminophen (TYLENOL) tablet 650 mg (650 mg Oral Given 09/02/17 1536)  LORazepam (ATIVAN) injection 0.5 mg (0.5 mg Intravenous Given 09/02/17 1719)  gadobenate dimeglumine (MULTIHANCE) injection 20 mL (20 mLs Intravenous Contrast Given 09/02/17 1909)  nitrofurantoin (macrocrystal-monohydrate) (MACROBID) capsule 100 mg (100 mg Oral Given 09/02/17 2121)    Mobility non-ambulatory

## 2017-09-02 NOTE — ED Notes (Signed)
Family at bedside. 

## 2017-09-02 NOTE — Consult Note (Signed)
Reason for Consult:T3 mass Referring Physician: EDP  Sarah [name] is an 52 y.o. female.   HPI:  52 year old female who is a patient of Dr. Hewitt Shorts who's had 2 previous surgeries at T3 and 2015 2017, the last one being a repeat partial decompression followed by instrumented fusion T1-T5. She was admitted to the hospital about 2 and half weeks ago with nausea and vomiting. Upon discharge she noted weakness in her legs. She states 2 weeks ago she could slightly invert at the pelvis, she has not stood or walked in 2-1/2 weeks. She fell earlier today. She has been on xarelto, stopped that on Sunday. She describes stable chronic tingling in her legs.she has some back pain.  Past Medical History:  Diagnosis Date  . 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 Chronic back pain   . DDD (degenerative disc disease), cervical   . DJD (degenerative joint disease)   . History of rectal fissure   . HIT (heparin-induced thrombocytopenia) ([place])   . Hypertension   . Obesity   . Paraplegia at T4 level ([place])   . Pulmonary embolus, right ([place]) 2015  . Rotator cuff tendonitis right    Past Surgical History:  Procedure Laterality Date  . LAMINECTOMY N/A 12/14/2013   Procedure: THORACIC LAMINECTOMY FOR TUMOR THORACIC THREE;  Surgeon: Ashok Pall, MD;  Location: [place] NEURO ORS;  Service: Neurosurgery;  Laterality: N/A;  THORACIC LAMINECTOMY FOR TUMOR THORACIC THREE  . LAMINECTOMY N/A 07/05/2015   Procedure: THORACIC LAMINECTOMY FOR TUMOR;  Surgeon: Ashok Pall, MD;  Location: [place] NEURO ORS;  Service: Neurosurgery;  Laterality: N/A;  . POSTERIOR LUMBAR FUSION 4 LEVEL N/A 12/30/2013   Procedure: Thoracic one-Thoracic five posterior thoracic fusion with pedicle screws;  Surgeon: Ashok Pall, MD;  Location: [place] NEURO ORS;  Service: Neurosurgery;  Laterality: N/A;  Thoracic one-Thoracic five posterior thoracic fusion with pedicle screws  . THYROIDECTOMY N/A 01/09/2014    Procedure: TOTAL THYROIDECTOMY;  Surgeon: Izora Gala, MD;  Location: [place];  Service: ENT;  Laterality: N/A;  . TONSILLECTOMY      Allergies  Allergen Reactions  . Bee Venom Anaphylaxis  . Keflex [Cephalexin] Nausea And Vomiting  . Tramadol Nausea And Vomiting  . Gabapentin Nausea And Vomiting  . Heparin Other (See Comments)    Weak positive platelets induced antibodies, SRA negative--2015  . Hydrochlorothiazide Nausea And Vomiting  . Hydrocodone Nausea And Vomiting  . Oxycodone-Acetaminophen Nausea And Vomiting    Social History   Tobacco Use  . Smoking status: Never Smoker  . Smokeless tobacco: Never Used  Substance Use Topics  . Alcohol use: No    Family History  Problem Relation Age of Onset  . Hypertension Mother   . Diabetes Mother   . Hypertension Father   . Stroke Father   . Diabetes Father   . Diabetes Sister   . Diabetes Sister      Review of Systems  Positive ROS: as above  All other systems have been reviewed and were otherwise negative with the exception of those mentioned in the HPI and as above.  Objective: Vital signs in last 24 hours: Temp:  [97.6 F (36.4 C)] 97.6 F (36.4 C) (06/12 1254) Pulse Rate:  [74-107] 103 (06/12 2100) Resp:  [15-26] 23 (06/12 2100) BP: (151-185)/(95-116) 185/116 (06/12 2100) SpO2:  [99 %-100 %] 100 % (06/12 2100)  General Appearance: Alert, cooperative, no distress, appears stated age Head: Normocephalic, without obvious abnormality, atraumatic  Eyes: PERRL, conjunctiva/corneas clear,gaze conjugate Lungs: respirations unlabored   NEUROLOGIC:   Mental status: A&O x4, no aphasia, good attention span, Memory and fund of knowledge Motor Exam - spastic functional paraplegia of the lower extremities, she does have some reflexive movements but very little volitional movement Sensory Exam - grossly decreased Reflexes: increased Coordination - not tested Gait - not tested Balance - not tested Cranial Nerves: I: smell  Not tested  II: visual acuity  OS: na    OD: na  II: visual fields Full to confrontation  II: pupils Equal, round, reactive to light  III,VII: ptosis None  III,IV,VI: extraocular muscles  Full ROM  V: mastication Normal  V: facial light touch sensation  Normal  V,VII: corneal reflex  Present  VII: facial muscle function - upper  Normal  VII: facial muscle function - lower Normal  VIII: hearing Not tested  IX: soft palate elevation  Normal  IX,X: gag reflex Present  XI: trapezius strength  5/5  XI: sternocleidomastoid strength 5/5  XI: neck flexion strength  5/5  XII: tongue strength  Normal    Data Review Lab Results  Component Value Date   WBC 5.8 09/02/2017   HGB 10.9 (L) 09/02/2017   HCT 34.0 (L) 09/02/2017   MCV 94.7 09/02/2017   PLT 190 09/02/2017   Lab Results  Component Value Date   NA 141 09/02/2017   K 4.0 09/02/2017   CL 105 09/02/2017   CO2 27 09/02/2017   BUN 17 09/02/2017   CREATININE 0.98 09/02/2017   GLUCOSE 133 (H) 09/02/2017   Lab Results  Component Value Date   INR 1.33 07/06/2015    Radiology: Dg Shoulder Right  Result Date: 09/02/2017 CLINICAL DATA:  Pain following fall EXAM: RIGHT SHOULDER - 2+ VIEW COMPARISON:  None. FINDINGS: Frontal and Y scapular images were obtained. There is no appreciable fracture or dislocation. There is moderate generalized osteoarthritic change. No erosive change or intra-articular calcification. Visualized right lung is clear. Postoperative changes present in the upper thoracic region. 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 No fracture or dislocation. Electronically Signed   By: Lowella Grip III M.D.   On: 09/02/2017 14:04   Ct Head Wo Contrast  Result Date: 09/02/2017 CLINICAL DATA:  Status post fall moving from a bed to a wheelchair today. Initial encounter. EXAM: CT HEAD WITHOUT CONTRAST CT CERVICAL SPINE WITHOUT CONTRAST TECHNIQUE: Multidetector CT imaging of the head and  cervical spine was performed following the standard protocol without intravenous contrast. Multiplanar CT image reconstructions of the cervical spine were also generated. COMPARISON:  Head CT scan 12/22/2016.  PET CT scan 05/20/2017. FINDINGS: CT HEAD FINDINGS Brain: No evidence of acute infarction, hemorrhage, hydrocephalus, extra-axial collection or mass lesion/mass effect. Vascular: A few atherosclerotic calcifications are seen. Skull: Intact.  No focal lesion. Sinuses/Orbits: Mild mucosal thickening is noted in the maxillary sinuses, frontal sinuses, sphenoid sinus and scattered ethmoid air cells. Other: None. CT CERVICAL SPINE FINDINGS Alignment: Maintained. Skull base and vertebrae: No acute fracture. No primary bone lesion or focal pathologic process. Soft tissues and spinal canal: No prevertebral fluid or swelling. No visible canal hematoma. Disc levels: Intervertebral disc space height is maintained. There is some endplate spurring most notable at C3-4 and C5-6. Partial visualization of thoracic fusion hardware noted. Upper chest: 0.5 cm left upper lobe pulmonary nodule is seen on prior PET CT scan is noted. Other: None. IMPRESSION: No acute abnormality head or cervical spine. Atherosclerosis. Sinus disease. Mild  appearing cervical spondylosis. No change in 0.5 cm left upper lobe pulmonary nodule consistent with the patient's known metastatic thyroid carcinoma. Electronically Signed   By: Inge Rise M.D.   On: 09/02/2017 14:37   Ct Cervical Spine Wo Contrast  Result Date: 09/02/2017 CLINICAL DATA:  Status post fall moving from a bed to a wheelchair today. Initial encounter. EXAM: CT HEAD WITHOUT CONTRAST CT CERVICAL SPINE WITHOUT CONTRAST TECHNIQUE: Multidetector CT imaging of the head and cervical spine was performed following the standard protocol without intravenous contrast. Multiplanar CT image reconstructions of the cervical spine were also generated. COMPARISON:  Head CT scan 12/22/2016.   PET CT scan 05/20/2017. FINDINGS: CT HEAD FINDINGS Brain: No evidence of acute infarction, hemorrhage, hydrocephalus, extra-axial collection or mass lesion/mass effect. Vascular: A few atherosclerotic calcifications are seen. Skull: Intact.  No focal lesion. Sinuses/Orbits: Mild mucosal thickening is noted in the maxillary sinuses, frontal sinuses, sphenoid sinus and scattered ethmoid air cells. Other: None. CT CERVICAL SPINE FINDINGS Alignment: Maintained. Skull base and vertebrae: No acute fracture. No primary bone lesion or focal pathologic process. Soft tissues and spinal canal: No prevertebral fluid or swelling. No visible canal hematoma. Disc levels: Intervertebral disc space height is maintained. There is some endplate spurring most notable at C3-4 and C5-6. Partial visualization of thoracic fusion hardware noted. Upper chest: 0.5 cm left upper lobe pulmonary nodule is seen on prior PET CT scan is noted. Other: None. IMPRESSION: No acute abnormality head or cervical spine. Atherosclerosis. Sinus disease. Mild appearing cervical spondylosis. No change in 0.5 cm left upper lobe pulmonary nodule consistent with the patient's known metastatic thyroid carcinoma. Electronically Signed   By: Inge Rise M.D.   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 T3 metastasis with history of prior decompression. EXAM: MRI THORACIC AND LUMBAR SPINE WITHOUT AND WITH CONTRAST TECHNIQUE: Multiplanar and multiecho pulse sequences of the thoracic and lumbar spine were obtained without and with intravenous contrast. CONTRAST:  24mL MULTIHANCE GADOBENATE DIMEGLUMINE 529 MG/ML IV SOLN COMPARISON:  Previous MRI from 12/25/2015. FINDINGS: MRI THORACIC SPINE FINDINGS Alignment: Examination mildly limited as the patient was unable to tolerate the full  length of the exam. Postcontrast axial sequences as well as post-contrast imaging through the lumbar spine was not performed. Stable alignment with preservation of the normal thoracic kyphosis. No listhesis. Vertebrae: Postoperative changes from prior fusion from T1 through T5 for T3 decompression again seen. Pathologic fracture deformity of T3 is relatively unchanged in appearance. Interval growth with progressive tumor seen within the adjacent spinal canal, extending from the mid aspect of T2 through the mid aspect of T4 (series 7, image 8). There has been marked interval worsening of cord and thecal sac progression, with the sac compressed laterally and slit like in appearance, measuring approximately 2 mm in transverse diameter at its most narrow point (series 10, image 12). Suspected cord signal abnormality at this level, although poorly visualized due to susceptibility artifact from adjacent hardware. Postoperative fluid collection again noted at the adjacent laminectomy site, likely slightly decreased in size. No other metastatic lesions identified. Reactive marrow edema and enhancement about the right T12-L1 facet favored to be degenerative in nature (series 6, image 4). Vertebral body heights otherwise maintained. Mild discogenic reactive endplate changes present at the thoracolumbar junction. Cord:  No other cord signal abnormality identified. Paraspinal and  other soft tissues: No other acute paraspinous soft tissue abnormality. Probable mild cholelithiasis noted. Visualized visceral structures otherwise unremarkable. Disc levels: T7-8: Central disc extrusion flattens the ventral thoracic spinal cord and resultant moderate spinal stenosis, stable. Left paracentral disc protrusion at T8-9 with mild flattening of the left hemi cord also unchanged. MRI LUMBAR SPINE FINDINGS Segmentation: Normal segmentation. Lowest well-formed disc labeled the L5-S1 level. Alignment: Levoscoliosis. Vertebral bodies otherwise  normally aligned with preservation of the normal lumbar lordosis. No listhesis. Vertebrae: Mild height loss with reactive endplate edema at the left aspect of the superior endplate of L4, suspicious for mild compression fracture (series 14, image 12). No bony retropulsion. This is benign in appearance, with no underlying lesion identified. No worrisome osseous lesions or evidence for metastatic disease identified within the lumbar spine. Prominent reactive marrow edema about the left L3-4 facet due to advanced facet degeneration. Conus medullaris: Extends to the L1 level and appears normal. Paraspinal and other soft tissues: Paraspinous soft tissues within normal limits. Visualized visceral structures unremarkable. Disc levels: L1-2:  Mild facet and ligament flavum hypertrophy.  No stenosis. L2-3: Minimal disc bulge with bilateral facet hypertrophy. No significant stenosis. L3-4: Disc desiccation with minimal disc bulge. Prominent anterior bridging osteophyte. Advanced bilateral facet degeneration with associated reactive marrow edema, left greater than right. 7 mm synovial cyst noted at the posterior aspect of the right L3-4 facet. Mild canal with bilateral L3 foraminal stenosis. L4-5: Chronic intervertebral disc mild diffuse disc bulge. Moderate to advanced facet arthrosis, left greater than right. Resultant moderate canal with bilateral subarticular stenosis. Foramina remain patent. L5-S1: Chronic intervertebral disc space narrowing with diffuse disc bulge. Left greater than right facet degeneration. Resultant moderate to severe left lateral recess narrowing. Mild right with mild to moderate left L5 foraminal stenosis. IMPRESSION: MRI THORACIC SPINE IMPRESSION 1. 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 Neuro surgical consultation recommended. 2. Superimposed sequelae of prior T1 through T5 fusion for T3 decompression. 3.  Stable disc protrusions at T7-8 and T8-9. 4. Prominent facet arthropathy at T12-L1 on the right with associated reactive marrow edema. MRI LUMBAR SPINE IMPRESSION 1. No evidence for metastatic disease within the lumbar spine. 2. Mild acute to subacute endplate compression deformity involving the left aspect of the superior endplate of L4. This is benign in appearance with no underlying pathologic lesion. 3. Advanced bilateral facet degeneration at L3-4 with associated reactive marrow edema, left greater than right. 4. Multilevel degenerative spondylolysis as above, most notable at L4-5 and L5-S1. Resultant moderate canal stenosis at L4-5, with moderate to severe left lateral recess narrowing at L5-S1. Critical Value/emergent results were called by telephone at the time of interpretation on 09/02/2017 at 8:30 pm to the ER PA [place] , who verbally acknowledged these results. Electronically Signed   By: Jeannine Boga M.D.   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 EXAM: MRI THORACIC AND LUMBAR SPINE WITHOUT AND WITH CONTRAST TECHNIQUE: Multiplanar and multiecho pulse sequences of the thoracic and lumbar spine were obtained without and with intravenous contrast. CONTRAST:  80mL MULTIHANCE GADOBENATE DIMEGLUMINE 529 MG/ML IV SOLN COMPARISON:  Previous MRI from 12/25/2015. FINDINGS: MRI THORACIC SPINE FINDINGS Alignment: Examination mildly limited as the patient was  unable to tolerate the full length of the exam. Postcontrast axial sequences as well as post-contrast imaging through the lumbar spine was not performed. Stable alignment with preservation of the normal thoracic kyphosis. No listhesis. Vertebrae: Postoperative changes from prior fusion from T1 through T5 for  T3 decompression again seen. Pathologic fracture deformity of T3 is relatively unchanged in appearance. Interval growth with progressive tumor seen within the adjacent spinal canal, extending from the mid aspect of T2 through the mid aspect of T4 (series 7, image 8). There has been marked interval worsening of cord and thecal sac progression, with the sac compressed laterally and slit like in appearance, measuring approximately 2 mm in transverse diameter at its most narrow point (series 10, image 12). Suspected cord signal abnormality at this level, although poorly visualized due to susceptibility artifact from adjacent hardware. Postoperative fluid collection again noted at the adjacent laminectomy site, likely slightly decreased in size. No other metastatic lesions identified. Reactive marrow edema and enhancement about the right T12-L1 facet favored to be degenerative in nature (series 6, image 4). Vertebral body heights otherwise maintained. Mild discogenic reactive endplate changes present at the thoracolumbar junction. Cord:  No other cord signal abnormality identified. Paraspinal and other soft tissues: No other acute paraspinous soft tissue abnormality. Probable mild cholelithiasis noted. Visualized visceral structures otherwise unremarkable. Disc levels: T7-8: Central disc extrusion flattens the ventral thoracic spinal cord and resultant moderate spinal stenosis, stable. Left paracentral disc protrusion at T8-9 with mild flattening of the left hemi cord also unchanged. MRI LUMBAR SPINE FINDINGS Segmentation: Normal segmentation. Lowest well-formed disc labeled the L5-S1 level. Alignment: Levoscoliosis. Vertebral bodies otherwise normally aligned with preservation of the normal lumbar lordosis. No listhesis. Vertebrae: Mild height loss with reactive endplate edema at the left aspect of the superior endplate of L4, suspicious for mild compression fracture (series 14, image 12). No bony retropulsion. This is  benign in appearance, with no underlying lesion identified. No worrisome osseous lesions or evidence for metastatic disease identified within the lumbar spine. Prominent reactive marrow edema about the left L3-4 facet due to advanced facet degeneration. Conus medullaris: Extends to the L1 level and appears normal. Paraspinal and other soft tissues: Paraspinous soft tissues within normal limits. Visualized visceral structures unremarkable. Disc levels: L1-2:  Mild facet and ligament flavum hypertrophy.  No stenosis. L2-3: Minimal disc bulge with bilateral facet hypertrophy. No significant stenosis. L3-4: Disc desiccation with minimal disc bulge. Prominent anterior bridging osteophyte. Advanced bilateral facet degeneration with associated reactive marrow edema, left greater than right. 7 mm synovial cyst noted at the posterior aspect of the right L3-4 facet. Mild canal with bilateral L3 foraminal stenosis. L4-5: Chronic intervertebral disc mild diffuse disc bulge. Moderate to advanced facet arthrosis, left greater than right. Resultant moderate canal with bilateral subarticular stenosis. Foramina remain patent. L5-S1: Chronic intervertebral disc space narrowing with diffuse disc bulge. Left greater than right facet degeneration. Resultant moderate to severe left lateral recess narrowing. Mild right with mild to moderate left L5 foraminal stenosis. IMPRESSION: MRI THORACIC SPINE IMPRESSION 1. 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 Neuro surgical consultation recommended. 2. Superimposed sequelae of prior T1 through T5 fusion for T3 decompression. 3. Stable disc protrusions at T7-8 and T8-9. 4. Prominent facet arthropathy at T12-L1 on the right with associated reactive marrow edema. MRI LUMBAR SPINE IMPRESSION 1. No evidence for metastatic disease within the lumbar spine. 2. Mild acute to subacute  endplate compression  deformity involving the left aspect of the superior endplate of L4. This is benign in appearance with no underlying pathologic lesion. 3. Advanced bilateral facet degeneration at L3-4 with associated reactive marrow edema, left greater than right. 4. Multilevel degenerative spondylolysis as above, most notable at L4-5 and L5-S1. Resultant moderate canal stenosis at L4-5, with moderate to severe left lateral recess narrowing at L5-S1. Critical Value/emergent results were called by telephone at the time of interpretation on 09/02/2017 at 8:30 pm to the ER PA [place] , who verbally acknowledged these results. Electronically Signed   By: Jeannine Boga M.D.   On: 09/02/2017 20:31     Assessment/Plan: Estimated body mass index is 29.52 kg/m as calculated from the following:   Height as of 08/24/17: 5\' 11"  (1.803 m).   Weight as of 08/09/17: 96 kg (211 lb 10.3 oz).   Unfortunate 52 year old female with metastatic thyroid cancer to T3 with epidural tumor growth and cord compression and a 2-1/2 week history of functional paraplegia. MRI was reviewed. I'm not sure surgery has a role at this time. She's had 2 previous decompressions followed by instrumented fusion. Given the length of time of her disability and the 2 previous surgeries I do not believe that there is a significant chance that she would regain neurologic function even with decompression.She states she has not had radiation to the lesion.I would recommend admission to the medicine service, radiation and medical oncology consults,[place], and we will let Dr. Christella Noa know of her presenceand he can weigh in on the role of surgery in this case. Certainly no emergent decompression is needed tonight.Eustace Moore 09/02/2017 9:37 PM

## 2017-09-03 ENCOUNTER — Other Ambulatory Visit: Payer: Self-pay

## 2017-09-03 ENCOUNTER — Inpatient Hospital Stay (HOSPITAL_COMMUNITY): Payer: Medicare Other

## 2017-09-03 ENCOUNTER — Encounter (HOSPITAL_COMMUNITY): Payer: Self-pay | Admitting: Orthopedic Surgery

## 2017-09-03 ENCOUNTER — Other Ambulatory Visit: Payer: Self-pay | Admitting: Neurosurgery

## 2017-09-03 ENCOUNTER — Inpatient Hospital Stay (HOSPITAL_COMMUNITY): Payer: Medicare Other | Admitting: Certified Registered"

## 2017-09-03 ENCOUNTER — Ambulatory Visit
Admit: 2017-09-03 | Discharge: 2017-09-03 | Disposition: A | Discharge status: Home / Self Care | Payer: Medicare Other | Attending: Radiation Oncology | Admitting: Radiation Oncology

## 2017-09-03 ENCOUNTER — Encounter: Payer: Medicare Other | Admitting: Radiation Oncology

## 2017-09-03 ENCOUNTER — Encounter (HOSPITAL_COMMUNITY)
Admission: EM | Disposition: A | Discharge status: Rehabilitation Facility | Payer: Self-pay | Source: Home / Self Care | Attending: Neurosurgery

## 2017-09-03 DIAGNOSIS — I10 Essential (primary) hypertension: Secondary | ICD-10-CM

## 2017-09-03 DIAGNOSIS — Z885 Allergy status to narcotic agent status: Secondary | ICD-10-CM

## 2017-09-03 DIAGNOSIS — R609 Edema, unspecified: Secondary | ICD-10-CM

## 2017-09-03 DIAGNOSIS — Z9889 Other specified postprocedural states: Secondary | ICD-10-CM

## 2017-09-03 DIAGNOSIS — C779 Secondary and unspecified malignant neoplasm of lymph node, unspecified: Secondary | ICD-10-CM

## 2017-09-03 DIAGNOSIS — Z886 Allergy status to analgesic agent status: Secondary | ICD-10-CM

## 2017-09-03 DIAGNOSIS — R29898 Other symptoms and signs involving the musculoskeletal system: Secondary | ICD-10-CM

## 2017-09-03 DIAGNOSIS — C781 Secondary malignant neoplasm of mediastinum: Secondary | ICD-10-CM

## 2017-09-03 DIAGNOSIS — G9529 Other cord compression: Secondary | ICD-10-CM

## 2017-09-03 DIAGNOSIS — Z86711 Personal history of pulmonary embolism: Secondary | ICD-10-CM

## 2017-09-03 DIAGNOSIS — Z881 Allergy status to other antibiotic agents status: Secondary | ICD-10-CM

## 2017-09-03 DIAGNOSIS — C73 Malignant neoplasm of thyroid gland: Secondary | ICD-10-CM

## 2017-09-03 DIAGNOSIS — E669 Obesity, unspecified: Secondary | ICD-10-CM | POA: Diagnosis present

## 2017-09-03 DIAGNOSIS — Z91030 Bee allergy status: Secondary | ICD-10-CM

## 2017-09-03 DIAGNOSIS — C7800 Secondary malignant neoplasm of unspecified lung: Secondary | ICD-10-CM

## 2017-09-03 DIAGNOSIS — R945 Abnormal results of liver function studies: Secondary | ICD-10-CM

## 2017-09-03 DIAGNOSIS — Z7901 Long term (current) use of anticoagulants: Secondary | ICD-10-CM

## 2017-09-03 DIAGNOSIS — Z923 Personal history of irradiation: Secondary | ICD-10-CM

## 2017-09-03 DIAGNOSIS — G9520 Unspecified cord compression: Secondary | ICD-10-CM

## 2017-09-03 DIAGNOSIS — Z888 Allergy status to other drugs, medicaments and biological substances status: Secondary | ICD-10-CM

## 2017-09-03 DIAGNOSIS — N390 Urinary tract infection, site not specified: Secondary | ICD-10-CM

## 2017-09-03 HISTORY — PX: LAMINECTOMY: SHX219

## 2017-09-03 LAB — POCT I-STAT 4, (NA,K, GLUC, HGB,HCT)
GLUCOSE: 160 mg/dL — AB (ref 65–99)
HEMATOCRIT: 18 % — AB (ref 36.0–46.0)
Hemoglobin: 6.1 g/dL — CL (ref 12.0–15.0)
POTASSIUM: 4.4 mmol/L (ref 3.5–5.1)
Sodium: 138 mmol/L (ref 135–145)

## 2017-09-03 LAB — SURGICAL PCR SCREEN
MRSA, PCR: NEGATIVE
Staphylococcus aureus: NEGATIVE

## 2017-09-03 LAB — PREPARE RBC (CROSSMATCH)

## 2017-09-03 SURGERY — THORACIC LAMINECTOMY FOR TUMOR
Anesthesia: General | Site: Spine Thoracic

## 2017-09-03 MED ORDER — PROMETHAZINE HCL 25 MG/ML IJ SOLN
INTRAMUSCULAR | Status: AC
  Administered 2017-09-03: 6.25 mg via INTRAVENOUS
  Filled 2017-09-03: qty 1

## 2017-09-03 MED ORDER — SUGAMMADEX SODIUM 200 MG/2ML IV SOLN
INTRAVENOUS | Status: DC | PRN
  Administered 2017-09-03: 200 mg via INTRAVENOUS

## 2017-09-03 MED ORDER — PROMETHAZINE HCL 25 MG/ML IJ SOLN
6.2500 mg | Freq: Once | INTRAMUSCULAR | Status: AC
  Administered 2017-09-03: 6.25 mg via INTRAVENOUS

## 2017-09-03 MED ORDER — FENTANYL CITRATE (PF) 100 MCG/2ML IJ SOLN
INTRAMUSCULAR | Status: AC
  Filled 2017-09-03: qty 2

## 2017-09-03 MED ORDER — MIDAZOLAM HCL 2 MG/2ML IJ SOLN
INTRAMUSCULAR | Status: AC
  Filled 2017-09-03: qty 2

## 2017-09-03 MED ORDER — SODIUM CHLORIDE 0.9 % IV SOLN
Freq: Once | INTRAVENOUS | Status: AC
  Administered 2017-09-03: 21:00:00 via INTRAVENOUS

## 2017-09-03 MED ORDER — SODIUM CHLORIDE 0.9 % IV SOLN
INTRAVENOUS | Status: DC
  Administered 2017-09-03 (×2): via INTRAVENOUS

## 2017-09-03 MED ORDER — LIDOCAINE 2% (20 MG/ML) 5 ML SYRINGE
INTRAMUSCULAR | Status: DC | PRN
  Administered 2017-09-03: 60 mg via INTRAVENOUS

## 2017-09-03 MED ORDER — FLUTICASONE PROPIONATE 50 MCG/ACT NA SUSP
2.0000 | Freq: Every day | NASAL | Status: DC
  Administered 2017-09-04 – 2017-09-07 (×4): 2 via NASAL
  Filled 2017-09-03: qty 16

## 2017-09-03 MED ORDER — LACTATED RINGERS IV SOLN
INTRAVENOUS | Status: DC | PRN
  Administered 2017-09-03 (×2): via INTRAVENOUS

## 2017-09-03 MED ORDER — PHENYLEPHRINE 40 MCG/ML (10ML) SYRINGE FOR IV PUSH (FOR BLOOD PRESSURE SUPPORT)
PREFILLED_SYRINGE | INTRAVENOUS | Status: DC | PRN
  Administered 2017-09-03 (×2): 200 ug via INTRAVENOUS
  Administered 2017-09-03: 160 ug via INTRAVENOUS
  Administered 2017-09-03: 200 ug via INTRAVENOUS

## 2017-09-03 MED ORDER — PHENOL 1.4 % MT LIQD: 1.0000 | OROMUCOSAL | Status: DC | PRN

## 2017-09-03 MED ORDER — FENTANYL CITRATE (PF) 250 MCG/5ML IJ SOLN
INTRAMUSCULAR | Status: DC | PRN
  Administered 2017-09-03 (×2): 50 ug via INTRAVENOUS

## 2017-09-03 MED ORDER — PHENYLEPHRINE HCL 10 MG/ML IJ SOLN
INTRAVENOUS | Status: DC | PRN
  Administered 2017-09-03: 80 ug/min via INTRAVENOUS

## 2017-09-03 MED ORDER — SUGAMMADEX SODIUM 200 MG/2ML IV SOLN
INTRAVENOUS | Status: AC
  Filled 2017-09-03: qty 2

## 2017-09-03 MED ORDER — ACETAMINOPHEN 650 MG RE SUPP: 650.0000 mg | RECTAL | Status: DC | PRN

## 2017-09-03 MED ORDER — ONDANSETRON HCL 4 MG/2ML IJ SOLN
INTRAMUSCULAR | Status: AC
  Filled 2017-09-03: qty 2

## 2017-09-03 MED ORDER — ROCURONIUM BROMIDE 10 MG/ML (PF) SYRINGE
PREFILLED_SYRINGE | INTRAVENOUS | Status: AC
  Filled 2017-09-03: qty 10

## 2017-09-03 MED ORDER — MAGNESIUM CITRATE PO SOLN: 1.0000 | Freq: Once | ORAL | Status: DC | PRN

## 2017-09-03 MED ORDER — LIDOCAINE-EPINEPHRINE 0.5 %-1:200000 IJ SOLN
INTRAMUSCULAR | Status: AC
  Filled 2017-09-03: qty 1

## 2017-09-03 MED ORDER — DEXAMETHASONE SODIUM PHOSPHATE 10 MG/ML IJ SOLN
INTRAMUSCULAR | Status: AC
  Filled 2017-09-03: qty 1

## 2017-09-03 MED ORDER — ROCURONIUM BROMIDE 10 MG/ML (PF) SYRINGE
PREFILLED_SYRINGE | INTRAVENOUS | Status: DC | PRN
  Administered 2017-09-03: 20 mg via INTRAVENOUS
  Administered 2017-09-03: 60 mg via INTRAVENOUS

## 2017-09-03 MED ORDER — ZOLPIDEM TARTRATE 5 MG PO TABS: 5.0000 mg | ORAL_TABLET | Freq: Every evening | ORAL | Status: DC | PRN

## 2017-09-03 MED ORDER — FENTANYL CITRATE (PF) 100 MCG/2ML IJ SOLN
25.0000 ug | INTRAMUSCULAR | Status: DC | PRN
  Administered 2017-09-03: 25 ug via INTRAVENOUS

## 2017-09-03 MED ORDER — LIDOCAINE-EPINEPHRINE 0.5 %-1:200000 IJ SOLN
INTRAMUSCULAR | Status: DC | PRN
  Administered 2017-09-03: 50 mL

## 2017-09-03 MED ORDER — PROPOFOL 10 MG/ML IV BOLUS
INTRAVENOUS | Status: AC
  Filled 2017-09-03: qty 20

## 2017-09-03 MED ORDER — THROMBIN 20000 UNITS EX SOLR
CUTANEOUS | Status: AC
  Filled 2017-09-03: qty 20000

## 2017-09-03 MED ORDER — THROMBIN 5000 UNITS EX SOLR
OROMUCOSAL | Status: DC | PRN
  Administered 2017-09-03 (×2): 5 mL via TOPICAL

## 2017-09-03 MED ORDER — SODIUM CHLORIDE 0.9 % IV SOLN: 250.0000 mL | INTRAVENOUS | Status: DC

## 2017-09-03 MED ORDER — PROPOFOL 10 MG/ML IV BOLUS
INTRAVENOUS | Status: DC | PRN
  Administered 2017-09-03: 100 mg via INTRAVENOUS

## 2017-09-03 MED ORDER — SODIUM CHLORIDE 0.9% FLUSH
3.0000 mL | Freq: Two times a day (BID) | INTRAVENOUS | Status: DC
  Administered 2017-09-04 – 2017-09-08 (×5): 3 mL via INTRAVENOUS

## 2017-09-03 MED ORDER — LIDOCAINE 2% (20 MG/ML) 5 ML SYRINGE
INTRAMUSCULAR | Status: AC
  Filled 2017-09-03: qty 5

## 2017-09-03 MED ORDER — ACETAMINOPHEN 325 MG PO TABS: 650.0000 mg | ORAL_TABLET | ORAL | Status: DC | PRN

## 2017-09-03 MED ORDER — THROMBIN 20000 UNITS EX SOLR: CUTANEOUS | Status: DC | PRN

## 2017-09-03 MED ORDER — FENTANYL CITRATE (PF) 250 MCG/5ML IJ SOLN
INTRAMUSCULAR | Status: AC
  Filled 2017-09-03: qty 5

## 2017-09-03 MED ORDER — BACITRACIN ZINC 500 UNIT/GM EX OINT: TOPICAL_OINTMENT | CUTANEOUS | Status: DC | PRN

## 2017-09-03 MED ORDER — THROMBIN 5000 UNITS EX SOLR
CUTANEOUS | Status: AC
  Filled 2017-09-03: qty 5000

## 2017-09-03 MED ORDER — THROMBIN 5000 UNITS EX SOLR
CUTANEOUS | Status: AC
  Filled 2017-09-03: qty 10000

## 2017-09-03 MED ORDER — MENTHOL 3 MG MT LOZG: 1.0000 | LOZENGE | OROMUCOSAL | Status: DC | PRN

## 2017-09-03 MED ORDER — SODIUM CHLORIDE 0.9% FLUSH: 3.0000 mL | INTRAVENOUS | Status: DC | PRN

## 2017-09-03 MED ORDER — SENNOSIDES-DOCUSATE SODIUM 8.6-50 MG PO TABS: 1.0000 | ORAL_TABLET | Freq: Every evening | ORAL | Status: DC | PRN

## 2017-09-03 MED ORDER — BACITRACIN ZINC 500 UNIT/GM EX OINT
TOPICAL_OINTMENT | CUTANEOUS | Status: AC
  Filled 2017-09-03: qty 28.35

## 2017-09-03 MED ORDER — ARTIFICIAL TEARS OPHTHALMIC OINT
TOPICAL_OINTMENT | OPHTHALMIC | Status: AC
  Filled 2017-09-03: qty 3.5

## 2017-09-03 MED ORDER — POTASSIUM CHLORIDE IN NACL 20-0.9 MEQ/L-% IV SOLN
INTRAVENOUS | Status: DC
  Administered 2017-09-04 – 2017-09-05 (×4): via INTRAVENOUS
  Filled 2017-09-03 (×4): qty 1000

## 2017-09-03 MED ORDER — DOCUSATE SODIUM 100 MG PO CAPS
100.0000 mg | ORAL_CAPSULE | Freq: Two times a day (BID) | ORAL | Status: DC
  Administered 2017-09-04 – 2017-09-06 (×3): 100 mg via ORAL
  Filled 2017-09-03 (×9): qty 1

## 2017-09-03 MED ORDER — ALBUMIN HUMAN 5 % IV SOLN
INTRAVENOUS | Status: DC | PRN
  Administered 2017-09-03 (×3): via INTRAVENOUS

## 2017-09-03 MED ORDER — PHENYLEPHRINE 40 MCG/ML (10ML) SYRINGE FOR IV PUSH (FOR BLOOD PRESSURE SUPPORT)
PREFILLED_SYRINGE | INTRAVENOUS | Status: AC
  Filled 2017-09-03: qty 10

## 2017-09-03 MED ORDER — THROMBIN 20000 UNITS EX SOLR
OROMUCOSAL | Status: DC | PRN
  Administered 2017-09-03: 20 mL via TOPICAL

## 2017-09-03 MED ORDER — ACETAMINOPHEN 500 MG PO TABS
1000.0000 mg | ORAL_TABLET | Freq: Four times a day (QID) | ORAL | Status: AC
  Administered 2017-09-03 – 2017-09-04 (×2): 1000 mg via ORAL
  Filled 2017-09-03 (×2): qty 2

## 2017-09-03 MED ORDER — EPHEDRINE SULFATE-NACL 50-0.9 MG/10ML-% IV SOSY
PREFILLED_SYRINGE | INTRAVENOUS | Status: DC | PRN
  Administered 2017-09-03: 10 mg via INTRAVENOUS
  Administered 2017-09-03: 20 mg via INTRAVENOUS

## 2017-09-03 MED ORDER — MIDAZOLAM HCL 2 MG/2ML IJ SOLN
INTRAMUSCULAR | Status: DC | PRN
  Administered 2017-09-03: 2 mg via INTRAVENOUS

## 2017-09-03 MED ORDER — DIAZEPAM 5 MG PO TABS
5.0000 mg | ORAL_TABLET | Freq: Four times a day (QID) | ORAL | Status: DC | PRN
  Administered 2017-09-07: 5 mg via ORAL
  Filled 2017-09-03 (×2): qty 1

## 2017-09-03 MED ORDER — DEXAMETHASONE SODIUM PHOSPHATE 10 MG/ML IJ SOLN
INTRAMUSCULAR | Status: DC | PRN
  Administered 2017-09-03: 10 mg via INTRAVENOUS

## 2017-09-03 MED ORDER — CELECOXIB 200 MG PO CAPS
200.0000 mg | ORAL_CAPSULE | Freq: Two times a day (BID) | ORAL | Status: DC
  Administered 2017-09-04 – 2017-09-08 (×10): 200 mg via ORAL
  Filled 2017-09-03 (×11): qty 1

## 2017-09-03 MED ORDER — ONDANSETRON HCL 4 MG/2ML IJ SOLN
INTRAMUSCULAR | Status: DC | PRN
  Administered 2017-09-03: 4 mg via INTRAVENOUS

## 2017-09-03 SURGICAL SUPPLY — 59 items
BLADE CLIPPER SURG (BLADE) IMPLANT
BLADE SURG 11 STRL SS (BLADE) IMPLANT
BUR MATCHSTICK NEURO 3.0 LAGG (BURR) IMPLANT
CANISTER SUCT 3000ML PPV (MISCELLANEOUS) ×3 IMPLANT
CARTRIDGE OIL MAESTRO DRILL (MISCELLANEOUS) ×1 IMPLANT
DERMABOND ADVANCED (GAUZE/BANDAGES/DRESSINGS) ×4
DERMABOND ADVANCED .7 DNX12 (GAUZE/BANDAGES/DRESSINGS) ×2 IMPLANT
DIFFUSER DRILL AIR PNEUMATIC (MISCELLANEOUS) ×3 IMPLANT
DRAPE LAPAROTOMY 100X72X124 (DRAPES) ×3 IMPLANT
DRAPE MICROSCOPE LEICA (MISCELLANEOUS) ×3 IMPLANT
DRAPE POUCH INSTRU U-SHP 10X18 (DRAPES) ×3 IMPLANT
DRAPE SURG 17X23 STRL (DRAPES) ×6 IMPLANT
DRSG OPSITE POSTOP 4X8 (GAUZE/BANDAGES/DRESSINGS) ×3 IMPLANT
ELECT REM PT RETURN 9FT ADLT (ELECTROSURGICAL) ×3
ELECTRODE REM PT RTRN 9FT ADLT (ELECTROSURGICAL) ×1 IMPLANT
GAUZE SPONGE 4X4 12PLY STRL (GAUZE/BANDAGES/DRESSINGS) ×3 IMPLANT
GAUZE SPONGE 4X4 16PLY XRAY LF (GAUZE/BANDAGES/DRESSINGS) IMPLANT
GLOVE BIO SURGEON STRL SZ 6.5 (GLOVE) ×2 IMPLANT
GLOVE BIO SURGEON STRL SZ7 (GLOVE) ×3 IMPLANT
GLOVE BIO SURGEONS STRL SZ 6.5 (GLOVE) ×1
GLOVE ECLIPSE 6.5 STRL STRAW (GLOVE) ×3 IMPLANT
GLOVE EXAM NITRILE LRG STRL (GLOVE) IMPLANT
GLOVE EXAM NITRILE XL STR (GLOVE) IMPLANT
GLOVE EXAM NITRILE XS STR PU (GLOVE) IMPLANT
GOWN STRL REUS W/ TWL LRG LVL3 (GOWN DISPOSABLE) ×4 IMPLANT
GOWN STRL REUS W/ TWL XL LVL3 (GOWN DISPOSABLE) IMPLANT
GOWN STRL REUS W/TWL 2XL LVL3 (GOWN DISPOSABLE) IMPLANT
GOWN STRL REUS W/TWL LRG LVL3 (GOWN DISPOSABLE) ×8
GOWN STRL REUS W/TWL XL LVL3 (GOWN DISPOSABLE)
HEMOSTAT SURGICEL 2X14 (HEMOSTASIS) IMPLANT
KIT BASIN OR (CUSTOM PROCEDURE TRAY) ×3 IMPLANT
KIT TURNOVER KIT B (KITS) ×3 IMPLANT
NEEDLE HYPO 22GX1.5 SAFETY (NEEDLE) ×3 IMPLANT
NEEDLE SPNL 20GX3.5 QUINCKE YW (NEEDLE) IMPLANT
NS IRRIG 1000ML POUR BTL (IV SOLUTION) ×3 IMPLANT
OIL CARTRIDGE MAESTRO DRILL (MISCELLANEOUS) ×3
PACK LAMINECTOMY NEURO (CUSTOM PROCEDURE TRAY) ×3 IMPLANT
PATTIES SURGICAL .5 X.5 (GAUZE/BANDAGES/DRESSINGS) ×6 IMPLANT
PATTIES SURGICAL .5 X3 (DISPOSABLE) ×3 IMPLANT
PATTIES SURGICAL 1X1 (DISPOSABLE) ×3 IMPLANT
RUBBERBAND STERILE (MISCELLANEOUS) ×6 IMPLANT
SPONGE LAP 4X18 RFD (DISPOSABLE) IMPLANT
SPONGE SURGIFOAM ABS GEL 100 (HEMOSTASIS) ×3 IMPLANT
STAPLER VISISTAT 35W (STAPLE) ×3 IMPLANT
SUT BONE WAX W31G (SUTURE) IMPLANT
SUT ETHILON 4 0 PS 2 18 (SUTURE) ×3 IMPLANT
SUT NURALON 4 0 TR CR/8 (SUTURE) IMPLANT
SUT PROLENE 6 0 BV (SUTURE) IMPLANT
SUT VIC AB 0 CT1 18XCR BRD8 (SUTURE) ×2 IMPLANT
SUT VIC AB 0 CT1 8-18 (SUTURE) ×4
SUT VIC AB 2-0 CT1 18 (SUTURE) ×6 IMPLANT
SUT VIC AB 3-0 SH 8-18 (SUTURE) ×3 IMPLANT
SUT VICRYL 4-0 PS2 18IN ABS (SUTURE) IMPLANT
TOWEL GREEN STERILE (TOWEL DISPOSABLE) ×3 IMPLANT
TOWEL GREEN STERILE FF (TOWEL DISPOSABLE) ×3 IMPLANT
TRAY FOLEY MTR SLVR 16FR STAT (SET/KITS/TRAYS/PACK) ×3 IMPLANT
TUBE CONNECTING 12'X1/4 (SUCTIONS)
TUBE CONNECTING 12X1/4 (SUCTIONS) IMPLANT
WATER STERILE IRR 1000ML POUR (IV SOLUTION) ×3 IMPLANT

## 2017-09-03 NOTE — Progress Notes (Signed)
Patient transferred to [facility] surgery center via [place]. Family present during transfer. All belongings w/ family/patient.

## 2017-09-03 NOTE — Op Note (Signed)
09/03/2017  9:07 PM  PATIENT:  Sarah [name]  52 y.o. female with dense plegia in the lower extremities for approximately 3-5 days. MRI revealed tumor recurrence at T3 with severe spinal cord compression.   PRE-OPERATIVE DIAGNOSIS:  THORACIC Metastatic hurthle  TUMOR T3  POST-OPERATIVE DIAGNOSIS:  THORACIC Metastatic hurthle TUMOR T3 Cord compression PROCEDURE:  Procedure(s): POSTERIOR SPINAL TUMOR RESECTION THORACIC THREE  SURGEON: Surgeon(s): Ashok Pall, MD Newman Pies, MD  ASSISTANTS:Jenkins, Dellis Filbert  ANESTHESIA:   general  EBL:  Total I/O In: 8295 [I.V.:2000; IV Piggyback:750] Out: 1250 [Urine:250; Blood:1000]  BLOOD ADMINISTERED:none  CELL SAVER GIVEN:none  COUNT:per nursing  DRAINS: (1) Hemovact drain(s) in the subfascial plane with  Suction Open   SPECIMEN:  Source of Specimen:  Thoracic 3 vertebra  DICTATION: Sarah [name] was taken to the operating room, intubated, and placed under a general anesthetic without difficulty. She was positioned prone on a [name] with all pressure points padded. Her previous incision, and thoracic region was prepped and draped in a sterile manner. I opened the incision with a 10 blade and dissected sharply to the cross link which I had placed previously. I then exposed the hardware at T2 and T4. I dissected through scar and was able to identify the tumor laterally on the left side initially. I opened the tumor capsule and resected it in a piecemeal fashion. I removed the tumor which was encasing the spinal cord. I then started to remove tumor on the other side. I did not achieve a total resection, but certainly decompressed the spinal cord as it was pulsatile when I finished, and clear margins were present between tumor and spinal cord. I achieved hemostasis with some ooze, thus I placed a hemovac drain. We closed the wound approximating the thoracolumbar fascia, subcutaneous, and subcuticular layers with vicryl sutures. I  applied a sterile dressing. She was rolled supine and extubated.  PLAN OF CARE: Admit to inpatient   PATIENT DISPOSITION:  PACU - hemodynamically stable.   Delay start of Pharmacological VTE agent (>24hrs) due to surgical blood loss or risk of bleeding:  yes

## 2017-09-03 NOTE — Consult Note (Signed)
Radiation Oncology         ([phone]) [phone] ________________________________  Name: Sarah [name]        MRN: [id number]  Date of Service: 09/03/17 DOB: 01-Oct-1965  RX:[id number], Earna Coder, MD  No ref. provider found     REFERRING PHYSICIAN: No ref. 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 Diagnoses of Fall, initial encounter, Acute pain of right shoulder, Neck pain, Leg weakness, bilateral, Chronic anemia, Essential hypertension, Lower urinary tract infectious disease, and Metastasis to spinal column [facility]) were also pertinent to this visit.   HISTORY OF PRESENT ILLNESS: Sarah [name] is a 52 y.o. female seen at the request of Dr. Ronnald Ramp for a history of metastatic thyroid cancer. She was originally diagnosed in 2015 when she had a mass in the T3 vertebral body causing cord compression and a 7.2 x 5.2 x 11.2 cm mass in the left thyroid. She was decompressed surgically with Dr. Christella Noa on 12/14/13 and final pathology revealed carcinoma most consistent with thyroid primary. 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 She went on to receive 30 Gy to C7-T6 with Dr. [name] which she completed on 01/31/14. The patient made a tremendous recover and regained her ability to walk about 6 months after surgery. Her course was prolonged but she has had an excellent quality of life as a result. Unfortunately she was found to have recurrent disease at T3 vertebral body in the spring of 2017 when she presented with cord compression again. There was discussion of re-irradiation versus surgical decompression which she  underwent with Dr. Christella Noa on 07/05/15. She again had a remarkable recovery and regained use of her legs, and her continence. She had been able to walk with a walker, and stand up completely unassisted until about 2 1/2 weeks ago when she started noticing increasing  lower extremity weakness. She had an appointment last Monday with [name] and she was offered admission to work up her symptoms versus outpatient MRI. She elected for outpatient MRI, but yesterday had an episode of progressive weakness and fell. She was brought by EMS to the hospital overnight and CT of the head and C spine were negative for acute changes. Her MRI of the thoracic and lumbar spine yesterday evening reveal concerns for interval worsening of the T3 lesion with worsened mass effect on the thecal sac and cord compression. there was stable changes from prior instrumentation, and in the lumbar spine no metastatic changes. She was seen by Dr. Ronnald Ramp and he did not feel that she would benefit overnight from decompression, but wanted to discuss her case with Dr. Christella Noa and requested radiation oncology input to consider re-irradiation.  PREVIOUS RADIATION THERAPY: Yes   01/18/14-01/31/14: 30 Gy in 10 fractions from C7-T6  Radioactive Iodine in 2015 though she did not respond to this therapy.  PAST MEDICAL HISTORY:  Past Medical History:  Diagnosis Date  . Cancer [facility])    FNA of thyroid positive for onconytic/hurthle cell carcinoma  . Chronic back pain   . DDD (degenerative disc disease), cervical   . DJD (degenerative joint disease)   . History of rectal fissure   . HIT (heparin-induced thrombocytopenia) ([place])   . Hypertension   . Obesity   . Paraplegia at T4 level ([place])   . Pulmonary embolus, right ([place]) 2015  . Rotator cuff tendonitis right       PAST SURGICAL HISTORY:  Past Surgical History:  Procedure Laterality Date  . LAMINECTOMY N/A 12/14/2013   Procedure: THORACIC LAMINECTOMY FOR TUMOR THORACIC THREE;  Surgeon: Ashok Pall, MD;  Location: [place] NEURO ORS;  Service: Neurosurgery;  Laterality: N/A;  THORACIC LAMINECTOMY FOR TUMOR THORACIC THREE  . LAMINECTOMY N/A 07/05/2015   Procedure: THORACIC LAMINECTOMY FOR TUMOR;  Surgeon: Ashok Pall, MD;  Location: [place] NEURO ORS;   Service: Neurosurgery;  Laterality: N/A;  . POSTERIOR LUMBAR FUSION 4 LEVEL N/A 12/30/2013   Procedure: Thoracic one-Thoracic five posterior thoracic fusion with pedicle screws;  Surgeon: Ashok Pall, MD;  Location: [place] NEURO ORS;  Service: Neurosurgery;  Laterality: N/A;  Thoracic one-Thoracic five posterior thoracic fusion with pedicle screws  . THYROIDECTOMY N/A 01/09/2014   Procedure: TOTAL THYROIDECTOMY;  Surgeon: Izora Gala, MD;  Location: [place];  Service: ENT;  Laterality: N/A;  . TONSILLECTOMY       FAMILY HISTORY:  Family History  Problem Relation Age of Onset  . Hypertension Mother   . Diabetes Mother   . Hypertension Father   . Stroke Father   . Diabetes Father   . Diabetes Sister   . Diabetes Sister      SOCIAL HISTORY:  reports that she has never smoked. She has never used smokeless tobacco. She reports that she does not drink alcohol or use drugs. The patient is single and lives in [place]. She is accompanied by her mother and her sister.   ALLERGIES: Bee venom; Keflex [cephalexin]; Tramadol; Gabapentin; Heparin; Hydrochlorothiazide; Hydrocodone; and Oxycodone-acetaminophen   MEDICATIONS:  Current Facility-Administered Medications  Medication Dose Route Frequency Provider Last Rate Last Dose  . baclofen (LIORESAL) tablet 20 mg  20 mg Oral TID Emokpae, Ejiroghene E, MD   20 mg at 09/03/17 0101  . cefTRIAXone (ROCEPHIN) 2 g in sodium chloride 0.9 % 100 mL IVPB  2 g Intravenous QHS Emokpae, Ejiroghene E, MD   Stopped at 09/03/17 0150  . dexamethasone (DECADRON) injection 4 mg  4 mg Intravenous Q6H Emokpae, Ejiroghene E, MD   4 mg at 09/03/17 0517  . fluticasone (FLONASE) 50 MCG/ACT nasal spray 2 spray  2 spray Each Nare Daily Emokpae, Ejiroghene E, MD      . fondaparinux (ARIXTRA) injection 7.5 mg  7.5 mg Subcutaneous Q0600 Emokpae, Ejiroghene E, MD   7.5 mg at 09/03/17 0110  . HYDROcodone-acetaminophen (NORCO/VICODIN) 5-325 MG per tablet 1-2 tablet  1-2 tablet Oral Q4H  PRN Emokpae, Ejiroghene E, MD   1 tablet at 09/03/17 0404  . levothyroxine (SYNTHROID, LEVOTHROID) tablet 137 mcg  137 mcg Oral QAC breakfast Emokpae, Ejiroghene E, MD      . lisinopril (PRINIVIL,ZESTRIL) tablet 40 mg  40 mg Oral Daily Emokpae, Ejiroghene E, MD   40 mg at 09/03/17 0055  . NIFEdipine (PROCARDIA-XL/ADALAT-CC/NIFEDICAL-XL) 24 hr tablet 30 mg  30 mg Oral Daily Emokpae, Ejiroghene E, MD      . ondansetron (ZOFRAN) tablet 4 mg  4 mg Oral Q6H PRN Emokpae, Ejiroghene E, MD       Or  . ondansetron (ZOFRAN) injection 4 mg  4 mg Intravenous Q6H PRN Emokpae, Ejiroghene E, MD      . pantoprazole (PROTONIX) EC tablet 40 mg  40 mg Oral Daily Emokpae, Ejiroghene E, MD      . senna (SENOKOT) tablet 17.2 mg  2 tablet Oral Daily Emokpae, Ejiroghene E, MD      . sertraline (ZOLOFT) tablet 25 mg  25 mg Oral Daily Emokpae, Ejiroghene E, MD      .  traZODone (DESYREL) tablet 50 mg  50 mg Oral QHS PRN Emokpae, Ejiroghene E, MD   50 mg at 09/03/17 0101     REVIEW OF SYSTEMS: On review of systems, the patient reports that she is doing okay but has been concerned that she has not been able to move her lower extremities much at all. She is on Dexamethasone 4 mg q 6 hours. She denies any chest pain, shortness of breath, cough, fevers, chills, night sweats, unintended weight changes. She denies any control of her of  bowel or bladder. In retrospect she has had some sharp shooting pain in her upper chest wall and right shoulder. She denies abdominal pain, nausea or vomiting. She denies any new musculoskeletal or joint aches or pains. A complete review of systems is obtained and is otherwise negative.     PHYSICAL EXAM:  Wt Readings from Last 3 Encounters:  09/02/17 218 lb 4.1 oz (99 kg)  08/09/17 211 lb 10.3 oz (96 kg)  05/28/17 208 lb (94.3 kg)   Temp Readings from Last 3 Encounters:  09/03/17 99 F (37.2 C) (Oral)  08/24/17 98.6 F (37 C) (Oral)  08/21/17 98 F (36.7 C) (Oral)   BP Readings from  Last 3 Encounters:  09/03/17 99/63  08/24/17 118/90  08/21/17 98/78   Pulse Readings from Last 3 Encounters:  09/03/17 92  08/24/17 93  08/21/17 89   Pain Assessment Pain Score: 2 /10  In general this is a well appearing caucasian female in no acute distress. She is alert and oriented x4 and appropriate throughout the examination. HEENT reveals that the patient is normocephalic, atraumatic. EOMs are intact. PERRLA. Skin is intact without any evidence of gross lesions of her upper extremities. She has several eschars of her lower extremities bilaterally. Cardiopulmonary assessment is negative for acute distress and she exhibits normal effort. Lower extremities are negative for deep calf tenderness, cyanosis or clubbing, but she is unable to detect sensation to light touch over bilateral lower extremities, and is unable to lift her legs.    ECOG = 4  0 - Asymptomatic (Fully active, able to carry on all predisease activities without restriction)  1 - Symptomatic but completely ambulatory (Restricted in physically strenuous activity but ambulatory and able to carry out work of a light or sedentary nature. For example, light housework, office work)  2 - Symptomatic, <50% in bed during the day (Ambulatory and capable of all self care but unable to carry out any work activities. Up and about more than 50% of waking hours)  3 - Symptomatic, >50% in bed, but not bedbound (Capable of only limited self-care, confined to bed or chair 50% or more of waking hours)  4 - Bedbound (Completely disabled. Cannot carry on any self-care. Totally confined to bed or chair)  5 - Death   Eustace Pen MM, Creech RH, Tormey DC, et al. [phone]). "Toxicity and response criteria of the [facility]". Lead Oncol. 5 (6): 649-55    LABORATORY DATA:  Lab Results  Component Value Date   WBC 5.8 09/02/2017   HGB 10.9 (L) 09/02/2017   HCT 34.0 (L) 09/02/2017   MCV 94.7 09/02/2017   PLT 190  09/02/2017   Lab Results  Component Value Date   NA 141 09/02/2017   K 4.0 09/02/2017   CL 105 09/02/2017   CO2 27 09/02/2017   Lab Results  Component Value Date   ALT 64 (H) 08/24/2017   AST 39 (H) 08/24/2017  ALKPHOS 328 (H) 08/24/2017   BILITOT 0.6 08/24/2017      RADIOGRAPHY: Ct Abdomen Pelvis Wo Contrast  Result Date: 08/09/2017 CLINICAL DATA:  Per ED notes: Pt presents today for nausea x 2 days. Took zofran at home with no relief. Pt denies diarrhea. Pt take oral chemo for thyroid CA with mets. Pt denies being around anyone with similar symptoms. EXAM: CT ABDOMEN AND PELVIS WITHOUT CONTRAST TECHNIQUE: Multidetector CT imaging of the abdomen and pelvis was performed following the standard protocol without IV contrast. COMPARISON:  PET-CT 05/20/2017 and previous FINDINGS: Lower chest: Lower lobe pulmonary nodules up to 5 mm, stable since previous. No pleural or pericardial effusion. Hepatobiliary: Gallbladder is distended with small partially calcified stones in its dependent aspect. Unremarkable liver. Pancreas: Unremarkable. No pancreatic ductal dilatation or surrounding inflammatory changes. Spleen: Normal in size without focal abnormality. Adrenals/Urinary Tract: Adrenal glands are unremarkable. Kidneys are normal, without renal calculi, focal lesion, or hydronephrosis. Bladder is unremarkable. Stomach/Bowel: Stomach is nondistended. Small bowel is nondilated. Colon is nondilated with a few scattered diverticula. Vascular/Lymphatic: No significant vascular findings are present. No enlarged abdominal or pelvic lymph nodes. Reproductive: Uterus and bilateral adnexa are unremarkable. Other: No ascites.  No free air. Musculoskeletal: Spondylitic changes throughout the lumbar spine. Bilateral hip degenerative spurring. No fracture or worrisome bone lesion. IMPRESSION: 1. No acute findings. 2. Stable small pulmonary nodules at the visualized lung bases. Electronically Signed   By: Lucrezia Europe  M.D.   On: 08/09/2017 13:54   Dg Shoulder Right  Result Date: 09/02/2017 CLINICAL DATA:  Pain following fall EXAM: RIGHT SHOULDER - 2+ VIEW COMPARISON:  None. FINDINGS: Frontal and Y scapular images were obtained. There is no appreciable fracture or dislocation. There is moderate generalized osteoarthritic change. No erosive change or intra-articular calcification. Visualized right lung is clear. Postoperative changes present in the upper thoracic region. 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 No fracture or dislocation. Electronically Signed   By: Lowella Grip III M.D.   On: 09/02/2017 14:04   Ct Head Wo Contrast  Result Date: 09/02/2017 CLINICAL DATA:  Status post fall moving from a bed to a wheelchair today. Initial encounter. EXAM: CT HEAD WITHOUT CONTRAST CT CERVICAL SPINE WITHOUT CONTRAST TECHNIQUE: Multidetector CT imaging of the head and cervical spine was performed following the standard protocol without intravenous contrast. Multiplanar CT image reconstructions of the cervical spine were also generated. COMPARISON:  Head CT scan 12/22/2016.  PET CT scan 05/20/2017. FINDINGS: CT HEAD FINDINGS Brain: No evidence of acute infarction, hemorrhage, hydrocephalus, extra-axial collection or mass lesion/mass effect. Vascular: A few atherosclerotic calcifications are seen. Skull: Intact.  No focal lesion. Sinuses/Orbits: Mild mucosal thickening is noted in the maxillary sinuses, frontal sinuses, sphenoid sinus and scattered ethmoid air cells. Other: None. CT CERVICAL SPINE FINDINGS Alignment: Maintained. Skull base and vertebrae: No acute fracture. No primary bone lesion or focal pathologic process. Soft tissues and spinal canal: No prevertebral fluid or swelling. No visible canal hematoma. Disc levels: Intervertebral disc space height is maintained. There is some endplate spurring most notable at C3-4 and C5-6. Partial visualization of thoracic fusion hardware noted.  Upper chest: 0.5 cm left upper lobe pulmonary nodule is seen on prior PET CT scan is noted. Other: None. IMPRESSION: No acute abnormality head or cervical spine. Atherosclerosis. Sinus disease. Mild appearing cervical spondylosis. No change in 0.5 cm left upper lobe pulmonary nodule consistent with the patient's known metastatic thyroid carcinoma. Electronically Signed   By: Marcello Moores  Dalessio M.D.   On: 09/02/2017 14:37   Ct Cervical Spine Wo Contrast  Result Date: 09/02/2017 CLINICAL DATA:  Status post fall moving from a bed to a wheelchair today. Initial encounter. EXAM: CT HEAD WITHOUT CONTRAST CT CERVICAL SPINE WITHOUT CONTRAST TECHNIQUE: Multidetector CT imaging of the head and cervical spine was performed following the standard protocol without intravenous contrast. Multiplanar CT image reconstructions of the cervical spine were also generated. COMPARISON:  Head CT scan 12/22/2016.  PET CT scan 05/20/2017. FINDINGS: CT HEAD FINDINGS Brain: No evidence of acute infarction, hemorrhage, hydrocephalus, extra-axial collection or mass lesion/mass effect. Vascular: A few atherosclerotic calcifications are seen. Skull: Intact.  No focal lesion. Sinuses/Orbits: Mild mucosal thickening is noted in the maxillary sinuses, frontal sinuses, sphenoid sinus and scattered ethmoid air cells. Other: None. CT CERVICAL SPINE FINDINGS Alignment: Maintained. Skull base and vertebrae: No acute fracture. No primary bone lesion or focal pathologic process. Soft tissues and spinal canal: No prevertebral fluid or swelling. No visible canal hematoma. Disc levels: Intervertebral disc space height is maintained. There is some endplate spurring most notable at C3-4 and C5-6. Partial visualization of thoracic fusion hardware noted. Upper chest: 0.5 cm left upper lobe pulmonary nodule is seen on prior PET CT scan is noted. Other: None. IMPRESSION: No acute abnormality head or cervical spine. Atherosclerosis. Sinus disease. Mild appearing  cervical spondylosis. No change in 0.5 cm left upper lobe pulmonary nodule consistent with the patient's known metastatic thyroid carcinoma. Electronically Signed   By: Inge Rise M.D.   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 T3 metastasis with history of prior decompression. EXAM: MRI THORACIC AND LUMBAR SPINE WITHOUT AND WITH CONTRAST TECHNIQUE: Multiplanar and multiecho pulse sequences of the thoracic and lumbar spine were obtained without and with intravenous contrast. CONTRAST:  94mL MULTIHANCE GADOBENATE DIMEGLUMINE 529 MG/ML IV SOLN COMPARISON:  Previous MRI from 12/25/2015. FINDINGS: MRI THORACIC SPINE FINDINGS Alignment: Examination mildly limited as the patient was unable to tolerate the full length of the exam. Postcontrast axial sequences as well as post-contrast imaging through the lumbar spine was not performed. Stable alignment with preservation of the normal thoracic kyphosis. No listhesis. Vertebrae: Postoperative changes from prior fusion from T1 through T5 for T3 decompression again seen. Pathologic fracture deformity of T3 is relatively unchanged in appearance. Interval growth with progressive tumor seen within the adjacent spinal canal, extending from the mid aspect of T2 through the mid aspect of T4 (series 7, image 8). There has been marked interval worsening of cord and thecal sac progression, with the sac compressed laterally and slit like in appearance, measuring approximately 2 mm in transverse diameter at its most narrow point (series 10, image 12). Suspected cord signal abnormality at this level, although poorly visualized due to susceptibility artifact from adjacent hardware. Postoperative fluid collection again noted at the adjacent laminectomy site, likely slightly decreased in size. No other  metastatic lesions identified. Reactive marrow edema and enhancement about the right T12-L1 facet favored to be degenerative in nature (series 6, image 4). Vertebral body heights otherwise maintained. Mild discogenic reactive endplate changes present at the thoracolumbar junction. Cord:  No other cord signal abnormality identified. Paraspinal and other soft tissues: No other acute paraspinous soft tissue abnormality. Probable mild cholelithiasis noted. Visualized visceral structures otherwise unremarkable. Disc levels: T7-8: Central disc extrusion flattens the ventral  thoracic spinal cord and resultant moderate spinal stenosis, stable. Left paracentral disc protrusion at T8-9 with mild flattening of the left hemi cord also unchanged. MRI LUMBAR SPINE FINDINGS Segmentation: Normal segmentation. Lowest well-formed disc labeled the L5-S1 level. Alignment: Levoscoliosis. Vertebral bodies otherwise normally aligned with preservation of the normal lumbar lordosis. No listhesis. Vertebrae: Mild height loss with reactive endplate edema at the left aspect of the superior endplate of L4, suspicious for mild compression fracture (series 14, image 12). No bony retropulsion. This is benign in appearance, with no underlying lesion identified. No worrisome osseous lesions or evidence for metastatic disease identified within the lumbar spine. Prominent reactive marrow edema about the left L3-4 facet due to advanced facet degeneration. Conus medullaris: Extends to the L1 level and appears normal. Paraspinal and other soft tissues: Paraspinous soft tissues within normal limits. Visualized visceral structures unremarkable. Disc levels: L1-2:  Mild facet and ligament flavum hypertrophy.  No stenosis. L2-3: Minimal disc bulge with bilateral facet hypertrophy. No significant stenosis. L3-4: Disc desiccation with minimal disc bulge. Prominent anterior bridging osteophyte. Advanced bilateral facet degeneration with associated reactive  marrow edema, left greater than right. 7 mm synovial cyst noted at the posterior aspect of the right L3-4 facet. Mild canal with bilateral L3 foraminal stenosis. L4-5: Chronic intervertebral disc mild diffuse disc bulge. Moderate to advanced facet arthrosis, left greater than right. Resultant moderate canal with bilateral subarticular stenosis. Foramina remain patent. L5-S1: Chronic intervertebral disc space narrowing with diffuse disc bulge. Left greater than right facet degeneration. Resultant moderate to severe left lateral recess narrowing. Mild right with mild to moderate left L5 foraminal stenosis. IMPRESSION: MRI THORACIC SPINE IMPRESSION 1. 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 Neuro surgical consultation recommended. 2. Superimposed sequelae of prior T1 through T5 fusion for T3 decompression. 3. Stable disc protrusions at T7-8 and T8-9. 4. Prominent facet arthropathy at T12-L1 on the right with associated reactive marrow edema. MRI LUMBAR SPINE IMPRESSION 1. No evidence for metastatic disease within the lumbar spine. 2. Mild acute to subacute endplate compression deformity involving the left aspect of the superior endplate of L4. This is benign in appearance with no underlying pathologic lesion. 3. Advanced bilateral facet degeneration at L3-4 with associated reactive marrow edema, left greater than right. 4. Multilevel degenerative spondylolysis as above, most notable at L4-5 and L5-S1. Resultant moderate canal stenosis at L4-5, with moderate to severe left lateral recess narrowing at L5-S1. Critical Value/emergent results were called by telephone at the time of interpretation on 09/02/2017 at 8:30 pm to the ER PA Suarez , who verbally acknowledged these results. Electronically Signed   By: Jeannine Boga M.D.   On: 09/02/2017 20:31   Mr Thoracic Spine W Wo Contrast  Result Date:  09/02/2017 CLINICAL DATA:  Initial evaluation for increased lower extremity weakness with history of metastatic Hurthle cell carcinoma to the thoracic spine. T3 metastasis with history of prior decompression. EXAM: MRI THORACIC AND LUMBAR SPINE WITHOUT AND WITH CONTRAST TECHNIQUE: Multiplanar and multiecho pulse sequences of the thoracic and lumbar spine were obtained without and with intravenous contrast. CONTRAST:  85mL MULTIHANCE GADOBENATE DIMEGLUMINE 529 MG/ML IV SOLN COMPARISON:  Previous MRI from 12/25/2015. FINDINGS: MRI THORACIC SPINE FINDINGS Alignment: Examination mildly limited as the patient was unable to tolerate the full length of the exam. Postcontrast axial sequences as well as post-contrast imaging through the lumbar spine was not performed. Stable alignment with preservation  of the normal thoracic kyphosis. No listhesis. Vertebrae: Postoperative changes from prior fusion from T1 through T5 for T3 decompression again seen. Pathologic fracture deformity of T3 is relatively unchanged in appearance. Interval growth with progressive tumor seen within the adjacent spinal canal, extending from the mid aspect of T2 through the mid aspect of T4 (series 7, image 8). There has been marked interval worsening of cord and thecal sac progression, with the sac compressed laterally and slit like in appearance, measuring approximately 2 mm in transverse diameter at its most narrow point (series 10, image 12). Suspected cord signal abnormality at this level, although poorly visualized due to susceptibility artifact from adjacent hardware. Postoperative fluid collection again noted at the adjacent laminectomy site, likely slightly decreased in size. No other metastatic lesions identified. Reactive marrow edema and enhancement about the right T12-L1 facet favored to be degenerative in nature (series 6, image 4). Vertebral body heights otherwise maintained. Mild discogenic reactive endplate changes present at the  thoracolumbar junction. Cord:  No other cord signal abnormality identified. Paraspinal and other soft tissues: No other acute paraspinous soft tissue abnormality. Probable mild cholelithiasis noted. Visualized visceral structures otherwise unremarkable. Disc levels: T7-8: Central disc extrusion flattens the ventral thoracic spinal cord and resultant moderate spinal stenosis, stable. Left paracentral disc protrusion at T8-9 with mild flattening of the left hemi cord also unchanged. MRI LUMBAR SPINE FINDINGS Segmentation: Normal segmentation. Lowest well-formed disc labeled the L5-S1 level. Alignment: Levoscoliosis. Vertebral bodies otherwise normally aligned with preservation of the normal lumbar lordosis. No listhesis. Vertebrae: Mild height loss with reactive endplate edema at the left aspect of the superior endplate of L4, suspicious for mild compression fracture (series 14, image 12). No bony retropulsion. This is benign in appearance, with no underlying lesion identified. No worrisome osseous lesions or evidence for metastatic disease identified within the lumbar spine. Prominent reactive marrow edema about the left L3-4 facet due to advanced facet degeneration. Conus medullaris: Extends to the L1 level and appears normal. Paraspinal and other soft tissues: Paraspinous soft tissues within normal limits. Visualized visceral structures unremarkable. Disc levels: L1-2:  Mild facet and ligament flavum hypertrophy.  No stenosis. L2-3: Minimal disc bulge with bilateral facet hypertrophy. No significant stenosis. L3-4: Disc desiccation with minimal disc bulge. Prominent anterior bridging osteophyte. Advanced bilateral facet degeneration with associated reactive marrow edema, left greater than right. 7 mm synovial cyst noted at the posterior aspect of the right L3-4 facet. Mild canal with bilateral L3 foraminal stenosis. L4-5: Chronic intervertebral disc mild diffuse disc bulge. Moderate to advanced facet arthrosis,  left greater than right. Resultant moderate canal with bilateral subarticular stenosis. Foramina remain patent. L5-S1: Chronic intervertebral disc space narrowing with diffuse disc bulge. Left greater than right facet degeneration. Resultant moderate to severe left lateral recess narrowing. Mild right with mild to moderate left L5 foraminal stenosis. IMPRESSION: MRI THORACIC SPINE IMPRESSION 1. 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 Neuro surgical consultation recommended. 2. Superimposed sequelae of prior T1 through T5 fusion for T3 decompression. 3. Stable disc protrusions at T7-8 and T8-9. 4. Prominent facet arthropathy at T12-L1 on the right with associated reactive marrow edema. MRI LUMBAR SPINE IMPRESSION 1. No evidence for metastatic disease within the lumbar spine. 2. Mild acute to subacute endplate compression deformity involving the left aspect of the superior endplate of L4. This is benign in appearance with no underlying pathologic lesion. 3. Advanced bilateral facet degeneration  at L3-4 with associated reactive marrow edema, left greater than right. 4. Multilevel degenerative spondylolysis as above, most notable at L4-5 and L5-S1. Resultant moderate canal stenosis at L4-5, with moderate to severe left lateral recess narrowing at L5-S1. Critical Value/emergent results were called by telephone at the time of interpretation on 09/02/2017 at 8:30 pm to the ER PA [place] , who verbally acknowledged these results. Electronically Signed   By: Jeannine Boga M.D.   On: 09/02/2017 20:31   US Abdomen Complete  Result Date: 08/10/2017 CLINICAL DATA:  Elevated bilirubin level. EXAM: ABDOMEN ULTRASOUND COMPLETE COMPARISON:  CT scan of Aug 09, 2017. FINDINGS: Gallbladder: Multiple gallstones are noted with the largest measuring 1.1 cm. No gallbladder wall thickening or pericholecystic fluid is noted. No  sonographic Murphy's sign is noted. Common bile duct: Diameter: 2.8 mm which is within normal limits. Liver: No focal lesion identified. Within normal limits in parenchymal echogenicity. Portal vein is patent on color Doppler imaging with normal direction of blood flow towards the liver. IVC: No abnormality visualized. Pancreas: Not well visualized due to overlying bowel gas. Spleen: Size and appearance within normal limits. Right Kidney: Length: 11 cm. Echogenicity within normal limits. No mass or hydronephrosis visualized. Left Kidney: Length: 10.2 cm. Echogenicity within normal limits. No mass or hydronephrosis visualized. Abdominal aorta: No aneurysm visualized. Other findings: None. IMPRESSION: Cholelithiasis without inflammation. Pancreas not well visualized due to overlying bowel gas. No other abnormality seen in the abdomen. Electronically Signed   By: Marijo Conception, M.D.   On: 08/10/2017 14:32   Nm Hepato W/eject Fract  Result Date: 08/11/2017 CLINICAL DATA:  RIGHT upper quadrant pain for few days EXAM: NUCLEAR MEDICINE HEPATOBILIARY IMAGING WITH GALLBLADDER EF TECHNIQUE: Sequential images of the abdomen were obtained out to 60 minutes following intravenous administration of radiopharmaceutical. After slow intravenous infusion of 1.91 micrograms Cholecystokinin, gallbladder ejection fraction was determined. RADIOPHARMACEUTICALS:  5.5 mCi Tc-15m Choletec IV COMPARISON:  Ultrasound abdomen 08/10/2017 FINDINGS: Normal tracer extraction from bloodstream indicating normal hepatocellular function. Normal excretion of tracer into biliary tree. Gallbladder visualized at 25 min, though no additional tracer is seen within the gallbladder until 58 minutes. Minimal tracer within gallbladder prior to CCK administration. Small bowel visualized at 12 min. No hepatic retention of tracer. Subjectively very little to no emptying of tracer from gallbladder following CCK administration. Calculated gallbladder ejection  fraction is likely inaccurate due to superimposed small bowel loops with the gallbladder near the conclusion of the post CCK images. Patient reported no symptoms following CCK administration. Normal gallbladder ejection fraction following CCK stimulation is greater than 40% at 1 hour. IMPRESSION: Patent biliary tree. Suboptimal filling of gallbladder prior to CCK with limited assessment of gallbladder emptying post CCK due to superimposed bowel and poor filling, unable to generate an accurate gallbladder ejection fraction but subjectively there is very poor/little emptying following CCK stimulation. Electronically Signed   By: Lavonia Dana M.D.   On: 08/11/2017 13:13       IMPRESSION/PLAN: 1. 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 The patient's case has been reviewed and discussed with Dr. [name], Dr. Christella Noa, and [name]. I spent time today reviewing the findings to date and reviewing with the patient and the recommendations to give Dr. Christella Noa a chance to review her films. Although I'm not sure if he will recommend surgical decompression, we will follow up with the patient today to determine her course. She is in a difficult position due to her  disease, however if this is the only site of progression, and if she is able to gain local control with either surgery or radiotherapy, she could have good quantity of life. The patient acknowledges her understanding of the fact that despite these efforts she may remain paralyzed.  We discussed the risks, benefits, short, and long term effects of radiotherapy in the setting of re-irradiation, and she is willing to take on either risk, surgical or radiotherapy induced. I will follow up with her later today to determine our course.  In a visit lasting 70 minutes, greater than 50% of the time was spent discussing her case in floor time, and coordinating the patient's care.      Carola Rhine, PAC

## 2017-09-03 NOTE — Anesthesia Procedure Notes (Signed)
Procedure Name: Intubation Date/Time: 09/03/2017 7:04 PM Performed by: Teressa Lower., CRNA Pre-anesthesia Checklist: Patient identified, Emergency Drugs available, Suction available and Patient being monitored Patient Re-evaluated:Patient Re-evaluated prior to induction Oxygen Delivery Method: Circle system utilized Preoxygenation: Pre-oxygenation with 100% oxygen Induction Type: IV induction Ventilation: Mask ventilation without difficulty and Oral airway inserted - appropriate to patient size Laryngoscope Size: Sabra Heck and 2 Grade View: Grade I Tube type: Oral Tube size: 7.0 mm Number of attempts: 1 Airway Equipment and Method: Stylet and Oral airway Placement Confirmation: ETT inserted through vocal cords under direct vision,  positive ETCO2 and breath sounds checked- equal and bilateral Secured at: 22 cm Tube secured with: Tape Dental Injury: Teeth and Oropharynx as per pre-operative assessment

## 2017-09-03 NOTE — Anesthesia Postprocedure Evaluation (Signed)
Anesthesia Post Note  Patient: Sarah [name]  Procedure(s) Performed: POSTERIOR SPINAL TUMOR RESECTION THORACIC THREE (N/A Spine Thoracic)     Patient location during evaluation: PACU Anesthesia Type: General Level of consciousness: awake and alert Pain management: pain level controlled Vital Signs Assessment: post-procedure vital signs reviewed and stable Respiratory status: spontaneous breathing, nonlabored ventilation and respiratory function stable Cardiovascular status: blood pressure returned to baseline and stable Postop Assessment: no apparent nausea or vomiting Anesthetic complications: no    Last Vitals:  Vitals:   09/03/17 2140 09/03/17 2155  BP: (!) 99/56 106/64  Pulse: 89 94  Resp: 16 17  Temp:    SpO2: 99% 99%    Last Pain:  Vitals:   09/03/17 2155  TempSrc:   PainSc: 0-No pain    LLE Motor Response: Non-purposeful movement (09/03/17 2155)   RLE Motor Response: Non-purposeful movement (09/03/17 2155)        Donald Memoli,W. EDMOND

## 2017-09-03 NOTE — Progress Notes (Signed)
LLE venous duplex prelim: Technically difficult study due to patient's leg involuntarily moving and contracting during venous compressions. No obvious evidence of DVT noted.  Landry Mellow, RDMS, RVT

## 2017-09-03 NOTE — Progress Notes (Signed)
[facility]  Patient Status: Active (receiving services up to time of hospitalization)  [facility] is providing the following services: RN  If patient discharges after hours, please call [phone].   Sarah [name] 09/03/2017, 10:46 AM

## 2017-09-03 NOTE — Consult Note (Deleted)
Hayden Pedro, PA-C      Hayden Pedro, [place]  Physician Assistant Certified  Radiation Oncology      Consult Note  Incomplete Revision     Date of Service:  09/03/2017  9:07 AM               This incomplete note is preventing users from acting on the original note.           Incomplete Revision          Expand All Collapse All            Expand widget buttonCollapse widget button    Show:Clear all   ManualTemplateCopied  Added by:     Hayden Pedro, PA-C   Hover for detailscustomization button                                                                                                    Radiation Oncology         [id number]) [phone] ________________________________  Name: Sarah [name]        MRN: [id number]  Date of Service: 09/03/17  DOB: [birth year]  [id number], Earna Coder, MD  No ref. provider found      DIAGNOSIS: The primary encounter diagnosis was Spinal cord compression due to malignant neoplasm metastatic to spine [facility]). Diagnoses of Fall, initial encounter, Acute pain of right shoulder, Neck pain, Leg weakness, bilateral, Chronic anemia, Essential hypertension, Lower urinary tract infectious disease, and Metastasis to spinal column [facility]) were also pertinent to this visit.   HISTORY OF PRESENT ILLNESS: Sarah [name] is a 52 y.o. female seen at the request of Dr. Ronnald Ramp for a history of metastatic thyroid cancer. She was originally diagnosed in 2015 when she had a mass in the T3 vertebral body causing cord compression and a 7.2 x 5.2 x 11.2 cm mass in the left thyroid. She was decompressed surgically with Dr. Christella Noa on 12/14/13 and final pathology revealed carcinoma most consistent with thyroid primary. She underwent thyroidectomy as well on 01/09/14 which revealed Hurthle cell  carcinoma. She went on to receive 30 Gy to C7-T6 with Dr. [name] which she completed on 01/31/14. The patient made a tremendous recover and regained her ability to walk about 6 months after surgery. Her course was prolonged but she has had an excellent quality of life as a result. Unfortunately she was found to have recurrent disease at T3 vertebral body in the spring of 2017 when she presented with cord compression again. There was discussion of re-irradiation versus surgical decompression which she  underwent with Dr. Christella Noa on 07/05/15. She again had a remarkable recovery and regained use of her legs, and her continence. She had been able to walk with a walker, and stand up completely unassisted until about 2 1/2 weeks ago when she started noticing increasing lower extremity weakness. She had an appointment last Monday with [name] and she was offered admission to work up her symptoms versus outpatient  MRI. She elected for outpatient MRI, but yesterday had an episode of progressive weakness and fell. She was brought by EMS to the hospital overnight and CT of the head and C spine were negative for acute changes. Her MRI of the thoracic and lumbar spine yesterday evening reveal concerns for interval worsening of the T3 lesion with worsened mass effect on the thecal sac and cord compression. there was stable changes from prior instrumentation, and in the lumbar spine no metastatic changes. She was seen by Dr. Ronnald Ramp and he did not feel that she would benefit overnight from decompression, but wanted to discuss her case with Dr. Christella Noa and requested radiation oncology input to consider re-irradiation.    PREVIOUS RADIATION THERAPY: Yes   01/18/14-01/31/14: 30 Gy in 10 fractions from C7-T6  Radioactive Iodine in 2015 though she did not respond to this therapy.     Past Medical History:  Past Medical History:  Diagnosis Date  . Cancer [facility])    FNA of thyroid positive for onconytic/hurthle cell carcinoma   . Chronic back pain   . DDD (degenerative disc disease), cervical   . DJD (degenerative joint disease)   . History of rectal fissure   . HIT (heparin-induced thrombocytopenia) ([place])   . Hypertension   . Obesity   . Paraplegia at T4 level ([place])   . Pulmonary embolus, right ([place]) 2015  . Rotator cuff tendonitis right    Past Surgical History: Past Surgical History:  Procedure Laterality Date  . LAMINECTOMY N/A 12/14/2013   Procedure: THORACIC LAMINECTOMY FOR TUMOR THORACIC THREE;  Surgeon: Ashok Pall, MD;  Location: [place] NEURO ORS;  Service: Neurosurgery;  Laterality: N/A;  THORACIC LAMINECTOMY FOR TUMOR THORACIC THREE  . LAMINECTOMY N/A 07/05/2015   Procedure: THORACIC LAMINECTOMY FOR TUMOR;  Surgeon: Ashok Pall, MD;  Location: [place] NEURO ORS;  Service: Neurosurgery;  Laterality: N/A;  . POSTERIOR LUMBAR FUSION 4 LEVEL N/A 12/30/2013   Procedure: Thoracic one-Thoracic five posterior thoracic fusion with pedicle screws;  Surgeon: Ashok Pall, MD;  Location: [place] NEURO ORS;  Service: Neurosurgery;  Laterality: N/A;  Thoracic one-Thoracic five posterior thoracic fusion with pedicle screws  . THYROIDECTOMY N/A 01/09/2014   Procedure: TOTAL THYROIDECTOMY;  Surgeon: Izora Gala, MD;  [facility];  Service: ENT;  Laterality: N/A;  . TONSILLECTOMY      Social History:  Social History   Socioeconomic History  . Marital status: Single    Spouse name: Not on file  . Number of children: Not on file  . Years of education: Not on file  . Highest education level: Not on file  Occupational History  . Not on file  Social Needs  . Financial resource strain: Not on file  . Food insecurity:    Worry: Not on file    Inability: Not on file  . Transportation needs:    Medical: Not on file    Non-medical: Not on file  Tobacco Use  . Smoking status: Never Smoker  . Smokeless tobacco: Never Used  Substance and Sexual Activity  . Alcohol use: No  . Drug use: No  . Sexual activity: Not  Currently  Lifestyle  . Physical activity:    Days per week: Not on file    Minutes per session: Not on file  . Stress: Not on file  Relationships  . Social connections:    Talks on phone: Not on file    Gets together: Not on file    Attends religious service: Not on file  Active member of club or organization: Not on file    Attends meetings of clubs or organizations: Not on file    Relationship status: Not on file  . Intimate partner violence:    Fear of current or ex partner: Not on file    Emotionally abused: Not on file    Physically abused: Not on file    Forced sexual activity: Not on file  Other Topics Concern  . Not on file  Social History Narrative  . Not on file    Family History: Family History  Problem Relation Age of Onset  . Hypertension Mother   . Diabetes Mother   . Hypertension Father   . Stroke Father   . Diabetes Father   . Diabetes Sister   . Diabetes Sister      ALLERGIES: Bee venom; Keflex [cephalexin]; Tramadol; Gabapentin; Heparin; Hydrochlorothiazide; Hydrocodone; and Oxycodone-acetaminophen      MEDICATIONS:  Current Facility-Administered Medications  Medication Dose Route Frequency Provider Last Rate Last Dose  . baclofen (LIORESAL) tablet 20 mg  20 mg Oral TID Emokpae, Ejiroghene E, MD   20 mg at 09/03/17 1131  . cefTRIAXone (ROCEPHIN) 2 g in sodium chloride 0.9 % 100 mL IVPB  2 g Intravenous QHS Emokpae, Ejiroghene E, MD   Stopped at 09/03/17 0150  . dexamethasone (DECADRON) injection 4 mg  4 mg Intravenous Q6H Emokpae, Ejiroghene E, MD   4 mg at 09/03/17 1139  . fluticasone (FLONASE) 50 MCG/ACT nasal spray 2 spray  2 spray Each Nare QHS Minda Ditto, RPH      . fondaparinux (ARIXTRA) injection 7.5 mg  7.5 mg Subcutaneous Q0600 Emokpae, Ejiroghene E, MD   7.5 mg at 09/03/17 0110  . HYDROcodone-acetaminophen (NORCO/VICODIN) 5-325 MG per tablet 1-2 tablet  1-2 tablet Oral Q4H PRN Emokpae, Ejiroghene E, MD   1 tablet at 09/03/17 0404    . levothyroxine (SYNTHROID, LEVOTHROID) tablet 137 mcg  137 mcg Oral QAC breakfast Emokpae, Ejiroghene E, MD   137 mcg at 09/03/17 1122  . lisinopril (PRINIVIL,ZESTRIL) tablet 40 mg  40 mg Oral Daily Emokpae, Ejiroghene E, MD   40 mg at 09/03/17 1128  . NIFEdipine (PROCARDIA-XL/ADALAT-CC/NIFEDICAL-XL) 24 hr tablet 30 mg  30 mg Oral Daily Emokpae, Ejiroghene E, MD      . ondansetron (ZOFRAN) tablet 4 mg  4 mg Oral Q6H PRN Emokpae, Ejiroghene E, MD       Or  . ondansetron (ZOFRAN) injection 4 mg  4 mg Intravenous Q6H PRN Emokpae, Ejiroghene E, MD      . pantoprazole (PROTONIX) EC tablet 40 mg  40 mg Oral Daily Emokpae, Ejiroghene E, MD   40 mg at 09/03/17 1127  . senna (SENOKOT) tablet 17.2 mg  2 tablet Oral Daily Emokpae, Ejiroghene E, MD   17.2 mg at 09/03/17 1124  . sertraline (ZOLOFT) tablet 25 mg  25 mg Oral Daily Emokpae, Ejiroghene E, MD   25 mg at 09/03/17 1132  . traZODone (DESYREL) tablet 50 mg  50 mg Oral QHS PRN Emokpae, Ejiroghene E, MD   50 mg at 09/03/17 0101     REVIEW OF SYSTEMS: On review of systems, the patient reports that she is doing okay but has been concerned that she has not been able to move her lower extremities much at all. She is on Dexamethasone 4 mg q 6 hours. She denies any chest pain, shortness of breath, cough, fevers, chills, night sweats, unintended weight changes. She denies any control of  her of  bowel or bladder. In retrospect she has had some sharp shooting pain in her upper chest wall and right shoulder. She denies abdominal pain, nausea or vomiting. She denies any new musculoskeletal or joint aches or pains. A complete review of systems is obtained and is otherwise negative.    PHYSICAL EXAM:  Wt Readings from Last 3 Encounters:  09/02/17 218 lb 4.1 oz (99 kg)  08/09/17 211 lb 10.3 oz (96 kg)  05/28/17 208 lb (94.3 kg)   Temp Readings from Last 3 Encounters:  09/03/17 98.9 F (37.2 C) (Oral)  08/24/17 98.6 F (37 C) (Oral)  08/21/17 98 F (36.7  C) (Oral)   BP Readings from Last 3 Encounters:  09/03/17 (!) 107/59  08/24/17 118/90  08/21/17 98/78   Pulse Readings from Last 3 Encounters:  09/03/17 88  08/24/17 93  08/21/17 89    In general this is a well appearing caucasian female in no acute distress. She is alert and oriented x4 and appropriate throughout the examination. HEENT reveals that the patient is normocephalic, atraumatic. EOMs are intact. PERRLA. Skin is intact without any evidence of gross lesions of her upper extremities. She has several eschars of her lower extremities bilaterally. Cardiopulmonary assessment is negative for acute distress and she exhibits normal effort. Lower extremities are negative for deep calf tenderness, cyanosis or clubbing, but she is unable to detect sensation to light touch over bilateral lower extremities, and is unable to lift her legs.     ECOG = 4     0 - Asymptomatic (Fully active, able to carry on all predisease activities without restriction)     1 - Symptomatic but completely ambulatory (Restricted in physically strenuous activity but ambulatory and able to carry out work of a light or sedentary nature. For example, light housework, office work)     2 - Symptomatic, <50% in bed during the day (Ambulatory and capable of all self care but unable to carry out any work activities. Up and about more than 50% of waking hours)     3 - Symptomatic, >50% in bed, but not bedbound (Capable of only limited self-care, confined to bed or chair 50% or more of waking hours)     4 - Bedbound (Completely disabled. Cannot carry on any self-care. Totally confined to bed or chair)     5 - Death      Eustace Pen MM, Creech RH, Tormey DC, et al. [phone]). "Toxicity and response criteria of the [facility] Group". [place] Oncol. 5 (6): 649-55        IMPRESSION/PLAN: 1.         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 The patient's case has  been reviewed and discussed with Dr. [name], Dr. Christella Noa, and [name]. I spent time today reviewing the findings to date and reviewing with the patient and the recommendations to give Dr. Christella Noa a chance to review her films. Although I'm not sure if he will recommend surgical decompression, we will follow up with the patient today to determine her course. She is in a difficult position due to her disease, however if this is the only site of progression, and if she is able to gain local control with either surgery or radiotherapy, she could have good quantity of life. The patient acknowledges her understanding of the fact that despite these efforts she may remain paralyzed.  We discussed the risks, benefits, short, and long term effects  of radiotherapy in the setting of re-irradiation, and she is willing to take on either risk, surgical or radiotherapy induced. I will follow up with her later today to determine our course.     In a visit lasting 70 minutes, greater than 50% of the time was spent discussing her case in floor time, and coordinating the patient's care.       Carola Rhine, PAC

## 2017-09-03 NOTE — Progress Notes (Signed)
HEMATOLOGY/ONCOLOGY INPATIENT PROGRESS NOTE  Date of Service: 09/03/2017  Inpatient Attending: .Rama, Venetia Maxon, MD   SUBJECTIVE:   [name] was seen at [facility] at the request of Dr Rockne Menghini. She was admitted with increased b/l lower extremity weakness and MRI shows recurrent cord compression at T3. Senn by radiation oncology -- I dicussed her case with them -- no role of rpt RTto this region given previous h/o RT. Neurosurgery has been consulted.  The pt reports that she has spoken to [facility] and she will be having surgery today at 5pm. She notes that her appetite has not been very strong amidst her many medical concerns recently.   Both of her legs continue to be immobile.   She has begun antibiotics for her UTI. She stopped Xarelto on Sunday June 9 in anticipation of a possible surgery.   OBJECTIVE:  NAD. Somewhat emotional as is understandable.  PHYSICAL EXAMINATION: . Vitals:   09/03/17 0130 09/03/17 0524 09/03/17 1100 09/03/17 1330  BP: 123/80 99/63 108/71 (!) 107/59  Pulse: (!) 101 92 96 88  Resp: 18 16  16   Temp: 98.4 F (36.9 C) 99 F (37.2 C)  98.9 F (37.2 C)  TempSrc: Oral Oral  Oral  SpO2: 97% 96% 98% 98%  Weight:      Height:       Filed Weights   09/02/17 2300  Weight: 218 lb 4.1 oz (99 kg)   .Body mass index is 30.44 kg/m.  GENERAL:alert, in no acute distress and comfortable SKIN: skin color, texture, turgor are normal, no rashes or significant lesions EYES: normal, conjunctiva are pink and non-injected, sclera clear OROPHARYNX:no exudate, no erythema and lips, buccal mucosa, and tongue normal  NECK: supple, no JVD, thyroid normal size, non-tender, without nodularity LYMPH:  no palpable lymphadenopathy in the cervical, axillary or inguinal LUNGS: clear to auscultation with normal respiratory effort HEART: regular rate & rhythm,  no murmurs and no lower extremity edema ABDOMEN: abdomen soft, non-tender, normoactive bowel sounds    Musculoskeletal: no cyanosis of digits and no clubbing  PSYCH: alert & oriented x 3 with fluent speech NEURO: no focal motor/sensory deficits  MEDICAL HISTORY:  Past Medical History:  Diagnosis Date  . 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 Chronic back pain   . DDD (degenerative disc disease), cervical   . DJD (degenerative joint disease)   . History of rectal fissure   . HIT (heparin-induced thrombocytopenia) ([place])   . Hypertension   . Obesity   . 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 Pulmonary embolus, right ([place]) 2015  . Rotator cuff tendonitis right    SURGICAL HISTORY: Past Surgical History:  Procedure Laterality Date  . LAMINECTOMY N/A 12/14/2013   Procedure: THORACIC LAMINECTOMY FOR TUMOR THORACIC THREE;  Surgeon: Ashok Pall, MD;  Location: [place] NEURO ORS;  Service: Neurosurgery;  Laterality: N/A;  THORACIC LAMINECTOMY FOR TUMOR THORACIC THREE  . LAMINECTOMY N/A 07/05/2015   Procedure: THORACIC LAMINECTOMY FOR TUMOR;  Surgeon: Ashok Pall, MD;  Location: [place] NEURO ORS;  Service: Neurosurgery;  Laterality: N/A;  . POSTERIOR LUMBAR FUSION 4 LEVEL N/A 12/30/2013   Procedure: Thoracic one-Thoracic five posterior thoracic fusion with pedicle screws;  Surgeon: Ashok Pall, MD;  Location: [place] NEURO ORS;  Service: Neurosurgery;  Laterality: N/A;  Thoracic one-Thoracic five posterior thoracic fusion with pedicle screws  . THYROIDECTOMY N/A 01/09/2014   Procedure: TOTAL THYROIDECTOMY;  Surgeon: Izora Gala, MD;  Location: [facility] OR;  Service: ENT;  Laterality: N/A;  . TONSILLECTOMY      SOCIAL HISTORY: Social History   Socioeconomic History  . Marital status: Single    Spouse name: Not on file  . Number of children: Not on file  . Years of education: Not on file  . Highest education level: Not on file  Occupational History  . Not on file  Social Needs  . Financial resource strain: Not on file  . Food insecurity:    Worry: Not on file     Inability: Not on file  . Transportation needs:    Medical: Not on file    Non-medical: Not on file  Tobacco Use  . Smoking status: Never Smoker  . Smokeless tobacco: Never Used  Substance and Sexual Activity  . Alcohol use: No  . Drug use: No  . Sexual activity: Not Currently  Lifestyle  . Physical activity:    Days per week: Not on file    Minutes per session: Not on file  . Stress: Not on file  Relationships  . Social connections:    Talks on phone: Not on file    Gets together: Not on file    Attends religious service: Not on file    Active member of club or organization: Not on file    Attends meetings of clubs or organizations: Not on file    Relationship status: Not on file  . Intimate partner violence:    Fear of current or ex partner: Not on file    Emotionally abused: Not on file    Physically abused: Not on file    Forced sexual activity: Not on file  Other Topics Concern  . Not on file  Social History Narrative  . Not on file    FAMILY HISTORY: Family History  Problem Relation Age of Onset  . Hypertension Mother   . Diabetes Mother   . Hypertension Father   . Stroke Father   . Diabetes Father   . Diabetes Sister   . Diabetes Sister     ALLERGIES:  is allergic to bee venom; keflex [cephalexin]; tramadol; gabapentin; heparin; hydrochlorothiazide; hydrocodone; and oxycodone-acetaminophen.  MEDICATIONS:  Scheduled Meds: . baclofen  20 mg Oral TID  . dexamethasone  4 mg Intravenous Q6H  . fluticasone  2 spray Each Nare QHS  . fondaparinux (ARIXTRA) injection  7.5 mg Subcutaneous Q0600  . levothyroxine  137 mcg Oral QAC breakfast  . lisinopril  40 mg Oral Daily  . NIFEdipine  30 mg Oral Daily  . pantoprazole  40 mg Oral Daily  . senna  2 tablet Oral Daily  . sertraline  25 mg Oral Daily   Continuous Infusions: . cefTRIAXone (ROCEPHIN)  IV Stopped (09/03/17 0150)   PRN Meds:.HYDROcodone-acetaminophen, ondansetron **OR** ondansetron (ZOFRAN) IV,  traZODone  REVIEW OF SYSTEMS:    10 Point review of Systems was done is negative except as noted above.   LABORATORY DATA:  I have reviewed the data as listed  . CBC Latest Ref Rng & Units 09/02/2017 08/24/2017 08/21/2017  WBC 4.0 - 10.5 K/uL 5.8 6.2 4.5  Hemoglobin 12.0 - 15.0 g/dL 10.9(L) 10.8(L) 10.7(L)  Hematocrit 36.0 - 46.0 % 34.0(L) 33.2(L) 33.0(L)  Platelets 150 - 400 K/uL 190 320 328    . CMP Latest Ref Rng & Units 09/02/2017 08/24/2017 08/21/2017  Glucose 65 - 99 mg/dL 133(H) 112 106(H)  BUN 6 - 20 mg/dL 17 11 7   Creatinine 0.44 -  1.00 mg/dL 0.98 1.00 0.93  Sodium 135 - 145 mmol/L 141 143 145(H)  Potassium 3.5 - 5.1 mmol/L 4.0 4.1 4.4  Chloride 101 - 111 mmol/L 105 107 104  CO2 22 - 32 mmol/L 27 30(H) 28  Calcium 8.9 - 10.3 mg/dL 8.4(L) 8.4 8.3(L)  Total Protein 6.4 - 8.3 g/dL - 6.1(L) 5.4(L)  Total Bilirubin 0.2 - 1.2 mg/dL - 0.6 1.6(H)  Alkaline Phos 40 - 150 U/L - 328(H) 429(H)  AST 5 - 34 U/L - 39(H) 124(H)  ALT 0 - 55 U/L - 64(H) 158(H)     RADIOGRAPHIC STUDIES: I have personally reviewed the radiological images as listed and agreed with the findings in the report. Ct Abdomen Pelvis Wo Contrast  Result Date: 08/09/2017 CLINICAL DATA:  Per ED notes: Pt presents today for nausea x 2 days. Took zofran at home with no relief. Pt denies diarrhea. Pt take oral chemo for thyroid CA with mets. Pt denies being around anyone with similar symptoms. EXAM: CT ABDOMEN AND PELVIS WITHOUT CONTRAST TECHNIQUE: Multidetector CT imaging of the abdomen and pelvis was performed following the standard protocol without IV contrast. COMPARISON:  PET-CT 05/20/2017 and previous FINDINGS: Lower chest: Lower lobe pulmonary nodules up to 5 mm, stable since previous. No pleural or pericardial effusion. Hepatobiliary: Gallbladder is distended with small partially calcified stones in its dependent aspect. Unremarkable liver. Pancreas: Unremarkable. No pancreatic ductal dilatation or surrounding  inflammatory changes. Spleen: Normal in size without focal abnormality. Adrenals/Urinary Tract: Adrenal glands are unremarkable. Kidneys are normal, without renal calculi, focal lesion, or hydronephrosis. Bladder is unremarkable. Stomach/Bowel: Stomach is nondistended. Small bowel is nondilated. Colon is nondilated with a few scattered diverticula. Vascular/Lymphatic: No significant vascular findings are present. No enlarged abdominal or pelvic lymph nodes. Reproductive: Uterus and bilateral adnexa are unremarkable. Other: No ascites.  No free air. Musculoskeletal: Spondylitic changes throughout the lumbar spine. Bilateral hip degenerative spurring. No fracture or worrisome bone lesion. IMPRESSION: 1. No acute findings. 2. Stable small pulmonary nodules at the visualized lung bases. Electronically Signed   By: Lucrezia Europe M.D.   On: 08/09/2017 13:54   Dg Shoulder Right  Result Date: 09/02/2017 CLINICAL DATA:  Pain following fall EXAM: RIGHT SHOULDER - 2+ VIEW COMPARISON:  None. FINDINGS: Frontal and Y scapular images were obtained. There is no appreciable fracture or dislocation. There is moderate generalized osteoarthritic change. No erosive change or intra-articular calcification. Visualized right lung is clear. Postoperative changes present in the upper thoracic region. 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 No fracture or dislocation. Electronically Signed   By: Lowella Grip III M.D.   On: 09/02/2017 14:04   Ct Head Wo Contrast  Result Date: 09/02/2017 CLINICAL DATA:  Status post fall moving from a bed to a wheelchair today. Initial encounter. EXAM: CT HEAD WITHOUT CONTRAST CT CERVICAL SPINE WITHOUT CONTRAST TECHNIQUE: Multidetector CT imaging of the head and cervical spine was performed following the standard protocol without intravenous contrast. Multiplanar CT image reconstructions of the cervical spine were also generated. COMPARISON:  Head CT scan 12/22/2016.   PET CT scan 05/20/2017. FINDINGS: CT HEAD FINDINGS Brain: No evidence of acute infarction, hemorrhage, hydrocephalus, extra-axial collection or mass lesion/mass effect. Vascular: A few atherosclerotic calcifications are seen. Skull: Intact.  No focal lesion. Sinuses/Orbits: Mild mucosal thickening is noted in the maxillary sinuses, frontal sinuses, sphenoid sinus and scattered ethmoid air cells. Other: None. CT CERVICAL SPINE FINDINGS Alignment: Maintained. Skull base and vertebrae: No acute fracture. No primary  bone lesion or focal pathologic process. Soft tissues and spinal canal: No prevertebral fluid or swelling. No visible canal hematoma. Disc levels: Intervertebral disc space height is maintained. There is some endplate spurring most notable at C3-4 and C5-6. Partial visualization of thoracic fusion hardware noted. Upper chest: 0.5 cm left upper lobe pulmonary nodule is seen on prior PET CT scan is noted. Other: None. IMPRESSION: No acute abnormality head or cervical spine. Atherosclerosis. Sinus disease. Mild appearing cervical spondylosis. No change in 0.5 cm left upper lobe pulmonary nodule consistent with the patient's known metastatic thyroid carcinoma. Electronically Signed   By: Inge Rise M.D.   On: 09/02/2017 14:37   Ct Cervical Spine Wo Contrast  Result Date: 09/02/2017 CLINICAL DATA:  Status post fall moving from a bed to a wheelchair today. Initial encounter. EXAM: CT HEAD WITHOUT CONTRAST CT CERVICAL SPINE WITHOUT CONTRAST TECHNIQUE: Multidetector CT imaging of the head and cervical spine was performed following the standard protocol without intravenous contrast. Multiplanar CT image reconstructions of the cervical spine were also generated. COMPARISON:  Head CT scan 12/22/2016.  PET CT scan 05/20/2017. FINDINGS: CT HEAD FINDINGS Brain: No evidence of acute infarction, hemorrhage, hydrocephalus, extra-axial collection or mass lesion/mass effect. Vascular: A few atherosclerotic  calcifications are seen. Skull: Intact.  No focal lesion. Sinuses/Orbits: Mild mucosal thickening is noted in the maxillary sinuses, frontal sinuses, sphenoid sinus and scattered ethmoid air cells. Other: None. CT CERVICAL SPINE FINDINGS Alignment: Maintained. Skull base and vertebrae: No acute fracture. No primary bone lesion or focal pathologic process. Soft tissues and spinal canal: No prevertebral fluid or swelling. No visible canal hematoma. Disc levels: Intervertebral disc space height is maintained. There is some endplate spurring most notable at C3-4 and C5-6. Partial visualization of thoracic fusion hardware noted. Upper chest: 0.5 cm left upper lobe pulmonary nodule is seen on prior PET CT scan is noted. Other: None. IMPRESSION: No acute abnormality head or cervical spine. Atherosclerosis. Sinus disease. Mild appearing cervical spondylosis. No change in 0.5 cm left upper lobe pulmonary nodule consistent with the patient's known metastatic thyroid carcinoma. Electronically Signed   By: Inge Rise M.D.   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 Worsening disease with cord compression on recent MRI. EXAM: CT THORACIC SPINE WITHOUT CONTRAST TECHNIQUE: Multidetector CT images of the thoracic were obtained using the standard protocol without intravenous contrast. COMPARISON:  Thoracic spine MRI 09/02/2017 FINDINGS: Alignment: Mildly exaggerated upper thoracic kyphosis. No listhesis. Vertebrae: Prior posterior fusion from T1-T5. Bilateral pedicle screws at each level except for T3. The screws appear intact without evidence of loosening about the T1, T2, or T5 screws. There is lucency throughout the posterosuperior aspect of the T4 vertebral body including about the distal portions of both T4 screws reflecting tumor as seen on MRI. There is a chronic,  severe to pathologic T3 compression fracture. Extensive epidural tumor centered at the T3 level was more fully evaluated on yesterday's MRI. Tumor is again noted to involve the posterior and inferior aspects of the T2 vertebral body. There is slight chronic anterior wedging of the T8 vertebral body. No definite acute fracture is identified. Flowing anterior vertebral osteophytes are present throughout the thoracic spine extending into the lumbar spine. Paraspinal and other soft tissues: Unremarkable. Disc levels: Spondylosis more fully evaluated on yesterday's MRI, with a small calcified disc extrusion noted at T7-8. IMPRESSION:  1. Metastatic disease as described on yesterday's MRI with extensive osseous and epidural tumor from T2-T4. Chronic pathologic T3 compression fracture. 2. Prior T1-T5 posterior fusion. Tumor involves the T4 vertebral body about the distal aspects of both pedicle screws. Electronically Signed   By: Logan Bores M.D.   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 T3 metastasis with history of prior decompression. EXAM: MRI THORACIC AND LUMBAR SPINE WITHOUT AND WITH CONTRAST TECHNIQUE: Multiplanar and multiecho pulse sequences of the thoracic and lumbar spine were obtained without and with intravenous contrast. CONTRAST:  33mL MULTIHANCE GADOBENATE DIMEGLUMINE 529 MG/ML IV SOLN COMPARISON:  Previous MRI from 12/25/2015. FINDINGS: MRI THORACIC SPINE FINDINGS Alignment: Examination mildly limited as the patient was unable to tolerate the full length of the exam. Postcontrast axial sequences as well as post-contrast imaging through the lumbar spine was not performed. Stable alignment with preservation of the normal thoracic kyphosis. No listhesis. Vertebrae: Postoperative changes from prior fusion from T1 through T5  for T3 decompression again seen. Pathologic fracture deformity of T3 is relatively unchanged in appearance. Interval growth with progressive tumor seen within the adjacent spinal canal, extending from the mid aspect of T2 through the mid aspect of T4 (series 7, image 8). There has been marked interval worsening of cord and thecal sac progression, with the sac compressed laterally and slit like in appearance, measuring approximately 2 mm in transverse diameter at its most narrow point (series 10, image 12). Suspected cord signal abnormality at this level, although poorly visualized due to susceptibility artifact from adjacent hardware. Postoperative fluid collection again noted at the adjacent laminectomy site, likely slightly decreased in size. No other metastatic lesions identified. Reactive marrow edema and enhancement about the right T12-L1 facet favored to be degenerative in nature (series 6, image 4). Vertebral body heights otherwise maintained. Mild discogenic reactive endplate changes present at the thoracolumbar junction. Cord:  No other cord signal abnormality identified. Paraspinal and other soft tissues: No other acute paraspinous soft tissue abnormality. Probable mild cholelithiasis noted. Visualized visceral structures otherwise unremarkable. Disc levels: T7-8: Central disc extrusion flattens the ventral thoracic spinal cord and resultant moderate spinal stenosis, stable. Left paracentral disc protrusion at T8-9 with mild flattening of the left hemi cord also unchanged. MRI LUMBAR SPINE FINDINGS Segmentation: Normal segmentation. Lowest well-formed disc labeled the L5-S1 level. Alignment: Levoscoliosis. Vertebral bodies otherwise normally aligned with preservation of the normal lumbar lordosis. No listhesis. Vertebrae: Mild height loss with reactive endplate edema at the left aspect of the superior endplate of L4, suspicious for mild compression fracture (series 14, image 12). No bony retropulsion. This  is benign in appearance, with no underlying lesion identified. No worrisome osseous lesions or evidence for metastatic disease identified within the lumbar spine. Prominent reactive marrow edema about the left L3-4 facet due to advanced facet degeneration. Conus medullaris: Extends to the L1 level and appears normal. Paraspinal and other soft tissues: Paraspinous soft tissues within normal limits. Visualized visceral structures unremarkable. Disc levels: L1-2:  Mild facet and ligament flavum hypertrophy.  No stenosis. L2-3: Minimal disc bulge with bilateral facet hypertrophy. No significant stenosis. L3-4: Disc desiccation with minimal disc bulge. Prominent anterior bridging osteophyte. Advanced bilateral facet degeneration with associated reactive marrow edema, left greater than right. 7 mm synovial cyst noted at the posterior aspect of the right L3-4 facet. Mild  canal with bilateral L3 foraminal stenosis. L4-5: Chronic intervertebral disc mild diffuse disc bulge. Moderate to advanced facet arthrosis, left greater than right. Resultant moderate canal with bilateral subarticular stenosis. Foramina remain patent. L5-S1: Chronic intervertebral disc space narrowing with diffuse disc bulge. Left greater than right facet degeneration. Resultant moderate to severe left lateral recess narrowing. Mild right with mild to moderate left L5 foraminal stenosis. IMPRESSION: MRI THORACIC SPINE IMPRESSION 1. 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 Neuro surgical consultation recommended. 2. Superimposed sequelae of prior T1 through T5 fusion for T3 decompression. 3. Stable disc protrusions at T7-8 and T8-9. 4. Prominent facet arthropathy at T12-L1 on the right with associated reactive marrow edema. MRI LUMBAR SPINE IMPRESSION 1. No evidence for metastatic disease within the lumbar spine. 2. Mild acute to subacute endplate compression  deformity involving the left aspect of the superior endplate of L4. This is benign in appearance with no underlying pathologic lesion. 3. Advanced bilateral facet degeneration at L3-4 with associated reactive marrow edema, left greater than right. 4. Multilevel degenerative spondylolysis as above, most notable at L4-5 and L5-S1. Resultant moderate canal stenosis at L4-5, with moderate to severe left lateral recess narrowing at L5-S1. Critical Value/emergent results were called by telephone at the time of interpretation on 09/02/2017 at 8:30 pm to the ER PA Foley , who verbally acknowledged these results. Electronically Signed   By: Jeannine Boga M.D.   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 EXAM: MRI THORACIC AND LUMBAR SPINE WITHOUT AND WITH CONTRAST TECHNIQUE: Multiplanar and multiecho pulse sequences of the thoracic and lumbar spine were obtained without and with intravenous contrast. CONTRAST:  5mL MULTIHANCE GADOBENATE DIMEGLUMINE 529 MG/ML IV SOLN COMPARISON:  Previous MRI from 12/25/2015. FINDINGS: MRI THORACIC SPINE FINDINGS Alignment: Examination mildly limited as the patient was unable to tolerate the full length of the exam. Postcontrast axial sequences as well as post-contrast imaging through the lumbar spine was not performed. Stable alignment with preservation of the normal thoracic kyphosis. No listhesis. Vertebrae: Postoperative changes from prior fusion from T1 through T5 for T3 decompression again seen. Pathologic fracture deformity of T3 is relatively unchanged in appearance. Interval growth with progressive tumor seen within the adjacent spinal canal, extending from the mid aspect of T2 through the mid aspect of T4 (series 7, image 8). There  has been marked interval worsening of cord and thecal sac progression, with the sac compressed laterally and slit like in appearance, measuring approximately 2 mm in transverse diameter at its most narrow point (series 10, image 12). Suspected cord signal abnormality at this level, although poorly visualized due to susceptibility artifact from adjacent hardware. Postoperative fluid collection again noted at the adjacent laminectomy site, likely slightly decreased in size. No other metastatic lesions identified. Reactive marrow edema and enhancement about the right T12-L1 facet favored to be degenerative in nature (series 6, image 4). Vertebral body heights otherwise maintained. Mild discogenic reactive endplate changes present at the thoracolumbar junction. Cord:  No other cord signal abnormality identified. Paraspinal and other soft tissues: No other acute paraspinous soft tissue abnormality. Probable mild cholelithiasis noted. Visualized visceral structures otherwise unremarkable. Disc levels: T7-8: Central disc extrusion flattens the ventral thoracic spinal cord  and resultant moderate spinal stenosis, stable. Left paracentral disc protrusion at T8-9 with mild flattening of the left hemi cord also unchanged. MRI LUMBAR SPINE FINDINGS Segmentation: Normal segmentation. Lowest well-formed disc labeled the L5-S1 level. Alignment: Levoscoliosis. Vertebral bodies otherwise normally aligned with preservation of the normal lumbar lordosis. No listhesis. Vertebrae: Mild height loss with reactive endplate edema at the left aspect of the superior endplate of L4, suspicious for mild compression fracture (series 14, image 12). No bony retropulsion. This is benign in appearance, with no underlying lesion identified. No worrisome osseous lesions or evidence for metastatic disease identified within the lumbar spine. Prominent reactive marrow edema about the left L3-4 facet due to advanced facet degeneration. Conus medullaris:  Extends to the L1 level and appears normal. Paraspinal and other soft tissues: Paraspinous soft tissues within normal limits. Visualized visceral structures unremarkable. Disc levels: L1-2:  Mild facet and ligament flavum hypertrophy.  No stenosis. L2-3: Minimal disc bulge with bilateral facet hypertrophy. No significant stenosis. L3-4: Disc desiccation with minimal disc bulge. Prominent anterior bridging osteophyte. Advanced bilateral facet degeneration with associated reactive marrow edema, left greater than right. 7 mm synovial cyst noted at the posterior aspect of the right L3-4 facet. Mild canal with bilateral L3 foraminal stenosis. L4-5: Chronic intervertebral disc mild diffuse disc bulge. Moderate to advanced facet arthrosis, left greater than right. Resultant moderate canal with bilateral subarticular stenosis. Foramina remain patent. L5-S1: Chronic intervertebral disc space narrowing with diffuse disc bulge. Left greater than right facet degeneration. Resultant moderate to severe left lateral recess narrowing. Mild right with mild to moderate left L5 foraminal stenosis. IMPRESSION: MRI THORACIC SPINE IMPRESSION 1. 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 Neuro surgical consultation recommended. 2. Superimposed sequelae of prior T1 through T5 fusion for T3 decompression. 3. Stable disc protrusions at T7-8 and T8-9. 4. Prominent facet arthropathy at T12-L1 on the right with associated reactive marrow edema. MRI LUMBAR SPINE IMPRESSION 1. No evidence for metastatic disease within the lumbar spine. 2. Mild acute to subacute endplate compression deformity involving the left aspect of the superior endplate of L4. This is benign in appearance with no underlying pathologic lesion. 3. Advanced bilateral facet degeneration at L3-4 with associated reactive marrow edema, left greater than right. 4. Multilevel degenerative  spondylolysis as above, most notable at L4-5 and L5-S1. Resultant moderate canal stenosis at L4-5, with moderate to severe left lateral recess narrowing at L5-S1. Critical Value/emergent results were called by telephone at the time of interpretation on 09/02/2017 at 8:30 pm to the ER PA [place] , who verbally acknowledged these results. Electronically Signed   By: Jeannine Boga M.D.   On: 09/02/2017 20:31   US Abdomen Complete  Result Date: 08/10/2017 CLINICAL DATA:  Elevated bilirubin level. EXAM: ABDOMEN ULTRASOUND COMPLETE COMPARISON:  CT scan of Aug 09, 2017. FINDINGS: Gallbladder: Multiple gallstones are noted with the largest measuring 1.1 cm. No gallbladder wall thickening or pericholecystic fluid is noted. No sonographic Murphy's sign is noted. Common bile duct: Diameter: 2.8 mm which is within normal limits. Liver: No focal lesion identified. Within normal limits in parenchymal echogenicity. Portal vein is patent on color Doppler imaging with normal direction of blood flow towards the liver. IVC: No abnormality visualized. Pancreas: Not well visualized due to overlying bowel gas. Spleen: Size and appearance within normal limits. Right Kidney: Length: 11 cm. Echogenicity within normal limits. No mass or hydronephrosis visualized. Left Kidney:  Length: 10.2 cm. Echogenicity within normal limits. No mass or hydronephrosis visualized. Abdominal aorta: No aneurysm visualized. Other findings: None. IMPRESSION: Cholelithiasis without inflammation. Pancreas not well visualized due to overlying bowel gas. No other abnormality seen in the abdomen. Electronically Signed   By: Marijo Conception, M.D.   On: 08/10/2017 14:32   Nm Hepato W/eject Fract  Result Date: 08/11/2017 CLINICAL DATA:  RIGHT upper quadrant pain for few days EXAM: NUCLEAR MEDICINE HEPATOBILIARY IMAGING WITH GALLBLADDER EF TECHNIQUE: Sequential images of the abdomen were obtained out to 60 minutes following intravenous  administration of radiopharmaceutical. After slow intravenous infusion of 1.91 micrograms Cholecystokinin, gallbladder ejection fraction was determined. RADIOPHARMACEUTICALS:  5.5 mCi Tc-53m Choletec IV COMPARISON:  Ultrasound abdomen 08/10/2017 FINDINGS: Normal tracer extraction from bloodstream indicating normal hepatocellular function. Normal excretion of tracer into biliary tree. Gallbladder visualized at 25 min, though no additional tracer is seen within the gallbladder until 58 minutes. Minimal tracer within gallbladder prior to CCK administration. Small bowel visualized at 12 min. No hepatic retention of tracer. Subjectively very little to no emptying of tracer from gallbladder following CCK administration. Calculated gallbladder ejection fraction is likely inaccurate due to superimposed small bowel loops with the gallbladder near the conclusion of the post CCK images. Patient reported no symptoms following CCK administration. Normal gallbladder ejection fraction following CCK stimulation is greater than 40% at 1 hour. IMPRESSION: Patent biliary tree. Suboptimal filling of gallbladder prior to CCK with limited assessment of gallbladder emptying post CCK due to superimposed bowel and poor filling, unable to generate an accurate gallbladder ejection fraction but subjectively there is very poor/little emptying following CCK stimulation. Electronically Signed   By: Lavonia Dana M.D.   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 Improved based on last PET/CT  Patient has had treatment as noted above. h/o cord compression at T3 level due to recurrent tumor status post partial resection and debulking.   MRI on 08/14/2015 showed improved but persistent cord compression at the T3 level. She has been noted not to be a candidate for further repeat surgery at this time or  repeat radiation. Recent PET/CT scan is noted of bowel done at [facility] on 08/07/2015 showed evidence of tumor progression and patient was started on lenvatinib as per Dr. Leamon Arnt. Her thyroglobulin level was 11.7 with anti-body of 9.8 on 08/07/2015 at [place]  PET/CT 11/01/2015: shows relatively stable though high burden of disease. Some concern of local recurrence in the thyroidectomy bed which is asymptomatic at this time. MRI thoracic spine 12/25/2015: No new abnormality since the prior examination. Status post T1-5 fusion for T3 decompression. Mass effect on the cord at the T3 level appears improved compared to the prior exam. Postoperative fluid collection likely representing a seroma seen on the prior study has slightly decreased in size.  PET/CT 06/03/2016 with Generally reduced hypermetabolic activity in the previously identified lesions, including the pulmonary nodules, dominant upper thoracic metastatic focus, and left lower cervical adenopathy. Mildly reduced size of previous lesions as well. No new lesions.  PET/CT 12/12/2016: Concern for progression of metastatic Hurthle cell thyroid carcinoma with significant increase in metabolic activity of several lymph nodes, pulmonary nodules, and skeletal metastasis. Most convincing element of progression is a significant increase in metabolic activity (more than doubling). Mild increase in size of the lymph nodes.  PET/CT  06/15/38: Partial metabolic response. Left  supraclavicular and right paratracheal nodal metastases, bilateral pulmonary metastases, and osseous metastases involving the upper thoracic spine and right sternum, with mild decrease in metabolism, as described above.   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 Previous had gotten to a point of being able to standup support of a walker and transfer. Plan: -MRI results discussed -on high dose steroids -evaluated by Dr Christella Noa and scheduled for surgery -discussed  with radiation oncology- not a good candidate for rpt RT given previous h/o RT. 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 Monitor for bleeding. No bleeding reported -Xarelto on hold for surgery -will need peri-operative prophylaxis with lovenox and transition back to Xarelto post-op once hemostasis confirmed.  . The total time spent in the appointment was 35 minutes and more than 50% was on counseling and direct patient cares.     Sullivan Lone MD MS AAHIVMS [facility] Hematology/Oncology Physician [facility]  (Office):       [phone] (Work cell):  [phone] (Fax):           [phone]  09/03/2017 3:29 PM  I, Baldwin [place], am acting as a Education administrator for Dr Irene Limbo.   .I have reviewed the above documentation for accuracy and completeness, and I agree with the above. Sullivan Lone MD MS

## 2017-09-03 NOTE — Progress Notes (Addendum)
Progress Note    Sarah [name]  TDV:[id number] DOB: June 29, 1965  DOA: 09/02/2017 PCP: [name], MD    Brief Narrative:   Chief complaint: F/U LE weakness  Medical records reviewed and are as summarized below:  Sarah [name] is an 52 y.o. 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  On the date of admission, she fell when attempting to transfer from her bed to a wheelchair.  In the ED, the patient had an MRI of her thoracic and lumbar spine which showed interval worsening of a metastatic lesion at C3 with worsening mass-effect on the thecal sac and cord compression.  Neurosurgical consultation performed by Dr. Ronnald Ramp who recommended inpatient management.  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  Interval worsening of metastatic lesion at C3 with mass-effect on the thecal sac and cord compression.  Admitted and placed on Decadron.  Consulted radiation oncology and medical oncology. Neurosurgery consulted by admitting MD last night.  Active Problems:   Hurthle cell carcinoma of thyroid ([place]) Follows with [name], last visit 08/24/2017.  History of spinal cord compression in 2017 which required emergent laminectomy and partial tumor resection. Lenvima currently on hold.  Notified [name] of the patient's hospital admission but unfortunately, she is running out of treatment options.    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  Currently on Fondaparinux.    Hypertension Blood pressure improved with resumption of lisinopril and  nifedipine.     UTI No reports of dysuria, but noted to have new urinary incontinence which is more likely due to cord compression than UTI.  Nonetheless, UA was abnormal and she was started on Macrobid in the ED, followed by ceftriaxone on admission. F/U urine culture.    Obesity Body mass index is 30.44 kg/m.   Family Communication/Anticipated D/C date and plan/Code Status   DVT prophylaxis: Fondaparinux ordered. Code Status: Full Code.  Family Communication: Mother and sister at the bedside. Disposition Plan: Pending PT evaluation.    Medical Consultants:    Oncology  Radiation Oncology  Neurosurgery   Anti-Infectives:    Rocephin 09/02/17--->  Subjective:   Reports inability to move her lower legs, feet. No significant pain. No nausea/vomiting.  Objective:    Vitals:   09/02/17 2300 09/03/17 0002 09/03/17 0130 09/03/17 0524  BP: (!) 166/120 (!) 143/90 123/80 99/63  Pulse: 98 (!) 102 (!) 101 92  Resp: (!) 25 20 18 16   Temp:  98.5 F (36.9 C) 98.4 F (36.9 C) 99 F (37.2 C)  TempSrc:  Oral Oral Oral  SpO2: 99% 98% 97% 96%  Weight: 99 kg (218 lb 4.1 oz)     Height: 5\' 11"  (1.803 m)       Intake/Output Summary (Last 24 hours) at 09/03/2017 0732 Last data filed at 09/03/2017 0700 Gross per 24 hour  Intake 116.67 ml  Output 250 ml  Net -133.33 ml   Filed Weights   09/02/17 2300  Weight: 99 kg (218 lb 4.1 oz)    Exam: General: No acute distress. Cardiovascular: Heart sounds show  a regular rate, and rhythm. No gallops or rubs. No murmurs. No JVD. Lungs: Clear to auscultation bilaterally with good air movement. No rales, rhonchi or wheezes. Abdomen: Soft, nontender, nondistended with normal active bowel sounds. No masses. No hepatosplenomegaly. Neurological: Alert and oriented 3. Flaccid paralysis lower extremities. Cranial nerves II through XII grossly intact. Skin: Warm and dry. Multiple bruises, abrasions to lower legs. Extremities: No clubbing or  cyanosis. 2+ edema. Pedal pulses 2+. Psychiatric: Mood and affect are normal. Insight and judgment are good.   Data Reviewed:   I have personally reviewed following labs and imaging studies:  Labs: Labs show the following:   Basic Metabolic Panel: Recent Labs  Lab 09/02/17 1506  NA 141  K 4.0  CL 105  CO2 27  GLUCOSE 133*  BUN 17  CREATININE 0.98  CALCIUM 8.4*   GFR Estimated Creatinine Clearance: 88 mL/min (by C-G formula based on SCr of 0.98 mg/dL).  CBC: Recent Labs  Lab 09/02/17 1506  WBC 5.8  NEUTROABS 4.6  HGB 10.9*  HCT 34.0*  MCV 94.7  PLT 190    Microbiology No results found for this or any previous visit (from the past 240 hour(s)).  Procedures and diagnostic studies:  Dg Shoulder Right  Result Date: 09/02/2017 CLINICAL DATA:  Pain following fall EXAM: RIGHT SHOULDER - 2+ VIEW COMPARISON:  None. FINDINGS: Frontal and Y scapular images were obtained. There is no appreciable fracture or dislocation. There is moderate generalized osteoarthritic change. No erosive change or intra-articular calcification. Visualized right lung is clear. Postoperative changes present in the upper thoracic region. 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 No fracture or dislocation. Electronically Signed   By: Lowella Grip III M.D.   On: 09/02/2017 14:04   Ct Head Wo Contrast  Result Date: 09/02/2017 CLINICAL DATA:  Status post fall moving from a bed to a wheelchair today. Initial encounter. EXAM: CT HEAD WITHOUT CONTRAST CT CERVICAL SPINE WITHOUT CONTRAST TECHNIQUE: Multidetector CT imaging of the head and cervical spine was performed following the standard protocol without intravenous contrast. Multiplanar CT image reconstructions of the cervical spine were also generated. COMPARISON:  Head CT scan 12/22/2016.  PET CT scan 05/20/2017. FINDINGS: CT HEAD FINDINGS Brain: No evidence of acute infarction, hemorrhage, hydrocephalus, extra-axial  collection or mass lesion/mass effect. Vascular: A few atherosclerotic calcifications are seen. Skull: Intact.  No focal lesion. Sinuses/Orbits: Mild mucosal thickening is noted in the maxillary sinuses, frontal sinuses, sphenoid sinus and scattered ethmoid air cells. Other: None. CT CERVICAL SPINE FINDINGS Alignment: Maintained. Skull base and vertebrae: No acute fracture. No primary bone lesion or focal pathologic process. Soft tissues and spinal canal: No prevertebral fluid or swelling. No visible canal hematoma. Disc levels: Intervertebral disc space height is maintained. There is some endplate spurring most notable at C3-4 and C5-6. Partial visualization of thoracic fusion hardware noted. Upper chest: 0.5 cm left upper lobe pulmonary nodule is seen on prior PET CT scan is noted. Other: None. IMPRESSION: No acute abnormality head or cervical spine. Atherosclerosis. Sinus disease. Mild appearing cervical spondylosis. No change in 0.5 cm left upper lobe pulmonary nodule consistent with the patient's known metastatic thyroid carcinoma. Electronically Signed   By: Inge Rise M.D.   On: 09/02/2017 14:37   Ct Cervical Spine Wo Contrast  Result Date: 09/02/2017 CLINICAL DATA:  Status post fall moving from a bed to a wheelchair today. Initial encounter. EXAM: CT HEAD WITHOUT CONTRAST CT CERVICAL SPINE WITHOUT CONTRAST TECHNIQUE: Multidetector CT imaging  of the head and cervical spine was performed following the standard protocol without intravenous contrast. Multiplanar CT image reconstructions of the cervical spine were also generated. COMPARISON:  Head CT scan 12/22/2016.  PET CT scan 05/20/2017. FINDINGS: CT HEAD FINDINGS Brain: No evidence of acute infarction, hemorrhage, hydrocephalus, extra-axial collection or mass lesion/mass effect. Vascular: A few atherosclerotic calcifications are seen. Skull: Intact.  No focal lesion. Sinuses/Orbits: Mild mucosal thickening is noted in the maxillary sinuses, frontal  sinuses, sphenoid sinus and scattered ethmoid air cells. Other: None. CT CERVICAL SPINE FINDINGS Alignment: Maintained. Skull base and vertebrae: No acute fracture. No primary bone lesion or focal pathologic process. Soft tissues and spinal canal: No prevertebral fluid or swelling. No visible canal hematoma. Disc levels: Intervertebral disc space height is maintained. There is some endplate spurring most notable at C3-4 and C5-6. Partial visualization of thoracic fusion hardware noted. Upper chest: 0.5 cm left upper lobe pulmonary nodule is seen on prior PET CT scan is noted. Other: None. IMPRESSION: No acute abnormality head or cervical spine. Atherosclerosis. Sinus disease. Mild appearing cervical spondylosis. No change in 0.5 cm left upper lobe pulmonary nodule consistent with the patient's known metastatic thyroid carcinoma. Electronically Signed   By: Inge Rise M.D.   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 T3 metastasis with history of prior decompression. EXAM: MRI THORACIC AND LUMBAR SPINE WITHOUT AND WITH CONTRAST TECHNIQUE: Multiplanar and multiecho pulse sequences of the thoracic and lumbar spine were obtained without and with intravenous contrast. CONTRAST:  36mL MULTIHANCE GADOBENATE DIMEGLUMINE 529 MG/ML IV SOLN COMPARISON:  Previous MRI from 12/25/2015. FINDINGS: MRI THORACIC SPINE FINDINGS Alignment: Examination mildly limited as the patient was unable to tolerate the full length of the exam. Postcontrast axial sequences as well as post-contrast imaging through the lumbar spine was not performed. Stable alignment with preservation of the normal thoracic kyphosis. No listhesis. Vertebrae: Postoperative changes from prior fusion from T1 through T5 for T3 decompression again seen. Pathologic fracture deformity of  T3 is relatively unchanged in appearance. Interval growth with progressive tumor seen within the adjacent spinal canal, extending from the mid aspect of T2 through the mid aspect of T4 (series 7, image 8). There has been marked interval worsening of cord and thecal sac progression, with the sac compressed laterally and slit like in appearance, measuring approximately 2 mm in transverse diameter at its most narrow point (series 10, image 12). Suspected cord signal abnormality at this level, although poorly visualized due to susceptibility artifact from adjacent hardware. Postoperative fluid collection again noted at the adjacent laminectomy site, likely slightly decreased in size. No other metastatic lesions identified. Reactive marrow edema and enhancement about the right T12-L1 facet favored to be degenerative in nature (series 6, image 4). Vertebral body heights otherwise maintained. Mild discogenic reactive endplate changes present at the thoracolumbar junction. Cord:  No other cord signal abnormality identified. Paraspinal and other soft tissues: No other acute paraspinous soft tissue abnormality. Probable mild cholelithiasis noted. Visualized visceral structures otherwise unremarkable. Disc levels: T7-8: Central disc extrusion flattens the ventral thoracic spinal cord and resultant moderate spinal stenosis, stable. Left paracentral disc protrusion at T8-9 with mild flattening of the left hemi cord also unchanged. MRI LUMBAR SPINE FINDINGS Segmentation: Normal segmentation. Lowest well-formed disc labeled the L5-S1 level. Alignment: Levoscoliosis. Vertebral bodies otherwise normally aligned with preservation  of the normal lumbar lordosis. No listhesis. Vertebrae: Mild height loss with reactive endplate edema at the left aspect of the superior endplate of L4, suspicious for mild compression fracture (series 14, image 12). No bony retropulsion. This is benign in appearance, with no underlying lesion identified. No  worrisome osseous lesions or evidence for metastatic disease identified within the lumbar spine. Prominent reactive marrow edema about the left L3-4 facet due to advanced facet degeneration. Conus medullaris: Extends to the L1 level and appears normal. Paraspinal and other soft tissues: Paraspinous soft tissues within normal limits. Visualized visceral structures unremarkable. Disc levels: L1-2:  Mild facet and ligament flavum hypertrophy.  No stenosis. L2-3: Minimal disc bulge with bilateral facet hypertrophy. No significant stenosis. L3-4: Disc desiccation with minimal disc bulge. Prominent anterior bridging osteophyte. Advanced bilateral facet degeneration with associated reactive marrow edema, left greater than right. 7 mm synovial cyst noted at the posterior aspect of the right L3-4 facet. Mild canal with bilateral L3 foraminal stenosis. L4-5: Chronic intervertebral disc mild diffuse disc bulge. Moderate to advanced facet arthrosis, left greater than right. Resultant moderate canal with bilateral subarticular stenosis. Foramina remain patent. L5-S1: Chronic intervertebral disc space narrowing with diffuse disc bulge. Left greater than right facet degeneration. Resultant moderate to severe left lateral recess narrowing. Mild right with mild to moderate left L5 foraminal stenosis. IMPRESSION: MRI THORACIC SPINE IMPRESSION 1. 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 Neuro surgical consultation recommended. 2. Superimposed sequelae of prior T1 through T5 fusion for T3 decompression. 3. Stable disc protrusions at T7-8 and T8-9. 4. Prominent facet arthropathy at T12-L1 on the right with associated reactive marrow edema. MRI LUMBAR SPINE IMPRESSION 1. No evidence for metastatic disease within the lumbar spine. 2. Mild acute to subacute endplate compression deformity involving the left aspect of the superior endplate of L4.  This is benign in appearance with no underlying pathologic lesion. 3. Advanced bilateral facet degeneration at L3-4 with associated reactive marrow edema, left greater than right. 4. Multilevel degenerative spondylolysis as above, most notable at L4-5 and L5-S1. Resultant moderate canal stenosis at L4-5, with moderate to severe left lateral recess narrowing at L5-S1. Critical Value/emergent results were called by telephone at the time of interpretation on 09/02/2017 at 8:30 pm to the ER PA [place] , who verbally acknowledged these results. Electronically Signed   By: Jeannine Boga M.D.   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 EXAM: MRI THORACIC AND LUMBAR SPINE WITHOUT AND WITH CONTRAST TECHNIQUE: Multiplanar and multiecho pulse sequences of the thoracic and lumbar spine were obtained without and with intravenous contrast. CONTRAST:  29mL MULTIHANCE GADOBENATE DIMEGLUMINE 529 MG/ML IV SOLN COMPARISON:  Previous MRI from 12/25/2015. FINDINGS: MRI THORACIC SPINE FINDINGS Alignment: Examination mildly limited as the patient was unable to tolerate the full length of the exam. Postcontrast axial sequences as well as post-contrast imaging through the lumbar spine was not performed. Stable alignment with preservation of the normal thoracic kyphosis. No listhesis. Vertebrae: Postoperative changes from prior fusion from T1 through T5 for T3 decompression again seen. Pathologic fracture deformity of T3 is relatively unchanged in appearance. Interval growth with progressive tumor seen within the adjacent spinal canal, extending from the mid aspect  of T2 through the mid aspect of T4 (series 7, image 8). There has been marked interval worsening of cord and thecal sac  progression, with the sac compressed laterally and slit like in appearance, measuring approximately 2 mm in transverse diameter at its most narrow point (series 10, image 12). Suspected cord signal abnormality at this level, although poorly visualized due to susceptibility artifact from adjacent hardware. Postoperative fluid collection again noted at the adjacent laminectomy site, likely slightly decreased in size. No other metastatic lesions identified. Reactive marrow edema and enhancement about the right T12-L1 facet favored to be degenerative in nature (series 6, image 4). Vertebral body heights otherwise maintained. Mild discogenic reactive endplate changes present at the thoracolumbar junction. Cord:  No other cord signal abnormality identified. Paraspinal and other soft tissues: No other acute paraspinous soft tissue abnormality. Probable mild cholelithiasis noted. Visualized visceral structures otherwise unremarkable. Disc levels: T7-8: Central disc extrusion flattens the ventral thoracic spinal cord and resultant moderate spinal stenosis, stable. Left paracentral disc protrusion at T8-9 with mild flattening of the left hemi cord also unchanged. MRI LUMBAR SPINE FINDINGS Segmentation: Normal segmentation. Lowest well-formed disc labeled the L5-S1 level. Alignment: Levoscoliosis. Vertebral bodies otherwise normally aligned with preservation of the normal lumbar lordosis. No listhesis. Vertebrae: Mild height loss with reactive endplate edema at the left aspect of the superior endplate of L4, suspicious for mild compression fracture (series 14, image 12). No bony retropulsion. This is benign in appearance, with no underlying lesion identified. No worrisome osseous lesions or evidence for metastatic disease identified within the lumbar spine. Prominent reactive marrow edema about the left L3-4 facet due to advanced facet degeneration. Conus medullaris: Extends to the L1 level and appears normal. Paraspinal and  other soft tissues: Paraspinous soft tissues within normal limits. Visualized visceral structures unremarkable. Disc levels: L1-2:  Mild facet and ligament flavum hypertrophy.  No stenosis. L2-3: Minimal disc bulge with bilateral facet hypertrophy. No significant stenosis. L3-4: Disc desiccation with minimal disc bulge. Prominent anterior bridging osteophyte. Advanced bilateral facet degeneration with associated reactive marrow edema, left greater than right. 7 mm synovial cyst noted at the posterior aspect of the right L3-4 facet. Mild canal with bilateral L3 foraminal stenosis. L4-5: Chronic intervertebral disc mild diffuse disc bulge. Moderate to advanced facet arthrosis, left greater than right. Resultant moderate canal with bilateral subarticular stenosis. Foramina remain patent. L5-S1: Chronic intervertebral disc space narrowing with diffuse disc bulge. Left greater than right facet degeneration. Resultant moderate to severe left lateral recess narrowing. Mild right with mild to moderate left L5 foraminal stenosis. IMPRESSION: MRI THORACIC SPINE IMPRESSION 1. 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 Neuro surgical consultation recommended. 2. Superimposed sequelae of prior T1 through T5 fusion for T3 decompression. 3. Stable disc protrusions at T7-8 and T8-9. 4. Prominent facet arthropathy at T12-L1 on the right with associated reactive marrow edema. MRI LUMBAR SPINE IMPRESSION 1. No evidence for metastatic disease within the lumbar spine. 2. Mild acute to subacute endplate compression deformity involving the left aspect of the superior endplate of L4. This is benign in appearance with no underlying pathologic lesion. 3. Advanced bilateral facet degeneration at L3-4 with associated reactive marrow edema, left greater than right. 4. Multilevel degenerative spondylolysis as above, most notable at L4-5 and L5-S1.  Resultant moderate canal stenosis at L4-5, with moderate to severe left lateral recess narrowing at L5-S1. Critical Value/emergent results were called by telephone at  the time of interpretation on 09/02/2017 at 8:30 pm to the ER PA [address] , who verbally acknowledged these results. Electronically Signed   By: Jeannine Boga M.D.   On: 09/02/2017 20:31    Medications:   . baclofen  20 mg Oral TID  . dexamethasone  4 mg Intravenous Q6H  . fluticasone  2 spray Each Nare Daily  . fondaparinux (ARIXTRA) injection  7.5 mg Subcutaneous Q0600  . levothyroxine  137 mcg Oral QAC breakfast  . lisinopril  40 mg Oral Daily  . NIFEdipine  30 mg Oral Daily  . pantoprazole  40 mg Oral Daily  . senna  2 tablet Oral Daily  . sertraline  25 mg Oral Daily   Continuous Infusions: . cefTRIAXone (ROCEPHIN)  IV Stopped (09/03/17 0150)     LOS: 1 day   Jacquelynn Cree  [facility] Pager [phone]. If unable to reach me by pager, please call my cell phone at [phone].  *Please refer to [email or web address], password [email or web address] to get updated schedule on who will round on this patient, as hospitalists switch teams weekly. If 7PM-7AM, please contact night-coverage at [email or web address], password [email or web address] for any overnight needs.  09/03/2017, 7:32 AM

## 2017-09-03 NOTE — Consult Note (Addendum)
Radiation Oncology         [phone]) [phone] ________________________________  Name: Sarah [name]        MRN: [id number]  Date of Service: 09/03/17 DOB: 1965-08-02  ZY:[id number], Earna Coder, MD  No ref. provider found     REFERRING PHYSICIAN: No ref. 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 Diagnoses of Fall, initial encounter, Acute pain of right shoulder, Neck pain, Leg weakness, bilateral, Chronic anemia, Essential hypertension, Lower urinary tract infectious disease, and Metastasis to spinal column [facility]) were also pertinent to this visit.   HISTORY OF PRESENT ILLNESS: Sarah [name] is a 52 y.o. female seen at the request of Dr. Ronnald Ramp for a history of metastatic thyroid cancer. She was originally diagnosed in 2015 when she had a mass in the T3 vertebral body causing cord compression and a 7.2 x 5.2 x 11.2 cm mass in the left thyroid. She was decompressed surgically with Dr. Christella Noa on 12/14/13 and final pathology revealed carcinoma most consistent with thyroid primary. 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 She went on to receive 30 Gy to C7-T6 with Dr. [name] which she completed on 01/31/14. The patient made a tremendous recover and regained her ability to walk about 6 months after surgery. Her course was prolonged but she has had an excellent quality of life as a result. Unfortunately she was found to have recurrent disease at T3 vertebral body in the spring of 2017 when she presented with cord compression again. There was discussion of re-irradiation versus surgical decompression which she  underwent with Dr. Christella Noa on 07/05/15. She again had a remarkable recovery and regained use of her legs, and her continence. She had been able to walk with a walker, and stand up completely unassisted until about 2 1/2 weeks ago when she started noticing increasing  lower extremity weakness. She had an appointment last Monday with [name] and she was offered admission to work up her symptoms versus outpatient MRI. She elected for outpatient MRI, but yesterday had an episode of progressive weakness and fell. She was brought by EMS to the hospital overnight and CT of the head and C spine were negative for acute changes. Her MRI of the thoracic and lumbar spine yesterday evening reveal concerns for interval worsening of the T3 lesion with worsened mass effect on the thecal sac and cord compression. there was stable changes from prior instrumentation, and in the lumbar spine no metastatic changes. She was seen by Dr. Ronnald Ramp and he did not feel that she would benefit overnight from decompression, but wanted to discuss her case with Dr. Christella Noa and requested radiation oncology input to consider re-irradiation.  PREVIOUS RADIATION THERAPY: Yes   01/18/14-01/31/14: 30 Gy in 10 fractions from C7-T6  Radioactive Iodine in 2015 though she did not respond to this therapy.  PAST MEDICAL HISTORY:  Past Medical History:  Diagnosis Date  . Cancer [facility])    FNA of thyroid positive for onconytic/hurthle cell carcinoma  . Chronic back pain   . DDD (degenerative disc disease), cervical   . DJD (degenerative joint disease)   . History of rectal fissure   . HIT (heparin-induced thrombocytopenia) ([place])   . Hypertension   . Obesity   . Paraplegia at T4 level ([place])   . Pulmonary embolus, right ([place]) 2015  . Rotator cuff tendonitis right       PAST SURGICAL HISTORY:  Past Surgical History:  Procedure Laterality Date  . LAMINECTOMY N/A 12/14/2013   Procedure: THORACIC LAMINECTOMY FOR TUMOR THORACIC THREE;  Surgeon: Ashok Pall, MD;  Location: [place] NEURO ORS;  Service: Neurosurgery;  Laterality: N/A;  THORACIC LAMINECTOMY FOR TUMOR THORACIC THREE  . LAMINECTOMY N/A 07/05/2015   Procedure: THORACIC LAMINECTOMY FOR TUMOR;  Surgeon: Ashok Pall, MD;  Location: [place] NEURO ORS;   Service: Neurosurgery;  Laterality: N/A;  . POSTERIOR LUMBAR FUSION 4 LEVEL N/A 12/30/2013   Procedure: Thoracic one-Thoracic five posterior thoracic fusion with pedicle screws;  Surgeon: Ashok Pall, MD;  Location: [place] NEURO ORS;  Service: Neurosurgery;  Laterality: N/A;  Thoracic one-Thoracic five posterior thoracic fusion with pedicle screws  . THYROIDECTOMY N/A 01/09/2014   Procedure: TOTAL THYROIDECTOMY;  Surgeon: Izora Gala, MD;  Location: [place];  Service: ENT;  Laterality: N/A;  . TONSILLECTOMY       FAMILY HISTORY:  Family History  Problem Relation Age of Onset  . Hypertension Mother   . Diabetes Mother   . Hypertension Father   . Stroke Father   . Diabetes Father   . Diabetes Sister   . Diabetes Sister      SOCIAL HISTORY:  reports that she has never smoked. She has never used smokeless tobacco. She reports that she does not drink alcohol or use drugs. The patient is single and lives in [place]. She is accompanied by her mother and her sister.   ALLERGIES: Bee venom; Keflex [cephalexin]; Tramadol; Gabapentin; Heparin; Hydrochlorothiazide; Hydrocodone; and Oxycodone-acetaminophen   MEDICATIONS:  Current Facility-Administered Medications  Medication Dose Route Frequency Provider Last Rate Last Dose  . baclofen (LIORESAL) tablet 20 mg  20 mg Oral TID Emokpae, Ejiroghene E, MD   20 mg at 09/03/17 0101  . cefTRIAXone (ROCEPHIN) 2 g in sodium chloride 0.9 % 100 mL IVPB  2 g Intravenous QHS Emokpae, Ejiroghene E, MD   Stopped at 09/03/17 0150  . dexamethasone (DECADRON) injection 4 mg  4 mg Intravenous Q6H Emokpae, Ejiroghene E, MD   4 mg at 09/03/17 0517  . fluticasone (FLONASE) 50 MCG/ACT nasal spray 2 spray  2 spray Each Nare Daily Emokpae, Ejiroghene E, MD      . fondaparinux (ARIXTRA) injection 7.5 mg  7.5 mg Subcutaneous Q0600 Emokpae, Ejiroghene E, MD   7.5 mg at 09/03/17 0110  . HYDROcodone-acetaminophen (NORCO/VICODIN) 5-325 MG per tablet 1-2 tablet  1-2 tablet Oral Q4H  PRN Emokpae, Ejiroghene E, MD   1 tablet at 09/03/17 0404  . levothyroxine (SYNTHROID, LEVOTHROID) tablet 137 mcg  137 mcg Oral QAC breakfast Emokpae, Ejiroghene E, MD      . lisinopril (PRINIVIL,ZESTRIL) tablet 40 mg  40 mg Oral Daily Emokpae, Ejiroghene E, MD   40 mg at 09/03/17 0055  . NIFEdipine (PROCARDIA-XL/ADALAT-CC/NIFEDICAL-XL) 24 hr tablet 30 mg  30 mg Oral Daily Emokpae, Ejiroghene E, MD      . ondansetron (ZOFRAN) tablet 4 mg  4 mg Oral Q6H PRN Emokpae, Ejiroghene E, MD       Or  . ondansetron (ZOFRAN) injection 4 mg  4 mg Intravenous Q6H PRN Emokpae, Ejiroghene E, MD      . pantoprazole (PROTONIX) EC tablet 40 mg  40 mg Oral Daily Emokpae, Ejiroghene E, MD      . senna (SENOKOT) tablet 17.2 mg  2 tablet Oral Daily Emokpae, Ejiroghene E, MD      . sertraline (ZOLOFT) tablet 25 mg  25 mg Oral Daily Emokpae, Ejiroghene E, MD      .  traZODone (DESYREL) tablet 50 mg  50 mg Oral QHS PRN Emokpae, Ejiroghene E, MD   50 mg at 09/03/17 0101     REVIEW OF SYSTEMS: On review of systems, the patient reports that she is doing okay but has been concerned that she has not been able to move her lower extremities much at all. She is on Dexamethasone 4 mg q 6 hours. She denies any chest pain, shortness of breath, cough, fevers, chills, night sweats, unintended weight changes. She denies any control of her of  bowel or bladder. In retrospect she has had some sharp shooting pain in her upper chest wall and right shoulder. She denies abdominal pain, nausea or vomiting. She denies any new musculoskeletal or joint aches or pains. A complete review of systems is obtained and is otherwise negative.     PHYSICAL EXAM:  Wt Readings from Last 3 Encounters:  09/02/17 218 lb 4.1 oz (99 kg)  08/09/17 211 lb 10.3 oz (96 kg)  05/28/17 208 lb (94.3 kg)   Temp Readings from Last 3 Encounters:  09/03/17 99 F (37.2 C) (Oral)  08/24/17 98.6 F (37 C) (Oral)  08/21/17 98 F (36.7 C) (Oral)   BP Readings from  Last 3 Encounters:  09/03/17 99/63  08/24/17 118/90  08/21/17 98/78   Pulse Readings from Last 3 Encounters:  09/03/17 92  08/24/17 93  08/21/17 89   Pain Assessment Pain Score: 2 /10  In general this is a well appearing caucasian female in no acute distress. She is alert and oriented x4 and appropriate throughout the examination. HEENT reveals that the patient is normocephalic, atraumatic. EOMs are intact. PERRLA. Skin is intact without any evidence of gross lesions of her upper extremities. She has several eschars of her lower extremities bilaterally. Cardiopulmonary assessment is negative for acute distress and she exhibits normal effort. Lower extremities are negative for deep calf tenderness, cyanosis or clubbing, but she is unable to detect sensation to light touch over bilateral lower extremities, and is unable to lift her legs.    ECOG = 4  0 - Asymptomatic (Fully active, able to carry on all predisease activities without restriction)  1 - Symptomatic but completely ambulatory (Restricted in physically strenuous activity but ambulatory and able to carry out work of a light or sedentary nature. For example, light housework, office work)  2 - Symptomatic, <50% in bed during the day (Ambulatory and capable of all self care but unable to carry out any work activities. Up and about more than 50% of waking hours)  3 - Symptomatic, >50% in bed, but not bedbound (Capable of only limited self-care, confined to bed or chair 50% or more of waking hours)  4 - Bedbound (Completely disabled. Cannot carry on any self-care. Totally confined to bed or chair)  5 - Death   Eustace Pen MM, Creech RH, Tormey DC, et al. [phone]). "Toxicity and response criteria of the [facility]". [place] Oncol. 5 (6): 649-55    LABORATORY DATA:  Lab Results  Component Value Date   WBC 5.8 09/02/2017   HGB 10.9 (L) 09/02/2017   HCT 34.0 (L) 09/02/2017   MCV 94.7 09/02/2017   PLT 190  09/02/2017   Lab Results  Component Value Date   NA 141 09/02/2017   K 4.0 09/02/2017   CL 105 09/02/2017   CO2 27 09/02/2017   Lab Results  Component Value Date   ALT 64 (H) 08/24/2017   AST 39 (H) 08/24/2017  ALKPHOS 328 (H) 08/24/2017   BILITOT 0.6 08/24/2017      RADIOGRAPHY: Ct Abdomen Pelvis Wo Contrast  Result Date: 08/09/2017 CLINICAL DATA:  Per ED notes: Pt presents today for nausea x 2 days. Took zofran at home with no relief. Pt denies diarrhea. Pt take oral chemo for thyroid CA with mets. Pt denies being around anyone with similar symptoms. EXAM: CT ABDOMEN AND PELVIS WITHOUT CONTRAST TECHNIQUE: Multidetector CT imaging of the abdomen and pelvis was performed following the standard protocol without IV contrast. COMPARISON:  PET-CT 05/20/2017 and previous FINDINGS: Lower chest: Lower lobe pulmonary nodules up to 5 mm, stable since previous. No pleural or pericardial effusion. Hepatobiliary: Gallbladder is distended with small partially calcified stones in its dependent aspect. Unremarkable liver. Pancreas: Unremarkable. No pancreatic ductal dilatation or surrounding inflammatory changes. Spleen: Normal in size without focal abnormality. Adrenals/Urinary Tract: Adrenal glands are unremarkable. Kidneys are normal, without renal calculi, focal lesion, or hydronephrosis. Bladder is unremarkable. Stomach/Bowel: Stomach is nondistended. Small bowel is nondilated. Colon is nondilated with a few scattered diverticula. Vascular/Lymphatic: No significant vascular findings are present. No enlarged abdominal or pelvic lymph nodes. Reproductive: Uterus and bilateral adnexa are unremarkable. Other: No ascites.  No free air. Musculoskeletal: Spondylitic changes throughout the lumbar spine. Bilateral hip degenerative spurring. No fracture or worrisome bone lesion. IMPRESSION: 1. No acute findings. 2. Stable small pulmonary nodules at the visualized lung bases. Electronically Signed   By: Lucrezia Europe  M.D.   On: 08/09/2017 13:54   Dg Shoulder Right  Result Date: 09/02/2017 CLINICAL DATA:  Pain following fall EXAM: RIGHT SHOULDER - 2+ VIEW COMPARISON:  None. FINDINGS: Frontal and Y scapular images were obtained. There is no appreciable fracture or dislocation. There is moderate generalized osteoarthritic change. No erosive change or intra-articular calcification. Visualized right lung is clear. Postoperative changes present in the upper thoracic region. 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 No fracture or dislocation. Electronically Signed   By: Lowella Grip III M.D.   On: 09/02/2017 14:04   Ct Head Wo Contrast  Result Date: 09/02/2017 CLINICAL DATA:  Status post fall moving from a bed to a wheelchair today. Initial encounter. EXAM: CT HEAD WITHOUT CONTRAST CT CERVICAL SPINE WITHOUT CONTRAST TECHNIQUE: Multidetector CT imaging of the head and cervical spine was performed following the standard protocol without intravenous contrast. Multiplanar CT image reconstructions of the cervical spine were also generated. COMPARISON:  Head CT scan 12/22/2016.  PET CT scan 05/20/2017. FINDINGS: CT HEAD FINDINGS Brain: No evidence of acute infarction, hemorrhage, hydrocephalus, extra-axial collection or mass lesion/mass effect. Vascular: A few atherosclerotic calcifications are seen. Skull: Intact.  No focal lesion. Sinuses/Orbits: Mild mucosal thickening is noted in the maxillary sinuses, frontal sinuses, sphenoid sinus and scattered ethmoid air cells. Other: None. CT CERVICAL SPINE FINDINGS Alignment: Maintained. Skull base and vertebrae: No acute fracture. No primary bone lesion or focal pathologic process. Soft tissues and spinal canal: No prevertebral fluid or swelling. No visible canal hematoma. Disc levels: Intervertebral disc space height is maintained. There is some endplate spurring most notable at C3-4 and C5-6. Partial visualization of thoracic fusion hardware noted.  Upper chest: 0.5 cm left upper lobe pulmonary nodule is seen on prior PET CT scan is noted. Other: None. IMPRESSION: No acute abnormality head or cervical spine. Atherosclerosis. Sinus disease. Mild appearing cervical spondylosis. No change in 0.5 cm left upper lobe pulmonary nodule consistent with the patient's known metastatic thyroid carcinoma. Electronically Signed   By: Marcello Moores  Dalessio M.D.   On: 09/02/2017 14:37   Ct Cervical Spine Wo Contrast  Result Date: 09/02/2017 CLINICAL DATA:  Status post fall moving from a bed to a wheelchair today. Initial encounter. EXAM: CT HEAD WITHOUT CONTRAST CT CERVICAL SPINE WITHOUT CONTRAST TECHNIQUE: Multidetector CT imaging of the head and cervical spine was performed following the standard protocol without intravenous contrast. Multiplanar CT image reconstructions of the cervical spine were also generated. COMPARISON:  Head CT scan 12/22/2016.  PET CT scan 05/20/2017. FINDINGS: CT HEAD FINDINGS Brain: No evidence of acute infarction, hemorrhage, hydrocephalus, extra-axial collection or mass lesion/mass effect. Vascular: A few atherosclerotic calcifications are seen. Skull: Intact.  No focal lesion. Sinuses/Orbits: Mild mucosal thickening is noted in the maxillary sinuses, frontal sinuses, sphenoid sinus and scattered ethmoid air cells. Other: None. CT CERVICAL SPINE FINDINGS Alignment: Maintained. Skull base and vertebrae: No acute fracture. No primary bone lesion or focal pathologic process. Soft tissues and spinal canal: No prevertebral fluid or swelling. No visible canal hematoma. Disc levels: Intervertebral disc space height is maintained. There is some endplate spurring most notable at C3-4 and C5-6. Partial visualization of thoracic fusion hardware noted. Upper chest: 0.5 cm left upper lobe pulmonary nodule is seen on prior PET CT scan is noted. Other: None. IMPRESSION: No acute abnormality head or cervical spine. Atherosclerosis. Sinus disease. Mild appearing  cervical spondylosis. No change in 0.5 cm left upper lobe pulmonary nodule consistent with the patient's known metastatic thyroid carcinoma. Electronically Signed   By: Inge Rise M.D.   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 T3 metastasis with history of prior decompression. EXAM: MRI THORACIC AND LUMBAR SPINE WITHOUT AND WITH CONTRAST TECHNIQUE: Multiplanar and multiecho pulse sequences of the thoracic and lumbar spine were obtained without and with intravenous contrast. CONTRAST:  56mL MULTIHANCE GADOBENATE DIMEGLUMINE 529 MG/ML IV SOLN COMPARISON:  Previous MRI from 12/25/2015. FINDINGS: MRI THORACIC SPINE FINDINGS Alignment: Examination mildly limited as the patient was unable to tolerate the full length of the exam. Postcontrast axial sequences as well as post-contrast imaging through the lumbar spine was not performed. Stable alignment with preservation of the normal thoracic kyphosis. No listhesis. Vertebrae: Postoperative changes from prior fusion from T1 through T5 for T3 decompression again seen. Pathologic fracture deformity of T3 is relatively unchanged in appearance. Interval growth with progressive tumor seen within the adjacent spinal canal, extending from the mid aspect of T2 through the mid aspect of T4 (series 7, image 8). There has been marked interval worsening of cord and thecal sac progression, with the sac compressed laterally and slit like in appearance, measuring approximately 2 mm in transverse diameter at its most narrow point (series 10, image 12). Suspected cord signal abnormality at this level, although poorly visualized due to susceptibility artifact from adjacent hardware. Postoperative fluid collection again noted at the adjacent laminectomy site, likely slightly decreased in size. No other  metastatic lesions identified. Reactive marrow edema and enhancement about the right T12-L1 facet favored to be degenerative in nature (series 6, image 4). Vertebral body heights otherwise maintained. Mild discogenic reactive endplate changes present at the thoracolumbar junction. Cord:  No other cord signal abnormality identified. Paraspinal and other soft tissues: No other acute paraspinous soft tissue abnormality. Probable mild cholelithiasis noted. Visualized visceral structures otherwise unremarkable. Disc levels: T7-8: Central disc extrusion flattens the ventral  thoracic spinal cord and resultant moderate spinal stenosis, stable. Left paracentral disc protrusion at T8-9 with mild flattening of the left hemi cord also unchanged. MRI LUMBAR SPINE FINDINGS Segmentation: Normal segmentation. Lowest well-formed disc labeled the L5-S1 level. Alignment: Levoscoliosis. Vertebral bodies otherwise normally aligned with preservation of the normal lumbar lordosis. No listhesis. Vertebrae: Mild height loss with reactive endplate edema at the left aspect of the superior endplate of L4, suspicious for mild compression fracture (series 14, image 12). No bony retropulsion. This is benign in appearance, with no underlying lesion identified. No worrisome osseous lesions or evidence for metastatic disease identified within the lumbar spine. Prominent reactive marrow edema about the left L3-4 facet due to advanced facet degeneration. Conus medullaris: Extends to the L1 level and appears normal. Paraspinal and other soft tissues: Paraspinous soft tissues within normal limits. Visualized visceral structures unremarkable. Disc levels: L1-2:  Mild facet and ligament flavum hypertrophy.  No stenosis. L2-3: Minimal disc bulge with bilateral facet hypertrophy. No significant stenosis. L3-4: Disc desiccation with minimal disc bulge. Prominent anterior bridging osteophyte. Advanced bilateral facet degeneration with associated reactive  marrow edema, left greater than right. 7 mm synovial cyst noted at the posterior aspect of the right L3-4 facet. Mild canal with bilateral L3 foraminal stenosis. L4-5: Chronic intervertebral disc mild diffuse disc bulge. Moderate to advanced facet arthrosis, left greater than right. Resultant moderate canal with bilateral subarticular stenosis. Foramina remain patent. L5-S1: Chronic intervertebral disc space narrowing with diffuse disc bulge. Left greater than right facet degeneration. Resultant moderate to severe left lateral recess narrowing. Mild right with mild to moderate left L5 foraminal stenosis. IMPRESSION: MRI THORACIC SPINE IMPRESSION 1. 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 Neuro surgical consultation recommended. 2. Superimposed sequelae of prior T1 through T5 fusion for T3 decompression. 3. Stable disc protrusions at T7-8 and T8-9. 4. Prominent facet arthropathy at T12-L1 on the right with associated reactive marrow edema. MRI LUMBAR SPINE IMPRESSION 1. No evidence for metastatic disease within the lumbar spine. 2. Mild acute to subacute endplate compression deformity involving the left aspect of the superior endplate of L4. This is benign in appearance with no underlying pathologic lesion. 3. Advanced bilateral facet degeneration at L3-4 with associated reactive marrow edema, left greater than right. 4. Multilevel degenerative spondylolysis as above, most notable at L4-5 and L5-S1. Resultant moderate canal stenosis at L4-5, with moderate to severe left lateral recess narrowing at L5-S1. Critical Value/emergent results were called by telephone at the time of interpretation on 09/02/2017 at 8:30 pm to the ER PA [place] , who verbally acknowledged these results. Electronically Signed   By: Jeannine Boga M.D.   On: 09/02/2017 20:31   Mr Thoracic Spine W Wo Contrast  Result Date:  09/02/2017 CLINICAL DATA:  Initial evaluation for increased lower extremity weakness with history of metastatic Hurthle cell carcinoma to the thoracic spine. T3 metastasis with history of prior decompression. EXAM: MRI THORACIC AND LUMBAR SPINE WITHOUT AND WITH CONTRAST TECHNIQUE: Multiplanar and multiecho pulse sequences of the thoracic and lumbar spine were obtained without and with intravenous contrast. CONTRAST:  81mL MULTIHANCE GADOBENATE DIMEGLUMINE 529 MG/ML IV SOLN COMPARISON:  Previous MRI from 12/25/2015. FINDINGS: MRI THORACIC SPINE FINDINGS Alignment: Examination mildly limited as the patient was unable to tolerate the full length of the exam. Postcontrast axial sequences as well as post-contrast imaging through the lumbar spine was not performed. Stable alignment with preservation  of the normal thoracic kyphosis. No listhesis. Vertebrae: Postoperative changes from prior fusion from T1 through T5 for T3 decompression again seen. Pathologic fracture deformity of T3 is relatively unchanged in appearance. Interval growth with progressive tumor seen within the adjacent spinal canal, extending from the mid aspect of T2 through the mid aspect of T4 (series 7, image 8). There has been marked interval worsening of cord and thecal sac progression, with the sac compressed laterally and slit like in appearance, measuring approximately 2 mm in transverse diameter at its most narrow point (series 10, image 12). Suspected cord signal abnormality at this level, although poorly visualized due to susceptibility artifact from adjacent hardware. Postoperative fluid collection again noted at the adjacent laminectomy site, likely slightly decreased in size. No other metastatic lesions identified. Reactive marrow edema and enhancement about the right T12-L1 facet favored to be degenerative in nature (series 6, image 4). Vertebral body heights otherwise maintained. Mild discogenic reactive endplate changes present at the  thoracolumbar junction. Cord:  No other cord signal abnormality identified. Paraspinal and other soft tissues: No other acute paraspinous soft tissue abnormality. Probable mild cholelithiasis noted. Visualized visceral structures otherwise unremarkable. Disc levels: T7-8: Central disc extrusion flattens the ventral thoracic spinal cord and resultant moderate spinal stenosis, stable. Left paracentral disc protrusion at T8-9 with mild flattening of the left hemi cord also unchanged. MRI LUMBAR SPINE FINDINGS Segmentation: Normal segmentation. Lowest well-formed disc labeled the L5-S1 level. Alignment: Levoscoliosis. Vertebral bodies otherwise normally aligned with preservation of the normal lumbar lordosis. No listhesis. Vertebrae: Mild height loss with reactive endplate edema at the left aspect of the superior endplate of L4, suspicious for mild compression fracture (series 14, image 12). No bony retropulsion. This is benign in appearance, with no underlying lesion identified. No worrisome osseous lesions or evidence for metastatic disease identified within the lumbar spine. Prominent reactive marrow edema about the left L3-4 facet due to advanced facet degeneration. Conus medullaris: Extends to the L1 level and appears normal. Paraspinal and other soft tissues: Paraspinous soft tissues within normal limits. Visualized visceral structures unremarkable. Disc levels: L1-2:  Mild facet and ligament flavum hypertrophy.  No stenosis. L2-3: Minimal disc bulge with bilateral facet hypertrophy. No significant stenosis. L3-4: Disc desiccation with minimal disc bulge. Prominent anterior bridging osteophyte. Advanced bilateral facet degeneration with associated reactive marrow edema, left greater than right. 7 mm synovial cyst noted at the posterior aspect of the right L3-4 facet. Mild canal with bilateral L3 foraminal stenosis. L4-5: Chronic intervertebral disc mild diffuse disc bulge. Moderate to advanced facet arthrosis,  left greater than right. Resultant moderate canal with bilateral subarticular stenosis. Foramina remain patent. L5-S1: Chronic intervertebral disc space narrowing with diffuse disc bulge. Left greater than right facet degeneration. Resultant moderate to severe left lateral recess narrowing. Mild right with mild to moderate left L5 foraminal stenosis. IMPRESSION: MRI THORACIC SPINE IMPRESSION 1. 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 Neuro surgical consultation recommended. 2. Superimposed sequelae of prior T1 through T5 fusion for T3 decompression. 3. Stable disc protrusions at T7-8 and T8-9. 4. Prominent facet arthropathy at T12-L1 on the right with associated reactive marrow edema. MRI LUMBAR SPINE IMPRESSION 1. No evidence for metastatic disease within the lumbar spine. 2. Mild acute to subacute endplate compression deformity involving the left aspect of the superior endplate of L4. This is benign in appearance with no underlying pathologic lesion. 3. Advanced bilateral facet degeneration  at L3-4 with associated reactive marrow edema, left greater than right. 4. Multilevel degenerative spondylolysis as above, most notable at L4-5 and L5-S1. Resultant moderate canal stenosis at L4-5, with moderate to severe left lateral recess narrowing at L5-S1. Critical Value/emergent results were called by telephone at the time of interpretation on 09/02/2017 at 8:30 pm to the ER PA [place] , who verbally acknowledged these results. Electronically Signed   By: Jeannine Boga M.D.   On: 09/02/2017 20:31   US Abdomen Complete  Result Date: 08/10/2017 CLINICAL DATA:  Elevated bilirubin level. EXAM: ABDOMEN ULTRASOUND COMPLETE COMPARISON:  CT scan of Aug 09, 2017. FINDINGS: Gallbladder: Multiple gallstones are noted with the largest measuring 1.1 cm. No gallbladder wall thickening or pericholecystic fluid is noted. No  sonographic Murphy's sign is noted. Common bile duct: Diameter: 2.8 mm which is within normal limits. Liver: No focal lesion identified. Within normal limits in parenchymal echogenicity. Portal vein is patent on color Doppler imaging with normal direction of blood flow towards the liver. IVC: No abnormality visualized. Pancreas: Not well visualized due to overlying bowel gas. Spleen: Size and appearance within normal limits. Right Kidney: Length: 11 cm. Echogenicity within normal limits. No mass or hydronephrosis visualized. Left Kidney: Length: 10.2 cm. Echogenicity within normal limits. No mass or hydronephrosis visualized. Abdominal aorta: No aneurysm visualized. Other findings: None. IMPRESSION: Cholelithiasis without inflammation. Pancreas not well visualized due to overlying bowel gas. No other abnormality seen in the abdomen. Electronically Signed   By: Marijo Conception, M.D.   On: 08/10/2017 14:32   Nm Hepato W/eject Fract  Result Date: 08/11/2017 CLINICAL DATA:  RIGHT upper quadrant pain for few days EXAM: NUCLEAR MEDICINE HEPATOBILIARY IMAGING WITH GALLBLADDER EF TECHNIQUE: Sequential images of the abdomen were obtained out to 60 minutes following intravenous administration of radiopharmaceutical. After slow intravenous infusion of 1.91 micrograms Cholecystokinin, gallbladder ejection fraction was determined. RADIOPHARMACEUTICALS:  5.5 mCi Tc-70m Choletec IV COMPARISON:  Ultrasound abdomen 08/10/2017 FINDINGS: Normal tracer extraction from bloodstream indicating normal hepatocellular function. Normal excretion of tracer into biliary tree. Gallbladder visualized at 25 min, though no additional tracer is seen within the gallbladder until 58 minutes. Minimal tracer within gallbladder prior to CCK administration. Small bowel visualized at 12 min. No hepatic retention of tracer. Subjectively very little to no emptying of tracer from gallbladder following CCK administration. Calculated gallbladder ejection  fraction is likely inaccurate due to superimposed small bowel loops with the gallbladder near the conclusion of the post CCK images. Patient reported no symptoms following CCK administration. Normal gallbladder ejection fraction following CCK stimulation is greater than 40% at 1 hour. IMPRESSION: Patent biliary tree. Suboptimal filling of gallbladder prior to CCK with limited assessment of gallbladder emptying post CCK due to superimposed bowel and poor filling, unable to generate an accurate gallbladder ejection fraction but subjectively there is very poor/little emptying following CCK stimulation. Electronically Signed   By: Lavonia Dana M.D.   On: 08/11/2017 13:13       IMPRESSION/PLAN: 1. 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 The patient's case has been reviewed and discussed with Dr. [name], Dr. Christella Noa, and [name]. I spent time today reviewing the findings to date and reviewing with the patient and the recommendations to give Dr. Christella Noa a chance to review her films. Although I'm not sure if he will recommend surgical decompression, we will follow up with the patient today to determine her course. She is in a difficult position due to her  disease, however if this is the only site of progression, and if she is able to gain local control with either surgery or radiotherapy, she could have good quantity of life. The patient acknowledges her understanding of the fact that despite these efforts she may remain paralyzed.  We discussed the risks, benefits, short, and long term effects of radiotherapy in the setting of re-irradiation, and she is willing to take on either risk, surgical or radiotherapy induced. I will follow up with her later today to determine our course.  In a visit lasting 70 minutes, greater than 50% of the time was spent discussing her case in floor time, and coordinating the patient's care.      Carola Rhine, PAC

## 2017-09-03 NOTE — Anesthesia Preprocedure Evaluation (Addendum)
Anesthesia Evaluation  Patient identified by MRN, date of birth, ID band Patient awake    Reviewed: Allergy & Precautions, NPO status , Patient's Chart, lab work & pertinent test results  History of Anesthesia Complications Negative for: history of anesthetic complications  Airway Mallampati: II  TM Distance: >3 FB Neck ROM: Full    Dental  (+) Poor Dentition, Loose, Teeth Intact,    Pulmonary neg pulmonary ROS,    breath sounds clear to auscultation       Cardiovascular hypertension, Pt. on medications  Rhythm:Regular Rate:Normal     Neuro/Psych PSYCHIATRIC DISORDERS Depression negative neurological ROS     GI/Hepatic Neg liver ROS, GERD  Medicated and Controlled,  Endo/Other  diabetesHypothyroidism Morbid obesity  Renal/GU negative Renal ROS     Musculoskeletal  (+) Arthritis ,   Abdominal   Peds  Hematology   Anesthesia Other Findings   Reproductive/Obstetrics                            Anesthesia Physical Anesthesia Plan  ASA: III  Anesthesia Plan: General   Post-op Pain Management:    Induction: Intravenous  PONV Risk Score and Plan: 3 and Ondansetron and Dexamethasone  Airway Management Planned: Oral ETT  Additional Equipment: None  Intra-op Plan:   Post-operative Plan: Extubation in OR  Informed Consent: I have reviewed the patients History and Physical, chart, labs and discussed the procedure including the risks, benefits and alternatives for the proposed anesthesia with the patient or authorized representative who has indicated his/her understanding and acceptance.   Dental advisory given  Plan Discussed with: CRNA and Surgeon  Anesthesia Plan Comments:         Anesthesia Quick Evaluation

## 2017-09-03 NOTE — Transfer of Care (Signed)
Immediate Anesthesia Transfer of Care Note  Patient: Sarah [name]  Procedure(s) Performed: POSTERIOR SPINAL TUMOR RESECTION THORACIC THREE (N/A Spine Thoracic)  Patient Location: PACU  Anesthesia Type:General  Level of Consciousness: awake, alert  and oriented  Airway & Oxygen Therapy: Patient Spontanous Breathing  Post-op Assessment: Report given to RN and Post -op Vital signs reviewed and stable  Post vital signs: Reviewed and stable  Last Vitals:  Vitals Value Taken Time  BP 102/58 09/03/2017  8:55 PM  Temp    Pulse 108 09/03/2017  8:56 PM  Resp 13 09/03/2017  8:56 PM  SpO2 98 % 09/03/2017  8:56 PM  Vitals shown include unvalidated device data.  Last Pain:  Vitals:   09/03/17 1330  TempSrc: Oral  PainSc:       Patients Stated Pain Goal: 3 (62/26/33 3545)  Complications: No apparent anesthesia complications

## 2017-09-04 ENCOUNTER — Encounter (HOSPITAL_COMMUNITY): Payer: Self-pay | Admitting: Neurosurgery

## 2017-09-04 LAB — HEMOGLOBIN AND HEMATOCRIT, BLOOD
HCT: 25.7 % — ABNORMAL LOW (ref 36.0–46.0)
HEMOGLOBIN: 8.5 g/dL — AB (ref 12.0–15.0)

## 2017-09-04 LAB — TYPE AND SCREEN
ABO/RH(D): O POS
Antibody Screen: NEGATIVE
UNIT DIVISION: 0
Unit division: 0

## 2017-09-04 LAB — BPAM RBC
Blood Product Expiration Date: 201907132359
Blood Product Expiration Date: 201907132359
ISSUE DATE / TIME: 201906132044
ISSUE DATE / TIME: 201906132044
UNIT TYPE AND RH: 5100
Unit Type and Rh: 5100

## 2017-09-04 NOTE — Evaluation (Signed)
Occupational Therapy Evaluation Patient Details Name: [name] MRN: [id number] DOB: 21-Dec-1965 Today's Date: 09/04/2017    History of Present Illness 52 y.o. female s/p posterior spinal tumor resection at T 3 on 6/13. Pt with thyroid cancer with METs to spine. Pt experiencing increased LE weakness and experienced fall on 6/12 with bed>w/c transfer. 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 She demonstrates the below listed deficits and will benefit from continued OT to maximize safety and independence with BADLs.  Pt presents to OT with deficits consistent with T5 paraplegia.  She is able to maintain EOC sitting with unilateral support and min guard assist for 1.5 mins while performing reaching activities. She requires min A for UB ADLs and max - total A for LB ADLs.  PTA, she lived with family who assisted intermittently.  And relied mostly on use of w/c for mobility.  She was mod I - min A for ADLs.  Feel she would benefit from CIR where she will have therapists knowledgeable about SCI, and from team collaboration to allow her to maximize her potential.  Will follow acutely.      Follow Up Recommendations  CIR;Supervision/Assistance - 24 hour    Equipment Recommendations  None recommended by OT    Recommendations for Other Services Rehab consult     Precautions / Restrictions Precautions Precautions: Fall;Back Precaution Booklet Issued: No Restrictions Weight Bearing Restrictions: No      Mobility Bed Mobility Overal bed mobility: Needs Assistance Bed Mobility: Supine to Sit     Supine to sit: +2 for physical assistance;Max assist     General bed mobility comments: pt up in chair   Transfers Overall transfer level: Needs assistance Equipment used: None Transfers: Squat Pivot Transfers     Squat pivot transfers: +2 physical assistance;Max assist     General transfer comment: unable to safely attempt with  +1 assist     Balance Overall balance assessment: Needs assistance Sitting-balance support: Feet supported;No upper extremity supported Sitting balance-Leahy Scale: Poor Sitting balance - Comments: reliant on UE support                                    ADL either performed or assessed with clinical judgement   ADL Overall ADL's : Needs assistance/impaired Eating/Feeding: Independent   Grooming: Wash/dry hands;Wash/dry face;Oral care;Brushing hair;Set up;Sitting(supported sitting)   Upper Body Bathing: Set up;Sitting(supported sitting )   Lower Body Bathing: Maximal assistance;Bed level;Sitting/lateral leans   Upper Body Dressing : Minimal assistance;Sitting   Lower Body Dressing: Total assistance;Sit to/from stand   Toilet Transfer: Total assistance;BSC   Toileting- Clothing Manipulation and Hygiene: Total assistance;Bed level       Functional mobility during ADLs: Total assistance General ADL Comments: Pt T4 para.  Requires UE support to maintain unsupported sitting, and unable to access feet      Vision Patient Visual Report: No change from baseline       Perception     Praxis      Pertinent Vitals/Pain Pain Assessment: Faces Pain Score: 4  Faces Pain Scale: Hurts little more Pain Location: Mid-Back Pain Descriptors / Indicators: Operative site guarding Pain Intervention(s): Monitored during session;Premedicated before session     Hand Dominance Right   Extremity/Trunk Assessment Upper Extremity Assessment Upper Extremity Assessment: RUE deficits/detail RUE Deficits / Details: pain Rt shoulder with limited  active shoulder ROM (~115) due to rotator cuff tendonitis    Lower Extremity Assessment Lower Extremity Assessment: Defer to PT evaluation RLE Deficits / Details: Trace knee extension movement; Trace hip ER movement. No ankle DF, hip flexion, hip IR detected RLE Sensation: decreased light touch LLE Deficits / Details: Trace knee  extension movement; Trace hip ER movement. No ankle DF, hip flexion, hip IR detected. Increased tone noted in BLE LLE Sensation: decreased light touch(Some LT sensation but diminished)   Cervical / Trunk Assessment Cervical / Trunk Assessment: Other exceptions Cervical / Trunk Exceptions: decreased active control of trunk T4 paraplegia    Communication Communication Communication: No difficulties   Cognition Arousal/Alertness: Awake/alert Behavior During Therapy: WFL for tasks assessed/performed Overall Cognitive Status: Within Functional Limits for tasks assessed                                     General Comments  VSS    Exercises Exercises: Other exercises Other Exercises Other Exercises: with pt sitting EOC, worked on reaching with each UE x 1.5 mins x 2.  she fatigues quickly    Shoulder Instructions      Home Living Family/patient expects to be discharged to:: Inpatient rehab Living Arrangements: Other relatives Available Help at Discharge: Available PRN/intermittently Type of Home: House Home Access: Ramped entrance     Home Layout: One level     Bathroom Shower/Tub: Tub/shower unit(pt unable to access BR)   Bathroom Toilet: Handicapped height(pt unable to access BR) Bathroom Accessibility: No   Home Equipment: Walker - 2 wheels;Bedside commode;Shower seat;Wheelchair - power;Wheelchair - manual;Hospital bed;Other (comment)(sliding board)          Prior Functioning/Environment Level of Independence: Needs assistance  Gait / Transfers Assistance Needed: Slide board transfers and ambulating short distances with RW prior to decline ADL's / Homemaking Assistance Needed: Pt performs bed/sink baths. Family performed cooking/cleaning tasks   Comments: Pt uses manual chair most often for past 3 years. Pt was using RW for short distances with therapy only  until May 2019.        OT Problem List: Decreased strength;Decreased activity  tolerance;Impaired balance (sitting and/or standing);Decreased safety awareness;Decreased knowledge of use of DME or AE;Pain;Decreased range of motion      OT Treatment/Interventions: Self-care/ADL training;Therapeutic exercise;Neuromuscular education;DME and/or AE instruction;Therapeutic activities;Patient/family education;Balance training    OT Goals(Current goals can be found in the care plan section) Acute Rehab OT Goals Patient Stated Goal: "to get back doing like I was"  OT Goal Formulation: With patient Time For Goal Achievement: 09/18/17 Potential to Achieve Goals: Good ADL Goals Pt Will Perform Upper Body Dressing: with min guard assist;sitting Pt Will Transfer to Toilet: with mod assist;with transfer board;bedside commode Pt Will Perform Toileting - Clothing Manipulation and hygiene: with max assist;sitting/lateral leans Additional ADL Goal #1: Pt will maintain long sitting x 5 mins with min guard assist in prep for LB ADLs. Additional ADL Goal #2: Pt will circle sit x 5 mins with min guard assist in prep for ADLs  OT Frequency: Min 2X/week   Barriers to D/C: Decreased caregiver support  unsure if family able to provide necessary level of assist        Co-evaluation              AM-PAC PT "6 Clicks" Daily Activity     Outcome Measure Help from another person eating meals?: None Help from  another person taking care of personal grooming?: A Little Help from another person toileting, which includes using toliet, bedpan, or urinal?: Total Help from another person bathing (including washing, rinsing, drying)?: A Lot Help from another person to put on and taking off regular upper body clothing?: A Little Help from another person to put on and taking off regular lower body clothing?: Total 6 Click Score: 14   End of Session    Activity Tolerance: Patient tolerated treatment well Patient left: in chair  OT Visit Diagnosis: Muscle weakness (generalized)  (M62.81);Pain Pain - part of body: (back)                Time: 6440-3474 OT Time Calculation (min): 26 min Charges:  OT General Charges $OT Visit: 1 Visit OT Evaluation $OT Eval Moderate Complexity: 1 Mod OT Treatments $Therapeutic Activity: 8-22 mins G-Codes:     [facility], OTR/L [phone]   Lucille Passy M 09/04/2017, 3:49 PM

## 2017-09-04 NOTE — Progress Notes (Signed)
Rehab Admissions Coordinator Note:  Patient was screened by Cleatrice Burke for appropriateness for an Inpatient Acute Rehab Consult per PT recommendation.  At this time, we are recommending Inpatient Rehab consult if pt would like to be considered for inpt rehab admit. Please advise.  Danne Baxter, RN, MSN Rehab Admissions Coordinator [phone] 09/04/2017 2:58 PM

## 2017-09-04 NOTE — Social Work (Signed)
CSW acknowledging consult for SNF placement, await PT/OT evaluations needed for insurance authorization and to determine level of care needed pos discharge.   CSW following.  Alexander Mt, [place] [facility] [phone]

## 2017-09-04 NOTE — Evaluation (Signed)
Physical Therapy Evaluation Patient Details Name: Sarah [name] MRN: [id number] DOB: 10-16-1965 Today's Date: 09/04/2017   History of Present Illness  52 y.o. female s/p posterior spinal tumor resection at T 3 on 6/13. Pt with thyroid cancer with METs to spine. Pt experiencing increased LE weakness and experienced fall on 6/12 with bed>w/c transfer. PMH: Cancer, HTN, DJD, Paraplegia @ T4 (2015), PE  Clinical Impression  Pt pleasant but anxious to perform mobility due to fear of falling. Pt demonstrates strength and sensation deficits in BLE resulting in diminished function compared to PLOF. Pt requiring max A x 2 for bed>chair transfer. Pt would benefit from skilled PT to address functional impairments and improve level of independence. CIR consult recommended.    Follow Up Recommendations CIR;Supervision/Assistance - 24 hour    Equipment Recommendations  Other (comment)(TBD)    Recommendations for Other Services Rehab consult     Precautions / Restrictions Precautions Precautions: Fall;Back Precaution Booklet Issued: No Restrictions Weight Bearing Restrictions: No      Mobility  Bed Mobility Overal bed mobility: Needs Assistance Bed Mobility: Supine to Sit     Supine to sit: +2 for physical assistance;Max assist     General bed mobility comments: Max A for BLE and Mod A for trunk to bring pt upright at EOB. PT then used pad under pt to bring pt to EOB. Pt anxious and fears falling with sitting EOB  Transfers Overall transfer level: Needs assistance Equipment used: None Transfers: Squat Pivot Transfers     Squat pivot transfers: +2 physical assistance;Max assist     General transfer comment: Max A x 2 for transfer bed>chair  Ambulation/Gait                Stairs            Wheelchair Mobility    Modified Rankin (Stroke Patients Only)       Balance Overall balance assessment: Needs assistance Sitting-balance support: Feet supported;No upper  extremity supported Sitting balance-Leahy Scale: Fair Sitting balance - Comments: Pt guarded closely at EOB due to fear of falling and dizziness reported. BP taken: 109/71                                     Pertinent Vitals/Pain Pain Assessment: 0-10 Pain Score: 4  Pain Location: Mid-Back Pain Descriptors / Indicators: Discomfort;Grimacing Pain Intervention(s): Limited activity within patient's tolerance;Monitored during session;RN gave pain meds during session    [place] expects to be discharged to:: Inpatient rehab Living Arrangements: Other relatives(sister and brother in law) Available Help at Discharge: Available PRN/intermittently Type of Home: House Home Access: Ramped entrance     Home Layout: One level Home Equipment: Walker - 2 wheels;Bedside commode;Shower seat;Wheelchair - power;Wheelchair - manual Additional Comments: Family reports that manual chair is falling apart    Prior Function Level of Independence: Needs assistance   Gait / Transfers Assistance Needed: Slide board transfers and ambulating short distances with RW prior to decline  ADL's / Homemaking Assistance Needed: Pt performs bed/sink baths. Family performed cooking/cleaning tasks  Comments: Pt uses manual chair most often for past 3 years. Pt was using RW for short distances until May 2019.     Hand Dominance        Extremity/Trunk Assessment   Upper Extremity Assessment Upper Extremity Assessment: Overall WFL for tasks assessed    Lower Extremity Assessment Lower Extremity Assessment: RLE  deficits/detail;LLE deficits/detail RLE Deficits / Details: Trace knee extension movement; Trace hip ER movement. No ankle DF, hip flexion, hip IR detected RLE Sensation: decreased light touch LLE Deficits / Details: Trace knee extension movement; Trace hip ER movement. No ankle DF, hip flexion, hip IR detected. Increased tone noted in BLE LLE Sensation: decreased light  touch(Some LT sensation but diminished)    Cervical / Trunk Assessment Cervical / Trunk Assessment: Kyphotic  Communication   Communication: No difficulties  Cognition Arousal/Alertness: Awake/alert Behavior During Therapy: WFL for tasks assessed/performed Overall Cognitive Status: Within Functional Limits for tasks assessed                                        General Comments General comments (skin integrity, edema, etc.): Incision site at mid back bleeding and IV site on LUE leaking. RN notified.     Exercises     Assessment/Plan    PT Assessment Patient needs continued PT services  PT Problem List Decreased strength;Decreased range of motion;Decreased activity tolerance;Decreased balance;Decreased mobility;Decreased coordination;Decreased knowledge of use of DME;Decreased safety awareness;Decreased knowledge of precautions;Impaired sensation;Impaired tone;Obesity;Decreased skin integrity       PT Treatment Interventions DME instruction;Gait training;Functional mobility training;Therapeutic activities;Therapeutic exercise;Balance training;Neuromuscular re-education;Patient/family education;Wheelchair mobility training    PT Goals (Current goals can be found in the Care Plan section)  Acute Rehab PT Goals Patient Stated Goal: To go to inpatient rehab PT Goal Formulation: With patient/family Time For Goal Achievement: 09/18/17 Potential to Achieve Goals: Good    Frequency Min 4X/week   Barriers to discharge Decreased caregiver support Lack of 24 hour support    Co-evaluation               AM-PAC PT "6 Clicks" Daily Activity  Outcome Measure Difficulty turning over in bed (including adjusting bedclothes, sheets and blankets)?: Unable Difficulty moving from lying on back to sitting on the side of the bed? : Unable Difficulty sitting down on and standing up from a chair with arms (e.g., wheelchair, bedside commode, etc,.)?: Unable Help needed  moving to and from a bed to chair (including a wheelchair)?: Total Help needed walking in hospital room?: Total Help needed climbing 3-5 steps with a railing? : Total 6 Click Score: 6    End of Session Equipment Utilized During Treatment: Gait belt Activity Tolerance: Patient tolerated treatment well Patient left: in chair;with chair alarm set;with nursing/sitter in room;with call bell/phone within reach;with family/visitor present Nurse Communication: Mobility status;Need for lift equipment PT Visit Diagnosis: Other abnormalities of gait and mobility (R26.89);Unsteadiness on feet (R26.81);Other symptoms and signs involving the nervous system (R29.898)    Time: 9741-6384 PT Time Calculation (min) (ACUTE ONLY): 38 min   Charges:   PT Evaluation $PT Eval Moderate Complexity: 1 Mod PT Treatments $Therapeutic Activity: 23-37 mins   PT G Codes:        Gabe Ezechiel [place], SPT  [employer] 09/04/2017, 2:05 PM

## 2017-09-04 NOTE — Progress Notes (Signed)
Patient ID: Sarah [name], female   DOB: 04-28-1965, 52 y.o.   MRN: [id number] Alert and oriented x4, speech is clear, and fluent Wound is clean, dry, no signs of infection Will transfer to floor No neuro change

## 2017-09-05 LAB — URINE CULTURE

## 2017-09-05 NOTE — Progress Notes (Signed)
[place] for Arixtra Indication: hx of  DVT and PE  Patient Measurements: Height: 5' 10.98" (180.3 cm) Weight: 218 lb 4.1 oz (99 kg) IBW/kg (Calculated) : 70.76  Vital Signs: Temp: 97.8 F (36.6 C) (06/15 1200) Temp Source: Oral (06/15 1200) BP: 118/83 (06/15 1200) Pulse Rate: 83 (06/15 1200)  Labs: Recent Labs    09/02/17 1506 09/03/17 2040 09/04/17 0238  HGB 10.9* 6.1* 8.5*  HCT 34.0* 18.0* 25.7*  PLT 190  --   --   CREATININE 0.98  --   --     Estimated Creatinine Clearance: 88 mL/min (by C-G formula based on SCr of 0.98 mg/dL).  Medications:  Scheduled:  . baclofen  20 mg Oral TID  . celecoxib  200 mg Oral Q12H  . dexamethasone  4 mg Intravenous Q6H  . docusate sodium  100 mg Oral BID  . fluticasone  2 spray Each Nare QHS  . fondaparinux (ARIXTRA) injection  7.5 mg Subcutaneous Q0600  . levothyroxine  137 mcg Oral QAC breakfast  . lisinopril  40 mg Oral Daily  . pantoprazole  40 mg Oral Daily  . senna  2 tablet Oral Daily  . sertraline  25 mg Oral Daily  . sodium chloride flush  3 mL Intravenous Q12H   Infusions:  . sodium chloride    . sodium chloride    . 0.9 % NaCl with KCl 20 mEq / L 80 mL/hr at 09/05/17 0600  . 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 She was on xarelto PTA. Dose remains appropriate and no bleeding noted.    Plan:  Continue arixtra 7.5mg  SQ daily F/u ability to restart xarelto  Breck [place], Rande Lawman 09/05/2017,1:56 PM

## 2017-09-05 NOTE — Progress Notes (Signed)
Subjective: Patient reports has some feeling in feet  Objective: Vital signs in last 24 hours: Temp:  [97.7 F (36.5 C)-98.4 F (36.9 C)] 98.4 F (36.9 C) (06/15 0400) Pulse Rate:  [63-87] 63 (06/15 0800) Resp:  [11-22] 11 (06/15 0800) BP: (99-125)/(62-80) 121/72 (06/15 0800) SpO2:  [98 %-100 %] 99 % (06/15 0800)  Intake/Output from previous day: 06/14 0701 - 06/15 0700 In: 1930 [I.V.:1920] Out: 901 [Urine:900; Stool:1] Intake/Output this shift: No intake/output data recorded.  Physical Exam: Spastic in both legs.  Briskly upgoing toes.  Says she can feel me touching the bottom of her feet.  Was able to use bed pan yesterday.  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 Worsening disease with cord compression on recent MRI. EXAM: CT THORACIC SPINE WITHOUT CONTRAST TECHNIQUE: Multidetector CT images of the thoracic were obtained using the standard protocol without intravenous contrast. COMPARISON:  Thoracic spine MRI 09/02/2017 FINDINGS: Alignment: Mildly exaggerated upper thoracic kyphosis. No listhesis. Vertebrae: Prior posterior fusion from T1-T5. Bilateral pedicle screws at each level except for T3. The screws appear intact without evidence of loosening about the T1, T2, or T5 screws. There is lucency throughout the posterosuperior aspect of the T4 vertebral body including about the distal portions of both T4 screws reflecting tumor as seen on MRI. There is a chronic, severe to pathologic T3  compression fracture. Extensive epidural tumor centered at the T3 level was more fully evaluated on yesterday's MRI. Tumor is again noted to involve the posterior and inferior aspects of the T2 vertebral body. There is slight chronic anterior wedging of the T8 vertebral body. No definite acute fracture is identified. Flowing anterior vertebral osteophytes are present throughout the thoracic spine extending into the lumbar spine. Paraspinal and other soft tissues: Unremarkable. Disc levels: Spondylosis more fully evaluated on yesterday's MRI, with a small calcified disc extrusion noted at T7-8. IMPRESSION: 1. Metastatic disease as described on yesterday's MRI with extensive osseous and epidural tumor from T2-T4. Chronic pathologic T3 compression fracture. 2. Prior T1-T5 posterior fusion. Tumor involves the T4 vertebral body about the distal aspects of both pedicle screws. Electronically Signed   By: Logan Bores M.D.   On: 09/03/2017 15:13    Assessment/Plan: Awaiting floor bed.  Slight improvement in sensory exam without objective motor improvement.  Plan on transfer to Rehab.    LOS: 3 days    Peggyann Shoals, MD 09/05/2017, 9:03 AM

## 2017-09-05 NOTE — Progress Notes (Signed)
Transferred patient via bed to 4N13. Receiving RN in room and aware of pt arrival.    During transfer to progressive bed hemovac became dislodged. MD Vertell Limber made aware no new orders.

## 2017-09-06 NOTE — Progress Notes (Signed)
Subjective: Patient reports no change  Objective: Vital signs in last 24 hours: Temp:  [97.8 F (36.6 C)-98.7 F (37.1 C)] 98 F (36.7 C) (06/16 0456) Pulse Rate:  [61-98] 61 (06/16 0456) Resp:  [12-24] 17 (06/16 0456) BP: (106-137)/(65-85) 123/76 (06/16 0456) SpO2:  [98 %-100 %] 100 % (06/16 0456)  Intake/Output from previous day: 06/15 0701 - 06/16 0700 In: 1943 [P.O.:720; I.V.:1123; IV Piggyback:100] Out: 850 [Urine:750; Drains:100] Intake/Output this shift: No intake/output data recorded.  Physical Exam: Still with some sensation in right leg.  No improvement in motor function.  Lab Results: Recent Labs    09/03/17 2040 09/04/17 0238  HGB 6.1* 8.5*  HCT 18.0* 25.7*   BMET Recent Labs    09/03/17 2040  NA 138  K 4.4  GLUCOSE 160*    Studies/Results: No results found.  Assessment/Plan: [name] stable.  Adequate analgesia.  Awaiting placement.    LOS: 4 days    Peggyann Shoals, MD 09/06/2017, 9:56 AM

## 2017-09-06 NOTE — Progress Notes (Signed)
On bed transfer, hemovac became dislodged. Foam dressing used to reinforce honeycomb. RN to continue to monitor for drainage.   Checked several times throughout the night and there was only a scant amount of sanguineous drainage on foam dressing this morning when changed. Honeycomb has old drainage.

## 2017-09-07 DIAGNOSIS — C7951 Secondary malignant neoplasm of bone: Principal | ICD-10-CM

## 2017-09-07 DIAGNOSIS — G8220 Paraplegia, unspecified: Secondary | ICD-10-CM

## 2017-09-07 MED ORDER — FONDAPARINUX SODIUM 10 MG/0.8ML ~~LOC~~ SOLN
10.0000 mg | Freq: Every day | SUBCUTANEOUS | Status: DC
  Administered 2017-09-08: 10 mg via SUBCUTANEOUS
  Filled 2017-09-07 (×2): qty 0.8

## 2017-09-07 NOTE — Progress Notes (Signed)
Patient ID: Sarah [name], female   DOB: 1965/11/02, 52 y.o.   MRN: [id number] BP 105/64 (BP Location: Left Arm)   Pulse 80   Temp 98.5 F (36.9 C)   Resp 16   Ht 5' 10.98" (1.803 m)   Wt 105.6 kg (232 lb 12.9 oz)   LMP 10/22/2016   SpO2 100%   BMI 32.48 kg/m  Alert and oriented x 4 speech is clear and fluent Minimal voluntary movement in feet, some plantar flexion Wound is clean, dry, no signs of infection Awaiting rehab placement

## 2017-09-07 NOTE — Progress Notes (Signed)
Rehab admissions - I met with patient and gave her rehab booklets.  She would like inpatient rehab admission.  I have called and opened the case with [facility] medicare.  Will await callback from insurance case manager.  Call me for questions.  #[phone]

## 2017-09-07 NOTE — PMR Pre-admission (Signed)
PMR Admission Coordinator Pre-Admission Assessment  Patient: Sarah [name] is an 53 y.o., female MRN: [id number] DOB: 10/09/1965 Height: 5' 10.98" (180.3 cm) Weight: 105.6 kg (232 lb 12.9 oz)              Insurance Information HMO: Yes   PPO:       PCP:       IPA:       80/20:       OTHER:  Group # [id number] PRIMARY: UHC medicare      Policy#: [id number]      Subscriber: patient CM Name: Vevelyn Royals      Phone#: [phone]     Fax#: [phone] Pre-Cert#: [id number] for 7 days with update due on 09/14/17      Employer: Disabled Benefits:  Phone #: [phone]     Name:  On line portal Eff. 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  Per chart review patient lives with sister and brother-in-law.  One level home with ramped entrance.  Up until recently she was using a sliding board for transfers and ambulating short distances with a rolling walker up until May 2019.  She had been using a  wheelchair most of the time for the past 3 years.  Patient has had 2 previous back surgeries at T3 in 2015 and 2017.  Patient with recent admission approximately 2-1/2 weeks ago for nausea and vomiting was later discharged to home.   Presented 09/02/2017 with recent fall and increasing leg weakness over the past 2 weeks.  Patient initially was to see her oncologist 08/24/2017 for MRI that was planned however could not make the appointment due to lower extremity weakness.  She did report some urinary incontinence.  MRI and imaging revealed tumor recurrence at T3 with severe spinal cord compression.  Underwent posterior spinal tumor resection [facility]3 09/03/2017 per Dr. Christella Noa.  Hospital course pain management.  Radiation oncology follow-up Dr. Dara Lords await possible plan for need for radiation therapy.  Decadron protocol as indicated.  Physical and occupational therapy evaluations completed with recommendations of physical medicine rehab consult.   Past Medical History  Past Medical History:  Diagnosis Date  . Cancer [facility])    FNA of thyroid positive for onconytic/hurthle cell carcinoma  . Chronic back pain   . DDD (degenerative disc disease), cervical   . DJD (degenerative joint disease)   . History of rectal fissure   . HIT (heparin-induced thrombocytopenia) ([place])   . Hypertension   . Obesity   . 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 Pulmonary embolus, right ([place]) 2015  .  Rotator cuff tendonitis right    Family History  family history includes Diabetes in her father, mother, sister, and sister; Hypertension in her father and mother; Stroke in her father.  Prior Rehab/Hospitalizations: Had outpatient rehab in 05/19 at [facility]  Has the patient had major surgery during 100 days prior to admission? No  Current Medications   Current Facility-Administered Medications:  .  0.9 %  sodium chloride infusion, , Intravenous, Continuous, Cabbell, [name], MD .  0.9 %  sodium chloride infusion, 250 mL, Intravenous, Continuous, Cabbell, [name], MD .  0.9 % NaCl with KCl 20 mEq/ L  infusion, , Intravenous, Continuous, Ashok Pall, MD, Last Rate: 80 mL/hr at 09/05/17 1602 .  acetaminophen (TYLENOL) tablet 650 mg, 650 mg, Oral, Q4H PRN  **OR** acetaminophen (TYLENOL) suppository 650 mg, 650 mg, Rectal, Q4H PRN, Ashok Pall, MD .  baclofen (LIORESAL) tablet 20 mg, 20 mg, Oral, TID, Ashok Pall, MD, 20 mg at 09/08/17 1007 .  cefTRIAXone (ROCEPHIN) 2 g in sodium chloride 0.9 % 100 mL IVPB, 2 g, Intravenous, QHS, Ashok Pall, MD, Stopped at 09/07/17 2225 .  celecoxib (CELEBREX) capsule 200 mg, 200 mg, Oral, Q12H, Ashok Pall, MD, 200 mg at 09/08/17 1007 .  dexamethasone (DECADRON) injection 4 mg, 4 mg, Intravenous, Q6H, Ashok Pall, MD, 4 mg at 09/08/17 4196 .  diazepam (VALIUM) tablet 5 mg, 5 mg, Oral, Q6H PRN, Ashok Pall, MD, 5 mg at 09/07/17 1159 .  docusate sodium (COLACE) capsule 100 mg, 100 mg, Oral, BID, Ashok Pall, MD, 100 mg at 09/06/17 0911 .  fluticasone (FLONASE) 50 MCG/ACT nasal spray 2 spray, 2 spray, Each Nare, QHS, Ashok Pall, MD, 2 spray at 09/07/17 0913 .  fondaparinux (ARIXTRA) injection 10 mg, 10 mg, Subcutaneous, Q0600, Romona Curls, RPH, 10 mg at 09/08/17 2229 .  HYDROcodone-acetaminophen (NORCO/VICODIN) 5-325 MG per tablet 1-2 tablet, 1-2 tablet, Oral, Q4H PRN, Ashok Pall, MD, 1 tablet at 09/08/17 7989 .  levothyroxine (SYNTHROID, LEVOTHROID) tablet 137 mcg, 137 mcg, Oral, QAC breakfast, Ashok Pall, MD, 137 mcg at 09/08/17 1008 .  lisinopril (PRINIVIL,ZESTRIL) tablet 40 mg, 40 mg, Oral, Daily, Ashok Pall, MD, 40 mg at 09/08/17 1007 .  magnesium citrate solution 1 Bottle, 1 Bottle, Oral, Once PRN, Ashok Pall, MD .  menthol-cetylpyridinium (CEPACOL) lozenge 3 mg, 1 lozenge, Oral, PRN **OR** phenol (CHLORASEPTIC) mouth spray 1 spray, 1 spray, Mouth/Throat, PRN, Ashok Pall, MD .  ondansetron (ZOFRAN) tablet 4 mg, 4 mg, Oral, Q6H PRN **OR** ondansetron (ZOFRAN) injection 4 mg, 4 mg, Intravenous, Q6H PRN, Ashok Pall, MD .  pantoprazole (PROTONIX) EC tablet 40 mg, 40 mg, Oral, Daily, Ashok Pall, MD, 40 mg at 09/08/17 1007 .  senna (SENOKOT) tablet 17.2 mg, 2 tablet, Oral, Daily,  Cabbell, [name], MD, 17.2 mg at 09/08/17 1007 .  senna-docusate (Senokot-S) tablet 1 tablet, 1 tablet, Oral, QHS PRN, Ashok Pall, MD .  sertraline (ZOLOFT) tablet 25 mg, 25 mg, Oral, Daily, Ashok Pall, MD, 25 mg at 09/08/17 1007 .  sodium chloride flush (NS) 0.9 % injection 3 mL, 3 mL, Intravenous, Q12H, Ashok Pall, MD, 3 mL at 09/07/17 2155 .  sodium chloride flush (NS) 0.9 % injection 3 mL, 3 mL, Intravenous, PRN, Ashok Pall, MD .  traZODone (DESYREL) tablet 50 mg, 50 mg, Oral, QHS PRN, Ashok Pall, MD, 50 mg at 09/03/17 0101 .  zolpidem (AMBIEN) tablet 5 mg, 5 mg, Oral, QHS PRN, Ashok Pall, MD  Patients Current Diet:  Diet Order  Diet regular Room service appropriate? Yes; Fluid consistency: Thin  Diet effective now          Precautions / Restrictions Precautions Precautions: Fall, Back Precaution Booklet Issued: No Restrictions Weight Bearing Restrictions: No   Has the patient had 2 or more falls or a fall with injury in the past year?Yes.  Patient reports 1 fall with injury to her left knee  Prior Activity Level Limited Community (1-2x/wk): Went out 3 X a week, was not driving.  Home Assistive Devices / Equipment Home Assistive Devices/Equipment: Wheelchair, Environmental consultant (specify type)(front wheel) Home Equipment: Environmental consultant - 2 wheels, Bedside commode, Shower seat, Wheelchair - power, Wheelchair - manual, Hospital bed, Other (comment)(sliding board)  Prior Device Use: Indicate devices/aids used by the patient prior to current illness, exacerbation or injury? Manual wheelchair and Walker  Prior Functional Level Prior Function Level of Independence: Needs assistance Gait / Transfers Assistance Needed: Slide board transfers and ambulating short distances with RW prior to decline ADL's / Homemaking Assistance Needed: Pt performs bed/sink baths. Family performed cooking/cleaning tasks Comments: Pt uses manual chair most often for past 3 years. Pt was using RW for  short distances with therapy only  until May 2019.  Self Care: Did the patient need help bathing, dressing, using the toilet or eating?  Independent  Indoor Mobility: Did the patient need assistance with walking from room to room (with or without device)? Independent  Stairs: Did the patient need assistance with internal or external stairs (with or without device)? Needed some help  Functional Cognition: Did the patient need help planning regular tasks such as shopping or remembering to take medications? Independent  Current Functional Level Cognition  Overall Cognitive Status: Within Functional Limits for tasks assessed Orientation Level: Oriented X4    Extremity Assessment (includes Sensation/Coordination)  Upper Extremity Assessment: RUE deficits/detail RUE Deficits / Details: pain Rt shoulder with limited active shoulder ROM (~115) due to rotator cuff tendonitis   Lower Extremity Assessment: Defer to PT evaluation RLE Deficits / Details: Trace knee extension movement; Trace hip ER movement. No ankle DF, hip flexion, hip IR detected RLE Sensation: decreased light touch LLE Deficits / Details: Trace knee extension movement; Trace hip ER movement. No ankle DF, hip flexion, hip IR detected. Increased tone noted in BLE LLE Sensation: decreased light touch(Some LT sensation but diminished)    ADLs  Overall ADL's : Needs assistance/impaired Eating/Feeding: Independent Grooming: Wash/dry hands, Wash/dry face, Oral care, Brushing hair, Set up, Sitting(supported sitting) Upper Body Bathing: Set up, Sitting(supported sitting ) Lower Body Bathing: Maximal assistance, Bed level, Sitting/lateral leans Upper Body Dressing : Minimal assistance, Sitting Lower Body Dressing: Total assistance, Sit to/from stand Toilet Transfer: Total assistance, BSC Toileting- Clothing Manipulation and Hygiene: Total assistance, Bed level Functional mobility during ADLs: Total assistance General ADL Comments:  Pt T4 para.  Requires UE support to maintain unsupported sitting, and unable to access feet     Mobility  Overal bed mobility: Needs Assistance Bed Mobility: Rolling, Sidelying to Sit Rolling: Max assist Sidelying to sit: Total assist, Max assist Supine to sit: +2 for physical assistance, Max assist General bed mobility comments: significant assist at LE's  due to heavy spasticity.  Pt assisted with UE's and rail.    Transfers  Overall transfer level: Needs assistance Equipment used: None Transfer via Lift Equipment: Stedy Transfers: Sit to/from Stand Sit to Stand: Total assist, +2 physical assistance Squat pivot transfers: +2 physical assistance, Max assist General transfer comment: pt fearful of sitting in the  stedy for transfer.    Ambulation / Gait / Stairs / Office manager / Balance Dynamic Sitting Balance Sitting balance - Comments: reliant on UE support.  Sat EOB with min assist and UE support on the bed for 5-7 min.  Then sitting in the STEDY enclosed on the bed and then up in the stedy holeing to front bar. Balance Overall balance assessment: Needs assistance Sitting-balance support: Feet supported, Bilateral upper extremity supported Sitting balance-Leahy Scale: Poor Sitting balance - Comments: reliant on UE support.  Sat EOB with min assist and UE support on the bed for 5-7 min.  Then sitting in the STEDY enclosed on the bed and then up in the stedy holeing to front bar.    Special needs/care consideration BiPAP/CPAP No CPM No Continuous Drip IV 0.9% NL with KCL 20 mEq/L 80 mL/hr Dialysis No      Life Vest No Oxygen No Special Bed No Trach Size No Wound Vac (area) No      Skin Has a back dressing in place; has bruising on skin                             Bowel mgmt: Last BM 09/06/17 Bladder mgmt: Foley catheter in place Diabetic mgmt No    Previous Home Environment Living Arrangements: Other relatives Available Help at Discharge: Available  PRN/intermittently Type of Home: House Home Layout: One level Home Access: Ramped entrance Bathroom Shower/Tub: Tub/shower unit(pt unable to access BR) Bathroom Toilet: Handicapped height(pt unable to access BR) Bathroom Accessibility: No Home Care Services: Yes Type of Home Care Services: Biomedical scientist, [place] (if known): [facility] Additional Comments: Family reports that manual chair is falling apart  Discharge Living Setting Plans for Discharge Living Setting: House, Lives with (comment)(Lives with sister and brother-in-law) Type of Home at Discharge: House Discharge Home Layout: Two level, Laundry or work area in basement, Able to live on main level with bedroom/bathroom Alternate Level Stairs-Number of Steps: Flight Discharge Home Access: Ramped entrance Does the patient have any problems obtaining your medications?: No  Social/Family/Support Systems Patient Roles: Other (Comment)(Has a sister, brother-in-law, and mom.) Contact Information: Keeshia Sanderlin - mother - [phone] Anticipated Caregiver: sister and mom Anticipated Caregiver's Contact Information: Magdalene River - sister - [phone] Ability/Limitations of Caregiver: Sister works PT, brother-in-law works Medical laboratory scientific officer and Mom does not work and lives about 3 minutes away. Caregiver Availability: Intermittent Discharge Plan Discussed with Primary Caregiver: Yes Is Caregiver In Agreement with Plan?: Yes Does Caregiver/Family have Issues with Lodging/Transportation while Pt is in Rehab?: No  Goals/Additional Needs Patient/Family Goal for Rehab: PT mod I, OT mod I to supervision to min assist goals.  Likely will be W/C level goals Expected length of stay: 20-24 days Cultural Considerations: None Dietary Needs: Regular diet, thin liquids Equipment Needs: TBD Pt/Family Agrees to Admission and willing to participate: Yes Program Orientation Provided & Reviewed with Pt/Caregiver Including Roles  &  Responsibilities: Yes  Decrease burden of Care through IP rehab admission: N/A  Possible need for SNF placement upon discharge: Not anticipated  Patient Condition: This patient's condition remains as documented in the consult dated 09/07/17, in which the Rehabilitation Physician determined and documented that the patient's condition is appropriate for intensive rehabilitative care in an inpatient rehabilitation facility. Will admit to inpatient rehab today.  Preadmission Screen Completed By:  Retta Diones, 09/08/2017 10:52 AM ______________________________________________________________________  Discussed status with Dr. Naaman Plummer on 09/08/17 at 1052 and received telephone approval for admission today.  Admission Coordinator:  Retta Diones, time 1052/Date 09/08/17

## 2017-09-07 NOTE — Progress Notes (Signed)
Physical Therapy Treatment Patient Details Name: Sarah [name] MRN: [id number] DOB: [birth year] Today's Date: 09/07/2017    History of Present Illness 52 y.o. female s/p posterior spinal tumor resection at T 3 on 6/13. Pt with thyroid cancer with METs to spine. Pt experiencing increased LE weakness and experienced fall on 6/12 with bed>w/c transfer. PMH: Cancer, HTN, DJD, Paraplegia @ T4 (2015), PE    PT Comments    Working on transitions, sitting balance and methods of transferring.  Emphasis on decreasing pt's fears of sitting up without assist close by.   Follow Up Recommendations  CIR;Supervision/Assistance - 24 hour     Equipment Recommendations  Other (comment)(TBA)    Recommendations for Other Services Rehab consult     Precautions / Restrictions      Mobility  Bed Mobility Overal bed mobility: Needs Assistance Bed Mobility: Rolling;Sidelying to Sit Rolling: Max assist Sidelying to sit: Total assist;Max assist       General bed mobility comments: significant assist at LE's  due to heavy spasticity.  Pt assisted with UE's and rail.  Transfers Overall transfer level: Needs assistance   Transfers: Sit to/from Stand Sit to Stand: Total assist;+2 physical assistance         General transfer comment: pt fearful of sitting in the stedy for transfer.  Ambulation/Gait                 Stairs             Wheelchair Mobility    Modified Rankin (Stroke Patients Only)       Balance Overall balance assessment: Needs assistance Sitting-balance support: Feet supported;Bilateral upper extremity supported Sitting balance-Leahy Scale: Poor Sitting balance - Comments: reliant on UE support.  Sat EOB with min assist and UE support on the bed for 5-7 min.  Then sitting in the STEDY enclosed on the bed and then up in the stedy holeing to front bar.                                    Cognition Arousal/Alertness: Awake/alert Behavior During  Therapy: WFL for tasks assessed/performed Overall Cognitive Status: Within Functional Limits for tasks assessed                                        Exercises Other Exercises Other Exercises: PR/?AAROM to bil LE's with stretch Other Exercises: resisted bicep/tricep presses bil x10 reps, PNF D1/D2 resisted exercise bil x 10 reps.    General Comments        Pertinent Vitals/Pain Pain Assessment: No/denies pain    Home Living                      Prior Function            PT Goals (current goals can now be found in the care plan section) Acute Rehab PT Goals Patient Stated Goal: "to get back doing like I was"  PT Goal Formulation: With patient/family Time For Goal Achievement: 09/18/17 Potential to Achieve Goals: Good Progress towards PT goals: Progressing toward goals    Frequency    Min 4X/week      PT Plan Current plan remains appropriate    Co-evaluation              AM-PAC PT "6  Clicks" Daily Activity  Outcome Measure  Difficulty turning over in bed (including adjusting bedclothes, sheets and blankets)?: Unable Difficulty moving from lying on back to sitting on the side of the bed? : Unable Difficulty sitting down on and standing up from a chair with arms (e.g., wheelchair, bedside commode, etc,.)?: Unable Help needed moving to and from a bed to chair (including a wheelchair)?: Total Help needed walking in hospital room?: Total Help needed climbing 3-5 steps with a railing? : Total 6 Click Score: 6    End of Session Equipment Utilized During Treatment: Gait belt Activity Tolerance: Patient tolerated treatment well Patient left: in chair;with chair alarm set;with nursing/sitter in room;with call bell/phone within reach;with family/visitor present Nurse Communication: Mobility status;Need for lift equipment PT Visit Diagnosis: Other abnormalities of gait and mobility (R26.89);Unsteadiness on feet (R26.81);Other symptoms and  signs involving the nervous system (R29.898)     Time: 8372-9021 PT Time Calculation (min) (ACUTE ONLY): 43 min  Charges:  $Therapeutic Exercise: 8-22 mins $Therapeutic Activity: 23-37 mins                    G Codes:       2017/10/02  Donnella Sham, PT [phone]  (pager)   Sarah [name] 2017-10-02, 11:09 AM

## 2017-09-07 NOTE — Care Management Important Message (Signed)
Important Message  Patient Details  Name: Sarah [name] [place] MRN: [id number] Date of Birth: Nov 11, 1965   Medicare Important Message Given:       [name] 09/07/2017, 4:59 PM

## 2017-09-07 NOTE — Progress Notes (Signed)
[place] for Arixtra Indication: hx of  DVT and PE  Patient Measurements: Height: 5' 10.98" (180.3 cm) Weight: 232 lb 12.9 oz (105.6 kg) IBW/kg (Calculated) : 70.76  Vital Signs: Temp: 97.6 F (36.4 C) (06/17 0830) Temp Source: Oral (06/17 0830) BP: 145/86 (06/17 0830) Pulse Rate: 80 (06/17 0830)  Labs: No results for input(s): HGB, HCT, PLT, APTT, LABPROT, INR, HEPARINUNFRC, HEPRLOWMOCWT, CREATININE, CKTOTAL, CKMB, TROPONINI in the last 72 hours.  Estimated Creatinine Clearance: 90.8 mL/min (by C-G formula based on SCr of 0.98 mg/dL).  Medications:  Scheduled:  . baclofen  20 mg Oral TID  . celecoxib  200 mg Oral Q12H  . dexamethasone  4 mg Intravenous Q6H  . docusate sodium  100 mg Oral BID  . fluticasone  2 spray Each Nare QHS  . fondaparinux (ARIXTRA) injection  7.5 mg Subcutaneous Q0600  . levothyroxine  137 mcg Oral QAC breakfast  . lisinopril  40 mg Oral Daily  . pantoprazole  40 mg Oral Daily  . senna  2 tablet Oral Daily  . sertraline  25 mg Oral Daily  . sodium chloride flush  3 mL Intravenous Q12H   Infusions:  . sodium chloride    . sodium chloride    . 0.9 % NaCl with KCl 20 mEq / L 80 mL/hr at 09/05/17 1602  . cefTRIAXone (ROCEPHIN)  IV Stopped (09/06/17 2144)    Assessment: 52 yo continues on arixtra for history of VTE. She was on xarelto PTA. Dose now needs adjustment with weight up to >100 kg. Hg up to 8.5 - last on 6/14 and plt 190 - last on 6/12. No bleeding documented.   Plan:  Increase Arixtra to 10mg  SQ daily for weight >100kg F/u CBC, s/sx bleeding, weight changes, ability to restart xarelto  Elicia Lamp, PharmD, BCPS Clinical Pharmacist Clinical phone for 09/07/2017 until 3:30pm: [phone] If after 3:30pm, please call main pharmacy at: [phone] 09/07/2017 11:38 AM

## 2017-09-07 NOTE — Consult Note (Signed)
Physical Medicine and Rehabilitation Consult Reason for Consult: Decreased functional mobility Referring Physician: Dr. Christella Noa   HPI: Sarah [name] is a 52 y.o. 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  Per chart review patient lives with sister and brother-in-law.  One level home with ramped entrance.  Up until recently she was using a sliding board for transfers and ambulating short distances with a rolling walker up until May 2019.  She had been using a  wheelchair most of the time for the past 3 years.  Patient has had 2 previous back surgeries at T3 in 2015 and 2017.  Patient with recent admission approximately 2-1/2 weeks ago for nausea and vomiting was later discharged to home.  Presented 09/02/2017 with recent fall and increasing leg weakness over the past 2 weeks.  Patient initially was to see her oncologist 08/24/2017 for MRI that was planned however could not make the appointment due to lower extremity weakness.  She did report some urinary incontinence.  MRI and imaging revealed tumor recurrence at T3 with severe spinal cord compression.  Underwent posterior spinal tumor resection T3 09/03/2017 per Dr. Christella Noa.  Hospital course pain management.  Radiation oncology follow-up Dr. Dara Lords await possible plan for need for radiation therapy.  Decadron protocol as indicated.  Physical and occupational therapy evaluations completed with recommendations of physical medicine rehab consult.   Review of Systems  Constitutional: Negative for fever.  HENT: Negative for hearing loss.   Eyes: Negative for blurred vision and double vision.  Respiratory: Negative for cough and shortness of breath.   Cardiovascular: Positive for leg swelling. Negative for chest pain.  Gastrointestinal: Positive for constipation.  Genitourinary:       Urinary incontinence  Musculoskeletal:  Positive for joint pain and myalgias.  Skin: Negative for rash.  Neurological: Positive for sensory change and focal weakness.  All other systems reviewed and are negative.  Past Medical History:  Diagnosis Date  . Cancer [facility])    FNA of thyroid positive for onconytic/hurthle cell carcinoma  . Chronic back pain   . DDD (degenerative disc disease), cervical   . DJD (degenerative joint disease)   . History of rectal fissure   . HIT (heparin-induced thrombocytopenia) ([place])   . Hypertension   . Obesity   . Paraplegia at T4 level ([place])   . Pulmonary embolus, right ([place]) 2015  . Rotator cuff tendonitis right   Past Surgical History:  Procedure Laterality Date  . LAMINECTOMY N/A 12/14/2013   Procedure: THORACIC LAMINECTOMY FOR TUMOR THORACIC THREE;  Surgeon: Ashok Pall, MD;  Location: [place] NEURO ORS;  Service: Neurosurgery;  Laterality: N/A;  THORACIC LAMINECTOMY FOR TUMOR THORACIC THREE  . LAMINECTOMY N/A 07/05/2015   Procedure: THORACIC LAMINECTOMY FOR TUMOR;  Surgeon: Ashok Pall, MD;  Location: [place] NEURO ORS;  Service: Neurosurgery;  Laterality: N/A;  . LAMINECTOMY N/A 09/03/2017   Procedure: POSTERIOR SPINAL TUMOR RESECTION THORACIC THREE;  Surgeon: Ashok Pall, MD;  Location: [place];  Service: Neurosurgery;  Laterality: N/A;  POSTERIOR SPINAL TUMOR RESECTION THORACIC THREE  . POSTERIOR LUMBAR FUSION 4 LEVEL N/A 12/30/2013   Procedure: Thoracic one-Thoracic five posterior thoracic fusion with pedicle screws;  Surgeon: Ashok Pall, MD;  Location: [place] NEURO ORS;  Service: Neurosurgery;  Laterality: N/A;  Thoracic one-Thoracic five posterior thoracic fusion with pedicle screws  . THYROIDECTOMY N/A 01/09/2014   Procedure: TOTAL THYROIDECTOMY;  Surgeon:  Izora Gala, MD;  [facility];  Service: ENT;  Laterality: N/A;  . TONSILLECTOMY     Family History  Problem Relation Age of Onset  . Hypertension Mother   . Diabetes Mother   . Hypertension Father   . Stroke Father   . Diabetes  Father   . Diabetes Sister   . Diabetes Sister    Social History:  reports that she has never smoked. She has never used smokeless tobacco. She reports that she does not drink alcohol or use drugs. Allergies:  Allergies  Allergen Reactions  . Bee Venom Anaphylaxis  . Keflex [Cephalexin] Nausea And Vomiting  . Tramadol Nausea And Vomiting  . Gabapentin Nausea And Vomiting  . Heparin Other (See Comments)    Weak positive platelets induced antibodies, SRA negative--2015  . Hydrochlorothiazide Nausea And Vomiting  . Hydrocodone Nausea And Vomiting  . Oxycodone-Acetaminophen Nausea And Vomiting   Medications Prior to Admission  Medication Sig Dispense Refill  . baclofen (LIORESAL) 20 MG tablet TAKE (1) TABLET THREE TIMES DAILY. 90 tablet 1  . colchicine 0.6 MG tablet TAKE&nbsp;&nbsp;(1)&nbsp;&nbsp;TABLET TWICE A DAY AS NEEDED. 30 tablet 3  . fluticasone (FLONASE) 50 MCG/ACT nasal spray Place 2 sprays into both nostrils daily. 16 g 6  . furosemide (LASIX) 20 MG tablet Take 1 tablet (20 mg total) by mouth daily as needed. (Patient taking differently: Take 20 mg by mouth daily as needed for fluid. ) 30 tablet 1  . levothyroxine (SYNTHROID, LEVOTHROID) 137 MCG tablet Take 137 mcg by mouth daily before breakfast.    . lisinopril (PRINIVIL,ZESTRIL) 20 MG tablet Take 2 tablets (40 mg total) by mouth daily. 60 tablet 1  . NIFEdipine (PROCARDIA XL) 30 MG 24 hr tablet Take 1 tablet (30 mg total) by mouth daily. 30 tablet 1  . ondansetron (ZOFRAN) 4 MG tablet Take 1 tablet (4 mg total) by mouth every 6 (six) hours. 30 tablet 5  . pantoprazole (PROTONIX) 40 MG tablet Take 1 tablet (40 mg total) by mouth daily. 30 tablet 2  . rivaroxaban (XARELTO) 20 MG TABS tablet Take 1 tablet (20 mg total) by mouth daily with supper. Hold 3 days prior to cholecystectomy 30 tablet 3  . senna (SENOKOT) 8.6 MG TABS tablet Take 2 tablets (17.2 mg total) by mouth daily. 120 each 0  . sertraline (ZOLOFT) 25 MG tablet Take  1 tablet (25 mg total) by mouth daily. 30 tablet 3  . fondaparinux (ARIXTRA) 2.5 MG/0.5ML SOLN injection Inject 0.5 mLs (2.5 mg total) into the skin daily at 6 (six) AM. (Patient not taking: Reported on 09/02/2017) 2.5 mL 3    Home: Home Living Family/patient expects to be discharged to:: Inpatient rehab Living Arrangements: Other relatives Available Help at Discharge: Available PRN/intermittently Type of Home: House Home Access: Ramped entrance Home Layout: One level Bathroom Shower/Tub: Tub/shower unit(pt unable to access BR) Bathroom Toilet: Handicapped height(pt unable to access BR) Bathroom Accessibility: No Home Equipment: Environmental consultant - 2 wheels, Bedside commode, Shower seat, Wheelchair - power, Wheelchair - manual, Hospital bed, Other (comment)(sliding board) Additional Comments: Family reports that manual chair is falling apart  Functional History: Prior Function Level of Independence: Needs assistance Gait / Transfers Assistance Needed: Slide board transfers and ambulating short distances with RW prior to decline ADL's / Homemaking Assistance Needed: Pt performs bed/sink baths. Family performed cooking/cleaning tasks Comments: Pt uses manual chair most often for past 3 years. Pt was using RW for short distances with therapy only  until May  2019. Functional Status:  Mobility: Bed Mobility Overal bed mobility: Needs Assistance Bed Mobility: Supine to Sit Supine to sit: +2 for physical assistance, Max assist General bed mobility comments: pt up in chair  Transfers Overall transfer level: Needs assistance Equipment used: None Transfers: Squat Pivot Transfers Squat pivot transfers: +2 physical assistance, Max assist General transfer comment: unable to safely attempt with +1 assist       ADL: ADL Overall ADL's : Needs assistance/impaired Eating/Feeding: Independent Grooming: Wash/dry hands, Wash/dry face, Oral care, Brushing hair, Set up, Sitting(supported sitting) Upper Body  Bathing: Set up, Sitting(supported sitting ) Lower Body Bathing: Maximal assistance, Bed level, Sitting/lateral leans Upper Body Dressing : Minimal assistance, Sitting Lower Body Dressing: Total assistance, Sit to/from stand Toilet Transfer: Total assistance, BSC Toileting- Clothing Manipulation and Hygiene: Total assistance, Bed level Functional mobility during ADLs: Total assistance General ADL Comments: Pt T4 para.  Requires UE support to maintain unsupported sitting, and unable to access feet   Cognition: Cognition Overall Cognitive Status: Within Functional Limits for tasks assessed Orientation Level: Oriented X4 Cognition Arousal/Alertness: Awake/alert Behavior During Therapy: WFL for tasks assessed/performed Overall Cognitive Status: Within Functional Limits for tasks assessed  Blood pressure (!) 156/87, pulse 68, temperature 97.8 F (36.6 C), temperature source Oral, resp. rate 16, height 5' 10.98" (1.803 m), weight 99 kg (218 lb 4.1 oz), last menstrual period 10/22/2016, SpO2 100 %. Physical Exam  Constitutional: She is oriented to person, place, and time. She appears well-developed.  HENT:  Head: Normocephalic.  Eyes: Pupils are equal, round, and reactive to light.  Neck: Normal range of motion.  Cardiovascular: Normal rate.  Respiratory: Effort normal.  GI: Soft.  Musculoskeletal: She exhibits edema.  Neurological: She is alert and oriented to person, place, and time. No cranial nerve deficit.  UE 5/5. B/L LE 0/5 no resting tone today. Sensory tr to 1/2 bilateral LE's. Right heel cord a bit tight.  Skin:  Scattered abrasions/bruises on both legs    No results found for this or any previous visit (from the past 24 hour(s)). No results found.   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 Does the need for close, 24 hr/day medical supervision in concert with the patient's rehab needs make it  unreasonable for this patient to be served in a less intensive setting? Yes 2. Co-Morbidities requiring supervision/potential complications: pain mgt, wound care, neurogenic bowel and bladder 3. Due to bladder management, bowel management, safety, skin/wound care, disease management, medication administration, pain management and patient education, does the patient require 24 hr/day rehab nursing? Yes 4. Does the patient require coordinated care of a physician, rehab nurse, PT (1-2 hrs/day, 5 days/week) and OT (1-2 hrs/day, 5 days/week) to address physical and functional deficits in the context of the above medical diagnosis(es)? Yes Addressing deficits in the following areas: balance, endurance, locomotion, strength, transferring, bowel/bladder control, bathing, dressing, feeding, grooming, toileting and psychosocial support 5. Can the patient actively participate in an intensive therapy program of at least 3 hrs of therapy per day at least 5 days per week? Yes 6. The potential for patient to make measurable gains while on inpatient rehab is excellent 7. Anticipated functional outcomes upon discharge from inpatient rehab are modified independent  with PT, modified independent, supervision and min assist with OT, n/a with SLP. Likely wheel chair level goals.  8. Estimated rehab length of stay to reach the above functional goals is: 20-24 days 9. Anticipated D/C setting: Home  10. Anticipated post D/C treatments: HH therapy and Outpatient therapy 11. Overall Rehab/Functional Prognosis: good  RECOMMENDATIONS: This patient's condition is appropriate for continued rehabilitative care in the following setting: CIR Patient has agreed to participate in recommended program. Yes Note that insurance prior authorization may be required for reimbursement for recommended care.   Comment: Pt is very well known to me. She is extremely motivated and has proven very resilient over the years. Rehab Admissions  Coordinator to follow up.  Thanks,  [name], MD, Mellody Drown  I have personally performed a face to face diagnostic evaluation of this patient. Additionally, I have reviewed and concur with the physician assistant's documentation above.       Lavon Paganini Angiulli, PA-C 09/07/2017

## 2017-09-08 ENCOUNTER — Other Ambulatory Visit: Payer: Self-pay | Admitting: Pharmacist

## 2017-09-08 ENCOUNTER — Inpatient Hospital Stay (HOSPITAL_COMMUNITY)
Admission: RE | Admit: 2017-09-08 | Discharge: 2017-09-11 | DRG: 052 | Disposition: A | Discharge status: Other Acute Inpatient Hospital | Payer: Medicare Other | Source: Intra-hospital | Attending: Physical Medicine & Rehabilitation | Admitting: Physical Medicine & Rehabilitation

## 2017-09-08 ENCOUNTER — Encounter (HOSPITAL_COMMUNITY): Payer: Self-pay

## 2017-09-08 ENCOUNTER — Other Ambulatory Visit: Payer: Self-pay

## 2017-09-08 DIAGNOSIS — Z885 Allergy status to narcotic agent status: Secondary | ICD-10-CM | POA: Diagnosis not present

## 2017-09-08 DIAGNOSIS — Z888 Allergy status to other drugs, medicaments and biological substances status: Secondary | ICD-10-CM | POA: Diagnosis not present

## 2017-09-08 DIAGNOSIS — W010XXA Fall on same level from slipping, tripping and stumbling without subsequent striking against object, initial encounter: Secondary | ICD-10-CM | POA: Diagnosis present

## 2017-09-08 DIAGNOSIS — S1093XA Contusion of unspecified part of neck, initial encounter: Secondary | ICD-10-CM | POA: Diagnosis not present

## 2017-09-08 DIAGNOSIS — Z91030 Bee allergy status: Secondary | ICD-10-CM | POA: Diagnosis not present

## 2017-09-08 DIAGNOSIS — T148XXA Other injury of unspecified body region, initial encounter: Secondary | ICD-10-CM

## 2017-09-08 DIAGNOSIS — G8220 Paraplegia, unspecified: Secondary | ICD-10-CM | POA: Diagnosis present

## 2017-09-08 DIAGNOSIS — C73 Malignant neoplasm of thyroid gland: Secondary | ICD-10-CM | POA: Diagnosis not present

## 2017-09-08 DIAGNOSIS — Z981 Arthrodesis status: Secondary | ICD-10-CM | POA: Diagnosis not present

## 2017-09-08 DIAGNOSIS — S72332A Displaced oblique fracture of shaft of left femur, initial encounter for closed fracture: Secondary | ICD-10-CM | POA: Diagnosis not present

## 2017-09-08 DIAGNOSIS — K592 Neurogenic bowel, not elsewhere classified: Secondary | ICD-10-CM | POA: Diagnosis not present

## 2017-09-08 DIAGNOSIS — C7951 Secondary malignant neoplasm of bone: Secondary | ICD-10-CM | POA: Diagnosis not present

## 2017-09-08 DIAGNOSIS — Z86711 Personal history of pulmonary embolism: Secondary | ICD-10-CM | POA: Diagnosis not present

## 2017-09-08 DIAGNOSIS — N319 Neuromuscular dysfunction of bladder, unspecified: Secondary | ICD-10-CM | POA: Diagnosis not present

## 2017-09-08 DIAGNOSIS — I10 Essential (primary) hypertension: Secondary | ICD-10-CM | POA: Diagnosis not present

## 2017-09-08 DIAGNOSIS — B9620 Unspecified Escherichia coli [E. coli] as the cause of diseases classified elsewhere: Secondary | ICD-10-CM | POA: Diagnosis present

## 2017-09-08 DIAGNOSIS — Z7901 Long term (current) use of anticoagulants: Secondary | ICD-10-CM

## 2017-09-08 DIAGNOSIS — K219 Gastro-esophageal reflux disease without esophagitis: Secondary | ICD-10-CM | POA: Diagnosis not present

## 2017-09-08 DIAGNOSIS — G992 Myelopathy in diseases classified elsewhere: Secondary | ICD-10-CM | POA: Diagnosis present

## 2017-09-08 DIAGNOSIS — D62 Acute posthemorrhagic anemia: Secondary | ICD-10-CM | POA: Diagnosis not present

## 2017-09-08 DIAGNOSIS — Z419 Encounter for procedure for purposes other than remedying health state, unspecified: Secondary | ICD-10-CM

## 2017-09-08 DIAGNOSIS — E890 Postprocedural hypothyroidism: Secondary | ICD-10-CM | POA: Diagnosis not present

## 2017-09-08 DIAGNOSIS — Z6832 Body mass index (BMI) 32.0-32.9, adult: Secondary | ICD-10-CM

## 2017-09-08 DIAGNOSIS — Z881 Allergy status to other antibiotic agents status: Secondary | ICD-10-CM

## 2017-09-08 DIAGNOSIS — N390 Urinary tract infection, site not specified: Secondary | ICD-10-CM | POA: Diagnosis not present

## 2017-09-08 DIAGNOSIS — Z8249 Family history of ischemic heart disease and other diseases of the circulatory system: Secondary | ICD-10-CM | POA: Diagnosis not present

## 2017-09-08 DIAGNOSIS — S7292XA Unspecified fracture of left femur, initial encounter for closed fracture: Secondary | ICD-10-CM | POA: Diagnosis not present

## 2017-09-08 DIAGNOSIS — R52 Pain, unspecified: Secondary | ICD-10-CM

## 2017-09-08 DIAGNOSIS — K5900 Constipation, unspecified: Secondary | ICD-10-CM | POA: Diagnosis present

## 2017-09-08 DIAGNOSIS — Z86718 Personal history of other venous thrombosis and embolism: Secondary | ICD-10-CM | POA: Diagnosis not present

## 2017-09-08 DIAGNOSIS — E669 Obesity, unspecified: Secondary | ICD-10-CM | POA: Diagnosis present

## 2017-09-08 DIAGNOSIS — Z85850 Personal history of malignant neoplasm of thyroid: Secondary | ICD-10-CM | POA: Diagnosis not present

## 2017-09-08 DIAGNOSIS — Z79899 Other long term (current) drug therapy: Secondary | ICD-10-CM

## 2017-09-08 DIAGNOSIS — S7290XA Unspecified fracture of unspecified femur, initial encounter for closed fracture: Secondary | ICD-10-CM

## 2017-09-08 DIAGNOSIS — M7989 Other specified soft tissue disorders: Secondary | ICD-10-CM | POA: Diagnosis not present

## 2017-09-08 DIAGNOSIS — M25562 Pain in left knee: Secondary | ICD-10-CM | POA: Diagnosis not present

## 2017-09-08 DIAGNOSIS — D34 Benign neoplasm of thyroid gland: Secondary | ICD-10-CM | POA: Diagnosis not present

## 2017-09-08 DIAGNOSIS — Z79810 Long term (current) use of selective estrogen receptor modulators (SERMs): Secondary | ICD-10-CM

## 2017-09-08 MED ORDER — PANTOPRAZOLE SODIUM 40 MG PO TBEC
40.0000 mg | DELAYED_RELEASE_TABLET | Freq: Every day | ORAL | Status: DC
  Administered 2017-09-09 – 2017-09-10 (×2): 40 mg via ORAL
  Filled 2017-09-08 (×2): qty 1

## 2017-09-08 MED ORDER — ONDANSETRON HCL 4 MG PO TABS
4.0000 mg | ORAL_TABLET | Freq: Four times a day (QID) | ORAL | Status: DC | PRN
  Filled 2017-09-08: qty 1

## 2017-09-08 MED ORDER — ACETAMINOPHEN 650 MG RE SUPP: 650.0000 mg | RECTAL | Status: DC | PRN

## 2017-09-08 MED ORDER — SERTRALINE HCL 50 MG PO TABS: 25.0000 mg | ORAL_TABLET | Freq: Every day | ORAL | Status: DC

## 2017-09-08 MED ORDER — BACLOFEN 10 MG PO TABS: 10.0000 mg | ORAL_TABLET | Freq: Three times a day (TID) | ORAL | Status: DC

## 2017-09-08 MED ORDER — RIVAROXABAN 20 MG PO TABS: 20.0000 mg | ORAL_TABLET | Freq: Every day | ORAL | Status: DC

## 2017-09-08 MED ORDER — BACLOFEN 20 MG PO TABS
20.0000 mg | ORAL_TABLET | Freq: Three times a day (TID) | ORAL | Status: DC
  Administered 2017-09-08 – 2017-09-10 (×6): 20 mg via ORAL
  Filled 2017-09-08 (×6): qty 1

## 2017-09-08 MED ORDER — DEXAMETHASONE 4 MG PO TABS
4.0000 mg | ORAL_TABLET | Freq: Four times a day (QID) | ORAL | Status: DC
  Administered 2017-09-09 – 2017-09-10 (×8): 4 mg via ORAL
  Filled 2017-09-08 (×8): qty 1

## 2017-09-08 MED ORDER — LISINOPRIL 40 MG PO TABS: 40.0000 mg | ORAL_TABLET | Freq: Every day | ORAL | Status: DC

## 2017-09-08 MED ORDER — NIFEDIPINE ER OSMOTIC RELEASE 30 MG PO TB24
30.0000 mg | ORAL_TABLET | Freq: Every day | ORAL | Status: DC
  Filled 2017-09-08 (×2): qty 1

## 2017-09-08 MED ORDER — TRAZODONE HCL 50 MG PO TABS
50.0000 mg | ORAL_TABLET | Freq: Every evening | ORAL | Status: DC | PRN
  Filled 2017-09-08 (×2): qty 1

## 2017-09-08 MED ORDER — LEVOTHYROXINE SODIUM 137 MCG PO TABS: 137.0000 ug | ORAL_TABLET | Freq: Every day | ORAL | Status: DC

## 2017-09-08 MED ORDER — FUROSEMIDE 20 MG PO TABS: 20.0000 mg | ORAL_TABLET | Freq: Every day | ORAL | Status: DC | PRN

## 2017-09-08 MED ORDER — SENNA 8.6 MG PO TABS
2.0000 | ORAL_TABLET | Freq: Every day | ORAL | Status: DC
  Administered 2017-09-10: 17.2 mg via ORAL
  Filled 2017-09-08 (×2): qty 2

## 2017-09-08 MED ORDER — SULFAMETHOXAZOLE-TRIMETHOPRIM 800-160 MG PO TABS
1.0000 | ORAL_TABLET | Freq: Two times a day (BID) | ORAL | Status: DC
  Administered 2017-09-08 – 2017-09-10 (×5): 1 via ORAL
  Filled 2017-09-08 (×6): qty 1

## 2017-09-08 MED ORDER — DOCUSATE SODIUM 50 MG/5ML PO LIQD
100.0000 mg | Freq: Two times a day (BID) | ORAL | Status: DC
  Administered 2017-09-09 (×2): 100 mg via ORAL
  Filled 2017-09-08 (×3): qty 10

## 2017-09-08 MED ORDER — ONDANSETRON HCL 4 MG PO TABS
4.0000 mg | ORAL_TABLET | Freq: Four times a day (QID) | ORAL | Status: DC
  Administered 2017-09-09 – 2017-09-10 (×8): 4 mg via ORAL
  Filled 2017-09-08 (×8): qty 1

## 2017-09-08 MED ORDER — LEVOTHYROXINE SODIUM 137 MCG PO TABS
137.0000 ug | ORAL_TABLET | Freq: Every day | ORAL | Status: DC
  Administered 2017-09-09 – 2017-09-10 (×2): 137 ug via ORAL
  Filled 2017-09-08 (×3): qty 1

## 2017-09-08 MED ORDER — ONDANSETRON HCL 4 MG/2ML IJ SOLN
4.0000 mg | Freq: Four times a day (QID) | INTRAMUSCULAR | Status: DC | PRN
Start: 2017-09-08 — End: 2017-09-11

## 2017-09-08 MED ORDER — DOCUSATE SODIUM 100 MG PO CAPS
100.0000 mg | ORAL_CAPSULE | Freq: Two times a day (BID) | ORAL | Status: DC
  Filled 2017-09-08: qty 1

## 2017-09-08 MED ORDER — SORBITOL 70 % SOLN: 30.0000 mL | Freq: Every day | Status: DC | PRN

## 2017-09-08 MED ORDER — FONDAPARINUX SODIUM 2.5 MG/0.5ML ~~LOC~~ SOLN: 2.5000 mg | Freq: Every day | SUBCUTANEOUS | Status: DC

## 2017-09-08 MED ORDER — SENNA 8.6 MG PO TABS: 2.0000 | ORAL_TABLET | Freq: Every day | ORAL | Status: DC

## 2017-09-08 MED ORDER — SENNOSIDES-DOCUSATE SODIUM 8.6-50 MG PO TABS: 1.0000 | ORAL_TABLET | Freq: Every evening | ORAL | Status: DC | PRN

## 2017-09-08 MED ORDER — FLUTICASONE PROPIONATE 50 MCG/ACT NA SUSP: 2.0000 | Freq: Every day | NASAL | Status: DC

## 2017-09-08 MED ORDER — FLUTICASONE PROPIONATE 50 MCG/ACT NA SUSP
2.0000 | Freq: Every day | NASAL | Status: DC
  Administered 2017-09-09 – 2017-09-10 (×3): 2 via NASAL
  Filled 2017-09-08: qty 16

## 2017-09-08 MED ORDER — LISINOPRIL 40 MG PO TABS
40.0000 mg | ORAL_TABLET | Freq: Every day | ORAL | Status: DC
  Administered 2017-09-09 – 2017-09-10 (×2): 40 mg via ORAL
  Filled 2017-09-08 (×2): qty 1

## 2017-09-08 MED ORDER — DIAZEPAM 5 MG PO TABS: 5.0000 mg | ORAL_TABLET | Freq: Four times a day (QID) | ORAL | Status: DC | PRN

## 2017-09-08 MED ORDER — CELECOXIB 200 MG PO CAPS
200.0000 mg | ORAL_CAPSULE | Freq: Two times a day (BID) | ORAL | Status: DC
  Administered 2017-09-08: 200 mg via ORAL
  Filled 2017-09-08: qty 1

## 2017-09-08 MED ORDER — SERTRALINE HCL 50 MG PO TABS
25.0000 mg | ORAL_TABLET | Freq: Every day | ORAL | Status: DC
  Administered 2017-09-09 – 2017-09-10 (×2): 25 mg via ORAL
  Filled 2017-09-08 (×2): qty 1

## 2017-09-08 MED ORDER — HYDROCODONE-ACETAMINOPHEN 5-325 MG PO TABS
1.0000 | ORAL_TABLET | ORAL | Status: DC | PRN
  Administered 2017-09-08 – 2017-09-10 (×4): 1 via ORAL
  Filled 2017-09-08: qty 2
  Filled 2017-09-08 (×3): qty 1

## 2017-09-08 MED ORDER — PANTOPRAZOLE SODIUM 40 MG PO TBEC: 40.0000 mg | DELAYED_RELEASE_TABLET | Freq: Every day | ORAL | Status: DC

## 2017-09-08 MED ORDER — ACETAMINOPHEN 325 MG PO TABS
650.0000 mg | ORAL_TABLET | ORAL | Status: DC | PRN
  Administered 2017-09-09 (×2): 650 mg via ORAL
  Filled 2017-09-08 (×2): qty 2

## 2017-09-08 NOTE — Social Work (Signed)
CSW aware pt discharging to CIR today, CSW signing off. Please consult if any additional needs arise.  Cyndal Kasson H Elick Aguilera, LCSWA [place] Clinical Social Work [phone]   

## 2017-09-08 NOTE — Discharge Summary (Signed)
  Physician Discharge Summary  Patient ID: Sarah [name] MRN: [id number] DOB/AGE: [birth year] 52 y.o.  Admit date: 09/02/2017 Discharge date: 09/08/2017  Admission Diagnoses:metastatic hurthle cell tumor  Discharge Diagnoses:  Principal Problem:   Metastatic cancer to spine [facility]) Active Problems:   Hurthle cell carcinoma of thyroid ([place])   Paraplegia at T4 level [facility])   Thoracic spine tumor   Spinal cord compression due to malignant neoplasm metastatic to spine [facility])   History of pulmonary embolism   Obesity (BMI 30-39.9)   Status post surgery   Discharged Condition: fair  Hospital Course: [name] was admitted for lower extremity weakness. MRI revealed recurrent spinal cord compression at T3. I took her to the operating room for operative decompression. Post op her wound is clean, dry, and without signs of infection. She remains with 0/5 strength in the lower extremities  Treatments: surgery: Thoracic posterior approach for tumor resection  Discharge Exam: Blood pressure 114/79, pulse 67, temperature 97.9 F (36.6 C), temperature source Oral, resp. rate 15, height 5' 10.98" (1.803 m), weight 105.6 kg (232 lb 12.9 oz), last menstrual period 10/22/2016, SpO2 100 %. General appearance: appears stated age and no distress  Disposition:  THORACIC TUMOR     Signed: Javonni Macke L 09/08/2017, 11:39 AM

## 2017-09-08 NOTE — Progress Notes (Signed)
Physical Therapy Treatment Patient Details Name: Sarah [name] MRN: [id number] DOB: Sep 02, 1965 Today's Date: 09/08/2017    History of Present Illness 52 y.o. female s/p posterior spinal tumor resection at T 3 on 6/13. Pt with thyroid cancer with METs to spine. Pt experiencing increased LE weakness and experienced fall on 6/12 with bed>w/c transfer. PMH: Cancer, HTN, DJD, Paraplegia @ T4 (2015), PE    PT Comments    Pt participating hard at EOB on dynamic and static balance tasks.  Pt very fearful that the available guarding and assist will not be able to keep her from falling, which our treatment was directed toward.   Follow Up Recommendations  CIR;Supervision/Assistance - 24 hour     Equipment Recommendations  Other (comment)    Recommendations for Other Services       Precautions / Restrictions Precautions Precautions: Fall;Back    Mobility  Bed Mobility Overal bed mobility: Needs Assistance Bed Mobility: Rolling;Sidelying to Sit;Sit to Sidelying Rolling: Max assist Sidelying to sit: Total assist;Max assist     Sit to sidelying: Total assist General bed mobility comments: pt assisted with UE'S and rails to roll, significant assist to get LE's off the bed due to spasticity.  Transfers                    Ambulation/Gait                 Stairs             Wheelchair Mobility    Modified Rankin (Stroke Patients Only)       Balance Overall balance assessment: Needs assistance Sitting-balance support: Feet supported;Bilateral upper extremity supported;Single extremity supported Sitting balance-Leahy Scale: Poor Sitting balance - Comments: Worked at EOB >15 minutes on dynamic balance, reaching in a ~150* arc and general upright sitting attempt to move toward neutral pelvic tilt.                                    Cognition Arousal/Alertness: Awake/alert Behavior During Therapy: WFL for tasks assessed/performed Overall  Cognitive Status: Within Functional Limits for tasks assessed                                        Exercises      General Comments        Pertinent Vitals/Pain Pain Assessment: Faces Faces Pain Scale: Hurts a little bit Pain Location: Mid-Back Pain Intervention(s): Monitored during session    Home Living                      Prior Function            PT Goals (current goals can now be found in the care plan section) Acute Rehab PT Goals PT Goal Formulation: With patient/family Time For Goal Achievement: 09/18/17 Potential to Achieve Goals: Good Progress towards PT goals: Progressing toward goals    Frequency    Min 4X/week      PT Plan Current plan remains appropriate    Co-evaluation PT/OT/SLP Co-Evaluation/Treatment: Yes Reason for Co-Treatment: Complexity of the patient's impairments (multi-system involvement) PT goals addressed during session: Mobility/safety with mobility OT goals addressed during session: ADL's and self-care      AM-PAC PT "6 Clicks" Daily Activity  Outcome Measure  Difficulty turning over  in bed (including adjusting bedclothes, sheets and blankets)?: Unable Difficulty moving from lying on back to sitting on the side of the bed? : Unable Difficulty sitting down on and standing up from a chair with arms (e.g., wheelchair, bedside commode, etc,.)?: Unable Help needed moving to and from a bed to chair (including a wheelchair)?: Total Help needed walking in hospital room?: Total Help needed climbing 3-5 steps with a railing? : Total 6 Click Score: 6    End of Session   Activity Tolerance: Patient tolerated treatment well Patient left: in bed;with call bell/phone within reach;with SCD's reapplied Nurse Communication: Mobility status;Need for lift equipment PT Visit Diagnosis: Other abnormalities of gait and mobility (R26.89);Unsteadiness on feet (R26.81);Other symptoms and signs involving the nervous system  (R29.898)     Time: 7471-8550 PT Time Calculation (min) (ACUTE ONLY): 25 min  Charges:  $Therapeutic Activity: 8-22 mins                    G Codes:       2017-09-21  Donnella Sham, PT [phone]  (pager)   Tessie Fass Dekari Bures Sep 21, 2017, 10:16 PM

## 2017-09-08 NOTE — Progress Notes (Signed)
Rehab admissions - I have approval for acute inpatient rehab admission.  I spoke with Dr. Christella Noa and he has cleared patient medically for acute inpatient rehab admission for today.  Bed available and will admit to CIR today.  Call me for questions.  #[phone]

## 2017-09-08 NOTE — Progress Notes (Signed)
Dan from rehab spoke with Nurse about contacting Physician to change Arixtra to Xarelto before patient is discharged to CIR.  Nurse contacted Physician office and spoke with Casey Burkitt.

## 2017-09-08 NOTE — H&P (Signed)
Physical Medicine and Rehabilitation Admission H&P    Chief Complaint  Patient presents with  . Fall  . Back Pain  : HPI: Sarah [name]. 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  One level home with ramped entrance.  Up until recently she was using a sliding board for transfers and ambulating short distances with a rolling walker up until May 2019.  She had been using a wheelchair most the time for the past few years.  Patient with 2 previous back surgeries T3 in 2015 and 2017 and did receive inpatient rehab services.  Patient with recent admission approximately 2-1/2 weeks ago for nausea and vomiting was later discharged to home.  Presented 09/02/2017 with recent fall and increasing leg weakness over the past 2 weeks.  She was initially to see her oncologist 08/24/2017 for MRI that was planned however could not make the appointment due to lower extremity weakness.  She did also have some urinary incontinence.  MRI and imaging revealed tumor recurrence at T3 with severe spinal cord compression.  Underwent posterior spinal tumor resection T3 09/03/2017 per Dr. Christella Noa.  Pain control improving.   Radiation oncology follow-up Dr. Dara Lords with planning of radiation therapy.  Decadron protocol as indicated.  Presently maintained on Arixtra for history of DVT and pulmonary emboli.  Xarelto to be later resumed.  Acute on chronic anemia 10.2-10.8 and monitored.  E. coli urinary tract infection initially on Rocephin and plan to simplify antibiotics.  Physical and occupational therapy ongoing with recommendations of physical medicine rehab consult.  Patient was admitted for a comprehensive rehab program given her new neurological and functional deficits.  Review of Systems  Constitutional: Negative for chills and fever.  HENT:  Negative for hearing loss.   Eyes: Negative for blurred vision and double vision.  Respiratory: Negative for cough and shortness of breath.   Cardiovascular: Positive for leg swelling. Negative for chest pain and palpitations.  Gastrointestinal: Positive for constipation and nausea. Negative for abdominal pain.  Genitourinary:       Urinary incontinence  Musculoskeletal: Positive for joint pain and myalgias.  Neurological: Positive for sensory change and focal weakness.  Psychiatric/Behavioral: Positive for depression.  All other systems reviewed and are negative.  Past Medical History:  Diagnosis Date  . Cancer [facility])    FNA of thyroid positive for onconytic/hurthle cell carcinoma  . Chronic back pain   . DDD (degenerative disc disease), cervical   . DJD (degenerative joint disease)   . History of rectal fissure   . HIT (heparin-induced thrombocytopenia) ([place])   . Hypertension   . Obesity   . Paraplegia at T4 level ([place])   . Pulmonary embolus, right ([place]) 2015  . Rotator cuff tendonitis right   Past Surgical History:  Procedure Laterality Date  . LAMINECTOMY N/A 12/14/2013   Procedure: THORACIC LAMINECTOMY FOR TUMOR THORACIC THREE;  Surgeon: Ashok Pall, MD;  Location: [place] NEURO ORS;  Service: Neurosurgery;  Laterality: N/A;  THORACIC LAMINECTOMY FOR TUMOR THORACIC THREE  . LAMINECTOMY N/A 07/05/2015   Procedure: THORACIC LAMINECTOMY FOR TUMOR;  Surgeon: Ashok Pall, MD;  Location: [place] NEURO ORS;  Service: Neurosurgery;  Laterality: N/A;  . LAMINECTOMY N/A 09/03/2017   Procedure: POSTERIOR SPINAL TUMOR RESECTION THORACIC THREE;  Surgeon: Ashok Pall, MD;  Location: [place];  Service: Neurosurgery;  Laterality:  N/A;  POSTERIOR SPINAL TUMOR RESECTION THORACIC THREE  . POSTERIOR LUMBAR FUSION 4 LEVEL N/A 12/30/2013   Procedure: Thoracic one-Thoracic five posterior thoracic fusion with pedicle screws;  Surgeon: Ashok Pall, MD;  Location: [place] NEURO ORS;  Service: Neurosurgery;   Laterality: N/A;  Thoracic one-Thoracic five posterior thoracic fusion with pedicle screws  . THYROIDECTOMY N/A 01/09/2014   Procedure: TOTAL THYROIDECTOMY;  Surgeon: Izora Gala, MD;  Location: [place];  Service: ENT;  Laterality: N/A;  . TONSILLECTOMY     Family History  Problem Relation Age of Onset  . Hypertension Mother   . Diabetes Mother   . Hypertension Father   . Stroke Father   . Diabetes Father   . Diabetes Sister   . Diabetes Sister    Social History:  reports that she has never smoked. She has never used smokeless tobacco. She reports that she does not drink alcohol or use drugs. Allergies:  Allergies  Allergen Reactions  . Bee Venom Anaphylaxis  . Keflex [Cephalexin] Nausea And Vomiting  . Tramadol Nausea And Vomiting  . Gabapentin Nausea And Vomiting  . Heparin Other (See Comments)    Weak positive platelets induced antibodies, SRA negative--2015  . Hydrochlorothiazide Nausea And Vomiting  . Hydrocodone Nausea And Vomiting  . Oxycodone-Acetaminophen Nausea And Vomiting   Medications Prior to Admission  Medication Sig Dispense Refill  . baclofen (LIORESAL) 20 MG tablet TAKE (1) TABLET THREE TIMES DAILY. 90 tablet 1  . colchicine 0.6 MG tablet TAKE&nbsp;&nbsp;(1)&nbsp;&nbsp;TABLET TWICE A DAY AS NEEDED. 30 tablet 3  . fluticasone (FLONASE) 50 MCG/ACT nasal spray Place 2 sprays into both nostrils daily. 16 g 6  . furosemide (LASIX) 20 MG tablet Take 1 tablet (20 mg total) by mouth daily as needed. (Patient taking differently: Take 20 mg by mouth daily as needed for fluid. ) 30 tablet 1  . levothyroxine (SYNTHROID, LEVOTHROID) 137 MCG tablet Take 137 mcg by mouth daily before breakfast.    . lisinopril (PRINIVIL,ZESTRIL) 20 MG tablet Take 2 tablets (40 mg total) by mouth daily. 60 tablet 1  . NIFEdipine (PROCARDIA XL) 30 MG 24 hr tablet Take 1 tablet (30 mg total) by mouth daily. 30 tablet 1  . ondansetron (ZOFRAN) 4 MG tablet Take 1 tablet (4 mg total) by mouth every 6  (six) hours. 30 tablet 5  . pantoprazole (PROTONIX) 40 MG tablet Take 1 tablet (40 mg total) by mouth daily. 30 tablet 2  . rivaroxaban (XARELTO) 20 MG TABS tablet Take 1 tablet (20 mg total) by mouth daily with supper. Hold 3 days prior to cholecystectomy 30 tablet 3  . senna (SENOKOT) 8.6 MG TABS tablet Take 2 tablets (17.2 mg total) by mouth daily. 120 each 0  . sertraline (ZOLOFT) 25 MG tablet Take 1 tablet (25 mg total) by mouth daily. 30 tablet 3  . fondaparinux (ARIXTRA) 2.5 MG/0.5ML SOLN injection Inject 0.5 mLs (2.5 mg total) into the skin daily at 6 (six) AM. (Patient not taking: Reported on 09/02/2017) 2.5 mL 3    Drug Regimen Review Drug regimen was reviewed and remains appropriate with no significant issues identified  Home: Home Living Family/patient expects to be discharged to:: Inpatient rehab Living Arrangements: Other relatives Available Help at Discharge: Available PRN/intermittently Type of Home: House Home Access: Ramped entrance Home Layout: One level Bathroom Shower/Tub: Tub/shower unit(pt unable to access BR) Bathroom Toilet: Handicapped height(pt unable to access BR) Bathroom Accessibility: No Home Equipment: Environmental consultant - 2 wheels, Bedside commode, Shower seat, Wheelchair -  power, Wheelchair - manual, Hospital bed, Other (comment)(sliding board) Additional Comments: Family reports that manual chair is falling apart   Functional History: Prior Function Level of Independence: Needs assistance Gait / Transfers Assistance Needed: Slide board transfers and ambulating short distances with RW prior to decline ADL's / Homemaking Assistance Needed: Pt performs bed/sink baths. Family performed cooking/cleaning tasks Comments: Pt uses manual chair most often for past 3 years. Pt was using RW for short distances with therapy only  until May 2019.  Functional Status:  Mobility: Bed Mobility Overal bed mobility: Needs Assistance Bed Mobility: Rolling, Sidelying to  Sit Rolling: Max assist Sidelying to sit: Total assist, Max assist Supine to sit: +2 for physical assistance, Max assist General bed mobility comments: significant assist at LE's  due to heavy spasticity.  Pt assisted with UE's and rail. Transfers Overall transfer level: Needs assistance Equipment used: None Transfer via Lift Equipment: Stedy Transfers: Sit to/from Stand Sit to Stand: Total assist, +2 physical assistance Squat pivot transfers: +2 physical assistance, Max assist General transfer comment: pt fearful of sitting in the stedy for transfer.      ADL: ADL Overall ADL's : Needs assistance/impaired Eating/Feeding: Independent Grooming: Wash/dry hands, Wash/dry face, Oral care, Brushing hair, Set up, Sitting(supported sitting) Upper Body Bathing: Set up, Sitting(supported sitting ) Lower Body Bathing: Maximal assistance, Bed level, Sitting/lateral leans Upper Body Dressing : Minimal assistance, Sitting Lower Body Dressing: Total assistance, Sit to/from stand Toilet Transfer: Total assistance, BSC Toileting- Clothing Manipulation and Hygiene: Total assistance, Bed level Functional mobility during ADLs: Total assistance General ADL Comments: Pt T4 para.  Requires UE support to maintain unsupported sitting, and unable to access feet   Cognition: Cognition Overall Cognitive Status: Within Functional Limits for tasks assessed Orientation Level: Oriented X4 Cognition Arousal/Alertness: Awake/alert Behavior During Therapy: WFL for tasks assessed/performed Overall Cognitive Status: Within Functional Limits for tasks assessed  Physical Exam: Blood pressure 117/76, pulse 67, temperature 97.7 F (36.5 C), temperature source Oral, resp. rate 15, height 5' 10.98" (1.803 m), weight 105.6 kg (232 lb 12.9 oz), last menstrual period 10/22/2016, SpO2 100 %. Physical Exam  Constitutional: No distress.  obese  HENT:  Head: Normocephalic and atraumatic.  Eyes: Pupils are equal,  round, and reactive to light. EOM are normal. No scleral icterus.  Neck: Normal range of motion. No JVD present. No tracheal deviation present. No thyromegaly present.  Cardiovascular: Normal rate. Exam reveals no friction rub.  No murmur heard. Respiratory: Effort normal. No respiratory distress. She has no wheezes. She has no rales.  GI: Soft. Bowel sounds are normal. She exhibits no distension. There is no tenderness.  Musculoskeletal: She exhibits edema (left knee, LLE).  Left knee with mild crepitus. No appreciable pain with ROM  Neurological:  UE motor 5/5. LE 0/5 HF, KE, ADF/PF. ?1/2 sensation below waist. No resting tone  Skin: She is not diaphoretic.  Multiple abrasions and lacs around left knee/lower leg. Back incision dressed  Psychiatric: She has a normal mood and affect. Her behavior is normal. Judgment and thought content normal.          Medical Problem List and Plan: 1.  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  Status post recurrence of T3 spinal tumor resection 09/03/2017   -admit to inpatient rehab  -pt is very motivated (and has been in the past) 2.  DVT Prophylaxis/Anticoagulation: Xarelto resumed 3. Pain Management: Celebrex 200 mg every 12 hours, baclofen 20 mg 3 times daily,  hydrocodone as needed as well as Valium for spasms 4. Mood: Zoloft 25 mg daily.  Provide emotional support as needed.   -she has a good support network around her 5. Neuropsych: This patient is capable of making decisions on her own behalf. 6. Skin/Wound Care: Routine skin checks 7. Fluids/Electrolytes/Nutrition: Routine in and outs with follow-up chemistries  -po intake reasonable at present 8.  Acute on chronic anemia.  Follow-up CBC 9.  Hypertension.  Lisinopril 40 mg daily 10.  Hypothyroidism.  Synthroid 11.  Constipation.  Laxative assistance 12.  E. coli UTI.  Change Rocephin to Bactrim 13. Neurogenic bowel and bladder: voiding  trial, bowel program  Post Admission Physician Evaluation: 1. Functional deficits secondary  to thoracic myelopathy from metastatic Hurthle Cell Cancer. 2. [name] admitted to receive collaborative, interdisciplinary care between the physiatrist, rehab nursing staff, and therapy team. 3. Patient's level of medical complexity and substantial therapy needs in context of that medical necessity cannot be provided at a lesser intensity of care such as a SNF. 4. Patient has experienced substantial functional loss from his/her baseline which was documented above under the "Functional History" and "Functional Status" headings.  Judging by the patient's diagnosis, physical exam, and functional history, the patient has potential for functional progress which will result in measurable gains while on inpatient rehab.  These gains will be of substantial and practical use upon discharge  in facilitating mobility and self-care at the household level. 5. Physiatrist will provide 24 hour management of medical needs as well as oversight of the therapy plan/treatment and provide guidance as appropriate regarding the interaction of the two. 6. The Preadmission Screening has been reviewed and patient status is unchanged unless otherwise stated above. 7. 24 hour rehab nursing will assist with bladder management, bowel management, safety, skin/wound care, disease management, medication administration, pain management and patient education  and help integrate therapy concepts, techniques,education, etc. 8. PT will assess and treat for/with: Lower extremity strength, range of motion, stamina, balance, functional mobility, safety, adaptive techniques and equipment, NMR, pain control, spasticity, orthotics, w/c use.   Goals are: mod I to supervision at w/c level. 9. OT will assess and treat for/with: ADL's, functional mobility, safety, upper extremity strength, adaptive techniques and equipment, NMR, skin care, family education  , ego support.   Goals are: mod I, supervision, min assist at w/c level. Therapy may proceed with showering this patient. 10. SLP will assess and treat for/with: n/a.  Goals are: n/a. 11. Case Management and Social Worker will assess and treat for psychological issues and discharge planning. 12. Team conference will be held weekly to assess progress toward goals and to determine barriers to discharge. 13. Patient will receive at least 3 hours of therapy per day at least 5 days per week. 14. ELOS: 20-25 days       15. Prognosis:  excellent    I have personally performed a face to face diagnostic evaluation of this patient and formulated the key components of the plan.  Additionally, I have personally reviewed laboratory data, imaging studies, as well as relevant notes and concur with the physician assistant's documentation above.  [name], MD, FAAPMR   Lavon Paganini [place], PA-C 09/08/2017

## 2017-09-08 NOTE — Progress Notes (Signed)
Report given to Ed on 4W CIR [facility]oley will stay in for chronic use Iv will stay in in case rocephin is still being administered Waiting on discharge order Neurosurgery was called and secretary stated she will let Physician know about the need for discharge orders

## 2017-09-08 NOTE — Care Management Important Message (Signed)
Important Message  Patient Details  Name: Sarah [name] MRN: [id number] Date of Birth: February 16, 1966   Medicare Important Message Given:  Yes    Caetano Oberhaus P Geoffery Aultman 09/08/2017, 3:02 PM

## 2017-09-08 NOTE — Progress Notes (Signed)
Pt arrived on unit with RN and belongings; A&Ox4; oriented to room, fall prevention safety plan, rehab safety plan, rehab schedule, health resource notebook, nurse call system; verbalized understanding; pt resting in bed

## 2017-09-08 NOTE — H&P (Signed)
Physical Medicine and Rehabilitation Admission H&P       Chief Complaint  Patient presents with  . Fall  . 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  One level home with ramped entrance.  Up until recently she was using a sliding board for transfers and ambulating short distances with a rolling walker up until May 2019.  She had been using a wheelchair most the time for the past few years.  Patient with 2 previous back surgeries T3 in 2015 and 2017 and did receive inpatient rehab services.  Patient with recent admission approximately 2-1/2 weeks ago for nausea and vomiting was later discharged to home.  Presented 09/02/2017 with recent fall and increasing leg weakness over the past 2 weeks.  She was initially to see her oncologist 08/24/2017 for MRI that was planned however could not make the appointment due to lower extremity weakness.  She did also have some urinary incontinence.  MRI and imaging revealed tumor recurrence at T3 with severe spinal cord compression.  Underwent posterior spinal tumor resection T3 09/03/2017 per Dr. Christella Noa.  Pain control improving.   Radiation oncology follow-up Dr. Dara Lords with planning of radiation therapy.  Decadron protocol as indicated.  Presently maintained on Arixtra for history of DVT and pulmonary emboli.  Xarelto to be later resumed.  Acute on chronic anemia 10.2-10.8 and monitored.  E. coli urinary tract infection initially on Rocephin and plan to simplify antibiotics.  Physical and occupational therapy ongoing with recommendations of physical medicine rehab consult.  Patient was admitted for a comprehensive rehab program given her new neurological and functional deficits.  Review of Systems  Constitutional: Negative for chills and fever.   HENT: Negative for hearing loss.   Eyes: Negative for blurred vision and double vision.  Respiratory: Negative for cough and shortness of breath.   Cardiovascular: Positive for leg swelling. Negative for chest pain and palpitations.  Gastrointestinal: Positive for constipation and nausea. Negative for abdominal pain.  Genitourinary:       Urinary incontinence  Musculoskeletal: Positive for joint pain and myalgias.  Neurological: Positive for sensory change and focal weakness.  Psychiatric/Behavioral: Positive for depression.  All other systems reviewed and are negative.      Past Medical History:  Diagnosis Date  . Cancer [facility])    FNA of thyroid positive for onconytic/hurthle cell carcinoma  . Chronic back pain   . DDD (degenerative disc disease), cervical   . DJD (degenerative joint disease)   . History of rectal fissure   . HIT (heparin-induced thrombocytopenia) ([place])   . Hypertension   . Obesity   . Paraplegia at T4 level ([place])   . Pulmonary embolus, right ([place]) 2015  . Rotator cuff tendonitis right        Past Surgical History:  Procedure Laterality Date  . LAMINECTOMY N/A 12/14/2013   Procedure: THORACIC LAMINECTOMY FOR TUMOR THORACIC THREE;  Surgeon: Ashok Pall, MD;  Location: [place] [facility];  Service: Neurosurgery;  Laterality: N/A;  THORACIC LAMINECTOMY FOR TUMOR THORACIC THREE  . LAMINECTOMY N/A 07/05/2015   Procedure: THORACIC LAMINECTOMY FOR TUMOR;  Surgeon: Ashok Pall, MD;  Location: [place] [facility];  Service: Neurosurgery;  Laterality: N/A;  . LAMINECTOMY N/A 09/03/2017   Procedure: POSTERIOR SPINAL TUMOR RESECTION THORACIC THREE;  Surgeon:  Ashok Pall, MD;  Location: [facility];  Service: Neurosurgery;  Laterality: N/A;  POSTERIOR SPINAL TUMOR RESECTION THORACIC THREE  . POSTERIOR LUMBAR FUSION 4 LEVEL N/A 12/30/2013   Procedure: Thoracic one-Thoracic five posterior thoracic fusion with pedicle screws;  Surgeon: Ashok Pall, MD;  Location: [place] [facility];   Service: Neurosurgery;  Laterality: N/A;  Thoracic one-Thoracic five posterior thoracic fusion with pedicle screws  . THYROIDECTOMY N/A 01/09/2014   Procedure: TOTAL THYROIDECTOMY;  Surgeon: Izora Gala, MD;  Location: [facility];  Service: ENT;  Laterality: N/A;  . TONSILLECTOMY          Family History  Problem Relation Age of Onset  . Hypertension Mother   . Diabetes Mother   . Hypertension Father   . Stroke Father   . Diabetes Father   . Diabetes Sister   . Diabetes Sister    Social History:  reports that she has never smoked. She has never used smokeless tobacco. She reports that she does not drink alcohol or use drugs. Allergies:       Allergies  Allergen Reactions  . Bee Venom Anaphylaxis  . Keflex [Cephalexin] Nausea And Vomiting  . Tramadol Nausea And Vomiting  . Gabapentin Nausea And Vomiting  . Heparin Other (See Comments)    Weak positive platelets induced antibodies, SRA negative--2015  . Hydrochlorothiazide Nausea And Vomiting  . Hydrocodone Nausea And Vomiting  . Oxycodone-Acetaminophen Nausea And Vomiting         Medications Prior to Admission  Medication Sig Dispense Refill  . baclofen (LIORESAL) 20 MG tablet TAKE (1) TABLET THREE TIMES DAILY. 90 tablet 1  . colchicine 0.6 MG tablet TAKE&nbsp;&nbsp;(1)&nbsp;&nbsp;TABLET TWICE A DAY AS NEEDED. 30 tablet 3  . fluticasone (FLONASE) 50 MCG/ACT nasal spray Place 2 sprays into both nostrils daily. 16 g 6  . furosemide (LASIX) 20 MG tablet Take 1 tablet (20 mg total) by mouth daily as needed. (Patient taking differently: Take 20 mg by mouth daily as needed for fluid. ) 30 tablet 1  . levothyroxine (SYNTHROID, LEVOTHROID) 137 MCG tablet Take 137 mcg by mouth daily before breakfast.    . lisinopril (PRINIVIL,ZESTRIL) 20 MG tablet Take 2 tablets (40 mg total) by mouth daily. 60 tablet 1  . NIFEdipine (PROCARDIA XL) 30 MG 24 hr tablet Take 1 tablet (30 mg total) by mouth daily. 30 tablet 1  . ondansetron  (ZOFRAN) 4 MG tablet Take 1 tablet (4 mg total) by mouth every 6 (six) hours. 30 tablet 5  . pantoprazole (PROTONIX) 40 MG tablet Take 1 tablet (40 mg total) by mouth daily. 30 tablet 2  . rivaroxaban (XARELTO) 20 MG TABS tablet Take 1 tablet (20 mg total) by mouth daily with supper. Hold 3 days prior to cholecystectomy 30 tablet 3  . senna (SENOKOT) 8.6 MG TABS tablet Take 2 tablets (17.2 mg total) by mouth daily. 120 each 0  . sertraline (ZOLOFT) 25 MG tablet Take 1 tablet (25 mg total) by mouth daily. 30 tablet 3  . fondaparinux (ARIXTRA) 2.5 MG/0.5ML SOLN injection Inject 0.5 mLs (2.5 mg total) into the skin daily at 6 (six) AM. (Patient not taking: Reported on 09/02/2017) 2.5 mL 3    Drug Regimen Review Drug regimen was reviewed and remains appropriate with no significant issues identified  Home: Home Living Family/patient expects to be discharged to:: Inpatient rehab Living Arrangements: Other relatives Available Help at Discharge: Available PRN/intermittently Type of Home: House Home Access: Ramped entrance Home Layout: One level Bathroom Shower/Tub: Tub/shower  unit(pt unable to access BR) Bathroom Toilet: Handicapped height(pt unable to access BR) Bathroom Accessibility: No Home Equipment: Environmental consultant - 2 wheels, Bedside commode, Shower seat, Wheelchair - power, Wheelchair - manual, Hospital bed, Other (comment)(sliding board) Additional Comments: Family reports that manual chair is falling apart   Functional History: Prior Function Level of Independence: Needs assistance Gait / Transfers Assistance Needed: Slide board transfers and ambulating short distances with RW prior to decline ADL's / Homemaking Assistance Needed: Pt performs bed/sink baths. Family performed cooking/cleaning tasks Comments: Pt uses manual chair most often for past 3 years. Pt was using RW for short distances with therapy only  until May 2019.  Functional Status:  Mobility: Bed Mobility Overal bed  mobility: Needs Assistance Bed Mobility: Rolling, Sidelying to Sit Rolling: Max assist Sidelying to sit: Total assist, Max assist Supine to sit: +2 for physical assistance, Max assist General bed mobility comments: significant assist at LE's  due to heavy spasticity.  Pt assisted with UE's and rail. Transfers Overall transfer level: Needs assistance Equipment used: None Transfer via Lift Equipment: Stedy Transfers: Sit to/from Stand Sit to Stand: Total assist, +2 physical assistance Squat pivot transfers: +2 physical assistance, Max assist General transfer comment: pt fearful of sitting in the stedy for transfer.  ADL: ADL Overall ADL's : Needs assistance/impaired Eating/Feeding: Independent Grooming: Wash/dry hands, Wash/dry face, Oral care, Brushing hair, Set up, Sitting(supported sitting) Upper Body Bathing: Set up, Sitting(supported sitting ) Lower Body Bathing: Maximal assistance, Bed level, Sitting/lateral leans Upper Body Dressing : Minimal assistance, Sitting Lower Body Dressing: Total assistance, Sit to/from stand Toilet Transfer: Total assistance, BSC Toileting- Clothing Manipulation and Hygiene: Total assistance, Bed level Functional mobility during ADLs: Total assistance General ADL Comments: Pt T4 para.  Requires UE support to maintain unsupported sitting, and unable to access feet   Cognition: Cognition Overall Cognitive Status: Within Functional Limits for tasks assessed Orientation Level: Oriented X4 Cognition Arousal/Alertness: Awake/alert Behavior During Therapy: WFL for tasks assessed/performed Overall Cognitive Status: Within Functional Limits for tasks assessed  Physical Exam: Blood pressure 117/76, pulse 67, temperature 97.7 F (36.5 C), temperature source Oral, resp. rate 15, height 5' 10.98" (1.803 m), weight 105.6 kg (232 lb 12.9 oz), last menstrual period 10/22/2016, SpO2 100 %. Physical Exam  Constitutional: No distress.  obese  HENT:  Head:  Normocephalic and atraumatic.  Eyes: Pupils are equal, round, and reactive to light. EOM are normal. No scleral icterus.  Neck: Normal range of motion. No JVD present. No tracheal deviation present. No thyromegaly present.  Cardiovascular: Normal rate. Exam reveals no friction rub.  No murmur heard. Respiratory: Effort normal. No respiratory distress. She has no wheezes. She has no rales.  GI: Soft. Bowel sounds are normal. She exhibits no distension. There is no tenderness.  Musculoskeletal: She exhibits edema (left knee, LLE).  Left knee with mild crepitus. No appreciable pain with ROM  Neurological:  UE motor 5/5. LE 0/5 HF, KE, ADF/PF. ?1/2 sensation below waist. No resting tone  Skin: She is not diaphoretic.  Multiple abrasions and lacs around left knee/lower leg. Back incision dressed  Psychiatric: She has a normal mood and affect. Her behavior is normal. Judgment and thought content normal.          Medical Problem List and Plan: 1.  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  Status post recurrence of T3 spinal tumor resection 09/03/2017                    -  admit to inpatient rehab             -pt is very motivated (and has been in the past) 2.  DVT Prophylaxis/Anticoagulation: Xarelto resumed 3. Pain Management: Celebrex 200 mg every 12 hours, baclofen 20 mg 3 times daily, hydrocodone as needed as well as Valium for spasms 4. Mood: Zoloft 25 mg daily.  Provide emotional support as needed.              -she has a good support network around her 5. Neuropsych: This patient is capable of making decisions on her own behalf. 6. Skin/Wound Care: Routine skin checks 7. Fluids/Electrolytes/Nutrition: Routine in and outs with follow-up chemistries             -po intake reasonable at present 8.  Acute on chronic anemia.  Follow-up CBC 9.  Hypertension.  Lisinopril 40 mg daily 10.  Hypothyroidism.  Synthroid 11.  Constipation.   Laxative assistance 12.  E. coli UTI.  Change Rocephin to Bactrim 13. Neurogenic bowel and bladder: voiding trial, bowel program  Post Admission Physician Evaluation: 1. Functional deficits secondary  to thoracic myelopathy from metastatic Hurthle Cell Cancer. 2. [name] admitted to receive collaborative, interdisciplinary care between the physiatrist, rehab nursing staff, and therapy team. 3. Patient's level of medical complexity and substantial therapy needs in context of that medical necessity cannot be provided at a lesser intensity of care such as a SNF. 4. Patient has experienced substantial functional loss from his/her baseline which was documented above under the "Functional History" and "Functional Status" headings.  Judging by the patient's diagnosis, physical exam, and functional history, the patient has potential for functional progress which will result in measurable gains while on inpatient rehab.  These gains will be of substantial and practical use upon discharge  in facilitating mobility and self-care at the household level. 5. Physiatrist will provide 24 hour management of medical needs as well as oversight of the therapy plan/treatment and provide guidance as appropriate regarding the interaction of the two. 6. The Preadmission Screening has been reviewed and patient status is unchanged unless otherwise stated above. 7. 24 hour rehab nursing will assist with bladder management, bowel management, safety, skin/wound care, disease management, medication administration, pain management and patient education  and help integrate therapy concepts, techniques,education, etc. 8. PT will assess and treat for/with: Lower extremity strength, range of motion, stamina, balance, functional mobility, safety, adaptive techniques and equipment, NMR, pain control, spasticity, orthotics, w/c use.   Goals are: mod I to supervision at w/c level. 9. OT will assess and treat for/with: ADL's, functional  mobility, safety, upper extremity strength, adaptive techniques and equipment, NMR, skin care, family education , ego support.   Goals are: mod I, supervision, min assist at w/c level. Therapy may proceed with showering this patient. 10. SLP will assess and treat for/with: n/a.  Goals are: n/a. 11. Case Management and Social Worker will assess and treat for psychological issues and discharge planning. 12. Team conference will be held weekly to assess progress toward goals and to determine barriers to discharge. 13. Patient will receive at least 3 hours of therapy per day at least 5 days per week. 14. ELOS: 20-25 days       15. Prognosis:  excellent    I have personally performed a face to face diagnostic evaluation of this patient and formulated the key components of the plan.  Additionally, I have personally reviewed laboratory data, imaging studies, as well as relevant notes and  concur with the physician assistant's documentation above.  [name], MD, FAAPMR   Lavon Paganini [place], PA-C 09/08/2017

## 2017-09-09 ENCOUNTER — Inpatient Hospital Stay (HOSPITAL_COMMUNITY): Payer: Medicare Other

## 2017-09-09 ENCOUNTER — Inpatient Hospital Stay (HOSPITAL_COMMUNITY): Payer: Medicare Other | Admitting: Physical Therapy

## 2017-09-09 ENCOUNTER — Inpatient Hospital Stay (HOSPITAL_COMMUNITY): Payer: Medicare Other | Admitting: Occupational Therapy

## 2017-09-09 DIAGNOSIS — D62 Acute posthemorrhagic anemia: Secondary | ICD-10-CM

## 2017-09-09 LAB — HEMOGLOBIN AND HEMATOCRIT, BLOOD
HCT: 20.4 % — ABNORMAL LOW (ref 36.0–46.0)
Hemoglobin: 6.5 g/dL — CL (ref 12.0–15.0)

## 2017-09-09 LAB — COMPREHENSIVE METABOLIC PANEL
ALBUMIN: 2.1 g/dL — AB (ref 3.5–5.0)
ALK PHOS: 146 U/L — AB (ref 38–126)
ALT: 18 U/L (ref 14–54)
ANION GAP: 3 — AB (ref 5–15)
AST: 22 U/L (ref 15–41)
BILIRUBIN TOTAL: 0.6 mg/dL (ref 0.3–1.2)
BUN: 26 mg/dL — AB (ref 6–20)
CALCIUM: 7.2 mg/dL — AB (ref 8.9–10.3)
CO2: 26 mmol/L (ref 22–32)
CREATININE: 1 mg/dL (ref 0.44–1.00)
Chloride: 109 mmol/L (ref 101–111)
GFR calc Af Amer: >60 mL/min (ref >60)
GFR calc non Af Amer: >60 mL/min (ref >60)
GLUCOSE: 172 mg/dL — AB (ref 65–99)
Potassium: 4.3 mmol/L (ref 3.5–5.1)
SODIUM: 138 mmol/L (ref 135–145)
TOTAL PROTEIN: 3.8 g/dL — AB (ref 6.5–8.1)

## 2017-09-09 LAB — CBC WITH DIFFERENTIAL/PLATELET
Basophils Absolute: 0 10*3/uL (ref 0.0–0.1)
Basophils Relative: 0 %
EOS PCT: 0 %
Eosinophils Absolute: 0 10*3/uL (ref 0.0–0.7)
HEMATOCRIT: 19.6 % — AB (ref 36.0–46.0)
HEMOGLOBIN: 6.2 g/dL — AB (ref 12.0–15.0)
LYMPHS ABS: 1.3 10*3/uL (ref 0.7–4.0)
LYMPHS PCT: 12 %
MCH: 30.1 pg (ref 26.0–34.0)
MCHC: 31.6 g/dL (ref 30.0–36.0)
MCV: 95.1 fL (ref 78.0–100.0)
Monocytes Absolute: 0.6 10*3/uL (ref 0.1–1.0)
Monocytes Relative: 5 %
Neutro Abs: 9.1 10*3/uL — ABNORMAL HIGH (ref 1.7–7.7)
Neutrophils Relative %: 83 %
Platelets: 184 10*3/uL (ref 150–400)
RBC: 2.06 MIL/uL — AB (ref 3.87–5.11)
RDW: 16.8 % — ABNORMAL HIGH (ref 11.5–15.5)
WBC: 11 10*3/uL — AB (ref 4.0–10.5)

## 2017-09-09 LAB — PREPARE RBC (CROSSMATCH)

## 2017-09-09 LAB — OCCULT BLOOD X 1 CARD TO LAB, STOOL: FECAL OCCULT BLD: NEGATIVE

## 2017-09-09 MED ORDER — SODIUM CHLORIDE 0.9% IV SOLUTION
Freq: Once | INTRAVENOUS | Status: AC
  Administered 2017-09-09: 13:00:00 via INTRAVENOUS

## 2017-09-09 MED ORDER — ACETAMINOPHEN 325 MG PO TABS
650.0000 mg | ORAL_TABLET | Freq: Once | ORAL | Status: AC
  Administered 2017-09-09: 650 mg via ORAL
  Filled 2017-09-09: qty 2

## 2017-09-09 MED ORDER — FUROSEMIDE 10 MG/ML IJ SOLN
20.0000 mg | Freq: Once | INTRAMUSCULAR | Status: AC
  Administered 2017-09-09: 20 mg via INTRAVENOUS
  Filled 2017-09-09: qty 2

## 2017-09-09 MED ORDER — DIPHENHYDRAMINE HCL 12.5 MG/5ML PO ELIX
12.5000 mg | ORAL_SOLUTION | Freq: Once | ORAL | Status: AC
  Administered 2017-09-09: 12.5 mg via ORAL
  Filled 2017-09-09: qty 10

## 2017-09-09 NOTE — Care Management Note (Signed)
[facility] Individual Statement of Services  Patient Name:  Sarah [name]  Date:  09/09/2017  Welcome to the [facility].  Our goal is to provide you with an individualized program based on your diagnosis and situation, designed to meet your specific needs.  With this comprehensive rehabilitation program, you will be expected to participate in at least 3 hours of rehabilitation therapies Monday-Friday, with modified therapy programming on the weekends.  Your rehabilitation program will include the following services:  Physical Therapy (PT), Occupational Therapy (OT), 24 hour per day rehabilitation nursing, Neuropsychology, Case Management (Social Worker), Rehabilitation Medicine, Nutrition Services and Pharmacy Services  Weekly team conferences will be held on Tuesday to discuss your progress.  Your Social Worker will talk with you frequently to get your input and to update you on team discussions.  Team conferences with you and your family in attendance may also be held.  Expected length of stay: 4 weeks  Overall anticipated outcome: supervision wheelchair with verbal cues  Depending on your progress and recovery, your program may change. Your Social Worker will coordinate services and will keep you informed of any changes. Your Social Worker's name and contact numbers are listed  below.  The following services may also be recommended but are not provided by the [facility]:    [place] will be made to provide these services after discharge if needed.  Arrangements include referral to agencies that provide these services.  Your insurance has been verified to be:  [employer]-Medicare Your primary doctor is:  Radiographer, therapeutic  Pertinent information will be shared with your doctor and your insurance company.  Social Worker:  Ovidio Kin, [place] or  [phone]  Information discussed with and copy given to patient by: Elease Hashimoto, 09/09/2017, 4:23 PM

## 2017-09-09 NOTE — Progress Notes (Signed)
RN received call from blood bank that blood was ready. RN awaiting IV placement. IV nurse will return with ultrasound to attempt IV placement. Will start blood administration when IV access is obtained.

## 2017-09-09 NOTE — Discharge Instructions (Signed)
Inpatient Rehab Discharge Instructions  Sarah [name] Discharge date and time: No discharge date for patient encounter.   Activities/Precautions/ Functional Status: Activity: activity as tolerated Diet: regular diet Wound Care: keep wound clean and dry Functional status:  ___ No restrictions     ___ Walk up steps independently ___ 24/7 supervision/assistance   ___ Walk up steps with assistance ___ Intermittent supervision/assistance  ___ Bathe/dress independently ___ Walk with walker     _x__ Bathe/dress with assistance ___ Walk Independently    ___ Shower independently ___ Walk with assistance    ___ Shower with assistance ___ No alcohol     ___ Return to work/school ________  Special Instructions:    My questions have been answered and I understand these instructions. I will adhere to these goals and the provided educational materials after my discharge from the hospital.  Patient/Caregiver Signature _______________________________ Date __________  Clinician Signature _______________________________________ Date __________  Please bring this form and your medication list with you to all your follow-up doctor's appointments.

## 2017-09-09 NOTE — Progress Notes (Signed)
CRITICAL VALUE ALERT  Critical Value:  Hgb 6.2  Date & Time Notied:  09/09/17 1947  Provider Notified: MD Naaman Plummer  Orders Received/Actions taken: Stat repeat Hgb. Hold Blood thinners this am.

## 2017-09-09 NOTE — Progress Notes (Signed)
Occupational Therapy Progress Note (late entry)  Pt moved to EOB sitting with total A.  While sitting EOB worked on dynamic reaching off of BOS.  She requires min - occasional mod A for balance.  Continue to recommend CIR.     09/08/17 1600  OT Visit Information  Last OT Received On 09/08/17  Assistance Needed +2  PT/OT/SLP Co-Evaluation/Treatment Yes  Reason for Co-Treatment Complexity of the patient's impairments (multi-system involvement);For patient/therapist safety;To address functional/ADL transfers  PT goals addressed during session Mobility/safety with mobility  OT goals addressed during session ADL's and self-care  History of Present Illness 52 y.o. female s/p posterior spinal tumor resection at T 3 on 6/13. Pt with thyroid cancer with METs to spine. Pt experiencing increased LE weakness and experienced fall on 6/12 with bed>w/c transfer. PMH: Cancer, HTN, DJD, Paraplegia @ T4 (2015), PE  Precautions  Precautions Fall;Back  Precaution Booklet Issued No  Pain Assessment  Pain Assessment Faces  Faces Pain Scale 2  Pain Location Mid-Back  Pain Descriptors / Indicators Operative site guarding  Pain Intervention(s) Monitored during session  Cognition  Arousal/Alertness Awake/alert  Behavior During Therapy WFL for tasks assessed/performed  Overall Cognitive Status Within Functional Limits for tasks assessed  Bed Mobility  Overal bed mobility Needs Assistance  Bed Mobility Rolling;Sidelying to Sit;Sit to Sidelying  Rolling Max assist  Sidelying to sit Total assist;Max assist  Sit to sidelying Total assist  General bed mobility comments pt assisted with UE'S and rails to roll, significant assist to get LE's off the bed due to spasticity.  Balance  Overall balance assessment Needs assistance  Sitting-balance support Feet supported;Bilateral upper extremity supported;Single extremity supported  Sitting balance-Leahy Scale Poor  Sitting balance - Comments Worked at EOB >15  minutes on dynamic balance, reaching in a ~150* arc and general upright sitting attempt to move toward neutral pelvic tilt.  OT - End of Session  Activity Tolerance Patient tolerated treatment well  Patient left in bed;with call bell/phone within reach  Nurse Communication Mobility status  OT Assessment/Plan  OT Plan Discharge plan remains appropriate  OT Visit Diagnosis Muscle weakness (generalized) (M62.81);Pain  Pain - part of body  (back)  OT Frequency (ACUTE ONLY) Min 2X/week  Recommendations for Other Services Rehab consult  Follow Up Recommendations CIR;Supervision/Assistance - 24 hour  OT Equipment None recommended by OT  AM-PAC OT "6 Clicks" Daily Activity Outcome Measure  Help from another person eating meals? 4  Help from another person taking care of personal grooming? 3  Help from another person toileting, which includes using toliet, bedpan, or urinal? 1  Help from another person bathing (including washing, rinsing, drying)? 2  Help from another person to put on and taking off regular upper body clothing? 3  Help from another person to put on and taking off regular lower body clothing? 1  6 Click Score 14  ADL G Code Conversion CK  OT Goal Progression  Progress towards OT goals Progressing toward goals  OT Time Calculation  OT Start Time (ACUTE ONLY) 1549  OT Stop Time (ACUTE ONLY) 1614  OT Time Calculation (min) 25 min  OT General Charges  $OT Visit 1 Visit  OT Treatments  $Therapeutic Activity 8-22 mins  [facility], OTR/L [phone]

## 2017-09-09 NOTE — Progress Notes (Signed)
Follow-up repeat hemoglobin 6.5.  Will check CT lumbar thoracic spine rule out hematoma.  Xarelto currently is on hold.  Plan transfusion.

## 2017-09-09 NOTE — Progress Notes (Signed)
Retta Diones, RN  Rehab Admission Coordinator  Physical Medicine and Rehabilitation  PMR Pre-admission  Signed  Date of Service:  09/07/2017 2:48 PM       Related encounter: ED to Hosp-Admission (Discharged) from 09/02/2017 in [place]            Show:Clear all [x] Manual[x] Template[x] Copied  Added by: [x] Retta Diones, RN   [] Hover for details   PMR Admission Coordinator Pre-Admission Assessment  Patient: Sarah [name] is an 52 y.o., female MRN: [id number] DOB: 04/10/1965 Height: 5' 10.98" (180.3 cm) Weight: 105.6 kg (232 lb 12.9 oz)                                                                                                                                                  Insurance Information HMO: Yes   PPO:       PCP:       IPA:       80/20:       OTHER:  Group # [id number] PRIMARY: [employer] medicare      Policy#: [id number]      Subscriber: patient CM Name: Vevelyn Royals      Phone#: [phone]     Fax#: [phone] Pre-Cert#: [id number] for 7 days with update due on 09/14/17      Employer: Disabled Benefits:  Phone #: [phone]     Name:  On line portal Eff. 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 Per chart review patient lives with sister and brother-in-law. One level home with ramped entrance. Up until recently she was using a sliding board for transfers and ambulating short distances with a rolling walker up until May 2019. She had been using a wheelchair most of the time for the past 3 years. Patient has had 2 previous back surgeries at [facility] in 2015 and 2017. Patient with recent  admission approximately 2-1/2 weeks ago for nausea and vomiting was later discharged to home. Presented 09/02/2017 with recent fall and increasing leg weakness over the past 2 weeks. Patient initially was to see her oncologist 08/24/2017 for MRI that was planned however could not make the appointment due to lower extremity weakness. She did report some urinary incontinence. MRI and imaging revealed tumor recurrence at T3 with severe spinal cord compression. Underwent posterior spinal tumor resection [facility] 09/03/2017 per Dr. Christella Noa. Hospital course pain management. Radiation oncology follow-up Dr. Dara Lords await possible plan for need for radiation therapy. Decadron protocol as indicated. Physical and occupational therapy evaluations completed with recommendations of physical medicine rehab consult.   Past Medical History      Past Medical History:  Diagnosis Date  . Cancer [facility] [address])    FNA of thyroid positive for onconytic/hurthle cell carcinoma  . Chronic back pain   . DDD (degenerative disc disease), cervical   . DJD (degenerative joint disease)   . History of rectal fissure   . HIT (heparin-induced thrombocytopenia) ([place])   . Hypertension   . Obesity   . 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 Pulmonary embolus, right ([place]) 2015  . Rotator cuff tendonitis right    Family History  family history includes Diabetes in her father, mother, sister, and sister; Hypertension in her father and mother; Stroke in her  father.  Prior Rehab/Hospitalizations: Had outpatient rehab in 05/19 [facility]  Has the patient had major surgery during 100 days prior to admission? No  Current Medications   Current Facility-Administered Medications:  .  0.9 %  sodium chloride infusion, , Intravenous, Continuous, Cabbell, [name], MD .  0.9 %  sodium chloride infusion, 250 mL, Intravenous, Continuous, Cabbell, [name], MD .  0.9 % NaCl with KCl 20 mEq/ L  infusion, , Intravenous, Continuous, Ashok Pall, MD, Last Rate: 80 mL/hr at 09/05/17 1602 .  acetaminophen (TYLENOL) tablet 650 mg, 650 mg, Oral, Q4H PRN **OR** acetaminophen (TYLENOL) suppository 650 mg, 650 mg, Rectal, Q4H PRN, Ashok Pall, MD .  baclofen (LIORESAL) tablet 20 mg, 20 mg, Oral, TID, Ashok Pall, MD, 20 mg at 09/08/17 1007 .  cefTRIAXone (ROCEPHIN) 2 g in sodium chloride 0.9 % 100 mL IVPB, 2 g, Intravenous, QHS, Ashok Pall, MD, Stopped at 09/07/17 2225 .  celecoxib (CELEBREX) capsule 200 mg, 200 mg, Oral, Q12H, Ashok Pall, MD, 200 mg at 09/08/17 1007 .  dexamethasone (DECADRON) injection 4 mg, 4 mg, Intravenous, Q6H, Ashok Pall, MD, 4 mg at 09/08/17 5784 .  diazepam (VALIUM) tablet 5 mg, 5 mg, Oral, Q6H PRN, Ashok Pall, MD, 5 mg at 09/07/17 1159 .  docusate sodium (COLACE) capsule 100 mg, 100 mg, Oral, BID, Ashok Pall, MD, 100 mg at 09/06/17 0911 .  fluticasone (FLONASE) 50 MCG/ACT nasal spray 2 spray, 2 spray, Each Nare, QHS, Ashok Pall, MD, 2 spray at 09/07/17 0913 .  fondaparinux (ARIXTRA) injection 10 mg, 10 mg, Subcutaneous, Q0600, Romona Curls, RPH, 10 mg at 09/08/17 6962 .  HYDROcodone-acetaminophen (NORCO/VICODIN) 5-325 MG per tablet 1-2 tablet, 1-2 tablet, Oral, Q4H PRN, Ashok Pall, MD, 1 tablet at 09/08/17 9528 .  levothyroxine (SYNTHROID, LEVOTHROID) tablet 137 mcg, 137 mcg, Oral, QAC breakfast, Ashok Pall, MD, 137 mcg at 09/08/17 1008 .  lisinopril (PRINIVIL,ZESTRIL) tablet 40 mg, 40 mg, Oral, Daily,  Ashok Pall, MD, 40 mg at 09/08/17 1007 .  magnesium citrate solution 1 Bottle, 1 Bottle, Oral, Once PRN, Ashok Pall, MD .  menthol-cetylpyridinium (  CEPACOL) lozenge 3 mg, 1 lozenge, Oral, PRN **OR** phenol (CHLORASEPTIC) mouth spray 1 spray, 1 spray, Mouth/Throat, PRN, Ashok Pall, MD .  ondansetron (ZOFRAN) tablet 4 mg, 4 mg, Oral, Q6H PRN **OR** ondansetron (ZOFRAN) injection 4 mg, 4 mg, Intravenous, Q6H PRN, Ashok Pall, MD .  pantoprazole (PROTONIX) EC tablet 40 mg, 40 mg, Oral, Daily, Ashok Pall, MD, 40 mg at 09/08/17 1007 .  senna (SENOKOT) tablet 17.2 mg, 2 tablet, Oral, Daily, Cabbell, [name], MD, 17.2 mg at 09/08/17 1007 .  senna-docusate (Senokot-S) tablet 1 tablet, 1 tablet, Oral, QHS PRN, Ashok Pall, MD .  sertraline (ZOLOFT) tablet 25 mg, 25 mg, Oral, Daily, Ashok Pall, MD, 25 mg at 09/08/17 1007 .  sodium chloride flush (NS) 0.9 % injection 3 mL, 3 mL, Intravenous, Q12H, Ashok Pall, MD, 3 mL at 09/07/17 2155 .  sodium chloride flush (NS) 0.9 % injection 3 mL, 3 mL, Intravenous, PRN, Ashok Pall, MD .  traZODone (DESYREL) tablet 50 mg, 50 mg, Oral, QHS PRN, Ashok Pall, MD, 50 mg at 09/03/17 0101 .  zolpidem (AMBIEN) tablet 5 mg, 5 mg, Oral, QHS PRN, Ashok Pall, MD  Patients Current Diet:       Diet Order           Diet regular Room service appropriate? Yes; Fluid consistency: Thin  Diet effective now          Precautions / Restrictions Precautions Precautions: Fall, Back Precaution Booklet Issued: No Restrictions Weight Bearing Restrictions: No   Has the patient had 2 or more falls or a fall with injury in the past year?Yes.  Patient reports 1 fall with injury to her left knee  Prior Activity Level Limited Community (1-2x/wk): Went out 3 X a week, was not driving.  Home Assistive Devices / Equipment Home Assistive Devices/Equipment: Wheelchair, Environmental consultant (specify type)(front wheel) Home Equipment: Environmental consultant - 2 wheels, Bedside commode,  Shower seat, Wheelchair - power, Wheelchair - manual, Hospital bed, Other (comment)(sliding board)  Prior Device Use: Indicate devices/aids used by the patient prior to current illness, exacerbation or injury? Manual wheelchair and Walker  Prior Functional Level Prior Function Level of Independence: Needs assistance Gait / Transfers Assistance Needed: Slide board transfers and ambulating short distances with RW prior to decline ADL's / Homemaking Assistance Needed: Pt performs bed/sink baths. Family performed cooking/cleaning tasks Comments: Pt uses manual chair most often for past 3 years. Pt was using RW for short distances with therapy only  until May 2019.  Self Care: Did the patient need help bathing, dressing, using the toilet or eating?  Independent  Indoor Mobility: Did the patient need assistance with walking from room to room (with or without device)? Independent  Stairs: Did the patient need assistance with internal or external stairs (with or without device)? Needed some help  Functional Cognition: Did the patient need help planning regular tasks such as shopping or remembering to take medications? Independent  Current Functional Level Cognition  Overall Cognitive Status: Within Functional Limits for tasks assessed Orientation Level: Oriented X4    Extremity Assessment (includes Sensation/Coordination)  Upper Extremity Assessment: RUE deficits/detail RUE Deficits / Details: pain Rt shoulder with limited active shoulder ROM (~115) due to rotator cuff tendonitis   Lower Extremity Assessment: Defer to PT evaluation RLE Deficits / Details: Trace knee extension movement; Trace hip ER movement. No ankle DF, hip flexion, hip IR detected RLE Sensation: decreased light touch LLE Deficits / Details: Trace knee extension movement; Trace hip ER movement. No ankle DF,  hip flexion, hip IR detected. Increased tone noted in BLE LLE Sensation: decreased light touch(Some LT  sensation but diminished)    ADLs  Overall ADL's : Needs assistance/impaired Eating/Feeding: Independent Grooming: Wash/dry hands, Wash/dry face, Oral care, Brushing hair, Set up, Sitting(supported sitting) Upper Body Bathing: Set up, Sitting(supported sitting ) Lower Body Bathing: Maximal assistance, Bed level, Sitting/lateral leans Upper Body Dressing : Minimal assistance, Sitting Lower Body Dressing: Total assistance, Sit to/from stand Toilet Transfer: Total assistance, BSC Toileting- Clothing Manipulation and Hygiene: Total assistance, Bed level Functional mobility during ADLs: Total assistance General ADL Comments: Pt T4 para.  Requires UE support to maintain unsupported sitting, and unable to access feet     Mobility  Overal bed mobility: Needs Assistance Bed Mobility: Rolling, Sidelying to Sit Rolling: Max assist Sidelying to sit: Total assist, Max assist Supine to sit: +2 for physical assistance, Max assist General bed mobility comments: significant assist at LE's  due to heavy spasticity.  Pt assisted with UE's and rail.    Transfers  Overall transfer level: Needs assistance Equipment used: None Transfer via Lift Equipment: Stedy Transfers: Sit to/from Stand Sit to Stand: Total assist, +2 physical assistance Squat pivot transfers: +2 physical assistance, Max assist General transfer comment: pt fearful of sitting in the stedy for transfer.    Ambulation / Gait / Stairs / Office manager / Balance Dynamic Sitting Balance Sitting balance - Comments: reliant on UE support.  Sat EOB with min assist and UE support on the bed for 5-7 min.  Then sitting in the STEDY enclosed on the bed and then up in the stedy holeing to front bar. Balance Overall balance assessment: Needs assistance Sitting-balance support: Feet supported, Bilateral upper extremity supported Sitting balance-Leahy Scale: Poor Sitting balance - Comments: reliant on UE support.  Sat  EOB with min assist and UE support on the bed for 5-7 min.  Then sitting in the STEDY enclosed on the bed and then up in the stedy holeing to front bar.    Special needs/care consideration BiPAP/CPAP No CPM No Continuous Drip IV 0.9% NL with KCL 20 mEq/L 80 mL/hr Dialysis No      Life Vest No Oxygen No Special Bed No Trach Size No Wound Vac (area) No      Skin Has a back dressing in place; has bruising on skin                             Bowel mgmt: Last BM 09/06/17 Bladder mgmt: Foley catheter in place Diabetic mgmt No    Previous Home Environment Living Arrangements: Other relatives Available Help at Discharge: Available PRN/intermittently Type of Home: House Home Layout: One level Home Access: Ramped entrance Bathroom Shower/Tub: Tub/shower unit(pt unable to access BR) Bathroom Toilet: Handicapped height(pt unable to access BR) Bathroom Accessibility: No Home Care Services: Yes Type of Home Care Services: Biomedical scientist, [place] (if known): [facility] Additional Comments: Family reports that manual chair is falling apart  Discharge Living Setting Plans for Discharge Living Setting: House, Lives with (comment)(Lives with sister and brother-in-law) Type of Home at Discharge: House Discharge Home Layout: Two level, Laundry or work area in basement, Able to live on main level with bedroom/bathroom Alternate Level Stairs-Number of Steps: Flight Discharge Home Access: Ramped entrance Does the patient have any problems obtaining your medications?: No  Social/Family/Support Systems Patient Roles: Other (Comment)(Has a sister, brother-in-law,  and mom.) Contact Information: Franny Selvage - mother - [phone] Anticipated Caregiver: sister and mom Anticipated Caregiver's Contact Information: Magdalene River - sister - [phone] Ability/Limitations of Caregiver: Sister works PT, brother-in-law works Medical laboratory scientific officer and Mom does not work and lives about 3 minutes  away. Caregiver Availability: Intermittent Discharge Plan Discussed with Primary Caregiver: Yes Is Caregiver In Agreement with Plan?: Yes Does Caregiver/Family have Issues with Lodging/Transportation while Pt is in Rehab?: No  Goals/Additional Needs Patient/Family Goal for Rehab: PT mod I, OT mod I to supervision to min assist goals.  Likely will be W/C level goals Expected length of stay: 20-24 days Cultural Considerations: None Dietary Needs: Regular diet, thin liquids Equipment Needs: TBD Pt/Family Agrees to Admission and willing to participate: Yes Program Orientation Provided & Reviewed with Pt/Caregiver Including Roles  & Responsibilities: Yes  Decrease burden of Care through IP rehab admission: N/A  Possible need for SNF placement upon discharge: Not anticipated  Patient Condition: This patient's condition remains as documented in the consult dated 09/07/17, in which the Rehabilitation Physician determined and documented that the patient's condition is appropriate for intensive rehabilitative care in an inpatient rehabilitation facility. Will admit to inpatient rehab today.  Preadmission Screen Completed By:  Retta Diones, 09/08/2017 10:52 AM ______________________________________________________________________   Discussed status with Dr. Naaman Plummer on 09/08/17 at 1052 and received telephone approval for admission today.  Admission Coordinator:  Retta Diones, time 1052/Date 09/08/17             Cosigned by: [name], MD at 09/08/2017 11:12 AM  Revision History

## 2017-09-09 NOTE — Evaluation (Signed)
Physical Therapy Assessment and Plan  Patient Details  Name: BEZA STEPPE MRN: [id number] Date of Birth: July 04, 1965  PT Diagnosis: Abnormal posture, Abnormality of gait, Coordination disorder, Hypertonia, Impaired sensation, Muscle weakness and Paraplegia Rehab Potential: Good ELOS: 4 weeks    Today's Date: 09/09/2017 PT Individual Time: 0900-1000 PT Individual Time Calculation (min): 60 min    Problem List:  Patient Active Problem List   Diagnosis Date Noted  . Paraplegia ([place]) 09/08/2017  . Obesity (BMI 30-39.9) 09/03/2017  . Status post surgery 09/03/2017  . Calculus of gallbladder without cholecystitis without obstruction   . Biliary dyskinesia   . Intractable nausea and vomiting 08/09/2017  . Thrombocytopenia ([place]) 08/09/2017  . Elevated bilirubin 08/09/2017  . AKI (acute kidney injury) ([place]) 08/09/2017  . Nasal congestion 04/05/2017  . Breast pain 01/26/2017  . Upper respiratory infection, viral 01/26/2017  . Counseling regarding advanced care planning and goals of care 01/02/2017  . Bilateral rotator cuff syndrome 02/12/2016  . E. coli UTI 08/01/2015  . Constipation due to neurogenic bowel 08/01/2015  . Muscle spasticity   . 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 Spastic paraparesis   . Neurogenic bladder   . History of pulmonary embolism   . Post-operative pain   . Metastatic cancer ([place])   . Steroid-induced hyperglycemia   . Depression   . Obstipation   . Thoracic spine tumor 07/06/2015  . 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 Spinal cord compression due to malignant neoplasm metastatic to spine ([place])   . Postoperative hypothyroidism   . 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 Spasticity 09/19/2014  . Dysuria 09/04/2014  . Hypertension 05/01/2014  . Ingrown right big toenail 05/01/2014  . GERD (gastroesophageal reflux disease) 02/14/2014  . Dysphagia, pharyngoesophageal phase 02/06/2014  . Type 2 diabetes  mellitus without complication ([place]) 28/76/8115  . Constipation   . Neurogenic bowel   . Paraplegia at T4 level ([place]) 01/20/2014  . Metastatic cancer to spine ([place]) 01/19/2014  . Rotator cuff tendonitis   . 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 Leg weakness, bilateral 12/13/2013    Past Medical History:  Past Medical History:  Diagnosis Date  . Cancer [facility])    FNA of thyroid positive for onconytic/hurthle cell carcinoma  . Chronic back pain   . DDD (degenerative disc disease), cervical   . DJD (degenerative joint disease)   . History of rectal fissure   . HIT (heparin-induced thrombocytopenia) ([place])   . Hypertension   . Obesity   . Paraplegia at T4 level ([place])   . Pulmonary embolus, right ([place]) 2015  . Rotator cuff tendonitis right   Past Surgical History:  Past Surgical History:  Procedure Laterality Date  . LAMINECTOMY N/A 12/14/2013   Procedure: THORACIC LAMINECTOMY FOR TUMOR THORACIC THREE;  Surgeon: Ashok Pall, MD;  Location: [place] NEURO ORS;  Service: Neurosurgery;  Laterality: N/A;  THORACIC LAMINECTOMY FOR TUMOR THORACIC THREE  . LAMINECTOMY N/A 07/05/2015   Procedure: THORACIC LAMINECTOMY FOR TUMOR;  Surgeon: Ashok Pall, MD;  Location: [place] NEURO ORS;  Service: Neurosurgery;  Laterality: N/A;  . LAMINECTOMY N/A 09/03/2017   Procedure: POSTERIOR SPINAL TUMOR RESECTION THORACIC THREE;  Surgeon: Ashok Pall, MD;  Location: [place];  Service: Neurosurgery;  Laterality: N/A;  POSTERIOR SPINAL TUMOR RESECTION THORACIC THREE  . POSTERIOR LUMBAR FUSION 4 LEVEL N/A 12/30/2013   Procedure: Thoracic one-Thoracic five posterior thoracic fusion with pedicle  screws;  Surgeon: Ashok Pall, MD;  Location: [place] NEURO ORS;  Service: Neurosurgery;  Laterality: N/A;  Thoracic one-Thoracic five posterior thoracic fusion with pedicle screws  . THYROIDECTOMY N/A 01/09/2014   Procedure: TOTAL THYROIDECTOMY;  Surgeon: Izora Gala, MD;   Location: [place];  Service: ENT;  Laterality: N/A;  . TONSILLECTOMY      Assessment & Plan Clinical Impression: Bo Mcclintock. 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 One level home with ramped entrance. Up until recently she was using a sliding board for transfers and ambulating short distances with a rolling walker up until May 2019. She had been using a wheelchair most the time for the past few years. Patient with 2 previous back surgeries T3 in 2015 and 2017 and did receive inpatient rehab services. Patient with recent admission approximately 2-1/2 weeks ago for nausea and vomiting was later discharged to home. Presented 09/02/2017 with recent fall and increasing leg weakness over the past 2 weeks. She was initially to see her oncologist 08/24/2017 for MRI that was planned however could not make the appointment due to lower extremity weakness. She did also have some urinary incontinence. MRI and imaging revealed tumor recurrence at T3 with severe spinal cord compression. Underwent posterior spinal tumor resection T3 09/03/2017 per Dr. Christella Noa. Pain control improving.Radiation oncology follow-up Dr. Dara Lords with planning of radiation therapy. Decadron protocol as indicated. Presently maintained on Arixtra for history of DVT and pulmonary emboli.Xareltoto belater resumed. Acute on chronic anemia 10.2-10.8 and monitored.E. coli urinary tract infection initially on Rocephin and plan to simplify antibiotics. Physical and occupational therapy ongoing with recommendations of physical medicine rehab consult. Patient was admitted for a comprehensive rehab program given her new neurological and functional deficits.Information taken from medical chart.  Patient transferred to CIR on 09/08/2017 .   Patient  currently requires total with mobility secondary to muscle weakness and muscle paralysis, impaired timing and sequencing, abnormal tone, unbalanced muscle activation and decreased coordination and decreased sitting balance, decreased standing balance, decreased postural control and decreased balance strategies.  Prior to hospitalization, patient was modified independent  with mobility and lived with Family in a House home.  Home access is  Ramped entrance.  Patient will benefit from skilled PT intervention to maximize safe functional mobility, minimize fall risk and decrease caregiver burden for planned discharge home with 24 hour assist.  Anticipate patient will benefit from follow up [facility] at discharge.  PT - End of Session Activity Tolerance: Tolerates 30+ min activity with multiple rests Endurance Deficit: Yes PT Assessment Rehab Potential (ACUTE/IP ONLY): Good PT Patient demonstrates impairments in the following area(s): Balance;Sensory;Endurance;Motor;Safety PT Transfers Functional Problem(s): Bed Mobility;Bed to Chair;Car;Furniture;Floor PT Locomotion Functional Problem(s): Wheelchair Mobility;Ambulation PT Plan PT Intensity: Minimum of 1-2 x/day ,45 to 90 minutes PT Duration Estimated Length of Stay: 4 weeks  PT Treatment/Interventions: Ambulation/gait training;Discharge planning;Functional mobility training;Psychosocial support;Therapeutic Activities;Balance/vestibular training;Disease management/prevention;Neuromuscular re-education;Therapeutic Exercise;Wheelchair propulsion/positioning;DME/adaptive equipment instruction;UE/LE Strength taining/ROM;Community reintegration;Functional electrical stimulation;Patient/family education;UE/LE Coordination activities PT Transfers Anticipated Outcome(s): min A with slide board transfer  PT Locomotion Anticipated Outcome(s): mod I with w/c propulsion in community and home environment  PT Recommendation Follow Up Recommendations: Home health  PT Patient destination: Home Equipment Recommended: Wheelchair (measurements);Wheelchair cushion (measurements)  Skilled Therapeutic Intervention Pt reported no pain prior to the start of her initial PT assessment this morning. Pt reported nursing assessed  hemoglobin levels to be low this morning but Pt indicates she is not symptomatic. PT assessment focused on bed mobility, LE assessment, and EOB sitting balance. Pt requires plus 2 for all bed mobility and weight shifting requiring management assist for cath and leg placement. Pt positioned EOB with PT providing min A posteriorly for trunk support and prevention of LOB episode. Pt reported she was fearful sitting EOB 2/2 to previous fall experienced within the past year. Pt sat EOB eating her breakfast steadying her L UE on tray table and PT providing min A for support posteriorly. Pt demonstrates inability to move her legs and demo'd absent sensation to LT and diminished sensation to pressure bilaterally. PT initiated EOB sitting balance with R UE for support and both legs supported on ground. Pt held position for 1 min and PT further progressed Pt to no UE support. Pt able to hold sitting balance position for 1 min 15 seconds before requiring PT assist due to LOB posteriorly. PT initiated dynamic sitting balance with alternating functional reaching to placed targets. Pt able to perform for <2 min with PT providing verbal cues to self correct trunk leans with abdominal activation to prevent lateral/ posterior LOB occurrence. For all sitting balance activities, Pt required increased time to attain sitting balance posture to begin activity with close SPV during task. PT ended session with Pt supine in bed with call bell and tray table in reach and all needs met.   PT Evaluation Precautions/Restrictions Precautions Precautions: Fall Restrictions Weight Bearing Restrictions: No General Pain Pain Assessment Pain Scale: 0-10 Pain Score: 0-No pain Home  Living/Prior Functioning Home Living Available Help at Discharge: Available PRN/intermittently Type of Home: House Home Access: Ramped entrance Home Layout: One level Bathroom Shower/Tub: Chiropodist: Handicapped height Bathroom Accessibility: No Additional Comments: Pt reports using bedside commode  Lives With: Family Prior Function Level of Independence: Independent with transfers;Requires assistive device for independence(Pt primarily utilized w/c for amb.)  Able to Take Stairs?: No Driving: No Comments: Pt uses manual chair most often for past 3 years. Pt was using RW for short distances with therapy only  until May 2019. Pt reports staying home most days with her nephew Vision/Perception  Perception Perception: Not tested Praxis Praxis: Not tested  Cognition Overall Cognitive Status: Within Functional Limits for tasks assessed Orientation Level: Oriented X4 Attention: Focused;Sustained Focused Attention: Appears intact Sustained Attention: Appears intact Memory: Appears intact Awareness: Impaired Awareness Impairment: Anticipatory impairment Problem Solving: Impaired Problem Solving Impairment: Verbal complex;Functional complex Safety/Judgment: Impaired Sensation Sensation Light Touch: Impaired Detail(Pt unable to feel LT to B LE's; Pt able to feel pressure applied to L LE> R LE) Light Touch Impaired Details: Absent LLE;Absent RLE Coordination Gross Motor Movements are Fluid and Coordinated: Not tested(Not assessed 2/2 to inability to move LE's) Motor  Motor Motor: Paraplegia Motor - Skilled Clinical Observations: Pt demonstrates inability to move LE's within any range  Mobility Bed Mobility Bed Mobility: Rolling Right;Rolling Left;Right Sidelying to Sit;Supine to Sit;Sitting - Scoot to [employer] of Bed;Sit to Supine Rolling Right: 2 Helpers(2 helpers assisting ) Rolling Right Details (indicate cue type and reason): Pt able to perform trunk rotation  using B UE's on bed rails to assist with rolling movement. Pt required total A for leg placement and pelvis rotation to complete task Rolling Left: 2 Helpers Right Sidelying to Sit: 2 Helpers Supine to Sit: 2 Helpers Sitting - Scoot to [employer] of Bed: 2 Helpers Sit to Supine: 2 Helpers Sit to Supine -  Details (indicate cue type and reason): Pt required total A for LE management and placement of pelvis in neutral for adequate posturing. Pt able to manage UE's. Locomotion  Gait Ambulation: No Gait Gait: No Stairs / Additional Locomotion Stairs: No Wheelchair Mobility Wheelchair Mobility: No  Trunk/Postural Assessment  Cervical Assessment Cervical Assessment: Within Functional Limits Thoracic Assessment Thoracic Assessment: Within Functional Limits Lumbar Assessment Lumbar Assessment: Within Functional Limits Postural Control Postural Control: Deficits on evaluation(Pt demo's delayed protective responses during dynamic sitting balance EOB requiring min A from PT for stabilizing )  Balance Balance Balance Assessed: Yes Static Sitting Balance Static Sitting - Balance Support: Feet supported;No upper extremity supported Static Sitting - Level of Assistance: 4: Min assist Static Sitting - Comment/# of Minutes: Pt required increased time and physical assist to attain sitting balance EOB. Pt then able to sit EOB with feet supported and hand placement on B knees for 1 minute indicating she could hold it longer. PT ended timer due to ceiling effect.  Dynamic Sitting Balance Dynamic Sitting - Balance Support: Feet supported;No upper extremity supported Dynamic Sitting - Level of Assistance: 4: Min assist Reach ([name] able to reach ___ inches to right, left, forward, back): Pt required increased time and physical assist to attain sitting balance EOB.Pt able to perform functional reaching in all planes with alternating B UE's for 1 min 15 secs reaching for targets before requiring LOB  posteriorly requiring min A from PT for steadying Dynamic Sitting - Balance Activities: Lateral lean/weight shifting;Forward lean/weight shifting;Reaching for objects;Reaching across midline Extremity Assessment      RLE Assessment RLE Assessment: Exceptions to [facility] General Strength Comments: Pt unable to demonstrate movement in R LE. PT notes increased spasticity with PROM into knee extension.  LLE Assessment LLE Assessment: Exceptions to [facility] General Strength Comments: Pt unable to demonstrate movement in L LE   See Function Navigator for Current Functional Status.   Refer to Care Plan for Long Term Goals  Recommendations for other services: Neuropsych  Discharge Criteria: Patient will be discharged from PT if patient refuses treatment 3 consecutive times without medical reason, if treatment goals not met, if there is a change in medical status, if patient makes no progress towards goals or if patient is discharged from hospital.  The above assessment, treatment plan, treatment alternatives and goals were discussed and mutually agreed upon: by patient  Floreen Comber 09/09/2017, 12:28 PM

## 2017-09-09 NOTE — Progress Notes (Signed)
Occupational Therapy Note  Patient Details  Name: Sarah [name] MRN: [id number] Date of Birth: 1966/02/16  Today's Date: 09/09/2017 OT Missed Time: 75 Minutes Missed Time Reason: CT/MRI;Other (comment)  Pt down for CT and then with IV team trying to place IV and then getting blood transfusion due to low Hgb.  Missed OT eval and 75 min of tx. . Will defer unto tomorrow.    Willeen Cass [facility] 09/09/2017, 2:11 PM

## 2017-09-09 NOTE — Progress Notes (Signed)
Physical Therapy Note  Patient Details  Name: TANICKA BISAILLON MRN: [id number] Date of Birth: 1966-02-12 Today's Date: 09/09/2017   Pt with return Hgb of 6.5, receiving 2 units of PRBCs and further workup to rule out bleed.  Per PA hold PM PT session and recheck tomorrow.  Missed 60 minutes of skilled PT.    Michel Santee 09/09/2017, 3:38 PM

## 2017-09-09 NOTE — Progress Notes (Signed)
Sarah Staggers, Sarah [name]  Physician  Physical Medicine and Rehabilitation  Consult Note  Signed  Date of Service:  09/07/2017 6:14 AM       Related encounter: ED to Hosp-Admission (Discharged) from 09/02/2017 in [place] All Collapse All            Physical Medicine and Rehabilitation Consult Reason for Consult: Decreased functional mobility Referring Physician: Dr. Christella Noa   HPI: Sarah [name] is a 52 y.o. 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  Per chart review patient lives with sister and brother-in-law.  One level home with ramped entrance.  Up until recently she was using a sliding board for transfers and ambulating short distances with a rolling walker up until May 2019.  She had been using a  wheelchair most of the time for the past 3 years.  Patient has had 2 previous back surgeries at T3 in 2015 and 2017.  Patient with recent admission approximately 2-1/2 weeks ago for nausea and vomiting was later discharged to home.  Presented 09/02/2017 with recent fall and increasing leg weakness over the past 2 weeks.  Patient initially was to see her oncologist 08/24/2017 for MRI that was planned however could not make the appointment due to lower extremity weakness.  She did report some urinary incontinence.  MRI and imaging revealed tumor recurrence at T3 with severe spinal cord compression.  Underwent posterior spinal tumor resection T3 09/03/2017 per Dr. Christella Noa.  Hospital course pain management.  Radiation oncology follow-up Dr. Dara Lords await possible plan for need for radiation therapy.  Decadron protocol as indicated.  Physical and occupational therapy evaluations completed with recommendations of physical medicine rehab consult.   Review of Systems  Constitutional: Negative for fever.  HENT: Negative  for hearing loss.   Eyes: Negative for blurred vision and double vision.  Respiratory: Negative for cough and shortness of breath.   Cardiovascular: Positive for leg swelling. Negative for chest pain.  Gastrointestinal: Positive for constipation.  Genitourinary:       Urinary incontinence  Musculoskeletal: Positive for joint pain and myalgias.  Skin: Negative for rash.  Neurological: Positive for sensory change and focal weakness.  All other systems reviewed and are negative.      Past Medical History:  Diagnosis Date  . Cancer [facility])    FNA of thyroid positive for onconytic/hurthle cell carcinoma  . Chronic back pain   . DDD (degenerative disc disease), cervical   . DJD (degenerative joint disease)   . History of rectal fissure   . HIT (heparin-induced thrombocytopenia) ([place])   . Hypertension   . Obesity   . Paraplegia at T4 level ([place])   . Pulmonary embolus, right ([place]) 2015  . Rotator cuff tendonitis right        Past Surgical History:  Procedure Laterality Date  . LAMINECTOMY N/A 12/14/2013   Procedure: THORACIC LAMINECTOMY FOR TUMOR THORACIC THREE;  Surgeon: Ashok Pall, Sarah [name];  Location: [place] NEURO ORS;  Service: Neurosurgery;  Laterality: N/A;  THORACIC LAMINECTOMY FOR TUMOR THORACIC THREE  . LAMINECTOMY N/A 07/05/2015   Procedure: THORACIC LAMINECTOMY FOR TUMOR;  Surgeon: Ashok Pall, Sarah [name];  Location: [place] NEURO ORS;  Service: Neurosurgery;  Laterality: N/A;  . LAMINECTOMY N/A 09/03/2017   Procedure: POSTERIOR SPINAL TUMOR RESECTION THORACIC THREE;  Surgeon: Ashok Pall, Sarah [name];  Location: [place] OR;  Service: Neurosurgery;  Laterality: N/A;  POSTERIOR SPINAL TUMOR RESECTION THORACIC THREE  . POSTERIOR LUMBAR FUSION 4 LEVEL N/A 12/30/2013   Procedure: Thoracic one-Thoracic five posterior thoracic fusion with pedicle screws;  Surgeon: Ashok Pall, Sarah [name];  Location: [place] NEURO ORS;  Service: Neurosurgery;  Laterality: N/A;  Thoracic one-Thoracic five posterior thoracic fusion  with pedicle screws  . THYROIDECTOMY N/A 01/09/2014   Procedure: TOTAL THYROIDECTOMY;  Surgeon: Izora Gala, Sarah [name];  Location: [place];  Service: ENT;  Laterality: N/A;  . TONSILLECTOMY          Family History  Problem Relation Age of Onset  . Hypertension Mother   . Diabetes Mother   . Hypertension Father   . Stroke Father   . Diabetes Father   . Diabetes Sister   . Diabetes Sister    Social History:  reports that she has never smoked. She has never used smokeless tobacco. She reports that she does not drink alcohol or use drugs. Allergies:       Allergies  Allergen Reactions  . Bee Venom Anaphylaxis  . Keflex [Cephalexin] Nausea And Vomiting  . Tramadol Nausea And Vomiting  . Gabapentin Nausea And Vomiting  . Heparin Other (See Comments)    Weak positive platelets induced antibodies, SRA negative--2015  . Hydrochlorothiazide Nausea And Vomiting  . Hydrocodone Nausea And Vomiting  . Oxycodone-Acetaminophen Nausea And Vomiting         Medications Prior to Admission  Medication Sig Dispense Refill  . baclofen (LIORESAL) 20 MG tablet TAKE (1) TABLET THREE TIMES DAILY. 90 tablet 1  . colchicine 0.6 MG tablet TAKE&nbsp;&nbsp;(1)&nbsp;&nbsp;TABLET TWICE A DAY AS NEEDED. 30 tablet 3  . fluticasone (FLONASE) 50 MCG/ACT nasal spray Place 2 sprays into both nostrils daily. 16 g 6  . furosemide (LASIX) 20 MG tablet Take 1 tablet (20 mg total) by mouth daily as needed. (Patient taking differently: Take 20 mg by mouth daily as needed for fluid. ) 30 tablet 1  . levothyroxine (SYNTHROID, LEVOTHROID) 137 MCG tablet Take 137 mcg by mouth daily before breakfast.    . lisinopril (PRINIVIL,ZESTRIL) 20 MG tablet Take 2 tablets (40 mg total) by mouth daily. 60 tablet 1  . NIFEdipine (PROCARDIA XL) 30 MG 24 hr tablet Take 1 tablet (30 mg total) by mouth daily. 30 tablet 1  . ondansetron (ZOFRAN) 4 MG tablet Take 1 tablet (4 mg total) by mouth every 6 (six) hours. 30 tablet 5  .  pantoprazole (PROTONIX) 40 MG tablet Take 1 tablet (40 mg total) by mouth daily. 30 tablet 2  . rivaroxaban (XARELTO) 20 MG TABS tablet Take 1 tablet (20 mg total) by mouth daily with supper. Hold 3 days prior to cholecystectomy 30 tablet 3  . senna (SENOKOT) 8.6 MG TABS tablet Take 2 tablets (17.2 mg total) by mouth daily. 120 each 0  . sertraline (ZOLOFT) 25 MG tablet Take 1 tablet (25 mg total) by mouth daily. 30 tablet 3  . fondaparinux (ARIXTRA) 2.5 MG/0.5ML SOLN injection Inject 0.5 mLs (2.5 mg total) into the skin daily at 6 (six) AM. (Patient not taking: Reported on 09/02/2017) 2.5 mL 3    Home: Home Living Family/patient expects to be discharged to:: Inpatient rehab Living Arrangements: Other relatives Available Help at Discharge: Available PRN/intermittently Type of Home: House Home Access: Ramped entrance Home Layout: One level Bathroom Shower/Tub: Tub/shower unit(pt unable to access BR) Bathroom Toilet: Handicapped height(pt unable to access BR) Bathroom Accessibility: No Home Equipment: Environmental consultant -  2 wheels, Bedside commode, Shower seat, Wheelchair - power, Wheelchair - manual, Hospital bed, Other (comment)(sliding board) Additional Comments: Family reports that manual chair is falling apart  Functional History: Prior Function Level of Independence: Needs assistance Gait / Transfers Assistance Needed: Slide board transfers and ambulating short distances with RW prior to decline ADL's / Homemaking Assistance Needed: Pt performs bed/sink baths. Family performed cooking/cleaning tasks Comments: Pt uses manual chair most often for past 3 years. Pt was using RW for short distances with therapy only  until May 2019. Functional Status:  Mobility: Bed Mobility Overal bed mobility: Needs Assistance Bed Mobility: Supine to Sit Supine to sit: +2 for physical assistance, Max assist General bed mobility comments: pt up in chair  Transfers Overall transfer level: Needs  assistance Equipment used: None Transfers: Squat Pivot Transfers Squat pivot transfers: +2 physical assistance, Max assist General transfer comment: unable to safely attempt with +1 assist   ADL: ADL Overall ADL's : Needs assistance/impaired Eating/Feeding: Independent Grooming: Wash/dry hands, Wash/dry face, Oral care, Brushing hair, Set up, Sitting(supported sitting) Upper Body Bathing: Set up, Sitting(supported sitting ) Lower Body Bathing: Maximal assistance, Bed level, Sitting/lateral leans Upper Body Dressing : Minimal assistance, Sitting Lower Body Dressing: Total assistance, Sit to/from stand Toilet Transfer: Total assistance, BSC Toileting- Clothing Manipulation and Hygiene: Total assistance, Bed level Functional mobility during ADLs: Total assistance General ADL Comments: Pt T4 para.  Requires UE support to maintain unsupported sitting, and unable to access feet   Cognition: Cognition Overall Cognitive Status: Within Functional Limits for tasks assessed Orientation Level: Oriented X4 Cognition Arousal/Alertness: Awake/alert Behavior During Therapy: WFL for tasks assessed/performed Overall Cognitive Status: Within Functional Limits for tasks assessed  Blood pressure (!) 156/87, pulse 68, temperature 97.8 F (36.6 C), temperature source Oral, resp. rate 16, height 5' 10.98" (1.803 m), weight 99 kg (218 lb 4.1 oz), last menstrual period 10/22/2016, SpO2 100 %. Physical Exam  Constitutional: She is oriented to person, place, and time. She appears well-developed.  HENT:  Head: Normocephalic.  Eyes: Pupils are equal, round, and reactive to light.  Neck: Normal range of motion.  Cardiovascular: Normal rate.  Respiratory: Effort normal.  GI: Soft.  Musculoskeletal: She exhibits edema.  Neurological: She is alert and oriented to person, place, and time. No cranial nerve deficit.  UE 5/5. B/L LE 0/5 no resting tone today. Sensory tr to 1/2 bilateral LE's. Right heel cord  a bit tight.  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 Does the need for close, 24 hr/day medical supervision in concert with the patient's rehab needs make it unreasonable for this patient to be served in a less intensive setting? Yes 2. Co-Morbidities requiring supervision/potential complications: pain mgt, wound care, neurogenic bowel and bladder 3. Due to bladder management, bowel management, safety, skin/wound care, disease management, medication administration, pain management and patient education, does the patient require 24 hr/day rehab nursing? Yes 4. Does the patient require coordinated care of a physician, rehab nurse, PT (1-2 hrs/day, 5 days/week) and OT (1-2 hrs/day, 5 days/week) to address physical and functional deficits in the context of the above medical diagnosis(es)? Yes Addressing deficits in the following areas: balance, endurance, locomotion, strength, transferring, bowel/bladder control, bathing, dressing, feeding, grooming, toileting and psychosocial support 5. Can the patient actively participate in an intensive therapy program of at least 3 hrs of  therapy per day at least 5 days per week? Yes 6. The potential for patient to make measurable gains while on inpatient rehab is excellent 7. Anticipated functional outcomes upon discharge from inpatient rehab are modified independent  with PT, modified independent, supervision and min assist with OT, n/a with SLP. Likely wheel chair level goals.  8. Estimated rehab length of stay to reach the above functional goals is: 20-24 days 9. Anticipated D/C setting: Home 10. Anticipated post D/C treatments: HH therapy and Outpatient therapy 11. Overall Rehab/Functional Prognosis: good  RECOMMENDATIONS: This patient's condition is appropriate for continued rehabilitative care in the following  setting: CIR Patient has agreed to participate in recommended program. Yes Note that insurance prior authorization may be required for reimbursement for recommended care.   Comment: Pt is very well known to me. She is extremely motivated and has proven very resilient over the years. Rehab Admissions Coordinator to follow up.  Thanks,  Sarah Staggers, Sarah [name], Mellody Drown  I have personally performed a face to face diagnostic evaluation of this patient. Additionally, I have reviewed and concur with the physician assistant's documentation above.       Lavon Paganini Angiulli, PA-C 09/07/2017          Revision History              Routing History

## 2017-09-09 NOTE — Progress Notes (Signed)
[place] PHYSICAL MEDICINE & REHABILITATION     PROGRESS NOTE    Subjective/Complaints: Had a reasonable night. Denies pain. Slept well. No breathing issues, no palpitations  ROS: Patient denies fever, rash, sore throat, blurred vision, nausea, vomiting, diarrhea, cough, shortness of breath or chest pain,  headache, or mood change.   Objective:  Ct Thoracic Spine Wo Contrast  Result Date: 09/09/2017 CLINICAL DATA:  Neck hematoma, traumatic. Bone neoplasm. Check for local recurrence EXAM: CT THORACIC SPINE WITHOUT CONTRAST TECHNIQUE: Multidetector CT images of the thoracic were obtained using the standard protocol without intravenous contrast. COMPARISON:  Thoracic MRI 09/02/2017.  Thoracic spine CT 09/03/2017 FINDINGS: Alignment: Stable alignment with mild exaggerated thoracic kyphosis. Vertebrae: There is known malignant erosion of the T3, T2, and T4 bodies with chronic T3 collapse. Status post remote laminectomy with more recent reopening for recurrent tumor excision. There is posterior hardware spanning T1-T5, which is well seated. No progressive erosion or interval fracture seen. Paraspinal and other soft tissues: High-density material within the incision, both subcutaneous and deep, measuring up to 16 mm thickness, 8 cm craniocaudal, and 7 cm in depth. The fluid is high-density compatible with blood products. At the level of the laminectomy, mass effect on the thecal sac would not be detectable by this technique. Degree of residual tumor is also not well established by this technique. Disc levels: Diffuse spondylosis with bridging osteophytes from T4 to T12. There are small calcified disc protrusions at T7-8 and T8-9. IMPRESSION: Interval T3 spinal canal tumor debulking with hemolymphatic material in the subcutaneous and deep incision measuring 40-50 cc. No detected change in osseous structures or posterior hardware. Electronically Signed   By: Monte Fantasia M.D.   On: 09/09/2017 12:51    Recent Labs    09/09/17 0619 09/09/17 0844  WBC 11.0*  --   HGB 6.2* 6.5*  HCT 19.6* 20.4*  PLT 184  --    Recent Labs    09/09/17 0619  NA 138  K 4.3  CL 109  GLUCOSE 172*  BUN 26*  CREATININE 1.00  CALCIUM 7.2*   CBG (last 3)  No results for input(s): GLUCAP in the last 72 hours.  Wt Readings from Last 3 Encounters:  09/09/17 105.3 kg (232 lb 2.3 oz)  09/07/17 105.6 kg (232 lb 12.9 oz)  08/09/17 96 kg (211 lb 10.3 oz)     Intake/Output Summary (Last 24 hours) at 09/09/2017 1255 Last data filed at 09/09/2017 1001 Gross per 24 hour  Intake 240 ml  Output 500 ml  Net -260 ml    Vital Signs: Blood pressure 107/69, pulse 72, temperature 98 F (36.7 C), temperature source Oral, resp. rate 18, height 5\' 11"  (1.803 m), weight 105.3 kg (232 lb 2.3 oz), last menstrual period 10/22/2016, SpO2 100 %. Physical Exam:  Constitutional: No distress . Vital signs reviewed. obese HEENT: EOMI, oral membranes moist Neck: supple Cardiovascular: RRR without murmur. No JVD    Respiratory: CTA Bilaterally without wheezes or rales. Normal effort    GI: BS +, non-tender, non-distended  Musculoskeletal: She exhibitsedema(left knee, LLE). Left knee swollen Neurological: UE motor 5/5. LE 0/5 HF, KE, ADF/PF. ?1/2 sensation below waist. No resting tone Skin: She isnot diaphoretic. Multiple abrasions and lacs around left knee/lower leg. Back incision dressedwith old, dry blood near incision Psychiatric: pleasant and cooperative.     Assessment/Plan: 1. Functional deficits secondary to thoracic myelopathy which require 3+ hours per day of interdisciplinary therapy in a comprehensive inpatient rehab setting. Physiatrist  is providing close team supervision and 24 hour management of active medical problems listed below. Physiatrist and rehab team continue to assess barriers to discharge/monitor patient progress toward functional and medical goals.  Function:  Bathing Bathing  position      Bathing parts      Bathing assist        Upper Body Dressing/Undressing Upper body dressing                    Upper body assist        Lower Body Dressing/Undressing Lower body dressing                                  Lower body assist        Toileting Toileting          Toileting assist     Transfers Chair/bed transfer Chair/bed transfer activity did not occur: Safety/medical concerns           Locomotion Ambulation Ambulation activity did not occur: Safety/medical concerns         Wheelchair Wheelchair activity did not occur: Safety/medical concerns Type: Manual      Cognition Comprehension    Expression    Social Interaction    Problem Solving    Memory     Medical Problem List and Plan: 1.Myelopathy and paraplegiasecondary to MetastaticHurthlecancer to the thoracic spine. Status post recurrence of T3 spinal tumor resection 09/03/2017 -limited activity today d/t #8 2. DVT Prophylaxis/Anticoagulation: Xarelto resumed and now held 3. Pain Management:Celebrex 200 mg every 12 hours, baclofen 20 mg 3 times daily, hydrocodone as needed as well as Valium for spasms 4. Mood:Zoloft 25 mg daily. Provide emotional support as needed.  -she has a good support network around her 5. Neuropsych: This patientiscapable of making decisions on herown behalf. 6. Skin/Wound Care:Routine skin checks 7. Fluids/Electrolytes/Nutrition: I personally reviewed the patient's labs today.   8.Acute on chronic anemia. Follow-up CBC with drop in hgb to 6.2 (6.5 on confrimation)  -T/C for 2u PRBc  -CT of operative site ordered  -celebrex and xarelto stopped  -pt comfortable and otherwise appears clinically stable 9.Hypertension. Lisinopril 40 mg daily 10.Hypothyroidism. Synthroid 11.Constipation. Laxative assistance 12.E. coli UTI.   Bactrim 13. Neurogenic bowel and bladder: voiding  trial, bowel program   LOS (Days) 1 A FACE TO FACE EVALUATION WAS PERFORMED  [name], MD 09/09/2017 12:55 PM

## 2017-09-09 NOTE — Progress Notes (Signed)
Patient information reviewed and entered into eRehab system by Aquan Kope, RN, CRRN, PPS Coordinator.  Information including medical coding and functional independence measure will be reviewed and updated through discharge.     Per nursing patient was given "Data Collection Information Summary for Patients in Inpatient Rehabilitation Facilities with attached "Privacy Act Statement-Health Care Records" upon admission.  

## 2017-09-09 NOTE — Progress Notes (Signed)
CRITICAL VALUE ALERT  Critical Value:  Hg 6.5  Date & Time Notied:  09/09/17 0945  Provider Notified: Ellamae Sia, PA  Orders Received/Actions taken: CT scan ordered Occult stool cards ordered Blood administration ordered.

## 2017-09-10 ENCOUNTER — Inpatient Hospital Stay (HOSPITAL_COMMUNITY): Payer: Medicare Other

## 2017-09-10 ENCOUNTER — Inpatient Hospital Stay (HOSPITAL_COMMUNITY): Payer: Medicare Other | Admitting: Physical Therapy

## 2017-09-10 ENCOUNTER — Inpatient Hospital Stay (HOSPITAL_COMMUNITY): Payer: Medicare Other | Admitting: Occupational Therapy

## 2017-09-10 DIAGNOSIS — M25562 Pain in left knee: Secondary | ICD-10-CM

## 2017-09-10 DIAGNOSIS — M7989 Other specified soft tissue disorders: Secondary | ICD-10-CM

## 2017-09-10 LAB — CBC
HCT: 26.8 % — ABNORMAL LOW (ref 36.0–46.0)
Hemoglobin: 8.7 g/dL — ABNORMAL LOW (ref 12.0–15.0)
MCH: 30.3 pg (ref 26.0–34.0)
MCHC: 32.5 g/dL (ref 30.0–36.0)
MCV: 93.4 fL (ref 78.0–100.0)
PLATELETS: 169 10*3/uL (ref 150–400)
RBC: 2.87 MIL/uL — ABNORMAL LOW (ref 3.87–5.11)
RDW: 16 % — ABNORMAL HIGH (ref 11.5–15.5)
WBC: 12.3 10*3/uL — ABNORMAL HIGH (ref 4.0–10.5)

## 2017-09-10 MED ORDER — BISACODYL 10 MG RE SUPP
10.0000 mg | Freq: Every day | RECTAL | Status: DC
  Administered 2017-09-10: 10 mg via RECTAL
  Filled 2017-09-10 (×2): qty 1

## 2017-09-10 MED ORDER — LACTATED RINGERS IV SOLN
INTRAVENOUS | Status: DC
  Administered 2017-09-11: 10:00:00 via INTRAVENOUS
  Administered 2017-09-11: 50 mL/h via INTRAVENOUS
  Administered 2017-09-11: 11:00:00 via INTRAVENOUS

## 2017-09-10 NOTE — Progress Notes (Signed)
Occupational Therapy Session Note  Patient Details  Name: TIMIKA MUENCH MRN: [id number] Date of Birth: 08-31-1965  Today's Date: 09/10/2017 OT Individual Time: 1125-1210 OT Individual Time Calculation (min): 45 min    Skilled Therapeutic Interventions/Progress Updates: patient stated she was fatigued but agreed to work on trunk "pushups" against sink w/c level, thoracic extension and trunk motility (patient with very little active movement in front chest and abs);     She also worked on endurance activities w/c level.  This clinician attempted to adjust her w/c head support but was unsuccessful in moving it into a position that provided neutral cervical alignment.   At times a pillow was placed behind her back and at other times her chair was tilted back to help with alignment.  Patient was left with table and call bell within reach as well as he cell phone.   She requested the room phone be left behind her chair as she stated she did not want to use that particular phone    Therapy Documentation Precautions:  Precautions Precautions: Fall, Back Precaution Comments: throaic region, LEs spasicity  Restrictions Weight Bearing Restrictions: No  Pain denied Vision Baseline Vision/History: Wears glasses Wears Glasses: Reading only Patient Visual Report: No change from baseline Vision Assessment?: No apparent visual deficits    Therapy/Group: Individual Therapy  Alfredia Ferguson [facility] 09/10/2017, 1:55 PM

## 2017-09-10 NOTE — Progress Notes (Signed)
RLE venous duplex prelim: negative for DVT.  Evelen Vazguez Eunice, RDMS, RVT  

## 2017-09-10 NOTE — Progress Notes (Signed)
Spoke with orthopedic service Dr. Marlou Sa regards to latest x-ray and findings.  Patient made n.p.o. for now possible need for surgical intervention this evening.  Orthopedic services to follow-up

## 2017-09-10 NOTE — Progress Notes (Signed)
[facility] PHYSICAL MEDICINE & REHABILITATION     PROGRESS NOTE    Subjective/Complaints: Denies problems over night. Slept well. No pain.   ROS: Patient denies fever, rash, sore throat, blurred vision, nausea, vomiting, diarrhea, cough, shortness of breath or chest pain, joint or back pain, headache, or mood change.   Objective:  Ct Thoracic Spine Wo Contrast  Result Date: 09/09/2017 CLINICAL DATA:  Neck hematoma, traumatic. Bone neoplasm. Check for local recurrence EXAM: CT THORACIC SPINE WITHOUT CONTRAST TECHNIQUE: Multidetector CT images of the thoracic were obtained using the standard protocol without intravenous contrast. COMPARISON:  Thoracic MRI 09/02/2017.  Thoracic spine CT 09/03/2017 FINDINGS: Alignment: Stable alignment with mild exaggerated thoracic kyphosis. Vertebrae: There is known malignant erosion of the T3, T2, and T4 bodies with chronic T3 collapse. Status post remote laminectomy with more recent reopening for recurrent tumor excision. There is posterior hardware spanning T1-T5, which is well seated. No progressive erosion or interval fracture seen. Paraspinal and other soft tissues: High-density material within the incision, both subcutaneous and deep, measuring up to 16 mm thickness, 8 cm craniocaudal, and 7 cm in depth. The fluid is high-density compatible with blood products. At the level of the laminectomy, mass effect on the thecal sac would not be detectable by this technique. Degree of residual tumor is also not well established by this technique. Disc levels: Diffuse spondylosis with bridging osteophytes from T4 to T12. There are small calcified disc protrusions at T7-8 and T8-9. IMPRESSION: Interval T3 spinal canal tumor debulking with hemolymphatic material in the subcutaneous and deep incision measuring 40-50 cc. No detected change in osseous structures or posterior hardware. Electronically Signed   By: Monte Fantasia M.D.   On: 09/09/2017 12:51   Ct Lumbar Spine Wo  Contrast  Result Date: 09/09/2017 CLINICAL DATA:  Hematoma, neck.  Drop in hematocrit. EXAM: CT LUMBAR SPINE WITHOUT CONTRAST TECHNIQUE: Multidetector CT imaging of the lumbar spine was performed without intravenous contrast administration. Multiplanar CT image reconstructions were also generated. COMPARISON:  09/02/2017 MRI FINDINGS: Segmentation: 5 lumbar type vertebral bodies. Alignment: Mild levoscoliosis. Vertebrae: No evidence of fracture, discitis, or aggressive bone lesion. Paraspinal and other soft tissues: Negative for hematoma. Disc levels: Congenitally narrow spinal canal with bulky spondylitic spurring and generalized degenerative facet hypertrophy throughout the lumbar levels. The spinal canal and foramina are better assessed by recent MRI. IMPRESSION: 1. Negative for hematoma. 2. Bulky spondylosis and facet arthropathy throughout the lumbar spine, with spinal stenosis exacerbated by short pedicles. See MRI from 1 week prior. Electronically Signed   By: Monte Fantasia M.D.   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 EXAM: CT PELVIS WITHOUT CONTRAST TECHNIQUE: Multidetector CT imaging of the pelvis was performed following the standard protocol without intravenous contrast. COMPARISON:  Lumbar spine CT today reported separately. CT Abdomen and Pelvis 08/09/2017. FINDINGS: Urinary Tract: Foley catheter in place. The urinary bladder is decompressed. The visible ureters are decompressed and unremarkable. Bowel: Retained stool in the rectosigmoid colon similar to the CT last month. Visible large and small bowel loops are nondilated. Vascular/Lymphatic: Vascular patency is not evaluated in the absence of IV contrast. No lymphadenopathy. Reproductive:  Negative. Other: No pelvic free fluid. There is increased nonspecific presacral stranding. See series 4, image 32. Since May there is new  widespread nonspecific subcutaneous fat stranding throughout the lateral abdominal wall and proximal lower extremities bilaterally, but greater  on the left at the level of the proximal thigh as seen on series 4, image 60. no soft tissue fluid collection in these regions. Musculoskeletal: Bulky degenerative osteophytosis in the visible lower lumbar spine and about the hips, greater on the right. Bone mineralization remains within normal limits. There is no acute, destructive, or suspicious osseous lesion identified. IMPRESSION: 1. Unchanged pelvic osseous structures since May with no acute or suspicious osseous lesion identified. 2. Nonspecific bilateral lateral abdominal wall, presacral, flank, and proximal thigh subcutaneous edema/stranding. The left proximal thigh is asymmetrically more affected as seen on series 4, image 60. Query cellulitis or anasarca. Electronically Signed   By: Genevie Ann M.D.   On: 09/09/2017 21:17   Recent Labs    09/09/17 0619 09/09/17 0844 09/10/17 0523  WBC 11.0*  --  12.3*  HGB 6.2* 6.5* 8.7*  HCT 19.6* 20.4* 26.8*  PLT 184  --  169   Recent Labs    09/09/17 0619  NA 138  K 4.3  CL 109  GLUCOSE 172*  BUN 26*  CREATININE 1.00  CALCIUM 7.2*   CBG (last 3)  No results for input(s): GLUCAP in the last 72 hours.  Wt Readings from Last 3 Encounters:  09/09/17 105.3 kg (232 lb 2.3 oz)  09/07/17 105.6 kg (232 lb 12.9 oz)  08/09/17 96 kg (211 lb 10.3 oz)     Intake/Output Summary (Last 24 hours) at 09/10/2017 1229 Last data filed at 09/10/2017 0500 Gross per 24 hour  Intake 896.67 ml  Output 2200 ml  Net -1303.33 ml    Vital Signs: Blood pressure 120/70, pulse 80, temperature 98.6 F (37 C), temperature source Oral, resp. rate 18, height 5\' 11"  (1.803 m), weight 105.3 kg (232 lb 2.3 oz), last menstrual period 10/22/2016, SpO2 100 %. Physical Exam:  Constitutional: No distress . Vital signs reviewed. Obese HEENT: EOMI, oral membranes moist Neck:  supple Cardiovascular: RRR without murmur. No JVD    Respiratory: CTA Bilaterally without wheezes or rales. Normal effort    GI: BS +, non-tender, non-distended  Musculoskeletal: She exhibitspersistent left knee edema .   Neurological: UE motor 5/5. LE 0/5 HF, KE, ADF/PF. ?1/2 sensation below waist. No resting tone--motor exam stable Skin: She isnot diaphoretic. Multiple abrasions and lacs around left knee/lower leg. Back incision dressedwith old, dry blood near incision Psychiatric: pleasant and cooperative.     Assessment/Plan: 1. Functional deficits secondary to thoracic myelopathy which require 3+ hours per day of interdisciplinary therapy in a comprehensive inpatient rehab setting. Physiatrist is providing close team supervision and 24 hour management of active medical problems listed below. Physiatrist and rehab team continue to assess barriers to discharge/monitor patient progress toward functional and medical goals.  Function:  Bathing Bathing position   Position: Bed  Bathing parts Body parts bathed by patient: Right arm, Left arm, Chest, Abdomen Body parts bathed by helper: Front perineal area, Buttocks, Right upper leg, Left upper leg, Right lower leg, Left lower leg, Back  Bathing assist Assist Level: Touching or steadying assistance(Pt > 75%)      Upper Body Dressing/Undressing Upper body dressing   What is the patient wearing?: Pull over shirt/dress     Pull over shirt/dress - Perfomed by patient: Thread/unthread right sleeve, Thread/unthread left sleeve, Put head through opening Pull over shirt/dress - Perfomed by helper: Pull shirt over trunk        Upper body assist Assist Level: Touching or steadying assistance(Pt > 75%)      Lower  Body Dressing/Undressing Lower body dressing   What is the patient wearing?: Pants, Ted Hose, Shoes       Pants- Performed by helper: Thread/unthread right pants leg, Thread/unthread left pants leg, Pull pants  up/down           Shoes - Performed by helper: Don/doff right shoe, Don/doff left shoe, Fasten right, Fasten left       TED Hose - Performed by helper: Don/doff right TED hose, Don/doff left TED hose  Lower body assist Assist for lower body dressing: Touching or steadying assistance (Pt > 75%), 2 Helpers      Naval architect activity did not occur: No continent bowel/bladder event        Toileting assist     Transfers Chair/bed transfer Chair/bed transfer activity did not occur: Safety/medical concerns Chair/bed transfer method: Lateral scoot Chair/bed transfer assist level: 2 helpers(+2 for safety - pt mod A) Chair/bed transfer assistive device: Sliding board     Locomotion Ambulation Ambulation activity did not occur: Safety/medical concerns         Wheelchair Wheelchair activity did not occur: Safety/medical concerns Type: Manual      Cognition Comprehension Comprehension assist level: Understands complex 90% of the time/cues 10% of the time  Expression Expression assist level: Expresses complex ideas: With no assist  Social Interaction Social Interaction assist level: Interacts appropriately with others with medication or extra time (anti-anxiety, antidepressant).  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 Status post recurrence of T3 spinal tumor resection 09/03/2017 -therapy as tolerated today 2. DVT Prophylaxis/Anticoagulation: Xarelto resumed and now held 3. Pain Management:Celebrex 200 mg every 12 hours, baclofen 20 mg 3 times daily, hydrocodone as needed as well as Valium for spasms 4. Mood:Zoloft 25 mg daily. Provide emotional support as needed.  -in good spirits at  present 5. Neuropsych: This patientiscapable of making decisions on herown behalf. 6. Skin/Wound Care:Routine skin checks 7. Fluids/Electrolytes/Nutrition: I personally reviewed the patient's labs today.   8.Acute on chronic anemia. CT of T-L spine without bleed  -s/p 2u PRBC,  -hgb 8.7 today  -no signs of active blood loss  -continue to follow serially 9.Hypertension. Lisinopril 40 mg daily 10.Hypothyroidism. Synthroid 11.Constipation. Laxative assistance, adjust to home regimen 12.E. coli UTI.   Bactrim 13. Neurogenic bowel and bladder: voiding trial, bowel program 14. Persistent left knee effusion: check xray today   LOS (Days) 2 A FACE TO FACE EVALUATION WAS PERFORMED  [name], MD 09/10/2017 12:29 PM

## 2017-09-10 NOTE — Progress Notes (Signed)
Occupational Therapy Note  Patient Details  Name: Sarah [name] MRN: [id number] Date of Birth: July 01, 1965    Discussed with RN about current lab values and  no current/ updated labs posted since blood transfusion. Await for current labs.  Will check back ~10 am. Missed this session at 830am.   Willeen Cass [facility] 09/10/2017, 8:25 AM

## 2017-09-10 NOTE — Progress Notes (Signed)
Physical Therapy Note  Patient Details  Name: VERONIKA HEARD MRN: [id number] Date of Birth: 27-Apr-1965 Today's Date: 09/10/2017    Pt on bedrest due to new find of L displaced femur fx.  Awaiting ortho work up.   Michel Santee 09/10/2017, 3:21 PM

## 2017-09-10 NOTE — Progress Notes (Signed)
Occupational Therapy Note  Patient Details  Name: Sarah [name] MRN: [id number] Date of Birth: 22-Dec-1965  Today's Date: 09/10/2017 OT Missed Time: 85 Minutes Missed Time Reason: Patient on bedrest  Pt missed 30 mins skilled OT services.  Pt on bed rest pending surgery.   Leotis Shames [facility] 09/10/2017, 2:30 PM

## 2017-09-10 NOTE — Progress Notes (Signed)
Called OR to inquire about a potential surgery time for patient tonight or tomorrow with Dr. Marlou Sa.  No surgery scheduled as of now.  Started pre-procedure check list just in case.  Patient has been NPO since lunch today.  CHG bath performed at same time as foley care by NT.  No consent form completed yet, as no orders have been entered.  Awaiting further instruction.  Brita Romp, RN

## 2017-09-10 NOTE — Progress Notes (Signed)
Social Work  Social Work Assessment and Plan  Patient Details  Name: Sarah [name] MRN: [id number] Date of Birth: 1966-01-18  Today's Date: 09/10/2017  Problem List:  Patient Active Problem List   Diagnosis Date Noted  . Paraplegia ([place]) 09/08/2017  . Obesity (BMI 30-39.9) 09/03/2017  . Status post surgery 09/03/2017  . Calculus of gallbladder without cholecystitis without obstruction   . Biliary dyskinesia   . Intractable nausea and vomiting 08/09/2017  . Thrombocytopenia ([place]) 08/09/2017  . Elevated bilirubin 08/09/2017  . AKI (acute kidney injury) ([place]) 08/09/2017  . Nasal congestion 04/05/2017  . Breast pain 01/26/2017  . Upper respiratory infection, viral 01/26/2017  . Counseling regarding advanced care planning and goals of care 01/02/2017  . Bilateral rotator cuff syndrome 02/12/2016  . E. coli UTI 08/01/2015  . Constipation due to neurogenic bowel 08/01/2015  . Muscle spasticity   . 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 Spastic paraparesis   . Neurogenic bladder   . History of pulmonary embolism   . Post-operative pain   . Metastatic cancer ([place])   . Steroid-induced hyperglycemia   . Depression   . Obstipation   . Thoracic spine tumor 07/06/2015  . 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 ([place]) 07/06/2015  . Spinal cord compression due to malignant neoplasm metastatic to spine ([place])   . Postoperative hypothyroidism   . 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 Spasticity 09/19/2014  . Dysuria 09/04/2014  . Hypertension 05/01/2014  . Ingrown right big toenail 05/01/2014  . GERD (gastroesophageal reflux disease) 02/14/2014  . Dysphagia, pharyngoesophageal phase 02/06/2014  . Type 2 diabetes mellitus without complication ([place]) 29/52/8413  . Constipation   . Neurogenic bowel   . Paraplegia at T4 level ([place]) 01/20/2014  . Metastatic cancer to spine ([place]) 01/19/2014  . Rotator cuff tendonitis   . Hurthle cell neoplasm of thyroid  12/30/2013  . 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 Leg weakness, bilateral 12/13/2013   Past Medical History:  Past Medical History:  Diagnosis Date  . [facility])    FNA of thyroid positive for onconytic/hurthle cell carcinoma  . Chronic back pain   . DDD (degenerative disc disease), cervical   . DJD (degenerative joint disease)   . History of rectal fissure   . HIT (heparin-induced thrombocytopenia) ([place])   . Hypertension   . Obesity   . Paraplegia at T4 level ([place])   . Pulmonary embolus, right ([place]) 2015  . Rotator cuff tendonitis right   Past Surgical History:  Past Surgical History:  Procedure Laterality Date  . LAMINECTOMY N/A 12/14/2013   Procedure: THORACIC LAMINECTOMY FOR TUMOR THORACIC THREE;  Surgeon: Ashok Pall, MD;  Location: [facility];  Service: Neurosurgery;  Laterality: N/A;  THORACIC LAMINECTOMY FOR TUMOR THORACIC THREE  . LAMINECTOMY N/A 07/05/2015   Procedure: THORACIC LAMINECTOMY FOR TUMOR;  Surgeon: Ashok Pall, MD;  Location: [facility];  Service: Neurosurgery;  Laterality: N/A;  . LAMINECTOMY N/A 09/03/2017   Procedure: POSTERIOR SPINAL TUMOR RESECTION THORACIC THREE;  Surgeon: Ashok Pall, MD;  Location: [facility];  Service: Neurosurgery;  Laterality: N/A;  POSTERIOR SPINAL TUMOR RESECTION THORACIC THREE  . POSTERIOR LUMBAR FUSION 4 LEVEL N/A 12/30/2013   Procedure: Thoracic one-Thoracic five posterior thoracic fusion with pedicle screws;  Surgeon: Ashok Pall, MD;  Location: [facility];  Service: Neurosurgery;  Laterality: N/A;  Thoracic one-Thoracic five posterior thoracic fusion with pedicle screws  . THYROIDECTOMY N/A 01/09/2014   Procedure: TOTAL  THYROIDECTOMY;  Surgeon: Izora Gala, MD;  Location: [facility];  Service: ENT;  Laterality: N/A;  . TONSILLECTOMY     Social History:  reports that she has never smoked. She has never used smokeless tobacco. She reports that she does not drink alcohol or use drugs.  Family / Support  Systems Marital Status: Single Patient Roles: Other (Comment)(sibling & daughter) Other Supports: Raelene Bott [phone]   [name] Hickman-sister [phone] Anticipated Caregiver: Sister, brother in-law and Mom Ability/Limitations of Caregiver: Sister works part time for the post office and Mom lives 4 minutes away Caregiver Availability: Evenings only Family Dynamics: Very close knit fmaily who has provided care to pt in the past and will again. They rely upon one another and will do whatever is needed for pt to assist her in her journey.  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 She wants to get back to her ambulation with a rolling walker, she was going to OP neuro prior to this admission. She would like to return there when rehab is completed. Recent Psychosocial Issues: cancer she has been fighting for a while now Pyschiatric History: No history has benefited from seeing neuro-psych in the past and will have him see her again with this admission for coping and support. She is open to this and is familiar with Dr. Sima Matas Substance Abuse History: No issues  Patient / Family Perceptions, Expectations & Goals Pt/Family understanding of illness & functional limitations: Pt and sister have a good  understanding of her surgery and treatment plan going forward. She tries to ask all of her questions and be as informed as possible about moving forward. She is future oriented and wants to be as independent as possible before leaving rehab. Premorbid pt/family roles/activities: Sister, daughter, friend, etc Anticipated changes in roles/activities/participation: resume Pt/family expectations/goals: Pt states: " I want to get back as much function as possible before going home, I would like to be mod/i if possible."  Sister states: " I hope she does will here she usually does."  [employer]: Other (Comment)(Active with [facility]-RN and has been to OP neuro) Premorbid Home Care/DME Agencies: Other (Comment)(has all needed equipment) Transportation available at discharge: Family Resource referrals recommended: Neuropsychology, Support group (specify)  Discharge Planning Living Arrangements: Other relatives Support Systems: Parent, Other relatives, Friends/neighbors Type of Residence: Private residence Insurance Resources: Multimedia programmer (specify)(UHC-Medicare) Financial Resources: SSD, Family Support Financial Screen Referred: No Living Expenses: Lives with family Money Management: Patient, Family Does the patient have any problems obtaining your medications?: No Home Management: family members Patient/Family Preliminary Plans: Return home with sister and brother in-law with Mom assisitng if needed. She is very familiar with the rehab program has been here on two other occassions. Will await team evaluations and work on plans. Had medical issues yesterday and could not leave the room so hopefully will be able to today. Social Work Anticipated Follow Up Needs: HH/OP, Support Group  Clinical Impression Pleasant female who is very familiar with the rehab program and is glad to be back, knowing she will get intensive therapies which she needs. Will work with on discharge  plans and have neuro-psych see for coping.  Elease Hashimoto 09/10/2017, 8:26 AM

## 2017-09-10 NOTE — Progress Notes (Signed)
X-rays of left knee completed showing displaced oblique distal left femoral metadiaphyseal fracture.  Will consult orthopedic services in regards to plan of care.  Will place patient on bedrest and nonweightbearing at this time.

## 2017-09-10 NOTE — Progress Notes (Signed)
Femur fracture noted.  Full consult to follow.  N.p.o. for now for possible surgery tonight.

## 2017-09-10 NOTE — Progress Notes (Signed)
Patient claims she cannot tolerate liquid colace claims she does suppository at hs. Patient claims she does  not take the nifedipine if she does not take( lymvema) cancer drug. PA notified new orders noted.

## 2017-09-10 NOTE — Plan of Care (Signed)
  Problem: SCI BOWEL ELIMINATION Goal: RH STG MANAGE BOWEL WITH ASSISTANCE Description STG Manage Bowel with max Assistance.  Outcome: Not Progressing; pt on bowel program   Problem: SCI BLADDER ELIMINATION Goal: RH STG MANAGE BLADDER WITH ASSISTANCE Description STG Manage Bladder With mod Assistance  Outcome: Not Progressing; pt has foley

## 2017-09-10 NOTE — Evaluation (Signed)
Occupational Therapy Assessment and Plan  Patient Details  Name: Sarah [name] MRN: [id number] Date of Birth: 1965-09-10  OT Diagnosis: acute pain and paraplegia at level T3 Rehab Potential: Rehab Potential (ACUTE ONLY): Good ELOS: 2-2 1/2 weeks   Today's Date: 09/10/2017 OT Individual Time: 1000-1100 OT Individual Time Calculation (min): 60 min     Problem List:  Patient Active Problem List   Diagnosis Date Noted  . Paraplegia ([place]) 09/08/2017  . Obesity (BMI 30-39.9) 09/03/2017  . Status post surgery 09/03/2017  . Calculus of gallbladder without cholecystitis without obstruction   . Biliary dyskinesia   . Intractable nausea and vomiting 08/09/2017  . Thrombocytopenia ([place]) 08/09/2017  . Elevated bilirubin 08/09/2017  . AKI (acute kidney injury) ([place]) 08/09/2017  . Nasal congestion 04/05/2017  . Breast pain 01/26/2017  . Upper respiratory infection, viral 01/26/2017  . Counseling regarding advanced care planning and goals of care 01/02/2017  . Bilateral rotator cuff syndrome 02/12/2016  . E. coli UTI 08/01/2015  . Constipation due to neurogenic bowel 08/01/2015  . Muscle spasticity   . 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 Spastic paraparesis   . Neurogenic bladder   . History of pulmonary embolism   . Post-operative pain   . Metastatic cancer ([place])   . Steroid-induced hyperglycemia   . Depression   . Obstipation   . Thoracic spine tumor 07/06/2015  . 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 Spinal cord compression due to malignant neoplasm metastatic to spine ([place])   . Postoperative hypothyroidism   . 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 Spasticity 09/19/2014  . Dysuria 09/04/2014  . Hypertension 05/01/2014  . Ingrown right big toenail 05/01/2014  . GERD (gastroesophageal reflux disease) 02/14/2014  . Dysphagia, pharyngoesophageal phase 02/06/2014  . Type 2 diabetes mellitus without complication ([place]) 27/25/3664   . Constipation   . Neurogenic bowel   . Paraplegia at T4 level ([place]) 01/20/2014  . Metastatic cancer to spine ([place]) 01/19/2014  . Rotator cuff tendonitis   . 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 Leg weakness, bilateral 12/13/2013    Past Medical History:  Past Medical History:  Diagnosis Date  . Cancer [facility])    FNA of thyroid positive for onconytic/hurthle cell carcinoma  . Chronic back pain   . DDD (degenerative disc disease), cervical   . DJD (degenerative joint disease)   . History of rectal fissure   . HIT (heparin-induced thrombocytopenia) ([place])   . Hypertension   . Obesity   . Paraplegia at T4 level ([place])   . Pulmonary embolus, right ([place]) 2015  . Rotator cuff tendonitis right   Past Surgical History:  Past Surgical History:  Procedure Laterality Date  . LAMINECTOMY N/A 12/14/2013   Procedure: THORACIC LAMINECTOMY FOR TUMOR THORACIC THREE;  Surgeon: Ashok Pall, MD;  Location: [place] [facility];  Service: Neurosurgery;  Laterality: N/A;  THORACIC LAMINECTOMY FOR TUMOR THORACIC THREE  . LAMINECTOMY N/A 07/05/2015   Procedure: THORACIC LAMINECTOMY FOR TUMOR;  Surgeon: Ashok Pall, MD;  Location: [place] [facility];  Service: Neurosurgery;  Laterality: N/A;  . LAMINECTOMY N/A 09/03/2017   Procedure: POSTERIOR SPINAL TUMOR RESECTION THORACIC THREE;  Surgeon: Ashok Pall, MD;  Location: [place];  Service: Neurosurgery;  Laterality: N/A;  POSTERIOR SPINAL TUMOR RESECTION THORACIC THREE  . POSTERIOR LUMBAR FUSION 4 LEVEL N/A 12/30/2013   Procedure: Thoracic one-Thoracic five posterior thoracic fusion with pedicle screws;  Surgeon: Ashok Pall, MD;  Location: [place] [facility];  Service: Neurosurgery;  Laterality: N/A;  Thoracic one-Thoracic five posterior thoracic fusion with pedicle screws  . THYROIDECTOMY N/A 01/09/2014   Procedure: TOTAL THYROIDECTOMY;  Surgeon: Izora Gala, MD;  Location: [place];  Service: ENT;  Laterality: N/A;   . TONSILLECTOMY      Assessment & Plan Clinical Impression: [name] a 52 y.o. 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 One level home with ramped entrance. Up until recently she was using a sliding board for transfers and ambulating short distances with a rolling walker up until May 2019. She had been using a wheelchair most the time for the past few years. Patient with 2 previous back surgeries T3 in 2015 and 2017 and did receive inpatient rehab services. Patient with recent admission approximately 2-1/2 weeks ago for nausea and vomiting was later discharged to home. Presented 09/02/2017 with recent fall and increasing leg weakness over the past 2 weeks. She was initially to see her oncologist 08/24/2017 for MRI that was planned however could not make the appointment due to lower extremity weakness. She did also have some urinary incontinence. MRI and imaging revealed tumor recurrence at T3 with severe spinal cord compression. Underwent posterior spinal tumor resection T3 09/03/2017 per Dr. Christella Noa. Pain control improving.Radiation oncology follow-up Dr. Dara Lords with planning of radiation therapy. Decadron protocol as indicated. Presently maintained on Arixtra for history of DVT and pulmonary emboli.Xareltoto belater resumed. Acute on chronic anemia 10.2-10.8 and monitored.E. coli urinary tract infection initially on Rocephin and plan to simplify antibiotics.    Patient transferred to CIR on 09/08/2017 .    Patient currently requires total A for LB in bed, min A sitting EOB for UB and mod A +2 for slide board transfers due to safety with basic self-care skills and functional mobility  secondary to muscle weakness and acute pain, decreased cardiorespiratoy endurance, impaired timing and sequencing,  abnormal tone and decreased coordination and decreased sitting balance, decreased postural control and decreased balance strategies.  Prior to hospitalization, patient could complete ADL with modified independent  at w/c level with lateral leans for clothing management and transfers with slide board  Patient will benefit from skilled intervention to decrease level of assist with basic self-care skills and increase independence with basic self-care skills prior to discharge home with care partner.  Anticipate patient will require intermittent supervision and follow up home health.  OT - End of Session Activity Tolerance: Tolerates 10 - 20 min activity with multiple rests Endurance Deficit: Yes Endurance Deficit Description: fatigues quickly  OT Assessment Rehab Potential (ACUTE ONLY): Good OT Barriers to Discharge: Pending chemo/radiation OT Patient demonstrates impairments in the following area(s): Balance;Motor;Behavior;Pain;Skin Integrity;Edema;Endurance;Safety OT Basic ADL's Functional Problem(s): Grooming;Bathing;Dressing;Toileting OT Transfers Functional Problem(s): Toilet OT Additional Impairment(s): None OT Plan OT Intensity: Minimum of 1-2 x/day, 45 to 90 minutes OT Frequency: 5 out of 7 days OT Duration/Estimated Length of Stay: 2-2 1/2 weeks OT Treatment/Interventions: Balance/vestibular training;Community reintegration;Disease mangement/prevention;Neuromuscular re-education;Patient/family education;Self Care/advanced ADL retraining;Splinting/orthotics;Therapeutic Exercise;UE/LE Coordination activities;Wheelchair propulsion/positioning;Discharge planning;DME/adaptive equipment instruction;Pain management;Functional mobility training;Psychosocial support;Skin care/wound managment;Therapeutic Activities;UE/LE Strength taining/ROM OT Self Feeding Anticipated Outcome(s): n/a OT Basic Self-Care Anticipated Outcome(s): setup  OT Toileting Anticipated Outcome(s): setup  OT Bathroom  Transfers Anticipated Outcome(s): setup  with slide board OT Recommendation Recommendations for Other Services: Therapeutic Recreation consult;Neuropsych consult Therapeutic Recreation  Interventions: Stress management;Outing/community reintergration Patient destination: Home Follow Up Recommendations: Home health OT Equipment Recommended: To be determined   Skilled Therapeutic Intervention OT eval initiated on 6/19 however pt with low Hgb and needed to receive a transfusion.  Eval was postponed to today.  Self care retraining at bed level with focus on rolling, sitting balance at EOB, donning clothing, sit to stands and slide board transfer bed<tilt in space w/c. Pt with tone in LEs present with movement right >left. Pt required A with both LEs to roll in bed from max to total A +2 to complete hygiene/ bathing periarea/ bottom and LB clothing management using bed rails. Pt required +2 total A to come into sitting position at EOB from side lying. Pt required min to max A to maintain static sitting balance with use of UE for support and during donning a shirt. Pt does demonstrate a fear of falling and weight shifting forward. Pt able to tolerate 3 sit to stands using SARA Plus with walking sling (for more support)- unable to bring pelvis into neutral even with manual facilitation.  Pt does require +2 to place slide board due to decr trunk control but pt able to assist with lateral scoot along the board into the tilt in space w/c requiring mod A +2 for safety. A needed to setup up w/c and encourage weight shifts and/ or A with pressure relief. Pt able to complete grooming with setup on bedside table.   OT Evaluation Precautions/Restrictions  Precautions Precautions: Fall;Back Precaution Comments: throaic region, LEs spasicity  Restrictions Weight Bearing Restrictions: No General Chart Reviewed: Yes Family/Caregiver Present: Yes(parents)    Pain  reports aches but willing to continue  Home  Living/Prior [place] expects to be discharged to:: Private residence Living Arrangements: Other relatives Available Help at Discharge: Available PRN/intermittently Type of Home: House Home Access: Ramped entrance Home Layout: One level Bathroom Shower/Tub: Chiropodist: Handicapped height Bathroom Accessibility: No Additional Comments: Pt reports using bedside commode  Lives With: Family Prior Function Level of Independence: Independent with transfers, Requires assistive device for independence(Pt primarily utilized w/c for amb.)  Able to Take Stairs?: No Driving: No Comments: Pt uses manual chair most often for past 3 years. Pt was using RW for short distances with therapy only  until May 2019. Pt reports staying home most days with her nephew ADL ADL ADL Comments: see functional navigator Vision Baseline Vision/History: Wears glasses Wears Glasses: Reading only Patient Visual Report: No change from baseline Vision Assessment?: No apparent visual deficits Perception  Perception: Within Functional Limits Praxis Praxis: Impaired Praxis Impairment Details: Motor planning Cognition Overall Cognitive Status: Within Functional Limits for tasks assessed Arousal/Alertness: Awake/alert Orientation Level: Person;Place;Situation Person: Oriented Place: Oriented Situation: Oriented Year: 2019 Month: June Day of Week: Correct Memory: Appears intact Immediate Memory Recall: Sock;Blue;Bed Memory Recall: Sock;Blue;Bed Memory Recall Sock: Without Cue Memory Recall Blue: Without Cue Memory Recall Bed: Without Cue Attention: Focused;Sustained Focused Attention: Appears intact Sustained Attention: Appears intact Awareness: Appears intact Safety/Judgment: Appears intact Comments: a definite fear of falling  Sensation Sensation Light Touch: Impaired Detail Central sensation comments: LE 0/5 HF, KE, ADF/PF. ?1/2 sensation below  waist. No resting tone; but present with movement right>left Light Touch Impaired Details: Absent LLE;Absent RLE Proprioception: Impaired Detail Proprioception Impaired Details: Absent RLE;Absent LLE Coordination Gross Motor Movements are Fluid and Coordinated: No Fine Motor Movements are Fluid and Coordinated: Yes Coordination and Movement Description: decr active movement in LEs; tone present Motor  Motor Motor: Paraplegia;Abnormal tone Mobility  Bed Mobility Supine to Sit: 2 Helpers Sitting - Scoot to Edge of Bed: Minimal Assistance - Patient > 75%;Maximal Assistance - Patient 25-49%  Trunk/Postural Assessment  Cervical Assessment Cervical Assessment: Within Functional Limits Thoracic Assessment Thoracic Assessment: (forward flexed) Lumbar Assessment Lumbar Assessment: (posterior pelvic tilt- difficulty coming into neutral) Postural Control Postural Control: Deficits on evaluation Trunk Control: poor; requiring bilateral UE support and min to max extenal support at EOB Righting Reactions: present with UEs Protective Responses: Present with UEs  Balance Static Sitting Balance Static Sitting - Balance Support: Feet supported;Bilateral upper extremity supported Static Sitting - Level of Assistance: 4: Min assist Dynamic Sitting Balance Dynamic Sitting - Balance Support: Feet supported;No upper extremity supported Dynamic Sitting - Level of Assistance: 2: Max assist Extremity/Trunk Assessment RUE Assessment RUE Assessment: Within Functional Limits LUE Assessment LUE Assessment: Within Functional Limits   See Function Navigator for Current Functional Status.   Refer to Care Plan for Long Term Goals  Recommendations for other services: Neuropsych and Therapeutic Recreation  Stress management and Outing/community reintegration   Discharge Criteria: Patient will be discharged from OT if patient refuses treatment 3 consecutive times without medical reason, if treatment goals  not met, if there is a change in medical status, if patient makes no progress towards goals or if patient is discharged from hospital.  The above assessment, treatment plan, treatment alternatives and goals were discussed and mutually agreed upon: by patient  Nicoletta Ba 09/10/2017, 12:24 PM

## 2017-09-10 NOTE — Progress Notes (Signed)
Radiology MD called result of Xray displaced distal femur fracture; Dan PA notified;

## 2017-09-10 NOTE — Consult Note (Signed)
Reason for Consult: Left knee swelling Referring Physician: Dr. Trinna Post Sarah [name] is an 52 y.o. female.  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  This was a recurrent process but it has been excised.  She stated that she fell a week ago.  She has insensate lower extremities bilaterally.  She does however anticipate walking again.  She was walking up to about 3 weeks ago.  She does not have any pain in her lower extremities.  Past Medical History:  Diagnosis Date  . 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 Chronic back pain   . DDD (degenerative disc disease), cervical   . DJD (degenerative joint disease)   . History of rectal fissure   . HIT (heparin-induced thrombocytopenia) ([place])   . Hypertension   . Obesity   . Paraplegia at T4 level ([place])   . Pulmonary embolus, right ([place]) 2015  . Rotator cuff tendonitis right    Past Surgical History:  Procedure Laterality Date  . LAMINECTOMY N/A 12/14/2013   Procedure: THORACIC LAMINECTOMY FOR TUMOR THORACIC THREE;  Surgeon: Ashok Pall, MD;  Location: [place] [facility];  Service: Neurosurgery;  Laterality: N/A;  THORACIC LAMINECTOMY FOR TUMOR THORACIC THREE  . LAMINECTOMY N/A 07/05/2015   Procedure: THORACIC LAMINECTOMY FOR TUMOR;  Surgeon: Ashok Pall, MD;  Location: [place] NEUR[facility];  Service: Neurosurgery;  Laterality: N/A;  . LAMINECTOMY N/A 09/03/2017   Procedure: POSTERIOR SPINAL TUMOR RESECTION THORACIC THREE;  Surgeon: Ashok Pall, MD;  Location: [place];  Service: Neurosurgery;  Laterality: N/A;  POSTERIOR SPINAL TUMOR RESECTION THORACIC THREE  . POSTERIOR LUMBAR FUSION 4 LEVEL N/A 12/30/2013   Procedure: Thoracic one-Thoracic five posterior thoracic fusion with pedicle screws;  Surgeon: Ashok Pall, MD;  Location: [place] NE[facility];  Service: Neurosurgery;  Laterality: N/A;  Thoracic one-Thoracic five posterior thoracic  fusion with pedicle screws  . THYROIDECTOMY N/A 01/09/2014   Procedure: TOTAL THYROIDECTOMY;  Surgeon: Izora Gala, MD;  Location: [place];  Service: ENT;  Laterality: N/A;  . TONSILLECTOMY      Family History  Problem Relation Age of Onset  . Hypertension Mother   . Diabetes Mother   . Hypertension Father   . Stroke Father   . Diabetes Father   . Diabetes Sister   . Diabetes Sister     Social History:  reports that she has never smoked. She has never used smokeless tobacco. She reports that she does not drink alcohol or use drugs.  Allergies:  Allergies  Allergen Reactions  . Bee Venom Anaphylaxis  . Keflex [Cephalexin] Nausea And Vomiting  . Tramadol Nausea And Vomiting  . Gabapentin Nausea And Vomiting  . Heparin Other (See Comments)    Weak positive platelets induced antibodies, SRA negative--2015  . Hydrochlorothiazide Nausea And Vomiting  . Hydrocodone Nausea And Vomiting  . Oxycodone-Acetaminophen Nausea And Vomiting    Medications: I have reviewed the patient's current medications.  Results for orders placed or performed during the hospital encounter of 09/08/17 (from the past 48 hour(s))  CBC WITH DIFFERENTIAL     Status: Abnormal   Collection Time: 09/09/17  6:19 AM  Result Value Ref Range   WBC 11.0 (H) 4.0 - 10.5 K/uL   RBC 2.06 (L) 3.87 - 5.11 MIL/uL   Hemoglobin 6.2 (LL) 12.0 - 15.0 g/dL    Comment: REPEATED TO VERIFY CRITICAL RESULT CALLED TO, READ  BACK BY AND VERIFIED WITH: C EVANS RN @ [id number] ON 09/09/17 BY HTEMOCHE    HCT 19.6 (L) 36.0 - 46.0 %   MCV 95.1 78.0 - 100.0 fL   MCH 30.1 26.0 - 34.0 pg   MCHC 31.6 30.0 - 36.0 g/dL   RDW 16.8 (H) 11.5 - 15.5 %   Platelets 184 150 - 400 K/uL   Neutrophils Relative % 83 %   Lymphocytes Relative 12 %   Monocytes Relative 5 %   Eosinophils Relative 0 %   Basophils Relative 0 %   Neutro Abs 9.1 (H) 1.7 - 7.7 K/uL   Lymphs Abs 1.3 0.7 - 4.0 K/uL   Monocytes Absolute 0.6 0.1 - 1.0 K/uL   Eosinophils  Absolute 0.0 0.0 - 0.7 K/uL   Basophils Absolute 0.0 0.0 - 0.1 K/uL   RBC Morphology POLYCHROMASIA PRESENT     Comment: TEARDROP CELLS   WBC Morphology MILD LEFT SHIFT (1-5% METAS, OCC MYELO, OCC BANDS)     Comment: Performed at [facility], [place] [address], [place], [place] [address]  Comprehensive metabolic panel     Status: Abnormal   Collection Time: 09/09/17  6:19 AM  Result Value Ref Range   Sodium 138 135 - 145 mmol/L   Potassium 4.3 3.5 - 5.1 mmol/L   Chloride 109 101 - 111 mmol/L   CO2 26 22 - 32 mmol/L   Glucose, Bld 172 (H) 65 - 99 mg/dL   BUN 26 (H) 6 - 20 mg/dL   Creatinine, Ser 1.00 0.44 - 1.00 mg/dL   Calcium 7.2 (L) 8.9 - 10.3 mg/dL   Total Protein 3.8 (L) 6.5 - 8.1 g/dL   Albumin 2.1 (L) 3.5 - 5.0 g/dL   AST 22 15 - 41 U/L   ALT 18 14 - 54 U/L   Alkaline Phosphatase 146 (H) 38 - 126 U/L   Total Bilirubin 0.6 0.3 - 1.2 mg/dL   GFR calc non Af Amer >60 >60 mL/min   GFR calc Af Amer >60 >60 mL/min    Comment: (NOTE) The eGFR has been calculated using the CKD EPI equation. This calculation has not been validated in all clinical situations. eGFR's persistently <60 mL/min signify possible Chronic Kidney Disease.    Anion gap 3 (L) 5 - 15    Comment: Performed at [facility], [place] [address], [place], [place] [address]  Hemoglobin and hematocrit, blood     Status: Abnormal   Collection Time: 09/09/17  8:44 AM  Result Value Ref Range   Hemoglobin 6.5 (LL) 12.0 - 15.0 g/dL    Comment: REPEATED TO VERIFY CRITICAL RESULT CALLED TO, READ BACK BY AND VERIFIED WITH: C RVANS RN @ 0945 ON 09/09/17 BY HTEMOCHE    HCT 20.4 (L) 36.0 - 46.0 %    Comment: Performed at [facility] [address], [place], [place] [address]  Occult blood card to lab, stool     Status: None   Collection Time: 09/09/17 10:12 AM  Result Value Ref Range   Fecal Occult Bld NEGATIVE NEGATIVE    Comment: Performed at [facility] [address], [place], [place] [address]   Prepare RBC     Status: None   Collection Time: 09/09/17 10:24 AM  Result Value Ref Range   Order Confirmation      ORDER PROCESSED BY BLOOD BANK Performed at [facility], [place] [address] [place], [place] [address]   Type  and screen [place]     Status: None   Collection Time: 09/09/17 10:29 AM  Result Value Ref Range   ABO/RH(D) O POS    Antibody Screen NEG    Sample Expiration 09/12/2017    Unit Number [id number]    Blood Component Type RED CELLS,LR    Unit division 00    Status of Unit ISSUED,FINAL    Transfusion Status OK TO TRANSFUSE    Crossmatch Result      Compatible Performed at [facility], [place] [address] [place], [place] [address]    Unit Number [id number]    Blood Component Type RED CELLS,LR    Unit division 00    Status of Unit ISSUED,FINAL    Transfusion Status OK TO TRANSFUSE    Crossmatch Result Compatible   CBC     Status: Abnormal   Collection Time: 09/10/17  5:23 AM  Result Value Ref Range   WBC 12.3 (H) 4.0 - 10.5 K/uL   RBC 2.87 (L) 3.87 - 5.11 MIL/uL   Hemoglobin 8.7 (L) 12.0 - 15.0 g/dL    Comment: POST TRANSFUSION SPECIMEN   HCT 26.8 (L) 36.0 - 46.0 %   MCV 93.4 78.0 - 100.0 fL   MCH 30.3 26.0 - 34.0 pg   MCHC 32.5 30.0 - 36.0 g/dL   RDW 16.0 (H) 11.5 - 15.5 %   Platelets 169 150 - 400 K/uL    Comment: Performed at [facility], [place]. [address] [place],  [address]    Dg Knee 1-2 Views Left  Result Date: 09/10/2017 CLINICAL DATA:  Pain LEFT knee, recent fall on 09/02/2017 EXAM: LEFT KNEE - 1-2 VIEW COMPARISON:  None FINDINGS: Osseous demineralization. Joint spaces preserved. Oblique fracture of the distal LEFT femoral metadiaphysis mildly displaced medially and posteriorly. No additional fracture, dislocation or bone destruction. No knee joint effusion. IMPRESSION: Displaced oblique distal LEFT femoral metadiaphyseal fracture. Findings called to [facility] on 09/10/2017 at 1317  hours. Electronically Signed   By: Lavonia Dana M.D.   On: 09/10/2017 13:17   Ct Thoracic Spine Wo Contrast  Result Date: 09/09/2017 CLINICAL DATA:  Neck hematoma, traumatic. Bone neoplasm. Check for local recurrence EXAM: CT THORACIC SPINE WITHOUT CONTRAST TECHNIQUE: Multidetector CT images of the thoracic were obtained using the standard protocol without intravenous contrast. COMPARISON:  Thoracic MRI 09/02/2017.  Thoracic spine CT 09/03/2017 FINDINGS: Alignment: Stable alignment with mild exaggerated thoracic kyphosis. Vertebrae: There is known malignant erosion of the T3, T2, and T4 bodies with chronic T3 collapse. Status post remote laminectomy with more recent reopening for recurrent tumor excision. There is posterior hardware spanning T1-T5, which is well seated. No progressive erosion or interval fracture seen. Paraspinal and other soft tissues: High-density material within the incision, both subcutaneous and deep, measuring up to 16 mm thickness, 8 cm craniocaudal, and 7 cm in depth. The fluid is high-density compatible with blood products. At the level of the laminectomy, mass effect on the thecal sac would not be detectable by this technique. Degree of residual tumor is also not well established by this technique. Disc levels: Diffuse spondylosis with bridging osteophytes from T4 to T12. There are small calcified disc protrusions at T7-8 and T8-9. IMPRESSION: Interval T3 spinal canal tumor debulking with hemolymphatic material in the subcutaneous and deep incision measuring 40-50 cc. No detected change in osseous structures or posterior hardware. Electronically Signed   By: Monte Fantasia M.D.   On: 09/09/2017  12:51   Ct Lumbar Spine Wo Contrast  Result Date: 09/09/2017 CLINICAL DATA:  Hematoma, neck.  Drop in hematocrit. EXAM: CT LUMBAR SPINE WITHOUT CONTRAST TECHNIQUE: Multidetector CT imaging of the lumbar spine was performed without intravenous contrast administration. Multiplanar CT image  reconstructions were also generated. COMPARISON:  09/02/2017 MRI FINDINGS: Segmentation: 5 lumbar type vertebral bodies. Alignment: Mild levoscoliosis. Vertebrae: No evidence of fracture, discitis, or aggressive bone lesion. Paraspinal and other soft tissues: Negative for hematoma. Disc levels: Congenitally narrow spinal canal with bulky spondylitic spurring and generalized degenerative facet hypertrophy throughout the lumbar levels. The spinal canal and foramina are better assessed by recent MRI. IMPRESSION: 1. Negative for hematoma. 2. Bulky spondylosis and facet arthropathy throughout the lumbar spine, with spinal stenosis exacerbated by short pedicles. See MRI from 1 week prior. Electronically Signed   By: Monte Fantasia M.D.   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 EXAM: CT PELVIS WITHOUT CONTRAST TECHNIQUE: Multidetector CT imaging of the pelvis was performed following the standard protocol without intravenous contrast. COMPARISON:  Lumbar spine CT today reported separately. CT Abdomen and Pelvis 08/09/2017. FINDINGS: Urinary Tract: Foley catheter in place. The urinary bladder is decompressed. The visible ureters are decompressed and unremarkable. Bowel: Retained stool in the rectosigmoid colon similar to the CT last month. Visible large and small bowel loops are nondilated. Vascular/Lymphatic: Vascular patency is not evaluated in the absence of IV contrast. No lymphadenopathy. Reproductive:  Negative. Other: No pelvic free fluid. There is increased nonspecific presacral stranding. See series 4, image 32. Since May there is new widespread nonspecific subcutaneous fat stranding throughout the lateral abdominal wall and proximal lower extremities bilaterally, but greater on the left at the level of the proximal thigh as seen on series 4, image 60. no soft tissue fluid collection in these  regions. Musculoskeletal: Bulky degenerative osteophytosis in the visible lower lumbar spine and about the hips, greater on the right. Bone mineralization remains within normal limits. There is no acute, destructive, or suspicious osseous lesion identified. IMPRESSION: 1. Unchanged pelvic osseous structures since May with no acute or suspicious osseous lesion identified. 2. Nonspecific bilateral lateral abdominal wall, presacral, flank, and proximal thigh subcutaneous edema/stranding. The left proximal thigh is asymmetrically more affected as seen on series 4, image 60. Query cellulitis or anasarca. Electronically Signed   By: Genevie Ann M.D.   On: 09/09/2017 21:17    Review of Systems  Musculoskeletal: Positive for back pain.  All other systems reviewed and are negative.  Blood pressure 109/75, pulse 76, temperature 98.4 F (36.9 C), temperature source Oral, resp. rate 17, height 5' 11"  (1.803 m), weight 232 lb 2.3 oz (105.3 kg), last menstrual period 10/22/2016, SpO2 100 %. Physical Exam  Constitutional: She appears well-developed.  HENT:  Head: Normocephalic.  Eyes: Pupils are equal, round, and reactive to light.  Neck: Normal range of motion.  Cardiovascular: Normal rate.  Respiratory: Effort normal.  Neurological: She is alert.  Skin: Skin is warm.  Psychiatric: She has a normal mood and affect.  Examination of the lower extremities demonstrates that both feet are perfused.  Patient does have swelling beginning at the distal femur on the left extending distally.  Compartments are soft.  There is no crepitus with range of motion of the right ankle knee or hip.  There is crepitus with range of motion of the left knee but not the  ankle or the hip.  Assessment/Plan: Impression is left distal femur fracture which is in reasonably good alignment at this time.  However I think with the possibility that she may ambulate again that retrograde nail fixation would be a possible consideration for  fixation.  I will consult 1 of my partners who will likely proceed with intervention tomorrow.  Landry Dyke Jaeleigh Monaco 09/10/2017, 8:57 PM

## 2017-09-11 ENCOUNTER — Inpatient Hospital Stay (HOSPITAL_COMMUNITY): Payer: Medicare Other | Admitting: Physical Therapy

## 2017-09-11 ENCOUNTER — Encounter (HOSPITAL_COMMUNITY)
Admission: RE | Disposition: A | Discharge status: Other Acute Inpatient Hospital | Payer: Self-pay | Source: Intra-hospital | Attending: Physical Medicine & Rehabilitation

## 2017-09-11 ENCOUNTER — Inpatient Hospital Stay (HOSPITAL_COMMUNITY): Admission: AD | Admit: 2017-09-11 | Payer: Self-pay | Source: Ambulatory Visit | Admitting: Orthopedic Surgery

## 2017-09-11 ENCOUNTER — Inpatient Hospital Stay (HOSPITAL_COMMUNITY): Payer: Medicare Other | Admitting: Anesthesiology

## 2017-09-11 ENCOUNTER — Inpatient Hospital Stay (HOSPITAL_COMMUNITY): Payer: Medicare Other

## 2017-09-11 ENCOUNTER — Inpatient Hospital Stay (HOSPITAL_COMMUNITY)
Admission: RE | Admit: 2017-09-11 | Discharge: 2017-09-15 | DRG: 481 | Disposition: A | Discharge status: Rehabilitation Facility | Payer: Medicare Other | Source: Intra-hospital | Attending: Orthopedic Surgery | Admitting: Orthopedic Surgery

## 2017-09-11 ENCOUNTER — Encounter (HOSPITAL_COMMUNITY): Payer: Self-pay

## 2017-09-11 ENCOUNTER — Inpatient Hospital Stay (HOSPITAL_COMMUNITY): Admission: RE | Admit: 2017-09-11 | Payer: Medicare Other | Source: Ambulatory Visit | Admitting: Orthopedic Surgery

## 2017-09-11 DIAGNOSIS — Z833 Family history of diabetes mellitus: Secondary | ICD-10-CM | POA: Diagnosis not present

## 2017-09-11 DIAGNOSIS — C73 Malignant neoplasm of thyroid gland: Secondary | ICD-10-CM | POA: Diagnosis present

## 2017-09-11 DIAGNOSIS — C7951 Secondary malignant neoplasm of bone: Secondary | ICD-10-CM | POA: Diagnosis not present

## 2017-09-11 DIAGNOSIS — Z6832 Body mass index (BMI) 32.0-32.9, adult: Secondary | ICD-10-CM | POA: Diagnosis not present

## 2017-09-11 DIAGNOSIS — N319 Neuromuscular dysfunction of bladder, unspecified: Secondary | ICD-10-CM | POA: Diagnosis present

## 2017-09-11 DIAGNOSIS — T148XXA Other injury of unspecified body region, initial encounter: Secondary | ICD-10-CM | POA: Diagnosis not present

## 2017-09-11 DIAGNOSIS — F329 Major depressive disorder, single episode, unspecified: Secondary | ICD-10-CM | POA: Diagnosis not present

## 2017-09-11 DIAGNOSIS — E669 Obesity, unspecified: Secondary | ICD-10-CM | POA: Diagnosis present

## 2017-09-11 DIAGNOSIS — T380X5A Adverse effect of glucocorticoids and synthetic analogues, initial encounter: Secondary | ICD-10-CM | POA: Diagnosis not present

## 2017-09-11 DIAGNOSIS — C799 Secondary malignant neoplasm of unspecified site: Secondary | ICD-10-CM

## 2017-09-11 DIAGNOSIS — G9529 Other cord compression: Secondary | ICD-10-CM | POA: Diagnosis not present

## 2017-09-11 DIAGNOSIS — M4714 Other spondylosis with myelopathy, thoracic region: Secondary | ICD-10-CM | POA: Diagnosis present

## 2017-09-11 DIAGNOSIS — K5900 Constipation, unspecified: Secondary | ICD-10-CM | POA: Diagnosis not present

## 2017-09-11 DIAGNOSIS — Y92009 Unspecified place in unspecified non-institutional (private) residence as the place of occurrence of the external cause: Secondary | ICD-10-CM

## 2017-09-11 DIAGNOSIS — S025XXA Fracture of tooth (traumatic), initial encounter for closed fracture: Secondary | ICD-10-CM | POA: Diagnosis not present

## 2017-09-11 DIAGNOSIS — K592 Neurogenic bowel, not elsewhere classified: Secondary | ICD-10-CM | POA: Diagnosis not present

## 2017-09-11 DIAGNOSIS — W1830XA Fall on same level, unspecified, initial encounter: Secondary | ICD-10-CM | POA: Diagnosis present

## 2017-09-11 DIAGNOSIS — G959 Disease of spinal cord, unspecified: Secondary | ICD-10-CM | POA: Diagnosis not present

## 2017-09-11 DIAGNOSIS — S72392A Other fracture of shaft of left femur, initial encounter for closed fracture: Principal | ICD-10-CM | POA: Diagnosis present

## 2017-09-11 DIAGNOSIS — G8929 Other chronic pain: Secondary | ICD-10-CM | POA: Diagnosis not present

## 2017-09-11 DIAGNOSIS — S72402A Unspecified fracture of lower end of left femur, initial encounter for closed fracture: Secondary | ICD-10-CM | POA: Diagnosis not present

## 2017-09-11 DIAGNOSIS — R112 Nausea with vomiting, unspecified: Secondary | ICD-10-CM | POA: Diagnosis not present

## 2017-09-11 DIAGNOSIS — Z86718 Personal history of other venous thrombosis and embolism: Secondary | ICD-10-CM | POA: Diagnosis not present

## 2017-09-11 DIAGNOSIS — D61818 Other pancytopenia: Secondary | ICD-10-CM | POA: Diagnosis not present

## 2017-09-11 DIAGNOSIS — S72302S Unspecified fracture of shaft of left femur, sequela: Secondary | ICD-10-CM | POA: Diagnosis not present

## 2017-09-11 DIAGNOSIS — S72402S Unspecified fracture of lower end of left femur, sequela: Secondary | ICD-10-CM | POA: Diagnosis not present

## 2017-09-11 DIAGNOSIS — R739 Hyperglycemia, unspecified: Secondary | ICD-10-CM | POA: Diagnosis not present

## 2017-09-11 DIAGNOSIS — G8220 Paraplegia, unspecified: Secondary | ICD-10-CM | POA: Diagnosis present

## 2017-09-11 DIAGNOSIS — Z823 Family history of stroke: Secondary | ICD-10-CM | POA: Diagnosis not present

## 2017-09-11 DIAGNOSIS — E890 Postprocedural hypothyroidism: Secondary | ICD-10-CM | POA: Diagnosis not present

## 2017-09-11 DIAGNOSIS — Z86711 Personal history of pulmonary embolism: Secondary | ICD-10-CM | POA: Diagnosis not present

## 2017-09-11 DIAGNOSIS — Z4789 Encounter for other orthopedic aftercare: Secondary | ICD-10-CM | POA: Diagnosis not present

## 2017-09-11 DIAGNOSIS — S72302A Unspecified fracture of shaft of left femur, initial encounter for closed fracture: Secondary | ICD-10-CM | POA: Diagnosis not present

## 2017-09-11 DIAGNOSIS — I10 Essential (primary) hypertension: Secondary | ICD-10-CM | POA: Diagnosis present

## 2017-09-11 DIAGNOSIS — G9520 Unspecified cord compression: Secondary | ICD-10-CM | POA: Diagnosis not present

## 2017-09-11 DIAGNOSIS — Z8249 Family history of ischemic heart disease and other diseases of the circulatory system: Secondary | ICD-10-CM | POA: Diagnosis not present

## 2017-09-11 DIAGNOSIS — E8889 Other specified metabolic disorders: Secondary | ICD-10-CM | POA: Diagnosis present

## 2017-09-11 DIAGNOSIS — E099 Drug or chemical induced diabetes mellitus without complications: Secondary | ICD-10-CM | POA: Diagnosis not present

## 2017-09-11 DIAGNOSIS — N390 Urinary tract infection, site not specified: Secondary | ICD-10-CM | POA: Diagnosis not present

## 2017-09-11 DIAGNOSIS — G992 Myelopathy in diseases classified elsewhere: Secondary | ICD-10-CM | POA: Diagnosis present

## 2017-09-11 DIAGNOSIS — D62 Acute posthemorrhagic anemia: Secondary | ICD-10-CM | POA: Diagnosis not present

## 2017-09-11 DIAGNOSIS — S72332A Displaced oblique fracture of shaft of left femur, initial encounter for closed fracture: Secondary | ICD-10-CM | POA: Diagnosis not present

## 2017-09-11 DIAGNOSIS — M62838 Other muscle spasm: Secondary | ICD-10-CM | POA: Diagnosis not present

## 2017-09-11 DIAGNOSIS — E119 Type 2 diabetes mellitus without complications: Secondary | ICD-10-CM

## 2017-09-11 DIAGNOSIS — R109 Unspecified abdominal pain: Secondary | ICD-10-CM | POA: Diagnosis not present

## 2017-09-11 DIAGNOSIS — D702 Other drug-induced agranulocytosis: Secondary | ICD-10-CM | POA: Diagnosis not present

## 2017-09-11 DIAGNOSIS — G8221 Paraplegia, complete: Secondary | ICD-10-CM | POA: Diagnosis not present

## 2017-09-11 DIAGNOSIS — Z7901 Long term (current) use of anticoagulants: Secondary | ICD-10-CM | POA: Diagnosis not present

## 2017-09-11 DIAGNOSIS — K219 Gastro-esophageal reflux disease without esophagitis: Secondary | ICD-10-CM | POA: Diagnosis not present

## 2017-09-11 DIAGNOSIS — S72302D Unspecified fracture of shaft of left femur, subsequent encounter for closed fracture with routine healing: Secondary | ICD-10-CM | POA: Diagnosis not present

## 2017-09-11 DIAGNOSIS — D492 Neoplasm of unspecified behavior of bone, soft tissue, and skin: Secondary | ICD-10-CM | POA: Diagnosis present

## 2017-09-11 DIAGNOSIS — Z79899 Other long term (current) drug therapy: Secondary | ICD-10-CM | POA: Diagnosis not present

## 2017-09-11 DIAGNOSIS — Z981 Arthrodesis status: Secondary | ICD-10-CM | POA: Diagnosis not present

## 2017-09-11 DIAGNOSIS — D696 Thrombocytopenia, unspecified: Secondary | ICD-10-CM | POA: Diagnosis not present

## 2017-09-11 DIAGNOSIS — E039 Hypothyroidism, unspecified: Secondary | ICD-10-CM | POA: Diagnosis not present

## 2017-09-11 HISTORY — PX: FEMUR IM NAIL: SHX1597

## 2017-09-11 HISTORY — DX: Family history of other specified conditions: Z84.89

## 2017-09-11 LAB — CBC
HCT: 26.7 % — ABNORMAL LOW (ref 36.0–46.0)
Hemoglobin: 8.7 g/dL — ABNORMAL LOW (ref 12.0–15.0)
MCH: 30.6 pg (ref 26.0–34.0)
MCHC: 32.6 g/dL (ref 30.0–36.0)
MCV: 94 fL (ref 78.0–100.0)
Platelets: 163 10*3/uL (ref 150–400)
RBC: 2.84 MIL/uL — AB (ref 3.87–5.11)
RDW: 16.8 % — ABNORMAL HIGH (ref 11.5–15.5)
WBC: 11 10*3/uL — ABNORMAL HIGH (ref 4.0–10.5)

## 2017-09-11 LAB — PREPARE RBC (CROSSMATCH)

## 2017-09-11 LAB — OCCULT BLOOD X 1 CARD TO LAB, STOOL: Fecal Occult Bld: NEGATIVE

## 2017-09-11 SURGERY — INSERTION, INTRAMEDULLARY ROD, FEMUR, RETROGRADE
Anesthesia: General | Laterality: Left

## 2017-09-11 MED ORDER — MIDAZOLAM HCL 5 MG/5ML IJ SOLN
INTRAMUSCULAR | Status: DC | PRN
  Administered 2017-09-11: 2 mg via INTRAVENOUS

## 2017-09-11 MED ORDER — GLYCOPYRROLATE PF 0.2 MG/ML IJ SOSY
PREFILLED_SYRINGE | INTRAMUSCULAR | Status: DC | PRN
  Administered 2017-09-11: .1 mg via INTRAVENOUS

## 2017-09-11 MED ORDER — ACETAMINOPHEN 325 MG PO TABS: 650.0000 mg | ORAL_TABLET | ORAL | Status: DC | PRN

## 2017-09-11 MED ORDER — RIVAROXABAN 20 MG PO TABS
20.0000 mg | ORAL_TABLET | Freq: Every day | ORAL | Status: DC
  Administered 2017-09-12 – 2017-09-14 (×3): 20 mg via ORAL
  Filled 2017-09-11 (×4): qty 1

## 2017-09-11 MED ORDER — PROPOFOL 10 MG/ML IV BOLUS
INTRAVENOUS | Status: AC
  Filled 2017-09-11: qty 20

## 2017-09-11 MED ORDER — ROCURONIUM BROMIDE 10 MG/ML (PF) SYRINGE
PREFILLED_SYRINGE | INTRAVENOUS | Status: DC | PRN
  Administered 2017-09-11: 30 mg via INTRAVENOUS
  Administered 2017-09-11: 50 mg via INTRAVENOUS

## 2017-09-11 MED ORDER — ONDANSETRON HCL 4 MG PO TABS: 4.0000 mg | ORAL_TABLET | Freq: Four times a day (QID) | ORAL | Status: DC | PRN

## 2017-09-11 MED ORDER — SULFAMETHOXAZOLE-TRIMETHOPRIM 800-160 MG PO TABS
1.0000 | ORAL_TABLET | Freq: Two times a day (BID) | ORAL | Status: DC
  Administered 2017-09-11 – 2017-09-15 (×8): 1 via ORAL
  Filled 2017-09-11 (×8): qty 1

## 2017-09-11 MED ORDER — CEFAZOLIN SODIUM-DEXTROSE 2-3 GM-%(50ML) IV SOLR
INTRAVENOUS | Status: DC | PRN
  Administered 2017-09-11: 2 g via INTRAVENOUS

## 2017-09-11 MED ORDER — MORPHINE SULFATE (PF) 2 MG/ML IV SOLN: 0.5000 mg | INTRAVENOUS | Status: DC | PRN

## 2017-09-11 MED ORDER — FLUTICASONE PROPIONATE 50 MCG/ACT NA SUSP
2.0000 | Freq: Every day | NASAL | Status: DC
  Administered 2017-09-12 – 2017-09-14 (×3): 2 via NASAL
  Filled 2017-09-11: qty 16

## 2017-09-11 MED ORDER — LIDOCAINE 2% (20 MG/ML) 5 ML SYRINGE
INTRAMUSCULAR | Status: DC | PRN
  Administered 2017-09-11: 60 mg via INTRAVENOUS

## 2017-09-11 MED ORDER — PHENYLEPHRINE 40 MCG/ML (10ML) SYRINGE FOR IV PUSH (FOR BLOOD PRESSURE SUPPORT)
PREFILLED_SYRINGE | INTRAVENOUS | Status: AC
  Filled 2017-09-11: qty 20

## 2017-09-11 MED ORDER — DIAZEPAM 5 MG PO TABS: 5.0000 mg | ORAL_TABLET | Freq: Four times a day (QID) | ORAL | 0 refills | Status: DC | PRN

## 2017-09-11 MED ORDER — PHENYLEPHRINE 40 MCG/ML (10ML) SYRINGE FOR IV PUSH (FOR BLOOD PRESSURE SUPPORT)
PREFILLED_SYRINGE | INTRAVENOUS | Status: DC | PRN
  Administered 2017-09-11: 80 ug via INTRAVENOUS

## 2017-09-11 MED ORDER — ONDANSETRON HCL 4 MG PO TABS
4.0000 mg | ORAL_TABLET | Freq: Four times a day (QID) | ORAL | Status: DC
  Administered 2017-09-11 – 2017-09-15 (×12): 4 mg via ORAL
  Filled 2017-09-11 (×14): qty 1

## 2017-09-11 MED ORDER — PHENYLEPHRINE HCL 10 MG/ML IJ SOLN
INTRAVENOUS | Status: DC | PRN
  Administered 2017-09-11: 30 ug/min via INTRAVENOUS

## 2017-09-11 MED ORDER — BISACODYL 10 MG RE SUPP: 10.0000 mg | Freq: Every day | RECTAL | 0 refills | Status: DC

## 2017-09-11 MED ORDER — FENTANYL CITRATE (PF) 250 MCG/5ML IJ SOLN
INTRAMUSCULAR | Status: AC
  Filled 2017-09-11: qty 5

## 2017-09-11 MED ORDER — DEXAMETHASONE 4 MG PO TABS
4.0000 mg | ORAL_TABLET | Freq: Four times a day (QID) | ORAL | Status: DC
  Administered 2017-09-11 – 2017-09-15 (×16): 4 mg via ORAL
  Filled 2017-09-11 (×16): qty 1

## 2017-09-11 MED ORDER — SODIUM CHLORIDE 0.9 % IJ SOLN
INTRAMUSCULAR | Status: AC
  Filled 2017-09-11: qty 10

## 2017-09-11 MED ORDER — LISINOPRIL 40 MG PO TABS
40.0000 mg | ORAL_TABLET | Freq: Every day | ORAL | Status: DC
  Administered 2017-09-12 – 2017-09-14 (×2): 40 mg via ORAL
  Filled 2017-09-11 (×4): qty 1

## 2017-09-11 MED ORDER — SUGAMMADEX SODIUM 200 MG/2ML IV SOLN
INTRAVENOUS | Status: AC
  Filled 2017-09-11: qty 2

## 2017-09-11 MED ORDER — ONDANSETRON HCL 4 MG/2ML IJ SOLN
INTRAMUSCULAR | Status: AC
  Filled 2017-09-11: qty 2

## 2017-09-11 MED ORDER — DEXAMETHASONE SODIUM PHOSPHATE 10 MG/ML IJ SOLN
INTRAMUSCULAR | Status: DC | PRN
  Administered 2017-09-11: 10 mg via INTRAVENOUS

## 2017-09-11 MED ORDER — LEVOTHYROXINE SODIUM 25 MCG PO TABS
137.0000 ug | ORAL_TABLET | Freq: Every day | ORAL | Status: DC
  Administered 2017-09-12 – 2017-09-15 (×4): 137 ug via ORAL
  Filled 2017-09-11 (×4): qty 1

## 2017-09-11 MED ORDER — MIDAZOLAM HCL 2 MG/2ML IJ SOLN
INTRAMUSCULAR | Status: AC
  Filled 2017-09-11: qty 2

## 2017-09-11 MED ORDER — HYDROCODONE-ACETAMINOPHEN 5-325 MG PO TABS
1.0000 | ORAL_TABLET | ORAL | Status: DC | PRN
  Administered 2017-09-12 – 2017-09-14 (×5): 1 via ORAL
  Filled 2017-09-11: qty 1
  Filled 2017-09-11: qty 2
  Filled 2017-09-11 (×4): qty 1

## 2017-09-11 MED ORDER — ONDANSETRON HCL 4 MG/2ML IJ SOLN
INTRAMUSCULAR | Status: DC | PRN
  Administered 2017-09-11: 4 mg via INTRAVENOUS

## 2017-09-11 MED ORDER — SUCCINYLCHOLINE CHLORIDE 20 MG/ML IJ SOLN
INTRAMUSCULAR | Status: AC
  Filled 2017-09-11: qty 1

## 2017-09-11 MED ORDER — DIAZEPAM 5 MG PO TABS: 5.0000 mg | ORAL_TABLET | Freq: Four times a day (QID) | ORAL | Status: DC | PRN

## 2017-09-11 MED ORDER — ONDANSETRON HCL 4 MG/2ML IJ SOLN
4.0000 mg | Freq: Four times a day (QID) | INTRAMUSCULAR | Status: DC | PRN
  Administered 2017-09-15: 4 mg via INTRAVENOUS
  Filled 2017-09-11: qty 2

## 2017-09-11 MED ORDER — PROPOFOL 10 MG/ML IV BOLUS
INTRAVENOUS | Status: DC | PRN
  Administered 2017-09-11: 100 mg via INTRAVENOUS

## 2017-09-11 MED ORDER — HYDROCODONE-ACETAMINOPHEN 5-325 MG PO TABS: 1.0000 | ORAL_TABLET | ORAL | 0 refills | Status: DC | PRN

## 2017-09-11 MED ORDER — SULFAMETHOXAZOLE-TRIMETHOPRIM 800-160 MG PO TABS: 1.0000 | ORAL_TABLET | Freq: Two times a day (BID) | ORAL | Status: DC

## 2017-09-11 MED ORDER — METOCLOPRAMIDE HCL 5 MG PO TABS: 5.0000 mg | ORAL_TABLET | Freq: Three times a day (TID) | ORAL | Status: DC | PRN

## 2017-09-11 MED ORDER — FENTANYL CITRATE (PF) 250 MCG/5ML IJ SOLN
INTRAMUSCULAR | Status: DC | PRN
  Administered 2017-09-11: 25 ug via INTRAVENOUS
  Administered 2017-09-11: 100 ug via INTRAVENOUS

## 2017-09-11 MED ORDER — DEXAMETHASONE 4 MG PO TABS: 4.0000 mg | ORAL_TABLET | Freq: Four times a day (QID) | ORAL | Status: DC

## 2017-09-11 MED ORDER — CEFAZOLIN SODIUM-DEXTROSE 1-4 GM/50ML-% IV SOLN
1.0000 g | Freq: Four times a day (QID) | INTRAVENOUS | Status: AC
Start: 2017-09-11 — End: 2017-09-12
  Administered 2017-09-11 – 2017-09-12 (×3): 1 g via INTRAVENOUS
  Filled 2017-09-11 (×4): qty 50

## 2017-09-11 MED ORDER — BACLOFEN 10 MG PO TABS
20.0000 mg | ORAL_TABLET | Freq: Three times a day (TID) | ORAL | Status: DC
  Administered 2017-09-11 – 2017-09-15 (×11): 20 mg via ORAL
  Filled 2017-09-11 (×13): qty 2

## 2017-09-11 MED ORDER — LIDOCAINE 2% (20 MG/ML) 5 ML SYRINGE
INTRAMUSCULAR | Status: AC
  Filled 2017-09-11: qty 10

## 2017-09-11 MED ORDER — DOCUSATE SODIUM 100 MG PO CAPS
100.0000 mg | ORAL_CAPSULE | Freq: Two times a day (BID) | ORAL | Status: DC
  Filled 2017-09-11 (×8): qty 1

## 2017-09-11 MED ORDER — ACETAMINOPHEN 325 MG PO TABS
325.0000 mg | ORAL_TABLET | Freq: Four times a day (QID) | ORAL | Status: DC | PRN
  Administered 2017-09-12: 650 mg via ORAL
  Filled 2017-09-11 (×2): qty 2

## 2017-09-11 MED ORDER — SUGAMMADEX SODIUM 200 MG/2ML IV SOLN
INTRAVENOUS | Status: DC | PRN
  Administered 2017-09-11: 200 mg via INTRAVENOUS

## 2017-09-11 MED ORDER — METOCLOPRAMIDE HCL 5 MG/ML IJ SOLN: 5.0000 mg | Freq: Three times a day (TID) | INTRAMUSCULAR | Status: DC | PRN

## 2017-09-11 MED ORDER — PANTOPRAZOLE SODIUM 40 MG PO TBEC
40.0000 mg | DELAYED_RELEASE_TABLET | Freq: Every day | ORAL | Status: DC
  Administered 2017-09-11 – 2017-09-15 (×5): 40 mg via ORAL
  Filled 2017-09-11 (×5): qty 1

## 2017-09-11 MED ORDER — TRAZODONE HCL 50 MG PO TABS: 50.0000 mg | ORAL_TABLET | Freq: Every evening | ORAL | Status: DC | PRN

## 2017-09-11 MED ORDER — SENNA 8.6 MG PO TABS
2.0000 | ORAL_TABLET | Freq: Every day | ORAL | Status: DC
  Administered 2017-09-12 – 2017-09-15 (×4): 17.2 mg via ORAL
  Filled 2017-09-11 (×4): qty 2

## 2017-09-11 MED ORDER — ROCURONIUM BROMIDE 50 MG/5ML IV SOLN
INTRAVENOUS | Status: AC
Start: 2017-09-11 — End: ?
  Filled 2017-09-11: qty 2

## 2017-09-11 MED ORDER — HYDROCODONE-ACETAMINOPHEN 7.5-325 MG PO TABS
1.0000 | ORAL_TABLET | ORAL | Status: DC | PRN
  Administered 2017-09-13 – 2017-09-15 (×3): 1 via ORAL
  Filled 2017-09-11: qty 2
  Filled 2017-09-11 (×3): qty 1

## 2017-09-11 MED ORDER — POTASSIUM CHLORIDE IN NACL 20-0.9 MEQ/L-% IV SOLN
INTRAVENOUS | Status: DC
  Administered 2017-09-11 – 2017-09-12 (×2): via INTRAVENOUS
  Filled 2017-09-11 (×2): qty 1000

## 2017-09-11 MED ORDER — FUROSEMIDE 20 MG PO TABS: 20.0000 mg | ORAL_TABLET | Freq: Every day | ORAL | Status: DC | PRN

## 2017-09-11 MED ORDER — TRAZODONE HCL 50 MG PO TABS
50.0000 mg | ORAL_TABLET | Freq: Every evening | ORAL | Status: DC | PRN
Start: 2017-09-11 — End: 2017-09-15

## 2017-09-11 MED ORDER — DEXAMETHASONE SODIUM PHOSPHATE 10 MG/ML IJ SOLN
INTRAMUSCULAR | Status: AC
  Filled 2017-09-11: qty 1

## 2017-09-11 MED ORDER — SERTRALINE HCL 25 MG PO TABS
25.0000 mg | ORAL_TABLET | Freq: Every day | ORAL | Status: DC
  Administered 2017-09-11 – 2017-09-15 (×5): 25 mg via ORAL
  Filled 2017-09-11 (×5): qty 1

## 2017-09-11 MED ORDER — 0.9 % SODIUM CHLORIDE (POUR BTL) OPTIME
TOPICAL | Status: DC | PRN
  Administered 2017-09-11: 1000 mL

## 2017-09-11 MED ORDER — LACTATED RINGERS IV SOLN: INTRAVENOUS | Status: DC

## 2017-09-11 SURGICAL SUPPLY — 69 items
BANDAGE ACE 4X5 VEL STRL LF (GAUZE/BANDAGES/DRESSINGS) ×3 IMPLANT
BANDAGE ACE 6X5 VEL STRL LF (GAUZE/BANDAGES/DRESSINGS) ×3 IMPLANT
BANDAGE ESMARK 6X9 LF (GAUZE/BANDAGES/DRESSINGS) IMPLANT
BIT DRILL CALIBRATED 4.3MMX365 (DRILL) ×1 IMPLANT
BIT DRILL CROWE PNT TWST 4.5MM (DRILL) ×1 IMPLANT
BNDG COHESIVE 4X5 TAN STRL (GAUZE/BANDAGES/DRESSINGS) ×3 IMPLANT
BNDG ESMARK 6X9 LF (GAUZE/BANDAGES/DRESSINGS)
BNDG GAUZE ELAST 4 BULKY (GAUZE/BANDAGES/DRESSINGS) ×3 IMPLANT
BRUSH SCRUB SURG 4.25 DISP (MISCELLANEOUS) ×6 IMPLANT
COVER MAYO STAND STRL (DRAPES) ×3 IMPLANT
COVER SURGICAL LIGHT HANDLE (MISCELLANEOUS) ×3 IMPLANT
DRAPE C-ARM 42X72 X-RAY (DRAPES) ×3 IMPLANT
DRAPE C-ARMOR (DRAPES) ×3 IMPLANT
DRAPE IMP U-DRAPE 54X76 (DRAPES) ×3 IMPLANT
DRAPE INCISE IOBAN 66X45 STRL (DRAPES) ×3 IMPLANT
DRAPE ORTHO SPLIT 77X108 STRL (DRAPES) ×4
DRAPE SURG ORHT 6 SPLT 77X108 (DRAPES) ×2 IMPLANT
DRAPE U-SHAPE 47X51 STRL (DRAPES) ×3 IMPLANT
DRILL CALIBRATED 4.3MMX365 (DRILL) ×3
DRILL CROWE POINT TWIST 4.5MM (DRILL) ×3
DRSG MEPILEX BORDER 4X4 (GAUZE/BANDAGES/DRESSINGS) ×6 IMPLANT
DRSG MEPITEL 3X4 ME34 (GAUZE/BANDAGES/DRESSINGS) ×3 IMPLANT
DRSG PAD ABDOMINAL 8X10 ST (GAUZE/BANDAGES/DRESSINGS) ×6 IMPLANT
ELECT REM PT RETURN 9FT ADLT (ELECTROSURGICAL) ×3
ELECTRODE REM PT RTRN 9FT ADLT (ELECTROSURGICAL) ×1 IMPLANT
EVACUATOR 1/8 PVC DRAIN (DRAIN) IMPLANT
GAUZE SPONGE 4X4 12PLY STRL (GAUZE/BANDAGES/DRESSINGS) ×3 IMPLANT
GAUZE XEROFORM 1X8 LF (GAUZE/BANDAGES/DRESSINGS) ×3 IMPLANT
GLOVE BIO SURGEON STRL SZ7.5 (GLOVE) ×3 IMPLANT
GLOVE BIO SURGEON STRL SZ8 (GLOVE) ×3 IMPLANT
GLOVE BIOGEL PI IND STRL 7.5 (GLOVE) ×1 IMPLANT
GLOVE BIOGEL PI IND STRL 8 (GLOVE) ×1 IMPLANT
GLOVE BIOGEL PI INDICATOR 7.5 (GLOVE) ×2
GLOVE BIOGEL PI INDICATOR 8 (GLOVE) ×2
GOWN STRL REUS W/ TWL LRG LVL3 (GOWN DISPOSABLE) ×2 IMPLANT
GOWN STRL REUS W/ TWL XL LVL3 (GOWN DISPOSABLE) ×1 IMPLANT
GOWN STRL REUS W/TWL LRG LVL3 (GOWN DISPOSABLE) ×4
GOWN STRL REUS W/TWL XL LVL3 (GOWN DISPOSABLE) ×2
GUIDEWIRE BEAD TIP (WIRE) ×3 IMPLANT
KIT BASIN OR (CUSTOM PROCEDURE TRAY) ×3 IMPLANT
KIT TURNOVER KIT B (KITS) ×3 IMPLANT
NAIL FEM RETRO 10.5X420 (Nail) ×3 IMPLANT
PACK ORTHO EXTREMITY (CUSTOM PROCEDURE TRAY) ×3 IMPLANT
PACK UNIVERSAL I (CUSTOM PROCEDURE TRAY) ×3 IMPLANT
PAD ARMBOARD 7.5X6 YLW CONV (MISCELLANEOUS) ×6 IMPLANT
PAD CAST 4YDX4 CTTN HI CHSV (CAST SUPPLIES) ×1 IMPLANT
PADDING CAST COTTON 4X4 STRL (CAST SUPPLIES) ×2
REAMER ONE STEP 12.2MM (BIT) ×3 IMPLANT
SCREW CORT TI DBL LEAD 5X32 (Screw) ×3 IMPLANT
SCREW CORT TI DBL LEAD 5X34 (Screw) ×3 IMPLANT
SCREW CORT TI DBL LEAD 5X38 (Screw) ×3 IMPLANT
SCREW CORT TI DBL LEAD 5X65 (Screw) ×3 IMPLANT
SCREW CORT TI DBL LEAD 5X70 (Screw) ×3 IMPLANT
SCREW CORT TI DBL LEAD 5X80 (Screw) ×3 IMPLANT
SPONGE LAP 18X18 X RAY DECT (DISPOSABLE) ×3 IMPLANT
STAPLER VISISTAT 35W (STAPLE) ×3 IMPLANT
STOCKINETTE IMPERVIOUS LG (DRAPES) ×3 IMPLANT
SUT ETHILON 3 0 PS 1 (SUTURE) ×6 IMPLANT
SUT PROLENE 3 0 PS 2 (SUTURE) IMPLANT
SUT VIC AB 0 CT1 27 (SUTURE) ×2
SUT VIC AB 0 CT1 27XBRD ANBCTR (SUTURE) ×1 IMPLANT
SUT VIC AB 2-0 CT1 27 (SUTURE) ×2
SUT VIC AB 2-0 CT1 TAPERPNT 27 (SUTURE) ×1 IMPLANT
SUT VIC AB 2-0 CT3 27 (SUTURE) IMPLANT
TOWEL OR 17X24 6PK STRL BLUE (TOWEL DISPOSABLE) ×3 IMPLANT
TOWEL OR 17X26 10 PK STRL BLUE (TOWEL DISPOSABLE) ×6 IMPLANT
TUBE CONNECTING 12'X1/4 (SUCTIONS) ×1
TUBE CONNECTING 12X1/4 (SUCTIONS) ×2 IMPLANT
YANKAUER SUCT BULB TIP NO VENT (SUCTIONS) ×3 IMPLANT

## 2017-09-11 NOTE — Discharge Summary (Signed)
NAME: Sarah [name], Sarah [name] MEDICAL RECORD DT:[id number] ACCOUNT [id number] DATE OF BIRTH:1965-05-13 FACILITY: [facility] LOCATION: [facility] PHYSICIAN:ZACHARY Naaman Plummer, MD  DISCHARGE SUMMARY  DATE OF DISCHARGE:  09/11/2017  ADMIT DATE:  09/08/2017  DISCHARGE DATE:  09/11/2017  DISCHARGE DIAGNOSES: 1.  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 2.  Distal left femoral fracture. 3.  History of deep venous thrombosis, pulmonary emboli. 4.  Pain management. 5.  Mood. 6.  Acute blood loss anemia. 7.  Hypertension. 8.  Hypothyroidism.   9.  Constipation.   10.  Escherichia coli urinary tract infection. 11.  Neurogenic bowel and bladder.    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  Lives with sister and brother-in-law.  Previous back surgery x2 thoracic T3  in 2015 and 2017 receiving inpatient rehabilitation services.  Recent admission 2-1/2 weeks ago for nausea and vomiting later discharged to home.  Presented 09/02/2017 recent fall and increasing leg weakness.  Initially seen by oncology for planned MRI;  however, she could not make the appointment due to lower extremity weakness.  She did have some urinary incontinence.  MRI and imaging revealed tumor recurrence at T3 spinal cord compression, underwent posterior tumor resection per Dr. Christella Noa.   Radiation oncology followup plan for radiation as outpatient.  Decadron protocol is indicated.  The patient had been maintained on Arixtra for history of DVT, pulmonary emboli and plan to resume Xarelto.  Acute on chronic anemia 10.2.  E. coli UTI  maintained on antibiotic therapy.  The patient was admitted for comprehensive rehabilitation program.  PAST MEDICAL HISTORY:  See discharge diagnoses.  SOCIAL HISTORY:  Lives with  sister and brother-in-law.   FUNCTIONAL STATUS:  Upon admission to rehab services and was +2 physical assist sit to stand, +2 physical assist supine to sit, max total assist with activities of daily living.  PHYSICAL EXAMINATION: VITAL SIGNS:  Blood pressure 117/76, pulse 67, temperature 97, respirations 18. GENERAL:  Alert female in no acute distress.  EOMs intact. NECK:  Supple, nontender.  No JVD. CARDIOVASCULAR:  Rate controlled. ABDOMEN:  Soft, nontender, good bowel sounds. LUNGS:  Clear to auscultation without wheeze. EXTREMITIES:  Left knee with mild crepitus, no appreciable pain with range of motion.  She did have some edema to the left knee.  REHABILITATION HOSPITAL COURSE:  The patient was admitted to inpatient rehabilitation services.  Therapies initiated on a 3-hour daily basis, consisting of physical therapy, occupational therapy and rehabilitation nursing.  The following issues were  addressed during patient's rehabilitation stay:  Pertaining to the patient's metastatic Hurthle cancer, thoracic spine, recurrent T3 tumor, underwent resection 09/03/2017.  Would follow up with neurosurgery.  The patient initially on Arixtra for DVT  prophylaxis.  History of pulmonary emboli during hospital stay.  She was to be converted to Xarelto, which was initially started; however, followup labs showed hemoglobin of 6.2 confirmed at 6.5.  She was transfused.  Xarelto was held.  CT of the  thoracic, lumbar spine without bleed.  CT pelvis unremarkable.  No active signs of blood loss.  Xarelto remained on hold.  Vascular studies of lower extremities, right lower extremity venous duplex preliminary negative for DVT.  Pain management with the  use of Celebrex, baclofen and hydrocodone with Valium as needed for spasms.  Blood pressure is controlled and monitored.  Noted persistent left knee pain.  Noted effusion.  X-rays completed that showed displaced oblique distal left femoral metadiaphyseal  fracture.   With these findings, the orthopedic services consulted for possible need for surgical intervention.  Follow up with Dr. Marcene Duos of orthopedic services for planned surgical intervention and patient was to be discharged to acute care  services for ongoing care 09/11/2017 anticipated date of surgery.  In regards to the patient's neurogenic bowel and bladder, Foley catheter tube remained in place.  Therapies had been limited due to patient's anemia and findings of recent fracture.  The  patient had been on bed rest.  All medication changes made as per orthopedic services at time of discharge for surgical intervention 09/11/2017.  TN/NUANCE D:09/11/2017 T:09/11/2017 JOB:[id number]

## 2017-09-11 NOTE — Progress Notes (Signed)
[name] PHYSICAL MEDICINE & REHABILITATION     PROGRESS NOTE    Subjective/Complaints: Pt in reasonably good spirits. Denies pain.   ROS: Patient denies fever, rash, sore throat, blurred vision, nausea, vomiting, diarrhea, cough, shortness of breath or chest pain, joint or back pain, headache, or mood change.   Objective:  Dg Knee 1-2 Views Left  Result Date: 09/10/2017 CLINICAL DATA:  Pain LEFT knee, recent fall on 09/02/2017 EXAM: LEFT KNEE - 1-2 VIEW COMPARISON:  None FINDINGS: Osseous demineralization. Joint spaces preserved. Oblique fracture of the distal LEFT femoral metadiaphysis mildly displaced medially and posteriorly. No additional fracture, dislocation or bone destruction. No knee joint effusion. IMPRESSION: Displaced oblique distal LEFT femoral metadiaphyseal fracture. Findings called to [facility] on [facility] on 09/10/2017 at 1317 hours. Electronically Signed   By: Lavonia Dana M.D.   On: 09/10/2017 13:17   Ct Thoracic Spine Wo Contrast  Result Date: 09/09/2017 CLINICAL DATA:  Neck hematoma, traumatic. Bone neoplasm. Check for local recurrence EXAM: CT THORACIC SPINE WITHOUT CONTRAST TECHNIQUE: Multidetector CT images of the thoracic were obtained using the standard protocol without intravenous contrast. COMPARISON:  Thoracic MRI 09/02/2017.  Thoracic spine CT 09/03/2017 FINDINGS: Alignment: Stable alignment with mild exaggerated thoracic kyphosis. Vertebrae: There is known malignant erosion of the T3, T2, and T4 bodies with chronic T3 collapse. Status post remote laminectomy with more recent reopening for recurrent tumor excision. There is posterior hardware spanning T1-T5, which is well seated. No progressive erosion or interval fracture seen. Paraspinal and other soft tissues: High-density material within the incision, both subcutaneous and deep, measuring up to 16 mm thickness, 8 cm craniocaudal, and 7 cm in depth. The fluid is high-density compatible with blood products.  At the level of the laminectomy, mass effect on the thecal sac would not be detectable by this technique. Degree of residual tumor is also not well established by this technique. Disc levels: Diffuse spondylosis with bridging osteophytes from T4 to T12. There are small calcified disc protrusions at T7-8 and T8-9. IMPRESSION: Interval T3 spinal canal tumor debulking with hemolymphatic material in the subcutaneous and deep incision measuring 40-50 cc. No detected change in osseous structures or posterior hardware. Electronically Signed   By: Monte Fantasia M.D.   On: 09/09/2017 12:51   Ct Lumbar Spine Wo Contrast  Result Date: 09/09/2017 CLINICAL DATA:  Hematoma, neck.  Drop in hematocrit. EXAM: CT LUMBAR SPINE WITHOUT CONTRAST TECHNIQUE: Multidetector CT imaging of the lumbar spine was performed without intravenous contrast administration. Multiplanar CT image reconstructions were also generated. COMPARISON:  09/02/2017 MRI FINDINGS: Segmentation: 5 lumbar type vertebral bodies. Alignment: Mild levoscoliosis. Vertebrae: No evidence of fracture, discitis, or aggressive bone lesion. Paraspinal and other soft tissues: Negative for hematoma. Disc levels: Congenitally narrow spinal canal with bulky spondylitic spurring and generalized degenerative facet hypertrophy throughout the lumbar levels. The spinal canal and foramina are better assessed by recent MRI. IMPRESSION: 1. Negative for hematoma. 2. Bulky spondylosis and facet arthropathy throughout the lumbar spine, with spinal stenosis exacerbated by short pedicles. See MRI from 1 week prior. Electronically Signed   By: Monte Fantasia M.D.   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 EXAM: CT PELVIS WITHOUT CONTRAST TECHNIQUE: Multidetector CT imaging of the pelvis was performed following the standard protocol without intravenous contrast.  COMPARISON:  Lumbar spine CT today reported separately. CT Abdomen and Pelvis 08/09/2017. FINDINGS:  Urinary Tract: Foley catheter in place. The urinary bladder is decompressed. The visible ureters are decompressed and unremarkable. Bowel: Retained stool in the rectosigmoid colon similar to the CT last month. Visible large and small bowel loops are nondilated. Vascular/Lymphatic: Vascular patency is not evaluated in the absence of IV contrast. No lymphadenopathy. Reproductive:  Negative. Other: No pelvic free fluid. There is increased nonspecific presacral stranding. See series 4, image 32. Since May there is new widespread nonspecific subcutaneous fat stranding throughout the lateral abdominal wall and proximal lower extremities bilaterally, but greater on the left at the level of the proximal thigh as seen on series 4, image 60. no soft tissue fluid collection in these regions. Musculoskeletal: Bulky degenerative osteophytosis in the visible lower lumbar spine and about the hips, greater on the right. Bone mineralization remains within normal limits. There is no acute, destructive, or suspicious osseous lesion identified. IMPRESSION: 1. Unchanged pelvic osseous structures since May with no acute or suspicious osseous lesion identified. 2. Nonspecific bilateral lateral abdominal wall, presacral, flank, and proximal thigh subcutaneous edema/stranding. The left proximal thigh is asymmetrically more affected as seen on series 4, image 60. Query cellulitis or anasarca. Electronically Signed   By: Genevie Ann M.D.   On: 09/09/2017 21:17   Dg Femur Port Min 2 Views Left  Result Date: 09/10/2017 CLINICAL DATA:  52 y/o  F; left femur fracture. EXAM: LEFT FEMUR PORTABLE 2 VIEWS COMPARISON:  09/10/2017 left knee radiographs FINDINGS: Acute oblique extra-articular fracture of the distal left femur metadiaphysis with 1/2 shaft's width lateral displacement of the shaft relative to distal component and mild dorsal apex  angulation. Knee joint is maintained. No knee joint effusion. Bones are diffusely demineralized. IMPRESSION: Acute oblique extra-articular fracture of the distal left femur metadiaphysis with 1/2 shaft's width lateral displacement of the shaft relative to distal component and mild dorsal apex angulation. Electronically Signed   By: Kristine Garbe M.D.   On: 09/10/2017 22:37   Recent Labs    09/10/17 0523 09/11/17 0640  WBC 12.3* 11.0*  HGB 8.7* 8.7*  HCT 26.8* 26.7*  PLT 169 163   Recent Labs    09/09/17 0619  NA 138  K 4.3  CL 109  GLUCOSE 172*  BUN 26*  CREATININE 1.00  CALCIUM 7.2*   CBG (last 3)  No results for input(s): GLUCAP in the last 72 hours.  Wt Readings from Last 3 Encounters:  09/09/17 105.3 kg (232 lb 2.3 oz)  09/07/17 105.6 kg (232 lb 12.9 oz)  08/09/17 96 kg (211 lb 10.3 oz)     Intake/Output Summary (Last 24 hours) at 09/11/2017 0828 Last data filed at 09/11/2017 0657 Gross per 24 hour  Intake -  Output 2700 ml  Net -2700 ml    Vital Signs: Blood pressure 121/74, pulse 67, temperature 98.6 F (37 C), temperature source Oral, resp. rate 18, height 5\' 11"  (1.803 m), weight 105.3 kg (232 lb 2.3 oz), last menstrual period 10/22/2016, SpO2 100 %. Physical Exam:  Constitutional: No distress . Vital signs reviewed. HEENT: EOMI, oral membranes moist Neck: supple Cardiovascular: RRR without murmur. No JVD    Respiratory: CTA Bilaterally without wheezes or rales. Normal effort    GI: BS +, non-tender, non-distended  Musculoskeletal: She exhibitspersistent left knee swelling .   Neurological: UE motor 5/5. LE 0/5 HF, KE, ADF/PF. ?1/2 sensation below waist. No resting tone--motor exam stable Skin: She isnot diaphoretic. Multiple abrasions and lacs around left knee/lower leg. Back incision intact Psychiatric: pleasant  and cooperative.     Assessment/Plan: 1. Functional deficits secondary to thoracic myelopathy which require 3+ hours per  day of interdisciplinary therapy in a comprehensive inpatient rehab setting. Physiatrist is providing close team supervision and 24 hour management of active medical problems listed below. Physiatrist and rehab team continue to assess barriers to discharge/monitor patient progress toward functional and medical goals.  Function:  Bathing Bathing position   Position: Bed  Bathing parts Body parts bathed by patient: Right arm, Left arm, Chest, Abdomen Body parts bathed by helper: Front perineal area, Buttocks, Right upper leg, Left upper leg, Right lower leg, Left lower leg, Back  Bathing assist Assist Level: Touching or steadying assistance(Pt > 75%)      Upper Body Dressing/Undressing Upper body dressing   What is the patient wearing?: Pull over shirt/dress     Pull over shirt/dress - Perfomed by patient: Thread/unthread right sleeve, Thread/unthread left sleeve, Put head through opening Pull over shirt/dress - Perfomed by helper: Pull shirt over trunk        Upper body assist Assist Level: Touching or steadying assistance(Pt > 75%)      Lower Body Dressing/Undressing Lower body dressing   What is the patient wearing?: Pants, Ted Hose, Shoes       Pants- Performed by helper: Thread/unthread right pants leg, Thread/unthread left pants leg, Pull pants up/down           Shoes - Performed by helper: Don/doff right shoe, Don/doff left shoe, Fasten right, Fasten left       TED Hose - Performed by helper: Don/doff right TED hose, Don/doff left TED hose  Lower body assist Assist for lower body dressing: Touching or steadying assistance (Pt > 75%), 2 Helpers      Naval architect activity did not occur: No continent bowel/bladder event        Toileting assist     Transfers Chair/bed transfer Chair/bed transfer activity did not occur: Safety/medical concerns Chair/bed transfer method: Lateral scoot Chair/bed transfer assist level: 2 helpers(+2 for safety -  pt mod A) Chair/bed transfer assistive device: Sliding board     Locomotion Ambulation Ambulation activity did not occur: Safety/medical concerns         Wheelchair Wheelchair activity did not occur: Safety/medical concerns Type: Manual      Cognition Comprehension Comprehension assist level: Understands complex 90% of the time/cues 10% of the time  Expression Expression assist level: Expresses complex ideas: With no assist  Social Interaction Social Interaction assist level: Interacts appropriately with others with medication or extra time (anti-anxiety, antidepressant).  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 Status post recurrence of T3 spinal tumor resection 09/03/2017 -therapy as tolerated today 2. DVT Prophylaxis/Anticoagulation: Xarelto resumed and now held 3. Pain Management:Celebrex 200 mg every 12 hours, baclofen 20 mg 3 times daily, hydrocodone as needed as well as Valium for spasms 4. Mood:Zoloft 25 mg daily. Provide emotional support as needed.  -in good spirits at present 5. Neuropsych: This patientiscapable of making decisions on herown behalf. 6. Skin/Wound Care:Routine skin checks 7. Fluids/Electrolytes/Nutrition: I personally reviewed the patient's labs today.   8.Acute on chronic anemia. CT of T-L spine without bleed  -s/p 2u PRBC,  -hgb 8.7   -no signs of active blood loss  -continue to follow serially 9.Hypertension. Lisinopril 40 mg daily  10.Hypothyroidism. Synthroid 11.Constipation. Laxative assistance, adjust to home regimen 12.E. coli UTI.   Bactrim 13. Neurogenic bowel and bladder: voiding trial, bowel program 14. Distal left femur fracture: for nail  fixation today by ortho?   LOS (Days) [place] EVALUATION WAS PERFORMED  [name], MD 09/11/2017 8:28 AM

## 2017-09-11 NOTE — Consult Note (Signed)
I have seen and evaluated Mrs. Sarah [name] at the request of Dr. Marlou Sa and recommended IM nailing for repair of her left femoral shaft fracture.  LLE with significant swelling, some chronic wounds and ulcerations, but little sensation or motion. DP 2+  I discussed with the patient the risks and benefits of surgery, including the possibility of infection, nerve injury, vessel injury, wound breakdown, arthritis, symptomatic hardware, DVT/ PE, loss of motion, malunion, nonunion, and need for further surgery among others.  We also specifically discussed the elevated risk of thromboembolic disease.  She acknowledged these risks and wished to proceed.  Sarah [place], MD [facility], [facility] [phone]

## 2017-09-11 NOTE — Progress Notes (Signed)
ANTICOAGULATION CONSULT NOTE - Initial Consult  Pharmacy Consult for Xarelto Indication: pulmonary embolus  Allergies  Allergen Reactions  . Bee Venom Anaphylaxis  . Keflex [Cephalexin] Nausea And Vomiting  . Tramadol Nausea And Vomiting  . Gabapentin Nausea And Vomiting  . Heparin Other (See Comments)    Weak positive platelets induced antibodies, SRA negative--2015  . Hydrochlorothiazide Nausea And Vomiting  . Hydrocodone Nausea And Vomiting  . Oxycodone-Acetaminophen Nausea And Vomiting    Patient Measurements: Heparin Dosing Weight:   Vital Signs: Temp: 98 F (36.7 C) (06/21 1501) Temp Source: Oral (06/21 1501) BP: 124/74 (06/21 1501) Pulse Rate: 67 (06/21 1501)  Labs: Recent Labs    09/09/17 0619 09/09/17 0844 09/10/17 0523 09/11/17 0640  HGB 6.2* 6.5* 8.7* 8.7*  HCT 19.6* 20.4* 26.8* 26.7*  PLT 184  --  169 163  CREATININE 1.00  --   --   --     Estimated Creatinine Clearance: 88.9 mL/min (by C-G formula based on SCr of 1 mg/dL).   Medical History: Past Medical History:  Diagnosis Date  . Cancer [facility])    FNA of thyroid positive for onconytic/hurthle cell carcinoma  . Chronic back pain   . DDD (degenerative disc disease), cervical   . DJD (degenerative joint disease)   . History of rectal fissure   . HIT (heparin-induced thrombocytopenia) ([place])   . Hypertension   . Obesity   . Paraplegia at T4 level ([place])   . Pulmonary embolus, right ([place]) 2015  . 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  Anticoag: h/o DVT/PE on Xarelto PTA>>Arixtra>>Xarelto (post-op 6/21). - 52 y/o, 105kg, Scr CrCl 88, Hgb 8.7 (2 units PRBC 6/19), Plts 163  Goal of Therapy:  Therapeutic oral anticoagulation   Plan:  Xarelto 20mg  daily later tonight at midnight.  Larae Caison S. Alford [place]land, PharmD, BCPS Clinical Staff Pharmacist Pager [phone]  Eilene Ghazi Stillinger 09/11/2017,4:32 PM

## 2017-09-11 NOTE — IPOC Note (Signed)
Overall Plan of Care [facility]) Patient Details Name: Sarah [name] MRN: [id number] DOB: 04-Feb-1966  Admitting Diagnosis: <principal problem not specified>  Hospital Problems: Active Problems:   Paraplegia [facility])     Functional Problem List: Nursing Bowel, Bladder, Sensory, Skin Integrity, Edema, Endurance, Motor, Pain, Medication Management  PT Balance, Sensory, Endurance, Motor, Safety  OT Balance, Motor, Behavior, Pain, Skin Integrity, Edema, Endurance, Safety  SLP    TR         Basic ADL's: OT Grooming, Bathing, Dressing, Toileting     Advanced  ADL's: OT       Transfers: PT Bed Mobility, Bed to Chair, Car, [employer], Floor  OT Toilet     Locomotion: PT Wheelchair Mobility, Ambulation     Additional Impairments: OT None  SLP        TR      Anticipated Outcomes Item Anticipated Outcome  Self Feeding n/a  Swallowing      Basic self-care  setup   Toileting  setup    Bathroom Transfers setup  with slide board  Bowel/Bladder  continent of bowel/bladder with max assist bowel program and timed toileting q3hr while awake  Transfers  min A with slide board transfer   Locomotion  mod I with w/c propulsion in community and home environment   Communication     Cognition     Pain  <3 out of 10 on a scale from 1-10  Safety/Judgment  free from fall/injury with moderate assistance   Therapy Plan: PT Intensity: Minimum of 1-2 x/day ,45 to 90 minutes PT Duration Estimated Length of Stay: 4 weeks  OT Intensity: Minimum of 1-2 x/day, 45 to 90 minutes OT Frequency: 5 out of 7 days OT Duration/Estimated Length of Stay: 2-2 1/2 weeks      Team Interventions: Nursing Interventions Patient/Family Education, Pain Management, Bladder Management, Bowel Management, Skin Care/Wound Management, Medication Management, Discharge Planning, Psychosocial Support, Disease Management/Prevention  PT interventions Ambulation/gait training, Discharge planning, Functional mobility  training, Psychosocial support, Therapeutic Activities, Balance/vestibular training, Disease management/prevention, Neuromuscular re-education, Therapeutic Exercise, Wheelchair propulsion/positioning, DME/adaptive equipment instruction, UE/LE Strength taining/ROM, Community reintegration, Technical sales engineer stimulation, Barrister's clerk education, UE/LE Coordination activities  OT Interventions Training and development officer, Community reintegration, Disease mangement/prevention, Neuromuscular re-education, Barrister's clerk education, Self Care/advanced ADL retraining, Splinting/orthotics, Therapeutic Exercise, UE/LE Coordination activities, Wheelchair propulsion/positioning, Discharge planning, DME/adaptive equipment instruction, Pain management, Functional mobility training, Psychosocial support, Skin care/wound managment, Therapeutic Activities, UE/LE Strength taining/ROM  SLP Interventions    TR Interventions    SW/CM Interventions Discharge Planning, Psychosocial Support, Patient/Family Education   Barriers to Discharge MD  Medical stability  Nursing Neurogenic Bowel & Bladder, Wound Care, Incontinence, Pending chemo/radiation    PT      OT Pending chemo/radiation    SLP      SW       Team Discharge Planning: Destination: PT-Home ,OT- Home , SLP-  Projected Follow-up: PT-Home health PT, OT-  Home health OT, SLP-  Projected Equipment Needs: PT-Wheelchair (measurements), Wheelchair cushion (measurements), OT- To be determined, SLP-  Equipment Details: PT- , OT-  Patient/family involved in discharge planning: PT- Patient,  OT-Patient, Family member/caregiver, SLP-   MD ELOS: TBD Medical Rehab Prognosis:  Fair Assessment: The patient has been admitted for CIR therapies with the diagnosis of thoracic myelopathy, newly dx'ed left femur fx. The team will be addressing functional mobility, strength, stamina, balance, safety, adaptive techniques and equipment, self-care, bowel and bladder mgt,  patient and caregiver education, NMR, orthot precautions. Goals have been set  at TBD, stay interrupted due to femur fx. Reassess when pt readmitted.    [name], MD, FAAPMR      See Team Conference Notes for weekly updates to the plan of care

## 2017-09-11 NOTE — Anesthesia Preprocedure Evaluation (Signed)
Anesthesia Evaluation  Patient identified by MRN, date of birth, ID band Patient awake    Reviewed: Allergy & Precautions, NPO status , Patient's Chart, lab work & pertinent test results  Airway Mallampati: II  TM Distance: >3 FB Neck ROM: Full    Dental no notable dental hx.    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  One level home with ramped entrance.   Renal/GU negative Renal ROS  negative genitourinary   Musculoskeletal negative musculoskeletal ROS (+)   Abdominal   Peds negative pediatric ROS (+)  Hematology negative hematology ROS (+)   Anesthesia Other Findings   Reproductive/Obstetrics negative OB ROS                             Anesthesia Physical Anesthesia Plan  ASA: IV  Anesthesia Plan: General   Post-op Pain Management:    Induction: Intravenous  PONV Risk Score and Plan: 3 and Ondansetron, Dexamethasone, Treatment may vary due to age or medical condition and Midazolam  Airway Management Planned: Oral ETT  Additional Equipment:   Intra-op Plan:   Post-operative Plan: Extubation in OR  Informed Consent: I have reviewed the patients History and Physical, chart, labs and discussed the procedure including the risks, benefits and alternatives for the proposed anesthesia with the patient or authorized representative who has indicated his/her understanding and acceptance.   Dental advisory given  Plan Discussed with: CRNA and Surgeon  Anesthesia Plan Comments:         Anesthesia Quick  Evaluation

## 2017-09-11 NOTE — Anesthesia Procedure Notes (Signed)
Procedure Name: Intubation Date/Time: 09/11/2017 10:33 AM Performed by: Wilburn Cornelia, CRNA Pre-anesthesia Checklist: Patient identified, Emergency Drugs available, Suction available, Patient being monitored and Timeout performed Patient Re-evaluated:Patient Re-evaluated prior to induction Oxygen Delivery Method: Circle system utilized Preoxygenation: Pre-oxygenation with 100% oxygen Induction Type: IV induction Ventilation: Mask ventilation without difficulty Laryngoscope Size: Mac and 3 Grade View: Grade II Tube type: Oral Tube size: 7.0 mm Number of attempts: 1 Placement Confirmation: CO2 detector,  positive ETCO2,  breath sounds checked- equal and bilateral and ETT inserted through vocal cords under direct vision Secured at: 21 cm Tube secured with: Tape Dental Injury: Teeth and Oropharynx as per pre-operative assessment

## 2017-09-11 NOTE — Transfer of Care (Signed)
Immediate Anesthesia Transfer of Care Note  Patient: Sarah [name]  Procedure(s) Performed: INTRAMEDULLARY (IM) RETROGRADE FEMORAL NAILING (Left )  Patient Location: PACU  Anesthesia Type:General  Level of Consciousness: awake, alert  and oriented  Airway & Oxygen Therapy: Patient Spontanous Breathing and Patient connected to nasal cannula oxygen  Post-op Assessment: Report given to RN and Post -op Vital signs reviewed and stable  Post vital signs: Reviewed and stable  Last Vitals:  Vitals Value Taken Time  BP    Temp    Pulse 74 09/11/2017 12:50 PM  Resp 20 09/11/2017 12:50 PM  SpO2 100 % 09/11/2017 12:50 PM  Vitals shown include unvalidated device data.  Last Pain:  Vitals:   09/11/17 0823  TempSrc:   PainSc: 0-No pain         Complications: No apparent anesthesia complications

## 2017-09-11 NOTE — Plan of Care (Signed)

## 2017-09-11 NOTE — Discharge Summary (Signed)
Discharge summary job [phone]

## 2017-09-11 NOTE — Progress Notes (Signed)
Pt prepared for surgery this am. All belongings packed and clothing/jewelry removed. Report called to short stay and pt discharged.

## 2017-09-12 LAB — BASIC METABOLIC PANEL
ANION GAP: 5 (ref 5–15)
BUN: 25 mg/dL — ABNORMAL HIGH (ref 6–20)
CO2: 28 mmol/L (ref 22–32)
Calcium: 7.2 mg/dL — ABNORMAL LOW (ref 8.9–10.3)
Chloride: 104 mmol/L (ref 101–111)
Creatinine, Ser: 1.03 mg/dL — ABNORMAL HIGH (ref 0.44–1.00)
GFR calc non Af Amer: >60 mL/min (ref >60)
Glucose, Bld: 173 mg/dL — ABNORMAL HIGH (ref 65–99)
POTASSIUM: 4.8 mmol/L (ref 3.5–5.1)
SODIUM: 137 mmol/L (ref 135–145)

## 2017-09-12 LAB — CBC
HEMATOCRIT: 24 % — AB (ref 36.0–46.0)
Hemoglobin: 7.6 g/dL — ABNORMAL LOW (ref 12.0–15.0)
MCH: 30.4 pg (ref 26.0–34.0)
MCHC: 31.7 g/dL (ref 30.0–36.0)
MCV: 96 fL (ref 78.0–100.0)
Platelets: 143 10*3/uL — ABNORMAL LOW (ref 150–400)
RBC: 2.5 MIL/uL — AB (ref 3.87–5.11)
RDW: 16.6 % — ABNORMAL HIGH (ref 11.5–15.5)
WBC: 11.4 10*3/uL — AB (ref 4.0–10.5)

## 2017-09-12 NOTE — Plan of Care (Signed)

## 2017-09-12 NOTE — Discharge Instructions (Addendum)
Information on my medicine - XARELTO (Rivaroxaban)  Why was Xarelto prescribed for you? Xarelto was prescribed for you to reduce the risk of a blood clot forming and due to your history of pulmonary embolism.  What do you need to know about xarelto ? Take your Xarelto ONCE DAILY at the same time every day with your evening meal. If you have difficulty swallowing the tablet whole, you may crush it and mix in applesauce just prior to taking your dose.  Take Xarelto exactly as prescribed by your doctor and DO NOT stop taking Xarelto without talking to the doctor who prescribed the medication.  Stopping without other stroke prevention medication to take the place of Xarelto may increase your risk of developing a clot that causes a stroke.  Refill your prescription before you run out.  After discharge, you should have regular check-up appointments with your healthcare provider that is prescribing your Xarelto.  In the future your dose may need to be changed if your kidney function or weight changes by a significant amount.  What do you do if you miss a dose? If you are taking Xarelto ONCE DAILY and you miss a dose, take it as soon as you remember on the same day then continue your regularly scheduled once daily regimen the next day. Do not take two doses of Xarelto at the same time or on the same day.   Important Safety Information A possible side effect of Xarelto is bleeding. You should call your healthcare provider right away if you experience any of the following: ? Bleeding from an injury or your nose that does not stop. ? Unusual colored urine (red or dark brown) or unusual colored stools (red or black). ? Unusual bruising for unknown reasons. ? A serious fall or if you hit your head (even if there is no bleeding).  Some medicines may interact with Xarelto and might increase your risk of bleeding while on Xarelto. To help avoid this, consult your healthcare provider or  pharmacist prior to using any new prescription or non-prescription medications, including herbals, vitamins, non-steroidal anti-inflammatory drugs (NSAIDs) and supplements.  This website has more information on Xarelto: [email or web address]

## 2017-09-12 NOTE — Op Note (Deleted)
  The note originally documented on this encounter has been moved the the encounter in which it belongs.  

## 2017-09-12 NOTE — Op Note (Signed)
09/11/2017  4:12 PM  PATIENT:  Sarah [name]  52 y.o. female  PRE-OPERATIVE DIAGNOSIS:  LEFT FEMORAL SHAFT FRACTURE, LIKELY SUBACUTE  POST-OPERATIVE DIAGNOSIS:  LEFT FEMORAL SHAFT FRACTURE, LIKELY SUBACUTE  PROCEDURE:  Procedure(s): INTRAMEDULLARY (IM) RETROGRADE FEMORAL NAILING (Left) WITH BIOMET [place] 10.5 X 457mm statically locked  SURGEON:  Surgeon(s) and Role:    Altamese [place], MD - Primary  PHYSICIAN ASSISTANT: Ainsley Spinner, PA-C  ANESTHESIA:   general  EBL:  75 mL   BLOOD ADMINISTERED:none  DRAINS: none   LOCAL MEDICATIONS USED:  NONE  SPECIMEN:  No Specimen  DISPOSITION OF SPECIMEN:  N/A  COUNTS:  YES  TOURNIQUET:  * No tourniquets in log *  DICTATION: .Note written in EPIC  PLAN OF CARE: Admit to inpatient   PATIENT DISPOSITION:  PACU - hemodynamically stable.   Delay start of Pharmacological VTE agent (>24hrs) due to surgical blood loss or risk of bleeding: no  BRIEF SUMMARY OF INDICATION FOR PROCEDURE:  Sarah [name] is a  52 y.o. who sustained left femur fracture in a fall eight days ago. Despite her paraplegia, I recommended retrograde nailing of the femur to facilitate early mobilization and range of motion. The risks and benefits of this operation were discussed with the patient including the possibilities of infection, nerve injury, vessel injury, DVT/ PE, loss of motion, arthritis, symptomatic hardware, and need for further surgery among others.  After full discussion, the patient gave consent to proceed.  BRIEF SUMMARY OF PROCEDURE:  After administration of preoperative antibiotics, the patient was taken to the operating room where general anesthesia was induced.  Careful positioning was performed with a bump placed under the left hip, and control of the fracture with distraction during positioning and prepping to prevent neurovascular injury. Standard prep and drape was performed with chlorhexidine scrub and wash initially, then Betadine scrub and  paint.  Time-out was held and then the radiolucent triangle and towel bumps used to gain length and sagittal alignment with traction and flexion of the knee.  A 2.5 cm incision was made at the base of the patellar tendon, extending proximally, followed with a medial parapatellar retinacular incision.  The sharp guide pin was placed directly anterior to Blumensaat's line in the middle of the condyle and advanced on AP and lateral projections into the center-center position of the distal femur. My assistant continued traction while I fine tuned the bumps and he also applied pressure with a mallet to produce coronal plane angulation.  The starting reamer was then used distally while protecting the soft tissues.  This was followed by introduction of the ball-tipped guidewire across the fracture site into the proximal femur up close to the piriformis.  Nail length was measured. Sequential reaming followed while maintaining reduction, encountering chatter at 10 mm and reaming up to 12 mm, placing a 10.5 mm x 400 mm nail.  After confirming appropriate seating of the nail beyond Blumensaat's line on the lateral, locking screws were placed distally off the guide, and checked on orthogonal images to confirm placement within the nail and appropriate length. My assistant then pulled traction to restore length as much as possible in this subacute scenario while my other assistant continued to apply varus-valgus force as she had done during reaming to maintain alignment in the AP plane. Two proximal locks were then placed in static mode using perfect circle technique and a captured screwdriver.  These screws were also confirmed for length and position. At the conclusion of the  procedure, I examined the knee for varus and valgus stability after fixation and did not identify significant instability. Also with my assistant, Ainsley Spinner, PA-C, we then irrigated all wounds thoroughly and closed them in standard layered fashion,  working simultaneously to reduce overall time in the OR. The wounds were dressed and a gently compressive wrap from the ankle to thigh was applied.  The patient was awakened and taken to the PACU in stable condition.    PROGNOSIS:  The patient will have unrestricted range of motion of the knee and hip, and early mobilization will be encouraged with weight bearing for transfers.  DVT prophylaxis will be with weight-based Lovenox. Metabolic bone workup is ongoing. Given the patients associated injuries and comorbidities the risk of complications remains elevated which has been discussed with the family.    Sarah [name], M.D.

## 2017-09-12 NOTE — Progress Notes (Signed)
Inpatient Rehabilitation Admissions Coordinator  Patient readmitted to acute postop orthopedic surgery. Monday I will seek authorization from [facility] for a possible readmission to CIR pending insurance approval. I also await OT eval to proceed Monday.  Danne Baxter, RN, MSN Rehab Admissions Coordinator [phone] 09/12/2017 5:13 PM

## 2017-09-12 NOTE — Evaluation (Signed)
Occupational Therapy Evaluation Patient Details Name: Sarah [name] MRN: [id number] DOB: 12/20/1965 Today's Date: 09/12/2017    History of Present Illness Pt is a 52 y/o female admitted from CIR secondary to findings of L distal femur fx. Now s/p IM nail. Pt now NWB L LE. 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   Clinical Impression   PTA, pt was participating in CIR level rehabilitation and was able to utilize [place] for assisted sit<>stand transfers and complete UB ADL without assistance. Prior to tumor resection, she was able to complete toilet transfers with both sliding board and RW per her report. Pt presents to OT evaluation, highly motivated to participate and progress with her recovery. She requires total assistance for LB ADL and toileting hygiene. She is able to complete UB ADL and grooming tasks with set-up at this time. Pt able to complete functional rolling tasks with mod-max assist and facilitated improved core stability in preparation for ADL participation with therapeutic activities as detailed below. She would benefit from continued OT services while admitted to improve independence and safety with ADL and functional mobility. She would best benefit from CIR level therapies post-acute D/C to maximize independence and safety with ADL participation. OT will continue to follow while admitted.  Of note, recommended frequent skin checks with SCD's as pt with no sensation in BLE as well as establishing 2 hour checks for bowel movement to maximize safety and diminish risk of skin breakdown.     Follow Up Recommendations  CIR;Supervision/Assistance - 24 hour    Equipment Recommendations  Other (comment)(defer to next venue of care)    Recommendations for Other Services Rehab consult     Precautions / Restrictions Precautions Precautions: Fall;Back Restrictions Weight  Bearing Restrictions: Yes LLE Weight Bearing: Non weight bearing      Mobility Bed Mobility Overal bed mobility: Needs Assistance Bed Mobility: Rolling Rolling: Mod assist;Max assist Sidelying to sit: Max assist;+2 for physical assistance     Sit to sidelying: Max assist;+2 for physical assistance General bed mobility comments: Facilitated improved core strength to initiate rolling. Pt requiring mod assist to the R and max assist to the L.   Transfers                      Balance Overall balance assessment: Needs assistance Sitting-balance support: Bilateral upper extremity supported Sitting balance-Leahy Scale: Poor Sitting balance - Comments: pt only able to tolerate sitting EOB ~1 minute and required transition back to lying in bed secondary to pain                                   ADL either performed or assessed with clinical judgement   ADL Overall ADL's : Needs assistance/impaired Eating/Feeding: Set up;Bed level   Grooming: Set up;Bed level   Upper Body Bathing: Set up;Bed level   Lower Body Bathing: Maximal assistance;Bed level   Upper Body Dressing : Set up;Bed level   Lower Body Dressing: Total assistance;Bed level   Toilet Transfer: Total assistance   Toileting- Clothing Manipulation and Hygiene: Total assistance;Bed level       Functional mobility during ADLs: Total assistance General ADL Comments: Pt hesitant to participate in mobility past bed mobility. She was agreeable to core strengthening activities and rolling for improved ADL participation.  Vision Baseline Vision/History: Wears glasses Wears Glasses: Reading only Patient Visual Report: No change from baseline(blurred vision after PT but this cleared) Additional Comments: Will continue assessment. Pt reports that after working with PT, she had blurry vision but this resolved and she thinks this is from having different reading glasses present.      Perception      Praxis      Pertinent Vitals/Pain Pain Assessment: Faces Faces Pain Scale: Hurts even more Pain Location: back with rolling Pain Descriptors / Indicators: Grimacing;Guarding Pain Intervention(s): Limited activity within patient's tolerance;Monitored during session;Repositioned     Hand Dominance Right   Extremity/Trunk Assessment Upper Extremity Assessment Upper Extremity Assessment: Generalized weakness   Lower Extremity Assessment Lower Extremity Assessment: Defer to PT evaluation(increased tone with attempted passive movement RLE) RLE Deficits / Details: pt with AROM hip flexion and knee flexion in supine in a flexion pattern; pt with sensation to only noxious stimulus in nail bed LLE Deficits / Details: pt with no active movement of L LE throughout evaluation. Pt with some sensation to light touch on toes and foot   Cervical / Trunk Assessment Cervical / Trunk Assessment: Other exceptions Cervical / Trunk Exceptions: pt with recent T3 spinal tumor resection   Communication Communication Communication: No difficulties   Cognition Arousal/Alertness: Awake/alert Behavior During Therapy: WFL for tasks assessed/performed Overall Cognitive Status: Within Functional Limits for tasks assessed                                     General Comments  Pt with no sensation for bowel/bladder. Educated nursing staff concerning need to diligently assess for bowel/bladder needs every 2 hours. Encouraged RN to remove SCD's momentarily and periodically to check skin for breakdown.    Exercises Exercises: Other exercises Other Exercises Other Exercises: In chair position in bed, facilitated functional reach with core activation to cross midline with mod assist at trunk x5 bilaterally Other Exercises: Facilitated horizontal adduction with trunk activation x10 bilaterally.    Shoulder Instructions      Home Living Family/patient expects to be discharged to:: Inpatient  rehab                                        Prior Functioning/Environment Level of Independence: Needs assistance  Gait / Transfers Assistance Needed: Prior to tumor resection, was performing slide board transfers and short distance ambulation. At rehab, was utilizing [place] for sit<>stand.  ADL's / Homemaking Assistance Needed: Assistance for LB ADL. Able to complete UB ADL without assistance at bed level.             OT Problem List: Decreased strength;Decreased range of motion;Decreased activity tolerance;Impaired balance (sitting and/or standing);Impaired vision/perception;Decreased coordination;Decreased safety awareness;Decreased knowledge of use of DME or AE;Decreased knowledge of precautions;Impaired tone;Impaired sensation;Impaired UE functional use;Pain      OT Treatment/Interventions: Self-care/ADL training;Therapeutic exercise;Neuromuscular education;Energy conservation;DME and/or AE instruction;Manual therapy;Modalities;Splinting;Therapeutic activities;Cognitive remediation/compensation;Visual/perceptual remediation/compensation;Patient/family education;Balance training    OT Goals(Current goals can be found in the care plan section) Acute Rehab OT Goals Patient Stated Goal: return to CIR OT Goal Formulation: With patient Time For Goal Achievement: 09/26/17 Potential to Achieve Goals: Good  OT Frequency: Min 2X/week   Barriers to D/C:            Co-evaluation  AM-PAC PT "6 Clicks" Daily Activity     Outcome Measure Help from another person eating meals?: None Help from another person taking care of personal grooming?: None Help from another person toileting, which includes using toliet, bedpan, or urinal?: Total Help from another person bathing (including washing, rinsing, drying)?: A Lot Help from another person to put on and taking off regular upper body clothing?: None Help from another person to put on and taking off  regular lower body clothing?: Total 6 Click Score: 16   End of Session Nurse Communication: Mobility status(see general comments)  Activity Tolerance: Patient tolerated treatment well Patient left: in bed;with call bell/phone within reach  OT Visit Diagnosis: Muscle weakness (generalized) (M62.81);Pain Pain - part of body: (back)                Time: 1422-1450 OT Time Calculation (min): 28 min Charges:  OT General Charges $OT Visit: 1 Visit OT Evaluation $OT Eval Moderate Complexity: 1 Mod OT Treatments $Therapeutic Activity: 8-22 mins G-Codes:     Norman Herrlich, MS OTR/L  Pager: Sarah [name] 09/12/2017, 5:23 PM

## 2017-09-12 NOTE — Progress Notes (Signed)
Subjective:    Patient reports pain as moderate.  Denies chest pain, Shortness of Breath  No nausea, vomiting  Objective: Vital signs in last 24 hours: Temp:  [97.4 F (36.3 C)-98.5 F (36.9 C)] 97.4 F (36.3 C) (06/22 0528) Pulse Rate:  [64-82] 71 (06/22 0528) Resp:  [12-23] 15 (06/22 0528) BP: (113-143)/(70-81) 117/73 (06/22 0528) SpO2:  [100 %] 100 % (06/22 0528)  Intake/Output from previous day: 06/21 0701 - 06/22 0700 In: 701.2 [I.V.:551.2; IV Piggyback:150] Out: 2125 [Urine:2125] Intake/Output this shift: No intake/output data recorded.  Recent Labs    09/10/17 0523 09/11/17 0640 09/12/17 0351  HGB 8.7* 8.7* 7.6*   Recent Labs    09/11/17 0640 09/12/17 0351  WBC 11.0* 11.4*  RBC 2.84* 2.50*  HCT 26.7* 24.0*  PLT 163 143*   Recent Labs    09/12/17 0351  NA 137  K 4.8  CL 104  CO2 28  BUN 25*  CREATININE 1.03*  GLUCOSE 173*  CALCIUM 7.2*   No results for input(s): LABPT, INR in the last 72 hours.   exam: dressing in place, no sign of bleeding Neuro basically absent to distal extremity, no change from baseline per patient Skin warm and dry with good capillary refill  Anticipated LOS equal to or greater than 2 midnights due to - Age 51 and older with one or more of the following:  - Obesity  - Expected need for hospital services (PT, OT, Nursing) required for safe  discharge  - Anticipated need for postoperative skilled nursing care or inpatient rehab  - Active co-morbidities: history of spinal cord tumor post resection; history of PE OR   - Unanticipated findings during/Post Surgery: Slow post-op progression: GI, pain control, mobility  - [name] a high risk of re-admission due to: None  Active Problems:   Closed fracture of left distal femur ([place]) Present on Admission: . Closed fracture of left distal femur ([place])  Assessment/Plan:    Advance diet  Plan to transfer to CIR when stable and available [name] non weight bearing left  lower extremity continue xarelto  BLAIR ROBERTS 09/12/2017, 10:51 AM  [phone]

## 2017-09-12 NOTE — Evaluation (Signed)
Physical Therapy Evaluation Patient Details Name: Sarah [name] MRN: [id number] DOB: Jul 27, 1965 Today's Date: 09/12/2017   History of Present Illness  Pt is a 52 y/o female admitted from CIR secondary to findings of L distal femur fx s/p IM nail. Pt now NWB L LE. 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    Clinical Impression  Pt presented supine in bed with HOB elevated, awake and willing to participate in therapy session. Pt was re-admitted from CIR. Pt stated that while in CIR she had been performing transfers with sliding board and use of [facility]. She reported that she was able to dress/bath her upper body and required assistance with lower body ADLs. Pt currently requires max A x2 for bed mobility and was able to sit EOB ~1 minute with close min guard and bilateral UE supports. Pt very limited secondary to back pain. Pt would continue to benefit from skilled physical therapy services at this time while admitted and after d/c to address the below listed limitations in order to improve overall safety and independence with functional mobility.     Follow Up Recommendations CIR;Supervision/Assistance - 24 hour    Equipment Recommendations  None recommended by PT    Recommendations for Other Services Rehab consult     Precautions / Restrictions Precautions Precautions: Fall;Back Restrictions Weight Bearing Restrictions: Yes LLE Weight Bearing: Non weight bearing      Mobility  Bed Mobility Overal bed mobility: Needs Assistance Bed Mobility: Rolling;Sidelying to Sit;Sit to Sidelying Rolling: Max assist;+2 for physical assistance Sidelying to sit: Max assist;+2 for physical assistance     Sit to sidelying: Max assist;+2 for physical assistance General bed mobility comments: pt able to use bilateral UEs on bed rail to assist with mobility, heavy physical assistance with  bilateral LEs and trunk  Transfers                    Ambulation/Gait                Stairs            Wheelchair Mobility    Modified Rankin (Stroke Patients Only)       Balance Overall balance assessment: Needs assistance Sitting-balance support: Bilateral upper extremity supported Sitting balance-Leahy Scale: Poor Sitting balance - Comments: pt only able to tolerate sitting EOB ~1 minute and required transition back to lying in bed secondary to pain                                     Pertinent Vitals/Pain Pain Assessment: Faces Faces Pain Scale: Hurts even more Pain Location: back Pain Descriptors / Indicators: Grimacing;Guarding Pain Intervention(s): Monitored during session;Repositioned    Home Living Family/patient expects to be discharged to:: Inpatient rehab                      Prior Function Level of Independence: Needs assistance   Gait / Transfers Assistance Needed: pt was performing sliding board transfers and sit<>stand transfers with [facility] in rehab  ADL's / Homemaking Assistance Needed: pt was able to wash and dress upper body and required assistance from CIR staff for lower body        Hand Dominance        Extremity/Trunk Assessment  Upper Extremity Assessment Upper Extremity Assessment: Generalized weakness    Lower Extremity Assessment Lower Extremity Assessment: Generalized weakness;RLE deficits/detail;LLE deficits/detail RLE Deficits / Details: pt with AROM hip flexion and knee flexion in supine in a flexion pattern; pt with sensation to only noxious stimulus in nail bed LLE Deficits / Details: pt with no active movement of L LE throughout evaluation. Pt with some sensation to light touch on toes and foot    Cervical / Trunk Assessment Cervical / Trunk Assessment: Other exceptions Cervical / Trunk Exceptions: pt with recent T3 spinal tumor resection  Communication   Communication: No  difficulties  Cognition Arousal/Alertness: Awake/alert Behavior During Therapy: WFL for tasks assessed/performed Overall Cognitive Status: Within Functional Limits for tasks assessed                                        General Comments      Exercises     Assessment/Plan    PT Assessment Patient needs continued PT services  PT Problem List Decreased strength;Decreased range of motion;Decreased activity tolerance;Decreased balance;Decreased mobility;Decreased coordination;Decreased knowledge of use of DME;Decreased safety awareness;Decreased knowledge of precautions;Pain       PT Treatment Interventions DME instruction;Functional mobility training;Therapeutic activities;Therapeutic exercise;Balance training;Neuromuscular re-education;Patient/family education    PT Goals (Current goals can be found in the Care Plan section)  Acute Rehab PT Goals Patient Stated Goal: return to CIR PT Goal Formulation: With patient Time For Goal Achievement: 09/26/17 Potential to Achieve Goals: Fair    Frequency Min 4X/week   Barriers to discharge        Co-evaluation               AM-PAC PT "6 Clicks" Daily Activity  Outcome Measure Difficulty turning over in bed (including adjusting bedclothes, sheets and blankets)?: Unable Difficulty moving from lying on back to sitting on the side of the bed? : Unable Difficulty sitting down on and standing up from a chair with arms (e.g., wheelchair, bedside commode, etc,.)?: Unable Help needed moving to and from a bed to chair (including a wheelchair)?: Total Help needed walking in hospital room?: Total Help needed climbing 3-5 steps with a railing? : Total 6 Click Score: 6    End of Session   Activity Tolerance: Patient limited by pain Patient left: in bed;with call bell/phone within reach;with SCD's reapplied;Other (comment)(bilateral LEs elevated with heel floating) Nurse Communication: Mobility status;Need for lift  equipment PT Visit Diagnosis: Other abnormalities of gait and mobility (R26.89);Pain Pain - part of body: (back)    Time: 5462-7035 PT Time Calculation (min) (ACUTE ONLY): 19 min   Charges:   PT Evaluation $PT Eval Moderate Complexity: 1 Mod     PT G Codes:        [place], PT, DPT [place] 09/12/2017, 2:25 PM

## 2017-09-13 LAB — BASIC METABOLIC PANEL
Anion gap: 5 (ref 5–15)
BUN: 24 mg/dL — AB (ref 6–20)
CO2: 27 mmol/L (ref 22–32)
Calcium: 7 mg/dL — ABNORMAL LOW (ref 8.9–10.3)
Chloride: 105 mmol/L (ref 101–111)
Creatinine, Ser: 0.98 mg/dL (ref 0.44–1.00)
GFR calc Af Amer: >60 mL/min (ref >60)
GFR calc non Af Amer: >60 mL/min (ref >60)
GLUCOSE: 178 mg/dL — AB (ref 65–99)
POTASSIUM: 4.7 mmol/L (ref 3.5–5.1)
Sodium: 137 mmol/L (ref 135–145)

## 2017-09-13 LAB — BPAM RBC
BLOOD PRODUCT EXPIRATION DATE: 201907142359
BLOOD PRODUCT EXPIRATION DATE: 201907162359
BLOOD PRODUCT EXPIRATION DATE: 201907202359
Blood Product Expiration Date: 201907202359
ISSUE DATE / TIME: 201906191247
ISSUE DATE / TIME: 201906211008
ISSUE DATE / TIME: 201906191640
ISSUE DATE / TIME: 201906211008
UNIT TYPE AND RH: 5100
UNIT TYPE AND RH: 5100
Unit Type and Rh: 5100
Unit Type and Rh: 5100

## 2017-09-13 LAB — TYPE AND SCREEN
ABO/RH(D): O POS
ANTIBODY SCREEN: NEGATIVE
UNIT DIVISION: 0
UNIT DIVISION: 0
UNIT DIVISION: 0
UNIT DIVISION: 0

## 2017-09-13 NOTE — Progress Notes (Signed)
Subjective:    Patient reports pain as mild. No nausea Taking po food/fluid well    Objective: Vital signs in last 24 hours: Temp:  [97.9 F (36.6 C)-99 F (37.2 C)] 97.9 F (36.6 C) (06/23 0600) Pulse Rate:  [79-81] 79 (06/23 0600) Resp:  [12-13] 13 (06/23 0600) BP: (92-102)/(55-68) 102/58 (06/23 0600) SpO2:  [100 %] 100 % (06/23 0600)  Intake/Output from previous day: 06/22 0701 - 06/23 0700 In: 1265.4 [P.O.:240; I.V.:1025.4] Out: 3250 [Urine:3250] Intake/Output this shift: No intake/output data recorded.  Recent Labs    09/11/17 0640 09/12/17 0351  HGB 8.7* 7.6*   Recent Labs    09/11/17 0640 09/12/17 0351  WBC 11.0* 11.4*  RBC 2.84* 2.50*  HCT 26.7* 24.0*  PLT 163 143*   Recent Labs    09/12/17 0351 09/13/17 0422  NA 137 137  K 4.8 4.7  CL 104 105  CO2 28 27  BUN 25* 24*  CREATININE 1.03* 0.98  GLUCOSE 173* 178*  CALCIUM 7.2* 7.0*   No results for input(s): LABPT, INR in the last 72 hours.  exam basically unchanged from yesterday  Dressing remains clean and dry, no signs of bleeding Toes warm, cap refill intact  Dressing on back from Dr Lacy Duverney surgery remains intact, no sign of irritation  Assessment/Plan:    Awaiting readmit to CIR Consider dressing change tomorrow of back, if Dr Christella Noa approves Foley to be d/c'd-RN will bladder scan if difiiculties    Sarah [name] 09/13/2017, 1:26 PM

## 2017-09-13 NOTE — Progress Notes (Addendum)
Hgb 7.6  B. Mancel Bale, [place] notified this morning. Pt was adamant to have her foley removed since she cannot get out bed easily, max x2 assist to EOB. I discussed this matter to Nehemiah Massed, since this is not a chronic foley and was just placed during surgery (09/11/17), she advised that it should be removed today as ordered. Explained to pt the increase possibility of urinary infection if keeping the foley longer. Foley cath was removed at 1530.

## 2017-09-14 ENCOUNTER — Encounter (HOSPITAL_COMMUNITY): Payer: Self-pay | Admitting: Orthopedic Surgery

## 2017-09-14 ENCOUNTER — Inpatient Hospital Stay (HOSPITAL_COMMUNITY): Payer: Medicare Other

## 2017-09-14 ENCOUNTER — Other Ambulatory Visit: Payer: Self-pay

## 2017-09-14 LAB — GLUCOSE, CAPILLARY: Glucose-Capillary: 182 mg/dL — ABNORMAL HIGH (ref 65–99)

## 2017-09-14 LAB — BASIC METABOLIC PANEL
ANION GAP: 3 — AB (ref 5–15)
BUN: 27 mg/dL — AB (ref 6–20)
CALCIUM: 7.2 mg/dL — AB (ref 8.9–10.3)
CO2: 27 mmol/L (ref 22–32)
Chloride: 106 mmol/L (ref 101–111)
Creatinine, Ser: 1.02 mg/dL — ABNORMAL HIGH (ref 0.44–1.00)
GFR calc Af Amer: >60 mL/min (ref >60)
GLUCOSE: 189 mg/dL — AB (ref 65–99)
Potassium: 4.9 mmol/L (ref 3.5–5.1)
Sodium: 136 mmol/L (ref 135–145)

## 2017-09-14 LAB — CBC
HCT: 25.5 % — ABNORMAL LOW (ref 36.0–46.0)
Hemoglobin: 8 g/dL — ABNORMAL LOW (ref 12.0–15.0)
MCH: 30.8 pg (ref 26.0–34.0)
MCHC: 31.4 g/dL (ref 30.0–36.0)
MCV: 98.1 fL (ref 78.0–100.0)
PLATELETS: 166 10*3/uL (ref 150–400)
RBC: 2.6 MIL/uL — ABNORMAL LOW (ref 3.87–5.11)
RDW: 16.8 % — AB (ref 11.5–15.5)
WBC: 8.6 10*3/uL (ref 4.0–10.5)

## 2017-09-14 NOTE — Progress Notes (Signed)
Inpatient Rehabilitation Admissions Coordinator  I await updated therapy assessment today and then will begin insurance authorization for a possible inpt rehab admit pending insurance approval. I met with patient at bedside and she is in agreement.  Danne Baxter, RN, MSN Rehab Admissions Coordinator [phone] 09/14/2017 11:57 AM

## 2017-09-14 NOTE — Anesthesia Postprocedure Evaluation (Signed)
Anesthesia Post Note  Patient: Sarah [name]  Procedure(s) Performed: INTRAMEDULLARY (IM) RETROGRADE FEMORAL NAILING (Left )     Patient location during evaluation: PACU Anesthesia Type: General Level of consciousness: awake and alert Pain management: pain level controlled Vital Signs Assessment: post-procedure vital signs reviewed and stable Respiratory status: spontaneous breathing, nonlabored ventilation, respiratory function stable and patient connected to nasal cannula oxygen Cardiovascular status: blood pressure returned to baseline and stable Postop Assessment: no apparent nausea or vomiting Anesthetic complications: no    Last Vitals:  Vitals:   09/11/17 1440 09/11/17 1501  BP:  124/74  Pulse: 67 67  Resp: 15 16  Temp: 36.6 C 36.7 C  SpO2: 100% 100%    Last Pain:  Vitals:   09/11/17 1501  TempSrc: Oral  PainSc: 0-No pain                 Adrean Heitz S

## 2017-09-14 NOTE — Progress Notes (Signed)
Occupational Therapy Treatment Patient Details Name: Sarah [name] MRN: [id number] DOB: [birth year] Today's Date: 09/14/2017    History of present illness Pt is a 52 y/o female admitted from CIR secondary to findings of L distal femur fx. Now s/p IM nail. Pt now NWB L LE. 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   OT comments  Pt presents sitting up in recliner agreeable to OT tx session. Pt engaging in bil UE exercises this session for added strengthening and endurance in preparation for functional transfers and ADL completion. Additional PROM/stretching completed to bil LEs while seated. Pt continues to require totalA for LB ADL, setup-minA for UB ADL; is motivated to work and progress with therapy to reach PLOF. Feel CIR recommendation remains appropriate at this time. Will continue to follow acutely to progress pt towards established OT goals.    Follow Up Recommendations  CIR;Supervision/Assistance - 24 hour    Equipment Recommendations  Other (comment)(defer to next venue )          Precautions / Restrictions Precautions Precautions: Fall;Back Precaution Booklet Issued: No Precaution Comments: throaic region, LEs spasicity  Restrictions Weight Bearing Restrictions: Yes LLE Weight Bearing: Non weight bearing       Mobility Bed Mobility               General bed mobility comments: OOB in recliner upon arrival                                                                    ADL either performed or assessed with clinical judgement   ADL Overall ADL's : Needs assistance/impaired                                       General ADL Comments: pt reports having completed ADLs earlier today; focus of session on UB strengthening within available limits given back precautions and LE PROM while seated in recliner                         Cognition Arousal/Alertness: Awake/alert Behavior During Therapy: WFL for tasks assessed/performed Overall Cognitive Status: Within Functional Limits for tasks assessed                                          Exercises General Exercises - Upper Extremity Shoulder Flexion: AROM;20 reps;Limitations;Bar weights/barbell;Both;Seated Bar Weights/Barbell (Shoulder Flexion): 1 lb Shoulder Flexion Limitations: limited to 0-90*  Shoulder Extension: AROM;Both;Seated;Bar weights/barbell;20 reps Bar Weights/Barbell (Shoulder Extension): 1 lb Elbow Flexion: AROM;20 reps;Both;Seated;Bar weights/barbell Bar Weights/Barbell (Elbow Flexion): 1 lb Elbow Extension: AROM;Both;Seated;20 reps;Bar weights/barbell Bar Weights/Barbell (Elbow Extension): 1 lb General Exercises - Lower Extremity Ankle Circles/Pumps: PROM;10 reps;Both;Seated Long Arc Quad: PROM;10 reps;Both;Seated Hand Exercises Forearm Supination: AROM;10 reps;Both;Bar weights/barbell;Seated Forearm Pronation: AROM;Both;10 reps;Bar weights/barbell;Seated                Pertinent Vitals/ Pain       Pain Assessment: Faces Faces  Pain Scale: Hurts little more Pain Location: back  Pain Descriptors / Indicators: Guarding;Sore Pain Intervention(s): Monitored during session;Other (comment)(RN already aware of pt request for pain meds )  Home Living                                                        Frequency  Min 2X/week        Progress Toward Goals  OT Goals(current goals can now be found in the care plan section)  Progress towards OT goals: Progressing toward goals  Acute Rehab OT Goals Patient Stated Goal: return to CIR OT Goal Formulation: With patient Time For Goal Achievement: 09/26/17 Potential to Achieve Goals: Good  Plan Discharge plan remains appropriate    Co-evaluation                 AM-PAC PT "6 Clicks" Daily Activity      Outcome Measure   Help from another person eating meals?: None Help from another person taking care of personal grooming?: None Help from another person toileting, which includes using toliet, bedpan, or urinal?: Total Help from another person bathing (including washing, rinsing, drying)?: A Lot Help from another person to put on and taking off regular upper body clothing?: A Little Help from another person to put on and taking off regular lower body clothing?: Total 6 Click Score: 15    End of Session    OT Visit Diagnosis: Muscle weakness (generalized) (M62.81);Pain Pain - part of body: (back )   Activity Tolerance Patient tolerated treatment well   Patient Left in chair;with call bell/phone within reach;with chair alarm set   Nurse Communication Mobility status        Time: 7564-3329 OT Time Calculation (min): 21 min  Charges: OT General Charges $OT Visit: 1 Visit OT Treatments $Therapeutic Activity: 8-22 mins  Lou Cal, [place] Pager [phone] 09/14/2017    Raymondo Band 09/14/2017, 4:45 PM

## 2017-09-14 NOTE — Progress Notes (Signed)
Physical Therapy Treatment Patient Details Name: Sarah [name] MRN: [id number] DOB: November 08, 1965 Today's Date: 09/14/2017    History of Present Illness Pt is a 52 y/o female admitted from CIR secondary to findings of L distal femur fx. Now s/p IM nail. Pt now NWB L LE. 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    PT Comments    Pt performed transfer from bed to chair via posterior scoot.  Pt with new weight bearing status and limited use of B LEs.  Pt continues to present with poor core strength and required assistance to maintain trunk control while scooting to chair.  Pt with limited pain during session.  She remains motivated and continues to benefit from skilled rehab in a post acute setting.  Pt is currently requiring +3 assistance for OOB to chair.     Follow Up Recommendations  CIR;Supervision/Assistance - 24 hour     Equipment Recommendations  None recommended by PT    Recommendations for Other Services Rehab consult     Precautions / Restrictions Precautions Precautions: Fall;Back Precaution Booklet Issued: No Precaution Comments: throaic region, LEs spasicity  Restrictions Weight Bearing Restrictions: Yes LLE Weight Bearing: Non weight bearing    Mobility  Bed Mobility Overal bed mobility: Needs Assistance Bed Mobility: Rolling Rolling: Max assist;+2 for safety/equipment Sidelying to sit: Max assist;+2 for physical assistance       General bed mobility comments: Pt required asisstance to roll to R and L to replace pad and perform pericare.  Pt able to reach for railing to assist with transfer.  Pt to sit upright required assistance for LE advancement and trunk elevation.  She was able to pull on therapist as a railing to achieve sitting.  Once in sitting she supported her self with B UEs to prepare for transfer OOB.    Transfers Overall transfer level: Needs  assistance Equipment used: (bed pad) Transfers: Comptroller transfers: Max assist;Total assist(+3)   General transfer comment: Pt required cues hand placement to push with B hands to maintain trunk support while PTA and tech scooted patient back into recliner chair.  She required two separate scoots to maintain trunk extension.  Pt with posterior lean in sitting.    Ambulation/Gait                 Stairs             Wheelchair Mobility    Modified Rankin (Stroke Patients Only)       Balance Overall balance assessment: Needs assistance Sitting-balance support: Bilateral upper extremity supported Sitting balance-Leahy Scale: Poor       Standing balance-Leahy Scale: (NT)                              Cognition Arousal/Alertness: Awake/alert Behavior During Therapy: WFL for tasks assessed/performed Overall Cognitive Status: Within Functional Limits for tasks assessed                                        Exercises      General Comments        Pertinent Vitals/Pain Pain Assessment: Faces Faces Pain Scale: Hurts a little bit Pain Location: back with rolling Pain  Descriptors / Indicators: Grimacing;Guarding Pain Intervention(s): Monitored during session;Repositioned    Home Living Family/patient expects to be discharged to:: Unsure Living Arrangements: Other relatives                  Prior Function            PT Goals (current goals can now be found in the care plan section) Acute Rehab PT Goals Patient Stated Goal: return to CIR Potential to Achieve Goals: Fair Progress towards PT goals: Progressing toward goals    Frequency    Min 4X/week      PT Plan Current plan remains appropriate    Co-evaluation              AM-PAC PT "6 Clicks" Daily Activity  Outcome Measure  Difficulty turning over in bed (including adjusting bedclothes, sheets and  blankets)?: Unable Difficulty moving from lying on back to sitting on the side of the bed? : Unable Difficulty sitting down on and standing up from a chair with arms (e.g., wheelchair, bedside commode, etc,.)?: Unable Help needed moving to and from a bed to chair (including a wheelchair)?: Total Help needed walking in hospital room?: Total Help needed climbing 3-5 steps with a railing? : Total 6 Click Score: 6    End of Session Equipment Utilized During Treatment: (bed pad) Activity Tolerance: Patient limited by pain Patient left: in chair;with chair alarm set Nurse Communication: Mobility status;Need for lift equipment(maxisky sling placed for safe transfer back to bed.  ) PT Visit Diagnosis: Other abnormalities of gait and mobility (R26.89);Pain Pain - part of body: (back)     Time: 5486-2824 PT Time Calculation (min) (ACUTE ONLY): 24 min  Charges:  $Therapeutic Activity: 23-37 mins                    G CodesGovernor Rooks, PTA pager [phone]    Cristela Blue 09/14/2017, 12:43 PM

## 2017-09-14 NOTE — Progress Notes (Signed)
Orthopedic Trauma Service Progress Note   Patient ID: Sarah [name] MRN: [id number] DOB/AGE: Aug 06, 1965 52 y.o.  Subjective:  Doing well No new issues  Hopeful to return to CIR   Still no real motor or sensory function in her lower extremities  States she had a flicker of toe motion?  No CP or SOB No N/V No abd pain   ROS As above  Objective:   VITALS:   Vitals:   09/13/17 1536 09/13/17 2109 09/14/17 0504 09/14/17 0844  BP: (!) 100/56 109/64 117/76 120/73  Pulse: 75 79 69   Resp:  14 15   Temp: 98.4 F (36.9 C) 98 F (36.7 C) 98.2 F (36.8 C)   TempSrc: Oral Oral Oral   SpO2: 100% 100% 100%     Estimated body mass index is 32.38 kg/m as calculated from the following:   Height as of 09/08/17: 5\' 11"  (1.803 m).   Weight as of 09/09/17: 105.3 kg (232 lb 2.3 oz).   Intake/Output      06/23 0701 - 06/24 0700 06/24 0701 - 06/25 0700   P.O. 960 120   I.V. 268.9    Total Intake 1228.9 120   Urine 1800    Total Output 1800    Net -571.1 +120          LABS  Results for orders placed or performed during the hospital encounter of 09/11/17 (from the past 24 hour(s))  Basic metabolic panel     Status: Abnormal   Collection Time: 09/14/17  4:07 AM  Result Value Ref Range   Sodium 136 135 - 145 mmol/L   Potassium 4.9 3.5 - 5.1 mmol/L   Chloride 106 101 - 111 mmol/L   CO2 27 22 - 32 mmol/L   Glucose, Bld 189 (H) 65 - 99 mg/dL   BUN 27 (H) 6 - 20 mg/dL   Creatinine, Ser 1.02 (H) 0.44 - 1.00 mg/dL   Calcium 7.2 (L) 8.9 - 10.3 mg/dL   GFR calc non Af Amer >60 >60 mL/min   GFR calc Af Amer >60 >60 mL/min   Anion gap 3 (L) 5 - 15  CBC     Status: Abnormal   Collection Time: 09/14/17  8:48 AM  Result Value Ref Range   WBC 8.6 4.0 - 10.5 K/uL   RBC 2.60 (L) 3.87 - 5.11 MIL/uL   Hemoglobin 8.0 (L) 12.0 - 15.0 g/dL   HCT 25.5 (L) 36.0 - 46.0 %   MCV 98.1 78.0 - 100.0 fL   MCH 30.8 26.0 - 34.0 pg   MCHC 31.4 30.0 - 36.0 g/dL   RDW 16.8 (H)  11.5 - 15.5 %   Platelets 166 150 - 400 K/uL     PHYSICAL EXAM:   Gen: resting comfortably in bed, NAD, appears well, pleasant  Lungs: CTA B  Cardiac: RRR Abd:  + BS, NT Ext:       Left Lower Extremity   Dressings removed  Incisions look fantastic   Ext warm   Swelling controlled  No motor or sensory functions noted other than able to feel pressure   + DP pulse  Compartments are soft   Assessment/Plan:     Principal Problem:   Closed fracture of left distal femur (HCC) Active Problems:   Hurthle cell carcinoma of thyroid (HCC)   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 [facility])   Neurogenic bowel   Type 2 diabetes mellitus without complication (HCC)   Hypertension   Secondary malignant neoplasm  of vertebral column [facility])   Thoracic spine tumor   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  POD/HD#: 52  52 year old female s/p fall related to thoracic tumor from metastatic cancer with left distal femur fracture  - fall   -Closed left distal third femur fracture s/p IMN  Nonweightbearing left leg  Unrestricted range of motion left hip and knee  PT and OT   Aggressive passive range of motion to maintain joint pliability   Dressing changes as needed   Okay to clean wounds with soap and water only    CIR pending insurance reapproval   Pt states that she did not fall at CIR.  believes this occurred from her fall at home   - Pain  management:  Continue with current management  - ABL anemia/Hemodynamics  Stable  - Medical issues   Stable  - DVT/PE prophylaxis:  Xarelto restarted   - ID:   periop abx completed  - Metabolic Bone Disease:  Check vitamin d   - Activity:  Up with therapy   - FEN/GI prophylaxis/Foley/Lines:  Reg diet  Foley dc'd    Pt using pure wick    Pt states she still can't "get timing right"  - Dispo:  Continue with therapy  Await insurance approval for CIR  Medically stable for transfer     Jari Pigg, PA-C [facility] [phone] (P) [phone] Levi Aland (C) 09/14/2017, 10:04 AM

## 2017-09-14 NOTE — Consult Note (Signed)
   [facility] CM Inpatient Consult   09/14/2017  Sarah [name] [birth year] [id number]   Patient screened for extreme high risk for unplanned readmission in the [place] [facility] services. [name] in the [facility] of the [place] Management services under patient's [facility]  plan. Chart review reveals patient is being considered for CIR and has started insurance authorization.  Please contact if needs arises.   For questions contact:   Natividad Brood, RN BSN [place] [facility] Liaison  [phone] business mobile phone Toll free office [phone]

## 2017-09-14 NOTE — Plan of Care (Signed)
  Problem: Education: Goal: Knowledge of General Education information will improve Outcome: Progressing   Problem: Clinical Measurements: Goal: Will remain free from infection Outcome: Progressing   Problem: Activity: Goal: Risk for activity intolerance will decrease Outcome: Progressing   Problem: Elimination: Goal: Will not experience complications related to bowel motility Outcome: Progressing   Problem: Pain Managment: Goal: General experience of comfort will improve Outcome: Progressing   Problem: Safety: Goal: Ability to remain free from injury will improve Outcome: Progressing

## 2017-09-14 NOTE — H&P (Signed)
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  Per chart review she lives with sister and brother-in-law.  One level home with ramped entrance.  Up until recently she was using sliding board for transfers and ambulating short distances with a rolling walker up until May 2019.  She had been using a wheelchair most the time for the past few years.  Patient with 2 previous back surgeries T3 in 2015 and 2017 and did receive inpatient rehab services.  Patient with recent admission approximately 2-1/2 weeks ago for nausea and vomiting was discharged to home.  Presented 09/02/2017 with recent fall increasing leg weakness over the past 2 weeks.  Was initially to see her oncologist 08/24/2017 for MRI that was planned however could not make the appointment due to lower extremity weakness.  She did also have some urinary incontinence.  MRI and imaging revealed tumor recurrence at T3 with severe spinal cord compression.  Underwent posterior spinal tumor resection T3 09/03/2017 per Dr. Christella Noa.  Radiation oncology follow-up Dr. Dara Lords await plan for radiation therapy.  Decadron protocol as indicated.  Patient had been on Arixtra for history of DVT and pulmonary emboli.  Plan was to resume Xarelto.  Acute on chronic anemia 10.2-10.8 and monitored.  E. coli UTI initially on Rocephin and completed course of antibiotics.  Physical and occupational therapy evaluations completed patient was admitted for a comprehensive rehab program 09/08/2017.  Follow-up labs showed hemoglobin of 6.2 confirmed at 6.5 she was transfused.  Initial plan was to resume Xarelto for history of pulmonary emboli but held due to anemia.  CT of lumbar and thoracic spine as well as pelvis unremarkable.  Hemoglobin rebounded  8.7.  Patient with complaints of left knee pain and some effusion.  X-rays and imaging revealed left femoral shaft fracture likely felt to be subacute with noted history of recent fall.  Orthopedic services consulted patient was discharged to acute care services and underwent intramedullary retrograde femoral nailing 09/11/2017 per Dr. Marcelino Scot.  Nonweightbearing left lower extremity.  Patient Xarelto for history of pulmonary emboli DVT has been resumed.  Acute blood loss anemia 8.0 and monitored.  Patient with a E. coli UTI and completing course of Bactrim.  Physical and occupational therapy evaluations resumed 09/12/2017.  Recommendations ongoing for inpatient rehab services.  Patient was admitted for a comprehensive rehab program.  Review of Systems  Constitutional: Negative for chills and fever.  HENT: Negative for hearing loss.   Eyes: Negative for blurred vision and double vision.  Respiratory: Negative for cough and shortness of breath.   Cardiovascular: Positive for leg swelling. Negative for chest pain and palpitations.  Gastrointestinal: Positive for constipation and nausea.  Genitourinary:       Urinary incontinence  Musculoskeletal: Positive for joint pain and myalgias.  Skin: Negative for rash.  Neurological: Positive for sensory change and focal weakness.  Psychiatric/Behavioral: Positive for depression.  All other systems reviewed and are negative.  Past Medical History:  Diagnosis Date  . Cancer [facility])    FNA of thyroid positive for onconytic/hurthle cell carcinoma  . Chronic back pain   . DDD (degenerative disc disease), cervical   . DJD (degenerative joint disease)   . History of rectal fissure   . HIT (heparin-induced thrombocytopenia) ([place])   . Hypertension   .  Obesity   . Paraplegia at T4 level ([place])   . Pulmonary embolus, right ([place]) 2015  . Rotator cuff tendonitis right   Past Surgical History:  Procedure Laterality Date  . LAMINECTOMY N/A 12/14/2013   Procedure:  THORACIC LAMINECTOMY FOR TUMOR THORACIC THREE;  Surgeon: Ashok Pall, MD;  Location: [place] [facility];  Service: Neurosurgery;  Laterality: N/A;  THORACIC LAMINECTOMY FOR TUMOR THORACIC THREE  . LAMINECTOMY N/A 07/05/2015   Procedure: THORACIC LAMINECTOMY FOR TUMOR;  Surgeon: Ashok Pall, MD;  Location: [place] [facility];  Service: Neurosurgery;  Laterality: N/A;  . LAMINECTOMY N/A 09/03/2017   Procedure: POSTERIOR SPINAL TUMOR RESECTION THORACIC THREE;  Surgeon: Ashok Pall, MD;  Location: [place];  Service: Neurosurgery;  Laterality: N/A;  POSTERIOR SPINAL TUMOR RESECTION THORACIC THREE  . POSTERIOR LUMBAR FUSION 4 LEVEL N/A 12/30/2013   Procedure: Thoracic one-Thoracic five posterior thoracic fusion with pedicle screws;  Surgeon: Ashok Pall, MD;  Location: [place] [facility];  Service: Neurosurgery;  Laterality: N/A;  Thoracic one-Thoracic five posterior thoracic fusion with pedicle screws  . THYROIDECTOMY N/A 01/09/2014   Procedure: TOTAL THYROIDECTOMY;  Surgeon: Izora Gala, MD;  Location: [place];  Service: ENT;  Laterality: N/A;  . TONSILLECTOMY     Family History  Problem Relation Age of Onset  . Hypertension Mother   . Diabetes Mother   . Hypertension Father   . Stroke Father   . Diabetes Father   . Diabetes Sister   . Diabetes Sister    Social History:  reports that she has never smoked. She has never used smokeless tobacco. She reports that she does not drink alcohol or use drugs. Allergies:  Allergies  Allergen Reactions  . Bee Venom Anaphylaxis  . Keflex [Cephalexin] Nausea And Vomiting  . Tramadol Nausea And Vomiting  . Gabapentin Nausea And Vomiting  . Heparin Other (See Comments)    Weak positive platelets induced antibodies, SRA negative--2015  . Hydrochlorothiazide Nausea And Vomiting  . Hydrocodone Nausea And Vomiting  . Oxycodone-Acetaminophen Nausea And Vomiting   Medications Prior to Admission  Medication Sig Dispense Refill  . acetaminophen (TYLENOL) 325 MG tablet Take 2  tablets (650 mg total) by mouth every 4 (four) hours as needed for mild pain ((score 1 to 3) or temp > 100.5).    . baclofen (LIORESAL) 20 MG tablet TAKE (1) TABLET THREE TIMES DAILY. 90 tablet 1  . bisacodyl (DULCOLAX) 10 MG suppository Place 1 suppository (10 mg total) rectally at bedtime. 12 suppository 0  . dexamethasone (DECADRON) 4 MG tablet Take 1 tablet (4 mg total) by mouth every 6 (six) hours.    . diazepam (VALIUM) 5 MG tablet Take 1 tablet (5 mg total) by mouth every 6 (six) hours as needed for muscle spasms. 30 tablet 0  . fluticasone (FLONASE) 50 MCG/ACT nasal spray Place 2 sprays into both nostrils daily. 16 g 6  . furosemide (LASIX) 20 MG tablet Take 1 tablet (20 mg total) by mouth daily as needed. (Patient taking differently: Take 20 mg by mouth daily as needed for fluid. ) 30 tablet 1  . HYDROcodone-acetaminophen (NORCO/VICODIN) 5-325 MG tablet Take 1-2 tablets by mouth every 4 (four) hours as needed for moderate pain. 30 tablet 0  . levothyroxine (SYNTHROID, LEVOTHROID) 137 MCG tablet Take 137 mcg by mouth daily before breakfast.    . lisinopril (PRINIVIL,ZESTRIL) 20 MG tablet Take 2 tablets (40 mg total) by mouth daily. 60 tablet 1  . ondansetron (ZOFRAN) 4 MG tablet Take 1  tablet (4 mg total) by mouth every 6 (six) hours. 30 tablet 5  . pantoprazole (PROTONIX) 40 MG tablet Take 1 tablet (40 mg total) by mouth daily. 30 tablet 2  . senna (SENOKOT) 8.6 MG TABS tablet Take 2 tablets (17.2 mg total) by mouth daily. 120 each 0  . sertraline (ZOLOFT) 25 MG tablet Take 1 tablet (25 mg total) by mouth daily. 30 tablet 3  . sulfamethoxazole-trimethoprim (BACTRIM DS,SEPTRA DS) 800-160 MG tablet Take 1 tablet by mouth every 12 (twelve) hours.    . traZODone (DESYREL) 50 MG tablet Take 1 tablet (50 mg total) by mouth at bedtime as needed for sleep.      Drug Regimen Review Drug regimen was reviewed and remains appropriate with no significant issues identified  Home: Home  Living Family/patient expects to be discharged to:: Inpatient rehab   Functional History: Prior Function Level of Independence: Needs assistance Gait / Transfers Assistance Needed: Prior to tumor resection, was performing slide board transfers and short distance ambulation. At rehab, was utilizing [place] for sit<>stand.  ADL's / Homemaking Assistance Needed: Assistance for LB ADL. Able to complete UB ADL without assistance at bed level.   Functional Status:  Mobility: Bed Mobility Overal bed mobility: Needs Assistance Bed Mobility: Rolling Rolling: Mod assist, Max assist Sidelying to sit: Max assist, +2 for physical assistance Sit to sidelying: Max assist, +2 for physical assistance General bed mobility comments: Facilitated improved core strength to initiate rolling. Pt requiring mod assist to the R and max assist to the L.         ADL: ADL Overall ADL's : Needs assistance/impaired Eating/Feeding: Set up, Bed level Grooming: Set up, Bed level Upper Body Bathing: Set up, Bed level Lower Body Bathing: Maximal assistance, Bed level Upper Body Dressing : Set up, Bed level Lower Body Dressing: Total assistance, Bed level Toilet Transfer: Total assistance Toileting- Clothing Manipulation and Hygiene: Total assistance, Bed level Functional mobility during ADLs: Total assistance General ADL Comments: Pt hesitant to participate in mobility past bed mobility. She was agreeable to core strengthening activities and rolling for improved ADL participation.   Cognition: Cognition Overall Cognitive Status: Within Functional Limits for tasks assessed Orientation Level: Oriented X4 Cognition Arousal/Alertness: Awake/alert Behavior During Therapy: WFL for tasks assessed/performed Overall Cognitive Status: Within Functional Limits for tasks assessed  Physical Exam: Blood pressure 117/76, pulse 69, temperature 98.2 F (36.8 C), temperature source Oral, resp. rate 15, last menstrual  period 10/22/2016, SpO2 100 %. Physical Exam  Constitutional: She is oriented to person, place, and time. She appears well-developed. No distress.  HENT:  Head: Normocephalic.  Eyes: Pupils are equal, round, and reactive to light.  Neck: Normal range of motion.  Cardiovascular: Normal rate. Exam reveals no friction rub.  No murmur heard. Respiratory: Effort normal. No respiratory distress. She has no wheezes.  GI: Soft. Bowel sounds are normal. She exhibits no distension. There is no tenderness.  Neurological: She is alert and oriented to person, place, and time. No cranial nerve deficit.  Decreased sensation below level of injury. Minimal pain sensation in LE's---doesn't feel pain from left leg wound. LE: motor 0/5 prox to distal. DTR's 3+ LE's. Clonus bilateral ankles, 2/4 resting tone.   Skin:  Left leg incision clean and intact  Psychiatric: She has a normal mood and affect. Her behavior is normal.    Results for orders placed or performed during the hospital encounter of 09/11/17 (from the past 48 hour(s))  Basic metabolic panel  Status: Abnormal   Collection Time: 09/13/17  4:22 AM  Result Value Ref Range   Sodium 137 135 - 145 mmol/L   Potassium 4.7 3.5 - 5.1 mmol/L   Chloride 105 101 - 111 mmol/L   CO2 27 22 - 32 mmol/L   Glucose, Bld 178 (H) 65 - 99 mg/dL   BUN 24 (H) 6 - 20 mg/dL   Creatinine, Ser 0.98 0.44 - 1.00 mg/dL   Calcium 7.0 (L) 8.9 - 10.3 mg/dL   GFR calc non Af Amer >60 >60 mL/min   GFR calc Af Amer >60 >60 mL/min    Comment: (NOTE) The eGFR has been calculated using the CKD EPI equation. This calculation has not been validated in all clinical situations. eGFR's persistently <60 mL/min signify possible Chronic Kidney Disease.    Anion gap 5 5 - 15    Comment: Performed at [facility] [address], [place], [place] [address]  Basic metabolic panel     Status: Abnormal   Collection Time: 09/14/17  4:07 AM  Result Value Ref Range   Sodium 136  135 - 145 mmol/L   Potassium 4.9 3.5 - 5.1 mmol/L    Comment: SLIGHT HEMOLYSIS   Chloride 106 101 - 111 mmol/L   CO2 27 22 - 32 mmol/L   Glucose, Bld 189 (H) 65 - 99 mg/dL   BUN 27 (H) 6 - 20 mg/dL   Creatinine, Ser 1.02 (H) 0.44 - 1.00 mg/dL   Calcium 7.2 (L) 8.9 - 10.3 mg/dL   GFR calc non Af Amer >60 >60 mL/min   GFR calc Af Amer >60 >60 mL/min    Comment: (NOTE) The eGFR has been calculated using the CKD EPI equation. This calculation has not been validated in all clinical situations. eGFR's persistently <60 mL/min signify possible Chronic Kidney Disease.    Anion gap 3 (L) 5 - 15    Comment: Performed at [facility], [place] [address], [place], [place] [address]   No results found.     Medical Problem List and Plan: 1.  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 Course complicated by distal femur fracture requiring ORIF  -admit to inpatient rehab 2.  DVT Prophylaxis/Anticoagulation: Chronic Xarelto for history of pulmonary emboli DVT 3. Pain Management: Baclofen 20 mg 3 times daily, hydrocodone as needed for pain as well as Valium for muscle spasms 4. Mood: Zoloft 25 mg daily 5. Neuropsych: This patient is capable of making decisions on her own behalf. 6. Skin/Wound Care: Routine skin checks 7. Fluids/Electrolytes/Nutrition: Routine in and outs with follow-up chemistries 8.  Left femoral shaft fracture/subacute from recent fall.  Status post intramedullary retrograde femoral nailing 09/11/2017.  Nonweightbearing 9.  Acute on chronic anemia.  Follow-up CBC 10.  Hypertension.  Lisinopril 40 mg daily.  Monitor with increased mobility 11.  Hypothyroidism.  Synthroid 12.  Neurogenic bowel and bladder.  Check PVRs.  Complete course of Bactrim for E. coli UTI.  -bowel program   -I/O cath schedule  -needs bowel movement this evening----sorbitol  Post Admission Physician  Evaluation: 1. Functional deficits secondary  to thoracic SCI, left femur fx. 2. [name] admitted to receive collaborative, interdisciplinary care between the physiatrist, rehab nursing staff, and therapy team. 3. Patient's level of medical complexity and substantial therapy needs in context of that medical necessity cannot be provided at a lesser intensity of care such as a  SNF. 4. Patient has experienced substantial functional loss from his/her baseline which was documented above under the "Functional History" and "Functional Status" headings.  Judging by the patient's diagnosis, physical exam, and functional history, the patient has potential for functional progress which will result in measurable gains while on inpatient rehab.  These gains will be of substantial and practical use upon discharge  in facilitating mobility and self-care at the household level. 5. Physiatrist will provide 24 hour management of medical needs as well as oversight of the therapy plan/treatment and provide guidance as appropriate regarding the interaction of the two. 6. The Preadmission Screening has been reviewed and patient status is unchanged unless otherwise stated above. 7. 24 hour rehab nursing will assist with bladder management, bowel management, safety, skin/wound care, disease management, medication administration, pain management and patient education  and help integrate therapy concepts, techniques,education, etc. 8. PT will assess and treat for/with: Lower extremity strength, range of motion, stamina, balance, functional mobility, safety, adaptive techniques and equipment, NMR, family ed.   Goals are: mod I to supervision at w/c level. 9. OT will assess and treat for/with: ADL's, functional mobility, safety, upper extremity strength, adaptive techniques and equipment, NMR, family ed.   Goals are: mod I to min assist goals. Therapy may proceed with showering this patient. 10. SLP will assess and treat for/with:  n/a.  Goals are: n/a. 11. Case Management and Social Worker will assess and treat for psychological issues and discharge planning. 12. Team conference will be held weekly to assess progress toward goals and to determine barriers to discharge. 13. Patient will receive at least 3 hours of therapy per day at least 5 days per week. 14. ELOS: 20-27 days       15. Prognosis:  excellent    I have personally performed a face to face diagnostic evaluation of this patient and formulated the key components of the plan.  Additionally, I have personally reviewed laboratory data, imaging studies, as well as relevant notes and concur with the physician assistant's documentation above.  [name], MD, FAAPMR   Lavon Paganini [place], PA-C 09/14/2017

## 2017-09-14 NOTE — Care Management Important Message (Signed)
Important Message  Patient Details  Name: Sarah [name] MRN: [id number] Date of Birth: Dec 17, 1965   Medicare Important Message Given:  Yes    Orbie Pyo 09/14/2017, 4:01 PM

## 2017-09-15 ENCOUNTER — Inpatient Hospital Stay (HOSPITAL_COMMUNITY)
Admission: RE | Admit: 2017-09-15 | Discharge: 2017-09-16 | DRG: 560 | Disposition: A | Discharge status: Other Acute Inpatient Hospital | Payer: Medicare Other | Source: Intra-hospital | Attending: Physical Medicine & Rehabilitation | Admitting: Physical Medicine & Rehabilitation

## 2017-09-15 ENCOUNTER — Inpatient Hospital Stay (HOSPITAL_COMMUNITY): Payer: Medicare Other

## 2017-09-15 ENCOUNTER — Other Ambulatory Visit: Payer: Self-pay

## 2017-09-15 DIAGNOSIS — Z981 Arthrodesis status: Secondary | ICD-10-CM | POA: Diagnosis not present

## 2017-09-15 DIAGNOSIS — E039 Hypothyroidism, unspecified: Secondary | ICD-10-CM | POA: Diagnosis not present

## 2017-09-15 DIAGNOSIS — G9520 Unspecified cord compression: Secondary | ICD-10-CM | POA: Diagnosis present

## 2017-09-15 DIAGNOSIS — Z86711 Personal history of pulmonary embolism: Secondary | ICD-10-CM

## 2017-09-15 DIAGNOSIS — Z6832 Body mass index (BMI) 32.0-32.9, adult: Secondary | ICD-10-CM

## 2017-09-15 DIAGNOSIS — Z79890 Hormone replacement therapy (postmenopausal): Secondary | ICD-10-CM

## 2017-09-15 DIAGNOSIS — S72302D Unspecified fracture of shaft of left femur, subsequent encounter for closed fracture with routine healing: Secondary | ICD-10-CM

## 2017-09-15 DIAGNOSIS — C73 Malignant neoplasm of thyroid gland: Secondary | ICD-10-CM | POA: Diagnosis present

## 2017-09-15 DIAGNOSIS — E890 Postprocedural hypothyroidism: Secondary | ICD-10-CM | POA: Diagnosis present

## 2017-09-15 DIAGNOSIS — G8220 Paraplegia, unspecified: Secondary | ICD-10-CM | POA: Diagnosis not present

## 2017-09-15 DIAGNOSIS — K592 Neurogenic bowel, not elsewhere classified: Secondary | ICD-10-CM | POA: Diagnosis not present

## 2017-09-15 DIAGNOSIS — C7951 Secondary malignant neoplasm of bone: Secondary | ICD-10-CM | POA: Diagnosis not present

## 2017-09-15 DIAGNOSIS — D62 Acute posthemorrhagic anemia: Secondary | ICD-10-CM | POA: Diagnosis present

## 2017-09-15 DIAGNOSIS — Z833 Family history of diabetes mellitus: Secondary | ICD-10-CM | POA: Diagnosis not present

## 2017-09-15 DIAGNOSIS — Z823 Family history of stroke: Secondary | ICD-10-CM

## 2017-09-15 DIAGNOSIS — Z86718 Personal history of other venous thrombosis and embolism: Secondary | ICD-10-CM | POA: Diagnosis not present

## 2017-09-15 DIAGNOSIS — Z4789 Encounter for other orthopedic aftercare: Principal | ICD-10-CM

## 2017-09-15 DIAGNOSIS — N319 Neuromuscular dysfunction of bladder, unspecified: Secondary | ICD-10-CM | POA: Diagnosis not present

## 2017-09-15 DIAGNOSIS — E669 Obesity, unspecified: Secondary | ICD-10-CM | POA: Diagnosis present

## 2017-09-15 DIAGNOSIS — R109 Unspecified abdominal pain: Secondary | ICD-10-CM

## 2017-09-15 DIAGNOSIS — G8929 Other chronic pain: Secondary | ICD-10-CM | POA: Diagnosis present

## 2017-09-15 DIAGNOSIS — K5900 Constipation, unspecified: Secondary | ICD-10-CM | POA: Diagnosis present

## 2017-09-15 DIAGNOSIS — W19XXXD Unspecified fall, subsequent encounter: Secondary | ICD-10-CM | POA: Diagnosis present

## 2017-09-15 DIAGNOSIS — R112 Nausea with vomiting, unspecified: Secondary | ICD-10-CM | POA: Diagnosis present

## 2017-09-15 DIAGNOSIS — Z79899 Other long term (current) drug therapy: Secondary | ICD-10-CM

## 2017-09-15 DIAGNOSIS — Z7952 Long term (current) use of systemic steroids: Secondary | ICD-10-CM

## 2017-09-15 DIAGNOSIS — I10 Essential (primary) hypertension: Secondary | ICD-10-CM | POA: Diagnosis present

## 2017-09-15 DIAGNOSIS — Z7901 Long term (current) use of anticoagulants: Secondary | ICD-10-CM

## 2017-09-15 DIAGNOSIS — Z8249 Family history of ischemic heart disease and other diseases of the circulatory system: Secondary | ICD-10-CM | POA: Diagnosis not present

## 2017-09-15 LAB — CBC WITH DIFFERENTIAL/PLATELET
ABS IMMATURE GRANULOCYTES: 0.4 10*3/uL — AB (ref 0.0–0.1)
Basophils Absolute: 0 10*3/uL (ref 0.0–0.1)
Basophils Relative: 0 %
Eosinophils Absolute: 0 10*3/uL (ref 0.0–0.7)
Eosinophils Relative: 0 %
HEMATOCRIT: 26.2 % — AB (ref 36.0–46.0)
Hemoglobin: 8.4 g/dL — ABNORMAL LOW (ref 12.0–15.0)
IMMATURE GRANULOCYTES: 3 %
LYMPHS ABS: 0.8 10*3/uL (ref 0.7–4.0)
Lymphocytes Relative: 7 %
MCH: 30.7 pg (ref 26.0–34.0)
MCHC: 32.1 g/dL (ref 30.0–36.0)
MCV: 95.6 fL (ref 78.0–100.0)
MONOS PCT: 6 %
Monocytes Absolute: 0.7 10*3/uL (ref 0.1–1.0)
NEUTROS ABS: 9.5 10*3/uL — AB (ref 1.7–7.7)
NEUTROS PCT: 84 %
PLATELETS: 176 10*3/uL (ref 150–400)
RBC: 2.74 MIL/uL — ABNORMAL LOW (ref 3.87–5.11)
RDW: 17.1 % — ABNORMAL HIGH (ref 11.5–15.5)
WBC: 11.4 10*3/uL — ABNORMAL HIGH (ref 4.0–10.5)

## 2017-09-15 LAB — APTT: aPTT: 24 seconds (ref 24–36)

## 2017-09-15 LAB — PROTIME-INR
INR: 1.17
PROTHROMBIN TIME: 14.8 s (ref 11.4–15.2)

## 2017-09-15 LAB — TSH: TSH: 0.036 u[IU]/mL — ABNORMAL LOW (ref 0.350–4.500)

## 2017-09-15 LAB — T4, FREE: Free T4: 0.9 ng/dL (ref 0.82–1.77)

## 2017-09-15 MED ORDER — BISACODYL 10 MG RE SUPP: 10.0000 mg | Freq: Every day | RECTAL | Status: DC | PRN

## 2017-09-15 MED ORDER — ONDANSETRON HCL 4 MG PO TABS
4.0000 mg | ORAL_TABLET | Freq: Four times a day (QID) | ORAL | Status: DC | PRN
  Administered 2017-09-15: 4 mg via ORAL
  Filled 2017-09-15: qty 1

## 2017-09-15 MED ORDER — SERTRALINE HCL 50 MG PO TABS: 25.0000 mg | ORAL_TABLET | Freq: Every day | ORAL | Status: DC

## 2017-09-15 MED ORDER — BACLOFEN 20 MG PO TABS: 20.0000 mg | ORAL_TABLET | Freq: Three times a day (TID) | ORAL | Status: DC

## 2017-09-15 MED ORDER — DEXAMETHASONE SODIUM PHOSPHATE 10 MG/ML IJ SOLN: 4.0000 mg | Freq: Four times a day (QID) | INTRAMUSCULAR | Status: DC

## 2017-09-15 MED ORDER — DOCUSATE SODIUM 100 MG PO CAPS: 100.0000 mg | ORAL_CAPSULE | Freq: Two times a day (BID) | ORAL | Status: DC

## 2017-09-15 MED ORDER — MORPHINE SULFATE (PF) 2 MG/ML IV SOLN
1.0000 mg | INTRAVENOUS | Status: DC | PRN
  Administered 2017-09-15: 1 mg via INTRAVENOUS

## 2017-09-15 MED ORDER — HEPARIN (PORCINE) IN NACL 100-0.45 UNIT/ML-% IJ SOLN
1200.0000 [IU]/h | INTRAMUSCULAR | Status: DC
  Administered 2017-09-16: 1400 [IU]/h via INTRAVENOUS
  Administered 2017-09-16: 1200 [IU]/h via INTRAVENOUS
  Filled 2017-09-15 (×2): qty 250

## 2017-09-15 MED ORDER — DEXAMETHASONE 4 MG PO TABS: 4.0000 mg | ORAL_TABLET | Freq: Four times a day (QID) | ORAL | Status: DC

## 2017-09-15 MED ORDER — TRAZODONE HCL 50 MG PO TABS: 50.0000 mg | ORAL_TABLET | Freq: Every evening | ORAL | Status: DC | PRN

## 2017-09-15 MED ORDER — ACETAMINOPHEN 325 MG PO TABS: 325.0000 mg | ORAL_TABLET | Freq: Four times a day (QID) | ORAL | Status: DC | PRN

## 2017-09-15 MED ORDER — ARGATROBAN 50 MG/50ML IV SOLN
1.0000 ug/kg/min | INTRAVENOUS | Status: DC
  Filled 2017-09-15: qty 50

## 2017-09-15 MED ORDER — BENZOCAINE 10 % MT GEL
1.0000 "application " | Freq: Four times a day (QID) | OROMUCOSAL | Status: DC | PRN
  Filled 2017-09-15: qty 9

## 2017-09-15 MED ORDER — BENZOCAINE 10 % MT GEL
1.0000 "application " | Freq: Four times a day (QID) | OROMUCOSAL | Status: DC | PRN
  Administered 2017-09-15: 1 via OROMUCOSAL
  Filled 2017-09-15: qty 9

## 2017-09-15 MED ORDER — LORAZEPAM 2 MG/ML IJ SOLN
0.5000 mg | Freq: Once | INTRAMUSCULAR | Status: AC
  Administered 2017-09-15: 0.5 mg via INTRAVENOUS
  Filled 2017-09-15: qty 1

## 2017-09-15 MED ORDER — SORBITOL 70 % SOLN
60.0000 mL | Status: AC
  Administered 2017-09-15: 60 mL via ORAL
  Filled 2017-09-15: qty 60

## 2017-09-15 MED ORDER — DIAZEPAM 5 MG PO TABS: 5.0000 mg | ORAL_TABLET | Freq: Four times a day (QID) | ORAL | Status: DC | PRN

## 2017-09-15 MED ORDER — BISACODYL 10 MG RE SUPP: 10.0000 mg | Freq: Every day | RECTAL | Status: DC

## 2017-09-15 MED ORDER — LEVOTHYROXINE SODIUM 137 MCG PO TABS
137.0000 ug | ORAL_TABLET | Freq: Every day | ORAL | Status: DC
  Filled 2017-09-15: qty 1

## 2017-09-15 MED ORDER — BISACODYL 10 MG RE SUPP
10.0000 mg | Freq: Once | RECTAL | Status: DC
  Administered 2017-09-15: 10 mg via RECTAL
  Filled 2017-09-15: qty 1

## 2017-09-15 MED ORDER — LISINOPRIL 40 MG PO TABS: 40.0000 mg | ORAL_TABLET | Freq: Every day | ORAL | Status: DC

## 2017-09-15 MED ORDER — PANTOPRAZOLE SODIUM 40 MG PO TBEC: 40.0000 mg | DELAYED_RELEASE_TABLET | Freq: Every day | ORAL | Status: DC

## 2017-09-15 MED ORDER — ONDANSETRON HCL 4 MG PO TABS: 4.0000 mg | ORAL_TABLET | Freq: Four times a day (QID) | ORAL | Status: DC

## 2017-09-15 MED ORDER — MORPHINE SULFATE (PF) 2 MG/ML IV SOLN
INTRAVENOUS | Status: AC
  Administered 2017-09-15: 1 mg via INTRAVENOUS
  Filled 2017-09-15: qty 1

## 2017-09-15 MED ORDER — FLEET ENEMA 7-19 GM/118ML RE ENEM: 1.0000 | ENEMA | Freq: Every day | RECTAL | Status: DC | PRN

## 2017-09-15 MED ORDER — HYDROCODONE-ACETAMINOPHEN 5-325 MG PO TABS
1.0000 | ORAL_TABLET | ORAL | Status: DC | PRN
  Administered 2017-09-15: 2 via ORAL
  Filled 2017-09-15: qty 2

## 2017-09-15 MED ORDER — SENNA 8.6 MG PO TABS: 2.0000 | ORAL_TABLET | Freq: Every day | ORAL | Status: DC

## 2017-09-15 MED ORDER — BISACODYL 10 MG RE SUPP: 10.0000 mg | Freq: Once | RECTAL | Status: DC

## 2017-09-15 MED ORDER — SULFAMETHOXAZOLE-TRIMETHOPRIM 800-160 MG PO TABS
1.0000 | ORAL_TABLET | Freq: Two times a day (BID) | ORAL | Status: DC
  Filled 2017-09-15 (×2): qty 1

## 2017-09-15 MED ORDER — FLUTICASONE PROPIONATE 50 MCG/ACT NA SUSP
2.0000 | Freq: Every day | NASAL | Status: DC
  Filled 2017-09-15: qty 16

## 2017-09-15 MED ORDER — ONDANSETRON HCL 4 MG/2ML IJ SOLN
4.0000 mg | Freq: Four times a day (QID) | INTRAMUSCULAR | Status: DC | PRN
  Administered 2017-09-15: 4 mg via INTRAVENOUS
  Filled 2017-09-15: qty 2

## 2017-09-15 MED ORDER — RIVAROXABAN 20 MG PO TABS: 20.0000 mg | ORAL_TABLET | Freq: Every day | ORAL | Status: DC

## 2017-09-15 MED ORDER — DEXAMETHASONE SODIUM PHOSPHATE 4 MG/ML IJ SOLN
4.0000 mg | Freq: Four times a day (QID) | INTRAMUSCULAR | Status: DC
  Administered 2017-09-15: 4 mg via INTRAVENOUS
  Filled 2017-09-15 (×2): qty 1

## 2017-09-15 MED ORDER — SULFAMETHOXAZOLE-TRIMETHOPRIM 400-80 MG/5ML IV SOLN
160.0000 mg | Freq: Two times a day (BID) | INTRAVENOUS | Status: DC
Start: 2017-09-15 — End: 2017-09-16
  Administered 2017-09-15: 160 mg via INTRAVENOUS
  Filled 2017-09-15 (×3): qty 10

## 2017-09-15 NOTE — Progress Notes (Signed)
Pt still c/o severe pain,10/10,  nausea, vomitted x1, states vomitted in xray, white mucous vomit.

## 2017-09-15 NOTE — Progress Notes (Signed)
Inpatient Rehabilitation Admissions Coordinator  I have insurance approval and can admit pt to inpt rehab today. I met with patient and she is in agreement. I contacted Ainsley Spinner, PA and he will arrange d/c. RN CM and SW made aware. I requested her nurse to obtain order for a suppository for constipation per patient's request ASAP. She is nauseated . I will make the arrangements to admit today.  Danne Baxter, RN, MSN Rehab Admissions Coordinator [phone] 09/15/2017 4:15 PM

## 2017-09-15 NOTE — Progress Notes (Signed)
Pt just vomitted about 50cc white mucous, xray here to take pt down for abdominal xray

## 2017-09-15 NOTE — Progress Notes (Addendum)
Orthopedic Trauma Service Progress Note   Patient ID: Sarah [name] MRN: [id number] DOB/AGE: 08/22/1965 52 y.o.  Subjective:  Doing ok from R leg standpoint  States she ate some hard candy about 2 days ago and cracked a tooth on her left side. Intermittent pain since then and severe at times   Awaiting CIR approval   Review of Systems  Constitutional: Negative for chills and fever.  Respiratory: Negative for shortness of breath.   Cardiovascular: Negative for chest pain and palpitations.  Gastrointestinal: Negative for abdominal pain, nausea and vomiting.    Objective:   VITALS:   Vitals:   09/14/17 0844 09/14/17 1547 09/15/17 0523 09/15/17 1343  BP: 120/73 (!) 92/54 102/64 115/73  Pulse:  77 75 68  Resp:    14  Temp:  98.5 F (36.9 C) 98.2 F (36.8 C) 98.3 F (36.8 C)  TempSrc:  Oral Oral Oral  SpO2:  99% 100% 100%    Estimated body mass index is 32.38 kg/m as calculated from the following:   Height as of 09/08/17: 5\' 11"  (1.803 m).   Weight as of 09/09/17: 105.3 kg (232 lb 2.3 oz).   Intake/Output      06/24 0701 - 06/25 0700 06/25 0701 - 06/26 0700   P.O. 120 240   I.V.     Total Intake 120 240   Urine 1000    Total Output 1000    Net -880 +240          LABS  Results for orders placed or performed during the hospital encounter of 09/11/17 (from the past 24 hour(s))  TSH     Status: Abnormal   Collection Time: 09/15/17  6:35 AM  Result Value Ref Range   TSH 0.036 (L) 0.350 - 4.500 uIU/mL  T4, free     Status: None   Collection Time: 09/15/17  6:35 AM  Result Value Ref Range   Free T4 0.90 0.82 - 1.77 ng/dL     PHYSICAL EXAM:   Gen: awake, alert, sitting up in bed, NAD, appears well HEENT: fracture tooth L upper with missing enamel, no significant inflammation noted, fracture extends to the gum line Lungs: CTA B  Cardiac: RRR Ext:       Left Lower Extremity              Dressing stable               Ext warm               Swelling controlled             No motor or sensory functions noted other than able to feel pressure              + DP pulse             Compartments are soft      Assessment/Plan:     Principal Problem:   Closed fracture of left distal femur (HCC) Active Problems:   Hurthle cell carcinoma of thyroid (HCC)   Paraplegia at T4 level [facility])   Neurogenic bowel   Type 2 diabetes mellitus without complication ([place])   Hypertension   Secondary malignant neoplasm of vertebral column (HCC)   Thoracic spine tumor   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 ([place])   Spinal cord compression due to malignant neoplasm metastatic to spine [facility])   Neurogenic bladder   Thoracic myelopathy   Obesity (BMI 30-39.9)   Anti-infectives (From admission,  onward)   Start     Dose/Rate Route Frequency Ordered Stop   09/11/17 2200  sulfamethoxazole-trimethoprim (BACTRIM DS,SEPTRA DS) 800-160 MG per tablet 1 tablet     1 tablet Oral Every 12 hours 09/11/17 1600     09/11/17 1700  ceFAZolin (ANCEF) IVPB 1 g/50 mL premix     1 g 100 mL/hr over 30 Minutes Intravenous Every 6 hours 09/11/17 1604 09/12/17 0556    .  POD/HD#: 36  52 year old female s/p fall related to thoracic tumor from metastatic cancer with left distal femur fracture   - fall    -Closed left distal third femur fracture s/p IMN             Nonweightbearing left leg             Unrestricted range of motion left hip and knee             PT and OT                         Aggressive passive range of motion to maintain joint pliability               Dressing changes as needed                         Okay to clean wounds with soap and water only                          CIR pending insurance reapproval   - fracture tooth, upper left   Symptomatic management for now  No dental coverage for the next 10 days at least at the hospital according to [facility]  pts primary dentist is in [place]   Will need evaluation by dentist/endodontist  for further tx   - Pain management:             Continue with current management   - ABL anemia/Hemodynamics             Stable   - Medical issues              Stable   - DVT/PE prophylaxis:             Xarelto    - ID:              periop abx completed   - Metabolic Bone Disease:             Check vitamin d- pending   Free t4 appropriate    - Activity:             Up with therapy    - FEN/GI prophylaxis/Foley/Lines:             Reg diet            - Dispo:             Continue with therapy             Await insurance approval for CIR             Medically stable for transfer      Jari Pigg, PA-C [facility] [phone] (P) [phone] Levi Aland (C) 09/15/2017, 3:11 PM

## 2017-09-15 NOTE — Progress Notes (Signed)
Call report to Cyril Mourning, RN will transfer pt when I hang septra ivpb, note to pharmacy to send.

## 2017-09-15 NOTE — Progress Notes (Signed)
Sarah Staggers, MD  Physician  Physical Medicine and Rehabilitation  Consult Note  Signed  Date of Service:  09/07/2017 6:14 AM       Related encounter: ED to Hosp-Admission (Discharged) from 09/02/2017 in [place]      Signed      Expand All Collapse All      Show:Clear all [x] Manual[x] Template[] Copied  Added by: [x] Angiulli, Lavon Paganini, PA-C[x] Sarah Staggers, MD   [] Hover for details        Physical Medicine and Rehabilitation Consult Reason for Consult: Decreased functional mobility Referring Physician: Dr. Christella Noa   HPI: Sarah [name] is a 52 y.o. 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  Per chart review patient lives with sister and brother-in-law.  One level home with ramped entrance.  Up until recently she was using a sliding board for transfers and ambulating short distances with a rolling walker up until May 2019.  She had been using a  wheelchair most of the time for the past 3 years.  Patient has had 2 previous back surgeries at [facility] in 2015 and 2017.  Patient with recent admission approximately 2-1/2 weeks ago for nausea and vomiting was later discharged to home.  Presented 09/02/2017 with recent fall and increasing leg weakness over the past 2 weeks.  Patient initially was to see her oncologist 08/24/2017 for MRI that was planned however could not make the appointment due to lower extremity weakness.  She did report some urinary incontinence.  MRI and imaging revealed tumor recurrence at [facility] with severe spinal cord compression.  Underwent posterior spinal tumor resection [facility] 09/03/2017 per Dr. Christella Noa.  Hospital course pain management.  Radiation oncology follow-up Dr. Dara Lords await possible plan for need for radiation therapy.  Decadron protocol as indicated.  Physical and occupational therapy evaluations completed  with recommendations of physical medicine rehab consult.   Review of Systems  Constitutional: Negative for fever.  HENT: Negative for hearing loss.   Eyes: Negative for blurred vision and double vision.  Respiratory: Negative for cough and shortness of breath.   Cardiovascular: Positive for leg swelling. Negative for chest pain.  Gastrointestinal: Positive for constipation.  Genitourinary:       Urinary incontinence  Musculoskeletal: Positive for joint pain and myalgias.  Skin: Negative for rash.  Neurological: Positive for sensory change and focal weakness.  All other systems reviewed and are negative.      Past Medical History:  Diagnosis Date  . Cancer [facility])    FNA of thyroid positive for onconytic/hurthle cell carcinoma  . Chronic back pain   . DDD (degenerative disc disease), cervical   . DJD (degenerative joint disease)   . History of rectal fissure   . HIT (heparin-induced thrombocytopenia) ([place])   . Hypertension   . Obesity   . Paraplegia at T4 level ([place])   . Pulmonary embolus, right ([place]) 2015  . Rotator cuff tendonitis right        Past Surgical History:  Procedure Laterality Date  . LAMINECTOMY N/A 12/14/2013   Procedure: THORACIC LAMINECTOMY FOR TUMOR THORACIC THREE;  Surgeon: Ashok Pall, MD;  Location: [place] NEURO ORS;  Service: Neurosurgery;  Laterality: N/A;  THORACIC LAMINECTOMY FOR TUMOR THORACIC THREE  . LAMINECTOMY N/A 07/05/2015   Procedure: THORACIC LAMINECTOMY FOR TUMOR;  Surgeon: Ashok Pall, MD;  Location: [place] NEURO ORS;  Service: Neurosurgery;  Laterality: N/A;  .  LAMINECTOMY N/A 09/03/2017   Procedure: POSTERIOR SPINAL TUMOR RESECTION THORACIC THREE;  Surgeon: Ashok Pall, MD;  Location: [place];  Service: Neurosurgery;  Laterality: N/A;  POSTERIOR SPINAL TUMOR RESECTION THORACIC THREE  . POSTERIOR LUMBAR FUSION 4 LEVEL N/A 12/30/2013   Procedure: Thoracic one-Thoracic five posterior thoracic fusion with pedicle screws;  Surgeon: Ashok Pall, MD;  Location: [place] NEURO ORS;  Service: Neurosurgery;  Laterality: N/A;  Thoracic one-Thoracic five posterior thoracic fusion with pedicle screws  . THYROIDECTOMY N/A 01/09/2014   Procedure: TOTAL THYROIDECTOMY;  Surgeon: Izora Gala, MD;  Location: [place];  Service: ENT;  Laterality: N/A;  . TONSILLECTOMY          Family History  Problem Relation Age of Onset  . Hypertension Mother   . Diabetes Mother   . Hypertension Father   . Stroke Father   . Diabetes Father   . Diabetes Sister   . Diabetes Sister    Social History:  reports that she has never smoked. She has never used smokeless tobacco. She reports that she does not drink alcohol or use drugs. Allergies:       Allergies  Allergen Reactions  . Bee Venom Anaphylaxis  . Keflex [Cephalexin] Nausea And Vomiting  . Tramadol Nausea And Vomiting  . Gabapentin Nausea And Vomiting  . Heparin Other (See Comments)    Weak positive platelets induced antibodies, SRA negative--2015  . Hydrochlorothiazide Nausea And Vomiting  . Hydrocodone Nausea And Vomiting  . Oxycodone-Acetaminophen Nausea And Vomiting         Medications Prior to Admission  Medication Sig Dispense Refill  . baclofen (LIORESAL) 20 MG tablet TAKE (1) TABLET THREE TIMES DAILY. 90 tablet 1  . colchicine 0.6 MG tablet TAKE&nbsp;&nbsp;(1)&nbsp;&nbsp;TABLET TWICE A DAY AS NEEDED. 30 tablet 3  . fluticasone (FLONASE) 50 MCG/ACT nasal spray Place 2 sprays into both nostrils daily. 16 g 6  . furosemide (LASIX) 20 MG tablet Take 1 tablet (20 mg total) by mouth daily as needed. (Patient taking differently: Take 20 mg by mouth daily as needed for fluid. ) 30 tablet 1  . levothyroxine (SYNTHROID, LEVOTHROID) 137 MCG tablet Take 137 mcg by mouth daily before breakfast.    . lisinopril (PRINIVIL,ZESTRIL) 20 MG tablet Take 2 tablets (40 mg total) by mouth daily. 60 tablet 1  . NIFEdipine (PROCARDIA XL) 30 MG 24 hr tablet Take 1 tablet (30 mg total) by mouth  daily. 30 tablet 1  . ondansetron (ZOFRAN) 4 MG tablet Take 1 tablet (4 mg total) by mouth every 6 (six) hours. 30 tablet 5  . pantoprazole (PROTONIX) 40 MG tablet Take 1 tablet (40 mg total) by mouth daily. 30 tablet 2  . rivaroxaban (XARELTO) 20 MG TABS tablet Take 1 tablet (20 mg total) by mouth daily with supper. Hold 3 days prior to cholecystectomy 30 tablet 3  . senna (SENOKOT) 8.6 MG TABS tablet Take 2 tablets (17.2 mg total) by mouth daily. 120 each 0  . sertraline (ZOLOFT) 25 MG tablet Take 1 tablet (25 mg total) by mouth daily. 30 tablet 3  . fondaparinux (ARIXTRA) 2.5 MG/0.5ML SOLN injection Inject 0.5 mLs (2.5 mg total) into the skin daily at 6 (six) AM. (Patient not taking: Reported on 09/02/2017) 2.5 mL 3    Home: Home Living Family/patient expects to be discharged to:: Inpatient rehab Living Arrangements: Other relatives Available Help at Discharge: Available PRN/intermittently Type of Home: House Home Access: Ramped entrance Home Layout: One level Bathroom Shower/Tub: Tub/shower unit(pt unable  to access BR) Bathroom Toilet: Handicapped height(pt unable to access BR) Bathroom Accessibility: No Home Equipment: Environmental consultant - 2 wheels, Bedside commode, Shower seat, Wheelchair - power, Wheelchair - manual, Hospital bed, Other (comment)(sliding board) Additional Comments: Family reports that manual chair is falling apart  Functional History: Prior Function Level of Independence: Needs assistance Gait / Transfers Assistance Needed: Slide board transfers and ambulating short distances with RW prior to decline ADL's / Homemaking Assistance Needed: Pt performs bed/sink baths. Family performed cooking/cleaning tasks Comments: Pt uses manual chair most often for past 3 years. Pt was using RW for short distances with therapy only  until May 2019. Functional Status:  Mobility: Bed Mobility Overal bed mobility: Needs Assistance Bed Mobility: Supine to Sit Supine to sit: +2 for physical  assistance, Max assist General bed mobility comments: pt up in chair  Transfers Overall transfer level: Needs assistance Equipment used: None Transfers: Squat Pivot Transfers Squat pivot transfers: +2 physical assistance, Max assist General transfer comment: unable to safely attempt with +1 assist   ADL: ADL Overall ADL's : Needs assistance/impaired Eating/Feeding: Independent Grooming: Wash/dry hands, Wash/dry face, Oral care, Brushing hair, Set up, Sitting(supported sitting) Upper Body Bathing: Set up, Sitting(supported sitting ) Lower Body Bathing: Maximal assistance, Bed level, Sitting/lateral leans Upper Body Dressing : Minimal assistance, Sitting Lower Body Dressing: Total assistance, Sit to/from stand Toilet Transfer: Total assistance, BSC Toileting- Clothing Manipulation and Hygiene: Total assistance, Bed level Functional mobility during ADLs: Total assistance General ADL Comments: Pt T4 para.  Requires UE support to maintain unsupported sitting, and unable to access feet   Cognition: Cognition Overall Cognitive Status: Within Functional Limits for tasks assessed Orientation Level: Oriented X4 Cognition Arousal/Alertness: Awake/alert Behavior During Therapy: WFL for tasks assessed/performed Overall Cognitive Status: Within Functional Limits for tasks assessed  Blood pressure (!) 156/87, pulse 68, temperature 97.8 F (36.6 C), temperature source Oral, resp. rate 16, height 5' 10.98" (1.803 m), weight 99 kg (218 lb 4.1 oz), last menstrual period 10/22/2016, SpO2 100 %. Physical Exam  Constitutional: She is oriented to person, place, and time. She appears well-developed.  HENT:  Head: Normocephalic.  Eyes: Pupils are equal, round, and reactive to light.  Neck: Normal range of motion.  Cardiovascular: Normal rate.  Respiratory: Effort normal.  GI: Soft.  Musculoskeletal: She exhibits edema.  Neurological: She is alert and oriented to person, place, and time. No  cranial nerve deficit.  UE 5/5. B/L LE 0/5 no resting tone today. Sensory tr to 1/2 bilateral LE's. Right heel cord a bit tight.  Skin:  Scattered abrasions/bruises on both legs    LabResultsLast24Hours  No results found for this or any previous visit (from the past 24 hour(s)).   ImagingResults(Last48hours)  No results found.     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 Does the need for close, 24 hr/day medical supervision in concert with the patient's rehab needs make it unreasonable for this patient to be served in a less intensive setting? Yes 2. Co-Morbidities requiring supervision/potential complications: pain mgt, wound care, neurogenic bowel and bladder 3. Due to bladder management, bowel management, safety, skin/wound care, disease management, medication administration, pain management and patient education, does the patient require 24 hr/day rehab nursing? Yes 4. Does the patient require coordinated care of a physician, rehab nurse, PT (1-2 hrs/day, 5 days/week) and OT (1-2 hrs/day, 5 days/week) to address physical and functional deficits in the context of the above medical diagnosis(es)? Yes Addressing deficits  in the following areas: balance, endurance, locomotion, strength, transferring, bowel/bladder control, bathing, dressing, feeding, grooming, toileting and psychosocial support 5. Can the patient actively participate in an intensive therapy program of at least 3 hrs of therapy per day at least 5 days per week? Yes 6. The potential for patient to make measurable gains while on inpatient rehab is excellent 7. Anticipated functional outcomes upon discharge from inpatient rehab are modified independent  with PT, modified independent, supervision and min assist with OT, n/a with SLP. Likely wheel chair level goals.  8. Estimated rehab length of stay to reach the above functional goals is: 20-24  days 9. Anticipated D/C setting: Home 10. Anticipated post D/C treatments: HH therapy and Outpatient therapy 11. Overall Rehab/Functional Prognosis: good  RECOMMENDATIONS: This patient's condition is appropriate for continued rehabilitative care in the following setting: CIR Patient has agreed to participate in recommended program. Yes Note that insurance prior authorization may be required for reimbursement for recommended care.   Comment: Pt is very well known to me. She is extremely motivated and has proven very resilient over the years. Rehab Admissions Coordinator to follow up.  Thanks,  Sarah Staggers, MD, Mellody Drown  I have personally performed a face to face diagnostic evaluation of this patient. Additionally, I have reviewed and concur with the physician assistant's documentation above.       Lavon Paganini Angiulli, PA-C 09/07/2017          Revision History                   Routing History

## 2017-09-15 NOTE — Progress Notes (Signed)
Received a call from [facility], stating [name] complaining of abdominal pain and vomiting, also reports [name] family stated she has been vomiting and complaining of abdominal pain all day.She arrived to Rehab at 18:30 with the above complaints. Abdominal X-ray ordered, she was given Reglan. Awaiting X-ray results. H&P was reviewed.  This Provider was called by Di Kindle RN at 20:15, reporting [name] still vomiting, IV was placed, IV fluids ordered. A call was placed to [facility], spoke with Dr. Chyrl Civatte regarding the above, he will assess [name]. He is aware [name] last dose of Xarelto was on 09/14/2017 at 18:00 . Also called the pharmacist and spoke with Rod Holler, the decadron conversion remains the same, 4 mg IV and bactrim conversion 160 mg IV Q 12 hours.   Will await the results of X-ray and Dr. Chyrl Civatte assessment. Di Kindle RN is aware of the above.

## 2017-09-15 NOTE — PMR Pre-admission (Signed)
PMR Admission Coordinator Pre-Admission Assessment  Patient: Sarah [name] is an 52 y.o., female MRN: [id number] DOB: 1965/09/15 Height: 5\' 11"  (180.3 cm) Weight:                Insurance Information HMO: yes    PPO:      PCP:      IPA:      80/20:      OTHER: medicare advantage plan PRIMARY: U[employer] Medicare      Policy#: [id number]      Subscriber: pt CM Name: Orvan July       Phone#: [phone]     Fax#: [phone] Pre-Cert#: [id number] approved for 7 days with f/u Vevelyn Royals phone [phone] fax [phone]      Employer: retired Benefits:  Phone #: [phone]     Name:  Eff. Date: 03/24/2017     Deduct: none      Out of Pocket Max: [phone]      Life Max: none CIR: $430 cop ay per day days 1 until 4      SNF: no co pay days 1 until 20; $160 co pay per day days 21-62; no co pay days 63 until 100 Outpatient: $40 co pay per visit     Co-Pay: visits per medical neccesity Home Health: 100%      Co-Pay: visits per medical neccesity DME: 80%     Co-Pay: 20% Providers: in network  SECONDARY: none      Medicaid Application Date:       Case Manager:  Disability Application Date:       Case Worker:   Emergency Contact Information Contact Information    Name Relation Home Work Mobile   Grand,[name] Mother [phone]  [phone]   Ezzard Flax [phone]       Current Medical History  Patient Admitting Diagnosis:  Left distal femur fracture; paraplegia T4  History of Present Illness:  HPI: Sarah [name] is a 52 year old right-handed female with history of hypertension, pulmonary emboli maintained on Xarelto, 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  Patient with 2 previous back surgeries T3 in 2015 and 2017 and did receive inpatient rehab services.  Patient with recent admission approximately 2-1/2 weeks ago for nausea and vomiting was discharged to home.  Presented 09/02/2017 with recent fall increasing leg weakness over the past 2 weeks.   Was initially to see her oncologist 08/24/2017 for MRI that was planned however could not make the appointment due to lower extremity weakness.  She did also have some urinary incontinence.  MRI and imaging revealed tumor recurrence at T3 with severe spinal cord compression.  Underwent posterior spinal tumor resection T3 09/03/2017 per Dr. Christella Noa.  Radiation oncology follow-up Dr. Dara Lords await plan for radiation therapy.  Decadron protocol as indicated.  Patient had been on Arixtra for history of DVT and pulmonary emboli.  Plan was to resume Xarelto.  Acute on chronic anemia 10.2-10.8 and monitored.  E. coli UTI initially on Rocephin and completed course of antibiotics.  Physical and occupational therapy evaluations completed patient was admitted for a comprehensive rehab program 09/08/2017.  Follow-up labs showed hemoglobin of 6.2 confirmed at 6.5 she was transfused.  Initial plan was to resume Xarelto for history of pulmonary emboli but held due to anemia.  CT of lumbar and thoracic spine as well as pelvis unremarkable.  Hemoglobin rebounded 8.7.  Patient with complaints of left knee pain and some effusion.  X-rays and imaging revealed  left femoral shaft fracture likely felt to be subacute with noted history of recent fall.  Orthopedic services consulted patient was discharged to acute care services and underwent intramedullary retrograde femoral nailing 09/11/2017 per Dr. Marcelino Scot.  Nonweightbearing left lower extremity.  Patient Xarelto for history of pulmonary emboli DVT has been resumed.  Acute blood loss anemia 8.0 and monitored.  Patient with a E. coli UTI and completing course of Bactrim.    Past Medical History  Past Medical History:  Diagnosis Date  . Cancer [facility])    FNA of thyroid positive for onconytic/hurthle cell carcinoma  . Chronic back pain   . DDD (degenerative disc disease), cervical   . DJD (degenerative joint disease)   . Family history of adverse reaction to anesthesia    MOTHER HAS  NAUSEA   . History of rectal fissure   . HIT (heparin-induced thrombocytopenia) ([place])   . Hypertension   . Obesity   . Paraplegia at T4 level ([place])   . Pulmonary embolus, right ([place]) 2015  . Rotator cuff tendonitis right    Family History  family history includes Diabetes in her father, mother, sister, and sister; Hypertension in her father and mother; Stroke in her father.  Prior Rehab/Hospitalizations:  Has the patient had major surgery during 100 days prior to admission? No  Current Medications   Current Facility-Administered Medications:  .  0.9 % NaCl with KCl 20 mEq/ L  infusion, , Intravenous, Continuous, Ainsley Spinner, PA-C, Stopped at 09/13/17 7322 .  acetaminophen (TYLENOL) tablet 325-650 mg, 325-650 mg, Oral, Q6H PRN, Ainsley Spinner, PA-C, 650 mg at 09/12/17 1308 .  baclofen (LIORESAL) tablet 20 mg, 20 mg, Oral, TID, Ainsley Spinner, PA-C, 20 mg at 09/15/17 0254 .  benzocaine (ORAJEL) 10 % mucosal gel 1 application, 1 application, Mouth/Throat, QID PRN, Ainsley Spinner, PA-C .  bisacodyl (DULCOLAX) suppository 10 mg, 10 mg, Rectal, Once, Altamese , MD .  dexamethasone (DECADRON) tablet 4 mg, 4 mg, Oral, Q6H, Ainsley Spinner, PA-C, 4 mg at 09/15/17 1216 .  diazepam (VALIUM) tablet 5 mg, 5 mg, Oral, Q6H PRN, Ainsley Spinner, PA-C .  docusate sodium (COLACE) capsule 100 mg, 100 mg, Oral, BID, Ainsley Spinner, PA-C .  fluticasone [facility]) 50 MCG/ACT nasal spray 2 spray, 2 spray, Each Nare, QHS, Ainsley Spinner, PA-C, 2 spray at 09/14/17 2122 .  furosemide (LASIX) tablet 20 mg, 20 mg, Oral, Daily PRN, Ainsley Spinner, PA-C .  HYDROcodone-acetaminophen (NORCO) 7.5-325 MG per tablet 1-2 tablet, 1-2 tablet, Oral, Q4H PRN, Ainsley Spinner, PA-C, 1 tablet at 09/15/17 1216 .  HYDROcodone-acetaminophen (NORCO/VICODIN) 5-325 MG per tablet 1-2 tablet, 1-2 tablet, Oral, Q4H PRN, Ainsley Spinner, PA-C, 1 tablet at 09/14/17 2122 .  levothyroxine (SYNTHROID, LEVOTHROID) tablet 137 mcg, 137 mcg, Oral, QAC breakfast, Ainsley Spinner, PA-C, 137 mcg at 09/15/17 2706 .  lisinopril (PRINIVIL,ZESTRIL) tablet 40 mg, 40 mg, Oral, Daily, Ainsley Spinner, PA-C, 40 mg at 09/14/17 0850 .  metoCLOPramide (REGLAN) tablet 5-10 mg, 5-10 mg, Oral, Q8H PRN **OR** metoCLOPramide (REGLAN) injection 5-10 mg, 5-10 mg, Intravenous, Q8H PRN, Ainsley Spinner, PA-C .  morphine 2 MG/ML injection 0.5-1 mg, 0.5-1 mg, Intravenous, Q2H PRN, Ainsley Spinner, PA-C .  ondansetron [facility]) tablet 4 mg, 4 mg, Oral, Q6H PRN **OR** ondansetron (ZOFRAN) injection 4 mg, 4 mg, Intravenous, Q6H PRN, Ainsley Spinner, PA-C, 4 mg at 09/15/17 1631 .  ondansetron (ZOFRAN) tablet 4 mg, 4 mg, Oral, Q6H, Ainsley Spinner, PA-C, 4 mg at 09/15/17 1215 .  pantoprazole (PROTONIX) EC tablet  40 mg, 40 mg, Oral, Daily, Ainsley Spinner, PA-C, 40 mg at 09/15/17 1191 .  rivaroxaban (XARELTO) tablet 20 mg, 20 mg, Oral, Q supper, Karren Cobble, RPH, 20 mg at 09/14/17 1600 .  senna (SENOKOT) tablet 17.2 mg, 2 tablet, Oral, Daily, Ainsley Spinner, PA-C, 17.2 mg at 09/15/17 0820 .  sertraline (ZOLOFT) tablet 25 mg, 25 mg, Oral, Daily, Ainsley Spinner, PA-C, 25 mg at 09/15/17 0820 .  sulfamethoxazole-trimethoprim (BACTRIM DS,SEPTRA DS) 800-160 MG per tablet 1 tablet, 1 tablet, Oral, Q12H, Ainsley Spinner, PA-C, 1 tablet at 09/15/17 0819 .  traZODone (DESYREL) tablet 50 mg, 50 mg, Oral, QHS PRN, Ainsley Spinner, PA-C  Patients Current Diet:  Diet Order           Diet regular Room service appropriate? Yes; Fluid consistency: Thin  Diet effective now          Precautions / Restrictions Precautions Precautions: Fall, Back Precaution Booklet Issued: No Precaution Comments: throaic region, LEs spasicity  Restrictions Weight Bearing Restrictions: Yes LLE Weight Bearing: Non weight bearing   Has the patient had 2 or more falls or a fall with injury in the past year?No  Prior Activity Level Limited Community (1-2x/wk): went out 3 times per week; not driving  Development worker, international aid / [place]  Devices/Equipment: Wheelchair, Hospital bed, Eyeglasses Home Equipment: Environmental consultant - 2 wheels, Bedside commode, Shower seat, Wheelchair - power, Wheelchair - manual, Hospital bed, Other (comment)  Prior Device Use: Indicate devices/aids used by the patient prior to current illness, exacerbation or injury? Manual wheelchair and walker  Prior Functional Level Prior Function Level of Independence: Needs assistance Gait / Transfers Assistance Needed: Prior to tumor resection, was performing slide board transfers and short distance ambulation. At rehab, was utilizing [place] for sit<>stand.  ADL's / Homemaking Assistance Needed: Assistance for LB ADL. Able to complete UB ADL without assistance at bed level.  Comments: Pt uses manual chair most often for past 3 years. Pt was using RW for short distances with therapy only  until May 2019. Pt reports staying home most days with her nephew  Self Care: Did the patient need help bathing, dressing, using the toilet or eating?  Independent  Indoor Mobility: Did the patient need assistance with walking from room to room (with or without device)? Independent  Stairs: Did the patient need assistance with internal or external stairs (with or without device)? Independent  Functional Cognition: Did the patient need help planning regular tasks such as shopping or remembering to take medications? Independent  Current Functional Level Cognition  Arousal/Alertness: Awake/alert Overall Cognitive Status: Within Functional Limits for tasks assessed Orientation Level: Oriented X4 Comments: a definite fear of falling     Extremity Assessment (includes Sensation/Coordination)  Upper Extremity Assessment: Generalized weakness RUE Deficits / Details: pain Rt shoulder with limited active shoulder ROM (~115) due to rotator cuff tendonitis   Lower Extremity Assessment: Defer to PT evaluation(increased tone with attempted passive movement RLE) RLE Deficits / Details: pt  with AROM hip flexion and knee flexion in supine in a flexion pattern; pt with sensation to only noxious stimulus in nail bed LLE Deficits / Details: pt with no active movement of L LE throughout evaluation. Pt with some sensation to light touch on toes and foot    ADLs  Overall ADL's : Needs assistance/impaired Eating/Feeding: Set up, Bed level Grooming: Set up, Bed level Upper Body Bathing: Set up, Bed level Lower Body Bathing: Maximal assistance, Bed level Upper Body Dressing :  Set up, Bed level Lower Body Dressing: Total assistance, Bed level Toilet Transfer: Total assistance Toileting- Clothing Manipulation and Hygiene: Total assistance, Bed level Functional mobility during ADLs: Total assistance General ADL Comments: pt reports having completed ADLs earlier today; focus of session on UB strengthening within available limits given back precautions and LE PROM while seated in recliner     Mobility  Overal bed mobility: Needs Assistance Bed Mobility: Rolling Rolling: Max assist, +2 for safety/equipment Sidelying to sit: Max assist, +2 for physical assistance Sit to sidelying: Max assist, +2 for physical assistance General bed mobility comments: OOB in recliner upon arrival     Transfers  Overall transfer level: Needs assistance Equipment used: (bed pad) Transfers: Government social research officer transfers: Max assist, Total assist(+3) General transfer comment: Pt required cues hand placement to push with B hands to maintain trunk support while PTA and tech scooted patient back into recliner chair.  She required two separate scoots to maintain trunk extension.  Pt with posterior lean in sitting.      Ambulation / Gait / Stairs / Office manager / Balance Dynamic Sitting Balance Sitting balance - Comments: pt only able to tolerate sitting EOB ~1 minute and required transition back to lying in bed secondary to pain Balance Overall balance  assessment: Needs assistance Sitting-balance support: Bilateral upper extremity supported Sitting balance-Leahy Scale: Poor Sitting balance - Comments: pt only able to tolerate sitting EOB ~1 minute and required transition back to lying in bed secondary to pain Standing balance-Leahy Scale: (NT)    Special needs/care consideration BiPAP/CPAP n/a CPM n/a Continuous Drip IV n/a Dialysis  N/a Life Vest n/a Oxygen  N/a Special Bed n/a Trach Size n/a Wound Vac n/a Skin   Surgical incision; blister right thigh, ecchymosis to numerous area Bowel mgmt: LBM 09/13/17; patient complaining of nausea today; I requested RN on acute to call Lanny Hurst ,PA to request suppository today Bladder mgmt: external catheter Diabetic mgmt n/a   Previous Home Environment Living Arrangements: Other relatives  Lives With: Family Available Help at Discharge: Available PRN/intermittently Type of Home: House Home Layout: One level Home Access: Ramped entrance Bathroom Shower/Tub: Chiropodist: Handicapped height Bathroom Accessibility: No Home Care Services: No Type of Home Care Services: Biomedical scientist, [place] (if known): [place] Additional Comments: Pt reports using bedside commode  Discharge Living Setting Plans for Discharge Living Setting: House, Lives with (comment) Type of Home at Discharge: House Discharge Home Layout: Two level, Laundry or work area in basement, Able to live on main level with bedroom/bathroom Alternate Level Stairs-Number of Steps: Flight Discharge Home Access: Ramped entrance Discharge Bathroom Shower/Tub: Tub/shower unit Discharge Bathroom Toilet: Handicapped height Discharge Bathroom Accessibility: No Does the patient have any problems obtaining your medications?: No  Social/Family/Support Systems Patient Roles: Other (Comment) Contact Information: Milagro Belmares - mother - [phone] Anticipated Caregiver: Sister, brother in-law and  Mom Anticipated Caregiver's Contact Information: see above Ability/Limitations of Caregiver: Sister works part time for the post office and Mom lives 4 minutes away Caregiver Availability: Evenings only Discharge Plan Discussed with Primary Caregiver: Yes Is Caregiver In Agreement with Plan?: Yes Does Caregiver/Family have Issues with Lodging/Transportation while Pt is in Rehab?: No   Goals/Additional Needs Patient/Family Goal for Rehab: PT mod I, OT mod I to supervision to min assist goals.  Likely will be W/C level goals Expected length of stay: 20-24 days Cultural Considerations: None Dietary Needs: Regular diet, thin liquids  Equipment Needs: TBD Pt/Family Agrees to Admission and willing to participate: Yes Program Orientation Provided & Reviewed with Pt/Caregiver Including Roles  & Responsibilities: Yes   Decrease burden of Care through IP rehab admission: n/a  Possible need for SNF placement upon discharge:not anticipated  Patient Condition: Patient's condition has changed since original consult on 09/07/2017. Readmitted to acute 09/11/2017 for surgery. Case reviewed with Rehabilitation physician and patient would benefit form readmit to acute inpatient rehabilitation to complete her rehab program. She will benefit from the coordinated team approach, Rehabilitation physician and Nursing care along with physical and occupational therapy. We will readmit today.  Preadmission Screen Completed By:  Cleatrice Burke, 09/15/2017 4:37 PM ______________________________________________________________________   Discussed status with Dr. Naaman Plummer on 09/15/2017 at  1437 and received telephone approval for admission today.  Admission Coordinator:  Cleatrice Burke, time 7741 Date 09/15/2017

## 2017-09-15 NOTE — Progress Notes (Signed)
ANTICOAGULATION CONSULT NOTE - Initial Consult  Pharmacy Consult for heparin (Xarelto on hold) Indication: hx VTE  Allergies  Allergen Reactions  . Bee Venom Anaphylaxis  . Keflex [Cephalexin] Nausea And Vomiting  . Tramadol Nausea And Vomiting  . Gabapentin Nausea And Vomiting  . Heparin Other (See Comments)    Weak positive platelets induced antibodies, SRA negative--2015  . Hydrochlorothiazide Nausea And Vomiting  . Hydrocodone Nausea And Vomiting  . Oxycodone-Acetaminophen Nausea And Vomiting    Patient Measurements: Height: 5\' 11"  (180.3 cm) Weight: 233 lb 4 oz (105.8 kg) IBW/kg (Calculated) : 70.8  Heparin dosing weight: 94 kg  Vital Signs: Temp: 97.9 F (36.6 C) (06/25 2142) Temp Source: Oral (06/25 2142) BP: 142/86 (06/25 2142) Pulse Rate: 72 (06/25 2142)  Labs: Recent Labs    09/13/17 0422 09/14/17 0407 09/14/17 0848  HGB  --   --  8.0*  HCT  --   --  25.5*  PLT  --   --  166  CREATININE 0.98 1.02*  --     Estimated Creatinine Clearance: 87.4 mL/min (A) (by C-G formula based on SCr of 1.02 mg/dL (H)).   Medical History: Past Medical History:  Diagnosis Date  . Cancer [facility])    FNA of thyroid positive for onconytic/hurthle cell carcinoma  . Chronic back pain   . DDD (degenerative disc disease), cervical   . DJD (degenerative joint disease)   . Family history of adverse reaction to anesthesia    MOTHER HAS NAUSEA   . History of rectal fissure   . HIT (heparin-induced thrombocytopenia) ([place])   . Hypertension   . Obesity   . Paraplegia at T4 level ([place])   . Pulmonary embolus, right ([place]) 2015  . Rotator cuff tendonitis right    Medications:  Medications Prior to Admission  Medication Sig Dispense Refill Last Dose  . acetaminophen (TYLENOL) 325 MG tablet Take 2 tablets (650 mg total) by mouth every 4 (four) hours as needed for mild pain ((score 1 to 3) or temp > 100.5).     . baclofen (LIORESAL) 20 MG tablet TAKE (1) TABLET THREE TIMES DAILY. 90  tablet 1 09/01/2017 at Unknown time  . bisacodyl (DULCOLAX) 10 MG suppository Place 1 suppository (10 mg total) rectally at bedtime. 12 suppository 0   . dexamethasone (DECADRON) 4 MG tablet Take 1 tablet (4 mg total) by mouth every 6 (six) hours.     . diazepam (VALIUM) 5 MG tablet Take 1 tablet (5 mg total) by mouth every 6 (six) hours as needed for muscle spasms. 30 tablet 0   . fluticasone (FLONASE) 50 MCG/ACT nasal spray Place 2 sprays into both nostrils daily. 16 g 6 09/01/2017 at Unknown time  . furosemide (LASIX) 20 MG tablet Take 1 tablet (20 mg total) by mouth daily as needed. (Patient taking differently: Take 20 mg by mouth daily as needed for fluid. ) 30 tablet 1 09/01/2017 at Unknown time  . HYDROcodone-acetaminophen (NORCO/VICODIN) 5-325 MG tablet Take 1-2 tablets by mouth every 4 (four) hours as needed for moderate pain. 30 tablet 0   . levothyroxine (SYNTHROID, LEVOTHROID) 137 MCG tablet Take 137 mcg by mouth daily before breakfast.   09/01/2017 at Unknown time  . lisinopril (PRINIVIL,ZESTRIL) 20 MG tablet Take 2 tablets (40 mg total) by mouth daily. 60 tablet 1 09/01/2017 at Unknown time  . ondansetron (ZOFRAN) 4 MG tablet Take 1 tablet (4 mg total) by mouth every 6 (six) hours. 30 tablet 5 09/01/2017 at Unknown  time  . pantoprazole (PROTONIX) 40 MG tablet Take 1 tablet (40 mg total) by mouth daily. 30 tablet 2 09/01/2017 at Unknown time  . senna (SENOKOT) 8.6 MG TABS tablet Take 2 tablets (17.2 mg total) by mouth daily. 120 each 0 09/01/2017 at Unknown time  . sertraline (ZOLOFT) 25 MG tablet Take 1 tablet (25 mg total) by mouth daily. 30 tablet 3 09/01/2017 at Unknown time  . sulfamethoxazole-trimethoprim (BACTRIM DS,SEPTRA DS) 800-160 MG tablet Take 1 tablet by mouth every 12 (twelve) hours.     . traZODone (DESYREL) 50 MG tablet Take 1 tablet (50 mg total) by mouth at bedtime as needed for sleep.       Assessment: 52 y/o female with stage IV hurthle cell carcinoma of thyroid and  paraplegia at T4 level on Xarelto PTA for hx VTE. She underwent repair of a left distal femure fracture on 6/21 and was discharged to inpatient rehab. She has been vomiting since arrival and medications are being changed to IV. Pharmacy consulted to transition to argatroban. Of note, patient does NOT have a HIT allergy (SRA negative in 2015). Spoke with patient about results of HIT studies from 2015 and she stated she would be ok to use heparin. Spoke with Dr. Hal Hope who was ok to change to heparin. Last Xarelto dose was 6/24 at 16:00. SCr is normal, Hgb low stable in 7-8 range, platelets are normal.  Will monitor aPTT until heparin level and aPTT correlate since Xarelto affects the heparin level.  Goal of Therapy:  Heparin level 0.3-0.7 units/ml aPTT 66-102 seconds Monitor platelets by anticoagulation protocol: Yes   Plan:  - Begin heparin drip at 1400 units/hr with no bolus - Baseline aPTT, heparin level - 6 hr aPTT - Daily heparin level, aPTT, CBC - Monitor for s/sx of bleeding   Renold Genta, PharmD, BCPS Clinical Pharmacist Clinical phone for 09/15/2017 until 10p is [phone] Please check AMION for all Pharmacist numbers by unit 09/15/2017 9:47 PM

## 2017-09-15 NOTE — Progress Notes (Signed)
Pt has been vomitting since arrival from 5 N, call to [place] NP on call, orders for IVF and to get her other meds IV, also for pt to have additional zofran dose iv now as she had vomitted after po dose.

## 2017-09-15 NOTE — Progress Notes (Signed)
Received a call from [facility], Dr. Cyndia Diver seen Ms. Fronek she will be transferred. Discharge order given. Ms. Merlin very anxious , lorazepam ordered X1.

## 2017-09-15 NOTE — Progress Notes (Signed)
Sarah Gong, RN  Rehab Admission Coordinator  Physical Medicine and Rehabilitation  PMR Pre-admission  Signed  Date of Service:  09/15/2017 4:22 PM       Related encounter: Admission (Current) from 09/11/2017 in [facility]           Show:Clear all [x] Manual[x] Template[x] Copied  Added by: [x] Sarah Gong, RN   [] Hover for details   PMR Admission Coordinator Pre-Admission Assessment  Patient: Sarah [name] is an 52 y.o., female MRN: [id number] DOB: 06/13/1965 Height: 5\' 11"  (180.3 cm) Weight:                                                                                                                                                    Insurance Information HMO: yes    PPO:      PCP:      IPA:      80/20:      OTHER: medicare advantage plan PRIMARY: U[employer] Medicare      Policy#: [id number]      Subscriber: pt CM Name: Sarah [name]       Phone#: [phone]     Fax#: [phone] Pre-Cert#: [id number] approved for 7 days with f/u Sarah [name] phone [phone] fax [phone]      Employer: retired Benefits:  Phone #: [phone]     Name:  Eff. Date: 03/24/2017     Deduct: none      Out of Pocket Max: [phone]      Life Max: none CIR: $430 cop ay per day days 1 until 4      SNF: no co pay days 1 until 20; $160 co pay per day days 21-62; no co pay days 63 until 100 Outpatient: $40 co pay per visit     Co-Pay: visits per medical neccesity Home Health: 100%      Co-Pay: visits per medical neccesity DME: 80%     Co-Pay: 20% Providers: in network  SECONDARY: none      Medicaid Application Date:       Case Manager:  Disability Application Date:       Case Worker:   Emergency Contact Information         Contact Information    Name Relation Home Work Mobile   Sarah [name] Mother [phone]  [phone]   Sarah [name] [phone]       Current Medical History  Patient Admitting  Diagnosis:  Left distal femur fracture; paraplegia T4  History of Present Illness:  Sarah [name] is a 52 year old right-handed female with history of hypertension, pulmonary emboli maintained on Xarelto, 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 Patient with 2 previous back surgeries T3 in 2015 and 2017 and did receive inpatient rehab services. Patient with recent admission approximately 2-1/2  weeks ago for nausea and vomiting was discharged to home. Presented 09/02/2017 with recent fall increasing leg weakness over the past 2 weeks. Was initially to see her oncologist 08/24/2017 for MRI that was planned however could not make the appointment due to lower extremity weakness. She did also have some urinary incontinence. MRI and imaging revealed tumor recurrence at T3 with severe spinal cord compression. Underwent posterior spinal tumor resection T3 09/03/2017 per Sarah [name]. Radiation oncology follow-up Sarah [name] await plan for radiation therapy. Decadron protocol as indicated. Patient had been on Arixtra for history of DVT and pulmonary emboli. Plan was to resume Xarelto. Acute on chronic anemia 10.2-10.8 and monitored. E. coli UTI initially on Rocephin and completed course of antibiotics. Physical and occupational therapy evaluations completed patient was admitted for a comprehensive rehab program 09/08/2017. Follow-up labs showed hemoglobin of 6.2 confirmed at 6.5 she was transfused. Initial plan was to resume Xarelto for history of pulmonary emboli but held due to anemia. CT of lumbar and thoracic spine as well as pelvis unremarkable. Hemoglobin rebounded 8.7. Patient with complaints of left knee pain and some effusion. X-rays and imaging revealed left femoral shaft fracture likely felt to be subacute with noted history of recent fall. Orthopedic services consulted patient was discharged to acute care services and underwent intramedullary retrograde  femoral nailing 09/11/2017 per Sarah [name]. Nonweightbearing left lower extremity. Patient Xarelto for history of pulmonary emboli DVT has been resumed. Acute blood loss anemia 8.0and monitored. Patient with a E. coli UTI and completing course of Bactrim.   Past Medical History      Past Medical History:  Diagnosis Date  . Cancer [facility])    FNA of thyroid positive for onconytic/hurthle cell carcinoma  . Chronic back pain   . DDD (degenerative disc disease), cervical   . DJD (degenerative joint disease)   . Family history of adverse reaction to anesthesia    MOTHER HAS NAUSEA   . History of rectal fissure   . HIT (heparin-induced thrombocytopenia) ([place])   . Hypertension   . Obesity   . Paraplegia at T4 level ([place])   . Pulmonary embolus, right ([place]) 2015  . Rotator cuff tendonitis right    Family History  family history includes Diabetes in her father, mother, sister, and sister; Hypertension in her father and mother; Stroke in her father.  Prior Rehab/Hospitalizations:  Has the patient had major surgery during 100 days prior to admission? No  Current Medications   Current Facility-Administered Medications:  .  0.9 % NaCl with KCl 20 mEq/ L  infusion, , Intravenous, Continuous, Sarah Spinner, PA-C, Stopped at 09/13/17 2355 .  acetaminophen (TYLENOL) tablet 325-650 mg, 325-650 mg, Oral, Q6H PRN, Sarah Spinner, PA-C, 650 mg at 09/12/17 1308 .  baclofen (LIORESAL) tablet 20 mg, 20 mg, Oral, TID, Sarah Spinner, PA-C, 20 mg at 09/15/17 7322 .  benzocaine (ORAJEL) 10 % mucosal gel 1 application, 1 application, Mouth/Throat, QID PRN, Sarah Spinner, PA-C .  bisacodyl (DULCOLAX) suppository 10 mg, 10 mg, Rectal, Once, Sarah Montague, MD .  dexamethasone (DECADRON) tablet 4 mg, 4 mg, Oral, Q6H, Sarah Spinner, PA-C, 4 mg at 09/15/17 1216 .  diazepam (VALIUM) tablet 5 mg, 5 mg, Oral, Q6H PRN, Sarah Spinner, PA-C .  docusate sodium (COLACE) capsule 100 mg, 100 mg, Oral, BID, Sarah Spinner, PA-C .  fluticasone [facility]) 50 MCG/ACT nasal spray 2 spray, 2 spray, Each Nare, QHS, Sarah Spinner, PA-C, 2 spray at 09/14/17 2122 .  furosemide (LASIX) tablet 20  mg, 20 mg, Oral, Daily PRN, Sarah Spinner, PA-C .  HYDROcodone-acetaminophen (NORCO) 7.5-325 MG per tablet 1-2 tablet, 1-2 tablet, Oral, Q4H PRN, Sarah Spinner, PA-C, 1 tablet at 09/15/17 1216 .  HYDROcodone-acetaminophen (NORCO/VICODIN) 5-325 MG per tablet 1-2 tablet, 1-2 tablet, Oral, Q4H PRN, Sarah Spinner, PA-C, 1 tablet at 09/14/17 2122 .  levothyroxine (SYNTHROID, LEVOTHROID) tablet 137 mcg, 137 mcg, Oral, QAC breakfast, Sarah Spinner, PA-C, 137 mcg at 09/15/17 1962 .  lisinopril (PRINIVIL,ZESTRIL) tablet 40 mg, 40 mg, Oral, Daily, Sarah Spinner, PA-C, 40 mg at 09/14/17 0850 .  metoCLOPramide (REGLAN) tablet 5-10 mg, 5-10 mg, Oral, Q8H PRN **OR** metoCLOPramide (REGLAN) injection 5-10 mg, 5-10 mg, Intravenous, Q8H PRN, Sarah Spinner, PA-C .  morphine 2 MG/ML injection 0.5-1 mg, 0.5-1 mg, Intravenous, Q2H PRN, Sarah Spinner, PA-C .  ondansetron [facility]) tablet 4 mg, 4 mg, Oral, Q6H PRN **OR** ondansetron (ZOFRAN) injection 4 mg, 4 mg, Intravenous, Q6H PRN, Sarah Spinner, PA-C, 4 mg at 09/15/17 1631 .  ondansetron (ZOFRAN) tablet 4 mg, 4 mg, Oral, Q6H, Sarah Spinner, PA-C, 4 mg at 09/15/17 1215 .  pantoprazole (PROTONIX) EC tablet 40 mg, 40 mg, Oral, Daily, Sarah Spinner, PA-C, 40 mg at 09/15/17 2297 .  rivaroxaban (XARELTO) tablet 20 mg, 20 mg, Oral, Q supper, Karren Cobble, RPH, 20 mg at 09/14/17 1600 .  senna (SENOKOT) tablet 17.2 mg, 2 tablet, Oral, Daily, Sarah Spinner, PA-C, 17.2 mg at 09/15/17 0820 .  sertraline (ZOLOFT) tablet 25 mg, 25 mg, Oral, Daily, Sarah Spinner, PA-C, 25 mg at 09/15/17 0820 .  sulfamethoxazole-trimethoprim (BACTRIM DS,SEPTRA DS) 800-160 MG per tablet 1 tablet, 1 tablet, Oral, Q12H, Sarah Spinner, PA-C, 1 tablet at 09/15/17 0819 .  traZODone (DESYREL) tablet 50 mg, 50 mg, Oral, QHS PRN, Sarah Spinner, PA-C  Patients  Current Diet:       Diet Order           Diet regular Room service appropriate? Yes; Fluid consistency: Thin  Diet effective now          Precautions / Restrictions Precautions Precautions: Fall, Back Precaution Booklet Issued: No Precaution Comments: throaic region, LEs spasicity  Restrictions Weight Bearing Restrictions: Yes LLE Weight Bearing: Non weight bearing   Has the patient had 2 or more falls or a fall with injury in the past year?No  Prior Activity Level Limited Community (1-2x/wk): went out 3 times per week; not driving  Development worker, international aid / [place] Devices/Equipment: Wheelchair, Hospital bed, Eyeglasses Home Equipment: Environmental consultant - 2 wheels, Bedside commode, Shower seat, Wheelchair - power, Wheelchair - manual, Hospital bed, Other (comment)  Prior Device Use: Indicate devices/aids used by the patient prior to current illness, exacerbation or injury? Manual wheelchair and walker  Prior Functional Level Prior Function Level of Independence: Needs assistance Gait / Transfers Assistance Needed: Prior to tumor resection, was performing slide board transfers and short distance ambulation. At rehab, was utilizing [place] for sit<>stand.  ADL's / Homemaking Assistance Needed: Assistance for LB ADL. Able to complete UB ADL without assistance at bed level.  Comments: Pt uses manual chair most often for past 3 years. Pt was using RW for short distances with therapy only  until May 2019. Pt reports staying home most days with her nephew  Self Care: Did the patient need help bathing, dressing, using the toilet or eating?  Independent  Indoor Mobility: Did the patient need assistance with walking from room to room (with or without device)? Independent  Stairs: Did the patient  need assistance with internal or external stairs (with or without device)? Independent  Functional Cognition: Did the patient need help planning regular tasks such as  shopping or remembering to take medications? Independent  Current Functional Level Cognition  Arousal/Alertness: Awake/alert Overall Cognitive Status: Within Functional Limits for tasks assessed Orientation Level: Oriented X4 Comments: a definite fear of falling     Extremity Assessment (includes Sensation/Coordination)  Upper Extremity Assessment: Generalized weakness RUE Deficits / Details: pain Rt shoulder with limited active shoulder ROM (~115) due to rotator cuff tendonitis   Lower Extremity Assessment: Defer to PT evaluation(increased tone with attempted passive movement RLE) RLE Deficits / Details: pt with AROM hip flexion and knee flexion in supine in a flexion pattern; pt with sensation to only noxious stimulus in nail bed LLE Deficits / Details: pt with no active movement of L LE throughout evaluation. Pt with some sensation to light touch on toes and foot    ADLs  Overall ADL's : Needs assistance/impaired Eating/Feeding: Set up, Bed level Grooming: Set up, Bed level Upper Body Bathing: Set up, Bed level Lower Body Bathing: Maximal assistance, Bed level Upper Body Dressing : Set up, Bed level Lower Body Dressing: Total assistance, Bed level Toilet Transfer: Total assistance Toileting- Clothing Manipulation and Hygiene: Total assistance, Bed level Functional mobility during ADLs: Total assistance General ADL Comments: pt reports having completed ADLs earlier today; focus of session on UB strengthening within available limits given back precautions and LE PROM while seated in recliner     Mobility  Overal bed mobility: Needs Assistance Bed Mobility: Rolling Rolling: Max assist, +2 for safety/equipment Sidelying to sit: Max assist, +2 for physical assistance Sit to sidelying: Max assist, +2 for physical assistance General bed mobility comments: OOB in recliner upon arrival     Transfers  Overall transfer level: Needs assistance Equipment used: (bed  pad) Transfers: Government social research officer transfers: Max assist, Total assist(+3) General transfer comment: Pt required cues hand placement to push with B hands to maintain trunk support while PTA and tech scooted patient back into recliner chair.  She required two separate scoots to maintain trunk extension.  Pt with posterior lean in sitting.      Ambulation / Gait / Stairs / Office manager / Balance Dynamic Sitting Balance Sitting balance - Comments: pt only able to tolerate sitting EOB ~1 minute and required transition back to lying in bed secondary to pain Balance Overall balance assessment: Needs assistance Sitting-balance support: Bilateral upper extremity supported Sitting balance-Leahy Scale: Poor Sitting balance - Comments: pt only able to tolerate sitting EOB ~1 minute and required transition back to lying in bed secondary to pain Standing balance-Leahy Scale: (NT)    Special needs/care consideration BiPAP/CPAP n/a CPM n/a Continuous Drip IV n/a Dialysis  N/a Life Vest n/a Oxygen  N/a Special Bed n/a Trach Size n/a Wound Vac n/a Skin   Surgical incision; blister right thigh, ecchymosis to numerous area Bowel mgmt: LBM 09/13/17; patient complaining of nausea today; I requested RN on acute to call Lanny Hurst ,PA to request suppository today Bladder mgmt: external catheter Diabetic mgmt n/a   Previous Home Environment Living Arrangements: Other relatives  Lives With: Family Available Help at Discharge: Available PRN/intermittently Type of Home: House Home Layout: One level Home Access: Ramped entrance [place] Shower/Tub: Chiropodist: Handicapped height Bathroom Accessibility: No [place]: No Type of Home Care Services: Biomedical scientist, Home RN Home Care Agency (if known): [place]  Additional Comments: Pt reports using bedside commode  Discharge Living Setting Plans for Discharge Living Setting:  House, Lives with (comment) Type of Home at Discharge: House Discharge Home Layout: Two level, Laundry or work area in basement, Able to live on main level with bedroom/bathroom Alternate Level Stairs-Number of Steps: Flight Discharge Home Access: Ramped entrance Discharge Bathroom Shower/Tub: Tub/shower unit Discharge Bathroom Toilet: Handicapped height Discharge Bathroom Accessibility: No Does the patient have any problems obtaining your medications?: No  Social/Family/Support Systems Patient Roles: Other (Comment) Contact Information: Shalen Petrak - mother - [phone] Anticipated Caregiver: Sister, brother in-law and Mom Anticipated Caregiver's Contact Information: see above Ability/Limitations of Caregiver: Sister works part time for the post office and Mom lives 4 minutes away Caregiver Availability: Evenings only Discharge Plan Discussed with Primary Caregiver: Yes Is Caregiver In Agreement with Plan?: Yes Does Caregiver/Family have Issues with Lodging/Transportation while Pt is in Rehab?: No   Goals/Additional Needs Patient/Family Goal for Rehab: PT mod I, OT mod I to supervision to min assist goals.  Likely will be W/C level goals Expected length of stay: 20-24 days Cultural Considerations: None Dietary Needs: Regular diet, thin liquids Equipment Needs: TBD Pt/Family Agrees to Admission and willing to participate: Yes Program Orientation Provided & Reviewed with Pt/Caregiver Including Roles  & Responsibilities: Yes   Decrease burden of Care through IP rehab admission: n/a  Possible need for SNF placement upon discharge:not anticipated  Patient Condition: Patient's condition has changed since original consult on 09/07/2017. Readmitted to acute 09/11/2017 for surgery. Case reviewed with Rehabilitation physician and patient would benefit form readmit to acute inpatient rehabilitation to complete her rehab program. She will benefit from the coordinated team approach,  Rehabilitation physician and Nursing care along with physical and occupational therapy. We will readmit today.  Preadmission Screen Completed By:  Cleatrice Burke, 09/15/2017 4:37 PM ______________________________________________________________________   Discussed status with Dr. Naaman Plummer on 09/15/2017 at  1437 and received telephone approval for admission today.  Admission Coordinator:  Cleatrice Burke, time 8088 Date 09/15/2017             Cosigned by: [name], MD at 09/15/2017 4:41 PM  Revision History

## 2017-09-15 NOTE — Progress Notes (Signed)
Pt to be transferred to 4W06. Pt and family aware of transfer. Report given to [place], Therapist, sports. All questions answered to satisfaction. Pt to be transferred via bed by staff. Will continue to monitor.

## 2017-09-15 NOTE — Discharge Summary (Signed)
Orthopaedic Trauma Service (OTS)  Patient ID: Sarah [name] MRN: [id number] DOB/AGE: [birth year] 51 y.o.  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 [facility])   Neurogenic bladder   Thoracic myelopathy   Obesity (BMI 30-39.9)    Discharge Diagnoses:  Principal Problem:   Closed fracture of left distal femur ([place]) Active Problems:   Hurthle cell carcinoma of thyroid (HCC)   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)   Neurogenic bladder   Thoracic myelopathy   Obesity (BMI 30-39.9)   Past Medical History:  Diagnosis Date  . Cancer [facility])    FNA of thyroid positive for onconytic/hurthle cell carcinoma  . Chronic back pain   . DDD (degenerative disc disease), cervical   . DJD (degenerative joint disease)   . Family history of adverse reaction to anesthesia    MOTHER HAS NAUSEA   . History of rectal fissure   . HIT (heparin-induced thrombocytopenia) ([place])   . Hypertension   . Obesity   . 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 Pulmonary embolus, right ([place]) 2015  . Rotator cuff tendonitis right     Procedures Performed: 09/11/2017-Dr. 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  Patient presented to [facility] for evaluation.  Patient was found to have recurrent epidural tumor growth and cord compression at T3 and paraplegia.  Patient was taken to the OR during this admission for further decompression.  She was subsequently transferred to inpatient rehab on 09/08/2017.  Patient continued to complain of progressive left knee swelling.  X-rays were performed which demonstrated a displaced left distal third femoral shaft fracture.  Orthopedics was consulted.  Patient was readmitted back to the acute care side of the hospital for orthopedic intervention.  Patient underwent procedure noted above on 09/11/2017.  We discussed possibility of readmission back to inpatient rehab postoperatively but this was not possible due to insurance limitations.  As such patient was transferred to the orthopedic floor for observation pain control and therapies.  Patient's hospital stay was really uncomplicated as we awaited for insurance preapproval for transfer back to inpatient rehab.  Patient progressed well during her hospital stay.  She did have a little issue with a fractured left tooth during her stay when chewing on some hard candy.  She will need outpatient follow-up with her dentist for this as we do not have any dental  coverage in the hospital for at least the next 10 days from what I can see [facility].   Patient was restarted on her Xarelto on postoperative day #1.  No other issues were noted.  Dressing changes performed on postop day #3.  Her incisions look fantastic incisions are healing nicely.  Patient discharged to inpatient rehab on  09/15/2017  Consults: rehabilitation medicine  Significant Diagnostic Studies: labs:   Results for Sarah, [name] (MRN [id number]) as of 09/15/2017 16:47  Ref. Range 09/14/2017 08:48 09/15/2017 06:35  WBC Latest Ref Range: 4.0 - 10.5 K/uL 8.6   RBC Latest Ref Range: 3.87 - 5.11 MIL/uL 2.60 (L)   Hemoglobin Latest Ref Range: 12.0 - 15.0 g/dL 8.0 (L)   HCT Latest Ref Range: 36.0 - 46.0 % 25.5 (L)   MCV Latest Ref Range: 78.0 - 100.0 fL 98.1   MCH Latest Ref Range: 26.0 - 34.0 pg 30.8   MCHC Latest Ref Range: 30.0 - 36.0 g/dL 31.4   RDW Latest Ref Range: 11.5 - 15.5 % 16.8 (H)   Platelets Latest Ref Range: 150 - 400 K/uL 166   TSH Latest Ref Range: 0.350 - 4.500 uIU/mL  0.036 (L)  T4,Free(Direct) Latest Ref Range: 0.82 - 1.77 ng/dL  0.90    Treatments: IV hydration, antibiotics: Ancef, analgesia: acetaminophen, norco and morphine, anticoagulation: xarelto, therapies: PT, OT and RN and surgery: as above   Discharge Exam:      Orthopedic Trauma Service Progress Note    Patient ID: Sarah [name] MRN: [id number] DOB/AGE: 51/17/1967 52 y.o.   Subjective:   Doing ok from R leg standpoint   States she ate some hard candy about 2 days ago and cracked a tooth on her left side. Intermittent pain since then and severe at times    Awaiting CIR approval    Review of Systems  Constitutional: Negative for chills and fever.  Respiratory: Negative for shortness of breath.   Cardiovascular: Negative for chest pain and palpitations.  Gastrointestinal: Negative for abdominal pain, nausea and vomiting.      Objective:    VITALS:         Vitals:    09/14/17 0844 09/14/17 1547 09/15/17 0523 09/15/17 1343  BP: 120/73 (!) 92/54 102/64 115/73  Pulse:   77 75 68  Resp:       14  Temp:   98.5 F (36.9 C) 98.2 F (36.8 C) 98.3 F (36.8 C)  TempSrc:   Oral Oral Oral  SpO2:   99% 100% 100%      Estimated body mass index is 32.38 kg/m as calculated from the following:   Height as of  09/08/17: 5\' 11"  (1.803 m).   Weight as of 09/09/17: 105.3 kg (232 lb 2.3 oz).     Intake/Output      06/24 0701 - 06/25 0700 06/25 0701 - 06/26 0700   P.O. 120 240   I.V.     Total Intake 120 240   Urine 1000    Total Output 1000    Net -880 +240           LABS   Lab Results Last 24 Hours       Results for orders placed or performed during the hospital encounter of 09/11/17 (from the past 24 hour(s))  TSH     Status: Abnormal    Collection Time: 09/15/17  6:35 AM  Result Value Ref Range    TSH 0.036 (L) 0.350 - 4.500 uIU/mL  T4, free     Status: None    Collection Time: 09/15/17  6:35 AM  Result Value Ref Range    Free T4 0.90 0.82 - 1.77 ng/dL          PHYSICAL EXAM:    Gen: awake, alert, sitting up in bed, NAD, appears well HEENT: fracture tooth L upper with missing enamel, no significant inflammation noted, fracture extends to the gum line Lungs: CTA B  Cardiac: RRR Ext:       Left Lower Extremity              Dressing stable               Ext warm              Swelling controlled             No motor or sensory functions noted other than able to feel pressure              + DP pulse             Compartments are soft        Assessment/Plan:      Principal Problem:   Closed fracture of left distal femur (HCC) Active Problems:   Hurthle cell carcinoma of thyroid (HCC)   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 [facility])   Neurogenic bowel   Type 2 diabetes mellitus without complication ([place])   Hypertension   Secondary malignant neoplasm of vertebral column (HCC)   Thoracic spine tumor   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 ([place])   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   POD/HD#: 9   52 year old female s/p fall related to thoracic tumor from metastatic cancer with left distal femur fracture   - fall    -Closed left distal third femur fracture s/p IMN             Nonweightbearing left leg             Unrestricted range of motion left hip and knee             PT and OT                         Aggressive passive range of motion to maintain joint pliability               Dressing changes as needed                         Okay to clean wounds with soap and water only               CIR pending insurance reapproval   - fracture tooth, upper left  Symptomatic management for now             No dental coverage for the next 10 days at least at the hospital according to [facility]             pts primary dentist is in [place]              Will need evaluation by dentist/endodontist for further tx    - Pain management:             Continue with current management   - ABL anemia/Hemodynamics             Stable   - Medical issues              Stable   - DVT/PE prophylaxis:             Xarelto    - ID:              periop abx completed   - Metabolic Bone Disease:             Check vitamin d- pending              Free t4 appropriate    - Activity:             Up with therapy    - FEN/GI prophylaxis/Foley/Lines:             Reg diet     - Dispo:             Continue with therapy             Await insurance approval for CIR             Medically stable for transfer      Discharge medications  Please see inpatient rehabilitation admission record   Disposition:     Follow-up Information    Altamese Holcomb, MD. Schedule an appointment as soon as possible for a visit in 2 week(s).   Specialty:  Orthopedic  Surgery Contact information: [address] [place] [address] [phone]           Discharge Instructions and Plan:  52 year old female s/p fall related to thoracic tumor from metastatic cancer with left distal femur fracture   - fall    -Closed left distal third femur fracture s/p IMN             Nonweightbearing left leg             Unrestricted range of motion left hip and knee             PT and OT                         Aggressive passive range of motion to maintain joint pliability               Dressing changes as needed                         Okay to clean wounds with soap and water only  - fracture tooth, upper left              Symptomatic management for now             No dental coverage for the next 10 days at least at the hospital according to [facility]  pts primary dentist is in [place]              Will need evaluation by dentist/endodontist for further tx    - Pain management:             Continue with current management   - ABL anemia/Hemodynamics             Stable   - Medical issues              Stable   - DVT/PE prophylaxis:             Xarelto    - ID:              periop abx completed   - Metabolic Bone Disease:             Check vitamin d- pending              Free t4 appropriate    - Activity:             Up with therapy    - FEN/GI prophylaxis/Foley/Lines:             Reg diet     - Dispo:            transfer to CIR    Signed:  Jari Pigg, PA-C [facility] [phone] (P) 09/15/2017, 4:36 PM

## 2017-09-15 NOTE — Progress Notes (Signed)
Received report from [facility] on [facility]

## 2017-09-15 NOTE — Progress Notes (Signed)
Pt admitted to [facility]. Pt in pain 10/10. Pt alert an oriented and complaining of LUQ abdominal pain. MD on call notified. Abdominal xray ordered. Will continue to monitor. Awaiting further MD orders.

## 2017-09-15 NOTE — H&P (Signed)
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  Per chart review she lives with sister and brother-in-law.  One level home with ramped entrance.  Up until recently she was using sliding board for transfers and ambulating short distances with a rolling walker up until May 2019.  She had been using a wheelchair most the time for the past few years.  Patient with 2 previous back surgeries T3 in 2015 and 2017 and did receive inpatient rehab services.  Patient with recent admission approximately 2-1/2 weeks ago for nausea and vomiting was discharged to home.  Presented 09/02/2017 with recent fall increasing leg weakness over the past 2 weeks.  Was initially to see her oncologist 08/24/2017 for MRI that was planned however could not make the appointment due to lower extremity weakness.  She did also have some urinary incontinence.  MRI and imaging revealed tumor recurrence at T3 with severe spinal cord compression.  Underwent posterior spinal tumor resection T3 09/03/2017 per Dr. Christella Noa.  Radiation oncology follow-up Dr. Dara Lords await plan for radiation therapy.  Decadron protocol as indicated.  Patient had been on Arixtra for history of DVT and pulmonary emboli.  Plan was to resume Xarelto.  Acute on chronic anemia 10.2-10.8 and monitored.  E. coli UTI initially on Rocephin and completed course of antibiotics.  Physical and occupational therapy evaluations completed patient was admitted for a comprehensive rehab program 09/08/2017.  Follow-up labs showed hemoglobin of 6.2 confirmed at 6.5 she was transfused.  Initial plan was to resume Xarelto for history of pulmonary emboli but held due to anemia.  CT of lumbar and thoracic spine as well as pelvis unremarkable.  Hemoglobin  rebounded 8.7.  Patient with complaints of left knee pain and some effusion.  X-rays and imaging revealed left femoral shaft fracture likely felt to be subacute with noted history of recent fall.  Orthopedic services consulted patient was discharged to acute care services and underwent intramedullary retrograde femoral nailing 09/11/2017 per Dr. Marcelino Scot.  Nonweightbearing left lower extremity.  Patient Xarelto for history of pulmonary emboli DVT has been resumed.  Acute blood loss anemia 8.0 and monitored.  Patient with a E. coli UTI and completing course of Bactrim.  Physical and occupational therapy evaluations resumed 09/12/2017.  Recommendations ongoing for inpatient rehab services.  Patient was admitted for a comprehensive rehab program.  Review of Systems  Constitutional: Negative for chills and fever.  HENT: Negative for hearing loss.   Eyes: Negative for blurred vision and double vision.  Respiratory: Negative for cough and shortness of breath.   Cardiovascular: Positive for leg swelling. Negative for chest pain and palpitations.  Gastrointestinal: Positive for constipation and nausea.  Genitourinary:       Urinary incontinence  Musculoskeletal: Positive for joint pain and myalgias.  Skin: Negative for rash.  Neurological: Positive for sensory change and focal weakness.  Psychiatric/Behavioral: Positive for depression.  All other systems reviewed and are negative.      Past Medical History:  Diagnosis Date  . Cancer [facility])    FNA of thyroid positive for onconytic/hurthle cell carcinoma  . Chronic back pain   . DDD (degenerative disc disease), cervical   . DJD (degenerative joint disease)   . History of rectal fissure   . HIT (heparin-induced thrombocytopenia) ([place])   .  Hypertension   . Obesity   . Paraplegia at T4 level ([place])   . Pulmonary embolus, right ([place]) 2015  . Rotator cuff tendonitis right        Past Surgical History:  Procedure Laterality Date  .  LAMINECTOMY N/A 12/14/2013   Procedure: THORACIC LAMINECTOMY FOR TUMOR THORACIC THREE;  Surgeon: Ashok Pall, MD;  Location: [place] NEURO ORS;  Service: Neurosurgery;  Laterality: N/A;  THORACIC LAMINECTOMY FOR TUMOR THORACIC THREE  . LAMINECTOMY N/A 07/05/2015   Procedure: THORACIC LAMINECTOMY FOR TUMOR;  Surgeon: Ashok Pall, MD;  Location: [place] NEURO ORS;  Service: Neurosurgery;  Laterality: N/A;  . LAMINECTOMY N/A 09/03/2017   Procedure: POSTERIOR SPINAL TUMOR RESECTION THORACIC THREE;  Surgeon: Ashok Pall, MD;  Location: [place];  Service: Neurosurgery;  Laterality: N/A;  POSTERIOR SPINAL TUMOR RESECTION THORACIC THREE  . POSTERIOR LUMBAR FUSION 4 LEVEL N/A 12/30/2013   Procedure: Thoracic one-Thoracic five posterior thoracic fusion with pedicle screws;  Surgeon: Ashok Pall, MD;  Location: [place] NEURO ORS;  Service: Neurosurgery;  Laterality: N/A;  Thoracic one-Thoracic five posterior thoracic fusion with pedicle screws  . THYROIDECTOMY N/A 01/09/2014   Procedure: TOTAL THYROIDECTOMY;  Surgeon: Izora Gala, MD;  Location: [place];  Service: ENT;  Laterality: N/A;  . TONSILLECTOMY          Family History  Problem Relation Age of Onset  . Hypertension Mother   . Diabetes Mother   . Hypertension Father   . Stroke Father   . Diabetes Father   . Diabetes Sister   . Diabetes Sister    Social History:  reports that she has never smoked. She has never used smokeless tobacco. She reports that she does not drink alcohol or use drugs. Allergies:       Allergies  Allergen Reactions  . Bee Venom Anaphylaxis  . Keflex [Cephalexin] Nausea And Vomiting  . Tramadol Nausea And Vomiting  . Gabapentin Nausea And Vomiting  . Heparin Other (See Comments)    Weak positive platelets induced antibodies, SRA negative--2015  . Hydrochlorothiazide Nausea And Vomiting  . Hydrocodone Nausea And Vomiting  . Oxycodone-Acetaminophen Nausea And Vomiting         Medications Prior to Admission    Medication Sig Dispense Refill  . acetaminophen (TYLENOL) 325 MG tablet Take 2 tablets (650 mg total) by mouth every 4 (four) hours as needed for mild pain ((score 1 to 3) or temp > 100.5).    . baclofen (LIORESAL) 20 MG tablet TAKE (1) TABLET THREE TIMES DAILY. 90 tablet 1  . bisacodyl (DULCOLAX) 10 MG suppository Place 1 suppository (10 mg total) rectally at bedtime. 12 suppository 0  . dexamethasone (DECADRON) 4 MG tablet Take 1 tablet (4 mg total) by mouth every 6 (six) hours.    . diazepam (VALIUM) 5 MG tablet Take 1 tablet (5 mg total) by mouth every 6 (six) hours as needed for muscle spasms. 30 tablet 0  . fluticasone (FLONASE) 50 MCG/ACT nasal spray Place 2 sprays into both nostrils daily. 16 g 6  . furosemide (LASIX) 20 MG tablet Take 1 tablet (20 mg total) by mouth daily as needed. (Patient taking differently: Take 20 mg by mouth daily as needed for fluid. ) 30 tablet 1  . HYDROcodone-acetaminophen (NORCO/VICODIN) 5-325 MG tablet Take 1-2 tablets by mouth every 4 (four) hours as needed for moderate pain. 30 tablet 0  . levothyroxine (SYNTHROID, LEVOTHROID) 137 MCG tablet Take 137 mcg by mouth daily before breakfast.    .  lisinopril (PRINIVIL,ZESTRIL) 20 MG tablet Take 2 tablets (40 mg total) by mouth daily. 60 tablet 1  . ondansetron (ZOFRAN) 4 MG tablet Take 1 tablet (4 mg total) by mouth every 6 (six) hours. 30 tablet 5  . pantoprazole (PROTONIX) 40 MG tablet Take 1 tablet (40 mg total) by mouth daily. 30 tablet 2  . senna (SENOKOT) 8.6 MG TABS tablet Take 2 tablets (17.2 mg total) by mouth daily. 120 each 0  . sertraline (ZOLOFT) 25 MG tablet Take 1 tablet (25 mg total) by mouth daily. 30 tablet 3  . sulfamethoxazole-trimethoprim (BACTRIM DS,SEPTRA DS) 800-160 MG tablet Take 1 tablet by mouth every 12 (twelve) hours.    . traZODone (DESYREL) 50 MG tablet Take 1 tablet (50 mg total) by mouth at bedtime as needed for sleep.      Drug Regimen Review Drug regimen was  reviewed and remains appropriate with no significant issues identified  Home: Home Living Family/patient expects to be discharged to:: Inpatient rehab   Functional History: Prior Function Level of Independence: Needs assistance Gait / Transfers Assistance Needed: Prior to tumor resection, was performing slide board transfers and short distance ambulation. At rehab, was utilizing [place] for sit<>stand.  ADL's / Homemaking Assistance Needed: Assistance for LB ADL. Able to complete UB ADL without assistance at bed level.   Functional Status:  Mobility: Bed Mobility Overal bed mobility: Needs Assistance Bed Mobility: Rolling Rolling: Mod assist, Max assist Sidelying to sit: Max assist, +2 for physical assistance Sit to sidelying: Max assist, +2 for physical assistance General bed mobility comments: Facilitated improved core strength to initiate rolling. Pt requiring mod assist to the R and max assist to the L.   ADL: ADL Overall ADL's : Needs assistance/impaired Eating/Feeding: Set up, Bed level Grooming: Set up, Bed level Upper Body Bathing: Set up, Bed level Lower Body Bathing: Maximal assistance, Bed level Upper Body Dressing : Set up, Bed level Lower Body Dressing: Total assistance, Bed level Toilet Transfer: Total assistance Toileting- Clothing Manipulation and Hygiene: Total assistance, Bed level Functional mobility during ADLs: Total assistance General ADL Comments: Pt hesitant to participate in mobility past bed mobility. She was agreeable to core strengthening activities and rolling for improved ADL participation.   Cognition: Cognition Overall Cognitive Status: Within Functional Limits for tasks assessed Orientation Level: Oriented X4 Cognition Arousal/Alertness: Awake/alert Behavior During Therapy: WFL for tasks assessed/performed Overall Cognitive Status: Within Functional Limits for tasks assessed  Physical Exam: Blood pressure 117/76, pulse 69,  temperature 98.2 F (36.8 C), temperature source Oral, resp. rate 15, last menstrual period 10/22/2016, SpO2 100 %. Physical Exam  Constitutional: She is oriented to person, place, and time. She appears well-developed. No distress.  HENT:  Head: Normocephalic.  Eyes: Pupils are equal, round, and reactive to light.  Neck: Normal range of motion.  Cardiovascular: Normal rate. Exam reveals no friction rub.  No murmur heard. Respiratory: Effort normal. No respiratory distress. She has no wheezes.  GI: Soft. Bowel sounds are normal. She exhibits no distension. There is no tenderness.  Neurological: She is alert and oriented to person, place, and time. No cranial nerve deficit.  Decreased sensation below level of injury. Minimal pain sensation in LE's---doesn't feel pain from left leg wound. LE: motor 0/5 prox to distal. DTR's 3+ LE's. Clonus bilateral ankles, 2/4 resting tone.   Skin:  Left leg incision clean and intact  Psychiatric: She has a normal mood and affect. Her behavior is normal.    LabResultsLast48Hours  Results  for orders placed or performed during the hospital encounter of 09/11/17 (from the past 48 hour(s))  Basic metabolic panel     Status: Abnormal   Collection Time: 09/13/17  4:22 AM  Result Value Ref Range   Sodium 137 135 - 145 mmol/L   Potassium 4.7 3.5 - 5.1 mmol/L   Chloride 105 101 - 111 mmol/L   CO2 27 22 - 32 mmol/L   Glucose, Bld 178 (H) 65 - 99 mg/dL   BUN 24 (H) 6 - 20 mg/dL   Creatinine, Ser 0.98 0.44 - 1.00 mg/dL   Calcium 7.0 (L) 8.9 - 10.3 mg/dL   GFR calc non Af Amer >60 >60 mL/min   GFR calc Af Amer >60 >60 mL/min    Comment: (NOTE) The eGFR has been calculated using the CKD EPI equation. This calculation has not been validated in all clinical situations. eGFR's persistently <60 mL/min signify possible Chronic Kidney Disease.    Anion gap 5 5 - 15    Comment: Performed at [facility] [address],  [place], [place] [address]  Basic metabolic panel     Status: Abnormal   Collection Time: 09/14/17  4:07 AM  Result Value Ref Range   Sodium 136 135 - 145 mmol/L   Potassium 4.9 3.5 - 5.1 mmol/L    Comment: SLIGHT HEMOLYSIS   Chloride 106 101 - 111 mmol/L   CO2 27 22 - 32 mmol/L   Glucose, Bld 189 (H) 65 - 99 mg/dL   BUN 27 (H) 6 - 20 mg/dL   Creatinine, Ser 1.02 (H) 0.44 - 1.00 mg/dL   Calcium 7.2 (L) 8.9 - 10.3 mg/dL   GFR calc non Af Amer >60 >60 mL/min   GFR calc Af Amer >60 >60 mL/min    Comment: (NOTE) The eGFR has been calculated using the CKD EPI equation. This calculation has not been validated in all clinical situations. eGFR's persistently <60 mL/min signify possible Chronic Kidney Disease.    Anion gap 3 (L) 5 - 15    Comment: Performed at [facility], [place] [address] [place], [place] [address]     ImagingResults(Last48hours)  No results found.       Medical Problem List and Plan: 1.  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 Course complicated by distal femur fracture requiring ORIF             -admit to inpatient rehab 2.  DVT Prophylaxis/Anticoagulation: Chronic Xarelto for history of pulmonary emboli DVT 3. Pain Management: Baclofen 20 mg 3 times daily, hydrocodone as needed for pain as well as Valium for muscle spasms 4. Mood: Zoloft 25 mg daily 5. Neuropsych: This patient is capable of making decisions on her own behalf. 6. Skin/Wound Care: Routine skin checks 7. Fluids/Electrolytes/Nutrition: Routine in and outs with follow-up chemistries 8.  Left femoral shaft fracture/subacute from recent fall.  Status post intramedullary retrograde femoral nailing 09/11/2017.  Nonweightbearing 9.  Acute on chronic anemia.  Follow-up CBC 10.  Hypertension.  Lisinopril 40 mg daily.  Monitor with increased mobility 11.  Hypothyroidism.   Synthroid 12.  Neurogenic bowel and bladder.  Check PVRs.  Complete course of Bactrim for E. coli UTI.             -bowel program                        -  I/O cath schedule             -needs bowel movement this evening----sorbitol  Post Admission Physician Evaluation: 1. Functional deficits secondary  to thoracic SCI, left femur fx. 2. [name] admitted to receive collaborative, interdisciplinary care between the physiatrist, rehab nursing staff, and therapy team. 3. Patient's level of medical complexity and substantial therapy needs in context of that medical necessity cannot be provided at a lesser intensity of care such as a SNF. 4. Patient has experienced substantial functional loss from his/her baseline which was documented above under the "Functional History" and "Functional Status" headings.  Judging by the patient's diagnosis, physical exam, and functional history, the patient has potential for functional progress which will result in measurable gains while on inpatient rehab.  These gains will be of substantial and practical use upon discharge  in facilitating mobility and self-care at the household level. 5. Physiatrist will provide 24 hour management of medical needs as well as oversight of the therapy plan/treatment and provide guidance as appropriate regarding the interaction of the two. 6. The Preadmission Screening has been reviewed and patient status is unchanged unless otherwise stated above. 7. 24 hour rehab nursing will assist with bladder management, bowel management, safety, skin/wound care, disease management, medication administration, pain management and patient education  and help integrate therapy concepts, techniques,education, etc. 8. PT will assess and treat for/with: Lower extremity strength, range of motion, stamina, balance, functional mobility, safety, adaptive techniques and equipment, NMR, family ed.   Goals are: mod I to supervision at w/c level. 9. OT will  assess and treat for/with: ADL's, functional mobility, safety, upper extremity strength, adaptive techniques and equipment, NMR, family ed.   Goals are: mod I to min assist goals. Therapy may proceed with showering this patient. 10. SLP will assess and treat for/with: n/a.  Goals are: n/a. 11. Case Management and Social Worker will assess and treat for psychological issues and discharge planning. 12. Team conference will be held weekly to assess progress toward goals and to determine barriers to discharge. 13. Patient will receive at least 3 hours of therapy per day at least 5 days per week. 14. ELOS: 20-27 days       15. Prognosis:  excellent    I have personally performed a face to face diagnostic evaluation of this patient and formulated the key components of the plan.  Additionally, I have personally reviewed laboratory data, imaging studies, as well as relevant notes and concur with the physician assistant's documentation above.  [name], MD, FAAPMR   Lavon Paganini [place], PA-C 09/14/2017

## 2017-09-15 NOTE — Progress Notes (Signed)
PT Cancellation Note  Patient Details Name: Sarah [name] MRN: [id number] DOB: November 03, 1965   Cancelled Treatment:    Reason Eval/Treat Not Completed: (P) Medical issues which prohibited therapy(Pt actively vomitting at this time, will defer Tx, RN aware.  )   Cristela Blue 09/15/2017, 4:27 PM  Governor Rooks, PTA pager [phone]

## 2017-09-16 ENCOUNTER — Inpatient Hospital Stay (HOSPITAL_COMMUNITY): Payer: Medicare Other | Admitting: Occupational Therapy

## 2017-09-16 ENCOUNTER — Encounter (HOSPITAL_COMMUNITY): Payer: Self-pay | Admitting: Internal Medicine

## 2017-09-16 ENCOUNTER — Inpatient Hospital Stay (HOSPITAL_COMMUNITY): Payer: Medicare Other

## 2017-09-16 ENCOUNTER — Inpatient Hospital Stay (HOSPITAL_COMMUNITY): Payer: Medicare Other | Admitting: Physical Therapy

## 2017-09-16 ENCOUNTER — Inpatient Hospital Stay (HOSPITAL_COMMUNITY)
Admission: AD | Admit: 2017-09-16 | Discharge: 2017-09-21 | DRG: 392 | Disposition: A | Discharge status: Rehabilitation Facility | Payer: Medicare Other | Source: Ambulatory Visit | Attending: Family Medicine | Admitting: Family Medicine

## 2017-09-16 DIAGNOSIS — G43A1 Cyclical vomiting, intractable: Secondary | ICD-10-CM | POA: Diagnosis not present

## 2017-09-16 DIAGNOSIS — R112 Nausea with vomiting, unspecified: Secondary | ICD-10-CM

## 2017-09-16 DIAGNOSIS — D61818 Other pancytopenia: Secondary | ICD-10-CM | POA: Diagnosis not present

## 2017-09-16 DIAGNOSIS — B379 Candidiasis, unspecified: Secondary | ICD-10-CM | POA: Diagnosis not present

## 2017-09-16 DIAGNOSIS — N319 Neuromuscular dysfunction of bladder, unspecified: Secondary | ICD-10-CM | POA: Diagnosis not present

## 2017-09-16 DIAGNOSIS — C73 Malignant neoplasm of thyroid gland: Secondary | ICD-10-CM | POA: Diagnosis present

## 2017-09-16 DIAGNOSIS — F329 Major depressive disorder, single episode, unspecified: Secondary | ICD-10-CM | POA: Diagnosis present

## 2017-09-16 DIAGNOSIS — I10 Essential (primary) hypertension: Secondary | ICD-10-CM | POA: Diagnosis not present

## 2017-09-16 DIAGNOSIS — S72302A Unspecified fracture of shaft of left femur, initial encounter for closed fracture: Secondary | ICD-10-CM

## 2017-09-16 DIAGNOSIS — G8220 Paraplegia, unspecified: Secondary | ICD-10-CM | POA: Diagnosis not present

## 2017-09-16 DIAGNOSIS — Z833 Family history of diabetes mellitus: Secondary | ICD-10-CM | POA: Diagnosis not present

## 2017-09-16 DIAGNOSIS — K592 Neurogenic bowel, not elsewhere classified: Secondary | ICD-10-CM | POA: Diagnosis present

## 2017-09-16 DIAGNOSIS — R739 Hyperglycemia, unspecified: Secondary | ICD-10-CM | POA: Diagnosis present

## 2017-09-16 DIAGNOSIS — S72302S Unspecified fracture of shaft of left femur, sequela: Secondary | ICD-10-CM | POA: Diagnosis not present

## 2017-09-16 DIAGNOSIS — Z7901 Long term (current) use of anticoagulants: Secondary | ICD-10-CM | POA: Diagnosis not present

## 2017-09-16 DIAGNOSIS — G992 Myelopathy in diseases classified elsewhere: Secondary | ICD-10-CM | POA: Diagnosis not present

## 2017-09-16 DIAGNOSIS — K5900 Constipation, unspecified: Secondary | ICD-10-CM | POA: Diagnosis not present

## 2017-09-16 DIAGNOSIS — N390 Urinary tract infection, site not specified: Secondary | ICD-10-CM | POA: Diagnosis not present

## 2017-09-16 DIAGNOSIS — G9589 Other specified diseases of spinal cord: Secondary | ICD-10-CM | POA: Diagnosis not present

## 2017-09-16 DIAGNOSIS — D649 Anemia, unspecified: Secondary | ICD-10-CM | POA: Diagnosis present

## 2017-09-16 DIAGNOSIS — T380X5A Adverse effect of glucocorticoids and synthetic analogues, initial encounter: Secondary | ICD-10-CM | POA: Diagnosis not present

## 2017-09-16 DIAGNOSIS — E875 Hyperkalemia: Secondary | ICD-10-CM | POA: Diagnosis present

## 2017-09-16 DIAGNOSIS — F502 Bulimia nervosa: Secondary | ICD-10-CM | POA: Diagnosis not present

## 2017-09-16 DIAGNOSIS — M62838 Other muscle spasm: Secondary | ICD-10-CM

## 2017-09-16 DIAGNOSIS — D62 Acute posthemorrhagic anemia: Secondary | ICD-10-CM | POA: Diagnosis not present

## 2017-09-16 DIAGNOSIS — R1310 Dysphagia, unspecified: Secondary | ICD-10-CM | POA: Diagnosis not present

## 2017-09-16 DIAGNOSIS — E039 Hypothyroidism, unspecified: Secondary | ICD-10-CM | POA: Diagnosis not present

## 2017-09-16 DIAGNOSIS — Z86711 Personal history of pulmonary embolism: Secondary | ICD-10-CM

## 2017-09-16 DIAGNOSIS — G9529 Other cord compression: Secondary | ICD-10-CM

## 2017-09-16 DIAGNOSIS — S72402A Unspecified fracture of lower end of left femur, initial encounter for closed fracture: Secondary | ICD-10-CM | POA: Diagnosis present

## 2017-09-16 DIAGNOSIS — E119 Type 2 diabetes mellitus without complications: Secondary | ICD-10-CM

## 2017-09-16 DIAGNOSIS — Z85850 Personal history of malignant neoplasm of thyroid: Secondary | ICD-10-CM | POA: Diagnosis not present

## 2017-09-16 DIAGNOSIS — K8020 Calculus of gallbladder without cholecystitis without obstruction: Secondary | ICD-10-CM | POA: Diagnosis not present

## 2017-09-16 DIAGNOSIS — C7951 Secondary malignant neoplasm of bone: Secondary | ICD-10-CM | POA: Diagnosis present

## 2017-09-16 DIAGNOSIS — B9620 Unspecified Escherichia coli [E. coli] as the cause of diseases classified elsewhere: Secondary | ICD-10-CM | POA: Diagnosis not present

## 2017-09-16 DIAGNOSIS — Z79899 Other long term (current) drug therapy: Secondary | ICD-10-CM | POA: Diagnosis not present

## 2017-09-16 DIAGNOSIS — E890 Postprocedural hypothyroidism: Secondary | ICD-10-CM | POA: Diagnosis present

## 2017-09-16 DIAGNOSIS — Z79890 Hormone replacement therapy (postmenopausal): Secondary | ICD-10-CM

## 2017-09-16 DIAGNOSIS — B370 Candidal stomatitis: Secondary | ICD-10-CM | POA: Diagnosis not present

## 2017-09-16 LAB — BASIC METABOLIC PANEL
ANION GAP: 8 (ref 5–15)
Anion gap: 10 (ref 5–15)
BUN: 34 mg/dL — AB (ref 6–20)
BUN: 31 mg/dL — AB (ref 6–20)
CALCIUM: 7.7 mg/dL — AB (ref 8.9–10.3)
CHLORIDE: 102 mmol/L (ref 98–111)
CO2: 23 mmol/L (ref 22–32)
CO2: 25 mmol/L (ref 22–32)
CREATININE: 1.08 mg/dL — AB (ref 0.44–1.00)
Calcium: 7.7 mg/dL — ABNORMAL LOW (ref 8.9–10.3)
Chloride: 102 mmol/L (ref 98–111)
Creatinine, Ser: 0.96 mg/dL (ref 0.44–1.00)
GFR calc Af Amer: >60 mL/min (ref >60)
GFR calc non Af Amer: 58 mL/min — ABNORMAL LOW (ref >60)
GFR calc non Af Amer: >60 mL/min (ref >60)
GLUCOSE: 159 mg/dL — AB (ref 70–99)
GLUCOSE: 145 mg/dL — AB (ref 70–99)
POTASSIUM: 4.9 mmol/L (ref 3.5–5.1)
Potassium: 5.1 mmol/L (ref 3.5–5.1)
SODIUM: 135 mmol/L (ref 135–145)
Sodium: 135 mmol/L (ref 135–145)

## 2017-09-16 LAB — APTT
APTT: 166 s — AB (ref 24–36)
aPTT: 136 seconds — ABNORMAL HIGH (ref 24–36)

## 2017-09-16 LAB — HEPARIN LEVEL (UNFRACTIONATED): HEPARIN UNFRACTIONATED: 1.34 [IU]/mL — AB (ref 0.30–0.70)

## 2017-09-16 LAB — HEPATIC FUNCTION PANEL
ALBUMIN: 2.5 g/dL — AB (ref 3.5–5.0)
ALT: 17 U/L (ref 0–44)
AST: 20 U/L (ref 15–41)
Alkaline Phosphatase: 151 U/L — ABNORMAL HIGH (ref 38–126)
BILIRUBIN INDIRECT: 1.1 mg/dL — AB (ref 0.3–0.9)
BILIRUBIN TOTAL: 1.4 mg/dL — AB (ref 0.3–1.2)
Bilirubin, Direct: 0.3 mg/dL — ABNORMAL HIGH (ref 0.0–0.2)
Total Protein: 4.7 g/dL — ABNORMAL LOW (ref 6.5–8.1)

## 2017-09-16 LAB — GLUCOSE, CAPILLARY
GLUCOSE-CAPILLARY: 199 mg/dL — AB (ref 70–99)
GLUCOSE-CAPILLARY: 141 mg/dL — AB (ref 70–99)
GLUCOSE-CAPILLARY: 142 mg/dL — AB (ref 70–99)
GLUCOSE-CAPILLARY: 135 mg/dL — AB (ref 70–99)
Glucose-Capillary: 128 mg/dL — ABNORMAL HIGH (ref 70–99)
Glucose-Capillary: 122 mg/dL — ABNORMAL HIGH (ref 70–99)

## 2017-09-16 LAB — CBC
HCT: 25.7 % — ABNORMAL LOW (ref 36.0–46.0)
Hemoglobin: 8.1 g/dL — ABNORMAL LOW (ref 12.0–15.0)
MCH: 30.3 pg (ref 26.0–34.0)
MCHC: 31.5 g/dL (ref 30.0–36.0)
MCV: 96.3 fL (ref 78.0–100.0)
PLATELETS: 212 10*3/uL (ref 150–400)
RBC: 2.67 MIL/uL — AB (ref 3.87–5.11)
RDW: 17.3 % — ABNORMAL HIGH (ref 11.5–15.5)
WBC: 10.5 10*3/uL (ref 4.0–10.5)

## 2017-09-16 LAB — MAGNESIUM: MAGNESIUM: 2.2 mg/dL (ref 1.7–2.4)

## 2017-09-16 LAB — CALCIUM, IONIZED: CALCIUM, IONIZED, SERUM: 4.3 mg/dL — AB (ref 4.5–5.6)

## 2017-09-16 LAB — CALCITRIOL (1,25 DI-OH VIT D): Vit D, 1,25-Dihydroxy: 15.1 pg/mL — ABNORMAL LOW (ref 19.9–79.3)

## 2017-09-16 LAB — LIPASE, BLOOD: Lipase: 25 U/L (ref 11–51)

## 2017-09-16 LAB — PTH, INTACT AND CALCIUM
Calcium, Total (PTH): 7.2 mg/dL — ABNORMAL LOW (ref 8.7–10.2)
PTH: 62 pg/mL (ref 15–65)

## 2017-09-16 LAB — TROPONIN I

## 2017-09-16 LAB — VITAMIN D 25 HYDROXY (VIT D DEFICIENCY, FRACTURES): VIT D 25 HYDROXY: 9.9 ng/mL — AB (ref 30.0–100.0)

## 2017-09-16 MED ORDER — INSULIN ASPART 100 UNIT/ML ~~LOC~~ SOLN
0.0000 [IU] | SUBCUTANEOUS | Status: DC
  Administered 2017-09-16 (×2): 1 [IU] via SUBCUTANEOUS
  Administered 2017-09-16: 2 [IU] via SUBCUTANEOUS
  Administered 2017-09-16 – 2017-09-17 (×4): 1 [IU] via SUBCUTANEOUS
  Administered 2017-09-18 (×2): 2 [IU] via SUBCUTANEOUS
  Administered 2017-09-18 – 2017-09-19 (×4): 1 [IU] via SUBCUTANEOUS

## 2017-09-16 MED ORDER — ONDANSETRON HCL 4 MG PO TABS: 4.0000 mg | ORAL_TABLET | Freq: Four times a day (QID) | ORAL | Status: DC | PRN

## 2017-09-16 MED ORDER — DOCUSATE SODIUM 50 MG/5ML PO LIQD
100.0000 mg | Freq: Two times a day (BID) | ORAL | Status: DC
  Administered 2017-09-16 (×2): 100 mg via ORAL
  Filled 2017-09-16 (×2): qty 10

## 2017-09-16 MED ORDER — BISACODYL 10 MG RE SUPP
10.0000 mg | Freq: Once | RECTAL | Status: AC
  Administered 2017-09-16: 10 mg via RECTAL
  Filled 2017-09-16: qty 1

## 2017-09-16 MED ORDER — DEXAMETHASONE SODIUM PHOSPHATE 10 MG/ML IJ SOLN
4.0000 mg | Freq: Four times a day (QID) | INTRAMUSCULAR | Status: DC
  Administered 2017-09-16 – 2017-09-19 (×15): 4 mg via INTRAVENOUS
  Filled 2017-09-16 (×15): qty 1

## 2017-09-16 MED ORDER — ACETAMINOPHEN 325 MG PO TABS
650.0000 mg | ORAL_TABLET | Freq: Four times a day (QID) | ORAL | Status: DC | PRN
  Administered 2017-09-18 – 2017-09-21 (×3): 650 mg via ORAL
  Filled 2017-09-16 (×4): qty 2

## 2017-09-16 MED ORDER — FAMOTIDINE IN NACL 20-0.9 MG/50ML-% IV SOLN
20.0000 mg | Freq: Two times a day (BID) | INTRAVENOUS | Status: DC
  Administered 2017-09-16 – 2017-09-17 (×4): 20 mg via INTRAVENOUS
  Filled 2017-09-16 (×4): qty 50

## 2017-09-16 MED ORDER — MAGNESIUM SULFATE IN D5W 1-5 GM/100ML-% IV SOLN
1.0000 g | Freq: Once | INTRAVENOUS | Status: AC
  Administered 2017-09-16: 1 g via INTRAVENOUS
  Filled 2017-09-16: qty 100

## 2017-09-16 MED ORDER — HYDRALAZINE HCL 20 MG/ML IJ SOLN: 10.0000 mg | INTRAMUSCULAR | Status: DC | PRN

## 2017-09-16 MED ORDER — LEVOTHYROXINE SODIUM 100 MCG IV SOLR
68.5000 ug | Freq: Every day | INTRAVENOUS | Status: DC
  Administered 2017-09-16: 68.5 ug via INTRAVENOUS
  Filled 2017-09-16: qty 5

## 2017-09-16 MED ORDER — SODIUM CHLORIDE 0.9 % IV SOLN
1.0000 g | Freq: Once | INTRAVENOUS | Status: AC
  Administered 2017-09-16: 1 g via INTRAVENOUS
  Filled 2017-09-16: qty 10

## 2017-09-16 MED ORDER — PROMETHAZINE HCL 25 MG/ML IJ SOLN
12.5000 mg | Freq: Four times a day (QID) | INTRAMUSCULAR | Status: DC | PRN
  Administered 2017-09-16 – 2017-09-21 (×4): 12.5 mg via INTRAVENOUS
  Filled 2017-09-16 (×4): qty 1

## 2017-09-16 MED ORDER — HEPARIN (PORCINE) IN NACL 100-0.45 UNIT/ML-% IJ SOLN
900.0000 [IU]/h | INTRAMUSCULAR | Status: DC
  Administered 2017-09-16: 900 [IU]/h via INTRAVENOUS

## 2017-09-16 MED ORDER — POLYETHYLENE GLYCOL 3350 17 G PO PACK
17.0000 g | PACK | Freq: Two times a day (BID) | ORAL | Status: DC
  Administered 2017-09-16 – 2017-09-20 (×8): 17 g via ORAL
  Filled 2017-09-16 (×10): qty 1

## 2017-09-16 MED ORDER — TRAMADOL HCL 50 MG PO TABS: 50.0000 mg | ORAL_TABLET | Freq: Four times a day (QID) | ORAL | Status: DC | PRN

## 2017-09-16 MED ORDER — SODIUM CHLORIDE 0.9 % IV SOLN
INTRAVENOUS | Status: AC
  Administered 2017-09-16 (×2): via INTRAVENOUS

## 2017-09-16 MED ORDER — ONDANSETRON HCL 4 MG/2ML IJ SOLN
4.0000 mg | Freq: Four times a day (QID) | INTRAMUSCULAR | Status: DC | PRN
  Administered 2017-09-16 – 2017-09-21 (×3): 4 mg via INTRAVENOUS
  Filled 2017-09-16 (×3): qty 2

## 2017-09-16 NOTE — Progress Notes (Signed)
ANTICOAGULATION CONSULT NOTE - Initial Consult  Pharmacy Consult for Heparin (Xarelto on hold) Indication: History of VTE  Allergies  Allergen Reactions  . Bee Venom Anaphylaxis  . Keflex [Cephalexin] Nausea And Vomiting  . Tramadol Nausea And Vomiting  . Gabapentin Nausea And Vomiting  . Heparin Other (See Comments)    Weak positive platelets induced antibodies, SRA negative--2015  . Hydrochlorothiazide Nausea And Vomiting  . Hydrocodone Nausea And Vomiting  . Oxycodone-Acetaminophen Nausea And Vomiting    Patient Measurements: Heparin dosing weight: 94 kg  Vital Signs: Temp: 97.7 F (36.5 C) (06/26 0027) Temp Source: Oral (06/26 0027) BP: 140/77 (06/26 0027) Pulse Rate: 73 (06/26 0027)  Labs: Recent Labs    09/13/17 0422 09/14/17 0407 09/14/17 0848 09/15/17 2328  HGB  --   --  8.0* 8.4*  HCT  --   --  25.5* 26.2*  PLT  --   --  166 176  APTT  --   --   --  24  LABPROT  --   --   --  14.8  INR  --   --   --  1.17  CREATININE 0.98 1.02*  --  1.08*  TROPONINI  --   --   --  <0.03    Estimated Creatinine Clearance: 82.5 mL/min (A) (by C-G formula based on SCr of 1.08 mg/dL (H)).   Medical History: Past Medical History:  Diagnosis Date  . Cancer [facility])    FNA of thyroid positive for onconytic/hurthle cell carcinoma  . Chronic back pain   . DDD (degenerative disc disease), cervical   . DJD (degenerative joint disease)   . Family history of adverse reaction to anesthesia    MOTHER HAS NAUSEA   . History of rectal fissure   . HIT (heparin-induced thrombocytopenia) ([place])   . Hypertension   . Obesity   . Paraplegia at T4 level ([place])   . Pulmonary embolus, right ([place]) 2015  . Rotator cuff tendonitis right    Medications:  Medications Prior to Admission  Medication Sig Dispense Refill Last Dose  . acetaminophen (TYLENOL) 325 MG tablet Take 2 tablets (650 mg total) by mouth every 4 (four) hours as needed for mild pain ((score 1 to 3) or temp > 100.5).     .  baclofen (LIORESAL) 20 MG tablet TAKE (1) TABLET THREE TIMES DAILY. 90 tablet 1 09/01/2017 at Unknown time  . bisacodyl (DULCOLAX) 10 MG suppository Place 1 suppository (10 mg total) rectally at bedtime. 12 suppository 0   . dexamethasone (DECADRON) 4 MG tablet Take 1 tablet (4 mg total) by mouth every 6 (six) hours.     . diazepam (VALIUM) 5 MG tablet Take 1 tablet (5 mg total) by mouth every 6 (six) hours as needed for muscle spasms. 30 tablet 0   . fluticasone (FLONASE) 50 MCG/ACT nasal spray Place 2 sprays into both nostrils daily. 16 g 6 09/01/2017 at Unknown time  . furosemide (LASIX) 20 MG tablet Take 1 tablet (20 mg total) by mouth daily as needed. (Patient taking differently: Take 20 mg by mouth daily as needed for fluid. ) 30 tablet 1 09/01/2017 at Unknown time  . HYDROcodone-acetaminophen (NORCO/VICODIN) 5-325 MG tablet Take 1-2 tablets by mouth every 4 (four) hours as needed for moderate pain. 30 tablet 0   . levothyroxine (SYNTHROID, LEVOTHROID) 137 MCG tablet Take 137 mcg by mouth daily before breakfast.   09/01/2017 at Unknown time  . lisinopril (PRINIVIL,ZESTRIL) 20 MG tablet Take 2 tablets (  40 mg total) by mouth daily. 60 tablet 1 09/01/2017 at Unknown time  . ondansetron (ZOFRAN) 4 MG tablet Take 1 tablet (4 mg total) by mouth every 6 (six) hours. 30 tablet 5 09/01/2017 at Unknown time  . pantoprazole (PROTONIX) 40 MG tablet Take 1 tablet (40 mg total) by mouth daily. 30 tablet 2 09/01/2017 at Unknown time  . senna (SENOKOT) 8.6 MG TABS tablet Take 2 tablets (17.2 mg total) by mouth daily. 120 each 0 09/01/2017 at Unknown time  . sertraline (ZOLOFT) 25 MG tablet Take 1 tablet (25 mg total) by mouth daily. 30 tablet 3 09/01/2017 at Unknown time  . sulfamethoxazole-trimethoprim (BACTRIM DS,SEPTRA DS) 800-160 MG tablet Take 1 tablet by mouth every 12 (twelve) hours.     . traZODone (DESYREL) 50 MG tablet Take 1 tablet (50 mg total) by mouth at bedtime as needed for sleep.       Assessment: 52  y/o female with stage IV hurthle cell carcinoma of thyroid and paraplegia at T4 level on Xarelto PTA for hx VTE. She underwent repair of a left distal femure fracture on 6/21 and was discharged to inpatient rehab. She has been vomiting since arrival and medications are being changed to IV. Pharmacy consulted to transition to argatroban. Of note, patient does NOT have a HIT allergy (SRA negative in 2015). Spoke with patient about results of HIT studies from 2015 and she stated she would be ok to use heparin. Spoke with Dr. Hal Hope who was ok to change to heparin. Last Xarelto dose was 6/24 at 16:00. SCr is normal, Hgb low stable in 7-8 range, platelets are normal.  Will monitor aPTT until heparin level and aPTT correlate since Xarelto affects the heparin level.  6/26 AM update: pt transferred from IP rehab, heparin to start at 1400 units/hr, will re-time labs  Goal of Therapy:  Heparin level 0.3-0.7 units/ml aPTT 66-102 seconds Monitor platelets by anticoagulation protocol: Yes   Plan:  -Heparin 1400 units/hr -1000 aPTT/HL  Narda Bonds, PharmD, BCPS Clinical Pharmacist Phone: [phone]

## 2017-09-16 NOTE — Progress Notes (Signed)
Patient transferred from 4-west to [phone]. Patient A&Ox4. Telemetry box 09 applied and second verified. Skin assessment completed by 2 RNs. Patient instructed how to use the callbell. Callbell within reach. Admission handbook given to patient. Will continue to monitor and treat per MD orders.

## 2017-09-16 NOTE — Progress Notes (Addendum)
Results for ARTIA, SINGLEY (MRN [id number]) as of 09/16/2017 20:59  Ref. Range 09/16/2017 19:23  APTT Latest Ref Range: 24 - 36 seconds 166 (HH)   Notified by lab of critical results, as above. Heparin gtt stopped, per [facility] at this time. Per pharmacy, will stop gtt now and manage hepain gtt per orders placed. No signs of bleeding at this time. Will continue to monitor.

## 2017-09-16 NOTE — Progress Notes (Signed)
Inpatient Rehabilitation Admissions Coordinator  Noted patient admitted and discharged same day from CIR due to intractable nausea and vomiting. I will follow to assist with planning dispo when medically ready.Recommend PT and OT evals to assist with mobilizing patient as soon as possible.  Danne Baxter, RN, MSN Rehab Admissions Coordinator [phone] 09/16/2017 4:54 PM

## 2017-09-16 NOTE — Discharge Summary (Signed)
NAME: Sarah [name], Sarah [name] MEDICAL RECORD [id number] ACCOUNT [id number] DATE OF BIRTH:Aug 03, 1965 FACILITY: [facility] LOCATION: [facility] PHYSICIAN:ZACHARY Naaman Plummer, MD  DISCHARGE SUMMARY  DATE OF DISCHARGE:  09/16/2017  DATE OF ADMISSION:  09/15/2017  DATE OF DISCHARGE:  09/16/2017    DISCHARGE DIAGNOSES: 1.  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 2.  Chronic Xarelto for pulmonary emboli, deep venous thrombosis.   3.  Pain management. 4.  Left femoral shaft subacute fracture from recent fall.   5.  Acute on chronic anemia. 6.  Hypertension. 7.  Neurogenic bowel and bladder with constipation.  HOSPITAL COURSE:  A 52 year old right-handed female well known to rehab services.  Lives with her sister and brother-in-law, 2 previous back surgeries in 2015 and 2017 for thoracic T3 tumor.  Presented 09/02/2017 after a recent fall.  Initially was to  see her oncologist, but could not due to lower extremity weakness.  MRI and imaging revealed tumor recurrence of T3 with spinal cord compression.  Underwent resection 09/03/2017 per Dr. Christella Noa.  Follow up with radiation oncology for planned radiation  therapy in the future.  Initially on Arixtra for DVT prophylaxis.  Plan to resume Xarelto.  Follow up hemoglobin 6.2, transfused.  Xarelto was held.  CT lumbar, thoracic and pelvis was unremarkable.  Hemoglobin rebounded fine at 8.7.  Ongoing complaints  of left knee pain.  Noted recent fall prior to hospital admission.  X-rays revealed left femoral shaft fracture.  Underwent intramedullary femoral nailing 06/21 per Dr. Marcelino Scot.  The patient had been in inpatient rehab services prior to departure for noted  findings of left femoral shaft fracture.  Her Xarelto was resumed.  Close monitoring of hemoglobin and hematocrit.  Noted ongoing bouts of constipation with history of neurogenic bowel and bladder.  She was readmitted to inpatient rehab services on  09/15/2017.  PAST MEDICAL  HISTORY:  See discharge diagnoses.  SOCIAL HISTORY:  Lives with family.  PHYSICAL EXAMINATION: VITAL SIGNS:  Blood pressure 117/76, pulse 69, temperature 98, respirations 15. GENERAL:  Alert female in no acute distress.  EOMs intact. NECK:  Supple, nontender.  No JVD. CARDIOVASCULAR:  Rate controlled. ABDOMEN:  Mildly distended.  She did have bowel sounds.  REHABILITATION HOSPITAL COURSE:  The patient was to be admitted to inpatient rehabilitation 09/15/2017.  During that evening noted increased nausea and vomiting, noted constipation with history of neurogenic bowel and bladder.  Bowel program was  adjusted.  Ongoing intractable nausea and vomiting.  X-rays of the abdomen revealed nonobstructive bowel gas pattern and moderate colorectal stool volume indicating constipation.  Due to intractable nausea and vomiting, she was discharged to acute care  services in stable condition.  All medication changes made as per medical team.  TN/NUANCE D:09/16/2017 T:09/16/2017 JOB:[id number]

## 2017-09-16 NOTE — Progress Notes (Signed)
Soap sud enema given with positive results. Patient had a large brown bowel movement from the rectum. Will continue to monitor and treat per MD orders.

## 2017-09-16 NOTE — Progress Notes (Addendum)
ANTICOAGULATION CONSULT NOTE - Follow Up Consult  Pharmacy Consult for Heparin Indication: h/o PE  Allergies  Allergen Reactions  . Bee Venom Anaphylaxis  . Keflex [Cephalexin] Nausea And Vomiting  . Tramadol Nausea And Vomiting  . Gabapentin Nausea And Vomiting  . Heparin Other (See Comments)    Weak positive platelets induced antibodies, SRA negative--2015  . Hydrochlorothiazide Nausea And Vomiting  . Hydrocodone Nausea And Vomiting  . Oxycodone-Acetaminophen Nausea And Vomiting    Patient Measurements: Height : 5 ft 11 in. Weight: 237 lb 14 oz (107.9 kg) Heparin Dosing Weight:   94.3 kg  Vital Signs: Temp: 98.1 F (36.7 C) (06/26 1400) Temp Source: Oral (06/26 1400) BP: 134/80 (06/26 1400) Pulse Rate: 85 (06/26 1400)  Labs: Recent Labs    09/14/17 0407  09/14/17 0848 09/15/17 2328 09/16/17 0617 09/16/17 0742 09/16/17 1010 09/16/17 1923  HGB  --    < > 8.0* 8.4*  --  8.1*  --   --   HCT  --   --  25.5* 26.2*  --  25.7*  --   --   PLT  --   --  166 176  --  212  --   --   APTT  --   --   --  24  --   --  136* 166*  LABPROT  --   --   --  14.8  --   --   --   --   INR  --   --   --  1.17  --   --   --   --   HEPARINUNFRC  --   --   --   --   --   --  1.34*  --   CREATININE 1.02*  --   --  1.08* 0.96  --   --   --   TROPONINI  --   --   --  <0.03  --   --   --   --    < > = values in this interval not displayed.    Estimated Creatinine Clearance: 93.7 mL/min (by C-G formula based on SCr of 0.96 mg/dL).  Assessment: CC/HPI: Tx from inpatient rehab with intractable N/V.  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  Rehab 6/25. Developed persistent N/V with LUQ abdominal pain. Paraplegic  Anticoag: Xarelto PTA for VTE 2018 (LD 6/24). Cannot swallow and transitioned to IV heparin.   6 hour aPTT tonight is 166 seconds on heparin IV rate 1200 units/hr.  No bleeding per RN.  Also confirmed heparin rate with RN was infusing at 1200 units/hr.     Goal of Therapy:  aPTT 66-102 seconds Monitor platelets by anticoagulation protocol: Yes   Plan:  Stop heparin drip, hold heparin x 2 hours then will resume heparin drip at lower rate of 900 units/hr  ( a decrease of ~3 units/kg/hr) Recheck aPTT in 6 hrs. Daily HL , aPTT, and CBC in AM   Nicole Cella, [place] Clinical Pharmacist Phone: [phone] 8a-330p 3:30p-1030p [phone] or call main pharmacy [phone]   09/16/2017,9:10 PM

## 2017-09-16 NOTE — Progress Notes (Signed)
Final report to [place], Therapist, sports, pt being transported to [facility] room 33

## 2017-09-16 NOTE — IPOC Note (Signed)
No IPOC needed

## 2017-09-16 NOTE — Progress Notes (Signed)
Pt complaining of nausea and actively gagging and spitting up. Administered PRN dose of 4mg  IV zofran at 1449. Pt went down for an abdominal ultrasound and came back still complaining of nausea. Paged Dr. Allyson Sabal and notified of nausea and vomitting, MD placed an order for PRN 12.5mg  phenergan q6. Will administer to pt and continue to monitor nausea and vomiting.

## 2017-09-16 NOTE — Discharge Summary (Deleted)
  The note originally documented on this encounter has been moved the the encounter in which it belongs.  

## 2017-09-16 NOTE — Progress Notes (Signed)
ANTICOAGULATION CONSULT NOTE - Follow Up Consult  Pharmacy Consult for Heparin Indication: h/o PE  Allergies  Allergen Reactions  . Bee Venom Anaphylaxis  . Keflex [Cephalexin] Nausea And Vomiting  . Tramadol Nausea And Vomiting  . Gabapentin Nausea And Vomiting  . Heparin Other (See Comments)    Weak positive platelets induced antibodies, SRA negative--2015  . Hydrochlorothiazide Nausea And Vomiting  . Hydrocodone Nausea And Vomiting  . Oxycodone-Acetaminophen Nausea And Vomiting    Patient Measurements: Weight: 237 lb 14 oz (107.9 kg) Heparin Dosing Weight:    Vital Signs: Temp: 98 F (36.7 C) (06/26 0523) Temp Source: Oral (06/26 0523) BP: 136/87 (06/26 0523) Pulse Rate: 80 (06/26 0523)  Labs: Recent Labs    09/14/17 0407  09/14/17 0848 09/15/17 2328 09/16/17 0617 09/16/17 0742 09/16/17 1010  HGB  --    < > 8.0* 8.4*  --  8.1*  --   HCT  --   --  25.5* 26.2*  --  25.7*  --   PLT  --   --  166 176  --  212  --   APTT  --   --   --  24  --   --  136*  LABPROT  --   --   --  14.8  --   --   --   INR  --   --   --  1.17  --   --   --   HEPARINUNFRC  --   --   --   --   --   --  1.34*  CREATININE 1.02*  --   --  1.08* 0.96  --   --   TROPONINI  --   --   --  <0.03  --   --   --    < > = values in this interval not displayed.    Estimated Creatinine Clearance: 93.7 mL/min (by C-G formula based on SCr of 0.96 mg/dL).  Assessment: CC/HPI: Tx from inpatient rehab with intractable N/V.  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 Rehab 6/25. Developed persistent N/V with LUQ abdominal pain. Paraplegic  Anticoag: Xarelto  PTA for VTE 2018 (LD 6/24). Cannot swallow and transitioned to IV heparin.  APTT 136, HL 1.34, Hgb 8.1 stable. Plts 212 ok.   Goal of Therapy:  aPTT 66-102 seconds Monitor platelets by anticoagulation protocol: Yes   Plan:  Decrease IV heparin to 1200 units/hr Recheck aPTT in 6 hrs. Daily HL , aPTT, and CBC in AM   Sarah [name], PharmD, BCPS Clinical Staff Pharmacist Pager [phone]  Eilene Ghazi Stillinger 09/16/2017,12:19 PM

## 2017-09-16 NOTE — Progress Notes (Signed)
Pt has relief from ativan, restful now, wakeful but states no nausea

## 2017-09-16 NOTE — Progress Notes (Signed)
Patient seen and examined  52 year old female with a history of metastatic thyroid cancer, PE, hypertension, resection of T3 spinal tumor and intramedullary nailing, transferred from inpatient rehabilitation for intractable nausea vomiting and constipation  Assessment and plan  1. Intractable nausea vomiting -  X-ray of the abdomen shows moderate stool burden for which I has been started on aggressive constipation regimen.   continue full liquid diet.  If patient's pain and vomiting does not improve will consider CT scan of the abdomen.  Continue with hydration.Dulcolar and stool softeners , feels like she can have  A BM  2. 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 3. History of PE on Xarelto -since patient is not able to swallow I have plan to place patient on argatroban.  Since patient's allergies such as patient has history of HIV.  But pharmacy had called me back and stated that patient does not have a history of HIT since her antibodies were negative.  And at this time per pharmacy patient placed on heparin until patient can reliably take orally Xarelto. 4. 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 5. Hypertension we will keep patient on PRN IV hydralazine. 6. Recent left femur fracture status post intramedullary nailing. 7. Hyperglycemia likely from steroids on CBG with sliding scale coverage.  8. Anemia  -had received transfusion last week.  Follow CBC. 9. Hypocalcemia-patient given 1 dose of calcium gluconate, magnesium 2.2

## 2017-09-16 NOTE — H&P (Signed)
History and Physical    Sarah [name] [id number] DOB: 02/19/1966 DOA: 09/16/2017  PCP: [name], MD  Patient coming from: Rehab.  Chief Complaint: Nausea vomiting.  HPI: Sarah [name] is a 52 y.o. 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  Patient was found to have persistent nausea vomiting with left upper quadrant abdominal pain.  Patient had multiple episodes of vomiting intractable.  Hospitalist was called for further evaluation.  At the time of my exam patient states he has been in constant pain in the left upper quadrant denies any chest pain shortness of breath.  X-ray revealed moderate stool burden in the colon.  Otherwise unremarkable.  Abdomen appears benign on exam.  Since patient is having persistent vomiting patient transferred to inpatient admission to hospital.  ED Course: [name] a direct admit.  Review of Systems: As per HPI, rest all negative.   Past Medical History:  Diagnosis Date  . 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 Chronic back pain   . DDD (degenerative disc disease), cervical   . DJD (degenerative joint disease)   . Family history of adverse reaction to anesthesia    MOTHER HAS NAUSEA   . History of rectal fissure   . HIT (heparin-induced thrombocytopenia) ([place])   . Hypertension   . Obesity   . 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 Pulmonary embolus, right ([place]) 2015  . Rotator cuff tendonitis right    Past Surgical History:  Procedure Laterality Date  . FEMUR IM NAIL Left 09/11/2017   Procedure: INTRAMEDULLARY (IM)  RETROGRADE FEMORAL NAILING;  Surgeon: Altamese [place], MD;  Location: [facility];  Service: Orthopedics;  Laterality: Left;  . LAMINECTOMY N/A 12/14/2013   Procedure: THORACIC LAMINECTOMY FOR TUMOR THORACIC THREE;  Surgeon: Ashok Pall, MD;  Location: [place] [facility];  Service: Neurosurgery;  Laterality: N/A;  THORACIC LAMINECTOMY FOR TUMOR THORACIC THREE  . LAMINECTOMY N/A 07/05/2015   Procedure: THORACIC LAMINECTOMY FOR TUMOR;  Surgeon: Ashok Pall, MD;  Location: [place] NEURO ORS;  Service: Neurosurgery;  Laterality: N/A;  . LAMINECTOMY N/A 09/03/2017   Procedure: POSTERIOR SPINAL TUMOR RESECTION THORACIC THREE;  Surgeon: Ashok Pall, MD;  Location: [place];  Service: Neurosurgery;  Laterality: N/A;  POSTERIOR SPINAL TUMOR RESECTION THORACIC THREE  . POSTERIOR LUMBAR FUSION 4 LEVEL N/A 12/30/2013   Procedure: Thoracic one-Thoracic five posterior thoracic fusion with pedicle screws;  Surgeon: Ashok Pall, MD;  Location: [place] NEURO ORS;  Service: Neurosurgery;  Laterality: N/A;  Thoracic one-Thoracic five posterior thoracic fusion with pedicle screws  . THYROIDECTOMY N/A 01/09/2014   Procedure: TOTAL THYROIDECTOMY;  Surgeon: Izora Gala, MD;  Location: [facility];  Service: ENT;  Laterality: N/A;  . TONSILLECTOMY       reports that she has never smoked. She has never used smokeless tobacco. She reports that she does not drink alcohol or use drugs.  Allergies  Allergen Reactions  . Bee Venom Anaphylaxis  . Keflex [Cephalexin] Nausea And Vomiting  . Tramadol Nausea And Vomiting  . Gabapentin Nausea And Vomiting  . Heparin Other (See Comments)    Weak positive platelets  induced antibodies, SRA negative--2015  . Hydrochlorothiazide Nausea And Vomiting  . Hydrocodone Nausea And Vomiting  . Oxycodone-Acetaminophen Nausea And Vomiting    Family History  Problem Relation Age of Onset  . Hypertension Mother   . Diabetes Mother   . Hypertension Father   . Stroke Father   . Diabetes Father   . Diabetes Sister     . Diabetes Sister     Prior to Admission medications   Medication Sig Start Date End Date Taking? Authorizing Provider  acetaminophen (TYLENOL) 325 MG tablet Take 2 tablets (650 mg total) by mouth every 4 (four) hours as needed for mild pain ((score 1 to 3) or temp > 100.5). 09/11/17   Angiulli, Lavon Paganini, PA-C  baclofen (LIORESAL) 20 MG tablet TAKE (1) TABLET THREE TIMES DAILY. 08/31/17   [name], MD  bisacodyl (DULCOLAX) 10 MG suppository Place 1 suppository (10 mg total) rectally at bedtime. 09/11/17   Angiulli, Lavon Paganini, PA-C  dexamethasone (DECADRON) 4 MG tablet Take 1 tablet (4 mg total) by mouth every 6 (six) hours. 09/11/17   Angiulli, Lavon Paganini, PA-C  diazepam (VALIUM) 5 MG tablet Take 1 tablet (5 mg total) by mouth every 6 (six) hours as needed for muscle spasms. 09/11/17   Angiulli, Lavon Paganini, PA-C  fluticasone (FLONASE) 50 MCG/ACT nasal spray Place 2 sprays into both nostrils daily. 04/01/17   Carlyle Dolly, MD  furosemide (LASIX) 20 MG tablet Take 1 tablet (20 mg total) by mouth daily as needed. Patient taking differently: Take 20 mg by mouth daily as needed for fluid.  09/05/16   Brunetta Genera, MD  HYDROcodone-acetaminophen (NORCO/VICODIN) 5-325 MG tablet Take 1-2 tablets by mouth every 4 (four) hours as needed for moderate pain. 09/11/17   Angiulli, Lavon Paganini, PA-C  levothyroxine (SYNTHROID, LEVOTHROID) 137 MCG tablet Take 137 mcg by mouth daily before breakfast.    [provider]  lisinopril (PRINIVIL,ZESTRIL) 20 MG tablet Take 2 tablets (40 mg total) by mouth daily. 01/02/17   Brunetta Genera, MD  ondansetron (ZOFRAN) 4 MG tablet Take 1 tablet (4 mg total) by mouth every 6 (six) hours. 06/22/17   [name], MD  pantoprazole (PROTONIX) 40 MG tablet Take 1 tablet (40 mg total) by mouth daily. 07/16/17   [name], MD  senna (SENOKOT) 8.6 MG TABS tablet Take 2 tablets (17.2 mg total) by mouth daily. 05/13/16   Bayard Hugger, NP  sertraline  (ZOLOFT) 25 MG tablet Take 1 tablet (25 mg total) by mouth daily. 05/06/17   [name], MD  sulfamethoxazole-trimethoprim (BACTRIM DS,SEPTRA DS) 800-160 MG tablet Take 1 tablet by mouth every 12 (twelve) hours. 09/11/17   Angiulli, Lavon Paganini, PA-C  traZODone (DESYREL) 50 MG tablet Take 1 tablet (50 mg total) by mouth at bedtime as needed for sleep. 09/11/17   Angiulli, Lavon Paganini, PA-C    Physical Exam: There were no vitals filed for this visit.    Constitutional: Moderately built and nourished. There were no vitals filed for this visit.  Blood pressure is 120/80.  Pulse is 80/min temperature 97.4 respiration 18/min. Eyes: Anicteric no pallor. ENMT: No discharge from the ears eyes nose or mouth. Neck: No mass palpated no neck rigidity.  No JVD appreciated. Respiratory: No rhonchi or crepitations. Cardiovascular: S1-S2 heard no murmurs appreciated. Abdomen: Soft nontender bowel sounds present. Musculoskeletal: Bilateral lower extremity edema and extremities appears cold but pulses dopplerable. Skin: No rash. Neurologic: Alert awake oriented to time  place and person.  Has paraplegia. Psychiatric: Appears normal per normal affect.   Labs on Admission: I have personally reviewed following labs and imaging studies  CBC: Recent Labs  Lab 09/09/17 0619  09/10/17 0523 09/11/17 0640 09/12/17 0351 09/14/17 0848 09/15/17 2328  WBC 11.0*  --  12.3* 11.0* 11.4* 8.6 11.4*  NEUTROABS 9.1*  --   --   --   --   --  9.5*  HGB 6.2*   < > 8.7* 8.7* 7.6* 8.0* 8.4*  HCT 19.6*   < > 26.8* 26.7* 24.0* 25.5* 26.2*  MCV 95.1  --  93.4 94.0 96.0 98.1 95.6  PLT 184  --  169 163 143* 166 176   < > = values in this interval not displayed.   Basic Metabolic Panel: Recent Labs  Lab 09/09/17 0619 09/12/17 0351 09/13/17 0422 09/14/17 0407 09/15/17 2328  NA 138 137 137 136 135  K 4.3 4.8 4.7 4.9 5.1  CL 109 104 105 106 102  CO2 26 28 27 27 23   GLUCOSE 172* 173* 178* 189* 159*  BUN 26* 25* 24*  27* 34*  CREATININE 1.00 1.03* 0.98 1.02* 1.08*  CALCIUM 7.2* 7.2* 7.0* 7.2* 7.7*   GFR: Estimated Creatinine Clearance: 82.5 mL/min (A) (by C-G formula based on SCr of 1.08 mg/dL (H)). Liver Function Tests: Recent Labs  Lab 09/09/17 0619 09/15/17 2328  AST 22 20  ALT 18 17  ALKPHOS 146* 151*  BILITOT 0.6 1.4*  PROT 3.8* 4.7*  ALBUMIN 2.1* 2.5*   Recent Labs  Lab 09/15/17 2328  LIPASE 25   No results for input(s): AMMONIA in the last 168 hours. Coagulation Profile: Recent Labs  Lab 09/15/17 2328  INR 1.17   Cardiac Enzymes: Recent Labs  Lab 09/15/17 2328  TROPONINI <0.03   BNP (last 3 results) No results for input(s): PROBNP in the last 8760 hours. HbA1C: No results for input(s): HGBA1C in the last 72 hours. CBG: Recent Labs  Lab 09/12/17 1208  GLUCAP 182*   Lipid Profile: No results for input(s): CHOL, HDL, LDLCALC, TRIG, CHOLHDL, LDLDIRECT in the last 72 hours. Thyroid Function Tests: Recent Labs    09/15/17 0635  TSH 0.036*  FREET4 0.90   Anemia Panel: No results for input(s): VITAMINB12, FOLATE, FERRITIN, TIBC, IRON, RETICCTPCT in the last 72 hours. Urine analysis:    Component Value Date/Time   COLORURINE YELLOW 09/02/2017 1646   APPEARANCEUR TURBID (A) 09/02/2017 1646   LABSPEC 1.013 09/02/2017 1646   PHURINE 5.0 09/02/2017 1646   GLUCOSEU NEGATIVE 09/02/2017 1646   HGBUR SMALL (A) 09/02/2017 1646   BILIRUBINUR NEGATIVE 09/02/2017 1646   BILIRUBINUR MODERATE 03/03/2014 1028   KETONESUR 5 (A) 09/02/2017 1646   PROTEINUR 30 (A) 09/02/2017 1646   UROBILINOGEN 1.0 03/11/2014 2130   NITRITE POSITIVE (A) 09/02/2017 1646   LEUKOCYTESUR LARGE (A) 09/02/2017 1646   Sepsis Labs: @LABRCNTIP (procalcitonin:4,lacticidven:4) )No results found for this or any previous visit (from the past 240 hour(s)).   Radiological Exams on Admission: Dg Abd 1 View  Result Date: 09/15/2017 CLINICAL DATA:  Abdominal pain, nausea and vomiting EXAM: ABDOMEN - 1  VIEW COMPARISON:  08/09/2017 CT abdomen/pelvis FINDINGS: No dilated small bowel loops. Moderate colorectal stool volume. No evidence of pneumatosis or pneumoperitoneum. No radiopaque nephrolithiasis. Mild levocurvature of the lumbar spine. Marked lumbar spine degenerative changes. Partially visualized surgical fixation hardware in the proximal left femur. IMPRESSION: Nonobstructive bowel gas pattern. Moderate colorectal stool volume, which may indicate constipation. Electronically Signed  By: Ilona Sorrel M.D.   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 Intractable nausea vomiting -cause not clear.  X-ray of the abdomen shows moderate stool burden for which I have ordered soapsuds enema.  Will keep patient on clear liquid diet.  If patient's pain and vomiting does not improve will consider CT scan of the abdomen.  Continue with hydration. 2. 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 3. History of PE on Xarelto -since patient is not able to swallow I have plan to place patient on argatroban.  Since patient's allergies such as patient has history of HIV.  But pharmacy had called me back and stated that patient does not have a history of HIT since her antibodies were negative.  And at this time per pharmacy patient placed on heparin until patient can reliably take orally Xarelto. 4. 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 5. Hypertension we will keep patient on PRN IV hydralazine. 6. Recent left  femur fracture status post intramedullary nailing. 7. Hyperglycemia likely from steroids on CBG with sliding scale coverage.  8. Anemia  -had received transfusion last week.  Follow CBC.   DVT prophylaxis: Heparin. Code Status: Full code. Family Communication: Discussed with patient. Disposition Plan: Skilled nursing facility. Consults called: None. Admission status: Inpatient.   Rise Patience MD [facility] Pager [phone].  If 7PM-7AM, please contact night-coverage [email or web address] Password [facility]  09/16/2017, 12:48 AM

## 2017-09-17 DIAGNOSIS — F502 Bulimia nervosa: Secondary | ICD-10-CM

## 2017-09-17 LAB — COMPREHENSIVE METABOLIC PANEL
ALBUMIN: 2.5 g/dL — AB (ref 3.5–5.0)
ALT: 20 U/L (ref 0–44)
AST: 20 U/L (ref 15–41)
Alkaline Phosphatase: 137 U/L — ABNORMAL HIGH (ref 38–126)
Anion gap: 6 (ref 5–15)
BUN: 26 mg/dL — ABNORMAL HIGH (ref 6–20)
CHLORIDE: 101 mmol/L (ref 98–111)
CO2: 27 mmol/L (ref 22–32)
CREATININE: 1.08 mg/dL — AB (ref 0.44–1.00)
Calcium: 7.6 mg/dL — ABNORMAL LOW (ref 8.9–10.3)
GFR calc non Af Amer: 58 mL/min — ABNORMAL LOW (ref >60)
Glucose, Bld: 118 mg/dL — ABNORMAL HIGH (ref 70–99)
Potassium: 5.3 mmol/L — ABNORMAL HIGH (ref 3.5–5.1)
SODIUM: 134 mmol/L — AB (ref 135–145)
Total Bilirubin: 1.6 mg/dL — ABNORMAL HIGH (ref 0.3–1.2)
Total Protein: 4.6 g/dL — ABNORMAL LOW (ref 6.5–8.1)

## 2017-09-17 LAB — CBC
HCT: 25.1 % — ABNORMAL LOW (ref 36.0–46.0)
Hemoglobin: 7.9 g/dL — ABNORMAL LOW (ref 12.0–15.0)
MCH: 30.3 pg (ref 26.0–34.0)
MCHC: 31.5 g/dL (ref 30.0–36.0)
MCV: 96.2 fL (ref 78.0–100.0)
PLATELETS: 194 10*3/uL (ref 150–400)
RBC: 2.61 MIL/uL — AB (ref 3.87–5.11)
RDW: 17.5 % — ABNORMAL HIGH (ref 11.5–15.5)
WBC: 8.7 10*3/uL (ref 4.0–10.5)

## 2017-09-17 LAB — GLUCOSE, CAPILLARY
GLUCOSE-CAPILLARY: 115 mg/dL — AB (ref 70–99)
GLUCOSE-CAPILLARY: 115 mg/dL — AB (ref 70–99)
GLUCOSE-CAPILLARY: 119 mg/dL — AB (ref 70–99)
Glucose-Capillary: 107 mg/dL — ABNORMAL HIGH (ref 70–99)
Glucose-Capillary: 110 mg/dL — ABNORMAL HIGH (ref 70–99)
Glucose-Capillary: 126 mg/dL — ABNORMAL HIGH (ref 70–99)
Glucose-Capillary: 96 mg/dL (ref 70–99)

## 2017-09-17 LAB — APTT
APTT: 83 s — AB (ref 24–36)
aPTT: 75 seconds — ABNORMAL HIGH (ref 24–36)

## 2017-09-17 LAB — HEPARIN LEVEL (UNFRACTIONATED): HEPARIN UNFRACTIONATED: 0.72 [IU]/mL — AB (ref 0.30–0.70)

## 2017-09-17 MED ORDER — LEVOTHYROXINE SODIUM 137 MCG PO TABS
137.0000 ug | ORAL_TABLET | Freq: Every day | ORAL | Status: DC
  Administered 2017-09-17 – 2017-09-21 (×5): 137 ug via ORAL
  Filled 2017-09-17 (×5): qty 1

## 2017-09-17 MED ORDER — PANTOPRAZOLE SODIUM 40 MG PO TBEC
40.0000 mg | DELAYED_RELEASE_TABLET | Freq: Every day | ORAL | Status: DC
  Administered 2017-09-17 – 2017-09-21 (×5): 40 mg via ORAL
  Filled 2017-09-17 (×5): qty 1

## 2017-09-17 MED ORDER — SODIUM CHLORIDE 0.9 % IV SOLN
2.0000 g | Freq: Once | INTRAVENOUS | Status: AC
  Administered 2017-09-17: 2 g via INTRAVENOUS
  Filled 2017-09-17: qty 20

## 2017-09-17 MED ORDER — FAMOTIDINE 20 MG PO TABS
20.0000 mg | ORAL_TABLET | Freq: Two times a day (BID) | ORAL | Status: DC
  Administered 2017-09-17 – 2017-09-21 (×7): 20 mg via ORAL
  Filled 2017-09-17 (×8): qty 1

## 2017-09-17 MED ORDER — RIVAROXABAN 20 MG PO TABS
20.0000 mg | ORAL_TABLET | Freq: Every day | ORAL | Status: DC
  Administered 2017-09-17 – 2017-09-20 (×4): 20 mg via ORAL
  Filled 2017-09-17 (×4): qty 1

## 2017-09-17 MED ORDER — SODIUM CHLORIDE 0.9 % IV SOLN
INTRAVENOUS | Status: DC
  Administered 2017-09-17 – 2017-09-20 (×4): via INTRAVENOUS

## 2017-09-17 NOTE — Progress Notes (Signed)
ANTICOAGULATION CONSULT NOTE - Follow Up Consult  Pharmacy Consult for heparin Indication: h/o PE  Labs: Recent Labs    09/15/17 2328 09/16/17 0617 09/16/17 0742 09/16/17 1010 09/16/17 1923 09/17/17 0539  HGB 8.4*  --  8.1*  --   --  7.9*  HCT 26.2*  --  25.7*  --   --  25.1*  PLT 176  --  212  --   --  194  APTT 24  --   --  136* 166* 75*  LABPROT 14.8  --   --   --   --   --   INR 1.17  --   --   --   --   --   HEPARINUNFRC  --   --   --  1.34*  --   --   CREATININE 1.08* 0.96  --   --   --  1.08*  TROPONINI <0.03  --   --   --   --   --     Assessment/Plan:  52yo hemale therapeutic on heparin after rate change. Will continue gtt at current rate and confirm stable with additional PTT.   Wynona Neat, PharmD, BCPS  09/17/2017,6:44 AM

## 2017-09-17 NOTE — Progress Notes (Signed)
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 Constipation improved, patient also had right upper quadrant pain and had a negative gallbladder ultrasound.  Assessment and plan  1. Intractable nausea vomiting- X-ray of the abdomen shows moderate stool burden  , started on aggressive constipation regimen.  continue full liquid diet. If patient's pain and vomiting  recurs, consider CT scan of the abdomen. Continue with hydration.Dulcolar and stool softeners ,  with good results, advanced patient's diet . 2. Metastatic thyroid cancer to the spine status post recent resection on Decadron which  is dosed IV until patient can take orally reliably. 3. History of PE on Xarelto-  patient does not have a history of HIT as initially suspected, since her antibodies were negative.  Therefore patient started on IV heparin per pharmacy   until patient can reliably take orally Xarelto. 4. Postoperative hypothyroidism on Synthroid    5. Hypertension we will keep patient on PRN IV hydralazine. 6. Recent left femur fracture status post intramedullary nailing. 7. Hyperglycemia likely from steroids on CBG with sliding scale coverage.  8. Anemia-had received transfusion last week.  Hemoglobin in the 8 range for several  days, now 7.9, follow closely. PPI for GI protection 9. Hypocalcemia-patient given 1 dose of calcium gluconate, corrected calcium  8.8, will give 2 g  of calcium gluconate magnesium 2.2     DVT prophylaxsis heparin gtt  Code Status:  Full code   Family Communication: Discussed in detail with the patient, all imaging results, lab results explained to the patient   Disposition Plan:   Pending clinical improvement, PT OT evaluation      Consultants:  None  Procedures:  None  Antibiotics: Anti-infectives (From admission, onward)   None         HPI/Subjective: She says nausea is better , would like to try solid diet   Objective: Vitals:   09/16/17 1400 09/16/17 2100 09/16/17 2139 09/17/17 0510  BP: 134/80  (!) 150/78 129/80  Pulse: 85  88 82  Resp: 18  18 18   Temp: 98.1 F (36.7 C)  99.2 F (37.3 C) 99.2 F (37.3 C)  TempSrc: Oral     SpO2: 100%  100% 100%  Weight:      Height:  5\' 11"  (1.803 m)      Intake/Output Summary (Last 24 hours) at 09/17/2017 0818 Last data filed at 09/17/2017 0533 Gross per 24 hour  Intake 1443.78 ml  Output 2900 ml  Net -1456.22 ml    Exam:  Examination:  General exam: Appears calm and comfortable  Respiratory system: Clear to auscultation. Respiratory effort normal. Cardiovascular system: S1 & S2 heard, RRR. No JVD, murmurs, rubs, gallops or clicks. No pedal edema. Gastrointestinal system: Abdomen is nondistended, soft and nontender. No organomegaly or masses felt. Normal bowel sounds heard. Central nervous system: Alert  and oriented. No focal neurological deficits. Extremities: Symmetric 5 x 5 power. Skin: No rashes, lesions or ulcers Psychiatry: Judgement and insight appear normal. Mood & affect appropriate.     Data Reviewed: I have personally reviewed following labs and imaging studies  Micro Results No results found for this or any previous visit (from the past 240 hour(s)).  Radiology Reports Dg Shoulder  Right  Result Date: 09/02/2017 CLINICAL DATA:  Pain following fall EXAM: RIGHT SHOULDER - 2+ VIEW COMPARISON:  None. FINDINGS: Frontal and Y scapular images were obtained. There is no appreciable fracture or dislocation. There is moderate generalized osteoarthritic change. No erosive change or intra-articular calcification. Visualized right lung is clear. Postoperative changes present in the upper thoracic region. 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 No fracture or dislocation. Electronically Signed   By: Lowella Grip III M.D.   On: 09/02/2017 14:04   Dg Knee 1-2 Views Left  Result Date: 09/10/2017 CLINICAL DATA:  Pain LEFT knee, recent fall on 09/02/2017 EXAM: LEFT KNEE - 1-2 VIEW COMPARISON:  None FINDINGS: Osseous demineralization. Joint spaces preserved. Oblique fracture of the distal LEFT femoral metadiaphysis mildly displaced medially and posteriorly. No additional fracture, dislocation or bone destruction. No knee joint effusion. IMPRESSION: Displaced oblique distal LEFT femoral metadiaphyseal fracture. Findings called to [place] on [facility] on 09/10/2017 at 1317 hours. Electronically Signed   By: Lavonia Dana M.D.   On: 09/10/2017 13:17   Dg Abd 1 View  Result Date: 09/15/2017 CLINICAL DATA:  Abdominal pain, nausea and vomiting EXAM: ABDOMEN - 1 VIEW COMPARISON:  08/09/2017 CT abdomen/pelvis FINDINGS: No dilated small bowel loops. Moderate colorectal stool volume. No evidence of pneumatosis or pneumoperitoneum. No radiopaque nephrolithiasis. Mild levocurvature of the lumbar spine. Marked lumbar spine degenerative changes. Partially visualized surgical fixation hardware in the proximal left femur. IMPRESSION: Nonobstructive bowel gas pattern. Moderate colorectal stool volume, which may indicate constipation. Electronically Signed   By: Ilona Sorrel M.D.   On: 09/15/2017 22:33   Ct Head Wo Contrast  Result Date: 09/02/2017 CLINICAL DATA:  Status post  fall moving from a bed to a wheelchair today. Initial encounter. EXAM: CT HEAD WITHOUT CONTRAST CT CERVICAL SPINE WITHOUT CONTRAST TECHNIQUE: Multidetector CT imaging of the head and cervical spine was performed following the standard protocol without intravenous contrast. Multiplanar CT image reconstructions of the cervical spine were also generated. COMPARISON:  Head CT scan 12/22/2016.  PET CT scan 05/20/2017. FINDINGS: CT HEAD FINDINGS Brain: No evidence of acute infarction, hemorrhage, hydrocephalus, extra-axial collection or mass lesion/mass effect. Vascular: A few atherosclerotic calcifications are seen. Skull: Intact.  No focal lesion. Sinuses/Orbits: Mild mucosal thickening is noted in the maxillary sinuses, frontal sinuses, sphenoid sinus and scattered ethmoid air cells. Other: None. CT CERVICAL SPINE FINDINGS Alignment: Maintained. Skull base and vertebrae: No acute fracture. No primary bone lesion or focal pathologic process. Soft tissues and spinal canal: No prevertebral fluid or swelling. No visible canal hematoma. Disc levels: Intervertebral disc space height is maintained. There is some endplate spurring most notable at C3-4 and C5-6. Partial visualization of thoracic fusion hardware noted. Upper chest: 0.5 cm left upper lobe pulmonary nodule is seen on prior PET CT scan is noted. Other: None. IMPRESSION: No acute abnormality head or cervical spine. Atherosclerosis. Sinus disease. Mild appearing cervical spondylosis. No change in 0.5 cm left upper lobe pulmonary nodule consistent with the patient's known metastatic thyroid carcinoma. Electronically Signed   By: Inge Rise M.D.   On:  09/02/2017 14:37   Ct Cervical Spine Wo Contrast  Result Date: 09/02/2017 CLINICAL DATA:  Status post fall moving from a bed to a wheelchair today. Initial encounter. EXAM: CT HEAD WITHOUT CONTRAST CT CERVICAL SPINE WITHOUT CONTRAST TECHNIQUE: Multidetector CT imaging of the head and cervical spine was performed  following the standard protocol without intravenous contrast. Multiplanar CT image reconstructions of the cervical spine were also generated. COMPARISON:  Head CT scan 12/22/2016.  PET CT scan 05/20/2017. FINDINGS: CT HEAD FINDINGS Brain: No evidence of acute infarction, hemorrhage, hydrocephalus, extra-axial collection or mass lesion/mass effect. Vascular: A few atherosclerotic calcifications are seen. Skull: Intact.  No focal lesion. Sinuses/Orbits: Mild mucosal thickening is noted in the maxillary sinuses, frontal sinuses, sphenoid sinus and scattered ethmoid air cells. Other: None. CT CERVICAL SPINE FINDINGS Alignment: Maintained. Skull base and vertebrae: No acute fracture. No primary bone lesion or focal pathologic process. Soft tissues and spinal canal: No prevertebral fluid or swelling. No visible canal hematoma. Disc levels: Intervertebral disc space height is maintained. There is some endplate spurring most notable at C3-4 and C5-6. Partial visualization of thoracic fusion hardware noted. Upper chest: 0.5 cm left upper lobe pulmonary nodule is seen on prior PET CT scan is noted. Other: None. IMPRESSION: No acute abnormality head or cervical spine. Atherosclerosis. Sinus disease. Mild appearing cervical spondylosis. No change in 0.5 cm left upper lobe pulmonary nodule consistent with the patient's known metastatic thyroid carcinoma. Electronically Signed   By: Inge Rise M.D.   On: 09/02/2017 14:37   Ct Thoracic Spine Wo Contrast  Result Date: 09/09/2017 CLINICAL DATA:  Neck hematoma, traumatic. Bone neoplasm. Check for local recurrence EXAM: CT THORACIC SPINE WITHOUT CONTRAST TECHNIQUE: Multidetector CT images of the thoracic were obtained using the standard protocol without intravenous contrast. COMPARISON:  Thoracic MRI 09/02/2017.  Thoracic spine CT 09/03/2017 FINDINGS: Alignment: Stable alignment with mild exaggerated thoracic kyphosis. Vertebrae: There is known malignant erosion of the T3,  T2, and T4 bodies with chronic T3 collapse. Status post remote laminectomy with more recent reopening for recurrent tumor excision. There is posterior hardware spanning T1-T5, which is well seated. No progressive erosion or interval fracture seen. Paraspinal and other soft tissues: High-density material within the incision, both subcutaneous and deep, measuring up to 16 mm thickness, 8 cm craniocaudal, and 7 cm in depth. The fluid is high-density compatible with blood products. At the level of the laminectomy, mass effect on the thecal sac would not be detectable by this technique. Degree of residual tumor is also not well established by this technique. Disc levels: Diffuse spondylosis with bridging osteophytes from T4 to T12. There are small calcified disc protrusions at T7-8 and T8-9. IMPRESSION: Interval T3 spinal canal tumor debulking with hemolymphatic material in the subcutaneous and deep incision measuring 40-50 cc. No detected change in osseous structures or posterior hardware. Electronically Signed   By: Monte Fantasia M.D.   On: 09/09/2017 12:51   Ct Thoracic Spine Wo Contrast  Result Date: 09/03/2017 CLINICAL DATA:  Metastatic Hurthle cell carcinoma to the thoracic spine. T3 metastasis with prior decompression. Worsening disease with cord compression on recent MRI. EXAM: CT THORACIC SPINE WITHOUT CONTRAST TECHNIQUE: Multidetector CT images of the thoracic were obtained using the standard protocol without intravenous contrast. COMPARISON:  Thoracic spine MRI 09/02/2017 FINDINGS: Alignment: Mildly exaggerated upper thoracic kyphosis. No listhesis. Vertebrae: Prior posterior fusion from T1-T5. Bilateral pedicle screws at each level except for T3. The screws appear intact without evidence of loosening about the T1,  T2, or T5 screws. There is lucency throughout the posterosuperior aspect of the T4 vertebral body including about the distal portions of both T4 screws reflecting tumor as seen on MRI. There  is a chronic, severe to pathologic T3 compression fracture. Extensive epidural tumor centered at the T3 level was more fully evaluated on yesterday's MRI. Tumor is again noted to involve the posterior and inferior aspects of the T2 vertebral body. There is slight chronic anterior wedging of the T8 vertebral body. No definite acute fracture is identified. Flowing anterior vertebral osteophytes are present throughout the thoracic spine extending into the lumbar spine. Paraspinal and other soft tissues: Unremarkable. Disc levels: Spondylosis more fully evaluated on yesterday's MRI, with a small calcified disc extrusion noted at T7-8. IMPRESSION: 1. Metastatic disease as described on yesterday's MRI with extensive osseous and epidural tumor from T2-T4. Chronic pathologic T3 compression fracture. 2. Prior T1-T5 posterior fusion. Tumor involves the T4 vertebral body about the distal aspects of both pedicle screws. Electronically Signed   By: Logan Bores M.D.   On: 09/03/2017 15:13   Ct Lumbar Spine Wo Contrast  Result Date: 09/09/2017 CLINICAL DATA:  Hematoma, neck.  Drop in hematocrit. EXAM: CT LUMBAR SPINE WITHOUT CONTRAST TECHNIQUE: Multidetector CT imaging of the lumbar spine was performed without intravenous contrast administration. Multiplanar CT image reconstructions were also generated. COMPARISON:  09/02/2017 MRI FINDINGS: Segmentation: 5 lumbar type vertebral bodies. Alignment: Mild levoscoliosis. Vertebrae: No evidence of fracture, discitis, or aggressive bone lesion. Paraspinal and other soft tissues: Negative for hematoma. Disc levels: Congenitally narrow spinal canal with bulky spondylitic spurring and generalized degenerative facet hypertrophy throughout the lumbar levels. The spinal canal and foramina are better assessed by recent MRI. IMPRESSION: 1. Negative for hematoma. 2. Bulky spondylosis and facet arthropathy throughout the lumbar spine, with spinal stenosis exacerbated by short pedicles. See  MRI from 1 week prior. Electronically Signed   By: Monte Fantasia M.D.   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 EXAM: CT PELVIS WITHOUT CONTRAST TECHNIQUE: Multidetector CT imaging of the pelvis was performed following the standard protocol without intravenous contrast. COMPARISON:  Lumbar spine CT today reported separately. CT Abdomen and Pelvis 08/09/2017. FINDINGS: Urinary Tract: Foley catheter in place. The urinary bladder is decompressed. The visible ureters are decompressed and unremarkable. Bowel: Retained stool in the rectosigmoid colon similar to the CT last month. Visible large and small bowel loops are nondilated. Vascular/Lymphatic: Vascular patency is not evaluated in the absence of IV contrast. No lymphadenopathy. Reproductive:  Negative. Other: No pelvic free fluid. There is increased nonspecific presacral stranding. See series 4, image 32. Since May there is new widespread nonspecific subcutaneous fat stranding throughout the lateral abdominal wall and proximal lower extremities bilaterally, but greater on the left at the level of the proximal thigh as seen on series 4, image 60. no soft tissue fluid collection in these regions. Musculoskeletal: Bulky degenerative osteophytosis in the visible lower lumbar spine and about the hips, greater on the right. Bone mineralization remains within normal limits. There is no acute, destructive, or suspicious osseous lesion identified. IMPRESSION: 1. Unchanged pelvic osseous structures since May with no acute or suspicious osseous lesion identified. 2. Nonspecific bilateral lateral abdominal wall, presacral, flank, and proximal thigh subcutaneous edema/stranding. The left proximal thigh is asymmetrically more affected as seen on series 4, image 60. Query cellulitis or anasarca. Electronically Signed   By:  Genevie Ann M.D.   On: 09/09/2017 21:17    Mr Lumbar Spine Wo Contrast  Result Date: 09/02/2017 CLINICAL DATA:  Initial evaluation for increased lower extremity weakness with history of metastatic Hurthle cell carcinoma to the thoracic spine. T3 metastasis with history of prior decompression. EXAM: MRI THORACIC AND LUMBAR SPINE WITHOUT AND WITH CONTRAST TECHNIQUE: Multiplanar and multiecho pulse sequences of the thoracic and lumbar spine were obtained without and with intravenous contrast. CONTRAST:  76mL MULTIHANCE GADOBENATE DIMEGLUMINE 529 MG/ML IV SOLN COMPARISON:  Previous MRI from 12/25/2015. FINDINGS: MRI THORACIC SPINE FINDINGS Alignment: Examination mildly limited as the patient was unable to tolerate the full length of the exam. Postcontrast axial sequences as well as post-contrast imaging through the lumbar spine was not performed. Stable alignment with preservation of the normal thoracic kyphosis. No listhesis. Vertebrae: Postoperative changes from prior fusion from T1 through T5 for T3 decompression again seen. Pathologic fracture deformity of T3 is relatively unchanged in appearance. Interval growth with progressive tumor seen within the adjacent spinal canal, extending from the mid aspect of T2 through the mid aspect of T4 (series 7, image 8). There has been marked interval worsening of cord and thecal sac progression, with the sac compressed laterally and slit like in appearance, measuring approximately 2 mm in transverse diameter at its most narrow point (series 10, image 12). Suspected cord signal abnormality at this level, although poorly visualized due to susceptibility artifact from adjacent hardware. Postoperative fluid collection again noted at the adjacent laminectomy site, likely slightly decreased in size. No other metastatic lesions identified. Reactive marrow edema and enhancement about the right T12-L1 facet favored to be degenerative in nature (series 6, image 4). Vertebral body heights otherwise maintained. Mild  discogenic reactive endplate changes present at the thoracolumbar junction. Cord:  No other cord signal abnormality identified. Paraspinal and other soft tissues: No other acute paraspinous soft tissue abnormality. Probable mild cholelithiasis noted. Visualized visceral structures otherwise unremarkable. Disc levels: T7-8: Central disc extrusion flattens the ventral thoracic spinal cord and resultant moderate spinal stenosis, stable. Left paracentral disc protrusion at T8-9 with mild flattening of the left hemi cord also unchanged. MRI LUMBAR SPINE FINDINGS Segmentation: Normal segmentation. Lowest well-formed disc labeled the L5-S1 level. Alignment: Levoscoliosis. Vertebral bodies otherwise normally aligned with preservation of the normal lumbar lordosis. No listhesis. Vertebrae: Mild height loss with reactive endplate edema at the left aspect of the superior endplate of L4, suspicious for mild compression fracture (series 14, image 12). No bony retropulsion. This is benign in appearance, with no underlying lesion identified. No worrisome osseous lesions or evidence for metastatic disease identified within the lumbar spine. Prominent reactive marrow edema about the left L3-4 facet due to advanced facet degeneration. Conus medullaris: Extends to the L1 level and appears normal. Paraspinal and other soft tissues: Paraspinous soft tissues within normal limits. Visualized visceral structures unremarkable. Disc levels: L1-2:  Mild facet and ligament flavum hypertrophy.  No stenosis. L2-3: Minimal disc bulge with bilateral facet hypertrophy. No significant stenosis. L3-4: Disc desiccation with minimal disc bulge. Prominent anterior bridging osteophyte. Advanced bilateral facet degeneration with associated reactive marrow edema, left greater than right. 7 mm synovial cyst noted at the posterior aspect of the right L3-4 facet. Mild canal with bilateral L3 foraminal stenosis. L4-5: Chronic intervertebral disc mild diffuse  disc bulge. Moderate to advanced facet arthrosis, left greater than right. Resultant moderate canal with bilateral subarticular stenosis. Foramina remain patent. L5-S1: Chronic intervertebral disc space narrowing with diffuse disc bulge. Left  greater than right facet degeneration. Resultant moderate to severe left lateral recess narrowing. Mild right with mild to moderate left L5 foraminal stenosis. IMPRESSION: MRI THORACIC SPINE IMPRESSION 1. 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 Neuro surgical consultation recommended. 2. Superimposed sequelae of prior T1 through T5 fusion for T3 decompression. 3. Stable disc protrusions at T7-8 and T8-9. 4. Prominent facet arthropathy at T12-L1 on the right with associated reactive marrow edema. MRI LUMBAR SPINE IMPRESSION 1. No evidence for metastatic disease within the lumbar spine. 2. Mild acute to subacute endplate compression deformity involving the left aspect of the superior endplate of L4. This is benign in appearance with no underlying pathologic lesion. 3. Advanced bilateral facet degeneration at L3-4 with associated reactive marrow edema, left greater than right. 4. Multilevel degenerative spondylolysis as above, most notable at L4-5 and L5-S1. Resultant moderate canal stenosis at L4-5, with moderate to severe left lateral recess narrowing at L5-S1. Critical Value/emergent results were called by telephone at the time of interpretation on 09/02/2017 at 8:30 pm to the ER PA [place] , who verbally acknowledged these results. Electronically Signed   By: Jeannine Boga M.D.   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 EXAM: MRI  THORACIC AND LUMBAR SPINE WITHOUT AND WITH CONTRAST TECHNIQUE: Multiplanar and multiecho pulse sequences of the thoracic and lumbar spine were obtained without and with intravenous contrast. CONTRAST:  62mL MULTIHANCE GADOBENATE DIMEGLUMINE 529 MG/ML IV SOLN COMPARISON:  Previous MRI from 12/25/2015. FINDINGS: MRI THORACIC SPINE FINDINGS Alignment: Examination mildly limited as the patient was unable to tolerate the full length of the exam. Postcontrast axial sequences as well as post-contrast imaging through the lumbar spine was not performed. Stable alignment with preservation of the normal thoracic kyphosis. No listhesis. Vertebrae: Postoperative changes from prior fusion from T1 through T5 for T3 decompression again seen. Pathologic fracture deformity of T3 is relatively unchanged in appearance. Interval growth with progressive tumor seen within the adjacent spinal canal, extending from the mid aspect of T2 through the mid aspect of T4 (series 7, image 8). There has been marked interval worsening of cord and thecal sac progression, with the sac compressed laterally and slit like in appearance, measuring approximately 2 mm in transverse diameter at its most narrow point (series 10, image 12). Suspected cord signal abnormality at this level, although poorly visualized due to susceptibility artifact from adjacent hardware. Postoperative fluid collection again noted at the adjacent laminectomy site, likely slightly decreased in size. No other metastatic lesions identified. Reactive marrow edema and enhancement about the right T12-L1 facet favored to be degenerative in nature (series 6, image 4). Vertebral body heights otherwise maintained. Mild discogenic reactive endplate changes present at the thoracolumbar junction. Cord:  No other cord signal abnormality identified. Paraspinal and other soft tissues: No other acute paraspinous soft tissue abnormality. Probable mild cholelithiasis noted. Visualized visceral  structures otherwise unremarkable. Disc levels: T7-8: Central disc extrusion flattens the ventral thoracic spinal cord and resultant moderate spinal stenosis, stable. Left paracentral disc protrusion at T8-9 with mild flattening of the left hemi cord also unchanged. MRI LUMBAR SPINE FINDINGS Segmentation: Normal segmentation. Lowest well-formed disc labeled the L5-S1 level. Alignment: Levoscoliosis. Vertebral bodies otherwise normally aligned with  preservation of the normal lumbar lordosis. No listhesis. Vertebrae: Mild height loss with reactive endplate edema at the left aspect of the superior endplate of L4, suspicious for mild compression fracture (series 14, image 12). No bony retropulsion. This is benign in appearance, with no underlying lesion identified. No worrisome osseous lesions or evidence for metastatic disease identified within the lumbar spine. Prominent reactive marrow edema about the left L3-4 facet due to advanced facet degeneration. Conus medullaris: Extends to the L1 level and appears normal. Paraspinal and other soft tissues: Paraspinous soft tissues within normal limits. Visualized visceral structures unremarkable. Disc levels: L1-2:  Mild facet and ligament flavum hypertrophy.  No stenosis. L2-3: Minimal disc bulge with bilateral facet hypertrophy. No significant stenosis. L3-4: Disc desiccation with minimal disc bulge. Prominent anterior bridging osteophyte. Advanced bilateral facet degeneration with associated reactive marrow edema, left greater than right. 7 mm synovial cyst noted at the posterior aspect of the right L3-4 facet. Mild canal with bilateral L3 foraminal stenosis. L4-5: Chronic intervertebral disc mild diffuse disc bulge. Moderate to advanced facet arthrosis, left greater than right. Resultant moderate canal with bilateral subarticular stenosis. Foramina remain patent. L5-S1: Chronic intervertebral disc space narrowing with diffuse disc bulge. Left greater than right facet  degeneration. Resultant moderate to severe left lateral recess narrowing. Mild right with mild to moderate left L5 foraminal stenosis. IMPRESSION: MRI THORACIC SPINE IMPRESSION 1. 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 Neuro surgical consultation recommended. 2. Superimposed sequelae of prior T1 through T5 fusion for T3 decompression. 3. Stable disc protrusions at T7-8 and T8-9. 4. Prominent facet arthropathy at T12-L1 on the right with associated reactive marrow edema. MRI LUMBAR SPINE IMPRESSION 1. No evidence for metastatic disease within the lumbar spine. 2. Mild acute to subacute endplate compression deformity involving the left aspect of the superior endplate of L4. This is benign in appearance with no underlying pathologic lesion. 3. Advanced bilateral facet degeneration at L3-4 with associated reactive marrow edema, left greater than right. 4. Multilevel degenerative spondylolysis as above, most notable at L4-5 and L5-S1. Resultant moderate canal stenosis at L4-5, with moderate to severe left lateral recess narrowing at L5-S1. Critical Value/emergent results were called by telephone at the time of interpretation on 09/02/2017 at 8:30 pm to the ER PA Casa , who verbally acknowledged these results. Electronically Signed   By: Jeannine Boga M.D.   On: 09/02/2017 20:31   Dg C-arm 1-60 Min  Result Date: 09/11/2017 CLINICAL DATA:  Intramedullary femoral nailing. EXAM: DG C-ARM 61-120 MIN; LEFT FEMUR 2 VIEWS FLUOROSCOPY TIME:  1 minutes, 41 seconds. C-arm fluoroscopic images were obtained intraoperatively and submitted for post operative interpretation. COMPARISON:  Left femur x-rays from yesterday. FINDINGS: Multiple intraoperative x-rays demonstrate interval retrograde intramedullary rod placement of the femur with proximal and distal interlocking screws. No hardware complication. Improved alignment  of the oblique distal femoral fracture with persistent slight medial displacement. IMPRESSION: Left distal femur fracture fixation with intramedullary rod. Electronically Signed   By: Titus Dubin M.D.   On: 09/11/2017 12:55   Dg C-arm 1-60 Min  Result Date: 09/11/2017 CLINICAL DATA:  Intramedullary femoral nailing. EXAM: DG C-ARM 61-120 MIN; LEFT FEMUR 2 VIEWS FLUOROSCOPY TIME:  1 minutes, 41 seconds. C-arm fluoroscopic images were obtained intraoperatively and submitted for post operative interpretation. COMPARISON:  Left femur x-rays from yesterday. FINDINGS: Multiple intraoperative x-rays demonstrate interval retrograde intramedullary rod placement of the femur  with proximal and distal interlocking screws. No hardware complication. Improved alignment of the oblique distal femoral fracture with persistent slight medial displacement. IMPRESSION: Left distal femur fracture fixation with intramedullary rod. Electronically Signed   By: Titus Dubin M.D.   On: 09/11/2017 12:55   Dg Femur Min 2 Views Left  Result Date: 09/11/2017 CLINICAL DATA:  Intramedullary femoral nailing. EXAM: DG C-ARM 61-120 MIN; LEFT FEMUR 2 VIEWS FLUOROSCOPY TIME:  1 minutes, 41 seconds. C-arm fluoroscopic images were obtained intraoperatively and submitted for post operative interpretation. COMPARISON:  Left femur x-rays from yesterday. FINDINGS: Multiple intraoperative x-rays demonstrate interval retrograde intramedullary rod placement of the femur with proximal and distal interlocking screws. No hardware complication. Improved alignment of the oblique distal femoral fracture with persistent slight medial displacement. IMPRESSION: Left distal femur fracture fixation with intramedullary rod. Electronically Signed   By: Titus Dubin M.D.   On: 09/11/2017 12:55   Dg Femur Port Min 2 Views Left  Result Date: 09/11/2017 CLINICAL DATA:  Fracture. INTRAMEDULLARY (IM) RETROGRADE FEMORAL NAILING PERFORMED EARLIER TODAY. EXAM:  LEFT FEMUR PORTABLE 2 VIEWS COMPARISON:  CT of the pelvis on 09/09/2017 FINDINGS: Status post placement of intramedullary nail across oblique distal femur fracture. Cortical screws transfix the proximal and distal aspects. There is deformity of the LEFT inferior pubic ramus consistent with old injury. Degenerative changes are seen at the acetabulum. IMPRESSION: IM nail of the LEFT femur. Electronically Signed   By: Nolon Nations M.D.   On: 09/11/2017 16:45   Dg Femur Port Min 2 Views Left  Result Date: 09/11/2017 CLINICAL DATA:  Status post ORIF of left femoral fracture EXAM: LEFT FEMUR PORTABLE 2 VIEWS COMPARISON:  09/10/2017 FINDINGS: Medullary rod is now noted in place. The distal femoral fracture is again identified in near anatomic alignment. No other focal abnormality is seen. IMPRESSION: Distal femoral fracture with medullary rod in place. Electronically Signed   By: Inez Catalina M.D.   On: 09/11/2017 14:54   Dg Femur Port Min 2 Views Left  Result Date: 09/10/2017 CLINICAL DATA:  52 y/o  F; left femur fracture. EXAM: LEFT FEMUR PORTABLE 2 VIEWS COMPARISON:  09/10/2017 left knee radiographs FINDINGS: Acute oblique extra-articular fracture of the distal left femur metadiaphysis with 1/2 shaft's width lateral displacement of the shaft relative to distal component and mild dorsal apex angulation. Knee joint is maintained. No knee joint effusion. Bones are diffusely demineralized. IMPRESSION: Acute oblique extra-articular fracture of the distal left femur metadiaphysis with 1/2 shaft's width lateral displacement of the shaft relative to distal component and mild dorsal apex angulation. Electronically Signed   By: Kristine Garbe M.D.   On: 09/10/2017 22:37   US Abdomen Limited Ruq  Result Date: 09/16/2017 CLINICAL DATA:  Nausea vomiting for 1 week, history of cholelithiasis EXAM: ULTRASOUND ABDOMEN LIMITED RIGHT UPPER QUADRANT COMPARISON:  08/10/2017 FINDINGS: Gallbladder: Gallbladder is  well distended with evidence of cholelithiasis. No gallbladder wall thickening or pericholecystic fluid is noted. Common bile duct: Diameter: 5.5 mm. Liver: Increased coarsened echotexture consistent with fatty infiltration. Portal vein is patent on color Doppler imaging with normal direction of blood flow towards the liver. IMPRESSION: Cholelithiasis without complicating factors. Changes consistent with fatty infiltration of the liver. Electronically Signed   By: Inez Catalina M.D.   On: 09/16/2017 16:33     CBC Recent Labs  Lab 09/12/17 0351 09/14/17 0848 09/15/17 2328 09/16/17 0742 09/17/17 0539  WBC 11.4* 8.6 11.4* 10.5 8.7  HGB 7.6* 8.0* 8.4* 8.1* 7.9*  HCT  24.0* 25.5* 26.2* 25.7* 25.1*  PLT 143* 166 176 212 194  MCV 96.0 98.1 95.6 96.3 96.2  MCH 30.4 30.8 30.7 30.3 30.3  MCHC 31.7 31.4 32.1 31.5 31.5  RDW 16.6* 16.8* 17.1* 17.3* 17.5*  LYMPHSABS  --   --  0.8  --   --   MONOABS  --   --  0.7  --   --   EOSABS  --   --  0.0  --   --   BASOSABS  --   --  0.0  --   --     Chemistries  Recent Labs  Lab 09/13/17 0422 09/14/17 0407 09/15/17 3704 09/15/17 2328 09/16/17 0617 09/16/17 1010 09/17/17 0539  NA 137 136  --  135 135  --  134*  K 4.7 4.9  --  5.1 4.9  --  5.3*  CL 105 106  --  102 102  --  101  CO2 27 27  --  23 25  --  27  GLUCOSE 178* 189*  --  159* 145*  --  118*  BUN 24* 27*  --  34* 31*  --  26*  CREATININE 0.98 1.02*  --  1.08* 0.96  --  1.08*  CALCIUM 7.0* 7.2* 7.2* 7.7* 7.7*  --  7.6*  MG  --   --   --   --   --  2.2  --   AST  --   --   --  20  --   --  20  ALT  --   --   --  17  --   --  20  ALKPHOS  --   --   --  151*  --   --  137*  BILITOT  --   --   --  1.4*  --   --  1.6*   ------------------------------------------------------------------------------------------------------------------ estimated creatinine clearance is 83.3 mL/min (A) (by C-G formula based on SCr of 1.08 mg/dL  (H)). ------------------------------------------------------------------------------------------------------------------ No results for input(s): HGBA1C in the last 72 hours. ------------------------------------------------------------------------------------------------------------------ No results for input(s): CHOL, HDL, LDLCALC, TRIG, CHOLHDL, LDLDIRECT in the last 72 hours. ------------------------------------------------------------------------------------------------------------------ Recent Labs    09/15/17 0635  TSH 0.036*   ------------------------------------------------------------------------------------------------------------------ No results for input(s): VITAMINB12, FOLATE, FERRITIN, TIBC, IRON, RETICCTPCT in the last 72 hours.  Coagulation profile Recent Labs  Lab 09/15/17 2328  INR 1.17    No results for input(s): DDIMER in the last 72 hours.  Cardiac Enzymes Recent Labs  Lab 09/15/17 2328  TROPONINI <0.03   ------------------------------------------------------------------------------------------------------------------ Invalid input(s): POCBNP   CBG: Recent Labs  Lab 09/16/17 1642 09/16/17 2136 09/17/17 0049 09/17/17 0506 09/17/17 0738  GLUCAP 128* 122* 110* 107* 115*       Studies: Dg Abd 1 View  Result Date: 09/15/2017 CLINICAL DATA:  Abdominal pain, nausea and vomiting EXAM: ABDOMEN - 1 VIEW COMPARISON:  08/09/2017 CT abdomen/pelvis FINDINGS: No dilated small bowel loops. Moderate colorectal stool volume. No evidence of pneumatosis or pneumoperitoneum. No radiopaque nephrolithiasis. Mild levocurvature of the lumbar spine. Marked lumbar spine degenerative changes. Partially visualized surgical fixation hardware in the proximal left femur. IMPRESSION: Nonobstructive bowel gas pattern. Moderate colorectal stool volume, which may indicate constipation. Electronically Signed   By: Ilona Sorrel M.D.   On: 09/15/2017 22:33   US Abdomen Limited  Ruq  Result Date: 09/16/2017 CLINICAL DATA:  Nausea vomiting for 1 week, history of cholelithiasis EXAM: ULTRASOUND ABDOMEN LIMITED RIGHT UPPER QUADRANT COMPARISON:  08/10/2017 FINDINGS: Gallbladder:  Gallbladder is well distended with evidence of cholelithiasis. No gallbladder wall thickening or pericholecystic fluid is noted. Common bile duct: Diameter: 5.5 mm. Liver: Increased coarsened echotexture consistent with fatty infiltration. Portal vein is patent on color Doppler imaging with normal direction of blood flow towards the liver. IMPRESSION: Cholelithiasis without complicating factors. Changes consistent with fatty infiltration of the liver. Electronically Signed   By: Inez Catalina M.D.   On: 09/16/2017 16:33      Lab Results  Component Value Date   HGBA1C 5.5 08/09/2017   HGBA1C 5.9 08/05/2016   HGBA1C 5.7 (H) 07/06/2015   Lab Results  Component Value Date   LDLCALC 98 06/07/2015   CREATININE 1.08 (H) 09/17/2017       Scheduled Meds: . dexamethasone  4 mg Intravenous Q6H  . docusate  100 mg Oral BID  . insulin aspart  0-9 Units Subcutaneous Q4H  . levothyroxine  68.5 mcg Intravenous Daily  . polyethylene glycol  17 g Oral BID   Continuous Infusions: . sodium chloride    . famotidine (PEPCID) IV Stopped (09/16/17 2245)  . heparin 900 Units/hr (09/17/17 0533)     LOS: 1 day    Time spent: >30 MINS    Reyne Dumas  [facility] Pager [phone]. If 7PM-7AM, please contact night-coverage at [email or web address], password [facility] 09/17/2017, 8:18 AM  LOS: 1 day

## 2017-09-17 NOTE — Progress Notes (Signed)
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 Bee Venom Anaphylaxis  . Keflex [Cephalexin] Nausea And Vomiting  . Tramadol Nausea And Vomiting  . Gabapentin Nausea And Vomiting  . Heparin Other (See Comments)    Weak positive platelets induced antibodies, SRA negative--2015  . Hydrochlorothiazide Nausea And Vomiting  . Hydrocodone Nausea And Vomiting  . Oxycodone-Acetaminophen Nausea And Vomiting    Patient Measurements: Height: 5\' 11"  (180.3 cm) Weight: 237 lb 14 oz (107.9 kg) IBW/kg (Calculated) : 70.8 Heparin Dosing Weight:   Vital Signs: Temp: 98.9 F (37.2 C) (06/27 1400) BP: 129/78 (06/27 1400) Pulse Rate: 78 (06/27 1400)  Labs: Recent Labs    09/15/17 2328 09/16/17 0617 09/16/17 0742 09/16/17 1010 09/16/17 1923 09/17/17 0539 09/17/17 1252  HGB 8.4*  --  8.1*  --   --  7.9*  --   HCT 26.2*  --  25.7*  --   --  25.1*  --   PLT 176  --  212  --   --  194  --   APTT 24  --   --  136* 166* 75* 83*  LABPROT 14.8  --   --   --   --   --   --   INR 1.17  --   --   --   --   --   --   HEPARINUNFRC  --   --   --  1.34*  --  0.72*  --   CREATININE 1.08* 0.96  --   --   --  1.08*  --   TROPONINI <0.03  --   --   --   --   --   --     Estimated Creatinine Clearance: 83.3 mL/min (A) (by C-G formula based on SCr of 1.08 mg/dL (H)).  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 Cannot swallow and transitioned to IV heparin.  APTT 136>166>75 this AM, HL 0.72 correlating better., Hgb 7.9. Plts 194 ok. Confirmed APTT 84 remains in goal range.  Goal of Therapy:  aPTT 66-102 seconds Monitor platelets by anticoagulation protocol: Yes   Plan:  - Continue IV heparin at 900 unit/hr - Daily heparin level, aPTT, CBC - Monitor for s/sx of bleeding  Johnney Scarlata S. Alford Highland, PharmD, BCPS Clinical Staff Pharmacist Pager [phone]  Eilene Ghazi Stillinger 09/17/2017,2:09  PM

## 2017-09-17 NOTE — IPOC Note (Signed)
No IPOC needed

## 2017-09-18 DIAGNOSIS — G43A1 Cyclical vomiting, intractable: Secondary | ICD-10-CM

## 2017-09-18 LAB — GLUCOSE, CAPILLARY
GLUCOSE-CAPILLARY: 122 mg/dL — AB (ref 70–99)
GLUCOSE-CAPILLARY: 112 mg/dL — AB (ref 70–99)
GLUCOSE-CAPILLARY: 152 mg/dL — AB (ref 70–99)
GLUCOSE-CAPILLARY: 160 mg/dL — AB (ref 70–99)
GLUCOSE-CAPILLARY: 148 mg/dL — AB (ref 70–99)

## 2017-09-18 LAB — CBC
HCT: 22.3 % — ABNORMAL LOW (ref 36.0–46.0)
Hemoglobin: 7 g/dL — ABNORMAL LOW (ref 12.0–15.0)
MCH: 30.7 pg (ref 26.0–34.0)
MCHC: 31.4 g/dL (ref 30.0–36.0)
MCV: 97.8 fL (ref 78.0–100.0)
Platelets: 161 10*3/uL (ref 150–400)
RBC: 2.28 MIL/uL — AB (ref 3.87–5.11)
RDW: 17.2 % — ABNORMAL HIGH (ref 11.5–15.5)
WBC: 5.2 10*3/uL (ref 4.0–10.5)

## 2017-09-18 LAB — COMPREHENSIVE METABOLIC PANEL
ALT: 15 U/L (ref 0–44)
AST: 18 U/L (ref 15–41)
Albumin: 2.5 g/dL — ABNORMAL LOW (ref 3.5–5.0)
Alkaline Phosphatase: 132 U/L — ABNORMAL HIGH (ref 38–126)
Anion gap: 5 (ref 5–15)
BILIRUBIN TOTAL: 1.7 mg/dL — AB (ref 0.3–1.2)
BUN: 23 mg/dL — ABNORMAL HIGH (ref 6–20)
CALCIUM: 7.6 mg/dL — AB (ref 8.9–10.3)
CHLORIDE: 104 mmol/L (ref 98–111)
CO2: 26 mmol/L (ref 22–32)
CREATININE: 0.94 mg/dL (ref 0.44–1.00)
Glucose, Bld: 125 mg/dL — ABNORMAL HIGH (ref 70–99)
Potassium: 5.2 mmol/L — ABNORMAL HIGH (ref 3.5–5.1)
Sodium: 135 mmol/L (ref 135–145)
TOTAL PROTEIN: 4.4 g/dL — AB (ref 6.5–8.1)

## 2017-09-18 LAB — PREPARE RBC (CROSSMATCH)

## 2017-09-18 MED ORDER — FLUTICASONE PROPIONATE 50 MCG/ACT NA SUSP
1.0000 | Freq: Two times a day (BID) | NASAL | Status: DC
  Administered 2017-09-18 – 2017-09-21 (×5): 1 via NASAL
  Filled 2017-09-18: qty 16

## 2017-09-18 MED ORDER — FUROSEMIDE 10 MG/ML IJ SOLN
20.0000 mg | Freq: Once | INTRAMUSCULAR | Status: AC
  Administered 2017-09-18: 20 mg via INTRAVENOUS
  Filled 2017-09-18: qty 2

## 2017-09-18 MED ORDER — PHENOL 1.4 % MT LIQD
1.0000 | OROMUCOSAL | Status: DC | PRN
  Administered 2017-09-18: 1 via OROMUCOSAL
  Filled 2017-09-18: qty 177

## 2017-09-18 MED ORDER — SODIUM POLYSTYRENE SULFONATE 15 GM/60ML PO SUSP
30.0000 g | Freq: Once | ORAL | Status: AC
  Administered 2017-09-18: 30 g via ORAL
  Filled 2017-09-18: qty 120

## 2017-09-18 MED ORDER — SENNOSIDES-DOCUSATE SODIUM 8.6-50 MG PO TABS
2.0000 | ORAL_TABLET | Freq: Every day | ORAL | Status: DC
  Administered 2017-09-18: 2 via ORAL
  Filled 2017-09-18: qty 2

## 2017-09-18 MED ORDER — SODIUM CHLORIDE 0.9% IV SOLUTION
Freq: Once | INTRAVENOUS | Status: AC
  Administered 2017-09-18: 12:00:00 via INTRAVENOUS

## 2017-09-18 NOTE — Progress Notes (Signed)
OT Cancellation Note  Patient Details Name: Sarah [name] MRN: [id number] DOB: 10/28/1965   Cancelled Treatment:    Reason Eval/Treat Not Completed: Patient at procedure or test/ unavailable. Pt receiving blood at this time Will attempt to check back after lunch.  Ramond Dial, OT/L  OT Clinical Specialist [phone]  09/18/2017, 12:34 PM

## 2017-09-18 NOTE — Progress Notes (Signed)
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 Constipation improved, patient also had right upper quadrant pain and had a negative gallbladder ultrasound.    Subjective--- no chest pains, eating and drinking well, had a bowel movement   Assessment and plan  1)Intractable nausea vomiting-clinically and radiologically patient was constipated, improving with laxatives, x-ray of the abdomen shows moderate stool burden okay to advance diet, no further emesis  2)Metastatic thyroid cancer to the spine status post recent resection on Decadron which  is dosed IV until patient can take orally reliably.  3)H/o PE- c/n Xarelto, please note that HIT was not confirmed, patient has tolerated heparin in the past  4) postoperative hypothyroidism--continue thyroid replacement  5)Lt Femur Fx- s/p ORIF, awaiting transfer to rehab  6) steroid-induced hyperglycemia-- Use Novolog/Humalog Sliding scale insulin with Accu-Cheks/Fingersticks as ordered   7) chronic anemia-multifactorial, transfuse as clinically indicated no evidence of ongoing bleeding at this time  8)HTN-  may use IV Hydralazine 10 mg  Every 4 hours Prn for systolic blood pressure over 160 mmhg  9) hypocalcemia--replace calcium as  ordered  10)HyperKalemia---Kayexalate ordered   DVT prophylaxsis - Xarelto   Code Status:  Full code   Family Communication: Discussed in detail with the patient, all imaging results, lab results explained to the patient   Disposition Plan:    CIR (awaiting insurance approval for rehab)  Antibiotics: Anti-infectives (From admission, onward)   None        Objective: Vitals:   09/18/17 1205 09/18/17 1220 09/18/17 1235 09/18/17 1412  BP:  131/85 124/74 130/87  Pulse:    86  Resp:  18 17 18   Temp: 98.4 F (36.9 C) 98.4 F (36.9 C) 98.3 F (36.8 C)   TempSrc: Oral Oral Oral   SpO2:  99% 99% 100%  Weight:      Height:        Intake/Output Summary (Last 24 hours) at 09/18/2017 1920 Last data filed at 09/18/2017 1415 Gross per 24 hour  Intake 1682.42 ml  Output 3200 ml  Net -1517.58 ml    Exam:  Examination:  General exam: Appears calm and comfortable  Respiratory system: Clear to auscultation. Respiratory effort normal. Cardiovascular system: S1 & S2 heard, RRR. No JVD, murmurs, rubs, gallops or clicks. No pedal edema. Gastrointestinal system: Abdomen is nondistended, soft and nontender. No organomegaly or masses felt. Normal bowel sounds heard. Central nervous system: Alert and oriented. No focal neurological deficits. Extremities: Left foot bruising  skin: No rashes, lesions or ulcers Psychiatry: Judgement and insight appear normal. Mood & affect appropriate.      Micro Results No results found for this or any previous visit (from the past 240 hour(s)).  Radiology Reports Dg Shoulder Right  Result Date: 09/02/2017 CLINICAL DATA:  Pain following fall EXAM: RIGHT SHOULDER - 2+ VIEW COMPARISON:  None. FINDINGS: Frontal and Y scapular images were obtained. There is no appreciable fracture or dislocation. There is moderate generalized osteoarthritic change. No erosive change or intra-articular calcification. Visualized right lung is clear. Postoperative changes  present in the upper thoracic region. 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 No fracture or dislocation. Electronically Signed   By: Lowella Grip III M.D.   On: 09/02/2017 14:04   Dg Knee 1-2 Views Left  Result Date: 09/10/2017 CLINICAL DATA:  Pain LEFT knee, recent fall on 09/02/2017 EXAM: LEFT KNEE - 1-2 VIEW COMPARISON:  None FINDINGS: Osseous demineralization. Joint spaces preserved. Oblique fracture of the distal LEFT femoral metadiaphysis mildly displaced medially and posteriorly. No additional fracture, dislocation or bone destruction. No knee joint effusion. IMPRESSION: Displaced oblique distal LEFT femoral metadiaphyseal fracture. Findings called to [place] on [facility] on 09/10/2017 at 1317 hours. Electronically Signed   By: Lavonia Dana M.D.   On: 09/10/2017 13:17   Dg Abd 1 View  Result Date: 09/15/2017 CLINICAL DATA:  Abdominal pain, nausea and vomiting EXAM: ABDOMEN - 1 VIEW COMPARISON:  08/09/2017 CT abdomen/pelvis FINDINGS: No dilated small bowel loops. Moderate colorectal stool volume. No evidence of pneumatosis or pneumoperitoneum. No radiopaque nephrolithiasis. Mild levocurvature of the lumbar spine. Marked lumbar spine degenerative changes. Partially visualized surgical fixation hardware in the proximal left femur. IMPRESSION: Nonobstructive bowel gas pattern. Moderate colorectal stool volume, which may indicate constipation. Electronically Signed   By: Ilona Sorrel M.D.   On: 09/15/2017 22:33   Ct Head Wo Contrast  Result Date: 09/02/2017 CLINICAL DATA:  Status post fall moving from a bed to a wheelchair today. Initial encounter. EXAM: CT HEAD WITHOUT CONTRAST CT CERVICAL SPINE WITHOUT CONTRAST TECHNIQUE: Multidetector CT imaging of the head and cervical spine was performed following the standard protocol without intravenous contrast. Multiplanar CT image reconstructions of the cervical spine were also generated. COMPARISON:  Head CT scan  12/22/2016.  PET CT scan 05/20/2017. FINDINGS: CT HEAD FINDINGS Brain: No evidence of acute infarction, hemorrhage, hydrocephalus, extra-axial collection or mass lesion/mass effect. Vascular: A few atherosclerotic calcifications are seen. Skull: Intact.  No focal lesion. Sinuses/Orbits: Mild mucosal thickening is noted in the maxillary sinuses, frontal sinuses, sphenoid sinus and scattered ethmoid air cells. Other: None. CT CERVICAL SPINE FINDINGS Alignment: Maintained. Skull base and vertebrae: No acute fracture. No primary bone lesion or focal pathologic process. Soft tissues and spinal canal: No prevertebral fluid or swelling. No visible canal hematoma. Disc levels: Intervertebral disc space height is maintained. There is some endplate spurring most notable at C3-4 and C5-6. Partial visualization of thoracic fusion hardware noted. Upper chest: 0.5 cm left upper lobe pulmonary nodule is seen on prior PET CT scan is noted. Other: None. IMPRESSION: No acute abnormality head or cervical spine. Atherosclerosis. Sinus disease. Mild appearing cervical spondylosis. No change in 0.5 cm left upper lobe pulmonary nodule consistent with the patient's known metastatic thyroid carcinoma. Electronically Signed   By: Inge Rise M.D.   On: 09/02/2017 14:37   Ct Cervical Spine Wo Contrast  Result Date: 09/02/2017 CLINICAL DATA:  Status post fall moving from a bed to a wheelchair today. Initial encounter. EXAM: CT HEAD WITHOUT CONTRAST CT CERVICAL SPINE WITHOUT CONTRAST TECHNIQUE: Multidetector CT imaging of the head and cervical spine was performed following the standard protocol without intravenous contrast. Multiplanar CT image reconstructions of the cervical spine were also generated. COMPARISON:  Head CT scan 12/22/2016.  PET CT scan 05/20/2017. FINDINGS: CT HEAD FINDINGS  Brain: No evidence of acute infarction, hemorrhage, hydrocephalus, extra-axial collection or mass lesion/mass effect. Vascular: A few  atherosclerotic calcifications are seen. Skull: Intact.  No focal lesion. Sinuses/Orbits: Mild mucosal thickening is noted in the maxillary sinuses, frontal sinuses, sphenoid sinus and scattered ethmoid air cells. Other: None. CT CERVICAL SPINE FINDINGS Alignment: Maintained. Skull base and vertebrae: No acute fracture. No primary bone lesion or focal pathologic process. Soft tissues and spinal canal: No prevertebral fluid or swelling. No visible canal hematoma. Disc levels: Intervertebral disc space height is maintained. There is some endplate spurring most notable at C3-4 and C5-6. Partial visualization of thoracic fusion hardware noted. Upper chest: 0.5 cm left upper lobe pulmonary nodule is seen on prior PET CT scan is noted. Other: None. IMPRESSION: No acute abnormality head or cervical spine. Atherosclerosis. Sinus disease. Mild appearing cervical spondylosis. No change in 0.5 cm left upper lobe pulmonary nodule consistent with the patient's known metastatic thyroid carcinoma. Electronically Signed   By: Inge Rise M.D.   On: 09/02/2017 14:37   Ct Thoracic Spine Wo Contrast  Result Date: 09/09/2017 CLINICAL DATA:  Neck hematoma, traumatic. Bone neoplasm. Check for local recurrence EXAM: CT THORACIC SPINE WITHOUT CONTRAST TECHNIQUE: Multidetector CT images of the thoracic were obtained using the standard protocol without intravenous contrast. COMPARISON:  Thoracic MRI 09/02/2017.  Thoracic spine CT 09/03/2017 FINDINGS: Alignment: Stable alignment with mild exaggerated thoracic kyphosis. Vertebrae: There is known malignant erosion of the T3, T2, and T4 bodies with chronic T3 collapse. Status post remote laminectomy with more recent reopening for recurrent tumor excision. There is posterior hardware spanning T1-T5, which is well seated. No progressive erosion or interval fracture seen. Paraspinal and other soft tissues: High-density material within the incision, both subcutaneous and deep, measuring up  to 16 mm thickness, 8 cm craniocaudal, and 7 cm in depth. The fluid is high-density compatible with blood products. At the level of the laminectomy, mass effect on the thecal sac would not be detectable by this technique. Degree of residual tumor is also not well established by this technique. Disc levels: Diffuse spondylosis with bridging osteophytes from T4 to T12. There are small calcified disc protrusions at T7-8 and T8-9. IMPRESSION: Interval T3 spinal canal tumor debulking with hemolymphatic material in the subcutaneous and deep incision measuring 40-50 cc. No detected change in osseous structures or posterior hardware. Electronically Signed   By: Monte Fantasia M.D.   On: 09/09/2017 12:51   Ct Thoracic Spine Wo Contrast  Result Date: 09/03/2017 CLINICAL DATA:  Metastatic Hurthle cell carcinoma to the thoracic spine. T3 metastasis with prior decompression. Worsening disease with cord compression on recent MRI. EXAM: CT THORACIC SPINE WITHOUT CONTRAST TECHNIQUE: Multidetector CT images of the thoracic were obtained using the standard protocol without intravenous contrast. COMPARISON:  Thoracic spine MRI 09/02/2017 FINDINGS: Alignment: Mildly exaggerated upper thoracic kyphosis. No listhesis. Vertebrae: Prior posterior fusion from T1-T5. Bilateral pedicle screws at each level except for T3. The screws appear intact without evidence of loosening about the T1, T2, or T5 screws. There is lucency throughout the posterosuperior aspect of the T4 vertebral body including about the distal portions of both T4 screws reflecting tumor as seen on MRI. There is a chronic, severe to pathologic T3 compression fracture. Extensive epidural tumor centered at the T3 level was more fully evaluated on yesterday's MRI. Tumor is again noted to involve the posterior and inferior aspects of the T2 vertebral body. There is slight chronic anterior wedging of the T8 vertebral body. No  definite acute fracture is identified. Flowing  anterior vertebral osteophytes are present throughout the thoracic spine extending into the lumbar spine. Paraspinal and other soft tissues: Unremarkable. Disc levels: Spondylosis more fully evaluated on yesterday's MRI, with a small calcified disc extrusion noted at T7-8. IMPRESSION: 1. Metastatic disease as described on yesterday's MRI with extensive osseous and epidural tumor from T2-T4. Chronic pathologic T3 compression fracture. 2. Prior T1-T5 posterior fusion. Tumor involves the T4 vertebral body about the distal aspects of both pedicle screws. Electronically Signed   By: Logan Bores M.D.   On: 09/03/2017 15:13   Ct Lumbar Spine Wo Contrast  Result Date: 09/09/2017 CLINICAL DATA:  Hematoma, neck.  Drop in hematocrit. EXAM: CT LUMBAR SPINE WITHOUT CONTRAST TECHNIQUE: Multidetector CT imaging of the lumbar spine was performed without intravenous contrast administration. Multiplanar CT image reconstructions were also generated. COMPARISON:  09/02/2017 MRI FINDINGS: Segmentation: 5 lumbar type vertebral bodies. Alignment: Mild levoscoliosis. Vertebrae: No evidence of fracture, discitis, or aggressive bone lesion. Paraspinal and other soft tissues: Negative for hematoma. Disc levels: Congenitally narrow spinal canal with bulky spondylitic spurring and generalized degenerative facet hypertrophy throughout the lumbar levels. The spinal canal and foramina are better assessed by recent MRI. IMPRESSION: 1. Negative for hematoma. 2. Bulky spondylosis and facet arthropathy throughout the lumbar spine, with spinal stenosis exacerbated by short pedicles. See MRI from 1 week prior. Electronically Signed   By: Monte Fantasia M.D.   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 EXAM: CT PELVIS WITHOUT CONTRAST TECHNIQUE: Multidetector CT imaging of the pelvis was performed following the standard protocol  without intravenous contrast. COMPARISON:  Lumbar spine CT today reported separately. CT Abdomen and Pelvis 08/09/2017. FINDINGS: Urinary Tract: Foley catheter in place. The urinary bladder is decompressed. The visible ureters are decompressed and unremarkable. Bowel: Retained stool in the rectosigmoid colon similar to the CT last month. Visible large and small bowel loops are nondilated. Vascular/Lymphatic: Vascular patency is not evaluated in the absence of IV contrast. No lymphadenopathy. Reproductive:  Negative. Other: No pelvic free fluid. There is increased nonspecific presacral stranding. See series 4, image 32. Since May there is new widespread nonspecific subcutaneous fat stranding throughout the lateral abdominal wall and proximal lower extremities bilaterally, but greater on the left at the level of the proximal thigh as seen on series 4, image 60. no soft tissue fluid collection in these regions. Musculoskeletal: Bulky degenerative osteophytosis in the visible lower lumbar spine and about the hips, greater on the right. Bone mineralization remains within normal limits. There is no acute, destructive, or suspicious osseous lesion identified. IMPRESSION: 1. Unchanged pelvic osseous structures since May with no acute or suspicious osseous lesion identified. 2. Nonspecific bilateral lateral abdominal wall, presacral, flank, and proximal thigh subcutaneous edema/stranding. The left proximal thigh is asymmetrically more affected as seen on series 4, image 60. Query cellulitis or anasarca. Electronically Signed   By: Genevie Ann M.D.   On: 09/09/2017 21:17   Mr Lumbar Spine Wo Contrast  Result Date: 09/02/2017 CLINICAL DATA:  Initial evaluation for increased lower extremity weakness with history of metastatic Hurthle cell carcinoma to the thoracic spine. T3 metastasis with history of prior decompression. EXAM: MRI THORACIC AND LUMBAR SPINE WITHOUT AND WITH CONTRAST TECHNIQUE: Multiplanar and multiecho pulse  sequences of the thoracic and lumbar spine were obtained without and with intravenous contrast. CONTRAST:  56mL MULTIHANCE GADOBENATE DIMEGLUMINE  529 MG/ML IV SOLN COMPARISON:  Previous MRI from 12/25/2015. FINDINGS: MRI THORACIC SPINE FINDINGS Alignment: Examination mildly limited as the patient was unable to tolerate the full length of the exam. Postcontrast axial sequences as well as post-contrast imaging through the lumbar spine was not performed. Stable alignment with preservation of the normal thoracic kyphosis. No listhesis. Vertebrae: Postoperative changes from prior fusion from T1 through T5 for T3 decompression again seen. Pathologic fracture deformity of T3 is relatively unchanged in appearance. Interval growth with progressive tumor seen within the adjacent spinal canal, extending from the mid aspect of T2 through the mid aspect of T4 (series 7, image 8). There has been marked interval worsening of cord and thecal sac progression, with the sac compressed laterally and slit like in appearance, measuring approximately 2 mm in transverse diameter at its most narrow point (series 10, image 12). Suspected cord signal abnormality at this level, although poorly visualized due to susceptibility artifact from adjacent hardware. Postoperative fluid collection again noted at the adjacent laminectomy site, likely slightly decreased in size. No other metastatic lesions identified. Reactive marrow edema and enhancement about the right T12-L1 facet favored to be degenerative in nature (series 6, image 4). Vertebral body heights otherwise maintained. Mild discogenic reactive endplate changes present at the thoracolumbar junction. Cord:  No other cord signal abnormality identified. Paraspinal and other soft tissues: No other acute paraspinous soft tissue abnormality. Probable mild cholelithiasis noted. Visualized visceral structures otherwise unremarkable. Disc levels: T7-8: Central disc extrusion flattens the ventral  thoracic spinal cord and resultant moderate spinal stenosis, stable. Left paracentral disc protrusion at T8-9 with mild flattening of the left hemi cord also unchanged. MRI LUMBAR SPINE FINDINGS Segmentation: Normal segmentation. Lowest well-formed disc labeled the L5-S1 level. Alignment: Levoscoliosis. Vertebral bodies otherwise normally aligned with preservation of the normal lumbar lordosis. No listhesis. Vertebrae: Mild height loss with reactive endplate edema at the left aspect of the superior endplate of L4, suspicious for mild compression fracture (series 14, image 12). No bony retropulsion. This is benign in appearance, with no underlying lesion identified. No worrisome osseous lesions or evidence for metastatic disease identified within the lumbar spine. Prominent reactive marrow edema about the left L3-4 facet due to advanced facet degeneration. Conus medullaris: Extends to the L1 level and appears normal. Paraspinal and other soft tissues: Paraspinous soft tissues within normal limits. Visualized visceral structures unremarkable. Disc levels: L1-2:  Mild facet and ligament flavum hypertrophy.  No stenosis. L2-3: Minimal disc bulge with bilateral facet hypertrophy. No significant stenosis. L3-4: Disc desiccation with minimal disc bulge. Prominent anterior bridging osteophyte. Advanced bilateral facet degeneration with associated reactive marrow edema, left greater than right. 7 mm synovial cyst noted at the posterior aspect of the right L3-4 facet. Mild canal with bilateral L3 foraminal stenosis. L4-5: Chronic intervertebral disc mild diffuse disc bulge. Moderate to advanced facet arthrosis, left greater than right. Resultant moderate canal with bilateral subarticular stenosis. Foramina remain patent. L5-S1: Chronic intervertebral disc space narrowing with diffuse disc bulge. Left greater than right facet degeneration. Resultant moderate to severe left lateral recess narrowing. Mild right with mild to  moderate left L5 foraminal stenosis. IMPRESSION: MRI THORACIC SPINE IMPRESSION 1. 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 Neuro surgical consultation recommended. 2. Superimposed sequelae of prior T1 through T5 fusion for T3 decompression. 3. Stable disc protrusions at T7-8 and T8-9. 4. Prominent facet arthropathy at T12-L1 on the right with  associated reactive marrow edema. MRI LUMBAR SPINE IMPRESSION 1. No evidence for metastatic disease within the lumbar spine. 2. Mild acute to subacute endplate compression deformity involving the left aspect of the superior endplate of L4. This is benign in appearance with no underlying pathologic lesion. 3. Advanced bilateral facet degeneration at L3-4 with associated reactive marrow edema, left greater than right. 4. Multilevel degenerative spondylolysis as above, most notable at L4-5 and L5-S1. Resultant moderate canal stenosis at L4-5, with moderate to severe left lateral recess narrowing at L5-S1. Critical Value/emergent results were called by telephone at the time of interpretation on 09/02/2017 at 8:30 pm to the ER PA [place] , who verbally acknowledged these results. Electronically Signed   By: Jeannine Boga M.D.   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 EXAM: MRI THORACIC AND LUMBAR SPINE WITHOUT AND WITH CONTRAST TECHNIQUE: Multiplanar and multiecho pulse sequences of the thoracic and lumbar spine were obtained without and with intravenous contrast. CONTRAST:  64mL MULTIHANCE GADOBENATE DIMEGLUMINE 529 MG/ML IV SOLN COMPARISON:  Previous MRI from 12/25/2015. FINDINGS: MRI THORACIC SPINE FINDINGS Alignment: Examination mildly  limited as the patient was unable to tolerate the full length of the exam. Postcontrast axial sequences as well as post-contrast imaging through the lumbar spine was not performed. Stable alignment with preservation of the normal thoracic kyphosis. No listhesis. Vertebrae: Postoperative changes from prior fusion from T1 through T5 for T3 decompression again seen. Pathologic fracture deformity of T3 is relatively unchanged in appearance. Interval growth with progressive tumor seen within the adjacent spinal canal, extending from the mid aspect of T2 through the mid aspect of T4 (series 7, image 8). There has been marked interval worsening of cord and thecal sac progression, with the sac compressed laterally and slit like in appearance, measuring approximately 2 mm in transverse diameter at its most narrow point (series 10, image 12). Suspected cord signal abnormality at this level, although poorly visualized due to susceptibility artifact from adjacent hardware. Postoperative fluid collection again noted at the adjacent laminectomy site, likely slightly decreased in size. No other metastatic lesions identified. Reactive marrow edema and enhancement about the right T12-L1 facet favored to be degenerative in nature (series 6, image 4). Vertebral body heights otherwise maintained. Mild discogenic reactive endplate changes present at the thoracolumbar junction. Cord:  No other cord signal abnormality identified. Paraspinal and other soft tissues: No other acute paraspinous soft tissue abnormality. Probable mild cholelithiasis noted. Visualized visceral structures otherwise unremarkable. Disc levels: T7-8: Central disc extrusion flattens the ventral thoracic spinal cord and resultant moderate spinal stenosis, stable. Left paracentral disc protrusion at T8-9 with mild flattening of the left hemi cord also unchanged. MRI LUMBAR SPINE FINDINGS Segmentation: Normal segmentation. Lowest well-formed disc labeled the L5-S1  level. Alignment: Levoscoliosis. Vertebral bodies otherwise normally aligned with preservation of the normal lumbar lordosis. No listhesis. Vertebrae: Mild height loss with reactive endplate edema at the left aspect of the superior endplate of L4, suspicious for mild compression fracture (series 14, image 12). No bony retropulsion. This is benign in appearance, with no underlying lesion identified. No worrisome osseous lesions or evidence for metastatic disease identified within the lumbar spine. Prominent reactive marrow edema about the left L3-4 facet due to advanced facet degeneration. Conus medullaris: Extends to the L1 level and  appears normal. Paraspinal and other soft tissues: Paraspinous soft tissues within normal limits. Visualized visceral structures unremarkable. Disc levels: L1-2:  Mild facet and ligament flavum hypertrophy.  No stenosis. L2-3: Minimal disc bulge with bilateral facet hypertrophy. No significant stenosis. L3-4: Disc desiccation with minimal disc bulge. Prominent anterior bridging osteophyte. Advanced bilateral facet degeneration with associated reactive marrow edema, left greater than right. 7 mm synovial cyst noted at the posterior aspect of the right L3-4 facet. Mild canal with bilateral L3 foraminal stenosis. L4-5: Chronic intervertebral disc mild diffuse disc bulge. Moderate to advanced facet arthrosis, left greater than right. Resultant moderate canal with bilateral subarticular stenosis. Foramina remain patent. L5-S1: Chronic intervertebral disc space narrowing with diffuse disc bulge. Left greater than right facet degeneration. Resultant moderate to severe left lateral recess narrowing. Mild right with mild to moderate left L5 foraminal stenosis. IMPRESSION: MRI THORACIC SPINE IMPRESSION 1. 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 Neuro surgical consultation recommended. 2. Superimposed  sequelae of prior T1 through T5 fusion for T3 decompression. 3. Stable disc protrusions at T7-8 and T8-9. 4. Prominent facet arthropathy at T12-L1 on the right with associated reactive marrow edema. MRI LUMBAR SPINE IMPRESSION 1. No evidence for metastatic disease within the lumbar spine. 2. Mild acute to subacute endplate compression deformity involving the left aspect of the superior endplate of L4. This is benign in appearance with no underlying pathologic lesion. 3. Advanced bilateral facet degeneration at L3-4 with associated reactive marrow edema, left greater than right. 4. Multilevel degenerative spondylolysis as above, most notable at L4-5 and L5-S1. Resultant moderate canal stenosis at L4-5, with moderate to severe left lateral recess narrowing at L5-S1. Critical Value/emergent results were called by telephone at the time of interpretation on 09/02/2017 at 8:30 pm to the ER PA [place] , who verbally acknowledged these results. Electronically Signed   By: Jeannine Boga M.D.   On: 09/02/2017 20:31   Dg C-arm 1-60 Min  Result Date: 09/11/2017 CLINICAL DATA:  Intramedullary femoral nailing. EXAM: DG C-ARM 61-120 MIN; LEFT FEMUR 2 VIEWS FLUOROSCOPY TIME:  1 minutes, 41 seconds. C-arm fluoroscopic images were obtained intraoperatively and submitted for post operative interpretation. COMPARISON:  Left femur x-rays from yesterday. FINDINGS: Multiple intraoperative x-rays demonstrate interval retrograde intramedullary rod placement of the femur with proximal and distal interlocking screws. No hardware complication. Improved alignment of the oblique distal femoral fracture with persistent slight medial displacement. IMPRESSION: Left distal femur fracture fixation with intramedullary rod. Electronically Signed   By: Titus Dubin M.D.   On: 09/11/2017 12:55   Dg C-arm 1-60 Min  Result Date: 09/11/2017 CLINICAL DATA:  Intramedullary femoral nailing. EXAM: DG C-ARM 61-120 MIN; LEFT FEMUR 2 VIEWS  FLUOROSCOPY TIME:  1 minutes, 41 seconds. C-arm fluoroscopic images were obtained intraoperatively and submitted for post operative interpretation. COMPARISON:  Left femur x-rays from yesterday. FINDINGS: Multiple intraoperative x-rays demonstrate interval retrograde intramedullary rod placement of the femur with proximal and distal interlocking screws. No hardware complication. Improved alignment of the oblique distal femoral fracture with persistent slight medial displacement. IMPRESSION: Left distal femur fracture fixation with intramedullary rod. Electronically Signed   By: Titus Dubin M.D.   On: 09/11/2017 12:55   Dg Femur Min 2 Views Left  Result Date: 09/11/2017 CLINICAL DATA:  Intramedullary femoral nailing. EXAM: DG C-ARM 61-120 MIN; LEFT FEMUR 2 VIEWS FLUOROSCOPY TIME:  1 minutes, 41 seconds. C-arm fluoroscopic images were obtained  intraoperatively and submitted for post operative interpretation. COMPARISON:  Left femur x-rays from yesterday. FINDINGS: Multiple intraoperative x-rays demonstrate interval retrograde intramedullary rod placement of the femur with proximal and distal interlocking screws. No hardware complication. Improved alignment of the oblique distal femoral fracture with persistent slight medial displacement. IMPRESSION: Left distal femur fracture fixation with intramedullary rod. Electronically Signed   By: Titus Dubin M.D.   On: 09/11/2017 12:55   Dg Femur Port Min 2 Views Left  Result Date: 09/11/2017 CLINICAL DATA:  Fracture. INTRAMEDULLARY (IM) RETROGRADE FEMORAL NAILING PERFORMED EARLIER TODAY. EXAM: LEFT FEMUR PORTABLE 2 VIEWS COMPARISON:  CT of the pelvis on 09/09/2017 FINDINGS: Status post placement of intramedullary nail across oblique distal femur fracture. Cortical screws transfix the proximal and distal aspects. There is deformity of the LEFT inferior pubic ramus consistent with old injury. Degenerative changes are seen at the acetabulum. IMPRESSION: IM nail of  the LEFT femur. Electronically Signed   By: Nolon Nations M.D.   On: 09/11/2017 16:45   Dg Femur Port Min 2 Views Left  Result Date: 09/11/2017 CLINICAL DATA:  Status post ORIF of left femoral fracture EXAM: LEFT FEMUR PORTABLE 2 VIEWS COMPARISON:  09/10/2017 FINDINGS: Medullary rod is now noted in place. The distal femoral fracture is again identified in near anatomic alignment. No other focal abnormality is seen. IMPRESSION: Distal femoral fracture with medullary rod in place. Electronically Signed   By: Inez Catalina M.D.   On: 09/11/2017 14:54   Dg Femur Port Min 2 Views Left  Result Date: 09/10/2017 CLINICAL DATA:  52 y/o  F; left femur fracture. EXAM: LEFT FEMUR PORTABLE 2 VIEWS COMPARISON:  09/10/2017 left knee radiographs FINDINGS: Acute oblique extra-articular fracture of the distal left femur metadiaphysis with 1/2 shaft's width lateral displacement of the shaft relative to distal component and mild dorsal apex angulation. Knee joint is maintained. No knee joint effusion. Bones are diffusely demineralized. IMPRESSION: Acute oblique extra-articular fracture of the distal left femur metadiaphysis with 1/2 shaft's width lateral displacement of the shaft relative to distal component and mild dorsal apex angulation. Electronically Signed   By: Kristine Garbe M.D.   On: 09/10/2017 22:37   US Abdomen Limited Ruq  Result Date: 09/16/2017 CLINICAL DATA:  Nausea vomiting for 1 week, history of cholelithiasis EXAM: ULTRASOUND ABDOMEN LIMITED RIGHT UPPER QUADRANT COMPARISON:  08/10/2017 FINDINGS: Gallbladder: Gallbladder is well distended with evidence of cholelithiasis. No gallbladder wall thickening or pericholecystic fluid is noted. Common bile duct: Diameter: 5.5 mm. Liver: Increased coarsened echotexture consistent with fatty infiltration. Portal vein is patent on color Doppler imaging with normal direction of blood flow towards the liver. IMPRESSION: Cholelithiasis without complicating  factors. Changes consistent with fatty infiltration of the liver. Electronically Signed   By: Inez Catalina M.D.   On: 09/16/2017 16:33     CBC Recent Labs  Lab 09/14/17 0848 09/15/17 2328 09/16/17 0742 09/17/17 0539 09/18/17 0612  WBC 8.6 11.4* 10.5 8.7 5.2  HGB 8.0* 8.4* 8.1* 7.9* 7.0*  HCT 25.5* 26.2* 25.7* 25.1* 22.3*  PLT 166 176 212 194 161  MCV 98.1 95.6 96.3 96.2 97.8  MCH 30.8 30.7 30.3 30.3 30.7  MCHC 31.4 32.1 31.5 31.5 31.4  RDW 16.8* 17.1* 17.3* 17.5* 17.2*  LYMPHSABS  --  0.8  --   --   --   MONOABS  --  0.7  --   --   --   EOSABS  --  0.0  --   --   --  BASOSABS  --  0.0  --   --   --     Chemistries  Recent Labs  Lab 09/14/17 0407 09/15/17 1601 09/15/17 2328 09/16/17 0617 09/16/17 1010 09/17/17 0539 09/18/17 0612  NA 136  --  135 135  --  134* 135  K 4.9  --  5.1 4.9  --  5.3* 5.2*  CL 106  --  102 102  --  101 104  CO2 27  --  23 25  --  27 26  GLUCOSE 189*  --  159* 145*  --  118* 125*  BUN 27*  --  34* 31*  --  26* 23*  CREATININE 1.02*  --  1.08* 0.96  --  1.08* 0.94  CALCIUM 7.2* 7.2* 7.7* 7.7*  --  7.6* 7.6*  MG  --   --   --   --  2.2  --   --   AST  --   --  20  --   --  20 18  ALT  --   --  17  --   --  20 15  ALKPHOS  --   --  151*  --   --  137* 132*  BILITOT  --   --  1.4*  --   --  1.6* 1.7*   ------------------------------------------------------------------------------------------------------------------ estimated creatinine clearance is 95.7 mL/min (by C-G formula based on SCr of 0.94 mg/dL). ------------------------------------------------------------------------------------------------------------------ No results for input(s): HGBA1C in the last 72 hours. ------------------------------------------------------------------------------------------------------------------ No results for input(s): CHOL, HDL, LDLCALC, TRIG, CHOLHDL, LDLDIRECT in the last 72  hours. ------------------------------------------------------------------------------------------------------------------ No results for input(s): TSH, T4TOTAL, T3FREE, THYROIDAB in the last 72 hours.  Invalid input(s): FREET3 ------------------------------------------------------------------------------------------------------------------ No results for input(s): VITAMINB12, FOLATE, FERRITIN, TIBC, IRON, RETICCTPCT in the last 72 hours.  Coagulation profile Recent Labs  Lab 09/15/17 2328  INR 1.17    No results for input(s): DDIMER in the last 72 hours.  Cardiac Enzymes Recent Labs  Lab 09/15/17 2328  TROPONINI <0.03   ------------------------------------------------------------------------------------------------------------------ Invalid input(s): POCBNP   CBG: Recent Labs  Lab 09/17/17 2335 09/18/17 0535 09/18/17 0733 09/18/17 1206 09/18/17 1654  GLUCAP 115* 122* 112* 152* 160*       Studies: No results found.    Lab Results  Component Value Date   HGBA1C 5.5 08/09/2017   HGBA1C 5.9 08/05/2016   HGBA1C 5.7 (H) 07/06/2015   Lab Results  Component Value Date   LDLCALC 98 06/07/2015   CREATININE 0.94 09/18/2017       Scheduled Meds: . dexamethasone  4 mg Intravenous Q6H  . famotidine  20 mg Oral BID  . fluticasone  1 spray Each Nare BID  . insulin aspart  0-9 Units Subcutaneous Q4H  . levothyroxine  137 mcg Oral QAC breakfast  . pantoprazole  40 mg Oral Daily  . polyethylene glycol  17 g Oral BID  . rivaroxaban  20 mg Oral Q supper  . senna-docusate  2 tablet Oral QHS   Continuous Infusions: . sodium chloride 30 mL/hr at 09/18/17 1813     LOS: 2 days      [facility] Pager [phone]. If 7PM-7AM, please contact night-coverage at [email or web address], password [facility] 09/18/2017, 7:20 PM  LOS: 2 days

## 2017-09-18 NOTE — Progress Notes (Signed)
PT Cancellation Note  Patient Details Name: Sarah [name] MRN: [id number] DOB: [birth year]   Cancelled Treatment:     Attempted to do acute PT eval.  Pt starting a unit of blood and she doesn't feel well.  She is thinking she will go back to rehab later today or tomorrow.  I called rehab coordinator and left message to verify.  Agree with her plan of getting back to CIR when medically stable.   Loyal Buba 09/18/2017, 11:22 AM

## 2017-09-18 NOTE — Progress Notes (Signed)
Inpatient Rehabilitation Admissions Coordinator  I am unable to proceed to pursue insurance approval with [facility] for readmission to CIR until therapy assessment completed. I spoke with patient at bedside to explain process. Pt requesting daily suppository to assist with bowel movements. Please advise. I will follow up on Monday.  Danne Baxter, RN, MSN Rehab Admissions Coordinator [phone] 09/18/2017 11:51 AM

## 2017-09-18 NOTE — Care Management Important Message (Signed)
Important Message  Patient Details  Name: Sarah [name] MRN: [id number] Date of Birth: 1965/07/25   Medicare Important Message Given:  Yes    Orbie Pyo 09/18/2017, 2:48 PM

## 2017-09-18 NOTE — Progress Notes (Signed)
Occupational Therapy Evaluation Patient Details Name: Sarah [name] MRN: [id number] DOB: Apr 01, 1965 Today's Date: 09/18/2017    History of Present Illness [name] a 52 y/o female initially admitted to [facility] on 09/02/17 due to worsening L Eweakness and fall at home. MRI revealed tumor recurrence at T3 with severe spinal cord compression. Posterior spinal tumor resection on 09/03/17. Admitted to CIR on 09/09/17. On 09/10/17 noted to have displaced oblique distal left femoral metadiaphyseal fracture with IM nailing on 09/11/17.  Participating with PT/OT. Noted patient admitted and discharged same day from CIR due to intractable nausea and vomiting. 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    Clinical Impression   Pt admitted to acute care from CIR due to medical complications. Pt currently requires Max A +2 with slide board transfers toward R side and set up for UB ADL and Total A for LB ADL. Pt motivated to become more independent with self care and mobility. Very supportive family. Recommend return to CIR for intensive rehab to facilitate safe DC home.  Recommend Air - overlay mattress given higher risk for skin breakdown due to incontinence and poor mobility Recommend B Prevalon boots  To be worn while in bed    Follow Up Recommendations  CIR;Supervision/Assistance - 24 hour    Equipment Recommendations  Other (comment)(TBA at CIR)    Recommendations for Other Services Rehab consult     Precautions / Restrictions Precautions Precautions: Fall;Back Precaution Comments: throracic region, LEs spasicity  Restrictions Weight Bearing Restrictions: Yes LLE Weight Bearing: Non weight bearing      Mobility Bed Mobility Overal bed mobility: Needs Assistance Bed Mobility: Supine to Sit     Supine to sit: Max assist;+2 for physical assistance;HOB elevated     General bed  mobility comments: Used bed pad to assist with mobilizing pt to EOB  Transfers Overall transfer level: Needs assistance Equipment used: Sliding board Transfers: Lateral/Scoot Transfers          Lateral/Scoot Transfers: Max assist;+2 physical assistance General transfer comment: patient knowledgable regarding transfer and need for correct positioning. Verbal cueing for hand placement and weight shift    Balance Overall balance assessment: Needs assistance Sitting-balance support: Bilateral upper extremity supported;Feet supported Sitting balance-Leahy Scale: Poor Sitting balance - Comments: able to tolerate sitting EOB for ~4 minutes with min assist to maintain upright posture; BUE support required                                   ADL either performed or assessed with clinical judgement   ADL Overall ADL's : Needs assistance/impaired     Grooming: Set up;Sitting   Upper Body Bathing: Set up;Sitting   Lower Body Bathing: Maximal assistance;Bed level   Upper Body Dressing : Minimal assistance;Bed level   Lower Body Dressing: Total assistance;Bed level   Toilet Transfer: Maximal assistance;+2 for physical assistance(simulated)   Toileting- Clothing Manipulation and Hygiene: Total assistance Toileting - Clothing Manipulation Details (indicate cue type and reason): incontinenet     Functional mobility during ADLs: Maximal assistance;+2 for physical assistance       Vision         Perception     Praxis      Pertinent Vitals/Pain Pain Assessment: Faces Faces Pain Scale: Hurts a little bit Pain Location: back  Pain Descriptors /  Indicators: Guarding;Sore Pain Intervention(s): Limited activity within patient's tolerance     Hand Dominance Right   Extremity/Trunk Assessment Upper Extremity Assessment Upper Extremity Assessment: Generalized weakness   Lower Extremity Assessment Lower Extremity Assessment: Defer to PT evaluation RLE Deficits /  Details: little to no intentional motion noted at B LE due to T4 para; spasticity noted; little to no sensation RLE Sensation: decreased light touch LLE Deficits / Details: little to no intentional motion noted at B LE due to T4 para; spasticity noted; little to no sensation LLE Sensation: decreased light touch   Cervical / Trunk Assessment Cervical / Trunk Assessment: Kyphotic Cervical / Trunk Exceptions: little trunk control/core activation due to T4 para; sensation just above unbilicus   Communication Communication Communication: No difficulties   Cognition Arousal/Alertness: Awake/alert Behavior During Therapy: WFL for tasks assessed/performed Overall Cognitive Status: Within Functional Limits for tasks assessed                                     General Comments  incision at back with dressing applied - OT reaching out to MD for change of dressing orders; incision at L knee clean and dry    Exercises     Shoulder Instructions      Home Living Family/patient expects to be discharged to:: Inpatient rehab Living Arrangements: Other relatives Available Help at Discharge: Available PRN/intermittently Type of Home: House Home Access: Ramped entrance     Home Layout: One level     Bathroom Shower/Tub: Teacher, early years/pre: Handicapped height Bathroom Accessibility: No   Home Equipment: Environmental consultant - 2 wheels;Bedside commode;Shower seat;Wheelchair - power;Wheelchair - manual;Hospital bed;Other (comment)   Additional Comments: Pt reports using bedside commode  Lives With: Family    Prior Functioning/Environment Level of Independence: Needs assistance  Gait / Transfers Assistance Needed: Prior to tumor resection, was performing slide board transfers and short distance ambulation. At rehab, was utilizing Dora for sit<>stand.  ADL's / Homemaking Assistance Needed: Assistance for LB ADL. Able to complete UB ADL without assistance at bed level.     Comments: Pt uses manual chair most often for past 3 years. Pt was using RW for short distances with therapy only  until May 2019. Pt reports staying home most days with her nephew        OT Problem List: Decreased strength;Decreased range of motion;Impaired balance (sitting and/or standing);Decreased knowledge of use of DME or AE;Impaired tone;Obesity;Pain;Increased edema      OT Treatment/Interventions: Self-care/ADL training;Therapeutic exercise;Neuromuscular education;Energy conservation;DME and/or AE instruction;Manual therapy;Modalities;Splinting;Therapeutic activities;Cognitive remediation/compensation;Visual/perceptual remediation/compensation;Patient/family education;Balance training    OT Goals(Current goals can be found in the care plan section) Acute Rehab OT Goals Patient Stated Goal: return to CIR OT Goal Formulation: With patient Time For Goal Achievement: 10/02/17 Potential to Achieve Goals: Good  OT Frequency: Min 2X/week   Barriers to D/C:            Co-evaluation PT/OT/SLP Co-Evaluation/Treatment: Yes Reason for Co-Treatment: Complexity of the patient's impairments (multi-system involvement) PT goals addressed during session: Mobility/safety with mobility OT goals addressed during session: ADL's and self-care      AM-PAC PT "6 Clicks" Daily Activity     Outcome Measure Help from another person eating meals?: None Help from another person taking care of personal grooming?: None Help from another person toileting, which includes using toliet, bedpan, or urinal?: Total Help from another person bathing (including washing,  rinsing, drying)?: A Lot Help from another person to put on and taking off regular upper body clothing?: A Little Help from another person to put on and taking off regular lower body clothing?: Total 6 Click Score: 15   End of Session Equipment Utilized During Treatment: Gait belt;Other (comment)(sliding board) Nurse Communication: Mobility  status;Need for lift equipment;Other (comment);Precautions;Weight bearing status(Need for B prevalon boots)  Activity Tolerance: Patient tolerated treatment well Patient left: in chair;with call bell/phone within reach;with family/visitor present(Maximove pad in chair)  OT Visit Diagnosis: Muscle weakness (generalized) (M62.81);Pain;Other abnormalities of gait and mobility (R26.89) Pain - part of body: (back)                Time: 2505-3976 OT Time Calculation (min): 30 min Charges:  OT General Charges $OT Visit: 1 Visit OT Evaluation $OT Eval Moderate Complexity: 1 Mod G-Codes:     Maurie Boettcher, OT/L  OT Clinical Specialist [phone]   [facility] 09/18/2017, 5:25 PM

## 2017-09-18 NOTE — Evaluation (Signed)
Physical Therapy Evaluation Patient Details Name: Sarah [name] [name] MRN: [id number] DOB: February 27, 1966 Today's Date: 09/18/2017   History of Present Illness  [name] a 52 y/o female initially admitted to [facility] on 09/02/17 due to worsening L Eweakness and fall at home. MRI revealed tumor recurrence at T3 with severe spinal cord compression. Posterior spinal tumor resection on 09/03/17. Admitted to CIR on 09/09/17. On 09/10/17 noted to have displaced oblique distal left femoral metadiaphyseal fracture with IM nailing on 09/11/17.  Participating with PT/OT. Noted patient admitted and discharged same day from CIR due to intractable nausea and vomiting. 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   Clinical Impression  Sarah [name] [name] is a very pleasant 52 y/o female admitted with the above listed diagnoses. Motivated to work with PT/OT on mobility and transfers today. Re-admitted from CIR due to N&V. Patient today requiring Max A +2 for bed mobility and slide board transfer to recliner (due to NWB orders for recent IM nailing of L distal femur) with ability to sit EOB x 4 min with very light support from PT to maintain balance. Pt currently recommending return to CIR to continue to progress and improve safe functional mobility.     Follow Up Recommendations CIR;Supervision/Assistance - 24 hour    Equipment Recommendations  None recommended by PT    Recommendations for Other Services Rehab consult     Precautions / Restrictions Precautions Precautions: Fall;Back Precaution Comments: throracic region, LEs spasicity  Restrictions Weight Bearing Restrictions: Yes LLE Weight Bearing: Non weight bearing      Mobility  Bed Mobility Overal bed mobility: Needs Assistance Bed Mobility: Supine to Sit     Supine to sit: Max assist;+2 for physical assistance;HOB elevated         Transfers Overall transfer level: Needs assistance Equipment used: Sliding board Transfers: Lateral/Scoot Transfers          Lateral/Scoot Transfers: Max assist;+2 physical assistance General transfer comment: pateitn knowledgable regarding trasnfer and need for correct positioning. Verbal cueing for hand placement and weight shift  Ambulation/Gait                Stairs            Wheelchair Mobility    Modified Rankin (Stroke Patients Only)       Balance Overall balance assessment: Needs assistance Sitting-balance support: Bilateral upper extremity supported;Feet supported Sitting balance-Leahy Scale: Poor Sitting balance - Comments: able to tolerate sitting EOB for ~4 minutes with light assist to maintain upright posture                                     Pertinent Vitals/Pain Pain Assessment: No/denies pain    Home Living   Living Arrangements: Other relatives Available Help at Discharge: Available PRN/intermittently Type of Home: House Home Access: Ramped entrance     Home Layout: One level Home Equipment: Walker - 2 wheels;Bedside commode;Shower seat;Wheelchair - power;Wheelchair - manual;Hospital bed;Other (comment) Additional Comments: Pt reports using bedside commode    Prior Function Level of Independence: Needs assistance   Gait / Transfers Assistance Needed: Prior to tumor resection, was performing slide board transfers and short distance ambulation. At rehab, was utilizing [place] for sit<>stand.   ADL's / Homemaking Assistance Needed: Assistance for LB ADL. Able to complete UB ADL  without assistance at bed level.   Comments: Pt uses manual chair most often for past 3 years. Pt was using RW for short distances with therapy only  until May 2019. Pt reports staying home most days with her nephew     Hand Dominance   Dominant Hand: Right    Extremity/Trunk Assessment   Upper Extremity Assessment Upper Extremity  Assessment: Defer to OT evaluation    Lower Extremity Assessment RLE Deficits / Details: little to no intentional motion noted at B LE due to T4 para; spasticity noted; little to no sensation RLE Sensation: decreased light touch LLE Deficits / Details: little to no intentional motion noted at B LE due to T4 para; spasticity noted; little to no sensation LLE Sensation: decreased light touch    Cervical / Trunk Assessment Cervical / Trunk Assessment: Kyphotic Cervical / Trunk Exceptions: little trunk control/core activation due to T4 para  Communication   Communication: No difficulties  Cognition Arousal/Alertness: Awake/alert Behavior During Therapy: WFL for tasks assessed/performed Overall Cognitive Status: Within Functional Limits for tasks assessed                                        General Comments General comments (skin integrity, edema, etc.): incision at back with dressing applied - OT reaching out to MD for change of dressing orders; incision at L knee clean and dry    Exercises     Assessment/Plan    PT Assessment Patient needs continued PT services  PT Problem List Decreased strength;Decreased range of motion;Decreased activity tolerance;Decreased balance;Decreased mobility;Decreased coordination;Decreased knowledge of use of DME;Decreased safety awareness;Decreased knowledge of precautions;Pain       PT Treatment Interventions DME instruction;Functional mobility training;Therapeutic activities;Therapeutic exercise;Balance training;Neuromuscular re-education;Patient/family education    PT Goals (Current goals can be found in the Care Plan section)  Acute Rehab PT Goals Patient Stated Goal: return to CIR PT Goal Formulation: With patient Time For Goal Achievement: 09/25/17 Potential to Achieve Goals: Fair    Frequency Min 4X/week   Barriers to discharge Decreased caregiver support      Co-evaluation PT/OT/SLP Co-Evaluation/Treatment:  Yes Reason for Co-Treatment: Complexity of the patient's impairments (multi-system involvement);For patient/therapist safety PT goals addressed during session: Mobility/safety with mobility         AM-PAC PT "6 Clicks" Daily Activity  Outcome Measure Difficulty turning over in bed (including adjusting bedclothes, sheets and blankets)?: Unable Difficulty moving from lying on back to sitting on the side of the bed? : Unable Difficulty sitting down on and standing up from a chair with arms (e.g., wheelchair, bedside commode, etc,.)?: Unable Help needed moving to and from a bed to chair (including a wheelchair)?: A Lot Help needed walking in hospital room?: Total Help needed climbing 3-5 steps with a railing? : Total 6 Click Score: 7    End of Session Equipment Utilized During Treatment: Gait belt Activity Tolerance: Patient tolerated treatment well Patient left: in chair;with call bell/phone within reach;with family/visitor present Nurse Communication: Mobility status;Need for lift equipment PT Visit Diagnosis: Other abnormalities of gait and mobility (R26.89);Pain;Muscle weakness (generalized) (M62.81) Pain - Right/Left: Left Pain - part of body: Knee    Time: 8315-1761 PT Time Calculation (min) (ACUTE ONLY): 30 min   Charges:   PT Evaluation $PT Eval Moderate Complexity: 1 Mod     PT G Codes:        Lanney Gins,  PT, DPT 09/18/17 4:57 PM

## 2017-09-19 LAB — GLUCOSE, CAPILLARY
GLUCOSE-CAPILLARY: 120 mg/dL — AB (ref 70–99)
GLUCOSE-CAPILLARY: 111 mg/dL — AB (ref 70–99)
GLUCOSE-CAPILLARY: 105 mg/dL — AB (ref 70–99)
Glucose-Capillary: 100 mg/dL — ABNORMAL HIGH (ref 70–99)
Glucose-Capillary: 119 mg/dL — ABNORMAL HIGH (ref 70–99)
Glucose-Capillary: 121 mg/dL — ABNORMAL HIGH (ref 70–99)
Glucose-Capillary: 140 mg/dL — ABNORMAL HIGH (ref 70–99)

## 2017-09-19 LAB — TYPE AND SCREEN
ABO/RH(D): O POS
Antibody Screen: NEGATIVE
Unit division: 0

## 2017-09-19 LAB — CBC
HEMATOCRIT: 27 % — AB (ref 36.0–46.0)
HEMOGLOBIN: 8.7 g/dL — AB (ref 12.0–15.0)
MCH: 30.2 pg (ref 26.0–34.0)
MCHC: 32.2 g/dL (ref 30.0–36.0)
MCV: 93.8 fL (ref 78.0–100.0)
Platelets: 175 10*3/uL (ref 150–400)
RBC: 2.88 MIL/uL — ABNORMAL LOW (ref 3.87–5.11)
RDW: 19.3 % — AB (ref 11.5–15.5)
WBC: 7.1 10*3/uL (ref 4.0–10.5)

## 2017-09-19 LAB — BPAM RBC
BLOOD PRODUCT EXPIRATION DATE: 201907052359
ISSUE DATE / TIME: 201906281158
UNIT TYPE AND RH: 5100

## 2017-09-19 MED ORDER — BISACODYL 10 MG RE SUPP
10.0000 mg | Freq: Once | RECTAL | Status: AC
  Administered 2017-09-19: 10 mg via RECTAL
  Filled 2017-09-19: qty 1

## 2017-09-19 MED ORDER — SENNA 8.6 MG PO TABS
2.0000 | ORAL_TABLET | Freq: Once | ORAL | Status: AC
  Administered 2017-09-19: 17.2 mg via ORAL
  Filled 2017-09-19: qty 2

## 2017-09-19 MED ORDER — SENNA 8.6 MG PO TABS
2.0000 | ORAL_TABLET | Freq: Every day | ORAL | Status: DC
  Filled 2017-09-19: qty 2

## 2017-09-19 MED ORDER — DEXAMETHASONE 4 MG PO TABS
4.0000 mg | ORAL_TABLET | Freq: Three times a day (TID) | ORAL | Status: DC
  Administered 2017-09-19 – 2017-09-21 (×7): 4 mg via ORAL
  Filled 2017-09-19 (×7): qty 1

## 2017-09-19 MED ORDER — BACLOFEN 20 MG PO TABS
20.0000 mg | ORAL_TABLET | Freq: Every day | ORAL | Status: DC
  Administered 2017-09-19: 20 mg via ORAL
  Filled 2017-09-19 (×2): qty 1

## 2017-09-19 NOTE — Progress Notes (Signed)
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 Constipation improved, patient also had right upper quadrant pain and had a negative gallbladder ultrasound.    Subjective--- no chest pains, abdominal pain, no new concerns, female friend at bedside, questions answered  Assessment and plan  1)Intractable nausea and vomiting- clinically and radiologically patient was constipated, improving with laxatives, x-ray of the abdomen shows moderate stool burden , tolerating solid food well ,  no further emesis, give senna nightly and MiraLAX twice daily to maintain regular BMs  2)Metastatic thyroid cancer to the spine status post recent resection--Decadron was switched from IV to p.o. 4 mg 3 times daily   3)H/o PE- c/n Sarah [name], please note that Sarah [name] was not confirmed, patient has tolerated heparin in the past  4) postoperative hypothyroidism--stable, continue thyroid replacement  5)Lt Femur Fx- s/p ORIF, baclofen nightly, awaiting transfer to rehab  6) steroid-induced hyperglycemia-- Use Sarah [name]/Sarah [name] Sliding scale insulin with Accu-Cheks/Fingersticks as ordered   7) chronic anemia-stable, multifactorial, transfuse as clinically indicated no evidence of ongoing bleeding at this time  8)HTN-  may use  IV Hydralazine 10 mg  Every 4 hours Prn for systolic blood pressure over 160 mmhg  9)Hypocalcemia--replace calcium as ordered  10)HyperKalemia---Sarah [name] ordered   Sarah [name] prophylaxsis - Sarah [name]   Code Status:  Full code   Family Communication: Discussed in detail with the patient, all imaging results, lab results explained to the patient   Disposition Plan:    Sarah [name] (awaiting insurance approval for rehab)  Antibiotics: Anti-infectives (From admission, onward)   None        Objective: Vitals:   09/18/17 1235 09/18/17 1412 09/19/17 0548 09/19/17 1342  BP: 124/74 130/87 (!) 141/81 (!) 141/81  Pulse:  86 71 72  Resp: 17 18 16  (!) 21  Temp: 98.3 F (36.8 C)  98.1 F (36.7 C) 98.2 F (36.8 C)  TempSrc: Oral  Oral Oral  SpO2: 99% 100% 100% 100%  Weight:      Height:        Intake/Output Summary (Last 24 hours) at 09/19/2017 1610 Last data filed at 09/19/2017 1100 Gross per 24 hour  Intake 622.38 ml  Output 1800 ml  Net -1177.62 ml    Exam:  Physical Exam  Gen:- Awake Alert,  In no apparent distress  HEENT:- Sarah [name].AT, No sclera icterus Neck-Supple Neck,No JVD,. Thyroid scar Lungs-  CTAB , good air movement CV- S1, S2 normal Abd-  +ve B.Sounds, Abd Soft, No tenderness,    Extremity/Skin:- Left foot bruising /echymosis Psych-affect is appropriate, oriented x3 Neuro-no new focal deficits, no tremors     Micro Results No results found for this or any previous visit (from the past 240 hour(s)).  Radiology Reports Dg Shoulder Right  Result Date: 09/02/2017 CLINICAL DATA:  Pain following fall EXAM: RIGHT SHOULDER - 2+ VIEW COMPARISON:  None. FINDINGS: Frontal and Y scapular images were obtained. There is no appreciable fracture or dislocation. There is moderate generalized osteoarthritic change. No erosive change or intra-articular calcification. Visualized right lung is clear. Postoperative changes present in the upper thoracic region. IMPRESSION: Moderate  osteoarthritic change in the right shoulder region. No fracture or dislocation. Electronically Signed   By: Lowella Grip III M.D.   On: 09/02/2017 14:04   Dg Knee 1-2 Views Left  Result Date: 09/10/2017 CLINICAL DATA:  Pain LEFT knee, recent fall on 09/02/2017 EXAM: LEFT KNEE - 1-2 VIEW COMPARISON:  None FINDINGS: Osseous demineralization. Joint spaces preserved. Oblique fracture of the distal LEFT femoral metadiaphysis mildly displaced medially and posteriorly. No additional fracture, dislocation or bone destruction. No knee joint effusion. IMPRESSION: Displaced oblique distal LEFT femoral metadiaphyseal fracture. Findings called to [place] on [facility] on 09/10/2017 at 1317 hours. Electronically Signed   By: Lavonia Dana M.D.   On: 09/10/2017 13:17   Dg Abd 1 View  Result Date: 09/15/2017 CLINICAL DATA:  Abdominal pain, nausea and vomiting EXAM: ABDOMEN - 1 VIEW COMPARISON:  08/09/2017 Sarah [name] abdomen/pelvis FINDINGS: No dilated small bowel loops. Moderate colorectal stool volume. No evidence of pneumatosis or pneumoperitoneum. No radiopaque nephrolithiasis. Mild levocurvature of the lumbar spine. Marked lumbar spine degenerative changes. Partially visualized surgical fixation hardware in the proximal left femur. IMPRESSION: Nonobstructive bowel gas pattern. Moderate colorectal stool volume, which may indicate constipation. Electronically Signed   By: Ilona Sorrel M.D.   On: 09/15/2017 22:33   Sarah [name] Head Wo Contrast  Result Date: 09/02/2017 CLINICAL DATA:  Status post fall moving from a bed to a wheelchair today. Initial encounter. EXAM: Sarah [name] HEAD WITHOUT CONTRAST Sarah [name] CERVICAL SPINE WITHOUT CONTRAST TECHNIQUE: Multidetector Sarah [name] imaging of the head and cervical spine was performed following the standard protocol without intravenous contrast. Multiplanar Sarah [name] image reconstructions of the cervical spine were also generated. COMPARISON:  Head Sarah [name] scan 12/22/2016.  PET Sarah [name] scan 05/20/2017. FINDINGS: Sarah [name] HEAD  FINDINGS Brain: No evidence of acute infarction, hemorrhage, hydrocephalus, extra-axial collection or mass lesion/mass effect. Vascular: A few atherosclerotic calcifications are seen. Skull: Intact.  No focal lesion. Sinuses/Orbits: Mild mucosal thickening is noted in the maxillary sinuses, frontal sinuses, sphenoid sinus and scattered ethmoid air cells. Other: None. Sarah [name] CERVICAL SPINE FINDINGS Alignment: Maintained. Skull base and vertebrae: No acute fracture. No primary bone lesion or focal pathologic process. Soft tissues and spinal canal: No prevertebral fluid or swelling. No visible canal hematoma. Disc levels: Intervertebral disc space height is maintained. There is some endplate spurring most notable at C3-4 and C5-6. Partial visualization of thoracic fusion hardware noted. Upper chest: 0.5 cm left upper lobe pulmonary nodule is seen on prior PET Sarah [name] scan is noted. Other: None. IMPRESSION: No acute abnormality head or cervical spine. Atherosclerosis. Sinus disease. Mild appearing cervical spondylosis. No change in 0.5 cm left upper lobe pulmonary nodule consistent with the patient's known metastatic thyroid carcinoma. Electronically Signed   By: Inge Rise M.D.   On: 09/02/2017 14:37   Sarah [name] Cervical Spine Wo Contrast  Result Date: 09/02/2017 CLINICAL DATA:  Status post fall moving from a bed to a wheelchair today. Initial encounter. EXAM: Sarah [name] HEAD WITHOUT CONTRAST Sarah [name] CERVICAL SPINE WITHOUT CONTRAST TECHNIQUE: Multidetector Sarah [name] imaging of the head and cervical spine was performed following the standard protocol without intravenous contrast. Multiplanar Sarah [name] image reconstructions of the cervical spine were also generated. COMPARISON:  Head Sarah [name] scan 12/22/2016.  PET Sarah [name] scan 05/20/2017. FINDINGS: Sarah [name] HEAD FINDINGS  Brain: No evidence of acute infarction, hemorrhage, hydrocephalus, extra-axial collection or mass lesion/mass effect. Vascular: A few atherosclerotic calcifications are seen. Skull: Intact.  No focal  lesion. Sinuses/Orbits: Mild mucosal thickening is noted in the maxillary sinuses, frontal sinuses, sphenoid sinus and scattered ethmoid air cells. Other: None. Sarah [name] CERVICAL SPINE FINDINGS Alignment: Maintained. Skull base and vertebrae: No acute fracture. No primary bone lesion or focal pathologic process. Soft tissues and spinal canal: No prevertebral fluid or swelling. No visible canal hematoma. Disc levels: Intervertebral disc space height is maintained. There is some endplate spurring most notable at C3-4 and C5-6. Partial visualization of thoracic fusion hardware noted. Upper chest: 0.5 cm left upper lobe pulmonary nodule is seen on prior PET Sarah [name] scan is noted. Other: None. IMPRESSION: No acute abnormality head or cervical spine. Atherosclerosis. Sinus disease. Mild appearing cervical spondylosis. No change in 0.5 cm left upper lobe pulmonary nodule consistent with the patient's known metastatic thyroid carcinoma. Electronically Signed   By: Inge Rise M.D.   On: 09/02/2017 14:37   Sarah [name] Thoracic Spine Wo Contrast  Result Date: 09/09/2017 CLINICAL DATA:  Neck hematoma, traumatic. Bone neoplasm. Check for local recurrence EXAM: Sarah [name] THORACIC SPINE WITHOUT CONTRAST TECHNIQUE: Multidetector Sarah [name] images of the thoracic were obtained using the standard protocol without intravenous contrast. COMPARISON:  Thoracic MRI 09/02/2017.  Thoracic spine Sarah [name] 09/03/2017 FINDINGS: Alignment: Stable alignment with mild exaggerated thoracic kyphosis. Vertebrae: There is known malignant erosion of the T3, T2, and T4 bodies with chronic T3 collapse. Status post remote laminectomy with more recent reopening for recurrent tumor excision. There is posterior hardware spanning T1-T5, which is well seated. No progressive erosion or interval fracture seen. Paraspinal and other soft tissues: High-density material within the incision, both subcutaneous and deep, measuring up to 16 mm thickness, 8 cm craniocaudal, and 7 cm in depth. The  fluid is high-density compatible with blood products. At the level of the laminectomy, mass effect on the thecal sac would not be detectable by this technique. Degree of residual tumor is also not well established by this technique. Disc levels: Diffuse spondylosis with bridging osteophytes from T4 to T12. There are small calcified disc protrusions at T7-8 and T8-9. IMPRESSION: Interval T3 spinal canal tumor debulking with hemolymphatic material in the subcutaneous and deep incision measuring 40-50 cc. No detected change in osseous structures or posterior hardware. Electronically Signed   By: Monte Fantasia M.D.   On: 09/09/2017 12:51   Sarah [name] Thoracic Spine Wo Contrast  Result Date: 09/03/2017 CLINICAL DATA:  Metastatic Hurthle cell carcinoma to the thoracic spine. T3 metastasis with prior decompression. Worsening disease with cord compression on recent MRI. EXAM: Sarah [name] THORACIC SPINE WITHOUT CONTRAST TECHNIQUE: Multidetector Sarah [name] images of the thoracic were obtained using the standard protocol without intravenous contrast. COMPARISON:  Thoracic spine MRI 09/02/2017 FINDINGS: Alignment: Mildly exaggerated upper thoracic kyphosis. No listhesis. Vertebrae: Prior posterior fusion from T1-T5. Bilateral pedicle screws at each level except for T3. The screws appear intact without evidence of loosening about the T1, T2, or T5 screws. There is lucency throughout the posterosuperior aspect of the T4 vertebral body including about the distal portions of both T4 screws reflecting tumor as seen on MRI. There is a chronic, severe to pathologic T3 compression fracture. Extensive epidural tumor centered at the T3 level was more fully evaluated on yesterday's MRI. Tumor is again noted to involve the posterior and inferior aspects of the T2 vertebral body. There is slight chronic anterior wedging of the T8 vertebral body. No  definite acute fracture is identified. Flowing anterior vertebral osteophytes are present throughout the  thoracic spine extending into the lumbar spine. Paraspinal and other soft tissues: Unremarkable. Disc levels: Spondylosis more fully evaluated on yesterday's MRI, with a small calcified disc extrusion noted at T7-8. IMPRESSION: 1. Metastatic disease as described on yesterday's MRI with extensive osseous and epidural tumor from T2-T4. Chronic pathologic T3 compression fracture. 2. Prior T1-T5 posterior fusion. Tumor involves the T4 vertebral body about the distal aspects of both pedicle screws. Electronically Signed   By: Logan Bores M.D.   On: 09/03/2017 15:13   Sarah [name] Lumbar Spine Wo Contrast  Result Date: 09/09/2017 CLINICAL DATA:  Hematoma, neck.  Drop in hematocrit. EXAM: Sarah [name] LUMBAR SPINE WITHOUT CONTRAST TECHNIQUE: Multidetector Sarah [name] imaging of the lumbar spine was performed without intravenous contrast administration. Multiplanar Sarah [name] image reconstructions were also generated. COMPARISON:  09/02/2017 MRI FINDINGS: Segmentation: 5 lumbar type vertebral bodies. Alignment: Mild levoscoliosis. Vertebrae: No evidence of fracture, discitis, or aggressive bone lesion. Paraspinal and other soft tissues: Negative for hematoma. Disc levels: Congenitally narrow spinal canal with bulky spondylitic spurring and generalized degenerative facet hypertrophy throughout the lumbar levels. The spinal canal and foramina are better assessed by recent MRI. IMPRESSION: 1. Negative for hematoma. 2. Bulky spondylosis and facet arthropathy throughout the lumbar spine, with spinal stenosis exacerbated by short pedicles. See MRI from 1 week prior. Electronically Signed   By: Monte Fantasia M.D.   On: 09/09/2017 12:54   Sarah [name] Pelvis Wo Contrast  Result Date: 09/09/2017 CLINICAL DATA:  52 year old female with Hurthle cell carcinoma metastatic to the spine. EXAM: Sarah [name] PELVIS WITHOUT CONTRAST TECHNIQUE: Multidetector Sarah [name] imaging of the pelvis was performed following the standard protocol without intravenous contrast. COMPARISON:  Lumbar spine Sarah [name]  today reported separately. Sarah [name] Abdomen and Pelvis 08/09/2017. FINDINGS: Urinary Tract: Foley catheter in place. The urinary bladder is decompressed. The visible ureters are decompressed and unremarkable. Bowel: Retained stool in the rectosigmoid colon similar to the Sarah [name] last month. Visible large and small bowel loops are nondilated. Vascular/Lymphatic: Vascular patency is not evaluated in the absence of IV contrast. No lymphadenopathy. Reproductive:  Negative. Other: No pelvic free fluid. There is increased nonspecific presacral stranding. See series 4, image 32. Since May there is new widespread nonspecific subcutaneous fat stranding throughout the lateral abdominal wall and proximal lower extremities bilaterally, but greater on the left at the level of the proximal thigh as seen on series 4, image 60. no soft tissue fluid collection in these regions. Musculoskeletal: Bulky degenerative osteophytosis in the visible lower lumbar spine and about the hips, greater on the right. Bone mineralization remains within normal limits. There is no acute, destructive, or suspicious osseous lesion identified. IMPRESSION: 1. Unchanged pelvic osseous structures since May with no acute or suspicious osseous lesion identified. 2. Nonspecific bilateral lateral abdominal wall, presacral, flank, and proximal thigh subcutaneous edema/stranding. The left proximal thigh is asymmetrically more affected as seen on series 4, image 60. Query cellulitis or anasarca. Electronically Signed   By: Genevie Ann M.D.   On: 09/09/2017 21:17   Mr Lumbar Spine Wo Contrast  Result Date: 09/02/2017 CLINICAL DATA:  Initial evaluation for increased lower extremity weakness with history of metastatic Hurthle cell carcinoma to the thoracic spine. T3 metastasis with history of prior decompression. EXAM: MRI THORACIC AND LUMBAR SPINE WITHOUT AND WITH CONTRAST TECHNIQUE: Multiplanar and multiecho pulse sequences of the thoracic and lumbar spine were obtained  without and with intravenous contrast. CONTRAST:  58mL MULTIHANCE GADOBENATE DIMEGLUMINE  529 MG/ML IV SOLN COMPARISON:  Previous MRI from 12/25/2015. FINDINGS: MRI THORACIC SPINE FINDINGS Alignment: Examination mildly limited as the patient was unable to tolerate the full length of the exam. Postcontrast axial sequences as well as post-contrast imaging through the lumbar spine was not performed. Stable alignment with preservation of the normal thoracic kyphosis. No listhesis. Vertebrae: Postoperative changes from prior fusion from T1 through T5 for T3 decompression again seen. Pathologic fracture deformity of T3 is relatively unchanged in appearance. Interval growth with progressive tumor seen within the adjacent spinal canal, extending from the mid aspect of T2 through the mid aspect of T4 (series 7, image 8). There has been marked interval worsening of cord and thecal sac progression, with the sac compressed laterally and slit like in appearance, measuring approximately 2 mm in transverse diameter at its most narrow point (series 10, image 12). Suspected cord signal abnormality at this level, although poorly visualized due to susceptibility artifact from adjacent hardware. Postoperative fluid collection again noted at the adjacent laminectomy site, likely slightly decreased in size. No other metastatic lesions identified. Reactive marrow edema and enhancement about the right T12-L1 facet favored to be degenerative in nature (series 6, image 4). Vertebral body heights otherwise maintained. Mild discogenic reactive endplate changes present at the thoracolumbar junction. Cord:  No other cord signal abnormality identified. Paraspinal and other soft tissues: No other acute paraspinous soft tissue abnormality. Probable mild cholelithiasis noted. Visualized visceral structures otherwise unremarkable. Disc levels: T7-8: Central disc extrusion flattens the ventral thoracic spinal cord and resultant moderate spinal  stenosis, stable. Left paracentral disc protrusion at T8-9 with mild flattening of the left hemi cord also unchanged. MRI LUMBAR SPINE FINDINGS Segmentation: Normal segmentation. Lowest well-formed disc labeled the L5-S1 level. Alignment: Levoscoliosis. Vertebral bodies otherwise normally aligned with preservation of the normal lumbar lordosis. No listhesis. Vertebrae: Mild height loss with reactive endplate edema at the left aspect of the superior endplate of L4, suspicious for mild compression fracture (series 14, image 12). No bony retropulsion. This is benign in appearance, with no underlying lesion identified. No worrisome osseous lesions or evidence for metastatic disease identified within the lumbar spine. Prominent reactive marrow edema about the left L3-4 facet due to advanced facet degeneration. Conus medullaris: Extends to the L1 level and appears normal. Paraspinal and other soft tissues: Paraspinous soft tissues within normal limits. Visualized visceral structures unremarkable. Disc levels: L1-2:  Mild facet and ligament flavum hypertrophy.  No stenosis. L2-3: Minimal disc bulge with bilateral facet hypertrophy. No significant stenosis. L3-4: Disc desiccation with minimal disc bulge. Prominent anterior bridging osteophyte. Advanced bilateral facet degeneration with associated reactive marrow edema, left greater than right. 7 mm synovial cyst noted at the posterior aspect of the right L3-4 facet. Mild canal with bilateral L3 foraminal stenosis. L4-5: Chronic intervertebral disc mild diffuse disc bulge. Moderate to advanced facet arthrosis, left greater than right. Resultant moderate canal with bilateral subarticular stenosis. Foramina remain patent. L5-S1: Chronic intervertebral disc space narrowing with diffuse disc bulge. Left greater than right facet degeneration. Resultant moderate to severe left lateral recess narrowing. Mild right with mild to moderate left L5 foraminal stenosis. IMPRESSION: MRI  THORACIC SPINE IMPRESSION 1. 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 Neuro surgical consultation recommended. 2. Superimposed sequelae of prior T1 through T5 fusion for T3 decompression. 3. Stable disc protrusions at T7-8 and T8-9. 4. Prominent facet arthropathy at T12-L1 on the right with  associated reactive marrow edema. MRI LUMBAR SPINE IMPRESSION 1. No evidence for metastatic disease within the lumbar spine. 2. Mild acute to subacute endplate compression deformity involving the left aspect of the superior endplate of L4. This is benign in appearance with no underlying pathologic lesion. 3. Advanced bilateral facet degeneration at L3-4 with associated reactive marrow edema, left greater than right. 4. Multilevel degenerative spondylolysis as above, most notable at L4-5 and L5-S1. Resultant moderate canal stenosis at L4-5, with moderate to severe left lateral recess narrowing at L5-S1. Critical Value/emergent results were called by telephone at the time of interpretation on 09/02/2017 at 8:30 pm to the ER PA [place] , who verbally acknowledged these results. Electronically Signed   By: Jeannine Boga M.D.   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 EXAM: MRI THORACIC AND LUMBAR SPINE WITHOUT AND WITH CONTRAST TECHNIQUE: Multiplanar and multiecho pulse sequences of the thoracic and lumbar spine were obtained without and with intravenous contrast. CONTRAST:  10mL MULTIHANCE GADOBENATE DIMEGLUMINE 529 MG/ML IV SOLN COMPARISON:  Previous MRI from 12/25/2015. FINDINGS: MRI THORACIC SPINE FINDINGS Alignment: Examination mildly limited as the patient was unable to tolerate the  full length of the exam. Postcontrast axial sequences as well as post-contrast imaging through the lumbar spine was not performed. Stable alignment with preservation of the normal thoracic kyphosis. No listhesis. Vertebrae: Postoperative changes from prior fusion from T1 through T5 for T3 decompression again seen. Pathologic fracture deformity of T3 is relatively unchanged in appearance. Interval growth with progressive tumor seen within the adjacent spinal canal, extending from the mid aspect of T2 through the mid aspect of T4 (series 7, image 8). There has been marked interval worsening of cord and thecal sac progression, with the sac compressed laterally and slit like in appearance, measuring approximately 2 mm in transverse diameter at its most narrow point (series 10, image 12). Suspected cord signal abnormality at this level, although poorly visualized due to susceptibility artifact from adjacent hardware. Postoperative fluid collection again noted at the adjacent laminectomy site, likely slightly decreased in size. No other metastatic lesions identified. Reactive marrow edema and enhancement about the right T12-L1 facet favored to be degenerative in nature (series 6, image 4). Vertebral body heights otherwise maintained. Mild discogenic reactive endplate changes present at the thoracolumbar junction. Cord:  No other cord signal abnormality identified. Paraspinal and other soft tissues: No other acute paraspinous soft tissue abnormality. Probable mild cholelithiasis noted. Visualized visceral structures otherwise unremarkable. Disc levels: T7-8: Central disc extrusion flattens the ventral thoracic spinal cord and resultant moderate spinal stenosis, stable. Left paracentral disc protrusion at T8-9 with mild flattening of the left hemi cord also unchanged. MRI LUMBAR SPINE FINDINGS Segmentation: Normal segmentation. Lowest well-formed disc labeled the L5-S1 level. Alignment: Levoscoliosis. Vertebral bodies  otherwise normally aligned with preservation of the normal lumbar lordosis. No listhesis. Vertebrae: Mild height loss with reactive endplate edema at the left aspect of the superior endplate of L4, suspicious for mild compression fracture (series 14, image 12). No bony retropulsion. This is benign in appearance, with no underlying lesion identified. No worrisome osseous lesions or evidence for metastatic disease identified within the lumbar spine. Prominent reactive marrow edema about the left L3-4 facet due to advanced facet degeneration. Conus medullaris: Extends to the L1 level and  appears normal. Paraspinal and other soft tissues: Paraspinous soft tissues within normal limits. Visualized visceral structures unremarkable. Disc levels: L1-2:  Mild facet and ligament flavum hypertrophy.  No stenosis. L2-3: Minimal disc bulge with bilateral facet hypertrophy. No significant stenosis. L3-4: Disc desiccation with minimal disc bulge. Prominent anterior bridging osteophyte. Advanced bilateral facet degeneration with associated reactive marrow edema, left greater than right. 7 mm synovial cyst noted at the posterior aspect of the right L3-4 facet. Mild canal with bilateral L3 foraminal stenosis. L4-5: Chronic intervertebral disc mild diffuse disc bulge. Moderate to advanced facet arthrosis, left greater than right. Resultant moderate canal with bilateral subarticular stenosis. Foramina remain patent. L5-S1: Chronic intervertebral disc space narrowing with diffuse disc bulge. Left greater than right facet degeneration. Resultant moderate to severe left lateral recess narrowing. Mild right with mild to moderate left L5 foraminal stenosis. IMPRESSION: MRI THORACIC SPINE IMPRESSION 1. 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 Neuro surgical consultation recommended. 2. Superimposed sequelae of prior T1 through T5 fusion for T3  decompression. 3. Stable disc protrusions at T7-8 and T8-9. 4. Prominent facet arthropathy at T12-L1 on the right with associated reactive marrow edema. MRI LUMBAR SPINE IMPRESSION 1. No evidence for metastatic disease within the lumbar spine. 2. Mild acute to subacute endplate compression deformity involving the left aspect of the superior endplate of L4. This is benign in appearance with no underlying pathologic lesion. 3. Advanced bilateral facet degeneration at L3-4 with associated reactive marrow edema, left greater than right. 4. Multilevel degenerative spondylolysis as above, most notable at L4-5 and L5-S1. Resultant moderate canal stenosis at L4-5, with moderate to severe left lateral recess narrowing at L5-S1. Critical Value/emergent results were called by telephone at the time of interpretation on 09/02/2017 at 8:30 pm to the ER PA [place] , who verbally acknowledged these results. Electronically Signed   By: Jeannine Boga M.D.   On: 09/02/2017 20:31   Dg C-arm 1-60 Min  Result Date: 09/11/2017 CLINICAL DATA:  Intramedullary femoral nailing. EXAM: DG C-ARM 61-120 MIN; LEFT FEMUR 2 VIEWS FLUOROSCOPY TIME:  1 minutes, 41 seconds. C-arm fluoroscopic images were obtained intraoperatively and submitted for post operative interpretation. COMPARISON:  Left femur x-rays from yesterday. FINDINGS: Multiple intraoperative x-rays demonstrate interval retrograde intramedullary rod placement of the femur with proximal and distal interlocking screws. No hardware complication. Improved alignment of the oblique distal femoral fracture with persistent slight medial displacement. IMPRESSION: Left distal femur fracture fixation with intramedullary rod. Electronically Signed   By: Titus Dubin M.D.   On: 09/11/2017 12:55   Dg C-arm 1-60 Min  Result Date: 09/11/2017 CLINICAL DATA:  Intramedullary femoral nailing. EXAM: DG C-ARM 61-120 MIN; LEFT FEMUR 2 VIEWS FLUOROSCOPY TIME:  1 minutes, 41 seconds.  C-arm fluoroscopic images were obtained intraoperatively and submitted for post operative interpretation. COMPARISON:  Left femur x-rays from yesterday. FINDINGS: Multiple intraoperative x-rays demonstrate interval retrograde intramedullary rod placement of the femur with proximal and distal interlocking screws. No hardware complication. Improved alignment of the oblique distal femoral fracture with persistent slight medial displacement. IMPRESSION: Left distal femur fracture fixation with intramedullary rod. Electronically Signed   By: Titus Dubin M.D.   On: 09/11/2017 12:55   Dg Femur Min 2 Views Left  Result Date: 09/11/2017 CLINICAL DATA:  Intramedullary femoral nailing. EXAM: DG C-ARM 61-120 MIN; LEFT FEMUR 2 VIEWS FLUOROSCOPY TIME:  1 minutes, 41 seconds. C-arm fluoroscopic images were obtained  intraoperatively and submitted for post operative interpretation. COMPARISON:  Left femur x-rays from yesterday. FINDINGS: Multiple intraoperative x-rays demonstrate interval retrograde intramedullary rod placement of the femur with proximal and distal interlocking screws. No hardware complication. Improved alignment of the oblique distal femoral fracture with persistent slight medial displacement. IMPRESSION: Left distal femur fracture fixation with intramedullary rod. Electronically Signed   By: Titus Dubin M.D.   On: 09/11/2017 12:55   Dg Femur Port Min 2 Views Left  Result Date: 09/11/2017 CLINICAL DATA:  Fracture. INTRAMEDULLARY (IM) RETROGRADE FEMORAL NAILING PERFORMED EARLIER TODAY. EXAM: LEFT FEMUR PORTABLE 2 VIEWS COMPARISON:  Sarah [name] of the pelvis on 09/09/2017 FINDINGS: Status post placement of intramedullary nail across oblique distal femur fracture. Cortical screws transfix the proximal and distal aspects. There is deformity of the LEFT inferior pubic ramus consistent with old injury. Degenerative changes are seen at the acetabulum. IMPRESSION: IM nail of the LEFT femur. Electronically Signed    By: Nolon Nations M.D.   On: 09/11/2017 16:45   Dg Femur Port Min 2 Views Left  Result Date: 09/11/2017 CLINICAL DATA:  Status post ORIF of left femoral fracture EXAM: LEFT FEMUR PORTABLE 2 VIEWS COMPARISON:  09/10/2017 FINDINGS: Medullary rod is now noted in place. The distal femoral fracture is again identified in near anatomic alignment. No other focal abnormality is seen. IMPRESSION: Distal femoral fracture with medullary rod in place. Electronically Signed   By: Inez Catalina M.D.   On: 09/11/2017 14:54   Dg Femur Port Min 2 Views Left  Result Date: 09/10/2017 CLINICAL DATA:  52 y/o  F; left femur fracture. EXAM: LEFT FEMUR PORTABLE 2 VIEWS COMPARISON:  09/10/2017 left knee radiographs FINDINGS: Acute oblique extra-articular fracture of the distal left femur metadiaphysis with 1/2 shaft's width lateral displacement of the shaft relative to distal component and mild dorsal apex angulation. Knee joint is maintained. No knee joint effusion. Bones are diffusely demineralized. IMPRESSION: Acute oblique extra-articular fracture of the distal left femur metadiaphysis with 1/2 shaft's width lateral displacement of the shaft relative to distal component and mild dorsal apex angulation. Electronically Signed   By: Kristine Garbe M.D.   On: 09/10/2017 22:37   US Abdomen Limited Ruq  Result Date: 09/16/2017 CLINICAL DATA:  Nausea vomiting for 1 week, history of cholelithiasis EXAM: ULTRASOUND ABDOMEN LIMITED RIGHT UPPER QUADRANT COMPARISON:  08/10/2017 FINDINGS: Gallbladder: Gallbladder is well distended with evidence of cholelithiasis. No gallbladder wall thickening or pericholecystic fluid is noted. Common bile duct: Diameter: 5.5 mm. Liver: Increased coarsened echotexture consistent with fatty infiltration. Portal vein is patent on color Doppler imaging with normal direction of blood flow towards the liver. IMPRESSION: Cholelithiasis without complicating factors. Changes consistent with fatty  infiltration of the liver. Electronically Signed   By: Inez Catalina M.D.   On: 09/16/2017 16:33     CBC Recent Labs  Lab 09/15/17 2328 09/16/17 0742 09/17/17 0539 09/18/17 0612 09/19/17 0434  WBC 11.4* 10.5 8.7 5.2 7.1  HGB 8.4* 8.1* 7.9* 7.0* 8.7*  HCT 26.2* 25.7* 25.1* 22.3* 27.0*  PLT 176 212 194 161 175  MCV 95.6 96.3 96.2 97.8 93.8  MCH 30.7 30.3 30.3 30.7 30.2  MCHC 32.1 31.5 31.5 31.4 32.2  RDW 17.1* 17.3* 17.5* 17.2* 19.3*  LYMPHSABS 0.8  --   --   --   --   MONOABS 0.7  --   --   --   --   EOSABS 0.0  --   --   --   --  BASOSABS 0.0  --   --   --   --     Chemistries  Recent Labs  Lab 09/14/17 0407 09/15/17 1962 09/15/17 2328 09/16/17 0617 09/16/17 1010 09/17/17 0539 09/18/17 0612  NA 136  --  135 135  --  134* 135  K 4.9  --  5.1 4.9  --  5.3* 5.2*  CL 106  --  102 102  --  101 104  CO2 27  --  23 25  --  27 26  GLUCOSE 189*  --  159* 145*  --  118* 125*  BUN 27*  --  34* 31*  --  26* 23*  CREATININE 1.02*  --  1.08* 0.96  --  1.08* 0.94  CALCIUM 7.2* 7.2* 7.7* 7.7*  --  7.6* 7.6*  MG  --   --   --   --  2.2  --   --   AST  --   --  20  --   --  20 18  ALT  --   --  17  --   --  20 15  ALKPHOS  --   --  151*  --   --  137* 132*  BILITOT  --   --  1.4*  --   --  1.6* 1.7*   ------------------------------------------------------------------------------------------------------------------ estimated creatinine clearance is 95.7 mL/min (by C-G formula based on SCr of 0.94 mg/dL). ------------------------------------------------------------------------------------------------------------------ No results for input(s): HGBA1C in the last 72 hours. ------------------------------------------------------------------------------------------------------------------ No results for input(s): CHOL, HDL, LDLCALC, TRIG, CHOLHDL, LDLDIRECT in the last 72  hours. ------------------------------------------------------------------------------------------------------------------ No results for input(s): TSH, T4TOTAL, T3FREE, THYROIDAB in the last 72 hours.  Invalid input(s): FREET3 ------------------------------------------------------------------------------------------------------------------ No results for input(s): VITAMINB12, FOLATE, FERRITIN, TIBC, IRON, RETICCTPCT in the last 72 hours.  Coagulation profile Recent Labs  Lab 09/15/17 2328  INR 1.17    No results for input(s): DDIMER in the last 72 hours.  Cardiac Enzymes Recent Labs  Lab 09/15/17 2328  TROPONINI <0.03   ------------------------------------------------------------------------------------------------------------------ Invalid input(s): POCBNP   CBG: Recent Labs  Lab 09/18/17 2232 09/19/17 0055 09/19/17 0545 09/19/17 0822 09/19/17 1151  GLUCAP 148* 119* 120* 121* 100*       Studies: No results found.    Lab Results  Component Value Date   HGBA1C 5.5 08/09/2017   HGBA1C 5.9 08/05/2016   HGBA1C 5.7 (H) 07/06/2015   Lab Results  Component Value Date   LDLCALC 98 06/07/2015   CREATININE 0.94 09/18/2017       Scheduled Meds: . baclofen  20 mg Oral QHS  . dexamethasone  4 mg Oral Q8H  . famotidine  20 mg Oral BID  . fluticasone  1 spray Each Nare BID  . insulin aspart  0-9 Units Subcutaneous Q4H  . levothyroxine  137 mcg Oral QAC breakfast  . pantoprazole  40 mg Oral Daily  . polyethylene glycol  17 g Oral BID  . rivaroxaban  20 mg Oral Q supper  . senna  2 tablet Oral QHS   Continuous Infusions: . sodium chloride 30 mL/hr at 09/19/17 1100     LOS: 3 days      [facility] Pager [phone]. If 7PM-7AM, please contact night-coverage at [email or web address], password [facility] 09/19/2017, 4:10 PM  LOS: 3 days

## 2017-09-20 LAB — CBC
HEMATOCRIT: 26.6 % — AB (ref 36.0–46.0)
HEMOGLOBIN: 8.4 g/dL — AB (ref 12.0–15.0)
MCH: 30.4 pg (ref 26.0–34.0)
MCHC: 31.6 g/dL (ref 30.0–36.0)
MCV: 96.4 fL (ref 78.0–100.0)
Platelets: 158 10*3/uL (ref 150–400)
RBC: 2.76 MIL/uL — AB (ref 3.87–5.11)
RDW: 19.3 % — ABNORMAL HIGH (ref 11.5–15.5)
WBC: 5.7 10*3/uL (ref 4.0–10.5)

## 2017-09-20 LAB — BASIC METABOLIC PANEL
Anion gap: 5 (ref 5–15)
BUN: 20 mg/dL (ref 6–20)
CHLORIDE: 103 mmol/L (ref 98–111)
CO2: 27 mmol/L (ref 22–32)
CREATININE: 0.83 mg/dL (ref 0.44–1.00)
Calcium: 7.5 mg/dL — ABNORMAL LOW (ref 8.9–10.3)
Glucose, Bld: 109 mg/dL — ABNORMAL HIGH (ref 70–99)
Potassium: 4.3 mmol/L (ref 3.5–5.1)
SODIUM: 135 mmol/L (ref 135–145)

## 2017-09-20 LAB — GLUCOSE, CAPILLARY
GLUCOSE-CAPILLARY: 96 mg/dL (ref 70–99)
GLUCOSE-CAPILLARY: 109 mg/dL — AB (ref 70–99)
Glucose-Capillary: 92 mg/dL (ref 70–99)
Glucose-Capillary: 97 mg/dL (ref 70–99)
Glucose-Capillary: 103 mg/dL — ABNORMAL HIGH (ref 70–99)

## 2017-09-20 MED ORDER — SORBITOL 70 % SOLN
50.0000 mL | Freq: Once | Status: AC
  Administered 2017-09-20: 50 mL via ORAL
  Filled 2017-09-20: qty 60

## 2017-09-20 MED ORDER — LACTULOSE 10 GM/15ML PO SOLN
60.0000 g | Freq: Once | ORAL | Status: AC
  Administered 2017-09-20: 60 g via ORAL
  Filled 2017-09-20: qty 90

## 2017-09-20 MED ORDER — SERTRALINE HCL 25 MG PO TABS
25.0000 mg | ORAL_TABLET | Freq: Every day | ORAL | Status: DC
  Administered 2017-09-20 – 2017-09-21 (×2): 25 mg via ORAL
  Filled 2017-09-20 (×2): qty 1

## 2017-09-20 NOTE — Progress Notes (Signed)
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 Constipation improved, patient also had right upper quadrant pain and had a negative gallbladder ultrasound.    Subjective--- no chest pains, feels constipated,    Assessment and plan  1)Intractable Nausea and Vomiting- clinically and radiologically patient was constipated, improving with laxatives, x-ray of the abdomen shows moderate stool burden , tolerating solid food well ,  no further emesis, c/n Senna nightly and MiraLAX twice daily to maintain regular BMs  2)Metastatic Thyroid Cancer to the spine status post recent resection-- c/n Decadron   p.o. 4 mg   3 times daily   3)H/o PE- c/n Xarelto, please note that HIT was not confirmed, patient has tolerated heparin in the past  4)Postoperative Hypothyroidism--stable, continue Thyroid replacement  5)Lt Femur Fx- s/p ORIF- stable, c/n Baclofen nightly, awaiting transfer to rehab (CIR)  6)Steroid-induced Hyperglycemia--  Use Novolog/Humalog Sliding scale insulin with Accu-Cheks/Fingersticks as ordered   7)Chronic anemia-stable, multifactorial, transfuse as clinically indicated no evidence of ongoing bleeding at this time  8)HTN-  Stable, may use IV Hydralazine 10 mg  Every 4 hours Prn for  systolic blood pressure over 160 mmhg  9)Hypocalcemia--replace calcium as ordered  10)HyperKalemia---improved with Kayexalate   11)Depresion-stable , Zoloft as ordered  DVT prophylaxsis - Xarelto   Code Status:  Full code   Family Communication: Discussed in detail with the patient, all imaging results, lab results explained to the patient   Disposition Plan:    CIR (awaiting insurance approval for rehab)  Antibiotics: Anti-infectives (From admission, onward)   None      Objective: Vitals:   09/19/17 0548 09/19/17 1342 09/19/17 2107 09/20/17 0542  BP: (!) 141/81 (!) 141/81 128/78 117/82  Pulse: 71 72 71 73  Resp: 16 (!) 21 18 16   Temp: 98.1 F (36.7 C) 98.2 F (36.8 C) 98.2 F (36.8 C) 97.7 F (36.5 C)  TempSrc: Oral Oral    SpO2: 100% 100% 100% 100%  Weight:      Height:        Intake/Output Summary (Last 24 hours) at 09/20/2017 1456 Last data filed at 09/20/2017 1258 Gross per 24 hour  Intake 465.23 ml  Output 3200 ml  Net -2734.77 ml    Exam:  Physical Exam  Gen:- Awake Alert,  In no apparent distress  HEENT:- [place].AT, No sclera icterus Neck-Supple Neck,No JVD,. Thyroid scar Lungs-  CTAB , good air movement CV- S1, S2 normal Abd-  +ve B.Sounds, Abd Soft, No tenderness,    Extremity/Skin:- Left foot bruising /echymosis Psych-affect is appropriate, oriented x3 Neuro-no new focal deficits, no tremors     Micro Results No results found for this or any previous visit (from the past 240 hour(s)).  Radiology Reports Dg Shoulder Right  Result Date: 09/02/2017 CLINICAL DATA:  Pain following fall EXAM: RIGHT SHOULDER - 2+  VIEW COMPARISON:  None. FINDINGS: Frontal and Y scapular images were obtained. There is no appreciable fracture or dislocation. There is moderate generalized osteoarthritic change. No erosive change or intra-articular calcification. Visualized right lung is clear. Postoperative changes present in the upper thoracic region. IMPRESSION:  Moderate osteoarthritic change in the right shoulder region. No fracture or dislocation. Electronically Signed   By: Lowella Grip III M.D.   On: 09/02/2017 14:04   Dg Knee 1-2 Views Left  Result Date: 09/10/2017 CLINICAL DATA:  Pain LEFT knee, recent fall on 09/02/2017 EXAM: LEFT KNEE - 1-2 VIEW COMPARISON:  None FINDINGS: Osseous demineralization. Joint spaces preserved. Oblique fracture of the distal LEFT femoral metadiaphysis mildly displaced medially and posteriorly. No additional fracture, dislocation or bone destruction. No knee joint effusion. IMPRESSION: Displaced oblique distal LEFT femoral metadiaphyseal fracture. Findings called to [place] on [facility] on 09/10/2017 at 1317 hours. Electronically Signed   By: Lavonia Dana M.D.   On: 09/10/2017 13:17   Dg Abd 1 View  Result Date: 09/15/2017 CLINICAL DATA:  Abdominal pain, nausea and vomiting EXAM: ABDOMEN - 1 VIEW COMPARISON:  08/09/2017 CT abdomen/pelvis FINDINGS: No dilated small bowel loops. Moderate colorectal stool volume. No evidence of pneumatosis or pneumoperitoneum. No radiopaque nephrolithiasis. Mild levocurvature of the lumbar spine. Marked lumbar spine degenerative changes. Partially visualized surgical fixation hardware in the proximal left femur. IMPRESSION: Nonobstructive bowel gas pattern. Moderate colorectal stool volume, which may indicate constipation. Electronically Signed   By: Ilona Sorrel M.D.   On: 09/15/2017 22:33   Ct Head Wo Contrast  Result Date: 09/02/2017 CLINICAL DATA:  Status post fall moving from a bed to a wheelchair today. Initial encounter. EXAM: CT HEAD WITHOUT CONTRAST CT CERVICAL SPINE WITHOUT CONTRAST TECHNIQUE: Multidetector CT imaging of the head and cervical spine was performed following the standard protocol without intravenous contrast. Multiplanar CT image reconstructions of the cervical spine were also generated. COMPARISON:  Head CT scan 12/22/2016.  PET CT scan 05/20/2017. FINDINGS: CT  HEAD FINDINGS Brain: No evidence of acute infarction, hemorrhage, hydrocephalus, extra-axial collection or mass lesion/mass effect. Vascular: A few atherosclerotic calcifications are seen. Skull: Intact.  No focal lesion. Sinuses/Orbits: Mild mucosal thickening is noted in the maxillary sinuses, frontal sinuses, sphenoid sinus and scattered ethmoid air cells. Other: None. CT CERVICAL SPINE FINDINGS Alignment: Maintained. Skull base and vertebrae: No acute fracture. No primary bone lesion or focal pathologic process. Soft tissues and spinal canal: No prevertebral fluid or swelling. No visible canal hematoma. Disc levels: Intervertebral disc space height is maintained. There is some endplate spurring most notable at C3-4 and C5-6. Partial visualization of thoracic fusion hardware noted. Upper chest: 0.5 cm left upper lobe pulmonary nodule is seen on prior PET CT scan is noted. Other: None. IMPRESSION: No acute abnormality head or cervical spine. Atherosclerosis. Sinus disease. Mild appearing cervical spondylosis. No change in 0.5 cm left upper lobe pulmonary nodule consistent with the patient's known metastatic thyroid carcinoma. Electronically Signed   By: Inge Rise M.D.   On: 09/02/2017 14:37   Ct Cervical Spine Wo Contrast  Result Date: 09/02/2017 CLINICAL DATA:  Status post fall moving from a bed to a wheelchair today. Initial encounter. EXAM: CT HEAD WITHOUT CONTRAST CT CERVICAL SPINE WITHOUT CONTRAST TECHNIQUE: Multidetector CT imaging of the head and cervical spine was performed following the standard protocol without intravenous contrast. Multiplanar CT image reconstructions of the cervical spine were also generated. COMPARISON:  Head CT scan 12/22/2016.  PET CT scan 05/20/2017. FINDINGS:  CT HEAD FINDINGS Brain: No evidence of acute infarction, hemorrhage, hydrocephalus, extra-axial collection or mass lesion/mass effect. Vascular: A few atherosclerotic calcifications are seen. Skull: Intact.  No  focal lesion. Sinuses/Orbits: Mild mucosal thickening is noted in the maxillary sinuses, frontal sinuses, sphenoid sinus and scattered ethmoid air cells. Other: None. CT CERVICAL SPINE FINDINGS Alignment: Maintained. Skull base and vertebrae: No acute fracture. No primary bone lesion or focal pathologic process. Soft tissues and spinal canal: No prevertebral fluid or swelling. No visible canal hematoma. Disc levels: Intervertebral disc space height is maintained. There is some endplate spurring most notable at C3-4 and C5-6. Partial visualization of thoracic fusion hardware noted. Upper chest: 0.5 cm left upper lobe pulmonary nodule is seen on prior PET CT scan is noted. Other: None. IMPRESSION: No acute abnormality head or cervical spine. Atherosclerosis. Sinus disease. Mild appearing cervical spondylosis. No change in 0.5 cm left upper lobe pulmonary nodule consistent with the patient's known metastatic thyroid carcinoma. Electronically Signed   By: Inge Rise M.D.   On: 09/02/2017 14:37   Ct Thoracic Spine Wo Contrast  Result Date: 09/09/2017 CLINICAL DATA:  Neck hematoma, traumatic. Bone neoplasm. Check for local recurrence EXAM: CT THORACIC SPINE WITHOUT CONTRAST TECHNIQUE: Multidetector CT images of the thoracic were obtained using the standard protocol without intravenous contrast. COMPARISON:  Thoracic MRI 09/02/2017.  Thoracic spine CT 09/03/2017 FINDINGS: Alignment: Stable alignment with mild exaggerated thoracic kyphosis. Vertebrae: There is known malignant erosion of the T3, T2, and T4 bodies with chronic T3 collapse. Status post remote laminectomy with more recent reopening for recurrent tumor excision. There is posterior hardware spanning T1-T5, which is well seated. No progressive erosion or interval fracture seen. Paraspinal and other soft tissues: High-density material within the incision, both subcutaneous and deep, measuring up to 16 mm thickness, 8 cm craniocaudal, and 7 cm in depth.  The fluid is high-density compatible with blood products. At the level of the laminectomy, mass effect on the thecal sac would not be detectable by this technique. Degree of residual tumor is also not well established by this technique. Disc levels: Diffuse spondylosis with bridging osteophytes from T4 to T12. There are small calcified disc protrusions at T7-8 and T8-9. IMPRESSION: Interval T3 spinal canal tumor debulking with hemolymphatic material in the subcutaneous and deep incision measuring 40-50 cc. No detected change in osseous structures or posterior hardware. Electronically Signed   By: Monte Fantasia M.D.   On: 09/09/2017 12:51   Ct Thoracic Spine Wo Contrast  Result Date: 09/03/2017 CLINICAL DATA:  Metastatic Hurthle cell carcinoma to the thoracic spine. T3 metastasis with prior decompression. Worsening disease with cord compression on recent MRI. EXAM: CT THORACIC SPINE WITHOUT CONTRAST TECHNIQUE: Multidetector CT images of the thoracic were obtained using the standard protocol without intravenous contrast. COMPARISON:  Thoracic spine MRI 09/02/2017 FINDINGS: Alignment: Mildly exaggerated upper thoracic kyphosis. No listhesis. Vertebrae: Prior posterior fusion from T1-T5. Bilateral pedicle screws at each level except for T3. The screws appear intact without evidence of loosening about the T1, T2, or T5 screws. There is lucency throughout the posterosuperior aspect of the T4 vertebral body including about the distal portions of both T4 screws reflecting tumor as seen on MRI. There is a chronic, severe to pathologic T3 compression fracture. Extensive epidural tumor centered at the T3 level was more fully evaluated on yesterday's MRI. Tumor is again noted to involve the posterior and inferior aspects of the T2 vertebral body. There is slight chronic anterior wedging of the T8  vertebral body. No definite acute fracture is identified. Flowing anterior vertebral osteophytes are present throughout the  thoracic spine extending into the lumbar spine. Paraspinal and other soft tissues: Unremarkable. Disc levels: Spondylosis more fully evaluated on yesterday's MRI, with a small calcified disc extrusion noted at T7-8. IMPRESSION: 1. Metastatic disease as described on yesterday's MRI with extensive osseous and epidural tumor from T2-T4. Chronic pathologic T3 compression fracture. 2. Prior T1-T5 posterior fusion. Tumor involves the T4 vertebral body about the distal aspects of both pedicle screws. Electronically Signed   By: Logan Bores M.D.   On: 09/03/2017 15:13   Ct Lumbar Spine Wo Contrast  Result Date: 09/09/2017 CLINICAL DATA:  Hematoma, neck.  Drop in hematocrit. EXAM: CT LUMBAR SPINE WITHOUT CONTRAST TECHNIQUE: Multidetector CT imaging of the lumbar spine was performed without intravenous contrast administration. Multiplanar CT image reconstructions were also generated. COMPARISON:  09/02/2017 MRI FINDINGS: Segmentation: 5 lumbar type vertebral bodies. Alignment: Mild levoscoliosis. Vertebrae: No evidence of fracture, discitis, or aggressive bone lesion. Paraspinal and other soft tissues: Negative for hematoma. Disc levels: Congenitally narrow spinal canal with bulky spondylitic spurring and generalized degenerative facet hypertrophy throughout the lumbar levels. The spinal canal and foramina are better assessed by recent MRI. IMPRESSION: 1. Negative for hematoma. 2. Bulky spondylosis and facet arthropathy throughout the lumbar spine, with spinal stenosis exacerbated by short pedicles. See MRI from 1 week prior. Electronically Signed   By: Monte Fantasia M.D.   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 EXAM: CT PELVIS WITHOUT CONTRAST TECHNIQUE: Multidetector CT imaging of the pelvis was performed following the standard protocol without intravenous contrast. COMPARISON:  Lumbar spine CT  today reported separately. CT Abdomen and Pelvis 08/09/2017. FINDINGS: Urinary Tract: Foley catheter in place. The urinary bladder is decompressed. The visible ureters are decompressed and unremarkable. Bowel: Retained stool in the rectosigmoid colon similar to the CT last month. Visible large and small bowel loops are nondilated. Vascular/Lymphatic: Vascular patency is not evaluated in the absence of IV contrast. No lymphadenopathy. Reproductive:  Negative. Other: No pelvic free fluid. There is increased nonspecific presacral stranding. See series 4, image 32. Since May there is new widespread nonspecific subcutaneous fat stranding throughout the lateral abdominal wall and proximal lower extremities bilaterally, but greater on the left at the level of the proximal thigh as seen on series 4, image 60. no soft tissue fluid collection in these regions. Musculoskeletal: Bulky degenerative osteophytosis in the visible lower lumbar spine and about the hips, greater on the right. Bone mineralization remains within normal limits. There is no acute, destructive, or suspicious osseous lesion identified. IMPRESSION: 1. Unchanged pelvic osseous structures since May with no acute or suspicious osseous lesion identified. 2. Nonspecific bilateral lateral abdominal wall, presacral, flank, and proximal thigh subcutaneous edema/stranding. The left proximal thigh is asymmetrically more affected as seen on series 4, image 60. Query cellulitis or anasarca. Electronically Signed   By: Genevie Ann M.D.   On: 09/09/2017 21:17   Mr Lumbar Spine Wo Contrast  Result Date: 09/02/2017 CLINICAL DATA:  Initial evaluation for increased lower extremity weakness with history of metastatic Hurthle cell carcinoma to the thoracic spine. T3 metastasis with history of prior decompression. EXAM: MRI THORACIC AND LUMBAR SPINE WITHOUT AND WITH CONTRAST TECHNIQUE: Multiplanar and multiecho pulse sequences of the thoracic and lumbar spine were obtained  without and with intravenous contrast. CONTRAST:  21mL  MULTIHANCE GADOBENATE DIMEGLUMINE 529 MG/ML IV SOLN COMPARISON:  Previous MRI from 12/25/2015. FINDINGS: MRI THORACIC SPINE FINDINGS Alignment: Examination mildly limited as the patient was unable to tolerate the full length of the exam. Postcontrast axial sequences as well as post-contrast imaging through the lumbar spine was not performed. Stable alignment with preservation of the normal thoracic kyphosis. No listhesis. Vertebrae: Postoperative changes from prior fusion from T1 through T5 for T3 decompression again seen. Pathologic fracture deformity of T3 is relatively unchanged in appearance. Interval growth with progressive tumor seen within the adjacent spinal canal, extending from the mid aspect of T2 through the mid aspect of T4 (series 7, image 8). There has been marked interval worsening of cord and thecal sac progression, with the sac compressed laterally and slit like in appearance, measuring approximately 2 mm in transverse diameter at its most narrow point (series 10, image 12). Suspected cord signal abnormality at this level, although poorly visualized due to susceptibility artifact from adjacent hardware. Postoperative fluid collection again noted at the adjacent laminectomy site, likely slightly decreased in size. No other metastatic lesions identified. Reactive marrow edema and enhancement about the right T12-L1 facet favored to be degenerative in nature (series 6, image 4). Vertebral body heights otherwise maintained. Mild discogenic reactive endplate changes present at the thoracolumbar junction. Cord:  No other cord signal abnormality identified. Paraspinal and other soft tissues: No other acute paraspinous soft tissue abnormality. Probable mild cholelithiasis noted. Visualized visceral structures otherwise unremarkable. Disc levels: T7-8: Central disc extrusion flattens the ventral thoracic spinal cord and resultant moderate spinal  stenosis, stable. Left paracentral disc protrusion at T8-9 with mild flattening of the left hemi cord also unchanged. MRI LUMBAR SPINE FINDINGS Segmentation: Normal segmentation. Lowest well-formed disc labeled the L5-S1 level. Alignment: Levoscoliosis. Vertebral bodies otherwise normally aligned with preservation of the normal lumbar lordosis. No listhesis. Vertebrae: Mild height loss with reactive endplate edema at the left aspect of the superior endplate of L4, suspicious for mild compression fracture (series 14, image 12). No bony retropulsion. This is benign in appearance, with no underlying lesion identified. No worrisome osseous lesions or evidence for metastatic disease identified within the lumbar spine. Prominent reactive marrow edema about the left L3-4 facet due to advanced facet degeneration. Conus medullaris: Extends to the L1 level and appears normal. Paraspinal and other soft tissues: Paraspinous soft tissues within normal limits. Visualized visceral structures unremarkable. Disc levels: L1-2:  Mild facet and ligament flavum hypertrophy.  No stenosis. L2-3: Minimal disc bulge with bilateral facet hypertrophy. No significant stenosis. L3-4: Disc desiccation with minimal disc bulge. Prominent anterior bridging osteophyte. Advanced bilateral facet degeneration with associated reactive marrow edema, left greater than right. 7 mm synovial cyst noted at the posterior aspect of the right L3-4 facet. Mild canal with bilateral L3 foraminal stenosis. L4-5: Chronic intervertebral disc mild diffuse disc bulge. Moderate to advanced facet arthrosis, left greater than right. Resultant moderate canal with bilateral subarticular stenosis. Foramina remain patent. L5-S1: Chronic intervertebral disc space narrowing with diffuse disc bulge. Left greater than right facet degeneration. Resultant moderate to severe left lateral recess narrowing. Mild right with mild to moderate left L5 foraminal stenosis. IMPRESSION: MRI  THORACIC SPINE IMPRESSION 1. 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 Neuro surgical consultation recommended. 2. Superimposed sequelae of prior T1 through T5 fusion for T3 decompression. 3. Stable disc protrusions at T7-8 and T8-9. 4. Prominent facet arthropathy at T12-L1 on  the right with associated reactive marrow edema. MRI LUMBAR SPINE IMPRESSION 1. No evidence for metastatic disease within the lumbar spine. 2. Mild acute to subacute endplate compression deformity involving the left aspect of the superior endplate of L4. This is benign in appearance with no underlying pathologic lesion. 3. Advanced bilateral facet degeneration at L3-4 with associated reactive marrow edema, left greater than right. 4. Multilevel degenerative spondylolysis as above, most notable at L4-5 and L5-S1. Resultant moderate canal stenosis at L4-5, with moderate to severe left lateral recess narrowing at L5-S1. Critical Value/emergent results were called by telephone at the time of interpretation on 09/02/2017 at 8:30 pm to the ER PA [place] , who verbally acknowledged these results. Electronically Signed   By: Jeannine Boga M.D.   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 EXAM: MRI THORACIC AND LUMBAR SPINE WITHOUT AND WITH CONTRAST TECHNIQUE: Multiplanar and multiecho pulse sequences of the thoracic and lumbar spine were obtained without and with intravenous contrast. CONTRAST:  69mL MULTIHANCE GADOBENATE DIMEGLUMINE 529 MG/ML IV SOLN COMPARISON:  Previous MRI from 12/25/2015. FINDINGS: MRI THORACIC SPINE FINDINGS Alignment: Examination mildly limited as the patient was unable to tolerate the  full length of the exam. Postcontrast axial sequences as well as post-contrast imaging through the lumbar spine was not performed. Stable alignment with preservation of the normal thoracic kyphosis. No listhesis. Vertebrae: Postoperative changes from prior fusion from T1 through T5 for T3 decompression again seen. Pathologic fracture deformity of T3 is relatively unchanged in appearance. Interval growth with progressive tumor seen within the adjacent spinal canal, extending from the mid aspect of T2 through the mid aspect of T4 (series 7, image 8). There has been marked interval worsening of cord and thecal sac progression, with the sac compressed laterally and slit like in appearance, measuring approximately 2 mm in transverse diameter at its most narrow point (series 10, image 12). Suspected cord signal abnormality at this level, although poorly visualized due to susceptibility artifact from adjacent hardware. Postoperative fluid collection again noted at the adjacent laminectomy site, likely slightly decreased in size. No other metastatic lesions identified. Reactive marrow edema and enhancement about the right T12-L1 facet favored to be degenerative in nature (series 6, image 4). Vertebral body heights otherwise maintained. Mild discogenic reactive endplate changes present at the thoracolumbar junction. Cord:  No other cord signal abnormality identified. Paraspinal and other soft tissues: No other acute paraspinous soft tissue abnormality. Probable mild cholelithiasis noted. Visualized visceral structures otherwise unremarkable. Disc levels: T7-8: Central disc extrusion flattens the ventral thoracic spinal cord and resultant moderate spinal stenosis, stable. Left paracentral disc protrusion at T8-9 with mild flattening of the left hemi cord also unchanged. MRI LUMBAR SPINE FINDINGS Segmentation: Normal segmentation. Lowest well-formed disc labeled the L5-S1 level. Alignment: Levoscoliosis. Vertebral bodies  otherwise normally aligned with preservation of the normal lumbar lordosis. No listhesis. Vertebrae: Mild height loss with reactive endplate edema at the left aspect of the superior endplate of L4, suspicious for mild compression fracture (series 14, image 12). No bony retropulsion. This is benign in appearance, with no underlying lesion identified. No worrisome osseous lesions or evidence for metastatic disease identified within the lumbar spine. Prominent reactive marrow edema about the left L3-4 facet due to advanced facet degeneration. Conus medullaris: Extends to the  L1 level and appears normal. Paraspinal and other soft tissues: Paraspinous soft tissues within normal limits. Visualized visceral structures unremarkable. Disc levels: L1-2:  Mild facet and ligament flavum hypertrophy.  No stenosis. L2-3: Minimal disc bulge with bilateral facet hypertrophy. No significant stenosis. L3-4: Disc desiccation with minimal disc bulge. Prominent anterior bridging osteophyte. Advanced bilateral facet degeneration with associated reactive marrow edema, left greater than right. 7 mm synovial cyst noted at the posterior aspect of the right L3-4 facet. Mild canal with bilateral L3 foraminal stenosis. L4-5: Chronic intervertebral disc mild diffuse disc bulge. Moderate to advanced facet arthrosis, left greater than right. Resultant moderate canal with bilateral subarticular stenosis. Foramina remain patent. L5-S1: Chronic intervertebral disc space narrowing with diffuse disc bulge. Left greater than right facet degeneration. Resultant moderate to severe left lateral recess narrowing. Mild right with mild to moderate left L5 foraminal stenosis. IMPRESSION: MRI THORACIC SPINE IMPRESSION 1. 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 Neuro surgical consultation recommended. 2. Superimposed sequelae of prior T1 through T5 fusion for T3  decompression. 3. Stable disc protrusions at T7-8 and T8-9. 4. Prominent facet arthropathy at T12-L1 on the right with associated reactive marrow edema. MRI LUMBAR SPINE IMPRESSION 1. No evidence for metastatic disease within the lumbar spine. 2. Mild acute to subacute endplate compression deformity involving the left aspect of the superior endplate of L4. This is benign in appearance with no underlying pathologic lesion. 3. Advanced bilateral facet degeneration at L3-4 with associated reactive marrow edema, left greater than right. 4. Multilevel degenerative spondylolysis as above, most notable at L4-5 and L5-S1. Resultant moderate canal stenosis at L4-5, with moderate to severe left lateral recess narrowing at L5-S1. Critical Value/emergent results were called by telephone at the time of interpretation on 09/02/2017 at 8:30 pm to the ER PA [place] , who verbally acknowledged these results. Electronically Signed   By: Jeannine Boga M.D.   On: 09/02/2017 20:31   Dg C-arm 1-60 Min  Result Date: 09/11/2017 CLINICAL DATA:  Intramedullary femoral nailing. EXAM: DG C-ARM 61-120 MIN; LEFT FEMUR 2 VIEWS FLUOROSCOPY TIME:  1 minutes, 41 seconds. C-arm fluoroscopic images were obtained intraoperatively and submitted for post operative interpretation. COMPARISON:  Left femur x-rays from yesterday. FINDINGS: Multiple intraoperative x-rays demonstrate interval retrograde intramedullary rod placement of the femur with proximal and distal interlocking screws. No hardware complication. Improved alignment of the oblique distal femoral fracture with persistent slight medial displacement. IMPRESSION: Left distal femur fracture fixation with intramedullary rod. Electronically Signed   By: Titus Dubin M.D.   On: 09/11/2017 12:55   Dg C-arm 1-60 Min  Result Date: 09/11/2017 CLINICAL DATA:  Intramedullary femoral nailing. EXAM: DG C-ARM 61-120 MIN; LEFT FEMUR 2 VIEWS FLUOROSCOPY TIME:  1 minutes, 41 seconds.  C-arm fluoroscopic images were obtained intraoperatively and submitted for post operative interpretation. COMPARISON:  Left femur x-rays from yesterday. FINDINGS: Multiple intraoperative x-rays demonstrate interval retrograde intramedullary rod placement of the femur with proximal and distal interlocking screws. No hardware complication. Improved alignment of the oblique distal femoral fracture with persistent slight medial displacement. IMPRESSION: Left distal femur fracture fixation with intramedullary rod. Electronically Signed   By: Titus Dubin M.D.   On: 09/11/2017 12:55   Dg Femur Min 2 Views Left  Result Date: 09/11/2017 CLINICAL DATA:  Intramedullary femoral nailing. EXAM: DG C-ARM 61-120 MIN; LEFT FEMUR 2 VIEWS FLUOROSCOPY TIME:  1 minutes, 41 seconds. C-arm fluoroscopic  images were obtained intraoperatively and submitted for post operative interpretation. COMPARISON:  Left femur x-rays from yesterday. FINDINGS: Multiple intraoperative x-rays demonstrate interval retrograde intramedullary rod placement of the femur with proximal and distal interlocking screws. No hardware complication. Improved alignment of the oblique distal femoral fracture with persistent slight medial displacement. IMPRESSION: Left distal femur fracture fixation with intramedullary rod. Electronically Signed   By: Titus Dubin M.D.   On: 09/11/2017 12:55   Dg Femur Port Min 2 Views Left  Result Date: 09/11/2017 CLINICAL DATA:  Fracture. INTRAMEDULLARY (IM) RETROGRADE FEMORAL NAILING PERFORMED EARLIER TODAY. EXAM: LEFT FEMUR PORTABLE 2 VIEWS COMPARISON:  CT of the pelvis on 09/09/2017 FINDINGS: Status post placement of intramedullary nail across oblique distal femur fracture. Cortical screws transfix the proximal and distal aspects. There is deformity of the LEFT inferior pubic ramus consistent with old injury. Degenerative changes are seen at the acetabulum. IMPRESSION: IM nail of the LEFT femur. Electronically Signed    By: Nolon Nations M.D.   On: 09/11/2017 16:45   Dg Femur Port Min 2 Views Left  Result Date: 09/11/2017 CLINICAL DATA:  Status post ORIF of left femoral fracture EXAM: LEFT FEMUR PORTABLE 2 VIEWS COMPARISON:  09/10/2017 FINDINGS: Medullary rod is now noted in place. The distal femoral fracture is again identified in near anatomic alignment. No other focal abnormality is seen. IMPRESSION: Distal femoral fracture with medullary rod in place. Electronically Signed   By: Inez Catalina M.D.   On: 09/11/2017 14:54   Dg Femur Port Min 2 Views Left  Result Date: 09/10/2017 CLINICAL DATA:  52 y/o  F; left femur fracture. EXAM: LEFT FEMUR PORTABLE 2 VIEWS COMPARISON:  09/10/2017 left knee radiographs FINDINGS: Acute oblique extra-articular fracture of the distal left femur metadiaphysis with 1/2 shaft's width lateral displacement of the shaft relative to distal component and mild dorsal apex angulation. Knee joint is maintained. No knee joint effusion. Bones are diffusely demineralized. IMPRESSION: Acute oblique extra-articular fracture of the distal left femur metadiaphysis with 1/2 shaft's width lateral displacement of the shaft relative to distal component and mild dorsal apex angulation. Electronically Signed   By: Kristine Garbe M.D.   On: 09/10/2017 22:37   US Abdomen Limited Ruq  Result Date: 09/16/2017 CLINICAL DATA:  Nausea vomiting for 1 week, history of cholelithiasis EXAM: ULTRASOUND ABDOMEN LIMITED RIGHT UPPER QUADRANT COMPARISON:  08/10/2017 FINDINGS: Gallbladder: Gallbladder is well distended with evidence of cholelithiasis. No gallbladder wall thickening or pericholecystic fluid is noted. Common bile duct: Diameter: 5.5 mm. Liver: Increased coarsened echotexture consistent with fatty infiltration. Portal vein is patent on color Doppler imaging with normal direction of blood flow towards the liver. IMPRESSION: Cholelithiasis without complicating factors. Changes consistent with fatty  infiltration of the liver. Electronically Signed   By: Inez Catalina M.D.   On: 09/16/2017 16:33     CBC Recent Labs  Lab 09/15/17 2328 09/16/17 0742 09/17/17 0539 09/18/17 0612 09/19/17 0434 09/20/17 0421  WBC 11.4* 10.5 8.7 5.2 7.1 5.7  HGB 8.4* 8.1* 7.9* 7.0* 8.7* 8.4*  HCT 26.2* 25.7* 25.1* 22.3* 27.0* 26.6*  PLT 176 212 194 161 175 158  MCV 95.6 96.3 96.2 97.8 93.8 96.4  MCH 30.7 30.3 30.3 30.7 30.2 30.4  MCHC 32.1 31.5 31.5 31.4 32.2 31.6  RDW 17.1* 17.3* 17.5* 17.2* 19.3* 19.3*  LYMPHSABS 0.8  --   --   --   --   --   MONOABS 0.7  --   --   --   --   --  EOSABS 0.0  --   --   --   --   --   BASOSABS 0.0  --   --   --   --   --     Chemistries  Recent Labs  Lab 09/15/17 2328 09/16/17 0617 09/16/17 1010 09/17/17 0539 09/18/17 0612 09/20/17 0421  NA 135 135  --  134* 135 135  K 5.1 4.9  --  5.3* 5.2* 4.3  CL 102 102  --  101 104 103  CO2 23 25  --  27 26 27   GLUCOSE 159* 145*  --  118* 125* 109*  BUN 34* 31*  --  26* 23* 20  CREATININE 1.08* 0.96  --  1.08* 0.94 0.83  CALCIUM 7.7* 7.7*  --  7.6* 7.6* 7.5*  MG  --   --  2.2  --   --   --   AST 20  --   --  20 18  --   ALT 17  --   --  20 15  --   ALKPHOS 151*  --   --  137* 132*  --   BILITOT 1.4*  --   --  1.6* 1.7*  --    ------------------------------------------------------------------------------------------------------------------ estimated creatinine clearance is 108.4 mL/min (by C-G formula based on SCr of 0.83 mg/dL). ------------------------------------------------------------------------------------------------------------------ No results for input(s): HGBA1C in the last 72 hours. ------------------------------------------------------------------------------------------------------------------ No results for input(s): CHOL, HDL, LDLCALC, TRIG, CHOLHDL, LDLDIRECT in the last 72  hours. ------------------------------------------------------------------------------------------------------------------ No results for input(s): TSH, T4TOTAL, T3FREE, THYROIDAB in the last 72 hours.  Invalid input(s): FREET3 ------------------------------------------------------------------------------------------------------------------ No results for input(s): VITAMINB12, FOLATE, FERRITIN, TIBC, IRON, RETICCTPCT in the last 72 hours.  Coagulation profile Recent Labs  Lab 09/15/17 2328  INR 1.17    No results for input(s): DDIMER in the last 72 hours.  Cardiac Enzymes Recent Labs  Lab 09/15/17 2328  TROPONINI <0.03   ------------------------------------------------------------------------------------------------------------------ Invalid input(s): POCBNP   CBG: Recent Labs  Lab 09/19/17 2110 09/19/17 2326 09/20/17 0700 09/20/17 0817 09/20/17 1248  GLUCAP 140* 105* 96 92 97    Studies: No results found.    Lab Results  Component Value Date   HGBA1C 5.5 08/09/2017   HGBA1C 5.9 08/05/2016   HGBA1C 5.7 (H) 07/06/2015   Lab Results  Component Value Date   LDLCALC 98 06/07/2015   CREATININE 0.83 09/20/2017    Scheduled Meds: . baclofen  20 mg Oral QHS  . dexamethasone  4 mg Oral Q8H  . famotidine  20 mg Oral BID  . fluticasone  1 spray Each Nare BID  . insulin aspart  0-9 Units Subcutaneous Q4H  . levothyroxine  137 mcg Oral QAC breakfast  . pantoprazole  40 mg Oral Daily  . polyethylene glycol  17 g Oral BID  . rivaroxaban  20 mg Oral Q supper  . senna  2 tablet Oral QHS  . sertraline  25 mg Oral Daily   Continuous Infusions: . sodium chloride 30 mL/hr at 09/19/17 2000     LOS: 4 days   [facility] Pager [phone]. If 7PM-7AM, please contact night-coverage at [email or web address], password [facility] 09/20/2017, 2:56 PM  LOS: 4 days

## 2017-09-20 NOTE — Consult Note (Signed)
[facility] Nurse wound consult note Reason for Consult: Upper back (thoracic)  Incision line Wound type: surgical Pressure Injury POA: Yes/No/NA Measurement:11cm x 0.4cm x 0.2cm  Wound VDI:XVEZ, wet Drainage (amount, consistency, odor) No fresh exudate noted Periwound: Dried serosanguinous exudate beneath old dressing Dressing procedure/placement/frequency: I will implement a conservative POC for the area, using an antimicrobial astringent dressing (xeroform) after cleansing and with daily changes.  [facility] nursing team will not follow, but will remain available to this patient, the nursing and medical teams.  Please re-consult if needed. Thanks, Maudie Flakes, MSN, RN, [place], [facility]  Pager# [phone]

## 2017-09-21 ENCOUNTER — Inpatient Hospital Stay (HOSPITAL_COMMUNITY)
Admission: RE | Admit: 2017-09-21 | Discharge: 2017-10-30 | DRG: 052 | Disposition: A | Discharge status: Skilled Nursing Facility | Payer: Medicare Other | Source: Intra-hospital | Attending: Physical Medicine & Rehabilitation | Admitting: Physical Medicine & Rehabilitation

## 2017-09-21 ENCOUNTER — Encounter (HOSPITAL_COMMUNITY): Payer: Self-pay | Admitting: *Deleted

## 2017-09-21 DIAGNOSIS — C73 Malignant neoplasm of thyroid gland: Secondary | ICD-10-CM | POA: Diagnosis not present

## 2017-09-21 DIAGNOSIS — N319 Neuromuscular dysfunction of bladder, unspecified: Secondary | ICD-10-CM | POA: Diagnosis not present

## 2017-09-21 DIAGNOSIS — Z743 Need for continuous supervision: Secondary | ICD-10-CM | POA: Diagnosis not present

## 2017-09-21 DIAGNOSIS — R4189 Other symptoms and signs involving cognitive functions and awareness: Secondary | ICD-10-CM | POA: Diagnosis present

## 2017-09-21 DIAGNOSIS — Z79899 Other long term (current) drug therapy: Secondary | ICD-10-CM

## 2017-09-21 DIAGNOSIS — T380X5A Adverse effect of glucocorticoids and synthetic analogues, initial encounter: Secondary | ICD-10-CM | POA: Diagnosis present

## 2017-09-21 DIAGNOSIS — M62838 Other muscle spasm: Secondary | ICD-10-CM

## 2017-09-21 DIAGNOSIS — Z86711 Personal history of pulmonary embolism: Secondary | ICD-10-CM

## 2017-09-21 DIAGNOSIS — G9589 Other specified diseases of spinal cord: Secondary | ICD-10-CM | POA: Diagnosis not present

## 2017-09-21 DIAGNOSIS — N390 Urinary tract infection, site not specified: Secondary | ICD-10-CM | POA: Diagnosis present

## 2017-09-21 DIAGNOSIS — C7951 Secondary malignant neoplasm of bone: Secondary | ICD-10-CM

## 2017-09-21 DIAGNOSIS — Z923 Personal history of irradiation: Secondary | ICD-10-CM | POA: Diagnosis not present

## 2017-09-21 DIAGNOSIS — Z7901 Long term (current) use of anticoagulants: Secondary | ICD-10-CM | POA: Diagnosis not present

## 2017-09-21 DIAGNOSIS — G9529 Other cord compression: Secondary | ICD-10-CM

## 2017-09-21 DIAGNOSIS — G992 Myelopathy in diseases classified elsewhere: Secondary | ICD-10-CM | POA: Diagnosis not present

## 2017-09-21 DIAGNOSIS — E099 Drug or chemical induced diabetes mellitus without complications: Secondary | ICD-10-CM | POA: Diagnosis not present

## 2017-09-21 DIAGNOSIS — Z4789 Encounter for other orthopedic aftercare: Secondary | ICD-10-CM | POA: Diagnosis not present

## 2017-09-21 DIAGNOSIS — R739 Hyperglycemia, unspecified: Secondary | ICD-10-CM | POA: Diagnosis not present

## 2017-09-21 DIAGNOSIS — E890 Postprocedural hypothyroidism: Secondary | ICD-10-CM | POA: Diagnosis present

## 2017-09-21 DIAGNOSIS — T148XXA Other injury of unspecified body region, initial encounter: Secondary | ICD-10-CM | POA: Diagnosis not present

## 2017-09-21 DIAGNOSIS — G8220 Paraplegia, unspecified: Secondary | ICD-10-CM | POA: Diagnosis not present

## 2017-09-21 DIAGNOSIS — R112 Nausea with vomiting, unspecified: Secondary | ICD-10-CM

## 2017-09-21 DIAGNOSIS — B370 Candidal stomatitis: Secondary | ICD-10-CM | POA: Diagnosis not present

## 2017-09-21 DIAGNOSIS — Z510 Encounter for antineoplastic radiation therapy: Secondary | ICD-10-CM | POA: Diagnosis not present

## 2017-09-21 DIAGNOSIS — M6281 Muscle weakness (generalized): Secondary | ICD-10-CM | POA: Diagnosis not present

## 2017-09-21 DIAGNOSIS — D702 Other drug-induced agranulocytosis: Secondary | ICD-10-CM | POA: Diagnosis not present

## 2017-09-21 DIAGNOSIS — G8100 Flaccid hemiplegia affecting unspecified side: Secondary | ICD-10-CM | POA: Diagnosis not present

## 2017-09-21 DIAGNOSIS — B379 Candidiasis, unspecified: Secondary | ICD-10-CM | POA: Diagnosis not present

## 2017-09-21 DIAGNOSIS — F329 Major depressive disorder, single episode, unspecified: Secondary | ICD-10-CM

## 2017-09-21 DIAGNOSIS — Z85850 Personal history of malignant neoplasm of thyroid: Secondary | ICD-10-CM | POA: Diagnosis not present

## 2017-09-21 DIAGNOSIS — I10 Essential (primary) hypertension: Secondary | ICD-10-CM | POA: Diagnosis not present

## 2017-09-21 DIAGNOSIS — S72402A Unspecified fracture of lower end of left femur, initial encounter for closed fracture: Secondary | ICD-10-CM

## 2017-09-21 DIAGNOSIS — R1310 Dysphagia, unspecified: Secondary | ICD-10-CM | POA: Diagnosis not present

## 2017-09-21 DIAGNOSIS — S72309D Unspecified fracture of shaft of unspecified femur, subsequent encounter for closed fracture with routine healing: Secondary | ICD-10-CM | POA: Diagnosis not present

## 2017-09-21 DIAGNOSIS — D696 Thrombocytopenia, unspecified: Secondary | ICD-10-CM | POA: Diagnosis not present

## 2017-09-21 DIAGNOSIS — K592 Neurogenic bowel, not elsewhere classified: Secondary | ICD-10-CM

## 2017-09-21 DIAGNOSIS — Z833 Family history of diabetes mellitus: Secondary | ICD-10-CM | POA: Diagnosis not present

## 2017-09-21 DIAGNOSIS — Z79890 Hormone replacement therapy (postmenopausal): Secondary | ICD-10-CM

## 2017-09-21 DIAGNOSIS — D492 Neoplasm of unspecified behavior of bone, soft tissue, and skin: Secondary | ICD-10-CM | POA: Diagnosis not present

## 2017-09-21 DIAGNOSIS — G8221 Paraplegia, complete: Secondary | ICD-10-CM | POA: Diagnosis not present

## 2017-09-21 DIAGNOSIS — G959 Disease of spinal cord, unspecified: Secondary | ICD-10-CM | POA: Diagnosis present

## 2017-09-21 DIAGNOSIS — H6691 Otitis media, unspecified, right ear: Secondary | ICD-10-CM | POA: Diagnosis not present

## 2017-09-21 DIAGNOSIS — S72302A Unspecified fracture of shaft of left femur, initial encounter for closed fracture: Secondary | ICD-10-CM

## 2017-09-21 DIAGNOSIS — D62 Acute posthemorrhagic anemia: Secondary | ICD-10-CM

## 2017-09-21 DIAGNOSIS — C7931 Secondary malignant neoplasm of brain: Secondary | ICD-10-CM | POA: Diagnosis not present

## 2017-09-21 DIAGNOSIS — D500 Iron deficiency anemia secondary to blood loss (chronic): Secondary | ICD-10-CM | POA: Diagnosis not present

## 2017-09-21 DIAGNOSIS — B9620 Unspecified Escherichia coli [E. coli] as the cause of diseases classified elsewhere: Secondary | ICD-10-CM | POA: Diagnosis present

## 2017-09-21 DIAGNOSIS — D61818 Other pancytopenia: Secondary | ICD-10-CM | POA: Diagnosis not present

## 2017-09-21 DIAGNOSIS — K5900 Constipation, unspecified: Secondary | ICD-10-CM | POA: Diagnosis present

## 2017-09-21 DIAGNOSIS — R52 Pain, unspecified: Secondary | ICD-10-CM | POA: Diagnosis not present

## 2017-09-21 DIAGNOSIS — S72302S Unspecified fracture of shaft of left femur, sequela: Secondary | ICD-10-CM

## 2017-09-21 DIAGNOSIS — H6690 Otitis media, unspecified, unspecified ear: Secondary | ICD-10-CM | POA: Diagnosis present

## 2017-09-21 DIAGNOSIS — E039 Hypothyroidism, unspecified: Secondary | ICD-10-CM

## 2017-09-21 DIAGNOSIS — M4804 Spinal stenosis, thoracic region: Secondary | ICD-10-CM | POA: Diagnosis not present

## 2017-09-21 DIAGNOSIS — R279 Unspecified lack of coordination: Secondary | ICD-10-CM | POA: Diagnosis not present

## 2017-09-21 DIAGNOSIS — Z993 Dependence on wheelchair: Secondary | ICD-10-CM

## 2017-09-21 LAB — GLUCOSE, CAPILLARY
GLUCOSE-CAPILLARY: 101 mg/dL — AB (ref 70–99)
GLUCOSE-CAPILLARY: 107 mg/dL — AB (ref 70–99)
GLUCOSE-CAPILLARY: 108 mg/dL — AB (ref 70–99)
GLUCOSE-CAPILLARY: 112 mg/dL — AB (ref 70–99)
GLUCOSE-CAPILLARY: 207 mg/dL — AB (ref 70–99)
Glucose-Capillary: 114 mg/dL — ABNORMAL HIGH (ref 70–99)
Glucose-Capillary: 119 mg/dL — ABNORMAL HIGH (ref 70–99)
Glucose-Capillary: 115 mg/dL — ABNORMAL HIGH (ref 70–99)
Glucose-Capillary: 125 mg/dL — ABNORMAL HIGH (ref 70–99)

## 2017-09-21 MED ORDER — FLUTICASONE PROPIONATE 50 MCG/ACT NA SUSP
1.0000 | Freq: Two times a day (BID) | NASAL | Status: DC
  Administered 2017-09-21 – 2017-09-22 (×2): 1 via NASAL

## 2017-09-21 MED ORDER — INSULIN ASPART 100 UNIT/ML ~~LOC~~ SOLN: 0.0000 [IU] | SUBCUTANEOUS | 11 refills | Status: DC

## 2017-09-21 MED ORDER — SENNA 8.6 MG PO TABS
2.0000 | ORAL_TABLET | Freq: Every day | ORAL | Status: DC
  Administered 2017-09-21 – 2017-09-25 (×5): 17.2 mg via ORAL
  Filled 2017-09-21 (×5): qty 2

## 2017-09-21 MED ORDER — DEXAMETHASONE 4 MG PO TABS
4.0000 mg | ORAL_TABLET | Freq: Three times a day (TID) | ORAL | Status: DC
  Administered 2017-09-21 – 2017-09-25 (×11): 4 mg via ORAL
  Filled 2017-09-21 (×11): qty 1

## 2017-09-21 MED ORDER — ACETAMINOPHEN 325 MG PO TABS
650.0000 mg | ORAL_TABLET | Freq: Four times a day (QID) | ORAL | Status: DC | PRN
  Administered 2017-09-22 – 2017-10-04 (×23): 650 mg via ORAL
  Administered 2017-10-05: 325 mg via ORAL
  Administered 2017-10-05 – 2017-10-30 (×48): 650 mg via ORAL
  Filled 2017-09-21 (×76): qty 2

## 2017-09-21 MED ORDER — ONDANSETRON HCL 4 MG PO TABS
4.0000 mg | ORAL_TABLET | Freq: Four times a day (QID) | ORAL | Status: DC | PRN
  Administered 2017-10-05 – 2017-10-29 (×14): 4 mg via ORAL
  Filled 2017-09-21 (×15): qty 1

## 2017-09-21 MED ORDER — BACLOFEN 20 MG PO TABS
20.0000 mg | ORAL_TABLET | Freq: Every day | ORAL | Status: DC
  Administered 2017-09-21 – 2017-10-11 (×20): 20 mg via ORAL
  Filled 2017-09-21 (×23): qty 1

## 2017-09-21 MED ORDER — DEXAMETHASONE 4 MG PO TABS: 4.0000 mg | ORAL_TABLET | Freq: Two times a day (BID) | ORAL | Status: DC

## 2017-09-21 MED ORDER — FAMOTIDINE 20 MG PO TABS: 20.0000 mg | ORAL_TABLET | Freq: Two times a day (BID) | ORAL | 0 refills | Status: DC

## 2017-09-21 MED ORDER — RIVAROXABAN 20 MG PO TABS
20.0000 mg | ORAL_TABLET | Freq: Every day | ORAL | Status: DC
  Administered 2017-09-21 – 2017-10-29 (×36): 20 mg via ORAL
  Filled 2017-09-21 (×39): qty 1

## 2017-09-21 MED ORDER — SERTRALINE HCL 50 MG PO TABS
25.0000 mg | ORAL_TABLET | Freq: Every day | ORAL | Status: DC
  Administered 2017-09-22 – 2017-10-30 (×36): 25 mg via ORAL
  Filled 2017-09-21 (×39): qty 1

## 2017-09-21 MED ORDER — BACLOFEN 20 MG PO TABS: 20.0000 mg | ORAL_TABLET | Freq: Every day | ORAL | 0 refills | Status: DC

## 2017-09-21 MED ORDER — SENNA 8.6 MG PO TABS: 2.0000 | ORAL_TABLET | Freq: Every day | ORAL | 0 refills | Status: DC

## 2017-09-21 MED ORDER — LEVOTHYROXINE SODIUM 137 MCG PO TABS
137.0000 ug | ORAL_TABLET | Freq: Every day | ORAL | Status: DC
  Administered 2017-09-22 – 2017-10-28 (×34): 137 ug via ORAL
  Filled 2017-09-21 (×40): qty 1

## 2017-09-21 MED ORDER — POLYETHYLENE GLYCOL 3350 17 G PO PACK
17.0000 g | PACK | Freq: Two times a day (BID) | ORAL | Status: DC
  Administered 2017-09-21 – 2017-09-29 (×15): 17 g via ORAL
  Filled 2017-09-21 (×18): qty 1

## 2017-09-21 MED ORDER — ONDANSETRON HCL 4 MG PO TABS: 4.0000 mg | ORAL_TABLET | Freq: Four times a day (QID) | ORAL | 0 refills | Status: DC | PRN

## 2017-09-21 MED ORDER — ONDANSETRON HCL 4 MG/2ML IJ SOLN
4.0000 mg | Freq: Four times a day (QID) | INTRAMUSCULAR | Status: DC | PRN
Start: 2017-09-21 — End: 2017-10-30
  Administered 2017-10-26 – 2017-10-28 (×2): 4 mg via INTRAVENOUS
  Filled 2017-09-21: qty 2

## 2017-09-21 MED ORDER — INSULIN ASPART 100 UNIT/ML ~~LOC~~ SOLN
0.0000 [IU] | SUBCUTANEOUS | Status: DC
  Administered 2017-09-22: 1 [IU] via SUBCUTANEOUS

## 2017-09-21 MED ORDER — SORBITOL 70 % SOLN
30.0000 mL | Freq: Every day | Status: DC | PRN
  Filled 2017-09-21 (×2): qty 30

## 2017-09-21 MED ORDER — PANTOPRAZOLE SODIUM 40 MG PO TBEC
40.0000 mg | DELAYED_RELEASE_TABLET | Freq: Every day | ORAL | Status: DC
  Administered 2017-09-22 – 2017-10-29 (×38): 40 mg via ORAL
  Filled 2017-09-21 (×39): qty 1

## 2017-09-21 MED ORDER — FAMOTIDINE 20 MG PO TABS
20.0000 mg | ORAL_TABLET | Freq: Two times a day (BID) | ORAL | Status: DC
  Administered 2017-09-21 – 2017-10-30 (×76): 20 mg via ORAL
  Filled 2017-09-21 (×77): qty 1

## 2017-09-21 MED ORDER — MORPHINE SULFATE (PF) 2 MG/ML IV SOLN
2.0000 mg | Freq: Once | INTRAVENOUS | Status: AC
  Administered 2017-09-21: 2 mg via INTRAVENOUS
  Filled 2017-09-21: qty 1

## 2017-09-21 MED ORDER — POLYETHYLENE GLYCOL 3350 17 G PO PACK: 17.0000 g | PACK | Freq: Two times a day (BID) | ORAL | 0 refills | Status: DC

## 2017-09-21 NOTE — Discharge Instructions (Signed)
1)Please  recheck CBC on 09/24/2017 and transfuse as clinically indicated  2)Please call Dr. Marcelino Scot the orthopedic surgeon to discuss  when patient right lower extremity sutures can come out,   3) continue Accu-Cheks with sliding scale insulin coverage AC AND HS as long as patient is on Decadron

## 2017-09-21 NOTE — Progress Notes (Signed)
[name], MD  Physician  Physical Medicine and Rehabilitation  Consult Note  Signed  Date of Service:  09/07/2017 6:14 AM       Related encounter: ED to Hosp-Admission (Discharged) from 09/02/2017 in [place]      Signed      Expand All Collapse All      Show:Clear all [x] Manual[x] Template[] Copied  Added by: [x] Angiulli, Lavon Paganini, PA-C[x] [name], MD   [] Hover for details        Physical Medicine and Rehabilitation Consult Reason for Consult: Decreased functional mobility Referring Physician: Dr. Christella Noa   HPI: Sarah [name] is a 52 y.o. 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  Per chart review patient lives with sister and brother-in-law.  One level home with ramped entrance.  Up until recently she was using a sliding board for transfers and ambulating short distances with a rolling walker up until May 2019.  She had been using a  wheelchair most of the time for the past 3 years.  Patient has had 2 previous back surgeries at T3 in 2015 and 2017.  Patient with recent admission approximately 2-1/2 weeks ago for nausea and vomiting was later discharged to home.  Presented 09/02/2017 with recent fall and increasing leg weakness over the past 2 weeks.  Patient initially was to see her oncologist 08/24/2017 for MRI that was planned however could not make the appointment due to lower extremity weakness.  She did report some urinary incontinence.  MRI and imaging revealed tumor recurrence at T3 with severe spinal cord compression.  Underwent posterior spinal tumor resection T3 09/03/2017 per Dr. Christella Noa.  Hospital course pain management.  Radiation oncology follow-up Dr. Dara Lords await possible plan for need for radiation therapy.  Decadron protocol as indicated.  Physical and occupational therapy evaluations completed  with recommendations of physical medicine rehab consult.   Review of Systems  Constitutional: Negative for fever.  HENT: Negative for hearing loss.   Eyes: Negative for blurred vision and double vision.  Respiratory: Negative for cough and shortness of breath.   Cardiovascular: Positive for leg swelling. Negative for chest pain.  Gastrointestinal: Positive for constipation.  Genitourinary:       Urinary incontinence  Musculoskeletal: Positive for joint pain and myalgias.  Skin: Negative for rash.  Neurological: Positive for sensory change and focal weakness.  All other systems reviewed and are negative.      Past Medical History:  Diagnosis Date  . Cancer [facility])    FNA of thyroid positive for onconytic/hurthle cell carcinoma  . Chronic back pain   . DDD (degenerative disc disease), cervical   . DJD (degenerative joint disease)   . History of rectal fissure   . HIT (heparin-induced thrombocytopenia) ([place])   . Hypertension   . Obesity   . Paraplegia at T4 level ([place])   . Pulmonary embolus, right ([place]) 2015  . Rotator cuff tendonitis right        Past Surgical History:  Procedure Laterality Date  . LAMINECTOMY N/A 12/14/2013   Procedure: THORACIC LAMINECTOMY FOR TUMOR THORACIC THREE;  Surgeon: Ashok Pall, MD;  Location: [place] NEURO ORS;  Service: Neurosurgery;  Laterality: N/A;  THORACIC LAMINECTOMY FOR TUMOR THORACIC THREE  . LAMINECTOMY N/A 07/05/2015   Procedure: THORACIC LAMINECTOMY FOR TUMOR;  Surgeon: Ashok Pall, MD;  Location: [place] NEURO ORS;  Service: Neurosurgery;  Laterality: N/A;  .  LAMINECTOMY N/A 09/03/2017   Procedure: POSTERIOR SPINAL TUMOR RESECTION THORACIC THREE;  Surgeon: Ashok Pall, MD;  Location: [place];  Service: Neurosurgery;  Laterality: N/A;  POSTERIOR SPINAL TUMOR RESECTION THORACIC THREE  . POSTERIOR LUMBAR FUSION 4 LEVEL N/A 12/30/2013   Procedure: Thoracic one-Thoracic five posterior thoracic fusion with pedicle screws;  Surgeon: Ashok Pall, MD;  Location: [place] NEURO ORS;  Service: Neurosurgery;  Laterality: N/A;  Thoracic one-Thoracic five posterior thoracic fusion with pedicle screws  . THYROIDECTOMY N/A 01/09/2014   Procedure: TOTAL THYROIDECTOMY;  Surgeon: Izora Gala, MD;  Location: [place];  Service: ENT;  Laterality: N/A;  . TONSILLECTOMY          Family History  Problem Relation Age of Onset  . Hypertension Mother   . Diabetes Mother   . Hypertension Father   . Stroke Father   . Diabetes Father   . Diabetes Sister   . Diabetes Sister    Social History:  reports that she has never smoked. She has never used smokeless tobacco. She reports that she does not drink alcohol or use drugs. Allergies:       Allergies  Allergen Reactions  . Bee Venom Anaphylaxis  . Keflex [Cephalexin] Nausea And Vomiting  . Tramadol Nausea And Vomiting  . Gabapentin Nausea And Vomiting  . Heparin Other (See Comments)    Weak positive platelets induced antibodies, SRA negative--2015  . Hydrochlorothiazide Nausea And Vomiting  . Hydrocodone Nausea And Vomiting  . Oxycodone-Acetaminophen Nausea And Vomiting         Medications Prior to Admission  Medication Sig Dispense Refill  . baclofen (LIORESAL) 20 MG tablet TAKE (1) TABLET THREE TIMES DAILY. 90 tablet 1  . colchicine 0.6 MG tablet TAKE&nbsp;&nbsp;(1)&nbsp;&nbsp;TABLET TWICE A DAY AS NEEDED. 30 tablet 3  . fluticasone (FLONASE) 50 MCG/ACT nasal spray Place 2 sprays into both nostrils daily. 16 g 6  . furosemide (LASIX) 20 MG tablet Take 1 tablet (20 mg total) by mouth daily as needed. (Patient taking differently: Take 20 mg by mouth daily as needed for fluid. ) 30 tablet 1  . levothyroxine (SYNTHROID, LEVOTHROID) 137 MCG tablet Take 137 mcg by mouth daily before breakfast.    . lisinopril (PRINIVIL,ZESTRIL) 20 MG tablet Take 2 tablets (40 mg total) by mouth daily. 60 tablet 1  . NIFEdipine (PROCARDIA XL) 30 MG 24 hr tablet Take 1 tablet (30 mg total) by mouth  daily. 30 tablet 1  . ondansetron (ZOFRAN) 4 MG tablet Take 1 tablet (4 mg total) by mouth every 6 (six) hours. 30 tablet 5  . pantoprazole (PROTONIX) 40 MG tablet Take 1 tablet (40 mg total) by mouth daily. 30 tablet 2  . rivaroxaban (XARELTO) 20 MG TABS tablet Take 1 tablet (20 mg total) by mouth daily with supper. Hold 3 days prior to cholecystectomy 30 tablet 3  . senna (SENOKOT) 8.6 MG TABS tablet Take 2 tablets (17.2 mg total) by mouth daily. 120 each 0  . sertraline (ZOLOFT) 25 MG tablet Take 1 tablet (25 mg total) by mouth daily. 30 tablet 3  . fondaparinux (ARIXTRA) 2.5 MG/0.5ML SOLN injection Inject 0.5 mLs (2.5 mg total) into the skin daily at 6 (six) AM. (Patient not taking: Reported on 09/02/2017) 2.5 mL 3    Home: Home Living Family/patient expects to be discharged to:: Inpatient rehab Living Arrangements: Other relatives Available Help at Discharge: Available PRN/intermittently Type of Home: House Home Access: Ramped entrance Home Layout: One level Bathroom Shower/Tub: Tub/shower unit(pt unable  to access BR) Bathroom Toilet: Handicapped height(pt unable to access BR) Bathroom Accessibility: No Home Equipment: Environmental consultant - 2 wheels, Bedside commode, Shower seat, Wheelchair - power, Wheelchair - manual, Hospital bed, Other (comment)(sliding board) Additional Comments: Family reports that manual chair is falling apart  Functional History: Prior Function Level of Independence: Needs assistance Gait / Transfers Assistance Needed: Slide board transfers and ambulating short distances with RW prior to decline ADL's / Homemaking Assistance Needed: Pt performs bed/sink baths. Family performed cooking/cleaning tasks Comments: Pt uses manual chair most often for past 3 years. Pt was using RW for short distances with therapy only  until May 2019. Functional Status:  Mobility: Bed Mobility Overal bed mobility: Needs Assistance Bed Mobility: Supine to Sit Supine to sit: +2 for physical  assistance, Max assist General bed mobility comments: pt up in chair  Transfers Overall transfer level: Needs assistance Equipment used: None Transfers: Squat Pivot Transfers Squat pivot transfers: +2 physical assistance, Max assist General transfer comment: unable to safely attempt with +1 assist   ADL: ADL Overall ADL's : Needs assistance/impaired Eating/Feeding: Independent Grooming: Wash/dry hands, Wash/dry face, Oral care, Brushing hair, Set up, Sitting(supported sitting) Upper Body Bathing: Set up, Sitting(supported sitting ) Lower Body Bathing: Maximal assistance, Bed level, Sitting/lateral leans Upper Body Dressing : Minimal assistance, Sitting Lower Body Dressing: Total assistance, Sit to/from stand Toilet Transfer: Total assistance, BSC Toileting- Clothing Manipulation and Hygiene: Total assistance, Bed level Functional mobility during ADLs: Total assistance General ADL Comments: Pt T4 para.  Requires UE support to maintain unsupported sitting, and unable to access feet   Cognition: Cognition Overall Cognitive Status: Within Functional Limits for tasks assessed Orientation Level: Oriented X4 Cognition Arousal/Alertness: Awake/alert Behavior During Therapy: WFL for tasks assessed/performed Overall Cognitive Status: Within Functional Limits for tasks assessed  Blood pressure (!) 156/87, pulse 68, temperature 97.8 F (36.6 C), temperature source Oral, resp. rate 16, height 5' 10.98" (1.803 m), weight 99 kg (218 lb 4.1 oz), last menstrual period 10/22/2016, SpO2 100 %. Physical Exam  Constitutional: She is oriented to person, place, and time. She appears well-developed.  HENT:  Head: Normocephalic.  Eyes: Pupils are equal, round, and reactive to light.  Neck: Normal range of motion.  Cardiovascular: Normal rate.  Respiratory: Effort normal.  GI: Soft.  Musculoskeletal: She exhibits edema.  Neurological: She is alert and oriented to person, place, and time. No  cranial nerve deficit.  UE 5/5. B/L LE 0/5 no resting tone today. Sensory tr to 1/2 bilateral LE's. Right heel cord a bit tight.  Skin:  Scattered abrasions/bruises on both legs    LabResultsLast24Hours  No results found for this or any previous visit (from the past 24 hour(s)).   ImagingResults(Last48hours)  No results found.     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 Does the need for close, 24 hr/day medical supervision in concert with the patient's rehab needs make it unreasonable for this patient to be served in a less intensive setting? Yes 2. Co-Morbidities requiring supervision/potential complications: pain mgt, wound care, neurogenic bowel and bladder 3. Due to bladder management, bowel management, safety, skin/wound care, disease management, medication administration, pain management and patient education, does the patient require 24 hr/day rehab nursing? Yes 4. Does the patient require coordinated care of a physician, rehab nurse, PT (1-2 hrs/day, 5 days/week) and OT (1-2 hrs/day, 5 days/week) to address physical and functional deficits in the context of the above medical diagnosis(es)? Yes Addressing deficits  in the following areas: balance, endurance, locomotion, strength, transferring, bowel/bladder control, bathing, dressing, feeding, grooming, toileting and psychosocial support 5. Can the patient actively participate in an intensive therapy program of at least 3 hrs of therapy per day at least 5 days per week? Yes 6. The potential for patient to make measurable gains while on inpatient rehab is excellent 7. Anticipated functional outcomes upon discharge from inpatient rehab are modified independent  with PT, modified independent, supervision and min assist with OT, n/a with SLP. Likely wheel chair level goals.  8. Estimated rehab length of stay to reach the above functional goals is: 20-24  days 9. Anticipated D/C setting: Home 10. Anticipated post D/C treatments: HH therapy and Outpatient therapy 11. Overall Rehab/Functional Prognosis: good  RECOMMENDATIONS: This patient's condition is appropriate for continued rehabilitative care in the following setting: CIR Patient has agreed to participate in recommended program. Yes Note that insurance prior authorization may be required for reimbursement for recommended care.   Comment: Pt is very well known to me. She is extremely motivated and has proven very resilient over the years. Rehab Admissions Coordinator to follow up.  Thanks,  [name], MD, Mellody Drown  I have personally performed a face to face diagnostic evaluation of this patient. Additionally, I have reviewed and concur with the physician assistant's documentation above.       Lavon Paganini Angiulli, PA-C 09/07/2017          Revision History                   Routing History

## 2017-09-21 NOTE — Progress Notes (Signed)
Physical Therapy Treatment Patient Details Name: Sarah [name] MRN: [id number] DOB: 1966-01-02 Today's Date: 09/21/2017    History of Present Illness [name] a 52 y/o female initially admitted to [facility] on [facility]/17 due to worsening L Eweakness and fall at home. MRI revealed tumor recurrence at T3 with severe spinal cord compression. Posterior spinal tumor resection on 09/03/17. Admitted to CIR on 09/09/17. On 09/10/17 noted to have displaced oblique distal left femoral metadiaphyseal fracture with IM nailing on 09/11/17.  Participating with PT/OT. Noted patient admitted and discharged same day from CIR due to intractable nausea and vomiting. 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     PT Comments    [place] motivated to work with PT this morning. PT session focusing on improving functional independence with bed mobility and transfers. Use of slide board for bed to recliner transfer with patient able to assist with weight shifting and use of UE to assist in lateral transfer. Does continue to require Max A +2 for safety and efficiency but with appropriate effort by patient. PT recommendations remain appropriate.   Follow Up Recommendations  CIR;Supervision/Assistance - 24 hour     Equipment Recommendations  None recommended by PT    Recommendations for Other Services Rehab consult     Precautions / Restrictions Precautions Precautions: Fall;Back Precaution Comments: throracic region, LEs spasicity  Restrictions Weight Bearing Restrictions: Yes LLE Weight Bearing: Non weight bearing    Mobility  Bed Mobility Overal bed mobility: Needs Assistance Bed Mobility: Supine to Sit;Rolling Rolling: Max assist   Supine to sit: Max assist;+2 for physical assistance;HOB elevated     General bed mobility comments: use of bed pad to come to EOB. Patient able to assist with UE on  handrail  Transfers Overall transfer level: Needs assistance Equipment used: Sliding board Transfers: Lateral/Scoot Transfers          Lateral/Scoot Transfers: Max assist;+2 physical assistance General transfer comment: improved upright sitting balance; but does require use of B UE to maintain; able to assist with weight shift for board placement and use of UE to assist with transfer.   Ambulation/Gait                 Stairs             Wheelchair Mobility    Modified Rankin (Stroke Patients Only)       Balance Overall balance assessment: Needs assistance Sitting-balance support: Bilateral upper extremity supported;Feet supported Sitting balance-Leahy Scale: Poor                                      Cognition Arousal/Alertness: Awake/alert Behavior During Therapy: WFL for tasks assessed/performed Overall Cognitive Status: Within Functional Limits for tasks assessed                                        Exercises      General Comments        Pertinent Vitals/Pain Pain Assessment: No/denies pain    Home Living                      Prior Function  PT Goals (current goals can now be found in the care plan section) Acute Rehab PT Goals Patient Stated Goal: return to CIR PT Goal Formulation: With patient Time For Goal Achievement: 09/25/17 Potential to Achieve Goals: Fair Progress towards PT goals: Progressing toward goals    Frequency    Min 4X/week      PT Plan Current plan remains appropriate    Co-evaluation              AM-PAC PT "6 Clicks" Daily Activity  Outcome Measure  Difficulty turning over in bed (including adjusting bedclothes, sheets and blankets)?: Unable Difficulty moving from lying on back to sitting on the side of the bed? : Unable Difficulty sitting down on and standing up from a chair with arms (e.g., wheelchair, bedside commode, etc,.)?: Unable Help  needed moving to and from a bed to chair (including a wheelchair)?: A Lot Help needed walking in hospital room?: Total Help needed climbing 3-5 steps with a railing? : Total 6 Click Score: 7    End of Session Equipment Utilized During Treatment: Gait belt Activity Tolerance: Patient tolerated treatment well Patient left: in chair;with call bell/phone within reach Nurse Communication: Mobility status PT Visit Diagnosis: Other abnormalities of gait and mobility (R26.89);Pain;Muscle weakness (generalized) (M62.81) Pain - Right/Left: Left Pain - part of body: Knee     Time: 6924-9324 PT Time Calculation (min) (ACUTE ONLY): 28 min  Charges:  $Therapeutic Activity: 23-37 mins                    G Codes:       Lanney Gins, PT, DPT 09/21/17 12:07 PM

## 2017-09-21 NOTE — PMR Pre-admission (Addendum)
PMR Admission Coordinator Pre-Admission Assessment  Patient: Sarah [name] is an 52 y.o., female MRN: [id number] DOB: 1965-08-13 Height: 5\' 11"  (180.3 cm) Weight: 107.9 kg (237 lb 14 oz)              Insurance Information HMO: yes    PPO:      PCP:      IPA:      80/20:      OTHER: medicare advantage plan PRIMARY: U[employer] medicare      Policy#: [id number]      Subscriber: pt CM Name: Caryl Pina      Phone#: [phone]     Fax#: [phone] f/u with Rudene Re [phone] Pre-Cert#: [id number]   Employer: retired Benefits:  Phone #: online     Name:  Irene Shipper. Date: 03/24/2017     Deduct: none      Out of Pocket Max: [phone]      Life Max: none CIR: $430 co pay per day days 1 until 4      SNF: no co pay days 1 until 20; $160 co pay per day days 21- 62; no co pay days 61 until 100 Outpatient: $40     Co-Pay:  Per medical necessity visits Home Health: 100%      Co-Pay: visits per medical neccesity DME: 80%     Co-Pay: 20% Providers: in network  SECONDARY: none       Medicaid Application Date:       Case Manager:  Disability Application Date:       Case Worker:   Emergency Contact Information Contact Information    Name Relation Home Work [place] Mother [phone]  [phone]   Ezzard Flax [phone]       Current Medical History  Patient Admitting Diagnosis: T4 paraplegia  History of Present Illness:  HPI: Sarah [name] is a 52 year old right-handed female with history of hypertension, pulmonary emboli maintained on Xarelto, 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  Per chart review she lives with sister and brother-in-law.  One level home with ramped entrance.  Up until recently she was using a sliding board for transfers and ambulating short distances with a rolling walker up until May 2019.  She had been using a wheelchair most the time for the past few years otherwise.  She has had 2 previous back surgeries T3 in 2015 and 2017  he did receive inpatient rehab services.  Presented 09/02/2017 with recent fall increasing leg weakness over the past 2 weeks.  She was to see her oncologist 08/24/2017 for follow-up MRI that was planned however could not make the appointment due to lower extremity weakness.  She did also have some urinary incontinence with noted neurogenic bladder.  MRI and imaging revealed tumor recurrence at T3 with severe spinal cord compression.  Underwent posterior spinal tumor resection T3 09/03/2017 per Dr. Christella Noa.  Radiation oncology follow-up Dr. Dara Lords to follow-up outpatient for plan radiation therapy.  Decadron protocol was indicated.  Hospital course anemia and monitored.  E. coli UTI completed a course of Rocephin.  Physical and occupational therapy evaluations completed and patient was admitted for a comprehensive rehab program 09/08/2017.  Follow-up labs on admission to rehab services showed hemoglobin 6.2 that was confirmed and patient was transfused.  Patient's initial plan was to resume Xarelto for history of pulmonary emboli but held due to anemia.  CT of lumbar and thoracic spine as well as pelvis were unremarkable.  Hemoglobin  rebounded 8.7.  Patient with ongoing complaints of left knee pain and some effusion.  X-rays and imaging revealed left femoral shaft fracture likely felt to be subacute with noted history of recent fall.  Orthopedic services consulted she was discharged to acute care services and underwent intramedullary retrograde femoral nailing 09/11/2017 per Dr. Marcelino Scot.  Nonweightbearing left lower extremity.  Her Xarelto was resumed postoperatively.  Close monitoring of hemoglobin and hematocrit.  Physical and Occupational Therapy evaluations completed postoperatively 622 and patient was admitted to inpatient rehab services 09/15/2017.  Upon admission to inpatient rehab services patient with intractable nausea vomiting noted bouts of constipation.  She was again discharged back to acute care services.   Abdominal film showed nonobstructive bowel gas pattern.  Moderate colorectal stool volume indicating constipation.  Ultrasound the abdomen showed cholelithiasis without complicating factors.  Bowel program was established.  Nausea vomiting greatly improved.        Past Medical History  Past Medical History:  Diagnosis Date  . Cancer [facility])    FNA of thyroid positive for onconytic/hurthle cell carcinoma  . Chronic back pain   . DDD (degenerative disc disease), cervical   . DJD (degenerative joint disease)   . Family history of adverse reaction to anesthesia    MOTHER HAS NAUSEA   . History of rectal fissure   . HIT (heparin-induced thrombocytopenia) ([place])   . Hypertension   . Obesity   . Paraplegia at T4 level ([place])   . Pulmonary embolus, right ([place]) 2015  . Rotator cuff tendonitis right    Family History  family history includes Diabetes in her father, mother, sister, and sister; Hypertension in her father and mother; Stroke in her father.  Prior Rehab/Hospitalizations:  Has the patient had major surgery during 100 days prior to admission? Yes  Current Medications   Current Facility-Administered Medications:  .  0.9 %  sodium chloride infusion, , Intravenous, Continuous, Emokpae, Courage, MD, Last Rate: 30 mL/hr at 09/21/17 1504 .  acetaminophen (TYLENOL) tablet 650 mg, 650 mg, Oral, Q6H PRN, Reyne Dumas, MD, 650 mg at 09/21/17 1213 .  baclofen (LIORESAL) tablet 20 mg, 20 mg, Oral, QHS, Emokpae, Courage, MD, 20 mg at 09/19/17 2128 .  dexamethasone (DECADRON) tablet 4 mg, 4 mg, Oral, Q8H, Emokpae, Courage, MD, 4 mg at 09/21/17 1308 .  famotidine (PEPCID) tablet 20 mg, 20 mg, Oral, BID, Karren Cobble, RPH, 20 mg at 09/21/17 0900 .  fluticasone (FLONASE) 50 MCG/ACT nasal spray 1 spray, 1 spray, Each Nare, BID, Emokpae, Courage, MD, 1 spray at 09/21/17 0857 .  hydrALAZINE (APRESOLINE) injection 10 mg, 10 mg, Intravenous, Q4H PRN, Rise Patience, MD .  insulin aspart  (novoLOG) injection 0-9 Units, 0-9 Units, Subcutaneous, Q4H, Rise Patience, MD, 1 Units at 09/19/17 2128 .  levothyroxine (SYNTHROID, LEVOTHROID) tablet 137 mcg, 137 mcg, Oral, QAC breakfast, Reyne Dumas, MD, 137 mcg at 09/21/17 0858 .  ondansetron (ZOFRAN) tablet 4 mg, 4 mg, Oral, Q6H PRN **OR** ondansetron (ZOFRAN) injection 4 mg, 4 mg, Intravenous, Q6H PRN, Rise Patience, MD, 4 mg at 09/21/17 0052 .  pantoprazole (PROTONIX) EC tablet 40 mg, 40 mg, Oral, Daily, Abrol, Nayana, MD, 40 mg at 09/21/17 0900 .  phenol (CHLORASEPTIC) mouth spray 1 spray, 1 spray, Mouth/Throat, PRN, D[place] Brick, Courage, MD, 1 spray at 09/18/17 1812 .  polyethylene glycol (MIRALAX / GLYCOLAX) packet 17 g, 17 g, Oral, BID, Abrol, Nayana, MD, 17 g at 09/20/17 0912 .  promethazine (PHENERGAN) injection 12.5 mg, 12.5  mg, Intravenous, Q6H PRN, Reyne Dumas, MD, 12.5 mg at 09/21/17 0647 .  rivaroxaban (XARELTO) tablet 20 mg, 20 mg, Oral, Q supper, Abrol, Nayana, MD, 20 mg at 09/20/17 1616 .  senna (SENOKOT) tablet 17.2 mg, 2 tablet, Oral, QHS, Emokpae, Courage, MD .  sertraline (ZOLOFT) tablet 25 mg, 25 mg, Oral, Daily, Emokpae, Courage, MD, 25 mg at 09/21/17 0900  Patients Current Diet:  Diet Order           Diet regular Room service appropriate? Yes; Fluid consistency: Thin  Diet effective now          Precautions / Restrictions Precautions Precautions: Fall, Back Precaution Comments: throracic region, LEs spasicity  Restrictions Weight Bearing Restrictions: Yes LLE Weight Bearing: Non weight bearing   Has the patient had 2 or more falls or a fall with injury in the past year?No  Prior Activity Level  not driving; went out 2 to 3 times per week; slide board transfers and short ambulation with [place] / [place] Devices/Equipment: Wheelchair, Hospital bed, Bedside commode/3-in-1 Home Equipment: Environmental consultant - 2 wheels, Bedside commode, Shower seat, Wheelchair - power,  Wheelchair - manual, Hospital bed, Other (comment)  Prior Device Use: Indicate devices/aids used by the patient prior to current illness, exacerbation or injury? Manual wheelchair  Prior Functional Level Prior Function Level of [place]: Needs assistance Gait / Transfers Assistance Needed: Prior to tumor resection, was performing slide board transfers and short distance ambulation. At rehab, was utilizing [place] for sit<>stand.  ADL's / Homemaking Assistance Needed: Assistance for LB ADL. Able to complete UB ADL without assistance at bed level.  Comments: Pt uses manual chair most often for past 3 years. Pt was using RW for short distances with therapy only  until May 2019. Pt reports staying home most days with her nephew  Self Care: Did the patient need help bathing, dressing, using the toilet or eating?  Needed some help  Indoor Mobility: Did the patient need assistance with walking from room to room (with or without device)? Needed some help  Stairs: Did the patient need assistance with internal or external stairs (with or without device)? Needed some help  Functional Cognition: Did the patient need help planning regular tasks such as shopping or remembering to take medications? Needed some help  Current Functional Level Cognition  Overall Cognitive Status: Within Functional Limits for tasks assessed Orientation Level: Oriented X4    Extremity Assessment (includes Sensation/Coordination)  Upper Extremity Assessment: Generalized weakness  Lower Extremity Assessment: Defer to PT evaluation RLE Deficits / Details: little to no intentional motion noted at B LE due to T4 para; spasticity noted; little to no sensation RLE Sensation: decreased light touch LLE Deficits / Details: little to no intentional motion noted at B LE due to T4 para; spasticity noted; little to no sensation LLE Sensation: decreased light touch    ADLs  Overall ADL's : Needs assistance/impaired Grooming:  Set up, Sitting Upper Body Bathing: Set up, Sitting Lower Body Bathing: Maximal assistance, Bed level Upper Body Dressing : Minimal assistance, Bed level Lower Body Dressing: Total assistance, Bed level Toilet Transfer: Maximal assistance, +2 for physical assistance(simulated) Toileting- Clothing Manipulation and Hygiene: Total assistance Toileting - Clothing Manipulation Details (indicate cue type and reason): incontinenet Functional mobility during ADLs: Maximal assistance, +2 for physical assistance    Mobility  Overal bed mobility: Needs Assistance Bed Mobility: Supine to Sit, Rolling Rolling: Max assist Supine to sit: Max assist, +2 for physical  assistance, HOB elevated General bed mobility comments: use of bed pad to come to EOB. Patient able to assist with UE on handrail    Transfers  Overall transfer level: Needs assistance Equipment used: Sliding board Transfers: Lateral/Scoot Transfers  Lateral/Scoot Transfers: Max assist, +2 physical assistance General transfer comment: improved upright sitting balance; but does require use of B UE to maintain; able to assist with weight shift for board placement and use of UE to assist with transfer.     Ambulation / Gait / Stairs / Office manager / Balance Dynamic Sitting Balance Sitting balance - Comments: able to tolerate sitting EOB for ~4 minutes with min assist to maintain upright posture; BUE support required Balance Overall balance assessment: Needs assistance Sitting-balance support: Bilateral upper extremity supported, Feet supported Sitting balance-Leahy Scale: Poor Sitting balance - Comments: able to tolerate sitting EOB for ~4 minutes with min assist to maintain upright posture; BUE support required    Special needs/care consideration BiPAP/CAP  N/a CPM n/a Continuous Drip IV IVF at 30 cc/hr Dialysis  N/a Life Vest n/a Oxygen n/a Special Bed overlay mattress Trach Size n/a Wound Vac n/a Skin  moisture associated skin damage bilateral groin area; right thigh blister; ecchymosis abdomen, bilateral arms and legs; Date of Service:  09/20/2017 9:11 AM          Signed           Show:Clear all [x] Manual[x] Template[] Copied  Added by: [x] McNichol, Rudi Heap, RN   [] Hover for details   WOC Nurse wound consult note Reason for Consult: Upper back (thoracic)  Incision line Wound type: surgical Pressure Injury POA: Yes/No/NA Measurement:11cm x 0.4cm x 0.2cm  Wound MVE:HMCN, wet Drainage (amount, consistency, odor) No fresh exudate noted Peri wound: Dried serosanguinous exudate beneath old dressing Dressing procedure/placement/frequency: I will implement a conservative POC for the area, using an antimicrobial astringent dressing (xeroform) after cleansing and with daily changes.  [place] nursing team will not follow, but will remain available to this patient, the nursing and medical teams.  Please re-consult if needed. Thanks, Maudie Flakes, MSN, RN, [place], Arther Abbott  Pager# [phone]             Bowel mgmt: incontinent 09/21/2017 Bladder mgmt: external catheter Diabetic mgmt  N/a   Previous Home Environment Living Arrangements: Other relatives  Lives With: Family Available Help at Discharge: Available PRN/intermittently Type of Home: House Home Layout: One level Home Access: Ramped entrance Bathroom Shower/Tub: Chiropodist: Handicapped height Bathroom Accessibility: No [place]: Yes Type of [place]: [place] (if known): [facility] Additional Comments: Pt reports using bedside commode  Discharge Living Setting Plans for Discharge Living Setting: House, Lives with (comment) Type of Home at Discharge: House Discharge Home Layout: Two level, Laundry or work area in basement, Able to live on main level with bedroom/bathroom Alternate Level Stairs-Number of Steps: Flight Discharge Home  Access: Ramped entrance Discharge Bathroom Shower/Tub: Tub/shower unit Discharge Bathroom Toilet: Handicapped height Discharge Bathroom Accessibility: No Does the patient have any problems obtaining your medications?: No  Social/Family/Support Systems Patient Roles: Other (Comment) Contact Information: Miyoshi Ligas - mother - [phone] Anticipated Caregiver: Sister, brother in-law and Mom Ability/Limitations of Caregiver: Sister works part time for the post office and Mom lives 4 minutes away Caregiver Availability: Evenings only Discharge Plan Discussed with Primary Caregiver: Yes Is Caregiver In Agreement with Plan?: Yes Does Caregiver/Family have Issues with Lodging/Transportation while  Pt is in Rehab?: No  Goals/Additional Needs Patient/Family Goal for Rehab: PT mod I, OT mod I to supervision to min assist goals.  Likely will be W/C level goals Expected length of stay: 20-24 days Cultural Considerations: None Dietary Needs: Regular diet, thin liquids Equipment Needs: TBD Pt/Family Agrees to Admission and willing to participate: Yes Program Orientation Provided & Reviewed with Pt/Caregiver Including Roles  & Responsibilities: Yes  Decrease burden of Care through IP rehab admission: n/a  Possible need for SNF placement upon discharge: not anticipated   Patient Condition: Patient will benefit from readmission to inpatient acute rehabilitation to complete her rehab program before discharge home. Her condition and has changed since her original consult on 09/07/2017. Please see H and P for updates. She will benefit from the coordinated team approach offered with PT, OT, Rehab MD as well as Rehab Nursing. I will admit patient today.  Preadmission Screen Completed By:  Cleatrice Burke, 09/21/2017 3:06 PM ______________________________________________________________________   Discussed status with Dr. Posey Pronto on 09/21/2017 at  1513  and received telephone approval for admission  today.  Admission Coordinator:  Cleatrice Burke, time 3779 Date 09/21/2017

## 2017-09-21 NOTE — Progress Notes (Signed)
Sarah Staggers, MD  Physician  Physical Medicine and Rehabilitation  Consult Note  Signed  Date of Service:  09/07/2017 6:14 AM       Related encounter: ED to Hosp-Admission (Discharged) from 09/02/2017 in [facility] All Collapse All      Show:Clear all [x] Manual[x] Template[] Copied  Added by: [x] [name], Sarah Paganini, PA-C[x] Sarah Staggers, MD   [] Hover for details        Physical Medicine and Rehabilitation Consult Reason for Consult: Decreased functional mobility Referring Physician: Dr. Christella Noa   HPI: Sarah [name] is a 52 y.o. 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  Per chart review patient lives with sister and brother-in-law.  One level home with ramped entrance.  Up until recently she was using a sliding board for transfers and ambulating short distances with a rolling walker up until May 2019.  She had been using a  wheelchair most of the time for the past 3 years.  Patient has had 2 previous back surgeries at [facility] in 2015 and 2017.  Patient with recent admission approximately 2-1/2 weeks ago for nausea and vomiting was later discharged to home.  Presented 09/02/2017 with recent fall and increasing leg weakness over the past 2 weeks.  Patient initially was to see her oncologist 08/24/2017 for MRI that was planned however could not make the appointment due to lower extremity weakness.  She did report some urinary incontinence.  MRI and imaging revealed tumor recurrence at [facility] with severe spinal cord compression.  Underwent posterior spinal tumor resection [facility] 09/03/2017 per Dr. Christella Noa.  Hospital course pain management.  Radiation oncology follow-up Dr. Dara Lords await possible plan for need for radiation therapy.  Decadron protocol as indicated.  Physical and occupational therapy evaluations completed  with recommendations of physical medicine rehab consult.   Review of Systems  Constitutional: Negative for fever.  HENT: Negative for hearing loss.   Eyes: Negative for blurred vision and double vision.  Respiratory: Negative for cough and shortness of breath.   Cardiovascular: Positive for leg swelling. Negative for chest pain.  Gastrointestinal: Positive for constipation.  Genitourinary:       Urinary incontinence  Musculoskeletal: Positive for joint pain and myalgias.  Skin: Negative for rash.  Neurological: Positive for sensory change and focal weakness.  All other systems reviewed and are negative.      Past Medical History:  Diagnosis Date  . Cancer [facility])    FNA of thyroid positive for onconytic/hurthle cell carcinoma  . Chronic back pain   . DDD (degenerative disc disease), cervical   . DJD (degenerative joint disease)   . History of rectal fissure   . HIT (heparin-induced thrombocytopenia) ([place])   . Hypertension   . Obesity   . Paraplegia at T4 level ([place])   . Pulmonary embolus, right ([place]) 2015  . Rotator cuff tendonitis right        Past Surgical History:  Procedure Laterality Date  . LAMINECTOMY N/A 12/14/2013   Procedure: THORACIC LAMINECTOMY FOR TUMOR THORACIC THREE;  Surgeon: Ashok Pall, MD;  Location: [place] [facility]UR[facility] ORS;  Service: Neurosurgery;  Laterality: N/A;  THORACIC LAMINECTOMY FOR TUMOR THORACIC THREE  . LAMINECTOMY N/A 07/05/2015   Procedure: THORACIC LAMINECTOMY FOR TUMOR;  Surgeon: Ashok Pall, MD;  Location: [place] NEUR[facility] ORS;  Service: Neurosurgery;  Laterality: N/A;  .  LAMINECTOMY N/A 09/03/2017   Procedure: POSTERIOR SPINAL TUMOR RESECTION THORACIC THREE;  Surgeon: Ashok Pall, MD;  Location: [place];  Service: Neurosurgery;  Laterality: N/A;  POSTERIOR SPINAL TUMOR RESECTION THORACIC THREE  . POSTERIOR LUMBAR FUSION 4 LEVEL N/A 12/30/2013   Procedure: Thoracic one-Thoracic five posterior thoracic fusion with pedicle screws;  Surgeon: Ashok Pall, MD;  Location: [place] NEUR[facility] ORS;  Service: Neurosurgery;  Laterality: N/A;  Thoracic one-Thoracic five posterior thoracic fusion with pedicle screws  . THYROIDECTOMY N/A 01/09/2014   Procedure: TOTAL THYROIDECTOMY;  Surgeon: Izora Gala, MD;  Location: [place];  Service: ENT;  Laterality: N/A;  . TONSILLECTOMY          Family History  Problem Relation Age of Onset  . Hypertension Mother   . Diabetes Mother   . Hypertension Father   . Stroke Father   . Diabetes Father   . Diabetes Sister   . Diabetes Sister    Social History:  reports that she has never smoked. She has never used smokeless tobacco. She reports that she does not drink alcohol or use drugs. Allergies:       Allergies  Allergen Reactions  . Bee Venom Anaphylaxis  . Keflex [Cephalexin] Nausea And Vomiting  . Tramadol Nausea And Vomiting  . Gabapentin Nausea And Vomiting  . Heparin Other (See Comments)    Weak positive platelets induced antibodies, SRA negative--2015  . Hydrochlorothiazide Nausea And Vomiting  . Hydrocodone Nausea And Vomiting  . Oxycodone-Acetaminophen Nausea And Vomiting         Medications Prior to Admission  Medication Sig Dispense Refill  . baclofen (LIORESAL) 20 MG tablet TAKE (1) TABLET THREE TIMES DAILY. 90 tablet 1  . colchicine 0.6 MG tablet TAKE&nbsp;&nbsp;(1)&nbsp;&nbsp;TABLET TWICE A DAY AS NEEDED. 30 tablet 3  . fluticasone (FLONASE) 50 MCG/ACT nasal spray Place 2 sprays into both nostrils daily. 16 g 6  . furosemide (LASIX) 20 MG tablet Take 1 tablet (20 mg total) by mouth daily as needed. (Patient taking differently: Take 20 mg by mouth daily as needed for fluid. ) 30 tablet 1  . levothyroxine (SYNTHROID, LEVOTHROID) 137 MCG tablet Take 137 mcg by mouth daily before breakfast.    . lisinopril (PRINIVIL,ZESTRIL) 20 MG tablet Take 2 tablets (40 mg total) by mouth daily. 60 tablet 1  . NIFEdipine (PROCARDIA XL) 30 MG 24 hr tablet Take 1 tablet (30 mg total) by mouth  daily. 30 tablet 1  . ondansetron (ZOFRAN) 4 MG tablet Take 1 tablet (4 mg total) by mouth every 6 (six) hours. 30 tablet 5  . pantoprazole (PROTONIX) 40 MG tablet Take 1 tablet (40 mg total) by mouth daily. 30 tablet 2  . rivaroxaban (XARELTO) 20 MG TABS tablet Take 1 tablet (20 mg total) by mouth daily with supper. Hold 3 days prior to cholecystectomy 30 tablet 3  . senna (SENOKOT) 8.6 MG TABS tablet Take 2 tablets (17.2 mg total) by mouth daily. 120 each 0  . sertraline (ZOLOFT) 25 MG tablet Take 1 tablet (25 mg total) by mouth daily. 30 tablet 3  . fondaparinux (ARIXTRA) 2.5 MG/0.5ML SOLN injection Inject 0.5 mLs (2.5 mg total) into the skin daily at 6 (six) AM. (Patient not taking: Reported on 09/02/2017) 2.5 mL 3    Home: Home Living Family/patient expects to be discharged to:: Inpatient rehab Living Arrangements: Other relatives Available Help at Discharge: Available PRN/intermittently Type of Home: House Home Access: Ramped entrance Home Layout: One level Bathroom Shower/Tub: Tub/shower unit(pt unable  to access BR) Bathroom Toilet: Handicapped height(pt unable to access BR) Bathroom Accessibility: No Home Equipment: Environmental consultant - 2 wheels, Bedside commode, Shower seat, Wheelchair - power, Wheelchair - manual, Hospital bed, Other (comment)(sliding board) Additional Comments: Family reports that manual chair is falling apart  Functional History: Prior Function Level of Independence: Needs assistance Gait / Transfers Assistance Needed: Slide board transfers and ambulating short distances with RW prior to decline ADL's / Homemaking Assistance Needed: Pt performs bed/sink baths. Family performed cooking/cleaning tasks Comments: Pt uses manual chair most often for past 3 years. Pt was using RW for short distances with therapy only  until May 2019. Functional Status:  Mobility: Bed Mobility Overal bed mobility: Needs Assistance Bed Mobility: Supine to Sit Supine to sit: +2 for physical  assistance, Max assist General bed mobility comments: pt up in chair  Transfers Overall transfer level: Needs assistance Equipment used: None Transfers: Squat Pivot Transfers Squat pivot transfers: +2 physical assistance, Max assist General transfer comment: unable to safely attempt with +1 assist   ADL: ADL Overall ADL's : Needs assistance/impaired Eating/Feeding: Independent Grooming: Wash/dry hands, Wash/dry face, Oral care, Brushing hair, Set up, Sitting(supported sitting) Upper Body Bathing: Set up, Sitting(supported sitting ) Lower Body Bathing: Maximal assistance, Bed level, Sitting/lateral leans Upper Body Dressing : Minimal assistance, Sitting Lower Body Dressing: Total assistance, Sit to/from stand Toilet Transfer: Total assistance, BSC Toileting- Clothing Manipulation and Hygiene: Total assistance, Bed level Functional mobility during ADLs: Total assistance General ADL Comments: Pt T4 para.  Requires UE support to maintain unsupported sitting, and unable to access feet   Cognition: Cognition Overall Cognitive Status: Within Functional Limits for tasks assessed Orientation Level: Oriented X4 Cognition Arousal/Alertness: Awake/alert Behavior During Therapy: WFL for tasks assessed/performed Overall Cognitive Status: Within Functional Limits for tasks assessed  Blood pressure (!) 156/87, pulse 68, temperature 97.8 F (36.6 C), temperature source Oral, resp. rate 16, height 5' 10.98" (1.803 m), weight 99 kg (218 lb 4.1 oz), last menstrual period 10/22/2016, SpO2 100 %. Physical Exam  Constitutional: She is oriented to person, place, and time. She appears well-developed.  HENT:  Head: Normocephalic.  Eyes: Pupils are equal, round, and reactive to light.  Neck: Normal range of motion.  Cardiovascular: Normal rate.  Respiratory: Effort normal.  GI: Soft.  Musculoskeletal: She exhibits edema.  Neurological: She is alert and oriented to person, place, and time. No  cranial nerve deficit.  UE 5/5. B/L LE 0/5 no resting tone today. Sensory tr to 1/2 bilateral LE's. Right heel cord a bit tight.  Skin:  Scattered abrasions/bruises on both legs    LabResultsLast24Hours  No results found for this or any previous visit (from the past 24 hour(s)).   ImagingResults(Last48hours)  No results found.     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 Does the need for close, 24 hr/day medical supervision in concert with the patient's rehab needs make it unreasonable for this patient to be served in a less intensive setting? Yes 2. Co-Morbidities requiring supervision/potential complications: pain mgt, wound care, neurogenic bowel and bladder 3. Due to bladder management, bowel management, safety, skin/wound care, disease management, medication administration, pain management and patient education, does the patient require 24 hr/day rehab nursing? Yes 4. Does the patient require coordinated care of a physician, rehab nurse, PT (1-2 hrs/day, 5 days/week) and OT (1-2 hrs/day, 5 days/week) to address physical and functional deficits in the context of the above medical diagnosis(es)? Yes Addressing deficits  in the following areas: balance, endurance, locomotion, strength, transferring, bowel/bladder control, bathing, dressing, feeding, grooming, toileting and psychosocial support 5. Can the patient actively participate in an intensive therapy program of at least 3 hrs of therapy per day at least 5 days per week? Yes 6. The potential for patient to make measurable gains while on inpatient rehab is excellent 7. Anticipated functional outcomes upon discharge from inpatient rehab are modified independent  with PT, modified independent, supervision and min assist with OT, n/a with SLP. Likely wheel chair level goals.  8. Estimated rehab length of stay to reach the above functional goals is: 20-24  days 9. Anticipated D/C setting: Home 10. Anticipated post D/C treatments: HH therapy and Outpatient therapy 11. Overall Rehab/Functional Prognosis: good  RECOMMENDATIONS: This patient's condition is appropriate for continued rehabilitative care in the following setting: CIR Patient has agreed to participate in recommended program. Yes Note that insurance prior authorization may be required for reimbursement for recommended care.   Comment: Pt is very well known to me. She is extremely motivated and has proven very resilient over the years. Rehab Admissions Coordinator to follow up.  Thanks,  Sarah Staggers, MD, Mellody Drown  I have personally performed a face to face diagnostic evaluation of this patient. Additionally, I have reviewed and concur with the physician assistant's documentation above.       Sarah Paganini Angiulli, PA-C 09/07/2017          Revision History                   Routing History

## 2017-09-21 NOTE — Discharge Summary (Signed)
Sarah [name], is a 52 y.o. female  DOB May 22, 1965  MRN [id number].  Admission date:  09/16/2017  Admitting Physician  Rise Patience, MD  Discharge Date:  09/21/2017   Primary MD  [name], MD  Recommendations for primary care physician for things to follow:   1)Please  recheck CBC on 09/24/2017 and transfuse as clinically indicated  2)Please call Dr. 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 Cancer [facility])    FNA of thyroid positive for onconytic/hurthle cell carcinoma  . Chronic back pain   . DDD (degenerative disc disease), cervical   . DJD (degenerative joint disease)   . Family history of adverse reaction to anesthesia    MOTHER HAS NAUSEA   . History of rectal fissure   . HIT (heparin-induced thrombocytopenia) ([place])   . Hypertension   . Obesity   . 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 Pulmonary embolus, right ([place]) 2015  . Rotator cuff tendonitis right    Past Surgical History:  Procedure Laterality Date  . FEMUR IM NAIL Left 09/11/2017   Procedure: INTRAMEDULLARY (IM) RETROGRADE FEMORAL NAILING;  Surgeon: Altamese Avon, MD;  Location: [place];  Service: Orthopedics;  Laterality: Left;  . LAMINECTOMY N/A 12/14/2013   Procedure: THORACIC  LAMINECTOMY FOR TUMOR THORACIC THREE;  Surgeon: Ashok Pall, MD;  Location: [place] NEURO ORS;  Service: Neurosurgery;  Laterality: N/A;  THORACIC LAMINECTOMY FOR TUMOR THORACIC THREE  . LAMINECTOMY N/A 07/05/2015   Procedure: THORACIC LAMINECTOMY FOR TUMOR;  Surgeon: Ashok Pall, MD;  Location: [place] NEURO ORS;  Service: Neurosurgery;  Laterality: N/A;  . LAMINECTOMY N/A 09/03/2017   Procedure: POSTERIOR SPINAL TUMOR RESECTION THORACIC THREE;  Surgeon: Ashok Pall, MD;  Location: [place];  Service: Neurosurgery;  Laterality: N/A;  POSTERIOR SPINAL TUMOR RESECTION THORACIC THREE  . POSTERIOR LUMBAR FUSION 4 LEVEL N/A 12/30/2013   Procedure: Thoracic one-Thoracic five posterior thoracic fusion with pedicle screws;  Surgeon: Ashok Pall, MD;  Location: [place] NEURO ORS;  Service: Neurosurgery;  Laterality: N/A;  Thoracic one-Thoracic five posterior thoracic fusion with pedicle screws  . THYROIDECTOMY N/A 01/09/2014   Procedure: TOTAL THYROIDECTOMY;  Surgeon: Izora Gala, MD;  Location: [place];  Service: ENT;  Laterality: N/A;  . TONSILLECTOMY         HPI  from the history  and physical done on the day of admission:    Chief Complaint: Nausea vomiting.  HPI: Sarah [name] is a 52 y.o. 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  Patient was found to have persistent nausea vomiting with left upper quadrant abdominal pain.  Patient had multiple episodes of vomiting intractable.  Hospitalist was called for further evaluation.  At the time of my exam patient states he has been in constant pain in the left upper quadrant denies any chest pain  shortness of breath.  X-ray revealed moderate stool burden in the colon.  Otherwise unremarkable.  Abdomen appears benign on exam.  Since patient is having persistent vomiting patient transferred to inpatient admission to hospital.  ED Course: [name] a direct admit.       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 Constipation improved with Laxatives, patient also had right upper quadrant pain and had a negative gallbladder ultrasound.   Assessment and plan  1)Intractable Nausea and Vomiting in the setting of neurogenic bowel clinically and radiologically patient was constipated, resolved with laxatives, , tolerating solid food well ,  no further emesis, c/n Senna nightly and MiraLAX twice daily to maintain regular BMs  2)Metastatic Thyroid Cancer ((stage IV Hurthle cell carcinoma) to the spine status post recent resection-- c/n Decadron   p.o. 4 mg   bid   3)H/o PE- c/n Xarelto, please note that dx of HIT was not confirmed, patient has tolerated heparin in the past  4)Postoperative Hypothyroidism--stable, continue Thyroid replacement  5)Lt Femur Fx- s/p ORIF- stable, c/n Baclofen nightly,  transfer to rehab (CIR), Please call Dr. Marcelino Scot the orthopedic surgeon to discuss  when patient right lower extremity sutures can come out,   6)Steroid-induced Hyperglycemia--  no frank diabetes, hemoglobin A1c was 5.5 on 08/09/17,  use Novolog/Humalog Sliding scale insulin with Accu-Cheks/Fingersticks as ordered, .   7)Acute on Chronic anemia-stable, multifactorial (recent  surgery/blood loss, chronic anticoagulation, and some nutritional component),  Please  recheck CBC on 09/24/2017 and transfuse as clinically indicated, , no evidence of ongoing bleeding at this time  8)H/o HTN- BP stable without medications at this time, may use amlodipine 2.5 mg daily if BP becomes elevated,    9)Depresion-stable , Zoloft as ordered  10)Myelopathy with paraplegia status post recurrent T3 spinal metastatic tumor resection--- patient needs rehab\  11)Left femoral shaft subacute fracture from recent fall--- patient needs rehab  12) Neurogenic bowel and bladder--- due to metastatic lesions to spine with subsequent surgical resection, patient needs a bowel regimen to avoid constipation-, MiraLAX and Senokot scheduled as ordered, use suppository and enema as needed  Code Status:  Full code   Family Communication: Discussed in detail with the patient, all imaging results, lab results explained to the patient   Disposition Plan:    CIR  rehab  Discharge Condition: stable  Diet and Activity recommendation:  As advised  Discharge Instructions    Discharge Instructions    Call MD for:  difficulty breathing, headache or visual disturbances   Complete by:  As directed    Call MD for:  persistant dizziness or light-headedness   Complete by:  As directed    Call MD for:  persistant nausea and vomiting   Complete by:  As directed    Call MD for:  redness, tenderness, or signs of infection (pain, swelling, redness, odor or green/yellow discharge around incision site)   Complete by:  As directed    Call MD for:  severe uncontrolled pain   Complete by:  As directed    Call MD for:  temperature >100.4   Complete by:  As directed    Diet - low sodium heart healthy   Complete by:  As directed    Diet Carb Modified   Complete by:  As directed    Discharge instructions   Complete by:  As directed    1)Please  recheck CBC on 09/24/2017 and transfuse as clinically  indicated  2)Please call Dr. Marcelino Scot the orthopedic surgeon to discuss  when patient right lower extremity sutures can come out,   3) continue Accu-Cheks with sliding scale insulin coverage AC AND HS as long as patient is on Decadron   Increase activity slowly   Complete by:  As directed         Discharge Medications     Allergies as of 09/21/2017      Reactions   Bee Venom Anaphylaxis   Keflex [cephalexin] Nausea And Vomiting   Tramadol Nausea And Vomiting   Gabapentin Nausea And Vomiting   Heparin Other (See Comments)   Weak positive platelets induced antibodies, SRA negative--2015   Hydrochlorothiazide Nausea And Vomiting   Hydrocodone Nausea And Vomiting   Oxycodone-acetaminophen Nausea And Vomiting      Medication List    STOP taking these medications   lisinopril 20 MG tablet Commonly known as:  PRINIVIL,ZESTRIL   sulfamethoxazole-trimethoprim 800-160 MG tablet Commonly known as:  BACTRIM DS,SEPTRA DS     TAKE these medications   acetaminophen 325 MG tablet Commonly known as:  TYLENOL Take 2 tablets (650 mg total) by mouth every 4 (four) hours as needed for mild pain ((score 1 to 3) or temp > 100.5).   baclofen 20 MG tablet Commonly known as:  LIORESAL Take 1 tablet (20 mg total) by mouth at bedtime. What changed:  See the new instructions.   bisacodyl 10 MG suppository Commonly known as:  DULCOLAX Place 1 suppository (10 mg total) rectally at bedtime.   dexamethasone 4 MG tablet Commonly known as:  DECADRON Take 1 tablet (4 mg total) by mouth 2 (two) times daily. What changed:  when to take this   diazepam 5 MG tablet Commonly known as:  VALIUM Take 1 tablet (5 mg total) by mouth every 6 (six) hours as needed for muscle spasms.   famotidine 20 MG tablet Commonly known as:  PEPCID Take 1 tablet (20 mg total) by mouth 2 (two) times daily.   fluticasone 50 MCG/ACT nasal spray Commonly known as:  FLONASE Place 2 sprays into both nostrils daily.    furosemide 20 MG tablet Commonly known as:  LASIX Take 1 tablet (20 mg total) by mouth daily as needed. What changed:  reasons to take this   HYDROcodone-acetaminophen 5-325 MG tablet Commonly known as:  NORCO/VICODIN Take  1-2 tablets by mouth every 4 (four) hours as needed for moderate pain.   insulin aspart 100 UNIT/ML injection Commonly known as:  novoLOG Inject 0-9 Units into the skin every 4 (four) hours.   levothyroxine 137 MCG tablet Commonly known as:  SYNTHROID, LEVOTHROID Take 137 mcg by mouth daily before breakfast.   ondansetron 4 MG tablet Commonly known as:  ZOFRAN Take 1 tablet (4 mg total) by mouth every 6 (six) hours. What changed:  Another medication with the same name was added. Make sure you understand how and when to take each.   ondansetron 4 MG tablet Commonly known as:  ZOFRAN Take 1 tablet (4 mg total) by mouth every 6 (six) hours as needed for nausea. What changed:  You were already taking a medication with the same name, and this prescription was added. Make sure you understand how and when to take each.   pantoprazole 40 MG tablet Commonly known as:  PROTONIX Take 1 tablet (40 mg total) by mouth daily.   polyethylene glycol packet Commonly known as:  MIRALAX / GLYCOLAX Take 17 g by mouth 2 (two) times daily.   rivaroxaban 20 MG Tabs tablet Commonly known as:  XARELTO Take 20 mg by mouth daily with supper.   senna 8.6 MG Tabs tablet Commonly known as:  SENOKOT Take 2 tablets (17.2 mg total) by mouth at bedtime. What changed:  when to take this   sertraline 25 MG tablet Commonly known as:  ZOLOFT Take 1 tablet (25 mg total) by mouth daily.   traZODone 50 MG tablet Commonly known as:  DESYREL Take 1 tablet (50 mg total) by mouth at bedtime as needed for sleep.       Major procedures and Radiology Reports - PLEASE review detailed and final reports for all details, in brief -   Dg Shoulder Right  Result Date: 09/02/2017 CLINICAL  DATA:  Pain following fall EXAM: RIGHT SHOULDER - 2+ VIEW COMPARISON:  None. FINDINGS: Frontal and Y scapular images were obtained. There is no appreciable fracture or dislocation. There is moderate generalized osteoarthritic change. No erosive change or intra-articular calcification. Visualized right lung is clear. Postoperative changes present in the upper thoracic region. 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 No fracture or dislocation. Electronically Signed   By: Lowella Grip III M.D.   On: 09/02/2017 14:04   Dg Knee 1-2 Views Left  Result Date: 09/10/2017 CLINICAL DATA:  Pain LEFT knee, recent fall on 09/02/2017 EXAM: LEFT KNEE - 1-2 VIEW COMPARISON:  None FINDINGS: Osseous demineralization. Joint spaces preserved. Oblique fracture of the distal LEFT femoral metadiaphysis mildly displaced medially and posteriorly. No additional fracture, dislocation or bone destruction. No knee joint effusion. IMPRESSION: Displaced oblique distal LEFT femoral metadiaphyseal fracture. Findings called to [facility] on 09/10/2017 at 1317 hours. Electronically Signed   By: Lavonia Dana M.D.   On: 09/10/2017 13:17   Dg Abd 1 View  Result Date: 09/15/2017 CLINICAL DATA:  Abdominal pain, nausea and vomiting EXAM: ABDOMEN - 1 VIEW COMPARISON:  08/09/2017 CT abdomen/pelvis FINDINGS: No dilated small bowel loops. Moderate colorectal stool volume. No evidence of pneumatosis or pneumoperitoneum. No radiopaque nephrolithiasis. Mild levocurvature of the lumbar spine. Marked lumbar spine degenerative changes. Partially visualized surgical fixation hardware in the proximal left femur. IMPRESSION: Nonobstructive bowel gas pattern. Moderate colorectal stool volume, which may indicate constipation. Electronically Signed   By: Ilona Sorrel M.D.   On: 09/15/2017 22:33   Ct Head  Wo Contrast  Result Date: 09/02/2017 CLINICAL DATA:  Status post fall moving from a bed to a wheelchair  today. Initial encounter. EXAM: CT HEAD WITHOUT CONTRAST CT CERVICAL SPINE WITHOUT CONTRAST TECHNIQUE: Multidetector CT imaging of the head and cervical spine was performed following the standard protocol without intravenous contrast. Multiplanar CT image reconstructions of the cervical spine were also generated. COMPARISON:  Head CT scan 12/22/2016.  PET CT scan 05/20/2017. FINDINGS: CT HEAD FINDINGS Brain: No evidence of acute infarction, hemorrhage, hydrocephalus, extra-axial collection or mass lesion/mass effect. Vascular: A few atherosclerotic calcifications are seen. Skull: Intact.  No focal lesion. Sinuses/Orbits: Mild mucosal thickening is noted in the maxillary sinuses, frontal sinuses, sphenoid sinus and scattered ethmoid air cells. Other: None. CT CERVICAL SPINE FINDINGS Alignment: Maintained. Skull base and vertebrae: No acute fracture. No primary bone lesion or focal pathologic process. Soft tissues and spinal canal: No prevertebral fluid or swelling. No visible canal hematoma. Disc levels: Intervertebral disc space height is maintained. There is some endplate spurring most notable at C3-4 and C5-6. Partial visualization of thoracic fusion hardware noted. Upper chest: 0.5 cm left upper lobe pulmonary nodule is seen on prior PET CT scan is noted. Other: None. IMPRESSION: No acute abnormality head or cervical spine. Atherosclerosis. Sinus disease. Mild appearing cervical spondylosis. No change in 0.5 cm left upper lobe pulmonary nodule consistent with the patient's known metastatic thyroid carcinoma. Electronically Signed   By: Inge Rise M.D.   On: 09/02/2017 14:37   Ct Cervical Spine Wo Contrast  Result Date: 09/02/2017 CLINICAL DATA:  Status post fall moving from a bed to a wheelchair today. Initial encounter. EXAM: CT HEAD WITHOUT CONTRAST CT CERVICAL SPINE WITHOUT CONTRAST TECHNIQUE: Multidetector CT imaging of the head and cervical spine was performed following the standard protocol  without intravenous contrast. Multiplanar CT image reconstructions of the cervical spine were also generated. COMPARISON:  Head CT scan 12/22/2016.  PET CT scan 05/20/2017. FINDINGS: CT HEAD FINDINGS Brain: No evidence of acute infarction, hemorrhage, hydrocephalus, extra-axial collection or mass lesion/mass effect. Vascular: A few atherosclerotic calcifications are seen. Skull: Intact.  No focal lesion. Sinuses/Orbits: Mild mucosal thickening is noted in the maxillary sinuses, frontal sinuses, sphenoid sinus and scattered ethmoid air cells. Other: None. CT CERVICAL SPINE FINDINGS Alignment: Maintained. Skull base and vertebrae: No acute fracture. No primary bone lesion or focal pathologic process. Soft tissues and spinal canal: No prevertebral fluid or swelling. No visible canal hematoma. Disc levels: Intervertebral disc space height is maintained. There is some endplate spurring most notable at C3-4 and C5-6. Partial visualization of thoracic fusion hardware noted. Upper chest: 0.5 cm left upper lobe pulmonary nodule is seen on prior PET CT scan is noted. Other: None. IMPRESSION: No acute abnormality head or cervical spine. Atherosclerosis. Sinus disease. Mild appearing cervical spondylosis. No change in 0.5 cm left upper lobe pulmonary nodule consistent with the patient's known metastatic thyroid carcinoma. Electronically Signed   By: Inge Rise M.D.   On: 09/02/2017 14:37   Ct Thoracic Spine Wo Contrast  Result Date: 09/09/2017 CLINICAL DATA:  Neck hematoma, traumatic. Bone neoplasm. Check for local recurrence EXAM: CT THORACIC SPINE WITHOUT CONTRAST TECHNIQUE: Multidetector CT images of the thoracic were obtained using the standard protocol without intravenous contrast. COMPARISON:  Thoracic MRI 09/02/2017.  Thoracic spine CT 09/03/2017 FINDINGS: Alignment: Stable alignment with mild exaggerated thoracic kyphosis. Vertebrae: There is known malignant erosion of the T3, T2, and T4 bodies with chronic T3  collapse. Status post remote laminectomy with  more recent reopening for recurrent tumor excision. There is posterior hardware spanning T1-T5, which is well seated. No progressive erosion or interval fracture seen. Paraspinal and other soft tissues: High-density material within the incision, both subcutaneous and deep, measuring up to 16 mm thickness, 8 cm craniocaudal, and 7 cm in depth. The fluid is high-density compatible with blood products. At the level of the laminectomy, mass effect on the thecal sac would not be detectable by this technique. Degree of residual tumor is also not well established by this technique. Disc levels: Diffuse spondylosis with bridging osteophytes from T4 to T12. There are small calcified disc protrusions at T7-8 and T8-9. IMPRESSION: Interval T3 spinal canal tumor debulking with hemolymphatic material in the subcutaneous and deep incision measuring 40-50 cc. No detected change in osseous structures or posterior hardware. Electronically Signed   By: Monte Fantasia M.D.   On: 09/09/2017 12:51   Ct Thoracic Spine Wo Contrast  Result Date: 09/03/2017 CLINICAL DATA:  Metastatic Hurthle cell carcinoma to the thoracic spine. T3 metastasis with prior decompression. Worsening disease with cord compression on recent MRI. EXAM: CT THORACIC SPINE WITHOUT CONTRAST TECHNIQUE: Multidetector CT images of the thoracic were obtained using the standard protocol without intravenous contrast. COMPARISON:  Thoracic spine MRI 09/02/2017 FINDINGS: Alignment: Mildly exaggerated upper thoracic kyphosis. No listhesis. Vertebrae: Prior posterior fusion from T1-T5. Bilateral pedicle screws at each level except for T3. The screws appear intact without evidence of loosening about the T1, T2, or T5 screws. There is lucency throughout the posterosuperior aspect of the T4 vertebral body including about the distal portions of both T4 screws reflecting tumor as seen on MRI. There is a chronic, severe to pathologic  T3 compression fracture. Extensive epidural tumor centered at the T3 level was more fully evaluated on yesterday's MRI. Tumor is again noted to involve the posterior and inferior aspects of the T2 vertebral body. There is slight chronic anterior wedging of the T8 vertebral body. No definite acute fracture is identified. Flowing anterior vertebral osteophytes are present throughout the thoracic spine extending into the lumbar spine. Paraspinal and other soft tissues: Unremarkable. Disc levels: Spondylosis more fully evaluated on yesterday's MRI, with a small calcified disc extrusion noted at T7-8. IMPRESSION: 1. Metastatic disease as described on yesterday's MRI with extensive osseous and epidural tumor from T2-T4. Chronic pathologic T3 compression fracture. 2. Prior T1-T5 posterior fusion. Tumor involves the T4 vertebral body about the distal aspects of both pedicle screws. Electronically Signed   By: Logan Bores M.D.   On: 09/03/2017 15:13   Ct Lumbar Spine Wo Contrast  Result Date: 09/09/2017 CLINICAL DATA:  Hematoma, neck.  Drop in hematocrit. EXAM: CT LUMBAR SPINE WITHOUT CONTRAST TECHNIQUE: Multidetector CT imaging of the lumbar spine was performed without intravenous contrast administration. Multiplanar CT image reconstructions were also generated. COMPARISON:  09/02/2017 MRI FINDINGS: Segmentation: 5 lumbar type vertebral bodies. Alignment: Mild levoscoliosis. Vertebrae: No evidence of fracture, discitis, or aggressive bone lesion. Paraspinal and other soft tissues: Negative for hematoma. Disc levels: Congenitally narrow spinal canal with bulky spondylitic spurring and generalized degenerative facet hypertrophy throughout the lumbar levels. The spinal canal and foramina are better assessed by recent MRI. IMPRESSION: 1. Negative for hematoma. 2. Bulky spondylosis and facet arthropathy throughout the lumbar spine, with spinal stenosis exacerbated by short pedicles. See MRI from 1 week prior. Electronically  Signed   By: Monte Fantasia M.D.   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 EXAM: CT PELVIS WITHOUT CONTRAST TECHNIQUE: Multidetector CT imaging of the pelvis was performed following the standard protocol without intravenous contrast. COMPARISON:  Lumbar spine CT today reported separately. CT Abdomen and Pelvis 08/09/2017. FINDINGS: Urinary Tract: Foley catheter in place. The urinary bladder is decompressed. The visible ureters are decompressed and unremarkable. Bowel: Retained stool in the rectosigmoid colon similar to the CT last month. Visible large and small bowel loops are nondilated. Vascular/Lymphatic: Vascular patency is not evaluated in the absence of IV contrast. No lymphadenopathy. Reproductive:  Negative. Other: No pelvic free fluid. There is increased nonspecific presacral stranding. See series 4, image 32. Since May there is new widespread nonspecific subcutaneous fat stranding throughout the lateral abdominal wall and proximal lower extremities bilaterally, but greater on the left at the level of the proximal thigh as seen on series 4, image 60. no soft tissue fluid collection in these regions. Musculoskeletal: Bulky degenerative osteophytosis in the visible lower lumbar spine and about the hips, greater on the right. Bone mineralization remains within normal limits. There is no acute, destructive, or suspicious osseous lesion identified. IMPRESSION: 1. Unchanged pelvic osseous structures since May with no acute or suspicious osseous lesion identified. 2. Nonspecific bilateral lateral abdominal wall, presacral, flank, and proximal thigh subcutaneous edema/stranding. The left proximal thigh is asymmetrically more affected as seen on series 4, image 60. Query cellulitis or anasarca. Electronically Signed   By: Genevie Ann M.D.   On: 09/09/2017 21:17   Mr Lumbar Spine Wo  Contrast  Result Date: 09/02/2017 CLINICAL DATA:  Initial evaluation for increased lower extremity weakness with history of metastatic Hurthle cell carcinoma to the thoracic spine. T3 metastasis with history of prior decompression. EXAM: MRI THORACIC AND LUMBAR SPINE WITHOUT AND WITH CONTRAST TECHNIQUE: Multiplanar and multiecho pulse sequences of the thoracic and lumbar spine were obtained without and with intravenous contrast. CONTRAST:  71mL MULTIHANCE GADOBENATE DIMEGLUMINE 529 MG/ML IV SOLN COMPARISON:  Previous MRI from 12/25/2015. FINDINGS: MRI THORACIC SPINE FINDINGS Alignment: Examination mildly limited as the patient was unable to tolerate the full length of the exam. Postcontrast axial sequences as well as post-contrast imaging through the lumbar spine was not performed. Stable alignment with preservation of the normal thoracic kyphosis. No listhesis. Vertebrae: Postoperative changes from prior fusion from T1 through T5 for T3 decompression again seen. Pathologic fracture deformity of T3 is relatively unchanged in appearance. Interval growth with progressive tumor seen within the adjacent spinal canal, extending from the mid aspect of T2 through the mid aspect of T4 (series 7, image 8). There has been marked interval worsening of cord and thecal sac progression, with the sac compressed laterally and slit like in appearance, measuring approximately 2 mm in transverse diameter at its most narrow point (series 10, image 12). Suspected cord signal abnormality at this level, although poorly visualized due to susceptibility artifact from adjacent hardware. Postoperative fluid collection again noted at the adjacent laminectomy site, likely slightly decreased in size. No other metastatic lesions identified. Reactive marrow edema and enhancement about the right T12-L1 facet favored to be degenerative in nature (series 6, image 4). Vertebral body heights otherwise maintained. Mild discogenic reactive endplate  changes present at the thoracolumbar junction. Cord:  No other cord signal abnormality identified. Paraspinal and other soft tissues: No other acute paraspinous soft tissue abnormality. Probable mild cholelithiasis noted. Visualized visceral structures otherwise unremarkable. Disc levels: T7-8: Central disc extrusion flattens the ventral thoracic spinal cord and resultant  moderate spinal stenosis, stable. Left paracentral disc protrusion at T8-9 with mild flattening of the left hemi cord also unchanged. MRI LUMBAR SPINE FINDINGS Segmentation: Normal segmentation. Lowest well-formed disc labeled the L5-S1 level. Alignment: Levoscoliosis. Vertebral bodies otherwise normally aligned with preservation of the normal lumbar lordosis. No listhesis. Vertebrae: Mild height loss with reactive endplate edema at the left aspect of the superior endplate of L4, suspicious for mild compression fracture (series 14, image 12). No bony retropulsion. This is benign in appearance, with no underlying lesion identified. No worrisome osseous lesions or evidence for metastatic disease identified within the lumbar spine. Prominent reactive marrow edema about the left L3-4 facet due to advanced facet degeneration. Conus medullaris: Extends to the L1 level and appears normal. Paraspinal and other soft tissues: Paraspinous soft tissues within normal limits. Visualized visceral structures unremarkable. Disc levels: L1-2:  Mild facet and ligament flavum hypertrophy.  No stenosis. L2-3: Minimal disc bulge with bilateral facet hypertrophy. No significant stenosis. L3-4: Disc desiccation with minimal disc bulge. Prominent anterior bridging osteophyte. Advanced bilateral facet degeneration with associated reactive marrow edema, left greater than right. 7 mm synovial cyst noted at the posterior aspect of the right L3-4 facet. Mild canal with bilateral L3 foraminal stenosis. L4-5: Chronic intervertebral disc mild diffuse disc bulge. Moderate to  advanced facet arthrosis, left greater than right. Resultant moderate canal with bilateral subarticular stenosis. Foramina remain patent. L5-S1: Chronic intervertebral disc space narrowing with diffuse disc bulge. Left greater than right facet degeneration. Resultant moderate to severe left lateral recess narrowing. Mild right with mild to moderate left L5 foraminal stenosis. IMPRESSION: MRI THORACIC SPINE IMPRESSION 1. 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 Neuro surgical consultation recommended. 2. Superimposed sequelae of prior T1 through T5 fusion for T3 decompression. 3. Stable disc protrusions at T7-8 and T8-9. 4. Prominent facet arthropathy at T12-L1 on the right with associated reactive marrow edema. MRI LUMBAR SPINE IMPRESSION 1. No evidence for metastatic disease within the lumbar spine. 2. Mild acute to subacute endplate compression deformity involving the left aspect of the superior endplate of L4. This is benign in appearance with no underlying pathologic lesion. 3. Advanced bilateral facet degeneration at L3-4 with associated reactive marrow edema, left greater than right. 4. Multilevel degenerative spondylolysis as above, most notable at L4-5 and L5-S1. Resultant moderate canal stenosis at L4-5, with moderate to severe left lateral recess narrowing at L5-S1. Critical Value/emergent results were called by telephone at the time of interpretation on 09/02/2017 at 8:30 pm to the ER PA [place] , who verbally acknowledged these results. Electronically Signed   By: Jeannine Boga M.D.   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 EXAM: MRI THORACIC AND LUMBAR SPINE  WITHOUT AND WITH CONTRAST TECHNIQUE: Multiplanar and multiecho pulse sequences of the thoracic and lumbar spine were obtained without and with intravenous contrast. CONTRAST:  41mL MULTIHANCE GADOBENATE DIMEGLUMINE 529 MG/ML IV SOLN COMPARISON:  Previous MRI from 12/25/2015. FINDINGS: MRI THORACIC SPINE FINDINGS Alignment: Examination mildly limited as the patient was unable to tolerate the full length of the exam. Postcontrast axial sequences as well as post-contrast imaging through the lumbar spine was not performed. Stable alignment with preservation of the normal thoracic kyphosis.  No listhesis. Vertebrae: Postoperative changes from prior fusion from T1 through T5 for T3 decompression again seen. Pathologic fracture deformity of T3 is relatively unchanged in appearance. Interval growth with progressive tumor seen within the adjacent spinal canal, extending from the mid aspect of T2 through the mid aspect of T4 (series 7, image 8). There has been marked interval worsening of cord and thecal sac progression, with the sac compressed laterally and slit like in appearance, measuring approximately 2 mm in transverse diameter at its most narrow point (series 10, image 12). Suspected cord signal abnormality at this level, although poorly visualized due to susceptibility artifact from adjacent hardware. Postoperative fluid collection again noted at the adjacent laminectomy site, likely slightly decreased in size. No other metastatic lesions identified. Reactive marrow edema and enhancement about the right T12-L1 facet favored to be degenerative in nature (series 6, image 4). Vertebral body heights otherwise maintained. Mild discogenic reactive endplate changes present at the thoracolumbar junction. Cord:  No other cord signal abnormality identified. Paraspinal and other soft tissues: No other acute paraspinous soft tissue abnormality. Probable mild cholelithiasis noted. Visualized visceral structures otherwise  unremarkable. Disc levels: T7-8: Central disc extrusion flattens the ventral thoracic spinal cord and resultant moderate spinal stenosis, stable. Left paracentral disc protrusion at T8-9 with mild flattening of the left hemi cord also unchanged. MRI LUMBAR SPINE FINDINGS Segmentation: Normal segmentation. Lowest well-formed disc labeled the L5-S1 level. Alignment: Levoscoliosis. Vertebral bodies otherwise normally aligned with preservation of the normal lumbar lordosis. No listhesis. Vertebrae: Mild height loss with reactive endplate edema at the left aspect of the superior endplate of L4, suspicious for mild compression fracture (series 14, image 12). No bony retropulsion. This is benign in appearance, with no underlying lesion identified. No worrisome osseous lesions or evidence for metastatic disease identified within the lumbar spine. Prominent reactive marrow edema about the left L3-4 facet due to advanced facet degeneration. Conus medullaris: Extends to the L1 level and appears normal. Paraspinal and other soft tissues: Paraspinous soft tissues within normal limits. Visualized visceral structures unremarkable. Disc levels: L1-2:  Mild facet and ligament flavum hypertrophy.  No stenosis. L2-3: Minimal disc bulge with bilateral facet hypertrophy. No significant stenosis. L3-4: Disc desiccation with minimal disc bulge. Prominent anterior bridging osteophyte. Advanced bilateral facet degeneration with associated reactive marrow edema, left greater than right. 7 mm synovial cyst noted at the posterior aspect of the right L3-4 facet. Mild canal with bilateral L3 foraminal stenosis. L4-5: Chronic intervertebral disc mild diffuse disc bulge. Moderate to advanced facet arthrosis, left greater than right. Resultant moderate canal with bilateral subarticular stenosis. Foramina remain patent. L5-S1: Chronic intervertebral disc space narrowing with diffuse disc bulge. Left greater than right facet degeneration. Resultant  moderate to severe left lateral recess narrowing. Mild right with mild to moderate left L5 foraminal stenosis. IMPRESSION: MRI THORACIC SPINE IMPRESSION 1. 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 Neuro surgical consultation recommended. 2. Superimposed sequelae of prior T1 through T5 fusion for T3 decompression. 3. Stable disc protrusions at T7-8 and T8-9. 4. Prominent facet arthropathy at T12-L1 on the right with associated reactive marrow edema. MRI LUMBAR SPINE IMPRESSION 1. No evidence for metastatic disease within the lumbar spine. 2. Mild acute to subacute endplate compression deformity involving the left aspect of the superior endplate of L4. This is benign in appearance with no underlying pathologic lesion. 3. Advanced bilateral facet degeneration at L3-4 with associated reactive  marrow edema, left greater than right. 4. Multilevel degenerative spondylolysis as above, most notable at L4-5 and L5-S1. Resultant moderate canal stenosis at L4-5, with moderate to severe left lateral recess narrowing at L5-S1. Critical Value/emergent results were called by telephone at the time of interpretation on 09/02/2017 at 8:30 pm to the ER PA [place] , who verbally acknowledged these results. Electronically Signed   By: Jeannine Boga M.D.   On: 09/02/2017 20:31   Dg C-arm 1-60 Min  Result Date: 09/11/2017 CLINICAL DATA:  Intramedullary femoral nailing. EXAM: DG C-ARM 61-120 MIN; LEFT FEMUR 2 VIEWS FLUOROSCOPY TIME:  1 minutes, 41 seconds. C-arm fluoroscopic images were obtained intraoperatively and submitted for post operative interpretation. COMPARISON:  Left femur x-rays from yesterday. FINDINGS: Multiple intraoperative x-rays demonstrate interval retrograde intramedullary rod placement of the femur with proximal and distal interlocking screws. No hardware complication. Improved alignment of the oblique distal  femoral fracture with persistent slight medial displacement. IMPRESSION: Left distal femur fracture fixation with intramedullary rod. Electronically Signed   By: Titus Dubin M.D.   On: 09/11/2017 12:55   Dg C-arm 1-60 Min  Result Date: 09/11/2017 CLINICAL DATA:  Intramedullary femoral nailing. EXAM: DG C-ARM 61-120 MIN; LEFT FEMUR 2 VIEWS FLUOROSCOPY TIME:  1 minutes, 41 seconds. C-arm fluoroscopic images were obtained intraoperatively and submitted for post operative interpretation. COMPARISON:  Left femur x-rays from yesterday. FINDINGS: Multiple intraoperative x-rays demonstrate interval retrograde intramedullary rod placement of the femur with proximal and distal interlocking screws. No hardware complication. Improved alignment of the oblique distal femoral fracture with persistent slight medial displacement. IMPRESSION: Left distal femur fracture fixation with intramedullary rod. Electronically Signed   By: Titus Dubin M.D.   On: 09/11/2017 12:55   Dg Femur Min 2 Views Left  Result Date: 09/11/2017 CLINICAL DATA:  Intramedullary femoral nailing. EXAM: DG C-ARM 61-120 MIN; LEFT FEMUR 2 VIEWS FLUOROSCOPY TIME:  1 minutes, 41 seconds. C-arm fluoroscopic images were obtained intraoperatively and submitted for post operative interpretation. COMPARISON:  Left femur x-rays from yesterday. FINDINGS: Multiple intraoperative x-rays demonstrate interval retrograde intramedullary rod placement of the femur with proximal and distal interlocking screws. No hardware complication. Improved alignment of the oblique distal femoral fracture with persistent slight medial displacement. IMPRESSION: Left distal femur fracture fixation with intramedullary rod. Electronically Signed   By: Titus Dubin M.D.   On: 09/11/2017 12:55   Dg Femur Port Min 2 Views Left  Result Date: 09/11/2017 CLINICAL DATA:  Fracture. INTRAMEDULLARY (IM) RETROGRADE FEMORAL NAILING PERFORMED EARLIER TODAY. EXAM: LEFT FEMUR PORTABLE 2  VIEWS COMPARISON:  CT of the pelvis on 09/09/2017 FINDINGS: Status post placement of intramedullary nail across oblique distal femur fracture. Cortical screws transfix the proximal and distal aspects. There is deformity of the LEFT inferior pubic ramus consistent with old injury. Degenerative changes are seen at the acetabulum. IMPRESSION: IM nail of the LEFT femur. Electronically Signed   By: Nolon Nations M.D.   On: 09/11/2017 16:45   Dg Femur Port Min 2 Views Left  Result Date: 09/11/2017 CLINICAL DATA:  Status post ORIF of left femoral fracture EXAM: LEFT FEMUR PORTABLE 2 VIEWS COMPARISON:  09/10/2017 FINDINGS: Medullary rod is now noted in place. The distal femoral fracture is again identified in near anatomic alignment. No other focal abnormality is seen. IMPRESSION: Distal femoral fracture with medullary rod in place. Electronically Signed   By: Inez Catalina M.D.   On: 09/11/2017 14:54   Dg Femur Port Min 2 Views Left  Result Date:  09/10/2017 CLINICAL DATA:  52 y/o  F; left femur fracture. EXAM: LEFT FEMUR PORTABLE 2 VIEWS COMPARISON:  09/10/2017 left knee radiographs FINDINGS: Acute oblique extra-articular fracture of the distal left femur metadiaphysis with 1/2 shaft's width lateral displacement of the shaft relative to distal component and mild dorsal apex angulation. Knee joint is maintained. No knee joint effusion. Bones are diffusely demineralized. IMPRESSION: Acute oblique extra-articular fracture of the distal left femur metadiaphysis with 1/2 shaft's width lateral displacement of the shaft relative to distal component and mild dorsal apex angulation. Electronically Signed   By: Kristine Garbe M.D.   On: 09/10/2017 22:37   US Abdomen Limited Ruq  Result Date: 09/16/2017 CLINICAL DATA:  Nausea vomiting for 1 week, history of cholelithiasis EXAM: ULTRASOUND ABDOMEN LIMITED RIGHT UPPER QUADRANT COMPARISON:  08/10/2017 FINDINGS: Gallbladder: Gallbladder is well distended with  evidence of cholelithiasis. No gallbladder wall thickening or pericholecystic fluid is noted. Common bile duct: Diameter: 5.5 mm. Liver: Increased coarsened echotexture consistent with fatty infiltration. Portal vein is patent on color Doppler imaging with normal direction of blood flow towards the liver. IMPRESSION: Cholelithiasis without complicating factors. Changes consistent with fatty infiltration of the liver. Electronically Signed   By: Inez Catalina M.D.   On: 09/16/2017 16:33    Micro Results   No results found for this or any previous visit (from the past 240 hour(s)).     Today   Subjective    Sarah [name] today has no new           Patient has been seen and examined prior to discharge   Objective   Blood pressure 130/90, pulse 77, temperature 97.9 F (36.6 C), resp. rate 18, height 5\' 11"  (1.803 m), weight 107.9 kg (237 lb 14 oz), last menstrual period 10/22/2016, SpO2 100 %.   Intake/Output Summary (Last 24 hours) at 09/21/2017 1618 Last data filed at 09/21/2017 1504 Gross per 24 hour  Intake 1469.9 ml  Output 900 ml  Net 569.9 ml   Phy Exam  Gen:- Awake Alert,  In no apparent distress  HEENT:- [place].AT, No sclera icterus Neck-Supple Neck,No JVD,. Thyroid scar Lungs-  CTAB , good air movement CV- S1, S2 normal Abd-  +ve B.Sounds, Abd Soft, No tenderness,    Extremity/Skin:- Left foot bruising /echymosis, sutures intact Psych-affect is appropriate, oriented x3 Neuro-bilateral lower extremity weakness, no tremors     Data Review   CBC w Diff:  Lab Results  Component Value Date   WBC 5.7 09/20/2017   HGB 8.4 (L) 09/20/2017   HGB 10.8 (L) 08/24/2017   HGB 10.7 (L) 08/21/2017   HGB 12.7 01/02/2017   HCT 26.6 (L) 09/20/2017   HCT 33.0 (L) 08/21/2017   HCT 37.4 01/02/2017   PLT 158 09/20/2017   PLT 320 08/24/2017   PLT 328 08/21/2017   LYMPHOPCT 7 09/15/2017   LYMPHOPCT 35.3 01/02/2017   MONOPCT 6 09/15/2017   MONOPCT 7.5 01/02/2017   EOSPCT 0  09/15/2017   EOSPCT 2.0 01/02/2017   BASOPCT 0 09/15/2017   BASOPCT 0.3 01/02/2017    CMP:  Lab Results  Component Value Date   NA 135 09/20/2017   NA 145 (H) 08/21/2017   NA 143 01/02/2017   K 4.3 09/20/2017   K 4.0 01/02/2017   CL 103 09/20/2017   CO2 27 09/20/2017   CO2 25 01/02/2017   BUN 20 09/20/2017   BUN 7 08/21/2017   BUN 11.9 01/02/2017   CREATININE 0.83 09/20/2017   CREATININE  1.00 08/24/2017   CREATININE 0.9 01/02/2017   PROT 4.4 (L) 09/18/2017   PROT 5.4 (L) 08/21/2017   PROT 6.3 (L) 01/02/2017   ALBUMIN 2.5 (L) 09/18/2017   ALBUMIN 3.0 (L) 08/21/2017   ALBUMIN 3.2 (L) 01/02/2017   BILITOT 1.7 (H) 09/18/2017   BILITOT 0.6 08/24/2017   BILITOT 1.06 01/02/2017   ALKPHOS 132 (H) 09/18/2017   ALKPHOS 126 01/02/2017   AST 18 09/18/2017   AST 39 (H) 08/24/2017   AST 24 01/02/2017   ALT 15 09/18/2017   ALT 64 (H) 08/24/2017   ALT 12 01/02/2017  .   Total Discharge time is about 33 minutes  Roxan Hockey M.D on 09/21/2017 at 4:18 PM  [facility]   Office  [phone]  Voice Recognition Viviann Spare dictation system was used to create this note, attempts have been made to correct errors. Please contact the author with questions and/or clarifications.

## 2017-09-21 NOTE — Progress Notes (Signed)
Inpatient Rehabilitation Admissions Coordinator  I have an inpt rehab bed today and insurance approval to admit today. I contacted Dr. Denton Brick and will make the arrangements to admit today. SW made aware.  Danne Baxter, RN, MSN Rehab Admissions Coordinator [phone] 09/21/2017 3:04 PM

## 2017-09-21 NOTE — Progress Notes (Signed)
Patient received at 1654 alert and oriented x 4 . No complaint of pain. Patient oriented to room and call bell system. Patient verbalized understanding of rehab process from previous admissions . Continue with plan of care.  Mliss Sax

## 2017-09-21 NOTE — Progress Notes (Signed)
Report called to receiving nurse on [place] changed, and then patient transported to [id number].

## 2017-09-22 ENCOUNTER — Other Ambulatory Visit: Payer: Medicare Other

## 2017-09-22 ENCOUNTER — Inpatient Hospital Stay (HOSPITAL_COMMUNITY): Payer: Medicare Other

## 2017-09-22 ENCOUNTER — Inpatient Hospital Stay (HOSPITAL_COMMUNITY): Payer: Medicare Other | Admitting: Physical Therapy

## 2017-09-22 ENCOUNTER — Inpatient Hospital Stay (HOSPITAL_COMMUNITY): Payer: Medicare Other | Admitting: Occupational Therapy

## 2017-09-22 DIAGNOSIS — G8221 Paraplegia, complete: Secondary | ICD-10-CM

## 2017-09-22 DIAGNOSIS — G959 Disease of spinal cord, unspecified: Secondary | ICD-10-CM

## 2017-09-22 DIAGNOSIS — N319 Neuromuscular dysfunction of bladder, unspecified: Secondary | ICD-10-CM

## 2017-09-22 LAB — COMPREHENSIVE METABOLIC PANEL
ALT: 15 U/L (ref 0–44)
AST: 17 U/L (ref 15–41)
Albumin: 2.7 g/dL — ABNORMAL LOW (ref 3.5–5.0)
Alkaline Phosphatase: 129 U/L — ABNORMAL HIGH (ref 38–126)
Anion gap: 6 (ref 5–15)
BUN: 17 mg/dL (ref 6–20)
CHLORIDE: 100 mmol/L (ref 98–111)
CO2: 29 mmol/L (ref 22–32)
CREATININE: 0.86 mg/dL (ref 0.44–1.00)
Calcium: 8 mg/dL — ABNORMAL LOW (ref 8.9–10.3)
Glucose, Bld: 97 mg/dL (ref 70–99)
POTASSIUM: 4.7 mmol/L (ref 3.5–5.1)
SODIUM: 135 mmol/L (ref 135–145)
TOTAL PROTEIN: 4.5 g/dL — AB (ref 6.5–8.1)
Total Bilirubin: 2.7 mg/dL — ABNORMAL HIGH (ref 0.3–1.2)

## 2017-09-22 LAB — CBC WITH DIFFERENTIAL/PLATELET
ABS IMMATURE GRANULOCYTES: 0.1 10*3/uL (ref 0.0–0.1)
BASOS ABS: 0 10*3/uL (ref 0.0–0.1)
Basophils Relative: 0 %
EOS ABS: 0 10*3/uL (ref 0.0–0.7)
Eosinophils Relative: 0 %
HCT: 26.8 % — ABNORMAL LOW (ref 36.0–46.0)
Hemoglobin: 8.6 g/dL — ABNORMAL LOW (ref 12.0–15.0)
IMMATURE GRANULOCYTES: 2 %
Lymphocytes Relative: 18 %
Lymphs Abs: 0.9 10*3/uL (ref 0.7–4.0)
MCH: 31 pg (ref 26.0–34.0)
MCHC: 32.1 g/dL (ref 30.0–36.0)
MCV: 96.8 fL (ref 78.0–100.0)
MONO ABS: 0.2 10*3/uL (ref 0.1–1.0)
Monocytes Relative: 4 %
NEUTROS ABS: 3.6 10*3/uL (ref 1.7–7.7)
NEUTROS PCT: 76 %
PLATELETS: 143 10*3/uL — AB (ref 150–400)
RBC: 2.77 MIL/uL — ABNORMAL LOW (ref 3.87–5.11)
RDW: 19.3 % — AB (ref 11.5–15.5)
WBC: 4.7 10*3/uL (ref 4.0–10.5)

## 2017-09-22 LAB — GLUCOSE, CAPILLARY
GLUCOSE-CAPILLARY: 139 mg/dL — AB (ref 70–99)
Glucose-Capillary: 104 mg/dL — ABNORMAL HIGH (ref 70–99)
Glucose-Capillary: 125 mg/dL — ABNORMAL HIGH (ref 70–99)
Glucose-Capillary: 128 mg/dL — ABNORMAL HIGH (ref 70–99)

## 2017-09-22 MED ORDER — SUCRALFATE 1 GM/10ML PO SUSP
1.0000 g | Freq: Three times a day (TID) | ORAL | Status: DC
  Administered 2017-09-22 – 2017-10-30 (×137): 1 g via ORAL
  Filled 2017-09-22 (×154): qty 10

## 2017-09-22 NOTE — Evaluation (Signed)
Physical Therapy Assessment and Plan  Patient Details  Name: Sarah [name] MRN: [id number] Date of Birth: [birth year]  PT Diagnosis: Abnormal posture, Coordination disorder, Hypertonia, Impaired sensation and Paraplegia Rehab Potential: Good ELOS: 18-21   Today's Date: 09/22/2017 PT Individual Time: 1030-1200 PT Individual Time Calculation (min): 90 min    Problem List:  Patient Active Problem List   Diagnosis Date Noted  . Myelopathy ([place]) 09/21/2017  . Muscle spasms of both lower extremities   . Closed fracture of shaft of left femur ([place])   . Acute blood loss anemia   . Hypothyroidism   . [name] & vomiting 09/15/2017  . Closed fracture of left distal femur ([place]) 09/11/2017  . Paraplegia ([place]) 09/08/2017  . Obesity (BMI 30-39.9) 09/03/2017  . Status post surgery 09/03/2017  . Calculus of gallbladder without cholecystitis without obstruction   . Biliary dyskinesia   . Intractable [name] and vomiting 08/09/2017  . Thrombocytopenia ([place]) 08/09/2017  . Elevated bilirubin 08/09/2017  . AKI (acute kidney injury) ([place]) 08/09/2017  . Nasal congestion 04/05/2017  . Breast pain 01/26/2017  . Upper respiratory infection, viral 01/26/2017  . Counseling regarding advanced care planning and goals of care 01/02/2017  . Bilateral rotator cuff syndrome 02/12/2016  . E. coli UTI 08/01/2015  . Constipation due to neurogenic bowel 08/01/2015  . Muscle spasticity   . 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 Spastic paraparesis   . Neurogenic bladder   . History of pulmonary embolism   . Post-operative pain   . Metastatic cancer ([name])   . Steroid-induced hyperglycemia   . Depression   . Obstipation   . Thoracic spine tumor 07/06/2015  . 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 ([place]) 07/06/2015  . Spinal cord compression due to malignant neoplasm metastatic to spine ([place])   . Postoperative hypothyroidism   . 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 Spasticity  09/19/2014  . Dysuria 09/04/2014  . Hypertension 05/01/2014  . Ingrown right big toenail 05/01/2014  . GERD (gastroesophageal reflux disease) 02/14/2014  . Dysphagia, pharyngoesophageal phase 02/06/2014  . Type 2 diabetes mellitus without complication ([place]) 01/07/5101  . Constipation   . Neurogenic bowel   . Paraplegia at T4 level ([place]) 01/20/2014  . Metastatic cancer to spine ([place]) 01/19/2014  . Rotator cuff tendonitis   . 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 Leg weakness, bilateral 12/13/2013    Past Medical History:  Past Medical History:  Diagnosis Date  . Cancer [facility])    FNA of thyroid positive for onconytic/hurthle cell carcinoma  . Chronic back pain   . DDD (degenerative disc disease), cervical   . DJD (degenerative joint disease)   . Family history of adverse reaction to anesthesia    Sarah [name]   . History of rectal fissure   . HIT (heparin-induced thrombocytopenia) ([place])   . Hypertension   . Obesity   . Paraplegia at T4 level ([place])   . Pulmonary embolus, right ([place]) 2015  . Rotator cuff tendonitis right   Past Surgical History:  Past Surgical History:  Procedure Laterality Date  . FEMUR IM NAIL Left 09/11/2017   Procedure: INTRAMEDULLARY (IM) RETROGRADE FEMORAL NAILING;  Surgeon: Altamese Ingram, MD;  Location: [place];  Service: Orthopedics;  Laterality: Left;  . LAMINECTOMY N/A 12/14/2013   Procedure: THORACIC LAMINECTOMY FOR TUMOR THORACIC THREE;  Surgeon: Ashok Pall, MD;  Location: [place] [facility]UR[facility];  Service: Neurosurgery;  Laterality: N/A;  THORACIC LAMINECTOMY FOR TUMOR THORACIC THREE  . LAMINECTOMY N/A 07/05/2015   Procedure: THORACIC LAMINECTOMY FOR TUMOR;  Surgeon: Ashok Pall, MD;  Location: [place] [facility]UR[facility];  Service: Neurosurgery;  Laterality: N/A;  . LAMINECTOMY N/A 09/03/2017   Procedure: POSTERIOR SPINAL TUMOR RESECTION THORACIC THREE;  Surgeon: Ashok Pall, MD;  Location: [place];   Service: Neurosurgery;  Laterality: N/A;  POSTERIOR SPINAL TUMOR RESECTION THORACIC THREE  . POSTERIOR LUMBAR FUSION 4 LEVEL N/A 12/30/2013   Procedure: Thoracic one-Thoracic five posterior thoracic fusion with pedicle screws;  Surgeon: Ashok Pall, MD;  Location: [place] [facility]UR[facility];  Service: Neurosurgery;  Laterality: N/A;  Thoracic one-Thoracic five posterior thoracic fusion with pedicle screws  . THYROIDECTOMY N/A 01/09/2014   Procedure: TOTAL THYROIDECTOMY;  Surgeon: Izora Gala, MD;  Location: [facility] OR;  Service: ENT;  Laterality: N/A;  . TONSILLECTOMY      Assessment & Plan Clinical Impression: [name] a 52 y.o. 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 Per chart review she lives with sister and brother-in-law. One level home with ramped entrance. Up until recently she was using a sliding board for transfers and ambulating short distances with a rolling walker up until May 2019. She had been using a wheelchair most the time for the past few years otherwise. She has had 2 previous back surgeries T3 in 2015 and 2017 he did receive inpatient rehab services. Presented 09/02/2017 with recent fall increasing leg weakness over the past 2 weeks. She was to see her oncologist 08/24/2017 for follow-up MRI that was planned however could not make the appointment due to lower extremity weakness. She did also have some urinary incontinence with noted neurogenic bladder. MRI and imaging revealed tumor recurrence at T3 with severe spinal cord compression. Underwent posterior spinal tumor resection T3 09/03/2017 per Dr. Christella Noa. Radiation oncology follow-up Dr. Dara Lords to follow-up outpatient for plan radiation therapy. Decadron protocol was indicated. Hospital course anemia and monitored. E. coli UTI completed a course of Rocephin. Physical and occupational therapy evaluations  completed and patient was admitted for a comprehensive rehab program 09/08/2017. Follow-up labs on admission to rehab services showed hemoglobin 6.2 that was confirmed and patient was transfused. Patient's initial plan was to resume Xarelto for history of pulmonary emboli but held due to anemia. CT of lumbar and thoracic spine as well as pelvis were unremarkable. Hemoglobin rebounded 8.7. Patient with ongoing complaints of left knee pain and some effusion. X-rays and imaging revealed left femoral shaft fracture likely felt to be subacute with noted history of recent fall. Orthopedic services consulted she was discharged to acute care services and underwent intramedullary retrograde femoral nailing 09/11/2017 per Dr. Marcelino Scot. Nonweightbearing left lower extremity. Her Xarelto was resumed postoperatively. Close monitoring of hemoglobin and hematocrit. Physical and Occupational Therapy evaluations completed postoperatively 6/22 and patient was admitted to inpatient rehab services 09/15/2017. Upon admission to inpatient rehab services patient with intractable [name] vomiting noted bouts of constipation. She was again discharged back to acute care services. Abdominal film showed nonobstructive bowel gas pattern. Moderate colorectal stool volume indicating constipation. Ultrasound the abdomen showed cholelithiasis without complicating factors. Bowel program was established. [name] vomiting greatly improved. Taken from Pt medical chart.    Patient transferred to CIR on 09/21/2017 .   Patient currently requires mod with mobility secondary to muscle paralysis, decreased cardiorespiratoy endurance, abnormal tone, unbalanced muscle activation, decreased coordination and decreased motor  planning and decreased sitting balance, decreased postural control and decreased balance strategies.  Prior to hospitalization, patient was modified independent  with mobility and lived with Family(sister and brother in [place]) in a  House home.  Home access is  Ramped entrance.  Patient will benefit from skilled PT intervention to maximize safe functional mobility, minimize fall risk and decrease caregiver burden for planned discharge home with intermittent assist.  Anticipate patient will benefit from follow up [facility] at discharge.  PT - End of Session Activity Tolerance: Tolerates 30+ min activity with multiple rests Endurance Deficit: Yes Endurance Deficit Description: fatigues quickly  PT Assessment Rehab Potential (ACUTE/IP ONLY): Good PT Barriers to Discharge: Decreased caregiver support;Weight bearing restrictions PT Plan PT Intensity: Minimum of 1-2 x/day ,45 to 90 minutes PT Frequency: 5 out of 7 days PT Duration Estimated Length of Stay: 18-21 PT Treatment/Interventions: Discharge planning;Functional mobility training;Psychosocial support;Therapeutic Activities;Balance/vestibular training;Neuromuscular re-education;Therapeutic Exercise;Wheelchair propulsion/positioning;DME/adaptive equipment instruction;Pain management;UE/LE Strength taining/ROM;Community reintegration;Functional electrical stimulation;Patient/family education;UE/LE Coordination activities PT Recommendation Follow Up Recommendations: Home health PT Patient destination: Home Equipment Recommended: Wheelchair cushion (measurements);Wheelchair (measurements)  Skilled Therapeutic Intervention Pt reported slight discomfort to middle thoracic region 2/10 on NPRS prior to evaluation onset. Eval focused on functional mobility, w/c propulsion, UE strengthening and dynamic/ static sitting balance EOM. Pt consistently performed all lateral scoot transfers with sliding board with mod A +1. Pt demo'd ability to verbalize correct sequencing of steps and assist with board placement. Pt required mod A for bed mobility assessment for LE and pelvis management to successfully perform tasks. Pt able to perform static and dynamic sitting balance EOB functionally reaching  for objects in all planes for ~3 min duration before requiring rest break between sets. Pt reported increased trunk control when reaching with her L UE>R UE. Pt will benefit from treatment interventions targeting postural control. PT initiated w/c propulsion for >500 feet working on turns, navigating distracting environment, and UE strength with functional task providing min A for steadying control. Pt required multiple rest breaks t/o session possibly 2/2 to poor endurance. PT initiated UE strengthening including 3 sets/15 reps of seated w/c push ups and rows with blue t-band with therapist providing graded manual resistance to Pt fatigue levels. Pt self propelled w/c back to her room at the end of session and was left upright in chair with call bell and tray able in reach and all needs met.   PT Evaluation Precautions/Restrictions Fall; NWB LLE   Home Living/Prior Functioning Home Living Available Help at Discharge: Available PRN/intermittently(sister and brother in law both work; Sarah lives close by and can offer help PRN) Type of Home: House Home Access: Ramped entrance Home Layout: One level Bathroom Shower/Tub: Research officer, trade union Accessibility: No Additional Comments: Pt reports using bedside commode  Lives With: Family(sister and brother in [place]) Prior Function Level of Independence: Requires assistive device for independence(Pt uses w/c for community and household distances)  Able to Take Stairs?: No Driving: No Comments: Pt reports using RW for short distances with therapy only until May 2019 Vision/Perception  Perception Perception: Within Functional Limits Praxis Praxis: Intact Praxis Impairment Details: Engineer, production Overall Cognitive Status: Within Functional Limits for tasks assessed Arousal/Alertness: Awake/alert Orientation Level: Oriented X4 Attention: Focused;Sustained Focused Attention: Appears intact Sustained Attention: Appears intact Memory:  Appears intact Awareness: Appears intact Awareness Impairment: Anticipatory impairment Problem Solving: Impaired Problem Solving Impairment: Verbal complex;Functional complex Executive Function: Self Correcting Self Correcting: Impaired Self Correcting Impairment: Functional basic Safety/Judgment: Appears intact Sensation Sensation Light  Touch: Impaired Detail Light Touch Impaired Details: Absent LLE;Absent RLE Coordination Gross Motor Movements are Fluid and Coordinated: No Heel Shin Test: unable to assess  Motor  Motor Motor: Paraplegia;Abnormal tone Motor - Skilled Clinical Observations: Pt demonstrates inability to actively move LE's  Mobility Bed Mobility Bed Mobility: Rolling Right;Rolling Left;Right Sidelying to Sit Rolling Right: Moderate Assistance - Patient 50-74% Rolling Left: Moderate Assistance - Patient 50-74% Right Sidelying to Sit: Moderate Assistance - Patient 50-74% Supine to Sit: Moderate Assistance - Patient 50-74% Sit to Supine: Moderate Assistance - Patient 50-74% Transfers Transfers: Lateral/Scoot Transfers Lateral/Scoot Transfers: Moderate Assistance - Patient 50-74% Transfer (Assistive device): (slide board) Locomotion  Gait Ambulation: No Gait Gait: No Stairs / Additional Locomotion Stairs: No Wheelchair Mobility Wheelchair Mobility: Yes Wheelchair Assistance: Minimal assistance - Patient >75% Wheelchair Propulsion: Both upper extremities Wheelchair Parts Management: Needs assistance  Trunk/Postural Assessment  Thoracic Assessment Thoracic Assessment: (Pt presents with rounding of shoulders and forward head) Lumbar Assessment Lumbar Assessment: (Pt sits in posterior pelvic tilt) Postural Control Postural Control: Deficits on evaluation Trunk Control: fair; maintains slight forward trunk flexion/ forward head for sitting balance  Balance Balance Balance Assessed: Yes Static Sitting Balance Static Sitting - Balance Support: No upper  extremity supported;Feet supported Static Sitting - Level of Assistance: 5: Stand by assistance Dynamic Sitting Balance Dynamic Sitting - Balance Support: Feet supported;No upper extremity supported Dynamic Sitting - Level of Assistance: 4: Min assist Dynamic Sitting - Balance Activities: Lateral lean/weight shifting;Forward lean/weight shifting;Reaching for objects;Reaching across midline Sitting balance - Comments: able to perform EOM dynamic reaching for cones in all planes with B UE's for ~3 min duration before requiring rest break and UE assistance Extremity Assessment  RUE Assessment RUE Assessment: Within Functional Limits LUE Assessment LUE Assessment: Within Functional Limits RLE Assessment RLE Assessment: Exceptions to [facility] General Strength Comments: Pt unable to demonstrate active movement in R LE.  LLE Assessment LLE Assessment: Exceptions to [facility] General Strength Comments: Pt unable to demonstrate active movement in L LE   See Function Navigator for Current Functional Status.   Refer to Care Plan for Long Term Goals  Recommendations for other services: Neuropsych  Discharge Criteria: Patient will be discharged from PT if patient refuses treatment 3 consecutive times without medical reason, if treatment goals not met, if there is a change in medical status, if patient makes no progress towards goals or if patient is discharged from hospital.  The above assessment, treatment plan, treatment alternatives and goals were discussed and mutually agreed upon: by patient  Floreen Comber 09/22/2017, 4:58 PM

## 2017-09-22 NOTE — H&P (Signed)
Physical Medicine and Rehabilitation Admission H&P     HPI: Sarah [name]. 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  Per chart review she lives with sister and brother-in-law.  One level home with ramped entrance.  Up until recently she was using a sliding board for transfers and ambulating short distances with a rolling walker up until May 2019.  She had been using a wheelchair most the time for the past few years otherwise.  She has had 2 previous back surgeries T3 in 2015 and 2017 he did receive inpatient rehab services.  Presented 09/02/2017 with recent fall increasing leg weakness over the past 2 weeks.  She was to see her oncologist 08/24/2017 for follow-up MRI that was planned however could not make the appointment due to lower extremity weakness.  She did also have some urinary incontinence with noted neurogenic bladder.  MRI and imaging revealed tumor recurrence at T3 with severe spinal cord compression.  Underwent posterior spinal tumor resection T3 09/03/2017 per Dr. Christella Noa.  Radiation oncology follow-up Dr. Dara Lords to follow-up outpatient for plan radiation therapy.  Decadron protocol was indicated.  Hospital course anemia and monitored.  E. coli UTI completed a course of Rocephin.  Physical and occupational therapy evaluations completed and patient was admitted for a comprehensive rehab program 09/08/2017.  Follow-up labs on admission to rehab services showed hemoglobin 6.2 that was confirmed and patient was transfused.  Patient's initial plan was to resume Xarelto for history of pulmonary emboli but held due to anemia.  CT of lumbar and thoracic spine as well as pelvis were unremarkable.  Hemoglobin rebounded 8.7.  Patient with ongoing complaints of left knee pain and some effusion.  X-rays and imaging revealed left femoral shaft fracture likely felt to be  subacute with noted history of recent fall.  Orthopedic services consulted she was discharged to acute care services and underwent intramedullary retrograde femoral nailing 09/11/2017 per Dr. Marcelino Scot.  Nonweightbearing left lower extremity.  Her Xarelto was resumed postoperatively.  Close monitoring of hemoglobin and hematocrit.  Physical and Occupational Therapy evaluations completed postoperatively 6/22 and patient was admitted to inpatient rehab services 09/15/2017.  Upon admission to inpatient rehab services patient with intractable nausea vomiting noted bouts of constipation.  She was again discharged back to acute care services.  Abdominal film showed nonobstructive bowel gas pattern.  Moderate colorectal stool volume indicating constipation.  Ultrasound the abdomen showed cholelithiasis without complicating factors.  Bowel program was established.  Nausea vomiting greatly improved.  Patient again is readmitted back to inpatient rehab services for comprehensive rehab program.  Review of Systems  Constitutional: Negative for chills and fever.  HENT: Negative for hearing loss.   Eyes: Negative for blurred vision and double vision.  Respiratory: Negative for cough and shortness of breath.   Cardiovascular: Positive for leg swelling. Negative for chest pain.  Gastrointestinal: Positive for constipation, nausea and vomiting.  Genitourinary: Negative for flank pain and hematuria.       Urinary incontinence  Musculoskeletal: Positive for back pain, joint pain and myalgias.  Skin: Negative for rash.  Neurological: Positive for sensory change and focal weakness.  All other systems reviewed and are negative.  Past Medical History:  Diagnosis Date  . Cancer [facility])    FNA of thyroid positive for onconytic/hurthle cell carcinoma  . Chronic back pain   . DDD (degenerative disc disease), cervical   .  DJD (degenerative joint disease)   . Family history of adverse reaction to anesthesia    MOTHER HAS NAUSEA    . History of rectal fissure   . HIT (heparin-induced thrombocytopenia) ([place])   . Hypertension   . Obesity   . Paraplegia at T4 level ([place])   . Pulmonary embolus, right ([place]) 2015  . Rotator cuff tendonitis right   Past Surgical History:  Procedure Laterality Date  . FEMUR IM NAIL Left 09/11/2017   Procedure: INTRAMEDULLARY (IM) RETROGRADE FEMORAL NAILING;  Surgeon: Altamese Cantril, MD;  Location: [facility];  Service: Orthopedics;  Laterality: Left;  . LAMINECTOMY N/A 12/14/2013   Procedure: THORACIC LAMINECTOMY FOR TUMOR THORACIC THREE;  Surgeon: Ashok Pall, MD;  Location: [facility];  Service: Neurosurgery;  Laterality: N/A;  THORACIC LAMINECTOMY FOR TUMOR THORACIC THREE  . LAMINECTOMY N/A 07/05/2015   Procedure: THORACIC LAMINECTOMY FOR TUMOR;  Surgeon: Ashok Pall, MD;  Location: [facility];  Service: Neurosurgery;  Laterality: N/A;  . LAMINECTOMY N/A 09/03/2017   Procedure: POSTERIOR SPINAL TUMOR RESECTION THORACIC THREE;  Surgeon: Ashok Pall, MD;  Location: [facility];  Service: Neurosurgery;  Laterality: N/A;  POSTERIOR SPINAL TUMOR RESECTION THORACIC THREE  . POSTERIOR LUMBAR FUSION 4 LEVEL N/A 12/30/2013   Procedure: Thoracic one-Thoracic five posterior thoracic fusion with pedicle screws;  Surgeon: Ashok Pall, MD;  Location: [facility];  Service: Neurosurgery;  Laterality: N/A;  Thoracic one-Thoracic five posterior thoracic fusion with pedicle screws  . THYROIDECTOMY N/A 01/09/2014   Procedure: TOTAL THYROIDECTOMY;  Surgeon: Izora Gala, MD;  Location: [facility];  Service: ENT;  Laterality: N/A;  . TONSILLECTOMY     Family History  Problem Relation Age of Onset  . Hypertension Mother   . Diabetes Mother   . Hypertension Father   . Stroke Father   . Diabetes Father   . Diabetes Sister   . Diabetes Sister    Social History:  reports that she has never smoked. She has never used smokeless tobacco. She reports that she does not drink alcohol or use drugs. Allergies:  Allergies    Allergen Reactions  . Bee Venom Anaphylaxis  . Keflex [Cephalexin] Nausea And Vomiting  . Tramadol Nausea And Vomiting  . Gabapentin Nausea And Vomiting  . Heparin Other (See Comments)    Weak positive platelets induced antibodies, SRA negative--2015  . Hydrochlorothiazide Nausea And Vomiting  . Hydrocodone Nausea And Vomiting  . Oxycodone-Acetaminophen Nausea And Vomiting   Medications Prior to Admission  Medication Sig Dispense Refill  . acetaminophen (TYLENOL) 325 MG tablet Take 2 tablets (650 mg total) by mouth every 4 (four) hours as needed for mild pain ((score 1 to 3) or temp > 100.5).    . baclofen (LIORESAL) 20 MG tablet TAKE (1) TABLET THREE TIMES DAILY. 90 tablet 1  . diazepam (VALIUM) 5 MG tablet Take 1 tablet (5 mg total) by mouth every 6 (six) hours as needed for muscle spasms. 30 tablet 0  . fluticasone (FLONASE) 50 MCG/ACT nasal spray Place 2 sprays into both nostrils daily. 16 g 6  . furosemide (LASIX) 20 MG tablet Take 1 tablet (20 mg total) by mouth daily as needed. (Patient taking differently: Take 20 mg by mouth daily as needed for fluid. ) 30 tablet 1  . HYDROcodone-acetaminophen (NORCO/VICODIN) 5-325 MG tablet Take 1-2 tablets by mouth every 4 (four) hours as needed for moderate pain. 30 tablet 0  . levothyroxine (SYNTHROID, LEVOTHROID) 137 MCG tablet Take 137 mcg by mouth daily  before breakfast.    . lisinopril (PRINIVIL,ZESTRIL) 20 MG tablet Take 2 tablets (40 mg total) by mouth daily. 60 tablet 1  . ondansetron (ZOFRAN) 4 MG tablet Take 1 tablet (4 mg total) by mouth every 6 (six) hours. 30 tablet 5  . pantoprazole (PROTONIX) 40 MG tablet Take 1 tablet (40 mg total) by mouth daily. 30 tablet 2  . rivaroxaban (XARELTO) 20 MG TABS tablet Take 20 mg by mouth daily with supper.    . sertraline (ZOLOFT) 25 MG tablet Take 1 tablet (25 mg total) by mouth daily. 30 tablet 3  . traZODone (DESYREL) 50 MG tablet Take 1 tablet (50 mg total) by mouth at bedtime as needed for  sleep.    . bisacodyl (DULCOLAX) 10 MG suppository Place 1 suppository (10 mg total) rectally at bedtime. (Patient not taking: Reported on 09/17/2017) 12 suppository 0  . dexamethasone (DECADRON) 4 MG tablet Take 1 tablet (4 mg total) by mouth every 6 (six) hours. (Patient not taking: Reported on 09/17/2017)    . senna (SENOKOT) 8.6 MG TABS tablet Take 2 tablets (17.2 mg total) by mouth daily. (Patient not taking: Reported on 09/17/2017) 120 each 0  . sulfamethoxazole-trimethoprim (BACTRIM DS,SEPTRA DS) 800-160 MG tablet Take 1 tablet by mouth every 12 (twelve) hours.      Drug Regimen Review Drug regimen was reviewed and remains appropriate with no significant issues identified  Home: Home Living Family/patient expects to be discharged to:: Inpatient rehab Living Arrangements: Other relatives Available Help at Discharge: Available PRN/intermittently Type of Home: House Home Access: Ramped entrance Home Layout: One level Bathroom Shower/Tub: Chiropodist: Handicapped height Bathroom Accessibility: No Home Equipment: Environmental consultant - 2 wheels, Bedside commode, Shower seat, Wheelchair - power, Wheelchair - manual, Hospital bed, Other (comment) Additional Comments: Pt reports using bedside commode  Lives With: Family   Functional History: Prior Function Level of Independence: Needs assistance Gait / Transfers Assistance Needed: Prior to tumor resection, was performing slide board transfers and short distance ambulation. At rehab, was utilizing [place] for sit<>stand.  ADL's / Homemaking Assistance Needed: Assistance for LB ADL. Able to complete UB ADL without assistance at bed level.  Comments: Pt uses manual chair most often for past 3 years. Pt was using RW for short distances with therapy only  until May 2019. Pt reports staying home most days with her nephew  Functional Status:  Mobility: Bed Mobility Overal bed mobility: Needs Assistance Bed Mobility: Supine to  Sit Supine to sit: Max assist, +2 for physical assistance, HOB elevated General bed mobility comments: Used bed pad to assist with mobilizing pt to EOB Transfers Overall transfer level: Needs assistance Equipment used: Sliding board Transfers: Lateral/Scoot Transfers  Lateral/Scoot Transfers: Max assist, +2 physical assistance General transfer comment: patient knowledgable regarding transfer and need for correct positioning. Verbal cueing for hand placement and weight shift      ADL: ADL Overall ADL's : Needs assistance/impaired Grooming: Set up, Sitting Upper Body Bathing: Set up, Sitting Lower Body Bathing: Maximal assistance, Bed level Upper Body Dressing : Minimal assistance, Bed level Lower Body Dressing: Total assistance, Bed level Toilet Transfer: Maximal assistance, +2 for physical assistance(simulated) Toileting- Clothing Manipulation and Hygiene: Total assistance Toileting - Clothing Manipulation Details (indicate cue type and reason): incontinenet Functional mobility during ADLs: Maximal assistance, +2 for physical assistance  Cognition: Cognition Overall Cognitive Status: Within Functional Limits for tasks assessed Orientation Level: Oriented X4 Cognition Arousal/Alertness: Awake/alert Behavior During Therapy: WFL for tasks assessed/performed Overall  Cognitive Status: Within Functional Limits for tasks assessed  Physical Exam: Blood pressure 128/90, pulse 86, temperature 97.9 F (36.6 C), resp. rate 18, height 5' 11"  (1.803 m), weight 107.9 kg (237 lb 14 oz), last menstrual period 10/22/2016, SpO2 100 %. Physical Exam  Vitals reviewed. Constitutional: She is oriented to person, place, and time. She appears well-developed and well-nourished.  HENT:  Head: Normocephalic and atraumatic.  Eyes: EOM are normal. Right eye exhibits no discharge. Left eye exhibits no discharge.  Neck: Normal range of motion. Neck supple. No thyromegaly present.  Cardiovascular: Normal  rate, regular rhythm and normal heart sounds.  Respiratory: Effort normal and breath sounds normal. No respiratory distress.  GI: Soft. Bowel sounds are normal. She exhibits no distension.  Musculoskeletal:  No edema or tenderness in extremities  Neurological: She is alert and oriented to person, place, and time.  Motor: Bilateral upper extremities: 5/5 proximal to distal Bilateral lower extremity: 0/5 proximal to distal Sensation diminished to light touch bilateral lower extremities Dysphonia  Skin: Skin is warm and dry.  Psychiatric: She has a normal mood and affect. Her behavior is normal.    Results for orders placed or performed during the hospital encounter of 09/16/17 (from the past 48 hour(s))  Glucose, capillary     Status: Abnormal   Collection Time: 09/19/17 11:51 AM  Result Value Ref Range   Glucose-Capillary 100 (H) 70 - 99 mg/dL  Glucose, capillary     Status: Abnormal   Collection Time: 09/19/17  6:22 PM  Result Value Ref Range   Glucose-Capillary 111 (H) 70 - 99 mg/dL  Glucose, capillary     Status: Abnormal   Collection Time: 09/19/17  9:10 PM  Result Value Ref Range   Glucose-Capillary 140 (H) 70 - 99 mg/dL  Glucose, capillary     Status: Abnormal   Collection Time: 09/19/17 11:26 PM  Result Value Ref Range   Glucose-Capillary 105 (H) 70 - 99 mg/dL  Basic metabolic panel     Status: Abnormal   Collection Time: 09/20/17  4:21 AM  Result Value Ref Range   Sodium 135 135 - 145 mmol/L   Potassium 4.3 3.5 - 5.1 mmol/L    Comment: DELTA CHECK NOTED   Chloride 103 98 - 111 mmol/L    Comment: Please note change in reference range.   CO2 27 22 - 32 mmol/L   Glucose, Bld 109 (H) 70 - 99 mg/dL    Comment: Please note change in reference range.   BUN 20 6 - 20 mg/dL    Comment: Please note change in reference range.   Creatinine, Ser 0.83 0.44 - 1.00 mg/dL   Calcium 7.5 (L) 8.9 - 10.3 mg/dL   GFR calc non Af Amer >60 >60 mL/min   GFR calc Af Amer >60 >60 mL/min     Comment: (NOTE) The eGFR has been calculated using the CKD EPI equation. This calculation has not been validated in all clinical situations. eGFR's persistently <60 mL/min signify possible Chronic Kidney Disease.    Anion gap 5 5 - 15    Comment: Performed at [facility] [address] [place], [place] [address]  CBC     Status: Abnormal   Collection Time: 09/20/17  4:21 AM  Result Value Ref Range   WBC 5.7 4.0 - 10.5 K/uL   RBC 2.76 (L) 3.87 - 5.11 MIL/uL   Hemoglobin 8.4 (L) 12.0 - 15.0 g/dL   HCT 26.6 (L) 36.0 - 46.0 %  MCV 96.4 78.0 - 100.0 fL   MCH 30.4 26.0 - 34.0 pg   MCHC 31.6 30.0 - 36.0 g/dL   RDW 19.3 (H) 11.5 - 15.5 %   Platelets 158 150 - 400 K/uL    Comment: Performed at [facility] [address], [place], [place] [address]  Glucose, capillary     Status: None   Collection Time: 09/20/17  7:00 AM  Result Value Ref Range   Glucose-Capillary 96 70 - 99 mg/dL  Glucose, capillary     Status: None   Collection Time: 09/20/17  8:17 AM  Result Value Ref Range   Glucose-Capillary 92 70 - 99 mg/dL  Glucose, capillary     Status: None   Collection Time: 09/20/17 12:48 PM  Result Value Ref Range   Glucose-Capillary 97 70 - 99 mg/dL  Glucose, capillary     Status: Abnormal   Collection Time: 09/20/17  4:54 PM  Result Value Ref Range   Glucose-Capillary 109 (H) 70 - 99 mg/dL  Glucose, capillary     Status: Abnormal   Collection Time: 09/20/17  9:54 PM  Result Value Ref Range   Glucose-Capillary 103 (H) 70 - 99 mg/dL  Glucose, capillary     Status: Abnormal   Collection Time: 09/21/17 12:05 AM  Result Value Ref Range   Glucose-Capillary 112 (H) 70 - 99 mg/dL  Glucose, capillary     Status: Abnormal   Collection Time: 09/21/17  4:08 AM  Result Value Ref Range   Glucose-Capillary 114 (H) 70 - 99 mg/dL  Glucose, capillary     Status: Abnormal   Collection Time: 09/21/17  7:42 AM  Result Value Ref Range   Glucose-Capillary 101 (H) 70 - 99 mg/dL    No results found.     Medical Problem List and Plan: 1.  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 2.  DVT Prophylaxis/Anticoagulation/history of pulmonary emboli: Xarelto.  Monitor for any bleeding episodes 3. Pain Management: Baclofen 20 mg nightly, 4. Mood: Zoloft 25 mg daily.  Provide emotional support 5. Neuropsych: This patient is capable of making decisions on her own behalf. 6. Skin/Wound Care: Routine skin checks 7. Fluids/Electrolytes/Nutrition: Routine in and outs with follow-up chemistries 8.  Left femoral shaft fracture.  Status post intramedullary retrograde nailing 09/11/2017.  Nonweightbearing. 9.  Acute blood loss anemia.  Follow-up CBC 10.  Hyperglycemia secondary to steroid.  SSI. 11.  Neurogenic bowel/ bladder.  Established bowel program 12.  Hypothyroidism.  Synthroid   Post Admission Physician Evaluation: 1. Preadmission assessment reviewed and changes made below. 2. Functional deficits secondary  to metastatic Hurthle cancer to the thoracic spine s/p recurrence with resection. 3. [name] admitted to receive collaborative, interdisciplinary care between the physiatrist, rehab nursing staff, and therapy team. 4. Patient's level of medical complexity and substantial therapy needs in context of that medical necessity cannot be provided at a lesser intensity of care such as a SNF. 5. Patient has experienced substantial functional loss from his/her baseline which was documented above under the "Functional History" and "Functional Status" headings.  Judging by the patient's diagnosis, physical exam, and functional history, the patient has potential for functional progress which will result in measurable gains while on inpatient rehab.  These gains will be of substantial and practical use upon discharge  in facilitating mobility and self-care at the household  level. 6. Physiatrist will provide 24 hour management  of medical needs as well as oversight of the therapy plan/treatment and provide guidance as appropriate regarding the interaction of the two. 7. 24 hour rehab nursing will assist with bladder management, bowel management, safety, skin/wound care, disease management, pain management and patient education  and help integrate therapy concepts, techniques,education, etc. 8. PT will assess and treat for/with: Lower extremity strength, range of motion, stamina, balance, functional mobility, safety, adaptive techniques and equipment, wound care, coping skills, pain control, SCI education. Goals are: Mod I/Supervision at wheelchair level. 9. OT will assess and treat for/with: ADL's, functional mobility, safety, upper extremity strength, adaptive techniques and equipment, wound mgt, ego support, and community reintegration.   Goals are: Mod I/Supervision at wheelchair level. Therapy may proceed with showering this patient. 10. Case Management and Social Worker will assess and treat for psychological issues and discharge planning. 11. Team conference will be held weekly to assess progress toward goals and to determine barriers to discharge. 12. Patient will receive at least 3 hours of therapy per day at least 5 days per week. 13. ELOS: 15-19 days.       14. Prognosis:  good  I have personally performed a face to face diagnostic evaluation, including, but not limited to relevant history and physical exam findings, of this patient and developed relevant assessment and plan.  Additionally, I have reviewed and concur with the physician assistant's documentation above.  Delice Lesch, MD, ABPMR Sarah Paganini Angiulli, PA-C 09/21/2017

## 2017-09-22 NOTE — Evaluation (Signed)
Occupational Therapy Assessment and Plan  Patient Details  Name: Sarah [name] MRN: [id number] Date of Birth: 1965/05/13  OT Diagnosis: paraplegia at level T3 Rehab Potential: Rehab Potential (ACUTE ONLY): Good ELOS: 2-2 1/2 weeks   Today's Date: 09/22/2017 OT Individual Time: 0900-1000 OT Individual Time Calculation (min): 60 min     Problem List:  Patient Active Problem List   Diagnosis Date Noted  . Myelopathy ([place]) 09/21/2017  . Muscle spasms of both lower extremities   . Closed fracture of shaft of left femur ([place])   . Acute blood loss anemia   . Hypothyroidism   . Nausea & vomiting 09/15/2017  . Closed fracture of left distal femur ([place]) 09/11/2017  . Paraplegia ([place]) 09/08/2017  . Obesity (BMI 30-39.9) 09/03/2017  . Status post surgery 09/03/2017  . Calculus of gallbladder without cholecystitis without obstruction   . Biliary dyskinesia   . Intractable nausea and vomiting 08/09/2017  . Thrombocytopenia ([place]) 08/09/2017  . Elevated bilirubin 08/09/2017  . AKI (acute kidney injury) ([place]) 08/09/2017  . Nasal congestion 04/05/2017  . Breast pain 01/26/2017  . Upper respiratory infection, viral 01/26/2017  . Counseling regarding advanced care planning and goals of care 01/02/2017  . Bilateral rotator cuff syndrome 02/12/2016  . E. coli UTI 08/01/2015  . Constipation due to neurogenic bowel 08/01/2015  . Muscle spasticity   . 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 Spastic paraparesis   . Neurogenic bladder   . History of pulmonary embolism   . Post-operative pain   . Metastatic cancer ([place])   . Steroid-induced hyperglycemia   . Depression   . Obstipation   . Thoracic spine tumor 07/06/2015  . 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 ([place]) 07/06/2015  . Spinal cord compression due to malignant neoplasm metastatic to spine ([place])   . Postoperative hypothyroidism   . 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 Spasticity 09/19/2014  . Dysuria  09/04/2014  . Hypertension 05/01/2014  . Ingrown right big toenail 05/01/2014  . GERD (gastroesophageal reflux disease) 02/14/2014  . Dysphagia, pharyngoesophageal phase 02/06/2014  . Type 2 diabetes mellitus without complication ([place]) 19/75/8832  . Constipation   . Neurogenic bowel   . Paraplegia at T4 level ([place]) 01/20/2014  . Metastatic cancer to spine ([place]) 01/19/2014  . Rotator cuff tendonitis   . 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 Leg weakness, bilateral 12/13/2013    Past Medical History:  Past Medical History:  Diagnosis Date  . Cancer [facility])    FNA of thyroid positive for onconytic/hurthle cell carcinoma  . Chronic back pain   . DDD (degenerative disc disease), cervical   . DJD (degenerative joint disease)   . Family history of adverse reaction to anesthesia    MOTHER HAS NAUSEA   . History of rectal fissure   . HIT (heparin-induced thrombocytopenia) ([place])   . Hypertension   . Obesity   . Paraplegia at T4 level ([place])   . Pulmonary embolus, right ([place]) 2015  . Rotator cuff tendonitis right   Past Surgical History:  Past Surgical History:  Procedure Laterality Date  . FEMUR IM NAIL Left 09/11/2017   Procedure: INTRAMEDULLARY (IM) RETROGRADE FEMORAL NAILING;  Surgeon: Altamese Elm Creek, MD;  Location: [place];  Service: Orthopedics;  Laterality: Left;  . LAMINECTOMY N/A 12/14/2013   Procedure: THORACIC LAMINECTOMY FOR TUMOR THORACIC THREE;  Surgeon: Ashok Pall, MD;  Location: [place] NEURO ORS;  Service: Neurosurgery;  Laterality: N/A;  THORACIC LAMINECTOMY FOR TUMOR THORACIC THREE  . LAMINECTOMY N/A 07/05/2015   Procedure: THORACIC LAMINECTOMY FOR TUMOR;  Surgeon: Ashok Pall, MD;  Location: [place] NEURO ORS;  Service: Neurosurgery;  Laterality: N/A;  . LAMINECTOMY N/A 09/03/2017   Procedure: POSTERIOR SPINAL TUMOR RESECTION THORACIC THREE;  Surgeon: Ashok Pall, MD;  Location: [place];  Service: Neurosurgery;   Laterality: N/A;  POSTERIOR SPINAL TUMOR RESECTION THORACIC THREE  . POSTERIOR LUMBAR FUSION 4 LEVEL N/A 12/30/2013   Procedure: Thoracic one-Thoracic five posterior thoracic fusion with pedicle screws;  Surgeon: Ashok Pall, MD;  Location: [place] NEURO ORS;  Service: Neurosurgery;  Laterality: N/A;  Thoracic one-Thoracic five posterior thoracic fusion with pedicle screws  . THYROIDECTOMY N/A 01/09/2014   Procedure: TOTAL THYROIDECTOMY;  Surgeon: Izora Gala, MD;  Location: [facility];  Service: ENT;  Laterality: N/A;  . TONSILLECTOMY      Assessment & Plan Clinical Impression: [name] a 52 y.o. 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  Per chart review she lives with sister and brother-in-law.  One level home with ramped entrance.  Up until recently she was using a sliding board for transfers and ambulating short distances with a rolling walker up until May 2019.  She had been using a wheelchair most the time for the past few years otherwise.  She has had 2 previous back surgeries T3 in 2015 and 2017 he did receive inpatient rehab services.  Presented 09/02/2017 with recent fall increasing leg weakness over the past 2 weeks.  She was to see her oncologist 08/24/2017 for follow-up MRI that was planned however could not make the appointment due to lower extremity weakness.  She did also have some urinary incontinence with noted neurogenic bladder.  MRI and imaging revealed tumor recurrence at T3 with severe spinal cord compression.  Underwent posterior spinal tumor resection T3 09/03/2017 per Dr. Christella Noa.  Radiation oncology follow-up Dr. Dara Lords to follow-up outpatient for plan radiation therapy.  Decadron protocol was indicated.  Hospital course anemia and monitored.  E. coli UTI completed a course of Rocephin.  Physical and occupational therapy evaluations completed and patient was  admitted for a comprehensive rehab program 09/08/2017.  Follow-up labs on admission to rehab services showed hemoglobin 6.2 that was confirmed and patient was transfused.  Patient's initial plan was to resume Xarelto for history of pulmonary emboli but held due to anemia.  CT of lumbar and thoracic spine as well as pelvis were unremarkable.  Hemoglobin rebounded 8.7.  Patient with ongoing complaints of left knee pain and some effusion.  X-rays and imaging revealed left femoral shaft fracture likely felt to be subacute with noted history of recent fall.  Orthopedic services consulted she was discharged to acute care services and underwent intramedullary retrograde femoral nailing 09/11/2017 per Dr. Marcelino Scot.  Nonweightbearing left lower extremity.  Her Xarelto was resumed postoperatively.  Close monitoring of hemoglobin and hematocrit.  Physical and Occupational Therapy evaluations completed postoperatively 6/22 and patient was admitted to inpatient rehab services 09/15/2017.  Upon admission to inpatient rehab services patient with intractable nausea vomiting noted bouts of constipation.  She was again discharged back to acute care services.  Abdominal film showed nonobstructive bowel gas pattern.  Moderate colorectal stool volume indicating constipation.  Ultrasound the abdomen showed cholelithiasis without complicating factors.  Bowel program was established.  Nausea vomiting greatly improved.  Patient transferred to CIR on 09/21/2017 .    Patient currently requires mod to max A  with basic self-care skills and basic mobility secondary to muscle weakness and edema, skin intergrity, decreased cardiorespiratoy endurance and decreased sitting balance, decreased postural control and decreased balance strategies.  Prior to hospitalization, patient could complete ADL with modified independent .  Patient will benefit from skilled intervention to decrease level of assist with basic self-care skills and increase  independence with basic self-care skills prior to discharge home with care partner.  Anticipate patient will require intermittent supervision and follow up home health.  OT - End of Session Activity Tolerance: Tolerates 30+ min activity without fatigue Endurance Deficit: Yes OT Assessment Rehab Potential (ACUTE ONLY): Good OT Barriers to Discharge: Pending chemo/radiation OT Patient demonstrates impairments in the following area(s): Balance;Edema;Safety;Endurance;Motor;Skin Integrity;Sensory OT Basic ADL's Functional Problem(s): Toileting;Dressing;Bathing;Grooming;Eating OT Transfers Functional Problem(s): Toilet OT Additional Impairment(s): None OT Plan OT Intensity: Minimum of 1-2 x/day, 45 to 90 minutes OT Frequency: 5 out of 7 days OT Duration/Estimated Length of Stay: 2-2 1/2 weeks OT Treatment/Interventions: Balance/vestibular training;Community reintegration;Disease mangement/prevention;Neuromuscular re-education;Patient/family education;Self Care/advanced ADL retraining;Splinting/orthotics;Therapeutic Exercise;UE/LE Coordination activities;Wheelchair propulsion/positioning;Discharge planning;DME/adaptive equipment instruction;Pain management;Functional mobility training;Psychosocial support;Skin care/wound managment;Therapeutic Activities;UE/LE Strength taining/ROM OT Self Feeding Anticipated Outcome(s): n/a OT Basic Self-Care Anticipated Outcome(s): setup  OT Toileting Anticipated Outcome(s): setup  OT Bathroom Transfers Anticipated Outcome(s): contact guard OT Recommendation Recommendations for Other Services: Therapeutic Recreation consult;Neuropsych consult Therapeutic Recreation Interventions: Stress management;Outing/community reintergration Patient destination: Home Follow Up Recommendations: Home health OT Equipment Recommended: To be determined   Skilled Therapeutic Intervention OT eval initiated with OT goals, purpose and role discussed. Pt is known to this therapist  from previous admit before sx. Discussion and education provided on bladder management with nursing.  Pt had not voided only had a little "accident" so requested pt ot be scanned and then needed to be cathed.  Assisted with positioning of LEs in prep for cathing procedure. Pt unable to assist with positioning of her LEs. Pt required total A for LB bathing and donning clothing; however was able to roll to the right with bed rail with setup and min A for rolling towards her left with bed rail. Pt still presents with significant extensor tone in her LEs. Pt dressed UB in bed with HOB elevated. Pt performed sitting in upright bed to EOB with max A for management of trunk and LEs. Pt performed mod A +2 for safety slide board transfer into tilt in space w/c (to assist with pressure relief). Pt able to perform all grooming tasks at sink with setup.  Pt washed her hair at the sink with setup.  LEft up at sink.   OT Evaluation Precautions/Restrictions  Precautions Precautions: Fall;Back Precaution Comments: throracic region, LEs spasicity  Restrictions Weight Bearing Restrictions: Yes LLE Weight Bearing: Non weight bearing General Chart Reviewed: Yes Family/Caregiver Present: No   Pain Pain Assessment Pain Scale: 0-10 Pain Score: 0-No pain Home Living/Prior Functioning Home Living Living Arrangements: Other relatives Available Help at Discharge: Available PRN/intermittently Type of Home: House Home Access: Ramped entrance Home Layout: One level Bathroom Shower/Tub: Tub/shower unit(unable to access) Additional Comments: Pt reports using bedside commode  Lives With: Family ADL ADL ADL Comments: see functional navigator Vision Baseline Vision/History: Wears glasses Wears Glasses: Reading only Patient Visual Report: No change from baseline Vision Assessment?: No apparent visual deficits Perception  Perception: Within Functional Limits Praxis Praxis Impairment Details: Motor  planning Cognition Overall Cognitive Status: Within Functional Limits for  tasks assessed Arousal/Alertness: Awake/alert Orientation Level: Person;Place;Situation Person: Oriented Place: Oriented Situation: Oriented Year: 2019 Month: June Day of Week: Correct Memory: Appears intact Immediate Memory Recall: Sock;Blue;Bed Memory Recall: Sock;Blue;Bed Memory Recall Sock: Without Cue Memory Recall Blue: Without Cue Memory Recall Bed: Without Cue Attention: Focused;Sustained Focused Attention: Appears intact Sustained Attention: Appears intact Awareness: Appears intact Awareness Impairment: Anticipatory impairment Problem Solving: Appears intact Problem Solving Impairment: Verbal complex;Functional complex Safety/Judgment: Appears intact Sensation Sensation Light Touch: Impaired Detail Central sensation comments: LE 0/5 HF, KE, ADF/PF. ?1/2 sensation below waist. No resting tone; but present with movement right>left Light Touch Impaired Details: Absent LLE;Absent RLE Proprioception: Impaired Detail Proprioception Impaired Details: Absent RLE;Absent LLE Coordination Gross Motor Movements are Fluid and Coordinated: No Fine Motor Movements are Fluid and Coordinated: Yes Coordination and Movement Description: decr active movement in LEs; tone present Motor  Motor Motor: Paraplegia;Abnormal tone Mobility  Bed Mobility Supine to Sit: Maximal Assistance - Patient - Patient 25-49% Sitting - Scoot to Edge of Bed: Maximal Assistance - Patient 25-49%  Trunk/Postural Assessment  Cervical Assessment Cervical Assessment: Within Functional Limits Thoracic Assessment Thoracic Assessment: (forward flexed; rounded shoulders) Lumbar Assessment Lumbar Assessment: (posterior pelvic tilt) Postural Control Postural Control: Deficits on evaluation Trunk Control: fair ; requiring bilateral UE support and needs to be forward flexed to maintain balance Righting Reactions: present with  UEs Protective Responses: Present with UEs  Balance Static Sitting Balance Static Sitting - Level of Assistance: 4: Min assist Dynamic Sitting Balance Dynamic Sitting - Balance Support: Feet supported;No upper extremity supported Dynamic Sitting - Level of Assistance: 4: Min assist Extremity/Trunk Assessment RUE Assessment RUE Assessment: Within Functional Limits LUE Assessment LUE Assessment: Within Functional Limits   See Function Navigator for Current Functional Status.   Refer to Care Plan for Long Term Goals  Recommendations for other services: Neuropsych   Discharge Criteria: Patient will be discharged from OT if patient refuses treatment 3 consecutive times without medical reason, if treatment goals not met, if there is a change in medical status, if patient makes no progress towards goals or if patient is discharged from hospital.  The above assessment, treatment plan, treatment alternatives and goals were discussed and mutually agreed upon: by patient  Nicoletta Ba 09/22/2017, 10:45 AM

## 2017-09-22 NOTE — Progress Notes (Signed)
Occupational Therapy Session Note  Patient Details  Name: Sarah [name] MRN: [id number] Date of Birth: [birth year]  Today's Date: 09/22/2017 OT Individual Time: 4268-3419 OT Individual Time Calculation (min): 55 min    Short Term Goals: Week 1:  OT Short Term Goal 1 (Week 1): Pt will transfer to padded tub bench for toileting with mod A  OT Short Term Goal 2 (Week 1): Pt will maintain static sitting balance with one UE support with supervision  OT Short Term Goal 3 (Week 1): Pt will maintain dynamic sitting balance in prep for ADL with min A   Skilled Therapeutic Interventions/Progress Updates:    Upon entering the room, pt seated in wheelchair awaiting therapist. Pt propelled wheelchair to gym with supervision and increased time with B UEs for strengthening. Pt transferred onto mat in gym with slide board and step placed under R LE secondary to feet not touching ground. Pt required max A to the R onto mat. Pt practicing lateral leans, for LB clothing management and toilet needs, L <> R onto elbow with min A for balance. Pt able to push onto hand and back up to sitting position with increased time and effort in both directions. Pt returning to wheelchair with second person to manage equipment for safety. Pt propelling back to room and NT arrived for bladder scan. OT discussed problem solving for LB dressing from supine position in bed at next session. Pt remained seated in wheelchair with call bell and all needed items within reach.   Therapy Documentation Precautions:  Precautions Precautions: Fall Precaution Comments: throracic region, LEs spasicity  Restrictions Weight Bearing Restrictions: Yes LLE Weight Bearing: Non weight bearing Other Position/Activity Restrictions: LLE NWB General:   Vital Signs: Therapy Vitals Temp: 98.7 F (37.1 C) Temp Source: Oral Pulse Rate: 88 Resp: 18 BP: 114/75 Patient Position (if appropriate): Sitting Oxygen Therapy SpO2: 100 % O2 Device: Room  Air Pain: Pain Assessment Pain Scale: 0-10 Pain Score: 2 (Pt reports mild discomfort to middle back) Pain Type: Surgical pain Pain Location: Thoracic Pain Orientation: Mid Pain Descriptors / Indicators: Aching;Discomfort Pain Onset: On-going Patients Stated Pain Goal: 0 Multiple Pain Sites: No ADL: ADL ADL Comments: see functional navigator Vision Baseline Vision/History: Wears glasses Wears Glasses: Reading only Patient Visual Report: No change from baseline Vision Assessment?: No apparent visual deficits Perception  Perception: Within Functional Limits Praxis Praxis: Intact Praxis Impairment Details: Motor planning(Pt demo'd difficulty with motor planning to perform basic fxnl mobility maintaining precautions) Exercises:   Other Treatments:    See Function Navigator for Current Functional Status.   Therapy/Group: Individual Therapy  [name] 09/22/2017, 2:58 PM

## 2017-09-22 NOTE — Patient Care Conference (Signed)
Inpatient RehabilitationTeam Conference and Plan of Care Update Date: 09/22/2017   Time: 10:05 AM    Patient Name: Sarah [name]      Medical Record Number: [id number]  Date of Birth: 1966-01-07 Sex: Female         Room/Bed: 4W18C/4W18C-01 Payor Info: Payor: [employer] MEDICARE / Plan: [facility] MEDICARE / Product Type: *No Product type* /    Admitting Diagnosis: T4 Para  Admit Date/Time:  09/21/2017  4:57 PM Admission Comments: No comment available   Primary Diagnosis:  <principal problem not specified> Principal Problem: <principal problem not specified>  Patient Active Problem List   Diagnosis Date Noted  . Myelopathy (Sarah [name]) 09/21/2017  . Muscle spasms of both lower extremities   . Closed fracture of shaft of left femur (Sarah [name])   . Acute blood loss anemia   . Hypothyroidism   . Nausea & vomiting 09/15/2017  . Closed fracture of left distal femur (Sarah [name]) 09/11/2017  . Paraplegia (Sarah [name]) 09/08/2017  . Obesity (BMI 30-39.9) 09/03/2017  . Status post surgery 09/03/2017  . Calculus of gallbladder without cholecystitis without obstruction   . Biliary dyskinesia   . Intractable nausea and vomiting 08/09/2017  . Thrombocytopenia (Sarah [name]) 08/09/2017  . Elevated bilirubin 08/09/2017  . AKI (acute kidney injury) (Sarah [name]) 08/09/2017  . Nasal congestion 04/05/2017  . Breast pain 01/26/2017  . Upper respiratory infection, viral 01/26/2017  . Counseling regarding advanced care planning and goals of care 01/02/2017  . Bilateral rotator cuff syndrome 02/12/2016  . E. coli UTI 08/01/2015  . Constipation due to neurogenic bowel 08/01/2015  . Muscle spasticity   . 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 Spastic paraparesis   . Neurogenic bladder   . History of pulmonary embolism   . Post-operative pain   . Metastatic cancer (Sarah [name])   . Steroid-induced hyperglycemia   . Depression   . Obstipation   . Thoracic spine tumor 07/06/2015  . 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 (Sarah [name]) 07/06/2015  . Spinal  cord compression due to malignant neoplasm metastatic to spine (Sarah [name])   . Postoperative hypothyroidism   . Secondary malignant neoplasm of vertebral column (Sarah [name]) 03/08/2015  . Spasticity 09/19/2014  . Dysuria 09/04/2014  . Hypertension 05/01/2014  . Ingrown right big toenail 05/01/2014  . GERD (gastroesophageal reflux disease) 02/14/2014  . Dysphagia, pharyngoesophageal phase 02/06/2014  . Type 2 diabetes mellitus without complication ([place]) 79/05/8331  . Constipation   . Neurogenic bowel   . Paraplegia at T4 level (Sarah [name]) 01/20/2014  . Metastatic cancer to spine (Sarah [name]) 01/19/2014  . Rotator cuff tendonitis   . 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 Hurthle cell carcinoma of thyroid (Sarah [name]) 12/29/2013  . Leg weakness, bilateral 12/13/2013    Expected Discharge Date: Expected Discharge Date: (2-3 weeks)  Team Members Present: Physician leading conference: Dr. Delice Lesch Social Worker Present: Lennart Pall, LCSW Nurse Present: Rayetta Humphrey, RN PT Present: Dwyane Dee, PT OT Present: Roanna Epley, Verdie Mosher, OT SLP Present: Charolett Bumpers, SLP PPS Coordinator present : Daiva Nakayama, RN, CRRN     Current Status/Progress Goal Weekly Team Focus  Medical   nausea and vomiting resolved, dense paraplegia  maintain med stability  Increase activity tolerance, initiate bowel and bladder program   Bowel/Bladder   incontinent of bowel and bladder, LBM 09-20-17, patient previously on bowel program  able to manage bowel and bladder assistance  timed toileting q2 , assess bowel pattern q shift   Swallow/Nutrition/ Hydration  ADL's   max A for LB bathing and dressing; mod A +2 for slide board transfers bed<>w/c  setup/ supervision overall   transfer training, ADL retraining at bed level, toileting and toilet transfers, dyanmic sitting balance    Mobility   eval pending         Communication             Safety/Cognition/ Behavioral Observations             Pain    no c/o pain    no pain   assess pain q shift and prn   Skin    sutures to left knee and hip and incision to back daily dressing change   healing back incision, no new skin issues   perform dressing changes as ordered, assess skin q shift and prn    Rehab Goals Patient on target to meet rehab goals: Yes *See Care Plan and progress notes for long and short-term goals.     Barriers to Discharge  Current Status/Progress Possible Resolutions Date Resolved   Physician    Medical stability;Neurogenic Bowel & Bladder     tolerating rehab program  see above      Nursing                  PT  Decreased caregiver support;Weight bearing restrictions  Pt does not have 24/7 support at home              OT Pending chemo/radiation                SLP                SW                Discharge Planning/Teaching Needs:  still to be confirmed - per chart, plan home with family who can provide only intermittent assistance.  Teaching needs still to be determined.   Team Discussion:  New eval of returning pt.  Anticipating 2-3 week LOS with mod ind - min assist goals overall  Revisions to Treatment Plan:      Continued Need for Acute Rehabilitation Level of Care: The patient requires daily medical management by a physician with specialized training in physical medicine and rehabilitation for the following conditions: Daily direction of a multidisciplinary physical rehabilitation program to ensure safe treatment while eliciting the highest outcome that is of practical value to the patient.: Yes Daily medical management of patient stability for increased activity during participation in an intensive rehabilitation regime.: Yes Daily analysis of laboratory values and/or radiology reports with any subsequent need for medication adjustment of medical intervention for : Neurological problems;Wound care problems;Post surgical problems;Urological problems  [name] 09/24/2017, 10:28 AM

## 2017-09-22 NOTE — Progress Notes (Signed)
Patient information reviewed and entered into eRehab system by Daiva Nakayama, RN, CRRN, [place] Coordinator.  Information including medical coding and functional independence measure will be reviewed and updated through discharge.     Per nursing patient was given "Data Collection Information Summary for Patients in Inpatient Rehabilitation Facilities with attached "Privacy Act [place] Records" upon admission.  Patient readmitted to CIR past 4 day stay on acute services.

## 2017-09-22 NOTE — Progress Notes (Signed)
Subjective/Complaints:   Objective: Vital Signs: Blood pressure 112/72, pulse (!) 58, temperature 98.2 F (36.8 C), resp. rate 18, height _0  (1.803 m), weight 103.4 kg (227 lb 15.3 oz), last menstrual period 10/22/2016, SpO2 100 %. No results found. Results for orders placed or performed during the hospital encounter of 09/21/17 (from the past 72 hour(s))  Glucose, capillary     Status: Abnormal   Collection Time: 09/21/17  5:14 PM  Result Value Ref Range   Glucose-Capillary 107 (H) 70 - 99 mg/dL  Glucose, capillary     Status: Abnormal   Collection Time: 09/21/17  8:31 PM  Result Value Ref Range   Glucose-Capillary 115 (H) 70 - 99 mg/dL  Glucose, capillary     Status: Abnormal   Collection Time: 09/21/17 10:10 PM  Result Value Ref Range   Glucose-Capillary 125 (H) 70 - 99 mg/dL  Glucose, capillary     Status: Abnormal   Collection Time: 09/21/17 11:57 PM  Result Value Ref Range   Glucose-Capillary 108 (H) 70 - 99 mg/dL  Glucose, capillary     Status: Abnormal   Collection Time: 09/22/17  4:08 AM  Result Value Ref Range   Glucose-Capillary 104 (H) 70 - 99 mg/dL  CBC WITH DIFFERENTIAL     Status: Abnormal   Collection Time: 09/22/17  6:25 AM  Result Value Ref Range   WBC 4.7 4.0 - 10.5 K/uL   RBC 2.77 (L) 3.87 - 5.11 MIL/uL   Hemoglobin 8.6 (L) 12.0 - 15.0 g/dL   HCT 26.8 (L) 36.0 - 46.0 %   MCV 96.8 78.0 - 100.0 fL   MCH 31.0 26.0 - 34.0 pg   MCHC 32.1 30.0 - 36.0 g/dL   RDW 19.3 (H) 11.5 - 15.5 %   Platelets 143 (L) 150 - 400 K/uL   Neutrophils Relative % 76 %   Neutro Abs 3.6 1.7 - 7.7 K/uL   Lymphocytes Relative 18 %   Lymphs Abs 0.9 0.7 - 4.0 K/uL   Monocytes Relative 4 %   Monocytes Absolute 0.2 0.1 - 1.0 K/uL   Eosinophils Relative 0 %   Eosinophils Absolute 0.0 0.0 - 0.7 K/uL   Basophils Relative 0 %   Basophils Absolute 0.0 0.0 - 0.1 K/uL   Immature Granulocytes 2 %   Abs Immature Granulocytes 0.1 0.0 - 0.1 K/uL    Comment: Performed at [facility], [address], [place], [place] [address]  Comprehensive metabolic panel     Status: Abnormal   Collection Time: 09/22/17  6:25 AM  Result Value Ref Range   Sodium 135 135 - 145 mmol/L   Potassium 4.7 3.5 - 5.1 mmol/L   Chloride 100 98 - 111 mmol/L    Comment: Please note change in reference range.   CO2 29 22 - 32 mmol/L   Glucose, Bld 97 70 - 99 mg/dL    Comment: Please note change in reference range.   BUN 17 6 - 20 mg/dL    Comment: Please note change in reference range.   Creatinine, Ser 0.86 0.44 - 1.00 mg/dL   Calcium 8.0 (L) 8.9 - 10.3 mg/dL   Total Protein 4.5 (L) 6.5 - 8.1 g/dL   Albumin 2.7 (L) 3.5 - 5.0 g/dL   AST 17 15 - 41 U/L   ALT 15 0 - 44 U/L    Comment: Please note change in reference range.   Alkaline Phosphatase 129 (H) 38 - 126 U/L  Total Bilirubin 2.7 (H) 0.3 - 1.2 mg/dL   GFR calc non Af Amer >60 >60 mL/min   GFR calc Af Amer >60 >60 mL/min    Comment: (NOTE) The eGFR has been calculated using the CKD EPI equation. This calculation has not been validated in all clinical situations. eGFR's persistently <60 mL/min signify possible Chronic Kidney Disease.    Anion gap 6 5 - 15    Comment: Performed at [facility] [address] [place], [place] [address]     HEENT: normal Cardio: RRR and no murmur Resp: CTA B/L and unlabored GI: BS positive and NT, ND Extremity:  Pulses positive and No Edema Skin:   Wound Ecchymosis but otherwise no drainage Neuro: Alert/Oriented, Abnormal Sensory absent motor and sensory below T2 and Abnormal Motor 5/5 BUE, 0/5 BLE, + babinski bilateral Musc/Skel:  Other no pain with UE AROM, no pain with LE PROM Gen NAD   Assessment/Plan: 1. Functional deficits secondary to T3 paraplegia which require 3+ hours per day of interdisciplinary therapy in a comprehensive inpatient rehab setting. Physiatrist is providing close team supervision and 24 hour management of active medical problems listed  below. Physiatrist and rehab team continue to assess barriers to discharge/monitor patient progress toward functional and medical goals. FIM:                   Function - Comprehension Comprehension: Auditory Comprehension assist level: Understands complex 90% of the time/cues 10% of the time  Function - Expression Expression: Verbal Expression assist level: Expresses complex ideas: With extra time/assistive device  Function - Social Interaction Social Interaction assist level: Interacts appropriately with others with medication or extra time (anti-anxiety, antidepressant).  Function - Problem Solving Problem solving assist level: Solves complex 90% of the time/cues < 10% of the time  Function - Memory Memory assist level: Complete Independence: No helper   Medical Problem List and Plan: 1.  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 Initiate rehab Re evals 2.  DVT Prophylaxis/Anticoagulation/history of pulmonary emboli: Xarelto.  Monitor for any bleeding episodes 3. Pain Management: Baclofen 20 mg nightly, 4. Mood: Zoloft 25 mg daily.  Provide emotional support 5. Neuropsych: This patient is capable of making decisions on her own behalf. 6. Skin/Wound Care: Routine skin checks 7. Fluids/Electrolytes/Nutrition: Routine in and outs with follow-up chemistries 8.  Left femoral shaft fracture.  Status post intramedullary retrograde nailing 09/11/2017.  Nonweightbearing. 9.  Acute blood loss anemia.  Follow-up CBC 7/2 , stable 10.  Hyperglycemia secondary to steroid.  SSI. CBG (last 3)  Recent Labs    09/21/17 2210 09/21/17 2357 09/22/17 0408  GLUCAP 125* 108* 104*  Controlled 7/2 11.  Neurogenic bowel/ bladder.  Established bowel program 12.  Hypothyroidism.  Synthroid 13.  Mild thrombocytopenia r- monitor   LOS (Days) 1 A FACE TO FACE EVALUATION WAS  PERFORMED  [name] 09/22/2017, 8:21 AM

## 2017-09-23 ENCOUNTER — Inpatient Hospital Stay (HOSPITAL_COMMUNITY): Payer: Medicare Other

## 2017-09-23 ENCOUNTER — Inpatient Hospital Stay (HOSPITAL_COMMUNITY): Payer: Medicare Other | Admitting: Physical Therapy

## 2017-09-23 ENCOUNTER — Encounter (HOSPITAL_COMMUNITY): Payer: Medicare Other | Admitting: Psychology

## 2017-09-23 ENCOUNTER — Inpatient Hospital Stay (HOSPITAL_COMMUNITY): Payer: Medicare Other | Admitting: Occupational Therapy

## 2017-09-23 ENCOUNTER — Ambulatory Visit: Payer: Medicare Other | Admitting: Radiation Oncology

## 2017-09-23 LAB — GLUCOSE, CAPILLARY
GLUCOSE-CAPILLARY: 107 mg/dL — AB (ref 70–99)
GLUCOSE-CAPILLARY: 134 mg/dL — AB (ref 70–99)
GLUCOSE-CAPILLARY: 116 mg/dL — AB (ref 70–99)
Glucose-Capillary: 156 mg/dL — ABNORMAL HIGH (ref 70–99)

## 2017-09-23 MED ORDER — FLUTICASONE PROPIONATE 50 MCG/ACT NA SUSP
1.0000 | Freq: Every day | NASAL | Status: DC
  Administered 2017-09-24 – 2017-09-29 (×7): 1 via NASAL

## 2017-09-23 MED ORDER — BETHANECHOL CHLORIDE 10 MG PO TABS
5.0000 mg | ORAL_TABLET | Freq: Three times a day (TID) | ORAL | Status: DC
  Administered 2017-09-23 (×3): 5 mg via ORAL
  Filled 2017-09-23 (×4): qty 1

## 2017-09-23 MED ORDER — BISACODYL 10 MG RE SUPP
10.0000 mg | Freq: Every day | RECTAL | Status: DC | PRN
  Administered 2017-09-23 – 2017-09-28 (×5): 10 mg via RECTAL
  Filled 2017-09-23 (×5): qty 1

## 2017-09-23 MED ORDER — INSULIN ASPART 100 UNIT/ML ~~LOC~~ SOLN: 0.0000 [IU] | Freq: Three times a day (TID) | SUBCUTANEOUS | Status: DC

## 2017-09-23 MED ORDER — FLEET ENEMA 7-19 GM/118ML RE ENEM: 1.0000 | ENEMA | Freq: Every day | RECTAL | Status: DC | PRN

## 2017-09-23 NOTE — Progress Notes (Signed)
Physical Therapy Session Note  Patient Details  Name: Sarah [name] MRN: [id number] Date of Birth: 1965/07/20  Today's Date: 09/23/2017 PT Individual Time: 2481-8590 PT Individual Time Calculation (min): 60 min   Short Term Goals: Week 1:  PT Short Term Goal 1 (Week 1): Pt will perform bed mobility with min A  PT Short Term Goal 2 (Week 1): Pt will be able to position slide board and complete transfer with min A  PT Short Term Goal 3 (Week 1): Pt will propel w/c with SPV for >500 ft  PT Short Term Goal 4 (Week 1): Pt will perform car transfer with mod A +1  Skilled Therapeutic Interventions/Progress Updates:   W/c propulsion using bil UEs on level tile with supervision; cues for efficiency and turns.  neuromuscular re-education via multimodal cues for trunk flexion and forward wt shifting , in w/c to reach forward with R/L hand to manipulate clothes pins onto rod.  PT educated pt on avoiding Valsalva maneuver when exerting herself.   Slide board transfer w/c> mat to L, with mod assist, for placing board, moving bil feet on/off foortests, and  wt shifting. L with min assist/ R lateral leans with assistance to keep feet stable; L forearm on 2 pillows for less excursion.  PT educated pt on use of head for wt shifting, with improved success raising herself up from L side. In L/R forearm wt bearing in sitting, R/L forward/backward shifting of pelvis with assistance, to facilitate scooting.   Slide board transfer to return to w/c, to R, with mod assist, placing her feet on foot rests partway through transfer to facilitate scooting.    Pt passed to [place], OT for next session. Therapy Documentation Precautions:  Precautions Precautions: Fall Precaution Comments: throracic region, LEs spasicity  Restrictions Weight Bearing Restrictions: Yes LLE Weight Bearing: Non weight bearing Other Position/Activity Restrictions: LLE NWB    Pain: 6/10 upper back/ near scapulae; Ed, RN dispensed meds  during session     See Function Navigator for Current Functional Status.   Therapy/Group: Individual Therapy  Marishka Rentfrow 09/23/2017, 3:25 PM

## 2017-09-23 NOTE — Progress Notes (Signed)
Occupational Therapy Note  Patient Details  Name: Sarah [name] MRN: [id number] Date of Birth: 18-Jan-1966  Today's Date: 09/23/2017 OT Missed Time: 10 Minutes Missed Time Reason: Patient unwilling/refused to participate without medical reason  Pt unaware of scheduled therapy session and declined visit secondary to currently having outside visitors present in room for appointment.    [name] 09/23/2017, 3:51 PM

## 2017-09-23 NOTE — Progress Notes (Signed)
Subjective/Complaints: No new issues.  Pt Has no sensation for bowel or bladder   ROS- denies CP, SOB, N/V/D Objective: Vital Signs: Blood pressure 103/74, pulse 68, temperature 98.1 F (36.7 C), temperature source Oral, resp. rate 17, height 5' 11"  (1.803 m), weight 103.4 kg (227 lb 15.3 oz), last menstrual period 10/22/2016, SpO2 100 %. No results found. Results for orders placed or performed during the hospital encounter of 09/21/17 (from the past 72 hour(s))  Glucose, capillary     Status: Abnormal   Collection Time: 09/21/17  5:14 PM  Result Value Ref Range   Glucose-Capillary 107 (H) 70 - 99 mg/dL  Glucose, capillary     Status: Abnormal   Collection Time: 09/21/17  8:31 PM  Result Value Ref Range   Glucose-Capillary 115 (H) 70 - 99 mg/dL  Glucose, capillary     Status: Abnormal   Collection Time: 09/21/17 10:10 PM  Result Value Ref Range   Glucose-Capillary 125 (H) 70 - 99 mg/dL  Glucose, capillary     Status: Abnormal   Collection Time: 09/21/17 11:57 PM  Result Value Ref Range   Glucose-Capillary 108 (H) 70 - 99 mg/dL  Glucose, capillary     Status: Abnormal   Collection Time: 09/22/17  4:08 AM  Result Value Ref Range   Glucose-Capillary 104 (H) 70 - 99 mg/dL  CBC WITH DIFFERENTIAL     Status: Abnormal   Collection Time: 09/22/17  6:25 AM  Result Value Ref Range   WBC 4.7 4.0 - 10.5 K/uL   RBC 2.77 (L) 3.87 - 5.11 MIL/uL   Hemoglobin 8.6 (L) 12.0 - 15.0 g/dL   HCT 26.8 (L) 36.0 - 46.0 %   MCV 96.8 78.0 - 100.0 fL   MCH 31.0 26.0 - 34.0 pg   MCHC 32.1 30.0 - 36.0 g/dL   RDW 19.3 (H) 11.5 - 15.5 %   Platelets 143 (L) 150 - 400 K/uL   Neutrophils Relative % 76 %   Neutro Abs 3.6 1.7 - 7.7 K/uL   Lymphocytes Relative 18 %   Lymphs Abs 0.9 0.7 - 4.0 K/uL   Monocytes Relative 4 %   Monocytes Absolute 0.2 0.1 - 1.0 K/uL   Eosinophils Relative 0 %   Eosinophils Absolute 0.0 0.0 - 0.7 K/uL   Basophils Relative 0 %   Basophils Absolute 0.0 0.0 - 0.1 K/uL    Immature Granulocytes 2 %   Abs Immature Granulocytes 0.1 0.0 - 0.1 K/uL    Comment: Performed at [facility], [address] [place], [place] [address]  Comprehensive metabolic panel     Status: Abnormal   Collection Time: 09/22/17  6:25 AM  Result Value Ref Range   Sodium 135 135 - 145 mmol/L   Potassium 4.7 3.5 - 5.1 mmol/L   Chloride 100 98 - 111 mmol/L    Comment: Please note change in reference range.   CO2 29 22 - 32 mmol/L   Glucose, Bld 97 70 - 99 mg/dL    Comment: Please note change in reference range.   BUN 17 6 - 20 mg/dL    Comment: Please note change in reference range.   Creatinine, Ser 0.86 0.44 - 1.00 mg/dL   Calcium 8.0 (L) 8.9 - 10.3 mg/dL   Total Protein 4.5 (L) 6.5 - 8.1 g/dL   Albumin 2.7 (L) 3.5 - 5.0 g/dL   AST 17 15 - 41 U/L   ALT 15 0 - 44 U/L  Comment: Please note change in reference range.   Alkaline Phosphatase 129 (H) 38 - 126 U/L   Total Bilirubin 2.7 (H) 0.3 - 1.2 mg/dL   GFR calc non Af Amer >60 >60 mL/min   GFR calc Af Amer >60 >60 mL/min    Comment: (NOTE) The eGFR has been calculated using the CKD EPI equation. This calculation has not been validated in all clinical situations. eGFR's persistently <60 mL/min signify possible Chronic Kidney Disease.    Anion gap 6 5 - 15    Comment: Performed at [facility] [address], [place], [place] [address]  Glucose, capillary     Status: Abnormal   Collection Time: 09/22/17 12:32 PM  Result Value Ref Range   Glucose-Capillary 139 (H) 70 - 99 mg/dL  Glucose, capillary     Status: Abnormal   Collection Time: 09/22/17  4:36 PM  Result Value Ref Range   Glucose-Capillary 125 (H) 70 - 99 mg/dL  Glucose, capillary     Status: Abnormal   Collection Time: 09/22/17  9:26 PM  Result Value Ref Range   Glucose-Capillary 128 (H) 70 - 99 mg/dL  Glucose, capillary     Status: Abnormal   Collection Time: 09/23/17  6:16 AM  Result Value Ref Range   Glucose-Capillary 107 (H) 70 - 99 mg/dL      HEENT: normal Cardio: RRR and no murmur Resp: CTA B/L and unlabored GI: BS positive and NT, ND Extremity:  Pulses positive and No Edema Skin:   Wound Ecchymosis but otherwise no drainage Neuro: Alert/Oriented, Abnormal Sensory absent motor and sensory below T2 and Abnormal Motor 5/5 BUE, 0/5 BLE, + babinski bilateral Musc/Skel:  Other no pain with UE AROM, no pain with LE PROM Gen NAD   Assessment/Plan: 1. Functional deficits secondary to T3 paraplegia which require 3+ hours per day of interdisciplinary therapy in a comprehensive inpatient rehab setting. Physiatrist is providing close team supervision and 24 hour management of active medical problems listed below. Physiatrist and rehab team continue to assess barriers to discharge/monitor patient progress toward functional and medical goals. FIM: Function - Bathing Position: Bed Body parts bathed by patient: Right arm, Left arm, Chest, Abdomen Body parts bathed by helper: Front perineal area, Buttocks, Right upper leg, Left upper leg, Right lower leg, Left lower leg, Back Assist Level: ((max A))  Function- Upper Body Dressing/Undressing What is the patient wearing?: Bra, Pull over shirt/dress Bra - Perfomed by patient: Thread/unthread right bra strap, Thread/unthread left bra strap Bra - Perfomed by helper: Hook/unhook bra (pull down sports bra) Pull over shirt/dress - Perfomed by patient: Thread/unthread right sleeve, Thread/unthread left sleeve, Put head through opening Pull over shirt/dress - Perfomed by helper: Pull shirt over trunk Assist Level: Touching or steadying assistance(Pt > 75%) Function - Lower Body Dressing/Undressing What is the patient wearing?: Pants, Ted Hose, Shoes Position: Bed Pants- Performed by helper: Thread/unthread right pants leg, Thread/unthread left pants leg, Pull pants up/down Shoes - Performed by helper: Don/doff right shoe, Don/doff left shoe, Fasten right, Fasten left TED Hose - Performed  by helper: Don/doff right TED hose, Don/doff left TED hose Assist for footwear: Dependant Assist for lower body dressing: Touching or steadying assistance (Pt > 75%)     Function - Air cabin crew transfer activity did not occur: Safety/medical concerns  Function - Chair/bed transfer Chair/bed transfer method: Lateral scoot Chair/bed transfer assist level: Maximal assist (Pt 25 - 49%/lift and lower) Chair/bed transfer assistive device: Sliding board Chair/bed  transfer details: Tactile cues for weight shifting, Tactile cues for sequencing, Verbal cues for technique, Verbal cues for precautions/safety, Manual facilitation for placement, Verbal cues for sequencing  Function - Locomotion: Wheelchair Will patient use wheelchair at discharge?: Yes Type: Manual Max wheelchair distance: >500 Assist Level: Touching or steadying assistance (Pt > 75%) Assist Level: Touching or steadying assistance (Pt > 75%) Assist Level: Touching or steadying assistance (Pt > 75%) Turns around,maneuvers to table,bed, and toilet,negotiates 3% grade,maneuvers on rugs and over doorsills: Yes Function - Locomotion: Ambulation Ambulation activity did not occur: Safety/medical concerns Walk 10 feet activity did not occur: Safety/medical concerns Walk 50 feet with 2 turns activity did not occur: Safety/medical concerns Walk 150 feet activity did not occur: Safety/medical concerns Walk 10 feet on uneven surfaces activity did not occur: Safety/medical concerns  Function - Comprehension Comprehension: Auditory Comprehension assist level: Understands complex 90% of the time/cues 10% of the time  Function - Expression Expression: Verbal Expression assist level: Expresses complex ideas: With extra time/assistive device  Function - Social Interaction Social Interaction assist level: Interacts appropriately with others with medication or extra time (anti-anxiety, antidepressant).  Function - Problem  Solving Problem solving assist level: Solves complex 90% of the time/cues < 10% of the time  Function - Memory Memory assist level: Complete Independence: No helper   Medical Problem List and Plan: 1.  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  DVT Prophylaxis/Anticoagulation/history of pulmonary emboli: Xarelto.  Monitor for any bleeding episodes 3. Pain Management: Baclofen 20 mg nightly, 4. Mood: Zoloft 25 mg daily.  Provide emotional support 5. Neuropsych: This patient is capable of making decisions on her own behalf. 6. Skin/Wound Care: Routine skin checks, surgical sites CDI 7. Fluids/Electrolytes/Nutrition: Routine in and outs with follow-up chemistries 8.  Left femoral shaft fracture.  Status post intramedullary retrograde nailing 09/11/2017.  Nonweightbearing. 9.  Acute blood loss anemia.  Follow-up CBC 7/2 , stable 10.  Hyperglycemia secondary to steroid.  SSI. CBG (last 3)  Recent Labs    09/22/17 1636 09/22/17 2126 09/23/17 0616  GLUCAP 125* 128* 107*  Controlled 7/3 11.  Neurogenic bowel/ bladder.  Established bowel program, trial urecholine 12.  Hypothyroidism.  Synthroid 13.  Mild thrombocytopenia r- monitor   LOS (Days) 2 A FACE TO FACE EVALUATION WAS PERFORMED  [name] 09/23/2017, 8:23 AM

## 2017-09-23 NOTE — Progress Notes (Signed)
Physical Therapy Session Note  Patient Details  Name: Sarah [name] MRN: [id number] Date of Birth: [birth year]  Today's Date: 09/23/2017 PT Individual Time: 1130-1200 PT Individual Time Calculation (min): 30 min   Short Term Goals: Week 1:  PT Short Term Goal 1 (Week 1): Pt will perform bed mobility with min A  PT Short Term Goal 2 (Week 1): Pt will be able to position slide board and complete transfer with min A  PT Short Term Goal 3 (Week 1): Pt will propel w/c with SPV for >500 ft  PT Short Term Goal 4 (Week 1): Pt will perform car transfer with mod A +1  Skilled Therapeutic Interventions/Progress Updates:    Pt seated in w/c upon PT arrival, agreeable to therapy tx and denies pain. Pt propelled w/c from room>gym x 150 ft with supervision using B UEs. Pt requesting to work on UE strength this session. Pt performed UE strengthening exercises to include 2 x 10 of the following: bicep curls 3# dumbbells, shoulder flexion 3# dumbbells, shoulder abduction with 1#, shoulder press with 3#, therapist providing cues throughout for techniques/proper form. Pt worked on UE strength and seated balance to hit beach ball back and forth, x 1 trials with 1# dowel and x 1 trials with 3# dowel. Pt propelled w/c back to room with supervision x 150 ft, left seated with needs in reach.   Therapy Documentation Precautions:  Precautions Precautions: Fall Precaution Comments: throracic region, LEs spasicity  Restrictions Weight Bearing Restrictions: Yes LLE Weight Bearing: Non weight bearing Other Position/Activity Restrictions: LLE NWB   See Function Navigator for Current Functional Status.   Therapy/Group: Individual Therapy  Netta Corrigan, PT, DPT 09/23/2017, 11:32 AM

## 2017-09-23 NOTE — Consult Note (Signed)
Neuropsychological Consultation   Patient:   Sarah [name]   DOB:   12/09/1965  MR Number:  [id number]  Location:  [facility] [address] [id number] [place] [address] Dept: [phone] QIW: [phone]           Date of Service:   09/23/2017  Start Time:   1 PM End Time:   2 PM  Provider/Observer:  Ilean Skill, Psy.D.       Clinical Neuropsychologist       Billing Code/Service: 17408 4 Units  Chief Complaint:    Sarah [name]. 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  Patient has had multiple prior admissions.  Presented 09/02/2017 with increasing leg weakness over prior two weeks.  [name] being followed by oncology as well as neurosurgery (Dr. Christella Noa).  I had seen patient during prior admissions to [facility].    Reason for Service:  Sarah [name] was referred for neuropsychological/psychological consultation due to coping and adjustment issues.  Below is the HPI for the current admission.  HPI: Sarah [name]. 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  Per chart review she lives with sister and brother-in-law.  One level home with ramped entrance.  Up until recently she was using a sliding board for transfers and ambulating short distances with a rolling walker up until May 2019.  She had been using a wheelchair most the time for the past few years otherwise.  She has had 2 previous back surgeries T3 in 2015 and 2017 he did receive inpatient rehab services.  Presented 09/02/2017 with recent fall increasing leg weakness over the past 2 weeks.  She was to see her oncologist 08/24/2017 for follow-up MRI that was planned however could not make the appointment due to lower extremity weakness.   She did also have some urinary incontinence with noted neurogenic bladder.  MRI and imaging revealed tumor recurrence at T3 with severe spinal cord compression.  Underwent posterior spinal tumor resection T3 09/03/2017 per Dr. Christella Noa.  Radiation oncology follow-up Dr. Dara Lords to follow-up outpatient for plan radiation therapy.  Decadron protocol was indicated.  Hospital course anemia and monitored.  E. coli UTI completed a course of Rocephin.  Physical and occupational therapy evaluations completed and patient was admitted for a comprehensive rehab program 09/08/2017.  Follow-up labs on admission to rehab services showed hemoglobin 6.2 that was confirmed and patient was transfused.  Patient's initial plan was to resume Xarelto for history of pulmonary emboli but held due to anemia.  CT of lumbar and thoracic spine as well as pelvis were unremarkable.  Hemoglobin rebounded 8.7.  Patient with ongoing complaints of left knee pain and some effusion.  X-rays and imaging revealed left femoral shaft fracture likely felt to be subacute with noted history of recent fall.  Orthopedic services consulted she was discharged to acute care services and underwent intramedullary retrograde femoral nailing 09/11/2017 per Dr. Marcelino Scot.  Nonweightbearing left lower extremity.  Her Xarelto was resumed postoperatively.  Close monitoring of hemoglobin and hematocrit.  Physical and Occupational Therapy evaluations completed postoperatively 6/22 and patient was admitted to inpatient rehab services 09/15/2017.  Upon admission to inpatient rehab services patient with intractable nausea vomiting noted bouts of constipation.  She was again  discharged back to acute care services.  Abdominal film showed nonobstructive bowel gas pattern.  Moderate colorectal stool volume indicating constipation.  Ultrasound the abdomen showed cholelithiasis without complicating factors.  Bowel program was established.  Nausea vomiting greatly improved.  Patient again  is readmitted back to inpatient rehab services for comprehensive rehab program.  Current Status:  Sarah [name] was oriented x4 with good cognitive and executive functioning today.  Patient reports that her mood is good given all that has been happening.  Patient reprots that she has been feeling more anxious due to not know current play from oncology and neurosurgery.  Patient also has fears about being transferred to SNF following discharge from CIR due to conversations had during time on prior unit.    Behavioral Observation: Sarah [name]  presents as a 52 y.o.-year-old Right Caucasian Female who appeared her stated age. her dress was Appropriate and she was Well Groomed and her manners were Appropriate to the situation.  her participation was indicative of Appropriate and Attentive behaviors.  There were any physical disabilities noted.  she displayed an appropriate level of cooperation and motivation.     Interactions:    Active Appropriate and Attentive  Attention:   within normal limits and attention span and concentration were age appropriate  Memory:   within normal limits; recent and remote memory intact  Visuo-spatial:  not examined  Speech (Volume):  normal  Speech:   normal; normal  Thought Process:  Coherent and Relevant  Though Content:  WNL; not suicidal and not homicidal  Orientation:   person, place, time/date and situation  Judgment:   Good  Planning:   Good  Affect:    Appropriate  Mood:    Anxious  Insight:   Good  Intelligence:   normal   Medical History:   Past Medical History:  Diagnosis Date  . Cancer [facility])    FNA of thyroid positive for onconytic/hurthle cell carcinoma  . Chronic back pain   . DDD (degenerative disc disease), cervical   . DJD (degenerative joint disease)   . Family history of adverse reaction to anesthesia    MOTHER HAS NAUSEA   . History of rectal fissure   . HIT (heparin-induced thrombocytopenia) ([place])   . Hypertension    . Obesity   . 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 Pulmonary embolus, right ([place]) 2015  . Rotator cuff tendonitis right   Psychiatric History:  While the patient has no prior psychiatric history she has been dealing with stage IV  Hurthle cell carcinoma with mets to the spine for several years now.  Family Med/Psych History:  Family History  Problem Relation Age of Onset  . Hypertension Mother   . Diabetes Mother   . Hypertension Father   . Stroke Father   . Diabetes Father   . Diabetes Sister   . Diabetes Sister     Risk of Suicide/Violence: low Patient denies SI or HI.  Impression/DX:  Bo Mcclintock. 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  Patient has had multiple prior admissions.  Presented 09/02/2017 with increasing leg weakness over prior two weeks.  [name] being followed by oncology as well as neurosurgery (Dr. Christella Noa).  I had seen patient during prior admissions to [facility].   Ila Landowski was oriented x4 with good cognitive and executive functioning today.  Patient reports that her mood is good given  all that has been happening.  Patient reprots that she has been feeling more anxious due to not know current play from oncology and neurosurgery.  Patient also has fears about being transferred to SNF following discharge from [facility] due to conversations had during time on prior unit.   Today, worked on coping and working through stressors regarding next steps in her medical care and stressors around discharge planning.      Electronically Signed   _______________________ Ilean Skill, Psy.D.

## 2017-09-23 NOTE — Progress Notes (Signed)
Physical Therapy Session Note  Patient Details  Name: Sarah [name] MRN: [id number] Date of Birth: September 16, 1965  Today's Date: 09/23/2017 PT Individual Time: 1130-1200 PT Individual Time Calculation (min): 30 min   Short Term Goals: Week 1:  PT Short Term Goal 1 (Week 1): Pt will perform bed mobility with min A  PT Short Term Goal 2 (Week 1): Pt will be able to position slide board and complete transfer with min A  PT Short Term Goal 3 (Week 1): Pt will propel w/c with SPV for >500 ft  PT Short Term Goal 4 (Week 1): Pt will perform car transfer with mod A +1  Skilled Therapeutic Interventions/Progress Updates:    Pt reported no pain/discomfort prior to treatment session onset. PT initiated bed level bathing with wash cloths for hygiene maintenance. Pt performed UE bathing and don/doff shirt and bra with min A for prevention of bra strap catching on upper thoracic dressing placement. PT provided total A for LE bathing and donning of pants/ shoes with Pt performing L & R rolling to facilitate donning of pants over pelvis. Pt required max A lifting from side lying > sitting LE management and trunk elevation to achieve short sitting EOB. Pt performed L lateral trunk lean and provided mod A for placing board correctly for efficiency with lateral scoot transfer. Pt consistently performed lateral scoot transfers t/o session with mod fading to max assist with PT providing max multimodal cues for maintaining head hip relationship and UE placement for safety consideration. Pt provided max A for scooting posteriorly into chair for correct seated posture. Pt performed 3 sets/ 15 reps of seated press ups with push up blocks for UE tricep strengthening to progress towards efficiency with transfers and pressure relief. Pt performed lateral trunk flexion with contralateral UE reaching for target cone for 2 sets/ 3 trials on each side. Pt self propelled w/c from room<>therapy gym for UE strengthening during functional  mobility task. PT provided Pt with orange t-band and provided education with demonstrate on bicep and ER strengthening exercises in sitting. Pt was returned to room with tray table and call bell in reach and all needs met.   Therapy Documentation Precautions:  Precautions Precautions: Fall Precaution Comments: throracic region, LEs spasicity  Restrictions Weight Bearing Restrictions: Yes LLE Weight Bearing: Non weight bearing Other Position/Activity Restrictions: LLE NWB    Therapy/Group: Individual Therapy  Floreen Comber 09/23/2017, 1:04 PM

## 2017-09-24 ENCOUNTER — Inpatient Hospital Stay (HOSPITAL_COMMUNITY): Payer: Medicare Other | Admitting: Physical Therapy

## 2017-09-24 ENCOUNTER — Inpatient Hospital Stay (HOSPITAL_COMMUNITY): Payer: Medicare Other | Admitting: *Deleted

## 2017-09-24 ENCOUNTER — Inpatient Hospital Stay (HOSPITAL_COMMUNITY): Payer: Medicare Other | Admitting: Occupational Therapy

## 2017-09-24 LAB — GLUCOSE, CAPILLARY
GLUCOSE-CAPILLARY: 199 mg/dL — AB (ref 70–99)
GLUCOSE-CAPILLARY: 152 mg/dL — AB (ref 70–99)
Glucose-Capillary: 158 mg/dL — ABNORMAL HIGH (ref 70–99)
Glucose-Capillary: 134 mg/dL — ABNORMAL HIGH (ref 70–99)
Glucose-Capillary: 177 mg/dL — ABNORMAL HIGH (ref 70–99)

## 2017-09-24 NOTE — Progress Notes (Signed)
Occupational Therapy Session Note  Patient Details  Name: Sarah [name] MRN: [id number] Date of Birth: Jul 02, 1965  Today's Date: 09/24/2017 OT Individual Time: 0815-0900 OT Individual Time Calculation (min): 45 min    Short Term Goals: Week 1:  OT Short Term Goal 1 (Week 1): Pt will transfer to padded tub bench for toileting with mod A  OT Short Term Goal 2 (Week 1): Pt will maintain static sitting balance with one UE support with supervision  OT Short Term Goal 3 (Week 1): Pt will maintain dynamic sitting balance in prep for ADL with min A   Skilled Therapeutic Interventions/Progress Updates:    Upon entering the room, pt supine in bed with and requesting to wash and change clothing. Pt seated in bed with HOB elevated and pt utilizing basin bath with set up A to wash self. Pt donning UB clothing with assistance to pull bra down. Pt required assistance to thread pants over B feet. Pt rolling L <> R with min A and use of bed rail to pull pants over B hips with increased time. Pt performing supine >sit with mod A for B LEs to come into sitting position. Pt attempting to place slide board with adjustment from therapist for safety. Mod A transfer to the R with placement of R foot onto leg rest. Pt propelled wheelchair to sink for grooming and assistance to wash hair. Pt remained seated in wheelchair drying hair while therapist exited the room. Call bell and all needed items within reach upon exiting.   Therapy Documentation Precautions:  Precautions Precautions: Fall Precaution Comments: throracic region, LEs spasicity  Restrictions Weight Bearing Restrictions: Yes LLE Weight Bearing: Non weight bearing Other Position/Activity Restrictions: LLE NWB General:   Vital Signs:   Pain: Pain Assessment Pain Scale: 0-10 Pain Score: 3  Pain Type: Acute pain Pain Location: Back Pain Orientation: Mid Pain Descriptors / Indicators: Aching Pain Frequency: Intermittent Pain Onset:  On-going Patients Stated Pain Goal: 2 Pain Intervention(s): Medication (See eMAR) ADL: ADL ADL Comments: see functional navigator  See Function Navigator for Current Functional Status.   Therapy/Group: Individual Therapy  [name] 09/24/2017, 12:58 PM

## 2017-09-24 NOTE — Progress Notes (Signed)
Physical Therapy Session Note  Patient Details  Name: Sarah [name] MRN: [id number] Date of Birth: 07-05-1965  Today's Date: 09/24/2017 PT Individual Time:1000-1100 1200-1230 PT Individual Time Calculation (min): 60 min 30 min   Short Term Goals: Week 1:  PT Short Term Goal 1 (Week 1): Pt will perform bed mobility with min A  PT Short Term Goal 2 (Week 1): Pt will be able to position slide board and complete transfer with min A  PT Short Term Goal 3 (Week 1): Pt will propel w/c with SPV for >500 ft  PT Short Term Goal 4 (Week 1): Pt will perform car transfer with mod A +1      Skilled Therapeutic Interventions/Progress Updates:    Session 1:  Prior to therapy Pt indicated no pain and recently received Tylenol from staff members. Pt self propelled w/c <> therapy gym and PT initiated cone weaving with w/c navigating through tight obstacles to simulate home environment. Pt was able to perform cone weaving with supervision demonstrating ability to properly propel and turn w/c around tight turns. Pt consistently performed lateral scoot transfers with mod A from PT for setting up board, LE management assist for scooting in chair, and mod A for performing transfer. Pt was able to verbalize correct sequencing of steps and maintain head hip relationship t/o transfer duration. Pt preferenced having her leg rests applied when transferring for improved efficiency. PT initiated scooting >L using R forearm and L UE to initiate scoot; however, Pt was unable to achieve proper lift to successfully scoot to her L. Pt performed modified supine to sit using single wedge and x2 pillows for increased assistance. PT provided mod A at Pt shoulders to help her achieve short sitting position. PT attempted to have Pt go from modified supine to short sitting via c-sitting compensatory technique; however, Pt unable to achieve possibly due to dec UE strength. PT initiated dynamic sitting balance edge of mat reaching for close  pins and placing them laterally. Pt occasionally reached too far causing LOB to wedge placed on mat with ability to recover using R UE forearm. PT provided min A and multimodal cues to use contralateral UE and head as counter balance although Pt posture and forward head prevents her from successfully demonstrating those motions. Pt became easily frustrated with task due to frequent LOB episodes with inability to demonstrate carry over with compensatory counter balance techniques. PT initiated seated pec stretch for facilitation of improved posture to minimize rounding of shoulders in which Pt indicated felt really good. Pt self propelled w/c back to her room and was positioned sitting with call alarm and tray table in reach and all needs met.   Session 2: Prior to session, Pt was clearly upset when talking on the phone but did not want to talk about it. PT initiated seated UE strengthening with 3# weight for 3x 8-20 reps depending on fatigue level. Pt performed alternating bicep curls, external rotation, and shoulder flexion. Pt required min A from PT for L shoulder external rotation due to increased weakness. Pt required rest breaks in b/w each set. PT provided verbal cues for maintaining proper posture during exercises to achieve adequate muscular activation with strengthening task. Pt was left in chair with tray table and call bell in reach and all needs met.    Therapy Documentation Precautions:  Precautions Precautions: Fall Precaution Comments: throracic region, LEs spasicity  Restrictions Weight Bearing Restrictions: Yes LLE Weight Bearing: Non weight bearing Other Position/Activity Restrictions: LLE NWB  General:   Vital Signs:   Pain: Pain Assessment Pain Scale: 0-10 Pain Score: 3  Pain Type: Acute pain Pain Location: Back Pain Orientation: Mid Pain Descriptors / Indicators: Aching Pain Frequency: Intermittent Pain Onset: On-going Patients Stated Pain Goal: 2 Pain  Intervention(s): Medication (See eMAR) Mobility:   Locomotion :    Trunk/Postural Assessment :    Balance:   Exercises:   Other Treatments:     See Function Navigator for Current Functional Status.   Therapy/Group: Individual Therapy  Floreen Comber 09/24/2017, 12:43 PM

## 2017-09-24 NOTE — Progress Notes (Signed)
Subjective/Complaints:  No issues overnite , no pain issues, sitting balance improving per Pt  ROS- denies CP, SOB, N/V/D Objective: Vital Signs: Blood pressure 108/64, pulse 68, temperature 97.9 F (36.6 C), temperature source Oral, resp. rate 17, height 5' 11"  (1.803 m), weight 103.4 kg (227 lb 15.3 oz), last menstrual period 10/22/2016, SpO2 100 %. No results found. Results for orders placed or performed during the hospital encounter of 09/21/17 (from the past 72 hour(s))  Glucose, capillary     Status: Abnormal   Collection Time: 09/21/17  5:14 PM  Result Value Ref Range   Glucose-Capillary 107 (H) 70 - 99 mg/dL  Glucose, capillary     Status: Abnormal   Collection Time: 09/21/17  8:31 PM  Result Value Ref Range   Glucose-Capillary 115 (H) 70 - 99 mg/dL  Glucose, capillary     Status: Abnormal   Collection Time: 09/21/17 10:10 PM  Result Value Ref Range   Glucose-Capillary 125 (H) 70 - 99 mg/dL  Glucose, capillary     Status: Abnormal   Collection Time: 09/21/17 11:57 PM  Result Value Ref Range   Glucose-Capillary 108 (H) 70 - 99 mg/dL  Glucose, capillary     Status: Abnormal   Collection Time: 09/22/17  4:08 AM  Result Value Ref Range   Glucose-Capillary 104 (H) 70 - 99 mg/dL  CBC WITH DIFFERENTIAL     Status: Abnormal   Collection Time: 09/22/17  6:25 AM  Result Value Ref Range   WBC 4.7 4.0 - 10.5 K/uL   RBC 2.77 (L) 3.87 - 5.11 MIL/uL   Hemoglobin 8.6 (L) 12.0 - 15.0 g/dL   HCT 26.8 (L) 36.0 - 46.0 %   MCV 96.8 78.0 - 100.0 fL   MCH 31.0 26.0 - 34.0 pg   MCHC 32.1 30.0 - 36.0 g/dL   RDW 19.3 (H) 11.5 - 15.5 %   Platelets 143 (L) 150 - 400 K/uL   Neutrophils Relative % 76 %   Neutro Abs 3.6 1.7 - 7.7 K/uL   Lymphocytes Relative 18 %   Lymphs Abs 0.9 0.7 - 4.0 K/uL   Monocytes Relative 4 %   Monocytes Absolute 0.2 0.1 - 1.0 K/uL   Eosinophils Relative 0 %   Eosinophils Absolute 0.0 0.0 - 0.7 K/uL   Basophils Relative 0 %   Basophils Absolute 0.0 0.0 - 0.1  K/uL   Immature Granulocytes 2 %   Abs Immature Granulocytes 0.1 0.0 - 0.1 K/uL    Comment: Performed at [facility], [address], [place], [place] [address]  Comprehensive metabolic panel     Status: Abnormal   Collection Time: 09/22/17  6:25 AM  Result Value Ref Range   Sodium 135 135 - 145 mmol/L   Potassium 4.7 3.5 - 5.1 mmol/L   Chloride 100 98 - 111 mmol/L    Comment: Please note change in reference range.   CO2 29 22 - 32 mmol/L   Glucose, Bld 97 70 - 99 mg/dL    Comment: Please note change in reference range.   BUN 17 6 - 20 mg/dL    Comment: Please note change in reference range.   Creatinine, Ser 0.86 0.44 - 1.00 mg/dL   Calcium 8.0 (L) 8.9 - 10.3 mg/dL   Total Protein 4.5 (L) 6.5 - 8.1 g/dL   Albumin 2.7 (L) 3.5 - 5.0 g/dL   AST 17 15 - 41 U/L   ALT 15 0 - 44 U/L  Comment: Please note change in reference range.   Alkaline Phosphatase 129 (H) 38 - 126 U/L   Total Bilirubin 2.7 (H) 0.3 - 1.2 mg/dL   GFR calc non Af Amer >60 >60 mL/min   GFR calc Af Amer >60 >60 mL/min    Comment: (NOTE) The eGFR has been calculated using the CKD EPI equation. This calculation has not been validated in all clinical situations. eGFR's persistently <60 mL/min signify possible Chronic Kidney Disease.    Anion gap 6 5 - 15    Comment: Performed at [facility] [address] [place], [place] [address]  Glucose, capillary     Status: Abnormal   Collection Time: 09/22/17 12:32 PM  Result Value Ref Range   Glucose-Capillary 139 (H) 70 - 99 mg/dL  Glucose, capillary     Status: Abnormal   Collection Time: 09/22/17  4:36 PM  Result Value Ref Range   Glucose-Capillary 125 (H) 70 - 99 mg/dL  Glucose, capillary     Status: Abnormal   Collection Time: 09/22/17  9:26 PM  Result Value Ref Range   Glucose-Capillary 128 (H) 70 - 99 mg/dL  Glucose, capillary     Status: Abnormal   Collection Time: 09/23/17  6:16 AM  Result Value Ref Range   Glucose-Capillary 107 (H) 70 - 99  mg/dL  Glucose, capillary     Status: Abnormal   Collection Time: 09/23/17 12:01 PM  Result Value Ref Range   Glucose-Capillary 134 (H) 70 - 99 mg/dL   Comment 1 Notify RN   Glucose, capillary     Status: Abnormal   Collection Time: 09/23/17  4:49 PM  Result Value Ref Range   Glucose-Capillary 116 (H) 70 - 99 mg/dL   Comment 1 Notify RN   Glucose, capillary     Status: Abnormal   Collection Time: 09/23/17 10:02 PM  Result Value Ref Range   Glucose-Capillary 156 (H) 70 - 99 mg/dL  Glucose, capillary     Status: Abnormal   Collection Time: 09/24/17  6:32 AM  Result Value Ref Range   Glucose-Capillary 158 (H) 70 - 99 mg/dL     HEENT: normal Cardio: RRR and no murmur Resp: CTA B/L and unlabored GI: BS positive and NT, ND Extremity:  Pulses positive and No Edema Skin:   Wound Ecchymosis but otherwise no drainage Neuro: Alert/Oriented, Abnormal Sensory absent motor and sensory below T2 and Abnormal Motor 5/5 BUE, 0/5 BLE, + babinski bilateral Musc/Skel:  Other no pain with UE AROM, no pain with LE PROM Gen NAD   Assessment/Plan: 1. Functional deficits secondary to T3 paraplegia which require 3+ hours per day of interdisciplinary therapy in a comprehensive inpatient rehab setting. Physiatrist is providing close team supervision and 24 hour management of active medical problems listed below. Physiatrist and rehab team continue to assess barriers to discharge/monitor patient progress toward functional and medical goals. FIM: Function - Bathing Position: Bed Body parts bathed by patient: Right arm, Left arm, Chest, Abdomen Body parts bathed by helper: Front perineal area, Buttocks, Right upper leg, Left upper leg, Right lower leg, Left lower leg, Back Assist Level: ((max A))  Function- Upper Body Dressing/Undressing What is the patient wearing?: Bra, Pull over shirt/dress Bra - Perfomed by patient: Thread/unthread right bra strap, Thread/unthread left bra strap Bra - Perfomed by  helper: Hook/unhook bra (pull down sports bra) Pull over shirt/dress - Perfomed by patient: Thread/unthread right sleeve, Thread/unthread left sleeve, Put head through opening Pull over shirt/dress -  Perfomed by helper: Pull shirt over trunk Assist Level: Touching or steadying assistance(Pt > 75%) Function - Lower Body Dressing/Undressing What is the patient wearing?: Pants, Ted Hose, Shoes Position: Bed Pants- Performed by helper: Thread/unthread right pants leg, Thread/unthread left pants leg, Pull pants up/down Shoes - Performed by helper: Don/doff right shoe, Don/doff left shoe, Fasten right, Fasten left TED Hose - Performed by helper: Don/doff right TED hose, Don/doff left TED hose Assist for footwear: Dependant Assist for lower body dressing: Touching or steadying assistance (Pt > 75%)  Function - Toileting Toileting steps completed by helper: Adjust clothing prior to toileting, Performs perineal hygiene, Adjust clothing after toileting(per Jobell Mesias, NT)  Function - Toilet Transfers Toilet transfer activity did not occur: Safety/medical concerns  Function - Chair/bed transfer Chair/bed transfer method: Lateral scoot Chair/bed transfer assist level: Moderate assist (Pt 50 - 74%/lift or lower) Chair/bed transfer assistive device: Sliding board Chair/bed transfer details: Manual facilitation for placement, Manual facilitation for weight shifting, Verbal cues for technique, Verbal cues for sequencing  Function - Locomotion: Wheelchair Will patient use wheelchair at discharge?: Yes Type: Manual Max wheelchair distance: 150 Assist Level: Supervision or verbal cues Assist Level: Supervision or verbal cues Assist Level: Supervision or verbal cues Turns around,maneuvers to table,bed, and toilet,negotiates 3% grade,maneuvers on rugs and over doorsills: Yes Function - Locomotion: Ambulation Ambulation activity did not occur: Safety/medical concerns Walk 10 feet activity did not  occur: Safety/medical concerns Walk 50 feet with 2 turns activity did not occur: Safety/medical concerns Walk 150 feet activity did not occur: Safety/medical concerns Walk 10 feet on uneven surfaces activity did not occur: Safety/medical concerns  Function - Comprehension Comprehension: Auditory Comprehension assist level: Understands complex 90% of the time/cues 10% of the time  Function - Expression Expression: Verbal Expression assist level: Expresses complex ideas: With extra time/assistive device  Function - Social Interaction Social Interaction assist level: Interacts appropriately with others with medication or extra time (anti-anxiety, antidepressant).  Function - Problem Solving Problem solving assist level: Solves complex 90% of the time/cues < 10% of the time  Function - Memory Memory assist level: Complete Independence: No helper Patient normally able to recall (first 3 days only): Current season, Location of own room, Staff names and faces, That he or she is in a hospital   Medical Problem List and Plan: 1.  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  DVT Prophylaxis/Anticoagulation/history of pulmonary emboli: Xarelto.  Monitor for any bleeding episodes 3. Pain Management: Baclofen 20 mg nightly, 4. Mood: Zoloft 25 mg daily.  Provide emotional support 5. Neuropsych: This patient is capable of making decisions on her own behalf. 6. Skin/Wound Care: Routine skin checks, surgical sites CDI 7. Fluids/Electrolytes/Nutrition: Routine in and outs with follow-up chemistries 8.  Left femoral shaft fracture.  Status post intramedullary retrograde nailing 09/11/2017.  Nonweightbearing. 9.  Acute blood loss anemia.  Follow-up CBC 7/2 , stable 10.  Hyperglycemia secondary to steroid.  SSI. CBG (last 3)  Recent Labs    09/23/17 1649  09/23/17 2202 09/24/17 0632  GLUCAP 116* 156* 158*  Controlled 7/4 11.  Neurogenic bowel/ bladder.  Established bowel program, ICP  Stop urecholine due to possible detrusor/sphinctrer dysynergia                           12.  Hypothyroidism.  Synthroid  13.  Mild thrombocytopenia r- monitor   LOS (Days) 3 A FACE TO FACE EVALUATION WAS PERFORMED  [name] 09/24/2017, 9:12 AM

## 2017-09-24 NOTE — Progress Notes (Signed)
Social Work Patient ID: Sarah [name], female   DOB: 05-15-1965, 52 y.o.   MRN: [id number]     Elease Hashimoto, LCSW  Social Worker  Physical Medicine and Rehabilitation  Progress Notes  Signed  Date of Service:  09/10/2017 8:26 AM       Related encounter: Admission (Discharged) from 09/08/2017 in [place]           Show:Clear all [x] Manual[x] Template[] Copied  Added by: [x] Dupree, Gardiner Rhyme, LCSW   [] Hover for details   Social Work  Social Work Assessment and Plan  Patient Details  Name: Sarah [name] MRN: [id number] Date of Birth: 09/06/1965  Today's Date: 09/10/2017  Problem List:      Patient Active Problem List   Diagnosis Date Noted  . Paraplegia ([place]) 09/08/2017  . Obesity (BMI 30-39.9) 09/03/2017  . Status post surgery 09/03/2017  . Calculus of gallbladder without cholecystitis without obstruction   . Biliary dyskinesia   . Intractable nausea and vomiting 08/09/2017  . Thrombocytopenia ([place]) 08/09/2017  . Elevated bilirubin 08/09/2017  . AKI (acute kidney injury) ([place]) 08/09/2017  . Nasal congestion 04/05/2017  . Breast pain 01/26/2017  . Upper respiratory infection, viral 01/26/2017  . Counseling regarding advanced care planning and goals of care 01/02/2017  . Bilateral rotator cuff syndrome 02/12/2016  . E. coli UTI 08/01/2015  . Constipation due to neurogenic bowel 08/01/2015  . Muscle spasticity   . 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 Spastic paraparesis   . Neurogenic bladder   . History of pulmonary embolism   . Post-operative pain   . Metastatic cancer ([place])   . Steroid-induced hyperglycemia   . Depression   . Obstipation   . Thoracic spine tumor 07/06/2015  . 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 ([place]) 07/06/2015  . Spinal cord compression due to malignant neoplasm metastatic to spine ([place])   . Postoperative hypothyroidism   . Secondary malignant neoplasm of vertebral column  ([place]) 03/08/2015  . Spasticity 09/19/2014  . Dysuria 09/04/2014  . Hypertension 05/01/2014  . Ingrown right big toenail 05/01/2014  . GERD (gastroesophageal reflux disease) 02/14/2014  . Dysphagia, pharyngoesophageal phase 02/06/2014  . Type 2 diabetes mellitus without complication ([place]) 28/41/3244  . Constipation   . Neurogenic bowel   . Paraplegia at T4 level ([place]) 01/20/2014  . Metastatic cancer to spine ([place]) 01/19/2014  . Rotator cuff tendonitis   . 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 Leg weakness, bilateral 12/13/2013   Past Medical History:      Past Medical History:  Diagnosis Date  . Cancer [facility])    FNA of thyroid positive for onconytic/hurthle cell carcinoma  . Chronic back pain   . DDD (degenerative disc disease), cervical   . DJD (degenerative joint disease)   . History of rectal fissure   . HIT (heparin-induced thrombocytopenia) ([place])   . Hypertension   . Obesity   . Paraplegia at T4 level ([place])   . Pulmonary embolus, right ([place]) 2015  . Rotator cuff tendonitis right   Past Surgical History:       Past Surgical History:  Procedure Laterality Date  . LAMINECTOMY N/A 12/14/2013   Procedure: THORACIC LAMINECTOMY FOR TUMOR THORACIC THREE;  Surgeon: Ashok Pall, MD;  Location: [place] NEURO ORS;  Service: Neurosurgery;  Laterality: N/A;  THORACIC LAMINECTOMY FOR TUMOR THORACIC THREE  . LAMINECTOMY N/A 07/05/2015   Procedure: THORACIC LAMINECTOMY  FOR TUMOR;  Surgeon: Ashok Pall, MD;  Location: [place] NEURO ORS;  Service: Neurosurgery;  Laterality: N/A;  . LAMINECTOMY N/A 09/03/2017   Procedure: POSTERIOR SPINAL TUMOR RESECTION THORACIC THREE;  Surgeon: Ashok Pall, MD;  Location: [facility];  Service: Neurosurgery;  Laterality: N/A;  POSTERIOR SPINAL TUMOR RESECTION THORACIC THREE  . POSTERIOR LUMBAR FUSION 4 LEVEL N/A 12/30/2013   Procedure: Thoracic one-Thoracic five posterior thoracic fusion  with pedicle screws;  Surgeon: Ashok Pall, MD;  Location: [place] NEURO ORS;  Service: Neurosurgery;  Laterality: N/A;  Thoracic one-Thoracic five posterior thoracic fusion with pedicle screws  . THYROIDECTOMY N/A 01/09/2014   Procedure: TOTAL THYROIDECTOMY;  Surgeon: Izora Gala, MD;  Location: [facility];  Service: ENT;  Laterality: N/A;  . TONSILLECTOMY     Social History:  reports that she has never smoked. She has never used smokeless tobacco. She reports that she does not drink alcohol or use drugs.  Family / Support Systems Marital Status: Single Patient Roles: Other (Comment)(sibling & daughter) Other Supports: Raelene Bott [phone]   [name] Hickman-sister [phone] Anticipated Caregiver: Sister, brother in-law and Mom Ability/Limitations of Caregiver: Sister works part time for the post office and Mom lives 4 minutes away Caregiver Availability: Evenings only Family Dynamics: Very close knit fmaily who has provided care to pt in the past and will again. They rely upon one another and will do whatever is needed for pt to assist her in her journey.  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 She wants to get back to her ambulation with a rolling walker, she was going to OP neuro prior to this  admission. She would like to return there when rehab is completed. Recent Psychosocial Issues: cancer she has been fighting for a while now Pyschiatric History: No history has benefited from seeing neuro-psych in the past and will have him see her again with this admission for coping and support. She is open to this and is familiar with Dr. Sima Matas Substance Abuse History: No issues  Patient / Family Perceptions, Expectations & Goals Pt/Family understanding of illness & functional limitations: Pt and sister have a good understanding of her surgery and treatment plan going forward. She tries to ask all of her questions and be as informed as possible about moving forward. She is future oriented and wants to be as independent as possible before leaving rehab. Premorbid pt/family roles/activities: Sister, daughter, friend, etc Anticipated changes in roles/activities/participation: resume Pt/family expectations/goals: Pt states: " I want to get back as much function as possible before going home, I would like to be mod/i if possible."  Sister states: " I hope she does will here she usually does."  [employer]: Other (Comment)(Active with [facility]-RN and has been to OP neuro) Premorbid Home Care/DME Agencies: Other (Comment)(has all needed equipment) Transportation available at discharge: Family Resource referrals recommended: Neuropsychology, Support group (specify)  Discharge Planning Living Arrangements: Other relatives Support Systems:  Parent, Other relatives, Friends/neighbors Type of Residence: Private residence Insurance Resources: Multimedia programmer (specify)(UHC-Medicare) Financial Resources: SSD, Family Support Financial Screen Referred: No Living Expenses: Lives with family Money Management: Patient, Family Does the patient have any problems obtaining your medications?: No Home Management: family members Patient/Family Preliminary Plans: Return home with  sister and brother in-law with Mom assisitng if needed. She is very familiar with the rehab program has been here on two other occassions. Will await team evaluations and work on plans. Had medical issues yesterday and could not leave the room so hopefully will be able to today. Social Work Anticipated Follow Up Needs: HH/OP, Support Group  Clinical Impression Pleasant female who is very familiar with the rehab program and is glad to be back, knowing she will get intensive therapies which she needs. Will work with on discharge plans and have neuro-psych see for coping.  Elease Hashimoto 09/10/2017, 8:26 AM             No changes needed to original assessment.  Yanilen Adamik, LCSW

## 2017-09-24 NOTE — Progress Notes (Signed)
Physical Therapy Session Note  Patient Details  Name: Sarah [name] MRN: [id number] Date of Birth: September 21, 1965  Today's Date: 09/24/2017 PT Individual Time: 1400-1500 PT Individual Time Calculation (min): 60 min   Short Term Goals: Week 1:  PT Short Term Goal 1 (Week 1): Pt will perform bed mobility with min A  PT Short Term Goal 2 (Week 1): Pt will be able to position slide board and complete transfer with min A  PT Short Term Goal 3 (Week 1): Pt will propel w/c with SPV for >500 ft  PT Short Term Goal 4 (Week 1): Pt will perform car transfer with mod A +1:     Skilled Therapeutic Interventions/Progress Updates:  Tx focused on transfer trianing, sitting balance, WC propulsion in community setting, and bil LE stretching. Pt up in [facility], ready to go. No pain. Pt propelled WC 2x150' in controlled setting and 1x150 on outdoor surfaces, including includes and tight obstacle navigation. 100' in store, managing multi-directions and tight turns all with S and increased time Sliding board transfers WC<>mat with Mod A overall and increased time for set-up, board placement, and LE management. Pt able to direct self-cares 50% and needed cues for effective scoot, but good technique. Static/dynamic sitting balance edge of mat with hands on legs>>hands tossing ball gently with UEs to recover x8 min.  Seated passivestretching at hip rotators, HS and DF each 2x36min bil.  Pt left up in [facility] with all needs in reach. No pain     Therapy Documentation Precautions:  Precautions Precautions: Fall Precaution Comments: throracic region, LEs spasicity  Restrictions Weight Bearing Restrictions: Yes LLE Weight Bearing: Non weight bearing Other Position/Activity Restrictions: LLE NWB   See Function Navigator for Current Functional Status.   Therapy/Group: Individual Therapy  Kennieth Rad, PT, DPT   Dwaine Deter, Suleima Ohlendorf M 09/24/2017, 3:15 PM

## 2017-09-24 NOTE — Plan of Care (Signed)
  Problem: Consults Goal: RH SPINAL CORD INJURY PATIENT EDUCATION Description  See Patient Education module for education specifics.  Outcome: Progressing Goal: Skin Care Protocol Initiated - if Braden Score 18 or less Description If consults are not indicated, leave blank or document N/A Outcome: Progressing   Problem: SCI BOWEL ELIMINATION Goal: RH STG MANAGE BOWEL WITH ASSISTANCE Description STG Manage Bowel with mod Assistance.  Outcome: Progressing Goal: RH STG SCI MANAGE BOWEL WITH MEDICATION WITH ASSISTANCE Description STG SCI Manage bowel with medication with mod assistance.  Outcome: Progressing Goal: RH STG MANAGE BOWEL W/EQUIPMENT W/ASSISTANCE Description STG Manage Bowel With Equipment With mod Assistance  Outcome: Progressing Goal: RH STG SCI MANAGE BOWEL PROGRAM W/ASSIST OR AS APPROPRIATE Description STG SCI Manage bowel program w/assist mod   Outcome: Progressing   Problem: RH SKIN INTEGRITY Goal: RH STG SKIN FREE OF INFECTION/BREAKDOWN Description Skin wound healing and no new skin breakdown with min assistance  Outcome: Progressing Goal: RH STG ABLE TO PERFORM INCISION/WOUND CARE W/ASSISTANCE Description STG Able To Perform Incision/Wound Care With mod Assistance.  Outcome: Progressing   Problem: RH SAFETY Goal: RH STG ADHERE TO SAFETY PRECAUTIONS W/ASSISTANCE/DEVICE Description STG Adhere to Safety Precautions With min Assistance/Device.  Outcome: Progressing   Problem: RH PAIN MANAGEMENT Goal: RH STG PAIN MANAGED AT OR BELOW PT'S PAIN GOAL Description Pain < or = 4 with min assistance  Outcome: Progressing   Problem: RH KNOWLEDGE DEFICIT SCI Goal: RH STG INCREASE KNOWLEDGE OF SELF CARE AFTER SCI Description Patient able to verbalize safe care activities with min assistance   Outcome: Progressing   Problem: SCI BLADDER ELIMINATION Goal: RH STG MANAGE BLADDER WITH ASSISTANCE Description STG Manage Bladder With mod Assistance  Outcome: Not  Progressing   Problem: RH SKIN INTEGRITY Goal: RH STG MAINTAIN SKIN INTEGRITY WITH ASSISTANCE Description STG Maintain Skin Integrity With min Assistance.  Outcome: Not Progressing   Pt remains incontinent with PVRs Q4H low volumes  K Derrill Bagnell, LPN

## 2017-09-24 NOTE — IPOC Note (Signed)
Overall Plan of Care [facility]) Patient Details Name: Sarah [name] MRN: [id number] DOB: 11/15/1965  Admitting Diagnosis: <principal problem not specified>  Hospital Problems: Active Problems:   Myelopathy ([place])     Functional Problem List: Nursing Bladder, Bowel, Endurance, Medication Management, Motor, Pain, Safety, Skin Integrity  PT Balance, Sensory, Endurance, Motor, Pain  OT Balance, Edema, Safety, Endurance, Motor, Skin Integrity, Sensory  SLP    TR         Basic ADL's: OT Toileting, Dressing, Bathing, Grooming, Eating     Advanced  ADL's: OT       Transfers: PT Bed Mobility, Bed to Chair, Car, [employer], Floor  OT Toilet     Locomotion: PT Wheelchair Mobility     Additional Impairments: OT None  SLP        TR      Anticipated Outcomes Item Anticipated Outcome  Self Feeding n/a  Swallowing      Basic self-care  setup   Toileting  setup    Bathroom Transfers contact guard  Bowel/Bladder  managed bowel and bladder max assit toileting program suppository every pm   Transfers  CGA for slide board transfers   Locomotion  Mod I with w/c propulstion in community and home environment   Communication     Cognition     Pain  less than 3   Safety/Judgment  mod I    Therapy Plan: PT Intensity: Minimum of 1-2 x/day ,45 to 90 minutes PT Frequency: 5 out of 7 days PT Duration Estimated Length of Stay: 18-21 OT Intensity: Minimum of 1-2 x/day, 45 to 90 minutes OT Frequency: 5 out of 7 days OT Duration/Estimated Length of Stay: 2-2 1/2 weeks      Team Interventions: Nursing Interventions Patient/Family Education, Bladder Management, Bowel Management, Disease Management/Prevention, Pain Management, Medication Management, Skin Care/Wound Management  PT interventions Discharge planning, Functional mobility training, Psychosocial support, Therapeutic Activities, Balance/vestibular training, Neuromuscular re-education, Therapeutic Exercise, Wheelchair  propulsion/positioning, DME/adaptive equipment instruction, Pain management, UE/LE Strength taining/ROM, Community reintegration, Technical sales engineer stimulation, Barrister's clerk education, UE/LE Coordination activities  OT Interventions Training and development officer, Community reintegration, Disease mangement/prevention, Neuromuscular re-education, Barrister's clerk education, Self Care/advanced ADL retraining, Splinting/orthotics, Therapeutic Exercise, UE/LE Coordination activities, Wheelchair propulsion/positioning, Discharge planning, DME/adaptive equipment instruction, Pain management, Functional mobility training, Psychosocial support, Skin care/wound managment, Therapeutic Activities, UE/LE Strength taining/ROM  SLP Interventions    TR Interventions    SW/CM Interventions Discharge Planning, Psychosocial Support, Patient/Family Education   Barriers to Discharge MD  Medical stability, Weight bearing restrictions and Pending chemo/radiation  Nursing      PT Decreased caregiver support, Weight bearing restrictions Pt does not have 24/7 support at home  OT Pending chemo/radiation    SLP      SW       Team Discharge Planning: Destination: PT-Home ,OT- Home , SLP-  Projected Follow-up: PT-Home health PT, OT-  Home health OT, SLP-  Projected Equipment Needs: PT-Wheelchair cushion (measurements), Wheelchair (measurements), OT- To be determined, SLP-  Equipment Details: PT- , OT-  Patient/family involved in discharge planning: PT- Patient,  OT-Patient, Family member/caregiver, SLP-   MD ELOS: 18-21d Medical Rehab Prognosis:  Fair Assessment:  52 year old right-handed female with history of hypertension, pulmonary emboli maintained on Xarelto, 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  Per chart review she lives with sister and brother-in-law.  One level home with ramped entrance.  Up until recently she was using a sliding board for transfers and ambulating short  distances with a rolling walker up until May 2019.  She had been using a wheelchair most the time for the past few years otherwise.  She has had 2 previous back surgeries T3 in 2015 and 2017 he did receive inpatient rehab services.  Presented 09/02/2017 with recent fall increasing leg weakness over the past 2 weeks.  She was to see her oncologist 08/24/2017 for follow-up MRI that was planned however could not make the appointment due to lower extremity weakness.  She did also have some urinary incontinence with noted neurogenic bladder.  MRI and imaging revealed tumor recurrence at T3 with severe spinal cord compression.  Underwent posterior spinal tumor resection T3 09/03/2017 per Dr. Christella Noa.  Radiation oncology follow-up Dr. Dara Lords to follow-up outpatient for plan radiation therapy.  Decadron protocol was indicated.  Hospital course anemia and monitored.  E. coli UTI completed a course of Rocephin.  Physical and occupational therapy evaluations completed and patient was admitted for a comprehensive rehab program 09/08/2017.  Follow-up labs on admission to rehab services showed hemoglobin 6.2 that was confirmed and patient was transfused.  Patient's initial plan was to resume Xarelto for history of pulmonary emboli but held due to anemia.  CT of lumbar and thoracic spine as well as pelvis were unremarkable.  Hemoglobin rebounded 8.7.  Patient with ongoing complaints of left knee pain and some effusion.  X-rays and imaging revealed left femoral shaft fracture likely felt to be subacute with noted history of recent fall.  Orthopedic services consulted she was discharged to acute care services and underwent intramedullary retrograde femoral nailing 09/11/2017 per Dr. Marcelino Scot.  Nonweightbearing left lower extremity.  Her Xarelto was resumed postoperatively.  Close monitoring of hemoglobin and hematocrit.  Physical and Occupational Therapy evaluations completed postoperatively 6/22 and patient was admitted to inpatient rehab  services 09/15/2017.  Upon admission to inpatient rehab services patient with intractable nausea vomiting noted bouts of constipation.  She was again discharged back to acute care services.  Abdominal film showed nonobstructive bowel gas pattern.  Moderate colorectal stool volume indicating constipation.  Ultrasound the abdomen showed cholelithiasis without complicating factors.  Bowel program was established.  Nausea vomiting greatly improved   Now requiring 24/7 Rehab RN,MD, as well as CIR level PT, OT and SLP.  Treatment team will focus on ADLs and mobility with goals set at [facility] level  See Team Conference Notes for weekly updates to the plan of care

## 2017-09-25 ENCOUNTER — Encounter: Payer: Self-pay | Admitting: *Deleted

## 2017-09-25 ENCOUNTER — Inpatient Hospital Stay (HOSPITAL_COMMUNITY): Payer: Medicare Other | Admitting: Physical Therapy

## 2017-09-25 ENCOUNTER — Other Ambulatory Visit: Payer: Medicare Other

## 2017-09-25 ENCOUNTER — Inpatient Hospital Stay (HOSPITAL_COMMUNITY): Payer: Medicare Other | Admitting: Occupational Therapy

## 2017-09-25 ENCOUNTER — Ambulatory Visit: Payer: Medicare Other | Admitting: Hematology

## 2017-09-25 LAB — GLUCOSE, CAPILLARY
GLUCOSE-CAPILLARY: 151 mg/dL — AB (ref 70–99)
GLUCOSE-CAPILLARY: 155 mg/dL — AB (ref 70–99)
Glucose-Capillary: 120 mg/dL — ABNORMAL HIGH (ref 70–99)
Glucose-Capillary: 154 mg/dL — ABNORMAL HIGH (ref 70–99)

## 2017-09-25 MED ORDER — NYSTATIN 100000 UNIT/ML MT SUSP
5.0000 mL | Freq: Four times a day (QID) | OROMUCOSAL | Status: DC
  Administered 2017-09-25 – 2017-10-02 (×26): 500000 [IU] via OROMUCOSAL
  Filled 2017-09-25 (×27): qty 5

## 2017-09-25 MED ORDER — DEXAMETHASONE 4 MG PO TABS
4.0000 mg | ORAL_TABLET | Freq: Two times a day (BID) | ORAL | Status: DC
  Administered 2017-09-25 – 2017-09-28 (×6): 4 mg via ORAL
  Filled 2017-09-25 (×6): qty 1

## 2017-09-25 NOTE — Progress Notes (Signed)
Subjective/Complaints:  Problem swallowing more solid food such as sandwich , no facial weakness noted pt asking if this is a decadron side effect  ROS- denies CP, SOB, N/V/D Objective: Vital Signs: Blood pressure 113/71, pulse 74, temperature 97.8 F (36.6 C), temperature source Oral, resp. rate 18, height 5\' 11"  (1.803 m), weight 103.4 kg (227 lb 15.3 oz), last menstrual period 10/22/2016, SpO2 100 %. No results found. Results for orders placed or performed during the hospital encounter of 09/21/17 (from the past 72 hour(s))  Glucose, capillary     Status: Abnormal   Collection Time: 09/22/17 12:32 PM  Result Value Ref Range   Glucose-Capillary 139 (H) 70 - 99 mg/dL  Glucose, capillary     Status: Abnormal   Collection Time: 09/22/17  4:36 PM  Result Value Ref Range   Glucose-Capillary 125 (H) 70 - 99 mg/dL  Glucose, capillary     Status: Abnormal   Collection Time: 09/22/17  9:26 PM  Result Value Ref Range   Glucose-Capillary 128 (H) 70 - 99 mg/dL  Glucose, capillary     Status: Abnormal   Collection Time: 09/23/17  6:16 AM  Result Value Ref Range   Glucose-Capillary 107 (H) 70 - 99 mg/dL  Glucose, capillary     Status: Abnormal   Collection Time: 09/23/17 12:01 PM  Result Value Ref Range   Glucose-Capillary 134 (H) 70 - 99 mg/dL   Comment 1 Notify RN   Glucose, capillary     Status: Abnormal   Collection Time: 09/23/17  4:49 PM  Result Value Ref Range   Glucose-Capillary 116 (H) 70 - 99 mg/dL   Comment 1 Notify RN   Glucose, capillary     Status: Abnormal   Collection Time: 09/23/17 10:02 PM  Result Value Ref Range   Glucose-Capillary 156 (H) 70 - 99 mg/dL  Glucose, capillary     Status: Abnormal   Collection Time: 09/24/17  6:32 AM  Result Value Ref Range   Glucose-Capillary 158 (H) 70 - 99 mg/dL  Glucose, capillary     Status: Abnormal   Collection Time: 09/24/17 11:47 AM  Result Value Ref Range   Glucose-Capillary 134 (H) 70 - 99 mg/dL  Glucose, capillary      Status: Abnormal   Collection Time: 09/24/17  4:42 PM  Result Value Ref Range   Glucose-Capillary 199 (H) 70 - 99 mg/dL  Glucose, capillary     Status: Abnormal   Collection Time: 09/24/17  5:34 PM  Result Value Ref Range   Glucose-Capillary 152 (H) 70 - 99 mg/dL  Glucose, capillary     Status: Abnormal   Collection Time: 09/24/17  9:16 PM  Result Value Ref Range   Glucose-Capillary 177 (H) 70 - 99 mg/dL  Glucose, capillary     Status: Abnormal   Collection Time: 09/25/17  6:41 AM  Result Value Ref Range   Glucose-Capillary 120 (H) 70 - 99 mg/dL     HEENT: white plaque on uvula Cardio: RRR and no murmur Resp: CTA B/L and unlabored GI: BS positive and NT, ND Extremity:  Pulses positive and No Edema Skin:   Wound Ecchymosis but otherwise no drainage Neuro: Alert/Oriented, Abnormal Sensory absent motor and sensory below T2 and Abnormal Motor 5/5 BUE, 0/5 BLE, + babinski bilateral Musc/Skel:  Other no pain with UE AROM, no pain with LE PROM Gen NAD   Assessment/Plan: 1. Functional deficits secondary to T3 paraplegia which require 3+ hours per day of interdisciplinary therapy in a  comprehensive inpatient rehab setting. Physiatrist is providing close team supervision and 24 hour management of active medical problems listed below. Physiatrist and rehab team continue to assess barriers to discharge/monitor patient progress toward functional and medical goals. FIM: Function - Bathing Position: Bed Body parts bathed by patient: Right arm, Left arm, Chest, Abdomen, Right upper leg, Left upper leg Body parts bathed by helper: Front perineal area, Buttocks, Right lower leg, Left lower leg, Back Assist Level: (mod A)  Function- Upper Body Dressing/Undressing What is the patient wearing?: Bra, Pull over shirt/dress Bra - Perfomed by patient: Thread/unthread right bra strap, Thread/unthread left bra strap Bra - Perfomed by helper: Hook/unhook bra (pull down sports bra) Pull over  shirt/dress - Perfomed by patient: Thread/unthread right sleeve, Thread/unthread left sleeve, Put head through opening Pull over shirt/dress - Perfomed by helper: Pull shirt over trunk Assist Level: Touching or steadying assistance(Pt > 75%) Function - Lower Body Dressing/Undressing What is the patient wearing?: Pants, Ted Hose, Shoes Position: Bed Pants- Performed by patient: Pull pants up/down Pants- Performed by helper: Thread/unthread right pants leg, Thread/unthread left pants leg Shoes - Performed by helper: Don/doff right shoe, Don/doff left shoe, Fasten right, Fasten left TED Hose - Performed by helper: Don/doff right TED hose, Don/doff left TED hose Assist for footwear: Dependant Assist for lower body dressing: (max A)  Function - Toileting Toileting steps completed by helper: Adjust clothing prior to toileting, Performs perineal hygiene, Adjust clothing after toileting(per Jobell Mesias, NT)  Function - Toilet Transfers Toilet transfer activity did not occur: Safety/medical concerns  Function - Chair/bed transfer Chair/bed transfer method: Lateral scoot Chair/bed transfer assist level: Moderate assist (Pt 50 - 74%/lift or lower) Chair/bed transfer assistive device: Sliding board Chair/bed transfer details: Manual facilitation for placement, Manual facilitation for weight shifting, Verbal cues for technique, Verbal cues for sequencing, Tactile cues for weight shifting, Tactile cues for placement, Tactile cues for sequencing  Function - Locomotion: Wheelchair Will patient use wheelchair at discharge?: Yes Type: Manual Max wheelchair distance: 150 Assist Level: Supervision or verbal cues Wheel 50 feet with 2 turns activity did not occur: Safety/medical concerns Assist Level: Supervision or verbal cues Wheel 150 feet activity did not occur: Safety/medical concerns Assist Level: Supervision or verbal cues Turns around,maneuvers to table,bed, and toilet,negotiates 3%  grade,maneuvers on rugs and over doorsills: Yes Function - Locomotion: Ambulation Ambulation activity did not occur: Safety/medical concerns Walk 10 feet activity did not occur: Safety/medical concerns Walk 50 feet with 2 turns activity did not occur: Safety/medical concerns Walk 150 feet activity did not occur: Safety/medical concerns Walk 10 feet on uneven surfaces activity did not occur: Safety/medical concerns  Function - Comprehension Comprehension: Auditory Comprehension assist level: Understands complex 90% of the time/cues 10% of the time  Function - Expression Expression: Verbal Expression assist level: Expresses complex ideas: With extra time/assistive device  Function - Social Interaction Social Interaction assist level: Interacts appropriately with others with medication or extra time (anti-anxiety, antidepressant).  Function - Problem Solving Problem solving assist level: Solves complex 90% of the time/cues < 10% of the time  Function - Memory Memory assist level: Complete Independence: No helper Patient normally able to recall (first 3 days only): Current season, Location of own room, Staff names and faces, That he or she is in a hospital   Medical Problem List and Plan: 1.  Myelopathy and paraplegia secondary to metastatic Hurthle cancer to the thoracic spine s/p recurrence at T3 spinal tumor with resection 09/03/2017.SCI appears complete Cont CIR,  PT,  OT 2.  DVT Prophylaxis/Anticoagulation/history of pulmonary emboli: Xarelto.  Monitor for any bleeding episodes 3. Pain Management: Baclofen 20 mg nightly,no pain c/os 7/5 4. Mood: Zoloft 25 mg daily.  Provide emotional support 5. Neuropsych: This patient is capable of making decisions on her own behalf. 6. Skin/Wound Care: Routine skin checks, surgical sites CDI 7. Fluids/Electrolytes/Nutrition: Routine in and outs with follow-up chemistries 8.  Left femoral shaft fracture.  Status post intramedullary retrograde  nailing 09/11/2017.  Nonweightbearing. 9.  Acute blood loss anemia.  Follow-up CBC 7/2 , stable 10.  Hyperglycemia secondary to steroid.  SSI.will wean dose CBG (last 3)  Recent Labs    09/24/17 1734 09/24/17 2116 09/25/17 0641  GLUCAP 152* 177* 120*  Controlled 7/5 11.  Neurogenic bowel/ bladder.  Established bowel program, ICP                     12.  Hypothyroidism.  Synthroid 13.  Mild thrombocytopenia r- monitor 14.  Dysphagia due to thrush, start Nystatin  LOS (Days) 4 A FACE TO FACE EVALUATION WAS PERFORMED  [name] 09/25/2017, 7:54 AM

## 2017-09-25 NOTE — Progress Notes (Signed)
Physical Therapy Session Note  Patient Details  Name: Sarah [name] MRN: [id number] Date of Birth: [birth year]  Today's Date: 09/25/2017 PT Individual Time: 0930-1030 & 1300-1330 PT Individual Time Calculation (min): 60 min &30 min   Short Term Goals: Week 1:  PT Short Term Goal 1 (Week 1): Pt will perform bed mobility with min A  PT Short Term Goal 2 (Week 1): Pt will be able to position slide board and complete transfer with min A  PT Short Term Goal 3 (Week 1): Pt will propel w/c with SPV for >500 ft  PT Short Term Goal 4 (Week 1): Pt will perform car transfer with mod A +1  Skilled Therapeutic Interventions/Progress Updates:   Session 1:  Pt reported no pain prior to start of her treatment session this morning. Pt performed x8 min of UE ergometer in posterior direction focusing on scapular retraction and pectoralis stretch with goal of improving posture. Pt tolerated UE exercise well on level 1. PT initiated shoulder ROM exercise and reaching outside BOS with yellow exercise ball with Pt seated rolling ball onto mat until limits of stability is reached with recovery back to BOS for 1x15 reps. PT further progressed Pt so rolling ball across midline functionally reaching forward and laterally for 2x15 reps. PT initiated dynamic sitting balance with ball toss incorporating lateral functional reaching for inc challenge in sitting. Pt was able to maintain sitting balance with functional reaching requiring steadying fading to min A for recovery from posterior lean with fatigue onset. Activity was performed for ~8 min with frequent rest breaks and inc time to regain sitting balance. Pt performed lateral scoot transfers with slide board consistently with mod A using 4 inch block under LE's for support. Pt self propelled w/c 150 feet back to room. Pt was returned sitting with call bell and tray table in reach and all needs met.   Session 2: Pt reported no pain prior to treatment session and appeared to be  in better spirits this afternoon compared to yesterdays afternoon session. PT initiated training with w/c propulsion for >500 feet working on [employer] distracting environments and negotiation obstacles/ turning. Pt responded well to her therapy session and did not require any rest breaks indicating improvement with endurance and UE strength. Pt was returned to her room with tray table and call bell in reach and all needs met.   Therapy Documentation Precautions:  Precautions Precautions: Fall Precaution Comments: throracic region, LEs spasicity  Restrictions Weight Bearing Restrictions: Yes LLE Weight Bearing: Non weight bearing Other Position/Activity Restrictions: LLE NWB Other Treatments:     See Function Navigator for Current Functional Status.   Therapy/Group: Individual Therapy  Floreen Comber 09/25/2017, 4:17 PM

## 2017-09-25 NOTE — Progress Notes (Signed)
Occupational Therapy Session Note  Patient Details  Name: Sarah [name] MRN: [id number] Date of Birth: October 23, 1965  Today's Date: 09/25/2017 OT Individual Time: 0240-9735 and 1435-1440 OT Individual Time Calculation (min): 72 min and 65 min  Short Term Goals: Week 1:  OT Short Term Goal 1 (Week 1): Pt will transfer to padded tub bench for toileting with mod A  OT Short Term Goal 2 (Week 1): Pt will maintain static sitting balance with one UE support with supervision  OT Short Term Goal 3 (Week 1): Pt will maintain dynamic sitting balance in prep for ADL with min A   Skilled Therapeutic Interventions/Progress Updates:    Pt greeted supine in bed, requesting to bathe. Pt completed bathing/dressing bedlevel with HOB elevated during session. Worked on UB strengthening by having pt pull forward with bedrails when OT assisted with bra and pulling down shirt. Initiated leg lifter training, with having pt place each LE into figure 4 for OT to assist with washing LEs and donning pants. She rolled Rt>Lt with Mod A for LE placement during perihygiene completion and when pulling pants over hips. Thigh high Teds donned Total A. Afterwards supine<sit completed with Mod A, and slideboard transfer<w/c completed with Mod A also with min vcs for technique. Once in w/c, she completed oral care/grooming tasks with setup. Pt left with RN at session exit to receive pain medicine.   2nd Session 1:1 tx (65 min) Pt greeted supine in bed, asleep, easily woken. Agreeable to go outside. RN in to provide pain medicine first. Supine<sit and slideboard to Rt completed with Mod A. She was then escorted to outdoor setting to improve psychosocial health. Pt was guided through gentle yoga-based back stretches to lessen discomfort and improve mobility of spine. Dynamic balance challenges with lateral bends. Afterwards pt completed bilateral UE stretches/therex with use of orange tband x10 reps with instruction on technique. At end of  session pt was escorted back to room and left in w/c with all needs within reach.    Therapy Documentation Precautions:  Precautions Precautions: Fall Precaution Comments: throracic region, LEs spasicity  Restrictions Weight Bearing Restrictions: Yes LLE Weight Bearing: Non weight bearing Other Position/Activity Restrictions: LLE NWB Pain: Pain Assessment Pain Scale: 0-10 Pain Score: 5  Pain Location: Back Pain Orientation: Upper Pain Descriptors / Indicators: Aching Pain Frequency: Intermittent Patients Stated Pain Goal: 2 Pain Intervention(s): Medication (See eMAR) ADL: ADL ADL Comments: see functional navigator     See Function Navigator for Current Functional Status.   Therapy/Group: Individual Therapy  Brayant Dorr A Ralpheal Zappone 09/25/2017, 9:15 AM

## 2017-09-25 NOTE — Significant Event (Signed)
1200 insulin refused, states will bottom out glucose, did not like lunch, carb snack given

## 2017-09-25 NOTE — Care Management (Signed)
[facility] Individual Statement of Services  Patient Name:  Sarah [name]  Date:  09/25/2017  Welcome to the [facility].  Our goal is to provide you with an individualized program based on your diagnosis and situation, designed to meet your specific needs.  With this comprehensive rehabilitation program, you will be expected to participate in at least 3 hours of rehabilitation therapies Monday-Friday, with modified therapy programming on the weekends.  Your rehabilitation program will include the following services:  Physical Therapy (PT), Occupational Therapy (OT), 24 hour per day rehabilitation nursing, Therapeutic Recreaction (TR), Neuropsychology, Case Management (Social Worker), Rehabilitation Medicine, Nutrition Services and Pharmacy Services  Weekly team conferences will be held on Tuesdays to discuss your progress.  Your Social Worker will talk with you frequently to get your input and to update you on team discussions.  Team conferences with you and your family in attendance may also be held.  Expected length of stay: 2-3 weeks    Overall anticipated outcome: supervision @ wheelchair level  Depending on your progress and recovery, your program may change. Your Social Worker will coordinate services and will keep you informed of any changes. Your Social Worker's name and contact numbers are listed  below.  The following services may also be recommended but are not provided by the [facility] will be made to provide these services after discharge if needed.  Arrangements include referral to agencies that provide these services.  Your insurance has been verified to be:  [facility] Medicare Your primary doctor is:  Dr. Andy Gauss  Pertinent information will be shared with your doctor and your  insurance company.  Social Worker:  [place], [place] or ([phone]   Information discussed with and copy given to patient by: Lennart Pall, 09/25/2017, 4:04 PM

## 2017-09-25 NOTE — Progress Notes (Signed)
Social Work Patient ID: Sarah [name], female   DOB: Mar 14, 1966, 52 y.o.   MRN: [id number]  Have reviewed team conference with pt who is aware and agreeable with ELOS of 2-3 weeks and supervision goals overall.  Pt very concerned about needing to reach a safe level of function in which she could be alone for a few hours per day.  She has questions about onc f/u and w/c needs.  Will follow up with appropriate persons about these concerns.  Will continue to follow for d/c planning and support needs.  Vir Whetstine, LCSW

## 2017-09-26 ENCOUNTER — Inpatient Hospital Stay (HOSPITAL_COMMUNITY): Payer: Medicare Other | Admitting: Occupational Therapy

## 2017-09-26 ENCOUNTER — Inpatient Hospital Stay (HOSPITAL_COMMUNITY): Payer: Medicare Other

## 2017-09-26 ENCOUNTER — Inpatient Hospital Stay (HOSPITAL_COMMUNITY): Payer: Medicare Other | Admitting: Physical Therapy

## 2017-09-26 DIAGNOSIS — E099 Drug or chemical induced diabetes mellitus without complications: Secondary | ICD-10-CM

## 2017-09-26 DIAGNOSIS — D696 Thrombocytopenia, unspecified: Secondary | ICD-10-CM

## 2017-09-26 DIAGNOSIS — T380X5A Adverse effect of glucocorticoids and synthetic analogues, initial encounter: Secondary | ICD-10-CM

## 2017-09-26 LAB — GLUCOSE, CAPILLARY
GLUCOSE-CAPILLARY: 107 mg/dL — AB (ref 70–99)
GLUCOSE-CAPILLARY: 126 mg/dL — AB (ref 70–99)
GLUCOSE-CAPILLARY: 143 mg/dL — AB (ref 70–99)
GLUCOSE-CAPILLARY: 159 mg/dL — AB (ref 70–99)

## 2017-09-26 MED ORDER — SENNA 8.6 MG PO TABS
2.0000 | ORAL_TABLET | Freq: Every day | ORAL | Status: DC
  Administered 2017-09-27 – 2017-09-29 (×3): 17.2 mg via ORAL
  Filled 2017-09-26 (×3): qty 2

## 2017-09-26 NOTE — Progress Notes (Signed)
Sarah [name] is a 52 y.o. female 02/01/1966 [id number]  Subjective: No new complaints. No new problems. Slept well. Feeling OK.  She is wondering if some of her evening meds could be moved to another time of the day  Objective: Vital signs in last 24 hours: Temp:  [97.3 F (36.3 C)-98.5 F (36.9 C)] 97.9 F (36.6 C) (07/06 0347) Pulse Rate:  [69-79] 69 (07/06 0347) Resp:  [16-18] 18 (07/06 0347) BP: (115-128)/(76-81) 127/76 (07/06 0347) SpO2:  [100 %] 100 % (07/06 0347) Weight change:  Last BM Date: 09/26/17  Intake/Output from previous day: 07/05 0701 - 07/06 0700 In: 1080 [P.O.:1080] Out: -  Last cbgs: CBG (last 3)  Recent Labs    09/25/17 1631 09/25/17 2113 09/26/17 0645  GLUCAP 151* 155* 107*     Physical Exam General: No apparent distress eating breakfast.  Obese HEENT: not dry Lungs: Normal effort. Lungs clear to auscultation, no crackles or wheezes. Cardiovascular: Regular rate and rhythm, no edema Abdomen: S/NT/ND; BS(+) Musculoskeletal:  unchanged Neurological: No new neurological deficits Wounds: N/A    Skin: clear  Aging changes Mental state: Alert, oriented, cooperative    Lab Results: BMET    Component Value Date/Time   NA 135 09/22/2017 0625   NA 145 (H) 08/21/2017 1221   NA 143 01/02/2017 1002   K 4.7 09/22/2017 0625   K 4.0 01/02/2017 1002   CL 100 09/22/2017 0625   CO2 29 09/22/2017 0625   CO2 25 01/02/2017 1002   GLUCOSE 97 09/22/2017 0625   GLUCOSE 87 01/02/2017 1002   BUN 17 09/22/2017 0625   BUN 7 08/21/2017 1221   BUN 11.9 01/02/2017 1002   CREATININE 0.86 09/22/2017 0625   CREATININE 1.00 08/24/2017 1250   CREATININE 0.9 01/02/2017 1002   CALCIUM 8.0 (L) 09/22/2017 0625   CALCIUM 7.2 (L) 09/15/2017 0635   CALCIUM 8.8 01/02/2017 1002   GFRNONAA >60 09/22/2017 0625   GFRNONAA >60 08/24/2017 1250   GFRNONAA 71 06/07/2015 1523   GFRAA >60 09/22/2017 0625   GFRAA >60 08/24/2017 1250   GFRAA 82 06/07/2015 1523   CBC     Component Value Date/Time   WBC 4.7 09/22/2017 0625   RBC 2.77 (L) 09/22/2017 0625   HGB 8.6 (L) 09/22/2017 0625   HGB 10.8 (L) 08/24/2017 1250   HGB 10.7 (L) 08/21/2017 1016   HGB 12.7 01/02/2017 1002   HCT 26.8 (L) 09/22/2017 0625   HCT 33.0 (L) 08/21/2017 1016   HCT 37.4 01/02/2017 1002   PLT 143 (L) 09/22/2017 0625   PLT 320 08/24/2017 1250   PLT 328 08/21/2017 1016   MCV 96.8 09/22/2017 0625   MCV 90 08/21/2017 1016   MCV 87.0 01/02/2017 1002   MCH 31.0 09/22/2017 0625   MCHC 32.1 09/22/2017 0625   RDW 19.3 (H) 09/22/2017 0625   RDW 16.3 (H) 08/21/2017 1016   RDW 14.6 (H) 01/02/2017 1002   LYMPHSABS 0.9 09/22/2017 0625   LYMPHSABS 1.0 01/02/2017 1002   MONOABS 0.2 09/22/2017 0625   MONOABS 0.2 01/02/2017 1002   EOSABS 0.0 09/22/2017 0625   EOSABS 0.1 01/02/2017 1002   BASOSABS 0.0 09/22/2017 0625   BASOSABS 0.0 01/02/2017 1002    Studies/Results: No results found.  Medications: I have reviewed the patient's current medications.  Assessment/Plan:     1.  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  Status post recurrence and a T3 spinal tumor with resection on 09/03/2017.  S CI  appears complete. CIR 2.  DVT prophylaxis with Xarelto 3.  Pain management with baclofen 4.  Depression.  On Zoloft 5.  Left femoral shaft fracture.  Status post intramedullary nailing on 09/11/2017 6.  Hyperglycemia on steroids.  Continue with a sliding scale Neurogenic bowel and bladder.  On bowel and bladder  program 7.  Hypothyroidism on Synthroid 8.  Thrombocytopenia-monitoring blood counts 9.  Thrush-on therapy       Length of stay, days: 5  Walker Kehr , MD 09/26/2017, 11:42 AM

## 2017-09-26 NOTE — Progress Notes (Signed)
Physical Therapy Session Note  Patient Details  Name: Sarah [name] MRN: [id number] Date of Birth: March 01, 1966  Today's Date: 09/26/2017 PT Individual Time: 0900-1015 PT Individual Time Calculation (min): 75 min   Short Term Goals: Week 1:  PT Short Term Goal 1 (Week 1): Pt will perform bed mobility with min A  PT Short Term Goal 2 (Week 1): Pt will be able to position slide board and complete transfer with min A  PT Short Term Goal 3 (Week 1): Pt will propel w/c with SPV for >500 ft  PT Short Term Goal 4 (Week 1): Pt will perform car transfer with mod A +1  Skilled Therapeutic Interventions/Progress Updates:   Pt reported no pain prior to treatment onset indicated she wet her brief requiring assist for hygiene. PT provided total A for cleaning BM, changing brief, and LE dressing and TEDs/shoes application. Pt required max A for rolling guiding pelvis and managing LE's. Regression with bed mobility possibly 2/2 to increased LE tone with activity. PT initiated bed level bathing and Pt able to clean UE and upper thigh region only requiring min A for bra application and supervision for donning shirt. PT provided total A for cleaning lower half of LE's. Pt required max A for achieving side lying> sitting edge of bed with supervision for weight shifting in preparation for lateral scoot transfer with slide board> w/c. Pt required mod A for lateral scoot transfer for set up, board placement, and scooting laterally. Pt performed brushing of teeth and hair at sink with mod I for increased time with ADL task. Pt self propelled w/c<> treatment gym for 150 ft with B UE's and supervision provided by therapist. Pt performed lateral scoot transfer to mat with mod A provided by therapist and leg rests applied during transfer. PT notes lateral scoot transfer with leg rests applied is increasingly difficult for both the Pt and therapist due to inability to position legs under Pt for balance during transfer. PT initiated  scapular retraction exercise for 1x10 reps for improved sitting posture and stretching of pec major. Pt able to perform exercise retracting one scapula at a time due to challenge with sitting balance with no UE support. PT initiated dynamic sitting balance incorporating reaching for cards and performing trunk flexion to place cards in correct designated spot. Pt required occasional steadying assist to achieve sitting posture on treatment mat when recovering from functinoal reach with LUE using force through R forearm to recover to sitting position. Pt required frequent rest breaks with activity due to UE fatigue. PT initiated lateral scoot transfer with slide board from treatment mat> w/c with 2 inch block under LE's noting improved efficiency with transfer as opposed to leg rests applied. PT provided mod A during transfer. Pt self propelled w/c back to room and was returned with tray table and call bell in reach and all needs met.   Therapy Documentation Precautions:  Precautions Precautions: Fall Precaution Comments: throracic region, LEs spasicity  Restrictions Weight Bearing Restrictions: Yes LLE Weight Bearing: Non weight bearing Other Position/Activity Restrictions: LLE NWB   See Function Navigator for Current Functional Status.   Therapy/Group: Individual Therapy  Floreen Comber 09/26/2017, 2:04 PM

## 2017-09-26 NOTE — Progress Notes (Signed)
Occupational Therapy Session Note  Patient Details  Name: Sarah [name] MRN: [id number] Date of Birth: [birth year]  Today's Date: 09/26/2017 OT Individual Time: 1430-1526 OT Individual Time Calculation (min): 56 min    Short Term Goals: Week 1:  OT Short Term Goal 1 (Week 1): Pt will transfer to padded tub bench for toileting with mod A  OT Short Term Goal 2 (Week 1): Pt will maintain static sitting balance with one UE support with supervision  OT Short Term Goal 3 (Week 1): Pt will maintain dynamic sitting balance in prep for ADL with min A   Skilled Therapeutic Interventions/Progress Updates:    1;1. Pt requesting to work on UE and core strengthening. Pt supine in bed upon arrival with pain in back, however RN administered medication during session and pain alleviated. Pt supine>sitting MAX A for BLE management and trunk elevation. Pt slide board transfer MOD A EOB>w/c with VC for head hips relationship and hand placement. Pt complete UB therex with BUE shoulder only AROM against gravity flex/ext, horizontal ab/adduction, and chest press. 3# dowel rod use to complete 1x10 internal/external rotation, elbow flexion/extension and scapular elevation/depression. Pt completes 2x10 seated towel glides for core strengthening forward/back, "V's" and trunk rotation. No pain reported. Pt completes 2x20 ball passes (chest pass and bounce pass) for improved postural control and BUE sternthening required for BADLs and transfers. Exited sesison with pt seated in w/c, call light in reach and all needs met  Therapy Documentation Precautions:  Precautions Precautions: Fall Precaution Comments: throracic region, LEs spasicity  Restrictions Weight Bearing Restrictions: Yes LLE Weight Bearing: Non weight bearing Other Position/Activity Restrictions: LLE NWB General:   Vital Signs: Therapy Vitals Temp: 98.2 F (36.8 C) Temp Source: Oral Pulse Rate: 79 Resp: 18 BP: 123/83 Patient Position (if  appropriate): Lying Oxygen Therapy SpO2: 100 % O2 Device: Room Air Pain: Pain Assessment Pain Scale: 0-10 Pain Score: 5  Pain Type: Surgical pain Pain Location: Back Pain Orientation: Mid;Lower Pain Intervention(s): Medication (See eMAR)  See Function Navigator for Current Functional Status.   Therapy/Group: Individual Therapy  Tonny Branch 09/26/2017, 3:25 PM

## 2017-09-26 NOTE — Progress Notes (Signed)
Occupational Therapy Session Note  Patient Details  Name: KARENNA ROMANOFF MRN: [id number] Date of Birth: Feb 04, 1966  Today's Date: 09/26/2017 OT Group Time: 1100-1200 OT Group Time Calculation (min): 60 min   Skilled Therapeutic Interventions/Progress Updates:    Pt participated in therapeutic w/c level dance group with focus on UE/LE strengthening, activity tolerance, and social participation for carryover during self care tasks. Pt was guided through various dance-based exercises involving UB/LB and trunk. She used leg lifter to move and manage LEs. She was actively(!) engaged throughout session, conversing with other group members, dancing, and smiling. At end of tx she self propelled herself back to room.    Therapy Documentation Precautions:  Precautions Precautions: Fall Precaution Comments: throracic region, LEs spasicity  Restrictions Weight Bearing Restrictions: Yes LLE Weight Bearing: Non weight bearing Other Position/Activity Restrictions: LLE NWB ADL: ADL ADL Comments: see functional navigator :    See Function Navigator for Current Functional Status.   Therapy/Group: Group Therapy  Kastin Cerda A Arlin Sass 09/26/2017, 12:32 PM

## 2017-09-27 ENCOUNTER — Inpatient Hospital Stay (HOSPITAL_COMMUNITY): Payer: Medicare Other | Admitting: Occupational Therapy

## 2017-09-27 ENCOUNTER — Other Ambulatory Visit: Payer: Self-pay

## 2017-09-27 ENCOUNTER — Inpatient Hospital Stay (HOSPITAL_COMMUNITY): Payer: Medicare Other

## 2017-09-27 LAB — GLUCOSE, CAPILLARY
GLUCOSE-CAPILLARY: 120 mg/dL — AB (ref 70–99)
GLUCOSE-CAPILLARY: 146 mg/dL — AB (ref 70–99)
GLUCOSE-CAPILLARY: 197 mg/dL — AB (ref 70–99)
Glucose-Capillary: 97 mg/dL (ref 70–99)

## 2017-09-27 MED ORDER — BUTALBITAL-APAP-CAFFEINE 50-325-40 MG PO TABS
1.0000 | ORAL_TABLET | Freq: Two times a day (BID) | ORAL | Status: DC | PRN
  Filled 2017-09-27: qty 1

## 2017-09-27 NOTE — Progress Notes (Signed)
Occupational Therapy Session Note  Patient Details  Name: Sarah [name] MRN: [id number] Date of Birth: [birth year]  Today's Date: 09/27/2017 OT Individual Time: 7416-3845 and 3646-8032 OT Individual Time Calculation (min): 45 min and 28 min  Short Term Goals: Week 1:  OT Short Term Goal 1 (Week 1): Pt will transfer to padded tub bench for toileting with mod A  OT Short Term Goal 2 (Week 1): Pt will maintain static sitting balance with one UE support with supervision  OT Short Term Goal 3 (Week 1): Pt will maintain dynamic sitting balance in prep for ADL with min A   Skilled Therapeutic Interventions/Progress Updates:    Pt greeted in w/c. No c/o pain and agreeable to tx. She was motivated to go to [employer] to get a few snacks for room. Tx focus on UE strengthening, trunk strengthening, and activity tolerance during IADL community-based participation. She self propelled to the cafe destination, retrieved food items, then navigated over threshold to pay for items with increased time. Afterwards she self propelled to central stairwell and instructed pt on techniques for core exercises. She flexed trunk forward to touch stairwell glass and then forward and towards Lt>Rt directions. Education emphasis placed on tightening trunk muscles (visualizing "wearing a corset"). Afterwards she self propelled back to room and transferred back to bed with Mod A using slideboard. She boosted herself up in bed when it was placed in trendelenburg position. Pt left with all needs at session exit.   2nd Session 1:1 tx (28 min)  Pt greeted supine in bed, agreeable to makeup therapy time. Tethered yellow theraband to her bedrails and instructed pt on bedlevel therex x10-15 reps. Increased trunk challenge by having her complete these exercises with unilateral UE involvement while in long sitting. She can pull herself up into this position with steady assist. Also guided pt through gentle yoga-based stretches for  neck, shoulder, and UEs during rest periods. At end of session pt was left with all needs within reach.    Therapy Documentation Precautions:  Precautions Precautions: Fall Precaution Comments: throracic region, LEs spasicity  Restrictions Weight Bearing Restrictions: Yes LLE Weight Bearing: Touchdown weight bearing Other Position/Activity Restrictions: LLE NWB Pain: No c/o pain during session    ADL: ADL ADL Comments: see functional navigator    See Function Navigator for Current Functional Status.   Therapy/Group: Individual Therapy  Brooksie Ellwanger A Barbar Brede 09/27/2017, 4:01 PM

## 2017-09-27 NOTE — Progress Notes (Signed)
Occupational Therapy Session Note  Patient Details  Name: Sarah [name] MRN: [id number] Date of Birth: 10/10/1965  Today's Date: 09/27/2017 OT Individual Time: 1100-1155 OT Individual Time Calculation (min): 55 min    Short Term Goals: Week 2:     Skilled Therapeutic Interventions/Progress Updates:    1:1. Pain not reported during session. Pt requesting to wash hair and go to gift shop. Pt washes hair by leaning forward towards sink. Pt uses BUE with VC fo rest elbows on sink to scrub head to conserve energy while scrubbing head. Pt dries, combs and styles hair with hair dryer for BUE functional endurance/strengthening. Pt propels w/c to/from gift shop with supervision. Inside gift shop OT cues pt to ask for bag to carry items in so that they dont fall off lap. While reaching OT cues pt to lock w/c brakes for safety/keep w/c from rolling backwards with COG shift. Exited session with pt seated in w/c, call light in reach and all needs met.   Therapy Documentation Precautions:  Precautions Precautions: Fall Precaution Comments: throracic region, LEs spasicity  Restrictions Weight Bearing Restrictions: Yes LLE Weight Bearing: Non weight bearing Other Position/Activity Restrictions: LLE NWB General:   Vital Signs: Therapy Vitals Temp: 98.2 F (36.8 C) Temp Source: Oral Pulse Rate: 70 Resp: 17 BP: 121/69 Patient Position (if appropriate): Lying Oxygen Therapy SpO2: 100 % O2 Device: Room Air  See Function Navigator for Current Functional Status.   Therapy/Group: Individual Therapy  Stephanie M Schlosser 09/27/2017, 6:56 AM  

## 2017-09-27 NOTE — Progress Notes (Signed)
Sarah [name] is a 51 y.o. female 1965/04/30 [id number]  Subjective: No new complaints. No new problems. Slept well. Feeling OK.  Objective: Vital signs in last 24 hours: Temp:  [98.1 F (36.7 C)-98.2 F (36.8 C)] 98.2 F (36.8 C) (07/07 0314) Pulse Rate:  [70-79] 70 (07/07 0314) Resp:  [17-18] 17 (07/07 0314) BP: (111-123)/(69-83) 121/69 (07/07 0314) SpO2:  [100 %] 100 % (07/07 0314) Weight change:  Last BM Date: 09/27/17(after suppository)  Intake/Output from previous day: 07/06 0701 - 07/07 0700 In: 600 [P.O.:600] Out: 425 [Urine:425] Last cbgs: CBG (last 3)  Recent Labs    09/26/17 1705 09/26/17 2120 09/27/17 0636  GLUCAP 143* 159* 120*     Physical Exam General: No apparent distress   HEENT: not dry Lungs: Normal effort. Lungs clear to auscultation, no crackles or wheezes. Cardiovascular: Regular rate and rhythm, no edema Abdomen: S/NT/ND; BS(+) Musculoskeletal:  unchanged Neurological: No new neurological deficits Wounds: N/A    Skin: clear   Mental state: Alert, oriented, cooperative    Lab Results: BMET    Component Value Date/Time   NA 135 09/22/2017 0625   NA 145 (H) 08/21/2017 1221   NA 143 01/02/2017 1002   K 4.7 09/22/2017 0625   K 4.0 01/02/2017 1002   CL 100 09/22/2017 0625   CO2 29 09/22/2017 0625   CO2 25 01/02/2017 1002   GLUCOSE 97 09/22/2017 0625   GLUCOSE 87 01/02/2017 1002   BUN 17 09/22/2017 0625   BUN 7 08/21/2017 1221   BUN 11.9 01/02/2017 1002   CREATININE 0.86 09/22/2017 0625   CREATININE 1.00 08/24/2017 1250   CREATININE 0.9 01/02/2017 1002   CALCIUM 8.0 (L) 09/22/2017 0625   CALCIUM 7.2 (L) 09/15/2017 0635   CALCIUM 8.8 01/02/2017 1002   GFRNONAA >60 09/22/2017 0625   GFRNONAA >60 08/24/2017 1250   GFRNONAA 71 06/07/2015 1523   GFRAA >60 09/22/2017 0625   GFRAA >60 08/24/2017 1250   GFRAA 82 06/07/2015 1523   CBC    Component Value Date/Time   WBC 4.7 09/22/2017 0625   RBC 2.77 (L) 09/22/2017 0625   HGB  8.6 (L) 09/22/2017 0625   HGB 10.8 (L) 08/24/2017 1250   HGB 10.7 (L) 08/21/2017 1016   HGB 12.7 01/02/2017 1002   HCT 26.8 (L) 09/22/2017 0625   HCT 33.0 (L) 08/21/2017 1016   HCT 37.4 01/02/2017 1002   PLT 143 (L) 09/22/2017 0625   PLT 320 08/24/2017 1250   PLT 328 08/21/2017 1016   MCV 96.8 09/22/2017 0625   MCV 90 08/21/2017 1016   MCV 87.0 01/02/2017 1002   MCH 31.0 09/22/2017 0625   MCHC 32.1 09/22/2017 0625   RDW 19.3 (H) 09/22/2017 0625   RDW 16.3 (H) 08/21/2017 1016   RDW 14.6 (H) 01/02/2017 1002   LYMPHSABS 0.9 09/22/2017 0625   LYMPHSABS 1.0 01/02/2017 1002   MONOABS 0.2 09/22/2017 0625   MONOABS 0.2 01/02/2017 1002   EOSABS 0.0 09/22/2017 0625   EOSABS 0.1 01/02/2017 1002   BASOSABS 0.0 09/22/2017 0625   BASOSABS 0.0 01/02/2017 1002    Studies/Results: No results found.  Medications: I have reviewed the patient's current medications.  Assessment/Plan:   1.  Myelopathy and paraplegia secondary to metastatic cancer to thoracic spine.  Status post recurrence and a T3 spinal tumor with resection on 09/03/2017.  CIR 2.  DVT prophylaxis with Xarelto 3.  Pain management with baclofen.  The patient is complaining of occasional pain breakthrough and requesting something else  for PRN use. 4.  Depression.  On Zoloft 5.  Left femoral shaft fracture.  Status post intramedullary nailing on 09/11/2017 6.  Hyperglycemia on steroids.  Continue with insulin sliding scale 7.  Hypothyroidism.  Continue with Synthroid 8.  Thrombocytopenia.  Monitoring blood counts 9.  Thrush.  On therapy       Length of stay, days: 6  Walker Kehr , MD 09/27/2017, 10:50 AM

## 2017-09-27 NOTE — Plan of Care (Signed)
  Problem: Consults Goal: RH SPINAL CORD INJURY PATIENT EDUCATION Description  See Patient Education module for education specifics.  Outcome: Progressing Goal: Skin Care Protocol Initiated - if Braden Score 18 or less Description If consults are not indicated, leave blank or document N/A Outcome: Progressing Goal: Nutrition Consult-if indicated Outcome: Progressing   Problem: RH SKIN INTEGRITY Goal: RH STG SKIN FREE OF INFECTION/BREAKDOWN Description Skin wound healing and no new skin breakdown with min assistance  Outcome: Progressing Goal: RH STG MAINTAIN SKIN INTEGRITY WITH ASSISTANCE Description STG Maintain Skin Integrity With min Assistance.  Outcome: Progressing   Problem: RH SAFETY Goal: RH STG ADHERE TO SAFETY PRECAUTIONS W/ASSISTANCE/DEVICE Description STG Adhere to Safety Precautions With min Assistance/Device.  Outcome: Progressing   Problem: RH PAIN MANAGEMENT Goal: RH STG PAIN MANAGED AT OR BELOW PT'S PAIN GOAL Description Pain < or = 4 with min assistance  Outcome: Progressing   Problem: RH KNOWLEDGE DEFICIT SCI Goal: RH STG INCREASE KNOWLEDGE OF SELF CARE AFTER SCI Description Patient able to verbalize safe care activities with min assistance   Outcome: Progressing   Problem: SCI BOWEL ELIMINATION Goal: RH STG MANAGE BOWEL WITH ASSISTANCE Description STG Manage Bowel with mod Assistance.  Outcome: Not Progressing Goal: RH STG SCI MANAGE BOWEL WITH MEDICATION WITH ASSISTANCE Description STG SCI Manage bowel with medication with mod assistance.  Outcome: Not Progressing Goal: RH STG MANAGE BOWEL W/EQUIPMENT W/ASSISTANCE Description STG Manage Bowel With Equipment With mod Assistance  Outcome: Not Progressing Goal: RH STG SCI MANAGE BOWEL PROGRAM W/ASSIST OR AS APPROPRIATE Description STG SCI Manage bowel program w/assist mod   Outcome: Not Progressing   Problem: SCI BLADDER ELIMINATION Goal: RH STG MANAGE BLADDER WITH  ASSISTANCE Description STG Manage Bladder With mod Assistance  Outcome: Not Progressing   Problem: RH SKIN INTEGRITY Goal: RH STG ABLE TO PERFORM INCISION/WOUND CARE W/ASSISTANCE Description STG Able To Perform Incision/Wound Care With mod Assistance.  Outcome: Not Progressing   Patient still requiring max/total assist for bowel movements (had suppository and incontinent episode today requiring 2 people to assist).  Patient also having incontinent episodes of urine and needing intermittent caths.  Patient unable to change dressing to back, requires total assist from caregiver.

## 2017-09-28 ENCOUNTER — Ambulatory Visit: Payer: Self-pay | Admitting: Hematology

## 2017-09-28 ENCOUNTER — Other Ambulatory Visit: Payer: Self-pay

## 2017-09-28 ENCOUNTER — Inpatient Hospital Stay (HOSPITAL_COMMUNITY): Payer: Medicare Other | Admitting: Occupational Therapy

## 2017-09-28 ENCOUNTER — Inpatient Hospital Stay (HOSPITAL_COMMUNITY): Payer: Medicare Other

## 2017-09-28 ENCOUNTER — Other Ambulatory Visit: Payer: Medicare Other

## 2017-09-28 ENCOUNTER — Inpatient Hospital Stay (HOSPITAL_COMMUNITY): Payer: Medicare Other | Admitting: Physical Therapy

## 2017-09-28 DIAGNOSIS — C73 Malignant neoplasm of thyroid gland: Secondary | ICD-10-CM

## 2017-09-28 DIAGNOSIS — K592 Neurogenic bowel, not elsewhere classified: Secondary | ICD-10-CM

## 2017-09-28 LAB — GLUCOSE, CAPILLARY
GLUCOSE-CAPILLARY: 123 mg/dL — AB (ref 70–99)
GLUCOSE-CAPILLARY: 181 mg/dL — AB (ref 70–99)
Glucose-Capillary: 129 mg/dL — ABNORMAL HIGH (ref 70–99)
Glucose-Capillary: 171 mg/dL — ABNORMAL HIGH (ref 70–99)

## 2017-09-28 MED ORDER — TRAMADOL HCL 50 MG PO TABS
50.0000 mg | ORAL_TABLET | Freq: Four times a day (QID) | ORAL | Status: AC | PRN
  Administered 2017-09-28 – 2017-10-11 (×21): 50 mg via ORAL
  Filled 2017-09-28 (×22): qty 1

## 2017-09-28 MED ORDER — DEXAMETHASONE 2 MG PO TABS
2.0000 mg | ORAL_TABLET | Freq: Two times a day (BID) | ORAL | Status: DC
  Administered 2017-09-28 – 2017-09-30 (×4): 2 mg via ORAL
  Filled 2017-09-28 (×4): qty 1

## 2017-09-28 NOTE — Progress Notes (Signed)
Occupational Therapy Session Note  Patient Details  Name: Sarah [name] MRN: [id number] Date of Birth: Mar 13, 1966  Today's Date: 09/28/2017 OT Individual Time: 1052-1140 OT Individual Time Calculation (min): 48 min   Skilled Therapeutic Interventions/Progress Updates:    Pt greeted in bed with NT assisting with elevating pants over hips. Supine<sit completed with Max A, and slideboard<w/c completed with Min-Mod A and instruction on hand placement. For remainder of session, educated pt on pressure relief techniques via laterally leaning Lt>Rt on bed and bending trunk forward onto thighs. Pt required assist for adducting outside leg to increase ease of weight shift/partial buttocks clearance during lateral leans.Pt able to hold pressure relief positions for 1-5 minute intervals while instructed on diaphragmatic breathing technique. Per pt, "This feels so good- I could almost fall asleep here." Discussed importance of incorporating pressure relief during daily routine for skin integrity purposes. Also worked on trunk strengthening with anterior/posterior weight shifts without UE support in prep for meeting ADL bending demands. At end of session pt was left in with all needs and lunch.   Therapy Documentation Precautions:  Precautions Precautions: Fall Precaution Comments: throracic region, LEs spasicity  Restrictions Weight Bearing Restrictions: Yes LLE Weight Bearing: Non weight bearing Other Position/Activity Restrictions: LLE NWB Pain: Pain Assessment Pain Scale: 0-10 Pain Score: 0-No pain ADL: ADL ADL Comments: see functional navigator    See Function Navigator for Current Functional Status.   Therapy/Group: Individual Therapy  Brindy Higginbotham A Caitlin Hillmer 09/28/2017, 12:28 PM

## 2017-09-28 NOTE — Progress Notes (Signed)
Physical Therapy Session Note  Patient Details  Name: Sarah [name] MRN: [id number] Date of Birth: 1965-08-21  Today's Date: 09/28/2017 PT Individual Time: 0900-1000 PT Individual Time Calculation (min): 60 min   Short Term Goals: Week 1:  PT Short Term Goal 1 (Week 1): Pt will perform bed mobility with min A  PT Short Term Goal 2 (Week 1): Pt will be able to position slide board and complete transfer with min A  PT Short Term Goal 3 (Week 1): Pt will propel w/c with SPV for >500 ft  PT Short Term Goal 4 (Week 1): Pt will perform car transfer with mod A +1  Skilled Therapeutic Interventions/Progress Updates:    Pt reports 4/10 back pain with minimal relief of symptoms taking Tylenol. Pt self propelled w/c using B UE's with supervision for 300 feet. PT initiated car and couch lateral scoot transfers with slide board for functional mobility training. Pt able to align w/c and verbalize correct sequencing to perform w/c> car transfer with PT providing board placement and mod A to perform transfer safely. Once in car, PT initiated training with leg lifter to lift LE's into car for proper positioning requiring min A from PT for correct lifter placement and lifting. Pt required 2 helper assist to perform car> w/c lateral scoot transfer due to performing up slope. PT provided education about the correct technique/ sequencing providing min A for lifting LE's out of car with leg lifter. 2 helper assist required for safe transfer to w/c. All transfers PT utilized 4 in block to support LE's for efficiency with transfer. PT initiated couch transfer using block under LE's and providing min A for lateral scoot with slide board from w/c> couch. More efficient 2/2 to down hill transfer into low couch. PT required 2 helper assist for up hill transfer from couch> her manual w/c that she is accustomed to using at home. PT provided sequencing education, board placement, and 2 helper assist for transfer. Pt reported using  her w/c felt like "home" and that she feels more comfortable in her w/c as opposed to the w/c provided by [facility]. Pt propelled w/c back to room   Therapy Documentation Precautions:  Precautions Precautions: Fall Precaution Comments: throracic region, LEs spasicity  Restrictions Weight Bearing Restrictions: Yes LLE Weight Bearing: Non weight bearing Other Position/Activity Restrictions: LLE NWB   See Function Navigator for Current Functional Status.   Therapy/Group: Individual Therapy  Floreen Comber 09/28/2017, 12:24 PM

## 2017-09-28 NOTE — Progress Notes (Signed)
Continues to refuse any insulin CBG 123

## 2017-09-28 NOTE — Progress Notes (Signed)
Physical Therapy Session Note  Patient Details  Name: Sarah [name] MRN: [id number] Date of Birth: 07-04-1965  Today's Date: 09/28/2017 PT Individual Time: 1300-1330 PT Individual Time Calculation (min): 30 min   Short Term Goals: Week 1:  PT Short Term Goal 1 (Week 1): Pt will perform bed mobility with min A  PT Short Term Goal 2 (Week 1): Pt will be able to position slide board and complete transfer with min A  PT Short Term Goal 3 (Week 1): Pt will propel w/c with SPV for >500 ft  PT Short Term Goal 4 (Week 1): Pt will perform car transfer with mod A +1  Skilled Therapeutic Interventions/Progress Updates:    Session focused on w/c mobility on on unit with focus on overall cardiovascular endurane and strength and PT adjusted brakes for improved safety (would recommend still to have w/c vendor look at L brake due to screws being stripped) and NMR to address core/trunk stability during dynamic balance activities with 1 and no UE support without back support (scooted forward in w/c). Pt able to to scoot forwards and backwards in w/c with min assist to facilitate movement.   Therapy Documentation Precautions:  Precautions Precautions: Fall Precaution Comments: throracic region, LEs spasicity  Restrictions Weight Bearing Restrictions: Yes LLE Weight Bearing: Non weight bearing Other Position/Activity Restrictions: LLE NWB Pain: Pain Assessment Pain Scale: 0-10 Pain Score: 0-No pain   See Function Navigator for Current Functional Status.   Therapy/Group: Individual Therapy  [name], PT, DPT  09/28/2017, 1:33 PM

## 2017-09-28 NOTE — Progress Notes (Signed)
Subjective/Complaints:  Throat mouth better with nystatin. Able to swallow much better. No motor and sensory changes in legs. Has had some pain in her back which tylenol didn't cover. Was rx'ed fioricet for some reason  ROS: Patient denies fever, rash, sore throat, blurred vision, nausea, vomiting, diarrhea, cough, shortness of breath or chest pain, , headache, or mood change.   Objective: Vital Signs: Blood pressure 131/75, pulse 72, temperature 98.2 F (36.8 C), temperature source Oral, resp. rate 17, height 5\' 11"  (1.803 m), weight 103.4 kg (227 lb 15.3 oz), last menstrual period 10/22/2016, SpO2 100 %. No results found. Results for orders placed or performed during the hospital encounter of 09/21/17 (from the past 72 hour(s))  Glucose, capillary     Status: Abnormal   Collection Time: 09/25/17 11:40 AM  Result Value Ref Range   Glucose-Capillary 154 (H) 70 - 99 mg/dL   Comment 1 Notify RN   Glucose, capillary     Status: Abnormal   Collection Time: 09/25/17  4:31 PM  Result Value Ref Range   Glucose-Capillary 151 (H) 70 - 99 mg/dL   Comment 1 Notify RN   Glucose, capillary     Status: Abnormal   Collection Time: 09/25/17  9:13 PM  Result Value Ref Range   Glucose-Capillary 155 (H) 70 - 99 mg/dL  Glucose, capillary     Status: Abnormal   Collection Time: 09/26/17  6:45 AM  Result Value Ref Range   Glucose-Capillary 107 (H) 70 - 99 mg/dL  Glucose, capillary     Status: Abnormal   Collection Time: 09/26/17 12:05 PM  Result Value Ref Range   Glucose-Capillary 126 (H) 70 - 99 mg/dL  Glucose, capillary     Status: Abnormal   Collection Time: 09/26/17  5:05 PM  Result Value Ref Range   Glucose-Capillary 143 (H) 70 - 99 mg/dL  Glucose, capillary     Status: Abnormal   Collection Time: 09/26/17  9:20 PM  Result Value Ref Range   Glucose-Capillary 159 (H) 70 - 99 mg/dL   Comment 1 Notify RN   Glucose, capillary     Status: Abnormal   Collection Time: 09/27/17  6:36 AM   Result Value Ref Range   Glucose-Capillary 120 (H) 70 - 99 mg/dL   Comment 1 Notify RN   Glucose, capillary     Status: None   Collection Time: 09/27/17 12:00 PM  Result Value Ref Range   Glucose-Capillary 97 70 - 99 mg/dL  Glucose, capillary     Status: Abnormal   Collection Time: 09/27/17  4:44 PM  Result Value Ref Range   Glucose-Capillary 146 (H) 70 - 99 mg/dL  Glucose, capillary     Status: Abnormal   Collection Time: 09/27/17  9:10 PM  Result Value Ref Range   Glucose-Capillary 197 (H) 70 - 99 mg/dL  Glucose, capillary     Status: Abnormal   Collection Time: 09/28/17  6:18 AM  Result Value Ref Range   Glucose-Capillary 123 (H) 70 - 99 mg/dL     HEENT: white plaque on uvula Cardio: RRR and no murmur Resp: CTA B/L and unlabored GI: BS positive and NT, ND Extremity:  Pulses positive and No Edema Skin:   Wound Ecchymosis but otherwise no drainage Neuro: Alert/Oriented, Abnormal Sensory absent motor and sensory below T2 and Abnormal Motor 5/5 BUE, 0/5 BLE, + babinski bilateral Musc/Skel:  Other no pain with UE AROM, no pain with LE PROM Gen NAD   Assessment/Plan: 1. Functional  deficits secondary to T3 paraplegia which require 3+ hours per day of interdisciplinary therapy in a comprehensive inpatient rehab setting. Physiatrist is providing close team supervision and 24 hour management of active medical problems listed below. Physiatrist and rehab team continue to assess barriers to discharge/monitor patient progress toward functional and medical goals. FIM: Function - Bathing Position: Bed Body parts bathed by patient: Right arm, Left arm, Chest, Abdomen, Right upper leg, Left upper leg, Front perineal area Body parts bathed by helper: Front perineal area, Buttocks, Right lower leg, Left lower leg, Back Assist Level: (mod A)  Function- Upper Body Dressing/Undressing What is the patient wearing?: Bra, Pull over shirt/dress Bra - Perfomed by patient: Thread/unthread right  bra strap, Thread/unthread left bra strap Bra - Perfomed by helper: Hook/unhook bra (pull down sports bra) Pull over shirt/dress - Perfomed by patient: Thread/unthread right sleeve, Thread/unthread left sleeve, Put head through opening Pull over shirt/dress - Perfomed by helper: Pull shirt over trunk Assist Level: Touching or steadying assistance(Pt > 75%) Function - Lower Body Dressing/Undressing What is the patient wearing?: Pants, Ted Hose, Shoes Position: Bed Pants- Performed by patient: Pull pants up/down Pants- Performed by helper: Thread/unthread right pants leg, Thread/unthread left pants leg, Pull pants up/down Shoes - Performed by helper: Don/doff right shoe, Don/doff left shoe, Fasten right, Fasten left TED Hose - Performed by helper: Don/doff right TED hose, Don/doff left TED hose Assist for footwear: Dependant Assist for lower body dressing: (max A)  Function - Toileting Toileting activity did not occur: No continent bowel/bladder event Toileting steps completed by helper: Adjust clothing prior to toileting, Performs perineal hygiene, Adjust clothing after toileting Assist level: Two helpers  Function - Air cabin crew transfer activity did not occur: Safety/medical concerns  Function - Chair/bed transfer Chair/bed transfer method: Lateral scoot Chair/bed transfer assist level: Moderate assist (Pt 50 - 74%/lift or lower) Chair/bed transfer assistive device: Sliding board Chair/bed transfer details: Manual facilitation for placement, Manual facilitation for weight shifting, Verbal cues for technique, Verbal cues for sequencing, Tactile cues for weight shifting, Tactile cues for placement, Tactile cues for sequencing  Function - Locomotion: Wheelchair Will patient use wheelchair at discharge?: Yes Type: Manual Max wheelchair distance: 150 Assist Level: Supervision or verbal cues Wheel 50 feet with 2 turns activity did not occur: Safety/medical concerns Assist  Level: Supervision or verbal cues Wheel 150 feet activity did not occur: Safety/medical concerns Assist Level: Supervision or verbal cues Turns around,maneuvers to table,bed, and toilet,negotiates 3% grade,maneuvers on rugs and over doorsills: Yes Function - Locomotion: Ambulation Ambulation activity did not occur: Safety/medical concerns Walk 10 feet activity did not occur: Safety/medical concerns Walk 50 feet with 2 turns activity did not occur: Safety/medical concerns Walk 150 feet activity did not occur: Safety/medical concerns Walk 10 feet on uneven surfaces activity did not occur: Safety/medical concerns  Function - Comprehension Comprehension: Auditory Comprehension assist level: Understands complex 90% of the time/cues 10% of the time  Function - Expression Expression: Verbal Expression assist level: Expresses complex ideas: With extra time/assistive device  Function - Social Interaction Social Interaction assist level: Interacts appropriately with others with medication or extra time (anti-anxiety, antidepressant).  Function - Problem Solving Problem solving assist level: Solves complex 90% of the time/cues < 10% of the time  Function - Memory Memory assist level: Complete Independence: No helper Patient normally able to recall (first 3 days only): Current season, Location of own room, Staff names and faces, That he or she is in a hospital  Medical Problem List and Plan: 1.  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  DVT Prophylaxis/Anticoagulation/history of pulmonary emboli: Xarelto.  Monitor for any bleeding episodes 3. Pain Management: Baclofen 20 mg nightly,  -add tramadol for more severe pain  -continue tylenol prn 4. Mood: Zoloft 25 mg daily.  Provide emotional support 5. Neuropsych:  This patient is capable of making decisions on her own behalf. 6. Skin/Wound Care: Routine skin checks, surgical sites CDI 7. Fluids/Electrolytes/Nutrition: Routine in and outs with follow-up chemistries 8.  Left femoral shaft fracture.  Status post intramedullary retrograde nailing 09/11/2017.  Nonweightbearing. 9.  Acute blood loss anemia.  Follow-up CBC 7/2 , stable 10.  Hyperglycemia secondary to steroid.  SSI.  will wean decadron to 2mg  q12 CBG (last 3)  Recent Labs    09/27/17 1644 09/27/17 2110 09/28/17 0618  GLUCAP 146* 197* 123*    11.  Neurogenic bowel/ bladder.  Established bowel program, ICP                     12.  Hypothyroidism.  Synthroid 13.  Mild thrombocytopenia r- monitor 14.  Dysphagia due to thrush: nystatin with good results  LOS (Days) 7 A FACE TO FACE EVALUATION WAS PERFORMED  [name] 09/28/2017, 8:49 AM

## 2017-09-28 NOTE — Progress Notes (Signed)
Occupational Therapy Weekly Progress Note  Patient Details  Name: Sarah [name] MRN: [id number] Date of Birth: 02/26/1966  Beginning of progress report period: September 22, 2017 End of progress report period: September 28, 2017  Today's Date: 09/28/2017 OT Individual Time: 3295-1884 OT Individual Time Calculation (min): 72 min    Patient has met 1 of 3 short term goals. Pt making progress towards occupational therapy goals this week. Pt currently requires assistance of 1 person for functional transfer with slideboad. Pt donning UB clothing with min A overall. Pt able to pull pants over B hips but requires assistance to maintain position on side when doing this. Pt requires min A for rolling L <> R with use of bed rail. Pt requires min - mod A for dynamic sitting balance with functional tasks.   Patient continues to demonstrate the following deficits: muscle weakness, decreased cardiorespiratoy endurance, abnormal tone and decreased coordination and decreased sitting balance, decreased balance strategies and difficulty maintaining precautions and therefore will continue to benefit from skilled OT intervention to enhance overall performance with BADL and iADL.  Patient progressing toward long term goals..  Continue plan of care.  OT Short Term Goals Week 1:  OT Short Term Goal 1 (Week 1): Pt will transfer to padded tub bench for toileting with mod A  OT Short Term Goal 1 - Progress (Week 1): Not met OT Short Term Goal 2 (Week 1): Pt will maintain static sitting balance with one UE support with supervision  OT Short Term Goal 2 - Progress (Week 1): Met OT Short Term Goal 3 (Week 1): Pt will maintain dynamic sitting balance in prep for ADL with min A  OT Short Term Goal 3 - Progress (Week 1): Partly met Week 2:  OT Short Term Goal 1 (Week 2): Pt will transfer to padded tub bench with mod A OT Short Term Goal 2 (Week 2): Pt will maintain dynamic sitting balance in prep for ADL with min A.  OT Short Term  Goal 3 (Week 2): Pt will don LB clothing with mod A from bed level.  Skilled Therapeutic Interventions/Progress Updates:    Upon entering the room, pt supine in bed with no c/o pain this session. Pt requests to wash self from bed level with set up A to obtain needed materials. Pt needing assistance to wash B LE's and feet. OT discussed use of LH sponge next session to gain independence. Pt able to doff clothing independently but required min A to pull bra down in back. However, increased difficulty secondary to dressing on upper back as well. Pt rolling L <> R with use of bed rails and min A for LB dressing. Pt needing assistance to thread and pull up B LEs but able to pull over B hips with assistance for positioning on side. Pt performed supine >sit with max A for B LEs and trunk. Pt making several attempts to place slide board with OT adjusting R LE and providing cues for placement. Pt verbally directing transfer with min encouraging cues. Pt performed slide board from bed >wheelchair with mod A overall and min cuing for foot placement. Pt remained NWB on L LE. Pt seated in wheelchair at sink for grooming tasks with set up A to obtain needed items. Pt remained seated in wheelchair with breakfast tray placed in front of her. RN present in room. Call bell and all needed items within reach upon exiting the room.   Therapy Documentation Precautions:  Precautions Precautions: Fall Precaution  Comments: throracic region, LEs spasicity  Restrictions Weight Bearing Restrictions: Yes LLE Weight Bearing: Non weight bearing Other Position/Activity Restrictions: LLE NWB Pain: Pain Assessment Pain Scale: 0-10 Pain Score: 0-No pain ADL: ADL ADL Comments: see functional navigator  See Function Navigator for Current Functional Status.   Therapy/Group: Individual Therapy  [name] 09/28/2017, 11:49 AM

## 2017-09-29 ENCOUNTER — Inpatient Hospital Stay (HOSPITAL_COMMUNITY): Payer: Medicare Other | Admitting: Physical Therapy

## 2017-09-29 ENCOUNTER — Inpatient Hospital Stay (HOSPITAL_COMMUNITY): Payer: Medicare Other | Admitting: Occupational Therapy

## 2017-09-29 LAB — GLUCOSE, CAPILLARY
GLUCOSE-CAPILLARY: 150 mg/dL — AB (ref 70–99)
Glucose-Capillary: 123 mg/dL — ABNORMAL HIGH (ref 70–99)
Glucose-Capillary: 103 mg/dL — ABNORMAL HIGH (ref 70–99)
Glucose-Capillary: 198 mg/dL — ABNORMAL HIGH (ref 70–99)

## 2017-09-29 MED ORDER — POLYETHYLENE GLYCOL 3350 17 G PO PACK
17.0000 g | PACK | Freq: Every day | ORAL | Status: DC
  Administered 2017-10-01 – 2017-10-02 (×2): 17 g via ORAL
  Filled 2017-09-29 (×3): qty 1

## 2017-09-29 MED ORDER — BISACODYL 10 MG RE SUPP
10.0000 mg | Freq: Every day | RECTAL | Status: DC
  Administered 2017-09-30 – 2017-10-29 (×29): 10 mg via RECTAL
  Filled 2017-09-29 (×29): qty 1

## 2017-09-29 NOTE — Progress Notes (Signed)
Occupational Therapy Session Note  Patient Details  Name: Sarah [name] MRN: [id number] Date of Birth: 1965/04/09  Today's Date: 09/29/2017 OT Individual Time: 0715-0826 OT Individual Time Calculation (min): 71 min    Short Term Goals: Week 2:  OT Short Term Goal 1 (Week 2): Pt will transfer to padded tub bench with mod A OT Short Term Goal 2 (Week 2): Pt will maintain dynamic sitting balance in prep for ADL with min A.  OT Short Term Goal 3 (Week 2): Pt will don LB clothing with mod A from bed level.  Skilled Therapeutic Interventions/Progress Updates:    Upon entering the room, pt supine in bed with RN present in the room. Pt engaged in bathing and dressing tasks from bed level. Long handle sponge introduced this session and pt utilized with set up A to wash B LEs and feet this session. OT assisted with donning of B LEs ted hose. Pt unable to pull LEs into circle sitting secondary to increased tone. Pt utilized reacher this session to thread pants onto B feet with min A to pull over ankles. Pt able to pull pants the rest of the way up LEs and rolling L <> R with use of bed rail and min A. Pt able to pull pants over B hips this session. Supine >sit with max A for trunk and B LEs. Pt seated on EOB and able to place slide board this session. Slide board transfer to the R with assistance to place R LE onto leg rest and max A to get to chair this session. Pt with mod multimodal cues for proper technique and hip and head relationship. Pt stating she felt "hung or stuck" on board. Pt seated in wheelchair at sink for grooming tasks with overall set up A. Pt seated in wheelchair with breakfast tray placed and call bell within reach.    Therapy Documentation Precautions:  Precautions Precautions: Fall Precaution Comments: throracic region, LEs spasicity  Restrictions Weight Bearing Restrictions: Yes LLE Weight Bearing: Non weight bearing Other Position/Activity Restrictions: LLE NWB General:    Vital Signs: Therapy Vitals Temp: 98.3 F (36.8 C) Temp Source: Oral Pulse Rate: 72 Resp: 18 BP: 117/73 Patient Position (if appropriate): Lying Oxygen Therapy SpO2: 100 % O2 Device: Room Air Pain: Pain Assessment Pain Scale: 0-10 Pain Score: 2  Pain Type: Surgical pain Pain Location: Back Pain Orientation: Mid;Upper Pain Descriptors / Indicators: Aching;Discomfort Pain Frequency: Intermittent Pain Onset: On-going Patients Stated Pain Goal: 0 Pain Intervention(s): Medication (See eMAR) ADL: ADL ADL Comments: see functional navigator  See Function Navigator for Current Functional Status.   Therapy/Group: Individual Therapy  [name] 09/29/2017, 8:51 AM

## 2017-09-29 NOTE — Significant Event (Signed)
Continues to refuse any and all insulin on a daily basis

## 2017-09-29 NOTE — Progress Notes (Signed)
Physical Therapy Session Note  Patient Details  Name: Sarah [name] MRN: [id number] Date of Birth: 10-Dec-1965  Today's Date: 09/29/2017 PT Individual Time: 3825-0539 PT Individual Time Calculation (min): 45 min   Short Term Goals: Week 1:  PT Short Term Goal 1 (Week 1): Pt will perform bed mobility with min A  PT Short Term Goal 2 (Week 1): Pt will be able to position slide board and complete transfer with min A  PT Short Term Goal 3 (Week 1): Pt will propel w/c with SPV for >500 ft  PT Short Term Goal 4 (Week 1): Pt will perform car transfer with mod A +1  Skilled Therapeutic Interventions/Progress Updates: Pt received seated in w/c, c/o pain as below and reports recently receiving pain medication. Focus of session on bed <>w/c transfers as OT reports difficulty getting OOB this AM, feeling "stuck" in bed. Lateral scoot transfer w/c >bed modA; pt's brief became stuck on transfer board after first scoot, required assist to manage, then able to progress across board min/modA. Discussed with pt option of setting up bed to be slightly downhill when returning to bed, elevating over height of w/c when transferring bed>w/c to reduce energy expenditure and burden of care as pt has hospital bed at home. Sit >supine modA to lift LEs into sidelying, rolling to supine with S. PROM to BLE hamstrings, hip IR/ER, adductors, gastroc, soleus 2x1 min each. Educated pt on gastroc vs soleus stretch with straight and bent knee, need to do both. Rolling R/L minA for assisting placing LE into hooklying then able to roll with bedrail and S. Therapist changed brief totalA. Supine>sit with logroll technique modA from flat bed with bedrails. Lateral scoot to return to w/c minA with assist to progress hips; bed set up slightly higher than w/c. Pt reports practicing lateral leans in therapy but states she has not done any pressure relief on her own; educated pt re: every 30 min in w/c, lateral leans vs anterior leans with elbows  on knees. Provided pt with timer and instructed in how to set for 30 min intervals when up in chair. Recommend pressure mapping to provide visual feedback for patient and to determine most appropriate pressure relief methods. Remained in w/c at end of session, all needs in reach.      Therapy Documentation Precautions:  Precautions Precautions: Fall Precaution Comments: throracic region, LEs spasicity  Restrictions Weight Bearing Restrictions: Yes LLE Weight Bearing: Non weight bearing Other Position/Activity Restrictions: LLE NWB Pain: Pain Assessment Pain Scale: 0-10 Pain Score: 2  Pain Type: Surgical pain Pain Location: Back Pain Orientation: Mid;Upper Pain Descriptors / Indicators: Aching;Discomfort Pain Frequency: Intermittent Pain Onset: On-going Patients Stated Pain Goal: 0 Pain Intervention(s): Medication (See eMAR)   See Function Navigator for Current Functional Status.   Therapy/Group: Individual Therapy  Luberta Mutter 09/29/2017, 9:36 AM

## 2017-09-29 NOTE — Progress Notes (Signed)
Physical Therapy Session Note  Patient Details  Name: Sarah [name] MRN: [id number] Date of Birth: [birth year]  Today's Date: 09/29/2017 PT Individual Time:  - 1000-1100 (60 min)     Short Term Goals: Week 1:  PT Short Term Goal 1 (Week 1): Pt will perform bed mobility with min A  PT Short Term Goal 2 (Week 1): Pt will be able to position slide board and complete transfer with min A  PT Short Term Goal 3 (Week 1): Pt will propel w/c with SPV for >500 ft  PT Short Term Goal 4 (Week 1): Pt will perform car transfer with mod A +1  Skilled Therapeutic Interventions/Progress Updates:     Pt reported no pain prior to treatment session onset. Today's treatment session focused on lateral scoot transfers with slide board and LE hamstrings, ER, and glute max stretching in supine. PT initiated LE stretching to facilitate improved flexibility when utilizing leg lifts and to assist with LE dressing. PT facilitated stretching in supine performing 3x30 seconds bilaterally stretching hamstrings, ER's, and gluteus maximus. PT then initiated lateral scoot transfers with slide board from w/c<>treatment mat using 2 inch block under B LE's for support. Pt able to correctly verbalize and demonstrate correct sequencing to perform transfer safely. Pt performed x5 lateral scoot transfers with therapist providing mod A consistently with occasional min A for boosting. Therapist provided min A for positioning of the slide board providing VC's for patient to perform lateral trunk flexion onto forearm to increase independence with board placement. Pt able to demonstrate ability to lean on forearm to place board requiring HOH A to ensure board is in the correct spot. Pt demonstrated great carry over b/w lateral scoot repetitions and reported she wants to do this task from her bed to simulate home environment. Pt self propelled w/c<> treatment gym with supervision. Pt was positioned in room with all needs met.    Therapy  Documentation Precautions:  Precautions Precautions: Fall Precaution Comments: throracic region, LEs spasicity  Restrictions Weight Bearing Restrictions: Yes LLE Weight Bearing: Non weight bearing Other Position/Activity Restrictions: LLE NWB   See Function Navigator for Current Functional Status.   Therapy/Group: Individual Therapy  Floreen Comber 09/29/2017, 4:43 PM

## 2017-09-29 NOTE — Progress Notes (Signed)
Subjective/Complaints:  Working on transfers with therapy. Still no return of sensory and motor in LE's. Pain better controlled. Incontinent of urine  ROS: Patient denies fever, rash, sore throat, blurred vision, nausea, vomiting, diarrhea, cough, shortness of breath or chest pain, joint or back pain, headache, or mood change.    Objective: Vital Signs: Blood pressure 117/73, pulse 72, temperature 98.3 F (36.8 C), temperature source Oral, resp. rate 18, height 5\' 11"  (1.803 m), weight 103.4 kg (227 lb 15.3 oz), last menstrual period 10/22/2016, SpO2 100 %. No results found. Results for orders placed or performed during the hospital encounter of 09/21/17 (from the past 72 hour(s))  Glucose, capillary     Status: Abnormal   Collection Time: 09/26/17 12:05 PM  Result Value Ref Range   Glucose-Capillary 126 (H) 70 - 99 mg/dL  Glucose, capillary     Status: Abnormal   Collection Time: 09/26/17  5:05 PM  Result Value Ref Range   Glucose-Capillary 143 (H) 70 - 99 mg/dL  Glucose, capillary     Status: Abnormal   Collection Time: 09/26/17  9:20 PM  Result Value Ref Range   Glucose-Capillary 159 (H) 70 - 99 mg/dL   Comment 1 Notify RN   Glucose, capillary     Status: Abnormal   Collection Time: 09/27/17  6:36 AM  Result Value Ref Range   Glucose-Capillary 120 (H) 70 - 99 mg/dL   Comment 1 Notify RN   Glucose, capillary     Status: None   Collection Time: 09/27/17 12:00 PM  Result Value Ref Range   Glucose-Capillary 97 70 - 99 mg/dL  Glucose, capillary     Status: Abnormal   Collection Time: 09/27/17  4:44 PM  Result Value Ref Range   Glucose-Capillary 146 (H) 70 - 99 mg/dL  Glucose, capillary     Status: Abnormal   Collection Time: 09/27/17  9:10 PM  Result Value Ref Range   Glucose-Capillary 197 (H) 70 - 99 mg/dL  Glucose, capillary     Status: Abnormal   Collection Time: 09/28/17  6:18 AM  Result Value Ref Range   Glucose-Capillary 123 (H) 70 - 99 mg/dL  Glucose, capillary      Status: Abnormal   Collection Time: 09/28/17 11:28 AM  Result Value Ref Range   Glucose-Capillary 129 (H) 70 - 99 mg/dL  Glucose, capillary     Status: Abnormal   Collection Time: 09/28/17  4:28 PM  Result Value Ref Range   Glucose-Capillary 171 (H) 70 - 99 mg/dL  Glucose, capillary     Status: Abnormal   Collection Time: 09/28/17  9:01 PM  Result Value Ref Range   Glucose-Capillary 181 (H) 70 - 99 mg/dL   Comment 1 Notify RN   Glucose, capillary     Status: Abnormal   Collection Time: 09/29/17  6:42 AM  Result Value Ref Range   Glucose-Capillary 123 (H) 70 - 99 mg/dL   Comment 1 Notify RN      Constitutional: No distress . Vital signs reviewed. HEENT: EOMI, oral membranes moist Neck: supple Cardiovascular: RRR without murmur. No JVD    Respiratory: CTA Bilaterally without wheezes or rales. Normal effort    GI: BS +, non-tender, non-distended  Neuro: Alert/Oriented, Abnormal Sensory absent motor and sensory below T2 and Abnormal Motor 5/5 BUE, 0/5 BLE, + babinski bilateral Musc/Skel:  Other no pain with UE AROM, no pain with LE PROM Psych: pleasant Skin: surgical wound intact   Assessment/Plan: 1. Functional deficits secondary  to T3 paraplegia which require 3+ hours per day of interdisciplinary therapy in a comprehensive inpatient rehab setting. Physiatrist is providing close team supervision and 24 hour management of active medical problems listed below. Physiatrist and rehab team continue to assess barriers to discharge/monitor patient progress toward functional and medical goals. FIM: Function - Bathing Position: Bed Body parts bathed by patient: Right arm, Left arm, Chest, Abdomen, Right upper leg, Left upper leg, Right lower leg, Left lower leg Body parts bathed by helper: Back Bathing not applicable: Front perineal area, Buttocks Assist Level: Touching or steadying assistance(Pt > 75%), Assistive device Assistive Device Comment: LH sponge  Function- Upper Body  Dressing/Undressing What is the patient wearing?: Pull over shirt/dress Bra - Perfomed by patient: Thread/unthread right bra strap, Thread/unthread left bra strap Bra - Perfomed by helper: Hook/unhook bra (pull down sports bra) Pull over shirt/dress - Perfomed by patient: Thread/unthread right sleeve, Thread/unthread left sleeve, Put head through opening, Pull shirt over trunk Pull over shirt/dress - Perfomed by helper: Pull shirt over trunk Assist Level: Supervision or verbal cues, Set up Set up : To obtain clothing/put away Function - Lower Body Dressing/Undressing What is the patient wearing?: Pants, Maryln Manuel, Shoes Position: Bed Pants- Performed by patient: Pull pants up/down Pants- Performed by helper: Thread/unthread right pants leg, Thread/unthread left pants leg Shoes - Performed by helper: Don/doff right shoe, Don/doff left shoe, Fasten right, Fasten left TED Hose - Performed by helper: Don/doff right TED hose, Don/doff left TED hose Assist for footwear: Dependant Assist for lower body dressing: (max A)  Function - Toileting Toileting activity did not occur: No continent bowel/bladder event Toileting steps completed by patient: Adjust clothing prior to toileting, Performs perineal hygiene, Adjust clothing after toileting(per Jobelle Mesias, NT) Toileting steps completed by helper: Adjust clothing prior to toileting, Performs perineal hygiene, Adjust clothing after toileting Assist level: Two helpers  Function - Air cabin crew transfer activity did not occur: Safety/medical concerns Assist level to toilet: 2 helpers(per Wyvonnia Lora, NT) Assist level from toilet: 2 helpers  Function - Chair/bed transfer Chair/bed transfer method: Lateral scoot Chair/bed transfer assist level: Maximal assist (Pt 25 - 49%/lift and lower) Chair/bed transfer assistive device: Sliding board Chair/bed transfer details: Manual facilitation for placement, Manual facilitation for weight  shifting, Verbal cues for technique, Verbal cues for sequencing, Tactile cues for weight shifting, Tactile cues for placement, Tactile cues for sequencing, Verbal cues for safe use of DME/AE, Verbal cues for precautions/safety, Manual facilitation for weight bearing  Function - Locomotion: Wheelchair Will patient use wheelchair at discharge?: Yes Type: Manual Max wheelchair distance: 300 Assist Level: Supervision or verbal cues Wheel 50 feet with 2 turns activity did not occur: Safety/medical concerns Assist Level: Supervision or verbal cues Wheel 150 feet activity did not occur: Safety/medical concerns Assist Level: Supervision or verbal cues Turns around,maneuvers to table,bed, and toilet,negotiates 3% grade,maneuvers on rugs and over doorsills: Yes Function - Locomotion: Ambulation Ambulation activity did not occur: Safety/medical concerns Walk 10 feet activity did not occur: Safety/medical concerns Walk 50 feet with 2 turns activity did not occur: Safety/medical concerns Walk 150 feet activity did not occur: Safety/medical concerns Walk 10 feet on uneven surfaces activity did not occur: Safety/medical concerns  Function - Comprehension Comprehension: Auditory Comprehension assist level: Understands complex 90% of the time/cues 10% of the time  Function - Expression Expression: Verbal Expression assist level: Expresses complex ideas: With extra time/assistive device  Function - Social Interaction Social Interaction assist level: Interacts appropriately with others  with medication or extra time (anti-anxiety, antidepressant).  Function - Problem Solving Problem solving assist level: Solves complex 90% of the time/cues < 10% of the time  Function - Memory Memory assist level: Complete Independence: No helper Patient normally able to recall (first 3 days only): Current season, Location of own room, Staff names and faces, That he or she is in a hospital   Medical Problem List  and Plan: 1.  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 Team conf -no motor/sensory return LE's 2.  DVT Prophylaxis/Anticoagulation/history of pulmonary emboli: Xarelto.  Monitor for any bleeding episodes 3. Pain Management: Baclofen 20 mg nightly,  -added tramadol for more severe pain  -continue tylenol prn 4. Mood: Zoloft 25 mg daily.  Provide emotional support 5. Neuropsych: This patient is capable of making decisions on her own behalf. 6. Skin/Wound Care: Routine skin checks, surgical sites CDI 7. Fluids/Electrolytes/Nutrition: Routine in and outs with follow-up chemistries 8.  Left femoral shaft fracture.  Status post intramedullary retrograde nailing 09/11/2017.  Nonweightbearing. 9.  Acute blood loss anemia.  Follow-up CBC 7/2 , stable 10.  Hyperglycemia secondary to steroid.  SSI.  decreased decadron to 2mg  q12 on 7/8 CBG (last 3)  Recent Labs    09/28/17 1628 09/28/17 2101 09/29/17 0642  GLUCAP 171* 181* 123*    -sugars should continue to drop 11.  Neurogenic bowel/ bladder.    -needs am  Bowel program  -may do better with I/o caths instead of constant incontinence   -will talk with team/RN 12.  Hypothyroidism.  Synthroid 13.  Mild thrombocytopenia r- monitor 14.  Dysphagia due to thrush: nystatin with good results  LOS (Days) 8 A FACE TO FACE EVALUATION WAS PERFORMED  [name] 09/29/2017, 9:00 AM

## 2017-09-30 ENCOUNTER — Inpatient Hospital Stay (HOSPITAL_COMMUNITY): Payer: Medicare Other | Admitting: Occupational Therapy

## 2017-09-30 ENCOUNTER — Inpatient Hospital Stay (HOSPITAL_COMMUNITY): Payer: Medicare Other

## 2017-09-30 ENCOUNTER — Inpatient Hospital Stay (HOSPITAL_COMMUNITY): Payer: Medicare Other | Admitting: Physical Therapy

## 2017-09-30 LAB — URINALYSIS, COMPLETE (UACMP) WITH MICROSCOPIC
Bilirubin Urine: NEGATIVE
Glucose, UA: NEGATIVE mg/dL
KETONES UR: NEGATIVE mg/dL
Nitrite: POSITIVE — AB
PH: 5 (ref 5.0–8.0)
Protein, ur: 30 mg/dL — AB
Specific Gravity, Urine: 1.017 (ref 1.005–1.030)
WBC, UA: >50 WBC/hpf — ABNORMAL HIGH (ref 0–5)

## 2017-09-30 LAB — GLUCOSE, CAPILLARY
GLUCOSE-CAPILLARY: 158 mg/dL — AB (ref 70–99)
Glucose-Capillary: 95 mg/dL (ref 70–99)
Glucose-Capillary: 118 mg/dL — ABNORMAL HIGH (ref 70–99)
Glucose-Capillary: 180 mg/dL — ABNORMAL HIGH (ref 70–99)

## 2017-09-30 MED ORDER — BACLOFEN 5 MG HALF TABLET
5.0000 mg | ORAL_TABLET | Freq: Two times a day (BID) | ORAL | Status: DC
  Administered 2017-09-30 – 2017-10-09 (×18): 5 mg via ORAL
  Filled 2017-09-30 (×18): qty 1

## 2017-09-30 MED ORDER — OXYBUTYNIN CHLORIDE 5 MG PO TABS
5.0000 mg | ORAL_TABLET | Freq: Two times a day (BID) | ORAL | Status: DC
  Administered 2017-09-30 – 2017-10-02 (×5): 5 mg via ORAL
  Filled 2017-09-30 (×5): qty 1

## 2017-09-30 MED ORDER — FLUTICASONE PROPIONATE 50 MCG/ACT NA SUSP
1.0000 | NASAL | Status: DC
  Administered 2017-09-30 – 2017-10-29 (×29): 1 via NASAL

## 2017-09-30 MED ORDER — DEXAMETHASONE 2 MG PO TABS
1.0000 mg | ORAL_TABLET | Freq: Two times a day (BID) | ORAL | Status: DC
  Administered 2017-09-30 – 2017-10-02 (×4): 1 mg via ORAL
  Filled 2017-09-30 (×4): qty 1

## 2017-09-30 NOTE — Progress Notes (Signed)
Physical Therapy Session Note  Patient Details  Name: Sarah [name] MRN: [id number] Date of Birth: Aug 29, 1965  Today's Date: 09/30/2017 PT Individual Time: 7282-0601 PT Individual Time Calculation (min): 30 min   Short Term Goals: Week 1:  PT Short Term Goal 1 (Week 1): Pt will perform bed mobility with min A  PT Short Term Goal 2 (Week 1): Pt will be able to position slide board and complete transfer with min A  PT Short Term Goal 3 (Week 1): Pt will propel w/c with SPV for >500 ft  PT Short Term Goal 4 (Week 1): Pt will perform car transfer with mod A +1      Skilled Therapeutic Interventions/Progress Updates:  Pt stated that her L knee is swollen and somewhat painful.  Thigh high TEDS noted to be constricting superior to knee; PT assisted pt in pulling them up.    Pt propelled w/c on level tile with many turns, up/down 3% grade, with cues for efficiency and turns.  Pt stated she thought she was wet, and requested getting back to bed.   Slide board transfer w/c> bed to L, with pt directing care without cues.  Pt needed assistance for removing L armrest as cushion was stuck under it, placement of slide board, placing foot stool under feet, and wt shifting across board.  Pt noted to be soaked, with apparent bowel accident as well.  Pt used call bell to ask for assistance.  PT assisted her into supine, and left her resting in bed with needs at hand. Erin, NT stated she would be in to help pt soon.     Therapy Documentation Precautions:  Precautions Precautions: Fall Precaution Comments: throracic region, LEs spasicity  Restrictions Weight Bearing Restrictions: Yes LLE Weight Bearing: Touchdown weight bearing Other Position/Activity Restrictions: LLE NWB  Pain: 3/10 L knee; edema noted; PT recommended pt request ice when in room      See Function Navigator for Current Functional Status.   Therapy/Group: Individual Therapy  Raymond Azure 09/30/2017, 2:49 PM

## 2017-09-30 NOTE — Progress Notes (Signed)
Occupational Therapy Session Note  Patient Details  Name: Sarah [name] MRN: [id number] Date of Birth: 1965/04/30  Today's Date: 09/30/2017 OT Individual Time: 5396-7289 OT Individual Time Calculation (min): 45 min    Short Term Goals: Week 1:  OT Short Term Goal 1 (Week 1): Pt will transfer to padded tub bench for toileting with mod A  OT Short Term Goal 1 - Progress (Week 1): Not met OT Short Term Goal 2 (Week 1): Pt will maintain static sitting balance with one UE support with supervision  OT Short Term Goal 2 - Progress (Week 1): Met OT Short Term Goal 3 (Week 1): Pt will maintain dynamic sitting balance in prep for ADL with min A  OT Short Term Goal 3 - Progress (Week 1): Partly met Week 2:  OT Short Term Goal 1 (Week 2): Pt will transfer to padded tub bench with mod A OT Short Term Goal 2 (Week 2): Pt will maintain dynamic sitting balance in prep for ADL with min A.  OT Short Term Goal 3 (Week 2): Pt will don LB clothing with mod A from bed level.  Skilled Therapeutic Interventions/Progress Updates:    Pt received in w/c and discussed the plan for therapy. Pt stated she was feeling too tired to work on more transfers but agreeable to balance/ trunk exercises.    Pt worked on various exercises to facilitate core strength: -Trunk flexion to pull from reclined sit to upright sit -Lateral lean with slight hip lift by keeping shoulders symmetrical (vs. Leaning over as she does for pressure relief) - sitting in front of sink with hands on sink - pt pulled self to fully upright posture focusing on trunk extension and alternating lifting one hand at a time.  She was unable to hold posture with both hands lifted  For UE strength, pt worked on pilates arm circles with arms in forward flex, abduction and then one arm at a time overhead.   Pt tolerated all the exercises well.    Pt set up with tray table to prepare for lunch.  Therapy Documentation Precautions:  Precautions Precautions:  Fall Precaution Comments: throracic region, LEs spasicity  Restrictions Weight Bearing Restrictions: Yes LLE Weight Bearing: Non weight bearing Other Position/Activity Restrictions: LLE NWB   Pain: Pain Assessment Pain Scale: 0-10 Pain Score: 3  Pain Type: Surgical pain Pain Location: Back Pain Orientation: Mid Pain Descriptors / Indicators: Aching Pain Frequency: Constant Pain Onset: On-going Patients Stated Pain Goal: 0 Pain Intervention(s): Medication (See eMAR) ADL: ADL ADL Comments: see functional navigator   See Function Navigator for Current Functional Status.   Therapy/Group: Individual Therapy  [place] 09/30/2017, 12:34 PM

## 2017-09-30 NOTE — Patient Care Conference (Signed)
Inpatient RehabilitationTeam Conference and Plan of Care Update Date: 09/29/2017   Time: 2:05 PM    Patient Name: Sarah [name]      Medical Record Number: [id number]  Date of Birth: Jul 03, 1965 Sex: Female         Room/Bed: 4W18C/4W18C-01 Payor Info: Payor: [employer] MEDICARE / Plan: [facility] MEDICARE / Product Type: *No Product type* /    Admitting Diagnosis: T4 Para  Admit Date/Time:  09/21/2017  4:57 PM Admission Comments: No comment available   Primary Diagnosis:  <principal problem not specified> Principal Problem: <principal problem not specified>  Patient Active Problem List   Diagnosis Date Noted  . Myelopathy ([place]) 09/21/2017  . Muscle spasms of both lower extremities   . Closed fracture of shaft of left femur ([place])   . Acute blood loss anemia   . Hypothyroidism   . Nausea & vomiting 09/15/2017  . Closed fracture of left distal femur ([place]) 09/11/2017  . Paraplegia ([place]) 09/08/2017  . Obesity (BMI 30-39.9) 09/03/2017  . Status post surgery 09/03/2017  . Calculus of gallbladder without cholecystitis without obstruction   . Biliary dyskinesia   . Intractable nausea and vomiting 08/09/2017  . Thrombocytopenia ([place]) 08/09/2017  . Elevated bilirubin 08/09/2017  . AKI (acute kidney injury) ([place]) 08/09/2017  . Nasal congestion 04/05/2017  . Breast pain 01/26/2017  . Upper respiratory infection, viral 01/26/2017  . Counseling regarding advanced care planning and goals of care 01/02/2017  . Bilateral rotator cuff syndrome 02/12/2016  . E. coli UTI 08/01/2015  . Constipation due to neurogenic bowel 08/01/2015  . Muscle spasticity   . 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 Spastic paraparesis   . Neurogenic bladder   . History of pulmonary embolism   . Post-operative pain   . Metastatic cancer ([place])   . Steroid-induced hyperglycemia   . Depression   . Obstipation   . Thoracic spine tumor 07/06/2015  . 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 Spinal  cord compression due to malignant neoplasm metastatic to spine ([place])   . Postoperative hypothyroidism   . 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 Spasticity 09/19/2014  . Dysuria 09/04/2014  . Hypertension 05/01/2014  . Ingrown right big toenail 05/01/2014  . GERD (gastroesophageal reflux disease) 02/14/2014  . Dysphagia, pharyngoesophageal phase 02/06/2014  . Type 2 diabetes mellitus without complication ([place]) 36/64/4034  . Constipation   . Neurogenic bowel   . Paraplegia at T4 level ([place]) 01/20/2014  . Metastatic cancer to spine ([place]) 01/19/2014  . Rotator cuff tendonitis   . 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 Leg weakness, bilateral 12/13/2013    Expected Discharge Date: Expected Discharge Date: 10/15/17  Team Members Present: Physician leading conference: Dr. Alger Simons Social Worker Present: Lennart Pall, LCSW Nurse Present: Arelia Sneddon, RN PT Present: Dwyane Dee, PT OT Present: Napoleon Form, OT SLP Present: Weston Anna, SLP PPS Coordinator present : Daiva Nakayama, RN, CRRN     Current Status/Progress Goal Weekly Team Focus  Medical   hurthle cell ca, thoracic paraplegia and neurogenic bowels and bladder,   improve bladder and bowel programs  see above, see chart, weaning steroids   Bowel/Bladder   Incontinent of B/B LBM 07/08 pt takes suppository at night prn  able to manage bowel and bladder with assistance  timed toileting assess bowel pattern q shift suppository prn    Swallow/Nutrition/ Hydration  ADL's   mod - max A with slide board for functional transfers, min A UB self care, max - total A for LB dressing bed level, set up A for grooming tasks  setup/ supervision overall   transfer training, ADL retraining at bed level, toileting, dynamic sitting balance, pt education, endurance, strength   Mobility   mod A for slide board transfer and bed  mobility for LE management   CGA for slide board transfer and min A for car transfer; mod I for w/c propulsion. May downgrade to min A overall  slide board transfers, bed mobility, dynamic sitting balance, and w/c propulsion    Communication             Safety/Cognition/ Behavioral Observations            Pain   Pt has tylenol prn for back pain tramadol added 07/08 for pain unrelieved with tylenol  pt will have pain that is <=2/10  assess for pain q shift and prn-   Skin   Sutures in left anterior and lateral left knee,  MASD groin and periarea , blister right thigh with foam, gauze dressing with thin film on upper right back/scapula area  no s/sx of infection, improvement of current skin issues, no further skin breakdown  assess skin q shift and prn continue treatments as ordered    Rehab Goals Patient on target to meet rehab goals: Yes *See Care Plan and progress notes for long and short-term goals.     Barriers to Discharge  Current Status/Progress Possible Resolutions Date Resolved   Physician    Medical stability;Neurogenic Bowel & Bladder        see medical progress notes      Nursing                  PT                    OT                  SLP                SW                Discharge Planning/Teaching Needs:  Plan home with family who can provide only intermittent assistance.      Team Discussion:  Returning CIR pt with reoccurrence of CA.  Anticipate d/c at w/c level.  Will likely need to learn I/O caths and address bowel program.  Min - max overall for self-care.  Cannot do circle sitting due to tone in LEs.  Mod assist with transfers and req +2 to uneven surfaces.  Anticipate need to downgrade goals to min assist overall.  Discussion of possible foley due to only intermittent support of family.  Therapists considering how to coordinate pt's assist needs for when family/ friends are more available.  SW to follow up with her as well.  Revisions to Treatment Plan:   May need to downgrade some goals to min assist.    Continued Need for Acute Rehabilitation Level of Care: The patient requires daily medical management by a physician with specialized training in physical medicine and rehabilitation for the following conditions: Daily direction of a multidisciplinary physical rehabilitation program to ensure safe treatment while eliciting the highest outcome that is of practical value to the patient.: Yes Daily medical management of patient stability for increased activity during participation in an intensive rehabilitation regime.: Yes Daily analysis of  laboratory values and/or radiology reports with any subsequent need for medication adjustment of medical intervention for : Post surgical problems;Neurological problems  Kaelea Gathright 09/30/2017, 11:55 AM

## 2017-09-30 NOTE — Progress Notes (Signed)
Occupational Therapy Session Note  Patient Details  Name: Sarah [name] MRN: [id number] Date of Birth: [birth year]  Today's Date: 09/30/2017 OT Individual Time:  0705- 0815   skilled OT intervention: 70 minutes   Short Term Goals: Week 2:  OT Short Term Goal 1 (Week 2): Pt will transfer to padded tub bench with mod A OT Short Term Goal 2 (Week 2): Pt will maintain dynamic sitting balance in prep for ADL with min A.  OT Short Term Goal 3 (Week 2): Pt will don LB clothing with mod A from bed level.  Skilled Therapeutic Interventions/Progress Updates:    Upon entering the room, pt supine in bed awaiting therapist arrival with no c/o pain. Pt requesting to wash from bed level with set up A to obtain all needed items. Pt utilized LH sponge to increase I with LB self care. Pt able to don pull over shirt and bra with HOB at 30 degrees. OT assisted pt with threading B TED hose and pt pulling up towards thigh. Pt utilized reacher to thread pants onto B feet this session. Pt continues to require min A for rolling L <> R and to pull pants over B hips. Pt performed supine >sit with mod A and attempting to utilize pants to pull LEs over bed ledge. OT lifted pts R LE while she placed slide board and checked for appropriate position. Pt sliding from bed >wheelchair, towards right, with mod A this session. Once in wheelchair, pt propelled to sink to wash hair and perform grooming tasks with overall set up a to obtain all needed items. Pt set up with breakfast tray before exiting the room. All needs within reach.   Therapy Documentation Precautions:  Precautions Precautions: Fall Precaution Comments: throracic region, LEs spasicity  Restrictions Weight Bearing Restrictions: Yes LLE Weight Bearing: Touchdown weight bearing Other Position/Activity Restrictions: LLE NWB General:   Vital Signs: Therapy Vitals Temp: 98 F (36.7 C) Pulse Rate: 90 Resp: 18 BP: 118/76 Patient Position (if appropriate):  Sitting Oxygen Therapy SpO2: 100 % O2 Device: Room Air Pain:   ADL: ADL ADL Comments: see functional navigator  See Function Navigator for Current Functional Status.   Therapy/Group: Individual Therapy  [name] 09/30/2017, 5:00 PM

## 2017-09-30 NOTE — Progress Notes (Signed)
Physical Therapy Session Note  Patient Details  Name: Sarah [name] MRN: [id number] Date of Birth: 1965/10/09  Today's Date: 09/30/2017 PT Individual Time: 8828-0034 PT Individual Time Calculation (min): 30 min   Short Term Goals: Week 1:  PT Short Term Goal 1 (Week 1): Pt will perform bed mobility with min A  PT Short Term Goal 2 (Week 1): Pt will be able to position slide board and complete transfer with min A  PT Short Term Goal 3 (Week 1): Pt will propel w/c with SPV for >500 ft  PT Short Term Goal 4 (Week 1): Pt will perform car transfer with mod A +1  Skilled Therapeutic Interventions/Progress Updates:     Pt reported no pain prior to treatment session onset. Pt self propelled w/c using B UE's from room<>gym for UE strengthening during functional mobility task for ~300 feet. Pt performed slide board transfer from w/c<> mat with mod A provided by therapist with ability to correctly verbalize and demonstrate sequencing using 1 inch blocked under B LE's for support. Pt noted she had experienced a voiding episode indicated by wetness to w/c cushion and was returned to room for hygience maintenance. Pt aligned w/c for lateral scoot transfer to bed and indicated bed needed to be lowered to increase efficiency with lateral scoot transfer. Pt performed transfer with mod A and performed sitting>supine with mod A for LE management. Pt able to don/doff LE pants with min A using leg lifter. PT provided total A for cleaning and brief change with Pt performing rolling with steadying A at pelvis using guard rails for assistance. Pt performed multiple rolls in both directions for increased UE strength with functional task. Pt performed supine>sitting EOB with HOB elevated with therapist providing max A at pelvis and LE's possibly 2/2 to increased fatigue levels. Pt performed lateral scoot transfer from EOB>w/c with 4 inch step and bed elevated to perform transfer down slope for efficiency. PT provided mod A  with verbal cues to maintain head hip relationship and UE placement to prevent fingers from being crushed under board with improper placement. Pt was returned to her chair and able to posterior weight shift to achieve proper position. Pt was left seated with call bell and tray table in reach and all needs met.   Therapy Documentation Precautions:  Precautions Precautions: Fall Precaution Comments: throracic region, LEs spasicity  Restrictions Weight Bearing Restrictions: Yes LLE Weight Bearing: Touchdown weight bearing Other Position/Activity Restrictions: LLE NWB   See Function Navigator for Current Functional Status.   Therapy/Group: Individual Therapy  Floreen Comber 09/30/2017, 3:29 PM

## 2017-09-30 NOTE — Progress Notes (Signed)
Subjective/Complaints:  Working on transfers with therapy. Still no return of sensory and motor in LE's. Pain better controlled. Incontinent of urine  ROS: Patient denies fever, rash, sore throat, blurred vision, nausea, vomiting, diarrhea, cough, shortness of breath or chest pain, joint or back pain, headache, or mood change.    Objective: Vital Signs: Blood pressure 109/77, pulse 69, temperature 98.7 F (37.1 C), temperature source Oral, resp. rate 18, height 5\' 11"  (1.803 m), weight 103.3 kg (227 lb 13.2 oz), last menstrual period 10/22/2016, SpO2 100 %. No results found. Results for orders placed or performed during the hospital encounter of 09/21/17 (from the past 72 hour(s))  Glucose, capillary     Status: None   Collection Time: 09/27/17 12:00 PM  Result Value Ref Range   Glucose-Capillary 97 70 - 99 mg/dL  Glucose, capillary     Status: Abnormal   Collection Time: 09/27/17  4:44 PM  Result Value Ref Range   Glucose-Capillary 146 (H) 70 - 99 mg/dL  Glucose, capillary     Status: Abnormal   Collection Time: 09/27/17  9:10 PM  Result Value Ref Range   Glucose-Capillary 197 (H) 70 - 99 mg/dL  Glucose, capillary     Status: Abnormal   Collection Time: 09/28/17  6:18 AM  Result Value Ref Range   Glucose-Capillary 123 (H) 70 - 99 mg/dL  Glucose, capillary     Status: Abnormal   Collection Time: 09/28/17 11:28 AM  Result Value Ref Range   Glucose-Capillary 129 (H) 70 - 99 mg/dL  Glucose, capillary     Status: Abnormal   Collection Time: 09/28/17  4:28 PM  Result Value Ref Range   Glucose-Capillary 171 (H) 70 - 99 mg/dL  Glucose, capillary     Status: Abnormal   Collection Time: 09/28/17  9:01 PM  Result Value Ref Range   Glucose-Capillary 181 (H) 70 - 99 mg/dL   Comment 1 Notify RN   Glucose, capillary     Status: Abnormal   Collection Time: 09/29/17  6:42 AM  Result Value Ref Range   Glucose-Capillary 123 (H) 70 - 99 mg/dL   Comment 1 Notify RN   Glucose, capillary      Status: Abnormal   Collection Time: 09/29/17 11:54 AM  Result Value Ref Range   Glucose-Capillary 103 (H) 70 - 99 mg/dL  Glucose, capillary     Status: Abnormal   Collection Time: 09/29/17  4:45 PM  Result Value Ref Range   Glucose-Capillary 198 (H) 70 - 99 mg/dL  Glucose, capillary     Status: Abnormal   Collection Time: 09/29/17  8:54 PM  Result Value Ref Range   Glucose-Capillary 150 (H) 70 - 99 mg/dL  Glucose, capillary     Status: None   Collection Time: 09/30/17  6:24 AM  Result Value Ref Range   Glucose-Capillary 95 70 - 99 mg/dL     Constitutional: No distress . Vital signs reviewed. HEENT: EOMI, oral membranes moist Neck: supple Cardiovascular: RRR without murmur. No JVD    Respiratory: CTA Bilaterally without wheezes or rales. Normal effort    GI: BS +, non-tender, non-distended  Neuro: Alert/Oriented, Abnormal Sensory absent motor and sensory below T2 and Abnormal Motor 5/5 BUE, 0/5 BLE, + babinski bilateral Musc/Skel:  Other no pain with UE AROM, no pain with LE PROM Psych: pleasant Skin: surgical wound intact   Assessment/Plan: 1. Functional deficits secondary to T3 paraplegia which require 3+ hours per day of interdisciplinary therapy in a comprehensive  inpatient rehab setting. Physiatrist is providing close team supervision and 24 hour management of active medical problems listed below. Physiatrist and rehab team continue to assess barriers to discharge/monitor patient progress toward functional and medical goals. FIM: Function - Bathing Position: Bed Body parts bathed by patient: Right arm, Left arm, Chest, Abdomen, Right upper leg, Left upper leg, Right lower leg, Left lower leg Body parts bathed by helper: Back Bathing not applicable: Front perineal area, Buttocks Assist Level: Touching or steadying assistance(Pt > 75%), Assistive device Assistive Device Comment: LH sponge  Function- Upper Body Dressing/Undressing What is the patient wearing?: Pull  over shirt/dress Bra - Perfomed by patient: Thread/unthread right bra strap, Thread/unthread left bra strap Bra - Perfomed by helper: Hook/unhook bra (pull down sports bra) Pull over shirt/dress - Perfomed by patient: Thread/unthread right sleeve, Thread/unthread left sleeve, Put head through opening, Pull shirt over trunk Pull over shirt/dress - Perfomed by helper: Pull shirt over trunk Assist Level: Supervision or verbal cues, Set up Set up : To obtain clothing/put away Function - Lower Body Dressing/Undressing What is the patient wearing?: Pants, Maryln Manuel, Shoes Position: Bed Pants- Performed by patient: Pull pants up/down Pants- Performed by helper: Thread/unthread right pants leg, Thread/unthread left pants leg Shoes - Performed by helper: Don/doff right shoe, Don/doff left shoe, Fasten right, Fasten left TED Hose - Performed by helper: Don/doff right TED hose, Don/doff left TED hose Assist for footwear: Dependant Assist for lower body dressing: (max A)  Function - Toileting Toileting activity did not occur: No continent bowel/bladder event Toileting steps completed by patient: Adjust clothing prior to toileting, Performs perineal hygiene, Adjust clothing after toileting(per Jobelle Mesias, NT) Toileting steps completed by helper: Adjust clothing prior to toileting, Performs perineal hygiene, Adjust clothing after toileting Assist level: Two helpers  Function - Air cabin crew transfer activity did not occur: Safety/medical concerns Assist level to toilet: 2 helpers(per Wyvonnia Lora, NT) Assist level from toilet: 2 helpers  Function - Chair/bed transfer Chair/bed transfer method: Lateral scoot Chair/bed transfer assist level: Moderate assist (Pt 50 - 74%/lift or lower) Chair/bed transfer assistive device: Armrests, Sliding board Chair/bed transfer details: Manual facilitation for placement, Manual facilitation for weight shifting, Verbal cues for technique, Verbal cues  for sequencing, Tactile cues for weight shifting, Tactile cues for placement, Tactile cues for sequencing, Verbal cues for safe use of DME/AE, Verbal cues for precautions/safety, Manual facilitation for weight bearing  Function - Locomotion: Wheelchair Will patient use wheelchair at discharge?: Yes Type: Manual Max wheelchair distance: 300 Assist Level: Supervision or verbal cues Wheel 50 feet with 2 turns activity did not occur: Safety/medical concerns Assist Level: Supervision or verbal cues Wheel 150 feet activity did not occur: Safety/medical concerns Assist Level: Supervision or verbal cues Turns around,maneuvers to table,bed, and toilet,negotiates 3% grade,maneuvers on rugs and over doorsills: Yes Function - Locomotion: Ambulation Ambulation activity did not occur: Safety/medical concerns Walk 10 feet activity did not occur: Safety/medical concerns Walk 50 feet with 2 turns activity did not occur: Safety/medical concerns Walk 150 feet activity did not occur: Safety/medical concerns Walk 10 feet on uneven surfaces activity did not occur: Safety/medical concerns  Function - Comprehension Comprehension: Auditory Comprehension assist level: Understands basic 90% of the time/cues < 10% of the time  Function - Expression Expression: Verbal Expression assist level: Expresses complex ideas: With extra time/assistive device  Function - Social Interaction Social Interaction assist level: Interacts appropriately with others with medication or extra time (anti-anxiety, antidepressant).  Function - Problem Solving Problem  solving assist level: Solves complex 90% of the time/cues < 10% of the time  Function - Memory Memory assist level: Complete Independence: No helper Patient normally able to recall (first 3 days only): Current season, Location of own room, Staff names and faces, That he or she is in a hospital   Medical Problem List and Plan: 1.  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 Team conf -no motor/sensory return LE's 2.  DVT Prophylaxis/Anticoagulation/history of pulmonary emboli: Xarelto.  Monitor for any bleeding episodes 3. Pain Management: Baclofen 20 mg nightly,  -  tramadol for more severe pain  -continue tylenol prn 4. Mood: Zoloft 25 mg daily.  Provide emotional support 5. Neuropsych: This patient is capable of making decisions on her own behalf. 6. Skin/Wound Care: Routine skin checks, surgical sites CDI 7. Fluids/Electrolytes/Nutrition: Routine in and outs with follow-up chemistries 8.  Left femoral shaft fracture.  Status post intramedullary retrograde nailing 09/11/2017.  Nonweightbearing. 9.  Acute blood loss anemia.  Follow-up CBC 7/2 , stable 10.  Hyperglycemia secondary to steroid.  SSI.  decreased decadron to 2mg  q12 on 7/8---> decr to 1mg  today CBG (last 3)  Recent Labs    09/29/17 1645 09/29/17 2054 09/30/17 0624  GLUCAP 198* 150* 95    -sugars should continue to drop 11.  Neurogenic bowel/ bladder.    -needs am  Bowel program  -will add ditropan to allow bladder to fill for caths  -check ua/ucx also   12.  Hypothyroidism.  Synthroid 13.  Mild thrombocytopenia r- monitor 14.  Dysphagia due to thrush: nystatin with good results  LOS (Days) 9 A FACE TO FACE EVALUATION WAS PERFORMED  [name] 09/30/2017, 9:01 AM

## 2017-10-01 ENCOUNTER — Inpatient Hospital Stay (HOSPITAL_COMMUNITY): Payer: Medicare Other

## 2017-10-01 ENCOUNTER — Inpatient Hospital Stay (HOSPITAL_COMMUNITY): Payer: Medicare Other | Admitting: Physical Therapy

## 2017-10-01 ENCOUNTER — Inpatient Hospital Stay (HOSPITAL_COMMUNITY): Payer: Medicare Other | Admitting: Occupational Therapy

## 2017-10-01 LAB — GLUCOSE, CAPILLARY
GLUCOSE-CAPILLARY: 108 mg/dL — AB (ref 70–99)
Glucose-Capillary: 99 mg/dL (ref 70–99)
Glucose-Capillary: 163 mg/dL — ABNORMAL HIGH (ref 70–99)
Glucose-Capillary: 147 mg/dL — ABNORMAL HIGH (ref 70–99)

## 2017-10-01 MED ORDER — FUROSEMIDE 20 MG PO TABS
20.0000 mg | ORAL_TABLET | Freq: Every day | ORAL | Status: DC | PRN
  Administered 2017-10-01: 20 mg via ORAL
  Filled 2017-10-01: qty 1

## 2017-10-01 MED ORDER — NITROFURANTOIN MONOHYD MACRO 100 MG PO CAPS
100.0000 mg | ORAL_CAPSULE | Freq: Two times a day (BID) | ORAL | Status: DC
  Administered 2017-10-01 – 2017-10-06 (×11): 100 mg via ORAL
  Filled 2017-10-01 (×11): qty 1

## 2017-10-01 NOTE — Progress Notes (Addendum)
Social Work Patient ID: Sarah [name], female   DOB: 07-Mar-1966, 52 y.o.   MRN: [id number]  Have reviewed team conference with pt who is aware and agreeable with targeted d/c date of 7/25.  Understands goals range from mod ind to min assist w/c level and that it is not anticipated that she will have any ambulation goals for home.  Explained that team discussed best "set up" scenario for her to be safe when she is alone for a several hours qd and likely need for foley cath to make this successful.  She is in agreement with this if needed as she is very determined to be able to d/c home.  Pt remains motivated for therapies - will continue to follow for support and d/c planning needs.  Leane Loring, LCSW

## 2017-10-01 NOTE — Progress Notes (Signed)
Physical Therapy Session Note  Patient Details  Name: Sarah [name] MRN: [id number] Date of Birth: Mar 30, 1965  Today's Date: 10/01/2017 PT Individual Time: 1100-1200 PT Individual Time Calculation (min): 60 min   Short Term Goals: Week 2:  PT Short Term Goal 1 (Week 2): Pt will perform bed mobility with min A  PT Short Term Goal 2 (Week 2): Pt will perform car transfer with mod A +1 PT Short Term Goal 3 (Week 2): Pt will be able to verbalize and correctly demonstrate pressure relief in her w/c with mod I PT Short Term Goal 4 (Week 2): Pt will be able to appropriate place transfer board in preparation for lateral scoot transfers with supervision  Skilled Therapeutic Interventions/Progress Updates:  Pt supine in bed.  Use of RLE leg loops (R is too small due to edema due to fx) L leg lifter and min assist, extra time for problems solving to move bil LEs off of bed.  Min  Assist and HOB raised to come to sitting.    Pt directed care for slide board transfer.  Due to bed pad, pt needed assistance plaicing board correctly.  Mod assist and use of 4" high step stool to transfer.  W/c propulsion on level tile with modified independence.  Pt manipulated bil footrests with supervsion, one cue to lock brakes before leaning over, throughout session.  Therapeutic exercise performed with bil UEs to increase strength for functional mobility: UBE at level 6.5 x 5 minutes backwards, rated 13 on BORG scale.   Pt washed hands at sink in ADL kitchen with assistance for reaching soap, when postiioned next to counter, laterally.   Therapeutic activities in sitting in w/c, in ADL kitchen, scooting forward slightly on w/c seat (footrests removed by pt, with cue to lock brakes first) w/c facing counter,  in order to reach spices placed on back of counter top, with either hand. Pt opened refregerator from w/c and manipulated 3 items from shelves of refrigerator, transporting them in her lap to place on table without  difficulty.   Pt left resting in w/c with needs at hand, set up for lunch.    Therapy Documentation Precautions:  Precautions Precautions: Fall Precaution Comments: throracic region, LEs spasicity  Restrictions Weight Bearing Restrictions: Yes LLE Weight Bearing: Touchdown weight bearing Other Position/Activity Restrictions: LLE NWB   Pain: Pain Assessment Pain Score: 3, upper back, premedicated   See Function Navigator for Current Functional Status.   Therapy/Group: Individual Therapy  Skylene Deremer 10/01/2017, 12:19 PM

## 2017-10-01 NOTE — Progress Notes (Signed)
Occupational Therapy Session Note  Patient Details  Name: Sarah [name] MRN: [id number] Date of Birth: 1965-08-12  Today's Date: 10/01/2017 OT Individual Time: 2130-8657 OT Individual Time Calculation (min): 72 min    Short Term Goals: Week 2:  OT Short Term Goal 1 (Week 2): Pt will transfer to padded tub bench with mod A OT Short Term Goal 2 (Week 2): Pt will maintain dynamic sitting balance in prep for ADL with min A.  OT Short Term Goal 3 (Week 2): Pt will don LB clothing with mod A from bed level.  Skilled Therapeutic Interventions/Progress Updates:    Upon entering the room, pt supine in bed awaiting OT arrival with 5/10 c/o back pain and RN notified. Pt requesting bathing and dressing from bed level with set up A for bathing with pt utilizing bed functions to adjust bed as needed and use of LH sponge and reacher to increase I with LB self care. Pt donning UB clothing with set up A and supported with HOB elevated. Pt utilized reacher and was able to thread pants onto B feet and well as pull over hips with min A to roll with use of bed rails. OT attempted to assist pt with leg loops this session but they did not fit properly. Pt able to utilize leg lifter to pull B LEs off edge of bed. Pt needing support for trunk to come into full seated position. OT placed slide board with pt checking position and managing lateral lean with close supervision. Pt required max A for slide board transfer from bed >wheelchair. Pt seated in wheelchair at sink for grooming tasks at mod I level. Breakfast tray placed in front of pt as OT exited the room. Call bell and all needed items within reach upon exiting the room.   Therapy Documentation Precautions:  Precautions Precautions: Fall Precaution Comments: throracic region, LEs spasicity  Restrictions Weight Bearing Restrictions: Yes LLE Weight Bearing: Touchdown weight bearing Other Position/Activity Restrictions: LLE NWB General:   Vital Signs:    Pain: Pain Assessment Pain Scale: 0-10 Pain Score: 2  Pain Type: Chronic pain Pain Location: Back Pain Orientation: Mid;Upper Pain Descriptors / Indicators: Aching Pain Onset: With Activity Patients Stated Pain Goal: 0 Pain Intervention(s): Medication (See eMAR) ADL: ADL ADL Comments: see functional navigator  See Function Navigator for Current Functional Status.   Therapy/Group: Individual Therapy  [name] 10/01/2017, 9:43 AM

## 2017-10-01 NOTE — Progress Notes (Signed)
Subjective/Complaints:  Feeling fairly well today. Transfers improving.   ROS: Patient denies fever, rash, sore throat, blurred vision, nausea, vomiting, diarrhea, cough, shortness of breath or chest pain, joint or back pain, headache, or mood change.     Objective: Vital Signs: Blood pressure 126/75, pulse 78, temperature 98.5 F (36.9 C), temperature source Oral, resp. rate 18, height 5\' 11"  (1.803 m), weight 103.3 kg (227 lb 13.2 oz), last menstrual period 10/22/2016, SpO2 100 %. No results found. Results for orders placed or performed during the hospital encounter of 09/21/17 (from the past 72 hour(s))  Glucose, capillary     Status: Abnormal   Collection Time: 09/28/17 11:28 AM  Result Value Ref Range   Glucose-Capillary 129 (H) 70 - 99 mg/dL  Glucose, capillary     Status: Abnormal   Collection Time: 09/28/17  4:28 PM  Result Value Ref Range   Glucose-Capillary 171 (H) 70 - 99 mg/dL  Glucose, capillary     Status: Abnormal   Collection Time: 09/28/17  9:01 PM  Result Value Ref Range   Glucose-Capillary 181 (H) 70 - 99 mg/dL   Comment 1 Notify RN   Glucose, capillary     Status: Abnormal   Collection Time: 09/29/17  6:42 AM  Result Value Ref Range   Glucose-Capillary 123 (H) 70 - 99 mg/dL   Comment 1 Notify RN   Glucose, capillary     Status: Abnormal   Collection Time: 09/29/17 11:54 AM  Result Value Ref Range   Glucose-Capillary 103 (H) 70 - 99 mg/dL  Glucose, capillary     Status: Abnormal   Collection Time: 09/29/17  4:45 PM  Result Value Ref Range   Glucose-Capillary 198 (H) 70 - 99 mg/dL  Glucose, capillary     Status: Abnormal   Collection Time: 09/29/17  8:54 PM  Result Value Ref Range   Glucose-Capillary 150 (H) 70 - 99 mg/dL  Glucose, capillary     Status: None   Collection Time: 09/30/17  6:24 AM  Result Value Ref Range   Glucose-Capillary 95 70 - 99 mg/dL  Glucose, capillary     Status: Abnormal   Collection Time: 09/30/17 11:34 AM  Result Value  Ref Range   Glucose-Capillary 118 (H) 70 - 99 mg/dL  Urinalysis, Complete w Microscopic     Status: Abnormal   Collection Time: 09/30/17  3:28 PM  Result Value Ref Range   Color, Urine AMBER (A) YELLOW    Comment: BIOCHEMICALS MAY BE AFFECTED BY COLOR   APPearance CLOUDY (A) CLEAR   Specific Gravity, Urine 1.017 1.005 - 1.030   pH 5.0 5.0 - 8.0   Glucose, UA NEGATIVE NEGATIVE mg/dL   Hgb urine dipstick SMALL (A) NEGATIVE   Bilirubin Urine NEGATIVE NEGATIVE   Ketones, ur NEGATIVE NEGATIVE mg/dL   Protein, ur 30 (A) NEGATIVE mg/dL   Nitrite POSITIVE (A) NEGATIVE   Leukocytes, UA LARGE (A) NEGATIVE   RBC / HPF 0-5 0 - 5 RBC/hpf   WBC, UA >50 (H) 0 - 5 WBC/hpf   Bacteria, UA MANY (A) NONE SEEN   WBC Clumps PRESENT    Mucus PRESENT     Comment: Performed at [facility], [address] [place], [place] [address]  Glucose, capillary     Status: Abnormal   Collection Time: 09/30/17  4:58 PM  Result Value Ref Range   Glucose-Capillary 180 (H) 70 - 99 mg/dL  Glucose, capillary     Status: Abnormal  Collection Time: 09/30/17  9:20 PM  Result Value Ref Range   Glucose-Capillary 158 (H) 70 - 99 mg/dL  Glucose, capillary     Status: Abnormal   Collection Time: 10/01/17  6:34 AM  Result Value Ref Range   Glucose-Capillary 108 (H) 70 - 99 mg/dL   *Note: Due to a large number of results and/or encounters for the requested time period, some results have not been displayed. A complete set of results can be found in Results Review.     Constitutional: No distress . Vital signs reviewed. HEENT: EOMI, oral membranes moist Neck: supple Cardiovascular: RRR without murmur. No JVD    Respiratory: CTA Bilaterally without wheezes or rales. Normal effort    GI: BS +, non-tender, non-distended  Neuro: Alert/Oriented, Abnormal Sensory absent motor and sensory below T2 and Abnormal Motor 5/5 BUE, 0/5 BLE, mild extensor tone while in w/c Musc/Skel:  Other no pain with UE AROM, no pain with LE  PROM Psych: pleasant Skin: surgical wound remains intact   Assessment/Plan: 1. Functional deficits secondary to T3 paraplegia which require 3+ hours per day of interdisciplinary therapy in a comprehensive inpatient rehab setting. Physiatrist is providing close team supervision and 24 hour management of active medical problems listed below. Physiatrist and rehab team continue to assess barriers to discharge/monitor patient progress toward functional and medical goals. FIM: Function - Bathing Position: Bed Body parts bathed by patient: Right arm, Left arm, Chest, Abdomen, Right upper leg, Left upper leg, Right lower leg, Left lower leg Body parts bathed by helper: Back, Front perineal area, Buttocks Bathing not applicable: Front perineal area, Buttocks Assist Level: Touching or steadying assistance(Pt > 75%), Assistive device Assistive Device Comment: LH sponge  Function- Upper Body Dressing/Undressing What is the patient wearing?: Pull over shirt/dress, Bra Bra - Perfomed by patient: Thread/unthread right bra strap, Thread/unthread left bra strap Bra - Perfomed by helper: Hook/unhook bra (pull down sports bra) Pull over shirt/dress - Perfomed by patient: Thread/unthread right sleeve, Thread/unthread left sleeve, Put head through opening, Pull shirt over trunk Pull over shirt/dress - Perfomed by helper: Pull shirt over trunk Assist Level: Touching or steadying assistance(Pt > 75%) Set up : To obtain clothing/put away Function - Lower Body Dressing/Undressing What is the patient wearing?: Pants, Maryln Manuel, Shoes Position: Bed Pants- Performed by patient: Thread/unthread right pants leg, Thread/unthread left pants leg Pants- Performed by helper: Pull pants up/down Shoes - Performed by helper: Don/doff right shoe, Don/doff left shoe, Fasten right, Fasten left TED Hose - Performed by helper: Don/doff right TED hose, Don/doff left TED hose Assist for footwear: Dependant Assist for lower  body dressing: (mod A)  Function - Toileting Toileting activity did not occur: No continent bowel/bladder event Toileting steps completed by patient: Adjust clothing prior to toileting, Performs perineal hygiene, Adjust clothing after toileting(per Jobelle Mesias, NT) Toileting steps completed by helper: Adjust clothing prior to toileting, Performs perineal hygiene, Adjust clothing after toileting Assist level: Two helpers  Function - Air cabin crew transfer activity did not occur: Safety/medical concerns Assist level to toilet: 2 helpers(per Wyvonnia Lora, NT) Assist level from toilet: 2 helpers  Function - Chair/bed transfer Chair/bed transfer method: Lateral scoot Chair/bed transfer assist level: Moderate assist (Pt 50 - 74%/lift or lower) Chair/bed transfer assistive device: Armrests, Sliding board Chair/bed transfer details: Manual facilitation for placement, Manual facilitation for weight shifting, Verbal cues for safe use of DME/AE, Verbal cues for precautions/safety, Verbal cues for technique  Function - Locomotion: Wheelchair Will patient use  wheelchair at discharge?: Yes Type: Manual Max wheelchair distance: 400 Assist Level: Supervision or verbal cues Wheel 50 feet with 2 turns activity did not occur: Safety/medical concerns Assist Level: No help, No cues, assistive device, takes more than reasonable amount of time Wheel 150 feet activity did not occur: Safety/medical concerns Assist Level: Supervision or verbal cues Turns around,maneuvers to table,bed, and toilet,negotiates 3% grade,maneuvers on rugs and over doorsills: Yes(no threshold or rug) Function - Locomotion: Ambulation Ambulation activity did not occur: Safety/medical concerns Walk 10 feet activity did not occur: Safety/medical concerns Walk 50 feet with 2 turns activity did not occur: Safety/medical concerns Walk 150 feet activity did not occur: Safety/medical concerns Walk 10 feet on uneven surfaces  activity did not occur: Safety/medical concerns  Function - Comprehension Comprehension: Auditory Comprehension assist level: Understands basic 90% of the time/cues < 10% of the time  Function - Expression Expression: Verbal Expression assist level: Expresses complex ideas: With extra time/assistive device  Function - Social Interaction Social Interaction assist level: Interacts appropriately with others with medication or extra time (anti-anxiety, antidepressant).  Function - Problem Solving Problem solving assist level: Solves complex 90% of the time/cues < 10% of the time  Function - Memory Memory assist level: Complete Independence: No helper Patient normally able to recall (first 3 days only): Current season, Location of own room, Staff names and faces, That he or she is in a hospital   Medical Problem List and Plan: 1.  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 Team conf -no motor/sensory return LE's 2.  DVT Prophylaxis/Anticoagulation/history of pulmonary emboli: Xarelto.  Monitor for any bleeding episodes 3. Pain Management: Baclofen 20 mg nightly,  -  tramadol for more severe pain  -continue tylenol prn 4. Mood: Zoloft 25 mg daily.  Provide emotional support 5. Neuropsych: This patient is capable of making decisions on her own behalf. 6. Skin/Wound Care: Routine skin checks, surgical sites CDI 7. Fluids/Electrolytes/Nutrition: Routine in and outs with follow-up chemistries 8.  Left femoral shaft fracture.  Status post intramedullary retrograde nailing 09/11/2017.  Nonweightbearing. 9.  Acute blood loss anemia.  Follow-up CBC 7/2 , stable 10.  Hyperglycemia secondary to steroid.  SSI.  decreased decadron to 2mg  q12 on 7/8---> decr to 1mg  bid 7/10 CBG (last 3)  Recent Labs    09/30/17 1658 09/30/17 2120 10/01/17 0634  GLUCAP  180* 158* 108*    -sugars dropping 11.  Neurogenic bowel/ bladder.    -needs am  Bowel program  -added ditropan to allow bladder to fill for caths  -ua +, begin empiric macrobid  -ucx pending   12.  Hypothyroidism.  Synthroid 13.  Mild thrombocytopenia r- monitor 14.  Dysphagia due to thrush: nystatin with good results  LOS (Days) 10 A FACE TO FACE EVALUATION WAS PERFORMED  [name] 10/01/2017, 9:18 AM

## 2017-10-01 NOTE — Progress Notes (Signed)
Physical Therapy Weekly Progress Note  Patient Details  Name: Sarah [name] MRN: [id number] Date of Birth: Mar 12, 1966  Beginning of progress report period: September 22, 2017 End of progress report period: October 01, 2017  Today's Date: 10/01/2017 PT Individual Time: 0900-1000 PT Individual Time Calculation (min): 60 min   Patient has met 2 of 4 short term goals.  Pt continues to make positive improvements in therapy with progressions towards her long term and short term goals. She consistently performs lateral scoot transfers with mod A and bed mobility with mod A fading to max A when assistance is required for both pelvis and LE management. Pt has begun to work on placing her slide board in preparation for lateral scoot transfers with min A provided by therapist. PT will continue to implement uneven slide board transfer training to further progress patient towards her goal of performing car transfers with min A and practice with leg lifters to improve independence with functional tasks such as LE dressing.   Patient continues to demonstrate the following deficits muscle weakness, muscle joint tightness and muscle paralysis, abnormal tone and decreased sitting balance, decreased postural control and decreased balance strategies and therefore will continue to benefit from skilled PT intervention to increase functional independence with mobility.  Patient progressing toward long term goals..  Continue plan of care.  PT Short Term Goals Week 1:  PT Short Term Goal 1 (Week 1): Pt will perform bed mobility with min A  PT Short Term Goal 1 - Progress (Week 1): Progressing toward goal PT Short Term Goal 2 (Week 1): Pt will be able to position slide board and complete transfer with min A  PT Short Term Goal 2 - Progress (Week 1): Met PT Short Term Goal 3 (Week 1): Pt will propel w/c with SPV for >500 ft  PT Short Term Goal 3 - Progress (Week 1): Met PT Short Term Goal 4 (Week 1): Pt will perform car  transfer with mod A +1 PT Short Term Goal 4 - Progress (Week 1): Progressing toward goal Week 2:  PT Short Term Goal 1 (Week 2): Pt will perform bed mobility with min A  PT Short Term Goal 2 (Week 2): Pt will perform car transfer with mod A +1 PT Short Term Goal 3 (Week 2): Pt will be able to verbalize and correctly demonstrate pressure relief in her w/c with mod I PT Short Term Goal 4 (Week 2): Pt will be able to appropriate place transfer board in preparation for lateral scoot transfers with supervision  Skilled Therapeutic Interventions/Progress Updates:    Pt reports 3/10 back pain and recently received medication for reducing pain sx. PT initiated training with tilt table to increased weight bearing in an upright position for neuro muscular re education with block under R LE to maintain L LE NWB. Pt was transferred from w/c<>tilt table via hoyer lift. Pt was tilted to 30 degrees and BP assessed to be 135/81.Pt able to tolerate upright position set to 40 deg tilt with stable vitals for 30 minute duration. Pt propelled w/c<>treatment gym for 300 ft and returned to sitting with tray table and call bell in reach and all needs met.   Therapy Documentation Precautions:  Precautions Precautions: Fall Precaution Comments: throracic region, LEs spasicity  Restrictions Weight Bearing Restrictions: Yes LLE Weight Bearing: Touchdown weight bearing Other Position/Activity Restrictions: LLE NWB   See Function Navigator for Current Functional Status.  Therapy/Group: Individual Therapy  Floreen Comber 10/01/2017, 11:53 AM

## 2017-10-01 NOTE — Plan of Care (Signed)
  Problem: RH PAIN MANAGEMENT Goal: RH STG PAIN MANAGED AT OR BELOW PT'S PAIN GOAL Description Pain < or = 4 with min assistance  Outcome: Progressing Note:  Pain management discussed with pt.

## 2017-10-01 NOTE — Plan of Care (Signed)
  Problem: Consults Goal: RH SPINAL CORD INJURY PATIENT EDUCATION Description  See Patient Education module for education specifics.  Outcome: Progressing Goal: Skin Care Protocol Initiated - if Braden Score 18 or less Description If consults are not indicated, leave blank or document N/A Outcome: Progressing Goal: Nutrition Consult-if indicated Outcome: Progressing   Problem: SCI BOWEL ELIMINATION Goal: RH STG MANAGE BOWEL WITH ASSISTANCE Description STG Manage Bowel with mod Assistance.  Outcome: Progressing Goal: RH STG SCI MANAGE BOWEL WITH MEDICATION WITH ASSISTANCE Description STG SCI Manage bowel with medication with mod assistance.  Outcome: Progressing Goal: RH STG MANAGE BOWEL W/EQUIPMENT W/ASSISTANCE Description STG Manage Bowel With Equipment With mod Assistance  Outcome: Progressing Goal: RH STG SCI MANAGE BOWEL PROGRAM W/ASSIST OR AS APPROPRIATE Description STG SCI Manage bowel program w/assist mod   Outcome: Progressing   Problem: SCI BLADDER ELIMINATION Goal: RH STG MANAGE BLADDER WITH ASSISTANCE Description STG Manage Bladder With mod Assistance  Outcome: Progressing   Problem: RH SKIN INTEGRITY Goal: RH STG SKIN FREE OF INFECTION/BREAKDOWN Description Skin wound healing and no new skin breakdown with min assistance  Outcome: Progressing Goal: RH STG MAINTAIN SKIN INTEGRITY WITH ASSISTANCE Description STG Maintain Skin Integrity With min Assistance.  Outcome: Progressing Goal: RH STG ABLE TO PERFORM INCISION/WOUND CARE W/ASSISTANCE Description STG Able To Perform Incision/Wound Care With mod Assistance.  Outcome: Progressing   Problem: RH SAFETY Goal: RH STG ADHERE TO SAFETY PRECAUTIONS W/ASSISTANCE/DEVICE Description STG Adhere to Safety Precautions With min Assistance/Device.  Outcome: Progressing   Problem: RH PAIN MANAGEMENT Goal: RH STG PAIN MANAGED AT OR BELOW PT'S PAIN GOAL Description Pain < or = 4 with min assistance  Outcome:  Progressing   Problem: RH KNOWLEDGE DEFICIT SCI Goal: RH STG INCREASE KNOWLEDGE OF SELF CARE AFTER SCI Description Patient able to verbalize safe care activities with min assistance   Outcome: Progressing

## 2017-10-02 ENCOUNTER — Inpatient Hospital Stay (HOSPITAL_COMMUNITY): Payer: Medicare Other

## 2017-10-02 ENCOUNTER — Other Ambulatory Visit: Payer: Self-pay | Admitting: Hematology

## 2017-10-02 ENCOUNTER — Inpatient Hospital Stay (HOSPITAL_COMMUNITY): Payer: Medicare Other | Admitting: Physical Therapy

## 2017-10-02 LAB — URINE CULTURE: Culture: >=100000 — AB

## 2017-10-02 LAB — GLUCOSE, CAPILLARY: GLUCOSE-CAPILLARY: 116 mg/dL — AB (ref 70–99)

## 2017-10-02 MED ORDER — OXYBUTYNIN CHLORIDE 5 MG PO TABS
5.0000 mg | ORAL_TABLET | Freq: Three times a day (TID) | ORAL | Status: DC
  Administered 2017-10-02 – 2017-10-13 (×33): 5 mg via ORAL
  Filled 2017-10-02 (×35): qty 1

## 2017-10-02 MED ORDER — DEXAMETHASONE 0.5 MG PO TABS
0.5000 mg | ORAL_TABLET | Freq: Two times a day (BID) | ORAL | Status: AC
  Administered 2017-10-02 – 2017-10-04 (×5): 0.5 mg via ORAL
  Filled 2017-10-02 (×5): qty 1

## 2017-10-02 NOTE — Progress Notes (Signed)
Physical Therapy Session Note  Patient Details  Name: Sarah [name] MRN: [id number] Date of Birth: 05/01/1965  Today's Date: 10/02/2017 PT Individual Time: 1000-1130 PT Individual Time Calculation (min): 90 min   Short Term Goals: Week 2:  PT Short Term Goal 1 (Week 2): Pt will perform bed mobility with min A  PT Short Term Goal 2 (Week 2): Pt will perform car transfer with mod A +1 PT Short Term Goal 3 (Week 2): Pt will be able to verbalize and correctly demonstrate pressure relief in her w/c with mod I PT Short Term Goal 4 (Week 2): Pt will be able to appropriate place transfer board in preparation for lateral scoot transfers with supervision  Skilled Therapeutic Interventions/Progress Updates:    Pt reported no pain prior to treatment session onset. Pt performed lateral scoot transfer with slide board from w/c<>mat with therapist providing mod A for boost and board placement. Pt participated in w/c evaluation for lite weight w/c with push power assist. Pt propelled w/c >500 feet with B UE's for strengthening during performance of functional mobility task working on navigating distracting environments, obstacles, and narrow passages with Mod I and increased time. PT initiated UE strengthening for 4x/12 reps of seated w/c push ups, bicep curls, shoulder flexion, and tricep extension with 3# wt and rest breaks as needed. Pt was returned to room with tray table and call bell in reach and all needs met.   Therapy Documentation Precautions:  Precautions Precautions: Fall Precaution Comments: throracic region, LEs spasicity  Restrictions Weight Bearing Restrictions: Yes LLE Weight Bearing: Touchdown weight bearing Other Position/Activity Restrictions: LLE NWB   See Function Navigator for Current Functional Status.   Therapy/Group: Individual Therapy  Floreen Comber 10/02/2017, 2:20 PM

## 2017-10-02 NOTE — Progress Notes (Signed)
Physical Therapy Session Note  Patient Details  Name: Sarah [name] MRN: [id number] Date of Birth: 1965-10-24  Today's Date: 10/02/2017 PT Individual Time: 0800-0900 PT Individual Time Calculation (min): 60 min   Short Term Goals: Week 2:  PT Short Term Goal 1 (Week 2): Pt will perform bed mobility with min A  PT Short Term Goal 2 (Week 2): Pt will perform car transfer with mod A +1 PT Short Term Goal 3 (Week 2): Pt will be able to verbalize and correctly demonstrate pressure relief in her w/c with mod I PT Short Term Goal 4 (Week 2): Pt will be able to appropriate place transfer board in preparation for lateral scoot transfers with supervision  Skilled Therapeutic Interventions/Progress Updates:    Pt received semi-reclined in bed, agreeable to PT. Pt reports some back pain, not rated, receives pain medication from RN at beginning of therapy session. Assisted pt with donning TED hose and pants, rolling L/R with min A and use of bedrails to pull pants up over bottom. Supine to sit with max A with HOB elevated, use of leg lifter for BLE management. Sliding board transfer bed to w/c with mod A, 4" step under BLE. Pt performs sink-level ADLs (brushing teeth and hair, washing face) with setup assist. Manual w/c propulsion x 150 ft Mod I. Seated UBE x 5 min fwd x 5 min backward level 6.5 for global UE strengthening. Pt left seated in w/c in room with needs in reach at end of therapy session.  Therapy Documentation Precautions:  Precautions Precautions: Fall Precaution Comments: throracic region, LEs spasicity  Restrictions Weight Bearing Restrictions: Yes LLE Weight Bearing: Touchdown weight bearing Other Position/Activity Restrictions: LLE NWB  See Function Navigator for Current Functional Status.   Therapy/Group: Individual Therapy  Excell Seltzer, PT, DPT  10/02/2017, 9:01 AM

## 2017-10-02 NOTE — Progress Notes (Signed)
Occupational Therapy Session Note  Patient Details  Name: Sarah [name] MRN: [id number] Date of Birth: 14-Aug-1965  Today's Date: 10/02/2017 OT Group Time: 1500-1600 OT Group Time Calculation (min): 60 min    Short Term Goals: Week 2:  OT Short Term Goal 1 (Week 2): Pt will transfer to padded tub bench with mod A OT Short Term Goal 2 (Week 2): Pt will maintain dynamic sitting balance in prep for ADL with min A.  OT Short Term Goal 3 (Week 2): Pt will don LB clothing with mod A from bed level.  Skilled Therapeutic Interventions/Progress Updates:    Pt participates in skilled therapeutic tx group of making a pinata focusing on BUE use/strengthening, social participation, functional reach, sitting balance/postural control and fine motor coordination. Pt able to follow recipe to mix/measure ingredients to make paper mache paste without cuing. Pt uses BUE to fold and cut strips of tissue paper with scissors following pattern, dip in paper mache, and place paper on pinata base in mod-max range outside BOS. Pt cued to switch hands part way through session for even UE use.  Pt conversational throughout with other group members. Exited session with pt escorted back to room and set up with call bell in reach and exit alarm on.  Therapy Documentation Precautions:  Precautions Precautions: Fall Precaution Comments: throracic region, LEs spasicity  Restrictions Weight Bearing Restrictions: Yes LLE Weight Bearing: Touchdown weight bearing Other Position/Activity Restrictions: LLE NWB  See Function Navigator for Current Functional Status.   Therapy/Group: Group Therapy  Tonny Branch 10/02/2017, 4:23 PM

## 2017-10-02 NOTE — Progress Notes (Signed)
Subjective/Complaints:  No new complaints. Just got dressed. Voiding patterns unchanged. Pain seems controlled.   ROS: Patient denies fever, rash, sore throat, blurred vision, nausea, vomiting, diarrhea, cough, shortness of breath or chest pain, joint or back pain, headache, or mood change.       Objective: Vital Signs: Blood pressure 111/70, pulse 73, temperature 98.5 F (36.9 C), temperature source Oral, resp. rate 19, height 5\' 11"  (1.803 m), weight 103.3 kg (227 lb 13.2 oz), last menstrual period 10/22/2016, SpO2 100 %. No results found. Results for orders placed or performed during the hospital encounter of 09/21/17 (from the past 72 hour(s))  Glucose, capillary     Status: Abnormal   Collection Time: 09/29/17 11:54 AM  Result Value Ref Range   Glucose-Capillary 103 (H) 70 - 99 mg/dL  Glucose, capillary     Status: Abnormal   Collection Time: 09/29/17  4:45 PM  Result Value Ref Range   Glucose-Capillary 198 (H) 70 - 99 mg/dL  Glucose, capillary     Status: Abnormal   Collection Time: 09/29/17  8:54 PM  Result Value Ref Range   Glucose-Capillary 150 (H) 70 - 99 mg/dL  Glucose, capillary     Status: None   Collection Time: 09/30/17  6:24 AM  Result Value Ref Range   Glucose-Capillary 95 70 - 99 mg/dL  Glucose, capillary     Status: Abnormal   Collection Time: 09/30/17 11:34 AM  Result Value Ref Range   Glucose-Capillary 118 (H) 70 - 99 mg/dL  Urine Culture     Status: Abnormal   Collection Time: 09/30/17  2:55 PM  Result Value Ref Range   Specimen Description URINE, CATHETERIZED    Special Requests      NONE Performed at [facility], [address], [place],  [address]    Culture >=100,000 COLONIES/mL ESCHERICHIA COLI (A)    Report Status 10/02/2017 FINAL    Organism ID, Bacteria ESCHERICHIA COLI (A)       Susceptibility   Escherichia coli - MIC*    AMPICILLIN >=32 RESISTANT Resistant     CEFAZOLIN >=64 RESISTANT Resistant     CEFTRIAXONE 8  SENSITIVE Sensitive     CIPROFLOXACIN <=0.25 SENSITIVE Sensitive     GENTAMICIN >=16 RESISTANT Resistant     IMIPENEM <=0.25 SENSITIVE Sensitive     NITROFURANTOIN <=16 SENSITIVE Sensitive     TRIMETH/SULFA <=20 SENSITIVE Sensitive     AMPICILLIN/SULBACTAM >=32 RESISTANT Resistant     PIP/TAZO <=4 SENSITIVE Sensitive     Extended ESBL NEGATIVE Sensitive     * >=100,000 COLONIES/mL ESCHERICHIA COLI  Urinalysis, Complete w Microscopic     Status: Abnormal   Collection Time: 09/30/17  3:28 PM  Result Value Ref Range   Color, Urine AMBER (A) YELLOW    Comment: BIOCHEMICALS MAY BE AFFECTED BY COLOR   APPearance CLOUDY (A) CLEAR   Specific Gravity, Urine 1.017 1.005 - 1.030   pH 5.0 5.0 - 8.0   Glucose, UA NEGATIVE NEGATIVE mg/dL   Hgb urine dipstick SMALL (A) NEGATIVE   Bilirubin Urine NEGATIVE NEGATIVE   Ketones, ur NEGATIVE NEGATIVE mg/dL   Protein, ur 30 (A) NEGATIVE mg/dL   Nitrite POSITIVE (A) NEGATIVE   Leukocytes, UA LARGE (A) NEGATIVE   RBC / HPF 0-5 0 - 5 RBC/hpf   WBC, UA >50 (H) 0 - 5 WBC/hpf   Bacteria, UA MANY (A) NONE SEEN   WBC Clumps PRESENT    Mucus PRESENT  Comment: Performed at [facility], [place] [address], [place], [place] [address]  Glucose, capillary     Status: Abnormal   Collection Time: 09/30/17  4:58 PM  Result Value Ref Range   Glucose-Capillary 180 (H) 70 - 99 mg/dL  Glucose, capillary     Status: Abnormal   Collection Time: 09/30/17  9:20 PM  Result Value Ref Range   Glucose-Capillary 158 (H) 70 - 99 mg/dL  Glucose, capillary     Status: Abnormal   Collection Time: 10/01/17  6:34 AM  Result Value Ref Range   Glucose-Capillary 108 (H) 70 - 99 mg/dL  Glucose, capillary     Status: None   Collection Time: 10/01/17 12:20 PM  Result Value Ref Range   Glucose-Capillary 99 70 - 99 mg/dL  Glucose, capillary     Status: Abnormal   Collection Time: 10/01/17  4:36 PM  Result Value Ref Range   Glucose-Capillary 163 (H) 70 - 99 mg/dL   Comment 1  Notify RN   Glucose, capillary     Status: Abnormal   Collection Time: 10/01/17 10:26 PM  Result Value Ref Range   Glucose-Capillary 147 (H) 70 - 99 mg/dL  Glucose, capillary     Status: Abnormal   Collection Time: 10/02/17  6:31 AM  Result Value Ref Range   Glucose-Capillary 116 (H) 70 - 99 mg/dL   *Note: Due to a large number of results and/or encounters for the requested time period, some results have not been displayed. A complete set of results can be found in Results Review.     Constitutional: No distress . Vital signs reviewed. HEENT: EOMI, oral membranes moist Neck: supple Cardiovascular: RRR without murmur. No JVD    Respiratory: CTA Bilaterally without wheezes or rales. Normal effort    GI: BS +, non-tender, non-distended   Neuro: Alert/Oriented, Abnormal Sensory absent motor and sensory below T2 and Abnormal Motor 5/5 BUE, 0/5 BLE, mild extensor tone while in w/c---remains motor/sensory complete Musc/Skel:  Other no pain with UE AROM, no pain with LE PROM Psych: pleasant Skin: surgical wound intact   Assessment/Plan: 1. Functional deficits secondary to T3 paraplegia which require 3+ hours per day of interdisciplinary therapy in a comprehensive inpatient rehab setting. Physiatrist is providing close team supervision and 24 hour management of active medical problems listed below. Physiatrist and rehab team continue to assess barriers to discharge/monitor patient progress toward functional and medical goals. FIM: Function - Bathing Position: Bed Body parts bathed by patient: Right arm, Left arm, Chest, Abdomen, Right upper leg, Left upper leg, Right lower leg, Left lower leg Body parts bathed by helper: Back, Front perineal area, Buttocks Bathing not applicable: Front perineal area, Buttocks Assist Level: Touching or steadying assistance(Pt > 75%), Assistive device Assistive Device Comment: LH sponge  Function- Upper Body Dressing/Undressing What is the patient  wearing?: Pull over shirt/dress, Bra Bra - Perfomed by patient: Thread/unthread right bra strap, Thread/unthread left bra strap, Hook/unhook bra (pull down sports bra) Bra - Perfomed by helper: Hook/unhook bra (pull down sports bra) Pull over shirt/dress - Perfomed by patient: Thread/unthread right sleeve, Thread/unthread left sleeve, Put head through opening, Pull shirt over trunk Pull over shirt/dress - Perfomed by helper: Pull shirt over trunk Assist Level: Set up, Supervision or verbal cues Set up : To obtain clothing/put away Function - Lower Body Dressing/Undressing What is the patient wearing?: Pants, Ted Hose, Shoes Position: Bed Pants- Performed by patient: Thread/unthread right pants leg, Thread/unthread left pants leg, Pull  pants up/down Pants- Performed by helper: Pull pants up/down Shoes - Performed by helper: Don/doff right shoe, Don/doff left shoe, Fasten right, Fasten left TED Hose - Performed by helper: Don/doff right TED hose, Don/doff left TED hose Assist for footwear: Dependant Assist for lower body dressing: (minA)  Function - Toileting Toileting activity did not occur: No continent bowel/bladder event Toileting steps completed by patient: Adjust clothing prior to toileting, Performs perineal hygiene, Adjust clothing after toileting(per Jobelle Mesias, NT) Toileting steps completed by helper: Adjust clothing prior to toileting, Performs perineal hygiene, Adjust clothing after toileting Assist level: Two helpers  Function - Air cabin crew transfer activity did not occur: Safety/medical concerns Assist level to toilet: 2 helpers(per Wyvonnia Lora, NT) Assist level from toilet: 2 helpers  Function - Chair/bed transfer Chair/bed transfer method: Lateral scoot Chair/bed transfer assist level: Moderate assist (Pt 50 - 74%/lift or lower) Chair/bed transfer assistive device: Sliding board Chair/bed transfer details: Manual facilitation for placement, Manual  facilitation for weight shifting, Verbal cues for safe use of DME/AE, Verbal cues for precautions/safety, Verbal cues for technique  Function - Locomotion: Wheelchair Will patient use wheelchair at discharge?: Yes Type: Manual Max wheelchair distance: 150' Assist Level: No help, No cues, assistive device, takes more than reasonable amount of time Wheel 50 feet with 2 turns activity did not occur: Safety/medical concerns Assist Level: No help, No cues, assistive device, takes more than reasonable amount of time Wheel 150 feet activity did not occur: Safety/medical concerns Assist Level: No help, No cues, assistive device, takes more than reasonable amount of time Turns around,maneuvers to table,bed, and toilet,negotiates 3% grade,maneuvers on rugs and over doorsills: Yes Function - Locomotion: Ambulation Ambulation activity did not occur: Safety/medical concerns Walk 10 feet activity did not occur: Safety/medical concerns Walk 50 feet with 2 turns activity did not occur: Safety/medical concerns Walk 150 feet activity did not occur: Safety/medical concerns Walk 10 feet on uneven surfaces activity did not occur: Safety/medical concerns  Function - Comprehension Comprehension: Auditory Comprehension assist level: Follows basic conversation/direction with no assist  Function - Expression Expression: Verbal Expression assist level: Expresses complex ideas: With extra time/assistive device  Function - Social Interaction Social Interaction assist level: Interacts appropriately with others with medication or extra time (anti-anxiety, antidepressant).  Function - Problem Solving Problem solving assist level: Solves complex 90% of the time/cues < 10% of the time  Function - Memory Memory assist level: Complete Independence: No helper Patient normally able to recall (first 3 days only): Current season, Location of own room, Staff names and faces, That he or she is in a hospital   Medical  Problem List and Plan: 1.  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 Team conf -no motor/sensory return LE's 2.  DVT Prophylaxis/Anticoagulation/history of pulmonary emboli: Xarelto.  Monitor for any bleeding episodes 3. Pain Management: Baclofen 20 mg nightly,  -tramadol for more severe pain  -continue tylenol prn 4. Mood: Zoloft 25 mg daily.  Provide emotional support 5. Neuropsych: This patient is capable of making decisions on her own behalf. 6. Skin/Wound Care: Routine skin checks, surgical sites CDI 7. Fluids/Electrolytes/Nutrition: Routine in and outs with follow-up chemistries 8.  Left femoral shaft fracture.  Status post intramedullary retrograde nailing 09/11/2017.  Nonweightbearing. 9.  Acute blood loss anemia.  Follow-up CBC 7/2 , stable 10.  Hyperglycemia secondary to steroid.  SSI.  decreased decadron  to 2mg  q12 on 7/8---> decreased to 1mg  bid 7/10   -0.5mg  for 2 days then off.  CBG (last 3)  Recent Labs    10/01/17 1636 10/01/17 2226 10/02/17 0631  GLUCAP 163* 147* 116*    -sugars dropping 11.  Neurogenic bowel/ bladder.    -working towards am  Bowel program  -continued, frequent incontinent voids  -incr ditropan to TID  -100k Ecoli: macrobid initiated 7/11--->7 day course 12.  Hypothyroidism.  Synthroid 13.  Mild thrombocytopenia r- monitor 14.  Dysphagia due to thrush: resolved with nystatin  LOS (Days) 11 A FACE TO FACE EVALUATION WAS PERFORMED  [name] 10/02/2017, 9:12 AM

## 2017-10-02 NOTE — Progress Notes (Signed)
Talked to Reesa Chew PA-C and Hewitt Blade to f/u on how she is doing. Will hold Lenvima for now to heal from surgery and till UTI and other acute issues resolved Will setup f/u with me(Dr Irene Limbo) in clinic in 3 weeks with labs  .Brunetta Genera MD MS

## 2017-10-03 ENCOUNTER — Inpatient Hospital Stay (HOSPITAL_COMMUNITY): Payer: Medicare Other | Admitting: Occupational Therapy

## 2017-10-03 MED ORDER — NYSTATIN 100000 UNIT/ML MT SUSP
5.0000 mL | Freq: Four times a day (QID) | OROMUCOSAL | Status: DC
  Administered 2017-10-03 – 2017-10-09 (×27): 500000 [IU] via ORAL
  Filled 2017-10-03 (×27): qty 5

## 2017-10-03 MED ORDER — POLYETHYLENE GLYCOL 3350 17 G PO PACK
17.0000 g | PACK | Freq: Every day | ORAL | Status: DC
  Administered 2017-10-04 – 2017-10-09 (×6): 17 g via ORAL
  Filled 2017-10-03 (×7): qty 1

## 2017-10-03 NOTE — Progress Notes (Signed)
Subjective/Complaints:  Having to be cathed now for urine. Pain controlled  ROS: Patient denies fever, rash, sore throat, blurred vision, nausea, vomiting, diarrhea, cough, shortness of breath or chest pain, joint or back pain, headache, or mood change.   Objective: Vital Signs: Blood pressure 123/75, pulse 68, temperature 97.8 F (36.6 C), resp. rate 18, height 5\' 11"  (1.803 m), weight 103.3 kg (227 lb 13.2 oz), last menstrual period 10/22/2016, SpO2 100 %. No results found. Results for orders placed or performed during the hospital encounter of 09/21/17 (from the past 72 hour(s))  Glucose, capillary     Status: Abnormal   Collection Time: 09/30/17 11:34 AM  Result Value Ref Range   Glucose-Capillary 118 (H) 70 - 99 mg/dL  Urine Culture     Status: Abnormal   Collection Time: 09/30/17  2:55 PM  Result Value Ref Range   Specimen Description URINE, CATHETERIZED    Special Requests      NONE Performed at [facility] [address] [place], [place] [address]    Culture >=100,000 COLONIES/mL ESCHERICHIA COLI (A)    Report Status 10/02/2017 FINAL    Organism ID, Bacteria ESCHERICHIA COLI (A)       Susceptibility   Escherichia coli - MIC*    AMPICILLIN >=32 RESISTANT Resistant     CEFAZOLIN >=64 RESISTANT Resistant     CEFTRIAXONE 8 SENSITIVE Sensitive     CIPROFLOXACIN <=0.25 SENSITIVE Sensitive     GENTAMICIN >=16 RESISTANT Resistant     IMIPENEM <=0.25 SENSITIVE Sensitive     NITROFURANTOIN <=16 SENSITIVE Sensitive     TRIMETH/SULFA <=20 SENSITIVE Sensitive     AMPICILLIN/SULBACTAM >=32 RESISTANT Resistant     PIP/TAZO <=4 SENSITIVE Sensitive     Extended ESBL NEGATIVE Sensitive     * >=100,000 COLONIES/mL ESCHERICHIA COLI  Urinalysis, Complete w Microscopic     Status: Abnormal   Collection Time: 09/30/17  3:28 PM  Result Value Ref Range   Color, Urine AMBER (A) YELLOW    Comment: BIOCHEMICALS MAY BE AFFECTED BY COLOR   APPearance CLOUDY (A) CLEAR   Specific  Gravity, Urine 1.017 1.005 - 1.030   pH 5.0 5.0 - 8.0   Glucose, UA NEGATIVE NEGATIVE mg/dL   Hgb urine dipstick SMALL (A) NEGATIVE   Bilirubin Urine NEGATIVE NEGATIVE   Ketones, ur NEGATIVE NEGATIVE mg/dL   Protein, ur 30 (A) NEGATIVE mg/dL   Nitrite POSITIVE (A) NEGATIVE   Leukocytes, UA LARGE (A) NEGATIVE   RBC / HPF 0-5 0 - 5 RBC/hpf   WBC, UA >50 (H) 0 - 5 WBC/hpf   Bacteria, UA MANY (A) NONE SEEN   WBC Clumps PRESENT    Mucus PRESENT     Comment: Performed at [facility], [place] [address] [place], [place] [address]  Glucose, capillary     Status: Abnormal   Collection Time: 09/30/17  4:58 PM  Result Value Ref Range   Glucose-Capillary 180 (H) 70 - 99 mg/dL  Glucose, capillary     Status: Abnormal   Collection Time: 09/30/17  9:20 PM  Result Value Ref Range   Glucose-Capillary 158 (H) 70 - 99 mg/dL  Glucose, capillary     Status: Abnormal   Collection Time: 10/01/17  6:34 AM  Result Value Ref Range   Glucose-Capillary 108 (H) 70 - 99 mg/dL  Glucose, capillary     Status: None   Collection Time: 10/01/17 12:20 PM  Result Value Ref Range   Glucose-Capillary 99  70 - 99 mg/dL  Glucose, capillary     Status: Abnormal   Collection Time: 10/01/17  4:36 PM  Result Value Ref Range   Glucose-Capillary 163 (H) 70 - 99 mg/dL   Comment 1 Notify RN   Glucose, capillary     Status: Abnormal   Collection Time: 10/01/17 10:26 PM  Result Value Ref Range   Glucose-Capillary 147 (H) 70 - 99 mg/dL  Glucose, capillary     Status: Abnormal   Collection Time: 10/02/17  6:31 AM  Result Value Ref Range   Glucose-Capillary 116 (H) 70 - 99 mg/dL   *Note: Due to a large number of results and/or encounters for the requested time period, some results have not been displayed. A complete set of results can be found in Results Review.     Constitutional: No distress . Vital signs reviewed. HEENT: EOMI, oral membranes moist Neck: supple Cardiovascular: RRR without murmur. No JVD     Respiratory: CTA Bilaterally without wheezes or rales. Normal effort    GI: BS +, non-tender, non-distended    Neuro: Alert/Oriented, Abnormal Sensory absent motor and sensory below T2 and Abnormal Motor 5/5 BUE, 0/5 BLE, mild extensor tone while in w/c---no changes Musc/Skel:  Other no pain with UE AROM, no pain with LE PROM Psych: pleasant Skin: surgical wound intact   Assessment/Plan: 1. Functional deficits secondary to T3 paraplegia which require 3+ hours per day of interdisciplinary therapy in a comprehensive inpatient rehab setting. Physiatrist is providing close team supervision and 24 hour management of active medical problems listed below. Physiatrist and rehab team continue to assess barriers to discharge/monitor patient progress toward functional and medical goals. FIM: Function - Bathing Position: Bed Body parts bathed by patient: Right arm, Left arm, Chest, Abdomen, Right upper leg, Left upper leg, Right lower leg, Left lower leg Body parts bathed by helper: Back, Front perineal area, Buttocks Bathing not applicable: Front perineal area, Buttocks Assist Level: Touching or steadying assistance(Pt > 75%), Assistive device Assistive Device Comment: LH sponge  Function- Upper Body Dressing/Undressing What is the patient wearing?: Pull over shirt/dress, Bra Bra - Perfomed by patient: Thread/unthread right bra strap, Thread/unthread left bra strap, Hook/unhook bra (pull down sports bra) Bra - Perfomed by helper: Hook/unhook bra (pull down sports bra) Pull over shirt/dress - Perfomed by patient: Thread/unthread right sleeve, Thread/unthread left sleeve, Put head through opening, Pull shirt over trunk Pull over shirt/dress - Perfomed by helper: Pull shirt over trunk Assist Level: Set up, Supervision or verbal cues Set up : To obtain clothing/put away Function - Lower Body Dressing/Undressing What is the patient wearing?: Pants, Ted Hose, Shoes Position: Bed Pants- Performed by  patient: Thread/unthread right pants leg, Thread/unthread left pants leg, Pull pants up/down Pants- Performed by helper: Pull pants up/down Shoes - Performed by helper: Don/doff right shoe, Don/doff left shoe, Fasten right, Fasten left TED Hose - Performed by helper: Don/doff right TED hose, Don/doff left TED hose Assist for footwear: Dependant Assist for lower body dressing: (minA)  Function - Toileting Toileting activity did not occur: No continent bowel/bladder event Toileting steps completed by patient: Adjust clothing prior to toileting, Performs perineal hygiene, Adjust clothing after toileting(per Jobelle Mesias, NT) Toileting steps completed by helper: Adjust clothing prior to toileting, Performs perineal hygiene, Adjust clothing after toileting Assist level: Two helpers  Function - Air cabin crew transfer activity did not occur: Safety/medical concerns Assist level to toilet: 2 helpers(per Wyvonnia Lora, NT) Assist level from toilet: 2 helpers  Function -  Chair/bed transfer Chair/bed transfer method: Lateral scoot Chair/bed transfer assist level: Moderate assist (Pt 50 - 74%/lift or lower) Chair/bed transfer assistive device: Sliding board Chair/bed transfer details: Manual facilitation for placement, Manual facilitation for weight shifting, Verbal cues for safe use of DME/AE, Verbal cues for precautions/safety, Verbal cues for technique, Verbal cues for sequencing, Tactile cues for sequencing, Tactile cues for weight shifting  Function - Locomotion: Wheelchair Will patient use wheelchair at discharge?: Yes Type: Manual Max wheelchair distance: >500 Assist Level: No help, No cues, assistive device, takes more than reasonable amount of time Wheel 50 feet with 2 turns activity did not occur: Safety/medical concerns Assist Level: No help, No cues, assistive device, takes more than reasonable amount of time Wheel 150 feet activity did not occur: Safety/medical  concerns Assist Level: No help, No cues, assistive device, takes more than reasonable amount of time Turns around,maneuvers to table,bed, and toilet,negotiates 3% grade,maneuvers on rugs and over doorsills: Yes Function - Locomotion: Ambulation Ambulation activity did not occur: Safety/medical concerns Walk 10 feet activity did not occur: Safety/medical concerns Walk 50 feet with 2 turns activity did not occur: Safety/medical concerns Walk 150 feet activity did not occur: Safety/medical concerns Walk 10 feet on uneven surfaces activity did not occur: Safety/medical concerns  Function - Comprehension Comprehension: Auditory Comprehension assist level: Follows basic conversation/direction with no assist  Function - Expression Expression: Verbal Expression assist level: Expresses complex ideas: With extra time/assistive device  Function - Social Interaction Social Interaction assist level: Interacts appropriately with others with medication or extra time (anti-anxiety, antidepressant).  Function - Problem Solving Problem solving assist level: Solves complex 90% of the time/cues < 10% of the time  Function - Memory Memory assist level: Complete Independence: No helper Patient normally able to recall (first 3 days only): Current season, Location of own room, Staff names and faces, That he or she is in a hospital   Medical Problem List and Plan: 1.  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     2.  DVT Prophylaxis/Anticoagulation/history of pulmonary emboli: Xarelto.  Monitor for any bleeding episodes 3. Pain Management: Baclofen 20 mg nightly,  -tramadol for more severe pain  -continue tylenol prn 4. Mood: Zoloft 25 mg daily.  Provide emotional support 5. Neuropsych: This patient is capable of making decisions on her own behalf. 6.  Skin/Wound Care: Routine skin checks, surgical sites CDI 7. Fluids/Electrolytes/Nutrition: Routine in and outs with follow-up chemistries 8.  Left femoral shaft fracture.  Status post intramedullary retrograde nailing 09/11/2017.  Nonweightbearing. 9.  Acute blood loss anemia.  Follow-up CBC 7/2 , stable 10.  Hyperglycemia secondary to steroid.  SSI.  decreased decadron to 2mg  q12 on 7/8---> decreased to 1mg  bid 7/10   -0.5mg  for 2 days then off.  CBG (last 3)  Recent Labs    10/01/17 1636 10/01/17 2226 10/02/17 0631  GLUCAP 163* 147* 116*    -sugars dropping with steroid taper 11.  Neurogenic bowel/ bladder.    -pt prefers PM bowel program  -incontinent now with some residual  -increased ditropan to TID  -100k Ecoli: macrobid initiated 7/11--->7 day course 12.  Hypothyroidism.  Synthroid 13.  Mild thrombocytopenia r- monitor 14.  Dysphagia due to thrush: resolved with nystatin  LOS (Days) 12 A FACE TO FACE EVALUATION WAS PERFORMED  [name] 10/03/2017, 8:49 AM

## 2017-10-03 NOTE — Progress Notes (Signed)
Patient complained or mouth and throat pain everytime she swallows. Patient on antibiotic. MD notified new orders noted.

## 2017-10-03 NOTE — Plan of Care (Signed)
  Problem: SCI BLADDER ELIMINATION Goal: RH STG MANAGE BLADDER WITH ASSISTANCE Description STG Manage Bladder With mod Assistance  Outcome: Not Progressing; in and out cath q4-6 Problem: SCI BLADDER ELIMINATION Goal: RH STG MANAGE BLADDER WITH ASSISTANCE Description STG Manage Bladder With mod Assistance  Outcome: Not Progressing;  Problem: SCI BOWEL ELIMINATION Goal: RH STG MANAGE BOWEL WITH ASSISTANCE Description STG Manage Bowel with mod Assistance.  Outcome: Not Progressing; bowel program       Problem: RH SKIN INTEGRITY Goal: RH STG SKIN FREE OF INFECTION/BREAKDOWN Description Skin wound healing and no new skin breakdown with min assistance  Outcome: Progressing

## 2017-10-03 NOTE — Progress Notes (Signed)
Occupational Therapy Session Note  Patient Details  Name: Sarah [name] MRN: [id number] Date of Birth: [birth year]  Today's Date: 10/04/2017 OT Group Time: 1300-1400 OT Group Time Calculation (min): 60 min   Skilled Therapeutic Interventions/Progress Updates:    Pt participated in therapeutic w/c level dance group with focus on UE/LE strengthening, activity tolerance, and social participation for carryover during self care tasks. Pt was guided through various dance-based exercises involving UB/LB and trunk. She used leg lifter to incorporate LEs when dancing as needed. Pt actively engaged throughout session, smiling, and interacting with other group members. At end of tx she was taken back to room by RN.   Therapy Documentation Precautions:  Precautions Precautions: Fall Precaution Comments: throracic region, LEs spasicity  Restrictions Weight Bearing Restrictions: Yes LLE Weight Bearing: Touchdown weight bearing Other Position/Activity Restrictions: LLE NWB Vital Signs: Therapy Vitals Temp: 99.2 F (37.3 C) Temp Source: Oral Pulse Rate: 79 Resp: 18 BP: (!) 144/88 Patient Position (if appropriate): Lying Oxygen Therapy SpO2: 100 % O2 Device: Room Air ADL: ADL ADL Comments: see functional navigator    See Function Navigator for Current Functional Status.   Therapy/Group: Group Therapy  Dorlisa Savino A Brienne Liguori 10/04/2017, 4:22 PM

## 2017-10-04 ENCOUNTER — Inpatient Hospital Stay (HOSPITAL_COMMUNITY): Payer: Medicare Other

## 2017-10-04 ENCOUNTER — Inpatient Hospital Stay (HOSPITAL_COMMUNITY): Payer: Medicare Other | Admitting: Occupational Therapy

## 2017-10-04 NOTE — Progress Notes (Signed)
Occupational Therapy Session Note  Patient Details  Name: Sarah [name] MRN: [id number] Date of Birth: Oct 05, 1965  Today's Date: 10/04/2017 OT Individual Time: 2800-3491 OT Individual Time Calculation (min): 43 min    Skilled Therapeutic Interventions/Progress Updates:    1;1. OT dons shoes. Pt uses leg lifter to manage BLE off EOB with supervision and VC for technique and A for trunk elevation into sitting. Pt slide board transfer with MOD A placing  And VC for head hips relationship. Pt propels w/c to gift shop and uses reacher to grab items with VC to lock w/c brakes prior to reaching forward. Pt completes 2x10 lateral trunk flexion to tap elbow onto mat from seated in w/c to improve oblique strength required for trunk control/sitting balance. Exited session with pt seated in w/c, call light in reach and all needs met  Therapy Documentation Precautions:  Precautions Precautions: Fall Precaution Comments: throracic region, LEs spasicity  Restrictions Weight Bearing Restrictions: Yes LLE Weight Bearing: Touchdown weight bearing Other Position/Activity Restrictions: LLE NWB General:   Vital Signs: Therapy Vitals Temp: 99.2 F (37.3 C) Temp Source: Oral Pulse Rate: 79 Resp: 18 BP: (!) 144/88 Patient Position (if appropriate): Lying Oxygen Therapy SpO2: 100 % O2 Device: Room Air  See Function Navigator for Current Functional Status.   Therapy/Group: Individual Therapy  Tonny Branch 10/04/2017, 4:47 PM

## 2017-10-04 NOTE — Progress Notes (Signed)
Subjective/Complaints:  No new complaints.  Did notice some thrush in her mouth and restarted nystatin yesterday.  ROS: Patient denies fever, rash, sore throat, blurred vision, nausea, vomiting, diarrhea, cough, shortness of breath or chest pain, joint or back pain, headache, or mood change. .   Objective: Vital Signs: Blood pressure 123/77, pulse 68, temperature 97.6 F (36.4 C), resp. rate 17, height 5\' 11"  (1.803 m), weight 103.3 kg (227 lb 13.2 oz), last menstrual period 10/22/2016, SpO2 100 %. No results found. Results for orders placed or performed during the hospital encounter of 09/21/17 (from the past 72 hour(s))  Glucose, capillary     Status: None   Collection Time: 10/01/17 12:20 PM  Result Value Ref Range   Glucose-Capillary 99 70 - 99 mg/dL  Glucose, capillary     Status: Abnormal   Collection Time: 10/01/17  4:36 PM  Result Value Ref Range   Glucose-Capillary 163 (H) 70 - 99 mg/dL   Comment 1 Notify RN   Glucose, capillary     Status: Abnormal   Collection Time: 10/01/17 10:26 PM  Result Value Ref Range   Glucose-Capillary 147 (H) 70 - 99 mg/dL  Glucose, capillary     Status: Abnormal   Collection Time: 10/02/17  6:31 AM  Result Value Ref Range   Glucose-Capillary 116 (H) 70 - 99 mg/dL   *Note: Due to a large number of results and/or encounters for the requested time period, some results have not been displayed. A complete set of results can be found in Results Review.     Constitutional: No distress . Vital signs reviewed. HEENT: EOMI, oral membranes moist Neck: supple Cardiovascular: RRR without murmur. No JVD    Respiratory: CTA Bilaterally without wheezes or rales. Normal effort    GI: BS +, non-tender, non-distended    Neuro: Alert/Oriented, Abnormal Sensory absent motor and sensory below T2 and Abnormal Motor 5/5 BUE, 0/5 BLE, mild extensor tone while in w/c--neuro exam unchanged Musc/Skel:  Other no pain with UE AROM, no pain with LE PROM Psych:  pleasant Skin: surgical wound intact   Assessment/Plan: 1. Functional deficits secondary to T3 paraplegia which require 3+ hours per day of interdisciplinary therapy in a comprehensive inpatient rehab setting. Physiatrist is providing close team supervision and 24 hour management of active medical problems listed below. Physiatrist and rehab team continue to assess barriers to discharge/monitor patient progress toward functional and medical goals. FIM: Function - Bathing Position: Bed Body parts bathed by patient: Right arm, Left arm, Chest, Abdomen, Right upper leg, Left upper leg, Right lower leg, Left lower leg Body parts bathed by helper: Back, Front perineal area, Buttocks Bathing not applicable: Front perineal area, Buttocks Assist Level: Touching or steadying assistance(Pt > 75%), Assistive device Assistive Device Comment: LH sponge  Function- Upper Body Dressing/Undressing What is the patient wearing?: Pull over shirt/dress, Bra Bra - Perfomed by patient: Thread/unthread right bra strap, Thread/unthread left bra strap, Hook/unhook bra (pull down sports bra) Bra - Perfomed by helper: Hook/unhook bra (pull down sports bra) Pull over shirt/dress - Perfomed by patient: Thread/unthread right sleeve, Thread/unthread left sleeve, Put head through opening, Pull shirt over trunk Pull over shirt/dress - Perfomed by helper: Pull shirt over trunk Assist Level: Set up, Supervision or verbal cues Set up : To obtain clothing/put away Function - Lower Body Dressing/Undressing What is the patient wearing?: Pants, Ted Hose, Shoes Position: Bed Pants- Performed by patient: Thread/unthread right pants leg, Thread/unthread left pants leg, Pull pants up/down Pants-  Performed by helper: Pull pants up/down Shoes - Performed by helper: Don/doff right shoe, Don/doff left shoe, Fasten right, Fasten left TED Hose - Performed by helper: Don/doff right TED hose, Don/doff left TED hose Assist for footwear:  Dependant Assist for lower body dressing: (minA)  Function - Toileting Toileting activity did not occur: No continent bowel/bladder event Toileting steps completed by patient: Adjust clothing prior to toileting, Performs perineal hygiene, Adjust clothing after toileting(per Jobelle Mesias, NT) Toileting steps completed by helper: Adjust clothing after toileting Assist level: Two helpers  Function - Air cabin crew transfer activity did not occur: Safety/medical concerns Assist level to toilet: 2 helpers(per Wyvonnia Lora, NT) Assist level from toilet: 2 helpers  Function - Chair/bed transfer Chair/bed transfer method: Lateral scoot Chair/bed transfer assist level: Moderate assist (Pt 50 - 74%/lift or lower) Chair/bed transfer assistive device: Sliding board Chair/bed transfer details: Manual facilitation for placement, Manual facilitation for weight shifting, Verbal cues for safe use of DME/AE, Verbal cues for precautions/safety, Verbal cues for technique, Verbal cues for sequencing, Tactile cues for sequencing, Tactile cues for weight shifting  Function - Locomotion: Wheelchair Will patient use wheelchair at discharge?: Yes Type: Manual Max wheelchair distance: >500 Assist Level: No help, No cues, assistive device, takes more than reasonable amount of time Wheel 50 feet with 2 turns activity did not occur: Safety/medical concerns Assist Level: No help, No cues, assistive device, takes more than reasonable amount of time Wheel 150 feet activity did not occur: Safety/medical concerns Assist Level: No help, No cues, assistive device, takes more than reasonable amount of time Turns around,maneuvers to table,bed, and toilet,negotiates 3% grade,maneuvers on rugs and over doorsills: Yes Function - Locomotion: Ambulation Ambulation activity did not occur: Safety/medical concerns Walk 10 feet activity did not occur: Safety/medical concerns Walk 50 feet with 2 turns activity did not  occur: Safety/medical concerns Walk 150 feet activity did not occur: Safety/medical concerns Walk 10 feet on uneven surfaces activity did not occur: Safety/medical concerns  Function - Comprehension Comprehension: Auditory Comprehension assist level: Understands complex 90% of the time/cues 10% of the time  Function - Expression Expression: Verbal Expression assist level: Expresses complex ideas: With extra time/assistive device  Function - Social Interaction Social Interaction assist level: Interacts appropriately with others with medication or extra time (anti-anxiety, antidepressant).  Function - Problem Solving Problem solving assist level: Solves complex 90% of the time/cues < 10% of the time  Function - Memory Memory assist level: Complete Independence: No helper Patient normally able to recall (first 3 days only): Current season, That he or she is in a hospital   Medical Problem List and Plan: 1.  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     2.  DVT Prophylaxis/Anticoagulation/history of pulmonary emboli: Xarelto.  Monitor for any bleeding episodes 3. Pain Management: Baclofen 20 mg nightly,  -tramadol for more severe pain  -continue tylenol prn 4. Mood: Zoloft 25 mg daily.  Provide emotional support 5. Neuropsych: This patient is capable of making decisions on her own behalf. 6. Skin/Wound Care: Routine skin checks, surgical sites CDI 7. Fluids/Electrolytes/Nutrition: Routine in and outs with follow-up chemistries 8.  Left femoral shaft fracture.  Status post intramedullary retrograde nailing 09/11/2017.  Nonweightbearing. 9.  Acute blood loss anemia.  Follow-up CBC 7/2 , stable 10.  Hyperglycemia secondary to steroid.  SSI.  Decadron tapering to off CBG (last  3)  Recent Labs    10/01/17 1636 10/01/17 2226 10/02/17 0631   GLUCAP 163* 147* 116*    -sugars dropping with steroid taper 11.  Neurogenic bowel/ bladder.    -  PM bowel program  -incontinent now with some residual  -increased ditropan to TID  -100k Ecoli: macrobid initiated 7/11--->7 day course  Cath volumes are larger now, still having incontinence-however.  May need to perform caths more frequently during the day 12.  Hypothyroidism.  Synthroid 13.  Mild thrombocytopenia - monitor 14.  Dysphagia due to thrush:   nystatin  LOS (Days) 13 A FACE TO FACE EVALUATION WAS PERFORMED  [name] 10/04/2017, 8:32 AM

## 2017-10-05 ENCOUNTER — Inpatient Hospital Stay (HOSPITAL_COMMUNITY): Payer: Medicare Other | Admitting: Occupational Therapy

## 2017-10-05 ENCOUNTER — Inpatient Hospital Stay (HOSPITAL_COMMUNITY): Payer: Medicare Other | Admitting: Physical Therapy

## 2017-10-05 ENCOUNTER — Inpatient Hospital Stay (HOSPITAL_COMMUNITY): Payer: Medicare Other

## 2017-10-05 NOTE — Progress Notes (Signed)
Physical Therapy Session Note  Patient Details  Name: Sarah [name] MRN: [id number] Date of Birth: 10-05-1965  Today's Date: 10/05/2017 PT Individual Time: 6578-4696 PT Individual Time Calculation (min): 57 min   Short Term Goals: Week 2:  PT Short Term Goal 1 (Week 2): Pt will perform bed mobility with min A  PT Short Term Goal 2 (Week 2): Pt will perform car transfer with mod A +1 PT Short Term Goal 3 (Week 2): Pt will be able to verbalize and correctly demonstrate pressure relief in her w/c with mod I PT Short Term Goal 4 (Week 2): Pt will be able to appropriate place transfer board in preparation for lateral scoot transfers with supervision  Skilled Therapeutic Interventions/Progress Updates:    Pt seated in w/c upon PT arrival, agreeable to therapy tx and denies pain. Pt propelled w/c from room>gym x 150 ft. Pt performed slideboard transfer from w/c>mat with emphasis on anterior weightshift and head hips relationship. Once on the mat pt worked on reciprocal scooting in order to scoot anterior and posterior on the mat, mod assist from therapist. Pt also performed reciprocal scooting anterior<>posterior on mat with lateral leans on elbow for weightshifting, min assist. Pt transferred mat<>w/c with slideboard and mod assist in each direction, emphasis on weightshifting and techniques. Pt seated edge of mat while therapist donned leg loops. Pt performed bed mobility with emphasis on use of leg loops for LE management, pt still requiring min-mod assist to lift each LE. Pt transferred from supine>propped on elbow>long sitting with max assist for trunk control, pt then transferred to ring sitting and able to maintain x 3 minutes. Pt transferred from ring sitting>sitting edge of mat with use of leg loops for LE management to bring LE's off mat with mod assist. Pt transferred back to w/c with mod assist and slideboard. Pt propelled back to room and left seated with needs in reach.   Therapy  Documentation Precautions:  Precautions Precautions: Fall Precaution Comments: throracic region, LEs spasicity  Restrictions Weight Bearing Restrictions: Yes LLE Weight Bearing: Touchdown weight bearing Other Position/Activity Restrictions: LLE NWB   See Function Navigator for Current Functional Status.   Therapy/Group: Individual Therapy  Netta Corrigan, PT, DPT 10/05/2017, 4:22 PM

## 2017-10-05 NOTE — Progress Notes (Signed)
Subjective/Complaints:  No new issues. Less incontinence with more frequent caths. Denies pain. Had bm x 2 last night  ROS: Patient denies fever, rash, sore throat, blurred vision, nausea, vomiting, diarrhea, cough, shortness of breath or chest pain, joint or back pain, headache, or mood change.  .   Objective: Vital Signs: Blood pressure 122/79, pulse 79, temperature 98.2 F (36.8 C), temperature source Oral, resp. rate 18, height 5\' 11"  (1.803 m), weight 103.3 kg (227 lb 13.2 oz), last menstrual period 10/22/2016, SpO2 100 %. No results found. No results found. However, due to the size of the patient record, not all encounters were searched. Please check Results Review for a complete set of results.   Constitutional: No distress . Vital signs reviewed. HEENT: EOMI, oral membranes moist Neck: supple Cardiovascular: RRR without murmur. No JVD    Respiratory: CTA Bilaterally without wheezes or rales. Normal effort    GI: BS +, non-tender, non-distended  Neuro: Alert/Oriented, Abnormal Sensory absent motor and sensory below T2 and Abnormal Motor 5/5 BUE, 0/5 BLE, mild extensor tone while in w/c--neuro exam unchanged Musc/Skel:  Other no pain with UE AROM, no pain with LE PROM Psych: pleasant Skin: surgical wound intact   Assessment/Plan: 1. Functional deficits secondary to T3 paraplegia which require 3+ hours per day of interdisciplinary therapy in a comprehensive inpatient rehab setting. Physiatrist is providing close team supervision and 24 hour management of active medical problems listed below. Physiatrist and rehab team continue to assess barriers to discharge/monitor patient progress toward functional and medical goals. FIM: Function - Bathing Position: Bed Body parts bathed by patient: Right arm, Left arm, Chest, Abdomen, Right upper leg, Left upper leg, Right lower leg, Left lower leg Body parts bathed by helper: Back, Front perineal area, Buttocks Bathing not applicable:  Front perineal area, Buttocks Assist Level: Touching or steadying assistance(Pt > 75%), Assistive device Assistive Device Comment: LH sponge  Function- Upper Body Dressing/Undressing What is the patient wearing?: Pull over shirt/dress, Bra Bra - Perfomed by patient: Thread/unthread right bra strap, Thread/unthread left bra strap, Hook/unhook bra (pull down sports bra) Bra - Perfomed by helper: Hook/unhook bra (pull down sports bra) Pull over shirt/dress - Perfomed by patient: Thread/unthread right sleeve, Thread/unthread left sleeve, Put head through opening, Pull shirt over trunk Pull over shirt/dress - Perfomed by helper: Pull shirt over trunk Assist Level: Set up, Supervision or verbal cues Set up : To obtain clothing/put away Function - Lower Body Dressing/Undressing What is the patient wearing?: Pants, Ted Hose, Shoes Position: Bed Pants- Performed by patient: Thread/unthread right pants leg, Thread/unthread left pants leg, Pull pants up/down Pants- Performed by helper: Pull pants up/down Shoes - Performed by helper: Don/doff right shoe, Don/doff left shoe, Fasten right, Fasten left TED Hose - Performed by helper: Don/doff right TED hose, Don/doff left TED hose Assist for footwear: Dependant Assist for lower body dressing: (minA)  Function - Toileting Toileting activity did not occur: No continent bowel/bladder event Toileting steps completed by patient: Adjust clothing prior to toileting, Performs perineal hygiene, Adjust clothing after toileting(per Jobelle Mesias, NT) Toileting steps completed by helper: Adjust clothing after toileting Assist level: Two helpers  Function - Air cabin crew transfer activity did not occur: Safety/medical concerns Assist level to toilet: 2 helpers(per Wyvonnia Lora, NT) Assist level from toilet: 2 helpers  Function - Chair/bed transfer Chair/bed transfer method: Lateral scoot Chair/bed transfer assist level: Moderate assist (Pt 50 -  74%/lift or lower) Chair/bed transfer assistive device: Sliding board Chair/bed transfer  details: Manual facilitation for placement, Manual facilitation for weight shifting, Verbal cues for safe use of DME/AE, Verbal cues for precautions/safety, Verbal cues for technique, Verbal cues for sequencing, Tactile cues for sequencing, Tactile cues for weight shifting  Function - Locomotion: Wheelchair Will patient use wheelchair at discharge?: Yes Type: Manual Max wheelchair distance: >500 Assist Level: No help, No cues, assistive device, takes more than reasonable amount of time Wheel 50 feet with 2 turns activity did not occur: Safety/medical concerns Assist Level: No help, No cues, assistive device, takes more than reasonable amount of time Wheel 150 feet activity did not occur: Safety/medical concerns Assist Level: No help, No cues, assistive device, takes more than reasonable amount of time Turns around,maneuvers to table,bed, and toilet,negotiates 3% grade,maneuvers on rugs and over doorsills: Yes Function - Locomotion: Ambulation Ambulation activity did not occur: Safety/medical concerns Walk 10 feet activity did not occur: Safety/medical concerns Walk 50 feet with 2 turns activity did not occur: Safety/medical concerns Walk 150 feet activity did not occur: Safety/medical concerns Walk 10 feet on uneven surfaces activity did not occur: Safety/medical concerns  Function - Comprehension Comprehension: Auditory Comprehension assist level: Understands complex 90% of the time/cues 10% of the time  Function - Expression Expression: Verbal Expression assist level: Expresses complex ideas: With extra time/assistive device  Function - Social Interaction Social Interaction assist level: Interacts appropriately with others with medication or extra time (anti-anxiety, antidepressant).  Function - Problem Solving Problem solving assist level: Solves complex 90% of the time/cues < 10% of the  time  Function - Memory Memory assist level: Complete Independence: No helper Patient normally able to recall (first 3 days only): Current season, That he or she is in a hospital   Medical Problem List and Plan: 1.  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     2.  DVT Prophylaxis/Anticoagulation/history of pulmonary emboli: Xarelto.  Monitor for any bleeding episodes 3. Pain Management: Baclofen 20 mg nightly,  -tramadol for more severe pain  -continue tylenol prn 4. Mood: Zoloft 25 mg daily.  Provide emotional support 5. Neuropsych: This patient is capable of making decisions on her own behalf. 6. Skin/Wound Care: Routine skin checks, surgical sites CDI 7. Fluids/Electrolytes/Nutrition: check labs tomorrow 8.  Left femoral shaft fracture.  Status post intramedullary retrograde nailing 09/11/2017.  Nonweightbearing. 9.  Acute blood loss anemia.  Follow-up CBC 7/2 , stable 10.  Hyperglycemia secondary to steroid.  improved  Decadron tapered off CBG (last 3)  No results for input(s): GLUCAP in the last 72 hours.      -dc'ed cbg checks and SSI 11.  Neurogenic bowel/ bladder.    -  PM bowel program  -incontinent now with some residual  -increased ditropan to TID  -100k Ecoli: macrobid initiated 7/11--->7 day course  -targeting a cath schedule which keeps her dry during the day 12.  Hypothyroidism.  Synthroid 13.  Mild thrombocytopenia - monitor 14.  Dysphagia due to thrush:   nystatin  LOS (Days) 14 A FACE TO FACE EVALUATION WAS PERFORMED  [name] 10/05/2017, 8:56 AM

## 2017-10-05 NOTE — Progress Notes (Signed)
Physical Therapy Session Note  Patient Details  Name: Sarah [name] MRN: [id number] Date of Birth: [birth year]  Today's Date: 10/05/2017 PT Individual Time: 1300-1400 PT Individual Time Calculation (min): 60 min   Short Term Goals: Week 2:  PT Short Term Goal 1 (Week 2): Pt will perform bed mobility with min A  PT Short Term Goal 2 (Week 2): Pt will perform car transfer with mod A +1 PT Short Term Goal 3 (Week 2): Pt will be able to verbalize and correctly demonstrate pressure relief in her w/c with mod I PT Short Term Goal 4 (Week 2): Pt will be able to appropriate place transfer board in preparation for lateral scoot transfers with supervision  Skilled Therapeutic Interventions/Progress Updates:    Pt reported no pain prior to treatment session onset. Pt performed supine> sitting EOB with max A at pelvis and assisting patient with LE's using leg lifter with HOB elevated and heavy UE reliance on bed rails. PT initatied lateral scoot transfer with slide board with bed elevated > w/c for inc efficiency with transfer. Therapist provided min A for placement of board and mod A for boosting during slide board transfer. Pt is able to posteriorly scoot in chair for proper positioning with min A and multimodal cues for maintaining head hip relationship and alternating between shifting each LE. Pt propelled w/c> treatment gym for 150 feet and PT initiated pressure relief training with pressure mapping device for visual feedback. PT recognized Pt was placing too much pressure to distal femur and adjusted height of B foot plates to alleviate pressure. PT placed towel rolled up behind Pt's lower back to reduce sacral pressure. Pt practiced lateral leans in both directions and forward lean for 1 minute duration to alleviate pressure off ischial tuberosities, and performed 3x15 reps of w/c push ups using pressure mapping device as visual feedback tool. PT initiated uneven transfer from w/c>elevated mat using 1 inch  block and with 2 person assist. Therapists provided multimodal cues for proper trunk flexion, extremity placement, and maintenance of head hip relationship for efficiency with slide board transfer. PT then initiated long sitting for hamstring stretching for 5 min duration for increased flexibility. PT attempted to initiate ring sitting; however, inc tone and tightness limited patient's ability to assume position. Pt performed slide board transfer from elevated mat> w/c with mod A plus 1 with improved efficiency when transferring to a lower surface. PT pushed w/c back to room for time management and positioned patient sitting with tray table and call bell in reach and all needs met.  Therapy Documentation Precautions:  Precautions Precautions: Fall Precaution Comments: throracic region, LEs spasicity  Restrictions Weight Bearing Restrictions: Yes LLE Weight Bearing: Touchdown weight bearing Other Position/Activity Restrictions: LLE NWB   See Function Navigator for Current Functional Status.   Therapy/Group: Individual Therapy  Floreen Comber 10/05/2017, 3:47 PM

## 2017-10-05 NOTE — Plan of Care (Signed)
  Problem: Consults Goal: RH SPINAL CORD INJURY PATIENT EDUCATION Description  See Patient Education module for education specifics.  Outcome: Progressing Goal: Skin Care Protocol Initiated - if Braden Score 18 or less Description If consults are not indicated, leave blank or document N/A Outcome: Progressing Goal: Nutrition Consult-if indicated Outcome: Progressing   Problem: SCI BOWEL ELIMINATION Goal: RH STG MANAGE BOWEL WITH ASSISTANCE Description STG Manage Bowel with mod Assistance.  Outcome: Progressing Goal: RH STG SCI MANAGE BOWEL WITH MEDICATION WITH ASSISTANCE Description STG SCI Manage bowel with medication with mod assistance.  Outcome: Progressing Goal: RH STG MANAGE BOWEL W/EQUIPMENT W/ASSISTANCE Description STG Manage Bowel With Equipment With mod Assistance  Outcome: Progressing Goal: RH STG SCI MANAGE BOWEL PROGRAM W/ASSIST OR AS APPROPRIATE Description STG SCI Manage bowel program w/assist mod   Outcome: Progressing   Problem: SCI BLADDER ELIMINATION Goal: RH STG MANAGE BLADDER WITH ASSISTANCE Description STG Manage Bladder With mod Assistance  Outcome: Progressing   Problem: RH SKIN INTEGRITY Goal: RH STG SKIN FREE OF INFECTION/BREAKDOWN Description Skin wound healing and no new skin breakdown with min assistance  Outcome: Progressing Goal: RH STG MAINTAIN SKIN INTEGRITY WITH ASSISTANCE Description STG Maintain Skin Integrity With min Assistance.  Outcome: Progressing Goal: RH STG ABLE TO PERFORM INCISION/WOUND CARE W/ASSISTANCE Description STG Able To Perform Incision/Wound Care With mod Assistance.  Outcome: Progressing   Problem: RH SAFETY Goal: RH STG ADHERE TO SAFETY PRECAUTIONS W/ASSISTANCE/DEVICE Description STG Adhere to Safety Precautions With min Assistance/Device.  Outcome: Progressing   Problem: RH PAIN MANAGEMENT Goal: RH STG PAIN MANAGED AT OR BELOW PT'S PAIN GOAL Description Pain < or = 4 with min assistance  Outcome:  Progressing   Problem: RH KNOWLEDGE DEFICIT SCI Goal: RH STG INCREASE KNOWLEDGE OF SELF CARE AFTER SCI Description Patient able to verbalize safe care activities with min assistance   Outcome: Progressing

## 2017-10-05 NOTE — Progress Notes (Signed)
Occupational Therapy Session Note  Patient Details  Name: Sarah [name] MRN: [id number] Date of Birth: [birth year]  Today's Date: 10/05/2017  Session 1 OT Individual Time: 2883-3744 OT Individual Time Calculation (min): 55 min   Session 2 OT Individual Time: 5146-0479 OT Individual Time Calculation (min): 10 min  and Today's Date: 10/05/2017 OT Missed Time: 20 Minutes Missed Time Reason: Patient ill (comment)(nausea)  Short Term Goals: Week 2:  OT Short Term Goal 1 (Week 2): Pt will transfer to padded tub bench with mod A OT Short Term Goal 2 (Week 2): Pt will maintain dynamic sitting balance in prep for ADL with min A.  OT Short Term Goal 3 (Week 2): Pt will don LB clothing with mod A from bed level.  Skilled Therapeutic Interventions/Progress Updates:  Session 1   Pt greeted semi-reclined in bed and agreeable to OT treatment session. Discussed plan for compression to B LEs and weeping skin with RN Loree Fee, who entered room to change dressings and assist with TED hose and Ace Wraps to B LEs. Pt needed assist from OT to lift B LEs and maintain elevation while RN wrapped legs. Pt declined LB bathing, but worked on bed level LB dressing using reacher. Pt needed assistance to position LEs to pull material over knees, then assistance to advance over hips using rolling technique. OT donned pt's shoes in bed, then pt transferred to sitting EOB using leg lifter to advance LEs, then OT assist to elevate trunk. Pt able to direct care for slideboard placement. Bed raised and platform placed under LEs to transfer across slight decline with Mod A. Facilitation and verbal cues for head/hips relationship in order to scoot back in the chair. Pt propelled wc to the sink and completed UB bathing/dressing and grooming tasks with set-up A to obtain items. Pt left seated in wc at the sink doing her hair with needs met.   Session 2 Pt greeted seated in wc and reported onset of nausea. Pt drinking ginger ale and  tried to take small bites of crackers. Provided pt with emesis bag. Informed RN of pt status and request for nausea medication. Pt declined further participation 2/2 continued nausea. OT to follow up per plan of care.   Therapy Documentation Precautions:  Precautions Precautions: Fall Precaution Comments: throracic region, LEs spasicity  Restrictions Weight Bearing Restrictions: Yes LLE Weight Bearing: Touchdown weight bearing Other Position/Activity Restrictions: LLE NWB Pain:  none/denies pain ADL: ADL ADL Comments: see functional navigator  See Function Navigator for Current Functional Status.  Therapy/Group: Individual Therapy  Valma Cava 10/05/2017, 8:49 AM

## 2017-10-06 ENCOUNTER — Inpatient Hospital Stay (HOSPITAL_COMMUNITY): Payer: Medicare Other | Admitting: Physical Therapy

## 2017-10-06 ENCOUNTER — Inpatient Hospital Stay (HOSPITAL_COMMUNITY): Payer: Medicare Other | Admitting: Occupational Therapy

## 2017-10-06 LAB — BASIC METABOLIC PANEL
ANION GAP: 5 (ref 5–15)
BUN: 8 mg/dL (ref 6–20)
CALCIUM: 7.8 mg/dL — AB (ref 8.9–10.3)
CHLORIDE: 103 mmol/L (ref 98–111)
CO2: 31 mmol/L (ref 22–32)
Creatinine, Ser: 0.84 mg/dL (ref 0.44–1.00)
GFR calc Af Amer: >60 mL/min (ref >60)
Glucose, Bld: 105 mg/dL — ABNORMAL HIGH (ref 70–99)
POTASSIUM: 4.2 mmol/L (ref 3.5–5.1)
SODIUM: 139 mmol/L (ref 135–145)

## 2017-10-06 LAB — GLUCOSE, CAPILLARY: GLUCOSE-CAPILLARY: 89 mg/dL (ref 70–99)

## 2017-10-06 LAB — CBC
HCT: 25.2 % — ABNORMAL LOW (ref 36.0–46.0)
Hemoglobin: 7.8 g/dL — ABNORMAL LOW (ref 12.0–15.0)
MCH: 32.1 pg (ref 26.0–34.0)
MCHC: 31 g/dL (ref 30.0–36.0)
MCV: 103.7 fL — ABNORMAL HIGH (ref 78.0–100.0)
Platelets: 82 10*3/uL — ABNORMAL LOW (ref 150–400)
RBC: 2.43 MIL/uL — AB (ref 3.87–5.11)
RDW: 19.7 % — ABNORMAL HIGH (ref 11.5–15.5)
WBC: 2.2 10*3/uL — ABNORMAL LOW (ref 4.0–10.5)

## 2017-10-06 MED ORDER — NITROFURANTOIN MONOHYD MACRO 100 MG PO CAPS
100.0000 mg | ORAL_CAPSULE | Freq: Two times a day (BID) | ORAL | Status: DC
  Administered 2017-10-06 – 2017-10-08 (×4): 100 mg via ORAL
  Filled 2017-10-06 (×4): qty 1

## 2017-10-06 NOTE — Progress Notes (Signed)
Subjective/Complaints:  Up eating breakfast. No new problems.  Pain controlled  ROS: Patient denies fever, rash, sore throat, blurred vision, nausea, vomiting, diarrhea, cough, shortness of breath or chest pain, joint or back pain, headache, or mood change.    Objective: Vital Signs: Blood pressure 115/75, pulse 79, temperature 98.3 F (36.8 C), temperature source Oral, resp. rate 18, height '5\' 11"'$  (1.803 m), weight 103.3 kg (227 lb 13.2 oz), last menstrual period 10/22/2016, SpO2 100 %. No results found. Results for orders placed or performed during the hospital encounter of 09/21/17 (from the past 72 hour(s))  Basic metabolic panel     Status: Abnormal   Collection Time: 10/06/17  5:45 AM  Result Value Ref Range   Sodium 139 135 - 145 mmol/L   Potassium 4.2 3.5 - 5.1 mmol/L   Chloride 103 98 - 111 mmol/L    Comment: Please note change in reference range.   CO2 31 22 - 32 mmol/L   Glucose, Bld 105 (H) 70 - 99 mg/dL    Comment: Please note change in reference range.   BUN 8 6 - 20 mg/dL    Comment: Please note change in reference range.   Creatinine, Ser 0.84 0.44 - 1.00 mg/dL   Calcium 7.8 (L) 8.9 - 10.3 mg/dL   GFR calc non Af Amer >60 >60 mL/min   GFR calc Af Amer >60 >60 mL/min    Comment: (NOTE) The eGFR has been calculated using the CKD EPI equation. This calculation has not been validated in all clinical situations. eGFR's persistently <60 mL/min signify possible Chronic Kidney Disease.    Anion gap 5 5 - 15    Comment: Performed at [facility] [address], [place], [place] [address]  CBC     Status: Abnormal   Collection Time: 10/06/17  5:45 AM  Result Value Ref Range   WBC 2.2 (L) 4.0 - 10.5 K/uL   RBC 2.43 (L) 3.87 - 5.11 MIL/uL   Hemoglobin 7.8 (L) 12.0 - 15.0 g/dL   HCT 25.2 (L) 36.0 - 46.0 %   MCV 103.7 (H) 78.0 - 100.0 fL   MCH 32.1 26.0 - 34.0 pg   MCHC 31.0 30.0 - 36.0 g/dL   RDW 19.7 (H) 11.5 - 15.5 %   Platelets 82 (L) 150 - 400 K/uL   Comment: SPECIMEN CHECKED FOR CLOTS PLATELET COUNT CONFIRMED BY SMEAR Performed at [facility], [address], [place], [place] [address]    *Note: Due to a large number of results and/or encounters for the requested time period, some results have not been displayed. A complete set of results can be found in Results Review.     Constitutional: No distress . Vital signs reviewed. HEENT: EOMI, oral membranes moist Neck: supple Cardiovascular: RRR without murmur. No JVD    Respiratory: CTA Bilaterally without wheezes or rales. Normal effort    GI: BS +, non-tender, non-distended  Neuro: Alert/Oriented, Abnormal Sensory absent motor and sensory below T2 and Abnormal Motor 5/5 BUE, 0/5 BLE, mild extensor tone while in w/c--neuro exam unchanged Musc/Skel:  Other no pain with UE AROM, no pain with LE PROM Psych: pleasant Skin: surgical wound intact   Assessment/Plan: 1. Functional deficits secondary to T3 paraplegia which require 3+ hours per day of interdisciplinary therapy in a comprehensive inpatient rehab setting. Physiatrist is providing close team supervision and 24 hour management of active medical problems listed below. Physiatrist and rehab team continue to assess barriers to  discharge/monitor patient progress toward functional and medical goals. FIM: Function - Bathing Position: Bed Body parts bathed by patient: Right arm, Left arm, Chest, Abdomen, Right upper leg, Left upper leg, Right lower leg, Left lower leg Body parts bathed by helper: Back, Front perineal area, Buttocks Bathing not applicable: Front perineal area, Buttocks Assist Level: Touching or steadying assistance(Pt > 75%), Assistive device Assistive Device Comment: LH sponge  Function- Upper Body Dressing/Undressing What is the patient wearing?: Pull over shirt/dress, Bra Bra - Perfomed by patient: Thread/unthread right bra strap, Thread/unthread left bra strap, Hook/unhook bra (pull down sports bra) Bra -  Perfomed by helper: Hook/unhook bra (pull down sports bra) Pull over shirt/dress - Perfomed by patient: Thread/unthread right sleeve, Thread/unthread left sleeve, Put head through opening, Pull shirt over trunk Pull over shirt/dress - Perfomed by helper: Pull shirt over trunk Assist Level: Set up, Supervision or verbal cues Set up : To obtain clothing/put away Function - Lower Body Dressing/Undressing What is the patient wearing?: Pants, Ted Hose, Shoes Position: Bed Pants- Performed by patient: Thread/unthread right pants leg, Thread/unthread left pants leg, Pull pants up/down Pants- Performed by helper: Pull pants up/down Shoes - Performed by helper: Don/doff right shoe, Don/doff left shoe, Fasten right, Fasten left TED Hose - Performed by helper: Don/doff right TED hose, Don/doff left TED hose Assist for footwear: Dependant Assist for lower body dressing: (minA)  Function - Toileting Toileting activity did not occur: No continent bowel/bladder event Toileting steps completed by patient: Adjust clothing prior to toileting, Performs perineal hygiene, Adjust clothing after toileting(per Jobelle Mesias, NT) Toileting steps completed by helper: Adjust clothing after toileting Assist level: Two helpers  Function - Air cabin crew transfer activity did not occur: Safety/medical concerns Assist level to toilet: 2 helpers(per Wyvonnia Lora, NT) Assist level from toilet: 2 helpers  Function - Chair/bed transfer Chair/bed transfer method: Lateral scoot Chair/bed transfer assist level: Moderate assist (Pt 50 - 74%/lift or lower) Chair/bed transfer assistive device: Sliding board Chair/bed transfer details: Manual facilitation for placement, Manual facilitation for weight shifting, Verbal cues for safe use of DME/AE, Verbal cues for precautions/safety, Verbal cues for technique, Verbal cues for sequencing, Tactile cues for sequencing, Tactile cues for weight shifting  Function -  Locomotion: Wheelchair Will patient use wheelchair at discharge?: Yes Type: Manual Max wheelchair distance: 150 Assist Level: No help, No cues, assistive device, takes more than reasonable amount of time Wheel 50 feet with 2 turns activity did not occur: Safety/medical concerns Assist Level: No help, No cues, assistive device, takes more than reasonable amount of time Wheel 150 feet activity did not occur: Safety/medical concerns Assist Level: No help, No cues, assistive device, takes more than reasonable amount of time Turns around,maneuvers to table,bed, and toilet,negotiates 3% grade,maneuvers on rugs and over doorsills: Yes Function - Locomotion: Ambulation Ambulation activity did not occur: Safety/medical concerns Walk 10 feet activity did not occur: Safety/medical concerns Walk 50 feet with 2 turns activity did not occur: Safety/medical concerns Walk 150 feet activity did not occur: Safety/medical concerns Walk 10 feet on uneven surfaces activity did not occur: Safety/medical concerns  Function - Comprehension Comprehension: Auditory Comprehension assist level: Understands complex 90% of the time/cues 10% of the time  Function - Expression Expression: Verbal Expression assist level: Expresses complex ideas: With extra time/assistive device  Function - Social Interaction Social Interaction assist level: Interacts appropriately with others with medication or extra time (anti-anxiety, antidepressant).  Function - Problem Solving Problem solving assist level: Solves complex 90% of  the time/cues < 10% of the time  Function - Memory Memory assist level: Complete Independence: No helper Patient normally able to recall (first 3 days only): Current season, That he or she is in a hospital   Medical Problem List and Plan: 1.  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    -team conf today   2.  DVT Prophylaxis/Anticoagulation/history of pulmonary emboli: Xarelto.  Monitor for any bleeding episodes 3. Pain Management: Baclofen 20 mg nightly,  -continue tramadol for more severe pain  -continue tylenol prn 4. Mood: Zoloft 25 mg daily.  Provide emotional support 5. Neuropsych: This patient is capable of making decisions on her own behalf. 6. Skin/Wound Care: Routine skin checks, surgical sites CDI 7. Fluids/Electrolytes/Nutrition: check labs tomorrow 8.  Left femoral shaft fracture.  Status post intramedullary retrograde nailing 09/11/2017.  Nonweightbearing. 9.  Acute blood loss anemia.  Follow-up CBC 7/2 , stable 10.  Hyperglycemia secondary to steroid.  improved  -dc'ed cbg checks and SSI 11.  Neurogenic bowel/ bladder.    -  PM bowel program with some improvement  -increased ditropan to TID  -100k Ecoli: macrobid initiated 7/11--->7 day course  -targeting a cath schedule which keeps her dry during the day 12.  Hypothyroidism.  Synthroid 13.  Mild thrombocytopenia - monitor 14.  Dysphagia due to thrush:   nystatin  LOS (Days) 15 A FACE TO FACE EVALUATION WAS PERFORMED  [name] 10/06/2017, 8:57 AM

## 2017-10-06 NOTE — Progress Notes (Signed)
Physical Therapy Session Note  Patient Details  Name: Sarah [name] MRN: [id number] Date of Birth: 10/21/1965  Today's Date: 10/06/2017 Sarah [name] Individual Time: 5176-1607 Sarah [name] Individual Time Calculation (min): 70 min   Short Term Goals: Week 2:  Sarah [name] Short Term Goal 1 (Week 2): Sarah [name] will perform bed mobility with min A  Sarah [name] Short Term Goal 2 (Week 2): Sarah [name] will perform car transfer with mod A +1 Sarah [name] Short Term Goal 3 (Week 2): Sarah [name] will be able to verbalize and correctly demonstrate pressure relief in her w/c with mod I Sarah [name] Short Term Goal 4 (Week 2): Sarah [name] will be able to appropriate place transfer board in preparation for lateral scoot transfers with supervision  Skilled Therapeutic Interventions/Progress Updates:  Sarah [name] presented in w/c receiving pain meds agreeable to therapy. Sarah [name] propelled w/c to rehab gym where session focused on use of leg loops, transfers and sitting balance. Sarah [name] performed SB transfer to level mat with mod to maxA. Sarah [name] required cues for increased head hips relationship and placement of BUE to facilitate transfer. Once on mat PTA placed leg loops on Sarah [name]. Sarah [name] indicated difficulty earlier in day as velcro not maintaining adherence causing R thigh loop to separate when used. PTA able to place and adjust noting some slippage of Velcro initially but loop remained closed throughout session. Sarah [name] continued practice of scooting L/R across mat for improved facilitation of SB transfer. Sarah [name] also performed lateral leans x 5 L/R to facilitate increased lean and boost during transfers. Sarah [name] attempted to turn onto mat for ring sit position. Sarah [name] required modA with use of leg loops to lift and position legs. Sarah [name] able to tolerate ring sit for approx 5 min with PTA providing no truncal support. Sarah [name] able to lift hands from mat for brief periods without LOB. Sarah [name] required modA for placing BLE in long sit and Sarah [name] able to reposition to supine. PTA performed gentle PROM to BLE KTC and hip ER/IR x 5 bilaterally. Required modA for truncal  support to return to long sit and Sarah [name] required modA for use of leg loops and verbal cues to return to sitting at EOM. SB transfer to return to w/c modA with PTA providing mod/maxA for repositioning in w/c. Sarah [name] propelled back to room and remained in w/c with call bell within reach and current needs met.      Therapy Documentation Precautions:  Precautions Precautions: Fall Precaution Comments: throracic region, LEs spasicity  Restrictions Weight Bearing Restrictions: Yes LLE Weight Bearing: Touchdown weight bearing Other Position/Activity Restrictions: LLE NWB General:   Vital Signs:   Pain: Pain Assessment Pain Score: 6   See Function Navigator for Current Functional Status.   Therapy/Group: Individual Therapy  Afton Mikelson  Jahayra Mazo, PTA  10/06/2017, 12:52 PM

## 2017-10-06 NOTE — Plan of Care (Signed)
  Problem: RH SAFETY Goal: RH STG ADHERE TO SAFETY PRECAUTIONS W/ASSISTANCE/DEVICE Description STG Adhere to Safety Precautions With min Assistance/Device.  Outcome: Progressing   Call light within reach   Problem: RH PAIN MANAGEMENT Goal: RH STG PAIN MANAGED AT OR BELOW PT'S PAIN GOAL Description Pain < or = 4 with min assistance  Outcome: Progressing  Assess pain, administered pain regimen as needed.

## 2017-10-06 NOTE — Progress Notes (Signed)
Occupational Therapy Session Note  Patient Details  Name: Sarah [name] MRN: [id number] Date of Birth: Oct 11, 1965  Today's Date: 10/06/2017 OT Individual Time: 0700-0809 and 1115-1130 OT Individual Time Calculation (min): 69 min  and 15 min  Today's Date: 10/06/2017 OT Missed Time: 30 Minutes Missed Time Reason: Patient ill (comment)(nausea and vomiting)   Short Term Goals: Week 2:  OT Short Term Goal 1 (Week 2): Pt will transfer to padded tub bench with mod A OT Short Term Goal 2 (Week 2): Pt will maintain dynamic sitting balance in prep for ADL with min A.  OT Short Term Goal 3 (Week 2): Pt will don LB clothing with mod A from bed level.  Skilled Therapeutic Interventions/Progress Updates:    Session 1: Upon entering the room, pt supine in bed with set up A for washing UB and LB with use of LH reacher and sponge to increase independence with task. Pt utilized reacher to thread pants onto B feet and requires min A for rolling L <> R but able to pull pants over B hips herself. OT assisted pt with donning B TED hose and shoes. Pt rolling towards L side in bed and utilized leg lifter to remove B LEs from edge of bed. Pt required min A for trunk support when coming into seated position. Pt placing slide board this session with increased time. Pt required mod A for slide board transfer towards the R and needing assistance with better positioning in wheelchair. Pt propelled to sink for grooming task with overall mod I to complete tasks from wheelchair level. Pt remained in wheelchair with breakfast tray placed in front of her with call bell and all needed items within reach upon exiting the room.   Session 2: Upon entering the room, pt reports feeling nauseated. RN aware and gave medication prior to OT arrival. Pt requesting BP and blood glucose check with BP being 140/82 and blood glucose of 89 when checked by NT. Pt began vomiting and therapist placed cold cloth on neck and provided pt with ginger  ale. OT also set up items for oral hygiene when pt able to perform task. Pt remained seated in wheelchair and declined OT intervention secondary to nausea and vomiting. Call bell and all needed items within reach upon exiting the room.   Therapy Documentation Precautions:  Precautions Precautions: Fall Precaution Comments: throracic region, LEs spasicity  Restrictions Weight Bearing Restrictions: Yes LLE Weight Bearing: Touchdown weight bearing Other Position/Activity Restrictions: LLE NWB General: General OT Amount of Missed Time: 30 Minutes Vital Signs:   Pain: Pain Assessment Pain Score: 6  ADL: ADL ADL Comments: see functional navigator  See Function Navigator for Current Functional Status.   Therapy/Group: Individual Therapy  Sarah [name] 10/06/2017, 12:16 PM

## 2017-10-07 ENCOUNTER — Inpatient Hospital Stay (HOSPITAL_COMMUNITY): Payer: Medicare Other | Admitting: Occupational Therapy

## 2017-10-07 ENCOUNTER — Inpatient Hospital Stay (HOSPITAL_COMMUNITY): Payer: Medicare Other | Admitting: Physical Therapy

## 2017-10-07 ENCOUNTER — Encounter: Payer: Medicare Other | Admitting: Physical Medicine & Rehabilitation

## 2017-10-07 ENCOUNTER — Inpatient Hospital Stay (HOSPITAL_COMMUNITY): Payer: Medicare Other

## 2017-10-07 ENCOUNTER — Ambulatory Visit (HOSPITAL_COMMUNITY): Payer: Medicare Other | Admitting: Occupational Therapy

## 2017-10-07 NOTE — Progress Notes (Signed)
Physical Therapy Session Note  Patient Details  Name: Sarah [name] MRN: [id number] Date of Birth: 12-Dec-1965  Today's Date: 10/07/2017 PT Individual Time: 1030-1130 PT Individual Time Calculation (min): 60 min   Short Term Goals: Week 2:  PT Short Term Goal 1 (Week 2): Pt will perform bed mobility with min A  PT Short Term Goal 2 (Week 2): Pt will perform car transfer with mod A +1 PT Short Term Goal 3 (Week 2): Pt will be able to verbalize and correctly demonstrate pressure relief in her w/c with mod I PT Short Term Goal 4 (Week 2): Pt will be able to appropriate place transfer board in preparation for lateral scoot transfers with supervision  Skilled Therapeutic Interventions/Progress Updates:    Pt received seated in w/c in room, just had some nausea and emesis following administration of medication. RN aware. Pt agreeable to therapy session, no complaints of pain but ongoing nausea throughout therapy session. Pt declines to participate in transfers from w/c to mat due to nausea. Seated core therex with 4.4# weighted ball: punch-outs, OH lift. Seated BUE therex with 4# dowel: bicep curls, tricep ext, shoulder flex, punch-outs. Manual w/c propulsion 2 x 150 ft with BUE Mod I. NA requests pt back to bed at end of session for bladder scan. Sliding board transfer w/c to bed with mod A, assist for setup of sliding board required and v/c for head/hips relationship during transfer. Sit to supine mod A for BLE management. Rolling L/R with min A and use of bedrail to assist pt with changing her pants per pt request. Pt left semi-reclined in bed with needs in reach in care of RN.  Therapy Documentation Precautions:  Precautions Precautions: Fall Precaution Comments: throracic region, LEs spasicity  Restrictions Weight Bearing Restrictions: Yes LLE Weight Bearing: Touchdown weight bearing Other Position/Activity Restrictions: LLE NWB  See Function Navigator for Current Functional  Status.   Therapy/Group: Individual Therapy  Excell Seltzer, PT, DPT  10/07/2017, 11:59 AM

## 2017-10-07 NOTE — Progress Notes (Signed)
Social Work Patient ID: Sarah [name], female   DOB: Aug 13, 1965, 52 y.o.   MRN: [id number]   Have reviewed team conference with pt who admits she is feeling anxious about her d/c next week.  Very concerned about her b/b management and hopes to avoid need for foley.  We discussed that foley may be only option of bladder management that will allow her to be independent at home alone for several hours each day.  She is also concerned about transfers and that level of assistance she still needs.  She states that she is willing to change to foley "if that's what I have to do."  Will continue to follow for d/c planning needs.  Sarah Kedzierski, LCSW

## 2017-10-07 NOTE — Progress Notes (Signed)
Pt A/O, no noted distress. BLE did not administer dressing, skin intact administered barrier cream to area. Second day she has experienced N/V after breakfast. She requested specific meds not to be crushed. Staff will continue to monitor and meet needs.

## 2017-10-07 NOTE — Progress Notes (Signed)
Subjective/Complaints:  No problems this morning. Has had nausea the last two days after taking macrobid. (also was using tramadol).   ROS: Patient denies fever, rash, sore throat, blurred vision, nausea, vomiting, diarrhea, cough, shortness of breath or chest pain, joint or back pain, headache, or mood change. .    Objective: Vital Signs: Blood pressure 127/77, pulse 84, temperature 98.4 F (36.9 C), temperature source Oral, resp. rate 18, height _0  (1.803 m), weight 103.3 kg (227 lb 13.2 oz), last menstrual period 10/22/2016, SpO2 100 %. No results found. Results for orders placed or performed during the hospital encounter of 09/21/17 (from the past 72 hour(s))  Basic metabolic panel     Status: Abnormal   Collection Time: 10/06/17  5:45 AM  Result Value Ref Range   Sodium 139 135 - 145 mmol/L   Potassium 4.2 3.5 - 5.1 mmol/L   Chloride 103 98 - 111 mmol/L    Comment: Please note change in reference range.   CO2 31 22 - 32 mmol/L   Glucose, Bld 105 (H) 70 - 99 mg/dL    Comment: Please note change in reference range.   BUN 8 6 - 20 mg/dL    Comment: Please note change in reference range.   Creatinine, Ser 0.84 0.44 - 1.00 mg/dL   Calcium 7.8 (L) 8.9 - 10.3 mg/dL   GFR calc non Af Amer >60 >60 mL/min   GFR calc Af Amer >60 >60 mL/min    Comment: (NOTE) The eGFR has been calculated using the CKD EPI equation. This calculation has not been validated in all clinical situations. eGFR's persistently <60 mL/min signify possible Chronic Kidney Disease.    Anion gap 5 5 - 15    Comment: Performed at [facility] [address] [place], [place] [address]  CBC     Status: Abnormal   Collection Time: 10/06/17  5:45 AM  Result Value Ref Range   WBC 2.2 (L) 4.0 - 10.5 K/uL   RBC 2.43 (L) 3.87 - 5.11 MIL/uL   Hemoglobin 7.8 (L) 12.0 - 15.0 g/dL   HCT 25.2 (L) 36.0 - 46.0 %   MCV 103.7 (H) 78.0 - 100.0 fL   MCH 32.1 26.0 - 34.0 pg   MCHC 31.0 30.0 - 36.0 g/dL   RDW 19.7  (H) 11.5 - 15.5 %   Platelets 82 (L) 150 - 400 K/uL    Comment: SPECIMEN CHECKED FOR CLOTS PLATELET COUNT CONFIRMED BY SMEAR Performed at [facility], [address] [place], [place] [address]   Glucose, capillary     Status: None   Collection Time: 10/06/17 11:21 AM  Result Value Ref Range   Glucose-Capillary 89 70 - 99 mg/dL   *Note: Due to a large number of results and/or encounters for the requested time period, some results have not been displayed. A complete set of results can be found in Results Review.     Constitutional: No distress . Vital signs reviewed. HEENT: EOMI, oral membranes moist Neck: supple Cardiovascular: RRR without murmur. No JVD    Respiratory: CTA Bilaterally without wheezes or rales. Normal effort    GI: BS +, non-tender, non-distended  Neuro: Alert/Oriented, Abnormal Sensory absent motor and sensory below T2 and Abnormal Motor 5/5 BUE, 0/5 BLE, mild extensor tone while in w/c--stable exam Musc/Skel:  Other no pain with UE AROM, no pain with LE PROM Psych: pleasant Skin: surgical wound healing and intact   Assessment/Plan: 1. Functional deficits secondary  to T3 paraplegia which require 3+ hours per day of interdisciplinary therapy in a comprehensive inpatient rehab setting. Physiatrist is providing close team supervision and 24 hour management of active medical problems listed below. Physiatrist and rehab team continue to assess barriers to discharge/monitor patient progress toward functional and medical goals. FIM: Function - Bathing Position: Bed Body parts bathed by patient: Right arm, Left arm, Chest, Abdomen, Right upper leg, Left upper leg, Right lower leg, Left lower leg, Front perineal area Body parts bathed by helper: Buttocks Bathing not applicable: Front perineal area, Buttocks Assist Level: Touching or steadying assistance(Pt > 75%), Assistive device Assistive Device Comment: LH sponge and reacher  Function- Upper Body  Dressing/Undressing What is the patient wearing?: Pull over shirt/dress, Bra Bra - Perfomed by patient: Thread/unthread right bra strap, Thread/unthread left bra strap, Hook/unhook bra (pull down sports bra) Bra - Perfomed by helper: Hook/unhook bra (pull down sports bra) Pull over shirt/dress - Perfomed by patient: Thread/unthread right sleeve, Thread/unthread left sleeve, Put head through opening, Pull shirt over trunk Pull over shirt/dress - Perfomed by helper: Pull shirt over trunk Assist Level: Set up, Supervision or verbal cues Set up : To obtain clothing/put away Function - Lower Body Dressing/Undressing What is the patient wearing?: Pants, Maryln Manuel, Shoes Position: Bed Pants- Performed by patient: Thread/unthread right pants leg, Thread/unthread left pants leg, Pull pants up/down Pants- Performed by helper: Pull pants up/down Shoes - Performed by helper: Don/doff right shoe, Don/doff left shoe, Fasten right, Fasten left TED Hose - Performed by helper: Don/doff right TED hose, Don/doff left TED hose Assist for footwear: Supervision/touching assist Assist for lower body dressing: Touching or steadying assistance (Pt > 75%)  Function - Toileting Toileting activity did not occur: No continent bowel/bladder event Toileting steps completed by patient: Adjust clothing prior to toileting, Performs perineal hygiene, Adjust clothing after toileting(per Jobelle Mesias, NT) Toileting steps completed by helper: Adjust clothing after toileting Assist level: Two helpers  Function - Air cabin crew transfer activity did not occur: Safety/medical concerns Assist level to toilet: 2 helpers(per Wyvonnia Lora, NT) Assist level from toilet: 2 helpers  Function - Chair/bed transfer Chair/bed transfer method: Lateral scoot Chair/bed transfer assist level: Moderate assist (Pt 50 - 74%/lift or lower) Chair/bed transfer assistive device: Sliding board Chair/bed transfer details: Manual  facilitation for placement, Manual facilitation for weight shifting, Verbal cues for safe use of DME/AE, Verbal cues for precautions/safety, Verbal cues for technique, Verbal cues for sequencing, Tactile cues for sequencing, Tactile cues for weight shifting  Function - Locomotion: Wheelchair Will patient use wheelchair at discharge?: Yes Type: Manual Max wheelchair distance: 150 Assist Level: No help, No cues, assistive device, takes more than reasonable amount of time Wheel 50 feet with 2 turns activity did not occur: Safety/medical concerns Assist Level: No help, No cues, assistive device, takes more than reasonable amount of time Wheel 150 feet activity did not occur: Safety/medical concerns Assist Level: No help, No cues, assistive device, takes more than reasonable amount of time Turns around,maneuvers to table,bed, and toilet,negotiates 3% grade,maneuvers on rugs and over doorsills: Yes Function - Locomotion: Ambulation Ambulation activity did not occur: Safety/medical concerns Walk 10 feet activity did not occur: Safety/medical concerns Walk 50 feet with 2 turns activity did not occur: Safety/medical concerns Walk 150 feet activity did not occur: Safety/medical concerns Walk 10 feet on uneven surfaces activity did not occur: Safety/medical concerns  Function - Comprehension Comprehension: Auditory Comprehension assist level: Understands complex 90% of the time/cues 10% of  the time  Function - Expression Expression: Verbal Expression assist level: Expresses complex ideas: With extra time/assistive device  Function - Social Interaction Social Interaction assist level: Interacts appropriately with others with medication or extra time (anti-anxiety, antidepressant).  Function - Problem Solving Problem solving assist level: Solves complex 90% of the time/cues < 10% of the time  Function - Memory Memory assist level: Complete Independence: No helper Patient normally able to recall  (first 3 days only): Current season, That he or she is in a hospital   Medical Problem List and Plan: 1.  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    -continues to progress toward goals    Mobility Assessment  Eliah Ozawa was seen today for the purpose of a mobility assessment for a wheelchair. I have reviewed and agree with the detailed PT evaluation. This patient suffers from paraplegia related to thoracic myelopathy. Due to her paraplegia, this patient is unable to utilize a cane or walker. The patient is appropriate for a customized manual wheelchair. Specifically, the patient requires an ultra light-weight  manual  wheelchair.  With this wheelchair the patient can move independently at a household level and in the community. The chair will also allow the patient to perform ADL's more easily. The patient is competent to operate the recommended chair on her own and is motivated to utilize the chair on a daily basis.        2.  DVT Prophylaxis/Anticoagulation/history of pulmonary emboli: Xarelto.  Monitor for any bleeding episodes 3. Pain Management: Baclofen 20 mg nightly,  -continue tramadol for more severe pain  -continue tylenol prn 4. Mood: Zoloft 25 mg daily.  Provide emotional support 5. Neuropsych: This patient is capable of making decisions on her own behalf. 6. Skin/Wound Care: Routine skin checks, surgical sites CDI 7. Fluids/Electrolytes/Nutrition: check labs tomorrow 8.  Left femoral shaft fracture.  Status post intramedullary retrograde nailing 09/11/2017.  Nonweightbearing. 9.  Acute blood loss anemia.  Follow-up CBC 7/2 , stable 10.  Hyperglycemia secondary to steroid.  improved  -dc'ed cbg checks and SSI 11.  Neurogenic bowel/ bladder.    -  PM bowel program with some improvement  -increased ditropan to TID  -100k Ecoli:  macrobid initiated 7/11--->7 day course ?nausea   -change abx if persistent nausea  -targeting a cath schedule which keeps her dry during the day  -may be home alone from 7am to 1pm a few days a week    -ration fluids vs foley once home 12.  Hypothyroidism.  Synthroid 13.  Mild thrombocytopenia - monitor 14.  Dysphagia due to thrush:   nystatin  LOS (Days) 16 A FACE TO FACE EVALUATION WAS PERFORMED  [name] 10/07/2017, 9:18 AM

## 2017-10-07 NOTE — Progress Notes (Signed)
Physical Therapy Session Note  Patient Details  Name: Sarah [name] MRN: [id number] Date of Birth: 14-Jun-1965  Today's Date: 10/07/2017 PT Individual Time: 0900-1000 PT Individual Time Calculation (min): 60 min   Short Term Goals: Week 2:  PT Short Term Goal 1 (Week 2): Pt will perform bed mobility with min A  PT Short Term Goal 2 (Week 2): Pt will perform car transfer with mod A +1 PT Short Term Goal 3 (Week 2): Pt will be able to verbalize and correctly demonstrate pressure relief in her w/c with mod I PT Short Term Goal 4 (Week 2): Pt will be able to appropriate place transfer board in preparation for lateral scoot transfers with supervision  Skilled Therapeutic Interventions/Progress Updates:    Pt reported no pain prior to treatment session onset and requested bed level bathing. PT notes multiple pressure sores <1 cm in diameter to B LE's with no known origin when bed splints were removed. Pt indicated she was not aware of this issue and the sores were not present last week. PT notified primary nurse and nurse made aware of the issue indicating she did not want to cover them up prior to application of teds. PT initiated LE stretching to improve flexibility including B hamstrings, gastroc/soleus, hip IR & ER for 1x/ 1 min duration. Pt performed bicep curls in supine for x30 reps alternating b/w UE's using filled water bottles as hand weights for strengthening. Pt able to doff/ don shirt and clean UE's, trunk, and upper thighs with set up assist from therapist. Therapist provided total A for washing lower half of B LE's. Therapist provided total A for doffing brief and donning clean brief with Pt performing rolling L&R with min A at pelvis for rolling facilitation and guard rails for UE assist. PT applied teds and aces with total A and pulled pants up to lower thighs with Pt then able to continuing donning pants requiring mod A to pull pants up fully. Pt BP was assessed in supine to be 140/85 and  therapist removed aces. Pt performed supine> sitting EOB with max A for LE and pelvis management. Pt attempted to use L leg loop to lower L LE off bed but still required max A to fully lower B LE's off bed side. Pt BP was assessed in sitting to be 176/90. After 2 min, BP reassessed to be 150/96 with nursing staff notified that teds may no longer be needed. PT initiated lateral scoot transfer with slide board from bed>w/c using 3 inch block providing mod A for transfer and board placement with slide board transfer taking increased time to perform. Pt was positioned in chair with towel roll to lumbar region for reducing sacral pressure. Pt was positioned at sink at end of session for brushing her teeth with all needs met and tray table in reach.   Therapy Documentation Precautions:  Precautions Precautions: Fall Precaution Comments: throracic region, LEs spasicity  Restrictions Weight Bearing Restrictions: Yes LLE Weight Bearing: Touchdown weight bearing Other Position/Activity Restrictions: LLE NWB   See Function Navigator for Current Functional Status.   Therapy/Group: Individual Therapy  Floreen Comber 10/07/2017, 12:10 PM

## 2017-10-07 NOTE — Progress Notes (Signed)
Occupational Therapy Session Note  Patient Details  Name: Sarah [name] MRN: [id number] Date of Birth: 1966-01-20  Today's Date: 10/07/2017  Session 1 OT Individual Time: 1130-1200 OT Individual Time Calculation (min): 30 min   Session 2 OT Individual Time: 4103-0131 OT Individual Time Calculation (min): 72 min   Short Term Goals: Week 2:  OT Short Term Goal 1 (Week 2): Pt will transfer to padded tub bench with mod A OT Short Term Goal 2 (Week 2): Pt will maintain dynamic sitting balance in prep for ADL with min A.  OT Short Term Goal 3 (Week 2): Pt will don LB clothing with mod A from bed level.  Skilled Therapeutic Interventions/Progress Updates:   Session 1  Pt greeted seim-reclined in bed, continues to report nausea, but agreeable to try to participate in OT. OT donned pt's shoes in bed, then pt utilized leg lifter to advance LLE off of bed, but needed assist for R LE. Min A to then elevate trunk. Pt needed extended rest break at EOB 2/2 nausea but did not get sick. Stool placed under LE's for traction, then pt needed min A to place board correctly. Mod A for SB transfer to wc. Pt felt nauseas again after transfer and was given emesis bag, but did not throw up. Pt set-up for lunch and left with call bell in reach and needs met.   Session 2 Pt greeted seated in wc and agreeable to OT. Pt states much improved nausea since this AM. UB strengthening with wc propulsion to therapy gym. Slideboard transfer to even surface of therapy mat with mod A and facilitation for head/hips relationship. Worked on obliques with core strengthening activity using 1 lb medicine ball. Progressed to seated ball toss with rec therapist while OT provided facilitation at hips for weight shift and trunk stability. 4 sets of 20 ball tosses. Addressed lateral weight shift with cards placed under buttocks and hip to simulate clothing management and toileting strategies. SB transfer back to wc at end of session with Mod  and and focus on head hips relationship. Pt left seated in wc at end of session with nursing present.   Therapy Documentation Precautions:  Precautions Precautions: Fall Precaution Comments: throracic region, LEs spasicity  Restrictions Weight Bearing Restrictions: Yes LLE Weight Bearing: Touchdown weight bearing Other Position/Activity Restrictions: LLE NWB Pain:  none/denies pain ADL: ADL ADL Comments: see functional navigator  See Function Navigator for Current Functional Status.  Therapy/Group: Individual Therapy  Valma Cava 10/07/2017, 3:34 PM

## 2017-10-07 NOTE — Evaluation (Signed)
Recreational Therapy Assessment and Plan  Patient Details  Name: Sarah [name] MRN: [id number] Date of Birth: [birth year] Today's Date: 10/07/2017  Rehab Potential:  Good ELOS:   2 weeks Assessment Problem List:      Patient Active Problem List   Diagnosis Date Noted  . Myelopathy ([place]) 09/21/2017  . Muscle spasms of both lower extremities   . Closed fracture of shaft of left femur ([place])   . Acute blood loss anemia   . Hypothyroidism   . Nausea & vomiting 09/15/2017  . Closed fracture of left distal femur ([place]) 09/11/2017  . Paraplegia ([place]) 09/08/2017  . Obesity (BMI 30-39.9) 09/03/2017  . Status post surgery 09/03/2017  . Calculus of gallbladder without cholecystitis without obstruction   . Biliary dyskinesia   . Intractable nausea and vomiting 08/09/2017  . Thrombocytopenia ([place]) 08/09/2017  . Elevated bilirubin 08/09/2017  . AKI (acute kidney injury) ([place]) 08/09/2017  . Nasal congestion 04/05/2017  . Breast pain 01/26/2017  . Upper respiratory infection, viral 01/26/2017  . Counseling regarding advanced care planning and goals of care 01/02/2017  . Bilateral rotator cuff syndrome 02/12/2016  . E. coli UTI 08/01/2015  . Constipation due to neurogenic bowel 08/01/2015  . Muscle spasticity   . 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 Spastic paraparesis   . Neurogenic bladder   . History of pulmonary embolism   . Post-operative pain   . Metastatic cancer ([place])   . Steroid-induced hyperglycemia   . Depression   . Obstipation   . Thoracic spine tumor 07/06/2015  . 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 ([place]) 07/06/2015  . Spinal cord compression due to malignant neoplasm metastatic to spine ([place])   . Postoperative hypothyroidism   . 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 Spasticity 09/19/2014  . Dysuria 09/04/2014  . Hypertension 05/01/2014  . Ingrown right big toenail 05/01/2014  . GERD (gastroesophageal reflux  disease) 02/14/2014  . Dysphagia, pharyngoesophageal phase 02/06/2014  . Type 2 diabetes mellitus without complication ([place]) 16/60/6301  . Constipation   . Neurogenic bowel   . Paraplegia at T4 level ([place]) 01/20/2014  . Metastatic cancer to spine ([place]) 01/19/2014  . Rotator cuff tendonitis   . 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 Hurthle cell carcinoma of thyroid ([place]) 12/29/2013  . Leg weakness, bilateral 12/13/2013    Past Medical History:      Past Medical History:  Diagnosis Date  . Cancer [facility])    FNA of thyroid positive for onconytic/hurthle cell carcinoma  . Chronic back pain   . DDD (degenerative disc disease), cervical   . DJD (degenerative joint disease)   . Family history of adverse reaction to anesthesia    MOTHER HAS NAUSEA   . History of rectal fissure   . HIT (heparin-induced thrombocytopenia) ([place])   . Hypertension   . Obesity   . Paraplegia at T4 level ([place])   . Pulmonary embolus, right ([place]) 2015  . Rotator cuff tendonitis right   Past Surgical History:       Past Surgical History:  Procedure Laterality Date  . FEMUR IM NAIL Left 09/11/2017   Procedure: INTRAMEDULLARY (IM) RETROGRADE FEMORAL NAILING;  Surgeon: Altamese [place], MD;  Location: [facility];  Service: Orthopedics;  Laterality: Left;  . LAMINECTOMY N/A 12/14/2013   Procedure: THORACIC LAMINECTOMY FOR TUMOR THORACIC THREE;  Surgeon: Ashok Pall, MD;  Location: [place] [facility];  Service: Neurosurgery;  Laterality: N/A;  THORACIC LAMINECTOMY FOR TUMOR THORACIC THREE  .  LAMINECTOMY N/A 07/05/2015   Procedure: THORACIC LAMINECTOMY FOR TUMOR;  Surgeon: Ashok Pall, MD;  Location: [facility];  Service: Neurosurgery;  Laterality: N/A;  . LAMINECTOMY N/A 09/03/2017   Procedure: POSTERIOR SPINAL TUMOR RESECTION THORACIC THREE;  Surgeon: Ashok Pall, MD;  Location: [facility];  Service: Neurosurgery;  Laterality: N/A;  POSTERIOR SPINAL TUMOR RESECTION THORACIC THREE  . POSTERIOR LUMBAR  FUSION 4 LEVEL N/A 12/30/2013   Procedure: Thoracic one-Thoracic five posterior thoracic fusion with pedicle screws;  Surgeon: Ashok Pall, MD;  Location: [place];  Service: Neurosurgery;  Laterality: N/A;  Thoracic one-Thoracic five posterior thoracic fusion with pedicle screws  . THYROIDECTOMY N/A 01/09/2014   Procedure: TOTAL THYROIDECTOMY;  Surgeon: Izora Gala, MD;  Location: [facility];  Service: ENT;  Laterality: N/A;  . TONSILLECTOMY      Assessment & Plan Clinical Impression: [name] a 52 y.o. 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 Per chart review she lives with sister and brother-in-law. One level home with ramped entrance. Up until recently she was using a sliding board for transfers and ambulating short distances with a rolling walker up until May 2019. She had been using a wheelchair most the time for the past few years otherwise. She has had 2 previous back surgeries T3 in 2015 and 2017 he did receive inpatient rehab services. Presented 09/02/2017 with recent fall increasing leg weakness over the past 2 weeks. She was to see her oncologist 08/24/2017 for follow-up MRI that was planned however could not make the appointment due to lower extremity weakness. She did also have some urinary incontinence with noted neurogenic bladder. MRI and imaging revealed tumor recurrence at T3 with severe spinal cord compression. Underwent posterior spinal tumor resection T3 09/03/2017 per Dr. Christella Noa. Radiation oncology follow-up Dr. Dara Lords to follow-up outpatient for plan radiation therapy. Decadron protocol was indicated. Hospital course anemia and monitored. E. coli UTI completed a course of Rocephin. Physical and occupational therapy evaluations completed and patient was admitted for a comprehensive rehab program 09/08/2017. Follow-up labs on  admission to rehab services showed hemoglobin 6.2 that was confirmed and patient was transfused. Patient's initial plan was to resume Xarelto for history of pulmonary emboli but held due to anemia. CT of lumbar and thoracic spine as well as pelvis were unremarkable. Hemoglobin rebounded 8.7. Patient with ongoing complaints of left knee pain and some effusion. X-rays and imaging revealed left femoral shaft fracture likely felt to be subacute with noted history of recent fall. Orthopedic services consulted she was discharged to acute care services and underwent intramedullary retrograde femoral nailing 09/11/2017 per Dr. Marcelino Scot. Nonweightbearing left lower extremity. Her Xarelto was resumed postoperatively. Close monitoring of hemoglobin and hematocrit. Physical and Occupational Therapy evaluations completed postoperatively 6/22 and patient was admitted to inpatient rehab services 09/15/2017. Upon admission to inpatient rehab services patient with intractable nausea vomiting noted bouts of constipation. She was again discharged back to acute care services. Abdominal film showed nonobstructive bowel gas pattern. Moderate colorectal stool volume indicating constipation. Ultrasound the abdomen showed cholelithiasis without complicating factors. Bowel program was established. Nausea vomiting greatly improved.   Patient transferred to CIR on 09/21/2017 .   Familiar with pt from previous CIR admissions.  Pt presents with decreased activity tolerance, decreased functional mobility, decreased balance Limiting pt's independence with leisure/community pursuits.  Plan  Min 1 TR session >20 minutes during LOS  Recommendations for other services: None   Discharge Criteria: Patient will be discharged from TR if patient refuses treatment 3 consecutive times without medical reason.  If treatment goals not met, if there is a change in medical status, if patient makes no progress towards goals or if patient is  discharged from hospital.  The above assessment, treatment plan, treatment alternatives and goals were discussed and mutually agreed upon: by patient  [place] 10/07/2017, 4:05 PM

## 2017-10-07 NOTE — Patient Care Conference (Signed)
Inpatient RehabilitationTeam Conference and Plan of Care Update Date: 10/06/2017   Time: 2:05 PM    Patient Name: Sarah [name]      Medical Record Number: [id number]  Date of Birth: 08/19/1965 Sex: Female         Room/Bed: 4W18C/4W18C-01 Payor Info: Payor: [employer] MEDICARE / Plan: [facility] MEDICARE / Product Type: *No Product type* /    Admitting Diagnosis: T4 Para  Admit Date/Time:  09/21/2017  4:57 PM Admission Comments: No comment available   Primary Diagnosis:  <principal problem not specified> Principal Problem: <principal problem not specified>  Patient Active Problem List   Diagnosis Date Noted  . Myelopathy ([place]) 09/21/2017  . Muscle spasms of both lower extremities   . Closed fracture of shaft of left femur ([place])   . Acute blood loss anemia   . Hypothyroidism   . Nausea & vomiting 09/15/2017  . Closed fracture of left distal femur ([place]) 09/11/2017  . Paraplegia ([place]) 09/08/2017  . Obesity (BMI 30-39.9) 09/03/2017  . Status post surgery 09/03/2017  . Calculus of gallbladder without cholecystitis without obstruction   . Biliary dyskinesia   . Intractable nausea and vomiting 08/09/2017  . Thrombocytopenia ([place]) 08/09/2017  . Elevated bilirubin 08/09/2017  . AKI (acute kidney injury) ([place]) 08/09/2017  . Nasal congestion 04/05/2017  . Breast pain 01/26/2017  . Upper respiratory infection, viral 01/26/2017  . Counseling regarding advanced care planning and goals of care 01/02/2017  . Bilateral rotator cuff syndrome 02/12/2016  . E. coli UTI 08/01/2015  . Constipation due to neurogenic bowel 08/01/2015  . Muscle spasticity   . 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 Spastic paraparesis   . Neurogenic bladder   . History of pulmonary embolism   . Post-operative pain   . Metastatic cancer ([place])   . Steroid-induced hyperglycemia   . Depression   . Obstipation   . Thoracic spine tumor 07/06/2015  . 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 Spinal  cord compression due to malignant neoplasm metastatic to spine ([place])   . Postoperative hypothyroidism   . 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 Spasticity 09/19/2014  . Dysuria 09/04/2014  . Hypertension 05/01/2014  . Ingrown right big toenail 05/01/2014  . GERD (gastroesophageal reflux disease) 02/14/2014  . Dysphagia, pharyngoesophageal phase 02/06/2014  . Type 2 diabetes mellitus without complication ([place]) 29/47/6546  . Constipation   . Neurogenic bowel   . Paraplegia at T4 level ([place]) 01/20/2014  . Metastatic cancer to spine ([place]) 01/19/2014  . Rotator cuff tendonitis   . 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 Leg weakness, bilateral 12/13/2013    Expected Discharge Date: Expected Discharge Date: 10/15/17  Team Members Present: Physician leading conference: Dr. Alger Simons Social Worker Present: Lennart Pall, LCSW Nurse Present: [name] Maywood, RN PT Present: Kem Parkinson, PT OT Present: Benay Pillow, OT PPS Coordinator present : Daiva Nakayama, RN, CRRN     Current Status/Progress Goal Weekly Team Focus  Medical   no motor/sensory changes. working on scheduled caths and decreasing incontinence, pain improving  see prior  bowel, bladder, pain, rx uti   Bowel/Bladder   Incontinent of B?B LBM 7/16 Patient takes suppository PRN   able to manage bowel an bladder with assistance   timed toileting assess bowel pattern q shift suppostitory PRN    Swallow/Nutrition/ Hydration   Regualr Diet; thin liquids   monitor  ADL's   Mod A SB transfer, worked on lateral leans for toileting but still needs assistance to advance clothing, max A LB dressing using LH AE bed level, set-up A UB bathing/dressing  Set-up supervision  pt/family education, bed level modified bathing/dressing, toileting strategies, core strengthening, endurance, strengthening, transfers   Mobility   modI w/c  management, modA bed mobility, modA slideboard transfers  CGA for slide board transfer and min A for car transfer; mod I for w/c propulsion. May downgrade to min A overall  uneven transfers, bed mobility, LE management, LE ROM, UE strength, activity tolerance   Communication             Safety/Cognition/ Behavioral Observations            Pain   Pt has Tylenol & Tramadol PRN back pain   Pt will have pain that is </= 2/10   assess for ain q shift and PRN    Skin   MASD to bottom healed; weeping BLE (abdominal pads & kerlix; scabbed area top of rt thigh; thoracic spine surgical site OTA; shearign to buttock   no s/sx of onfection; improvement of current skin issue ; no further skin breakdown   assess skin q shift and PRN; continue Tx as ordered     Rehab Goals Patient on target to meet rehab goals: Yes *See Care Plan and progress notes for long and short-term goals.     Barriers to Discharge  Current Status/Progress Possible Resolutions Date Resolved   Physician    Medical stability;Neurogenic Bowel & Bladder        see medical progress notes      Nursing                  PT                    OT                  SLP                SW                Discharge Planning/Teaching Needs:  Plan home with family who can provide only intermittent assistance.  Teaching needs still to be determined.   Team Discussion:  Still with some incontinence; no neuro improvements.  B/d at bed level with AE.  Min assist rolling then independent with dsg.  Still trying to get leg loops that fit.  Slide board tfs at mod assist overall.  Team feels she could be left alone safely for a few hours if she has foley - pt aware.  Revisions to Treatment Plan:  None    Continued Need for Acute Rehabilitation Level of Care: The patient requires daily medical management by a physician with specialized training in physical medicine and rehabilitation for the following conditions: Daily direction of a  multidisciplinary physical rehabilitation program to ensure safe treatment while eliciting the highest outcome that is of practical value to the patient.: Yes Daily medical management of patient stability for increased activity during participation in an intensive rehabilitation regime.: Yes Daily analysis of laboratory values and/or radiology reports with any subsequent need for medication adjustment of medical intervention for : Neurological problems;Urological problems  Reshma Hoey 10/07/2017, 2:14 PM

## 2017-10-08 ENCOUNTER — Inpatient Hospital Stay (HOSPITAL_COMMUNITY): Payer: Medicare Other | Admitting: Occupational Therapy

## 2017-10-08 ENCOUNTER — Inpatient Hospital Stay (HOSPITAL_COMMUNITY): Payer: Medicare Other | Admitting: Physical Therapy

## 2017-10-08 MED ORDER — SALINE SPRAY 0.65 % NA SOLN
1.0000 | NASAL | Status: DC | PRN
  Filled 2017-10-08: qty 44

## 2017-10-08 NOTE — Plan of Care (Signed)
  Problem: Consults Goal: RH SPINAL CORD INJURY PATIENT EDUCATION Description  See Patient Education module for education specifics.  Outcome: Progressing Goal: Skin Care Protocol Initiated - if Braden Score 18 or less Description If consults are not indicated, leave blank or document N/A Outcome: Progressing Goal: Nutrition Consult-if indicated Outcome: Progressing   Problem: SCI BOWEL ELIMINATION Goal: RH STG MANAGE BOWEL WITH ASSISTANCE Description STG Manage Bowel with mod Assistance.  Outcome: Progressing Goal: RH STG SCI MANAGE BOWEL WITH MEDICATION WITH ASSISTANCE Description STG SCI Manage bowel with medication with mod assistance.  Outcome: Progressing Goal: RH STG MANAGE BOWEL W/EQUIPMENT W/ASSISTANCE Description STG Manage Bowel With Equipment With mod Assistance  Outcome: Progressing Goal: RH STG SCI MANAGE BOWEL PROGRAM W/ASSIST OR AS APPROPRIATE Description STG SCI Manage bowel program w/assist mod   Outcome: Progressing   Problem: SCI BLADDER ELIMINATION Goal: RH STG MANAGE BLADDER WITH ASSISTANCE Description STG Manage Bladder With mod Assistance  Outcome: Progressing   Problem: RH SKIN INTEGRITY Goal: RH STG SKIN FREE OF INFECTION/BREAKDOWN Description Skin wound healing and no new skin breakdown with min assistance  Outcome: Progressing Goal: RH STG MAINTAIN SKIN INTEGRITY WITH ASSISTANCE Description STG Maintain Skin Integrity With min Assistance.  Outcome: Progressing Goal: RH STG ABLE TO PERFORM INCISION/WOUND CARE W/ASSISTANCE Description STG Able To Perform Incision/Wound Care With mod Assistance.  Outcome: Progressing   Problem: RH SAFETY Goal: RH STG ADHERE TO SAFETY PRECAUTIONS W/ASSISTANCE/DEVICE Description STG Adhere to Safety Precautions With min Assistance/Device.  Outcome: Progressing   Problem: RH PAIN MANAGEMENT Goal: RH STG PAIN MANAGED AT OR BELOW PT'S PAIN GOAL Description Pain < or = 4 with min assistance  Outcome:  Progressing   Problem: RH KNOWLEDGE DEFICIT SCI Goal: RH STG INCREASE KNOWLEDGE OF SELF CARE AFTER SCI Description Patient able to verbalize safe care activities with min assistance   Outcome: Progressing

## 2017-10-08 NOTE — Progress Notes (Signed)
Physical Therapy Session Note  Patient Details  Name: Sarah [name] MRN: [id number] Date of Birth: 01/20/1966  Today's Date: 10/08/2017 PT Individual Time: 1400-1500 PT Individual Time Calculation (min): 60 min   Short Term Goals: Week 2:  PT Short Term Goal 1 (Week 2): Pt will perform bed mobility with min A  PT Short Term Goal 2 (Week 2): Pt will perform car transfer with mod A +1 PT Short Term Goal 3 (Week 2): Pt will be able to verbalize and correctly demonstrate pressure relief in her w/c with mod I PT Short Term Goal 4 (Week 2): Pt will be able to appropriate place transfer board in preparation for lateral scoot transfers with supervision  Skilled Therapeutic Interventions/Progress Updates:    Pt reported slight discomfort to upper/middle region of back prior to treatment session onset. Pt propelled w/c using B UE's for >1000 feet to gift shop. PT initiated training with w/c propulsion incorporating navigating tight spaces, community reintegration, dynamic sitting balance with functional reaching, dual tasking, and initiating transactions for purchasing items. PT initiated upper extremity strengthening for 3x12 reps with 5# wt including bicep curls, 3x/5 reps shoulder flexion, and 12 reps of w/c push ups. Pt was returned to room with tray table and call bell in reach and all needs met.   Therapy Documentation Precautions:  Precautions Precautions: Fall Precaution Comments: throracic region, LEs spasicity  Restrictions Weight Bearing Restrictions: Yes LLE Weight Bearing: Touchdown weight bearing Other Position/Activity Restrictions: LLE NWB   See Function Navigator for Current Functional Status.   Therapy/Group: Individual Therapy  Cara Poole 10/08/2017, 3:00 PM  

## 2017-10-08 NOTE — Progress Notes (Signed)
Attempted to start teaching patient how to self cath, however I am limited due to patients intense leg spasms interfering with her visualization of her anatomy.  Also discussed patients bowel program which now consists of miralax and dulcolax suppository.  Patient still having multiple incontinent smears throughout the day indicating that she may not be emptying totally with current regimen.  Discussed trying digital stimulation along with current plan and patient is agreeable.  Will discuss with night shift nurse to follow up with dig. stim since suppository just administered.  Will continue to educate on self care routine.  Brita Romp, RN

## 2017-10-08 NOTE — Progress Notes (Signed)
Subjective/Complaints:  Feels that she's having frontal sinus pressure/headaches.   ROS: Patient denies fever, rash, sore throat, blurred vision, nausea, vomiting, diarrhea, cough, shortness of breath or chest pain, joint or back pain, headache, or mood change.   . .    Objective: Vital Signs: Blood pressure 136/87, pulse 98, temperature 99.7 F (37.6 C), temperature source Oral, resp. rate 17, height _0  (1.803 m), weight 103.3 kg (227 lb 13.2 oz), last menstrual period 10/22/2016, SpO2 100 %. No results found. Results for orders placed or performed during the hospital encounter of 09/21/17 (from the past 72 hour(s))  Basic metabolic panel     Status: Abnormal   Collection Time: 10/06/17  5:45 AM  Result Value Ref Range   Sodium 139 135 - 145 mmol/L   Potassium 4.2 3.5 - 5.1 mmol/L   Chloride 103 98 - 111 mmol/L    Comment: Please note change in reference range.   CO2 31 22 - 32 mmol/L   Glucose, Bld 105 (H) 70 - 99 mg/dL    Comment: Please note change in reference range.   BUN 8 6 - 20 mg/dL    Comment: Please note change in reference range.   Creatinine, Ser 0.84 0.44 - 1.00 mg/dL   Calcium 7.8 (L) 8.9 - 10.3 mg/dL   GFR calc non Af Amer >60 >60 mL/min   GFR calc Af Amer >60 >60 mL/min    Comment: (NOTE) The eGFR has been calculated using the CKD EPI equation. This calculation has not been validated in all clinical situations. eGFR's persistently <60 mL/min signify possible Chronic Kidney Disease.    Anion gap 5 5 - 15    Comment: Performed at [facility] [address], [place], [place] [address]  CBC     Status: Abnormal   Collection Time: 10/06/17  5:45 AM  Result Value Ref Range   WBC 2.2 (L) 4.0 - 10.5 K/uL   RBC 2.43 (L) 3.87 - 5.11 MIL/uL   Hemoglobin 7.8 (L) 12.0 - 15.0 g/dL   HCT 25.2 (L) 36.0 - 46.0 %   MCV 103.7 (H) 78.0 - 100.0 fL   MCH 32.1 26.0 - 34.0 pg   MCHC 31.0 30.0 - 36.0 g/dL   RDW 19.7 (H) 11.5 - 15.5 %   Platelets 82 (L) 150 -  400 K/uL    Comment: SPECIMEN CHECKED FOR CLOTS PLATELET COUNT CONFIRMED BY SMEAR Performed at [facility], [address] [place], [place] [address]   Glucose, capillary     Status: None   Collection Time: 10/06/17 11:21 AM  Result Value Ref Range   Glucose-Capillary 89 70 - 99 mg/dL   *Note: Due to a large number of results and/or encounters for the requested time period, some results have not been displayed. A complete set of results can be found in Results Review.     Constitutional: No distress . Vital signs reviewed. HEENT: EOMI, oral membranes moist Neck: supple Cardiovascular: RRR without murmur. No JVD    Respiratory: CTA Bilaterally without wheezes or rales. Normal effort    GI: BS +, non-tender, non-distended  Neuro: Alert/Oriented, Abnormal Sensory absent motor and sensory below T2 and Abnormal Motor 5/5 BUE, 0/5 BLE, mild extensor tone while in w/c--stable exam Musc/Skel:  Other no pain with UE AROM, no pain with LE PROM Psych: pleasant Skin: surgical wound healing and intact   Assessment/Plan: 1. Functional deficits secondary to T3 paraplegia which require 3+ hours per  day of interdisciplinary therapy in a comprehensive inpatient rehab setting. Physiatrist is providing close team supervision and 24 hour management of active medical problems listed below. Physiatrist and rehab team continue to assess barriers to discharge/monitor patient progress toward functional and medical goals. FIM: Function - Bathing Position: Bed Body parts bathed by patient: Right arm, Left arm, Chest, Abdomen, Right upper leg, Left upper leg, Right lower leg, Left lower leg, Front perineal area Body parts bathed by helper: Buttocks Bathing not applicable: Front perineal area, Buttocks Assist Level: Touching or steadying assistance(Pt > 75%), Assistive device Assistive Device Comment: LH sponge and reacher  Function- Upper Body Dressing/Undressing What is the patient wearing?: Pull  over shirt/dress, Bra Bra - Perfomed by patient: Thread/unthread right bra strap, Thread/unthread left bra strap, Hook/unhook bra (pull down sports bra) Bra - Perfomed by helper: Hook/unhook bra (pull down sports bra) Pull over shirt/dress - Perfomed by patient: Thread/unthread right sleeve, Thread/unthread left sleeve, Put head through opening, Pull shirt over trunk Pull over shirt/dress - Perfomed by helper: Pull shirt over trunk Assist Level: Set up, Supervision or verbal cues Set up : To obtain clothing/put away Function - Lower Body Dressing/Undressing What is the patient wearing?: Pants, Maryln Manuel, Shoes Position: Bed Pants- Performed by patient: Thread/unthread right pants leg, Thread/unthread left pants leg, Pull pants up/down Pants- Performed by helper: Pull pants up/down Shoes - Performed by helper: Don/doff right shoe, Don/doff left shoe, Fasten right, Fasten left TED Hose - Performed by helper: Don/doff right TED hose, Don/doff left TED hose Assist for footwear: Supervision/touching assist Assist for lower body dressing: Touching or steadying assistance (Pt > 75%)  Function - Toileting Toileting activity did not occur: No continent bowel/bladder event Toileting steps completed by patient: Adjust clothing prior to toileting, Performs perineal hygiene, Adjust clothing after toileting(per Jobelle Mesias, NT) Toileting steps completed by helper: Adjust clothing after toileting Assist level: Two helpers  Function - Air cabin crew transfer activity did not occur: Safety/medical concerns Assist level to toilet: 2 helpers(per Wyvonnia Lora, NT) Assist level from toilet: 2 helpers  Function - Chair/bed transfer Chair/bed transfer method: Lateral scoot Chair/bed transfer assist level: Moderate assist (Pt 50 - 74%/lift or lower) Chair/bed transfer assistive device: Sliding board Chair/bed transfer details: Manual facilitation for placement, Manual facilitation for weight  shifting, Verbal cues for technique, Verbal cues for sequencing, Tactile cues for sequencing, Tactile cues for weight shifting  Function - Locomotion: Wheelchair Will patient use wheelchair at discharge?: Yes Type: Manual Max wheelchair distance: 150' Assist Level: No help, No cues, assistive device, takes more than reasonable amount of time Wheel 50 feet with 2 turns activity did not occur: Safety/medical concerns Assist Level: No help, No cues, assistive device, takes more than reasonable amount of time Wheel 150 feet activity did not occur: Safety/medical concerns Assist Level: No help, No cues, assistive device, takes more than reasonable amount of time Turns around,maneuvers to table,bed, and toilet,negotiates 3% grade,maneuvers on rugs and over doorsills: Yes Function - Locomotion: Ambulation Ambulation activity did not occur: Safety/medical concerns Walk 10 feet activity did not occur: Safety/medical concerns Walk 50 feet with 2 turns activity did not occur: Safety/medical concerns Walk 150 feet activity did not occur: Safety/medical concerns Walk 10 feet on uneven surfaces activity did not occur: Safety/medical concerns  Function - Comprehension Comprehension: Auditory Comprehension assist level: Understands complex 90% of the time/cues 10% of the time  Function - Expression Expression: Verbal Expression assist level: Expresses complex ideas: With extra time/assistive device  Function - Social Interaction Social Interaction assist level: Interacts appropriately with others with medication or extra time (anti-anxiety, antidepressant).  Function - Problem Solving Problem solving assist level: Solves complex 90% of the time/cues < 10% of the time  Function - Memory Memory assist level: Complete Independence: No helper Patient normally able to recall (first 3 days only): Current season, That he or she is in a hospital, Location of own room, Staff names and faces   Medical  Problem List and Plan: 1.  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    -continues to progress toward goals    2.  DVT Prophylaxis/Anticoagulation/history of pulmonary emboli: Xarelto.  Monitor for any bleeding episodes 3. Pain Management: Baclofen 20 mg nightly,  -continue tramadol for more severe pain  -continue tylenol prn 4. Mood: Zoloft 25 mg daily.  Provide emotional support 5. Neuropsych: This patient is capable of making decisions on her own behalf. 6. Skin/Wound Care: Routine skin checks, surgical sites CDI 7. Fluids/Electrolytes/Nutrition: check labs tomorrow 8.  Left femoral shaft fracture.  Status post intramedullary retrograde nailing 09/11/2017.  Nonweightbearing. 9.  Acute blood loss anemia.  Follow-up CBC 7/2 , stable 10.  Hyperglycemia secondary to steroid.  improved  -dc'ed cbg checks and SSI 11.  Neurogenic bowel/ bladder.    -  PM bowel program with some improvement  -increased ditropan to TID  -100k Ecoli: macrobid initiated 7/11--->7 day course--abx completed -targeting a cath schedule which keeps her dry during the day  -may be home alone from 7am to 1pm a few days a week    -ration fluids vs foley once home 12.  Hypothyroidism.  Synthroid 13.  Mild thrombocytopenia - monitor 14.  Sinus symptoms, flonase, add ocean nasal spray today  LOS (Days) 17 A FACE TO FACE EVALUATION WAS PERFORMED  [name] 10/08/2017, 10:47 AM

## 2017-10-08 NOTE — Progress Notes (Signed)
Occupational Therapy Weekly Progress Note  Patient Details  Name: Sarah [name] MRN: [id number] Date of Birth: [birth year]  Beginning of progress report period: September 28, 2017 End of progress report period: October 08, 2017  Today's Date: 10/08/2017 OT Individual Time: 3833-3832 OT Individual Time Calculation (min): 60 min    Patient has met 2 of 3 short term goals.  Pt has been progressing well with transfers (mod A with slide board) but has not had the opportunity to try the transfer to an open seat tub bench but will work on that this week.  She has improved with her sitting balance (min A) and ability to don clothing from the bed (min-mod A). Pt needs to continue to focus on transfers and lateral leans to enable her to accomplish toileting tasks with supervision.    Patient continues to demonstrate the following deficits: muscle weakness, muscle joint tightness and muscle paralysis, unbalanced muscle activation and decreased sitting balance and therefore will continue to benefit from skilled OT intervention to enhance overall performance with BADL.  Patient progressing toward long term goals..  Continue plan of care.  OT Short Term Goals Week 1:  OT Short Term Goal 1 (Week 1): Pt will transfer to padded tub bench for toileting with mod A  OT Short Term Goal 1 - Progress (Week 1): Not met OT Short Term Goal 2 (Week 1): Pt will maintain static sitting balance with one UE support with supervision  OT Short Term Goal 2 - Progress (Week 1): Met OT Short Term Goal 3 (Week 1): Pt will maintain dynamic sitting balance in prep for ADL with min A  OT Short Term Goal 3 - Progress (Week 1): Partly met Week 2:  OT Short Term Goal 1 (Week 2): Pt will transfer to padded tub bench with mod A OT Short Term Goal 1 - Progress (Week 2): Progressing toward goal OT Short Term Goal 2 (Week 2): Pt will maintain dynamic sitting balance in prep for ADL with min A.  OT Short Term Goal 2 - Progress (Week 2): Met OT  Short Term Goal 3 (Week 2): Pt will don LB clothing with mod A from bed level. OT Short Term Goal 3 - Progress (Week 2): Met Week 3:  OT Short Term Goal 1 (Week 3): STGs = LTGs  Skilled Therapeutic Interventions/Progress Updates:    Pt seen for BADL training with a focus on adapted techniques for LB bathing and dressing using cross legged method, long sponge and reacher.  Pt did well with cues and touching A for set up of legs.  LE very tight, PROM for B hip and knees.  Pt then worked on rolling completely onto R side using leg loops for A.  sidelying to sit with mod A.  Board transfer to w/c with mod A.  Pt sat up in w/c to complete UB self care independently.   Therapy Documentation Precautions:  Precautions Precautions: Fall Precaution Comments: throracic region, LEs spasicity  Restrictions Weight Bearing Restrictions: Yes LLE Weight Bearing: Touchdown weight bearing Other Position/Activity Restrictions: LLE NWB General: General OT Amount of Missed Time: 15 Minutes - nursing care Pain:  no complaints of pain except when sitting and leaning forward, pain in abdomen ADL: ADL ADL Comments: see functional navigator  See Function Navigator for Current Functional Status.   Therapy/Group: Individual Therapy  [place] 10/08/2017, 1:14 PM

## 2017-10-08 NOTE — Progress Notes (Signed)
Physical Therapy Session Note  Patient Details  Name: Sarah [name] MRN: [id number] Date of Birth: [birth year]  Today's Date: 10/08/2017 PT Individual Time: 1100-1150 PT Individual Time Calculation (min): 50 min   Short Term Goals: Week 2:  PT Short Term Goal 1 (Week 2): Pt will perform bed mobility with min A  PT Short Term Goal 2 (Week 2): Pt will perform car transfer with mod A +1 PT Short Term Goal 3 (Week 2): Pt will be able to verbalize and correctly demonstrate pressure relief in her w/c with mod I PT Short Term Goal 4 (Week 2): Pt will be able to appropriate place transfer board in preparation for lateral scoot transfers with supervision  Skilled Therapeutic Interventions/Progress Updates:   Pt in w/c at sink, finishing w/ self-care tasks. Pain as detailed below. Pt reports nausea throughout session, made RN aware. Finished brushing and drying hair w/ supervision and verbal cues for technique. Pt self-propelled w/c around room to finish self-care tasks and to/from therapy gym w/ supervision to work on functional endurance. Worked on postural control and dynamic sitting balance at edge of mat. Pt able to set-up w/c for transfer w/o cues, total assist to place slide board, and mod assist to transfer to/from mat on slide board w/ increased time and verbal/tactile/manual cues for technique. Worked on alternating reaching tasks w/ UEs, needs UE support to reach w/ opposite UE. Close supervision overall. Practiced w/c mobility, negotiating cones both forward and backward x2 reps. Returned to room and ended session in w/c, all needs in reach.   Therapy Documentation Precautions:  Precautions Precautions: Fall Precaution Comments: throracic region, LEs spasicity  Restrictions Weight Bearing Restrictions: Yes LLE Weight Bearing: Touchdown weight bearing Other Position/Activity Restrictions: LLE NWB Pain: Pain Assessment Pain Scale: 0-10 Pain Score: 3  Pain Type: Acute pain Pain  Location: Head Pain Orientation: Mid Pain Descriptors / Indicators: Dull Pain Onset: On-going Patients Stated Pain Goal: 0 Pain Intervention(s): Other (Comment)(saline nasal spray)  See Function Navigator for Current Functional Status.   Therapy/Group: Individual Therapy  [name] 10/08/2017, 11:53 AM

## 2017-10-09 ENCOUNTER — Inpatient Hospital Stay (HOSPITAL_COMMUNITY): Payer: Medicare Other

## 2017-10-09 ENCOUNTER — Inpatient Hospital Stay (HOSPITAL_COMMUNITY): Payer: Medicare Other | Admitting: Occupational Therapy

## 2017-10-09 ENCOUNTER — Inpatient Hospital Stay (HOSPITAL_COMMUNITY): Payer: Medicare Other | Admitting: Physical Therapy

## 2017-10-09 DIAGNOSIS — D61818 Other pancytopenia: Secondary | ICD-10-CM

## 2017-10-09 LAB — CBC
HCT: 26.6 % — ABNORMAL LOW (ref 36.0–46.0)
Hemoglobin: 8.4 g/dL — ABNORMAL LOW (ref 12.0–15.0)
MCH: 32.1 pg (ref 26.0–34.0)
MCHC: 31.6 g/dL (ref 30.0–36.0)
MCV: 101.5 fL — ABNORMAL HIGH (ref 78.0–100.0)
Platelets: 104 10*3/uL — ABNORMAL LOW (ref 150–400)
RBC: 2.62 MIL/uL — ABNORMAL LOW (ref 3.87–5.11)
RDW: 18.8 % — ABNORMAL HIGH (ref 11.5–15.5)
WBC: 2.7 10*3/uL — ABNORMAL LOW (ref 4.0–10.5)

## 2017-10-09 MED ORDER — BACLOFEN 5 MG HALF TABLET
ORAL_TABLET | ORAL | Status: AC
  Filled 2017-10-09: qty 1

## 2017-10-09 MED ORDER — BACLOFEN 10 MG PO TABS
10.0000 mg | ORAL_TABLET | Freq: Three times a day (TID) | ORAL | Status: DC
  Administered 2017-10-09: 10 mg via ORAL
  Administered 2017-10-09: 5 mg via ORAL
  Administered 2017-10-09 – 2017-10-11 (×5): 10 mg via ORAL
  Filled 2017-10-09 (×6): qty 1

## 2017-10-09 NOTE — Progress Notes (Signed)
Noted patient left great toe  red and irritated, nailbed discolrated black, thicken with crumbling edge type appearance, w/o drainage. Patient states this occurred a year ago after receiving a pedicure,states she will follow up with MD.

## 2017-10-09 NOTE — Progress Notes (Signed)
Physical Therapy Weekly Progress Note  Patient Details  Name: Sarah [name] MRN: [id number] Date of Birth: 1966/01/18  Beginning of progress report period: October 01, 2017 End of progress report period: October 09, 2017  Today's Date: 10/09/2017 PT Individual Time: 0933-1030 PT Individual Time Calculation (min): 57 min   Patient has met 1 of 4 short term goals. Pt continues to perform slide board transfers consistently with mod A for boosting and min A for board placement. Pt demonstrates increased difficulty performing car transfers with slide board due to transferring on uneven surfaces requiring max A for boosting and LE placement. Pt demonstrates observable signs of anxiety when performing uneven transfers and reports fear of falling forward.Pt continues to improve with bed mobility but continues to require max A for LE management and lifting from side lying>sitting edge of bed. Pt has been demonstrating and verbalizing correct pressure relief in w/c without need for cueing to perform daily. Therapy will continue to progress Pt towards long term goals focusing on postural control, car transfers, and functional mobility to prepare for d/c next week.   Patient continues to demonstrate the following deficits muscle weakness, muscle joint tightness and muscle paralysis, abnormal tone and decreased coordination and decreased sitting balance, decreased postural control and decreased balance strategies and therefore will continue to benefit from skilled PT intervention to increase functional independence with mobility.  Patient progressing toward long term goals..  Continue plan of care.  PT Short Term Goals Week 2:  PT Short Term Goal 1 (Week 2): Pt will perform bed mobility with min A  PT Short Term Goal 1 - Progress (Week 2): Progressing toward goal PT Short Term Goal 2 (Week 2): Pt will perform car transfer with mod A +1 PT Short Term Goal 2 - Progress (Week 2): Progressing toward goal PT Short  Term Goal 3 (Week 2): Pt will be able to verbalize and correctly demonstrate pressure relief in her w/c with mod I PT Short Term Goal 3 - Progress (Week 2): Met PT Short Term Goal 4 (Week 2): Pt will be able to appropriate place transfer board in preparation for lateral scoot transfers with supervision PT Short Term Goal 4 - Progress (Week 2): Progressing toward goal Week 3:  PT Short Term Goal 1 (Week 3): LTG=STG due to ELOS  Skilled Therapeutic Interventions/Progress Updates:    Pt reported no pain prior to treatment session onset. Pt self propelled w/c from room<>treatment gym for 300 feet. Initiated car transfer to uneven surface from w/c with sliding board and 1 inch block. PT provided mod A for board placement and boosting from w/c>car with 2nd helper holding w/c due to L brake not locking properly. When in car, therapist provided min A for LE management as Pt attempted to lift legs into car using leg loops. Car> w/c Pt required mod fading to max A due to transferring to higher uneven surface with 2nd helper steadying w/c. Transfer required increased time to perform with frequent rest breaks. Therapist recommends family education on slide board transfers with sister and brother in law beginning next week. Therapist tightened L lock for increased safety with transfers. Pt propelled w/c back to room and aligned w/c for slide board transfer back to bed. Pt required mod A for boosting and board placement with 2 inch block under LE's. Pt performed sitting edge of bed>supine with mod A for LE management only. Pt performed UE strengthening with 5# weight in bed performing 3x/7-12 reps of bicep flexion and  shoulder flexion. Pt was returned to supine with tray table and call bell in reach and all needs met.   Therapy Documentation Precautions:  Precautions Precautions: Fall Precaution Comments: throracic region, LEs spasicity  Restrictions Weight Bearing Restrictions: Yes LLE Weight Bearing: Touchdown  weight bearing Other Position/Activity Restrictions: LLE NWB   See Function Navigator for Current Functional Status.  Therapy/Group: Individual Therapy  Floreen Comber 10/09/2017, 3:38 PM

## 2017-10-09 NOTE — Progress Notes (Signed)
Subjective/Complaints: No new complaints. RN reports cath trials in w/c are difficult due to HAD tone. Pt concurs  ROS: Patient denies fever, rash, sore throat, blurred vision, nausea, vomiting, diarrhea, cough, shortness of breath or chest pain, joint or back pain, headache, or mood change.    . .    Objective: Vital Signs: Blood pressure 118/81, pulse 77, temperature 97.9 F (36.6 C), temperature source Oral, resp. rate 16, height 5\' 11"  (1.803 m), weight 103.3 kg (227 lb 13.2 oz), last menstrual period 10/22/2016, SpO2 99 %. No results found. Results for orders placed or performed during the hospital encounter of 09/21/17 (from the past 72 hour(s))  Glucose, capillary     Status: None   Collection Time: 10/06/17 11:21 AM  Result Value Ref Range   Glucose-Capillary 89 70 - 99 mg/dL   *Note: Due to a large number of results and/or encounters for the requested time period, some results have not been displayed. A complete set of results can be found in Results Review.     Constitutional: No distress . Vital signs reviewed. obese HEENT: EOMI, oral membranes moist Neck: supple Cardiovascular: RRR without murmur. No JVD    Respiratory: CTA Bilaterally without wheezes or rales. Normal effort    GI: BS +, non-tender, non-distended  Neuro: Alert/Oriented, Abnormal Sensory absent motor and sensory below T2 and Abnormal Motor 5/5 BUE, 0/5 BLE, mild extensor tone while in w/c--stable exam Musc/Skel:  Other no pain with UE AROM, no pain with LE PROM Psych: pleasant Skin: surgical wound healing and intact   Assessment/Plan: 1. Functional deficits secondary to T3 paraplegia which require 3+ hours per day of interdisciplinary therapy in a comprehensive inpatient rehab setting. Physiatrist is providing close team supervision and 24 hour management of active medical problems listed below. Physiatrist and rehab team continue to assess barriers to discharge/monitor patient progress toward  functional and medical goals. FIM: Function - Bathing Position: (LB from bed, UB w/c) Body parts bathed by patient: Right arm, Left arm, Chest, Abdomen, Right upper leg, Left upper leg, Right lower leg, Left lower leg, Front perineal area Body parts bathed by helper: Buttocks Bathing not applicable: Front perineal area, Buttocks Assist Level: Touching or steadying assistance(Pt > 75%), Assistive device Assistive Device Comment: LH sponge and reacher  Function- Upper Body Dressing/Undressing What is the patient wearing?: Pull over shirt/dress, Bra Bra - Perfomed by patient: Thread/unthread right bra strap, Thread/unthread left bra strap, Hook/unhook bra (pull down sports bra) Bra - Perfomed by helper: Hook/unhook bra (pull down sports bra) Pull over shirt/dress - Perfomed by patient: Thread/unthread right sleeve, Thread/unthread left sleeve, Put head through opening, Pull shirt over trunk Pull over shirt/dress - Perfomed by helper: Pull shirt over trunk Assist Level: Set up Set up : To obtain clothing/put away Function - Lower Body Dressing/Undressing What is the patient wearing?: Pants, Maryln Manuel, Shoes Position: Bed Pants- Performed by patient: Thread/unthread right pants leg, Thread/unthread left pants leg Pants- Performed by helper: Pull pants up/down Shoes - Performed by helper: Don/doff right shoe, Don/doff left shoe, Fasten right, Fasten left TED Hose - Performed by helper: Don/doff right TED hose, Don/doff left TED hose Assist for footwear: Supervision/touching assist Assist for lower body dressing: Touching or steadying assistance (Pt > 75%)  Function - Toileting Toileting activity did not occur: No continent bowel/bladder event Toileting steps completed by patient: Adjust clothing prior to toileting Toileting steps completed by helper: Performs perineal hygiene, Adjust clothing after toileting Assist level: Two helpers  Function - Air cabin crew transfer activity  did not occur: Safety/medical concerns Assist level to toilet: 2 helpers(per Wyvonnia Lora, NT) Assist level from toilet: 2 helpers  Function - Chair/bed transfer Chair/bed transfer method: Lateral scoot Chair/bed transfer assist level: Moderate assist (Pt 50 - 74%/lift or lower) Chair/bed transfer assistive device: Sliding board Chair/bed transfer details: Manual facilitation for placement, Manual facilitation for weight shifting, Verbal cues for technique, Tactile cues for sequencing, Tactile cues for weight shifting  Function - Locomotion: Wheelchair Will patient use wheelchair at discharge?: Yes Type: Manual Max wheelchair distance: 300 Assist Level: No help, No cues, assistive device, takes more than reasonable amount of time Wheel 50 feet with 2 turns activity did not occur: Safety/medical concerns Assist Level: No help, No cues, assistive device, takes more than reasonable amount of time Wheel 150 feet activity did not occur: Safety/medical concerns Assist Level: No help, No cues, assistive device, takes more than reasonable amount of time Turns around,maneuvers to table,bed, and toilet,negotiates 3% grade,maneuvers on rugs and over doorsills: Yes Function - Locomotion: Ambulation Ambulation activity did not occur: Safety/medical concerns Walk 10 feet activity did not occur: Safety/medical concerns Walk 50 feet with 2 turns activity did not occur: Safety/medical concerns Walk 150 feet activity did not occur: Safety/medical concerns Walk 10 feet on uneven surfaces activity did not occur: Safety/medical concerns  Function - Comprehension Comprehension: Auditory Comprehension assist level: Understands complex 90% of the time/cues 10% of the time  Function - Expression Expression: Verbal Expression assist level: Expresses complex ideas: With extra time/assistive device  Function - Social Interaction Social Interaction assist level: Interacts appropriately with others with  medication or extra time (anti-anxiety, antidepressant).  Function - Problem Solving Problem solving assist level: Solves complex 90% of the time/cues < 10% of the time  Function - Memory Memory assist level: Complete Independence: No helper Patient normally able to recall (first 3 days only): Current season, That he or she is in a hospital, Location of own room, Staff names and faces   Medical Problem List and Plan: 1.  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    -continues to progress toward goals    2.  DVT Prophylaxis/Anticoagulation/history of pulmonary emboli: Xarelto.  Monitor for any bleeding episodes 3. Pain Management/spasticity:   -increased baclofen to 10mg  TID and 20mg  qhs  -continue tramadol for more severe pain  -continue tylenol prn 4. Mood: Zoloft 25 mg daily.  Provide emotional support 5. Neuropsych: This patient is capable of making decisions on her own behalf. 6. Skin/Wound Care: Routine skin checks, surgical sites CDI 7. Fluids/Electrolytes/Nutrition: check labs tomorrow 8.  Left femoral shaft fracture.  Status post intramedullary retrograde nailing 09/11/2017.  Nonweightbearing. 9.  Acute blood loss anemia.  Follow-up hgb 7.8 on 7/16---repeat CBC today 10.  Hyperglycemia secondary to steroid.  improved  -dc'ed cbg checks and SSI 11.  Neurogenic bowel/ bladder.    - PM bowel program with some improvement  -increased ditropan to TID  -100k Ecoli: macrobid initiated 7/11--->7 day course--abx completed  -RN working on self-cath in w/c---increased baclofen as above to help make process easier  -may be home alone from 7am to 1pm a few days a week    -situational rationing of po fluids in morning vs foley once home 12.  Hypothyroidism.  Synthroid 13. Thrombocytopenia - further drop on 7/16---recheck today 14.  Sinus  symptoms, flonase, added ocean nasal spray   LOS (Days) 18 A FACE TO FACE EVALUATION WAS PERFORMED  [name] 10/09/2017, 10:57 AM

## 2017-10-09 NOTE — Progress Notes (Signed)
Occupational Therapy Session Note  Patient Details  Name: Sarah [name] MRN: [id number] Date of Birth: 1966-03-08  Today's Date: 10/09/2017 OT Individual Time: 0900-0930 and 700-755 OT Individual Time Calculation (min): 30 min (make up) and 55 min   Short Term Goals: Week 3:  OT Short Term Goal 1 (Week 3): STGs = LTGs  Skilled Therapeutic Interventions/Progress Updates:    1;1. Pt awke in bed upon arrival. No pain reported. Pt bathes at bed level using hospital function with min A for rolling B to wash buttocks. Pt completes UB dressing with set up to obtain clothing and LB dressing with A to don teds, shoes and A for rolling to advance pants past hips. OT dons leg loops and pt able to sit up EOB with A for trunk elevation only. Pt completes slide board transfer with MOD A to w/c with stool under feet. Pt demo good head hips relationship. Pt grooms at sink with MOD I. While grooming and talking to MD, OT selects padded TTB for pt to practice transfer in next session. Exited session with pt seated in w/c, set up with breakfast, call light in reach  and MD present in room   1:1. (make up session) pt with no c/o pain agreeable to attempt slide board to TTB. Pt completes transfer to TTB with cut out with MOD A for lifting and encouragement as pt is nervous for transfers. Pt requires MOD +2  Back to w/c as LE/buttocks stuck in hole of TTB cut out and requires increased boost from +2 to get out of hole. OT provided PROM of BLE for internal/external hip rotation as well as hanstring/gastroc stretch for tone management. Exited sessio nwiht pt seated in w/c, call light in reach and all needs met Therapy Documentation Precautions:  Precautions Precautions: Fall Precaution Comments: throracic region, LEs spasicity  Restrictions Weight Bearing Restrictions: Yes LLE Weight Bearing: Touchdown weight bearing Other Position/Activity Restrictions: LLE NWB General:   Vital Signs: Therapy Vitals Temp:  97.9 F (36.6 C) Temp Source: Oral Pulse Rate: 77 Resp: 16 BP: 118/81 Patient Position (if appropriate): Lying Oxygen Therapy SpO2: 99 % O2 Device: Room Air  See Function Navigator for Current Functional Status.   Therapy/Group: Individual Therapy  Tonny Branch 10/09/2017, 7:25 AM

## 2017-10-09 NOTE — Progress Notes (Signed)
Social Work Patient ID: Sarah [name], female   DOB: Dec 13, 1965, 52 y.o.   MRN: [id number]   Have spoken with pt's sister, [name], who is aware that therapies would like to complete real car transfer education.  Have scheduled for sister to be here on Tuesday at 10:00 to do this.  Have alerted therapy team.    Lennart Pall, LCSW

## 2017-10-09 NOTE — Plan of Care (Signed)
  Problem: Consults Goal: RH SPINAL CORD INJURY PATIENT EDUCATION Description  See Patient Education module for education specifics.  Outcome: Progressing   Problem: RH SKIN INTEGRITY Goal: RH STG SKIN FREE OF INFECTION/BREAKDOWN Description Skin wound healing and no new skin breakdown with min assistance  Outcome: Progressing Goal: RH STG MAINTAIN SKIN INTEGRITY WITH ASSISTANCE Description STG Maintain Skin Integrity With min Assistance.  Outcome: Progressing Goal: RH STG ABLE TO PERFORM INCISION/WOUND CARE W/ASSISTANCE Description STG Able To Perform Incision/Wound Care With mod Assistance.  Outcome: Progressing   Problem: RH SAFETY Goal: RH STG ADHERE TO SAFETY PRECAUTIONS W/ASSISTANCE/DEVICE Description STG Adhere to Safety Precautions With min Assistance/Device.  Outcome: Progressing   Problem: RH PAIN MANAGEMENT Goal: RH STG PAIN MANAGED AT OR BELOW PT'S PAIN GOAL Description Pain < or = 4 with min assistance  Outcome: Progressing   Problem: RH KNOWLEDGE DEFICIT SCI Goal: RH STG INCREASE KNOWLEDGE OF SELF CARE AFTER SCI Description Patient able to verbalize safe care activities with min assistance   Outcome: Progressing   Problem: SCI BOWEL ELIMINATION Goal: RH STG MANAGE BOWEL WITH ASSISTANCE Description STG Manage Bowel with mod Assistance.  Outcome: Not Progressing Goal: RH STG SCI MANAGE BOWEL WITH MEDICATION WITH ASSISTANCE Description STG SCI Manage bowel with medication with mod assistance.  Outcome: Not Progressing Goal: RH STG SCI MANAGE BOWEL PROGRAM W/ASSIST OR AS APPROPRIATE Description STG SCI Manage bowel program w/assist mod   Outcome: Not Progressing   Problem: SCI BLADDER ELIMINATION Goal: RH STG MANAGE BLADDER WITH ASSISTANCE Description STG Manage Bladder With mod Assistance  Outcome: Not Progressing   Problem: SCI BOWEL ELIMINATION Goal: RH STG MANAGE BOWEL W/EQUIPMENT W/ASSISTANCE Description STG Manage Bowel With Equipment With  mod Assistance  Outcome: Not Applicable   Patient continues to need max/total assistance for bowel and bladder program.  She is incontinent of bladder and needing intermittent catheterizations for emptying.  Started education for self caths.  Readdressing bowel program as she continues to have frequent incontinent episodes during day.  Will try digital stimulation.  Brita Romp, RN

## 2017-10-09 NOTE — Progress Notes (Signed)
Occupational Therapy Session Note  Patient Details  Name: Sarah [name] MRN: [id number] Date of Birth: 09-18-1965  Today's Date: 10/09/2017 OT Individual Time: 1105-1200 and 27 min OT Individual Time Calculation (min): 55 min   Skilled Therapeutic Interventions/Progress Updates:    Pt greeted supine in bed. ADL needs met. Supine<sit completed with steady assist, leg loops, and increased time! Slideboard<w/c completed with Min-Mod A, increased time, small step, and calming cues. Once in w/c, pt self propelled to two community settings to work on IADL participation and UB strengthening. With min vcs for DME mgt, she ordered a sandwich at [employer] and bought toiletries at the gift shop. Afterwards pt self propelled back to room and was left with RN.   2nd Session 1:1 tx (27 min) Pt greeted in w/c with no c/o pain. Worked on [employer] with use of leg loops for doffing/donning footwear. Pt requires increased time and instruction on adaptive strategies to manage her LEs during functional tasks. Unable to obtain figure 4 with L LE without first propping limb up on bed. Therapist assisted due to fatigue/exertion. We discussed returning to meaningful life roles at home and how to fill her day with therapeutic activity/exercise. Pt is motivated to return to housekeeping responsibilities. Educated her on use of LH dustpan and LH duster for increasing ease of participation. Pt is nervous about transferring with sister due to fall hx. Provided her with emotional support. Reminded pt that sister will be attending family education next week, so they will be able to practice with therapy. At end of session pt was left in w/c with all needs within reach.   Therapy Documentation Precautions:  Precautions Precautions: Fall Precaution Comments: throracic region, LEs spasicity  Restrictions Weight Bearing Restrictions: Yes LLE Weight Bearing: Touchdown weight bearing Other Position/Activity Restrictions: LLE NWB Pain:  "a little" in back. Premedicated. Pain Assessment Pain Scale: 0-10 Pain Score: 0-No pain ADL: ADL ADL Comments: see functional navigator     See Function Navigator for Current Functional Status.   Therapy/Group: Individual Therapy  [place] A Thresea Doble 10/09/2017, 12:36 PM

## 2017-10-10 ENCOUNTER — Inpatient Hospital Stay (HOSPITAL_COMMUNITY): Payer: Medicare Other | Admitting: Occupational Therapy

## 2017-10-10 DIAGNOSIS — D702 Other drug-induced agranulocytosis: Secondary | ICD-10-CM

## 2017-10-10 MED ORDER — LORATADINE 10 MG PO TABS
10.0000 mg | ORAL_TABLET | Freq: Every day | ORAL | Status: DC
  Administered 2017-10-10 – 2017-10-30 (×19): 10 mg via ORAL
  Filled 2017-10-10 (×20): qty 1

## 2017-10-10 MED ORDER — POLYETHYLENE GLYCOL 3350 17 G PO PACK
17.0000 g | PACK | Freq: Every day | ORAL | Status: DC
  Administered 2017-10-10 – 2017-10-29 (×18): 17 g via ORAL
  Filled 2017-10-10 (×18): qty 1

## 2017-10-10 NOTE — Progress Notes (Signed)
Subjective/Complaints:  Pt pleased that she can slightly move legs today  ROS: Patient denies fever, rash, sore throat, blurred vision, nausea, vomiting, diarrhea, cough, shortness of breath or chest pain, joint or back pain, headache, or mood change.    . .    Objective: Vital Signs: Blood pressure 122/87, pulse 77, temperature 98.4 F (36.9 C), temperature source Oral, resp. rate 18, height 5\' 11"  (1.803 m), weight 103.3 kg (227 lb 13.2 oz), last menstrual period 10/22/2016, SpO2 100 %. No results found. Results for orders placed or performed during the hospital encounter of 09/21/17 (from the past 72 hour(s))  CBC     Status: Abnormal   Collection Time: 10/09/17 12:19 PM  Result Value Ref Range   WBC 2.7 (L) 4.0 - 10.5 K/uL   RBC 2.62 (L) 3.87 - 5.11 MIL/uL   Hemoglobin 8.4 (L) 12.0 - 15.0 g/dL   HCT 26.6 (L) 36.0 - 46.0 %   MCV 101.5 (H) 78.0 - 100.0 fL   MCH 32.1 26.0 - 34.0 pg   MCHC 31.6 30.0 - 36.0 g/dL   RDW 18.8 (H) 11.5 - 15.5 %   Platelets 104 (L) 150 - 400 K/uL    Comment: REPEATED TO VERIFY CONSISTENT WITH PREVIOUS RESULT Performed at [facility], [address], [place], [place] [address]    *Note: Due to a large number of results and/or encounters for the requested time period, some results have not been displayed. A complete set of results can be found in Results Review.     Constitutional: No distress . Vital signs reviewed. obese HEENT: EOMI, oral membranes moist Neck: supple Cardiovascular: RRR without murmur. No JVD    Respiratory: CTA Bilaterally without wheezes or rales. Normal effort    GI: BS +, non-tender, non-distended  Neuro: Alert/Oriented, Abnormal Sensory absent motor and sensory below T2 and Abnormal Motor 5/5 BUE, 0/5 BLE, except trace knee ext Bilaterally Musc/Skel:  Other no pain with UE AROM, no pain with LE PROM Psych: pleasant Skin: surgical wound healing and intact   Assessment/Plan: 1. Functional deficits secondary to  T3 paraplegia which require 3+ hours per day of interdisciplinary therapy in a comprehensive inpatient rehab setting. Physiatrist is providing close team supervision and 24 hour management of active medical problems listed below. Physiatrist and rehab team continue to assess barriers to discharge/monitor patient progress toward functional and medical goals. FIM: Function - Bathing Position: (LB from bed, UB w/c) Body parts bathed by patient: Right arm, Left arm, Chest, Abdomen, Right upper leg, Left upper leg, Right lower leg, Left lower leg, Front perineal area Body parts bathed by helper: Buttocks Bathing not applicable: Front perineal area, Buttocks Assist Level: Touching or steadying assistance(Pt > 75%), Assistive device Assistive Device Comment: LH sponge and reacher  Function- Upper Body Dressing/Undressing What is the patient wearing?: Pull over shirt/dress, Bra Bra - Perfomed by patient: Thread/unthread right bra strap, Thread/unthread left bra strap, Hook/unhook bra (pull down sports bra) Bra - Perfomed by helper: Hook/unhook bra (pull down sports bra) Pull over shirt/dress - Perfomed by patient: Thread/unthread right sleeve, Thread/unthread left sleeve, Put head through opening, Pull shirt over trunk Pull over shirt/dress - Perfomed by helper: Pull shirt over trunk Assist Level: Set up Set up : To obtain clothing/put away Function - Lower Body Dressing/Undressing What is the patient wearing?: Pants, Ted Hose, Shoes Position: Bed Pants- Performed by patient: Thread/unthread right pants leg, Thread/unthread left pants leg Pants- Performed by helper: Pull pants up/down  Shoes - Performed by helper: Don/doff right shoe, Don/doff left shoe, Fasten right, Fasten left TED Hose - Performed by helper: Don/doff right TED hose, Don/doff left TED hose Assist for footwear: Supervision/touching assist Assist for lower body dressing: Touching or steadying assistance (Pt > 75%)  Function -  Toileting Toileting activity did not occur: No continent bowel/bladder event Toileting steps completed by patient: Adjust clothing prior to toileting Toileting steps completed by helper: Performs perineal hygiene, Adjust clothing after toileting Assist level: Two helpers  Function - Air cabin crew transfer activity did not occur: Safety/medical concerns Assist level to toilet: 2 helpers(per Wyvonnia Lora, NT) Assist level from toilet: 2 helpers  Function - Chair/bed transfer Chair/bed transfer method: Lateral scoot Chair/bed transfer assist level: Moderate assist (Pt 50 - 74%/lift or lower) Chair/bed transfer assistive device: Sliding board Chair/bed transfer details: Manual facilitation for placement, Manual facilitation for weight shifting, Verbal cues for technique, Tactile cues for sequencing, Tactile cues for weight shifting  Function - Locomotion: Wheelchair Will patient use wheelchair at discharge?: Yes Type: Manual Max wheelchair distance: 300 Assist Level: No help, No cues, assistive device, takes more than reasonable amount of time Wheel 50 feet with 2 turns activity did not occur: Safety/medical concerns Assist Level: No help, No cues, assistive device, takes more than reasonable amount of time Wheel 150 feet activity did not occur: Safety/medical concerns Assist Level: No help, No cues, assistive device, takes more than reasonable amount of time Turns around,maneuvers to table,bed, and toilet,negotiates 3% grade,maneuvers on rugs and over doorsills: Yes Function - Locomotion: Ambulation Ambulation activity did not occur: Safety/medical concerns Walk 10 feet activity did not occur: Safety/medical concerns Walk 50 feet with 2 turns activity did not occur: Safety/medical concerns Walk 150 feet activity did not occur: Safety/medical concerns Walk 10 feet on uneven surfaces activity did not occur: Safety/medical concerns  Function - Comprehension Comprehension:  Auditory Comprehension assist level: Understands complex 90% of the time/cues 10% of the time  Function - Expression Expression: Verbal Expression assist level: Expresses complex ideas: With extra time/assistive device  Function - Social Interaction Social Interaction assist level: Interacts appropriately with others with medication or extra time (anti-anxiety, antidepressant).  Function - Problem Solving Problem solving assist level: Solves complex 90% of the time/cues < 10% of the time  Function - Memory Memory assist level: Complete Independence: No helper Patient normally able to recall (first 3 days only): Current season, That he or she is in a hospital, Location of own room, Staff names and faces   Medical Problem List and Plan: 1.  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    -continues to progress toward goals    2.  DVT Prophylaxis/Anticoagulation/history of pulmonary emboli: Xarelto.  Monitor for any bleeding episodes 3. Pain Management/spasticity:   -increased baclofen to 10mg  TID and 20mg  qhs  -continue tramadol for more severe pain  -continue tylenol prn 4. Mood: Zoloft 25 mg daily.  Provide emotional support 5. Neuropsych: This patient is capable of making decisions on her own behalf. 6. Skin/Wound Care: Routine skin checks, surgical sites CDI 7. Fluids/Electrolytes/Nutrition: check labs tomorrow 8.  Left femoral shaft fracture.  Status post intramedullary retrograde nailing 09/11/2017.  Nonweightbearing. 9.  Acute blood loss anemia.  Follow-up hgb 7.8 on 7/16---repeat CBC today 10.  Hyperglycemia secondary to steroid.  improved  -dc'ed cbg checks and SSI 11.  Neurogenic bowel/ bladder.    - PM bowel program with some improvement  -increased ditropan to TID  -100k Ecoli: macrobid initiated 7/11--->7 day course--abx  completed  -RN working on self-cath in w/c---increased baclofen as above to help make process easier  -may be home alone from 7am to 1pm a few days a week    -situational rationing of po fluids in morning vs foley once home 12.  Hypothyroidism.  Synthroid 13. Thrombocytopenia - PLT up to 104 K on 7/20, actually has pancytopenia, had normal WBC ~1 mo ago 14.  Sinus symptoms, flonase, added ocean nasal spray , add claritin 15.  THrush resolved d/c nystatin LOS (Days) 19 A FACE TO FACE EVALUATION WAS PERFORMED  [name] 10/10/2017, 7:29 AM

## 2017-10-10 NOTE — Progress Notes (Signed)
Occupational Therapy Session Note  Patient Details  Name: Sarah [name] MRN: [id number] Date of Birth: Feb 12, 1966  Today's Date: 10/10/2017 OT Group Time: 1130-1200 OT Group Time Calculation (min): 30 min   Skilled Therapeutic Interventions/Progress Updates:    Pt participated in therapeutic w/c level dance group with focus on UE/LE strengthening, activity tolerance, and social participation for carryover during self care tasks. Pt was guided through various dance-based exercises involving UB/LB and trunk. Emphasis placed on LE mgt with use of leg loops to increase independence with LB dressing tasks. Pt actively engaged and interacted with other group members. At end of tx pt self propelled back to room. 30 minutes missed at start of session due to RN care.   Therapy Documentation Precautions:  Precautions Precautions: Fall Precaution Comments: throracic region, LEs spasicity  Restrictions Weight Bearing Restrictions: Yes LLE Weight Bearing: Touchdown weight bearing Other Position/Activity Restrictions: LLE NWB Pain: Pt reported pain was manageable with rest breaks Pain Assessment Pain Scale: 0-10 Pain Score: 7  Pain Type: Acute pain Pain Location: Back Pain Orientation: Lower Pain Descriptors / Indicators: Aching Pain Onset: On-going Pain Intervention(s): Medication (See eMAR) ADL: ADL ADL Comments: see functional navigator     See Function Navigator for Current Functional Status.   Therapy/Group: Group Therapy  Gae Bihl A Saqib Cazarez 10/10/2017, 12:38 PM

## 2017-10-11 ENCOUNTER — Inpatient Hospital Stay (HOSPITAL_COMMUNITY): Payer: Medicare Other

## 2017-10-11 LAB — GLUCOSE, CAPILLARY: Glucose-Capillary: 94 mg/dL (ref 70–99)

## 2017-10-11 MED ORDER — BACLOFEN 20 MG PO TABS
20.0000 mg | ORAL_TABLET | Freq: Three times a day (TID) | ORAL | Status: DC
  Administered 2017-10-11 (×2): 20 mg via ORAL
  Filled 2017-10-11 (×2): qty 1

## 2017-10-11 NOTE — Progress Notes (Signed)
Occupational Therapy Session Note  Patient Details  Name: Sarah [name] MRN: [id number] Date of Birth: 22-Mar-1966  Today's Date: 10/11/2017 OT Individual Time: 0700-0800 OT Individual Time Calculation (min): 60 min    Short Term Goals: Week 3:  OT Short Term Goal 1 (Week 3): STGs = LTGs  Skilled Therapeutic Interventions/Progress Updates:    1:1. Pain was reported at 2/10. Tylenol deliered. Pt completes LB bathing with LHSS to wash BLE with set up. Pt uses reacher to thread BLE into pants. Pt rolls with min A to bend LE as pt advances pants past hips. Pt comes to EOB from supine with CGA and VC for proble solving trunk elevation. Pt slide board transfer with increased time and min A and VC for head hips relationship. Pt completes hair and UB washing at sink with set up with improved BUE strength demoed by being able to hold up hair dryer without resting elbows on sink. Exited session with pt set up with breakfast and all needs met.   Therapy Documentation Precautions:  Precautions Precautions: Fall Precaution Comments: throracic region, LEs spasicity  Restrictions Weight Bearing Restrictions: Yes LLE Weight Bearing: Touchdown weight bearing Other Position/Activity Restrictions: LLE NWB Gene See Function Navigator for Current Functional Status.   Therapy/Group: Individual Therapy  Tonny Branch 10/11/2017, 7:13 AM

## 2017-10-11 NOTE — Progress Notes (Signed)
Physical Therapy Session Note  Patient Details  Name: Sarah [name] MRN: [id number] Date of Birth: Jun 01, 1965  Today's Date: 10/11/2017 PT Individual Time: 1005-1100 PT Individual Time Calculation (min): 55 min   Short Term Goals: Week 3:  PT Short Term Goal 1 (Week 3): LTG=STG due to ELOS  Skilled Therapeutic Interventions/Progress Updates:   w/c mobility for functional strengthening and endurance to/from therapy. Used maxi move for transfer from w/c <> tilt table with min assist for anterior and lateral leans for donning and doffing of sling. Incremental increases on tilt table (with block under R foot to allow for maintaining of NWB status on LLE) to address weightbearing, decreasing spasms and tightness in LE, and upright tolerance while pt performing UE strengthening exercises with 5# straightweight for chest press, bicep curls, overhead press, and shoulder flexion x 15 reps each. Pt max 40 degrees on tilt table. Pt able to adjust positioning in w/c with min assist for scooting and able to verbalize need for adjustment. PT added lumbar roll for back support to decrease sacral sitting. All needs in reach at end of session. D/c planning discussed throughout session with pt expressing still having concerns with toilet transfers and car transfer (per chart, planning for fam ed for car transfer tues).   Therapy Documentation Precautions:  Precautions Precautions: Fall Precaution Comments: throracic region, LEs spasicity  Restrictions Weight Bearing Restrictions: Yes LLE Weight Bearing: Touchdown weight bearing Other Position/Activity Restrictions: LLE NWB  Pain: Pain Assessment Pain Scale: 0-10 Pain Score: 1  Pain Location: Back Pain Orientation: Mid;Lower Pain Descriptors / Indicators: Aching Patients Stated Pain Goal: 0 Pain Intervention(s): Medication (See eMAR);Repositioned   See Function Navigator for Current Functional Status.   Therapy/Group: Individual Therapy  Sarah [name], PT, DPT  10/11/2017, 11:19 AM

## 2017-10-11 NOTE — Progress Notes (Signed)
Pt is lethargic and sleepy but arouse easily. Pt states has been this way all day. She is wondering of change in medication could be responsible. Pt was falling asleep while being assessed.

## 2017-10-11 NOTE — Progress Notes (Signed)
Subjective/Complaints:  Discussed LE spasms both flexor and ext also hip adductor  ROS: Patient denies fever, rash, sore throat, blurred vision, nausea, vomiting, diarrhea, cough, shortness of breath or chest pain, joint or back pain, headache, or mood change.    . .    Objective: Vital Signs: Blood pressure (!) 126/92, pulse 83, temperature 98.3 F (36.8 C), temperature source Oral, resp. rate 20, height 5\' 11"  (1.803 m), weight 103.3 kg (227 lb 13.2 oz), last menstrual period 10/22/2016, SpO2 100 %. No results found. Results for orders placed or performed during the hospital encounter of 09/21/17 (from the past 72 hour(s))  CBC     Status: Abnormal   Collection Time: 10/09/17 12:19 PM  Result Value Ref Range   WBC 2.7 (L) 4.0 - 10.5 K/uL   RBC 2.62 (L) 3.87 - 5.11 MIL/uL   Hemoglobin 8.4 (L) 12.0 - 15.0 g/dL   HCT 26.6 (L) 36.0 - 46.0 %   MCV 101.5 (H) 78.0 - 100.0 fL   MCH 32.1 26.0 - 34.0 pg   MCHC 31.6 30.0 - 36.0 g/dL   RDW 18.8 (H) 11.5 - 15.5 %   Platelets 104 (L) 150 - 400 K/uL    Comment: REPEATED TO VERIFY CONSISTENT WITH PREVIOUS RESULT Performed at [facility], [address], [place], [place] [address]    *Note: Due to a large number of results and/or encounters for the requested time period, some results have not been displayed. A complete set of results can be found in Results Review.     Constitutional: No distress . Vital signs reviewed. obese HEENT: EOMI, oral membranes moist Neck: supple Cardiovascular: RRR without murmur. No JVD    Respiratory: CTA Bilaterally without wheezes or rales. Normal effort    GI: BS +, non-tender, non-distended  Neuro: Alert/Oriented, Abnormal Sensory absent motor and sensory below T2 and Abnormal Motor 5/5 BUE, 0/5 BLE, except trace knee ext Bilaterally Musc/Skel:  Other no pain with UE AROM, no pain with LE PROM Psych: pleasant Skin: surgical wound healing and intact   Assessment/Plan: 1. Functional deficits  secondary to T3 paraplegia which require 3+ hours per day of interdisciplinary therapy in a comprehensive inpatient rehab setting. Physiatrist is providing close team supervision and 24 hour management of active medical problems listed below. Physiatrist and rehab team continue to assess barriers to discharge/monitor patient progress toward functional and medical goals. FIM: Function - Bathing Position: (LB from bed, UB w/c) Body parts bathed by patient: Right arm, Left arm, Chest, Abdomen, Right upper leg, Left upper leg, Right lower leg, Left lower leg, Front perineal area Body parts bathed by helper: Buttocks Bathing not applicable: Front perineal area, Buttocks Assist Level: Touching or steadying assistance(Pt > 75%), Assistive device Assistive Device Comment: LH sponge and reacher  Function- Upper Body Dressing/Undressing What is the patient wearing?: Pull over shirt/dress, Bra Bra - Perfomed by patient: Thread/unthread right bra strap, Thread/unthread left bra strap, Hook/unhook bra (pull down sports bra) Bra - Perfomed by helper: Hook/unhook bra (pull down sports bra) Pull over shirt/dress - Perfomed by patient: Thread/unthread right sleeve, Thread/unthread left sleeve, Put head through opening, Pull shirt over trunk Pull over shirt/dress - Perfomed by helper: Pull shirt over trunk Assist Level: Set up Set up : To obtain clothing/put away Function - Lower Body Dressing/Undressing What is the patient wearing?: Pants, Ted Hose, Shoes Position: Bed Pants- Performed by patient: Thread/unthread right pants leg, Thread/unthread left pants leg Pants- Performed by helper: Pull  pants up/down Shoes - Performed by helper: Don/doff right shoe, Don/doff left shoe, Fasten right, Fasten left TED Hose - Performed by helper: Don/doff right TED hose, Don/doff left TED hose Assist for footwear: Supervision/touching assist Assist for lower body dressing: Touching or steadying assistance (Pt >  75%)  Function - Toileting Toileting activity did not occur: No continent bowel/bladder event Toileting steps completed by patient: Adjust clothing prior to toileting Toileting steps completed by helper: Performs perineal hygiene, Adjust clothing after toileting Assist level: Two helpers  Function - Air cabin crew transfer activity did not occur: Safety/medical concerns Assist level to toilet: 2 helpers(per Wyvonnia Lora, NT) Assist level from toilet: 2 helpers  Function - Chair/bed transfer Chair/bed transfer method: Lateral scoot Chair/bed transfer assist level: Moderate assist (Pt 50 - 74%/lift or lower) Chair/bed transfer assistive device: Sliding board Chair/bed transfer details: Manual facilitation for placement, Manual facilitation for weight shifting, Verbal cues for technique, Tactile cues for sequencing, Tactile cues for weight shifting  Function - Locomotion: Wheelchair Will patient use wheelchair at discharge?: Yes Type: Manual Max wheelchair distance: 300 Assist Level: No help, No cues, assistive device, takes more than reasonable amount of time Wheel 50 feet with 2 turns activity did not occur: Safety/medical concerns Assist Level: No help, No cues, assistive device, takes more than reasonable amount of time Wheel 150 feet activity did not occur: Safety/medical concerns Assist Level: No help, No cues, assistive device, takes more than reasonable amount of time Turns around,maneuvers to table,bed, and toilet,negotiates 3% grade,maneuvers on rugs and over doorsills: Yes Function - Locomotion: Ambulation Ambulation activity did not occur: Safety/medical concerns Walk 10 feet activity did not occur: Safety/medical concerns Walk 50 feet with 2 turns activity did not occur: Safety/medical concerns Walk 150 feet activity did not occur: Safety/medical concerns Walk 10 feet on uneven surfaces activity did not occur: Safety/medical concerns  Function -  Comprehension Comprehension: Auditory Comprehension assist level: Understands complex 90% of the time/cues 10% of the time  Function - Expression Expression: Verbal Expression assist level: Expresses basic needs/ideas: With extra time/assistive device  Function - Social Interaction Social Interaction assist level: Interacts appropriately with others with medication or extra time (anti-anxiety, antidepressant).  Function - Problem Solving Problem solving assist level: Solves complex 90% of the time/cues < 10% of the time  Function - Memory Memory assist level: Complete Independence: No helper Patient normally able to recall (first 3 days only): Current season, That he or she is in a hospital, Location of own room, Staff names and faces   Medical Problem List and Plan: 1.  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 Sacral sparing Cont CIR, PT,  OT.    -continues to progress toward goals    2.  DVT Prophylaxis/Anticoagulation/history of pulmonary emboli: Xarelto.  Monitor for any bleeding episodes 3. Pain Management/spasticity:   -increased baclofen to 10mg  TID and 20mg  qhs- go to 20mg  QID  -continue tramadol for more severe pain  -continue tylenol prn 4. Mood: Zoloft 25 mg daily.  Provide emotional support 5. Neuropsych: This patient is capable of making decisions on her own behalf. 6. Skin/Wound Care: Routine skin checks, surgical sites CDI 7. Fluids/Electrolytes/Nutrition: check labs tomorrow 8.  Left femoral shaft fracture.  Status post intramedullary retrograde nailing 09/11/2017.  Nonweightbearing. 9.  Acute blood loss anemia.  Follow-up hgb 7.8 on 7/16---repeat CBC today 10.  Hyperglycemia secondary to steroid.  improved  -dc'ed cbg checks and SSI 11.  Neurogenic bowel/ bladder.    - PM bowel program with some  improvement  -increased ditropan to TID  -100k Ecoli: macrobid initiated 7/11--->7 day course--abx completed  -RN working on self-cath in w/c---increased baclofen as above to help make process easier  -may be home alone from 7am to 1pm a few days a week    -situational rationing of po fluids in morning vs foley once home 12.  Hypothyroidism.  Synthroid 13. Thrombocytopenia - PLT up to 104 K on 7/20, actually has pancytopenia, had normal WBC ~1 mo ago, recheck prior to D/C 14.  Sinus symptoms, flonase, added ocean nasal spray , add claritin  LOS (Days) 20 A FACE TO FACE EVALUATION WAS PERFORMED  [name] 10/11/2017, 8:04 AM

## 2017-10-12 ENCOUNTER — Inpatient Hospital Stay (HOSPITAL_COMMUNITY): Payer: Medicare Other | Admitting: Occupational Therapy

## 2017-10-12 ENCOUNTER — Inpatient Hospital Stay (HOSPITAL_COMMUNITY): Payer: Medicare Other | Admitting: Physical Therapy

## 2017-10-12 LAB — BASIC METABOLIC PANEL
Anion gap: 7 (ref 5–15)
BUN: 12 mg/dL (ref 6–20)
CO2: 27 mmol/L (ref 22–32)
Calcium: 7.7 mg/dL — ABNORMAL LOW (ref 8.9–10.3)
Chloride: 106 mmol/L (ref 98–111)
Creatinine, Ser: 0.89 mg/dL (ref 0.44–1.00)
GFR calc Af Amer: >60 mL/min (ref >60)
GLUCOSE: 89 mg/dL (ref 70–99)
POTASSIUM: 4 mmol/L (ref 3.5–5.1)
Sodium: 140 mmol/L (ref 135–145)

## 2017-10-12 LAB — CBC
HCT: 21.8 % — ABNORMAL LOW (ref 36.0–46.0)
HEMOGLOBIN: 7 g/dL — AB (ref 12.0–15.0)
MCH: 32.4 pg (ref 26.0–34.0)
MCHC: 32.1 g/dL (ref 30.0–36.0)
MCV: 100.9 fL — ABNORMAL HIGH (ref 78.0–100.0)
Platelets: 155 10*3/uL (ref 150–400)
RBC: 2.16 MIL/uL — ABNORMAL LOW (ref 3.87–5.11)
RDW: 19.6 % — ABNORMAL HIGH (ref 11.5–15.5)
WBC: 2.8 10*3/uL — ABNORMAL LOW (ref 4.0–10.5)

## 2017-10-12 MED ORDER — SODIUM CHLORIDE 0.9 % IV SOLN
INTRAVENOUS | Status: DC
  Administered 2017-10-12 – 2017-10-15 (×6): via INTRAVENOUS

## 2017-10-12 MED ORDER — ONDANSETRON HCL 4 MG/2ML IJ SOLN
4.0000 mg | Freq: Four times a day (QID) | INTRAMUSCULAR | Status: DC | PRN
  Administered 2017-10-12: 4 mg via INTRAMUSCULAR
  Filled 2017-10-12: qty 2

## 2017-10-12 MED ORDER — BACLOFEN 10 MG PO TABS
10.0000 mg | ORAL_TABLET | Freq: Three times a day (TID) | ORAL | Status: DC
  Filled 2017-10-12: qty 1

## 2017-10-12 NOTE — Progress Notes (Signed)
Subjective/Complaints:  Nauseated this am , was lethargic yesterday, increased baclofen yesterday due to c/o LE spasticity  ROS: Patient denies  nausea, vomiting, diarrhea, cough, shortness of breath or chest pain, joint or back pain,  . .    Objective: Vital Signs: Blood pressure (!) 164/86, pulse 79, temperature 97.7 F (36.5 C), temperature source Oral, resp. rate 14, height 5\' 11"  (1.803 m), weight 103.3 kg (227 lb 13.2 oz), last menstrual period 10/22/2016, SpO2 100 %. No results found. Results for orders placed or performed during the hospital encounter of 09/21/17 (from the past 72 hour(s))  CBC     Status: Abnormal   Collection Time: 10/09/17 12:19 PM  Result Value Ref Range   WBC 2.7 (L) 4.0 - 10.5 K/uL   RBC 2.62 (L) 3.87 - 5.11 MIL/uL   Hemoglobin 8.4 (L) 12.0 - 15.0 g/dL   HCT 26.6 (L) 36.0 - 46.0 %   MCV 101.5 (H) 78.0 - 100.0 fL   MCH 32.1 26.0 - 34.0 pg   MCHC 31.6 30.0 - 36.0 g/dL   RDW 18.8 (H) 11.5 - 15.5 %   Platelets 104 (L) 150 - 400 K/uL    Comment: REPEATED TO VERIFY CONSISTENT WITH PREVIOUS RESULT Performed at [facility], [address] [place], [place] [address]   Glucose, capillary     Status: None   Collection Time: 10/11/17  3:43 PM  Result Value Ref Range   Glucose-Capillary 94 70 - 99 mg/dL   *Note: Due to a large number of results and/or encounters for the requested time period, some results have not been displayed. A complete set of results can be found in Results Review.     Constitutional: No distress . Vital signs reviewed. obese HEENT: EOMI, oral membranes moist Neck: supple Cardiovascular: RRR without murmur. No JVD    Respiratory: CTA Bilaterally without wheezes or rales. Normal effort    GI: BS +, non-tender, non-distended  Neuro: Alert/Oriented, Abnormal Sensory absent motor and sensory below T2 and Abnormal Motor 5/5 BUE, 0/5 BLE, except trace knee ext Bilaterally Musc/Skel:  Other no pain with UE AROM, no pain with LE  PROM Psych: pleasant Skin: surgical wound healing and intact   Assessment/Plan: 1. Functional deficits secondary to T3 paraplegia which require 3+ hours per day of interdisciplinary therapy in a comprehensive inpatient rehab setting. Physiatrist is providing close team supervision and 24 hour management of active medical problems listed below. Physiatrist and rehab team continue to assess barriers to discharge/monitor patient progress toward functional and medical goals. FIM: Function - Bathing Position: (LB from bed, UB w/c) Body parts bathed by patient: Right arm, Left arm, Chest, Abdomen, Right upper leg, Left upper leg, Right lower leg, Left lower leg, Front perineal area Body parts bathed by helper: Buttocks Bathing not applicable: Front perineal area, Buttocks Assist Level: Touching or steadying assistance(Pt > 75%), Assistive device Assistive Device Comment: LH sponge and reacher  Function- Upper Body Dressing/Undressing What is the patient wearing?: Pull over shirt/dress, Bra Bra - Perfomed by patient: Thread/unthread right bra strap, Thread/unthread left bra strap, Hook/unhook bra (pull down sports bra) Bra - Perfomed by helper: Hook/unhook bra (pull down sports bra) Pull over shirt/dress - Perfomed by patient: Thread/unthread right sleeve, Thread/unthread left sleeve, Put head through opening, Pull shirt over trunk Pull over shirt/dress - Perfomed by helper: Pull shirt over trunk Assist Level: Set up Set up : To obtain clothing/put away Function - Lower Body Dressing/Undressing What is the patient  wearing?: Pants, [employer], Shoes Position: Bed Pants- Performed by patient: Thread/unthread right pants leg, Thread/unthread left pants leg Pants- Performed by helper: Pull pants up/down Shoes - Performed by helper: Don/doff right shoe, Don/doff left shoe, Fasten right, Fasten left TED Hose - Performed by helper: Don/doff right TED hose, Don/doff left TED hose Assist for footwear:  Supervision/touching assist Assist for lower body dressing: Touching or steadying assistance (Pt > 75%)  Function - Toileting Toileting activity did not occur: No continent bowel/bladder event Toileting steps completed by patient: Adjust clothing prior to toileting Toileting steps completed by helper: Performs perineal hygiene, Adjust clothing after toileting Assist level: Two helpers  Function - Air cabin crew transfer activity did not occur: Safety/medical concerns Assist level to toilet: 2 helpers(per Wyvonnia Lora, NT) Assist level from toilet: 2 helpers  Function - Chair/bed transfer Chair/bed transfer method: Lateral scoot Chair/bed transfer assist level: Moderate assist (Pt 50 - 74%/lift or lower) Chair/bed transfer assistive device: Sliding board Chair/bed transfer details: Manual facilitation for placement, Manual facilitation for weight shifting, Verbal cues for technique, Tactile cues for sequencing, Tactile cues for weight shifting  Function - Locomotion: Wheelchair Will patient use wheelchair at discharge?: Yes Type: Manual Max wheelchair distance: 150' Assist Level: No help, No cues, assistive device, takes more than reasonable amount of time Wheel 50 feet with 2 turns activity did not occur: Safety/medical concerns Assist Level: No help, No cues, assistive device, takes more than reasonable amount of time Wheel 150 feet activity did not occur: Safety/medical concerns Assist Level: No help, No cues, assistive device, takes more than reasonable amount of time Turns around,maneuvers to table,bed, and toilet,negotiates 3% grade,maneuvers on rugs and over doorsills: Yes Function - Locomotion: Ambulation Ambulation activity did not occur: Safety/medical concerns Walk 10 feet activity did not occur: Safety/medical concerns Walk 50 feet with 2 turns activity did not occur: Safety/medical concerns Walk 150 feet activity did not occur: Safety/medical concerns Walk 10  feet on uneven surfaces activity did not occur: Safety/medical concerns  Function - Comprehension Comprehension: Auditory Comprehension assist level: Understands complex 90% of the time/cues 10% of the time  Function - Expression Expression: Verbal Expression assist level: Expresses basic needs/ideas: With extra time/assistive device  Function - Social Interaction Social Interaction assist level: Interacts appropriately 90% of the time - Needs monitoring or encouragement for participation or interaction.  Function - Problem Solving Problem solving assist level: Solves complex 90% of the time/cues < 10% of the time  Function - Memory Memory assist level: Complete Independence: No helper Patient normally able to recall (first 3 days only): Current season, Location of own room, Staff names and faces, That he or she is in a hospital   Medical Problem List and Plan: 1.  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 Cont CIR, PT,  OT.    -continues to progress toward goals    2.  DVT Prophylaxis/Anticoagulation/history of pulmonary emboli: Xarelto.  Monitor for any bleeding episodes 3. Pain Management/spasticity:   -pt with lethargy and nausea reduce baclofen to 10mg  TID and 20mg  qhs-   -continue tramadol for more severe pain  -continue tylenol prn 4. Mood: Zoloft 25 mg daily.  Provide emotional support 5. Neuropsych: This patient is capable of making decisions on her own behalf. 6. Skin/Wound Care: Routine skin checks, Left knee incison well healed 7. Fluids/Electrolytes/Nutrition: check labs tomorrow 8.  Left femoral shaft fracture.  Status post  intramedullary retrograde nailing 09/11/2017.  Nonweightbearing.? X 6wks 9.  Acute blood loss anemia.  Follow-up hgb 7.8 on 7/16---repeat CBC today 10.  Hyperglycemia secondary to steroid.  improved  -dc'ed cbg checks and SSI 11.   Neurogenic bowel/ bladder.    - PM bowel program with some improvement  -increased ditropan to TID  -100k Ecoli: macrobid initiated 7/11--->7 day course--abx completed  -RN working on self-cath in w/c---increased baclofen as above to help make process easier  -may be home alone from 7am to 1pm a few days a week    12.  Hypothyroidism.  Synthroid 13. Thrombocytopenia - PLT up to 104 K on 7/20, actually has pancytopenia, had normal WBC ~1 mo ago, recheck in am 14.  Sinus symptoms, flonase, added ocean nasal spray , add claritin 15.  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

## 2017-10-12 NOTE — Progress Notes (Signed)
Patient complains a continuous gnawing / twisting epigastric pain that started Saturday evening.  Reports nothing has helped her abdominal pain.  She does have some associated nausea and vomiting affecting her ability to eat.  Patient concerned that her gallstones may be causing her symptoms.  No fever.  Bowel program with suppository and digital stimulation every evening effective.  Some mucous noted in stool this morning by night shift.  No stools throughout the day today.  Did not administer pain medication today as patient has been overly sedated/lethargic and has been sleeping most of the day.  Will continue to monitor.  Brita Romp, RN

## 2017-10-12 NOTE — Progress Notes (Signed)
Patient has been increasingly drowsy throughout the day.  She has not eaten or consumed fluids since last night.  Symptoms thought to be to associated with recent increase in baclofen.  Cathed at 1230 after 8 hours of no void, only 150 ml of cloudy, malodorous urine returned.  Patient also complains of nausea and has vomited x1 this shift.  Notified Algis Liming, PA of assessment, new orders written for labs and IVF.  Held most medications for the day due to lethargy.  Will continue to monitor.  Brita Romp, RN

## 2017-10-12 NOTE — Progress Notes (Signed)
Occupational Therapy Session Note  Patient Details  Name: Sarah [name] MRN: [id number] Date of Birth: Apr 06, 1965  Today's Date: 10/12/2017 OT Individual Time: 7366-8159 OT Individual Time Calculation (min): 30 min  45 minutes missed   Short Term Goals: Week 3:  OT Short Term Goal 1 (Week 3): STGs = LTGs  Skilled Therapeutic Interventions/Progress Updates:    Pt greeted supine in bed. Reporting feeling very nauseated and unwell. RN aware. With pt consent provided her with citrus/ginger essential oil blend to use via inhalation to decrease nausea.  Discussed adjustment to LTGs with pt verbalizing understanding. She is still apprehensive regarding toilet transfers so we brainstormed coping strategies to help with easing feelings of anxiety when transferring (such as deep breathing and visualization). Provided pt with MHP to chest to promote neutral warmth for her to rest/sleep. At end of tx pt was left with all needs within reach. 45 minutes missed due to nausea.   Therapy Documentation Precautions:  Precautions Precautions: Fall Precaution Comments: throracic region, LEs spasicity  Restrictions Weight Bearing Restrictions: Yes LLE Weight Bearing: Touchdown weight bearing Other Position/Activity Restrictions: LLE NWB Pain: No c/o pain during tx    ADL: ADL ADL Comments: see functional navigator     See Function Navigator for Current Functional Status.   Therapy/Group: Individual Therapy  Yvana Samonte A Navreet Bolda 10/12/2017, 8:37 AM

## 2017-10-12 NOTE — Plan of Care (Signed)
  Problem: RH SAFETY Goal: RH STG ADHERE TO SAFETY PRECAUTIONS W/ASSISTANCE/DEVICE Description STG Adhere to Safety Precautions With min Assistance/Device.  Outcome: Progressing   Problem: RH PAIN MANAGEMENT Goal: RH STG PAIN MANAGED AT OR BELOW PT'S PAIN GOAL Description Pain < or = 4 with min assistance  Outcome: Progressing   Problem: Consults Goal: RH SPINAL CORD INJURY PATIENT EDUCATION Description  See Patient Education module for education specifics.  Outcome: Not Progressing Goal: Skin Care Protocol Initiated - if Braden Score 18 or less Description If consults are not indicated, leave blank or document N/A Outcome: Not Progressing Goal: Nutrition Consult-if indicated Outcome: Not Progressing   Problem: SCI BOWEL ELIMINATION Goal: RH STG MANAGE BOWEL WITH ASSISTANCE Description STG Manage Bowel with mod Assistance.  Outcome: Not Progressing Goal: RH STG SCI MANAGE BOWEL WITH MEDICATION WITH ASSISTANCE Description STG SCI Manage bowel with medication with mod assistance.  Outcome: Not Progressing Goal: RH STG SCI MANAGE BOWEL PROGRAM W/ASSIST OR AS APPROPRIATE Description STG SCI Manage bowel program w/assist mod   Outcome: Not Progressing   Problem: SCI BLADDER ELIMINATION Goal: RH STG MANAGE BLADDER WITH ASSISTANCE Description STG Manage Bladder With mod Assistance  Outcome: Not Progressing   Problem: RH SKIN INTEGRITY Goal: RH STG SKIN FREE OF INFECTION/BREAKDOWN Description Skin wound healing and no new skin breakdown with min assistance  Outcome: Not Progressing Goal: RH STG MAINTAIN SKIN INTEGRITY WITH ASSISTANCE Description STG Maintain Skin Integrity With min Assistance.  Outcome: Not Progressing Goal: RH STG ABLE TO PERFORM INCISION/WOUND CARE W/ASSISTANCE Description STG Able To Perform Incision/Wound Care With mod Assistance.  Outcome: Not Progressing   Problem: RH KNOWLEDGE DEFICIT SCI Goal: RH STG INCREASE KNOWLEDGE OF SELF CARE AFTER  SCI Description Patient able to verbalize safe care activities with min assistance   Outcome: Not Progressing  Patient limited today due to acute lethargy r/t recent increase in medications.

## 2017-10-12 NOTE — Progress Notes (Signed)
Occupational Therapy Session Note  Patient Details  Name: Sarah [name] MRN: [id number] Date of Birth: 02-Feb-1966  Today's Date: 10/12/2017 OT Individual Time: 8250-5397 OT Individual Time Calculation (min): 38 min    Short Term Goals: Week 3:  OT Short Term Goal 1 (Week 3): STGs = LTGs  Skilled Therapeutic Interventions/Progress Updates:    Upon entering the room, pt supine in bed and NT doing bladder scan. Pt needing I&O cath. Pt is nauseated and required second person to assist with positioning for placement. OT discussing with pt plans for family education tomorrow and pt's progress towards goal. Pt verbalized agreement and understanding. OT positioned pt into side laying position on the R for pressure relief. Pt continues to feel nauseated throughout session. Call bell and all needed items within reach. Bed alarm activated.   Therapy Documentation Precautions:  Precautions Precautions: Fall Precaution Comments: throracic region, LEs spasicity  Restrictions Weight Bearing Restrictions: Yes LLE Weight Bearing: Touchdown weight bearing Other Position/Activity Restrictions: LLE NWB General: General PT Missed Treatment Reason: Patient ill (Comment) Vital Signs: Therapy Vitals Temp: 97.6 F (36.4 C) Pulse Rate: 72 Resp: 17 BP: 140/81 Patient Position (if appropriate): Lying Oxygen Therapy SpO2: 100 % O2 Device: Room Air Pain:   ADL: ADL ADL Comments: see functional navigator  See Function Navigator for Current Functional Status.   Therapy/Group: Individual Therapy  [name] 10/12/2017, 1:42 PM

## 2017-10-12 NOTE — Plan of Care (Signed)
LTGs adjusted 10/12/17

## 2017-10-12 NOTE — Plan of Care (Signed)
  Problem: RH Bed Mobility Goal: LTG Patient will perform bed mobility with assist (PT) Description LTG: Patient will perform bed mobility with assistance, with/without cues (PT). Flowsheets (Taken 10/12/2017 1142) LTG: Pt will perform bed mobility with assistance level of: Minimal Assistance - Patient > 75% (downgraded 7/22 cw) Note:  Downgraded due to slow progres   Problem: RH Bed to Chair Transfers Goal: LTG Patient will perform bed/chair transfers w/assist (PT) Description LTG: Patient will perform bed to chair transfers with assistance (PT). Flowsheets (Taken 10/12/2017 1142) LTG: Pt will perform Bed to Chair Transfers with assistance level  : Moderate Assistance - Patient 50 - 74% (downgraded 7/22 cw) Note:  Downgraded due to slow progress   Problem: RH Car Transfers Goal: LTG Patient will perform car transfers with assist (PT) Description LTG: Patient will perform car transfers with assistance (PT). Flowsheets (Taken 10/12/2017 1142) LTG: Pt will perform car transfers with assist:: Maximal Assistance - Patient 25 - 49% (downgraded 7/22 cw) Note:  Downgraded due to slow progress

## 2017-10-12 NOTE — Progress Notes (Signed)
Physical Therapy Session Note  Patient Details  Name: Sarah [name] MRN: [id number] Date of Birth: Feb 21, 1966  Today's Date: 10/12/2017  Short Term Goals: Week 3:  PT Short Term Goal 1 (Week 3): LTG=STG due to ELOS  Skilled Therapeutic Interventions/Progress Updates: Pt received in bed, denies pain but reports nausea, vomiting this AM and declines participation in therapy. Offered alternative options for participation at bed level however pt continues to politely decline. Continue per POC as pt able to participate.     Therapy Documentation Precautions:  Precautions Precautions: Fall Precaution Comments: throracic region, LEs spasicity  Restrictions Weight Bearing Restrictions: Yes LLE Weight Bearing: Touchdown weight bearing Other Position/Activity Restrictions: LLE NWB General: PT Amount of Missed Time (min): 30 Minutes PT Missed Treatment Reason: Patient ill (Comment) Pain: Pain Assessment Pain Scale: Faces Faces Pain Scale: No hurt  See Function Navigator for Current Functional Status.   Therapy/Group: Individual Therapy  Corliss Skains 10/12/2017, 10:09 AM

## 2017-10-12 NOTE — Progress Notes (Signed)
Lethargic all night, wakes easily, but immediately falls back to sleep. This AM at 0500, after AM cath, patient had dry heaves, no vomiting. Unable to tolerate PO zofran, paged Dr. Letta Pate, orders received for zofran IM. AM baclofen held this AM. Moved synthroid to day shift. Urine malodorous. I & O cath at 2400=375 and 0440=325cc's. Small incontinent BM with mucous. Sarah [name] A

## 2017-10-12 NOTE — Progress Notes (Addendum)
Physical Therapy Session Note  Patient Details  Name: AIZLYN SCHIFANO MRN: [id number] Date of Birth: 01/09/1966  Today's Date: 10/12/2017   Short Term Goals: Week 3:  PT Short Term Goal 1 (Week 3): LTG=STG due to ELOS  Skilled Therapeutic Interventions/Progress Updates:  Pt received lying in bed with RN present connecting her to IV fluids. Pt declining participation in therapy on this date, even bed level exercises, stating "I don't think I can do up" right now. RN reports pt just vomited and suggests deferring therapy session. Pt missed 60 minutes of skilled PT treatment 2/2 N/V, will f/u per POC.  Therapy Documentation Precautions:  Precautions Precautions: Fall Precaution Comments: throracic region, LEs spasicity  Restrictions Weight Bearing Restrictions: Yes LLE Weight Bearing: Touchdown weight bearing Other Position/Activity Restrictions: LLE NWB General: PT Amount of Missed Time (min): 60 Minutes PT Missed Treatment Reason: Patient ill (Comment)(N/V)   See Function Navigator for Current Functional Status.   Therapy/Group: Individual Therapy  Waunita Schooner 10/12/2017, 2:35 PM

## 2017-10-13 ENCOUNTER — Inpatient Hospital Stay (HOSPITAL_COMMUNITY): Payer: Medicare Other | Admitting: Occupational Therapy

## 2017-10-13 ENCOUNTER — Ambulatory Visit (HOSPITAL_COMMUNITY): Payer: Medicare Other | Admitting: Physical Therapy

## 2017-10-13 ENCOUNTER — Inpatient Hospital Stay (HOSPITAL_COMMUNITY): Payer: Medicare Other

## 2017-10-13 LAB — URINALYSIS, ROUTINE W REFLEX MICROSCOPIC
BILIRUBIN URINE: NEGATIVE
Glucose, UA: NEGATIVE mg/dL
HGB URINE DIPSTICK: NEGATIVE
KETONES UR: 20 mg/dL — AB
Nitrite: POSITIVE — AB
Protein, ur: NEGATIVE mg/dL
SPECIFIC GRAVITY, URINE: 1.013 (ref 1.005–1.030)
pH: 5 (ref 5.0–8.0)

## 2017-10-13 NOTE — Progress Notes (Signed)
wPhysical Therapy Session Note  Patient Details  Name: Sarah [name] MRN: [id number] Date of Birth: Aug 19, 1965  Today's Date: 10/13/2017 PT Individual Time: 1100-1130 PT Individual Time Calculation (min): 30 min   Short Term Goals: Week 3:  PT Short Term Goal 1 (Week 3): LTG=STG due to ELOS  Skilled Therapeutic Interventions/Progress Updates:    Pt reports no pain prior to treatment session onset and indicates she continues to feel "loopy." PT initiated stretching in supine to inc LE flexibility to progress Pt towards improved independence with functional mobility. PT performed passive stretching for B LE's 3x/30 sec of hamstring, hip IR&ER, and gluteus maximus. Pt missed 35 minutes of therapy due to continued sx of "loopiness" and preference to stay in bed.   Therapy Documentation Precautions:  Precautions Precautions: Fall Precaution Comments: throracic region, LEs spasicity  Restrictions Weight Bearing Restrictions: Yes LLE Weight Bearing: Touchdown weight bearing Other Position/Activity Restrictions: LLE NWB   See Function Navigator for Current Functional Status.   Therapy/Group: Individual Therapy  Floreen Comber 10/13/2017, 11:46 AM

## 2017-10-13 NOTE — Progress Notes (Signed)
Occupational Therapy Session Note  Patient Details  Name: Sarah [name] MRN: [id number] Date of Birth: Jan 22, 1966  Today's Date: 10/13/2017 OT Individual Time: 1030-1100 OT Individual Time Calculation (min): 30 min    Short Term Goals: Week 3:  OT Short Term Goal 1 (Week 3): STGs = LTGs  Skilled Therapeutic Interventions/Progress Updates:    Upon entering the room, pt supine in bed with no c/o pain but continues to feel "loopy". Pt is agreeable to bed level therapies for UE strengthening. Pt utilized level 2 theraband for B UE strengthening. OT demonstrating and educating pt on exercises. Pt returned demonstrations with min verbal cues for proper technique. Pt performing 3 sets of 15 bicep curls, shoulder elevation, shoulder diagonals, chest pulls, and alternating punches. Pt remained supine in bed with call bell and all needed items within reach.   Therapy Documentation Precautions:  Precautions Precautions: Fall Precaution Comments: throracic region, LEs spasicity  Restrictions Weight Bearing Restrictions: Yes LLE Weight Bearing: Touchdown weight bearing Other Position/Activity Restrictions: LLE NWB General: General PT Missed Treatment Reason: Patient ill (Comment)("loopy" and groggy from medication) Vital Signs: Therapy Vitals Temp: 98.7 F (37.1 C) Temp Source: Oral Pulse Rate: 80 Resp: 18 BP: (!) 145/83 Patient Position (if appropriate): Lying Oxygen Therapy SpO2: 100 % O2 Device: Room Air Pain:   ADL: ADL ADL Comments: see functional navigator  See Function Navigator for Current Functional Status.   Therapy/Group: Individual Therapy  [name] 10/13/2017, 3:00 PM

## 2017-10-13 NOTE — Patient Care Conference (Signed)
Inpatient RehabilitationTeam Conference and Plan of Care Update Date: 10/13/2017   Time: 10:15 AM    Patient Name: Sarah [name]      Medical Record Number: [id number]  Date of Birth: 1965-04-13 Sex: Female         Room/Bed: 4W18C/4W18C-01 Payor Info: Payor: [employer] MEDICARE / Plan: [facility] MEDICARE / Product Type: *No Product type* /    Admitting Diagnosis: T4 Para  Admit Date/Time:  09/21/2017  4:57 PM Admission Comments: No comment available   Primary Diagnosis:  <principal problem not specified> Principal Problem: <principal problem not specified>  Patient Active Problem List   Diagnosis Date Noted  . Myelopathy ([place]) 09/21/2017  . Muscle spasms of both lower extremities   . Closed fracture of shaft of left femur ([place])   . Acute blood loss anemia   . Hypothyroidism   . Nausea & vomiting 09/15/2017  . Closed fracture of left distal femur ([place]) 09/11/2017  . Paraplegia ([place]) 09/08/2017  . Obesity (BMI 30-39.9) 09/03/2017  . Status post surgery 09/03/2017  . Calculus of gallbladder without cholecystitis without obstruction   . Biliary dyskinesia   . Intractable nausea and vomiting 08/09/2017  . Thrombocytopenia ([place]) 08/09/2017  . Elevated bilirubin 08/09/2017  . AKI (acute kidney injury) ([place]) 08/09/2017  . Nasal congestion 04/05/2017  . Breast pain 01/26/2017  . Upper respiratory infection, viral 01/26/2017  . Counseling regarding advanced care planning and goals of care 01/02/2017  . Bilateral rotator cuff syndrome 02/12/2016  . E. coli UTI 08/01/2015  . Constipation due to neurogenic bowel 08/01/2015  . Muscle spasticity   . 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 Spastic paraparesis   . Neurogenic bladder   . History of pulmonary embolism   . Post-operative pain   . Metastatic cancer ([place])   . Steroid-induced hyperglycemia   . Depression   . Obstipation   . Thoracic spine tumor 07/06/2015  . 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 Spinal  cord compression due to malignant neoplasm metastatic to spine ([place])   . Postoperative hypothyroidism   . 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 Spasticity 09/19/2014  . Dysuria 09/04/2014  . Hypertension 05/01/2014  . Ingrown right big toenail 05/01/2014  . GERD (gastroesophageal reflux disease) 02/14/2014  . Dysphagia, pharyngoesophageal phase 02/06/2014  . Type 2 diabetes mellitus without complication ([place]) 66/44/0347  . Constipation   . Neurogenic bowel   . Paraplegia at T4 level ([place]) 01/20/2014  . Metastatic cancer to spine ([place]) 01/19/2014  . Rotator cuff tendonitis   . 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 Leg weakness, bilateral 12/13/2013    Expected Discharge Date: Expected Discharge Date: 10/18/17  Team Members Present: Physician leading conference: Dr. Delice Lesch Social Worker Present: Ovidio Kin, LCSW Nurse Present: Dwaine Gale, RN PT Present: Dwyane Dee, PT OT Present: Benay Pillow, OT SLP Present: Charolett Bumpers, SLP PPS Coordinator present : Daiva Nakayama, RN, CRRN     Current Status/Progress Goal Weekly Team Focus  Medical   Paraplegia, increased tone but had MS changes on Baclofen, now improving, recurrent UTI  reduce tone, establish bowel and bladder program  treat UTI, adjust spasticity meds   Bowel/Bladder   Incontinent of bladder/Bowel with I/O/cath per orders/day/ LBM 10/13/17 Continue bowel rogram and digital stimulation initiated  able to manage bowel and bladder with mod assistance  Assess bowel patterns and effect of bowel program QS, along  with time toilet assessment    Swallow/Nutrition/ Hydration             ADL's   Mod A slideboard transfer to padded tub bench, Setup UB dressing, Mod A LB dressing, Min A bathing bedlevel   goals downgraded to Min-Mod A overall   pt/family education, functional transfers, modified bathing/dressing skills,  core strengthening, endurance    Mobility   mod I w/c management, min/mod bed mobility, mod A slide board transfers, confusion/hallucinating this week?  planning family education as cognition clears, downgraded goals to min bed mobility, mod for slide board, max for car  uneven transfers, bed mobility, LE management with leg loops, UE strengthening, activity tolerance   Communication             Safety/Cognition/ Behavioral Observations            Pain    continue current pain regime with Tylenol and Tramadol prn current pain score 5/10 with relief veerbal of decrease pain to back  Pt will be pain free  Assess QS and  PRN per orders notify MD/PA if no relief verbalized   Skin   s/p surgical areas healing well to back,hip, groin area  pt will remain free of infection and remain free of breakdown  QS assessment and prn management continue, surgical areas healing well no complications or drainage      *See Care Plan and progress notes for long and short-term goals.     Barriers to Discharge  Current Status/Progress Possible Resolutions Date Resolved   Physician    Medical stability;Decreased caregiver support;Neurogenic Bowel & Bladder     stabilizing medically  may look at d/c options, now ok for WB LLE      Nursing                  PT                    OT Pending chemo/radiation;Decreased caregiver support                SLP                SW                Discharge Planning/Teaching Needs:    HOme with sister and brother in-law who can be there intermittently. Pt will be alone at times. Family training this week with sister. Sister has provided care before to pt.     Team Discussion:  Pt medically unable to participate in therapies yesterday due to medication increase. Today still groggy. Family education postponed until Friday. Foley top be placed for home due to alone at times. Repeating UA today. Baclofen on hold. Downgraded goals to min-mod level of assist.   Revisions  to Treatment Plan:  Extended discharge date to 7/28 due to missed therapies.     Continued Need for Acute Rehabilitation Level of Care: The patient requires daily medical management by a physician with specialized training in physical medicine and rehabilitation for the following conditions: Daily direction of a multidisciplinary physical rehabilitation program to ensure safe treatment while eliciting the highest outcome that is of practical value to the patient.: Yes Daily medical management of patient stability for increased activity during participation in an intensive rehabilitation regime.: Yes Daily analysis of laboratory values and/or radiology reports with any subsequent need for medication adjustment of medical intervention for : Neurological problems;Urological problems  Salif Tay, Gardiner Rhyme 10/14/2017, 12:11 PM

## 2017-10-13 NOTE — Progress Notes (Signed)
Social Work Patient ID: Sarah [name], female   DOB: 1965/12/11, 52 y.o.   MRN: [id number]  Met with pt to discuss team conference goals min-mod level of assist and discharge extension due to medical issues. She will contact sister to see if can come in Friday or Sat for education. She is concerned she is not doing a swell and going home and being alone there while sister works. She will talk with family and let team now when sister can come in and the plan. Aware team feels by Nancy Fetter 7/28 she will be ready for discharge. Lucy-LCSW to return on Thursday

## 2017-10-13 NOTE — Progress Notes (Signed)
Occupational Therapy Session Note  Patient Details  Name: Sarah [name] MRN: [id number] Date of Birth: September 06, 1965  Today's Date: 10/13/2017 OT Individual Time: 0701-0802 and 0933-1000 abd 0933-1000 OT Individual Time Calculation (min): 61 min  And 27 min and Today's Date: 10/13/2017 OT Missed Time: 10 Minutes Missed Time Reason: Patient fatigue   Short Term Goals: Week 3:  OT Short Term Goal 1 (Week 3): STGs = LTGs  Skilled Therapeutic Interventions/Progress Updates:    Session 1: Upon entering the room, pt supine in bed and continues to report not feeling well but no longer nauseated. Pt agreeable to attempt bed level self care tasks. Pt performed UB self care with set up A to obtain needed items and increased time. Pt performed LB self care with use of LH reacher and LH sponge to increase independence. Pt needing increased time and min verbal cues for sequencing this session likely due to medications. Pt unsafe for transfer at this time. Breakfast tray placed in front of pt and she was encouraged to eat. Bed alarm activated and all needs within reach upon exiting.   Session 2: Pt remains supine and continues to report feeling "loopy". Pt declined transfer attempt this session and pt calling family members to reschedule family education for later in the week when pt is able to better participate. OT discussing possible need to change discharge date secondary to pt's current state due to medication changes. Pt agreeable and OT goals discussed as well as plan made for next treatment session. Pt remained in bed with call bell and all needed items within reach upon exiting the room.   Therapy Documentation Precautions:  Precautions Precautions: Fall Precaution Comments: throracic region, LEs spasicity  Restrictions Weight Bearing Restrictions: Yes LLE Weight Bearing: Touchdown weight bearing Other Position/Activity Restrictions: LLE NWB General: General OT Amount of Missed Time: 10  Minutes Vital Signs: Therapy Vitals Temp: 98.8 F (37.1 C) Temp Source: Oral Pulse Rate: 85 Resp: 18 BP: 138/80 Patient Position (if appropriate): Lying Oxygen Therapy SpO2: 98 % O2 Device: Room Air ADL: ADL ADL Comments: see functional navigator  See Function Navigator for Current Functional Status.   Therapy/Group: Individual Therapy  Sarah [name] 10/13/2017, 8:12 AM

## 2017-10-13 NOTE — Progress Notes (Signed)
Subjective/Complaints:  No Issues overnite, still feels a little groggy  ROS: Patient denies  nausea, vomiting, diarrhea, cough, shortness of breath or chest pain, joint or back pain,  . .    Objective: Vital Signs: Blood pressure 138/80, pulse 85, temperature 98.8 F (37.1 C), temperature source Oral, resp. rate 18, height _0  (1.803 m), weight 103.3 kg (227 lb 13.2 oz), last menstrual period 10/22/2016, SpO2 98 %. No results found. Results for orders placed or performed during the hospital encounter of 09/21/17 (from the past 72 hour(s))  Glucose, capillary     Status: None   Collection Time: 10/11/17  3:43 PM  Result Value Ref Range   Glucose-Capillary 94 70 - 99 mg/dL  Basic metabolic panel     Status: Abnormal   Collection Time: 10/12/17  2:10 PM  Result Value Ref Range   Sodium 140 135 - 145 mmol/L   Potassium 4.0 3.5 - 5.1 mmol/L   Chloride 106 98 - 111 mmol/L   CO2 27 22 - 32 mmol/L   Glucose, Bld 89 70 - 99 mg/dL   BUN 12 6 - 20 mg/dL   Creatinine, Ser 0.89 0.44 - 1.00 mg/dL   Calcium 7.7 (L) 8.9 - 10.3 mg/dL   GFR calc non Af Amer >60 >60 mL/min   GFR calc Af Amer >60 >60 mL/min    Comment: (NOTE) The eGFR has been calculated using the CKD EPI equation. This calculation has not been validated in all clinical situations. eGFR's persistently <60 mL/min signify possible Chronic Kidney Disease.    Anion gap 7 5 - 15    Comment: Performed at [facility] [address] [place], [place] [address]  CBC     Status: Abnormal   Collection Time: 10/12/17  2:10 PM  Result Value Ref Range   WBC 2.8 (L) 4.0 - 10.5 K/uL   RBC 2.16 (L) 3.87 - 5.11 MIL/uL   Hemoglobin 7.0 (L) 12.0 - 15.0 g/dL   HCT 21.8 (L) 36.0 - 46.0 %   MCV 100.9 (H) 78.0 - 100.0 fL   MCH 32.4 26.0 - 34.0 pg   MCHC 32.1 30.0 - 36.0 g/dL   RDW 19.6 (H) 11.5 - 15.5 %   Platelets 155 150 - 400 K/uL    Comment: Performed at [facility] [address] [place], [place] [id number]    *Note: Due to a large number of results and/or encounters for the requested time period, some results have not been displayed. A complete set of results can be found in Results Review.     Constitutional: No distress . Vital signs reviewed. obese HEENT: EOMI, oral membranes moist Neck: supple Cardiovascular: RRR without murmur. No JVD    Respiratory: CTA Bilaterally without wheezes or rales. Normal effort    GI: BS +, non-tender, non-distended  Neuro: Alert/Oriented person place month day and year not date, Abnormal Sensory absent motor and sensory below T2 and Abnormal Motor 5/5 BUE, 0/5 BLE, except trace knee ext Bilaterally Musc/Skel:  Other no pain with UE AROM, no pain with LE PROM Psych: pleasant Skin: surgical wound healing and intact   Assessment/Plan: 1. Functional deficits secondary to T3 paraplegia which require 3+ hours per day of interdisciplinary therapy in a comprehensive inpatient rehab setting. Physiatrist is providing close team supervision and 24 hour management of active medical problems listed below. Physiatrist and rehab team continue to assess barriers to discharge/monitor patient progress toward functional and medical goals. FIM:  Function - Bathing Position: (LB from bed, UB w/c) Body parts bathed by patient: Right arm, Left arm, Chest, Abdomen, Right upper leg, Left upper leg, Right lower leg, Left lower leg, Front perineal area Body parts bathed by helper: Buttocks Bathing not applicable: Front perineal area, Buttocks Assist Level: Touching or steadying assistance(Pt > 75%), Assistive device Assistive Device Comment: LH sponge and reacher  Function- Upper Body Dressing/Undressing What is the patient wearing?: Pull over shirt/dress, Bra Bra - Perfomed by patient: Thread/unthread right bra strap, Thread/unthread left bra strap, Hook/unhook bra (pull down sports bra) Bra - Perfomed by helper: Hook/unhook bra (pull down sports bra) Pull over shirt/dress -  Perfomed by patient: Thread/unthread right sleeve, Thread/unthread left sleeve, Put head through opening, Pull shirt over trunk Pull over shirt/dress - Perfomed by helper: Pull shirt over trunk Assist Level: Set up Set up : To obtain clothing/put away Function - Lower Body Dressing/Undressing What is the patient wearing?: Pants, Maryln Manuel, Shoes Position: Bed Pants- Performed by patient: Thread/unthread right pants leg, Thread/unthread left pants leg Pants- Performed by helper: Pull pants up/down Shoes - Performed by helper: Don/doff right shoe, Don/doff left shoe, Fasten right, Fasten left TED Hose - Performed by helper: Don/doff right TED hose, Don/doff left TED hose Assist for footwear: Supervision/touching assist Assist for lower body dressing: Touching or steadying assistance (Pt > 75%)  Function - Toileting Toileting activity did not occur: No continent bowel/bladder event Toileting steps completed by patient: Adjust clothing prior to toileting Toileting steps completed by helper: Performs perineal hygiene, Adjust clothing after toileting Assist level: Two helpers  Function - Air cabin crew transfer activity did not occur: N/A Assist level to toilet: 2 helpers(per Wyvonnia Lora, NT) Assist level from toilet: 2 helpers  Function - Chair/bed transfer Chair/bed transfer activity did not occur: N/A Chair/bed transfer method: Lateral scoot Chair/bed transfer assist level: Moderate assist (Pt 50 - 74%/lift or lower) Chair/bed transfer assistive device: Sliding board Chair/bed transfer details: Manual facilitation for placement, Manual facilitation for weight shifting, Verbal cues for technique, Tactile cues for sequencing, Tactile cues for weight shifting  Function - Locomotion: Wheelchair Will patient use wheelchair at discharge?: Yes Type: Manual Max wheelchair distance: 150' Assist Level: No help, No cues, assistive device, takes more than reasonable amount of  time Wheel 50 feet with 2 turns activity did not occur: Safety/medical concerns Assist Level: No help, No cues, assistive device, takes more than reasonable amount of time Wheel 150 feet activity did not occur: Safety/medical concerns Assist Level: No help, No cues, assistive device, takes more than reasonable amount of time Turns around,maneuvers to table,bed, and toilet,negotiates 3% grade,maneuvers on rugs and over doorsills: Yes Function - Locomotion: Ambulation Ambulation activity did not occur: Safety/medical concerns Walk 10 feet activity did not occur: Safety/medical concerns Walk 50 feet with 2 turns activity did not occur: Safety/medical concerns Walk 150 feet activity did not occur: Safety/medical concerns Walk 10 feet on uneven surfaces activity did not occur: Safety/medical concerns  Function - Comprehension Comprehension: Auditory Comprehension assist level: Understands complex 90% of the time/cues 10% of the time  Function - Expression Expression: Verbal Expression assist level: Expresses basic needs/ideas: With extra time/assistive device  Function - Social Interaction Social Interaction assist level: Interacts appropriately 90% of the time - Needs monitoring or encouragement for participation or interaction.  Function - Problem Solving Problem solving assist level: Solves complex 90% of the time/cues < 10% of the time  Function - Memory Memory assist level: Complete Independence:  No helper Patient normally able to recall (first 3 days only): Current season, Location of own room, Staff names and faces, That he or she is in a hospital   Medical Problem List and Plan: 1.  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 Cont CIR, PT,  OT.    -continues to progress toward goals    2.  DVT Prophylaxis/Anticoagulation/history of pulmonary emboli: Xarelto.  Monitor for  any bleeding episodes 3. Pain Management/spasticity:   -pt with lethargy and nausea reduce baclofen to 58m TID and 285mqhs- baclofen on hold no severe spasms will d/c for now  -continue tramadol for more severe pain  -continue tylenol prn 4. Mood: Zoloft 25 mg daily.  Provide emotional support 5. Neuropsych: This patient is capable of making decisions on her own behalf. 6. Skin/Wound Care: Routine skin checks, Left knee incision well healed 7. Fluids/Electrolytes/Nutrition: check labs tomorrow 8.  Left femoral shaft fracture.  Status post intramedullary retrograde nailing 09/11/2017.  Nonweightbearing.? X 6wks 9.  Acute blood loss anemia.  Follow-up hgb 7.8 on 7/16---repeat CBC hgb 7.0 if further drop rec transfusion 10.  Hyperglycemia secondary to steroid.  improved  -dc'ed cbg checks and SSI 11.  Neurogenic bowel/ bladder.    - PM bowel program with some improvement  -increased ditropan to TID  -100k Ecoli: macrobid initiated 7/11--->7 day course--abx completed  -RN working on self-cath in w/c---increased baclofen as above to help make process easier  -may be home alone from 7am to 1pm a few days a week    12.  Hypothyroidism.  Synthroid 13. Thrombocytopenia - PLT up to 104 K on 7/20, actually has pancytopenia, had normal WBC ~1 mo ago, recheck normal 155K 14.  Sinus symptoms, flonase, added ocean nasal spray , add claritin 15.  Severe spasticity due to SCI cannot tolerate higher dose baclofen due to lethargy consider Botox LOS (Days) 22 A FACE TO FACE EVALUATION WAS PERFORMED  AnCharlett Blake/23/2019, 8:02 AM

## 2017-10-13 NOTE — Plan of Care (Signed)
  Problem: Consults Goal: RH SPINAL CORD INJURY PATIENT EDUCATION Description  See Patient Education module for education specifics.  Outcome: Progressing Goal: Skin Care Protocol Initiated - if Braden Score 18 or less Description If consults are not indicated, leave blank or document N/A Outcome: Progressing Goal: Nutrition Consult-if indicated Outcome: Progressing   Problem: RH SKIN INTEGRITY Goal: RH STG SKIN FREE OF INFECTION/BREAKDOWN Description Skin wound healing and no new skin breakdown with min assistance  Outcome: Progressing Goal: RH STG MAINTAIN SKIN INTEGRITY WITH ASSISTANCE Description STG Maintain Skin Integrity With min Assistance.  Outcome: Progressing Goal: RH STG ABLE TO PERFORM INCISION/WOUND CARE W/ASSISTANCE Description STG Able To Perform Incision/Wound Care With mod Assistance.  Outcome: Progressing   Problem: RH SAFETY Goal: RH STG ADHERE TO SAFETY PRECAUTIONS W/ASSISTANCE/DEVICE Description STG Adhere to Safety Precautions With min Assistance/Device.  Outcome: Progressing   Problem: RH PAIN MANAGEMENT Goal: RH STG PAIN MANAGED AT OR BELOW PT'S PAIN GOAL Description Pain < or = 4 with min assistance  Outcome: Progressing   Problem: RH KNOWLEDGE DEFICIT SCI Goal: RH STG INCREASE KNOWLEDGE OF SELF CARE AFTER SCI Description Patient able to verbalize safe care activities with min assistance   Outcome: Progressing   Problem: SCI BOWEL ELIMINATION Goal: RH STG MANAGE BOWEL WITH ASSISTANCE Description STG Manage Bowel with mod Assistance.  Outcome: Not Progressing Goal: RH STG SCI MANAGE BOWEL WITH MEDICATION WITH ASSISTANCE Description STG SCI Manage bowel with medication with mod assistance.  Outcome: Not Progressing Goal: RH STG SCI MANAGE BOWEL PROGRAM W/ASSIST OR AS APPROPRIATE Description STG SCI Manage bowel program w/assist mod   Outcome: Not Progressing   Problem: SCI BLADDER ELIMINATION Goal: RH STG MANAGE BLADDER WITH  ASSISTANCE Description STG Manage Bladder With mod Assistance  Outcome: Not Progressing

## 2017-10-14 ENCOUNTER — Inpatient Hospital Stay (HOSPITAL_COMMUNITY): Payer: Medicare Other | Admitting: Physical Therapy

## 2017-10-14 ENCOUNTER — Inpatient Hospital Stay (HOSPITAL_COMMUNITY): Payer: Medicare Other | Admitting: Occupational Therapy

## 2017-10-14 MED ORDER — BACLOFEN 5 MG HALF TABLET
5.0000 mg | ORAL_TABLET | Freq: Three times a day (TID) | ORAL | Status: DC
  Administered 2017-10-14 – 2017-10-16 (×5): 5 mg via ORAL
  Filled 2017-10-14 (×5): qty 1

## 2017-10-14 MED ORDER — BACLOFEN 5 MG HALF TABLET
5.0000 mg | ORAL_TABLET | Freq: Two times a day (BID) | ORAL | Status: DC
  Administered 2017-10-14: 5 mg via ORAL
  Filled 2017-10-14: qty 1

## 2017-10-14 MED ORDER — OXYBUTYNIN CHLORIDE 5 MG PO TABS
2.5000 mg | ORAL_TABLET | Freq: Three times a day (TID) | ORAL | Status: DC
  Administered 2017-10-14 – 2017-10-24 (×32): 2.5 mg via ORAL
  Filled 2017-10-14 (×32): qty 1

## 2017-10-14 MED ORDER — NITROFURANTOIN MACROCRYSTAL 50 MG PO CAPS
50.0000 mg | ORAL_CAPSULE | Freq: Four times a day (QID) | ORAL | Status: DC
  Administered 2017-10-14 – 2017-10-15 (×4): 50 mg via ORAL
  Filled 2017-10-14 (×5): qty 1

## 2017-10-14 NOTE — Progress Notes (Signed)
Physical Therapy Session Note  Patient Details  Name: Sarah [name] MRN: [id number] Date of Birth: November 09, 1965  Today's Date: 10/14/2017 PT Individual Time: 1500-1510 PT Individual Time Calculation (min): 10 min   Short Term Goals: Week 3:  PT Short Term Goal 1 (Week 3): LTG=STG due to ELOS  Skilled Therapeutic Interventions/Progress Updates:    no c/o pain but reports ongoing issues with bowel incontinence.  Pt declined out of bed mobility due to bowel issues.  PT had in depth discussion with patient regarding functional decline over the last several days and possibility of extending LOS to return to mod assist level.  Also discussed challenges with pt's recent cognitive changes due to medications, and challenges with bowel program (pt usually does suppository and dig stim in the evenings and has reported several times throughout LOS it has not been done), and provided emotional support.  Missed 35 minutes of skilled PT.   Therapy Documentation Precautions:  Precautions Precautions: Fall Precaution Comments: throracic region, LEs spasicity  Restrictions Weight Bearing Restrictions: Yes LLE Weight Bearing: Touchdown weight bearing Other Position/Activity Restrictions: LLE NWB General: PT Amount of Missed Time (min): 35 Minutes PT Missed Treatment Reason: Bowel/bladder accident(bowel incontinence with straining for mobility)   See Function Navigator for Current Functional Status.   Therapy/Group: Individual Therapy  Michel Santee 10/14/2017, 4:23 PM

## 2017-10-14 NOTE — Plan of Care (Signed)
  Problem: SCI BOWEL ELIMINATION Goal: RH STG MANAGE BOWEL WITH ASSISTANCE Description STG Manage Bowel with mod Assistance.  Outcome: Progressing  Administering bowel regimen as ordered  Problem: SCI BLADDER ELIMINATION Goal: RH STG MANAGE BLADDER WITH ASSISTANCE Description STG Manage Bladder With mod Assistance  Outcome: Progressing  Bladder scan and in/out cath as needed   Problem: RH SKIN INTEGRITY Goal: RH STG SKIN FREE OF INFECTION/BREAKDOWN Description Skin wound healing and no new skin breakdown with min assistance  Outcome: Progressing  Continue to assess skin as needed   Problem: RH SAFETY Goal: RH STG ADHERE TO SAFETY PRECAUTIONS W/ASSISTANCE/DEVICE Description STG Adhere to Safety Precautions With min Assistance/Device.  Outcome: Progressing  Bed/chair alarm, proper footwear, proper transfers

## 2017-10-14 NOTE — Progress Notes (Signed)
Subjective/Complaints:  Back to baseline with MS, discussed recurrent UTI , pt concerned about not voiding between caths, is on Ditropan 72m TID  ROS: Patient denies  nausea, vomiting, diarrhea, cough, shortness of breath or chest pain, joint or back pain,  . .    Objective: Vital Signs: Blood pressure 108/78, pulse 80, temperature 98.6 F (37 C), temperature source Oral, resp. rate 18, height 5' 11"  (1.803 m), weight 103.2 kg (227 lb 9.8 oz), last menstrual period 10/22/2016, SpO2 97 %. Dg Femur Port Min 2 Views Left  Result Date: 10/13/2017 CLINICAL DATA:  52year old female with distal left femur fracture treated with intramedullary rod 09/11/2017. Subsequent encounter. EXAM: LEFT FEMUR PORTABLE 2 VIEWS COMPARISON:  09/11/2017. FINDINGS: Post placement of left femoral intramedullary rod with proximal and distal fixation screws for treatment of oblique fracture of the distal left femoral diametaphysis. Overall similar appearance to prior exam without evidence of radiographic healing. Areas of sclerosis and osteopenia unchanged. IMPRESSION: Similar appearance of distal left femur fracture post internal fixation. Electronically Signed   By: SGenia DelM.D.   On: 10/13/2017 10:47   Results for orders placed or performed during the hospital encounter of 09/21/17 (from the past 72 hour(s))  Glucose, capillary     Status: None   Collection Time: 10/11/17  3:43 PM  Result Value Ref Range   Glucose-Capillary 94 70 - 99 mg/dL  Basic metabolic panel     Status: Abnormal   Collection Time: 10/12/17  2:10 PM  Result Value Ref Range   Sodium 140 135 - 145 mmol/L   Potassium 4.0 3.5 - 5.1 mmol/L   Chloride 106 98 - 111 mmol/L   CO2 27 22 - 32 mmol/L   Glucose, Bld 89 70 - 99 mg/dL   BUN 12 6 - 20 mg/dL   Creatinine, Ser 0.89 0.44 - 1.00 mg/dL   Calcium 7.7 (L) 8.9 - 10.3 mg/dL   GFR calc non Af Amer >60 >60 mL/min   GFR calc Af Amer >60 >60 mL/min    Comment: (NOTE) The eGFR has been  calculated using the CKD EPI equation. This calculation has not been validated in all clinical situations. eGFR's persistently <60 mL/min signify possible Chronic Kidney Disease.    Anion gap 7 5 - 15    Comment: Performed at [id number], [place] [id number] CBC     Status: Abnormal   Collection Time: 10/12/17  2:10 PM  Result Value Ref Range   WBC 2.8 (L) 4.0 - 10.5 K/uL   RBC 2.16 (L) 3.87 - 5.11 MIL/uL   Hemoglobin 7.0 (L) 12.0 - 15.0 g/dL   HCT 21.8 (L) 36.0 - 46.0 %   MCV 100.9 (H) 78.0 - 100.0 fL   MCH 32.4 26.0 - 34.0 pg   MCHC 32.1 30.0 - 36.0 g/dL   RDW 19.6 (H) 11.5 - 15.5 %   Platelets 155 150 - 400 K/uL    Comment: Performed at [id number], [place] [id number] Urinalysis, Routine w reflex microscopic     Status: Abnormal   Collection Time: 10/13/17  8:02 AM  Result Value Ref Range   Color, Urine YELLOW YELLOW   APPearance HAZY (A) CLEAR   Specific Gravity, Urine 1.013 1.005 - 1.030   pH 5.0 5.0 - 8.0   Glucose, UA NEGATIVE NEGATIVE mg/dL   Hgb urine dipstick NEGATIVE NEGATIVE   Bilirubin Urine NEGATIVE NEGATIVE  Ketones, ur 20 (A) NEGATIVE mg/dL   Protein, ur NEGATIVE NEGATIVE mg/dL   Nitrite POSITIVE (A) NEGATIVE   Leukocytes, UA TRACE (A) NEGATIVE   RBC / HPF 0-5 0 - 5 RBC/hpf   WBC, UA 21-50 0 - 5 WBC/hpf   Bacteria, UA MANY (A) NONE SEEN   Squamous Epithelial / LPF 0-5 0 - 5   WBC Clumps PRESENT    Mucus PRESENT     Comment: Performed at [facility], [place] [address] [place], [place] [address]  Urine Culture     Status: Abnormal (Preliminary result)   Collection Time: 10/13/17  8:53 AM  Result Value Ref Range   Specimen Description URINE, CATHETERIZED    Special Requests      NONE Performed at [facility], [place] [address], [place], [place] [address]    Culture >=100,000 COLONIES/mL GRAM NEGATIVE RODS (A)    Report Status PENDING    *Note: Due to a large number of results  and/or encounters for the requested time period, some results have not been displayed. A complete set of results can be found in Results Review.     Constitutional: No distress . Vital signs reviewed. obese HEENT: EOMI, oral membranes moist Neck: supple Cardiovascular: RRR without murmur. No JVD    Respiratory: CTA Bilaterally without wheezes or rales. Normal effort    GI: BS +, non-tender, non-distended  Neuro: Alert/Oriented person place month day and year not date, Abnormal Sensory absent motor and sensory below T2 and Abnormal Motor 5/5 BUE, 0/5 BLE, except trace knee ext Bilaterally Musc/Skel:  Other no pain with UE AROM, no pain with LE PROM Psych: pleasant Skin: surgical wound healing and intact   Assessment/Plan: 1. Functional deficits secondary to T3 paraplegia which require 3+ hours per day of interdisciplinary therapy in a comprehensive inpatient rehab setting. Physiatrist is providing close team supervision and 24 hour management of active medical problems listed below. Physiatrist and rehab team continue to assess barriers to discharge/monitor patient progress toward functional and medical goals. FIM: Function - Bathing Position: Bed Body parts bathed by patient: Right arm, Left arm, Chest, Abdomen, Right upper leg, Left upper leg, Right lower leg, Left lower leg Body parts bathed by helper: Buttocks Bathing not applicable: Front perineal area, Buttocks Assist Level: Set up, Supervision or verbal cues Assistive Device Comment: LH sponge and reacher Set up : To obtain items  Function- Upper Body Dressing/Undressing What is the patient wearing?: Pull over shirt/dress Bra - Perfomed by patient: Thread/unthread right bra strap, Thread/unthread left bra strap, Hook/unhook bra (pull down sports bra) Bra - Perfomed by helper: Hook/unhook bra (pull down sports bra) Pull over shirt/dress - Perfomed by patient: Thread/unthread right sleeve, Thread/unthread left sleeve, Put head  through opening, Pull shirt over trunk Pull over shirt/dress - Perfomed by helper: Pull shirt over trunk Assist Level: Set up, Supervision or verbal cues Set up : To obtain clothing/put away Function - Lower Body Dressing/Undressing What is the patient wearing?: Pants, Ted Hose Position: Bed Pants- Performed by patient: Thread/unthread right pants leg, Thread/unthread left pants leg, Pull pants up/down Pants- Performed by helper: Pull pants up/down Shoes - Performed by helper: Don/doff right shoe, Don/doff left shoe, Fasten right, Fasten left TED Hose - Performed by helper: Don/doff right TED hose, Don/doff left TED hose Assist for footwear: Supervision/touching assist Assist for lower body dressing: Supervision or verbal cues, Set up  Function - Toileting Toileting activity did not occur: No continent bowel/bladder  event Toileting steps completed by patient: Adjust clothing prior to toileting Toileting steps completed by helper: Performs perineal hygiene, Adjust clothing after toileting Assist level: Two helpers  Function - Air cabin crew transfer activity did not occur: N/A Assist level to toilet: 2 helpers(per Wyvonnia Lora, NT) Assist level from toilet: 2 helpers  Function - Chair/bed transfer Chair/bed transfer activity did not occur: N/A Chair/bed transfer method: Lateral scoot Chair/bed transfer assist level: Moderate assist (Pt 50 - 74%/lift or lower) Chair/bed transfer assistive device: Sliding board Chair/bed transfer details: Manual facilitation for placement, Manual facilitation for weight shifting, Verbal cues for technique, Tactile cues for sequencing, Tactile cues for weight shifting  Function - Locomotion: Wheelchair Will patient use wheelchair at discharge?: Yes Type: Manual Max wheelchair distance: 150' Assist Level: No help, No cues, assistive device, takes more than reasonable amount of time Wheel 50 feet with 2 turns activity did not occur:  Safety/medical concerns Assist Level: No help, No cues, assistive device, takes more than reasonable amount of time Wheel 150 feet activity did not occur: Safety/medical concerns Assist Level: No help, No cues, assistive device, takes more than reasonable amount of time Turns around,maneuvers to table,bed, and toilet,negotiates 3% grade,maneuvers on rugs and over doorsills: Yes Function - Locomotion: Ambulation Ambulation activity did not occur: Safety/medical concerns Walk 10 feet activity did not occur: Safety/medical concerns Walk 50 feet with 2 turns activity did not occur: Safety/medical concerns Walk 150 feet activity did not occur: Safety/medical concerns Walk 10 feet on uneven surfaces activity did not occur: Safety/medical concerns  Function - Comprehension Comprehension: Auditory Comprehension assist level: Understands complex 90% of the time/cues 10% of the time  Function - Expression Expression: Verbal Expression assist level: Expresses basic needs/ideas: With extra time/assistive device  Function - Social Interaction Social Interaction assist level: Interacts appropriately 90% of the time - Needs monitoring or encouragement for participation or interaction.  Function - Problem Solving Problem solving assist level: Solves complex 90% of the time/cues < 10% of the time  Function - Memory Memory assist level: Complete Independence: No helper Patient normally able to recall (first 3 days only): Current season, Location of own room, Staff names and faces, That he or she is in a hospital   Medical Problem List and Plan: 1.  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 Cont CIR, PT,  OT.    Team conference today please see physician documentation under team conference tab, met with team face-to-face to discuss problems,progress, and goals. Formulized individual treatment plan  based on medical history, underlying problem and comorbidities.    2.  DVT Prophylaxis/Anticoagulation/history of pulmonary emboli: Xarelto.  Monitor for any bleeding episodes 3. Pain Management/spasticity:   -pt with lethargy and nausea reduce baclofen to 37m TID and 216mqhs- baclofen 23m32mID, 6m68mS -continue tramadol for more severe pain  -continue tylenol prn 4. Mood: Zoloft 25 mg daily.  Provide emotional support 5. Neuropsych: This patient is capable of making decisions on her own behalf. 6. Skin/Wound Care: Routine skin checks, Left knee incision well healed 7. Fluids/Electrolytes/Nutrition: check labs tomorrow 8.  Left femoral shaft fracture.  Status post intramedullary retrograde nailing 09/11/2017.  Nonweightbearing.may be WBAAT per ortho 9.  Acute blood loss anemia.  Follow-up hgb 7.8 on 7/16---repeat CBC hgb 7.0 if further drop rec transfusion 10.  Hyperglycemia secondary to steroid.  improved  -dc'ed cbg checks and SSI 11.  Neurogenic bowel/  bladder.    - PM bowel program with some improvement  -increased ditropan to TID  -100k GNR restart macrodantin pending cultures  12.  Hypothyroidism.  Synthroid 13. Thrombocytopenia - PLT up to 104 K on 7/20, actually has pancytopenia, had normal WBC ~1 mo ago, recheck normal 155K 14.  Sinus symptoms, flonase, added ocean nasal spray , add claritin 15.  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

## 2017-10-14 NOTE — Progress Notes (Signed)
Physical Therapy Session Note  Patient Details  Name: MADLINE OESTERLING MRN: [id number] Date of Birth: Jun 19, 1965  Today's Date: 10/14/2017 PT Individual Time: 1115-1205 PT Individual Time Calculation (min): 50 min   Short Term Goals: Week 3:  PT Short Term Goal 1 (Week 3): LTG=STG due to ELOS  Skilled Therapeutic Interventions/Progress Updates:    no c/o pain but reporting night RN refused PM bowel program and having episodes of bowel incontinence throughout the day.  Session focus on UE strengthening through functional transfers and w/c propulsion.  Pt mod I for w/c propulsion throughout unit with BUEs.  Slide board transfer w/c<>mat and w/c>bed with +2 assist, pt demos decreased forward weight shift and push through UEs compared to performance prior to missed therapy earlier this week.  Suspect due to prolonged inactivity decreased core and UE strength.  Bed mobility rolling R and L with mod assist for LEs for hygiene and clothing management following incontinent bowel episode.  Left positioned in bed with tray set up for lunch, call bell in reach and needs met.   Therapy Documentation Precautions:  Precautions Precautions: Fall Precaution Comments: throracic region, LEs spasicity  Restrictions Weight Bearing Restrictions: Yes LLE Weight Bearing: Touchdown weight bearing Other Position/Activity Restrictions: LLE NWB   See Function Navigator for Current Functional Status.   Therapy/Group: Individual Therapy  Michel Santee 10/14/2017, 12:11 PM

## 2017-10-14 NOTE — Progress Notes (Signed)
Occupational Therapy Session Note  Patient Details  Name: Sarah [name] MRN: [id number] Date of Birth: [birth year]  Today's Date: 10/14/2017 OT Individual Time: 0945-1100 and 1400-1430 OT Individual Time Calculation (min): 75 min and 30 min  15 missed minutes in PM session   Skilled Therapeutic Interventions/Progress Updates:    Session 1: Upon entering the room, pt with no c/o pain and agreeable to OT intervention. Pt bathing self from bed level with set up A for items needed. Pt utilized reacher and LH sponge to increase I with LB self care. Pt required min A for rolling but able to pull pants over B hips with increased time. Pt verbalized, " I think I used the bathroom. " Pt having BM in brief and rolling L <> R to remove clothing items and hygiene. Pt performed supine >sit with min A for trunk but utilized leg loops to pull B LE's off bed. Pt seated on EOB with close supervision. OT holding L LE up and pt placing slide board. Pt required max curing regarding head/hip relationship. Pt needing total A to slide board to the R into wheelchair. Pt seated at sink for grooming tasks with set up A to obtain all needed items. Call bell and all needed items within reach upon exiting the room.   Session 2: Upon entering the room, pt in bed on bedpan and having multiple BM's this afternoon. Pt rolling L <> R to remove bed pan with min A and use of grab bars. OT performed hygiene for thoroughness. Pt declined to don LB clothing secondary to stomach pains and possible BM in the future. OT notified RN. Pt remain in bed with call bell and all needed items within reach upon exiting the room.   Therapy Documentation Precautions:  Precautions Precautions: Fall Precaution Comments: throracic region, LEs spasicity  Restrictions Weight Bearing Restrictions: Yes LLE Weight Bearing: Touchdown weight bearing Other Position/Activity Restrictions: LLE NWB ADL: ADL ADL Comments: see functional navigator  See  Function Navigator for Current Functional Status.   Therapy/Group: Individual Therapy  [name] 10/14/2017, 12:39 PM

## 2017-10-14 NOTE — Progress Notes (Signed)
Recreational Therapy Session Note  Patient Details  Name: Sarah [name] MRN: [id number] Date of Birth: December 01, 1965 Today's Date: 10/14/2017 Time:  245-310 Pain: no c/o Skilled Therapeutic Interventions/Progress Updates: Session focused on discharge planning with pt identifying concerns including bowel and bladder management & transfers to various surface types.  Pt stated that she felt like she has lost a lot of strength this past week due to various medical issues in which she has remained in bed the majority of the time.  Discussed with team, team to share with [place] 10/14/2017, 4:31 PM

## 2017-10-14 NOTE — Progress Notes (Signed)
Orthopedic Tech Progress Note Patient Details:  Sarah [name] February 04, 1966 [id number]  Ortho Devices Type of Ortho Device: Prafo boot/shoe Ortho Device/Splint Location: bilateral Ortho Device/Splint Interventions: Application   Post Interventions Patient Tolerated: Well Instructions Provided: Care of device   Hildred Priest 10/14/2017, 10:10 AM

## 2017-10-14 NOTE — Progress Notes (Signed)
Physical Therapy Session Note  Patient Details  Name: DEWANA AMMIRATI MRN: [id number] Date of Birth: 02/04/1966  Today's Date: 10/14/2017 PT Individual Time: 0900-0925 PT Individual Time Calculation (min): 25 min   Short Term Goals: Week 3:  PT Short Term Goal 1 (Week 3): LTG=STG due to ELOS  Skilled Therapeutic Interventions/Progress Updates:  Pt received in bed & agreeable to tx to make up missed time from previous date. Provided pt with BLE stretching program handout and reviewed stretches & stretching regimen with pt. Stretches included: hip/knee stretching, hip abduction, ankle dorsiflexion/toe extension, and hamstring stretches. Increased resistance noted during stretches 2/2 pt's increased tone & muscle spasms with LE movement. Pt also with minimal dorsiflexion & PRAFO boots requested from MD. Pt reports she feels comfortable instructing her sister on how to assist her with with daily stretching program. Pt left in bed with 4 rails up (pt request), call bell in reach, & bed alarm set.   Therapy Documentation Precautions:  Precautions Precautions: Fall Precaution Comments: throracic region, LEs spasicity  Restrictions Weight Bearing Restrictions: Yes LLE Weight Bearing: Touchdown weight bearing Other Position/Activity Restrictions: LLE NWB   See Function Navigator for Current Functional Status.   Therapy/Group: Individual Therapy  Waunita Schooner 10/14/2017, 9:34 AM

## 2017-10-14 NOTE — Progress Notes (Signed)
Social Work Patient ID: Sarah [name], female   DOB: 1965-07-16, 52 y.o.   MRN: [id number]  Team feels pt needs one more week 8/3 to reach goals of mod assist. Currently she is total plus 2. MD has agreed to one week extension til 8/3.

## 2017-10-15 ENCOUNTER — Inpatient Hospital Stay (HOSPITAL_COMMUNITY): Payer: Medicare Other | Admitting: Physical Therapy

## 2017-10-15 ENCOUNTER — Inpatient Hospital Stay (HOSPITAL_COMMUNITY): Payer: Medicare Other | Admitting: Occupational Therapy

## 2017-10-15 LAB — URINE CULTURE: Culture: >=100000 — AB

## 2017-10-15 MED ORDER — SULFAMETHOXAZOLE-TRIMETHOPRIM 400-80 MG PO TABS: 1.0000 | ORAL_TABLET | Freq: Two times a day (BID) | ORAL | Status: DC

## 2017-10-15 MED ORDER — POLYETHYLENE GLYCOL 3350 17 G PO PACK: 17.0000 g | PACK | Freq: Every day | ORAL | Status: DC | PRN

## 2017-10-15 MED ORDER — SULFAMETHOXAZOLE-TRIMETHOPRIM 800-160 MG PO TABS
1.0000 | ORAL_TABLET | Freq: Two times a day (BID) | ORAL | Status: DC
  Administered 2017-10-15 – 2017-10-18 (×7): 1 via ORAL
  Filled 2017-10-15 (×9): qty 1

## 2017-10-15 MED ORDER — MAGNESIUM HYDROXIDE 400 MG/5ML PO SUSP
30.0000 mL | Freq: Every day | ORAL | Status: DC | PRN
  Administered 2017-10-15: 30 mL via ORAL
  Filled 2017-10-15: qty 30

## 2017-10-15 NOTE — Progress Notes (Signed)
Occupational Therapy Weekly Progress Note  Patient Details  Name: Sarah [name] MRN: [id number] Date of Birth: 11/16/1965  Beginning of progress report period: October 08, 2017 End of progress report period: October 15, 2017   Patient has met 0 of 10 long term goals.  Short term goals not set due to estimated length of stay. Pt has had multiple barriers this week towards occupational therapy goals and participation. Pt's barriers include altered mental status due to medication changes/reaction, nausea, vomiting, and issues with bowel program leading to functional declined. Pt currently requires set up A - min A for bathing and dressing from bed level. Functional transfers with slide board requiring total A at this time. Pt able to place slide board with assistance to lift LE. Pt continues to be fearful with transfers as well. Pt would continued to benefit from OT intervention to increase safety, strength, and decreased caregiver burden.   Patient continues to demonstrate the following deficits: muscle weakness, decreased cardiorespiratoy endurance and decreased sitting balance, decreased standing balance, decreased postural control and decreased balance strategies and therefore will continue to benefit from skilled OT intervention to enhance overall performance with BADL.  See Patient's Care Plan for progression toward long term goals.  Patient not progressing toward long term goals.  See goal revision..  Toileting and toilet transfers downgraded to max A with use of slide board and LB dressing goal upgraded to overall min A.       Therapy Documentation Precautions:  Precautions Precautions: Fall Precaution Comments: throracic region, LEs spasicity  Restrictions Weight Bearing Restrictions: Yes LLE Weight Bearing: Touchdown weight bearing Other Position/Activity Restrictions: LLE NWB General: General OT Amount of Missed Time: 45 Minutes Vital Signs: Therapy Vitals Temp: 98.4 F (36.9  C) Temp Source: Oral Pulse Rate: 90 Resp: 15 BP: 123/77 Oxygen Therapy SpO2: 98 % Pain:   ADL: ADL ADL Comments: see functional navigator  See Function Navigator for Current Functional Status.    Darleen Crocker P 10/15/2017, 8:02 AM

## 2017-10-15 NOTE — Progress Notes (Signed)
Occupational Therapy Session Note  Patient Details  Name: Sarah [name] MRN: [id number] Date of Birth: Dec 30, 1965  Today's Date: 10/15/2017 OT Individual Time: 3837-7939 OT Individual Time Calculation (min): 28 min  and Today's Date: 10/15/2017 OT Missed Time: 52 Minutes Missed Time Reason: Patient ill (comment)(consipation)   Short Term Goals: Week 3:  OT Short Term Goal 1 (Week 3): STGs = LTGs  Skilled Therapeutic Interventions/Progress Updates:    Upon entering the room, pt supine in bed with c/o not feeling well. Pt reports, " My stomach is cramping and i'm just full of it." Pt declines transfers and self care secondary to feeling unwell. OT discussed possible options and recommended intervention from RN for relief and to possibily be able to participate in afternoon therapies. Pt was agreeable to this plan. OT also discussing current OT goals and pt verbalizing desire to try drop arm commode transfer with slide board. She feels very fearful of padded tub bench for bowel program. OT notifying RN of pt needs and pt remained supine with call bell and all needed items within reach.   Therapy Documentation Precautions:  Precautions Precautions: Fall Precaution Comments: throracic region, LEs spasicity  Restrictions Weight Bearing Restrictions: Yes LLE Weight Bearing: Touchdown weight bearing Other Position/Activity Restrictions: LLE NWB General: General OT Amount of Missed Time: 45 Minutes Vital Signs: Therapy Vitals Temp: 98.4 F (36.9 C) Temp Source: Oral Pulse Rate: 90 Resp: 15 BP: 123/77 Oxygen Therapy SpO2: 98 % Pain:   ADL: ADL ADL Comments: see functional navigator  See Function Navigator for Current Functional Status.   Therapy/Group: Individual Therapy  [name] 10/15/2017, 7:52 AM

## 2017-10-15 NOTE — Progress Notes (Signed)
Physical Therapy Session Note  Patient Details  Name: Sarah [name] MRN: [id number] Date of Birth: November 16, 1965  Today's Date: 10/15/2017 PT Individual Time: 0900-1000 & 1500-1600 PT Individual Time Calculation (min): 60 min & 60 min  Short Term Goals: Week 3:  PT Short Term Goal 1 (Week 3): LTG=STG due to ELOS  Skilled Therapeutic Interventions/Progress Updates:    Session 1: Pt reported no pain prior to treatment session onset. Pt requested no out of bed activity due to her stomach is "full", "hard as a rock" and she feels miserable. Therapist initiated bed level bathing providing total assist for lower half of B LE's for washing and set up assist for upper thighs, trunk, and UE's. Pt performed rolling L & R to allow therapist to assess if brief needed to be changed. Pt did not have any BM during this session but reports she is waiting for the prune juice to kick in. Therapist applied ted's & leg loops, total A,  and LE pants with mod A while Pt is rolling in both directions. Pt performed supine<>sitting EOB using R LE tone and leg loops with HOB fully elevated for efficiency and therapist providing min A. Pt provided min A using chuck pad to assist with Pt shifting weight in both directions until LE's were supported by 3in block. Pt was returned to supine using leg loops and min A for L LE.  PRAFO's applied in supine and tray table and call bell in reach and all needs met.   Session 2: Pt reported no pain prior to treatment session onset. Pt performed supine>sitting EOB with min A for LE's using leg loops and HOB elevated for increased efficiency. Pt able to verbalize correct sequencing for lateral scoot transfer with slide board from bed>w/c with 2 person assist for initial lateral boost and 3in block for LE support. Pt requires multimodal assist for proper hand placement during transfers and maintaining forward trunk flexion for head hip relationship. Pt propels w/c<> treatment gym ~300 feet with  mod I. PT initiates x2 lateral scoot transfers from w/c<> treatment mat transferring on the decline for increased efficiency and 1 in block for LE support. Pt requires max A for lateral scoot transfers and for board placement. PT initiated transfer with beeasy board for increased efficiency for x1 trial. Lateral scoot transfer with this device continued to require max A plus 1 with Pt indicating she felt unsafe due to lack of control felt with moving board. Pt returned Pt to w/c using beeasy with max A. Pt requires 2 person A to posteriorly weight shift in chair for proper posturing. Pt propelled w/c back to room and towel roll was placed to lumbar spine for reduction of sacral pressure. Pt remained in chair with tray table and call bell in reach and all needs met. Nursing staff notified that IV was bloody and needed to be removed.  Therapy Documentation Precautions:  Precautions Precautions: Fall Precaution Comments: throracic region, LEs spasicity  Restrictions Weight Bearing Restrictions: Yes LLE Weight Bearing: Touchdown weight bearing Other Position/Activity Restrictions: LLE NWB   See Function Navigator for Current Functional Status.   Therapy/Group: Individual Therapy  Floreen Comber 10/15/2017, 4:39 PM

## 2017-10-15 NOTE — Progress Notes (Signed)
Subjective/Complaints: Was placed on IVF 7/22 for poor intake related to lethargy.  Now back to baseline with mental status Discussed her bladder program and purpose of ditropan to keep pt dry between caths  ROS: Patient denies  nausea, vomiting, diarrhea, cough, shortness of breath or chest pain, joint or back pain,  . .    Objective: Vital Signs: Blood pressure 123/77, pulse 90, temperature 98.4 F (36.9 C), temperature source Oral, resp. rate 15, height 5' 11"  (1.803 m), weight 103.2 kg (227 lb 9.8 oz), last menstrual period 10/22/2016, SpO2 98 %. Dg Femur Port Min 2 Views Left  Result Date: 10/13/2017 CLINICAL DATA:  52 year old female with distal left femur fracture treated with intramedullary rod 09/11/2017. Subsequent encounter. EXAM: LEFT FEMUR PORTABLE 2 VIEWS COMPARISON:  09/11/2017. FINDINGS: Post placement of left femoral intramedullary rod with proximal and distal fixation screws for treatment of oblique fracture of the distal left femoral diametaphysis. Overall similar appearance to prior exam without evidence of radiographic healing. Areas of sclerosis and osteopenia unchanged. IMPRESSION: Similar appearance of distal left femur fracture post internal fixation. Electronically Signed   By: Genia Del M.D.   On: 10/13/2017 10:47   Results for orders placed or performed during the hospital encounter of 09/21/17 (from the past 72 hour(s))  Basic metabolic panel     Status: Abnormal   Collection Time: 10/12/17  2:10 PM  Result Value Ref Range   Sodium 140 135 - 145 mmol/L   Potassium 4.0 3.5 - 5.1 mmol/L   Chloride 106 98 - 111 mmol/L   CO2 27 22 - 32 mmol/L   Glucose, Bld 89 70 - 99 mg/dL   BUN 12 6 - 20 mg/dL   Creatinine, Ser 0.89 0.44 - 1.00 mg/dL   Calcium 7.7 (L) 8.9 - 10.3 mg/dL   GFR calc non Af Amer >60 >60 mL/min   GFR calc Af Amer >60 >60 mL/min    Comment: (NOTE) The eGFR has been calculated using the CKD EPI equation. This calculation has not been  validated in all clinical situations. eGFR's persistently <60 mL/min signify possible Chronic Kidney Disease.    Anion gap 7 5 - 15    Comment: Performed at [facility] [address], [place], [place] [address]  CBC     Status: Abnormal   Collection Time: 10/12/17  2:10 PM  Result Value Ref Range   WBC 2.8 (L) 4.0 - 10.5 K/uL   RBC 2.16 (L) 3.87 - 5.11 MIL/uL   Hemoglobin 7.0 (L) 12.0 - 15.0 g/dL   HCT 21.8 (L) 36.0 - 46.0 %   MCV 100.9 (H) 78.0 - 100.0 fL   MCH 32.4 26.0 - 34.0 pg   MCHC 32.1 30.0 - 36.0 g/dL   RDW 19.6 (H) 11.5 - 15.5 %   Platelets 155 150 - 400 K/uL    Comment: Performed at [facility] [address] [place], [place] [address]  Urinalysis, Routine w reflex microscopic     Status: Abnormal   Collection Time: 10/13/17  8:02 AM  Result Value Ref Range   Color, Urine YELLOW YELLOW   APPearance HAZY (A) CLEAR   Specific Gravity, Urine 1.013 1.005 - 1.030   pH 5.0 5.0 - 8.0   Glucose, UA NEGATIVE NEGATIVE mg/dL   Hgb urine dipstick NEGATIVE NEGATIVE   Bilirubin Urine NEGATIVE NEGATIVE   Ketones, ur 20 (A) NEGATIVE mg/dL   Protein, ur NEGATIVE NEGATIVE mg/dL   Nitrite POSITIVE (A)  NEGATIVE   Leukocytes, UA TRACE (A) NEGATIVE   RBC / HPF 0-5 0 - 5 RBC/hpf   WBC, UA 21-50 0 - 5 WBC/hpf   Bacteria, UA MANY (A) NONE SEEN   Squamous Epithelial / LPF 0-5 0 - 5   WBC Clumps PRESENT    Mucus PRESENT     Comment: Performed at [facility], [place] [address] [place], [place] [address]  Urine Culture     Status: Abnormal   Collection Time: 10/13/17  8:53 AM  Result Value Ref Range   Specimen Description URINE, CATHETERIZED    Special Requests      NONE Performed at [facility] [address] [place], [place] [address]    Culture >=100,000 COLONIES/mL ESCHERICHIA COLI (A)    Report Status 10/15/2017 FINAL    Organism ID, Bacteria ESCHERICHIA COLI (A)       Susceptibility   Escherichia coli - MIC*    AMPICILLIN >=32 RESISTANT  Resistant     CEFAZOLIN >=64 RESISTANT Resistant     CEFTRIAXONE 8 SENSITIVE Sensitive     CIPROFLOXACIN <=0.25 SENSITIVE Sensitive     GENTAMICIN >=16 RESISTANT Resistant     IMIPENEM <=0.25 SENSITIVE Sensitive     NITROFURANTOIN 64 INTERMEDIATE Intermediate     TRIMETH/SULFA <=20 SENSITIVE Sensitive     AMPICILLIN/SULBACTAM >=32 RESISTANT Resistant     PIP/TAZO <=4 SENSITIVE Sensitive     Extended ESBL NEGATIVE Sensitive     * >=100,000 COLONIES/mL ESCHERICHIA COLI   *Note: Due to a large number of results and/or encounters for the requested time period, some results have not been displayed. A complete set of results can be found in Results Review.     Constitutional: No distress . Vital signs reviewed. obese HEENT: EOMI, oral membranes moist Neck: supple Cardiovascular: RRR without murmur. No JVD    Respiratory: CTA Bilaterally without wheezes or rales. Normal effort    GI: BS +, non-tender, non-distended  Neuro: Alert/Oriented person place month day and year not date, Abnormal Sensory absent motor and sensory below T2 and Abnormal Motor 5/5 BUE, 0/5 BLE, except trace knee ext Bilaterally Musc/Skel:  Other no pain with UE AROM, no pain with LE PROM Psych: pleasant Skin: surgical wound healing and intact   Assessment/Plan: 1. Functional deficits secondary to T3 paraplegia which require 3+ hours per day of interdisciplinary therapy in a comprehensive inpatient rehab setting. Physiatrist is providing close team supervision and 24 hour management of active medical problems listed below. Physiatrist and rehab team continue to assess barriers to discharge/monitor patient progress toward functional and medical goals. FIM: Function - Bathing Position: Bed Body parts bathed by patient: Right arm, Left arm, Chest, Abdomen, Right upper leg, Left upper leg, Right lower leg, Left lower leg Body parts bathed by helper: Front perineal area, Buttocks Bathing not applicable: Front perineal  area, Buttocks Assist Level: Touching or steadying assistance(Pt > 75%) Assistive Device Comment: LH sponge and reacher Set up : To obtain items  Function- Upper Body Dressing/Undressing What is the patient wearing?: Pull over shirt/dress Bra - Perfomed by patient: Thread/unthread right bra strap, Thread/unthread left bra strap, Hook/unhook bra (pull down sports bra) Bra - Perfomed by helper: Hook/unhook bra (pull down sports bra) Pull over shirt/dress - Perfomed by patient: Thread/unthread right sleeve, Thread/unthread left sleeve, Put head through opening, Pull shirt over trunk Pull over shirt/dress - Perfomed by helper: Pull shirt over trunk Assist Level: Set up, Supervision or verbal cues Set  up : To obtain clothing/put away Function - Lower Body Dressing/Undressing What is the patient wearing?: Pants, Ted Hose Position: Bed Pants- Performed by patient: Thread/unthread right pants leg, Thread/unthread left pants leg, Pull pants up/down Pants- Performed by helper: Pull pants up/down Shoes - Performed by helper: Don/doff right shoe, Don/doff left shoe, Fasten right, Fasten left TED Hose - Performed by helper: Don/doff right TED hose, Don/doff left TED hose Assist for footwear: Supervision/touching assist Assist for lower body dressing: Supervision or verbal cues, Set up  Function - Toileting Toileting activity did not occur: No continent bowel/bladder event Toileting steps completed by patient: Adjust clothing prior to toileting Toileting steps completed by helper: Performs perineal hygiene, Adjust clothing after toileting Assist level: Two helpers  Function - Air cabin crew transfer activity did not occur: N/A Assist level to toilet: 2 helpers(per Wyvonnia Lora, NT) Assist level from toilet: 2 helpers  Function - Chair/bed transfer Chair/bed transfer activity did not occur: N/A Chair/bed transfer method: Lateral scoot Chair/bed transfer assist level: Total assist (Pt <  25%) Chair/bed transfer assistive device: Sliding board, Armrests Chair/bed transfer details: Manual facilitation for placement, Manual facilitation for weight shifting, Verbal cues for technique, Tactile cues for sequencing, Tactile cues for weight shifting  Function - Locomotion: Wheelchair Will patient use wheelchair at discharge?: Yes Type: Manual Max wheelchair distance: 150' Assist Level: No help, No cues, assistive device, takes more than reasonable amount of time Wheel 50 feet with 2 turns activity did not occur: Safety/medical concerns Assist Level: No help, No cues, assistive device, takes more than reasonable amount of time Wheel 150 feet activity did not occur: Safety/medical concerns Assist Level: No help, No cues, assistive device, takes more than reasonable amount of time Turns around,maneuvers to table,bed, and toilet,negotiates 3% grade,maneuvers on rugs and over doorsills: Yes Function - Locomotion: Ambulation Ambulation activity did not occur: Safety/medical concerns Walk 10 feet activity did not occur: Safety/medical concerns Walk 50 feet with 2 turns activity did not occur: Safety/medical concerns Walk 150 feet activity did not occur: Safety/medical concerns Walk 10 feet on uneven surfaces activity did not occur: Safety/medical concerns  Function - Comprehension Comprehension: Auditory Comprehension assist level: Understands complex 90% of the time/cues 10% of the time  Function - Expression Expression: Verbal Expression assist level: Expresses basic needs/ideas: With extra time/assistive device  Function - Social Interaction Social Interaction assist level: Interacts appropriately 90% of the time - Needs monitoring or encouragement for participation or interaction.  Function - Problem Solving Problem solving assist level: Solves complex 90% of the time/cues < 10% of the time  Function - Memory Memory assist level: Complete Independence: No helper Patient  normally able to recall (first 3 days only): Current season, Location of own room, Staff names and faces, That he or she is in a hospital   Medical Problem List and Plan: 1.  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 Cont CIR, PT,  OT.    Team feels pt had setback , bowel issues and weakness yesterday, expect pt to bounce back fairly quickly , unlikely to require 6 additional days to regain baseline status    2.  DVT Prophylaxis/Anticoagulation/history of pulmonary emboli: Xarelto.  Monitor for any bleeding episodes 3. Pain Management/spasticity:   -pt with lethargy and nausea reduce baclofen to 12m TID and 261mqhs- baclofen 67m72mID, pt fears mental status changes with higher dose although she tolerated 27m28mD  and 40m Qhs -continue tramadol for more severe pain  -continue tylenol prn 4. Mood: Zoloft 25 mg daily.  Provide emotional support 5. Neuropsych: This patient is capable of making decisions on her own behalf. 6. Skin/Wound Care: Routine skin checks, Left knee incision well healed 7. Fluids/Electrolytes/Nutrition: check labs tomorrow 8.  Left femoral shaft fracture.  Status post intramedullary retrograde nailing 09/11/2017.  Nonweightbearing.may be WBAAT per ortho 9.  Acute blood loss anemia.  Follow-up hgb 7.8 on 7/16---repeat CBC hgb 7.0 if further drop rec transfusion 10.  Hyperglycemia secondary to steroid.  improved  -dc'ed cbg checks and SSI 11.  Neurogenic bowel/ bladder.    - PM bowel program with some improvement  -increased ditropan to TID  -100k GNR e coli int sens to nitrofurantoin, will d/c switch to bactrim 12.  Hypothyroidism.  Synthroid 13. Thrombocytopenia - PLT up to 104 K on 7/20, actually has pancytopenia, had normal WBC ~1 mo ago, recheck normal 155K 14.  Sinus symptoms, flonase, added ocean nasal spray , add claritin 15.  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

## 2017-10-16 ENCOUNTER — Inpatient Hospital Stay (HOSPITAL_COMMUNITY): Payer: Medicare Other | Admitting: Physical Therapy

## 2017-10-16 ENCOUNTER — Inpatient Hospital Stay (HOSPITAL_COMMUNITY): Payer: Medicare Other

## 2017-10-16 ENCOUNTER — Inpatient Hospital Stay (HOSPITAL_COMMUNITY): Payer: Medicare Other | Admitting: Occupational Therapy

## 2017-10-16 MED ORDER — BACLOFEN 10 MG PO TABS
10.0000 mg | ORAL_TABLET | Freq: Four times a day (QID) | ORAL | Status: DC
  Administered 2017-10-16 – 2017-10-19 (×13): 10 mg via ORAL
  Filled 2017-10-16 (×12): qty 1

## 2017-10-16 MED ORDER — SENNOSIDES-DOCUSATE SODIUM 8.6-50 MG PO TABS
2.0000 | ORAL_TABLET | Freq: Two times a day (BID) | ORAL | Status: DC
  Administered 2017-10-16 – 2017-10-21 (×9): 2 via ORAL
  Filled 2017-10-16 (×14): qty 2

## 2017-10-16 NOTE — Progress Notes (Signed)
Physical Therapy Session Note  Patient Details  Name: Sarah [name] MRN: [id number] Date of Birth: 1966/01/16  Today's Date: 10/16/2017 PT Individual Time: 1315-1330 PT Individual Time Calculation (min): 15 min  and Today's Date: 10/16/2017 PT Missed Time: 15 Minutes Missed Time Reason: Nursing care(catheterization)  Short Term Goals: Week 3:  PT Short Term Goal 1 (Week 3): LTG=STG due to ELOS  Skilled Therapeutic Interventions/Progress Updates:    Pt with nursing upon PT arrival, missed 15 minutes this session secondary to catheterization. Pt transferred from supine>sitting EOB with mod assist, verbal cues for techniques. Pt performed slideboard transfer from bed>w/c with max assist, verbal cues for techniques and manual facilitation for weightshifting. Pt left seated in w/c at end of session with needs in reach.   Therapy Documentation Precautions:  Precautions Precautions: Fall Precaution Comments: throracic region, LEs spasicity  Restrictions Weight Bearing Restrictions: Yes LLE Weight Bearing: Touchdown weight bearing Other Position/Activity Restrictions: LLE NWB   See Function Navigator for Current Functional Status.   Therapy/Group: Individual Therapy  Netta Corrigan, PT, DPT 10/16/2017, 7:58 AM

## 2017-10-16 NOTE — Progress Notes (Signed)
Occupational Therapy Session Note  Patient Details  Name: Sarah [name] MRN: [id number] Date of Birth: 1966-01-09  Today's Date: 10/16/2017 OT Individual Time: 1405-1500 OT Individual Time Calculation (min): 55 min   Short Term Goals: Week 3:  OT Short Term Goal 1 (Week 3): STGs = LTGs  Skilled Therapeutic Interventions/Progress Updates:    Pt greeted in w/c and requesting to go outdoors. She self propelled to outdoor setting with increased time. Then we worked on [employer] with leg loops for increasing functional independence with LB self care. Blocked practice doffing/donning sneakers utilizing figure 4 position. Pt requiring Mod A to achieve figure 4 with max exertion from pt. Also worked on pressure relief and UB strengthening via small w/c push ups.  At end of session pt was escorted in w/c back to unit and left via PT handoff in hallway.   Therapy Documentation Precautions:  Precautions Precautions: Fall Precaution Comments: throracic region, LEs spasicity  Restrictions Weight Bearing Restrictions: Yes LLE Weight Bearing: Touchdown weight bearing Other Position/Activity Restrictions: LLE NWB Pain: No c/o pain during session    ADL: ADL ADL Comments: see functional navigator     See Function Navigator for Current Functional Status.   Therapy/Group: Individual Therapy  Roselind Klus A Nely Dedmon 10/16/2017, 4:05 PM

## 2017-10-16 NOTE — Progress Notes (Signed)
Physical Therapy Session Note  Patient Details  Name: Sarah [name] MRN: [id number] Date of Birth: 1965-10-19  Today's Date: 10/16/2017 PT Individual Time: 0830-1000 PT Individual Time Calculation (min): 90 min   Short Term Goals: Week 3:  PT Short Term Goal 1 (Week 3): LTG=STG due to ELOS  Skilled Therapeutic Interventions/Progress Updates:    Pt received supine in bed, agreeable to PT. No complaints of pain but pt exhibits increased tone in BLE. Rolling L/R with min A and use of bedrail for brief change and pericare (slightly soiled brief) and to don pants. Supine to sit with min A with HOB elevated and use of bedrail. Sliding board transfer bed to w/c with max A, 4" step under BLE for support. Max A to reposition in chair. Seated ADLs at sink at w/c level with setup assist. Manual w/c propulsion throughout unit with BUE Mod I. Beezy board transfer w/c to/from mat table x 4 going slightly downhill, max A fading to mod A with each transfer. Pt has increased anxiety with [facility] board transfer due to fear of falling but exhibits decreased assist needed with transfer. UBE x 4 min fwd/backward level 4 for UE strengthening. Pt left seated in w/c in room with towel roll behind sacrum to decrease pressure, needs in reach.  Therapy Documentation Precautions:  Precautions Precautions: Fall Precaution Comments: throracic region, LEs spasicity  Restrictions Weight Bearing Restrictions: Yes LLE Weight Bearing: Touchdown weight bearing Other Position/Activity Restrictions: LLE NWB  See Function Navigator for Current Functional Status.   Therapy/Group: Individual Therapy  Excell Seltzer, PT, DPT  10/16/2017, 12:04 PM

## 2017-10-16 NOTE — Progress Notes (Signed)
Physical Therapy Weekly Progress Note  Patient Details  Name: Sarah [name] MRN: [id number] Date of Birth: 1965-06-19  Beginning of progress report period: October 09, 2017 End of progress report period: October 16, 2017  Today's Date: 10/16/2017 PT Individual Time: 1500-1600 PT Individual Time Calculation (min): 60 min   Patient has met 5 of 7 long term goals. Pt experienced functional set back due to AMS and illness due to medication adjustment and lapse in bowel program. Pt currently requires max A to plus 2 assist for all transfers with slide board. She demonstrates increased UE weakness and decreased trunk control. Pt has progressed to min A with performing bed mobility using leg loops to achieve supine<>sitting in bed. Therapist will continue to implement slide board transfers for both even and uneven surfaces and UE strengthening into treatment sessions to further progress Pt towards her LTG's.  Patient continues to demonstrate the following deficits muscle weakness, muscle joint tightness and muscle paralysis, abnormal tone, decreased coordination and decreased motor planning and decreased sitting balance, decreased postural control and decreased balance strategies and therefore will continue to benefit from skilled PT intervention to increase functional independence with mobility.  Patient progressing toward long term goals..  Continue plan of care.  PT Short Term Goals Week 3:  PT Short Term Goal 1 (Week 3): LTG=STG due to ELOS PT Short Term Goal 1 - Progress (Week 3): Progressing toward goal Week 4:  PT Short Term Goal 1 (Week 4): LTG=STG due to ELOS  Skilled Therapeutic Interventions/Progress Updates:    Pt reported no pain prior to session onset. Pt propelled w/c using B UE's 150 feet Mod I. Pt verbalized and demonstrated correct sequencing for transferring from w/c<>mat using beeasy board with max A plus 1 with 4 inch block under LE's. Board placement with beeasy requires 2 person  assist prior to transfer initiation. Pt able to sit on edge of treatment mat and perform forward trunk flexion with functional reaching using B UE's hitting targets with supervision. Pt performed UE strengthening sitting edge of mat including 3x15 reps with 5# wt bicep curls, tricep extension with 3# wt, seated rows with orange t-band and therapist providing manual resistance, and shoulder ER with orange t band. Pt performed transfer from mat>w/c using beeasy board and max A. Pt required mod A to posteriorly weight shift for posture once in chair. Pt propelled w/c back to room Mod I and remained sitting with tray table and call bell in reach and all needs met.   Therapy Documentation Precautions:  Precautions Precautions: Fall Precaution Comments: throracic region, LEs spasicity  Restrictions Weight Bearing Restrictions: Yes LLE Weight Bearing: Touchdown weight bearing Other Position/Activity Restrictions: LLE NWB   See Function Navigator for Current Functional Status.  Therapy/Group: Individual Therapy  Floreen Comber 10/16/2017, 4:04 PM

## 2017-10-16 NOTE — Progress Notes (Signed)
Subjective/Complaints:  No further mental status issues.  She is afraid to take more than 10 mg of baclofen.  She is getting spasms that keep her up at night however. ROS: Patient denies  nausea, vomiting, diarrhea, cough, shortness of breath or chest pain, joint or back pain,  . .    Objective: Vital Signs: Blood pressure 130/81, pulse 80, temperature 98.6 F (37 C), temperature source Oral, resp. rate 15, height 5\' 11"  (1.803 m), weight 103.2 kg (227 lb 9.8 oz), last menstrual period 10/22/2016, SpO2 100 %. No results found. Results for orders placed or performed during the hospital encounter of 09/21/17 (from the past 72 hour(s))  Urine Culture     Status: Abnormal   Collection Time: 10/13/17  8:53 AM  Result Value Ref Range   Specimen Description URINE, CATHETERIZED    Special Requests      NONE Performed at [facility], [address] [place], [place] [address]    Culture >=100,000 COLONIES/mL ESCHERICHIA COLI (A)    Report Status 10/15/2017 FINAL    Organism ID, Bacteria ESCHERICHIA COLI (A)       Susceptibility   Escherichia coli - MIC*    AMPICILLIN >=32 RESISTANT Resistant     CEFAZOLIN >=64 RESISTANT Resistant     CEFTRIAXONE 8 SENSITIVE Sensitive     CIPROFLOXACIN <=0.25 SENSITIVE Sensitive     GENTAMICIN >=16 RESISTANT Resistant     IMIPENEM <=0.25 SENSITIVE Sensitive     NITROFURANTOIN 64 INTERMEDIATE Intermediate     TRIMETH/SULFA <=20 SENSITIVE Sensitive     AMPICILLIN/SULBACTAM >=32 RESISTANT Resistant     PIP/TAZO <=4 SENSITIVE Sensitive     Extended ESBL NEGATIVE Sensitive     * >=100,000 COLONIES/mL ESCHERICHIA COLI   *Note: Due to a large number of results and/or encounters for the requested time period, some results have not been displayed. A complete set of results can be found in Results Review.     Constitutional: No distress . Vital signs reviewed. obese HEENT: EOMI, oral membranes moist Neck: supple Cardiovascular: RRR without murmur.  No JVD    Respiratory: CTA Bilaterally without wheezes or rales. Normal effort    GI: BS +, non-tender, non-distended  Neuro: Alert/Oriented person place month day and year not date, Abnormal Sensory absent motor and sensory below T2 and Abnormal Motor 5/5 BUE, 0/5 BLE, except trace knee ext Bilaterally Musc/Skel:  Other no pain with UE AROM, no pain with LE PROM Psych: pleasant Skin: surgical wound healing and intact   Assessment/Plan: 1. Functional deficits secondary to T3 paraplegia which require 3+ hours per day of interdisciplinary therapy in a comprehensive inpatient rehab setting. Physiatrist is providing close team supervision and 24 hour management of active medical problems listed below. Physiatrist and rehab team continue to assess barriers to discharge/monitor patient progress toward functional and medical goals. FIM: Function - Bathing Position: Bed Body parts bathed by patient: Right arm, Left arm, Chest, Abdomen, Right upper leg, Left upper leg, Right lower leg, Left lower leg Body parts bathed by helper: Front perineal area, Buttocks Bathing not applicable: Front perineal area, Buttocks Assist Level: Touching or steadying assistance(Pt > 75%) Assistive Device Comment: LH sponge and reacher Set up : To obtain items  Function- Upper Body Dressing/Undressing What is the patient wearing?: Pull over shirt/dress Bra - Perfomed by patient: Thread/unthread right bra strap, Thread/unthread left bra strap, Hook/unhook bra (pull down sports bra) Bra - Perfomed by helper: Hook/unhook bra (pull down sports bra) Pull  over shirt/dress - Perfomed by patient: Thread/unthread right sleeve, Thread/unthread left sleeve, Put head through opening, Pull shirt over trunk Pull over shirt/dress - Perfomed by helper: Pull shirt over trunk Assist Level: Set up, Supervision or verbal cues Set up : To obtain clothing/put away Function - Lower Body Dressing/Undressing What is the patient wearing?:  Pants, Ted Hose Position: Bed Pants- Performed by patient: Thread/unthread right pants leg, Thread/unthread left pants leg, Pull pants up/down Pants- Performed by helper: Pull pants up/down Shoes - Performed by helper: Don/doff right shoe, Don/doff left shoe, Fasten right, Fasten left TED Hose - Performed by helper: Don/doff right TED hose, Don/doff left TED hose Assist for footwear: Supervision/touching assist Assist for lower body dressing: Supervision or verbal cues, Set up  Function - Toileting Toileting activity did not occur: No continent bowel/bladder event Toileting steps completed by patient: Adjust clothing prior to toileting Toileting steps completed by helper: Performs perineal hygiene, Adjust clothing after toileting Assist level: Two helpers  Function - Air cabin crew transfer activity did not occur: N/A Assist level to toilet: 2 helpers(per Wyvonnia Lora, NT) Assist level from toilet: 2 helpers  Function - Chair/bed transfer Chair/bed transfer activity did not occur: N/A Chair/bed transfer method: Lateral scoot Chair/bed transfer assist level: Total assist (Pt < 25%) Chair/bed transfer assistive device: Sliding board, Armrests Chair/bed transfer details: Manual facilitation for placement, Manual facilitation for weight shifting, Verbal cues for technique, Tactile cues for sequencing, Tactile cues for weight shifting  Function - Locomotion: Wheelchair Will patient use wheelchair at discharge?: Yes Type: Manual Max wheelchair distance: 150' Assist Level: No help, No cues, assistive device, takes more than reasonable amount of time Wheel 50 feet with 2 turns activity did not occur: Safety/medical concerns Assist Level: No help, No cues, assistive device, takes more than reasonable amount of time Wheel 150 feet activity did not occur: Safety/medical concerns Assist Level: No help, No cues, assistive device, takes more than reasonable amount of time Turns  around,maneuvers to table,bed, and toilet,negotiates 3% grade,maneuvers on rugs and over doorsills: Yes Function - Locomotion: Ambulation Ambulation activity did not occur: Safety/medical concerns Walk 10 feet activity did not occur: Safety/medical concerns Walk 50 feet with 2 turns activity did not occur: Safety/medical concerns Walk 150 feet activity did not occur: Safety/medical concerns Walk 10 feet on uneven surfaces activity did not occur: Safety/medical concerns  Function - Comprehension Comprehension: Auditory Comprehension assist level: Understands complex 90% of the time/cues 10% of the time  Function - Expression Expression: Verbal Expression assist level: Expresses basic needs/ideas: With extra time/assistive device  Function - Social Interaction Social Interaction assist level: Interacts appropriately 90% of the time - Needs monitoring or encouragement for participation or interaction.  Function - Problem Solving Problem solving assist level: Solves complex 90% of the time/cues < 10% of the time  Function - Memory Memory assist level: Complete Independence: No helper Patient normally able to recall (first 3 days only): Current season, Location of own room, Staff names and faces, That he or she is in a hospital   Medical Problem List and Plan: 1.  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 Cont CIR, PT,  OT.    Patient lost a couple days of therapy with mental status changes due to baclofen    2.  DVT Prophylaxis/Anticoagulation/history of pulmonary emboli: Xarelto.  Monitor for any bleeding episodes 3. Pain Management/spasticity:   -pt with lethargy and  nausea reduce baclofen to 10mg  QID  -continue tramadol for more severe pain  -continue tylenol prn 4. Mood: Zoloft 25 mg daily.  Provide emotional support 5. Neuropsych: This patient is capable of making decisions  on her own behalf. 6. Skin/Wound Care: Routine skin checks, Left knee incision well healed 7. Fluids/Electrolytes/Nutrition: check labs tomorrow 8.  Left femoral shaft fracture.  Status post intramedullary retrograde nailing 09/11/2017.  .may be WBAT per ortho 9.  Acute blood loss anemia.  Follow-up hgb 7.8 on 7/16---repeat CBC hgb 7.0 if further drop rec transfusion 10.  Hyperglycemia secondary to steroid.  improved  -dc'ed cbg checks and SSI 11.  Neurogenic bowel/ bladder.    - PM bowel program with some improvement  -increased ditropan to TID  -100k GNR   bactrim DS through 10/20/2017 12.  Hypothyroidism.  Synthroid 13. Thrombocytopenia - PLT up to 104 K on 7/20, actually has pancytopenia, had normal WBC ~1 mo ago, recheck normal 155K 14.  Sinus symptoms, flonase, added ocean nasal spray , add claritin 15.  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

## 2017-10-17 ENCOUNTER — Inpatient Hospital Stay (HOSPITAL_COMMUNITY): Payer: Medicare Other | Admitting: Occupational Therapy

## 2017-10-17 DIAGNOSIS — N390 Urinary tract infection, site not specified: Secondary | ICD-10-CM

## 2017-10-17 DIAGNOSIS — D62 Acute posthemorrhagic anemia: Secondary | ICD-10-CM

## 2017-10-17 DIAGNOSIS — T148XXA Other injury of unspecified body region, initial encounter: Secondary | ICD-10-CM

## 2017-10-17 NOTE — Plan of Care (Signed)
  Problem: Consults Goal: RH SPINAL CORD INJURY PATIENT EDUCATION Description  See Patient Education module for education specifics.  Outcome: Progressing Goal: Skin Care Protocol Initiated - if Braden Score 18 or less Description If consults are not indicated, leave blank or document N/A Outcome: Progressing Goal: Nutrition Consult-if indicated Outcome: Progressing   Problem: RH SKIN INTEGRITY Goal: RH STG SKIN FREE OF INFECTION/BREAKDOWN Description Skin wound healing and no new skin breakdown with min assistance  Outcome: Progressing Goal: RH STG MAINTAIN SKIN INTEGRITY WITH ASSISTANCE Description STG Maintain Skin Integrity With min Assistance.  Outcome: Progressing Goal: RH STG ABLE TO PERFORM INCISION/WOUND CARE W/ASSISTANCE Description STG Able To Perform Incision/Wound Care With mod Assistance.  Outcome: Progressing   Problem: RH SAFETY Goal: RH STG ADHERE TO SAFETY PRECAUTIONS W/ASSISTANCE/DEVICE Description STG Adhere to Safety Precautions With min Assistance/Device.  Outcome: Progressing   Problem: RH PAIN MANAGEMENT Goal: RH STG PAIN MANAGED AT OR BELOW PT'S PAIN GOAL Description Pain < or = 4 with min assistance  Outcome: Progressing   Problem: RH KNOWLEDGE DEFICIT SCI Goal: RH STG INCREASE KNOWLEDGE OF SELF CARE AFTER SCI Description Patient able to verbalize safe care activities with min assistance   Outcome: Progressing   Problem: SCI BOWEL ELIMINATION Goal: RH STG MANAGE BOWEL WITH ASSISTANCE Description STG Manage Bowel with mod Assistance.  Outcome: Not Progressing Goal: RH STG SCI MANAGE BOWEL WITH MEDICATION WITH ASSISTANCE Description STG SCI Manage bowel with medication with mod assistance.  Outcome: Not Progressing Goal: RH STG SCI MANAGE BOWEL PROGRAM W/ASSIST OR AS APPROPRIATE Description STG SCI Manage bowel program w/assist mod   Outcome: Not Progressing   Problem: SCI BLADDER ELIMINATION Goal: RH STG MANAGE BLADDER WITH  ASSISTANCE Description STG Manage Bladder With mod Assistance  Outcome: Not Progressing

## 2017-10-17 NOTE — Progress Notes (Addendum)
Occupational Therapy Session Note  Patient Details  Name: Sarah [name] MRN: [id number] Date of Birth: February 22, 1966  Today's Date: 10/17/2017 OT Individual Time: 9179-1505 OT Individual Time Calculation (min): 44 min   Short Term Goals: Week 3:  OT Short Term Goal 1 (Week 3): STGs = LTGs  Skilled Therapeutic Interventions/Progress Updates:    Pt greeted in w/c. Premedicated for pain. Worked on functional toilet transfers using slideboard to bariatric drop arm commode. Pt required Max A to [facility] and 2 helpers for transfer back to w/c. She required cues for head/hip relationship, hand placement, and deep breathing (for anxiousness). Used the step for LE support and she assisted therapist with LE mgt using leg loops. At end of session pt was left in w/c with all needs within reach.   Therapy Documentation Precautions:  Precautions Precautions: Fall Precaution Comments: throracic region, LEs spasicity  Restrictions Weight Bearing Restrictions: Yes LLE Weight Bearing: Touchdown weight bearing Other Position/Activity Restrictions: B LEs(Order updated 10/14/17) Vital Signs: Therapy Vitals Temp: 98.2 F (36.8 C) Temp Source: Oral Pulse Rate: 83 Resp: 20 BP: 125/83 Patient Position (if appropriate): Sitting Oxygen Therapy SpO2: 100 % O2 Device: Room Air Pain: Pain Assessment Pain Scale: 0-10 Pain Score: 6  Pain Type: Acute pain Pain Location: Back Pain Orientation: Medial Pain Descriptors / Indicators: Aching Pain Onset: Gradual Pain Intervention(s): Medication (See eMAR) ADL: ADL ADL Comments: see functional navigator     See Function Navigator for Current Functional Status.   Therapy/Group: Individual Therapy  Anika Shore A Byren Pankow 10/17/2017, 4:00 PM

## 2017-10-17 NOTE — Progress Notes (Signed)
Subjective/Complaints: Patient seen sitting up in bed this morning. She states she slept well overnight.She asks me to check her mouth for thrush. Discussed with nursing, patient refusing PRAFO.   ROS: Denies nausea, vomiting, diarrhea, shortness of breath, chest pain  Objective: Vital Signs: Blood pressure 118/74, pulse 80, temperature 98.5 F (36.9 C), temperature source Oral, resp. rate 17, height 5\' 11"  (1.803 m), weight 103.2 kg (227 lb 9.8 oz), last menstrual period 10/22/2016, SpO2 100 %. No results found. No results found. However, due to the size of the patient record, not all encounters were searched. Please check Results Review for a complete set of results.   Constitutional: No distress . Vital signs reviewed. obese HENT: Normocephalic. Atraumatic. No thrush Eyes: EOMI. No discharge. Cardiovascular: RRR. No JVD    Respiratory: CTA Bilaterally. Normal effort    GI: BS +, non-distended  Neuro: Alert/Oriented  Motor: bilateral upper extremity 5/5 proximal distal Bilateral lower extremities involuntary movements noted in toes, 0/5 on MMT Musc/Skel:  No edema or tenderness in extremities Psych: slightly anxious Skin: warm and dry. Intact.   Assessment/Plan: 1. Functional deficits secondary to T3 paraplegia which require 3+ hours per day of interdisciplinary therapy in a comprehensive inpatient rehab setting. Physiatrist is providing close team supervision and 24 hour management of active medical problems listed below. Physiatrist and rehab team continue to assess barriers to discharge/monitor patient progress toward functional and medical goals. FIM: Function - Bathing Position: Bed Body parts bathed by patient: Right arm, Left arm, Chest, Abdomen, Right upper leg, Left upper leg, Right lower leg, Left lower leg Body parts bathed by helper: Front perineal area, Buttocks Bathing not applicable: Front perineal area, Buttocks Assist Level: Touching or steadying  assistance(Pt > 75%) Assistive Device Comment: LH sponge and reacher Set up : To obtain items  Function- Upper Body Dressing/Undressing What is the patient wearing?: Pull over shirt/dress Bra - Perfomed by patient: Thread/unthread right bra strap, Thread/unthread left bra strap, Hook/unhook bra (pull down sports bra) Bra - Perfomed by helper: Hook/unhook bra (pull down sports bra) Pull over shirt/dress - Perfomed by patient: Thread/unthread right sleeve, Thread/unthread left sleeve, Put head through opening, Pull shirt over trunk Pull over shirt/dress - Perfomed by helper: Pull shirt over trunk Assist Level: Set up, Supervision or verbal cues Set up : To obtain clothing/put away Function - Lower Body Dressing/Undressing What is the patient wearing?: Pants, Ted Hose Position: Bed Pants- Performed by patient: Thread/unthread right pants leg, Thread/unthread left pants leg, Pull pants up/down Pants- Performed by helper: Pull pants up/down Shoes - Performed by helper: Don/doff right shoe, Don/doff left shoe, Fasten right, Fasten left TED Hose - Performed by helper: Don/doff right TED hose, Don/doff left TED hose Assist for footwear: Supervision/touching assist Assist for lower body dressing: Supervision or verbal cues, Set up  Function - Toileting Toileting activity did not occur: No continent bowel/bladder event Toileting steps completed by patient: Adjust clothing prior to toileting Toileting steps completed by helper: Performs perineal hygiene, Adjust clothing after toileting Assist level: Two helpers  Function - Air cabin crew transfer activity did not occur: N/A Assist level to toilet: 2 helpers(per Wyvonnia Lora, NT) Assist level from toilet: 2 helpers  Function - Chair/bed transfer Chair/bed transfer activity did not occur: N/A Chair/bed transfer method: Lateral scoot Chair/bed transfer assist level: Maximal assist (Pt 25 - 49%/lift and lower) Chair/bed transfer  assistive device: Armrests(beeasy board) Chair/bed transfer details: Manual facilitation for placement, Manual facilitation for weight shifting, Verbal cues  for technique, Tactile cues for sequencing, Tactile cues for weight shifting, Verbal cues for safe use of DME/AE, Verbal cues for sequencing  Function - Locomotion: Wheelchair Will patient use wheelchair at discharge?: Yes Type: Manual Max wheelchair distance: 150' Assist Level: No help, No cues, assistive device, takes more than reasonable amount of time Wheel 50 feet with 2 turns activity did not occur: Safety/medical concerns Assist Level: No help, No cues, assistive device, takes more than reasonable amount of time Wheel 150 feet activity did not occur: Safety/medical concerns Assist Level: No help, No cues, assistive device, takes more than reasonable amount of time Turns around,maneuvers to table,bed, and toilet,negotiates 3% grade,maneuvers on rugs and over doorsills: Yes Function - Locomotion: Ambulation Ambulation activity did not occur: Safety/medical concerns Walk 10 feet activity did not occur: Safety/medical concerns Walk 50 feet with 2 turns activity did not occur: Safety/medical concerns Walk 150 feet activity did not occur: Safety/medical concerns Walk 10 feet on uneven surfaces activity did not occur: Safety/medical concerns  Function - Comprehension Comprehension: Auditory Comprehension assist level: Understands complex 90% of the time/cues 10% of the time  Function - Expression Expression: Verbal Expression assist level: Expresses basic needs/ideas: With extra time/assistive device  Function - Social Interaction Social Interaction assist level: Interacts appropriately 90% of the time - Needs monitoring or encouragement for participation or interaction.  Function - Problem Solving Problem solving assist level: Solves complex 90% of the time/cues < 10% of the time  Function - Memory Memory assist level:  Complete Independence: No helper Patient normally able to recall (first 3 days only): Current season, Location of own room, Staff names and faces, That he or she is in a hospital   Medical Problem List and Plan: 1.  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  Cont CIR, PT,  OT.    Patient lost a couple days of therapy with mental status changes due to baclofen 2.  DVT Prophylaxis/Anticoagulation/history of pulmonary emboli: Xarelto.  Monitor for any bleeding episodes 3. Pain Management/spasticity:   -pt with lethargy and nausea reduce baclofen to 10mg  QID  -continue tramadol for more severe pain  -continue tylenol prn 4. Mood: Zoloft 25 mg daily.  Provide emotional support 5. Neuropsych: This patient is capable of making decisions on her own behalf. 6. Skin/Wound Care: Routine skin checks, Left knee incision well healed 7. Fluids/Electrolytes/Nutrition:   BMP within acceptable range on 10/22 8.  Left femoral shaft fracture.  Status post intramedullary retrograde nailing 09/11/2017.  WBAT per ortho 9.  Acute blood loss anemia.    Hemoglobin 7.0 on 7/22  Labs ordered for Monday 10.  Hyperglycemia secondary to steroid.  improved  -dc'ed cbg checks and SSI 11.  Neurogenic bowel/ bladder.    - PM bowel program with some improvement  -increased ditropan to TID  -100k GNR   bactrim DS through 10/20/2017  12.  Hypothyroidism.  Synthroid 13. Thrombocytopenia -Reasolved 14.  Sinus symptoms, flonase, added ocean nasal spray , add claritin 15.  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

## 2017-10-17 NOTE — Progress Notes (Signed)
Abrasion/skin tear noted to left inner labia during cath at about 1730. Area cleansed and dried. Continue to monitor.

## 2017-10-17 NOTE — Progress Notes (Signed)
Occupational Therapy Session Note  Patient Details  Name: Sarah [name] MRN: [id number] Date of Birth: [birth year]  Today's Date: 10/17/2017 OT Group Time: 1100-1200 OT Group Time Calculation (min): 60 min   Skilled Therapeutic Interventions/Progress Updates:    Pt participated in therapeutic w/c level dance group with focus on UE/LE strengthening, activity tolerance, and social participation for carryover during self care tasks. Pt was guided through various dance-based exercises involving UB/LB and trunk. Emphasis placed on LE mgt using leg loops. Pt actively participated throughout session, requested songs, and conversed with other group members. Bilateral UE involvement required to move each leg. At end of session pt self propelled back to room.   Therapy Documentation Precautions:  Precautions Precautions: Fall Precaution Comments: throracic region, LEs spasicity  Restrictions Weight Bearing Restrictions: Yes LLE Weight Bearing: Touchdown weight bearing Other Position/Activity Restrictions: B LEs(Order updated 10/14/17) Pain: No c/o pain during session    ADL: ADL ADL Comments: see functional navigator     See Function Navigator for Current Functional Status.   Therapy/Group: Group Therapy  Hiroko Tregre A Marquel Pottenger 10/17/2017, 12:53 PM

## 2017-10-17 NOTE — Progress Notes (Signed)
Patient suppository given at 1740. At [place] patient placed up in a sitting position with maxi move over bedpan and dig stim performed. Patient had medium-large amount of soft semi--formed stool. Peri care done and patient repositioned in bed. All belongings within reach. Continue with plan of care.

## 2017-10-18 ENCOUNTER — Inpatient Hospital Stay (HOSPITAL_COMMUNITY): Payer: Medicare Other

## 2017-10-18 MED ORDER — CIPROFLOXACIN HCL 250 MG PO TABS
250.0000 mg | ORAL_TABLET | Freq: Two times a day (BID) | ORAL | Status: DC
  Administered 2017-10-18 – 2017-10-20 (×4): 250 mg via ORAL
  Filled 2017-10-18 (×5): qty 1

## 2017-10-18 NOTE — Progress Notes (Signed)
Physical Therapy Session Note  Patient Details  Name: Sarah [name] MRN: [id number] Date of Birth: [birth year]  Today's Date: 10/18/2017 PT Individual Time: 1400-1500 PT Individual Time Calculation (min): 60 min   Short Term Goals:  Week 4:  PT Short Term Goal 1 (Week 4): LTG=STG due to ELOS  Skilled Therapeutic Interventions/Progress Updates:    Attempted transfer to Nustep with goal to address reciprocal movement pattern re-training, decreasing tone, and increasing functional ROM, but pt becoming anxious once transferred onto seat and requested to return back to w/c today. Requires +2 assist for slideboard transfer w/c <> nustep with cues for head/hips relationship and hand placement and utilized block under feet. Pt agreeable to attempt again on another day now that she did half the transfer. Focused on simulated car transfer in preparation for discharge and educated on differences in simulated car vs real car and techniques/options. Again utilized step under feet to make sure point of contact exists and performed slideboard transfer in/out of car with max +2 assist and total assist for management of BLE in and out of car. Tone in BLE limiting ease with LE management for in/out of car. PT adjusted w/c brakes for better locking and safety. Engaged in seated stretching of BLE at hips, knees, and ankles for decreased tone and increased available ROM to aid with mobility. D/c planning discussed throughout session.   Therapy Documentation Precautions:  Precautions Precautions: Fall Precaution Comments: throracic region, LEs spasicity  Restrictions Weight Bearing Restrictions: No; BLEs WBAT(Order updated 10/14/17)  Pain:  Denies pain.   See Function Navigator for Current Functional Status.   Therapy/Group: Individual Therapy  Lars Masson, PT, DPT  10/18/2017, 3:46 PM

## 2017-10-18 NOTE — Progress Notes (Signed)
Subjective/Complaints: Patient seen sitting up in bed this morning. Her discharge date was extended. She states she slept well overnight. Discussed lab work with nursing. Discussed bladder meds with patient, patient states that she was able to void this morning.   ROS: denies nausea, vomiting, diarrhea, shortness of breath, chest pain  Objective: Vital Signs: Blood pressure 108/71, pulse 72, temperature 98 F (36.7 C), temperature source Oral, resp. rate 16, height 5\' 11"  (1.803 m), weight 103.2 kg (227 lb 9.8 oz), last menstrual period 10/22/2016, SpO2 100 %. No results found. No results found. However, due to the size of the patient record, not all encounters were searched. Please check Results Review for a complete set of results.   Constitutional: No distress . Vital signs reviewed. obese HENT: Normocephalic. Atraumatic. No thrush Eyes: EOMI. No discharge. Cardiovascular: RRR. No JVD    Respiratory: CTA bilaterally. Normal effort    GI: BS +, non-distended  Neuro: alert and oriented  Motor: bilateral upper extremity 5/5 proximal distal Bilateral lower extremities 0/5 proximal to distal Musc/Skel:  No edema or tenderness in extremities Psych: slightly anxious Skin: warm and dry. Intact.   Assessment/Plan: 1. Functional deficits secondary to T3 paraplegia which require 3+ hours per day of interdisciplinary therapy in a comprehensive inpatient rehab setting. Physiatrist is providing close team supervision and 24 hour management of active medical problems listed below. Physiatrist and rehab team continue to assess barriers to discharge/monitor patient progress toward functional and medical goals. FIM: Function - Bathing Position: Bed Body parts bathed by patient: Right arm, Left arm, Chest, Abdomen, Right upper leg, Left upper leg, Right lower leg, Left lower leg Body parts bathed by helper: Front perineal area, Buttocks Bathing not applicable: Front perineal area,  Buttocks Assist Level: Touching or steadying assistance(Pt > 75%) Assistive Device Comment: LH sponge and reacher Set up : To obtain items  Function- Upper Body Dressing/Undressing What is the patient wearing?: Pull over shirt/dress Bra - Perfomed by patient: Thread/unthread right bra strap, Thread/unthread left bra strap, Hook/unhook bra (pull down sports bra) Bra - Perfomed by helper: Hook/unhook bra (pull down sports bra) Pull over shirt/dress - Perfomed by patient: Thread/unthread right sleeve, Thread/unthread left sleeve, Put head through opening, Pull shirt over trunk Pull over shirt/dress - Perfomed by helper: Pull shirt over trunk Assist Level: Set up, Supervision or verbal cues Set up : To obtain clothing/put away Function - Lower Body Dressing/Undressing What is the patient wearing?: Pants, Ted Hose Position: Bed Pants- Performed by patient: Thread/unthread right pants leg, Thread/unthread left pants leg, Pull pants up/down Pants- Performed by helper: Pull pants up/down Shoes - Performed by helper: Don/doff right shoe, Don/doff left shoe, Fasten right, Fasten left TED Hose - Performed by helper: Don/doff right TED hose, Don/doff left TED hose Assist for footwear: Supervision/touching assist Assist for lower body dressing: Supervision or verbal cues, Set up  Function - Toileting Toileting activity did not occur: No continent bowel/bladder event Toileting steps completed by patient: Adjust clothing prior to toileting, Performs perineal hygiene, Adjust clothing after toileting Toileting steps completed by helper: Performs perineal hygiene, Adjust clothing after toileting Assist level: Two helpers  Function - Air cabin crew transfer activity did not occur: N/A Toilet transfer assistive device: Bedside commode Assist level to toilet: 2 helpers(per Wyvonnia Lora, NT) Assist level from toilet: 2 helpers Assist level to bedside commode (at bedside): Maximal assist (Pt 25 -  49%/lift and lower) Assist level from bedside commode (at bedside): 2 helpers  Function -  Chair/bed transfer Chair/bed transfer activity did not occur: N/A Chair/bed transfer method: Lateral scoot Chair/bed transfer assist level: Maximal assist (Pt 25 - 49%/lift and lower) Chair/bed transfer assistive device: Armrests(beeasy board) Chair/bed transfer details: Manual facilitation for placement, Manual facilitation for weight shifting, Verbal cues for technique, Tactile cues for sequencing, Tactile cues for weight shifting, Verbal cues for safe use of DME/AE, Verbal cues for sequencing  Function - Locomotion: Wheelchair Will patient use wheelchair at discharge?: Yes Type: Manual Max wheelchair distance: 150' Assist Level: No help, No cues, assistive device, takes more than reasonable amount of time Wheel 50 feet with 2 turns activity did not occur: Safety/medical concerns Assist Level: No help, No cues, assistive device, takes more than reasonable amount of time Wheel 150 feet activity did not occur: Safety/medical concerns Assist Level: No help, No cues, assistive device, takes more than reasonable amount of time Turns around,maneuvers to table,bed, and toilet,negotiates 3% grade,maneuvers on rugs and over doorsills: Yes Function - Locomotion: Ambulation Ambulation activity did not occur: Safety/medical concerns Walk 10 feet activity did not occur: Safety/medical concerns Walk 50 feet with 2 turns activity did not occur: Safety/medical concerns Walk 150 feet activity did not occur: Safety/medical concerns Walk 10 feet on uneven surfaces activity did not occur: Safety/medical concerns  Function - Comprehension Comprehension: Auditory Comprehension assist level: Follows complex conversation/direction with no assist  Function - Expression Expression: Verbal Expression assist level: Expresses basic 90% of the time/requires cueing < 10% of the time.  Function - Social Interaction Social  Interaction assist level: Interacts appropriately 90% of the time - Needs monitoring or encouragement for participation or interaction.  Function - Problem Solving Problem solving assist level: Solves basic 90% of the time/requires cueing < 10% of the time  Function - Memory Memory assist level: Complete Independence: No helper Patient normally able to recall (first 3 days only): Current season, Location of own room, Staff names and faces, That he or she is in a hospital   Medical Problem List and Plan: 1.  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  Cont CIR  Patient lost a couple days of therapy with mental status changes due to baclofen previously 2.  DVT Prophylaxis/Anticoagulation/history of pulmonary emboli: Xarelto.  Monitor for any bleeding episodes 3. Pain Management/spasticity:   -pt with lethargy and nausea reduce baclofen to 10mg  QID  -continue tramadol for more severe pain  -continue tylenol prn 4. Mood: Zoloft 25 mg daily.  Provide emotional support 5. Neuropsych: This patient is capable of making decisions on her own behalf. 6. Skin/Wound Care: Routine skin checks, Left knee incision well healed 7. Fluids/Electrolytes/Nutrition:   BMP within acceptable range on 10/22 8.  Left femoral shaft fracture.  Status post intramedullary retrograde nailing 09/11/2017.  WBAT per ortho 9.  Acute blood loss anemia.    Hemoglobin 7.0 on 7/22  Labs ordered for tomorrow 10.  Hyperglycemia secondary to steroid.  improved  -dc'ed cbg checks and SSI 11.  Neurogenic bowel/ bladder.    - PM bowel program with some improvement  -increased ditropan to TID  -100k GNR   bactrim DS through 7/30  Discussed continuing current medications as patient has started to spontaneously void 12.  Hypothyroidism.  Synthroid 13. Thrombocytopenia -Reasolved 14.  Sinus symptoms, flonase, added ocean nasal  spray , add claritin 15.  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

## 2017-10-19 ENCOUNTER — Inpatient Hospital Stay (HOSPITAL_COMMUNITY): Payer: Medicare Other

## 2017-10-19 ENCOUNTER — Inpatient Hospital Stay (HOSPITAL_COMMUNITY): Payer: Medicare Other | Admitting: Physical Therapy

## 2017-10-19 ENCOUNTER — Inpatient Hospital Stay (HOSPITAL_COMMUNITY): Payer: Medicare Other | Admitting: Occupational Therapy

## 2017-10-19 LAB — CBC
HCT: 24.2 % — ABNORMAL LOW (ref 36.0–46.0)
Hemoglobin: 7.4 g/dL — ABNORMAL LOW (ref 12.0–15.0)
MCH: 32.3 pg (ref 26.0–34.0)
MCHC: 30.6 g/dL (ref 30.0–36.0)
MCV: 105.7 fL — ABNORMAL HIGH (ref 78.0–100.0)
PLATELETS: 176 10*3/uL (ref 150–400)
RBC: 2.29 MIL/uL — ABNORMAL LOW (ref 3.87–5.11)
RDW: 19.4 % — AB (ref 11.5–15.5)
WBC: 3 10*3/uL — AB (ref 4.0–10.5)

## 2017-10-19 MED ORDER — BACLOFEN 10 MG PO TABS
10.0000 mg | ORAL_TABLET | Freq: Three times a day (TID) | ORAL | Status: DC
  Administered 2017-10-19 – 2017-10-24 (×15): 10 mg via ORAL
  Filled 2017-10-19 (×15): qty 1

## 2017-10-19 NOTE — Progress Notes (Signed)
Occupational Therapy Session Note  Patient Details  Name: Sarah [name] MRN: [id number] Date of Birth: November 14, 1965  Today's Date: 10/19/2017 OT Individual Time: 1350-1430 OT Individual Time Calculation (min): 40 min   Short Term Goals: Week 3:  OT Short Term Goal 1 (Week 3): STGs = LTGs  Skilled Therapeutic Interventions/Progress Updates:    Pt greeted in w/c with ADL needs met. Requesting to go off unit to buy food. Worked on [employer], IADL participation, and w/c mobility in community setting. Pt navigating around environmental barriers and positioning w/c at cash registers (in gift shop and [place]) for payment. During seated rest, discussed wearing loose dresses vs tight pants once Foley catheter is placed for increased ease of LB dressing. Pt verbalized that lately, she's felt more "foggy" cognitive-wise and exhibits concerns regarding d/c. Provided emotional support and encouragement at this time. At end of session pt self propelled back to room and was left with all needs within reach.   Therapy Documentation Precautions:  Precautions Precautions: Fall Precaution Comments: throracic region, LEs spasicity  Restrictions Weight Bearing Restrictions: Yes LLE Weight Bearing: Touchdown weight bearing Other Position/Activity Restrictions: B LEs(Order updated 10/14/17) Vital Signs: Therapy Vitals Pulse Rate: 78 Resp: 18 BP: 107/65 Patient Position (if appropriate): Sitting Oxygen Therapy SpO2: 100 % O2 Device: Room Air Pain: In back. RN notified at end of session, per pt request, to administer pain medication   ADL: ADL ADL Comments: see functional navigator :    See Function Navigator for Current Functional Status.   Therapy/Group: Individual Therapy  Sarah [name] 10/19/2017, 4:04 PM

## 2017-10-19 NOTE — Progress Notes (Signed)
Occupational Therapy Session Note  Patient Details  Name: Sarah [name] MRN: [id number] Date of Birth: 06-Oct-1965  Today's Date: 10/19/2017 OT Individual Time: 6160-7371 OT Individual Time Calculation (min): 75 min    Short Term Goals: Week 4:  OT Short Term Goal 1 (Week 4): STGs=LTGs secondary to upcoming discharge  Skilled Therapeutic Interventions/Progress Updates:    Upon entering the room, pt supine in bed and requesting to wash and change clothing items. B TED hose donned prior to OT arrival. Pt performed UB self care with set up A to wash self and change clothing. Pt utilized reacher to thread pants onto B LEs. Pt exhibiting increased effort and fatigue pulling pants on and declined flat bed for rolling secondary to feeling short of breath. O2 WNL on room air. Pt rolling with min A to L <> R and pt pull pants over B hips. OT assisted pt with donning B shoes and leg loops. Pt performed supine >sit with min A for trunk and pt utilizing leg loops with increased time to pull LEs from bed. Slide board transfer with total A from bed >wheelchair towards the R. Pt seated in wheelchair at sink for grooming tasks at mod I level. Pt voiced concerns regarding goals and discharge home. OT recommending hoyer lift for home use for safety with transfers with family and pt agreeable. Pt states, " I'm not sure my family can or should be doing all of this." OT discussed possibility of short term SNF rehab and notified social worker of pt's interest. Pt remained seated in wheelchair at end of session with call bell and all needed items within reach upon exiting the room.   Therapy Documentation Precautions:  Precautions Precautions: Fall Precaution Comments: throracic region, LEs spasicity  Restrictions Weight Bearing Restrictions: Yes LLE Weight Bearing: Touchdown weight bearing Other Position/Activity Restrictions: B LEs(Order updated 10/14/17) Pain: Pain Assessment Pain Scale: 0-10 Pain Score: 3   Faces Pain Scale: Hurts a little bit Pain Type: Chronic pain Pain Descriptors / Indicators: Headache;Aching Pain Onset: Gradual Patients Stated Pain Goal: 1 Pain Intervention(s): Medication (See eMAR);Prayer;Massage;Emotional support;Repositioned;Back rub Multiple Pain Sites: No PAINAD (Pain Assessment in Advanced Dementia) Breathing: normal Negative Vocalization: none Body Language: relaxed Consolability: no need to console Critical Care Pain Observation Tool (CPOT) Facial Expression: Relaxed, neutral ADL: ADL ADL Comments: see functional navigator  See Function Navigator for Current Functional Status.   Therapy/Group: Individual Therapy  [name] 10/19/2017, 12:33 PM

## 2017-10-19 NOTE — Progress Notes (Signed)
Physical Therapy Session Note  Patient Details  Name: Sarah [name] MRN: [id number] Date of Birth: 02/08/1966  Today's Date: 10/20/2017 PT Individual Time: 1500-1600= 60 min     Short Term Goals: Week 4:  PT Short Term Goal 1 (Week 4): LTG=STG due to ELOS  Skilled Therapeutic Interventions/Progress Updates:    Pt reports no pain prior to treatment session onset. Supine> sitting EOB with HOB elevated, min A with leg loops for LE management. Pt believes Ted's may be contributing to abdominal discomfort and PT removed Ted's and Pt remained asymptomatic throughout treatment session duration. Pt performed lateral scoot with slide board and mod A for boosting from EOB>w/c. Min A for posterior weight shift for adequate posturing in chair. Pt propelled w/c from room<>treatment gym for 300 feet. Pt BP assessed to be 118/80. Pt performed lateral scoot with slide board on incline for uneven transfer practice requiring max A for lateral boost. PT applied kinesiotape in sitting to L UE forearm over bruise to assist with circulation promotion for healing. Pt was educated regarding safety and contraindications due to heat activation and to remove taping if irritation occurs. Pt performed slide board transfer from mat> w/c with mod A on the decline for boosting. Pt propelled w/c back to room and was returned sitting with all needs met. Pt reports she is open to going to SNF for further therapy before going home.   Therapy Documentation Precautions:  Precautions Precautions: Fall Precaution Comments: throracic region, LEs spasicity  Restrictions Weight Bearing Restrictions: Yes LLE Weight Bearing: Touchdown weight bearing Other Position/Activity Restrictions: B LEs(Order updated 10/14/17)   See Function Navigator for Current Functional Status.   Therapy/Group: Individual Therapy  Floreen Comber 10/20/2017, 8:56 AM

## 2017-10-19 NOTE — Progress Notes (Signed)
Patient refuses to take her Bactrim she said it was making her sick in her stomach, on-call notified and it was changed to Cipro PO. We continue to monitor.

## 2017-10-19 NOTE — Progress Notes (Signed)
Bladder scan=524cc's, I & O cath=500cc's. Leaking urine prior to cath. Large stool at 2150. Patrici Ranks A

## 2017-10-19 NOTE — Progress Notes (Signed)
Subjective/Complaints: No new issues this morning  ROS: Patient denies fever, rash, sore throat, blurred vision, nausea, vomiting, diarrhea, cough, shortness of breath or chest pain, joint or back pain, headache, or mood change.   Objective: Vital Signs: Blood pressure 107/65, pulse 78, temperature 98.1 F (36.7 C), temperature source Oral, resp. rate 18, height 5\' 11"  (1.803 m), weight 103.2 kg (227 lb 9.8 oz), last menstrual period 10/22/2016, SpO2 100 %. No results found. Results for orders placed or performed during the hospital encounter of 09/21/17 (from the past 72 hour(s))  CBC     Status: Abnormal   Collection Time: 10/19/17  5:31 AM  Result Value Ref Range   WBC 3.0 (L) 4.0 - 10.5 K/uL   RBC 2.29 (L) 3.87 - 5.11 MIL/uL   Hemoglobin 7.4 (L) 12.0 - 15.0 g/dL   HCT 24.2 (L) 36.0 - 46.0 %   MCV 105.7 (H) 78.0 - 100.0 fL   MCH 32.3 26.0 - 34.0 pg   MCHC 30.6 30.0 - 36.0 g/dL   RDW 19.4 (H) 11.5 - 15.5 %   Platelets 176 150 - 400 K/uL    Comment: Performed at [facility], [place] [address] [place], [place] [address]   *Note: Due to a large number of results and/or encounters for the requested time period, some results have not been displayed. A complete set of results can be found in Results Review.     Constitutional: No distress . Vital signs reviewed. HEENT: EOMI, oral membranes moist Neck: supple Cardiovascular: RRR without murmur. No JVD    Respiratory: CTA Bilaterally without wheezes or rales. Normal effort    GI: BS +, non-tender, non-distended  Neuro: alert and oriented  Motor: bilateral upper extremity 5/5 proximal distal Bilateral lower extremities 0/5 proximal to distal Musc/Skel:  No edema or tenderness in extremities Psych:pleasant Skin: warm and dry. Intact.   Assessment/Plan: 1. Functional deficits secondary to T3 paraplegia which require 3+ hours per day of interdisciplinary therapy in a comprehensive inpatient rehab setting. Physiatrist is  providing close team supervision and 24 hour management of active medical problems listed below. Physiatrist and rehab team continue to assess barriers to discharge/monitor patient progress toward functional and medical goals. FIM: Function - Bathing Position: Bed Body parts bathed by patient: Right arm, Left arm, Chest, Abdomen(Ub only) Body parts bathed by helper: Back Bathing not applicable: Front perineal area, Buttocks Assist Level: Supervision or verbal cues, Set up Assistive Device Comment: LH sponge and reacher Set up : To obtain items  Function- Upper Body Dressing/Undressing What is the patient wearing?: Pull over shirt/dress Bra - Perfomed by patient: Thread/unthread right bra strap, Thread/unthread left bra strap, Hook/unhook bra (pull down sports bra) Bra - Perfomed by helper: Hook/unhook bra (pull down sports bra) Pull over shirt/dress - Perfomed by patient: Thread/unthread right sleeve, Thread/unthread left sleeve, Put head through opening, Pull shirt over trunk Pull over shirt/dress - Perfomed by helper: Pull shirt over trunk Assist Level: Set up, Supervision or verbal cues Set up : To obtain clothing/put away Function - Lower Body Dressing/Undressing What is the patient wearing?: Pants, Maryln Manuel, Shoes Position: Bed Pants- Performed by patient: Thread/unthread right pants leg, Thread/unthread left pants leg, Pull pants up/down Pants- Performed by helper: Pull pants up/down Shoes - Performed by helper: Don/doff right shoe, Don/doff left shoe, Fasten right, Fasten left TED Hose - Performed by helper: Don/doff right TED hose, Don/doff left TED hose Assist for footwear: Dependant Assist for lower body dressing:  Touching or steadying assistance (Pt > 75%)  Function - Toileting Toileting activity did not occur: No continent bowel/bladder event Toileting steps completed by patient: Adjust clothing prior to toileting, Performs perineal hygiene, Adjust clothing after  toileting Toileting steps completed by helper: Adjust clothing prior to toileting, Performs perineal hygiene, Adjust clothing after toileting Assist level: Two helpers  Function - Air cabin crew transfer activity did not occur: N/A Toilet transfer assistive device: Bedside commode Assist level to toilet: 2 helpers(per Wyvonnia Lora, NT) Assist level from toilet: 2 helpers Assist level to bedside commode (at bedside): Maximal assist (Pt 25 - 49%/lift and lower) Assist level from bedside commode (at bedside): 2 helpers  Function - Chair/bed transfer Chair/bed transfer activity did not occur: N/A Chair/bed transfer method: Lateral scoot Chair/bed transfer assist level: Maximal assist (Pt 25 - 49%/lift and lower) Chair/bed transfer assistive device: Armrests Chair/bed transfer details: Manual facilitation for placement, Manual facilitation for weight shifting, Verbal cues for technique, Tactile cues for sequencing, Tactile cues for weight shifting, Verbal cues for safe use of DME/AE, Verbal cues for sequencing  Function - Locomotion: Wheelchair Will patient use wheelchair at discharge?: Yes Type: Manual Max wheelchair distance: 300 Assist Level: No help, No cues, assistive device, takes more than reasonable amount of time Wheel 50 feet with 2 turns activity did not occur: Safety/medical concerns Assist Level: No help, No cues, assistive device, takes more than reasonable amount of time Wheel 150 feet activity did not occur: Safety/medical concerns Assist Level: No help, No cues, assistive device, takes more than reasonable amount of time Turns around,maneuvers to table,bed, and toilet,negotiates 3% grade,maneuvers on rugs and over doorsills: Yes Function - Locomotion: Ambulation Ambulation activity did not occur: Safety/medical concerns Walk 10 feet activity did not occur: Safety/medical concerns Walk 50 feet with 2 turns activity did not occur: Safety/medical concerns Walk 150  feet activity did not occur: Safety/medical concerns Walk 10 feet on uneven surfaces activity did not occur: Safety/medical concerns  Function - Comprehension Comprehension: Auditory Comprehension assist level: Follows complex conversation/direction with no assist  Function - Expression Expression: Verbal Expression assist level: Expresses complex ideas: With no assist  Function - Social Interaction Social Interaction assist level: Interacts appropriately with others - No medications needed.  Function - Problem Solving Problem solving assist level: Solves complex problems: Recognizes & self-corrects  Function - Memory Memory assist level: Complete Independence: No helper Patient normally able to recall (first 3 days only): Current season, Location of own room, Staff names and faces, That he or she is in a hospital   Medical Problem List and Plan: 1.  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  Cont CIR  Patient lost a couple days of therapy with mental status changes due to baclofen previously 2.  DVT Prophylaxis/Anticoagulation/history of pulmonary emboli: Xarelto.  Monitor for any bleeding episodes 3. Pain Management/spasticity:   -pt with lethargy and nausea better with reduction of baclofen to 10mg  QID  -continue tramadol for more severe pain  -continue tylenol prn 4. Mood: Zoloft 25 mg daily.  Provide emotional support 5. Neuropsych: This patient is capable of making decisions on her own behalf. 6. Skin/Wound Care: Routine skin checks, Left knee incision well healed 7. Fluids/Electrolytes/Nutrition:   BMP within acceptable range on 10/22 8.  Left femoral shaft fracture.  Status post intramedullary retrograde nailing 09/11/2017.  WBAT per ortho 9.  Acute blood loss anemia.  Hemoglobin 7.4 on 7/29  Labs ordered for tomorrow 10.  Hyperglycemia secondary to steroid.   improved  -dc'ed cbg checks and SSI 11.  Neurogenic bowel/ bladder.    - PM bowel program with some improvement  -discuss ditropan adjustment with patient  -100k GNR   bactrim DS through 7/30    12.  Hypothyroidism.  Synthroid 13. Thrombocytopenia -Resolved 14.  Sinus symptoms, flonase, added ocean nasal spray , add claritin 15.  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

## 2017-10-19 NOTE — Progress Notes (Signed)
Physical Therapy Session Note  Patient Details  Name: Sarah [name] MRN: [id number] Date of Birth: [birth year]  Today's Date: 10/19/2017 PT Individual Time: 4098-1191, 1630-1700 PT Individual Time Calculation (min): 45 min and 30 min (60 minutes of make up time)  Short Term Goals: Week 4:  PT Short Term Goal 1 (Week 4): LTG=STG due to ELOS  Skilled Therapeutic Interventions/Progress Updates:    Session 1: Pt supine in bed upon PT arrival, agreeable to therapy tx and denies pain. Pt reports that she feels like she is not ready to leave by the end of the week and states she feels like she is back where she started. Therapist provided emotional support and discussed different transfer options at home slideboard vs hoyer lift. Therapist assisted pt to don teds, pt pulls up over thigh. Pt reports her legs feel very stiff. Session focused on LE stretching for ROM and tone management. Therapist performed manual hamstring stretch, heel cord stretch, hip extensor stretch and hip adductor stretch in supine bilaterally, 2 x 1 min each. Pt transferred to sidelying with bedrails and min assist, therapist performed manual hip flexor stretch bilaterally, x 1 minutes. Pt transferred back to supine and left with needs in reach, bed alarm set.   Session 2: Pt seated in w/c upon PT arrival, agreeable to therapy tx and denies pain. Pt does state she feels exhausted from practicing transfers in previous session, requests to work on UEs. Pt propelled w/c from room>dayroom with supervision x 100 ft. Pt used UE bike x 5 minutes on workload 4 for strengthening and endurance. Pt seated in w/c therapist performed LE stretches bilaterall for hamstrings, hip IRs and hip adductors. Pt worked on w/c propulsion and endurance, propelled x 500 ft throughout unit with supervision. Pt left in w/c in room at end of session with needs in reach.    Therapy Documentation Precautions:  Precautions Precautions: Fall Precaution  Comments: throracic region, LEs spasicity  Restrictions Weight Bearing Restrictions: Yes LLE Weight Bearing: Touchdown weight bearing Other Position/Activity Restrictions: B LEs(Order updated 10/14/17)   See Function Navigator for Current Functional Status.   Therapy/Group: Individual Therapy  Netta Corrigan, PT, DPT 10/19/2017, 7:41 AM

## 2017-10-20 ENCOUNTER — Inpatient Hospital Stay (HOSPITAL_COMMUNITY): Payer: Medicare Other | Admitting: Physical Therapy

## 2017-10-20 ENCOUNTER — Inpatient Hospital Stay (HOSPITAL_COMMUNITY): Payer: Medicare Other | Admitting: Occupational Therapy

## 2017-10-20 LAB — GLUCOSE, CAPILLARY: Glucose-Capillary: 87 mg/dL (ref 70–99)

## 2017-10-20 MED ORDER — TIZANIDINE HCL 2 MG PO TABS
2.0000 mg | ORAL_TABLET | Freq: Two times a day (BID) | ORAL | Status: DC
  Administered 2017-10-20 – 2017-10-22 (×5): 2 mg via ORAL
  Filled 2017-10-20 (×5): qty 1

## 2017-10-20 MED ORDER — CIPROFLOXACIN HCL 250 MG PO TABS
250.0000 mg | ORAL_TABLET | Freq: Two times a day (BID) | ORAL | Status: AC
  Administered 2017-10-20: 250 mg via ORAL
  Filled 2017-10-20: qty 1

## 2017-10-20 NOTE — Progress Notes (Signed)
Occupational Therapy Session Note  Patient Details  Name: Sarah [name] MRN: [id number] Date of Birth: March 27, 1965  Today's Date: 10/20/2017 OT Individual Time: 2595-6387 OT Individual Time Calculation (min): 70 min    Short Term Goals: Week 4:  OT Short Term Goal 1 (Week 4): STGs=LTGs secondary to upcoming discharge  Skilled Therapeutic Interventions/Progress Updates:    Upon entering the room, pt supine in bed and agreeable to OT intervention. Pt requests to wash and change clothing. Pt performed UB bathing and dressing from bed level with set up A to obtain all needed items. Pt utilized LH sponge to wash B LEs and feet from bed level as well. Pt reports feeling like she had BM. Pt rolling L <> R with min A and use of bed rails. Pt requiring total A for hygiene from bed level. Pt utilized reacher to thread pants onto B feet and up legs with min A to roll L <> R. Pt able to pull pants over B hips. Assistance to don B shoes and leg loops. Pt utilized leg loops to slide LEs from bed and min A for trunk to come into sitting on EOB. Pt placing slide board herself and needed max A for slide board from bed >wheelchair. Pt seated in wheelchair at sink for grooming tasks at mod I level. Call bell and all needed items within reach upon exiting the room.   Therapy Documentation Precautions:  Precautions Precautions: Fall Precaution Comments: throracic region, LEs spasicity  Restrictions Weight Bearing Restrictions: Yes LLE Weight Bearing: Touchdown weight bearing Other Position/Activity Restrictions: B LEs(Order updated 10/14/17) Pain: Pain Assessment Pain Scale: 0-10 Pain Score: 2  Faces Pain Scale: Hurts a little bit Pain Type: Chronic pain Pain Location: Back Pain Orientation: Lower Pain Descriptors / Indicators: Aching Pain Onset: On-going Patients Stated Pain Goal: 1 Pain Intervention(s): Prayer;Massage;Emotional support;Repositioned;Cold applied Multiple Pain Sites: No PAINAD (Pain  Assessment in Advanced Dementia) Breathing: normal Facial Expression: smiling or inexpressive Body Language: relaxed Consolability: no need to console Critical Care Pain Observation Tool (CPOT) Facial Expression: Relaxed, neutral ADL: ADL ADL Comments: see functional navigator  See Function Navigator for Current Functional Status.   Therapy/Group: Individual Therapy  [name] 10/20/2017, 11:11 AM

## 2017-10-20 NOTE — Progress Notes (Signed)
Physical Therapy Session Note  Patient Details  Name: Sarah [name] MRN: [id number] Date of Birth: 11/03/1965  Today's Date: 10/20/2017 PT Individual Time: 0900-1000 & 2330-0762 PT Individual Time Calculation (min): 60 min & 59 min   Short Term Goals: Week 4:  PT Short Term Goal 1 (Week 4): LTG=STG due to ELOS  Skilled Therapeutic Interventions/Progress Updates:    Session 1: Pt reports no pain prior to session. Pt propels w/c using B UE's from room<> treatment gym mod I for 300 ft. Pt able to manage w/c parts and align w/c in preparation for lateral scoot transfer with slide board mod I. Pt requires min A to align board prior to transfer initiation and requires mod A for slide board transfer for lateral boost and 4 inch block for LE's. Pt performs sitting balance activity with functional reaching to retrieve cards and place them to designated spot with therapist providing min guard. This activity incorporates forced wt bearing through UE's to reach and retrieve cards placed on either side of her for NMR. Pt performed ball tapping working on dynamic sitting balance for 3 trials of 2 min using B UE's reaching in all planes with therapist providing CGA. Pt performed lateral scoot transfer on the decline for inc efficiency with slide board from mat>w/c with mod A for lateral boost. Pt required min A for posterior wt shift into chair for proper posture. Pt propelled w/c back to room Mod I and was returned sitting with tray table and call bell in reach and all needs met.   Session 2: Pt reports no pain just increased fatigue prior to session start. Supine>sitting EOB min A for LE management with leg loops and HOB elevated. Pt performs lateral scoot transfer with slide board and mod A for boost. Mod A to posteriorly scoot in chair for proper positioning. Therapist attempted to place towel roll for sacral pressure reduction; however, Pt declined indicating it was uncomfortable and positioned her too far  forward. Pt propelled w/c from room<> gym for 300 ft mod I. Pt performed UE strengthening in w/c for 3x10 reps of each including bicep curls with 7# wt, shoulder flexion with 3# wt, and 4# weighted ball toss back and forth with therapist. Therapist applied kinesiotape to L UE forearm for bruise reduction/ healing. Pt was educated regarding contraindications and to remove tape if skin irritation occurs. Pt propelled w/c back to room and was returned sitting in chair with all needs met.  Therapy Documentation Precautions:  Precautions Precautions: Fall Precaution Comments: throracic region, LEs spasicity  Restrictions Weight Bearing Restrictions: Yes LLE Weight Bearing: Touchdown weight bearing Other Position/Activity Restrictions: B LEs(Order updated 10/14/17)   See Function Navigator for Current Functional Status.   Therapy/Group: Individual Therapy  Floreen Comber 10/20/2017, 4:32 PM

## 2017-10-20 NOTE — Progress Notes (Signed)
Subjective/Complaints: Concerned that she might have an abdominal hernia. Belly feels weak, distended at times. Finds it hard to take a deep breath. Having intermittent retention/incontinence  ROS: Patient denies fever, rash, sore throat, blurred vision, nausea, vomiting, diarrhea, cough, shortness of breath or chest pain, joint or back pain, headache, or mood change.   Objective: Vital Signs: Blood pressure 103/63, pulse 74, temperature 97.7 F (36.5 C), temperature source Oral, resp. rate 18, height 5\' 11"  (1.803 m), weight 103.2 kg (227 lb 9.8 oz), last menstrual period 10/22/2016, SpO2 99 %. No results found. Results for orders placed or performed during the hospital encounter of 09/21/17 (from the past 72 hour(s))  CBC     Status: Abnormal   Collection Time: 10/19/17  5:31 AM  Result Value Ref Range   WBC 3.0 (L) 4.0 - 10.5 K/uL   RBC 2.29 (L) 3.87 - 5.11 MIL/uL   Hemoglobin 7.4 (L) 12.0 - 15.0 g/dL   HCT 24.2 (L) 36.0 - 46.0 %   MCV 105.7 (H) 78.0 - 100.0 fL   MCH 32.3 26.0 - 34.0 pg   MCHC 30.6 30.0 - 36.0 g/dL   RDW 19.4 (H) 11.5 - 15.5 %   Platelets 176 150 - 400 K/uL    Comment: Performed at [facility], [place] [address] [place], [place] [address]   *Note: Due to a large number of results and/or encounters for the requested time period, some results have not been displayed. A complete set of results can be found in Results Review.     Constitutional: No distress . Vital signs reviewed. HEENT: EOMI, oral membranes moist Neck: supple Cardiovascular: RRR without murmur. No JVD    Respiratory: CTA Bilaterally without wheezes or rales. Normal effort    GI: BS +, non-tender, non-distended. No hernia.  Neuro: alert and oriented  Motor: bilateral upper extremity 5/5 proximal distal Bilateral lower extremities 0/5 proximal to distal, sensory level at lower sternum Musc/Skel:  No edema or tenderness in extremities Psych:pleasant Skin: warm and dry.  Intact.   Assessment/Plan: 1. Functional deficits secondary to T3 paraplegia which require 3+ hours per day of interdisciplinary therapy in a comprehensive inpatient rehab setting. Physiatrist is providing close team supervision and 24 hour management of active medical problems listed below. Physiatrist and rehab team continue to assess barriers to discharge/monitor patient progress toward functional and medical goals. FIM: Function - Bathing Position: Bed Body parts bathed by patient: Right arm, Left arm, Chest, Abdomen(Ub only) Body parts bathed by helper: Back Bathing not applicable: Front perineal area, Buttocks Assist Level: Supervision or verbal cues, Set up Assistive Device Comment: LH sponge and reacher Set up : To obtain items  Function- Upper Body Dressing/Undressing What is the patient wearing?: Pull over shirt/dress Bra - Perfomed by patient: Thread/unthread right bra strap, Thread/unthread left bra strap, Hook/unhook bra (pull down sports bra) Bra - Perfomed by helper: Hook/unhook bra (pull down sports bra) Pull over shirt/dress - Perfomed by patient: Thread/unthread right sleeve, Thread/unthread left sleeve, Put head through opening, Pull shirt over trunk Pull over shirt/dress - Perfomed by helper: Pull shirt over trunk Assist Level: Set up, Supervision or verbal cues Set up : To obtain clothing/put away Function - Lower Body Dressing/Undressing What is the patient wearing?: Pants, Ted Hose, Shoes Position: Bed Pants- Performed by patient: Thread/unthread right pants leg, Thread/unthread left pants leg, Pull pants up/down Pants- Performed by helper: Pull pants up/down Shoes - Performed by helper: Don/doff right shoe, Don/doff left shoe,  Fasten right, Fasten left TED Hose - Performed by helper: Don/doff right TED hose, Don/doff left TED hose Assist for footwear: Dependant Assist for lower body dressing: Touching or steadying assistance (Pt > 75%)  Function -  Toileting Toileting activity did not occur: No continent bowel/bladder event Toileting steps completed by patient: Adjust clothing prior to toileting, Performs perineal hygiene, Adjust clothing after toileting Toileting steps completed by helper: Adjust clothing prior to toileting, Performs perineal hygiene, Adjust clothing after toileting Assist level: Two helpers  Function - Air cabin crew transfer activity did not occur: N/A Toilet transfer assistive device: Bedside commode Assist level to toilet: 2 helpers(per Wyvonnia Lora, NT) Assist level from toilet: 2 helpers Assist level to bedside commode (at bedside): Maximal assist (Pt 25 - 49%/lift and lower) Assist level from bedside commode (at bedside): 2 helpers  Function - Chair/bed transfer Chair/bed transfer activity did not occur: N/A Chair/bed transfer method: Lateral scoot Chair/bed transfer assist level: Maximal assist (Pt 25 - 49%/lift and lower) Chair/bed transfer assistive device: Armrests Chair/bed transfer details: Manual facilitation for placement, Manual facilitation for weight shifting, Verbal cues for technique, Tactile cues for sequencing, Tactile cues for weight shifting, Verbal cues for safe use of DME/AE, Verbal cues for sequencing  Function - Locomotion: Wheelchair Will patient use wheelchair at discharge?: Yes Type: Manual Max wheelchair distance: 300 Assist Level: No help, No cues, assistive device, takes more than reasonable amount of time Wheel 50 feet with 2 turns activity did not occur: Safety/medical concerns Assist Level: No help, No cues, assistive device, takes more than reasonable amount of time Wheel 150 feet activity did not occur: Safety/medical concerns Assist Level: No help, No cues, assistive device, takes more than reasonable amount of time Turns around,maneuvers to table,bed, and toilet,negotiates 3% grade,maneuvers on rugs and over doorsills: Yes Function - Locomotion:  Ambulation Ambulation activity did not occur: Safety/medical concerns Walk 10 feet activity did not occur: Safety/medical concerns Walk 50 feet with 2 turns activity did not occur: Safety/medical concerns Walk 150 feet activity did not occur: Safety/medical concerns Walk 10 feet on uneven surfaces activity did not occur: Safety/medical concerns  Function - Comprehension Comprehension: Auditory Comprehension assist level: Follows complex conversation/direction with no assist  Function - Expression Expression: Verbal Expression assist level: Expresses complex ideas: With no assist  Function - Social Interaction Social Interaction assist level: Interacts appropriately with others - No medications needed.  Function - Problem Solving Problem solving assist level: Solves complex problems: Recognizes & self-corrects  Function - Memory Memory assist level: Complete Independence: No helper Patient normally able to recall (first 3 days only): Current season, Location of own room, Staff names and faces, That he or she is in a hospital   Medical Problem List and Plan: 1.  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  Cont CIR  -reviewed her SCI and sensory level which likely is contributing to complaints above 2.  DVT Prophylaxis/Anticoagulation/history of pulmonary emboli: Xarelto.  Monitor for any bleeding episodes 3. Pain Management/spasticity:   -pt with lethargy and nausea better with reduction of baclofen to 10mg  QID (however she used this and higher doses before)   -continue tramadol for more severe pain  -continue tylenol prn  -will ad tizanidine to see if we can help tone (watch carefully for tolerance) 4. Mood: Zoloft 25 mg daily.  Provide emotional support 5. Neuropsych: This patient is capable of making decisions on her  own behalf. 6. Skin/Wound Care: Routine skin checks, Left  knee incision well healed 7. Fluids/Electrolytes/Nutrition:   BMP within acceptable range on 10/22 8.  Left femoral shaft fracture.  Status post intramedullary retrograde nailing 09/11/2017.  WBAT per ortho 9.  Acute blood loss anemia.    Hemoglobin 7.4 on 7/29    10.  Hyperglycemia secondary to steroid.  improved  -dc'ed cbg checks and SSI 11.  Neurogenic bowel/ bladder.    - PM bowel program with some improvement  -discuss ditropan adjustment with patient  -100k GNR   bactrim DS through 7/30    12.  Hypothyroidism.  Synthroid 13. Thrombocytopenia -Resolved 14.  Sinus symptoms, flonase, added ocean nasal spray , add claritin 15.  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

## 2017-10-21 ENCOUNTER — Encounter (HOSPITAL_COMMUNITY): Payer: Medicare Other | Admitting: Psychology

## 2017-10-21 ENCOUNTER — Inpatient Hospital Stay (HOSPITAL_COMMUNITY): Payer: Medicare Other | Admitting: *Deleted

## 2017-10-21 ENCOUNTER — Inpatient Hospital Stay (HOSPITAL_COMMUNITY): Payer: Medicare Other | Admitting: Occupational Therapy

## 2017-10-21 ENCOUNTER — Inpatient Hospital Stay (HOSPITAL_COMMUNITY): Payer: Medicare Other | Admitting: Physical Therapy

## 2017-10-21 NOTE — Patient Care Conference (Signed)
Inpatient RehabilitationTeam Conference and Plan of Care Update Date: 10/20/2017   Time: 2:00 PM    Patient Name: Sarah [name]      Medical Record Number: [id number]  Date of Birth: 22-Jul-1965 Sex: Female         Room/Bed: 4W18C/4W18C-01 Payor Info: Payor: [employer] MEDICARE / Plan: [facility] MEDICARE / Product Type: *No Product type* /    Admitting Diagnosis: T4 Para  Admit Date/Time:  09/21/2017  4:57 PM Admission Comments: No comment available   Primary Diagnosis:  <principal problem not specified> Principal Problem: <principal problem not specified>  Patient Active Problem List   Diagnosis Date Noted  . Fracture   . Acute lower UTI   . Myelopathy ([place]) 09/21/2017  . Muscle spasms of both lower extremities   . Closed fracture of shaft of left femur ([place])   . Acute blood loss anemia   . Hypothyroidism   . Nausea & vomiting 09/15/2017  . Closed fracture of left distal femur ([place]) 09/11/2017  . Paraplegia ([place]) 09/08/2017  . Obesity (BMI 30-39.9) 09/03/2017  . Status post surgery 09/03/2017  . Calculus of gallbladder without cholecystitis without obstruction   . Biliary dyskinesia   . Intractable nausea and vomiting 08/09/2017  . Thrombocytopenia ([place]) 08/09/2017  . Elevated bilirubin 08/09/2017  . AKI (acute kidney injury) ([place]) 08/09/2017  . Nasal congestion 04/05/2017  . Breast pain 01/26/2017  . Upper respiratory infection, viral 01/26/2017  . Counseling regarding advanced care planning and goals of care 01/02/2017  . Bilateral rotator cuff syndrome 02/12/2016  . E. coli UTI 08/01/2015  . Constipation due to neurogenic bowel 08/01/2015  . Muscle spasticity   . 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 Spastic paraparesis   . Neurogenic bladder   . History of pulmonary embolism   . Post-operative pain   . Metastatic cancer ([place])   . Steroid-induced hyperglycemia   . Depression   . Obstipation   . Thoracic spine tumor 07/06/2015  . 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 Spinal cord compression due to malignant neoplasm metastatic to spine ([place])   . Postoperative hypothyroidism   . 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 Spasticity 09/19/2014  . Dysuria 09/04/2014  . Hypertension 05/01/2014  . Ingrown right big toenail 05/01/2014  . GERD (gastroesophageal reflux disease) 02/14/2014  . Dysphagia, pharyngoesophageal phase 02/06/2014  . Type 2 diabetes mellitus without complication ([place]) 83/15/1761  . Constipation   . Neurogenic bowel   . Paraplegia at T4 level ([place]) 01/20/2014  . Metastatic cancer to spine ([place]) 01/19/2014  . Rotator cuff tendonitis   . 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 Leg weakness, bilateral 12/13/2013    Expected Discharge Date: Expected Discharge Date: (change plan to SNF)  Team Members Present: Physician leading conference: Dr. Alger Simons Social Worker Present: Lennart Pall, LCSW Nurse Present: Arelia Sneddon, RN PT Present: Dwyane Dee, PT OT Present: Willeen Cass, OT SLP Present: Charolett Bumpers, SLP PPS Coordinator present : Daiva Nakayama, RN, CRRN     Current Status/Progress Goal Weekly Team Focus  Medical   ongoing tone, baclofen decreased for lethargy. bladder incontinence with decr ditropan  see prior, stabilize medically for discharge  establish plan for bladder emptying at home, reduce tone   Bowel/Bladder   Incontinent of bowel/bladder with I&O cath Q6hrs. Bowel program with dig stim, LBM 10/19/17         Swallow/Nutrition/  Hydration             ADL's   max - total A slide board transfer, set up A UB self care, min A LB self care, bathing from bed level with min A  min A bathing,LB self care ; max toileting/toilet transfer, and supervision/set up for all other goals  pt/family educ, functional transfers, strengthening, ADL retraining, core strengthening, endurance   Mobility    mod I w/c management, min A for bde mobility for LE management with leg loops, mod A for even lateral scoot transfers with slide board, max A for uneven transfers with slide board   bed mobility min A, bed > chair transfers mod A, and Mod I for all w/c propulsion  uneven and even transfers with slide board, bed mobility with leg loops, UE strengthening, activity tolerance, w/c propulsion, and dynamic sitting balance.   Communication             Safety/Cognition/ Behavioral Observations            Pain   Continue with current pain regime with Tylenol for back pain  <2  Assess and treat pain q shift and as needed   Skin   surgical incision to the back and leg, OTA  Remain free from infection and skin breakdown  Assess skin q shift and as needed      *See Care Plan and progress notes for long and short-term goals.     Barriers to Discharge  Current Status/Progress Possible Resolutions Date Resolved   Physician    Medical stability        see medical progress notes, wb advanced last week      Nursing                  PT                    OT                  SLP                SW                Discharge Planning/Teaching Needs:  Plan has changed to SNF as family cannot meet current care needs nor provide recommended 24/7 care.  NA   Team Discussion:  Continue to address bladder management.  DC plan now changed to SNF.  Still mod assist with transfers but can be as much as total assist with OT.  Family cannot manage this level of care.  Revisions to Treatment Plan:  Change in d/c date    Continued Need for Acute Rehabilitation Level of Care: The patient requires daily medical management by a physician with specialized training in physical medicine and rehabilitation for the following conditions: Daily direction of a multidisciplinary physical rehabilitation program to ensure safe treatment while eliciting the highest outcome that is of practical value to the patient.:  Yes Daily medical management of patient stability for increased activity during participation in an intensive rehabilitation regime.: Yes Daily analysis of laboratory values and/or radiology reports with any subsequent need for medication adjustment of medical intervention for : Neurological problems;Post surgical problems  Mckenna Boruff 10/21/2017, 2:07 PM

## 2017-10-21 NOTE — Progress Notes (Deleted)
Arrival of frail young lady, ,SOB with exertion. Arrives per wheelchair. Reviewed safety plan with pt.

## 2017-10-21 NOTE — Progress Notes (Signed)
Subjective/Complaints: Up in bathroom with OT.  No new complaints today   Objective: Vital Signs: Blood pressure 96/73, pulse 64, temperature (!) 97.5 F (36.4 C), temperature source Oral, resp. rate 17, height 5\' 11"  (1.803 m), weight 103.2 kg (227 lb 9.8 oz), last menstrual period 10/22/2016, SpO2 100 %. No results found. Results for orders placed or performed during the hospital encounter of 09/21/17 (from the past 72 hour(s))  CBC     Status: Abnormal   Collection Time: 10/19/17  5:31 AM  Result Value Ref Range   WBC 3.0 (L) 4.0 - 10.5 K/uL   RBC 2.29 (L) 3.87 - 5.11 MIL/uL   Hemoglobin 7.4 (L) 12.0 - 15.0 g/dL   HCT 24.2 (L) 36.0 - 46.0 %   MCV 105.7 (H) 78.0 - 100.0 fL   MCH 32.3 26.0 - 34.0 pg   MCHC 30.6 30.0 - 36.0 g/dL   RDW 19.4 (H) 11.5 - 15.5 %   Platelets 176 150 - 400 K/uL    Comment: Performed at [facility], [place] [address], [place], [place] [address]  Glucose, capillary     Status: None   Collection Time: 10/20/17 10:19 AM  Result Value Ref Range   Glucose-Capillary 87 70 - 99 mg/dL   *Note: Due to a large number of results and/or encounters for the requested time period, some results have not been displayed. A complete set of results can be found in Results Review.     General: No acute distress HEENT: EOMI, oral membranes moist Cards: reg rate  Chest: normal effort Abdomen: Soft, NT, ND Skin: dry, intact Extremities: 1+ LE edema.  Neuro: alert and oriented  Motor: bilateral upper extremity 5/5 proximal distal Bilateral lower extremities 0/5 proximal to distal, sensory level at lower sternum Musc/Skel:  No edema or tenderness in extremities Psych:pleasant Skin: warm and dry. Intact.   Assessment/Plan: 1. Functional deficits secondary to T3 paraplegia which require 3+ hours per day of interdisciplinary therapy in a comprehensive inpatient rehab setting. Physiatrist is providing close team supervision and 24 hour management of active  medical problems listed below. Physiatrist and rehab team continue to assess barriers to discharge/monitor patient progress toward functional and medical goals. FIM: Function - Bathing Position: Bed Body parts bathed by patient: Right arm, Left arm, Chest, Abdomen, Right upper leg, Left upper leg, Right lower leg, Left lower leg Body parts bathed by helper: Back Bathing not applicable: Front perineal area, Buttocks Assist Level: Supervision or verbal cues, Set up Assistive Device Comment: LH sponge and reacher Set up : To obtain items  Function- Upper Body Dressing/Undressing What is the patient wearing?: Pull over shirt/dress Bra - Perfomed by patient: Thread/unthread right bra strap, Thread/unthread left bra strap, Hook/unhook bra (pull down sports bra) Bra - Perfomed by helper: Hook/unhook bra (pull down sports bra) Pull over shirt/dress - Perfomed by patient: Thread/unthread right sleeve, Thread/unthread left sleeve, Put head through opening, Pull shirt over trunk Pull over shirt/dress - Perfomed by helper: Pull shirt over trunk Assist Level: Set up, Supervision or verbal cues Set up : To obtain clothing/put away Function - Lower Body Dressing/Undressing What is the patient wearing?: Pants, Ted Hose, Shoes Position: Bed Pants- Performed by patient: Thread/unthread right pants leg, Thread/unthread left pants leg, Pull pants up/down Pants- Performed by helper: Pull pants up/down Shoes - Performed by helper: Don/doff right shoe, Don/doff left shoe, Fasten right, Fasten left TED Hose - Performed by helper: Don/doff right TED hose, Don/doff left TED  hose Assist for footwear: Dependant Assist for lower body dressing: Touching or steadying assistance (Pt > 75%)  Function - Toileting Toileting activity did not occur: No continent bowel/bladder event Toileting steps completed by patient: Adjust clothing prior to toileting, Performs perineal hygiene, Adjust clothing after  toileting Toileting steps completed by helper: Adjust clothing prior to toileting, Performs perineal hygiene, Adjust clothing after toileting Assist level: Two helpers  Function - Air cabin crew transfer activity did not occur: N/A Toilet transfer assistive device: Bedside commode Assist level to toilet: 2 helpers(per Wyvonnia Lora, NT) Assist level from toilet: 2 helpers Assist level to bedside commode (at bedside): Maximal assist (Pt 25 - 49%/lift and lower) Assist level from bedside commode (at bedside): 2 helpers  Function - Chair/bed transfer Chair/bed transfer activity did not occur: N/A Chair/bed transfer method: Lateral scoot Chair/bed transfer assist level: Maximal assist (Pt 25 - 49%/lift and lower) Chair/bed transfer assistive device: Armrests Chair/bed transfer details: Manual facilitation for placement, Manual facilitation for weight shifting, Tactile cues for sequencing, Tactile cues for weight shifting, Verbal cues for safe use of DME/AE  Function - Locomotion: Wheelchair Will patient use wheelchair at discharge?: Yes Type: Manual Max wheelchair distance: 300 Assist Level: No help, No cues, assistive device, takes more than reasonable amount of time Wheel 50 feet with 2 turns activity did not occur: Safety/medical concerns Assist Level: No help, No cues, assistive device, takes more than reasonable amount of time Wheel 150 feet activity did not occur: Safety/medical concerns Assist Level: No help, No cues, assistive device, takes more than reasonable amount of time Turns around,maneuvers to table,bed, and toilet,negotiates 3% grade,maneuvers on rugs and over doorsills: Yes Function - Locomotion: Ambulation Ambulation activity did not occur: Safety/medical concerns Walk 10 feet activity did not occur: Safety/medical concerns Walk 50 feet with 2 turns activity did not occur: Safety/medical concerns Walk 150 feet activity did not occur: Safety/medical  concerns Walk 10 feet on uneven surfaces activity did not occur: Safety/medical concerns  Function - Comprehension Comprehension: Auditory Comprehension assist level: Follows complex conversation/direction with no assist  Function - Expression Expression: Verbal Expression assist level: Expresses complex ideas: With no assist  Function - Social Interaction Social Interaction assist level: Interacts appropriately with others - No medications needed.  Function - Problem Solving Problem solving assist level: Solves complex problems: Recognizes & self-corrects  Function - Memory Memory assist level: Complete Independence: No helper Patient normally able to recall (first 3 days only): Current season, Location of own room, Staff names and faces, That he or she is in a hospital   Medical Problem List and Plan: 1.  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  Cont CIR  -may need to consider SNF given lack of social supports during day 2.  DVT Prophylaxis/Anticoagulation/history of pulmonary emboli: Xarelto.  Monitor for any bleeding episodes 3. Pain Management/spasticity:   -pt with lethargy and nausea better with reduction of baclofen to 10mg  QID (however she used this and higher doses before)   -continue tramadol for more severe pain  -continue tylenol prn  -will ad tizanidine to see if we can help tone (watch carefully for tolerance) 4. Mood: Zoloft 25 mg daily.  Provide emotional support 5. Neuropsych: This patient is capable of making decisions on her own behalf. 6. Skin/Wound Care: Routine skin checks, Left knee incision well healed 7. Fluids/Electrolytes/Nutrition:   BMP within acceptable range on 10/22 8.  Left  femoral shaft fracture.  Status post intramedullary retrograde nailing 09/11/2017.  WBAT per ortho 9.  Acute blood loss anemia.    Hemoglobin 7.4 on 7/29    10.   Hyperglycemia secondary to steroid.  improved  -dc'ed cbg checks and SSI 11.  Neurogenic bowel/ bladder.    - PM bowel program with some improvement  -discuss ditropan adjustment with patient  -100k GNR   bactrim DS completed    12.  Hypothyroidism.  Synthroid 13. Thrombocytopenia -Resolved 14.  Sinus symptoms, flonase, added ocean nasal spray  claritin 15.  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

## 2017-10-21 NOTE — Progress Notes (Signed)
Occupational Therapy Session Note  Patient Details  Name: Sarah [name] MRN: [id number] Date of Birth: 05/11/1965  Today's Date: 10/21/2017 OT Individual Time: 6754-4920 and 1007-1219 OT Individual Time Calculation (min): 56 min and 73 min   Short Term Goals: Week 3:  OT Short Term Goal 1 (Week 3): STGs = LTGs  Skilled Therapeutic Interventions/Progress Updates:    Session 1: Upon entering the room, pt supine in bed with no c/o pain. OT assisting pt with donning B TED hose and pt donning LB clothing with use of reacher to thread clothing over feet and rolling with min A to pull over B hips. OT assisted pt with donning leg loops and pt utilized for supine >sit. Pt continued to require assistance for trunk when coming to seated position. Slide board transfer with max A from bed >wheelchair. Pt washing UB, hair, and performing grooming at sink from wheelchair level with supervision/set up to obtain items. Pt remained at sink finishing grooming as OT exited the room.   Session 2: Upon entering the room, pt supine in bed and rolled to the L with min A and use of bed rail. Pt able to pull B LE's off bed and required min A for trunk to come into seated position. Pt placed slide board and performed transfer from bed >wheelchair with mod A. Pt propelled wheelchair 400' with increased time towards main entrance. OT assisted pt the rest of the way outside for energy conservation. Once outside, pt demonstrated 3 sets of 15 chest pulls, shoulder elevation, shoulder diagonals, alternating punches, and bicep curls with use of level 2 theraband. Pt taking rest break between reps secondary to fatigue and min verbal cues for proper technique. Pt propelled wheelchair on variety of surfaces outside with increased time 100' and OT assisted pt back to room for time management. Pt remained seated in wheelchair with call bell and all needed items within reach.   Therapy Documentation Precautions:   Precautions Precautions: Fall Precaution Comments: throracic region, LEs spasicity  Restrictions Weight Bearing Restrictions: Yes LLE Weight Bearing: Touchdown weight bearing Other Position/Activity Restrictions: B LEs(Order updated 10/14/17) :   ADL: ADL ADL Comments: see functional navigator  See Function Navigator for Current Functional Status.   Therapy/Group: Individual Therapy  [name] 10/21/2017, 12:45 PM

## 2017-10-21 NOTE — NC FL2 (Signed)
[place] LEVEL OF CARE SCREENING TOOL     IDENTIFICATION  Patient Name: Sarah [name] Birthdate: Jul 10, 1965 Sex: female Admission Date (Current Location): 09/21/2017  [facility] and [place] Number:  Herbalist and Address:  [facility] [place], [place] [address] [place], [place] [address]      Provider Number: [id number]  Attending Physician Name and Address:  [name], MD  Relative Name and Phone Number:       Current Level of Care: Other (Comment)(Acute Inpatient Rehab Unit) Recommended Level of Care: [place] Prior Approval Number:    Date Approved/Denied:   PASRR Number: [id number]  Discharge Plan: SNF    Current Diagnoses: Patient Active Problem List   Diagnosis Date Noted  . Fracture   . Acute lower UTI   . Myelopathy ([place]) 09/21/2017  . Muscle spasms of both lower extremities   . Closed fracture of shaft of left femur ([place])   . Acute blood loss anemia   . Hypothyroidism   . Nausea & vomiting 09/15/2017  . Closed fracture of left distal femur ([place]) 09/11/2017  . Paraplegia ([place]) 09/08/2017  . Obesity (BMI 30-39.9) 09/03/2017  . Status post surgery 09/03/2017  . Calculus of gallbladder without cholecystitis without obstruction   . Biliary dyskinesia   . Intractable nausea and vomiting 08/09/2017  . Thrombocytopenia ([place]) 08/09/2017  . Elevated bilirubin 08/09/2017  . AKI (acute kidney injury) ([place]) 08/09/2017  . Nasal congestion 04/05/2017  . Breast pain 01/26/2017  . Upper respiratory infection, viral 01/26/2017  . Counseling regarding advanced care planning and goals of care 01/02/2017  . Bilateral rotator cuff syndrome 02/12/2016  . E. coli UTI 08/01/2015  . Constipation due to neurogenic bowel 08/01/2015  . Muscle spasticity   . 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 Spastic paraparesis   . Neurogenic bladder   . History of pulmonary embolism   . Post-operative pain   . Metastatic cancer  ([place])   . Steroid-induced hyperglycemia   . Depression   . Obstipation   . Thoracic spine tumor 07/06/2015  . 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 ([place]) 07/06/2015  . Spinal cord compression due to malignant neoplasm metastatic to spine ([place])   . Postoperative hypothyroidism   . 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 Spasticity 09/19/2014  . Dysuria 09/04/2014  . Hypertension 05/01/2014  . Ingrown right big toenail 05/01/2014  . GERD (gastroesophageal reflux disease) 02/14/2014  . Dysphagia, pharyngoesophageal phase 02/06/2014  . Type 2 diabetes mellitus without complication ([place]) 54/65/0354  . Constipation   . Neurogenic bowel   . 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 Metastatic cancer to spine ([place]) 01/19/2014  . Rotator cuff tendonitis   . 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 Leg weakness, bilateral 12/13/2013    Orientation RESPIRATION BLADDER Height & Weight     Self, Time, Situation, Place  Normal External catheter(I/O caths) Weight: 103.2 kg (227 lb 9.8 oz) Height:  5\' 11"  (180.3 cm)  BEHAVIORAL SYMPTOMS/MOOD NEUROLOGICAL BOWEL NUTRITION STATUS      Incontinent(can be continent with planned bowel program) Diet(reg)  AMBULATORY STATUS COMMUNICATION OF NEEDS Skin   Total Care Verbally Surgical wounds                       Personal Care Assistance Level of Assistance  Bathing, Dressing Bathing Assistance: Maximum assistance  Dressing Assistance: Maximum assistance     Functional Limitations Info             SPECIAL CARE FACTORS FREQUENCY  PT (By licensed PT), OT (By licensed OT)     PT Frequency: 5xwk OT Frequency: 5x/wk            Contractures Contractures Info: Not present    Additional Factors Info  Code Status, Allergies Code Status Info: Full Allergies Info: see MAR           Current Medications (10/21/2017):  This is the  current hospital active medication list Current Facility-Administered Medications  Medication Dose Route Frequency Provider Last Rate Last Dose  . acetaminophen (TYLENOL) tablet 650 mg  650 mg Oral Q6H PRN Cathlyn Parsons, PA-C   650 mg at 10/20/17 2200  . baclofen (LIORESAL) tablet 10 mg  10 mg Oral TID Cathlyn Parsons, PA-C   10 mg at 10/21/17 1339  . bisacodyl (DULCOLAX) suppository 10 mg  10 mg Rectal Q0600 [name], MD   10 mg at 10/20/17 1714  . famotidine (PEPCID) tablet 20 mg  20 mg Oral BID Cathlyn Parsons, PA-C   20 mg at 10/21/17 5102  . fluticasone (FLONASE) 50 MCG/ACT nasal spray 1 spray  1 spray Each Nare Q24H [name], MD   1 spray at 10/20/17 2203  . levothyroxine (SYNTHROID, LEVOTHROID) tablet 137 mcg  137 mcg Oral QAC breakfast Cathlyn Parsons, PA-C   137 mcg at 10/21/17 5852  . loratadine (CLARITIN) tablet 10 mg  10 mg Oral Daily Kirsteins, Luanna Salk, MD   10 mg at 10/21/17 0802  . magnesium hydroxide (MILK OF MAGNESIA) suspension 30 mL  30 mL Oral Daily PRN Bary Leriche, PA-C   30 mL at 10/15/17 1058  . ondansetron (ZOFRAN) tablet 4 mg  4 mg Oral Q6H PRN Cathlyn Parsons, PA-C   4 mg at 10/17/17 1529   Or  . ondansetron (ZOFRAN) injection 4 mg  4 mg Intravenous Q6H PRN Angiulli, Lavon Paganini, PA-C      . ondansetron [facility]) injection 4 mg  4 mg Intramuscular Q6H PRN [name], MD   4 mg at 10/12/17 0540  . oxybutynin (DITROPAN) tablet 2.5 mg  2.5 mg Oral TID [name], MD   2.5 mg at 10/21/17 1338  . pantoprazole (PROTONIX) EC tablet 40 mg  40 mg Oral Daily Bary Leriche, PA-C   40 mg at 10/20/17 2202  . polyethylene glycol (MIRALAX / GLYCOLAX) packet 17 g  17 g Oral Daily [name], MD   17 g at 10/21/17 1338  . polyethylene glycol (MIRALAX / GLYCOLAX) packet 17 g  17 g Oral Daily PRN Love, Pamela S, PA-C      . rivaroxaban (XARELTO) tablet 20 mg  20 mg Oral Q supper Cathlyn Parsons, PA-C   20 mg at 10/20/17 1714   . senna-docusate (Senokot-S) tablet 2 tablet  2 tablet Oral BID [name], MD   2 tablet at 10/21/17 0802  . sertraline (ZOLOFT) tablet 25 mg  25 mg Oral Daily Cathlyn Parsons, PA-C   25 mg at 10/21/17 0802  . sodium chloride (OCEAN) 0.65 % nasal spray 1 spray  1 spray Each Nare PRN Love, Pamela S, PA-C      . sodium phosphate (FLEET) 7-19 GM/118ML enema 1 enema  1 enema Rectal Daily PRN Angiulli, Lavon Paganini, PA-C      .  sucralfate (CARAFATE) 1 GM/10ML suspension 1 g  1 g Oral TID WC & HS Love, Ivan Anchors, PA-C   1 g at 10/21/17 1159  . tiZANidine (ZANAFLEX) tablet 2 mg  2 mg Oral BID [name], MD   2 mg at 10/21/17 0802  . traMADol (ULTRAM) tablet 50 mg  50 mg Oral Q6H PRN [name], MD   50 mg at 10/11/17 7703     Discharge Medications: Please see discharge summary for a list of discharge medications.  Relevant Imaging Results:  Relevant Lab Results:   Additional Information SS#  [id number]  Lennart Pall, LCSW

## 2017-10-21 NOTE — Consult Note (Signed)
Neuropsychological Consultation   Patient:   Sarah [name]   DOB:   [birth year]  MR Number:  [id number]  Location:  [facility] [address] [id number] [place] [address] Dept: [place]: [phone]           Date of Service:   10/21/2017  Start Time:   7 50 AM End Time:   8:50 AM  Provider/Observer:  Ilean Skill, Psy.D.       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  Patient has had multiple prior admissions.  Presented 09/02/2017 with increasing leg weakness over prior two weeks.  [name] being followed by oncology as well as neurosurgery (Dr. Christella Noa).  I had seen patient during prior admissions to CIR.  Patient has had extended stay now.  Her mood is better.  She had a time with bleeding then few days time of confusion during stay with 1 day off unit.    Reason for Service:  Sarah [name] was referred for neuropsychological/psychological consultation due to coping and adjustment issues.  Below is the HPI for the current admission.  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  Per chart review she lives with sister and brother-in-law.  One level home with ramped entrance.  Up until recently she was using a sliding board for transfers and ambulating short distances with a rolling walker up until May 2019.  She had been using a wheelchair most the time for the past few years otherwise.  She has had 2 previous back surgeries T3 in 2015 and 2017 he did receive inpatient rehab services.  Presented 09/02/2017 with recent fall increasing leg weakness over the  past 2 weeks.  She was to see her oncologist 08/24/2017 for follow-up MRI that was planned however could not make the appointment due to lower extremity weakness.  She did also have some urinary incontinence with noted neurogenic bladder.  MRI and imaging revealed tumor recurrence at T3 with severe spinal cord compression.  Underwent posterior spinal tumor resection T3 09/03/2017 per Dr. Christella Noa.  Radiation oncology follow-up Dr. Dara Lords to follow-up outpatient for plan radiation therapy.  Decadron protocol was indicated.  Hospital course anemia and monitored.  E. coli UTI completed a course of Rocephin.  Physical and occupational therapy evaluations completed and patient was admitted for a comprehensive rehab program 09/08/2017.  Follow-up labs on admission to rehab services showed hemoglobin 6.2 that was confirmed and patient was transfused.  Patient's initial plan was to resume Xarelto for history of pulmonary emboli but held due to anemia.  CT of lumbar and thoracic spine as well as pelvis were unremarkable.  Hemoglobin rebounded 8.7.  Patient with ongoing complaints of left knee pain and some effusion.  X-rays and imaging revealed left femoral shaft fracture likely felt to be subacute with noted history of recent fall.  Orthopedic services consulted she was discharged to acute care services and underwent intramedullary retrograde femoral nailing 09/11/2017 per Dr. Marcelino Scot.  Nonweightbearing left lower extremity.  Her Xarelto was resumed postoperatively.  Close monitoring of hemoglobin and hematocrit.  Physical and Occupational Therapy  evaluations completed postoperatively 6/22 and patient was admitted to inpatient rehab services 09/15/2017.  Upon admission to inpatient rehab services patient with intractable nausea vomiting noted bouts of constipation.  She was again discharged back to acute care services.  Abdominal film showed nonobstructive bowel gas pattern.  Moderate colorectal stool volume indicating  constipation.  Ultrasound the abdomen showed cholelithiasis without complicating factors.  Bowel program was established.  Nausea vomiting greatly improved.  Patient again is readmitted back to inpatient rehab services for comprehensive rehab program.  Current Status:  Sarah [name] was well oriented x4 with good cognitive and executive functioning again today.  Will patient has had extended stay the patient reports that she is now doing better, mental status was very good today.  Memory of recent and past events clear with good recall.  The patient reports that she is a little unclear about what all speciality consults are doing on timing with patient being planned for SNF after discharge from here.  Mood good with denial of depressive or anxiety symptoms.    Behavioral Observation: Sarah [name]  presents as a 52 y.o.-year-old Right Caucasian Female who appeared her stated age. her dress was Appropriate and she was Well Groomed and her manners were Appropriate to the situation.  her participation was indicative of Appropriate and Attentive behaviors.  There were any physical disabilities noted.  she displayed an appropriate level of cooperation and motivation.     Interactions:    Active Appropriate and Attentive  Attention:   within normal limits and attention span and concentration were age appropriate  Memory:   within normal limits; recent and remote memory intact  Visuo-spatial:  not examined  Speech (Volume):  normal  Speech:   normal; normal  Thought Process:  Coherent and Relevant  Though Content:  WNL; not suicidal and not homicidal  Orientation:   person, place, time/date and situation  Judgment:   Good  Planning:   Good  Affect:    Appropriate  Mood:    Anxious  Insight:   Good  Intelligence:   normal   Medical History:   Past Medical History:  Diagnosis Date  . Cancer [facility])    FNA of thyroid positive for onconytic/hurthle cell carcinoma  . Chronic back pain    . DDD (degenerative disc disease), cervical   . DJD (degenerative joint disease)   . Family history of adverse reaction to anesthesia    MOTHER HAS NAUSEA   . History of rectal fissure   . HIT (heparin-induced thrombocytopenia) ([place])   . Hypertension   . Obesity   . Paraplegia at T4 level ([place])   . Pulmonary embolus, right ([place]) 2015  . Rotator cuff tendonitis right   Psychiatric History:  While the patient has no prior psychiatric history she has been dealing with stage IV  Hurthle cell carcinoma with mets to the spine for several years now.  Family Med/Psych History:  Family History  Problem Relation Age of Onset  . Hypertension Mother   . Diabetes Mother   . Hypertension Father   . Stroke Father   . Diabetes Father   . Diabetes Sister   . Diabetes Sister     Risk of Suicide/Violence: low Patient denies SI or HI.  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  Patient has had multiple prior admissions.  Presented 09/02/2017 with increasing leg weakness  over prior two weeks.  [name] being followed by oncology as well as neurosurgery (Dr. Christella Noa).  I had seen patient during prior admissions to CIR.  Patient has had extended stay now.  Her mood is better.  She had a time with bleeding then few days time of confusion during stay with 1 day off unit.   Alpha Chouinard was well oriented x4 with good cognitive and executive functioning again today.  Will patient has had extended stay the patient reports that she is now doing better, mental status was very good today.  Memory of recent and past events clear with good recall.  The patient reports that she is a little unclear about what all speciality consults are doing on timing with patient being planned for SNF after discharge from here.  Mood good with denial of depressive or anxiety symptoms.   During the current visit we  continued to work on stressors regarding ongoing care from both Neurosurgery and oncology.  Patient unclear what procedures are going to be tried to be completed prior to discharge here and what will be done during SNF vs once discharged to home.       Electronically Signed   _______________________ Ilean Skill, Psy.D.

## 2017-10-21 NOTE — Progress Notes (Signed)
Physical Therapy Session Note  Patient Details  Name: Sarah [name] MRN: [id number] Date of Birth: December 19, 1965  Today's Date: 10/21/2017 PT Individual Time: 0900-1000 PT Individual Time Calculation (min): 60 min   Short Term Goals: Week 4:  PT Short Term Goal 1 (Week 4): LTG=STG due to ELOS  Skilled Therapeutic Interventions/Progress Updates:    Pt reports no pain prior to session start and indicates she feels "groggy" which may be attributed to her medications. Pt propels w/c from room<> gym mod I 300 feet. Pt aligns chairs, manages parts, and verbalizes correct sequencing consistently for slide board transfer from w/c<>mat. Pt performs scoot anteriorly with mod A for LE management. Pt requires mod A for board placement and boosting for slide board transfer to even surfaces. Pt performed x1 slide board transfers from w/c<>mat mod A for boosting. Pt then performs x1 slide board transfer from w/c> mat working on uneven transfers with max A for lateral boost and posteriorly weight shifting onto mat. Pt then performs sitting edge of mat>supine with blue wedge with min A for LE management using leg loops. PT passively stretches Pt for 3x30 sec for hamstrings and glutes for inc flexibility. Pt performs supine> sitting edge of mat with mod A for LE management and trunk. Pt performed slide board transfer from mat>w/c on the decline for efficiency with transfer and therapist providing mod A for boosting. Pt propelled w/c back to room mod I and was returned sitting in room with all needs met.   Therapy Documentation Precautions:  Precautions Precautions: Fall Precaution Comments: throracic region, LEs spasicity  Restrictions Weight Bearing Restrictions: Yes LLE Weight Bearing: Touchdown weight bearing Other Position/Activity Restrictions: B LEs(Order updated 10/14/17)   See Function Navigator for Current Functional Status.   Therapy/Group: Individual Therapy  Floreen Comber 10/21/2017, 12:15 PM

## 2017-10-21 NOTE — Progress Notes (Signed)
Social Work Patient ID: Sarah [name], female   DOB: 10/26/1965, 52 y.o.   MRN: [id number]  Have reviewed team conference with pt who is agreeable with plan to change d/c to SNF given her current assistance needs.  She admits she is disappointed in this change, however, feels it is necessary at this point.  Will begin bed search.  Jalaina Salyers, LCSW

## 2017-10-22 ENCOUNTER — Telehealth: Payer: Self-pay | Admitting: Radiation Therapy

## 2017-10-22 ENCOUNTER — Inpatient Hospital Stay (HOSPITAL_COMMUNITY): Payer: Medicare Other

## 2017-10-22 ENCOUNTER — Inpatient Hospital Stay (HOSPITAL_COMMUNITY): Payer: Medicare Other | Admitting: Physical Therapy

## 2017-10-22 ENCOUNTER — Ambulatory Visit
Admit: 2017-10-22 | Discharge: 2017-10-22 | Disposition: A | Discharge status: Home / Self Care | Payer: Medicare Other | Attending: Radiation Oncology | Admitting: Radiation Oncology

## 2017-10-22 ENCOUNTER — Encounter: Payer: Self-pay | Admitting: Radiation Therapy

## 2017-10-22 DIAGNOSIS — C7951 Secondary malignant neoplasm of bone: Secondary | ICD-10-CM

## 2017-10-22 MED ORDER — TIZANIDINE HCL 2 MG PO TABS
2.0000 mg | ORAL_TABLET | Freq: Four times a day (QID) | ORAL | Status: DC
  Administered 2017-10-22 – 2017-10-23 (×7): 2 mg via ORAL
  Filled 2017-10-22 (×8): qty 1

## 2017-10-22 NOTE — Progress Notes (Signed)
Occupational Therapy Session Note  Patient Details  Name: Sarah [name] MRN: [id number] Date of Birth: Dec 01, 1965  Today's Date: 10/22/2017 OT Individual Time: 1100-1154 OT Individual Time Calculation (min): 54 min    Short Term Goals: Week 4:  OT Short Term Goal 1 (Week 4): STGs=LTGs secondary to upcoming discharge  Skilled Therapeutic Interventions/Progress Updates:    1:1. Pt seated in w/c and no d/o pain. Pt changes shirt with set up and propels w/c throughout unit with MOD I. Pt requesting to work on LE stretching for spasticity and improved ease with LB dressing at bed level. OT provides prolonged stretch of hamstring, gastroc, internal/external rotators for at least 2 min per motion while supine on wedge on mat. Pt completes slide board transfers with overall MOD A and VC for head hips relationship and hand placement throughout transfer to improve push across level surface. Pt provided increased time to problem solve LE management with leg loops throughout transfer. Exited session with pt seated in w/c with call light in reach and all needs met  Therapy Documentation Precautions:  Precautions Precautions: Fall Precaution Comments: throracic region, LEs spasicity  Restrictions Weight Bearing Restrictions: Yes LLE Weight Bearing: Touchdown weight bearing Other Position/Activity Restrictions: B LEs(Order updated 10/14/17) ADL: ADL ADL Comments: see functional navigator  See Function Navigator for Current Functional Status.   Therapy/Group: Individual Therapy  Tonny Branch 10/22/2017, 11:58 AM

## 2017-10-22 NOTE — Progress Notes (Signed)
Subjective/Complaints: Still reports smothering feeling from abdomen when she lies flat. No sob at any other times. Bladder habits unchanged  ROS: Patient denies fever, rash, sore throat, blurred vision, nausea, vomiting, diarrhea, cough, shortness of breath or chest pain, joint or back pain, headache, or mood change.    Objective: Vital Signs: Blood pressure 106/63, pulse 69, temperature 98.3 F (36.8 C), temperature source Oral, resp. rate 18, height 5\' 11"  (1.803 m), weight 103.2 kg (227 lb 9.8 oz), last menstrual period 10/22/2016, SpO2 100 %. No results found. Results for orders placed or performed during the hospital encounter of 09/21/17 (from the past 72 hour(s))  Glucose, capillary     Status: None   Collection Time: 10/20/17 10:19 AM  Result Value Ref Range   Glucose-Capillary 87 70 - 99 mg/dL   *Note: Due to a large number of results and/or encounters for the requested time period, some results have not been displayed. A complete set of results can be found in Results Review.     Constitutional: No distress . Vital signs reviewed. HEENT: EOMI, oral membranes moist Neck: supple Cardiovascular: RRR without murmur. No JVD    Respiratory: CTA Bilaterally without wheezes or rales. Normal effort    GI: BS +, non-tender, non-distended  Neuro: alert and oriented  Motor: bilateral upper extremity 5/5 proximal distal Bilateral lower extremities 0/5 proximal to distal, sensory level at lower sternum Musc/Skel:  No edema or tenderness in extremities Psych:pleasant Skin: warm and dry. Intact.   Assessment/Plan: 1. Functional deficits secondary to T3 paraplegia which require 3+ hours per day of interdisciplinary therapy in a comprehensive inpatient rehab setting. Physiatrist is providing close team supervision and 24 hour management of active medical problems listed below. Physiatrist and rehab team continue to assess barriers to discharge/monitor patient progress toward  functional and medical goals. FIM: Function - Bathing Position: Wheelchair/chair at sink Body parts bathed by patient: Right arm, Left arm, Chest, Abdomen(UB only) Body parts bathed by helper: Back Bathing not applicable: Front perineal area, Buttocks Assist Level: Set up, Supervision or verbal cues Assistive Device Comment: LH sponge and reacher Set up : To obtain items  Function- Upper Body Dressing/Undressing What is the patient wearing?: Pull over shirt/dress, Bra Bra - Perfomed by patient: Thread/unthread right bra strap, Thread/unthread left bra strap, Hook/unhook bra (pull down sports bra) Bra - Perfomed by helper: Hook/unhook bra (pull down sports bra) Pull over shirt/dress - Perfomed by patient: Thread/unthread right sleeve, Thread/unthread left sleeve, Put head through opening, Pull shirt over trunk Pull over shirt/dress - Perfomed by helper: Pull shirt over trunk Assist Level: Set up, Supervision or verbal cues Set up : To obtain clothing/put away Function - Lower Body Dressing/Undressing What is the patient wearing?: Pants, Maryln Manuel, Shoes Position: Bed Pants- Performed by patient: Thread/unthread right pants leg, Thread/unthread left pants leg, Pull pants up/down Pants- Performed by helper: Pull pants up/down Shoes - Performed by helper: Don/doff right shoe, Don/doff left shoe, Fasten right, Fasten left TED Hose - Performed by helper: Don/doff right TED hose, Don/doff left TED hose Assist for footwear: Dependant Assist for lower body dressing: Touching or steadying assistance (Pt > 75%)  Function - Toileting Toileting activity did not occur: No continent bowel/bladder event Toileting steps completed by patient: Adjust clothing prior to toileting, Performs perineal hygiene, Adjust clothing after toileting Toileting steps completed by helper: Adjust clothing prior to toileting, Performs perineal hygiene, Adjust clothing after toileting Assist level: Two helpers  Function  - Personnel officer  Toilet transfer activity did not occur: Safety/medical concerns Toilet transfer assistive device: Bedside commode Assist level to toilet: 2 helpers(per Wyvonnia Lora, NT) Assist level from toilet: 2 helpers Assist level to bedside commode (at bedside): Maximal assist (Pt 25 - 49%/lift and lower) Assist level from bedside commode (at bedside): 2 helpers  Function - Chair/bed transfer Chair/bed transfer activity did not occur: N/A Chair/bed transfer method: Lateral scoot Chair/bed transfer assist level: Moderate assist (Pt 50 - 74%/lift or lower)(for even level transfers) Chair/bed transfer assistive device: Armrests Chair/bed transfer details: Manual facilitation for placement, Manual facilitation for weight shifting, Tactile cues for sequencing, Tactile cues for weight shifting, Verbal cues for safe use of DME/AE  Function - Locomotion: Wheelchair Will patient use wheelchair at discharge?: Yes Type: Manual Max wheelchair distance: 400' Assist Level: No help, No cues, assistive device, takes more than reasonable amount of time Wheel 50 feet with 2 turns activity did not occur: Safety/medical concerns Assist Level: No help, No cues, assistive device, takes more than reasonable amount of time Wheel 150 feet activity did not occur: Safety/medical concerns Assist Level: No help, No cues, assistive device, takes more than reasonable amount of time Turns around,maneuvers to table,bed, and toilet,negotiates 3% grade,maneuvers on rugs and over doorsills: Yes Function - Locomotion: Ambulation Ambulation activity did not occur: Safety/medical concerns Walk 10 feet activity did not occur: Safety/medical concerns Walk 50 feet with 2 turns activity did not occur: Safety/medical concerns Walk 150 feet activity did not occur: Safety/medical concerns Walk 10 feet on uneven surfaces activity did not occur: Safety/medical concerns  Function - Comprehension Comprehension:  Auditory Comprehension assist level: Follows complex conversation/direction with no assist  Function - Expression Expression: Verbal Expression assist level: Expresses complex ideas: With no assist  Function - Social Interaction Social Interaction assist level: Interacts appropriately with others - No medications needed.  Function - Problem Solving Problem solving assist level: Solves complex problems: Recognizes & self-corrects  Function - Memory Memory assist level: Complete Independence: No helper Patient normally able to recall (first 3 days only): Current season, Location of own room, Staff names and faces, That he or she is in a hospital   Medical Problem List and Plan: 1.  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  Cont CIR  -SNF pending  -discussed the fact that her spasms/sensory level will contribute to decreased ability to cough, "band  Like" sensations over trunk. Encouraged her to find position where she feels more comfortable. Team can review coughing techniques with her also.  -Rad-Onc team saw patient today (no note) 2.  DVT Prophylaxis/Anticoagulation/history of pulmonary emboli: Xarelto.  Monitor for any bleeding episodes 3. Pain Management/spasticity:   -pt with lethargy and nausea better with reduction of baclofen to 10mg  QID (however she used this and higher doses before)   -continue tramadol for more severe pain  -continue tylenol prn  -continue tizanidine to  help tone (watch carefully for tolerance) 4. Mood: Zoloft 25 mg daily.  Provide emotional support 5. Neuropsych: This patient is capable of making decisions on her own behalf. 6. Skin/Wound Care: Routine skin checks, Left knee incision well healed 7. Fluids/Electrolytes/Nutrition:   BMP within acceptable range on 10/22 8.  Left femoral shaft fracture.  Status post intramedullary retrograde nailing  09/11/2017.  WBAT per ortho 9.  Acute blood loss anemia.    Hemoglobin 7.4 on 7/29---recheck tomorrow    10.  Hyperglycemia secondary to  steroid.  improved  -dc'ed cbg checks and SSI 11.  Neurogenic bowel/ bladder.    - PM bowel program with some improvement  -discuss ditropan adjustment with patient  -100k GNR   bactrim DS completed    12.  Hypothyroidism.  Synthroid 13. Thrombocytopenia -Resolved 14.  Sinus symptoms, flonase, added ocean nasal spray  claritin 15.  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

## 2017-10-22 NOTE — Progress Notes (Signed)
Incontinent BM during night. At 2145 bladder scan=653ml, I & O cath=588ml. At 0600, bladder scan = 371cc's, I & O cath=425cc's. Sarah [name] A

## 2017-10-22 NOTE — Progress Notes (Signed)
Physical Therapy Session Note  Patient Details  Name: ANNALIS KACZMARCZYK MRN: [id number] Date of Birth: [birth year]  Today's Date: 10/22/2017 PT Individual Time: 0900-1000 PT Individual Time Calculation (min): 60 min   Short Term Goals: Week 4:  PT Short Term Goal 1 (Week 4): LTG=STG due to ELOS  Skilled Therapeutic Interventions/Progress Updates:   Pt received supine in bed and agreeable to PT. Pt states that WC is broken. PT notes that R wheel had detached from chair, and would not fully re-connect. PT adjusted wheel to allow improved safety with WC mobility and transfers. PT assisted pt with lower body dressing task to don thigh high teds, pants and shoes. Supine>sit transfer with moderate assist from PT. SB transfer to [facility] with mod-max assist from PT with multimodal cues for LE placement and tone management. WC mobility in hall x 153f without assist from PT. [facility] and all needs met.        Therapy Documentation Precautions:  Precautions Precautions: Fall Precaution Comments: throracic region, LEs spasicity  Restrictions Weight Bearing Restrictions: Yes LLE Weight Bearing: Touchdown weight bearing Other Position/Activity Restrictions: B LEs(Order updated 10/14/17)    Pain: Pain Assessment Pain Scale: 0-10 Pain Score: 3  Pain Type: Acute pain Pain Location: Back Pain Orientation: Lower Pain Descriptors / Indicators: Aching Pain Onset: On-going Patients Stated Pain Goal: 2 Pain Intervention(s): Medication (See eMAR);Repositioned Multiple Pain Sites: No   See Function Navigator for Current Functional Status.   Therapy/Group: Individual Therapy  ALorie Phenix8/03/2017, 11:20 AM

## 2017-10-22 NOTE — Telephone Encounter (Signed)
I spoke with Sarah [name] from the [facility] radiology department. A time has been held for Sarah [name] starting at 10:00am, on Monday 8/5, for her Thoracic Myelogram to be done, followed by a CT scan for treatment planning. Arrangements have been made to hold the contraindicated medications for 48 hours and an order placed by the inpatient unit PA-C, Dan.    Following the myelogram, Sarah [name] will travel via [facility] to the [facility] department at 12:30pm for her simulation procedure. Lennart Pall LCSW, inpatient case manager, is assisting in the set up of [facility] travel and clearing Sarah [name]'s rehab schedule on Monday 8/5 in order for these procedures to be completed.    Mont Dutton R.T.(R)(T) [facility]   Radiation Oncology  Radiation Special Procedures Navigator Office: [phone]   Pager: [phone]  [facility] Website: [email or web address]

## 2017-10-22 NOTE — Consult Note (Addendum)
Radiation Oncology         ([phone]) [phone] ________________________________  Name: Sarah [name]        MRN: [id number]  Date of Service: 09/03/17 DOB: 09/27/1965  HE:RDEYCX, Earna Coder, MD  No ref. provider found     REFERRING PHYSICIAN: No ref. 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 Diagnoses of Fall, initial encounter, Acute pain of right shoulder, Neck pain, Leg weakness, bilateral, Chronic anemia, Essential hypertension, Lower urinary tract infectious disease, and Metastasis to spinal column [facility]) were also pertinent to this visit.   HISTORY OF PRESENT ILLNESS: Sarah [name] is a 52 y.o. female seen at the request of Dr. Christella Noa for a history of metastatic thyroid cancer. She was originally diagnosed in 2015 when she had a mass in the T3 vertebral body causing cord compression and a 7.2 x 5.2 x 11.2 cm mass in the left thyroid. She was decompressed surgically with Dr. Christella Noa on 12/14/13 and final pathology revealed carcinoma most consistent with thyroid primary. 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 She went on to receive 30 Gy to C7-T6 with Dr. [name] which she completed on 01/31/14. The patient made a tremendous recover and regained her ability to walk about 6 months after surgery. Her course was prolonged but she has had an excellent quality of life as a result. Unfortunately she was found to have recurrent disease at T3 vertebral body in the spring of 2017 when she presented with cord compression again. There was discussion of re-irradiation versus surgical decompression and patient elected to proceed with the latter which she underwent with Dr. Christella Noa on 07/05/15. She again had a remarkable recovery and regained use of her legs, and her continence. She had been able to walk with a walker, and stand up completely unassisted until late May 2019  when she started noticing increasing lower extremity weakness. She had an appointment on 08/24/17 with [name] and she was offered admission to work up her symptoms versus outpatient MRI. She elected for outpatient MRI, but on 09/02/17, she had an episode of progressive weakness and fell. She was brought by EMS to the hospital overnight and CT of the head and C spine were negative for acute changes. Her MRI of the thoracic and lumbar spine on 09/02/17 revealed concerns for interval worsening of the T3 lesion with worsened mass effect on the thecal sac and cord compression. There was stable changes from prior instrumentation, and in the lumbar spine no metastatic changes. She was seen by Dr. Ronnald Ramp and he did not feel that she would benefit overnight from decompression, but wanted to discuss her case with Dr. Christella Noa and requested radiation oncology input to consider re-irradiation. After further discussion and consideration, the decision was to move forward with repeat resection/debulking of the recurrent tumor at T3 which was performed by Dr. Christella Noa on 09/03/17.  She was transferred to inpatient rehab on 09/08/17 and has continued to work to regain strength and function.  She complained of persistent left knee pain during her rehab stay, related to a fall at home prior to admission,  which prompted left hip/femur radiographs which demonstrated a distal left femoral shaft fracture which was treated with placement of IM Nail by Dr. Marcelino Scot on 09/11/17.  She tolerated the procedure well and was able to transfer back to IP rehab services on 09/14/17 where she has remained since that time  and continued to make slow but gradual progress with rehab.  At this point, she has not regained any controlled motor function in the LEs and has minimal sensation to light touch in the LEs related to there recurrent T3 disease. She continues with bowel/bladder incontinence intermittently but seems to be improving and her pain has remained well  controlled throughout her admission.  We are asked to consult today to discuss the role of re-irradiation to the residual metastatic tumor at T3 s/p recent re-resection on 09/03/17.  PREVIOUS RADIATION THERAPY: Yes   01/18/14-01/31/14: 30 Gy in 10 fractions from C7-T6  Radioactive Iodine in 2015 though she did not respond to this therapy.  PAST MEDICAL HISTORY:  Past Medical History:  Diagnosis Date  . Cancer [facility])    FNA of thyroid positive for onconytic/hurthle cell carcinoma  . Chronic back pain   . DDD (degenerative disc disease), cervical   . DJD (degenerative joint disease)   . History of rectal fissure   . HIT (heparin-induced thrombocytopenia) ([place])   . Hypertension   . Obesity   . Paraplegia at T4 level ([place])   . Pulmonary embolus, right ([place]) 2015  . Rotator cuff tendonitis right       PAST SURGICAL HISTORY: Past Surgical History:  Procedure Laterality Date  . LAMINECTOMY N/A 12/14/2013   Procedure: THORACIC LAMINECTOMY FOR TUMOR THORACIC THREE;  Surgeon: Ashok Pall, MD;  Location: [place] NEURO ORS;  Service: Neurosurgery;  Laterality: N/A;  THORACIC LAMINECTOMY FOR TUMOR THORACIC THREE  . LAMINECTOMY N/A 07/05/2015   Procedure: THORACIC LAMINECTOMY FOR TUMOR;  Surgeon: Ashok Pall, MD;  Location: [place] NEUR[facility] ORS;  Service: Neurosurgery;  Laterality: N/A;  . POSTERIOR LUMBAR FUSION 4 LEVEL N/A 12/30/2013   Procedure: Thoracic one-Thoracic five posterior thoracic fusion with pedicle screws;  Surgeon: Ashok Pall, MD;  Location: [place] NEURO ORS;  Service: Neurosurgery;  Laterality: N/A;  Thoracic one-Thoracic five posterior thoracic fusion with pedicle screws  . THYROIDECTOMY N/A 01/09/2014   Procedure: TOTAL THYROIDECTOMY;  Surgeon: Izora Gala, MD;  Location: [place];  Service: ENT;  Laterality: N/A;  . TONSILLECTOMY       FAMILY HISTORY:  Family History  Problem Relation Age of Onset  . Hypertension Mother   . Diabetes Mother   . Hypertension Father   . Stroke Father     . Diabetes Father   . Diabetes Sister   . Diabetes Sister      SOCIAL HISTORY:  reports that she has never smoked. She has never used smokeless tobacco. She reports that she does not drink alcohol or use drugs. The patient is single and lives in [place]. She is accompanied by her mother and her sister.   ALLERGIES: Bee venom; Keflex [cephalexin]; Tramadol; Gabapentin; Heparin; Hydrochlorothiazide; Hydrocodone; and Oxycodone-acetaminophen   MEDICATIONS:  Current Facility-Administered Medications  Medication Dose Route Frequency Provider Last Rate Last Dose  . baclofen (LIORESAL) tablet 20 mg  20 mg Oral TID Emokpae, Ejiroghene E, MD   20 mg at 09/03/17 0101  . cefTRIAXone (ROCEPHIN) 2 g in sodium chloride 0.9 % 100 mL IVPB  2 g Intravenous QHS Emokpae, Ejiroghene E, MD   Stopped at 09/03/17 0150  . dexamethasone (DECADRON) injection 4 mg  4 mg Intravenous Q6H Emokpae, Ejiroghene E, MD   4 mg at 09/03/17 0517  . fluticasone (FLONASE) 50 MCG/ACT nasal spray 2 spray  2 spray Each Nare Daily Emokpae, Ejiroghene E, MD      . fondaparinux (ARIXTRA) injection 7.5 mg  7.5 mg Subcutaneous Q0600 Emokpae, Ejiroghene E, MD   7.5 mg at 09/03/17 0110  . HYDROcodone-acetaminophen (NORCO/VICODIN) 5-325 MG per tablet 1-2 tablet  1-2 tablet Oral Q4H PRN Emokpae, Ejiroghene E, MD   1 tablet at 09/03/17 0404  . levothyroxine (SYNTHROID, LEVOTHROID) tablet 137 mcg  137 mcg Oral QAC breakfast Emokpae, Ejiroghene E, MD      . lisinopril (PRINIVIL,ZESTRIL) tablet 40 mg  40 mg Oral Daily Emokpae, Ejiroghene E, MD   40 mg at 09/03/17 0055  . NIFEdipine (PROCARDIA-XL/ADALAT-CC/NIFEDICAL-XL) 24 hr tablet 30 mg  30 mg Oral Daily Emokpae, Ejiroghene E, MD      . ondansetron (ZOFRAN) tablet 4 mg  4 mg Oral Q6H PRN Emokpae, Ejiroghene E, MD       Or  . ondansetron (ZOFRAN) injection 4 mg  4 mg Intravenous Q6H PRN Emokpae, Ejiroghene E, MD      . pantoprazole (PROTONIX) EC tablet 40 mg  40 mg Oral Daily Emokpae,  Ejiroghene E, MD      . senna (SENOKOT) tablet 17.2 mg  2 tablet Oral Daily Emokpae, Ejiroghene E, MD      . sertraline (ZOLOFT) tablet 25 mg  25 mg Oral Daily Emokpae, Ejiroghene E, MD      . traZODone (DESYREL) tablet 50 mg  50 mg Oral QHS PRN Emokpae, Ejiroghene E, MD   50 mg at 09/03/17 0101     REVIEW OF SYSTEMS: On review of systems, the patient reports that she is doing okay but has been concerned that she has not been able to move her lower extremities much at all. She is not currently on any steroids. She denies any chest pain, shortness of breath, cough, fevers, chills, night sweats, or recent unintended weight changes. She does have intermittent upper abdominal pressure with lying flat.  She continues with occasional bowel and bladder incontinence.  She denies nausea or vomiting. She denies any new musculoskeletal or joint aches or pains. A complete review of systems is obtained and is otherwise negative.  PHYSICAL EXAM:  Wt Readings from Last 3 Encounters:  09/02/17 218 lb 4.1 oz (99 kg)  08/09/17 211 lb 10.3 oz (96 kg)  05/28/17 208 lb (94.3 kg)   Temp Readings from Last 3 Encounters:  09/03/17 99 F (37.2 C) (Oral)  08/24/17 98.6 F (37 C) (Oral)  08/21/17 98 F (36.7 C) (Oral)   BP Readings from Last 3 Encounters:  09/03/17 99/63  08/24/17 118/90  08/21/17 98/78   Pulse Readings from Last 3 Encounters:  09/03/17 92  08/24/17 93  08/21/17 89   Pain Assessment Pain Score: 2 /10  In general this is a well appearing caucasian female in no acute distress. She is alert and oriented x4 and appropriate throughout the examination. HEENT reveals that the patient is normocephalic, atraumatic. EOMs are intact. PERRLA. Skin is intact without any evidence of gross lesions of her upper extremities. She has several eschars of her lower extremities bilaterally. Cardiopulmonary assessment is negative for acute distress and she exhibits normal effort. Lower extremities are negative  for deep calf tenderness, cyanosis or clubbing, but she is unable to detect sensation to light touch over bilateral lower extremities, and is unable to lift her legs.   ECOG = 3  0 - Asymptomatic (Fully active, able to carry on all predisease activities without restriction)  1 - Symptomatic but completely ambulatory (Restricted in physically strenuous activity but ambulatory and able to carry out work of a light or sedentary  nature. For example, light housework, office work)  2 - Symptomatic, <50% in bed during the day (Ambulatory and capable of all self care but unable to carry out any work activities. Up and about more than 50% of waking hours)  3 - Symptomatic, >50% in bed, but not bedbound (Capable of only limited self-care, confined to bed or chair 50% or more of waking hours)  4 - Bedbound (Completely disabled. Cannot carry on any self-care. Totally confined to bed or chair)  5 - Death   Eustace Pen MM, Creech RH, Tormey DC, et al. [phone]). "Toxicity and response criteria of the [facility] Group". [place] Oncol. 5 (6): 649-55    LABORATORY DATA:  Lab Results  Component Value Date   WBC 5.8 09/02/2017   HGB 10.9 (L) 09/02/2017   HCT 34.0 (L) 09/02/2017   MCV 94.7 09/02/2017   PLT 190 09/02/2017   Lab Results  Component Value Date   NA 141 09/02/2017   K 4.0 09/02/2017   CL 105 09/02/2017   CO2 27 09/02/2017   Lab Results  Component Value Date   ALT 64 (H) 08/24/2017   AST 39 (H) 08/24/2017   ALKPHOS 328 (H) 08/24/2017   BILITOT 0.6 08/24/2017      RADIOGRAPHY: Ct Abdomen Pelvis Wo Contrast  Result Date: 08/09/2017 CLINICAL DATA:  Per ED notes: Pt presents today for nausea x 2 days. Took zofran at home with no relief. Pt denies diarrhea. Pt take oral chemo for thyroid CA with mets. Pt denies being around anyone with similar symptoms. EXAM: CT ABDOMEN AND PELVIS WITHOUT CONTRAST TECHNIQUE: Multidetector CT imaging of the abdomen and pelvis was  performed following the standard protocol without IV contrast. COMPARISON:  PET-CT 05/20/2017 and previous FINDINGS: Lower chest: Lower lobe pulmonary nodules up to 5 mm, stable since previous. No pleural or pericardial effusion. Hepatobiliary: Gallbladder is distended with small partially calcified stones in its dependent aspect. Unremarkable liver. Pancreas: Unremarkable. No pancreatic ductal dilatation or surrounding inflammatory changes. Spleen: Normal in size without focal abnormality. Adrenals/Urinary Tract: Adrenal glands are unremarkable. Kidneys are normal, without renal calculi, focal lesion, or hydronephrosis. Bladder is unremarkable. Stomach/Bowel: Stomach is nondistended. Small bowel is nondilated. Colon is nondilated with a few scattered diverticula. Vascular/Lymphatic: No significant vascular findings are present. No enlarged abdominal or pelvic lymph nodes. Reproductive: Uterus and bilateral adnexa are unremarkable. Other: No ascites.  No free air. Musculoskeletal: Spondylitic changes throughout the lumbar spine. Bilateral hip degenerative spurring. No fracture or worrisome bone lesion. IMPRESSION: 1. No acute findings. 2. Stable small pulmonary nodules at the visualized lung bases. Electronically Signed   By: Lucrezia Europe M.D.   On: 08/09/2017 13:54   Dg Shoulder Right  Result Date: 09/02/2017 CLINICAL DATA:  Pain following fall EXAM: RIGHT SHOULDER - 2+ VIEW COMPARISON:  None. FINDINGS: Frontal and Y scapular images were obtained. There is no appreciable fracture or dislocation. There is moderate generalized osteoarthritic change. No erosive change or intra-articular calcification. Visualized right lung is clear. Postoperative changes present in the upper thoracic region. 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 No fracture or dislocation. Electronically Signed   By: Lowella Grip III M.D.   On: 09/02/2017 14:04   Ct Head Wo Contrast  Result Date:  09/02/2017 CLINICAL DATA:  Status post fall moving from a bed to a wheelchair today. Initial encounter. EXAM: CT HEAD WITHOUT CONTRAST CT CERVICAL SPINE WITHOUT CONTRAST TECHNIQUE: Multidetector CT imaging of the head and cervical  spine was performed following the standard protocol without intravenous contrast. Multiplanar CT image reconstructions of the cervical spine were also generated. COMPARISON:  Head CT scan 12/22/2016.  PET CT scan 05/20/2017. FINDINGS: CT HEAD FINDINGS Brain: No evidence of acute infarction, hemorrhage, hydrocephalus, extra-axial collection or mass lesion/mass effect. Vascular: A few atherosclerotic calcifications are seen. Skull: Intact.  No focal lesion. Sinuses/Orbits: Mild mucosal thickening is noted in the maxillary sinuses, frontal sinuses, sphenoid sinus and scattered ethmoid air cells. Other: None. CT CERVICAL SPINE FINDINGS Alignment: Maintained. Skull base and vertebrae: No acute fracture. No primary bone lesion or focal pathologic process. Soft tissues and spinal canal: No prevertebral fluid or swelling. No visible canal hematoma. Disc levels: Intervertebral disc space height is maintained. There is some endplate spurring most notable at C3-4 and C5-6. Partial visualization of thoracic fusion hardware noted. Upper chest: 0.5 cm left upper lobe pulmonary nodule is seen on prior PET CT scan is noted. Other: None. IMPRESSION: No acute abnormality head or cervical spine. Atherosclerosis. Sinus disease. Mild appearing cervical spondylosis. No change in 0.5 cm left upper lobe pulmonary nodule consistent with the patient's known metastatic thyroid carcinoma. Electronically Signed   By: Inge Rise M.D.   On: 09/02/2017 14:37   Ct Cervical Spine Wo Contrast  Result Date: 09/02/2017 CLINICAL DATA:  Status post fall moving from a bed to a wheelchair today. Initial encounter. EXAM: CT HEAD WITHOUT CONTRAST CT CERVICAL SPINE WITHOUT CONTRAST TECHNIQUE: Multidetector CT imaging of  the head and cervical spine was performed following the standard protocol without intravenous contrast. Multiplanar CT image reconstructions of the cervical spine were also generated. COMPARISON:  Head CT scan 12/22/2016.  PET CT scan 05/20/2017. FINDINGS: CT HEAD FINDINGS Brain: No evidence of acute infarction, hemorrhage, hydrocephalus, extra-axial collection or mass lesion/mass effect. Vascular: A few atherosclerotic calcifications are seen. Skull: Intact.  No focal lesion. Sinuses/Orbits: Mild mucosal thickening is noted in the maxillary sinuses, frontal sinuses, sphenoid sinus and scattered ethmoid air cells. Other: None. CT CERVICAL SPINE FINDINGS Alignment: Maintained. Skull base and vertebrae: No acute fracture. No primary bone lesion or focal pathologic process. Soft tissues and spinal canal: No prevertebral fluid or swelling. No visible canal hematoma. Disc levels: Intervertebral disc space height is maintained. There is some endplate spurring most notable at C3-4 and C5-6. Partial visualization of thoracic fusion hardware noted. Upper chest: 0.5 cm left upper lobe pulmonary nodule is seen on prior PET CT scan is noted. Other: None. IMPRESSION: No acute abnormality head or cervical spine. Atherosclerosis. Sinus disease. Mild appearing cervical spondylosis. No change in 0.5 cm left upper lobe pulmonary nodule consistent with the patient's known metastatic thyroid carcinoma. Electronically Signed   By: Inge Rise M.D.   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 T3 metastasis with history of prior decompression. EXAM: MRI THORACIC AND LUMBAR SPINE WITHOUT AND WITH CONTRAST TECHNIQUE: Multiplanar and multiecho pulse sequences of the thoracic and lumbar spine were obtained without and with intravenous contrast. CONTRAST:   14mL MULTIHANCE GADOBENATE DIMEGLUMINE 529 MG/ML IV SOLN COMPARISON:  Previous MRI from 12/25/2015. FINDINGS: MRI THORACIC SPINE FINDINGS Alignment: Examination mildly limited as the patient was unable to tolerate the full length of the exam. Postcontrast axial sequences as well as post-contrast imaging through the lumbar spine was not performed. Stable alignment with preservation of the normal  thoracic kyphosis. No listhesis. Vertebrae: Postoperative changes from prior fusion from T1 through T5 for T3 decompression again seen. Pathologic fracture deformity of T3 is relatively unchanged in appearance. Interval growth with progressive tumor seen within the adjacent spinal canal, extending from the mid aspect of T2 through the mid aspect of T4 (series 7, image 8). There has been marked interval worsening of cord and thecal sac progression, with the sac compressed laterally and slit like in appearance, measuring approximately 2 mm in transverse diameter at its most narrow point (series 10, image 12). Suspected cord signal abnormality at this level, although poorly visualized due to susceptibility artifact from adjacent hardware. Postoperative fluid collection again noted at the adjacent laminectomy site, likely slightly decreased in size. No other metastatic lesions identified. Reactive marrow edema and enhancement about the right T12-L1 facet favored to be degenerative in nature (series 6, image 4). Vertebral body heights otherwise maintained. Mild discogenic reactive endplate changes present at the thoracolumbar junction. Cord:  No other cord signal abnormality identified. Paraspinal and other soft tissues: No other acute paraspinous soft tissue abnormality. Probable mild cholelithiasis noted. Visualized visceral structures otherwise unremarkable. Disc levels: T7-8: Central disc extrusion flattens the ventral thoracic spinal cord and resultant moderate spinal stenosis, stable. Left paracentral disc protrusion at T8-9  with mild flattening of the left hemi cord also unchanged. MRI LUMBAR SPINE FINDINGS Segmentation: Normal segmentation. Lowest well-formed disc labeled the L5-S1 level. Alignment: Levoscoliosis. Vertebral bodies otherwise normally aligned with preservation of the normal lumbar lordosis. No listhesis. Vertebrae: Mild height loss with reactive endplate edema at the left aspect of the superior endplate of L4, suspicious for mild compression fracture (series 14, image 12). No bony retropulsion. This is benign in appearance, with no underlying lesion identified. No worrisome osseous lesions or evidence for metastatic disease identified within the lumbar spine. Prominent reactive marrow edema about the left L3-4 facet due to advanced facet degeneration. Conus medullaris: Extends to the L1 level and appears normal. Paraspinal and other soft tissues: Paraspinous soft tissues within normal limits. Visualized visceral structures unremarkable. Disc levels: L1-2:  Mild facet and ligament flavum hypertrophy.  No stenosis. L2-3: Minimal disc bulge with bilateral facet hypertrophy. No significant stenosis. L3-4: Disc desiccation with minimal disc bulge. Prominent anterior bridging osteophyte. Advanced bilateral facet degeneration with associated reactive marrow edema, left greater than right. 7 mm synovial cyst noted at the posterior aspect of the right L3-4 facet. Mild canal with bilateral L3 foraminal stenosis. L4-5: Chronic intervertebral disc mild diffuse disc bulge. Moderate to advanced facet arthrosis, left greater than right. Resultant moderate canal with bilateral subarticular stenosis. Foramina remain patent. L5-S1: Chronic intervertebral disc space narrowing with diffuse disc bulge. Left greater than right facet degeneration. Resultant moderate to severe left lateral recess narrowing. Mild right with mild to moderate left L5 foraminal stenosis. IMPRESSION: MRI THORACIC SPINE IMPRESSION 1. Interval worsening in metastatic  lesion centered at T3 with worsened mass effect on the thecal sac and evidence for cord compression. Neuro surgical consultation recommended. 2. Superimposed sequelae of prior T1 through T5 fusion for T3 decompression. 3. Stable disc protrusions at T7-8 and T8-9. 4. Prominent facet arthropathy at T12-L1 on the right with associated reactive marrow edema. MRI LUMBAR SPINE IMPRESSION 1. No evidence for metastatic disease within the lumbar spine. 2. Mild acute to subacute endplate compression deformity involving the left aspect of the superior endplate of L4. This is benign in appearance with no underlying pathologic lesion. 3. Advanced bilateral facet degeneration at L3-4 with  associated reactive marrow edema, left greater than right. 4. Multilevel degenerative spondylolysis as above, most notable at L4-5 and L5-S1. Resultant moderate canal stenosis at L4-5, with moderate to severe left lateral recess narrowing at L5-S1. Critical Value/emergent results were called by telephone at the time of interpretation on 09/02/2017 at 8:30 pm to the ER PA [place] , who verbally acknowledged these results. Electronically Signed   By: Jeannine Boga M.D.   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 EXAM: MRI THORACIC AND LUMBAR SPINE WITHOUT AND WITH CONTRAST TECHNIQUE: Multiplanar and multiecho pulse sequences of the thoracic and lumbar spine were obtained without and with intravenous contrast. CONTRAST:  4mL MULTIHANCE GADOBENATE DIMEGLUMINE 529 MG/ML IV SOLN COMPARISON:  Previous MRI from 12/25/2015. FINDINGS: MRI THORACIC SPINE FINDINGS Alignment: Examination mildly limited as the patient was unable to tolerate the full length of the exam. Postcontrast axial sequences as well as  post-contrast imaging through the lumbar spine was not performed. Stable alignment with preservation of the normal thoracic kyphosis. No listhesis. Vertebrae: Postoperative changes from prior fusion from T1 through T5 for T3 decompression again seen. Pathologic fracture deformity of T3 is relatively unchanged in appearance. Interval growth with progressive tumor seen within the adjacent spinal canal, extending from the mid aspect of T2 through the mid aspect of T4 (series 7, image 8). There has been marked interval worsening of cord and thecal sac progression, with the sac compressed laterally and slit like in appearance, measuring approximately 2 mm in transverse diameter at its most narrow point (series 10, image 12). Suspected cord signal abnormality at this level, although poorly visualized due to susceptibility artifact from adjacent hardware. Postoperative fluid collection again noted at the adjacent laminectomy site, likely slightly decreased in size. No other metastatic lesions identified. Reactive marrow edema and enhancement about the right T12-L1 facet favored to be degenerative in nature (series 6, image 4). Vertebral body heights otherwise maintained. Mild discogenic reactive endplate changes present at the thoracolumbar junction. Cord:  No other cord signal abnormality identified. Paraspinal and other soft tissues: No other acute paraspinous soft tissue abnormality. Probable mild cholelithiasis noted. Visualized visceral structures otherwise unremarkable. Disc levels: T7-8: Central disc extrusion flattens the ventral thoracic spinal cord and resultant moderate spinal stenosis, stable. Left paracentral disc protrusion at T8-9 with mild flattening of the left hemi cord also unchanged. MRI LUMBAR SPINE FINDINGS Segmentation: Normal segmentation. Lowest well-formed disc labeled the L5-S1 level. Alignment: Levoscoliosis. Vertebral bodies otherwise normally aligned with preservation of the normal lumbar  lordosis. No listhesis. Vertebrae: Mild height loss with reactive endplate edema at the left aspect of the superior endplate of L4, suspicious for mild compression fracture (series 14, image 12). No bony retropulsion. This is benign in appearance, with no underlying lesion identified. No worrisome osseous lesions or evidence for metastatic disease identified within the lumbar spine. Prominent reactive marrow edema about the left L3-4 facet due to advanced facet degeneration. Conus medullaris: Extends to the L1 level and appears normal. Paraspinal and other soft tissues: Paraspinous soft tissues within normal limits. Visualized visceral structures unremarkable. Disc levels: L1-2:  Mild facet and ligament flavum hypertrophy.  No stenosis. L2-3: Minimal disc bulge with bilateral facet hypertrophy. No significant stenosis. L3-4: Disc desiccation with minimal disc bulge. Prominent anterior bridging osteophyte. Advanced bilateral  facet degeneration with associated reactive marrow edema, left greater than right. 7 mm synovial cyst noted at the posterior aspect of the right L3-4 facet. Mild canal with bilateral L3 foraminal stenosis. L4-5: Chronic intervertebral disc mild diffuse disc bulge. Moderate to advanced facet arthrosis, left greater than right. Resultant moderate canal with bilateral subarticular stenosis. Foramina remain patent. L5-S1: Chronic intervertebral disc space narrowing with diffuse disc bulge. Left greater than right facet degeneration. Resultant moderate to severe left lateral recess narrowing. Mild right with mild to moderate left L5 foraminal stenosis. IMPRESSION: MRI THORACIC SPINE IMPRESSION 1. 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 Neuro surgical consultation recommended. 2. Superimposed sequelae of prior T1 through T5 fusion for T3 decompression. 3. Stable disc protrusions at T7-8 and T8-9. 4. Prominent  facet arthropathy at T12-L1 on the right with associated reactive marrow edema. MRI LUMBAR SPINE IMPRESSION 1. No evidence for metastatic disease within the lumbar spine. 2. Mild acute to subacute endplate compression deformity involving the left aspect of the superior endplate of L4. This is benign in appearance with no underlying pathologic lesion. 3. Advanced bilateral facet degeneration at L3-4 with associated reactive marrow edema, left greater than right. 4. Multilevel degenerative spondylolysis as above, most notable at L4-5 and L5-S1. Resultant moderate canal stenosis at L4-5, with moderate to severe left lateral recess narrowing at L5-S1. Critical Value/emergent results were called by telephone at the time of interpretation on 09/02/2017 at 8:30 pm to the ER PA [employer] , who verbally acknowledged these results. Electronically Signed   By: Jeannine Boga M.D.   On: 09/02/2017 20:31   US Abdomen Complete  Result Date: 08/10/2017 CLINICAL DATA:  Elevated bilirubin level. EXAM: ABDOMEN ULTRASOUND COMPLETE COMPARISON:  CT scan of Aug 09, 2017. FINDINGS: Gallbladder: Multiple gallstones are noted with the largest measuring 1.1 cm. No gallbladder wall thickening or pericholecystic fluid is noted. No sonographic Murphy's sign is noted. Common bile duct: Diameter: 2.8 mm which is within normal limits. Liver: No focal lesion identified. Within normal limits in parenchymal echogenicity. Portal vein is patent on color Doppler imaging with normal direction of blood flow towards the liver. IVC: No abnormality visualized. Pancreas: Not well visualized due to overlying bowel gas. Spleen: Size and appearance within normal limits. Right Kidney: Length: 11 cm. Echogenicity within normal limits. No mass or hydronephrosis visualized. Left Kidney: Length: 10.2 cm. Echogenicity within normal limits. No mass or hydronephrosis visualized. Abdominal aorta: No aneurysm visualized. Other findings: None. IMPRESSION:  Cholelithiasis without inflammation. Pancreas not well visualized due to overlying bowel gas. No other abnormality seen in the abdomen. Electronically Signed   By: Marijo Conception, M.D.   On: 08/10/2017 14:32   Nm Hepato W/eject Fract  Result Date: 08/11/2017 CLINICAL DATA:  RIGHT upper quadrant pain for few days EXAM: NUCLEAR MEDICINE HEPATOBILIARY IMAGING WITH GALLBLADDER EF TECHNIQUE: Sequential images of the abdomen were obtained out to 60 minutes following intravenous administration of radiopharmaceutical. After slow intravenous infusion of 1.91 micrograms Cholecystokinin, gallbladder ejection fraction was determined. RADIOPHARMACEUTICALS:  5.5 mCi Tc-53m Choletec IV COMPARISON:  Ultrasound abdomen 08/10/2017 FINDINGS: Normal tracer extraction from bloodstream indicating normal hepatocellular function. Normal excretion of tracer into biliary tree. Gallbladder visualized at 25 min, though no additional tracer is seen within the gallbladder until 58 minutes. Minimal tracer within gallbladder prior to CCK administration. Small bowel visualized at 12 min. No hepatic retention of tracer. Subjectively very little to  no emptying of tracer from gallbladder following CCK administration. Calculated gallbladder ejection fraction is likely inaccurate due to superimposed small bowel loops with the gallbladder near the conclusion of the post CCK images. Patient reported no symptoms following CCK administration. Normal gallbladder ejection fraction following CCK stimulation is greater than 40% at 1 hour. IMPRESSION: Patent biliary tree. Suboptimal filling of gallbladder prior to CCK with limited assessment of gallbladder emptying post CCK due to superimposed bowel and poor filling, unable to generate an accurate gallbladder ejection fraction but subjectively there is very poor/little emptying following CCK stimulation. Electronically Signed   By: Lavonia Dana M.D.   On: 08/11/2017 13:13        IMPRESSION/PLAN: 1. 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  The patient's case has been reviewed and discussed at recent multidisciplinary spine conference. She is s/p recent repeat tumor resection at T3 for debulking on 09/03/17 and is progressing well with inpatient rehab. Consensus recommendation at recent [facility] is to proceed with fractionated SRS to T3 to further treat residual tumor seen on follow up CT imaging from 09/09/17.  We discussed the risks, benefits, short, and long term effects of radiotherapy in the setting of re-irradiation, and she is willing to accept those risks and would like to proceed with radiation treatment planning.  She will need a thoracic myelogram prior to/same day as CT SIM so we will make the appropriate arrangements for these procedures in hopes that these can be completed prior to her discharge to SNF.  She is currently scheduled for CT myelogram at 10am on Monday 10/26/17 followed by CT SIM at 1pm. We will make appropriate arrangements for patient to be transported from [facility] to [facility] radiation oncology department for [facility]. She will need to hold all blood thinners including xarelto, zoloft, ultram and trazadone for 48hrs prior to myelogram procedure.  This information will be communicated with her rehab team and assigned social worker in hopes that they will accommodate her continued stay through Monday 10/26/17 to allow for necessary treatment planning prior to discharge.    Nicholos Johns, PA-C    Tyler Pita, MD  [facility] Direct Dial: [phone]  Fax: [phone] [email or web address]  Skype  LinkedIn    Page Me

## 2017-10-22 NOTE — Progress Notes (Signed)
Physical Therapy Session Note  Patient Details  Name: Sarah [name] MRN: [id number] Date of Birth: [birth year]  Today's Date: 10/22/2017 PT Individual Time: 1300-1400 PT Individual Time Calculation (min): 60 min   Short Term Goals:   PT Short Term Goal 4 - Progress (Week 2): Progressing toward goal Week 3:  PT Short Term Goal 1 (Week 3): LTG=STG due to ELOS PT Short Term Goal 1 - Progress (Week 3): Progressing toward goal Week 4:  PT Short Term Goal 1 (Week 4): LTG=STG due to ELOS  Skilled Therapeutic Interventions/Progress Updates:   Pt seated in w/c, stating she is especially fatigued due to change in meds .  She stated that she has not practiced donning leg loops.  Slide board transfer w/c> bed to L with mod/max assist with foot stool under feet, mod assist to move into supine. PT doffed leg loops.  Pt donned leg loops using bed features and reacher, with min assist only for crossing ankles to reach ankle straps.  Pt attempted to move R/L LEs using leg loops to move laterally in bed.  Hypertonus bil hip adductors limits lateral LE movements.   Instructed pt in relaxation visualization and diaprhagmatic breating using quick sniffs to activate diaphragm, with good results for relaxation, but no effect on LE spasticity.  PT performed R/L heel cord stretching x 30 sec x 2.  Pt left resting in bed with needs at hand and alarm set.       Therapy Documentation Precautions:  Precautions Precautions: Fall Precaution Comments: throracic region, LEs spasicity  Restrictions Weight Bearing Restrictions: Yes LLE Weight Bearing: Touchdown weight bearing Other Position/Activity Restrictions: B LEs(Order updated 10/14/17)   Pain: pt denies     See Function Navigator for Current Functional Status.   Therapy/Group: Individual Therapy  Dahiana Kulak 10/22/2017, 3:37 PM

## 2017-10-22 NOTE — Progress Notes (Signed)
[name] scheduled for a CT myelogram 10 AM Monday, 10/26/2017.  Xarelto, Zoloft and Ultram are to be held 48 hours prior to procedure and orders have been entered.

## 2017-10-22 NOTE — Progress Notes (Signed)
Physical Therapy Session Note  Patient Details  Name: Sarah [name] MRN: [id number] Date of Birth: 1965/05/28  Today's Date: 10/22/2017 PT Individual Time: 1300-1400 PT Individual Time Calculation (min): 60 min   Short Term Goals: Week 4:  PT Short Term Goal 1 (Week 4): LTG=STG due to ELOS  Skilled Therapeutic Interventions/Progress Updates:    Pt reports no pain prior to start and propels w/c from room> outdoor using B UE's for >500 feet, Mod I, working on community w/c propulsion on uneven surfaces for endurance and strengthening. Pt performs seated UE strengthening exercises including 3x15 reps of bicep curls with 7# wt, 3x15 reps w/c push ups for tricep extension, shoulder flexion 3x10 reps with 4# wt, and rows for 3x15 reps with orange t-band and therapist providing manual resistance. Rest breaks in between sets. Pt propels w/c from outside>room Mod I for >500 feet and was turned to room with all needs met.    Therapy Documentation Precautions:  Precautions Precautions: Fall Precaution Comments: throracic region, LEs spasicity  Restrictions Weight Bearing Restrictions: Yes LLE Weight Bearing: Touchdown weight bearing Other Position/Activity Restrictions: B LEs(Order updated 10/14/17)   See Function Navigator for Current Functional Status.   Therapy/Group: Individual Therapy  Floreen Comber 10/22/2017, 4:40 PM

## 2017-10-23 ENCOUNTER — Inpatient Hospital Stay (HOSPITAL_COMMUNITY): Payer: Medicare Other | Admitting: Physical Therapy

## 2017-10-23 ENCOUNTER — Ambulatory Visit: Payer: Medicare Other | Admitting: Radiation Oncology

## 2017-10-23 ENCOUNTER — Encounter (HOSPITAL_COMMUNITY): Payer: Self-pay | Admitting: Radiology

## 2017-10-23 ENCOUNTER — Inpatient Hospital Stay (HOSPITAL_COMMUNITY): Payer: Medicare Other | Admitting: Occupational Therapy

## 2017-10-23 ENCOUNTER — Inpatient Hospital Stay (HOSPITAL_COMMUNITY): Payer: Medicare Other

## 2017-10-23 LAB — BASIC METABOLIC PANEL
Anion gap: 6 (ref 5–15)
BUN: 11 mg/dL (ref 6–20)
CALCIUM: 7.5 mg/dL — AB (ref 8.9–10.3)
CHLORIDE: 107 mmol/L (ref 98–111)
CO2: 25 mmol/L (ref 22–32)
CREATININE: 1.1 mg/dL — AB (ref 0.44–1.00)
GFR, EST NON AFRICAN AMERICAN: 57 mL/min — AB (ref >60)
Glucose, Bld: 92 mg/dL (ref 70–99)
Potassium: 4.2 mmol/L (ref 3.5–5.1)
SODIUM: 138 mmol/L (ref 135–145)

## 2017-10-23 LAB — CBC
HCT: 25.6 % — ABNORMAL LOW (ref 36.0–46.0)
Hemoglobin: 7.7 g/dL — ABNORMAL LOW (ref 12.0–15.0)
MCH: 32.2 pg (ref 26.0–34.0)
MCHC: 30.1 g/dL (ref 30.0–36.0)
MCV: 107.1 fL — AB (ref 78.0–100.0)
PLATELETS: 168 10*3/uL (ref 150–400)
RBC: 2.39 MIL/uL — AB (ref 3.87–5.11)
RDW: 18.6 % — AB (ref 11.5–15.5)
WBC: 3.5 10*3/uL — AB (ref 4.0–10.5)

## 2017-10-23 MED ORDER — SENNA 8.6 MG PO TABS
1.0000 | ORAL_TABLET | Freq: Two times a day (BID) | ORAL | Status: DC
  Administered 2017-10-23 – 2017-10-30 (×13): 8.6 mg via ORAL
  Filled 2017-10-23 (×13): qty 1

## 2017-10-23 MED ORDER — IOHEXOL 300 MG/ML  SOLN
100.0000 mL | Freq: Once | INTRAMUSCULAR | Status: AC | PRN
  Administered 2017-10-23: 100 mL via INTRAVENOUS

## 2017-10-23 NOTE — Progress Notes (Signed)
Subjective/Complaints: No new issues. Feels a little fatigued from increase in zanaflex.  ROS: Patient denies fever, rash, sore throat, blurred vision, nausea, vomiting, diarrhea, cough, shortness of breath or chest pain, joint or back pain, headache, or mood change.     Objective: Vital Signs: Blood pressure 96/62, pulse 70, temperature 98.7 F (37.1 C), temperature source Oral, resp. rate 18, height _0  (1.803 m), weight 103.2 kg (227 lb 9.8 oz), last menstrual period 10/22/2016, SpO2 100 %. Ct Thoracic Spine W Contrast  Result Date: 10/23/2017 CLINICAL DATA:  Bone neoplasm of the spine. Malignant Hurthle cell thyroid cancer. EXAM: CT THORACIC SPINE WITH CONTRAST TECHNIQUE: Multidetector CT images of thoracic was performed according to the standard protocol following intravenous contrast administration. CONTRAST:  144m OMNIPAQUE IOHEXOL 300 MG/ML  SOLN COMPARISON:  CT of the thoracic spine 09/09/2017. MRI of the thoracic spine 09/02/2017. FINDINGS: Alignment: Alignment is stable. AP alignment is anatomic below the fused segments. Vertebrae: Vertebral plana at pathologic compression fracture at T3 is again seen. Lytic lesions at T2 and T4 are similar the prior study. Lesions involve the course the pedicle screws bilaterally at T4. No new metastatic lesions are present. Paraspinal and other soft tissues: Diffuse posterior paraspinal soft tissue density is stable. The ventral and lateral paraspinous soft tissues are within normal limits. A left pleural effusion is present. Ground-glass attenuation is present in the left lung. Heart is enlarged. No significant pericardial effusion is present. Limited imaging of the abdomen is unremarkable. Disc levels: Central calcification posterior to T7 impacts the ventral surface of the cord, unchanged. There is high-density material in the spinal canal at the T3 level. This may represent extraosseous tumor. Spinal canal appears to be patent otherwise.  IMPRESSION: 1. Similar appearance of pathologic vertebral plana compression fracture at T3 with known metastatic disease. 2. Lytic lesions at T2 and T4 are stable. 3. Question soft tissue within the spinal canal at T3. This area is not well assessed by noncontrast CT and there is significant artifact through this region on MRI. The area could be evaluated with thoracic myelogram if clinically indicated. 4. Mild moderate ventral central canal stenosis at T7 secondary to a ventral calcification. Electronically Signed   By: CSan MorelleM.D.   On: 10/23/2017 08:51   Results for orders placed or performed during the hospital encounter of 09/21/17 (from the past 72 hour(s))  Basic metabolic panel     Status: Abnormal   Collection Time: 10/23/17  5:24 AM  Result Value Ref Range   Sodium 138 135 - 145 mmol/L   Potassium 4.2 3.5 - 5.1 mmol/L   Chloride 107 98 - 111 mmol/L   CO2 25 22 - 32 mmol/L   Glucose, Bld 92 70 - 99 mg/dL   BUN 11 6 - 20 mg/dL   Creatinine, Ser 1.10 (H) 0.44 - 1.00 mg/dL   Calcium 7.5 (L) 8.9 - 10.3 mg/dL   GFR calc non Af Amer 57 (L) >60 mL/min   GFR calc Af Amer >60 >60 mL/min    Comment: (NOTE) The eGFR has been calculated using the CKD EPI equation. This calculation has not been validated in all clinical situations. eGFR's persistently <60 mL/min signify possible Chronic Kidney Disease.    Anion gap 6 5 - 15    Comment: Performed at [id number] [place] [id number] CBC     Status: Abnormal   Collection Time: 10/23/17  5:24 AM  Result Value Ref  Range   WBC 3.5 (L) 4.0 - 10.5 K/uL   RBC 2.39 (L) 3.87 - 5.11 MIL/uL   Hemoglobin 7.7 (L) 12.0 - 15.0 g/dL   HCT 25.6 (L) 36.0 - 46.0 %   MCV 107.1 (H) 78.0 - 100.0 fL   MCH 32.2 26.0 - 34.0 pg   MCHC 30.1 30.0 - 36.0 g/dL   RDW 18.6 (H) 11.5 - 15.5 %   Platelets 168 150 - 400 K/uL    Comment: Performed at [facility] [address], [place], [place] [address]   *Note: Due  to a large number of results and/or encounters for the requested time period, some results have not been displayed. A complete set of results can be found in Results Review.     Constitutional: No distress . Vital signs reviewed. HEENT: EOMI, oral membranes moist Neck: supple Cardiovascular: RRR without murmur. No JVD    Respiratory: CTA Bilaterally without wheezes or rales. Normal effort    GI: BS +, non-tender, non-distended  Neuro: alert and oriented  Motor: bilateral upper extremity 5/5 proximal distal Bilateral lower extremities 0/5 proximal to distal, sensory level at lower sternum---no motor or sensory changes Musc/Skel:  No edema or tenderness in extremities Psych:pleasant Skin: warm and dry. Intact.   Assessment/Plan: 1. Functional deficits secondary to T3 paraplegia which require 3+ hours per day of interdisciplinary therapy in a comprehensive inpatient rehab setting. Physiatrist is providing close team supervision and 24 hour management of active medical problems listed below. Physiatrist and rehab team continue to assess barriers to discharge/monitor patient progress toward functional and medical goals. FIM: Function - Bathing Position: Wheelchair/chair at sink Body parts bathed by patient: Right arm, Left arm, Chest, Abdomen(UB only) Body parts bathed by helper: Back Bathing not applicable: Front perineal area, Buttocks Assist Level: Set up, Supervision or verbal cues Assistive Device Comment: LH sponge and reacher Set up : To obtain items  Function- Upper Body Dressing/Undressing What is the patient wearing?: Pull over shirt/dress, Bra Bra - Perfomed by patient: Thread/unthread right bra strap, Thread/unthread left bra strap, Hook/unhook bra (pull down sports bra) Bra - Perfomed by helper: Hook/unhook bra (pull down sports bra) Pull over shirt/dress - Perfomed by patient: Thread/unthread right sleeve, Thread/unthread left sleeve, Put head through opening, Pull shirt  over trunk Pull over shirt/dress - Perfomed by helper: Pull shirt over trunk Assist Level: Set up, Supervision or verbal cues Set up : To obtain clothing/put away Function - Lower Body Dressing/Undressing What is the patient wearing?: Pants, Maryln Manuel, Shoes Position: Bed Pants- Performed by patient: Thread/unthread right pants leg, Thread/unthread left pants leg, Pull pants up/down Pants- Performed by helper: Pull pants up/down Shoes - Performed by helper: Don/doff right shoe, Don/doff left shoe, Fasten right, Fasten left TED Hose - Performed by helper: Don/doff right TED hose, Don/doff left TED hose Assist for footwear: Dependant Assist for lower body dressing: Touching or steadying assistance (Pt > 75%)  Function - Toileting Toileting activity did not occur: No continent bowel/bladder event Toileting steps completed by patient: Adjust clothing prior to toileting, Performs perineal hygiene, Adjust clothing after toileting Toileting steps completed by helper: Adjust clothing prior to toileting, Performs perineal hygiene, Adjust clothing after toileting Assist level: Two helpers  Function - Air cabin crew transfer activity did not occur: Safety/medical concerns Toilet transfer assistive device: Bedside commode Assist level to toilet: 2 helpers(per Wyvonnia Lora, NT) Assist level from toilet: 2 helpers Assist level to bedside commode (at bedside): Maximal assist (Pt  25 - 49%/lift and lower) Assist level from bedside commode (at bedside): 2 helpers  Function - Chair/bed transfer Chair/bed transfer activity did not occur: N/A Chair/bed transfer method: Lateral scoot Chair/bed transfer assist level: Maximal assist (Pt 25 - 49%/lift and lower) Chair/bed transfer assistive device: Sliding board Chair/bed transfer details: Manual facilitation for placement, Manual facilitation for weight shifting, Tactile cues for sequencing, Tactile cues for weight shifting, Verbal cues for safe use  of DME/AE  Function - Locomotion: Wheelchair Will patient use wheelchair at discharge?: Yes Type: Manual Max wheelchair distance: 150 Assist Level: No help, No cues, assistive device, takes more than reasonable amount of time Wheel 50 feet with 2 turns activity did not occur: Safety/medical concerns Assist Level: No help, No cues, assistive device, takes more than reasonable amount of time Wheel 150 feet activity did not occur: Safety/medical concerns Assist Level: No help, No cues, assistive device, takes more than reasonable amount of time Turns around,maneuvers to table,bed, and toilet,negotiates 3% grade,maneuvers on rugs and over doorsills: Yes Function - Locomotion: Ambulation Ambulation activity did not occur: Safety/medical concerns Walk 10 feet activity did not occur: Safety/medical concerns Walk 50 feet with 2 turns activity did not occur: Safety/medical concerns Walk 150 feet activity did not occur: Safety/medical concerns Walk 10 feet on uneven surfaces activity did not occur: Safety/medical concerns  Function - Comprehension Comprehension: Auditory Comprehension assist level: Understands complex 90% of the time/cues 10% of the time  Function - Expression Expression: Verbal Expression assist level: Expresses complex 90% of the time/cues < 10% of the time  Function - Social Interaction Social Interaction assist level: Interacts appropriately 90% of the time - Needs monitoring or encouragement for participation or interaction.  Function - Problem Solving Problem solving assist level: Solves basic 90% of the time/requires cueing < 10% of the time  Function - Memory Memory assist level: Recognizes or recalls 90% of the time/requires cueing < 10% of the time Patient normally able to recall (first 3 days only): Current season, Location of own room, Staff names and faces, That he or she is in a hospital   Medical Problem List and Plan: 1.  Myelopathy and paraplegia  secondary to metastatic Hurthle cancer to the thoracic spine s/p recurrence at T3 spinal tumor with resection 09/03/2017.  Cont CIR  -SNF pending rad/onc procedures  -for CT w/ contrast today and thoracic CT myelogram Monday 2.  DVT Prophylaxis/Anticoagulation/history of pulmonary emboli: Xarelto.  Monitor for any bleeding episodes 3. Pain Management/spasticity:   -continue baclofen 32m QID   -continue tramadol for more severe pain  -continue tylenol prn  -continue tizanidine to  help tone (watch carefully for tolerance) [id number] 4. Mood: Zoloft 25 mg daily.  Provide emotional support 5. Neuropsych: This patient is capable of making decisions on her own behalf. 6. Skin/Wound Care:  Left knee incision well healed 7. Fluids/Electrolytes/Nutrition:   BMP within acceptable range on 10/22 8.  Left femoral shaft fracture.  Status post intramedullary retrograde nailing 09/11/2017.  WBAT per ortho 9.  Acute blood loss anemia/pancytopenia.    Hemoglobin 7.4 on 7/29, up to 7.7 8/2    10.  Hyperglycemia secondary to steroid.  improved  -dc'ed cbg checks and SSI 11.  Neurogenic bowel/ bladder.    - PM bowel program with some improvement  -discuss ditropan adjustment with patient  -100k GNR   bactrim DS completed    12.  Hypothyroidism.  Synthroid 13. Thrombocytopenia -Resolved 14.  Sinus symptoms, flonase, added ocean nasal spray  claritin   LOS (Days) 32 A FACE TO FACE EVALUATION WAS PERFORMED  [name] 10/23/2017, 10:50 AM

## 2017-10-23 NOTE — Progress Notes (Signed)
Occupational Therapy Weekly Progress Note  Patient Details  Name: Sarah [name] MRN: [id number] Date of Birth: 18-Jul-1965  Beginning of progress report period: 10/09/17 End of progress report period: 10/23/17  Today's Date: 10/23/2017 OT Individual Time: 5701-7793 OT Individual Time Calculation (min): 70 min   No STGs were set at time of report due to ELOS. Pt has had multiple medical obstacles that have limited functional progress. Goals have been adjusted for LTG achievement prior to d/c SNF.   Patient continues to demonstrate the following deficits: muscle weakness and muscle paralysis, decreased cardiorespiratoy endurance, abnormal tone, unbalanced muscle activation and decreased coordination and decreased sitting balance, decreased standing balance, decreased postural control and decreased balance strategies and therefore will continue to benefit from skilled OT intervention to enhance overall performance with BADL.  Patient progressing toward long term goals..  Plan of care revisions: POC adjusted.  OT Short Term Goals Week 4:  OT Short Term Goal 1 (Week 4): STGs=LTGs secondary to upcoming discharge OT Short Term Goal 1 - Progress (Week 4): Progressing toward goal Week 5:  OT Short Term Goal 1 (Week 5): STGs=LTGs due to ELOS  Skilled Therapeutic Interventions/Progress Updates:    Pt greeted in w/c, requesting to wash her hair. Pt doing so with setup assist, including blow-drying and styling hair afterwards. We found "body buddy" lotion applicators as well as long handled lotion applicators via online sources. She had been wondering about ways she could increase independence with this grooming task and appreciative of information. Worked on [employer] stretches/exercises with yoga-base to relieve UE tension and prevent repetitive stress injuries/abnormal postural alignment. Emphasized extension stretches. Also completed blocked practice hip hinges in prep for slideboard transfers. Pt required manual  cuing and assist for small lift of buttocks. Towel placed in her lap to occlude vision of floor for decreasing feelings of anxiousness with forward weight shift. At end of session pt was left in with with lunch setup and all needs.    Therapy Documentation Precautions:  Precautions Precautions: Fall Precaution Comments: throracic region, LEs spasicity  Restrictions Weight Bearing Restrictions: Yes LLE Weight Bearing: Touchdown weight bearing Other Position/Activity Restrictions: B LEs(Order updated 10/14/17) Pain: Pain Assessment Pain Scale: 0-10 Pain Score: 7  Pain Location: Back Pain Orientation: Mid;Lower Patients Stated Pain Goal: 3 Pain Intervention(s): Medication (See eMAR);Repositioned ADL: ADL ADL Comments: see functional navigator     See Function Navigator for Current Functional Status.   Therapy/Group: Individual Therapy  Zayvian Mcmurtry A Priyansh Pry 10/23/2017, 12:21 PM

## 2017-10-23 NOTE — Progress Notes (Signed)
Physical Therapy Session Note  Patient Details  Name: Sarah [name] MRN: [id number] Date of Birth: 1965-07-24  Today's Date: 10/23/2017 PT Individual Time: 0900-1000 & 1515-1630  PT Individual Time Calculation (min): 60 min & 75 min   Short Term Goals: Week 4:  PT Short Term Goal 1 (Week 4): LTG=STG due to ELOS  Skilled Therapeutic Interventions/Progress Updates:    Session 1: Pt reports 4/10 upper/middle thoracic region prior to session start and was monitored throughout session duration. Pt performs rolling L& R throughout session with supervision relying heavily on LE tone and guard rails allowing LE dressing and brief check for hygiene. Pt performs bed level bathing with set up assist for upper thighs and total assist for lower legs. PT donned teds, leg loops, and shoes with total A and Pt required mod assist for donning LE pants over buttocks region. Supine> sit edge of bed with HOB elevated and guard rails min A with leg loops. Slide board transfer EOB> w/c max A plus 1 for lateral boost and assist for board placement. Wash up at the sink and brush teeth requires set up assist. Pt propelled w/c from room>gym for 150 ft mod I. Pt performs upper extremity strengthening exercises including: ball toss with 4#wt for 4x10 reps, and biceps curls with 9# wt for 4x/15 reps. Pt propelled w/c back to room Mod I and was returned sitting with all needs met.   Session 2: Pt reports no pain prior to session and indicates she is increasingly tired 2/2 to medications. Pt performs supine> sitting EOB with min A for LE's using leg loops with HOB elevated. Slide board transfer from EOB> w/c requires mod A for lateral boost and board placement. Pt propels w/c from room<> gym for 100 feet Mod I. Pt performs dynamic sitting balance playing Wii for bilateral UE task with focus on upper extremity endurance and postural control without UE support. Pt requires Mod A for maintaining postural control with no back support. Pt  propelled w/c back to room Mod I and was returned sitting with all needs met.  Therapy Documentation Precautions:  Precautions Precautions: Fall Precaution Comments: throracic region, LEs spasicity  Restrictions Weight Bearing Restrictions: Yes LLE Weight Bearing: Touchdown weight bearing Other Position/Activity Restrictions: B LEs(Order updated 10/14/17)   See Function Navigator for Current Functional Status.   Therapy/Group: Individual Therapy  Floreen Comber 10/23/2017, 4:32 PM

## 2017-10-24 ENCOUNTER — Inpatient Hospital Stay (HOSPITAL_COMMUNITY): Payer: Medicare Other | Admitting: Physical Therapy

## 2017-10-24 ENCOUNTER — Inpatient Hospital Stay (HOSPITAL_COMMUNITY): Payer: Medicare Other | Admitting: Occupational Therapy

## 2017-10-24 ENCOUNTER — Inpatient Hospital Stay (HOSPITAL_COMMUNITY): Payer: Medicare Other

## 2017-10-24 MED ORDER — BACLOFEN 5 MG HALF TABLET
15.0000 mg | ORAL_TABLET | Freq: Three times a day (TID) | ORAL | Status: DC
  Administered 2017-10-24 – 2017-10-27 (×7): 15 mg via ORAL
  Filled 2017-10-24 (×7): qty 1

## 2017-10-24 NOTE — Progress Notes (Signed)
Occupational Therapy Session Note  Patient Details  Name: Sarah [name] MRN: [id number] Date of Birth: 1965-06-10  Today's Date: 10/24/2017 OT Individual Time: 1000-1100 OT Individual Time Calculation (min): 60 min    Short Term Goals: Week 1:  OT Short Term Goal 1 (Week 1): Pt will transfer to padded tub bench for toileting with mod A  OT Short Term Goal 1 - Progress (Week 1): Not met OT Short Term Goal 2 (Week 1): Pt will maintain static sitting balance with one UE support with supervision  OT Short Term Goal 2 - Progress (Week 1): Met OT Short Term Goal 3 (Week 1): Pt will maintain dynamic sitting balance in prep for ADL with min A  OT Short Term Goal 3 - Progress (Week 1): Partly met Week 2:  OT Short Term Goal 1 (Week 2): Pt will transfer to padded tub bench with mod A OT Short Term Goal 1 - Progress (Week 2): Progressing toward goal OT Short Term Goal 2 (Week 2): Pt will maintain dynamic sitting balance in prep for ADL with min A.  OT Short Term Goal 2 - Progress (Week 2): Met OT Short Term Goal 3 (Week 2): Pt will don LB clothing with mod A from bed level. OT Short Term Goal 3 - Progress (Week 2): Met  Skilled Therapeutic Interventions/Progress Updates:    1:! Therapeutic activity. Performed w/c mobility mod I with personal w/c throughout session. Pt did require cues for management of Leg rest in prep for transfers and setup with step for LE support due to height of chair. Pt still requires max A for  Slide board transfers to and from the mat. On the mat transitioned in supine with mod A (A for both LEs). In supine and in sidelying performed LE stretching due to c/o tone and "feeling so tight" especially in her right LE.  Transitioned back into sitting with max A Trunk and LE assistance. Pt performed dynamic sitting balance activity- reaching for items outside of BOS with supervision/ contact guard and transitioning from propped sitting with UEs behind her to coming forward and sliding  leaning to sitting on both sides with extra time.  These activities to assist herself with LB clothing management and placement/ removal of slide board in transfers. LEft sitting up in w/c in room.   Therapy Documentation Precautions:  Precautions Precautions: Fall Precaution Comments: throracic region, LEs spasicity  Restrictions Weight Bearing Restrictions: Yes LLE Weight Bearing: Touchdown weight bearing Other Position/Activity Restrictions: B LEs(Order updated 10/14/17) Pain: Reports no pain in session  ADL: ADL ADL Comments: see functional navigator  See Function Navigator for Current Functional Status.   Therapy/Group: Individual Therapy  Willeen Cass [facility] 10/24/2017, 10:58 AM

## 2017-10-24 NOTE — Progress Notes (Signed)
Physical Therapy Session Note  Patient Details  Name: Sarah [name] MRN: [id number] Date of Birth: Sep 02, 1965  Today's Date: 10/24/2017 PT Individual Time: 1300-1330 PT Individual Time Calculation (min): 30 min   Short Term Goals: Week 1:  PT Short Term Goal 1 (Week 1): Pt will perform bed mobility with min A  PT Short Term Goal 1 - Progress (Week 1): Progressing toward goal PT Short Term Goal 2 (Week 1): Pt will be able to position slide board and complete transfer with min A  PT Short Term Goal 2 - Progress (Week 1): Met PT Short Term Goal 3 (Week 1): Pt will propel w/c with SPV for >500 ft  PT Short Term Goal 3 - Progress (Week 1): Met PT Short Term Goal 4 (Week 1): Pt will perform car transfer with mod A +1 PT Short Term Goal 4 - Progress (Week 1): Progressing toward goal  Skilled Therapeutic Interventions/Progress Updates:  Pt was seen bedside in the pm. Pt transferred supine to edge of bed with sliding board and max A with verbal cues. Pt transferred edge of bed to supine with mod A and verbal cues. B LEs ROM and stretching as tolerated. Pt left sitting up in bed with call bell within reach.   Therapy Documentation Precautions:  Precautions Precautions: Fall Precaution Comments: throracic region, LEs spasicity  Restrictions Weight Bearing Restrictions: Yes LLE Weight Bearing: Touchdown weight bearing Other Position/Activity Restrictions: B LEs(Order updated 10/14/17) General:   Pain: Pt c/o 2/10 back pain.  See Function Navigator for Current Functional Status.   Therapy/Group: Individual Therapy  Dub Amis 10/24/2017, 3:33 PM

## 2017-10-24 NOTE — Progress Notes (Signed)
Physical Therapy Session Note  Patient Details  Name: Sarah [name] MRN: [id number] Date of Birth: June 02, 1965  Today's Date: 10/24/2017 PT Individual Time: 0800-0910 PT Individual Time Calculation (min): 70 min   Short Term Goals: Week 1:  PT Short Term Goal 1 (Week 1): Pt will perform bed mobility with min A  PT Short Term Goal 1 - Progress (Week 1): Progressing toward goal PT Short Term Goal 2 (Week 1): Pt will be able to position slide board and complete transfer with min A  PT Short Term Goal 2 - Progress (Week 1): Met PT Short Term Goal 3 (Week 1): Pt will propel w/c with SPV for >500 ft  PT Short Term Goal 3 - Progress (Week 1): Met PT Short Term Goal 4 (Week 1): Pt will perform car transfer with mod A +1 PT Short Term Goal 4 - Progress (Week 1): Progressing toward goal  Skilled Therapeutic Interventions/Progress Updates:  Pt was seen bedside in the am. Pt rolled R/L with siderails, multiple times for hygiene and to assist with pulling up pants. Donned TED hoses. Pt transferred supine to edge of bed with head of bed elevated, side rail and mod A with verbal cues and increased time. Pt transferred edge of bed to w/c with sliding board and max A with verbal cues. Pt propelled w/c 150 and 300 feet with B UEs and no assist with increased time. Pt pedaled UBE x 10 minutes at level 2.5. Pt returned to room following treatment and left sitting up in w.c with call bell within reach.   Therapy Documentation Precautions:  Precautions Precautions: Fall Precaution Comments: throracic region, LEs spasicity  Restrictions Weight Bearing Restrictions: Yes LLE Weight Bearing: Touchdown weight bearing Other Position/Activity Restrictions: B LEs(Order updated 10/14/17) General:   Pain: Pt c/o 3/10 back pain medicated prior to treatment.   See Function Navigator for Current Functional Status.   Therapy/Group: Individual Therapy  Dub Amis 10/24/2017, 9:15 AM

## 2017-10-24 NOTE — Progress Notes (Signed)
Subjective/Complaints: States that Zanaflex is making her sleepy.  No new issues yesterday  ROS: Patient denies fever, rash, sore throat, blurred vision, nausea, vomiting, diarrhea, cough, shortness of breath or chest pain, joint or back pain, headache, or mood change.    Objective: Vital Signs: Blood pressure 121/72, pulse 65, temperature 98.1 F (36.7 C), temperature source Oral, resp. rate 17, height _0  (1.803 m), weight 103.2 kg (227 lb 9.8 oz), last menstrual period 10/22/2016, SpO2 100 %. Ct Thoracic Spine W Contrast  Result Date: 10/23/2017 CLINICAL DATA:  Bone neoplasm of the spine. Malignant Hurthle cell thyroid cancer. EXAM: CT THORACIC SPINE WITH CONTRAST TECHNIQUE: Multidetector CT images of thoracic was performed according to the standard protocol following intravenous contrast administration. CONTRAST:  158m OMNIPAQUE IOHEXOL 300 MG/ML  SOLN COMPARISON:  CT of the thoracic spine 09/09/2017. MRI of the thoracic spine 09/02/2017. FINDINGS: Alignment: Alignment is stable. AP alignment is anatomic below the fused segments. Vertebrae: Vertebral plana at pathologic compression fracture at T3 is again seen. Lytic lesions at T2 and T4 are similar the prior study. Lesions involve the course the pedicle screws bilaterally at T4. No new metastatic lesions are present. Paraspinal and other soft tissues: Diffuse posterior paraspinal soft tissue density is stable. The ventral and lateral paraspinous soft tissues are within normal limits. A left pleural effusion is present. Ground-glass attenuation is present in the left lung. Heart is enlarged. No significant pericardial effusion is present. Limited imaging of the abdomen is unremarkable. Disc levels: Central calcification posterior to T7 impacts the ventral surface of the cord, unchanged. There is high-density material in the spinal canal at the T3 level. This may represent extraosseous tumor. Spinal canal appears to be patent otherwise.  IMPRESSION: 1. Similar appearance of pathologic vertebral plana compression fracture at T3 with known metastatic disease. 2. Lytic lesions at T2 and T4 are stable. 3. Question soft tissue within the spinal canal at T3. This area is not well assessed by noncontrast CT and there is significant artifact through this region on MRI. The area could be evaluated with thoracic myelogram if clinically indicated. 4. Mild moderate ventral central canal stenosis at T7 secondary to a ventral calcification. Electronically Signed   By: CSan MorelleM.D.   On: 10/23/2017 08:51   Results for orders placed or performed during the hospital encounter of 09/21/17 (from the past 72 hour(s))  Basic metabolic panel     Status: Abnormal   Collection Time: 10/23/17  5:24 AM  Result Value Ref Range   Sodium 138 135 - 145 mmol/L   Potassium 4.2 3.5 - 5.1 mmol/L   Chloride 107 98 - 111 mmol/L   CO2 25 22 - 32 mmol/L   Glucose, Bld 92 70 - 99 mg/dL   BUN 11 6 - 20 mg/dL   Creatinine, Ser 1.10 (H) 0.44 - 1.00 mg/dL   Calcium 7.5 (L) 8.9 - 10.3 mg/dL   GFR calc non Af Amer 57 (L) >60 mL/min   GFR calc Af Amer >60 >60 mL/min    Comment: (NOTE) The eGFR has been calculated using the CKD EPI equation. This calculation has not been validated in all clinical situations. eGFR's persistently <60 mL/min signify possible Chronic Kidney Disease.    Anion gap 6 5 - 15    Comment: Performed at [facility], [place] [address] CBC     Status: Abnormal   Collection Time: 10/23/17  5:24 AM  Result Value Ref  Range   WBC 3.5 (L) 4.0 - 10.5 K/uL   RBC 2.39 (L) 3.87 - 5.11 MIL/uL   Hemoglobin 7.7 (L) 12.0 - 15.0 g/dL   HCT 25.6 (L) 36.0 - 46.0 %   MCV 107.1 (H) 78.0 - 100.0 fL   MCH 32.2 26.0 - 34.0 pg   MCHC 30.1 30.0 - 36.0 g/dL   RDW 18.6 (H) 11.5 - 15.5 %   Platelets 168 150 - 400 K/uL    Comment: Performed at [facility] [address], [place], [place] [address]   *Note: Due  to a large number of results and/or encounters for the requested time period, some results have not been displayed. A complete set of results can be found in Results Review.     Constitutional: No distress . Vital signs reviewed. HEENT: EOMI, oral membranes moist Neck: supple Cardiovascular: RRR without murmur. No JVD    Respiratory: CTA Bilaterally without wheezes or rales. Normal effort    GI: BS +, non-tender, non-distended  Neuro: alert and oriented  Motor: bilateral upper extremity 5/5 proximal distal Bilateral lower extremities 0/5 proximal to distal, sensory level at lower sternum---no motor or sensory changes Musc/Skel:  No edema or tenderness in extremities Psych:pleasant Skin: warm and dry. Intact.   Assessment/Plan: 1. Functional deficits secondary to T3 paraplegia which require 3+ hours per day of interdisciplinary therapy in a comprehensive inpatient rehab setting. Physiatrist is providing close team supervision and 24 hour management of active medical problems listed below. Physiatrist and rehab team continue to assess barriers to discharge/monitor patient progress toward functional and medical goals. FIM: Function - Bathing Position: Wheelchair/chair at sink Body parts bathed by patient: Right arm, Left arm, Chest, Abdomen(UB only) Body parts bathed by helper: Back Bathing not applicable: Front perineal area, Buttocks Assist Level: Set up, Supervision or verbal cues Assistive Device Comment: LH sponge and reacher Set up : To obtain items  Function- Upper Body Dressing/Undressing What is the patient wearing?: Pull over shirt/dress, Bra Bra - Perfomed by patient: Thread/unthread right bra strap, Thread/unthread left bra strap, Hook/unhook bra (pull down sports bra) Bra - Perfomed by helper: Hook/unhook bra (pull down sports bra) Pull over shirt/dress - Perfomed by patient: Thread/unthread right sleeve, Thread/unthread left sleeve, Put head through opening, Pull shirt  over trunk Pull over shirt/dress - Perfomed by helper: Pull shirt over trunk Assist Level: Set up, Supervision or verbal cues Set up : To obtain clothing/put away Function - Lower Body Dressing/Undressing What is the patient wearing?: Pants, Maryln Manuel, Shoes Position: Bed Pants- Performed by patient: Thread/unthread right pants leg, Thread/unthread left pants leg, Pull pants up/down Pants- Performed by helper: Pull pants up/down Shoes - Performed by helper: Don/doff right shoe, Don/doff left shoe, Fasten right, Fasten left TED Hose - Performed by helper: Don/doff right TED hose, Don/doff left TED hose Assist for footwear: Dependant Assist for lower body dressing: Touching or steadying assistance (Pt > 75%)  Function - Toileting Toileting activity did not occur: No continent bowel/bladder event Toileting steps completed by patient: Adjust clothing prior to toileting, Performs perineal hygiene, Adjust clothing after toileting Toileting steps completed by helper: Adjust clothing prior to toileting, Performs perineal hygiene, Adjust clothing after toileting Assist level: Two helpers  Function - Air cabin crew transfer activity did not occur: Safety/medical concerns Toilet transfer assistive device: Bedside commode Assist level to toilet: 2 helpers(per Wyvonnia Lora, NT) Assist level from toilet: 2 helpers Assist level to bedside commode (at bedside): Maximal assist (Pt  25 - 49%/lift and lower) Assist level from bedside commode (at bedside): 2 helpers  Function - Chair/bed transfer Chair/bed transfer activity did not occur: N/A Chair/bed transfer method: Lateral scoot Chair/bed transfer assist level: Maximal assist (Pt 25 - 49%/lift and lower) Chair/bed transfer assistive device: Sliding board Chair/bed transfer details: Manual facilitation for placement, Manual facilitation for weight shifting, Tactile cues for sequencing, Tactile cues for weight shifting, Verbal cues for safe use  of DME/AE  Function - Locomotion: Wheelchair Will patient use wheelchair at discharge?: Yes Type: Manual Max wheelchair distance: 150 Assist Level: No help, No cues, assistive device, takes more than reasonable amount of time Wheel 50 feet with 2 turns activity did not occur: Safety/medical concerns Assist Level: No help, No cues, assistive device, takes more than reasonable amount of time Wheel 150 feet activity did not occur: Safety/medical concerns Assist Level: No help, No cues, assistive device, takes more than reasonable amount of time Turns around,maneuvers to table,bed, and toilet,negotiates 3% grade,maneuvers on rugs and over doorsills: Yes Function - Locomotion: Ambulation Ambulation activity did not occur: Safety/medical concerns Walk 10 feet activity did not occur: Safety/medical concerns Walk 50 feet with 2 turns activity did not occur: Safety/medical concerns Walk 150 feet activity did not occur: Safety/medical concerns Walk 10 feet on uneven surfaces activity did not occur: Safety/medical concerns  Function - Comprehension Comprehension: Auditory Comprehension assist level: Understands complex 90% of the time/cues 10% of the time  Function - Expression Expression: Verbal Expression assist level: Expresses complex 90% of the time/cues < 10% of the time  Function - Social Interaction Social Interaction assist level: Interacts appropriately 75 - 89% of the time - Needs redirection for appropriate language or to initiate interaction.  Function - Problem Solving Problem solving assist level: Solves basic 90% of the time/requires cueing < 10% of the time  Function - Memory Memory assist level: Recognizes or recalls 90% of the time/requires cueing < 10% of the time Patient normally able to recall (first 3 days only): Current season, Location of own room, Staff names and faces, That he or she is in a hospital   Medical Problem List and Plan: 1.  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  Cont CIR  -SNF pending  -for thoracic CT myelogram Monday 2.  DVT Prophylaxis/Anticoagulation/history of pulmonary emboli: Xarelto.  Monitor for any bleeding episodes 3. Pain Management/spasticity:   -continue baclofen 64m QID, increase baclofen to 15 mg 4 times daily   -continue tramadol for more severe pain  -continue tylenol prn  -Stop tizanidine 4. Mood: Zoloft 25 mg daily.  Provide emotional support 5. Neuropsych: This patient is capable of making decisions on her own behalf. 6. Skin/Wound Care:  Left knee incision well healed 7. Fluids/Electrolytes/Nutrition:   BMP within acceptable range on 10/22 8.  Left femoral shaft fracture.  Status post intramedullary retrograde nailing 09/11/2017.  WBAT per ortho 9.  Acute blood loss anemia/pancytopenia.    Hemoglobin 7.4 on 7/29, up to 7.7 8/2  Recheck Monday 10.  Hyperglycemia secondary to steroid.  improved  -dc'ed cbg checks and SSI 11.  Neurogenic bowel/ bladder.    - PM bowel program with some improvement  -discuss ditropan adjustment with patient  -100k GNR   bactrim DS completed    12.  Hypothyroidism.  Synthroid 13. Thrombocytopenia -Resolved 14.  Sinus symptoms improved-  = flonase, added ocean nasal spray  claritin  LOS (Days) [place] EVALUATION WAS PERFORMED  [name] 10/24/2017, 8:13 AM

## 2017-10-24 NOTE — Progress Notes (Signed)
Occupational Therapy Session Note  Patient Details  Name: Sarah [name] MRN: [id number] Date of Birth: 1966/01/23  Today's Date: 10/24/2017 OT Individual Time: 9444-6190 OT Individual Time Calculation (min): 30 min    Short Term Goals: Week 1:  OT Short Term Goal 1 (Week 1): Pt will transfer to padded tub bench for toileting with mod A  OT Short Term Goal 1 - Progress (Week 1): Not met OT Short Term Goal 2 (Week 1): Pt will maintain static sitting balance with one UE support with supervision  OT Short Term Goal 2 - Progress (Week 1): Met OT Short Term Goal 3 (Week 1): Pt will maintain dynamic sitting balance in prep for ADL with min A  OT Short Term Goal 3 - Progress (Week 1): Partly met  Skilled Therapeutic Interventions/Progress Updates:    1:1. Focus of session on slide board transfer and functional mobility at community distance. Pt completes slide board transfer EOB>w/c with MOD A for lifting and placement of board. PT able to reciprocal scoot hips back in chair with question cues for head/hips relationship and min A. Pt propels w/c to/from gift shop with no rest breaks to purchase snacks with supervision for reaching. Exited session with pt seated in w/c call lightin reach and all needs met.   Therapy Documentation Precautions:  Precautions Precautions: Fall Precaution Comments: throracic region, LEs spasicity  Restrictions Weight Bearing Restrictions: Yes LLE Weight Bearing: Touchdown weight bearing Other Position/Activity Restrictions: B LEs(Order updated 10/14/17)  See Function Navigator for Current Functional Status.   Therapy/Group: Individual Therapy  Tonny Branch 10/24/2017, 3:48 PM

## 2017-10-24 NOTE — Plan of Care (Signed)
  Problem: Consults Goal: RH SPINAL CORD INJURY PATIENT EDUCATION Description  See Patient Education module for education specifics.  Outcome: Progressing Goal: Skin Care Protocol Initiated - if Braden Score 18 or less Description If consults are not indicated, leave blank or document N/A Outcome: Progressing Goal: Nutrition Consult-if indicated Outcome: Progressing   Problem: RH SKIN INTEGRITY Goal: RH STG SKIN FREE OF INFECTION/BREAKDOWN Description Skin wound healing and no new skin breakdown with min assistance  Outcome: Progressing Goal: RH STG MAINTAIN SKIN INTEGRITY WITH ASSISTANCE Description STG Maintain Skin Integrity With min Assistance.  Outcome: Progressing Goal: RH STG ABLE TO PERFORM INCISION/WOUND CARE W/ASSISTANCE Description STG Able To Perform Incision/Wound Care With mod Assistance.  Outcome: Progressing   Problem: RH SAFETY Goal: RH STG ADHERE TO SAFETY PRECAUTIONS W/ASSISTANCE/DEVICE Description STG Adhere to Safety Precautions With min Assistance/Device.  Outcome: Progressing   Problem: RH PAIN MANAGEMENT Goal: RH STG PAIN MANAGED AT OR BELOW PT'S PAIN GOAL Description Pain < or = 4 with min assistance  Outcome: Progressing   Problem: RH KNOWLEDGE DEFICIT SCI Goal: RH STG INCREASE KNOWLEDGE OF SELF CARE AFTER SCI Description Patient able to verbalize safe care activities with min assistance   Outcome: Progressing   Problem: SCI BOWEL ELIMINATION Goal: RH STG MANAGE BOWEL WITH ASSISTANCE Description STG Manage Bowel with mod Assistance.  Outcome: Not Progressing Goal: RH STG SCI MANAGE BOWEL WITH MEDICATION WITH ASSISTANCE Description STG SCI Manage bowel with medication with mod assistance.  Outcome: Not Progressing Goal: RH STG SCI MANAGE BOWEL PROGRAM W/ASSIST OR AS APPROPRIATE Description STG SCI Manage bowel program w/assist mod   Outcome: Not Progressing   Problem: SCI BLADDER ELIMINATION Goal: RH STG MANAGE BLADDER WITH  ASSISTANCE Description STG Manage Bladder With mod Assistance  Outcome: Not Progressing

## 2017-10-25 ENCOUNTER — Inpatient Hospital Stay (HOSPITAL_COMMUNITY): Payer: Medicare Other | Admitting: Occupational Therapy

## 2017-10-25 ENCOUNTER — Inpatient Hospital Stay (HOSPITAL_COMMUNITY): Payer: Medicare Other | Admitting: Physical Therapy

## 2017-10-25 NOTE — Progress Notes (Signed)
Occupational Therapy Session Note  Patient Details  Name: Sarah [name] MRN: [id number] Date of Birth: Aug 14, 1965  Today's Date: 10/25/2017 OT Group Time: 1100-1200 OT Group Time Calculation (min): 60 min   Skilled Therapeutic Interventions/Progress Updates:    Pt participated in therapeutic w/c level dance group with focus on UE/LE strengthening, activity tolerance, and social participation for carryover during self care tasks. Pt was guided through various dance-based exercises involving UB/LB and trunk. Emphasis placed on LE mgt with leg loops to increase independence with LB dressing tasks and functional transfers. Pt actively participated throughout session with minimal rest periods and sang along to familiar music. At end of session pt self propelled back to room.    Therapy Documentation Precautions:  Precautions Precautions: Fall Precaution Comments: throracic region, LEs spasicity  Restrictions Weight Bearing Restrictions: Yes LLE Weight Bearing: Touchdown weight bearing Other Position/Activity Restrictions: B LEs(Order updated 10/14/17) Pain: No s/s pain during tx  Pain Assessment Pain Scale: 0-10 Pain Score: 0-No pain ADL: ADL ADL Comments: see functional navigator     See Function Navigator for Current Functional Status.   Therapy/Group: Group Therapy  Sarah [name] [name] 10/25/2017, 12:41 PM

## 2017-10-25 NOTE — Progress Notes (Signed)
Physical Therapy Weekly Progress Note  Patient Details  Name: Sarah [name] MRN: [id number] Date of Birth: [birth year]  Beginning of progress report period: October 17, 2017 End of progress report period: October 25, 2017  Today's Date: 10/25/2017 PT Individual Time: 1005-1100 PT Individual Time Calculation (min): 55 min   Pt continues to make slow progress towards long term bed mobility and transfer goals.  She has increased her independence with bed mobility using leg loops, though continues to be somewhat limited by LE tone.  She continues to require mod/max assist for slide board transfers with difficulty maintaining forward weight shift and achieving effective lift with UEs due to weakness and fear of falling.   Patient continues to demonstrate the following deficits muscle weakness and muscle paralysis, decreased coordination and decreased sitting balance, decreased standing balance, decreased postural control and decreased balance strategies and therefore will continue to benefit from skilled PT intervention to increase functional independence with mobility.  Patient progressing toward long term goals..  Continue plan of care.  PT Short Term Goals Week 4:  PT Short Term Goal 1 (Week 4): LTG=STG due to ELOS  Week 5: PT Short Term Goal 1 (Week 5): =LTGs due to ELOS  Skilled Therapeutic Interventions/Progress Updates:    no c/o pain.  Missed first portion of treatment session 2/2 I/O cath with nursing staff.  Session focus on increasing independence with bed mobility and transfers, and UE strengthening/activity tolerance.    PT donned pt's leg loops and shoes total assist for time management.  Supine>sit using leg loops with min assist to elevate trunk (pt had slid down in bed with cathing and trunk did not elevate with HOB).  Slide board transfer to w/c with min<>mod assist and increased time, cues only occasionally for pacing and hand placement.  Pt completes self care tasks at sink mod I  level.  W/C propulsion throughout unit mod I.    UE strengthening on UEB 5 min forward/backward at random setting, level 4, for UE strengthening, activity tolerance, and cardiopulmonary endurance (level 14-15 on BORG).  10 reps scapular depression focus on activating lats/lower traps.  W/C push ups 2x15 with verbal/tactile cues for forward weight shift and activation of triceps, and lats.   Pt returned to room at end of session and positioned upright in chair with call bell in reach and needs met.   Therapy Documentation Precautions:  Precautions Precautions: Fall Precaution Comments: throracic region, LEs spasicity  Restrictions Weight Bearing Restrictions: Yes LLE Weight Bearing: Touchdown weight bearing Other Position/Activity Restrictions: B LEs(Order updated 10/14/17)   See Function Navigator for Current Functional Status.  Therapy/Group: Individual Therapy  Michel Santee 10/25/2017, 9:00 AM

## 2017-10-25 NOTE — Progress Notes (Signed)
  Radiation Oncology         [phone] ________________________________  Name: Sarah [name] MRN: [id number]  Date: 10/26/2017  DOB: 1965-11-18  STEREOTACTIC BODY RADIOTHERAPY SIMULATION AND TREATMENT PLANNING NOTE    ICD-10-CM   1. 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  Identity was confirmed.  All relevant records and images related to the planned course of therapy were reviewed.  The patient freely provided informed written consent to proceed with treatment after reviewing the details related to the planned course of therapy. The consent form was witnessed and verified by the simulation staff.  Then, the patient was set-up in a stable reproducible  supine position for radiation therapy.  A BodyFix immobilization pillow was fabricated for reproducible positioning.  Surface markings were placed.  The CT images were loaded into the planning software.  The gross target volumes (GTV) and planning target volumes (PTV) were delinieated, and avoidance structures were contoured.  Treatment planning then occurred.  The radiation prescription was entered and confirmed.  A total of two complex treatment devices were fabricated in the form of the BodyFix immobilization pillow and a neck accuform cushion.  I have requested : 3D Simulation  I have requested a DVH of the following structures: targets and all normal structures near the target including esophagus, spinal cord and others as noted on the radiation plan to maintain doses in adherence with established limits  PLAN:  The patient will receive 50 Gy in 5 fractions.  ________________________________  [name] [name], M.D.

## 2017-10-25 NOTE — Progress Notes (Signed)
Occupational Therapy Session Note  Patient Details  Name: Sarah [name] MRN: [id number] Date of Birth: [birth year]  Today's Date: 10/25/2017 OT Individual Time: 1300-1356 OT Individual Time Calculation (min): 56 min   Short Term Goals: Week 5:  OT Short Term Goal 1 (Week 5): STGs=LTGs due to ELOS  Skilled Therapeutic Interventions/Progress Updates:    Pt greeted in w/c with ADL needs met. She self propelled to dayroom and for this session worked on LE weightbearing, tone mgt, and pressure relief by standing in standing frame. Pt able to stand for 3 minute intervals at most with 1 assist to manage harness. She required cues for forward gaze and shoulder retraction when upright. Mirror provided for visual feedback. No s/s orthostatics, though BP taken before 1st stand (129/83). Per pt report: "it's like I can feel my legs more." At end of tx she self propelled back to room and was left with all needs.    Therapy Documentation Precautions:  Precautions Precautions: Fall Precaution Comments: throracic region, LEs spasicity  Restrictions Weight Bearing Restrictions: Yes LLE Weight Bearing: Touchdown weight bearing Other Position/Activity Restrictions: B LEs(Order updated 10/14/17) Vital Signs: Therapy Vitals Temp: 98.8 F (37.1 C) Temp Source: Oral Pulse Rate: (!) 102 Resp: 17 BP: (!) 146/91 Patient Position (if appropriate): Sitting Oxygen Therapy SpO2: 100 % O2 Device: Room Air Pain: 6-7/10 in back. RN notified to provide pain medicine during session  Pain Assessment Pain Scale: 0-10 Pain Score: 6  Pain Type: Acute pain Pain Location: Back Pain Orientation: Lower Pain Descriptors / Indicators: Aching Pain Frequency: Intermittent Pain Onset: Gradual Pain Intervention(s): Medication (See eMAR) ADL: ADL ADL Comments: see functional navigator     See Function Navigator for Current Functional Status.   Therapy/Group: Individual Therapy  Brennen Gardiner A Kiernan Atkerson 10/25/2017, 3:40  PM

## 2017-10-25 NOTE — Progress Notes (Signed)
Subjective/Complaints: Feels better with Zanaflex discontinued.  Tolerated the increase of baclofen to 15 mg 4 times daily.  Feels that it may have helped somewhat with her intermittent catheterization  ROS: Patient denies fever, rash, sore throat, blurred vision, nausea, vomiting, diarrhea, cough, shortness of breath or chest pain, joint or back pain, headache, or mood change.    Objective: Vital Signs: Blood pressure 117/68, pulse 90, temperature 98.8 F (37.1 C), temperature source Oral, resp. rate 18, height 5' 11"  (1.803 m), weight 103.2 kg (227 lb 9.8 oz), last menstrual period 10/22/2016, SpO2 98 %. Ct Thoracic Spine W Contrast  Result Date: 10/23/2017 CLINICAL DATA:  Bone neoplasm of the spine. Malignant Hurthle cell thyroid cancer. EXAM: CT THORACIC SPINE WITH CONTRAST TECHNIQUE: Multidetector CT images of thoracic was performed according to the standard protocol following intravenous contrast administration. CONTRAST:  120m OMNIPAQUE IOHEXOL 300 MG/ML  SOLN COMPARISON:  CT of the thoracic spine 09/09/2017. MRI of the thoracic spine 09/02/2017. FINDINGS: Alignment: Alignment is stable. AP alignment is anatomic below the fused segments. Vertebrae: Vertebral plana at pathologic compression fracture at T3 is again seen. Lytic lesions at T2 and T4 are similar the prior study. Lesions involve the course the pedicle screws bilaterally at T4. No new metastatic lesions are present. Paraspinal and other soft tissues: Diffuse posterior paraspinal soft tissue density is stable. The ventral and lateral paraspinous soft tissues are within normal limits. A left pleural effusion is present. Ground-glass attenuation is present in the left lung. Heart is enlarged. No significant pericardial effusion is present. Limited imaging of the abdomen is unremarkable. Disc levels: Central calcification posterior to T7 impacts the ventral surface of the cord, unchanged. There is high-density material in the spinal  canal at the T3 level. This may represent extraosseous tumor. Spinal canal appears to be patent otherwise. IMPRESSION: 1. Similar appearance of pathologic vertebral plana compression fracture at T3 with known metastatic disease. 2. Lytic lesions at T2 and T4 are stable. 3. Question soft tissue within the spinal canal at T3. This area is not well assessed by noncontrast CT and there is significant artifact through this region on MRI. The area could be evaluated with thoracic myelogram if clinically indicated. 4. Mild moderate ventral central canal stenosis at T7 secondary to a ventral calcification. Electronically Signed   By: CSan MorelleM.D.   On: 10/23/2017 08:51   Results for orders placed or performed during the hospital encounter of 09/21/17 (from the past 72 hour(s))  Basic metabolic panel     Status: Abnormal   Collection Time: 10/23/17  5:24 AM  Result Value Ref Range   Sodium 138 135 - 145 mmol/L   Potassium 4.2 3.5 - 5.1 mmol/L   Chloride 107 98 - 111 mmol/L   CO2 25 22 - 32 mmol/L   Glucose, Bld 92 70 - 99 mg/dL   BUN 11 6 - 20 mg/dL   Creatinine, Ser 1.10 (H) 0.44 - 1.00 mg/dL   Calcium 7.5 (L) 8.9 - 10.3 mg/dL   GFR calc non Af Amer 57 (L) >60 mL/min   GFR calc Af Amer >60 >60 mL/min    Comment: (NOTE) The eGFR has been calculated using the CKD EPI equation. This calculation has not been validated in all clinical situations. eGFR's persistently <60 mL/min signify possible Chronic Kidney Disease.    Anion gap 6 5 - 15    Comment: Performed at [facility] [place] [id number] CBC  Status: Abnormal   Collection Time: 10/23/17  5:24 AM  Result Value Ref Range   WBC 3.5 (L) 4.0 - 10.5 K/uL   RBC 2.39 (L) 3.87 - 5.11 MIL/uL   Hemoglobin 7.7 (L) 12.0 - 15.0 g/dL   HCT 25.6 (L) 36.0 - 46.0 %   MCV 107.1 (H) 78.0 - 100.0 fL   MCH 32.2 26.0 - 34.0 pg   MCHC 30.1 30.0 - 36.0 g/dL   RDW 18.6 (H) 11.5 - 15.5 %   Platelets 168 150 - 400  K/uL    Comment: Performed at [facility] [address], [place], [place] [address]   *Note: Due to a large number of results and/or encounters for the requested time period, some results have not been displayed. A complete set of results can be found in Results Review.     Constitutional: No distress . Vital signs reviewed. HEENT: EOMI, oral membranes moist Neck: supple Cardiovascular: RRR without murmur. No JVD    Respiratory: CTA Bilaterally without wheezes or rales. Normal effort    GI: BS +, non-tender, non-distended  Neuro: alert and oriented  Motor: bilateral upper extremity 5/5 proximal distal Bilateral lower extremities 0/5 proximal to distal, sensory level at lower sternum---no motor or sensory changes. Tone 2-3/4 in LE's Musc/Skel:  No edema or tenderness in extremities Psych:pleasant Skin: Warm and dry   Assessment/Plan: 1. Functional deficits secondary to T3 paraplegia which require 3+ hours per day of interdisciplinary therapy in a comprehensive inpatient rehab setting. Physiatrist is providing close team supervision and 24 hour management of active medical problems listed below. Physiatrist and rehab team continue to assess barriers to discharge/monitor patient progress toward functional and medical goals. FIM: Function - Bathing Position: Wheelchair/chair at sink Body parts bathed by patient: Right arm, Left arm, Chest, Abdomen(UB only) Body parts bathed by helper: Back Bathing not applicable: Front perineal area, Buttocks Assist Level: Set up, Supervision or verbal cues Assistive Device Comment: LH sponge and reacher Set up : To obtain items  Function- Upper Body Dressing/Undressing What is the patient wearing?: Pull over shirt/dress, Bra Bra - Perfomed by patient: Thread/unthread right bra strap, Thread/unthread left bra strap, Hook/unhook bra (pull down sports bra) Bra - Perfomed by helper: Hook/unhook bra (pull down sports bra) Pull over shirt/dress  - Perfomed by patient: Thread/unthread right sleeve, Thread/unthread left sleeve, Put head through opening, Pull shirt over trunk Pull over shirt/dress - Perfomed by helper: Pull shirt over trunk Assist Level: Set up, Supervision or verbal cues Set up : To obtain clothing/put away Function - Lower Body Dressing/Undressing What is the patient wearing?: Pants, Maryln Manuel, Shoes Position: Bed Pants- Performed by patient: Thread/unthread right pants leg, Thread/unthread left pants leg, Pull pants up/down Pants- Performed by helper: Pull pants up/down Shoes - Performed by helper: Don/doff right shoe, Don/doff left shoe, Fasten right, Fasten left TED Hose - Performed by helper: Don/doff right TED hose, Don/doff left TED hose Assist for footwear: Dependant Assist for lower body dressing: Touching or steadying assistance (Pt > 75%)  Function - Toileting Toileting activity did not occur: No continent bowel/bladder event Toileting steps completed by patient: Adjust clothing prior to toileting, Performs perineal hygiene, Adjust clothing after toileting Toileting steps completed by helper: Adjust clothing prior to toileting, Performs perineal hygiene, Adjust clothing after toileting Assist level: Two helpers  Function - Air cabin crew transfer activity did not occur: Safety/medical concerns Toilet transfer assistive device: Bedside commode Assist level to toilet: 2 helpers(per Wyvonnia Lora,  NT) Assist level from toilet: 2 helpers Assist level to bedside commode (at bedside): Maximal assist (Pt 25 - 49%/lift and lower) Assist level from bedside commode (at bedside): 2 helpers  Function - Chair/bed transfer Chair/bed transfer activity did not occur: N/A Chair/bed transfer method: Lateral scoot Chair/bed transfer assist level: Maximal assist (Pt 25 - 49%/lift and lower) Chair/bed transfer assistive device: Sliding board Chair/bed transfer details: Manual facilitation for placement, Manual  facilitation for weight shifting, Tactile cues for sequencing, Tactile cues for weight shifting, Verbal cues for safe use of DME/AE  Function - Locomotion: Wheelchair Will patient use wheelchair at discharge?: Yes Type: Manual Max wheelchair distance: 300 Assist Level: No help, No cues, assistive device, takes more than reasonable amount of time Wheel 50 feet with 2 turns activity did not occur: Safety/medical concerns Assist Level: No help, No cues, assistive device, takes more than reasonable amount of time Wheel 150 feet activity did not occur: Safety/medical concerns Assist Level: No help, No cues, assistive device, takes more than reasonable amount of time Turns around,maneuvers to table,bed, and toilet,negotiates 3% grade,maneuvers on rugs and over doorsills: Yes Function - Locomotion: Ambulation Ambulation activity did not occur: Safety/medical concerns Walk 10 feet activity did not occur: Safety/medical concerns Walk 50 feet with 2 turns activity did not occur: Safety/medical concerns Walk 150 feet activity did not occur: Safety/medical concerns Walk 10 feet on uneven surfaces activity did not occur: Safety/medical concerns  Function - Comprehension Comprehension: Auditory Comprehension assist level: Understands complex 90% of the time/cues 10% of the time  Function - Expression Expression: Verbal Expression assist level: Expresses complex 90% of the time/cues < 10% of the time  Function - Social Interaction Social Interaction assist level: Interacts appropriately 75 - 89% of the time - Needs redirection for appropriate language or to initiate interaction.  Function - Problem Solving Problem solving assist level: Solves basic 90% of the time/requires cueing < 10% of the time  Function - Memory Memory assist level: Recognizes or recalls 90% of the time/requires cueing < 10% of the time Patient normally able to recall (first 3 days only): Current season, Location of own room,  Staff names and faces, That he or she is in a hospital   Medical Problem List and Plan: 1.  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  Cont CIR  -SNF pending  -for thoracic CT myelogram tomorrow 2.  DVT Prophylaxis/Anticoagulation/history of pulmonary emboli: Xarelto.  Monitor for any bleeding episodes 3. Pain Management/spasticity:   -continue baclofen 84m QID    -continue tramadol for more severe pain  -continue tylenol prn  -Stopped tizanidine 4. Mood: Zoloft 25 mg daily.  Provide emotional support 5. Neuropsych: This patient is capable of making decisions on her own behalf. 6. Skin/Wound Care:  Left knee incision well healed 7. Fluids/Electrolytes/Nutrition:   -Recheck labs tomorrow 8.  Left femoral shaft fracture.  Status post intramedullary retrograde nailing 09/11/2017.  WBAT per ortho 9.  Acute blood loss anemia/pancytopenia.    Hemoglobin 7.4 on 7/29, up to 7.7 8/2  Recheck Monday 10.  Hyperglycemia secondary to steroid.  improved  -dc'ed cbg checks and SSI 11.  Neurogenic bowel/ bladder.    - PM bowel program with some improvement  -Stop Ditropan  -100k GNR   bactrim DS completed    12.  Hypothyroidism.  Synthroid 13. Thrombocytopenia -Resolved 14.  Sinus symptoms improved-  = flonase, added ocean nasal  spray  claritin   LOS (Days) 34 A FACE TO FACE EVALUATION WAS PERFORMED  [name] 10/25/2017, 7:32 AM

## 2017-10-25 NOTE — Progress Notes (Signed)
Physical Therapy Session Note  Patient Details  Name: Sarah [name] MRN: [id number] Date of Birth: October 06, 1965  Today's Date: 10/25/2017 PT Individual Time: 7185-5015 PT Individual Time Calculation (min): 56 min   Short Term Goals: Week 5:  PT Short Term Goal 1 (Week 5): =LTGs due to ELOS  Skilled Therapeutic Interventions/Progress Updates:    no c/o pain.  PT noted pt's LEs to be heavily edematous, so removed leg loops and provided therapeutic massage for edema management as well as encouraged pt to transfer back to bed with nursing at end of session and elevate LEs.  Pt propels w/c throughout unit mod I.  Slide board transfer to therapy mat on pt's R side with overall mod assist, pt completes 70% of transfer.  Sit<>supine with mod assist to manage LEs in both directions, wedge under pt's trunk for improved breath control.  PT provided PROM stretching 3x20 seconds for hamstrings, hip adductors, IR/ER bilaterally.  Pt returned to w/c at end of session with min assist for slide board transfer to L side.  Pt returned to room at end of session and positioned upright in w/c with call bell in reach and needs met.   Therapy Documentation Precautions:  Precautions Precautions: Fall Precaution Comments: throracic region, LEs spasicity  Restrictions Weight Bearing Restrictions: Yes LLE Weight Bearing: Touchdown weight bearing Other Position/Activity Restrictions: B LEs(Order updated 10/14/17)   See Function Navigator for Current Functional Status.   Therapy/Group: Individual Therapy  Michel Santee 10/25/2017, 11:07 AM

## 2017-10-26 ENCOUNTER — Inpatient Hospital Stay (HOSPITAL_COMMUNITY): Payer: Medicare Other

## 2017-10-26 ENCOUNTER — Ambulatory Visit
Admit: 2017-10-26 | Discharge: 2017-10-26 | Disposition: A | Discharge status: Home / Self Care | Payer: Medicare Other | Source: Ambulatory Visit | Attending: Radiation Oncology | Admitting: Radiation Oncology

## 2017-10-26 DIAGNOSIS — C7951 Secondary malignant neoplasm of bone: Secondary | ICD-10-CM

## 2017-10-26 DIAGNOSIS — C73 Malignant neoplasm of thyroid gland: Secondary | ICD-10-CM | POA: Diagnosis not present

## 2017-10-26 DIAGNOSIS — Z510 Encounter for antineoplastic radiation therapy: Secondary | ICD-10-CM | POA: Insufficient documentation

## 2017-10-26 DIAGNOSIS — C7931 Secondary malignant neoplasm of brain: Secondary | ICD-10-CM | POA: Diagnosis not present

## 2017-10-26 LAB — COMPREHENSIVE METABOLIC PANEL
ALK PHOS: 233 U/L — AB (ref 38–126)
ALT: 8 U/L (ref 0–44)
ANION GAP: 6 (ref 5–15)
AST: 18 U/L (ref 15–41)
Albumin: 1.9 g/dL — ABNORMAL LOW (ref 3.5–5.0)
BILIRUBIN TOTAL: 1.1 mg/dL (ref 0.3–1.2)
BUN: 9 mg/dL (ref 6–20)
CO2: 26 mmol/L (ref 22–32)
CREATININE: 0.88 mg/dL (ref 0.44–1.00)
Calcium: 7.6 mg/dL — ABNORMAL LOW (ref 8.9–10.3)
Chloride: 111 mmol/L (ref 98–111)
GFR calc non Af Amer: >60 mL/min (ref >60)
Glucose, Bld: 90 mg/dL (ref 70–99)
Potassium: 3.8 mmol/L (ref 3.5–5.1)
SODIUM: 143 mmol/L (ref 135–145)
Total Protein: 4.6 g/dL — ABNORMAL LOW (ref 6.5–8.1)

## 2017-10-26 LAB — CBC
HEMATOCRIT: 26.5 % — AB (ref 36.0–46.0)
HEMOGLOBIN: 8 g/dL — AB (ref 12.0–15.0)
MCH: 31.9 pg (ref 26.0–34.0)
MCHC: 30.2 g/dL (ref 30.0–36.0)
MCV: 105.6 fL — AB (ref 78.0–100.0)
Platelets: 156 10*3/uL (ref 150–400)
RBC: 2.51 MIL/uL — ABNORMAL LOW (ref 3.87–5.11)
RDW: 18.2 % — ABNORMAL HIGH (ref 11.5–15.5)
WBC: 3.2 10*3/uL — ABNORMAL LOW (ref 4.0–10.5)

## 2017-10-26 MED ORDER — LIDOCAINE HCL (PF) 1 % IJ SOLN
5.0000 mL | Freq: Once | INTRAMUSCULAR | Status: AC
  Administered 2017-10-26: 5 mL via INTRADERMAL

## 2017-10-26 MED ORDER — ONDANSETRON HCL 4 MG/2ML IJ SOLN
INTRAMUSCULAR | Status: AC
  Administered 2017-10-26: 4 mg via INTRAVENOUS
  Filled 2017-10-26: qty 2

## 2017-10-26 MED ORDER — IOPAMIDOL (ISOVUE-M 300) INJECTION 61%
15.0000 mL | Freq: Once | INTRAMUSCULAR | Status: AC | PRN
  Administered 2017-10-26: 12 mL via INTRATHECAL

## 2017-10-26 NOTE — Progress Notes (Signed)
Subjective/Complaints: Feels better with Zanaflex discontinued.  Tolerated the increase of baclofen to 15 mg 4 times daily.  Feels that it may have helped somewhat with her intermittent catheterization  ROS: Patient denies fever, rash, sore throat, blurred vision, nausea, vomiting, diarrhea, cough, shortness of breath or chest pain, joint or back pain, headache, or mood change.    Objective: Vital Signs: Blood pressure 113/65, pulse 67, temperature (!) 97.5 F (36.4 C), temperature source Oral, resp. rate 18, height 5' 11"  (1.803 m), weight 103.2 kg (227 lb 9.8 oz), last menstrual period 10/22/2016, SpO2 97 %. No results found. Results for orders placed or performed during the hospital encounter of 09/21/17 (from the past 72 hour(s))  CBC     Status: Abnormal   Collection Time: 10/26/17  5:55 AM  Result Value Ref Range   WBC 3.2 (L) 4.0 - 10.5 K/uL   RBC 2.51 (L) 3.87 - 5.11 MIL/uL   Hemoglobin 8.0 (L) 12.0 - 15.0 g/dL   HCT 26.5 (L) 36.0 - 46.0 %   MCV 105.6 (H) 78.0 - 100.0 fL   MCH 31.9 26.0 - 34.0 pg   MCHC 30.2 30.0 - 36.0 g/dL   RDW 18.2 (H) 11.5 - 15.5 %   Platelets 156 150 - 400 K/uL    Comment: Performed at [facility], [place] [address] [place], [place] [address]  Comprehensive metabolic panel     Status: Abnormal   Collection Time: 10/26/17  5:55 AM  Result Value Ref Range   Sodium 143 135 - 145 mmol/L   Potassium 3.8 3.5 - 5.1 mmol/L   Chloride 111 98 - 111 mmol/L   CO2 26 22 - 32 mmol/L   Glucose, Bld 90 70 - 99 mg/dL   BUN 9 6 - 20 mg/dL   Creatinine, Ser 0.88 0.44 - 1.00 mg/dL   Calcium 7.6 (L) 8.9 - 10.3 mg/dL   Total Protein 4.6 (L) 6.5 - 8.1 g/dL   Albumin 1.9 (L) 3.5 - 5.0 g/dL   AST 18 15 - 41 U/L   ALT 8 0 - 44 U/L   Alkaline Phosphatase 233 (H) 38 - 126 U/L   Total Bilirubin 1.1 0.3 - 1.2 mg/dL   GFR calc non Af Amer >60 >60 mL/min   GFR calc Af Amer >60 >60 mL/min    Comment: (NOTE) The eGFR has been calculated using the CKD EPI  equation. This calculation has not been validated in all clinical situations. eGFR's persistently <60 mL/min signify possible Chronic Kidney Disease.    Anion gap 6 5 - 15    Comment: Performed at [facility] [address], [place], [place] [address]   *Note: Due to a large number of results and/or encounters for the requested time period, some results have not been displayed. A complete set of results can be found in Results Review.     Constitutional: No distress . Vital signs reviewed. HEENT: EOMI, oral membranes moist Neck: supple Cardiovascular: RRR without murmur. No JVD    Respiratory: CTA Bilaterally without wheezes or rales. Normal effort    GI: BS +, non-tender, non-distended  Neuro: very alert  Motor: bilateral upper extremity 5/5 proximal distal Bilateral lower extremities 0/5 proximal to distal, sensory level at lower sternum---no motor or sensory changes. Tone 2-3/4 in LE's--no tone change Musc/Skel:  No edema or tenderness in extremities Psych:pleasant Skin: Warm and dry   Assessment/Plan: 1. Functional deficits secondary to T3 paraplegia which require 3+ hours  per day of interdisciplinary therapy in a comprehensive inpatient rehab setting. Physiatrist is providing close team supervision and 24 hour management of active medical problems listed below. Physiatrist and rehab team continue to assess barriers to discharge/monitor patient progress toward functional and medical goals. FIM: Function - Bathing Position: Wheelchair/chair at sink Body parts bathed by patient: Right arm, Left arm, Chest, Abdomen(UB only) Body parts bathed by helper: Back Bathing not applicable: Front perineal area, Buttocks Assist Level: Set up, Supervision or verbal cues Assistive Device Comment: LH sponge and reacher Set up : To obtain items  Function- Upper Body Dressing/Undressing What is the patient wearing?: Pull over shirt/dress, Bra Bra - Perfomed by patient:  Thread/unthread right bra strap, Thread/unthread left bra strap, Hook/unhook bra (pull down sports bra) Bra - Perfomed by helper: Hook/unhook bra (pull down sports bra) Pull over shirt/dress - Perfomed by patient: Thread/unthread right sleeve, Thread/unthread left sleeve, Put head through opening, Pull shirt over trunk Pull over shirt/dress - Perfomed by helper: Pull shirt over trunk Assist Level: Set up, Supervision or verbal cues Set up : To obtain clothing/put away Function - Lower Body Dressing/Undressing What is the patient wearing?: Pants, Maryln Manuel, Shoes Position: Bed Pants- Performed by patient: Thread/unthread right pants leg, Thread/unthread left pants leg, Pull pants up/down Pants- Performed by helper: Pull pants up/down Shoes - Performed by helper: Don/doff right shoe, Don/doff left shoe, Fasten right, Fasten left TED Hose - Performed by helper: Don/doff right TED hose, Don/doff left TED hose Assist for footwear: Dependant Assist for lower body dressing: Touching or steadying assistance (Pt > 75%)  Function - Toileting Toileting activity did not occur: No continent bowel/bladder event Toileting steps completed by patient: Adjust clothing prior to toileting, Performs perineal hygiene, Adjust clothing after toileting Toileting steps completed by helper: Adjust clothing after toileting, Performs perineal hygiene, Adjust clothing prior to toileting Assist level: Two helpers  Function - Air cabin crew transfer activity did not occur: Safety/medical concerns Toilet transfer assistive device: Bedside commode Assist level to toilet: 2 helpers(per Wyvonnia Lora, NT) Assist level from toilet: 2 helpers Assist level to bedside commode (at bedside): Maximal assist (Pt 25 - 49%/lift and lower) Assist level from bedside commode (at bedside): 2 helpers  Function - Chair/bed transfer Chair/bed transfer activity did not occur: N/A Chair/bed transfer method: Lateral scoot Chair/bed  transfer assist level: Moderate assist (Pt 50 - 74%/lift or lower)(min<>mod) Chair/bed transfer assistive device: Sliding board Chair/bed transfer details: Manual facilitation for placement, Manual facilitation for weight shifting, Tactile cues for sequencing, Tactile cues for weight shifting, Verbal cues for safe use of DME/AE  Function - Locomotion: Wheelchair Will patient use wheelchair at discharge?: Yes Type: Manual Max wheelchair distance: 150 Assist Level: No help, No cues, assistive device, takes more than reasonable amount of time Wheel 50 feet with 2 turns activity did not occur: Safety/medical concerns Assist Level: No help, No cues, assistive device, takes more than reasonable amount of time Wheel 150 feet activity did not occur: Safety/medical concerns Assist Level: No help, No cues, assistive device, takes more than reasonable amount of time Turns around,maneuvers to table,bed, and toilet,negotiates 3% grade,maneuvers on rugs and over doorsills: Yes Function - Locomotion: Ambulation Ambulation activity did not occur: Safety/medical concerns Walk 10 feet activity did not occur: Safety/medical concerns Walk 50 feet with 2 turns activity did not occur: Safety/medical concerns Walk 150 feet activity did not occur: Safety/medical concerns Walk 10 feet on uneven surfaces activity did not occur: Safety/medical concerns  Function -  Comprehension Comprehension: Auditory Comprehension assist level: Follows complex conversation/direction with extra time/assistive device  Function - Expression Expression: Verbal Expression assist level: Expresses complex ideas: With extra time/assistive device  Function - Social Interaction Social Interaction assist level: Interacts appropriately with others with medication or extra time (anti-anxiety, antidepressant).  Function - Problem Solving Problem solving assist level: Solves basic problems with no assist  Function - Memory Memory assist  level: More than reasonable amount of time Patient normally able to recall (first 3 days only): Current season, Location of own room, Staff names and faces, That he or she is in a hospital   Medical Problem List and Plan: 1.  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  Cont CIR  -SNF pending  -for thoracic CT myelogram today 2.  DVT Prophylaxis/Anticoagulation/history of pulmonary emboli: Xarelto.  Monitor for any bleeding episodes 3. Pain Management/spasticity:   -continue baclofen 91m QID, consider increase to 248mQID tomorrow   -continue tramadol for more severe pain  -continue tylenol prn  -Stopped tizanidine 4. Mood: Zoloft 25 mg daily.  Provide emotional support 5. Neuropsych: This patient is capable of making decisions on her own behalf. 6. Skin/Wound Care:  Left knee incision well healed 7. Fluids/Electrolytes/Nutrition:   -I personally reviewed all of the patient's labs today, and lab work is within normal limits. 8.  Left femoral shaft fracture.  Status post intramedullary retrograde nailing 09/11/2017.  WBAT per ortho 9.  Acute blood loss anemia/pancytopenia.    Hemoglobin up to 8.0 8/5    10.  Hyperglycemia secondary to steroid.  improved  -dc'ed cbg checks and SSI 11.  Neurogenic bowel/ bladder.    - PM bowel program with some improvement  -Stop Ditropan  -100k GNR   bactrim DS completed  -place foley at d/c  12.  Hypothyroidism.  Synthroid 13. Thrombocytopenia -Resolved 14.  Sinus symptoms improved-  = flonase, added ocean nasal spray  claritin   LOS (Days) 35 A FACE TO FACE EVALUATION WAS PERFORMED  Sarah [name]/07/2017, 8:53 AM

## 2017-10-26 NOTE — Procedures (Signed)
Lumbar puncture for thoracic myelogram performed at L2-3 using a 22 gauge needle with clear CSF return and free flow of contrast within subarachnoid space. 10 mL of Isovue-M 300 injected without immediate complication. Myelographic block in upper thoracic spine. CT to follow.

## 2017-10-26 NOTE — Progress Notes (Signed)
Social Work Patient ID: Sarah [name], female   DOB: [birth year], 52 y.o.   MRN: [id number]  Have arranged for [facility] to pick up pt ~12noon to transport to Radiation Oncology today.  Kewan Mcnease, LCSW

## 2017-10-26 NOTE — Progress Notes (Signed)
Patient NPO after MN for Myelogram and informed, TBA

## 2017-10-27 ENCOUNTER — Inpatient Hospital Stay (HOSPITAL_COMMUNITY): Payer: Medicare Other | Admitting: Occupational Therapy

## 2017-10-27 ENCOUNTER — Inpatient Hospital Stay (HOSPITAL_COMMUNITY): Payer: Medicare Other | Admitting: Physical Therapy

## 2017-10-27 MED ORDER — BACLOFEN 20 MG PO TABS
20.0000 mg | ORAL_TABLET | Freq: Three times a day (TID) | ORAL | Status: DC
  Administered 2017-10-27 – 2017-10-30 (×9): 20 mg via ORAL
  Filled 2017-10-27 (×9): qty 1

## 2017-10-27 NOTE — Patient Care Conference (Signed)
Inpatient RehabilitationTeam Conference and Plan of Care Update Date: 10/27/2017   Time: 2:00 PM    Patient Name: Sarah [name]      Medical Record Number: [id number]  Date of Birth: 03-18-1966 Sex: Female         Room/Bed: 4W18C/4W18C-01 Payor Info: Payor: [employer] MEDICARE / Plan: [facility] MEDICARE / Product Type: *No Product type* /    Admitting Diagnosis: T4 Para  Admit Date/Time:  09/21/2017  4:57 PM Admission Comments: No comment available   Primary Diagnosis:  <principal problem not specified> Principal Problem: <principal problem not specified>  Patient Active Problem List   Diagnosis Date Noted  . Fracture   . Acute lower UTI   . Myelopathy ([place]) 09/21/2017  . Muscle spasms of both lower extremities   . Closed fracture of shaft of left femur ([place])   . Acute blood loss anemia   . Hypothyroidism   . Nausea & vomiting 09/15/2017  . Closed fracture of left distal femur ([place]) 09/11/2017  . Paraplegia ([employer]) 09/08/2017  . Obesity (BMI 30-39.9) 09/03/2017  . Status post surgery 09/03/2017  . Calculus of gallbladder without cholecystitis without obstruction   . Biliary dyskinesia   . Intractable nausea and vomiting 08/09/2017  . Thrombocytopenia ([place]) 08/09/2017  . Elevated bilirubin 08/09/2017  . AKI (acute kidney injury) ([place]) 08/09/2017  . Nasal congestion 04/05/2017  . Breast pain 01/26/2017  . Upper respiratory infection, viral 01/26/2017  . Counseling regarding advanced care planning and goals of care 01/02/2017  . Bilateral rotator cuff syndrome 02/12/2016  . E. coli UTI 08/01/2015  . Constipation due to neurogenic bowel 08/01/2015  . Muscle spasticity   . 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 Spastic paraparesis   . Neurogenic bladder   . History of pulmonary embolism   . Post-operative pain   . Metastatic cancer ([place])   . Steroid-induced hyperglycemia   . Depression   . Obstipation   . Thoracic spine tumor 07/06/2015  . 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 Spinal cord compression due to malignant neoplasm metastatic to spine ([place])   . Postoperative hypothyroidism   . 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 Spasticity 09/19/2014  . Dysuria 09/04/2014  . Hypertension 05/01/2014  . Ingrown right big toenail 05/01/2014  . GERD (gastroesophageal reflux disease) 02/14/2014  . Dysphagia, pharyngoesophageal phase 02/06/2014  . Type 2 diabetes mellitus without complication ([employer]) 10/62/6948  . Constipation   . Neurogenic bowel   . Paraplegia at T4 level ([place]) 01/20/2014  . Metastatic cancer to spine ([facility]) 01/19/2014  . Rotator cuff tendonitis   . 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 Leg weakness, bilateral 12/13/2013    Expected Discharge Date: Expected Discharge Date: (SNF)  Team Members Present: Physician leading conference: Dr. Alger Simons Nurse Present: Rayetta Humphrey, RN PT Present: Dwyane Dee, PT OT Present: Benay Pillow, OT     Current Status/Progress Goal Weekly Team Focus  Medical   for thoracic myelogram today as part of rad-onc plan. increased tone  see prior  increasing medications for spasticity, rad-onc plan per cancer team   Bowel/Bladder   Bladder/Bowel remain incontinent with I/O catherization of bladder and incont. care provided,Cont. bowel program with suppossitory and digital stim daily, assess QS  able to manage bowel and bladder with mod assistance  QS and PRN assessment of tolieting needs and current regime as ordered    Swallow/Nutrition/  Hydration             ADL's   Max A slideboard transfers, setup UB self care, Min A LB self care, bathing from bedlevel with Min A   min A bathing,LB self care ; max toileting/toilet transfer, and supervision/set up for all other goals  pt education, functional transfers, strengthening, ADL retraining, core strengthening, endurance    Mobility   Mod  I w/c management, min A bed mobiltiy to leg loops, mod A for slide board transfers fading to max A in AM  bed mobility min A, bed > chair transfers mod A, and Mod I for all w/c propulsion  transfers with slide board, bed mobility with leg loops, w/c propulsion, activity tolerance, UE strengthening, dynamic sitting balance, flexibility   Communication             Safety/Cognition/ Behavioral Observations            Pain   prn for upper back pain with Tylenol x1, verbalize relief  <2  QS assess and provide medication and prn thrapy s needed   Skin   Surgical incision unremarkable no sign of infection , no significant changes noted  Remain free from infection and skin breakdown  QS/PRN assessment       *See Care Plan and progress notes for long and short-term goals.     Barriers to Discharge  Current Status/Progress Possible Resolutions Date Resolved   Physician    Medical stability        finalize onc plan as above      Nursing                  PT                    OT                  SLP                SW                Discharge Planning/Teaching Needs:  Plan has changed to SNF as family cannot meet current care needs nor provide recommended 24/7 care.  NA   Team Discussion:  No medical concerns - myelogram and CT sim done yesterday.  No significant change in function with therapies.  MD states ready to restart SNF bed search.  Revisions to Treatment Plan:  None    Continued Need for Acute Rehabilitation Level of Care: The patient requires daily medical management by a physician with specialized training in physical medicine and rehabilitation for the following conditions: Daily direction of a multidisciplinary physical rehabilitation program to ensure safe treatment while eliciting the highest outcome that is of practical value to the patient.: Yes Daily medical management of patient stability for increased activity during participation in an intensive rehabilitation  regime.: Yes Daily analysis of laboratory values and/or radiology reports with any subsequent need for medication adjustment of medical intervention for : Neurological problems;Other  Tremane Spurgeon 10/27/2017, 3:52 PM

## 2017-10-27 NOTE — Progress Notes (Signed)
Social Work Patient ID: Sarah [name], female   DOB: [birth year], 52 y.o.   MRN: [id number]  Have reviewed team conference with pt and family.  All aware team feels pt ready for SNF transfer.  Pt with a couple of facility preferences that we discussed.  FL2 out and will keep pt/family/team posted on bed offers.  Sarah Mishra, LCSW

## 2017-10-27 NOTE — Progress Notes (Signed)
Sarah [name] at [facility] was called to clarify about bed rest orders from yesterday.Pt. Is okay to be out of bed as per the cancer center,it was only for yesterday.

## 2017-10-27 NOTE — NC FL2 (Signed)
[place] LEVEL OF CARE SCREENING TOOL     IDENTIFICATION  Patient Name: Sarah [name] Birthdate: December 19, 1965 Sex: female Admission Date (Current Location): 09/21/2017  [facility] and [place] Number:  Herbalist and Address:  [facility], [place] [address] [place], [place] [address]      Provider Number: [id number]  Attending Physician Name and Address:  [name], MD  Relative Name and Phone Number:       Current Level of Care: Other (Comment)(Acute Inpatient Rehab Program) Recommended Level of Care: [place] Prior Approval Number:    Date Approved/Denied:   PASRR Number: [id number]  Discharge Plan: SNF    Current Diagnoses: Patient Active Problem List   Diagnosis Date Noted  . Fracture   . Acute lower UTI   . Myelopathy ([place]) 09/21/2017  . Muscle spasms of both lower extremities   . Closed fracture of shaft of left femur ([place])   . Acute blood loss anemia   . Hypothyroidism   . Nausea & vomiting 09/15/2017  . Closed fracture of left distal femur ([place]) 09/11/2017  . Paraplegia ([place]) 09/08/2017  . Obesity (BMI 30-39.9) 09/03/2017  . Status post surgery 09/03/2017  . Calculus of gallbladder without cholecystitis without obstruction   . Biliary dyskinesia   . Intractable nausea and vomiting 08/09/2017  . Thrombocytopenia ([place]) 08/09/2017  . Elevated bilirubin 08/09/2017  . AKI (acute kidney injury) ([place]) 08/09/2017  . Nasal congestion 04/05/2017  . Breast pain 01/26/2017  . Upper respiratory infection, viral 01/26/2017  . Counseling regarding advanced care planning and goals of care 01/02/2017  . Bilateral rotator cuff syndrome 02/12/2016  . E. coli UTI 08/01/2015  . Constipation due to neurogenic bowel 08/01/2015  . Muscle spasticity   . 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 Spastic paraparesis   . Neurogenic bladder   . History of pulmonary embolism   . Post-operative pain   . Metastatic  cancer ([place])   . Steroid-induced hyperglycemia   . Depression   . Obstipation   . Thoracic spine tumor 07/06/2015  . 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 ([place]) 07/06/2015  . Spinal cord compression due to malignant neoplasm metastatic to spine ([place])   . Postoperative hypothyroidism   . 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 Spasticity 09/19/2014  . Dysuria 09/04/2014  . Hypertension 05/01/2014  . Ingrown right big toenail 05/01/2014  . GERD (gastroesophageal reflux disease) 02/14/2014  . Dysphagia, pharyngoesophageal phase 02/06/2014  . Type 2 diabetes mellitus without complication ([place]) 56/21/3086  . Constipation   . Neurogenic bowel   . Paraplegia at T4 level ([place]) 01/20/2014  . Metastatic cancer to spine ([place]) 01/19/2014  . Rotator cuff tendonitis   . 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 Leg weakness, bilateral 12/13/2013    Orientation RESPIRATION BLADDER Height & Weight     Self, Time, Situation, Place  Normal Indwelling catheter Weight: 103.2 kg (227 lb 9.8 oz) Height:  5\' 11"  (180.3 cm)  BEHAVIORAL SYMPTOMS/MOOD NEUROLOGICAL BOWEL NUTRITION STATUS      Incontinent(can be continent with planned bowel program) Diet  AMBULATORY STATUS COMMUNICATION OF NEEDS Skin   Total Care(wheelchair level mobility) Verbally Surgical wounds                       Personal Care Assistance Level of Assistance  Bathing, Dressing Bathing Assistance: Maximum assistance  Dressing Assistance: Maximum assistance     Functional Limitations Info             SPECIAL CARE FACTORS FREQUENCY  PT (By licensed PT), OT (By licensed OT)     PT Frequency: 5x/wk OT Frequency: x/wk            Contractures Contractures Info: Not present    Additional Factors Info  Code Status, Allergies Code Status Info: Full Allergies Info: See MAR           Current Medications (10/27/2017):   This is the current hospital active medication list Current Facility-Administered Medications  Medication Dose Route Frequency Provider Last Rate Last Dose  . acetaminophen (TYLENOL) tablet 650 mg  650 mg Oral Q6H PRN Cathlyn Parsons, PA-C   650 mg at 10/27/17 1452  . baclofen (LIORESAL) tablet 20 mg  20 mg Oral TID [name], MD   20 mg at 10/27/17 1453  . bisacodyl (DULCOLAX) suppository 10 mg  10 mg Rectal Q0600 [name], MD   10 mg at 10/26/17 1821  . famotidine (PEPCID) tablet 20 mg  20 mg Oral BID Cathlyn Parsons, PA-C   20 mg at 10/27/17 0844  . fluticasone (FLONASE) 50 MCG/ACT nasal spray 1 spray  1 spray Each Nare Q24H [name], MD   1 spray at 10/26/17 2136  . levothyroxine (SYNTHROID, LEVOTHROID) tablet 137 mcg  137 mcg Oral QAC breakfast Cathlyn Parsons, PA-C   137 mcg at 10/27/17 0630  . loratadine (CLARITIN) tablet 10 mg  10 mg Oral Daily Kirsteins, Luanna Salk, MD   10 mg at 10/27/17 0843  . magnesium hydroxide (MILK OF MAGNESIA) suspension 30 mL  30 mL Oral Daily PRN Bary Leriche, PA-C   30 mL at 10/15/17 1058  . ondansetron (ZOFRAN) tablet 4 mg  4 mg Oral Q6H PRN Cathlyn Parsons, PA-C   4 mg at 10/26/17 1821   Or  . ondansetron (ZOFRAN) injection 4 mg  4 mg Intravenous Q6H PRN Cathlyn Parsons, PA-C   4 mg at 10/26/17 1038  . ondansetron (ZOFRAN) injection 4 mg  4 mg Intramuscular Q6H PRN Kirsteins, Luanna Salk, MD   4 mg at 10/12/17 0540  . pantoprazole (PROTONIX) EC tablet 40 mg  40 mg Oral Daily Bary Leriche, PA-C   40 mg at 10/26/17 2134  . polyethylene glycol (MIRALAX / GLYCOLAX) packet 17 g  17 g Oral Daily [name], MD   17 g at 10/27/17 1453  . polyethylene glycol (MIRALAX / GLYCOLAX) packet 17 g  17 g Oral Daily PRN Love, Pamela S, PA-C      . rivaroxaban (XARELTO) tablet 20 mg  20 mg Oral Q supper Cathlyn Parsons, PA-C   Stopped at 10/24/17 1700  . senna (SENOKOT) tablet 8.6 mg  1 tablet Oral BID Bary Leriche, PA-C    8.6 mg at 10/27/17 0843  . sertraline (ZOLOFT) tablet 25 mg  25 mg Oral Daily Cathlyn Parsons, PA-C   25 mg at 10/27/17 0843  . sodium chloride (OCEAN) 0.65 % nasal spray 1 spray  1 spray Each Nare PRN Love, Pamela S, PA-C      . sodium phosphate (FLEET) 7-19 GM/118ML enema 1 enema  1 enema Rectal Daily PRN Angiulli, Lavon Paganini, PA-C      . sucralfate (CARAFATE) 1 GM/10ML suspension 1 g  1 g Oral TID WC & HS Love, Ivan Anchors, PA-C  1 g at 10/27/17 3094     Discharge Medications: Please see discharge summary for a list of discharge medications.  Relevant Imaging Results:  Relevant Lab Results:   Additional Information SS#  [id number]  Lennart Pall, LCSW

## 2017-10-27 NOTE — Progress Notes (Signed)
Physical Therapy Session Note  Patient Details  Name: Sarah [name] MRN: [id number] Date of Birth: 17-Aug-1965  Today's Date: 10/27/2017 PT Individual Time: 1330-1400 PT Individual Time Calculation (min): 30 min   Short Term Goals: Week 5:  PT Short Term Goal 1 (Week 5): =LTGs due to ELOS  Skilled Therapeutic Interventions/Progress Updates:    Pt missed first half of treatment session 2/2 I/O cathing. Pt reports no pain prior to session. L/R rolling using guard rails supervision to assist therapist with brief application. Therapist applies brief, dons pants, leg loops, shoes total A. Supine> sitting min A for leg loops with HOB elevated and inc time for task performance. Slide board transfer from bed> w/c mod A for initial lateral boost progressing to min A for decline transfer. Pt shifts weight posteriorly in chair with min A and VC's to maintain head hip relationship for efficiency. Pt left in sitting with tray table and call bell in reach and all needs met.   Therapy Documentation Precautions:  Precautions Precautions: Fall Precaution Comments: throracic region, LEs spasicity  Restrictions Weight Bearing Restrictions: Yes LLE Weight Bearing: Touchdown weight bearing Other Position/Activity Restrictions: B LEs(Order updated 10/14/17)   See Function Navigator for Current Functional Status.   Therapy/Group: Individual Therapy  Floreen Comber 10/27/2017, 2:24 PM

## 2017-10-27 NOTE — Progress Notes (Signed)
Occupational Therapy Session Note  Patient Details  Name: Sarah [name] MRN: [id number] Date of Birth: May 17, 1965  Today's Date: 10/27/2017 OT Individual Time: 4403-4742 and 1430-1601 OT Individual Time Calculation (min): 62 min and 91 min    Short Term Goals: Week 5:  OT Short Term Goal 1 (Week 5): STGs=LTGs due to ELOS  Skilled Therapeutic Interventions/Progress Updates:   Session 1: Upon entering the room, pt supine in bed with c/o not feeling well but agreeable to wash and change clothing. Pt performed tasks from bed level with set up A to wash UB and LB with use of LH sponge and reacher to increase independence with tasks. Pt donning pull over clothing with set up A to obtain items and needing assistance to fasten bra. OT placed TED hose on B LEs and pt practicing threading pants onto B feet with use of reacher but declining to don pants. Pt remained in bed at end of this session secondary to feeling unwell. Call bell and all needed items within reach. 15 missed minutes of OT intervention.   Session 2: Upon entering the room, pt seated in wheelchair awaiting OT arrival. Pt requesting needed items to wash hair at sink. Pt utilized wheelchair to obtain needed items from dresser to complete task with increased time. Pt propelled wheelchair 400' onto elevator and down to gift shop where she navigated aisles with mod I and turned around in tight corners without incidence. Pt carrying bought items in lap as well. Pt propelling wheelchair into [employer] and ordering food items from counter and paying independently as well. Pt returning to room with bought items at mod I level. Call bell and all needed items within reach upon exiting the room.    Therapy Documentation Precautions:  Precautions Precautions: Fall Precaution Comments: throracic region, LEs spasicity  Restrictions Weight Bearing Restrictions: Yes LLE Weight Bearing: Touchdown weight bearing Other Position/Activity  Restrictions: B LEs(Order updated 10/14/17) General: General OT Amount of Missed Time: 15 Minutes Vital Signs: Therapy Vitals Temp: 98.6 F (37 C) Temp Source: Oral Pulse Rate: 79 Resp: 18 BP: 107/74 Patient Position (if appropriate): Lying Oxygen Therapy SpO2: 98 % O2 Device: Room Air Pain:   ADL: ADL ADL Comments: see functional navigator Vision   Perception    Praxis   Exercises:   Other Treatments:    See Function Navigator for Current Functional Status.   Therapy/Group: Individual Therapy  [name] 10/27/2017, 10:37 AM

## 2017-10-27 NOTE — Progress Notes (Signed)
Subjective/Complaints: Pt lying flat in bed per bedrest orders from myelogram yesterday, back sore  ROS: Patient denies fever, rash, sore throat, blurred vision, nausea, vomiting, diarrhea, cough, shortness of breath or chest pain, joint  pain, headache, or mood change.    Objective: Vital Signs: Blood pressure 107/74, pulse 79, temperature 98.6 F (37 C), temperature source Oral, resp. rate 18, height 5' 11"  (1.803 m), weight 103.2 kg (227 lb 9.8 oz), last menstrual period 10/22/2016, SpO2 98 %. Ct Thoracic Spine W Contrast  Result Date: 10/26/2017 CLINICAL DATA:  Metastatic Hurthle cell thyroid cancer. FLUOROSCOPY TIME:  Radiation Exposure Index (as provided by the fluoroscopic device): 692.07 microGray*m^2 Fluoroscopy Time (in minutes and seconds):  1 minute 24 seconds PROCEDURE: LUMBAR PUNCTURE FOR THORACIC MYELOGRAM After thorough discussion of risks and benefits of the procedure including bleeding, infection, injury to nerves, blood vessels, adjacent structures as well as headache and CSF leak, written and oral informed consent was obtained. Consent was obtained by Dr. Logan Bores. Patient was positioned prone on the fluoroscopy table. Local anesthesia was provided with 1% lidocaine without epinephrine after prepped and draped in the usual sterile fashion. Puncture was performed at L2-3 using a 5 inch 22-gauge spinal needle via a right paramedian approach. Using a single pass through the dura, the needle was placed within the thecal sac, with return of clear CSF. 10 mL of Isovue M-300 was injected into the thecal sac, with normal opacification of the nerve roots and cauda equina consistent with free flow within the subarachnoid space. The patient was then moved to the trendelenburg position and contrast flowed into the Thoracic spine region. I personally performed the lumbar puncture and administered the intrathecal contrast. I also personally supervised acquisition of the myelogram images.  TECHNIQUE: Contiguous axial images were obtained through the Thoracic spine after the intrathecal infusion of infusion. Coronal and sagittal reconstructions were obtained of the axial image sets. COMPARISON:  Noncontrast thoracic spine CT 10/23/2017. Thoracic and lumbar spine MRI 09/02/2017. FINDINGS: THORACIC MYELOGRAM FINDINGS: The spinal canal is widely patent in the lower thoracic spine. A ventral extradural defect at T7-8 results in moderate spinal stenosis. Sequelae of T1-T5 posterior fusion are again identified. Contrast was not seen to extend superior to the T4 level despite significant Trendelenburg positioning with the patient prone and in a left lateral decubitus position. CT THORACIC MYELOGRAM FINDINGS: Mildly exaggerated kyphosis is again noted in the upper thoracic spine. There is mild right convex curvature of the thoracic spine without significant listhesis. Sequelae of T1-T5 posterior fusion are again identified with pedicle screws remaining in place at T1, T2, T4, and T5. A chronic pathologic T3 fracture is noted with unchanged vertebra plana configuration. Lytic lesions in the T2 and T4 vertebral bodies are unchanged from the recent prior CT with involvement of bone about both T4 screws. No new fracture is identified. Bridging anterior and lateral vertebral osteophytes are present from T2-L2. Intrathecal contrast is present above the level of the thoracic fusion despite not being visible on conventional myelographic images, although contrast in the lower cervical spine is very dilute. There is extensive soft tissue throughout the spinal canal from T2-T4 at the site of prior posterior decompression which results in complete effacement of the thecal sac at the T3 and upper T4 levels and presumably reflects recurrent epidural tumor. The thecal sac is narrowed in the transverse dimension at the T2 level. Nodular epidural soft tissue extends superiorly on the left as far as the T1 superior endplate  level with mild spinal stenosis at T1. There is also nodular epidural soft tissue extending inferiorly in the ventral aspect of the canal to the mid T5 level. Nodular dorsal epidural soft tissue is also present at T5 contributing to mild spinal stenosis. Dorsal epidural lipomatosis is noted in the midthoracic spine with mildly nodular appearance of the fat at T6. There is nodular soft tissue dorsally in the canal at L1 and L2, the largest focus measuring 7 mm in the midline at L1, which results in mild spinal stenosis and is new from the prior MRI. It is not clear whether this material is extradural, intradural, or possibly a combination of both. The visible proximal cauda equina nerve roots are not grossly thickened. A partially calcified central disc extrusion at T7-8 is unchanged from the prior MRI and results in moderate spinal stenosis with ventral cord flattening. A smaller calcified left paracentral disc protrusion at T8-9 is also unchanged and results in mild left-sided cord flattening without significant spinal stenosis. There is severe right facet arthrosis at T12-L1 roots results in mild right neural foraminal stenosis, unchanged. Coronary artery atherosclerosis, trace right and small left pleural effusions, and small volume assault fungemia fluid are noted. IMPRESSION: 1. 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 2. Epidural tumor extends superiorly to T1 and inferiorly to T5 with milder spinal stenosis. 3. New nodular soft tissue dorsally in the canal at L1 and L2 which results in mild spinal stenosis and presumably reflects tumor as well, though it is unclear if this is completely extradural or partially intradural as well. 4. Unchanged appearance of T1-T5 posterior fusion, severe T3 pathologic fracture, and T2-T4 osseous tumor compared to the recent noncontrast CT. Electronically Signed   By: Logan Bores M.D.    On: 10/26/2017 16:46   Dg Myelography Lumbar Inj Thoracic  Result Date: 10/26/2017 CLINICAL DATA:  Metastatic Hurthle cell thyroid cancer. FLUOROSCOPY TIME:  Radiation Exposure Index (as provided by the fluoroscopic device): 692.07 microGray*m^2 Fluoroscopy Time (in minutes and seconds):  1 minute 24 seconds PROCEDURE: LUMBAR PUNCTURE FOR THORACIC MYELOGRAM After thorough discussion of risks and benefits of the procedure including bleeding, infection, injury to nerves, blood vessels, adjacent structures as well as headache and CSF leak, written and oral informed consent was obtained. Consent was obtained by Dr. Logan Bores. Patient was positioned prone on the fluoroscopy table. Local anesthesia was provided with 1% lidocaine without epinephrine after prepped and draped in the usual sterile fashion. Puncture was performed at L2-3 using a 5 inch 22-gauge spinal needle via a right paramedian approach. Using a single pass through the dura, the needle was placed within the thecal sac, with return of clear CSF. 10 mL of Isovue M-300 was injected into the thecal sac, with normal opacification of the nerve roots and cauda equina consistent with free flow within the subarachnoid space. The patient was then moved to the trendelenburg position and contrast flowed into the Thoracic spine region. I personally performed the lumbar puncture and administered the intrathecal contrast. I also personally supervised acquisition of the myelogram images. TECHNIQUE: Contiguous axial images were obtained through the Thoracic spine after the intrathecal infusion of infusion. Coronal and sagittal reconstructions were obtained of the axial image sets. COMPARISON:  Noncontrast thoracic spine CT 10/23/2017. Thoracic and lumbar spine MRI 09/02/2017. FINDINGS: THORACIC MYELOGRAM FINDINGS: The spinal canal is widely patent in the lower thoracic spine. A ventral extradural defect at T7-8  results in moderate spinal stenosis. Sequelae of T1-T5  posterior fusion are again identified. Contrast was not seen to extend superior to the T4 level despite significant Trendelenburg positioning with the patient prone and in a left lateral decubitus position. CT THORACIC MYELOGRAM FINDINGS: Mildly exaggerated kyphosis is again noted in the upper thoracic spine. There is mild right convex curvature of the thoracic spine without significant listhesis. Sequelae of T1-T5 posterior fusion are again identified with pedicle screws remaining in place at T1, T2, T4, and T5. A chronic pathologic T3 fracture is noted with unchanged vertebra plana configuration. Lytic lesions in the T2 and T4 vertebral bodies are unchanged from the recent prior CT with involvement of bone about both T4 screws. No new fracture is identified. Bridging anterior and lateral vertebral osteophytes are present from T2-L2. Intrathecal contrast is present above the level of the thoracic fusion despite not being visible on conventional myelographic images, although contrast in the lower cervical spine is very dilute. There is extensive soft tissue throughout the spinal canal from T2-T4 at the site of prior posterior decompression which results in complete effacement of the thecal sac at the T3 and upper T4 levels and presumably reflects recurrent epidural tumor. The thecal sac is narrowed in the transverse dimension at the T2 level. Nodular epidural soft tissue extends superiorly on the left as far as the T1 superior endplate level with mild spinal stenosis at T1. There is also nodular epidural soft tissue extending inferiorly in the ventral aspect of the canal to the mid T5 level. Nodular dorsal epidural soft tissue is also present at T5 contributing to mild spinal stenosis. Dorsal epidural lipomatosis is noted in the midthoracic spine with mildly nodular appearance of the fat at T6. There is nodular soft tissue dorsally in the canal at L1 and L2, the largest focus measuring 7 mm in the midline at L1,  which results in mild spinal stenosis and is new from the prior MRI. It is not clear whether this material is extradural, intradural, or possibly a combination of both. The visible proximal cauda equina nerve roots are not grossly thickened. A partially calcified central disc extrusion at T7-8 is unchanged from the prior MRI and results in moderate spinal stenosis with ventral cord flattening. A smaller calcified left paracentral disc protrusion at T8-9 is also unchanged and results in mild left-sided cord flattening without significant spinal stenosis. There is severe right facet arthrosis at T12-L1 roots results in mild right neural foraminal stenosis, unchanged. Coronary artery atherosclerosis, trace right and small left pleural effusions, and small volume assault fungemia fluid are noted. IMPRESSION: 1. 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 2. Epidural tumor extends superiorly to T1 and inferiorly to T5 with milder spinal stenosis. 3. New nodular soft tissue dorsally in the canal at L1 and L2 which results in mild spinal stenosis and presumably reflects tumor as well, though it is unclear if this is completely extradural or partially intradural as well. 4. Unchanged appearance of T1-T5 posterior fusion, severe T3 pathologic fracture, and T2-T4 osseous tumor compared to the recent noncontrast CT. Electronically Signed   By: Logan Bores M.D.   On: 10/26/2017 16:46   Results for orders placed or performed during the hospital encounter of 09/21/17 (from the past 72 hour(s))  CBC     Status: Abnormal   Collection Time: 10/26/17  5:55 AM  Result Value Ref Range   WBC 3.2 (  L) 4.0 - 10.5 K/uL   RBC 2.51 (L) 3.87 - 5.11 MIL/uL   Hemoglobin 8.0 (L) 12.0 - 15.0 g/dL   HCT 26.5 (L) 36.0 - 46.0 %   MCV 105.6 (H) 78.0 - 100.0 fL   MCH 31.9 26.0 - 34.0 pg   MCHC 30.2 30.0 - 36.0 g/dL   RDW 18.2 (H) 11.5 - 15.5 %    Platelets 156 150 - 400 K/uL    Comment: Performed at [facility] [address] [place], [place] [address]  Comprehensive metabolic panel     Status: Abnormal   Collection Time: 10/26/17  5:55 AM  Result Value Ref Range   Sodium 143 135 - 145 mmol/L   Potassium 3.8 3.5 - 5.1 mmol/L   Chloride 111 98 - 111 mmol/L   CO2 26 22 - 32 mmol/L   Glucose, Bld 90 70 - 99 mg/dL   BUN 9 6 - 20 mg/dL   Creatinine, Ser 0.88 0.44 - 1.00 mg/dL   Calcium 7.6 (L) 8.9 - 10.3 mg/dL   Total Protein 4.6 (L) 6.5 - 8.1 g/dL   Albumin 1.9 (L) 3.5 - 5.0 g/dL   AST 18 15 - 41 U/L   ALT 8 0 - 44 U/L   Alkaline Phosphatase 233 (H) 38 - 126 U/L   Total Bilirubin 1.1 0.3 - 1.2 mg/dL   GFR calc non Af Amer >60 >60 mL/min   GFR calc Af Amer >60 >60 mL/min    Comment: (NOTE) The eGFR has been calculated using the CKD EPI equation. This calculation has not been validated in all clinical situations. eGFR's persistently <60 mL/min signify possible Chronic Kidney Disease.    Anion gap 6 5 - 15    Comment: Performed at [facility] [address], [place], [place] [address]   *Note: Due to a large number of results and/or encounters for the requested time period, some results have not been displayed. A complete set of results can be found in Results Review.     Constitutional: No distress . Vital signs reviewed. HEENT: EOMI, oral membranes moist Neck: supple Cardiovascular: RRR without murmur. No JVD    Respiratory: CTA Bilaterally without wheezes or rales. Normal effort    GI: BS +, non-tender, non-distended  Neuro: very alert  Motor: bilateral upper extremity 5/5 proximal distal Bilateral lower extremities 0/5 proximal to distal, sensory level at lower sternum---no motor or sensory changes. Tone 2-3/4 in LE's--no tone change Musc/Skel:  No edema or tenderness in extremities Psych:pleasant Skin: Warm and dry   Assessment/Plan: 1. Functional deficits secondary to T3 paraplegia which require  3+ hours per day of interdisciplinary therapy in a comprehensive inpatient rehab setting. Physiatrist is providing close team supervision and 24 hour management of active medical problems listed below. Physiatrist and rehab team continue to assess barriers to discharge/monitor patient progress toward functional and medical goals. FIM: Function - Bathing Position: Wheelchair/chair at sink Body parts bathed by patient: Right arm, Left arm, Chest, Abdomen(UB only) Body parts bathed by helper: Back Bathing not applicable: Front perineal area, Buttocks Assist Level: Set up, Supervision or verbal cues Assistive Device Comment: LH sponge and reacher Set up : To obtain items  Function- Upper Body Dressing/Undressing What is the patient wearing?: Pull over shirt/dress, Bra Bra - Perfomed by patient: Thread/unthread right bra strap, Thread/unthread left bra strap, Hook/unhook bra (pull down sports bra) Bra - Perfomed by helper: Hook/unhook bra (pull down sports bra) Pull over shirt/dress -  Perfomed by patient: Thread/unthread right sleeve, Thread/unthread left sleeve, Put head through opening, Pull shirt over trunk Pull over shirt/dress - Perfomed by helper: Pull shirt over trunk Assist Level: Set up, Supervision or verbal cues Set up : To obtain clothing/put away Function - Lower Body Dressing/Undressing What is the patient wearing?: Pants, Maryln Manuel, Shoes Position: Bed Pants- Performed by patient: Thread/unthread right pants leg, Thread/unthread left pants leg, Pull pants up/down Pants- Performed by helper: Pull pants up/down Shoes - Performed by helper: Don/doff right shoe, Don/doff left shoe, Fasten right, Fasten left TED Hose - Performed by helper: Don/doff right TED hose, Don/doff left TED hose Assist for footwear: Dependant Assist for lower body dressing: Touching or steadying assistance (Pt > 75%)  Function - Toileting Toileting activity did not occur: No continent bowel/bladder  event Toileting steps completed by patient: Adjust clothing prior to toileting, Performs perineal hygiene, Adjust clothing after toileting Toileting steps completed by helper: Adjust clothing after toileting, Performs perineal hygiene, Adjust clothing prior to toileting Assist level: Two helpers  Function - Air cabin crew transfer activity did not occur: Safety/medical concerns Toilet transfer assistive device: Bedside commode Assist level to toilet: 2 helpers(per Wyvonnia Lora, NT) Assist level from toilet: 2 helpers Assist level to bedside commode (at bedside): Maximal assist (Pt 25 - 49%/lift and lower) Assist level from bedside commode (at bedside): 2 helpers  Function - Chair/bed transfer Chair/bed transfer activity did not occur: N/A Chair/bed transfer method: Lateral scoot Chair/bed transfer assist level: Moderate assist (Pt 50 - 74%/lift or lower)(min<>mod) Chair/bed transfer assistive device: Sliding board Chair/bed transfer details: Manual facilitation for placement, Manual facilitation for weight shifting, Tactile cues for sequencing, Tactile cues for weight shifting, Verbal cues for safe use of DME/AE  Function - Locomotion: Wheelchair Will patient use wheelchair at discharge?: Yes Type: Manual Max wheelchair distance: 150 Assist Level: No help, No cues, assistive device, takes more than reasonable amount of time Wheel 50 feet with 2 turns activity did not occur: Safety/medical concerns Assist Level: No help, No cues, assistive device, takes more than reasonable amount of time Wheel 150 feet activity did not occur: Safety/medical concerns Assist Level: No help, No cues, assistive device, takes more than reasonable amount of time Turns around,maneuvers to table,bed, and toilet,negotiates 3% grade,maneuvers on rugs and over doorsills: Yes Function - Locomotion: Ambulation Ambulation activity did not occur: Safety/medical concerns Walk 10 feet activity did not occur:  Safety/medical concerns Walk 50 feet with 2 turns activity did not occur: Safety/medical concerns Walk 150 feet activity did not occur: Safety/medical concerns Walk 10 feet on uneven surfaces activity did not occur: Safety/medical concerns  Function - Comprehension Comprehension: Auditory Comprehension assist level: Understands complex 90% of the time/cues 10% of the time  Function - Expression Expression: Verbal Expression assist level: Expresses basic needs/ideas: With no assist  Function - Social Interaction Social Interaction assist level: Interacts appropriately with others with medication or extra time (anti-anxiety, antidepressant).  Function - Problem Solving Problem solving assist level: Solves basic problems with no assist  Function - Memory Memory assist level: Recognizes or recalls 90% of the time/requires cueing < 10% of the time Patient normally able to recall (first 3 days only): Current season, Location of own room, Staff names and faces, That he or she is in a hospital   Medical Problem List and Plan: 1.  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  Cont CIR,  dc bedrest?  -SNF pending  -s/p thoracic CT myelogram 8/5 2.  DVT Prophylaxis/Anticoagulation/history of pulmonary emboli: Xarelto.  Monitor for any bleeding episodes 3. Pain Management/spasticity:   -increase baclofen to 51m qid   -continue tramadol for more severe pain  -continue tylenol prn  -Stopped tizanidine 4. Mood: Zoloft 25 mg daily.  Provide emotional support 5. Neuropsych: This patient is capable of making decisions on her own behalf. 6. Skin/Wound Care:  Left knee incision well healed 7. Fluids/Electrolytes/Nutrition:  8.  Left femoral shaft fracture.  Status post intramedullary retrograde nailing 09/11/2017.  WBAT per ortho 9.  Acute blood loss anemia/pancytopenia.    Hemoglobin up to 8.0  8/5    10.  Hyperglycemia secondary to steroid.  improved  -dc'ed cbg checks and SSI 11.  Neurogenic bowel/ bladder.    - PM bowel program with some improvement  -Stopped Ditropan  -100k GNR   bactrim DS completed  -place foley at d/c  12.  Hypothyroidism.  Synthroid 13. Thrombocytopenia -Resolved 14.  Sinus symptoms improved-  = flonase, added ocean nasal spray  claritin   LOS (Days) 36 A FACE TO FACE EVALUATION WAS PERFORMED  ZMeredith Staggers8/08/2017, 8:51 AM

## 2017-10-28 ENCOUNTER — Inpatient Hospital Stay (HOSPITAL_COMMUNITY): Payer: Medicare Other | Admitting: Physical Therapy

## 2017-10-28 ENCOUNTER — Inpatient Hospital Stay (HOSPITAL_COMMUNITY): Payer: Medicare Other | Admitting: Occupational Therapy

## 2017-10-28 ENCOUNTER — Inpatient Hospital Stay (HOSPITAL_COMMUNITY): Payer: Medicare Other

## 2017-10-28 ENCOUNTER — Ambulatory Visit: Payer: Medicare Other | Admitting: Radiation Oncology

## 2017-10-28 NOTE — Progress Notes (Addendum)
Physical Therapy Session Note  Patient Details  Name: Sarah [name] MRN: [id number] Date of Birth: 1966/02/03  Today's Date: 10/28/2017 tx 1:  PT Individual Time: 1015-1100 PT Individual Time Calculation (min): 45 min   tx 2:  1455-1525 30 min individual Make-up Session  Short Term Goals: Week 4:  PT Short Term Goal 1 (Week 4): LTG=STG due to ELOS Week 5:  PT Short Term Goal 1 (Week 5): =LTGs due to ELOS      Skilled Therapeutic Interventions/Progress Updates:   tx 1:  Pt dozing in bed.  She was very pale, and stated she was very nauseated this AM and had recently gotten Zofran IV.  Pt agreeable to do bedside tx.  PROM bil heel cords, hamstrings.  Pt's spasticity of bil hip adductors and hamstrings interfere with PROM.  Using bed features, ptt donned R/L leg loops with min assist for keeping R/L ankle above patella when crossing legs.  Pt declined getting OOB due to nausea.   Pt left resting in bed with alarm set and needs at hand.  tx 2:  Pt sitting up in w/c.  Pillow placed behind back to address pt's back pain.  Pt propelled w/c on unit independenty.   tx 2: Make up Session  UBE at level 8.5 x 5 minutes, backwards, seated in w/c  Transfer training: Problem solving strategies for managing bil LEs on/off footrests of w/c.  It pt initites position of R foot with L hand, then uses R hand to complete placing foot on footrest, she is able to move R foot laterally onto foot plate more effectively.    Pt left resting in w/c with all needs within reach.     Therapy Documentation Precautions:  Precautions Precautions: Fall Precaution Comments: throracic region, LEs spasicity  Restrictions Weight Bearing Restrictions: Yes LLE Weight Bearing: Touchdown weight bearing Other Position/Activity Restrictions: B LEs(Order updated 10/14/17)  Pain: 2/10 low back; premedicated, AM and PM session      See Function Navigator for Current Functional  Status.   Therapy/Group: Individual Therapy  Taiten Brawn 10/28/2017, 12:15 PM

## 2017-10-28 NOTE — Progress Notes (Addendum)
Subjective/Complaints: Pt woke up with nausea, dry heaves this morning. Not sure why. Seems to be doing a little better when I came in  ROS: Patient denies fever, rash, sore throat, blurred vision,   diarrhea, cough, shortness of breath or chest pain, joint or back pain, headache, or mood change.   Objective: Vital Signs: Blood pressure (!) 146/84, pulse 67, temperature 97.9 F (36.6 C), temperature source Oral, resp. rate 16, height 5' 11"  (1.803 m), weight 103.2 kg (227 lb 9.8 oz), last menstrual period 10/22/2016, SpO2 100 %. Ct Thoracic Spine W Contrast  Result Date: 10/26/2017 CLINICAL DATA:  Metastatic Hurthle cell thyroid cancer. FLUOROSCOPY TIME:  Radiation Exposure Index (as provided by the fluoroscopic device): 692.07 microGray*m^2 Fluoroscopy Time (in minutes and seconds):  1 minute 24 seconds PROCEDURE: LUMBAR PUNCTURE FOR THORACIC MYELOGRAM After thorough discussion of risks and benefits of the procedure including bleeding, infection, injury to nerves, blood vessels, adjacent structures as well as headache and CSF leak, written and oral informed consent was obtained. Consent was obtained by Dr. Logan Bores. Patient was positioned prone on the fluoroscopy table. Local anesthesia was provided with 1% lidocaine without epinephrine after prepped and draped in the usual sterile fashion. Puncture was performed at L2-3 using a 5 inch 22-gauge spinal needle via a right paramedian approach. Using a single pass through the dura, the needle was placed within the thecal sac, with return of clear CSF. 10 mL of Isovue M-300 was injected into the thecal sac, with normal opacification of the nerve roots and cauda equina consistent with free flow within the subarachnoid space. The patient was then moved to the trendelenburg position and contrast flowed into the Thoracic spine region. I personally performed the lumbar puncture and administered the intrathecal contrast. I also personally supervised  acquisition of the myelogram images. TECHNIQUE: Contiguous axial images were obtained through the Thoracic spine after the intrathecal infusion of infusion. Coronal and sagittal reconstructions were obtained of the axial image sets. COMPARISON:  Noncontrast thoracic spine CT 10/23/2017. Thoracic and lumbar spine MRI 09/02/2017. FINDINGS: THORACIC MYELOGRAM FINDINGS: The spinal canal is widely patent in the lower thoracic spine. A ventral extradural defect at T7-8 results in moderate spinal stenosis. Sequelae of T1-T5 posterior fusion are again identified. Contrast was not seen to extend superior to the T4 level despite significant Trendelenburg positioning with the patient prone and in a left lateral decubitus position. CT THORACIC MYELOGRAM FINDINGS: Mildly exaggerated kyphosis is again noted in the upper thoracic spine. There is mild right convex curvature of the thoracic spine without significant listhesis. Sequelae of T1-T5 posterior fusion are again identified with pedicle screws remaining in place at T1, T2, T4, and T5. A chronic pathologic T3 fracture is noted with unchanged vertebra plana configuration. Lytic lesions in the T2 and T4 vertebral bodies are unchanged from the recent prior CT with involvement of bone about both T4 screws. No new fracture is identified. Bridging anterior and lateral vertebral osteophytes are present from T2-L2. Intrathecal contrast is present above the level of the thoracic fusion despite not being visible on conventional myelographic images, although contrast in the lower cervical spine is very dilute. There is extensive soft tissue throughout the spinal canal from T2-T4 at the site of prior posterior decompression which results in complete effacement of the thecal sac at the T3 and upper T4 levels and presumably reflects recurrent epidural tumor. The thecal sac is narrowed in the transverse dimension at the T2 level. Nodular epidural soft tissue extends superiorly  on the left  as far as the T1 superior endplate level with mild spinal stenosis at T1. There is also nodular epidural soft tissue extending inferiorly in the ventral aspect of the canal to the mid T5 level. Nodular dorsal epidural soft tissue is also present at T5 contributing to mild spinal stenosis. Dorsal epidural lipomatosis is noted in the midthoracic spine with mildly nodular appearance of the fat at T6. There is nodular soft tissue dorsally in the canal at L1 and L2, the largest focus measuring 7 mm in the midline at L1, which results in mild spinal stenosis and is new from the prior MRI. It is not clear whether this material is extradural, intradural, or possibly a combination of both. The visible proximal cauda equina nerve roots are not grossly thickened. A partially calcified central disc extrusion at T7-8 is unchanged from the prior MRI and results in moderate spinal stenosis with ventral cord flattening. A smaller calcified left paracentral disc protrusion at T8-9 is also unchanged and results in mild left-sided cord flattening without significant spinal stenosis. There is severe right facet arthrosis at T12-L1 roots results in mild right neural foraminal stenosis, unchanged. Coronary artery atherosclerosis, trace right and small left pleural effusions, and small volume assault fungemia fluid are noted. IMPRESSION: 1. 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 2. Epidural tumor extends superiorly to T1 and inferiorly to T5 with milder spinal stenosis. 3. New nodular soft tissue dorsally in the canal at L1 and L2 which results in mild spinal stenosis and presumably reflects tumor as well, though it is unclear if this is completely extradural or partially intradural as well. 4. Unchanged appearance of T1-T5 posterior fusion, severe T3 pathologic fracture, and T2-T4 osseous tumor compared to the recent noncontrast CT.  Electronically Signed   By: Logan Bores M.D.   On: 10/26/2017 16:46   Dg Myelography Lumbar Inj Thoracic  Result Date: 10/26/2017 CLINICAL DATA:  Metastatic Hurthle cell thyroid cancer. FLUOROSCOPY TIME:  Radiation Exposure Index (as provided by the fluoroscopic device): 692.07 microGray*m^2 Fluoroscopy Time (in minutes and seconds):  1 minute 24 seconds PROCEDURE: LUMBAR PUNCTURE FOR THORACIC MYELOGRAM After thorough discussion of risks and benefits of the procedure including bleeding, infection, injury to nerves, blood vessels, adjacent structures as well as headache and CSF leak, written and oral informed consent was obtained. Consent was obtained by Dr. Logan Bores. Patient was positioned prone on the fluoroscopy table. Local anesthesia was provided with 1% lidocaine without epinephrine after prepped and draped in the usual sterile fashion. Puncture was performed at L2-3 using a 5 inch 22-gauge spinal needle via a right paramedian approach. Using a single pass through the dura, the needle was placed within the thecal sac, with return of clear CSF. 10 mL of Isovue M-300 was injected into the thecal sac, with normal opacification of the nerve roots and cauda equina consistent with free flow within the subarachnoid space. The patient was then moved to the trendelenburg position and contrast flowed into the Thoracic spine region. I personally performed the lumbar puncture and administered the intrathecal contrast. I also personally supervised acquisition of the myelogram images. TECHNIQUE: Contiguous axial images were obtained through the Thoracic spine after the intrathecal infusion of infusion. Coronal and sagittal reconstructions were obtained of the axial image sets. COMPARISON:  Noncontrast thoracic spine CT 10/23/2017. Thoracic and lumbar spine MRI 09/02/2017. FINDINGS: THORACIC MYELOGRAM FINDINGS: The spinal canal is widely patent  in the lower thoracic spine. A ventral extradural defect at T7-8 results in  moderate spinal stenosis. Sequelae of T1-T5 posterior fusion are again identified. Contrast was not seen to extend superior to the T4 level despite significant Trendelenburg positioning with the patient prone and in a left lateral decubitus position. CT THORACIC MYELOGRAM FINDINGS: Mildly exaggerated kyphosis is again noted in the upper thoracic spine. There is mild right convex curvature of the thoracic spine without significant listhesis. Sequelae of T1-T5 posterior fusion are again identified with pedicle screws remaining in place at T1, T2, T4, and T5. A chronic pathologic T3 fracture is noted with unchanged vertebra plana configuration. Lytic lesions in the T2 and T4 vertebral bodies are unchanged from the recent prior CT with involvement of bone about both T4 screws. No new fracture is identified. Bridging anterior and lateral vertebral osteophytes are present from T2-L2. Intrathecal contrast is present above the level of the thoracic fusion despite not being visible on conventional myelographic images, although contrast in the lower cervical spine is very dilute. There is extensive soft tissue throughout the spinal canal from T2-T4 at the site of prior posterior decompression which results in complete effacement of the thecal sac at the T3 and upper T4 levels and presumably reflects recurrent epidural tumor. The thecal sac is narrowed in the transverse dimension at the T2 level. Nodular epidural soft tissue extends superiorly on the left as far as the T1 superior endplate level with mild spinal stenosis at T1. There is also nodular epidural soft tissue extending inferiorly in the ventral aspect of the canal to the mid T5 level. Nodular dorsal epidural soft tissue is also present at T5 contributing to mild spinal stenosis. Dorsal epidural lipomatosis is noted in the midthoracic spine with mildly nodular appearance of the fat at T6. There is nodular soft tissue dorsally in the canal at L1 and L2, the largest  focus measuring 7 mm in the midline at L1, which results in mild spinal stenosis and is new from the prior MRI. It is not clear whether this material is extradural, intradural, or possibly a combination of both. The visible proximal cauda equina nerve roots are not grossly thickened. A partially calcified central disc extrusion at T7-8 is unchanged from the prior MRI and results in moderate spinal stenosis with ventral cord flattening. A smaller calcified left paracentral disc protrusion at T8-9 is also unchanged and results in mild left-sided cord flattening without significant spinal stenosis. There is severe right facet arthrosis at T12-L1 roots results in mild right neural foraminal stenosis, unchanged. Coronary artery atherosclerosis, trace right and small left pleural effusions, and small volume assault fungemia fluid are noted. IMPRESSION: 1. 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 2. Epidural tumor extends superiorly to T1 and inferiorly to T5 with milder spinal stenosis. 3. New nodular soft tissue dorsally in the canal at L1 and L2 which results in mild spinal stenosis and presumably reflects tumor as well, though it is unclear if this is completely extradural or partially intradural as well. 4. Unchanged appearance of T1-T5 posterior fusion, severe T3 pathologic fracture, and T2-T4 osseous tumor compared to the recent noncontrast CT. Electronically Signed   By: Logan Bores M.D.   On: 10/26/2017 16:46   Results for orders placed or performed during the hospital encounter of 09/21/17 (from the past 72 hour(s))  CBC     Status: Abnormal   Collection Time: 10/26/17  5:55 AM  Result Value Ref Range   WBC 3.2 (L) 4.0 - 10.5 K/uL   RBC 2.51 (L) 3.87 - 5.11 MIL/uL   Hemoglobin 8.0 (L) 12.0 - 15.0 g/dL   HCT 26.5 (L) 36.0 - 46.0 %   MCV 105.6 (H) 78.0 - 100.0 fL   MCH 31.9 26.0 - 34.0 pg   MCHC 30.2 30.0 -  36.0 g/dL   RDW 18.2 (H) 11.5 - 15.5 %   Platelets 156 150 - 400 K/uL    Comment: Performed at [facility] [address] [place], [place] [address]  Comprehensive metabolic panel     Status: Abnormal   Collection Time: 10/26/17  5:55 AM  Result Value Ref Range   Sodium 143 135 - 145 mmol/L   Potassium 3.8 3.5 - 5.1 mmol/L   Chloride 111 98 - 111 mmol/L   CO2 26 22 - 32 mmol/L   Glucose, Bld 90 70 - 99 mg/dL   BUN 9 6 - 20 mg/dL   Creatinine, Ser 0.88 0.44 - 1.00 mg/dL   Calcium 7.6 (L) 8.9 - 10.3 mg/dL   Total Protein 4.6 (L) 6.5 - 8.1 g/dL   Albumin 1.9 (L) 3.5 - 5.0 g/dL   AST 18 15 - 41 U/L   ALT 8 0 - 44 U/L   Alkaline Phosphatase 233 (H) 38 - 126 U/L   Total Bilirubin 1.1 0.3 - 1.2 mg/dL   GFR calc non Af Amer >60 >60 mL/min   GFR calc Af Amer >60 >60 mL/min    Comment: (NOTE) The eGFR has been calculated using the CKD EPI equation. This calculation has not been validated in all clinical situations. eGFR's persistently <60 mL/min signify possible Chronic Kidney Disease.    Anion gap 6 5 - 15    Comment: Performed at [facility] [address], [place], [place] [address]   *Note: Due to a large number of results and/or encounters for the requested time period, some results have not been displayed. A complete set of results can be found in Results Review.     Constitutional: No distress . Vital signs reviewed. HEENT: EOMI, oral membranes moist Neck: supple Cardiovascular: RRR without murmur. No JVD    Respiratory: CTA Bilaterally without wheezes or rales. Normal effort    GI: BS +, non-tender, non-distended     Neuro: very alert  Motor: bilateral upper extremity 5/5 proximal distal Bilateral lower extremities 0/5 proximal to distal, sensory level at lower sternum---no motor or sensory changes. Tone 1-3/4 in LE's--tone decreased but inconsistent Musc/Skel:  No edema or tenderness in extremities Psych:pleasant Skin: Warm and  dry   Assessment/Plan: 1. Functional deficits secondary to T3 paraplegia which require 3+ hours per day of interdisciplinary therapy in a comprehensive inpatient rehab setting. Physiatrist is providing close team supervision and 24 hour management of active medical problems listed below. Physiatrist and rehab team continue to assess barriers to discharge/monitor patient progress toward functional and medical goals. FIM: Function - Bathing Position: Bed Body parts bathed by patient: Right arm, Left arm, Chest, Abdomen, Right upper leg, Left upper leg, Right lower leg, Left lower leg Body parts bathed by helper: Back Bathing not applicable: Front perineal area, Buttocks Assist Level: Set up, Supervision or verbal cues Assistive Device Comment: LH sponge and reacher Set up : To obtain items  Function- Upper Body Dressing/Undressing What is the patient wearing?: Pull over shirt/dress, Bra Bra - Perfomed by patient: Thread/unthread right bra strap,  Thread/unthread left bra strap Bra - Perfomed by helper: Hook/unhook bra (pull down sports bra) Pull over shirt/dress - Perfomed by patient: Thread/unthread right sleeve, Thread/unthread left sleeve, Put head through opening, Pull shirt over trunk Pull over shirt/dress - Perfomed by helper: Pull shirt over trunk Assist Level: Touching or steadying assistance(Pt > 75%) Set up : To obtain clothing/put away Function - Lower Body Dressing/Undressing What is the patient wearing?: Ted Hose Position: Bed Pants- Performed by patient: Thread/unthread right pants leg, Thread/unthread left pants leg, Pull pants up/down Pants- Performed by helper: Pull pants up/down Shoes - Performed by helper: Don/doff right shoe, Don/doff left shoe, Fasten right, Fasten left TED Hose - Performed by helper: Don/doff right TED hose, Don/doff left TED hose Assist for footwear: Dependant Assist for lower body dressing: Set up, Supervision or verbal cues Set up : Don/doff  TED stockings  Function - Toileting Toileting activity did not occur: No continent bowel/bladder event Toileting steps completed by patient: Adjust clothing prior to toileting, Performs perineal hygiene, Adjust clothing after toileting Toileting steps completed by helper: Adjust clothing after toileting, Performs perineal hygiene, Adjust clothing prior to toileting Assist level: Two helpers  Function - Air cabin crew transfer activity did not occur: Safety/medical concerns Toilet transfer assistive device: Bedside commode Assist level to toilet: 2 helpers(per Wyvonnia Lora, NT) Assist level from toilet: 2 helpers Assist level to bedside commode (at bedside): Maximal assist (Pt 25 - 49%/lift and lower) Assist level from bedside commode (at bedside): 2 helpers  Function - Chair/bed transfer Chair/bed transfer activity did not occur: N/A Chair/bed transfer method: Lateral scoot Chair/bed transfer assist level: Moderate assist (Pt 50 - 74%/lift or lower) Chair/bed transfer assistive device: Sliding board Chair/bed transfer details: Manual facilitation for placement, Manual facilitation for weight shifting, Tactile cues for sequencing, Verbal cues for safe use of DME/AE  Function - Locomotion: Wheelchair Will patient use wheelchair at discharge?: Yes Type: Manual Max wheelchair distance: 400' Assist Level: No help, No cues, assistive device, takes more than reasonable amount of time Wheel 50 feet with 2 turns activity did not occur: Safety/medical concerns Assist Level: No help, No cues, assistive device, takes more than reasonable amount of time Wheel 150 feet activity did not occur: Safety/medical concerns Assist Level: No help, No cues, assistive device, takes more than reasonable amount of time Turns around,maneuvers to table,bed, and toilet,negotiates 3% grade,maneuvers on rugs and over doorsills: Yes Function - Locomotion: Ambulation Ambulation activity did not occur:  Safety/medical concerns Walk 10 feet activity did not occur: Safety/medical concerns Walk 50 feet with 2 turns activity did not occur: Safety/medical concerns Walk 150 feet activity did not occur: Safety/medical concerns Walk 10 feet on uneven surfaces activity did not occur: Safety/medical concerns  Function - Comprehension Comprehension: Auditory Comprehension assist level: Understands complex 90% of the time/cues 10% of the time  Function - Expression Expression: Verbal Expression assist level: Expresses basic needs/ideas: With no assist  Function - Social Interaction Social Interaction assist level: Interacts appropriately with others with medication or extra time (anti-anxiety, antidepressant).  Function - Problem Solving Problem solving assist level: Solves basic problems with no assist  Function - Memory Memory assist level: Recognizes or recalls 90% of the time/requires cueing < 10% of the time Patient normally able to recall (first 3 days only): Current season, Location of own room, Staff names and faces, That he or she is in a hospital   Medical Problem List and Plan: 1.  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  Cont CIR, dc bedrest?  -SNF pending  -s/p thoracic CT myelogram 8/5--demonstrates severe T3 stenosis and residual tumor. Some tumor potentially in lumbar spine as well. 2.  DVT Prophylaxis/Anticoagulation/history of pulmonary emboli: Xarelto.  Monitor for any bleeding episodes 3. Pain Management/spasticity:   -increased baclofen to 64m qid which by exam and therapy/pt reports seems to be helping tone   -continue tramadol for more severe pain  -continue tylenol prn  -Stopped tizanidine 4. Mood: Zoloft 25 mg daily.  Provide emotional support 5. Neuropsych: This patient is capable of making decisions on her own behalf. 6. Skin/Wound Care:  Left knee  incision well healed 7. Fluids/Electrolytes/Nutrition:  8.  Left femoral shaft fracture.  Status post intramedullary retrograde nailing 09/11/2017.  WBAT per ortho 9.  Acute blood loss anemia/pancytopenia.    Hemoglobin up to 8.0 8/5    10.  Hyperglycemia secondary to steroid.  improved  -dc'ed cbg checks and SSI 11.  Neurogenic bowel/ bladder.    - PM bowel program with some improvement  -Stopped Ditropan  -100k GNR   bactrim DS completed  -place foley at d/c   -monitor for further nausea 12.  Hypothyroidism.  Synthroid 13. Thrombocytopenia -Resolved 14.  Sinus symptoms improved-  = flonase, added ocean nasal spray  claritin   LOS (Days) 37 A FACE TO FACE EVALUATION WAS PERFORMED  ZMeredith Staggers8/09/2017, 9:09 AM

## 2017-10-28 NOTE — Progress Notes (Signed)
Occupational Therapy Session Note  Patient Details  Name: Sarah [name] MRN: [id number] Date of Birth: Oct 01, 1965  Today's Date: 10/28/2017 OT Individual Time: 0700-0740 OT Individual Time Calculation (min): 40 min  and Today's Date: 10/28/2017 OT Missed Time: 20 Minutes Missed Time Reason: Patient ill (comment)(nausea and vomiting)   Short Term Goals: Week 5:  OT Short Term Goal 1 (Week 5): STGs=LTGs due to ELOS  Skilled Therapeutic Interventions/Progress Updates:    Upon entering the room, pt supine in bed and agreeable to OT intervention. Pt with no c/o pain this session. Pt performing bathing from bed level with set up A and use of reacher and LH sponge to increase I with task. Pt donning pull over shirt with set up A. Pt attempting to thread pants onto feet with reacher but began to vomit. RN notified. Pt vomiting several times and continuing to do so as therapist exited the room. Bed alarm activated for safety. Call bell within reach. 20 missed minutes secondary to pt being ill.   Therapy Documentation Precautions:  Precautions Precautions: Fall Precaution Comments: throracic region, LEs spasicity  Restrictions Weight Bearing Restrictions: Yes LLE Weight Bearing: Touchdown weight bearing Other Position/Activity Restrictions: B LEs(Order updated 10/14/17) General: General OT Amount of Missed Time: 20 Minutes ADL: ADL ADL Comments: see functional navigator  See Function Navigator for Current Functional Status.   Therapy/Group: Individual Therapy  [name] 10/28/2017, 12:34 PM

## 2017-10-28 NOTE — Progress Notes (Signed)
Physical Therapy Session Note  Patient Details  Name: Sarah [name] MRN: [id number] Date of Birth: 08-09-1965  Today's Date: 10/28/2017 PT Individual Time: 1015-1100 PT Individual Time Calculation (min): 45 min   Short Term Goals: Week 5:  PT Short Term Goal 1 (Week 5): =LTGs due to ELOS  Skilled Therapeutic Interventions/Progress Updates:    Pt performs supine> sitting EOB with min A using leg loops and head of bed elevated. Slide board transfer from elevated surface for efficiency with 3 inch block for LE's min A fading to mod A for boosting to chair. Mod A to facilitate weight shifting in chair for proper seated posturing. Pt brushes teeth and combs hair at sink mod I. Pt propels w/c from room<>treatment gym 118 feet mod I. UE strengthening exercises includes bicep curls with 9# wt and shoulder flexion with 4# wt for 3x15 reps. Pt propels w/c back to room and remains sitting with tray table and call bell in reach and all needs met.  Therapy Documentation Precautions:  Precautions Precautions: Fall Precaution Comments: throracic region, LEs spasicity  Restrictions Weight Bearing Restrictions: Yes LLE Weight Bearing: Touchdown weight bearing Other Position/Activity Restrictions: B LEs(Order updated 10/14/17)  See Function Navigator for Current Functional Status.   Therapy/Group: Individual Therapy  Floreen Comber 10/28/2017, 1:50 PM

## 2017-10-28 NOTE — Progress Notes (Signed)
Physical Therapy Session Note  Patient Details  Name: Sarah [name] MRN: [id number] Date of Birth: 29-Nov-1965  Today's Date: 10/28/2017 PT Individual Time: 1545-1630 PT Individual Time Calculation (min): 45 min   Short Term Goals: Week 5:  PT Short Term Goal 1 (Week 5): =LTGs due to ELOS  Skilled Therapeutic Interventions/Progress Updates:    no c/o pain.  Session focus on activity tolerance with bed mobility and transfers.    Pt propels w/c throughout unit mod I.  Attempted standing frame, pt able to come to full standing with lift, however noted to be soaked with urine on standing, so returned to room.  Min<>mod assist for slide board transfer back to bed, and mod assist for sit>supine for LEs.  Pt able to roll R and L in bed with min assist for positioning LEs into flexed position to manage tone. Pt able to maintain side lying on R and L with bed rails and supervision while PT provided total assist for clothing management.  Positioned upright in bed at end of session with call bell in reach and needs met.   Therapy Documentation Precautions:  Precautions Precautions: Fall Precaution Comments: throracic region, LEs spasicity  Restrictions Weight Bearing Restrictions: Yes LLE Weight Bearing: Touchdown weight bearing Other Position/Activity Restrictions: B LEs(Order updated 10/14/17)   See Function Navigator for Current Functional Status.   Therapy/Group: Individual Therapy  Michel Santee 10/28/2017, 4:52 PM

## 2017-10-29 ENCOUNTER — Other Ambulatory Visit: Payer: Medicare Other

## 2017-10-29 ENCOUNTER — Inpatient Hospital Stay (HOSPITAL_COMMUNITY): Payer: Medicare Other | Admitting: Physical Therapy

## 2017-10-29 ENCOUNTER — Inpatient Hospital Stay: Payer: Medicare Other

## 2017-10-29 ENCOUNTER — Inpatient Hospital Stay: Payer: Medicare Other | Admitting: Hematology

## 2017-10-29 ENCOUNTER — Inpatient Hospital Stay (HOSPITAL_COMMUNITY): Payer: Medicare Other | Admitting: Occupational Therapy

## 2017-10-29 ENCOUNTER — Inpatient Hospital Stay (HOSPITAL_COMMUNITY): Payer: Medicare Other

## 2017-10-29 MED ORDER — LEVOTHYROXINE SODIUM 137 MCG PO TABS
137.0000 ug | ORAL_TABLET | Freq: Every day | ORAL | Status: DC
  Administered 2017-10-29 – 2017-10-30 (×2): 137 ug via ORAL
  Filled 2017-10-29 (×2): qty 1

## 2017-10-29 MED ORDER — FLEET ENEMA 7-19 GM/118ML RE ENEM: 1.0000 | ENEMA | Freq: Every day | RECTAL | 0 refills | Status: DC | PRN

## 2017-10-29 MED ORDER — POLYETHYLENE GLYCOL 3350 17 G PO PACK: 17.0000 g | PACK | Freq: Every day | ORAL | 0 refills | Status: DC

## 2017-10-29 MED ORDER — BACLOFEN 20 MG PO TABS: 20.0000 mg | ORAL_TABLET | Freq: Three times a day (TID) | ORAL | 0 refills | Status: DC

## 2017-10-29 MED ORDER — SUCRALFATE 1 GM/10ML PO SUSP: 1.0000 g | Freq: Three times a day (TID) | ORAL | 0 refills | Status: DC

## 2017-10-29 MED ORDER — SENNA 8.6 MG PO TABS: 1.0000 | ORAL_TABLET | Freq: Two times a day (BID) | ORAL | 0 refills | Status: DC

## 2017-10-29 MED ORDER — AMOXICILLIN 250 MG PO CAPS
500.0000 mg | ORAL_CAPSULE | Freq: Three times a day (TID) | ORAL | Status: DC
  Administered 2017-10-29 – 2017-10-30 (×3): 500 mg via ORAL
  Filled 2017-10-29 (×3): qty 2

## 2017-10-29 MED ORDER — LORATADINE 10 MG PO TABS: 10.0000 mg | ORAL_TABLET | Freq: Every day | ORAL | Status: DC

## 2017-10-29 MED ORDER — SERTRALINE HCL 25 MG PO TABS: 25.0000 mg | ORAL_TABLET | Freq: Every day | ORAL | 0 refills | Status: DC

## 2017-10-29 NOTE — Progress Notes (Signed)
Physical Therapy Discharge Summary  Patient Details  Name: Sarah [name] MRN: [id number] Date of Birth: Aug 19, 1965  Today's Date: 10/29/2017 PT Individual Time: 1300-1345 PT Individual Time Calculation (min): 45 min    Patient has met 6 of 6 long term goals due to improved activity tolerance, improved balance, improved postural control, increased strength and ability to compensate for deficits.  Patient to discharge at a wheelchair level [place].   Patient's care partner unavailable to provide the necessary physical assistance at discharge. Pt to d/c to SNF for further rehab prior to returning home with family.   Reasons goals not met: n/a  Recommendation:  Patient will benefit from ongoing skilled PT services in skilled nursing facility setting to continue to advance safe functional mobility, address ongoing impairments in postural control, balance, strength, and activity tolerance, and minimize fall risk.  Equipment: No equipment provided, to be addressed at next level of care.   Reasons for discharge: treatment goals met  Patient/family agrees with progress made and goals achieved: Yes   Skilled PT Intervention: No c/o pain but does report she had some nausea/dizziness following OT this AM.  Session focus on bed mobility, transfers, and activity tolerance.  Pt transitions to EOB with min assist for LE with leg loops, pt able to self direct care for therapist to use bed features to assist with side lying to sit.  Slide board transfer to w/c on pt's R with overall mod assist to initiate scooting and min assist thereafter.  Pt propelled w/c to [employer]	[place], and reports dizziness/ringing in ear.  Returned to room and RN alerted.  Call bell in reach and needs met.   PT Discharge Precautions/Restrictions Precautions Precautions: Fall Precaution Booklet Issued: No Precaution Comments: LEs spasicity  Restrictions Weight Bearing Restrictions: No LLE Weight Bearing: Weight bearing as  tolerated Pain Pain Assessment Pain Scale: 0-10 Pain Score: 0-No pain Vision/Perception  Perception Perception: Within Functional Limits Praxis Praxis: Intact  Cognition Overall Cognitive Status: Within Functional Limits for tasks assessed Arousal/Alertness: Awake/alert Orientation Level: Oriented X4 Attention: Alternating Alternating Attention: Appears intact Memory: Appears intact Awareness: Appears intact Problem Solving: Appears intact Sensation Sensation Light Touch: Impaired Detail Central sensation comments: sensation absent below waist, significant tone in BLEs especially adductor but also flexor when in supine Light Touch Impaired Details: Absent LLE;Absent RLE Proprioception: Impaired Detail Proprioception Impaired Details: Absent RLE;Absent LLE Coordination Gross Motor Movements are Fluid and Coordinated: No Fine Motor Movements are Fluid and Coordinated: Yes Motor  Motor Motor: Paraplegia;Abnormal tone Motor - Discharge Observations: spasticity in BLEs  Mobility Bed Mobility Bed Mobility: Rolling Right;Rolling Left;Left Sidelying to Sit Rolling Right: Contact Guard/Touching assist Rolling Left: Contact Guard/Touching assist Left Sidelying to Sit: Minimal Assistance - Patient >75% Supine to Sit: Minimal Assistance - Patient > 75% Sitting - Scoot to Edge of Bed: Minimal Assistance - Patient > 75% Sit to Supine: Moderate Assistance - Patient 50-74% Transfers Transfers: Lateral/Scoot Transfers Lateral/Scoot Transfers: Moderate Assistance - Patient 50-74% Transfer (Assistive device): (slide board) Locomotion  Gait Ambulation: No Gait Gait: No Stairs / Additional Locomotion Stairs: No Wheelchair Mobility Wheelchair Mobility: Yes Wheelchair Assistance: Independent with Camera operator: Both upper extremities Wheelchair Parts Management: Needs assistance Distance: >500'  Trunk/Postural Assessment  Cervical Assessment Cervical  Assessment: Exceptions to WFL(forward head) Thoracic Assessment Thoracic Assessment: Exceptions to WFL(forward rounded shoulders, kyphosis, mild lateral curvature) Lumbar Assessment Lumbar Assessment: Exceptions to WFL(posterior pelvic tilt) Postural Control Postural Control: Deficits on evaluation Trunk Control: impaired, requires at  least single UE support or UE support on lap to maintain trunk control Righting Reactions: present with UEs Protective Responses: Present with UEs  Balance Balance Balance Assessed: Yes Static Sitting Balance Static Sitting - Balance Support: Feet supported;Left upper extremity supported;Right upper extremity supported Static Sitting - Level of Assistance: 6: Modified independent (Device/Increase time) Dynamic Sitting Balance Dynamic Sitting - Balance Support: Feet supported;Left upper extremity supported;Right upper extremity supported Dynamic Sitting - Level of Assistance: 5: Stand by assistance Dynamic Sitting - Balance Activities: Lateral lean/weight shifting;Forward lean/weight shifting;Reaching across midline Extremity Assessment  RUE Assessment RUE Assessment: Within Functional Limits LUE Assessment LUE Assessment: Within Functional Limits RLE Assessment RLE Assessment: Exceptions to [facility] Passive Range of Motion (PROM) Comments: WFL but can be limited by tone Active Range of Motion (AROM) Comments: RLE 0/5  General Strength Comments: no active movement noted in RLE LLE Assessment LLE Assessment: Exceptions to [facility] Passive Range of Motion (PROM) Comments: WFL but can be limited by tone Active Range of Motion (AROM) Comments: LLE 0/5 General Strength Comments: no active movement noted in LLE   See Function Navigator for Current Functional Status.  Michel Santee 10/29/2017, 4:39 PM

## 2017-10-29 NOTE — Progress Notes (Signed)
Occupational Therapy Discharge Summary  Patient Details  Name: Sarah [name] MRN: [id number] Date of Birth: 1965/03/30    Patient has met 6 of 7 long term goals due to improved activity tolerance, improved balance and ability to compensate for deficits.  Patient to discharge at overall set up A for bathing from bed level, set up A UB, Min A LB, min - mod A for functional transfers with slideboard level.    Reasons goals not met: toileting goal not met as pt needing total A - +2 helpers for safety with task  Recommendation:  Patient will benefit from ongoing skilled OT services in skilled nursing facility setting to continue to advance functional skills in the area of BADL and Reduce care partner burden.  Equipment: TTB - padded for bowel program  Reasons for discharge: treatment goals met  Patient/family agrees with progress made and goals achieved: Yes  OT Discharge Precautions/Restrictions  Precautions Precautions: Fall Precaution Booklet Issued: No Precaution Comments: LEs spasicity  Restrictions Weight Bearing Restrictions: No LLE Weight Bearing: Weight bearing as tolerated General   Vital Signs Therapy Vitals Temp: (!) 97.4 F (36.3 C) Temp Source: Oral Pulse Rate: 80 BP: 131/87 Patient Position (if appropriate): Sitting Oxygen Therapy SpO2: 100 % O2 Device: Room Air Pain Pain Assessment Pain Scale: 0-10 Pain Score: 0-No pain ADL ADL ADL Comments: see functional navigator Vision Baseline Vision/History: Wears glasses Wears Glasses: Reading only Patient Visual Report: No change from baseline Cognition Overall Cognitive Status: Within Functional Limits for tasks assessed Arousal/Alertness: Awake/alert Orientation Level: Oriented X4 Sensation Sensation Light Touch: Impaired Detail Light Touch Impaired Details: Absent LLE;Absent RLE Proprioception: Impaired Detail Proprioception Impaired Details: Absent RLE;Absent LLE Coordination Gross Motor  Movements are Fluid and Coordinated: No Fine Motor Movements are Fluid and Coordinated: Yes Motor  Motor Motor: Paraplegia;Abnormal tone Mobility  Bed Mobility Bed Mobility: Rolling Right;Rolling Left;Right Sidelying to Sit Rolling Right: Contact Guard/Touching assist Rolling Left: Contact Guard/Touching assist Supine to Sit: Minimal Assistance - Patient > 75%  Trunk/Postural Assessment  Cervical Assessment Cervical Assessment: Within Functional Limits Thoracic Assessment Thoracic Assessment: Exceptions to [facility] Lumbar Assessment Lumbar Assessment: Exceptions to [facility] Postural Control Postural Control: Deficits on evaluation  Balance Balance Balance Assessed: Yes Dynamic Sitting Balance Dynamic Sitting - Balance Support: Feet supported;No upper extremity supported Dynamic Sitting - Level of Assistance: 4: Min assist Extremity/Trunk Assessment RUE Assessment RUE Assessment: Within Functional Limits LUE Assessment LUE Assessment: Within Functional Limits   See Function Navigator for Current Functional Status.  Darleen Crocker P 10/29/2017, 3:15 PM

## 2017-10-29 NOTE — Discharge Summary (Signed)
NAME: Sarah [name], Sarah [name] MEDICAL RECORD ZO:[id number] ACCOUNT [id number] DATE OF BIRTH:04-Oct-1965 FACILITY: [facility] LOCATION: [facility] PHYSICIAN:ZACHARY SWARTZ, MD  DISCHARGE SUMMARY  DATE OF DISCHARGE:  10/30/2017  DISCHARGE DIAGNOSES:  1.  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 2.  History of pulmonary emboli, maintained on Xarelto.   3.  Pain management.   4.  Depression.   5.  Left femoral shaft fracture.   6.  Acute blood loss anemia.   7.  Neurogenic bowel and bladder.   8.  Hypothyroidism.   Thrombocytopenia, resolved. 9.  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  Lives with her sister and  brother-in-law.  She had been using a sliding board for transfers.  Steady decline noted.  Essentially wheelchair bound.  Presented 09/02/2017 with recent fall and increasing leg weakness over 2 weeks.  She was to see her oncologist, 08/24/2017 for  followup MRI that have been planned, but could not make appointment due to lower extremity weakness.  She also had some urinary incontinence and neurogenic bladder.  MRI and imaging revealed tumor, recurrence of T3 with severe spinal cord compression.   Underwent posterior spinal tumor resection 09/03/2017 per Dr. Christella Noa.  Radiation Oncology followup plans for radiation therapy outpatient.  Decadron protocol is indicated.    HOSPITAL COURSE:  E. coli UTI, placed on Rocephin.  Physical and occupational therapy evaluations completed and the patient was admitted for comprehensive rehabilitation program 09/08/2017.    Follow up labs on admission to rehabilitation service showed hemoglobin 6.2.  She was transfused.  Initial plan was to resume Xarelto for  history of pulmonary emboli, but held due to anemia.  CT lumbar and thoracic spine unremarkable.  Hemoglobin  rebounded at 8.7.  The patient with ongoing complaints of left knee pain and effusion.  X-rays and imaging revealed left femoral shaft fracture, likely felt to be subacute with noted history of recent fall.  Orthopedic service is consulted.  Underwent  intramedullary nailing 09/11/2017 per Dr. Marcelino Scot, nonweightbearing.  Her Xarelto was resumed postoperatively.  Close monitoring of hemoglobin.  Physical and occupational therapies again completed postoperatively 09/12/2017.  Admitted to inpatient  rehabilitation services 09/15/2017.  Upon admission to the inpatient rehabilitation services Intractable nausea, vomiting, constipation.  Discharged back to Acute Care Services.  Abdominal films showed no obstructive bowel gas pattern, moderate stool  volume indicating constipation.  Ultrasound without complicating features.  Bowel program established nausea, vomiting greatly improved.  She was again readmitted for inpatient rehabilitation program.  PAST MEDICAL HISTORY:  See discharge diagnoses.  SOCIAL HISTORY:  Lives with sister and brother-in-law all.   FUNCTIONAL STATUS:  Upon admission to rehab services was max assist lateral scoot transfers, +2 physical assist in supine to sit, max total assist activities of daily living.  PHYSICAL EXAMINATION: VITAL SIGNS:  Blood pressure 128/90, pulse 86, temperature 97, respirations 18. GENERAL:  Alert female in no acute distress.  EOMs intact. NECK:  Supple, nontender, no JVD. CARDIOVASCULAR:  Rate controlled. ABDOMEN:  Soft, nontender, good bowel sounds. LUNGS:  Clear to auscultation without wheeze.  REHABILITATION HOSPITAL COURSE:  The patient was admitted to inpatient rehabilitation services.  Therapies initiated on a 3-hour daily basis, consisting of physical therapy, occupational therapy and rehabilitation nursing.  The  following issues were   addressed during patient's rehabilitation stay:   Pertaining to the patient's myelopathy, paraplegia secondary to metastatic Hurthle cancer, thoracic spine, recurrent spinal cord tumor resection, latest 09/03/2017 by neurosurgery, Dr. Christella Noa.  She would follow up with neurosurgery.  Follow up with  Radiation Oncology Services at the recommendations of neurosurgery with thoracic CT myelogram completed 10/26/2017 demonstrating severe T3 stenosis residual tumor, some tumor potentially in the lumbar spine.  This would be addressed by neurosurgery as  well as Oncology Services.  Await plan for radiation therapy.  She remained on Xarelto for pulmonary emboli.  No bleeding episodes.    Pain management with the use of baclofen 20 mg t.i.d. for spasticity.  Continued Tylenol as needed.    Bouts of constipation, neurogenic bowel and bladder.  Bowel program established.  She did receive treatment for a gram-negative rod urinary tract infection with Bactrim.  Plan was to place a Foley catheter tube at time of discharge.    She remained on Synthroid for hypothyroidism.    Zoloft ongoing for mood stabilization.  She was tolerating and attending full therapies.    Acute on chronic anemia at 8.    In regard to left femoral shaft fracture, nonweightbearing.  Follow up Orthopedic Services, Dr. Marcelino Scot after IM nailing.  Patient with some complaints right ear discomfort with manipulation and exam of inner ear showed whitish fluid behind TM.  Placed on amoxicillin for suspected otitis media x7 days  The patient received weekly collaborative interdisciplinary team conferences to discuss estimated length of stay, family teaching, any barriers to her discharge.  Sessions focused on bed mobility and transfers with focus on using assistive device with  decreased burden of care and standing tolerance for weightbearing.  Required minimal assist to manage lower extremities with rolling.  Rolls left to right for donning her  pants with minimal assistance.  Leg loops don total assist for time management.   Completes upper body dressing.  Wheelchair grooming at sink side modified independence.  Due to limited assistance at home, skilled nursing facility was needed.  Bed becoming available 10/30/2017.  DISCHARGE MEDICATIONS:  Included baclofen 20 mg p.o. t.i.d., Dulcolax suppository daily, Pepcid 20 mg p.o. b.i.d., Flonase 1 spray each nostril daily, Synthroid 137 mcg daily, Claritin 10 mg p.o. daily, Protonix 40 mg p.o. daily, MiraLax daily, Xarelto  20 mg p.o. daily, Senokot 2 tablets p.o. b.i.d., Zoloft 25 mg p.o. daily, Carafate 1 gram t.i.d., Tylenol as needed, Fleets enema daily as needed, amoxicillin 500 mg every 8 hours x7 days.  Her diet was regular.  The patient would follow up with Dr. Alger Simons at the outpatient rehab service office as directed.  Dr. Ashok Pall,  neurosurgery, call for appointment; Dr. Altamese Baltic.  Dr. Penelope Coop, Oncology Services.  Nonweightbearing left lower  extremity.  AN/NUANCE D:10/29/2017 T:10/29/2017 JOB:[id number]

## 2017-10-29 NOTE — Discharge Summary (Signed)
Discharge summary job # [id number]

## 2017-10-29 NOTE — Progress Notes (Signed)
Social Work Patient ID: Sarah [name], female   DOB: 1966-01-26, 52 y.o.   MRN: [id number]  Have received SNF bed offer from [facility] today and pt/family have accepted this with plan to transfer to facility tomorrow.  Have alerted PA/MD and tx team.  Lennart Pall, LCSW

## 2017-10-29 NOTE — Plan of Care (Signed)
Goal not met secondary to pt needing total A or 2 helpers for toileting needs

## 2017-10-29 NOTE — Progress Notes (Signed)
Subjective/Complaints: Feeling better today. No problems overnight.   ROS: Patient denies fever, rash, sore throat, blurred vision, nausea, vomiting, diarrhea, cough, shortness of breath or chest pain, joint or back pain, headache, or mood change.    Objective: Vital Signs: Blood pressure 137/79, pulse 63, temperature 97.6 F (36.4 C), temperature source Oral, resp. rate 16, height 5\' 11"  (1.803 m), weight 103.2 kg, last menstrual period 10/22/2016, SpO2 100 %. No results found. No results found. However, due to the size of the patient record, not all encounters were searched. Please check Results Review for a complete set of results.   Constitutional: No distress . Vital signs reviewed. HEENT: EOMI, oral membranes moist Neck: supple Cardiovascular: RRR without murmur. No JVD    Respiratory: CTA Bilaterally without wheezes or rales. Normal effort    GI: BS +, non-tender, non-distended  Neuro:  alert  Motor: bilateral upper extremity 5/5 proximal distal Bilateral lower extremities 0/5 proximal to distal, sensory level at lower sternum---no motor or sensory changes. Tone 1-3/4 in LE's--tone has shown some improvement Musc/Skel:  No edema or tenderness in extremities Psych:pleasant Skin: Warm and dry   Assessment/Plan: 1. Functional deficits secondary to T3 paraplegia which require 3+ hours per day of interdisciplinary therapy in a comprehensive inpatient rehab setting. Physiatrist is providing close team supervision and 24 hour management of active medical problems listed below. Physiatrist and rehab team continue to assess barriers to discharge/monitor patient progress toward functional and medical goals. FIM: Function - Bathing Position: Bed Body parts bathed by patient: Right arm, Left arm, Chest, Abdomen, Right upper leg, Left upper leg, Right lower leg, Left lower leg Body parts bathed by helper: Back Bathing not applicable: Front perineal area, Buttocks Assist Level: Set  up, Supervision or verbal cues Assistive Device Comment: LH sponge and reacher Set up : To obtain items  Function- Upper Body Dressing/Undressing What is the patient wearing?: Pull over shirt/dress Bra - Perfomed by patient: Thread/unthread right bra strap, Thread/unthread left bra strap Bra - Perfomed by helper: Hook/unhook bra (pull down sports bra) Pull over shirt/dress - Perfomed by patient: Thread/unthread right sleeve, Thread/unthread left sleeve, Put head through opening, Pull shirt over trunk Pull over shirt/dress - Perfomed by helper: Pull shirt over trunk Assist Level: Set up, Supervision or verbal cues Set up : To obtain clothing/put away Function - Lower Body Dressing/Undressing What is the patient wearing?: Ted Hose Position: Bed Pants- Performed by patient: Thread/unthread right pants leg, Thread/unthread left pants leg, Pull pants up/down Pants- Performed by helper: Pull pants up/down Shoes - Performed by helper: Don/doff right shoe, Don/doff left shoe, Fasten right, Fasten left TED Hose - Performed by helper: Don/doff right TED hose, Don/doff left TED hose Assist for footwear: Dependant Assist for lower body dressing: Set up, Supervision or verbal cues Set up : Don/doff TED stockings  Function - Toileting Toileting activity did not occur: No continent bowel/bladder event Toileting steps completed by patient: Adjust clothing prior to toileting, Performs perineal hygiene, Adjust clothing after toileting Toileting steps completed by helper: Adjust clothing after toileting, Performs perineal hygiene, Adjust clothing prior to toileting Assist level: Two helpers  Function - Air cabin crew transfer activity did not occur: Safety/medical concerns Toilet transfer assistive device: Bedside commode Assist level to toilet: 2 helpers(per Wyvonnia Lora, NT) Assist level from toilet: 2 helpers Assist level to bedside commode (at bedside): Maximal assist (Pt 25 - 49%/lift  and lower) Assist level from bedside commode (at bedside): 2 helpers  Function -  Chair/bed transfer Chair/bed transfer activity did not occur: N/A Chair/bed transfer method: Lateral scoot Chair/bed transfer assist level: Moderate assist (Pt 50 - 74%/lift or lower) Chair/bed transfer assistive device: Sliding board Chair/bed transfer details: Manual facilitation for placement, Manual facilitation for weight shifting, Tactile cues for sequencing, Verbal cues for safe use of DME/AE  Function - Locomotion: Wheelchair Will patient use wheelchair at discharge?: Yes Type: Manual Max wheelchair distance: 118 Assist Level: No help, No cues, assistive device, takes more than reasonable amount of time Wheel 50 feet with 2 turns activity did not occur: Safety/medical concerns Assist Level: No help, No cues, assistive device, takes more than reasonable amount of time Wheel 150 feet activity did not occur: Safety/medical concerns Assist Level: No help, No cues, assistive device, takes more than reasonable amount of time Turns around,maneuvers to table,bed, and toilet,negotiates 3% grade,maneuvers on rugs and over doorsills: Yes Function - Locomotion: Ambulation Ambulation activity did not occur: Safety/medical concerns Walk 10 feet activity did not occur: Safety/medical concerns Walk 50 feet with 2 turns activity did not occur: Safety/medical concerns Walk 150 feet activity did not occur: Safety/medical concerns Walk 10 feet on uneven surfaces activity did not occur: Safety/medical concerns  Function - Comprehension Comprehension: Auditory Comprehension assist level: Understands complex 90% of the time/cues 10% of the time  Function - Expression Expression: Verbal Expression assist level: Expresses basic needs/ideas: With no assist  Function - Social Interaction Social Interaction assist level: Interacts appropriately with others with medication or extra time (anti-anxiety,  antidepressant).  Function - Problem Solving Problem solving assist level: Solves basic problems with no assist  Function - Memory Memory assist level: Recognizes or recalls 90% of the time/requires cueing < 10% of the time Patient normally able to recall (first 3 days only): Current season, Location of own room, Staff names and faces, That he or she is in a hospital   Medical Problem List and Plan: 1.  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  Cont CIR, dc bedrest?  -SNF now pending  -s/p thoracic CT myelogram 8/5--demonstrates severe T3 stenosis and residual tumor. Some tumor potentially in lumbar spine as well. 2.  DVT Prophylaxis/Anticoagulation/history of pulmonary emboli: Xarelto.  Monitor for any bleeding episodes 3. Pain Management/spasticity:   -increased baclofen to 20mg  qid which by exam and therapy/pt reports seems to be helping tone   -continue tramadol for more severe pain  -continue tylenol prn  -off tizanidine 4. Mood: Zoloft 25 mg daily.  Provide emotional support 5. Neuropsych: This patient is capable of making decisions on her own behalf. 6. Skin/Wound Care:  Left knee incision well healed 7. Fluids/Electrolytes/Nutrition:  8.  Left femoral shaft fracture.  Status post intramedullary retrograde nailing 09/11/2017.  WBAT per ortho 9.  Acute blood loss anemia/pancytopenia.    Hemoglobin up to 8.0 8/5    10.  Hyperglycemia secondary to steroid.  improved  -dc'ed cbg checks and SSI 11.  Neurogenic bowel/ bladder.    - PM bowel program with some improvement  -Stopped Ditropan  -100k GNR   bactrim DS completed  -place foley at d/c   -monitor for further nausea 12.  Hypothyroidism.  Synthroid 13. Thrombocytopenia -Resolved 14.  Sinus symptoms improved-  = flonase, added ocean nasal spray  claritin   LOS (Days) 38 A FACE TO FACE EVALUATION WAS  PERFORMED  [name] 10/29/2017, 8:59 AM

## 2017-10-29 NOTE — Progress Notes (Signed)
Physical Therapy Session Note  Patient Details  Name: Sarah [name] MRN: [id number] Date of Birth: [birth year]  Today's Date: 10/29/2017 PT Individual Time: 0915-1000 PT Individual Time Calculation (min): 45 min   Short Term Goals: Week 5:  PT Short Term Goal 1 (Week 5): =LTGs due to ELOS  Skilled Therapeutic Interventions/Progress Updates:    pt on phone and very upset when PT arrived, returned 15 minutes later and pt still upset, stating she was told her cancer may have spread to her lumbar spine but had not heard from her oncologist to confirm.  PT provided emotional support and pt agreeable to therapy session.  Session focus on bed mobility and transfers with focus on using AD to decrease burden of care, and standing tolerance for weight bearing.    Pt requires min assist to manage LEs with rolling today 2/2 increased tone.  Rolls L/R for donning pants with min assist.  Leg loops donned total assist for time management.  Supine>sit with leg loops and bed features, min assist for LE. Slide board transfer to w/c on pt's R with initial mod assist, fade to min assist with pt encouraged to count 1-2-3.  Pt completes upper body dressing in w/c with set up assist, and grooming at sink mod I.  Sit<>stand with standing frame and pt able to tolerate 3 minutes standing, stating she feels a little nauseated after 2 minutes, but improves with fan to cool down.  Handoff to OT.   Therapy Documentation Precautions:  Precautions Precautions: Fall Precaution Comments: throracic region, LEs spasicity  Restrictions Weight Bearing Restrictions: Yes LLE Weight Bearing: Touchdown weight bearing Other Position/Activity Restrictions: B LEs(Order updated 10/14/17) General: PT Amount of Missed Time (min): 15 Minutes PT Missed Treatment Reason: Other (Comment)(pt received bad news and very upset)   See Function Navigator for Current Functional Status.   Therapy/Group: Individual Therapy  Michel Santee 10/29/2017, 10:30 AM

## 2017-10-29 NOTE — Progress Notes (Signed)
Patient with nausea and malaise. Has been feeling down this week and questions anxiety component.  Also reporting HA, facial fullness and right ear pain to nurse.  Right ear discomfort with manipulation and exam of inner ear showed whitish fluid behind TM. Will start amoxicillin tid for otitis media. Ego support provided regarding upcoming discharge as well as upcoming XRT treatments.

## 2017-10-29 NOTE — Progress Notes (Signed)
Occupational Therapy Session Note  Patient Details  Name: Sarah [name] MRN: [id number] Date of Birth: 1965/08/07  Today's Date: 10/29/2017 OT Individual Time: 0700-0810 and 1000-1045 OT Individual Time Calculation (min): 70 min and 45 min   Short Term Goals: Week 5:  OT Short Term Goal 1 (Week 5): STGs=LTGs due to ELOS  Skilled Therapeutic Interventions/Progress Updates:    Session 1:  Upon entering the room, pt supine in bed with no c/o pain this session. However, pt does continue to feel nauseated again this morning. Pt attempting to eat breakfast in order to take medications. Pt also reports increase in anxiety over current situation and reports not sleeping well at night. This could also be why she has been nauseated as well. Pt and therapist discussed several ways to relax and practice "mental health self care" . Pt verbalized wanting to listen to audiobooks to "take her mind off of things" , journal, and use of aromatherapy. OT verbalized various ways she could incorporate these strategies into day. Pt remained in bed at end of session but reports feeling better and encouraged. Plans made for next session. Call bell and all needed items within reach upon exiting the room.   Session 2: Pt transitioned easily from PT session. Pt in dayroom and standing frame and able to tolerate standing for 4 minutes and 15 seconds! Pt very excited because this is the longest she has tolerated standing. Pt returned to wheelchair and leaning forward with min A to remove harness and min A to reposition in wheelchair. Pt propelled wheelchair to gift shop at mod I level and navigated aisles and able to open cooler to obtain drink. She paid for purchases and exited shop at overall mod I level. Pt returning to unit in same manner as above. Pt remained seated wheelchair with call bell within reach.   Therapy Documentation Precautions:  Precautions Precautions: Fall Precaution Comments: throracic region, LEs  spasicity  Restrictions Weight Bearing Restrictions: Yes LLE Weight Bearing: Touchdown weight bearing Other Position/Activity Restrictions: B LEs(Order updated 10/14/17) ADL: ADL ADL Comments: see functional navigator  See Function Navigator for Current Functional Status.   Therapy/Group: Individual Therapy  [name] 10/29/2017, 9:17 AM

## 2017-10-30 ENCOUNTER — Inpatient Hospital Stay (HOSPITAL_COMMUNITY): Payer: Medicare Other | Admitting: Occupational Therapy

## 2017-10-30 DIAGNOSIS — G8100 Flaccid hemiplegia affecting unspecified side: Secondary | ICD-10-CM | POA: Diagnosis not present

## 2017-10-30 DIAGNOSIS — E039 Hypothyroidism, unspecified: Secondary | ICD-10-CM | POA: Diagnosis not present

## 2017-10-30 DIAGNOSIS — Z7901 Long term (current) use of anticoagulants: Secondary | ICD-10-CM | POA: Diagnosis not present

## 2017-10-30 DIAGNOSIS — R52 Pain, unspecified: Secondary | ICD-10-CM | POA: Diagnosis not present

## 2017-10-30 DIAGNOSIS — K5900 Constipation, unspecified: Secondary | ICD-10-CM | POA: Diagnosis not present

## 2017-10-30 DIAGNOSIS — R279 Unspecified lack of coordination: Secondary | ICD-10-CM | POA: Diagnosis not present

## 2017-10-30 DIAGNOSIS — C7802 Secondary malignant neoplasm of left lung: Secondary | ICD-10-CM | POA: Diagnosis not present

## 2017-10-30 DIAGNOSIS — Z743 Need for continuous supervision: Secondary | ICD-10-CM | POA: Diagnosis not present

## 2017-10-30 DIAGNOSIS — R945 Abnormal results of liver function studies: Secondary | ICD-10-CM | POA: Diagnosis not present

## 2017-10-30 DIAGNOSIS — Z79899 Other long term (current) drug therapy: Secondary | ICD-10-CM | POA: Diagnosis not present

## 2017-10-30 DIAGNOSIS — M1288 Other specific arthropathies, not elsewhere classified, other specified site: Secondary | ICD-10-CM | POA: Diagnosis not present

## 2017-10-30 DIAGNOSIS — C73 Malignant neoplasm of thyroid gland: Secondary | ICD-10-CM | POA: Diagnosis not present

## 2017-10-30 DIAGNOSIS — L989 Disorder of the skin and subcutaneous tissue, unspecified: Secondary | ICD-10-CM | POA: Diagnosis not present

## 2017-10-30 DIAGNOSIS — G959 Disease of spinal cord, unspecified: Secondary | ICD-10-CM | POA: Diagnosis not present

## 2017-10-30 DIAGNOSIS — Z4789 Encounter for other orthopedic aftercare: Secondary | ICD-10-CM | POA: Diagnosis not present

## 2017-10-30 DIAGNOSIS — Z510 Encounter for antineoplastic radiation therapy: Secondary | ICD-10-CM | POA: Diagnosis not present

## 2017-10-30 DIAGNOSIS — G992 Myelopathy in diseases classified elsewhere: Secondary | ICD-10-CM | POA: Diagnosis not present

## 2017-10-30 DIAGNOSIS — K6289 Other specified diseases of anus and rectum: Secondary | ICD-10-CM | POA: Diagnosis not present

## 2017-10-30 DIAGNOSIS — R531 Weakness: Secondary | ICD-10-CM | POA: Diagnosis not present

## 2017-10-30 DIAGNOSIS — D500 Iron deficiency anemia secondary to blood loss (chronic): Secondary | ICD-10-CM | POA: Diagnosis not present

## 2017-10-30 DIAGNOSIS — R1033 Periumbilical pain: Secondary | ICD-10-CM | POA: Diagnosis not present

## 2017-10-30 DIAGNOSIS — S72332D Displaced oblique fracture of shaft of left femur, subsequent encounter for closed fracture with routine healing: Secondary | ICD-10-CM | POA: Diagnosis not present

## 2017-10-30 DIAGNOSIS — C7931 Secondary malignant neoplasm of brain: Secondary | ICD-10-CM | POA: Diagnosis not present

## 2017-10-30 DIAGNOSIS — I10 Essential (primary) hypertension: Secondary | ICD-10-CM | POA: Diagnosis not present

## 2017-10-30 DIAGNOSIS — G8220 Paraplegia, unspecified: Secondary | ICD-10-CM | POA: Diagnosis not present

## 2017-10-30 DIAGNOSIS — Z86711 Personal history of pulmonary embolism: Secondary | ICD-10-CM | POA: Diagnosis not present

## 2017-10-30 DIAGNOSIS — N319 Neuromuscular dysfunction of bladder, unspecified: Secondary | ICD-10-CM | POA: Diagnosis not present

## 2017-10-30 DIAGNOSIS — H6691 Otitis media, unspecified, right ear: Secondary | ICD-10-CM | POA: Diagnosis not present

## 2017-10-30 DIAGNOSIS — R1011 Right upper quadrant pain: Secondary | ICD-10-CM | POA: Diagnosis not present

## 2017-10-30 DIAGNOSIS — C7801 Secondary malignant neoplasm of right lung: Secondary | ICD-10-CM | POA: Diagnosis not present

## 2017-10-30 DIAGNOSIS — C778 Secondary and unspecified malignant neoplasm of lymph nodes of multiple regions: Secondary | ICD-10-CM | POA: Diagnosis not present

## 2017-10-30 DIAGNOSIS — M6281 Muscle weakness (generalized): Secondary | ICD-10-CM | POA: Diagnosis not present

## 2017-10-30 DIAGNOSIS — N390 Urinary tract infection, site not specified: Secondary | ICD-10-CM | POA: Diagnosis not present

## 2017-10-30 DIAGNOSIS — K592 Neurogenic bowel, not elsewhere classified: Secondary | ICD-10-CM | POA: Diagnosis not present

## 2017-10-30 DIAGNOSIS — C7951 Secondary malignant neoplasm of bone: Secondary | ICD-10-CM | POA: Diagnosis not present

## 2017-10-30 DIAGNOSIS — S72309D Unspecified fracture of shaft of unspecified femur, subsequent encounter for closed fracture with routine healing: Secondary | ICD-10-CM | POA: Diagnosis not present

## 2017-10-30 MED ORDER — SERTRALINE HCL 50 MG PO TABS: 50.0000 mg | ORAL_TABLET | Freq: Every day | ORAL | Status: DC

## 2017-10-30 MED ORDER — SERTRALINE HCL 50 MG PO TABS
25.0000 mg | ORAL_TABLET | Freq: Once | ORAL | Status: AC
  Administered 2017-10-30: 25 mg via ORAL
  Filled 2017-10-30: qty 1

## 2017-10-30 MED ORDER — AMOXICILLIN 500 MG PO CAPS: 500.0000 mg | ORAL_CAPSULE | Freq: Three times a day (TID) | ORAL | Status: DC

## 2017-10-30 NOTE — Progress Notes (Signed)
Pt to DC to [facility] in [place]. EMS to transport to facility. 2 bags of belongings transported with pt. Family to pick up rest of belongings and WC. Pt denies pain at this time. Will call to give report to facility. Pt cont to deny foley. Explained importance and pt understood. Will notify facility as well. PA is aware.   Erie Noe, LPN

## 2017-10-30 NOTE — Progress Notes (Signed)
Subjective/Complaints: Feeling better today. No problems overnight.   ROS: Patient denies fever, rash, sore throat, blurred vision, nausea, vomiting, diarrhea, cough, shortness of breath or chest pain, joint or back pain, headache, or mood change.    Objective: Vital Signs: Blood pressure 126/74, pulse 75, temperature 97.9 F (36.6 C), temperature source Oral, resp. rate 20, height 5\' 11"  (1.803 m), weight 103.2 kg, last menstrual period 10/22/2016, SpO2 100 %. Dg Abd 1 View  Result Date: 10/29/2017 CLINICAL DATA:  Increased nausea and vomiting. History of paraplegia, obesity. EXAM: ABDOMEN - 1 VIEW COMPARISON:  None. FINDINGS: No dilated bowel loops appreciated. No convincing evidence of free intraperitoneal air, soft tissue mass or abnormal fluid collection. No evidence of renal or ureteral calculi. No acute or suspicious osseous finding. IMPRESSION: No acute findings.  Grossly nonobstructive bowel gas pattern. Electronically Signed   By: Franki Cabot M.D.   On: 10/29/2017 14:28   No results found. However, due to the size of the patient record, not all encounters were searched. Please check Results Review for a complete set of results.   Constitutional: No distress . Vital signs reviewed. HEENT: EOMI, oral membranes moist Neck: supple Cardiovascular: RRR without murmur. No JVD    Respiratory: CTA Bilaterally without wheezes or rales. Normal effort    GI: BS +, non-tender, non-distended  Neuro:  alert  Motor: bilateral upper extremity 5/5 proximal distal Bilateral lower extremities 0/5 proximal to distal, sensory level at lower sternum---no motor or sensory changes. Tone 1-3/4 in LE's--tone has shown some improvement Musc/Skel:  No edema or tenderness in extremities Psych:pleasant Skin: Warm and dry   Assessment/Plan: 1. Functional deficits secondary to T3 paraplegia which require 3+ hours per day of interdisciplinary therapy in a comprehensive inpatient rehab  setting. Physiatrist is providing close team supervision and 24 hour management of active medical problems listed below. Physiatrist and rehab team continue to assess barriers to discharge/monitor patient progress toward functional and medical goals. FIM: Function - Bathing Position: Bed Body parts bathed by patient: Right arm, Left arm, Chest, Abdomen, Right upper leg, Left upper leg, Right lower leg, Left lower leg Body parts bathed by helper: Back Bathing not applicable: Front perineal area, Buttocks Assist Level: Set up, Supervision or verbal cues Assistive Device Comment: LH sponge and reacher Set up : To obtain items  Function- Upper Body Dressing/Undressing What is the patient wearing?: Pull over shirt/dress Bra - Perfomed by patient: Thread/unthread right bra strap, Thread/unthread left bra strap Bra - Perfomed by helper: Hook/unhook bra (pull down sports bra) Pull over shirt/dress - Perfomed by patient: Thread/unthread right sleeve, Thread/unthread left sleeve, Put head through opening, Pull shirt over trunk Pull over shirt/dress - Perfomed by helper: Pull shirt over trunk Assist Level: Set up Set up : To obtain clothing/put away Function - Lower Body Dressing/Undressing What is the patient wearing?: Pants, Maryln Manuel, Shoes Position: Bed Pants- Performed by patient: Thread/unthread right pants leg, Thread/unthread left pants leg, Pull pants up/down Pants- Performed by helper: Pull pants up/down Shoes - Performed by helper: Don/doff right shoe, Don/doff left shoe, Fasten right, Fasten left TED Hose - Performed by helper: Don/doff right TED hose, Don/doff left TED hose Assist for footwear: Dependant Assist for lower body dressing: Set up, Supervision or verbal cues Set up : Don/doff TED stockings  Function - Toileting Toileting activity did not occur: No continent bowel/bladder event Toileting steps completed by patient: Adjust clothing prior to toileting, Performs perineal  hygiene, Adjust clothing after toileting Toileting  steps completed by helper: Adjust clothing after toileting, Performs perineal hygiene, Adjust clothing prior to toileting Assist level: Two helpers  Function - Air cabin crew transfer activity did not occur: Safety/medical concerns Toilet transfer assistive device: Bedside commode Assist level to toilet: 2 helpers(per Wyvonnia Lora, NT) Assist level from toilet: 2 helpers Assist level to bedside commode (at bedside): Maximal assist (Pt 25 - 49%/lift and lower) Assist level from bedside commode (at bedside): 2 helpers  Function - Chair/bed transfer Chair/bed transfer activity did not occur: N/A Chair/bed transfer method: Lateral scoot Chair/bed transfer assist level: Moderate assist (Pt 50 - 74%/lift or lower) Chair/bed transfer assistive device: Armrests, Sliding board Chair/bed transfer details: Manual facilitation for placement, Manual facilitation for weight shifting, Tactile cues for sequencing, Verbal cues for safe use of DME/AE  Function - Locomotion: Wheelchair Will patient use wheelchair at discharge?: Yes Type: Manual Max wheelchair distance: 150 Assist Level: No help, No cues, assistive device, takes more than reasonable amount of time Wheel 50 feet with 2 turns activity did not occur: Safety/medical concerns Assist Level: No help, No cues, assistive device, takes more than reasonable amount of time Wheel 150 feet activity did not occur: Safety/medical concerns Assist Level: No help, No cues, assistive device, takes more than reasonable amount of time Turns around,maneuvers to table,bed, and toilet,negotiates 3% grade,maneuvers on rugs and over doorsills: Yes Function - Locomotion: Ambulation Ambulation activity did not occur: Safety/medical concerns Walk 10 feet activity did not occur: Safety/medical concerns Walk 50 feet with 2 turns activity did not occur: Safety/medical concerns Walk 150 feet activity did not  occur: Safety/medical concerns Walk 10 feet on uneven surfaces activity did not occur: Safety/medical concerns  Function - Comprehension Comprehension: Auditory Comprehension assist level: Understands complex 90% of the time/cues 10% of the time  Function - Expression Expression: Verbal Expression assist level: Expresses basic needs/ideas: With no assist  Function - Social Interaction Social Interaction assist level: Interacts appropriately with others with medication or extra time (anti-anxiety, antidepressant).  Function - Problem Solving Problem solving assist level: Solves basic problems with no assist  Function - Memory Memory assist level: Recognizes or recalls 90% of the time/requires cueing < 10% of the time Patient normally able to recall (first 3 days only): Current season, Location of own room, Staff names and faces, That he or she is in a hospital   Medical Problem List and Plan: 1.  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  Cont CIR, dc bedrest?  -SNF today  -follow up with me in 4- weeks  -s/p thoracic CT myelogram 8/5--demonstrates severe T3 stenosis and residual tumor. Some tumor potentially in lumbar spine as well. 2.  DVT Prophylaxis/Anticoagulation/history of pulmonary emboli: Xarelto.  Monitor for any bleeding episodes 3. Pain Management/spasticity:   -increased baclofen to 20mg  qid which by exam and therapy/pt reports seems to be helping tone   -continue tramadol for more severe pain  -continue tylenol prn  -off tizanidine 4. Mood: Zoloft 25 mg daily.  Provide emotional support  -having some ongoing anxiety---increase zoloft to 50mg  5. Neuropsych: This patient is capable of making decisions on her own behalf. 6. Skin/Wound Care:  Left knee incision well healed 7. Fluids/Electrolytes/Nutrition:  8.  Left femoral shaft fracture.  Status post  intramedullary retrograde nailing 09/11/2017.  WBAT per ortho 9.  Acute blood loss anemia/pancytopenia.    Hemoglobin up to 8.0 8/5    10.  Hyperglycemia secondary to steroid.  improved  -dc'ed cbg checks and SSI 11.  Neurogenic bowel/ bladder.    - PM bowel program with some improvement  -Stopped Ditropan  -100k GNR   bactrim DS completed  -place foley at d/c   -monitor for further nausea 12.  Hypothyroidism.  Synthroid 13. Thrombocytopenia -Resolved 14.  Sinus symptoms improved-  = flonase, added ocean nasal spray  claritin   LOS (Days) 39 A FACE TO FACE EVALUATION WAS PERFORMED  [name] 10/30/2017, 9:45 AM

## 2017-10-30 NOTE — Progress Notes (Signed)
[name] refusing the foley cath at time. Spoke with Linna Hoff, [place] and he is aware. I will attempt later today to see if patient has changed her mind before being discharged. Nicholes Rough, LPN

## 2017-10-30 NOTE — Progress Notes (Signed)
Social Work  Discharge Note  The overall goal for the admission was met for:   Discharge location: No - plan changed to SNF as family cannot meet current care needs.  Length of Stay: No - extended  due to change in d/c plan and bed search - total LOS = 39 days  Discharge activity level: No -goals had to be downgraded  Home/community participation: No  Services provided included: MD, RD, PT, OT, RN, TR, Pharmacy, Neuropsych and [place]: [facility]  Follow-up services arranged: Other: SNF at [place]  Comments (or additional information):  Patient/Family verbalized understanding of follow-up arrangements:NA  Individual responsible for coordination of the follow-up plan: pt  Confirmed correct DME delivered: NA    Sarah [name]

## 2017-10-30 NOTE — Plan of Care (Signed)
  Problem: SCI BOWEL ELIMINATION Goal: RH STG MANAGE BOWEL WITH ASSISTANCE Description STG Manage Bowel with mod Assistance.  Outcome: Progressing Goal: RH STG SCI MANAGE BOWEL WITH MEDICATION WITH ASSISTANCE Description STG SCI Manage bowel with medication with mod assistance.  Outcome: Progressing Goal: RH STG SCI MANAGE BOWEL PROGRAM W/ASSIST OR AS APPROPRIATE Description STG SCI Manage bowel program w/assist mod   Outcome: Progressing   Problem: SCI BLADDER ELIMINATION Goal: RH STG MANAGE BLADDER WITH ASSISTANCE Description STG Manage Bladder With mod Assistance  Outcome: Progressing   Problem: RH SAFETY Goal: RH STG ADHERE TO SAFETY PRECAUTIONS W/ASSISTANCE/DEVICE Description STG Adhere to Safety Precautions With min Assistance/Device.  Outcome: Progressing   Problem: RH PAIN MANAGEMENT Goal: RH STG PAIN MANAGED AT OR BELOW PT'S PAIN GOAL Description Pain < or = 4 with min assistance  Outcome: Progressing

## 2017-11-02 ENCOUNTER — Telehealth: Payer: Self-pay | Admitting: Radiation Oncology

## 2017-11-02 DIAGNOSIS — C73 Malignant neoplasm of thyroid gland: Secondary | ICD-10-CM | POA: Diagnosis not present

## 2017-11-02 DIAGNOSIS — G8100 Flaccid hemiplegia affecting unspecified side: Secondary | ICD-10-CM | POA: Diagnosis not present

## 2017-11-02 DIAGNOSIS — I10 Essential (primary) hypertension: Secondary | ICD-10-CM | POA: Diagnosis not present

## 2017-11-02 NOTE — Telephone Encounter (Signed)
Phoned patient's sister, [name], back. Explained that per Dr. [name] he doesn't see any mention of new metastasis anywhere on the MRI report and he believes this was a mix up of communication. [name] reports that Dr. Christella Noa called her on Saturday and confirmed the safe. [name] expressed appreciation for the return call and clarification.

## 2017-11-03 ENCOUNTER — Other Ambulatory Visit: Payer: Self-pay | Admitting: Urology

## 2017-11-03 DIAGNOSIS — C7951 Secondary malignant neoplasm of bone: Secondary | ICD-10-CM | POA: Diagnosis not present

## 2017-11-03 DIAGNOSIS — C73 Malignant neoplasm of thyroid gland: Secondary | ICD-10-CM | POA: Diagnosis not present

## 2017-11-03 DIAGNOSIS — C7931 Secondary malignant neoplasm of brain: Secondary | ICD-10-CM | POA: Diagnosis not present

## 2017-11-03 DIAGNOSIS — Z510 Encounter for antineoplastic radiation therapy: Secondary | ICD-10-CM | POA: Diagnosis not present

## 2017-11-05 ENCOUNTER — Ambulatory Visit
Admission: RE | Admit: 2017-11-05 | Discharge: 2017-11-05 | Disposition: A | Discharge status: Home / Self Care | Payer: Medicare Other | Source: Ambulatory Visit | Attending: Radiation Oncology | Admitting: Radiation Oncology

## 2017-11-05 ENCOUNTER — Telehealth: Payer: Self-pay | Admitting: Radiation Oncology

## 2017-11-05 VITALS — BP 128/91 | HR 79 | Temp 97.7°F | Resp 20

## 2017-11-05 DIAGNOSIS — C73 Malignant neoplasm of thyroid gland: Secondary | ICD-10-CM

## 2017-11-05 DIAGNOSIS — C7951 Secondary malignant neoplasm of bone: Secondary | ICD-10-CM | POA: Diagnosis not present

## 2017-11-05 DIAGNOSIS — Z510 Encounter for antineoplastic radiation therapy: Secondary | ICD-10-CM | POA: Diagnosis not present

## 2017-11-05 NOTE — Telephone Encounter (Signed)
Patient phoned to inquire if she could eat before her [place] treatment at 1545 today. Phoned patient back promptly and confirmed she could "that there are no restrictions. She verbalized understanding and expressed appreciation for the call back.

## 2017-11-05 NOTE — Progress Notes (Signed)
Sarah [name] Kitchen    HEMATOLOGY/ONCOLOGY CLINIC NOTE  Date of Service: 08/24/17   Patient Care Team: Sarah Skiff, MD as PCP - General (Family Medicine) Sarah Staggers, MD as Consulting Physician (Physical Medicine and Rehabilitation) Sarah Pall, MD as Consulting Physician (Neurosurgery) Sarah Genera, MD as Consulting Physician (Hematology) Sarah Pi, MD as Consulting Physician (Endocrinology)   Sarah Bailiff MD ([facility] [place]) [phone]. 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  01/09/2014- patient underwent total thyroidectomy by Dr. Elie [name]. Pathology showed Hurthle cell carcinoma with no perineural invasion, presence of lymphovascular invasion and extra thyroidal extension. p T3, p N0, Mx  Received 30 grays in 10 fractions to her C7-T6 lesion finished in November 2015  Received 135 mCi of iodine 131 on 07/21/2014. Whole-body scan was apparently negative.  07/05/2015-07/10/2015 admitted to [facility] for leg weakness. MRI of the T-spine showed recurrent tumor causing spinal cord compression. Patient was emergently taken to the or and underwent a redo thoracic laminectomy of T3 and partial tumor resection.   08/07/2015 PET/CT scan of the chest abdomen pelvis showed slight enlargement of FDG avid left level III and level IV lymph nodes concerning for worsening nodal metastases. Slight enlargement of the hypermetabolic right superior mediastinal mass and multiple metabolically active pulmonary nodules representing worsening metastases. T3 vertebral body and bilateral pedicle metastases status  post posture metallic fusion. No residual uptake in the thyroidectomy bed  Thyroglobulin level was 11.7 (thyroglobulin antibody present 9.8)   HISTORY OF PRESENTING ILLNESS:   Sarah [name] is a wonderful 52 y.o. female who has been referred to [place] by Dr Sarah [name] (medical oncology at [facility]) for help with local comanagement of the patient's metastatic Hurthle cell thyroid carcinoma with details as noted above. Patient had recent hospitalization from 4/13/7 to 07/10/2015 at [facility] for lower extremity weakness and was noted to have cord compression from recurrent tumor at T3. She is status post redo thoracic laminectomy of T3 and partial tumor resection to try to decompress the core. Pathology was consistent with metastatic Hurthle cell carcinoma.  She has been on dexamethasone 2 mg by mouth twice a day. Continue to have lower extremity weakness and is currently in a skilled nursing facility getting rehabilitation. She feels her lower extremity weakness is possibly getting somewhat better.  Patient had a repeat PET/CT scan at [facility] on 08/07/2015 as noted above which shows progression of her metastatic disease including nodal metastases , pulmonary nodules and right superior mediastinal mass.  MRI of the spine on 08/14/2015 showed improved but persistent cord compression at T3 level.  She was started by Sarah [name] on Lenvatinib and 14 mg by mouth daily for progressive metastatic disease after ruling out the option of repeat surgery with Dr. Cyndy [name] and reradiation with Dr. Ledon [name].  After a week or so the patient was increased to a dose of 10m po daily. She notes no diarrhea or other gastrointestinal symptoms. No skin rashes.  Is noted to have oral thrush and was started on nystatin swish and swallow.   Patient has a history of pulmonary  embolism 2015 and has continued to be on Xarelto for anticoagulation.   INTERVAL HISTORY  Sarah [name] returns today for a  follow up regarding her Metastatic Hurthle cell thyroid carcinoma. The patient's last visit with [place] in clinic was on 08/24/17. She is accompanied today by her mother and sister. The pt reports that she is doing well overall.   In the interim the pt was admitted for lower extremity weakness on 09/02/17, when a repeat MRI revealed recurrent spinal compression at T3. She had tumor resection from T3 with Neurosurgery Dr. Ashok [name]. The pt was admitted for a comprehensive rehab program on 09/08/17 and was then evaluated for her previous, non-resolving knee pain which was seen to be a left femoral shaft fracture with XR. The pt then had intermedullary retrograde femoral nailing on 09/11/17 with Dr. Marcelino [name]. The pt continued with her rehab program until she was discharged on 10/30/17.   The pt began radiation yesterday with my colleague Dr. Tammi [name], and is planning for five treatments.   She notes that she is not able to move her legs yet. She continues with PT and OT at her rehabilitation clinic. Sh notes that she breathes best when she is sitting up.   She is on 145mg Levothyroxine right now. She notes that she is eating well and is now moving her bowels well. She is receiving a sopository at night. She notes that she does not know when she needs to urinate or move her bowels and is wearing briefs at this time.   The pt notes that she is not consuming a supplement at this time and is eating fair, but not exceptionally well. She will discuss this with her nutritional therapist.   Of note since the patient's last visit, pt has had CT Thoracic Spine completed on 10/26/17 with results revealing Severe spinal stenosis at T3 from recurrent epidural tumor with complete effacement of the thecal sac and partial myelographic block. 2. Epidural tumor extends superiorly to T1 and inferiorly to T5 with milder spinal stenosis. 3. New nodular soft tissue dorsally in the canal at L1 and L2 which results in mild spinal stenosis  and presumably reflects tumor as well, though it is unclear if this is completely extradural or partially intradural as well. 4. Unchanged appearance of T1-T5 posterior fusion, severe T3 pathologic fracture, and T2-T4 osseous tumor compared to the recent noncontrast CT.  Lab results today (11/06/17) of CBC w/diff, CMP is as follows: all values are WNL except for WBC at 3.7k, RBC at 2.84, HGB at 9.1, HCT at 28.8, MCV at 101.4, RDW at 17.1, Calcium at 7.5, Total Protein at 5.4, Albumin at 1.9, Alk Phos at 236. 11/06/17 TSH and T4 are pending 11/06/17 Thyroglobulin antibody are pending  On review of systems, pt reports improving energy levels, eating fair, increasing activity levels, moving her bowels and denies fevers, chills, mouth sores, abdominal pain, and any other symptoms.    MEDICAL HISTORY:  Past Medical History:  Diagnosis Date  . Cancer ([facility]    FNA of thyroid positive for onconytic/hurthle cell carcinoma  . Chronic back pain   . DDD (degenerative disc disease), cervical   . DJD (degenerative joint disease)   . Family history of adverse reaction to anesthesia    MOTHER HAS NAUSEA   . History of rectal fissure   . [place] thrombocyt[place]   . Hypertension   . Obesity   . Paraplegia at T4 level ([place]   . Pulmonary embolus,  right ([place]) 2015  . Rotator cuff tendonitis right    SURGICAL HISTORY: Past Surgical History:  Procedure Laterality Date  . FEMUR IM NAIL Left 09/11/2017   Procedure: INTRAMEDULLARY (IM) RETROGRADE FEMORAL NAILING;  Surgeon: Altamese [place], MD;  Location: [place];  Service: Orthopedics;  Laterality: Left;  . LAMINECTOMY N/A 12/14/2013   Procedure: THORACIC LAMINECTOMY FOR TUMOR THORACIC THREE;  Surgeon: Sarah Pall, MD;  Location: [place] NEURO ORS;  Service: Neurosurgery;  Laterality: N/A;  THORACIC LAMINECTOMY FOR TUMOR THORACIC THREE  . LAMINECTOMY N/A 07/05/2015   Procedure: THORACIC LAMINECTOMY FOR TUMOR;  Surgeon: Sarah Pall, MD;   Location: [place] NEURO ORS;  Service: Neurosurgery;  Laterality: N/A;  . LAMINECTOMY N/A 09/03/2017   Procedure: POSTERIOR SPINAL TUMOR RESECTION THORACIC THREE;  Surgeon: Sarah Pall, MD;  Location: [place];  Service: Neurosurgery;  Laterality: N/A;  POSTERIOR SPINAL TUMOR RESECTION THORACIC THREE  . POSTERIOR LUMBAR FUSION 4 LEVEL N/A 12/30/2013   Procedure: Thoracic one-Thoracic five posterior thoracic fusion with pedicle screws;  Surgeon: Sarah Pall, MD;  Location: [place] NEURO ORS;  Service: Neurosurgery;  Laterality: N/A;  Thoracic one-Thoracic five posterior thoracic fusion with pedicle screws  . THYROIDECTOMY N/A 01/09/2014   Procedure: TOTAL THYROIDECTOMY;  Surgeon: Izora Gala, MD;  Location: [place];  Service: ENT;  Laterality: N/A;  . TONSILLECTOMY      SOCIAL HISTORY: Social History   Socioeconomic History  . Marital status: Single    Spouse name: Not on file  . Number of children: Not on file  . Years of education: Not on file  . Highest education level: Not on file  Occupational History  . Not on file  Social Needs  . Financial resource strain: Not on file  . Food insecurity:    Worry: Not on file    Inability: Not on file  . Transportation needs:    Medical: Not on file    Non-medical: Not on file  Tobacco Use  . Smoking status: Never Smoker  . Smokeless tobacco: Never Used  Substance and Sexual Activity  . Alcohol use: No  . Drug use: No  . Sexual activity: Not Currently  Lifestyle  . Physical activity:    Days per week: Not on file    Minutes per session: Not on file  . Stress: Not on file  Relationships  . Social connections:    Talks on phone: Not on file    Gets together: Not on file    Attends religious service: Not on file    Active member of club or organization: Not on file    Attends meetings of clubs or organizations: Not on file    Relationship status: Not on file  . Intimate partner violence:    Fear of current or ex partner: Not on file     Emotionally abused: Not on file    Physically abused: Not on file    Forced sexual activity: Not on file  Other Topics Concern  . Not on file  Social History Narrative  . Not on file    FAMILY HISTORY: Family History  Problem Relation Age of Onset  . Hypertension Mother   . Diabetes Mother   . Hypertension Father   . Stroke Father   . Diabetes Father   . Diabetes Sister   . Diabetes Sister     ALLERGIES:  is allergic to bee venom; keflex [cephalexin]; tramadol; gabapentin; heparin; hydrochlorothiazide; hydrocodone; oxycodone-acetaminophen; sorbitol; and tizanidine.  MEDICATIONS:  Current Outpatient Medications  Medication Sig Dispense Refill  . acetaminophen (TYLENOL) 325 MG tablet Take 2 tablets (650 mg total) by mouth every 4 (four) hours as needed for mild pain ((score 1 to 3) or temp > 100.5).    Sarah [name] Kitchen amoxicillin (AMOXIL) 500 MG capsule Take 1 capsule (500 mg total) by mouth every 8 (eight) hours.    . baclofen (LIORESAL) 20 MG tablet Take 1 tablet (20 mg total) by mouth 3 (three) times daily. 30 each 0  . bisacodyl (DULCOLAX) 10 MG suppository Place 1 suppository (10 mg total) rectally at bedtime. 12 suppository 0  . famotidine (PEPCID) 20 MG tablet Take 1 tablet (20 mg total) by mouth 2 (two) times daily. 60 tablet 0  . fluticasone (FLONASE) 50 MCG/ACT nasal spray Place 2 sprays into both nostrils daily. 16 g 6  . levothyroxine (SYNTHROID, LEVOTHROID) 137 MCG tablet Take 137 mcg by mouth daily before breakfast.    . loratadine (CLARITIN) 10 MG tablet Take 1 tablet (10 mg total) by mouth daily.    . ondansetron (ZOFRAN) 4 MG tablet Take 1 tablet by mouth every 6 (six) hours as needed.    . pantoprazole (PROTONIX) 40 MG tablet Take 1 tablet by mouth daily.    . polyethylene glycol (MIRALAX / GLYCOLAX) packet Take 17 g by mouth daily. 14 each 0  . rivaroxaban (XARELTO) 20 MG TABS tablet Take 20 mg by mouth daily with supper.    . senna (SENOKOT) 8.6 MG TABS tablet Take 1 tablet  (8.6 mg total) by mouth 2 (two) times daily. 120 each 0  . sertraline (ZOLOFT) 25 MG tablet Take 1 tablet (25 mg total) by mouth daily. 6 tablet 0  . sodium phosphate (FLEET) 7-19 GM/118ML ENEM Place 133 mLs (1 enema total) rectally daily as needed for severe constipation.  0  . sucralfate (CARAFATE) 1 GM/10ML suspension Take 10 mLs (1 g total) by mouth 4 (four) times daily -  with meals and at bedtime. (Patient not taking: Reported on 11/06/2017) 420 mL 0   No current facility-administered medications for this visit.     REVIEW OF SYSTEMS:    A 10+ POINT REVIEW OF SYSTEMS WAS OBTAINED including neurology, dermatology, psychiatry, cardiac, respiratory, lymph, extremities, GI, GU, Musculoskeletal, constitutional, breasts, reproductive, HEENT.  All pertinent positives are noted in the HPI.  All others are negative.   PHYSICAL EXAMINATION:  ECOG PERFORMANCE STATUS: 3 - Symptomatic, >50% confined to bed  Vitals:   11/06/17 1020  BP: 125/86  Pulse: 79  Resp: 18  Temp: 98.1 F (36.7 C)  SpO2: 100%   Filed Weights   11/06/17 1020  Weight: 236 lb (107 kg)   .Body mass index is 32.92 kg/m.   GENERAL:alert, in no acute distress and comfortable SKIN: no acute rashes, no significant lesions EYES: conjunctiva are pink and non-injected, sclera anicteric OROPHARYNX: MMM, no exudates, no oropharyngeal erythema or ulceration NECK: supple, no JVD LYMPH:  no palpable lymphadenopathy in the cervical, axillary or inguinal regions LUNGS: clear to auscultation b/l with normal respiratory effort HEART: regular rate & rhythm ABDOMEN:  normoactive bowel sounds , non tender, not distended. No palpable hepatosplenomegaly.  Extremity: 2+ pedal edema PSYCH: alert & oriented x 3 with fluent speech NEURO: no focal motor/sensory deficits   LABORATORY DATA:  I have reviewed the data as listed  . CBC Latest Ref Rng & Units 11/06/2017 10/26/2017 10/23/2017  WBC 3.9 - 10.3 K/uL 3.7(L) 3.2(L) 3.5(L)    Hemoglobin 11.6 - 15.9 g/dL 9.1(L)  8.0(L) 7.7(L)  Hematocrit 34.8 - 46.6 % 28.8(L) 26.5(L) 25.6(L)  Platelets 145 - 400 K/uL 171 156 168   ANC 2.3k . CMP Latest Ref Rng & Units 11/06/2017 10/26/2017 10/23/2017  Glucose 70 - 99 mg/dL 93 90 92  BUN 6 - 20 mg/dL _0 Creatinine 0.44 - 1.00 mg/dL 0.88 0.88 1.10(H)  Sodium 135 - 145 mmol/L 141 143 138  Potassium 3.5 - 5.1 mmol/L 3.8 3.8 4.2  Chloride 98 - 111 mmol/L 108 111 107  CO2 22 - 32 mmol/L _1 Calcium 8.9 - 10.3 mg/dL 7.5(L) 7.6(L) 7.5(L)  Total Protein 6.5 - 8.1 g/dL 5.4(L) 4.6(L) -  Total Bilirubin 0.3 - 1.2 mg/dL 0.5 1.1 -  Alkaline Phos 38 - 126 U/L 236(H) 233(H) -  AST 15 - 41 U/L 15 18 -  ALT 0 - 44 U/L 6 8 -         RADIOGRAPHIC STUDIES: I have personally reviewed the radiological images as listed and agreed with the findings in the report.  .Dg Abd 1 View  Result Date: 10/29/2017 CLINICAL DATA:  Increased nausea and vomiting. History of paraplegia, obesity. EXAM: ABDOMEN - 1 VIEW COMPARISON:  None. FINDINGS: No dilated bowel loops appreciated. No convincing evidence of free intraperitoneal air, soft tissue mass or abnormal fluid collection. No evidence of renal or ureteral calculi. No acute or suspicious osseous finding. IMPRESSION: No acute findings.  Grossly nonobstructive bowel gas pattern. Electronically Signed   By: Franki Cabot M.D.   On: 10/29/2017 14:28   Ct Thoracic Spine W Contrast  Result Date: 10/26/2017 CLINICAL DATA:  Metastatic Hurthle cell thyroid cancer. FLUOROSCOPY TIME:  Radiation Exposure Index (as provided by the fluoroscopic device): 692.07 microGray*m^2 Fluoroscopy Time (in minutes and seconds):  1 minute 24 seconds PROCEDURE: LUMBAR PUNCTURE FOR THORACIC MYELOGRAM After thorough discussion of risks and benefits of the procedure including bleeding, infection, injury to nerves, blood vessels, adjacent structures as well as headache and CSF leak, written and oral informed consent was obtained.  Consent was obtained by Dr. Logan Bores. Patient was positioned prone on the fluoroscopy table. Local anesthesia was provided with 1% lidocaine without epinephrine after prepped and draped in the usual sterile fashion. Puncture was performed at L2-3 using a 5 inch 22-gauge spinal needle via a right paramedian approach. Using a single pass through the dura, the needle was placed within the thecal sac, with return of clear CSF. 10 mL of Isovue M-300 was injected into the thecal sac, with normal opacification of the nerve roots and cauda equina consistent with free flow within the subarachnoid space. The patient was then moved to the trendelenburg position and contrast flowed into the Thoracic spine region. I personally performed the lumbar puncture and administered the intrathecal contrast. I also personally supervised acquisition of the myelogram images. TECHNIQUE: Contiguous axial images were obtained through the Thoracic spine after the intrathecal infusion of infusion. Coronal and sagittal reconstructions were obtained of the axial image sets. COMPARISON:  Noncontrast thoracic spine CT 10/23/2017. Thoracic and lumbar spine MRI 09/02/2017. FINDINGS: THORACIC MYELOGRAM FINDINGS: The spinal canal is widely patent in the lower thoracic spine. A ventral extradural defect at T7-8 results in moderate spinal stenosis. Sequelae of T1-T5 posterior fusion are again identified. Contrast was not seen to extend superior to the T4 level despite significant Trendelenburg positioning with the patient prone and in a left lateral decubitus position. CT THORACIC MYELOGRAM FINDINGS: Mildly exaggerated kyphosis is again noted in the upper  thoracic spine. There is mild right convex curvature of the thoracic spine without significant listhesis. Sequelae of T1-T5 posterior fusion are again identified with pedicle screws remaining in place at T1, T2, T4, and T5. A chronic pathologic T3 fracture is noted with unchanged vertebra plana  configuration. Lytic lesions in the T2 and T4 vertebral bodies are unchanged from the recent prior CT with involvement of bone about both T4 screws. No new fracture is identified. Bridging anterior and lateral vertebral osteophytes are present from T2-L2. Intrathecal contrast is present above the level of the thoracic fusion despite not being visible on conventional myelographic images, although contrast in the lower cervical spine is very dilute. There is extensive soft tissue throughout the spinal canal from T2-T4 at the site of prior posterior decompression which results in complete effacement of the thecal sac at the T3 and upper T4 levels and presumably reflects recurrent epidural tumor. The thecal sac is narrowed in the transverse dimension at the T2 level. Nodular epidural soft tissue extends superiorly on the left as far as the T1 superior endplate level with mild spinal stenosis at T1. There is also nodular epidural soft tissue extending inferiorly in the ventral aspect of the canal to the mid T5 level. Nodular dorsal epidural soft tissue is also present at T5 contributing to mild spinal stenosis. Dorsal epidural lipomatosis is noted in the midthoracic spine with mildly nodular appearance of the fat at T6. There is nodular soft tissue dorsally in the canal at L1 and L2, the largest focus measuring 7 mm in the midline at L1, which results in mild spinal stenosis and is new from the prior MRI. It is not clear whether this material is extradural, intradural, or possibly a combination of both. The visible proximal cauda equina nerve roots are not grossly thickened. A partially calcified central disc extrusion at T7-8 is unchanged from the prior MRI and results in moderate spinal stenosis with ventral cord flattening. A smaller calcified left paracentral disc protrusion at T8-9 is also unchanged and results in mild left-sided cord flattening without significant spinal stenosis. There is severe right facet  arthrosis at T12-L1 roots results in mild right neural foraminal stenosis, unchanged. Coronary artery atherosclerosis, trace right and small left pleural effusions, and small volume assault fungemia fluid are noted. IMPRESSION: 1. 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 2. Epidural tumor extends superiorly to T1 and inferiorly to T5 with milder spinal stenosis. 3. New nodular soft tissue dorsally in the canal at L1 and L2 which results in mild spinal stenosis and presumably reflects tumor as well, though it is unclear if this is completely extradural or partially intradural as well. 4. Unchanged appearance of T1-T5 posterior fusion, severe T3 pathologic fracture, and T2-T4 osseous tumor compared to the recent noncontrast CT. Electronically Signed   By: Logan Bores M.D.   On: 10/26/2017 16:46   Ct Thoracic Spine W Contrast  Result Date: 10/23/2017 CLINICAL DATA:  Bone neoplasm of the spine. Malignant Hurthle cell thyroid cancer. EXAM: CT THORACIC SPINE WITH CONTRAST TECHNIQUE: Multidetector CT images of thoracic was performed according to the standard protocol following intravenous contrast administration. CONTRAST:  182m OMNIPAQUE IOHEXOL 300 MG/ML  SOLN COMPARISON:  CT of the thoracic spine 09/09/2017. MRI of the thoracic spine 09/02/2017. FINDINGS: Alignment: Alignment is stable. AP alignment is anatomic below the fused segments. Vertebrae: Vertebral plana at pathologic compression fracture at T3 is again seen. Lytic  lesions at T2 and T4 are similar the prior study. Lesions involve the course the pedicle screws bilaterally at T4. No new metastatic lesions are present. Paraspinal and other soft tissues: Diffuse posterior paraspinal soft tissue density is stable. The ventral and lateral paraspinous soft tissues are within normal limits. A left pleural effusion is present. Ground-glass attenuation is present in the left  lung. Heart is enlarged. No significant pericardial effusion is present. Limited imaging of the abdomen is unremarkable. Disc levels: Central calcification posterior to T7 impacts the ventral surface of the cord, unchanged. There is high-density material in the spinal canal at the T3 level. This may represent extraosseous tumor. Spinal canal appears to be patent otherwise. IMPRESSION: 1. Similar appearance of pathologic vertebral plana compression fracture at T3 with known metastatic disease. 2. Lytic lesions at T2 and T4 are stable. 3. Question soft tissue within the spinal canal at T3. This area is not well assessed by noncontrast CT and there is significant artifact through this region on MRI. The area could be evaluated with thoracic myelogram if clinically indicated. 4. Mild moderate ventral central canal stenosis at T7 secondary to a ventral calcification. Electronically Signed   By: San Morelle M.D.   On: 10/23/2017 08:51   Dg Myelography Lumbar Inj Thoracic  Result Date: 10/26/2017 CLINICAL DATA:  Metastatic Hurthle cell thyroid cancer. FLUOROSCOPY TIME:  Radiation Exposure Index (as provided by the fluoroscopic device): 692.07 microGray*m^2 Fluoroscopy Time (in minutes and seconds):  1 minute 24 seconds PROCEDURE: LUMBAR PUNCTURE FOR THORACIC MYELOGRAM After thorough discussion of risks and benefits of the procedure including bleeding, infection, injury to nerves, blood vessels, adjacent structures as well as headache and CSF leak, written and oral informed consent was obtained. Consent was obtained by Dr. Logan Bores. Patient was positioned prone on the fluoroscopy table. Local anesthesia was provided with 1% lidocaine without epinephrine after prepped and draped in the usual sterile fashion. Puncture was performed at L2-3 using a 5 inch 22-gauge spinal needle via a right paramedian approach. Using a single pass through the dura, the needle was placed within the thecal sac, with return of clear  CSF. 10 mL of Isovue M-300 was injected into the thecal sac, with normal opacification of the nerve roots and cauda equina consistent with free flow within the subarachnoid space. The patient was then moved to the trendelenburg position and contrast flowed into the Thoracic spine region. I personally performed the lumbar puncture and administered the intrathecal contrast. I also personally supervised acquisition of the myelogram images. TECHNIQUE: Contiguous axial images were obtained through the Thoracic spine after the intrathecal infusion of infusion. Coronal and sagittal reconstructions were obtained of the axial image sets. COMPARISON:  Noncontrast thoracic spine CT 10/23/2017. Thoracic and lumbar spine MRI 09/02/2017. FINDINGS: THORACIC MYELOGRAM FINDINGS: The spinal canal is widely patent in the lower thoracic spine. A ventral extradural defect at T7-8 results in moderate spinal stenosis. Sequelae of T1-T5 posterior fusion are again identified. Contrast was not seen to extend superior to the T4 level despite significant Trendelenburg positioning with the patient prone and in a left lateral decubitus position. CT THORACIC MYELOGRAM FINDINGS: Mildly exaggerated kyphosis is again noted in the upper thoracic spine. There is mild right convex curvature of the thoracic spine without significant listhesis. Sequelae of T1-T5 posterior fusion are again identified with pedicle screws remaining in place at T1, T2, T4, and T5. A chronic pathologic T3 fracture is noted with unchanged vertebra plana configuration. Lytic lesions in the T2 and  T4 vertebral bodies are unchanged from the recent prior CT with involvement of bone about both T4 screws. No new fracture is identified. Bridging anterior and lateral vertebral osteophytes are present from T2-L2. Intrathecal contrast is present above the level of the thoracic fusion despite not being visible on conventional myelographic images, although contrast in the lower cervical  spine is very dilute. There is extensive soft tissue throughout the spinal canal from T2-T4 at the site of prior posterior decompression which results in complete effacement of the thecal sac at the T3 and upper T4 levels and presumably reflects recurrent epidural tumor. The thecal sac is narrowed in the transverse dimension at the T2 level. Nodular epidural soft tissue extends superiorly on the left as far as the T1 superior endplate level with mild spinal stenosis at T1. There is also nodular epidural soft tissue extending inferiorly in the ventral aspect of the canal to the mid T5 level. Nodular dorsal epidural soft tissue is also present at T5 contributing to mild spinal stenosis. Dorsal epidural lipomatosis is noted in the midthoracic spine with mildly nodular appearance of the fat at T6. There is nodular soft tissue dorsally in the canal at L1 and L2, the largest focus measuring 7 mm in the midline at L1, which results in mild spinal stenosis and is new from the prior MRI. It is not clear whether this material is extradural, intradural, or possibly a combination of both. The visible proximal cauda equina nerve roots are not grossly thickened. A partially calcified central disc extrusion at T7-8 is unchanged from the prior MRI and results in moderate spinal stenosis with ventral cord flattening. A smaller calcified left paracentral disc protrusion at T8-9 is also unchanged and results in mild left-sided cord flattening without significant spinal stenosis. There is severe right facet arthrosis at T12-L1 roots results in mild right neural foraminal stenosis, unchanged. Coronary artery atherosclerosis, trace right and small left pleural effusions, and small volume assault fungemia fluid are noted. IMPRESSION: 1. 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 2. Epidural tumor extends superiorly to T1 and inferiorly to T5 with  milder spinal stenosis. 3. New nodular soft tissue dorsally in the canal at L1 and L2 which results in mild spinal stenosis and presumably reflects tumor as well, though it is unclear if this is completely extradural or partially intradural as well. 4. Unchanged appearance of T1-T5 posterior fusion, severe T3 pathologic fracture, and T2-T4 osseous tumor compared to the recent noncontrast CT. Electronically Signed   By: Logan Bores M.D.   On: 10/26/2017 16:46   Dg Femur Port Min 2 Views Left  Result Date: 10/13/2017 CLINICAL DATA:  52 year old female with distal left femur fracture treated with intramedullary rod 09/11/2017. Subsequent encounter. EXAM: LEFT FEMUR PORTABLE 2 VIEWS COMPARISON:  09/11/2017. FINDINGS: Post placement of left femoral intramedullary rod with proximal and distal fixation screws for treatment of oblique fracture of the distal left femoral diametaphysis. Overall similar appearance to prior exam without evidence of radiographic healing. Areas of sclerosis and osteopenia unchanged. IMPRESSION: Similar appearance of distal left femur fracture post internal fixation. Electronically Signed   By: Genia Del M.D.   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  Patient has had treatment as noted above. h/o  cord compression at T3 level due to recurrent tumor status post partial resection and debulking.   MRI on 08/14/2015 showed improved but persistent cord compression at the T3 level. She has been noted not to be a candidate for further repeat surgery at this time or repeat radiation. Recent PET/CT scan is noted of bowel done at [facility] on 08/07/2015 showed evidence of tumor progression and patient was started on lenvatinib as per Sarah [name]. Her thyroglobulin level was 11.7 with anti-body of 9.8 on 08/07/2015 at  [place]  PET/CT 11/01/2015: shows relatively stable though high burden of disease. Some concern of local recurrence in the thyroidectomy bed which is asymptomatic at this time. MRI thoracic spine 12/25/2015: No new abnormality since the prior examination. Status post T1-5 fusion for T3 decompression. Mass effect on the cord at the T3 level appears improved compared to the prior exam. Postoperative fluid collection likely representing a seroma seen on the prior study has slightly decreased in size.  PET/CT 06/03/2016 with Generally reduced hypermetabolic activity in the previously identified lesions, including the pulmonary nodules, dominant upper thoracic metastatic focus, and left lower cervical adenopathy. Mildly reduced size of previous lesions as well. No new lesions.  PET/CT 12/12/2016: Concern for progression of metastatic Hurthle cell thyroid carcinoma with significant increase in metabolic activity of several lymph nodes, pulmonary nodules, and skeletal metastasis. Most convincing element of progression is a significant increase in metabolic activity (more than doubling). Mild increase in size of the lymph nodes.  PET/CT  4/96/75: Partial metabolic response. Left supraclavicular and right paratracheal nodal metastases, bilateral pulmonary metastases, and osseous metastases involving the upper thoracic spine and right sternum, with mild decrease in metabolism, as described above.   Plan: --Discussed pt labwork today, 11/06/17; HGB improved to 9.1, normal PLT, WBC improved to 3.7k. 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 2. Epidural tumor extends superiorly to T1 and inferiorly to T5 with milder spinal stenosis. 3. New nodular soft tissue dorsally in the canal at L1 and L2 which results in mild spinal  stenosis and presumably reflects tumor as well, though it is unclear if this is completely extradural or partially intradural as well. 4. Unchanged appearance of T1-T5 posterior fusion, severe T3 pathologic fracture, and T2-T4 osseous tumor compared to the recent noncontrast CT. -Recommend that the pt increase her protein consumption and optimize her nutrition with the nutrition therapist -Continue follow up with Dr. Tammi [name] for RT -Will reassess with pt in two months   2) Grade 1 oral mucositis related to Lenvatinib -resolved  3) FDG avidity in the rectum - much improved. No significant concerns on last PET  4) abnormal LFTs likely from gallstones  5) Mild Leukopenia without neutropenia resolved.  Plan  -MRI T spine given LE weakness -on levothyroxine per endocrinologist. Recommended she discussed this with Dr Chalmers Cater to determine need for levothyroxine dose adjustment for TSH suppression. Thyroglobulin levels stable and Ab levels -pending. -holding Lenvatinib at this time  6) history of right-sided pulmonary embolism on chronic anticoagulation with Rivaroxaban. Monitor for bleeding. -continue Xarelto  RTC with Dr Irene Limbo in 2 months with labs    All of the patients questions were answered with apparent satisfaction. The patient knows to call the clinic with any problems, questions or concerns.  The total time spent in the appt was 30 minutes and more  than 50% was on counseling and direct patient cares.     Sullivan Lone MD MS AAHIVMS [facility] Hematology/Oncology Physician [facility]  (Office):       [phone] (Work cell):  [phone] (Fax):           [phone]  I, Baldwin Jamaica, am acting as a scribe for [name]  .I have reviewed the above documentation for accuracy and completeness, and I agree with the above. Sarah Genera MD

## 2017-11-05 NOTE — Progress Notes (Signed)
  Radiation Oncology         [phone] ________________________________  Spinal Stereotactic Radiosurgery Procedure Note  Name: Sarah [name] MRN: [id number]  Date: 11/05/2017  DOB: 12-04-1965  SPECIAL TREATMENT PROCEDURE    ICD-10-CM   1. Metastatic cancer to spine ([place]) C79.51   2. Hurthle cell carcinoma of thyroid ([place]) C73     3D TREATMENT PLANNING AND DOSIMETRY:  The patient's radiation plan was reviewed and approved by neurosurgery and radiation oncology prior to treatment.  It showed 3-dimensional radiation distributions overlaid onto the planning CT/MRI image set.  The [facility] for the target structures as well as the organs at risk were reviewed. The documentation of the 3D plan and dosimetry are filed in the radiation oncology EMR.  NARRATIVE:  Sarah [name] was brought to the TrueBeam stereotactic radiation treatment machine and placed supine on the CT couch. The patient was precisely re-positioned in their BodyFix immobilization device, and the patient was set up for stereotactic radiosurgery.  Neurosurgery was present for the set-up and delivery  SIMULATION VERIFICATION:  In the couch zero-angle position, the patient underwent Exactrac imaging using the Brainlab system with orthogonal KV images to position the target accounting for translation and rotational factors.  These were carefully aligned and repeated to confirm treatment position.  Then, cone beam CT was performed to help further verify placement and make any final translational shifts.  SPECIAL TREATMENT PROCEDURE: Sarah [name] received stereotactic radiosurgery to the following targets:  The targeted metastasis in the T3 vertebral body was treated using 4 Rapid Arc VMAT Beams to a prescription fractional dose of 10 Gy to be repeated 5 times to full dose 50 Gy to the gross disease (GTV)    The beams were delivered with 6 MV X-rays in the flattening filter free mode.  STEREOTACTIC TREATMENT MANAGEMENT:  Following  delivery, the patient was transported to nursing in stable condition and monitored for possible acute effects.  Vital signs were recorded BP (!) 128/91 (BP Location: Left Arm)   Pulse 79   Temp 97.7 F (36.5 C) (Oral)   Resp 20   LMP 10/22/2016 (LMP Unknown)   SpO2 100% . The patient tolerated treatment without significant acute effects, and was discharged to home in stable condition.    PLAN: Follow-up in one month.  ________________________________  [name]. [name], M.D.  This document serves as a record of services personally performed by Tyler Pita MD. It was created on his behalf by Delton Coombes, a trained medical scribe. The creation of this record is based on the scribe's personal observations and the provider's statements to them.

## 2017-11-05 NOTE — Progress Notes (Signed)
Nurse monitoring following SRS treatment complete. Vitals stable. Patient in no distress. Patient denies any new neurologic symptoms. Sent patient back to SNF with orders to avoid strenuous activity, contact information for oncall rad onc provider, and request to leave hoyer sling under patient for next treatment. Patient wheeled to lobby accompanied by family for pick up.   BP (!) 128/91 (BP Location: Left Arm)   Pulse 79   Temp 97.7 F (36.5 C) (Oral)   Resp 20   LMP 10/22/2016 (LMP Unknown)   SpO2 100%  .kh

## 2017-11-06 ENCOUNTER — Inpatient Hospital Stay: Payer: Medicare Other | Attending: Hematology

## 2017-11-06 ENCOUNTER — Telehealth: Payer: Self-pay | Admitting: Hematology

## 2017-11-06 ENCOUNTER — Other Ambulatory Visit: Payer: Self-pay | Admitting: Hematology

## 2017-11-06 ENCOUNTER — Inpatient Hospital Stay (HOSPITAL_BASED_OUTPATIENT_CLINIC_OR_DEPARTMENT_OTHER): Payer: Medicare Other | Admitting: Hematology

## 2017-11-06 ENCOUNTER — Encounter: Payer: Self-pay | Admitting: Hematology

## 2017-11-06 VITALS — BP 125/86 | HR 79 | Temp 98.1°F | Resp 18 | Ht 71.0 in | Wt 236.0 lb

## 2017-11-06 DIAGNOSIS — R531 Weakness: Secondary | ICD-10-CM

## 2017-11-06 DIAGNOSIS — C73 Malignant neoplasm of thyroid gland: Secondary | ICD-10-CM | POA: Diagnosis not present

## 2017-11-06 DIAGNOSIS — Z7901 Long term (current) use of anticoagulants: Secondary | ICD-10-CM

## 2017-11-06 DIAGNOSIS — C778 Secondary and unspecified malignant neoplasm of lymph nodes of multiple regions: Secondary | ICD-10-CM | POA: Diagnosis not present

## 2017-11-06 DIAGNOSIS — K6289 Other specified diseases of anus and rectum: Secondary | ICD-10-CM

## 2017-11-06 DIAGNOSIS — R945 Abnormal results of liver function studies: Secondary | ICD-10-CM

## 2017-11-06 DIAGNOSIS — C7802 Secondary malignant neoplasm of left lung: Secondary | ICD-10-CM | POA: Diagnosis not present

## 2017-11-06 DIAGNOSIS — Z79899 Other long term (current) drug therapy: Secondary | ICD-10-CM | POA: Insufficient documentation

## 2017-11-06 DIAGNOSIS — C7951 Secondary malignant neoplasm of bone: Secondary | ICD-10-CM

## 2017-11-06 DIAGNOSIS — Z86711 Personal history of pulmonary embolism: Secondary | ICD-10-CM | POA: Insufficient documentation

## 2017-11-06 DIAGNOSIS — C7801 Secondary malignant neoplasm of right lung: Secondary | ICD-10-CM | POA: Insufficient documentation

## 2017-11-06 LAB — CMP (CANCER CENTER ONLY)
ALBUMIN: 1.9 g/dL — AB (ref 3.5–5.0)
ALT: 6 U/L (ref 0–44)
AST: 15 U/L (ref 15–41)
Alkaline Phosphatase: 236 U/L — ABNORMAL HIGH (ref 38–126)
Anion gap: 5 (ref 5–15)
BUN: 15 mg/dL (ref 6–20)
CHLORIDE: 108 mmol/L (ref 98–111)
CO2: 28 mmol/L (ref 22–32)
Calcium: 7.5 mg/dL — ABNORMAL LOW (ref 8.9–10.3)
Creatinine: 0.88 mg/dL (ref 0.44–1.00)
GFR, Est AFR Am: >60 mL/min (ref >60)
GFR, Estimated: >60 mL/min (ref >60)
GLUCOSE: 93 mg/dL (ref 70–99)
Potassium: 3.8 mmol/L (ref 3.5–5.1)
Sodium: 141 mmol/L (ref 135–145)
Total Bilirubin: 0.5 mg/dL (ref 0.3–1.2)
Total Protein: 5.4 g/dL — ABNORMAL LOW (ref 6.5–8.1)

## 2017-11-06 LAB — CBC WITH DIFFERENTIAL/PLATELET
BASOS ABS: 0 10*3/uL (ref 0.0–0.1)
BASOS PCT: 0 %
EOS ABS: 0.2 10*3/uL (ref 0.0–0.5)
Eosinophils Relative: 6 %
HEMATOCRIT: 28.8 % — AB (ref 34.8–46.6)
HEMOGLOBIN: 9.1 g/dL — AB (ref 11.6–15.9)
Lymphocytes Relative: 26 %
Lymphs Abs: 1 10*3/uL (ref 0.9–3.3)
MCH: 32 pg (ref 25.1–34.0)
MCHC: 31.6 g/dL (ref 31.5–36.0)
MCV: 101.4 fL — ABNORMAL HIGH (ref 79.5–101.0)
MONOS PCT: 6 %
Monocytes Absolute: 0.2 10*3/uL (ref 0.1–0.9)
NEUTROS ABS: 2.3 10*3/uL (ref 1.5–6.5)
NEUTROS PCT: 62 %
Platelets: 171 10*3/uL (ref 145–400)
RBC: 2.84 MIL/uL — AB (ref 3.70–5.45)
RDW: 17.1 % — ABNORMAL HIGH (ref 11.2–14.5)
WBC: 3.7 10*3/uL — AB (ref 3.9–10.3)

## 2017-11-06 LAB — T4, FREE: FREE T4: 1.81 ng/dL — AB (ref 0.82–1.77)

## 2017-11-06 LAB — TSH: TSH: 0.266 u[IU]/mL — AB (ref 0.308–3.960)

## 2017-11-06 NOTE — Progress Notes (Unsigned)
cb

## 2017-11-06 NOTE — Telephone Encounter (Signed)
Gave patient avs and calendar of upcoming appts.  °

## 2017-11-07 LAB — THYROGLOBULIN ANTIBODY

## 2017-11-08 ENCOUNTER — Ambulatory Visit: Admission: RE | Admit: 2017-11-08 | Payer: Medicare Other | Source: Ambulatory Visit | Admitting: Radiation Oncology

## 2017-11-09 ENCOUNTER — Ambulatory Visit
Admission: RE | Admit: 2017-11-09 | Discharge: 2017-11-09 | Disposition: A | Discharge status: Home / Self Care | Payer: Medicare Other | Source: Ambulatory Visit | Attending: Radiation Oncology | Admitting: Radiation Oncology

## 2017-11-09 DIAGNOSIS — C7951 Secondary malignant neoplasm of bone: Secondary | ICD-10-CM | POA: Diagnosis not present

## 2017-11-09 DIAGNOSIS — Z510 Encounter for antineoplastic radiation therapy: Secondary | ICD-10-CM | POA: Diagnosis not present

## 2017-11-09 DIAGNOSIS — C73 Malignant neoplasm of thyroid gland: Secondary | ICD-10-CM | POA: Diagnosis not present

## 2017-11-09 NOTE — Progress Notes (Signed)
Nurse monitoring following SRS treatment complete. Vitals stable. Patient in no distress. Patient denies any new neurologic symptoms. Sent patient back to SNF with orders to avoid strenuous activity, contact information for oncall rad onc provider should needs arise, and request to leave hoyer sling under patient for next treatment. Patient wheeled to lobby accompanied by family for pick up by facility.   BP 139/80   Pulse 76   Temp 97.6 F (36.4 C)   Resp 20   LMP 10/22/2016 (LMP Unknown)   SpO2 100%

## 2017-11-10 ENCOUNTER — Ambulatory Visit (HOSPITAL_COMMUNITY): Admission: RE | Admit: 2017-11-10 | Payer: Medicare Other | Source: Ambulatory Visit

## 2017-11-11 ENCOUNTER — Other Ambulatory Visit: Payer: Self-pay | Admitting: Radiation Therapy

## 2017-11-11 ENCOUNTER — Ambulatory Visit
Admission: RE | Admit: 2017-11-11 | Discharge: 2017-11-11 | Disposition: A | Discharge status: Home / Self Care | Payer: Medicare Other | Source: Ambulatory Visit | Attending: Radiation Oncology | Admitting: Radiation Oncology

## 2017-11-11 VITALS — BP 127/75 | HR 78 | Temp 97.7°F | Resp 20

## 2017-11-11 DIAGNOSIS — Z510 Encounter for antineoplastic radiation therapy: Secondary | ICD-10-CM | POA: Diagnosis not present

## 2017-11-11 DIAGNOSIS — C73 Malignant neoplasm of thyroid gland: Secondary | ICD-10-CM | POA: Diagnosis not present

## 2017-11-11 DIAGNOSIS — G8100 Flaccid hemiplegia affecting unspecified side: Secondary | ICD-10-CM | POA: Diagnosis not present

## 2017-11-11 DIAGNOSIS — C7951 Secondary malignant neoplasm of bone: Secondary | ICD-10-CM

## 2017-11-11 NOTE — Progress Notes (Signed)
  Radiation Oncology         (336) [phone] ________________________________  Spinal Stereotactic Radiosurgery Procedure Note  Name: Sarah [name] MRN: [id number]  Date: 11/11/2017  DOB: 1965-09-06  SPECIAL TREATMENT PROCEDURE    ICD-10-CM   1. Metastatic cancer to spine [facility]) C79.51     3D TREATMENT PLANNING AND DOSIMETRY:  The patient's radiation plan was reviewed and approved by neurosurgery and radiation oncology prior to treatment.  It showed 3-dimensional radiation distributions overlaid onto the planning CT/MRI image set.  The [facility] for the target structures as well as the organs at risk were reviewed. The documentation of the 3D plan and dosimetry are filed in the radiation oncology EMR.  NARRATIVE:  Sarah [name] was brought to the TrueBeam stereotactic radiation treatment machine and placed supine on the CT couch. The patient was precisely re-positioned in their BodyFix immobilization device, and the patient was set up for stereotactic radiosurgery.  Neurosurgery was present for the set-up and delivery of her third of five scheduled SRS treatments.  SIMULATION VERIFICATION:  In the couch zero-angle position, the patient underwent Exactrac imaging using the Brainlab system with orthogonal KV images to position the target accounting for translation and rotational factors.  These were carefully aligned and repeated to confirm treatment position.  Then, cone beam CT was performed to help further verify placement and make any final translational shifts.  SPECIAL TREATMENT PROCEDURE: Sarah [name] received stereotactic radiosurgery to the following targets:  The targeted metastasis in the T3 vertebral body was treated using 4 Rapid Arc VMAT Beams to a prescription fractional dose of 10 Gy to be repeated 5 times to full dose 50 Gy to the gross disease (GTV)    The beams were delivered with 6 MV X-rays in the flattening filter free mode.  STEREOTACTIC TREATMENT MANAGEMENT:  Following  delivery, the patient was transported to nursing in stable condition and monitored for possible acute effects.  Vital signs were recorded BP 127/75 (BP Location: Right Arm)   Pulse 78   Temp 97.7 F (36.5 C) (Oral)   Resp 20   LMP 10/22/2016 (LMP Unknown)   SpO2 100% . The patient tolerated treatment without significant acute effects, and was discharged to home in stable condition.    PLAN: She will return on 11/13/17 for her fourth fraction.  ________________________________  [name] [name], M.D.  This document serves as a record of services personally performed by Tyler Pita, MD. It was created on his behalf by Wilburn Mylar, a trained medical scribe. The creation of this record is based on the scribe's personal observations and the provider's statements to them. This document has been checked and approved by the attending provider.

## 2017-11-12 ENCOUNTER — Telehealth: Payer: Self-pay | Admitting: *Deleted

## 2017-11-12 NOTE — Telephone Encounter (Signed)
"  Thyroid labs were drawn there last Friday.   I see Dr.Ballen is unable to see results because [facility] is a Biomedical engineer.  Dr. Bubba Camp provided me with fax number [phone].  Please fax results.  Thank you." Denies further needs or questions at this time.

## 2017-11-13 ENCOUNTER — Ambulatory Visit
Admission: RE | Admit: 2017-11-13 | Discharge: 2017-11-13 | Disposition: A | Discharge status: Home / Self Care | Payer: Medicare Other | Source: Ambulatory Visit | Attending: Radiation Oncology | Admitting: Radiation Oncology

## 2017-11-13 ENCOUNTER — Other Ambulatory Visit: Payer: Self-pay | Admitting: Radiation Oncology

## 2017-11-13 DIAGNOSIS — C7951 Secondary malignant neoplasm of bone: Secondary | ICD-10-CM

## 2017-11-13 DIAGNOSIS — C73 Malignant neoplasm of thyroid gland: Secondary | ICD-10-CM | POA: Diagnosis not present

## 2017-11-13 DIAGNOSIS — Z510 Encounter for antineoplastic radiation therapy: Secondary | ICD-10-CM | POA: Diagnosis not present

## 2017-11-13 MED ORDER — SUCRALFATE 1 G PO TABS: 1.0000 g | ORAL_TABLET | Freq: Three times a day (TID) | ORAL | 2 refills | Status: DC

## 2017-11-13 NOTE — Progress Notes (Addendum)
  Radiation Oncology         [phone] ________________________________  Spinal Stereotactic Radiosurgery Procedure Note  Name: Sarah [name] MRN: [id number]  Date: 11/13/2017  DOB: 30-Oct-1965  SPECIAL TREATMENT PROCEDURE    ICD-10-CM   1. Metastatic cancer to spine [facility]) C79.51     3D TREATMENT PLANNING AND DOSIMETRY:  The patient's radiation plan was reviewed and approved by neurosurgery and radiation oncology prior to treatment.  It showed 3-dimensional radiation distributions overlaid onto the planning CT/MRI image set.  The [facility] for the target structures as well as the organs at risk were reviewed. The documentation of the 3D plan and dosimetry are filed in the radiation oncology EMR.  NARRATIVE:  Sarah [name] was brought to the TrueBeam stereotactic radiation treatment machine and placed supine on the CT couch. The patient was precisely re-positioned in their BodyFix immobilization device, and the patient was set up for stereotactic radiosurgery.  Neurosurgery was present for the set-up and delivery  SIMULATION VERIFICATION:  In the couch zero-angle position, the patient underwent Exactrac imaging using the Brainlab system with orthogonal KV images to position the target accounting for translation and rotational factors.  These were carefully aligned and repeated to confirm treatment position.  Then, cone beam CT was performed to help further verify placement and make any final translational shifts.  SPECIAL TREATMENT PROCEDURE: [name] received stereotactic radiosurgery to the following targets:  The targeted metastasis in the T3 vertebral body was treated using 4 Rapid Arc VMAT Beams to a prescription fractional dose of 10 Gy to be repeated 5 times to full dose 50 Gy to the gross disease (GTV). The beams were delivered with 10 MV X-rays in the flattening filter free mode.  STEREOTACTIC TREATMENT MANAGEMENT:  Following delivery, the patient was transported to nursing in  stable condition and monitored for possible acute effects.  Vital signs were recorded LMP 10/22/2016 (LMP Unknown) . The patient tolerated treatment without significant acute effects, and was discharged to home in stable condition.    PLAN: She will return on Monday, 11/16/17 for her final treatment.  ________________________________  [name]. [name], M.D.  This document serves as a record of services personally performed by Tyler Pita, MD. It was created on his behalf by Wilburn Mylar, a trained medical scribe. The creation of this record is based on the scribe's personal observations and the provider's statements to them. This document has been checked and approved by the attending provider.

## 2017-11-15 LAB — THYROGLOBULIN LEVEL

## 2017-11-16 ENCOUNTER — Ambulatory Visit: Payer: Medicare Other | Admitting: Radiation Oncology

## 2017-11-16 DIAGNOSIS — G8100 Flaccid hemiplegia affecting unspecified side: Secondary | ICD-10-CM | POA: Diagnosis not present

## 2017-11-16 DIAGNOSIS — C73 Malignant neoplasm of thyroid gland: Secondary | ICD-10-CM | POA: Diagnosis not present

## 2017-11-17 ENCOUNTER — Ambulatory Visit (HOSPITAL_COMMUNITY)
Admission: RE | Admit: 2017-11-17 | Discharge: 2017-11-17 | Disposition: A | Discharge status: Home / Self Care | Payer: Medicare Other | Source: Ambulatory Visit | Attending: Urology | Admitting: Urology

## 2017-11-17 DIAGNOSIS — C7951 Secondary malignant neoplasm of bone: Secondary | ICD-10-CM | POA: Insufficient documentation

## 2017-11-17 DIAGNOSIS — M1288 Other specific arthropathies, not elsewhere classified, other specified site: Secondary | ICD-10-CM | POA: Diagnosis not present

## 2017-11-17 MED ORDER — GADOBENATE DIMEGLUMINE 529 MG/ML IV SOLN
20.0000 mL | Freq: Once | INTRAVENOUS | Status: AC | PRN
  Administered 2017-11-17: 20 mL via INTRAVENOUS

## 2017-11-18 ENCOUNTER — Ambulatory Visit
Admission: RE | Admit: 2017-11-18 | Discharge: 2017-11-18 | Disposition: A | Discharge status: Home / Self Care | Payer: Medicare Other | Source: Ambulatory Visit | Attending: Radiation Oncology | Admitting: Radiation Oncology

## 2017-11-18 ENCOUNTER — Other Ambulatory Visit: Payer: Self-pay

## 2017-11-18 ENCOUNTER — Inpatient Hospital Stay: Payer: Medicare Other

## 2017-11-18 DIAGNOSIS — R1011 Right upper quadrant pain: Secondary | ICD-10-CM | POA: Diagnosis not present

## 2017-11-18 DIAGNOSIS — R1033 Periumbilical pain: Secondary | ICD-10-CM | POA: Diagnosis not present

## 2017-11-18 NOTE — Patient Outreach (Signed)
[facility]) Care Management  11/18/2017  ADRIANA QUINBY 02-16-1966 [id number]   Medication Adherence call to Mrs.  Betsabe Iglesia left a message for patient to call back patient is due on Lisinopril 10 mg. Mrs. Cardona is showing past due under [place].  [facility] Direct Dial [phone]  Fax [phone] [email or web address]

## 2017-11-19 ENCOUNTER — Ambulatory Visit: Payer: Medicare Other | Admitting: Radiation Oncology

## 2017-11-19 NOTE — Progress Notes (Signed)
Brain and Spine Tumor Board Documentation  Sarah [name] was presented by Cecil Cobbs, MD at Brain and Spine Tumor Board on 11/19/2017, which included representatives from neuro oncology, radiation oncology, surgical oncology, radiology, pathology, navigation.  Sarah [name] was presented as a current patient with history of the following treatments:  .  Additionally, we reviewed previous medical and familial history, history of present illness, and recent lab results along with all available histopathologic and imaging studies. The tumor board considered available treatment options and made the following recommendations:  Active surveillance  No visible leptomeningeal dissemination.  Suspect foci of enhancement at T12 is non-neoplastic in etiology, may represent sub-dural effusion.  Will follow.  Tumor board is a meeting of clinicians from various specialty areas who evaluate and discuss patients for whom a multidisciplinary approach is being considered. Final determinations in the plan of care are those of the provider(s). The responsibility for follow up of recommendations given during tumor board is that of the provider.   Today's extended care, comprehensive team conference, Sarah [name] was not present for the discussion and was not examined.

## 2017-11-24 ENCOUNTER — Ambulatory Visit
Admission: RE | Admit: 2017-11-24 | Discharge: 2017-11-24 | Disposition: A | Discharge status: Home / Self Care | Payer: Medicare Other | Source: Ambulatory Visit | Attending: Radiation Oncology | Admitting: Radiation Oncology

## 2017-11-24 ENCOUNTER — Other Ambulatory Visit: Payer: Self-pay

## 2017-11-24 ENCOUNTER — Encounter: Payer: Self-pay | Admitting: Radiation Oncology

## 2017-11-24 DIAGNOSIS — C7951 Secondary malignant neoplasm of bone: Secondary | ICD-10-CM | POA: Diagnosis not present

## 2017-11-24 DIAGNOSIS — C7931 Secondary malignant neoplasm of brain: Secondary | ICD-10-CM | POA: Diagnosis not present

## 2017-11-24 DIAGNOSIS — C73 Malignant neoplasm of thyroid gland: Secondary | ICD-10-CM | POA: Diagnosis not present

## 2017-11-24 DIAGNOSIS — Z510 Encounter for antineoplastic radiation therapy: Secondary | ICD-10-CM | POA: Insufficient documentation

## 2017-11-24 NOTE — Progress Notes (Signed)
Nurse monitoring complete following final SRS treatment to patient's T spine. Vitals stable. No distress noted. Instructed patient to avoid strenuous activity for the next 24 hours. Instructed patient to phone [phone] and request the radiation oncologist with any new neurologic symptoms. Patient verbalized understanding of all reviewed. Also, provided patient with the same written instructions to provide to the facility. Patient aware she should return on 12/18/2017 at 1030 for a follow up appointment. Wheeled patient to the lobby for discharge. Patient accompanied by family.  BP 95/69   Pulse 70   Temp 98.3 F (36.8 C)   Resp 20   LMP 10/22/2016 (LMP Unknown)   SpO2 100%

## 2017-11-25 DIAGNOSIS — S72332D Displaced oblique fracture of shaft of left femur, subsequent encounter for closed fracture with routine healing: Secondary | ICD-10-CM | POA: Diagnosis not present

## 2017-11-25 DIAGNOSIS — G8100 Flaccid hemiplegia affecting unspecified side: Secondary | ICD-10-CM | POA: Diagnosis not present

## 2017-11-25 NOTE — Progress Notes (Addendum)
  Radiation Oncology         [phone] ________________________________  Name: Sarah [name] MRN: [id number]  Date: 11/24/2017  DOB: [birth year]  End of Treatment Note  Diagnosis:   52 yo woman with recurrent T3 spinal metastasis from thyroid cancer     Indication for treatment:  Palliative       Radiation treatment dates:   Fractionated SRS 11/05/2017 to 11/24/2017  Site/dose:   The T-Spine was treated to 50 Gy in 5 fractions of 10 Gy.  Beams/energy:   The targeted metastasis in the T3 vertebral body was treated using 4 Rapid Arc VMAT Beams to a prescription dose of 50 Gy to the gross disease (GTV) delivered in 5 fractions of 10 Gy each. The beams were delivered with 10 MV X-rays in the flattening filter free mode.  Narrative: The patient tolerated SRS treatment relatively well.   The patient did not experience any acute side effects with treatment.   Plan: The patient has completed radiation treatment. The patient will return to radiation oncology clinic for routine followup in one month. I advised her to call or return sooner if she has any questions or concerns related to her recovery or treatment. ________________________________  [name]. [name], M.D.  This document serves as a record of services personally performed by Tyler Pita, MD. It was created on his behalf by Arlyce Harman, a trained medical scribe. The creation of this record is based on the scribe's personal observations and the provider's statements to them. This document has been checked and approved by the attending provider.

## 2017-11-30 DIAGNOSIS — C73 Malignant neoplasm of thyroid gland: Secondary | ICD-10-CM | POA: Diagnosis not present

## 2017-11-30 DIAGNOSIS — G8100 Flaccid hemiplegia affecting unspecified side: Secondary | ICD-10-CM | POA: Diagnosis not present

## 2017-11-30 DIAGNOSIS — I10 Essential (primary) hypertension: Secondary | ICD-10-CM | POA: Diagnosis not present

## 2017-12-02 ENCOUNTER — Encounter: Payer: Medicare Other | Attending: Physical Medicine & Rehabilitation | Admitting: Physical Medicine & Rehabilitation

## 2017-12-02 DIAGNOSIS — L989 Disorder of the skin and subcutaneous tissue, unspecified: Secondary | ICD-10-CM | POA: Diagnosis not present

## 2017-12-03 DIAGNOSIS — C73 Malignant neoplasm of thyroid gland: Secondary | ICD-10-CM | POA: Diagnosis not present

## 2017-12-07 DIAGNOSIS — Z4789 Encounter for other orthopedic aftercare: Secondary | ICD-10-CM | POA: Diagnosis not present

## 2017-12-07 DIAGNOSIS — M6281 Muscle weakness (generalized): Secondary | ICD-10-CM | POA: Diagnosis not present

## 2017-12-08 DIAGNOSIS — M6281 Muscle weakness (generalized): Secondary | ICD-10-CM | POA: Diagnosis not present

## 2017-12-08 DIAGNOSIS — Z4789 Encounter for other orthopedic aftercare: Secondary | ICD-10-CM | POA: Diagnosis not present

## 2017-12-09 DIAGNOSIS — Z4789 Encounter for other orthopedic aftercare: Secondary | ICD-10-CM | POA: Diagnosis not present

## 2017-12-09 DIAGNOSIS — M6281 Muscle weakness (generalized): Secondary | ICD-10-CM | POA: Diagnosis not present

## 2017-12-10 DIAGNOSIS — Z4789 Encounter for other orthopedic aftercare: Secondary | ICD-10-CM | POA: Diagnosis not present

## 2017-12-10 DIAGNOSIS — M6281 Muscle weakness (generalized): Secondary | ICD-10-CM | POA: Diagnosis not present

## 2017-12-11 DIAGNOSIS — M6281 Muscle weakness (generalized): Secondary | ICD-10-CM | POA: Diagnosis not present

## 2017-12-11 DIAGNOSIS — Z4789 Encounter for other orthopedic aftercare: Secondary | ICD-10-CM | POA: Diagnosis not present

## 2017-12-13 DIAGNOSIS — R110 Nausea: Secondary | ICD-10-CM | POA: Diagnosis not present

## 2017-12-13 DIAGNOSIS — Z7401 Bed confinement status: Secondary | ICD-10-CM | POA: Diagnosis not present

## 2017-12-13 DIAGNOSIS — C73 Malignant neoplasm of thyroid gland: Secondary | ICD-10-CM | POA: Diagnosis not present

## 2017-12-13 DIAGNOSIS — C7951 Secondary malignant neoplasm of bone: Secondary | ICD-10-CM | POA: Diagnosis not present

## 2017-12-13 DIAGNOSIS — M5489 Other dorsalgia: Secondary | ICD-10-CM | POA: Diagnosis not present

## 2017-12-13 DIAGNOSIS — K5909 Other constipation: Secondary | ICD-10-CM | POA: Diagnosis not present

## 2017-12-13 DIAGNOSIS — I10 Essential (primary) hypertension: Secondary | ICD-10-CM | POA: Diagnosis not present

## 2017-12-13 DIAGNOSIS — N390 Urinary tract infection, site not specified: Secondary | ICD-10-CM | POA: Diagnosis not present

## 2017-12-13 DIAGNOSIS — K8020 Calculus of gallbladder without cholecystitis without obstruction: Secondary | ICD-10-CM | POA: Diagnosis not present

## 2017-12-13 DIAGNOSIS — R112 Nausea with vomiting, unspecified: Secondary | ICD-10-CM | POA: Diagnosis not present

## 2017-12-13 DIAGNOSIS — R1084 Generalized abdominal pain: Secondary | ICD-10-CM | POA: Diagnosis not present

## 2017-12-13 DIAGNOSIS — J9811 Atelectasis: Secondary | ICD-10-CM | POA: Diagnosis not present

## 2017-12-13 DIAGNOSIS — G8220 Paraplegia, unspecified: Secondary | ICD-10-CM | POA: Diagnosis not present

## 2017-12-13 DIAGNOSIS — Z7951 Long term (current) use of inhaled steroids: Secondary | ICD-10-CM | POA: Diagnosis not present

## 2017-12-13 DIAGNOSIS — K5900 Constipation, unspecified: Secondary | ICD-10-CM | POA: Diagnosis not present

## 2017-12-13 DIAGNOSIS — N318 Other neuromuscular dysfunction of bladder: Secondary | ICD-10-CM | POA: Diagnosis not present

## 2017-12-13 DIAGNOSIS — Z79899 Other long term (current) drug therapy: Secondary | ICD-10-CM | POA: Diagnosis not present

## 2017-12-13 DIAGNOSIS — K592 Neurogenic bowel, not elsewhere classified: Secondary | ICD-10-CM | POA: Diagnosis not present

## 2017-12-13 DIAGNOSIS — R52 Pain, unspecified: Secondary | ICD-10-CM | POA: Diagnosis not present

## 2017-12-13 DIAGNOSIS — T148XXS Other injury of unspecified body region, sequela: Secondary | ICD-10-CM | POA: Diagnosis not present

## 2017-12-13 DIAGNOSIS — Z743 Need for continuous supervision: Secondary | ICD-10-CM | POA: Diagnosis not present

## 2017-12-14 ENCOUNTER — Encounter: Payer: Medicare Other | Admitting: Physical Medicine & Rehabilitation

## 2017-12-14 DIAGNOSIS — K5900 Constipation, unspecified: Secondary | ICD-10-CM | POA: Diagnosis not present

## 2017-12-14 DIAGNOSIS — N390 Urinary tract infection, site not specified: Secondary | ICD-10-CM | POA: Diagnosis not present

## 2017-12-14 DIAGNOSIS — C73 Malignant neoplasm of thyroid gland: Secondary | ICD-10-CM | POA: Diagnosis not present

## 2017-12-14 DIAGNOSIS — G8100 Flaccid hemiplegia affecting unspecified side: Secondary | ICD-10-CM | POA: Diagnosis not present

## 2017-12-14 DIAGNOSIS — Z4789 Encounter for other orthopedic aftercare: Secondary | ICD-10-CM | POA: Diagnosis not present

## 2017-12-14 DIAGNOSIS — M6281 Muscle weakness (generalized): Secondary | ICD-10-CM | POA: Diagnosis not present

## 2017-12-15 DIAGNOSIS — M6281 Muscle weakness (generalized): Secondary | ICD-10-CM | POA: Diagnosis not present

## 2017-12-15 DIAGNOSIS — R1011 Right upper quadrant pain: Secondary | ICD-10-CM | POA: Diagnosis not present

## 2017-12-15 DIAGNOSIS — M25511 Pain in right shoulder: Secondary | ICD-10-CM | POA: Diagnosis not present

## 2017-12-15 DIAGNOSIS — Z4789 Encounter for other orthopedic aftercare: Secondary | ICD-10-CM | POA: Diagnosis not present

## 2017-12-15 DIAGNOSIS — M25512 Pain in left shoulder: Secondary | ICD-10-CM | POA: Diagnosis not present

## 2017-12-16 DIAGNOSIS — Z4789 Encounter for other orthopedic aftercare: Secondary | ICD-10-CM | POA: Diagnosis not present

## 2017-12-16 DIAGNOSIS — M6281 Muscle weakness (generalized): Secondary | ICD-10-CM | POA: Diagnosis not present

## 2017-12-17 DIAGNOSIS — R109 Unspecified abdominal pain: Secondary | ICD-10-CM | POA: Diagnosis not present

## 2017-12-17 DIAGNOSIS — M7551 Bursitis of right shoulder: Secondary | ICD-10-CM | POA: Diagnosis not present

## 2017-12-17 DIAGNOSIS — Z4789 Encounter for other orthopedic aftercare: Secondary | ICD-10-CM | POA: Diagnosis not present

## 2017-12-17 DIAGNOSIS — M6281 Muscle weakness (generalized): Secondary | ICD-10-CM | POA: Diagnosis not present

## 2017-12-17 DIAGNOSIS — M7552 Bursitis of left shoulder: Secondary | ICD-10-CM | POA: Diagnosis not present

## 2017-12-18 ENCOUNTER — Encounter: Payer: Self-pay | Admitting: *Deleted

## 2017-12-18 ENCOUNTER — Telehealth: Payer: Self-pay | Admitting: *Deleted

## 2017-12-18 ENCOUNTER — Ambulatory Visit
Admission: RE | Admit: 2017-12-18 | Discharge: 2017-12-18 | Disposition: A | Discharge status: Home / Self Care | Payer: Medicare Other | Source: Ambulatory Visit | Attending: Urology | Admitting: Urology

## 2017-12-18 DIAGNOSIS — M6281 Muscle weakness (generalized): Secondary | ICD-10-CM | POA: Diagnosis not present

## 2017-12-18 DIAGNOSIS — Z4789 Encounter for other orthopedic aftercare: Secondary | ICD-10-CM | POA: Diagnosis not present

## 2017-12-18 DIAGNOSIS — C7951 Secondary malignant neoplasm of bone: Secondary | ICD-10-CM

## 2017-12-21 DIAGNOSIS — M6281 Muscle weakness (generalized): Secondary | ICD-10-CM | POA: Diagnosis not present

## 2017-12-21 DIAGNOSIS — Z4789 Encounter for other orthopedic aftercare: Secondary | ICD-10-CM | POA: Diagnosis not present

## 2017-12-22 DIAGNOSIS — Z4789 Encounter for other orthopedic aftercare: Secondary | ICD-10-CM | POA: Diagnosis not present

## 2017-12-22 DIAGNOSIS — M6281 Muscle weakness (generalized): Secondary | ICD-10-CM | POA: Diagnosis not present

## 2017-12-23 ENCOUNTER — Ambulatory Visit: Payer: Medicare Other | Admitting: Urology

## 2017-12-23 DIAGNOSIS — B351 Tinea unguium: Secondary | ICD-10-CM | POA: Diagnosis not present

## 2017-12-23 DIAGNOSIS — Z4789 Encounter for other orthopedic aftercare: Secondary | ICD-10-CM | POA: Diagnosis not present

## 2017-12-23 DIAGNOSIS — M2011 Hallux valgus (acquired), right foot: Secondary | ICD-10-CM | POA: Diagnosis not present

## 2017-12-23 DIAGNOSIS — M6281 Muscle weakness (generalized): Secondary | ICD-10-CM | POA: Diagnosis not present

## 2017-12-24 DIAGNOSIS — Z4789 Encounter for other orthopedic aftercare: Secondary | ICD-10-CM | POA: Diagnosis not present

## 2017-12-24 DIAGNOSIS — M6281 Muscle weakness (generalized): Secondary | ICD-10-CM | POA: Diagnosis not present

## 2017-12-25 DIAGNOSIS — Z4789 Encounter for other orthopedic aftercare: Secondary | ICD-10-CM | POA: Diagnosis not present

## 2017-12-25 DIAGNOSIS — M6281 Muscle weakness (generalized): Secondary | ICD-10-CM | POA: Diagnosis not present

## 2017-12-27 DIAGNOSIS — M6281 Muscle weakness (generalized): Secondary | ICD-10-CM | POA: Diagnosis not present

## 2017-12-27 DIAGNOSIS — Z4789 Encounter for other orthopedic aftercare: Secondary | ICD-10-CM | POA: Diagnosis not present

## 2017-12-28 DIAGNOSIS — M6281 Muscle weakness (generalized): Secondary | ICD-10-CM | POA: Diagnosis not present

## 2017-12-28 DIAGNOSIS — Z4789 Encounter for other orthopedic aftercare: Secondary | ICD-10-CM | POA: Diagnosis not present

## 2017-12-29 DIAGNOSIS — M6281 Muscle weakness (generalized): Secondary | ICD-10-CM | POA: Diagnosis not present

## 2017-12-29 DIAGNOSIS — Z4789 Encounter for other orthopedic aftercare: Secondary | ICD-10-CM | POA: Diagnosis not present

## 2017-12-30 DIAGNOSIS — Z4789 Encounter for other orthopedic aftercare: Secondary | ICD-10-CM | POA: Diagnosis not present

## 2017-12-30 DIAGNOSIS — M6281 Muscle weakness (generalized): Secondary | ICD-10-CM | POA: Diagnosis not present

## 2017-12-31 DIAGNOSIS — M6281 Muscle weakness (generalized): Secondary | ICD-10-CM | POA: Diagnosis not present

## 2017-12-31 DIAGNOSIS — Z4789 Encounter for other orthopedic aftercare: Secondary | ICD-10-CM | POA: Diagnosis not present

## 2018-01-01 ENCOUNTER — Other Ambulatory Visit (HOSPITAL_COMMUNITY): Payer: Self-pay | Admitting: Internal Medicine

## 2018-01-01 DIAGNOSIS — M6281 Muscle weakness (generalized): Secondary | ICD-10-CM | POA: Diagnosis not present

## 2018-01-01 DIAGNOSIS — R1011 Right upper quadrant pain: Secondary | ICD-10-CM

## 2018-01-01 DIAGNOSIS — Z4789 Encounter for other orthopedic aftercare: Secondary | ICD-10-CM | POA: Diagnosis not present

## 2018-01-01 DIAGNOSIS — M25511 Pain in right shoulder: Secondary | ICD-10-CM | POA: Diagnosis not present

## 2018-01-01 DIAGNOSIS — R110 Nausea: Secondary | ICD-10-CM

## 2018-01-04 DIAGNOSIS — M6281 Muscle weakness (generalized): Secondary | ICD-10-CM | POA: Diagnosis not present

## 2018-01-04 DIAGNOSIS — Z4789 Encounter for other orthopedic aftercare: Secondary | ICD-10-CM | POA: Diagnosis not present

## 2018-01-05 ENCOUNTER — Ambulatory Visit
Admission: RE | Admit: 2018-01-05 | Discharge: 2018-01-05 | Disposition: A | Discharge status: Home / Self Care | Payer: Medicare Other | Source: Ambulatory Visit | Attending: Urology | Admitting: Urology

## 2018-01-05 ENCOUNTER — Encounter: Payer: Self-pay | Admitting: Urology

## 2018-01-05 VITALS — BP 100/70 | HR 81 | Temp 98.5°F | Resp 20 | Ht 71.0 in | Wt 221.0 lb

## 2018-01-05 DIAGNOSIS — M6281 Muscle weakness (generalized): Secondary | ICD-10-CM | POA: Diagnosis not present

## 2018-01-05 DIAGNOSIS — Z7901 Long term (current) use of anticoagulants: Secondary | ICD-10-CM | POA: Insufficient documentation

## 2018-01-05 DIAGNOSIS — C73 Malignant neoplasm of thyroid gland: Secondary | ICD-10-CM | POA: Diagnosis not present

## 2018-01-05 DIAGNOSIS — C7951 Secondary malignant neoplasm of bone: Secondary | ICD-10-CM | POA: Insufficient documentation

## 2018-01-05 DIAGNOSIS — Z79899 Other long term (current) drug therapy: Secondary | ICD-10-CM | POA: Insufficient documentation

## 2018-01-05 DIAGNOSIS — Z4789 Encounter for other orthopedic aftercare: Secondary | ICD-10-CM | POA: Diagnosis not present

## 2018-01-05 DIAGNOSIS — Z923 Personal history of irradiation: Secondary | ICD-10-CM | POA: Diagnosis not present

## 2018-01-05 NOTE — Progress Notes (Signed)
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 Christella Noa on 09/03/17  1. Fractionated post-op SRS 11/05/2017 to 11/24/2017:  The target T3 lesion was treated to 50 Gy in 5 fractions of 10 Gy.  2. 01/18/14-01/31/14: 30 Gy in 10 fractions from C7-T6  3. Radioactive Iodine in 2015 though she did not respond to this therapy.  Narrative:  The patient returns today for routine follow-up.  She tolerated the fractionated SRS treatment relatively well and did not experience any acute side effects with treatment.                              On review of systems, the patient states that she is doing well overall.  She reports significant improvement in her back pain, currently 2 out of 10 in severity and not requiring narcotic pain medication.  She has known gallstones and is scheduled for a HIDA scan on Wednesday, 01/06/2018 prior to a visit with a general surgeon scheduled for Friday, 01/08/2018.  She thinks that the right shoulder pain/upper back pain is related to her gallstones.  She continues working with physical therapy to regain her motor strength.  She remains confined to a wheelchair at this point as she has not regained any controlled motor function in the LEs and has minimal sensation to light touch in the LEs related to her disease. She continues with bowel/bladder incontinence intermittently but seems to be improving.  ALLERGIES:  is allergic to bee venom; keflex [cephalexin]; tramadol; gabapentin; heparin; hydrochlorothiazide; hydrocodone; oxycodone-acetaminophen; sorbitol; and  tizanidine.  Meds: Current Outpatient Medications  Medication Sig Dispense Refill  . acetaminophen (TYLENOL) 325 MG tablet Take 2 tablets (650 mg total) by mouth every 4 (four) hours as needed for mild pain ((score 1 to 3) or temp > 100.5).    . baclofen (LIORESAL) 20 MG tablet Take 1 tablet (20 mg total) by mouth 3 (three) times daily. 30 each 0  . bisacodyl (DULCOLAX) 10 MG suppository Place 1 suppository (10 mg total) rectally at bedtime. 12 suppository 0  . famotidine (PEPCID) 20 MG tablet Take 1 tablet (20 mg total) by mouth 2 (two) times daily. 60 tablet 0  . fluticasone (FLONASE) 50 MCG/ACT nasal spray Place 2 sprays into both nostrils daily. 16 g 6  . levothyroxine (SYNTHROID, LEVOTHROID) 137 MCG tablet Take 137 mcg by mouth daily before breakfast.    . loratadine (CLARITIN) 10 MG tablet Take 1 tablet (10 mg total) by mouth daily.    . ondansetron (ZOFRAN) 4 MG tablet Take 1 tablet by mouth every 6 (six) hours as needed.    . polyethylene glycol (MIRALAX / GLYCOLAX) packet Take 17 g by mouth daily. 14 each 0  . rivaroxaban (XARELTO) 20 MG TABS tablet Take 20 mg by mouth daily with supper.    . senna (SENOKOT) 8.6 MG TABS tablet Take 1 tablet (8.6 mg total) by mouth 2 (two) times daily. 120 each 0  . sertraline (ZOLOFT) 25 MG tablet Take 1 tablet (25 mg total) by mouth daily. 6  tablet 0  . sucralfate (CARAFATE) 1 GM/10ML suspension Take 10 mLs (1 g total) by mouth 4 (four) times daily -  with meals and at bedtime. 420 mL 0  . pantoprazole (PROTONIX) 40 MG tablet Take 1 tablet by mouth daily.    . sodium phosphate (FLEET) 7-19 GM/118ML ENEM Place 133 mLs (1 enema total) rectally daily as needed for severe constipation. (Patient not taking: Reported on 01/05/2018)  0  . sucralfate (CARAFATE) 1 g tablet Take 1 tablet (1 g total) by mouth 4 (four) times daily -  with meals and at bedtime. Please dissolve tablet in 10-15 mL of water to create a slurry. Patient should drink 10-15 minutes before  each meals and at bedtime to reduce painful swallowing. (Patient not taking: Reported on 01/05/2018) 120 tablet 2   No current facility-administered medications for this encounter.     Physical Findings:  height is 5\' 11"  (1.803 m) and weight is 221 lb (100.2 kg). Her oral temperature is 98.5 F (36.9 C). Her blood pressure is 100/70 and her pulse is 81. Her respiration is 20 and oxygen saturation is 100%.  Pain Assessment Pain Score: 2 (Tylenol) Pain Frequency: Several days a week Pain Loc: Back/10 In general this is a well appearing Caucasian female in no acute distress.  She's alert and oriented x4 and appropriate throughout the examination. Cardiopulmonary assessment is negative for acute distress and she exhibits normal effort. No active motor function in the LEs and minimal sensation to light tough bilateral LEs.  Lab Findings: Lab Results  Component Value Date   WBC 3.7 (L) 11/06/2017   HGB 9.1 (L) 11/06/2017   HCT 28.8 (L) 11/06/2017   MCV 101.4 (H) 11/06/2017   PLT 171 11/06/2017     Radiographic Findings: No results found.  Impression/Plan: 22. 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 She appears to have recovered well from the effects of her recent repeat post operative radiotherapy following repeat resection/debulking of the recurrent tumor at T3.  She will continue in routine follow-up under the care and direction of [name] in medical oncology and has a scheduled follow-up visit on 01/07/2018 and anticipates resuming Lenvatinib at that time.  Follow-up imaging will be at the discretion of [name] moving forward.  We discussed that while we are happy to continue to participate in her care if clinically indicated, at this point, we will plan to see her back on an as-needed basis.     She is comfortable with and in agreement with the stated plan.    Nicholos Johns, PA-C

## 2018-01-06 ENCOUNTER — Encounter (HOSPITAL_COMMUNITY): Payer: Self-pay

## 2018-01-06 ENCOUNTER — Encounter (HOSPITAL_COMMUNITY)
Admission: RE | Admit: 2018-01-06 | Discharge: 2018-01-06 | Disposition: A | Discharge status: Home / Self Care | Payer: Medicare Other | Source: Ambulatory Visit | Attending: Internal Medicine | Admitting: Internal Medicine

## 2018-01-06 DIAGNOSIS — R110 Nausea: Secondary | ICD-10-CM | POA: Diagnosis not present

## 2018-01-06 DIAGNOSIS — R1011 Right upper quadrant pain: Secondary | ICD-10-CM | POA: Diagnosis not present

## 2018-01-06 DIAGNOSIS — Z4789 Encounter for other orthopedic aftercare: Secondary | ICD-10-CM | POA: Diagnosis not present

## 2018-01-06 DIAGNOSIS — R112 Nausea with vomiting, unspecified: Secondary | ICD-10-CM | POA: Diagnosis not present

## 2018-01-06 DIAGNOSIS — M6281 Muscle weakness (generalized): Secondary | ICD-10-CM | POA: Diagnosis not present

## 2018-01-06 DIAGNOSIS — E039 Hypothyroidism, unspecified: Secondary | ICD-10-CM | POA: Diagnosis not present

## 2018-01-06 MED ORDER — SINCALIDE 5 MCG IJ SOLR
INTRAMUSCULAR | Status: AC
  Administered 2018-01-06: 2.01 ug via INTRAVENOUS
  Filled 2018-01-06: qty 5

## 2018-01-06 MED ORDER — SODIUM CHLORIDE 0.9% FLUSH
INTRAVENOUS | Status: AC
  Filled 2018-01-06: qty 40

## 2018-01-06 MED ORDER — STERILE WATER FOR INJECTION IJ SOLN
INTRAMUSCULAR | Status: AC
  Administered 2018-01-06: 2.01 mL via INTRAVENOUS
  Filled 2018-01-06: qty 10

## 2018-01-06 MED ORDER — TECHNETIUM TC 99M MEBROFENIN IV KIT
5.0000 | PACK | Freq: Once | INTRAVENOUS | Status: AC | PRN
  Administered 2018-01-06: 5 via INTRAVENOUS

## 2018-01-07 ENCOUNTER — Inpatient Hospital Stay (HOSPITAL_BASED_OUTPATIENT_CLINIC_OR_DEPARTMENT_OTHER): Payer: Medicare Other | Admitting: Hematology

## 2018-01-07 ENCOUNTER — Telehealth: Payer: Self-pay | Admitting: Hematology

## 2018-01-07 ENCOUNTER — Inpatient Hospital Stay: Payer: Medicare Other | Attending: Hematology

## 2018-01-07 VITALS — BP 102/77 | HR 83 | Temp 97.7°F | Resp 18 | Ht 71.0 in

## 2018-01-07 DIAGNOSIS — C73 Malignant neoplasm of thyroid gland: Secondary | ICD-10-CM | POA: Insufficient documentation

## 2018-01-07 DIAGNOSIS — Z86711 Personal history of pulmonary embolism: Secondary | ICD-10-CM

## 2018-01-07 DIAGNOSIS — C7802 Secondary malignant neoplasm of left lung: Secondary | ICD-10-CM | POA: Insufficient documentation

## 2018-01-07 DIAGNOSIS — Z79899 Other long term (current) drug therapy: Secondary | ICD-10-CM | POA: Diagnosis not present

## 2018-01-07 DIAGNOSIS — C7801 Secondary malignant neoplasm of right lung: Secondary | ICD-10-CM | POA: Diagnosis not present

## 2018-01-07 DIAGNOSIS — M6281 Muscle weakness (generalized): Secondary | ICD-10-CM | POA: Diagnosis not present

## 2018-01-07 DIAGNOSIS — R945 Abnormal results of liver function studies: Secondary | ICD-10-CM | POA: Diagnosis not present

## 2018-01-07 DIAGNOSIS — C7951 Secondary malignant neoplasm of bone: Secondary | ICD-10-CM | POA: Diagnosis not present

## 2018-01-07 DIAGNOSIS — C778 Secondary and unspecified malignant neoplasm of lymph nodes of multiple regions: Secondary | ICD-10-CM

## 2018-01-07 DIAGNOSIS — Z4789 Encounter for other orthopedic aftercare: Secondary | ICD-10-CM | POA: Diagnosis not present

## 2018-01-07 DIAGNOSIS — Z7901 Long term (current) use of anticoagulants: Secondary | ICD-10-CM | POA: Diagnosis not present

## 2018-01-07 DIAGNOSIS — R110 Nausea: Secondary | ICD-10-CM

## 2018-01-07 LAB — CBC WITH DIFFERENTIAL/PLATELET
Abs Immature Granulocytes: 0.02 10*3/uL (ref 0.00–0.07)
Basophils Absolute: 0 10*3/uL (ref 0.0–0.1)
Basophils Relative: 0 %
EOS PCT: 4 %
Eosinophils Absolute: 0.1 10*3/uL (ref 0.0–0.5)
HEMATOCRIT: 29.1 % — AB (ref 36.0–46.0)
Hemoglobin: 9.1 g/dL — ABNORMAL LOW (ref 12.0–15.0)
Immature Granulocytes: 1 %
LYMPHS ABS: 0.7 10*3/uL (ref 0.7–4.0)
Lymphocytes Relative: 22 %
MCH: 30.7 pg (ref 26.0–34.0)
MCHC: 31.3 g/dL (ref 30.0–36.0)
MCV: 98.3 fL (ref 80.0–100.0)
MONO ABS: 0.2 10*3/uL (ref 0.1–1.0)
Monocytes Relative: 6 %
Neutro Abs: 2.1 10*3/uL (ref 1.7–7.7)
Neutrophils Relative %: 67 %
Platelets: 143 10*3/uL — ABNORMAL LOW (ref 150–400)
RBC: 2.96 MIL/uL — ABNORMAL LOW (ref 3.87–5.11)
RDW: 14.2 % (ref 11.5–15.5)
WBC: 3.2 10*3/uL — ABNORMAL LOW (ref 4.0–10.5)
nRBC: 0 % (ref 0.0–0.2)

## 2018-01-07 LAB — CMP (CANCER CENTER ONLY)
ALK PHOS: 159 U/L — AB (ref 38–126)
ALT: <6 U/L (ref 0–44)
ANION GAP: 7 (ref 5–15)
AST: 12 U/L — ABNORMAL LOW (ref 15–41)
Albumin: 2.3 g/dL — ABNORMAL LOW (ref 3.5–5.0)
BILIRUBIN TOTAL: 0.4 mg/dL (ref 0.3–1.2)
BUN: 16 mg/dL (ref 6–20)
CALCIUM: 8.1 mg/dL — AB (ref 8.9–10.3)
CO2: 29 mmol/L (ref 22–32)
Chloride: 107 mmol/L (ref 98–111)
Creatinine: 1.11 mg/dL — ABNORMAL HIGH (ref 0.44–1.00)
GFR, Est AFR Am: >60 mL/min (ref >60)
GFR, Estimated: 56 mL/min — ABNORMAL LOW (ref >60)
Glucose, Bld: 110 mg/dL — ABNORMAL HIGH (ref 70–99)
Potassium: 3.8 mmol/L (ref 3.5–5.1)
Sodium: 143 mmol/L (ref 135–145)
TOTAL PROTEIN: 5.4 g/dL — AB (ref 6.5–8.1)

## 2018-01-07 LAB — TSH: TSH: 21.116 u[IU]/mL — ABNORMAL HIGH (ref 0.308–3.960)

## 2018-01-07 NOTE — Telephone Encounter (Signed)
Gave pt avs and calendar  °

## 2018-01-07 NOTE — Progress Notes (Signed)
[name] Kitchen    HEMATOLOGY/ONCOLOGY CLINIC NOTE  Date of Service: .01/07/2018    Patient Care Team: [name], MD as PCP - General (Family Medicine) [name], MD as Consulting Physician (Physical Medicine and Rehabilitation) Ashok Pall, MD as Consulting Physician (Neurosurgery) Brunetta Genera, MD as Consulting Physician (Hematology) Jacelyn Pi, MD as Consulting Physician (Endocrinology)   Dayton Bailiff MD (Spine [place]) [phone]. 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  01/09/2014- patient underwent total thyroidectomy by Dr. Elie Goody. Pathology showed Hurthle cell carcinoma with no perineural invasion, presence of lymphovascular invasion and extra thyroidal extension. p T3, p N0, Mx  Received 30 grays in 10 fractions to her C7-T6 lesion finished in November 2015  Received 135 mCi of iodine 131 on 07/21/2014. Whole-body scan was apparently negative.  07/05/2015-07/10/2015 admitted to [facility] for leg weakness. MRI of the T-spine showed recurrent tumor causing spinal cord compression. Patient was emergently taken to the or and underwent a redo thoracic laminectomy of T3 and partial tumor resection.   08/07/2015 PET/CT scan of the chest abdomen pelvis showed slight enlargement of FDG avid left level III and level IV lymph nodes concerning for worsening nodal metastases. Slight enlargement of the hypermetabolic right superior mediastinal mass and multiple metabolically active pulmonary nodules representing worsening metastases. T3 vertebral body and bilateral pedicle metastases  status post posture metallic fusion. No residual uptake in the thyroidectomy bed  Thyroglobulin level was 11.7 (thyroglobulin antibody present 9.8)   HISTORY OF PRESENTING ILLNESS:   Sarah [name] is a wonderful 52 y.o. female who has been referred to [place] by Dr Leamon Arnt (medical oncology at [facility]) for help with local comanagement of the patient's metastatic Hurthle cell thyroid carcinoma with details as noted above. Patient had recent hospitalization from 4/13/7 to 07/10/2015 at [facility] for lower extremity weakness and was noted to have cord compression from recurrent tumor at T3. She is status post redo thoracic laminectomy of T3 and partial tumor resection to try to decompress the core. Pathology was consistent with metastatic Hurthle cell carcinoma.  She has been on dexamethasone 2 mg by mouth twice a day. Continue to have lower extremity weakness and is currently in a skilled nursing facility getting rehabilitation. She feels her lower extremity weakness is possibly getting somewhat better.  Patient had a repeat PET/CT scan at [facility] on 08/07/2015 as noted above which shows progression of her metastatic disease including nodal metastases , pulmonary nodules and right superior mediastinal mass.  MRI of the spine on 08/14/2015 showed improved but persistent cord compression at T3 level.  She was started by Dr. Leamon Arnt on Lenvatinib and 14 mg by mouth daily for progressive metastatic disease after ruling out the option of repeat surgery with Dr. Cyndy Freeze and reradiation with Dr. Ledon Snare.  After a week or so the patient was increased to a dose of 53m po daily. She notes no diarrhea or other gastrointestinal symptoms. No skin rashes.  Is noted to have oral thrush and was started on nystatin swish and swallow.   Patient has a history of  pulmonary embolism 2015 and has continued to be on Xarelto for anticoagulation.   INTERVAL HISTORY  ITZA MANIACI returns today  for a follow up regarding her Metastatic Hurthle cell thyroid carcinoma. The patient's last visit with [place] was on 11/06/17. She is accompanied today by her mother. The pt reports that she is doing well overall.   The pt completed SRS treatment with Dr. [name] from 11/05/17 to 11/24/17 to the T3 lesion which was treated with 50Gy in 5 fractions.   The pt reports that she will be seeing Dr. Marlowe Kays in surgery tomorrow 01/08/18 for evaluation of her gallbladder and recurring nausea. She also notes some referred pain to her right should blade.    The pt notes that her legs are hypersensitive and has followed up with neurosurgery. She continues with PT and is still living in a rehabilitation clinic and endorses improvement. She adds that her leg swelling has reduced and she is taking Xarelto.   The pt notes that she is receiving 172mg Levothyroxine at 1:30am from staff, and is only taking this medication with water. She continues follow up with Dr. BChalmers Caterfor management of her thyroid replacement.   Of note since the patient's last visit, pt has had an NM Hepato w/eject fract completed on 01/06/18 with results revealing Patent cystic duct without evidence for acute cholecystitis. 2. Normal gallbladder ejection fraction.  Lab results today (01/07/18) of CBC w/diff, CMP is as follows: all values are WNL except for WBC at 3.2k, RBC at 2.96, HGB at 9.1, HCT at 29.1, PLT at 143k, Glucose at 110, Creatinine at 1.11, Calcium at 8.1, Total Protein at 5.4, Albumin at 2.3, AST at 12, Alk Phos at 159, GFR at 56. 01/07/18 TSH is at 21.116 01/07/18 Thyroglobulin antibody is pending  On review of systems, pt reports recurring nausea, hypersensitive legs, improved leg swelling, some back pain, referred right shoulder pain, and denies fevers, chills, night sweats, and any other symptoms.   MEDICAL HISTORY:  Past Medical History:  Diagnosis Date  . Cancer ([facility]    FNA of thyroid positive for onconytic/hurthle cell  carcinoma  . Chronic back pain   . DDD (degenerative disc disease), cervical   . DJD (degenerative joint disease)   . Family history of adverse reaction to anesthesia    MOTHER HAS NAUSEA   . History of rectal fissure   . 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 Hypertension   . Obesity   . 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 Pulmonary embolus, right ([facility] 2015  . Rotator cuff tendonitis right    SURGICAL HISTORY: Past Surgical History:  Procedure Laterality Date  . FEMUR IM NAIL Left 09/11/2017   Procedure: INTRAMEDULLARY (IM) RETROGRADE FEMORAL NAILING;  Surgeon: HAltamese Daniel MD;  [facility]  Service: Orthopedics;  Laterality: Left;  . LAMINECTOMY N/A 12/14/2013   Procedure: THORACIC LAMINECTOMY FOR TUMOR THORACIC THREE;  Surgeon: KAshok Pall MD;  [place] [facility];  Service: Neurosurgery;  Laterality: N/A;  THORACIC LAMINECTOMY FOR TUMOR THORACIC THREE  . LAMINECTOMY N/A 07/05/2015   Procedure: THORACIC LAMINECTOMY FOR TUMOR;  Surgeon: KAshok Pall MD;  [place] [facility];  Service: Neurosurgery;  Laterality: N/A;  . LAMINECTOMY N/A 09/03/2017   Procedure: POSTERIOR SPINAL TUMOR RESECTION THORACIC THREE;  Surgeon: CAshok Pall MD;  [place] [facility]  Service: Neurosurgery;  Laterality: N/A;  POSTERIOR SPINAL TUMOR RESECTION THORACIC THREE  . POSTERIOR LUMBAR FUSION 4 LEVEL N/A 12/30/2013   Procedure:  Thoracic one-Thoracic five posterior thoracic fusion with pedicle screws;  Surgeon: Ashok Pall, MD;  Location: [place] [facility];  Service: Neurosurgery;  Laterality: N/A;  Thoracic one-Thoracic five posterior thoracic fusion with pedicle screws  . THYROIDECTOMY N/A 01/09/2014   Procedure: TOTAL THYROIDECTOMY;  Surgeon: Izora Gala, MD;  Location: [place];  Service: ENT;  Laterality: N/A;  . TONSILLECTOMY      SOCIAL HISTORY: Social History   Socioeconomic History  . Marital status: Single    Spouse name: Not on file  . Number of children: Not on file  . Years  of education: Not on file  . Highest education level: Not on file  Occupational History  . Not on file  Social Needs  . Financial resource strain: Not on file  . Food insecurity:    Worry: Not on file    Inability: Not on file  . Transportation needs:    Medical: Not on file    Non-medical: Not on file  Tobacco Use  . Smoking status: Never Smoker  . Smokeless tobacco: Never Used  Substance and Sexual Activity  . Alcohol use: No  . Drug use: No  . Sexual activity: Not Currently  Lifestyle  . Physical activity:    Days per week: Not on file    Minutes per session: Not on file  . Stress: Not on file  Relationships  . Social connections:    Talks on phone: Not on file    Gets together: Not on file    Attends religious service: Not on file    Active member of club or organization: Not on file    Attends meetings of clubs or organizations: Not on file    Relationship status: Not on file  . Intimate partner violence:    Fear of current or ex partner: No    Emotionally abused: No    Physically abused: No    Forced sexual activity: No  Other Topics Concern  . Not on file  Social History Narrative  . Not on file    FAMILY HISTORY: Family History  Problem Relation Age of Onset  . Hypertension Mother   . Diabetes Mother   . Hypertension Father   . Stroke Father   . Diabetes Father   . Diabetes Sister   . Diabetes Sister     ALLERGIES:  is allergic to bee venom; keflex [cephalexin]; tramadol; gabapentin; heparin; hydrochlorothiazide; hydrocodone; oxycodone-acetaminophen; sorbitol; and tizanidine.  MEDICATIONS:  Current Outpatient Medications  Medication Sig Dispense Refill  . acetaminophen (TYLENOL) 325 MG tablet Take 2 tablets (650 mg total) by mouth every 4 (four) hours as needed for mild pain ((score 1 to 3) or temp > 100.5).    . baclofen (LIORESAL) 20 MG tablet Take 1 tablet (20 mg total) by mouth 3 (three) times daily. 30 each 0  . bisacodyl (DULCOLAX) 10 MG  suppository Place 1 suppository (10 mg total) rectally at bedtime. 12 suppository 0  . famotidine (PEPCID) 20 MG tablet Take 1 tablet (20 mg total) by mouth 2 (two) times daily. 60 tablet 0  . fluticasone (FLONASE) 50 MCG/ACT nasal spray Place 2 sprays into both nostrils daily. 16 g 6  . levothyroxine (SYNTHROID, LEVOTHROID) 137 MCG tablet Take 137 mcg by mouth daily before breakfast.    . loratadine (CLARITIN) 10 MG tablet Take 1 tablet (10 mg total) by mouth daily.    . ondansetron (ZOFRAN) 4 MG tablet Take 1 tablet by mouth every 6 (six) hours as  needed.    . pantoprazole (PROTONIX) 40 MG tablet Take 1 tablet by mouth daily.    . polyethylene glycol (MIRALAX / GLYCOLAX) packet Take 17 g by mouth daily. 14 each 0  . rivaroxaban (XARELTO) 20 MG TABS tablet Take 20 mg by mouth daily with supper.    . senna (SENOKOT) 8.6 MG TABS tablet Take 1 tablet (8.6 mg total) by mouth 2 (two) times daily. 120 each 0  . sertraline (ZOLOFT) 25 MG tablet Take 1 tablet (25 mg total) by mouth daily. 6 tablet 0  . sodium phosphate (FLEET) 7-19 GM/118ML ENEM Place 133 mLs (1 enema total) rectally daily as needed for severe constipation. (Patient not taking: Reported on 01/05/2018)  0  . sucralfate (CARAFATE) 1 g tablet Take 1 tablet (1 g total) by mouth 4 (four) times daily -  with meals and at bedtime. Please dissolve tablet in 10-15 mL of water to create a slurry. Patient should drink 10-15 minutes before each meals and at bedtime to reduce painful swallowing. (Patient not taking: Reported on 01/05/2018) 120 tablet 2  . sucralfate (CARAFATE) 1 GM/10ML suspension Take 10 mLs (1 g total) by mouth 4 (four) times daily -  with meals and at bedtime. 420 mL 0   No current facility-administered medications for this visit.     REVIEW OF SYSTEMS:    A 10+ POINT REVIEW OF SYSTEMS WAS OBTAINED including neurology, dermatology, psychiatry, cardiac, respiratory, lymph, extremities, GI, GU, Musculoskeletal, constitutional,  breasts, reproductive, HEENT.  All pertinent positives are noted in the HPI.  All others are negative.   PHYSICAL EXAMINATION:  ECOG PERFORMANCE STATUS: 3 - Symptomatic, >50% confined to bed  Vitals:   01/07/18 1020  BP: 102/77  Pulse: 83  Resp: 18  Temp: 97.7 F (36.5 C)  SpO2: 100%   Filed Weights   .Body mass index is 30.82 kg/m.   GENERAL:alert, in no acute distress and comfortable SKIN: no acute rashes, no significant lesions EYES: conjunctiva are pink and non-injected, sclera anicteric OROPHARYNX: MMM, no exudates, no oropharyngeal erythema or ulceration NECK: supple, no JVD LYMPH:  no palpable lymphadenopathy in the cervical, axillary or inguinal regions LUNGS: clear to auscultation b/l with normal respiratory effort HEART: regular rate & rhythm ABDOMEN:  normoactive bowel sounds , non tender, not distended. No palpable hepatosplenomegaly.  Extremity: 1+ pedal edema b/l PSYCH: alert & oriented x 3 with fluent speech NEURO: no focal motor/sensory deficits   LABORATORY DATA:  I have reviewed the data as listed  . CBC Latest Ref Rng & Units 01/07/2018 11/06/2017 10/26/2017  WBC 4.0 - 10.5 K/uL 3.2(L) 3.7(L) 3.2(L)  Hemoglobin 12.0 - 15.0 g/dL 9.1(L) 9.1(L) 8.0(L)  Hematocrit 36.0 - 46.0 % 29.1(L) 28.8(L) 26.5(L)  Platelets 150 - 400 K/uL 143(L) 171 156   ANC 2.3k . CMP Latest Ref Rng & Units 01/07/2018 11/06/2017 10/26/2017  Glucose 70 - 99 mg/dL 110(H) 93 90  BUN 6 - 20 mg/dL _0 Creatinine 0.44 - 1.00 mg/dL 1.11(H) 0.88 0.88  Sodium 135 - 145 mmol/L 143 141 143  Potassium 3.5 - 5.1 mmol/L 3.8 3.8 3.8  Chloride 98 - 111 mmol/L 107 108 111  CO2 22 - 32 mmol/L _1 Calcium 8.9 - 10.3 mg/dL 8.1(L) 7.5(L) 7.6(L)  Total Protein 6.5 - 8.1 g/dL 5.4(L) 5.4(L) 4.6(L)  Total Bilirubin 0.3 - 1.2 mg/dL 0.4 0.5 1.1  Alkaline Phos 38 - 126 U/L 159(H) 236(H) 233(H)  AST 15 - 41 U/L  12(L) 15 18  ALT 0 - 44 U/L <_0 RADIOGRAPHIC STUDIES: I have  personally reviewed the radiological images as listed and agreed with the findings in the report.  .Nm Hepato W/eject Fract  Result Date: 01/06/2018 CLINICAL DATA:  Chronic abdominal pain.  Nausea and vomiting. EXAM: NUCLEAR MEDICINE HEPATOBILIARY IMAGING WITH GALLBLADDER EF TECHNIQUE: Sequential images of the abdomen were obtained out to 60 minutes following intravenous administration of radiopharmaceutical. After slow intravenous infusion of 2.01 micrograms Cholecystokinin, gallbladder ejection fraction was determined. RADIOPHARMACEUTICALS:  5.0 mCi Tc-65mCholetec IV COMPARISON:  12/13/2017 FINDINGS: Prompt uptake and biliary excretion of activity by the liver is seen. Gallbladder activity is visualized, consistent with patency of cystic duct. Biliary activity passes into small bowel, consistent with patent common bile duct. Calculated gallbladder ejection fraction is 73%. (At 60 min, normal ejection fraction is greater than 40%.) IMPRESSION: 1. Patent cystic duct without evidence for acute cholecystitis. 2. Normal gallbladder ejection fraction. Electronically Signed   By: TKerby MoorsM.D.   On: 01/06/2018 13:56     ASSESSMENT & PLAN:   52year old female with   1) Stage IVC metastatic Hurthle cell thyroid carcinoma with metastases to the spine, lung, superior mediastinal mass and nodal metastases.  Patient has had treatment as noted above. h/o cord compression at T3 level due to recurrent tumor status post partial resection and debulking.   MRI on 08/14/2015 showed improved but persistent cord compression at the T3 level. She has been noted not to be a candidate for further repeat surgery at this time or repeat radiation. [facility] showed evidence of tumor progression and patient was started on lenvatinib as per Dr. MLeamon Arnt Her thyroglobulin level was 11.7 with anti-body of 9.8 [place] [place] 11/01/2015: shows relatively  stable though high burden of disease. Some concern of local recurrence in the thyroidectomy bed which is asymptomatic at this time. MRI thoracic spine 12/25/2015: No new abnormality since the prior examination. Status post T1-5 fusion for T3 decompression. Mass effect on the cord at the T3 level appears improved compared to the prior exam. Postoperative fluid collection likely representing a seroma seen on the prior study has slightly decreased in size.  PET/CT 06/03/2016 with Generally reduced hypermetabolic activity in the previously identified lesions, including the pulmonary nodules, dominant upper thoracic metastatic focus, and left lower cervical adenopathy. Mildly reduced size of previous lesions as well. No new lesions.  PET/CT 12/12/2016: Concern for progression of metastatic Hurthle cell thyroid carcinoma with significant increase in metabolic activity of several lymph nodes, pulmonary nodules, and skeletal metastasis. Most convincing element of progression is a significant increase in metabolic activity (more than doubling). Mild increase in size of the lymph nodes.  PET/CT  21/02/72 Partial metabolic response. Left supraclavicular and right paratracheal nodal metastases, bilateral pulmonary metastases, and osseous metastases involving the upper thoracic spine and right sternum, with mild decrease in metabolism, as described above.   10/26/17 CT Thoracic Spine revealed Severe spinal stenosis at T3 from recurrent epidural tumor with complete effacement of the thecal sac and partial myelographic block. 2. Epidural tumor extends superiorly to T1 and inferiorly to T5 with milder spinal stenosis. 3. New nodular soft tissue dorsally in the canal at L1 and L2 which results in mild spinal stenosis and presumably reflects tumor as well, though it is unclear if this is completely extradural or partially intradural as well.  4. Unchanged appearance of T1-T5 posterior fusion, severe T3 pathologic fracture,  and T2-T4 osseous tumor compared to the recent noncontrast CT.   PLAN:  -Recommend that the pt increase her protein consumption and optimize her nutrition with the nutrition therapist -Discussed pt labwork today, 01/07/18; blood counts stable.TSH is high at 21.116, could use an increased dose of Levothyroxine.   -01/07/18 Thyroglobulin antibody and level are pending  -Pt completed 50Gy in 5 fractions from 11/05/17 to 11/24/17 with Dr. [name] -Advised that pt seek increased dose of Levothyroxine with Dr. Chalmers Cater -Will continue to allow pt to recover from surgery and continue rehab before adding back Lenvatinib  -Pt is following up with surgery for consideration of a cholecystectomy  -Continue Xarelto indefinitely, but hold 48 hours before possibly cholecystectomy   -Will order PET/CT for completion before next visit  -Will see the pt back in 2 months   2) s/p Grade 1 oral mucositis related to Lenvatinib -resolved  3) FDG avidity in the rectum - much improved. No significant concerns on last PET  4) abnormal LFTs likely from gallstones - last HIDA - Patent cystic duct without evidence for acute cholecystitis. 2. Normal gallbladder ejection fraction.  5) history of right-sided pulmonary embolism on chronic anticoagulation with Rivaroxaban. Monitor for bleeding. -continue Xarelto   PET/CT in 8 weeks RTC with Dr Irene Limbo in 23month with labs    All of the patients questions were answered with apparent satisfaction. The patient knows to call the clinic with any problems, questions or concerns.   The total time spent in the appt was 25 minutes and more than 50% was on counseling and direct patient cares.     GSullivan LoneMD MS AAHIVMS SLehigh Regional Medical CenterCSf Nass[facility] Asc Dba East Hills Surgery CenterHematology/Oncology Physician [facility]ur[facility] (Office):       [phone](Work cell):  [phone](Fax):           [phone] I, SBaldwin Jamaica am acting as a scribe for Dr. KIrene Limbo .I have reviewed the above documentation for  accuracy and completeness, and I agree with the above. .Brunetta GeneraMD

## 2018-01-08 ENCOUNTER — Ambulatory Visit: Payer: Self-pay | Admitting: Surgery

## 2018-01-08 DIAGNOSIS — K8050 Calculus of bile duct without cholangitis or cholecystitis without obstruction: Secondary | ICD-10-CM | POA: Diagnosis not present

## 2018-01-08 LAB — THYROGLOBULIN ANTIBODY: Thyroglobulin Antibody: <1 IU/mL (ref 0.0–0.9)

## 2018-01-08 NOTE — H&P (Signed)
[name] Documented: 01/08/2018 4:17 PM Location: [facility] Patient #: [id number] DOB: Aug 02, 1965 Single / Language: Cleophus Molt / Race: White Female  History of Present Illness (Erisa Mehlman A. Jefrey Raburn MD; 01/08/2018 4:35 PM) Patient words: 52 year old lady with a 5+ month history of severe nausea, intermittent epigastric and right upper quadrant pain radiating to her right shoulder blade. Nausea is pretty well controlled with Phenergan and Zofran. Nausea is not specifically related to oral intake, it is worse in the morning when she wakes up and can happen throughout the day or throughout the night. She does have emesis occasionally and she describes emesis of whatever food she is eating, no sour or bilious appearing vomit. No prior abdominal surgeries.  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 Has also had radiation to the spine completed September 3. Currently being treated with Lenvatinib (paused for recent surgery). She sees 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 CBC with a white count of 3.2, hemoglobin 9.1, platelets 143. TSH elevated at 21- sees Dr. Chalmers Cater in endo. HIDA 2 days ago normal Ultrasound June of this year with cholelithiasis, fatty liver   "PET/CT 12/05/76: Partial metabolic response. Left supraclavicular and right paratracheal nodal metastases, bilateral pulmonary metastases, and osseous metastases involving the upper thoracic spine and right sternum, with mild decrease in metabolism, as described above.  10/26/17 CT Thoracic Spine revealed Severe spinal stenosis at T3 from  recurrent epidural tumor with complete effacement of the thecal sac and partial myelographic block. 2. Epidural tumor extends superiorly to T1 and inferiorly to T5 with milder spinal stenosis. 3. New nodular soft tissue dorsally in the canal at L1 and L2 which results in mild spinal stenosis and presumably reflects tumor as well, though it is unclear if this is completely extradural or partially intradural as well. 4. Unchanged appearance of T1-T5 posterior fusion, severe T3 pathologic fracture, and T2-T4 osseous tumor compared to the recent noncontrast CT." from Dr. Pollie Meyer last office note.  The patient is a 52 year old female.   Past Surgical History Geni Bers [place], RMA; 01/08/2018 4:17 PM) Thyroid Surgery Tonsillectomy  Diagnostic Studies History Geni Bers [place], RMA; 01/08/2018 4:17 PM) Colonoscopy never Mammogram >3 years ago Pap Smear 1-5 years ago  Allergies Geni Bers [place], RMA; 01/08/2018 4:20 PM) Keflex *CEPHALOSPORINS* Nausea, Vomiting. traMADol HCl *ANALGESICS - OPIOID* Nausea, Vomiting. Gabapentin *ANTICONVULSANTS* Nausea, Vomiting. Heparins hydroCHLOROthiazide *DIURETICS* Nausea, Vomiting. HYDROcodone-Acetaminophen *ANALGESICS - OPIOID* Nausea, Vomiting. Allergies Reconciled  Medication History [employer], RMA; 01/08/2018 4:21 PM) HYDROcodone-Acetaminophen (5-325MG  Tablet, Oral) Active. traMADol HCl (50MG  Tablet, Oral) Active. Baclofen (10MG  Tablet, Oral) Active. Levothyroxine Sodium (150MCG Tablet, Oral) Active. Lidocaine HCl (1% Solution, Injection) Active. Methocarbamol (750MG  Tablet, Oral) Active. methylPREDNISolone Acetate (40MG /ML Suspension, Injection) Active. Ondansetron HCl (8MG  Tablet, Oral) Active. Pantoprazole Sodium (40MG  Tablet DR, Oral) Active. Sertraline HCl (50MG  Tablet, Oral) Active. Sulfamethoxazole-Trimethoprim (800-160MG  Tablet, Oral) Active. Sucralfate (1GM Tablet, Oral) Active. tiZANidine HCl (2MG   Tablet, Oral) Active. Xarelto (20MG  Tablet, Oral) Active. Medications Reconciled  Social History Geni Bers [place], RMA; 01/08/2018 4:17 PM) Caffeine use Carbonated beverages, Tea. No drug use Tobacco use Never smoker.  Family History Geni Bers [place], [place]; 01/08/2018 4:17 PM) Cerebrovascular Accident Father.  Diabetes Mellitus Father, Mother, Sister. Hypertension Father, Mother.  Pregnancy / Birth History Geni Bers [place], RMA; 01/08/2018 4:17 PM) Age at menarche 73 years. Gravida 0 Irregular periods Para 0  Other Problems [employer], RMA; 01/08/2018 4:17 PM) Bladder Problems Cancer Cholelithiasis Gastroesophageal Reflux Disease High blood pressure Pulmonary Embolism / Blood Clot in Legs Thyroid Cancer Thyroid Disease     Review of Systems (Ledarrius Beauchaine A. Kae Heller MD; 01/08/2018 4:36 PM) All other systems negative  Vitals (Jacqueline Haggett RMA; 01/08/2018 4:22 PM) 01/08/2018 4:21 PM Weight: 220 lb Height: 71in Body Surface Area: 2.2 m Body Mass Index: 30.68 kg/m  Pain Level: 2/10 Temp.: 96.57F(Temporal)  Pulse: 87 (Regular)  P.OX: 99% (Room air) BP: 100/78 (Sitting, Left Arm, Standard)      Physical Exam (Osiah Haring A. Kae Heller MD; 01/08/2018 4:37 PM)  The physical exam findings are as follows: Note:Gen: alert and chronically ill-appearing Eye: extraocular motion intact, no scleral icterus ENT: moist mucus membranes, dentition intact Neck: Supple, no midline tenderness Chest: unlabored respirations, symmetrical air entry, clear bilaterally CV: regular rate and rhythm, bilateral nonpitting lower extremity edema Abdomen: soft, mildly tender in right subcostal margin, epigastrium and left paramedian field, nondistended. No mass or organomegaly neuro/MSK: Bilateral lower community weakness, wheelchair-bound, no deformity Psych: normal mood and affect, appropriate insight Skin: warm and dry, no rash or lesion on  limited exam    Assessment & Plan (Modelle Vollmer A. Britlyn Martine MD; 01/08/2018 7:18 PM)  BILIARY COLIC (Z50.15) Story: She does have right upper quadrant/epigastric pain radiating to the right shoulder blade classic for biliary colic and known gallstones based on prior ultrasound. Regurgitation of undigested food is not typical for biliary disease, and therefore it is possible that cholecystotomy will not totally relieve all of her symptoms. If that is the case would recommend gastric emptying study possible endoscopy. I recommend proceeding with laparoscopic cholecystectomy. Discussed risks of surgery including bleeding, pain, scarring, intraabdominal injury specifically to the common bile duct and sequelae, conversion to open surgery, failure to resolve symptoms, blood clots/ pulmonary embolus, heart attack, pneumonia, stroke, death. Questions welcomed and answered to patient's satisfaction. She is eager to proceed with surgery.

## 2018-01-13 NOTE — Addendum Note (Signed)
Encounter addended by: Freeman Caldron, PA-C on: 01/13/2018 4:53 PM  Actions taken: Delete clinical note

## 2018-01-15 DIAGNOSIS — R5381 Other malaise: Secondary | ICD-10-CM | POA: Diagnosis not present

## 2018-01-15 DIAGNOSIS — Z7401 Bed confinement status: Secondary | ICD-10-CM | POA: Diagnosis not present

## 2018-01-15 DIAGNOSIS — I959 Hypotension, unspecified: Secondary | ICD-10-CM | POA: Diagnosis not present

## 2018-01-15 DIAGNOSIS — Z79899 Other long term (current) drug therapy: Secondary | ICD-10-CM | POA: Diagnosis not present

## 2018-01-15 DIAGNOSIS — K8020 Calculus of gallbladder without cholecystitis without obstruction: Secondary | ICD-10-CM | POA: Diagnosis not present

## 2018-01-15 DIAGNOSIS — R197 Diarrhea, unspecified: Secondary | ICD-10-CM | POA: Diagnosis not present

## 2018-01-15 DIAGNOSIS — K8050 Calculus of bile duct without cholangitis or cholecystitis without obstruction: Secondary | ICD-10-CM | POA: Diagnosis not present

## 2018-01-15 DIAGNOSIS — R279 Unspecified lack of coordination: Secondary | ICD-10-CM | POA: Diagnosis not present

## 2018-01-15 DIAGNOSIS — R112 Nausea with vomiting, unspecified: Secondary | ICD-10-CM | POA: Diagnosis not present

## 2018-01-15 DIAGNOSIS — E039 Hypothyroidism, unspecified: Secondary | ICD-10-CM | POA: Diagnosis not present

## 2018-01-15 DIAGNOSIS — R1111 Vomiting without nausea: Secondary | ICD-10-CM | POA: Diagnosis not present

## 2018-01-15 DIAGNOSIS — R52 Pain, unspecified: Secondary | ICD-10-CM | POA: Diagnosis not present

## 2018-01-15 DIAGNOSIS — I10 Essential (primary) hypertension: Secondary | ICD-10-CM | POA: Diagnosis not present

## 2018-01-16 LAB — THYROGLOBULIN LEVEL: Thyroglobulin: >500 ng/mL — ABNORMAL HIGH

## 2018-01-19 ENCOUNTER — Ambulatory Visit (INDEPENDENT_AMBULATORY_CARE_PROVIDER_SITE_OTHER): Payer: Medicare Other | Admitting: General Surgery

## 2018-01-19 ENCOUNTER — Encounter: Payer: Self-pay | Admitting: General Surgery

## 2018-01-19 VITALS — BP 110/81 | HR 99 | Temp 96.9°F | Resp 16

## 2018-01-19 DIAGNOSIS — K8020 Calculus of gallbladder without cholecystitis without obstruction: Secondary | ICD-10-CM

## 2018-01-19 NOTE — Patient Instructions (Addendum)
Stop Xarelto after Friday's dose.   Cholelithiasis Cholelithiasis is a form of gallbladder disease in which gallstones form in the gallbladder. The gallbladder is an organ that stores bile. Bile is made in the liver, and it helps to digest fats. Gallstones begin as small crystals and slowly grow into stones. They may cause no symptoms until the gallbladder tightens (contracts) and a gallstone is blocking the duct (gallbladder attack), which can cause pain. Cholelithiasis is also referred to as gallstones. There are two main types of gallstones:  Cholesterol stones. These are made of hardened cholesterol and are usually yellow-green in color. They are the most common type of gallstone. Cholesterol is a white, waxy, fat-like substance that is made in the liver.  Pigment stones. These are dark in color and are made of a red-yellow substance that forms when hemoglobin from red blood cells breaks down (bilirubin).  What are the causes? This condition may be caused by an imbalance in the substances that bile is made of. This can happen if the bile:  Has too much bilirubin.  Has too much cholesterol.  Does not have enough bile salts. These salts help the body absorb and digest fats.  In some cases, this condition can also be caused by the gallbladder not emptying completely or often enough. What increases the risk? The following factors may make you more likely to develop this condition:  Being female.  Having multiple pregnancies. Health care providers sometimes advise removing diseased gallbladders before future pregnancies.  Eating a diet that is heavy in fried foods, fat, and refined carbohydrates, like white bread and white rice.  Being obese.  Being older than age 74.  Prolonged use of medicines that contain female hormones (estrogen).  Having diabetes mellitus.  Rapidly losing weight.  Having a family history of gallstones.  Being of [place] or [place]  descent.  Having an intestinal disease such as Crohn disease.  Having metabolic syndrome.  Having cirrhosis.  Having severe types of anemia such as sickle cell anemia.  What are the signs or symptoms? In most cases, there are no symptoms. These are known as silent gallstones. If a gallstone blocks the bile ducts, it can cause a gallbladder attack. The main symptom of a gallbladder attack is sudden pain in the upper right abdomen. The pain usually comes at night or after eating a large meal. The pain can last for one or several hours and can spread to the right shoulder or chest. If the bile duct is blocked for more than a few hours, it can cause infection or inflammation of the gallbladder, liver, or pancreas, which may cause:  Nausea.  Vomiting.  Abdominal pain that lasts for 5 hours or more.  Fever or chills.  Yellowing of the skin or the whites of the eyes (jaundice).  Dark urine.  Light-colored stools.  How is this diagnosed? This condition may be diagnosed based on:  A physical exam.  Your medical history.  An ultrasound of your gallbladder.  CT scan.  MRI.  Blood tests to check for signs of infection or inflammation.  A scan of your gallbladder and bile ducts (biliary system) using nonharmful radioactive material and special cameras that can see the radioactive material (cholescintigram). This test checks to see how your gallbladder contracts and whether bile ducts are blocked.  Inserting a small tube with a camera on the end (endoscope) through your mouth to inspect bile ducts and check for blockages (endoscopic retrograde cholangiopancreatogram).  How is this  treated? Treatment for gallstones depends on the severity of the condition. Silent gallstones do not need treatment. If the gallstones cause a gallbladder attack or other symptoms, treatment may be required. Options for treatment include:  Surgery to remove the gallbladder (cholecystectomy). This is the  most common treatment.  Medicines to dissolve gallstones. These are most effective at treating small gallstones. You may need to take medicines for up to 6-12 months.  Shock wave treatment (extracorporeal biliary lithotripsy). In this treatment, an ultrasound machine sends shock waves to the gallbladder to break gallstones into smaller pieces. These pieces can then be passed into the intestines or be dissolved by medicine. This is rarely used.  Removing gallstones through endoscopic retrograde cholangiopancreatogram. A small basket can be attached to the endoscope and used to capture and remove gallstones.  Follow these instructions at home:  Take over-the-counter and prescription medicines only as told by your health care provider.  Maintain a healthy weight and follow a healthy diet. This includes: ? Reducing fatty foods, such as fried food. ? Reducing refined carbohydrates, like white bread and white rice. ? Increasing fiber. Aim for foods like almonds, fruit, and beans.  Keep all follow-up visits as told by your health care provider. This is important. Contact a health care provider if:  You think you have had a gallbladder attack.  You have been diagnosed with silent gallstones and you develop abdominal pain or indigestion. Get help right away if:  You have pain from a gallbladder attack that lasts for more than 2 hours.  You have abdominal pain that lasts for more than 5 hours.  You have a fever or chills.  You have persistent nausea and vomiting.  You develop jaundice.  You have dark urine or light-colored stools. Summary  Cholelithiasis (also called gallstones) is a form of gallbladder disease in which gallstones form in the gallbladder.  This condition is caused by an imbalance in the substances that make up bile. This can happen if the bile has too much cholesterol, too much bilirubin, or not enough bile salts.  You are more likely to develop this condition if you  are female, pregnant, using medicines with estrogen, obese, older than age 44, or have a family history of gallstones. You may also develop gallstones if you have diabetes, an intestinal disease, cirrhosis, or metabolic syndrome.  Treatment for gallstones depends on the severity of the condition. Silent gallstones do not need treatment.  If gallstones cause a gallbladder attack or other symptoms, treatment may be needed. The most common treatment is surgery to remove the gallbladder. This information is not intended to replace advice given to you by your health care provider. Make sure you discuss any questions you have with your health care provider. Document Released: 03/06/2005 Document Revised: 11/25/2015 Document Reviewed: 11/25/2015 Elsevier Interactive Patient Education  2018 [employer].   Laparoscopic Cholecystectomy Laparoscopic cholecystectomy is surgery to remove the gallbladder. The gallbladder is a pear-shaped organ that lies beneath the liver on the right side of the body. The gallbladder stores bile, which is a fluid that helps the body to digest fats. Cholecystectomy is often done for inflammation of the gallbladder (cholecystitis). This condition is usually caused by a buildup of gallstones (cholelithiasis) in the gallbladder. Gallstones can block the flow of bile, which can result in inflammation and pain. In severe cases, emergency surgery may be required. This procedure is done though small incisions in your abdomen (laparoscopic surgery). A thin scope with a camera (laparoscope) is  inserted through one incision. Thin surgical instruments are inserted through the other incisions. In some cases, a laparoscopic procedure may be turned into a type of surgery that is done through a larger incision (open surgery). Tell a health care provider about:  Any allergies you have.  All medicines you are taking, including vitamins, herbs, eye drops, creams, and over-the-counter  medicines.  Any problems you or family members have had with anesthetic medicines.  Any blood disorders you have.  Any surgeries you have had.  Any medical conditions you have.  Whether you are pregnant or may be pregnant. What are the risks? Generally, this is a safe procedure. However, problems may occur, including:  Infection.  Bleeding.  Allergic reactions to medicines.  Damage to other structures or organs.  A stone remaining in the common bile duct. The common bile duct carries bile from the gallbladder into the small intestine.  A bile leak from the cyst duct that is clipped when your gallbladder is removed.  Medicines  Ask your health care provider about: ? Changing or stopping your regular medicines. This is especially important if you are taking diabetes medicines or blood thinners. ? Taking medicines such as aspirin and ibuprofen. These medicines can thin your blood. Do not take these medicines before your procedure if your health care provider instructs you not to.  You may be given antibiotic medicine to help prevent infection. General instructions  Let your health care provider know if you develop a cold or an infection before surgery.  Plan to have someone take you home from the hospital or clinic.  Ask your health care provider how your surgical site will be marked or identified. What happens during the procedure?  To reduce your risk of infection: ? Your health care team will wash or sanitize their hands. ? Your skin will be washed with soap. ? Hair may be removed from the surgical area.  An IV tube may be inserted into one of your veins.  You will be given one or more of the following: ? A medicine to help you relax (sedative). ? A medicine to make you fall asleep (general anesthetic).  A breathing tube will be placed in your mouth.  Your surgeon will make several small cuts (incisions) in your abdomen.  The laparoscope will be inserted  through one of the small incisions. The camera on the laparoscope will send images to a TV screen (monitor) in the operating room. This lets your surgeon see inside your abdomen.  Air-like gas will be pumped into your abdomen. This will expand your abdomen to give the surgeon more room to perform the surgery.  Other tools that are needed for the procedure will be inserted through the other incisions. The gallbladder will be removed through one of the incisions.  Your common bile duct may be examined. If stones are found in the common bile duct, they may be removed.  After your gallbladder has been removed, the incisions will be closed with stitches (sutures), staples, or skin glue.  Your incisions may be covered with a bandage (dressing). The procedure may vary among health care providers and hospitals. What happens after the procedure?  Your blood pressure, heart rate, breathing rate, and blood oxygen level will be monitored until the medicines you were given have worn off.  You will be given medicines as needed to control your pain.  Do not drive for 24 hours if you were given a sedative. This information is not intended to  replace advice given to you by your health care provider. Make sure you discuss any questions you have with your health care provider. Document Released: 03/10/2005 Document Revised: 09/30/2015 Document Reviewed: 08/27/2015 Elsevier Interactive Patient Education  2018 [employer].

## 2018-01-19 NOTE — Progress Notes (Addendum)
[facility] History and Physical  Reason for Referral:Gallstones  Referring Physician: Dr. Terressa Koyanagi Sarah [name] is a 52 y.o. female.  HPI: [name] is a 52 yo with a known history of RUQ pain, nausea/ vomiting who has been having these issues for over a year but they have been getting worse.  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 She reports that she now at the [facility] and has been doing some rehabilitation but this is limited due to weakness.  She is not able to eat much and has nausea/vomiting with every meal, and feels that this is keeping her from getting back her strength. She is hopeful that the paraplegia that resulted after her T3 spine surgery and radiation will improve as it has in the past.  She also has incontinence of bowel and bladder related to these treatments.     Past Medical History:  Diagnosis Date  . 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 Chronic back pain   . DDD (degenerative disc disease), cervical   . DJD (degenerative joint disease)   . Family history of adverse reaction to anesthesia    MOTHER HAS NAUSEA   . History of rectal fissure   . HIT (heparin-induced thrombocytopenia) ([place])   . Hypertension   . Obesity   . 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 Pulmonary embolus, right ([place]) 2015  . Rotator cuff tendonitis right    Past Surgical History:  Procedure Laterality Date  . FEMUR IM NAIL Left 09/11/2017   Procedure: INTRAMEDULLARY (IM) RETROGRADE FEMORAL NAILING;  Surgeon: Altamese White Shield, MD;  Location: [place];  Service: Orthopedics;  Laterality: Left;  . LAMINECTOMY N/A 12/14/2013   Procedure: THORACIC LAMINECTOMY FOR TUMOR THORACIC THREE;  Surgeon: Ashok Pall, MD;  Location: [place]  NEURO ORS;  Service: Neurosurgery;  Laterality: N/A;  THORACIC LAMINECTOMY FOR TUMOR THORACIC THREE  . LAMINECTOMY N/A 07/05/2015   Procedure: THORACIC LAMINECTOMY FOR TUMOR;  Surgeon: Ashok Pall, MD;  Location: [place] NEURO ORS;  Service: Neurosurgery;  Laterality: N/A;  . LAMINECTOMY N/A 09/03/2017   Procedure: POSTERIOR SPINAL TUMOR RESECTION THORACIC THREE;  Surgeon: Ashok Pall, MD;  Location: [place];  Service: Neurosurgery;  Laterality: N/A;  POSTERIOR SPINAL TUMOR RESECTION THORACIC THREE  . POSTERIOR LUMBAR FUSION 4 LEVEL N/A 12/30/2013   Procedure: Thoracic one-Thoracic five posterior thoracic fusion with pedicle screws;  Surgeon: Ashok Pall, MD;  Location: [place] NEURO ORS;  Service: Neurosurgery;  Laterality: N/A;  Thoracic one-Thoracic five posterior thoracic fusion with pedicle screws  . THYROIDECTOMY N/A 01/09/2014   Procedure: TOTAL THYROIDECTOMY;  Surgeon: Izora Gala, MD;  Location: [place];  Service: ENT;  Laterality: N/A;  . TONSILLECTOMY      Family History  Problem Relation Age of Onset  . Hypertension Mother   . Diabetes Mother   . Hypertension Father   . Stroke Father   . Diabetes Father   . Diabetes Sister   . Diabetes Sister     Social History   Tobacco Use  . Smoking status: Never Smoker  . Smokeless tobacco: Never Used  Substance Use Topics  . Alcohol use: No  . Drug use: No    Medications: I have reviewed  the patient's current medications. Allergies as of 01/19/2018      Reactions   Bee Venom Anaphylaxis   Keflex [cephalexin] Nausea And Vomiting   Tramadol Nausea And Vomiting   Gabapentin Nausea And Vomiting   Heparin Other (See Comments)   Weak positive platelets induced antibodies, SRA negative--2015   Hydrochlorothiazide Nausea And Vomiting   Hydrocodone Nausea And Vomiting   Oxycodone-acetaminophen Nausea And Vomiting   Sorbitol Nausea Only   And abdominal distress   Tizanidine    Fatigue      Medication List        Accurate as of 01/19/18  12:34 PM. Always use your most recent med list.          acetaminophen 325 MG tablet Commonly known as:  TYLENOL Take 2 tablets (650 mg total) by mouth every 4 (four) hours as needed for mild pain ((score 1 to 3) or temp > 100.5).   baclofen 20 MG tablet Commonly known as:  LIORESAL Take 1 tablet (20 mg total) by mouth 3 (three) times daily.   bisacodyl 10 MG suppository Commonly known as:  DULCOLAX Place 1 suppository (10 mg total) rectally at bedtime.   famotidine 20 MG tablet Commonly known as:  PEPCID Take 1 tablet (20 mg total) by mouth 2 (two) times daily.   fluticasone 50 MCG/ACT nasal spray Commonly known as:  FLONASE Place 2 sprays into both nostrils daily.   levothyroxine 137 MCG tablet Commonly known as:  SYNTHROID, LEVOTHROID Take 137 mcg by mouth daily before breakfast.   loratadine 10 MG tablet Commonly known as:  CLARITIN Take 1 tablet (10 mg total) by mouth daily.   ondansetron 4 MG tablet Commonly known as:  ZOFRAN Take 1 tablet by mouth every 6 (six) hours as needed.   pantoprazole 40 MG tablet Commonly known as:  PROTONIX Take 1 tablet by mouth daily.   polyethylene glycol packet Commonly known as:  MIRALAX / GLYCOLAX Take 17 g by mouth daily.   rivaroxaban 20 MG Tabs tablet Commonly known as:  XARELTO Take 20 mg by mouth daily with supper.   senna 8.6 MG Tabs tablet Commonly known as:  SENOKOT Take 1 tablet (8.6 mg total) by mouth 2 (two) times daily.   sertraline 25 MG tablet Commonly known as:  ZOLOFT Take 1 tablet (25 mg total) by mouth daily.   sodium phosphate 7-19 GM/118ML Enem Place 133 mLs (1 enema total) rectally daily as needed for severe constipation.   sucralfate 1 GM/10ML suspension Commonly known as:  CARAFATE Take 10 mLs (1 g total) by mouth 4 (four) times daily -  with meals and at bedtime.   sucralfate 1 g tablet Commonly known as:  CARAFATE Take 1 tablet (1 g total) by mouth 4 (four) times daily -  with meals and  at bedtime. Please dissolve tablet in 10-15 mL of water to create a slurry. Patient should drink 10-15 minutes before each meals and at bedtime to reduce painful swallowing.        ROS:  A comprehensive review of systems was negative except for: Respiratory: positive for SOB with allergies Gastrointestinal: positive for abdominal pain, nausea, reflux symptoms and vomiting Genitourinary: positive for urinary incontinence and bowel incontinence Hematologic/lymphatic: positive for h/o DVT/PE On Xarelto Endocrine: positive for tired/ sluggish/ cold intolerance  Blood pressure 110/81, pulse 99, temperature (!) 96.9 F (36.1 C), temperature source Temporal, resp. rate 16, last menstrual period 10/22/2016. Physical Exam  Constitutional: She is oriented to person,  place, and time. She appears well-developed and well-nourished.  HENT:  Head: Normocephalic.  Eyes: Pupils are equal, round, and reactive to light. EOM are normal.  Neck: Normal range of motion.  Cardiovascular: Normal rate and regular rhythm.  Pulmonary/Chest: Effort normal and breath sounds normal.  Abdominal: Soft. She exhibits no distension and no mass. There is tenderness in the right upper quadrant and epigastric area. No hernia.  Musculoskeletal: She exhibits no edema.  paraplegic  Neurological: She is alert and oriented to person, place, and time.  Skin: Skin is warm and dry.  Psychiatric: She has a normal mood and affect. Her behavior is normal. Judgment and thought content normal.  Vitals reviewed.   Results: [place] 08/2017 IMPRESSION: Cholelithiasis without complicating factors. Changes consistent with fatty infiltration of the liver.  HIDA 12/2017 IMPRESSION: 1. Patent cystic duct without evidence for acute cholecystitis. 2. Normal gallbladder ejection fraction.  Assessment & Plan:  Sarah [name] is a 52 y.o. female with cholelithiasis and biliary colic symptoms. She has been having worsening symptoms and is  ready to get the gallbladder removed asap.   -Lap chole 01/25/2018 -Hold Xarelto on Friday prior to Surgery (01/22/2018) -She has never had issues with anesthesia per her report after her neck radiation, she was intubated fine for her back surgeries this summer and no indication of issues in the anesthesia post op notes   PLAN: I counseled the patient about the indication, risks and benefits of laparoscopic cholecystectomy.  She understands there is a very small chance for bleeding, infection, injury to normal structures (including common bile duct), conversion to open surgery, persistent symptoms, evolution of postcholecystectomy diarrhea, need for secondary interventions, anesthesia reaction, cardiopulmonary issues and other risks not specifically detailed here. I described the expected recovery, the plan for follow-up and the restrictions during the recovery phase.  All questions were answered.  All questions were answered to the satisfaction of the patient and family.   Sarah [name] 01/19/2018, 12:34 PM

## 2018-01-20 DIAGNOSIS — C73 Malignant neoplasm of thyroid gland: Secondary | ICD-10-CM | POA: Diagnosis not present

## 2018-01-20 DIAGNOSIS — I10 Essential (primary) hypertension: Secondary | ICD-10-CM | POA: Diagnosis not present

## 2018-01-20 DIAGNOSIS — R1011 Right upper quadrant pain: Secondary | ICD-10-CM | POA: Diagnosis not present

## 2018-01-20 DIAGNOSIS — G8100 Flaccid hemiplegia affecting unspecified side: Secondary | ICD-10-CM | POA: Diagnosis not present

## 2018-01-20 NOTE — H&P (Signed)
[facility] History and Physical  Reason for Referral:Gallstones  Referring Physician: Dr. Terressa Koyanagi Sarah [name] is a 52 y.o. female.  HPI: Sarah [name] is a 52 yo with a known history of RUQ pain, nausea/ vomiting who has been having these issues for over a year but they have been getting worse.  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 She reports that she now at the [facility] and has been doing some rehabilitation but this is limited due to weakness.  She is not able to eat much and has nausea/vomiting with every meal, and feels that this is keeping her from getting back her strength. She is hopeful that the paraplegia that resulted after her T3 spine surgery and radiation will improve as it has in the past.  She also has incontinence of bowel and bladder related to these treatments.         Past Medical History:  Diagnosis Date  . 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 Chronic back pain   . DDD (degenerative disc disease), cervical   . DJD (degenerative joint disease)   . Family history of adverse reaction to anesthesia    MOTHER HAS NAUSEA   . History of rectal fissure   . HIT (heparin-induced thrombocytopenia) ([place])   . Hypertension   . Obesity   . 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 Pulmonary embolus, right ([place]) 2015  . Rotator cuff tendonitis right         Past Surgical History:  Procedure Laterality Date  . FEMUR IM NAIL Left 09/11/2017   Procedure: INTRAMEDULLARY (IM) RETROGRADE FEMORAL NAILING;  Surgeon: Altamese Florence, MD;  Location: [place];  Service: Orthopedics;  Laterality: Left;  . LAMINECTOMY N/A 12/14/2013   Procedure: THORACIC LAMINECTOMY FOR TUMOR THORACIC THREE;  Surgeon:  Ashok Pall, MD;  Location: [place] NEURO ORS;  Service: Neurosurgery;  Laterality: N/A;  THORACIC LAMINECTOMY FOR TUMOR THORACIC THREE  . LAMINECTOMY N/A 07/05/2015   Procedure: THORACIC LAMINECTOMY FOR TUMOR;  Surgeon: Ashok Pall, MD;  Location: [place] NEURO ORS;  Service: Neurosurgery;  Laterality: N/A;  . LAMINECTOMY N/A 09/03/2017   Procedure: POSTERIOR SPINAL TUMOR RESECTION THORACIC THREE;  Surgeon: Ashok Pall, MD;  Location: [place];  Service: Neurosurgery;  Laterality: N/A;  POSTERIOR SPINAL TUMOR RESECTION THORACIC THREE  . POSTERIOR LUMBAR FUSION 4 LEVEL N/A 12/30/2013   Procedure: Thoracic one-Thoracic five posterior thoracic fusion with pedicle screws;  Surgeon: Ashok Pall, MD;  Location: [place] NEURO ORS;  Service: Neurosurgery;  Laterality: N/A;  Thoracic one-Thoracic five posterior thoracic fusion with pedicle screws  . THYROIDECTOMY N/A 01/09/2014   Procedure: TOTAL THYROIDECTOMY;  Surgeon: Izora Gala, MD;  Location: [place];  Service: ENT;  Laterality: N/A;  . TONSILLECTOMY           Family History  Problem Relation Age of Onset  . Hypertension Mother   . Diabetes Mother   . Hypertension Father   . Stroke Father   . Diabetes Father   . Diabetes Sister   . Diabetes Sister     Social History       Tobacco Use  . Smoking status: Never Smoker  . Smokeless tobacco: Never Used  Substance Use Topics  .  Alcohol use: No  . Drug use: No    Medications: I have reviewed the patient's current medications.      Allergies as of 01/19/2018      Reactions   Bee Venom Anaphylaxis   Keflex [cephalexin] Nausea And Vomiting   Tramadol Nausea And Vomiting   Gabapentin Nausea And Vomiting   Heparin Other (See Comments)   Weak positive platelets induced antibodies, SRA negative--2015   Hydrochlorothiazide Nausea And Vomiting   Hydrocodone Nausea And Vomiting   Oxycodone-acetaminophen Nausea And Vomiting   Sorbitol Nausea Only   And abdominal distress     Tizanidine    Fatigue               Medication List        Accurate as of 01/19/18 12:34 PM. Always use your most recent med list.           acetaminophen 325 MG tablet Commonly known as:  TYLENOL Take 2 tablets (650 mg total) by mouth every 4 (four) hours as needed for mild pain ((score 1 to 3) or temp > 100.5).   baclofen 20 MG tablet Commonly known as:  LIORESAL Take 1 tablet (20 mg total) by mouth 3 (three) times daily.   bisacodyl 10 MG suppository Commonly known as:  DULCOLAX Place 1 suppository (10 mg total) rectally at bedtime.   famotidine 20 MG tablet Commonly known as:  PEPCID Take 1 tablet (20 mg total) by mouth 2 (two) times daily.   fluticasone 50 MCG/ACT nasal spray Commonly known as:  FLONASE Place 2 sprays into both nostrils daily.   levothyroxine 137 MCG tablet Commonly known as:  SYNTHROID, LEVOTHROID Take 137 mcg by mouth daily before breakfast.   loratadine 10 MG tablet Commonly known as:  CLARITIN Take 1 tablet (10 mg total) by mouth daily.   ondansetron 4 MG tablet Commonly known as:  ZOFRAN Take 1 tablet by mouth every 6 (six) hours as needed.   pantoprazole 40 MG tablet Commonly known as:  PROTONIX Take 1 tablet by mouth daily.   polyethylene glycol packet Commonly known as:  MIRALAX / GLYCOLAX Take 17 g by mouth daily.   rivaroxaban 20 MG Tabs tablet Commonly known as:  XARELTO Take 20 mg by mouth daily with supper.   senna 8.6 MG Tabs tablet Commonly known as:  SENOKOT Take 1 tablet (8.6 mg total) by mouth 2 (two) times daily.   sertraline 25 MG tablet Commonly known as:  ZOLOFT Take 1 tablet (25 mg total) by mouth daily.   sodium phosphate 7-19 GM/118ML Enem Place 133 mLs (1 enema total) rectally daily as needed for severe constipation.   sucralfate 1 GM/10ML suspension Commonly known as:  CARAFATE Take 10 mLs (1 g total) by mouth 4 (four) times daily -  with meals and at bedtime.    sucralfate 1 g tablet Commonly known as:  CARAFATE Take 1 tablet (1 g total) by mouth 4 (four) times daily -  with meals and at bedtime. Please dissolve tablet in 10-15 mL of water to create a slurry. Patient should drink 10-15 minutes before each meals and at bedtime to reduce painful swallowing.        ROS:  A comprehensive review of systems was negative except for: Respiratory: positive for SOB with allergies Gastrointestinal: positive for abdominal pain, nausea, reflux symptoms and vomiting Genitourinary: positive for urinary incontinence and bowel incontinence Hematologic/lymphatic: positive for h/o DVT/PE On Xarelto Endocrine: positive for tired/ sluggish/ cold intolerance  Blood pressure 110/81, pulse 99, temperature (!) 96.9 F (36.1 C), temperature source Temporal, resp. rate 16, last menstrual period 10/22/2016. Physical Exam  Constitutional: She is oriented to person, place, and time. She appears well-developed and well-nourished.  HENT:  Head: Normocephalic.  Eyes: Pupils are equal, round, and reactive to light. EOM are normal.  Neck: Normal range of motion.  Cardiovascular: Normal rate and regular rhythm.  Pulmonary/Chest: Effort normal and breath sounds normal.  Abdominal: Soft. She exhibits no distension and no mass. There is tenderness in the right upper quadrant and epigastric area. No hernia.  Musculoskeletal: She exhibits no edema.  paraplegic  Neurological: She is alert and oriented to person, place, and time.  Skin: Skin is warm and dry.  Psychiatric: She has a normal mood and affect. Her behavior is normal. Judgment and thought content normal.  Vitals reviewed.   Results: [place] 08/2017 IMPRESSION: Cholelithiasis without complicating factors. Changes consistent with fatty infiltration of the liver.  HIDA 12/2017 IMPRESSION: 1. Patent cystic duct without evidence for acute cholecystitis. 2. Normal gallbladder ejection fraction.  Assessment &  Plan:  Sarah [name] is a 52 y.o. female with cholelithiasis and biliary colic symptoms. She has been having worsening symptoms and is ready to get the gallbladder removed asap.   -Lap chole 01/25/2018 -Hold Xarelto on Friday prior to Surgery (01/22/2018) -She has never had issues with anesthesia per her report after her neck radiation, she was intubated fine for her back surgeries this summer and no indication of issues in the anesthesia post op notes   PLAN: I counseled the patient about the indication, risks and benefits of laparoscopic cholecystectomy.  She understands there is a very small chance for bleeding, infection, injury to normal structures (including common bile duct), conversion to open surgery, persistent symptoms, evolution of postcholecystectomy diarrhea, need for secondary interventions, anesthesia reaction, cardiopulmonary issues and other risks not specifically detailed here. I described the expected recovery, the plan for follow-up and the restrictions during the recovery phase.  All questions were answered.  All questions were answered to the satisfaction of the patient and family.   Virl Cagey 01/19/2018, 12:34 PM

## 2018-01-21 ENCOUNTER — Encounter (HOSPITAL_COMMUNITY)
Admission: RE | Admit: 2018-01-21 | Discharge: 2018-01-21 | Disposition: A | Discharge status: Home / Self Care | Payer: Medicare Other | Source: Ambulatory Visit | Attending: General Surgery | Admitting: General Surgery

## 2018-01-21 ENCOUNTER — Encounter (HOSPITAL_COMMUNITY): Payer: Self-pay

## 2018-01-25 ENCOUNTER — Other Ambulatory Visit: Payer: Self-pay

## 2018-01-25 ENCOUNTER — Encounter (HOSPITAL_COMMUNITY): Payer: Self-pay | Admitting: *Deleted

## 2018-01-25 ENCOUNTER — Ambulatory Visit (HOSPITAL_COMMUNITY): Payer: Medicare Other | Admitting: Anesthesiology

## 2018-01-25 ENCOUNTER — Encounter (HOSPITAL_COMMUNITY)
Admission: RE | Disposition: A | Discharge status: Home / Self Care | Payer: Self-pay | Source: Ambulatory Visit | Attending: General Surgery

## 2018-01-25 ENCOUNTER — Ambulatory Visit (HOSPITAL_COMMUNITY)
Admission: RE | Admit: 2018-01-25 | Discharge: 2018-01-25 | Disposition: A | Discharge status: Home / Self Care | Payer: Medicare Other | Source: Ambulatory Visit | Attending: General Surgery | Admitting: General Surgery

## 2018-01-25 DIAGNOSIS — F329 Major depressive disorder, single episode, unspecified: Secondary | ICD-10-CM | POA: Diagnosis not present

## 2018-01-25 DIAGNOSIS — K8010 Calculus of gallbladder with chronic cholecystitis without obstruction: Secondary | ICD-10-CM | POA: Diagnosis not present

## 2018-01-25 DIAGNOSIS — Z85850 Personal history of malignant neoplasm of thyroid: Secondary | ICD-10-CM | POA: Insufficient documentation

## 2018-01-25 DIAGNOSIS — E039 Hypothyroidism, unspecified: Secondary | ICD-10-CM | POA: Diagnosis not present

## 2018-01-25 DIAGNOSIS — Z86711 Personal history of pulmonary embolism: Secondary | ICD-10-CM | POA: Diagnosis not present

## 2018-01-25 DIAGNOSIS — Z01810 Encounter for preprocedural cardiovascular examination: Secondary | ICD-10-CM

## 2018-01-25 DIAGNOSIS — Z79899 Other long term (current) drug therapy: Secondary | ICD-10-CM | POA: Insufficient documentation

## 2018-01-25 DIAGNOSIS — G8220 Paraplegia, unspecified: Secondary | ICD-10-CM | POA: Diagnosis not present

## 2018-01-25 DIAGNOSIS — Z7951 Long term (current) use of inhaled steroids: Secondary | ICD-10-CM | POA: Diagnosis not present

## 2018-01-25 DIAGNOSIS — Z79890 Hormone replacement therapy (postmenopausal): Secondary | ICD-10-CM | POA: Diagnosis not present

## 2018-01-25 DIAGNOSIS — Z86718 Personal history of other venous thrombosis and embolism: Secondary | ICD-10-CM | POA: Insufficient documentation

## 2018-01-25 DIAGNOSIS — K8080 Other cholelithiasis without obstruction: Secondary | ICD-10-CM | POA: Diagnosis present

## 2018-01-25 DIAGNOSIS — K219 Gastro-esophageal reflux disease without esophagitis: Secondary | ICD-10-CM | POA: Insufficient documentation

## 2018-01-25 DIAGNOSIS — K8020 Calculus of gallbladder without cholecystitis without obstruction: Secondary | ICD-10-CM | POA: Diagnosis not present

## 2018-01-25 DIAGNOSIS — Z7901 Long term (current) use of anticoagulants: Secondary | ICD-10-CM | POA: Insufficient documentation

## 2018-01-25 DIAGNOSIS — I10 Essential (primary) hypertension: Secondary | ICD-10-CM | POA: Insufficient documentation

## 2018-01-25 DIAGNOSIS — Z923 Personal history of irradiation: Secondary | ICD-10-CM | POA: Insufficient documentation

## 2018-01-25 HISTORY — DX: Hypothyroidism, unspecified: E03.9

## 2018-01-25 HISTORY — DX: Other complications of anesthesia, initial encounter: T88.59XA

## 2018-01-25 HISTORY — DX: Adverse effect of unspecified anesthetic, initial encounter: T41.45XA

## 2018-01-25 HISTORY — DX: Gastro-esophageal reflux disease without esophagitis: K21.9

## 2018-01-25 HISTORY — PX: CHOLECYSTECTOMY: SHX55

## 2018-01-25 SURGERY — LAPAROSCOPIC CHOLECYSTECTOMY
Anesthesia: General | Site: Abdomen

## 2018-01-25 MED ORDER — MEPERIDINE HCL 50 MG/ML IJ SOLN: 6.2500 mg | INTRAMUSCULAR | Status: DC | PRN

## 2018-01-25 MED ORDER — EPHEDRINE SULFATE-NACL 50-0.9 MG/10ML-% IV SOSY
PREFILLED_SYRINGE | INTRAVENOUS | Status: DC | PRN
  Administered 2018-01-25: 20 mg via INTRAVENOUS

## 2018-01-25 MED ORDER — LIDOCAINE HCL (PF) 1 % IJ SOLN
INTRAMUSCULAR | Status: AC
  Filled 2018-01-25: qty 5

## 2018-01-25 MED ORDER — ROCURONIUM BROMIDE 50 MG/5ML IV SOLN
INTRAVENOUS | Status: AC
  Filled 2018-01-25: qty 1

## 2018-01-25 MED ORDER — CEFAZOLIN SODIUM-DEXTROSE 2-4 GM/100ML-% IV SOLN: 2.0000 g | INTRAVENOUS | Status: DC

## 2018-01-25 MED ORDER — BUPIVACAINE HCL (PF) 0.5 % IJ SOLN
INTRAMUSCULAR | Status: DC | PRN
  Administered 2018-01-25: 10 mL

## 2018-01-25 MED ORDER — CHLORHEXIDINE GLUCONATE CLOTH 2 % EX PADS: 6.0000 | MEDICATED_PAD | Freq: Once | CUTANEOUS | Status: DC

## 2018-01-25 MED ORDER — BUPIVACAINE LIPOSOME 1.3 % IJ SUSP: 20.0000 mL | Freq: Once | INTRAMUSCULAR | Status: DC

## 2018-01-25 MED ORDER — CIPROFLOXACIN IN D5W 400 MG/200ML IV SOLN
400.0000 mg | INTRAVENOUS | Status: AC
  Administered 2018-01-25: 400 mg via INTRAVENOUS

## 2018-01-25 MED ORDER — SUGAMMADEX SODIUM 200 MG/2ML IV SOLN
INTRAVENOUS | Status: DC | PRN
  Administered 2018-01-25: 200 mg via INTRAVENOUS

## 2018-01-25 MED ORDER — SEVOFLURANE IN SOLN
RESPIRATORY_TRACT | Status: AC
  Filled 2018-01-25: qty 250

## 2018-01-25 MED ORDER — HEMOSTATIC AGENTS (NO CHARGE) OPTIME
TOPICAL | Status: DC | PRN
  Administered 2018-01-25 (×2): 1 via TOPICAL

## 2018-01-25 MED ORDER — CHLORHEXIDINE GLUCONATE 4 % EX LIQD: 60.0000 mL | Freq: Once | CUTANEOUS | Status: DC

## 2018-01-25 MED ORDER — 0.9 % SODIUM CHLORIDE (POUR BTL) OPTIME
TOPICAL | Status: DC | PRN
  Administered 2018-01-25: 1000 mL

## 2018-01-25 MED ORDER — KETOROLAC TROMETHAMINE 30 MG/ML IJ SOLN: 30.0000 mg | Freq: Once | INTRAMUSCULAR | Status: DC | PRN

## 2018-01-25 MED ORDER — HYDROCODONE-ACETAMINOPHEN 7.5-325 MG PO TABS: 1.0000 | ORAL_TABLET | Freq: Once | ORAL | Status: DC | PRN

## 2018-01-25 MED ORDER — DEXAMETHASONE SODIUM PHOSPHATE 4 MG/ML IJ SOLN
INTRAMUSCULAR | Status: DC | PRN
  Administered 2018-01-25: 4 mg via INTRAVENOUS

## 2018-01-25 MED ORDER — CIPROFLOXACIN IN D5W 400 MG/200ML IV SOLN
INTRAVENOUS | Status: AC
  Filled 2018-01-25: qty 200

## 2018-01-25 MED ORDER — ONDANSETRON HCL 4 MG/2ML IJ SOLN
INTRAMUSCULAR | Status: AC
  Filled 2018-01-25: qty 2

## 2018-01-25 MED ORDER — SUGAMMADEX SODIUM 200 MG/2ML IV SOLN
INTRAVENOUS | Status: AC
  Filled 2018-01-25: qty 2

## 2018-01-25 MED ORDER — ROCURONIUM BROMIDE 100 MG/10ML IV SOLN
INTRAVENOUS | Status: DC | PRN
  Administered 2018-01-25: 40 mg via INTRAVENOUS

## 2018-01-25 MED ORDER — ONDANSETRON HCL 4 MG/2ML IJ SOLN: 4.0000 mg | Freq: Once | INTRAMUSCULAR | Status: DC | PRN

## 2018-01-25 MED ORDER — CEFAZOLIN SODIUM-DEXTROSE 2-4 GM/100ML-% IV SOLN
INTRAVENOUS | Status: AC
  Filled 2018-01-25: qty 100

## 2018-01-25 MED ORDER — MIDAZOLAM HCL 2 MG/2ML IJ SOLN
INTRAMUSCULAR | Status: AC
  Filled 2018-01-25: qty 2

## 2018-01-25 MED ORDER — HYDROMORPHONE HCL 1 MG/ML IJ SOLN: 0.2500 mg | INTRAMUSCULAR | Status: DC | PRN

## 2018-01-25 MED ORDER — FENTANYL CITRATE (PF) 100 MCG/2ML IJ SOLN
INTRAMUSCULAR | Status: DC | PRN
  Administered 2018-01-25 (×4): 50 ug via INTRAVENOUS

## 2018-01-25 MED ORDER — BUPIVACAINE HCL (PF) 0.5 % IJ SOLN
INTRAMUSCULAR | Status: AC
  Filled 2018-01-25: qty 30

## 2018-01-25 MED ORDER — FENTANYL CITRATE (PF) 100 MCG/2ML IJ SOLN
INTRAMUSCULAR | Status: AC
  Filled 2018-01-25: qty 2

## 2018-01-25 MED ORDER — ACETAMINOPHEN 500 MG PO TABS: 1000.0000 mg | ORAL_TABLET | ORAL | Status: DC

## 2018-01-25 MED ORDER — LACTATED RINGERS IV SOLN
INTRAVENOUS | Status: DC
  Administered 2018-01-25: 11:00:00 via INTRAVENOUS

## 2018-01-25 MED ORDER — DEXAMETHASONE SODIUM PHOSPHATE 4 MG/ML IJ SOLN
INTRAMUSCULAR | Status: AC
  Filled 2018-01-25: qty 1

## 2018-01-25 MED ORDER — PROPOFOL 10 MG/ML IV BOLUS
INTRAVENOUS | Status: AC
  Filled 2018-01-25: qty 20

## 2018-01-25 MED ORDER — PROPOFOL 10 MG/ML IV BOLUS
INTRAVENOUS | Status: DC | PRN
  Administered 2018-01-25: 130 mg via INTRAVENOUS

## 2018-01-25 MED ORDER — ONDANSETRON HCL 4 MG/2ML IJ SOLN
INTRAMUSCULAR | Status: DC | PRN
  Administered 2018-01-25: 4 mg via INTRAVENOUS

## 2018-01-25 MED ORDER — MIDAZOLAM HCL 5 MG/5ML IJ SOLN
INTRAMUSCULAR | Status: DC | PRN
  Administered 2018-01-25: 2 mg via INTRAVENOUS

## 2018-01-25 MED ORDER — LIDOCAINE HCL 1 % IJ SOLN
INTRAMUSCULAR | Status: DC | PRN
  Administered 2018-01-25: 50 mg via INTRADERMAL

## 2018-01-25 MED ORDER — OXYCODONE HCL 5 MG PO TABS: 5.0000 mg | ORAL_TABLET | ORAL | 0 refills | Status: DC | PRN

## 2018-01-25 MED ORDER — ACETAMINOPHEN 500 MG PO TABS
ORAL_TABLET | ORAL | Status: AC
  Filled 2018-01-25: qty 2

## 2018-01-25 MED ORDER — EPHEDRINE SULFATE 50 MG/ML IJ SOLN
INTRAMUSCULAR | Status: AC
  Filled 2018-01-25: qty 1

## 2018-01-25 SURGICAL SUPPLY — 48 items
APPLICATOR ARISTA FLEXITIP XL (MISCELLANEOUS) ×3 IMPLANT
APPLIER CLIP ROT 10 11.4 M/L (STAPLE) ×3
BAG RETRIEVAL 10 (BASKET) ×1
BAG RETRIEVAL 10MM (BASKET) ×1
BLADE SURG 15 STRL LF DISP TIS (BLADE) ×1 IMPLANT
BLADE SURG 15 STRL SS (BLADE) ×2
CHLORAPREP W/TINT 26ML (MISCELLANEOUS) ×3 IMPLANT
CLIP APPLIE ROT 10 11.4 M/L (STAPLE) ×1 IMPLANT
CLOTH BEACON ORANGE TIMEOUT ST (SAFETY) ×3 IMPLANT
COVER LIGHT HANDLE STERIS (MISCELLANEOUS) ×6 IMPLANT
DECANTER SPIKE VIAL GLASS SM (MISCELLANEOUS) ×3 IMPLANT
DERMABOND ADVANCED (GAUZE/BANDAGES/DRESSINGS) ×2
DERMABOND ADVANCED .7 DNX12 (GAUZE/BANDAGES/DRESSINGS) ×1 IMPLANT
ELECT REM PT RETURN 9FT ADLT (ELECTROSURGICAL) ×3
ELECTRODE REM PT RTRN 9FT ADLT (ELECTROSURGICAL) ×1 IMPLANT
FILTER SMOKE EVAC LAPAROSHD (FILTER) ×3 IMPLANT
GLOVE BIO SURGEON STRL SZ 6.5 (GLOVE) ×2 IMPLANT
GLOVE BIO SURGEONS STRL SZ 6.5 (GLOVE) ×1
GLOVE BIOGEL PI IND STRL 6.5 (GLOVE) ×1 IMPLANT
GLOVE BIOGEL PI IND STRL 7.0 (GLOVE) ×3 IMPLANT
GLOVE BIOGEL PI INDICATOR 6.5 (GLOVE) ×2
GLOVE BIOGEL PI INDICATOR 7.0 (GLOVE) ×6
GLOVE ECLIPSE 6.5 STRL STRAW (GLOVE) ×3 IMPLANT
GOWN STRL REUS W/TWL LRG LVL3 (GOWN DISPOSABLE) ×9 IMPLANT
HEMOSTAT ARISTA ABSORB 3G PWDR (MISCELLANEOUS) ×3 IMPLANT
HEMOSTAT SNOW SURGICEL 2X4 (HEMOSTASIS) ×3 IMPLANT
INST SET LAPROSCOPIC AP (KITS) ×3 IMPLANT
IV NS IRRIG 3000ML ARTHROMATIC (IV SOLUTION) IMPLANT
KIT TURNOVER KIT A (KITS) ×3 IMPLANT
MANIFOLD NEPTUNE II (INSTRUMENTS) ×3 IMPLANT
NEEDLE INSUFFLATION 14GA 120MM (NEEDLE) ×3 IMPLANT
NS IRRIG 1000ML POUR BTL (IV SOLUTION) ×3 IMPLANT
PACK LAP CHOLE LZT030E (CUSTOM PROCEDURE TRAY) ×3 IMPLANT
PAD ARMBOARD 7.5X6 YLW CONV (MISCELLANEOUS) ×3 IMPLANT
SET BASIN LINEN APH (SET/KITS/TRAYS/PACK) ×3 IMPLANT
SET TUBE IRRIG SUCTION NO TIP (IRRIGATION / IRRIGATOR) IMPLANT
SLEEVE ENDOPATH XCEL 5M (ENDOMECHANICALS) ×3 IMPLANT
SUT MNCRL AB 4-0 PS2 18 (SUTURE) ×6 IMPLANT
SUT VICRYL 0 UR6 27IN ABS (SUTURE) ×3 IMPLANT
SYS BAG RETRIEVAL 10MM (BASKET) ×1
SYSTEM BAG RETRIEVAL 10MM (BASKET) ×1 IMPLANT
TROCAR ENDO BLADELESS 11MM (ENDOMECHANICALS) ×3 IMPLANT
TROCAR XCEL NON-BLD 5MMX100MML (ENDOMECHANICALS) ×3 IMPLANT
TROCAR XCEL UNIV SLVE 11M 100M (ENDOMECHANICALS) ×3 IMPLANT
TUBE CONNECTING 12'X1/4 (SUCTIONS) ×1
TUBE CONNECTING 12X1/4 (SUCTIONS) ×2 IMPLANT
TUBING INSUFFLATION (TUBING) ×3 IMPLANT
WARMER LAPAROSCOPE (MISCELLANEOUS) ×3 IMPLANT

## 2018-01-25 NOTE — Anesthesia Procedure Notes (Signed)
Procedure Name: Intubation Date/Time: 01/25/2018 10:47 AM Performed by: Talbot Grumbling, CRNA Pre-anesthesia Checklist: Patient identified, Emergency Drugs available, Suction available and Patient being monitored Patient Re-evaluated:Patient Re-evaluated prior to induction Oxygen Delivery Method: Circle system utilized Preoxygenation: Pre-oxygenation with 100% oxygen Induction Type: IV induction Ventilation: Mask ventilation without difficulty Laryngoscope Size: Mac and 3 Grade View: Grade II Tube type: Oral Tube size: 7.0 mm Number of attempts: 1 Airway Equipment and Method: Stylet Placement Confirmation: ETT inserted through vocal cords under direct vision,  positive ETCO2 and breath sounds checked- equal and bilateral Secured at: 21 cm Tube secured with: Tape Dental Injury: Teeth and Oropharynx as per pre-operative assessment

## 2018-01-25 NOTE — Op Note (Signed)
Operative Note   Preoperative Diagnosis: Symptomatic cholelithiasis   Postoperative Diagnosis: Same   Procedure(s) Performed: Laparoscopic cholecystectomy   Surgeon: Ria Comment C. Constance Haw, MD   Assistants: Aviva Signs, MD    Anesthesia: General endotracheal   Anesthesiologist: Nicanor Alcon, MD    Specimens: Gallbladder    Estimated Blood Loss: Minimal    Blood Replacement: None    Complications: None    Operative Findings: Distended gallbladder    Procedure: The patient was taken to the operating room and placed supine. General endotracheal anesthesia was induced. Intravenous antibiotics were administered per protocol. An orogastric tube positioned to decompress the stomach. The abdomen was prepared and draped in the usual sterile fashion.    A supraumbilical incision was made and a Veress technique was utilized to achieve pneumoperitoneum to 15 mmHg with carbon dioxide. A 11 mm optiview port was placed through the supraumbilical region, and a 10 mm 0-degree operative laparoscope was introduced. The area underlying the trocar and Veress needle were inspected and without evidence of injury.  Remaining trocars were placed under direct vision. Two 5 mm ports were placed in the right abdomen, between the anterior axillary and midclavicular line.  A final 11 mm port was placed through the mid-epigastrium, near the falciform ligament.    The gallbladder fundus was elevated cephalad and the infundibulum was retracted to the patient's right. The gallbladder/cystic duct junction was skeletonized. The cystic artery noted in the triangle of Calot and was also skeletonized.  We then continued liberal medial and lateral dissection until the critical view of safety was achieved.    The cystic duct was triply clipped and divided, and cystic artery was doubly clipped and divided. There appeared to be a small arterial branch tracking posteriorly and this was clipped at the hepatic bed. The gallbladder  was then dissected from the liver bed with electrocautery. The specimen was placed in an Endopouch and was retrieved through the epigastric site.   She had some oozing and cautery was used to control this as well as [place] and [employer] were placed in the gallbladder bed. Trocars were removed and pneumoperitoneum was released.  0 Vicryl fascial sutures was used to the umbilical port site and the epigastric site was overlying the rib. Skin incisions were closed with 4-0 Monocryl subcuticular sutures and Dermabond. The patient was awakened from anesthesia and extubated without complication.   Dr. Arnoldo Morale assisted with the case.    Curlene Labrum, MD [facility] [address] [place], [place] [address] [phone] (office)

## 2018-01-25 NOTE — Anesthesia Postprocedure Evaluation (Signed)
Anesthesia Post Note  Patient: FAELYN SIGLER  Procedure(s) Performed: LAPAROSCOPIC CHOLECYSTECTOMY (N/A Abdomen)  Patient location during evaluation: PACU Anesthesia Type: General Level of consciousness: awake and alert Pain management: pain level controlled Vital Signs Assessment: post-procedure vital signs reviewed and stable Respiratory status: spontaneous breathing Cardiovascular status: blood pressure returned to baseline Postop Assessment: no apparent nausea or vomiting Anesthetic complications: no     Last Vitals:  Vitals:   01/25/18 1200 01/25/18 1215  BP: (!) 160/88 (!) 149/83  Pulse: 86 87  Resp: 11 11  Temp:    SpO2: 97% 97%    Last Pain:  Vitals:   01/25/18 1215  TempSrc:   PainSc: 0-No pain                 Quanell Loughney

## 2018-01-25 NOTE — Interval H&P Note (Signed)
History and Physical Interval Note:  01/25/2018 9:53 AM  Sarah [name]  has presented today for surgery, with the diagnosis of gallstones  The various methods of treatment have been discussed with the patient and family. After consideration of risks, benefits and other options for treatment, the patient has consented to  Procedure(s): LAPAROSCOPIC CHOLECYSTECTOMY (N/A) as a surgical intervention .  The patient's history has been reviewed, patient examined, no change in status, stable for surgery.  I have reviewed the patient's chart and labs.  Questions were answered to the patient's satisfaction.    No changes. Held xarelto. No questions.   Virl Cagey

## 2018-01-25 NOTE — Progress Notes (Signed)
Report called to Devonne Doughty at [facility]. Informed that a rx for pain med is being sent with pt. Instructed that a 2 week postop appt needs to be made. Voiced understanding.

## 2018-01-25 NOTE — Anesthesia Preprocedure Evaluation (Addendum)
Anesthesia Evaluation  Patient identified by MRN, date of birth, ID band Patient awake    Reviewed: Allergy & Precautions, H&P , NPO status , Patient's Chart, lab work & pertinent test results  History of Anesthesia Complications Negative for: history of anesthetic complications  Airway Mallampati: II  TM Distance: >3 FB Neck ROM: full    Dental  (+) Missing, Loose, Dental Advidsory Given   Pulmonary neg pulmonary ROS,    Pulmonary exam normal breath sounds clear to auscultation       Cardiovascular Exercise Tolerance: Good hypertension, negative cardio ROS   Rhythm:regular Rate:Normal     Neuro/Psych PSYCHIATRIC DISORDERS Depression negative neurological ROS     GI/Hepatic Neg liver ROS, GERD  ,  Endo/Other  diabetesHypothyroidism   Renal/GU Renal disease  negative genitourinary   Musculoskeletal   Abdominal   Peds  Hematology  (+) Blood dyscrasia, anemia ,   Anesthesia Other Findings Very loose lower front teeth.  Advisory given about high probability of tooth dislocation during GETA.  She understands and accepts risks.    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[place]) Spinal cord compression due to malignant neoplasm metastatic to spine (HCC)       Reproductive/Obstetrics negative OB ROS                            Anesthesia Physical Anesthesia Plan  ASA: IV  Anesthesia Plan: General   Post-op Pain Management:    Induction:   PONV Risk Score and Plan:   Airway Management Planned:   Additional Equipment:   Intra-op Plan:   Post-operative Plan:   Informed Consent: I have reviewed the patients History and Physical, chart, labs and discussed the procedure including the  risks, benefits and alternatives for the proposed anesthesia with the patient or authorized representative who has indicated his/her understanding and acceptance.   Dental Advisory Given  Plan Discussed with: CRNA  Anesthesia Plan Comments:         Anesthesia Quick Evaluation

## 2018-01-25 NOTE — Transfer of Care (Signed)
Immediate Anesthesia Transfer of Care Note  Patient: Sarah [name]  Procedure(s) Performed: LAPAROSCOPIC CHOLECYSTECTOMY (N/A Abdomen)  Patient Location: PACU  Anesthesia Type:General  Level of Consciousness: sedated  Airway & Oxygen Therapy: Patient Spontanous Breathing  Post-op Assessment: Report given to RN and Post -op Vital signs reviewed and stable  Post vital signs: Reviewed and stable  Last Vitals:  Vitals Value Taken Time  BP    Temp    Pulse 94 01/25/2018 11:45 AM  Resp 16 01/25/2018 11:45 AM  SpO2 99 % 01/25/2018 11:45 AM  Vitals shown include unvalidated device data.  Last Pain:  Vitals:   01/25/18 1024  TempSrc: Oral  PainSc: 3       Patients Stated Pain Goal: 5 (61/44/31 5400)  Complications: No apparent anesthesia complications

## 2018-01-25 NOTE — Discharge Instructions (Signed)
Discharge Instructions: Shower per your regular routine. Take tylenol and ibuprofen as needed for pain control, alternating every 4-6 hours.  Take Roxicodone for breakthrough pain if needed. Take colace for constipation related to narcotic pain medication. Do not pick at the dermabond glue on your incision sites.  Restart your Xarelto Wednesday 01/27/2018.    Laparoscopic Cholecystectomy, Care After This sheet gives you information about how to care for yourself after your procedure. Your doctor may also give you more specific instructions. If you have problems or questions, contact your doctor. Follow these instructions at home: Care for cuts from surgery (incisions)   Follow instructions from your doctor about how to take care of your cuts from surgery. Make sure you: ? Wash your hands with soap and water before you change your bandage (dressing). If you cannot use soap and water, use hand sanitizer. ? Change your bandage as told by your doctor. ? Leave stitches (sutures), skin glue, or skin tape (adhesive) strips in place. They may need to stay in place for 2 weeks or longer. If tape strips get loose and curl up, you may trim the loose edges. Do not remove tape strips completely unless your doctor says it is okay.  Do not take baths, swim, or use a hot tub until your doctor says it is okay. You may shower/ sponge bathe.   Check your surgical cut area every day for signs of infection. Check for: ? More redness, swelling, or pain. ? More fluid or blood. ? Warmth. ? Pus or a bad smell. Activity  Do not drive or use heavy machinery while taking prescription pain medicine.  Do not lift anything that is heavier than 10 lb (4.5 kg) until your doctor says it is okay.  Do not play contact sports until your doctor says it is okay.  Do not drive for 24 hours if you were given a medicine to help you relax (sedative).  Rest as needed. Do not return to work or school until your doctor says  it is okay. General instructions  Take over-the-counter and prescription medicines only as told by your doctor.  To prevent or treat constipation while you are taking prescription pain medicine, your doctor may recommend that you: ? Drink enough fluid to keep your pee (urine) clear or pale yellow. ? Take over-the-counter or prescription medicines. ? Eat foods that are high in fiber, such as fresh fruits and vegetables, whole grains, and beans. ? Limit foods that are high in fat and processed sugars, such as fried and sweet foods. Contact a doctor if:  You develop a rash.  You have more redness, swelling, or pain around your surgical cuts.  You have more fluid or blood coming from your surgical cuts.  Your surgical cuts feel warm to the touch.  You have pus or a bad smell coming from your surgical cuts.  You have a fever.  One or more of your surgical cuts breaks open. Get help right away if:  You have trouble breathing.  You have chest pain.  You have pain that is getting worse in your shoulders.  You faint or feel dizzy when you stand.  You have very bad pain in your belly (abdomen).  You are sick to your stomach (nauseous) for more than one day.  You have throwing up (vomiting) that lasts for more than one day.  You have leg pain. This information is not intended to replace advice given to you by your health care provider. Make  sure you discuss any questions you have with your health care provider. Document Released: 12/18/2007 Document Revised: 09/29/2015 Document Reviewed: 08/27/2015 Elsevier Interactive Patient Education  2018 [employer].

## 2018-01-26 ENCOUNTER — Encounter (HOSPITAL_COMMUNITY): Payer: Self-pay | Admitting: General Surgery

## 2018-01-29 ENCOUNTER — Other Ambulatory Visit: Payer: Self-pay | Admitting: *Deleted

## 2018-01-29 NOTE — Patient Outreach (Signed)
Sarah [name]) Care Management  01/29/2018  Sarah [name] 05/08/1965 [id number]   EMMI-general discharge,   RED ON EMMI ALERT Day # 1 Date: 01/27/18 1400 wednesday Red Alert Reason: read discharge papers? No  unfilled prescriptions,? Yes,  able to fill today/tomorrow? No    Outreach attempt # 1, successful outreach  [name] able to verify HIPAA [facility] Care Management RN reviewed and addressed red alert with patient   EMMI Sarah [name] reports she has read her d/c instructions without concerns.  She reports her prescriptions protonix was not filled but she  will go to fill it today  She reports she had enough medicines sent home with her that she did not need to go to the pharmacy on the day of discharge   Social: Sarah [name] lives alone but has support of her family.  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 Patient gave verbal consent for services. Advised patient that there will be further automated EMMI- post discharge calls to assess how the patient is doing following the recent hospitalization Advised the patient that another call may be received from a nurse if any of their responses were abnormal. Patient voiced understanding and was appreciative of f/u  call.   Plan: [facility] RN CM will close case at this time as patient has been assessed and no needs identified.   [facility] RN CM sent a successful outreach letter as discussed with [name] brochure enclosed for review  Pt encouraged to return a call to [facility] RN CM prn   Sarah [name] L. Lavina Hamman, RN, BSN, [place] Coordinator Office number [phone] Mobile number [phone]  Main [facility] number [phone] Fax number [phone]

## 2018-02-01 DIAGNOSIS — N319 Neuromuscular dysfunction of bladder, unspecified: Secondary | ICD-10-CM | POA: Diagnosis not present

## 2018-02-01 DIAGNOSIS — E119 Type 2 diabetes mellitus without complications: Secondary | ICD-10-CM | POA: Diagnosis not present

## 2018-02-01 DIAGNOSIS — M1990 Unspecified osteoarthritis, unspecified site: Secondary | ICD-10-CM | POA: Diagnosis not present

## 2018-02-01 DIAGNOSIS — Z7901 Long term (current) use of anticoagulants: Secondary | ICD-10-CM | POA: Diagnosis not present

## 2018-02-01 DIAGNOSIS — G8220 Paraplegia, unspecified: Secondary | ICD-10-CM | POA: Diagnosis not present

## 2018-02-01 DIAGNOSIS — G8929 Other chronic pain: Secondary | ICD-10-CM | POA: Diagnosis not present

## 2018-02-01 DIAGNOSIS — M5030 Other cervical disc degeneration, unspecified cervical region: Secondary | ICD-10-CM | POA: Diagnosis not present

## 2018-02-01 DIAGNOSIS — C7951 Secondary malignant neoplasm of bone: Secondary | ICD-10-CM | POA: Diagnosis not present

## 2018-02-01 DIAGNOSIS — Z86711 Personal history of pulmonary embolism: Secondary | ICD-10-CM | POA: Diagnosis not present

## 2018-02-01 DIAGNOSIS — R1314 Dysphagia, pharyngoesophageal phase: Secondary | ICD-10-CM | POA: Diagnosis not present

## 2018-02-01 DIAGNOSIS — E890 Postprocedural hypothyroidism: Secondary | ICD-10-CM | POA: Diagnosis not present

## 2018-02-01 DIAGNOSIS — S24102S Unspecified injury at T2-T6 level of thoracic spinal cord, sequela: Secondary | ICD-10-CM | POA: Diagnosis not present

## 2018-02-01 DIAGNOSIS — C73 Malignant neoplasm of thyroid gland: Secondary | ICD-10-CM | POA: Diagnosis not present

## 2018-02-01 DIAGNOSIS — D630 Anemia in neoplastic disease: Secondary | ICD-10-CM | POA: Diagnosis not present

## 2018-02-01 DIAGNOSIS — Z87440 Personal history of urinary (tract) infections: Secondary | ICD-10-CM | POA: Diagnosis not present

## 2018-02-01 DIAGNOSIS — I10 Essential (primary) hypertension: Secondary | ICD-10-CM | POA: Diagnosis not present

## 2018-02-01 DIAGNOSIS — Z9181 History of falling: Secondary | ICD-10-CM | POA: Diagnosis not present

## 2018-02-02 ENCOUNTER — Telehealth: Payer: Self-pay

## 2018-02-02 NOTE — Telephone Encounter (Signed)
Sarah [name], PT with [facility], called. Patient was set up for [facility] PT at hospital DC. Wants to make sure PCP agrees to sign any orders.  Call back is [phone]

## 2018-02-02 NOTE — Telephone Encounter (Signed)
Magda Paganini aware.  Danley Danker, RN [facility] RN)

## 2018-02-02 NOTE — Telephone Encounter (Signed)
Yes, you can give her verbal orders on my behalf.  Thank you  [name], MD [place], PGY-3

## 2018-02-03 ENCOUNTER — Encounter (HOSPITAL_COMMUNITY): Payer: Self-pay | Admitting: Emergency Medicine

## 2018-02-03 ENCOUNTER — Other Ambulatory Visit: Payer: Self-pay

## 2018-02-03 ENCOUNTER — Inpatient Hospital Stay (HOSPITAL_COMMUNITY)
Admission: EM | Admit: 2018-02-03 | Discharge: 2018-02-23 | DRG: 193 | Disposition: A | Discharge status: Skilled Nursing Facility | Payer: Medicare Other | Attending: Family Medicine | Admitting: Family Medicine

## 2018-02-03 ENCOUNTER — Emergency Department (HOSPITAL_COMMUNITY): Payer: Medicare Other

## 2018-02-03 DIAGNOSIS — C73 Malignant neoplasm of thyroid gland: Secondary | ICD-10-CM | POA: Diagnosis present

## 2018-02-03 DIAGNOSIS — R0600 Dyspnea, unspecified: Secondary | ICD-10-CM

## 2018-02-03 DIAGNOSIS — Z9221 Personal history of antineoplastic chemotherapy: Secondary | ICD-10-CM

## 2018-02-03 DIAGNOSIS — E876 Hypokalemia: Secondary | ICD-10-CM | POA: Diagnosis not present

## 2018-02-03 DIAGNOSIS — D630 Anemia in neoplastic disease: Secondary | ICD-10-CM | POA: Diagnosis present

## 2018-02-03 DIAGNOSIS — C7931 Secondary malignant neoplasm of brain: Secondary | ICD-10-CM | POA: Diagnosis not present

## 2018-02-03 DIAGNOSIS — Z9049 Acquired absence of other specified parts of digestive tract: Secondary | ICD-10-CM

## 2018-02-03 DIAGNOSIS — J811 Chronic pulmonary edema: Secondary | ICD-10-CM | POA: Diagnosis not present

## 2018-02-03 DIAGNOSIS — Z049 Encounter for examination and observation for unspecified reason: Secondary | ICD-10-CM | POA: Diagnosis not present

## 2018-02-03 DIAGNOSIS — J188 Other pneumonia, unspecified organism: Secondary | ICD-10-CM

## 2018-02-03 DIAGNOSIS — I361 Nonrheumatic tricuspid (valve) insufficiency: Secondary | ICD-10-CM | POA: Diagnosis not present

## 2018-02-03 DIAGNOSIS — K5641 Fecal impaction: Secondary | ICD-10-CM | POA: Diagnosis present

## 2018-02-03 DIAGNOSIS — R05 Cough: Secondary | ICD-10-CM | POA: Diagnosis not present

## 2018-02-03 DIAGNOSIS — L98429 Non-pressure chronic ulcer of back with unspecified severity: Secondary | ICD-10-CM | POA: Diagnosis present

## 2018-02-03 DIAGNOSIS — Z79899 Other long term (current) drug therapy: Secondary | ICD-10-CM

## 2018-02-03 DIAGNOSIS — R0602 Shortness of breath: Secondary | ICD-10-CM | POA: Diagnosis not present

## 2018-02-03 DIAGNOSIS — Z7189 Other specified counseling: Secondary | ICD-10-CM | POA: Diagnosis not present

## 2018-02-03 DIAGNOSIS — Z8719 Personal history of other diseases of the digestive system: Secondary | ICD-10-CM

## 2018-02-03 DIAGNOSIS — I959 Hypotension, unspecified: Secondary | ICD-10-CM | POA: Diagnosis not present

## 2018-02-03 DIAGNOSIS — L8942 Pressure ulcer of contiguous site of back, buttock and hip, stage 2: Secondary | ICD-10-CM | POA: Diagnosis not present

## 2018-02-03 DIAGNOSIS — K5900 Constipation, unspecified: Secondary | ICD-10-CM | POA: Diagnosis present

## 2018-02-03 DIAGNOSIS — Z91030 Bee allergy status: Secondary | ICD-10-CM

## 2018-02-03 DIAGNOSIS — Z881 Allergy status to other antibiotic agents status: Secondary | ICD-10-CM

## 2018-02-03 DIAGNOSIS — L89621 Pressure ulcer of left heel, stage 1: Secondary | ICD-10-CM | POA: Diagnosis present

## 2018-02-03 DIAGNOSIS — K5909 Other constipation: Secondary | ICD-10-CM | POA: Diagnosis not present

## 2018-02-03 DIAGNOSIS — R0902 Hypoxemia: Secondary | ICD-10-CM | POA: Diagnosis not present

## 2018-02-03 DIAGNOSIS — Z885 Allergy status to narcotic agent status: Secondary | ICD-10-CM

## 2018-02-03 DIAGNOSIS — J189 Pneumonia, unspecified organism: Secondary | ICD-10-CM | POA: Diagnosis not present

## 2018-02-03 DIAGNOSIS — D34 Benign neoplasm of thyroid gland: Secondary | ICD-10-CM | POA: Diagnosis not present

## 2018-02-03 DIAGNOSIS — J9601 Acute respiratory failure with hypoxia: Secondary | ICD-10-CM | POA: Diagnosis not present

## 2018-02-03 DIAGNOSIS — R042 Hemoptysis: Secondary | ICD-10-CM | POA: Diagnosis not present

## 2018-02-03 DIAGNOSIS — S72402A Unspecified fracture of lower end of left femur, initial encounter for closed fracture: Secondary | ICD-10-CM | POA: Diagnosis present

## 2018-02-03 DIAGNOSIS — K592 Neurogenic bowel, not elsewhere classified: Secondary | ICD-10-CM | POA: Diagnosis not present

## 2018-02-03 DIAGNOSIS — C7951 Secondary malignant neoplasm of bone: Secondary | ICD-10-CM | POA: Diagnosis present

## 2018-02-03 DIAGNOSIS — D649 Anemia, unspecified: Secondary | ICD-10-CM | POA: Diagnosis not present

## 2018-02-03 DIAGNOSIS — E890 Postprocedural hypothyroidism: Secondary | ICD-10-CM | POA: Diagnosis not present

## 2018-02-03 DIAGNOSIS — L89152 Pressure ulcer of sacral region, stage 2: Secondary | ICD-10-CM | POA: Diagnosis not present

## 2018-02-03 DIAGNOSIS — Z7901 Long term (current) use of anticoagulants: Secondary | ICD-10-CM | POA: Diagnosis not present

## 2018-02-03 DIAGNOSIS — Z7951 Long term (current) use of inhaled steroids: Secondary | ICD-10-CM

## 2018-02-03 DIAGNOSIS — Z923 Personal history of irradiation: Secondary | ICD-10-CM

## 2018-02-03 DIAGNOSIS — R531 Weakness: Secondary | ICD-10-CM | POA: Diagnosis not present

## 2018-02-03 DIAGNOSIS — I370 Nonrheumatic pulmonary valve stenosis: Secondary | ICD-10-CM | POA: Diagnosis not present

## 2018-02-03 DIAGNOSIS — Z981 Arthrodesis status: Secondary | ICD-10-CM

## 2018-02-03 DIAGNOSIS — E860 Dehydration: Secondary | ICD-10-CM | POA: Diagnosis present

## 2018-02-03 DIAGNOSIS — E8809 Other disorders of plasma-protein metabolism, not elsewhere classified: Secondary | ICD-10-CM | POA: Diagnosis not present

## 2018-02-03 DIAGNOSIS — K219 Gastro-esophageal reflux disease without esophagitis: Secondary | ICD-10-CM | POA: Diagnosis present

## 2018-02-03 DIAGNOSIS — G8220 Paraplegia, unspecified: Secondary | ICD-10-CM | POA: Diagnosis present

## 2018-02-03 DIAGNOSIS — Z9089 Acquired absence of other organs: Secondary | ICD-10-CM

## 2018-02-03 DIAGNOSIS — Z86711 Personal history of pulmonary embolism: Secondary | ICD-10-CM

## 2018-02-03 DIAGNOSIS — Z515 Encounter for palliative care: Secondary | ICD-10-CM

## 2018-02-03 DIAGNOSIS — Y95 Nosocomial condition: Secondary | ICD-10-CM | POA: Diagnosis present

## 2018-02-03 DIAGNOSIS — Z888 Allergy status to other drugs, medicaments and biological substances status: Secondary | ICD-10-CM

## 2018-02-03 DIAGNOSIS — I10 Essential (primary) hypertension: Secondary | ICD-10-CM | POA: Diagnosis present

## 2018-02-03 DIAGNOSIS — Z823 Family history of stroke: Secondary | ICD-10-CM

## 2018-02-03 DIAGNOSIS — Z8249 Family history of ischemic heart disease and other diseases of the circulatory system: Secondary | ICD-10-CM

## 2018-02-03 DIAGNOSIS — L8990 Pressure ulcer of unspecified site, unspecified stage: Secondary | ICD-10-CM

## 2018-02-03 DIAGNOSIS — Z79890 Hormone replacement therapy (postmenopausal): Secondary | ICD-10-CM

## 2018-02-03 DIAGNOSIS — Z833 Family history of diabetes mellitus: Secondary | ICD-10-CM

## 2018-02-03 LAB — CBC WITH DIFFERENTIAL/PLATELET
ABS IMMATURE GRANULOCYTES: 0.03 10*3/uL (ref 0.00–0.07)
BASOS PCT: 0 %
Basophils Absolute: 0 10*3/uL (ref 0.0–0.1)
EOS ABS: 0 10*3/uL (ref 0.0–0.5)
EOS PCT: 0 %
HCT: 29.6 % — ABNORMAL LOW (ref 36.0–46.0)
Hemoglobin: 9 g/dL — ABNORMAL LOW (ref 12.0–15.0)
Immature Granulocytes: 1 %
LYMPHS PCT: 10 %
Lymphs Abs: 0.5 10*3/uL — ABNORMAL LOW (ref 0.7–4.0)
MCH: 28.6 pg (ref 26.0–34.0)
MCHC: 30.4 g/dL (ref 30.0–36.0)
MCV: 94 fL (ref 80.0–100.0)
Monocytes Absolute: 0.2 10*3/uL (ref 0.1–1.0)
Monocytes Relative: 4 %
NRBC: 0 % (ref 0.0–0.2)
Neutro Abs: 4.4 10*3/uL (ref 1.7–7.7)
Neutrophils Relative %: 85 %
PLATELETS: 192 10*3/uL (ref 150–400)
RBC: 3.15 MIL/uL — ABNORMAL LOW (ref 3.87–5.11)
RDW: 14.2 % (ref 11.5–15.5)
WBC: 5.2 10*3/uL (ref 4.0–10.5)

## 2018-02-03 LAB — BASIC METABOLIC PANEL
Anion gap: 7 (ref 5–15)
BUN: 19 mg/dL (ref 6–20)
CHLORIDE: 103 mmol/L (ref 98–111)
CO2: 24 mmol/L (ref 22–32)
CREATININE: 0.93 mg/dL (ref 0.44–1.00)
Calcium: 7.5 mg/dL — ABNORMAL LOW (ref 8.9–10.3)
GFR calc non Af Amer: >60 mL/min (ref >60)
Glucose, Bld: 110 mg/dL — ABNORMAL HIGH (ref 70–99)
Potassium: 3.6 mmol/L (ref 3.5–5.1)
SODIUM: 134 mmol/L — AB (ref 135–145)

## 2018-02-03 LAB — I-STAT CG4 LACTIC ACID, ED: Lactic Acid, Venous: 1.02 mmol/L (ref 0.5–1.9)

## 2018-02-03 MED ORDER — SODIUM CHLORIDE 0.9 % IV SOLN
500.0000 mg | Freq: Once | INTRAVENOUS | Status: AC
  Administered 2018-02-03: 500 mg via INTRAVENOUS
  Filled 2018-02-03: qty 500

## 2018-02-03 MED ORDER — METOCLOPRAMIDE HCL 5 MG/ML IJ SOLN
5.0000 mg | Freq: Once | INTRAMUSCULAR | Status: AC
  Administered 2018-02-03: 5 mg via INTRAVENOUS
  Filled 2018-02-03: qty 2

## 2018-02-03 MED ORDER — FENTANYL CITRATE (PF) 100 MCG/2ML IJ SOLN
100.0000 ug | Freq: Once | INTRAMUSCULAR | Status: AC
  Administered 2018-02-03: 100 ug via INTRAVENOUS
  Filled 2018-02-03: qty 2

## 2018-02-03 MED ORDER — ONDANSETRON HCL 4 MG/2ML IJ SOLN
4.0000 mg | Freq: Once | INTRAMUSCULAR | Status: AC
  Administered 2018-02-03: 4 mg via INTRAVENOUS
  Filled 2018-02-03: qty 2

## 2018-02-03 MED ORDER — SODIUM CHLORIDE 0.9 % IV SOLN
1.0000 g | Freq: Once | INTRAVENOUS | Status: AC
  Administered 2018-02-03: 1 g via INTRAVENOUS
  Filled 2018-02-03: qty 10

## 2018-02-03 NOTE — ED Provider Notes (Addendum)
[facility] EMERGENCY DEPARTMENT Provider Note   CSN: [id number] Arrival date & time: 02/03/18  2029     History   Chief Complaint Chief Complaint  Patient presents with  . Hemoptysis    HPI Sarah [name] is a 52 y.o. female.  Signs of cough productive of yellow sputum mixed with flecks of blood and shortness of breath onset 3 days ago.  Other associated symptoms include nausea.  She denies any fever.  No treatment prior to coming here.  No other associated symptoms  HPI  Past Medical History:  Diagnosis Date  . 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 Chronic back pain   . Complication of anesthesia    Nausea and vomiting  . DDD (degenerative disc disease), cervical   . DJD (degenerative joint disease)   . Family history of adverse reaction to anesthesia    MOTHER HAS NAUSEA   . GERD (gastroesophageal reflux disease)   . History of rectal fissure   . HIT (heparin-induced thrombocytopenia) ([place])   . Hypertension   . Hypothyroidism   . Obesity   . 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 Pulmonary embolus, right ([place]) 2015  . Rotator cuff tendonitis right    Patient Active Problem List   Diagnosis Date Noted  . Fracture   . Acute lower UTI   . Myelopathy ([place]) 09/21/2017  . Muscle spasms of both lower extremities   . Closed fracture of shaft of left femur ([place])   . Acute blood loss anemia   . Hypothyroidism   . Nausea & vomiting 09/15/2017  . Closed fracture of left distal femur ([place]) 09/11/2017  . Paraplegia ([place]) 09/08/2017  . Obesity (BMI 30-39.9) 09/03/2017  . Status post surgery 09/03/2017  . Calculus of gallbladder without cholecystitis without obstruction   . Biliary dyskinesia   . Intractable nausea and vomiting 08/09/2017  . Thrombocytopenia ([place]) 08/09/2017  . Elevated bilirubin 08/09/2017  . AKI (acute kidney injury) ([place]) 08/09/2017  . Nasal congestion 04/05/2017  . Breast pain 01/26/2017  . Upper respiratory  infection, viral 01/26/2017  . Counseling regarding advanced care planning and goals of care 01/02/2017  . Bilateral rotator cuff syndrome 02/12/2016  . E. coli UTI 08/01/2015  . Constipation due to neurogenic bowel 08/01/2015  . Muscle spasticity   . 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 Spastic paraparesis   . Neurogenic bladder   . History of pulmonary embolism   . Post-operative pain   . Metastatic cancer ([place])   . Steroid-induced hyperglycemia   . Depression   . Obstipation   . Thoracic spine tumor 07/06/2015  . 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 Spinal cord compression due to malignant neoplasm metastatic to spine ([place])   . Postoperative hypothyroidism   . 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 Spasticity 09/19/2014  . Dysuria 09/04/2014  . Hypertension 05/01/2014  . Ingrown right big toenail 05/01/2014  . GERD (gastroesophageal reflux disease) 02/14/2014  . Dysphagia, pharyngoesophageal phase 02/06/2014  . Type 2 diabetes mellitus without complication ([place]) 96/78/9381  . Constipation   . Neurogenic bowel   . 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 Metastatic cancer to spine ([place]) 01/19/2014  . Rotator cuff tendonitis   . 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 Leg weakness, bilateral 12/13/2013    Past Surgical History:  Procedure Laterality  Date  . CHOLECYSTECTOMY N/A 01/25/2018   Procedure: LAPAROSCOPIC CHOLECYSTECTOMY;  Surgeon: Virl Cagey, MD;  Location: [facility];  Service: General;  Laterality: N/A;  . FEMUR IM NAIL Left 09/11/2017   Procedure: INTRAMEDULLARY (IM) RETROGRADE FEMORAL NAILING;  Surgeon: Altamese Fulton, MD;  Location: [place];  Service: Orthopedics;  Laterality: Left;  . LAMINECTOMY N/A 12/14/2013   Procedure: THORACIC LAMINECTOMY FOR TUMOR THORACIC THREE;  Surgeon: Ashok Pall, MD;  Location: [place] [facility];  Service:  Neurosurgery;  Laterality: N/A;  THORACIC LAMINECTOMY FOR TUMOR THORACIC THREE  . LAMINECTOMY N/A 07/05/2015   Procedure: THORACIC LAMINECTOMY FOR TUMOR;  Surgeon: Ashok Pall, MD;  Location: [facility];  Service: Neurosurgery;  Laterality: N/A;  . LAMINECTOMY N/A 09/03/2017   Procedure: POSTERIOR SPINAL TUMOR RESECTION THORACIC THREE;  Surgeon: Ashok Pall, MD;  Location: [place];  Service: Neurosurgery;  Laterality: N/A;  POSTERIOR SPINAL TUMOR RESECTION THORACIC THREE  . POSTERIOR LUMBAR FUSION 4 LEVEL N/A 12/30/2013   Procedure: Thoracic one-Thoracic five posterior thoracic fusion with pedicle screws;  Surgeon: Ashok Pall, MD;  Location: [place] [facility];  Service: Neurosurgery;  Laterality: N/A;  Thoracic one-Thoracic five posterior thoracic fusion with pedicle screws  . THYROIDECTOMY N/A 01/09/2014   Procedure: TOTAL THYROIDECTOMY;  Surgeon: Izora Gala, MD;  Location: [place];  Service: ENT;  Laterality: N/A;  . TONSILLECTOMY       OB History   None      Home Medications    Prior to Admission medications   Medication Sig Start Date End Date Taking? Authorizing Provider  acetaminophen (TYLENOL) 325 MG tablet Take 2 tablets (650 mg total) by mouth every 4 (four) hours as needed for mild pain ((score 1 to 3) or temp > 100.5). 09/11/17   Angiulli, Lavon Paganini, PA-C  baclofen (LIORESAL) 20 MG tablet Take 1 tablet (20 mg total) by mouth 3 (three) times daily. 10/29/17   Angiulli, Lavon Paganini, PA-C  bisacodyl (DULCOLAX) 10 MG suppository Place 1 suppository (10 mg total) rectally at bedtime. 09/11/17   Angiulli, Lavon Paganini, PA-C  famotidine (PEPCID) 20 MG tablet Take 1 tablet (20 mg total) by mouth 2 (two) times daily. 09/21/17   Roxan Hockey, MD  fluticasone (FLONASE) 50 MCG/ACT nasal spray Place 2 sprays into both nostrils daily. 04/01/17   Carlyle Dolly, MD  levothyroxine (SYNTHROID, LEVOTHROID) 137 MCG tablet Take 137 mcg by mouth daily before breakfast.    [provider]  loratadine  (CLARITIN) 10 MG tablet Take 1 tablet (10 mg total) by mouth daily. 10/30/17   Angiulli, Lavon Paganini, PA-C  ondansetron (ZOFRAN) 4 MG tablet Take 1 tablet by mouth every 6 (six) hours as needed. 07/25/14   [provider]  oxyCODONE (ROXICODONE) 5 MG immediate release tablet Take 1 tablet (5 mg total) by mouth every 4 (four) hours as needed. 01/25/18 01/25/19  Virl Cagey, MD  pantoprazole (PROTONIX) 40 MG tablet Take 1 tablet by mouth daily. 01/17/15   [provider]  polyethylene glycol (MIRALAX / GLYCOLAX) packet Take 17 g by mouth daily. 10/30/17   Angiulli, Lavon Paganini, PA-C  rivaroxaban (XARELTO) 20 MG TABS tablet Take 20 mg by mouth daily with supper.    [provider]  senna (SENOKOT) 8.6 MG TABS tablet Take 1 tablet (8.6 mg total) by mouth 2 (two) times daily. 10/29/17   Angiulli, Lavon Paganini, PA-C  sertraline (ZOLOFT) 25 MG tablet Take 1 tablet (25 mg total) by mouth daily. 10/29/17   [place],  Lavon Paganini, PA-C  sodium phosphate (FLEET) 7-19 GM/118ML ENEM Place 133 mLs (1 enema total) rectally daily as needed for severe constipation. Patient not taking: Reported on 01/05/2018 10/29/17   Angiulli, Lavon Paganini, PA-C  sucralfate (CARAFATE) 1 g tablet Take 1 tablet (1 g total) by mouth 4 (four) times daily -  with meals and at bedtime. Please dissolve tablet in 10-15 mL of water to create a slurry. Patient should drink 10-15 minutes before each meals and at bedtime to reduce painful swallowing. 11/13/17   Tyler Pita, MD  sucralfate (CARAFATE) 1 GM/10ML suspension Take 10 mLs (1 g total) by mouth 4 (four) times daily -  with meals and at bedtime. 10/29/17   Angiulli, Lavon Paganini, PA-C    Family History Family History  Problem Relation Age of Onset  . Hypertension Mother   . Diabetes Mother   . Hypertension Father   . Stroke Father   . Diabetes Father   . Diabetes Sister   . Diabetes Sister     Social History Social History   Tobacco Use  . Smoking status: Never Smoker  .  Smokeless tobacco: Never Used  Substance Use Topics  . Alcohol use: No  . Drug use: No     Allergies   Bee venom; Keflex [cephalexin]; Tramadol; Gabapentin; Heparin; Hydrochlorothiazide; Hydrocodone; Oxycodone-acetaminophen; Sorbitol; and Tizanidine   Review of Systems Review of Systems  Respiratory: Positive for cough and shortness of breath.   Gastrointestinal: Positive for nausea.  Musculoskeletal: Positive for back pain and gait problem.       Paraplegic, chronic back pain  Allergic/Immunologic: Positive for immunocompromised state.  Neurological:       Paraplegic  All other systems reviewed and are negative.    Physical Exam Updated Vital Signs BP 106/69 (BP Location: Right Arm)   Pulse 79   Temp 98 F (36.7 C) (Oral)   Resp 20   Ht 5\' 11"  (1.803 m)   Wt 99.8 kg   LMP 10/22/2016 (LMP Unknown)   SpO2 90% Comment: pt placed on 3L O2  BMI 30.68 kg/m   Physical Exam  Constitutional: She appears distressed.  Chronically ill-appearing.  Appears uncomfortable  HENT:  Head: Normocephalic and atraumatic.  Eyes: Pupils are equal, round, and reactive to light. Conjunctivae are normal.  Neck: Neck supple. No tracheal deviation present. No thyromegaly present.  Cardiovascular: Normal rate and regular rhythm.  No murmur heard. Pulmonary/Chest: No respiratory distress.  Diminished breath sounds diffusely  Abdominal: Soft. Bowel sounds are normal. She exhibits no distension. There is no tenderness.  Musculoskeletal: Normal range of motion. She exhibits no edema or tenderness.  Neurological: She is alert. Coordination normal.  Skin: Skin is warm and dry. Capillary refill takes less than 2 seconds. No rash noted.  Psychiatric: She has a normal mood and affect.  Nursing note and vitals reviewed.    ED Treatments / Results  Labs (all labs ordered are listed, but only abnormal results are displayed) Labs Reviewed  BASIC METABOLIC PANEL  CBC WITH DIFFERENTIAL/PLATELET    I-STAT CG4 LACTIC ACID, ED    EKG None  Radiology No results found.  Procedures Procedures (including critical care time)  Medications Ordered in ED Medications  metoCLOPramide (REGLAN) injection 5 mg (has no administration in time range)   Chest x-ray viewed by me Results for orders placed or performed during the hospital encounter of 76/19/50  Basic metabolic panel  Result Value Ref Range   Sodium 134 (L) 135 - 145  mmol/L   Potassium 3.6 3.5 - 5.1 mmol/L   Chloride 103 98 - 111 mmol/L   CO2 24 22 - 32 mmol/L   Glucose, Bld 110 (H) 70 - 99 mg/dL   BUN 19 6 - 20 mg/dL   Creatinine, Ser 0.93 0.44 - 1.00 mg/dL   Calcium 7.5 (L) 8.9 - 10.3 mg/dL   GFR calc non Af Amer >60 >60 mL/min   GFR calc Af Amer >60 >60 mL/min   Anion gap 7 5 - 15  CBC with Differential/Platelet  Result Value Ref Range   WBC 5.2 4.0 - 10.5 K/uL   RBC 3.15 (L) 3.87 - 5.11 MIL/uL   Hemoglobin 9.0 (L) 12.0 - 15.0 g/dL   HCT 29.6 (L) 36.0 - 46.0 %   MCV 94.0 80.0 - 100.0 fL   MCH 28.6 26.0 - 34.0 pg   MCHC 30.4 30.0 - 36.0 g/dL   RDW 14.2 11.5 - 15.5 %   Platelets 192 150 - 400 K/uL   nRBC 0.0 0.0 - 0.2 %   Neutrophils Relative % 85 %   Neutro Abs 4.4 1.7 - 7.7 K/uL   Lymphocytes Relative 10 %   Lymphs Abs 0.5 (L) 0.7 - 4.0 K/uL   Monocytes Relative 4 %   Monocytes Absolute 0.2 0.1 - 1.0 K/uL   Eosinophils Relative 0 %   Eosinophils Absolute 0.0 0.0 - 0.5 K/uL   Basophils Relative 0 %   Basophils Absolute 0.0 0.0 - 0.1 K/uL   Immature Granulocytes 1 %   Abs Immature Granulocytes 0.03 0.00 - 0.07 K/uL  I-Stat CG4 Lactic Acid, ED  Result Value Ref Range   Lactic Acid, Venous 1.02 0.5 - 1.9 mmol/L   *Note: Due to a large number of results and/or encounters for the requested time period, some results have not been displayed. A complete set of results can be found in Results Review.   Dg Chest 2 View  Result Date: 02/03/2018 CLINICAL DATA:  Cough. EXAM: CHEST - 2 VIEW COMPARISON:  December 13, 2017 FINDINGS: There is a masslike opacity in the right upper lung, not seen in September 2019. There is infiltrate in the left retrocardiac region as well. No other interval changes or acute abnormalities. IMPRESSION: 1. The masslike opacity in the right upper lung is most likely infectious given the rapid development since December 13, 2017. There is also infiltrate in the left base, likely pneumonia. Recommend short-term follow-up after treatment to ensure resolution. Electronically Signed   By: Dorise Bullion III M.D   On: 02/03/2018 22:03   Nm Hepato W/eject Fract  Result Date: 01/06/2018 CLINICAL DATA:  Chronic abdominal pain.  Nausea and vomiting. EXAM: NUCLEAR MEDICINE HEPATOBILIARY IMAGING WITH GALLBLADDER EF TECHNIQUE: Sequential images of the abdomen were obtained out to 60 minutes following intravenous administration of radiopharmaceutical. After slow intravenous infusion of 2.01 micrograms Cholecystokinin, gallbladder ejection fraction was determined. RADIOPHARMACEUTICALS:  5.0 mCi Tc-56m Choletec IV COMPARISON:  12/13/2017 FINDINGS: Prompt uptake and biliary excretion of activity by the liver is seen. Gallbladder activity is visualized, consistent with patency of cystic duct. Biliary activity passes into small bowel, consistent with patent common bile duct. Calculated gallbladder ejection fraction is 73%. (At 60 min, normal ejection fraction is greater than 40%.) IMPRESSION: 1. Patent cystic duct without evidence for acute cholecystitis. 2. Normal gallbladder ejection fraction. Electronically Signed   By: Kerby Moors M.D.   On: 01/06/2018 13:56  Chest x-ray viewed by me.  With frequent cough, productive  and hypoxemia, clinically patient has pneumonia.  Initial Impression / Assessment and Plan / ED Course  I have reviewed the triage vital signs and the nursing notes.  Pertinent labs & imaging results that were available during my care of the patient were reviewed by me and considered  in my medical decision making (see chart for details).    Nurse reported to me patient had pulse oximetry of 83% on room air, consistent with hypoxia placed on oxygen 3 L nasal cannula Pulse oximetry on oxygen 5 L nasal cannula 90%, low normal.  Oxygen ordered to titrate to pulse ox of greater than or equal to 90%.  Treat for community-acquired pneumonia.  Patient has oxygen requirement.  Lab work consistent with anemia, unchanged from 3 weeks ago and mild hypocalcemia  11 PM pain and nausea improved after treatment with IV antiemetics and IV  opioids DrDarrick Meigs from hospitalist service consulted and will arrange for overnight stay Final Clinical Impressions(s) / ED Diagnoses  Diagnosis #1 community-acquired pneumonia #2 hypoxia #3 anemia #4 hypocalcemia  Final diagnoses:  None    ED Discharge Orders    None       Orlie Dakin, MD 02/03/18 2318 CRITICAL CARE Performed by: Orlie Dakin Total critical care time: 30 minutes Critical care time was exclusive of separately billable procedures and treating other patients. Critical care was necessary to treat or prevent imminent or life-threatening deterioration. Critical care was time spent personally by me on the following activities: development of treatment plan with patient and/or surrogate as well as nursing, discussions with consultants, evaluation of patient's response to treatment, examination of patient, obtaining history from patient or surrogate, ordering and performing treatments and interventions, ordering and review of laboratory studies, ordering and review of radiographic studies, pulse oximetry and re-evaluation of patient's condition.   Orlie Dakin, MD 02/15/18 1055

## 2018-02-03 NOTE — ED Notes (Signed)
Pt's O2 87% on 3L/min via [place] Dr. Winfred Leeds stated to increase until sating at 90%. Pt now on 5L/min

## 2018-02-03 NOTE — ED Triage Notes (Signed)
Pt here by [facility] with c/o cough x 2-3 days with n/d/chills, pt states last night she began coughing up small amts of bright red blood

## 2018-02-04 ENCOUNTER — Encounter (HOSPITAL_COMMUNITY): Payer: Self-pay

## 2018-02-04 ENCOUNTER — Ambulatory Visit: Payer: Self-pay | Admitting: General Surgery

## 2018-02-04 ENCOUNTER — Ambulatory Visit: Admit: 2018-02-04 | Payer: Medicare Other | Admitting: Surgery

## 2018-02-04 DIAGNOSIS — M6281 Muscle weakness (generalized): Secondary | ICD-10-CM | POA: Diagnosis not present

## 2018-02-04 DIAGNOSIS — L89621 Pressure ulcer of left heel, stage 1: Secondary | ICD-10-CM | POA: Diagnosis present

## 2018-02-04 DIAGNOSIS — I361 Nonrheumatic tricuspid (valve) insufficiency: Secondary | ICD-10-CM | POA: Diagnosis not present

## 2018-02-04 DIAGNOSIS — J9601 Acute respiratory failure with hypoxia: Secondary | ICD-10-CM | POA: Diagnosis not present

## 2018-02-04 DIAGNOSIS — Z7189 Other specified counseling: Secondary | ICD-10-CM | POA: Diagnosis not present

## 2018-02-04 DIAGNOSIS — D631 Anemia in chronic kidney disease: Secondary | ICD-10-CM | POA: Diagnosis not present

## 2018-02-04 DIAGNOSIS — Z923 Personal history of irradiation: Secondary | ICD-10-CM | POA: Diagnosis not present

## 2018-02-04 DIAGNOSIS — L98429 Non-pressure chronic ulcer of back with unspecified severity: Secondary | ICD-10-CM | POA: Diagnosis present

## 2018-02-04 DIAGNOSIS — Z86711 Personal history of pulmonary embolism: Secondary | ICD-10-CM | POA: Diagnosis not present

## 2018-02-04 DIAGNOSIS — E890 Postprocedural hypothyroidism: Secondary | ICD-10-CM | POA: Diagnosis present

## 2018-02-04 DIAGNOSIS — G8220 Paraplegia, unspecified: Secondary | ICD-10-CM | POA: Diagnosis not present

## 2018-02-04 DIAGNOSIS — K5641 Fecal impaction: Secondary | ICD-10-CM | POA: Diagnosis not present

## 2018-02-04 DIAGNOSIS — K592 Neurogenic bowel, not elsewhere classified: Secondary | ICD-10-CM | POA: Diagnosis present

## 2018-02-04 DIAGNOSIS — D630 Anemia in neoplastic disease: Secondary | ICD-10-CM | POA: Diagnosis present

## 2018-02-04 DIAGNOSIS — R5381 Other malaise: Secondary | ICD-10-CM | POA: Diagnosis not present

## 2018-02-04 DIAGNOSIS — K5909 Other constipation: Secondary | ICD-10-CM | POA: Diagnosis not present

## 2018-02-04 DIAGNOSIS — L8990 Pressure ulcer of unspecified site, unspecified stage: Secondary | ICD-10-CM

## 2018-02-04 DIAGNOSIS — R109 Unspecified abdominal pain: Secondary | ICD-10-CM | POA: Diagnosis not present

## 2018-02-04 DIAGNOSIS — J189 Pneumonia, unspecified organism: Secondary | ICD-10-CM | POA: Diagnosis not present

## 2018-02-04 DIAGNOSIS — L89152 Pressure ulcer of sacral region, stage 2: Secondary | ICD-10-CM | POA: Diagnosis present

## 2018-02-04 DIAGNOSIS — Y95 Nosocomial condition: Secondary | ICD-10-CM | POA: Diagnosis present

## 2018-02-04 DIAGNOSIS — Z7401 Bed confinement status: Secondary | ICD-10-CM | POA: Diagnosis not present

## 2018-02-04 DIAGNOSIS — E8809 Other disorders of plasma-protein metabolism, not elsewhere classified: Secondary | ICD-10-CM | POA: Diagnosis present

## 2018-02-04 DIAGNOSIS — E876 Hypokalemia: Secondary | ICD-10-CM | POA: Diagnosis not present

## 2018-02-04 DIAGNOSIS — R0602 Shortness of breath: Secondary | ICD-10-CM | POA: Diagnosis not present

## 2018-02-04 DIAGNOSIS — Z8719 Personal history of other diseases of the digestive system: Secondary | ICD-10-CM | POA: Diagnosis not present

## 2018-02-04 DIAGNOSIS — I370 Nonrheumatic pulmonary valve stenosis: Secondary | ICD-10-CM | POA: Diagnosis not present

## 2018-02-04 DIAGNOSIS — C73 Malignant neoplasm of thyroid gland: Secondary | ICD-10-CM | POA: Diagnosis not present

## 2018-02-04 DIAGNOSIS — Z7901 Long term (current) use of anticoagulants: Secondary | ICD-10-CM | POA: Diagnosis not present

## 2018-02-04 DIAGNOSIS — D649 Anemia, unspecified: Secondary | ICD-10-CM | POA: Diagnosis not present

## 2018-02-04 DIAGNOSIS — Z9221 Personal history of antineoplastic chemotherapy: Secondary | ICD-10-CM | POA: Diagnosis not present

## 2018-02-04 DIAGNOSIS — R601 Generalized edema: Secondary | ICD-10-CM | POA: Diagnosis not present

## 2018-02-04 DIAGNOSIS — E860 Dehydration: Secondary | ICD-10-CM | POA: Diagnosis present

## 2018-02-04 DIAGNOSIS — Z515 Encounter for palliative care: Secondary | ICD-10-CM | POA: Diagnosis not present

## 2018-02-04 DIAGNOSIS — K5900 Constipation, unspecified: Secondary | ICD-10-CM | POA: Diagnosis not present

## 2018-02-04 DIAGNOSIS — G8221 Paraplegia, complete: Secondary | ICD-10-CM | POA: Diagnosis not present

## 2018-02-04 DIAGNOSIS — C7951 Secondary malignant neoplasm of bone: Secondary | ICD-10-CM | POA: Diagnosis not present

## 2018-02-04 DIAGNOSIS — R4020 Unspecified coma: Secondary | ICD-10-CM | POA: Diagnosis not present

## 2018-02-04 DIAGNOSIS — D34 Benign neoplasm of thyroid gland: Secondary | ICD-10-CM | POA: Diagnosis not present

## 2018-02-04 DIAGNOSIS — C7931 Secondary malignant neoplasm of brain: Secondary | ICD-10-CM | POA: Diagnosis not present

## 2018-02-04 DIAGNOSIS — K219 Gastro-esophageal reflux disease without esophagitis: Secondary | ICD-10-CM | POA: Diagnosis present

## 2018-02-04 DIAGNOSIS — I10 Essential (primary) hypertension: Secondary | ICD-10-CM | POA: Diagnosis present

## 2018-02-04 DIAGNOSIS — J181 Lobar pneumonia, unspecified organism: Secondary | ICD-10-CM | POA: Diagnosis not present

## 2018-02-04 DIAGNOSIS — I959 Hypotension, unspecified: Secondary | ICD-10-CM | POA: Diagnosis not present

## 2018-02-04 DIAGNOSIS — J811 Chronic pulmonary edema: Secondary | ICD-10-CM | POA: Diagnosis present

## 2018-02-04 LAB — IRON AND TIBC
IRON: 10 ug/dL — AB (ref 28–170)
SATURATION RATIOS: 5 % — AB (ref 10.4–31.8)
TIBC: 198 ug/dL — AB (ref 250–450)
UIBC: 188 ug/dL

## 2018-02-04 LAB — COMPREHENSIVE METABOLIC PANEL
ALK PHOS: 114 U/L (ref 38–126)
ALT: 8 U/L (ref 0–44)
AST: 12 U/L — ABNORMAL LOW (ref 15–41)
Albumin: 1.9 g/dL — ABNORMAL LOW (ref 3.5–5.0)
Anion gap: 6 (ref 5–15)
BUN: 19 mg/dL (ref 6–20)
CO2: 26 mmol/L (ref 22–32)
CREATININE: 0.9 mg/dL (ref 0.44–1.00)
Calcium: 7.4 mg/dL — ABNORMAL LOW (ref 8.9–10.3)
Chloride: 103 mmol/L (ref 98–111)
GFR calc non Af Amer: >60 mL/min (ref >60)
GLUCOSE: 102 mg/dL — AB (ref 70–99)
Potassium: 4.5 mmol/L (ref 3.5–5.1)
SODIUM: 135 mmol/L (ref 135–145)
TOTAL PROTEIN: 4.9 g/dL — AB (ref 6.5–8.1)
Total Bilirubin: 0.6 mg/dL (ref 0.3–1.2)

## 2018-02-04 LAB — CBC
HCT: 25.5 % — ABNORMAL LOW (ref 36.0–46.0)
Hemoglobin: 7.8 g/dL — ABNORMAL LOW (ref 12.0–15.0)
MCH: 28.9 pg (ref 26.0–34.0)
MCHC: 30.6 g/dL (ref 30.0–36.0)
MCV: 94.4 fL (ref 80.0–100.0)
NRBC: 0 % (ref 0.0–0.2)
PLATELETS: 170 10*3/uL (ref 150–400)
RBC: 2.7 MIL/uL — ABNORMAL LOW (ref 3.87–5.11)
RDW: 14.1 % (ref 11.5–15.5)
WBC: 7.4 10*3/uL (ref 4.0–10.5)

## 2018-02-04 LAB — RETICULOCYTES
IMMATURE RETIC FRACT: 17.4 % — AB (ref 2.3–15.9)
RBC.: 2.67 MIL/uL — ABNORMAL LOW (ref 3.87–5.11)
Retic Count, Absolute: 49.7 10*3/uL (ref 19.0–186.0)
Retic Ct Pct: 1.9 % (ref 0.4–3.1)

## 2018-02-04 LAB — FERRITIN: Ferritin: 112 ng/mL (ref 11–307)

## 2018-02-04 LAB — MRSA PCR SCREENING: MRSA by PCR: POSITIVE — AB

## 2018-02-04 LAB — VITAMIN B12: VITAMIN B 12: 219 pg/mL (ref 180–914)

## 2018-02-04 LAB — FOLATE: Folate: 6.9 ng/mL (ref >5.9)

## 2018-02-04 SURGERY — LAPAROSCOPIC CHOLECYSTECTOMY
Anesthesia: General

## 2018-02-04 MED ORDER — SODIUM CHLORIDE 0.9% FLUSH
10.0000 mL | INTRAVENOUS | Status: DC | PRN
  Administered 2018-02-08 – 2018-02-17 (×3): 10 mL
  Filled 2018-02-04 (×3): qty 40

## 2018-02-04 MED ORDER — OXYCODONE HCL 5 MG PO TABS
5.0000 mg | ORAL_TABLET | Freq: Four times a day (QID) | ORAL | Status: DC | PRN
  Administered 2018-02-06 – 2018-02-22 (×22): 5 mg via ORAL
  Filled 2018-02-04 (×23): qty 1

## 2018-02-04 MED ORDER — ONDANSETRON HCL 4 MG PO TABS
4.0000 mg | ORAL_TABLET | Freq: Four times a day (QID) | ORAL | Status: DC | PRN
  Administered 2018-02-11 – 2018-02-20 (×2): 4 mg via ORAL
  Filled 2018-02-04 (×3): qty 1

## 2018-02-04 MED ORDER — ONDANSETRON HCL 4 MG/2ML IJ SOLN
4.0000 mg | Freq: Four times a day (QID) | INTRAMUSCULAR | Status: DC | PRN
  Administered 2018-02-04 – 2018-02-20 (×23): 4 mg via INTRAVENOUS
  Filled 2018-02-04 (×23): qty 2

## 2018-02-04 MED ORDER — LEVOTHYROXINE SODIUM 75 MCG PO TABS
150.0000 ug | ORAL_TABLET | Freq: Every day | ORAL | Status: DC
  Administered 2018-02-04 – 2018-02-23 (×20): 150 ug via ORAL
  Filled 2018-02-04 (×21): qty 2

## 2018-02-04 MED ORDER — POLYETHYLENE GLYCOL 3350 17 G PO PACK
17.0000 g | PACK | Freq: Every day | ORAL | Status: DC
  Administered 2018-02-05 – 2018-02-23 (×19): 17 g via ORAL
  Filled 2018-02-04 (×18): qty 1

## 2018-02-04 MED ORDER — SODIUM CHLORIDE 0.9 % IV SOLN
INTRAVENOUS | Status: DC
  Administered 2018-02-04 – 2018-02-07 (×7): via INTRAVENOUS

## 2018-02-04 MED ORDER — BACLOFEN 10 MG PO TABS
25.0000 mg | ORAL_TABLET | Freq: Four times a day (QID) | ORAL | Status: DC | PRN
  Administered 2018-02-04 – 2018-02-21 (×15): 25 mg via ORAL
  Filled 2018-02-04 (×16): qty 3

## 2018-02-04 MED ORDER — SODIUM CHLORIDE 0.9 % IV SOLN: 1.0000 g | INTRAVENOUS | Status: DC

## 2018-02-04 MED ORDER — SUCRALFATE 1 G PO TABS
1.0000 g | ORAL_TABLET | Freq: Three times a day (TID) | ORAL | Status: DC
  Administered 2018-02-04 – 2018-02-05 (×6): 1 g via ORAL
  Filled 2018-02-04 (×6): qty 1

## 2018-02-04 MED ORDER — SENNA 8.6 MG PO TABS
1.0000 | ORAL_TABLET | Freq: Two times a day (BID) | ORAL | Status: DC
  Administered 2018-02-04 – 2018-02-23 (×37): 8.6 mg via ORAL
  Filled 2018-02-04 (×37): qty 1

## 2018-02-04 MED ORDER — SODIUM CHLORIDE 0.9 % IV SOLN
500.0000 mg | INTRAVENOUS | Status: DC
  Administered 2018-02-04 – 2018-02-05 (×2): 500 mg via INTRAVENOUS
  Filled 2018-02-04 (×5): qty 500

## 2018-02-04 MED ORDER — TIZANIDINE HCL 2 MG PO TABS: 2.0000 mg | ORAL_TABLET | Freq: Three times a day (TID) | ORAL | Status: DC | PRN

## 2018-02-04 MED ORDER — CHLORHEXIDINE GLUCONATE CLOTH 2 % EX PADS
6.0000 | MEDICATED_PAD | Freq: Every day | CUTANEOUS | Status: DC
  Administered 2018-02-04 – 2018-02-05 (×2): 6 via TOPICAL

## 2018-02-04 MED ORDER — RIVAROXABAN 20 MG PO TABS
20.0000 mg | ORAL_TABLET | Freq: Every day | ORAL | Status: DC
  Administered 2018-02-04 – 2018-02-14 (×11): 20 mg via ORAL
  Filled 2018-02-04 (×11): qty 1

## 2018-02-04 MED ORDER — FAMOTIDINE 20 MG PO TABS
20.0000 mg | ORAL_TABLET | Freq: Two times a day (BID) | ORAL | Status: DC
  Administered 2018-02-04 – 2018-02-23 (×39): 20 mg via ORAL
  Filled 2018-02-04 (×39): qty 1

## 2018-02-04 MED ORDER — ORAL CARE MOUTH RINSE
15.0000 mL | Freq: Two times a day (BID) | OROMUCOSAL | Status: DC
  Administered 2018-02-04 – 2018-02-23 (×36): 15 mL via OROMUCOSAL

## 2018-02-04 MED ORDER — CHLORHEXIDINE GLUCONATE CLOTH 2 % EX PADS
6.0000 | MEDICATED_PAD | Freq: Every day | CUTANEOUS | Status: DC
  Administered 2018-02-04 – 2018-02-23 (×20): 6 via TOPICAL

## 2018-02-04 MED ORDER — SERTRALINE HCL 50 MG PO TABS
50.0000 mg | ORAL_TABLET | Freq: Every day | ORAL | Status: DC
  Administered 2018-02-04 – 2018-02-23 (×20): 50 mg via ORAL
  Filled 2018-02-04 (×20): qty 1

## 2018-02-04 MED ORDER — PANTOPRAZOLE SODIUM 40 MG PO TBEC
40.0000 mg | DELAYED_RELEASE_TABLET | Freq: Every day | ORAL | Status: DC
  Administered 2018-02-04 – 2018-02-23 (×20): 40 mg via ORAL
  Filled 2018-02-04 (×21): qty 1

## 2018-02-04 MED ORDER — MUPIROCIN 2 % EX OINT
1.0000 "application " | TOPICAL_OINTMENT | Freq: Two times a day (BID) | CUTANEOUS | Status: AC
  Administered 2018-02-04 – 2018-02-08 (×10): 1 via NASAL
  Filled 2018-02-04: qty 22

## 2018-02-04 MED ORDER — ACETAMINOPHEN 325 MG PO TABS
650.0000 mg | ORAL_TABLET | ORAL | Status: DC | PRN
  Administered 2018-02-04 – 2018-02-23 (×3): 650 mg via ORAL
  Filled 2018-02-04 (×3): qty 2

## 2018-02-04 MED ORDER — LORATADINE 10 MG PO TABS
10.0000 mg | ORAL_TABLET | Freq: Every day | ORAL | Status: DC
  Administered 2018-02-04 – 2018-02-23 (×20): 10 mg via ORAL
  Filled 2018-02-04 (×20): qty 1

## 2018-02-04 MED ORDER — SODIUM CHLORIDE 0.9 % IV SOLN
1.0000 g | INTRAVENOUS | Status: DC
  Administered 2018-02-04 – 2018-02-05 (×2): 1 g via INTRAVENOUS
  Filled 2018-02-04 (×2): qty 10
  Filled 2018-02-04 (×2): qty 1

## 2018-02-04 NOTE — Progress Notes (Signed)
02/03/2018  8:36 PM  02/04/2018 8:52 AM  Sarah [name] was seen and examined.  The H&P by the admitting provider, orders, imaging was reviewed.  Please see new orders.  Will continue to follow.  Pt hg down to 7.8.  Will get anemia panel. Hemoccult stools.  Type and screen.  Follow.    Vitals:   02/04/18 0750 02/04/18 0800  BP:  (!) 89/61  Pulse: 70 73  Resp: 18 (!) 23  Temp: (!) 97.2 F (36.2 C)   SpO2: 97% 100%   Results for orders placed or performed during the hospital encounter of 02/03/18  Blood culture (routine x 2)  Result Value Ref Range   Specimen Description BLOOD LEFT HAND    Special Requests      BOTTLES DRAWN AEROBIC AND ANAEROBIC Blood Culture adequate volume   Culture      NO GROWTH < 12 HOURS Performed at [facility], [address] [place], [place] [address]    Report Status PENDING   Blood culture (routine x 2)  Result Value Ref Range   Specimen Description RIGHT ANTECUBITAL    Special Requests      BOTTLES DRAWN AEROBIC AND ANAEROBIC Blood Culture adequate volume   Culture      NO GROWTH < 12 HOURS Performed at [facility], [address], [place], [place] [address]    Report Status PENDING   Basic metabolic panel  Result Value Ref Range   Sodium 134 (L) 135 - 145 mmol/L   Potassium 3.6 3.5 - 5.1 mmol/L   Chloride 103 98 - 111 mmol/L   CO2 24 22 - 32 mmol/L   Glucose, Bld 110 (H) 70 - 99 mg/dL   BUN 19 6 - 20 mg/dL   Creatinine, Ser 0.93 0.44 - 1.00 mg/dL   Calcium 7.5 (L) 8.9 - 10.3 mg/dL   GFR calc non Af Amer >60 >60 mL/min   GFR calc Af Amer >60 >60 mL/min   Anion gap 7 5 - 15  CBC with Differential/Platelet  Result Value Ref Range   WBC 5.2 4.0 - 10.5 K/uL   RBC 3.15 (L) 3.87 - 5.11 MIL/uL   Hemoglobin 9.0 (L) 12.0 - 15.0 g/dL   HCT 29.6 (L) 36.0 - 46.0 %   MCV 94.0 80.0 - 100.0 fL   MCH 28.6 26.0 - 34.0 pg   MCHC 30.4 30.0 - 36.0 g/dL   RDW 14.2 11.5 - 15.5 %   Platelets 192 150 - 400 K/uL   nRBC 0.0 0.0 - 0.2 %   Neutrophils  Relative % 85 %   Neutro Abs 4.4 1.7 - 7.7 K/uL   Lymphocytes Relative 10 %   Lymphs Abs 0.5 (L) 0.7 - 4.0 K/uL   Monocytes Relative 4 %   Monocytes Absolute 0.2 0.1 - 1.0 K/uL   Eosinophils Relative 0 %   Eosinophils Absolute 0.0 0.0 - 0.5 K/uL   Basophils Relative 0 %   Basophils Absolute 0.0 0.0 - 0.1 K/uL   Immature Granulocytes 1 %   Abs Immature Granulocytes 0.03 0.00 - 0.07 K/uL  CBC  Result Value Ref Range   WBC 7.4 4.0 - 10.5 K/uL   RBC 2.70 (L) 3.87 - 5.11 MIL/uL   Hemoglobin 7.8 (L) 12.0 - 15.0 g/dL   HCT 25.5 (L) 36.0 - 46.0 %   MCV 94.4 80.0 - 100.0 fL   MCH 28.9 26.0 - 34.0 pg   MCHC 30.6 30.0 - 36.0 g/dL  RDW 14.1 11.5 - 15.5 %   Platelets 170 150 - 400 K/uL   nRBC 0.0 0.0 - 0.2 %  Comprehensive metabolic panel  Result Value Ref Range   Sodium 135 135 - 145 mmol/L   Potassium 4.5 3.5 - 5.1 mmol/L   Chloride 103 98 - 111 mmol/L   CO2 26 22 - 32 mmol/L   Glucose, Bld 102 (H) 70 - 99 mg/dL   BUN 19 6 - 20 mg/dL   Creatinine, Ser 0.90 0.44 - 1.00 mg/dL   Calcium 7.4 (L) 8.9 - 10.3 mg/dL   Total Protein 4.9 (L) 6.5 - 8.1 g/dL   Albumin 1.9 (L) 3.5 - 5.0 g/dL   AST 12 (L) 15 - 41 U/L   ALT 8 0 - 44 U/L   Alkaline Phosphatase 114 38 - 126 U/L   Total Bilirubin 0.6 0.3 - 1.2 mg/dL   GFR calc non Af Amer >60 >60 mL/min   GFR calc Af Amer >60 >60 mL/min   Anion gap 6 5 - 15  Vitamin B12  Result Value Ref Range   Vitamin B-12 219 180 - 914 pg/mL  Folate  Result Value Ref Range   Folate 6.9 >5.9 ng/mL  Iron and TIBC  Result Value Ref Range   Iron 10 (L) 28 - 170 ug/dL   TIBC 198 (L) 250 - 450 ug/dL   Saturation Ratios 5 (L) 10.4 - 31.8 %   UIBC 188 ug/dL  Ferritin  Result Value Ref Range   Ferritin 112 11 - 307 ng/mL  Reticulocytes  Result Value Ref Range   Retic Ct Pct 1.9 0.4 - 3.1 %   RBC. 2.67 (L) 3.87 - 5.11 MIL/uL   Retic Count, Absolute 49.7 19.0 - 186.0 K/uL   Immature Retic Fract 17.4 (H) 2.3 - 15.9 %  I-Stat CG4 Lactic Acid, ED  Result  Value Ref Range   Lactic Acid, Venous 1.02 0.5 - 1.9 mmol/L   *Note: Due to a large number of results and/or encounters for the requested time period, some results have not been displayed. A complete set of results can be found in Results Review.     Murvin Natal, MD [facility]

## 2018-02-04 NOTE — H&P (Addendum)
TRH H&P    Patient Demographics:    Sarah [name], is a 52 y.o. female  MRN: [id number]  DOB - 06/07/1965  Admit Date - 02/03/2018  Referring MD/NP/PA: Dr Winfred Leeds  Outpatient Primary MD for the patient is [name], MD  Patient coming from: Home   Chief complaint- Shortness of breath   HPI:    Sarah [name]  is a 52 y.o. 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  Patient states that symptoms have been going on for past 3 days.  She also complains of nausea but no vomiting.  Denies any fever.  Denies chest pain. In the ED, chest x-ray showed masslike opacity in right upper lung likely infectious given the rapid development since December 13, 2017.  Also infiltrate in left base likely pneumonia. Patient started on ceftriaxone and Zithromax.  Blood cultures x2 obtained. Denies previous history of stroke or seizures. No history of CAD.    Review of systems:    In addition to the HPI above,    All other systems reviewed and are negative.   With Past History of the following :    Past Medical History:  Diagnosis Date  . 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 Chronic back pain   . Complication of anesthesia    Nausea and vomiting  . DDD (degenerative disc disease), cervical   . DJD (degenerative joint disease)   . Family history of adverse reaction to anesthesia    MOTHER HAS NAUSEA   . GERD (gastroesophageal reflux disease)   . History of rectal fissure   . HIT (heparin-induced thrombocytopenia) ([place])   . Hypertension   . Hypothyroidism   . Obesity   . Paraplegia at T4 level ([place])   . Pulmonary embolus, right ([place]) 2015  . Rotator  cuff tendonitis right      Past Surgical History:  Procedure Laterality Date  . CHOLECYSTECTOMY N/A 01/25/2018   Procedure: LAPAROSCOPIC CHOLECYSTECTOMY;  Surgeon: Virl Cagey, MD;  Location: [facility];  Service: General;  Laterality: N/A;  . FEMUR IM NAIL Left 09/11/2017   Procedure: INTRAMEDULLARY (IM) RETROGRADE FEMORAL NAILING;  Surgeon: Altamese [place], MD;  Location: [place];  Service: Orthopedics;  Laterality: Left;  . LAMINECTOMY N/A 12/14/2013   Procedure: THORACIC LAMINECTOMY FOR TUMOR THORACIC THREE;  Surgeon: Ashok Pall, MD;  Location: [place] [facility];  Service: Neurosurgery;  Laterality: N/A;  THORACIC LAMINECTOMY FOR TUMOR THORACIC THREE  . LAMINECTOMY N/A 07/05/2015   Procedure: THORACIC LAMINECTOMY FOR TUMOR;  Surgeon: Ashok Pall, MD;  Location: [place] [facility];  Service: Neurosurgery;  Laterality: N/A;  . LAMINECTOMY N/A 09/03/2017   Procedure: POSTERIOR SPINAL TUMOR RESECTION THORACIC THREE;  Surgeon: Ashok Pall, MD;  Location: [place];  Service: Neurosurgery;  Laterality: N/A;  POSTERIOR SPINAL TUMOR RESECTION THORACIC THREE  . POSTERIOR  LUMBAR FUSION 4 LEVEL N/A 12/30/2013   Procedure: Thoracic one-Thoracic five posterior thoracic fusion with pedicle screws;  Surgeon: Ashok Pall, MD;  Location: [place] [facility];  Service: Neurosurgery;  Laterality: N/A;  Thoracic one-Thoracic five posterior thoracic fusion with pedicle screws  . THYROIDECTOMY N/A 01/09/2014   Procedure: TOTAL THYROIDECTOMY;  Surgeon: Izora Gala, MD;  Location: [place];  Service: ENT;  Laterality: N/A;  . TONSILLECTOMY        Social History:      Social History   Tobacco Use  . Smoking status: Never Smoker  . Smokeless tobacco: Never Used  Substance Use Topics  . Alcohol use: No       Family History :     Family History  Problem Relation Age of Onset  . Hypertension Mother   . Diabetes Mother   . Hypertension Father   . Stroke Father   . Diabetes Father   . Diabetes Sister   . Diabetes Sister        Home Medications:   Prior to Admission medications   Medication Sig Start Date End Date Taking? Authorizing Provider  acetaminophen (TYLENOL) 325 MG tablet Take 2 tablets (650 mg total) by mouth every 4 (four) hours as needed for mild pain ((score 1 to 3) or temp > 100.5). 09/11/17  Yes Angiulli, Lavon Paganini, PA-C  baclofen (LIORESAL) 10 MG tablet Take 25 mg by mouth 4 (four) times daily as needed for muscle spasms.  01/05/18  Yes [provider]  famotidine (PEPCID) 20 MG tablet Take 1 tablet (20 mg total) by mouth 2 (two) times daily. 09/21/17  Yes Emokpae, Courage, MD  fluticasone (FLONASE) 50 MCG/ACT nasal spray Place 2 sprays into both nostrils daily. 04/01/17  Yes Carlyle Dolly, MD  levothyroxine (SYNTHROID, LEVOTHROID) 150 MCG tablet Take 150 mcg by mouth daily before breakfast.  01/06/18  Yes [provider]  loratadine (CLARITIN) 10 MG tablet Take 1 tablet (10 mg total) by mouth daily. 10/30/17  Yes Angiulli, Lavon Paganini, PA-C  ondansetron (ZOFRAN) 4 MG tablet Take 1 tablet by mouth every 6 (six) hours as needed for nausea or vomiting.  07/25/14  Yes [provider]  oxyCODONE (ROXICODONE) 5 MG immediate release tablet Take 1 tablet (5 mg total) by mouth every 4 (four) hours as needed. 01/25/18 01/25/19 Yes Virl Cagey, MD  oxyCODONE-acetaminophen (PERCOCET/ROXICET) 5-325 MG tablet Take 0.5-1 tablets by mouth every 6 (six) hours as needed for severe pain.   Yes [provider]  pantoprazole (PROTONIX) 40 MG tablet Take 1 tablet by mouth daily. 01/17/15  Yes [provider]  polyethylene glycol (MIRALAX / GLYCOLAX) packet Take 17 g by mouth daily. 10/30/17  Yes Angiulli, Lavon Paganini, PA-C  rivaroxaban (XARELTO) 20 MG TABS tablet Take 20 mg by mouth daily with supper.   Yes [provider]  senna (SENOKOT) 8.6 MG TABS tablet Take 1 tablet (8.6 mg total) by mouth 2 (two) times daily. 10/29/17  Yes Angiulli, Lavon Paganini, PA-C  sertraline  (ZOLOFT) 50 MG tablet  12/24/17  Yes [provider]  sucralfate (CARAFATE) 1 g tablet Take 1 tablet (1 g total) by mouth 4 (four) times daily -  with meals and at bedtime. Please dissolve tablet in 10-15 mL of water to create a slurry. Patient should drink 10-15 minutes before each meals and at bedtime to reduce painful swallowing. 11/13/17  Yes Tyler Pita, MD  tiZANidine (ZANAFLEX) 2 MG tablet Take 2 mg by mouth every  8 (eight) hours as needed for muscle spasms.  01/06/18  Yes [provider]  sodium phosphate (FLEET) 7-19 GM/118ML ENEM Place 133 mLs (1 enema total) rectally daily as needed for severe constipation. Patient not taking: Reported on 01/05/2018 10/29/17   Angiulli, Lavon Paganini, PA-C  sucralfate (CARAFATE) 1 GM/10ML suspension Take 10 mLs (1 g total) by mouth 4 (four) times daily -  with meals and at bedtime. Patient not taking: Reported on 02/03/2018 10/29/17   Cathlyn Parsons, PA-C     Allergies:     Allergies  Allergen Reactions  . Bee Venom Anaphylaxis  . Keflex [Cephalexin] Nausea And Vomiting  . Tramadol Nausea And Vomiting  . Gabapentin Nausea And Vomiting  . Heparin Other (See Comments)    Weak positive platelets induced antibodies, SRA negative--2015  . Hydrochlorothiazide Nausea And Vomiting  . Hydrocodone Nausea And Vomiting  . Oxycodone-Acetaminophen Nausea And Vomiting  . Sorbitol Nausea Only    And abdominal distress  . Tizanidine     Fatigue      Physical Exam:   Vitals  Blood pressure (!) 88/64, pulse 76, temperature 98 F (36.7 C), temperature source Oral, resp. rate 20, height _0  (1.803 m), weight 99.8 kg, last menstrual period 10/22/2016, SpO2 93 %.  1.  General: Appears in no acute distress  2. Psychiatric:  Intact judgement and  insight, awake alert, oriented x 3.  3. Neurologic: Paraplegia  4. Eyes :  anicteric sclerae, moist conjunctivae with no lid lag. PERRLA.  5. ENMT:  Oropharynx clear with moist mucous  membranes and good dentition  6. Neck:  supple, no cervical lymphadenopathy appriciated, No thyromegaly  7. Respiratory : Normal respiratory effort, decreased breath sounds bilaterally  8. Cardiovascular : RRR, no gallops, rubs or murmurs, bilateral 1+ pitting edema  9. Gastrointestinal:  Positive bowel sounds, abdomen soft, non-tender to palpation,no hepatosplenomegaly, no rigidity or guarding       10. Skin:  No cyanosis, normal texture and turgor, no rash, lesions or ulcers  11.Musculoskeletal:  Good muscle tone,  joints appear normal , no effusions,  normal range of motion    Data Review:    CBC Recent Labs  Lab 02/03/18 2127  WBC 5.2  HGB 9.0*  HCT 29.6*  PLT 192  MCV 94.0  MCH 28.6  MCHC 30.4  RDW 14.2  LYMPHSABS 0.5*  MONOABS 0.2  EOSABS 0.0  BASOSABS 0.0   ------------------------------------------------------------------------------------------------------------------  Results for orders placed or performed during the hospital encounter of 02/03/18 (from the past 48 hour(s))  Basic metabolic panel     Status: Abnormal   Collection Time: 02/03/18  9:27 PM  Result Value Ref Range   Sodium 134 (L) 135 - 145 mmol/L   Potassium 3.6 3.5 - 5.1 mmol/L   Chloride 103 98 - 111 mmol/L   CO2 24 22 - 32 mmol/L   Glucose, Bld 110 (H) 70 - 99 mg/dL   BUN 19 6 - 20 mg/dL   Creatinine, Ser 0.93 0.44 - 1.00 mg/dL   Calcium 7.5 (L) 8.9 - 10.3 mg/dL   GFR calc non Af Amer >60 >60 mL/min   GFR calc Af Amer >60 >60 mL/min    Comment: (NOTE) The eGFR has been calculated using the CKD EPI equation. This calculation has not been validated in all clinical situations. eGFR's persistently <60 mL/min signify possible Chronic Kidney Disease.    Anion gap 7 5 - 15    Comment: Performed at [facility],  [address], [place], [place] [address]  CBC with Differential/Platelet     Status: Abnormal   Collection Time: 02/03/18  9:27 PM  Result Value Ref Range   WBC 5.2 4.0 -  10.5 K/uL   RBC 3.15 (L) 3.87 - 5.11 MIL/uL   Hemoglobin 9.0 (L) 12.0 - 15.0 g/dL   HCT 29.6 (L) 36.0 - 46.0 %   MCV 94.0 80.0 - 100.0 fL   MCH 28.6 26.0 - 34.0 pg   MCHC 30.4 30.0 - 36.0 g/dL   RDW 14.2 11.5 - 15.5 %   Platelets 192 150 - 400 K/uL   nRBC 0.0 0.0 - 0.2 %   Neutrophils Relative % 85 %   Neutro Abs 4.4 1.7 - 7.7 K/uL   Lymphocytes Relative 10 %   Lymphs Abs 0.5 (L) 0.7 - 4.0 K/uL   Monocytes Relative 4 %   Monocytes Absolute 0.2 0.1 - 1.0 K/uL   Eosinophils Relative 0 %   Eosinophils Absolute 0.0 0.0 - 0.5 K/uL   Basophils Relative 0 %   Basophils Absolute 0.0 0.0 - 0.1 K/uL   Immature Granulocytes 1 %   Abs Immature Granulocytes 0.03 0.00 - 0.07 K/uL    Comment: Performed at [facility], [address] [place], [place] [address]  I-Stat CG4 Lactic Acid, ED     Status: None   Collection Time: 02/03/18  9:36 PM  Result Value Ref Range   Lactic Acid, Venous 1.02 0.5 - 1.9 mmol/L  Blood culture (routine x 2)     Status: None (Preliminary result)   Collection Time: 02/03/18 10:34 PM  Result Value Ref Range   Specimen Description BLOOD LEFT HAND    Special Requests      BOTTLES DRAWN AEROBIC AND ANAEROBIC Blood Culture adequate volume Performed at [facility], [address], [place], [place] [address]    Culture PENDING    Report Status PENDING   Blood culture (routine x 2)     Status: None (Preliminary result)   Collection Time: 02/03/18 10:46 PM  Result Value Ref Range   Specimen Description RIGHT ANTECUBITAL    Special Requests      BOTTLES DRAWN AEROBIC AND ANAEROBIC Blood Culture adequate volume Performed at [facility], [address] [place], [place] [address]    Culture PENDING    Report Status PENDING    *Note: Due to a large number of results and/or encounters for the requested time period, some results have not been displayed. A complete set of results can be found in Results Review.    Chemistries  Recent Labs  Lab 02/03/18 2127  NA 134*   K 3.6  CL 103  CO2 24  GLUCOSE 110*  BUN 19  CREATININE 0.93  CALCIUM 7.5*   ------------------------------------------------------------------------------------------------------------------  ------------------------------------------------------------------------------------------------------------------ GFR: Estimated Creatinine Clearance: 93.1 mL/min (by C-G formula based on SCr of 0.93 mg/dL). Liver Function Tests: No results for input(s): AST, ALT, ALKPHOS, BILITOT, PROT, ALBUMIN in the last 168 hours. No results for input(s): LIPASE, AMYLASE in the last 168 hours. No results for input(s): AMMONIA in the last 168 hours. Coagulation Profile: No results for input(s): INR, PROTIME in the last 168 hours. Cardiac Enzymes: No results for input(s): CKTOTAL, CKMB, CKMBINDEX, TROPONINI in the last 168 hours. BNP (last 3 results) No results for input(s): PROBNP in the last 8760 hours. HbA1C: No results for input(s): HGBA1C in the last 72 hours. CBG: No results for input(s): GLUCAP in the last 168 hours. Lipid Profile:  No results for input(s): CHOL, HDL, LDLCALC, TRIG, CHOLHDL, LDLDIRECT in the last 72 hours. Thyroid Function Tests: No results for input(s): TSH, T4TOTAL, FREET4, T3FREE, THYROIDAB in the last 72 hours. Anemia Panel: No results for input(s): VITAMINB12, FOLATE, FERRITIN, TIBC, IRON, RETICCTPCT in the last 72 hours.     Imaging Results:    Dg Chest 2 View  Result Date: 02/03/2018 CLINICAL DATA:  Cough. EXAM: CHEST - 2 VIEW COMPARISON:  December 13, 2017 FINDINGS: There is a masslike opacity in the right upper lung, not seen in September 2019. There is infiltrate in the left retrocardiac region as well. No other interval changes or acute abnormalities. IMPRESSION: 1. The masslike opacity in the right upper lung is most likely infectious given the rapid development since December 13, 2017. There is also infiltrate in the left base, likely pneumonia. Recommend  short-term follow-up after treatment to ensure resolution. Electronically Signed   By: Dorise Bullion III M.D   On: 02/03/2018 22:03       Assessment & Plan:    Active Problems:   CAP (community acquired pneumonia)   1. Community-acquired pneumonia-chest x-ray shows masslike opacity, likely infectious as it developed quickly since December 13, 2017.  Started on ceftriaxone and Zithromax.  Patient will need repeat chest x-ray in 2 to 3 weeks to check further resolution of pneumonia.  2. History of pulmonary embolism-continue Xarelto  3. 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 Hypothyroidism-continue Synthroid    DVT Prophylaxis-   Xarelto  AM Labs Ordered, also please review Full Orders  Family Communication: Admission, patients condition and plan of care including tests being ordered have been discussed with the patient who indicate understanding and agree with the plan and Code Status.  Code Status:  Full code  Admission status: Inpatient: Based on patients clinical presentation and evaluation of above clinical data, I have made determination that patient meets Inpatient criteria at this time.  Patient requiring IV antibiotics for community-acquired pneumonia.  Time spent in minutes : 60 min   Oswald Hillock M.D on 02/04/2018 at 4:02 AM  Between 7am to 7pm - Pager - [phone]. After 7pm go to [email or web address] - password [facility]  [facility] - Office  [phone]

## 2018-02-04 NOTE — Progress Notes (Signed)
02/04/2018 12:39 PM  Pt having soft blood pressures.  She remains asymptomatic.  Increase rate of IVFs and follow. Treating infection. If she becomes symptomatic would be more aggressive and consider pressor support.  Will ask for PICC line placement in case pressors required.    Murvin Natal MD

## 2018-02-04 NOTE — Progress Notes (Signed)
Advised Dr. Wynetta Emery of patient's hypotension per automatic cuff. Obtained manual BP of 90/58. Patient's BP low at baseline, however, increased IVF to 149ml/hr. Will continue to monitor.

## 2018-02-04 NOTE — Progress Notes (Signed)
Peripherally Inserted Central Catheter/Midline Placement  The IV Nurse has discussed with the patient and/or persons authorized to consent for the patient, the purpose of this procedure and the potential benefits and risks involved with this procedure.  The benefits include less needle sticks, lab draws from the catheter, and the patient may be discharged home with the catheter. Risks include, but not limited to, infection, bleeding, blood clot (thrombus formation), and puncture of an artery; nerve damage and irregular heartbeat and possibility to perform a PICC exchange if needed/ordered by physician.  Alternatives to this procedure were also discussed.  Bard Power PICC patient education guide, fact sheet on infection prevention and patient information card has been provided to patient /or left at bedside.    PICC/Midline Placement Documentation  PICC Double Lumen 02/04/18 PICC Right Brachial 40 cm 0 cm (Active)  Indication for Insertion or Continuance of Line Prolonged intravenous therapies 02/04/2018  7:54 PM  Exposed Catheter (cm) 0 cm 02/04/2018  7:54 PM  Site Assessment Clean;Dry;Intact 02/04/2018  7:54 PM  Lumen #1 Status Flushed;Blood return noted 02/04/2018  7:54 PM  Lumen #2 Status Flushed;Blood return noted;Saline locked 02/04/2018  7:54 PM  Dressing Type Transparent 02/04/2018  7:54 PM  Dressing Status Clean;Dry;Intact 02/04/2018  7:54 PM  Dressing Change Due 02/11/18 02/04/2018  7:54 PM       [address] 02/04/2018, 7:56 PM

## 2018-02-05 ENCOUNTER — Inpatient Hospital Stay (HOSPITAL_COMMUNITY): Payer: Medicare Other

## 2018-02-05 ENCOUNTER — Encounter (HOSPITAL_COMMUNITY): Payer: Self-pay | Admitting: Family Medicine

## 2018-02-05 DIAGNOSIS — G8220 Paraplegia, unspecified: Secondary | ICD-10-CM

## 2018-02-05 DIAGNOSIS — J189 Pneumonia, unspecified organism: Principal | ICD-10-CM

## 2018-02-05 DIAGNOSIS — Z7901 Long term (current) use of anticoagulants: Secondary | ICD-10-CM

## 2018-02-05 DIAGNOSIS — D649 Anemia, unspecified: Secondary | ICD-10-CM

## 2018-02-05 LAB — CBC
HCT: 21.3 % — ABNORMAL LOW (ref 36.0–46.0)
HEMOGLOBIN: 6.6 g/dL — AB (ref 12.0–15.0)
MCH: 29.3 pg (ref 26.0–34.0)
MCHC: 31 g/dL (ref 30.0–36.0)
MCV: 94.7 fL (ref 80.0–100.0)
Platelets: 164 10*3/uL (ref 150–400)
RBC: 2.25 MIL/uL — AB (ref 3.87–5.11)
RDW: 14.2 % (ref 11.5–15.5)
WBC: 5.8 10*3/uL (ref 4.0–10.5)
nRBC: 0 % (ref 0.0–0.2)

## 2018-02-05 LAB — BASIC METABOLIC PANEL
ANION GAP: 5 (ref 5–15)
BUN: 17 mg/dL (ref 6–20)
CHLORIDE: 106 mmol/L (ref 98–111)
CO2: 24 mmol/L (ref 22–32)
Calcium: 6.8 mg/dL — ABNORMAL LOW (ref 8.9–10.3)
Creatinine, Ser: 0.78 mg/dL (ref 0.44–1.00)
GFR calc non Af Amer: >60 mL/min (ref >60)
Glucose, Bld: 86 mg/dL (ref 70–99)
POTASSIUM: 3.4 mmol/L — AB (ref 3.5–5.1)
SODIUM: 135 mmol/L (ref 135–145)

## 2018-02-05 LAB — ABO/RH: ABO/RH(D): O POS

## 2018-02-05 LAB — PREPARE RBC (CROSSMATCH)

## 2018-02-05 LAB — MAGNESIUM: Magnesium: 1.7 mg/dL (ref 1.7–2.4)

## 2018-02-05 MED ORDER — SODIUM CHLORIDE 0.9% IV SOLUTION: Freq: Once | INTRAVENOUS | Status: DC

## 2018-02-05 MED ORDER — POTASSIUM CHLORIDE CRYS ER 20 MEQ PO TBCR
40.0000 meq | EXTENDED_RELEASE_TABLET | Freq: Once | ORAL | Status: AC
  Administered 2018-02-05: 40 meq via ORAL
  Filled 2018-02-05: qty 2

## 2018-02-05 MED ORDER — ALBUTEROL SULFATE (2.5 MG/3ML) 0.083% IN NEBU
2.5000 mg | INHALATION_SOLUTION | Freq: Four times a day (QID) | RESPIRATORY_TRACT | Status: DC | PRN
  Administered 2018-02-05 – 2018-02-12 (×5): 2.5 mg via RESPIRATORY_TRACT
  Filled 2018-02-05 (×5): qty 3

## 2018-02-05 NOTE — Progress Notes (Addendum)
PROGRESS NOTE    Sarah [name]  NUU:[id number]  DOB: 04-04-1965  DOA: 02/03/2018 PCP: [name], MD   Brief Admission Hx: 52 y.o. 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  Patient states that symptoms have been going on for past 3 days.  She also complains of nausea but no vomiting.  Denies any fever.  Denies chest pain. In the ED, chest x-ray showed masslike opacity in right upper lung likely infectious given the rapid development since December 13, 2017.  Also infiltrate in left base likely pneumonia. Patient started on ceftriaxone and Zithromax.  Blood cultures x2 obtained. Denies previous history of stroke or seizures. No history of CAD.  MDM/Assessment & Plan:    1. Community-acquired pneumonia-chest x-ray shows masslike opacity, likely infectious as it developed quickly since December 13, 2017.  Started on ceftriaxone and Zithromax.  Patient will need repeat chest x-ray in 2 to 3 weeks to check further resolution of pneumonia.  Continue supportive care.  Pt says that she is feeling a little better today.  Blood cultures NGTD.  Check strep pneumo and legionella.   2. History of pulmonary embolism-continue Xarelto  3. 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 Pt has been treated with radiation and chemotherapy.  Follow up with oncology when discharged.    4. Hypothyroidism-continue Synthroid 5. Hypotension - soft blood pressures likely multifactorial from dehydration, infection, anemia, paraplegia.  Pt is asymptomatic and MAP>65.  Will follow closely in SDU.   6. Anemia - unspecified.  Hg down with hydration.  Transfuse 2 units PRBC.  With patient committed to lifelong xarelto will ask for GI opinion about urgency of  endoscopy inpatient versus outpatient. Transfuse as needed.  I asked for stool to be hemoccult tested.  7. Hypokalemia - check Mg and replace as needed.  Follow.  8. Poor IV access - pICC line in place.  9. Stage 2 sacral pressure injury and stage 1 left heel injury - wound care protocol  DVT Prophylaxis-   Xarelto  AM Labs Ordered, also please review Full Orders  Family Communication: Admission, patients condition and plan of care including tests being ordered have been discussed with the patient who indicate understanding and agree with the plan and Code Status.  Code Status:  Full code   Consultants:  GI   Antimicrobials:  Ceftriaxone 11/14  Azithromycin 11/14  Pressure Injury Documentation: Pressure Injury 02/04/18 Stage II -  Partial thickness loss of dermis presenting as a shallow open ulcer with a red, pink wound bed without slough. (Active)  02/04/18 0130   Location: Sacrum  Location Orientation: Mid  Staging: Stage II -  Partial thickness loss of dermis presenting as a shallow open ulcer with a red, pink wound bed without slough.  Wound Description (Comments):   Present on Admission: Yes     Pressure Injury 02/04/18 Stage I -  Intact skin with non-blanchable redness of a localized area usually over a bony prominence. (Active)  02/04/18 0120   Location: Heel  Location Orientation: Left  Staging: Stage I -  Intact skin with non-blanchable redness of a localized area usually over a bony prominence.  Wound Description (Comments):   Present on Admission: Yes    Subjective: Pt says that she is starting to feel better she is having less  shortness of breath.   Objective: Vitals:   02/05/18 1300 02/05/18 1315 02/05/18 1345 02/05/18 1400  BP: (!) 88/57   90/62  Pulse: 82 83 82 84  Resp: (!) 21 (!) 27 (!) 24 (!) 22  Temp:  (!) 97.2 F (36.2 C)  (!) 96.8 F (36 C)  TempSrc:  Oral  Oral  SpO2: 99% 98% 98% 97%  Weight:      Height:        Intake/Output  Summary (Last 24 hours) at 02/05/2018 1455 Last data filed at 02/05/2018 1300 Gross per 24 hour  Intake 4854.46 ml  Output -  Net 4854.46 ml   Filed Weights   02/03/18 2035 02/04/18 0421  Weight: 99.8 kg (!) 180.5 kg     REVIEW OF SYSTEMS  As per history otherwise all reviewed and reported negative  Exam:  General exam: awake, alert, NAD cooperative.  Respiratory system: rales RUL. No increased work of breathing. Cardiovascular system: S1 & S2 heard, RRR. No JVD, murmurs, gallops, clicks or pedal edema. Gastrointestinal system: Abdomen is nondistended, soft and nontender. Normal bowel sounds heard. Central nervous system: Alert and oriented. Paraplegic.  Extremities: no cyanosis or clubbing.  Data Reviewed: Basic Metabolic Panel: Recent Labs  Lab 02/03/18 2127 02/04/18 0432 02/05/18 0456 02/05/18 0729  NA 134* 135 135  --   K 3.6 4.5 3.4*  --   CL 103 103 106  --   CO2 24 26 24   --   GLUCOSE 110* 102* 86  --   BUN 19 19 17   --   CREATININE 0.93 0.90 0.78  --   CALCIUM 7.5* 7.4* 6.8*  --   MG  --   --   --  1.7   Liver Function Tests: Recent Labs  Lab 02/04/18 0432  AST 12*  ALT 8  ALKPHOS 114  BILITOT 0.6  PROT 4.9*  ALBUMIN 1.9*   No results for input(s): LIPASE, AMYLASE in the last 168 hours. No results for input(s): AMMONIA in the last 168 hours. CBC: Recent Labs  Lab 02/03/18 2127 02/04/18 0432 02/05/18 0456  WBC 5.2 7.4 5.8  NEUTROABS 4.4  --   --   HGB 9.0* 7.8* 6.6*  HCT 29.6* 25.5* 21.3*  MCV 94.0 94.4 94.7  PLT 192 170 164   Cardiac Enzymes: No results for input(s): CKTOTAL, CKMB, CKMBINDEX, TROPONINI in the last 168 hours. CBG (last 3)  No results for input(s): GLUCAP in the last 72 hours. Recent Results (from the past 240 hour(s))  Blood culture (routine x 2)     Status: None (Preliminary result)   Collection Time: 02/03/18 10:34 PM  Result Value Ref Range Status   Specimen Description BLOOD LEFT HAND  Final   Special Requests    Final    BOTTLES DRAWN AEROBIC AND ANAEROBIC Blood Culture adequate volume   Culture   Final    NO GROWTH 2 DAYS Performed at [facility], [address], [place], [place] [address]    Report Status PENDING  Incomplete  Blood culture (routine x 2)     Status: None (Preliminary result)   Collection Time: 02/03/18 10:46 PM  Result Value Ref Range Status   Specimen Description RIGHT ANTECUBITAL  Final   Special Requests   Final    BOTTLES DRAWN AEROBIC AND ANAEROBIC Blood Culture adequate volume   Culture   Final    NO GROWTH 2 DAYS Performed at [facility], [address] [place], [place] [address]  Report Status PENDING  Incomplete  MRSA PCR Screening     Status: Abnormal   Collection Time: 02/04/18  1:30 AM  Result Value Ref Range Status   MRSA by PCR POSITIVE (A) NEGATIVE Final    Comment:        The GeneXpert MRSA Assay (FDA approved for NASAL specimens only), is one component of a comprehensive MRSA colonization surveillance program. It is not intended to diagnose MRSA infection nor to guide or monitor treatment for MRSA infections. RESULT CALLED TO, READ BACK BY AND VERIFIED WITH: Carlye Grippe R. AT [id number] ON [id number] BY THOMPSON S. Performed at [facility], [address], [place], [place] [address]      Studies: Dg Chest 2 View  Result Date: 02/03/2018 CLINICAL DATA:  Cough. EXAM: CHEST - 2 VIEW COMPARISON:  December 13, 2017 FINDINGS: There is a masslike opacity in the right upper lung, not seen in September 2019. There is infiltrate in the left retrocardiac region as well. No other interval changes or acute abnormalities. IMPRESSION: 1. The masslike opacity in the right upper lung is most likely infectious given the rapid development since December 13, 2017. There is also infiltrate in the left base, likely pneumonia. Recommend short-term follow-up after treatment to ensure resolution. Electronically Signed   By: Dorise Bullion III M.D   On: 02/03/2018 22:03   Dg  Chest Port 1 View  Result Date: 02/05/2018 CLINICAL DATA:  Pneumonia.  History of thyroid carcinoma EXAM: PORTABLE CHEST 1 VIEW COMPARISON:  February 03, 2018 and December 13, 2017 FINDINGS: There is increase in consolidation in the right upper lobe. There is new airspace consolidation in the left upper lobe. There is also patchy airspace consolidation in the right base, new. There is stable consolidation in the medial left base. Heart is upper normal in size with pulmonary vascularity normal. No appreciable adenopathy. Postoperative changes noted in the upper thoracic region. Trachea is midline. IMPRESSION: Multifocal pneumonia with progression compared to 2 days prior. Stable cardiac silhouette. Electronically Signed   By: Lowella Grip III M.D.   On: 02/05/2018 07:02     Scheduled Meds: . sodium chloride   Intravenous Once  . sodium chloride   Intravenous Once  . Chlorhexidine Gluconate Cloth  6 each Topical Daily  . famotidine  20 mg Oral BID  . levothyroxine  150 mcg Oral Q0600  . loratadine  10 mg Oral Daily  . mouth rinse  15 mL Mouth Rinse BID  . mupirocin ointment  1 application Nasal BID  . pantoprazole  40 mg Oral Daily  . polyethylene glycol  17 g Oral Daily  . rivaroxaban  20 mg Oral Q supper  . senna  1 tablet Oral BID  . sertraline  50 mg Oral Daily   Continuous Infusions: . sodium chloride 10 mL/hr at 02/05/18 1200  . azithromycin 500 mg (02/05/18 1333)  . cefTRIAXone (ROCEPHIN)  IV 1 g (02/04/18 2145)    Active Problems:   CAP (community acquired pneumonia)   Pressure injury of skin   Anemia   Chronic anticoagulation  Critical care Time spent: 35 mins  Irwin Brakeman, MD, FAAFP [facility] Pager [phone]  If 7PM-7AM, please contact night-coverage [email or web address] Password [email or web address] 02/05/2018, 2:55 PM    LOS: 1 day

## 2018-02-05 NOTE — Consult Note (Signed)
Referring Provider: [facility] Primary Care Physician:  [name], MD Primary Gastroenterologist:  Dr. Oneida Alar (previously unassigned)  Date of Admission: 02/03/18 Date of Consultation: 02/05/18  Reason for Consultation:  "anemia, possible GI bleed"  HPI:  Sarah [name] is a 52 y.o. 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  Hospitalist H&P as well as emergency room physician notes reviewed.  The patient came to the emergency room complaining of dyspnea and productive cough with yellow phlegm mixed with blood.  The symptoms were ongoing for 3 days.  Nausea but no vomiting.  Chest x-ray showed masslike opacity in the right upper lung likely infectious given the rapid development as well as infiltrate in the left base likely pneumonia.  She was started on ceftriaxone and Zithromax with blood cultures drawn initially.  The emergency room the patient was dyspneic with 87% saturation on 3 L/min nasal cannula which increased to 90% on 5 L/min.  The patient was noted to be anemic unchanged from 3 weeks prior.  Hemoglobin in the emergency department was 9.0 which declined to 7.8 yesterday.  Patient has been chronically anemic with last normal hemoglobin May of this year.  However, she was also anemic early to mid 2018.  Indices normal in the emergency department.  On 02/03/2018 her ferritin was normal, saturation low at 5% and iron low at 10.  CMP essentially normal for kidney function, LFTs, electrolytes.  Occult blood card ordered and awaiting collection.  Today she states she is having some minimal right upper quadrant/upper abdominal pain but she is status post lap chole about 11 days ago.  Otherwise no abdominal pain.  She denies any hematochezia or melena.  Thinks she may have had a bowel movement last night but unsure.  Minimal nausea but no vomiting.  Indicates she has  had multiple surgical procedures in the last 1 to 2 years including spine surgery for a tumor that left her paraplegic.  She feels like her breathing is a little bit better today.  She admits she has been anemic for some time.  No other GI symptoms.  Past Medical History:  Diagnosis Date  . 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 Chronic back pain   . Complication of anesthesia    Nausea and vomiting  . DDD (degenerative disc disease), cervical   . DJD (degenerative joint disease)   . Family history of adverse reaction to anesthesia    MOTHER HAS NAUSEA   . GERD (gastroesophageal reflux disease)   . History of rectal fissure   . HIT (heparin-induced thrombocytopenia) ([place])   . Hypertension   . Hypothyroidism   . Obesity   . Paraplegia at T4 level ([place])   . Pulmonary embolus, right ([place]) 2015  . Rotator cuff tendonitis right    Past Surgical History:  Procedure Laterality Date  . CHOLECYSTECTOMY N/A 01/25/2018   Procedure: LAPAROSCOPIC CHOLECYSTECTOMY;  Surgeon: Virl Cagey, MD;  Location: [facility];  Service: General;  Laterality: N/A;  . FEMUR IM NAIL Left 09/11/2017   Procedure: INTRAMEDULLARY (IM) RETROGRADE FEMORAL NAILING;  Surgeon: Altamese Manorhaven, MD;  Location: [place];  Service: Orthopedics;  Laterality: Left;  . LAMINECTOMY N/A 12/14/2013   Procedure: THORACIC LAMINECTOMY FOR TUMOR THORACIC THREE;  Surgeon: Ashok Pall, MD;  Location: [place] [facility];  Service: Neurosurgery;  Laterality: N/A;  THORACIC LAMINECTOMY FOR TUMOR  THORACIC THREE  . LAMINECTOMY N/A 07/05/2015   Procedure: THORACIC LAMINECTOMY FOR TUMOR;  Surgeon: Ashok Pall, MD;  Location: [place] [facility];  Service: Neurosurgery;  Laterality: N/A;  . LAMINECTOMY N/A 09/03/2017   Procedure: POSTERIOR SPINAL TUMOR RESECTION THORACIC THREE;  Surgeon: Ashok Pall, MD;  Location: [place];  Service: Neurosurgery;  Laterality: N/A;  POSTERIOR SPINAL TUMOR RESECTION THORACIC THREE  .  POSTERIOR LUMBAR FUSION 4 LEVEL N/A 12/30/2013   Procedure: Thoracic one-Thoracic five posterior thoracic fusion with pedicle screws;  Surgeon: Ashok Pall, MD;  Location: [place] [facility] OR[facility];  Service: Neurosurgery;  Laterality: N/A;  Thoracic one-Thoracic five posterior thoracic fusion with pedicle screws  . THYROIDECTOMY N/A 01/09/2014   Procedure: TOTAL THYROIDECTOMY;  Surgeon: Izora Gala, MD;  Location: [place];  Service: ENT;  Laterality: N/A;  . TONSILLECTOMY      Prior to Admission medications   Medication Sig Start Date End Date Taking? Authorizing Provider  acetaminophen (TYLENOL) 325 MG tablet Take 2 tablets (650 mg total) by mouth every 4 (four) hours as needed for mild pain ((score 1 to 3) or temp > 100.5). 09/11/17  Yes Angiulli, Lavon Paganini, PA-C  baclofen (LIORESAL) 10 MG tablet Take 25 mg by mouth 4 (four) times daily as needed for muscle spasms.  01/05/18  Yes [provider]  famotidine (PEPCID) 20 MG tablet Take 1 tablet (20 mg total) by mouth 2 (two) times daily. 09/21/17  Yes Emokpae, Courage, MD  fluticasone (FLONASE) 50 MCG/ACT nasal spray Place 2 sprays into both nostrils daily. 04/01/17  Yes Carlyle Dolly, MD  levothyroxine (SYNTHROID, LEVOTHROID) 150 MCG tablet Take 150 mcg by mouth daily before breakfast.  01/06/18  Yes [provider]  loratadine (CLARITIN) 10 MG tablet Take 1 tablet (10 mg total) by mouth daily. 10/30/17  Yes Angiulli, Lavon Paganini, PA-C  ondansetron (ZOFRAN) 4 MG tablet Take 1 tablet by mouth every 6 (six) hours as needed for nausea or vomiting.  07/25/14  Yes [provider]  oxyCODONE (ROXICODONE) 5 MG immediate release tablet Take 1 tablet (5 mg total) by mouth every 4 (four) hours as needed. 01/25/18 01/25/19 Yes Virl Cagey, MD  oxyCODONE-acetaminophen (PERCOCET/ROXICET) 5-325 MG tablet Take 0.5-1 tablets by mouth every 6 (six) hours as needed for severe pain.   Yes [provider]  pantoprazole (PROTONIX) 40 MG tablet  Take 1 tablet by mouth daily. 01/17/15  Yes [provider]  polyethylene glycol (MIRALAX / GLYCOLAX) packet Take 17 g by mouth daily. 10/30/17  Yes Angiulli, Lavon Paganini, PA-C  rivaroxaban (XARELTO) 20 MG TABS tablet Take 20 mg by mouth daily with supper.   Yes [provider]  senna (SENOKOT) 8.6 MG TABS tablet Take 1 tablet (8.6 mg total) by mouth 2 (two) times daily. 10/29/17  Yes Angiulli, Lavon Paganini, PA-C  sertraline (ZOLOFT) 50 MG tablet  12/24/17  Yes [provider]  sucralfate (CARAFATE) 1 g tablet Take 1 tablet (1 g total) by mouth 4 (four) times daily -  with meals and at bedtime. Please dissolve tablet in 10-15 mL of water to create a slurry. Patient should drink 10-15 minutes before each meals and at bedtime to reduce painful swallowing. 11/13/17  Yes Tyler Pita, MD  tiZANidine (ZANAFLEX) 2 MG tablet Take 2 mg by mouth every 8 (eight) hours as needed for muscle spasms.  01/06/18  Yes [provider]  sodium phosphate (FLEET) 7-19 GM/118ML ENEM Place 133 mLs (1 enema total) rectally daily as needed  for severe constipation. Patient not taking: Reported on 01/05/2018 10/29/17   Angiulli, Lavon Paganini, PA-C  sucralfate (CARAFATE) 1 GM/10ML suspension Take 10 mLs (1 g total) by mouth 4 (four) times daily -  with meals and at bedtime. Patient not taking: Reported on 02/03/2018 10/29/17   Cathlyn Parsons, PA-C    Current Facility-Administered Medications  Medication Dose Route Frequency Provider Last Rate Last Dose  . 0.9 %  sodium chloride infusion (Manually program via Guardrails IV Fluids)   Intravenous Once Johnson, Clanford L, MD      . 0.9 %  sodium chloride infusion (Manually program via Guardrails IV Fluids)   Intravenous Once [place], Gagan S, MD      . 0.9 %  sodium chloride infusion   Intravenous Continuous Wynetta Emery, Clanford L, MD 150 mL/hr at 02/04/18 1856    . acetaminophen (TYLENOL) tablet 650 mg  650 mg Oral Q4H PRN Oswald Hillock, MD   650 mg at 02/04/18  0818  . azithromycin (ZITHROMAX) 500 mg in sodium chloride 0.9 % 250 mL IVPB  500 mg Intravenous Q24H Oswald Hillock, MD 250 mL/hr at 02/04/18 1238 500 mg at 02/04/18 1238  . baclofen (LIORESAL) tablet 25 mg  25 mg Oral QID PRN Oswald Hillock, MD   25 mg at 02/05/18 0425  . cefTRIAXone (ROCEPHIN) 1 g in sodium chloride 0.9 % 100 mL IVPB  1 g Intravenous Q24H Johnson, Clanford L, MD 200 mL/hr at 02/04/18 2145 1 g at 02/04/18 2145  . Chlorhexidine Gluconate Cloth 2 % PADS 6 each  6 each Topical Daily Murlean Iba, MD   6 each at 02/05/18 0912  . famotidine (PEPCID) tablet 20 mg  20 mg Oral BID Oswald Hillock, MD   20 mg at 02/05/18 0909  . levothyroxine (SYNTHROID, LEVOTHROID) tablet 150 mcg  150 mcg Oral Q0600 Oswald Hillock, MD   150 mcg at 02/05/18 0500  . loratadine (CLARITIN) tablet 10 mg  10 mg Oral Daily Oswald Hillock, MD   10 mg at 02/05/18 0909  . MEDLINE mouth rinse  15 mL Mouth Rinse BID Johnson, Clanford L, MD   15 mL at 02/04/18 2140  . mupirocin ointment (BACTROBAN) 2 % 1 application  1 application Nasal BID Murlean Iba, MD   1 application at 25/95/63 0909  . ondansetron (ZOFRAN) tablet 4 mg  4 mg Oral Q6H PRN Oswald Hillock, MD       Or  . ondansetron [facility]) injection 4 mg  4 mg Intravenous Q6H PRN Oswald Hillock, MD   4 mg at 02/04/18 2140  . oxyCODONE (Oxy IR/ROXICODONE) immediate release tablet 5 mg  5 mg Oral Q6H PRN Oswald Hillock, MD      . pantoprazole (PROTONIX) EC tablet 40 mg  40 mg Oral Daily Oswald Hillock, MD   40 mg at 02/05/18 0909  . polyethylene glycol (MIRALAX / GLYCOLAX) packet 17 g  17 g Oral Daily Oswald Hillock, MD   17 g at 02/05/18 0909  . rivaroxaban (XARELTO) tablet 20 mg  20 mg Oral Q supper Oswald Hillock, MD   20 mg at 02/04/18 1719  . senna (SENOKOT) tablet 8.6 mg  1 tablet Oral BID Oswald Hillock, MD   8.6 mg at 02/05/18 0909  . sertraline (ZOLOFT) tablet 50 mg  50 mg Oral Daily Oswald Hillock, MD   50 mg at 02/05/18 0909  . sodium chloride flush  (  NS) 0.9 % injection 10-40 mL  10-40 mL Intracatheter PRN Johnson, Clanford L, MD      . sucralfate (CARAFATE) tablet 1 g  1 g Oral TID WC & HS Oswald Hillock, MD   1 g at 02/05/18 0908  . tiZANidine (ZANAFLEX) tablet 2 mg  2 mg Oral Q8H PRN Oswald Hillock, MD        Allergies as of 02/03/2018 - Review Complete 02/03/2018  Allergen Reaction Noted  . Bee venom Anaphylaxis 08/28/2015  . Keflex [cephalexin] Nausea And Vomiting 03/31/2014  . Tramadol Nausea And Vomiting 12/04/2013  . Gabapentin Nausea And Vomiting 08/05/2016  . Heparin Other (See Comments) 07/11/2015  . Hydrochlorothiazide Nausea And Vomiting 08/12/2016  . Hydrocodone Nausea And Vomiting 07/05/2015  . Oxycodone-acetaminophen Nausea And Vomiting 08/28/2015  . Sorbitol Nausea Only 10/15/2017  . Tizanidine  10/28/2017    Family History  Problem Relation Age of Onset  . Hypertension Mother   . Diabetes Mother   . Hypertension Father   . Stroke Father   . Diabetes Father   . Diabetes Sister   . Diabetes Sister     Social History   Socioeconomic History  . Marital status: Single    Spouse name: Not on file  . Number of children: Not on file  . Years of education: Not on file  . Highest education level: Not on file  Occupational History  . Not on file  Social Needs  . Financial resource strain: Not on file  . Food insecurity:    Worry: Not on file    Inability: Not on file  . Transportation needs:    Medical: Not on file    Non-medical: Not on file  Tobacco Use  . Smoking status: Never Smoker  . Smokeless tobacco: Never Used  Substance and Sexual Activity  . Alcohol use: No  . Drug use: No  . Sexual activity: Not Currently  Lifestyle  . Physical activity:    Days per week: Not on file    Minutes per session: Not on file  . Stress: Not on file  Relationships  . Social connections:    Talks on phone: Not on file    Gets together: Not on file    Attends religious service: Not on file    Active member  of club or organization: Not on file    Attends meetings of clubs or organizations: Not on file    Relationship status: Not on file  . Intimate partner violence:    Fear of current or ex partner: No    Emotionally abused: No    Physically abused: No    Forced sexual activity: No  Other Topics Concern  . Not on file  Social History Narrative  . Not on file    Review of Systems: General: Negative for anorexia, weight loss, fever, chills, fatigue, weakness. ENT: Negative for hoarseness, difficulty swallowing. CV: Negative for chest pain, angina, palpitations, peripheral edema.  Respiratory: Negative for dyspnea at rest, cough, sputum, wheezing.  GI: See history of present illness. Derm: Negative for rash or itching.  Endo: Negative for unusual weight change.  Heme: Negative for bruising or bleeding. Allergy: Negative for rash or hives.  Physical Exam: Vital signs in last 24 hours: Temp:  [97.2 F (36.2 C)-98.1 F (36.7 C)] 97.2 F (36.2 C) (11/15 0234) Pulse Rate:  [58-91] 66 (11/15 0700) Resp:  [11-25] 16 (11/15 0700) BP: (82-98)/(44-67) 86/59 (11/15 0700) SpO2:  [94 %-100 %] 100 % (  11/15 0700) Last BM Date: 02/04/18 General:   Alert,  Well-developed, well-nourished, pleasant and cooperative in NAD Head:  Normocephalic and atraumatic. Eyes:  Sclera clear, no icterus. Conjunctiva pink. Ears:  Normal auditory acuity. Neck:  Supple; no masses or thyromegaly. Lungs:  Clear throughout to auscultation. No wheezes, crackles, or rhonchi. No acute distress. Heart:  Regular rate and rhythm; no murmurs, clicks, rubs,  or gallops. Abdomen:  Soft, and nondistended. Mild RUQ/upper abdominal TTP. No masses, hepatosplenomegaly or hernias noted. Normal bowel sounds, without guarding, and without rebound.   Rectal:  Deferred.   Pulses:  Normal bilateral DP pulses noted. Extremities:  Without clubbing or edema. Neurologic:  Alert and  oriented x4;  grossly normal neurologically. Skin:   Intact without significant lesions or rashes. Psych:  Alert and cooperative. Normal mood and affect.  Intake/Output from previous day: 11/14 0701 - 11/15 0700 In: 3301.2 [I.V.:2002.9; IV Piggyback:1298.3] Out: -  Intake/Output this shift: No intake/output data recorded.  Lab Results: Recent Labs    02/03/18 2127 02/04/18 0432 02/05/18 0456  WBC 5.2 7.4 5.8  HGB 9.0* 7.8* 6.6*  HCT 29.6* 25.5* 21.3*  PLT 192 170 164   BMET Recent Labs    02/03/18 2127 02/04/18 0432 02/05/18 0456  NA 134* 135 135  K 3.6 4.5 3.4*  CL 103 103 106  CO2 24 26 24   GLUCOSE 110* 102* 86  BUN 19 19 17   CREATININE 0.93 0.90 0.78  CALCIUM 7.5* 7.4* 6.8*   LFT Recent Labs    02/04/18 0432  PROT 4.9*  ALBUMIN 1.9*  AST 12*  ALT 8  ALKPHOS 114  BILITOT 0.6   PT/INR No results for input(s): LABPROT, INR in the last 72 hours. Hepatitis Panel No results for input(s): HEPBSAG, HCVAB, HEPAIGM, HEPBIGM in the last 72 hours. C-Diff No results for input(s): CDIFFTOX in the last 72 hours.  Studies/Results: Dg Chest 2 View  Result Date: 02/03/2018 CLINICAL DATA:  Cough. EXAM: CHEST - 2 VIEW COMPARISON:  December 13, 2017 FINDINGS: There is a masslike opacity in the right upper lung, not seen in September 2019. There is infiltrate in the left retrocardiac region as well. No other interval changes or acute abnormalities. IMPRESSION: 1. The masslike opacity in the right upper lung is most likely infectious given the rapid development since December 13, 2017. There is also infiltrate in the left base, likely pneumonia. Recommend short-term follow-up after treatment to ensure resolution. Electronically Signed   By: Dorise Bullion III M.D   On: 02/03/2018 22:03   Dg Chest Port 1 View  Result Date: 02/05/2018 CLINICAL DATA:  Pneumonia.  History of thyroid carcinoma EXAM: PORTABLE CHEST 1 VIEW COMPARISON:  February 03, 2018 and December 13, 2017 FINDINGS: There is increase in consolidation in the  right upper lobe. There is new airspace consolidation in the left upper lobe. There is also patchy airspace consolidation in the right base, new. There is stable consolidation in the medial left base. Heart is upper normal in size with pulmonary vascularity normal. No appreciable adenopathy. Postoperative changes noted in the upper thoracic region. Trachea is midline. IMPRESSION: Multifocal pneumonia with progression compared to 2 days prior. Stable cardiac silhouette. Electronically Signed   By: Lowella Grip III M.D.   On: 02/05/2018 07:02    Impression: Pleasant 52 year old female who is in the unfortunate situation of metastatic thyroid cancer status post surgical resection of a spinal tumor resulting in paralysis at the T4 level.  We were consulted for  anemia and possible GI bleed.  When discussing with the patient she has not had any symptoms of GI bleeding.  Spoke with nursing staff and the patient has not had a bowel movement since being here.  Her hemoglobin is stable.  She is on Eliquis chronically.  She has had multiple surgical procedures in the past 1 to 2 years.  She does had a laparoscopic cholecystectomy about 11 days ago.  Overall abdominal pain is likely due to mild constipation and recent surgical procedure.  Anemia is likely multifactorial but I do not see any overt indication of GI bleed as a culprit.  She likely has anemia of chronic disease, some blood loss from recent surgical procedures, possible mild GI bleed in the setting of Eliquis.    I do not see an indication for urgent/inpatient endoscopic evaluation at this time.  Regardless, she is somewhat short of breath with diagnosis of pneumonia.  Her breathing is somewhat improved but still with some shortness of breath.  Endoscopic evaluation would be best held until more stable from a respiratory standpoint.  Pneumonia seems to be her driving condition at this time.  Plan: 1. Monitor for overt GI bleed 2. Follow  hgb 3. Transfuse as necessary 4. Can consider endoscopic evaluation pending changes and clinical progression 5. Supportive measures   Thank you for allowing [place] to participate in the care of Claudie Fisherman, DNP, AGNP-C Adult & Gerontological Nurse Practitioner [facility]    LOS: 1 day     02/05/2018, 9:36 AM

## 2018-02-05 NOTE — Care Management Note (Signed)
Case Management Note  Patient Details  Name: Sarah [name] MRN: [id number] Date of Birth: October 22, 1965  Subjective/Objective:    Chart reviewed.  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  From home with family. Active with home health nursing with [place].                 Action/Plan: CM will follow for needs.   Expected Discharge Date:    unk              Expected Discharge Plan:  [place]  In-House Referral:     Discharge planning Services  CM Consult  Post Acute Care Choice:  Home Health, Resumption of Svcs/PTA Provider Choice offered to:     DME Arranged:    DME Agency:     HH Arranged:    [place] Agency:     Status of Service:  Completed, signed off  If discussed at [employer] of Stay Meetings, dates discussed:    Additional Comments:  Penny Arrambide, Chauncey Reading, RN 02/05/2018, 12:17 PM

## 2018-02-05 NOTE — Progress Notes (Signed)
[facility]   HPI:  52 y.o. Female who is s/p lap cholecystectomy. She missed her clinic appointment due to being in hospital with PNA from SNF.  Doing fair. Denies any nausea/vomiting or issues with abdominal pain.  Now just having problems with PNA that she got after being discharged from her SNF.   Review of Systems:  Cough  PNA No abdominal pain Tolerating diet  All other review of systems: otherwise negative   Pathology: Diagnosis Gallbladder - CHRONIC CHOLECYSTITIS WITH CHOLELITHIASIS.  Vital Signs:  BP 93/62   Pulse 82   Temp (!) 96.8 F (36 C) (Oral)   Resp (!) 23   Ht 5\' 11"  (1.803 m)   Wt (!) 180.5 kg   LMP 10/22/2016 (LMP Unknown)   SpO2 99%   BMI 55.50 kg/m    Physical Exam:  Physical Exam  Abdominal:  Port site c/d/i with erythema or drainage, some bruising   Assessment:  52 y.o. yo Female with chronic cholecystitis s/p Lap chole who was admitted with PNA after being discharged from her SNF.  Doing fair.   Plan:  - Healing from gallbladder, diet as tolerated   - Activity as tolerated  - PRN Follow up   All of the above recommendations were discussed with the patient and patient's family, and all of patient's and family's questions were answered to their expressed satisfaction.  Curlene Labrum, MD [facility] [address] [place], [place] [phone] (office)

## 2018-02-05 NOTE — Progress Notes (Signed)
Lab called and informed this RN the patient's AM Hgb was 6.6. Intervention: Provider made aware via [place]. -currently awaiting orders -patient is asymptomatic at this time.

## 2018-02-06 DIAGNOSIS — K5900 Constipation, unspecified: Secondary | ICD-10-CM

## 2018-02-06 DIAGNOSIS — K5641 Fecal impaction: Secondary | ICD-10-CM | POA: Diagnosis present

## 2018-02-06 DIAGNOSIS — J189 Pneumonia, unspecified organism: Secondary | ICD-10-CM | POA: Diagnosis present

## 2018-02-06 LAB — CBC WITH DIFFERENTIAL/PLATELET
Abs Immature Granulocytes: 0.22 10*3/uL — ABNORMAL HIGH (ref 0.00–0.07)
BASOS ABS: 0 10*3/uL (ref 0.0–0.1)
Basophils Relative: 0 %
EOS ABS: 0.1 10*3/uL (ref 0.0–0.5)
EOS PCT: 1 %
HCT: 29 % — ABNORMAL LOW (ref 36.0–46.0)
Hemoglobin: 9 g/dL — ABNORMAL LOW (ref 12.0–15.0)
Immature Granulocytes: 3 %
LYMPHS ABS: 0.6 10*3/uL — AB (ref 0.7–4.0)
Lymphocytes Relative: 8 %
MCH: 28 pg (ref 26.0–34.0)
MCHC: 31 g/dL (ref 30.0–36.0)
MCV: 90.1 fL (ref 80.0–100.0)
MONO ABS: 0.3 10*3/uL (ref 0.1–1.0)
Monocytes Relative: 4 %
NRBC: 0 % (ref 0.0–0.2)
Neutro Abs: 5.8 10*3/uL (ref 1.7–7.7)
Neutrophils Relative %: 84 %
Platelets: 146 10*3/uL — ABNORMAL LOW (ref 150–400)
RBC: 3.22 MIL/uL — ABNORMAL LOW (ref 3.87–5.11)
RDW: 17 % — AB (ref 11.5–15.5)
WBC: 7 10*3/uL (ref 4.0–10.5)

## 2018-02-06 LAB — COMPREHENSIVE METABOLIC PANEL
ALBUMIN: 1.8 g/dL — AB (ref 3.5–5.0)
ALT: 9 U/L (ref 0–44)
ANION GAP: 5 (ref 5–15)
AST: 15 U/L (ref 15–41)
Alkaline Phosphatase: 108 U/L (ref 38–126)
BUN: 12 mg/dL (ref 6–20)
CO2: 23 mmol/L (ref 22–32)
Calcium: 6.9 mg/dL — ABNORMAL LOW (ref 8.9–10.3)
Chloride: 110 mmol/L (ref 98–111)
Creatinine, Ser: 0.86 mg/dL (ref 0.44–1.00)
Glucose, Bld: 89 mg/dL (ref 70–99)
POTASSIUM: 3.5 mmol/L (ref 3.5–5.1)
Sodium: 138 mmol/L (ref 135–145)
Total Bilirubin: 0.6 mg/dL (ref 0.3–1.2)
Total Protein: 4.6 g/dL — ABNORMAL LOW (ref 6.5–8.1)

## 2018-02-06 LAB — STREP PNEUMONIAE URINARY ANTIGEN: Strep Pneumo Urinary Antigen: NEGATIVE

## 2018-02-06 LAB — OCCULT BLOOD X 1 CARD TO LAB, STOOL: FECAL OCCULT BLD: NEGATIVE

## 2018-02-06 MED ORDER — FLEET ENEMA 7-19 GM/118ML RE ENEM
1.0000 | ENEMA | Freq: Every day | RECTAL | Status: DC
  Administered 2018-02-06 – 2018-02-14 (×9): 1 via RECTAL

## 2018-02-06 MED ORDER — TIZANIDINE HCL 2 MG PO TABS
2.0000 mg | ORAL_TABLET | Freq: Three times a day (TID) | ORAL | Status: DC | PRN
  Administered 2018-02-06: 2 mg via ORAL
  Filled 2018-02-06: qty 1

## 2018-02-06 MED ORDER — POTASSIUM CHLORIDE CRYS ER 20 MEQ PO TBCR
40.0000 meq | EXTENDED_RELEASE_TABLET | Freq: Once | ORAL | Status: AC
  Administered 2018-02-06: 40 meq via ORAL
  Filled 2018-02-06: qty 2

## 2018-02-06 MED ORDER — IPRATROPIUM-ALBUTEROL 0.5-2.5 (3) MG/3ML IN SOLN
3.0000 mL | Freq: Three times a day (TID) | RESPIRATORY_TRACT | Status: DC
  Administered 2018-02-06 – 2018-02-23 (×52): 3 mL via RESPIRATORY_TRACT
  Filled 2018-02-06 (×53): qty 3

## 2018-02-06 MED ORDER — VANCOMYCIN HCL 10 G IV SOLR
2000.0000 mg | Freq: Once | INTRAVENOUS | Status: AC
  Administered 2018-02-06: 2000 mg via INTRAVENOUS
  Filled 2018-02-06: qty 2000

## 2018-02-06 MED ORDER — GUAIFENESIN-DM 100-10 MG/5ML PO SYRP
10.0000 mL | ORAL_SOLUTION | ORAL | Status: DC | PRN
  Administered 2018-02-06 – 2018-02-12 (×2): 10 mL via ORAL
  Filled 2018-02-06 (×2): qty 10

## 2018-02-06 MED ORDER — PROMETHAZINE HCL 25 MG/ML IJ SOLN
12.5000 mg | Freq: Four times a day (QID) | INTRAMUSCULAR | Status: DC | PRN
  Administered 2018-02-06 – 2018-02-23 (×9): 12.5 mg via INTRAVENOUS
  Filled 2018-02-06 (×11): qty 1

## 2018-02-06 MED ORDER — VANCOMYCIN HCL 10 G IV SOLR
1500.0000 mg | Freq: Two times a day (BID) | INTRAVENOUS | Status: DC
  Administered 2018-02-06 – 2018-02-08 (×5): 1500 mg via INTRAVENOUS
  Filled 2018-02-06 (×8): qty 1500

## 2018-02-06 MED ORDER — DOCUSATE SODIUM 283 MG RE ENEM
1.0000 | ENEMA | Freq: Every day | RECTAL | Status: DC
  Filled 2018-02-06 (×3): qty 1

## 2018-02-06 MED ORDER — SODIUM CHLORIDE 0.9 % IV SOLN
2.0000 g | Freq: Three times a day (TID) | INTRAVENOUS | Status: DC
  Administered 2018-02-06 – 2018-02-09 (×9): 2 g via INTRAVENOUS
  Filled 2018-02-06 (×13): qty 2

## 2018-02-06 NOTE — Progress Notes (Signed)
PROGRESS NOTE    Sarah [name]  FXT:[id number]  DOB: 1965-07-15  DOA: 02/03/2018 PCP: [name], MD   Brief Admission Hx: 52 y.o. 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  Patient states that symptoms have been going on for past 3 days.  She also complains of nausea but no vomiting.  Denies any fever.  Denies chest pain.  Pt was admitted with a multifocal pneumonia.   MDM/Assessment & Plan:    1. Healthcare associated pneumonia-chest x-ray shows masslike opacity, likely infectious as it developed quickly since December 13, 2017.  CT confirms a multifocal pneumonia.  Pt was initially started on ceftriaxone and Zithromax but given the severity of infection and recent postop status, I am broadening coverage to cefepime/vancomycin.  MRSA (+).  Patient will need repeat chest x-ray in 2 to 3 weeks to check further resolution of pneumonia.  Continue supportive care.  Pt says that she is feeling a little better.   Blood cultures NGTD.  Check strep pneumo and legionella.   2. History of pulmonary embolism-continue Xarelto for full anticoagulation.   3. 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 Pt has been treated with radiation and chemotherapy.  Follow up with oncology when discharged.    4. Hypothyroidism-continue Synthroid 5. Hypotension - soft blood pressures likely multifactorial from dehydration, infection, anemia, paraplegia.  Pt is asymptomatic and MAP>65.  Will follow closely in SDU.  PICC line in place if there is a need for pressor support.    6. Anemia - unspecified.  Hg down with hydration.  Transfused 2 units PRBC 11/15.  Appreciate GI consult and assistance.  CT abd/pelvis negative for retroperitoneal bleed or hematoma.  Hg up  to 9 after transfusion.  Following.    7. Hypokalemia - check Mg and replace as needed.  Follow.  8. Poor IV access - PICC line in place.  9. Stage 2 sacral pressure injury and stage 1 left heel injury - wound care protocol.  10. Fecal impaction - seen on CT abdomen/pelvis - Ordered disimpaction and fleet enema.    DVT Prophylaxis-   Xarelto  AM Labs Ordered, also please review Full Orders  Family Communication: Admission, patients condition and plan of care including tests being ordered have been discussed with the patient who indicate understanding and agree with the plan and Code Status.  Code Status:  Full code  Consultants:  GI  Antimicrobials:  Ceftriaxone 11/14  Azithromycin 11/14  Pressure Injury Documentation: Pressure Injury 02/04/18 Stage II -  Partial thickness loss of dermis presenting as a shallow open ulcer with a red, pink wound bed without slough. (Active)  02/04/18 0130   Location: Sacrum  Location Orientation: Mid  Staging: Stage II -  Partial thickness loss of dermis presenting as a shallow open ulcer with a red, pink wound bed without slough.  Wound Description (Comments):   Present on Admission: Yes     Pressure Injury 02/04/18 Stage I -  Intact skin with non-blanchable redness of a localized area usually over a bony prominence. (Active)  02/04/18 0120   Location: Heel  Location Orientation: Left  Staging: Stage I -  Intact skin with non-blanchable redness of a localized area usually over a bony prominence.  Wound Description (Comments):   Present on Admission: Yes  Subjective: Pt says that she is short of breath at times.  She has no chest pain. She has no chills.      Objective: Vitals:   02/06/18 0529 02/06/18 0600 02/06/18 0700 02/06/18 0737  BP:  (!) 90/53 (!) 87/53   Pulse:  93 85   Resp:  (!) 21 (!) 28   Temp:    (!) 96.9 F (36.1 C)  TempSrc:    Oral  SpO2: 100% 98% 98%   Weight:      Height:        Intake/Output  Summary (Last 24 hours) at 02/06/2018 0852 Last data filed at 02/06/2018 0514 Gross per 24 hour  Intake 3324.53 ml  Output 2000 ml  Net 1324.53 ml   Filed Weights   02/03/18 2035 02/04/18 0421  Weight: 99.8 kg (!) 180.5 kg     REVIEW OF SYSTEMS  As per history otherwise all reviewed and reported negative  Exam:  General exam: awake, alert, NAD cooperative.  Respiratory system: rales RUL. Diminished BS LLL. No increased work of breathing. Cardiovascular system: S1 & S2 heard, RRR. No JVD, murmurs, gallops, clicks or pedal edema. Gastrointestinal system: Abdomen is nondistended, soft and nontender. Normal bowel sounds heard. Central nervous system: Alert and oriented. Paraplegic.  Extremities: no cyanosis or clubbing.  Data Reviewed: Basic Metabolic Panel: Recent Labs  Lab 02/03/18 2127 02/04/18 0432 02/05/18 0456 02/05/18 0729 02/06/18 0719  NA 134* 135 135  --  138  K 3.6 4.5 3.4*  --  3.5  CL 103 103 106  --  110  CO2 24 26 24   --  23  GLUCOSE 110* 102* 86  --  89  BUN 19 19 17   --  12  CREATININE 0.93 0.90 0.78  --  0.86  CALCIUM 7.5* 7.4* 6.8*  --  6.9*  MG  --   --   --  1.7  --    Liver Function Tests: Recent Labs  Lab 02/04/18 0432 02/06/18 0719  AST 12* 15  ALT 8 9  ALKPHOS 114 108  BILITOT 0.6 0.6  PROT 4.9* 4.6*  ALBUMIN 1.9* 1.8*   No results for input(s): LIPASE, AMYLASE in the last 168 hours. No results for input(s): AMMONIA in the last 168 hours. CBC: Recent Labs  Lab 02/03/18 2127 02/04/18 0432 02/05/18 0456 02/06/18 0719  WBC 5.2 7.4 5.8 7.0  NEUTROABS 4.4  --   --  5.8  HGB 9.0* 7.8* 6.6* 9.0*  HCT 29.6* 25.5* 21.3* 29.0*  MCV 94.0 94.4 94.7 90.1  PLT 192 170 164 146*   Cardiac Enzymes: No results for input(s): CKTOTAL, CKMB, CKMBINDEX, TROPONINI in the last 168 hours. CBG (last 3)  No results for input(s): GLUCAP in the last 72 hours. Recent Results (from the past 240 hour(s))  Blood culture (routine x 2)     Status: None  (Preliminary result)   Collection Time: 02/03/18 10:34 PM  Result Value Ref Range Status   Specimen Description BLOOD LEFT HAND  Final   Special Requests   Final    BOTTLES DRAWN AEROBIC AND ANAEROBIC Blood Culture adequate volume   Culture   Final    NO GROWTH 3 DAYS Performed at [facility], [address] [place], [place] [address]    Report Status PENDING  Incomplete  Blood culture (routine x 2)     Status: None (Preliminary result)   Collection Time: 02/03/18 10:46 PM  Result Value Ref Range Status   Specimen  Description RIGHT ANTECUBITAL  Final   Special Requests   Final    BOTTLES DRAWN AEROBIC AND ANAEROBIC Blood Culture adequate volume   Culture   Final    NO GROWTH 3 DAYS Performed at [facility], [address] [place], [place] [address]    Report Status PENDING  Incomplete  MRSA PCR Screening     Status: Abnormal   Collection Time: 02/04/18  1:30 AM  Result Value Ref Range Status   MRSA by PCR POSITIVE (A) NEGATIVE Final    Comment:        The GeneXpert MRSA Assay (FDA approved for NASAL specimens only), is one component of a comprehensive MRSA colonization surveillance program. It is not intended to diagnose MRSA infection nor to guide or monitor treatment for MRSA infections. RESULT CALLED TO, READ BACK BY AND VERIFIED WITH: Carlye Grippe R. AT 0935A ON [id number] BY THOMPSON S. Performed at [facility], [address], [place], [place] [address]      Studies: Ct Abdomen Pelvis Wo Contrast  Result Date: 02/05/2018 CLINICAL DATA:  52 year old female with abdominal pain. Anemia and decreased blood pressures. Query retroperitoneal bleed. Community-acquired pneumonia. Recent laparoscopic cholecystectomy. Metastatic thyroid carcinoma, and paraplegia at the T4 level. EXAM: CT ABDOMEN AND PELVIS WITHOUT CONTRAST TECHNIQUE: Multidetector CT imaging of the abdomen and pelvis was performed following the standard protocol without IV contrast. COMPARISON:  Preoperative CT  Abdomen and Pelvis [facility] 12/13/2017. FINDINGS: Lower chest: Small bilateral layering pleural effusions, stable on the left since September but increased on the right. Confluent posterior basal segment left lower lobe consolidation with air bronchograms is new since September. Widespread additional bilateral lower lobe patchy and confluent peribronchial opacity. There is also right middle lobe involvement (series 4, image 3). No pericardial effusion. Hepatobiliary: Appearance of recent cholecystectomy with small volume postoperative gas and fluid in the gallbladder bed. No other perihepatic fluid and otherwise negative noncontrast appearance of the liver. Pancreas: Negative. Spleen: Negative. Adrenals/Urinary Tract: Normal adrenal glands. Stable and negative noncontrast kidneys. No retroperitoneal hemorrhage in the abdomen or pelvis. Decompressed ureters. Unremarkable urinary bladder. Stomach/Bowel: Abundant rectosigmoid retained stool, increased since September. Chronic circumferential rectal wall thickening. Highly redundant sigmoid colon. The proximal sigmoid and descending are fairly decompressed. Mild retained stool in the transverse colon similar to the prior study. Negative right colon and appendix. The cecum is on a lax mesentery located anteriorly near the midline. Negative terminal ileum. No dilated small bowel. Decompressed stomach and duodenum. No free air. Vascular/Lymphatic: Vascular patency is not evaluated in the absence of IV contrast. No lymphadenopathy. Reproductive: Negative noncontrast appearance aside from the adjacent free fluid. Other: Mild presacral stranding is stable since September. New small volume simple appearing free fluid in the anterior pelvis anterior to the rectum (series 2, image 77). Musculoskeletal: Stable visualized osseous structures. Advanced degenerative changes in the lumbar spine. Previous proximal left femur ORIF. There is bilateral body wall edema which  is increased compared to September. IMPRESSION: 1. Negative for retroperitoneal hemorrhage. No hematoma identified in the abdomen or pelvis. 2. Bilateral multilobar pneumonia, with dense left lower lobe consolidation. Increased right and stable left small pleural effusions. 3. Satisfactory appearance of recent post cholecystectomy changes. 4. Small volume of simple appearing fluid in the pelvis is nonspecific but may be related to #3. 5. Increased rectosigmoid stool since September raising the possibility of fecal impaction. Electronically Signed   By: Genevie Ann M.D.   On: 02/05/2018 17:44   Dg Chest Port 1  View  Result Date: 02/05/2018 CLINICAL DATA:  Pneumonia.  History of thyroid carcinoma EXAM: PORTABLE CHEST 1 VIEW COMPARISON:  February 03, 2018 and December 13, 2017 FINDINGS: There is increase in consolidation in the right upper lobe. There is new airspace consolidation in the left upper lobe. There is also patchy airspace consolidation in the right base, new. There is stable consolidation in the medial left base. Heart is upper normal in size with pulmonary vascularity normal. No appreciable adenopathy. Postoperative changes noted in the upper thoracic region. Trachea is midline. IMPRESSION: Multifocal pneumonia with progression compared to 2 days prior. Stable cardiac silhouette. Electronically Signed   By: Lowella Grip III M.D.   On: 02/05/2018 07:02   Scheduled Meds: . sodium chloride   Intravenous Once  . sodium chloride   Intravenous Once  . Chlorhexidine Gluconate Cloth  6 each Topical Daily  . famotidine  20 mg Oral BID  . ipratropium-albuterol  3 mL Nebulization TID  . levothyroxine  150 mcg Oral Q0600  . loratadine  10 mg Oral Daily  . mouth rinse  15 mL Mouth Rinse BID  . mupirocin ointment  1 application Nasal BID  . pantoprazole  40 mg Oral Daily  . polyethylene glycol  17 g Oral Daily  . rivaroxaban  20 mg Oral Q supper  . senna  1 tablet Oral BID  . sertraline  50 mg  Oral Daily  . sodium phosphate  1 enema Rectal Daily   Continuous Infusions: . sodium chloride 125 mL/hr at 02/06/18 0015  . ceFEPime (MAXIPIME) IV    . vancomycin    . vancomycin      Active Problems:   CAP (community acquired pneumonia)   Pressure injury of skin   Anemia   Chronic anticoagulation  Critical care Time spent: 34 mins  Irwin Brakeman, MD, FAAFP [facility] Pager [phone]  If 7PM-7AM, please contact night-coverage [email or web address] Password [email or web address] 02/06/2018, 8:52 AM    LOS: 2 days

## 2018-02-06 NOTE — Progress Notes (Signed)
Pharmacy Antibiotic Note  Sarah [name] is a 52 y.o. female admitted on 02/03/2018 with pneumonia.  Pharmacy has been consulted for cefepime and vancomycin dosing.  Plan: Vancomycin 1500mg  IV every 12 hours.  Goal trough 15-20 mcg/mL. cefepime 2gm iv q8h  Height: 5\' 11"  (180.3 cm) Weight: (!) 397 lb 14.9 oz (180.5 kg) IBW/kg (Calculated) : 70.8  Temp (24hrs), Avg:97.7 F (36.5 C), Min:96.8 F (36 C), Max:98.9 F (37.2 C)  Recent Labs  Lab 02/03/18 2127 02/03/18 2136 02/04/18 0432 02/05/18 0456 02/06/18 0719  WBC 5.2  --  7.4 5.8 7.0  CREATININE 0.93  --  0.90 0.78 0.86  LATICACIDVEN  --  1.02  --   --   --     Estimated Creatinine Clearance: 140.1 mL/min (by C-G formula based on SCr of 0.86 mg/dL).    Allergies  Allergen Reactions  . Bee Venom Anaphylaxis  . Keflex [Cephalexin] Nausea And Vomiting  . Tramadol Nausea And Vomiting  . Gabapentin Nausea And Vomiting  . Heparin Other (See Comments)    Weak positive platelets induced antibodies, SRA negative--2015  . Hydrochlorothiazide Nausea And Vomiting  . Hydrocodone Nausea And Vomiting  . Oxycodone-Acetaminophen Nausea And Vomiting  . Sorbitol Nausea Only    And abdominal distress  . Tizanidine     Fatigue    Anti-infectives (From admission, onward)   Start     Dose/Rate Route Frequency Ordered Stop   02/06/18 1900  vancomycin (VANCOCIN) 1,500 mg in sodium chloride 0.9 % 500 mL IVPB     1,500 mg 250 mL/hr over 120 Minutes Intravenous Every 12 hours 02/06/18 0827     02/06/18 0830  vancomycin (VANCOCIN) 2,000 mg in sodium chloride 0.9 % 500 mL IVPB     2,000 mg 250 mL/hr over 120 Minutes Intravenous  Once 02/06/18 0826     02/06/18 0830  ceFEPIme (MAXIPIME) 2 g in sodium chloride 0.9 % 100 mL IVPB     2 g 200 mL/hr over 30 Minutes Intravenous Every 8 hours 02/06/18 0826     02/05/18 1500  cefTRIAXone (ROCEPHIN) 1 g in sodium chloride 0.9 % 100 mL IVPB  Status:  Discontinued     1 g 200 mL/hr over 30 Minutes  Intravenous Every 24 hours 02/04/18 0412 02/04/18 1421   02/04/18 2200  cefTRIAXone (ROCEPHIN) 1 g in sodium chloride 0.9 % 100 mL IVPB  Status:  Discontinued     1 g 200 mL/hr over 30 Minutes Intravenous Every 24 hours 02/04/18 1421 02/06/18 0642   02/04/18 1300  azithromycin (ZITHROMAX) 500 mg in sodium chloride 0.9 % 250 mL IVPB  Status:  Discontinued     500 mg 250 mL/hr over 60 Minutes Intravenous Every 24 hours 02/04/18 0412 02/06/18 0642   02/03/18 2215  cefTRIAXone (ROCEPHIN) 1 g in sodium chloride 0.9 % 100 mL IVPB     1 g 200 mL/hr over 30 Minutes Intravenous  Once 02/03/18 2214 02/03/18 2322   02/03/18 2215  azithromycin (ZITHROMAX) 500 mg in sodium chloride 0.9 % 250 mL IVPB     500 mg 250 mL/hr over 60 Minutes Intravenous  Once 02/03/18 2214 02/04/18 0024      Antimicrobials this admission: 11/13 azithromycin >> 11/14 11/13 rocephin >> 11/15 11/16 cefepime >> 11/16 vancomycin >>    Microbiology results: 11/13 BCx: ngtd 11/14 MRSA PCR: positive  Thank you for allowing pharmacy to be a part of this patient's care.  Sarah [name] Sarah [name] 02/06/2018 8:27 AM

## 2018-02-06 NOTE — Plan of Care (Signed)
  Problem: Respiratory: Goal: Ability to maintain a clear airway will improve Outcome: Not Progressing Note:  Educated patient on importance of using the incentive spirometer, flutter valve and cough/deep breathing. [name] up to 500 today, down from 750 yesterday. Patient continues to have a very weak cough and rhonchi on auscultation. Continuing to push pulmonary toilet.

## 2018-02-06 NOTE — Progress Notes (Signed)
Feels better after enemas.  Good results. Tolerating her diet.  Hemoccult negative stool recovered with enemas.  Vital signs in last 24 hours: Temp:  [96.9 F (36.1 C)-98.9 F (37.2 C)] 96.9 F (36.1 C) (11/16 0737) Pulse Rate:  [60-93] 77 (11/16 1100) Resp:  [14-28] 19 (11/16 1100) BP: (79-127)/(49-84) 127/84 (11/16 1100) SpO2:  [94 %-100 %] 95 % (11/16 1100) Last BM Date: 02/04/18 General:   Alert, pleasant in no acute distress.   Abdomen: Soft nontender  Extremities:  Without clubbing or edema.    Intake/Output from previous day: 11/15 0701 - 11/16 0700 In: 3324.5 [I.V.:2344.5; Blood:630; IV Piggyback:350] Out: 2000 [Urine:2000] Intake/Output this shift: No intake/output data recorded.  Lab Results: Recent Labs    02/04/18 0432 02/05/18 0456 02/06/18 0719  WBC 7.4 5.8 7.0  HGB 7.8* 6.6* 9.0*  HCT 25.5* 21.3* 29.0*  PLT 170 164 146*   BMET Recent Labs    02/04/18 0432 02/05/18 0456 02/06/18 0719  NA 135 135 138  K 4.5 3.4* 3.5  CL 103 106 110  CO2 26 24 23   GLUCOSE 102* 86 89  BUN 19 17 12   CREATININE 0.90 0.78 0.86  CALCIUM 7.4* 6.8* 6.9*   LFT Recent Labs    02/06/18 0719  PROT 4.6*  ALBUMIN 1.8*  AST 15  ALT 9  ALKPHOS 108  BILITOT 0.6   PT/INR No results for input(s): LABPROT, INR in the last 72 hours. Hepatitis Panel No results for input(s): HEPBSAG, HCVAB, HEPAIGM, HEPBIGM in the last 72 hours. C-Diff No results for input(s): CDIFFTOX in the last 72 hours.  Studies/Results: Ct Abdomen Pelvis Wo Contrast  Result Date: 02/05/2018 CLINICAL DATA:  52 year old female with abdominal pain. Anemia and decreased blood pressures. Query retroperitoneal bleed. Community-acquired pneumonia. Recent laparoscopic cholecystectomy. Metastatic thyroid carcinoma, and paraplegia at the T4 level. EXAM: CT ABDOMEN AND PELVIS WITHOUT CONTRAST TECHNIQUE: Multidetector CT imaging of the abdomen and pelvis was performed following the standard protocol without IV  contrast. COMPARISON:  Preoperative CT Abdomen and Pelvis [facility] 12/13/2017. FINDINGS: Lower chest: Small bilateral layering pleural effusions, stable on the left since September but increased on the right. Confluent posterior basal segment left lower lobe consolidation with air bronchograms is new since September. Widespread additional bilateral lower lobe patchy and confluent peribronchial opacity. There is also right middle lobe involvement (series 4, image 3). No pericardial effusion. Hepatobiliary: Appearance of recent cholecystectomy with small volume postoperative gas and fluid in the gallbladder bed. No other perihepatic fluid and otherwise negative noncontrast appearance of the liver. Pancreas: Negative. Spleen: Negative. Adrenals/Urinary Tract: Normal adrenal glands. Stable and negative noncontrast kidneys. No retroperitoneal hemorrhage in the abdomen or pelvis. Decompressed ureters. Unremarkable urinary bladder. Stomach/Bowel: Abundant rectosigmoid retained stool, increased since September. Chronic circumferential rectal wall thickening. Highly redundant sigmoid colon. The proximal sigmoid and descending are fairly decompressed. Mild retained stool in the transverse colon similar to the prior study. Negative right colon and appendix. The cecum is on a lax mesentery located anteriorly near the midline. Negative terminal ileum. No dilated small bowel. Decompressed stomach and duodenum. No free air. Vascular/Lymphatic: Vascular patency is not evaluated in the absence of IV contrast. No lymphadenopathy. Reproductive: Negative noncontrast appearance aside from the adjacent free fluid. Other: Mild presacral stranding is stable since September. New small volume simple appearing free fluid in the anterior pelvis anterior to the rectum (series 2, image 77). Musculoskeletal: Stable visualized osseous structures. Advanced degenerative changes in the lumbar spine. Previous proximal left  femur ORIF.  There is bilateral body wall edema which is increased compared to September. IMPRESSION: 1. Negative for retroperitoneal hemorrhage. No hematoma identified in the abdomen or pelvis. 2. Bilateral multilobar pneumonia, with dense left lower lobe consolidation. Increased right and stable left small pleural effusions. 3. Satisfactory appearance of recent post cholecystectomy changes. 4. Small volume of simple appearing fluid in the pelvis is nonspecific but may be related to #3. 5. Increased rectosigmoid stool since September raising the possibility of fecal impaction. Electronically Signed   By: Genevie Ann M.D.   On: 02/05/2018 17:44   Dg Chest Port 1 View  Result Date: 02/05/2018 CLINICAL DATA:  Pneumonia.  History of thyroid carcinoma EXAM: PORTABLE CHEST 1 VIEW COMPARISON:  February 03, 2018 and December 13, 2017 FINDINGS: There is increase in consolidation in the right upper lobe. There is new airspace consolidation in the left upper lobe. There is also patchy airspace consolidation in the right base, new. There is stable consolidation in the medial left base. Heart is upper normal in size with pulmonary vascularity normal. No appreciable adenopathy. Postoperative changes noted in the upper thoracic region. Trachea is midline. IMPRESSION: Multifocal pneumonia with progression compared to 2 days prior. Stable cardiac silhouette. Electronically Signed   By: Lowella Grip III M.D.   On: 02/05/2018 07:02    Impression.  Unfortunate 52 year old lady with multiple comorbidities including metastatic thyroid cancer, pulmonary embolism requiring full anticoagulation and acute on chronic anemia without overt GI bleeding.  Anemia multifactorial in origin.  Hemoccult negative today. Abdominal CT yesterday negative for retroperitoneal hematoma.  Increased stool in rectosigmoid suggesting impaction  - managed by hospitalist.  Recommendations: Agree with management of constipation.  Would leave her on a PPI  empirically indefinitely.  No further GI evaluation warranted at this time.  Should clinical picture change, please let us know.  Signing off for now.

## 2018-02-07 LAB — CBC WITH DIFFERENTIAL/PLATELET
ABS IMMATURE GRANULOCYTES: 0.35 10*3/uL — AB (ref 0.00–0.07)
BASOS PCT: 1 %
Basophils Absolute: 0 10*3/uL (ref 0.0–0.1)
EOS ABS: 0 10*3/uL (ref 0.0–0.5)
Eosinophils Relative: 0 %
HEMATOCRIT: 31.4 % — AB (ref 36.0–46.0)
Hemoglobin: 9.4 g/dL — ABNORMAL LOW (ref 12.0–15.0)
Immature Granulocytes: 5 %
LYMPHS ABS: 0.4 10*3/uL — AB (ref 0.7–4.0)
Lymphocytes Relative: 6 %
MCH: 27.6 pg (ref 26.0–34.0)
MCHC: 29.9 g/dL — ABNORMAL LOW (ref 30.0–36.0)
MCV: 92.1 fL (ref 80.0–100.0)
MONO ABS: 0.3 10*3/uL (ref 0.1–1.0)
MONOS PCT: 5 %
NEUTROS ABS: 5.7 10*3/uL (ref 1.7–7.7)
Neutrophils Relative %: 83 %
PLATELETS: 148 10*3/uL — AB (ref 150–400)
RBC: 3.41 MIL/uL — ABNORMAL LOW (ref 3.87–5.11)
RDW: 16.9 % — ABNORMAL HIGH (ref 11.5–15.5)
WBC: 6.9 10*3/uL (ref 4.0–10.5)
nRBC: 0 % (ref 0.0–0.2)

## 2018-02-07 LAB — TYPE AND SCREEN
ABO/RH(D): O POS
Antibody Screen: NEGATIVE
UNIT DIVISION: 0
UNIT DIVISION: 0

## 2018-02-07 LAB — BPAM RBC
BLOOD PRODUCT EXPIRATION DATE: 201912122359
Blood Product Expiration Date: 201912122359
ISSUE DATE / TIME: 201911151043
ISSUE DATE / TIME: 201911151339
UNIT TYPE AND RH: 5100
UNIT TYPE AND RH: 5100

## 2018-02-07 LAB — COMPREHENSIVE METABOLIC PANEL
ALT: 9 U/L (ref 0–44)
AST: 13 U/L — ABNORMAL LOW (ref 15–41)
Albumin: 1.8 g/dL — ABNORMAL LOW (ref 3.5–5.0)
Alkaline Phosphatase: 111 U/L (ref 38–126)
Anion gap: 4 — ABNORMAL LOW (ref 5–15)
BILIRUBIN TOTAL: 0.9 mg/dL (ref 0.3–1.2)
BUN: 11 mg/dL (ref 6–20)
CALCIUM: 7.3 mg/dL — AB (ref 8.9–10.3)
CHLORIDE: 113 mmol/L — AB (ref 98–111)
CO2: 23 mmol/L (ref 22–32)
CREATININE: 0.76 mg/dL (ref 0.44–1.00)
GFR calc non Af Amer: >60 mL/min (ref >60)
Glucose, Bld: 112 mg/dL — ABNORMAL HIGH (ref 70–99)
Potassium: 4 mmol/L (ref 3.5–5.1)
Sodium: 140 mmol/L (ref 135–145)
TOTAL PROTEIN: 4.8 g/dL — AB (ref 6.5–8.1)

## 2018-02-07 LAB — LEGIONELLA PNEUMOPHILA SEROGP 1 UR AG: L. PNEUMOPHILA SEROGP 1 UR AG: NEGATIVE

## 2018-02-07 LAB — MAGNESIUM: MAGNESIUM: 1.9 mg/dL (ref 1.7–2.4)

## 2018-02-07 MED ORDER — HYDROXYZINE HCL 25 MG PO TABS
25.0000 mg | ORAL_TABLET | Freq: Three times a day (TID) | ORAL | Status: DC | PRN
  Administered 2018-02-07 – 2018-02-12 (×10): 25 mg via ORAL
  Filled 2018-02-07 (×10): qty 1

## 2018-02-07 MED ORDER — LORAZEPAM 2 MG/ML IJ SOLN
1.0000 mg | Freq: Once | INTRAMUSCULAR | Status: AC
  Administered 2018-02-07: 1 mg via INTRAVENOUS
  Filled 2018-02-07: qty 1

## 2018-02-07 MED ORDER — FUROSEMIDE 10 MG/ML IJ SOLN
20.0000 mg | Freq: Two times a day (BID) | INTRAMUSCULAR | Status: DC
  Administered 2018-02-07 – 2018-02-08 (×3): 20 mg via INTRAVENOUS
  Filled 2018-02-07 (×3): qty 2

## 2018-02-07 NOTE — Progress Notes (Signed)
**Note De-Identified Susanne Baumgarner Obfuscation** Respiratory note:  Patient started on Metaneb this AM with HHN for aggressive pulmonary toiletary; patient tolerated well.  Will continue therapy as patient tolerates.

## 2018-02-07 NOTE — Progress Notes (Signed)
PROGRESS NOTE    Sarah [name]  GNF:[id number]  DOB: 03/19/1966  DOA: 02/03/2018 PCP: [name], MD   Brief Admission Hx: 52 y.o. 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  Patient states that symptoms have been going on for past 3 days.  She also complains of nausea but no vomiting.  Denies any fever.  Denies chest pain.  Pt was admitted with a multifocal pneumonia.   MDM/Assessment & Plan:    1. Healthcare associated pneumonia-chest x-ray shows masslike opacity, likely infectious as it developed quickly since December 13, 2017.  CT confirms a multifocal pneumonia.  Pt was initially started on ceftriaxone and Zithromax but given the severity of infection and recent postop status, coverage was broadened to cefepime/vancomycin.  MRSA (+).  Patient will need repeat chest x-ray in 2 to 3 weeks to check further resolution of pneumonia.  Continue supportive care.  Pt says that she is feeling a little better.   Blood cultures NGTD.  Check strep pneumo and legionella.   2. History of pulmonary embolism-continue Xarelto for full anticoagulation.   3. 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 Pt has been treated with radiation and chemotherapy.  Follow up with oncology when discharged.    4. Hypothyroidism-continue Synthroid 5. Hypotension - soft blood pressures likely multifactorial from dehydration, infection, anemia, paraplegia.  Pt is asymptomatic and MAP>65.  Will follow closely in SDU.  PICC line in place if there is a need for pressor support.    6. Anemia - unspecified.  Hg down with hydration.  Transfused 2 units PRBC 11/15 with improvement of hemoglobin.  Appreciate GI consult and assistance.  CT abd/pelvis negative for retroperitoneal  bleed or hematoma.  Hg up to 9 after transfusion.  Following.    7. Hypokalemia - check Mg and replace as needed.  Follow.  8. Poor IV access - PICC line in place.  9. Stage 2 sacral pressure injury and stage 1 left heel injury - wound care protocol.  10. Fecal impaction - seen on CT abdomen/pelvis -feels better after receiving enemas 11. Anasarca.  Related to hypoalbuminemia.  Started on low-dose Lasix.  DVT Prophylaxis-   Xarelto  AM Labs Ordered, also please review Full Orders  Family Communication: Admission, patients condition and plan of care including tests being ordered have been discussed with the patient who indicate understanding and agree with the plan and Code Status.  Code Status:  Full code  Consultants:  GI  Antimicrobials:  Ceftriaxone 11/14 >11/16  Azithromycin 11/14>11/16  Vancomycin 11/16 >  Cefepime 11/16 >  Pressure Injury Documentation: Pressure Injury 02/04/18 Stage II -  Partial thickness loss of dermis presenting as a shallow open ulcer with a red, pink wound bed without slough. (Active)  02/04/18 0130   Location: Sacrum  Location Orientation: Mid  Staging: Stage II -  Partial thickness loss of dermis presenting as a shallow open ulcer with a red, pink wound bed without slough.  Wound Description (Comments):   Present on Admission: Yes     Pressure Injury 02/04/18 Stage I -  Intact skin with non-blanchable redness of a localized area usually over a bony prominence. (Active)  02/04/18 0120   Location: Heel  Location Orientation: Left  Staging: Stage I -  Intact skin with non-blanchable redness of  a localized area usually over a bony prominence.  Wound Description (Comments):   Present on Admission: Yes    Subjective: Still feels short of breath.  She does have some productive cough and feels her breathing treatments are helping.     Objective: Vitals:   02/07/18 1600 02/07/18 1700 02/07/18 1709 02/07/18 1800  BP: 119/81     Pulse:  74 81  72  Resp: (!) 24 (!) 24  18  Temp:   97.6 F (36.4 C)   TempSrc:   Axillary   SpO2: 99% 97%  100%  Weight:      Height:        Intake/Output Summary (Last 24 hours) at 02/07/2018 1854 Last data filed at 02/07/2018 1709 Gross per 24 hour  Intake 2357.32 ml  Output 1275 ml  Net 1082.32 ml   Filed Weights   02/03/18 2035 02/04/18 0421 02/07/18 0500  Weight: 99.8 kg (!) 180.5 kg 111.5 kg     REVIEW OF SYSTEMS  As per history otherwise all reviewed and reported negative  Exam:  General exam: Alert, awake, oriented x 3 Respiratory system: Bilateral rhonchi. Respiratory effort normal. Cardiovascular system:RRR. No murmurs, rubs, gallops. Gastrointestinal system: Abdomen is nondistended, soft and nontender. No organomegaly or masses felt. Normal bowel sounds heard. Extremities: 2+ edema bilaterally Skin: No rashes, lesions or ulcers Psychiatry: Judgement and insight appear normal. Mood & affect appropriate.    Data Reviewed: Basic Metabolic Panel: Recent Labs  Lab 02/03/18 2127 02/04/18 0432 02/05/18 0456 02/05/18 0729 02/06/18 0719 02/07/18 0410  NA 134* 135 135  --  138 140  K 3.6 4.5 3.4*  --  3.5 4.0  CL 103 103 106  --  110 113*  CO2 24 26 24   --  23 23  GLUCOSE 110* 102* 86  --  89 112*  BUN 19 19 17   --  12 11  CREATININE 0.93 0.90 0.78  --  0.86 0.76  CALCIUM 7.5* 7.4* 6.8*  --  6.9* 7.3*  MG  --   --   --  1.7  --  1.9   Liver Function Tests: Recent Labs  Lab 02/04/18 0432 02/06/18 0719 02/07/18 0410  AST 12* 15 13*  ALT 8 9 9   ALKPHOS 114 108 111  BILITOT 0.6 0.6 0.9  PROT 4.9* 4.6* 4.8*  ALBUMIN 1.9* 1.8* 1.8*   No results for input(s): LIPASE, AMYLASE in the last 168 hours. No results for input(s): AMMONIA in the last 168 hours. CBC: Recent Labs  Lab 02/03/18 2127 02/04/18 0432 02/05/18 0456 02/06/18 0719 02/07/18 0410  WBC 5.2 7.4 5.8 7.0 6.9  NEUTROABS 4.4  --   --  5.8 5.7  HGB 9.0* 7.8* 6.6* 9.0* 9.4*  HCT 29.6* 25.5*  21.3* 29.0* 31.4*  MCV 94.0 94.4 94.7 90.1 92.1  PLT 192 170 164 146* 148*   Cardiac Enzymes: No results for input(s): CKTOTAL, CKMB, CKMBINDEX, TROPONINI in the last 168 hours. CBG (last 3)  No results for input(s): GLUCAP in the last 72 hours. Recent Results (from the past 240 hour(s))  Blood culture (routine x 2)     Status: None (Preliminary result)   Collection Time: 02/03/18 10:34 PM  Result Value Ref Range Status   Specimen Description BLOOD LEFT HAND  Final   Special Requests   Final    BOTTLES DRAWN AEROBIC AND ANAEROBIC Blood Culture adequate volume   Culture   Final    NO GROWTH 4 DAYS Performed at  [facility], [address] [place], [place] [address]    Report Status PENDING  Incomplete  Blood culture (routine x 2)     Status: None (Preliminary result)   Collection Time: 02/03/18 10:46 PM  Result Value Ref Range Status   Specimen Description RIGHT ANTECUBITAL  Final   Special Requests   Final    BOTTLES DRAWN AEROBIC AND ANAEROBIC Blood Culture adequate volume   Culture   Final    NO GROWTH 4 DAYS Performed at [facility], [address] [place], [place] [address]    Report Status PENDING  Incomplete  MRSA PCR Screening     Status: Abnormal   Collection Time: 02/04/18  1:30 AM  Result Value Ref Range Status   MRSA by PCR POSITIVE (A) NEGATIVE Final    Comment:        The GeneXpert MRSA Assay (FDA approved for NASAL specimens only), is one component of a comprehensive MRSA colonization surveillance program. It is not intended to diagnose MRSA infection nor to guide or monitor treatment for MRSA infections. RESULT CALLED TO, READ BACK BY AND VERIFIED WITH: Carlye Grippe R. AT 0935A ON [id number] BY THOMPSON S. Performed at [facility], [address] [place], [place] [address]      Studies: No results found. Scheduled Meds: . sodium chloride   Intravenous Once  . sodium chloride   Intravenous Once  . Chlorhexidine Gluconate Cloth  6 each Topical Daily  .  famotidine  20 mg Oral BID  . furosemide  20 mg Intravenous BID  . ipratropium-albuterol  3 mL Nebulization TID  . levothyroxine  150 mcg Oral Q0600  . loratadine  10 mg Oral Daily  . mouth rinse  15 mL Mouth Rinse BID  . mupirocin ointment  1 application Nasal BID  . pantoprazole  40 mg Oral Daily  . polyethylene glycol  17 g Oral Daily  . rivaroxaban  20 mg Oral Q supper  . senna  1 tablet Oral BID  . sertraline  50 mg Oral Daily  . sodium phosphate  1 enema Rectal Daily   Continuous Infusions: . sodium chloride 125 mL/hr at 02/06/18 2357  . ceFEPime (MAXIPIME) IV 2 g (02/07/18 1138)  . vancomycin 1,500 mg (02/07/18 1806)    Active Problems:   Hurthle cell neoplasm of thyroid   Paraplegia at T4 level (HCC)   Constipation   Constipation due to neurogenic bowel   Closed fracture of left distal femur (HCC)   Pressure injury of skin   Anemia   Chronic anticoagulation   Fecal impaction ([place])   HCAP (healthcare-associated pneumonia)  Time spent: 45mins  Kathie Dike, MD [facility] Pager [phone]  If 7PM-7AM, please contact night-coverage [email or web address] Password [email or web address] 02/07/2018, 6:54 PM    LOS: 3 days

## 2018-02-08 LAB — COMPREHENSIVE METABOLIC PANEL
ALT: 8 U/L (ref 0–44)
ANION GAP: 5 (ref 5–15)
AST: 12 U/L — ABNORMAL LOW (ref 15–41)
Albumin: 1.7 g/dL — ABNORMAL LOW (ref 3.5–5.0)
Alkaline Phosphatase: 117 U/L (ref 38–126)
BUN: 10 mg/dL (ref 6–20)
CALCIUM: 7.4 mg/dL — AB (ref 8.9–10.3)
CHLORIDE: 109 mmol/L (ref 98–111)
CO2: 24 mmol/L (ref 22–32)
Creatinine, Ser: 0.67 mg/dL (ref 0.44–1.00)
Glucose, Bld: 80 mg/dL (ref 70–99)
Potassium: 4.1 mmol/L (ref 3.5–5.1)
SODIUM: 138 mmol/L (ref 135–145)
Total Bilirubin: 1 mg/dL (ref 0.3–1.2)
Total Protein: 4.8 g/dL — ABNORMAL LOW (ref 6.5–8.1)

## 2018-02-08 LAB — CULTURE, BLOOD (ROUTINE X 2)
CULTURE: NO GROWTH
Culture: NO GROWTH
SPECIAL REQUESTS: ADEQUATE
Special Requests: ADEQUATE

## 2018-02-08 LAB — CBC WITH DIFFERENTIAL/PLATELET
Abs Immature Granulocytes: 0.36 10*3/uL — ABNORMAL HIGH (ref 0.00–0.07)
Basophils Absolute: 0.1 10*3/uL (ref 0.0–0.1)
Basophils Relative: 1 %
EOS ABS: 0.1 10*3/uL (ref 0.0–0.5)
EOS PCT: 2 %
HCT: 32.1 % — ABNORMAL LOW (ref 36.0–46.0)
Hemoglobin: 9.9 g/dL — ABNORMAL LOW (ref 12.0–15.0)
Immature Granulocytes: 5 %
Lymphocytes Relative: 5 %
Lymphs Abs: 0.4 10*3/uL — ABNORMAL LOW (ref 0.7–4.0)
MCH: 28.1 pg (ref 26.0–34.0)
MCHC: 30.8 g/dL (ref 30.0–36.0)
MCV: 91.2 fL (ref 80.0–100.0)
MONO ABS: 0.3 10*3/uL (ref 0.1–1.0)
Monocytes Relative: 4 %
Neutro Abs: 6.5 10*3/uL (ref 1.7–7.7)
Neutrophils Relative %: 83 %
PLATELETS: 126 10*3/uL — AB (ref 150–400)
RBC: 3.52 MIL/uL — AB (ref 3.87–5.11)
RDW: 16.4 % — ABNORMAL HIGH (ref 11.5–15.5)
WBC: 7.8 10*3/uL (ref 4.0–10.5)
nRBC: 0 % (ref 0.0–0.2)

## 2018-02-08 MED ORDER — PRO-STAT SUGAR FREE PO LIQD
30.0000 mL | Freq: Three times a day (TID) | ORAL | Status: DC
  Administered 2018-02-08 – 2018-02-23 (×41): 30 mL via ORAL
  Filled 2018-02-08 (×41): qty 30

## 2018-02-08 MED ORDER — ACETYLCYSTEINE 20 % IN SOLN
4.0000 mL | Freq: Two times a day (BID) | RESPIRATORY_TRACT | Status: DC
  Administered 2018-02-08: 4 mL via RESPIRATORY_TRACT
  Filled 2018-02-08: qty 4

## 2018-02-08 MED ORDER — ENSURE ENLIVE PO LIQD
237.0000 mL | Freq: Three times a day (TID) | ORAL | Status: DC
  Administered 2018-02-08 – 2018-02-20 (×29): 237 mL via ORAL

## 2018-02-08 MED ORDER — ACETYLCYSTEINE 20 % IN SOLN
4.0000 mL | Freq: Two times a day (BID) | RESPIRATORY_TRACT | Status: DC
  Administered 2018-02-08 – 2018-02-18 (×20): 4 mL via RESPIRATORY_TRACT
  Filled 2018-02-08 (×21): qty 4

## 2018-02-08 MED ORDER — ADULT MULTIVITAMIN W/MINERALS CH
1.0000 | ORAL_TABLET | Freq: Every day | ORAL | Status: DC
  Administered 2018-02-08 – 2018-02-23 (×16): 1 via ORAL
  Filled 2018-02-08 (×16): qty 1

## 2018-02-08 NOTE — Progress Notes (Signed)
PROGRESS NOTE    Sarah [name]  IZT:[id number]  DOB: 02/08/1966  DOA: 02/03/2018 PCP: [name], MD   Brief Admission Hx: 52 y.o. 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  Patient states that symptoms have been going on for past 3 days.  She also complains of nausea but no vomiting.  Denies any fever.  Denies chest pain.  Pt was admitted with a multifocal pneumonia.   MDM/Assessment & Plan:    1. Healthcare associated pneumonia-chest x-ray shows masslike opacity, likely infectious as it developed quickly since December 13, 2017.  CT confirms a multifocal pneumonia.  Pt was initially started on ceftriaxone and Zithromax but given the severity of infection and recent postop status, coverage was broadened to cefepime/vancomycin.  MRSA (+).  Patient will need repeat chest x-ray in 2 to 3 weeks to check further resolution of pneumonia.  Continue supportive care.  Pt says that she is feeling a little better.   Blood cultures NGTD.  Strep pneumo and legionella antigens negative.  Progress will be slow since she has a very weak cough reflex and poor inspiratory effort.  We will add Mucomyst nebs  2. History of pulmonary embolism-continue Xarelto for full anticoagulation.   3. 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 Pt has been treated with radiation and chemotherapy.  Follow up with oncology when discharged.    4. Hypothyroidism-continue Synthroid 5. Hypotension - soft blood pressures likely multifactorial from dehydration, infection, anemia, paraplegia.  Pt is asymptomatic and MAP>65.  Will follow closely in SDU.  PICC line in place if there is a need for pressor support.    6. Anemia - unspecified.  Hg down with hydration.  Transfused 2  units PRBC 11/15 with improvement of hemoglobin.  Appreciate GI consult and assistance.  CT abd/pelvis negative for retroperitoneal bleed or hematoma.  Hg up to 9 after transfusion.  Following.    7. Hypokalemia -replaced.  Magnesium is normal. 8. Poor IV access - PICC line in place.  9. Stage 2 sacral pressure injury and stage 1 left heel injury - wound care protocol.  10. Fecal impaction - seen on CT abdomen/pelvis -feels better after receiving enemas 11. Anasarca.  Related to hypoalbuminemia.  Started on low-dose Lasix.  Patient has had good urine output.  DVT Prophylaxis-   Xarelto  AM Labs Ordered, also please review Full Orders  Family Communication:  Discussed with family at the bedside  Code Status:  Full code  Consultants:  GI  Antimicrobials:  Ceftriaxone 11/14 >11/16  Azithromycin 11/14>11/16  Vancomycin 11/16 >  Cefepime 11/16 >  Pressure Injury Documentation: Pressure Injury 02/04/18 Stage II -  Partial thickness loss of dermis presenting as a shallow open ulcer with a red, pink wound bed without slough. (Active)  02/04/18 0130   Location: Sacrum  Location Orientation: Mid  Staging: Stage II -  Partial thickness loss of dermis presenting as a shallow open ulcer with a red, pink wound bed without slough.  Wound Description (Comments):   Present on Admission: Yes     Pressure Injury 02/04/18 Stage I -  Intact skin with non-blanchable redness of a localized area usually over a bony prominence. (Active)  02/04/18 0120   Location: Heel  Location Orientation: Left  Staging: Stage I -  Intact skin  with non-blanchable redness of a localized area usually over a bony prominence.  Wound Description (Comments):   Present on Admission: Yes    Subjective: Patient feels about the same.  Family feels that she is taking a deeper breath today and is producing more sputum with cough.  She feels that her lower extremity edema is a little better  today.  Objective: Vitals:   02/08/18 0700 02/08/18 0748 02/08/18 0800 02/08/18 0856  BP: 90/68  106/72   Pulse: 64 69 69   Resp: 17 17 16    Temp:  (!) 97.2 F (36.2 C)    TempSrc:  Axillary    SpO2: 100% 100% 99% 99%  Weight:      Height:        Intake/Output Summary (Last 24 hours) at 02/08/2018 1119 Last data filed at 02/08/2018 0754 Gross per 24 hour  Intake 5641.63 ml  Output 3900 ml  Net 1741.63 ml   Filed Weights   02/04/18 0421 02/07/18 0500 02/08/18 0500  Weight: (!) 180.5 kg 111.5 kg 111.8 kg     REVIEW OF SYSTEMS  As per history otherwise all reviewed and reported negative  Exam:  General exam: Alert, awake, oriented x 3 Respiratory system: bilateral rhonchi. Respiratory effort normal. Cardiovascular system:RRR. No murmurs, rubs, gallops. Gastrointestinal system: Abdomen is nondistended, soft and nontender. No organomegaly or masses felt. Normal bowel sounds heard. Central nervous system: Alert and oriented. No focal neurological deficits. Extremities: 1+ edema bilaterally Skin: No rashes, lesions or ulcers Psychiatry: Judgement and insight appear normal. Mood & affect appropriate.    Data Reviewed: Basic Metabolic Panel: Recent Labs  Lab 02/04/18 0432 02/05/18 0456 02/05/18 0729 02/06/18 0719 02/07/18 0410 02/08/18 0425  NA 135 135  --  138 140 138  K 4.5 3.4*  --  3.5 4.0 4.1  CL 103 106  --  110 113* 109  CO2 26 24  --  23 23 24   GLUCOSE 102* 86  --  89 112* 80  BUN 19 17  --  12 11 10   CREATININE 0.90 0.78  --  0.86 0.76 0.67  CALCIUM 7.4* 6.8*  --  6.9* 7.3* 7.4*  MG  --   --  1.7  --  1.9  --    Liver Function Tests: Recent Labs  Lab 02/04/18 0432 02/06/18 0719 02/07/18 0410 02/08/18 0425  AST 12* 15 13* 12*  ALT 8 9 9 8   ALKPHOS 114 108 111 117  BILITOT 0.6 0.6 0.9 1.0  PROT 4.9* 4.6* 4.8* 4.8*  ALBUMIN 1.9* 1.8* 1.8* 1.7*   No results for input(s): LIPASE, AMYLASE in the last 168 hours. No results for input(s): AMMONIA  in the last 168 hours. CBC: Recent Labs  Lab 02/03/18 2127 02/04/18 0432 02/05/18 0456 02/06/18 0719 02/07/18 0410 02/08/18 0425  WBC 5.2 7.4 5.8 7.0 6.9 7.8  NEUTROABS 4.4  --   --  5.8 5.7 6.5  HGB 9.0* 7.8* 6.6* 9.0* 9.4* 9.9*  HCT 29.6* 25.5* 21.3* 29.0* 31.4* 32.1*  MCV 94.0 94.4 94.7 90.1 92.1 91.2  PLT 192 170 164 146* 148* 126*   Cardiac Enzymes: No results for input(s): CKTOTAL, CKMB, CKMBINDEX, TROPONINI in the last 168 hours. CBG (last 3)  No results for input(s): GLUCAP in the last 72 hours. Recent Results (from the past 240 hour(s))  Blood culture (routine x 2)     Status: None   Collection Time: 02/03/18 10:34 PM  Result Value Ref Range Status  Specimen Description BLOOD LEFT HAND  Final   Special Requests   Final    BOTTLES DRAWN AEROBIC AND ANAEROBIC Blood Culture adequate volume   Culture   Final    NO GROWTH 5 DAYS Performed at [facility], [address] [place], [place] [address]    Report Status 02/08/2018 FINAL  Final  Blood culture (routine x 2)     Status: None   Collection Time: 02/03/18 10:46 PM  Result Value Ref Range Status   Specimen Description RIGHT ANTECUBITAL  Final   Special Requests   Final    BOTTLES DRAWN AEROBIC AND ANAEROBIC Blood Culture adequate volume   Culture   Final    NO GROWTH 5 DAYS Performed at [facility], [address] [place], [place] [address]    Report Status 02/08/2018 FINAL  Final  MRSA PCR Screening     Status: Abnormal   Collection Time: 02/04/18  1:30 AM  Result Value Ref Range Status   MRSA by PCR POSITIVE (A) NEGATIVE Final    Comment:        The GeneXpert MRSA Assay (FDA approved for NASAL specimens only), is one component of a comprehensive MRSA colonization surveillance program. It is not intended to diagnose MRSA infection nor to guide or monitor treatment for MRSA infections. RESULT CALLED TO, READ BACK BY AND VERIFIED WITH: Carlye Grippe R. AT 0935A ON [id number] BY THOMPSON S. Performed at  [facility], [address], [place], [place] [address]      Studies: No results found. Scheduled Meds: . sodium chloride   Intravenous Once  . sodium chloride   Intravenous Once  . Chlorhexidine Gluconate Cloth  6 each Topical Daily  . famotidine  20 mg Oral BID  . furosemide  20 mg Intravenous BID  . ipratropium-albuterol  3 mL Nebulization TID  . levothyroxine  150 mcg Oral Q0600  . loratadine  10 mg Oral Daily  . mouth rinse  15 mL Mouth Rinse BID  . mupirocin ointment  1 application Nasal BID  . pantoprazole  40 mg Oral Daily  . polyethylene glycol  17 g Oral Daily  . rivaroxaban  20 mg Oral Q supper  . senna  1 tablet Oral BID  . sertraline  50 mg Oral Daily  . sodium phosphate  1 enema Rectal Daily   Continuous Infusions: . ceFEPime (MAXIPIME) IV 2 g (02/08/18 1118)  . vancomycin 250 mL/hr at 02/08/18 0355    Active Problems:   Hurthle cell neoplasm of thyroid   Paraplegia at T4 level (HCC)   Constipation   Constipation due to neurogenic bowel   Closed fracture of left distal femur (HCC)   Pressure injury of skin   Anemia   Chronic anticoagulation   Fecal impaction ([place])   HCAP (healthcare-associated pneumonia)  Time spent: 17mins  Kathie Dike, MD [facility] Pager [phone]  If 7PM-7AM, please contact night-coverage [email or web address] Password [email or web address] 02/08/2018, 11:19 AM    LOS: 4 days

## 2018-02-08 NOTE — Progress Notes (Signed)
Initial Nutrition Assessment  DOCUMENTATION CODES:   Obesity unspecified  INTERVENTION:  Ensure Enlive po TID, each supplement provides 350 kcal and 20 grams of protein   ProStat 30 ml TID (each 30 ml provides 100 kcal, 15 gr protein)    Multivitamin   NUTRITION DIAGNOSIS:   Inadequate oral intake related to acute illness(pneumonia) as evidenced by meal completion < 25% (5 days).  GOAL:   Patient will meet greater than or equal to 90% of their needs  MONITOR:   Diet advancement, PO intake, Weight trends, Supplement acceptance, Labs, Skin  REASON FOR ASSESSMENT:   Low [place]    ASSESSMENT:  [name] an obese 52 yo female with hx of paraplegia associated with hx of metastatic thyroid carcinoma with metastasis to bone. She presents with shortness of breath and productive cough. CT findings show pneumonia and fecal impaction. Anasarca and stage 2 (sacrum) pressure injury and stage 1 left heel.  [name] from home with good family suport. She affirms usual intake as 3 meals daily but 2 days prior to admission prevalent poor appetite which has persisted since admission. PO meal intake poor-20% of clear/full liquid diet. Able to feed herself. Poor dentition observed. Inadequate oral intake in the setting of increased needs with acute infection and metastatic disease. We talked about importance of nutrition intake and discussed option of oral supplements due to her poor appetite. Will provide ONS until appetite/ meal intake is better.   Significant weight gain according to records -(11.5%) the past 2 weeks from 100.2 kg up to currently 111.8 kg. Patient reports usual weight of 215-216 lb. Edema to right and left lower extremities.    Intake/Output Summary (Last 24 hours) at 02/08/2018 1441 Last data filed at 02/08/2018 1118 Gross per 24 hour  Intake 5641.63 ml  Output 4900 ml  Net 741.63 ml     Medications reviewed and include: Pepcid, Lasix, Protonix, Senokot,  Zoloft  Labs: BMP Latest Ref Rng & Units 02/08/2018 02/07/2018 02/06/2018  Glucose 70 - 99 mg/dL 80 112(H) 89  BUN 6 - 20 mg/dL 10 11 12   Creatinine 0.44 - 1.00 mg/dL 0.67 0.76 0.86  BUN/Creat Ratio 9 - 23 - - -  Sodium 135 - 145 mmol/L 138 140 138  Potassium 3.5 - 5.1 mmol/L 4.1 4.0 3.5  Chloride 98 - 111 mmol/L 109 113(H) 110  CO2 22 - 32 mmol/L 24 23 23   Calcium 8.9 - 10.3 mg/dL 7.4(L) 7.3(L) 6.9(L)     NUTRITION - FOCUSED PHYSICAL EXAM: patient is a paraplegic. Mild temporal muscle loss.    Diet Order:   Diet Order            Diet full liquid Room service appropriate? Yes; Fluid consistency: Thin  Diet effective now              EDUCATION NEEDS:   Education needs have been addressed Skin:  Skin Assessment: Skin Integrity Issues: Skin Integrity Issues:: Stage II, Stage I Stage I: left heel Stage II: sacrum   Last BM:  11/17  Height:   Ht Readings from Last 1 Encounters:  02/04/18 5\' 11"  (1.803 m)    Weight:   Wt Readings from Last 1 Encounters:  02/08/18 111.8 kg    Ideal Body Weight:  63 kg(adjusted for paraplegia)  BMI:  Body mass index is 34.38 kg/m.  Estimated Nutritional Needs:   Kcal:  1900-2100 (19-21 kcal/kg/bw)  Protein:  113-126 gr (1.8-2.0 Adj IBW)  Fluid:  1.9-2.1 liters daily  Colman Cater MS,RD,CSG,LDN Office: [phone] Pager: [phone]

## 2018-02-09 LAB — COMPREHENSIVE METABOLIC PANEL
ALT: 7 U/L (ref 0–44)
ANION GAP: 5 (ref 5–15)
AST: 15 U/L (ref 15–41)
Albumin: 1.7 g/dL — ABNORMAL LOW (ref 3.5–5.0)
Alkaline Phosphatase: 117 U/L (ref 38–126)
BILIRUBIN TOTAL: 0.8 mg/dL (ref 0.3–1.2)
BUN: 14 mg/dL (ref 6–20)
CO2: 26 mmol/L (ref 22–32)
Calcium: 7.5 mg/dL — ABNORMAL LOW (ref 8.9–10.3)
Chloride: 107 mmol/L (ref 98–111)
Creatinine, Ser: 1.04 mg/dL — ABNORMAL HIGH (ref 0.44–1.00)
GFR calc Af Amer: >60 mL/min (ref >60)
GFR calc non Af Amer: >60 mL/min (ref >60)
GLUCOSE: 105 mg/dL — AB (ref 70–99)
POTASSIUM: 3.5 mmol/L (ref 3.5–5.1)
SODIUM: 138 mmol/L (ref 135–145)
Total Protein: 5 g/dL — ABNORMAL LOW (ref 6.5–8.1)

## 2018-02-09 LAB — CBC WITH DIFFERENTIAL/PLATELET
Abs Immature Granulocytes: 0.33 10*3/uL — ABNORMAL HIGH (ref 0.00–0.07)
Basophils Absolute: 0 10*3/uL (ref 0.0–0.1)
Basophils Relative: 0 %
EOS ABS: 0.1 10*3/uL (ref 0.0–0.5)
Eosinophils Relative: 2 %
HEMATOCRIT: 31.5 % — AB (ref 36.0–46.0)
Hemoglobin: 9.7 g/dL — ABNORMAL LOW (ref 12.0–15.0)
IMMATURE GRANULOCYTES: 4 %
LYMPHS ABS: 0.5 10*3/uL — AB (ref 0.7–4.0)
Lymphocytes Relative: 6 %
MCH: 28 pg (ref 26.0–34.0)
MCHC: 30.8 g/dL (ref 30.0–36.0)
MCV: 90.8 fL (ref 80.0–100.0)
Monocytes Absolute: 0.3 10*3/uL (ref 0.1–1.0)
Monocytes Relative: 4 %
NEUTROS PCT: 84 %
NRBC: 0 % (ref 0.0–0.2)
Neutro Abs: 6.9 10*3/uL (ref 1.7–7.7)
PLATELETS: 137 10*3/uL — AB (ref 150–400)
RBC: 3.47 MIL/uL — ABNORMAL LOW (ref 3.87–5.11)
RDW: 16.4 % — AB (ref 11.5–15.5)
WBC: 8.2 10*3/uL (ref 4.0–10.5)

## 2018-02-09 LAB — VANCOMYCIN, TROUGH: Vancomycin Tr: 50 ug/mL (ref 15–20)

## 2018-02-09 MED ORDER — METHYLPREDNISOLONE SODIUM SUCC 125 MG IJ SOLR
60.0000 mg | Freq: Two times a day (BID) | INTRAMUSCULAR | Status: DC
  Administered 2018-02-09 – 2018-02-11 (×6): 60 mg via INTRAVENOUS
  Filled 2018-02-09 (×6): qty 2

## 2018-02-09 MED ORDER — SODIUM CHLORIDE 0.9 % IV SOLN
2.0000 g | Freq: Three times a day (TID) | INTRAVENOUS | Status: DC
  Administered 2018-02-09 – 2018-02-11 (×7): 2 g via INTRAVENOUS
  Filled 2018-02-09 (×10): qty 2

## 2018-02-09 NOTE — Care Management Note (Signed)
Case Management Note  Patient Details  Name: Sarah [name] MRN: [id number] Date of Birth: 07/21/1965    If discussed at Long Length of Stay Meetings, dates discussed:  02/09/2018  Additional Comments:  Ricky Gallery, Chauncey Reading, RN 02/09/2018, 3:30 PM

## 2018-02-09 NOTE — Progress Notes (Signed)
PROGRESS NOTE    Sarah [name]  CZY:[id number]  DOB: 1965-07-12  DOA: 02/03/2018 PCP: [name], MD   Brief Admission Hx: 52 y.o. 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  Patient states that symptoms have been going on for past 3 days.  She also complains of nausea but no vomiting.  Denies any fever.  Denies chest pain.  Pt was admitted with a multifocal pneumonia.    MDM/Assessment & Plan:    1. Healthcare associated pneumonia-chest x-ray shows masslike opacity, likely infectious as it developed quickly since December 13, 2017.  CT confirms a multifocal pneumonia.  Pt was initially started on ceftriaxone and Zithromax but given the severity of infection and recent postop status, coverage was broadened to cefepime/vancomycin.  MRSA (+).  Patient will need repeat chest x-ray in 2 to 3 weeks to check further resolution of pneumonia.  Blood cultures NGTD.  Strep pneumo and legionella antigens negative.  Progress will be slow since she has a very weak cough reflex and poor inspiratory effort.  Continue mucomyst nebs. Will add steroids since she does have some wheezing.   2. History of pulmonary embolism-continue Xarelto for full anticoagulation.   3. 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 Pt has been treated with radiation and chemotherapy.  Follow up with oncology when discharged.    4. Hypothyroidism-continue Synthroid 5. Hypotension - soft blood pressures likely multifactorial from dehydration, infection, anemia, paraplegia.  Pt is asymptomatic and MAP>65.  Will follow closely in SDU.  PICC line in place if there is a need for pressor support.    6. Anemia - unspecified.  Hg down with hydration.  Transfused 2 units PRBC 11/15 with  improvement of hemoglobin.  Appreciate GI consult and assistance.  CT abd/pelvis negative for retroperitoneal bleed or hematoma.  Hg up to 9 after transfusion.  Following.    7. Hypokalemia -replaced.  Magnesium is normal. 8. Stage 2 sacral pressure injury and stage 1 left heel injury - wound care protocol.  9. Fecal impaction - seen on CT abdomen/pelvis -feels better after receiving enemas 10. Anasarca.  Related to hypoalbuminemia.  Started on low-dose Lasix.  Patient has had good urine output. Lasix has been held due to rising creatinine  DVT Prophylaxis-   Xarelto  AM Labs Ordered, also please review Full Orders  Family Communication:  Discussed with family at the bedside  Code Status:  Full code  Consultants:  GI  Antimicrobials:  Ceftriaxone 11/14 >11/16  Azithromycin 11/14>11/16  Vancomycin 11/16 >11/19  Cefepime 11/16 >  Pressure Injury Documentation: Pressure Injury 02/04/18 Stage II -  Partial thickness loss of dermis presenting as a shallow open ulcer with a red, pink wound bed without slough. (Active)  02/04/18 0130   Location: Sacrum  Location Orientation: Mid  Staging: Stage II -  Partial thickness loss of dermis presenting as a shallow open ulcer with a red, pink wound bed without slough.  Wound Description (Comments):   Present on Admission: Yes     Pressure Injury 02/04/18 Stage I -  Intact skin with non-blanchable redness of a localized area usually over a bony prominence. (Active)  02/04/18 0120   Location: Heel  Location Orientation: Left  Staging: Stage I -  Intact skin with non-blanchable redness of a localized area usually  over a bony prominence.  Wound Description (Comments):   Present on Admission: Yes    Subjective: [name] having difficulty expectorating sputum. Still feels short of breath and weak.  Objective: Vitals:   02/09/18 1200 02/09/18 1300 02/09/18 1400 02/09/18 1604  BP: 93/61 93/67    Pulse: 86 96 81 79  Resp: (!) 22  (!) 26 19 19   Temp:    97.7 F (36.5 C)  TempSrc:    Oral  SpO2:   97% 93%  Weight:      Height:        Intake/Output Summary (Last 24 hours) at 02/09/2018 1609 Last data filed at 02/09/2018 1511 Gross per 24 hour  Intake 1038.21 ml  Output 500 ml  Net 538.21 ml   Filed Weights   02/04/18 0421 02/07/18 0500 02/08/18 0500  Weight: (!) 180.5 kg 111.5 kg 111.8 kg     REVIEW OF SYSTEMS  As per history otherwise all reviewed and reported negative  Exam:  General exam: Alert, awake, oriented x 3 Respiratory system: bilateral rhonchi and wheezing. Respiratory effort normal. Cardiovascular system:RRR. No murmurs, rubs, gallops. Gastrointestinal system: Abdomen is nondistended, soft and nontender. No organomegaly or masses felt. Normal bowel sounds heard. Central nervous system: Alert and oriented. No focal neurological deficits. Extremities: 2+ edema bilaterally Skin: No rashes, lesions or ulcers Psychiatry: Judgement and insight appear normal. Mood & affect appropriate.     Data Reviewed: Basic Metabolic Panel: Recent Labs  Lab 02/05/18 0456 02/05/18 0729 02/06/18 0719 02/07/18 0410 02/08/18 0425 02/09/18 0603  NA 135  --  138 140 138 138  K 3.4*  --  3.5 4.0 4.1 3.5  CL 106  --  110 113* 109 107  CO2 24  --  23 23 24 26   GLUCOSE 86  --  89 112* 80 105*  BUN 17  --  12 11 10 14   CREATININE 0.78  --  0.86 0.76 0.67 1.04*  CALCIUM 6.8*  --  6.9* 7.3* 7.4* 7.5*  MG  --  1.7  --  1.9  --   --    Liver Function Tests: Recent Labs  Lab 02/04/18 0432 02/06/18 0719 02/07/18 0410 02/08/18 0425 02/09/18 0603  AST 12* 15 13* 12* 15  ALT 8 9 9 8 7   ALKPHOS 114 108 111 117 117  BILITOT 0.6 0.6 0.9 1.0 0.8  PROT 4.9* 4.6* 4.8* 4.8* 5.0*  ALBUMIN 1.9* 1.8* 1.8* 1.7* 1.7*   No results for input(s): LIPASE, AMYLASE in the last 168 hours. No results for input(s): AMMONIA in the last 168 hours. CBC: Recent Labs  Lab 02/03/18 2127  02/05/18 0456 02/06/18 0719  02/07/18 0410 02/08/18 0425 02/09/18 0603  WBC 5.2   < > 5.8 7.0 6.9 7.8 8.2  NEUTROABS 4.4  --   --  5.8 5.7 6.5 6.9  HGB 9.0*   < > 6.6* 9.0* 9.4* 9.9* 9.7*  HCT 29.6*   < > 21.3* 29.0* 31.4* 32.1* 31.5*  MCV 94.0   < > 94.7 90.1 92.1 91.2 90.8  PLT 192   < > 164 146* 148* 126* 137*   < > = values in this interval not displayed.   Cardiac Enzymes: No results for input(s): CKTOTAL, CKMB, CKMBINDEX, TROPONINI in the last 168 hours. CBG (last 3)  No results for input(s): GLUCAP in the last 72 hours. Recent Results (from the past 240 hour(s))  Blood culture (routine x 2)     Status: None  Collection Time: 02/03/18 10:34 PM  Result Value Ref Range Status   Specimen Description BLOOD LEFT HAND  Final   Special Requests   Final    BOTTLES DRAWN AEROBIC AND ANAEROBIC Blood Culture adequate volume   Culture   Final    NO GROWTH 5 DAYS Performed at [facility], [address], [place], [place] [address]    Report Status 02/08/2018 FINAL  Final  Blood culture (routine x 2)     Status: None   Collection Time: 02/03/18 10:46 PM  Result Value Ref Range Status   Specimen Description RIGHT ANTECUBITAL  Final   Special Requests   Final    BOTTLES DRAWN AEROBIC AND ANAEROBIC Blood Culture adequate volume   Culture   Final    NO GROWTH 5 DAYS Performed at [facility], [address] [place], [place] [address]    Report Status 02/08/2018 FINAL  Final  MRSA PCR Screening     Status: Abnormal   Collection Time: 02/04/18  1:30 AM  Result Value Ref Range Status   MRSA by PCR POSITIVE (A) NEGATIVE Final    Comment:        The GeneXpert MRSA Assay (FDA approved for NASAL specimens only), is one component of a comprehensive MRSA colonization surveillance program. It is not intended to diagnose MRSA infection nor to guide or monitor treatment for MRSA infections. RESULT CALLED TO, READ BACK BY AND VERIFIED WITH: Carlye Grippe R. AT 0935A ON [id number] BY THOMPSON S. Performed at [facility], [address] [place], [place] [address]      Studies: No results found. Scheduled Meds: . sodium chloride   Intravenous Once  . sodium chloride   Intravenous Once  . acetylcysteine  4 mL Nebulization BID  . Chlorhexidine Gluconate Cloth  6 each Topical Daily  . famotidine  20 mg Oral BID  . feeding supplement (ENSURE ENLIVE)  237 mL Oral TID BM  . feeding supplement (PRO-STAT SUGAR FREE 64)  30 mL Oral TID BM  . ipratropium-albuterol  3 mL Nebulization TID  . levothyroxine  150 mcg Oral Q0600  . loratadine  10 mg Oral Daily  . mouth rinse  15 mL Mouth Rinse BID  . methylPREDNISolone (SOLU-MEDROL) injection  60 mg Intravenous Q12H  . multivitamin with minerals  1 tablet Oral Daily  . pantoprazole  40 mg Oral Daily  . polyethylene glycol  17 g Oral Daily  . rivaroxaban  20 mg Oral Q supper  . senna  1 tablet Oral BID  . sertraline  50 mg Oral Daily  . sodium phosphate  1 enema Rectal Daily   Continuous Infusions: . ceFEPime (MAXIPIME) IV Stopped (02/09/18 1457)    Active Problems:   Hurthle cell neoplasm of thyroid   Paraplegia at T4 level (HCC)   Constipation   Constipation due to neurogenic bowel   Closed fracture of left distal femur (HCC)   Pressure injury of skin   Anemia   Chronic anticoagulation   Fecal impaction ([place])   HCAP (healthcare-associated pneumonia)  Time spent: 71mins  Kathie Dike, MD [facility] Pager [phone]  If 7PM-7AM, please contact night-coverage [email or web address] Password [email or web address] 02/09/2018, 4:09 PM    LOS: 5 days

## 2018-02-09 NOTE — Progress Notes (Signed)
Pharmacy Antibiotic Note  Sarah [name] is a 52 y.o. female admitted on 02/03/2018  With healthcare associate pneumonia.  Pharmacy has been consulted for cefepime dosing. Vancomycin trough elevated, discussed with MD, will discontinue. [name] slow to progress, but improving. Patient feeling better. Afebrile and WBC normal  Plan: Continue cefepime 2gm IV q8h for total of 7 days F/u cxs and clinical progress  Monitor V/S and labs  Height: 5\' 11"  (180.3 cm) Weight: 246 lb 7.6 oz (111.8 kg) IBW/kg (Calculated) : 70.8  Temp (24hrs), Avg:97.5 F (36.4 C), Min:96.3 F (35.7 C), Max:98.4 F (36.9 C)  Recent Labs  Lab 02/03/18 2136  02/05/18 0456 02/06/18 0719 02/07/18 0410 02/08/18 0425 02/09/18 0603  WBC  --    < > 5.8 7.0 6.9 7.8 8.2  CREATININE  --    < > 0.78 0.86 0.76 0.67 1.04*  LATICACIDVEN 1.02  --   --   --   --   --   --   VANCOTROUGH  --   --   --   --   --   --  50*   < > = values in this interval not displayed.    Estimated Creatinine Clearance: 88.1 mL/min (A) (by C-G formula based on SCr of 1.04 mg/dL (H)).    Allergies  Allergen Reactions  . Bee Venom Anaphylaxis  . Keflex [Cephalexin] Nausea And Vomiting  . Tramadol Nausea And Vomiting  . Gabapentin Nausea And Vomiting  . Heparin Other (See Comments)    Weak positive platelets induced antibodies, SRA negative--2015  . Hydrochlorothiazide Nausea And Vomiting  . Hydrocodone Nausea And Vomiting  . Oxycodone-Acetaminophen Nausea And Vomiting  . Sorbitol Nausea Only    And abdominal distress  . Tizanidine     Fatigue    Anti-infectives (From admission, onward)   Start     Dose/Rate Route Frequency Ordered Stop   02/06/18 1900  vancomycin (VANCOCIN) 1,500 mg in sodium chloride 0.9 % 500 mL IVPB  Status:  Discontinued     1,500 mg 250 mL/hr over 120 Minutes Intravenous Every 12 hours 02/06/18 0827 02/09/18 1019   02/06/18 0830  vancomycin (VANCOCIN) 2,000 mg in sodium chloride 0.9 % 500 mL IVPB     2,000 mg 250 mL/hr over 120 Minutes Intravenous  Once 02/06/18 0826 02/06/18 1237   02/06/18 0830  ceFEPIme (MAXIPIME) 2 g in sodium chloride 0.9 % 100 mL IVPB     2 g 200 mL/hr over 30 Minutes Intravenous Every 8 hours 02/06/18 0826     02/05/18 1500  cefTRIAXone (ROCEPHIN) 1 g in sodium chloride 0.9 % 100 mL IVPB  Status:  Discontinued     1 g 200 mL/hr over 30 Minutes Intravenous Every 24 hours 02/04/18 0412 02/04/18 1421   02/04/18 2200  cefTRIAXone (ROCEPHIN) 1 g in sodium chloride 0.9 % 100 mL IVPB  Status:  Discontinued     1 g 200 mL/hr over 30 Minutes Intravenous Every 24 hours 02/04/18 1421 02/06/18 0642   02/04/18 1300  azithromycin (ZITHROMAX) 500 mg in sodium chloride 0.9 % 250 mL IVPB  Status:  Discontinued     500 mg 250 mL/hr over 60 Minutes Intravenous Every 24 hours 02/04/18 0412 02/06/18 0642   02/03/18 2215  cefTRIAXone (ROCEPHIN) 1 g in sodium chloride 0.9 % 100 mL IVPB     1 g 200 mL/hr over 30 Minutes Intravenous  Once 02/03/18 2214 02/03/18 2322   02/03/18 2215  azithromycin (ZITHROMAX) 500 mg  in sodium chloride 0.9 % 250 mL IVPB     500 mg 250 mL/hr over 60 Minutes Intravenous  Once 02/03/18 2214 02/04/18 0024      Antimicrobials this admission: 11/13 azithromycin >> 11/14 11/13 rocephin >> 11/15 11/16 cefepime >> 11/16 vancomycin >> 11/19   Microbiology results: 11/13 BCx: ngtd 11/14 MRSA PCR: positive  Thank you for allowing pharmacy to be a part of this patient's care.  Cristy Friedlander 02/09/2018 10:19 AM

## 2018-02-10 LAB — COMPREHENSIVE METABOLIC PANEL
ALK PHOS: 120 U/L (ref 38–126)
ALT: 8 U/L (ref 0–44)
AST: 15 U/L (ref 15–41)
Albumin: 1.6 g/dL — ABNORMAL LOW (ref 3.5–5.0)
Anion gap: 6 (ref 5–15)
BUN: 19 mg/dL (ref 6–20)
CO2: 26 mmol/L (ref 22–32)
CREATININE: 1.15 mg/dL — AB (ref 0.44–1.00)
Calcium: 7.8 mg/dL — ABNORMAL LOW (ref 8.9–10.3)
Chloride: 105 mmol/L (ref 98–111)
GFR calc Af Amer: >60 mL/min (ref >60)
GFR, EST NON AFRICAN AMERICAN: 54 mL/min — AB (ref >60)
Glucose, Bld: 141 mg/dL — ABNORMAL HIGH (ref 70–99)
Potassium: 4.3 mmol/L (ref 3.5–5.1)
Sodium: 137 mmol/L (ref 135–145)
Total Bilirubin: 0.7 mg/dL (ref 0.3–1.2)
Total Protein: 5.2 g/dL — ABNORMAL LOW (ref 6.5–8.1)

## 2018-02-10 LAB — CBC WITH DIFFERENTIAL/PLATELET
ABS IMMATURE GRANULOCYTES: 0.42 10*3/uL — AB (ref 0.00–0.07)
Basophils Absolute: 0 10*3/uL (ref 0.0–0.1)
Basophils Relative: 0 %
Eosinophils Absolute: 0 10*3/uL (ref 0.0–0.5)
Eosinophils Relative: 0 %
HEMATOCRIT: 30.8 % — AB (ref 36.0–46.0)
HEMOGLOBIN: 9.5 g/dL — AB (ref 12.0–15.0)
IMMATURE GRANULOCYTES: 5 %
LYMPHS ABS: 0.4 10*3/uL — AB (ref 0.7–4.0)
LYMPHS PCT: 5 %
MCH: 28.3 pg (ref 26.0–34.0)
MCHC: 30.8 g/dL (ref 30.0–36.0)
MCV: 91.7 fL (ref 80.0–100.0)
Monocytes Absolute: 0.2 10*3/uL (ref 0.1–1.0)
Monocytes Relative: 2 %
NEUTROS ABS: 6.9 10*3/uL (ref 1.7–7.7)
NEUTROS PCT: 88 %
PLATELETS: 124 10*3/uL — AB (ref 150–400)
RBC: 3.36 MIL/uL — ABNORMAL LOW (ref 3.87–5.11)
RDW: 16.1 % — ABNORMAL HIGH (ref 11.5–15.5)
WBC: 7.9 10*3/uL (ref 4.0–10.5)
nRBC: 0 % (ref 0.0–0.2)

## 2018-02-10 NOTE — Progress Notes (Signed)
PROGRESS NOTE    Sarah [name]  YQI:[id number]  DOB: 03-06-1966  DOA: 02/03/2018 PCP: [name], MD   Brief Admission Hx: 52 y.o. 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  Patient states that symptoms have been going on for past 3 days.  She also complains of nausea but no vomiting.  Denies any fever.  Denies chest pain.  Pt was admitted with a multifocal pneumonia.    MDM/Assessment & Plan:    1. Healthcare associated pneumonia-chest x-ray shows masslike opacity, likely infectious as it developed quickly since December 13, 2017.  CT confirms a multifocal pneumonia.  Pt was initially started on ceftriaxone and Zithromax but given the severity of infection and recent postop status, coverage was broadened to cefepime/vancomycin.  MRSA (+).  Patient will need repeat chest x-ray in 2 to 3 weeks to check further resolution of pneumonia.  Blood cultures NGTD.  Strep pneumo and legionella antigens negative.  Progress will be slow since she has a very weak cough reflex and poor inspiratory effort.  Continue mucomyst nebs. Continue steroids.   2. History of pulmonary embolism-continue Xarelto for full anticoagulation.   3. 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 Pt has been treated with radiation and chemotherapy.  Follow up with oncology when discharged.    4. Hypothyroidism-continue Synthroid 5. Hypotension - soft blood pressures likely multifactorial from dehydration, infection, anemia, paraplegia.  Pt is asymptomatic and MAP>65.     6. Anemia - unspecified.  Transfused 2 units PRBC 11/15 with improvement of hemoglobin.  Appreciate GI consult and assistance.  No gross GI bleeding. CT abd/pelvis negative for retroperitoneal bleed or  hematoma.  Hg up to 9 after transfusion.  Following.    7. Hypokalemia -replaced.  Magnesium is normal. 8. Stage 2 sacral pressure injury and stage 1 left heel injury - wound care protocol.  9. Fecal impaction - seen on CT abdomen/pelvis -feels better after receiving enemas 10. Anasarca.  Related to hypoalbuminemia.  Started on low-dose Lasix.  Patient has had good urine output. Lasix has been held due to rising creatinine  DVT Prophylaxis-   Xarelto  AM Labs Ordered, also please review Full Orders  Family Communication:  Discussed with family at the bedside  Code Status:  Full code  Consultants:  GI  Antimicrobials:  Ceftriaxone 11/14 >11/16  Azithromycin 11/14>11/16  Vancomycin 11/16 >11/19  Cefepime 11/16 >  Pressure Injury Documentation: Pressure Injury 02/04/18 Stage II -  Partial thickness loss of dermis presenting as a shallow open ulcer with a red, pink wound bed without slough. (Active)  02/04/18 0130   Location: Sacrum  Location Orientation: Mid  Staging: Stage II -  Partial thickness loss of dermis presenting as a shallow open ulcer with a red, pink wound bed without slough.  Wound Description (Comments):   Present on Admission: Yes     Pressure Injury 02/04/18 Stage I -  Intact skin with non-blanchable redness of a localized area usually over a bony prominence. (Active)  02/04/18 0120   Location: Heel  Location Orientation: Left  Staging: Stage I -  Intact skin with non-blanchable redness of a localized area usually over a bony prominence.  Wound Description (Comments):   Present on Admission: Yes    Subjective: She continues to bring up small amounts of  sputum.  Overall she is feeling slowly better.  She feels weak.  Objective: Vitals:   02/10/18 1000 02/10/18 1132 02/10/18 1200 02/10/18 1533  BP: 110/68  130/77   Pulse: 87  83   Resp: (!) 23  (!) 24   Temp:  98.9 F (37.2 C)    TempSrc:  Oral    SpO2: 90%  96% 94%  Weight:      Height:         Intake/Output Summary (Last 24 hours) at 02/10/2018 1847 Last data filed at 02/10/2018 1800 Gross per 24 hour  Intake 200 ml  Output 1300 ml  Net -1100 ml   Filed Weights   02/07/18 0500 02/08/18 0500 02/10/18 0427  Weight: 111.5 kg 111.8 kg 111 kg     REVIEW OF SYSTEMS  As per history otherwise all reviewed and reported negative  Exam:  General exam: Alert, awake, oriented x 3 Respiratory system: bilateral rhonchi with mild wheezing. Respiratory effort normal. Cardiovascular system:RRR. No murmurs, rubs, gallops. Gastrointestinal system: Abdomen is nondistended, soft and nontender. No organomegaly or masses felt. Normal bowel sounds heard. Central nervous system: Alert and oriented. No focal neurological deficits. Extremities: 1-2+ edema bilaterally Skin: No rashes, lesions or ulcers Psychiatry: Judgement and insight appear normal. Mood & affect appropriate.       Data Reviewed: Basic Metabolic Panel: Recent Labs  Lab 02/05/18 0729 02/06/18 0719 02/07/18 0410 02/08/18 0425 02/09/18 0603 02/10/18 0408  NA  --  138 140 138 138 137  K  --  3.5 4.0 4.1 3.5 4.3  CL  --  110 113* 109 107 105  CO2  --  23 23 24 26 26   GLUCOSE  --  89 112* 80 105* 141*  BUN  --  12 11 10 14 19   CREATININE  --  0.86 0.76 0.67 1.04* 1.15*  CALCIUM  --  6.9* 7.3* 7.4* 7.5* 7.8*  MG 1.7  --  1.9  --   --   --    Liver Function Tests: Recent Labs  Lab 02/06/18 0719 02/07/18 0410 02/08/18 0425 02/09/18 0603 02/10/18 0408  AST 15 13* 12* 15 15  ALT 9 9 8 7 8   ALKPHOS 108 111 117 117 120  BILITOT 0.6 0.9 1.0 0.8 0.7  PROT 4.6* 4.8* 4.8* 5.0* 5.2*  ALBUMIN 1.8* 1.8* 1.7* 1.7* 1.6*   No results for input(s): LIPASE, AMYLASE in the last 168 hours. No results for input(s): AMMONIA in the last 168 hours. CBC: Recent Labs  Lab 02/06/18 0719 02/07/18 0410 02/08/18 0425 02/09/18 0603 02/10/18 0408  WBC 7.0 6.9 7.8 8.2 7.9  NEUTROABS 5.8 5.7 6.5 6.9 6.9  HGB 9.0* 9.4*  9.9* 9.7* 9.5*  HCT 29.0* 31.4* 32.1* 31.5* 30.8*  MCV 90.1 92.1 91.2 90.8 91.7  PLT 146* 148* 126* 137* 124*   Cardiac Enzymes: No results for input(s): CKTOTAL, CKMB, CKMBINDEX, TROPONINI in the last 168 hours. CBG (last 3)  No results for input(s): GLUCAP in the last 72 hours. Recent Results (from the past 240 hour(s))  Blood culture (routine x 2)     Status: None   Collection Time: 02/03/18 10:34 PM  Result Value Ref Range Status   Specimen Description BLOOD LEFT HAND  Final   Special Requests   Final    BOTTLES DRAWN AEROBIC AND ANAEROBIC Blood Culture adequate volume   Culture   Final    NO GROWTH 5 DAYS Performed at [facility], [address] [place],  [place] [address]    Report Status 02/08/2018 FINAL  Final  Blood culture (routine x 2)     Status: None   Collection Time: 02/03/18 10:46 PM  Result Value Ref Range Status   Specimen Description RIGHT ANTECUBITAL  Final   Special Requests   Final    BOTTLES DRAWN AEROBIC AND ANAEROBIC Blood Culture adequate volume   Culture   Final    NO GROWTH 5 DAYS Performed at [facility], [address] [place], [place] [address]    Report Status 02/08/2018 FINAL  Final  MRSA PCR Screening     Status: Abnormal   Collection Time: 02/04/18  1:30 AM  Result Value Ref Range Status   MRSA by PCR POSITIVE (A) NEGATIVE Final    Comment:        The GeneXpert MRSA Assay (FDA approved for NASAL specimens only), is one component of a comprehensive MRSA colonization surveillance program. It is not intended to diagnose MRSA infection nor to guide or monitor treatment for MRSA infections. RESULT CALLED TO, READ BACK BY AND VERIFIED WITH: Carlye Grippe R. AT 0935A ON [id number] BY THOMPSON S. Performed at [facility], [address] [place], [place] [address]      Studies: No results found. Scheduled Meds: . sodium chloride   Intravenous Once  . sodium chloride   Intravenous Once  . acetylcysteine  4 mL Nebulization BID  .  Chlorhexidine Gluconate Cloth  6 each Topical Daily  . famotidine  20 mg Oral BID  . feeding supplement (ENSURE ENLIVE)  237 mL Oral TID BM  . feeding supplement (PRO-STAT SUGAR FREE 64)  30 mL Oral TID BM  . ipratropium-albuterol  3 mL Nebulization TID  . levothyroxine  150 mcg Oral Q0600  . loratadine  10 mg Oral Daily  . mouth rinse  15 mL Mouth Rinse BID  . methylPREDNISolone (SOLU-MEDROL) injection  60 mg Intravenous Q12H  . multivitamin with minerals  1 tablet Oral Daily  . pantoprazole  40 mg Oral Daily  . polyethylene glycol  17 g Oral Daily  . rivaroxaban  20 mg Oral Q supper  . senna  1 tablet Oral BID  . sertraline  50 mg Oral Daily  . sodium phosphate  1 enema Rectal Daily   Continuous Infusions: . ceFEPime (MAXIPIME) IV 200 mL/hr at 02/10/18 1300    Active Problems:   Hurthle cell neoplasm of thyroid   Paraplegia at T4 level (HCC)   Constipation   Constipation due to neurogenic bowel   Closed fracture of left distal femur (HCC)   Pressure injury of skin   Anemia   Chronic anticoagulation   Fecal impaction ([place])   HCAP (healthcare-associated pneumonia)  Time spent: 3mins  Kathie Dike, MD [facility] Pager [phone]  If 7PM-7AM, please contact night-coverage [email or web address] Password [facility] 02/10/2018, 6:47 PM    LOS: 6 days

## 2018-02-11 LAB — BASIC METABOLIC PANEL
ANION GAP: 5 (ref 5–15)
BUN: 27 mg/dL — ABNORMAL HIGH (ref 6–20)
CHLORIDE: 108 mmol/L (ref 98–111)
CO2: 26 mmol/L (ref 22–32)
Calcium: 7.7 mg/dL — ABNORMAL LOW (ref 8.9–10.3)
Creatinine, Ser: 1.18 mg/dL — ABNORMAL HIGH (ref 0.44–1.00)
GFR calc Af Amer: >60 mL/min (ref >60)
GFR calc non Af Amer: 52 mL/min — ABNORMAL LOW (ref >60)
GLUCOSE: 144 mg/dL — AB (ref 70–99)
Potassium: 4.2 mmol/L (ref 3.5–5.1)
Sodium: 139 mmol/L (ref 135–145)

## 2018-02-11 LAB — CBC WITH DIFFERENTIAL/PLATELET
Abs Immature Granulocytes: 0.39 10*3/uL — ABNORMAL HIGH (ref 0.00–0.07)
Basophils Absolute: 0 10*3/uL (ref 0.0–0.1)
Basophils Relative: 0 %
EOS ABS: 0 10*3/uL (ref 0.0–0.5)
EOS PCT: 0 %
HEMATOCRIT: 30.2 % — AB (ref 36.0–46.0)
HEMOGLOBIN: 9.3 g/dL — AB (ref 12.0–15.0)
IMMATURE GRANULOCYTES: 5 %
Lymphocytes Relative: 5 %
Lymphs Abs: 0.4 10*3/uL — ABNORMAL LOW (ref 0.7–4.0)
MCH: 28 pg (ref 26.0–34.0)
MCHC: 30.8 g/dL (ref 30.0–36.0)
MCV: 91 fL (ref 80.0–100.0)
MONOS PCT: 2 %
Monocytes Absolute: 0.2 10*3/uL (ref 0.1–1.0)
Neutro Abs: 6.4 10*3/uL (ref 1.7–7.7)
Neutrophils Relative %: 88 %
Platelets: 145 10*3/uL — ABNORMAL LOW (ref 150–400)
RBC: 3.32 MIL/uL — ABNORMAL LOW (ref 3.87–5.11)
RDW: 16.4 % — AB (ref 11.5–15.5)
WBC: 7.4 10*3/uL (ref 4.0–10.5)
nRBC: 0 % (ref 0.0–0.2)

## 2018-02-11 MED ORDER — SODIUM CHLORIDE 0.9 % IV SOLN
2.0000 g | Freq: Three times a day (TID) | INTRAVENOUS | Status: AC
  Administered 2018-02-11 – 2018-02-12 (×3): 2 g via INTRAVENOUS
  Filled 2018-02-11 (×4): qty 2

## 2018-02-11 MED ORDER — HEPARIN (PORCINE) 25000 UT/250ML-% IV SOLN
INTRAVENOUS | Status: AC
  Filled 2018-02-11: qty 250

## 2018-02-11 NOTE — Care Management Note (Signed)
Case Management Note  Patient Details  Name: LYSBETH DICOLA MRN: [id number] Date of Birth: 1965/07/21  If discussed at Long Length of Stay Meetings, dates discussed:  02/11/18  Additional Comments:  Sherald Barge, RN 02/11/2018, 12:06 PM

## 2018-02-11 NOTE — Evaluation (Signed)
Physical Therapy Evaluation Patient Details Name: Sarah [name] MRN: [id number] DOB: [birth year] Today's Date: 02/11/2018   History of Present Illness  Sarah [name]  is a 52 y.o. 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  Patient states that symptoms have been going on for past 3 days.  She also complains of nausea but no vomiting.  Denies any fever.  Denies chest pain.    Clinical Impression  Patient limited to long sitting in bed only, had to use bilateral bed rails to hold self up for up to 20-30 seconds before having lie supine.  Patient mostly limited due to c/o nausea, which has been ongoing since admission per RN and generalized weakness.  Patient will benefit from continued physical therapy in hospital and recommended venue below to increase strength, balance, endurance for safe ADLs and gait.    Follow Up Recommendations SNF    Equipment Recommendations  None recommended by PT    Recommendations for Other Services       Precautions / Restrictions Precautions Precautions: Fall Precaution Comments: paraplegic Restrictions Weight Bearing Restrictions: No      Mobility  Bed Mobility Overal bed mobility: Needs Assistance Bed Mobility: Supine to Sit;Sit to Supine     Supine to sit: Max assist Sit to supine: Mod assist   General bed mobility comments: limited to long sitting only using bilateral siderails  Transfers                    Ambulation/Gait                Stairs            Wheelchair Mobility    Modified Rankin (Stroke Patients Only)       Balance Overall balance assessment: Needs assistance Sitting-balance support: Feet unsupported;Bilateral upper extremity supported Sitting balance-Leahy Scale: Poor Sitting balance -  Comments: fair/poor long sitting holding onto bed rails                                     Pertinent Vitals/Pain Pain Assessment: Faces Faces Pain Scale: Hurts little more Pain Location: low back when attempting to sit up Pain Descriptors / Indicators: Aching;Sore;Discomfort Pain Intervention(s): Limited activity within patient's tolerance;Monitored during session    Home Living Family/patient expects to be discharged to:: Private residence Living Arrangements: Other relatives Available Help at Discharge: Available PRN/intermittently Type of Home: House Home Access: Ramped entrance     Home Layout: One level Home Equipment: Environmental consultant - 2 wheels;Bedside commode;Shower seat;Wheelchair - power;Wheelchair - manual;Hospital bed;Other (comment) Additional Comments: using sliding board for transfers to power wheelchair    Prior Function Level of Independence: Needs assistance   Gait / Transfers Assistance Needed: nonambulatory, uses sliding board with 1 person assist for bed<>w/c transfers  ADL's / Homemaking Assistance Needed: assisted by family        Hand Dominance   Dominant Hand: Right    Extremity/Trunk Assessment   Upper Extremity Assessment Upper Extremity Assessment: Generalized weakness    Lower Extremity Assessment Lower Extremity Assessment: Generalized weakness;RLE deficits/detail;LLE deficits/detail RLE Deficits / Details: 0/5 voluntary movement, involuntary movement noted LLE Deficits / Details: 0/5 voluntary movement, involuntary movement noted  Communication   Communication: No difficulties  Cognition Arousal/Alertness: Awake/alert Behavior During Therapy: WFL for tasks assessed/performed Overall Cognitive Status: Within Functional Limits for tasks assessed                                        General Comments      Exercises     Assessment/Plan    PT Assessment Patient needs continued PT services  PT  Problem List Decreased strength;Decreased activity tolerance;Decreased balance;Decreased mobility       PT Treatment Interventions Functional mobility training;Therapeutic activities;Therapeutic exercise;Wheelchair mobility training;Patient/family education;Balance training    PT Goals (Current goals can be found in the Care Plan section)  Acute Rehab PT Goals Patient Stated Goal: return home after rehab PT Goal Formulation: With patient Time For Goal Achievement: 02/25/18 Potential to Achieve Goals: Fair    Frequency Min 3X/week   Barriers to discharge        Co-evaluation               AM-PAC PT "6 Clicks" Daily Activity  Outcome Measure Difficulty turning over in bed (including adjusting bedclothes, sheets and blankets)?: Unable Difficulty moving from lying on back to sitting on the side of the bed? : Unable Difficulty sitting down on and standing up from a chair with arms (e.g., wheelchair, bedside commode, etc,.)?: Unable Help needed moving to and from a bed to chair (including a wheelchair)?: Total Help needed walking in hospital room?: Total Help needed climbing 3-5 steps with a railing? : Total 6 Click Score: 6    End of Session Equipment Utilized During Treatment: Oxygen Activity Tolerance: Patient limited by fatigue Patient left: in bed;with call bell/phone within reach Nurse Communication: Mobility status PT Visit Diagnosis: Other symptoms and signs involving the nervous system (R29.898);Unsteadiness on feet (R26.81);Muscle weakness (generalized) (M62.81)    Time: 9935-7017 PT Time Calculation (min) (ACUTE ONLY): 22 min   Charges:   PT Evaluation $PT Eval Moderate Complexity: 1 Mod PT Treatments $Therapeutic Activity: 23-37 mins        12:29 PM, 02/11/18 Lonell Grandchild, MPT Physical Therapist with [facility] [phone] office [phone] mobile phone

## 2018-02-11 NOTE — NC FL2 (Signed)
[place] LEVEL OF CARE SCREENING TOOL     IDENTIFICATION  Patient Name: Sarah [name] Birthdate: May 14, 1965 Sex: female Admission Date (Current Location): 02/03/2018  [facility] and [place] Number:  [employer] and Address:  [place] [address] [place]      Provider Number: [id number]  Attending Physician Name and Address:  Kathie Dike, MD  Relative Name and Phone Number:       Current Level of Care: Hospital Recommended Level of Care: [place] Prior Approval Number:    Date Approved/Denied:   PASRR Number: [id number]  Discharge Plan: SNF    Current Diagnoses: Patient Active Problem List   Diagnosis Date Noted  . Fecal impaction ([place]) 02/06/2018  . HCAP (healthcare-associated pneumonia) 02/06/2018  . Anemia   . Chronic anticoagulation   . Pressure injury of skin 02/04/2018  . Fracture   . Acute lower UTI   . Myelopathy ([place]) 09/21/2017  . Muscle spasms of both lower extremities   . Closed fracture of shaft of left femur ([place])   . Acute blood loss anemia   . Hypothyroidism   . Nausea & vomiting 09/15/2017  . Closed fracture of left distal femur ([place]) 09/11/2017  . Paraplegia ([place]) 09/08/2017  . Obesity (BMI 30-39.9) 09/03/2017  . Status post surgery 09/03/2017  . Calculus of gallbladder without cholecystitis without obstruction   . Biliary dyskinesia   . Intractable nausea and vomiting 08/09/2017  . Thrombocytopenia ([place]) 08/09/2017  . Elevated bilirubin 08/09/2017  . AKI (acute kidney injury) ([place]) 08/09/2017  . Nasal congestion 04/05/2017  . Breast pain 01/26/2017  . Upper respiratory infection, viral 01/26/2017  . Counseling regarding advanced care planning and goals of care 01/02/2017  . Bilateral rotator cuff syndrome 02/12/2016  . E. coli UTI 08/01/2015  . Constipation due to neurogenic bowel 08/01/2015  . Muscle spasticity   . 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 Spastic  paraparesis   . Neurogenic bladder   . History of pulmonary embolism   . Post-operative pain   . Metastatic cancer ([place])   . Steroid-induced hyperglycemia   . Depression   . Obstipation   . Thoracic spine tumor 07/06/2015  . 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 ([place]) 07/06/2015  . Spinal cord compression due to malignant neoplasm metastatic to spine ([place])   . Postoperative hypothyroidism   . 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 Spasticity 09/19/2014  . Dysuria 09/04/2014  . Hypertension 05/01/2014  . Ingrown right big toenail 05/01/2014  . GERD (gastroesophageal reflux disease) 02/14/2014  . Dysphagia, pharyngoesophageal phase 02/06/2014  . Type 2 diabetes mellitus without complication ([place]) 38/93/7342  . Constipation   . Neurogenic bowel   . Paraplegia at T4 level ([place]) 01/20/2014  . Metastatic cancer to spine ([place]) 01/19/2014  . Rotator cuff tendonitis   . 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 Leg weakness, bilateral 12/13/2013    Orientation RESPIRATION BLADDER Height & Weight     Self, Time, Situation, Place  O2(Nasal cannula) External catheter Weight: 110.1 kg Height:  5\' 11"  (180.3 cm)  BEHAVIORAL SYMPTOMS/MOOD NEUROLOGICAL BOWEL NUTRITION STATUS  (none) (none) Incontinent Diet(Heart healthy)  AMBULATORY STATUS COMMUNICATION OF NEEDS Skin   Total Care([place]) Verbally PU Stage and Appropriate Care PU Stage 1 Dressing: (L heel ) PU Stage 2 Dressing: (sacrum)  Personal Care Assistance Level of Assistance  Bathing, Feeding, Dressing Bathing Assistance: Maximum assistance Feeding assistance: Independent Dressing Assistance: Maximum assistance     Functional Limitations Info  Sight, Hearing, Speech Sight Info: Adequate Hearing Info: Adequate Speech Info: Adequate    SPECIAL CARE FACTORS FREQUENCY  PT (By licensed PT)     PT  Frequency: 5x/w              Contractures Contractures Info: Not present    Additional Factors Info  Code Status, Allergies Code Status Info: full Allergies Info: bee venom, keflex, tramadol, gabapentin, heparin, HCTZ, hydrocodone, oxycodone, sorbitol, tizanidine           Current Medications (02/11/2018):  This is the current hospital active medication list Current Facility-Administered Medications  Medication Dose Route Frequency Provider Last Rate Last Dose  . 0.9 %  sodium chloride infusion (Manually program via Guardrails IV Fluids)   Intravenous Once Johnson, Clanford L, MD      . 0.9 %  sodium chloride infusion (Manually program via Guardrails IV Fluids)   Intravenous Once Oswald Hillock, MD      . acetaminophen (TYLENOL) tablet 650 mg  650 mg Oral Q4H PRN Oswald Hillock, MD   650 mg at 02/05/18 1641  . acetylcysteine (MUCOMYST) 20 % nebulizer / oral solution 4 mL  4 mL Nebulization BID Kathie Dike, MD   4 mL at 02/11/18 0901  . albuterol (PROVENTIL) (2.5 MG/3ML) 0.083% nebulizer solution 2.5 mg  2.5 mg Nebulization Q6H PRN Lovey Newcomer T, NP   2.5 mg at 02/11/18 0219  . baclofen (LIORESAL) tablet 25 mg  25 mg Oral QID PRN Oswald Hillock, MD   25 mg at 02/10/18 1023  . ceFEPIme (MAXIPIME) 2 g in sodium chloride 0.9 % 100 mL IVPB  2 g Intravenous Q8H Memon, Jolaine Artist, MD      . Chlorhexidine Gluconate Cloth 2 % PADS 6 each  6 each Topical Daily Johnson, Clanford L, MD   6 each at 02/11/18 1013  . famotidine (PEPCID) tablet 20 mg  20 mg Oral BID Oswald Hillock, MD   20 mg at 02/11/18 0831  . feeding supplement (ENSURE ENLIVE) (ENSURE ENLIVE) liquid 237 mL  237 mL Oral TID BM Kathie Dike, MD   237 mL at 02/11/18 1550  . feeding supplement (PRO-STAT SUGAR FREE 64) liquid 30 mL  30 mL Oral TID BM Kathie Dike, MD   30 mL at 02/11/18 1550  . guaiFENesin-dextromethorphan (ROBITUSSIN DM) 100-10 MG/5ML syrup 10 mL  10 mL Oral Q4H PRN Lovey Newcomer T, NP   10 mL at 02/06/18 2115   . hydrOXYzine (ATARAX/VISTARIL) tablet 25 mg  25 mg Oral TID PRN Kathie Dike, MD   25 mg at 02/11/18 0212  . ipratropium-albuterol (DUONEB) 0.5-2.5 (3) MG/3ML nebulizer solution 3 mL  3 mL Nebulization TID Johnson, Clanford L, MD   3 mL at 02/11/18 1405  . levothyroxine (SYNTHROID, LEVOTHROID) tablet 150 mcg  150 mcg Oral Q0600 Oswald Hillock, MD   150 mcg at 02/11/18 0654  . loratadine (CLARITIN) tablet 10 mg  10 mg Oral Daily Oswald Hillock, MD   10 mg at 02/11/18 0831  . MEDLINE mouth rinse  15 mL Mouth Rinse BID Johnson, Clanford L, MD   15 mL at 02/11/18 0832  . methylPREDNISolone sodium succinate (SOLU-MEDROL) 125 mg/2 mL injection 60 mg  60 mg Intravenous Q12H Kathie Dike, MD   60 mg at 02/11/18 0830  .  multivitamin with minerals tablet 1 tablet  1 tablet Oral Daily Kathie Dike, MD   1 tablet at 02/11/18 0831  . ondansetron (ZOFRAN) tablet 4 mg  4 mg Oral Q6H PRN Oswald Hillock, MD       Or  . ondansetron [facility]) injection 4 mg  4 mg Intravenous Q6H PRN Oswald Hillock, MD   4 mg at 02/11/18 0831  . oxyCODONE (Oxy IR/ROXICODONE) immediate release tablet 5 mg  5 mg Oral Q6H PRN Oswald Hillock, MD   5 mg at 02/11/18 0329  . pantoprazole (PROTONIX) EC tablet 40 mg  40 mg Oral Daily Oswald Hillock, MD   40 mg at 02/11/18 0831  . polyethylene glycol (MIRALAX / GLYCOLAX) packet 17 g  17 g Oral Daily Oswald Hillock, MD   17 g at 02/11/18 0830  . promethazine (PHENERGAN) injection 12.5 mg  12.5 mg Intravenous Q6H PRN Johnson, Clanford L, MD   12.5 mg at 02/11/18 0328  . rivaroxaban (XARELTO) tablet 20 mg  20 mg Oral Q supper Oswald Hillock, MD   20 mg at 02/10/18 1812  . senna (SENOKOT) tablet 8.6 mg  1 tablet Oral BID Oswald Hillock, MD   8.6 mg at 02/11/18 0831  . sertraline (ZOLOFT) tablet 50 mg  50 mg Oral Daily Oswald Hillock, MD   50 mg at 02/11/18 2671  . sodium chloride flush (NS) 0.9 % injection 10-40 mL  10-40 mL Intracatheter PRN Wynetta Emery, Clanford L, MD   10 mL at 02/08/18 2156  .  sodium phosphate (FLEET) 7-19 GM/118ML enema 1 enema  1 enema Rectal Daily Johnson, Clanford L, MD   1 enema at 02/11/18 1012  . tiZANidine (ZANAFLEX) tablet 2 mg  2 mg Oral Q8H PRN Wynetta Emery, Clanford L, MD   2 mg at 02/06/18 2115     Discharge Medications: Please see discharge summary for a list of discharge medications.  Relevant Imaging Results:  Relevant Lab Results:   Additional [place], LCSW

## 2018-02-11 NOTE — Plan of Care (Signed)
  Problem: Acute Rehab PT Goals(only PT should resolve) Goal: Pt Will Go Supine/Side To Sit Outcome: Progressing Flowsheets (Taken 02/11/2018 1230) Pt will go Supine/Side to Sit: with moderate assist Goal: Patient Will Perform Sitting Balance Outcome: Progressing Flowsheets (Taken 02/11/2018 1230) Patient will perform sitting balance: with minimal assist Goal: Pt Will Transfer Bed To Chair/Chair To Bed Outcome: Progressing Flowsheets (Taken 02/11/2018 1230) Pt will Transfer Bed to Chair/Chair to Bed: with mod assist Note:  Using sliding board   12:31 PM, 02/11/18 Lonell Grandchild, MPT Physical Therapist with [facility] [phone] office [phone] mobile phone

## 2018-02-11 NOTE — Progress Notes (Signed)
PROGRESS NOTE    Sarah [name]  PYK:[id number]  DOB: 01-19-1966  DOA: 02/03/2018 PCP: [name], MD   Brief Admission Hx: 52 y.o. 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  Patient states that symptoms have been going on for past 3 days.  She also complains of nausea but no vomiting.  Denies any fever.  Denies chest pain.  Pt was admitted with a multifocal pneumonia.    MDM/Assessment & Plan:    1. Healthcare associated pneumonia-chest x-ray shows masslike opacity, likely infectious as it developed quickly since December 13, 2017.  CT confirms a multifocal pneumonia.  Pt was initially started on ceftriaxone and Zithromax but given the severity of infection and recent postop status, coverage was broadened to cefepime/vancomycin.  MRSA (+).  Patient will need repeat chest x-ray in 2 to 3 weeks to check further resolution of pneumonia.  Blood cultures NGTD.  Strep pneumo and legionella antigens negative.  Progress will be slow since she has a very weak cough reflex and poor inspiratory effort.  Continue mucomyst nebs. Continue steroids.  Add chest physiotherapy with percussion.  2. History of pulmonary embolism-continue Xarelto for full anticoagulation.   3. 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 Pt has been treated with radiation and chemotherapy.  Follow up with oncology when discharged.    4. Hypothyroidism-continue Synthroid 5. Hypotension - soft blood pressures likely multifactorial from dehydration, infection, anemia, paraplegia.  Pt is asymptomatic and MAP>65.     6. Anemia - unspecified.  Transfused 2 units PRBC 11/15 with improvement of hemoglobin.  Appreciate GI consult and assistance.  No gross GI bleeding. CT abd/pelvis  negative for retroperitoneal bleed or hematoma.  Hg up to 9 after transfusion.  Following.    7. Hypokalemia -replaced.  Magnesium is normal. 8. Stage 2 sacral pressure injury and stage 1 left heel injury - wound care protocol.  9. Fecal impaction - seen on CT abdomen/pelvis -feels better after receiving enemas 10. Anasarca.  Related to hypoalbuminemia.  Started on low-dose Lasix.  Patient has had good urine output. Lasix has been held due to rising creatinine  DVT Prophylaxis-   Xarelto  AM Labs Ordered, also please review Full Orders  Family Communication:  Discussed with family at the bedside  Code Status:  Full code, palliative care consulted to help address further goals of care.  Consultants:  GI  Palliative care  Antimicrobials:  Ceftriaxone 11/14 >11/16  Azithromycin 11/14>11/16  Vancomycin 11/16 >11/19  Cefepime 11/16 >  Pressure Injury Documentation: Pressure Injury 02/04/18 Stage II -  Partial thickness loss of dermis presenting as a shallow open ulcer with a red, pink wound bed without slough. (Active)  02/04/18 0130   Location: Sacrum  Location Orientation: Mid  Staging: Stage II -  Partial thickness loss of dermis presenting as a shallow open ulcer with a red, pink wound bed without slough.  Wound Description (Comments):   Present on Admission: Yes     Pressure Injury 02/04/18 Stage I -  Intact skin with non-blanchable redness of a localized area usually over a bony prominence. (Active)  02/04/18 0120   Location: Heel  Location Orientation: Left  Staging: Stage I -  Intact skin with non-blanchable redness of a localized area usually over a bony prominence.  Wound Description (Comments):  Present on Admission: Yes    Subjective: Still feels short of breath.  Having difficulty expectorating sputum.  Objective: Vitals:   02/11/18 1100 02/11/18 1200 02/11/18 1405 02/11/18 1559  BP:    117/67  Pulse: 94 95  76  Resp: (!) 23 (!) 26  16  Temp:     98.3 F (36.8 C)  TempSrc:    Oral  SpO2: 95% 95% 92% 97%  Weight:      Height:        Intake/Output Summary (Last 24 hours) at 02/11/2018 1644 Last data filed at 02/11/2018 1200 Gross per 24 hour  Intake 397.43 ml  Output 1250 ml  Net -852.57 ml   Filed Weights   02/08/18 0500 02/10/18 0427 02/11/18 0500  Weight: 111.8 kg 111 kg 110.1 kg     REVIEW OF SYSTEMS  As per history otherwise all reviewed and reported negative  Exam:  General exam: Alert, awake, oriented x 3 Respiratory system: Bilateral rhonchi. Respiratory effort normal. Cardiovascular system:RRR. No murmurs, rubs, gallops. Gastrointestinal system: Abdomen is nondistended, soft and nontender. No organomegaly or masses felt. Normal bowel sounds heard. Extremities: 1-2+ edema bilaterally Skin: No rashes, lesions or ulcers Psychiatry: Judgement and insight appear normal. Mood & affect appropriate.        Data Reviewed: Basic Metabolic Panel: Recent Labs  Lab 02/05/18 0729  02/07/18 0410 02/08/18 0425 02/09/18 0603 02/10/18 0408 02/11/18 0359  NA  --    < > 140 138 138 137 139  K  --    < > 4.0 4.1 3.5 4.3 4.2  CL  --    < > 113* 109 107 105 108  CO2  --    < > 23 24 26 26 26   GLUCOSE  --    < > 112* 80 105* 141* 144*  BUN  --    < > 11 10 14 19  27*  CREATININE  --    < > 0.76 0.67 1.04* 1.15* 1.18*  CALCIUM  --    < > 7.3* 7.4* 7.5* 7.8* 7.7*  MG 1.7  --  1.9  --   --   --   --    < > = values in this interval not displayed.   Liver Function Tests: Recent Labs  Lab 02/06/18 0719 02/07/18 0410 02/08/18 0425 02/09/18 0603 02/10/18 0408  AST 15 13* 12* 15 15  ALT 9 9 8 7 8   ALKPHOS 108 111 117 117 120  BILITOT 0.6 0.9 1.0 0.8 0.7  PROT 4.6* 4.8* 4.8* 5.0* 5.2*  ALBUMIN 1.8* 1.8* 1.7* 1.7* 1.6*   No results for input(s): LIPASE, AMYLASE in the last 168 hours. No results for input(s): AMMONIA in the last 168 hours. CBC: Recent Labs  Lab 02/07/18 0410 02/08/18 0425 02/09/18 0603  02/10/18 0408 02/11/18 0359  WBC 6.9 7.8 8.2 7.9 7.4  NEUTROABS 5.7 6.5 6.9 6.9 6.4  HGB 9.4* 9.9* 9.7* 9.5* 9.3*  HCT 31.4* 32.1* 31.5* 30.8* 30.2*  MCV 92.1 91.2 90.8 91.7 91.0  PLT 148* 126* 137* 124* 145*   Cardiac Enzymes: No results for input(s): CKTOTAL, CKMB, CKMBINDEX, TROPONINI in the last 168 hours. CBG (last 3)  No results for input(s): GLUCAP in the last 72 hours. Recent Results (from the past 240 hour(s))  Blood culture (routine x 2)     Status: None   Collection Time: 02/03/18 10:34 PM  Result Value Ref Range Status   Specimen Description BLOOD LEFT HAND  Final  Special Requests   Final    BOTTLES DRAWN AEROBIC AND ANAEROBIC Blood Culture adequate volume   Culture   Final    NO GROWTH 5 DAYS Performed at [facility], [address] [place], [place] [address]    Report Status 02/08/2018 FINAL  Final  Blood culture (routine x 2)     Status: None   Collection Time: 02/03/18 10:46 PM  Result Value Ref Range Status   Specimen Description RIGHT ANTECUBITAL  Final   Special Requests   Final    BOTTLES DRAWN AEROBIC AND ANAEROBIC Blood Culture adequate volume   Culture   Final    NO GROWTH 5 DAYS Performed at [facility], [address] [place], [place] [address]    Report Status 02/08/2018 FINAL  Final  MRSA PCR Screening     Status: Abnormal   Collection Time: 02/04/18  1:30 AM  Result Value Ref Range Status   MRSA by PCR POSITIVE (A) NEGATIVE Final    Comment:        The GeneXpert MRSA Assay (FDA approved for NASAL specimens only), is one component of a comprehensive MRSA colonization surveillance program. It is not intended to diagnose MRSA infection nor to guide or monitor treatment for MRSA infections. RESULT CALLED TO, READ BACK BY AND VERIFIED WITH: Carlye Grippe R. AT 0935A ON [id number] BY THOMPSON S. Performed at [facility], [address], [place], [place] [address]      Studies: No results found. Scheduled Meds: . sodium chloride    Intravenous Once  . sodium chloride   Intravenous Once  . acetylcysteine  4 mL Nebulization BID  . Chlorhexidine Gluconate Cloth  6 each Topical Daily  . famotidine  20 mg Oral BID  . feeding supplement (ENSURE ENLIVE)  237 mL Oral TID BM  . feeding supplement (PRO-STAT SUGAR FREE 64)  30 mL Oral TID BM  . ipratropium-albuterol  3 mL Nebulization TID  . levothyroxine  150 mcg Oral Q0600  . loratadine  10 mg Oral Daily  . mouth rinse  15 mL Mouth Rinse BID  . methylPREDNISolone (SOLU-MEDROL) injection  60 mg Intravenous Q12H  . multivitamin with minerals  1 tablet Oral Daily  . pantoprazole  40 mg Oral Daily  . polyethylene glycol  17 g Oral Daily  . rivaroxaban  20 mg Oral Q supper  . senna  1 tablet Oral BID  . sertraline  50 mg Oral Daily  . sodium phosphate  1 enema Rectal Daily   Continuous Infusions: . ceFEPime (MAXIPIME) IV      Active Problems:   Hurthle cell neoplasm of thyroid   Paraplegia at T4 level (HCC)   Constipation   Constipation due to neurogenic bowel   Closed fracture of left distal femur (HCC)   Pressure injury of skin   Anemia   Chronic anticoagulation   Fecal impaction ([place])   HCAP (healthcare-associated pneumonia)  Time spent: 56mins  Kathie Dike, MD [facility] Pager [phone]  If 7PM-7AM, please contact night-coverage [email or web address] Password [email or web address] 02/11/2018, 4:44 PM    LOS: 7 days

## 2018-02-11 NOTE — Progress Notes (Signed)
Palliative Medicine Team  Due to high volume of referrals, there is a delay seeing this patient. PMT not at [facility] over the weekend but will arrange goals of care with patient and family on Monday. Thank you for the opportunity to participate in the care of [name].   Ihor Dow, DNP, FNP-C Palliative Medicine Team  Phone: [phone] Fax: [phone]

## 2018-02-12 ENCOUNTER — Inpatient Hospital Stay (HOSPITAL_COMMUNITY): Payer: Medicare Other

## 2018-02-12 DIAGNOSIS — I361 Nonrheumatic tricuspid (valve) insufficiency: Secondary | ICD-10-CM

## 2018-02-12 DIAGNOSIS — K5909 Other constipation: Secondary | ICD-10-CM

## 2018-02-12 DIAGNOSIS — D34 Benign neoplasm of thyroid gland: Secondary | ICD-10-CM

## 2018-02-12 DIAGNOSIS — I370 Nonrheumatic pulmonary valve stenosis: Secondary | ICD-10-CM

## 2018-02-12 DIAGNOSIS — L8942 Pressure ulcer of contiguous site of back, buttock and hip, stage 2: Secondary | ICD-10-CM

## 2018-02-12 LAB — CBC
HEMATOCRIT: 32 % — AB (ref 36.0–46.0)
Hemoglobin: 9.7 g/dL — ABNORMAL LOW (ref 12.0–15.0)
MCH: 28.1 pg (ref 26.0–34.0)
MCHC: 30.3 g/dL (ref 30.0–36.0)
MCV: 92.8 fL (ref 80.0–100.0)
NRBC: 0 % (ref 0.0–0.2)
PLATELETS: 159 10*3/uL (ref 150–400)
RBC: 3.45 MIL/uL — AB (ref 3.87–5.11)
RDW: 16.1 % — ABNORMAL HIGH (ref 11.5–15.5)
WBC: 8.2 10*3/uL (ref 4.0–10.5)

## 2018-02-12 LAB — COMPREHENSIVE METABOLIC PANEL
ALBUMIN: 1.7 g/dL — AB (ref 3.5–5.0)
ALT: 12 U/L (ref 0–44)
AST: 27 U/L (ref 15–41)
Alkaline Phosphatase: 106 U/L (ref 38–126)
Anion gap: 6 (ref 5–15)
BILIRUBIN TOTAL: 0.5 mg/dL (ref 0.3–1.2)
BUN: 30 mg/dL — AB (ref 6–20)
CHLORIDE: 106 mmol/L (ref 98–111)
CO2: 27 mmol/L (ref 22–32)
CREATININE: 1.17 mg/dL — AB (ref 0.44–1.00)
Calcium: 7.8 mg/dL — ABNORMAL LOW (ref 8.9–10.3)
GFR calc Af Amer: >60 mL/min (ref >60)
GFR calc non Af Amer: 53 mL/min — ABNORMAL LOW (ref >60)
GLUCOSE: 127 mg/dL — AB (ref 70–99)
POTASSIUM: 4.1 mmol/L (ref 3.5–5.1)
Sodium: 139 mmol/L (ref 135–145)
Total Protein: 5.4 g/dL — ABNORMAL LOW (ref 6.5–8.1)

## 2018-02-12 LAB — ECHOCARDIOGRAM COMPLETE
HEIGHTINCHES: 71 in
Weight: 3883.62 oz

## 2018-02-12 LAB — MAGNESIUM: MAGNESIUM: 2.4 mg/dL (ref 1.7–2.4)

## 2018-02-12 LAB — PHOSPHORUS: Phosphorus: 3.3 mg/dL (ref 2.5–4.6)

## 2018-02-12 MED ORDER — FUROSEMIDE 10 MG/ML IJ SOLN
40.0000 mg | Freq: Every day | INTRAMUSCULAR | Status: DC
  Administered 2018-02-12 – 2018-02-15 (×4): 40 mg via INTRAVENOUS
  Filled 2018-02-12 (×4): qty 4

## 2018-02-12 MED ORDER — MAGNESIUM SULFATE 2 GM/50ML IV SOLN
2.0000 g | Freq: Once | INTRAVENOUS | Status: AC
  Administered 2018-02-12: 2 g via INTRAVENOUS
  Filled 2018-02-12: qty 50

## 2018-02-12 MED ORDER — VANCOMYCIN HCL IN DEXTROSE 750-5 MG/150ML-% IV SOLN
750.0000 mg | Freq: Two times a day (BID) | INTRAVENOUS | Status: DC
  Administered 2018-02-12 – 2018-02-13 (×3): 750 mg via INTRAVENOUS
  Filled 2018-02-12 (×6): qty 150

## 2018-02-12 MED ORDER — PERFLUTREN LIPID MICROSPHERE
1.0000 mL | INTRAVENOUS | Status: AC | PRN
  Administered 2018-02-12: 2 mL via INTRAVENOUS
  Filled 2018-02-12: qty 10

## 2018-02-12 MED ORDER — SODIUM CHLORIDE 0.9 % IV SOLN
2.0000 g | Freq: Three times a day (TID) | INTRAVENOUS | Status: AC
  Administered 2018-02-12 – 2018-02-16 (×12): 2 g via INTRAVENOUS
  Filled 2018-02-12 (×12): qty 2

## 2018-02-12 MED ORDER — DEXTROMETHORPHAN POLISTIREX ER 30 MG/5ML PO SUER: 30.0000 mg | Freq: Two times a day (BID) | ORAL | Status: DC | PRN

## 2018-02-12 MED ORDER — HYDROXYZINE HCL 25 MG PO TABS
50.0000 mg | ORAL_TABLET | Freq: Four times a day (QID) | ORAL | Status: DC | PRN
  Administered 2018-02-13: 50 mg via ORAL
  Filled 2018-02-12: qty 2

## 2018-02-12 MED ORDER — HYDROXYZINE HCL 25 MG PO TABS
50.0000 mg | ORAL_TABLET | Freq: Once | ORAL | Status: AC
  Administered 2018-02-12: 50 mg via ORAL
  Filled 2018-02-12: qty 2

## 2018-02-12 MED ORDER — VANCOMYCIN HCL 10 G IV SOLR: 2000.0000 mg | Freq: Once | INTRAVENOUS | Status: DC

## 2018-02-12 MED ORDER — VANCOMYCIN HCL 10 G IV SOLR
1500.0000 mg | Freq: Once | INTRAVENOUS | Status: AC
  Administered 2018-02-12: 1500 mg via INTRAVENOUS
  Filled 2018-02-12: qty 1500

## 2018-02-12 MED ORDER — POTASSIUM CHLORIDE CRYS ER 20 MEQ PO TBCR
40.0000 meq | EXTENDED_RELEASE_TABLET | Freq: Every day | ORAL | Status: DC
  Administered 2018-02-12 – 2018-02-15 (×4): 40 meq via ORAL
  Filled 2018-02-12 (×4): qty 2

## 2018-02-12 MED ORDER — METHYLPREDNISOLONE SODIUM SUCC 40 MG IJ SOLR
40.0000 mg | Freq: Two times a day (BID) | INTRAMUSCULAR | Status: DC
  Administered 2018-02-12 – 2018-02-14 (×6): 40 mg via INTRAVENOUS
  Filled 2018-02-12 (×6): qty 1

## 2018-02-12 MED ORDER — GUAIFENESIN ER 600 MG PO TB12
1200.0000 mg | ORAL_TABLET | Freq: Two times a day (BID) | ORAL | Status: DC
  Administered 2018-02-12 – 2018-02-17 (×10): 1200 mg via ORAL
  Filled 2018-02-12 (×11): qty 2

## 2018-02-12 NOTE — Progress Notes (Signed)
Pharmacy Antibiotic Note  Sarah [name] is a 52 y.o. female admitted on 02/03/2018  With healthcare associate pneumonia.  Pharmacy has been consulted for Vancomycin and cefepime dosing.   Plan: Vancomycin 1500 mg IV x 1 dose. Start Vancomycin 750 mg IV every 12 hours. Continue cefepime 2gm IV q8h for total of 7 days F/u cxs and clinical progress   Height: 5\' 11"  (180.3 cm) Weight: 242 lb 11.6 oz (110.1 kg) IBW/kg (Calculated) : 70.8  Temp (24hrs), Avg:97.9 F (36.6 C), Min:97.6 F (36.4 C), Max:98.3 F (36.8 C)  Recent Labs  Lab 02/08/18 0425 02/09/18 0603 02/10/18 0408 02/11/18 0359 02/12/18 0427  WBC 7.8 8.2 7.9 7.4 8.2  CREATININE 0.67 1.04* 1.15* 1.18* 1.17*  VANCOTROUGH  --  50*  --   --   --     Estimated Creatinine Clearance: 77.7 mL/min (A) (by C-G formula based on SCr of 1.17 mg/dL (H)).    Allergies  Allergen Reactions  . Bee Venom Anaphylaxis  . Keflex [Cephalexin] Nausea And Vomiting  . Tramadol Nausea And Vomiting  . Gabapentin Nausea And Vomiting  . Heparin Other (See Comments)    Weak positive platelets induced antibodies, SRA negative--2015  . Hydrochlorothiazide Nausea And Vomiting  . Hydrocodone Nausea And Vomiting  . Oxycodone-Acetaminophen Nausea And Vomiting  . Sorbitol Nausea Only    And abdominal distress  . Tizanidine     Fatigue    Anti-infectives (From admission, onward)   Start     Dose/Rate Route Frequency Ordered Stop   02/13/18 0000  vancomycin (VANCOCIN) IVPB 750 mg/150 ml premix     750 mg 150 mL/hr over 60 Minutes Intravenous Every 12 hours 02/12/18 0910     02/12/18 1000  vancomycin (VANCOCIN) 2,000 mg in sodium chloride 0.9 % 500 mL IVPB  Status:  Discontinued     2,000 mg 250 mL/hr over 120 Minutes Intravenous  Once 02/12/18 0853 02/12/18 0854   02/12/18 1000  vancomycin (VANCOCIN) 1,500 mg in sodium chloride 0.9 % 500 mL IVPB     1,500 mg 250 mL/hr over 120 Minutes Intravenous  Once 02/12/18 0854     02/11/18 1145   ceFEPIme (MAXIPIME) 2 g in sodium chloride 0.9 % 100 mL IVPB     2 g 200 mL/hr over 30 Minutes Intravenous Every 8 hours 02/11/18 1146 02/12/18 1959   02/09/18 1200  ceFEPIme (MAXIPIME) 2 g in sodium chloride 0.9 % 100 mL IVPB  Status:  Discontinued     2 g 200 mL/hr over 30 Minutes Intravenous Every 8 hours 02/09/18 1031 02/11/18 1146   02/06/18 1900  vancomycin (VANCOCIN) 1,500 mg in sodium chloride 0.9 % 500 mL IVPB  Status:  Discontinued     1,500 mg 250 mL/hr over 120 Minutes Intravenous Every 12 hours 02/06/18 0827 02/09/18 1019   02/06/18 0830  vancomycin (VANCOCIN) 2,000 mg in sodium chloride 0.9 % 500 mL IVPB     2,000 mg 250 mL/hr over 120 Minutes Intravenous  Once 02/06/18 0826 02/06/18 1237   02/06/18 0830  ceFEPIme (MAXIPIME) 2 g in sodium chloride 0.9 % 100 mL IVPB  Status:  Discontinued     2 g 200 mL/hr over 30 Minutes Intravenous Every 8 hours 02/06/18 0826 02/09/18 1031   02/05/18 1500  cefTRIAXone (ROCEPHIN) 1 g in sodium chloride 0.9 % 100 mL IVPB  Status:  Discontinued     1 g 200 mL/hr over 30 Minutes Intravenous Every 24 hours 02/04/18 0412 02/04/18 1421  02/04/18 2200  cefTRIAXone (ROCEPHIN) 1 g in sodium chloride 0.9 % 100 mL IVPB  Status:  Discontinued     1 g 200 mL/hr over 30 Minutes Intravenous Every 24 hours 02/04/18 1421 02/06/18 0642   02/04/18 1300  azithromycin (ZITHROMAX) 500 mg in sodium chloride 0.9 % 250 mL IVPB  Status:  Discontinued     500 mg 250 mL/hr over 60 Minutes Intravenous Every 24 hours 02/04/18 0412 02/06/18 0642   02/03/18 2215  cefTRIAXone (ROCEPHIN) 1 g in sodium chloride 0.9 % 100 mL IVPB     1 g 200 mL/hr over 30 Minutes Intravenous  Once 02/03/18 2214 02/03/18 2322   02/03/18 2215  azithromycin (ZITHROMAX) 500 mg in sodium chloride 0.9 % 250 mL IVPB     500 mg 250 mL/hr over 60 Minutes Intravenous  Once 02/03/18 2214 02/04/18 0024      Antimicrobials this admission: 11/13 azithromycin >> 11/14 11/13 rocephin >> 11/15 11/16  cefepime >> 11/16 vancomycin >> 11/19 Vanco 11/22 >>   Microbiology results: 11/13 BCx: ng 11/14 MRSA PCR: positive  Thank you for allowing pharmacy to be a part of this patient's care.  Ramond Craver 02/12/2018 9:11 AM

## 2018-02-12 NOTE — Care Management Important Message (Signed)
Important Message  Patient Details  Name: Sarah [name] MRN: [id number] Date of Birth: [birth year]   Medicare Important Message Given:  Yes    Sherald Barge, RN 02/12/2018, 12:17 PM

## 2018-02-12 NOTE — Progress Notes (Addendum)
PROGRESS NOTE  Sarah ARSCOTT  WEX:[id number]  DOB: 08/31/1965  DOA: 02/03/2018 PCP: [name], MD   Brief Admission Hx: 52 y.o. 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  Patient states that symptoms have been going on for past 3 days.  She also complains of nausea but no vomiting.  Denies any fever.  Denies chest pain.  Pt was admitted with a multifocal pneumonia.    MDM/Assessment & Plan:    1. Healthcare associated pneumonia-chest x-ray shows masslike opacity, likely infectious as it developed quickly since December 13, 2017.  CT confirms a multifocal pneumonia.  Pt was initially started on ceftriaxone and Zithromax but given the severity of infection and recent postop status, coverage was broadened to cefepime/vancomycin.  MRSA (+).  Patient will need repeat chest x-ray in 2 to 3 weeks to check further resolution of pneumonia.  Blood cultures NGTD.  Strep pneumo and legionella antigens negative.  Progress will be slow since she has a very weak cough reflex and poor inspiratory effort.  Continue mucomyst nebs. Continue steroids.  Added chest physiotherapy with percussion.  CXR 11/22 shows progression, will add back vancomycin, continue cefepime.  IV lasix ordered. Echo ordered.  2. History of pulmonary embolism-continue Xarelto for full anticoagulation.  3. 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 Pt has been treated with radiation and chemotherapy.  Follow up with oncology when discharged.   4. Hypothyroidism-continue Synthroid 5. Hypotension - improving, soft blood pressures likely multifactorial from dehydration, infection, anemia, paraplegia.  Pt is asymptomatic and MAP>65.    6. Anemia - unspecified.  Transfused 2 units PRBC 11/15  with improvement of hemoglobin.  Appreciate GI consult and assistance.  No gross GI bleeding. CT abd/pelvis negative for retroperitoneal bleed or hematoma.  Hg up to 9 after transfusion.  Following.    7. Hypokalemia -replaced.  Magnesium is normal. 8. Stage 2 sacral pressure injury and stage 1 left heel injury - improving, continue wound care protocol. Consult WOC nurse.  9. Fecal impaction - Resolved.  Was seen on CT abdomen/pelvis -feels better after receiving enemas.  Continue daily laxatives.  10. Anasarca.  Related to hypoalbuminemia.  Started on low-dose Lasix.  Patient has had good urine output.  Dietitian consulted to assist with protein supplementation.  11. Possible CHF - pulmonary edema seen on CXR 11/22.  IV lasix ordered.  Echocardiogram ordered.   DVT Prophylaxis-   Xarelto  Family Communication:  Discussed with family at the bedside  Code Status:  Full code, palliative care consulted to help address further goals of care.  Consultants:  GI  Palliative care (unable to see patient until 11/25)  Antimicrobials:  Ceftriaxone 11/14 >11/16  Azithromycin 11/14>11/16  Vancomycin 11/16 >11/19  Cefepime 11/16 >  Pressure Injury Documentation: Pressure Injury 02/04/18 Stage II -  Partial thickness loss of dermis presenting as a shallow open ulcer with a red, pink wound bed without slough. (Active)  02/04/18 0130   Location: Sacrum  Location Orientation: Mid  Staging: Stage II -  Partial thickness loss of dermis presenting as a shallow open ulcer with a red, pink wound bed without slough.  Wound Description (Comments):   Present on Admission: Yes     Pressure Injury 02/04/18 Stage I -  Intact skin with non-blanchable redness of a localized  area usually over a bony prominence. (Active)  02/04/18 0120   Location: Heel  Location Orientation: Left  Staging: Stage I -  Intact skin with non-blanchable redness of a localized area usually over a bony prominence.  Wound  Description (Comments):   Present on Admission: Yes   Subjective: Pt remains SOB.  Pt reports that she feels very congested but not able to expectorate. No chest pain.   Objective: Vitals:   02/11/18 2157 02/12/18 0532 02/12/18 0638 02/12/18 0811  BP: (!) 138/92  132/81   Pulse: 83  74   Resp:      Temp: 97.8 F (36.6 C)  97.6 F (36.4 C)   TempSrc: Oral  Oral   SpO2: 95% 97% 97% 97%  Weight:      Height:        Intake/Output Summary (Last 24 hours) at 02/12/2018 2725 Last data filed at 02/12/2018 0300 Gross per 24 hour  Intake 201.1 ml  Output -  Net 201.1 ml   Filed Weights   02/08/18 0500 02/10/18 0427 02/11/18 0500  Weight: 111.8 kg 111 kg 110.1 kg    REVIEW OF SYSTEMS  As per history otherwise all reviewed and reported negative  Exam:  General exam: Alert, awake, oriented x 3 Respiratory system: coarse anterior congestion, rales. Respiratory effort normal. Cardiovascular system:normal s1,s2 sounds. No murmurs, rubs, gallops. Gastrointestinal system: Abdomen is nondistended, soft and nontender. No organomegaly or masses felt. Normal bowel sounds heard. Extremities: 1+ edema bilateral LEs.  Skin: No rashes, lesions or ulcers Psychiatry: Judgement and insight appear normal. Mood & affect appropriate.   Data Reviewed: Basic Metabolic Panel: Recent Labs  Lab 02/07/18 0410 02/08/18 0425 02/09/18 0603 02/10/18 0408 02/11/18 0359 02/12/18 0427  NA 140 138 138 137 139 139  K 4.0 4.1 3.5 4.3 4.2 4.1  CL 113* 109 107 105 108 106  CO2 23 24 26 26 26 27   GLUCOSE 112* 80 105* 141* 144* 127*  BUN 11 10 14 19  27* 30*  CREATININE 0.76 0.67 1.04* 1.15* 1.18* 1.17*  CALCIUM 7.3* 7.4* 7.5* 7.8* 7.7* 7.8*  MG 1.9  --   --   --   --  2.4  PHOS  --   --   --   --   --  3.3   Liver Function Tests: Recent Labs  Lab 02/07/18 0410 02/08/18 0425 02/09/18 0603 02/10/18 0408 02/12/18 0427  AST 13* 12* 15 15 27   ALT 9 8 7 8 12   ALKPHOS 111 117 117 120 106  BILITOT  0.9 1.0 0.8 0.7 0.5  PROT 4.8* 4.8* 5.0* 5.2* 5.4*  ALBUMIN 1.8* 1.7* 1.7* 1.6* 1.7*   No results for input(s): LIPASE, AMYLASE in the last 168 hours. No results for input(s): AMMONIA in the last 168 hours. CBC: Recent Labs  Lab 02/07/18 0410 02/08/18 0425 02/09/18 0603 02/10/18 0408 02/11/18 0359 02/12/18 0427  WBC 6.9 7.8 8.2 7.9 7.4 8.2  NEUTROABS 5.7 6.5 6.9 6.9 6.4  --   HGB 9.4* 9.9* 9.7* 9.5* 9.3* 9.7*  HCT 31.4* 32.1* 31.5* 30.8* 30.2* 32.0*  MCV 92.1 91.2 90.8 91.7 91.0 92.8  PLT 148* 126* 137* 124* 145* 159   Cardiac Enzymes: No results for input(s): CKTOTAL, CKMB, CKMBINDEX, TROPONINI in the last 168 hours. CBG (last 3)  No results for input(s): GLUCAP in the last 72 hours. Recent Results (from the past 240 hour(s))  Blood culture (routine x 2)     Status: None   Collection  Time: 02/03/18 10:34 PM  Result Value Ref Range Status   Specimen Description BLOOD LEFT HAND  Final   Special Requests   Final    BOTTLES DRAWN AEROBIC AND ANAEROBIC Blood Culture adequate volume   Culture   Final    NO GROWTH 5 DAYS Performed at [facility], [address], [place], [place] [address]    Report Status 02/08/2018 FINAL  Final  Blood culture (routine x 2)     Status: None   Collection Time: 02/03/18 10:46 PM  Result Value Ref Range Status   Specimen Description RIGHT ANTECUBITAL  Final   Special Requests   Final    BOTTLES DRAWN AEROBIC AND ANAEROBIC Blood Culture adequate volume   Culture   Final    NO GROWTH 5 DAYS Performed at [facility], [address] [place], [place] [address]    Report Status 02/08/2018 FINAL  Final  MRSA PCR Screening     Status: Abnormal   Collection Time: 02/04/18  1:30 AM  Result Value Ref Range Status   MRSA by PCR POSITIVE (A) NEGATIVE Final    Comment:        The GeneXpert MRSA Assay (FDA approved for NASAL specimens only), is one component of a comprehensive MRSA colonization surveillance program. It is not intended to diagnose  MRSA infection nor to guide or monitor treatment for MRSA infections. RESULT CALLED TO, READ BACK BY AND VERIFIED WITH: Carlye Grippe R. AT 0935A ON [id number] BY THOMPSON S. Performed at [facility], [address], [place], [place] [address]      Studies: Dg Chest [facility] 1 View  Result Date: 02/12/2018 CLINICAL DATA:  Shortness of breath.  History of thyroid carcinoma EXAM: PORTABLE CHEST 1 VIEW COMPARISON:  February 05, 2018 FINDINGS: There is extensive airspace opacity in both upper lobes, more on the right than on the left, similar to prior study. There is underlying interstitial and patchy alveolar edema elsewhere, increased from most recent prior study. There is cardiomegaly with pulmonary venous hypertension. No adenopathy. Central catheter tip is at the cavoatrial junction. Postoperative changes are noted in the upper thoracic region, stable. IMPRESSION: Increase in interstitial and alveolar opacity, consistent with either progression of widespread pneumonia or congestive heart failure. Both entities may be present concurrently. Upper lobe consolidation, more on the right than on the left, persists. Cardiac silhouette is stable. Central catheter tip at cavoatrial junction. No pneumothorax. Electronically Signed   By: Lowella Grip III M.D.   On: 02/12/2018 07:41   Scheduled Meds: . sodium chloride   Intravenous Once  . sodium chloride   Intravenous Once  . acetylcysteine  4 mL Nebulization BID  . Chlorhexidine Gluconate Cloth  6 each Topical Daily  . famotidine  20 mg Oral BID  . feeding supplement (ENSURE ENLIVE)  237 mL Oral TID BM  . feeding supplement (PRO-STAT SUGAR FREE 64)  30 mL Oral TID BM  . guaiFENesin  1,200 mg Oral BID  . ipratropium-albuterol  3 mL Nebulization TID  . levothyroxine  150 mcg Oral Q0600  . loratadine  10 mg Oral Daily  . mouth rinse  15 mL Mouth Rinse BID  . methylPREDNISolone (SOLU-MEDROL) injection  40 mg Intravenous Q12H  . multivitamin with minerals   1 tablet Oral Daily  . pantoprazole  40 mg Oral Daily  . polyethylene glycol  17 g Oral Daily  . rivaroxaban  20 mg Oral Q supper  . senna  1 tablet Oral BID  .  sertraline  50 mg Oral Daily  . sodium phosphate  1 enema Rectal Daily   Continuous Infusions: . 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 02/12/2018, 8:32 AM    LOS: 8 days

## 2018-02-12 NOTE — Clinical Social Work Placement (Signed)
   CLINICAL SOCIAL WORK PLACEMENT  NOTE  Date:  02/12/2018  Patient Details  Name: Sarah [name] MRN: [id number] Date of Birth: November 08, 1965  Clinical Social Work is seeking post-discharge placement for this patient at the [place] level of care (*CSW will initial, date and re-position this form in  chart as items are completed):  Yes   Patient/family provided with [place] Work Department's list of facilities offering this level of care within the geographic area requested by the patient (or if unable, by the patient's family).  Yes   Patient/family informed of their freedom to choose among providers that offer the needed level of care, that participate in Medicare, Medicaid or managed care program needed by the patient, have an available bed and are willing to accept the patient.  Yes   Patient/family informed of [facility]'s ownership interest in [facility] and [facility], as well as of the fact that they are under no obligation to receive care at these facilities.  PASRR submitted to EDS on       PASRR number received on       Existing PASRR number confirmed on 02/11/18     FL2 transmitted to all facilities in geographic area requested by pt/family on 02/11/18     FL2 transmitted to all facilities within larger geographic area on       Patient informed that his/her managed care company has contracts with or will negotiate with certain facilities, including the following:        Yes   Patient/family informed of bed offers received.  Patient chooses bed at [facility]     Physician recommends and patient chooses bed at      Patient to be transferred to   on  .  Patient to be transferred to facility by       Patient family notified on   of transfer.  Name of family member notified:        PHYSICIAN       Additional Comment:    _______________________________________________ Trish Mage, LCSW 02/12/2018,  3:33 PM

## 2018-02-12 NOTE — Consult Note (Signed)
[facility] Nurse wound consult note Patient being cared for in [facility].  Evaluation attempted by cart camera remote system and her primary RN, Quillian Quince.  However, the images were choppy and sound was frequently interrupted.  The consult was completed by Quillian Quince performing assessment live in the room and speaking with me via telephone. Reason for Consult: Sacrum and heels Wound type: There are no wounds to the sacrum or the heels.  The heels have intact tissue layers, they blanch, and they are not discolored.  The sacrum tissue is intact, and Quillian Quince reports there is an area that was a wound, but is now healed as there are no open areas or areas of discoloration to suggest a wound.  Plan of care:  Turn and reposition the patient, and float the heels. Thank you for the consult.  Discussed plan of care with the bedside nurse.  [place] nurse will not follow at this time.  Please re-consult the [facility] team if needed.  Val Riles, RN, MSN, CWOCN, CNS-BC, pager [phone]

## 2018-02-12 NOTE — Clinical Social Work Placement (Signed)
   CLINICAL SOCIAL WORK PLACEMENT  NOTE  Date:  02/12/2018  Patient Details  Name: Sarah [name] MRN: [id number] Date of Birth: 03/30/1965  Clinical Social Work is seeking post-discharge placement for this patient at the [place] level of care (*CSW will initial, date and re-position this form in  chart as items are completed):  Yes   Patient/family provided with [facility] Work Department's list of facilities offering this level of care within the geographic area requested by the patient (or if unable, by the patient's family).  Yes   Patient/family informed of their freedom to choose among providers that offer the needed level of care, that participate in Medicare, Medicaid or managed care program needed by the patient, have an available bed and are willing to accept the patient.  Yes   Patient/family informed of [facility]'s ownership interest in [facility] and [facility], as well as of the fact that they are under no obligation to receive care at these facilities.  PASRR submitted to EDS on       PASRR number received on       Existing PASRR number confirmed on 02/11/18     FL2 transmitted to all facilities in geographic area requested by pt/family on 02/11/18     FL2 transmitted to all facilities within larger geographic area on       Patient informed that his/her managed care company has contracts with or will negotiate with certain facilities, including the following:            Patient/family informed of bed offers received.  Patient chooses bed at       Physician recommends and patient chooses bed at      Patient to be transferred to   on  .  Patient to be transferred to facility by       Patient family notified on   of transfer.  Name of family member notified:        PHYSICIAN       Additional Comment: CSW and patient went over list of potential placements after pt confirmed that she wants to go to SNF for rehab.   Pt identified several places that are acceptable to her, and stated she would give CSW call to confirm after she speaks with family.  Was OK with sending info out to tentative picks.   _______________________________________________ Trish Mage, LCSW 02/12/2018, 8:37 AM

## 2018-02-12 NOTE — Progress Notes (Signed)
  Echocardiogram 2D Echocardiogram with definity has been performed.  Darlina Sicilian M 02/12/2018, 11:41 AM

## 2018-02-12 NOTE — Progress Notes (Signed)
Physical Therapy Treatment Patient Details Name: Sarah [name] MRN: [id number] DOB: Jul 10, 1965 Today's Date: 02/12/2018    History of Present Illness Sarah [name]  is a 52 y.o. 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  Patient states that symptoms have been going on for past 3 days.  She also complains of nausea but no vomiting.  Denies any fever.  Denies chest pain.    PT Comments    Patient demonstrates increased tolerance for activity with less c/o nausea.  Patient required Max/total assist to move legs when rolling and going from side lying to sitting, able to pull self up with BUE using bed rail and tolerated sitting up at bedside for approximately 20-35 minutes.  Patient demonstrates fair sitting balance supporting self with BUE, when unsupported, patient frequently leans backward requiring Min assist to keep trunk in midline.  Patient able to use BUE for scooting laterally with Max/total assist, but requires frequent rest breaks and requested to go back to bed due to nausea/dizziness.  Patient will benefit from continued physical therapy in hospital and recommended venue below to increase strength, balance, endurance for safe ADLs and gait.    Follow Up Recommendations  SNF     Equipment Recommendations  None recommended by PT    Recommendations for Other Services       Precautions / Restrictions Precautions Precautions: None Precaution Comments: paraplegic Restrictions Weight Bearing Restrictions: No    Mobility  Bed Mobility Overal bed mobility: Needs Assistance Bed Mobility: Supine to Sit;Sit to Supine     Supine to sit: Max assist Sit to supine: Max assist   General bed mobility comments: able to use bed rail to help pull self up to sidelying, but  required max assist to come to complete sitting  Transfers                    Ambulation/Gait                 Stairs             Wheelchair Mobility    Modified Rankin (Stroke Patients Only)       Balance Overall balance assessment: Needs assistance Sitting-balance support: Feet supported;No upper extremity supported Sitting balance-Leahy Scale: Poor Sitting balance - Comments: fair using BUE to support self                                    Cognition Arousal/Alertness: Awake/alert Behavior During Therapy: WFL for tasks assessed/performed Overall Cognitive Status: Within Functional Limits for tasks assessed                                        Exercises Other Exercises Other Exercises: PROM to BLE all planes of motion x 10 reps each    General Comments        Pertinent Vitals/Pain Pain Assessment: No/denies pain    Home Living                      Prior Function            PT Goals (current goals  can now be found in the care plan section) Acute Rehab PT Goals Patient Stated Goal: return home after rehab PT Goal Formulation: With patient/family Time For Goal Achievement: 02/25/18 Potential to Achieve Goals: Fair Progress towards PT goals: Progressing toward goals    Frequency    Min 3X/week      PT Plan Current plan remains appropriate    Co-evaluation              AM-PAC PT "6 Clicks" Daily Activity  Outcome Measure  Difficulty turning over in bed (including adjusting bedclothes, sheets and blankets)?: Unable Difficulty moving from lying on back to sitting on the side of the bed? : Unable Difficulty sitting down on and standing up from a chair with arms (e.g., wheelchair, bedside commode, etc,.)?: Unable Help needed moving to and from a bed to chair (including a wheelchair)?: Total Help needed walking in hospital room?: Total Help needed climbing 3-5 steps with a railing?  : Total 6 Click Score: 6    End of Session Equipment Utilized During Treatment: Oxygen Activity Tolerance: Patient tolerated treatment well;Patient limited by fatigue(patient limited by mild nausea) Patient left: in bed;with call bell/phone within reach;with family/visitor present Nurse Communication: Mobility status PT Visit Diagnosis: Other symptoms and signs involving the nervous system (R29.898);Unsteadiness on feet (R26.81);Muscle weakness (generalized) (M62.81)     Time: 1125-1203 PT Time Calculation (min) (ACUTE ONLY): 38 min  Charges:  $Therapeutic Exercise: 8-22 mins $Therapeutic Activity: 23-37 mins                     1:50 PM, 02/12/18 Lonell Grandchild, MPT Physical Therapist with [facility] [phone] office [phone] mobile phone

## 2018-02-13 ENCOUNTER — Inpatient Hospital Stay (HOSPITAL_COMMUNITY): Payer: Medicare Other

## 2018-02-13 LAB — COMPREHENSIVE METABOLIC PANEL
ALBUMIN: 1.7 g/dL — AB (ref 3.5–5.0)
ALK PHOS: 97 U/L (ref 38–126)
ALT: 12 U/L (ref 0–44)
ANION GAP: 5 (ref 5–15)
AST: 28 U/L (ref 15–41)
BUN: 31 mg/dL — ABNORMAL HIGH (ref 6–20)
CALCIUM: 7.5 mg/dL — AB (ref 8.9–10.3)
CO2: 30 mmol/L (ref 22–32)
Chloride: 104 mmol/L (ref 98–111)
Creatinine, Ser: 1.19 mg/dL — ABNORMAL HIGH (ref 0.44–1.00)
GFR calc non Af Amer: 52 mL/min — ABNORMAL LOW (ref >60)
Glucose, Bld: 136 mg/dL — ABNORMAL HIGH (ref 70–99)
Potassium: 3.7 mmol/L (ref 3.5–5.1)
SODIUM: 139 mmol/L (ref 135–145)
Total Bilirubin: 0.9 mg/dL (ref 0.3–1.2)
Total Protein: 5.3 g/dL — ABNORMAL LOW (ref 6.5–8.1)

## 2018-02-13 LAB — CBC WITH DIFFERENTIAL/PLATELET
Abs Immature Granulocytes: 0.21 10*3/uL — ABNORMAL HIGH (ref 0.00–0.07)
BASOS ABS: 0 10*3/uL (ref 0.0–0.1)
Basophils Relative: 0 %
EOS ABS: 0 10*3/uL (ref 0.0–0.5)
Eosinophils Relative: 0 %
HCT: 30.9 % — ABNORMAL LOW (ref 36.0–46.0)
Hemoglobin: 9.4 g/dL — ABNORMAL LOW (ref 12.0–15.0)
Immature Granulocytes: 3 %
LYMPHS ABS: 0.6 10*3/uL — AB (ref 0.7–4.0)
Lymphocytes Relative: 9 %
MCH: 27.6 pg (ref 26.0–34.0)
MCHC: 30.4 g/dL (ref 30.0–36.0)
MCV: 90.9 fL (ref 80.0–100.0)
Monocytes Absolute: 0.2 10*3/uL (ref 0.1–1.0)
Monocytes Relative: 3 %
NEUTROS ABS: 5.7 10*3/uL (ref 1.7–7.7)
NEUTROS PCT: 85 %
NRBC: 0 % (ref 0.0–0.2)
Platelets: 129 10*3/uL — ABNORMAL LOW (ref 150–400)
RBC: 3.4 MIL/uL — ABNORMAL LOW (ref 3.87–5.11)
RDW: 15.9 % — ABNORMAL HIGH (ref 11.5–15.5)
WBC: 6.7 10*3/uL (ref 4.0–10.5)

## 2018-02-13 LAB — MAGNESIUM: MAGNESIUM: 2.4 mg/dL (ref 1.7–2.4)

## 2018-02-13 MED ORDER — IOHEXOL 300 MG/ML  SOLN
75.0000 mL | Freq: Once | INTRAMUSCULAR | Status: AC | PRN
  Administered 2018-02-13: 75 mL via INTRAVENOUS

## 2018-02-13 MED ORDER — ALPRAZOLAM 0.25 MG PO TABS
0.2500 mg | ORAL_TABLET | Freq: Three times a day (TID) | ORAL | Status: DC | PRN
  Administered 2018-02-13 – 2018-02-23 (×11): 0.25 mg via ORAL
  Filled 2018-02-13 (×11): qty 1

## 2018-02-13 NOTE — Progress Notes (Signed)
02/13/2018 6:20 PM  CT brain results reviewed.  Metastatic disease seen.  Planning for palliative care meeting with family early next week for goals of care.  MRI recommended but would not be able to get until Monday as we have no MRI service on weekends.    Murvin Natal MD

## 2018-02-13 NOTE — Progress Notes (Signed)
PROGRESS NOTE  Sarah [name]  QTM:[id number]  DOB: 01/14/1966  DOA: 02/03/2018 PCP: [name], MD   Brief Admission Hx: 52 y.o. 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  Patient states that symptoms have been going on for past 3 days.  She also complains of nausea but no vomiting.  Denies any fever.  Denies chest pain.  Pt was admitted with a multifocal pneumonia.    MDM/Assessment & Plan:    1. Healthcare associated pneumonia-chest x-ray shows masslike opacity, likely infectious as it developed quickly since December 13, 2017.  CT confirms a multifocal pneumonia.  Pt was initially started on ceftriaxone and Zithromax but given the severity of infection and recent postop status, coverage was broadened to cefepime/vancomycin.  MRSA (+).   Blood cultures NGTD.  Strep pneumo and legionella antigens negative.  Progress will be slow since she has a very weak cough reflex and poor inspiratory effort.  Continue mucomyst nebs. Continue steroids.  Added chest physiotherapy with percussion.  CXR 11/22 shows progression, and added back vancomycin, continue cefepime.  IV lasix ordered. Echo with normal EF.   2. History of pulmonary embolism-continue Xarelto for full anticoagulation.  3. 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 Pt has been treated with radiation and chemotherapy.  Follow up with oncology when discharged.   4. Hypothyroidism-continue Synthroid 5. Hypotension - improved, soft blood pressures likely multifactorial from dehydration, infection, anemia, paraplegia.  Pt is asymptomatic and MAP>65.    6. Anemia of chronic disease - Transfused 2 units PRBC 11/15 with improvement of hemoglobin.  Appreciate GI consult and assistance.  No gross GI  bleeding. CT abd/pelvis negative for retroperitoneal bleed or hematoma.  Hg up to 9 after transfusion.  Following.    7. Hypokalemia -replaced.  Magnesium is normal. 8. Stage 2 sacral pressure injury and stage 1 left heel injury - improving, continue wound care protocol. Consulted WOC nurse.  9. Fecal impaction - Resolved.  Was seen on CT abdomen/pelvis -feels better after receiving enemas.  Continue daily laxatives.  10. Anasarca.  Related to hypoalbuminemia.  Started on Lasix.  Patient has had good urine output.  Dietitian consulted to assist with protein supplementation.  11. Pulmonary edema seen on CXR 11/22.  IV lasix ordered.  Echocardiogram with normal EF and no diastolic dysfunction. No CHF suspected.     DVT Prophylaxis-   Xarelto  Family Communication:  Discussed with family at the bedside  Code Status:  Full code, palliative care consulted to help address further goals of care.  Consultants:  GI  Palliative care (unable to see patient until 11/25)  Antimicrobials:  Ceftriaxone 11/14 >11/16  Azithromycin 11/14>11/16  Vancomycin 11/16 >11/19, 11/22  Cefepime 11/16 >  Pressure Injury Documentation: Pressure Injury 02/04/18 Stage II -  Partial thickness loss of dermis presenting as a shallow open ulcer with a red, pink wound bed without slough. (Active)  02/04/18 0130   Location: Sacrum  Location Orientation: Mid  Staging: Stage II -  Partial thickness loss of dermis presenting as a shallow open ulcer with a red, pink wound bed without slough.  Wound Description (Comments):   Present on Admission: Yes     Pressure Injury 02/04/18 Stage I -  Intact skin with non-blanchable redness of a localized area usually over a bony  prominence. (Active)  02/04/18 0120   Location: Heel  Location Orientation: Left  Staging: Stage I -  Intact skin with non-blanchable redness of a localized area usually over a bony prominence.  Wound Description (Comments):   Present on  Admission: Yes   Echocardiogram 02/12/18 Study Conclusions  - Left ventricle: The cavity size was normal. Wall thickness was   increased in a pattern of mild LVH. Systolic function was normal.   The estimated ejection fraction was in the range of 55% to 60%.   Wall motion was normal; there were no regional wall motion   abnormalities. Left ventricular diastolic function parameters   were normal. - Aortic valve: Valve area (Vmax): 1.78 cm^2. - Atrial septum: No defect or patent foramen ovale was identified.   Subjective: Pt reports severe anxiety, was unable to sleep, still with SOB, unable to cough up any sputum.    Objective: Vitals:   02/12/18 2040 02/12/18 2136 02/13/18 0635 02/13/18 0754  BP:  110/75 105/67   Pulse: 73 81 76   Resp: 16 (!) 24 (!) 21   Temp:  (!) 97.4 F (36.3 C) 97.8 F (36.6 C)   TempSrc:  Oral Oral   SpO2: 98% 93% 99% 98%  Weight:      Height:        Intake/Output Summary (Last 24 hours) at 02/13/2018 2458 Last data filed at 02/13/2018 0500 Gross per 24 hour  Intake 1548.67 ml  Output 3400 ml  Net -1851.33 ml   Filed Weights   02/08/18 0500 02/10/18 0427 02/11/18 0500  Weight: 111.8 kg 111 kg 110.1 kg    REVIEW OF SYSTEMS  As per history otherwise all reviewed and reported negative  Exam:  General exam: Alert, awake, oriented x 3 Respiratory system: coarse anterior congestion, rales unchanged. No increased WOB. Cardiovascular system:normal s1,s2 sounds. No murmurs, rubs, gallops. Gastrointestinal system: Abdomen is nondistended, soft and nontender. No organomegaly or masses felt. Normal bowel sounds heard. Extremities: 1+ edema bilateral LEs.  Skin: No rashes, lesions or ulcers Psychiatry: Judgement and insight appear normal. Mood & affect appropriate.   Data Reviewed: Basic Metabolic Panel: Recent Labs  Lab 02/07/18 0410  02/09/18 0603 02/10/18 0408 02/11/18 0359 02/12/18 0427 02/13/18 0711  NA 140   < > 138 137 139 139 139   K 4.0   < > 3.5 4.3 4.2 4.1 3.7  CL 113*   < > 107 105 108 106 104  CO2 23   < > 26 26 26 27 30   GLUCOSE 112*   < > 105* 141* 144* 127* 136*  BUN 11   < > 14 19 27* 30* 31*  CREATININE 0.76   < > 1.04* 1.15* 1.18* 1.17* 1.19*  CALCIUM 7.3*   < > 7.5* 7.8* 7.7* 7.8* 7.5*  MG 1.9  --   --   --   --  2.4 2.4  PHOS  --   --   --   --   --  3.3  --    < > = values in this interval not displayed.   Liver Function Tests: Recent Labs  Lab 02/08/18 0425 02/09/18 0603 02/10/18 0408 02/12/18 0427 02/13/18 0711  AST 12* 15 15 27 28   ALT 8 7 8 12 12   ALKPHOS 117 117 120 106 97  BILITOT 1.0 0.8 0.7 0.5 0.9  PROT 4.8* 5.0* 5.2* 5.4* 5.3*  ALBUMIN 1.7* 1.7* 1.6* 1.7* 1.7*   No results for input(s): LIPASE, AMYLASE in  the last 168 hours. No results for input(s): AMMONIA in the last 168 hours. CBC: Recent Labs  Lab 02/08/18 0425 02/09/18 0603 02/10/18 0408 02/11/18 0359 02/12/18 0427 02/13/18 0711  WBC 7.8 8.2 7.9 7.4 8.2 6.7  NEUTROABS 6.5 6.9 6.9 6.4  --  5.7  HGB 9.9* 9.7* 9.5* 9.3* 9.7* 9.4*  HCT 32.1* 31.5* 30.8* 30.2* 32.0* 30.9*  MCV 91.2 90.8 91.7 91.0 92.8 90.9  PLT 126* 137* 124* 145* 159 129*   Cardiac Enzymes: No results for input(s): CKTOTAL, CKMB, CKMBINDEX, TROPONINI in the last 168 hours. CBG (last 3)  No results for input(s): GLUCAP in the last 72 hours. Recent Results (from the past 240 hour(s))  Blood culture (routine x 2)     Status: None   Collection Time: 02/03/18 10:34 PM  Result Value Ref Range Status   Specimen Description BLOOD LEFT HAND  Final   Special Requests   Final    BOTTLES DRAWN AEROBIC AND ANAEROBIC Blood Culture adequate volume   Culture   Final    NO GROWTH 5 DAYS Performed at [facility], [address], [place], [place] [address]    Report Status 02/08/2018 FINAL  Final  Blood culture (routine x 2)     Status: None   Collection Time: 02/03/18 10:46 PM  Result Value Ref Range Status   Specimen Description RIGHT ANTECUBITAL  Final     Special Requests   Final    BOTTLES DRAWN AEROBIC AND ANAEROBIC Blood Culture adequate volume   Culture   Final    NO GROWTH 5 DAYS Performed at [facility], [address], [place], [place] [address]    Report Status 02/08/2018 FINAL  Final  MRSA PCR Screening     Status: Abnormal   Collection Time: 02/04/18  1:30 AM  Result Value Ref Range Status   MRSA by PCR POSITIVE (A) NEGATIVE Final    Comment:        The GeneXpert MRSA Assay (FDA approved for NASAL specimens only), is one component of a comprehensive MRSA colonization surveillance program. It is not intended to diagnose MRSA infection nor to guide or monitor treatment for MRSA infections. RESULT CALLED TO, READ BACK BY AND VERIFIED WITH: Carlye Grippe R. AT [id number] ON [id number] BY THOMPSON S. Performed at [facility], [address], [place], [place] [address]      Studies: Dg Chest [facility] 1 View  Result Date: 02/12/2018 CLINICAL DATA:  Shortness of breath.  History of thyroid carcinoma EXAM: PORTABLE CHEST 1 VIEW COMPARISON:  February 05, 2018 FINDINGS: There is extensive airspace opacity in both upper lobes, more on the right than on the left, similar to prior study. There is underlying interstitial and patchy alveolar edema elsewhere, increased from most recent prior study. There is cardiomegaly with pulmonary venous hypertension. No adenopathy. Central catheter tip is at the cavoatrial junction. Postoperative changes are noted in the upper thoracic region, stable. IMPRESSION: Increase in interstitial and alveolar opacity, consistent with either progression of widespread pneumonia or congestive heart failure. Both entities may be present concurrently. Upper lobe consolidation, more on the right than on the left, persists. Cardiac silhouette is stable. Central catheter tip at cavoatrial junction. No pneumothorax. Electronically Signed   By: Lowella Grip III M.D.   On: 02/12/2018 07:41   Scheduled Meds: . sodium chloride    Intravenous Once  . sodium chloride   Intravenous Once  . acetylcysteine  4 mL Nebulization BID  . Chlorhexidine Gluconate Cloth  6  each Topical Daily  . famotidine  20 mg Oral BID  . feeding supplement (ENSURE ENLIVE)  237 mL Oral TID BM  . feeding supplement (PRO-STAT SUGAR FREE 64)  30 mL Oral TID BM  . furosemide  40 mg Intravenous Daily  . guaiFENesin  1,200 mg Oral BID  . ipratropium-albuterol  3 mL Nebulization TID  . levothyroxine  150 mcg Oral Q0600  . loratadine  10 mg Oral Daily  . mouth rinse  15 mL Mouth Rinse BID  . methylPREDNISolone (SOLU-MEDROL) injection  40 mg Intravenous Q12H  . multivitamin with minerals  1 tablet Oral Daily  . pantoprazole  40 mg Oral Daily  . polyethylene glycol  17 g Oral Daily  . potassium chloride  40 mEq Oral Daily  . rivaroxaban  20 mg Oral Q supper  . senna  1 tablet Oral BID  . sertraline  50 mg Oral Daily  . sodium phosphate  1 enema Rectal Daily   Continuous Infusions: . ceFEPime (MAXIPIME) IV 2 g (02/13/18 0353)  . vancomycin 750 mg (02/12/18 2322)    Active Problems:   Hurthle cell neoplasm of thyroid   Paraplegia at T4 level (HCC)   Constipation   Constipation due to neurogenic bowel   Closed fracture of left distal femur (HCC)   Pressure injury of skin   Anemia   Chronic anticoagulation   Fecal impaction (HCC)   HCAP (healthcare-associated pneumonia)  Time spent: 26 mins  Irwin Brakeman, MD [facility] Pager [phone]  If 7PM-7AM, please contact night-coverage [email or web address] Password [email or web address] 02/13/2018, 8:32 AM    LOS: 9 days

## 2018-02-13 NOTE — Consult Note (Addendum)
Consult requested by: [facility] hospitalist, Dr. 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  She was able to cough up yellow sputum but is having trouble coughing it up now.  She had some blood in her sputum as well.  She says she is felt bad.  She did not have any definite fever.  She had nausea but no vomiting.  No other abdominal symptoms.  No headache sinus congestion etc.  Past Medical History:  Diagnosis Date  . 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 Chronic back pain   . Complication of anesthesia    Nausea and vomiting  . DDD (degenerative disc disease), cervical   . DJD (degenerative joint disease)   . Family history of adverse reaction to anesthesia    MOTHER HAS NAUSEA   . GERD (gastroesophageal reflux disease)   . History of rectal fissure   . HIT (heparin-induced thrombocytopenia) ([place])   . Hypertension   . Hypothyroidism   . Obesity   . Paraplegia at T4 level ([place])   . Pulmonary embolus, right ([place]) 2015  . Rotator cuff tendonitis right     Family History  Problem Relation Age of Onset  . Hypertension Mother   . Diabetes Mother   . Hypertension Father   . Stroke Father   . Diabetes Father   . Diabetes Sister   . Diabetes Sister      Social History   Socioeconomic History  . Marital status: Single    Spouse name: Not on file  . Number of children: Not on file  . Years of education: Not on file  . Highest education level: Not on file  Occupational History  . Not on file  Social Needs  . Financial resource strain: Not on file  . Food insecurity:    Worry: Not on file    Inability: Not on file  . Transportation needs:    Medical: Not on file     Non-medical: Not on file  Tobacco Use  . Smoking status: Never Smoker  . Smokeless tobacco: Never Used  Substance and Sexual Activity  . Alcohol use: No  . Drug use: No  . Sexual activity: Not Currently  Lifestyle  . Physical activity:    Days per week: Not on file    Minutes per session: Not on file  . Stress: Not on file  Relationships  . Social connections:    Talks on phone: Not on file    Gets together: Not on file    Attends religious service: Not on file    Active member of club or organization: Not on file    Attends meetings of clubs or organizations: Not on file    Relationship status: Not on file  Other Topics Concern  . Not on file  Social History Narrative  . Not on file     ROS: Except as mentioned 10 point review of systems is negative    Objective: Vital signs in last 24 hours: Temp:  [97.4 F (36.3 C)-97.8 F (36.6 C)] 97.8 F (36.6 C) (11/23 0635) Pulse Rate:  [73-81] 76 (11/23 0635) Resp:  [16-24] 21 (11/23 0635) BP: (105-110)/(67-75) 105/67 (11/23 0635) SpO2:  [93 %-99 %] 98 % (11/23  0175) Weight change:  Last BM Date: 02/12/18  Intake/Output from previous day: 11/22 0701 - 11/23 0700 In: 1548.7 [P.O.:480; I.V.:10; IV Piggyback:1058.7] Out: 3400 [Urine:3400]  PHYSICAL EXAM Constitutional: She is awake and alert and in no acute distress.  She is wearing nasal oxygen.  Eyes: Pupils react.  EOMI.  Ears nose mouth and throat: Her mucous membranes are mildly dry.  Throat is clear.  Hearing is grossly normal.  Cardiovascular: Her heart is regular with normal heart sounds.  Respiratory: Her respiratory effort is increased.  She has bilateral rhonchi and wheezing.  Gastrointestinal: Her abdomen is soft with no masses.  Skin: Skin turgor fair.  Musculoskeletal: She has paraplegia.  Neurological: She has paraplegia psychiatric: Normal mood and affect  Lab Results: Basic Metabolic Panel: Recent Labs    02/12/18 0427 02/13/18 0711  NA 139 139  K 4.1  3.7  CL 106 104  CO2 27 30  GLUCOSE 127* 136*  BUN 30* 31*  CREATININE 1.17* 1.19*  CALCIUM 7.8* 7.5*  MG 2.4 2.4  PHOS 3.3  --    Liver Function Tests: Recent Labs    02/12/18 0427 02/13/18 0711  AST 27 28  ALT 12 12  ALKPHOS 106 97  BILITOT 0.5 0.9  PROT 5.4* 5.3*  ALBUMIN 1.7* 1.7*   No results for input(s): LIPASE, AMYLASE in the last 72 hours. No results for input(s): AMMONIA in the last 72 hours. CBC: Recent Labs    02/11/18 0359 02/12/18 0427 02/13/18 0711  WBC 7.4 8.2 6.7  NEUTROABS 6.4  --  5.7  HGB 9.3* 9.7* 9.4*  HCT 30.2* 32.0* 30.9*  MCV 91.0 92.8 90.9  PLT 145* 159 129*   Cardiac Enzymes: No results for input(s): CKTOTAL, CKMB, CKMBINDEX, TROPONINI in the last 72 hours. BNP: No results for input(s): PROBNP in the last 72 hours. D-Dimer: No results for input(s): DDIMER in the last 72 hours. CBG: No results for input(s): GLUCAP in the last 72 hours. Hemoglobin A1C: No results for input(s): HGBA1C in the last 72 hours. Fasting Lipid Panel: No results for input(s): CHOL, HDL, LDLCALC, TRIG, CHOLHDL, LDLDIRECT in the last 72 hours. Thyroid Function Tests: No results for input(s): TSH, T4TOTAL, FREET4, T3FREE, THYROIDAB in the last 72 hours. Anemia Panel: No results for input(s): VITAMINB12, FOLATE, FERRITIN, TIBC, IRON, RETICCTPCT in the last 72 hours. Coagulation: No results for input(s): LABPROT, INR in the last 72 hours. Urine Drug Screen: Drugs of Abuse  No results found for: LABOPIA, COCAINSCRNUR, LABBENZ, AMPHETMU, THCU, LABBARB  Alcohol Level: No results for input(s): ETH in the last 72 hours. Urinalysis: No results for input(s): COLORURINE, LABSPEC, PHURINE, GLUCOSEU, HGBUR, BILIRUBINUR, KETONESUR, PROTEINUR, UROBILINOGEN, NITRITE, LEUKOCYTESUR in the last 72 hours.  Invalid input(s): APPERANCEUR Misc. Labs:   ABGS: No results for input(s): PHART, PO2ART, TCO2, HCO3 in the last 72 hours.  Invalid input(s):  PCO2   MICROBIOLOGY: Recent Results (from the past 240 hour(s))  Blood culture (routine x 2)     Status: None   Collection Time: 02/03/18 10:34 PM  Result Value Ref Range Status   Specimen Description BLOOD LEFT HAND  Final   Special Requests   Final    BOTTLES DRAWN AEROBIC AND ANAEROBIC Blood Culture adequate volume   Culture   Final    NO GROWTH 5 DAYS Performed at [facility], [address], [place], [place] [address]    Report Status 02/08/2018 FINAL  Final  Blood culture (routine x 2)  Status: None   Collection Time: 02/03/18 10:46 PM  Result Value Ref Range Status   Specimen Description RIGHT ANTECUBITAL  Final   Special Requests   Final    BOTTLES DRAWN AEROBIC AND ANAEROBIC Blood Culture adequate volume   Culture   Final    NO GROWTH 5 DAYS Performed at [facility], [address], [place], [place] [address]    Report Status 02/08/2018 FINAL  Final  MRSA PCR Screening     Status: Abnormal   Collection Time: 02/04/18  1:30 AM  Result Value Ref Range Status   MRSA by PCR POSITIVE (A) NEGATIVE Final    Comment:        The GeneXpert MRSA Assay (FDA approved for NASAL specimens only), is one component of a comprehensive MRSA colonization surveillance program. It is not intended to diagnose MRSA infection nor to guide or monitor treatment for MRSA infections. RESULT CALLED TO, READ BACK BY AND VERIFIED WITH: Carlye Grippe R. AT [id number] ON [id number] BY THOMPSON S. Performed at [facility], [address] [place], [place] [address]     Studies/Results: Dg Chest Port 1 View  Result Date: 02/13/2018 CLINICAL DATA:  Follow-up exam.  Pulmonary edema EXAM: PORTABLE CHEST 1 VIEW COMPARISON:  Chest radiograph 02/12/2018 FINDINGS: Right upper extremity PICC line tip projects over the superior vena cava. Stable cardiac and mediastinal contours. Re-demonstrated bilateral predominately upper lung airspace consolidation. Mid lower lung patchy consolidation is grossly similar.  Probable small left pleural effusion. No pneumothorax. IMPRESSION: Similar-appearing bilateral consolidation, upper lobe predominant, potentially multifocal pneumonia. Electronically Signed   By: Lovey Newcomer M.D.   On: 02/13/2018 09:02   Dg Chest Port 1 View  Result Date: 02/12/2018 CLINICAL DATA:  Shortness of breath.  History of thyroid carcinoma EXAM: PORTABLE CHEST 1 VIEW COMPARISON:  February 05, 2018 FINDINGS: There is extensive airspace opacity in both upper lobes, more on the right than on the left, similar to prior study. There is underlying interstitial and patchy alveolar edema elsewhere, increased from most recent prior study. There is cardiomegaly with pulmonary venous hypertension. No adenopathy. Central catheter tip is at the cavoatrial junction. Postoperative changes are noted in the upper thoracic region, stable. IMPRESSION: Increase in interstitial and alveolar opacity, consistent with either progression of widespread pneumonia or congestive heart failure. Both entities may be present concurrently. Upper lobe consolidation, more on the right than on the left, persists. Cardiac silhouette is stable. Central catheter tip at cavoatrial junction. No pneumothorax. Electronically Signed   By: Lowella Grip III M.D.   On: 02/12/2018 07:41    Medications:  Prior to Admission:  Medications Prior to Admission  Medication Sig Dispense Refill Last Dose  . acetaminophen (TYLENOL) 325 MG tablet Take 2 tablets (650 mg total) by mouth every 4 (four) hours as needed for mild pain ((score 1 to 3) or temp > 100.5).   Past Month at Unknown time  . baclofen (LIORESAL) 10 MG tablet Take 25 mg by mouth 4 (four) times daily as needed for muscle spasms.    02/03/2018 at Unknown time  . famotidine (PEPCID) 20 MG tablet Take 1 tablet (20 mg total) by mouth 2 (two) times daily. 60 tablet 0 Past Week at Unknown time  . fluticasone (FLONASE) 50 MCG/ACT nasal spray Place 2 sprays into both nostrils daily. 16 g  6 02/02/2018 at Unknown time  . levothyroxine (SYNTHROID, LEVOTHROID) 150 MCG tablet Take 150 mcg by mouth daily before breakfast.    02/02/2018 at Unknown  time  . loratadine (CLARITIN) 10 MG tablet Take 1 tablet (10 mg total) by mouth daily.   Past Week at Unknown time  . ondansetron (ZOFRAN) 4 MG tablet Take 1 tablet by mouth every 6 (six) hours as needed for nausea or vomiting.    02/02/2018 at Unknown time  . oxyCODONE (ROXICODONE) 5 MG immediate release tablet Take 1 tablet (5 mg total) by mouth every 4 (four) hours as needed. 20 tablet 0 unknown  . oxyCODONE-acetaminophen (PERCOCET/ROXICET) 5-325 MG tablet Take 0.5-1 tablets by mouth every 6 (six) hours as needed for severe pain.   02/03/2018 at Unknown time  . pantoprazole (PROTONIX) 40 MG tablet Take 1 tablet by mouth daily.   Past Week at Unknown time  . polyethylene glycol (MIRALAX / GLYCOLAX) packet Take 17 g by mouth daily. 14 each 0 Past Week at Unknown time  . rivaroxaban (XARELTO) 20 MG TABS tablet Take 20 mg by mouth daily with supper.   02/02/2018 at 1700  . senna (SENOKOT) 8.6 MG TABS tablet Take 1 tablet (8.6 mg total) by mouth 2 (two) times daily. 120 each 0 Past Week at Unknown time  . sertraline (ZOLOFT) 50 MG tablet    02/02/2018 at Unknown time  . sucralfate (CARAFATE) 1 g tablet Take 1 tablet (1 g total) by mouth 4 (four) times daily -  with meals and at bedtime. Please dissolve tablet in 10-15 mL of water to create a slurry. Patient should drink 10-15 minutes before each meals and at bedtime to reduce painful swallowing. 120 tablet 2 02/02/2018 at Unknown time  . tiZANidine (ZANAFLEX) 2 MG tablet Take 2 mg by mouth every 8 (eight) hours as needed for muscle spasms.    Past Month at Unknown time  . sodium phosphate (FLEET) 7-19 GM/118ML ENEM Place 133 mLs (1 enema total) rectally daily as needed for severe constipation. (Patient not taking: Reported on 01/05/2018)  0 More than a month at Unknown time  . sucralfate (CARAFATE) 1  GM/10ML suspension Take 10 mLs (1 g total) by mouth 4 (four) times daily -  with meals and at bedtime. (Patient not taking: Reported on 02/03/2018) 420 mL 0 Not Taking at Unknown time   Scheduled: . sodium chloride   Intravenous Once  . sodium chloride   Intravenous Once  . acetylcysteine  4 mL Nebulization BID  . Chlorhexidine Gluconate Cloth  6 each Topical Daily  . famotidine  20 mg Oral BID  . feeding supplement (ENSURE ENLIVE)  237 mL Oral TID BM  . feeding supplement (PRO-STAT SUGAR FREE 64)  30 mL Oral TID BM  . furosemide  40 mg Intravenous Daily  . guaiFENesin  1,200 mg Oral BID  . ipratropium-albuterol  3 mL Nebulization TID  . levothyroxine  150 mcg Oral Q0600  . loratadine  10 mg Oral Daily  . mouth rinse  15 mL Mouth Rinse BID  . methylPREDNISolone (SOLU-MEDROL) injection  40 mg Intravenous Q12H  . multivitamin with minerals  1 tablet Oral Daily  . pantoprazole  40 mg Oral Daily  . polyethylene glycol  17 g Oral Daily  . potassium chloride  40 mEq Oral Daily  . rivaroxaban  20 mg Oral Q supper  . senna  1 tablet Oral BID  . sertraline  50 mg Oral Daily  . sodium phosphate  1 enema Rectal Daily   Continuous: . ceFEPime (MAXIPIME) IV 2 g (02/13/18 0353)  . vancomycin 750 mg (02/12/18 2322)   QAS:[id number], albuterol,  ALPRAZolam, baclofen, dextromethorphan, ondansetron **OR** ondansetron (ZOFRAN) IV, oxyCODONE, promethazine, sodium chloride flush, tiZANidine  Assesment: She has healthcare associated pneumonia and I agree the fact that the masslike density occurred so quickly that I think is likely related to infection.  I have personally reviewed the x-rays.  She is on appropriate antibiotic treatment, steroids, inhaled bronchodilators Mucomyst flutter valve and chest PT.  She is still having trouble moving secretions and I think it is basically because of her paraplegia  She has history of metastatic thyroid cancer and paraplegia from that  She is chronically  anticoagulated because of history of pulmonary emboli.  She has anemia of chronic disease which I think is doing better no gross bleeding despite her anticoagulation  She had pulmonary edema on chest x-ray 1122 and this has been treated.  Chest x-ray today looks better.  I have personally reviewed the x-rays and also the CT of abdomen and pelvis.  CT reveals multifocal pneumonia.   Active Problems:   Hurthle cell neoplasm of thyroid   Paraplegia at T4 level [facility])   Constipation   Constipation due to neurogenic bowel   Closed fracture of left distal femur ([facility])   Pressure injury of skin   Anemia   Chronic anticoagulation   Fecal impaction ([facility])   HCAP (healthcare-associated pneumonia)    Plan: Continue treatments.   LOS: 9 days   Julio Storr L 02/13/2018, 10:53 AM

## 2018-02-14 LAB — CBC WITH DIFFERENTIAL/PLATELET
ABS IMMATURE GRANULOCYTES: 0.17 10*3/uL — AB (ref 0.00–0.07)
BASOS ABS: 0 10*3/uL (ref 0.0–0.1)
Basophils Relative: 0 %
EOS ABS: 0 10*3/uL (ref 0.0–0.5)
EOS PCT: 0 %
HCT: 30.5 % — ABNORMAL LOW (ref 36.0–46.0)
HEMOGLOBIN: 9.3 g/dL — AB (ref 12.0–15.0)
Immature Granulocytes: 3 %
LYMPHS ABS: 0.5 10*3/uL — AB (ref 0.7–4.0)
LYMPHS PCT: 8 %
MCH: 27.4 pg (ref 26.0–34.0)
MCHC: 30.5 g/dL (ref 30.0–36.0)
MCV: 89.7 fL (ref 80.0–100.0)
MONO ABS: 0.2 10*3/uL (ref 0.1–1.0)
Monocytes Relative: 3 %
NRBC: 0 % (ref 0.0–0.2)
Neutro Abs: 5.4 10*3/uL (ref 1.7–7.7)
Neutrophils Relative %: 86 %
Platelets: 124 10*3/uL — ABNORMAL LOW (ref 150–400)
RBC: 3.4 MIL/uL — AB (ref 3.87–5.11)
RDW: 15.9 % — ABNORMAL HIGH (ref 11.5–15.5)
WBC: 6.3 10*3/uL (ref 4.0–10.5)

## 2018-02-14 LAB — COMPREHENSIVE METABOLIC PANEL
ALBUMIN: 1.8 g/dL — AB (ref 3.5–5.0)
ALT: 14 U/L (ref 0–44)
AST: 28 U/L (ref 15–41)
Alkaline Phosphatase: 96 U/L (ref 38–126)
Anion gap: 6 (ref 5–15)
BUN: 31 mg/dL — ABNORMAL HIGH (ref 6–20)
CHLORIDE: 101 mmol/L (ref 98–111)
CO2: 32 mmol/L (ref 22–32)
Calcium: 7.6 mg/dL — ABNORMAL LOW (ref 8.9–10.3)
Creatinine, Ser: 1.09 mg/dL — ABNORMAL HIGH (ref 0.44–1.00)
GFR calc non Af Amer: 58 mL/min — ABNORMAL LOW (ref >60)
Glucose, Bld: 119 mg/dL — ABNORMAL HIGH (ref 70–99)
Potassium: 3.6 mmol/L (ref 3.5–5.1)
SODIUM: 139 mmol/L (ref 135–145)
Total Bilirubin: 0.8 mg/dL (ref 0.3–1.2)
Total Protein: 5.4 g/dL — ABNORMAL LOW (ref 6.5–8.1)

## 2018-02-14 LAB — MAGNESIUM: MAGNESIUM: 2.3 mg/dL (ref 1.7–2.4)

## 2018-02-14 LAB — VANCOMYCIN, TROUGH: VANCOMYCIN TR: 38 ug/mL — AB (ref 15–20)

## 2018-02-14 MED ORDER — FLEET ENEMA 7-19 GM/118ML RE ENEM: 1.0000 | ENEMA | Freq: Every day | RECTAL | Status: DC | PRN

## 2018-02-14 NOTE — Progress Notes (Signed)
CRITICAL VALUE ALERT  Critical Value:  Vanc trough 38  Date & Time Notied:  02/14/2018 1120  Provider Notified: Dr. Wynetta Emery   Orders Received/Actions taken: Vancomycin held, repeat vancomycin tomorrow morning 02/15/2018

## 2018-02-14 NOTE — Progress Notes (Signed)
Pharmacy Antibiotic Note  Sarah [name] is a 52 y.o. female admitted on 02/03/2018  With healthcare associate pneumonia.  Pharmacy has been consulted for vancomycin and cefepime dosing. Vancomycin trough elevated, will hold Vanco and order another trough with AM labs.  Plan: Continue cefepime 2gm IV q8h- assess duration of therapy Hold Vanco and get repeat trough in AM F/u cxs and clinical progress    Height: 5\' 11"  (180.3 cm) Weight: 242 lb 11.6 oz (110.1 kg) IBW/kg (Calculated) : 70.8  Temp (24hrs), Avg:97.7 F (36.5 C), Min:97.7 F (36.5 C), Max:97.8 F (36.6 C)  Recent Labs  Lab 02/09/18 0603 02/10/18 0408 02/11/18 0359 02/12/18 0427 02/13/18 0711 02/14/18 0644 02/14/18 0949  WBC 8.2 7.9 7.4 8.2 6.7 6.3  --   CREATININE 1.04* 1.15* 1.18* 1.17* 1.19* 1.09*  --   VANCOTROUGH 50*  --   --   --   --   --  38*    Estimated Creatinine Clearance: 83.4 mL/min (A) (by C-G formula based on SCr of 1.09 mg/dL (H)).    Allergies  Allergen Reactions  . Bee Venom Anaphylaxis  . Keflex [Cephalexin] Nausea And Vomiting  . Tramadol Nausea And Vomiting  . Gabapentin Nausea And Vomiting  . Heparin Other (See Comments)    Weak positive platelets induced antibodies, SRA negative--2015  . Hydrochlorothiazide Nausea And Vomiting  . Hydrocodone Nausea And Vomiting  . Oxycodone-Acetaminophen Nausea And Vomiting  . Sorbitol Nausea Only    And abdominal distress  . Tizanidine     Fatigue    Anti-infectives (From admission, onward)   Start     Dose/Rate Route Frequency Ordered Stop   02/13/18 0000  vancomycin (VANCOCIN) IVPB 750 mg/150 ml premix  Status:  Discontinued     750 mg 150 mL/hr over 60 Minutes Intravenous Every 12 hours 02/12/18 0910 02/14/18 1056   02/12/18 2200  ceFEPIme (MAXIPIME) 2 g in sodium chloride 0.9 % 100 mL IVPB     2 g 200 mL/hr over 30 Minutes Intravenous Every 8 hours 02/12/18 1336 02/16/18 1959   02/12/18 1000  vancomycin (VANCOCIN) 2,000 mg in sodium  chloride 0.9 % 500 mL IVPB  Status:  Discontinued     2,000 mg 250 mL/hr over 120 Minutes Intravenous  Once 02/12/18 0853 02/12/18 0854   02/12/18 1000  vancomycin (VANCOCIN) 1,500 mg in sodium chloride 0.9 % 500 mL IVPB     1,500 mg 250 mL/hr over 120 Minutes Intravenous  Once 02/12/18 0854 02/12/18 1241   02/11/18 1145  ceFEPIme (MAXIPIME) 2 g in sodium chloride 0.9 % 100 mL IVPB     2 g 200 mL/hr over 30 Minutes Intravenous Every 8 hours 02/11/18 1146 02/12/18 1401   02/09/18 1200  ceFEPIme (MAXIPIME) 2 g in sodium chloride 0.9 % 100 mL IVPB  Status:  Discontinued     2 g 200 mL/hr over 30 Minutes Intravenous Every 8 hours 02/09/18 1031 02/11/18 1146   02/06/18 1900  vancomycin (VANCOCIN) 1,500 mg in sodium chloride 0.9 % 500 mL IVPB  Status:  Discontinued     1,500 mg 250 mL/hr over 120 Minutes Intravenous Every 12 hours 02/06/18 0827 02/09/18 1019   02/06/18 0830  vancomycin (VANCOCIN) 2,000 mg in sodium chloride 0.9 % 500 mL IVPB     2,000 mg 250 mL/hr over 120 Minutes Intravenous  Once 02/06/18 0826 02/06/18 1237   02/06/18 0830  ceFEPIme (MAXIPIME) 2 g in sodium chloride 0.9 % 100 mL IVPB  Status:  Discontinued     2 g 200 mL/hr over 30 Minutes Intravenous Every 8 hours 02/06/18 0826 02/09/18 1031   02/05/18 1500  cefTRIAXone (ROCEPHIN) 1 g in sodium chloride 0.9 % 100 mL IVPB  Status:  Discontinued     1 g 200 mL/hr over 30 Minutes Intravenous Every 24 hours 02/04/18 0412 02/04/18 1421   02/04/18 2200  cefTRIAXone (ROCEPHIN) 1 g in sodium chloride 0.9 % 100 mL IVPB  Status:  Discontinued     1 g 200 mL/hr over 30 Minutes Intravenous Every 24 hours 02/04/18 1421 02/06/18 0642   02/04/18 1300  azithromycin (ZITHROMAX) 500 mg in sodium chloride 0.9 % 250 mL IVPB  Status:  Discontinued     500 mg 250 mL/hr over 60 Minutes Intravenous Every 24 hours 02/04/18 0412 02/06/18 0642   02/03/18 2215  cefTRIAXone (ROCEPHIN) 1 g in sodium chloride 0.9 % 100 mL IVPB     1 g 200 mL/hr over  30 Minutes Intravenous  Once 02/03/18 2214 02/03/18 2322   02/03/18 2215  azithromycin (ZITHROMAX) 500 mg in sodium chloride 0.9 % 250 mL IVPB     500 mg 250 mL/hr over 60 Minutes Intravenous  Once 02/03/18 2214 02/04/18 0024      Antimicrobials this admission: 11/13 azithromycin >> 11/14 11/13 rocephin >> 11/15 11/16 cefepime >> 11/16 vancomycin >> 11/19 Vanco 11/22 >>   Microbiology results: 11/13 BCx: ng 11/14 MRSA PCR: positive  Thank you for allowing pharmacy to be a part of this patient's care.  Ramond Craver 02/14/2018 10:58 AM

## 2018-02-14 NOTE — Progress Notes (Signed)
PROGRESS NOTE  Sarah [name]  CZY:[id number]  DOB: Dec 12, 1965  DOA: 02/03/2018 PCP: [name], MD   Brief Admission Hx: 52 y.o. 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  Patient states that symptoms have been going on for past 3 days.  She also complains of nausea but no vomiting.  Denies any fever.  Denies chest pain.  Pt was admitted with a multifocal pneumonia.  Newly discovered brain mets seen on CT brain.   MDM/Assessment & Plan:   1. Healthcare associated pneumonia-chest x-ray shows masslike opacity, likely infectious as it developed quickly since December 13, 2017.  CT confirms a multifocal pneumonia.  Pt was initially started on ceftriaxone and Zithromax but given the severity of infection and recent postop status, coverage was broadened to cefepime/vancomycin.  MRSA (+).   Blood cultures NGTD.  Strep pneumo and legionella antigens negative.  Progress will be slow since she has a very weak cough reflex and poor inspiratory effort.  Continue mucomyst nebs. Continue steroids.  Added chest physiotherapy with percussion.  CXR 11/22 shows progression, and added back vancomycin, continue cefepime.  IV lasix ordered. Echo with normal EF.   2. History of pulmonary embolism-continue Xarelto for full anticoagulation.  3. 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 Pt has been treated with radiation and chemotherapy.  Follow up with oncology when discharged.  4. Newly discovered Brain Mets - Pt's CT head shows metastases to brain with dural involvement, MRI recommended, unsure if patient could tolerate MRI. Will try to touch basis with her oncologist tomorrow if he is in the office 11/25.  Palliative medicine consult pending.    4. Hypothyroidism-continue Synthroid 5. Hypotension - improved, soft blood pressures likely multifactorial from dehydration, infection, anemia, paraplegia.  Pt is asymptomatic and MAP>65.    6. Anemia of chronic disease - Transfused 2 units PRBC 11/15 with improvement of hemoglobin.  Appreciate GI consult and assistance.  No gross GI bleeding. CT abd/pelvis negative for retroperitoneal bleed or hematoma.  Hg up to 9 after transfusion.  Following.    7. Hypokalemia -replaced.  Magnesium is normal. 8. Stage 2 sacral pressure injury and stage 1 left heel injury - improving, continue wound care protocol. Consulted WOC nurse.  9. Fecal impaction - Resolved.  Was seen on CT abdomen/pelvis -feels better after receiving enemas.  Continue daily laxatives.  10. Anasarca.  Related to hypoalbuminemia.  Started on Lasix.  Patient has had good urine output.  Dietitian consulted to assist with protein supplementation.  11. Pulmonary edema seen on CXR 11/22.  IV lasix ordered.  Echocardiogram with normal EF and no diastolic dysfunction. No CHF suspected.     DVT Prophylaxis-   Xarelto  Family Communication:  Discussed with family at the bedside  Code Status:  Full code, palliative care consulted to help address further goals of care.  Family requesting meeting on 11/26 for goals of care.   Consultants:  GI  Palliative care (unable to see patient until 11/25) Family requesting goals of care meeting for 11/26  Antimicrobials:  Ceftriaxone 11/14 >11/16  Azithromycin 11/14>11/16  Vancomycin 11/16 >11/19, 11/22  Cefepime 11/16 >  Pressure Injury Documentation: Pressure Injury 02/04/18 Stage II -  Partial thickness loss of dermis presenting as a shallow open ulcer with a red, pink wound bed without  slough. (Active)  02/04/18 0130   Location: Sacrum  Location Orientation: Mid  Staging: Stage II -  Partial thickness loss of dermis presenting as a shallow open ulcer with a red, pink wound bed  without slough.  Wound Description (Comments):   Present on Admission: Yes     Pressure Injury 02/04/18 Stage I -  Intact skin with non-blanchable redness of a localized area usually over a bony prominence. (Active)  02/04/18 0120   Location: Heel  Location Orientation: Left  Staging: Stage I -  Intact skin with non-blanchable redness of a localized area usually over a bony prominence.  Wound Description (Comments):   Present on Admission: Yes   Echocardiogram 02/12/18 Study Conclusions  - Left ventricle: The cavity size was normal. Wall thickness was   increased in a pattern of mild LVH. Systolic function was normal.   The estimated ejection fraction was in the range of 55% to 60%.   Wall motion was normal; there were no regional wall motion   abnormalities. Left ventricular diastolic function parameters   were normal. - Aortic valve: Valve area (Vmax): 1.78 cm^2. - Atrial septum: No defect or patent foramen ovale was identified.  Subjective: Pt says that she is not sleeping well and she is very anxious.    Objective: Vitals:   02/13/18 2059 02/13/18 2139 02/14/18 0625 02/14/18 0750  BP:  140/88 (!) 142/82   Pulse: 76 75 76   Resp: 18 (!) 22 20   Temp:  97.7 F (36.5 C) 97.7 F (36.5 C)   TempSrc:  Oral Oral   SpO2: 98% 95% 99% 97%  Weight:      Height:        Intake/Output Summary (Last 24 hours) at 02/14/2018 0820 Last data filed at 02/13/2018 1700 Gross per 24 hour  Intake 1642.86 ml  Output 1700 ml  Net -57.14 ml   Filed Weights   02/08/18 0500 02/10/18 0427 02/11/18 0500  Weight: 111.8 kg 111 kg 110.1 kg    REVIEW OF SYSTEMS  As per history otherwise all reviewed and reported negative  Exam:  General exam: Alert, awake, oriented x 3 Respiratory system: coarse anterior congestion, rales unchanged. No increased WOB. Cardiovascular system:normal s1,s2 sounds. No murmurs, rubs, gallops. Gastrointestinal system: Abdomen is nondistended, soft and  nontender. No organomegaly or masses felt. Normal bowel sounds heard. Extremities: 1+ edema bilateral LEs.  Skin: No rashes, lesions or ulcers.  Psychiatry: Pt very anxious.    Data Reviewed: Basic Metabolic Panel: Recent Labs  Lab 02/10/18 0408 02/11/18 0359 02/12/18 0427 02/13/18 0711 02/14/18 0644  NA 137 139 139 139 139  K 4.3 4.2 4.1 3.7 3.6  CL 105 108 106 104 101  CO2 26 26 27 30  32  GLUCOSE 141* 144* 127* 136* 119*  BUN 19 27* 30* 31* 31*  CREATININE 1.15* 1.18* 1.17* 1.19* 1.09*  CALCIUM 7.8* 7.7* 7.8* 7.5* 7.6*  MG  --   --  2.4 2.4 2.3  PHOS  --   --  3.3  --   --    Liver Function Tests: Recent Labs  Lab 02/09/18 0603 02/10/18 0408 02/12/18 0427 02/13/18 0711 02/14/18 0644  AST 15 15 27 28 28   ALT 7 8 12 12 14   ALKPHOS 117 120 106 97 96  BILITOT 0.8 0.7 0.5 0.9 0.8  PROT 5.0* 5.2* 5.4* 5.3* 5.4*  ALBUMIN 1.7* 1.6* 1.7* 1.7* 1.8*   No results for input(s): LIPASE, AMYLASE in the last 168 hours.  No results for input(s): AMMONIA in the last 168 hours. CBC: Recent Labs  Lab 02/09/18 0603 02/10/18 0408 02/11/18 0359 02/12/18 0427 02/13/18 0711 02/14/18 0644  WBC 8.2 7.9 7.4 8.2 6.7 6.3  NEUTROABS 6.9 6.9 6.4  --  5.7 5.4  HGB 9.7* 9.5* 9.3* 9.7* 9.4* 9.3*  HCT 31.5* 30.8* 30.2* 32.0* 30.9* 30.5*  MCV 90.8 91.7 91.0 92.8 90.9 89.7  PLT 137* 124* 145* 159 129* 124*   Cardiac Enzymes: No results for input(s): CKTOTAL, CKMB, CKMBINDEX, TROPONINI in the last 168 hours. CBG (last 3)  No results for input(s): GLUCAP in the last 72 hours. No results found for this or any previous visit (from the past 240 hour(s)).   Studies: Ct Head W & Wo Contrast  Result Date: 02/13/2018 CLINICAL DATA:  Altered level of consciousness for a few weeks. History of metastatic thyroid cancer. EXAM: CT HEAD WITHOUT AND WITH CONTRAST TECHNIQUE: Contiguous axial images were obtained from the base of the skull through the vertex without and with intravenous contrast  CONTRAST:  11mL OMNIPAQUE IOHEXOL 300 MG/ML  SOLN COMPARISON:  CT HEAD September 02, 2017 FINDINGS: BRAIN: No intraparenchymal hemorrhage, mass effect nor midline shift. Borderline parenchymal brain volume loss for age. No hydrocephalus. No acute large vascular territory infarcts. No abnormal extra-axial fluid collections. Basal cisterns are patent. VASCULAR: Mild calcific atherosclerosis carotid siphon. SKULL/SOFT TISSUES: No skull fracture. 19 mm more conspicuous lytic lesion LEFT parietal calvarium (diploic space to inner table) with slight underlying dural enhancement. Small RIGHT frontal scalp lipoma. No significant soft tissue swelling. ORBITS/SINUSES: The included ocular globes and orbital contents are normal.Mild paranasal sinus mucosal thickening. Trace RIGHT mastoid effusion. OTHER: None. IMPRESSION: 1. No acute intracranial process. 2. 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 Recommend MRI of the brain with and without contrast. 3. Borderline parenchymal brain volume loss for age. 4. These results will be called to the ordering clinician or representative by the professional radiologist assistant, and communication documented in [facility] Dashboard. Electronically Signed   By: Elon Alas M.D.   On: 02/13/2018 18:13   Dg Chest Port 1 View  Result Date: 02/13/2018 CLINICAL DATA:  Follow-up exam.  Pulmonary edema EXAM: PORTABLE CHEST 1 VIEW COMPARISON:  Chest radiograph 02/12/2018 FINDINGS: Right upper extremity PICC line tip projects over the superior vena cava. Stable cardiac and mediastinal contours. Re-demonstrated bilateral predominately upper lung airspace consolidation. Mid lower lung patchy consolidation is grossly similar. Probable small left pleural effusion. No pneumothorax. IMPRESSION: Similar-appearing bilateral consolidation, upper lobe predominant, potentially multifocal pneumonia. Electronically Signed   By: Lovey Newcomer M.D.   On: 02/13/2018 09:02    Scheduled Meds: . sodium chloride   Intravenous Once  . sodium chloride   Intravenous Once  . acetylcysteine  4 mL Nebulization BID  . Chlorhexidine Gluconate Cloth  6 each Topical Daily  . famotidine  20 mg Oral BID  . feeding supplement (ENSURE ENLIVE)  237 mL Oral TID BM  . feeding supplement (PRO-STAT SUGAR FREE 64)  30 mL Oral TID BM  . furosemide  40 mg Intravenous Daily  . guaiFENesin  1,200 mg Oral BID  . ipratropium-albuterol  3 mL Nebulization TID  . levothyroxine  150 mcg Oral Q0600  . loratadine  10 mg Oral Daily  . mouth rinse  15 mL Mouth Rinse BID  . methylPREDNISolone (SOLU-MEDROL) injection  40 mg Intravenous Q12H  . multivitamin with minerals  1 tablet Oral Daily  . pantoprazole  40  mg Oral Daily  . polyethylene glycol  17 g Oral Daily  . potassium chloride  40 mEq Oral Daily  . rivaroxaban  20 mg Oral Q supper  . senna  1 tablet Oral BID  . sertraline  50 mg Oral Daily  . sodium phosphate  1 enema Rectal Daily   Continuous Infusions: . ceFEPime (MAXIPIME) IV 2 g (02/14/18 0426)  . vancomycin 750 mg (02/13/18 2345)    Active Problems:   Hurthle cell neoplasm of thyroid   Paraplegia at T4 level (HCC)   Constipation   Constipation due to neurogenic bowel   Closed fracture of left distal femur (HCC)   Pressure injury of skin   Anemia   Chronic anticoagulation   Fecal impaction (HCC)   HCAP (healthcare-associated pneumonia)  Time spent: 23 mins  Irwin Brakeman, MD [facility] Pager [phone]  If 7PM-7AM, please contact night-coverage [email or web address] Password [email or web address] 02/14/2018, 8:20 AM    LOS: 10 days

## 2018-02-14 NOTE — Progress Notes (Signed)
She is involved in intense conversation with family and I will defer visit today.  She is on maximum treatment and I do not think there is really anything to add but I agree with Dr. Durenda Age assessment that she is going to be very slow to respond

## 2018-02-15 ENCOUNTER — Inpatient Hospital Stay (HOSPITAL_COMMUNITY): Payer: Medicare Other

## 2018-02-15 ENCOUNTER — Encounter (HOSPITAL_COMMUNITY): Payer: Self-pay | Admitting: Primary Care

## 2018-02-15 DIAGNOSIS — Z515 Encounter for palliative care: Secondary | ICD-10-CM

## 2018-02-15 DIAGNOSIS — J189 Pneumonia, unspecified organism: Secondary | ICD-10-CM

## 2018-02-15 DIAGNOSIS — Z7189 Other specified counseling: Secondary | ICD-10-CM

## 2018-02-15 LAB — CBC WITH DIFFERENTIAL/PLATELET
Abs Immature Granulocytes: 0.08 10*3/uL — ABNORMAL HIGH (ref 0.00–0.07)
BASOS ABS: 0 10*3/uL (ref 0.0–0.1)
Basophils Relative: 0 %
EOS ABS: 0 10*3/uL (ref 0.0–0.5)
EOS PCT: 0 %
HEMATOCRIT: 23.8 % — AB (ref 36.0–46.0)
Hemoglobin: 7.4 g/dL — ABNORMAL LOW (ref 12.0–15.0)
Immature Granulocytes: 1 %
LYMPHS ABS: 0.4 10*3/uL — AB (ref 0.7–4.0)
Lymphocytes Relative: 7 %
MCH: 28 pg (ref 26.0–34.0)
MCHC: 31.1 g/dL (ref 30.0–36.0)
MCV: 90.2 fL (ref 80.0–100.0)
Monocytes Absolute: 0.2 10*3/uL (ref 0.1–1.0)
Monocytes Relative: 3 %
NRBC: 0 % (ref 0.0–0.2)
Neutro Abs: 5.8 10*3/uL (ref 1.7–7.7)
Neutrophils Relative %: 89 %
Platelets: 98 10*3/uL — ABNORMAL LOW (ref 150–400)
RBC: 2.64 MIL/uL — AB (ref 3.87–5.11)
RDW: 15.7 % — AB (ref 11.5–15.5)
WBC: 6.5 10*3/uL (ref 4.0–10.5)

## 2018-02-15 LAB — COMPREHENSIVE METABOLIC PANEL
ALT: 12 U/L (ref 0–44)
AST: 24 U/L (ref 15–41)
Albumin: 1.8 g/dL — ABNORMAL LOW (ref 3.5–5.0)
Alkaline Phosphatase: 94 U/L (ref 38–126)
Anion gap: 7 (ref 5–15)
BUN: 31 mg/dL — ABNORMAL HIGH (ref 6–20)
CALCIUM: 7.4 mg/dL — AB (ref 8.9–10.3)
CO2: 34 mmol/L — ABNORMAL HIGH (ref 22–32)
CREATININE: 1.03 mg/dL — AB (ref 0.44–1.00)
Chloride: 99 mmol/L (ref 98–111)
Glucose, Bld: 134 mg/dL — ABNORMAL HIGH (ref 70–99)
Potassium: 3.5 mmol/L (ref 3.5–5.1)
Sodium: 140 mmol/L (ref 135–145)
Total Bilirubin: 0.7 mg/dL (ref 0.3–1.2)
Total Protein: 5.1 g/dL — ABNORMAL LOW (ref 6.5–8.1)

## 2018-02-15 LAB — HEMOGLOBIN AND HEMATOCRIT, BLOOD
HEMATOCRIT: 37.2 % (ref 36.0–46.0)
HEMOGLOBIN: 11.8 g/dL — AB (ref 12.0–15.0)

## 2018-02-15 LAB — PREPARE RBC (CROSSMATCH)

## 2018-02-15 LAB — VANCOMYCIN, TROUGH: VANCOMYCIN TR: 25 ug/mL — AB (ref 15–20)

## 2018-02-15 LAB — MAGNESIUM: Magnesium: 2.2 mg/dL (ref 1.7–2.4)

## 2018-02-15 MED ORDER — IOPAMIDOL (ISOVUE-300) INJECTION 61%
100.0000 mL | Freq: Once | INTRAVENOUS | Status: AC | PRN
  Administered 2018-02-15: 100 mL via INTRAVENOUS

## 2018-02-15 MED ORDER — METHYLPREDNISOLONE SODIUM SUCC 40 MG IJ SOLR
40.0000 mg | Freq: Every day | INTRAMUSCULAR | Status: DC
  Administered 2018-02-15 – 2018-02-16 (×2): 40 mg via INTRAVENOUS
  Filled 2018-02-15 (×2): qty 1

## 2018-02-15 MED ORDER — IOPAMIDOL (ISOVUE-300) INJECTION 61%
30.0000 mL | Freq: Once | INTRAVENOUS | Status: AC | PRN
  Administered 2018-02-15: 30 mL via ORAL

## 2018-02-15 MED ORDER — VANCOMYCIN HCL IN DEXTROSE 750-5 MG/150ML-% IV SOLN
750.0000 mg | INTRAVENOUS | Status: DC
  Administered 2018-02-15 – 2018-02-16 (×2): 750 mg via INTRAVENOUS
  Filled 2018-02-15 (×4): qty 150

## 2018-02-15 MED ORDER — SODIUM CHLORIDE 0.9% IV SOLUTION
Freq: Once | INTRAVENOUS | Status: AC
  Administered 2018-02-15: 16:00:00 via INTRAVENOUS

## 2018-02-15 NOTE — Progress Notes (Signed)
CRITICAL VALUE ALERT  Critical Value:  Vanc trough 25  Date & Time Notied:  02/15/18 5188  Provider Notified: Dr. Olevia Bowens  Orders Received/Actions taken:

## 2018-02-15 NOTE — Consult Note (Signed)
Consultation Note Date: 02/15/2018   Patient Name: Sarah [name]  DOB: 1965/06/23  MRN: [id number]  Age / Sex: 52 y.o., female  PCP: [name], MD Referring Physician: Murlean Iba, MD  Reason for Consultation: Establishing goals of care and Psychosocial/spiritual support  HPI/Patient Profile: 52 y.o. 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   Clinical Assessment and Goals of Care: Sarah [name] is is resting quietly in bed, She will make and briefly keep eye contact. We talk about her acute and chronic health problems. At this point Sarah [name] would like to hear from her trusted oncologist before deciding what is next.  Sarah [name] states she would take brain surgery and or radiation if needed.   Sarah [name] tells me that she will agree to [facility] rehab, but also states she would like to go to her sister's home.  Call to sister Jimmye Norman at [phone]. We plan for a family meeting tomorrow at 77.  Filomena Jungling has lived with [name] for last four years.  Parent states that she and Judianne's mother have been encouraging Meia to turn cough deep breathe, use incentive spirometer, but they feel that Konstance is becoming weaker.  They share that at this point it is not getting better or worse, it is living or dying.  Conversation with hospitalist related to patient condition, goals of care, CODE STATUS discussions.  HCPOA  NEXT OF KIN - mother and sister   SUMMARY OF RECOMMENDATIONS   Continue full scope/full code Would take brain surgery if needed Would take radiation if needed Would like to follow-up with oncologist  Code Status/Advance Care  Planning:  Full code, she would take all measures to extend life  Symptom Management:   Per hospitalist, no additional needs at this time.  Palliative Prophylaxis:   Aspiration and Turn Reposition  Additional Recommendations (Limitations, Scope, Preferences):  Full Scope Treatment  Psycho-social/Spiritual:   Desire for further Chaplaincy support:no  Additional Recommendations: Caregiving  Support/Resources and Education on Hospice  Prognosis:   Unable to determine, based on outcomes.   Discharge Planning: Accepted into local rehab, [facility]      Primary Diagnoses: Present on Admission: . Pressure injury of skin . Anemia . Fecal impaction ([place]) . HCAP (healthcare-associated pneumonia) . Hurthle cell neoplasm of thyroid . Constipation . Constipation due to neurogenic bowel . Paraplegia at T4 level ([place]) . Closed fracture of left distal femur ([place])   I have reviewed the medical record, interviewed the patient and family, and examined the patient. The following aspects are pertinent.  Past Medical History:  Diagnosis Date  . Cancer [facility])    FNA of thyroid positive for onconytic/hurthle cell carcinoma  . Chronic back pain   . Complication of anesthesia    Nausea and vomiting  . DDD (degenerative disc disease),  cervical   . DJD (degenerative joint disease)   . Family history of adverse reaction to anesthesia    MOTHER HAS NAUSEA   . GERD (gastroesophageal reflux disease)   . History of rectal fissure   . HIT (heparin-induced thrombocytopenia) ([place])   . Hypertension   . Hypothyroidism   . Obesity   . Paraplegia at T4 level ([place])   . Pulmonary embolus, right ([place]) 2015  . Rotator cuff tendonitis right   Social History   Socioeconomic History  . Marital status: Single    Spouse name: Not on file  . Number of children: Not on file  . Years of education: Not on file  . Highest education level: Not on file  Occupational History  . Not on file  Social  Needs  . Financial resource strain: Not on file  . Food insecurity:    Worry: Not on file    Inability: Not on file  . Transportation needs:    Medical: Not on file    Non-medical: Not on file  Tobacco Use  . Smoking status: Never Smoker  . Smokeless tobacco: Never Used  Substance and Sexual Activity  . Alcohol use: No  . Drug use: No  . Sexual activity: Not Currently  Lifestyle  . Physical activity:    Days per week: Not on file    Minutes per session: Not on file  . Stress: Not on file  Relationships  . Social connections:    Talks on phone: Not on file    Gets together: Not on file    Attends religious service: Not on file    Active member of club or organization: Not on file    Attends meetings of clubs or organizations: Not on file    Relationship status: Not on file  Other Topics Concern  . Not on file  Social History Narrative  . Not on file   Family History  Problem Relation Age of Onset  . Hypertension Mother   . Diabetes Mother   . Hypertension Father   . Stroke Father   . Diabetes Father   . Diabetes Sister   . Diabetes Sister    Scheduled Meds: . sodium chloride   Intravenous Once  . sodium chloride   Intravenous Once  . sodium chloride   Intravenous Once  . acetylcysteine  4 mL Nebulization BID  . Chlorhexidine Gluconate Cloth  6 each Topical Daily  . famotidine  20 mg Oral BID  . feeding supplement (ENSURE ENLIVE)  237 mL Oral TID BM  . feeding supplement (PRO-STAT SUGAR FREE 64)  30 mL Oral TID BM  . furosemide  40 mg Intravenous Daily  . guaiFENesin  1,200 mg Oral BID  . ipratropium-albuterol  3 mL Nebulization TID  . levothyroxine  150 mcg Oral Q0600  . loratadine  10 mg Oral Daily  . mouth rinse  15 mL Mouth Rinse BID  . methylPREDNISolone (SOLU-MEDROL) injection  40 mg Intravenous Daily  . multivitamin with minerals  1 tablet Oral Daily  . pantoprazole  40 mg Oral Daily  . polyethylene glycol  17 g Oral Daily  . potassium chloride  40  mEq Oral Daily  . senna  1 tablet Oral BID  . sertraline  50 mg Oral Daily   Continuous Infusions: . ceFEPime (MAXIPIME) IV 2 g (02/15/18 1313)  . [START ON 02/16/2018] vancomycin     PRN Meds:.acetaminophen, albuterol, ALPRAZolam, baclofen, dextromethorphan, ondansetron **OR** ondansetron (ZOFRAN) IV, oxyCODONE, promethazine,  sodium chloride flush, sodium phosphate, tiZANidine Medications Prior to Admission:  Prior to Admission medications   Medication Sig Start Date End Date Taking? Authorizing Provider  acetaminophen (TYLENOL) 325 MG tablet Take 2 tablets (650 mg total) by mouth every 4 (four) hours as needed for mild pain ((score 1 to 3) or temp > 100.5). 09/11/17  Yes Angiulli, Lavon Paganini, PA-C  baclofen (LIORESAL) 10 MG tablet Take 25 mg by mouth 4 (four) times daily as needed for muscle spasms.  01/05/18  Yes [provider]  famotidine (PEPCID) 20 MG tablet Take 1 tablet (20 mg total) by mouth 2 (two) times daily. 09/21/17  Yes Emokpae, Courage, MD  fluticasone (FLONASE) 50 MCG/ACT nasal spray Place 2 sprays into both nostrils daily. 04/01/17  Yes Carlyle Dolly, MD  levothyroxine (SYNTHROID, LEVOTHROID) 150 MCG tablet Take 150 mcg by mouth daily before breakfast.  01/06/18  Yes [provider]  loratadine (CLARITIN) 10 MG tablet Take 1 tablet (10 mg total) by mouth daily. 10/30/17  Yes Angiulli, Lavon Paganini, PA-C  ondansetron (ZOFRAN) 4 MG tablet Take 1 tablet by mouth every 6 (six) hours as needed for nausea or vomiting.  07/25/14  Yes [provider]  oxyCODONE (ROXICODONE) 5 MG immediate release tablet Take 1 tablet (5 mg total) by mouth every 4 (four) hours as needed. 01/25/18 01/25/19 Yes Virl Cagey, MD  oxyCODONE-acetaminophen (PERCOCET/ROXICET) 5-325 MG tablet Take 0.5-1 tablets by mouth every 6 (six) hours as needed for severe pain.   Yes [provider]  pantoprazole (PROTONIX) 40 MG tablet Take 1 tablet by mouth daily. 01/17/15  Yes [provider]  polyethylene glycol (MIRALAX / GLYCOLAX) packet Take 17 g by mouth daily. 10/30/17  Yes Angiulli, Lavon Paganini, PA-C  rivaroxaban (XARELTO) 20 MG TABS tablet Take 20 mg by mouth daily with supper.   Yes [provider]  senna (SENOKOT) 8.6 MG TABS tablet Take 1 tablet (8.6 mg total) by mouth 2 (two) times daily. 10/29/17  Yes Angiulli, Lavon Paganini, PA-C  sertraline (ZOLOFT) 50 MG tablet  12/24/17  Yes [provider]  sucralfate (CARAFATE) 1 g tablet Take 1 tablet (1 g total) by mouth 4 (four) times daily -  with meals and at bedtime. Please dissolve tablet in 10-15 mL of water to create a slurry. Patient should drink 10-15 minutes before each meals and at bedtime to reduce painful swallowing. 11/13/17  Yes Tyler Pita, MD  tiZANidine (ZANAFLEX) 2 MG tablet Take 2 mg by mouth every 8 (eight) hours as needed for muscle spasms.  01/06/18  Yes [provider]  sodium phosphate (FLEET) 7-19 GM/118ML ENEM Place 133 mLs (1 enema total) rectally daily as needed for severe constipation. Patient not taking: Reported on 01/05/2018 10/29/17   Angiulli, Lavon Paganini, PA-C  sucralfate (CARAFATE) 1 GM/10ML suspension Take 10 mLs (1 g total) by mouth 4 (four) times daily -  with meals and at bedtime. Patient not taking: Reported on 02/03/2018 10/29/17   Cathlyn Parsons, PA-C   Allergies  Allergen Reactions  . Bee Venom Anaphylaxis  . Keflex [Cephalexin] Nausea And Vomiting  . Tramadol Nausea And Vomiting  . Gabapentin Nausea And Vomiting  . Heparin Other (See Comments)    Weak positive platelets induced antibodies, SRA negative--2015  . Hydrochlorothiazide Nausea And Vomiting  . Hydrocodone Nausea And Vomiting  . Oxycodone-Acetaminophen Nausea And Vomiting  . Sorbitol Nausea Only    And abdominal distress  . Tizanidine  Fatigue    Review of Systems  Unable to perform ROS: Other    Physical Exam  Constitutional: She is oriented to person, place, and time. No  distress.  Appears weak and frail, makes and mostly keeps eye contact  HENT:  Head: Atraumatic.  Cardiovascular: Normal rate.  Pulmonary/Chest: Effort normal.  Poor cough effort  Abdominal: Soft. She exhibits no distension.  Musculoskeletal: She exhibits no edema.  Neurological: She is alert and oriented to person, place, and time.  Skin: Skin is warm and dry.  Psychiatric:  Calm and cooperative  Nursing note and vitals reviewed.   Vital Signs: BP 135/80 (BP Location: Left Arm)   Pulse 72   Temp 98.5 F (36.9 C) (Oral)   Resp 19   Ht 5\' 11"  (1.803 m)   Wt 110.1 kg   LMP 10/22/2016 (LMP Unknown)   SpO2 100%   BMI 33.85 kg/m  Pain Scale: 0-10 POSS *See Group Information*: 1-Acceptable,Awake and alert Pain Score: 0-No pain   SpO2: SpO2: 100 % O2 Device:SpO2: 100 % O2 Flow Rate: .O2 Flow Rate (L/min): 4 L/min  IO: Intake/output summary:   Intake/Output Summary (Last 24 hours) at 02/15/2018 1449 Last data filed at 02/15/2018 1343 Gross per 24 hour  Intake 1197 ml  Output 800 ml  Net 397 ml    LBM: Last BM Date: 02/14/18 Baseline Weight: Weight: 99.8 kg Most recent weight: Weight: 110.1 kg     Palliative Assessment/Data:   Flowsheet Rows     Most Recent Value  Intake Tab  Referral Department  Hospitalist  Unit at Time of Referral  Med/Surg Unit  Palliative Care Primary Diagnosis  Cancer  Date Notified  02/11/18  Palliative Care Type  New Palliative care  Reason for referral  Clarify Goals of Care  Date of Admission  02/03/18  Date first seen by Palliative Care  02/15/18  # of days Palliative referral response time  4 Day(s)  # of days IP prior to Palliative referral  8  Clinical Assessment  Palliative Performance Scale Score  20%  Pain Max last 24 hours  Not able to report  Pain Min Last 24 hours  Not able to report  Dyspnea Max Last 24 Hours  Not able to report  Dyspnea Min Last 24 hours  Not able to report  Psychosocial & Spiritual Assessment    Palliative Care Outcomes  Patient/Family meeting held?  Yes  Who was at the meeting?  Patient at bedside  Palliative Care Outcomes  Clarified goals of care, Provided psychosocial or spiritual support      Time In: 1425 Time Out: 1515 Time Total: 50 Greater than 50%  of this time was spent counseling and coordinating care related to the above assessment and plan.  Signed by: Drue Novel, NP   Please contact Palliative Medicine Team phone at [phone] for questions and concerns.  For individual provider: See Shea Evans

## 2018-02-15 NOTE — Progress Notes (Signed)
02/15/2018 11:32 AM  I spoke with patient's oncologist [name] and updated him on CT brain findings and hospital course per patient's request.   Murvin Natal MD

## 2018-02-15 NOTE — Progress Notes (Signed)
Pharmacy Antibiotic Note  Sarah [name] is a 52 y.o. female admitted on 02/03/2018  With healthcare associate pneumonia.  Pharmacy has been consulted for vancomycin and cefepime dosing. Repeat Vancomycin trough elevated at 25, expected to fall between 15-20 mcg/mL this evening.  Will restart Vanco tonight at adjusted dose.  Plan: Continue cefepime 2gm IV q8h- assess duration of therapy Vancomycin 750 mg IV every 24 hours. F/u cxs, clinical progress, and Vanco trough as indicated.   Kel 0.02 hr-1  T1/2 34.6 hrs 750mg  IV 24 hrs will give: Predicted PK  Peak 25.3 mcg/mL  Trough 16 mcg/mL  (goal 15-20 mcg/mL)  AUC:MIC 487 mcg*hr/mL     Height: 5\' 11"  (180.3 cm) Weight: 242 lb 11.6 oz (110.1 kg) IBW/kg (Calculated) : 70.8  Temp (24hrs), Avg:98.3 F (36.8 C), Min:97.8 F (36.6 C), Max:98.6 F (37 C)  Recent Labs  Lab 02/11/18 0359 02/12/18 0427 02/13/18 0711 02/14/18 0644 02/14/18 0949 02/15/18 0620  WBC 7.4 8.2 6.7 6.3  --  6.5  CREATININE 1.18* 1.17* 1.19* 1.09*  --  1.03*  VANCOTROUGH  --   --   --   --  38* 25*    Estimated Creatinine Clearance: 88.2 mL/min (A) (by C-G formula based on SCr of 1.03 mg/dL (H)).    Allergies  Allergen Reactions  . Bee Venom Anaphylaxis  . Keflex [Cephalexin] Nausea And Vomiting  . Tramadol Nausea And Vomiting  . Gabapentin Nausea And Vomiting  . Heparin Other (See Comments)    Weak positive platelets induced antibodies, SRA negative--2015  . Hydrochlorothiazide Nausea And Vomiting  . Hydrocodone Nausea And Vomiting  . Oxycodone-Acetaminophen Nausea And Vomiting  . Sorbitol Nausea Only    And abdominal distress  . Tizanidine     Fatigue    Anti-infectives (From admission, onward)   Start     Dose/Rate Route Frequency Ordered Stop   02/13/18 0000  vancomycin (VANCOCIN) IVPB 750 mg/150 ml premix  Status:  Discontinued     750 mg 150 mL/hr over 60 Minutes Intravenous Every 12 hours 02/12/18 0910 02/14/18 1056   02/12/18 2200   ceFEPIme (MAXIPIME) 2 g in sodium chloride 0.9 % 100 mL IVPB     2 g 200 mL/hr over 30 Minutes Intravenous Every 8 hours 02/12/18 1336 02/16/18 1959   02/12/18 1000  vancomycin (VANCOCIN) 2,000 mg in sodium chloride 0.9 % 500 mL IVPB  Status:  Discontinued     2,000 mg 250 mL/hr over 120 Minutes Intravenous  Once 02/12/18 0853 02/12/18 0854   02/12/18 1000  vancomycin (VANCOCIN) 1,500 mg in sodium chloride 0.9 % 500 mL IVPB     1,500 mg 250 mL/hr over 120 Minutes Intravenous  Once 02/12/18 0854 02/12/18 1241   02/11/18 1145  ceFEPIme (MAXIPIME) 2 g in sodium chloride 0.9 % 100 mL IVPB     2 g 200 mL/hr over 30 Minutes Intravenous Every 8 hours 02/11/18 1146 02/12/18 1401   02/09/18 1200  ceFEPIme (MAXIPIME) 2 g in sodium chloride 0.9 % 100 mL IVPB  Status:  Discontinued     2 g 200 mL/hr over 30 Minutes Intravenous Every 8 hours 02/09/18 1031 02/11/18 1146   02/06/18 1900  vancomycin (VANCOCIN) 1,500 mg in sodium chloride 0.9 % 500 mL IVPB  Status:  Discontinued     1,500 mg 250 mL/hr over 120 Minutes Intravenous Every 12 hours 02/06/18 0827 02/09/18 1019   02/06/18 0830  vancomycin (VANCOCIN) 2,000 mg in sodium chloride 0.9 % 500 mL  IVPB     2,000 mg 250 mL/hr over 120 Minutes Intravenous  Once 02/06/18 0826 02/06/18 1237   02/06/18 0830  ceFEPIme (MAXIPIME) 2 g in sodium chloride 0.9 % 100 mL IVPB  Status:  Discontinued     2 g 200 mL/hr over 30 Minutes Intravenous Every 8 hours 02/06/18 0826 02/09/18 1031   02/05/18 1500  cefTRIAXone (ROCEPHIN) 1 g in sodium chloride 0.9 % 100 mL IVPB  Status:  Discontinued     1 g 200 mL/hr over 30 Minutes Intravenous Every 24 hours 02/04/18 0412 02/04/18 1421   02/04/18 2200  cefTRIAXone (ROCEPHIN) 1 g in sodium chloride 0.9 % 100 mL IVPB  Status:  Discontinued     1 g 200 mL/hr over 30 Minutes Intravenous Every 24 hours 02/04/18 1421 02/06/18 0642   02/04/18 1300  azithromycin (ZITHROMAX) 500 mg in sodium chloride 0.9 % 250 mL IVPB  Status:   Discontinued     500 mg 250 mL/hr over 60 Minutes Intravenous Every 24 hours 02/04/18 0412 02/06/18 0642   02/03/18 2215  cefTRIAXone (ROCEPHIN) 1 g in sodium chloride 0.9 % 100 mL IVPB     1 g 200 mL/hr over 30 Minutes Intravenous  Once 02/03/18 2214 02/03/18 2322   02/03/18 2215  azithromycin (ZITHROMAX) 500 mg in sodium chloride 0.9 % 250 mL IVPB     500 mg 250 mL/hr over 60 Minutes Intravenous  Once 02/03/18 2214 02/04/18 0024      Antimicrobials this admission: 11/13 azithromycin >> 11/14 11/13 rocephin >> 11/15 11/16 cefepime >> 11/16 vancomycin >> 11/19 Vanco 11/22 >>   Microbiology results: 11/13 BCx: ng 11/14 MRSA PCR: positive  Thank you for allowing pharmacy to be a part of this patient's care.  Ramond Craver 02/15/2018 12:58 PM

## 2018-02-15 NOTE — Clinical Social Work Note (Signed)
CSW following. Pt discussed in Progression this AM. Per MD, pt with new brain mets seen on CT. Palliative Care consult pending to determine goals of care. At this time, patient is accepted at [facility] and they are working on Ship broker. Will follow and assist with disposition as needs are determined.

## 2018-02-15 NOTE — Progress Notes (Signed)
Initial visit offering emotional and spiritual support. Will follow up.

## 2018-02-15 NOTE — Progress Notes (Addendum)
PROGRESS NOTE  Sarah [name]  AOZ:[id number]  DOB: 11/21/1965  DOA: 02/03/2018 PCP: [name], MD   Brief Admission Hx: 52 y.o. 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  Patient states that symptoms have been going on for past 3 days.  She also complains of nausea but no vomiting.  Denies any fever.  Denies chest pain.  Pt was admitted with a multifocal pneumonia.  Newly discovered brain mets seen on CT brain.   MDM/Assessment & Plan:   1. Healthcare associated pneumonia-chest x-ray shows masslike opacity, likely infectious as it developed quickly since December 13, 2017.  CT confirms a multifocal pneumonia.  Pt was initially started on ceftriaxone and Zithromax but given the severity of infection and recent postop status, coverage was broadened to cefepime/vancomycin.  MRSA (+).   Blood cultures NGTD.  Strep pneumo and legionella antigens negative.  Progress will be slow since she has a very weak cough reflex and poor inspiratory effort.  Continue mucomyst nebs. Continue steroids.  Added chest physiotherapy with percussion.  CXR 11/22 shows progression, and added back vancomycin, continue cefepime.  IV lasix ordered. Echo with normal EF.  Palliative care discussions started over the weekend and the family should be meeting with the palliative care team this week for further goals of care discussions.   2. History of pulmonary embolism-continue Xarelto for full anticoagulation.  3. 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 Pt has been treated with radiation and chemotherapy.  Pt has been followed by Neurosurgeon Dr. Christella Noa and oncologist [name].  4. Newly discovered Brain Mets - Pt's CT head shows  metastases to brain with dural involvement, MRI recommended, unsure if patient could tolerate MRI. Will try to touch basis with her oncologist today if he is in the office 11/25.  Palliative medicine consult pending.   4. Hypothyroidism-continue Synthroid 5. Hypotension - improved, soft blood pressures likely multifactorial from dehydration, infection, anemia, paraplegia.  Pt is asymptomatic and MAP>65.    6. Anemia of chronic disease - Transfused 2 units PRBC 11/15 with improvement of hemoglobin.  Appreciate GI consult and assistance.  No gross GI bleeding. CT abd/pelvis negative for retroperitoneal bleed or hematoma.  Hg up to 9 after transfusion.  Hg on 11/25 back down to 7.4 and patient is symptomatic. GI has seen and don't plan for endoscopy at this time given patient's current condition, will transfuse 2 units PRBC 11/25. Repeat CBC in AM.  Pt has recently tested hemoccult negative during this admission and seen by GI.  Will temporarily hold xarelto until goals of care discussions and we can further investigate for any source of bleeding.     7. Hypokalemia -replaced.  Magnesium is normal. 8. Stage 2 sacral pressure injury and stage 1 left heel injury - improving, continue wound care protocol. Consulted WOC nurse.  9. Fecal impaction - Resolved.  Was seen on CT abdomen/pelvis -feels better after receiving enemas.  Continue daily laxatives.  10. Anasarca.  Related to hypoalbuminemia.  Started on Lasix.  Patient has had good urine output.  Dietitian consulted to assist with protein supplementation.  11. Pulmonary edema seen on CXR 11/22.  IV lasix ordered.  Echocardiogram with normal EF and no diastolic dysfunction. No CHF suspected.     DVT  Prophylaxis-  Xarelto on temporary hold 11/25 due to anemia, SCDs ordered for 11/25    Family Communication:  Discussed with family that patient appears to be dying and I strongly advised them to meet with the palliative care team to help [place] plan on code  status.    Code Status:  Full code, palliative care consulted to help address further goals of care.  Family requesting meeting on 11/26 for goals of care.   Consultants:  GI  Palliative care (unable to see patient until 11/25) Family requesting goals of care meeting for 11/26  Antimicrobials:  Ceftriaxone 11/14 >11/16  Azithromycin 11/14>11/16  Vancomycin 11/16 >11/19, 11/22-  Cefepime 11/16 >  Pressure Injury Documentation: Pressure Injury 02/04/18 Stage II -  Partial thickness loss of dermis presenting as a shallow open ulcer with a red, pink wound bed without slough. (Active)  02/04/18 0130   Location: Sacrum  Location Orientation: Mid  Staging: Stage II -  Partial thickness loss of dermis presenting as a shallow open ulcer with a red, pink wound bed without slough.  Wound Description (Comments):   Present on Admission: Yes     Pressure Injury 02/04/18 Stage I -  Intact skin with non-blanchable redness of a localized area usually over a bony prominence. (Active)  02/04/18 0120   Location: Heel  Location Orientation: Left  Staging: Stage I -  Intact skin with non-blanchable redness of a localized area usually over a bony prominence.  Wound Description (Comments):   Present on Admission: Yes   Echocardiogram 02/12/18 Study Conclusions  - Left ventricle: The cavity size was normal. Wall thickness was   increased in a pattern of mild LVH. Systolic function was normal.   The estimated ejection fraction was in the range of 55% to 60%.   Wall motion was normal; there were no regional wall motion   abnormalities. Left ventricular diastolic function parameters   were normal. - Aortic valve: Valve area (Vmax): 1.78 cm^2. - Atrial septum: No defect or patent foramen ovale was identified.  Subjective: Pt says that she she feels cold and tired.      Objective: Vitals:   02/14/18 2014 02/14/18 2301 02/15/18 0528 02/15/18 0813  BP:  116/77 (!) 144/70   Pulse: 76 72 (!)  58   Resp: 20 19 19    Temp:  98.2 F (36.8 C) 97.8 F (36.6 C)   TempSrc:  Oral Oral   SpO2: 97% 96% 96% 96%  Weight:      Height:        Intake/Output Summary (Last 24 hours) at 02/15/2018 0839 Last data filed at 02/14/2018 1700 Gross per 24 hour  Intake 1200 ml  Output -  Net 1200 ml   Filed Weights   02/08/18 0500 02/10/18 0427 02/11/18 0500  Weight: 111.8 kg 111 kg 110.1 kg   REVIEW OF SYSTEMS  As per history otherwise all reviewed and reported negative  Exam:  General exam: chronically ill appearing female, Alert, awake, oriented x 3, appears pale and weak Respiratory system: coarse anterior congestion, rales unchanged. No increased WOB. Cardiovascular system:normal s1,s2 sounds. No murmurs, rubs, gallops. Gastrointestinal system: Abdomen is nondistended, soft and nontender. No organomegaly or masses felt. Normal bowel sounds heard. Extremities: 1+ edema bilateral LEs unchanged.  Skin: No rashes, lesions or ulcers.  Neuro: paraplegic.   Psychiatry: anxious appearing.    Data Reviewed: Basic Metabolic Panel: Recent Labs  Lab 02/11/18 0359 02/12/18 0427 02/13/18 0711 02/14/18 0644 02/15/18 0620  NA 139  139 139 139 140  K 4.2 4.1 3.7 3.6 3.5  CL 108 106 104 101 99  CO2 26 27 30  32 34*  GLUCOSE 144* 127* 136* 119* 134*  BUN 27* 30* 31* 31* 31*  CREATININE 1.18* 1.17* 1.19* 1.09* 1.03*  CALCIUM 7.7* 7.8* 7.5* 7.6* 7.4*  MG  --  2.4 2.4 2.3 2.2  PHOS  --  3.3  --   --   --    Liver Function Tests: Recent Labs  Lab 02/10/18 0408 02/12/18 0427 02/13/18 0711 02/14/18 0644 02/15/18 0620  AST 15 27 28 28 24   ALT 8 12 12 14 12   ALKPHOS 120 106 97 96 94  BILITOT 0.7 0.5 0.9 0.8 0.7  PROT 5.2* 5.4* 5.3* 5.4* 5.1*  ALBUMIN 1.6* 1.7* 1.7* 1.8* 1.8*   No results for input(s): LIPASE, AMYLASE in the last 168 hours. No results for input(s): AMMONIA in the last 168 hours. CBC: Recent Labs  Lab 02/10/18 0408 02/11/18 0359 02/12/18 0427 02/13/18 0711  02/14/18 0644 02/15/18 0620  WBC 7.9 7.4 8.2 6.7 6.3 6.5  NEUTROABS 6.9 6.4  --  5.7 5.4 5.8  HGB 9.5* 9.3* 9.7* 9.4* 9.3* 7.4*  HCT 30.8* 30.2* 32.0* 30.9* 30.5* 23.8*  MCV 91.7 91.0 92.8 90.9 89.7 90.2  PLT 124* 145* 159 129* 124* 98*   Cardiac Enzymes: No results for input(s): CKTOTAL, CKMB, CKMBINDEX, TROPONINI in the last 168 hours. CBG (last 3)  No results for input(s): GLUCAP in the last 72 hours. No results found for this or any previous visit (from the past 240 hour(s)).   Studies: Ct Head W & Wo Contrast  Result Date: 02/13/2018 CLINICAL DATA:  Altered level of consciousness for a few weeks. History of metastatic thyroid cancer. EXAM: CT HEAD WITHOUT AND WITH CONTRAST TECHNIQUE: Contiguous axial images were obtained from the base of the skull through the vertex without and with intravenous contrast CONTRAST:  39mL OMNIPAQUE IOHEXOL 300 MG/ML  SOLN COMPARISON:  CT HEAD September 02, 2017 FINDINGS: BRAIN: No intraparenchymal hemorrhage, mass effect nor midline shift. Borderline parenchymal brain volume loss for age. No hydrocephalus. No acute large vascular territory infarcts. No abnormal extra-axial fluid collections. Basal cisterns are patent. VASCULAR: Mild calcific atherosclerosis carotid siphon. SKULL/SOFT TISSUES: No skull fracture. 19 mm more conspicuous lytic lesion LEFT parietal calvarium (diploic space to inner table) with slight underlying dural enhancement. Small RIGHT frontal scalp lipoma. No significant soft tissue swelling. ORBITS/SINUSES: The included ocular globes and orbital contents are normal.Mild paranasal sinus mucosal thickening. Trace RIGHT mastoid effusion. OTHER: None. IMPRESSION: 1. No acute intracranial process. 2. 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 Recommend MRI of the brain with and without contrast. 3. Borderline parenchymal brain volume loss for age. 4. These results will be called to the ordering clinician or representative  by the professional radiologist assistant, and communication documented in [facility] Dashboard. Electronically Signed   By: Elon Alas M.D.   On: 02/13/2018 18:13   Scheduled Meds: . sodium chloride   Intravenous Once  . sodium chloride   Intravenous Once  . sodium chloride   Intravenous Once  . acetylcysteine  4 mL Nebulization BID  . Chlorhexidine Gluconate Cloth  6 each Topical Daily  . famotidine  20 mg Oral BID  . feeding supplement (ENSURE ENLIVE)  237 mL Oral TID BM  . feeding supplement (PRO-STAT SUGAR FREE 64)  30 mL Oral TID BM  . furosemide  40 mg Intravenous Daily  .  guaiFENesin  1,200 mg Oral BID  . ipratropium-albuterol  3 mL Nebulization TID  . levothyroxine  150 mcg Oral Q0600  . loratadine  10 mg Oral Daily  . mouth rinse  15 mL Mouth Rinse BID  . methylPREDNISolone (SOLU-MEDROL) injection  40 mg Intravenous Daily  . multivitamin with minerals  1 tablet Oral Daily  . pantoprazole  40 mg Oral Daily  . polyethylene glycol  17 g Oral Daily  . potassium chloride  40 mEq Oral Daily  . rivaroxaban  20 mg Oral Q supper  . senna  1 tablet Oral BID  . sertraline  50 mg Oral Daily   Continuous Infusions: . 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

## 2018-02-15 NOTE — Progress Notes (Signed)
Subjective: She says she feels a little better.  She has no new complaints.  She is able to cough up some sputum now.  Objective: Vital signs in last 24 hours: Temp:  [97.8 F (36.6 C)-98.6 F (37 C)] 97.8 F (36.6 C) (11/25 0528) Pulse Rate:  [58-78] 58 (11/25 0528) Resp:  [18-20] 19 (11/25 0528) BP: (94-144)/(66-77) 144/70 (11/25 0528) SpO2:  [96 %-97 %] 96 % (11/25 0813) Weight change:  Last BM Date: 02/14/18  Intake/Output from previous day: 11/24 0701 - 11/25 0700 In: 1200 [P.O.:1200] Out: -   PHYSICAL EXAM General appearance: alert, cooperative and no distress Resp: She still has rhonchi and end expiratory wheezing but she is moving air better Cardio: regular rate and rhythm, S1, S2 normal, no murmur, click, rub or gallop GI: soft, non-tender; bowel sounds normal; no masses,  no organomegaly Extremities: She is paraplegic  Lab Results:  Results for orders placed or performed during the hospital encounter of 02/03/18 (from the past 48 hour(s))  Comprehensive metabolic panel     Status: Abnormal   Collection Time: 02/14/18  6:44 AM  Result Value Ref Range   Sodium 139 135 - 145 mmol/L   Potassium 3.6 3.5 - 5.1 mmol/L   Chloride 101 98 - 111 mmol/L   CO2 32 22 - 32 mmol/L   Glucose, Bld 119 (H) 70 - 99 mg/dL   BUN 31 (H) 6 - 20 mg/dL   Creatinine, Ser 1.09 (H) 0.44 - 1.00 mg/dL   Calcium 7.6 (L) 8.9 - 10.3 mg/dL   Total Protein 5.4 (L) 6.5 - 8.1 g/dL   Albumin 1.8 (L) 3.5 - 5.0 g/dL   AST 28 15 - 41 U/L   ALT 14 0 - 44 U/L   Alkaline Phosphatase 96 38 - 126 U/L   Total Bilirubin 0.8 0.3 - 1.2 mg/dL   GFR calc non Af Amer 58 (L) >60 mL/min   GFR calc Af Amer >60 >60 mL/min    Comment: (NOTE) The eGFR has been calculated using the CKD EPI equation. This calculation has not been validated in all clinical situations. eGFR's persistently <60 mL/min signify possible Chronic Kidney Disease.    Anion gap 6 5 - 15    Comment: Performed at [facility], [address], [place], [place] [address]  CBC with Differential/Platelet     Status: Abnormal   Collection Time: 02/14/18  6:44 AM  Result Value Ref Range   WBC 6.3 4.0 - 10.5 K/uL   RBC 3.40 (L) 3.87 - 5.11 MIL/uL   Hemoglobin 9.3 (L) 12.0 - 15.0 g/dL   HCT 30.5 (L) 36.0 - 46.0 %   MCV 89.7 80.0 - 100.0 fL   MCH 27.4 26.0 - 34.0 pg   MCHC 30.5 30.0 - 36.0 g/dL   RDW 15.9 (H) 11.5 - 15.5 %   Platelets 124 (L) 150 - 400 K/uL   nRBC 0.0 0.0 - 0.2 %   Neutrophils Relative % 86 %   Neutro Abs 5.4 1.7 - 7.7 K/uL   Lymphocytes Relative 8 %   Lymphs Abs 0.5 (L) 0.7 - 4.0 K/uL   Monocytes Relative 3 %   Monocytes Absolute 0.2 0.1 - 1.0 K/uL   Eosinophils Relative 0 %   Eosinophils Absolute 0.0 0.0 - 0.5 K/uL   Basophils Relative 0 %   Basophils Absolute 0.0 0.0 - 0.1 K/uL   Immature Granulocytes 3 %   Abs Immature Granulocytes 0.17 (H) 0.00 - 0.07 K/uL  Comment: Performed at [facility], [address], [place], [place] [address]  Magnesium     Status: None   Collection Time: 02/14/18  6:44 AM  Result Value Ref Range   Magnesium 2.3 1.7 - 2.4 mg/dL    Comment: Performed at [facility], [address] [place], [place] [address]  Vancomycin, trough     Status: Abnormal   Collection Time: 02/14/18  9:49 AM  Result Value Ref Range   Vancomycin Tr 38 (HH) 15 - 20 ug/mL    Comment: CRITICAL RESULT CALLED TO, READ BACK BY AND VERIFIED WITH: MARTIN,D@[id number]  BY MATTHEWS, B 11.24.19 Performed at [facility], [address], [place], [place] [address]   Comprehensive metabolic panel     Status: Abnormal   Collection Time: 02/15/18  6:20 AM  Result Value Ref Range   Sodium 140 135 - 145 mmol/L   Potassium 3.5 3.5 - 5.1 mmol/L   Chloride 99 98 - 111 mmol/L   CO2 34 (H) 22 - 32 mmol/L   Glucose, Bld 134 (H) 70 - 99 mg/dL   BUN 31 (H) 6 - 20 mg/dL   Creatinine, Ser 1.03 (H) 0.44 - 1.00 mg/dL   Calcium 7.4 (L) 8.9 - 10.3 mg/dL   Total Protein 5.1 (L) 6.5 - 8.1 g/dL   Albumin 1.8 (L) 3.5 - 5.0 g/dL    AST 24 15 - 41 U/L   ALT 12 0 - 44 U/L   Alkaline Phosphatase 94 38 - 126 U/L   Total Bilirubin 0.7 0.3 - 1.2 mg/dL   GFR calc non Af Amer >60 >60 mL/min   GFR calc Af Amer >60 >60 mL/min    Comment: (NOTE) The eGFR has been calculated using the CKD EPI equation. This calculation has not been validated in all clinical situations. eGFR's persistently <60 mL/min signify possible Chronic Kidney Disease.    Anion gap 7 5 - 15    Comment: Performed at [facility], [address] [place], [place] [address]  CBC with Differential/Platelet     Status: Abnormal   Collection Time: 02/15/18  6:20 AM  Result Value Ref Range   WBC 6.5 4.0 - 10.5 K/uL   RBC 2.64 (L) 3.87 - 5.11 MIL/uL   Hemoglobin 7.4 (L) 12.0 - 15.0 g/dL   HCT 23.8 (L) 36.0 - 46.0 %   MCV 90.2 80.0 - 100.0 fL   MCH 28.0 26.0 - 34.0 pg   MCHC 31.1 30.0 - 36.0 g/dL   RDW 15.7 (H) 11.5 - 15.5 %   Platelets 98 (L) 150 - 400 K/uL    Comment: Immature Platelet Fraction may be clinically indicated, consider ordering this additional test [id number]    nRBC 0.0 0.0 - 0.2 %   Neutrophils Relative % 89 %   Neutro Abs 5.8 1.7 - 7.7 K/uL   Lymphocytes Relative 7 %   Lymphs Abs 0.4 (L) 0.7 - 4.0 K/uL   Monocytes Relative 3 %   Monocytes Absolute 0.2 0.1 - 1.0 K/uL   Eosinophils Relative 0 %   Eosinophils Absolute 0.0 0.0 - 0.5 K/uL   Basophils Relative 0 %   Basophils Absolute 0.0 0.0 - 0.1 K/uL   Immature Granulocytes 1 %   Abs Immature Granulocytes 0.08 (H) 0.00 - 0.07 K/uL    Comment: Performed at [facility], [address] [place], [place] [address]  Magnesium     Status: None   Collection Time: 02/15/18  6:20 AM  Result Value Ref Range  Magnesium 2.2 1.7 - 2.4 mg/dL    Comment: Performed at [facility], [address], [place], [place] [address]  Vancomycin, trough     Status: Abnormal   Collection Time: 02/15/18  6:20 AM  Result Value Ref Range   Vancomycin Tr 25 (HH) 15 - 20 ug/mL    Comment: CRITICAL RESULT  CALLED TO, READ BACK BY AND VERIFIED WITH: RHEW,J AT 6:55AM ON 02/15/18 BY [facility] Performed at [facility], [address] [place], [place] [address]    *Note: Due to a large number of results and/or encounters for the requested time period, some results have not been displayed. A complete set of results can be found in Results Review.    ABGS No results for input(s): PHART, PO2ART, TCO2, HCO3 in the last 72 hours.  Invalid input(s): PCO2 CULTURES No results found for this or any previous visit (from the past 240 hour(s)). Studies/Results: Ct Head W & Wo Contrast  Result Date: 02/13/2018 CLINICAL DATA:  Altered level of consciousness for a few weeks. History of metastatic thyroid cancer. EXAM: CT HEAD WITHOUT AND WITH CONTRAST TECHNIQUE: Contiguous axial images were obtained from the base of the skull through the vertex without and with intravenous contrast CONTRAST:  3m OMNIPAQUE IOHEXOL 300 MG/ML  SOLN COMPARISON:  CT HEAD September 02, 2017 FINDINGS: BRAIN: No intraparenchymal hemorrhage, mass effect nor midline shift. Borderline parenchymal brain volume loss for age. No hydrocephalus. No acute large vascular territory infarcts. No abnormal extra-axial fluid collections. Basal cisterns are patent. VASCULAR: Mild calcific atherosclerosis carotid siphon. SKULL/SOFT TISSUES: No skull fracture. 19 mm more conspicuous lytic lesion LEFT parietal calvarium (diploic space to inner table) with slight underlying dural enhancement. Small RIGHT frontal scalp lipoma. No significant soft tissue swelling. ORBITS/SINUSES: The included ocular globes and orbital contents are normal.Mild paranasal sinus mucosal thickening. Trace RIGHT mastoid effusion. OTHER: None. IMPRESSION: 1. No acute intracranial process. 2. 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 Recommend MRI of the brain with and without contrast. 3. Borderline parenchymal brain volume loss for age. 4. These results will  be called to the ordering clinician or representative by the professional radiologist assistant, and communication documented in [facility] Dashboard. Electronically Signed   By: CElon AlasM.D.   On: 02/13/2018 18:13    Medications:  Prior to Admission:  Medications Prior to Admission  Medication Sig Dispense Refill Last Dose  . acetaminophen (TYLENOL) 325 MG tablet Take 2 tablets (650 mg total) by mouth every 4 (four) hours as needed for mild pain ((score 1 to 3) or temp > 100.5).   Past Month at Unknown time  . baclofen (LIORESAL) 10 MG tablet Take 25 mg by mouth 4 (four) times daily as needed for muscle spasms.    02/03/2018 at Unknown time  . famotidine (PEPCID) 20 MG tablet Take 1 tablet (20 mg total) by mouth 2 (two) times daily. 60 tablet 0 Past Week at Unknown time  . fluticasone (FLONASE) 50 MCG/ACT nasal spray Place 2 sprays into both nostrils daily. 16 g 6 02/02/2018 at Unknown time  . levothyroxine (SYNTHROID, LEVOTHROID) 150 MCG tablet Take 150 mcg by mouth daily before breakfast.    02/02/2018 at Unknown time  . loratadine (CLARITIN) 10 MG tablet Take 1 tablet (10 mg total) by mouth daily.   Past Week at Unknown time  . ondansetron (ZOFRAN) 4 MG tablet Take 1 tablet by mouth every 6 (six) hours as needed for nausea or vomiting.  02/02/2018 at Unknown time  . oxyCODONE (ROXICODONE) 5 MG immediate release tablet Take 1 tablet (5 mg total) by mouth every 4 (four) hours as needed. 20 tablet 0 unknown  . oxyCODONE-acetaminophen (PERCOCET/ROXICET) 5-325 MG tablet Take 0.5-1 tablets by mouth every 6 (six) hours as needed for severe pain.   02/03/2018 at Unknown time  . pantoprazole (PROTONIX) 40 MG tablet Take 1 tablet by mouth daily.   Past Week at Unknown time  . polyethylene glycol (MIRALAX / GLYCOLAX) packet Take 17 g by mouth daily. 14 each 0 Past Week at Unknown time  . rivaroxaban (XARELTO) 20 MG TABS tablet Take 20 mg by mouth daily with supper.   02/02/2018 at 1700  . senna  (SENOKOT) 8.6 MG TABS tablet Take 1 tablet (8.6 mg total) by mouth 2 (two) times daily. 120 each 0 Past Week at Unknown time  . sertraline (ZOLOFT) 50 MG tablet    02/02/2018 at Unknown time  . sucralfate (CARAFATE) 1 g tablet Take 1 tablet (1 g total) by mouth 4 (four) times daily -  with meals and at bedtime. Please dissolve tablet in 10-15 mL of water to create a slurry. Patient should drink 10-15 minutes before each meals and at bedtime to reduce painful swallowing. 120 tablet 2 02/02/2018 at Unknown time  . tiZANidine (ZANAFLEX) 2 MG tablet Take 2 mg by mouth every 8 (eight) hours as needed for muscle spasms.    Past Month at Unknown time  . sodium phosphate (FLEET) 7-19 GM/118ML ENEM Place 133 mLs (1 enema total) rectally daily as needed for severe constipation. (Patient not taking: Reported on 01/05/2018)  0 More than a month at Unknown time  . sucralfate (CARAFATE) 1 GM/10ML suspension Take 10 mLs (1 g total) by mouth 4 (four) times daily -  with meals and at bedtime. (Patient not taking: Reported on 02/03/2018) 420 mL 0 Not Taking at Unknown time   Scheduled: . sodium chloride   Intravenous Once  . sodium chloride   Intravenous Once  . sodium chloride   Intravenous Once  . acetylcysteine  4 mL Nebulization BID  . Chlorhexidine Gluconate Cloth  6 each Topical Daily  . famotidine  20 mg Oral BID  . feeding supplement (ENSURE ENLIVE)  237 mL Oral TID BM  . feeding supplement (PRO-STAT SUGAR FREE 64)  30 mL Oral TID BM  . furosemide  40 mg Intravenous Daily  . guaiFENesin  1,200 mg Oral BID  . ipratropium-albuterol  3 mL Nebulization TID  . levothyroxine  150 mcg Oral Q0600  . loratadine  10 mg Oral Daily  . mouth rinse  15 mL Mouth Rinse BID  . methylPREDNISolone (SOLU-MEDROL) injection  40 mg Intravenous Daily  . multivitamin with minerals  1 tablet Oral Daily  . pantoprazole  40 mg Oral Daily  . polyethylene glycol  17 g Oral Daily  . potassium chloride  40 mEq Oral Daily  .  rivaroxaban  20 mg Oral Q supper  . senna  1 tablet Oral BID  . sertraline  50 mg Oral Daily   Continuous: . ceFEPime (MAXIPIME) IV 2 g (02/15/18 0500)   OZY:YQMGNOIBBCWUG, albuterol, ALPRAZolam, baclofen, dextromethorphan, ondansetron **OR** ondansetron (ZOFRAN) IV, oxyCODONE, promethazine, sodium chloride flush, sodium phosphate, tiZANidine  Assesment: She was admitted with healthcare associated pneumonia.  She is known to have thyroid cancer with paraplegia related to bony metastatic disease.  She now has what appears to be brain metastatic disease as well.  She initially  had a lot of trouble moving her secretions probably related to her paralysis but she is moving them better now and sounds significantly improved from 2 to 3 days ago. 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

## 2018-02-16 ENCOUNTER — Inpatient Hospital Stay (HOSPITAL_COMMUNITY): Payer: Medicare Other

## 2018-02-16 DIAGNOSIS — R0600 Dyspnea, unspecified: Secondary | ICD-10-CM

## 2018-02-16 DIAGNOSIS — J189 Pneumonia, unspecified organism: Secondary | ICD-10-CM

## 2018-02-16 LAB — COMPREHENSIVE METABOLIC PANEL
ALT: 14 U/L (ref 0–44)
AST: 23 U/L (ref 15–41)
Albumin: 1.8 g/dL — ABNORMAL LOW (ref 3.5–5.0)
Alkaline Phosphatase: 106 U/L (ref 38–126)
Anion gap: 6 (ref 5–15)
BUN: 26 mg/dL — AB (ref 6–20)
CHLORIDE: 100 mmol/L (ref 98–111)
CO2: 34 mmol/L — AB (ref 22–32)
Calcium: 7.4 mg/dL — ABNORMAL LOW (ref 8.9–10.3)
Creatinine, Ser: 1.11 mg/dL — ABNORMAL HIGH (ref 0.44–1.00)
GFR calc Af Amer: >60 mL/min (ref >60)
GFR, EST NON AFRICAN AMERICAN: 56 mL/min — AB (ref >60)
Glucose, Bld: 85 mg/dL (ref 70–99)
POTASSIUM: 3.2 mmol/L — AB (ref 3.5–5.1)
SODIUM: 140 mmol/L (ref 135–145)
Total Bilirubin: 0.8 mg/dL (ref 0.3–1.2)
Total Protein: 5.1 g/dL — ABNORMAL LOW (ref 6.5–8.1)

## 2018-02-16 LAB — TYPE AND SCREEN
ABO/RH(D): O POS
ANTIBODY SCREEN: NEGATIVE
Unit division: 0
Unit division: 0

## 2018-02-16 LAB — CBC WITH DIFFERENTIAL/PLATELET
ABS IMMATURE GRANULOCYTES: 0.1 10*3/uL — AB (ref 0.00–0.07)
BASOS ABS: 0 10*3/uL (ref 0.0–0.1)
Basophils Relative: 0 %
Eosinophils Absolute: 0.3 10*3/uL (ref 0.0–0.5)
Eosinophils Relative: 4 %
HCT: 35.9 % — ABNORMAL LOW (ref 36.0–46.0)
HEMOGLOBIN: 11.4 g/dL — AB (ref 12.0–15.0)
Immature Granulocytes: 1 %
LYMPHS PCT: 11 %
Lymphs Abs: 0.8 10*3/uL (ref 0.7–4.0)
MCH: 28.4 pg (ref 26.0–34.0)
MCHC: 31.8 g/dL (ref 30.0–36.0)
MCV: 89.5 fL (ref 80.0–100.0)
MONO ABS: 0.3 10*3/uL (ref 0.1–1.0)
Monocytes Relative: 4 %
NEUTROS ABS: 5.7 10*3/uL (ref 1.7–7.7)
NRBC: 0 % (ref 0.0–0.2)
Neutrophils Relative %: 80 %
Platelets: 115 10*3/uL — ABNORMAL LOW (ref 150–400)
RBC: 4.01 MIL/uL (ref 3.87–5.11)
RDW: 15.7 % — ABNORMAL HIGH (ref 11.5–15.5)
WBC: 7.1 10*3/uL (ref 4.0–10.5)

## 2018-02-16 LAB — BPAM RBC
Blood Product Expiration Date: 201912202359
Blood Product Expiration Date: 201912252359
ISSUE DATE / TIME: 201911251226
ISSUE DATE / TIME: 201911251631
UNIT TYPE AND RH: 5100
Unit Type and Rh: 5100

## 2018-02-16 MED ORDER — POTASSIUM CHLORIDE CRYS ER 20 MEQ PO TBCR
40.0000 meq | EXTENDED_RELEASE_TABLET | Freq: Every day | ORAL | Status: DC
  Filled 2018-02-16: qty 2

## 2018-02-16 MED ORDER — METHYLPREDNISOLONE SODIUM SUCC 40 MG IJ SOLR
20.0000 mg | Freq: Every day | INTRAMUSCULAR | Status: DC
  Administered 2018-02-17 – 2018-02-19 (×3): 20 mg via INTRAVENOUS
  Filled 2018-02-16 (×3): qty 1

## 2018-02-16 MED ORDER — FUROSEMIDE 40 MG PO TABS
40.0000 mg | ORAL_TABLET | Freq: Every day | ORAL | Status: DC
  Administered 2018-02-17: 40 mg via ORAL
  Filled 2018-02-16: qty 1

## 2018-02-16 MED ORDER — RIVAROXABAN 20 MG PO TABS
20.0000 mg | ORAL_TABLET | Freq: Every day | ORAL | Status: DC
  Administered 2018-02-16 – 2018-02-23 (×8): 20 mg via ORAL
  Filled 2018-02-16 (×8): qty 1

## 2018-02-16 MED ORDER — POTASSIUM CHLORIDE CRYS ER 20 MEQ PO TBCR
60.0000 meq | EXTENDED_RELEASE_TABLET | Freq: Once | ORAL | Status: AC
  Administered 2018-02-16: 60 meq via ORAL
  Filled 2018-02-16: qty 3

## 2018-02-16 NOTE — Progress Notes (Signed)
Physical Therapy Treatment Patient Details Name: Sarah [name] MRN: [id number] DOB: 1965-04-21 Today's Date: 02/16/2018    History of Present Illness Sarah [name]  is a 52 y.o. 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  Patient states that symptoms have been going on for past 3 days.  She also complains of nausea but no vomiting.  Denies any fever.  Denies chest pain.    PT Comments    Patient demonstrates good return for using bed rail for rolling to side and pulling self up to sitting with Max assistance to move legs.  Patient tolerated sitting up at bedside for approximately 20 minutes while practicing weight shifting exercises left to right and side to side with arms crossed, once fatigued frequently leans backwards requiring Min assist to keep trunk in midline.  Patient able to use BUE to help pull self up in bed using head board with bed in head down position during repositioning in bed.  Patient will benefit from continued physical therapy in hospital and recommended venue below to increase strength, balance, endurance for safe ADLs and gait.    Follow Up Recommendations  SNF     Equipment Recommendations  None recommended by PT    Recommendations for Other Services       Precautions / Restrictions Precautions Precautions: None Precaution Comments: paraplegic Restrictions Weight Bearing Restrictions: No    Mobility  Bed Mobility Overal bed mobility: Needs Assistance Bed Mobility: Supine to Sit;Sit to Supine     Supine to sit: Max assist Sit to supine: Max assist   General bed mobility comments: used BUE to help pull self to sidelying and sitting  Transfers                    Ambulation/Gait                 Stairs              Wheelchair Mobility    Modified Rankin (Stroke Patients Only)       Balance Overall balance assessment: Needs assistance Sitting-balance support: Feet supported;No upper extremity supported Sitting balance-Leahy Scale: Poor Sitting balance - Comments: fair using BUE to support self Postural control: Posterior lean                                  Cognition Arousal/Alertness: Awake/alert Behavior During Therapy: WFL for tasks assessed/performed Overall Cognitive Status: Within Functional Limits for tasks assessed                                        Exercises Other Exercises Other Exercises: PROM to BLE all planes of motion x 10 reps each    General Comments        Pertinent Vitals/Pain Pain Assessment: 0-10 Pain Score: 5  Pain Location: stomach possibly due to coughing Pain Descriptors / Indicators: Aching    Home Living                      Prior Function            PT Goals (current goals can now be  found in the care plan section) Acute Rehab PT Goals Patient Stated Goal: return home after rehab PT Goal Formulation: With patient/family Time For Goal Achievement: 02/25/18 Potential to Achieve Goals: Fair Progress towards PT goals: Progressing toward goals    Frequency    Min 3X/week      PT Plan Current plan remains appropriate    Co-evaluation              AM-PAC PT "6 Clicks" Mobility   Outcome Measure                   End of Session Equipment Utilized During Treatment: Oxygen Activity Tolerance: Patient tolerated treatment well;Patient limited by fatigue Patient left: in bed;with call bell/phone within reach;with family/visitor present Nurse Communication: Mobility status PT Visit Diagnosis: Other symptoms and signs involving the nervous system (R29.898);Unsteadiness on feet (R26.81);Muscle weakness (generalized) (M62.81)     Time: 3005-1102 PT Time Calculation (min) (ACUTE  ONLY): 33 min  Charges:  $Therapeutic Exercise: 8-22 mins $Therapeutic Activity: 8-22 mins                     4:10 PM, 02/16/18 Lonell Grandchild, MPT Physical Therapist with [facility] [phone] office [phone] mobile phone

## 2018-02-16 NOTE — Progress Notes (Signed)
Palliative: Sarah [name] is resting quietly in bed.  She greets me making and keeping eye contact.  She appears to have better color than yesterday.  Present today at bedside is sister Magdalene River and her husband [name], her mother Joaquim Lai, her father Herbie [place], her sister [place].  We talked in detail about her CT results from yesterday.  Positive in that we did not see something ominous, but it would have been helpful to find something to explain her sudden blood loss.  We talk in detail about what is normal hemoglobin, what is expected for her.  We talked in detail about white blood cells normalizing, kidney function okay.  We also talked in detail about albumin, what is normal, and how to improve nutritional intake.  Several suggestions offered for supplementation.  I encouraged family that Ms. Scarpati will do the best she can, she may not be able to eat or drink as much protein shakes as they would like.  We talked in detail about healthcare power of attorney, her right to naming one person, and the importance of letting her family knows what she does and does not want.  I share that as long as she can speak we will hear her voice, and that when her family speaks for her, it is important that they also use her voice.  At this point, family makes choices together.  We talked in detail about CODE STATUS including statistics.  I talked about the concept of treat the treatable but allowing natural death.  I share my concern that if, with modern medical treatment, (such as antibiotics, IV fluids, CTs and x-rays) she still declines and worsens, I worry about her ability to survive CPR.  I share that if she does survive CPR, it does not change her cancer, paraplegia, poor nutritional state.  I share that regardless of where she is in her health we will continue to care for her.  All questions answered, support given.  [place] clearly here in respect Ms. Lovena Le.  Frequently during our conversation  mother Joaquim Lai tells Ms. Felipe what she must do, and what is expected of her.  Multiple times I redirect the conversation to state that Ms. Pincus decides, doing the best she can.  Conference with hospitalist related to goals of care discussion, CODE STATUS discussion, disposition. Contact with social work related to patient and family desire to further discuss rehab.  95 minutes, extended time.  [name], NP Palliative Medicine Team Team Phone # [phone]  Greater than 50% of this time was spent counseling and coordinating care related to the above assessment and plan.

## 2018-02-16 NOTE — Progress Notes (Signed)
Subjective: She feels better.  She is still coughing and bringing up a little bit of sputum.  Objective: Vital signs in last 24 hours: Temp:  [98.2 F (36.8 C)-98.6 F (37 C)] 98.2 F (36.8 C) (11/26 2876) Pulse Rate:  [62-73] 71 (11/26 0608) Resp:  [16-20] 18 (11/26 8115) BP: (106-150)/(71-87) 106/71 (11/26 0608) SpO2:  [98 %-100 %] 98 % (11/26 0829) Weight change:  Last BM Date: 02/15/18  Intake/Output from previous day: 11/25 0701 - 11/26 0700 In: 5071 [P.O.:1800; I.V.:502.2; Blood:910; IV Piggyback:1758.8] Out: 1300 [Urine:1300]  PHYSICAL EXAM General appearance: alert, cooperative and no distress Resp: rhonchi bilaterally Cardio: regular rate and rhythm, S1, S2 normal, no murmur, click, rub or gallop GI: soft, non-tender; bowel sounds normal; no masses,  no organomegaly Extremities: extremities normal, atraumatic, no cyanosis or edema  Lab Results:  Results for orders placed or performed during the hospital encounter of 02/03/18 (from the past 48 hour(s))  Vancomycin, trough     Status: Abnormal   Collection Time: 02/14/18  9:49 AM  Result Value Ref Range   Vancomycin Tr 38 (HH) 15 - 20 ug/mL    Comment: CRITICAL RESULT CALLED TO, READ BACK BY AND VERIFIED WITH: MARTIN,D@1051  BY MATTHEWS, B 11.24.19 Performed at [facility], [address], [place], [place] [address]   Comprehensive metabolic panel     Status: Abnormal   Collection Time: 02/15/18  6:20 AM  Result Value Ref Range   Sodium 140 135 - 145 mmol/L   Potassium 3.5 3.5 - 5.1 mmol/L   Chloride 99 98 - 111 mmol/L   CO2 34 (H) 22 - 32 mmol/L   Glucose, Bld 134 (H) 70 - 99 mg/dL   BUN 31 (H) 6 - 20 mg/dL   Creatinine, Ser 1.03 (H) 0.44 - 1.00 mg/dL   Calcium 7.4 (L) 8.9 - 10.3 mg/dL   Total Protein 5.1 (L) 6.5 - 8.1 g/dL   Albumin 1.8 (L) 3.5 - 5.0 g/dL   AST 24 15 - 41 U/L   ALT 12 0 - 44 U/L   Alkaline Phosphatase 94 38 - 126 U/L   Total Bilirubin 0.7 0.3 - 1.2 mg/dL   GFR calc non Af Amer >60 >60  mL/min   GFR calc Af Amer >60 >60 mL/min    Comment: (NOTE) The eGFR has been calculated using the CKD EPI equation. This calculation has not been validated in all clinical situations. eGFR's persistently <60 mL/min signify possible Chronic Kidney Disease.    Anion gap 7 5 - 15    Comment: Performed at [facility], [address] [place], [place] [address]  CBC with Differential/Platelet     Status: Abnormal   Collection Time: 02/15/18  6:20 AM  Result Value Ref Range   WBC 6.5 4.0 - 10.5 K/uL   RBC 2.64 (L) 3.87 - 5.11 MIL/uL   Hemoglobin 7.4 (L) 12.0 - 15.0 g/dL   HCT 23.8 (L) 36.0 - 46.0 %   MCV 90.2 80.0 - 100.0 fL   MCH 28.0 26.0 - 34.0 pg   MCHC 31.1 30.0 - 36.0 g/dL   RDW 15.7 (H) 11.5 - 15.5 %   Platelets 98 (L) 150 - 400 K/uL    Comment: Immature Platelet Fraction may be clinically indicated, consider ordering this additional test [id number]    nRBC 0.0 0.0 - 0.2 %   Neutrophils Relative % 89 %   Neutro Abs 5.8 1.7 - 7.7 K/uL   Lymphocytes Relative 7 %   Lymphs  Abs 0.4 (L) 0.7 - 4.0 K/uL   Monocytes Relative 3 %   Monocytes Absolute 0.2 0.1 - 1.0 K/uL   Eosinophils Relative 0 %   Eosinophils Absolute 0.0 0.0 - 0.5 K/uL   Basophils Relative 0 %   Basophils Absolute 0.0 0.0 - 0.1 K/uL   Immature Granulocytes 1 %   Abs Immature Granulocytes 0.08 (H) 0.00 - 0.07 K/uL    Comment: Performed at [facility], [address] [place], [place] [address]  Magnesium     Status: None   Collection Time: 02/15/18  6:20 AM  Result Value Ref Range   Magnesium 2.2 1.7 - 2.4 mg/dL    Comment: Performed at [facility], [address], [place], [place] [address]  Vancomycin, trough     Status: Abnormal   Collection Time: 02/15/18  6:20 AM  Result Value Ref Range   Vancomycin Tr 25 (HH) 15 - 20 ug/mL    Comment: CRITICAL RESULT CALLED TO, READ BACK BY AND VERIFIED WITH: RHEW,J AT 6:55AM ON 02/15/18 BY Derrill Memo Performed at [facility], [address] [place], [place]  [address]   Type and screen [facility]     Status: None (Preliminary result)   Collection Time: 02/15/18  9:58 AM  Result Value Ref Range   ABO/RH(D) O POS    Antibody Screen NEG    Sample Expiration 02/18/2018    Unit Number [id number]    Blood Component Type RED CELLS,LR    Unit division 00    Status of Unit ISSUED    Transfusion Status OK TO TRANSFUSE    Crossmatch Result      Compatible Performed at [facility], [address], [place], [place] [address]    Unit Number [id number]    Blood Component Type RBC LR PHER2    Unit division 00    Status of Unit ISSUED    Transfusion Status OK TO TRANSFUSE    Crossmatch Result Compatible   Prepare RBC     Status: None   Collection Time: 02/15/18 10:01 AM  Result Value Ref Range   Order Confirmation      ORDER PROCESSED BY BLOOD BANK Performed at [facility], [address], [place], [place] [address]   Hemoglobin and hematocrit, blood     Status: Abnormal   Collection Time: 02/15/18  8:08 PM  Result Value Ref Range   Hemoglobin 11.8 (L) 12.0 - 15.0 g/dL    Comment: POST TRANSFUSION SPECIMEN   HCT 37.2 36.0 - 46.0 %    Comment: Performed at [facility], [address], [place], [place] [address]  Comprehensive metabolic panel     Status: Abnormal   Collection Time: 02/16/18  6:08 AM  Result Value Ref Range   Sodium 140 135 - 145 mmol/L   Potassium 3.2 (L) 3.5 - 5.1 mmol/L   Chloride 100 98 - 111 mmol/L   CO2 34 (H) 22 - 32 mmol/L   Glucose, Bld 85 70 - 99 mg/dL   BUN 26 (H) 6 - 20 mg/dL   Creatinine, Ser 1.11 (H) 0.44 - 1.00 mg/dL   Calcium 7.4 (L) 8.9 - 10.3 mg/dL   Total Protein 5.1 (L) 6.5 - 8.1 g/dL   Albumin 1.8 (L) 3.5 - 5.0 g/dL   AST 23 15 - 41 U/L   ALT 14 0 - 44 U/L   Alkaline Phosphatase 106 38 - 126 U/L   Total Bilirubin 0.8 0.3 - 1.2 mg/dL   GFR calc  non Af Amer 56 (L) >60 mL/min   GFR calc Af Amer >60 >60 mL/min    Comment: (NOTE) The eGFR has been calculated using the CKD EPI  equation. This calculation has not been validated in all clinical situations. eGFR's persistently <60 mL/min signify possible Chronic Kidney Disease.    Anion gap 6 5 - 15    Comment: Performed at [facility], [address], [place], [place] [address]  CBC with Differential/Platelet     Status: Abnormal   Collection Time: 02/16/18  6:08 AM  Result Value Ref Range   WBC 7.1 4.0 - 10.5 K/uL   RBC 4.01 3.87 - 5.11 MIL/uL   Hemoglobin 11.4 (L) 12.0 - 15.0 g/dL   HCT 35.9 (L) 36.0 - 46.0 %   MCV 89.5 80.0 - 100.0 fL   MCH 28.4 26.0 - 34.0 pg   MCHC 31.8 30.0 - 36.0 g/dL   RDW 15.7 (H) 11.5 - 15.5 %   Platelets 115 (L) 150 - 400 K/uL    Comment: SPECIMEN CHECKED FOR CLOTS Immature Platelet Fraction may be clinically indicated, consider ordering this additional test [id number] CRITICAL VALUE NOTED.  VALUE IS CONSISTENT WITH PREVIOUSLY REPORTED AND CALLED VALUE.    nRBC 0.0 0.0 - 0.2 %   Neutrophils Relative % 80 %   Neutro Abs 5.7 1.7 - 7.7 K/uL   Lymphocytes Relative 11 %   Lymphs Abs 0.8 0.7 - 4.0 K/uL   Monocytes Relative 4 %   Monocytes Absolute 0.3 0.1 - 1.0 K/uL   Eosinophils Relative 4 %   Eosinophils Absolute 0.3 0.0 - 0.5 K/uL   Basophils Relative 0 %   Basophils Absolute 0.0 0.0 - 0.1 K/uL   Immature Granulocytes 1 %   Abs Immature Granulocytes 0.10 (H) 0.00 - 0.07 K/uL    Comment: Performed at [facility], [address], [place], [place] [address]   *Note: Due to a large number of results and/or encounters for the requested time period, some results have not been displayed. A complete set of results can be found in Results Review.    ABGS No results for input(s): PHART, PO2ART, TCO2, HCO3 in the last 72 hours.  Invalid input(s): PCO2 CULTURES No results found for this or any previous visit (from the past 240 hour(s)). Studies/Results: Ct Chest W Contrast  Result Date: 02/15/2018 CLINICAL DATA:  Acute blood-loss anemia. Concern for retroperitoneal bleed.  Metastatic thyroid cancer. Abdominal distension. EXAM: CT CHEST, ABDOMEN AND PELVIS WITHOUT CONTRAST TECHNIQUE: Multidetector CT imaging of the chest, abdomen and pelvis was performed following the standard protocol without IV contrast. COMPARISON:  Abdominopelvic CT 10 days ago 02/05/2018. Thoracic spine CT 10/26/2017 PET CT 05/20/2017 FINDINGS: CT CHEST FINDINGS Cardiovascular: Right upper extremity PICC with tip in the distal SVC. No central pulmonary embolus. Thoracic aorta is normal in caliber. No evidence of dissection. Normal heart size. No pericardial effusion. Mediastinum/Nodes: Supraclavicular regions partially obscured by patient's arms. No enlarged mediastinal or hilar lymph nodes. Thyroid gland is not well visualized and is likely surgically absent. The esophagus is decompressed. Lungs/Pleura: Moderate bilateral pleural effusions, slightly increased in size from included portion of abdominal CT 02/05/2018. Fluid measures simple density without evidence of hemothorax. There are multifocal patchy opacities throughout all lobes of both lungs. Septal thickening in the lower lobes. Biapical consolidations with bronchiectasis. Retained mucus or debris in the dependent trachea and right mainstem bronchus. Pulmonary nodules on prior PET are obscured by ground-glass opacities and apical consolidations. Musculoskeletal: Known T3  metastatic disease with posterior fusion T1 through T5, better assessed on prior thoracic myelography. Lesion involves the posterior aspect of adjacent T2 and T4 vertebral bodies with severe T3 compression fracture as before. Spinal canal involvement not well assessed on the current exam. No new compression fracture. CT ABDOMEN PELVIS FINDINGS Hepatobiliary: No focal hepatic lesion. Postcholecystectomy with clips in the gallbladder fossa. Persistent fluid in the operative bed with slightly complex fluid but resolution of air. No peripheral enhancement. No other perihepatic fluid. No  biliary dilatation. Pancreas: Negative.  No ductal dilatation or inflammation. Spleen: Normal in size without focal abnormality. No perisplenic fluid. Adrenals/Urinary Tract: Normal adrenal glands. No hydronephrosis or perinephric edema. Homogeneous renal enhancement with symmetric excretion on delayed phase imaging. Urinary bladder is physiologically distended without wall thickening. Stomach/Bowel: Slight rectal wall thickening. No other bowel wall thickening, inflammatory change or obstruction. Enteric contrast reaches the colon. Sigmoid colonic redundancy as before. Stomach is physiologically distended. Normal appendix. No mesenteric hematoma. Vascular/Lymphatic: No retroperitoneal stranding or hemorrhage. Abdominal aorta and IVC are intact. No enlarged abdominal or pelvic lymph nodes. Reproductive: Status post hysterectomy. No adnexal masses. Small amount of free fluid in the pelvis, slightly diminished from prior. Other: Small amount of free fluid in the pelvis measures simple fluid density. No evidence of hemoperitoneum. Subcutaneous edema in the abdominal wall/flanks. No free air. Musculoskeletal: Multilevel degenerative change in the lumbar spine. Left proximal femur hardware is partially included. No obvious osseous lesion. Spinal canal a soft tissue density at L1-L2 on prior myelography is not well seen currently. IMPRESSION: 1. No evidence of active bleeding in the chest, abdomen, or pelvis. No retroperitoneal bleed. 2. Moderate bilateral pleural effusions. Multifocal ground-glass opacities throughout both lungs may be pulmonary edema or pneumonia. Mild septal thickening suggests an element of pulmonary edema. 3. Biapical consolidations with bronchiectasis may represent postradiation change, recommend correlation with treatment history. 4. 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 5. Recent cholecystectomy with small amount of residual  complex fluid in the operative bed. This may represent postoperative seroma, there is no peripheral enhancement to suggest superimposed infection. Biloma is not entirely excluded, however lack of significant increase compared to CT 10 days ago argues against this, and there is no other perihepatic fluid. 6. Small amount of free fluid in the pelvis, slightly diminished from prior exam. 7. Confluent edema in the body wall/flanks, similar to prior. Electronically Signed   By: Keith Rake M.D.   On: 02/15/2018 23:31   Ct Abdomen Pelvis W Contrast  Result Date: 02/15/2018 CLINICAL DATA:  Acute blood-loss anemia. Concern for retroperitoneal bleed. Metastatic thyroid cancer. Abdominal distension. EXAM: CT CHEST, ABDOMEN AND PELVIS WITHOUT CONTRAST TECHNIQUE: Multidetector CT imaging of the chest, abdomen and pelvis was performed following the standard protocol without IV contrast. COMPARISON:  Abdominopelvic CT 10 days ago 02/05/2018. Thoracic spine CT 10/26/2017 PET CT 05/20/2017 FINDINGS: CT CHEST FINDINGS Cardiovascular: Right upper extremity PICC with tip in the distal SVC. No central pulmonary embolus. Thoracic aorta is normal in caliber. No evidence of dissection. Normal heart size. No pericardial effusion. Mediastinum/Nodes: Supraclavicular regions partially obscured by patient's arms. No enlarged mediastinal or hilar lymph nodes. Thyroid gland is not well visualized and is likely surgically absent. The esophagus is decompressed. Lungs/Pleura: Moderate bilateral pleural effusions, slightly increased in size from included portion of abdominal CT 02/05/2018. Fluid measures simple density without evidence of hemothorax. There are multifocal patchy opacities throughout all lobes of both lungs. Septal  thickening in the lower lobes. Biapical consolidations with bronchiectasis. Retained mucus or debris in the dependent trachea and right mainstem bronchus. Pulmonary nodules on prior PET are obscured by  ground-glass opacities and apical consolidations. Musculoskeletal: Known T3 metastatic disease with posterior fusion T1 through T5, better assessed on prior thoracic myelography. Lesion involves the posterior aspect of adjacent T2 and T4 vertebral bodies with severe T3 compression fracture as before. Spinal canal involvement not well assessed on the current exam. No new compression fracture. CT ABDOMEN PELVIS FINDINGS Hepatobiliary: No focal hepatic lesion. Postcholecystectomy with clips in the gallbladder fossa. Persistent fluid in the operative bed with slightly complex fluid but resolution of air. No peripheral enhancement. No other perihepatic fluid. No biliary dilatation. Pancreas: Negative.  No ductal dilatation or inflammation. Spleen: Normal in size without focal abnormality. No perisplenic fluid. Adrenals/Urinary Tract: Normal adrenal glands. No hydronephrosis or perinephric edema. Homogeneous renal enhancement with symmetric excretion on delayed phase imaging. Urinary bladder is physiologically distended without wall thickening. Stomach/Bowel: Slight rectal wall thickening. No other bowel wall thickening, inflammatory change or obstruction. Enteric contrast reaches the colon. Sigmoid colonic redundancy as before. Stomach is physiologically distended. Normal appendix. No mesenteric hematoma. Vascular/Lymphatic: No retroperitoneal stranding or hemorrhage. Abdominal aorta and IVC are intact. No enlarged abdominal or pelvic lymph nodes. Reproductive: Status post hysterectomy. No adnexal masses. Small amount of free fluid in the pelvis, slightly diminished from prior. Other: Small amount of free fluid in the pelvis measures simple fluid density. No evidence of hemoperitoneum. Subcutaneous edema in the abdominal wall/flanks. No free air. Musculoskeletal: Multilevel degenerative change in the lumbar spine. Left proximal femur hardware is partially included. No obvious osseous lesion. Spinal canal a soft tissue  density at L1-L2 on prior myelography is not well seen currently. IMPRESSION: 1. No evidence of active bleeding in the chest, abdomen, or pelvis. No retroperitoneal bleed. 2. Moderate bilateral pleural effusions. Multifocal ground-glass opacities throughout both lungs may be pulmonary edema or pneumonia. Mild septal thickening suggests an element of pulmonary edema. 3. Biapical consolidations with bronchiectasis may represent postradiation change, recommend correlation with treatment history. 4. 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 5. Recent cholecystectomy with small amount of residual complex fluid in the operative bed. This may represent postoperative seroma, there is no peripheral enhancement to suggest superimposed infection. Biloma is not entirely excluded, however lack of significant increase compared to CT 10 days ago argues against this, and there is no other perihepatic fluid. 6. Small amount of free fluid in the pelvis, slightly diminished from prior exam. 7. Confluent edema in the body wall/flanks, similar to prior. Electronically Signed   By: Keith Rake M.D.   On: 02/15/2018 23:31   Dg Chest Port 1 View  Result Date: 02/16/2018 CLINICAL DATA:  Follow-up multifocal pneumonia EXAM: PORTABLE CHEST 1 VIEW COMPARISON:  02/15/2018 FINDINGS: Cardiac shadow remains enlarged. Bilateral pleural effusions are again seen. Biapical consolidative changes are again identified and unchanged. No new focal infiltrate is seen. Postsurgical changes at the cervicothoracic junction are noted. Right-sided PICC line is seen in satisfactory position. IMPRESSION: Overall stable appearance of the chest with biapical consolidation. Electronically Signed   By: Inez Catalina M.D.   On: 02/16/2018 07:49    Medications:  Prior to Admission:  Medications Prior to Admission  Medication Sig Dispense Refill Last Dose  . acetaminophen (TYLENOL) 325  MG tablet Take 2 tablets (650 mg total) by mouth every 4 (four) hours as needed  for mild pain ((score 1 to 3) or temp > 100.5).   Past Month at Unknown time  . baclofen (LIORESAL) 10 MG tablet Take 25 mg by mouth 4 (four) times daily as needed for muscle spasms.    02/03/2018 at Unknown time  . famotidine (PEPCID) 20 MG tablet Take 1 tablet (20 mg total) by mouth 2 (two) times daily. 60 tablet 0 Past Week at Unknown time  . fluticasone (FLONASE) 50 MCG/ACT nasal spray Place 2 sprays into both nostrils daily. 16 g 6 02/02/2018 at Unknown time  . levothyroxine (SYNTHROID, LEVOTHROID) 150 MCG tablet Take 150 mcg by mouth daily before breakfast.    02/02/2018 at Unknown time  . loratadine (CLARITIN) 10 MG tablet Take 1 tablet (10 mg total) by mouth daily.   Past Week at Unknown time  . ondansetron (ZOFRAN) 4 MG tablet Take 1 tablet by mouth every 6 (six) hours as needed for nausea or vomiting.    02/02/2018 at Unknown time  . oxyCODONE (ROXICODONE) 5 MG immediate release tablet Take 1 tablet (5 mg total) by mouth every 4 (four) hours as needed. 20 tablet 0 unknown  . oxyCODONE-acetaminophen (PERCOCET/ROXICET) 5-325 MG tablet Take 0.5-1 tablets by mouth every 6 (six) hours as needed for severe pain.   02/03/2018 at Unknown time  . pantoprazole (PROTONIX) 40 MG tablet Take 1 tablet by mouth daily.   Past Week at Unknown time  . polyethylene glycol (MIRALAX / GLYCOLAX) packet Take 17 g by mouth daily. 14 each 0 Past Week at Unknown time  . rivaroxaban (XARELTO) 20 MG TABS tablet Take 20 mg by mouth daily with supper.   02/02/2018 at 1700  . senna (SENOKOT) 8.6 MG TABS tablet Take 1 tablet (8.6 mg total) by mouth 2 (two) times daily. 120 each 0 Past Week at Unknown time  . sertraline (ZOLOFT) 50 MG tablet    02/02/2018 at Unknown time  . sucralfate (CARAFATE) 1 g tablet Take 1 tablet (1 g total) by mouth 4 (four) times daily -  with meals and at bedtime. Please dissolve tablet in 10-15 mL of water to create a  slurry. Patient should drink 10-15 minutes before each meals and at bedtime to reduce painful swallowing. 120 tablet 2 02/02/2018 at Unknown time  . tiZANidine (ZANAFLEX) 2 MG tablet Take 2 mg by mouth every 8 (eight) hours as needed for muscle spasms.    Past Month at Unknown time  . sodium phosphate (FLEET) 7-19 GM/118ML ENEM Place 133 mLs (1 enema total) rectally daily as needed for severe constipation. (Patient not taking: Reported on 01/05/2018)  0 More than a month at Unknown time  . sucralfate (CARAFATE) 1 GM/10ML suspension Take 10 mLs (1 g total) by mouth 4 (four) times daily -  with meals and at bedtime. (Patient not taking: Reported on 02/03/2018) 420 mL 0 Not Taking at Unknown time   Scheduled: . sodium chloride   Intravenous Once  . sodium chloride   Intravenous Once  . acetylcysteine  4 mL Nebulization BID  . Chlorhexidine Gluconate Cloth  6 each Topical Daily  . famotidine  20 mg Oral BID  . feeding supplement (ENSURE ENLIVE)  237 mL Oral TID BM  . feeding supplement (PRO-STAT SUGAR FREE 64)  30 mL Oral TID BM  . [START ON 02/17/2018] furosemide  40 mg Oral Daily  . guaiFENesin  1,200 mg Oral BID  . ipratropium-albuterol  3 mL Nebulization TID  . levothyroxine  150 mcg Oral Q0600  .  loratadine  10 mg Oral Daily  . mouth rinse  15 mL Mouth Rinse BID  . methylPREDNISolone (SOLU-MEDROL) injection  40 mg Intravenous Daily  . multivitamin with minerals  1 tablet Oral Daily  . pantoprazole  40 mg Oral Daily  . polyethylene glycol  17 g Oral Daily  . [START ON 02/17/2018] potassium chloride  40 mEq Oral Daily  . senna  1 tablet Oral BID  . sertraline  50 mg Oral Daily   Continuous: . ceFEPime (MAXIPIME) IV 2 g (02/16/18 0400)  . vancomycin 750 mg (02/15/18 2348)   OEC:[id number], albuterol, ALPRAZolam, baclofen, dextromethorphan, ondansetron **OR** ondansetron (ZOFRAN) IV, oxyCODONE, promethazine, sodium chloride flush, sodium phosphate, tiZANidine  Assesment: She was  admitted with pneumonia.  She is had trouble clearing secretions because she has paraplegia.  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  She does seem to be slowly improving    LOS: 12 days   Finnley Lewis L 02/16/2018, 9:13 AM

## 2018-02-16 NOTE — Clinical Social Work Note (Signed)
LCSW following. Received word that pt has questions about [facility] and what her rehab stay would be like. Spoke with pt to ask if she would like for Debbie from [facility] to come and meet with her. Pt asks for Debbie to call her room to arrange for a time to meet. Will pass along this request to [facility]. [facility] still working on authorization for pt's admission. CSW will follow.

## 2018-02-16 NOTE — Progress Notes (Signed)
PROGRESS NOTE  Sarah [name]  YBO:[id number]  DOB: 05-27-1965  DOA: 02/03/2018 PCP: [name], MD  Brief Admission Hx: 52 y.o. 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  Patient states that symptoms have been going on for past 3 days.  She also complains of nausea but no vomiting.  Denies any fever.  Denies chest pain.  Pt was admitted with a multifocal pneumonia.  Newly discovered brain mets seen on CT brain.   MDM/Assessment & Plan:   1. Healthcare associated pneumonia-chest x-ray shows masslike opacity, likely infectious as it developed quickly since December 13, 2017.  CT confirms a multifocal pneumonia.  Pt was initially started on ceftriaxone and Zithromax but given the severity of infection and recent postop status, coverage was broadened to cefepime/vancomycin.  MRSA (+).   Blood cultures NGTD.  Strep pneumo and legionella antigens negative.  Progress will be slow since she has a very weak cough reflex and poor inspiratory effort.  Continue mucomyst nebs. Weaning steroids.  Added chest physiotherapy with percussion.  CXR 11/22 shows progression, and added back vancomycin, continue cefepime.  Consulted pulmonary team to assist with management. Continue lasix. Echo with normal EF.  Palliative care discussions started over the weekend and the family should be meeting with the palliative care team this week for further goals of care discussions.  Pt is clinically showing improvement and hopefully can de-escalate antibiotics soon.  2. History of pulmonary embolism-continue Xarelto for full anticoagulation.  3. 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 Pt has been treated with radiation and chemotherapy.  Pt  has been followed by Neurosurgeon Dr. Christella Noa and oncologist [name].  4. Newly discovered Brain Mets - Pt's CT head shows metastases to brain with dural involvement, MRI recommended, unsure if patient could tolerate MRI.  I spoke with and updated her oncologist 11/25 per patient request.  Palliative medicine consult helping to address goals of care.  Pt leaning towards full scope treatment.   4. Hypothyroidism-continue Synthroid.  5. Hypotension -Resolved now.  Soft blood pressures likely multifactorial from dehydration, infection, anemia, paraplegia.  Pt is asymptomatic and MAP>65.    6. Anemia of chronic disease - Transfused 2 units PRBC 11/15 with improvement of hemoglobin.  Appreciate GI consult and assistance.  No gross GI bleeding. CT abd/pelvis negative for retroperitoneal bleed or hematoma.  Hg up to 9 after transfusion.  Hg on 11/25 back down to 7.4 and patient is symptomatic. GI has seen and don't plan for endoscopy at this time given patient's current condition, transfused additional 2 units PRBC 11/25.  Pt has recently tested hemoccult negative during this admission and seen by GI.  No source of bleeding found, resume xarelto.     7. Hypokalemia -replaced.  Magnesium is normal. 8. Stage 2 sacral pressure injury and stage 1 left heel injury - improving, continue wound care protocol. Consulted WOC nurse.  9. Fecal impaction - Resolved.  Was seen on CT abdomen/pelvis -feels better after receiving enemas.  Continue daily laxatives.  10. Anasarca.  Related to hypoalbuminemia.  Started on Lasix.  Patient has had good urine output.  Dietitian consulted to assist with protein supplementation.  11. Pulmonary edema seen on CXR 11/22.  IV lasix ordered.  Echocardiogram with normal EF and no  diastolic dysfunction. No CHF suspected.     DVT Prophylaxis-  Xarelto on temporary hold 11/25 due to anemia, SCDs ordered for 11/25    Family Communication:  Discussed with family that patient appears to be  dying and I strongly advised them to meet with the palliative care team to help [place] plan on code status.    Code Status:  Full code, palliative care consulted to help address further goals of care.  Family requesting meeting on 11/26 for goals of care.   Consultants:  GI  Palliative care (unable to see patient until 11/25) Family requesting goals of care meeting for 11/26  Antimicrobials:  Ceftriaxone 11/14 >11/16  Azithromycin 11/14>11/16  Vancomycin 11/16 >11/19, 11/22-  Cefepime 11/16 >  Pressure Injury Documentation: Pressure Injury 02/04/18 Stage II -  Partial thickness loss of dermis presenting as a shallow open ulcer with a red, pink wound bed without slough. (Active)  02/04/18 0130   Location: Sacrum  Location Orientation: Mid  Staging: Stage II -  Partial thickness loss of dermis presenting as a shallow open ulcer with a red, pink wound bed without slough.  Wound Description (Comments):   Present on Admission: Yes     Pressure Injury 02/04/18 Stage I -  Intact skin with non-blanchable redness of a localized area usually over a bony prominence. (Active)  02/04/18 0120   Location: Heel  Location Orientation: Left  Staging: Stage I -  Intact skin with non-blanchable redness of a localized area usually over a bony prominence.  Wound Description (Comments):   Present on Admission: Yes   Echocardiogram 02/12/18 Study Conclusions  - Left ventricle: The cavity size was normal. Wall thickness was   increased in a pattern of mild LVH. Systolic function was normal.   The estimated ejection fraction was in the range of 55% to 60%.   Wall motion was normal; there were no regional wall motion   abnormalities. Left ventricular diastolic function parameters   were normal. - Aortic valve: Valve area (Vmax): 1.78 cm^2. - Atrial septum: No defect or patent foramen ovale was identified.  Subjective: Pt says that she she feels cold and tired.      Objective: Vitals:    02/15/18 1929 02/15/18 2154 02/16/18 0608 02/16/18 0829  BP:  (!) 141/80 106/71   Pulse: 69 69 71   Resp: 20 16 18    Temp:  98.2 F (36.8 C) 98.2 F (36.8 C)   TempSrc:  Oral Oral   SpO2: 98% 99% 99% 98%  Weight:      Height:        Intake/Output Summary (Last 24 hours) at 02/16/2018 1203 Last data filed at 02/16/2018 1100 Gross per 24 hour  Intake 4951 ml  Output 500 ml  Net 4451 ml   Filed Weights   02/08/18 0500 02/10/18 0427 02/11/18 0500  Weight: 111.8 kg 111 kg 110.1 kg   REVIEW OF SYSTEMS  As per history otherwise all reviewed and reported negative  Exam:  General exam: chronically ill appearing female, Alert, awake, oriented x 3, appears pale and weak Respiratory system: coarse anterior congestion, rales slightly improved on the left, no change on the right side. No increased WOB. Cardiovascular system:normal s1,s2 sounds. No murmurs, rubs, gallops. Gastrointestinal system: Abdomen is nondistended, soft and nontender. No organomegaly or masses felt. Normal bowel sounds heard. Extremities: 1+ edema bilateral LEs unchanged.  Skin: No rashes, lesions or ulcers.  Neuro: paraplegic.   Psychiatry: anxious.    Data Reviewed: Basic  Metabolic Panel: Recent Labs  Lab 02/12/18 0427 02/13/18 0711 02/14/18 0644 02/15/18 0620 02/16/18 0608  NA 139 139 139 140 140  K 4.1 3.7 3.6 3.5 3.2*  CL 106 104 101 99 100  CO2 27 30 32 34* 34*  GLUCOSE 127* 136* 119* 134* 85  BUN 30* 31* 31* 31* 26*  CREATININE 1.17* 1.19* 1.09* 1.03* 1.11*  CALCIUM 7.8* 7.5* 7.6* 7.4* 7.4*  MG 2.4 2.4 2.3 2.2  --   PHOS 3.3  --   --   --   --    Liver Function Tests: Recent Labs  Lab 02/12/18 0427 02/13/18 0711 02/14/18 0644 02/15/18 0620 02/16/18 0608  AST 27 28 28 24 23   ALT 12 12 14 12 14   ALKPHOS 106 97 96 94 106  BILITOT 0.5 0.9 0.8 0.7 0.8  PROT 5.4* 5.3* 5.4* 5.1* 5.1*  ALBUMIN 1.7* 1.7* 1.8* 1.8* 1.8*   No results for input(s): LIPASE, AMYLASE in the last 168 hours. No  results for input(s): AMMONIA in the last 168 hours. CBC: Recent Labs  Lab 02/11/18 0359 02/12/18 0427 02/13/18 0711 02/14/18 0644 02/15/18 0620 02/15/18 2008 02/16/18 0608  WBC 7.4 8.2 6.7 6.3 6.5  --  7.1  NEUTROABS 6.4  --  5.7 5.4 5.8  --  5.7  HGB 9.3* 9.7* 9.4* 9.3* 7.4* 11.8* 11.4*  HCT 30.2* 32.0* 30.9* 30.5* 23.8* 37.2 35.9*  MCV 91.0 92.8 90.9 89.7 90.2  --  89.5  PLT 145* 159 129* 124* 98*  --  115*   Cardiac Enzymes: No results for input(s): CKTOTAL, CKMB, CKMBINDEX, TROPONINI in the last 168 hours. CBG (last 3)  No results for input(s): GLUCAP in the last 72 hours. No results found for this or any previous visit (from the past 240 hour(s)).   Studies: Ct Chest W Contrast  Result Date: 02/15/2018 CLINICAL DATA:  Acute blood-loss anemia. Concern for retroperitoneal bleed. Metastatic thyroid cancer. Abdominal distension. EXAM: CT CHEST, ABDOMEN AND PELVIS WITHOUT CONTRAST TECHNIQUE: Multidetector CT imaging of the chest, abdomen and pelvis was performed following the standard protocol without IV contrast. COMPARISON:  Abdominopelvic CT 10 days ago 02/05/2018. Thoracic spine CT 10/26/2017 PET CT 05/20/2017 FINDINGS: CT CHEST FINDINGS Cardiovascular: Right upper extremity PICC with tip in the distal SVC. No central pulmonary embolus. Thoracic aorta is normal in caliber. No evidence of dissection. Normal heart size. No pericardial effusion. Mediastinum/Nodes: Supraclavicular regions partially obscured by patient's arms. No enlarged mediastinal or hilar lymph nodes. Thyroid gland is not well visualized and is likely surgically absent. The esophagus is decompressed. Lungs/Pleura: Moderate bilateral pleural effusions, slightly increased in size from included portion of abdominal CT 02/05/2018. Fluid measures simple density without evidence of hemothorax. There are multifocal patchy opacities throughout all lobes of both lungs. Septal thickening in the lower lobes. Biapical  consolidations with bronchiectasis. Retained mucus or debris in the dependent trachea and right mainstem bronchus. Pulmonary nodules on prior PET are obscured by ground-glass opacities and apical consolidations. Musculoskeletal: Known T3 metastatic disease with posterior fusion T1 through T5, better assessed on prior thoracic myelography. Lesion involves the posterior aspect of adjacent T2 and T4 vertebral bodies with severe T3 compression fracture as before. Spinal canal involvement not well assessed on the current exam. No new compression fracture. CT ABDOMEN PELVIS FINDINGS Hepatobiliary: No focal hepatic lesion. Postcholecystectomy with clips in the gallbladder fossa. Persistent fluid in the operative bed with slightly complex fluid but resolution of air. No peripheral enhancement. No other perihepatic fluid.  No biliary dilatation. Pancreas: Negative.  No ductal dilatation or inflammation. Spleen: Normal in size without focal abnormality. No perisplenic fluid. Adrenals/Urinary Tract: Normal adrenal glands. No hydronephrosis or perinephric edema. Homogeneous renal enhancement with symmetric excretion on delayed phase imaging. Urinary bladder is physiologically distended without wall thickening. Stomach/Bowel: Slight rectal wall thickening. No other bowel wall thickening, inflammatory change or obstruction. Enteric contrast reaches the colon. Sigmoid colonic redundancy as before. Stomach is physiologically distended. Normal appendix. No mesenteric hematoma. Vascular/Lymphatic: No retroperitoneal stranding or hemorrhage. Abdominal aorta and IVC are intact. No enlarged abdominal or pelvic lymph nodes. Reproductive: Status post hysterectomy. No adnexal masses. Small amount of free fluid in the pelvis, slightly diminished from prior. Other: Small amount of free fluid in the pelvis measures simple fluid density. No evidence of hemoperitoneum. Subcutaneous edema in the abdominal wall/flanks. No free air.  Musculoskeletal: Multilevel degenerative change in the lumbar spine. Left proximal femur hardware is partially included. No obvious osseous lesion. Spinal canal a soft tissue density at L1-L2 on prior myelography is not well seen currently. IMPRESSION: 1. No evidence of active bleeding in the chest, abdomen, or pelvis. No retroperitoneal bleed. 2. Moderate bilateral pleural effusions. Multifocal ground-glass opacities throughout both lungs may be pulmonary edema or pneumonia. Mild septal thickening suggests an element of pulmonary edema. 3. Biapical consolidations with bronchiectasis may represent postradiation change, recommend correlation with treatment history. 4. 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 5. Recent cholecystectomy with small amount of residual complex fluid in the operative bed. This may represent postoperative seroma, there is no peripheral enhancement to suggest superimposed infection. Biloma is not entirely excluded, however lack of significant increase compared to CT 10 days ago argues against this, and there is no other perihepatic fluid. 6. Small amount of free fluid in the pelvis, slightly diminished from prior exam. 7. Confluent edema in the body wall/flanks, similar to prior. Electronically Signed   By: Keith Rake M.D.   On: 02/15/2018 23:31   Ct Abdomen Pelvis W Contrast  Result Date: 02/15/2018 CLINICAL DATA:  Acute blood-loss anemia. Concern for retroperitoneal bleed. Metastatic thyroid cancer. Abdominal distension. EXAM: CT CHEST, ABDOMEN AND PELVIS WITHOUT CONTRAST TECHNIQUE: Multidetector CT imaging of the chest, abdomen and pelvis was performed following the standard protocol without IV contrast. COMPARISON:  Abdominopelvic CT 10 days ago 02/05/2018. Thoracic spine CT 10/26/2017 PET CT 05/20/2017 FINDINGS: CT CHEST FINDINGS Cardiovascular: Right upper extremity PICC with tip in the distal SVC. No  central pulmonary embolus. Thoracic aorta is normal in caliber. No evidence of dissection. Normal heart size. No pericardial effusion. Mediastinum/Nodes: Supraclavicular regions partially obscured by patient's arms. No enlarged mediastinal or hilar lymph nodes. Thyroid gland is not well visualized and is likely surgically absent. The esophagus is decompressed. Lungs/Pleura: Moderate bilateral pleural effusions, slightly increased in size from included portion of abdominal CT 02/05/2018. Fluid measures simple density without evidence of hemothorax. There are multifocal patchy opacities throughout all lobes of both lungs. Septal thickening in the lower lobes. Biapical consolidations with bronchiectasis. Retained mucus or debris in the dependent trachea and right mainstem bronchus. Pulmonary nodules on prior PET are obscured by ground-glass opacities and apical consolidations. Musculoskeletal: Known T3 metastatic disease with posterior fusion T1 through T5, better assessed on prior thoracic myelography. Lesion involves the posterior aspect of adjacent T2 and T4 vertebral bodies with severe T3 compression fracture as before. Spinal canal involvement not well assessed on the current exam. No new compression fracture.  CT ABDOMEN PELVIS FINDINGS Hepatobiliary: No focal hepatic lesion. Postcholecystectomy with clips in the gallbladder fossa. Persistent fluid in the operative bed with slightly complex fluid but resolution of air. No peripheral enhancement. No other perihepatic fluid. No biliary dilatation. Pancreas: Negative.  No ductal dilatation or inflammation. Spleen: Normal in size without focal abnormality. No perisplenic fluid. Adrenals/Urinary Tract: Normal adrenal glands. No hydronephrosis or perinephric edema. Homogeneous renal enhancement with symmetric excretion on delayed phase imaging. Urinary bladder is physiologically distended without wall thickening. Stomach/Bowel: Slight rectal wall thickening. No other  bowel wall thickening, inflammatory change or obstruction. Enteric contrast reaches the colon. Sigmoid colonic redundancy as before. Stomach is physiologically distended. Normal appendix. No mesenteric hematoma. Vascular/Lymphatic: No retroperitoneal stranding or hemorrhage. Abdominal aorta and IVC are intact. No enlarged abdominal or pelvic lymph nodes. Reproductive: Status post hysterectomy. No adnexal masses. Small amount of free fluid in the pelvis, slightly diminished from prior. Other: Small amount of free fluid in the pelvis measures simple fluid density. No evidence of hemoperitoneum. Subcutaneous edema in the abdominal wall/flanks. No free air. Musculoskeletal: Multilevel degenerative change in the lumbar spine. Left proximal femur hardware is partially included. No obvious osseous lesion. Spinal canal a soft tissue density at L1-L2 on prior myelography is not well seen currently. IMPRESSION: 1. No evidence of active bleeding in the chest, abdomen, or pelvis. No retroperitoneal bleed. 2. Moderate bilateral pleural effusions. Multifocal ground-glass opacities throughout both lungs may be pulmonary edema or pneumonia. Mild septal thickening suggests an element of pulmonary edema. 3. Biapical consolidations with bronchiectasis may represent postradiation change, recommend correlation with treatment history. 4. 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 5. Recent cholecystectomy with small amount of residual complex fluid in the operative bed. This may represent postoperative seroma, there is no peripheral enhancement to suggest superimposed infection. Biloma is not entirely excluded, however lack of significant increase compared to CT 10 days ago argues against this, and there is no other perihepatic fluid. 6. Small amount of free fluid in the pelvis, slightly diminished from prior exam. 7. Confluent edema in the body wall/flanks, similar to  prior. Electronically Signed   By: Keith Rake M.D.   On: 02/15/2018 23:31   Dg Chest Port 1 View  Result Date: 02/16/2018 CLINICAL DATA:  Follow-up multifocal pneumonia EXAM: PORTABLE CHEST 1 VIEW COMPARISON:  02/15/2018 FINDINGS: Cardiac shadow remains enlarged. Bilateral pleural effusions are again seen. Biapical consolidative changes are again identified and unchanged. No new focal infiltrate is seen. Postsurgical changes at the cervicothoracic junction are noted. Right-sided PICC line is seen in satisfactory position. IMPRESSION: Overall stable appearance of the chest with biapical consolidation. Electronically Signed   By: Inez Catalina M.D.   On: 02/16/2018 07:49   Scheduled Meds: . sodium chloride   Intravenous Once  . sodium chloride   Intravenous Once  . acetylcysteine  4 mL Nebulization BID  . Chlorhexidine Gluconate Cloth  6 each Topical Daily  . famotidine  20 mg Oral BID  . feeding supplement (ENSURE ENLIVE)  237 mL Oral TID BM  . feeding supplement (PRO-STAT SUGAR FREE 64)  30 mL Oral TID BM  . [START ON 02/17/2018] furosemide  40 mg Oral Daily  . guaiFENesin  1,200 mg Oral BID  . ipratropium-albuterol  3 mL Nebulization TID  . levothyroxine  150 mcg Oral Q0600  . loratadine  10 mg Oral Daily  . mouth rinse  15 mL Mouth Rinse BID  .  methylPREDNISolone (SOLU-MEDROL) injection  40 mg Intravenous Daily  . multivitamin with minerals  1 tablet Oral Daily  . pantoprazole  40 mg Oral Daily  . polyethylene glycol  17 g Oral Daily  . [START ON 02/17/2018] potassium chloride  40 mEq Oral Daily  . senna  1 tablet Oral BID  . sertraline  50 mg Oral Daily   Continuous Infusions: . vancomycin 750 mg (02/15/18 2348)    Active Problems:   Hurthle cell neoplasm of thyroid   Paraplegia at T4 level (HCC)   Constipation   Constipation due to neurogenic bowel   Closed fracture of left distal femur (HCC)   Pressure injury of skin   Anemia   Chronic anticoagulation   Fecal  impaction ([place])   HCAP (healthcare-associated pneumonia)   Community acquired pneumonia   Palliative care by specialist   Goals of care, counseling/discussion   DNR (do not resuscitate) discussion  Time spent: 24 mins  Irwin Brakeman, MD [facility] Pager [phone]  If 7PM-7AM, please contact night-coverage [email or web address] Password [email or web address] 02/16/2018, 12:03 PM    LOS: 12 days

## 2018-02-17 LAB — BASIC METABOLIC PANEL
ANION GAP: 6 (ref 5–15)
BUN: 27 mg/dL — ABNORMAL HIGH (ref 6–20)
CALCIUM: 7.3 mg/dL — AB (ref 8.9–10.3)
CO2: 33 mmol/L — ABNORMAL HIGH (ref 22–32)
Chloride: 100 mmol/L (ref 98–111)
Creatinine, Ser: 1 mg/dL (ref 0.44–1.00)
Glucose, Bld: 109 mg/dL — ABNORMAL HIGH (ref 70–99)
Potassium: 3.4 mmol/L — ABNORMAL LOW (ref 3.5–5.1)
SODIUM: 139 mmol/L (ref 135–145)

## 2018-02-17 LAB — MAGNESIUM: MAGNESIUM: 2.1 mg/dL (ref 1.7–2.4)

## 2018-02-17 MED ORDER — FUROSEMIDE 40 MG PO TABS
40.0000 mg | ORAL_TABLET | ORAL | Status: DC
  Administered 2018-02-19 – 2018-02-23 (×3): 40 mg via ORAL
  Filled 2018-02-17 (×4): qty 1

## 2018-02-17 MED ORDER — POTASSIUM CHLORIDE CRYS ER 20 MEQ PO TBCR
60.0000 meq | EXTENDED_RELEASE_TABLET | Freq: Every day | ORAL | Status: DC
  Administered 2018-02-17: 60 meq via ORAL
  Filled 2018-02-17: qty 3

## 2018-02-17 MED ORDER — POTASSIUM CHLORIDE CRYS ER 20 MEQ PO TBCR
40.0000 meq | EXTENDED_RELEASE_TABLET | ORAL | Status: DC
  Administered 2018-02-19 – 2018-02-23 (×3): 40 meq via ORAL
  Filled 2018-02-17 (×5): qty 2

## 2018-02-17 MED ORDER — SODIUM CHLORIDE 0.9 % IV SOLN
100.0000 mg | Freq: Two times a day (BID) | INTRAVENOUS | Status: AC
  Administered 2018-02-17 – 2018-02-20 (×6): 100 mg via INTRAVENOUS
  Filled 2018-02-17 (×11): qty 100

## 2018-02-17 NOTE — Progress Notes (Signed)
PROGRESS NOTE  Sarah [name]  EYC:[id number]  DOB: 08/13/1965  DOA: 02/03/2018 PCP: [name], MD  Brief Admission Hx: 52 y.o. 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  Patient states that symptoms have been going on for past 3 days.  She also complains of nausea but no vomiting.  Denies any fever.  Denies chest pain.  Pt was admitted with a multifocal pneumonia.  Newly discovered brain mets seen on CT brain.   MDM/Assessment & Plan:   1. Healthcare associated pneumonia-chest x-ray shows masslike opacity, likely infectious as it developed quickly since December 13, 2017.  CT confirms a multifocal pneumonia.  Pt was initially started on ceftriaxone and Zithromax but given the severity of infection and recent postop status, coverage was broadened to cefepime/vancomycin.  MRSA (+).   Blood cultures NGTD.  Strep pneumo and legionella antigens negative.  Progress has been slow because she has a very weak cough reflex and poor inspiratory effort.  Continue mucomyst nebs. Weaning steroids.  Added chest physiotherapy with percussion.  CXR 11/22 shows progression, and added back vancomycin, continue cefepime.  Consulted pulmonary team to assist with management. Continue lasix. Echo with normal EF.  De-escalate antibiotics doxycycline 11/27.    2. History of pulmonary embolism-continue Xarelto for full anticoagulation.  3. 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 Pt has been treated with radiation and chemotherapy.  Pt has been followed by Neurosurgeon Dr. Christella Noa and oncologist [name].  4. Newly discovered Brain Mets - Pt's CT head shows metastases to brain with dural involvement, MRI recommended, unsure if patient  could tolerate MRI.  I spoke with and updated her oncologist 11/25 per patient request.  Palliative medicine consult helping to address goals of care.  Pt desires to continue full scope treatment.   4. Hypothyroidism-continue Synthroid.  5. Hypotension -Resolved now.  Soft blood pressures likely multifactorial from dehydration, infection, anemia, paraplegia.  Pt is asymptomatic and MAP>65.    6. Anemia of chronic disease - Transfused 2 units PRBC 11/15 with improvement of hemoglobin.  Appreciate GI consult and assistance.  No gross GI bleeding. CT abd/pelvis negative for retroperitoneal bleed or hematoma.  Hg up to 9 after transfusion.  Hg on 11/25 back down to 7.4 and patient is symptomatic. GI has seen and don't plan for endoscopy at this time given patient's current condition, transfused additional 2 units PRBC 11/25.  Pt has recently tested hemoccult negative during this admission and seen by GI.  No source of bleeding found, resume xarelto.     7. Hypokalemia -replaced.  Magnesium is normal. 8. Stage 2 sacral pressure injury and stage 1 left heel injury - improving, continue wound care protocol. Consulted WOC nurse.  9. Fecal impaction - Treated and Resolved.  Was seen on CT abdomen/pelvis -feels better after receiving enemas.  Continue daily laxatives.  10. Anasarca.  Related to hypoalbuminemia.  Started on Lasix.  Patient has had good urine output.  Dietitian consulted to assist with protein supplementation.  11. Pulmonary edema seen on CXR 11/22.  Treated with IV lasix.  Echocardiogram with normal EF and no diastolic dysfunction. No CHF suspected.     DVT Prophylaxis-  Xarelto  Family Communication:  Discussed with family that patient appears to be dying and I  strongly advised them to meet with the palliative care team to help [place] plan on code status.  Pt desires to continue Full Scope treatment.   Code Status:  Full code, palliative care consulted to help address further goals of care.   Family requesting meeting on 11/26 for goals of care.   Consultants:  GI  Palliative care (unable to see patient until 11/25) Family requesting goals of care meeting for 11/26  Antimicrobials:  Ceftriaxone 11/14 >11/16  Azithromycin 11/14>11/16  Vancomycin 11/16 >11/19, 11/22-  Cefepime 11/16 >11/26  Doxycycline 11/27 >   Pressure Injury Documentation: Pressure Injury 02/04/18 Stage II -  Partial thickness loss of dermis presenting as a shallow open ulcer with a red, pink wound bed without slough. (Active)  02/04/18 0130   Location: Sacrum  Location Orientation: Mid  Staging: Stage II -  Partial thickness loss of dermis presenting as a shallow open ulcer with a red, pink wound bed without slough.  Wound Description (Comments):   Present on Admission: Yes     Pressure Injury 02/04/18 Stage I -  Intact skin with non-blanchable redness of a localized area usually over a bony prominence. (Active)  02/04/18 0120   Location: Heel  Location Orientation: Left  Staging: Stage I -  Intact skin with non-blanchable redness of a localized area usually over a bony prominence.  Wound Description (Comments):   Present on Admission: Yes   Echocardiogram 02/12/18 Study Conclusions  - Left ventricle: The cavity size was normal. Wall thickness was   increased in a pattern of mild LVH. Systolic function was normal.   The estimated ejection fraction was in the range of 55% to 60%.   Wall motion was normal; there were no regional wall motion   abnormalities. Left ventricular diastolic function parameters   were normal. - Aortic valve: Valve area (Vmax): 1.78 cm^2. - Atrial septum: No defect or patent foramen ovale was identified.  Subjective: Pt says that she feels a little better today.  She was hot thru the night.   No fever recorded.     Objective: Vitals:   02/16/18 2005 02/16/18 2226 02/17/18 0652 02/17/18 0757  BP:  117/71 125/73   Pulse: 72 71 68   Resp: 18 (!) 24    Temp:   98.6 F (37 C) 98.3 F (36.8 C)   TempSrc:  Oral Oral   SpO2: 98% 99% 100% 98%  Weight:      Height:        Intake/Output Summary (Last 24 hours) at 02/17/2018 1323 Last data filed at 02/17/2018 1219 Gross per 24 hour  Intake 960 ml  Output -  Net 960 ml   Filed Weights   02/08/18 0500 02/10/18 0427 02/11/18 0500  Weight: 111.8 kg 111 kg 110.1 kg   REVIEW OF SYSTEMS  As per history otherwise all reviewed and reported negative  Exam:  General exam: chronically ill appearing female, Alert, awake, oriented x 3, appears pale and weak Respiratory system: coarse anterior congestion, rales slightly improved on the left, no change on the right side. No increased WOB. Cardiovascular system:normal s1,s2 sounds. No murmurs, rubs, gallops. Gastrointestinal system: Abdomen is nondistended, soft and nontender. No organomegaly or masses felt. Normal bowel sounds heard. Extremities: 1+ edema bilateral LEs unchanged.  Skin: No rashes, lesions or ulcers.  Neuro: paraplegic.   Psychiatry: anxious.    Data Reviewed: Basic Metabolic Panel: Recent Labs  Lab 02/12/18 0427 02/13/18 6314 02/14/18 9702 02/15/18 6378 02/16/18 5885 02/17/18 0445  NA 139 139 139 140 140 139  K 4.1 3.7 3.6 3.5 3.2* 3.4*  CL 106 104 101 99 100 100  CO2 27 30 32 34* 34* 33*  GLUCOSE 127* 136* 119* 134* 85 109*  BUN 30* 31* 31* 31* 26* 27*  CREATININE 1.17* 1.19* 1.09* 1.03* 1.11* 1.00  CALCIUM 7.8* 7.5* 7.6* 7.4* 7.4* 7.3*  MG 2.4 2.4 2.3 2.2  --  2.1  PHOS 3.3  --   --   --   --   --    Liver Function Tests: Recent Labs  Lab 02/12/18 0427 02/13/18 0711 02/14/18 0644 02/15/18 0620 02/16/18 0608  AST 27 28 28 24 23   ALT 12 12 14 12 14   ALKPHOS 106 97 96 94 106  BILITOT 0.5 0.9 0.8 0.7 0.8  PROT 5.4* 5.3* 5.4* 5.1* 5.1*  ALBUMIN 1.7* 1.7* 1.8* 1.8* 1.8*   No results for input(s): LIPASE, AMYLASE in the last 168 hours. No results for input(s): AMMONIA in the last 168 hours. CBC: Recent Labs    Lab 02/11/18 0359 02/12/18 0427 02/13/18 0711 02/14/18 0644 02/15/18 0620 02/15/18 2008 02/16/18 0608  WBC 7.4 8.2 6.7 6.3 6.5  --  7.1  NEUTROABS 6.4  --  5.7 5.4 5.8  --  5.7  HGB 9.3* 9.7* 9.4* 9.3* 7.4* 11.8* 11.4*  HCT 30.2* 32.0* 30.9* 30.5* 23.8* 37.2 35.9*  MCV 91.0 92.8 90.9 89.7 90.2  --  89.5  PLT 145* 159 129* 124* 98*  --  115*   Cardiac Enzymes: No results for input(s): CKTOTAL, CKMB, CKMBINDEX, TROPONINI in the last 168 hours. CBG (last 3)  No results for input(s): GLUCAP in the last 72 hours. No results found for this or any previous visit (from the past 240 hour(s)).   Studies: Ct Chest W Contrast  Result Date: 02/15/2018 CLINICAL DATA:  Acute blood-loss anemia. Concern for retroperitoneal bleed. Metastatic thyroid cancer. Abdominal distension. EXAM: CT CHEST, ABDOMEN AND PELVIS WITHOUT CONTRAST TECHNIQUE: Multidetector CT imaging of the chest, abdomen and pelvis was performed following the standard protocol without IV contrast. COMPARISON:  Abdominopelvic CT 10 days ago 02/05/2018. Thoracic spine CT 10/26/2017 PET CT 05/20/2017 FINDINGS: CT CHEST FINDINGS Cardiovascular: Right upper extremity PICC with tip in the distal SVC. No central pulmonary embolus. Thoracic aorta is normal in caliber. No evidence of dissection. Normal heart size. No pericardial effusion. Mediastinum/Nodes: Supraclavicular regions partially obscured by patient's arms. No enlarged mediastinal or hilar lymph nodes. Thyroid gland is not well visualized and is likely surgically absent. The esophagus is decompressed. Lungs/Pleura: Moderate bilateral pleural effusions, slightly increased in size from included portion of abdominal CT 02/05/2018. Fluid measures simple density without evidence of hemothorax. There are multifocal patchy opacities throughout all lobes of both lungs. Septal thickening in the lower lobes. Biapical consolidations with bronchiectasis. Retained mucus or debris in the dependent  trachea and right mainstem bronchus. Pulmonary nodules on prior PET are obscured by ground-glass opacities and apical consolidations. Musculoskeletal: Known T3 metastatic disease with posterior fusion T1 through T5, better assessed on prior thoracic myelography. Lesion involves the posterior aspect of adjacent T2 and T4 vertebral bodies with severe T3 compression fracture as before. Spinal canal involvement not well assessed on the current exam. No new compression fracture. CT ABDOMEN PELVIS FINDINGS Hepatobiliary: No focal hepatic lesion. Postcholecystectomy with clips in the gallbladder fossa. Persistent fluid in the operative bed with slightly complex fluid but resolution of air. No peripheral enhancement. No other perihepatic fluid. No biliary dilatation. Pancreas:  Negative.  No ductal dilatation or inflammation. Spleen: Normal in size without focal abnormality. No perisplenic fluid. Adrenals/Urinary Tract: Normal adrenal glands. No hydronephrosis or perinephric edema. Homogeneous renal enhancement with symmetric excretion on delayed phase imaging. Urinary bladder is physiologically distended without wall thickening. Stomach/Bowel: Slight rectal wall thickening. No other bowel wall thickening, inflammatory change or obstruction. Enteric contrast reaches the colon. Sigmoid colonic redundancy as before. Stomach is physiologically distended. Normal appendix. No mesenteric hematoma. Vascular/Lymphatic: No retroperitoneal stranding or hemorrhage. Abdominal aorta and IVC are intact. No enlarged abdominal or pelvic lymph nodes. Reproductive: Status post hysterectomy. No adnexal masses. Small amount of free fluid in the pelvis, slightly diminished from prior. Other: Small amount of free fluid in the pelvis measures simple fluid density. No evidence of hemoperitoneum. Subcutaneous edema in the abdominal wall/flanks. No free air. Musculoskeletal: Multilevel degenerative change in the lumbar spine. Left proximal femur  hardware is partially included. No obvious osseous lesion. Spinal canal a soft tissue density at L1-L2 on prior myelography is not well seen currently. IMPRESSION: 1. No evidence of active bleeding in the chest, abdomen, or pelvis. No retroperitoneal bleed. 2. Moderate bilateral pleural effusions. Multifocal ground-glass opacities throughout both lungs may be pulmonary edema or pneumonia. Mild septal thickening suggests an element of pulmonary edema. 3. Biapical consolidations with bronchiectasis may represent postradiation change, recommend correlation with treatment history. 4. 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 5. Recent cholecystectomy with small amount of residual complex fluid in the operative bed. This may represent postoperative seroma, there is no peripheral enhancement to suggest superimposed infection. Biloma is not entirely excluded, however lack of significant increase compared to CT 10 days ago argues against this, and there is no other perihepatic fluid. 6. Small amount of free fluid in the pelvis, slightly diminished from prior exam. 7. Confluent edema in the body wall/flanks, similar to prior. Electronically Signed   By: Keith Rake M.D.   On: 02/15/2018 23:31   Ct Abdomen Pelvis W Contrast  Result Date: 02/15/2018 CLINICAL DATA:  Acute blood-loss anemia. Concern for retroperitoneal bleed. Metastatic thyroid cancer. Abdominal distension. EXAM: CT CHEST, ABDOMEN AND PELVIS WITHOUT CONTRAST TECHNIQUE: Multidetector CT imaging of the chest, abdomen and pelvis was performed following the standard protocol without IV contrast. COMPARISON:  Abdominopelvic CT 10 days ago 02/05/2018. Thoracic spine CT 10/26/2017 PET CT 05/20/2017 FINDINGS: CT CHEST FINDINGS Cardiovascular: Right upper extremity PICC with tip in the distal SVC. No central pulmonary embolus. Thoracic aorta is normal in caliber. No evidence of dissection.  Normal heart size. No pericardial effusion. Mediastinum/Nodes: Supraclavicular regions partially obscured by patient's arms. No enlarged mediastinal or hilar lymph nodes. Thyroid gland is not well visualized and is likely surgically absent. The esophagus is decompressed. Lungs/Pleura: Moderate bilateral pleural effusions, slightly increased in size from included portion of abdominal CT 02/05/2018. Fluid measures simple density without evidence of hemothorax. There are multifocal patchy opacities throughout all lobes of both lungs. Septal thickening in the lower lobes. Biapical consolidations with bronchiectasis. Retained mucus or debris in the dependent trachea and right mainstem bronchus. Pulmonary nodules on prior PET are obscured by ground-glass opacities and apical consolidations. Musculoskeletal: Known T3 metastatic disease with posterior fusion T1 through T5, better assessed on prior thoracic myelography. Lesion involves the posterior aspect of adjacent T2 and T4 vertebral bodies with severe T3 compression fracture as before. Spinal canal involvement not well assessed on the current exam. No new compression fracture. CT ABDOMEN PELVIS FINDINGS  Hepatobiliary: No focal hepatic lesion. Postcholecystectomy with clips in the gallbladder fossa. Persistent fluid in the operative bed with slightly complex fluid but resolution of air. No peripheral enhancement. No other perihepatic fluid. No biliary dilatation. Pancreas: Negative.  No ductal dilatation or inflammation. Spleen: Normal in size without focal abnormality. No perisplenic fluid. Adrenals/Urinary Tract: Normal adrenal glands. No hydronephrosis or perinephric edema. Homogeneous renal enhancement with symmetric excretion on delayed phase imaging. Urinary bladder is physiologically distended without wall thickening. Stomach/Bowel: Slight rectal wall thickening. No other bowel wall thickening, inflammatory change or obstruction. Enteric contrast reaches the  colon. Sigmoid colonic redundancy as before. Stomach is physiologically distended. Normal appendix. No mesenteric hematoma. Vascular/Lymphatic: No retroperitoneal stranding or hemorrhage. Abdominal aorta and IVC are intact. No enlarged abdominal or pelvic lymph nodes. Reproductive: Status post hysterectomy. No adnexal masses. Small amount of free fluid in the pelvis, slightly diminished from prior. Other: Small amount of free fluid in the pelvis measures simple fluid density. No evidence of hemoperitoneum. Subcutaneous edema in the abdominal wall/flanks. No free air. Musculoskeletal: Multilevel degenerative change in the lumbar spine. Left proximal femur hardware is partially included. No obvious osseous lesion. Spinal canal a soft tissue density at L1-L2 on prior myelography is not well seen currently. IMPRESSION: 1. No evidence of active bleeding in the chest, abdomen, or pelvis. No retroperitoneal bleed. 2. Moderate bilateral pleural effusions. Multifocal ground-glass opacities throughout both lungs may be pulmonary edema or pneumonia. Mild septal thickening suggests an element of pulmonary edema. 3. Biapical consolidations with bronchiectasis may represent postradiation change, recommend correlation with treatment history. 4. 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 5. Recent cholecystectomy with small amount of residual complex fluid in the operative bed. This may represent postoperative seroma, there is no peripheral enhancement to suggest superimposed infection. Biloma is not entirely excluded, however lack of significant increase compared to CT 10 days ago argues against this, and there is no other perihepatic fluid. 6. Small amount of free fluid in the pelvis, slightly diminished from prior exam. 7. Confluent edema in the body wall/flanks, similar to prior. Electronically Signed   By: Keith Rake M.D.   On: 02/15/2018 23:31   Dg  Chest Port 1 View  Result Date: 02/16/2018 CLINICAL DATA:  Follow-up multifocal pneumonia EXAM: PORTABLE CHEST 1 VIEW COMPARISON:  02/15/2018 FINDINGS: Cardiac shadow remains enlarged. Bilateral pleural effusions are again seen. Biapical consolidative changes are again identified and unchanged. No new focal infiltrate is seen. Postsurgical changes at the cervicothoracic junction are noted. Right-sided PICC line is seen in satisfactory position. IMPRESSION: Overall stable appearance of the chest with biapical consolidation. Electronically Signed   By: Inez Catalina M.D.   On: 02/16/2018 07:49   Scheduled Meds: . sodium chloride   Intravenous Once  . sodium chloride   Intravenous Once  . acetylcysteine  4 mL Nebulization BID  . Chlorhexidine Gluconate Cloth  6 each Topical Daily  . famotidine  20 mg Oral BID  . feeding supplement (ENSURE ENLIVE)  237 mL Oral TID BM  . feeding supplement (PRO-STAT SUGAR FREE 64)  30 mL Oral TID BM  . furosemide  40 mg Oral Daily  . guaiFENesin  1,200 mg Oral BID  . ipratropium-albuterol  3 mL Nebulization TID  . levothyroxine  150 mcg Oral Q0600  . loratadine  10 mg Oral Daily  . mouth rinse  15 mL Mouth Rinse BID  . methylPREDNISolone (SOLU-MEDROL) injection  20 mg Intravenous  Daily  . multivitamin with minerals  1 tablet Oral Daily  . pantoprazole  40 mg Oral Daily  . polyethylene glycol  17 g Oral Daily  . potassium chloride  60 mEq Oral Daily  . rivaroxaban  20 mg Oral Q supper  . senna  1 tablet Oral BID  . sertraline  50 mg Oral Daily   Continuous Infusions: . doxycycline (VIBRAMYCIN) IV      Active Problems:   Hurthle cell neoplasm of thyroid   Paraplegia at T4 level (HCC)   Constipation   Constipation due to neurogenic bowel   Closed fracture of left distal femur (HCC)   Pressure injury of skin   Anemia   Chronic anticoagulation   Fecal impaction (HCC)   HCAP (healthcare-associated pneumonia)   Community acquired pneumonia   Palliative  care by specialist   Goals of care, counseling/discussion   DNR (do not resuscitate) discussion   Dyspnea   Multifocal pneumonia  Time spent: 31 mins  Irwin Brakeman, MD [facility] Pager [phone]  If 7PM-7AM, please contact night-coverage [email or web address] Password [email or web address] 02/17/2018, 1:23 PM    LOS: 13 days

## 2018-02-17 NOTE — Progress Notes (Addendum)
Nutrition Follow-up  DOCUMENTATION CODES:  Obesity unspecified  INTERVENTION:  Text paged MD recommending diet liberalization. ->>She is now on a regular diet.   Will order Magic Cup 1x a day for variety purposes  Took food requests to promote intake. Will add to trays.   Gave a few brief diet tips to increase protein intake: ie eat protein first at each meal, always order meat with meals.   Please note- though she is only eating 25-50% of meals, she is receiving 1125 kcals and 87.5g Pro from supplements. When paired w/ her meal intake, feel she is actually able to meet 80-90% of needs  NUTRITION DIAGNOSIS:  Increased nutrient needs related to wound healing, cancer and cancer related treatments, acute illness(HCAP) as evidenced by the estimated nutrition requirements for this condtion   GOAL:  Patient will meet greater than or equal to 90% of their needs   Believed met, or close to met  MONITOR:  PO intake, Supplement acceptance, Diet advancement, Labs, Weight trends, Skin    ASSESSMENT:  52 y/o female PMHx Metastatic thyroid cancer s/p total thyroidectomy 2015. 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 She presented to hospital 11/13 d/t SOB x3 days. She was worked up for CAP and admitted for treatment.   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 Palliative medicine has met with patient/family, [place] discussions have been ongoing and, as of now, patient desires  full scope/aggressive care. _______________  Reviewed patients intake records. Pt has been averaging 25-50% meal completion. She has had only 2 documented intakes of >50% and several documented intakes of <25%. She has been noted in chart to have early satiety and poor appetite. She has been receiving Ensure Enlive and Prostat, both ordered TID. She  appears to be averaging 2.5 Ensures and 2.5 serving of prostat per day.   Today on RD arrival, pt is up, alert and pleasant. She is preparing to eat Lunch which is a large chef salad. She has prostat and Ensure on her table as well. She says she is usually able to drink 2 Ensures a day. She says she does drink the 3 servings of Prostat each day, though she greatly dislikes it.   She says the main barrier to intake is her frank lack of appetite. She denies n/v. She is unsure about constipation or diarrhea. She says she feels like she needs to have a BM all the time due to her spinal compression. She believes she had a BM yesterday.   Noted patient is on a heart healthy diet. She says resoundingly "yes". Feel this is appropriate. Will ask MD.  RD asked about any items that could be added to her tray that she would be apt to eat. Only items she said yes to was baked chips. She does state she like orange sherbert-will try orange magic cup for variety. From chart, pts family seems to be extremely supportive. As the hospital is limited in what it can provide food-wise, RD highly recommended she ask her family to bring in any outside items she would like.   Pt's last weight was 242.75 lbs on 11/21. She reports UBW of 220. Her admit weight is stated as 398 lbs, but this is surely a misweight or entry error. Bed weight today is 227.8 lbs  Pt is tentatively planned to D/C on Friday.  RD reviewed some basic recommendations to help her stay well nourished after d/c- always order protein containing foods with meals (meat),  always eat protein first at meals, eat small amounts throughout the day.  She is appreciate and reports understanding.   Labs: k:3.4, BUN/creat: 27/1.0 (stable), Hgb: 7.4-> 11.4. Albumin: 1.8 (stable) Meds: H2RA, PPI, Miralax, Xarleto, Senokot, IV abx, PRN Zofran/oxycodone/xanax used often.   Recent Labs  Lab 02/12/18 0427  02/14/18 0644 02/15/18 0620 02/16/18 0608 02/17/18 0445  NA 139    < > 139 140 140 139  K 4.1   < > 3.6 3.5 3.2* 3.4*  CL 106   < > 101 99 100 100  CO2 27   < > 32 34* 34* 33*  BUN 30*   < > 31* 31* 26* 27*  CREATININE 1.17*   < > 1.09* 1.03* 1.11* 1.00  CALCIUM 7.8*   < > 7.6* 7.4* 7.4* 7.3*  MG 2.4   < > 2.3 2.2  --  2.1  PHOS 3.3  --   --   --   --   --   GLUCOSE 127*   < > 119* 134* 85 109*   < > = values in this interval not displayed.   Diet Order:   Diet Order            Diet regular Room service appropriate? Yes; Fluid consistency: Thin  Diet effective now             EDUCATION NEEDS:  Education needs have been addressed  Skin:  Stage I: left heel Stage II: sacrum   Last BM:  11/26  Height:  Ht Readings from Last 1 Encounters:  02/04/18 5' 11" (1.803 m)   Weight:  Wt Readings from Last 1 Encounters:  02/17/18 103.3 kg   Wt Readings from Last 10 Encounters:  02/17/18 103.3 kg  01/25/18 100.2 kg  01/05/18 100.2 kg  11/06/17 107 kg  10/14/17 103.2 kg  09/16/17 107.9 kg  09/15/17 105.8 kg  09/09/17 105.3 kg  09/07/17 105.6 kg  08/09/17 96 kg   Ideal Body Weight:  63 kg(adjusted for paraplegia)  BMI:  Body mass index is 31.77 kg/m.  Estimated Nutritional Needs:  Kcal:  1900-2100 kcals (msj x 1.1-1.2) Protein:  113-126 gr (1.8-2.0 Adj IBW) Fluid:  1.9-2.1 L fluid (19m/kcal)  NBurtis JunesRD, LDN, CNSC Clinical Nutrition Available Tues-Sat via Pager: [phone]/27/2019 2:23 PM

## 2018-02-17 NOTE — Progress Notes (Signed)
Subjective: She says she feels a little better.  She is able to cough up some sputum.  She has no other new complaints.  Objective: Vital signs in last 24 hours: Temp:  [98.3 F (36.8 C)-99.2 F (37.3 C)] 98.3 F (36.8 C) (11/27 6468) Pulse Rate:  [68-72] 68 (11/27 0652) Resp:  [18-24] 24 (11/26 2226) BP: (117-141)/(71-84) 125/73 (11/27 0321) SpO2:  [98 %-100 %] 98 % (11/27 0757) Weight change:  Last BM Date: 02/16/18  Intake/Output from previous day: 11/26 0701 - 11/27 0700 In: 720 [P.O.:720] Out: -   PHYSICAL EXAM General appearance: alert, cooperative and no distress Resp: rhonchi bilaterally Cardio: regular rate and rhythm, S1, S2 normal, no murmur, click, rub or gallop GI: soft, non-tender; bowel sounds normal; no masses,  no organomegaly Extremities: extremities normal, atraumatic, no cyanosis or edema  Lab Results:  Results for orders placed or performed during the hospital encounter of 02/03/18 (from the past 48 hour(s))  Type and screen [facility]     Status: None   Collection Time: 02/15/18  9:58 AM  Result Value Ref Range   ABO/RH(D) O POS    Antibody Screen NEG    Sample Expiration 02/18/2018    Unit Number [id number]    Blood Component Type RED CELLS,LR    Unit division 00    Status of Unit ISSUED,FINAL    Transfusion Status OK TO TRANSFUSE    Crossmatch Result      Compatible Performed at [facility], [address], [place], [place] [address]    Unit Number [id number]    Blood Component Type RBC LR PHER2    Unit division 00    Status of Unit ISSUED,FINAL    Transfusion Status OK TO TRANSFUSE    Crossmatch Result Compatible   Prepare RBC     Status: None   Collection Time: 02/15/18 10:01 AM  Result Value Ref Range   Order Confirmation      ORDER PROCESSED BY BLOOD BANK Performed at [facility], [address] [place], [place] [address]   Hemoglobin and hematocrit, blood     Status: Abnormal   Collection Time: 02/15/18  8:08 PM   Result Value Ref Range   Hemoglobin 11.8 (L) 12.0 - 15.0 g/dL    Comment: POST TRANSFUSION SPECIMEN   HCT 37.2 36.0 - 46.0 %    Comment: Performed at [facility], [address] [place], [place] [address]  Comprehensive metabolic panel     Status: Abnormal   Collection Time: 02/16/18  6:08 AM  Result Value Ref Range   Sodium 140 135 - 145 mmol/L   Potassium 3.2 (L) 3.5 - 5.1 mmol/L   Chloride 100 98 - 111 mmol/L   CO2 34 (H) 22 - 32 mmol/L   Glucose, Bld 85 70 - 99 mg/dL   BUN 26 (H) 6 - 20 mg/dL   Creatinine, Ser 1.11 (H) 0.44 - 1.00 mg/dL   Calcium 7.4 (L) 8.9 - 10.3 mg/dL   Total Protein 5.1 (L) 6.5 - 8.1 g/dL   Albumin 1.8 (L) 3.5 - 5.0 g/dL   AST 23 15 - 41 U/L   ALT 14 0 - 44 U/L   Alkaline Phosphatase 106 38 - 126 U/L   Total Bilirubin 0.8 0.3 - 1.2 mg/dL   GFR calc non Af Amer 56 (L) >60 mL/min   GFR calc Af Amer >60 >60 mL/min    Comment: (NOTE) The eGFR has been calculated using the CKD EPI equation.  This calculation has not been validated in all clinical situations. eGFR's persistently <60 mL/min signify possible Chronic Kidney Disease.    Anion gap 6 5 - 15    Comment: Performed at [facility], [address] [place], [place] [address]  CBC with Differential/Platelet     Status: Abnormal   Collection Time: 02/16/18  6:08 AM  Result Value Ref Range   WBC 7.1 4.0 - 10.5 K/uL   RBC 4.01 3.87 - 5.11 MIL/uL   Hemoglobin 11.4 (L) 12.0 - 15.0 g/dL   HCT 35.9 (L) 36.0 - 46.0 %   MCV 89.5 80.0 - 100.0 fL   MCH 28.4 26.0 - 34.0 pg   MCHC 31.8 30.0 - 36.0 g/dL   RDW 15.7 (H) 11.5 - 15.5 %   Platelets 115 (L) 150 - 400 K/uL    Comment: SPECIMEN CHECKED FOR CLOTS Immature Platelet Fraction may be clinically indicated, consider ordering this additional test [id number] CRITICAL VALUE NOTED.  VALUE IS CONSISTENT WITH PREVIOUSLY REPORTED AND CALLED VALUE.    nRBC 0.0 0.0 - 0.2 %   Neutrophils Relative % 80 %   Neutro Abs 5.7 1.7 - 7.7 K/uL   Lymphocytes Relative 11  %   Lymphs Abs 0.8 0.7 - 4.0 K/uL   Monocytes Relative 4 %   Monocytes Absolute 0.3 0.1 - 1.0 K/uL   Eosinophils Relative 4 %   Eosinophils Absolute 0.3 0.0 - 0.5 K/uL   Basophils Relative 0 %   Basophils Absolute 0.0 0.0 - 0.1 K/uL   Immature Granulocytes 1 %   Abs Immature Granulocytes 0.10 (H) 0.00 - 0.07 K/uL    Comment: Performed at [facility], [address] [place], [place] [address]  Basic metabolic panel     Status: Abnormal   Collection Time: 02/17/18  4:45 AM  Result Value Ref Range   Sodium 139 135 - 145 mmol/L   Potassium 3.4 (L) 3.5 - 5.1 mmol/L   Chloride 100 98 - 111 mmol/L   CO2 33 (H) 22 - 32 mmol/L   Glucose, Bld 109 (H) 70 - 99 mg/dL   BUN 27 (H) 6 - 20 mg/dL   Creatinine, Ser 1.00 0.44 - 1.00 mg/dL   Calcium 7.3 (L) 8.9 - 10.3 mg/dL   GFR calc non Af Amer >60 >60 mL/min   GFR calc Af Amer >60 >60 mL/min   Anion gap 6 5 - 15    Comment: Performed at [facility], [address] [place], [place] [address]  Magnesium     Status: None   Collection Time: 02/17/18  4:45 AM  Result Value Ref Range   Magnesium 2.1 1.7 - 2.4 mg/dL    Comment: Performed at [facility], [address] [place], [place] [address]   *Note: Due to a large number of results and/or encounters for the requested time period, some results have not been displayed. A complete set of results can be found in Results Review.    ABGS No results for input(s): PHART, PO2ART, TCO2, HCO3 in the last 72 hours.  Invalid input(s): PCO2 CULTURES No results found for this or any previous visit (from the past 240 hour(s)). Studies/Results: Ct Chest W Contrast  Result Date: 02/15/2018 CLINICAL DATA:  Acute blood-loss anemia. Concern for retroperitoneal bleed. Metastatic thyroid cancer. Abdominal distension. EXAM: CT CHEST, ABDOMEN AND PELVIS WITHOUT CONTRAST TECHNIQUE: Multidetector CT imaging of the chest, abdomen and pelvis was performed following the standard protocol without IV contrast.  COMPARISON:  Abdominopelvic CT  10 days ago 02/05/2018. Thoracic spine CT 10/26/2017 PET CT 05/20/2017 FINDINGS: CT CHEST FINDINGS Cardiovascular: Right upper extremity PICC with tip in the distal SVC. No central pulmonary embolus. Thoracic aorta is normal in caliber. No evidence of dissection. Normal heart size. No pericardial effusion. Mediastinum/Nodes: Supraclavicular regions partially obscured by patient's arms. No enlarged mediastinal or hilar lymph nodes. Thyroid gland is not well visualized and is likely surgically absent. The esophagus is decompressed. Lungs/Pleura: Moderate bilateral pleural effusions, slightly increased in size from included portion of abdominal CT 02/05/2018. Fluid measures simple density without evidence of hemothorax. There are multifocal patchy opacities throughout all lobes of both lungs. Septal thickening in the lower lobes. Biapical consolidations with bronchiectasis. Retained mucus or debris in the dependent trachea and right mainstem bronchus. Pulmonary nodules on prior PET are obscured by ground-glass opacities and apical consolidations. Musculoskeletal: Known T3 metastatic disease with posterior fusion T1 through T5, better assessed on prior thoracic myelography. Lesion involves the posterior aspect of adjacent T2 and T4 vertebral bodies with severe T3 compression fracture as before. Spinal canal involvement not well assessed on the current exam. No new compression fracture. CT ABDOMEN PELVIS FINDINGS Hepatobiliary: No focal hepatic lesion. Postcholecystectomy with clips in the gallbladder fossa. Persistent fluid in the operative bed with slightly complex fluid but resolution of air. No peripheral enhancement. No other perihepatic fluid. No biliary dilatation. Pancreas: Negative.  No ductal dilatation or inflammation. Spleen: Normal in size without focal abnormality. No perisplenic fluid. Adrenals/Urinary Tract: Normal adrenal glands. No hydronephrosis or perinephric edema.  Homogeneous renal enhancement with symmetric excretion on delayed phase imaging. Urinary bladder is physiologically distended without wall thickening. Stomach/Bowel: Slight rectal wall thickening. No other bowel wall thickening, inflammatory change or obstruction. Enteric contrast reaches the colon. Sigmoid colonic redundancy as before. Stomach is physiologically distended. Normal appendix. No mesenteric hematoma. Vascular/Lymphatic: No retroperitoneal stranding or hemorrhage. Abdominal aorta and IVC are intact. No enlarged abdominal or pelvic lymph nodes. Reproductive: Status post hysterectomy. No adnexal masses. Small amount of free fluid in the pelvis, slightly diminished from prior. Other: Small amount of free fluid in the pelvis measures simple fluid density. No evidence of hemoperitoneum. Subcutaneous edema in the abdominal wall/flanks. No free air. Musculoskeletal: Multilevel degenerative change in the lumbar spine. Left proximal femur hardware is partially included. No obvious osseous lesion. Spinal canal a soft tissue density at L1-L2 on prior myelography is not well seen currently. IMPRESSION: 1. No evidence of active bleeding in the chest, abdomen, or pelvis. No retroperitoneal bleed. 2. Moderate bilateral pleural effusions. Multifocal ground-glass opacities throughout both lungs may be pulmonary edema or pneumonia. Mild septal thickening suggests an element of pulmonary edema. 3. Biapical consolidations with bronchiectasis may represent postradiation change, recommend correlation with treatment history. 4. 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 5. Recent cholecystectomy with small amount of residual complex fluid in the operative bed. This may represent postoperative seroma, there is no peripheral enhancement to suggest superimposed infection. Biloma is not entirely excluded, however lack of significant increase compared to CT 10  days ago argues against this, and there is no other perihepatic fluid. 6. Small amount of free fluid in the pelvis, slightly diminished from prior exam. 7. Confluent edema in the body wall/flanks, similar to prior. Electronically Signed   By: Keith Rake M.D.   On: 02/15/2018 23:31   Ct Abdomen Pelvis W Contrast  Result Date: 02/15/2018 CLINICAL DATA:  Acute blood-loss anemia. Concern for retroperitoneal  bleed. Metastatic thyroid cancer. Abdominal distension. EXAM: CT CHEST, ABDOMEN AND PELVIS WITHOUT CONTRAST TECHNIQUE: Multidetector CT imaging of the chest, abdomen and pelvis was performed following the standard protocol without IV contrast. COMPARISON:  Abdominopelvic CT 10 days ago 02/05/2018. Thoracic spine CT 10/26/2017 PET CT 05/20/2017 FINDINGS: CT CHEST FINDINGS Cardiovascular: Right upper extremity PICC with tip in the distal SVC. No central pulmonary embolus. Thoracic aorta is normal in caliber. No evidence of dissection. Normal heart size. No pericardial effusion. Mediastinum/Nodes: Supraclavicular regions partially obscured by patient's arms. No enlarged mediastinal or hilar lymph nodes. Thyroid gland is not well visualized and is likely surgically absent. The esophagus is decompressed. Lungs/Pleura: Moderate bilateral pleural effusions, slightly increased in size from included portion of abdominal CT 02/05/2018. Fluid measures simple density without evidence of hemothorax. There are multifocal patchy opacities throughout all lobes of both lungs. Septal thickening in the lower lobes. Biapical consolidations with bronchiectasis. Retained mucus or debris in the dependent trachea and right mainstem bronchus. Pulmonary nodules on prior PET are obscured by ground-glass opacities and apical consolidations. Musculoskeletal: Known T3 metastatic disease with posterior fusion T1 through T5, better assessed on prior thoracic myelography. Lesion involves the posterior aspect of adjacent T2 and T4  vertebral bodies with severe T3 compression fracture as before. Spinal canal involvement not well assessed on the current exam. No new compression fracture. CT ABDOMEN PELVIS FINDINGS Hepatobiliary: No focal hepatic lesion. Postcholecystectomy with clips in the gallbladder fossa. Persistent fluid in the operative bed with slightly complex fluid but resolution of air. No peripheral enhancement. No other perihepatic fluid. No biliary dilatation. Pancreas: Negative.  No ductal dilatation or inflammation. Spleen: Normal in size without focal abnormality. No perisplenic fluid. Adrenals/Urinary Tract: Normal adrenal glands. No hydronephrosis or perinephric edema. Homogeneous renal enhancement with symmetric excretion on delayed phase imaging. Urinary bladder is physiologically distended without wall thickening. Stomach/Bowel: Slight rectal wall thickening. No other bowel wall thickening, inflammatory change or obstruction. Enteric contrast reaches the colon. Sigmoid colonic redundancy as before. Stomach is physiologically distended. Normal appendix. No mesenteric hematoma. Vascular/Lymphatic: No retroperitoneal stranding or hemorrhage. Abdominal aorta and IVC are intact. No enlarged abdominal or pelvic lymph nodes. Reproductive: Status post hysterectomy. No adnexal masses. Small amount of free fluid in the pelvis, slightly diminished from prior. Other: Small amount of free fluid in the pelvis measures simple fluid density. No evidence of hemoperitoneum. Subcutaneous edema in the abdominal wall/flanks. No free air. Musculoskeletal: Multilevel degenerative change in the lumbar spine. Left proximal femur hardware is partially included. No obvious osseous lesion. Spinal canal a soft tissue density at L1-L2 on prior myelography is not well seen currently. IMPRESSION: 1. No evidence of active bleeding in the chest, abdomen, or pelvis. No retroperitoneal bleed. 2. Moderate bilateral pleural effusions. Multifocal ground-glass  opacities throughout both lungs may be pulmonary edema or pneumonia. Mild septal thickening suggests an element of pulmonary edema. 3. Biapical consolidations with bronchiectasis may represent postradiation change, recommend correlation with treatment history. 4. 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 5. Recent cholecystectomy with small amount of residual complex fluid in the operative bed. This may represent postoperative seroma, there is no peripheral enhancement to suggest superimposed infection. Biloma is not entirely excluded, however lack of significant increase compared to CT 10 days ago argues against this, and there is no other perihepatic fluid. 6. Small amount of free fluid in the pelvis, slightly diminished from prior exam. 7. Confluent edema in the body wall/flanks,  similar to prior. Electronically Signed   By: Keith Rake M.D.   On: 02/15/2018 23:31   Dg Chest Port 1 View  Result Date: 02/16/2018 CLINICAL DATA:  Follow-up multifocal pneumonia EXAM: PORTABLE CHEST 1 VIEW COMPARISON:  02/15/2018 FINDINGS: Cardiac shadow remains enlarged. Bilateral pleural effusions are again seen. Biapical consolidative changes are again identified and unchanged. No new focal infiltrate is seen. Postsurgical changes at the cervicothoracic junction are noted. Right-sided PICC line is seen in satisfactory position. IMPRESSION: Overall stable appearance of the chest with biapical consolidation. Electronically Signed   By: Inez Catalina M.D.   On: 02/16/2018 07:49    Medications:  Prior to Admission:  Medications Prior to Admission  Medication Sig Dispense Refill Last Dose  . acetaminophen (TYLENOL) 325 MG tablet Take 2 tablets (650 mg total) by mouth every 4 (four) hours as needed for mild pain ((score 1 to 3) or temp > 100.5).   Past Month at Unknown time  . baclofen (LIORESAL) 10 MG tablet Take 25 mg by mouth 4 (four) times daily as  needed for muscle spasms.    02/03/2018 at Unknown time  . famotidine (PEPCID) 20 MG tablet Take 1 tablet (20 mg total) by mouth 2 (two) times daily. 60 tablet 0 Past Week at Unknown time  . fluticasone (FLONASE) 50 MCG/ACT nasal spray Place 2 sprays into both nostrils daily. 16 g 6 02/02/2018 at Unknown time  . levothyroxine (SYNTHROID, LEVOTHROID) 150 MCG tablet Take 150 mcg by mouth daily before breakfast.    02/02/2018 at Unknown time  . loratadine (CLARITIN) 10 MG tablet Take 1 tablet (10 mg total) by mouth daily.   Past Week at Unknown time  . ondansetron (ZOFRAN) 4 MG tablet Take 1 tablet by mouth every 6 (six) hours as needed for nausea or vomiting.    02/02/2018 at Unknown time  . oxyCODONE (ROXICODONE) 5 MG immediate release tablet Take 1 tablet (5 mg total) by mouth every 4 (four) hours as needed. 20 tablet 0 unknown  . oxyCODONE-acetaminophen (PERCOCET/ROXICET) 5-325 MG tablet Take 0.5-1 tablets by mouth every 6 (six) hours as needed for severe pain.   02/03/2018 at Unknown time  . pantoprazole (PROTONIX) 40 MG tablet Take 1 tablet by mouth daily.   Past Week at Unknown time  . polyethylene glycol (MIRALAX / GLYCOLAX) packet Take 17 g by mouth daily. 14 each 0 Past Week at Unknown time  . rivaroxaban (XARELTO) 20 MG TABS tablet Take 20 mg by mouth daily with supper.   02/02/2018 at 1700  . senna (SENOKOT) 8.6 MG TABS tablet Take 1 tablet (8.6 mg total) by mouth 2 (two) times daily. 120 each 0 Past Week at Unknown time  . sertraline (ZOLOFT) 50 MG tablet    02/02/2018 at Unknown time  . sucralfate (CARAFATE) 1 g tablet Take 1 tablet (1 g total) by mouth 4 (four) times daily -  with meals and at bedtime. Please dissolve tablet in 10-15 mL of water to create a slurry. Patient should drink 10-15 minutes before each meals and at bedtime to reduce painful swallowing. 120 tablet 2 02/02/2018 at Unknown time  . tiZANidine (ZANAFLEX) 2 MG tablet Take 2 mg by mouth every 8 (eight) hours as needed for  muscle spasms.    Past Month at Unknown time  . sodium phosphate (FLEET) 7-19 GM/118ML ENEM Place 133 mLs (1 enema total) rectally daily as needed for severe constipation. (Patient not taking: Reported on 01/05/2018)  0 More than a  month at Unknown time  . sucralfate (CARAFATE) 1 GM/10ML suspension Take 10 mLs (1 g total) by mouth 4 (four) times daily -  with meals and at bedtime. (Patient not taking: Reported on 02/03/2018) 420 mL 0 Not Taking at Unknown time   Scheduled: . sodium chloride   Intravenous Once  . sodium chloride   Intravenous Once  . acetylcysteine  4 mL Nebulization BID  . Chlorhexidine Gluconate Cloth  6 each Topical Daily  . famotidine  20 mg Oral BID  . feeding supplement (ENSURE ENLIVE)  237 mL Oral TID BM  . feeding supplement (PRO-STAT SUGAR FREE 64)  30 mL Oral TID BM  . furosemide  40 mg Oral Daily  . guaiFENesin  1,200 mg Oral BID  . ipratropium-albuterol  3 mL Nebulization TID  . levothyroxine  150 mcg Oral Q0600  . loratadine  10 mg Oral Daily  . mouth rinse  15 mL Mouth Rinse BID  . methylPREDNISolone (SOLU-MEDROL) injection  20 mg Intravenous Daily  . multivitamin with minerals  1 tablet Oral Daily  . pantoprazole  40 mg Oral Daily  . polyethylene glycol  17 g Oral Daily  . potassium chloride  40 mEq Oral Daily  . rivaroxaban  20 mg Oral Q supper  . senna  1 tablet Oral BID  . sertraline  50 mg Oral Daily   Continuous: . vancomycin 750 mg (02/16/18 2358)   GHW:EXHBZJIRCVELF, albuterol, ALPRAZolam, baclofen, dextromethorphan, ondansetron **OR** ondansetron (ZOFRAN) IV, oxyCODONE, promethazine, sodium chloride flush, sodium phosphate, tiZANidine  Assesment: She was admitted with healthcare associated pneumonia which is multifocal.  She has had trouble clearing her secretions but she is doing better with that now.  She still has significant rhonchi but is improved.  She has no other new complaints.  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  She is slowly improving    LOS: 13 days   Ulysees Robarts L 02/17/2018, 9:14 AM

## 2018-02-18 LAB — CBC WITH DIFFERENTIAL/PLATELET
ABS IMMATURE GRANULOCYTES: 0.07 10*3/uL (ref 0.00–0.07)
BASOS PCT: 0 %
Basophils Absolute: 0 10*3/uL (ref 0.0–0.1)
EOS PCT: 4 %
Eosinophils Absolute: 0.2 10*3/uL (ref 0.0–0.5)
HCT: 36.1 % (ref 36.0–46.0)
Hemoglobin: 11 g/dL — ABNORMAL LOW (ref 12.0–15.0)
Immature Granulocytes: 1 %
Lymphocytes Relative: 16 %
Lymphs Abs: 1 10*3/uL (ref 0.7–4.0)
MCH: 27.7 pg (ref 26.0–34.0)
MCHC: 30.5 g/dL (ref 30.0–36.0)
MCV: 90.9 fL (ref 80.0–100.0)
MONO ABS: 0.4 10*3/uL (ref 0.1–1.0)
MONOS PCT: 7 %
Neutro Abs: 4.6 10*3/uL (ref 1.7–7.7)
Neutrophils Relative %: 72 %
PLATELETS: 102 10*3/uL — AB (ref 150–400)
RBC: 3.97 MIL/uL (ref 3.87–5.11)
RDW: 16.1 % — ABNORMAL HIGH (ref 11.5–15.5)
WBC: 6.4 10*3/uL (ref 4.0–10.5)
nRBC: 0 % (ref 0.0–0.2)

## 2018-02-18 LAB — BASIC METABOLIC PANEL
Anion gap: 6 (ref 5–15)
BUN: 31 mg/dL — ABNORMAL HIGH (ref 6–20)
CALCIUM: 7.4 mg/dL — AB (ref 8.9–10.3)
CO2: 34 mmol/L — ABNORMAL HIGH (ref 22–32)
CREATININE: 1.14 mg/dL — AB (ref 0.44–1.00)
Chloride: 102 mmol/L (ref 98–111)
GFR calc Af Amer: >60 mL/min (ref >60)
GFR, EST NON AFRICAN AMERICAN: 56 mL/min — AB (ref >60)
GLUCOSE: 106 mg/dL — AB (ref 70–99)
Potassium: 3.5 mmol/L (ref 3.5–5.1)
SODIUM: 142 mmol/L (ref 135–145)

## 2018-02-18 MED ORDER — GUAIFENESIN 100 MG/5ML PO SOLN
15.0000 mL | Freq: Two times a day (BID) | ORAL | Status: DC
  Administered 2018-02-18: 100 mg via ORAL
  Administered 2018-02-18 – 2018-02-23 (×10): 300 mg via ORAL
  Filled 2018-02-18 (×2): qty 5
  Filled 2018-02-18: qty 15
  Filled 2018-02-18: qty 5
  Filled 2018-02-18: qty 10
  Filled 2018-02-18: qty 5
  Filled 2018-02-18: qty 15
  Filled 2018-02-18 (×2): qty 5
  Filled 2018-02-18: qty 10
  Filled 2018-02-18: qty 15
  Filled 2018-02-18: qty 5

## 2018-02-18 NOTE — Progress Notes (Signed)
PROGRESS NOTE  Sarah [name]  RUE:[id number]  DOB: [birth year]  DOA: 02/03/2018 PCP: [name], MD  Brief Admission Hx: 52 y.o. 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  Patient states that symptoms have been going on for past 3 days.  She also complains of nausea but no vomiting.  Denies any fever.  Denies chest pain.  Pt was admitted with a multifocal pneumonia.  Newly discovered brain mets seen on CT brain.   MDM/Assessment & Plan:   1. Healthcare associated pneumonia-Pt is clinically improving.  Chest x-ray shows masslike opacity, likely infectious as it developed quickly since December 13, 2017.  CT confirms a multifocal pneumonia.  Pt was initially started on ceftriaxone and Zithromax but given the severity of infection and recent postop status, coverage was broadened to cefepime/vancomycin.  MRSA (+).   Blood cultures no growth.  Strep pneumo and legionella antigens negative.  Progress has been slow because she has a very weak cough reflex and poor inspiratory effort but it is improving.  Continue mucomyst nebs. Weaning steroids.  Added chest physiotherapy with percussion.  Consulted pulmonary team to assist with management. Continue lasix. Echo with normal EF.  De-escalate antibiotics doxycycline 11/27.    2. History of pulmonary embolism-continue Xarelto for full anticoagulation.  3. 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 Pt has been treated with radiation and chemotherapy.  Pt has been followed by Neurosurgeon Dr. Christella Noa and oncologist [name].  4. Newly discovered Brain Mets - Pt's CT head shows metastases to brain with dural involvement, MRI recommended, patient has not been medically stable  enough to tolerate MRI.  I spoke with and updated her oncologist Dr Irene Limbo 11/25 per patient request.  Pt plans to follow up with him.  Palliative medicine consulted and pt desires to continue full scope treatment.   4. Hypothyroidism-continue Synthroid.  5. Hypotension -Resolved now.  Soft blood pressures likely multifactorial from dehydration, infection, anemia, paraplegia.  Pt is asymptomatic and MAP>65.    6. Anemia of chronic disease - Transfused 2 units PRBC 11/15 with improvement of hemoglobin.  Appreciate GI consult and assistance.  No gross GI bleeding. CT abd/pelvis negative for retroperitoneal bleed or hematoma.  Hg up to 9 after transfusion.  Hg on 11/25 back down to 7.4 and patient is symptomatic. GI has seen and don't plan for endoscopy at this time given patient's current condition, transfused additional 2 units PRBC 11/25.  Pt has recently tested hemoccult negative during this admission and seen by GI.  No source of bleeding found, resume xarelto.     7. Hypokalemia -replaced.  Magnesium is normal. 8. Stage 2 sacral pressure injury and stage 1 left heel injury - improving, continue wound care protocol. Consulted WOC nurse.  9. Fecal impaction - Treated and Resolved.  Was seen on CT abdomen/pelvis -feels better after receiving enemas.  Continue daily laxatives.  10. Anasarca.  Related to hypoalbuminemia.  Started on Lasix.  Patient has had good urine output.  Dietitian consulted to assist with protein supplementation.   11. Pulmonary edema seen on CXR 11/22.  Treated with IV lasix.  Echocardiogram with normal EF and no diastolic dysfunction. No CHF suspected.     DVT Prophylaxis-  Xarelto Disposition : SN[facility] when insurance authorizes  hopefully in 1-2 days Family Communication:  Discussed with family that patient appears to be dying and I strongly advised them to meet with the palliative care team to help [place] plan on code status.  Pt desires to continue Full Scope treatment.    Code Status:  Full code, palliative care consulted to help address further goals of care.  Family requesting meeting on 11/26 for goals of care.   Consultants:  GI  Palliative care   Antimicrobials:  Ceftriaxone 11/14 >11/16  Azithromycin 11/14>11/16  Vancomycin 11/16 >11/19, 11/22-  Cefepime 11/16 >11/26  Doxycycline 11/27 >   Pressure Injury Documentation: Pressure Injury 02/04/18 Stage II -  Partial thickness loss of dermis presenting as a shallow open ulcer with a red, pink wound bed without slough. (Active)  02/04/18 0130   Location: Sacrum  Location Orientation: Mid  Staging: Stage II -  Partial thickness loss of dermis presenting as a shallow open ulcer with a red, pink wound bed without slough.  Wound Description (Comments):   Present on Admission: Yes     Pressure Injury 02/04/18 Stage I -  Intact skin with non-blanchable redness of a localized area usually over a bony prominence. (Active)  02/04/18 0120   Location: Heel  Location Orientation: Left  Staging: Stage I -  Intact skin with non-blanchable redness of a localized area usually over a bony prominence.  Wound Description (Comments):   Present on Admission: Yes   Echocardiogram 02/12/18 Study Conclusions  - Left ventricle: The cavity size was normal. Wall thickness was   increased in a pattern of mild LVH. Systolic function was normal.   The estimated ejection fraction was in the range of 55% to 60%.   Wall motion was normal; there were no regional wall motion   abnormalities. Left ventricular diastolic function parameters   were normal. - Aortic valve: Valve area (Vmax): 1.78 cm^2. - Atrial septum: No defect or patent foramen ovale was identified.  Subjective: Pt says that she rested well and she has been able to cough up more sputum.      Objective: Vitals:   02/17/18 1622 02/17/18 1915 02/17/18 2300 02/18/18 0504  BP: 123/82  136/80 119/78  Pulse: 70  71 68  Resp: 20  (!) 24 20  Temp:  99.1 F (37.3 C)  99.2 F (37.3 C) 98.6 F (37 C)  TempSrc: Oral  Oral Oral  SpO2: 100% 99% 100% 100%  Weight:      Height:        Intake/Output Summary (Last 24 hours) at 02/18/2018 0820 Last data filed at 02/18/2018 0017 Gross per 24 hour  Intake 982 ml  Output 400 ml  Net 582 ml   Filed Weights   02/10/18 0427 02/11/18 0500 02/17/18 1416  Weight: 111 kg 110.1 kg 103.3 kg   REVIEW OF SYSTEMS  As per history otherwise all reviewed and reported negative  Exam:  General exam: chronically ill appearing female, Alert, awake, oriented x 3, appears pale and weak Respiratory system: rales much improved. No increased WOB. Cardiovascular system:normal s1,s2 sounds. No murmurs, rubs, gallops. Gastrointestinal system: Abdomen is nondistended, soft and nontender. No organomegaly or masses felt. Normal bowel sounds heard. Extremities: trace pretibial edema bilateral LEs unchanged.  Skin: No rashes, lesions or ulcers.  Neuro: paraplegic.   Psychiatry: anxious at times, other times normal affect.    Data Reviewed: Basic Metabolic Panel: Recent Labs  Lab 02/12/18 0427 02/13/18 4944 02/14/18 9675 02/15/18 0620 02/16/18 9163 02/17/18 0445 02/18/18  0450  NA 139 139 139 140 140 139 142  K 4.1 3.7 3.6 3.5 3.2* 3.4* 3.5  CL 106 104 101 99 100 100 102  CO2 27 30 32 34* 34* 33* 34*  GLUCOSE 127* 136* 119* 134* 85 109* 106*  BUN 30* 31* 31* 31* 26* 27* 31*  CREATININE 1.17* 1.19* 1.09* 1.03* 1.11* 1.00 1.14*  CALCIUM 7.8* 7.5* 7.6* 7.4* 7.4* 7.3* 7.4*  MG 2.4 2.4 2.3 2.2  --  2.1  --   PHOS 3.3  --   --   --   --   --   --    Liver Function Tests: Recent Labs  Lab 02/12/18 0427 02/13/18 0711 02/14/18 0644 02/15/18 0620 02/16/18 0608  AST 27 28 28 24 23   ALT 12 12 14 12 14   ALKPHOS 106 97 96 94 106  BILITOT 0.5 0.9 0.8 0.7 0.8  PROT 5.4* 5.3* 5.4* 5.1* 5.1*  ALBUMIN 1.7* 1.7* 1.8* 1.8* 1.8*   No results for input(s): LIPASE, AMYLASE in the last 168 hours. No results  for input(s): AMMONIA in the last 168 hours. CBC: Recent Labs  Lab 02/13/18 0711 02/14/18 0644 02/15/18 0620 02/15/18 2008 02/16/18 0608 02/18/18 0450  WBC 6.7 6.3 6.5  --  7.1 6.4  NEUTROABS 5.7 5.4 5.8  --  5.7 4.6  HGB 9.4* 9.3* 7.4* 11.8* 11.4* 11.0*  HCT 30.9* 30.5* 23.8* 37.2 35.9* 36.1  MCV 90.9 89.7 90.2  --  89.5 90.9  PLT 129* 124* 98*  --  115* 102*   Cardiac Enzymes: No results for input(s): CKTOTAL, CKMB, CKMBINDEX, TROPONINI in the last 168 hours. CBG (last 3)  No results for input(s): GLUCAP in the last 72 hours. No results found for this or any previous visit (from the past 240 hour(s)).   Studies: No results found. Scheduled Meds: . sodium chloride   Intravenous Once  . sodium chloride   Intravenous Once  . acetylcysteine  4 mL Nebulization BID  . Chlorhexidine Gluconate Cloth  6 each Topical Daily  . famotidine  20 mg Oral BID  . feeding supplement (ENSURE ENLIVE)  237 mL Oral TID BM  . feeding supplement (PRO-STAT SUGAR FREE 64)  30 mL Oral TID BM  . [START ON 02/19/2018] furosemide  40 mg Oral QODAY  . guaiFENesin  15 mL Oral BID  . ipratropium-albuterol  3 mL Nebulization TID  . levothyroxine  150 mcg Oral Q0600  . loratadine  10 mg Oral Daily  . mouth rinse  15 mL Mouth Rinse BID  . methylPREDNISolone (SOLU-MEDROL) injection  20 mg Intravenous Daily  . multivitamin with minerals  1 tablet Oral Daily  . pantoprazole  40 mg Oral Daily  . polyethylene glycol  17 g Oral Daily  . [START ON 02/19/2018] potassium chloride  40 mEq Oral QODAY  . rivaroxaban  20 mg Oral Q supper  . senna  1 tablet Oral BID  . sertraline  50 mg Oral Daily   Continuous Infusions: . 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 (HCC)   Constipation   Constipation due to neurogenic bowel   Closed fracture of left distal femur (HCC)   Pressure injury of skin   Anemia   Chronic  anticoagulation   Fecal impaction ([place])   HCAP (healthcare-associated pneumonia)   Community acquired pneumonia   Palliative care by specialist   Goals of  care, counseling/discussion   DNR (do not resuscitate) discussion   Dyspnea   Multifocal pneumonia  Time spent: 21 mins  Irwin Brakeman, MD [facility] Pager [phone]  If 7PM-7AM, please contact night-coverage [email or web address] Password [email or web address] 02/18/2018, 8:20 AM    LOS: 14 days

## 2018-02-18 NOTE — Plan of Care (Signed)
  Problem: Education: Goal: Knowledge of General Education information will improve Description Including pain rating scale, medication(s)/side effects and non-pharmacologic comfort measures Outcome: Progressing   Problem: Health Behavior/Discharge Planning: Goal: Ability to manage health-related needs will improve Outcome: Progressing   Problem: Clinical Measurements: Goal: Ability to maintain clinical measurements within normal limits will improve Outcome: Progressing Goal: Will remain free from infection Outcome: Progressing Goal: Diagnostic test results will improve Outcome: Progressing Goal: Respiratory complications will improve Outcome: Progressing Goal: Cardiovascular complication will be avoided Outcome: Progressing   Problem: Activity: Goal: Risk for activity intolerance will decrease Outcome: Progressing   Problem: Nutrition: Goal: Adequate nutrition will be maintained Outcome: Progressing   Problem: Elimination: Goal: Will not experience complications related to bowel motility Outcome: Progressing Goal: Will not experience complications related to urinary retention Outcome: Progressing   Problem: Pain Managment: Goal: General experience of comfort will improve Outcome: Progressing   Problem: Safety: Goal: Ability to remain free from injury will improve Outcome: Progressing   Problem: Skin Integrity: Goal: Risk for impaired skin integrity will decrease Outcome: Progressing   Problem: Activity: Goal: Ability to tolerate increased activity will improve Outcome: Progressing   Problem: Respiratory: Goal: Ability to maintain adequate ventilation will improve Outcome: Progressing Goal: Ability to maintain a clear airway will improve Outcome: Progressing

## 2018-02-18 NOTE — Progress Notes (Signed)
Subjective: She says she feels about the same.  She is still coughing.  She is coughing up a lot of sputum and feels like her lungs are better  Objective: Vital signs in last 24 hours: Temp:  [98.6 F (37 C)-99.2 F (37.3 C)] 98.6 F (37 C) (11/28 0504) Pulse Rate:  [68-71] 68 (11/28 0504) Resp:  [20-24] 20 (11/28 0504) BP: (119-136)/(78-82) 119/78 (11/28 0504) SpO2:  [95 %-100 %] 97 % (11/28 0934) Weight:  [103.3 kg] 103.3 kg (11/27 1416) Weight change:  Last BM Date: 02/17/18  Intake/Output from previous day: 11/27 0701 - 11/28 0700 In: 982 [P.O.:480; IV Piggyback:502] Out: 400 [Urine:400]  PHYSICAL EXAM General appearance: alert, cooperative and no distress Resp: rhonchi bilaterally Cardio: regular rate and rhythm, S1, S2 normal, no murmur, click, rub or gallop GI: soft, non-tender; bowel sounds normal; no masses,  no organomegaly Extremities: extremities normal, atraumatic, no cyanosis or edema  Lab Results:  Results for orders placed or performed during the hospital encounter of 02/03/18 (from the past 48 hour(s))  Basic metabolic panel     Status: Abnormal   Collection Time: 02/17/18  4:45 AM  Result Value Ref Range   Sodium 139 135 - 145 mmol/L   Potassium 3.4 (L) 3.5 - 5.1 mmol/L   Chloride 100 98 - 111 mmol/L   CO2 33 (H) 22 - 32 mmol/L   Glucose, Bld 109 (H) 70 - 99 mg/dL   BUN 27 (H) 6 - 20 mg/dL   Creatinine, Ser 1.00 0.44 - 1.00 mg/dL   Calcium 7.3 (L) 8.9 - 10.3 mg/dL   GFR calc non Af Amer >60 >60 mL/min   GFR calc Af Amer >60 >60 mL/min   Anion gap 6 5 - 15    Comment: Performed at [facility], [address], [place], [place] [address]  Magnesium     Status: None   Collection Time: 02/17/18  4:45 AM  Result Value Ref Range   Magnesium 2.1 1.7 - 2.4 mg/dL    Comment: Performed at [facility], [address] [place], [place] [address]  Basic metabolic panel     Status: Abnormal   Collection Time: 02/18/18  4:50 AM  Result Value Ref Range   Sodium  142 135 - 145 mmol/L   Potassium 3.5 3.5 - 5.1 mmol/L   Chloride 102 98 - 111 mmol/L   CO2 34 (H) 22 - 32 mmol/L   Glucose, Bld 106 (H) 70 - 99 mg/dL   BUN 31 (H) 6 - 20 mg/dL   Creatinine, Ser 1.14 (H) 0.44 - 1.00 mg/dL   Calcium 7.4 (L) 8.9 - 10.3 mg/dL   GFR calc non Af Amer 56 (L) >60 mL/min   GFR calc Af Amer >60 >60 mL/min   Anion gap 6 5 - 15    Comment: Performed at [facility], [address], [place],  [address]  CBC with Differential/Platelet     Status: Abnormal   Collection Time: 02/18/18  4:50 AM  Result Value Ref Range   WBC 6.4 4.0 - 10.5 K/uL   RBC 3.97 3.87 - 5.11 MIL/uL   Hemoglobin 11.0 (L) 12.0 - 15.0 g/dL   HCT 36.1 36.0 - 46.0 %   MCV 90.9 80.0 - 100.0 fL   MCH 27.7 26.0 - 34.0 pg   MCHC 30.5 30.0 - 36.0 g/dL   RDW 16.1 (H) 11.5 - 15.5 %   Platelets 102 (L) 150 - 400 K/uL    Comment: SPECIMEN  CHECKED FOR CLOTS Immature Platelet Fraction may be clinically indicated, consider ordering this additional test [id number] CONSISTENT WITH PREVIOUS RESULT    nRBC 0.0 0.0 - 0.2 %   Neutrophils Relative % 72 %   Neutro Abs 4.6 1.7 - 7.7 K/uL   Lymphocytes Relative 16 %   Lymphs Abs 1.0 0.7 - 4.0 K/uL   Monocytes Relative 7 %   Monocytes Absolute 0.4 0.1 - 1.0 K/uL   Eosinophils Relative 4 %   Eosinophils Absolute 0.2 0.0 - 0.5 K/uL   Basophils Relative 0 %   Basophils Absolute 0.0 0.0 - 0.1 K/uL   Immature Granulocytes 1 %   Abs Immature Granulocytes 0.07 0.00 - 0.07 K/uL    Comment: Performed at [facility], [address] [place], [place] [address]   *Note: Due to a large number of results and/or encounters for the requested time period, some results have not been displayed. A complete set of results can be found in Results Review.    ABGS No results for input(s): PHART, PO2ART, TCO2, HCO3 in the last 72 hours.  Invalid input(s): PCO2 CULTURES No results found for this or any previous visit (from the past 240 hour(s)). Studies/Results: No  results found.  Medications:  Prior to Admission:  Medications Prior to Admission  Medication Sig Dispense Refill Last Dose  . acetaminophen (TYLENOL) 325 MG tablet Take 2 tablets (650 mg total) by mouth every 4 (four) hours as needed for mild pain ((score 1 to 3) or temp > 100.5).   Past Month at Unknown time  . baclofen (LIORESAL) 10 MG tablet Take 25 mg by mouth 4 (four) times daily as needed for muscle spasms.    02/03/2018 at Unknown time  . famotidine (PEPCID) 20 MG tablet Take 1 tablet (20 mg total) by mouth 2 (two) times daily. 60 tablet 0 Past Week at Unknown time  . fluticasone (FLONASE) 50 MCG/ACT nasal spray Place 2 sprays into both nostrils daily. 16 g 6 02/02/2018 at Unknown time  . levothyroxine (SYNTHROID, LEVOTHROID) 150 MCG tablet Take 150 mcg by mouth daily before breakfast.    02/02/2018 at Unknown time  . loratadine (CLARITIN) 10 MG tablet Take 1 tablet (10 mg total) by mouth daily.   Past Week at Unknown time  . ondansetron (ZOFRAN) 4 MG tablet Take 1 tablet by mouth every 6 (six) hours as needed for nausea or vomiting.    02/02/2018 at Unknown time  . oxyCODONE (ROXICODONE) 5 MG immediate release tablet Take 1 tablet (5 mg total) by mouth every 4 (four) hours as needed. 20 tablet 0 unknown  . oxyCODONE-acetaminophen (PERCOCET/ROXICET) 5-325 MG tablet Take 0.5-1 tablets by mouth every 6 (six) hours as needed for severe pain.   02/03/2018 at Unknown time  . pantoprazole (PROTONIX) 40 MG tablet Take 1 tablet by mouth daily.   Past Week at Unknown time  . polyethylene glycol (MIRALAX / GLYCOLAX) packet Take 17 g by mouth daily. 14 each 0 Past Week at Unknown time  . rivaroxaban (XARELTO) 20 MG TABS tablet Take 20 mg by mouth daily with supper.   02/02/2018 at 1700  . senna (SENOKOT) 8.6 MG TABS tablet Take 1 tablet (8.6 mg total) by mouth 2 (two) times daily. 120 each 0 Past Week at Unknown time  . sertraline (ZOLOFT) 50 MG tablet    02/02/2018 at Unknown time  . sucralfate  (CARAFATE) 1 g tablet Take 1 tablet (1 g total) by mouth 4 (four) times daily -  with meals and at bedtime. Please dissolve tablet in 10-15 mL of water to create a slurry. Patient should drink 10-15 minutes before each meals and at bedtime to reduce painful swallowing. 120 tablet 2 02/02/2018 at Unknown time  . tiZANidine (ZANAFLEX) 2 MG tablet Take 2 mg by mouth every 8 (eight) hours as needed for muscle spasms.    Past Month at Unknown time  . sodium phosphate (FLEET) 7-19 GM/118ML ENEM Place 133 mLs (1 enema total) rectally daily as needed for severe constipation. (Patient not taking: Reported on 01/05/2018)  0 More than a month at Unknown time  . sucralfate (CARAFATE) 1 GM/10ML suspension Take 10 mLs (1 g total) by mouth 4 (four) times daily -  with meals and at bedtime. (Patient not taking: Reported on 02/03/2018) 420 mL 0 Not Taking at Unknown time   Scheduled: . sodium chloride   Intravenous Once  . sodium chloride   Intravenous Once  . acetylcysteine  4 mL Nebulization BID  . Chlorhexidine Gluconate Cloth  6 each Topical Daily  . famotidine  20 mg Oral BID  . feeding supplement (ENSURE ENLIVE)  237 mL Oral TID BM  . feeding supplement (PRO-STAT SUGAR FREE 64)  30 mL Oral TID BM  . [START ON 02/19/2018] furosemide  40 mg Oral QODAY  . guaiFENesin  15 mL Oral BID  . ipratropium-albuterol  3 mL Nebulization TID  . levothyroxine  150 mcg Oral Q0600  . loratadine  10 mg Oral Daily  . mouth rinse  15 mL Mouth Rinse BID  . methylPREDNISolone (SOLU-MEDROL) injection  20 mg Intravenous Daily  . multivitamin with minerals  1 tablet Oral Daily  . pantoprazole  40 mg Oral Daily  . polyethylene glycol  17 g Oral Daily  . [START ON 02/19/2018] potassium chloride  40 mEq Oral QODAY  . rivaroxaban  20 mg Oral Q supper  . senna  1 tablet Oral BID  . sertraline  50 mg Oral Daily   Continuous: . doxycycline (VIBRAMYCIN) IV Stopped (02/18/18 0322)   LYY:[id number], albuterol, ALPRAZolam,  baclofen, dextromethorphan, ondansetron **OR** ondansetron (ZOFRAN) IV, oxyCODONE, promethazine, sodium chloride flush, sodium phosphate, tiZANidine  Assesment: She was admitted with healthcare associated pneumonia and she is much better.  Her situation is complicated by the fact that she has paraplegia from thyroid cancer and has now been noted to have metastatic disease to her brain as well.  Is slowly improving. Active Problems:   Hurthle cell neoplasm of thyroid   Paraplegia at T4 level [facility])   Constipation   Constipation due to neurogenic bowel   Closed fracture of left distal femur (HCC)   Pressure injury of skin   Anemia   Chronic anticoagulation   Fecal impaction (HCC)   HCAP (healthcare-associated pneumonia)   Community acquired pneumonia   Palliative care by specialist   Goals of care, counseling/discussion   DNR (do not resuscitate) discussion   Dyspnea   Multifocal pneumonia    Plan: Continue current treatments.  I will follow more peripherally.    LOS: 14 days   Sarah [name] L 02/18/2018, 12:57 PM

## 2018-02-19 MED ORDER — PREDNISONE 10 MG PO TABS
10.0000 mg | ORAL_TABLET | Freq: Every day | ORAL | Status: DC
  Administered 2018-02-20 – 2018-02-22 (×3): 10 mg via ORAL
  Filled 2018-02-19 (×3): qty 1

## 2018-02-19 NOTE — Clinical Social Work Note (Signed)
Helene Kelp at [facility] indicated that they still had not received authorization for patient and that [facility] was closed today.   Aleynah Rocchio, Clydene Pugh, LCSW

## 2018-02-19 NOTE — Progress Notes (Signed)
PROGRESS NOTE  Sarah [name]  NFA:[id number]  DOB: 1966/01/13  DOA: 02/03/2018 PCP: [name], MD  Brief Admission Hx: 52 y.o. 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  Patient states that symptoms have been going on for past 3 days.  She also complains of nausea but no vomiting.  Denies any fever.  Denies chest pain.  Pt was admitted with a multifocal pneumonia.  Newly discovered brain mets seen on CT brain.   MDM/Assessment & Plan:   1. Healthcare associated pneumonia-Pt is clinically improving.  Chest x-ray shows masslike opacity, likely infectious as it developed quickly since December 13, 2017.  CT confirms a multifocal pneumonia.  Pt was initially started on ceftriaxone and Zithromax but given the severity of infection and recent postop status, coverage was broadened to cefepime/vancomycin.  MRSA (+).   Blood cultures no growth.  Strep pneumo and legionella antigens negative.  Progress has been slow because she has a very weak cough reflex and poor inspiratory effort but it is improving.  Completed mucomyst nebs. Weaning steroids.  completed chest physiotherapy with percussion.  Consulted pulmonary team to assist with management. Continue lasix. Echo with normal EF.  De-escalate antibiotics doxycycline 11/27 to finish on 11/30.    2. History of pulmonary embolism-continue Xarelto for full anticoagulation.  3. 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 Pt has been treated with radiation and chemotherapy.  Pt has been followed by Neurosurgeon Dr. Christella Noa and oncologist [name].  4. Newly discovered Brain Mets - Pt's CT head shows metastases to brain with dural involvement, MRI recommended, patient has not  been medically stable enough to tolerate MRI.  I spoke with and updated her oncologist Dr Irene Limbo 11/25 per patient request.  Pt plans to follow up with him.  Palliative medicine consulted and pt desires to continue full scope treatment.   4. Hypothyroidism-continue Synthroid.  5. Hypotension -Resolved now.  Soft blood pressures likely multifactorial from dehydration, infection, anemia, paraplegia.  Pt is asymptomatic and MAP>65.    6. Anemia of chronic disease - Transfused 2 units PRBC 11/15 with improvement of hemoglobin.  Appreciate GI consult and assistance.  No gross GI bleeding. CT abd/pelvis negative for retroperitoneal bleed or hematoma.  Hg up to 9 after transfusion.  Hg on 11/25 back down to 7.4 and patient is symptomatic. GI has seen and don't plan for endoscopy at this time given patient's current condition, transfused additional 2 units PRBC 11/25.  Pt has recently tested hemoccult negative during this admission and seen by GI.  No source of bleeding found, resume xarelto.     7. Hypokalemia -replaced.  Magnesium is normal. 8. Stage 2 sacral pressure injury and stage 1 left heel injury - improving, continue wound care protocol. Consulted WOC nurse.  9. Fecal impaction - Treated and Resolved.  Was seen on CT abdomen/pelvis -feels better after receiving enemas.  Continue daily laxatives.  10. Anasarca.  Related to hypoalbuminemia.  Started on Lasix.  Patient has had good urine output.  Dietitian consulted to assist with protein supplementation.   11. Pulmonary edema seen on CXR 11/22.  Treated with IV lasix.  Echocardiogram with normal EF and no diastolic dysfunction. No CHF suspected.     DVT Prophylaxis-  Xarelto Disposition : patient  medically ready to discharge to [facility] when insurance authorizes Family Communication:  Discussed with family that patient appears to be dying and I strongly advised them to meet with the palliative care team to help [place] plan on code status.  Pt desires  to continue Full Scope treatment.   Code Status:  Full code, palliative care consulted to help address further goals of care.  Family requesting meeting on 11/26 for goals of care.   Consultants:  GI  Palliative care   Antimicrobials:  Ceftriaxone 11/14 >11/16  Azithromycin 11/14>11/16  Vancomycin 11/16 >11/19, 11/22-  Cefepime 11/16 >11/26  Doxycycline 11/27 >   Pressure Injury Documentation: Pressure Injury 02/04/18 Stage II -  Partial thickness loss of dermis presenting as a shallow open ulcer with a red, pink wound bed without slough. (Active)  02/04/18 0130   Location: Sacrum  Location Orientation: Mid  Staging: Stage II -  Partial thickness loss of dermis presenting as a shallow open ulcer with a red, pink wound bed without slough.  Wound Description (Comments):   Present on Admission: Yes     Pressure Injury 02/04/18 Stage I -  Intact skin with non-blanchable redness of a localized area usually over a bony prominence. (Active)  02/04/18 0120   Location: Heel  Location Orientation: Left  Staging: Stage I -  Intact skin with non-blanchable redness of a localized area usually over a bony prominence.  Wound Description (Comments):   Present on Admission: Yes   Echocardiogram 02/12/18 Study Conclusions  - Left ventricle: The cavity size was normal. Wall thickness was   increased in a pattern of mild LVH. Systolic function was normal.   The estimated ejection fraction was in the range of 55% to 60%.   Wall motion was normal; there were no regional wall motion   abnormalities. Left ventricular diastolic function parameters   were normal. - Aortic valve: Valve area (Vmax): 1.78 cm^2. - Atrial septum: No defect or patent foramen ovale was identified.  Subjective: Pt says that she is coughing up more sputum, overall breathing much better.        Objective: Vitals:   02/18/18 1454 02/18/18 2042 02/18/18 2131 02/19/18 0628  BP: 132/84  99/65 121/84  Pulse: 73   80 71  Resp: 19  18 18   Temp: 99.6 F (37.6 C)  97.6 F (36.4 C) (!) 97.4 F (36.3 C)  TempSrc: Oral  Axillary Oral  SpO2: 100% 98% 99% 100%  Weight:      Height:        Intake/Output Summary (Last 24 hours) at 02/19/2018 1207 Last data filed at 02/19/2018 0900 Gross per 24 hour  Intake 610 ml  Output 900 ml  Net -290 ml   Filed Weights   02/10/18 0427 02/11/18 0500 02/17/18 1416  Weight: 111 kg 110.1 kg 103.3 kg   REVIEW OF SYSTEMS  As per history otherwise all reviewed and reported negative  Exam:  General exam: chronically ill appearing female, Alert, awake, oriented x 3, appears pale and weak Respiratory system: rales much improved, mostly clear. No increased WOB. Cardiovascular system:normal s1,s2 sounds. No murmurs, rubs, gallops. Gastrointestinal system: Abdomen is nondistended, soft and nontender. No organomegaly or masses felt. Normal bowel sounds heard. Extremities: trace pretibial edema bilateral LEs unchanged.  Skin: No rashes, lesions or ulcers.  Neuro: paraplegic.   Psychiatry: anxious at times, other times normal affect.    Data Reviewed: Basic Metabolic Panel: Recent Labs  Lab 02/13/18 0711 02/14/18 7902 02/15/18 4097  02/16/18 0608 02/17/18 0445 02/18/18 0450  NA 139 139 140 140 139 142  K 3.7 3.6 3.5 3.2* 3.4* 3.5  CL 104 101 99 100 100 102  CO2 30 32 34* 34* 33* 34*  GLUCOSE 136* 119* 134* 85 109* 106*  BUN 31* 31* 31* 26* 27* 31*  CREATININE 1.19* 1.09* 1.03* 1.11* 1.00 1.14*  CALCIUM 7.5* 7.6* 7.4* 7.4* 7.3* 7.4*  MG 2.4 2.3 2.2  --  2.1  --    Liver Function Tests: Recent Labs  Lab 02/13/18 0711 02/14/18 0644 02/15/18 0620 02/16/18 0608  AST 28 28 24 23   ALT 12 14 12 14   ALKPHOS 97 96 94 106  BILITOT 0.9 0.8 0.7 0.8  PROT 5.3* 5.4* 5.1* 5.1*  ALBUMIN 1.7* 1.8* 1.8* 1.8*   No results for input(s): LIPASE, AMYLASE in the last 168 hours. No results for input(s): AMMONIA in the last 168 hours. CBC: Recent Labs  Lab  02/13/18 0711 02/14/18 0644 02/15/18 0620 02/15/18 2008 02/16/18 0608 02/18/18 0450  WBC 6.7 6.3 6.5  --  7.1 6.4  NEUTROABS 5.7 5.4 5.8  --  5.7 4.6  HGB 9.4* 9.3* 7.4* 11.8* 11.4* 11.0*  HCT 30.9* 30.5* 23.8* 37.2 35.9* 36.1  MCV 90.9 89.7 90.2  --  89.5 90.9  PLT 129* 124* 98*  --  115* 102*   Cardiac Enzymes: No results for input(s): CKTOTAL, CKMB, CKMBINDEX, TROPONINI in the last 168 hours. CBG (last 3)  No results for input(s): GLUCAP in the last 72 hours. No results found for this or any previous visit (from the past 240 hour(s)).   Studies: No results found. Scheduled Meds: . sodium chloride   Intravenous Once  . sodium chloride   Intravenous Once  . Chlorhexidine Gluconate Cloth  6 each Topical Daily  . famotidine  20 mg Oral BID  . feeding supplement (ENSURE ENLIVE)  237 mL Oral TID BM  . feeding supplement (PRO-STAT SUGAR FREE 64)  30 mL Oral TID BM  . furosemide  40 mg Oral QODAY  . guaiFENesin  15 mL Oral BID  . ipratropium-albuterol  3 mL Nebulization TID  . levothyroxine  150 mcg Oral Q0600  . loratadine  10 mg Oral Daily  . mouth rinse  15 mL Mouth Rinse BID  . methylPREDNISolone (SOLU-MEDROL) injection  20 mg Intravenous Daily  . multivitamin with minerals  1 tablet Oral Daily  . pantoprazole  40 mg Oral Daily  . polyethylene glycol  17 g Oral Daily  . potassium chloride  40 mEq Oral QODAY  . rivaroxaban  20 mg Oral Q supper  . senna  1 tablet Oral BID  . sertraline  50 mg Oral Daily   Continuous Infusions: . doxycycline (VIBRAMYCIN) IV 100 mg (02/19/18 0114)    Active Problems:   Hurthle cell neoplasm of thyroid   Paraplegia at T4 level (HCC)   Constipation   Constipation due to neurogenic bowel   Closed fracture of left distal femur (HCC)   Pressure injury of skin   Anemia   Chronic anticoagulation   Fecal impaction ([place])   HCAP (healthcare-associated pneumonia)   Community acquired pneumonia   Palliative care by specialist   Goals of  care, counseling/discussion   DNR (do not resuscitate) discussion   Dyspnea   Multifocal pneumonia  Time spent: 21 mins  Irwin Brakeman, MD [facility] Pager [phone]  If 7PM-7AM, please contact night-coverage [email or web address] Password [email or web address] 02/19/2018, 12:07 PM    LOS:  15 days

## 2018-02-19 NOTE — Care Management Important Message (Signed)
Important Message  Patient Details  Name: Sarah [name] MRN: [id number] Date of Birth: Jun 24, 1965   Medicare Important Message Given:  Yes    Beronica Lansdale, Chauncey Reading, RN 02/19/2018, 9:02 AM

## 2018-02-20 NOTE — Progress Notes (Signed)
Patient nauseated at treatment declined flutter

## 2018-02-20 NOTE — Discharge Summary (Signed)
PROGRESS NOTE  Sarah [name]  PIR:[id number]  DOB: 12-10-1965  DOA: 02/03/2018 PCP: [name], MD  Brief Admission Hx: 52 y.o. 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  Patient states that symptoms have been going on for past 3 days.  She also complains of nausea but no vomiting.  Denies any fever.  Denies chest pain.  Pt was admitted with a multifocal pneumonia.  Newly discovered brain mets seen on CT brain.   MDM/Assessment & Plan:   1. Healthcare associated pneumonia-Pt is clinically improving.  Chest x-ray shows masslike opacity, likely infectious as it developed quickly since December 13, 2017.  CT confirms a multifocal pneumonia.  Pt was initially started on ceftriaxone and Zithromax but given the severity of infection and recent postop status, coverage was broadened to cefepime/vancomycin.  MRSA (+).   Blood cultures no growth.  Strep pneumo and legionella antigens negative.  Progress has been slow because she has a very weak cough reflex and poor inspiratory effort but it is improving.  Completed mucomyst nebs. Weaning steroids.  completed chest physiotherapy with percussion.  Consulted pulmonary team to assist with management. Continue lasix. Echo with normal EF.  De-escalate antibiotics doxycycline 11/27 to 11/30.    2. History of pulmonary embolism-continue Xarelto for full anticoagulation.  3. 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 Pt has been treated with radiation and chemotherapy.  Pt has been followed by Neurosurgeon Dr. Christella Noa and oncologist [name].  4. Newly discovered Brain Mets - Pt's CT head shows metastases to brain with dural involvement, MRI recommended, patient has not been  medically stable enough to tolerate MRI.  This can also be pursued as an outpatient.  I spoke with and updated her oncologist Dr Irene Limbo 11/25 per patient request.  Pt plans to follow up with him.  Palliative medicine consulted and pt desires to continue full scope treatment.   4. Hypothyroidism-continue Synthroid.  5. Hypotension -Resolved now.  Soft blood pressures likely multifactorial from dehydration, infection, anemia, paraplegia.  Pt is asymptomatic and MAP>65.    6. Anemia of chronic disease - Transfused 2 units PRBC 11/15 with improvement of hemoglobin.  Appreciate GI consult and assistance.  No gross GI bleeding. CT abd/pelvis negative for retroperitoneal bleed or hematoma.  Hg up to 9 after transfusion.  Hg on 11/25 back down to 7.4 and patient is symptomatic. GI has seen and don't plan for endoscopy at this time given patient's current condition, transfused additional 2 units PRBC 11/25.  Pt has recently tested hemoccult negative during this admission and seen by GI.  No source of bleeding found, resume xarelto.     7. Hypokalemia -replaced.  Magnesium is normal. 8. Stage 2 sacral pressure injury and stage 1 left heel injury - improving, continue wound care protocol. Consulted WOC nurse.  9. Fecal impaction - Treated and Resolved.  Was seen on CT abdomen/pelvis -feels better after receiving enemas.  Continue daily laxatives.  10. Anasarca.  Related to hypoalbuminemia.  Started on Lasix.  Patient has had good urine output.  Dietitian consulted to assist with protein supplementation.  Improved with Lasix 11. Pulmonary edema seen on CXR 11/22.  Treated with IV lasix.  Echocardiogram with normal EF and no diastolic dysfunction. No CHF suspected.  DVT Prophylaxis-  Xarelto  Disposition : patient medically ready to discharge to [facility] when insurance authorizes  Family Communication:  No family present today  Code Status:  Full code   Consultants:  GI  Palliative care    Antimicrobials:  Ceftriaxone 11/14 >11/16  Azithromycin 11/14>11/16  Vancomycin 11/16 >11/19, 11/22-  Cefepime 11/16 >11/26  Doxycycline 11/27 > 11/30   Pressure Injury Documentation: Pressure Injury 02/04/18 Stage II -  Partial thickness loss of dermis presenting as a shallow open ulcer with a red, pink wound bed without slough. (Active)  02/04/18 0130   Location: Sacrum  Location Orientation: Mid  Staging: Stage II -  Partial thickness loss of dermis presenting as a shallow open ulcer with a red, pink wound bed without slough.  Wound Description (Comments):   Present on Admission: Yes     Pressure Injury 02/04/18 Stage I -  Intact skin with non-blanchable redness of a localized area usually over a bony prominence. (Active)  02/04/18 0120   Location: Heel  Location Orientation: Left  Staging: Stage I -  Intact skin with non-blanchable redness of a localized area usually over a bony prominence.  Wound Description (Comments):   Present on Admission: Yes   Echocardiogram 02/12/18 Study Conclusions  - Left ventricle: The cavity size was normal. Wall thickness was   increased in a pattern of mild LVH. Systolic function was normal.   The estimated ejection fraction was in the range of 55% to 60%.   Wall motion was normal; there were no regional wall motion   abnormalities. Left ventricular diastolic function parameters   were normal. - Aortic valve: Valve area (Vmax): 1.78 cm^2. - Atrial septum: No defect or patent foramen ovale was identified.  Subjective: [name] feeling better.  Overall shortness of breath is improving.  Cough has improved.        Objective: Vitals:   02/20/18 0535 02/20/18 0851 02/20/18 1409 02/20/18 1600  BP: 105/81  112/73   Pulse: 63  80   Resp: 18  20   Temp: 98.2 F (36.8 C)  98.7 F (37.1 C)   TempSrc: Oral  Oral   SpO2: 100% 99% 100% 100%  Weight:      Height:        Intake/Output Summary (Last 24 hours) at 02/20/2018  1715 Last data filed at 02/20/2018 1348 Gross per 24 hour  Intake 480 ml  Output 1950 ml  Net -1470 ml   Filed Weights   02/10/18 0427 02/11/18 0500 02/17/18 1416  Weight: 111 kg 110.1 kg 103.3 kg   REVIEW OF SYSTEMS  As per history otherwise all reviewed and reported negative  Exam:  General exam: Alert, awake, oriented x 3 Respiratory system: Clear to auscultation. Respiratory effort normal. Cardiovascular system:RRR. No murmurs, rubs, gallops. Gastrointestinal system: Abdomen is nondistended, soft and nontender. No organomegaly or masses felt. Normal bowel sounds heard. Extremities: 1+ pedal edema bilaterally Skin: No rashes, lesions or ulcers Psychiatry: Judgement and insight appear normal. Mood & affect appropriate.    Data Reviewed: Basic Metabolic Panel: Recent Labs  Lab 02/14/18 0644 02/15/18 0620 02/16/18 0608 02/17/18 0445 02/18/18 0450  NA 139 140 140 139 142  K 3.6 3.5 3.2* 3.4* 3.5  CL 101 99 100 100 102  CO2 32 34* 34* 33* 34*  GLUCOSE 119* 134* 85 109* 106*  BUN 31* 31* 26* 27* 31*  CREATININE 1.09* 1.03* 1.11* 1.00 1.14*  CALCIUM 7.6* 7.4* 7.4* 7.3* 7.4*  MG 2.3  2.2  --  2.1  --    Liver Function Tests: Recent Labs  Lab 02/14/18 0644 02/15/18 0620 02/16/18 0608  AST 28 24 23   ALT 14 12 14   ALKPHOS 96 94 106  BILITOT 0.8 0.7 0.8  PROT 5.4* 5.1* 5.1*  ALBUMIN 1.8* 1.8* 1.8*   No results for input(s): LIPASE, AMYLASE in the last 168 hours. No results for input(s): AMMONIA in the last 168 hours. CBC: Recent Labs  Lab 02/14/18 0644 02/15/18 0620 02/15/18 2008 02/16/18 0608 02/18/18 0450  WBC 6.3 6.5  --  7.1 6.4  NEUTROABS 5.4 5.8  --  5.7 4.6  HGB 9.3* 7.4* 11.8* 11.4* 11.0*  HCT 30.5* 23.8* 37.2 35.9* 36.1  MCV 89.7 90.2  --  89.5 90.9  PLT 124* 98*  --  115* 102*   Cardiac Enzymes: No results for input(s): CKTOTAL, CKMB, CKMBINDEX, TROPONINI in the last 168 hours. CBG (last 3)  No results for input(s): GLUCAP in the last 72  hours. No results found for this or any previous visit (from the past 240 hour(s)).   Studies: No results found. Scheduled Meds: . sodium chloride   Intravenous Once  . sodium chloride   Intravenous Once  . Chlorhexidine Gluconate Cloth  6 each Topical Daily  . famotidine  20 mg Oral BID  . feeding supplement (ENSURE ENLIVE)  237 mL Oral TID BM  . feeding supplement (PRO-STAT SUGAR FREE 64)  30 mL Oral TID BM  . furosemide  40 mg Oral QODAY  . guaiFENesin  15 mL Oral BID  . ipratropium-albuterol  3 mL Nebulization TID  . levothyroxine  150 mcg Oral Q0600  . loratadine  10 mg Oral Daily  . mouth rinse  15 mL Mouth Rinse BID  . multivitamin with minerals  1 tablet Oral Daily  . pantoprazole  40 mg Oral Daily  . polyethylene glycol  17 g Oral Daily  . potassium chloride  40 mEq Oral QODAY  . predniSONE  10 mg Oral Q breakfast  . rivaroxaban  20 mg Oral Q supper  . senna  1 tablet Oral BID  . sertraline  50 mg Oral Daily   Continuous Infusions:   Active Problems:   Hurthle cell neoplasm of thyroid   Paraplegia at T4 level [facility])   Constipation   Constipation due to neurogenic bowel   Closed fracture of left distal femur (HCC)   Pressure injury of skin   Anemia   Chronic anticoagulation   Fecal impaction (HCC)   HCAP (healthcare-associated pneumonia)   Community acquired pneumonia   Palliative care by specialist   Goals of care, counseling/discussion   DNR (do not resuscitate) discussion   Dyspnea   Multifocal pneumonia  Time spent: 51mins  Kathie Dike, MD [facility] Pager [phone]  If 7PM-7AM, please contact night-coverage [email or web address] Password [email or web address] 02/20/2018, 5:15 PM    LOS: 16 days

## 2018-02-21 LAB — BASIC METABOLIC PANEL
ANION GAP: 5 (ref 5–15)
BUN: 24 mg/dL — ABNORMAL HIGH (ref 6–20)
CALCIUM: 7.6 mg/dL — AB (ref 8.9–10.3)
CO2: 32 mmol/L (ref 22–32)
Chloride: 104 mmol/L (ref 98–111)
Creatinine, Ser: 0.95 mg/dL (ref 0.44–1.00)
GFR calc Af Amer: >60 mL/min (ref >60)
GFR calc non Af Amer: >60 mL/min (ref >60)
Glucose, Bld: 96 mg/dL (ref 70–99)
Potassium: 3.5 mmol/L (ref 3.5–5.1)
Sodium: 141 mmol/L (ref 135–145)

## 2018-02-21 LAB — CBC
HEMATOCRIT: 32.2 % — AB (ref 36.0–46.0)
Hemoglobin: 9.9 g/dL — ABNORMAL LOW (ref 12.0–15.0)
MCH: 28.5 pg (ref 26.0–34.0)
MCHC: 30.7 g/dL (ref 30.0–36.0)
MCV: 92.8 fL (ref 80.0–100.0)
NRBC: 0 % (ref 0.0–0.2)
Platelets: 127 10*3/uL — ABNORMAL LOW (ref 150–400)
RBC: 3.47 MIL/uL — ABNORMAL LOW (ref 3.87–5.11)
RDW: 16.1 % — AB (ref 11.5–15.5)
WBC: 3.8 10*3/uL — ABNORMAL LOW (ref 4.0–10.5)

## 2018-02-21 LAB — TSH: TSH: 20.897 u[IU]/mL — ABNORMAL HIGH (ref 0.350–4.500)

## 2018-02-21 NOTE — Progress Notes (Signed)
PROGRESS NOTE  Sarah [name]  VOZ:[id number]  DOB: Jun 24, 1965  DOA: 02/03/2018 PCP: [name], MD  Brief Admission Hx: 52 y.o. 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  Patient states that symptoms have been going on for past 3 days.  She also complains of nausea but no vomiting.  Denies any fever.  Denies chest pain.  Pt was admitted with a multifocal pneumonia.  Newly discovered brain mets seen on CT brain.   MDM/Assessment & Plan:   1. Healthcare associated pneumonia-Pt is clinically improving.  Chest x-ray shows masslike opacity, likely infectious as it developed quickly since December 13, 2017.  CT confirms a multifocal pneumonia.  Pt was initially started on ceftriaxone and Zithromax but given the severity of infection and recent postop status, coverage was broadened to cefepime/vancomycin.  MRSA (+).   Blood cultures no growth.  Strep pneumo and legionella antigens negative.  Progress has been slow because she has a very weak cough reflex and poor inspiratory effort but it is improving.  Completed mucomyst nebs. Weaning steroids.  completed chest physiotherapy with percussion.  Consulted pulmonary team to assist with management. Continue lasix. Echo with normal EF.  De-escalated antibiotics doxycycline 11/27 to 11/30.    2. History of pulmonary embolism-continued on Xarelto for full anticoagulation.  3. 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 Pt has been treated with radiation and chemotherapy.  Pt has been followed by Neurosurgeon Dr. Christella Noa and oncologist [name].  4. Newly discovered Brain Mets - Pt's CT head shows metastases to brain with dural involvement, MRI recommended, patient has not been  medically stable enough to tolerate MRI.  This can also be pursued as an outpatient. Dr. Wynetta Emery spoke with and updated her oncologist Dr Irene Limbo 11/25 per patient request.  Pt plans to follow up with him.  Palliative medicine met with patient to discuss goals of care and pt desires to continue full scope treatment.   4. Hypothyroidism-continue Synthroid.  5. Hypotension -Resolved now.  Soft blood pressures likely multifactorial from dehydration, infection, anemia, paraplegia.  Pt is asymptomatic and MAP>65.    6. Anemia of chronic disease - Transfused 2 units PRBC 11/15 with improvement of hemoglobin.  Appreciate GI consult and assistance.  No gross GI bleeding. CT abd/pelvis negative for retroperitoneal bleed or hematoma.  Hg up to 9 after transfusion.  Hg on 11/25 back down to 7.4 and patient is symptomatic. GI has seen and don't plan for endoscopy at this time given patient's current condition, transfused additional 2 units PRBC 11/25.  Pt has recently tested hemoccult negative during this admission and seen by GI.  No source of bleeding found, resume xarelto.     7. Hypokalemia -replaced.  Magnesium is normal. 8. Stage 2 sacral pressure injury and stage 1 left heel injury - improving, continue wound care protocol. Consulted WOC nurse.  9. Fecal impaction - Treated and Resolved.  Was seen on CT abdomen/pelvis -feels better after receiving enemas.  Continue daily laxatives.  10. Anasarca.  Related to hypoalbuminemia.  Started on Lasix.  Patient has had good urine output.  Dietitian consulted to assist with protein supplementation.  Improved with Lasix 11. Pulmonary edema seen on CXR 11/22.  Treated with IV lasix.  Echocardiogram with normal EF and  no diastolic dysfunction. No CHF suspected.     DVT Prophylaxis-  Xarelto  Disposition : patient medically ready to discharge to [facility] when insurance authorizes  Family Communication:  No family present today  Code Status:  Full code    Consultants:  GI  Palliative care   Antimicrobials:  Ceftriaxone 11/14 >11/16  Azithromycin 11/14>11/16  Vancomycin 11/16 >11/19, 11/22-  Cefepime 11/16 >11/26  Doxycycline 11/27 > 11/30   Pressure Injury Documentation: Pressure Injury 02/04/18 Stage II -  Partial thickness loss of dermis presenting as a shallow open ulcer with a red, pink wound bed without slough. (Active)  02/04/18 0130   Location: Sacrum  Location Orientation: Mid  Staging: Stage II -  Partial thickness loss of dermis presenting as a shallow open ulcer with a red, pink wound bed without slough.  Wound Description (Comments):   Present on Admission: Yes     Pressure Injury 02/04/18 Stage I -  Intact skin with non-blanchable redness of a localized area usually over a bony prominence. (Active)  02/04/18 0120   Location: Heel  Location Orientation: Left  Staging: Stage I -  Intact skin with non-blanchable redness of a localized area usually over a bony prominence.  Wound Description (Comments):   Present on Admission: Yes   Echocardiogram 02/12/18 Study Conclusions  - Left ventricle: The cavity size was normal. Wall thickness was   increased in a pattern of mild LVH. Systolic function was normal.   The estimated ejection fraction was in the range of 55% to 60%.   Wall motion was normal; there were no regional wall motion   abnormalities. Left ventricular diastolic function parameters   were normal. - Aortic valve: Valve area (Vmax): 1.78 cm^2. - Atrial septum: No defect or patent foramen ovale was identified.  Subjective: Continues to feel better.  Shortness of breath is better.  Continues to have productive cough.  Objective: Vitals:   02/20/18 2239 02/21/18 0621 02/21/18 0838 02/21/18 1338  BP: (!) 141/87 104/71  116/79  Pulse: 71 64  86  Resp: _0 Temp: 98.3 F (36.8 C)   98.1 F (36.7 C)  TempSrc: Oral   Oral  SpO2: 100% 100% 100% 99%  Weight:      Height:         Intake/Output Summary (Last 24 hours) at 02/21/2018 1732 Last data filed at 02/21/2018 1500 Gross per 24 hour  Intake 240 ml  Output 1800 ml  Net -1560 ml   Filed Weights   02/10/18 0427 02/11/18 0500 02/17/18 1416  Weight: 111 kg 110.1 kg 103.3 kg   REVIEW OF SYSTEMS  As per history otherwise all reviewed and reported negative  Exam:  General exam: Alert, awake, oriented x 3 Respiratory system: Clear to auscultation. Respiratory effort normal. Cardiovascular system:RRR. No murmurs, rubs, gallops. Gastrointestinal system: Abdomen is nondistended, soft and nontender. No organomegaly or masses felt. Normal bowel sounds heard.  Data Reviewed: Basic Metabolic Panel: Recent Labs  Lab 02/15/18 0620 02/16/18 0608 02/17/18 0445 02/18/18 0450 02/21/18 0722  NA 140 140 139 142 141  K 3.5 3.2* 3.4* 3.5 3.5  CL 99 100 100 102 104  CO2 34* 34* 33* 34* 32  GLUCOSE 134* 85 109* 106* 96  BUN 31* 26* 27* 31* 24*  CREATININE 1.03* 1.11* 1.00 1.14* 0.95  CALCIUM 7.4* 7.4* 7.3* 7.4* 7.6*  MG 2.2  --  2.1  --   --    Liver Function Tests: Recent Labs  Lab 02/15/18 0620 02/16/18 0608  AST 24 23  ALT 12 14  ALKPHOS 94 106  BILITOT 0.7 0.8  PROT 5.1* 5.1*  ALBUMIN 1.8* 1.8*   No results for input(s): LIPASE, AMYLASE in the last 168 hours. No results for input(s): AMMONIA in the last 168 hours. CBC: Recent Labs  Lab 02/15/18 0620 02/15/18 2008 02/16/18 0608 02/18/18 0450 02/21/18 0722  WBC 6.5  --  7.1 6.4 3.8*  NEUTROABS 5.8  --  5.7 4.6  --   HGB 7.4* 11.8* 11.4* 11.0* 9.9*  HCT 23.8* 37.2 35.9* 36.1 32.2*  MCV 90.2  --  89.5 90.9 92.8  PLT 98*  --  115* 102* 127*   Cardiac Enzymes: No results for input(s): CKTOTAL, CKMB, CKMBINDEX, TROPONINI in the last 168 hours. CBG (last 3)  No results for input(s): GLUCAP in the last 72 hours. No results found for this or any previous visit (from the past 240 hour(s)).   Studies: No results found. Scheduled Meds: .  sodium chloride   Intravenous Once  . sodium chloride   Intravenous Once  . Chlorhexidine Gluconate Cloth  6 each Topical Daily  . famotidine  20 mg Oral BID  . feeding supplement (ENSURE ENLIVE)  237 mL Oral TID BM  . feeding supplement (PRO-STAT SUGAR FREE 64)  30 mL Oral TID BM  . furosemide  40 mg Oral QODAY  . guaiFENesin  15 mL Oral BID  . ipratropium-albuterol  3 mL Nebulization TID  . levothyroxine  150 mcg Oral Q0600  . loratadine  10 mg Oral Daily  . mouth rinse  15 mL Mouth Rinse BID  . multivitamin with minerals  1 tablet Oral Daily  . pantoprazole  40 mg Oral Daily  . polyethylene glycol  17 g Oral Daily  . potassium chloride  40 mEq Oral QODAY  . predniSONE  10 mg Oral Q breakfast  . rivaroxaban  20 mg Oral Q supper  . senna  1 tablet Oral BID  . sertraline  50 mg Oral Daily   Continuous Infusions:   Active Problems:   Hurthle cell neoplasm of thyroid   Paraplegia at T4 level [facility])   Constipation   Constipation due to neurogenic bowel   Closed fracture of left distal femur (HCC)   Pressure injury of skin   Anemia   Chronic anticoagulation   Fecal impaction (HCC)   HCAP (healthcare-associated pneumonia)   Community acquired pneumonia   Palliative care by specialist   Goals of care, counseling/discussion   DNR (do not resuscitate) discussion   Dyspnea   Multifocal pneumonia  Time spent: 27mns  JKathie Dike MD [facility] Pager [phone] If 7PM-7AM, please contact night-coverage [email or web address] Password [email or web address] 02/21/2018, 5:32 PM    LOS: 17 days

## 2018-02-22 LAB — CREATININE, SERUM
Creatinine, Ser: 1.02 mg/dL — ABNORMAL HIGH (ref 0.44–1.00)
GFR calc non Af Amer: >60 mL/min (ref >60)

## 2018-02-22 MED ORDER — PREDNISONE 10 MG PO TABS
5.0000 mg | ORAL_TABLET | Freq: Every day | ORAL | Status: DC
  Administered 2018-02-23: 5 mg via ORAL
  Filled 2018-02-22: qty 1

## 2018-02-22 NOTE — Progress Notes (Signed)
PROGRESS NOTE  Sarah [name]  [name]  DOB: [birth year]  DOA: 02/03/2018 PCP: [name], MD  Brief Admission Hx: 52 y.o. 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  Patient states that symptoms have been going on for past 3 days.  She also complains of nausea but no vomiting.  Denies any fever.  Denies chest pain.  Pt was admitted with a multifocal pneumonia.  Newly discovered brain mets seen on CT brain.   MDM/Assessment & Plan:   1. Healthcare associated pneumonia-Pt is clinically improving.  Chest x-ray shows masslike opacity, likely infectious as it developed quickly since December 13, 2017.  CT confirmed a multifocal pneumonia.  Pt was initially started on ceftriaxone and Zithromax but given the severity of infection and recent postop status, coverage was broadened to cefepime/vancomycin.  MRSA (+).   Blood cultures no growth.  Strep pneumo and legionella antigens negative.  Progress has been slow because she has a very weak cough reflex and poor inspiratory effort but it is improving.  Completed mucomyst nebs. Weaning steroids.  completed chest physiotherapy with percussion.  Consulted pulmonary team to assist with management. Continue lasix. Echo with normal EF.  De-escalated antibiotics doxycycline 11/27 to 11/30.    2. History of pulmonary embolism-continued on Xarelto for full anticoagulation.  3. 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 Pt has been treated with radiation and chemotherapy.  Pt has been followed by Neurosurgeon Dr. Christella Noa and oncologist [name].  4. Newly discovered Brain Mets - Pt's CT head shows metastases to brain with dural involvement, MRI recommended, patient has not been  medically stable enough to tolerate MRI.  This can be pursued as an outpatient. Dr. Wynetta Emery spoke with and updated her oncologist Dr Irene Limbo 11/25 per patient request.  Pt plans to follow up with him.  Palliative medicine met with patient to discuss goals of care and pt desires to continue full scope treatment.   4. Hypothyroidism-continue Synthroid.  5. Hypotension -Resolved now.  Soft blood pressures likely multifactorial from dehydration, infection, anemia, paraplegia.  Pt is asymptomatic and MAP>65.    6. Anemia of chronic disease - Transfused 2 units PRBC 11/15 with improvement of hemoglobin.  Appreciate GI consult and assistance.  No gross GI bleeding. CT abd/pelvis negative for retroperitoneal bleed or hematoma.  Hg up to 9 after transfusion.  Hg on 11/25 back down to 7.4 and patient is symptomatic. GI has seen and don't plan for endoscopy at this time given patient's current condition, transfused additional 2 units PRBC 11/25.  Pt has recently tested hemoccult negative during this admission and seen by GI.  No source of bleeding found, resume xarelto.     7. Hypokalemia -replaced.  Magnesium is normal. 8. Stage 2 sacral pressure injury and stage 1 left heel injury - improving, continue wound care protocol. Consulted WOC nurse.  9. Fecal impaction - Treated and Resolved.  Was seen on CT abdomen/pelvis -feels better after receiving enemas.  Continue daily laxatives.  10. Anasarca.  Improved. Related to hypoalbuminemia.  Started on Lasix.  Patient has had good urine output.  Dietitian consulted to assist with protein supplementation.  Improved with Lasix 11. Pulmonary edema seen on CXR 11/22.  Treated with IV lasix.  Echocardiogram with normal EF and  no diastolic dysfunction. No CHF suspected.     DVT Prophylaxis-  Xarelto  Disposition : patient medically ready to discharge to [facility] when insurance authorizes  Family Communication:  family by telephone  Code Status:  Full code    Consultants:  GI  Palliative care   Antimicrobials:  Ceftriaxone 11/14 >11/16  Azithromycin 11/14>11/16  Vancomycin 11/16 >11/19, 11/22-  Cefepime 11/16 >11/26  Doxycycline 11/27 > 11/30   Pressure Injury Documentation: Pressure Injury 02/04/18 Stage II -  Partial thickness loss of dermis presenting as a shallow open ulcer with a red, pink wound bed without slough. (Active)  02/04/18 0130   Location: Sacrum  Location Orientation: Mid  Staging: Stage II -  Partial thickness loss of dermis presenting as a shallow open ulcer with a red, pink wound bed without slough.  Wound Description (Comments):   Present on Admission: Yes     Pressure Injury 02/04/18 Stage I -  Intact skin with non-blanchable redness of a localized area usually over a bony prominence. (Active)  02/04/18 0120   Location: Heel  Location Orientation: Left  Staging: Stage I -  Intact skin with non-blanchable redness of a localized area usually over a bony prominence.  Wound Description (Comments):   Present on Admission: Yes   Echocardiogram 02/12/18 Study Conclusions  - Left ventricle: The cavity size was normal. Wall thickness was   increased in a pattern of mild LVH. Systolic function was normal.   The estimated ejection fraction was in the range of 55% to 60%.   Wall motion was normal; there were no regional wall motion   abnormalities. Left ventricular diastolic function parameters   were normal. - Aortic valve: Valve area (Vmax): 1.78 cm^2. - Atrial septum: No defect or patent foramen ovale was identified.  Subjective: Pt upset about not being able to discharge but understands that we are waiting on insurance.  Continues to feel better.  Shortness of breath is better.  Continues to have productive cough.  Objective: Vitals:   02/21/18 2247 02/22/18 0616 02/22/18 0742 02/22/18 1411  BP: 110/68 139/83  109/75  Pulse: 91 76  87  Resp: 19 17  18   Temp: 98.7 F (37.1 C) 98.5 F (36.9 C)   98.9 F (37.2 C)  TempSrc: Oral Oral  Oral  SpO2: 99% 100% 99% 98%  Weight:      Height:        Intake/Output Summary (Last 24 hours) at 02/22/2018 1608 Last data filed at 02/22/2018 1456 Gross per 24 hour  Intake 960 ml  Output 800 ml  Net 160 ml   Filed Weights   02/10/18 0427 02/11/18 0500 02/17/18 1416  Weight: 111 kg 110.1 kg 103.3 kg   REVIEW OF SYSTEMS  As per history otherwise all reviewed and reported negative  Exam:  General exam: Alert, awake, oriented x 3 Respiratory system: Clear to auscultation. Respiratory effort normal. Cardiovascular system:normal s1,s2 sounds. No murmurs, rubs, gallops. Gastrointestinal system: Abdomen is nondistended, soft and nontender. No organomegaly or masses felt. Normal bowel sounds heard. Neuro: Paraplegic.  Ext: no cyanosis or clubbing, trace pretibial edema bilateral LEs.   Data Reviewed: Basic Metabolic Panel: Recent Labs  Lab 02/16/18 0608 02/17/18 0445 02/18/18 0450 02/21/18 0722 02/22/18 0810  NA 140 139 142 141  --   K 3.2* 3.4* 3.5 3.5  --   CL 100 100 102 104  --   CO2 34* 33* 34* 32  --   GLUCOSE 85 109* 106* 96  --  BUN 26* 27* 31* 24*  --   CREATININE 1.11* 1.00 1.14* 0.95 1.02*  CALCIUM 7.4* 7.3* 7.4* 7.6*  --   MG  --  2.1  --   --   --    Liver Function Tests: Recent Labs  Lab 02/16/18 0608  AST 23  ALT 14  ALKPHOS 106  BILITOT 0.8  PROT 5.1*  ALBUMIN 1.8*   No results for input(s): LIPASE, AMYLASE in the last 168 hours. No results for input(s): AMMONIA in the last 168 hours. CBC: Recent Labs  Lab 02/15/18 2008 02/16/18 0608 02/18/18 0450 02/21/18 0722  WBC  --  7.1 6.4 3.8*  NEUTROABS  --  5.7 4.6  --   HGB 11.8* 11.4* 11.0* 9.9*  HCT 37.2 35.9* 36.1 32.2*  MCV  --  89.5 90.9 92.8  PLT  --  115* 102* 127*   Cardiac Enzymes: No results for input(s): CKTOTAL, CKMB, CKMBINDEX, TROPONINI in the last 168 hours. CBG (last 3)  No results for input(s): GLUCAP in the last 72 hours. No  results found for this or any previous visit (from the past 240 hour(s)).   Studies: No results found. Scheduled Meds: . sodium chloride   Intravenous Once  . sodium chloride   Intravenous Once  . Chlorhexidine Gluconate Cloth  6 each Topical Daily  . famotidine  20 mg Oral BID  . feeding supplement (ENSURE ENLIVE)  237 mL Oral TID BM  . feeding supplement (PRO-STAT SUGAR FREE 64)  30 mL Oral TID BM  . furosemide  40 mg Oral QODAY  . guaiFENesin  15 mL Oral BID  . ipratropium-albuterol  3 mL Nebulization TID  . levothyroxine  150 mcg Oral Q0600  . loratadine  10 mg Oral Daily  . mouth rinse  15 mL Mouth Rinse BID  . multivitamin with minerals  1 tablet Oral Daily  . pantoprazole  40 mg Oral Daily  . polyethylene glycol  17 g Oral Daily  . potassium chloride  40 mEq Oral QODAY  . predniSONE  10 mg Oral Q breakfast  . rivaroxaban  20 mg Oral Q supper  . senna  1 tablet Oral BID  . sertraline  50 mg Oral Daily   Continuous Infusions:   Active Problems:   Hurthle cell neoplasm of thyroid   Paraplegia at T4 level [facility])   Constipation   Constipation due to neurogenic bowel   Closed fracture of left distal femur (HCC)   Pressure injury of skin   Anemia   Chronic anticoagulation   Fecal impaction (HCC)   HCAP (healthcare-associated pneumonia)   Community acquired pneumonia   Palliative care by specialist   Goals of care, counseling/discussion   DNR (do not resuscitate) discussion   Dyspnea   Multifocal pneumonia  Time spent: 25 mins  Irwin Brakeman, MD [facility] Pager [phone]  If 7PM-7AM, please contact night-coverage [email or web address] Password [email or web address] 02/22/2018, 4:08 PM    LOS: 18 days

## 2018-02-22 NOTE — Clinical Social Work Note (Signed)
LCSW spoke with Jackelyn Poling at [facility] who indicated that she started [facility] authorization on 02/14/18 with Portia J. [employer] representative. Jackelyn Poling was advised by Truddie Crumble that the system was down and was given a reference number. She stated that she learned today that the authorization were not initiated by [employer]. On 11/24. She stated that she initiated authorizations on today and will hopefully have authorization tomorrow.

## 2018-02-23 DIAGNOSIS — C73 Malignant neoplasm of thyroid gland: Secondary | ICD-10-CM | POA: Diagnosis not present

## 2018-02-23 DIAGNOSIS — Z86711 Personal history of pulmonary embolism: Secondary | ICD-10-CM | POA: Diagnosis not present

## 2018-02-23 DIAGNOSIS — C778 Secondary and unspecified malignant neoplasm of lymph nodes of multiple regions: Secondary | ICD-10-CM | POA: Diagnosis not present

## 2018-02-23 DIAGNOSIS — D638 Anemia in other chronic diseases classified elsewhere: Secondary | ICD-10-CM | POA: Diagnosis not present

## 2018-02-23 DIAGNOSIS — Z9049 Acquired absence of other specified parts of digestive tract: Secondary | ICD-10-CM | POA: Diagnosis not present

## 2018-02-23 DIAGNOSIS — C7801 Secondary malignant neoplasm of right lung: Secondary | ICD-10-CM | POA: Diagnosis not present

## 2018-02-23 DIAGNOSIS — C7931 Secondary malignant neoplasm of brain: Secondary | ICD-10-CM | POA: Diagnosis not present

## 2018-02-23 DIAGNOSIS — Z7901 Long term (current) use of anticoagulants: Secondary | ICD-10-CM | POA: Diagnosis not present

## 2018-02-23 DIAGNOSIS — K5909 Other constipation: Secondary | ICD-10-CM | POA: Diagnosis not present

## 2018-02-23 DIAGNOSIS — C7951 Secondary malignant neoplasm of bone: Secondary | ICD-10-CM | POA: Diagnosis not present

## 2018-02-23 DIAGNOSIS — M2550 Pain in unspecified joint: Secondary | ICD-10-CM | POA: Diagnosis not present

## 2018-02-23 DIAGNOSIS — Z7401 Bed confinement status: Secondary | ICD-10-CM | POA: Diagnosis not present

## 2018-02-23 DIAGNOSIS — G8221 Paraplegia, complete: Secondary | ICD-10-CM | POA: Diagnosis not present

## 2018-02-23 DIAGNOSIS — R601 Generalized edema: Secondary | ICD-10-CM | POA: Diagnosis not present

## 2018-02-23 DIAGNOSIS — M6281 Muscle weakness (generalized): Secondary | ICD-10-CM | POA: Diagnosis not present

## 2018-02-23 DIAGNOSIS — E039 Hypothyroidism, unspecified: Secondary | ICD-10-CM | POA: Diagnosis not present

## 2018-02-23 DIAGNOSIS — K219 Gastro-esophageal reflux disease without esophagitis: Secondary | ICD-10-CM | POA: Diagnosis not present

## 2018-02-23 DIAGNOSIS — I10 Essential (primary) hypertension: Secondary | ICD-10-CM | POA: Diagnosis not present

## 2018-02-23 DIAGNOSIS — C7802 Secondary malignant neoplasm of left lung: Secondary | ICD-10-CM | POA: Diagnosis not present

## 2018-02-23 DIAGNOSIS — D34 Benign neoplasm of thyroid gland: Secondary | ICD-10-CM | POA: Diagnosis not present

## 2018-02-23 DIAGNOSIS — R5381 Other malaise: Secondary | ICD-10-CM | POA: Diagnosis not present

## 2018-02-23 DIAGNOSIS — R740 Nonspecific elevation of levels of transaminase and lactic acid dehydrogenase [LDH]: Secondary | ICD-10-CM | POA: Diagnosis not present

## 2018-02-23 DIAGNOSIS — K5900 Constipation, unspecified: Secondary | ICD-10-CM | POA: Diagnosis not present

## 2018-02-23 DIAGNOSIS — R945 Abnormal results of liver function studies: Secondary | ICD-10-CM | POA: Diagnosis not present

## 2018-02-23 DIAGNOSIS — G8220 Paraplegia, unspecified: Secondary | ICD-10-CM | POA: Diagnosis not present

## 2018-02-23 DIAGNOSIS — J189 Pneumonia, unspecified organism: Secondary | ICD-10-CM | POA: Diagnosis not present

## 2018-02-23 DIAGNOSIS — D631 Anemia in chronic kidney disease: Secondary | ICD-10-CM | POA: Diagnosis not present

## 2018-02-23 DIAGNOSIS — R888 Abnormal findings in other body fluids and substances: Secondary | ICD-10-CM | POA: Diagnosis not present

## 2018-02-23 DIAGNOSIS — D709 Neutropenia, unspecified: Secondary | ICD-10-CM | POA: Diagnosis not present

## 2018-02-23 MED ORDER — ALBUTEROL SULFATE (2.5 MG/3ML) 0.083% IN NEBU: 2.5000 mg | INHALATION_SOLUTION | Freq: Four times a day (QID) | RESPIRATORY_TRACT | 12 refills | Status: DC | PRN

## 2018-02-23 MED ORDER — PRO-STAT SUGAR FREE PO LIQD: 30.0000 mL | Freq: Three times a day (TID) | ORAL | 0 refills | Status: DC

## 2018-02-23 MED ORDER — DEXTROMETHORPHAN POLISTIREX ER 30 MG/5ML PO SUER: 30.0000 mg | Freq: Two times a day (BID) | ORAL | 0 refills | Status: DC | PRN

## 2018-02-23 MED ORDER — IPRATROPIUM-ALBUTEROL 0.5-2.5 (3) MG/3ML IN SOLN: 3.0000 mL | Freq: Three times a day (TID) | RESPIRATORY_TRACT | Status: DC

## 2018-02-23 MED ORDER — OXYCODONE HCL 5 MG PO TABS: 5.0000 mg | ORAL_TABLET | Freq: Four times a day (QID) | ORAL | 0 refills | Status: DC | PRN

## 2018-02-23 MED ORDER — ENSURE ENLIVE PO LIQD: 237.0000 mL | Freq: Three times a day (TID) | ORAL | 12 refills | Status: DC

## 2018-02-23 MED ORDER — SENNA 8.6 MG PO TABS: 1.0000 | ORAL_TABLET | Freq: Every evening | ORAL | 0 refills | Status: DC | PRN

## 2018-02-23 MED ORDER — POTASSIUM CHLORIDE CRYS ER 20 MEQ PO TBCR: 40.0000 meq | EXTENDED_RELEASE_TABLET | ORAL | Status: DC

## 2018-02-23 MED ORDER — FUROSEMIDE 40 MG PO TABS: 40.0000 mg | ORAL_TABLET | ORAL | Status: DC

## 2018-02-23 MED ORDER — PREDNISONE 5 MG PO TABS: ORAL_TABLET | ORAL | 0 refills | Status: DC

## 2018-02-23 MED ORDER — ADULT MULTIVITAMIN W/MINERALS CH: 1.0000 | ORAL_TABLET | Freq: Every day | ORAL | Status: AC

## 2018-02-23 NOTE — Discharge Summary (Signed)
Physician Discharge Summary  Sarah [name]:[id number] DOB: May 22, 1965 DOA: 02/03/2018  PCP: [name], MD Oncologist: [name]  Admit date: 02/03/2018 Discharge date: 02/23/2018  Disposition: SNF  Recommendations for Outpatient Follow-up:  1. Follow up with oncologist in 1 weeks to discuss further cancer treatments 2. Please consult palliative medicine to continue goals of care discussions. At this time patient desires full scope treatment.   Discharge Condition: STABLE   CODE STATUS: FULL    Brief Hospitalization Summary: Please see all hospital notes, images, labs for full details of the hospitalization.  HPI:    Sarah [name]  is a 52 y.o. 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  Patient states that symptoms have been going on for past 3 days.  She also complains of nausea but no vomiting.  Denies any fever.  Denies chest pain. In the ED, chest x-ray showed masslike opacity in right upper lung likely infectious given the rapid development since December 13, 2017.  Also infiltrate in left base likely pneumonia. Patient started on ceftriaxone and Zithromax.  Blood cultures x2 obtained. Denies previous history of stroke or seizures. No history of CAD.  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 Patient states that symptoms have been going on for past 3 days. She also complains of nausea but no vomiting. Denies any fever. Denies chest pain.  Pt was admitted with a multifocal pneumonia.  Newly discovered brain mets seen on CT  brain.   Summary of Hospital Problems Addressed:   1. Healthcare associated pneumonia-Pt is clinically improving.  Chest x-ray shows masslike opacity, likely infectious as it developed quickly since December 13, 2017. CT confirmed a multifocal pneumonia.  Pt was initially started on ceftriaxone and Zithromax but given the severity of infection and recent postop status, coverage was broadened to cefepime/vancomycin.  MRSA (+).  Blood cultures no growth.  Strep pneumo and legionella antigens negative.  Progress has been slow because she has a very weak cough reflex and poor inspiratory effort but it is improving.  Completed mucomyst nebs. Weaning steroids.  completed chest physiotherapy with percussion.  Consulted pulmonary team to assist with management. Continue lasix. Echo with normal EF.  De-escalated antibiotics doxycycline 11/27 to 11/30.    2. History of pulmonary embolism-continued on Xarelto for full anticoagulation.  3. 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 Pt has been treated with radiation and chemotherapy.  Pt has been followed by Neurosurgeon Dr. Christella Noa and oncologist [name].  4. Newly discovered Brain Mets - Pt's CT head shows metastases to brain with dural involvement, MRI recommended, patient has not been medically stable enough to tolerate MRI.  This can be pursued as an outpatient. Dr. Wynetta Emery spoke with and updated her oncologist Dr Irene Limbo 11/25 per patient request.  Pt plans to follow up with him.  Palliative medicine met with patient to discuss goals of care and pt desires to continue full scope treatment.   4. Hypothyroidism-continue Synthroid.  5. Hypotension -Resolved now.  Soft blood pressures likely multifactorial from dehydration, infection, anemia, paraplegia.  Pt is asymptomatic and  MAP>65.    6. Anemia of chronic disease - Transfused 2 units PRBC 11/15 with improvement of hemoglobin.  Appreciate  GI consult and assistance.  No gross GI bleeding. CT abd/pelvis negative for retroperitoneal bleed or hematoma.  Hg up to 9 after transfusion.  Hg on 11/25 back down to 7.4 and patient is symptomatic. GI has seen and don't plan for endoscopy at this time given patient's current condition, transfused additional 2 units PRBC 11/25.  Pt has recently tested hemoccult negative during this admission and seen by GI.  No source of bleeding found, resume xarelto.     7. Hypokalemia -replaced.  Magnesium is normal. 8. Stage 2 sacral pressure injury and stage 1 left heel injury - improving, continue wound care protocol. Consulted WOC nurse.  9. Fecal impaction - Treated and Resolved.  Was seen on CT abdomen/pelvis -feels better after receiving enemas.  Continue daily laxatives.  10. Anasarca.  Improved. Related to hypoalbuminemia.  Started on Lasix.  Patient has had good urine output.  Dietitian consulted to assist with protein supplementation.  Improved with Lasix 11. Pulmonary edema seen on CXR 11/22.  Treated with IV lasix.  Echocardiogram with normal EF and no diastolic dysfunction. No CHF suspected.     DVT Prophylaxis-Xarelto  Disposition : patient medically ready to discharge to SNF [facility]  Family Communication: family by telephone  Code Status:Full code   Consultants:  GI  Palliative care   Antimicrobials:  Ceftriaxone 11/14 >11/16  Azithromycin 11/14>11/16  Vancomycin 11/16 >11/19, 11/22-  Cefepime 11/16 >11/26  Doxycycline 11/27 > 11/30   Pressure Injury Documentation: Pressure Injury 02/04/18 Stage II -  Partial thickness loss of dermis presenting as a shallow open ulcer with a red, pink wound bed without slough. (Active)  02/04/18 0130   Location: Sacrum  Location Orientation: Mid  Staging: Stage II -  Partial thickness loss of dermis presenting as a shallow open ulcer with a red, pink wound bed without slough.  Wound Description (Comments):   Present on  Admission: Yes     Pressure Injury 02/04/18 Stage I -  Intact skin with non-blanchable redness of a localized area usually over a bony prominence. (Active)  02/04/18 0120   Location: Heel  Location Orientation: Left  Staging: Stage I -  Intact skin with non-blanchable redness of a localized area usually over a bony prominence.  Wound Description (Comments):   Present on Admission: Yes   Echocardiogram 02/12/18 Study Conclusions  - Left ventricle: The cavity size was normal. Wall thickness was increased in a pattern of mild LVH. Systolic function was normal. The estimated ejection fraction was in the range of 55% to 60%. Wall motion was normal; there were no regional wall motion abnormalities. Left ventricular diastolic function parameters were normal. - Aortic valve: Valve area (Vmax): 1.78 cm^2. - Atrial septum: No defect or patent foramen ovale was identified.  Discharge Diagnoses:  Active Problems:   Hurthle cell neoplasm of thyroid   Paraplegia at T4 level [facility])   Constipation   Constipation due to neurogenic bowel   Closed fracture of left distal femur (HCC)   Pressure injury of skin   Anemia   Chronic anticoagulation   Fecal impaction (HCC)   HCAP (healthcare-associated pneumonia)   Community acquired pneumonia   Palliative care by specialist   Goals of care, counseling/discussion   DNR (do not resuscitate) discussion   Dyspnea   Multifocal pneumonia  Discharge Instructions:  Allergies as of 02/23/2018  Reactions   Bee Venom Anaphylaxis   Keflex [cephalexin] Nausea And Vomiting   Tramadol Nausea And Vomiting   Gabapentin Nausea And Vomiting   Heparin Other (See Comments)   Weak positive platelets induced antibodies, SRA negative--2015   Hydrochlorothiazide Nausea And Vomiting   Hydrocodone Nausea And Vomiting   Oxycodone-acetaminophen Nausea And Vomiting   Sorbitol Nausea Only   And abdominal distress   Tizanidine    Fatigue       Medication List    STOP taking these medications   fluticasone 50 MCG/ACT nasal spray Commonly known as:  FLONASE   oxyCODONE-acetaminophen 5-325 MG tablet Commonly known as:  PERCOCET/ROXICET   sodium phosphate 7-19 GM/118ML Enem   sucralfate 1 g tablet Commonly known as:  CARAFATE   sucralfate 1 GM/10ML suspension Commonly known as:  CARAFATE   tiZANidine 2 MG tablet Commonly known as:  ZANAFLEX     TAKE these medications   acetaminophen 325 MG tablet Commonly known as:  TYLENOL Take 2 tablets (650 mg total) by mouth every 4 (four) hours as needed for mild pain ((score 1 to 3) or temp > 100.5).   albuterol (2.5 MG/3ML) 0.083% nebulizer solution Commonly known as:  PROVENTIL Take 3 mLs (2.5 mg total) by nebulization every 6 (six) hours as needed for wheezing or shortness of breath.   baclofen 10 MG tablet Commonly known as:  LIORESAL Take 25 mg by mouth 4 (four) times daily as needed for muscle spasms.   dextromethorphan 30 MG/5ML liquid Commonly known as:  DELSYM Take 5 mLs (30 mg total) by mouth 2 (two) times daily as needed for cough.   famotidine 20 MG tablet Commonly known as:  PEPCID Take 1 tablet (20 mg total) by mouth 2 (two) times daily.   feeding supplement (ENSURE ENLIVE) Liqd Take 237 mLs by mouth 3 (three) times daily between meals.   feeding supplement (PRO-STAT SUGAR FREE 64) Liqd Take 30 mLs by mouth 3 (three) times daily between meals.   furosemide 40 MG tablet Commonly known as:  LASIX Take 1 tablet (40 mg total) by mouth every other day. Start taking on:  02/25/2018   ipratropium-albuterol 0.5-2.5 (3) MG/3ML Soln Commonly known as:  DUONEB Take 3 mLs by nebulization 3 (three) times daily.   levothyroxine 150 MCG tablet Commonly known as:  SYNTHROID, LEVOTHROID Take 150 mcg by mouth daily before breakfast.   loratadine 10 MG tablet Commonly known as:  CLARITIN Take 1 tablet (10 mg total) by mouth daily.   multivitamin with minerals  Tabs tablet Take 1 tablet by mouth daily. Start taking on:  02/24/2018   ondansetron 4 MG tablet Commonly known as:  ZOFRAN Take 1 tablet by mouth every 6 (six) hours as needed for nausea or vomiting.   oxyCODONE 5 MG immediate release tablet Commonly known as:  Oxy IR/ROXICODONE Take 1 tablet (5 mg total) by mouth every 6 (six) hours as needed for severe pain. What changed:    when to take this  reasons to take this   pantoprazole 40 MG tablet Commonly known as:  PROTONIX Take 1 tablet by mouth daily.   polyethylene glycol packet Commonly known as:  MIRALAX / GLYCOLAX Take 17 g by mouth daily.   potassium chloride SA 20 MEQ tablet Commonly known as:  K-DUR,KLOR-CON Take 2 tablets (40 mEq total) by mouth every other day. Start taking on:  02/25/2018   predniSONE 5 MG tablet Commonly known as:  DELTASONE Take 1 daily with breakfast  x 5 days, then 0.5 tab daily x 5 days, then stop Start taking on:  02/24/2018   rivaroxaban 20 MG Tabs tablet Commonly known as:  XARELTO Take 20 mg by mouth daily with supper.   senna 8.6 MG Tabs tablet Commonly known as:  SENOKOT Take 1 tablet (8.6 mg total) by mouth at bedtime as needed for mild constipation. What changed:    when to take this  reasons to take this   sertraline 50 MG tablet Commonly known as:  ZOLOFT       Contact information for follow-up providers    Brunetta Genera, MD. Schedule an appointment as soon as possible for a visit in 1 week(s).   Specialties:  Hematology, Oncology Why:  Hospital Follow Up  Contact information: [place] [address] [phone]            Contact information for after-discharge care    Destination    [facility] .   Service:  Skilled Nursing Contact information: [place] [address] [phone]                 Allergies  Allergen Reactions  . Bee Venom Anaphylaxis  . Keflex  [Cephalexin] Nausea And Vomiting  . Tramadol Nausea And Vomiting  . Gabapentin Nausea And Vomiting  . Heparin Other (See Comments)    Weak positive platelets induced antibodies, SRA negative--2015  . Hydrochlorothiazide Nausea And Vomiting  . Hydrocodone Nausea And Vomiting  . Oxycodone-Acetaminophen Nausea And Vomiting  . Sorbitol Nausea Only    And abdominal distress  . Tizanidine     Fatigue    Allergies as of 02/23/2018      Reactions   Bee Venom Anaphylaxis   Keflex [cephalexin] Nausea And Vomiting   Tramadol Nausea And Vomiting   Gabapentin Nausea And Vomiting   Heparin Other (See Comments)   Weak positive platelets induced antibodies, SRA negative--2015   Hydrochlorothiazide Nausea And Vomiting   Hydrocodone Nausea And Vomiting   Oxycodone-acetaminophen Nausea And Vomiting   Sorbitol Nausea Only   And abdominal distress   Tizanidine    Fatigue      Medication List    STOP taking these medications   fluticasone 50 MCG/ACT nasal spray Commonly known as:  FLONASE   oxyCODONE-acetaminophen 5-325 MG tablet Commonly known as:  PERCOCET/ROXICET   sodium phosphate 7-19 GM/118ML Enem   sucralfate 1 g tablet Commonly known as:  CARAFATE   sucralfate 1 GM/10ML suspension Commonly known as:  CARAFATE   tiZANidine 2 MG tablet Commonly known as:  ZANAFLEX     TAKE these medications   acetaminophen 325 MG tablet Commonly known as:  TYLENOL Take 2 tablets (650 mg total) by mouth every 4 (four) hours as needed for mild pain ((score 1 to 3) or temp > 100.5).   albuterol (2.5 MG/3ML) 0.083% nebulizer solution Commonly known as:  PROVENTIL Take 3 mLs (2.5 mg total) by nebulization every 6 (six) hours as needed for wheezing or shortness of breath.   baclofen 10 MG tablet Commonly known as:  LIORESAL Take 25 mg by mouth 4 (four) times daily as needed for muscle spasms.   dextromethorphan 30 MG/5ML liquid Commonly known as:  DELSYM Take 5 mLs (30 mg total) by mouth 2  (two) times daily as needed for cough.   famotidine 20 MG tablet Commonly known as:  PEPCID Take 1 tablet (20 mg total) by mouth 2 (two) times daily.  feeding supplement (ENSURE ENLIVE) Liqd Take 237 mLs by mouth 3 (three) times daily between meals.   feeding supplement (PRO-STAT SUGAR FREE 64) Liqd Take 30 mLs by mouth 3 (three) times daily between meals.   furosemide 40 MG tablet Commonly known as:  LASIX Take 1 tablet (40 mg total) by mouth every other day. Start taking on:  02/25/2018   ipratropium-albuterol 0.5-2.5 (3) MG/3ML Soln Commonly known as:  DUONEB Take 3 mLs by nebulization 3 (three) times daily.   levothyroxine 150 MCG tablet Commonly known as:  SYNTHROID, LEVOTHROID Take 150 mcg by mouth daily before breakfast.   loratadine 10 MG tablet Commonly known as:  CLARITIN Take 1 tablet (10 mg total) by mouth daily.   multivitamin with minerals Tabs tablet Take 1 tablet by mouth daily. Start taking on:  02/24/2018   ondansetron 4 MG tablet Commonly known as:  ZOFRAN Take 1 tablet by mouth every 6 (six) hours as needed for nausea or vomiting.   oxyCODONE 5 MG immediate release tablet Commonly known as:  Oxy IR/ROXICODONE Take 1 tablet (5 mg total) by mouth every 6 (six) hours as needed for severe pain. What changed:    when to take this  reasons to take this   pantoprazole 40 MG tablet Commonly known as:  PROTONIX Take 1 tablet by mouth daily.   polyethylene glycol packet Commonly known as:  MIRALAX / GLYCOLAX Take 17 g by mouth daily.   potassium chloride SA 20 MEQ tablet Commonly known as:  K-DUR,KLOR-CON Take 2 tablets (40 mEq total) by mouth every other day. Start taking on:  02/25/2018   predniSONE 5 MG tablet Commonly known as:  DELTASONE Take 1 daily with breakfast x 5 days, then 0.5 tab daily x 5 days, then stop Start taking on:  02/24/2018   rivaroxaban 20 MG Tabs tablet Commonly known as:  XARELTO Take 20 mg by mouth daily with  supper.   senna 8.6 MG Tabs tablet Commonly known as:  SENOKOT Take 1 tablet (8.6 mg total) by mouth at bedtime as needed for mild constipation. What changed:    when to take this  reasons to take this   sertraline 50 MG tablet Commonly known as:  ZOLOFT       Procedures/Studies: Ct Abdomen Pelvis Wo Contrast  Result Date: 02/05/2018 CLINICAL DATA:  52 year old female with abdominal pain. Anemia and decreased blood pressures. Query retroperitoneal bleed. Community-acquired pneumonia. Recent laparoscopic cholecystectomy. Metastatic thyroid carcinoma, and paraplegia at the T4 level. EXAM: CT ABDOMEN AND PELVIS WITHOUT CONTRAST TECHNIQUE: Multidetector CT imaging of the abdomen and pelvis was performed following the standard protocol without IV contrast. COMPARISON:  Preoperative CT Abdomen and Pelvis [facility] 12/13/2017. FINDINGS: Lower chest: Small bilateral layering pleural effusions, stable on the left since September but increased on the right. Confluent posterior basal segment left lower lobe consolidation with air bronchograms is new since September. Widespread additional bilateral lower lobe patchy and confluent peribronchial opacity. There is also right middle lobe involvement (series 4, image 3). No pericardial effusion. Hepatobiliary: Appearance of recent cholecystectomy with small volume postoperative gas and fluid in the gallbladder bed. No other perihepatic fluid and otherwise negative noncontrast appearance of the liver. Pancreas: Negative. Spleen: Negative. Adrenals/Urinary Tract: Normal adrenal glands. Stable and negative noncontrast kidneys. No retroperitoneal hemorrhage in the abdomen or pelvis. Decompressed ureters. Unremarkable urinary bladder. Stomach/Bowel: Abundant rectosigmoid retained stool, increased since September. Chronic circumferential rectal wall thickening. Highly redundant sigmoid colon. The proximal sigmoid and descending  are fairly decompressed.  Mild retained stool in the transverse colon similar to the prior study. Negative right colon and appendix. The cecum is on a lax mesentery located anteriorly near the midline. Negative terminal ileum. No dilated small bowel. Decompressed stomach and duodenum. No free air. Vascular/Lymphatic: Vascular patency is not evaluated in the absence of IV contrast. No lymphadenopathy. Reproductive: Negative noncontrast appearance aside from the adjacent free fluid. Other: Mild presacral stranding is stable since September. New small volume simple appearing free fluid in the anterior pelvis anterior to the rectum (series 2, image 77). Musculoskeletal: Stable visualized osseous structures. Advanced degenerative changes in the lumbar spine. Previous proximal left femur ORIF. There is bilateral body wall edema which is increased compared to September. IMPRESSION: 1. Negative for retroperitoneal hemorrhage. No hematoma identified in the abdomen or pelvis. 2. Bilateral multilobar pneumonia, with dense left lower lobe consolidation. Increased right and stable left small pleural effusions. 3. Satisfactory appearance of recent post cholecystectomy changes. 4. Small volume of simple appearing fluid in the pelvis is nonspecific but may be related to #3. 5. Increased rectosigmoid stool since September raising the possibility of fecal impaction. Electronically Signed   By: Genevie Ann M.D.   On: 02/05/2018 17:44   Dg Chest 2 View  Result Date: 02/03/2018 CLINICAL DATA:  Cough. EXAM: CHEST - 2 VIEW COMPARISON:  December 13, 2017 FINDINGS: There is a masslike opacity in the right upper lung, not seen in September 2019. There is infiltrate in the left retrocardiac region as well. No other interval changes or acute abnormalities. IMPRESSION: 1. The masslike opacity in the right upper lung is most likely infectious given the rapid development since December 13, 2017. There is also infiltrate in the left base, likely pneumonia. Recommend  short-term follow-up after treatment to ensure resolution. Electronically Signed   By: Dorise Bullion III M.D   On: 02/03/2018 22:03   Ct Head W & Wo Contrast  Result Date: 02/13/2018 CLINICAL DATA:  Altered level of consciousness for a few weeks. History of metastatic thyroid cancer. EXAM: CT HEAD WITHOUT AND WITH CONTRAST TECHNIQUE: Contiguous axial images were obtained from the base of the skull through the vertex without and with intravenous contrast CONTRAST:  25m OMNIPAQUE IOHEXOL 300 MG/ML  SOLN COMPARISON:  CT HEAD September 02, 2017 FINDINGS: BRAIN: No intraparenchymal hemorrhage, mass effect nor midline shift. Borderline parenchymal brain volume loss for age. No hydrocephalus. No acute large vascular territory infarcts. No abnormal extra-axial fluid collections. Basal cisterns are patent. VASCULAR: Mild calcific atherosclerosis carotid siphon. SKULL/SOFT TISSUES: No skull fracture. 19 mm more conspicuous lytic lesion LEFT parietal calvarium (diploic space to inner table) with slight underlying dural enhancement. Small RIGHT frontal scalp lipoma. No significant soft tissue swelling. ORBITS/SINUSES: The included ocular globes and orbital contents are normal.Mild paranasal sinus mucosal thickening. Trace RIGHT mastoid effusion. OTHER: None. IMPRESSION: 1. No acute intracranial process. 2. 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 Recommend MRI of the brain with and without contrast. 3. Borderline parenchymal brain volume loss for age. 4. These results will be called to the ordering clinician or representative by the professional radiologist assistant, and communication documented in [employer] Dashboard. Electronically Signed   By: CElon AlasM.D.   On: 02/13/2018 18:13   Ct Chest W Contrast  Result Date: 02/15/2018 CLINICAL DATA:  Acute blood-loss anemia. Concern for retroperitoneal bleed. Metastatic thyroid cancer. Abdominal distension. EXAM: CT CHEST, ABDOMEN AND  PELVIS WITHOUT CONTRAST TECHNIQUE: Multidetector CT imaging of the  chest, abdomen and pelvis was performed following the standard protocol without IV contrast. COMPARISON:  Abdominopelvic CT 10 days ago 02/05/2018. Thoracic spine CT 10/26/2017 PET CT 05/20/2017 FINDINGS: CT CHEST FINDINGS Cardiovascular: Right upper extremity PICC with tip in the distal SVC. No central pulmonary embolus. Thoracic aorta is normal in caliber. No evidence of dissection. Normal heart size. No pericardial effusion. Mediastinum/Nodes: Supraclavicular regions partially obscured by patient's arms. No enlarged mediastinal or hilar lymph nodes. Thyroid gland is not well visualized and is likely surgically absent. The esophagus is decompressed. Lungs/Pleura: Moderate bilateral pleural effusions, slightly increased in size from included portion of abdominal CT 02/05/2018. Fluid measures simple density without evidence of hemothorax. There are multifocal patchy opacities throughout all lobes of both lungs. Septal thickening in the lower lobes. Biapical consolidations with bronchiectasis. Retained mucus or debris in the dependent trachea and right mainstem bronchus. Pulmonary nodules on prior PET are obscured by ground-glass opacities and apical consolidations. Musculoskeletal: Known T3 metastatic disease with posterior fusion T1 through T5, better assessed on prior thoracic myelography. Lesion involves the posterior aspect of adjacent T2 and T4 vertebral bodies with severe T3 compression fracture as before. Spinal canal involvement not well assessed on the current exam. No new compression fracture. CT ABDOMEN PELVIS FINDINGS Hepatobiliary: No focal hepatic lesion. Postcholecystectomy with clips in the gallbladder fossa. Persistent fluid in the operative bed with slightly complex fluid but resolution of air. No peripheral enhancement. No other perihepatic fluid. No biliary dilatation. Pancreas: Negative.  No ductal dilatation or inflammation.  Spleen: Normal in size without focal abnormality. No perisplenic fluid. Adrenals/Urinary Tract: Normal adrenal glands. No hydronephrosis or perinephric edema. Homogeneous renal enhancement with symmetric excretion on delayed phase imaging. Urinary bladder is physiologically distended without wall thickening. Stomach/Bowel: Slight rectal wall thickening. No other bowel wall thickening, inflammatory change or obstruction. Enteric contrast reaches the colon. Sigmoid colonic redundancy as before. Stomach is physiologically distended. Normal appendix. No mesenteric hematoma. Vascular/Lymphatic: No retroperitoneal stranding or hemorrhage. Abdominal aorta and IVC are intact. No enlarged abdominal or pelvic lymph nodes. Reproductive: Status post hysterectomy. No adnexal masses. Small amount of free fluid in the pelvis, slightly diminished from prior. Other: Small amount of free fluid in the pelvis measures simple fluid density. No evidence of hemoperitoneum. Subcutaneous edema in the abdominal wall/flanks. No free air. Musculoskeletal: Multilevel degenerative change in the lumbar spine. Left proximal femur hardware is partially included. No obvious osseous lesion. Spinal canal a soft tissue density at L1-L2 on prior myelography is not well seen currently. IMPRESSION: 1. No evidence of active bleeding in the chest, abdomen, or pelvis. No retroperitoneal bleed. 2. Moderate bilateral pleural effusions. Multifocal ground-glass opacities throughout both lungs may be pulmonary edema or pneumonia. Mild septal thickening suggests an element of pulmonary edema. 3. Biapical consolidations with bronchiectasis may represent postradiation change, recommend correlation with treatment history. 4. 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 5. Recent cholecystectomy with small amount of residual complex fluid in the operative bed. This may represent postoperative seroma,  there is no peripheral enhancement to suggest superimposed infection. Biloma is not entirely excluded, however lack of significant increase compared to CT 10 days ago argues against this, and there is no other perihepatic fluid. 6. Small amount of free fluid in the pelvis, slightly diminished from prior exam. 7. Confluent edema in the body wall/flanks, similar to prior. Electronically Signed   By: Keith Rake M.D.   On: 02/15/2018 23:31   Ct  Abdomen Pelvis W Contrast  Result Date: 02/15/2018 CLINICAL DATA:  Acute blood-loss anemia. Concern for retroperitoneal bleed. Metastatic thyroid cancer. Abdominal distension. EXAM: CT CHEST, ABDOMEN AND PELVIS WITHOUT CONTRAST TECHNIQUE: Multidetector CT imaging of the chest, abdomen and pelvis was performed following the standard protocol without IV contrast. COMPARISON:  Abdominopelvic CT 10 days ago 02/05/2018. Thoracic spine CT 10/26/2017 PET CT 05/20/2017 FINDINGS: CT CHEST FINDINGS Cardiovascular: Right upper extremity PICC with tip in the distal SVC. No central pulmonary embolus. Thoracic aorta is normal in caliber. No evidence of dissection. Normal heart size. No pericardial effusion. Mediastinum/Nodes: Supraclavicular regions partially obscured by patient's arms. No enlarged mediastinal or hilar lymph nodes. Thyroid gland is not well visualized and is likely surgically absent. The esophagus is decompressed. Lungs/Pleura: Moderate bilateral pleural effusions, slightly increased in size from included portion of abdominal CT 02/05/2018. Fluid measures simple density without evidence of hemothorax. There are multifocal patchy opacities throughout all lobes of both lungs. Septal thickening in the lower lobes. Biapical consolidations with bronchiectasis. Retained mucus or debris in the dependent trachea and right mainstem bronchus. Pulmonary nodules on prior PET are obscured by ground-glass opacities and apical consolidations. Musculoskeletal: Known T3 metastatic  disease with posterior fusion T1 through T5, better assessed on prior thoracic myelography. Lesion involves the posterior aspect of adjacent T2 and T4 vertebral bodies with severe T3 compression fracture as before. Spinal canal involvement not well assessed on the current exam. No new compression fracture. CT ABDOMEN PELVIS FINDINGS Hepatobiliary: No focal hepatic lesion. Postcholecystectomy with clips in the gallbladder fossa. Persistent fluid in the operative bed with slightly complex fluid but resolution of air. No peripheral enhancement. No other perihepatic fluid. No biliary dilatation. Pancreas: Negative.  No ductal dilatation or inflammation. Spleen: Normal in size without focal abnormality. No perisplenic fluid. Adrenals/Urinary Tract: Normal adrenal glands. No hydronephrosis or perinephric edema. Homogeneous renal enhancement with symmetric excretion on delayed phase imaging. Urinary bladder is physiologically distended without wall thickening. Stomach/Bowel: Slight rectal wall thickening. No other bowel wall thickening, inflammatory change or obstruction. Enteric contrast reaches the colon. Sigmoid colonic redundancy as before. Stomach is physiologically distended. Normal appendix. No mesenteric hematoma. Vascular/Lymphatic: No retroperitoneal stranding or hemorrhage. Abdominal aorta and IVC are intact. No enlarged abdominal or pelvic lymph nodes. Reproductive: Status post hysterectomy. No adnexal masses. Small amount of free fluid in the pelvis, slightly diminished from prior. Other: Small amount of free fluid in the pelvis measures simple fluid density. No evidence of hemoperitoneum. Subcutaneous edema in the abdominal wall/flanks. No free air. Musculoskeletal: Multilevel degenerative change in the lumbar spine. Left proximal femur hardware is partially included. No obvious osseous lesion. Spinal canal a soft tissue density at L1-L2 on prior myelography is not well seen currently. IMPRESSION: 1. No  evidence of active bleeding in the chest, abdomen, or pelvis. No retroperitoneal bleed. 2. Moderate bilateral pleural effusions. Multifocal ground-glass opacities throughout both lungs may be pulmonary edema or pneumonia. Mild septal thickening suggests an element of pulmonary edema. 3. Biapical consolidations with bronchiectasis may represent postradiation change, recommend correlation with treatment history. 4. 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 5. Recent cholecystectomy with small amount of residual complex fluid in the operative bed. This may represent postoperative seroma, there is no peripheral enhancement to suggest superimposed infection. Biloma is not entirely excluded, however lack of significant increase compared to CT 10 days ago argues against this, and there is no other perihepatic fluid. 6. Small amount of  free fluid in the pelvis, slightly diminished from prior exam. 7. Confluent edema in the body wall/flanks, similar to prior. Electronically Signed   By: Keith Rake M.D.   On: 02/15/2018 23:31   Dg Chest Port 1 View  Result Date: 02/16/2018 CLINICAL DATA:  Follow-up multifocal pneumonia EXAM: PORTABLE CHEST 1 VIEW COMPARISON:  02/15/2018 FINDINGS: Cardiac shadow remains enlarged. Bilateral pleural effusions are again seen. Biapical consolidative changes are again identified and unchanged. No new focal infiltrate is seen. Postsurgical changes at the cervicothoracic junction are noted. Right-sided PICC line is seen in satisfactory position. IMPRESSION: Overall stable appearance of the chest with biapical consolidation. Electronically Signed   By: Inez Catalina M.D.   On: 02/16/2018 07:49   Dg Chest Port 1 View  Result Date: 02/13/2018 CLINICAL DATA:  Follow-up exam.  Pulmonary edema EXAM: PORTABLE CHEST 1 VIEW COMPARISON:  Chest radiograph 02/12/2018 FINDINGS: Right upper extremity PICC line tip projects over  the superior vena cava. Stable cardiac and mediastinal contours. Re-demonstrated bilateral predominately upper lung airspace consolidation. Mid lower lung patchy consolidation is grossly similar. Probable small left pleural effusion. No pneumothorax. IMPRESSION: Similar-appearing bilateral consolidation, upper lobe predominant, potentially multifocal pneumonia. Electronically Signed   By: Lovey Newcomer M.D.   On: 02/13/2018 09:02   Dg Chest Port 1 View  Result Date: 02/12/2018 CLINICAL DATA:  Shortness of breath.  History of thyroid carcinoma EXAM: PORTABLE CHEST 1 VIEW COMPARISON:  February 05, 2018 FINDINGS: There is extensive airspace opacity in both upper lobes, more on the right than on the left, similar to prior study. There is underlying interstitial and patchy alveolar edema elsewhere, increased from most recent prior study. There is cardiomegaly with pulmonary venous hypertension. No adenopathy. Central catheter tip is at the cavoatrial junction. Postoperative changes are noted in the upper thoracic region, stable. IMPRESSION: Increase in interstitial and alveolar opacity, consistent with either progression of widespread pneumonia or congestive heart failure. Both entities may be present concurrently. Upper lobe consolidation, more on the right than on the left, persists. Cardiac silhouette is stable. Central catheter tip at cavoatrial junction. No pneumothorax. Electronically Signed   By: Lowella Grip III M.D.   On: 02/12/2018 07:41   Dg Chest Port 1 View  Result Date: 02/05/2018 CLINICAL DATA:  Pneumonia.  History of thyroid carcinoma EXAM: PORTABLE CHEST 1 VIEW COMPARISON:  February 03, 2018 and December 13, 2017 FINDINGS: There is increase in consolidation in the right upper lobe. There is new airspace consolidation in the left upper lobe. There is also patchy airspace consolidation in the right base, new. There is stable consolidation in the medial left base. Heart is upper normal in size  with pulmonary vascularity normal. No appreciable adenopathy. Postoperative changes noted in the upper thoracic region. Trachea is midline. IMPRESSION: Multifocal pneumonia with progression compared to 2 days prior. Stable cardiac silhouette. Electronically Signed   By: Lowella Grip III M.D.   On: 02/05/2018 07:02      Subjective: Pt says that she feels much better today.    Discharge Exam: Vitals:   02/23/18 0616 02/23/18 1514  BP: 126/83 114/78  Pulse: 74 91  Resp:  18  Temp: 98.2 F (36.8 C) 98.7 F (37.1 C)  SpO2: 100% 99%   Vitals:   02/22/18 1916 02/22/18 2156 02/23/18 0616 02/23/18 1514  BP:  (!) 144/91 126/83 114/78  Pulse:  85 74 91  Resp:    18  Temp:  98.5 F (36.9 C) 98.2 F (36.8 C)  98.7 F (37.1 C)  TempSrc:  Oral Oral Oral  SpO2: 98% 100% 100% 99%  Weight:      Height:       General exam: Alert, awake, oriented x 3 Respiratory system: Clear to auscultation. Respiratory effort normal. Cardiovascular system:normal s1,s2 sounds. No murmurs, rubs, gallops. Gastrointestinal system: Abdomen is nondistended, soft and nontender. No organomegaly or masses felt. Normal bowel sounds heard. Neuro: Paraplegic.  Ext: no cyanosis or clubbing, trace pretibial edema bilateral LEs.    The results of significant diagnostics from this hospitalization (including imaging, microbiology, ancillary and laboratory) are listed below for reference.     Microbiology: No results found for this or any previous visit (from the past 240 hour(s)).   Labs: BNP (last 3 results) No results for input(s): BNP in the last 8760 hours. Basic Metabolic Panel: Recent Labs  Lab 02/17/18 0445 02/18/18 0450 02/21/18 0722 02/22/18 0810  NA 139 142 141  --   K 3.4* 3.5 3.5  --   CL 100 102 104  --   CO2 33* 34* 32  --   GLUCOSE 109* 106* 96  --   BUN 27* 31* 24*  --   CREATININE 1.00 1.14* 0.95 1.02*  CALCIUM 7.3* 7.4* 7.6*  --   MG 2.1  --   --   --    Liver Function Tests: No  results for input(s): AST, ALT, ALKPHOS, BILITOT, PROT, ALBUMIN in the last 168 hours. No results for input(s): LIPASE, AMYLASE in the last 168 hours. No results for input(s): AMMONIA in the last 168 hours. CBC: Recent Labs  Lab 02/18/18 0450 02/21/18 0722  WBC 6.4 3.8*  NEUTROABS 4.6  --   HGB 11.0* 9.9*  HCT 36.1 32.2*  MCV 90.9 92.8  PLT 102* 127*   Cardiac Enzymes: No results for input(s): CKTOTAL, CKMB, CKMBINDEX, TROPONINI in the last 168 hours. BNP: Invalid input(s): POCBNP CBG: No results for input(s): GLUCAP in the last 168 hours. D-Dimer No results for input(s): DDIMER in the last 72 hours. Hgb A1c No results for input(s): HGBA1C in the last 72 hours. Lipid Profile No results for input(s): CHOL, HDL, LDLCALC, TRIG, CHOLHDL, LDLDIRECT in the last 72 hours. Thyroid function studies Recent Labs    02/21/18 0722  TSH 20.897*   Anemia work up No results for input(s): VITAMINB12, FOLATE, FERRITIN, TIBC, IRON, RETICCTPCT in the last 72 hours. Urinalysis    Component Value Date/Time   COLORURINE YELLOW 10/13/2017 0802   APPEARANCEUR HAZY (A) 10/13/2017 0802   LABSPEC 1.013 10/13/2017 0802   PHURINE 5.0 10/13/2017 0802   GLUCOSEU NEGATIVE 10/13/2017 0802   HGBUR NEGATIVE 10/13/2017 0802   BILIRUBINUR NEGATIVE 10/13/2017 0802   BILIRUBINUR MODERATE 03/03/2014 1028   KETONESUR 20 (A) 10/13/2017 0802   PROTEINUR NEGATIVE 10/13/2017 0802   UROBILINOGEN 1.0 03/11/2014 2130   NITRITE POSITIVE (A) 10/13/2017 0802   LEUKOCYTESUR TRACE (A) 10/13/2017 0802   Sepsis Labs Invalid input(s): PROCALCITONIN,  WBC,  LACTICIDVEN Microbiology No results found for this or any previous visit (from the past 240 hour(s)).  Time coordinating discharge: 42 minutes  SIGNED:  Irwin Brakeman, MD  [facility] 02/23/2018, 3:15 PM Pager [phone]  If 7PM-7AM, please contact night-coverage [email or web address] Password [email or web address]

## 2018-02-23 NOTE — Progress Notes (Signed)
Report called to [facility]

## 2018-02-23 NOTE — Care Management Important Message (Signed)
Important Message  Patient Details  Name: Sarah [name] MRN: [id number] Date of Birth: [birth year]   Medicare Important Message Given:  Yes    Sherald Barge, RN 02/23/2018, 3:28 PM

## 2018-02-23 NOTE — Clinical Social Work Placement (Signed)
   CLINICAL SOCIAL WORK PLACEMENT  NOTE  Date:  02/23/2018  Patient Details  Name: Sarah [name] MRN: [id number] Date of Birth: Jul 09, 1965  Clinical Social Work is seeking post-discharge placement for this patient at the [place] level of care (*CSW will initial, date and re-position this form in  chart as items are completed):  Yes   Patient/family provided with [facility] Work Department's list of facilities offering this level of care within the geographic area requested by the patient (or if unable, by the patient's family).  Yes   Patient/family informed of their freedom to choose among providers that offer the needed level of care, that participate in Medicare, Medicaid or managed care program needed by the patient, have an available bed and are willing to accept the patient.  Yes   Patient/family informed of [facility]'s ownership interest in [facility] and [facility], as well as of the fact that they are under no obligation to receive care at these facilities.  PASRR submitted to EDS on       PASRR number received on       Existing PASRR number confirmed on 02/11/18     FL2 transmitted to all facilities in geographic area requested by pt/family on 02/11/18     FL2 transmitted to all facilities within larger geographic area on       Patient informed that his/her managed care company has contracts with or will negotiate with certain facilities, including the following:        Yes   Patient/family informed of bed offers received.  Patient chooses bed at [facility]     Physician recommends and patient chooses bed at      Patient to be transferred to [facility] [place] on 02/23/18.  Patient to be transferred to facility by [facility]`     Patient family notified on 02/23/18 of transfer.  Name of family member notified:  Tyler Deis     PHYSICIAN       Additional Comment:  Discharge clinicals sent to facility and  facility notified of discharge.  RN to call EMS transport. LCSW signing off.  _______________________________________________ Ihor Gully, LCSW 02/23/2018, 4:23 PM

## 2018-02-23 NOTE — Progress Notes (Signed)
EMS arrived to transport patient to [facility].  Patient left via stretcher accompanied by EMS staff in stable condition.  Belongings sent with patient.  No complications noted.  P.J. Linus Mako, RN

## 2018-02-24 DIAGNOSIS — C73 Malignant neoplasm of thyroid gland: Secondary | ICD-10-CM | POA: Diagnosis not present

## 2018-02-24 DIAGNOSIS — D638 Anemia in other chronic diseases classified elsewhere: Secondary | ICD-10-CM | POA: Diagnosis not present

## 2018-02-24 DIAGNOSIS — G8220 Paraplegia, unspecified: Secondary | ICD-10-CM | POA: Diagnosis not present

## 2018-02-25 DIAGNOSIS — D34 Benign neoplasm of thyroid gland: Secondary | ICD-10-CM | POA: Diagnosis not present

## 2018-02-25 DIAGNOSIS — Z9049 Acquired absence of other specified parts of digestive tract: Secondary | ICD-10-CM | POA: Diagnosis not present

## 2018-02-25 DIAGNOSIS — G8220 Paraplegia, unspecified: Secondary | ICD-10-CM | POA: Diagnosis not present

## 2018-02-25 DIAGNOSIS — J189 Pneumonia, unspecified organism: Secondary | ICD-10-CM | POA: Diagnosis not present

## 2018-02-26 ENCOUNTER — Telehealth: Payer: Self-pay | Admitting: *Deleted

## 2018-02-26 NOTE — Telephone Encounter (Signed)
Patient called - Asking what are next steps following CT scan at [facility]?  Per [name]: Contact patient to tell her that next steps will be discussed at appointment next week on 12/10. Patient contacted, No Voice Mail available.

## 2018-03-01 DIAGNOSIS — K219 Gastro-esophageal reflux disease without esophagitis: Secondary | ICD-10-CM | POA: Diagnosis not present

## 2018-03-01 DIAGNOSIS — J189 Pneumonia, unspecified organism: Secondary | ICD-10-CM | POA: Diagnosis not present

## 2018-03-01 DIAGNOSIS — I10 Essential (primary) hypertension: Secondary | ICD-10-CM | POA: Diagnosis not present

## 2018-03-02 ENCOUNTER — Encounter (HOSPITAL_COMMUNITY): Payer: Self-pay | Admitting: Emergency Medicine

## 2018-03-02 ENCOUNTER — Emergency Department (HOSPITAL_COMMUNITY)
Admission: EM | Admit: 2018-03-02 | Discharge: 2018-03-02 | Disposition: A | Discharge status: Home / Self Care | Payer: Medicare Other | Source: Home / Self Care | Attending: Emergency Medicine | Admitting: Emergency Medicine

## 2018-03-02 ENCOUNTER — Emergency Department (HOSPITAL_COMMUNITY): Payer: Medicare Other

## 2018-03-02 ENCOUNTER — Inpatient Hospital Stay (HOSPITAL_BASED_OUTPATIENT_CLINIC_OR_DEPARTMENT_OTHER): Payer: Medicare Other | Admitting: Hematology

## 2018-03-02 ENCOUNTER — Inpatient Hospital Stay: Payer: Medicare Other | Attending: Hematology

## 2018-03-02 ENCOUNTER — Telehealth: Payer: Self-pay | Admitting: Hematology

## 2018-03-02 VITALS — BP 121/86 | HR 89 | Temp 98.3°F | Resp 18 | Ht 71.0 in | Wt 214.0 lb

## 2018-03-02 DIAGNOSIS — C7931 Secondary malignant neoplasm of brain: Secondary | ICD-10-CM

## 2018-03-02 DIAGNOSIS — C7802 Secondary malignant neoplasm of left lung: Secondary | ICD-10-CM

## 2018-03-02 DIAGNOSIS — Z86711 Personal history of pulmonary embolism: Secondary | ICD-10-CM

## 2018-03-02 DIAGNOSIS — C7801 Secondary malignant neoplasm of right lung: Secondary | ICD-10-CM | POA: Insufficient documentation

## 2018-03-02 DIAGNOSIS — R740 Nonspecific elevation of levels of transaminase and lactic acid dehydrogenase [LDH]: Secondary | ICD-10-CM | POA: Insufficient documentation

## 2018-03-02 DIAGNOSIS — R945 Abnormal results of liver function studies: Secondary | ICD-10-CM | POA: Insufficient documentation

## 2018-03-02 DIAGNOSIS — C7951 Secondary malignant neoplasm of bone: Secondary | ICD-10-CM

## 2018-03-02 DIAGNOSIS — Z7901 Long term (current) use of anticoagulants: Secondary | ICD-10-CM

## 2018-03-02 DIAGNOSIS — I10 Essential (primary) hypertension: Secondary | ICD-10-CM

## 2018-03-02 DIAGNOSIS — Z85850 Personal history of malignant neoplasm of thyroid: Secondary | ICD-10-CM

## 2018-03-02 DIAGNOSIS — R600 Localized edema: Secondary | ICD-10-CM

## 2018-03-02 DIAGNOSIS — Z79899 Other long term (current) drug therapy: Secondary | ICD-10-CM

## 2018-03-02 DIAGNOSIS — E119 Type 2 diabetes mellitus without complications: Secondary | ICD-10-CM | POA: Insufficient documentation

## 2018-03-02 DIAGNOSIS — R1032 Left lower quadrant pain: Secondary | ICD-10-CM | POA: Insufficient documentation

## 2018-03-02 DIAGNOSIS — D709 Neutropenia, unspecified: Secondary | ICD-10-CM | POA: Insufficient documentation

## 2018-03-02 DIAGNOSIS — R888 Abnormal findings in other body fluids and substances: Secondary | ICD-10-CM | POA: Diagnosis not present

## 2018-03-02 DIAGNOSIS — R188 Other ascites: Secondary | ICD-10-CM | POA: Insufficient documentation

## 2018-03-02 DIAGNOSIS — R7401 Elevation of levels of liver transaminase levels: Secondary | ICD-10-CM

## 2018-03-02 DIAGNOSIS — C778 Secondary and unspecified malignant neoplasm of lymph nodes of multiple regions: Secondary | ICD-10-CM | POA: Diagnosis not present

## 2018-03-02 DIAGNOSIS — C73 Malignant neoplasm of thyroid gland: Secondary | ICD-10-CM

## 2018-03-02 DIAGNOSIS — E039 Hypothyroidism, unspecified: Secondary | ICD-10-CM | POA: Insufficient documentation

## 2018-03-02 LAB — CMP (CANCER CENTER ONLY)
ALT: 309 U/L (ref 0–44)
AST: 639 U/L (ref 15–41)
Albumin: 2.6 g/dL — ABNORMAL LOW (ref 3.5–5.0)
Alkaline Phosphatase: 644 U/L — ABNORMAL HIGH (ref 38–126)
Anion gap: 9 (ref 5–15)
BUN: 17 mg/dL (ref 6–20)
CO2: 31 mmol/L (ref 22–32)
CREATININE: 1.46 mg/dL — AB (ref 0.44–1.00)
Calcium: 8.3 mg/dL — ABNORMAL LOW (ref 8.9–10.3)
Chloride: 102 mmol/L (ref 98–111)
GFR, EST AFRICAN AMERICAN: 48 mL/min — AB (ref >60)
GFR, EST NON AFRICAN AMERICAN: 41 mL/min — AB (ref >60)
Glucose, Bld: 86 mg/dL (ref 70–99)
Potassium: 4.1 mmol/L (ref 3.5–5.1)
Sodium: 142 mmol/L (ref 135–145)
Total Bilirubin: 0.9 mg/dL (ref 0.3–1.2)
Total Protein: 6.5 g/dL (ref 6.5–8.1)

## 2018-03-02 LAB — LIPASE, BLOOD: Lipase: 20 U/L (ref 11–51)

## 2018-03-02 LAB — CBC WITH DIFFERENTIAL/PLATELET
Abs Immature Granulocytes: 0.02 10*3/uL (ref 0.00–0.07)
Basophils Absolute: 0 10*3/uL (ref 0.0–0.1)
Basophils Relative: 0 %
Eosinophils Absolute: 0.1 10*3/uL (ref 0.0–0.5)
Eosinophils Relative: 6 %
HCT: 35.2 % — ABNORMAL LOW (ref 36.0–46.0)
HEMOGLOBIN: 11.1 g/dL — AB (ref 12.0–15.0)
Immature Granulocytes: 1 %
Lymphocytes Relative: 22 %
Lymphs Abs: 0.6 10*3/uL — ABNORMAL LOW (ref 0.7–4.0)
MCH: 28.8 pg (ref 26.0–34.0)
MCHC: 31.5 g/dL (ref 30.0–36.0)
MCV: 91.4 fL (ref 80.0–100.0)
Monocytes Absolute: 0.2 10*3/uL (ref 0.1–1.0)
Monocytes Relative: 9 %
NRBC: 0 % (ref 0.0–0.2)
Neutro Abs: 1.6 10*3/uL — ABNORMAL LOW (ref 1.7–7.7)
Neutrophils Relative %: 62 %
Platelets: 161 10*3/uL (ref 150–400)
RBC: 3.85 MIL/uL — ABNORMAL LOW (ref 3.87–5.11)
RDW: 17.2 % — ABNORMAL HIGH (ref 11.5–15.5)
WBC: 2.5 10*3/uL — ABNORMAL LOW (ref 4.0–10.5)

## 2018-03-02 LAB — TSH: TSH: 8.188 u[IU]/mL — ABNORMAL HIGH (ref 0.308–3.960)

## 2018-03-02 MED ORDER — IOPAMIDOL (ISOVUE-300) INJECTION 61%
INTRAVENOUS | Status: AC
  Filled 2018-03-02: qty 100

## 2018-03-02 MED ORDER — SODIUM CHLORIDE (PF) 0.9 % IJ SOLN
INTRAMUSCULAR | Status: AC
  Filled 2018-03-02: qty 50

## 2018-03-02 MED ORDER — IOPAMIDOL (ISOVUE-300) INJECTION 61%
100.0000 mL | Freq: Once | INTRAVENOUS | Status: AC | PRN
  Administered 2018-03-02: 100 mL via INTRAVENOUS

## 2018-03-02 NOTE — Progress Notes (Signed)
Per Dr. Grier Mitts direction - contacted ED to arrange for patient to be evaluated. Gave charge nurse information about patient and asked for bed, re: elevated liver enzymes and MD direction for patient to be seen as soon as possible for evaluation.  Charge nurse stated to bring patient to ED waiting room, check in with registration and patient will be given bed as soon as available. Patient taken to ED in wheelchair, accompanied by mother and sister. Introduced to Management consultant. Information from rehab facility given to registration staff to be given to clinical staff.

## 2018-03-02 NOTE — Telephone Encounter (Signed)
Called [facility] regarding 12/10

## 2018-03-02 NOTE — ED Notes (Signed)
[facility] contacted for transport back to [facility] in [place]

## 2018-03-02 NOTE — Progress Notes (Signed)
Sarah [name] Kitchen    HEMATOLOGY/ONCOLOGY CLINIC NOTE  Date of Service: ..03/02/2018   Patient Care Team: [name], MD as PCP - General (Family Medicine) [name], MD as Consulting Physician (Physical Medicine and Rehabilitation) Ashok Pall, MD as Consulting Physician (Neurosurgery) Brunetta Genera, MD as Consulting Physician (Hematology) Jacelyn Pi, MD as Consulting Physician (Endocrinology)   Dayton Bailiff MD ([facility] [place]) [phone]. 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  01/09/2014- patient underwent total thyroidectomy by Dr. Elie Goody. Pathology showed Hurthle cell carcinoma with no perineural invasion, presence of lymphovascular invasion and extra thyroidal extension. p T3, p N0, Mx  Received 30 grays in 10 fractions to her C7-T6 lesion finished in November 2015  Received 135 mCi of iodine 131 on 07/21/2014. Whole-body scan was apparently negative.  07/05/2015-07/10/2015 admitted to [facility] for leg weakness. MRI of the T-spine showed recurrent tumor causing spinal cord compression. Patient was emergently taken to the or and underwent a redo thoracic laminectomy of T3 and partial tumor resection.   08/07/2015 PET/CT scan of the chest abdomen pelvis showed slight enlargement of FDG avid left level III and level IV lymph nodes concerning for worsening nodal metastases. Slight enlargement of the hypermetabolic right superior mediastinal mass and multiple metabolically active pulmonary nodules representing worsening metastases. T3 vertebral body and bilateral pedicle metastases  status post posture metallic fusion. No residual uptake in the thyroidectomy bed  Thyroglobulin level was 11.7 (thyroglobulin antibody present 9.8)   HISTORY OF PRESENTING ILLNESS:   Sarah [name] is a wonderful 52 y.o. female who has been referred to [place] by Dr Leamon Arnt (medical oncology at [facility]) for help with local comanagement of the patient's metastatic Hurthle cell thyroid carcinoma with details as noted above. Patient had recent hospitalization from 4/13/7 to 07/10/2015 at [facility] for lower extremity weakness and was noted to have cord compression from recurrent tumor at T3. She is status post redo thoracic laminectomy of T3 and partial tumor resection to try to decompress the core. Pathology was consistent with metastatic Hurthle cell carcinoma.  She has been on dexamethasone 2 mg by mouth twice a day. Continue to have lower extremity weakness and is currently in a skilled nursing facility getting rehabilitation. She feels her lower extremity weakness is possibly getting somewhat better.  Patient had a repeat PET/CT scan at [facility] on 08/07/2015 as noted above which shows progression of her metastatic disease including nodal metastases , pulmonary nodules and right superior mediastinal mass.  MRI of the spine on 08/14/2015 showed improved but persistent cord compression at T3 level.  She was started by Dr. Leamon Arnt on Lenvatinib and 14 mg by mouth daily for progressive metastatic disease after ruling out the option of repeat surgery with Dr. Cyndy Freeze and reradiation with Dr. Ledon Snare.  After a week or so the patient was increased to a dose of 80m po daily. She notes no diarrhea or other gastrointestinal symptoms. No skin rashes.  Is noted to have oral thrush and was started on nystatin swish and swallow.   Patient has a history of pulmonary  embolism 2015 and has continued to be on Xarelto for anticoagulation.   INTERVAL HISTORY  Sarah [name] returns today  for a follow up regarding her Metastatic Hurthle cell thyroid carcinoma. The patient's last visit with [place] was on 01/07/18. She is accompanied today by her sister. The pt reports that she is doing well overall.   In the interim, the patient had a cholecystectomy on 01/25/18. The patient was then admitted for community acquired pneumonia on 02/03/18.   The patient's sister notes that there was significant confusion that precipitated the CT Head which was completed on 02/13/18, as noted below. She notes some concern that Baclofen is causing some of her confusion.   The pt reports that her breathing has improved since her last discharge. She notes that she has not had much nausea as well, and notes that she has been eating much better. She does note that her abdomen has been a little sore recently, but she is not sure if this is because of her abdominal exercises with PT. The pt notes that she has been moving her bowels well, and denies any blood in the stools or black stools.   The pt notes that she was taking 66m Tylenol about 5 times a day recently for a few days.   Of note since the patient's last visit, pt has had a CT C/A/P completed on 02/15/18 with results revealing No evidence of active bleeding in the chest, abdomen, or pelvis. No retroperitoneal bleed. 2. Moderate bilateral pleural effusions. Multifocal ground-glass opacities throughout both lungs may be pulmonary edema or pneumonia. Mild septal thickening suggests an element of pulmonary edema. 3. Biapical consolidations with bronchiectasis may represent postradiation change, recommend correlation with treatment history. 4. 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 5. Recent cholecystectomy with small amount of residual complex fluid in the operative bed. This may represent postoperative seroma, there is no peripheral enhancement to suggest superimposed infection. Biloma is not  entirely excluded, however lack of significant increase compared to CT 10 days ago argues against this, and there is no other perihepatic fluid. 6. Small amount of free fluid in the pelvis, slightly diminished from prior exam. 7. Confluent edema in the body wall/flanks, similar to prior.  The pt also had a 02/13/18 CT Head which revealed No acute intracranial process. 2. 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 Recommend MRI of the brain with and without contrast. 3. Borderline parenchymal brain volume loss for age.  Lab results today (03/02/18) of CBC w/diff and CMP is as follows: all values are WNL except for WBC at 2.5k, RBC at 3.85, HGB at 11.1, HCT at 35.2, RDW at 17.2, ANC at 1.6k, Lymphs abs at 600, Creatinine at 1.46, Calcium at 8.3, Albumin at 2.6, AST at 639, ALT at 309, Alk Phos at 644, GFR at 48. 03/02/18 Thyroglobulin Level by RIA is pending 03/02/18 Thyroglobulin antibody is pending 03/02/18 TSH is at 8.188  On review of systems, pt reports eating better, moving her bowels well, some abdominal soreness, and denies fevers, chills, over abdominal pain, blood in the stools, black stools, leg swelling, and any other symptoms.   MEDICAL HISTORY:  Past Medical History:  Diagnosis Date  . Cancer ([facility]    FNA of thyroid positive for onconytic/hurthle cell carcinoma  . Chronic back pain   . Complication of anesthesia    Nausea and vomiting  . DDD (degenerative disc disease), cervical   .  DJD (degenerative joint disease)   . Family history of adverse reaction to anesthesia    MOTHER HAS NAUSEA   . GERD (gastroesophageal reflux disease)   . History of rectal fissure   . HIT (heparin-induced thrombocytopenia) ([place])   . Hypertension   . Hypothyroidism   . Obesity   . Paraplegia at T4 level ([place])   . Pulmonary embolus, right ([place]) 2015  . Rotator cuff tendonitis right    SURGICAL HISTORY: Past Surgical History:  Procedure Laterality Date  .  CHOLECYSTECTOMY N/A 01/25/2018   Procedure: LAPAROSCOPIC CHOLECYSTECTOMY;  Surgeon: Virl Cagey, MD;  Location: [facility];  Service: General;  Laterality: N/A;  . FEMUR IM NAIL Left 09/11/2017   Procedure: INTRAMEDULLARY (IM) RETROGRADE FEMORAL NAILING;  Surgeon: Altamese McSwain, MD;  Location: [place];  Service: Orthopedics;  Laterality: Left;  . LAMINECTOMY N/A 12/14/2013   Procedure: THORACIC LAMINECTOMY FOR TUMOR THORACIC THREE;  Surgeon: Ashok Pall, MD;  Location: [place] [facility];  Service: Neurosurgery;  Laterality: N/A;  THORACIC LAMINECTOMY FOR TUMOR THORACIC THREE  . LAMINECTOMY N/A 07/05/2015   Procedure: THORACIC LAMINECTOMY FOR TUMOR;  Surgeon: Ashok Pall, MD;  Location: [place] [facility];  Service: Neurosurgery;  Laterality: N/A;  . LAMINECTOMY N/A 09/03/2017   Procedure: POSTERIOR SPINAL TUMOR RESECTION THORACIC THREE;  Surgeon: Ashok Pall, MD;  Location: [place];  Service: Neurosurgery;  Laterality: N/A;  POSTERIOR SPINAL TUMOR RESECTION THORACIC THREE  . POSTERIOR LUMBAR FUSION 4 LEVEL N/A 12/30/2013   Procedure: Thoracic one-Thoracic five posterior thoracic fusion with pedicle screws;  Surgeon: Ashok Pall, MD;  Location: [place] [facility];  Service: Neurosurgery;  Laterality: N/A;  Thoracic one-Thoracic five posterior thoracic fusion with pedicle screws  . THYROIDECTOMY N/A 01/09/2014   Procedure: TOTAL THYROIDECTOMY;  Surgeon: Izora Gala, MD;  Location: [place];  Service: ENT;  Laterality: N/A;  . TONSILLECTOMY      SOCIAL HISTORY: Social History   Socioeconomic History  . Marital status: Single    Spouse name: Not on file  . Number of children: Not on file  . Years of education: Not on file  . Highest education level: Not on file  Occupational History  . Not on file  Social Needs  . Financial resource strain: Not on file  . Food insecurity:    Worry: Not on file    Inability: Not on file  . Transportation needs:    Medical: Not on file    Non-medical: Not on file  Tobacco  Use  . Smoking status: Never Smoker  . Smokeless tobacco: Never Used  Substance and Sexual Activity  . Alcohol use: No  . Drug use: No  . Sexual activity: Not Currently  Lifestyle  . Physical activity:    Days per week: Not on file    Minutes per session: Not on file  . Stress: Not on file  Relationships  . Social connections:    Talks on phone: Not on file    Gets together: Not on file    Attends religious service: Not on file    Active member of club or organization: Not on file    Attends meetings of clubs or organizations: Not on file    Relationship status: Not on file  . Intimate partner violence:    Fear of current or ex partner: No    Emotionally abused: No    Physically abused: No    Forced sexual activity: No  Other Topics Concern  . Not on file  Social History Narrative  . Not on file    FAMILY HISTORY: Family History  Problem Relation Age of Onset  . Hypertension Mother   . Diabetes Mother   . Hypertension Father   . Stroke Father   . Diabetes Father   . Diabetes Sister   . Diabetes Sister     ALLERGIES:  is allergic to bee venom; keflex [cephalexin]; tramadol; gabapentin; heparin; hydrochlorothiazide; hydrocodone; oxycodone-acetaminophen; sorbitol; and tizanidine.  MEDICATIONS:  Current Outpatient Medications  Medication Sig Dispense Refill  . acetaminophen (TYLENOL) 325 MG tablet Take 2 tablets (650 mg total) by mouth every 4 (four) hours as needed for mild pain ((score 1 to 3) or temp > 100.5).    Sarah [name] Kitchen albuterol (PROVENTIL) (2.5 MG/3ML) 0.083% nebulizer solution Take 3 mLs (2.5 mg total) by nebulization every 6 (six) hours as needed for wheezing or shortness of breath. 75 mL 12  . Amino Acids-Protein Hydrolys (FEEDING SUPPLEMENT, PRO-STAT SUGAR FREE 64,) LIQD Take 30 mLs by mouth 3 (three) times daily between meals. 900 mL 0  . baclofen (LIORESAL) 10 MG tablet Take 25 mg by mouth 4 (four) times daily as needed for muscle spasms.     Sarah [name] Kitchen dextromethorphan  (DELSYM) 30 MG/5ML liquid Take 5 mLs (30 mg total) by mouth 2 (two) times daily as needed for cough. 89 mL 0  . famotidine (PEPCID) 20 MG tablet Take 1 tablet (20 mg total) by mouth 2 (two) times daily. 60 tablet 0  . feeding supplement, ENSURE ENLIVE, (ENSURE ENLIVE) LIQD Take 237 mLs by mouth 3 (three) times daily between meals. 237 mL 12  . furosemide (LASIX) 40 MG tablet Take 1 tablet (40 mg total) by mouth every other day. 30 tablet   . ipratropium-albuterol (DUONEB) 0.5-2.5 (3) MG/3ML SOLN Take 3 mLs by nebulization 3 (three) times daily. 360 mL   . levothyroxine (SYNTHROID, LEVOTHROID) 150 MCG tablet Take 150 mcg by mouth daily before breakfast.     . loratadine (CLARITIN) 10 MG tablet Take 1 tablet (10 mg total) by mouth daily.    . Multiple Vitamin (MULTIVITAMIN WITH MINERALS) TABS tablet Take 1 tablet by mouth daily.    . ondansetron (ZOFRAN) 4 MG tablet Take 1 tablet by mouth every 6 (six) hours as needed for nausea or vomiting.     Sarah [name] Kitchen oxyCODONE (ROXICODONE) 5 MG immediate release tablet Take 1 tablet (5 mg total) by mouth every 6 (six) hours as needed for severe pain. 12 tablet 0  . pantoprazole (PROTONIX) 40 MG tablet Take 1 tablet by mouth daily.    . polyethylene glycol (MIRALAX / GLYCOLAX) packet Take 17 g by mouth daily. 14 each 0  . potassium chloride SA (K-DUR,KLOR-CON) 20 MEQ tablet Take 2 tablets (40 mEq total) by mouth every other day.    . predniSONE (DELTASONE) 5 MG tablet Take 1 daily with breakfast x 5 days, then 0.5 tab daily x 5 days, then stop 5 tablet 0  . rivaroxaban (XARELTO) 20 MG TABS tablet Take 20 mg by mouth daily with supper.    . senna (SENOKOT) 8.6 MG TABS tablet Take 1 tablet (8.6 mg total) by mouth at bedtime as needed for mild constipation. 120 each 0  . sertraline (ZOLOFT) 50 MG tablet      No current facility-administered medications for this visit.     REVIEW OF SYSTEMS:    A 10+ POINT REVIEW OF SYSTEMS WAS OBTAINED including neurology, dermatology,  psychiatry, cardiac, respiratory, lymph, extremities, GI, GU, Musculoskeletal, constitutional,  breasts, reproductive, HEENT.  All pertinent positives are noted in the HPI.  All others are negative.   PHYSICAL EXAMINATION:  ECOG PERFORMANCE STATUS: 3 - Symptomatic, >50% confined to bed  Vitals:   03/02/18 1204  BP: 121/86  Pulse: 89  Resp: 18  Temp: 98.3 F (36.8 C)  SpO2: 100%   Filed Weights   03/02/18 1204  Weight: 214 lb (97.1 kg)   .Body mass index is 29.85 kg/m.   GENERAL:alert, in no acute distress and comfortable SKIN: no acute rashes, no significant lesions EYES: conjunctiva are pink and non-injected, sclera anicteric OROPHARYNX: MMM, no exudates, no oropharyngeal erythema or ulceration NECK: supple, no JVD LYMPH:  no palpable lymphadenopathy in the cervical, axillary or inguinal regions LUNGS: clear to auscultation b/l with normal respiratory effort HEART: regular rate & rhythm ABDOMEN:  normoactive bowel sounds , non tender, not distended. No palpable hepatosplenomegaly.  Extremity: 1+ pedal edema bilaterally  PSYCH: alert & oriented x 3 with fluent speech NEURO: no focal motor/sensory deficits   LABORATORY DATA:  I have reviewed the data as listed  . CBC Latest Ref Rng & Units 03/02/2018 02/21/2018 02/18/2018  WBC 4.0 - 10.5 K/uL 2.5(L) 3.8(L) 6.4  Hemoglobin 12.0 - 15.0 g/dL 11.1(L) 9.9(L) 11.0(L)  Hematocrit 36.0 - 46.0 % 35.2(L) 32.2(L) 36.1  Platelets 150 - 400 K/uL 161 127(L) 102(L)   ANC 2.3k . CMP Latest Ref Rng & Units 03/02/2018 02/22/2018 02/21/2018  Glucose 70 - 99 mg/dL 86 - 96  BUN 6 - 20 mg/dL 17 - 24(H)  Creatinine 0.44 - 1.00 mg/dL 1.46(H) 1.02(H) 0.95  Sodium 135 - 145 mmol/L 142 - 141  Potassium 3.5 - 5.1 mmol/L 4.1 - 3.5  Chloride 98 - 111 mmol/L 102 - 104  CO2 22 - 32 mmol/L 31 - 32  Calcium 8.9 - 10.3 mg/dL 8.3(L) - 7.6(L)  Total Protein 6.5 - 8.1 g/dL 6.5 - -  Total Bilirubin 0.3 - 1.2 mg/dL 0.9 - -  Alkaline Phos 38 - 126 U/L  644(H) - -  AST 15 - 41 U/L 639(HH) - -  ALT 0 - 44 U/L 309(HH) - -       RADIOGRAPHIC STUDIES: I have personally reviewed the radiological images as listed and agreed with the findings in the report.  .Ct Abdomen Pelvis Wo Contrast  Result Date: 02/05/2018 CLINICAL DATA:  51 year old female with abdominal pain. Anemia and decreased blood pressures. Query retroperitoneal bleed. Community-acquired pneumonia. Recent laparoscopic cholecystectomy. Metastatic thyroid carcinoma, and paraplegia at the T4 level. EXAM: CT ABDOMEN AND PELVIS WITHOUT CONTRAST TECHNIQUE: Multidetector CT imaging of the abdomen and pelvis was performed following the standard protocol without IV contrast. COMPARISON:  Preoperative CT Abdomen and Pelvis [facility] 12/13/2017. FINDINGS: Lower chest: Small bilateral layering pleural effusions, stable on the left since September but increased on the right. Confluent posterior basal segment left lower lobe consolidation with air bronchograms is new since September. Widespread additional bilateral lower lobe patchy and confluent peribronchial opacity. There is also right middle lobe involvement (series 4, image 3). No pericardial effusion. Hepatobiliary: Appearance of recent cholecystectomy with small volume postoperative gas and fluid in the gallbladder bed. No other perihepatic fluid and otherwise negative noncontrast appearance of the liver. Pancreas: Negative. Spleen: Negative. Adrenals/Urinary Tract: Normal adrenal glands. Stable and negative noncontrast kidneys. No retroperitoneal hemorrhage in the abdomen or pelvis. Decompressed ureters. Unremarkable urinary bladder. Stomach/Bowel: Abundant rectosigmoid retained stool, increased since September. Chronic circumferential rectal wall thickening. Highly redundant sigmoid colon.  The proximal sigmoid and descending are fairly decompressed. Mild retained stool in the transverse colon similar to the prior study. Negative right  colon and appendix. The cecum is on a lax mesentery located anteriorly near the midline. Negative terminal ileum. No dilated small bowel. Decompressed stomach and duodenum. No free air. Vascular/Lymphatic: Vascular patency is not evaluated in the absence of IV contrast. No lymphadenopathy. Reproductive: Negative noncontrast appearance aside from the adjacent free fluid. Other: Mild presacral stranding is stable since September. New small volume simple appearing free fluid in the anterior pelvis anterior to the rectum (series 2, image 77). Musculoskeletal: Stable visualized osseous structures. Advanced degenerative changes in the lumbar spine. Previous proximal left femur ORIF. There is bilateral body wall edema which is increased compared to September. IMPRESSION: 1. Negative for retroperitoneal hemorrhage. No hematoma identified in the abdomen or pelvis. 2. Bilateral multilobar pneumonia, with dense left lower lobe consolidation. Increased right and stable left small pleural effusions. 3. Satisfactory appearance of recent post cholecystectomy changes. 4. Small volume of simple appearing fluid in the pelvis is nonspecific but may be related to #3. 5. Increased rectosigmoid stool since September raising the possibility of fecal impaction. Electronically Signed   By: Genevie Ann M.D.   On: 02/05/2018 17:44   Dg Chest 2 View  Result Date: 02/03/2018 CLINICAL DATA:  Cough. EXAM: CHEST - 2 VIEW COMPARISON:  December 13, 2017 FINDINGS: There is a masslike opacity in the right upper lung, not seen in September 2019. There is infiltrate in the left retrocardiac region as well. No other interval changes or acute abnormalities. IMPRESSION: 1. The masslike opacity in the right upper lung is most likely infectious given the rapid development since December 13, 2017. There is also infiltrate in the left base, likely pneumonia. Recommend short-term follow-up after treatment to ensure resolution. Electronically Signed   By:  Dorise Bullion III M.D   On: 02/03/2018 22:03   Ct Head W & Wo Contrast  Result Date: 02/13/2018 CLINICAL DATA:  Altered level of consciousness for a few weeks. History of metastatic thyroid cancer. EXAM: CT HEAD WITHOUT AND WITH CONTRAST TECHNIQUE: Contiguous axial images were obtained from the base of the skull through the vertex without and with intravenous contrast CONTRAST:  56m OMNIPAQUE IOHEXOL 300 MG/ML  SOLN COMPARISON:  CT HEAD September 02, 2017 FINDINGS: BRAIN: No intraparenchymal hemorrhage, mass effect nor midline shift. Borderline parenchymal brain volume loss for age. No hydrocephalus. No acute large vascular territory infarcts. No abnormal extra-axial fluid collections. Basal cisterns are patent. VASCULAR: Mild calcific atherosclerosis carotid siphon. SKULL/SOFT TISSUES: No skull fracture. 19 mm more conspicuous lytic lesion LEFT parietal calvarium (diploic space to inner table) with slight underlying dural enhancement. Small RIGHT frontal scalp lipoma. No significant soft tissue swelling. ORBITS/SINUSES: The included ocular globes and orbital contents are normal.Mild paranasal sinus mucosal thickening. Trace RIGHT mastoid effusion. OTHER: None. IMPRESSION: 1. No acute intracranial process. 2. 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 Recommend MRI of the brain with and without contrast. 3. Borderline parenchymal brain volume loss for age. 4. These results will be called to the ordering clinician or representative by the professional radiologist assistant, and communication documented in [employer] Dashboard. Electronically Signed   By: CElon AlasM.D.   On: 02/13/2018 18:13   Ct Chest W Contrast  Result Date: 02/15/2018 CLINICAL DATA:  Acute blood-loss anemia. Concern for retroperitoneal bleed. Metastatic thyroid cancer. Abdominal distension. EXAM: CT CHEST, ABDOMEN AND PELVIS WITHOUT CONTRAST TECHNIQUE: Multidetector  CT imaging of the chest, abdomen and  pelvis was performed following the standard protocol without IV contrast. COMPARISON:  Abdominopelvic CT 10 days ago 02/05/2018. Thoracic spine CT 10/26/2017 PET CT 05/20/2017 FINDINGS: CT CHEST FINDINGS Cardiovascular: Right upper extremity PICC with tip in the distal SVC. No central pulmonary embolus. Thoracic aorta is normal in caliber. No evidence of dissection. Normal heart size. No pericardial effusion. Mediastinum/Nodes: Supraclavicular regions partially obscured by patient's arms. No enlarged mediastinal or hilar lymph nodes. Thyroid gland is not well visualized and is likely surgically absent. The esophagus is decompressed. Lungs/Pleura: Moderate bilateral pleural effusions, slightly increased in size from included portion of abdominal CT 02/05/2018. Fluid measures simple density without evidence of hemothorax. There are multifocal patchy opacities throughout all lobes of both lungs. Septal thickening in the lower lobes. Biapical consolidations with bronchiectasis. Retained mucus or debris in the dependent trachea and right mainstem bronchus. Pulmonary nodules on prior PET are obscured by ground-glass opacities and apical consolidations. Musculoskeletal: Known T3 metastatic disease with posterior fusion T1 through T5, better assessed on prior thoracic myelography. Lesion involves the posterior aspect of adjacent T2 and T4 vertebral bodies with severe T3 compression fracture as before. Spinal canal involvement not well assessed on the current exam. No new compression fracture. CT ABDOMEN PELVIS FINDINGS Hepatobiliary: No focal hepatic lesion. Postcholecystectomy with clips in the gallbladder fossa. Persistent fluid in the operative bed with slightly complex fluid but resolution of air. No peripheral enhancement. No other perihepatic fluid. No biliary dilatation. Pancreas: Negative.  No ductal dilatation or inflammation. Spleen: Normal in size without focal abnormality. No perisplenic fluid. Adrenals/Urinary  Tract: Normal adrenal glands. No hydronephrosis or perinephric edema. Homogeneous renal enhancement with symmetric excretion on delayed phase imaging. Urinary bladder is physiologically distended without wall thickening. Stomach/Bowel: Slight rectal wall thickening. No other bowel wall thickening, inflammatory change or obstruction. Enteric contrast reaches the colon. Sigmoid colonic redundancy as before. Stomach is physiologically distended. Normal appendix. No mesenteric hematoma. Vascular/Lymphatic: No retroperitoneal stranding or hemorrhage. Abdominal aorta and IVC are intact. No enlarged abdominal or pelvic lymph nodes. Reproductive: Status post hysterectomy. No adnexal masses. Small amount of free fluid in the pelvis, slightly diminished from prior. Other: Small amount of free fluid in the pelvis measures simple fluid density. No evidence of hemoperitoneum. Subcutaneous edema in the abdominal wall/flanks. No free air. Musculoskeletal: Multilevel degenerative change in the lumbar spine. Left proximal femur hardware is partially included. No obvious osseous lesion. Spinal canal a soft tissue density at L1-L2 on prior myelography is not well seen currently. IMPRESSION: 1. No evidence of active bleeding in the chest, abdomen, or pelvis. No retroperitoneal bleed. 2. Moderate bilateral pleural effusions. Multifocal ground-glass opacities throughout both lungs may be pulmonary edema or pneumonia. Mild septal thickening suggests an element of pulmonary edema. 3. Biapical consolidations with bronchiectasis may represent postradiation change, recommend correlation with treatment history. 4. 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 5. Recent cholecystectomy with small amount of residual complex fluid in the operative bed. This may represent postoperative seroma, there is no peripheral enhancement to suggest superimposed infection. Biloma is not  entirely excluded, however lack of significant increase compared to CT 10 days ago argues against this, and there is no other perihepatic fluid. 6. Small amount of free fluid in the pelvis, slightly diminished from prior exam. 7. Confluent edema in the body wall/flanks, similar to prior. Electronically Signed   By: Keith Rake M.D.   On:  02/15/2018 23:31   Ct Abdomen Pelvis W Contrast  Result Date: 02/15/2018 CLINICAL DATA:  Acute blood-loss anemia. Concern for retroperitoneal bleed. Metastatic thyroid cancer. Abdominal distension. EXAM: CT CHEST, ABDOMEN AND PELVIS WITHOUT CONTRAST TECHNIQUE: Multidetector CT imaging of the chest, abdomen and pelvis was performed following the standard protocol without IV contrast. COMPARISON:  Abdominopelvic CT 10 days ago 02/05/2018. Thoracic spine CT 10/26/2017 PET CT 05/20/2017 FINDINGS: CT CHEST FINDINGS Cardiovascular: Right upper extremity PICC with tip in the distal SVC. No central pulmonary embolus. Thoracic aorta is normal in caliber. No evidence of dissection. Normal heart size. No pericardial effusion. Mediastinum/Nodes: Supraclavicular regions partially obscured by patient's arms. No enlarged mediastinal or hilar lymph nodes. Thyroid gland is not well visualized and is likely surgically absent. The esophagus is decompressed. Lungs/Pleura: Moderate bilateral pleural effusions, slightly increased in size from included portion of abdominal CT 02/05/2018. Fluid measures simple density without evidence of hemothorax. There are multifocal patchy opacities throughout all lobes of both lungs. Septal thickening in the lower lobes. Biapical consolidations with bronchiectasis. Retained mucus or debris in the dependent trachea and right mainstem bronchus. Pulmonary nodules on prior PET are obscured by ground-glass opacities and apical consolidations. Musculoskeletal: Known T3 metastatic disease with posterior fusion T1 through T5, better assessed on prior thoracic  myelography. Lesion involves the posterior aspect of adjacent T2 and T4 vertebral bodies with severe T3 compression fracture as before. Spinal canal involvement not well assessed on the current exam. No new compression fracture. CT ABDOMEN PELVIS FINDINGS Hepatobiliary: No focal hepatic lesion. Postcholecystectomy with clips in the gallbladder fossa. Persistent fluid in the operative bed with slightly complex fluid but resolution of air. No peripheral enhancement. No other perihepatic fluid. No biliary dilatation. Pancreas: Negative.  No ductal dilatation or inflammation. Spleen: Normal in size without focal abnormality. No perisplenic fluid. Adrenals/Urinary Tract: Normal adrenal glands. No hydronephrosis or perinephric edema. Homogeneous renal enhancement with symmetric excretion on delayed phase imaging. Urinary bladder is physiologically distended without wall thickening. Stomach/Bowel: Slight rectal wall thickening. No other bowel wall thickening, inflammatory change or obstruction. Enteric contrast reaches the colon. Sigmoid colonic redundancy as before. Stomach is physiologically distended. Normal appendix. No mesenteric hematoma. Vascular/Lymphatic: No retroperitoneal stranding or hemorrhage. Abdominal aorta and IVC are intact. No enlarged abdominal or pelvic lymph nodes. Reproductive: Status post hysterectomy. No adnexal masses. Small amount of free fluid in the pelvis, slightly diminished from prior. Other: Small amount of free fluid in the pelvis measures simple fluid density. No evidence of hemoperitoneum. Subcutaneous edema in the abdominal wall/flanks. No free air. Musculoskeletal: Multilevel degenerative change in the lumbar spine. Left proximal femur hardware is partially included. No obvious osseous lesion. Spinal canal a soft tissue density at L1-L2 on prior myelography is not well seen currently. IMPRESSION: 1. No evidence of active bleeding in the chest, abdomen, or pelvis. No retroperitoneal  bleed. 2. Moderate bilateral pleural effusions. Multifocal ground-glass opacities throughout both lungs may be pulmonary edema or pneumonia. Mild septal thickening suggests an element of pulmonary edema. 3. Biapical consolidations with bronchiectasis may represent postradiation change, recommend correlation with treatment history. 4. 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 5. Recent cholecystectomy with small amount of residual complex fluid in the operative bed. This may represent postoperative seroma, there is no peripheral enhancement to suggest superimposed infection. Biloma is not entirely excluded, however lack of significant increase compared to CT 10 days ago argues against this, and there is no other perihepatic  fluid. 6. Small amount of free fluid in the pelvis, slightly diminished from prior exam. 7. Confluent edema in the body wall/flanks, similar to prior. Electronically Signed   By: Keith Rake M.D.   On: 02/15/2018 23:31   Dg Chest Port 1 View  Result Date: 02/16/2018 CLINICAL DATA:  Follow-up multifocal pneumonia EXAM: PORTABLE CHEST 1 VIEW COMPARISON:  02/15/2018 FINDINGS: Cardiac shadow remains enlarged. Bilateral pleural effusions are again seen. Biapical consolidative changes are again identified and unchanged. No new focal infiltrate is seen. Postsurgical changes at the cervicothoracic junction are noted. Right-sided PICC line is seen in satisfactory position. IMPRESSION: Overall stable appearance of the chest with biapical consolidation. Electronically Signed   By: Inez Catalina M.D.   On: 02/16/2018 07:49   Dg Chest Port 1 View  Result Date: 02/13/2018 CLINICAL DATA:  Follow-up exam.  Pulmonary edema EXAM: PORTABLE CHEST 1 VIEW COMPARISON:  Chest radiograph 02/12/2018 FINDINGS: Right upper extremity PICC line tip projects over the superior vena cava. Stable cardiac and mediastinal contours. Re-demonstrated  bilateral predominately upper lung airspace consolidation. Mid lower lung patchy consolidation is grossly similar. Probable small left pleural effusion. No pneumothorax. IMPRESSION: Similar-appearing bilateral consolidation, upper lobe predominant, potentially multifocal pneumonia. Electronically Signed   By: Lovey Newcomer M.D.   On: 02/13/2018 09:02   Dg Chest Port 1 View  Result Date: 02/12/2018 CLINICAL DATA:  Shortness of breath.  History of thyroid carcinoma EXAM: PORTABLE CHEST 1 VIEW COMPARISON:  February 05, 2018 FINDINGS: There is extensive airspace opacity in both upper lobes, more on the right than on the left, similar to prior study. There is underlying interstitial and patchy alveolar edema elsewhere, increased from most recent prior study. There is cardiomegaly with pulmonary venous hypertension. No adenopathy. Central catheter tip is at the cavoatrial junction. Postoperative changes are noted in the upper thoracic region, stable. IMPRESSION: Increase in interstitial and alveolar opacity, consistent with either progression of widespread pneumonia or congestive heart failure. Both entities may be present concurrently. Upper lobe consolidation, more on the right than on the left, persists. Cardiac silhouette is stable. Central catheter tip at cavoatrial junction. No pneumothorax. Electronically Signed   By: Lowella Grip III M.D.   On: 02/12/2018 07:41   Dg Chest Port 1 View  Result Date: 02/05/2018 CLINICAL DATA:  Pneumonia.  History of thyroid carcinoma EXAM: PORTABLE CHEST 1 VIEW COMPARISON:  February 03, 2018 and December 13, 2017 FINDINGS: There is increase in consolidation in the right upper lobe. There is new airspace consolidation in the left upper lobe. There is also patchy airspace consolidation in the right base, new. There is stable consolidation in the medial left base. Heart is upper normal in size with pulmonary vascularity normal. No appreciable adenopathy. Postoperative  changes noted in the upper thoracic region. Trachea is midline. IMPRESSION: Multifocal pneumonia with progression compared to 2 days prior. Stable cardiac silhouette. Electronically Signed   By: Lowella Grip III M.D.   On: 02/05/2018 07:02     ASSESSMENT & PLAN:   52 y.o. 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  Patient has had treatment as noted above. h/o cord compression at T3 level due to recurrent tumor status post partial resection and debulking.   MRI on 08/14/2015 showed improved but persistent cord compression at the T3 level. She has been noted not to be a candidate for further repeat surgery at this time  or repeat radiation. Recent PET/CT scan is noted of bowel done at [facility] on 08/07/2015 showed evidence of tumor progression and patient was started on lenvatinib as per Dr. Leamon Arnt. Her thyroglobulin level was 11.7 with anti-body of 9.8 on 08/07/2015 at [facility]  PET/CT 11/01/2015: shows relatively stable though high burden of disease. Some concern of local recurrence in the thyroidectomy bed which is asymptomatic at this time. MRI thoracic spine 12/25/2015: No new abnormality since the prior examination. Status post T1-5 fusion for T3 decompression. Mass effect on the cord at the T3 level appears improved compared to the prior exam. Postoperative fluid collection likely representing a seroma seen on the prior study has slightly decreased in size.  PET/CT 06/03/2016 with Generally reduced hypermetabolic activity in the previously identified lesions, including the pulmonary nodules, dominant upper thoracic metastatic focus, and left lower cervical adenopathy. Mildly reduced size of previous lesions as well. No new lesions.  PET/CT 12/12/2016: Concern for progression of metastatic Hurthle cell thyroid carcinoma with significant increase in metabolic activity of several lymph nodes,  pulmonary nodules, and skeletal metastasis. Most convincing element of progression is a significant increase in metabolic activity (more than doubling). Mild increase in size of the lymph nodes.  PET/CT  7/98/92: Partial metabolic response. Left supraclavicular and right paratracheal nodal metastases, bilateral pulmonary metastases, and osseous metastases involving the upper thoracic spine and right sternum, with mild decrease in metabolism, as described above.   10/26/17 CT Thoracic Spine revealed Severe spinal stenosis at T3 from recurrent epidural tumor with complete effacement of the thecal sac and partial myelographic block. 2. Epidural tumor extends superiorly to T1 and inferiorly to T5 with milder spinal stenosis. 3. New nodular soft tissue dorsally in the canal at L1 and L2 which results in mild spinal stenosis and presumably reflects tumor as well, though it is unclear if this is completely extradural or partially intradural as well. 4. Unchanged appearance of T1-T5 posterior fusion, severe T3 pathologic fracture, and T2-T4 osseous tumor compared to the recent noncontrast CT.   Pt completed 50Gy in 5 fractions from 11/05/17 to 11/24/17 with Dr. [name]   PLAN:  -Discussed pt labwork today, 03/02/18; liver functions increased with AST to 639, ALT to 309, Alk Phos to 644, but Total Bilirubin normal at 0.9. Mild neutropenia with ANC at 1.6k, HGB improved to 11.1, PLT normal at 161k  -Liver functions were all normal on 02/16/18 -Concerned for blockage in liver considering the recent cholecystectomy or a post-operative infection vs Tylenol medication effect  -Recommended that the pt be admitted today given the concerning findings of her recently elevated liver functions in a post-surgical setting  -US Liver or CT A/P to evaluate liver function elevation further and would recommend GI consultation. -Proceed with PET/CT scheduled for 03/08/18 -Discussed the 02/15/18 CT C/A/P which revealed No  evidence of active bleeding in the chest, abdomen, or pelvis. No retroperitoneal bleed. 2. Moderate bilateral pleural effusions. Multifocal ground-glass opacities throughout both lungs may be pulmonary edema or pneumonia. Mild septal thickening suggests an element of pulmonary edema. 3. Biapical consolidations with bronchiectasis may represent postradiation change, recommend correlation with treatment history. 4. 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 5. Recent cholecystectomy with small amount of residual complex fluid in the operative bed. This may represent postoperative seroma, there is no peripheral enhancement to suggest superimposed infection. Biloma is not entirely excluded, however lack of significant increase compared to CT 10 days ago argues against this,  and there is no other perihepatic fluid. 6. Small amount of free fluid in the pelvis, slightly diminished from prior exam. 7. Confluent edema in the body wall/flanks, similar to prior.  -Discussed the 02/13/18 CT Head which revealed No acute intracranial process. 2. 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 Recommend MRI of the brain with and without contrast. 3. Borderline parenchymal brain volume loss for age. -Will suggest radiation of the new parietal calvarial metastasis becomes if this becomes symptomatic, will otherwise continue watchful observation -Will continue to allow pt to recover from surgery and continue rehab before adding back Lenvatinib  -Continue Xarelto indefinitely -Will see the pt back in 4-6 weeks with PET/CT   2) s/p Grade 1 oral mucositis related to Lenvatinib -resolved  3) FDG avidity in the rectum - much improved. No significant concerns on last PET  4) abnormal LFTs likely from gallstones - last HIDA - Patent cystic duct without evidence for acute cholecystitis. 2. Normal gallbladder ejection fraction.  5)  history of right-sided pulmonary embolism on chronic anticoagulation with Rivaroxaban. Monitor for bleeding. -continue Xarelto   ED today for evaluation of acute transaminitis in the setting of recent gall bladder surgery and pneumonia RTC with Dr Irene Limbo in 6 weeks with labs   All of the patients questions were answered with apparent satisfaction. The patient knows to call the clinic with any problems, questions or concerns.   The total time spent in the appt was 30 minutes and more than 50% was on counseling and direct patient cares.     Sullivan Lone MD [place] AAHIVMS [facility] Hematology/Oncology Physician [facility]  (Office):       [phone] (Work cell):  [phone] (Fax):           [phone]  I, Baldwin Jamaica, am acting as a scribe for Dr. Sullivan Lone.   .I have reviewed the above documentation for accuracy and completeness, and I agree with the above. Brunetta Genera MD

## 2018-03-02 NOTE — Discharge Instructions (Addendum)
Please have your liver function tests rechecked this week You will also need to have the fluid collection in your gallbladder fossa reevaluated. Please return if your are worse at any time, especially fever, pain or other signs of worsening. Avoid anything which could harm your liver especially acetaminophen and alcohol

## 2018-03-02 NOTE — ED Notes (Signed)
Pt is resting and appears comfortable with family at bedside.  She is waiting to get CT done.  IV in place.  She denies any complaints.

## 2018-03-02 NOTE — ED Provider Notes (Signed)
Patient signed out to me by Dr. Gilford Raid.  She was seen today in oncology clinic and noted to have elevated liver function tests.  She was sent here for further evaluation.  She has recently had her gallbladder removed but has since been hospitalized with pneumonia.  She denies any pain, nausea, or vomiting.  She reports she has been doing well in her rehab facility. Labs were reviewed and LFTs are elevated with AST 639 and ALT 309.  Lipase is normal at 20.  Bilirubin is normal at 0.9 She is neutropenic with white count of 2500 but ANC is 1550 Patient's abdomen is soft and nontender I was asked to follow-up on CT scan I have reviewed CT scan and discussed results with patient and family Prior pneumonia appears to be improving Right lower lobe effusion has resolved Left lower lobe effusion remains Collection in the gallbladder fossa measures 3.4 x 2.3 cm and is smaller than previous study Remainder of CT does not show acute abnormalities I discussed all results with patient.  I discussed that the fluid in the gallbladder fossa does need to be followed up on as well as the LFTs.  We have discussed avoiding any thing that would exacerbate her LFTs including not using Tylenol or drinking alcohol.  Patient voices understanding of this plan and need for follow-up.   Pattricia Boss, MD 03/02/18 [id number]

## 2018-03-02 NOTE — ED Provider Notes (Addendum)
[facility] DEPT Provider Note   CSN: [id number] Arrival date & time: 03/02/18  1348   History   Chief Complaint Chief Complaint  Patient presents with  . Abnormal Lab    HPI Sarah [name] is a 52 y.o. female.  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  She had a cystectomy on November 4 due to gallstones.  She reports abdominal pain she has his left lower quadrant and epigastric area, it feels very tight and like she will need to have a bowel movement.  Is worse with moving around and better when she lays flat.  He denies any other symptoms such as nausea, vomiting, fevers, chills, diarrhea or constipation.  She ports that she just started being able to pass gas.  She denies any history of hepatitis, denies any history of travel, she has received blood transfusion the past 6 months.     Past Medical History:  Diagnosis Date  . Cancer [facility])    FNA of thyroid positive for onconytic/hurthle cell carcinoma  . Chronic back pain   . Complication of anesthesia    Nausea and vomiting  . DDD (degenerative disc disease), cervical   . DJD (degenerative joint disease)   . Family history of adverse reaction to anesthesia    MOTHER HAS NAUSEA   . GERD (gastroesophageal reflux disease)   . History of rectal fissure   . HIT (heparin-induced thrombocytopenia) ([place])   . Hypertension   . Hypothyroidism   . Obesity   . Paraplegia at T4 level ([place])   . Pulmonary embolus, right ([place]) 2015  . Rotator cuff tendonitis right    Patient Active Problem List   Diagnosis Date Noted  . Dyspnea   . Multifocal pneumonia   . Community acquired pneumonia   . Palliative care by specialist   . Goals of care,  counseling/discussion   . DNR (do not resuscitate) discussion   . Fecal impaction ([place]) 02/06/2018  . HCAP (healthcare-associated pneumonia) 02/06/2018  . Anemia   . Chronic anticoagulation   . Pressure injury of skin 02/04/2018  . Fracture   . Acute lower UTI   . Myelopathy ([place]) 09/21/2017  . Muscle spasms of both lower extremities   . Closed fracture of shaft of left femur ([place])   . Acute blood loss anemia   . Hypothyroidism   . Nausea & vomiting 09/15/2017  . Closed fracture of left distal femur ([place]) 09/11/2017  . Paraplegia ([place]) 09/08/2017  . Obesity (BMI 30-39.9) 09/03/2017  . Status post surgery 09/03/2017  . Calculus of gallbladder without cholecystitis without obstruction   . Biliary dyskinesia   . Intractable nausea and vomiting 08/09/2017  . Thrombocytopenia ([place]) 08/09/2017  . Elevated bilirubin 08/09/2017  . AKI (acute kidney injury) ([place]) 08/09/2017  . Nasal congestion 04/05/2017  . Breast pain 01/26/2017  . Upper respiratory infection, viral 01/26/2017  . Counseling regarding advanced care planning and goals of care 01/02/2017  . Bilateral rotator cuff syndrome 02/12/2016  . E. coli UTI 08/01/2015  . Constipation due to neurogenic bowel 08/01/2015  . Muscle spasticity   . 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 Spastic paraparesis   . Neurogenic bladder   . History of pulmonary embolism   . Post-operative pain   . Metastatic cancer ([place])   .  Steroid-induced hyperglycemia   . Depression   . Obstipation   . Thoracic spine tumor 07/06/2015  . 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 ([place]) 07/06/2015  . Spinal cord compression due to malignant neoplasm metastatic to spine ([place])   . Postoperative hypothyroidism   . Secondary malignant neoplasm of vertebral column ([place]) 03/08/2015  . Spasticity 09/19/2014  . Dysuria 09/04/2014  . Hypertension 05/01/2014  . Ingrown right big toenail 05/01/2014  . GERD (gastroesophageal reflux disease) 02/14/2014  . Dysphagia,  pharyngoesophageal phase 02/06/2014  . Type 2 diabetes mellitus without complication ([place]) 23/95/3202  . Constipation   . Neurogenic bowel   . Paraplegia at T4 level ([place]) 01/20/2014  . Metastatic cancer to spine ([place]) 01/19/2014  . Rotator cuff tendonitis   . 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 Hurthle cell carcinoma of thyroid ([place]) 12/29/2013  . Leg weakness, bilateral 12/13/2013    Past Surgical History:  Procedure Laterality Date  . CHOLECYSTECTOMY N/A 01/25/2018   Procedure: LAPAROSCOPIC CHOLECYSTECTOMY;  Surgeon: Virl Cagey, MD;  Location: [facility];  Service: General;  Laterality: N/A;  . FEMUR IM NAIL Left 09/11/2017   Procedure: INTRAMEDULLARY (IM) RETROGRADE FEMORAL NAILING;  Surgeon: Altamese [place], MD;  Location: [place];  Service: Orthopedics;  Laterality: Left;  . LAMINECTOMY N/A 12/14/2013   Procedure: THORACIC LAMINECTOMY FOR TUMOR THORACIC THREE;  Surgeon: Ashok Pall, MD;  Location: [facility];  Service: Neurosurgery;  Laterality: N/A;  THORACIC LAMINECTOMY FOR TUMOR THORACIC THREE  . LAMINECTOMY N/A 07/05/2015   Procedure: THORACIC LAMINECTOMY FOR TUMOR;  Surgeon: Ashok Pall, MD;  Location: [place] [facility];  Service: Neurosurgery;  Laterality: N/A;  . LAMINECTOMY N/A 09/03/2017   Procedure: POSTERIOR SPINAL TUMOR RESECTION THORACIC THREE;  Surgeon: Ashok Pall, MD;  Location: [place];  Service: Neurosurgery;  Laterality: N/A;  POSTERIOR SPINAL TUMOR RESECTION THORACIC THREE  . POSTERIOR LUMBAR FUSION 4 LEVEL N/A 12/30/2013   Procedure: Thoracic one-Thoracic five posterior thoracic fusion with pedicle screws;  Surgeon: Ashok Pall, MD;  Location: [place] [facility];  Service: Neurosurgery;  Laterality: N/A;  Thoracic one-Thoracic five posterior thoracic fusion with pedicle screws  . THYROIDECTOMY N/A 01/09/2014   Procedure: TOTAL THYROIDECTOMY;  Surgeon: Izora Gala, MD;  Location: [facility];  Service: ENT;  Laterality: N/A;  . TONSILLECTOMY       OB History    None      Home Medications    Prior to Admission medications   Medication Sig Start Date End Date Taking? Authorizing Provider  acetaminophen (TYLENOL) 325 MG tablet Take 2 tablets (650 mg total) by mouth every 4 (four) hours as needed for mild pain ((score 1 to 3) or temp > 100.5). 09/11/17   Angiulli, Lavon Paganini, PA-C  albuterol (PROVENTIL) (2.5 MG/3ML) 0.083% nebulizer solution Take 3 mLs (2.5 mg total) by nebulization every 6 (six) hours as needed for wheezing or shortness of breath. 02/23/18   Johnson, Clanford L, MD  Amino Acids-Protein Hydrolys (FEEDING SUPPLEMENT, PRO-STAT SUGAR FREE 64,) LIQD Take 30 mLs by mouth 3 (three) times daily between meals. 02/23/18   Johnson, Clanford L, MD  baclofen (LIORESAL) 10 MG tablet Take 25 mg by mouth 4 (four) times daily as needed for muscle spasms.  01/05/18   [provider]  dextromethorphan (DELSYM) 30 MG/5ML liquid Take 5 mLs (30 mg total) by mouth 2 (two) times daily as needed for cough. 02/23/18   Johnson, Clanford L, MD  famotidine (PEPCID) 20 MG tablet Take 1 tablet (20  mg total) by mouth 2 (two) times daily. 09/21/17   Roxan Hockey, MD  feeding supplement, ENSURE ENLIVE, (ENSURE ENLIVE) LIQD Take 237 mLs by mouth 3 (three) times daily between meals. 02/23/18   Johnson, Clanford L, MD  furosemide (LASIX) 40 MG tablet Take 1 tablet (40 mg total) by mouth every other day. 02/25/18   Johnson, Clanford L, MD  ipratropium-albuterol (DUONEB) 0.5-2.5 (3) MG/3ML SOLN Take 3 mLs by nebulization 3 (three) times daily. 02/23/18   Murlean Iba, MD  levothyroxine (SYNTHROID, LEVOTHROID) 150 MCG tablet Take 150 mcg by mouth daily before breakfast.  01/06/18   [provider]  loratadine (CLARITIN) 10 MG tablet Take 1 tablet (10 mg total) by mouth daily. 10/30/17   Angiulli, Lavon Paganini, PA-C  Multiple Vitamin (MULTIVITAMIN WITH MINERALS) TABS tablet Take 1 tablet by mouth daily. 02/24/18   Johnson, Clanford L, MD  ondansetron (ZOFRAN) 4 MG  tablet Take 1 tablet by mouth every 6 (six) hours as needed for nausea or vomiting.  07/25/14   [provider]  oxyCODONE (ROXICODONE) 5 MG immediate release tablet Take 1 tablet (5 mg total) by mouth every 6 (six) hours as needed for severe pain. 02/23/18 02/23/19  Johnson, Clanford L, MD  pantoprazole (PROTONIX) 40 MG tablet Take 1 tablet by mouth daily. 01/17/15   [provider]  polyethylene glycol (MIRALAX / GLYCOLAX) packet Take 17 g by mouth daily. 10/30/17   Angiulli, Lavon Paganini, PA-C  potassium chloride SA (K-DUR,KLOR-CON) 20 MEQ tablet Take 2 tablets (40 mEq total) by mouth every other day. 02/25/18   Johnson, Clanford L, MD  predniSONE (DELTASONE) 5 MG tablet Take 1 daily with breakfast x 5 days, then 0.5 tab daily x 5 days, then stop 02/24/18   Wynetta Emery, Clanford L, MD  rivaroxaban (XARELTO) 20 MG TABS tablet Take 20 mg by mouth daily with supper.    [provider]  senna (SENOKOT) 8.6 MG TABS tablet Take 1 tablet (8.6 mg total) by mouth at bedtime as needed for mild constipation. 02/23/18   Murlean Iba, MD  sertraline (ZOLOFT) 50 MG tablet  12/24/17   [provider]    Family History Family History  Problem Relation Age of Onset  . Hypertension Mother   . Diabetes Mother   . Hypertension Father   . Stroke Father   . Diabetes Father   . Diabetes Sister   . Diabetes Sister     Social History Social History   Tobacco Use  . Smoking status: Never Smoker  . Smokeless tobacco: Never Used  Substance Use Topics  . Alcohol use: No  . Drug use: No     Allergies   Bee venom; Keflex [cephalexin]; Tramadol; Gabapentin; Heparin; Hydrochlorothiazide; Hydrocodone; Oxycodone-acetaminophen; Sorbitol; and Tizanidine   Review of Systems Review of Systems  Constitutional: Negative for appetite change, chills, diaphoresis, fatigue and fever.  Respiratory: Negative for cough, shortness of breath and wheezing.   Cardiovascular: Negative for chest  pain and palpitations.  Gastrointestinal: Positive for abdominal pain. Negative for anal bleeding, constipation, diarrhea, nausea and vomiting.  Neurological: Positive for weakness and numbness. Negative for light-headedness.  All other systems reviewed and are negative.    Physical Exam Updated Vital Signs BP (!) 148/90   Pulse 79   Temp 99 F (37.2 C) (Oral)   Resp 16   Ht 5' 11"  (1.803 m)   Wt 97.1 kg   LMP 10/22/2016 (LMP Unknown)   SpO2 100%   BMI 29.85  kg/m   Physical Exam  Constitutional: She is oriented to person, place, and time. She appears well-developed and well-nourished. No distress.  HENT:  Head: Normocephalic and atraumatic.  Eyes: Pupils are equal, round, and reactive to light. EOM are normal.  Neck: Normal range of motion. Neck supple. No thyromegaly present.  Cardiovascular: Normal rate, regular rhythm and normal heart sounds.  Pulmonary/Chest: Effort normal and breath sounds normal. No respiratory distress.  Abdominal: Soft. Bowel sounds are normal. She exhibits no distension. There is no tenderness. There is no guarding.  Musculoskeletal: She exhibits edema (1+ BL lower extremity edema).  Neurological: She is alert and oriented to person, place, and time.  Complete paralysis in her bilateral lower extremities, no sensation to light touch or pinprick in bilateral lower extremities  Skin: She is not diaphoretic.  Cold lower extremity     ED Treatments / Results  Labs (all labs ordered are listed, but only abnormal results are displayed) Labs Reviewed  LIPASE, BLOOD   CMP Latest Ref Rng & Units 03/02/2018 02/22/2018 02/21/2018  Glucose 70 - 99 mg/dL 86 - 96  BUN 6 - 20 mg/dL 17 - 24(H)  Creatinine 0.44 - 1.00 mg/dL 1.46(H) 1.02(H) 0.95  Sodium 135 - 145 mmol/L 142 - 141  Potassium 3.5 - 5.1 mmol/L 4.1 - 3.5  Chloride 98 - 111 mmol/L 102 - 104  CO2 22 - 32 mmol/L 31 - 32  Calcium 8.9 - 10.3 mg/dL 8.3(L) - 7.6(L)  Total Protein 6.5 - 8.1 g/dL 6.5 - -   Total Bilirubin 0.3 - 1.2 mg/dL 0.9 - -  Alkaline Phos 38 - 126 U/L 644(H) - -  AST 15 - 41 U/L 639(HH) - -  ALT 0 - 44 U/L 309(HH) - -   EKG None  Radiology No results found.  Procedures Procedures (including critical care time)  Medications Ordered in ED Medications - No data to display   Initial Impression / Assessment and Plan / ED Course  I have reviewed the triage vital signs and the nursing notes.  Pertinent labs & imaging results that were available during my care of the patient were reviewed by me and considered in my medical decision making (see chart for details).     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  She noted to have an AST of 639, ALT 309, alk phos 644 and T bili 0.9.  CBC was notable for a WBC of 2.5, hemoglobin 11.1.  Will obtain CT scan to assess for postoperative cholecystectomy complications such as bilioma, remnant of stone in cystic duct. CT scan pending. She is not on any medications that cause liver damage except for tylenol, advised patient to discontinue this until she follows up with gastroenterology. Signed out to oncoming ED physician.   Final Clinical Impressions(s) / ED Diagnoses   Final diagnoses:  None    ED Discharge Orders    None       Asencion Noble, MD 03/02/18 1601    Asencion Noble, MD 03/02/18 1616    Isla Pence, MD 03/03/18 [phone]

## 2018-03-02 NOTE — ED Triage Notes (Signed)
Patient here from cancer center with complaints of abnormal labs. Elevated liver enzymes. Post op gall bladder removal.

## 2018-03-03 LAB — THYROGLOBULIN ANTIBODY: Thyroglobulin Antibody: <1 IU/mL (ref 0.0–0.9)

## 2018-03-04 DIAGNOSIS — G8220 Paraplegia, unspecified: Secondary | ICD-10-CM | POA: Diagnosis not present

## 2018-03-04 DIAGNOSIS — R945 Abnormal results of liver function studies: Secondary | ICD-10-CM | POA: Diagnosis not present

## 2018-03-04 DIAGNOSIS — D34 Benign neoplasm of thyroid gland: Secondary | ICD-10-CM | POA: Diagnosis not present

## 2018-03-04 DIAGNOSIS — J189 Pneumonia, unspecified organism: Secondary | ICD-10-CM | POA: Diagnosis not present

## 2018-03-08 ENCOUNTER — Ambulatory Visit (HOSPITAL_COMMUNITY): Payer: Medicare Other

## 2018-03-08 DIAGNOSIS — D638 Anemia in other chronic diseases classified elsewhere: Secondary | ICD-10-CM | POA: Diagnosis not present

## 2018-03-08 DIAGNOSIS — J189 Pneumonia, unspecified organism: Secondary | ICD-10-CM | POA: Diagnosis not present

## 2018-03-08 DIAGNOSIS — E039 Hypothyroidism, unspecified: Secondary | ICD-10-CM | POA: Diagnosis not present

## 2018-03-08 DIAGNOSIS — R945 Abnormal results of liver function studies: Secondary | ICD-10-CM | POA: Diagnosis not present

## 2018-03-11 DIAGNOSIS — M549 Dorsalgia, unspecified: Secondary | ICD-10-CM | POA: Diagnosis not present

## 2018-03-11 DIAGNOSIS — C7951 Secondary malignant neoplasm of bone: Secondary | ICD-10-CM | POA: Diagnosis not present

## 2018-03-11 DIAGNOSIS — G8929 Other chronic pain: Secondary | ICD-10-CM | POA: Diagnosis not present

## 2018-03-11 DIAGNOSIS — E890 Postprocedural hypothyroidism: Secondary | ICD-10-CM | POA: Diagnosis not present

## 2018-03-11 DIAGNOSIS — M519 Unspecified thoracic, thoracolumbar and lumbosacral intervertebral disc disorder: Secondary | ICD-10-CM | POA: Diagnosis not present

## 2018-03-11 DIAGNOSIS — I10 Essential (primary) hypertension: Secondary | ICD-10-CM | POA: Diagnosis not present

## 2018-03-11 DIAGNOSIS — M1990 Unspecified osteoarthritis, unspecified site: Secondary | ICD-10-CM | POA: Diagnosis not present

## 2018-03-11 DIAGNOSIS — C7931 Secondary malignant neoplasm of brain: Secondary | ICD-10-CM | POA: Diagnosis not present

## 2018-03-11 DIAGNOSIS — C73 Malignant neoplasm of thyroid gland: Secondary | ICD-10-CM | POA: Diagnosis not present

## 2018-03-11 DIAGNOSIS — Z86711 Personal history of pulmonary embolism: Secondary | ICD-10-CM | POA: Diagnosis not present

## 2018-03-11 DIAGNOSIS — G8220 Paraplegia, unspecified: Secondary | ICD-10-CM | POA: Diagnosis not present

## 2018-03-12 ENCOUNTER — Telehealth: Payer: Self-pay | Admitting: Family Medicine

## 2018-03-12 DIAGNOSIS — G8929 Other chronic pain: Secondary | ICD-10-CM | POA: Diagnosis not present

## 2018-03-12 DIAGNOSIS — C7931 Secondary malignant neoplasm of brain: Secondary | ICD-10-CM | POA: Diagnosis not present

## 2018-03-12 DIAGNOSIS — C73 Malignant neoplasm of thyroid gland: Secondary | ICD-10-CM | POA: Diagnosis not present

## 2018-03-12 DIAGNOSIS — Z86711 Personal history of pulmonary embolism: Secondary | ICD-10-CM | POA: Diagnosis not present

## 2018-03-12 DIAGNOSIS — M549 Dorsalgia, unspecified: Secondary | ICD-10-CM | POA: Diagnosis not present

## 2018-03-12 DIAGNOSIS — M1990 Unspecified osteoarthritis, unspecified site: Secondary | ICD-10-CM | POA: Diagnosis not present

## 2018-03-12 DIAGNOSIS — E890 Postprocedural hypothyroidism: Secondary | ICD-10-CM | POA: Diagnosis not present

## 2018-03-12 DIAGNOSIS — I10 Essential (primary) hypertension: Secondary | ICD-10-CM | POA: Diagnosis not present

## 2018-03-12 DIAGNOSIS — C7951 Secondary malignant neoplasm of bone: Secondary | ICD-10-CM | POA: Diagnosis not present

## 2018-03-12 DIAGNOSIS — G8220 Paraplegia, unspecified: Secondary | ICD-10-CM | POA: Diagnosis not present

## 2018-03-12 DIAGNOSIS — M519 Unspecified thoracic, thoracolumbar and lumbosacral intervertebral disc disorder: Secondary | ICD-10-CM | POA: Diagnosis not present

## 2018-03-12 NOTE — Telephone Encounter (Signed)
Roxy Manns from [facility] is calling requesting verbal orders for this pt.   Physical therapy 2xweek for 6weeks.  The best call back is [phone]

## 2018-03-12 NOTE — Telephone Encounter (Signed)
Verbal orders given to [facility] ok per Dr. Andy Gauss.  Jazmin Hartsell,CMA

## 2018-03-13 LAB — THYROGLOBULIN LEVEL: Thyroglobulin: >500 ng/mL — ABNORMAL HIGH

## 2018-03-18 DIAGNOSIS — G8929 Other chronic pain: Secondary | ICD-10-CM | POA: Diagnosis not present

## 2018-03-18 DIAGNOSIS — C7931 Secondary malignant neoplasm of brain: Secondary | ICD-10-CM | POA: Diagnosis not present

## 2018-03-18 DIAGNOSIS — I10 Essential (primary) hypertension: Secondary | ICD-10-CM | POA: Diagnosis not present

## 2018-03-18 DIAGNOSIS — C73 Malignant neoplasm of thyroid gland: Secondary | ICD-10-CM | POA: Diagnosis not present

## 2018-03-18 DIAGNOSIS — M1990 Unspecified osteoarthritis, unspecified site: Secondary | ICD-10-CM | POA: Diagnosis not present

## 2018-03-18 DIAGNOSIS — Z86711 Personal history of pulmonary embolism: Secondary | ICD-10-CM | POA: Diagnosis not present

## 2018-03-18 DIAGNOSIS — E890 Postprocedural hypothyroidism: Secondary | ICD-10-CM | POA: Diagnosis not present

## 2018-03-18 DIAGNOSIS — G8220 Paraplegia, unspecified: Secondary | ICD-10-CM | POA: Diagnosis not present

## 2018-03-18 DIAGNOSIS — M519 Unspecified thoracic, thoracolumbar and lumbosacral intervertebral disc disorder: Secondary | ICD-10-CM | POA: Diagnosis not present

## 2018-03-18 DIAGNOSIS — C7951 Secondary malignant neoplasm of bone: Secondary | ICD-10-CM | POA: Diagnosis not present

## 2018-03-18 DIAGNOSIS — M549 Dorsalgia, unspecified: Secondary | ICD-10-CM | POA: Diagnosis not present

## 2018-03-19 ENCOUNTER — Other Ambulatory Visit: Payer: Self-pay | Admitting: Radiation Therapy

## 2018-03-19 ENCOUNTER — Telehealth: Payer: Self-pay | Admitting: *Deleted

## 2018-03-19 DIAGNOSIS — G8220 Paraplegia, unspecified: Secondary | ICD-10-CM | POA: Diagnosis not present

## 2018-03-19 DIAGNOSIS — C7951 Secondary malignant neoplasm of bone: Secondary | ICD-10-CM

## 2018-03-19 DIAGNOSIS — M1990 Unspecified osteoarthritis, unspecified site: Secondary | ICD-10-CM | POA: Diagnosis not present

## 2018-03-19 DIAGNOSIS — M519 Unspecified thoracic, thoracolumbar and lumbosacral intervertebral disc disorder: Secondary | ICD-10-CM | POA: Diagnosis not present

## 2018-03-19 DIAGNOSIS — G8929 Other chronic pain: Secondary | ICD-10-CM | POA: Diagnosis not present

## 2018-03-19 DIAGNOSIS — G9529 Other cord compression: Secondary | ICD-10-CM

## 2018-03-19 DIAGNOSIS — C73 Malignant neoplasm of thyroid gland: Secondary | ICD-10-CM | POA: Diagnosis not present

## 2018-03-19 DIAGNOSIS — Z86711 Personal history of pulmonary embolism: Secondary | ICD-10-CM | POA: Diagnosis not present

## 2018-03-19 DIAGNOSIS — E890 Postprocedural hypothyroidism: Secondary | ICD-10-CM | POA: Diagnosis not present

## 2018-03-19 DIAGNOSIS — C7931 Secondary malignant neoplasm of brain: Secondary | ICD-10-CM | POA: Diagnosis not present

## 2018-03-19 DIAGNOSIS — I10 Essential (primary) hypertension: Secondary | ICD-10-CM | POA: Diagnosis not present

## 2018-03-19 DIAGNOSIS — M549 Dorsalgia, unspecified: Secondary | ICD-10-CM | POA: Diagnosis not present

## 2018-03-19 NOTE — Telephone Encounter (Signed)
Patient states that she has met her deductible this year and is asking if Dr. Naaman Plummer would submit a script/request/referral for a standing frame. She feels it would be beneficial and if Dr. Naaman Plummer agrees should would appreciate it

## 2018-03-19 NOTE — Telephone Encounter (Signed)
Internal order for DME (standing frame) placed in epic

## 2018-03-22 NOTE — Telephone Encounter (Signed)
Patient notified

## 2018-03-23 ENCOUNTER — Encounter (HOSPITAL_COMMUNITY): Payer: Medicare Other

## 2018-03-23 DIAGNOSIS — G8221 Paraplegia, complete: Secondary | ICD-10-CM | POA: Diagnosis not present

## 2018-03-23 DIAGNOSIS — C7931 Secondary malignant neoplasm of brain: Secondary | ICD-10-CM | POA: Diagnosis not present

## 2018-03-23 DIAGNOSIS — M1990 Unspecified osteoarthritis, unspecified site: Secondary | ICD-10-CM | POA: Diagnosis not present

## 2018-03-23 DIAGNOSIS — G8929 Other chronic pain: Secondary | ICD-10-CM | POA: Diagnosis not present

## 2018-03-23 DIAGNOSIS — M549 Dorsalgia, unspecified: Secondary | ICD-10-CM | POA: Diagnosis not present

## 2018-03-23 DIAGNOSIS — G9520 Unspecified cord compression: Secondary | ICD-10-CM | POA: Diagnosis not present

## 2018-03-23 DIAGNOSIS — C73 Malignant neoplasm of thyroid gland: Secondary | ICD-10-CM | POA: Diagnosis not present

## 2018-03-23 DIAGNOSIS — C799 Secondary malignant neoplasm of unspecified site: Secondary | ICD-10-CM | POA: Diagnosis not present

## 2018-03-23 DIAGNOSIS — M519 Unspecified thoracic, thoracolumbar and lumbosacral intervertebral disc disorder: Secondary | ICD-10-CM | POA: Diagnosis not present

## 2018-03-23 DIAGNOSIS — G8220 Paraplegia, unspecified: Secondary | ICD-10-CM | POA: Diagnosis not present

## 2018-03-23 DIAGNOSIS — C7951 Secondary malignant neoplasm of bone: Secondary | ICD-10-CM | POA: Diagnosis not present

## 2018-03-23 DIAGNOSIS — E890 Postprocedural hypothyroidism: Secondary | ICD-10-CM | POA: Diagnosis not present

## 2018-03-23 DIAGNOSIS — Z86711 Personal history of pulmonary embolism: Secondary | ICD-10-CM | POA: Diagnosis not present

## 2018-03-23 DIAGNOSIS — I10 Essential (primary) hypertension: Secondary | ICD-10-CM | POA: Diagnosis not present

## 2018-03-24 DIAGNOSIS — J189 Pneumonia, unspecified organism: Secondary | ICD-10-CM

## 2018-03-24 HISTORY — DX: Pneumonia, unspecified organism: J18.9

## 2018-03-26 DIAGNOSIS — M549 Dorsalgia, unspecified: Secondary | ICD-10-CM | POA: Diagnosis not present

## 2018-03-26 DIAGNOSIS — G8220 Paraplegia, unspecified: Secondary | ICD-10-CM | POA: Diagnosis not present

## 2018-03-26 DIAGNOSIS — Z86711 Personal history of pulmonary embolism: Secondary | ICD-10-CM | POA: Diagnosis not present

## 2018-03-26 DIAGNOSIS — M519 Unspecified thoracic, thoracolumbar and lumbosacral intervertebral disc disorder: Secondary | ICD-10-CM | POA: Diagnosis not present

## 2018-03-26 DIAGNOSIS — C7951 Secondary malignant neoplasm of bone: Secondary | ICD-10-CM | POA: Diagnosis not present

## 2018-03-26 DIAGNOSIS — M1990 Unspecified osteoarthritis, unspecified site: Secondary | ICD-10-CM | POA: Diagnosis not present

## 2018-03-26 DIAGNOSIS — I10 Essential (primary) hypertension: Secondary | ICD-10-CM | POA: Diagnosis not present

## 2018-03-26 DIAGNOSIS — C73 Malignant neoplasm of thyroid gland: Secondary | ICD-10-CM | POA: Diagnosis not present

## 2018-03-26 DIAGNOSIS — E890 Postprocedural hypothyroidism: Secondary | ICD-10-CM | POA: Diagnosis not present

## 2018-03-26 DIAGNOSIS — C7931 Secondary malignant neoplasm of brain: Secondary | ICD-10-CM | POA: Diagnosis not present

## 2018-03-26 DIAGNOSIS — G8929 Other chronic pain: Secondary | ICD-10-CM | POA: Diagnosis not present

## 2018-03-30 DIAGNOSIS — G8220 Paraplegia, unspecified: Secondary | ICD-10-CM | POA: Diagnosis not present

## 2018-03-30 DIAGNOSIS — C73 Malignant neoplasm of thyroid gland: Secondary | ICD-10-CM | POA: Diagnosis not present

## 2018-03-30 DIAGNOSIS — G8929 Other chronic pain: Secondary | ICD-10-CM | POA: Diagnosis not present

## 2018-03-30 DIAGNOSIS — C7931 Secondary malignant neoplasm of brain: Secondary | ICD-10-CM | POA: Diagnosis not present

## 2018-03-30 DIAGNOSIS — C7951 Secondary malignant neoplasm of bone: Secondary | ICD-10-CM | POA: Diagnosis not present

## 2018-03-30 DIAGNOSIS — M549 Dorsalgia, unspecified: Secondary | ICD-10-CM | POA: Diagnosis not present

## 2018-03-30 DIAGNOSIS — E890 Postprocedural hypothyroidism: Secondary | ICD-10-CM | POA: Diagnosis not present

## 2018-03-30 DIAGNOSIS — M1990 Unspecified osteoarthritis, unspecified site: Secondary | ICD-10-CM | POA: Diagnosis not present

## 2018-03-30 DIAGNOSIS — Z86711 Personal history of pulmonary embolism: Secondary | ICD-10-CM | POA: Diagnosis not present

## 2018-03-30 DIAGNOSIS — I10 Essential (primary) hypertension: Secondary | ICD-10-CM | POA: Diagnosis not present

## 2018-03-30 DIAGNOSIS — M519 Unspecified thoracic, thoracolumbar and lumbosacral intervertebral disc disorder: Secondary | ICD-10-CM | POA: Diagnosis not present

## 2018-03-31 ENCOUNTER — Ambulatory Visit (HOSPITAL_COMMUNITY): Payer: Medicare Other

## 2018-04-01 DIAGNOSIS — G8929 Other chronic pain: Secondary | ICD-10-CM | POA: Diagnosis not present

## 2018-04-01 DIAGNOSIS — C7931 Secondary malignant neoplasm of brain: Secondary | ICD-10-CM | POA: Diagnosis not present

## 2018-04-01 DIAGNOSIS — C7951 Secondary malignant neoplasm of bone: Secondary | ICD-10-CM | POA: Diagnosis not present

## 2018-04-01 DIAGNOSIS — C73 Malignant neoplasm of thyroid gland: Secondary | ICD-10-CM | POA: Diagnosis not present

## 2018-04-01 DIAGNOSIS — M519 Unspecified thoracic, thoracolumbar and lumbosacral intervertebral disc disorder: Secondary | ICD-10-CM | POA: Diagnosis not present

## 2018-04-01 DIAGNOSIS — Z86711 Personal history of pulmonary embolism: Secondary | ICD-10-CM | POA: Diagnosis not present

## 2018-04-01 DIAGNOSIS — G8220 Paraplegia, unspecified: Secondary | ICD-10-CM | POA: Diagnosis not present

## 2018-04-01 DIAGNOSIS — I10 Essential (primary) hypertension: Secondary | ICD-10-CM | POA: Diagnosis not present

## 2018-04-01 DIAGNOSIS — E890 Postprocedural hypothyroidism: Secondary | ICD-10-CM | POA: Diagnosis not present

## 2018-04-01 DIAGNOSIS — M1990 Unspecified osteoarthritis, unspecified site: Secondary | ICD-10-CM | POA: Diagnosis not present

## 2018-04-01 DIAGNOSIS — M549 Dorsalgia, unspecified: Secondary | ICD-10-CM | POA: Diagnosis not present

## 2018-04-06 ENCOUNTER — Ambulatory Visit: Payer: Medicare Other | Admitting: Family Medicine

## 2018-04-06 DIAGNOSIS — G8220 Paraplegia, unspecified: Secondary | ICD-10-CM | POA: Diagnosis not present

## 2018-04-06 DIAGNOSIS — C7931 Secondary malignant neoplasm of brain: Secondary | ICD-10-CM | POA: Diagnosis not present

## 2018-04-06 DIAGNOSIS — C73 Malignant neoplasm of thyroid gland: Secondary | ICD-10-CM | POA: Diagnosis not present

## 2018-04-06 DIAGNOSIS — G8929 Other chronic pain: Secondary | ICD-10-CM | POA: Diagnosis not present

## 2018-04-06 DIAGNOSIS — M549 Dorsalgia, unspecified: Secondary | ICD-10-CM | POA: Diagnosis not present

## 2018-04-06 DIAGNOSIS — M519 Unspecified thoracic, thoracolumbar and lumbosacral intervertebral disc disorder: Secondary | ICD-10-CM | POA: Diagnosis not present

## 2018-04-06 DIAGNOSIS — I10 Essential (primary) hypertension: Secondary | ICD-10-CM | POA: Diagnosis not present

## 2018-04-06 DIAGNOSIS — Z86711 Personal history of pulmonary embolism: Secondary | ICD-10-CM | POA: Diagnosis not present

## 2018-04-06 DIAGNOSIS — E890 Postprocedural hypothyroidism: Secondary | ICD-10-CM | POA: Diagnosis not present

## 2018-04-06 DIAGNOSIS — M1990 Unspecified osteoarthritis, unspecified site: Secondary | ICD-10-CM | POA: Diagnosis not present

## 2018-04-06 DIAGNOSIS — C7951 Secondary malignant neoplasm of bone: Secondary | ICD-10-CM | POA: Diagnosis not present

## 2018-04-07 ENCOUNTER — Telehealth: Payer: Self-pay | Admitting: *Deleted

## 2018-04-07 ENCOUNTER — Encounter (HOSPITAL_COMMUNITY): Admission: RE | Admit: 2018-04-07 | Payer: Medicare Other | Source: Ambulatory Visit

## 2018-04-07 NOTE — Telephone Encounter (Signed)
Sarah Billow,  Ms. Mcclees is my patient but I have never met her in the two years she has been mine. I would not know what orders [name] needs. If she wants a verbal to continue services that patient was receiving, we can give her that. However, I cannot give her a plan as she is requesting at this time.  Thanks  [name], MD [place], PGY-3

## 2018-04-07 NOTE — Telephone Encounter (Signed)
Sarah [name] is aware that patient has an appt tomorrow and is not requesting a call / orders till after that appt. [name], [name], CMA

## 2018-04-07 NOTE — Telephone Encounter (Signed)
Rory Percy with [facility] is calling because the Nursing orders have ran out on this pt.  Pt has appt tomorrow with Dr. Andy Gauss.  [name] is asking that Dr.Diallo call her with a plan of care.  She also wanted to let MD know that patient has a ulcer on her left heel that they have been covering with Calcium algenate.  [place] # [phone]  Fax info/orders to [phone]  [name], [name], [place]

## 2018-04-08 ENCOUNTER — Ambulatory Visit (INDEPENDENT_AMBULATORY_CARE_PROVIDER_SITE_OTHER): Payer: Medicare Other | Admitting: Family Medicine

## 2018-04-08 ENCOUNTER — Other Ambulatory Visit: Payer: Self-pay

## 2018-04-08 ENCOUNTER — Encounter: Payer: Self-pay | Admitting: Family Medicine

## 2018-04-08 VITALS — BP 120/66 | HR 60 | Temp 97.7°F

## 2018-04-08 DIAGNOSIS — D649 Anemia, unspecified: Secondary | ICD-10-CM

## 2018-04-08 DIAGNOSIS — R740 Nonspecific elevation of levels of transaminase and lactic acid dehydrogenase [LDH]: Secondary | ICD-10-CM | POA: Diagnosis not present

## 2018-04-08 DIAGNOSIS — K5641 Fecal impaction: Secondary | ICD-10-CM

## 2018-04-08 DIAGNOSIS — J189 Pneumonia, unspecified organism: Secondary | ICD-10-CM | POA: Diagnosis not present

## 2018-04-08 DIAGNOSIS — C73 Malignant neoplasm of thyroid gland: Secondary | ICD-10-CM

## 2018-04-08 DIAGNOSIS — R7401 Elevation of levels of liver transaminase levels: Secondary | ICD-10-CM

## 2018-04-08 DIAGNOSIS — R6889 Other general symptoms and signs: Secondary | ICD-10-CM | POA: Diagnosis not present

## 2018-04-08 DIAGNOSIS — E8809 Other disorders of plasma-protein metabolism, not elsewhere classified: Secondary | ICD-10-CM

## 2018-04-08 NOTE — Patient Instructions (Signed)
It was great seeing you today! We have addressed the following issues today  1. We will do blood work to follow up on your liver and kidney function  2. Make sur your take your Vit D/Calcium supplement 3. Let me know if the spasm continue to be bothersome 4. Continue to [place] incentive spirometry  5. Make an appointment to see me in a month.  If we did any lab work today, and the results require attention, either me or my nurse will get in touch with you. If everything is normal, you will get a letter in mail and a message via . If you don't hear from [place] in two weeks, please give [place] a call. Otherwise, we look forward to seeing you again at your next visit. If you have any questions or concerns before then, please call the clinic at [phone].  Please bring all your medications to every doctors visit  Sign up for My Chart to have easy access to your labs results, and communication with your Primary care physician. Please ask Front Desk for some assistance.   Please check-out at the front desk before leaving the clinic.    Take Care,   Dr. Andy Gauss

## 2018-04-09 DIAGNOSIS — M549 Dorsalgia, unspecified: Secondary | ICD-10-CM | POA: Diagnosis not present

## 2018-04-09 DIAGNOSIS — R7401 Elevation of levels of liver transaminase levels: Secondary | ICD-10-CM | POA: Insufficient documentation

## 2018-04-09 DIAGNOSIS — G8220 Paraplegia, unspecified: Secondary | ICD-10-CM | POA: Diagnosis not present

## 2018-04-09 DIAGNOSIS — C73 Malignant neoplasm of thyroid gland: Secondary | ICD-10-CM | POA: Diagnosis not present

## 2018-04-09 DIAGNOSIS — M1990 Unspecified osteoarthritis, unspecified site: Secondary | ICD-10-CM | POA: Diagnosis not present

## 2018-04-09 DIAGNOSIS — R740 Nonspecific elevation of levels of transaminase and lactic acid dehydrogenase [LDH]: Principal | ICD-10-CM

## 2018-04-09 DIAGNOSIS — G8929 Other chronic pain: Secondary | ICD-10-CM | POA: Diagnosis not present

## 2018-04-09 DIAGNOSIS — E8809 Other disorders of plasma-protein metabolism, not elsewhere classified: Secondary | ICD-10-CM | POA: Insufficient documentation

## 2018-04-09 DIAGNOSIS — C7951 Secondary malignant neoplasm of bone: Secondary | ICD-10-CM | POA: Diagnosis not present

## 2018-04-09 DIAGNOSIS — M519 Unspecified thoracic, thoracolumbar and lumbosacral intervertebral disc disorder: Secondary | ICD-10-CM | POA: Diagnosis not present

## 2018-04-09 DIAGNOSIS — Z86711 Personal history of pulmonary embolism: Secondary | ICD-10-CM | POA: Diagnosis not present

## 2018-04-09 DIAGNOSIS — E890 Postprocedural hypothyroidism: Secondary | ICD-10-CM | POA: Diagnosis not present

## 2018-04-09 DIAGNOSIS — I10 Essential (primary) hypertension: Secondary | ICD-10-CM | POA: Diagnosis not present

## 2018-04-09 DIAGNOSIS — C7931 Secondary malignant neoplasm of brain: Secondary | ICD-10-CM | POA: Diagnosis not present

## 2018-04-09 LAB — CBC WITH DIFFERENTIAL/PLATELET
Basophils Absolute: 0 10*3/uL (ref 0.0–0.2)
Basos: 1 %
EOS (ABSOLUTE): 0.1 10*3/uL (ref 0.0–0.4)
Eos: 3 %
HEMATOCRIT: 29.6 % — AB (ref 34.0–46.6)
HEMOGLOBIN: 9.7 g/dL — AB (ref 11.1–15.9)
Immature Grans (Abs): 0 10*3/uL (ref 0.0–0.1)
Immature Granulocytes: 1 %
Lymphocytes Absolute: 0.7 10*3/uL (ref 0.7–3.1)
Lymphs: 24 %
MCH: 30.6 pg (ref 26.6–33.0)
MCHC: 32.8 g/dL (ref 31.5–35.7)
MCV: 93 fL (ref 79–97)
Monocytes Absolute: 0.2 10*3/uL (ref 0.1–0.9)
Monocytes: 8 %
Neutrophils Absolute: 2 10*3/uL (ref 1.4–7.0)
Neutrophils: 63 %
Platelets: 191 10*3/uL (ref 150–450)
RBC: 3.17 x10E6/uL — ABNORMAL LOW (ref 3.77–5.28)
RDW: 17.9 % — AB (ref 11.7–15.4)
WBC: 3.2 10*3/uL — ABNORMAL LOW (ref 3.4–10.8)

## 2018-04-09 LAB — CMP14+EGFR
ALK PHOS: 151 IU/L — AB (ref 39–117)
ALT: 6 IU/L (ref 0–32)
AST: 14 IU/L (ref 0–40)
Albumin/Globulin Ratio: 0.9 — ABNORMAL LOW (ref 1.2–2.2)
Albumin: 2.7 g/dL — ABNORMAL LOW (ref 3.5–5.5)
BUN/Creatinine Ratio: 10 (ref 9–23)
BUN: 13 mg/dL (ref 6–24)
Bilirubin Total: 0.6 mg/dL (ref 0.0–1.2)
CO2: 23 mmol/L (ref 20–29)
CREATININE: 1.24 mg/dL — AB (ref 0.57–1.00)
Calcium: 8.2 mg/dL — ABNORMAL LOW (ref 8.7–10.2)
Chloride: 101 mmol/L (ref 96–106)
GFR calc Af Amer: 58 mL/min/{1.73_m2} — ABNORMAL LOW (ref >59)
GFR calc non Af Amer: 50 mL/min/{1.73_m2} — ABNORMAL LOW (ref >59)
Globulin, Total: 3.1 g/dL (ref 1.5–4.5)
Glucose: 76 mg/dL (ref 65–99)
Potassium: 4.1 mmol/L (ref 3.5–5.2)
Sodium: 139 mmol/L (ref 134–144)
Total Protein: 5.8 g/dL — ABNORMAL LOW (ref 6.0–8.5)

## 2018-04-09 LAB — GAMMA GT: GGT: 35 IU/L (ref 0–60)

## 2018-04-09 NOTE — Progress Notes (Signed)
Subjective:    Patient ID: Sarah [name], female    DOB: 11-30-1965, 54 y.o.   MRN: [id number]   CC: Follow-up for recent discharge from rehab  HPI: [name] a 53 year old female with past medical history significant for metastatic thyroid cancer, paraplegia at T4 level, chronic back pain, hypertension, hypothyroidism presents today to follow-up after recent hospital admission for multifocal pneumonia followed up by short stay at rehabilitation facility (S[facility], [facility]).  Patient reports that she has been doing well since discharge.  She was discharged from rehab a month ago.  Patient currently denies any shortness of breath.  She continues incentive spirometry at home.  Endorse lower extremity spasms with previous and baclofen given paraplegia but was taken off when she was in rehab.  Altered mental status thought to be secondary to baclofen.  Patient will follow-up with oncology given newly found brain mets.  She is currently receiving physical therapy at home as well as wound care for left heel wound.  She also has the help of a nurse aide at home.  [name] on Xarelto for history of PE.  She has discontinued Lasix given significant improvement with lower extremity swelling.  [name] aware that her albumin has been low and  was told to increase her protein intake.  [name] also on a bowel regimen to help with history of constipation.  Patient reports significant emesis in the past.  She currently denies any chest pain, shortness of breath, abdominal pain, nausea, vomiting, headaches, vision change.  Smoking status reviewed   ROS: all other systems were reviewed and are negative other than in the HPI   Past Medical History:  Diagnosis Date  . Cancer [facility])    FNA of thyroid positive for onconytic/hurthle cell carcinoma  . Chronic back pain   . Complication of anesthesia    Nausea and vomiting  . DDD (degenerative disc disease), cervical   . DJD (degenerative joint disease)   .  Family history of adverse reaction to anesthesia    MOTHER HAS NAUSEA   . GERD (gastroesophageal reflux disease)   . History of rectal fissure   . HIT (heparin-induced thrombocytopenia) ([place])   . Hypertension   . Hypothyroidism   . Obesity   . Paraplegia at T4 level ([place])   . Pulmonary embolus, right ([place]) 2015  . Rotator cuff tendonitis right    Past Surgical History:  Procedure Laterality Date  . CHOLECYSTECTOMY N/A 01/25/2018   Procedure: LAPAROSCOPIC CHOLECYSTECTOMY;  Surgeon: Virl Cagey, MD;  Location: [facility];  Service: General;  Laterality: N/A;  . FEMUR IM NAIL Left 09/11/2017   Procedure: INTRAMEDULLARY (IM) RETROGRADE FEMORAL NAILING;  Surgeon: Altamese [place], MD;  Location: [facility];  Service: Orthopedics;  Laterality: Left;  . LAMINECTOMY N/A 12/14/2013   Procedure: THORACIC LAMINECTOMY FOR TUMOR THORACIC THREE;  Surgeon: Ashok Pall, MD;  Location: [place] [facility];  Service: Neurosurgery;  Laterality: N/A;  THORACIC LAMINECTOMY FOR TUMOR THORACIC THREE  . LAMINECTOMY N/A 07/05/2015   Procedure: THORACIC LAMINECTOMY FOR TUMOR;  Surgeon: Ashok Pall, MD;  Location: [place] [facility];  Service: Neurosurgery;  Laterality: N/A;  . LAMINECTOMY N/A 09/03/2017   Procedure: POSTERIOR SPINAL TUMOR RESECTION THORACIC THREE;  Surgeon: Ashok Pall, MD;  Location: [facility];  Service: Neurosurgery;  Laterality: N/A;  POSTERIOR SPINAL TUMOR RESECTION THORACIC THREE  . POSTERIOR LUMBAR FUSION 4 LEVEL N/A 12/30/2013   Procedure: Thoracic one-Thoracic five posterior thoracic fusion with pedicle screws;  Surgeon: Ashok Pall,  MD;  Location: [place] [facility];  Service: Neurosurgery;  Laterality: N/A;  Thoracic one-Thoracic five posterior thoracic fusion with pedicle screws  . THYROIDECTOMY N/A 01/09/2014   Procedure: TOTAL THYROIDECTOMY;  Surgeon: Izora Gala, MD;  Location: [place] [facility];  Service: ENT;  Laterality: N/A;  . TONSILLECTOMY      Past medical history, surgical, family, and social history reviewed  and updated in the EMR as appropriate.  Objective:  BP 120/66   Pulse 60   Temp 97.7 F (36.5 C) (Oral)   LMP 10/22/2016 (LMP Unknown)   SpO2 99%   Vitals and nursing note reviewed  General: Wheelchair-bound woman, NAD, pleasant, able to participate in exam Cardiac: RRR, normal heart sounds, no murmurs. 2+ radial and PT pulses bilaterally Respiratory: CTAB, normal effort, No wheezes, rales or rhonchi Abdomen: soft, nontender, nondistended, no hepatic or splenomegaly, +BS Extremities: Left heel wound healing with good granulation tissue no signs of bleeding or drainage. Skin: warm and dry, no rashes noted Neuro: alert and oriented x4, no focal deficits Psych: Normal affect and mood   Assessment & Plan:   Transaminitis Patient had elevated AST, ALT, alkaline phosphatase during last CMP.  Patient reports that she was told that transaminitis are secondary to antibiotic regimen.  Patient had her gallbladder removed a few months ago.  She denies any abdominal pain, jaundice, diarrhea.  Given history of cancer and elevated liver enzymes.  We will repeat CMP today to assess need for further work-up.  Abnormal level could be secondary to metastasis to liver.  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  Patient states that she was previously on baclofen which was discontinued during her rehab stay due to concern for confusion.  [name] on calcium vitamin D supplementation but has been nonadherent to it.  Suspect specificity secondary to paraplegia possible electrolyte abnormalities.  Recommend need to be adherent to her calcium and vitamin D supplementation.  If spasm continues to be bothersome will likely resume baclofen.  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  Fecal impaction ([place]) Continue current bowel regimen.  Nausea vomiting abdominal pain has completely resolved.  Follow-up on  apparent basis.  Hurthle cell carcinoma of thyroid (HCC) Recent brain mets seen on CT head.  Patient will need MRI to further assess extent of disease.  [name] followed by oncology.  Will make an appointment to discuss further management.  She has had already multiple rounds of chemo and radiation.  Anemia Patient has had multiple blood transfusion during this hospitalization with hemoglobin as low as 7.4.  Patient was evaluated by GI while inpatient and found no obvious source of bleeding.  Patient has been on Xarelto for history of PE.  On discharge from hospital at the end of November hemoglobin was stable around 9.9.  Last hemoglobin was 11.1 a month ago.  Will recheck CBC today.  She will continue Xarelto given history of cancer and hypercoagulability.  Will evaluate need for further work-up if hemoglobin continues to drop.  Multifocal pneumonia Multifocal pneumonia treated and resolved.  Patient has completed antibiotic course.  She will continue to use incentive spirometry.  Lung exam was unremarkable.  We will continue to monitor.  Hypoalbuminemia Patient reports significant improvement in anasarca secondary to hypoalbuminemia.  Patient was discharged on Lasix but has not taken in the past few days given improvement in volume status.  Discussed increasing protein  in diet.  Last albumin was 2.6.  Recommend increase protein could consider nutrition referral will consider nutrition referral if this becomes a chronic problem.    [name], MD [place] PGY-3

## 2018-04-09 NOTE — Assessment & Plan Note (Signed)
Patient had elevated AST, ALT, alkaline phosphatase during last CMP.  Patient reports that she was told that transaminitis are secondary to antibiotic regimen.  Patient had her gallbladder removed a few months ago.  She denies any abdominal pain, jaundice, diarrhea.  Given history of cancer and elevated liver enzymes.  We will repeat CMP today to assess need for further work-up.  Abnormal level could be secondary to metastasis to liver.

## 2018-04-09 NOTE — Assessment & Plan Note (Signed)
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  Patient will need MRI to further assess extent of disease.  [name] followed by oncology.  Will make an appointment to discuss further management.  She has had already multiple rounds of chemo and radiation.

## 2018-04-09 NOTE — Assessment & Plan Note (Signed)
Patient has had multiple blood transfusion during this hospitalization with hemoglobin as low as 7.4.  Patient was evaluated by GI while inpatient and found no obvious source of bleeding.  Patient has been on Xarelto for history of PE.  On discharge from hospital at the end of November hemoglobin was stable around 9.9.  Last hemoglobin was 11.1 a month ago.  Will recheck CBC today.  She will continue Xarelto given history of cancer and hypercoagulability.  Will evaluate need for further work-up if hemoglobin continues to drop.

## 2018-04-09 NOTE — Assessment & Plan Note (Signed)
Patient reports lower extremity spasm for the past few weeks.  Patient states that she was previously on baclofen which was discontinued during her rehab stay due to concern for confusion.  [name] on calcium vitamin D supplementation but has been nonadherent to it.  Suspect specificity secondary to paraplegia possible electrolyte abnormalities.  Recommend need to be adherent to her calcium and vitamin D supplementation.  If spasm continues to be bothersome will likely resume baclofen.  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

## 2018-04-09 NOTE — Assessment & Plan Note (Signed)
Patient reports significant improvement in anasarca secondary to hypoalbuminemia.  Patient was discharged on Lasix but has not taken in the past few days given improvement in volume status.  Discussed increasing protein in diet.  Last albumin was 2.6.  Recommend increase protein could consider nutrition referral will consider nutrition referral if this becomes a chronic problem.

## 2018-04-09 NOTE — Assessment & Plan Note (Signed)
Continue current bowel regimen.  Nausea vomiting abdominal pain has completely resolved.  Follow-up on apparent basis.

## 2018-04-09 NOTE — Assessment & Plan Note (Signed)
Multifocal pneumonia treated and resolved.  Patient has completed antibiotic course.  She will continue to use incentive spirometry.  Lung exam was unremarkable.  We will continue to monitor.

## 2018-04-11 NOTE — Addendum Note (Signed)
Encounter addended by: Tyler Pita, MD on: 04/11/2018 2:31 PM  Actions taken: Clinical Note Signed

## 2018-04-13 ENCOUNTER — Telehealth: Payer: Self-pay

## 2018-04-13 DIAGNOSIS — C7951 Secondary malignant neoplasm of bone: Secondary | ICD-10-CM | POA: Diagnosis not present

## 2018-04-13 DIAGNOSIS — G8929 Other chronic pain: Secondary | ICD-10-CM | POA: Diagnosis not present

## 2018-04-13 DIAGNOSIS — Z86711 Personal history of pulmonary embolism: Secondary | ICD-10-CM | POA: Diagnosis not present

## 2018-04-13 DIAGNOSIS — I10 Essential (primary) hypertension: Secondary | ICD-10-CM | POA: Diagnosis not present

## 2018-04-13 DIAGNOSIS — M519 Unspecified thoracic, thoracolumbar and lumbosacral intervertebral disc disorder: Secondary | ICD-10-CM | POA: Diagnosis not present

## 2018-04-13 DIAGNOSIS — M549 Dorsalgia, unspecified: Secondary | ICD-10-CM | POA: Diagnosis not present

## 2018-04-13 DIAGNOSIS — C7931 Secondary malignant neoplasm of brain: Secondary | ICD-10-CM | POA: Diagnosis not present

## 2018-04-13 DIAGNOSIS — E890 Postprocedural hypothyroidism: Secondary | ICD-10-CM | POA: Diagnosis not present

## 2018-04-13 DIAGNOSIS — M1990 Unspecified osteoarthritis, unspecified site: Secondary | ICD-10-CM | POA: Diagnosis not present

## 2018-04-13 DIAGNOSIS — C73 Malignant neoplasm of thyroid gland: Secondary | ICD-10-CM | POA: Diagnosis not present

## 2018-04-13 DIAGNOSIS — G8220 Paraplegia, unspecified: Secondary | ICD-10-CM | POA: Diagnosis not present

## 2018-04-13 NOTE — Telephone Encounter (Signed)
Pt calling nurse for lab results. Please advise.

## 2018-04-13 NOTE — Telephone Encounter (Signed)
I will call her and discuss the results of her lab work.  Thank you

## 2018-04-14 NOTE — Telephone Encounter (Signed)
Janett Billow,  Could you give sharon verbal orders to continue wound care, PT and home aide orders that patient previously.  Thanks  [name], MD [place], PGY-3

## 2018-04-15 ENCOUNTER — Inpatient Hospital Stay: Payer: Medicare Other | Admitting: Hematology

## 2018-04-15 ENCOUNTER — Inpatient Hospital Stay: Payer: Medicare Other | Attending: Hematology

## 2018-04-15 DIAGNOSIS — M1990 Unspecified osteoarthritis, unspecified site: Secondary | ICD-10-CM | POA: Diagnosis not present

## 2018-04-15 DIAGNOSIS — E890 Postprocedural hypothyroidism: Secondary | ICD-10-CM | POA: Diagnosis not present

## 2018-04-15 DIAGNOSIS — M519 Unspecified thoracic, thoracolumbar and lumbosacral intervertebral disc disorder: Secondary | ICD-10-CM | POA: Diagnosis not present

## 2018-04-15 DIAGNOSIS — C73 Malignant neoplasm of thyroid gland: Secondary | ICD-10-CM | POA: Diagnosis not present

## 2018-04-15 DIAGNOSIS — M549 Dorsalgia, unspecified: Secondary | ICD-10-CM | POA: Diagnosis not present

## 2018-04-15 DIAGNOSIS — C7951 Secondary malignant neoplasm of bone: Secondary | ICD-10-CM | POA: Diagnosis not present

## 2018-04-15 DIAGNOSIS — G8220 Paraplegia, unspecified: Secondary | ICD-10-CM | POA: Diagnosis not present

## 2018-04-15 DIAGNOSIS — Z86711 Personal history of pulmonary embolism: Secondary | ICD-10-CM | POA: Diagnosis not present

## 2018-04-15 DIAGNOSIS — I10 Essential (primary) hypertension: Secondary | ICD-10-CM | POA: Diagnosis not present

## 2018-04-15 DIAGNOSIS — C7931 Secondary malignant neoplasm of brain: Secondary | ICD-10-CM | POA: Diagnosis not present

## 2018-04-15 DIAGNOSIS — G8929 Other chronic pain: Secondary | ICD-10-CM | POA: Diagnosis not present

## 2018-04-16 NOTE — Telephone Encounter (Signed)
lmovm informing [name] to continue orders per Dr. Andy Gauss. Mikayela Deats, [name], CMA

## 2018-04-20 DIAGNOSIS — C7951 Secondary malignant neoplasm of bone: Secondary | ICD-10-CM | POA: Diagnosis not present

## 2018-04-20 DIAGNOSIS — C73 Malignant neoplasm of thyroid gland: Secondary | ICD-10-CM | POA: Diagnosis not present

## 2018-04-20 DIAGNOSIS — G8929 Other chronic pain: Secondary | ICD-10-CM | POA: Diagnosis not present

## 2018-04-20 DIAGNOSIS — M549 Dorsalgia, unspecified: Secondary | ICD-10-CM | POA: Diagnosis not present

## 2018-04-20 DIAGNOSIS — I10 Essential (primary) hypertension: Secondary | ICD-10-CM | POA: Diagnosis not present

## 2018-04-20 DIAGNOSIS — M1990 Unspecified osteoarthritis, unspecified site: Secondary | ICD-10-CM | POA: Diagnosis not present

## 2018-04-20 DIAGNOSIS — G8220 Paraplegia, unspecified: Secondary | ICD-10-CM | POA: Diagnosis not present

## 2018-04-20 DIAGNOSIS — M519 Unspecified thoracic, thoracolumbar and lumbosacral intervertebral disc disorder: Secondary | ICD-10-CM | POA: Diagnosis not present

## 2018-04-20 DIAGNOSIS — C7931 Secondary malignant neoplasm of brain: Secondary | ICD-10-CM | POA: Diagnosis not present

## 2018-04-20 DIAGNOSIS — Z86711 Personal history of pulmonary embolism: Secondary | ICD-10-CM | POA: Diagnosis not present

## 2018-04-20 DIAGNOSIS — E890 Postprocedural hypothyroidism: Secondary | ICD-10-CM | POA: Diagnosis not present

## 2018-04-21 ENCOUNTER — Telehealth: Payer: Self-pay | Admitting: Radiation Therapy

## 2018-04-21 ENCOUNTER — Other Ambulatory Visit: Payer: Self-pay | Admitting: Radiation Therapy

## 2018-04-21 DIAGNOSIS — C7949 Secondary malignant neoplasm of other parts of nervous system: Principal | ICD-10-CM

## 2018-04-21 DIAGNOSIS — C7931 Secondary malignant neoplasm of brain: Secondary | ICD-10-CM

## 2018-04-21 NOTE — Telephone Encounter (Signed)
I have been unable to reach [name] to discuss the recommended brain MRI scan. Her voicemail box is full and she has not answered the calls. I was however, able to speak with her mother to request another contact number for Sarah [name]. Her mom took my name and number and said that she will have Sarah [name] call me back.   Mont Dutton R.T.(R)(T) Special Procedures Navigator  [phone]

## 2018-04-22 ENCOUNTER — Telehealth: Payer: Self-pay | Admitting: Hematology

## 2018-04-22 DIAGNOSIS — M519 Unspecified thoracic, thoracolumbar and lumbosacral intervertebral disc disorder: Secondary | ICD-10-CM | POA: Diagnosis not present

## 2018-04-22 DIAGNOSIS — G8929 Other chronic pain: Secondary | ICD-10-CM | POA: Diagnosis not present

## 2018-04-22 DIAGNOSIS — C7931 Secondary malignant neoplasm of brain: Secondary | ICD-10-CM | POA: Diagnosis not present

## 2018-04-22 DIAGNOSIS — M549 Dorsalgia, unspecified: Secondary | ICD-10-CM | POA: Diagnosis not present

## 2018-04-22 DIAGNOSIS — G8220 Paraplegia, unspecified: Secondary | ICD-10-CM | POA: Diagnosis not present

## 2018-04-22 DIAGNOSIS — E890 Postprocedural hypothyroidism: Secondary | ICD-10-CM | POA: Diagnosis not present

## 2018-04-22 DIAGNOSIS — M1990 Unspecified osteoarthritis, unspecified site: Secondary | ICD-10-CM | POA: Diagnosis not present

## 2018-04-22 DIAGNOSIS — C73 Malignant neoplasm of thyroid gland: Secondary | ICD-10-CM | POA: Diagnosis not present

## 2018-04-22 DIAGNOSIS — Z86711 Personal history of pulmonary embolism: Secondary | ICD-10-CM | POA: Diagnosis not present

## 2018-04-22 DIAGNOSIS — C7951 Secondary malignant neoplasm of bone: Secondary | ICD-10-CM | POA: Diagnosis not present

## 2018-04-22 DIAGNOSIS — I10 Essential (primary) hypertension: Secondary | ICD-10-CM | POA: Diagnosis not present

## 2018-04-22 NOTE — Telephone Encounter (Signed)
Scheduled appt per 1/30 sch message - pt is aware of appt date and time

## 2018-04-26 DIAGNOSIS — I10 Essential (primary) hypertension: Secondary | ICD-10-CM | POA: Diagnosis not present

## 2018-04-26 DIAGNOSIS — E890 Postprocedural hypothyroidism: Secondary | ICD-10-CM | POA: Diagnosis not present

## 2018-04-26 DIAGNOSIS — M1990 Unspecified osteoarthritis, unspecified site: Secondary | ICD-10-CM | POA: Diagnosis not present

## 2018-04-26 DIAGNOSIS — C73 Malignant neoplasm of thyroid gland: Secondary | ICD-10-CM | POA: Diagnosis not present

## 2018-04-26 DIAGNOSIS — G8220 Paraplegia, unspecified: Secondary | ICD-10-CM | POA: Diagnosis not present

## 2018-04-26 DIAGNOSIS — M519 Unspecified thoracic, thoracolumbar and lumbosacral intervertebral disc disorder: Secondary | ICD-10-CM | POA: Diagnosis not present

## 2018-04-26 DIAGNOSIS — C7931 Secondary malignant neoplasm of brain: Secondary | ICD-10-CM | POA: Diagnosis not present

## 2018-04-26 DIAGNOSIS — C7951 Secondary malignant neoplasm of bone: Secondary | ICD-10-CM | POA: Diagnosis not present

## 2018-04-26 DIAGNOSIS — G8929 Other chronic pain: Secondary | ICD-10-CM | POA: Diagnosis not present

## 2018-04-26 DIAGNOSIS — M549 Dorsalgia, unspecified: Secondary | ICD-10-CM | POA: Diagnosis not present

## 2018-04-26 DIAGNOSIS — Z86711 Personal history of pulmonary embolism: Secondary | ICD-10-CM | POA: Diagnosis not present

## 2018-04-27 DIAGNOSIS — E890 Postprocedural hypothyroidism: Secondary | ICD-10-CM | POA: Diagnosis not present

## 2018-04-27 DIAGNOSIS — M519 Unspecified thoracic, thoracolumbar and lumbosacral intervertebral disc disorder: Secondary | ICD-10-CM | POA: Diagnosis not present

## 2018-04-27 DIAGNOSIS — M1990 Unspecified osteoarthritis, unspecified site: Secondary | ICD-10-CM | POA: Diagnosis not present

## 2018-04-27 DIAGNOSIS — C7951 Secondary malignant neoplasm of bone: Secondary | ICD-10-CM | POA: Diagnosis not present

## 2018-04-27 DIAGNOSIS — G8220 Paraplegia, unspecified: Secondary | ICD-10-CM | POA: Diagnosis not present

## 2018-04-27 DIAGNOSIS — Z86711 Personal history of pulmonary embolism: Secondary | ICD-10-CM | POA: Diagnosis not present

## 2018-04-27 DIAGNOSIS — G8929 Other chronic pain: Secondary | ICD-10-CM | POA: Diagnosis not present

## 2018-04-27 DIAGNOSIS — M549 Dorsalgia, unspecified: Secondary | ICD-10-CM | POA: Diagnosis not present

## 2018-04-27 DIAGNOSIS — C7931 Secondary malignant neoplasm of brain: Secondary | ICD-10-CM | POA: Diagnosis not present

## 2018-04-27 DIAGNOSIS — C73 Malignant neoplasm of thyroid gland: Secondary | ICD-10-CM | POA: Diagnosis not present

## 2018-04-27 DIAGNOSIS — I10 Essential (primary) hypertension: Secondary | ICD-10-CM | POA: Diagnosis not present

## 2018-04-29 ENCOUNTER — Telehealth: Payer: Self-pay

## 2018-04-29 ENCOUNTER — Other Ambulatory Visit: Payer: Self-pay | Admitting: Family Medicine

## 2018-04-29 DIAGNOSIS — M549 Dorsalgia, unspecified: Secondary | ICD-10-CM | POA: Diagnosis not present

## 2018-04-29 DIAGNOSIS — G8220 Paraplegia, unspecified: Secondary | ICD-10-CM | POA: Diagnosis not present

## 2018-04-29 DIAGNOSIS — I10 Essential (primary) hypertension: Secondary | ICD-10-CM | POA: Diagnosis not present

## 2018-04-29 DIAGNOSIS — C7951 Secondary malignant neoplasm of bone: Secondary | ICD-10-CM | POA: Diagnosis not present

## 2018-04-29 DIAGNOSIS — C73 Malignant neoplasm of thyroid gland: Secondary | ICD-10-CM | POA: Diagnosis not present

## 2018-04-29 DIAGNOSIS — Z86711 Personal history of pulmonary embolism: Secondary | ICD-10-CM | POA: Diagnosis not present

## 2018-04-29 DIAGNOSIS — E890 Postprocedural hypothyroidism: Secondary | ICD-10-CM | POA: Diagnosis not present

## 2018-04-29 DIAGNOSIS — C7931 Secondary malignant neoplasm of brain: Secondary | ICD-10-CM | POA: Diagnosis not present

## 2018-04-29 DIAGNOSIS — G8929 Other chronic pain: Secondary | ICD-10-CM | POA: Diagnosis not present

## 2018-04-29 DIAGNOSIS — M519 Unspecified thoracic, thoracolumbar and lumbosacral intervertebral disc disorder: Secondary | ICD-10-CM | POA: Diagnosis not present

## 2018-04-29 DIAGNOSIS — M1990 Unspecified osteoarthritis, unspecified site: Secondary | ICD-10-CM | POA: Diagnosis not present

## 2018-04-29 MED ORDER — BACLOFEN 10 MG PO TABS: 25.0000 mg | ORAL_TABLET | Freq: Four times a day (QID) | ORAL | 1 refills | Status: DC | PRN

## 2018-04-29 NOTE — Telephone Encounter (Signed)
PT with [facility] (could not understand name), called.   Patient was taken off Baclofen at hospitalization. [name] having extreme muscle spasms and heightened sensitivity in legs. PT is unable to even work with her and having difficulty even moving her without pain.  Asks for advice on what to do or if Baclofen should be re-prescribed.  Patient [phone] PT [phone]  Danley Danker, RN [facility] RN)

## 2018-04-29 NOTE — Telephone Encounter (Signed)
Please let patient know that I will restart baclofen today.   Thanks  [name], MD [place], PGY-3

## 2018-04-29 NOTE — Telephone Encounter (Signed)
Patient informed.  Jazmin Hartsell,CMA  

## 2018-04-30 ENCOUNTER — Encounter (HOSPITAL_COMMUNITY): Payer: Medicare Other

## 2018-05-04 DIAGNOSIS — C73 Malignant neoplasm of thyroid gland: Secondary | ICD-10-CM | POA: Diagnosis not present

## 2018-05-04 DIAGNOSIS — I10 Essential (primary) hypertension: Secondary | ICD-10-CM | POA: Diagnosis not present

## 2018-05-04 DIAGNOSIS — G8220 Paraplegia, unspecified: Secondary | ICD-10-CM | POA: Diagnosis not present

## 2018-05-04 DIAGNOSIS — C7931 Secondary malignant neoplasm of brain: Secondary | ICD-10-CM | POA: Diagnosis not present

## 2018-05-04 DIAGNOSIS — M1990 Unspecified osteoarthritis, unspecified site: Secondary | ICD-10-CM | POA: Diagnosis not present

## 2018-05-04 DIAGNOSIS — M519 Unspecified thoracic, thoracolumbar and lumbosacral intervertebral disc disorder: Secondary | ICD-10-CM | POA: Diagnosis not present

## 2018-05-04 DIAGNOSIS — Z86711 Personal history of pulmonary embolism: Secondary | ICD-10-CM | POA: Diagnosis not present

## 2018-05-04 DIAGNOSIS — C7951 Secondary malignant neoplasm of bone: Secondary | ICD-10-CM | POA: Diagnosis not present

## 2018-05-04 DIAGNOSIS — M549 Dorsalgia, unspecified: Secondary | ICD-10-CM | POA: Diagnosis not present

## 2018-05-04 DIAGNOSIS — G8929 Other chronic pain: Secondary | ICD-10-CM | POA: Diagnosis not present

## 2018-05-04 DIAGNOSIS — E890 Postprocedural hypothyroidism: Secondary | ICD-10-CM | POA: Diagnosis not present

## 2018-05-05 ENCOUNTER — Ambulatory Visit (HOSPITAL_COMMUNITY): Payer: Medicare Other

## 2018-05-06 DIAGNOSIS — M1990 Unspecified osteoarthritis, unspecified site: Secondary | ICD-10-CM | POA: Diagnosis not present

## 2018-05-06 DIAGNOSIS — I10 Essential (primary) hypertension: Secondary | ICD-10-CM | POA: Diagnosis not present

## 2018-05-06 DIAGNOSIS — M519 Unspecified thoracic, thoracolumbar and lumbosacral intervertebral disc disorder: Secondary | ICD-10-CM | POA: Diagnosis not present

## 2018-05-06 DIAGNOSIS — M549 Dorsalgia, unspecified: Secondary | ICD-10-CM | POA: Diagnosis not present

## 2018-05-06 DIAGNOSIS — C7951 Secondary malignant neoplasm of bone: Secondary | ICD-10-CM | POA: Diagnosis not present

## 2018-05-06 DIAGNOSIS — G8220 Paraplegia, unspecified: Secondary | ICD-10-CM | POA: Diagnosis not present

## 2018-05-06 DIAGNOSIS — Z86711 Personal history of pulmonary embolism: Secondary | ICD-10-CM | POA: Diagnosis not present

## 2018-05-06 DIAGNOSIS — G8929 Other chronic pain: Secondary | ICD-10-CM | POA: Diagnosis not present

## 2018-05-06 DIAGNOSIS — C73 Malignant neoplasm of thyroid gland: Secondary | ICD-10-CM | POA: Diagnosis not present

## 2018-05-06 DIAGNOSIS — E890 Postprocedural hypothyroidism: Secondary | ICD-10-CM | POA: Diagnosis not present

## 2018-05-06 DIAGNOSIS — C7931 Secondary malignant neoplasm of brain: Secondary | ICD-10-CM | POA: Diagnosis not present

## 2018-05-07 ENCOUNTER — Inpatient Hospital Stay: Payer: Medicare Other

## 2018-05-07 ENCOUNTER — Telehealth: Payer: Self-pay | Admitting: Hematology

## 2018-05-07 ENCOUNTER — Inpatient Hospital Stay: Payer: Medicare Other | Admitting: Hematology

## 2018-05-07 NOTE — Telephone Encounter (Signed)
Scheduled appt per 2/14 sch message - pt is aware of apt date and time

## 2018-05-10 ENCOUNTER — Telehealth: Payer: Self-pay | Admitting: *Deleted

## 2018-05-10 NOTE — Telephone Encounter (Signed)
Pt states that she has a sinus infection and wants to know if MD will call her in something for it.  Advised that she would need an appt.  She states "well I will call the pharmacy and see if I can take this left over penicillian"  Told pt that she should not have nor take any left over Abx and asked that she make an appt.    She states that she will call back when she find transportation. Sarah [name], Sarah [name], CMA

## 2018-05-11 DIAGNOSIS — G8220 Paraplegia, unspecified: Secondary | ICD-10-CM | POA: Diagnosis not present

## 2018-05-11 DIAGNOSIS — I10 Essential (primary) hypertension: Secondary | ICD-10-CM | POA: Diagnosis not present

## 2018-05-11 DIAGNOSIS — Z86711 Personal history of pulmonary embolism: Secondary | ICD-10-CM | POA: Diagnosis not present

## 2018-05-11 DIAGNOSIS — M549 Dorsalgia, unspecified: Secondary | ICD-10-CM | POA: Diagnosis not present

## 2018-05-11 DIAGNOSIS — G8929 Other chronic pain: Secondary | ICD-10-CM | POA: Diagnosis not present

## 2018-05-11 DIAGNOSIS — C7931 Secondary malignant neoplasm of brain: Secondary | ICD-10-CM | POA: Diagnosis not present

## 2018-05-11 DIAGNOSIS — E890 Postprocedural hypothyroidism: Secondary | ICD-10-CM | POA: Diagnosis not present

## 2018-05-11 DIAGNOSIS — C73 Malignant neoplasm of thyroid gland: Secondary | ICD-10-CM | POA: Diagnosis not present

## 2018-05-11 DIAGNOSIS — M1990 Unspecified osteoarthritis, unspecified site: Secondary | ICD-10-CM | POA: Diagnosis not present

## 2018-05-11 DIAGNOSIS — M519 Unspecified thoracic, thoracolumbar and lumbosacral intervertebral disc disorder: Secondary | ICD-10-CM | POA: Diagnosis not present

## 2018-05-11 DIAGNOSIS — C7951 Secondary malignant neoplasm of bone: Secondary | ICD-10-CM | POA: Diagnosis not present

## 2018-05-11 NOTE — Telephone Encounter (Signed)
Janett Billow,   Thank you for the FYI, yes patient will need an appt. Hopefully she can work on her transportation issues. Keep me posted.  [name], MD [place], PGY-3

## 2018-05-13 ENCOUNTER — Encounter (HOSPITAL_COMMUNITY)
Admission: RE | Admit: 2018-05-13 | Discharge: 2018-05-13 | Disposition: A | Discharge status: Home / Self Care | Payer: Medicare Other | Source: Ambulatory Visit | Attending: Hematology | Admitting: Hematology

## 2018-05-13 ENCOUNTER — Other Ambulatory Visit: Payer: Self-pay | Admitting: Radiation Therapy

## 2018-05-13 DIAGNOSIS — R6889 Other general symptoms and signs: Secondary | ICD-10-CM | POA: Diagnosis not present

## 2018-05-13 DIAGNOSIS — C73 Malignant neoplasm of thyroid gland: Secondary | ICD-10-CM | POA: Insufficient documentation

## 2018-05-13 DIAGNOSIS — C7951 Secondary malignant neoplasm of bone: Secondary | ICD-10-CM

## 2018-05-13 DIAGNOSIS — Z86711 Personal history of pulmonary embolism: Secondary | ICD-10-CM | POA: Insufficient documentation

## 2018-05-13 LAB — GLUCOSE, CAPILLARY: Glucose-Capillary: 77 mg/dL (ref 70–99)

## 2018-05-13 MED ORDER — FLUDEOXYGLUCOSE F - 18 (FDG) INJECTION
10.6700 | Freq: Once | INTRAVENOUS | Status: AC
  Administered 2018-05-13: 10.67 via INTRAVENOUS

## 2018-05-17 ENCOUNTER — Telehealth: Payer: Self-pay | Admitting: *Deleted

## 2018-05-17 NOTE — Telephone Encounter (Signed)
Patient called, left voice mail: Has MRI tomorrow and wanted to know if Dr.Kale could prescribe something to keep her calm. Message given to [name].

## 2018-05-17 NOTE — Telephone Encounter (Signed)
Following further review - patient's MRI ordered by Dr. [name]. Contacted patient and recommended patient contact his office.

## 2018-05-18 ENCOUNTER — Telehealth: Payer: Self-pay | Admitting: Radiation Oncology

## 2018-05-18 ENCOUNTER — Other Ambulatory Visit: Payer: Self-pay | Admitting: Urology

## 2018-05-18 ENCOUNTER — Ambulatory Visit (HOSPITAL_COMMUNITY)
Admission: RE | Admit: 2018-05-18 | Discharge: 2018-05-18 | Disposition: A | Discharge status: Home / Self Care | Payer: Medicare Other | Source: Ambulatory Visit | Attending: Radiation Oncology | Admitting: Radiation Oncology

## 2018-05-18 DIAGNOSIS — C7931 Secondary malignant neoplasm of brain: Secondary | ICD-10-CM | POA: Diagnosis not present

## 2018-05-18 DIAGNOSIS — C7949 Secondary malignant neoplasm of other parts of nervous system: Secondary | ICD-10-CM | POA: Diagnosis not present

## 2018-05-18 DIAGNOSIS — D320 Benign neoplasm of cerebral meninges: Secondary | ICD-10-CM | POA: Diagnosis not present

## 2018-05-18 DIAGNOSIS — C7951 Secondary malignant neoplasm of bone: Secondary | ICD-10-CM | POA: Diagnosis not present

## 2018-05-18 MED ORDER — GADOBUTROL 1 MMOL/ML IV SOLN
10.0000 mL | Freq: Once | INTRAVENOUS | Status: AC | PRN
  Administered 2018-05-18: 10 mL via INTRAVENOUS

## 2018-05-18 MED ORDER — LORAZEPAM 1 MG PO TABS: 1.0000 mg | ORAL_TABLET | ORAL | 0 refills | Status: DC | PRN

## 2018-05-18 NOTE — Telephone Encounter (Signed)
-----   Message from Freeman Caldron, [place] sent at 05/18/2018  9:07 AM EST ----- Regarding: RE: pt requesting Ativan for tomorrow's MRI This has been sent.  Please let patient know that she can call her pharmacy to let them know when she will be coming by to pick it up today. -Ashlyn ----- Message ----- From: Pincus Large Sent: 05/17/2018   4:47 PM EST To: Freeman Caldron, PA-C, Heywood Footman, RN Subject: pt requesting Ativan for tomorrow's MRI        Sarah [name] just called requesting some Ativan for tomorrow's MRI. She is scheduled to arrive at [facility] at 12:30 to check in and has asked that the prescription be called into [facility] for her to pick up Tuesday morning. I let her know that Ashlyn and Dr. [name] are not in today, but I will send a message for Ashlyn and [place] R.T.(R)(T)

## 2018-05-18 NOTE — Telephone Encounter (Signed)
Phoned patient. Informed her that the Ativan she requested has been escribed to [facility]. The patient verbalized understanding and expressed appreciation for the call.

## 2018-05-18 NOTE — Progress Notes (Signed)
[name] Kitchen    HEMATOLOGY/ONCOLOGY CLINIC NOTE  Date of Service: 05/19/18   Patient Care Team: [name], MD as PCP - General (Family Medicine) [name], MD as Consulting Physician (Physical Medicine and Rehabilitation) Ashok Pall, MD as Consulting Physician (Neurosurgery) Brunetta Genera, MD as Consulting Physician (Hematology) Jacelyn Pi, MD as Consulting Physician (Endocrinology)   Dayton Bailiff MD (Spine [place]) [phone]. 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  01/09/2014- patient underwent total thyroidectomy by Dr. Elie Goody. Pathology showed Hurthle cell carcinoma with no perineural invasion, presence of lymphovascular invasion and extra thyroidal extension. p T3, p N0, Mx  Received 30 grays in 10 fractions to her C7-T6 lesion finished in November 2015  Received 135 mCi of iodine 131 on 07/21/2014. Whole-body scan was apparently negative.  07/05/2015-07/10/2015 admitted to [facility] for leg weakness. MRI of the T-spine showed recurrent tumor causing spinal cord compression. Patient was emergently taken to the or and underwent a redo thoracic laminectomy of T3 and partial tumor resection.   08/07/2015 PET/CT scan of the chest abdomen pelvis showed slight enlargement of FDG avid left level III and level IV lymph nodes concerning for worsening nodal metastases. Slight enlargement of the hypermetabolic right superior mediastinal mass and multiple metabolically active pulmonary nodules representing worsening metastases. T3 vertebral body and bilateral pedicle metastases status  post posture metallic fusion. No residual uptake in the thyroidectomy bed  Thyroglobulin level was 11.7 (thyroglobulin antibody present 9.8)   HISTORY OF PRESENTING ILLNESS:   ROCKELLE HEUERMAN is a wonderful 53 y.o. female who has been referred to [place] by Dr Leamon Arnt (medical oncology at [facility]) for help with local comanagement of the patient's metastatic Hurthle cell thyroid carcinoma with details as noted above. Patient had recent hospitalization from 4/13/7 to 07/10/2015 at [facility] for lower extremity weakness and was noted to have cord compression from recurrent tumor at T3. She is status post redo thoracic laminectomy of T3 and partial tumor resection to try to decompress the core. Pathology was consistent with metastatic Hurthle cell carcinoma.  She has been on dexamethasone 2 mg by mouth twice a day. Continue to have lower extremity weakness and is currently in a skilled nursing facility getting rehabilitation. She feels her lower extremity weakness is possibly getting somewhat better.  Patient had a repeat PET/CT scan at [facility] on 08/07/2015 as noted above which shows progression of her metastatic disease including nodal metastases , pulmonary nodules and right superior mediastinal mass.  MRI of the spine on 08/14/2015 showed improved but persistent cord compression at T3 level.  She was started by Dr. Leamon Arnt on Lenvatinib and 14 mg by mouth daily for progressive metastatic disease after ruling out the option of repeat surgery with Dr. Cyndy Freeze and reradiation with Dr. Ledon Snare.  After a week or so the patient was increased to a dose of 33m po daily. She notes no diarrhea or other gastrointestinal symptoms. No skin rashes.  Is noted to have oral thrush and was started on nystatin swish and swallow.   Patient has a history of pulmonary  embolism 2015 and has continued to be on Xarelto for anticoagulation.   INTERVAL HISTORY  KRISSIA SCHREIER returns today for a  follow up regarding her Metastatic Hurthle cell thyroid carcinoma. The patient's last visit with [place] was on 03/02/18. She is accompanied today by her sister. The pt reports that she is doing well overall.   The pt reports that she feels that she might have a sinus infection, and endorses sinus pressure above her eyebrows. The pt notes that she is not staying well hydrated, because the left side of her face as felt numb and tingly which has been the case for the past week. She notes that the left back side of her head has also been hurting intermittently for the last week. She notes that her left ear feels as though there is cotton inside it.  The pt was discharged back home from the nursing home just before 03/17/18. She does not have a help at home service, but her sister provides much care for the pt.  The pt notes that her back continues to hurt. She recently began 66m Baclofen BID . She has recently developed leg swelling in her ankles as well.   The pt notes that she has some abdominal pains, and is moving her bowels every few days, which she notes is her baseline. She notes some association of her abdominal pain with needing to have a bowel movement.  The pt denies problems with bleeding while continuing on Xarelto.  The pt notes that she feels light headed when she sits up. Her BP in clinic today is 76/45.  The pt will be returning to care with her PCP Dr. AMarjie Skiff  Of note since the patient's last visit, pt has had a PET/CT completed on 05/13/18 with results revealing Multifocal bilateral hypermetabolic pulmonary nodules are again noted. These appear increased in size when compared with the previous exam. Additionally, there is a new hypermetabolic nodule in the right middle lobe. 2. Hypermetabolic right paratracheal lymph node continues exhibits intense FDG uptake. This is not significantly changed in size but exhibits mild decrease in degree of FDG uptake. 3. Hypermetabolic left  supraclavicular lymph nodes are again noted. These continue to exhibit similar degrees of intense uptake. The index left supraclavicular lymph node is mildly increased in size in the interval. 4. Increase in size and degree of FDG uptake associated with hypermetabolic right sternal lesion. 5. Persistent intense FDG uptake associated with T3 lesion. The degree of FDG uptake appears mildly improved when compared with previous exam. 6. New bilateral pleural effusions. There is also new bilateral lower extremity edema.  Lab results today (05/19/18) of CBC w/diff and CMP is as follows: all values are WNL except for WBC at 2.7k, RBC at 2.75, HGB at 9.0, HCT at 28.0, MCV at 101.8, RDW at 16.6, PLT at 119k, Lymphs abs at 500, Glucose at 106, Creatinine at 1.41, Calcium at 8.0, Total Protein at 6.0, Albumin at 2.6, AST at 14, Alk Phos at 161, GFR at 43. 05/19/18 Thyroglobulin antibody is pending  On review of systems, pt reports sinus pressure, stable energy levels, intermittent left sided head pain, left ear sense of stuffiness, nasal congestion, ankle swelling, some abdominal pains, and denies fevers, chills, mastoid tenderness to palpation, and any other symptoms.  MEDICAL HISTORY:  Past Medical History:  Diagnosis Date  . Cancer ([facility]    FNA of thyroid positive for onconytic/hurthle cell carcinoma  . Chronic back pain   . Complication of  anesthesia    Nausea and vomiting  . DDD (degenerative disc disease), cervical   . DJD (degenerative joint disease)   . Family history of adverse reaction to anesthesia    MOTHER HAS NAUSEA   . GERD (gastroesophageal reflux disease)   . History of rectal fissure   . HIT (heparin-induced thrombocytopenia) ([place])   . Hypertension   . Hypothyroidism   . Obesity   . Paraplegia at T4 level ([place])   . Pulmonary embolus, right ([place]) 2015  . Rotator cuff tendonitis right    SURGICAL HISTORY: Past Surgical History:  Procedure Laterality Date  . CHOLECYSTECTOMY N/A  01/25/2018   Procedure: LAPAROSCOPIC CHOLECYSTECTOMY;  Surgeon: Virl Cagey, MD;  Location: [facility];  Service: General;  Laterality: N/A;  . FEMUR IM NAIL Left 09/11/2017   Procedure: INTRAMEDULLARY (IM) RETROGRADE FEMORAL NAILING;  Surgeon: Altamese North Hills, MD;  Location: [place];  Service: Orthopedics;  Laterality: Left;  . LAMINECTOMY N/A 12/14/2013   Procedure: THORACIC LAMINECTOMY FOR TUMOR THORACIC THREE;  Surgeon: Ashok Pall, MD;  Location: [place] [facility];  Service: Neurosurgery;  Laterality: N/A;  THORACIC LAMINECTOMY FOR TUMOR THORACIC THREE  . LAMINECTOMY N/A 07/05/2015   Procedure: THORACIC LAMINECTOMY FOR TUMOR;  Surgeon: Ashok Pall, MD;  Location: [place] [facility];  Service: Neurosurgery;  Laterality: N/A;  . LAMINECTOMY N/A 09/03/2017   Procedure: POSTERIOR SPINAL TUMOR RESECTION THORACIC THREE;  Surgeon: Ashok Pall, MD;  Location: [place];  Service: Neurosurgery;  Laterality: N/A;  POSTERIOR SPINAL TUMOR RESECTION THORACIC THREE  . POSTERIOR LUMBAR FUSION 4 LEVEL N/A 12/30/2013   Procedure: Thoracic one-Thoracic five posterior thoracic fusion with pedicle screws;  Surgeon: Ashok Pall, MD;  Location: [place] [facility];  Service: Neurosurgery;  Laterality: N/A;  Thoracic one-Thoracic five posterior thoracic fusion with pedicle screws  . THYROIDECTOMY N/A 01/09/2014   Procedure: TOTAL THYROIDECTOMY;  Surgeon: Izora Gala, MD;  Location: [place];  Service: ENT;  Laterality: N/A;  . TONSILLECTOMY      SOCIAL HISTORY: Social History   Socioeconomic History  . Marital status: Single    Spouse name: Not on file  . Number of children: Not on file  . Years of education: Not on file  . Highest education level: Not on file  Occupational History  . Not on file  Social Needs  . Financial resource strain: Not on file  . Food insecurity:    Worry: Not on file    Inability: Not on file  . Transportation needs:    Medical: Not on file    Non-medical: Not on file  Tobacco Use  . Smoking  status: Never Smoker  . Smokeless tobacco: Never Used  Substance and Sexual Activity  . Alcohol use: No  . Drug use: No  . Sexual activity: Not Currently  Lifestyle  . Physical activity:    Days per week: Not on file    Minutes per session: Not on file  . Stress: Not on file  Relationships  . Social connections:    Talks on phone: Not on file    Gets together: Not on file    Attends religious service: Not on file    Active member of club or organization: Not on file    Attends meetings of clubs or organizations: Not on file    Relationship status: Not on file  . Intimate partner violence:    Fear of current or ex partner: No    Emotionally abused: No    Physically abused: No  Forced sexual activity: No  Other Topics Concern  . Not on file  Social History Narrative  . Not on file    FAMILY HISTORY: Family History  Problem Relation Age of Onset  . Hypertension Mother   . Diabetes Mother   . Hypertension Father   . Stroke Father   . Diabetes Father   . Diabetes Sister   . Diabetes Sister     ALLERGIES:  is allergic to bee venom; keflex [cephalexin]; tramadol; gabapentin; heparin; hydrochlorothiazide; hydrocodone; oxycodone-acetaminophen; sorbitol; and tizanidine.  MEDICATIONS:  Current Outpatient Medications  Medication Sig Dispense Refill  . acetaminophen (TYLENOL) 325 MG tablet Take 2 tablets (650 mg total) by mouth every 4 (four) hours as needed for mild pain ((score 1 to 3) or temp > 100.5).    [name] Kitchen albuterol (PROVENTIL) (2.5 MG/3ML) 0.083% nebulizer solution Take 3 mLs (2.5 mg total) by nebulization every 6 (six) hours as needed for wheezing or shortness of breath. 75 mL 12  . Amino Acids-Protein Hydrolys (FEEDING SUPPLEMENT, PRO-STAT SUGAR FREE 64,) LIQD Take 30 mLs by mouth 3 (three) times daily between meals. 900 mL 0  . baclofen (LIORESAL) 10 MG tablet Take 2.5 tablets (25 mg total) by mouth 4 (four) times daily as needed for muscle spasms. 30 each 1  .  dextromethorphan (DELSYM) 30 MG/5ML liquid Take 5 mLs (30 mg total) by mouth 2 (two) times daily as needed for cough. 89 mL 0  . diphenhydrAMINE (BENADRYL) 25 MG tablet Take 12.5 mg by mouth every 8 (eight) hours as needed for itching.    . famotidine (PEPCID) 20 MG tablet Take 1 tablet (20 mg total) by mouth 2 (two) times daily. 60 tablet 0  . feeding supplement, ENSURE ENLIVE, (ENSURE ENLIVE) LIQD Take 237 mLs by mouth 3 (three) times daily between meals. (Patient not taking: Reported on 03/02/2018) 237 mL 12  . furosemide (LASIX) 40 MG tablet Take 1 tablet (40 mg total) by mouth every other day. 30 tablet   . ipratropium-albuterol (DUONEB) 0.5-2.5 (3) MG/3ML SOLN Take 3 mLs by nebulization 3 (three) times daily. 360 mL   . levothyroxine (SYNTHROID, LEVOTHROID) 150 MCG tablet Take 150 mcg by mouth daily before breakfast.     . LORazepam (ATIVAN) 1 MG tablet Take 1 tablet (1 mg total) by mouth as needed for anxiety (30 minutes prior to MRI and may repeat once if needed). 10 tablet 0  . Multiple Vitamin (MULTIVITAMIN WITH MINERALS) TABS tablet Take 1 tablet by mouth daily.    [name] Kitchen Nystatin (NYSTOP EX) Apply 1 application topically daily.    . ondansetron (ZOFRAN) 4 MG tablet Take 1 tablet by mouth every 6 (six) hours as needed for nausea or vomiting.     [name] Kitchen oxyCODONE (ROXICODONE) 5 MG immediate release tablet Take 1 tablet (5 mg total) by mouth every 6 (six) hours as needed for severe pain. 12 tablet 0  . pantoprazole (PROTONIX) 40 MG tablet Take 1 tablet by mouth daily.    . polyethylene glycol (MIRALAX / GLYCOLAX) packet Take 17 g by mouth daily. 14 each 0  . predniSONE (DELTASONE) 5 MG tablet Take 1 daily with breakfast x 5 days, then 0.5 tab daily x 5 days, then stop 5 tablet 0  . rivaroxaban (XARELTO) 20 MG TABS tablet Take 20 mg by mouth daily with supper.    . senna (SENOKOT) 8.6 MG TABS tablet Take 1 tablet (8.6 mg total) by mouth at bedtime as needed for mild constipation. 120 each 0  .  sertraline (ZOLOFT) 50 MG tablet      No current facility-administered medications for this visit.     REVIEW OF SYSTEMS:    A 10+ POINT REVIEW OF SYSTEMS WAS OBTAINED including neurology, dermatology, psychiatry, cardiac, respiratory, lymph, extremities, GI, GU, Musculoskeletal, constitutional, breasts, reproductive, HEENT.  All pertinent positives are noted in the HPI.  All others are negative.   PHYSICAL EXAMINATION:  ECOG PERFORMANCE STATUS: 3 - Symptomatic, >50% confined to bed  Vitals:   05/19/18 1008 05/19/18 1009  BP: (!) 76/47 (!) 76/45  Pulse: 66   Resp: 18   Temp: 97.7 F (36.5 C)   SpO2: 100%    There were no vitals filed for this visit. .Body mass index is 29.85 kg/m.   GENERAL:alert, in no acute distress and comfortable SKIN: no acute rashes, no significant lesions EYES: conjunctiva are pink and non-injected, sclera anicteric OROPHARYNX: MMM, no exudates, no oropharyngeal erythema or ulceration NECK: supple, no JVD LYMPH:  no palpable lymphadenopathy in the cervical, axillary or inguinal regions LUNGS: clear to auscultation b/l with normal respiratory effort HEART: regular rate & rhythm ABDOMEN:  normoactive bowel sounds , non tender, not distended. No palpable hepatosplenomegaly.  Extremity: 2+ pedal edema PSYCH: alert & oriented x 3 with fluent speech NEURO: no focal motor/sensory deficits   LABORATORY DATA:  I have reviewed the data as listed  . CBC Latest Ref Rng & Units 05/19/2018 04/08/2018 03/02/2018  WBC 4.0 - 10.5 K/uL 2.7(L) 3.2(L) 2.5(L)  Hemoglobin 12.0 - 15.0 g/dL 9.0(L) 9.7(L) 11.1(L)  Hematocrit 36.0 - 46.0 % 28.0(L) 29.6(L) 35.2(L)  Platelets 150 - 400 K/uL 119(L) 191 161   ANC 2.3k . CMP Latest Ref Rng & Units 05/19/2018 04/08/2018 03/02/2018  Glucose 70 - 99 mg/dL 106(H) 76 86  BUN 6 - 20 mg/dL 19 13 17   Creatinine 0.44 - 1.00 mg/dL 1.41(H) 1.24(H) 1.46(H)  Sodium 135 - 145 mmol/L 140 139 142  Potassium 3.5 - 5.1 mmol/L 3.9 4.1 4.1    Chloride 98 - 111 mmol/L 106 101 102  CO2 22 - 32 mmol/L 27 23 31   Calcium 8.9 - 10.3 mg/dL 8.0(L) 8.2(L) 8.3(L)  Total Protein 6.5 - 8.1 g/dL 6.0(L) 5.8(L) 6.5  Total Bilirubin 0.3 - 1.2 mg/dL 0.8 0.6 0.9  Alkaline Phos 38 - 126 U/L 161(H) 151(H) 644(H)  AST 15 - 41 U/L 14(L) 14 639(HH)  ALT 0 - 44 U/L <6 6 309(HH)       RADIOGRAPHIC STUDIES: I have personally reviewed the radiological images as listed and agreed with the findings in the report.  .Mr Jeri Cos Wo Contrast  Result Date: 05/18/2018 CLINICAL DATA:  Peripheral cell carcinoma. Osseous metastasis at T3. Abnormal CT of the head. EXAM: MRI HEAD WITHOUT AND WITH CONTRAST TECHNIQUE: Multiplanar, multiecho pulse sequences of the brain and surrounding structures were obtained without and with intravenous contrast. CONTRAST:  ML Gadavist COMPARISON:  CT head without contrast 02/13/2018 FINDINGS: Brain: No acute infarct, hemorrhage, or mass lesion is present. Mild periventricular and subcortical white matter changes are present bilaterally. The ventricles are of normal size. No significant extraaxial fluid collection is present. A lesion in the left parietal bone appears intraosseous. There is some dural thickening. The lesion measures 2.2 x 2.3 x 1.7 cm. There is progression of this lesion over time. Additional area of dural thickening is present more inferiorly adjacent to the left occipital lobe. This area measures 7 mm. This could represent an additional site of metastasis. No significant parenchymal  enhancement is present. No other dural-based lesions are evident. Vascular: Flow is present in the major intracranial arteries. Skull and upper cervical spine: The craniocervical junction is normal. Upper cervical spine is within normal limits. Marrow signal is unremarkable. Sinuses/Orbits: Mild mucosal thickening is present in maxillary sinuses bilaterally. There is minimal fluid in the mastoid air cells bilaterally. No obstructing  nasopharyngeal lesion is present. The paranasal sinuses and mastoid air cells are otherwise clear. The globes and orbits are within normal limits. IMPRESSION: 1. Progressive size of left parietal skull lesion concerning for additional site of metastatic Hurthle cell carcinoma. The lesion measures 2.2 x 2.3 x 1.7 cm. Underlying dural enhancement is likely reactive. A dural-based lesion such as meningioma with intraosseous extension is considered less likely. 2. Second site of focal dural thickening over the left occipital lobe. This is nonspecific. Small meningioma or cortical vessel could have this appearance. Additional sign metastasis is not excluded. 3. Bilateral white matter changes are mildly advanced for age. 4. Mild mucosal thickening in the maxillary sinuses bilaterally appears chronic. Electronically Signed   By: San Morelle M.D.   On: 05/18/2018 16:45   Nm Pet Image Restag (ps) Skull Base To Thigh  Result Date: 05/13/2018 CLINICAL DATA:  Subsequent treatment strategy for Hurthle cell thyroid carcinoma. EXAM: NUCLEAR MEDICINE PET SKULL BASE TO THIGH TECHNIQUE: 10.67 mCi F-18 FDG was injected intravenously. Full-ring PET imaging was performed from the skull base to thigh after the radiotracer. CT data was obtained and used for attenuation correction and anatomic localization. Fasting blood glucose: 77 mg/dl COMPARISON:  05/20/2017 FINDINGS: Mediastinal blood pool activity: SUV max 2.51 NECK: Hypermetabolic left supraclavicular lymph nodes again noted. Index lymph node measures 1.7 cm and has an SUV max of 22.14. Previously 1.1 cm within SUV max of 28.04. Incidental CT findings: none CHEST: The index hypermetabolic right paratracheal lymph node measures 1.4 cm within SUV max of 10.67. Previously 1.5 cm within SUV max of 17.8. Bilateral hypermetabolic pulmonary nodules are identified. There are also new bilateral pleural effusions, left greater than right. The previous index nodule in the medial  left base measures approximately 8 mm and has an SUV max of 3.03. Previously 5 mm within SUV max of 4.0. Nodule in the right middle lobe measures 0.8 cm and has an SUV max of 7.8. New from previous exam. Medial left lung apex nodule measures 9 mm with SUV max of 21.37. Previously 5 mm with SUV max of 10.74. slightly more posterior is a 7 mm nodule within SUV max of 10.5. Previously this nodule measured 4 mm with SUV max of 3.03. Within the medial left apex there is a 4 mm nodule with SUV max of 5.7. Previously this nodule measures 6 5 mm with SUV max of 4.37. Incidental CT findings: Coronary artery calcifications noted. Mild aortic atherosclerosis. ABDOMEN/PELVIS: No abnormal uptake within the liver, pancreas, spleen, adrenal glands. No hypermetabolic abdominal or pelvic lymph nodes. Incidental CT findings: Previous cholecystectomy. SKELETON: Persistent hypermetabolic tumor involving the T3 vertebra with associated postoperative changes from spinal decompression noted. SUV max associated with this lesion is equal to 28.76. Previously 50.7. Hypermetabolic lesion involving the right side of sternum measures 2.7 cm and has an SUV max of 48.28. Previously this lesion measured 0.9 cm within SUV max of 25.8. Incidental CT findings: Diffuse lower extremity edema is identified and appears new from previous exam. There has been interval hardware fixation of the left femur. IMPRESSION: 1. Multifocal bilateral hypermetabolic pulmonary nodules are again noted.  These appear increased in size when compared with the previous exam. Additionally, there is a new hypermetabolic nodule in the right middle lobe. 2. Hypermetabolic right paratracheal lymph node continues exhibits intense FDG uptake. This is not significantly changed in size but exhibits mild decrease in degree of FDG uptake. 3. Hypermetabolic left supraclavicular lymph nodes are again noted. These continue to exhibit similar degrees of intense uptake. The index left  supraclavicular lymph node is mildly increased in size in the interval. 4. Increase in size and degree of FDG uptake associated with hypermetabolic right sternal lesion. 5. Persistent intense FDG uptake associated with T3 lesion. The degree of FDG uptake appears mildly improved when compared with previous exam. 6. New bilateral pleural effusions. There is also new bilateral lower extremity edema. Electronically Signed   By: Kerby Moors M.D.   On: 05/13/2018 14:07     ASSESSMENT & PLAN:   53 y.o. 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  Patient has had treatment as noted above. h/o cord compression at T3 level due to recurrent tumor status post partial resection and debulking.   MRI on 08/14/2015 showed improved but persistent cord compression at the T3 level. She has been noted not to be a candidate for further repeat surgery at this time or repeat radiation. Recent PET/CT scan is noted of bowel done at [facility] on 08/07/2015 showed evidence of tumor progression and patient was started on lenvatinib as per Dr. Leamon Arnt. Her thyroglobulin level was 11.7 with anti-body of 9.8 on 08/07/2015 at [place]  PET/CT 11/01/2015: shows relatively stable though high burden of disease. Some concern of local recurrence in the thyroidectomy bed which is asymptomatic at this time. MRI thoracic spine 12/25/2015: No new abnormality since the prior examination. Status post T1-5 fusion for T3 decompression. Mass effect on the cord at the T3 level appears improved compared to the prior exam. Postoperative fluid collection likely representing a seroma seen on the prior study has slightly decreased in size.  PET/CT 06/03/2016 with Generally reduced hypermetabolic activity in the previously identified lesions, including the pulmonary nodules, dominant upper thoracic metastatic focus, and left lower cervical adenopathy.  Mildly reduced size of previous lesions as well. No new lesions.  PET/CT 12/12/2016: Concern for progression of metastatic Hurthle cell thyroid carcinoma with significant increase in metabolic activity of several lymph nodes, pulmonary nodules, and skeletal metastasis. Most convincing element of progression is a significant increase in metabolic activity (more than doubling). Mild increase in size of the lymph nodes.  PET/CT  12/15/24: Partial metabolic response. Left supraclavicular and right paratracheal nodal metastases, bilateral pulmonary metastases, and osseous metastases involving the upper thoracic spine and right sternum, with mild decrease in metabolism, as described above.   10/26/17 CT Thoracic Spine revealed Severe spinal stenosis at T3 from recurrent epidural tumor with complete effacement of the thecal sac and partial myelographic block. 2. Epidural tumor extends superiorly to T1 and inferiorly to T5 with milder spinal stenosis. 3. New nodular soft tissue dorsally in the canal at L1 and L2 which results in mild spinal stenosis and presumably reflects tumor as well, though it is unclear if this is completely extradural or partially intradural as well. 4. Unchanged appearance of T1-T5 posterior fusion, severe T3 pathologic fracture, and T2-T4 osseous tumor compared to the recent noncontrast CT.   Pt completed 50Gy in 5 fractions from 11/05/17 to 11/24/17 with Dr. [name]  02/15/18 CT C/A/P which revealed No evidence of active bleeding in the chest, abdomen, or pelvis. No retroperitoneal bleed. 2. Moderate bilateral pleural effusions. Multifocal ground-glass opacities throughout both lungs may be pulmonary edema or pneumonia. Mild septal thickening suggests an element of pulmonary edema. 3. Biapical consolidations with bronchiectasis may represent postradiation change, recommend correlation with treatment history. 4. Known osseous metastatic disease to the upper thoracic spine, grossly  unchanged from CT myelography 10/26/2017. 5. Recent cholecystectomy with small amount of residual complex fluid in the operative bed. This may represent postoperative seroma, there is no peripheral enhancement to suggest superimposed infection. Biloma is not entirely excluded, however lack of significant increase compared to CT 10 days ago argues against this, and there is no other perihepatic fluid. 6. Small amount of free fluid in the pelvis, slightly diminished from prior exam. 7. Confluent edema in the body wall/flanks, similar to prior.   02/13/18 CT Head revealed No acute intracranial process. 2. 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 Recommend MRI of the brain with and without contrast. 3. Borderline parenchymal brain volume loss for age.  PLAN:  -Discussed pt labwork today, 05/19/18; liver functions normalized. HGB slightly lower at 9.0. Other blood counts stable. -05/19/18 Thyroglobulin antibody is pending -Discussed the 05/13/18 PET/CT which revealed Multifocal bilateral hypermetabolic pulmonary nodules are again noted. These appear increased in size when compared with the previous exam. Additionally, there is a new hypermetabolic nodule in the right middle lobe. 2. Hypermetabolic right paratracheal lymph node continues exhibits intense FDG uptake. This is not significantly changed in size but exhibits mild decrease in degree of FDG uptake. 3. Hypermetabolic left supraclavicular lymph nodes are again noted. These continue to exhibit similar degrees of intense uptake. The index left supraclavicular lymph node is mildly increased in size in the interval. 4. Increase in size and degree of FDG uptake associated with hypermetabolic right sternal lesion. 5. Persistent intense FDG uptake associated with T3 lesion. The degree of FDG uptake appears mildly improved when compared with previous exam. 6. New bilateral pleural effusions. There is also new bilateral lower extremity  edema. 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 The lesion measures 2.2 x 2.3 x 1.7 cm. Underlying dural enhancement is likely reactive. A dural-based lesion such as meningioma with intraosseous extension is considered less likely. 2. Second site of focal dural thickening over the left occipital lobe. This is nonspecific. Small meningioma or cortical vessel could have this appearance. Additional sign metastasis is not excluded. 3. Bilateral white matter changes are mildly advanced for age. 4. Mild mucosal thickening in the maxillary sinuses bilaterally appears chronic -Discussed the decision of when to return the pt to Lenvatinib in the context of her overall health at this time and a slow moving malignancy, and am concerned that the Lenvatinib may not improve her quality of life and can increase her risk of infections, which she has had. The pt agrees with this, and we will hold Lenvatinib for now and will reassess at our next visit. -Recommend following up with PCP this week for possible ear infection, adjusting Lasix, and tracking BP with decreased spinal tone and Baclofen use: BP today at 76/45. Optimizing nutritional status -Pt is having some pain at site of left parietal vs left mastoid. Will continue to monitor -Will suggest radiation of the new parietal calvarial metastasis becomes if this becomes symptomatic, will otherwise continue watchful  observation -Recommend wearing OTC graded sports compression socks -Continue Xarelto indefinitely -Will see the pt back in 2 months  2) s/p Grade 1 oral mucositis related to Lenvatinib -resolved  3) FDG avidity in the rectum - much improved. No significant concerns on last PET  4) abnormal LFTs likely from gallstones - last HIDA - Patent cystic duct without evidence for acute cholecystitis. 2. Normal gallbladder ejection  fraction.  5) history of right-sided pulmonary embolism on chronic anticoagulation with Rivaroxaban. Monitor for bleeding. -continue Xarelto   RTC with Dr Irene Limbo with labs in 2 months   All of the patients questions were answered with apparent satisfaction. The patient knows to call the clinic with any problems, questions or concerns.   The total time spent in the appt was 35 minutes and more than 50% was on counseling and direct patient cares.     Sullivan Lone MD [employer] A[employer]IV[employer] [facility] Hematology/Oncology Physician [facility]  (Office):       [phone] (Work cell):  [phone] (Fax):           [phone]  I, Baldwin Jamaica, am acting as a scribe for Dr. Sullivan Lone.   .I have reviewed the above documentation for accuracy and completeness, and I agree with the above.  Brunetta Genera MD

## 2018-05-19 ENCOUNTER — Inpatient Hospital Stay: Payer: Medicare Other

## 2018-05-19 ENCOUNTER — Telehealth: Payer: Self-pay | Admitting: Hematology

## 2018-05-19 ENCOUNTER — Inpatient Hospital Stay: Payer: Medicare Other | Attending: Hematology | Admitting: Hematology

## 2018-05-19 ENCOUNTER — Telehealth: Payer: Self-pay | Admitting: Urology

## 2018-05-19 VITALS — BP 76/45 | HR 66 | Temp 97.7°F | Resp 18 | Ht 71.0 in

## 2018-05-19 DIAGNOSIS — M545 Low back pain: Secondary | ICD-10-CM | POA: Insufficient documentation

## 2018-05-19 DIAGNOSIS — C7801 Secondary malignant neoplasm of right lung: Secondary | ICD-10-CM | POA: Diagnosis not present

## 2018-05-19 DIAGNOSIS — C778 Secondary and unspecified malignant neoplasm of lymph nodes of multiple regions: Secondary | ICD-10-CM | POA: Insufficient documentation

## 2018-05-19 DIAGNOSIS — C7802 Secondary malignant neoplasm of left lung: Secondary | ICD-10-CM | POA: Insufficient documentation

## 2018-05-19 DIAGNOSIS — R109 Unspecified abdominal pain: Secondary | ICD-10-CM | POA: Diagnosis not present

## 2018-05-19 DIAGNOSIS — M7989 Other specified soft tissue disorders: Secondary | ICD-10-CM | POA: Diagnosis not present

## 2018-05-19 DIAGNOSIS — C73 Malignant neoplasm of thyroid gland: Secondary | ICD-10-CM

## 2018-05-19 DIAGNOSIS — Z86711 Personal history of pulmonary embolism: Secondary | ICD-10-CM | POA: Diagnosis not present

## 2018-05-19 DIAGNOSIS — C7951 Secondary malignant neoplasm of bone: Secondary | ICD-10-CM | POA: Insufficient documentation

## 2018-05-19 DIAGNOSIS — C7931 Secondary malignant neoplasm of brain: Secondary | ICD-10-CM | POA: Diagnosis not present

## 2018-05-19 LAB — CBC WITH DIFFERENTIAL/PLATELET
ABS IMMATURE GRANULOCYTES: 0.04 10*3/uL (ref 0.00–0.07)
BASOS ABS: 0 10*3/uL (ref 0.0–0.1)
Basophils Relative: 0 %
Eosinophils Absolute: 0.1 10*3/uL (ref 0.0–0.5)
Eosinophils Relative: 3 %
HCT: 28 % — ABNORMAL LOW (ref 36.0–46.0)
Hemoglobin: 9 g/dL — ABNORMAL LOW (ref 12.0–15.0)
Immature Granulocytes: 2 %
Lymphocytes Relative: 19 %
Lymphs Abs: 0.5 10*3/uL — ABNORMAL LOW (ref 0.7–4.0)
MCH: 32.7 pg (ref 26.0–34.0)
MCHC: 32.1 g/dL (ref 30.0–36.0)
MCV: 101.8 fL — ABNORMAL HIGH (ref 80.0–100.0)
Monocytes Absolute: 0.2 10*3/uL (ref 0.1–1.0)
Monocytes Relative: 6 %
NEUTROS ABS: 1.9 10*3/uL (ref 1.7–7.7)
Neutrophils Relative %: 70 %
Platelets: 119 10*3/uL — ABNORMAL LOW (ref 150–400)
RBC: 2.75 MIL/uL — ABNORMAL LOW (ref 3.87–5.11)
RDW: 16.6 % — ABNORMAL HIGH (ref 11.5–15.5)
WBC: 2.7 10*3/uL — ABNORMAL LOW (ref 4.0–10.5)
nRBC: 0 % (ref 0.0–0.2)

## 2018-05-19 LAB — CMP (CANCER CENTER ONLY)
ALK PHOS: 161 U/L — AB (ref 38–126)
ALT: <6 U/L (ref 0–44)
ANION GAP: 7 (ref 5–15)
AST: 14 U/L — ABNORMAL LOW (ref 15–41)
Albumin: 2.6 g/dL — ABNORMAL LOW (ref 3.5–5.0)
BUN: 19 mg/dL (ref 6–20)
CALCIUM: 8 mg/dL — AB (ref 8.9–10.3)
CO2: 27 mmol/L (ref 22–32)
Chloride: 106 mmol/L (ref 98–111)
Creatinine: 1.41 mg/dL — ABNORMAL HIGH (ref 0.44–1.00)
GFR, Est AFR Am: 50 mL/min — ABNORMAL LOW (ref >60)
GFR, Estimated: 43 mL/min — ABNORMAL LOW (ref >60)
Glucose, Bld: 106 mg/dL — ABNORMAL HIGH (ref 70–99)
Potassium: 3.9 mmol/L (ref 3.5–5.1)
Sodium: 140 mmol/L (ref 135–145)
Total Bilirubin: 0.8 mg/dL (ref 0.3–1.2)
Total Protein: 6 g/dL — ABNORMAL LOW (ref 6.5–8.1)

## 2018-05-19 NOTE — Telephone Encounter (Signed)
I left a message on the patient's voicemail requesting that she return my call to discuss the recommendations for treatment of the progressive left parietal calvarial lesion on recent MRI brain.  The consensus recommendation from multidisciplinary conference was to proceed with surgical resection followed by postop radiotherapy to this lesion as well as treatment of an additional 7 mm lesion that is more inferior and adjacent to the left occipital lobe.  Dr. Vertell Limber was present at the conference this morning and is in agreement with this plan and willing to offer treatment.  I will wait to hear back from the patient prior to moving forward with coordinating a consult visit with Dr. Vertell Limber in the near future to discuss surgical resection.  Nicholos Johns, MMS, PA-C [facility] [place]: [phone]  Fax: [phone]

## 2018-05-19 NOTE — Telephone Encounter (Signed)
Gave avs and calendar ° °

## 2018-05-20 ENCOUNTER — Telehealth: Payer: Self-pay | Admitting: *Deleted

## 2018-05-20 DIAGNOSIS — C7951 Secondary malignant neoplasm of bone: Secondary | ICD-10-CM | POA: Diagnosis not present

## 2018-05-20 DIAGNOSIS — G8929 Other chronic pain: Secondary | ICD-10-CM | POA: Diagnosis not present

## 2018-05-20 DIAGNOSIS — C73 Malignant neoplasm of thyroid gland: Secondary | ICD-10-CM | POA: Diagnosis not present

## 2018-05-20 DIAGNOSIS — M1990 Unspecified osteoarthritis, unspecified site: Secondary | ICD-10-CM | POA: Diagnosis not present

## 2018-05-20 DIAGNOSIS — G8220 Paraplegia, unspecified: Secondary | ICD-10-CM | POA: Diagnosis not present

## 2018-05-20 DIAGNOSIS — C7931 Secondary malignant neoplasm of brain: Secondary | ICD-10-CM | POA: Diagnosis not present

## 2018-05-20 DIAGNOSIS — I10 Essential (primary) hypertension: Secondary | ICD-10-CM | POA: Diagnosis not present

## 2018-05-20 DIAGNOSIS — M519 Unspecified thoracic, thoracolumbar and lumbosacral intervertebral disc disorder: Secondary | ICD-10-CM | POA: Diagnosis not present

## 2018-05-20 DIAGNOSIS — M549 Dorsalgia, unspecified: Secondary | ICD-10-CM | POA: Diagnosis not present

## 2018-05-20 DIAGNOSIS — Z86711 Personal history of pulmonary embolism: Secondary | ICD-10-CM | POA: Diagnosis not present

## 2018-05-20 DIAGNOSIS — E890 Postprocedural hypothyroidism: Secondary | ICD-10-CM | POA: Diagnosis not present

## 2018-05-20 LAB — THYROGLOBULIN ANTIBODY: Thyroglobulin Antibody: <1 IU/mL (ref 0.0–0.9)

## 2018-05-20 NOTE — Telephone Encounter (Signed)
Pt nurse called to report a BP of 75/52.  Pt is c/o of dizziness when changing position.  Spoke with Dr. Andy Gauss who took phone call. He suggest that she go to the ED tonight because she could possible need fluids.  Pt is agreeable to plan. [name], [name], CMA

## 2018-05-21 ENCOUNTER — Encounter (HOSPITAL_COMMUNITY): Payer: Self-pay | Admitting: Emergency Medicine

## 2018-05-21 ENCOUNTER — Emergency Department (HOSPITAL_COMMUNITY)
Admission: EM | Admit: 2018-05-21 | Discharge: 2018-05-21 | Disposition: A | Discharge status: Home / Self Care | Payer: Medicare Other | Attending: Emergency Medicine | Admitting: Emergency Medicine

## 2018-05-21 ENCOUNTER — Ambulatory Visit: Payer: Medicare Other | Admitting: Family Medicine

## 2018-05-21 ENCOUNTER — Other Ambulatory Visit: Payer: Self-pay

## 2018-05-21 DIAGNOSIS — Z7901 Long term (current) use of anticoagulants: Secondary | ICD-10-CM | POA: Insufficient documentation

## 2018-05-21 DIAGNOSIS — E119 Type 2 diabetes mellitus without complications: Secondary | ICD-10-CM | POA: Insufficient documentation

## 2018-05-21 DIAGNOSIS — J0100 Acute maxillary sinusitis, unspecified: Secondary | ICD-10-CM

## 2018-05-21 DIAGNOSIS — C73 Malignant neoplasm of thyroid gland: Secondary | ICD-10-CM | POA: Diagnosis not present

## 2018-05-21 DIAGNOSIS — R51 Headache: Secondary | ICD-10-CM | POA: Diagnosis present

## 2018-05-21 DIAGNOSIS — I10 Essential (primary) hypertension: Secondary | ICD-10-CM | POA: Diagnosis not present

## 2018-05-21 DIAGNOSIS — E039 Hypothyroidism, unspecified: Secondary | ICD-10-CM | POA: Diagnosis not present

## 2018-05-21 DIAGNOSIS — C7951 Secondary malignant neoplasm of bone: Secondary | ICD-10-CM | POA: Diagnosis not present

## 2018-05-21 DIAGNOSIS — Z79899 Other long term (current) drug therapy: Secondary | ICD-10-CM | POA: Diagnosis not present

## 2018-05-21 LAB — CBC WITH DIFFERENTIAL/PLATELET
Abs Immature Granulocytes: 0.06 10*3/uL (ref 0.00–0.07)
Basophils Absolute: 0 10*3/uL (ref 0.0–0.1)
Basophils Relative: 0 %
Eosinophils Absolute: 0.1 10*3/uL (ref 0.0–0.5)
Eosinophils Relative: 2 %
HCT: 27.8 % — ABNORMAL LOW (ref 36.0–46.0)
Hemoglobin: 8.5 g/dL — ABNORMAL LOW (ref 12.0–15.0)
IMMATURE GRANULOCYTES: 2 %
Lymphocytes Relative: 24 %
Lymphs Abs: 0.9 10*3/uL (ref 0.7–4.0)
MCH: 31.5 pg (ref 26.0–34.0)
MCHC: 30.6 g/dL (ref 30.0–36.0)
MCV: 103 fL — ABNORMAL HIGH (ref 80.0–100.0)
Monocytes Absolute: 0.3 10*3/uL (ref 0.1–1.0)
Monocytes Relative: 8 %
Neutro Abs: 2.4 10*3/uL (ref 1.7–7.7)
Neutrophils Relative %: 64 %
Platelets: 141 10*3/uL — ABNORMAL LOW (ref 150–400)
RBC: 2.7 MIL/uL — AB (ref 3.87–5.11)
RDW: 16.9 % — ABNORMAL HIGH (ref 11.5–15.5)
WBC: 3.7 10*3/uL — AB (ref 4.0–10.5)
nRBC: 0 % (ref 0.0–0.2)

## 2018-05-21 LAB — BASIC METABOLIC PANEL
ANION GAP: 5 (ref 5–15)
BUN: 23 mg/dL — ABNORMAL HIGH (ref 6–20)
CO2: 28 mmol/L (ref 22–32)
Calcium: 7.9 mg/dL — ABNORMAL LOW (ref 8.9–10.3)
Chloride: 108 mmol/L (ref 98–111)
Creatinine, Ser: 1.39 mg/dL — ABNORMAL HIGH (ref 0.44–1.00)
GFR calc Af Amer: 50 mL/min — ABNORMAL LOW (ref >60)
GFR calc non Af Amer: 43 mL/min — ABNORMAL LOW (ref >60)
Glucose, Bld: 91 mg/dL (ref 70–99)
Potassium: 4 mmol/L (ref 3.5–5.1)
Sodium: 141 mmol/L (ref 135–145)

## 2018-05-21 MED ORDER — AMOXICILLIN-POT CLAVULANATE 875-125 MG PO TABS
1.0000 | ORAL_TABLET | Freq: Once | ORAL | Status: AC
  Administered 2018-05-21: 1 via ORAL
  Filled 2018-05-21: qty 1

## 2018-05-21 MED ORDER — AMOXICILLIN-POT CLAVULANATE 875-125 MG PO TABS: 1.0000 | ORAL_TABLET | Freq: Two times a day (BID) | ORAL | 0 refills | Status: DC

## 2018-05-21 NOTE — ED Provider Notes (Signed)
[facility] EMERGENCY DEPARTMENT Provider Note   CSN: [id number] Arrival date & time: 05/21/18  1217    History   Chief Complaint Chief Complaint  Patient presents with  . Hypotension    HPI Sarah [name] is a 53 y.o. female.   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  Apparently her blood pressure was 75/49 recently.  Normally her systolic is 90.  Today it was 105/76.  Recent MRI of brain on 05/18/1988 reveals a progressive increase in the left parietal skull lesion consistent with metastatic Hurthle cell carcinoma.  Her primary concern is a bilateral maxillary sinus infection.  She is complaining of pain in his general facial area and a discharge.  No fever, sweats, chills, chest pain, dyspnea, dysuria.  Severity is mild. HPI  Past Medical History:  Diagnosis Date  . Cancer [facility])    FNA of thyroid positive for onconytic/hurthle cell carcinoma  . Chronic back pain   . Complication of anesthesia    Nausea and vomiting  . DDD (degenerative disc disease), cervical   . DJD (degenerative joint disease)   . Family history of adverse reaction to anesthesia    MOTHER HAS NAUSEA   . GERD (gastroesophageal reflux disease)   . History of rectal fissure   . HIT (heparin-induced thrombocytopenia) ([place])   . Hypertension   . Hypothyroidism   . Obesity   . 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 Pulmonary embolus, right ([place]) 2015  . Rotator cuff tendonitis right    Patient Active Problem List   Diagnosis Date Noted  . Transaminitis 04/09/2018  . Hypoalbuminemia 04/09/2018  . Dyspnea   . Multifocal pneumonia   . Palliative care by specialist   . Goals of care, counseling/discussion   . DNR (do not resuscitate) discussion   . Fecal impaction ([place]) 02/06/2018  . HCAP (healthcare-associated pneumonia) 02/06/2018  . Anemia   . Chronic anticoagulation   . Pressure injury of skin 02/04/2018  . Fracture   . Acute lower UTI   . Myelopathy ([place]) 09/21/2017  . Muscle  spasms of both lower extremities   . Closed fracture of shaft of left femur ([place])   . Acute blood loss anemia   . Hypothyroidism   . Nausea & vomiting 09/15/2017  . Closed fracture of left distal femur ([place]) 09/11/2017  . Paraplegia ([place]) 09/08/2017  . Obesity (BMI 30-39.9) 09/03/2017  . Status post surgery 09/03/2017  . Calculus of gallbladder without cholecystitis without obstruction   . Biliary dyskinesia   . Intractable nausea and vomiting 08/09/2017  . Thrombocytopenia ([place]) 08/09/2017  . Elevated bilirubin 08/09/2017  . AKI (acute kidney injury) ([place]) 08/09/2017  . Breast pain 01/26/2017  . Upper respiratory infection, viral 01/26/2017  . Counseling regarding advanced care planning and goals of care 01/02/2017  . Bilateral rotator cuff syndrome 02/12/2016  . E. coli UTI 08/01/2015  . Constipation due to neurogenic bowel 08/01/2015  . 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 Spastic paraparesis   . Neurogenic bladder   . History of pulmonary embolism   . Post-operative pain   . Metastatic cancer ([place])   . Steroid-induced hyperglycemia   . Depression   . Obstipation   . Thoracic spine tumor 07/06/2015  . 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 ([place]) 07/06/2015  . Spinal cord compression due to malignant neoplasm metastatic to spine ([place])   . Postoperative hypothyroidism   . 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 Spasticity 09/19/2014  .  Hypertension 05/01/2014  . Ingrown right big toenail 05/01/2014  . GERD (gastroesophageal reflux disease) 02/14/2014  . Dysphagia, pharyngoesophageal phase 02/06/2014  . Type 2 diabetes mellitus without complication ([place]) 16/09/3708  . Constipation   . Neurogenic bowel   . 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 Metastatic cancer to spine ([place]) 01/19/2014  . Rotator cuff tendonitis   . 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 Leg weakness,  bilateral 12/13/2013    Past Surgical History:  Procedure Laterality Date  . CHOLECYSTECTOMY N/A 01/25/2018   Procedure: LAPAROSCOPIC CHOLECYSTECTOMY;  Surgeon: Virl Cagey, MD;  Location: [facility];  Service: General;  Laterality: N/A;  . FEMUR IM NAIL Left 09/11/2017   Procedure: INTRAMEDULLARY (IM) RETROGRADE FEMORAL NAILING;  Surgeon: Altamese Roosevelt, MD;  Location: [place];  Service: Orthopedics;  Laterality: Left;  . LAMINECTOMY N/A 12/14/2013   Procedure: THORACIC LAMINECTOMY FOR TUMOR THORACIC THREE;  Surgeon: Ashok Pall, MD;  Location: [place] [facility];  Service: Neurosurgery;  Laterality: N/A;  THORACIC LAMINECTOMY FOR TUMOR THORACIC THREE  . LAMINECTOMY N/A 07/05/2015   Procedure: THORACIC LAMINECTOMY FOR TUMOR;  Surgeon: Ashok Pall, MD;  Location: [place] [facility];  Service: Neurosurgery;  Laterality: N/A;  . LAMINECTOMY N/A 09/03/2017   Procedure: POSTERIOR SPINAL TUMOR RESECTION THORACIC THREE;  Surgeon: Ashok Pall, MD;  Location: [place];  Service: Neurosurgery;  Laterality: N/A;  POSTERIOR SPINAL TUMOR RESECTION THORACIC THREE  . POSTERIOR LUMBAR FUSION 4 LEVEL N/A 12/30/2013   Procedure: Thoracic one-Thoracic five posterior thoracic fusion with pedicle screws;  Surgeon: Ashok Pall, MD;  Location: [place] [facility];  Service: Neurosurgery;  Laterality: N/A;  Thoracic one-Thoracic five posterior thoracic fusion with pedicle screws  . THYROIDECTOMY N/A 01/09/2014   Procedure: TOTAL THYROIDECTOMY;  Surgeon: Izora Gala, MD;  Location: [place];  Service: ENT;  Laterality: N/A;  . TONSILLECTOMY       OB History    Gravida      Para      Term      Preterm      AB      Living  0     SAB      TAB      Ectopic      Multiple      Live Births               Home Medications    Prior to Admission medications   Medication Sig Start Date End Date Taking? Authorizing Provider  acetaminophen (TYLENOL) 325 MG tablet Take 2 tablets (650 mg total) by mouth every 4 (four) hours  as needed for mild pain ((score 1 to 3) or temp > 100.5). 09/11/17   Angiulli, Lavon Paganini, PA-C  albuterol (PROVENTIL) (2.5 MG/3ML) 0.083% nebulizer solution Take 3 mLs (2.5 mg total) by nebulization every 6 (six) hours as needed for wheezing or shortness of breath. 02/23/18   Johnson, Clanford L, MD  Amino Acids-Protein Hydrolys (FEEDING SUPPLEMENT, PRO-STAT SUGAR FREE 64,) LIQD Take 30 mLs by mouth 3 (three) times daily between meals. 02/23/18   Johnson, Clanford L, MD  amoxicillin-clavulanate (AUGMENTIN) 875-125 MG tablet Take 1 tablet by mouth every 12 (twelve) hours. 05/21/18   Nat Christen, MD  baclofen (LIORESAL) 10 MG tablet Take 2.5 tablets (25 mg total) by mouth 4 (four) times daily as needed for muscle spasms. 04/29/18   Diallo, Earna Coder, MD  dextromethorphan (DELSYM) 30 MG/5ML liquid Take 5 mLs (30  mg total) by mouth 2 (two) times daily as needed for cough. 02/23/18   Johnson, Clanford L, MD  diphenhydrAMINE (BENADRYL) 25 MG tablet Take 12.5 mg by mouth every 8 (eight) hours as needed for itching.    [provider]  famotidine (PEPCID) 20 MG tablet Take 1 tablet (20 mg total) by mouth 2 (two) times daily. 09/21/17   Roxan Hockey, MD  feeding supplement, ENSURE ENLIVE, (ENSURE ENLIVE) LIQD Take 237 mLs by mouth 3 (three) times daily between meals. Patient not taking: Reported on 03/02/2018 02/23/18   Murlean Iba, MD  furosemide (LASIX) 40 MG tablet Take 1 tablet (40 mg total) by mouth every other day. 02/25/18   Johnson, Clanford L, MD  ipratropium-albuterol (DUONEB) 0.5-2.5 (3) MG/3ML SOLN Take 3 mLs by nebulization 3 (three) times daily. 02/23/18   Murlean Iba, MD  levothyroxine (SYNTHROID, LEVOTHROID) 150 MCG tablet Take 150 mcg by mouth daily before breakfast.  01/06/18   [provider]  LORazepam (ATIVAN) 1 MG tablet Take 1 tablet (1 mg total) by mouth as needed for anxiety (30 minutes prior to MRI and may repeat once if needed). 05/18/18   Bruning, Ashlyn, PA-C    Multiple Vitamin (MULTIVITAMIN WITH MINERALS) TABS tablet Take 1 tablet by mouth daily. 02/24/18   Johnson, Clanford L, MD  Nystatin (NYSTOP EX) Apply 1 application topically daily.    [provider]  ondansetron (ZOFRAN) 4 MG tablet Take 1 tablet by mouth every 6 (six) hours as needed for nausea or vomiting.  07/25/14   [provider]  oxyCODONE (ROXICODONE) 5 MG immediate release tablet Take 1 tablet (5 mg total) by mouth every 6 (six) hours as needed for severe pain. 02/23/18 02/23/19  Johnson, Clanford L, MD  pantoprazole (PROTONIX) 40 MG tablet Take 1 tablet by mouth daily. 01/17/15   [provider]  polyethylene glycol (MIRALAX / GLYCOLAX) packet Take 17 g by mouth daily. 10/30/17   Angiulli, Lavon Paganini, PA-C  predniSONE (DELTASONE) 5 MG tablet Take 1 daily with breakfast x 5 days, then 0.5 tab daily x 5 days, then stop 02/24/18   Irwin Brakeman L, MD  rivaroxaban (XARELTO) 20 MG TABS tablet Take 20 mg by mouth daily with supper.    [provider]  senna (SENOKOT) 8.6 MG TABS tablet Take 1 tablet (8.6 mg total) by mouth at bedtime as needed for mild constipation. 02/23/18   Murlean Iba, MD  sertraline (ZOLOFT) 50 MG tablet  12/24/17   [provider]    Family History Family History  Problem Relation Age of Onset  . Hypertension Mother   . Diabetes Mother   . Hypertension Father   . Stroke Father   . Diabetes Father   . Diabetes Sister   . Diabetes Sister     Social History Social History   Tobacco Use  . Smoking status: Never Smoker  . Smokeless tobacco: Never Used  Substance Use Topics  . Alcohol use: No  . Drug use: No     Allergies   Bee venom; Keflex [cephalexin]; Tramadol; Gabapentin; Heparin; Hydrochlorothiazide; Hydrocodone; Oxycodone-acetaminophen; Sorbitol; and Tizanidine   Review of Systems Review of Systems  All other systems reviewed and are negative.    Physical Exam Updated Vital Signs BP 115/79    Pulse 64   Temp 97.7 F (36.5 C) (Oral)   Resp 16   Ht 5\' 11"  (1.803 m)   Wt 97.1 kg   LMP 10/22/2016 (LMP Unknown)  SpO2 100%   BMI 29.85 kg/m   Physical Exam Vitals signs and nursing note reviewed.  Constitutional:      Appearance: She is well-developed.  HENT:     Head: Normocephalic and atraumatic.     Comments: Tender puffy maxillary sinuses bilaterally Eyes:     Conjunctiva/sclera: Conjunctivae normal.  Neck:     Musculoskeletal: Neck supple.  Cardiovascular:     Rate and Rhythm: Normal rate and regular rhythm.  Pulmonary:     Effort: Pulmonary effort is normal.     Breath sounds: Normal breath sounds.  Abdominal:     General: Bowel sounds are normal.     Palpations: Abdomen is soft.  Musculoskeletal: Normal range of motion.  Skin:    General: Skin is warm and dry.  Neurological:     Mental Status: She is alert and oriented to person, place, and time.  Psychiatric:        Behavior: Behavior normal.      ED Treatments / Results  Labs (all labs ordered are listed, but only abnormal results are displayed) Labs Reviewed  CBC WITH DIFFERENTIAL/PLATELET - Abnormal; Notable for the following components:      Result Value   WBC 3.7 (*)    RBC 2.70 (*)    Hemoglobin 8.5 (*)    HCT 27.8 (*)    MCV 103.0 (*)    RDW 16.9 (*)    Platelets 141 (*)    All other components within normal limits  BASIC METABOLIC PANEL - Abnormal; Notable for the following components:   BUN 23 (*)    Creatinine, Ser 1.39 (*)    Calcium 7.9 (*)    GFR calc non Af Amer 43 (*)    GFR calc Af Amer 50 (*)    All other components within normal limits    EKG None  Radiology No results found.  Procedures Procedures (including critical care time)  Medications Ordered in ED Medications  amoxicillin-clavulanate (AUGMENTIN) 875-125 MG per tablet 1 tablet (1 tablet Oral Given 05/21/18 1740)     Initial Impression / Assessment and Plan / ED Course  I have reviewed the triage vital  signs and the nursing notes.  Pertinent labs & imaging results that were available during my care of the patient were reviewed by me and considered in my medical decision making (see chart for details).        Patient tends to run low blood pressure.  BP in ER was 115/79.  She was most concerned about her maxillary sinusitis.  Rx Augmentin 875/125 twice daily.  She has primary care follow-up next week.  Final Clinical Impressions(s) / ED Diagnoses   Final diagnoses:  Acute maxillary sinusitis, recurrence not specified  Hurthle cell carcinoma [facility])    ED Discharge Orders         Ordered    amoxicillin-clavulanate (AUGMENTIN) 875-125 MG tablet  Every 12 hours     05/21/18 1727           Nat Christen, MD 05/21/18 [phone]

## 2018-05-21 NOTE — Discharge Instructions (Addendum)
Prescription for Augmentin which is a antibiotic for sinusitis.  Follow-up with your primary care doctor.

## 2018-05-21 NOTE — ED Triage Notes (Signed)
Patient referred to ED for hypotension, systolic 70 at home. Patient reports 3 day history. Had MRI which showed sinus infection.

## 2018-05-26 ENCOUNTER — Other Ambulatory Visit: Payer: Self-pay | Admitting: Neurosurgery

## 2018-05-26 ENCOUNTER — Other Ambulatory Visit: Payer: Self-pay | Admitting: Radiation Therapy

## 2018-05-26 DIAGNOSIS — C7951 Secondary malignant neoplasm of bone: Secondary | ICD-10-CM | POA: Diagnosis not present

## 2018-05-26 DIAGNOSIS — C73 Malignant neoplasm of thyroid gland: Secondary | ICD-10-CM | POA: Diagnosis not present

## 2018-05-26 DIAGNOSIS — C7931 Secondary malignant neoplasm of brain: Secondary | ICD-10-CM | POA: Diagnosis not present

## 2018-05-26 DIAGNOSIS — C801 Malignant (primary) neoplasm, unspecified: Secondary | ICD-10-CM | POA: Diagnosis not present

## 2018-05-26 NOTE — Pre-Procedure Instructions (Signed)
Sarah [name]  05/26/2018      [place] [place] [address] Phone: [phone] Fax: [phone]  [place], [place] [address] Phone: [phone] Fax: [phone]    Your procedure is scheduled on Friday, March 6th.  Report to [facility] Admitting Entrance "A" at 10:15 A.M.  Call this number if you have problems the morning of surgery:  [phone]   Remember:  Do not eat or drink after midnight.    Take these medicines the morning of surgery with A SIP OF WATER  amoxicillin-clavulanate (AUGMENTIN)  baclofen (LIORESAL) levothyroxine (SYNTHROID, LEVOTHROID)  pantoprazole (PROTONIX)  sertraline (ZOLOFT)   As needed: ondansetron (ZOFRAN), acetaminophen (TYLENOL), nebulizer treatments.   Follow your surgeon's instructions on when to stop/resume rivaroxaban (XARELTO).  If no instructions were given by your surgeon then you will need to call the office to get those instructions.    As of today, STOP taking any Aspirin (unless otherwise instructed by your surgeon), Aleve, Naproxen, Ibuprofen, Motrin, Advil, Goody's, BC's, all herbal medications, fish oil, and all vitamins.    Do not wear jewelry, make-up or nail polish.  Do not wear lotions, powders, or perfumes, or deodorant.  Do not shave 48 hours prior to surgery.  Do not bring valuables to the hospital.  [facility] is not responsible for any belongings or valuables.  Contacts, dentures or bridgework may not be worn into surgery.  Leave your suitcase in the car.  After surgery it may be brought to your room.  For patients admitted to the hospital, discharge time will be determined by your treatment team.  Patients discharged the day of surgery will not be allowed to drive home.   Special instructions:   [place]- Preparing For Surgery  Before  surgery, you can play an important role. Because skin is not sterile, your skin needs to be as free of germs as possible. You can reduce the number of germs on your skin by washing with CHG (chlorahexidine gluconate) Soap before surgery.  CHG is an antiseptic cleaner which kills germs and bonds with the skin to continue killing germs even after washing.    Oral Hygiene is also important to reduce your risk of infection.  Remember - BRUSH YOUR TEETH THE MORNING OF SURGERY WITH YOUR REGULAR TOOTHPASTE  Please do not use if you have an allergy to CHG or antibacterial soaps. If your skin becomes reddened/irritated stop using the CHG.  Do not shave (including legs and underarms) for at least 48 hours prior to first CHG shower. It is OK to shave your face.  Please follow these instructions carefully.   1. Shower the NIGHT BEFORE SURGERY and the MORNING OF SURGERY with CHG.   2. If you chose to wash your hair, wash your hair first as usual with your normal shampoo.  3. After you shampoo, rinse your hair and body thoroughly to remove the shampoo.  4. Use CHG as you would any other liquid soap. You can apply CHG directly to the skin and wash gently with a scrungie or a clean washcloth.   5. Apply the CHG Soap to your body ONLY FROM THE NECK DOWN.  Do not use on open wounds or open sores. Avoid contact with your eyes, ears, mouth and genitals (private parts). Wash Face and genitals (private parts)  with  your normal soap.  6. Wash thoroughly, paying special attention to the area where your surgery will be performed.  7. Thoroughly rinse your body with warm water from the neck down.  8. DO NOT shower/wash with your normal soap after using and rinsing off the CHG Soap.  9. Pat yourself dry with a CLEAN TOWEL.  10. Wear CLEAN PAJAMAS to bed the night before surgery, wear comfortable clothes the morning of surgery  11. Place CLEAN SHEETS on your bed the night of your first shower and DO NOT SLEEP WITH  PETS.    Day of Surgery:  Do not apply any deodorants/lotions.  Please wear clean clothes to the hospital/surgery center.   Remember to brush your teeth WITH YOUR REGULAR TOOTHPASTE.   Please read over the following fact sheets that you were given.

## 2018-05-27 ENCOUNTER — Encounter (HOSPITAL_COMMUNITY)
Admission: RE | Admit: 2018-05-27 | Discharge: 2018-05-27 | Disposition: A | Discharge status: Home / Self Care | Payer: Medicare Other | Source: Ambulatory Visit | Attending: Neurosurgery | Admitting: Neurosurgery

## 2018-05-27 ENCOUNTER — Ambulatory Visit: Payer: Medicare Other | Admitting: Family Medicine

## 2018-05-27 ENCOUNTER — Other Ambulatory Visit: Payer: Self-pay

## 2018-05-27 ENCOUNTER — Encounter (HOSPITAL_COMMUNITY): Payer: Self-pay

## 2018-05-27 DIAGNOSIS — Z7901 Long term (current) use of anticoagulants: Secondary | ICD-10-CM | POA: Diagnosis not present

## 2018-05-27 DIAGNOSIS — F418 Other specified anxiety disorders: Secondary | ICD-10-CM | POA: Diagnosis present

## 2018-05-27 DIAGNOSIS — Z885 Allergy status to narcotic agent status: Secondary | ICD-10-CM | POA: Diagnosis not present

## 2018-05-27 DIAGNOSIS — C7931 Secondary malignant neoplasm of brain: Secondary | ICD-10-CM | POA: Diagnosis not present

## 2018-05-27 DIAGNOSIS — Z79899 Other long term (current) drug therapy: Secondary | ICD-10-CM | POA: Diagnosis not present

## 2018-05-27 DIAGNOSIS — I10 Essential (primary) hypertension: Secondary | ICD-10-CM | POA: Diagnosis not present

## 2018-05-27 DIAGNOSIS — Z79890 Hormone replacement therapy (postmenopausal): Secondary | ICD-10-CM | POA: Diagnosis not present

## 2018-05-27 DIAGNOSIS — Z9049 Acquired absence of other specified parts of digestive tract: Secondary | ICD-10-CM | POA: Diagnosis not present

## 2018-05-27 DIAGNOSIS — Z923 Personal history of irradiation: Secondary | ICD-10-CM | POA: Diagnosis not present

## 2018-05-27 DIAGNOSIS — C7951 Secondary malignant neoplasm of bone: Secondary | ICD-10-CM | POA: Diagnosis not present

## 2018-05-27 DIAGNOSIS — G8220 Paraplegia, unspecified: Secondary | ICD-10-CM | POA: Diagnosis not present

## 2018-05-27 DIAGNOSIS — Z881 Allergy status to other antibiotic agents status: Secondary | ICD-10-CM | POA: Diagnosis not present

## 2018-05-27 DIAGNOSIS — C73 Malignant neoplasm of thyroid gland: Secondary | ICD-10-CM | POA: Diagnosis not present

## 2018-05-27 DIAGNOSIS — Z888 Allergy status to other drugs, medicaments and biological substances status: Secondary | ICD-10-CM | POA: Diagnosis not present

## 2018-05-27 DIAGNOSIS — Z9221 Personal history of antineoplastic chemotherapy: Secondary | ICD-10-CM | POA: Diagnosis not present

## 2018-05-27 DIAGNOSIS — E039 Hypothyroidism, unspecified: Secondary | ICD-10-CM | POA: Diagnosis not present

## 2018-05-27 HISTORY — DX: Pneumonia, unspecified organism: J18.9

## 2018-05-27 HISTORY — DX: Depression, unspecified: F32.A

## 2018-05-27 HISTORY — DX: Major depressive disorder, single episode, unspecified: F32.9

## 2018-05-27 HISTORY — DX: Personal history of other medical treatment: Z92.89

## 2018-05-27 HISTORY — DX: Anxiety disorder, unspecified: F41.9

## 2018-05-27 LAB — COMPREHENSIVE METABOLIC PANEL
ALBUMIN: 2.5 g/dL — AB (ref 3.5–5.0)
ALT: 10 U/L (ref 0–44)
ANION GAP: 11 (ref 5–15)
AST: 19 U/L (ref 15–41)
Alkaline Phosphatase: 159 U/L — ABNORMAL HIGH (ref 38–126)
BUN: 22 mg/dL — AB (ref 6–20)
CO2: 26 mmol/L (ref 22–32)
Calcium: 8.3 mg/dL — ABNORMAL LOW (ref 8.9–10.3)
Chloride: 103 mmol/L (ref 98–111)
Creatinine, Ser: 1.55 mg/dL — ABNORMAL HIGH (ref 0.44–1.00)
GFR calc Af Amer: 44 mL/min — ABNORMAL LOW (ref >60)
GFR calc non Af Amer: 38 mL/min — ABNORMAL LOW (ref >60)
GLUCOSE: 101 mg/dL — AB (ref 70–99)
POTASSIUM: 3.9 mmol/L (ref 3.5–5.1)
Sodium: 140 mmol/L (ref 135–145)
Total Bilirubin: 0.7 mg/dL (ref 0.3–1.2)
Total Protein: 6.2 g/dL — ABNORMAL LOW (ref 6.5–8.1)

## 2018-05-27 LAB — CBC
HCT: 27.6 % — ABNORMAL LOW (ref 36.0–46.0)
Hemoglobin: 8.4 g/dL — ABNORMAL LOW (ref 12.0–15.0)
MCH: 31.3 pg (ref 26.0–34.0)
MCHC: 30.4 g/dL (ref 30.0–36.0)
MCV: 103 fL — ABNORMAL HIGH (ref 80.0–100.0)
Platelets: 108 10*3/uL — ABNORMAL LOW (ref 150–400)
RBC: 2.68 MIL/uL — ABNORMAL LOW (ref 3.87–5.11)
RDW: 15.6 % — ABNORMAL HIGH (ref 11.5–15.5)
WBC: 2.6 10*3/uL — AB (ref 4.0–10.5)
nRBC: 0 % (ref 0.0–0.2)

## 2018-05-27 LAB — SURGICAL PCR SCREEN
MRSA, PCR: POSITIVE — AB
Staphylococcus aureus: POSITIVE — AB

## 2018-05-27 NOTE — Progress Notes (Addendum)
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  History includes never smoker, thyroid cancer (s/p total thyroidectomy 01/09/14), post-surgerical hypothyroidism, HTN, obesity, Right non-occlusive PE 12/14/13, heparin induced thrombocytopenia, GERD, post-operative N/V. S/p left femoral IM nailing 09/11/17, s/p cholecystectomy 19/5/09 (complicated by HCAP).  - She presented to ED on 12/12/13 with BLE weakness that progressed to paraplegia and diagnosed with a lytic T3 tumor with severe cord compression at T3 and markedly enlarged thyroid mass. Also non-occlusive right PE. Neurosurgeon Ashok Pall, MD took patient emergently to the OR for s/p T3 laminectomy/decompression 12/15/13 followed by T1-5 posterolateral arthrodesis 12/30/13. Pathology initially suspicious for metastatic adenocarcinoma, but thyroid mass suggestive of Hurthle cell carcinoma making the primary neoplasm most likely thyroid in origin. She underwent total thyroidectomy 01/09/14 which confirmed invasive Hurthle cell carcinoma. While hospitalized she was evaluated by [facility] for treatment recommendations. She has had positive HIT and was transitioned to Lovenox then argatroban.  She continued to have BLE paraparesis, and was discharged to [facility] 01/19/14 for continued therapies. - Over the years, she has undergone chemotherapy and radiation and required T3 laminectomy for tumor subtotal  resection 07/05/15 and posterior spinal tumor resection T3 09/03/17.  Per surgeon, last Xarelto 05/27/18 (on for history of PE 2015).   No acute symptoms currently. She is finishing up 10 day course of Augmentin for sinusitis (prescribed 05/16/18). She did have evidence of pleural effusions on 04/2027 PET scan, but no wheezing, cough, or SOB. Lungs sounds without wheezing, crackles, or rhonchi. O2 sats 100% and no conversational dyspnea. Discussed with anesthesiologist Oren Bracket, MD. Since patient clinically without acute respiratory signs/symptoms would defer preoperative CXR at this time. Assigned anesthesiologist can re-evaluate on the day of surgery. She will need STAT PT/INR on arrival.   Her preoperative H/H is 8.4/27.6 and PLT count 108K.  CBC Latest Ref Rng & Units 05/27/2018 05/21/2018 05/19/2018  WBC 4.0 - 10.5 K/uL 2.6(L) 3.7(L) 2.7(L)  Hemoglobin 12.0 - 15.0 g/dL 8.4(L) 8.5(L) 9.0(L)  Hematocrit 36.0 - 46.0 % 27.6(L) 27.8(L) 28.0(L)  Platelets 150 - 400 K/uL 108(L) 141(L) 119(L)  Results called to Janett Billow at Dr. Melven Sartorius office inquiring if Dr. Vertell Limber wanted [facility] to address prior to surgery versus have PRBC available on the day of surgery. (UPDATE 05/31/18 9:31 AM: Prepare 2 Units PRBC order entered. Defer to surgeon and/or anesthesiologist whether or not to transfuse perioperatively.)    VS: BP 106/60   Pulse 68   Temp 36.6 C   Resp 18   Ht 5\' 11"  (1.803 m)   Wt 97.1 kg   LMP 10/22/2016 (LMP Unknown)   SpO2 100%   BMI 29.85 kg/m  [name] a pleasant Caucasian female in NAD. She is in her motorized wheelchair. No conversational dyspnea. She denied chest pain, SOB at rest, syncope. She reports since 08/2017, she has noticed that she may  feel SOB for a few minutes after transfer from her chair to bed, but that symptoms have not been progressive. Her legs stay swollen, particularly because they are dependent most of the day. Swelling improved first thing in the morning. She says she  was taken off of diuretic therapy. She lives with her sister and brother-in-law. She denied any recent persistent coughing and feels like she does not have issues clearing secretions. Lungs are mildly diminished at that bases, but otherwise clear throughout. Heart RRR, no murmur noted. BLE large with generalized edema.    PROVIDERS: [name], MD is PCP. She was scheduled for ED follow-up (sinusitis, hypotension) on 05/27/18, but patient reports she is unable to make appointment due to family obligation. She feels sinusitis improved and BP back to baseline at 106/60 at PAT.  - Sullivan Lone, MD is [facility] oncologist. Previously had been followed by Leslie Andrea, MD at [facility]. Surgeon is reaching out to [name] about surgery plans since Xarelto is being held.   LABS: Most recent labs include: Lab Results  Component Value Date   WBC 2.6 (L) 05/27/2018   HGB 8.4 (L) 05/27/2018   HCT 27.6 (L) 05/27/2018   PLT 108 (L) 05/27/2018   GLUCOSE 101 (H) 05/27/2018   ALT 10 05/27/2018   AST 19 05/27/2018   NA 140 05/27/2018   K 3.9 05/27/2018   CL 103 05/27/2018   CREATININE 1.55 (H) 05/27/2018   BUN 22 (H) 05/27/2018   CO2 26 05/27/2018   TSH 8.188 (H) 03/02/2018   INR 1.17 09/15/2017   HGBA1C 5.5 08/09/2017   Lab Results  Component Value Date   CREATININE 1.55 (H) 05/27/2018   CREATININE 1.39 (H) 05/21/2018   CREATININE 1.41 (H) 05/19/2018     IMAGES: MRI Brain 05/18/18: IMPRESSION: 1. Progressive size of left parietal skull lesion concerning for additional site of metastatic Hurthle cell carcinoma. The lesion measures 2.2 x 2.3 x 1.7 cm. Underlying dural enhancement is likely reactive. A dural-based lesion such as meningioma with intraosseous extension is considered less likely. 2. Second site of focal dural thickening over the left occipital lobe. This is nonspecific. Small meningioma or cortical vessel could have this appearance. Additional sign metastasis is not excluded. 3.  Bilateral white matter changes are mildly advanced for age. 4. Mild mucosal thickening in the maxillary sinuses bilaterally appears chronic.  PET scan (restaging) 05/13/18: IMPRESSION: 1. Multifocal bilateral hypermetabolic pulmonary nodules are again noted. These appear increased in size when compared with the previous exam. Additionally, there is a new hypermetabolic nodule in the right middle lobe. 2. Hypermetabolic right paratracheal lymph node continues exhibits intense FDG uptake. This is not significantly changed in size but exhibits mild decrease in degree of FDG uptake. 3. Hypermetabolic left supraclavicular lymph nodes are again noted. These continue to exhibit similar degrees of intense uptake. The index left supraclavicular lymph node is mildly increased in size in the interval. 4. Increase in size and degree of FDG uptake associated with hypermetabolic right sternal lesion. 5. Persistent intense FDG uptake associated with T3 lesion. The degree of FDG uptake appears mildly improved when compared with previous exam. 6. New bilateral pleural effusions. There is also new bilateral lower extremity edema.   EKG: 08/09/17: Sinus rhythm Multiple premature complexes, vent & supraven Inferior infarct, old Confirmed by Nat Christen [phone]) on 08/09/2017 12:55:59 PM   CV: Echo 02/12/18: Study Conclusions - Left ventricle: The cavity size was normal. Wall thickness was   increased in a pattern  of mild LVH. Systolic function was normal.   The estimated ejection fraction was in the range of 55% to 60%.   Wall motion was normal; there were no regional wall motion   abnormalities. Left ventricular diastolic function parameters   were normal. - Aortic valve: Valve area (Vmax): 1.78 cm^2. - Atrial septum: No defect or patent foramen ovale was identified.   Past Medical History:  Diagnosis Date  . Cancer [facility])    FNA of thyroid positive for onconytic/hurthle cell carcinoma  .  Chronic back pain   . Complication of anesthesia    Nausea and vomiting  . DDD (degenerative disc disease), cervical   . DJD (degenerative joint disease)   . Family history of adverse reaction to anesthesia    MOTHER HAS NAUSEA   . GERD (gastroesophageal reflux disease)   . History of rectal fissure   . HIT (heparin-induced thrombocytopenia) ([place])   . Hypertension   . Hypothyroidism   . Obesity   . Paraplegia at T4 level ([place])   . Pulmonary embolus, right ([place]) 2015  . Rotator cuff tendonitis right    Past Surgical History:  Procedure Laterality Date  . CHOLECYSTECTOMY N/A 01/25/2018   Procedure: LAPAROSCOPIC CHOLECYSTECTOMY;  Surgeon: Virl Cagey, MD;  Location: [facility];  Service: General;  Laterality: N/A;  . FEMUR IM NAIL Left 09/11/2017   Procedure: INTRAMEDULLARY (IM) RETROGRADE FEMORAL NAILING;  Surgeon: Altamese Upson, MD;  Location: [place];  Service: Orthopedics;  Laterality: Left;  . LAMINECTOMY N/A 12/14/2013   Procedure: THORACIC LAMINECTOMY FOR TUMOR THORACIC THREE;  Surgeon: Ashok Pall, MD;  Location: [place] [facility];  Service: Neurosurgery;  Laterality: N/A;  THORACIC LAMINECTOMY FOR TUMOR THORACIC THREE  . LAMINECTOMY N/A 07/05/2015   Procedure: THORACIC LAMINECTOMY FOR TUMOR;  Surgeon: Ashok Pall, MD;  Location: [place] [facility];  Service: Neurosurgery;  Laterality: N/A;  . LAMINECTOMY N/A 09/03/2017   Procedure: POSTERIOR SPINAL TUMOR RESECTION THORACIC THREE;  Surgeon: Ashok Pall, MD;  Location: [place];  Service: Neurosurgery;  Laterality: N/A;  POSTERIOR SPINAL TUMOR RESECTION THORACIC THREE  . POSTERIOR LUMBAR FUSION 4 LEVEL N/A 12/30/2013   Procedure: Thoracic one-Thoracic five posterior thoracic fusion with pedicle screws;  Surgeon: Ashok Pall, MD;  Location: [place] [facility];  Service: Neurosurgery;  Laterality: N/A;  Thoracic one-Thoracic five posterior thoracic fusion with pedicle screws  . THYROIDECTOMY N/A 01/09/2014   Procedure: TOTAL THYROIDECTOMY;  Surgeon:  Izora Gala, MD;  Location: [place];  Service: ENT;  Laterality: N/A;  . TONSILLECTOMY      MEDICATIONS: . acetaminophen (TYLENOL) 325 MG tablet  . albuterol (PROVENTIL) (2.5 MG/3ML) 0.083% nebulizer solution  . amoxicillin-clavulanate (AUGMENTIN) 875-125 MG tablet  . baclofen (LIORESAL) 10 MG tablet  . furosemide (LASIX) 40 MG tablet  . ipratropium-albuterol (DUONEB) 0.5-2.5 (3) MG/3ML SOLN  . levothyroxine (SYNTHROID, LEVOTHROID) 150 MCG tablet  . LORazepam (ATIVAN) 1 MG tablet  . Multiple Vitamin (MULTIVITAMIN WITH MINERALS) TABS tablet  . ondansetron (ZOFRAN) 4 MG tablet  . oxyCODONE (ROXICODONE) 5 MG immediate release tablet  . pantoprazole (PROTONIX) 40 MG tablet  . polyethylene glycol (MIRALAX / GLYCOLAX) packet  . rivaroxaban (XARELTO) 20 MG TABS tablet  . senna (SENOKOT) 8.6 MG TABS tablet  . sertraline (ZOLOFT) 50 MG tablet   No current facility-administered medications for this encounter.     Myra Gianotti, PA-C Surgical Short Stay/Anesthesiology [facility] Phone [phone] [facility] Phone [phone] 05/27/2018 4:08 PM

## 2018-05-27 NOTE — Pre-Procedure Instructions (Addendum)
Sarah [name]  05/27/2018     Your procedure is scheduled on Tuesday, March 10  Report to [facility] Admitting Entrance "A" at 1:40 PM  Your surgery or procedure is scheduled for3 :45 PM  Call this number if you have problems the morning of surgery:[phone]  This is the number for the Pre- Surgical Desk. For any other questions, please call [phone], Monday - Friday 8 AM - 4 PM.   Remember:  Do not eat or drink after midnight Monday, March 9.    Take these medicines the morning of surgery with A SIP OF WATER  amoxicillin-clavulanate (AUGMENTIN)  baclofen (LIORESAL) levothyroxine (SYNTHROID, LEVOTHROID)  pantoprazole (PROTONIX)  sertraline (ZOLOFT)   As needed: ondansetron (ZOFRAN), acetaminophen (TYLENOL), nebulizer treatments.   Follow your surgeon's instructions on when to stop/resume rivaroxaban (XARELTO).  If no instructions were given by your surgeon then you will need to call the office to get those instructions.    As of today, STOP taking any Aspirin (unless otherwise instructed by your surgeon), Aleve, Naproxen, Ibuprofen, Motrin, Advil, Goody's, BC's, all herbal medications, fish oil, and all vitamins.    Do not wear jewelry, make-up or nail polish.  Do not wear lotions, powders, or perfumes, or deodorant.  Do not shave 48 hours prior to surgery.  Do not bring valuables to the hospital.  [facility] is not responsible for any belongings or valuables.  Contacts, dentures or bridgework may not be worn into surgery.  Leave your suitcase in the car.  After surgery it may be brought to your room.  For patients admitted to the hospital, discharge time will be determined by your treatment team.  Patients discharged the day of surgery will not be allowed to drive home.   Special instructions:   [place]- Preparing For Surgery  Before surgery, you can play an important role. Because skin is not sterile, your skin needs to be as free of germs as  possible. You can reduce the number of germs on your skin by washing with CHG (chlorahexidine gluconate) Soap before surgery.  CHG is an antiseptic cleaner which kills germs and bonds with the skin to continue killing germs even after washing.    Oral Hygiene is also important to reduce your risk of infection.  Remember - BRUSH YOUR TEETH THE MORNING OF SURGERY WITH YOUR REGULAR TOOTHPASTE  Please do not use if you have an allergy to CHG or antibacterial soaps. If your skin becomes reddened/irritated stop using the CHG.  Do not shave (including legs and underarms) for at least 48 hours prior to first CHG shower. It is OK to shave your face.  Please follow these instructions carefully.   1. Shower the NIGHT BEFORE SURGERY and the MORNING OF SURGERY with CHG.   2. If you chose to wash your hair, wash your hair first as usual with your normal shampoo.  3. After you shampoo, wash your face and private area with the soap you use at home, then rinse your hair and body thoroughly to remove the shampoo and soap.  4. Use CHG as you would any other liquid soap. You can apply CHG directly to the skin and wash gently with a scrungie or a clean washcloth.   Apply the CHG Soap to your body ONLY FROM THE NECK DOWN.  Do not use on open wounds or open sores. Avoid contact with your eyes, ears, mouth and genitals (private parts).  5. Wash thoroughly, paying special  attention to the area where your surgery will be performed.  6. Thoroughly rinse your body with warm water from the neck down.  7. DO NOT shower/wash with your normal soap after using and rinsing off the CHG Soap.  8. Pat yourself dry with a CLEAN TOWEL.  9. Wear CLEAN PAJAMAS to bed the night before surgery, wear comfortable clothes the morning of surgery  10. Place CLEAN SHEETS on your bed the night of your first shower and DO NOT SLEEP WITH PETS.    Day of Surgery: Shower as written above   Do not apply any deodorants/lotions, power or  colognes.  Please wear clean clothes to the hospital/surgery center.   Remember to brush your teeth WITH YOUR REGULAR TOOTHPASTE.  Do not wear jewelry, make-up or nail polish.  Do not shave 48 hours prior to surgery.  Do not bring valuables to the hospital.  [facility] is not responsible for any belongings or valuables.  Contacts, dentures or bridgework may not be worn into surgery.  Leave your suitcase in the car.  After surgery it may be brought to your room.  For patients admitted to the hospital, discharge time will be determined by your treatment team.  Patients discharged the day of surgery will not be allowed to drive home.   Please read over the following fact sheets that you were given.

## 2018-05-27 NOTE — Progress Notes (Signed)
I called a prescription for Mupirocin ointment to [facility].

## 2018-05-27 NOTE — Progress Notes (Signed)
PCP - Dr A. Juniata  Cardiologist - no  Chest x-ray - 01/2018  EKG - 08/10/2017  Stress Test - no  ECHO - 02/12/2018  Cardiac Cath - no  Sleep Study - na CPAP - na  LABS- CBC, CBP, T/S  Xarelto  last dose today  HA1C-na Fasting Blood Sugar - na Checks Blood Sugar ____o_ times a day  Anesthesia-  Allizon Z. PA- C saw patient  Pt denies having chest pain, sob, or fever at this time. All instructions explained to the pt, with a verbal understanding of the material. Pt agrees to go over the instructions while at home for a better understanding. The opportunity to ask questions was provided.

## 2018-05-27 NOTE — H&P (Signed)
Patient ID:   [id number] Patient: Sarah [name]  Date of Birth: [birth year] Visit Type: Office Visit   Date: 05/26/2018 12:15 PM Provider: Marchia Meiers. Vertell Limber MD   This 53 year old female presents for Skull metastasis and Brain metastasis.  HISTORY OF PRESENT ILLNESS: 1.  Skull metastasis  2.  Brain metastasis  Hewitt Blade, patient of Dr. Lacy Duverney, visits to discuss resection of skull metastasis followed by radiation and stereotactic radiation to brain met.    Thyroid cancer with a T3 metastasis and spinal cord compression (paralysis) was diagnosed in 2016, after which she underwent 2 surgeries, radiation, and chemotherapy.  T3 metastasis was removed and spinal cord decompressed by Dr. Cyndy Freeze.  Cholecystectomy November 2019 Postop pneumonia required ICU stay and antibiotics.  Baclofen was stopped due to hypotension.  (She reports some difficulty with lower extremity and trunk spasticity date times and evenings; to follow up with Dr. Tessa Lerner in the near future.)  Augmentin continues for sinus infection.  [name] currently on Xarelto  MRI on [place]      Medical/Surgical/Interim History Reviewed, no change.  Last detailed document date:01/04/2015.     Family History: Reviewed, no changes.  Last detailed document date:04/25/2014.   Social History: Reviewed, no changes. Last detailed document date: 04/25/2014.    MEDICATIONS: (added, continued or stopped this visit) Started Medication Directions Instruction Stopped  Augmentin 875 mg-125 mg tablet take 1 tablet by oral route  every 12 hours    baclofen 20 mg tablet take 1 tablet by oral route 3 times every day    Diflucan 200 mg tablet take 1 tablet by oral route  every day  05/26/2018  Lenvima 24 mg per day (10 mg x 2 and 4 mg x 1) capsule   05/26/2018  levothyroxine 150 mcg tablet take 1 tablet by oral route  every day    levothyroxine 200 mcg tablet take 1 tablet by oral route  every  day  05/26/2018  promethazine 25 mg tablet take 1 tablet by oral route  every 4 - 6 hours as needed    Protonix 40 mg tablet,delayed release take 1 tablet by oral route  every day    sennosides 8.6 mg tablet take 2 tablet by oral route  every day as needed for constipation    sertraline 50 mg tablet take 1 tablet by oral route  every day    Tylenol Extra Strength 500 mg tablet take 2 tablet by oral route  every 6 hours as needed    Xarelto 20 mg tablet take 1 tablet by oral route  every day with the evening meal      ALLERGIES: Ingredient Reaction Medication Name Comment TRAMADOL vomiting   HYDROCODONE vomiting   HEPARIN    OXYCODONE HCL  OxyContin  CEPHALEXIN MONOHYDRATE GI upset Keflex      PHYSICAL EXAM:  Vitals Date Temp F BP Pulse Ht In Wt Lb BMI BSA Pain Score 05/26/2018  110/77 62 70 214 30.71  2/10     IMPRESSION:  MRI reveals left posterior tumor, dura brightness. Impression here.  Upon examination, denies numbness or weakness in arms, full strength in bilateral upper extremities, unable to control bowel and bladder, patient does not feel bilateral lower extremities (only notes spasms) but family notes sensation is improving, unable to move bilateral lower extremities, no scalp tenderness to palpation.  Recommended proceeding with craniectomy of larger tumor followed by radiosurgery. Radiosurgery will also be performed for small tumor without surgical removal. Discussed surgery  with patient - advised patient some of the dura around the tumor may need to be removed. Also advised patient that a titanium plate would be placed over the where the craniectomy is performed. Discussed scheduling - surgery scheduled for 05/28/18. However, patient was very reluctant to schedule surgery this early as she is worried about the surgery. Other dates were offered to the patient but it was recommended patient proceed with surgery  on 05/28/18. After discussion patient agreed and surgery will be scheduled for 05/28/18. [name] currently on Xarelto  - patient was advised to not take her dose today and discontinue Xarelto until she is able to resume after recovery from surgery. Patient will follow-up 2 weeks after surgery for staple removal.  [name] on an anti-coagulant, anti-inflammatory or supplement that may increase bleeding time. Patient advised to stop medicine prior to surgery.  Comments:  Dr. Vertell Limber instructed her to stop Xarelto now  PLAN: Resection of metastatic skull lesion  Orders: Instruction(s)/Education: Assessment Instruction Z68.30 Lifestyle education regarding diet  Completed Orders (this encounter) Order Details Reason Side Interpretation Result Initial Treatment Date Region Lifestyle education regarding diet Patient encouraged to eat a well balance diet        Assessment/Plan  # Detail Type Description  1. 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     2. Assessment Malignant (primary) neoplasm, unspecified (C80.1).     3. Assessment Thyroid cancer (C73).     4. Assessment Brain metastasis (C79.31).     5. Assessment Body mass index (BMI) 30.0-30.9, adult (Z68.30).  Plan Orders Today's instructions / counseling include(s) Lifestyle education regarding diet. Clinical information/comments: Patient encouraged to eat a well balance diet.       Pain Management Plan Pain Scale: 2/10. Method: Numeric Pain Intensity Scale. Location: brain. Onset: 11/22/2013. Duration: varies. Quality: discomforting. Pain management follow-up plan of care: Patient taking medication as prescribed.  Fall Risk Plan The patient has not fallen in the last year. Falls risk follow-up plan of care: Balance, strength, and gait training: Advise to use safety measures when necessary.              Provider:  Marchia Meiers. Vertell Limber MD  05/27/2018 07:48 AM Dictation edited by: Marchia Meiers. Vertell Limber    CC Providers: Dossie Arbour [address] [place],  [place]  [id number]   [facility]  [address] [place], [place] [address]-               Electronically signed by Marchia Meiers Vertell Limber MD on 05/27/2018 07:48 AM

## 2018-05-29 LAB — THYROGLOBULIN LEVEL: Thyroglobulin: 404 ng/mL — ABNORMAL HIGH

## 2018-05-31 NOTE — Anesthesia Preprocedure Evaluation (Addendum)
Anesthesia Evaluation  Patient identified by MRN, date of birth, ID band Patient awake    Reviewed: Allergy & Precautions, NPO status , Patient's Chart, lab work & pertinent test results  Airway Mallampati: III  TM Distance: >3 FB Neck ROM: Full    Dental  (+) Loose,    Pulmonary shortness of breath and with exertion, PE   Pulmonary exam normal breath sounds clear to auscultation       Cardiovascular hypertension, Normal cardiovascular exam Rhythm:Regular Rate:Normal  ECHO Left ventricle: The cavity size was normal. Wall thickness was increased in a pattern of mild LVH. Systolic function was normal. The estimated ejection fraction was in the range of 55% to 60%. Wall motion was normal; there were no regional wall motion abnormalities. Left ventricular diastolic function parameters were normal. Aortic valve: Valve area (Vmax): 1.78 cm^2. Atrial septum: No defect or patent foramen ovale was identified.   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  Reproductive/Obstetrics                           Anesthesia Physical Anesthesia Plan  ASA: III  Anesthesia Plan: General   Post-op Pain Management:    Induction: Intravenous  PONV Risk Score and Plan: 3 and Ondansetron, Dexamethasone and Treatment may vary due to age or medical condition  Airway Management Planned: Oral  ETT  Additional Equipment:   Intra-op Plan:   Post-operative Plan: Extubation in OR  Informed Consent: I have reviewed the patients History and Physical, chart, labs and discussed the procedure including the risks, benefits and alternatives for the proposed anesthesia with the patient or authorized representative who has indicated his/her understanding and acceptance.     Dental advisory given  Plan Discussed with: CRNA  Anesthesia Plan Comments: (Reviewed PAT note written by Myra Gianotti, PA-C. )      Anesthesia Quick Evaluation

## 2018-06-01 ENCOUNTER — Inpatient Hospital Stay (HOSPITAL_COMMUNITY): Payer: Medicare Other | Admitting: Vascular Surgery

## 2018-06-01 ENCOUNTER — Encounter (HOSPITAL_COMMUNITY)
Admission: RE | Disposition: A | Discharge status: Home / Self Care | Payer: Self-pay | Source: Home / Self Care | Attending: Neurosurgery

## 2018-06-01 ENCOUNTER — Inpatient Hospital Stay (HOSPITAL_COMMUNITY)
Admission: RE | Admit: 2018-06-01 | Discharge: 2018-06-02 | DRG: 026 | Disposition: A | Discharge status: Home / Self Care | Payer: Medicare Other | Attending: Neurosurgery | Admitting: Neurosurgery

## 2018-06-01 ENCOUNTER — Encounter (HOSPITAL_COMMUNITY): Payer: Self-pay | Admitting: Certified Registered Nurse Anesthetist

## 2018-06-01 DIAGNOSIS — F418 Other specified anxiety disorders: Secondary | ICD-10-CM | POA: Diagnosis present

## 2018-06-01 DIAGNOSIS — Z9221 Personal history of antineoplastic chemotherapy: Secondary | ICD-10-CM | POA: Diagnosis not present

## 2018-06-01 DIAGNOSIS — I10 Essential (primary) hypertension: Secondary | ICD-10-CM | POA: Diagnosis present

## 2018-06-01 DIAGNOSIS — Z9049 Acquired absence of other specified parts of digestive tract: Secondary | ICD-10-CM | POA: Diagnosis not present

## 2018-06-01 DIAGNOSIS — Z7901 Long term (current) use of anticoagulants: Secondary | ICD-10-CM

## 2018-06-01 DIAGNOSIS — C73 Malignant neoplasm of thyroid gland: Secondary | ICD-10-CM | POA: Diagnosis not present

## 2018-06-01 DIAGNOSIS — Z79890 Hormone replacement therapy (postmenopausal): Secondary | ICD-10-CM | POA: Diagnosis not present

## 2018-06-01 DIAGNOSIS — C7931 Secondary malignant neoplasm of brain: Secondary | ICD-10-CM | POA: Diagnosis not present

## 2018-06-01 DIAGNOSIS — Z888 Allergy status to other drugs, medicaments and biological substances status: Secondary | ICD-10-CM | POA: Diagnosis not present

## 2018-06-01 DIAGNOSIS — Z885 Allergy status to narcotic agent status: Secondary | ICD-10-CM | POA: Diagnosis not present

## 2018-06-01 DIAGNOSIS — Z923 Personal history of irradiation: Secondary | ICD-10-CM

## 2018-06-01 DIAGNOSIS — E039 Hypothyroidism, unspecified: Secondary | ICD-10-CM | POA: Diagnosis not present

## 2018-06-01 DIAGNOSIS — G8220 Paraplegia, unspecified: Secondary | ICD-10-CM | POA: Diagnosis not present

## 2018-06-01 DIAGNOSIS — Z881 Allergy status to other antibiotic agents status: Secondary | ICD-10-CM | POA: Diagnosis not present

## 2018-06-01 DIAGNOSIS — C801 Malignant (primary) neoplasm, unspecified: Secondary | ICD-10-CM | POA: Diagnosis present

## 2018-06-01 DIAGNOSIS — Z79899 Other long term (current) drug therapy: Secondary | ICD-10-CM

## 2018-06-01 DIAGNOSIS — C7949 Secondary malignant neoplasm of other parts of nervous system: Secondary | ICD-10-CM | POA: Diagnosis not present

## 2018-06-01 DIAGNOSIS — C7951 Secondary malignant neoplasm of bone: Secondary | ICD-10-CM | POA: Diagnosis present

## 2018-06-01 HISTORY — PX: CRANIOTOMY: SHX93

## 2018-06-01 HISTORY — PX: APPLICATION OF CRANIAL NAVIGATION: SHX6578

## 2018-06-01 LAB — PREPARE RBC (CROSSMATCH)

## 2018-06-01 LAB — PROTIME-INR
INR: 1.1 (ref 0.8–1.2)
Prothrombin Time: 14.1 seconds (ref 11.4–15.2)

## 2018-06-01 SURGERY — CRANIOTOMY TUMOR EXCISION
Anesthesia: General | Site: Head

## 2018-06-01 MED ORDER — PHENYLEPHRINE 40 MCG/ML (10ML) SYRINGE FOR IV PUSH (FOR BLOOD PRESSURE SUPPORT)
PREFILLED_SYRINGE | INTRAVENOUS | Status: DC | PRN
  Administered 2018-06-01 (×4): 80 ug via INTRAVENOUS

## 2018-06-01 MED ORDER — ACETAMINOPHEN 650 MG RE SUPP: 650.0000 mg | RECTAL | Status: DC | PRN

## 2018-06-01 MED ORDER — ACETAMINOPHEN 325 MG PO TABS
650.0000 mg | ORAL_TABLET | ORAL | Status: DC | PRN
  Filled 2018-06-01 (×2): qty 2

## 2018-06-01 MED ORDER — FENTANYL CITRATE (PF) 250 MCG/5ML IJ SOLN
INTRAMUSCULAR | Status: AC
  Filled 2018-06-01: qty 5

## 2018-06-01 MED ORDER — KETOROLAC TROMETHAMINE 15 MG/ML IJ SOLN: 15.0000 mg | Freq: Once | INTRAMUSCULAR | Status: DC | PRN

## 2018-06-01 MED ORDER — FLEET ENEMA 7-19 GM/118ML RE ENEM: 1.0000 | ENEMA | Freq: Once | RECTAL | Status: DC | PRN

## 2018-06-01 MED ORDER — SERTRALINE HCL 50 MG PO TABS
50.0000 mg | ORAL_TABLET | Freq: Every day | ORAL | Status: DC
  Administered 2018-06-02: 50 mg via ORAL
  Filled 2018-06-01: qty 1

## 2018-06-01 MED ORDER — AMOXICILLIN-POT CLAVULANATE 875-125 MG PO TABS
1.0000 | ORAL_TABLET | Freq: Two times a day (BID) | ORAL | Status: DC
  Administered 2018-06-02: 1 via ORAL
  Filled 2018-06-01 (×2): qty 1

## 2018-06-01 MED ORDER — SODIUM CHLORIDE 0.9 % IV SOLN
INTRAVENOUS | Status: DC
  Administered 2018-06-01 (×3): via INTRAVENOUS

## 2018-06-01 MED ORDER — DOCUSATE SODIUM 100 MG PO CAPS
100.0000 mg | ORAL_CAPSULE | Freq: Two times a day (BID) | ORAL | Status: DC
  Filled 2018-06-01 (×2): qty 1

## 2018-06-01 MED ORDER — POLYETHYLENE GLYCOL 3350 17 G PO PACK: 17.0000 g | PACK | Freq: Every day | ORAL | Status: DC

## 2018-06-01 MED ORDER — ONDANSETRON HCL 4 MG/2ML IJ SOLN: 4.0000 mg | Freq: Once | INTRAMUSCULAR | Status: DC | PRN

## 2018-06-01 MED ORDER — PANTOPRAZOLE SODIUM 40 MG IV SOLR
40.0000 mg | Freq: Every day | INTRAVENOUS | Status: DC
  Administered 2018-06-01: 40 mg via INTRAVENOUS
  Filled 2018-06-01: qty 40

## 2018-06-01 MED ORDER — LEVOTHYROXINE SODIUM 75 MCG PO TABS
150.0000 ug | ORAL_TABLET | Freq: Every day | ORAL | Status: DC
  Administered 2018-06-02: 150 ug via ORAL
  Filled 2018-06-01: qty 2

## 2018-06-01 MED ORDER — SODIUM CHLORIDE 0.9 % IV SOLN
INTRAVENOUS | Status: DC | PRN
  Administered 2018-06-01: 25 ug/min via INTRAVENOUS

## 2018-06-01 MED ORDER — THROMBIN 5000 UNITS EX SOLR
OROMUCOSAL | Status: DC | PRN
  Administered 2018-06-01: 5 mL via TOPICAL

## 2018-06-01 MED ORDER — BUPIVACAINE HCL (PF) 0.5 % IJ SOLN
INTRAMUSCULAR | Status: AC
  Filled 2018-06-01: qty 30

## 2018-06-01 MED ORDER — BACLOFEN 10 MG PO TABS: 25.0000 mg | ORAL_TABLET | Freq: Four times a day (QID) | ORAL | Status: DC | PRN

## 2018-06-01 MED ORDER — LIDOCAINE 2% (20 MG/ML) 5 ML SYRINGE
INTRAMUSCULAR | Status: DC | PRN
  Administered 2018-06-01: 60 mg via INTRAVENOUS

## 2018-06-01 MED ORDER — LIDOCAINE-EPINEPHRINE 1 %-1:100000 IJ SOLN
INTRAMUSCULAR | Status: AC
  Filled 2018-06-01: qty 1

## 2018-06-01 MED ORDER — ROCURONIUM BROMIDE 50 MG/5ML IV SOSY
PREFILLED_SYRINGE | INTRAVENOUS | Status: AC
  Filled 2018-06-01: qty 5

## 2018-06-01 MED ORDER — BACITRACIN ZINC 500 UNIT/GM EX OINT
TOPICAL_OINTMENT | CUTANEOUS | Status: DC | PRN
  Administered 2018-06-01: 1 via TOPICAL

## 2018-06-01 MED ORDER — LIDOCAINE 2% (20 MG/ML) 5 ML SYRINGE
INTRAMUSCULAR | Status: AC
  Filled 2018-06-01: qty 5

## 2018-06-01 MED ORDER — SUGAMMADEX SODIUM 200 MG/2ML IV SOLN
INTRAVENOUS | Status: DC | PRN
  Administered 2018-06-01: 195 mg via INTRAVENOUS

## 2018-06-01 MED ORDER — PROPOFOL 10 MG/ML IV BOLUS
INTRAVENOUS | Status: DC | PRN
  Administered 2018-06-01: 150 mg via INTRAVENOUS
  Administered 2018-06-01: 50 mg via INTRAVENOUS

## 2018-06-01 MED ORDER — PROPOFOL 10 MG/ML IV BOLUS
INTRAVENOUS | Status: AC
  Filled 2018-06-01: qty 20

## 2018-06-01 MED ORDER — THROMBIN 20000 UNITS EX SOLR
CUTANEOUS | Status: DC | PRN
  Administered 2018-06-01: 20 mL via TOPICAL

## 2018-06-01 MED ORDER — ONDANSETRON HCL 4 MG/2ML IJ SOLN
INTRAMUSCULAR | Status: AC
  Filled 2018-06-01: qty 2

## 2018-06-01 MED ORDER — ACETAMINOPHEN 500 MG PO TABS
1000.0000 mg | ORAL_TABLET | Freq: Once | ORAL | Status: AC
  Administered 2018-06-01: 1000 mg via ORAL
  Filled 2018-06-01: qty 2

## 2018-06-01 MED ORDER — ALBUTEROL SULFATE (2.5 MG/3ML) 0.083% IN NEBU: 2.5000 mg | INHALATION_SOLUTION | Freq: Four times a day (QID) | RESPIRATORY_TRACT | Status: DC | PRN

## 2018-06-01 MED ORDER — ONDANSETRON HCL 4 MG/2ML IJ SOLN
INTRAMUSCULAR | Status: DC | PRN
  Administered 2018-06-01: 4 mg via INTRAVENOUS

## 2018-06-01 MED ORDER — PHENYLEPHRINE 40 MCG/ML (10ML) SYRINGE FOR IV PUSH (FOR BLOOD PRESSURE SUPPORT)
PREFILLED_SYRINGE | INTRAVENOUS | Status: AC
  Filled 2018-06-01: qty 10

## 2018-06-01 MED ORDER — OXYCODONE HCL 5 MG PO TABS
5.0000 mg | ORAL_TABLET | Freq: Four times a day (QID) | ORAL | Status: DC | PRN
  Administered 2018-06-01 – 2018-06-02 (×4): 5 mg via ORAL
  Filled 2018-06-01 (×4): qty 1

## 2018-06-01 MED ORDER — CHLORHEXIDINE GLUCONATE CLOTH 2 % EX PADS: 6.0000 | MEDICATED_PAD | Freq: Once | CUTANEOUS | Status: DC

## 2018-06-01 MED ORDER — LIDOCAINE-EPINEPHRINE 1 %-1:100000 IJ SOLN
INTRAMUSCULAR | Status: DC | PRN
  Administered 2018-06-01: 5 mL

## 2018-06-01 MED ORDER — 0.9 % SODIUM CHLORIDE (POUR BTL) OPTIME
TOPICAL | Status: DC | PRN
  Administered 2018-06-01 (×2): 1000 mL

## 2018-06-01 MED ORDER — ONDANSETRON HCL 4 MG PO TABS: 4.0000 mg | ORAL_TABLET | ORAL | Status: DC | PRN

## 2018-06-01 MED ORDER — PROMETHAZINE HCL 25 MG PO TABS: 12.5000 mg | ORAL_TABLET | ORAL | Status: DC | PRN

## 2018-06-01 MED ORDER — DEXAMETHASONE SODIUM PHOSPHATE 10 MG/ML IJ SOLN
INTRAMUSCULAR | Status: AC
  Filled 2018-06-01: qty 1

## 2018-06-01 MED ORDER — ONDANSETRON HCL 4 MG/2ML IJ SOLN: 4.0000 mg | INTRAMUSCULAR | Status: DC | PRN

## 2018-06-01 MED ORDER — ADULT MULTIVITAMIN W/MINERALS CH
1.0000 | ORAL_TABLET | Freq: Every day | ORAL | Status: DC
  Administered 2018-06-02: 1 via ORAL
  Filled 2018-06-01: qty 1

## 2018-06-01 MED ORDER — MIDAZOLAM HCL 2 MG/2ML IJ SOLN
INTRAMUSCULAR | Status: AC
  Filled 2018-06-01: qty 2

## 2018-06-01 MED ORDER — IPRATROPIUM-ALBUTEROL 0.5-2.5 (3) MG/3ML IN SOLN
3.0000 mL | Freq: Three times a day (TID) | RESPIRATORY_TRACT | Status: DC
  Administered 2018-06-01: 3 mL via RESPIRATORY_TRACT
  Filled 2018-06-01: qty 3

## 2018-06-01 MED ORDER — POLYETHYLENE GLYCOL 3350 17 G PO PACK: 17.0000 g | PACK | Freq: Every day | ORAL | Status: DC | PRN

## 2018-06-01 MED ORDER — FENTANYL CITRATE (PF) 100 MCG/2ML IJ SOLN: 25.0000 ug | INTRAMUSCULAR | Status: DC | PRN

## 2018-06-01 MED ORDER — BUPIVACAINE HCL (PF) 0.5 % IJ SOLN
INTRAMUSCULAR | Status: DC | PRN
  Administered 2018-06-01: 5 mL

## 2018-06-01 MED ORDER — LABETALOL HCL 5 MG/ML IV SOLN: 10.0000 mg | INTRAVENOUS | Status: DC | PRN

## 2018-06-01 MED ORDER — DEXAMETHASONE SODIUM PHOSPHATE 10 MG/ML IJ SOLN
INTRAMUSCULAR | Status: DC | PRN
  Administered 2018-06-01: 10 mg via INTRAVENOUS

## 2018-06-01 MED ORDER — SENNA 8.6 MG PO TABS: 1.0000 | ORAL_TABLET | Freq: Every evening | ORAL | Status: DC | PRN

## 2018-06-01 MED ORDER — THROMBIN 5000 UNITS EX SOLR
CUTANEOUS | Status: AC
  Filled 2018-06-01: qty 5000

## 2018-06-01 MED ORDER — ONDANSETRON HCL 4 MG PO TABS: 4.0000 mg | ORAL_TABLET | Freq: Four times a day (QID) | ORAL | Status: DC | PRN

## 2018-06-01 MED ORDER — ACETAMINOPHEN 325 MG PO TABS
650.0000 mg | ORAL_TABLET | ORAL | Status: DC | PRN
  Administered 2018-06-01 – 2018-06-02 (×2): 650 mg via ORAL

## 2018-06-01 MED ORDER — FENTANYL CITRATE (PF) 250 MCG/5ML IJ SOLN
INTRAMUSCULAR | Status: DC | PRN
  Administered 2018-06-01: 100 ug via INTRAVENOUS

## 2018-06-01 MED ORDER — MIDAZOLAM HCL 2 MG/2ML IJ SOLN
INTRAMUSCULAR | Status: DC | PRN
  Administered 2018-06-01: 2 mg via INTRAVENOUS

## 2018-06-01 MED ORDER — PANTOPRAZOLE SODIUM 40 MG PO TBEC: 40.0000 mg | DELAYED_RELEASE_TABLET | Freq: Every day | ORAL | Status: DC

## 2018-06-01 MED ORDER — ROCURONIUM BROMIDE 10 MG/ML (PF) SYRINGE
PREFILLED_SYRINGE | INTRAVENOUS | Status: DC | PRN
  Administered 2018-06-01: 20 mg via INTRAVENOUS
  Administered 2018-06-01: 30 mg via INTRAVENOUS

## 2018-06-01 MED ORDER — MORPHINE SULFATE (PF) 2 MG/ML IV SOLN: 1.0000 mg | INTRAVENOUS | Status: DC | PRN

## 2018-06-01 MED ORDER — VANCOMYCIN HCL IN DEXTROSE 1-5 GM/200ML-% IV SOLN
1000.0000 mg | INTRAVENOUS | Status: AC
  Administered 2018-06-01: 1000 mg via INTRAVENOUS
  Filled 2018-06-01: qty 200

## 2018-06-01 MED ORDER — THROMBIN 20000 UNITS EX SOLR
CUTANEOUS | Status: AC
  Filled 2018-06-01: qty 20000

## 2018-06-01 MED ORDER — VANCOMYCIN HCL IN DEXTROSE 1-5 GM/200ML-% IV SOLN
1000.0000 mg | Freq: Two times a day (BID) | INTRAVENOUS | Status: AC
  Administered 2018-06-01 – 2018-06-02 (×2): 1000 mg via INTRAVENOUS
  Filled 2018-06-01 (×2): qty 200

## 2018-06-01 MED ORDER — POTASSIUM CHLORIDE IN NACL 20-0.9 MEQ/L-% IV SOLN
INTRAVENOUS | Status: DC
  Administered 2018-06-01 – 2018-06-02 (×2): via INTRAVENOUS
  Filled 2018-06-01 (×2): qty 1000

## 2018-06-01 MED ORDER — BISACODYL 10 MG RE SUPP: 10.0000 mg | Freq: Every day | RECTAL | Status: DC | PRN

## 2018-06-01 MED ORDER — BACITRACIN ZINC 500 UNIT/GM EX OINT
TOPICAL_OINTMENT | CUTANEOUS | Status: AC
  Filled 2018-06-01: qty 28.35

## 2018-06-01 MED ORDER — IPRATROPIUM-ALBUTEROL 0.5-2.5 (3) MG/3ML IN SOLN
3.0000 mL | Freq: Three times a day (TID) | RESPIRATORY_TRACT | Status: DC
  Administered 2018-06-01 – 2018-06-02 (×2): 3 mL via RESPIRATORY_TRACT
  Filled 2018-06-01: qty 3

## 2018-06-01 SURGICAL SUPPLY — 86 items
BATTERY IQ STERILE (MISCELLANEOUS) ×4 IMPLANT
BIT DRILL WIRE PASS 1.3MM (BIT) IMPLANT
BLADE CLIPPER SURG (BLADE) ×4 IMPLANT
BNDG STRETCH 4X75 STRL LF (GAUZE/BANDAGES/DRESSINGS) ×8 IMPLANT
BUR ACORN 6.0 PRECISION (BURR) ×3 IMPLANT
BUR ACORN 6.0MM PRECISION (BURR) ×1
BUR SPIRAL ROUTER 2.3 (BUR) ×3 IMPLANT
BUR SPIRAL ROUTER 2.3MM (BUR) ×1
CANISTER SUCT 3000ML PPV (MISCELLANEOUS) ×4 IMPLANT
CARTRIDGE OIL MAESTRO DRILL (MISCELLANEOUS) ×2 IMPLANT
CLIP VESOCCLUDE MED 6/CT (CLIP) IMPLANT
CONT SPEC 4OZ CLIKSEAL STRL BL (MISCELLANEOUS) ×4 IMPLANT
COVER MAYO STAND STRL (DRAPES) IMPLANT
COVER WAND RF STERILE (DRAPES) ×4 IMPLANT
DECANTER SPIKE VIAL GLASS SM (MISCELLANEOUS) ×4 IMPLANT
DIFFUSER DRILL AIR PNEUMATIC (MISCELLANEOUS) ×4 IMPLANT
DRAPE MICROSCOPE LEICA (MISCELLANEOUS) IMPLANT
DRAPE NEUROLOGICAL W/INCISE (DRAPES) ×4 IMPLANT
DRAPE STERI IOBAN 125X83 (DRAPES) IMPLANT
DRAPE WARM FLUID 44X44 (DRAPE) ×4 IMPLANT
DRILL WIRE PASS 1.3MM (BIT)
DRSG TEGADERM 2-3/8X2-3/4 SM (GAUZE/BANDAGES/DRESSINGS) ×4 IMPLANT
DRSG TELFA 3X8 NADH (GAUZE/BANDAGES/DRESSINGS) ×4 IMPLANT
DURAPREP 6ML APPLICATOR 50/CS (WOUND CARE) ×4 IMPLANT
ELECT REM PT RETURN 9FT ADLT (ELECTROSURGICAL) ×4
ELECTRODE REM PT RTRN 9FT ADLT (ELECTROSURGICAL) ×2 IMPLANT
EVACUATOR 1/8 PVC DRAIN (DRAIN) IMPLANT
EVACUATOR SILICONE 100CC (DRAIN) IMPLANT
GAUZE 4X4 16PLY RFD (DISPOSABLE) IMPLANT
GAUZE SPONGE 4X4 12PLY STRL (GAUZE/BANDAGES/DRESSINGS) IMPLANT
GLOVE BIO SURGEON STRL SZ8 (GLOVE) ×4 IMPLANT
GLOVE BIOGEL PI IND STRL 6.5 (GLOVE) ×2 IMPLANT
GLOVE BIOGEL PI IND STRL 7.0 (GLOVE) ×4 IMPLANT
GLOVE BIOGEL PI IND STRL 7.5 (GLOVE) ×4 IMPLANT
GLOVE BIOGEL PI IND STRL 8 (GLOVE) ×2 IMPLANT
GLOVE BIOGEL PI IND STRL 8.5 (GLOVE) ×2 IMPLANT
GLOVE BIOGEL PI INDICATOR 6.5 (GLOVE) ×2
GLOVE BIOGEL PI INDICATOR 7.0 (GLOVE) ×4
GLOVE BIOGEL PI INDICATOR 7.5 (GLOVE) ×4
GLOVE BIOGEL PI INDICATOR 8 (GLOVE) ×2
GLOVE BIOGEL PI INDICATOR 8.5 (GLOVE) ×2
GLOVE ECLIPSE 8.0 STRL XLNG CF (GLOVE) ×4 IMPLANT
GLOVE EXAM NITRILE XL STR (GLOVE) IMPLANT
GLOVE SURG SS PI 6.5 STRL IVOR (GLOVE) ×4 IMPLANT
GLOVE SURG SS PI 7.0 STRL IVOR (GLOVE) ×4 IMPLANT
GOWN STRL REUS W/ TWL LRG LVL3 (GOWN DISPOSABLE) ×4 IMPLANT
GOWN STRL REUS W/ TWL XL LVL3 (GOWN DISPOSABLE) ×4 IMPLANT
GOWN STRL REUS W/TWL 2XL LVL3 (GOWN DISPOSABLE) ×4 IMPLANT
GOWN STRL REUS W/TWL LRG LVL3 (GOWN DISPOSABLE) ×4
GOWN STRL REUS W/TWL XL LVL3 (GOWN DISPOSABLE) ×4
HEMOSTAT POWDER KIT SURGIFOAM (HEMOSTASIS) ×4 IMPLANT
HEMOSTAT SURGICEL 2X14 (HEMOSTASIS) IMPLANT
KIT BASIN OR (CUSTOM PROCEDURE TRAY) ×4 IMPLANT
KIT TURNOVER KIT B (KITS) ×4 IMPLANT
MARKER SKIN DUAL TIP RULER LAB (MISCELLANEOUS) ×4 IMPLANT
MARKER SPHERE PSV REFLC 13MM (MARKER) ×8 IMPLANT
MESH PRE-CONTOURED 40MM 0.6MM (Mesh General) ×4 IMPLANT
NEEDLE HYPO 25X1 1.5 SAFETY (NEEDLE) ×4 IMPLANT
NS IRRIG 1000ML POUR BTL (IV SOLUTION) ×8 IMPLANT
OIL CARTRIDGE MAESTRO DRILL (MISCELLANEOUS) ×4
PACK CRANIOTOMY CUSTOM (CUSTOM PROCEDURE TRAY) ×4 IMPLANT
PAD ARMBOARD 7.5X6 YLW CONV (MISCELLANEOUS) ×12 IMPLANT
PATTIES SURGICAL .25X.25 (GAUZE/BANDAGES/DRESSINGS) IMPLANT
PATTIES SURGICAL .5 X.5 (GAUZE/BANDAGES/DRESSINGS) IMPLANT
PATTIES SURGICAL .5 X3 (DISPOSABLE) IMPLANT
PATTIES SURGICAL 1/4 X 3 (GAUZE/BANDAGES/DRESSINGS) IMPLANT
PATTIES SURGICAL 1X1 (DISPOSABLE) IMPLANT
PIN MAYFIELD SKULL DISP (PIN) ×4 IMPLANT
RUBBERBAND STERILE (MISCELLANEOUS) IMPLANT
SCREW SELF DRILL HT 1.5/4MM (Screw) ×32 IMPLANT
SPECIMEN JAR SMALL (MISCELLANEOUS) ×4 IMPLANT
SPONGE NEURO XRAY DETECT 1X3 (DISPOSABLE) IMPLANT
SPONGE SURGIFOAM ABS GEL 100 (HEMOSTASIS) ×4 IMPLANT
STAPLER SKIN PROX WIDE 3.9 (STAPLE) ×4 IMPLANT
SUT ETHILON 3 0 FSL (SUTURE) IMPLANT
SUT NURALON 4 0 TR CR/8 (SUTURE) ×8 IMPLANT
SUT SILK 2 0 PERMA HAND 18 BK (SUTURE) IMPLANT
SUT VIC AB 2-0 CP2 18 (SUTURE) ×4 IMPLANT
SYR CONTROL 10ML LL (SYRINGE) ×4 IMPLANT
TOWEL GREEN STERILE (TOWEL DISPOSABLE) ×4 IMPLANT
TOWEL GREEN STERILE FF (TOWEL DISPOSABLE) ×4 IMPLANT
TRAY FOLEY MTR SLVR 16FR STAT (SET/KITS/TRAYS/PACK) ×4 IMPLANT
TUBE CONNECTING 12'X1/4 (SUCTIONS) ×1
TUBE CONNECTING 12X1/4 (SUCTIONS) ×3 IMPLANT
UNDERPAD 30X30 (UNDERPADS AND DIAPERS) IMPLANT
WATER STERILE IRR 1000ML POUR (IV SOLUTION) ×4 IMPLANT

## 2018-06-01 NOTE — Brief Op Note (Signed)
06/01/2018  1:24 PM  PATIENT:  Sarah [name]  53 y.o. 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  PROCEDURE:  Procedure(s): Craniectomy for skull metastasis with Brainlab (N/A) APPLICATION OF CRANIAL NAVIGATION (N/A)  SURGEON:  Surgeon(s) and Role:    Erline Levine, MD - Primary    * Earnie Larsson, MD - Assisting  PHYSICIAN ASSISTANT:   ASSISTANTS: Poteat, RN   ANESTHESIA:   general  EBL: Minimal  BLOOD ADMINISTERED:none  DRAINS: none   LOCAL MEDICATIONS USED:  MARCAINE    and LIDOCAINE   SPECIMEN:  Excision  DISPOSITION OF SPECIMEN:  PATHOLOGY  COUNTS:  YES  TOURNIQUET:  * No tourniquets in log *  DICTATION: [name] 53 year old woman with known thyroid cancer, from which she had a thoracic metastasis, from which she became paraplegic.  She now has a large skull metastasis. It was elected to take her to surgery for craniotomy for left parieto-occipital metastasis.  She had preop MRI for use of Curve for surgical localization of tumor.  Procedure:  Following smooth intubation, patient was placed in right semi-lateral position with blanket roll.  Head was placed in pins and left parieto-occipital scalp was shaved and prepped and draped in usual sterile fashion after Curve MRI was localized to map tumor location.  Area of planned incision was infiltrated with lidocaine. A linear parasagittal incision was made and carried through temporalis fascia and muscle to expose calvarium.  Tumor was localized and Skull flap was elevated exposing the dura directly overlying the brain mass.  Dura was opened.  A corticotomy was created overlying the tumor and carried to remove the large metastasis with generous margin.  The Curve was used to confirm extent of tumor resection.  There was metastatic tumor through inner table of skull and adherent to dura, which  was thickened.  Hemostasis was assured with irrigation, Surgifoam and gelfoam. A titanium mesh cranioplasty was fashioned to repair skull defect.  This was placed with screws.   The fascia and galea were closed with 2-0 vicryl sutures and the skin was re approximated with staples.  A sterile occlusive dressing was placed.  Patient was returned to a supine position and taken out of head pins, then extubated in the operating room, having tolerated surgery well.  Counts were correct at the end of the case.  PLAN OF CARE: Admit to inpatient   PATIENT DISPOSITION:  PACU - hemodynamically stable.   Delay start of Pharmacological VTE agent (>24hrs) due to surgical blood loss or risk of bleeding: yes

## 2018-06-01 NOTE — Anesthesia Postprocedure Evaluation (Signed)
Anesthesia Post Note  Patient: Sarah [name]  Procedure(s) Performed: Craniectomy for skull metastasis with Brainlab (N/A Head) APPLICATION OF CRANIAL NAVIGATION (N/A )     Patient location during evaluation: PACU Anesthesia Type: General Level of consciousness: awake Pain management: pain level controlled Vital Signs Assessment: post-procedure vital signs reviewed and stable Respiratory status: spontaneous breathing, nonlabored ventilation, respiratory function stable and patient connected to nasal cannula oxygen Cardiovascular status: blood pressure returned to baseline and stable Postop Assessment: no apparent nausea or vomiting Anesthetic complications: no    Last Vitals:  Vitals:   06/01/18 1510 06/01/18 1530  BP: 124/75 103/67  Pulse: 60 (!) 58  Resp: 13 13  Temp: (!) 36.2 C   SpO2: 98% 97%    Last Pain:  Vitals:   06/01/18 1510  TempSrc:   PainSc: 0-No pain                 Octa Uplinger P Ashtynn Berke

## 2018-06-01 NOTE — Interval H&P Note (Signed)
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  The various methods of treatment have been discussed with the patient and family. After consideration of risks, benefits and other options for treatment, the patient has consented to  Procedure(s) with comments: Craniectomy for skull metastasis with Brainlab (N/A) - Craniectomy for skull metastasis with [place] (N/A) as a surgical intervention.  The patient's history has been reviewed, patient examined, no change in status, stable for surgery.  I have reviewed the patient's chart and labs.  Questions were answered to the patient's satisfaction.     Peggyann Shoals

## 2018-06-01 NOTE — Plan of Care (Signed)
  Problem: Education: Goal: Knowledge of General Education information will improve Description Including pain rating scale, medication(s)/side effects and non-pharmacologic comfort measures Outcome: Progressing   Problem: Health Behavior/Discharge Planning: Goal: Ability to manage health-related needs will improve Outcome: Progressing   Problem: Clinical Measurements: Goal: Ability to maintain clinical measurements within normal limits will improve Outcome: Progressing Goal: Will remain free from infection Outcome: Progressing Goal: Diagnostic test results will improve Outcome: Progressing Goal: Respiratory complications will improve Outcome: Progressing Goal: Cardiovascular complication will be avoided Outcome: Progressing   Problem: Activity: Goal: Risk for activity intolerance will decrease Outcome: Progressing   Problem: Coping: Goal: Level of anxiety will decrease Outcome: Progressing   Problem: Elimination: Goal: Will not experience complications related to bowel motility Outcome: Progressing Goal: Will not experience complications related to urinary retention Outcome: Progressing   Problem: Pain Managment: Goal: General experience of comfort will improve Outcome: Progressing   Problem: Safety: Goal: Ability to remain free from injury will improve Outcome: Progressing   Problem: Skin Integrity: Goal: Risk for impaired skin integrity will decrease Outcome: Progressing   Problem: Education: Goal: Knowledge of the prescribed therapeutic regimen will improve Outcome: Progressing   Problem: Nutrition: Goal: Maintenance of adequate nutrition will improve Outcome: Progressing   Problem: Clinical Measurements: Goal: Complications related to the disease process, condition or treatment will be avoided or minimized Outcome: Progressing   Problem: Respiratory: Goal: Will regain and/or maintain adequate ventilation Outcome: Progressing   Problem: Skin  Integrity: Goal: Demonstration of wound healing without infection will improve Outcome: Progressing

## 2018-06-01 NOTE — Progress Notes (Signed)
Awake, alert, conversant.  No new weakness.  Dressing CDI.  No discomfort.  [name] doing well.

## 2018-06-01 NOTE — Anesthesia Procedure Notes (Signed)
Procedure Name: Intubation Date/Time: 06/01/2018 12:06 PM Performed by: Alain Marion, CRNA Pre-anesthesia Checklist: Patient identified, Emergency Drugs available, Suction available and Patient being monitored Patient Re-evaluated:Patient Re-evaluated prior to induction Oxygen Delivery Method: Circle System Utilized Preoxygenation: Pre-oxygenation with 100% oxygen Induction Type: IV induction Ventilation: Mask ventilation without difficulty Laryngoscope Size: Miller and 2 Grade View: Grade I Tube type: Oral Tube size: 7.0 mm Number of attempts: 1 Airway Equipment and Method: Stylet and Oral airway Placement Confirmation: ETT inserted through vocal cords under direct vision,  positive ETCO2 and breath sounds checked- equal and bilateral Secured at: 23 cm Tube secured with: Tape Dental Injury: Teeth and Oropharynx as per pre-operative assessment

## 2018-06-01 NOTE — Transfer of Care (Signed)
Immediate Anesthesia Transfer of Care Note  Patient: Sarah [name]  Procedure(s) Performed: Craniectomy for skull metastasis with Brainlab (N/A Head) APPLICATION OF CRANIAL NAVIGATION (N/A )  Patient Location: PACU  Anesthesia Type:General  Level of Consciousness: awake, alert  and oriented  Airway & Oxygen Therapy: Patient Spontanous Breathing and Patient connected to nasal cannula oxygen  Post-op Assessment: Report given to RN and Post -op Vital signs reviewed and stable  Post vital signs: Reviewed and stable  Last Vitals:  Vitals Value Taken Time  BP 118/71   Temp    Pulse 76   Resp 12   SpO2 97     Last Pain:  Vitals:   06/01/18 1110  TempSrc:   PainSc: 3          Complications: No apparent anesthesia complications

## 2018-06-01 NOTE — Op Note (Signed)
06/01/2018  1:24 PM  PATIENT:  Sarah [name]  53 y.o. 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  PROCEDURE:  Procedure(s): Craniectomy for skull metastasis with Brainlab (N/A) APPLICATION OF CRANIAL NAVIGATION (N/A)  SURGEON:  Surgeon(s) and Role:    Erline Levine, MD - Primary    * Earnie Larsson, MD - Assisting  PHYSICIAN ASSISTANT:   ASSISTANTS: Poteat, RN   ANESTHESIA:   general  EBL: Minimal  BLOOD ADMINISTERED:none  DRAINS: none   LOCAL MEDICATIONS USED:  MARCAINE    and LIDOCAINE   SPECIMEN:  Excision  DISPOSITION OF SPECIMEN:  PATHOLOGY  COUNTS:  YES  TOURNIQUET:  * No tourniquets in log *  DICTATION: [name] 53 year old woman with known thyroid cancer, from which she had a thoracic metastasis, from which she became paraplegic.  She now has a large skull metastasis. It was elected to take her to surgery for craniotomy for left parieto-occipital metastasis.  She had preop MRI for use of Curve for surgical localization of tumor.  Procedure:  Following smooth intubation, patient was placed in right semi-lateral position with blanket roll.  Head was placed in pins and left parieto-occipital scalp was shaved and prepped and draped in usual sterile fashion after Curve MRI was localized to map tumor location.  Area of planned incision was infiltrated with lidocaine. A linear parasagittal incision was made and carried through temporalis fascia and muscle to expose calvarium.  Tumor was localized and Skull flap was elevated exposing the dura directly overlying the brain mass.  Dura was opened.  A corticotomy was created overlying the tumor and carried to remove the large metastasis with generous margin.  The Curve was used to confirm extent of tumor resection.  There was metastatic tumor through inner table of skull and adherent to dura, which  was thickened.  Hemostasis was assured with irrigation, Surgifoam and gelfoam. A titanium mesh cranioplasty was fashioned to repair skull defect.  This was placed with screws.   The fascia and galea were closed with 2-0 vicryl sutures and the skin was re approximated with staples.  A sterile occlusive dressing was placed.  Patient was returned to a supine position and taken out of head pins, then extubated in the operating room, having tolerated surgery well.  Counts were correct at the end of the case.  PLAN OF CARE: Admit to inpatient   PATIENT DISPOSITION:  PACU - hemodynamically stable.   Delay start of Pharmacological VTE agent (>24hrs) due to surgical blood loss or risk of bleeding: yes

## 2018-06-01 NOTE — Plan of Care (Signed)
  Problem: Education: Goal: Knowledge of General Education information will improve Description Including pain rating scale, medication(s)/side effects and non-pharmacologic comfort measures Outcome: Progressing   Problem: Clinical Measurements: Goal: Respiratory complications will improve Outcome: Progressing Goal: Cardiovascular complication will be avoided Outcome: Progressing   Problem: Activity: Goal: Risk for activity intolerance will decrease Outcome: Progressing   Problem: Nutrition: Goal: Adequate nutrition will be maintained Outcome: Progressing   Problem: Elimination: Goal: Will not experience complications related to bowel motility Outcome: Progressing Goal: Will not experience complications related to urinary retention Outcome: Progressing   Problem: Pain Managment: Goal: General experience of comfort will improve Outcome: Progressing   Problem: Education: Goal: Knowledge of the prescribed therapeutic regimen will improve Outcome: Progressing   Problem: Nutrition: Goal: Maintenance of adequate nutrition will improve Outcome: Progressing   Problem: Clinical Measurements: Goal: Complications related to the disease process, condition or treatment will be avoided or minimized Outcome: Progressing

## 2018-06-02 ENCOUNTER — Other Ambulatory Visit: Payer: Self-pay

## 2018-06-02 ENCOUNTER — Encounter: Payer: Medicare Other | Admitting: Physical Medicine & Rehabilitation

## 2018-06-02 ENCOUNTER — Encounter (HOSPITAL_COMMUNITY): Payer: Self-pay | Admitting: Neurosurgery

## 2018-06-02 MED ORDER — MUPIROCIN 2 % EX OINT
1.0000 "application " | TOPICAL_OINTMENT | Freq: Two times a day (BID) | CUTANEOUS | Status: DC
  Administered 2018-06-02: 1 via NASAL
  Filled 2018-06-02: qty 22

## 2018-06-02 MED ORDER — CHLORHEXIDINE GLUCONATE CLOTH 2 % EX PADS
6.0000 | MEDICATED_PAD | Freq: Every day | CUTANEOUS | Status: DC
  Administered 2018-06-02: 6 via TOPICAL

## 2018-06-02 MED ORDER — HYDROCODONE-ACETAMINOPHEN 5-325 MG PO TABS: 1.0000 | ORAL_TABLET | ORAL | 0 refills | Status: DC | PRN

## 2018-06-02 NOTE — Discharge Summary (Signed)
Physician Discharge Summary  Patient ID: HIBAH ODONNELL MRN: [id number] DOB/AGE: 12-22-1965 53 y.o.  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 Following uncomplicated craniectomy, she recovered well and transferred to Neuro ICU for nursing care. She is doing well.  Consults: None  Significant Diagnostic Studies:   Treatments: surgery: Craniectomy for skull metastasis with Brainlab (N/A) APPLICATION OF CRANIAL NAVIGATION (N/A)   Discharge Exam: Blood pressure 122/69, pulse 68, temperature 98.5 F (36.9 C), temperature source Oral, resp. rate 16, height 5\' 11"  (1.803 m), weight 97.5 kg, last menstrual period 10/22/2016, SpO2 100 %. Alert, conversant, eating breakfast. Reports mild head pain intermittently through the night. No new deficits. Drsg bloody, without active drainage or swelling. Cleansing site reveals well-approximated incison without erythema.    Disposition:  Discharge to home. She should resume her Xarelto on March 14th. Norco 5/325 will be eRx'ed for prn home use. Office f/u in 2 weeks for staple removal. Ok to leave incision open to air. Ok to wash hair with mild shampoo, special care around staples.    Allergies as of 06/02/2018      Reactions   Bee Venom Anaphylaxis   Heparin Other (See Comments)   Weak positive platelets induced antibodies, SRA negative--2015   Keflex [cephalexin] Nausea And Vomiting   Tramadol Nausea And Vomiting   Gabapentin Nausea And Vomiting   Hydrochlorothiazide  Nausea And Vomiting   Hydrocodone Nausea And Vomiting   Oxycodone-acetaminophen Nausea And Vomiting   Sorbitol Nausea Only   And abdominal distress   Tizanidine    Fatigue      Medication List    STOP taking these medications   rivaroxaban 20 MG Tabs tablet Commonly known as:  XARELTO     TAKE these medications   acetaminophen 325 MG tablet Commonly known as:  TYLENOL Take 2 tablets (650 mg total) by mouth every 4 (four) hours as needed for mild pain ((score 1 to 3) or temp > 100.5).   albuterol (2.5 MG/3ML) 0.083% nebulizer solution Commonly known as:  PROVENTIL Take 3 mLs (2.5 mg total) by nebulization every 6 (six) hours as needed for wheezing or shortness of breath.   amoxicillin-clavulanate 875-125 MG tablet Commonly known as:  AUGMENTIN Take 1 tablet by mouth every 12 (twelve) hours.   baclofen 10 MG tablet Commonly known as:  LIORESAL Take 2.5 tablets (25 mg total) by mouth 4 (four) times daily as needed for muscle spasms. What changed:    how much to take  when to take this   furosemide 40 MG tablet Commonly known as:  LASIX Take 1 tablet (40 mg total) by mouth every other day.   HYDROcodone-acetaminophen 5-325 MG tablet Commonly known as:  NORCO/VICODIN Take 1 tablet by mouth every 4 (four) hours as needed for moderate pain.   ipratropium-albuterol 0.5-2.5 (3) MG/3ML Soln Commonly known as:  DUONEB Take 3 mLs by nebulization 3 (three) times daily.   levothyroxine 150 MCG tablet Commonly known as:  SYNTHROID, LEVOTHROID Take 150 mcg by mouth daily before breakfast.   LORazepam 1 MG tablet Commonly known as:  ATIVAN Take 1 tablet (1 mg total) by mouth as needed for anxiety (30 minutes prior to MRI and may repeat once if needed).   multivitamin with minerals Tabs tablet Take 1 tablet by mouth daily.   ondansetron 4 MG tablet Commonly known as:  ZOFRAN Take 1 tablet by mouth every 6 (six) hours as needed for nausea or vomiting.   oxyCODONE 5 MG  immediate release tablet Commonly known as:  Roxicodone Take 1 tablet (5 mg total) by mouth every 6 (six) hours as needed for severe pain.   pantoprazole 40 MG tablet Commonly known as:  PROTONIX Take 1 tablet by mouth daily.   polyethylene glycol packet Commonly known as:  MIRALAX / GLYCOLAX Take 17 g by mouth daily.   senna 8.6 MG Tabs tablet Commonly known as:  SENOKOT Take 1 tablet (8.6 mg total) by mouth at bedtime as needed for mild constipation.   sertraline 50 MG tablet Commonly known as:  ZOLOFT Take 50 mg by mouth daily.        Signed: Peggyann Shoals, MD 06/02/2018, 8:44 AM

## 2018-06-02 NOTE — Progress Notes (Addendum)
Subjective: Patient reports "It (head) hurt a little overnight, but not now"  Objective: Vital signs in last 24 hours: Temp:  [97 F (36.1 C)-98.5 F (36.9 C)] 98.5 F (36.9 C) (03/11 0400) Pulse Rate:  [53-86] 68 (03/11 0500) Resp:  [0-22] 16 (03/11 0500) BP: (103-152)/(48-93) 122/69 (03/11 0500) SpO2:  [94 %-100 %] 94 % (03/11 0500) Weight:  [97.5 kg] 97.5 kg (03/10 1530)  Intake/Output from previous day: 03/10 0701 - 03/11 0700 In: 2208 [I.V.:1733; IV Piggyback:200] Out: 1300 [Urine:1200; Blood:100] Intake/Output this shift: No intake/output data recorded.  Alert, conversant, eating breakfast. Reports mild head pain intermittently through the night. No new deficits. Drsg bloody, without active drainage or swelling. Cleansing site reveals well-approximated incison without erythema.   Lab Results: No results for input(s): WBC, HGB, HCT, PLT in the last 72 hours. BMET No results for input(s): NA, K, CL, CO2, GLUCOSE, BUN, CREATININE, CALCIUM in the last 72 hours.  Studies/Results: No results found.  Assessment/Plan: Doing well day 1 post-op  LOS: 1 day  Dressing change with saline to cleanse, Telfa and Tegaderm to cover. OK to leave open to air if not draining/bleeding. Will discuss d/c to home and date for Xarelto resumption with Dr. Vertell Limber this am.   Verdis Prime 06/02/2018, 7:54 AM   [name] doing well.  Discharge home.

## 2018-06-02 NOTE — Progress Notes (Addendum)
Patient ID: Sarah [name], female   DOB: [birth year], 53 y.o.   MRN: [id number] [place] per DrStern to d/c to home. She should resume her Xarelto on March 14th. Norco 5/325 will be eRx'ed for prn home use. Office f/u in 2 weeks for staple removal.  [name] doing well.  Discharge home.

## 2018-06-02 NOTE — Care Management Note (Signed)
Case Management Note  Patient Details  Name: Sarah [name] MRN: [id number] Date of Birth: [birth year]  Subjective/Objective:   53 yr old female s/p craniectomy.                Action/Plan: Case manager spoke with patient and her family concerning discharge plan and DME. Patient has all her equipment at home, requests a hoyer lift. Has recently purchased a new Lucianne Lei so she does not need PTAR transportation. Has great family support system.    Expected Discharge Date:  06/02/18               Expected Discharge Plan:  Home/Self Care  In-House Referral:  NA  Discharge planning Services  CM Consult  Post Acute Care Choice:  Durable Medical Equipment Choice offered to:  Patient  DME Arranged:  Harrel Lemon Lift) DME Agency:  [facility]  HH Arranged:  NA HH Agency:  NA  Status of Service:  Completed, signed off  If discussed at [place] of Stay Meetings, dates discussed:    Additional Comments:  Ninfa Meeker, RN 06/02/2018, 1:13 PM

## 2018-06-02 NOTE — Care Management (Deleted)
Extreme risk for readmission note.  [name] paraplegic, s/p craniotomy. Has malignant neoplasm with mets to bone of skull and dura, per MD notes.       Ricki Miller, RN BSN  Case Manager [phone]

## 2018-06-04 DIAGNOSIS — C7931 Secondary malignant neoplasm of brain: Secondary | ICD-10-CM | POA: Diagnosis not present

## 2018-06-08 ENCOUNTER — Other Ambulatory Visit: Payer: Self-pay | Admitting: Radiation Therapy

## 2018-06-08 DIAGNOSIS — C7949 Secondary malignant neoplasm of other parts of nervous system: Principal | ICD-10-CM

## 2018-06-08 DIAGNOSIS — C7931 Secondary malignant neoplasm of brain: Secondary | ICD-10-CM

## 2018-06-10 LAB — TYPE AND SCREEN
ABO/RH(D): O POS
ANTIBODY SCREEN: NEGATIVE
Unit division: 0
Unit division: 0

## 2018-06-10 LAB — BPAM RBC
Blood Product Expiration Date: 202004042359
Blood Product Expiration Date: 202004042359
Unit Type and Rh: 5100
Unit Type and Rh: 5100

## 2018-06-14 ENCOUNTER — Encounter: Payer: Self-pay | Admitting: Physical Therapy

## 2018-06-14 ENCOUNTER — Telehealth: Payer: Self-pay | Admitting: Physical Medicine & Rehabilitation

## 2018-06-14 MED ORDER — BACLOFEN 20 MG PO TABS: 20.0000 mg | ORAL_TABLET | Freq: Two times a day (BID) | ORAL | 4 refills | Status: DC

## 2018-06-14 NOTE — Telephone Encounter (Signed)
I sent an Epic message to Dr. Naaman Plummer.  He states it is okay to reschedule tomorrows appointment, follow up in two months. Appointment has been rescheduled

## 2018-06-14 NOTE — Telephone Encounter (Signed)
Nursing home took her off baclofen --her legs are currently so swollen she can't touch...[name] KitchenMarland KitchenMarland Kitchenwould like to not come to appt -  - can you respond about refilling RX for baclofen- does she need to follow up earlier or wait? Can you call in as you've not seen since July/august

## 2018-06-14 NOTE — Telephone Encounter (Signed)
Baclofen written for 20mg  bid #60

## 2018-06-14 NOTE — Therapy (Signed)
[facility] [address] [place], [place], [address] Phone: [phone]   Fax:  [phone]  Patient Details  Name: Sarah [name] MRN: [id number] Date of Birth: [birth year] Referring Provider:  No ref. provider found  Encounter Date: 06/14/2018                                                                                                                                                        PT DISCHARGE SUMMARY  PT Long Term Goals - 08/05/17 1108      PT LONG TERM GOAL #1   Title  Pt will transfer sit to stand from wheelchair to RW with CGA.  (all goals due 09/05/17)    Baseline  07/31/17; needed at least min assist to mod assist depending on fatigue    Time  4    Period  Weeks    Status  Not Met      PT LONG TERM GOAL #2   Title  Stand at countertop modified independently for at least 5" per pt report to demo increased LE strength and improved standing tolerance (GOAL ONGOING)    Baseline  07/31/17: able to stand for 5 minutes at sink with supervision degressing to min guard assist as pt fatigued for safety    Time  4    Period  Weeks    Status  Partially Met      PT LONG TERM GOAL #3   Title  Amb. with RW 25' in home with +1 min assist.     Baseline  Pt currently not amb. in home    Time  4    Period  Weeks    Status  New      Pt has not attended PT since 08-05-17 due to change in medical status.           Alda Lea, PT 06/14/2018, 11:54 AM  [facility] [address] [place], [place], [address] Phone: [phone]   Fax:  [phone]

## 2018-06-15 ENCOUNTER — Encounter: Payer: Medicare Other | Admitting: Physical Medicine & Rehabilitation

## 2018-06-17 ENCOUNTER — Encounter: Payer: Self-pay | Admitting: Radiation Oncology

## 2018-06-17 ENCOUNTER — Other Ambulatory Visit: Payer: Self-pay

## 2018-06-17 ENCOUNTER — Ambulatory Visit (HOSPITAL_COMMUNITY)
Admission: RE | Admit: 2018-06-17 | Discharge: 2018-06-17 | Disposition: A | Discharge status: Home / Self Care | Payer: Medicare Other | Source: Ambulatory Visit | Attending: Radiation Oncology | Admitting: Radiation Oncology

## 2018-06-17 DIAGNOSIS — C73 Malignant neoplasm of thyroid gland: Secondary | ICD-10-CM | POA: Diagnosis not present

## 2018-06-17 DIAGNOSIS — C7931 Secondary malignant neoplasm of brain: Secondary | ICD-10-CM | POA: Diagnosis not present

## 2018-06-17 DIAGNOSIS — C7949 Secondary malignant neoplasm of other parts of nervous system: Secondary | ICD-10-CM | POA: Insufficient documentation

## 2018-06-17 MED ORDER — GADOBUTROL 1 MMOL/ML IV SOLN
10.0000 mL | Freq: Once | INTRAVENOUS | Status: AC | PRN
  Administered 2018-06-17: 10 mL via INTRAVENOUS

## 2018-06-17 NOTE — Progress Notes (Signed)
Precharting. Simulation cancelled for 06/18/2018 and rescheduled for a later date.

## 2018-06-18 ENCOUNTER — Ambulatory Visit
Admission: RE | Admit: 2018-06-18 | Discharge: 2018-06-18 | Disposition: A | Discharge status: Home / Self Care | Payer: Medicare Other | Source: Ambulatory Visit | Attending: Radiation Oncology | Admitting: Radiation Oncology

## 2018-06-18 ENCOUNTER — Ambulatory Visit: Admission: RE | Admit: 2018-06-18 | Payer: Medicare Other | Source: Ambulatory Visit | Admitting: Radiation Oncology

## 2018-06-18 ENCOUNTER — Telehealth: Payer: Self-pay | Admitting: Radiation Therapy

## 2018-06-18 ENCOUNTER — Ambulatory Visit: Payer: Medicare Other

## 2018-06-18 ENCOUNTER — Telehealth (INDEPENDENT_AMBULATORY_CARE_PROVIDER_SITE_OTHER): Payer: Medicare Other | Admitting: Family Medicine

## 2018-06-18 ENCOUNTER — Ambulatory Visit: Admission: RE | Admit: 2018-06-18 | Payer: Medicare Other | Source: Ambulatory Visit | Admitting: Urology

## 2018-06-18 DIAGNOSIS — H6983 Other specified disorders of Eustachian tube, bilateral: Secondary | ICD-10-CM | POA: Diagnosis not present

## 2018-06-18 MED ORDER — OXYMETAZOLINE HCL 0.05 % NA SOLN: 1.0000 | Freq: Two times a day (BID) | NASAL | 0 refills | Status: DC

## 2018-06-18 MED ORDER — CETIRIZINE HCL 10 MG PO TABS: 10.0000 mg | ORAL_TABLET | Freq: Every day | ORAL | 11 refills | Status: DC

## 2018-06-18 MED ORDER — PREDNISONE 20 MG PO TABS: 40.0000 mg | ORAL_TABLET | Freq: Every day | ORAL | 0 refills | Status: AC

## 2018-06-18 NOTE — Progress Notes (Signed)
[place] Telemedicine Visit  Patient consented to have visit conducted via telephone.  Encounter participants: Patient: Sarah [name]  Provider: Bonnita Hollow  Others (if applicable):   Chief Complaint: bilateral otalgia  HPI:  Patient has been having ear pain in both ears, although, not always at the same time. L > R. Several weeks occurrence. Was seen in the ED for acute maxillary sinusitis. Was given Augmentin. Patient was also using fluticasone nasal spray. Some improvement with this but pain has not resolved. Patient denies ongoing fevers and chills. No runny nose. Mild cough. Has Post-nasal drip. Had Brain MRI on 3/26 that did not show any Eustachian tube pathology or tumor pathology. Has also tried saline spray.   ROS: As HPI  Pertinent PMHx: Recent craniectomy for Hurthle cell carcinoma.   Assessment/Plan: Eustachian tube dysfunction. Tried some medical therapies without improvement. Plan to trial systemic steroid course for 5 days + nasal decongestant 3 days + antihistamine daily. If no improvement in 1 week, call us back and will likely referral ENT. Will forward to PCP.    Time spent on phone with patient: 15 minutes

## 2018-06-18 NOTE — Telephone Encounter (Signed)
I spoke with Sarah [name] today about the upcoming visits we have planned for her post-operative radiation treatment. Dr. [name] feels that she would benefit from putting things on hold for a few months to allow things to settle down and to decrease her risk of exposure here and in the community.  We will plan to re-scan in 3 months and regroup at that time.   Sarah [name] was very happy with this plan and relieved that we reached out to delay this treatment course.    Mont Dutton R.T.(R)(T) Special Procedures Navigator

## 2018-06-22 ENCOUNTER — Other Ambulatory Visit: Payer: Self-pay | Admitting: Radiation Therapy

## 2018-06-22 ENCOUNTER — Ambulatory Visit: Payer: Medicare Other | Admitting: Radiation Oncology

## 2018-06-22 DIAGNOSIS — C7931 Secondary malignant neoplasm of brain: Secondary | ICD-10-CM

## 2018-06-22 DIAGNOSIS — C7949 Secondary malignant neoplasm of other parts of nervous system: Principal | ICD-10-CM

## 2018-06-30 ENCOUNTER — Telehealth (INDEPENDENT_AMBULATORY_CARE_PROVIDER_SITE_OTHER): Payer: Medicare Other | Admitting: Family Medicine

## 2018-06-30 DIAGNOSIS — N390 Urinary tract infection, site not specified: Secondary | ICD-10-CM

## 2018-06-30 DIAGNOSIS — H6992 Unspecified Eustachian tube disorder, left ear: Secondary | ICD-10-CM | POA: Diagnosis not present

## 2018-06-30 MED ORDER — NITROFURANTOIN MACROCRYSTAL 100 MG PO CAPS: 100.0000 mg | ORAL_CAPSULE | Freq: Two times a day (BID) | ORAL | 0 refills | Status: DC

## 2018-06-30 MED ORDER — FLUTICASONE PROPIONATE 50 MCG/ACT NA SUSP: 2.0000 | Freq: Every day | NASAL | 6 refills | Status: DC

## 2018-06-30 NOTE — Telephone Encounter (Signed)
Patient called: Consented to telehealth visit. Urine odor.  Paraplegic.  Just uses diapers.  No catheterization.   Does not have much feeling below chest.  Does feel that something is not right.  No fever.  This feels like the typical bladder infection she has had before.   Also complains of left ear pain.  Was treated as an OM by ER with amox or augmentin.  Was in ER in Feb. Ear discomfort has continued to smolder.  No fever.  Known thyroid cancer with mets.    Duration of visit was 14 minutes.

## 2018-06-30 NOTE — Assessment & Plan Note (Addendum)
Rx with flonase Will need visit if does not improve.

## 2018-06-30 NOTE — Assessment & Plan Note (Signed)
Will Rx as uti, lower. Will need visit if does not improve.

## 2018-07-01 ENCOUNTER — Other Ambulatory Visit: Payer: Self-pay | Admitting: Family Medicine

## 2018-07-01 ENCOUNTER — Telehealth: Payer: Self-pay

## 2018-07-01 DIAGNOSIS — G8220 Paraplegia, unspecified: Secondary | ICD-10-CM

## 2018-07-01 NOTE — Telephone Encounter (Signed)
Pt called nurse line requesting orders to be put in for a home health nurse. Pt stated she wears diapers and has developed sores on her bottom. Pt would like for her home health nurse to come from [place]. Please advise. If she needs to be seen first, let me know. I can scheduled her for ATC apt next week.

## 2018-07-01 NOTE — Telephone Encounter (Signed)
Page,   She will not need a clinic visit, I will put in the home health orders.

## 2018-07-05 ENCOUNTER — Other Ambulatory Visit: Payer: Self-pay | Admitting: Physical Medicine & Rehabilitation

## 2018-07-05 DIAGNOSIS — C7931 Secondary malignant neoplasm of brain: Secondary | ICD-10-CM | POA: Diagnosis not present

## 2018-07-06 ENCOUNTER — Telehealth (INDEPENDENT_AMBULATORY_CARE_PROVIDER_SITE_OTHER): Payer: Medicare Other | Admitting: Family Medicine

## 2018-07-06 ENCOUNTER — Other Ambulatory Visit: Payer: Self-pay

## 2018-07-06 DIAGNOSIS — N644 Mastodynia: Secondary | ICD-10-CM | POA: Diagnosis not present

## 2018-07-06 NOTE — Assessment & Plan Note (Signed)
Acute on chronic breast pain x3 weeks.  Reassured as this is bilateral in nature, no evidence of infectious, cardiac, pulmonary etiology.  Patient has been amenorrheic since June 2019 and pain does not appear cyclical in nature.  Advised patient to obtain a sports bra that will provide more support, continue Tylenol, alternate ice and heat throughout the day.  Patient scheduled for follow-up telemedicine appointment in 1 week on 4/21.  Patient advised to call if pain acutely worsens.  Red flag symptoms discussed with patient including infectious symptoms, change in character to unilateral pain, shortness of breath.  Patient voiced understanding of these precautions.  If patient does not have improvement in her pain within the week, would consider mammogram and ultrasound given her age.

## 2018-07-06 NOTE — Progress Notes (Signed)
[place] Telemedicine Visit  Patient consented to have virtual visit. Method of visit: Telephone  Encounter participants: Patient: Sarah [name] - located at home Provider: Cleophas Dunker - located at [facility] Others (if applicable): dicussed with Dr. Okey Dupre, preceptor  Chief Complaint: Bilateral breast pain  HPI: Patient called into clinic today with complaint of bilateral breast pain.  She states that she has had this since June or July 2019, but that it is worsened in the last 3 weeks.  She states that she has noticed the pain since she surgery for thoracic spinal tumor extraction.  She states that since that time she has lost a lot of weight and has gone from DD to size B.  She states that the pain now occurs every day, is worse in the morning, but lasts all day.  She states that at its worst it is a stabbing pain, but is otherwise aching.  She rates the pain 6 out of 10 at worst and 3 out of 10 at present.  She states that she is taking Tylenol every 5 hours with minimal relief.  She also notes that she has some improvement when she lifts her breast.  She states "I am still using my old bras, I know that they are not supportive enough."  She also states that for the last few days she has been avoiding caffeine, but has not seen improvement with this.  She denies any lumps, no increased density or changes to palpation of her breasts aside from decrease in size, no nipple drainage, no redness or erythema, no shortness of breath, no difference in pain between the 2 sides, no fevers.  She states that she typically uses a heating pad at night which sometimes helps.  She is able to sleep and the pain does not keep her up.  She states that she has noticed it worsens with increased activity, but does not always improve with rest.  She is up-to-date on her mammogram with her last one being November 2018, which was also ordered for pulling bilateral breast pain.   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  Reassured as this is bilateral in nature, no evidence of infectious, cardiac, pulmonary etiology.  Patient has been amenorrheic since June 2019 and pain does not appear cyclical in nature.  Advised patient to obtain a sports bra that will provide more support, continue Tylenol, alternate ice and heat throughout the day.  Patient scheduled for follow-up telemedicine appointment in 1 week on 4/21.  Patient advised to call if pain acutely worsens.  Red flag symptoms discussed with patient including infectious symptoms, change in character to unilateral pain, shortness of breath.  Patient voiced understanding of these precautions.  If patient does not have improvement in her pain within the week, would consider mammogram and ultrasound given her age.    Time spent during visit with patient: 16 minutes

## 2018-07-07 ENCOUNTER — Other Ambulatory Visit: Payer: Self-pay | Admitting: Hematology

## 2018-07-09 ENCOUNTER — Other Ambulatory Visit: Payer: Self-pay

## 2018-07-09 ENCOUNTER — Telehealth (INDEPENDENT_AMBULATORY_CARE_PROVIDER_SITE_OTHER): Payer: Medicare Other | Admitting: Family Medicine

## 2018-07-09 DIAGNOSIS — B372 Candidiasis of skin and nail: Secondary | ICD-10-CM

## 2018-07-09 DIAGNOSIS — G8220 Paraplegia, unspecified: Secondary | ICD-10-CM

## 2018-07-09 MED ORDER — NYSTATIN 100000 UNIT/GM EX POWD: Freq: Four times a day (QID) | CUTANEOUS | 3 refills | Status: DC

## 2018-07-09 NOTE — Progress Notes (Signed)
[place] Telemedicine Visit  Patient consented to have virtual visit. Method of visit: Video was attempted, but technology challenges prevented patient from using video, so visit was conducted via telephone.  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  The patient does not have sensation below the level of T4.  She reports that her sister has noticed that her skin is breaking down around her vaginal area.  This started after course of antibiotic therapy in February.  The skin was red and irritated appearing.  This improved considerably but then she received another course of antibiotics several weeks ago and the skin once again is breaking down slightly.  The patient reports she is eating and drinking well.  She denies fevers.  She does not have a urinary catheter in place.  She denies vaginal discharge.  She is not sexually active.    ROS: per HPI  Pertinent PMHx:  Paraplegia Spasticity of the lower extremities Chronic pain Depression  Exam:  Respiratory: Alert oriented speaking in full sentences no respiratory distress  Assessment/Plan:  Diagnoses and all orders for this visit:  Skin breakdown with possible overlying candidal infection in the setting of paraplegia given the patient's complex medical conditions I recommend evaluation of this as soon as possible.  The patient is available for an appointment at the earliest Tuesday.  I reviewed strict return precautions  including but not limited to poor oral intake, nausea, vomiting, bleeding at the site signs of infection or fever.   The patient is aware of this and will present to care over the weekend for this.  I am concerned that this is a more complex wound.  Differential could include pressure ulcer, less likely necrotizing infection given that she feels very well at this time.  I reviewed strict return precautions given my concern with the patient home health order placed as well to see if home health wound care can come to house to help with wound care.  She is scheduled for a visit on Tuesday -     nystatin (MYCOSTATIN/NYSTOP) powder; Apply topically 4 (four) times daily. To groin.   Time spent during visit with patient: 11 minutes  Not billed as scheduled for next available.

## 2018-07-12 ENCOUNTER — Telehealth: Payer: Self-pay

## 2018-07-12 NOTE — Telephone Encounter (Signed)
[  07/12/2018 10:18 AM]  Penelope Galas:   I sent one over on 07/05/18, and I am sending this again now . I will check on it later to make sure someone is contacting her

## 2018-07-12 NOTE — Telephone Encounter (Signed)
Pt called nurse line stating she hasn't heard from home services about sending a home health nurse out. The patient has an apt for tomorrow for ATC for skin break down evaluation, however patient does not have a ride and needs to cancel. I sent a message to our referral coordinator to FU with home health orders, as patient needs to be evaluating by someone.

## 2018-07-13 ENCOUNTER — Other Ambulatory Visit: Payer: Self-pay

## 2018-07-13 ENCOUNTER — Telehealth (INDEPENDENT_AMBULATORY_CARE_PROVIDER_SITE_OTHER): Payer: Medicare Other | Admitting: Family Medicine

## 2018-07-13 ENCOUNTER — Encounter: Payer: Self-pay | Admitting: Family Medicine

## 2018-07-13 ENCOUNTER — Ambulatory Visit: Payer: Medicare Other

## 2018-07-13 DIAGNOSIS — B373 Candidiasis of vulva and vagina: Secondary | ICD-10-CM | POA: Diagnosis not present

## 2018-07-13 DIAGNOSIS — B3731 Acute candidiasis of vulva and vagina: Secondary | ICD-10-CM

## 2018-07-13 DIAGNOSIS — N644 Mastodynia: Secondary | ICD-10-CM

## 2018-07-13 MED ORDER — FLUCONAZOLE 150 MG PO TABS: 150.0000 mg | ORAL_TABLET | Freq: Once | ORAL | 0 refills | Status: AC

## 2018-07-13 NOTE — Assessment & Plan Note (Signed)
Almost resolved, significantly improved with conservative measures.  Will continue to monitor, recommended continued supportive care and routine mammogram follow-up.

## 2018-07-13 NOTE — Progress Notes (Signed)
[facility] Telemedicine Visit  Patient consented to have virtual visit. 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 Sandi Carne for acute on chronic bilateral breast pain. Today she endorses that her breasts are feeling significantly better.  Minimal tenderness remaining.  She has been taking Tylenol intermittently and now wearing better bra support.   Yeast infection: Additionally follow-up from a telemedicine visit with Dr. Owens Shark on 4/17.  Patient endorses vaginal erythema/evidence of skin irritation.  She is not able to feel from the waist down, sister and mother have been helping taking care of her vaginal area and monitoring closely.  States this irritation started after antibiotic use, has had yeast infections in the past like this.  States within her vaginal area her sisters told her there are few areas of "raw looking "skin.  Denies any drainage from these areas.  She was recently prescribed nystatin powder, which she has been using 3/4 times a day with some improvement in appearance.  Denies any fever, rashes, vaginal discharge, nausea, vomiting, abdominal pains.  Post menopausal, not sexually active.  She repositions often in her seat, sister does not feel these appear to be pressure ulcers.  Wears breeze underwear, changes about 5 times in a 24-hour span, will check 2 times during the day.  Had not heard from the home health nurse, Kennyth Lose replaced the wound care order yesterday, 4/20.   ROS: per HPI  Pertinent PMHx: Paraplegia from T4 down, hypertension, neurogenic bladder    Exam:  General: Alert, middle-aged female in no acute distress Respiratory: Speaking in full sentences, breathing comfortably Did not have patient use camera for pelvic inspection.   Assessment/Plan:  Suspected vaginal candidiasis, in setting of paraplegia: Slight improvement. Continued "raw appearance" of vaginal tissue despite using nystatin powders.  Given this seems to be vaginal rather than groin skin regions, will send in a one-time dose of Diflucan to hopefully benefit. While this is likely a vaginal yeast infection, difficult to fully assess without visualization.  Given her complex comorbidities, would truly like to have this evaluated by clinician, however reassured she has felt well otherwise without any concerning systemic symptoms. Less likely to be pressure ulcer given area of concern, additionally patient frequently repositions and monitors her skin on a regular basis. - Diflucan 150 mg - Can continue nystatin powder - Recommended more frequent monitoring of underwear, check every 4 hours if moist, change, to avoid prolonged moist environment - Patient was scheduled to be seen in the office today, however rescheduled to a telemedicine visit due to no transportation and avoiding any possible COVID exposures.  Informed her if this area is not getting any better with these interventions in the next few days, she absolutely needs to be seen in person to evaluate this area further.  Patient endorsed understanding and agreed. - Home health wound nurse referral placed again on 4/20, hopefully will be able to evaluate at home soon - Additional strict precautions discussed  Breast pain Almost resolved, significantly improved with conservative measures.  Will continue to monitor, recommended continued supportive care and routine mammogram follow-up.  Time spent during visit with patient: 15 minutes  Patriciaann Clan, DO

## 2018-07-19 ENCOUNTER — Other Ambulatory Visit: Payer: Self-pay | Admitting: *Deleted

## 2018-07-19 MED ORDER — RIVAROXABAN 20 MG PO TABS: 20.0000 mg | ORAL_TABLET | Freq: Every day | ORAL | 3 refills | Status: AC

## 2018-07-20 NOTE — Progress Notes (Signed)
HEMATOLOGY/ONCOLOGY CLINIC NOTE  Date of Service: 07/21/18   Patient Care Team: [name], MD as PCP - General (Family Medicine) [name], MD as Consulting Physician (Physical Medicine and Rehabilitation) Ashok Pall, MD as Consulting Physician (Neurosurgery) Brunetta Genera, MD as Consulting Physician (Hematology) Jacelyn Pi, MD as Consulting Physician (Endocrinology)   Dayton Bailiff MD (Spine [place]) [phone]. 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  01/09/2014- patient underwent total thyroidectomy by Dr. Elie Goody. Pathology showed Hurthle cell carcinoma with no perineural invasion, presence of lymphovascular invasion and extra thyroidal extension. p T3, p N0, Mx  Received 30 grays in 10 fractions to her C7-T6 lesion finished in November 2015  Received 135 mCi of iodine 131 on 07/21/2014. Whole-body scan was apparently negative.  07/05/2015-07/10/2015 admitted to [facility] for leg weakness. MRI of the T-spine showed recurrent tumor causing spinal cord compression. Patient was emergently taken to the or and underwent a redo thoracic laminectomy of T3 and partial tumor resection.   08/07/2015 PET/CT scan of the chest abdomen pelvis showed slight enlargement of FDG avid left level III and level IV lymph nodes concerning for worsening nodal metastases. Slight enlargement of the hypermetabolic right superior mediastinal mass and multiple metabolically active pulmonary nodules representing worsening metastases. T3 vertebral body and bilateral pedicle metastases status  post posture metallic fusion. No residual uptake in the thyroidectomy bed  Thyroglobulin level was 11.7 (thyroglobulin antibody present 9.8)   HISTORY OF PRESENTING ILLNESS:   Sarah [name] is a wonderful 53 y.o. female who has been referred to [place] by Dr Leamon Arnt (medical oncology at [facility]) for help with local comanagement of the patient's metastatic Hurthle cell thyroid carcinoma with details as noted above. Patient [facility] and was noted to have cord compression from recurrent tumor at T3. She is status post redo thoracic laminectomy of T3 and partial tumor resection to try to decompress the core. Pathology was consistent with metastatic Hurthle cell carcinoma.  She has been on dexamethasone 2 mg by mouth twice a day. Continue to have lower extremity weakness and is currently in a skilled nursing facility getting rehabilitation. She feels her lower extremity weakness is possibly getting somewhat better.  Patient had a repeat PET/CT scan at [facility] on 08/07/2015 as noted above which shows progression of her metastatic disease including nodal metastases , pulmonary nodules and right superior mediastinal mass.  MRI of the spine on 08/14/2015 showed improved but persistent cord compression at T3 level.  She was started by Dr. Leamon Arnt on Lenvatinib and 14 mg by mouth daily for progressive metastatic disease after ruling out the option of repeat surgery with Dr. Cyndy Freeze and reradiation with Dr. Ledon Snare.  After a week or so the patient was increased to a dose of 10m po daily. She notes no diarrhea or other gastrointestinal symptoms. No skin rashes.  Is noted to have oral thrush and was started on nystatin swish and swallow.   Patient has a history of pulmonary embolism  2015 and has continued to be on Xarelto for anticoagulation.   INTERVAL HISTORY  Sarah [name] returns today for a  follow up regarding her Metastatic Hurthle cell thyroid carcinoma. The patient's last visit with [place] was on 05/19/18. The pt reports that she is doing well overall.  The pt reports that her craniotomy went well but she feels that she may still have an ear infection. She has been using flonase and "an allergy pill." She notes that her left ear hurts "on the inside," as do her left sided sinus. She denies fevers. She endorses concern that she had a left ear drum burst in her past. She took antibiotics in February and notes that this helped, but endorses some concern for a vaginal yeast infection which was treated with one round of anti-fungals, but pt believes has returned/persisted.  The pt notes that she will follow up with Rad Onc in 3 months after obtaining an interval MRI Brain. The pt returned to [place] after her craniotomy.  The pt notes that her bilateral breasts have been hurting recently, and endorses concern mass loss in her breasts. She denies worsened pain when breathing deeply and denies rib pain. She notes that she has been taking Tylenol and has been using a heating pad which has helped some.   The pt notes that she continues on 59m Baclofen but continues having back spasms.  The pt notes that her bilateral ankles continue to be swollen, which improve at night. She is not currently taking Lasix.   The pt endorses consuming protein shakes but notes she could be eating better. She denies blood in the stools or other concerns for bleeding.   The pt continues on 1540m Synthroid.  Of note since the patient's last visit, pt has had an MRI Brain completed on 06/17/18 with results revealing "Interval left parietal craniotomy for tumor removal. Dural thickening at the surgical site is unchanged from the prior study. Inferior to this level is an area of dural or leptomeningeal enhancement which is unchanged from the prior study and will need to be followed closely by imaging. Negative for  metastatic disease to the brain parenchyma."  Lab results today (07/21/18) of CBC w/diff and CMP is as follows: all values are WNL except for WBC at 3.7k, RBC at 3.22, HGB at 9.7, HCT at 31.1, Glucose at 105, Creatinine at 1.09, Calcium at 8.2, Albumin at 2.7, AST at 9, Alk Phos at 148, GFR at 58. 07/21/18 TSH is pending 07/21/18 Free T4 is pending  On review of systems, pt reports left sided ear pain, bilateral breast discomfort, stable ankle swelling, eating fair, and denies fevers, headaches, rib pain, abdominal pains, changes in breathing, blood in the stools, concerns for bleeding, abdominal pains, and any other symptoms.   MEDICAL HISTORY:  Past Medical History:  Diagnosis Date   Anxiety    [place]   FNA of thyroid positive for onconytic/hurthle cell carcinoma   Chronic back pain    Complication of anesthesia    Nausea and vomiting   DDD (degenerative disc disease), cervical    Depression    DJD (degenerative joint disease)    Family history of adverse reaction to anesthesia    MOTHER HAS NAUSEA    GERD (gastroesophageal reflux disease)    History of blood transfusion    08/2017   History of rectal fissure    HIT (heparin-induced thrombocytopenia) (HCC)    Hypertension    05/27/2018- not current- runs low  Hypothyroidism    Obesity    Paraplegia at T4 level [facility])    Pneumonia 03/2018   Pulmonary embolus, right ([place]) 2015   Rotator cuff tendonitis right    SURGICAL HISTORY: Past Surgical History:  Procedure Laterality Date   APPLICATION OF CRANIAL NAVIGATION N/A 06/01/2018   Procedure: APPLICATION OF CRANIAL NAVIGATION;  Surgeon: Erline Levine, MD;  Location: [place];  Service: Neurosurgery;  Laterality: N/A;   CHOLECYSTECTOMY N/A 01/25/2018   Procedure: LAPAROSCOPIC CHOLECYSTECTOMY;  Surgeon: Virl Cagey, MD;  Location: [facility];  Service: General;  Laterality: N/A;   CRANIOTOMY N/A 06/01/2018   Procedure: Craniectomy for skull metastasis  with Brainlab;  Surgeon: Erline Levine, MD;  Location: [place];  Service: Neurosurgery;  Laterality: N/A;   FEMUR IM NAIL Left 09/11/2017   Procedure: INTRAMEDULLARY (IM) RETROGRADE FEMORAL NAILING;  Surgeon: Altamese Lequire, MD;  Location: [place];  Service: Orthopedics;  Laterality: Left;   LAMINECTOMY N/A 12/14/2013   Procedure: THORACIC LAMINECTOMY FOR TUMOR THORACIC THREE;  Surgeon: Ashok Pall, MD;  Location: [place] [facility];  Service: Neurosurgery;  Laterality: N/A;  THORACIC LAMINECTOMY FOR TUMOR THORACIC THREE   LAMINECTOMY N/A 07/05/2015   Procedure: THORACIC LAMINECTOMY FOR TUMOR;  Surgeon: Ashok Pall, MD;  Location: [place] [facility];  Service: Neurosurgery;  Laterality: N/A;   LAMINECTOMY N/A 09/03/2017   Procedure: POSTERIOR SPINAL TUMOR RESECTION THORACIC THREE;  Surgeon: Ashok Pall, MD;  Location: [place];  Service: Neurosurgery;  Laterality: N/A;  POSTERIOR SPINAL TUMOR RESECTION THORACIC THREE   POSTERIOR LUMBAR FUSION 4 LEVEL N/A 12/30/2013   Procedure: Thoracic one-Thoracic five posterior thoracic fusion with pedicle screws;  Surgeon: Ashok Pall, MD;  Location: [place] [facility]UR[facility];  Service: Neurosurgery;  Laterality: N/A;  Thoracic one-Thoracic five posterior thoracic fusion with pedicle screws   THYROIDECTOMY N/A 01/09/2014   Procedure: TOTAL THYROIDECTOMY;  Surgeon: Izora Gala, MD;  Location: [facility];  Service: ENT;  Laterality: N/A;   TONSILLECTOMY      SOCIAL HISTORY: Social History   Socioeconomic History   Marital status: Single    Spouse name: Not on file   Number of children: Not on file   Years of education: Not on file   Highest education level: Not on file  Occupational History   Not on file  Social Needs   Financial resource strain: Not on file   Food insecurity:    Worry: Not on file    Inability: Not on file   Transportation needs:    Medical: Not on file    Non-medical: Not on file  Tobacco Use   Smoking status: Never Smoker   Smokeless tobacco:  Never Used  Substance and Sexual Activity   Alcohol use: No   Drug use: No   Sexual activity: Not Currently  Lifestyle   Physical activity:    Days per week: Not on file    Minutes per session: Not on file   Stress: Not on file  Relationships   Social connections:    Talks on phone: Not on file    Gets together: Not on file    Attends religious service: Not on file    Active member of club or organization: Not on file    Attends meetings of clubs or organizations: Not on file    Relationship status: Not on file   Intimate partner violence:    Fear of current or ex partner: No    Emotionally abused: No    Physically abused: No  Forced sexual activity: No  Other Topics Concern   Not on file  Social History Narrative   Not on file    FAMILY HISTORY: Family History  Problem Relation Age of Onset   Hypertension Mother    Diabetes Mother    Hypertension Father    Stroke Father    Diabetes Father    Diabetes Sister    Diabetes Sister     ALLERGIES:  is allergic to bee venom; heparin; keflex [cephalexin]; tramadol; gabapentin; hydrochlorothiazide; hydrocodone; oxycodone-acetaminophen; sorbitol; and tizanidine.  MEDICATIONS:  Current Outpatient Medications  Medication Sig Dispense Refill   acetaminophen (TYLENOL) 325 MG tablet Take 2 tablets (650 mg total) by mouth every 4 (four) hours as needed for mild pain ((score 1 to 3) or temp > 100.5).     albuterol (PROVENTIL) (2.5 MG/3ML) 0.083% nebulizer solution Take 3 mLs (2.5 mg total) by nebulization every 6 (six) hours as needed for wheezing or shortness of breath. 75 mL 12   baclofen (LIORESAL) 20 MG tablet Take 1 tablet (20 mg total) by mouth 2 (two) times daily. 60 tablet 4   cetirizine (ZYRTEC) 10 MG tablet Take 1 tablet (10 mg total) by mouth daily. 30 tablet 11   fluticasone (FLONASE) 50 MCG/ACT nasal spray Place 2 sprays into both nostrils daily. 16 g 6   furosemide (LASIX) 40 MG tablet Take 1  tablet (40 mg total) by mouth every other day. (Patient not taking: Reported on 05/26/2018) 30 tablet    HYDROcodone-acetaminophen (NORCO/VICODIN) 5-325 MG tablet Take 1 tablet by mouth every 4 (four) hours as needed for moderate pain. 20 tablet 0   ipratropium-albuterol (DUONEB) 0.5-2.5 (3) MG/3ML SOLN Take 3 mLs by nebulization 3 (three) times daily. 360 mL    levothyroxine (SYNTHROID, LEVOTHROID) 150 MCG tablet Take 150 mcg by mouth daily before breakfast.      LORazepam (ATIVAN) 1 MG tablet Take 1 tablet (1 mg total) by mouth as needed for anxiety (30 minutes prior to MRI and may repeat once if needed). (Patient not taking: Reported on 05/26/2018) 10 tablet 0   Multiple Vitamin (MULTIVITAMIN WITH MINERALS) TABS tablet Take 1 tablet by mouth daily.     nystatin (MYCOSTATIN/NYSTOP) powder Apply topically 4 (four) times daily. To groin. 60 g 3   ondansetron (ZOFRAN) 4 MG tablet Take 1 tablet by mouth every 6 (six) hours as needed for nausea or vomiting.      oxyCODONE (ROXICODONE) 5 MG immediate release tablet Take 1 tablet (5 mg total) by mouth every 6 (six) hours as needed for severe pain. 12 tablet 0   oxymetazoline (AFRIN NASAL SPRAY) 0.05 % nasal spray Place 1 spray into both nostrils 2 (two) times daily. 30 mL 0   pantoprazole (PROTONIX) 40 MG tablet Take 1 tablet (40 mg total) by mouth daily. 30 tablet 0   polyethylene glycol (MIRALAX / GLYCOLAX) packet Take 17 g by mouth daily. 14 each 0   rivaroxaban (XARELTO) 20 MG TABS tablet Take 1 tablet (20 mg total) by mouth daily with supper. 30 tablet 3   senna (SENOKOT) 8.6 MG TABS tablet Take 1 tablet (8.6 mg total) by mouth at bedtime as needed for mild constipation. 120 each 0   sertraline (ZOLOFT) 50 MG tablet Take 50 mg by mouth daily.      No current facility-administered medications for this visit.     REVIEW OF SYSTEMS:    A 10+ POINT REVIEW OF SYSTEMS WAS OBTAINED including neurology, dermatology, psychiatry, cardiac,  respiratory, lymph, extremities, GI, GU, Musculoskeletal, constitutional, breasts, reproductive, HEENT.  All pertinent positives are noted in the HPI.  All others are negative.   PHYSICAL EXAMINATION:  ECOG PERFORMANCE STATUS: 3 - Symptomatic, >50% confined to bed  Vitals:   07/21/18 1022  BP: (!) 86/62  Pulse: 77  Resp: 18  Temp: 98.2 F (36.8 C)  SpO2: 100%   There were no vitals filed for this visit. .Body mass index is 29.98 kg/m.   GENERAL:alert, in no acute distress and comfortable SKIN: no acute rashes, no significant lesions EYES: conjunctiva are pink and non-injected, sclera anicteric OROPHARYNX: MMM, no exudates, no oropharyngeal erythema or ulceration NECK: supple, no JVD LYMPH:  no palpable lymphadenopathy in the cervical, axillary or inguinal regions LUNGS: clear to auscultation b/l with normal respiratory effort HEART: regular rate & rhythm ABDOMEN:  normoactive bowel sounds , non tender, not distended. No palpable hepatosplenomegaly.  Extremity: 2+ pedal edema PSYCH: alert & oriented x 3 with fluent speech NEURO: no focal motor/sensory deficits   LABORATORY DATA:  I have reviewed the data as listed  . CBC Latest Ref Rng & Units 07/21/2018 05/27/2018 05/21/2018  WBC 4.0 - 10.5 K/uL 3.7(L) 2.6(L) 3.7(L)  Hemoglobin 12.0 - 15.0 g/dL 9.7(L) 8.4(L) 8.5(L)  Hematocrit 36.0 - 46.0 % 31.1(L) 27.6(L) 27.8(L)  Platelets 150 - 400 K/uL 248 108(L) 141(L)   ANC 2.3k . CMP Latest Ref Rng & Units 07/21/2018 05/27/2018 05/21/2018  Glucose 70 - 99 mg/dL 105(H) 101(H) 91  BUN 6 - 20 mg/dL 14 22(H) 23(H)  Creatinine 0.44 - 1.00 mg/dL 1.09(H) 1.55(H) 1.39(H)  Sodium 135 - 145 mmol/L 142 140 141  Potassium 3.5 - 5.1 mmol/L 3.9 3.9 4.0  Chloride 98 - 111 mmol/L 106 103 108  CO2 22 - 32 mmol/L _0 Calcium 8.9 - 10.3 mg/dL 8.2(L) 8.3(L) 7.9(L)  Total Protein 6.5 - 8.1 g/dL 6.5 6.2(L) -  Total Bilirubin 0.3 - 1.2 mg/dL 0.5 0.7 -  Alkaline Phos 38 - 126 U/L 148(H) 159(H) -    AST 15 - 41 U/L 9(L) 19 -  ALT 0 - 44 U/L <6 10 -       06/01/18 Craniotomy pathology:    RADIOGRAPHIC STUDIES: I have personally reviewed the radiological images as listed and agreed with the findings in the report.  .No results found.   ASSESSMENT & PLAN:   53 y.o. 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  Patient has had treatment as noted above. h/o cord compression at T3 level due to recurrent tumor status post partial resection and debulking.   MRI on 08/14/2015 showed improved but persistent cord compression at the T3 level. She has been noted not to be a candidate for further repeat surgery at this time or repeat radiation. Recent PET/CT scan is noted of bowel done at [facility] on 08/07/2015 showed evidence of tumor progression and patient was started on lenvatinib as per Dr. Leamon Arnt. Her thyroglobulin level was 11.7 with anti-body of 9.8 on 08/07/2015 at [place]  PET/CT 11/01/2015: shows relatively stable though high burden of disease. Some concern of local recurrence in the thyroidectomy bed which is asymptomatic at this time. MRI thoracic spine 12/25/2015: No new abnormality since the prior examination. Status post T1-5 fusion for T3 decompression. Mass effect on the cord at the T3 level appears improved compared to the prior exam. Postoperative fluid collection likely  representing a seroma seen on the prior study has slightly decreased in size.  PET/CT 06/03/2016 with Generally reduced hypermetabolic activity in the previously identified lesions, including the pulmonary nodules, dominant upper thoracic metastatic focus, and left lower cervical adenopathy. Mildly reduced size of previous lesions as well. No new lesions.  PET/CT 12/12/2016: Concern for progression of metastatic Hurthle cell thyroid carcinoma with significant increase in metabolic activity of several lymph  nodes, pulmonary nodules, and skeletal metastasis. Most convincing element of progression is a significant increase in metabolic activity (more than doubling). Mild increase in size of the lymph nodes.  PET/CT  2/37/62: Partial metabolic response. Left supraclavicular and right paratracheal nodal metastases, bilateral pulmonary metastases, and osseous metastases involving the upper thoracic spine and right sternum, with mild decrease in metabolism, as described above.   10/26/17 CT Thoracic Spine revealed Severe spinal stenosis at T3 from recurrent epidural tumor with complete effacement of the thecal sac and partial myelographic block. 2. Epidural tumor extends superiorly to T1 and inferiorly to T5 with milder spinal stenosis. 3. New nodular soft tissue dorsally in the canal at L1 and L2 which results in mild spinal stenosis and presumably reflects tumor as well, though it is unclear if this is completely extradural or partially intradural as well. 4. Unchanged appearance of T1-T5 posterior fusion, severe T3 pathologic fracture, and T2-T4 osseous tumor compared to the recent noncontrast CT.   Pt completed 50Gy in 5 fractions from 11/05/17 to 11/24/17 with Dr. [name]   02/15/18 CT C/A/P which revealed No evidence of active bleeding in the chest, abdomen, or pelvis. No retroperitoneal bleed. 2. Moderate bilateral pleural effusions. Multifocal ground-glass opacities throughout both lungs may be pulmonary edema or pneumonia. Mild septal thickening suggests an element of pulmonary edema. 3. Biapical consolidations with bronchiectasis may represent postradiation change, recommend correlation with treatment history. 4. 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 5. Recent cholecystectomy with small amount of residual complex fluid in the operative bed. This may represent postoperative seroma, there is no peripheral enhancement to suggest  superimposed infection. Biloma is not entirely excluded, however lack of significant increase compared to CT 10 days ago argues against this, and there is no other perihepatic fluid. 6. Small amount of free fluid in the pelvis, slightly diminished from prior exam. 7. Confluent edema in the body wall/flanks, similar to prior.   02/13/18 CT Head revealed No acute intracranial process. 2. 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 Recommend MRI of the brain with and without contrast. 3. Borderline parenchymal brain volume loss for age.  05/13/18 PET/CT revealed Multifocal bilateral hypermetabolic pulmonary nodules are again noted. These appear increased in size when compared with the previous exam. Additionally, there is a new hypermetabolic nodule in the right middle lobe. 2. Hypermetabolic right paratracheal lymph node continues exhibits intense FDG uptake. This is not significantly changed in size but exhibits mild decrease in degree of FDG uptake. 3. Hypermetabolic left supraclavicular lymph nodes are again noted. These continue to exhibit similar degrees of intense uptake. The index left supraclavicular lymph node is mildly increased in size in the interval. 4. Increase in size and degree of FDG uptake associated with hypermetabolic right sternal lesion. 5. Persistent intense FDG uptake associated with T3 lesion. The degree of FDG uptake appears mildly improved when compared with previous exam. 6. New bilateral pleural effusions. There is also new bilateral lower extremity edema.  05/18/18 MRI Brain which revealed Progressive  size of left parietal skull lesion concerning for additional site of metastatic Hurthle cell carcinoma. The lesion measures 2.2 x 2.3 x 1.7 cm. Underlying dural enhancement is likely reactive. A dural-based lesion such as meningioma with intraosseous extension is considered less likely. 2. Second site of focal dural thickening over the left occipital lobe.  This is nonspecific. Small meningioma or cortical vessel could have this appearance. Additional sign metastasis is not excluded. 3. Bilateral white matter changes are mildly advanced for age. 4. Mild mucosal thickening in the maxillary sinuses bilaterally appears chronic.   PLAN:  -Discussed pt labwork today, 07/21/18; WBC improved to 3.7k, HGB improved to 9.7. PLT normalized to 248k. -07/21/18 TSH and T4 are pending -Discussed the 06/17/18 MRI Brain which revealed "Interval left parietal craniotomy for tumor removal. Dural thickening at the surgical site is unchanged from the prior study. Inferior to this level is an area of dural or leptomeningeal enhancement which is unchanged from the prior study and will need to be followed closely by imaging. Negative for metastatic disease to the brain parenchyma." -Biopsy from craniotomy consistent with known Hurthle cell carcinoma -Prioritize nutrition, hydration and being infection free prior to restarting Linvatinib.  -Will repeat PET/CT in 10 weeks for interval study, and will consider returning to Linvatinib after this -Will refill Diflucan for vaginal yeast infection -Recommend following up with PCP for adjusting Lasix, and tracking BP with decreased spinal tone and Baclofen use: BP today at 86/62. Optimizing nutritional status -Recommend wearing OTC graded sports compression socks -Continue Xarelto indefinitely -Will see the pt back in 12 weeks   2) s/p Grade 1 oral mucositis related to Lenvatinib -resolved  3) FDG avidity in the rectum - much improved. No significant concerns on last PET  4) abnormal LFTs likely from gallstones - last HIDA - Patent cystic duct without evidence for acute cholecystitis. 2. Normal gallbladder ejection fraction.  5) history of right-sided pulmonary embolism on chronic anticoagulation with Rivaroxaban. Monitor for bleeding. -continue Xarelto   Labs and PET/CT in 10 weeks RTC with Dr Irene Limbo in 12 weeks   All of the  patients questions were answered with apparent satisfaction. The patient knows to call the clinic with any problems, questions or concerns.   The total time spent in the appt was 25 minutes and more than 50% was on counseling and direct patient cares.    Sullivan Lone MD [place] AAHIVMS [facility], [facility] Hematology/Oncology Physician [facility]  (Office):       [phone] (Work cell):  [phone] (Fax):           [phone]  I, Baldwin [place], am acting as a scribe for Dr. Sullivan Lone.   .I have reviewed the above documentation for accuracy and completeness, and I agree with the above. Brunetta Genera MD

## 2018-07-21 ENCOUNTER — Inpatient Hospital Stay: Payer: Medicare Other | Attending: Hematology | Admitting: Hematology

## 2018-07-21 ENCOUNTER — Ambulatory Visit: Payer: Medicare Other

## 2018-07-21 ENCOUNTER — Telehealth: Payer: Self-pay | Admitting: Hematology

## 2018-07-21 ENCOUNTER — Inpatient Hospital Stay: Payer: Medicare Other

## 2018-07-21 ENCOUNTER — Other Ambulatory Visit: Payer: Self-pay

## 2018-07-21 VITALS — BP 86/62 | HR 77 | Temp 98.2°F | Resp 18 | Ht 71.0 in

## 2018-07-21 DIAGNOSIS — M25472 Effusion, left ankle: Secondary | ICD-10-CM | POA: Diagnosis not present

## 2018-07-21 DIAGNOSIS — C778 Secondary and unspecified malignant neoplasm of lymph nodes of multiple regions: Secondary | ICD-10-CM | POA: Insufficient documentation

## 2018-07-21 DIAGNOSIS — N644 Mastodynia: Secondary | ICD-10-CM | POA: Diagnosis not present

## 2018-07-21 DIAGNOSIS — C73 Malignant neoplasm of thyroid gland: Secondary | ICD-10-CM | POA: Insufficient documentation

## 2018-07-21 DIAGNOSIS — Z86711 Personal history of pulmonary embolism: Secondary | ICD-10-CM | POA: Diagnosis not present

## 2018-07-21 DIAGNOSIS — M25471 Effusion, right ankle: Secondary | ICD-10-CM | POA: Insufficient documentation

## 2018-07-21 DIAGNOSIS — H9202 Otalgia, left ear: Secondary | ICD-10-CM | POA: Diagnosis not present

## 2018-07-21 DIAGNOSIS — C7802 Secondary malignant neoplasm of left lung: Secondary | ICD-10-CM | POA: Diagnosis not present

## 2018-07-21 DIAGNOSIS — C7801 Secondary malignant neoplasm of right lung: Secondary | ICD-10-CM | POA: Insufficient documentation

## 2018-07-21 DIAGNOSIS — C7951 Secondary malignant neoplasm of bone: Secondary | ICD-10-CM

## 2018-07-21 DIAGNOSIS — C7931 Secondary malignant neoplasm of brain: Secondary | ICD-10-CM | POA: Diagnosis not present

## 2018-07-21 LAB — CBC WITH DIFFERENTIAL/PLATELET
Abs Immature Granulocytes: 0.05 10*3/uL (ref 0.00–0.07)
Basophils Absolute: 0 10*3/uL (ref 0.0–0.1)
Basophils Relative: 0 %
Eosinophils Absolute: 0.2 10*3/uL (ref 0.0–0.5)
Eosinophils Relative: 5 %
HCT: 31.1 % — ABNORMAL LOW (ref 36.0–46.0)
Hemoglobin: 9.7 g/dL — ABNORMAL LOW (ref 12.0–15.0)
Immature Granulocytes: 1 %
Lymphocytes Relative: 20 %
Lymphs Abs: 0.7 10*3/uL (ref 0.7–4.0)
MCH: 30.1 pg (ref 26.0–34.0)
MCHC: 31.2 g/dL (ref 30.0–36.0)
MCV: 96.6 fL (ref 80.0–100.0)
Monocytes Absolute: 0.2 10*3/uL (ref 0.1–1.0)
Monocytes Relative: 6 %
Neutro Abs: 2.5 10*3/uL (ref 1.7–7.7)
Neutrophils Relative %: 68 %
Platelets: 248 10*3/uL (ref 150–400)
RBC: 3.22 MIL/uL — ABNORMAL LOW (ref 3.87–5.11)
RDW: 12.9 % (ref 11.5–15.5)
WBC: 3.7 10*3/uL — ABNORMAL LOW (ref 4.0–10.5)
nRBC: 0 % (ref 0.0–0.2)

## 2018-07-21 LAB — CMP (CANCER CENTER ONLY)
ALT: <6 U/L (ref 0–44)
AST: 9 U/L — ABNORMAL LOW (ref 15–41)
Albumin: 2.7 g/dL — ABNORMAL LOW (ref 3.5–5.0)
Alkaline Phosphatase: 148 U/L — ABNORMAL HIGH (ref 38–126)
Anion gap: 8 (ref 5–15)
BUN: 14 mg/dL (ref 6–20)
CO2: 28 mmol/L (ref 22–32)
Calcium: 8.2 mg/dL — ABNORMAL LOW (ref 8.9–10.3)
Chloride: 106 mmol/L (ref 98–111)
Creatinine: 1.09 mg/dL — ABNORMAL HIGH (ref 0.44–1.00)
GFR, Est AFR Am: >60 mL/min (ref >60)
GFR, Estimated: 58 mL/min — ABNORMAL LOW (ref >60)
Glucose, Bld: 105 mg/dL — ABNORMAL HIGH (ref 70–99)
Potassium: 3.9 mmol/L (ref 3.5–5.1)
Sodium: 142 mmol/L (ref 135–145)
Total Bilirubin: 0.5 mg/dL (ref 0.3–1.2)
Total Protein: 6.5 g/dL (ref 6.5–8.1)

## 2018-07-21 LAB — T4, FREE: Free T4: 0.9 ng/dL (ref 0.82–1.77)

## 2018-07-21 LAB — TSH: TSH: 8.214 u[IU]/mL — ABNORMAL HIGH (ref 0.308–3.960)

## 2018-07-21 NOTE — Telephone Encounter (Signed)
Scheduled appt per 4/29 los.  Central radiology will contact patient about scan appt.

## 2018-07-22 ENCOUNTER — Ambulatory Visit (INDEPENDENT_AMBULATORY_CARE_PROVIDER_SITE_OTHER): Payer: Medicare Other | Admitting: Family Medicine

## 2018-07-22 ENCOUNTER — Other Ambulatory Visit: Payer: Self-pay

## 2018-07-22 DIAGNOSIS — B372 Candidiasis of skin and nail: Secondary | ICD-10-CM

## 2018-07-22 MED ORDER — NYSTATIN 100000 UNIT/GM EX POWD: Freq: Four times a day (QID) | CUTANEOUS | 3 refills | Status: DC

## 2018-07-22 NOTE — Patient Instructions (Signed)
Use nystatin powder and keep area as dry as possible.    You are due for mammogram in November.

## 2018-07-22 NOTE — Progress Notes (Signed)
    Subjective:  Sarah [name] is a 53 y.o. female who presents to the [facility] today with a chief complaint of yeast rash.   HPI:  Has had candidal intertrigo in her groin area for a while.  It has recently been worse in the setting of antibiotics.  She has been using nystatin powder but has difficulty changing her diaper and so the area stays wet constantly.  It has been more itchy and painful.  She has no urinary or vaginal symptoms. She recently had an ear infection would like her ears rechecked today.  ROS: Per HPI  Objective:  Physical Exam: BP 120/70   Pulse 74   LMP 10/22/2016 (LMP Unknown)   SpO2 99%   Gen: NAD, resting comfortably in wheelchair HEENT: TMs pearly bilaterally with good light reflex. Skin: erythematous macerated skin in the groin    Assessment/Plan:  1. Candidal intertrigo Likely worsened in the setting of recent antibiotic use.  Advised continue nystatin powder use as well as importance of keeping area dry.  Her prominent skin folds preclude this so advised time outside of her diaper. - nystatin (MYCOSTATIN/NYSTOP) powder; Apply topically 4 (four) times daily. To groin.  Dispense: 60 g; Refill: [place], DO PGY-3, [facility] 07/22/2018 4:01 PM

## 2018-07-23 ENCOUNTER — Telehealth: Payer: Self-pay | Admitting: *Deleted

## 2018-07-23 NOTE — Telephone Encounter (Signed)
Called - states [name] said he would send prescription for Diflucan to her pharmacy when she was here 4/29 as the thrush in her mouth is very annoying. Advised her that his note states he will refill Difucan (150 mg tablet x 1 dose) as previously ordered and that that can be sent to pharmacy. Patient states [name] told her he would order something stronger this time. Informed her he did not state/write what it was. Patient states she can wait until Monday when provider returns, she has some mouthwash to use.

## 2018-07-24 ENCOUNTER — Other Ambulatory Visit: Payer: Self-pay | Admitting: Hematology

## 2018-07-24 MED ORDER — FLUCONAZOLE 100 MG PO TABS: 100.0000 mg | ORAL_TABLET | Freq: Every day | ORAL | 0 refills | Status: DC

## 2018-07-29 ENCOUNTER — Other Ambulatory Visit: Payer: Self-pay | Admitting: Family Medicine

## 2018-07-29 ENCOUNTER — Ambulatory Visit (INDEPENDENT_AMBULATORY_CARE_PROVIDER_SITE_OTHER): Payer: Medicare Other | Admitting: Family Medicine

## 2018-07-29 ENCOUNTER — Encounter: Payer: Self-pay | Admitting: Family Medicine

## 2018-07-29 ENCOUNTER — Other Ambulatory Visit: Payer: Self-pay

## 2018-07-29 VITALS — BP 106/78 | HR 78

## 2018-07-29 DIAGNOSIS — N644 Mastodynia: Secondary | ICD-10-CM

## 2018-07-29 DIAGNOSIS — E890 Postprocedural hypothyroidism: Secondary | ICD-10-CM

## 2018-07-29 MED ORDER — LEVOTHYROXINE SODIUM 150 MCG PO TABS: 150.0000 ug | ORAL_TABLET | Freq: Every day | ORAL | 1 refills | Status: AC

## 2018-07-29 NOTE — Progress Notes (Signed)
   [facility] Phone: [phone]   cc: breast pain  Subjective:  Breast pain: Patient states she has had bilateral breast pain over the past month.  The pain is in the left side as well as between the breasts "in the chest wall", as well as the right side in the axillary region.  She says the pain is so bad she cannot even cook.  She only gets relief when she sleeps.  The pain resumes when she wakes up.  She has been taking Tylenol thousand milligrams every 4 times a day.  This does not help her.  She has no discharge, breast rash, or inflammation of the breast.  The patient has had 3 mammograms in the past, the most recent being in 2017.  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  She has had a weight loss of roughly 130 pounds over the past 5 years.  She sees [name] and oncologist for her cancer treatment.  She recently saw him in April and has another appointment in July for a PET scan.  In the past year she has had tumor resection, radiation, femur fracture repair, cholecystectomy, craniectomy. Patient has been out of her synthroid for 4 days.    No nausea vomiting diarrhea, headache, dizziness, shortness of breath, cough  ROS: See HPI for pertinent positives and negatives  Past Medical History  Family history reviewed for today's visit. No changes.  Social history- patient is a non smoker  Objective: BP 106/78   Pulse 78   LMP 10/22/2016 (LMP Unknown)   SpO2 98%  Gen: NAD, alert and oriented, cooperative with exam.  In a wheelchair. CV: normal rate, regular rhythm. No murmurs, no rubs.  Resp: LCTAB, no wheezes, crackles. normal work of breathing Breast: breasts appear symmetrical. No erythema, no dimpling.  No discharge.  No palpable masses. No axillary nodes palpated.  Skin: No rashes, no lesions Psych: Appropriate behavior  Assessment/Plan: Breast pain 1 month of worsening bilateral breast  pain. No skin changes, no discharge.  Most recent mammogram in 2017.  Pt has a history of thyroid cancer that metastasized to her spine. On exam no masses palpated, no abnormal skin findings. Patient to get PET scan in July with oncologist.  - b/l mammogram referral w/ ultrasound  Postoperative hypothyroidism Patient states she needs refill on her synthroid.  prescriber listed as 'historical provider' in epic. She is uncertain of who prescribes it to her.  Refilled her current prescription for 2 more months so she has enough until she can follow up with her prescriber.  Most recent TSH values  in December and April 29th were 8-9.  Prior to that it was > 20.  - refilled synthroid at 150 mcg daily for two months.  [name] to follow up with the provider managing her hypothyroidism (likely [name], who has been ordering her TSH levels).     Clemetine Marker, MD PGY-1

## 2018-07-29 NOTE — Patient Instructions (Addendum)
We have scheduled you an appointment for a mammogram to make sure your breast pain is not due to cancer.  I will follow up with you regarding the results after I receive them.    Wednesday May 20th at 11:20am for your mammogram appointment.    I would like you to schedule a follow up appointment with Dr. Andy Gauss sometime in the the next 1-2 months.    Have a great day, it was a pleasure to meet you.    Clemetine Marker, MD

## 2018-07-30 ENCOUNTER — Telehealth: Payer: Self-pay | Admitting: *Deleted

## 2018-07-30 NOTE — Telephone Encounter (Signed)
Pt wants to know what to do about the breast pain since her appt is still "12 days away".  She wonders if she can have "smoething for pain".  Christen Bame, CMA

## 2018-07-31 NOTE — Assessment & Plan Note (Signed)
1 month of worsening bilateral breast pain. No skin changes, no discharge.  Most recent mammogram in 2017.  Pt has a history of thyroid cancer that metastasized to her spine. On exam no masses palpated, no abnormal skin findings. Patient to get PET scan in July with oncologist.  - b/l mammogram referral w/ ultrasound

## 2018-07-31 NOTE — Assessment & Plan Note (Signed)
Patient states she needs refill on her synthroid.  prescriber listed as 'historical provider' in epic. She is uncertain of who prescribes it to her.  Refilled her current prescription for 2 more months so she has enough until she can follow up with her prescriber.  Most recent TSH values  in December and April 29th were 8-9.  Prior to that it was > 20.  - refilled synthroid at 150 mcg daily for two months.  [name] to follow up with the provider managing her hypothyroidism (likely [name], who has been ordering her TSH levels).

## 2018-07-31 NOTE — Telephone Encounter (Signed)
Pt already on vicodin and oxycodone for breakthrough pain.  I dont think she should take additional tylenol if she's already taking vicodin and nothing I could prescribe to her would be any stronger than what she's already on. I don't believe a topical would help her specific complaint either.  She can try placing heating pads on the area to see if that helps.

## 2018-08-02 NOTE — Telephone Encounter (Signed)
Attempted to call.  No answer and VM was full.  Will wait callback. Christen Bame, CMA

## 2018-08-03 ENCOUNTER — Other Ambulatory Visit: Payer: Self-pay

## 2018-08-03 ENCOUNTER — Telehealth (INDEPENDENT_AMBULATORY_CARE_PROVIDER_SITE_OTHER): Payer: Medicare Other | Admitting: Family Medicine

## 2018-08-03 DIAGNOSIS — R079 Chest pain, unspecified: Secondary | ICD-10-CM | POA: Diagnosis not present

## 2018-08-03 MED ORDER — HYDROCODONE-ACETAMINOPHEN 5-325 MG PO TABS: 1.0000 | ORAL_TABLET | Freq: Three times a day (TID) | ORAL | 0 refills | Status: DC | PRN

## 2018-08-03 NOTE — Progress Notes (Signed)
   Telemedicine visit HPI 53 year old female who calls for chest pain.  Patient was last seen in clinic on 08/02/2018 for the same breast pain.  Description from that note is the chest pain is located left midsternal, right-sided axillary region and right upper quadrant of right breast.  She states that these locations have not changed and the pain is feeling about the same.  She has a mammogram and bilateral breast ultrasound scheduled for 5/21, she is seeking a stopgap to help her until she is able to make at that appointment.  She has follow-up scheduled with Dr. Andy Gauss on 5/22.  States that this breast pain has been worsening for the last few months.  Of note patient does have history of thyroid cancer that metastasized to spine.  Patient with heart score of at least 2 given medical history and age.  No troponin or recent EKG to look at.  Given chronicity of chest pain ACS or PE was not felt to be a significant concern at last clinic visit.  Well score 0 at that visit based on my review.  CC: Chest pain  ROS:   Review of Systems See HPI for ROS.   CC, SH/smoking status, and VS noted  Objective: LMP 10/22/2016 (LMP Unknown)  Gen: No acute distress, comfortable on phone Respiratory: No wheezing appreciated on phone.  Comfortable breathing, no distress Psych: Pleasant, appropriate   Assessment and plan:  Chest pain Chest pain unchanged since last visit.  Multifocal.  Very unlikely to be ACS or PE given chronicity and location of pain.  Could consider EKG or troponin at in person visit.  Discussed alarm symptoms with patient and explained when to follow-up.  Patient has mammogram and bilateral breast ultrasound scheduled for 08/12/2018.  Could potentially uncover etiology.  Based on her description her pain sounds more pleuritic in nature.  NSAIDs contraindicated due to Xarelto use.  Tylenol not working.  Will give 10-day course of Norco 5 mg / 325 mg.  In the interval will get imaging done,  patient has PCP follow-up scheduled on 08/13/2018. -Follow-up on 08/13/2018 -Norco 10-day course -Mammogram and bilateral breast ultrasound ordered -Discussed alarm symptoms and when to present to ED in clinic to patient.   No orders of the defined types were placed in this encounter.   Meds ordered this encounter  Medications  . HYDROcodone-acetaminophen (NORCO/VICODIN) 5-325 MG tablet    Sig: Take 1 tablet by mouth every 8 (eight) hours as needed for up to 10 days for moderate pain.    Dispense:  30 tablet    Refill:  0     Guadalupe [name] MD PGY-2 Family Medicine Resident  08/03/2018 4:13 PM

## 2018-08-03 NOTE — Assessment & Plan Note (Signed)
Chest pain unchanged since last visit.  Multifocal.  Very unlikely to be ACS or PE given chronicity and location of pain.  Could consider EKG or troponin at in person visit.  Discussed alarm symptoms with patient and explained when to follow-up.  Patient has mammogram and bilateral breast ultrasound scheduled for 08/12/2018.  Could potentially uncover etiology.  Based on her description her pain sounds more pleuritic in nature.  NSAIDs contraindicated due to Xarelto use.  Tylenol not working.  Will give 10-day course of Norco 5 mg / 325 mg.  In the interval will get imaging done, patient has PCP follow-up scheduled on 08/13/2018. -Follow-up on 08/13/2018 -Norco 10-day course -Mammogram and bilateral breast ultrasound ordered -Discussed alarm symptoms and when to present to ED in clinic to patient.

## 2018-08-04 DIAGNOSIS — C7931 Secondary malignant neoplasm of brain: Secondary | ICD-10-CM | POA: Diagnosis not present

## 2018-08-07 ENCOUNTER — Other Ambulatory Visit: Payer: Self-pay | Admitting: Physical Medicine & Rehabilitation

## 2018-08-09 ENCOUNTER — Inpatient Hospital Stay (HOSPITAL_COMMUNITY)
Admission: EM | Admit: 2018-08-09 | Discharge: 2018-08-17 | DRG: 571 | Disposition: A | Discharge status: Rehabilitation Facility | Payer: Medicare Other | Attending: Family Medicine | Admitting: Family Medicine

## 2018-08-09 ENCOUNTER — Emergency Department (HOSPITAL_COMMUNITY): Payer: Medicare Other

## 2018-08-09 ENCOUNTER — Other Ambulatory Visit: Payer: Self-pay

## 2018-08-09 ENCOUNTER — Encounter (HOSPITAL_COMMUNITY): Payer: Self-pay

## 2018-08-09 DIAGNOSIS — Z86718 Personal history of other venous thrombosis and embolism: Secondary | ICD-10-CM

## 2018-08-09 DIAGNOSIS — N390 Urinary tract infection, site not specified: Secondary | ICD-10-CM | POA: Diagnosis not present

## 2018-08-09 DIAGNOSIS — C73 Malignant neoplasm of thyroid gland: Secondary | ICD-10-CM | POA: Diagnosis present

## 2018-08-09 DIAGNOSIS — B961 Klebsiella pneumoniae [K. pneumoniae] as the cause of diseases classified elsewhere: Secondary | ICD-10-CM | POA: Diagnosis present

## 2018-08-09 DIAGNOSIS — B9689 Other specified bacterial agents as the cause of diseases classified elsewhere: Secondary | ICD-10-CM | POA: Diagnosis present

## 2018-08-09 DIAGNOSIS — Z1159 Encounter for screening for other viral diseases: Secondary | ICD-10-CM

## 2018-08-09 DIAGNOSIS — Z7901 Long term (current) use of anticoagulants: Secondary | ICD-10-CM

## 2018-08-09 DIAGNOSIS — Z981 Arthrodesis status: Secondary | ICD-10-CM

## 2018-08-09 DIAGNOSIS — J302 Other seasonal allergic rhinitis: Secondary | ICD-10-CM | POA: Diagnosis not present

## 2018-08-09 DIAGNOSIS — G8220 Paraplegia, unspecified: Secondary | ICD-10-CM | POA: Diagnosis present

## 2018-08-09 DIAGNOSIS — B372 Candidiasis of skin and nail: Secondary | ICD-10-CM | POA: Diagnosis not present

## 2018-08-09 DIAGNOSIS — L98429 Non-pressure chronic ulcer of back with unspecified severity: Secondary | ICD-10-CM

## 2018-08-09 DIAGNOSIS — Z79891 Long term (current) use of opiate analgesic: Secondary | ICD-10-CM

## 2018-08-09 DIAGNOSIS — C7931 Secondary malignant neoplasm of brain: Secondary | ICD-10-CM | POA: Diagnosis not present

## 2018-08-09 DIAGNOSIS — Z9049 Acquired absence of other specified parts of digestive tract: Secondary | ICD-10-CM | POA: Diagnosis not present

## 2018-08-09 DIAGNOSIS — R000 Tachycardia, unspecified: Secondary | ICD-10-CM | POA: Diagnosis not present

## 2018-08-09 DIAGNOSIS — K219 Gastro-esophageal reflux disease without esophagitis: Secondary | ICD-10-CM | POA: Diagnosis not present

## 2018-08-09 DIAGNOSIS — L89324 Pressure ulcer of left buttock, stage 4: Principal | ICD-10-CM | POA: Diagnosis present

## 2018-08-09 DIAGNOSIS — L89314 Pressure ulcer of right buttock, stage 4: Secondary | ICD-10-CM | POA: Diagnosis not present

## 2018-08-09 DIAGNOSIS — R509 Fever, unspecified: Secondary | ICD-10-CM | POA: Diagnosis not present

## 2018-08-09 DIAGNOSIS — L89159 Pressure ulcer of sacral region, unspecified stage: Secondary | ICD-10-CM | POA: Diagnosis not present

## 2018-08-09 DIAGNOSIS — Z91030 Bee allergy status: Secondary | ICD-10-CM

## 2018-08-09 DIAGNOSIS — F419 Anxiety disorder, unspecified: Secondary | ICD-10-CM | POA: Diagnosis present

## 2018-08-09 DIAGNOSIS — F329 Major depressive disorder, single episode, unspecified: Secondary | ICD-10-CM | POA: Diagnosis present

## 2018-08-09 DIAGNOSIS — D631 Anemia in chronic kidney disease: Secondary | ICD-10-CM | POA: Diagnosis present

## 2018-08-09 DIAGNOSIS — K5900 Constipation, unspecified: Secondary | ICD-10-CM | POA: Diagnosis not present

## 2018-08-09 DIAGNOSIS — E890 Postprocedural hypothyroidism: Secondary | ICD-10-CM | POA: Diagnosis not present

## 2018-08-09 DIAGNOSIS — L089 Local infection of the skin and subcutaneous tissue, unspecified: Secondary | ICD-10-CM | POA: Diagnosis not present

## 2018-08-09 DIAGNOSIS — Z79899 Other long term (current) drug therapy: Secondary | ICD-10-CM

## 2018-08-09 DIAGNOSIS — L89014 Pressure ulcer of right elbow, stage 4: Secondary | ICD-10-CM | POA: Diagnosis not present

## 2018-08-09 DIAGNOSIS — N319 Neuromuscular dysfunction of bladder, unspecified: Secondary | ICD-10-CM | POA: Diagnosis present

## 2018-08-09 DIAGNOSIS — Z86711 Personal history of pulmonary embolism: Secondary | ICD-10-CM

## 2018-08-09 DIAGNOSIS — M62838 Other muscle spasm: Secondary | ICD-10-CM | POA: Diagnosis present

## 2018-08-09 DIAGNOSIS — Z885 Allergy status to narcotic agent status: Secondary | ICD-10-CM

## 2018-08-09 DIAGNOSIS — Z79890 Hormone replacement therapy (postmenopausal): Secondary | ICD-10-CM

## 2018-08-09 DIAGNOSIS — M549 Dorsalgia, unspecified: Secondary | ICD-10-CM | POA: Diagnosis present

## 2018-08-09 DIAGNOSIS — N183 Chronic kidney disease, stage 3 (moderate): Secondary | ICD-10-CM | POA: Diagnosis present

## 2018-08-09 DIAGNOSIS — Z8249 Family history of ischemic heart disease and other diseases of the circulatory system: Secondary | ICD-10-CM

## 2018-08-09 DIAGNOSIS — Z1611 Resistance to penicillins: Secondary | ICD-10-CM | POA: Diagnosis not present

## 2018-08-09 DIAGNOSIS — L89024 Pressure ulcer of left elbow, stage 4: Secondary | ICD-10-CM | POA: Diagnosis present

## 2018-08-09 DIAGNOSIS — E669 Obesity, unspecified: Secondary | ICD-10-CM | POA: Diagnosis present

## 2018-08-09 DIAGNOSIS — Z6832 Body mass index (BMI) 32.0-32.9, adult: Secondary | ICD-10-CM

## 2018-08-09 DIAGNOSIS — G8929 Other chronic pain: Secondary | ICD-10-CM | POA: Diagnosis present

## 2018-08-09 DIAGNOSIS — Z823 Family history of stroke: Secondary | ICD-10-CM

## 2018-08-09 DIAGNOSIS — Z833 Family history of diabetes mellitus: Secondary | ICD-10-CM

## 2018-08-09 DIAGNOSIS — Z993 Dependence on wheelchair: Secondary | ICD-10-CM

## 2018-08-09 DIAGNOSIS — Z888 Allergy status to other drugs, medicaments and biological substances status: Secondary | ICD-10-CM

## 2018-08-09 DIAGNOSIS — I129 Hypertensive chronic kidney disease with stage 1 through stage 4 chronic kidney disease, or unspecified chronic kidney disease: Secondary | ICD-10-CM | POA: Diagnosis present

## 2018-08-09 DIAGNOSIS — C7951 Secondary malignant neoplasm of bone: Secondary | ICD-10-CM | POA: Diagnosis present

## 2018-08-09 DIAGNOSIS — L98423 Non-pressure chronic ulcer of back with necrosis of muscle: Secondary | ICD-10-CM

## 2018-08-09 DIAGNOSIS — E1122 Type 2 diabetes mellitus with diabetic chronic kidney disease: Secondary | ICD-10-CM | POA: Diagnosis present

## 2018-08-09 DIAGNOSIS — B373 Candidiasis of vulva and vagina: Secondary | ICD-10-CM | POA: Diagnosis present

## 2018-08-09 DIAGNOSIS — K5909 Other constipation: Secondary | ICD-10-CM | POA: Diagnosis present

## 2018-08-09 DIAGNOSIS — R159 Full incontinence of feces: Secondary | ICD-10-CM | POA: Diagnosis present

## 2018-08-09 DIAGNOSIS — N644 Mastodynia: Secondary | ICD-10-CM | POA: Diagnosis present

## 2018-08-09 LAB — COMPREHENSIVE METABOLIC PANEL
ALT: 24 U/L (ref 0–44)
AST: 29 U/L (ref 15–41)
Albumin: 2.6 g/dL — ABNORMAL LOW (ref 3.5–5.0)
Alkaline Phosphatase: 387 U/L — ABNORMAL HIGH (ref 38–126)
Anion gap: 12 (ref 5–15)
BUN: 17 mg/dL (ref 6–20)
CO2: 25 mmol/L (ref 22–32)
Calcium: 8.3 mg/dL — ABNORMAL LOW (ref 8.9–10.3)
Chloride: 98 mmol/L (ref 98–111)
Creatinine, Ser: 1.15 mg/dL — ABNORMAL HIGH (ref 0.44–1.00)
GFR calc Af Amer: >60 mL/min (ref >60)
GFR calc non Af Amer: 55 mL/min — ABNORMAL LOW (ref >60)
Glucose, Bld: 145 mg/dL — ABNORMAL HIGH (ref 70–99)
Potassium: 4.1 mmol/L (ref 3.5–5.1)
Sodium: 135 mmol/L (ref 135–145)
Total Bilirubin: 1 mg/dL (ref 0.3–1.2)
Total Protein: 6.8 g/dL (ref 6.5–8.1)

## 2018-08-09 LAB — CBC WITH DIFFERENTIAL/PLATELET
Abs Immature Granulocytes: 0.06 10*3/uL (ref 0.00–0.07)
Basophils Absolute: 0 10*3/uL (ref 0.0–0.1)
Basophils Relative: 0 %
Eosinophils Absolute: 0.1 10*3/uL (ref 0.0–0.5)
Eosinophils Relative: 1 %
HCT: 28.9 % — ABNORMAL LOW (ref 36.0–46.0)
Hemoglobin: 9.1 g/dL — ABNORMAL LOW (ref 12.0–15.0)
Immature Granulocytes: 1 %
Lymphocytes Relative: 2 %
Lymphs Abs: 0.2 10*3/uL — ABNORMAL LOW (ref 0.7–4.0)
MCH: 29.4 pg (ref 26.0–34.0)
MCHC: 31.5 g/dL (ref 30.0–36.0)
MCV: 93.2 fL (ref 80.0–100.0)
Monocytes Absolute: 0 10*3/uL — ABNORMAL LOW (ref 0.1–1.0)
Monocytes Relative: 1 %
Neutro Abs: 6 10*3/uL (ref 1.7–7.7)
Neutrophils Relative %: 95 %
Platelets: 266 10*3/uL (ref 150–400)
RBC: 3.1 MIL/uL — ABNORMAL LOW (ref 3.87–5.11)
RDW: 13.4 % (ref 11.5–15.5)
WBC: 6.3 10*3/uL (ref 4.0–10.5)
nRBC: 0 % (ref 0.0–0.2)

## 2018-08-09 LAB — PROTIME-INR
INR: 2.1 — ABNORMAL HIGH (ref 0.8–1.2)
Prothrombin Time: 23.3 seconds — ABNORMAL HIGH (ref 11.4–15.2)

## 2018-08-09 LAB — LACTIC ACID, PLASMA: Lactic Acid, Venous: 1.9 mmol/L (ref 0.5–1.9)

## 2018-08-09 MED ORDER — ACETAMINOPHEN 325 MG PO TABS
650.0000 mg | ORAL_TABLET | Freq: Once | ORAL | Status: AC | PRN
  Administered 2018-08-09: 21:00:00 650 mg via ORAL
  Filled 2018-08-09: qty 2

## 2018-08-09 NOTE — ED Notes (Signed)
Pt complaining of swollen labia area and "possible yeast infection" since starting her antibiotics. Complaining of chronic back pain since she has not been able to see her PT due to [place] closings. Also complaining of bilateral breast pain. Pt alert and oriented

## 2018-08-09 NOTE — ED Triage Notes (Signed)
Patient has multiple complaints: "vaginal itching, fibroids in breast, constipated, heard something pop in right leg, I just don't feel good at all"

## 2018-08-09 NOTE — ED Notes (Signed)
Unable to collect UA. Pt is incontinent.

## 2018-08-10 ENCOUNTER — Observation Stay (HOSPITAL_COMMUNITY): Payer: Medicare Other

## 2018-08-10 ENCOUNTER — Encounter (HOSPITAL_COMMUNITY): Payer: Self-pay | Admitting: General Surgery

## 2018-08-10 DIAGNOSIS — G8221 Paraplegia, complete: Secondary | ICD-10-CM | POA: Diagnosis not present

## 2018-08-10 DIAGNOSIS — F329 Major depressive disorder, single episode, unspecified: Secondary | ICD-10-CM | POA: Diagnosis present

## 2018-08-10 DIAGNOSIS — R0981 Nasal congestion: Secondary | ICD-10-CM | POA: Diagnosis not present

## 2018-08-10 DIAGNOSIS — Z9049 Acquired absence of other specified parts of digestive tract: Secondary | ICD-10-CM | POA: Diagnosis not present

## 2018-08-10 DIAGNOSIS — Z86711 Personal history of pulmonary embolism: Secondary | ICD-10-CM | POA: Diagnosis not present

## 2018-08-10 DIAGNOSIS — E46 Unspecified protein-calorie malnutrition: Secondary | ICD-10-CM | POA: Diagnosis not present

## 2018-08-10 DIAGNOSIS — G959 Disease of spinal cord, unspecified: Secondary | ICD-10-CM | POA: Diagnosis not present

## 2018-08-10 DIAGNOSIS — Z1611 Resistance to penicillins: Secondary | ICD-10-CM | POA: Diagnosis present

## 2018-08-10 DIAGNOSIS — K592 Neurogenic bowel, not elsewhere classified: Secondary | ICD-10-CM | POA: Diagnosis not present

## 2018-08-10 DIAGNOSIS — L98423 Non-pressure chronic ulcer of back with necrosis of muscle: Secondary | ICD-10-CM | POA: Diagnosis not present

## 2018-08-10 DIAGNOSIS — E039 Hypothyroidism, unspecified: Secondary | ICD-10-CM | POA: Diagnosis not present

## 2018-08-10 DIAGNOSIS — L89314 Pressure ulcer of right buttock, stage 4: Secondary | ICD-10-CM | POA: Diagnosis not present

## 2018-08-10 DIAGNOSIS — N390 Urinary tract infection, site not specified: Secondary | ICD-10-CM | POA: Diagnosis not present

## 2018-08-10 DIAGNOSIS — Z86718 Personal history of other venous thrombosis and embolism: Secondary | ICD-10-CM | POA: Diagnosis not present

## 2018-08-10 DIAGNOSIS — L089 Local infection of the skin and subcutaneous tissue, unspecified: Secondary | ICD-10-CM | POA: Diagnosis not present

## 2018-08-10 DIAGNOSIS — C7989 Secondary malignant neoplasm of other specified sites: Secondary | ICD-10-CM | POA: Diagnosis not present

## 2018-08-10 DIAGNOSIS — B9689 Other specified bacterial agents as the cause of diseases classified elsewhere: Secondary | ICD-10-CM | POA: Diagnosis present

## 2018-08-10 DIAGNOSIS — B372 Candidiasis of skin and nail: Secondary | ICD-10-CM | POA: Diagnosis present

## 2018-08-10 DIAGNOSIS — B961 Klebsiella pneumoniae [K. pneumoniae] as the cause of diseases classified elsewhere: Secondary | ICD-10-CM | POA: Diagnosis not present

## 2018-08-10 DIAGNOSIS — L89309 Pressure ulcer of unspecified buttock, unspecified stage: Secondary | ICD-10-CM | POA: Diagnosis not present

## 2018-08-10 DIAGNOSIS — R7881 Bacteremia: Secondary | ICD-10-CM | POA: Diagnosis not present

## 2018-08-10 DIAGNOSIS — Z7901 Long term (current) use of anticoagulants: Secondary | ICD-10-CM | POA: Diagnosis not present

## 2018-08-10 DIAGNOSIS — I959 Hypotension, unspecified: Secondary | ICD-10-CM | POA: Diagnosis not present

## 2018-08-10 DIAGNOSIS — L89154 Pressure ulcer of sacral region, stage 4: Secondary | ICD-10-CM | POA: Diagnosis not present

## 2018-08-10 DIAGNOSIS — N3000 Acute cystitis without hematuria: Secondary | ICD-10-CM | POA: Diagnosis not present

## 2018-08-10 DIAGNOSIS — G8929 Other chronic pain: Secondary | ICD-10-CM | POA: Diagnosis not present

## 2018-08-10 DIAGNOSIS — N183 Chronic kidney disease, stage 3 (moderate): Secondary | ICD-10-CM | POA: Diagnosis not present

## 2018-08-10 DIAGNOSIS — K5641 Fecal impaction: Secondary | ICD-10-CM | POA: Diagnosis not present

## 2018-08-10 DIAGNOSIS — G4700 Insomnia, unspecified: Secondary | ICD-10-CM | POA: Diagnosis not present

## 2018-08-10 DIAGNOSIS — L89159 Pressure ulcer of sacral region, unspecified stage: Secondary | ICD-10-CM | POA: Diagnosis present

## 2018-08-10 DIAGNOSIS — D62 Acute posthemorrhagic anemia: Secondary | ICD-10-CM | POA: Diagnosis not present

## 2018-08-10 DIAGNOSIS — C7951 Secondary malignant neoplasm of bone: Secondary | ICD-10-CM | POA: Diagnosis not present

## 2018-08-10 DIAGNOSIS — C73 Malignant neoplasm of thyroid gland: Secondary | ICD-10-CM | POA: Diagnosis not present

## 2018-08-10 DIAGNOSIS — L89014 Pressure ulcer of right elbow, stage 4: Secondary | ICD-10-CM | POA: Diagnosis present

## 2018-08-10 DIAGNOSIS — R5381 Other malaise: Secondary | ICD-10-CM | POA: Diagnosis not present

## 2018-08-10 DIAGNOSIS — K5909 Other constipation: Secondary | ICD-10-CM | POA: Diagnosis not present

## 2018-08-10 DIAGNOSIS — R509 Fever, unspecified: Secondary | ICD-10-CM | POA: Diagnosis not present

## 2018-08-10 DIAGNOSIS — D649 Anemia, unspecified: Secondary | ICD-10-CM | POA: Diagnosis not present

## 2018-08-10 DIAGNOSIS — L89024 Pressure ulcer of left elbow, stage 4: Secondary | ICD-10-CM | POA: Diagnosis present

## 2018-08-10 DIAGNOSIS — J302 Other seasonal allergic rhinitis: Secondary | ICD-10-CM | POA: Diagnosis present

## 2018-08-10 DIAGNOSIS — M4714 Other spondylosis with myelopathy, thoracic region: Secondary | ICD-10-CM | POA: Diagnosis not present

## 2018-08-10 DIAGNOSIS — C649 Malignant neoplasm of unspecified kidney, except renal pelvis: Secondary | ICD-10-CM | POA: Diagnosis not present

## 2018-08-10 DIAGNOSIS — G8220 Paraplegia, unspecified: Secondary | ICD-10-CM | POA: Diagnosis not present

## 2018-08-10 DIAGNOSIS — K219 Gastro-esophageal reflux disease without esophagitis: Secondary | ICD-10-CM | POA: Diagnosis not present

## 2018-08-10 DIAGNOSIS — L89303 Pressure ulcer of unspecified buttock, stage 3: Secondary | ICD-10-CM | POA: Diagnosis not present

## 2018-08-10 DIAGNOSIS — Z1159 Encounter for screening for other viral diseases: Secondary | ICD-10-CM | POA: Diagnosis not present

## 2018-08-10 DIAGNOSIS — I951 Orthostatic hypotension: Secondary | ICD-10-CM | POA: Diagnosis not present

## 2018-08-10 DIAGNOSIS — L89153 Pressure ulcer of sacral region, stage 3: Secondary | ICD-10-CM | POA: Diagnosis not present

## 2018-08-10 DIAGNOSIS — E890 Postprocedural hypothyroidism: Secondary | ICD-10-CM | POA: Diagnosis present

## 2018-08-10 DIAGNOSIS — I129 Hypertensive chronic kidney disease with stage 1 through stage 4 chronic kidney disease, or unspecified chronic kidney disease: Secondary | ICD-10-CM | POA: Diagnosis not present

## 2018-08-10 DIAGNOSIS — E441 Mild protein-calorie malnutrition: Secondary | ICD-10-CM | POA: Diagnosis not present

## 2018-08-10 DIAGNOSIS — A499 Bacterial infection, unspecified: Secondary | ICD-10-CM | POA: Diagnosis not present

## 2018-08-10 DIAGNOSIS — Z85850 Personal history of malignant neoplasm of thyroid: Secondary | ICD-10-CM | POA: Diagnosis not present

## 2018-08-10 DIAGNOSIS — C7931 Secondary malignant neoplasm of brain: Secondary | ICD-10-CM | POA: Diagnosis not present

## 2018-08-10 DIAGNOSIS — M549 Dorsalgia, unspecified: Secondary | ICD-10-CM | POA: Diagnosis not present

## 2018-08-10 DIAGNOSIS — I9589 Other hypotension: Secondary | ICD-10-CM | POA: Diagnosis not present

## 2018-08-10 DIAGNOSIS — N319 Neuromuscular dysfunction of bladder, unspecified: Secondary | ICD-10-CM | POA: Diagnosis not present

## 2018-08-10 DIAGNOSIS — K5900 Constipation, unspecified: Secondary | ICD-10-CM | POA: Diagnosis not present

## 2018-08-10 DIAGNOSIS — R159 Full incontinence of feces: Secondary | ICD-10-CM | POA: Diagnosis present

## 2018-08-10 DIAGNOSIS — B373 Candidiasis of vulva and vagina: Secondary | ICD-10-CM | POA: Diagnosis present

## 2018-08-10 DIAGNOSIS — L89324 Pressure ulcer of left buttock, stage 4: Secondary | ICD-10-CM | POA: Diagnosis not present

## 2018-08-10 DIAGNOSIS — R739 Hyperglycemia, unspecified: Secondary | ICD-10-CM | POA: Diagnosis not present

## 2018-08-10 LAB — URINALYSIS, ROUTINE W REFLEX MICROSCOPIC
Bilirubin Urine: NEGATIVE
Glucose, UA: NEGATIVE mg/dL
Ketones, ur: NEGATIVE mg/dL
Nitrite: NEGATIVE
Protein, ur: 30 mg/dL — AB
Specific Gravity, Urine: 1.017 (ref 1.005–1.030)
WBC, UA: >50 WBC/hpf — ABNORMAL HIGH (ref 0–5)
pH: 6 (ref 5.0–8.0)

## 2018-08-10 LAB — BASIC METABOLIC PANEL
Anion gap: 9 (ref 5–15)
BUN: 16 mg/dL (ref 6–20)
CO2: 24 mmol/L (ref 22–32)
Calcium: 7.4 mg/dL — ABNORMAL LOW (ref 8.9–10.3)
Chloride: 100 mmol/L (ref 98–111)
Creatinine, Ser: 1.23 mg/dL — ABNORMAL HIGH (ref 0.44–1.00)
GFR calc Af Amer: 58 mL/min — ABNORMAL LOW (ref >60)
GFR calc non Af Amer: 50 mL/min — ABNORMAL LOW (ref >60)
Glucose, Bld: 114 mg/dL — ABNORMAL HIGH (ref 70–99)
Potassium: 3.2 mmol/L — ABNORMAL LOW (ref 3.5–5.1)
Sodium: 133 mmol/L — ABNORMAL LOW (ref 135–145)

## 2018-08-10 LAB — HEMOGLOBIN AND HEMATOCRIT, BLOOD
HCT: 23.6 % — ABNORMAL LOW (ref 36.0–46.0)
Hemoglobin: 7.5 g/dL — ABNORMAL LOW (ref 12.0–15.0)

## 2018-08-10 LAB — CBC
HCT: 21.9 % — ABNORMAL LOW (ref 36.0–46.0)
Hemoglobin: 7 g/dL — ABNORMAL LOW (ref 12.0–15.0)
MCH: 29.3 pg (ref 26.0–34.0)
MCHC: 32 g/dL (ref 30.0–36.0)
MCV: 91.6 fL (ref 80.0–100.0)
Platelets: 227 10*3/uL (ref 150–400)
RBC: 2.39 MIL/uL — ABNORMAL LOW (ref 3.87–5.11)
RDW: 13.4 % (ref 11.5–15.5)
WBC: 16.8 10*3/uL — ABNORMAL HIGH (ref 4.0–10.5)
nRBC: 0 % (ref 0.0–0.2)

## 2018-08-10 LAB — SARS CORONAVIRUS 2 BY RT PCR (HOSPITAL ORDER, PERFORMED IN ~~LOC~~ HOSPITAL LAB): SARS Coronavirus 2: NEGATIVE

## 2018-08-10 LAB — MRSA PCR SCREENING: MRSA by PCR: NEGATIVE

## 2018-08-10 MED ORDER — VANCOMYCIN HCL 10 G IV SOLR
2000.0000 mg | Freq: Once | INTRAVENOUS | Status: AC
  Administered 2018-08-10: 2000 mg via INTRAVENOUS
  Filled 2018-08-10: qty 2000

## 2018-08-10 MED ORDER — SERTRALINE HCL 50 MG PO TABS
50.0000 mg | ORAL_TABLET | Freq: Every day | ORAL | Status: DC
  Administered 2018-08-10 – 2018-08-17 (×7): 50 mg via ORAL
  Filled 2018-08-10 (×8): qty 1

## 2018-08-10 MED ORDER — PIPERACILLIN-TAZOBACTAM 3.375 G IVPB
3.3750 g | Freq: Three times a day (TID) | INTRAVENOUS | Status: DC
  Administered 2018-08-10 – 2018-08-11 (×6): 3.375 g via INTRAVENOUS
  Filled 2018-08-10 (×9): qty 50

## 2018-08-10 MED ORDER — SODIUM CHLORIDE 0.9% FLUSH: 3.0000 mL | INTRAVENOUS | Status: DC | PRN

## 2018-08-10 MED ORDER — SODIUM CHLORIDE 0.9 % IV SOLN
250.0000 mL | INTRAVENOUS | Status: DC | PRN
  Administered 2018-08-10: 250 mL via INTRAVENOUS

## 2018-08-10 MED ORDER — LEVOTHYROXINE SODIUM 75 MCG PO TABS
150.0000 ug | ORAL_TABLET | Freq: Every day | ORAL | Status: DC
  Administered 2018-08-10 – 2018-08-17 (×8): 150 ug via ORAL
  Filled 2018-08-10 (×8): qty 2

## 2018-08-10 MED ORDER — LORATADINE 10 MG PO TABS
10.0000 mg | ORAL_TABLET | Freq: Every day | ORAL | Status: DC
  Administered 2018-08-10 – 2018-08-17 (×7): 10 mg via ORAL
  Filled 2018-08-10 (×8): qty 1

## 2018-08-10 MED ORDER — SODIUM CHLORIDE 0.9 % IV BOLUS
500.0000 mL | Freq: Once | INTRAVENOUS | Status: AC
  Administered 2018-08-10: 01:00:00 500 mL via INTRAVENOUS

## 2018-08-10 MED ORDER — IOHEXOL 300 MG/ML  SOLN
100.0000 mL | Freq: Once | INTRAMUSCULAR | Status: AC | PRN
  Administered 2018-08-10: 02:00:00 100 mL via INTRAVENOUS

## 2018-08-10 MED ORDER — COLLAGENASE 250 UNIT/GM EX OINT
TOPICAL_OINTMENT | Freq: Every day | CUTANEOUS | Status: DC
  Administered 2018-08-10 – 2018-08-17 (×6): via TOPICAL
  Filled 2018-08-10 (×3): qty 30

## 2018-08-10 MED ORDER — SODIUM CHLORIDE 0.9% FLUSH
3.0000 mL | Freq: Two times a day (BID) | INTRAVENOUS | Status: DC
  Administered 2018-08-10 – 2018-08-17 (×7): 3 mL via INTRAVENOUS

## 2018-08-10 MED ORDER — FLUTICASONE PROPIONATE 50 MCG/ACT NA SUSP
2.0000 | Freq: Every day | NASAL | Status: DC
  Administered 2018-08-10 – 2018-08-16 (×6): 2 via NASAL
  Filled 2018-08-10: qty 16

## 2018-08-10 MED ORDER — ACETAMINOPHEN 325 MG PO TABS: 650.0000 mg | ORAL_TABLET | Freq: Four times a day (QID) | ORAL | Status: DC | PRN

## 2018-08-10 MED ORDER — RIVAROXABAN 20 MG PO TABS
20.0000 mg | ORAL_TABLET | Freq: Every day | ORAL | Status: DC
  Filled 2018-08-10: qty 1

## 2018-08-10 MED ORDER — ACETAMINOPHEN 650 MG RE SUPP: 650.0000 mg | Freq: Four times a day (QID) | RECTAL | Status: DC | PRN

## 2018-08-10 MED ORDER — VANCOMYCIN HCL 10 G IV SOLR
1250.0000 mg | INTRAVENOUS | Status: DC
  Administered 2018-08-11 – 2018-08-15 (×5): 1250 mg via INTRAVENOUS
  Filled 2018-08-10 (×5): qty 1250

## 2018-08-10 MED ORDER — ADULT MULTIVITAMIN W/MINERALS CH
1.0000 | ORAL_TABLET | Freq: Every day | ORAL | Status: DC
  Administered 2018-08-11 – 2018-08-17 (×6): 1 via ORAL
  Filled 2018-08-10 (×10): qty 1

## 2018-08-10 MED ORDER — BACLOFEN 10 MG PO TABS
20.0000 mg | ORAL_TABLET | Freq: Two times a day (BID) | ORAL | Status: DC
  Administered 2018-08-10 – 2018-08-11 (×4): 20 mg via ORAL
  Filled 2018-08-10 (×4): qty 2

## 2018-08-10 MED ORDER — PANTOPRAZOLE SODIUM 40 MG PO TBEC
40.0000 mg | DELAYED_RELEASE_TABLET | Freq: Every day | ORAL | Status: DC
  Administered 2018-08-10 – 2018-08-17 (×7): 40 mg via ORAL
  Filled 2018-08-10 (×8): qty 1

## 2018-08-10 MED ORDER — POTASSIUM CHLORIDE CRYS ER 20 MEQ PO TBCR
40.0000 meq | EXTENDED_RELEASE_TABLET | Freq: Once | ORAL | Status: AC
  Administered 2018-08-10: 40 meq via ORAL
  Filled 2018-08-10: qty 2

## 2018-08-10 MED ORDER — PIPERACILLIN-TAZOBACTAM 3.375 G IVPB 30 MIN
3.3750 g | Freq: Once | INTRAVENOUS | Status: AC
  Administered 2018-08-10: 3.375 g via INTRAVENOUS
  Filled 2018-08-10: qty 50

## 2018-08-10 MED ORDER — OXYCODONE HCL 5 MG PO TABS
5.0000 mg | ORAL_TABLET | Freq: Four times a day (QID) | ORAL | Status: DC | PRN
  Administered 2018-08-10 – 2018-08-17 (×16): 5 mg via ORAL
  Filled 2018-08-10 (×16): qty 1

## 2018-08-10 MED ORDER — VANCOMYCIN HCL IN DEXTROSE 1-5 GM/200ML-% IV SOLN: 1000.0000 mg | Freq: Once | INTRAVENOUS | Status: DC

## 2018-08-10 MED ORDER — PROMETHAZINE HCL 25 MG PO TABS
12.5000 mg | ORAL_TABLET | Freq: Four times a day (QID) | ORAL | Status: DC | PRN
  Administered 2018-08-15 – 2018-08-17 (×5): 12.5 mg via ORAL
  Filled 2018-08-10 (×5): qty 1

## 2018-08-10 NOTE — ED Notes (Signed)
Report given to 5M RN. All questions answered 

## 2018-08-10 NOTE — Progress Notes (Signed)
Rehab Admissions Coordinator Note:  Patient was screened by Michel Santee, PT, DPT for appropriateness for an Inpatient Acute Rehab Consult.  At this time, we are recommending Inpatient Rehab consult.  I will contact MD for order.   Michel Santee 08/10/2018, 4:37 PM  I can be reached at [phone].

## 2018-08-10 NOTE — Procedures (Signed)
1.  Excisional vs non-excisional. Excisional  2.  Tool used for debridement (curette, scapel, etc.) 15 blade scalpel  3.  Frequency of surgical debridement.   Once  4.  Measurement of total devitalized tissue (wound surface) before and after surgical debridement.   Right wound before: 8 x 6 x 0.5 cm Right wound after: 8 x 6 x 3 cm Left wound before: 4 x 4 x 0 cm Left wound after: 4 x 4 x 1 cm  5.  Area and depth of devitalized tissue removed from wound. Right wound 48 cm with a depth of 3 cm Left wound 16 cm with a depth of 1 cm  6.  Blood loss and description of tissue removed.  No EBL Tissue removed from the right wound included necrotic skin, subcutaneous tissue, and some muscle. Tissue removed from left wound included necrotic skin and subcutaneous tissue.  7.  Evidence of the progress of the wound's response to treatment.  A.  Current wound volume (current dimensions and depth).  Right wound: 8 x 6 x 3 cm left wound: 4 x 4 x 1 cm  B.  Presence (and extent of) of infection.  No definitive infection noted just necrotic tissue  C.  Presence (and extent of) of non viable tissue.  Essentially still 100% of both wounds are necrotic tissue.  D.  Other material in the wound that is expected to inhibit healing.  N/A  8.  Was there any viable tissue removed (measurements): No  Henreitta Cea 1:10 PM 08/10/2018

## 2018-08-10 NOTE — ED Notes (Signed)
Wounds cleaned and packed. Clean brief and pad placed under pt

## 2018-08-10 NOTE — Progress Notes (Signed)
Received report from ED RN. Room ready for patient. Jori Thrall Joselita, RN 

## 2018-08-10 NOTE — Consult Note (Signed)
[place] Nurse wound consult pending placement of images in the EMR. Consultation by review of records and assistance from the bedside nurse/clinical staff.    Reason for Consult: Wound type: multiple pressure injuries  Patient from home, no wound care at home per records. [name] paraplegic.  CT results reviewed:Right inferior pubic ramus region gas containing decubitus wound  Wound; Unstageable pressure injury left and right ischium; reviewed images, needs general surgery consultation.  Deep tissue pressure injury left laterl ankle Unclear etiology, wound on the plantar surface of the right foot  Pressure Injury POA: Yes  Measurement: see nursing flowsheet Wound bed: 1. Left ischium: 100% grey, yellow non viable tissue 2. Right ishcium: 95% grey/yellow non viable tissue 3. Left  heel:100% purple, non blanchable 4. Right plantar surface: 100% black  5. Labia: no images Drainage (amount, consistency, odor) : see nursing flow sheet Periwound:  Area on the left ischium has extended deep tissue injury that extends circumferentially dark purple which would indicate tissue depth in a much wider and deeper area of the subcutaneous tissue.   Dressing procedure/placement/frequency:  1. Prevalon boots bilaterally for offloading the heels 2. Maximize nutrition for wound healing 3. Low air loss mattress for pressure redistribution  4. General surgery consult per MD notes for CT findings.   [place] nurse will review images once placed in the chart and discuss assessment and care with bedside nurse  [place], [place], [place]

## 2018-08-10 NOTE — Progress Notes (Signed)
New Admission Note:   Arrival Method: Arrived from [facility] ED via stretcher Mental Orientation: Alert and oriented x4 Telemetry: Box #18 Assessment: Completed Skin: See doc flowsheet IV: Lt FA Pain: Denies Tubes: Foley catheter Safety Measures: Safety Fall Prevention Plan has been discussed.  Admission: Completed 5MW Orientation: Patient has been orientated to the room, unit and staff.  Family: None at bedside  Orders have been reviewed and implemented. Will continue to monitor the patient. Call light has been placed within reach and bed alarm has been activated.   Enrique Weiss [employer], RN-BC Phone number: [phone]

## 2018-08-10 NOTE — Progress Notes (Signed)
PT Cancellation Note  Patient Details Name: YVANNA VIDAS MRN: [id number] DOB: December 23, 1965   Cancelled Treatment:    Reason Eval/Treat Not Completed: Active bedrest order   Notified MD via Amion;   Await bedrest orders to be lifted to proceed with PT eval;   Roney Marion, [place] Pager [phone] Office [place] 08/10/2018, 8:32 AM

## 2018-08-10 NOTE — ED Provider Notes (Signed)
[name] EMERG[facility]Y DEPARTMENT Provider Note   CSN: [id number] Arrival date & time: 08/09/18  2054    History   Chief Complaint Chief Complaint  Patient presents with  . Mutliple Complaints    HPI Sarah [name] is a 53 y.o. female.     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 She presents with complaint of vaginal redness, being treated by her doctor for a presumed vaginal yeast infection secondary to recent antibiotic use. She has not had any improvement. She reports having little to no sensation in the area due to her paraplegia so does not complain of pain. She is incontinent of urine and bowel so cannot state whether or not she has had vaginal discharge. She also complains of breast pain but this in ongoing and unchanged tonight. No cough, congestion, sore throat or known fever. No nausea or vomiting.   The history is provided by the patient. No language interpreter was used.    Past Medical History:  Diagnosis Date  . Anxiety   . 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 Chronic back pain   . Complication of anesthesia    Nausea and vomiting  . DDD (degenerative disc disease), cervical   . Depression   . DJD (degenerative joint disease)   . Family history of adverse reaction to anesthesia    MOTHER HAS NAUSEA   . GERD (gastroesophageal reflux disease)   . History of blood transfusion    08/2017  . History of rectal fissure   . HIT (heparin-induced thrombocytopenia) ([place])   . Hypertension    05/27/2018- not current- runs low  . Hypothyroidism   . Obesity   . Paraplegia at T4 level ([place])   . Pneumonia 03/2018  . Pulmonary embolus, right ([place]) 2015  . Rotator cuff tendonitis right    Patient Active Problem List   Diagnosis Date Noted  . Eustachian tube disorder, left 06/30/2018  . Urinary tract infection  06/30/2018  . Malignant neoplasm ([place]) 06/01/2018  . Metastasis to brain ([place]) 06/01/2018  . Transaminitis 04/09/2018  . Hypoalbuminemia 04/09/2018  . Dyspnea   . Multifocal pneumonia   . Palliative care by specialist   . Goals of care, counseling/discussion   . DNR (do not resuscitate) discussion   . Fecal impaction ([place]) 02/06/2018  . HCAP (healthcare-associated pneumonia) 02/06/2018  . Anemia   . Chronic anticoagulation   . Pressure injury of skin 02/04/2018  . Fracture   . Acute lower UTI   . Myelopathy ([place]) 09/21/2017  . Muscle spasms of both lower extremities   . Closed fracture of shaft of left femur ([place])   . Acute blood loss anemia   . Hypothyroidism   . Nausea & vomiting 09/15/2017  . Closed fracture of left distal femur ([place]) 09/11/2017  . Paraplegia ([place]) 09/08/2017  . Obesity (BMI 30-39.9) 09/03/2017  . Status post surgery 09/03/2017  . Calculus of gallbladder without cholecystitis without obstruction   . Biliary dyskinesia   . Intractable nausea and vomiting 08/09/2017  . Thrombocytopenia ([place]) 08/09/2017  . Elevated bilirubin 08/09/2017  . AKI (acute kidney injury) ([place]) 08/09/2017  . Chest pain 01/26/2017  . Upper respiratory infection, viral 01/26/2017  . Counseling regarding advanced care planning and goals of care 01/02/2017  . Bilateral rotator cuff syndrome 02/12/2016  . E. coli UTI  08/01/2015  . Constipation due to neurogenic bowel 08/01/2015  . 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 Spastic paraparesis   . Neurogenic bladder   . History of pulmonary embolism   . Post-operative pain   . Metastatic cancer ([place])   . Steroid-induced hyperglycemia   . Depression   . Obstipation   . Thoracic spine tumor 07/06/2015  . 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 ([place]) 07/06/2015  . Spinal cord compression due to malignant neoplasm metastatic to spine ([place])   . Postoperative hypothyroidism   . 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 Spasticity 09/19/2014  . Hypertension 05/01/2014  . Ingrown right big toenail 05/01/2014  . GERD (gastroesophageal reflux disease) 02/14/2014  . Dysphagia, pharyngoesophageal phase 02/06/2014  . Type 2 diabetes mellitus without complication ([place]) 68/34/1962  . Constipation   . Neurogenic bowel   . Paraplegia at T4 level ([place]) 01/20/2014  . Metastatic cancer to spine ([place]) 01/19/2014  . Rotator cuff tendonitis   . 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 Leg weakness, bilateral 12/13/2013    Past Surgical History:  Procedure Laterality Date  . APPLICATION OF CRANIAL NAVIGATION N/A 06/01/2018   Procedure: APPLICATION OF CRANIAL NAVIGATION;  Surgeon: Erline Levine, MD;  Location: [place];  Service: Neurosurgery;  Laterality: N/A;  . CHOLECYSTECTOMY N/A 01/25/2018   Procedure: LAPAROSCOPIC CHOLECYSTECTOMY;  Surgeon: Virl Cagey, MD;  Location: [facility];  Service: General;  Laterality: N/A;  . CRANIOTOMY N/A 06/01/2018   Procedure: Craniectomy for skull metastasis with Brainlab;  Surgeon: Erline Levine, MD;  Location: [place];  Service: Neurosurgery;  Laterality: N/A;  . FEMUR IM NAIL Left 09/11/2017   Procedure: INTRAMEDULLARY (IM) RETROGRADE FEMORAL NAILING;  Surgeon: Altamese Macksburg, MD;  Location: [place];  Service: Orthopedics;  Laterality: Left;  . LAMINECTOMY N/A 12/14/2013   Procedure: THORACIC LAMINECTOMY FOR TUMOR THORACIC THREE;  Surgeon: Ashok Pall, MD;  Location: [place] [facility];  Service: Neurosurgery;  Laterality: N/A;  THORACIC LAMINECTOMY FOR TUMOR THORACIC THREE  . LAMINECTOMY N/A 07/05/2015   Procedure: THORACIC LAMINECTOMY FOR TUMOR;  Surgeon: Ashok Pall, MD;  Location: [place] [facility];  Service: Neurosurgery;  Laterality: N/A;  . LAMINECTOMY N/A 09/03/2017   Procedure: POSTERIOR SPINAL TUMOR RESECTION THORACIC THREE;  Surgeon: Ashok Pall, MD;  Location: [place];  Service: Neurosurgery;  Laterality: N/A;   POSTERIOR SPINAL TUMOR RESECTION THORACIC THREE  . POSTERIOR LUMBAR FUSION 4 LEVEL N/A 12/30/2013   Procedure: Thoracic one-Thoracic five posterior thoracic fusion with pedicle screws;  Surgeon: Ashok Pall, MD;  Location: [place] [facility];  Service: Neurosurgery;  Laterality: N/A;  Thoracic one-Thoracic five posterior thoracic fusion with pedicle screws  . THYROIDECTOMY N/A 01/09/2014   Procedure: TOTAL THYROIDECTOMY;  Surgeon: Izora Gala, MD;  Location: [facility];  Service: ENT;  Laterality: N/A;  . TONSILLECTOMY       OB History    Gravida      Para      Term      Preterm      AB      Living  0     SAB      TAB      Ectopic      Multiple      Live Births               Home Medications    Prior to Admission medications   Medication Sig Start Date End Date  Taking? Authorizing Provider  acetaminophen (TYLENOL) 325 MG tablet Take 2 tablets (650 mg total) by mouth every 4 (four) hours as needed for mild pain ((score 1 to 3) or temp > 100.5). 09/11/17   Angiulli, Lavon Paganini, PA-C  albuterol (PROVENTIL) (2.5 MG/3ML) 0.083% nebulizer solution Take 3 mLs (2.5 mg total) by nebulization every 6 (six) hours as needed for wheezing or shortness of breath. 02/23/18   Johnson, Clanford L, MD  baclofen (LIORESAL) 20 MG tablet Take 1 tablet (20 mg total) by mouth 2 (two) times daily. 06/14/18   [name], MD  cetirizine (ZYRTEC) 10 MG tablet Take 1 tablet (10 mg total) by mouth daily. 06/18/18   Bonnita Hollow, MD  fluconazole (DIFLUCAN) 100 MG tablet Take 1 tablet (100 mg total) by mouth daily. 07/24/18   Brunetta Genera, MD  fluticasone (FLONASE) 50 MCG/ACT nasal spray Place 2 sprays into both nostrils daily. 06/30/18   Zenia Resides, MD  furosemide (LASIX) 40 MG tablet Take 1 tablet (40 mg total) by mouth every other day. Patient not taking: Reported on 05/26/2018 02/25/18   Murlean Iba, MD  HYDROcodone-acetaminophen (NORCO/VICODIN) 5-325 MG tablet Take 1 tablet by mouth  every 8 (eight) hours as needed for up to 10 days for moderate pain. 08/03/18 08/13/18  Guadalupe [name], MD  ipratropium-albuterol (DUONEB) 0.5-2.5 (3) MG/3ML SOLN Take 3 mLs by nebulization 3 (three) times daily. 02/23/18   Murlean Iba, MD  levothyroxine (SYNTHROID) 150 MCG tablet Take 1 tablet (150 mcg total) by mouth daily before breakfast. 07/29/18 09/27/18  [name], MD  LORazepam (ATIVAN) 1 MG tablet Take 1 tablet (1 mg total) by mouth as needed for anxiety (30 minutes prior to MRI and may repeat once if needed). Patient not taking: Reported on 05/26/2018 05/18/18   Bruning, Ailene Ards, PA-C  Multiple Vitamin (MULTIVITAMIN WITH MINERALS) TABS tablet Take 1 tablet by mouth daily. 02/24/18   Johnson, Clanford L, MD  nystatin (MYCOSTATIN/NYSTOP) powder Apply topically 4 (four) times daily. To groin. 07/22/18   Bufford Lope, DO  ondansetron (ZOFRAN) 4 MG tablet Take 1 tablet by mouth every 6 (six) hours as needed for nausea or vomiting.  07/25/14   [provider]  oxyCODONE (ROXICODONE) 5 MG immediate release tablet Take 1 tablet (5 mg total) by mouth every 6 (six) hours as needed for severe pain. 02/23/18 02/23/19  Johnson, Clanford L, MD  oxymetazoline (AFRIN NASAL SPRAY) 0.05 % nasal spray Place 1 spray into both nostrils 2 (two) times daily. 06/18/18   Bonnita Hollow, MD  pantoprazole (PROTONIX) 40 MG tablet Take 1 tablet (40 mg total) by mouth daily. 08/09/18   [name], MD  polyethylene glycol [facility] Floria Raveling) packet Take 17 g by mouth daily. 10/30/17   Angiulli, Lavon Paganini, PA-C  rivaroxaban (XARELTO) 20 MG TABS tablet Take 1 tablet (20 mg total) by mouth daily with supper. 07/19/18   Diallo, Earna Coder, MD  senna (SENOKOT) 8.6 MG TABS tablet Take 1 tablet (8.6 mg total) by mouth at bedtime as needed for mild constipation. 02/23/18   Johnson, Clanford L, MD  sertraline (ZOLOFT) 50 MG tablet Take 50 mg by mouth daily.  12/24/17   [provider]    Family History Family  History  Problem Relation Age of Onset  . Hypertension Mother   . Diabetes Mother   . Hypertension Father   . Stroke Father   . Diabetes Father   . Diabetes Sister   .  Diabetes Sister     Social History Social History   Tobacco Use  . Smoking status: Never Smoker  . Smokeless tobacco: Never Used  Substance Use Topics  . Alcohol use: No  . Drug use: No     Allergies   Bee venom; Heparin; Keflex [cephalexin]; Tramadol; Gabapentin; Hydrochlorothiazide; Hydrocodone; Oxycodone-acetaminophen; Sorbitol; and Tizanidine   Review of Systems Review of Systems  Constitutional: Negative for chills and fever.  HENT: Negative.   Respiratory: Negative.   Cardiovascular: Negative.   Gastrointestinal: Negative.   Genitourinary:       See HPI.  Musculoskeletal: Positive for back pain (has chronic back pain).  Skin: Negative.   Neurological: Negative.      Physical Exam Updated Vital Signs BP (!) 139/91 (BP Location: Right Arm)   Pulse (!) 109   Temp (!) 102.5 F (39.2 C) (Oral)   Resp 20   Ht 5\' 10"  (1.778 m)   Wt 102.1 kg   LMP 10/22/2016 (LMP Unknown)   SpO2 98%   BMI 32.28 kg/m   Physical Exam Vitals signs and nursing note reviewed.  Constitutional:      General: She is not in acute distress.    Appearance: She is obese. She is not ill-appearing.  HENT:     Head: Normocephalic.     Mouth/Throat:     Mouth: Mucous membranes are moist.  Neck:     Musculoskeletal: Normal range of motion and neck supple.  Cardiovascular:     Rate and Rhythm: Regular rhythm. Tachycardia present.  Pulmonary:     Effort: Pulmonary effort is normal.     Breath sounds: No wheezing or rhonchi.  Abdominal:     General: There is no distension.     Palpations: There is no mass.     Tenderness: There is no abdominal tenderness.  Genitourinary:    Comments: There are 2 large decubitus sores to bilateral lower buttocks that are full thickness and malodorous. There is bleeding around the  borders. Necrotic central areas.   Vaginal labia are erythematous and minimally swollen.  Musculoskeletal:     Comments: LE paraplegia with spasm.   Skin:    General: Skin is warm and dry.  Neurological:     General: No focal deficit present.     Mental Status: She is oriented to person, place, and time.      ED Treatments / Results  Labs (all labs ordered are listed, but only abnormal results are displayed) Labs Reviewed  COMPREHENSIVE METABOLIC PANEL - Abnormal; Notable for the following components:      Result Value   Glucose, Bld 145 (*)    Creatinine, Ser 1.15 (*)    Calcium 8.3 (*)    Albumin 2.6 (*)    Alkaline Phosphatase 387 (*)    GFR calc non Af Amer 55 (*)    All other components within normal limits  CBC WITH DIFFERENTIAL/PLATELET - Abnormal; Notable for the following components:   RBC 3.10 (*)    Hemoglobin 9.1 (*)    HCT 28.9 (*)    Lymphs Abs 0.2 (*)    Monocytes Absolute 0.0 (*)    All other components within normal limits  PROTIME-INR - Abnormal; Notable for the following components:   Prothrombin Time 23.3 (*)    INR 2.1 (*)    All other components within normal limits  CULTURE, BLOOD (ROUTINE X 2)  CULTURE, BLOOD (ROUTINE X 2)  LACTIC ACID, PLASMA  URINALYSIS, ROUTINE W REFLEX MICROSCOPIC  Results for orders placed or performed during the hospital encounter of 08/09/18  Comprehensive metabolic panel  Result Value Ref Range   Sodium 135 135 - 145 mmol/L   Potassium 4.1 3.5 - 5.1 mmol/L   Chloride 98 98 - 111 mmol/L   CO2 25 22 - 32 mmol/L   Glucose, Bld 145 (H) 70 - 99 mg/dL   BUN 17 6 - 20 mg/dL   Creatinine, Ser 1.15 (H) 0.44 - 1.00 mg/dL   Calcium 8.3 (L) 8.9 - 10.3 mg/dL   Total Protein 6.8 6.5 - 8.1 g/dL   Albumin 2.6 (L) 3.5 - 5.0 g/dL   AST 29 15 - 41 U/L   ALT 24 0 - 44 U/L   Alkaline Phosphatase 387 (H) 38 - 126 U/L   Total Bilirubin 1.0 0.3 - 1.2 mg/dL   GFR calc non Af Amer 55 (L) >60 mL/min   GFR calc Af Amer >60 >60 mL/min    Anion gap 12 5 - 15  Lactic acid, plasma  Result Value Ref Range   Lactic Acid, Venous 1.9 0.5 - 1.9 mmol/L  CBC with Differential  Result Value Ref Range   WBC 6.3 4.0 - 10.5 K/uL   RBC 3.10 (L) 3.87 - 5.11 MIL/uL   Hemoglobin 9.1 (L) 12.0 - 15.0 g/dL   HCT 28.9 (L) 36.0 - 46.0 %   MCV 93.2 80.0 - 100.0 fL   MCH 29.4 26.0 - 34.0 pg   MCHC 31.5 30.0 - 36.0 g/dL   RDW 13.4 11.5 - 15.5 %   Platelets 266 150 - 400 K/uL   nRBC 0.0 0.0 - 0.2 %   Neutrophils Relative % 95 %   Neutro Abs 6.0 1.7 - 7.7 K/uL   Lymphocytes Relative 2 %   Lymphs Abs 0.2 (L) 0.7 - 4.0 K/uL   Monocytes Relative 1 %   Monocytes Absolute 0.0 (L) 0.1 - 1.0 K/uL   Eosinophils Relative 1 %   Eosinophils Absolute 0.1 0.0 - 0.5 K/uL   Basophils Relative 0 %   Basophils Absolute 0.0 0.0 - 0.1 K/uL   Smear Review MORPHOLOGY UNREMARKABLE    Immature Granulocytes 1 %   Abs Immature Granulocytes 0.06 0.00 - 0.07 K/uL  Protime-INR  Result Value Ref Range   Prothrombin Time 23.3 (H) 11.4 - 15.2 seconds   INR 2.1 (H) 0.8 - 1.2   *Note: Due to a large number of results and/or encounters for the requested time period, some results have not been displayed. A complete set of results can be found in Results Review.    EKG None  Radiology Dg Chest 2 View  Result Date: 08/09/2018 CLINICAL DATA:  53 year old female with suspected sepsis. Metastatic Hurthle cell thyroid carcinoma. EXAM: CHEST - 2 VIEW COMPARISON:  PET-CT 05/13/2018 and earlier. FINDINGS: AP and lateral views of the chest. Mildly lower lung volumes compared to 2018. Stable cardiac size and mediastinal contours. No pneumothorax, pulmonary edema, pleural effusion or confluent pulmonary opacity. Left greater than right pleural effusions have resolved since February. Visualized tracheal air column is within normal limits. Chronic cervicothoracic/upper thoracic spinal hardware. Stable visualized osseous structures. Negative visible bowel gas pattern. IMPRESSION:  No acute cardiopulmonary abnormality. Electronically Signed   By: Genevie Ann M.D.   On: 08/09/2018 22:04    Procedures Procedures (including critical care time)  Medications Ordered in ED Medications  acetaminophen (TYLENOL) tablet 650 mg (650 mg Oral Given 08/09/18 2125)     Initial Impression / Assessment  and Plan / ED Course  I have reviewed the triage vital signs and the nursing notes.  Pertinent labs & imaging results that were available during my care of the patient were reviewed by me and considered in my medical decision making (see chart for details).  Sarah [name] was evaluated in the Emergency Department on 08/20/2018 for the symptoms described in the history of present illness. She was evaluated in the context of the global COVID-19 pandemic, which necessitated consideration that the patient might be at risk for infection with the SARS-CoV-2 virus that causes COVID-19. Institutional protocols and algorithms that pertain to the evaluation of patients at risk for COVID-19 are in a state of rapid change based on information released by regulatory bodies including the CDC and federal and state organizations. These policies and algorithms were followed during the patient's care in the ED.        Patient with history of CA with bony mets (not on chemo), PE on Xarleto, HTN presents with complaint of vaginal swelling, presumed vaginal yeast, not improved with treatment.   She is febrile on arrival to 102.5. She was unaware she had any fever. She is found to have large, malodorous decubitus ulcers to buttocks bilaterally. Feel these are likely the source of fever. UA pending. With fever of 102.5, patient's immunocompromised status, feel admission is appropriate.  Vanc and Zosyn started for comprehensive coverage. Patient has no pain.  Ulcerations were cleaned, packed with wet to dry dressings. CT pending for evaluation of osteo.   No URI symptoms of cough, congestion, sore throat. COVID  considered unlikely.   Discussed admission with [facility] resident Dr. Jeannine Kitten who accepts the patient for admission.    Final Clinical Impressions(s) / ED Diagnoses   Final diagnoses:  None   1. Wound infection, buttocks 2. Febrile illness 3. History of parapletia  ED Discharge Orders    None       Charlann Lange, PA-C 08/10/18 0631    Merryl Hacker, MD 08/11/18 [phone]

## 2018-08-10 NOTE — ED Notes (Signed)
UA with culture sent @ 0145

## 2018-08-10 NOTE — Progress Notes (Signed)
Pharmacy Antibiotic Note  Sarah [name] is a 53 y.o. female admitted on 08/09/2018 and found to have large decubitus ulcers in setting of paraplegia.  Pharmacy has been consulted for vancomycin dosing.  WBC wnl, LA 1.9, T 102.5 SCr 1.15 (BL ~ 1-1.2) CrCl 74 ml/min  Plan: Vancomycin 2000mg  IV x1, then 1250mg  IV every 24h (calc AUC 439, SCr 1.15) Monitor renal function, Cx and clinical progression to narrow Vancomycin levels at steady state  Height: 5\' 10"  (177.8 cm) Weight: 225 lb (102.1 kg) IBW/kg (Calculated) : 68.5  Temp (24hrs), Avg:101 F (38.3 C), Min:99.4 F (37.4 C), Max:102.5 F (39.2 C)  Recent Labs  Lab 08/09/18 2129  WBC 6.3  CREATININE 1.15*  LATICACIDVEN 1.9    Estimated Creatinine Clearance: 74 mL/min (A) (by C-G formula based on SCr of 1.15 mg/dL (H)).    Allergies  Allergen Reactions  . Bee Venom Anaphylaxis  . Heparin Other (See Comments)    Weak positive platelets induced antibodies, SRA negative--2015  . Keflex [Cephalexin] Nausea And Vomiting  . Tramadol Nausea And Vomiting  . Gabapentin Nausea And Vomiting  . Hydrochlorothiazide Nausea And Vomiting  . Hydrocodone Nausea And Vomiting  . Oxycodone-Acetaminophen Nausea And Vomiting  . Sorbitol Nausea Only    And abdominal distress  . Tizanidine     Fatigue     Antimicrobials this admission: Vanc 5/19>> Zosyn x1  Dose adjustments this admission: n/a  Microbiology results: 5/19 BCx: sent 5/19 COVID: sent  Bertis Ruddy, PharmD Clinical Pharmacist Please check AMION for all [place] numbers 08/10/2018 1:40 AM

## 2018-08-10 NOTE — ED Notes (Signed)
ED TO INPATIENT HANDOFF REPORT  ED Nurse Name and Phone #: [phone] Lucita Ferrara Name/Age/Gender Sarah [name] 53 y.o. female Room/Bed: 019C/019C  Code Status   Code Status: Prior  Home/SNF/Other Home Patient oriented to: self, place, time and situation Is this baseline? Yes   Triage Complete: Triage complete  Chief Complaint open wounds on butt  Triage Note Patient has multiple complaints: "vaginal itching, fibroids in breast, constipated, heard something pop in right leg, I just don't feel good at all"   Allergies Allergies  Allergen Reactions  . Bee Venom Anaphylaxis  . Heparin Other (See Comments)    Weak positive platelets induced antibodies, SRA negative--2015  . Keflex [Cephalexin] Nausea And Vomiting  . Tramadol Nausea And Vomiting  . Gabapentin Nausea And Vomiting  . Hydrochlorothiazide Nausea And Vomiting  . Hydrocodone Nausea And Vomiting  . Oxycodone-Acetaminophen Nausea And Vomiting  . Sorbitol Nausea Only    And abdominal distress  . Tizanidine     Fatigue     Level of Care/Admitting Diagnosis ED Disposition    ED Disposition Condition [facility] Area: [place] [[id number]]  Level of Care: Med-Surg [16]  Covid Evaluation: Screening Protocol (No Symptoms)  Diagnosis: Bacterial skin infection [355732]  Admitting Physician: [name] [[id number]]  Attending Physician: Leeanne Rio [[id number]  PT Class (Do Not Modify): Observation [104]  PT Acc Code (Do Not Modify): Observation [10022]       B Medical/Surgery History Past Medical History:  Diagnosis Date  . Anxiety   . 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 Chronic back pain   . Complication of anesthesia    Nausea and vomiting  . DDD (degenerative disc disease), cervical   . Depression   . DJD (degenerative joint disease)   . Family history of adverse reaction to anesthesia    MOTHER HAS NAUSEA   . GERD  (gastroesophageal reflux disease)   . History of blood transfusion    08/2017  . History of rectal fissure   . HIT (heparin-induced thrombocytopenia) ([place])   . Hypertension    05/27/2018- not current- runs low  . Hypothyroidism   . Obesity   . Paraplegia at T4 level ([place])   . Pneumonia 03/2018  . Pulmonary embolus, right ([place]) 2015  . Rotator cuff tendonitis right   Past Surgical History:  Procedure Laterality Date  . APPLICATION OF CRANIAL NAVIGATION N/A 06/01/2018   Procedure: APPLICATION OF CRANIAL NAVIGATION;  Surgeon: Erline Levine, MD;  Location: [place];  Service: Neurosurgery;  Laterality: N/A;  . CHOLECYSTECTOMY N/A 01/25/2018   Procedure: LAPAROSCOPIC CHOLECYSTECTOMY;  Surgeon: Virl Cagey, MD;  Location: [facility];  Service: General;  Laterality: N/A;  . CRANIOTOMY N/A 06/01/2018   Procedure: Craniectomy for skull metastasis with Brainlab;  Surgeon: Erline Levine, MD;  Location: [place];  Service: Neurosurgery;  Laterality: N/A;  . FEMUR IM NAIL Left 09/11/2017   Procedure: INTRAMEDULLARY (IM) RETROGRADE FEMORAL NAILING;  Surgeon: Altamese Tome, MD;  Location: [place];  Service: Orthopedics;  Laterality: Left;  . LAMINECTOMY N/A 12/14/2013   Procedure: THORACIC LAMINECTOMY FOR TUMOR THORACIC THREE;  Surgeon: Ashok Pall, MD;  Location: [place] [facility];  Service: Neurosurgery;  Laterality: N/A;  THORACIC LAMINECTOMY FOR TUMOR THORACIC THREE  . LAMINECTOMY N/A 07/05/2015   Procedure: THORACIC LAMINECTOMY FOR TUMOR;  Surgeon: Ashok Pall, MD;  Location: [place] [facility];  Service: Neurosurgery;  Laterality: N/A;  .  LAMINECTOMY N/A 09/03/2017   Procedure: POSTERIOR SPINAL TUMOR RESECTION THORACIC THREE;  Surgeon: Ashok Pall, MD;  Location: [place];  Service: Neurosurgery;  Laterality: N/A;  POSTERIOR SPINAL TUMOR RESECTION THORACIC THREE  . POSTERIOR LUMBAR FUSION 4 LEVEL N/A 12/30/2013   Procedure: Thoracic one-Thoracic five posterior thoracic fusion with pedicle screws;  Surgeon: Ashok Pall, MD;  Location: [place] [facility];  Service: Neurosurgery;  Laterality: N/A;  Thoracic one-Thoracic five posterior thoracic fusion with pedicle screws  . THYROIDECTOMY N/A 01/09/2014   Procedure: TOTAL THYROIDECTOMY;  Surgeon: Izora Gala, MD;  Location: [place];  Service: ENT;  Laterality: N/A;  . TONSILLECTOMY       A IV Location/Drains/Wounds Patient Lines/Drains/Airways Status   Active Line/Drains/Airways    Name:   Placement date:   Placement time:   Site:   Days:   Peripheral IV 08/10/18 Anterior;Left;Proximal Forearm   08/10/18    0044    Forearm   less than 1   Incision (Closed) 06/01/18 Head   06/01/18    1311     70   Pressure Injury 02/04/18 Stage II -  Partial thickness loss of dermis presenting as a shallow open ulcer with a red, pink wound bed without slough.   02/04/18    0130     187   Pressure Injury 02/04/18 Stage I -  Intact skin with non-blanchable redness of a localized area usually over a bony prominence.   02/04/18    0120     187   Wound / Incision (Open or Dehisced) 09/08/17 Other (Comment) Leg Lateral;Right;Lower cut-like wound   09/08/17    1950    Leg   336          Intake/Output Last 24 hours No intake or output data in the 24 hours ending 08/10/18 0129  Labs/Imaging Results for orders placed or performed during the hospital encounter of 08/09/18 (from the past 48 hour(s))  Comprehensive metabolic panel     Status: Abnormal   Collection Time: 08/09/18  9:29 PM  Result Value Ref Range   Sodium 135 135 - 145 mmol/L   Potassium 4.1 3.5 - 5.1 mmol/L   Chloride 98 98 - 111 mmol/L   CO2 25 22 - 32 mmol/L   Glucose, Bld 145 (H) 70 - 99 mg/dL   BUN 17 6 - 20 mg/dL   Creatinine, Ser 1.15 (H) 0.44 - 1.00 mg/dL   Calcium 8.3 (L) 8.9 - 10.3 mg/dL   Total Protein 6.8 6.5 - 8.1 g/dL   Albumin 2.6 (L) 3.5 - 5.0 g/dL   AST 29 15 - 41 U/L   ALT 24 0 - 44 U/L   Alkaline Phosphatase 387 (H) 38 - 126 U/L   Total Bilirubin 1.0 0.3 - 1.2 mg/dL   GFR calc non Af Amer  55 (L) >60 mL/min   GFR calc Af Amer >60 >60 mL/min   Anion gap 12 5 - 15    Comment: Performed at [facility], [address] [place], [place] [address]  Lactic acid, plasma     Status: None   Collection Time: 08/09/18  9:29 PM  Result Value Ref Range   Lactic Acid, Venous 1.9 0.5 - 1.9 mmol/L    Comment: Performed at [facility] [address], [place], [place] [address]  CBC with Differential     Status: Abnormal   Collection Time: 08/09/18  9:29 PM  Result Value Ref Range   WBC  6.3 4.0 - 10.5 K/uL   RBC 3.10 (L) 3.87 - 5.11 MIL/uL   Hemoglobin 9.1 (L) 12.0 - 15.0 g/dL   HCT 28.9 (L) 36.0 - 46.0 %   MCV 93.2 80.0 - 100.0 fL   MCH 29.4 26.0 - 34.0 pg   MCHC 31.5 30.0 - 36.0 g/dL   RDW 13.4 11.5 - 15.5 %   Platelets 266 150 - 400 K/uL   nRBC 0.0 0.0 - 0.2 %   Neutrophils Relative % 95 %   Neutro Abs 6.0 1.7 - 7.7 K/uL   Lymphocytes Relative 2 %   Lymphs Abs 0.2 (L) 0.7 - 4.0 K/uL   Monocytes Relative 1 %   Monocytes Absolute 0.0 (L) 0.1 - 1.0 K/uL   Eosinophils Relative 1 %   Eosinophils Absolute 0.1 0.0 - 0.5 K/uL   Basophils Relative 0 %   Basophils Absolute 0.0 0.0 - 0.1 K/uL   Smear Review MORPHOLOGY UNREMARKABLE    Immature Granulocytes 1 %   Abs Immature Granulocytes 0.06 0.00 - 0.07 K/uL    Comment: Performed at [facility], [address] [place], [place] [address]  Protime-INR     Status: Abnormal   Collection Time: 08/09/18  9:29 PM  Result Value Ref Range   Prothrombin Time 23.3 (H) 11.4 - 15.2 seconds   INR 2.1 (H) 0.8 - 1.2    Comment: (NOTE) INR goal varies based on device and disease states. Performed at [facility], [place] [address] [place], [place] [address]    *Note: Due to a large number of results and/or encounters for the requested time period, some results have not been displayed. A complete set of results can be found in Results Review.   Dg Chest 2 View  Result Date: 08/09/2018 CLINICAL DATA:  53 year old female  with suspected sepsis. Metastatic Hurthle cell thyroid carcinoma. EXAM: CHEST - 2 VIEW COMPARISON:  PET-CT 05/13/2018 and earlier. FINDINGS: AP and lateral views of the chest. Mildly lower lung volumes compared to 2018. Stable cardiac size and mediastinal contours. No pneumothorax, pulmonary edema, pleural effusion or confluent pulmonary opacity. Left greater than right pleural effusions have resolved since February. Visualized tracheal air column is within normal limits. Chronic cervicothoracic/upper thoracic spinal hardware. Stable visualized osseous structures. Negative visible bowel gas pattern. IMPRESSION: No acute cardiopulmonary abnormality. Electronically Signed   By: Genevie Ann M.D.   On: 08/09/2018 22:04    Pending Labs Unresulted Labs (From admission, onward)    Start     Ordered   08/10/18 0018  SARS Coronavirus 2 (CEPHEID - Performed in [facility] hospital lab), [place]  (Asymptomatic Patients Labs)  Once,   R    Question:  Rule Out  Answer:  Yes   08/10/18 0018   08/09/18 2121  Culture, blood (Routine x 2)  BLOOD CULTURE X 2,   STAT     08/09/18 2121   08/09/18 2121  Urinalysis, Routine w reflex microscopic  ONCE - STAT,   STAT     08/09/18 2121          Vitals/Pain Today's Vitals   08/10/18 0019 08/10/18 0030 08/10/18 0116 08/10/18 0118  BP:    (!) 90/58  Pulse: 94 86  77  Resp: 18 (!) 21  14  Temp:   99.4 F (37.4 C)   TempSrc:   Oral   SpO2: 98% 96%  97%  Weight:      Height:  PainSc:        Isolation Precautions No active isolations  Medications Medications  vancomycin (VANCOCIN) IVPB 1000 mg/200 mL premix (has no administration in time range)  piperacillin-tazobactam (ZOSYN) IVPB 3.375 g (3.375 g Intravenous New Bag/Given 08/10/18 0119)  acetaminophen (TYLENOL) tablet 650 mg (650 mg Oral Given 08/09/18 2125)  sodium chloride 0.9 % bolus 500 mL (500 mLs Intravenous New Bag/Given 08/10/18 0100)    Mobility power wheelchair High fall risk   Focused  Assessments Neuro Assessment Handoff:  Swallow screen pass? N/A         Neuro Assessment:   Neuro Checks:      Last Documented NIHSS Modified Score:   Has TPA been given? No If patient is a Neuro Trauma and patient is going to OR before floor call report to [place] nurse: [phone] or [phone]     R Recommendations: See Admitting Provider Note  Report given to:   Additional Notes: Paraplegic. Wounds on both buttocks that are packed

## 2018-08-10 NOTE — Evaluation (Signed)
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 Pt currently with functional limitations due to the deficits listed below (see PT Problem List). Admitted from home where she gets assist for transfers to her power chair using a sliding board; Presents with weakness, decr balance, and overall functional decline, as well as with stage 4 ischial decubitus ulcers;  Pt will benefit from skilled PT to increase their independence and safety with mobility to allow discharge to the venue listed below.    She has had good outcomes form previous CIR stay -- recommend CIR for post-acute rehab.     Follow Up Recommendations CIR    Equipment Recommendations  Other (comment)(to be determined)    Recommendations for Other Services Rehab consult     Precautions / Restrictions Precautions Precautions: (P) Fall Precaution Comments: (P) Watch for shear with transfers      Mobility  Bed Mobility Overal bed mobility: (P) Needs Assistance Bed Mobility: (P) Rolling;Sidelying to Sit;Sit to Sidelying Rolling: (P) Mod assist;+2 for physical assistance Sidelying to sit: (P) Mod assist;+2 for physical assistance     Sit to sidelying: (P) Mod assist;+2 for physical assistance General bed mobility comments: (P) Heavy mod assist and use of bed pad to cradle hips and stabilize LEs to roll and come to sit  Transfers Overall transfer level: (P) Needs assistance   Transfers: (P) Lateral/Scoot Transfers          Lateral/Scoot Transfers: (P) +2 physical assistance;Mod assist General transfer  comment: (P) Simulated lateral scoot transfer, scooting to HOB, using bed pad to minimize shear  Ambulation/Gait                Stairs            Wheelchair Mobility    Modified Rankin (Stroke Patients Only)       Balance Overall balance assessment: (P) Needs assistance   Sitting balance-Leahy Scale: (P) Poor Sitting balance - Comments: (P) Dependent on UE support sitting EOB; occasional LE spasms can throw her balance off                                     Pertinent Vitals/Pain Pain Assessment: No/denies pain(Decr sensation pelvis and LEs)    Home Living Family/patient expects to be discharged to:: Private residence Living Arrangements: Other relatives Available Help at Discharge: Available PRN/intermittently Type of Home: House Home Access: Ramped entrance     Home Layout: One level Home Equipment: Environmental consultant - 2 wheels;Bedside commode;Shower seat;Wheelchair - power;Wheelchair - manual;Hospital bed;Other (comment) Additional Comments: using sliding board for transfers to power wheelchair    Prior Function Level of Independence: Needs assistance   Gait / Transfers Assistance Needed: nonambulatory, uses sliding board with 1 person assist for bed<>w/c transfers     Comments: Pt reports using RW for short distances with therapy only until May 2019     Hand Dominance   Dominant Hand: Right    Extremity/Trunk Assessment   Upper Extremity Assessment Upper Extremity Assessment: Defer to OT evaluation    Lower  Extremity Assessment Lower Extremity Assessment: (Minimal volitional movement throughout bil LEs; no sensation intact; occasional spasms)       Communication   Communication: No difficulties  Cognition Arousal/Alertness: (P) Awake/alert Behavior During Therapy: (P) WFL for tasks assessed/performed Overall Cognitive Status: (P) Within Functional Limits for tasks assessed                                         General Comments General comments (skin integrity, edema, etc.): Had a BM, RN aware    Exercises     Assessment/Plan    PT Assessment Patient needs continued PT services  PT Problem List Decreased strength;Decreased activity tolerance;Decreased balance;Decreased mobility;Impaired sensation;Impaired tone;Decreased skin integrity       PT Treatment Interventions DME instruction;Functional mobility training;Therapeutic activities;Therapeutic exercise;Balance training;Neuromuscular re-education;Patient/family education;Wheelchair mobility training    PT Goals (Current goals can be found in the Care Plan section)  Acute Rehab PT Goals Patient Stated Goal: (P) get wounds healed and move better PT Goal Formulation: With patient Time For Goal Achievement: 08/24/18 Potential to Achieve Goals: Good    Frequency Min 3X/week   Barriers to discharge        Co-evaluation               AM-PAC PT "6 Clicks" Mobility  Outcome Measure Help needed turning from your back to your side while in a flat bed without using bedrails?: A Lot Help needed moving from lying on your back to sitting on the side of a flat bed without using bedrails?: A Lot Help needed moving to and from a bed to a chair (including a wheelchair)?: A Lot Help needed standing up from a chair using your arms (e.g., wheelchair or bedside chair)?: Total Help needed to walk in hospital room?: Total Help needed climbing 3-5 steps with a railing? : Total 6 Click Score: 9    End of Session Equipment Utilized During Treatment: (bed pad) Activity Tolerance: Patient tolerated treatment well Patient left: in bed;with call bell/phone within reach;Other (comment)(Rn preparing to give her an enema) Nurse Communication: Mobility status PT Visit Diagnosis: Other abnormalities of gait and mobility (R26.89);Muscle weakness (generalized) (M62.81);Other (comment)(Dec skin integrity)    Time: 9211-9417 PT Time Calculation (min) (ACUTE  ONLY): 21 min   Charges:   PT Evaluation $PT Eval Moderate Complexity: 1 Mod          Sarah [name], [place]  Acute Rehabilitation Services Pager [phone] Office [phone]   Sarah [name] 08/10/2018, 3:40 PM

## 2018-08-10 NOTE — Consult Note (Signed)
Sarah [name] 10-07-1965  [id number].    Requesting MD: Dr. 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  She has multiple other known medical problems as well.  She has been paraplegic for about the last year.  She spends most of her day in her wheelchair.  She noticed earlier this year around March that she began to have some wounds on her backside.  She was initially told that this was yeast and given creams and powders.  However, this did not seem to help and these have progressed.  She is currently admitted to the hospital with multiple stage IV ulcers.  We have been asked to evaluate her for debridement and further recommendations.  ROS: ROS: Please see HPI otherwise all other systems are currently negative.  Family History  Problem Relation Age of Onset  . Hypertension Mother   . Diabetes Mother   . Hypertension Father   . Stroke Father   . Diabetes Father   . Diabetes Sister   . Diabetes Sister     Past Medical History:  Diagnosis Date  . Anxiety   . Cancer [facility])    FNA of thyroid positive for onconytic/hurthle cell carcinoma  . Chronic back pain   . Complication of anesthesia    Nausea and vomiting  . DDD (degenerative disc disease), cervical   . Depression   . DJD (degenerative joint disease)   . Family history of adverse reaction to anesthesia    MOTHER HAS NAUSEA   . GERD (gastroesophageal reflux disease)   . History of blood transfusion    08/2017  . History of rectal fissure   . HIT (heparin-induced thrombocytopenia) ([place])   . Hypertension    05/27/2018- not current- runs low  . Hypothyroidism   . Obesity   . Paraplegia at T4 level ([place])   . Pneumonia 03/2018  . Pulmonary embolus, right ([place]) 2015  . Rotator cuff tendonitis right    Past  Surgical History:  Procedure Laterality Date  . APPLICATION OF CRANIAL NAVIGATION N/A 06/01/2018   Procedure: APPLICATION OF CRANIAL NAVIGATION;  Surgeon: Erline Levine, MD;  Location: [place];  Service: Neurosurgery;  Laterality: N/A;  . CHOLECYSTECTOMY N/A 01/25/2018   Procedure: LAPAROSCOPIC CHOLECYSTECTOMY;  Surgeon: Virl Cagey, MD;  Location: [facility];  Service: General;  Laterality: N/A;  . CRANIOTOMY N/A 06/01/2018   Procedure: Craniectomy for skull metastasis with Brainlab;  Surgeon: Erline Levine, MD;  Location: [place];  Service: Neurosurgery;  Laterality: N/A;  . FEMUR IM NAIL Left 09/11/2017   Procedure: INTRAMEDULLARY (IM) RETROGRADE FEMORAL NAILING;  Surgeon: Altamese Haskell, MD;  Location: [place];  Service: Orthopedics;  Laterality: Left;  . LAMINECTOMY N/A 12/14/2013   Procedure: THORACIC LAMINECTOMY FOR TUMOR THORACIC THREE;  Surgeon: Ashok Pall, MD;  Location: [place] [facility];  Service: Neurosurgery;  Laterality: N/A;  THORACIC LAMINECTOMY FOR TUMOR THORACIC THREE  . LAMINECTOMY N/A 07/05/2015   Procedure: THORACIC LAMINECTOMY FOR TUMOR;  Surgeon: Ashok Pall, MD;  Location: [place] [facility];  Service: Neurosurgery;  Laterality: N/A;  . LAMINECTOMY N/A 09/03/2017   Procedure: POSTERIOR SPINAL TUMOR RESECTION THORACIC THREE;  Surgeon: Ashok Pall, MD;  Location: [facility] [place];  Service: Neurosurgery;  Laterality: N/A;  POSTERIOR SPINAL TUMOR RESECTION THORACIC THREE  . POSTERIOR LUMBAR FUSION 4 LEVEL  N/A 12/30/2013   Procedure: Thoracic one-Thoracic five posterior thoracic fusion with pedicle screws;  Surgeon: Ashok Pall, MD;  Location: [place] [facility];  Service: Neurosurgery;  Laterality: N/A;  Thoracic one-Thoracic five posterior thoracic fusion with pedicle screws  . THYROIDECTOMY N/A 01/09/2014   Procedure: TOTAL THYROIDECTOMY;  Surgeon: Izora Gala, MD;  Location: [facility];  Service: ENT;  Laterality: N/A;  . TONSILLECTOMY      Social History:  reports that she has never smoked. She has never  used smokeless tobacco. She reports that she does not drink alcohol or use drugs.  Allergies:  Allergies  Allergen Reactions  . Bee Venom Anaphylaxis  . Heparin Other (See Comments)    Weak positive platelets induced antibodies, SRA negative--2015  . Keflex [Cephalexin] Nausea And Vomiting  . Tramadol Nausea And Vomiting  . Gabapentin Nausea And Vomiting  . Hydrochlorothiazide Nausea And Vomiting  . Hydrocodone Nausea And Vomiting  . Oxycodone-Acetaminophen Nausea And Vomiting  . Sorbitol Nausea Only    And abdominal distress  . Tizanidine     Fatigue     Medications Prior to Admission  Medication Sig Dispense Refill  . acetaminophen (TYLENOL) 325 MG tablet Take 2 tablets (650 mg total) by mouth every 4 (four) hours as needed for mild pain ((score 1 to 3) or temp > 100.5).    [name] Kitchen albuterol (PROVENTIL) (2.5 MG/3ML) 0.083% nebulizer solution Take 3 mLs (2.5 mg total) by nebulization every 6 (six) hours as needed for wheezing or shortness of breath. 75 mL 12  . baclofen (LIORESAL) 20 MG tablet Take 1 tablet (20 mg total) by mouth 2 (two) times daily. 60 tablet 4  . cetirizine (ZYRTEC) 10 MG tablet Take 1 tablet (10 mg total) by mouth daily. 30 tablet 11  . fluticasone (FLONASE) 50 MCG/ACT nasal spray Place 2 sprays into both nostrils daily. 16 g 6  . HYDROcodone-acetaminophen (NORCO/VICODIN) 5-325 MG tablet Take 1 tablet by mouth every 8 (eight) hours as needed for up to 10 days for moderate pain. 30 tablet 0  . ipratropium-albuterol (DUONEB) 0.5-2.5 (3) MG/3ML SOLN Take 3 mLs by nebulization 3 (three) times daily. 360 mL   . levothyroxine (SYNTHROID) 150 MCG tablet Take 1 tablet (150 mcg total) by mouth daily before breakfast. 30 tablet 1  . Multiple Vitamin (MULTIVITAMIN WITH MINERALS) TABS tablet Take 1 tablet by mouth daily.    [name] Kitchen nystatin (MYCOSTATIN/NYSTOP) powder Apply topically 4 (four) times daily. To groin. 60 g 3  . ondansetron (ZOFRAN) 4 MG tablet Take 1 tablet by mouth every 6  (six) hours as needed for nausea or vomiting.     [name] Kitchen oxymetazoline (AFRIN NASAL SPRAY) 0.05 % nasal spray Place 1 spray into both nostrils 2 (two) times daily. 30 mL 0  . pantoprazole (PROTONIX) 40 MG tablet Take 1 tablet (40 mg total) by mouth daily. (Patient taking differently: Take 40 mg by mouth daily. ) 30 tablet 0  . polyethylene glycol (MIRALAX / GLYCOLAX) packet Take 17 g by mouth daily. 14 each 0  . rivaroxaban (XARELTO) 20 MG TABS tablet Take 1 tablet (20 mg total) by mouth daily with supper. 30 tablet 3  . senna (SENOKOT) 8.6 MG TABS tablet Take 1 tablet (8.6 mg total) by mouth at bedtime as needed for mild constipation. 120 each 0  . sertraline (ZOLOFT) 50 MG tablet Take 50 mg by mouth daily.     . fluconazole (DIFLUCAN) 100 MG tablet Take 1 tablet (100 mg total) by mouth daily.  14 tablet 0     Physical Exam: Blood pressure 104/60, pulse 64, temperature 97.6 F (36.4 C), temperature source Oral, resp. rate 18, height 5\' 11"  (1.803 m), weight 100.4 kg, last menstrual period 10/22/2016, SpO2 100 %. General: pleasant, WD, WN white female who is laying in bed in NAD HEENT: head is normocephalic, atraumatic.  Sclera are noninjected.  PERRL.  Ears and nose without any masses or lesions.  Mouth is pink and moist Abd: soft, NT, ND, +BS, no masses, hernias, or organomegaly Skin: warm and dry with no masses, lesions, or rashes.  However she does have a large 8 x 6 x 3 cm right inferior gluteal stage IV ulcer.  This is essentially 100% necrotic tissue.  She also has a 4 x 4 x 1 cm left inferior gluteal stage IV ulcer as well.  Please see debridement note. Psych: A&Ox3 with an appropriate affect.   Results for orders placed or performed during the hospital encounter of 08/09/18 (from the past 48 hour(s))  Comprehensive metabolic panel     Status: Abnormal   Collection Time: 08/09/18  9:29 PM  Result Value Ref Range   Sodium 135 135 - 145 mmol/L   Potassium 4.1 3.5 - 5.1 mmol/L   Chloride 98  98 - 111 mmol/L   CO2 25 22 - 32 mmol/L   Glucose, Bld 145 (H) 70 - 99 mg/dL   BUN 17 6 - 20 mg/dL   Creatinine, Ser 1.15 (H) 0.44 - 1.00 mg/dL   Calcium 8.3 (L) 8.9 - 10.3 mg/dL   Total Protein 6.8 6.5 - 8.1 g/dL   Albumin 2.6 (L) 3.5 - 5.0 g/dL   AST 29 15 - 41 U/L   ALT 24 0 - 44 U/L   Alkaline Phosphatase 387 (H) 38 - 126 U/L   Total Bilirubin 1.0 0.3 - 1.2 mg/dL   GFR calc non Af Amer 55 (L) >60 mL/min   GFR calc Af Amer >60 >60 mL/min   Anion gap 12 5 - 15    Comment: Performed at [facility], [address] [place], [place] [address]  Lactic acid, plasma     Status: None   Collection Time: 08/09/18  9:29 PM  Result Value Ref Range   Lactic Acid, Venous 1.9 0.5 - 1.9 mmol/L    Comment: Performed at [facility] [address], [place], [place] [address]  CBC with Differential     Status: Abnormal   Collection Time: 08/09/18  9:29 PM  Result Value Ref Range   WBC 6.3 4.0 - 10.5 K/uL   RBC 3.10 (L) 3.87 - 5.11 MIL/uL   Hemoglobin 9.1 (L) 12.0 - 15.0 g/dL   HCT 28.9 (L) 36.0 - 46.0 %   MCV 93.2 80.0 - 100.0 fL   MCH 29.4 26.0 - 34.0 pg   MCHC 31.5 30.0 - 36.0 g/dL   RDW 13.4 11.5 - 15.5 %   Platelets 266 150 - 400 K/uL   nRBC 0.0 0.0 - 0.2 %   Neutrophils Relative % 95 %   Neutro Abs 6.0 1.7 - 7.7 K/uL   Lymphocytes Relative 2 %   Lymphs Abs 0.2 (L) 0.7 - 4.0 K/uL   Monocytes Relative 1 %   Monocytes Absolute 0.0 (L) 0.1 - 1.0 K/uL   Eosinophils Relative 1 %   Eosinophils Absolute 0.1 0.0 - 0.5 K/uL   Basophils Relative 0 %   Basophils Absolute 0.0 0.0 - 0.1 K/uL  Smear Review MORPHOLOGY UNREMARKABLE    Immature Granulocytes 1 %   Abs Immature Granulocytes 0.06 0.00 - 0.07 K/uL    Comment: Performed at [facility], [place] [address], [place], [place] [address]  Protime-INR     Status: Abnormal   Collection Time: 08/09/18  9:29 PM  Result Value Ref Range   Prothrombin Time 23.3 (H) 11.4 - 15.2 seconds   INR 2.1 (H) 0.8 - 1.2    Comment:  (NOTE) INR goal varies based on device and disease states. Performed at [facility], [place] [address], [place], [place] [address]   Culture, blood (Routine x 2)     Status: None (Preliminary result)   Collection Time: 08/09/18  9:29 PM  Result Value Ref Range   Specimen Description BLOOD LEFT ANTECUBITAL    Special Requests      BOTTLES DRAWN AEROBIC AND ANAEROBIC Blood Culture results may not be optimal due to an inadequate volume of blood received in culture bottles   Culture      NO GROWTH < 12 HOURS Performed at [facility] [address], [place],  [address]    Report Status PENDING   SARS Coronavirus 2 (CEPHEID - Performed in [place] hospital lab), Hosp Order     Status: None   Collection Time: 08/10/18  1:25 AM  Result Value Ref Range   SARS Coronavirus 2 NEGATIVE NEGATIVE    Comment: (NOTE) If result is NEGATIVE SARS-CoV-2 target nucleic acids are NOT DETECTED. The SARS-CoV-2 RNA is generally detectable in upper and lower  respiratory specimens during the acute phase of infection. The lowest  concentration of SARS-CoV-2 viral copies this assay can detect is 250  copies / mL. A negative result does not preclude SARS-CoV-2 infection  and should not be used as the sole basis for treatment or other  patient management decisions.  A negative result may occur with  improper specimen collection / handling, submission of specimen other  than nasopharyngeal swab, presence of viral mutation(s) within the  areas targeted by this assay, and inadequate number of viral copies  (<250 copies / mL). A negative result must be combined with clinical  observations, patient history, and epidemiological information. If result is POSITIVE SARS-CoV-2 target nucleic acids are DETECTED. The SARS-CoV-2 RNA is generally detectable in upper and lower  respiratory specimens dur ing the acute phase of infection.  Positive  results are indicative of active infection with  SARS-CoV-2.  Clinical  correlation with patient history and other diagnostic information is  necessary to determine patient infection status.  Positive results do  not rule out bacterial infection or co-infection with other viruses. If result is PRESUMPTIVE POSTIVE SARS-CoV-2 nucleic acids MAY BE PRESENT.   A presumptive positive result was obtained on the submitted specimen  and confirmed on repeat testing.  While 2019 novel coronavirus  (SARS-CoV-2) nucleic acids may be present in the submitted sample  additional confirmatory testing may be necessary for epidemiological  and / or clinical management purposes  to differentiate between  SARS-CoV-2 and other Sarbecovirus currently known to infect humans.  If clinically indicated additional testing with an alternate test  methodology [phone]) is advised. The SARS-CoV-2 RNA is generally  detectable in upper and lower respiratory sp ecimens during the acute  phase of infection. The expected result is Negative. Fact Sheet for Patients:  StrictlyIdeas.no Fact Sheet for Healthcare Providers: BankingDealers.co.za This test is not yet approved or cleared by the [place]  FDA and has been authorized for detection and/or diagnosis of SARS-CoV-2 by FDA under an Emergency Use Authorization (EUA).  This EUA will remain in effect (meaning this test can be used) for the duration of the COVID-19 declaration under Section 564(b)(1) of the Act, 21 U.S.C. section 360bbb-3(b)(1), unless the authorization is terminated or revoked sooner. Performed at [facility], [place] [address], [place], [place] [address]   Urinalysis, Routine w reflex microscopic     Status: Abnormal   Collection Time: 08/10/18  1:45 AM  Result Value Ref Range   Color, Urine AMBER (A) YELLOW    Comment: BIOCHEMICALS MAY BE AFFECTED BY COLOR   APPearance CLOUDY (A) CLEAR   Specific Gravity, Urine 1.017 1.005 - 1.030   pH 6.0  5.0 - 8.0   Glucose, UA NEGATIVE NEGATIVE mg/dL   Hgb urine dipstick SMALL (A) NEGATIVE   Bilirubin Urine NEGATIVE NEGATIVE   Ketones, ur NEGATIVE NEGATIVE mg/dL   Protein, ur 30 (A) NEGATIVE mg/dL   Nitrite NEGATIVE NEGATIVE   Leukocytes,Ua MODERATE (A) NEGATIVE   RBC / HPF 0-5 0 - 5 RBC/hpf   WBC, UA >50 (H) 0 - 5 WBC/hpf   Bacteria, UA MANY (A) NONE SEEN   Mucus PRESENT     Comment: Performed at [facility], [address], [place], [place] [address]  MRSA PCR Screening     Status: None   Collection Time: 08/10/18  3:46 AM  Result Value Ref Range   MRSA by PCR NEGATIVE NEGATIVE    Comment:        The GeneXpert MRSA Assay (FDA approved for NASAL specimens only), is one component of a comprehensive MRSA colonization surveillance program. It is not intended to diagnose MRSA infection nor to guide or monitor treatment for MRSA infections. Performed at [facility], [place] [address] [place], [place] [address]   Basic metabolic panel     Status: Abnormal   Collection Time: 08/10/18  4:14 AM  Result Value Ref Range   Sodium 133 (L) 135 - 145 mmol/L   Potassium 3.2 (L) 3.5 - 5.1 mmol/L    Comment: DELTA CHECK NOTED   Chloride 100 98 - 111 mmol/L   CO2 24 22 - 32 mmol/L   Glucose, Bld 114 (H) 70 - 99 mg/dL   BUN 16 6 - 20 mg/dL   Creatinine, Ser 1.23 (H) 0.44 - 1.00 mg/dL   Calcium 7.4 (L) 8.9 - 10.3 mg/dL   GFR calc non Af Amer 50 (L) >60 mL/min   GFR calc Af Amer 58 (L) >60 mL/min   Anion gap 9 5 - 15    Comment: Performed at [facility], [place] [address] [place], [place] [address]  CBC     Status: Abnormal   Collection Time: 08/10/18  4:14 AM  Result Value Ref Range   WBC 16.8 (H) 4.0 - 10.5 K/uL   RBC 2.39 (L) 3.87 - 5.11 MIL/uL   Hemoglobin 7.0 (L) 12.0 - 15.0 g/dL    Comment: REPEATED TO VERIFY   HCT 21.9 (L) 36.0 - 46.0 %   MCV 91.6 80.0 - 100.0 fL   MCH 29.3 26.0 - 34.0 pg   MCHC 32.0 30.0 - 36.0 g/dL   RDW 13.4 11.5 - 15.5 %   Platelets  227 150 - 400 K/uL   nRBC 0.0 0.0 - 0.2 %    Comment: Performed at [facility], [place],  [place] [address]  Hemoglobin and hematocrit, blood     Status: Abnormal   Collection Time: 08/10/18 10:15 AM  Result Value Ref Range   Hemoglobin 7.5 (L) 12.0 - 15.0 g/dL   HCT 23.6 (L) 36.0 - 46.0 %    Comment: Performed at [facility] [address] [place], [place] [address]   *Note: Due to a large number of results and/or encounters for the requested time period, some results have not been displayed. A complete set of results can be found in Results Review.   Dg Chest 2 View  Result Date: 08/09/2018 CLINICAL DATA:  53 year old female with suspected sepsis. Metastatic Hurthle cell thyroid carcinoma. EXAM: CHEST - 2 VIEW COMPARISON:  PET-CT 05/13/2018 and earlier. FINDINGS: AP and lateral views of the chest. Mildly lower lung volumes compared to 2018. Stable cardiac size and mediastinal contours. No pneumothorax, pulmonary edema, pleural effusion or confluent pulmonary opacity. Left greater than right pleural effusions have resolved since February. Visualized tracheal air column is within normal limits. Chronic cervicothoracic/upper thoracic spinal hardware. Stable visualized osseous structures. Negative visible bowel gas pattern. IMPRESSION: No acute cardiopulmonary abnormality. Electronically Signed   By: Genevie Ann M.D.   On: 08/09/2018 22:04   Ct Pelvis W Contrast  Result Date: 08/10/2018 CLINICAL DATA:  53 year old female with suspected sepsis. Metastatic Hurthle cell thyroid carcinoma. Decubitus ulcers, query osteomyelitis. EXAM: CT PELVIS WITH CONTRAST TECHNIQUE: Multidetector CT imaging of the pelvis was performed using the standard protocol following the bolus administration of intravenous contrast. CONTRAST:  140mL OMNIPAQUE IOHEXOL 300 MG/ML  SOLN COMPARISON:  PET-CT 05/13/2018. CT abdomen, and Pelvis 03/03/2019. FINDINGS: Urinary Tract: Foley catheter in place.  Urinary bladder decompressed. Visible ureters appear normal. Bowel: Retained stool in the distal large bowel including a 7 centimeter stool ball in the rectum. Mild presacral stranding. No pelvic free fluid. Visible large bowel loops are nondilated. Negative appendix. Vascular/Lymphatic: The visible Major arterial structures and central pelvic veins appear to be enhancing and patent. No lymphadenopathy. Reproductive:  Negative. Other:  No pelvic free fluid. Musculoskeletal: Right inferior pubic ramus region gas containing skin wound on series 3, image 53 is new since the February PET-CT. No organized or drainable fluid collection is associated. The wound extends to near the periosteum of the inferior pubic ramus but there are no CT changes of osteomyelitis identified. Stable visualized osseous structures. Previous left femur ORIF. There is scattered additional patchy subcutaneous stranding including overlying the lower lumbar spine on series 3, image 1. But no other confluent decubitus wound. IMPRESSION: 1. Right inferior pubic ramus region gas containing decubitus wound is new since the February PET-CT and is in proximity to the right inferior pubic ramus but there is no CT evidence of osteomyelitis. 2. Lesser areas of decubitus soft tissue inflammation elsewhere. 3. Stool ball in the rectum, consider fecal impaction. Electronically Signed   By: Genevie Ann M.D.   On: 08/10/2018 02:34      Assessment/Plan Bilateral stage IV inferior gluteal decubitus ulcers The patient has bilateral stage IV decubitus ulcers.  These were debrided some at the bedside with minimal bleeding.  Santyl will be ordered as well as normal saline wet-to-dry dressing changes twice a day.  We will also order PT hydrotherapy to continue helping to clean this wound up.  She may need more bedside debridements in the future.  Her Xarelto has been on hold since Sunday.  We will continue to follow this patient and continue to clean up these  wounds.  Agree with air overlay mattress which is currently being delivered as well as frequent turning.  Maximize protein and nutrition for healing as well. FEN - regular VTE - xarelto on hold ID - [email or web address]/Zosyn  Henreitta Cea, [facility] 08/10/2018, 12:55 PM Pager: [phone]

## 2018-08-10 NOTE — Consult Note (Signed)
WOC will sign off for ischial wounds at this time. CCS is managing with bedside debridements and hydrotherapy with PT.   Topical care for foot and ankle wounds ordered as well as offloading   Added consultation from RD, related to wound healing   [place], [place], [place]

## 2018-08-10 NOTE — Discharge Summary (Addendum)
FMTS Attending Daily Note: Sarah Singh, MD  Pager [phone]  Office [phone] I have seen and examined this patient, reviewed their chart. I have discussed this patient with the resident. I agree with the resident's findings, assessment and care plan.  - End of antibiotics 5/28 - Monitor for signs of bleeding - Patient had Foley catheter during hospitalization due to wounds--- monitor urine output  - Patient needs outpatient GI consultation for CSY and EGD given chronic anemia (last in 2018)    [facility] Discharge Summary  Patient name: Sarah [name] Medical record number: [id number] Date of birth: 1965/10/31 Age: 53 y.o. Gender: female Date of Admission: 08/09/2018  Date of Discharge: 08/17/2018 Admitting Physician: Leeanne Rio, MD  Primary Care Provider: [name], MD Consultants: General Surgery  Indication for Hospitalization: Gluteal Wounds and Fever  Discharge Diagnoses/Problem List:  Fever 2/2 gluteal decubitus ulcers Paraplegia Metastatic hurthle cell carcinoma Normocytic anemia Constipation Hx of DVT w/ PE Vaginal candidiasis GERD Depression Hypothyroidism Seasonal allergies  Disposition: CIR  Discharge Condition: improved, stable  Discharge Exam: BP (!) 86/56 (BP Location: Left Arm)   Pulse 60   Temp 98.2 F (36.8 C) (Oral)   Resp 18   Ht 5\' 11"  (1.803 m)   Wt 101.3 kg   LMP 10/22/2016 (LMP Unknown)   SpO2 99%   BMI 31.15 kg/m  General: alert and oriented. No acute distress.  Cardiovascular: Regular rhythm. Normal rate. No murmurs.  Respiratory: LCTAB. No wheezes or crackles.  Abdomen: very mild diffuse abdominal tenderness.   Extremities: no edema.   Brief Hospital Course:  Sarah [name] is a 53 y.o. female presenting with worsening of bilateral gluteal ulcers and fever concerning for bacterial wound infection . PMH is significant for paraplegia, metastatic cancer,  GERD, depression, Hx DVT, PE on  Xarelto.  Her hospital course is outlined below.  Sacral Wounds Patient presented with worsening of chronic gluteal wounds.  CT without evidence of osteo, but some gas in wound.  Febrile to 102.5 in ED and was started on Vancomycin and Zosyn IV.  General surgery consulted and debrided wound on 5/19.  Initial blood cultures showed anerobic tube with gram positive cocci, but ID negative.  Repeat blood culture drawn on 5/21 and were negative.  Urine culture >100,000 Klebsiella, resistant to ampicillin and Macrobid.  Patient was transitioned to vanc, cefepime and flagyl on 5/21.  On 5/24, she received 1 dose of CTX to finish treatment for Klebsiella UTI and was transitioned to clindamycin for gas forming organisms.  Stop date Clindamycin 5/28---considering extending therapy pending wounds.   Iron Deficiney Anemia Patient has a history of anemia and requires anticoagulation for history of VTE.  She has also had negative GI evaluation in the past.  Prior anemia panel suggested IDA.  Patient had Hgb 6.1 which required 1u pRBC on 5/20, post-trasnfusion H&H was 8.1.  She was also started on ferrous sulfate QOD. Hgb remaind stable throughout the remainder of her hospitalization. At the time of discharge Hgb was 8.9  Constipation - the patient complained of chronic constipation and there was a large stool burden seen on imaging.  She was given glycerin suppository, miralax, and senna. She had several stools the day before discharge and glycerin and miralax was discontinued.   Patient's other chronic conditions were stable and managed with her home medications.    Issues for Follow Up:  1. Wound care - pt will need continued wound care during inpatient rehab and  home health wound care after discharge.  2. abx - continue clindamycin TID, last day 5/28.   3. Needs outpatient GI f/u for anemia.  Has had prior GI workup in hospital, but no scope.  Repeat CBC and BMP weekly.  4. Vaginal candidiasis - has been using  nystatin powder PRN.  Consider adding fluconazole if not improved.   5. Constipation - pt has tendency for constipation.  May need a daily miralax when she goes home.    Significant Procedures: none  Significant Labs and Imaging:  Recent Labs  Lab 08/15/18 0811 08/16/18 0438 08/17/18 0403  WBC 4.5 4.3 4.5  HGB 8.0* 8.5* 8.9*  HCT 26.0* 27.1* 28.0*  PLT 212 226 217   Recent Labs  Lab 08/12/18 0353 08/13/18 0422 08/14/18 0510 08/15/18 0811 08/17/18 0403  NA 139 140 141 140 137  K 4.2 3.9 4.5 3.8 3.7  CL 107 105 106 106 103  CO2 22 23 19* 20* 24  GLUCOSE 82 75 74 71 70  BUN 14 9 13 13 9   CREATININE 1.12* 1.24* 1.18* 1.12* 1.11*  CALCIUM 7.5* 7.7* 7.9* 7.8* 8.1*     Dg Chest 2 View  Result Date: 08/09/2018 CLINICAL DATA:  54 year old female with suspected sepsis. Metastatic Hurthle cell thyroid carcinoma. EXAM: CHEST - 2 VIEW COMPARISON:  PET-CT 05/13/2018 and earlier. FINDINGS: AP and lateral views of the chest. Mildly lower lung volumes compared to 2018. Stable cardiac size and mediastinal contours. No pneumothorax, pulmonary edema, pleural effusion or confluent pulmonary opacity. Left greater than right pleural effusions have resolved since February. Visualized tracheal air column is within normal limits. Chronic cervicothoracic/upper thoracic spinal hardware. Stable visualized osseous structures. Negative visible bowel gas pattern. IMPRESSION: No acute cardiopulmonary abnormality. Electronically Signed   By: Genevie Ann M.D.   On: 08/09/2018 22:04   Ct Pelvis W Contrast  Result Date: 08/10/2018 CLINICAL DATA:  53 year old female with suspected sepsis. Metastatic Hurthle cell thyroid carcinoma. Decubitus ulcers, query osteomyelitis. EXAM: CT PELVIS WITH CONTRAST TECHNIQUE: Multidetector CT imaging of the pelvis was performed using the standard protocol following the bolus administration of intravenous contrast. CONTRAST:  162mL OMNIPAQUE IOHEXOL 300 MG/ML  SOLN COMPARISON:  PET-CT  05/13/2018. CT abdomen, and Pelvis 03/03/2019. FINDINGS: Urinary Tract: Foley catheter in place. Urinary bladder decompressed. Visible ureters appear normal. Bowel: Retained stool in the distal large bowel including a 7 centimeter stool ball in the rectum. Mild presacral stranding. No pelvic free fluid. Visible large bowel loops are nondilated. Negative appendix. Vascular/Lymphatic: The visible Major arterial structures and central pelvic veins appear to be enhancing and patent. No lymphadenopathy. Reproductive:  Negative. Other:  No pelvic free fluid. Musculoskeletal: Right inferior pubic ramus region gas containing skin wound on series 3, image 53 is new since the February PET-CT. No organized or drainable fluid collection is associated. The wound extends to near the periosteum of the inferior pubic ramus but there are no CT changes of osteomyelitis identified. Stable visualized osseous structures. Previous left femur ORIF. There is scattered additional patchy subcutaneous stranding including overlying the lower lumbar spine on series 3, image 1. But no other confluent decubitus wound. IMPRESSION: 1. Right inferior pubic ramus region gas containing decubitus wound is new since the February PET-CT and is in proximity to the right inferior pubic ramus but there is no CT evidence of osteomyelitis. 2. Lesser areas of decubitus soft tissue inflammation elsewhere. 3. Stool ball in the rectum, consider fecal impaction. Electronically Signed   By: Genevie Ann  M.D.   On: 08/10/2018 02:34   Results/Tests Pending at Time of Discharge: none  Discharge Medications:  Allergies as of 08/17/2018      Reactions   Bee Venom Anaphylaxis   Heparin Other (See Comments)   Weak positive platelets induced antibodies, SRA negative--2015   Keflex [cephalexin] Nausea And Vomiting   Tramadol Nausea And Vomiting   Gabapentin Nausea And Vomiting   Hydrochlorothiazide Nausea And Vomiting   Hydrocodone Nausea And Vomiting    Oxycodone-acetaminophen Nausea And Vomiting   Sorbitol Nausea Only   And abdominal distress   Tizanidine    Fatigue      Medication List    STOP taking these medications   fluconazole 100 MG tablet Commonly known as:  DIFLUCAN   HYDROcodone-acetaminophen 5-325 MG tablet Commonly known as:  NORCO/VICODIN   ipratropium-albuterol 0.5-2.5 (3) MG/3ML Soln Commonly known as:  DUONEB   ondansetron 4 MG tablet Commonly known as:  ZOFRAN   oxymetazoline 0.05 % nasal spray Commonly known as:  Afrin Nasal Spray   polyethylene glycol 17 g packet Commonly known as:  MIRALAX / GLYCOLAX     TAKE these medications   acetaminophen 325 MG tablet Commonly known as:  TYLENOL Take 2 tablets (650 mg total) by mouth every 6 (six) hours as needed for mild pain (or Fever >/= 101). What changed:    when to take this  reasons to take this   acetaminophen 650 MG suppository Commonly known as:  TYLENOL Place 1 suppository (650 mg total) rectally every 6 (six) hours as needed for mild pain (or Fever >/= 101). What changed:  You were already taking a medication with the same name, and this prescription was added. Make sure you understand how and when to take each.   albuterol (2.5 MG/3ML) 0.083% nebulizer solution Commonly known as:  PROVENTIL Take 3 mLs (2.5 mg total) by nebulization every 6 (six) hours as needed for wheezing or shortness of breath.   baclofen 20 MG tablet Commonly known as:  LIORESAL Take 1 tablet (20 mg total) by mouth 2 (two) times daily.   cetirizine 10 MG tablet Commonly known as:  ZYRTEC Take 1 tablet (10 mg total) by mouth daily.   clindamycin 300 MG capsule Commonly known as:  CLEOCIN Take 2 capsules (600 mg total) by mouth every 8 (eight) hours for 7 doses.   collagenase ointment Commonly known as:  SANTYL Apply topically daily.   feeding supplement (ENSURE ENLIVE) Liqd Take 237 mLs by mouth 2 (two) times daily between meals.   nutrition supplement  (JUVEN) Pack Take 1 packet by mouth 2 (two) times daily between meals.   ferrous sulfate 325 (65 FE) MG tablet Take 1 tablet (325 mg total) by mouth every other day.   fluticasone 50 MCG/ACT nasal spray Commonly known as:  FLONASE Place 2 sprays into both nostrils daily.   levothyroxine 150 MCG tablet Commonly known as:  SYNTHROID Take 1 tablet (150 mcg total) by mouth daily before breakfast.   multivitamin with minerals Tabs tablet Take 1 tablet by mouth daily.   nystatin powder Commonly known as:  MYCOSTATIN/NYSTOP Apply topically 4 (four) times daily. To groin.   pantoprazole 40 MG tablet Commonly known as:  PROTONIX Take 1 tablet (40 mg total) by mouth daily. What changed:  See the new instructions.   promethazine 12.5 MG tablet Commonly known as:  PHENERGAN Take 1 tablet (12.5 mg total) by mouth every 6 (six) hours as needed for nausea.  rivaroxaban 20 MG Tabs tablet Commonly known as:  Xarelto Take 1 tablet (20 mg total) by mouth daily with supper.   senna 8.6 MG Tabs tablet Commonly known as:  SENOKOT Take 1 tablet (8.6 mg total) by mouth at bedtime as needed for mild constipation.   sertraline 50 MG tablet Commonly known as:  ZOLOFT Take 50 mg by mouth daily.       Discharge Instructions: Please refer to Patient Instructions section of EMR for full details.  Patient was counseled important signs and symptoms that should prompt return to medical care, changes in medications, dietary instructions, activity restrictions, and follow up appointments.   Follow-Up Appointments: Follow-up Information    [name], MD Follow up.   Specialty:  Family Medicine Why:  please make an appointment with your PCP after your discharge from inpatient rehab.  Contact information: [place] [id number] [phone]           [name], MD 08/17/2018, 1:56 PM PGY-1, [place]

## 2018-08-10 NOTE — Evaluation (Signed)
Occupational Therapy Evaluation Patient Details Name: Sarah [name] MRN: [id number] DOB: 1965/07/21 Today's Date: 08/10/2018    History of Present Illness This is a 53 year old female with a history of Hurthle cell carcinoma of the thyroid with metastatic disease head and lumbar spine resulting in paraplegia for approx 1 year or greater; Admitted with multiple ischial decubitus ulcers; Hydrotherapy ordered   Clinical Impression   Pt admitted with above. She demonstrates the below listed deficits and will benefit from continued OT to maximize safety and independence with BADLs.  OT eval limited by pain Lt breast area which limits her ability to use UEs and perform transfers.  She currently requires set up for self feeding and grooming,  Mod- max A for UB ADLs, and total A for LB ADLs.  She lives with her sister and brother in law, who have been assisting her with all aspects bathing/dressing, toileting, and functional mobility due to recent onset of pain Lt breast/chest.  She is eager to improve and return home.  Recommend CIR level therapies       Follow Up Recommendations  Supervision/Assistance - 24 hour;CIR    Equipment Recommendations  None recommended by OT    Recommendations for Other Services       Precautions / Restrictions Precautions Precautions: Fall Precaution Comments: Watch for shear with transfers      Mobility Bed Mobility Overal bed mobility: Needs Assistance Bed Mobility: Rolling;Sidelying to Sit;Sit to Sidelying Rolling: Mod assist;+2 for physical assistance Sidelying to sit: Mod assist;+2 for physical assistance     Sit to sidelying: Mod assist;+2 for physical assistance General bed mobility comments: Heavy mod assist and use of bed pad to cradle hips and stabilize LEs to roll and come to sit  Transfers Overall transfer level: Needs assistance   Transfers: Lateral/Scoot Transfers          Lateral/Scoot Transfers: +2 physical assistance;Mod  assist General transfer comment: Simulated lateral scoot transfer, scooting to [place], using bed pad to minimize shear    Balance Overall balance assessment: Needs assistance   Sitting balance-Leahy Scale: Poor Sitting balance - Comments: Dependent on UE support sitting EOB; occasional LE spasms can throw her balance off                                   ADL either performed or assessed with clinical judgement   ADL Overall ADL's : Needs assistance/impaired Eating/Feeding: Independent   Grooming: Wash/dry hands;Wash/dry face;Oral care;Brushing hair;Set up;Bed level   Upper Body Bathing: Minimal assistance;Bed level Upper Body Bathing Details (indicate cue type and reason): assist due to pain  Lower Body Bathing: Maximal assistance;Bed level   Upper Body Dressing : Maximal assistance;Bed level   Lower Body Dressing: Total assistance;Bed level   Toilet Transfer: Total assistance Toilet Transfer Details (indicate cue type and reason): does not perform at home.  performs bowel program supine  Toileting- Clothing Manipulation and Hygiene: Total assistance;Bed level       Functional mobility during ADLs: Maximal assistance;+2 for physical assistance General ADL Comments: Pt limited by Pain Lt breast area      Vision Baseline Vision/History: Wears glasses Wears Glasses: At all times Patient Visual Report: No change from baseline Vision Assessment?: No apparent visual deficits     Perception     Praxis      Pertinent Vitals/Pain Pain Assessment: Faces Faces Pain Scale: Hurts even more Pain Location: Lt breast  Pain Descriptors / Indicators: Grimacing;Guarding;Throbbing     Hand Dominance Right   Extremity/Trunk Assessment Upper Extremity Assessment Upper Extremity Assessment: Generalized weakness;LUE deficits/detail LUE Deficits / Details: Pt moves cautiously with Lt UE due to pain    Lower Extremity Assessment Lower Extremity Assessment: Defer to PT  evaluation(h/o paraplegia )       Communication Communication Communication: No difficulties   Cognition Arousal/Alertness: Awake/alert Behavior During Therapy: WFL for tasks assessed/performed Overall Cognitive Status: Within Functional Limits for tasks assessed                                     General Comments  OT eval limited by pain     Exercises     Shoulder Instructions      Home Living Family/patient expects to be discharged to:: Private residence Living Arrangements: Other relatives Available Help at Discharge: Available PRN/intermittently Type of Home: House Home Access: Ramped entrance     Home Layout: One level     Bathroom Shower/Tub: Teacher, early years/pre: Handicapped height Bathroom Accessibility: No   Home Equipment: Environmental consultant - 2 wheels;Bedside commode;Shower seat;Wheelchair - power;Wheelchair - manual;Hospital bed;Other (comment)   Additional Comments: Bathroom is not accessible.  Pt sponge bathes and performs bowel program supine.  uses sliding board for transfers to power wheelchair      Prior Functioning/Environment Level of Independence: Needs assistance  Gait / Transfers Assistance Needed: nonambulatory, uses sliding board with 1 person assist for bed<>w/c transfers ADL's / Homemaking Assistance Needed: Requires "a lot" of help from family    Comments: Pt reports using RW for short distances with therapy only until May 2019        OT Problem List: Decreased strength;Decreased activity tolerance;Impaired balance (sitting and/or standing);Pain      OT Treatment/Interventions: Self-care/ADL training;Therapeutic exercise;DME and/or AE instruction;Therapeutic activities;Patient/family education;Balance training    OT Goals(Current goals can be found in the care plan section) Acute Rehab OT Goals Patient Stated Goal: to get wounds resolved, decrease pain in chest and return home  OT Goal Formulation: With  patient Time For Goal Achievement: 08/24/18 Potential to Achieve Goals: Good ADL Goals Pt Will Perform Upper Body Bathing: with set-up;bed level;sitting Pt Will Perform Lower Body Bathing: with mod assist;bed level Pt Will Perform Upper Body Dressing: with min assist;sitting  OT Frequency: Min 2X/week   Barriers to D/C:            Co-evaluation              AM-PAC OT "6 Clicks" Daily Activity     Outcome Measure Help from another person eating meals?: None Help from another person taking care of personal grooming?: None Help from another person toileting, which includes using toliet, bedpan, or urinal?: Total Help from another person bathing (including washing, rinsing, drying)?: A Lot Help from another person to put on and taking off regular upper body clothing?: A Lot Help from another person to put on and taking off regular lower body clothing?: Total 6 Click Score: 14   End of Session Nurse Communication: Mobility status  Activity Tolerance: Patient limited by pain Patient left: in bed;with call bell/phone within reach  OT Visit Diagnosis: Pain Pain - Right/Left: Left Pain - part of body: (breast area )                Time: 6301-6010 OT Time Calculation (min): 12 min Charges:  OT General Charges $OT Visit: 1 Visit OT Evaluation $OT Eval Moderate Complexity: 1 Mod  Lucille Passy, OTR/L Acute Rehabilitation Services Pager [phone] Office [place], [place] 08/10/2018, 3:52 PM

## 2018-08-10 NOTE — H&P (Addendum)
[facility] Admission History and Physical Service Pager: [phone]  Patient name: Sarah [name] Medical record number: [id number] Date of birth: December 02, 1965 Age: 53 y.o. Gender: female  Primary Care Provider: [name], MD Consultants: none Code Status: full  Chief Complaint: worsening sacral ulcers  Assessment and Plan: Sarah [name] is a 53 y.o. female presenting with worsening of bilateral sacral ulcers and fever concerning for bacterial wound infection . PMH is significant for paraplegia, metastatic cancer,  GERD, depression,   Fever 2/2 sacral wound infection - patient is paraplegic below the T4 level d/t metastatic disease involving the spine.  She has developed bilateral sacral wounds, but has not been receiving wound care for them at home. She has been putting nystatin powder on them without any dressing.  She came to the ED d/t worsening of the ulcers and was found to have a fever of 102.5.  She was also briefly tachycardic at the time which appears to be outlier so did not meet sepsis criteria, but BP was normal and there is no leukocytosis.  Pulse has since normalized. Lactic acid < 2.  On exam, the wound was packed so it could not be staged, but it appeared to be at least 4-5 cm wide on the left buttock with some surrounding erythema and emitting a foul odor.  Could not visualize the right buttock as there was stool in the way. CT is pending for concern of osteomyelitis. The patient also has been having complaints of vaginal pain/itching, so it is possible she has a UTI.  Otherwise there no indications of infection elsewhere. We will give vanc/zosyn while we await cultures and if there is concern for osteo in the am, can consider ortho consult.   - admit to obs, med-tele, Dr. Ardelia Mems attending.  - continue vanc per pharm, and zosyn 4.5g q6h to cover for pseudomonas.   - f/u CT scan of pelvis no evidence of osteomyelitis but notable for R inferior  pubic ramus gas - consult general surgery  - wound care consult - f/u blood and urine cultures - f/u urinalysis positive for leukocytes and bacteria but neg for nitrites  - AM labs: cbc, bmp,  - continuous cardiac monitoring - vitals per routine.  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  Recent craniotomy in march.  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  She is incontinent of bowel and bladder. She takes baclofen for muscle spasms and synthroid for postop hypothyroidism. Chronic sacral wounds as described above.  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  No current increased risk of bleeding.   - continue xarelto  Vaginal candidiasis - multiple recent office visits for vaginal candidiasis and candidal intertrigo.  She has been using nystatin powder. On external vaginal exam, did not appreciate any erythema or signs of candidiasis.   - holding nystatin powder.   GERD - takes pantoprazole 40mg  at home.   - continue home meds  Depression - takes zoloft 50mg  at home.  - continue home meds  Hypothyroidism - 2/2 thyroid cancer and thyroidectomy.  Takes 127mcg daily at home - continue home meds.   Seasonal allergies - patient takes flonase and zyrtec at home.  - continue flonase - claritin  on forumulary  FEN/GI: heart healthy diet, pantoprazole Prophylaxis: on xarelto.    Disposition: med-tele  History of Present Illness:  Sarah [name] is a 53 y.o. female presenting with worsening sacral wound and fever.   The patient states she came to the ED because her mom was concerned about her worsening sacral ulcers. She did not have home health for her wound care, nor was she applying any dressings on it herself.  She and her mother were applying nystatin powder to it to help  keep it from getting infected.  She is incontinent of bowel and bladder and changes her diaper about 4 times a day.  She states she does not feel any worse than usual lately and that she has just felt 'crummy' the past several months since her cholecystectomy. She denies fever, chills, cough, n/v.  She lives with her sister and brother in [place].   Review Of Systems: Per HPI with the following additions:   Review of Systems  Constitutional: Negative for chills and fever.  HENT: Negative for sore throat.   Respiratory: Negative for cough and shortness of breath.   Cardiovascular: Negative for chest pain.  Gastrointestinal: Negative for abdominal pain, nausea and vomiting.  Genitourinary: Negative for dysuria.  Skin: Positive for rash.  Neurological: Negative for dizziness and headaches.    Patient Active Problem List   Diagnosis Date Noted  . Bacterial skin infection 08/10/2018  . Eustachian tube disorder, left 06/30/2018  . Urinary tract infection 06/30/2018  . Malignant neoplasm ([place]) 06/01/2018  . Metastasis to brain ([place]) 06/01/2018  . Transaminitis 04/09/2018  . Hypoalbuminemia 04/09/2018  . Dyspnea   . Multifocal pneumonia   . Palliative care by specialist   . Goals of care, counseling/discussion   . DNR (do not resuscitate) discussion   . Fecal impaction ([place]) 02/06/2018  . HCAP (healthcare-associated pneumonia) 02/06/2018  . Anemia   . Chronic anticoagulation   . Pressure injury of skin 02/04/2018  . Fracture   . Acute lower UTI   . Myelopathy ([place]) 09/21/2017  . Muscle spasms of both lower extremities   . Closed fracture of shaft of left femur ([place])   . Acute blood loss anemia   . Hypothyroidism   . Nausea & vomiting 09/15/2017  . Closed fracture of left distal femur ([place]) 09/11/2017  . Paraplegia ([place]) 09/08/2017  . Obesity (BMI 30-39.9) 09/03/2017  . Status post surgery 09/03/2017  . Calculus of gallbladder without cholecystitis without obstruction   . Biliary  dyskinesia   . Intractable nausea and vomiting 08/09/2017  . Thrombocytopenia ([place]) 08/09/2017  . Elevated bilirubin 08/09/2017  . AKI (acute kidney injury) ([place]) 08/09/2017  . Chest pain 01/26/2017  . Upper respiratory infection, viral 01/26/2017  . Counseling regarding advanced care planning and goals of care 01/02/2017  . Bilateral rotator cuff syndrome 02/12/2016  . E. coli UTI 08/01/2015  . Constipation due to neurogenic bowel 08/01/2015  . 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 Spastic paraparesis   . Neurogenic bladder   . History of pulmonary embolism   . Post-operative pain   . Metastatic cancer ([place])   . Steroid-induced hyperglycemia   . Depression   . Obstipation   . Thoracic spine tumor 07/06/2015  . 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 ([place]) 07/06/2015  . Spinal cord compression due to malignant neoplasm metastatic to spine ([place])   . Postoperative hypothyroidism   . Secondary malignant neoplasm of vertebral column ([place]) 03/08/2015  . Spasticity 09/19/2014  . Hypertension  05/01/2014  . Ingrown right big toenail 05/01/2014  . GERD (gastroesophageal reflux disease) 02/14/2014  . Dysphagia, pharyngoesophageal phase 02/06/2014  . Type 2 diabetes mellitus without complication ([place]) 43/32/9518  . Constipation   . Neurogenic bowel   . Paraplegia at T4 level ([place]) 01/20/2014  . Metastatic cancer to spine ([place]) 01/19/2014  . Rotator cuff tendonitis   . 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 Hurthle cell carcinoma of thyroid ([place]) 12/29/2013  . Leg weakness, bilateral 12/13/2013    Past Medical History: Past Medical History:  Diagnosis Date  . Anxiety   . Cancer [facility])    FNA of thyroid positive for onconytic/hurthle cell carcinoma  . Chronic back pain   . Complication of anesthesia    Nausea and vomiting  . DDD (degenerative disc disease), cervical   . Depression   . DJD (degenerative joint disease)   . Family history of adverse reaction to anesthesia     MOTHER HAS NAUSEA   . GERD (gastroesophageal reflux disease)   . History of blood transfusion    08/2017  . History of rectal fissure   . HIT (heparin-induced thrombocytopenia) ([place])   . Hypertension    05/27/2018- not current- runs low  . Hypothyroidism   . Obesity   . Paraplegia at T4 level ([place])   . Pneumonia 03/2018  . Pulmonary embolus, right ([place]) 2015  . Rotator cuff tendonitis right    Past Surgical History: Past Surgical History:  Procedure Laterality Date  . APPLICATION OF CRANIAL NAVIGATION N/A 06/01/2018   Procedure: APPLICATION OF CRANIAL NAVIGATION;  Surgeon: Erline Levine, MD;  Location: [place];  Service: Neurosurgery;  Laterality: N/A;  . CHOLECYSTECTOMY N/A 01/25/2018   Procedure: LAPAROSCOPIC CHOLECYSTECTOMY;  Surgeon: Virl Cagey, MD;  Location: [facility];  Service: General;  Laterality: N/A;  . CRANIOTOMY N/A 06/01/2018   Procedure: Craniectomy for skull metastasis with Brainlab;  Surgeon: Erline Levine, MD;  Location: [place];  Service: Neurosurgery;  Laterality: N/A;  . FEMUR IM NAIL Left 09/11/2017   Procedure: INTRAMEDULLARY (IM) RETROGRADE FEMORAL NAILING;  Surgeon: Altamese Moffat, MD;  Location: [place];  Service: Orthopedics;  Laterality: Left;  . LAMINECTOMY N/A 12/14/2013   Procedure: THORACIC LAMINECTOMY FOR TUMOR THORACIC THREE;  Surgeon: Ashok Pall, MD;  Location: [place] [facility];  Service: Neurosurgery;  Laterality: N/A;  THORACIC LAMINECTOMY FOR TUMOR THORACIC THREE  . LAMINECTOMY N/A 07/05/2015   Procedure: THORACIC LAMINECTOMY FOR TUMOR;  Surgeon: Ashok Pall, MD;  Location: [place] [facility];  Service: Neurosurgery;  Laterality: N/A;  . LAMINECTOMY N/A 09/03/2017   Procedure: POSTERIOR SPINAL TUMOR RESECTION THORACIC THREE;  Surgeon: Ashok Pall, MD;  Location: [place];  Service: Neurosurgery;  Laterality: N/A;  POSTERIOR SPINAL TUMOR RESECTION THORACIC THREE  . POSTERIOR LUMBAR FUSION 4 LEVEL N/A 12/30/2013   Procedure: Thoracic one-Thoracic five posterior  thoracic fusion with pedicle screws;  Surgeon: Ashok Pall, MD;  Location: [place] [facility];  Service: Neurosurgery;  Laterality: N/A;  Thoracic one-Thoracic five posterior thoracic fusion with pedicle screws  . THYROIDECTOMY N/A 01/09/2014   Procedure: TOTAL THYROIDECTOMY;  Surgeon: Izora Gala, MD;  Location: [place];  Service: ENT;  Laterality: N/A;  . TONSILLECTOMY      Social History: Social History   Tobacco Use  . Smoking status: Never Smoker  . Smokeless tobacco: Never Used  Substance Use Topics  . Alcohol use: No  . Drug use: No   Additional social history:   Please also refer to relevant  sections of EMR.  Family History: Family History  Problem Relation Age of Onset  . Hypertension Mother   . Diabetes Mother   . Hypertension Father   . Stroke Father   . Diabetes Father   . Diabetes Sister   . Diabetes Sister     Allergies and Medications: Allergies  Allergen Reactions  . Bee Venom Anaphylaxis  . Heparin Other (See Comments)    Weak positive platelets induced antibodies, SRA negative--2015  . Keflex [Cephalexin] Nausea And Vomiting  . Tramadol Nausea And Vomiting  . Gabapentin Nausea And Vomiting  . Hydrochlorothiazide Nausea And Vomiting  . Hydrocodone Nausea And Vomiting  . Oxycodone-Acetaminophen Nausea And Vomiting  . Sorbitol Nausea Only    And abdominal distress  . Tizanidine     Fatigue    No current facility-administered medications on file prior to encounter.    Current Outpatient Medications on File Prior to Encounter  Medication Sig Dispense Refill  . acetaminophen (TYLENOL) 325 MG tablet Take 2 tablets (650 mg total) by mouth every 4 (four) hours as needed for mild pain ((score 1 to 3) or temp > 100.5).    [name] Kitchen albuterol (PROVENTIL) (2.5 MG/3ML) 0.083% nebulizer solution Take 3 mLs (2.5 mg total) by nebulization every 6 (six) hours as needed for wheezing or shortness of breath. 75 mL 12  . baclofen (LIORESAL) 20 MG tablet Take 1 tablet (20 mg total)  by mouth 2 (two) times daily. 60 tablet 4  . cetirizine (ZYRTEC) 10 MG tablet Take 1 tablet (10 mg total) by mouth daily. 30 tablet 11  . fluticasone (FLONASE) 50 MCG/ACT nasal spray Place 2 sprays into both nostrils daily. 16 g 6  . HYDROcodone-acetaminophen (NORCO/VICODIN) 5-325 MG tablet Take 1 tablet by mouth every 8 (eight) hours as needed for up to 10 days for moderate pain. 30 tablet 0  . ipratropium-albuterol (DUONEB) 0.5-2.5 (3) MG/3ML SOLN Take 3 mLs by nebulization 3 (three) times daily. 360 mL   . levothyroxine (SYNTHROID) 150 MCG tablet Take 1 tablet (150 mcg total) by mouth daily before breakfast. 30 tablet 1  . Multiple Vitamin (MULTIVITAMIN WITH MINERALS) TABS tablet Take 1 tablet by mouth daily.    [name] Kitchen nystatin (MYCOSTATIN/NYSTOP) powder Apply topically 4 (four) times daily. To groin. 60 g 3  . ondansetron (ZOFRAN) 4 MG tablet Take 1 tablet by mouth every 6 (six) hours as needed for nausea or vomiting.     [name] Kitchen oxymetazoline (AFRIN NASAL SPRAY) 0.05 % nasal spray Place 1 spray into both nostrils 2 (two) times daily. 30 mL 0  . pantoprazole (PROTONIX) 40 MG tablet Take 1 tablet (40 mg total) by mouth daily. (Patient taking differently: Take 40 mg by mouth daily. ) 30 tablet 0  . polyethylene glycol (MIRALAX / GLYCOLAX) packet Take 17 g by mouth daily. 14 each 0  . rivaroxaban (XARELTO) 20 MG TABS tablet Take 1 tablet (20 mg total) by mouth daily with supper. 30 tablet 3  . senna (SENOKOT) 8.6 MG TABS tablet Take 1 tablet (8.6 mg total) by mouth at bedtime as needed for mild constipation. 120 each 0  . sertraline (ZOLOFT) 50 MG tablet Take 50 mg by mouth daily.     . fluconazole (DIFLUCAN) 100 MG tablet Take 1 tablet (100 mg total) by mouth daily. 14 tablet 0    Objective: BP 93/61   Pulse 91   Temp 99.4 F (37.4 C) (Oral)   Resp 19   Ht 5\' 10"  (1.778 m)  Wt 102.1 kg   LMP 10/22/2016 (LMP Unknown)   SpO2 99%   BMI 32.28 kg/m  Exam: General: alert and oriented. No acute  distress. Laying in bed.  Eyes: PERRLA.  EOMI.  ENTM: moist oral mucosa.  No oropharyngeal erythema.  Neck: no cervical LAD.   Cardiovascular: regular rhythm. Normal rate. No murmurs.  Respiratory: LCTAB anteriorly. No wheezes. No cough.  Gastrointestinal: soft, nontender.  Large pannus.   MSK: 0/5 lower extremity strength.  Equal strength bilaterally upper extremity.   GU: examined the groin anteriorly, no erythema noted. No vaginal odor.  Skin: left buttock ulcer, packed with gauze appears to be full thickness. Could not visualize the right buttock wound Approximately 4-5 cm in diameter and some surrounding erythema. Emitting foul odor.  Neuro: CN 2-12 grossly intact.  No sensation below the level of the nipples.  Psych: high pitched voice. Appears anxious.    Labs and Imaging: CBC BMET  Recent Labs  Lab 08/09/18 2129  WBC 6.3  HGB 9.1*  HCT 28.9*  PLT 266   Recent Labs  Lab 08/09/18 2129  NA 135  K 4.1  CL 98  CO2 25  BUN 17  CREATININE 1.15*  GLUCOSE 145*  CALCIUM 8.3*     Urinalysis    Component Value Date/Time   COLORURINE AMBER (A) 08/10/2018 0145   APPEARANCEUR CLOUDY (A) 08/10/2018 0145   LABSPEC 1.017 08/10/2018 0145   PHURINE 6.0 08/10/2018 0145   GLUCOSEU NEGATIVE 08/10/2018 0145   HGBUR SMALL (A) 08/10/2018 0145   BILIRUBINUR NEGATIVE 08/10/2018 0145   BILIRUBINUR MODERATE 03/03/2014 1028   KETONESUR NEGATIVE 08/10/2018 0145   PROTEINUR 30 (A) 08/10/2018 0145   UROBILINOGEN 1.0 03/11/2014 2130   NITRITE NEGATIVE 08/10/2018 0145   LEUKOCYTESUR MODERATE (A) 08/10/2018 0145      Dg Chest 2 View  Result Date: 08/09/2018 CLINICAL DATA:  53 year old female with suspected sepsis. Metastatic Hurthle cell thyroid carcinoma. EXAM: CHEST - 2 VIEW COMPARISON:  PET-CT 05/13/2018 and earlier. FINDINGS: AP and lateral views of the chest. Mildly lower lung volumes compared to 2018. Stable cardiac size and mediastinal contours. No pneumothorax, pulmonary edema,  pleural effusion or confluent pulmonary opacity. Left greater than right pleural effusions have resolved since February. Visualized tracheal air column is within normal limits. Chronic cervicothoracic/upper thoracic spinal hardware. Stable visualized osseous structures. Negative visible bowel gas pattern. IMPRESSION: No acute cardiopulmonary abnormality. Electronically Signed   By: Genevie Ann M.D.   On: 08/09/2018 22:04   Ct Pelvis W Contrast  Result Date: 08/10/2018 CLINICAL DATA:  53 year old female with suspected sepsis. Metastatic Hurthle cell thyroid carcinoma. Decubitus ulcers, query osteomyelitis. EXAM: CT PELVIS WITH CONTRAST TECHNIQUE: Multidetector CT imaging of the pelvis was performed using the standard protocol following the bolus administration of intravenous contrast. CONTRAST:  158mL OMNIPAQUE IOHEXOL 300 MG/ML  SOLN COMPARISON:  PET-CT 05/13/2018. CT abdomen, and Pelvis 03/03/2019. FINDINGS: Urinary Tract: Foley catheter in place. Urinary bladder decompressed. Visible ureters appear normal. Bowel: Retained stool in the distal large bowel including a 7 centimeter stool ball in the rectum. Mild presacral stranding. No pelvic free fluid. Visible large bowel loops are nondilated. Negative appendix. Vascular/Lymphatic: The visible Major arterial structures and central pelvic veins appear to be enhancing and patent. No lymphadenopathy. Reproductive:  Negative. Other:  No pelvic free fluid. Musculoskeletal: Right inferior pubic ramus region gas containing skin wound on series 3, image 53 is new since the February PET-CT. No organized or drainable fluid collection is associated. The  wound extends to near the periosteum of the inferior pubic ramus but there are no CT changes of osteomyelitis identified. Stable visualized osseous structures. Previous left femur ORIF. There is scattered additional patchy subcutaneous stranding including overlying the lower lumbar spine on series 3, image 1. But no other  confluent decubitus wound. IMPRESSION: 1. Right inferior pubic ramus region gas containing decubitus wound is new since the February PET-CT and is in proximity to the right inferior pubic ramus but there is no CT evidence of osteomyelitis. 2. Lesser areas of decubitus soft tissue inflammation elsewhere. 3. Stool ball in the rectum, consider fecal impaction. Electronically Signed   By: Genevie Ann M.D.   On: 08/10/2018 02:34     [name], MD 08/10/2018, 1:45 AM PGY-1, [place] Intern pager: [phone], text pages welcome  FPTS Upper-Level Resident Addendum  I have independently interviewed and examined the patient. I have discussed the above with the original author and agree with their documentation. My edits for correction/addition/clarification are in blue. Please see also any attending notes.   Bufford Lope, DO PGY-3, [place] [facility] 08/10/2018 6:24 AM  FPTS Service pager: [phone] (text pages welcome through [facility])

## 2018-08-10 NOTE — Progress Notes (Addendum)
FPTS Interim Progress Note  S:Reports that she is feeling well this AM.  Did have BM.  O: BP (!) 90/55 (BP Location: Right Arm)   Pulse 61   Temp 97.6 F (36.4 C) (Oral)   Resp 17   Ht 5\' 11"  (1.803 m)   Wt 100.4 kg   LMP 10/22/2016 (LMP Unknown)   SpO2 100%   BMI 30.87 kg/m    Physical Exam:  General: 53 y.o. female in NAD Cardio: RRR no m/r/g Lungs: CTAB, no wheezing, no rhonchi, no crackles, no IWOB on  RA Abdomen: Soft, non-tender to palpation, non-distended, positive bowel sounds Skin: warm and dry,sacral wound with dressing over, attempted to examine, but patient stooled and no nurse available at that time for assistance Extremities: No edema, paraplegic from T4 and below   A/P:  Sacral Wound Febrile to 102.5 in ED and has been afebrile since that time, WBC increased from 6.3>16.8.  On Vanc and Zosyn. - surg consulted - cont antibiotics  Hypokalemia K 3.3 - Kdur 40 x1 - BMP in AM  Normocytic Anemia  Hgb 7.0, repeated H&H, 7.5, appears to be a chronic issue on chart review.  Hx low iron and low TIBC.  Transfusion threshold 7.  Has had previous transfusions and GI evaluation that was negative.  Patient on Xarelto chronically. - monitor CBC  Constipation Large stool ball on CT abd/pelvis.  Patient states that she takes miralax and snna at home and usually has enemas while inpatient. - tap water enema   Cleophas Dunker, DO 08/10/2018, 8:13 AM PGY-1, [place] Medicine Service pager [phone]

## 2018-08-11 ENCOUNTER — Other Ambulatory Visit: Payer: Medicare Other

## 2018-08-11 ENCOUNTER — Encounter: Payer: Medicare Other | Admitting: Physical Medicine & Rehabilitation

## 2018-08-11 DIAGNOSIS — L089 Local infection of the skin and subcutaneous tissue, unspecified: Secondary | ICD-10-CM

## 2018-08-11 DIAGNOSIS — R5381 Other malaise: Secondary | ICD-10-CM

## 2018-08-11 DIAGNOSIS — G8220 Paraplegia, unspecified: Secondary | ICD-10-CM

## 2018-08-11 DIAGNOSIS — B9689 Other specified bacterial agents as the cause of diseases classified elsewhere: Secondary | ICD-10-CM

## 2018-08-11 DIAGNOSIS — R509 Fever, unspecified: Secondary | ICD-10-CM

## 2018-08-11 LAB — CBC WITH DIFFERENTIAL/PLATELET
Abs Immature Granulocytes: 0.05 10*3/uL (ref 0.00–0.07)
Basophils Absolute: 0 10*3/uL (ref 0.0–0.1)
Basophils Relative: 0 %
Eosinophils Absolute: 0.2 10*3/uL (ref 0.0–0.5)
Eosinophils Relative: 2 %
HCT: 20.1 % — ABNORMAL LOW (ref 36.0–46.0)
Hemoglobin: 6.3 g/dL — CL (ref 12.0–15.0)
Immature Granulocytes: 1 %
Lymphocytes Relative: 8 %
Lymphs Abs: 0.6 10*3/uL — ABNORMAL LOW (ref 0.7–4.0)
MCH: 29.2 pg (ref 26.0–34.0)
MCHC: 31.3 g/dL (ref 30.0–36.0)
MCV: 93.1 fL (ref 80.0–100.0)
Monocytes Absolute: 0.4 10*3/uL (ref 0.1–1.0)
Monocytes Relative: 4 %
Neutro Abs: 6.9 10*3/uL (ref 1.7–7.7)
Neutrophils Relative %: 85 %
Platelets: 198 10*3/uL (ref 150–400)
RBC: 2.16 MIL/uL — ABNORMAL LOW (ref 3.87–5.11)
RDW: 13.6 % (ref 11.5–15.5)
WBC: 8.1 10*3/uL (ref 4.0–10.5)
nRBC: 0 % (ref 0.0–0.2)

## 2018-08-11 LAB — BASIC METABOLIC PANEL
Anion gap: 7 (ref 5–15)
BUN: 15 mg/dL (ref 6–20)
CO2: 26 mmol/L (ref 22–32)
Calcium: 7.5 mg/dL — ABNORMAL LOW (ref 8.9–10.3)
Chloride: 104 mmol/L (ref 98–111)
Creatinine, Ser: 1.11 mg/dL — ABNORMAL HIGH (ref 0.44–1.00)
GFR calc Af Amer: >60 mL/min (ref >60)
GFR calc non Af Amer: 57 mL/min — ABNORMAL LOW (ref >60)
Glucose, Bld: 77 mg/dL (ref 70–99)
Potassium: 4 mmol/L (ref 3.5–5.1)
Sodium: 137 mmol/L (ref 135–145)

## 2018-08-11 LAB — HEMOGLOBIN AND HEMATOCRIT, BLOOD
HCT: 19.8 % — ABNORMAL LOW (ref 36.0–46.0)
HCT: 25.2 % — ABNORMAL LOW (ref 36.0–46.0)
Hemoglobin: 6.1 g/dL — CL (ref 12.0–15.0)
Hemoglobin: 8.1 g/dL — ABNORMAL LOW (ref 12.0–15.0)

## 2018-08-11 LAB — PREPARE RBC (CROSSMATCH)

## 2018-08-11 MED ORDER — FERROUS SULFATE 325 (65 FE) MG PO TABS
325.0000 mg | ORAL_TABLET | ORAL | Status: DC
  Administered 2018-08-15 – 2018-08-17 (×2): 325 mg via ORAL
  Filled 2018-08-11 (×5): qty 1

## 2018-08-11 MED ORDER — POLYETHYLENE GLYCOL 3350 17 G PO PACK
17.0000 g | PACK | Freq: Every day | ORAL | Status: DC
  Administered 2018-08-11 – 2018-08-16 (×5): 17 g via ORAL
  Filled 2018-08-11 (×6): qty 1

## 2018-08-11 MED ORDER — JUVEN PO PACK
1.0000 | PACK | Freq: Two times a day (BID) | ORAL | Status: DC
  Administered 2018-08-12 – 2018-08-17 (×6): 1 via ORAL
  Filled 2018-08-11 (×13): qty 1

## 2018-08-11 MED ORDER — SENNA 8.6 MG PO TABS: 1.0000 | ORAL_TABLET | Freq: Every evening | ORAL | Status: DC | PRN

## 2018-08-11 MED ORDER — SODIUM CHLORIDE 0.9% IV SOLUTION
Freq: Once | INTRAVENOUS | Status: AC
  Administered 2018-08-11: 12:00:00 via INTRAVENOUS

## 2018-08-11 MED ORDER — ENSURE ENLIVE PO LIQD
237.0000 mL | Freq: Two times a day (BID) | ORAL | Status: DC
  Administered 2018-08-11 – 2018-08-17 (×7): 237 mL via ORAL

## 2018-08-11 MED ORDER — BACLOFEN 10 MG PO TABS
20.0000 mg | ORAL_TABLET | Freq: Three times a day (TID) | ORAL | Status: DC
  Administered 2018-08-11 – 2018-08-17 (×16): 20 mg via ORAL
  Filled 2018-08-11 (×17): qty 2

## 2018-08-11 NOTE — Consult Note (Signed)
   [facility] CM Inpatient Consult   08/11/2018  [name] 10/27/1965 [id number]    Patient screened for 47% extreme high risk score for unplanned readmission; has 3 hospitalizations and 2 ED visits in the past 6 months and for possible [place] Management services. Patient wasoutreached byTHN Telephonic care coordinator for [facility] follow-up calls [place] plan in the past.   Per history and physical dated 08/10/18 reveals as follows: [name] is a 53 y.o. female presenting with worsening of bilateral sacral ulcers and fever concerning for bacterial wound infection . PMH is significant for paraplegia from T4, spinal metastatic cancer, GERD.  PT/ OT note reviewed and recommending CIR level therapies [facility]).  Per chart review, reveals patient is being considered for CIR.  Will followfor progress anddisposition.Ifthere are any disposition changes,and needs for appropriate community follow-up,please referto [facility] care management.  Of note, [facility] Care Management services does not replace or interfere with any services that are arranged by transition of care case management or social work.   For questions and additional information, please call:  Colbe Viviano A. Jahon Bart, BSN, RN-BC [facility] Liaison Cell: [phone]

## 2018-08-11 NOTE — Progress Notes (Signed)
Patient ID: Sarah [name], female   DOB: 1965/06/14, 53 y.o.   MRN: [id number]       Subjective: No new complaints.  PT hydro here  Objective: Vital signs in last 24 hours: Temp:  [97.7 F (36.5 C)-98.7 F (37.1 C)] 98.7 F (37.1 C) (05/20 0342) Pulse Rate:  [61-70] 70 (05/20 0342) Resp:  [16-18] 16 (05/20 0342) BP: (84-90)/(52-57) 90/57 (05/20 0342) SpO2:  [97 %-100 %] 97 % (05/20 0342) Weight:  [101.6 kg] 101.6 kg (05/19 2214) Last BM Date: 08/10/18  Intake/Output from previous day: 05/19 0701 - 05/20 0700 In: 758.9 [P.O.:240; I.V.:168.9; IV Piggyback:350] Out: 1 [Stool:1] Intake/Output this shift: No intake/output data recorded.  PE: Skin: bilateral wounds are actually slightly improved already from yesterday, but still overall mostly necrotic tissue.  Getting ready to start hydro. right wound left wound      Lab Results:  Recent Labs    08/10/18 0414  08/11/18 0317 08/11/18 0752  WBC 16.8*  --  8.1  --   HGB 7.0*   < > 6.3* 6.1*  HCT 21.9*   < > 20.1* 19.8*  PLT 227  --  198  --    < > = values in this interval not displayed.   BMET Recent Labs    08/10/18 0414 08/11/18 0317  NA 133* 137  K 3.2* 4.0  CL 100 104  CO2 24 26  GLUCOSE 114* 77  BUN 16 15  CREATININE 1.23* 1.11*  CALCIUM 7.4* 7.5*   PT/INR Recent Labs    08/09/18 2129  LABPROT 23.3*  INR 2.1*   CMP     Component Value Date/Time   NA 137 08/11/2018 0317   NA 139 04/08/2018 1503   NA 143 01/02/2017 1002   K 4.0 08/11/2018 0317   K 4.0 01/02/2017 1002   CL 104 08/11/2018 0317   CO2 26 08/11/2018 0317   CO2 25 01/02/2017 1002   GLUCOSE 77 08/11/2018 0317   GLUCOSE 87 01/02/2017 1002   BUN 15 08/11/2018 0317   BUN 13 04/08/2018 1503   BUN 11.9 01/02/2017 1002   CREATININE 1.11 (H) 08/11/2018 0317   CREATININE 1.09 (H) 07/21/2018 1004   CREATININE 0.9 01/02/2017 1002   CALCIUM 7.5 (L) 08/11/2018 0317   CALCIUM 7.2 (L) 09/15/2017 0635   CALCIUM 8.8 01/02/2017 1002   PROT 6.8 08/09/2018 2129   PROT 5.8 (L) 04/08/2018 1503   PROT 6.3 (L) 01/02/2017 1002   ALBUMIN 2.6 (L) 08/09/2018 2129   ALBUMIN 2.7 (L) 04/08/2018 1503   ALBUMIN 3.2 (L) 01/02/2017 1002   AST 29 08/09/2018 2129   AST 9 (L) 07/21/2018 1004   AST 24 01/02/2017 1002   ALT 24 08/09/2018 2129   ALT <6 07/21/2018 1004   ALT 12 01/02/2017 1002   ALKPHOS 387 (H) 08/09/2018 2129   ALKPHOS 126 01/02/2017 1002   BILITOT 1.0 08/09/2018 2129   BILITOT 0.5 07/21/2018 1004   BILITOT 1.06 01/02/2017 1002   GFRNONAA 57 (L) 08/11/2018 0317   GFRNONAA 58 (L) 07/21/2018 1004   GFRNONAA 71 06/07/2015 1523   GFRAA >60 08/11/2018 0317   GFRAA >60 07/21/2018 1004   GFRAA 82 06/07/2015 1523   Lipase     Component Value Date/Time   LIPASE 20 03/02/2018 1448       Studies/Results: Dg Chest 2 View  Result Date: 08/09/2018 CLINICAL DATA:  53 year old female with suspected sepsis. Metastatic Hurthle cell thyroid carcinoma. EXAM: CHEST - 2 VIEW COMPARISON:  PET-CT 05/13/2018 and earlier. FINDINGS: AP and lateral views of the chest. Mildly lower lung volumes compared to 2018. Stable cardiac size and mediastinal contours. No pneumothorax, pulmonary edema, pleural effusion or confluent pulmonary opacity. Left greater than right pleural effusions have resolved since February. Visualized tracheal air column is within normal limits. Chronic cervicothoracic/upper thoracic spinal hardware. Stable visualized osseous structures. Negative visible bowel gas pattern. IMPRESSION: No acute cardiopulmonary abnormality. Electronically Signed   By: Genevie Ann M.D.   On: 08/09/2018 22:04   Ct Pelvis W Contrast  Result Date: 08/10/2018 CLINICAL DATA:  53 year old female with suspected sepsis. Metastatic Hurthle cell thyroid carcinoma. Decubitus ulcers, query osteomyelitis. EXAM: CT PELVIS WITH CONTRAST TECHNIQUE: Multidetector CT imaging of the pelvis was performed using the standard protocol following the bolus administration  of intravenous contrast. CONTRAST:  12mL OMNIPAQUE IOHEXOL 300 MG/ML  SOLN COMPARISON:  PET-CT 05/13/2018. CT abdomen, and Pelvis 03/03/2019. FINDINGS: Urinary Tract: Foley catheter in place. Urinary bladder decompressed. Visible ureters appear normal. Bowel: Retained stool in the distal large bowel including a 7 centimeter stool ball in the rectum. Mild presacral stranding. No pelvic free fluid. Visible large bowel loops are nondilated. Negative appendix. Vascular/Lymphatic: The visible Major arterial structures and central pelvic veins appear to be enhancing and patent. No lymphadenopathy. Reproductive:  Negative. Other:  No pelvic free fluid. Musculoskeletal: Right inferior pubic ramus region gas containing skin wound on series 3, image 53 is new since the February PET-CT. No organized or drainable fluid collection is associated. The wound extends to near the periosteum of the inferior pubic ramus but there are no CT changes of osteomyelitis identified. Stable visualized osseous structures. Previous left femur ORIF. There is scattered additional patchy subcutaneous stranding including overlying the lower lumbar spine on series 3, image 1. But no other confluent decubitus wound. IMPRESSION: 1. Right inferior pubic ramus region gas containing decubitus wound is new since the February PET-CT and is in proximity to the right inferior pubic ramus but there is no CT evidence of osteomyelitis. 2. Lesser areas of decubitus soft tissue inflammation elsewhere. 3. Stool ball in the rectum, consider fecal impaction. Electronically Signed   By: Genevie Ann M.D.   On: 08/10/2018 02:34    Anti-infectives: Anti-infectives (From admission, onward)   Start     Dose/Rate Route Frequency Ordered Stop   08/11/18 0200  vancomycin (VANCOCIN) 1,250 mg in sodium chloride 0.9 % 250 mL IVPB     1,250 mg 166.7 mL/hr over 90 Minutes Intravenous Every 24 hours 08/10/18 0148     08/10/18 0800  piperacillin-tazobactam (ZOSYN) IVPB 3.375 g      3.375 g 12.5 mL/hr over 240 Minutes Intravenous Every 8 hours 08/10/18 0315     08/10/18 0200  vancomycin (VANCOCIN) 2,000 mg in sodium chloride 0.9 % 500 mL IVPB     2,000 mg 250 mL/hr over 120 Minutes Intravenous  Once 08/10/18 0147 08/10/18 0523   08/10/18 0030  vancomycin (VANCOCIN) IVPB 1000 mg/200 mL premix  Status:  Discontinued     1,000 mg 200 mL/hr over 60 Minutes Intravenous  Once 08/10/18 0018 08/10/18 0147   08/10/18 0030  piperacillin-tazobactam (ZOSYN) IVPB 3.375 g     3.375 g 100 mL/hr over 30 Minutes Intravenous  Once 08/10/18 0018 08/10/18 0149       Assessment/Plan 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  May need more bedside debridement, but if improves with above  measures then may not.  Doubt will need OR.  FEN - regular VTE - xarelto on hold ID - Vanc/Zosyn   LOS: 1 day    Henreitta Cea , [facility] 08/11/2018, 9:50 AM Pager: [phone]

## 2018-08-11 NOTE — Progress Notes (Signed)
Physical Therapy Wound Evaluation/Treatment Patient Details  Name: Sarah [name] MRN: [id number] Date of Birth: 1966/01/08  Today's Date: 08/11/2018 Time: 8811-0315 Time Calculation (min): 67 min  Subjective  Subjective: Pleasant and agreeable to hydrotherapy Patient and Family Stated Goals: Heal wounds Date of Onset: (Unknown) Prior Treatments: Bedside I&D  Pain Score: Pt did not require premedication and tolerated hydrotherapy treatment without complaints of pain.   Wound Assessment  Pressure Injury 08/10/18 Unstageable - Full thickness tissue loss in which the base of the ulcer is covered by slough (yellow, tan, gray, green or brown) and/or eschar (tan, brown or black) in the wound bed. (Active)  Wound Image   08/11/2018  1:36 PM  Dressing Type Moist to dry;ABD;Barrier Film (skin prep);Gauze (Comment) 08/11/2018  1:36 PM  Dressing Clean;Dry;Intact;Changed 08/11/2018  1:36 PM  Dressing Change Frequency Daily 08/11/2018  1:36 PM  State of Healing Eschar 08/11/2018  1:36 PM  Site / Wound Assessment Black;Yellow;Pink 08/11/2018  1:36 PM  % Wound base Red or Granulating 30% 08/11/2018  1:36 PM  % Wound base Yellow/Fibrinous Exudate 30% 08/11/2018  1:36 PM  % Wound base Black/Eschar 40% 08/11/2018  1:36 PM  Peri-wound Assessment Intact;Induration 08/11/2018  1:36 PM  Wound Length (cm) 3.7 cm 08/11/2018  1:36 PM  Wound Width (cm) 4.1 cm 08/11/2018  1:36 PM  Wound Depth (cm) 2.1 cm 08/11/2018  1:36 PM  Wound Surface Area (cm^2) 15.17 cm^2 08/11/2018  1:36 PM  Wound Volume (cm^3) 31.86 cm^3 08/11/2018  1:36 PM  Tunneling (cm) 0 08/11/2018  1:36 PM  Undermining (cm) 0 08/11/2018  1:36 PM  Margins Unattached edges (unapproximated) 08/11/2018  1:36 PM  Drainage Amount Moderate 08/11/2018  1:36 PM  Drainage Description Purulent 08/11/2018  1:36 PM  Treatment Debridement (Selective);Hydrotherapy (Pulse lavage);Packing (Saline gauze) 08/11/2018  1:36 PM   Santyl applied to wound bed prior to applying dressing.      Pressure Injury 08/10/18 Unstageable - Full thickness tissue loss in which the base of the ulcer is covered by slough (yellow, tan, gray, green or brown) and/or eschar (tan, brown or black) in the wound bed. (Active)  Wound Image   08/11/2018  1:36 PM  Dressing Type Moist to dry;ABD;Barrier Film (skin prep);Gauze (Comment) 08/11/2018  1:36 PM  Dressing Clean;Dry;Intact;Changed 08/11/2018  1:36 PM  Dressing Change Frequency Daily 08/11/2018  1:36 PM  State of Healing Eschar 08/11/2018  1:36 PM  Site / Wound Assessment Black;Yellow;Pink 08/11/2018  1:36 PM  % Wound base Red or Granulating 25% 08/11/2018  1:36 PM  % Wound base Yellow/Fibrinous Exudate 30% 08/11/2018  1:36 PM  % Wound base Black/Eschar 45% 08/11/2018  1:36 PM  Peri-wound Assessment Intact;Induration 08/11/2018  1:36 PM  Wound Length (cm) 8 cm 08/11/2018  1:36 PM  Wound Width (cm) 8.3 cm 08/11/2018  1:36 PM  Wound Depth (cm) 5 cm 08/11/2018  1:36 PM  Wound Surface Area (cm^2) 66.4 cm^2 08/11/2018  1:36 PM  Wound Volume (cm^3) 332 cm^3 08/11/2018  1:36 PM  Tunneling (cm) 0 08/11/2018  1:36 PM  Undermining (cm) 2.5 cm at 11:00 08/11/2018  1:36 PM  Margins Unattached edges (unapproximated) 08/11/2018  1:36 PM  Drainage Amount Moderate 08/11/2018  1:36 PM  Drainage Description Purulent 08/11/2018  1:36 PM  Treatment Debridement (Selective);Hydrotherapy (Pulse lavage);Packing (Saline gauze) 08/11/2018  1:36 PM   Santyl applied to wound bed prior to applying dressing.     Hydrotherapy Pulsed lavage therapy - wound location: Bilateral Ischial Tuberosities Pulsed Lavage with  Suction (psi): 12 psi Pulsed Lavage with Suction - Normal Saline Used: 2000 mL Pulsed Lavage Tip: Tip with splash shield Selective Debridement Selective Debridement - Location: Bilateral Ischial Tuberosities Selective Debridement - Tools Used: Forceps;Scissors Selective Debridement - Tissue Removed: Yellow and black unviable tissue   Wound Assessment and Plan  Wound  Therapy - Assess/Plan/Recommendations Wound Therapy - Clinical Statement: Pt presents to hydrotherapy s/p bedside debridement by surgical team. Pt with reported "no feeling" and did well without pain medication prior to session. Pt with spasms and occasional pain with positioning but tolerated hydro well. This patient will benefit from continued hydrotherapy for selective removal of necrotic tissue, to decrease bioburden and promote wound bed healing.  Wound Therapy - Functional Problem List: Paraplegia Factors Delaying/Impairing Wound Healing: Altered sensation;Incontinence;Immobility Hydrotherapy Plan: Debridement;Dressing change;Patient/family education;Pulsatile lavage with suction Wound Therapy - Frequency: 6X / week Wound Therapy - Follow Up Recommendations: Other (comment)(PT recommending CIR) Wound Plan: See above  Wound Therapy Goals- Improve the function of patient's integumentary system by progressing the wound(s) through the phases of wound healing (inflammation - proliferation - remodeling) by: Decrease Necrotic Tissue to: 25% Decrease Necrotic Tissue - Progress: Goal set today Increase Granulation Tissue to: 75% Increase Granulation Tissue - Progress: Goal set today Improve Drainage Characteristics: Min;Serous Improve Drainage Characteristics - Progress: Goal set today Goals/treatment plan/discharge plan were made with and agreed upon by patient/family: Yes Time For Goal Achievement: 7 days Wound Therapy - Potential for Goals: Good  Goals will be updated until maximal potential achieved or discharge criteria met.  Discharge criteria: when goals achieved, discharge from hospital, MD decision/surgical intervention, no progress towards goals, refusal/missing three consecutive treatments without notification or medical reason.  GP     [name] 08/11/2018, 1:54 PM   [name], PT, DPT Acute Rehabilitation Services Pager: [phone] Office: [phone]

## 2018-08-11 NOTE — Progress Notes (Signed)
Initial Nutrition Assessment  DOCUMENTATION CODES:   Not applicable  INTERVENTION:    Ensure Enlive po BID, each supplement provides 350 kcal and 20 grams of protein  Juven Fruit Punch BID, each serving provides 95kcal and 2.5g of protein (amino acids glutamine and arginine)  NUTRITION DIAGNOSIS:   Increased nutrient needs related to wound healing as evidenced by estimated needs  GOAL:   Patient will meet greater than or equal to 90% of their needs  MONITOR:   PO intake, Supplement acceptance, Labs, Weight trends, I & O's, Skin  REASON FOR ASSESSMENT:   Consult Wound healing  ASSESSMENT:   Patient with PMH significant for metastatic hurthle cell carcinoma of thyroid, thoracic spine tumor, paraplegia, DM, GERD, HTN, and constipation due to neurogenic bowel. Presents this admission with fever 2/2 to sacral wound infection.    5/19- I&D L/R decubitus ulcers   RD working remotely.  Pt starting hydrotherapy today.   Spoke with pt via phone. Endorses having a loss in appetite two weeks PTA due to feeling poorly. States during this time she consumed eggs with bacon for breakfast, tuna or chicken with a grain for lunch, and often went out for dinner (tried to get meat, vegetable, and grain). Reports the frequency she ate did not decrease but quantity of meals did (finshed around 50% each time). Pt consumed one Pure Protein daily as well. Meal completions charted as 50% for pt's last meal. Discussed the importance of protein intake to promote wound healing. Pt amendable to Juven. Will utilize Ensure for complete supplement as pt feels appetite remains inadequate.   Pt reports a UBW of 215 lb and denies unintentional wt loss PTA. Records indicate pt has maintained her weight of 97-103 kg over the last year.   Pt with +3 pitting edema in bilateral lower extremities per nursing screen.   I/O: +517 ml since admit  Medications: ferrous sulfate, MVI with minerals, miralax Labs: CBG  77-145  Diet Order:   Diet Order            Diet regular Room service appropriate? Yes; Fluid consistency: Thin  Diet effective now              EDUCATION NEEDS:   Not appropriate for education at this time  Skin:  Skin Assessment: Skin Integrity Issues: Skin Integrity Issues:: Unstageable, DTI, Other (Comment) DTI: left ankle  Unstageable: L/R ischium Other: right foot, labia   Last BM:  5/20  Height:   Ht Readings from Last 1 Encounters:  08/10/18 5\' 11"  (1.803 m)    Weight:   Wt Readings from Last 1 Encounters:  08/10/18 101.6 kg    Ideal Body Weight:  70.5 kg  BMI:  Body mass index is 31.24 kg/m.  Estimated Nutritional Needs:   Kcal:  2150-2350 kcal  Protein:  110-125 grams  Fluid:  >/= 2 L/day    Mariana Single RD, LDN Clinical Nutrition Pager # - [phone]

## 2018-08-11 NOTE — Consult Note (Addendum)
Physical Medicine and Rehabilitation Consult Reason for Consult: Decreased functional mobility Referring Physician: Family medicine   HPI: Sarah [name] is a 53 y.o. 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 and received inpatient rehab services 09/21/2017 until 10/30/2017 and was discharged to [facility] skilled nursing facility.  Patient has since returned home living with her sister and brother-in-law nonambulatory using sliding board with 1 person assist for bed to wheelchair transfers.  Sister and brother-in-law all assist in all aspects of bathing dressing toileting and functional mobility 1 level home with ramped entrance.  Presented 08/10/2018 with severe sacral decubitus ulcers that began in late February and have gradually worsened since that time as well as fever 102.5, WBC 16,800.  Placed on broad-spectrum antibiotics.  CT of the pelvis showed right inferior pubic ramus region gas containing decubitus wound new since February PET/CT but no CT evidence of osteomyelitis.  Underwent bedside debridement per general surgery with hydrotherapy initiated.  Hospital course pain management.  Therapy evaluations completed with recommendations of physical medicine rehab consult.  Patient feels like her arm strength is the same as before, her leg strength continues to be nonfunctional   Review of Systems  Constitutional: Positive for fever. Negative for chills.  HENT: Negative for hearing loss.   Eyes: Negative for blurred vision and double vision.  Respiratory: Positive for shortness of breath. Negative for cough.   Cardiovascular: Positive for leg swelling. Negative for chest pain and palpitations.  Gastrointestinal: Positive for constipation.  Negative for heartburn, nausea and vomiting.       GERD  Genitourinary: Negative for dysuria and hematuria.  Musculoskeletal: Positive for back pain, joint pain and myalgias.  Skin: Negative for rash.  Psychiatric/Behavioral: Positive for depression. The patient has insomnia.        Anxiety  All other systems reviewed and are negative.  Past Medical History:  Diagnosis Date   Anxiety    Cancer ([place])    FNA of thyroid positive for onconytic/hurthle cell carcinoma   Chronic back pain    Complication of anesthesia    Nausea and vomiting   DDD (degenerative disc disease), cervical    Depression    DJD (degenerative joint disease)    Family history of adverse reaction to anesthesia    MOTHER HAS NAUSEA    GERD (gastroesophageal reflux disease)    History of blood transfusion    08/2017   History of rectal fissure    HIT (heparin-induced thrombocytopenia) (HCC)    Hypertension    05/27/2018- not current- runs low   Hypothyroidism    Obesity    Paraplegia at T4 level ([place])    Pneumonia 03/2018   Pulmonary embolus, right ([place]) 2015   Rotator cuff tendonitis right   Past Surgical History:  Procedure Laterality Date   APPLICATION OF CRANIAL NAVIGATION N/A 06/01/2018   Procedure: APPLICATION OF CRANIAL NAVIGATION;  Surgeon: Erline Levine, MD;  Location: [place];  Service: Neurosurgery;  Laterality: N/A;   CHOLECYSTECTOMY N/A 01/25/2018   Procedure: LAPAROSCOPIC CHOLECYSTECTOMY;  Surgeon: Virl Cagey, MD;  Location: [facility];  Service: General;  Laterality: N/A;   CRANIOTOMY N/A 06/01/2018   Procedure: Craniectomy for skull metastasis with Brainlab;  Surgeon: Erline Levine, MD;  Location: [place];  Service: Neurosurgery;  Laterality: N/A;   FEMUR IM NAIL Left 09/11/2017   Procedure: INTRAMEDULLARY (IM) RETROGRADE FEMORAL NAILING;  Surgeon: Altamese Longfellow, MD;  Location: [place];  Service: Orthopedics;  Laterality: Left;   LAMINECTOMY N/A 12/14/2013   Procedure:  THORACIC LAMINECTOMY FOR TUMOR THORACIC THREE;  Surgeon: Ashok Pall, MD;  Location: [place] [facility];  Service: Neurosurgery;  Laterality: N/A;  THORACIC LAMINECTOMY FOR TUMOR THORACIC THREE   LAMINECTOMY N/A 07/05/2015   Procedure: THORACIC LAMINECTOMY FOR TUMOR;  Surgeon: Ashok Pall, MD;  Location: [place] [facility];  Service: Neurosurgery;  Laterality: N/A;   LAMINECTOMY N/A 09/03/2017   Procedure: POSTERIOR SPINAL TUMOR RESECTION THORACIC THREE;  Surgeon: Ashok Pall, MD;  Location: [place];  Service: Neurosurgery;  Laterality: N/A;  POSTERIOR SPINAL TUMOR RESECTION THORACIC THREE   POSTERIOR LUMBAR FUSION 4 LEVEL N/A 12/30/2013   Procedure: Thoracic one-Thoracic five posterior thoracic fusion with pedicle screws;  Surgeon: Ashok Pall, MD;  Location: [place] [facility];  Service: Neurosurgery;  Laterality: N/A;  Thoracic one-Thoracic five posterior thoracic fusion with pedicle screws   THYROIDECTOMY N/A 01/09/2014   Procedure: TOTAL THYROIDECTOMY;  Surgeon: Izora Gala, MD;  Location: [facility];  Service: ENT;  Laterality: N/A;   TONSILLECTOMY     Family History  Problem Relation Age of Onset   Hypertension Mother    Diabetes Mother    Hypertension Father    Stroke Father    Diabetes Father    Diabetes Sister    Diabetes Sister    Social History:  reports that she has never smoked. She has never used smokeless tobacco. She reports that she does not drink alcohol or use drugs. Allergies:  Allergies  Allergen Reactions   Bee Venom Anaphylaxis   Heparin Other (See Comments)    Weak positive platelets induced antibodies, SRA negative--2015   Keflex [Cephalexin] Nausea And Vomiting   Tramadol Nausea And Vomiting   Gabapentin Nausea And Vomiting   Hydrochlorothiazide Nausea And Vomiting   Hydrocodone Nausea And Vomiting   Oxycodone-Acetaminophen Nausea And Vomiting   Sorbitol Nausea Only    And abdominal distress   Tizanidine     Fatigue    Medications Prior to Admission    Medication Sig Dispense Refill   acetaminophen (TYLENOL) 325 MG tablet Take 2 tablets (650 mg total) by mouth every 4 (four) hours as needed for mild pain ((score 1 to 3) or temp > 100.5).     albuterol (PROVENTIL) (2.5 MG/3ML) 0.083% nebulizer solution Take 3 mLs (2.5 mg total) by nebulization every 6 (six) hours as needed for wheezing or shortness of breath. 75 mL 12   baclofen (LIORESAL) 20 MG tablet Take 1 tablet (20 mg total) by mouth 2 (two) times daily. 60 tablet 4   cetirizine (ZYRTEC) 10 MG tablet Take 1 tablet (10 mg total) by mouth daily. 30 tablet 11   fluticasone (FLONASE) 50 MCG/ACT nasal spray Place 2 sprays into both nostrils daily. 16 g 6   HYDROcodone-acetaminophen (NORCO/VICODIN) 5-325 MG tablet Take 1 tablet by mouth every 8 (eight) hours as needed for up to 10 days for moderate pain. 30 tablet 0   ipratropium-albuterol (DUONEB) 0.5-2.5 (3) MG/3ML SOLN Take 3 mLs by nebulization 3 (three) times daily. 360 mL    levothyroxine (SYNTHROID) 150 MCG tablet Take 1 tablet (150 mcg total) by mouth daily before breakfast. 30 tablet 1   Multiple Vitamin (MULTIVITAMIN WITH MINERALS) TABS tablet Take 1 tablet by mouth daily.     nystatin (MYCOSTATIN/NYSTOP) powder Apply topically  4 (four) times daily. To groin. 60 g 3   ondansetron (ZOFRAN) 4 MG tablet Take 1 tablet by mouth every 6 (six) hours as needed for nausea or vomiting.      oxymetazoline (AFRIN NASAL SPRAY) 0.05 % nasal spray Place 1 spray into both nostrils 2 (two) times daily. 30 mL 0   pantoprazole (PROTONIX) 40 MG tablet Take 1 tablet (40 mg total) by mouth daily. (Patient taking differently: Take 40 mg by mouth daily. ) 30 tablet 0   polyethylene glycol (MIRALAX / GLYCOLAX) packet Take 17 g by mouth daily. 14 each 0   rivaroxaban (XARELTO) 20 MG TABS tablet Take 1 tablet (20 mg total) by mouth daily with supper. 30 tablet 3   senna (SENOKOT) 8.6 MG TABS tablet Take 1 tablet (8.6 mg total) by mouth at bedtime as  needed for mild constipation. 120 each 0   sertraline (ZOLOFT) 50 MG tablet Take 50 mg by mouth daily.      fluconazole (DIFLUCAN) 100 MG tablet Take 1 tablet (100 mg total) by mouth daily. 14 tablet 0    Home: Home Living Family/patient expects to be discharged to:: Private residence Living Arrangements: Other relatives Available Help at Discharge: Available PRN/intermittently Type of Home: House Home Access: Ramped entrance Home Layout: One level Bathroom Shower/Tub: Chiropodist: Handicapped height Bathroom Accessibility: No Home Equipment: Environmental consultant - 2 wheels, Bedside commode, Shower seat, Wheelchair - power, Wheelchair - manual, Hospital bed, Other (comment) Additional Comments: Bathroom is not accessible.  Pt sponge bathes and performs bowel program supine.  uses sliding board for transfers to power wheelchair  Functional History: Prior Function Level of Independence: Needs assistance Gait / Transfers Assistance Needed: nonambulatory, uses sliding board with 1 person assist for bed<>w/c transfers ADL's / Homemaking Assistance Needed: Requires "a lot" of help from family  Comments: Pt reports using RW for short distances with therapy only until May 2019 Functional Status:  Mobility: Bed Mobility Overal bed mobility: Needs Assistance Bed Mobility: Rolling, Sidelying to Sit, Sit to Sidelying Rolling: Mod assist, +2 for physical assistance Sidelying to sit: Mod assist, +2 for physical assistance Sit to sidelying: Mod assist, +2 for physical assistance General bed mobility comments: Heavy mod assist and use of bed pad to cradle hips and stabilize LEs to roll and come to sit Transfers Overall transfer level: Needs assistance Transfers: Lateral/Scoot Transfers  Lateral/Scoot Transfers: +2 physical assistance, Mod assist General transfer comment: Simulated lateral scoot transfer, scooting to HOB, using bed pad to minimize shear      ADL: ADL Overall ADL's :  Needs assistance/impaired Eating/Feeding: Independent Grooming: Wash/dry hands, Wash/dry face, Oral care, Brushing hair, Set up, Bed level Upper Body Bathing: Minimal assistance, Bed level Upper Body Bathing Details (indicate cue type and reason): assist due to pain  Lower Body Bathing: Maximal assistance, Bed level Upper Body Dressing : Maximal assistance, Bed level Lower Body Dressing: Total assistance, Bed level Toilet Transfer: Total assistance Toilet Transfer Details (indicate cue type and reason): does not perform at home.  performs bowel program supine  Toileting- Clothing Manipulation and Hygiene: Total assistance, Bed level Functional mobility during ADLs: Maximal assistance, +2 for physical assistance General ADL Comments: Pt limited by Pain Lt breast area   Cognition: Cognition Overall Cognitive Status: Within Functional Limits for tasks assessed Cognition Arousal/Alertness: Awake/alert Behavior During Therapy: WFL for tasks assessed/performed Overall Cognitive Status: Within Functional Limits for tasks assessed  Blood pressure (!) 90/57, pulse 70, temperature 98.7 F (37.1 C),  temperature source Oral, resp. rate 16, height 5\' 11"  (1.803 m), weight 101.6 kg, last menstrual period 10/22/2016, SpO2 97 %. Physical Exam  Neurological:  [name] alert complaints of muscle spasms.  Oriented to person place and time.  Follows commands.  Skin:  Wound was not examined as patient was in bed and could not turn   General: No acute distress Mood and affect are appropriate Heart: Regular rate and rhythm no rubs murmurs or extra sounds Lungs: Clear to auscultation, breathing unlabored, no rales or wheezes Abdomen: Positive bowel sounds, soft nontender to palpation, nondistended Extremities: No clubbing, cyanosis, or edema Skin: No evidence of breakdown, no evidence of rash Neurologic: Cranial nerves II through XII intact, motor strength is 5/5 in bilateral deltoid, bicep, tricep,  grip,0/5 Bilateral  hip flexor, knee extensors, ankle dorsiflexor and plantar flexor Sensory exam normal sensation to light touch and proprioception in bilateral upper and lower extremities Cerebellar exam normal finger to nose to finger as well as heel to shin in bilateral upper and lower extremities Musculoskeletal: Full range of motion in all 4 extremities. No joint swelling  Results for orders placed or performed during the hospital encounter of 08/09/18 (from the past 24 hour(s))  Hemoglobin and hematocrit, blood     Status: Abnormal   Collection Time: 08/10/18 10:15 AM  Result Value Ref Range   Hemoglobin 7.5 (L) 12.0 - 15.0 g/dL   HCT 23.6 (L) 36.0 - 70.0 %  Basic metabolic panel     Status: Abnormal   Collection Time: 08/11/18  3:17 AM  Result Value Ref Range   Sodium 137 135 - 145 mmol/L   Potassium 4.0 3.5 - 5.1 mmol/L   Chloride 104 98 - 111 mmol/L   CO2 26 22 - 32 mmol/L   Glucose, Bld 77 70 - 99 mg/dL   BUN 15 6 - 20 mg/dL   Creatinine, Ser 1.11 (H) 0.44 - 1.00 mg/dL   Calcium 7.5 (L) 8.9 - 10.3 mg/dL   GFR calc non Af Amer 57 (L) >60 mL/min   GFR calc Af Amer >60 >60 mL/min   Anion gap 7 5 - 15  CBC with Differential/Platelet     Status: Abnormal   Collection Time: 08/11/18  3:17 AM  Result Value Ref Range   WBC 8.1 4.0 - 10.5 K/uL   RBC 2.16 (L) 3.87 - 5.11 MIL/uL   Hemoglobin 6.3 (LL) 12.0 - 15.0 g/dL   HCT 20.1 (L) 36.0 - 46.0 %   MCV 93.1 80.0 - 100.0 fL   MCH 29.2 26.0 - 34.0 pg   MCHC 31.3 30.0 - 36.0 g/dL   RDW 13.6 11.5 - 15.5 %   Platelets 198 150 - 400 K/uL   nRBC 0.0 0.0 - 0.2 %   Neutrophils Relative % 85 %   Neutro Abs 6.9 1.7 - 7.7 K/uL   Lymphocytes Relative 8 %   Lymphs Abs 0.6 (L) 0.7 - 4.0 K/uL   Monocytes Relative 4 %   Monocytes Absolute 0.4 0.1 - 1.0 K/uL   Eosinophils Relative 2 %   Eosinophils Absolute 0.2 0.0 - 0.5 K/uL   Basophils Relative 0 %   Basophils Absolute 0.0 0.0 - 0.1 K/uL   Immature Granulocytes 1 %   Abs Immature  Granulocytes 0.05 0.00 - 0.07 K/uL   *Note: Due to a large number of results and/or encounters for the requested time period, some results have not been displayed. A complete set of results can be found  in Results Review.   Dg Chest 2 View  Result Date: 08/09/2018 CLINICAL DATA:  53 year old female with suspected sepsis. Metastatic Hurthle cell thyroid carcinoma. EXAM: CHEST - 2 VIEW COMPARISON:  PET-CT 05/13/2018 and earlier. FINDINGS: AP and lateral views of the chest. Mildly lower lung volumes compared to 2018. Stable cardiac size and mediastinal contours. No pneumothorax, pulmonary edema, pleural effusion or confluent pulmonary opacity. Left greater than right pleural effusions have resolved since February. Visualized tracheal air column is within normal limits. Chronic cervicothoracic/upper thoracic spinal hardware. Stable visualized osseous structures. Negative visible bowel gas pattern. IMPRESSION: No acute cardiopulmonary abnormality. Electronically Signed   By: Genevie Ann M.D.   On: 08/09/2018 22:04   Ct Pelvis W Contrast  Result Date: 08/10/2018 CLINICAL DATA:  53 year old female with suspected sepsis. Metastatic Hurthle cell thyroid carcinoma. Decubitus ulcers, query osteomyelitis. EXAM: CT PELVIS WITH CONTRAST TECHNIQUE: Multidetector CT imaging of the pelvis was performed using the standard protocol following the bolus administration of intravenous contrast. CONTRAST:  193mL OMNIPAQUE IOHEXOL 300 MG/ML  SOLN COMPARISON:  PET-CT 05/13/2018. CT abdomen, and Pelvis 03/03/2019. FINDINGS: Urinary Tract: Foley catheter in place. Urinary bladder decompressed. Visible ureters appear normal. Bowel: Retained stool in the distal large bowel including a 7 centimeter stool ball in the rectum. Mild presacral stranding. No pelvic free fluid. Visible large bowel loops are nondilated. Negative appendix. Vascular/Lymphatic: The visible Major arterial structures and central pelvic veins appear to be enhancing and  patent. No lymphadenopathy. Reproductive:  Negative. Other:  No pelvic free fluid. Musculoskeletal: Right inferior pubic ramus region gas containing skin wound on series 3, image 53 is new since the February PET-CT. No organized or drainable fluid collection is associated. The wound extends to near the periosteum of the inferior pubic ramus but there are no CT changes of osteomyelitis identified. Stable visualized osseous structures. Previous left femur ORIF. There is scattered additional patchy subcutaneous stranding including overlying the lower lumbar spine on series 3, image 1. But no other confluent decubitus wound. IMPRESSION: 1. Right inferior pubic ramus region gas containing decubitus wound is new since the February PET-CT and is in proximity to the right inferior pubic ramus but there is no CT evidence of osteomyelitis. 2. Lesser areas of decubitus soft tissue inflammation elsewhere. 3. Stool ball in the rectum, consider fecal impaction. Electronically Signed   By: Genevie Ann M.D.   On: 08/10/2018 02:34     Assessment/Plan: Diagnosis: Debility after sacral decubitus debridement 1. Does the need for close, 24 hr/day medical supervision in concert with the patient's rehab needs make it unreasonable for this patient to be served in a less intensive setting? Potentially 2. Co-Morbidities requiring supervision/potential complications: T3 complete paraplegia secondary to due to metastatic thyroid carcinoma with neurogenic bowel bladder and severe spasticity bladder management, bowel management, safety, skin/wound care, disease management, medication administration, pain management and patient education, does the patient require 24 hr/day rehab nursing? Potentially 3. Does the patient require coordinated care of a physician, rehab nurse, PT (1-2 hrs/day, 5 days/week) and OT (1-2 hrs/day, 5 days/week) to address physical and functional deficits in the context of the above medical diagnosis(es)?  Potentially Addressing deficits in the following areas: balance, endurance, locomotion, strength, transferring, bowel/bladder control, bathing, dressing, feeding, grooming, toileting and psychosocial support 4. Can the patient actively participate in an intensive therapy program of at least 3 hrs of therapy per day at least 5 days per week? Potentially 5. The potential for patient to make measurable gains while on  inpatient rehab is Patient appears to be at baseline therefore any rehab progress would likely be minimal 6. Anticipated functional outcomes upon discharge from inpatient rehab are n/a  with PT, n/a with OT, n/a with SLP. 7. Estimated rehab length of stay to reach the above functional goals is: NA 8. Anticipated D/C setting: Home 9. Anticipated post D/C treatments: HH therapy 10. Overall Rehab/Functional Prognosis: fair  RECOMMENDATIONS: This patient's condition is appropriate for continued rehabilitative care in the following setting: Patient appears to be at functional baseline therefore home with home health is appropriate once medically stable Patient has agreed to participate in recommended program. Potentially Note that insurance prior authorization may be required for reimbursement for recommended care.  Comment: If patient experiences any further decline of functional status may benefit from short inpatient rehab stay  Would recommend increase of baclofen to 20 mg 3 times daily and follow-up with Dr. Naaman Plummer in physical medicine rehab outpatient clinic "I have personally performed a face to face diagnostic evaluation of this patient.  Additionally, I have reviewed and concur with the physician assistant's documentation above." [name] M.D. [facility] FAAPM&R (Sports Med, Neuromuscular Med) Diplomate Am Board of Electrodiagnostic Med  Cathlyn Parsons, PA-C 08/11/2018

## 2018-08-11 NOTE — Progress Notes (Signed)
Family Medicine Teaching Service Daily Progress Note Intern Pager: [phone]  Patient name: Sarah [name] Medical record number: [id number] Date of birth: May 02, 1965 Age: 53 y.o. Gender: female  Primary Care Provider: [name], MD Consultants: General Surgery Code Status: Full  Pt Overview and Major Events to Date:  5/19 Admitted FPTS, wound debridement  Assessment and Plan: Sarah [name] is a 53 y.o. female presenting with worsening of sacral wound and fever concerning for bacterial wound infection . PMH is significant for paraplegia, metastatic cancer,  GERD, depression,   Fever 2/2 sacral wound infection Febrile to 102.5 in ED, has remained afebrile since that time.  WBC 6.3>16.8>8.1.  Surgery consulted due to CT abdomen pelvis showing decubitus when containing gas.  Department performed by general surgery yesterday.  Patient remains on vancomycin and Zosyn.  Blood cultures no growth greater than 24 hours.  Urine culture pending, as could also be source of fever given patient is chronically incontinent of urine and stool.  This a.m. states that she is feeling well. - continue vanc per pharm, and zosyn 4.5g q6h to cover for pseudomonas.   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  Recent craniotomy in march.  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  She is incontinent of bowel and bladder. She takes baclofen for muscle spasms and synthroid for postop hypothyroidism. Chronic sacral wounds as described above.  - continue baclofen - continue synthroid - PT/OT: CIR, have been consulted - wound care consult  Normocytic Anemia  Has had previous transfusions and GI evaluation that was negative.  Patient on Xarelto chronically, currently on hold.  Hemoglobin this a.m. 6.3 at 0300, stat H&H 6.1.  Transfusion  infusion threshold 7. Prior iron panel has suggested IDA with low iron, although low TIBC, but also low sat.  Given acute infection and need for transfusion, would hold off on IV iron.  Will start PO iron supplementation. - transfuse 1 unit pRBC - post-transfusion H&H - monitor CBC - ferrous sulfate QOD  Constipation Large stool ball on CT abd/pelvis.  Patient states that she takes miralax and snna at home and usually has enemas while inpatient.  Patient has had numerous bowel movements since that time and received a tap water enema with good output. 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  No current increased risk of bleeding.   -Holding Xarelto per surgery recs  Vaginal candidiasis - multiple recent office visits for vaginal candidiasis and candidal intertrigo.  She has been using nystatin powder. On initial external vaginal exam, did not appreciate any erythema or signs of candidiasis.   - holding nystatin powder.   GERD - takes pantoprazole 40mg  at home.   - continue home meds  Depression - takes zoloft 50mg  at home.  - continue home meds  Hypothyroidism - 2/2 thyroid cancer and thyroidectomy.  Takes 170mcg daily at home - continue home meds.   Seasonal allergies - patient takes flonase and zyrtec at home.  - continue flonase - claritin on forumulary  FEN/GI: Regular PPx: SCDs, while holding Xarelto  Disposition: Being evaluated for CIR  Subjective:  Patient states that she is feeling well this morning, better than yesterday.  She denies any complaints at present.  She states that she is agreeable to  CIR.  Objective: Temp:  [97.7 F (36.5 C)-98.7 F (37.1 C)] 98.7 F (37.1 C) (05/20 0342) Pulse Rate:  [61-75] 75 (05/20 1213) Resp:  [16-18] 18 (05/20 1213) BP: (84-116)/(52-72) 116/72 (05/20 1213) SpO2:  [97 %-100 %] 100 % (05/20 1213) Weight:  [101.6 kg] 101.6 kg (05/19 2214)  Physical Exam:  General: 53 y.o.  female in NAD, lying up in bed Cardio: RRR no m/r/g Lungs: CTAB, no wheezing, no rhonchi, no crackles, no IWOB on RA Abdomen: Soft, non-tender to palpation, non-distended, positive bowel sounds Skin: warm and dry, sacral wound with dressing over Extremities: No edema, paraplegic from T4 level down   Laboratory: Recent Labs  Lab 08/09/18 2129 08/10/18 0414 08/10/18 1015 08/11/18 0317 08/11/18 0752  WBC 6.3 16.8*  --  8.1  --   HGB 9.1* 7.0* 7.5* 6.3* 6.1*  HCT 28.9* 21.9* 23.6* 20.1* 19.8*  PLT 266 227  --  198  --    Recent Labs  Lab 08/09/18 2129 08/10/18 0414 08/11/18 0317  NA 135 133* 137  K 4.1 3.2* 4.0  CL 98 100 104  CO2 25 24 26   BUN 17 16 15   CREATININE 1.15* 1.23* 1.11*  CALCIUM 8.3* 7.4* 7.5*  PROT 6.8  --   --   BILITOT 1.0  --   --   ALKPHOS 387*  --   --   ALT 24  --   --   AST 29  --   --   GLUCOSE 145* 114* 77    Imaging/Diagnostic Tests: No results found.  Wol[place], DO 08/11/2018, 12:25 PM PGY-1, [place] Intern pager: [phone], text pages welcome

## 2018-08-11 NOTE — Progress Notes (Signed)
Inpatient Rehab Admissions:  Inpatient Rehab Consult received.  I met with patient at the bedside for rehabilitation assessment and to discuss goals and expectations of an inpatient rehab admission.  I let her know that consulting physician felt she was close to her baseline and could possibly d/c home with home health therapies.  She states that they discussed this but that she really would prefer to come to CIR to get stronger before returning home.  She reports that Dr. Naaman Plummer will follow up with her in the morning.  I will continue to follow and communicate with Dr. Naaman Plummer to determine most appropriate dispo.   Signed: Shann Medal, PT, DPT Admissions Coordinator [phone] 08/11/18  3:22 PM

## 2018-08-11 NOTE — Progress Notes (Signed)
Family Medicine Teaching Service Daily Progress Note Intern Pager: [phone]  Patient name: Sarah [name] Medical record number: [id number] Date of birth: 01-Apr-1965 Age: 53 y.o. Gender: female  Primary Care Provider: [name], MD Consultants: General Surgery Code Status: Full  Pt Overview and Major Events to Date:  5/19 Admitted FPTS, wound debridement  Assessment and Plan: Sarah [name] is a 53 y.o. female presenting with worsening of bilateral sacral ulcers and fever concerning for bacterial wound infection . PMH is significant for paraplegia, metastatic cancer,  GERD, depression,   Fever 2/2 sacral wound infection Febrile to 102.5 in ED, has remained afebrile since that time.  WBC 6.3>16.8>8.1.  Surgery consulted due to CT abdomen pelvis showing decubitus when containing gas.  Department performed by general surgery yesterday.  Patient remains on vancomycin and Zosyn.  Blood cultures no growth greater than 24 hours.  Urine culture pending, as could also be source of fever given patient is chronically incontinent of urine and stool.  This a.m. states that she is feeling well. - continue vanc per pharm, and zosyn 4.5g q6h to cover for pseudomonas.   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  Recent craniotomy in march.  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  She is incontinent of bowel and bladder. She takes baclofen for muscle spasms and synthroid for postop hypothyroidism. Chronic sacral wounds as described above.  - continue baclofen - continue synthroid - PT/OT: CIR, have been consulted - wound care consult  Normocytic Anemia  Has had previous transfusions and GI evaluation that was negative.  Patient on Xarelto chronically, currently on hold.  Hemoglobin this a.m. 6.3 at 0300, stat H&H pending.   Transfusion infusion threshold 7. - monitor CBC  Constipation Large stool ball on CT abd/pelvis.  Patient states that she takes miralax and snna at home and usually has enemas while inpatient.  Patient has had numerous bowel movements since that time and received a tap water enema with good output. 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  No current increased risk of bleeding.   -Holding Xarelto per surgery recs  Vaginal candidiasis - multiple recent office visits for vaginal candidiasis and candidal intertrigo.  She has been using nystatin powder. On initial external vaginal exam, did not appreciate any erythema or signs of candidiasis.   - holding nystatin powder.   GERD - takes pantoprazole 40mg  at home.   - continue home meds  Depression - takes zoloft 50mg  at home.  - continue home meds  Hypothyroidism - 2/2 thyroid cancer and thyroidectomy.  Takes 173mcg daily at home - continue home meds.   Seasonal allergies - patient takes flonase and zyrtec at home.  - continue flonase - claritin on forumulary  FEN/GI: Regular PPx: SCDs, while holding Xarelto  Disposition: Being evaluated for CIR  Subjective:  Patient states that she is feeling well this morning, better than yesterday.  She denies any complaints at present.  She states that she is agreeable to CIR.  Objective: Temp:  [97.6 F (36.4 C)-98.7 F (37.1 C)] 98.7 F (37.1 C) (05/20 0342) Pulse Rate:  [61-70] 70 (05/20 0342) Resp:  [16-18] 16 (05/20 0342) BP: (84-104)/(52-60) 90/57 (05/20 0342) SpO2:  [97 %-100 %] 97 % (05/20 0342) Weight:  [  101.6 kg] 101.6 kg (05/19 2214)  Physical Exam:  General: 53 y.o. female in NAD, lying up in bed Cardio: RRR no m/r/g Lungs: CTAB, no wheezing, no rhonchi, no crackles, no IWOB on RA Abdomen: Soft, non-tender to palpation, non-distended, positive bowel sounds Skin: warm and dry, sacral wound with dressing  over Extremities: No edema, paraplegic from T4 level down   Laboratory: Recent Labs  Lab 08/09/18 2129 08/10/18 0414 08/10/18 1015 08/11/18 0317  WBC 6.3 16.8*  --  8.1  HGB 9.1* 7.0* 7.5* 6.3*  HCT 28.9* 21.9* 23.6* 20.1*  PLT 266 227  --  198   Recent Labs  Lab 08/09/18 2129 08/10/18 0414 08/11/18 0317  NA 135 133* 137  K 4.1 3.2* 4.0  CL 98 100 104  CO2 25 24 26   BUN 17 16 15   CREATININE 1.15* 1.23* 1.11*  CALCIUM 8.3* 7.4* 7.5*  PROT 6.8  --   --   BILITOT 1.0  --   --   ALKPHOS 387*  --   --   ALT 24  --   --   AST 29  --   --   GLUCOSE 145* 114* 77    Imaging/Diagnostic Tests: No results found.  [place], DO 08/11/2018, 8:02 AM PGY-1, [place] Intern pager: [phone], text pages welcome

## 2018-08-12 ENCOUNTER — Other Ambulatory Visit: Payer: Medicare Other

## 2018-08-12 DIAGNOSIS — N390 Urinary tract infection, site not specified: Secondary | ICD-10-CM

## 2018-08-12 DIAGNOSIS — B961 Klebsiella pneumoniae [K. pneumoniae] as the cause of diseases classified elsewhere: Secondary | ICD-10-CM

## 2018-08-12 DIAGNOSIS — R7881 Bacteremia: Secondary | ICD-10-CM

## 2018-08-12 DIAGNOSIS — D62 Acute posthemorrhagic anemia: Secondary | ICD-10-CM

## 2018-08-12 LAB — CBC
HCT: 24 % — ABNORMAL LOW (ref 36.0–46.0)
Hemoglobin: 7.8 g/dL — ABNORMAL LOW (ref 12.0–15.0)
MCH: 29.1 pg (ref 26.0–34.0)
MCHC: 32.5 g/dL (ref 30.0–36.0)
MCV: 89.6 fL (ref 80.0–100.0)
Platelets: 223 10*3/uL (ref 150–400)
RBC: 2.68 MIL/uL — ABNORMAL LOW (ref 3.87–5.11)
RDW: 15.3 % (ref 11.5–15.5)
WBC: 5.4 10*3/uL (ref 4.0–10.5)
nRBC: 0 % (ref 0.0–0.2)

## 2018-08-12 LAB — BLOOD CULTURE ID PANEL (REFLEXED)

## 2018-08-12 LAB — BASIC METABOLIC PANEL
Anion gap: 10 (ref 5–15)
BUN: 14 mg/dL (ref 6–20)
CO2: 22 mmol/L (ref 22–32)
Calcium: 7.5 mg/dL — ABNORMAL LOW (ref 8.9–10.3)
Chloride: 107 mmol/L (ref 98–111)
Creatinine, Ser: 1.12 mg/dL — ABNORMAL HIGH (ref 0.44–1.00)
GFR calc Af Amer: >60 mL/min (ref >60)
GFR calc non Af Amer: 56 mL/min — ABNORMAL LOW (ref >60)
Glucose, Bld: 82 mg/dL (ref 70–99)
Potassium: 4.2 mmol/L (ref 3.5–5.1)
Sodium: 139 mmol/L (ref 135–145)

## 2018-08-12 LAB — TYPE AND SCREEN
ABO/RH(D): O POS
Antibody Screen: NEGATIVE
Unit division: 0

## 2018-08-12 LAB — BPAM RBC
Blood Product Expiration Date: 202006202359
ISSUE DATE / TIME: 202005201218
Unit Type and Rh: 5100

## 2018-08-12 MED ORDER — GLYCERIN (LAXATIVE) 2.1 G RE SUPP
1.0000 | Freq: Every day | RECTAL | Status: DC
  Administered 2018-08-12 – 2018-08-16 (×3): 1 via RECTAL
  Filled 2018-08-12 (×6): qty 1

## 2018-08-12 MED ORDER — METRONIDAZOLE IN NACL 5-0.79 MG/ML-% IV SOLN
500.0000 mg | Freq: Three times a day (TID) | INTRAVENOUS | Status: DC
  Administered 2018-08-12 – 2018-08-13 (×5): 500 mg via INTRAVENOUS
  Filled 2018-08-12 (×5): qty 100

## 2018-08-12 MED ORDER — SODIUM CHLORIDE 0.9 % IV SOLN
2.0000 g | Freq: Three times a day (TID) | INTRAVENOUS | Status: DC
  Administered 2018-08-12 – 2018-08-15 (×9): 2 g via INTRAVENOUS
  Filled 2018-08-12 (×11): qty 2

## 2018-08-12 NOTE — Progress Notes (Signed)
PHARMACY - PHYSICIAN COMMUNICATION CRITICAL VALUE ALERT - BLOOD CULTURE IDENTIFICATION (BCID)  Sarah [name] is an 53 y.o. female who presented to [facility] on 08/09/2018 with a chief complaint of  Stage IV gluteal decubitus ulcers.   Assessment:  53 year old female with large decubitus ulcers secondary to paraplegia. Now with gm positive cocci in 1/4 blood cultures with nothing detected on BCID. This could potentially be a coagulase negative staph that is not detected on BCID which would be covered by Vancomycin.   Name of physician (or Provider) Contacted: FM rounding service   Current antibiotics: Vanc/Zosyn  Changes to prescribed antibiotics recommended:  None- Could consider switching Zosyn to Cefepime and Flagyl to limit renal toxicity.   Results for orders placed or performed during the hospital encounter of 08/09/18  Blood Culture ID Panel (Reflexed) (Collected: 08/09/2018  9:29 PM)  Result Value Ref Range   Enterococcus species NOT DETECTED NOT DETECTED   Listeria monocytogenes NOT DETECTED NOT DETECTED   Staphylococcus species NOT DETECTED NOT DETECTED   Staphylococcus aureus (BCID) NOT DETECTED NOT DETECTED   Streptococcus species NOT DETECTED NOT DETECTED   Streptococcus agalactiae NOT DETECTED NOT DETECTED   Streptococcus pneumoniae NOT DETECTED NOT DETECTED   Streptococcus pyogenes NOT DETECTED NOT DETECTED   Acinetobacter baumannii NOT DETECTED NOT DETECTED   Enterobacteriaceae species NOT DETECTED NOT DETECTED   Enterobacter cloacae complex NOT DETECTED NOT DETECTED   Escherichia coli NOT DETECTED NOT DETECTED   Klebsiella oxytoca NOT DETECTED NOT DETECTED   Klebsiella pneumoniae NOT DETECTED NOT DETECTED   Proteus species NOT DETECTED NOT DETECTED   Serratia marcescens NOT DETECTED NOT DETECTED   Haemophilus influenzae NOT DETECTED NOT DETECTED   Neisseria meningitidis NOT DETECTED NOT DETECTED   Pseudomonas aeruginosa NOT DETECTED NOT DETECTED   Candida albicans  NOT DETECTED NOT DETECTED   Candida glabrata NOT DETECTED NOT DETECTED   Candida krusei NOT DETECTED NOT DETECTED   Candida parapsilosis NOT DETECTED NOT DETECTED   Candida tropicalis NOT DETECTED NOT DETECTED    Jimmy Footman, PharmD, BCPS, BCIDP Infectious Diseases Clinical Pharmacist Phone: [phone] 08/12/2018  10:38 AM

## 2018-08-12 NOTE — Progress Notes (Signed)
Inpatient Rehab Admissions Coordinator:   I met with patient at bedside following medical director assessment.  Dr. Naaman Plummer feels patient has the potential to make progress on CIR and patient is agreeable.  I will proceed with possible admission pending insurance authorization and bed availability.   Shann Medal, PT, DPT Admissions Coordinator [phone] 08/12/18  12:32 PM

## 2018-08-12 NOTE — Progress Notes (Addendum)
Family Medicine Teaching Service Daily Progress Note Intern Pager: [phone]  Patient name: Sarah [name] Medical record number: [id number] Date of birth: 09/29/1965 Age: 53 y.o. Gender: female  Primary Care Provider: [name], MD Consultants: General Surgery Code Status: Full  Pt Overview and Major Events to Date:  5/19 Admitted FPTS, wound debridement  Assessment and Plan: Sarah [name] is a 53 y.o. female presenting with worsening of gluteal wound and fever concerning for bacterial wound infection . PMH is significant for paraplegia, metastatic cancer,  GERD, depression,   Fever 2/2 b/l stage IV inferior gluteal decub ulcers Febrile to 102.5 in ED, has remained afebrile since that time.  WBC 6.3>16.8>8.1>5.4.  Surgery consulted due to CT abdomen pelvis showing decubitus when containing gas.  Debridement performed by general surgery 5/19, may need more per surgery note.  Patient remains on vancomycin and Zosyn.  Initial Blood Cx on 5/18 at 2129 NG3D aerobic tube, with anaerobic bottle GPC, repeat Blood Cx on 5/19 at 0117 is NG2D.  Urine culture >100,000 colonies klebsiella.  Patient feeling well this morning and has no complaints. 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  Recent craniotomy in march.  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  She is incontinent of bowel and bladder. She takes baclofen for muscle spasms and synthroid for postop hypothyroidism. Chronic sacral wounds as described above.  - continue baclofen - continue synthroid - PT/OT: CIR, have been consulted - wound care consult  Normocytic Anemia  Has had previous transfusions and GI evaluation that was negative.  Patient on Xarelto chronically, currently on  hold.  Transfusion infusion threshold 7. Prior iron panel has suggested IDA with low iron, although low TIBC, but also low sat.  Given acute infection and need for transfusion, would hold off on IV iron.  Hgb 6.1>1 unit pRBC>8.1.  Hgb 7.8 this AM. - monitor CBC - ferrous sulfate QOD  Constipation Large stool ball on CT abd/pelvis.  Patient states that she takes miralax and snna at home and usually has enemas while inpatient.  Patient has had numerous bowel movements since that time and received a tap water enema with good output.  Patient also states she usually has scheduled glycerin suppositories while inpatient, requesting that today. 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  No current increased risk of bleeding.   -Holding Xarelto per surgery recs  Vaginal candidiasis - multiple recent office visits for vaginal candidiasis and candidal intertrigo.  She has been using nystatin powder. On initial external vaginal exam, did not appreciate any erythema or signs of candidiasis.   - holding nystatin powder.   GERD - takes pantoprazole 40mg  at home.   - continue home meds  Depression - takes zoloft 50mg  at home.  - continue home meds  Hypothyroidism - 2/2 thyroid cancer and thyroidectomy.  Takes 136mcg daily at home - continue home meds.   Seasonal allergies - patient takes flonase and zyrtec at home.  - continue flonase - claritin on forumulary  FEN/GI: Regular PPx: SCDs, while holding Xarelto  Disposition: Being evaluated for CIR  Subjective:  Patient states that she is feeling well this morning and has no complaints.  She continues to want to go to CIR.  Also requesting glycerin suppository today, she states that she usually takes these scheduled while in the hospital.  Objective: Temp:  [97.4 F (36.3 C)-98.5 F (36.9 C)] 97.4 F (36.3 C) (05/21 0523) Pulse Rate:  [53-75] 53 (05/21  0523) Resp:  [16-18] 16 (05/21 0523) BP: (111-121)/(71-85) 121/82 (05/21 0523) SpO2:  [97 %-100 %] 97 % (05/21 0523) Weight:  [101.7 kg] 101.7 kg (05/20 2111)  Physical Exam:  General: 53 y.o. female in NAD, lying in bed Cardio: RRR no m/r/g Lungs: CTAB, no wheezing, no rhonchi, no crackles, no IWOB on room air Abdomen: Soft, non-tender to palpation, non-distended, positive bowel sounds Skin: warm and dry, gluteal wounds per surgery and wound care notes Extremities: No edema, paraplegic from T4 level   Laboratory: Recent Labs  Lab 08/10/18 0414  08/11/18 0317 08/11/18 0752 08/11/18 1805 08/12/18 0353  WBC 16.8*  --  8.1  --   --  5.4  HGB 7.0*   < > 6.3* 6.1* 8.1* 7.8*  HCT 21.9*   < > 20.1* 19.8* 25.2* 24.0*  PLT 227  --  198  --   --  223   < > = values in this interval not displayed.   Recent Labs  Lab 08/09/18 2129 08/10/18 0414 08/11/18 0317 08/12/18 0353  NA 135 133* 137 139  K 4.1 3.2* 4.0 4.2  CL 98 100 104 107  CO2 25 24 26 22   BUN 17 16 15 14   CREATININE 1.15* 1.23* 1.11* 1.12*  CALCIUM 8.3* 7.4* 7.5* 7.5*  PROT 6.8  --   --   --   BILITOT 1.0  --   --   --   ALKPHOS 387*  --   --   --   ALT 24  --   --   --   AST 29  --   --   --   GLUCOSE 145* 114* 77 82    Imaging/Diagnostic Tests: No results found.  [place], DO 08/12/2018, 8:06 AM PGY-1, [place] Intern pager: [phone], text pages welcome

## 2018-08-12 NOTE — Progress Notes (Signed)
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 Pt tolerated treatment well with some complaints of pain in the L breast area. Pt engaged in bed mobility to transition to EOB for UE exercises. Pt required Mod A +2 and cueing for hand placement on bed rail. Pt unable to sit at EOB to engage in UE exercises. Pt sat for a limited time then transitioned back into reclined position with instructions to use yellow theraband for UE exercises. Pt will benefit from continued acute OT to address UE strength for functional mobility and ADLs. DC and freq remains the same. OT will continue to follow acutely.  Follow Up Recommendations  Supervision/Assistance - 24 hour;CIR    Equipment Recommendations  None recommended by OT    Recommendations for Other Services      Precautions / Restrictions Precautions Precautions: Fall Precaution Comments: Watch for shear with transfers       Mobility Bed Mobility Overal bed mobility: Needs Assistance Bed Mobility: Rolling;Sidelying to Sit;Sit to Sidelying Rolling: Mod assist;+2 for physical assistance Sidelying to sit: Mod assist;+2 for physical assistance     Sit to sidelying: Mod assist;+2 for physical assistance General bed mobility comments: Heavy mod assist and use of bed pad to cradle hips and stabilize LEs to roll and come to sit  Transfers Overall transfer level: Needs assistance   Transfers: Lateral/Scoot Transfers          Lateral/Scoot Transfers: +2 physical assistance;Mod assist General transfer comment: Simulated lateral scoot  transfer, scooting to HOB, using bed pad to minimize shear    Balance Overall balance assessment: Needs assistance   Sitting balance-Leahy Scale: Poor Sitting balance - Comments: decreased core strength and stability to sit independently at EOB.                                    ADL either performed or assessed with clinical judgement   ADL Overall ADL's : Needs assistance/impaired                                     Functional mobility during ADLs: Maximal assistance;+2 for physical assistance General ADL Comments: Intervention involved transition for bed mobility to prepare for functional mobility. Pt required heavy ModA+2 with cueing hand placement on hand rails to assist with power up. Pt unable to sit independently at EOB required full trunk support. Pt engaged in UE exercise in bed to improve UE strength for ADLs.     Vision   Vision Assessment?: No apparent visual deficits   Perception     Praxis      Cognition Arousal/Alertness: Awake/alert Behavior During Therapy: WFL for tasks assessed/performed Overall Cognitive Status: Within Functional Limits for tasks assessed                                          Exercises Exercises: General Upper  Extremity General Exercises - Upper Extremity Shoulder Horizontal ABduction: Strengthening;Both;10 reps;Seated;Theraband(2 sets each) Theraband Level (Shoulder Horizontal Abduction): Level 1 (Yellow) Shoulder Horizontal ADduction: Strengthening;Both;10 reps;Seated;Theraband Theraband Level (Shoulder Horizontal Adduction): Level 1 (Yellow)(2 sets each) Elbow Flexion: Strengthening;Both;10 reps;Theraband;Seated Theraband Level (Elbow Flexion): Level 1 (Yellow) Elbow Extension: Strengthening;10 reps;Seated;Both;Theraband Theraband Level (Elbow Extension): Level 1 (Yellow)   Shoulder Instructions       General Comments Unable to perform full UE exercise while sitting up at EOB due  to decreased core strength and stability.     Pertinent Vitals/ Pain       Pain Assessment: Faces Faces Pain Scale: Hurts little more Pain Location: Lt breast  Pain Descriptors / Indicators: Grimacing;Guarding;Throbbing Pain Intervention(s): Monitored during session;Repositioned  Home Living                                          Prior Functioning/Environment              Frequency  Min 2X/week        Progress Toward Goals  OT Goals(current goals can now be found in the care plan section)  Progress towards OT goals: Progressing toward goals  Acute Rehab OT Goals Patient Stated Goal: to get wounds resolved, decrease pain in chest and return home  OT Goal Formulation: With patient Time For Goal Achievement: 08/24/18 Potential to Achieve Goals: Good ADL Goals Pt Will Perform Upper Body Bathing: with set-up;bed level;sitting Pt Will Perform Lower Body Bathing: with mod assist;bed level Pt Will Perform Upper Body Dressing: with min assist;sitting  Plan Discharge plan remains appropriate;Frequency remains appropriate    Co-evaluation                 AM-PAC OT "6 Clicks" Daily Activity     Outcome Measure   Help from another person eating meals?: None Help from another person taking care of personal grooming?: None Help from another person toileting, which includes using toliet, bedpan, or urinal?: Total Help from another person bathing (including washing, rinsing, drying)?: A Lot Help from another person to put on and taking off regular upper body clothing?: A Lot Help from another person to put on and taking off regular lower body clothing?: Total 6 Click Score: 14    End of Session    OT Visit Diagnosis: Pain Pain - Right/Left: Left Pain - part of body: (breast area )   Activity Tolerance Patient limited by pain   Patient Left in bed;with call bell/phone within reach   Nurse Communication Mobility status        Time:  1250-1318 OT Time Calculation (min): 28 min  Charges: OT General Charges $OT Visit: 1 Visit OT Treatments $Self Care/Home Management : 23-37 mins  Minus Breeding, MSOT, OTR/L  Supplemental Rehabilitation Services  [phone]   Marius Ditch 08/12/2018, 1:31 PM

## 2018-08-12 NOTE — Progress Notes (Signed)
Physical Therapy Wound Treatment Patient Details  Name: Sarah [name] MRN: [id number] Date of Birth: [birth year]  Today's Date: 08/12/2018 Time: 8182-9937 Time Calculation (min): 60 min  Subjective  Subjective: Pleasant and agreeable to hydrotherapy Patient and Family Stated Goals: Heal wounds Date of Onset: (Unknown) Prior Treatments: Bedside I&D  Pain Score:  Pt premedicated   Wound Assessment  Pressure Injury 08/10/18 Unstageable - Full thickness tissue loss in which the base of the ulcer is covered by slough (yellow, tan, gray, green or brown) and/or eschar (tan, brown or black) in the wound bed. (Active)  Dressing Type Gauze (Comment);Moist to dry;Foam 08/12/2018 10:37 AM  Dressing Clean;Dry;Intact 08/12/2018 10:37 AM  Dressing Change Frequency Daily 08/12/2018 10:37 AM  State of Healing Eschar 08/12/2018 10:37 AM  Site / Wound Assessment Black;Yellow;Pink 08/12/2018 10:37 AM  % Wound base Red or Granulating 30% 08/12/2018 10:37 AM  % Wound base Yellow/Fibrinous Exudate 30% 08/12/2018 10:37 AM  % Wound base Black/Eschar 40% 08/12/2018 10:37 AM  Peri-wound Assessment Intact;Induration 08/12/2018 10:37 AM  Wound Length (cm) 3.7 cm 08/11/2018  1:36 PM  Wound Width (cm) 4.1 cm 08/11/2018  1:36 PM  Wound Depth (cm) 2.1 cm 08/11/2018  1:36 PM  Wound Surface Area (cm^2) 15.17 cm^2 08/11/2018  1:36 PM  Wound Volume (cm^3) 31.86 cm^3 08/11/2018  1:36 PM  Tunneling (cm) 0 08/11/2018  1:36 PM  Undermining (cm) 0 08/11/2018  1:36 PM  Margins Unattached edges (unapproximated) 08/12/2018 10:37 AM  Drainage Amount Moderate 08/12/2018 10:37 AM  Drainage Description Purulent 08/12/2018 10:37 AM  Treatment Debridement (Selective);Hydrotherapy (Pulse lavage);Packing (Saline gauze) 08/12/2018 10:37 AM   Santyl applied to wound bed prior to applying dressing.    Pressure Injury 08/10/18 Unstageable - Full thickness tissue loss in which the base of the ulcer is covered by slough (yellow, tan, gray, green or brown)  and/or eschar (tan, brown or black) in the wound bed. (Active)  Dressing Type Gauze (Comment);Moist to dry 08/12/2018 10:37 AM  Dressing Clean;Dry;Intact 08/12/2018 10:37 AM  Dressing Change Frequency Daily 08/12/2018 10:37 AM  State of Healing Eschar 08/12/2018 10:37 AM  Site / Wound Assessment Black;Yellow;Pink 08/12/2018 10:37 AM  % Wound base Red or Granulating 25% 08/12/2018 10:37 AM  % Wound base Yellow/Fibrinous Exudate 30% 08/12/2018 10:37 AM  % Wound base Black/Eschar 45% 08/12/2018 10:37 AM  Peri-wound Assessment Intact;Induration 08/12/2018 10:37 AM  Wound Length (cm) 8 cm 08/11/2018  1:36 PM  Wound Width (cm) 8.3 cm 08/11/2018  1:36 PM  Wound Depth (cm) 5 cm 08/11/2018  1:36 PM  Wound Surface Area (cm^2) 66.4 cm^2 08/11/2018  1:36 PM  Wound Volume (cm^3) 332 cm^3 08/11/2018  1:36 PM  Tunneling (cm) 0 08/11/2018  1:36 PM  Undermining (cm) 2.5 cm at 11:00 08/11/2018  1:36 PM  Margins Unattached edges (unapproximated) 08/12/2018 10:37 AM  Drainage Amount Moderate 08/12/2018 10:37 AM  Drainage Description Purulent 08/12/2018 10:37 AM  Treatment Debridement (Selective);Hydrotherapy (Pulse lavage);Packing (Saline gauze) 08/12/2018 10:37 AM  Santyl applied to wound bed prior to applying dressing.    Hydrotherapy Pulsed lavage therapy - wound location: Bilateral Ischial Tuberosities Pulsed Lavage with Suction (psi): 12 psi Pulsed Lavage with Suction - Normal Saline Used: 2000 mL Pulsed Lavage Tip: Tip with splash shield Selective Debridement Selective Debridement - Location: Bilateral Ischial Tuberosities Selective Debridement - Tools Used: Forceps;Scissors;Scalpel Selective Debridement - Tissue Removed: Yellow and black unviable tissue   Wound Assessment and Plan  Wound Therapy - Assess/Plan/Recommendations Wound Therapy - Clinical Statement: Necrotic  tissue loosening and able to debride more today. Pt will continue to benefit from hydrotherapy for selective removal of unviable tissue, decrease  bioburden, and promote wound bed healing.  Wound Therapy - Functional Problem List: Paraplegia Factors Delaying/Impairing Wound Healing: Altered sensation;Incontinence;Immobility Hydrotherapy Plan: Debridement;Dressing change;Patient/family education;Pulsatile lavage with suction Wound Therapy - Frequency: 6X / week Wound Therapy - Follow Up Recommendations: Other (comment)(PT recommending CIR) Wound Plan: See above  Wound Therapy Goals- Improve the function of patient's integumentary system by progressing the wound(s) through the phases of wound healing (inflammation - proliferation - remodeling) by: Decrease Necrotic Tissue to: 25% Decrease Necrotic Tissue - Progress: Progressing toward goal Increase Granulation Tissue to: 75% Increase Granulation Tissue - Progress: Progressing toward goal Improve Drainage Characteristics: Min;Serous Goals/treatment plan/discharge plan were made with and agreed upon by patient/family: Yes Time For Goal Achievement: 7 days Wound Therapy - Potential for Goals: Good  Goals will be updated until maximal potential achieved or discharge criteria met.  Discharge criteria: when goals achieved, discharge from hospital, MD decision/surgical intervention, no progress towards goals, refusal/missing three consecutive treatments without notification or medical reason.  GP     [name] 08/12/2018, 10:41 AM   [name], PT, DPT Acute Rehabilitation Services Pager: [phone] Office: [phone]

## 2018-08-12 NOTE — Progress Notes (Signed)
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 Pt able to maintain EOB with min A to control for posterior LOB. Mod to max A for bed mobility today. Cont to rec CIR.     Follow Up Recommendations  CIR     Equipment Recommendations  Other (comment)    Recommendations for Other Services Rehab consult     Precautions / Restrictions Precautions Precautions: Fall Precaution Comments: Watch for shear with transfers    Mobility  Bed Mobility Overal bed mobility: Needs Assistance Bed Mobility: Rolling;Sidelying to Sit;Sit to Sidelying Rolling: Mod assist;+2 for physical assistance Sidelying to sit: Mod assist;+2 for physical assistance     Sit to sidelying: Mod assist;+2 for physical assistance General bed mobility comments: asist with BLE legs over bed, patient pushing off with LUE from head of bed, as well as holding onto PT as we came to sitting from sidelying  Transfers Overall transfer level: Needs assistance   Transfers: Lateral/Scoot Transfers          Lateral/Scoot Transfers: +2 physical assistance;Mod assist General transfer comment: Simulated lateral scoot transfer, scooting to HOB, using bed pad to minimize shear  Ambulation/Gait                 Stairs             Wheelchair Mobility    Modified Rankin (Stroke Patients Only)       Balance Overall balance assessment: Needs assistance   Sitting balance-Leahy Scale: Poor Sitting balance - Comments: decreased core strength and stability to sit independently at EOB.                                      Cognition Arousal/Alertness: Awake/alert Behavior During Therapy: WFL for tasks assessed/performed Overall Cognitive Status: Within Functional Limits for tasks assessed                                        Exercises General Exercises - Upper Extremity Shoulder Horizontal ABduction: Strengthening;Both;10 reps;Seated;Theraband(2 sets each) Theraband Level (Shoulder Horizontal Abduction): Level 1 (Yellow) Shoulder Horizontal ADduction: Strengthening;Both;10 reps;Seated;Theraband Theraband Level (Shoulder Horizontal Adduction): Level 1 (Yellow)(2 sets each) Elbow Flexion: Strengthening;Both;10 reps;Theraband;Seated Theraband Level (Elbow Flexion): Level 1 (Yellow) Elbow Extension: Strengthening;10 reps;Seated;Both;Theraband Theraband Level (Elbow Extension): Level 1 (Yellow)    General Comments General comments (skin integrity, edema, etc.): Unable to perform full UE exercise while sitting up at EOB due to decreased core strength and stability.       Pertinent Vitals/Pain Pain Assessment: Faces Faces Pain Scale: Hurts little more Pain Location: Lt breast  Pain Descriptors / Indicators: Grimacing;Guarding;Throbbing Pain Intervention(s): Monitored during session;Repositioned    Home Living                      Prior Function            PT Goals (current goals can now be  found in the care plan section) Acute Rehab PT Goals Patient Stated Goal: to get wounds resolved, decrease pain in chest and return home  PT Goal Formulation: With patient Time For Goal Achievement: 08/24/18 Potential to Achieve Goals: Good Progress towards PT goals: Progressing toward goals    Frequency    Min 3X/week      PT Plan Current plan remains appropriate    Co-evaluation              AM-PAC PT "6 Clicks" Mobility   Outcome Measure  Help needed turning from your back to your side while in a flat bed  without using bedrails?: A Lot Help needed moving from lying on your back to sitting on the side of a flat bed without using bedrails?: A Lot Help needed moving to and from a bed to a chair (including a wheelchair)?: A Lot Help needed standing up from a chair using your arms (e.g., wheelchair or bedside chair)?: Total Help needed to walk in hospital room?: Total Help needed climbing 3-5 steps with a railing? : Total 6 Click Score: 9    End of Session Equipment Utilized During Treatment: Gait belt   Patient left: in bed;with call bell/phone within reach;Other (comment) Nurse Communication: Mobility status PT Visit Diagnosis: Other abnormalities of gait and mobility (R26.89);Muscle weakness (generalized) (M62.81);Other (comment)     Time: 3614-4315 PT Time Calculation (min) (ACUTE ONLY): 20 min  Charges:  $Therapeutic Activity: 8-22 mins                    Reinaldo Berber, PT, DPT Acute Rehabilitation Services Pager: [phone] Office: [phone]     Reinaldo Berber 08/12/2018, 4:03 PM

## 2018-08-12 NOTE — Progress Notes (Signed)
[facility] Progress Note     Subjective: CC: wounds Patient seen prior to hydrotherapy. Wounds improved some from yesterday but still have a fair amount of necrotic tissue in bases. Patient struggling with muscle spasms this AM.   Objective: Vital signs in last 24 hours: Temp:  [97.4 F (36.3 C)-98.5 F (36.9 C)] 97.6 F (36.4 C) (05/21 0920) Pulse Rate:  [53-75] 60 (05/21 0920) Resp:  [16-18] 18 (05/21 0920) BP: (111-132)/(71-85) 132/75 (05/21 0920) SpO2:  [97 %-100 %] 97 % (05/21 0523) Weight:  [101.7 kg] 101.7 kg (05/20 2111) Last BM Date: 08/11/18  Intake/Output from previous day: 05/20 0701 - 05/21 0700 In: 1726.2 [P.O.:440; I.V.:93; Blood:319.1; IV Piggyback:874] Out: 800 [Urine:800] Intake/Output this shift: No intake/output data recorded.  PE: Gen:  Alert, NAD Pulm:  Normal effort Skin: wounds as seen below, continuing to improve but still with a lot of necrotic tissue in the bases     Lab Results:  Recent Labs    08/11/18 0317  08/11/18 1805 08/12/18 0353  WBC 8.1  --   --  5.4  HGB 6.3*   < > 8.1* 7.8*  HCT 20.1*   < > 25.2* 24.0*  PLT 198  --   --  223   < > = values in this interval not displayed.   BMET Recent Labs    08/11/18 0317 08/12/18 0353  NA 137 139  K 4.0 4.2  CL 104 107  CO2 26 22  GLUCOSE 77 82  BUN 15 14  CREATININE 1.11* 1.12*  CALCIUM 7.5* 7.5*   PT/INR Recent Labs    08/09/18 2129  LABPROT 23.3*  INR 2.1*   CMP     Component Value Date/Time   NA 139 08/12/2018 0353   NA 139 04/08/2018 1503   NA 143 01/02/2017 1002   K 4.2 08/12/2018 0353   K 4.0 01/02/2017 1002   CL 107 08/12/2018 0353   CO2 22 08/12/2018 0353   CO2 25 01/02/2017 1002   GLUCOSE 82 08/12/2018 0353   GLUCOSE 87 01/02/2017 1002   BUN 14 08/12/2018 0353   BUN 13 04/08/2018 1503   BUN 11.9 01/02/2017 1002   CREATININE 1.12 (H) 08/12/2018 0353   CREATININE 1.09 (H) 07/21/2018 1004   CREATININE 0.9 01/02/2017 1002   CALCIUM 7.5  (L) 08/12/2018 0353   CALCIUM 7.2 (L) 09/15/2017 0635   CALCIUM 8.8 01/02/2017 1002   PROT 6.8 08/09/2018 2129   PROT 5.8 (L) 04/08/2018 1503   PROT 6.3 (L) 01/02/2017 1002   ALBUMIN 2.6 (L) 08/09/2018 2129   ALBUMIN 2.7 (L) 04/08/2018 1503   ALBUMIN 3.2 (L) 01/02/2017 1002   AST 29 08/09/2018 2129   AST 9 (L) 07/21/2018 1004   AST 24 01/02/2017 1002   ALT 24 08/09/2018 2129   ALT <6 07/21/2018 1004   ALT 12 01/02/2017 1002   ALKPHOS 387 (H) 08/09/2018 2129   ALKPHOS 126 01/02/2017 1002   BILITOT 1.0 08/09/2018 2129   BILITOT 0.5 07/21/2018 1004   BILITOT 1.06 01/02/2017 1002   GFRNONAA 56 (L) 08/12/2018 0353   GFRNONAA 58 (L) 07/21/2018 1004   GFRNONAA 71 06/07/2015 1523   GFRAA >60 08/12/2018 0353   GFRAA >60 07/21/2018 1004   GFRAA 82 06/07/2015 1523   Lipase     Component Value Date/Time   LIPASE 20 03/02/2018 1448       Studies/Results: No results found.  Anti-infectives: Anti-infectives (From admission, onward)   Start  Dose/Rate Route Frequency Ordered Stop   08/11/18 0200  vancomycin (VANCOCIN) 1,250 mg in sodium chloride 0.9 % 250 mL IVPB     1,250 mg 166.7 mL/hr over 90 Minutes Intravenous Every 24 hours 08/10/18 0148     08/10/18 0800  piperacillin-tazobactam (ZOSYN) IVPB 3.375 g     3.375 g 12.5 mL/hr over 240 Minutes Intravenous Every 8 hours 08/10/18 0315     08/10/18 0200  vancomycin (VANCOCIN) 2,000 mg in sodium chloride 0.9 % 500 mL IVPB     2,000 mg 250 mL/hr over 120 Minutes Intravenous  Once 08/10/18 0147 08/10/18 0523   08/10/18 0030  vancomycin (VANCOCIN) IVPB 1000 mg/200 mL premix  Status:  Discontinued     1,000 mg 200 mL/hr over 60 Minutes Intravenous  Once 08/10/18 0018 08/10/18 0147   08/10/18 0030  piperacillin-tazobactam (ZOSYN) IVPB 3.375 g     3.375 g 100 mL/hr over 30 Minutes Intravenous  Once 08/10/18 0018 08/10/18 0149       Assessment/Plan 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  May need more bedside debridement, but if improves with above measures then may not.  Doubt will need OR.  FEN - regular VTE - xarelto on hold ID - Vanc/Zosyn  LOS: 2 days    Brigid Re , [facility] 08/12/2018, 9:37 AM Pager: [phone] Consults: [phone]

## 2018-08-13 ENCOUNTER — Ambulatory Visit: Payer: Medicare Other | Admitting: Family Medicine

## 2018-08-13 ENCOUNTER — Inpatient Hospital Stay (HOSPITAL_COMMUNITY): Payer: Medicare Other

## 2018-08-13 DIAGNOSIS — K5909 Other constipation: Secondary | ICD-10-CM

## 2018-08-13 DIAGNOSIS — N3000 Acute cystitis without hematuria: Secondary | ICD-10-CM

## 2018-08-13 DIAGNOSIS — Z7901 Long term (current) use of anticoagulants: Secondary | ICD-10-CM

## 2018-08-13 LAB — CBC
HCT: 26.1 % — ABNORMAL LOW (ref 36.0–46.0)
Hemoglobin: 8.5 g/dL — ABNORMAL LOW (ref 12.0–15.0)
MCH: 28.8 pg (ref 26.0–34.0)
MCHC: 32.6 g/dL (ref 30.0–36.0)
MCV: 88.5 fL (ref 80.0–100.0)
Platelets: 257 10*3/uL (ref 150–400)
RBC: 2.95 MIL/uL — ABNORMAL LOW (ref 3.87–5.11)
RDW: 14.6 % (ref 11.5–15.5)
WBC: 4.9 10*3/uL (ref 4.0–10.5)
nRBC: 0 % (ref 0.0–0.2)

## 2018-08-13 LAB — BASIC METABOLIC PANEL
Anion gap: 12 (ref 5–15)
BUN: 9 mg/dL (ref 6–20)
CO2: 23 mmol/L (ref 22–32)
Calcium: 7.7 mg/dL — ABNORMAL LOW (ref 8.9–10.3)
Chloride: 105 mmol/L (ref 98–111)
Creatinine, Ser: 1.24 mg/dL — ABNORMAL HIGH (ref 0.44–1.00)
GFR calc Af Amer: 58 mL/min — ABNORMAL LOW (ref >60)
GFR calc non Af Amer: 50 mL/min — ABNORMAL LOW (ref >60)
Glucose, Bld: 75 mg/dL (ref 70–99)
Potassium: 3.9 mmol/L (ref 3.5–5.1)
Sodium: 140 mmol/L (ref 135–145)

## 2018-08-13 MED ORDER — PROCHLORPERAZINE EDISYLATE 10 MG/2ML IJ SOLN
5.0000 mg | Freq: Once | INTRAMUSCULAR | Status: AC
  Administered 2018-08-13: 5 mg via INTRAVENOUS
  Filled 2018-08-13: qty 2

## 2018-08-13 MED ORDER — SILVER NITRATE-POT NITRATE 75-25 % EX MISC
1.0000 "application " | Freq: Once | CUTANEOUS | Status: DC
  Filled 2018-08-13: qty 1

## 2018-08-13 MED ORDER — ONDANSETRON 4 MG PO TBDP
4.0000 mg | ORAL_TABLET | Freq: Three times a day (TID) | ORAL | Status: DC | PRN
  Administered 2018-08-14 – 2018-08-15 (×2): 4 mg via ORAL
  Filled 2018-08-13 (×2): qty 1

## 2018-08-13 MED ORDER — ONDANSETRON HCL 4 MG/2ML IJ SOLN
4.0000 mg | Freq: Once | INTRAMUSCULAR | Status: AC
  Administered 2018-08-13: 4 mg via INTRAVENOUS
  Filled 2018-08-13: qty 2

## 2018-08-13 NOTE — Progress Notes (Signed)
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  PTA assisted patient with slide board back to bed with +1 mod assistance.  Tech entered room and assisted with bed mobility.  Pt left in supine with CNA present.    Follow Up Recommendations  CIR     Equipment Recommendations  Other (comment)    Recommendations for Other Services Rehab consult     Precautions / Restrictions Precautions Precautions: Fall Precaution Comments: Watch for shear with transfers    Mobility  Bed Mobility Overal bed mobility: Needs Assistance Bed Mobility: Rolling;Sit to Sidelying Rolling: Min assist Sidelying to sit: Mod assist;+2 for physical assistance     Sit to sidelying: Mod assist;+2 for physical assistance General bed mobility comments: Pt required assistance to lower trunk and lift B LEs back into bed against gravity.    Transfers Overall transfer level: Needs assistance Equipment used: Sliding board Transfers: Lateral/Scoot Transfers          Lateral/Scoot Transfers: Mod assist General transfer comment: Pt able to scoot back to bed with mod +1 and use of bed pad.  PTA continued to give total assistance for set up and placement of board.    Ambulation/Gait                 Stairs             Wheelchair Mobility    Modified Rankin (Stroke Patients Only)       Balance     Sitting balance-Leahy Scale: Poor Sitting balance - Comments: decreased core strength and stability to sit independently at  EOB.                                     Cognition Arousal/Alertness: Awake/alert Behavior During Therapy: WFL for tasks assessed/performed Overall Cognitive Status: Within Functional Limits for tasks assessed                                        Exercises      General Comments        Pertinent Vitals/Pain Pain Assessment: Faces Faces Pain Scale: Hurts little more Pain Location: nausea Pain Descriptors / Indicators: Grimacing;Guarding;Throbbing Pain Intervention(s): Monitored during session;Repositioned    Home Living                      Prior Function            PT Goals (current goals can now be found in the care plan section) Acute Rehab PT Goals Patient Stated Goal: to get wounds resolved, decrease pain in chest and return home  Potential to Achieve Goals: Good Progress towards PT goals: Progressing toward goals    Frequency    Min 3X/week      PT Plan Current plan remains appropriate    Co-evaluation PT/OT/SLP Co-Evaluation/Treatment: Yes Reason for Co-Treatment:  Complexity of the patient's impairments (multi-system involvement) PT goals addressed during session: Mobility/safety with mobility OT goals addressed during session: ADL's and self-care      AM-PAC PT "6 Clicks" Mobility   Outcome Measure  Help needed turning from your back to your side while in a flat bed without using bedrails?: A Lot Help needed moving from lying on your back to sitting on the side of a flat bed without using bedrails?: A Lot Help needed moving to and from a bed to a chair (including a wheelchair)?: A Lot Help needed standing up from a chair using your arms (e.g., wheelchair or bedside chair)?: Total Help needed to walk in hospital room?: Total Help needed climbing 3-5 steps with a railing? : Total 6 Click Score: 9    End of Session Equipment Utilized During Treatment: Gait belt Activity Tolerance: Patient tolerated  treatment well Patient left: with call bell/phone within reach;Other (comment);in chair;with chair alarm set(in her power chair with alarm set) Nurse Communication: Mobility status PT Visit Diagnosis: Other abnormalities of gait and mobility (R26.89);Muscle weakness (generalized) (M62.81);Other (comment)     Time: 1351-1401 PT Time Calculation (min) (ACUTE ONLY): 10 min  Charges:  $Therapeutic Activity: 8-22 mins                     Governor Rooks, PTA Acute Rehabilitation Services Pager [phone] Office [place] 08/13/2018, 5:20 PM

## 2018-08-13 NOTE — Progress Notes (Addendum)
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 Seen for skilled therapy session with PT.  Pt. Able to complete bed mobility and transfer to powered w/chair with sliding board and 2 person assist. Focus on pt. Guiding how to assist her.   Follow Up Recommendations  Supervision/Assistance - 24 hour;CIR    Equipment Recommendations  None recommended by OT    Recommendations for Other Services      Precautions / Restrictions Precautions Precautions: Fall Precaution Comments: Watch for shear with transfers       Mobility Bed Mobility Overal bed mobility: Needs Assistance Bed Mobility: Rolling;Sidelying to Sit Rolling: Min assist Sidelying to sit: Mod assist;+2 for physical assistance       General bed mobility comments: pt. requests rolling to L, assisted with guiding b les in sidelying, pt. used B UEs on bed rail to assist with pulling self upright, initially very nervous about sitting eob, but then calmed down.    Transfers Overall transfer level: Needs assistance Equipment used: Sliding board Transfers: Lateral/Scoot Transfers          Lateral/Scoot Transfers: +2 physical assistance;Mod assist      Balance                                           ADL either performed or assessed with clinical judgement   ADL Overall ADL's : Needs assistance/impaired                                       General ADL Comments: able to provide instruction and guidance for how to assist her with transfers     Vision       Perception     Praxis      Cognition  Arousal/Alertness: Awake/alert Behavior During Therapy: [facility] for tasks assessed/performed Overall Cognitive Status: Within Functional Limits for tasks assessed                                          Exercises     Shoulder Instructions       General Comments      Pertinent Vitals/ Pain       Pain Location: nausea  Home Living                                          Prior Functioning/Environment              Frequency  Min 2X/week        Progress Toward Goals  OT Goals(current goals can now be found in the care plan section)  Progress towards OT goals: Progressing toward goals     Plan Discharge plan remains appropriate;Frequency remains appropriate    Co-evaluation     co-tx  with PT for safety during functional mobility and increasing independence with ADLS.            AM-PAC OT "6 Clicks" Daily Activity     Outcome Measure   Help from another person eating meals?: None Help from another person taking care of personal grooming?: None Help from another person toileting, which includes using toliet, bedpan, or urinal?: Total Help from another person bathing (including washing, rinsing, drying)?: A Lot Help from another person to put on and taking off regular upper body clothing?: A Lot Help from another person to put on and taking off regular lower body clothing?: Total 6 Click Score: 14    End of Session    OT Visit Diagnosis: Pain Pain - Right/Left: Left   Activity Tolerance Patient tolerated treatment well   Patient Left in chair;with call bell/phone within reach;with chair alarm set;Other (comment)(power wheel chair)   Nurse Communication          Time: [phone] OT Time Calculation (min): 23 min  Charges: OT General Charges $OT Visit: 1 Visit OT Treatments $Self Care/Home Management : 8-22 mins   Janice Coffin, COTA/L 08/13/2018, 12:52 PM

## 2018-08-13 NOTE — Progress Notes (Addendum)
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  She required total set up for transfers and required cues for hand placement.  Mod +2 to achieve lateral scoot with increase time and effort.  Pt reports feeling less nauseated sitting upright.    Follow Up Recommendations  CIR     Equipment Recommendations  Other (comment)    Recommendations for Other Services Rehab consult     Precautions / Restrictions Precautions Precautions: Fall Precaution Comments: Watch for shear with transfers    Mobility  Bed Mobility Overal bed mobility: Needs Assistance Bed Mobility: Rolling;Sidelying to Sit Rolling: Min assist Sidelying to sit: Mod assist;+2 for physical assistance       General bed mobility comments: pt. requests rolling to L, assisted with guiding b les in sidelying, pt. used B UEs on bed rail to assist with pulling self upright, initially very nervous about sitting eob, but then calmed down.    Transfers Overall transfer level: Needs assistance Equipment used: Sliding board Transfers: Lateral/Scoot Transfers          Lateral/Scoot Transfers: +2 physical assistance;Mod assist General transfer comment: Pt required total assistance for placement and set up of transfers.  He then required +2 mod to scoot with use of bed pad across slide board.    Ambulation/Gait                 Stairs             Wheelchair Mobility    Modified Rankin (Stroke Patients Only)       Balance     Sitting balance-Leahy Scale:  Poor Sitting balance - Comments: decreased core strength and stability to sit independently at EOB.                                     Cognition Arousal/Alertness: Awake/alert Behavior During Therapy: WFL for tasks assessed/performed Overall Cognitive Status: Within Functional Limits for tasks assessed                                        Exercises      General Comments        Pertinent Vitals/Pain Pain Assessment: Faces Faces Pain Scale: Hurts little more Pain Location: nausea Pain Descriptors / Indicators: Grimacing;Guarding;Throbbing Pain Intervention(s): Monitored during session;Repositioned    Home Living                      Prior Function            PT Goals (current goals can now be found in the care plan section) Acute Rehab PT Goals Patient Stated Goal: to get wounds resolved, decrease pain in chest and return home  Potential to Achieve Goals: Good Progress towards PT goals: Progressing toward goals    Frequency    Min 3X/week      PT Plan Current plan remains  appropriate    Co-evaluation PT/OT/SLP Co-Evaluation/Treatment: Yes Reason for Co-Treatment: Complexity of the patient's impairments (multi-system involvement) PT goals addressed during session: Mobility/safety with mobility OT goals addressed during session: ADL's and self-care      AM-PAC PT "6 Clicks" Mobility   Outcome Measure  Help needed turning from your back to your side while in a flat bed without using bedrails?: A Lot Help needed moving from lying on your back to sitting on the side of a flat bed without using bedrails?: A Lot Help needed moving to and from a bed to a chair (including a wheelchair)?: A Lot Help needed standing up from a chair using your arms (e.g., wheelchair or bedside chair)?: Total Help needed to walk in hospital room?: Total Help needed climbing 3-5 steps with a railing? : Total 6 Click Score: 9    End of  Session Equipment Utilized During Treatment: Gait belt Activity Tolerance: Patient tolerated treatment well Patient left: with call bell/phone within reach;Other (comment);in chair;with chair alarm set(in her power chair with alarm set) Nurse Communication: Mobility status PT Visit Diagnosis: Other abnormalities of gait and mobility (R26.89);Muscle weakness (generalized) (M62.81);Other (comment)     Time: 3810-1751 PT Time Calculation (min) (ACUTE ONLY): 23 min  Charges:  $Therapeutic Activity: 8-22 mins                     Governor Rooks, PTA Acute Rehabilitation Services Pager [phone] Office [place] 08/13/2018, 5:16 PM

## 2018-08-13 NOTE — Progress Notes (Addendum)
Notified Family med pager via [place] that pt was c/o nausea and was unable to take PO Phenergan, asked if she could have IV nausea med.    13- Notified family med pager via [place] that foley cath. Continues to leak and that pt continues to c/o nausea. Also, that pt has been unable to take any PO meds today due to being nauseous.    Paulla Fore, RN

## 2018-08-13 NOTE — Progress Notes (Addendum)
[facility] Progress Note     Subjective: CC: wounds Patient insensate in wounds. Tolerating dressing changes and hydro.   Objective: Vital signs in last 24 hours: Temp:  [98.3 F (36.8 C)] 98.3 F (36.8 C) (05/22 0902) Pulse Rate:  [56-65] 56 (05/22 0902) Resp:  [16-20] 16 (05/22 0902) BP: (123-154)/(66-79) 145/79 (05/22 0902) SpO2:  [99 %] 99 % (05/22 0902) Last BM Date: 08/11/18  Intake/Output from previous day: 05/21 0701 - 05/22 0700 In: 4050 [P.O.:740; I.V.:125.5; IV Piggyback:3184.5] Out: 3100 [Urine:3100] Intake/Output this shift: No intake/output data recorded.  PE: Gen:  Alert, NAD Pulm:  Normal effort Skin: wounds as seen below, continuing to improve but still with necrotic tissue in the bases     Lab Results:  Recent Labs    08/12/18 0353 08/13/18 0422  WBC 5.4 4.9  HGB 7.8* 8.5*  HCT 24.0* 26.1*  PLT 223 257   BMET Recent Labs    08/12/18 0353 08/13/18 0422  NA 139 140  K 4.2 3.9  CL 107 105  CO2 22 23  GLUCOSE 82 75  BUN 14 9  CREATININE 1.12* 1.24*  CALCIUM 7.5* 7.7*   PT/INR No results for input(s): LABPROT, INR in the last 72 hours. CMP     Component Value Date/Time   NA 140 08/13/2018 0422   NA 139 04/08/2018 1503   NA 143 01/02/2017 1002   K 3.9 08/13/2018 0422   K 4.0 01/02/2017 1002   CL 105 08/13/2018 0422   CO2 23 08/13/2018 0422   CO2 25 01/02/2017 1002   GLUCOSE 75 08/13/2018 0422   GLUCOSE 87 01/02/2017 1002   BUN 9 08/13/2018 0422   BUN 13 04/08/2018 1503   BUN 11.9 01/02/2017 1002   CREATININE 1.24 (H) 08/13/2018 0422   CREATININE 1.09 (H) 07/21/2018 1004   CREATININE 0.9 01/02/2017 1002   CALCIUM 7.7 (L) 08/13/2018 0422   CALCIUM 7.2 (L) 09/15/2017 0635   CALCIUM 8.8 01/02/2017 1002   PROT 6.8 08/09/2018 2129   PROT 5.8 (L) 04/08/2018 1503   PROT 6.3 (L) 01/02/2017 1002   ALBUMIN 2.6 (L) 08/09/2018 2129   ALBUMIN 2.7 (L) 04/08/2018 1503   ALBUMIN 3.2 (L) 01/02/2017 1002   AST 29  08/09/2018 2129   AST 9 (L) 07/21/2018 1004   AST 24 01/02/2017 1002   ALT 24 08/09/2018 2129   ALT <6 07/21/2018 1004   ALT 12 01/02/2017 1002   ALKPHOS 387 (H) 08/09/2018 2129   ALKPHOS 126 01/02/2017 1002   BILITOT 1.0 08/09/2018 2129   BILITOT 0.5 07/21/2018 1004   BILITOT 1.06 01/02/2017 1002   GFRNONAA 50 (L) 08/13/2018 0422   GFRNONAA 58 (L) 07/21/2018 1004   GFRNONAA 71 06/07/2015 1523   GFRAA 58 (L) 08/13/2018 0422   GFRAA >60 07/21/2018 1004   GFRAA 82 06/07/2015 1523   Lipase     Component Value Date/Time   LIPASE 20 03/02/2018 1448       Studies/Results: No results found.  Anti-infectives: Anti-infectives (From admission, onward)   Start     Dose/Rate Route Frequency Ordered Stop   08/12/18 1400  ceFEPIme (MAXIPIME) 2 g in sodium chloride 0.9 % 100 mL IVPB     2 g 200 mL/hr over 30 Minutes Intravenous Every 8 hours 08/12/18 1141     08/12/18 1145  metroNIDAZOLE (FLAGYL) IVPB 500 mg     500 mg 100 mL/hr over 60 Minutes Intravenous Every 8 hours 08/12/18 1141  08/11/18 0200  vancomycin (VANCOCIN) 1,250 mg in sodium chloride 0.9 % 250 mL IVPB     1,250 mg 166.7 mL/hr over 90 Minutes Intravenous Every 24 hours 08/10/18 0148     08/10/18 0800  piperacillin-tazobactam (ZOSYN) IVPB 3.375 g  Status:  Discontinued     3.375 g 12.5 mL/hr over 240 Minutes Intravenous Every 8 hours 08/10/18 0315 08/12/18 1141   08/10/18 0200  vancomycin (VANCOCIN) 2,000 mg in sodium chloride 0.9 % 500 mL IVPB     2,000 mg 250 mL/hr over 120 Minutes Intravenous  Once 08/10/18 0147 08/10/18 0523   08/10/18 0030  vancomycin (VANCOCIN) IVPB 1000 mg/200 mL premix  Status:  Discontinued     1,000 mg 200 mL/hr over 60 Minutes Intravenous  Once 08/10/18 0018 08/10/18 0147   08/10/18 0030  piperacillin-tazobactam (ZOSYN) IVPB 3.375 g     3.375 g 100 mL/hr over 30 Minutes Intravenous  Once 08/10/18 0018 08/10/18 0149       Assessment/Plan Bilateral stage IV inferior gluteal  decubitus ulcers -continue PT hydrotherapy  -cont BID dressing changes and santyl therapy -will follow. May need more bedside debridement, but if improves with above measures then may not. Doubt will need OR.  FEN -regular VTE -xarelto on hold ID -Vanc/Zosyn  We will not see over holiday weekend unless called for concern. Otherwise will follow up on Tuesday.   LOS: 3 days    Brigid Re , [facility] 08/13/2018, 10:25 AM Pager: [phone] Consults: [phone]

## 2018-08-13 NOTE — Care Management Important Message (Signed)
Important Message  Patient Details  Name: Sarah [name] MRN: [id number] Date of Birth: [birth year]   Medicare Important Message Given:  Yes    Orbie Pyo 08/13/2018, 3:05 PM

## 2018-08-13 NOTE — Progress Notes (Signed)
FPTS Interim Progress Note  Went to see patient after page concerning persistent nausea. Patient asleep when entering room, however endorsed severe nausea during encounter. Abdomen soft but mildly tender when examined by attending Dr. Owens Shark. Patient notes she thinks it is because she is constipated. Patient endorses metallic taste in mouth as well, which is likely from Flagyl. KUB consistent with moderate stool burden, without obstruction or free air. EKG reviewed without any concerning findings. QTc only slightly elevated. Given no improvement with any antiemetics given thus far, will order IV zofran. Plan to transition off of Flagyl tomorrow which will likely improve symptoms as well. Will also give another glycerin suppository to help with stool burden.  Also received page about foley catheter leaking. Given patient in incontinent with sacral wounds, area needs to remain dry and clean. Thus, will have foley catheter replaced. Can consider pure wick if foley continues to be troublesome.   Mina Marble [place], DO 08/13/2018, 5:25 PM PGY-1, [place] Medicine Service pager [phone]

## 2018-08-13 NOTE — Progress Notes (Signed)
Pharmacy Antibiotic Note  Sarah [name] is a 53 y.o. female admitted on 08/09/2018 with large decubitus ulcers secondary to paraplegia.  Pharmacy has been consulted for vancomycin dosing.  Plan: Continue vancomycin 1,250 mg IV q24h Check vancomycin levels PRN Cefepime 2 g IV q8h Metronidazole 500 mg IV q8h  Height: 5\' 11"  (180.3 cm) Weight: 224 lb 3.3 oz (101.7 kg) IBW/kg (Calculated) : 70.8  Temp (24hrs), Avg:98 F (36.7 C), Min:97.6 F (36.4 C), Max:98.3 F (36.8 C)  Recent Labs  Lab 08/09/18 2129 08/10/18 0414 08/11/18 0317 08/12/18 0353 08/13/18 0422  WBC 6.3 16.8* 8.1 5.4 4.9  CREATININE 1.15* 1.23* 1.11* 1.12* 1.24*  LATICACIDVEN 1.9  --   --   --   --     Estimated Creatinine Clearance: 69.7 mL/min (A) (by C-G formula based on SCr of 1.24 mg/dL (H)).    Allergies  Allergen Reactions  . Bee Venom Anaphylaxis  . Heparin Other (See Comments)    Weak positive platelets induced antibodies, SRA negative--2015  . Keflex [Cephalexin] Nausea And Vomiting  . Tramadol Nausea And Vomiting  . Gabapentin Nausea And Vomiting  . Hydrochlorothiazide Nausea And Vomiting  . Hydrocodone Nausea And Vomiting  . Oxycodone-Acetaminophen Nausea And Vomiting  . Sorbitol Nausea Only    And abdominal distress  . Tizanidine     Fatigue     Antimicrobials this admission: Vancomycin 5/19 >> Zosyn 5/19 >> 5/21 Cefepime 5/21 >> Flagyl 5/21 >>  Dose adjustments this admission: None  Microbiology results: 5/18 BCx: GPC - could be coag negative staph, pending 5/19 COVID: negative 5/19 BCx: ngtd 5/19 MRSA PCR: negative 5/19 COVID: negative 5/19 UCx: >100k GNR 5/21 BCx: sent  Thank you for allowing pharmacy to be a part of this patient's care.  Vertis Kelch, PharmD PGY1 Pharmacy Resident Phone [phone] 08/13/2018       8:38 AM

## 2018-08-13 NOTE — Progress Notes (Signed)
Physical Therapy Wound Treatment Patient Details  Name: Sarah [name] MRN: [id number] Date of Birth: March 27, 1965  Today's Date: 08/13/2018 Time: 0539-7673 Time Calculation (min): 54 min  Subjective  Subjective: Pleasant and agreeable to hydrotherapy Patient and Family Stated Goals: Heal wounds Date of Onset: (unknown) Prior Treatments: Bedside I&D  Pain Score: Pain Score: 0-No pain  Wound Assessment  Pressure Injury 08/10/18 Unstageable - Full thickness tissue loss in which the base of the ulcer is covered by slough (yellow, tan, gray, green or brown) and/or eschar (tan, brown or black) in the wound bed. (Active)  Dressing Type ABD;Barrier Film (skin prep);Moist to dry;Tape dressing 08/13/2018  2:24 PM  Dressing Clean;Dry;Intact 08/13/2018  2:24 PM  Dressing Change Frequency Daily 08/13/2018  2:24 PM  State of Healing Early/partial granulation 08/13/2018  2:24 PM  Site / Wound Assessment Black;Dusky;Granulation tissue;Yellow 08/13/2018  2:24 PM  % Wound base Red or Granulating 30% 08/13/2018  2:24 PM  % Wound base Yellow/Fibrinous Exudate 30% 08/13/2018  2:24 PM  % Wound base Black/Eschar 40% 08/13/2018  2:24 PM  Peri-wound Assessment Intact;Induration 08/13/2018  2:24 PM  Wound Length (cm) 3.7 cm 08/11/2018  1:36 PM  Wound Width (cm) 4.1 cm 08/11/2018  1:36 PM  Wound Depth (cm) 2.1 cm 08/11/2018  1:36 PM  Wound Surface Area (cm^2) 15.17 cm^2 08/11/2018  1:36 PM  Wound Volume (cm^3) 31.86 cm^3 08/11/2018  1:36 PM  Tunneling (cm) 0 08/11/2018  1:36 PM  Undermining (cm) 0 08/11/2018  1:36 PM  Margins Unattached edges (unapproximated) 08/13/2018  2:24 PM  Drainage Amount Moderate 08/13/2018  2:24 PM  Drainage Description Serosanguineous;Purulent 08/13/2018  2:24 PM  Treatment Debridement (Selective);Hydrotherapy (Pulse lavage);Packing (Saline gauze) 08/13/2018  2:24 PM     Pressure Injury 08/10/18 Unstageable - Full thickness tissue loss in which the base of the ulcer is covered by slough (yellow, tan,  gray, green or brown) and/or eschar (tan, brown or black) in the wound bed. (Active)  Dressing Type ABD;Barrier Film (skin prep);Moist to dry;Tape dressing 08/13/2018  2:24 PM  Dressing Clean;Dry;Intact 08/13/2018  2:24 PM  Dressing Change Frequency Daily 08/13/2018  2:24 PM  State of Healing Eschar 08/13/2018  2:24 PM  Site / Wound Assessment Black;Pink;Yellow 08/13/2018  2:24 PM  % Wound base Red or Granulating 25% 08/13/2018  2:24 PM  % Wound base Yellow/Fibrinous Exudate 30% 08/13/2018  2:24 PM  % Wound base Black/Eschar 45% 08/13/2018  2:24 PM  Peri-wound Assessment Intact;Induration 08/13/2018  2:24 PM  Wound Length (cm) 8 cm 08/11/2018  1:36 PM  Wound Width (cm) 8.3 cm 08/11/2018  1:36 PM  Wound Depth (cm) 5 cm 08/11/2018  1:36 PM  Wound Surface Area (cm^2) 66.4 cm^2 08/11/2018  1:36 PM  Wound Volume (cm^3) 332 cm^3 08/11/2018  1:36 PM  Tunneling (cm) 0 08/11/2018  1:36 PM  Undermining (cm) 2.5 cm at 11:00 08/11/2018  1:36 PM  Margins Unattached edges (unapproximated) 08/13/2018  2:24 PM  Drainage Amount Moderate 08/13/2018  2:24 PM  Drainage Description Serosanguineous;Purulent 08/13/2018  2:24 PM  Treatment Debridement (Selective);Hydrotherapy (Pulse lavage);Packing (Saline gauze) 08/13/2018  2:24 PM     Santyl applied to wound bed prior to applying dressing.   Wound Assessment and Plan  Wound Therapy - Assess/Plan/Recommendations Wound Therapy - Clinical Statement: Pt rolled to her L side to get a different view on the wound. PA Claiborne Billings) came into to assess room and performed some of the debridement.  Pt is progressingly slowly but continues to  benefit from hydro therapy and selective debridement to remove viable tissue and decrease bioburden.  This will allow for healing of the wound bed.  Ischial wound on L hip oozing slowly after debridement and silver nitrate used stop the bleeding.   Wound Therapy - Functional Problem List: Paraplegia Factors Delaying/Impairing Wound Healing: Altered  sensation;Incontinence;Immobility Hydrotherapy Plan: Debridement;Dressing change;Patient/family education;Pulsatile lavage with suction Wound Therapy - Frequency: 6X / week Wound Therapy - Follow Up Recommendations: Other (comment)(PT recommending CIR) Wound Plan: See above  Wound Therapy Goals- Improve the function of patient's integumentary system by progressing the wound(s) through the phases of wound healing (inflammation - proliferation - remodeling) by:    Goals will be updated until maximal potential achieved or discharge criteria met.  Discharge criteria: when goals achieved, discharge from hospital, MD decision/surgical intervention, no progress towards goals, refusal/missing three consecutive treatments without notification or medical reason.  GP     Sarah [name] 08/13/2018, 2:38 PM  Sarah [name], PTA Acute Rehabilitation Services Pager [phone] Office [phone]

## 2018-08-13 NOTE — Progress Notes (Addendum)
Family Medicine Teaching Service Daily Progress Note Intern Pager: [phone]  Patient name: Sarah [name] Medical record number: [id number] Date of birth: 01-29-1966 Age: 53 y.o. Gender: female  Primary Care Provider: [name], MD Consultants: General Surgery Code Status: Full  Pt Overview and Major Events to Date:  5/19 Admitted FPTS, wound debridement  Assessment and Plan: Sarah [name] is a 53 y.o. female presenting with worsening of gluteal wound and fever concerning for bacterial wound infection . PMH is significant for paraplegia, metastatic cancer,  GERD, depression,   Fever 2/2 b/l stage IV inferior gluteal decub ulcers Febrile to 102.5 in ED, has remained afebrile since that time.  WBC WNL.  Surgery consulted due to CT abdomen pelvis showing decubitus when containing gas.  Debridement performed by general surgery 5/19, may need more per surgery note.  Patient remains on vancomycin, cefepime, flagyl.  Sarah [name] d/c'ed on 5/21.  Initial Blood Cx on 5/18 at 2129 NG4D aerobic tube, with anaerobic bottle GPC, and Blood Cx on 5/19 at 0117 is NG3D.  Urine culture >100,000 colonies klebsiella, susceptibilities pending.  Repeat blood cx drawn 5/21 pending.  Patient having some nausea this AM and retching.   - s/p Sarah [name] (5/19-5/21)  - cont Sarah [name] (5/19- ) - cont cefepime and flagyl (5/21- ) - pending urine susceptibilities, will likely switch to agent to cover urine and doxycycline for wounds -General surgery consulted, appreciate recommendations - wound care consult - f/u blood and urine cultures - f/u repeat blood cx - I/O qshift - given compazine x1  Paraplegia 2/2 metastatic Sarah [name] - thyroidectomy in 2015.  Recent craniotomy in march.  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  She is incontinent of bowel and bladder. She takes baclofen for muscle spasms and synthroid for postop hypothyroidism.  - continue baclofen - continue synthroid - PT/OT: CIR, have been  consulted - wound care consult  Normocytic Anemia  Has had previous transfusions and GI evaluation that included FOBT during 02/2018 that was negative, did not receive scope at that time due to comorbidities.  Patient on Sarah [name] chronically, currently on hold.  Transfusion threshold 7. Prior iron panel has suggested IDA with low iron, although low TIBC, but also low sat.  Hgb 8.5 this AM. - monitor CBC - s/p 1 unit pRBC - ferrous sulfate QOD - outpatient GI f/u  Constipation  Nausea Large stool ball on CT abd/pelvis.  Patient states that she takes miralax and senna at home and usually has enemas while inpatient.  Patient has had numerous bowel movements since that time and received a tap water enema with good output.  Started Daily Glycerin suppositories on 5/21.  Could be source of nausea, will order KUB to assess stool burden. - KUB to assess stool burden, patient also felt "click in right hip" which will likely also see on KUB -daily glycerin suppository -MiraLAX and senna - obtain EKG to rule out ACS causing nausea, although very unlikely  History of DVT w/ PE - patient is on Sarah [name] long term for hx of PE.  No current increased risk of bleeding.   -Holding Sarah [name] per surgery recs  Vaginal candidiasis - multiple recent office visits for vaginal candidiasis and candidal intertrigo.  She has been using nystatin powder. On initial external vaginal exam, did not appreciate any erythema or signs of candidiasis.   - holding nystatin powder.   GERD - takes pantoprazole 40mg  at home.   - continue home meds  Depression - takes  zoloft 50mg  at home.  - continue home meds  Hypothyroidism - 2/2 thyroid cancer and thyroidectomy.  Takes 140mcg daily at home - continue home meds.   Seasonal allergies - patient takes flonase and zyrtec at home.  - continue flonase - claritin on forumulary  FEN/GI: Regular PPx: SCDs, while holding Sarah [name]  Disposition: CIR pending cultures and  Antibiotic changes  Subjective:  Patient states that prior to this morning, she is feeling well.  This morning she is having some nausea.  She has no other complaints.  States that she has been having good stool output.  Objective: Temp:  [97.6 F (36.4 C)-98.3 F (36.8 C)] 98.3 F (36.8 C) (05/21 2050) Pulse Rate:  [56-65] 65 (05/22 0620) Resp:  [18-20] 18 (05/22 0620) BP: (123-154)/(66-76) 154/75 (05/22 0620) SpO2:  [99 %] 99 % (05/22 9983)  Physical Exam:  General: 53 y.o. female in NAD Cardio: RRR no m/r/g Lungs: CTAB, no wheezing, no rhonchi, no crackles, no IWOB on RA Abdomen: Soft, non-tender to palpation, non-distended, positive bowel sounds Skin: warm and dry, chronic gluteal wounds managed by surgery Extremities: No edema    Laboratory: Recent Labs  Lab 08/11/18 0317  08/11/18 1805 08/12/18 0353 08/13/18 0422  WBC 8.1  --   --  5.4 4.9  HGB 6.3*   < > 8.1* 7.8* 8.5*  HCT 20.1*   < > 25.2* 24.0* 26.1*  PLT 198  --   --  223 257   < > = values in this interval not displayed.   Recent Labs  Lab 08/09/18 2129  08/11/18 0317 08/12/18 0353 08/13/18 0422  NA 135   < > 137 139 140  K 4.1   < > 4.0 4.2 3.9  CL 98   < > 104 107 105  CO2 25   < > 26 22 23   BUN 17   < > 15 14 9   CREATININE 1.15*   < > 1.11* 1.12* 1.24*  CALCIUM 8.3*   < > 7.5* 7.5* 7.7*  PROT 6.8  --   --   --   --   BILITOT 1.0  --   --   --   --   ALKPHOS 387*  --   --   --   --   ALT 24  --   --   --   --   AST 29  --   --   --   --   GLUCOSE 145*   < > 77 82 75   < > = values in this interval not displayed.    Imaging/Diagnostic Tests: No results found.  [place], DO 08/13/2018, 8:04 AM PGY-1, [place] Intern pager: [phone], text pages welcome

## 2018-08-14 DIAGNOSIS — L89309 Pressure ulcer of unspecified buttock, unspecified stage: Secondary | ICD-10-CM

## 2018-08-14 LAB — URINE CULTURE: Culture: >=100000 — AB

## 2018-08-14 LAB — BASIC METABOLIC PANEL
Anion gap: 16 — ABNORMAL HIGH (ref 5–15)
BUN: 13 mg/dL (ref 6–20)
CO2: 19 mmol/L — ABNORMAL LOW (ref 22–32)
Calcium: 7.9 mg/dL — ABNORMAL LOW (ref 8.9–10.3)
Chloride: 106 mmol/L (ref 98–111)
Creatinine, Ser: 1.18 mg/dL — ABNORMAL HIGH (ref 0.44–1.00)
GFR calc Af Amer: >60 mL/min (ref >60)
GFR calc non Af Amer: 53 mL/min — ABNORMAL LOW (ref >60)
Glucose, Bld: 74 mg/dL (ref 70–99)
Potassium: 4.5 mmol/L (ref 3.5–5.1)
Sodium: 141 mmol/L (ref 135–145)

## 2018-08-14 LAB — CBC
HCT: 27.5 % — ABNORMAL LOW (ref 36.0–46.0)
Hemoglobin: 8.7 g/dL — ABNORMAL LOW (ref 12.0–15.0)
MCH: 29.1 pg (ref 26.0–34.0)
MCHC: 31.6 g/dL (ref 30.0–36.0)
MCV: 92 fL (ref 80.0–100.0)
Platelets: 258 10*3/uL (ref 150–400)
RBC: 2.99 MIL/uL — ABNORMAL LOW (ref 3.87–5.11)
RDW: 14.6 % (ref 11.5–15.5)
WBC: 6.2 10*3/uL (ref 4.0–10.5)
nRBC: 0.3 % — ABNORMAL HIGH (ref 0.0–0.2)

## 2018-08-14 LAB — CULTURE, BLOOD (ROUTINE X 2)

## 2018-08-14 MED ORDER — METRONIDAZOLE IN NACL 5-0.79 MG/ML-% IV SOLN
500.0000 mg | Freq: Three times a day (TID) | INTRAVENOUS | Status: DC
  Administered 2018-08-14 – 2018-08-15 (×4): 500 mg via INTRAVENOUS
  Filled 2018-08-14 (×4): qty 100

## 2018-08-14 NOTE — Progress Notes (Addendum)
Family Medicine Teaching Service Daily Progress Note Intern Pager: [phone]  Patient name: Sarah [name] Medical record number: [id number] Date of birth: [birth year] Age: 53 y.o. Gender: female  Primary Care Provider: [name], MD Consultants: General Surgery Code Status: Full  Pt Overview and Major Events to Date:  5/19 Admitted FPTS, wound debridement  Assessment and Plan: Sarah [name] is a 53 y.o. female presenting with worsening of gluteal wound and fever concerning for bacterial wound infection . PMH is significant for paraplegia, metastatic cancer,  GERD, depression,   Fever 2/2 b/l stage IV inferior gluteal decub ulcers Febrile to 102.5 in ED, has remained afebrile since that time.  WBC WNL.  S/P Debridement 5/19 by surgery.  Patient remains on vancomycin, cefepime, flagyl.  Zosyn d/c'ed on 5/21.  Initial Blood Cx on 5/18 with anaerobic bottle GPC, and Blood Cx on 5/19 at 0117 is NG4D.  Urine culture >100,000 colonies klebsiella, susceptibilities with resistance to ampicillin and macrobid.  Repeat blood cx drawn 5/21 NG1D.  Patient complaining of metallic taste on 1/60 likely 2/2 flagyl and also with nausea that could be from constipation vs antibiotics. Plan to transition to PO antibiotics tomorrow, likely Keflex for UTI and likely clindamycin for clostridium coverage, spoke with pharmacy who also agree with clindamycin for clostridium coverage. Also plan to restart Xarelto 5/24. 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  Recent craniotomy in march.  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  She is incontinent of bowel and bladder. She takes baclofen for muscle spasms and synthroid for postop hypothyroidism.  - continue baclofen - continue synthroid - PT/OT: CIR, have been consulted - wound care consult  Normocytic Anemia  Has had previous transfusions and GI evaluation that included FOBT during 02/2018 that was negative, did not receive scope at that time due to comorbidities.  Patient on Xarelto chronically, currently on hold.  Transfusion threshold 7. Prior iron panel has suggested IDA with low iron, although low TIBC, but also low sat.  Hgb stable at 8.7 this AM. - monitor CBC - s/p 1 unit pRBC - ferrous sulfate QOD - outpatient GI f/u  Constipation  Nausea Large stool ball on CT abd/pelvis.  Patient states that she takes miralax and senna at home and usually has enemas while inpatient.  Patient with worsening nausea yesterday, KUB showed rectum mildly distended with stool, elsewhere modest stool, no obstruction or free air.  EKG sinus brady to 57 and QTc 480.  Received 1 dose IV zofran yesterday and glycerin suppository.  This AM notes that nausea is much improved. 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  No current increased risk of bleeding.   -Holding Xarelto per surgery recs, plan to restart on 5/24  Vaginal candidiasis - multiple recent office visits for vaginal candidiasis and candidal intertrigo.  She has been using nystatin powder. On initial external vaginal exam, did not appreciate any erythema or signs of candidiasis.   - holding nystatin powder.  GERD - takes pantoprazole 40mg  at home.   - continue home meds  Depression - takes zoloft 50mg  at home.  - continue home meds  Hypothyroidism - 2/2 thyroid cancer and thyroidectomy.  Takes 150mcg daily at home - continue home meds.   Seasonal allergies - patient takes flonase and zyrtec at  home.  - continue flonase - claritin on forumulary  FEN/GI: Regular PPx: SCDs, while holding Xarelto  Disposition: CIR pending change in antibiotics and observation of continued stability on PO abx  Subjective:  Patient states that she is feeling better this AM and does not have nausea.  No other complaints.  Objective: Temp:  [97.5 F (36.4 C)-98.3 F (36.8 C)] 98.2 F (36.8 C) (05/23 0500) Pulse Rate:  [56-64] 64 (05/23 0500) Resp:  [16-18] 18 (05/23 0500) BP: (89-145)/(50-79) 89/50 (05/23 0500) SpO2:  [99 %-100 %] 100 % (05/23 0500)  Physical Exam:  General: 53 y.o. female in NAD HEENT: left TM intact, no erythema Cardio: RRR no m/r/g Lungs: CTAB, no wheezing, no rhonchi, no crackles, no IWOB on RA Abdomen: Soft, non-tender to palpation, non-distended, positive bowel sounds Skin: warm and dry, gluteal pressure wounds seen while wound care occurring, no necrotic tissue noted, appear unchanged from yesterday's images on surgery note Extremities: No edema, prevalon boots bilaterally    Laboratory: Recent Labs  Lab 08/12/18 0353 08/13/18 0422 08/14/18 0510  WBC 5.4 4.9 6.2  HGB 7.8* 8.5* 8.7*  HCT 24.0* 26.1* 27.5*  PLT 223 257 258   Recent Labs  Lab 08/09/18 2129  08/12/18 0353 08/13/18 0422 08/14/18 0510  NA 135   < > 139 140 141  K 4.1   < > 4.2 3.9 4.5  CL 98   < > 107 105 106  CO2 25   < > 22 23 19*  BUN 17   < > 14 9 13   CREATININE 1.15*   < > 1.12* 1.24* 1.18*  CALCIUM 8.3*   < > 7.5* 7.7* 7.9*  PROT 6.8  --   --   --   --   BILITOT 1.0  --   --   --   --   ALKPHOS 387*  --   --   --   --   ALT 24  --   --   --   --   AST 29  --   --   --   --   GLUCOSE 145*   < > 82 75 74   < > = values in this interval not displayed.    Imaging/Diagnostic Tests: Dg Abd Portable 1v  Result Date: 08/13/2018 CLINICAL DATA:  Constipation EXAM: PORTABLE ABDOMEN - 1 VIEW COMPARISON:  CT abdomen and pelvis Aug 10, 2018 FINDINGS: The rectum appears mildly distended  with stool. Elsewhere, there is only fairly modest stool throughout colon. There is no appreciable bowel dilatation or air-fluid level to suggest bowel obstruction. No free air. There is postoperative change in the left proximal femur, incompletely visualized. There is degenerative change throughout the lumbar spine and hip regions. IMPRESSION: Rectum mildly distended with stool. Elsewhere only modest stool seen. No bowel obstruction or free air demonstrable. Electronically Signed   By: Lowella Grip III M.D.   On: 08/13/2018 17:03    Maroa, Bernita Raisin, DO 08/14/2018, 8:07 AM PGY-1, [place] Intern pager: [phone], text pages welcome

## 2018-08-14 NOTE — Progress Notes (Signed)
Physical Therapy Wound Treatment Patient Details  Name: Sarah [name] MRN: [id number] Date of Birth: [birth year]  Today's Date: 08/14/2018 Time: 2353-6144 Time Calculation (min): 46 min  Subjective  Subjective: Pleasant and agreeable to hydrotherapy Patient and Family Stated Goals: Heal wounds Date of Onset: (unknown) Prior Treatments: Bedside I&D  Pain Score:  Pt premedicated and did not report any pain throughout session.   Wound Assessment  Pressure Injury 08/10/18 Unstageable - Full thickness tissue loss in which the base of the ulcer is covered by slough (yellow, tan, gray, green or brown) and/or eschar (tan, brown or black) in the wound bed. (Active)  Dressing Type ABD;Barrier Film (skin prep);Moist to dry;Tape dressing 08/14/2018 10:19 AM  Dressing Changed 08/14/2018 10:19 AM  Dressing Change Frequency Daily 08/14/2018 10:19 AM  State of Healing Early/partial granulation 08/14/2018 10:19 AM  Site / Wound Assessment Black;Dusky;Granulation tissue;Yellow 08/14/2018 10:19 AM  % Wound base Red or Granulating 30% 08/14/2018 10:19 AM  % Wound base Yellow/Fibrinous Exudate 45% 08/14/2018 10:19 AM  % Wound base Black/Eschar 25% 08/14/2018 10:19 AM  Peri-wound Assessment Intact;Induration 08/14/2018 10:19 AM  Wound Length (cm) 3.7 cm 08/11/2018  1:36 PM  Wound Width (cm) 4.1 cm 08/11/2018  1:36 PM  Wound Depth (cm) 2.1 cm 08/11/2018  1:36 PM  Wound Surface Area (cm^2) 15.17 cm^2 08/11/2018  1:36 PM  Wound Volume (cm^3) 31.86 cm^3 08/11/2018  1:36 PM  Tunneling (cm) 0 08/11/2018  1:36 PM  Undermining (cm) 0 08/11/2018  1:36 PM  Margins Unattached edges (unapproximated) 08/14/2018 10:19 AM  Drainage Amount Moderate 08/14/2018 10:19 AM  Drainage Description Serosanguineous 08/14/2018 10:19 AM  Treatment Debridement (Selective);Hydrotherapy (Pulse lavage);Packing (Saline gauze) 08/14/2018 10:19 AM   Santyl applied to wound bed prior to applying dressing.    Pressure Injury 08/10/18 Unstageable - Full  thickness tissue loss in which the base of the ulcer is covered by slough (yellow, tan, gray, green or brown) and/or eschar (tan, brown or black) in the wound bed. (Active)  Dressing Type ABD;Barrier Film (skin prep);Moist to dry;Tape dressing 08/14/2018 10:19 AM  Dressing Changed 08/14/2018 10:19 AM  Dressing Change Frequency Daily 08/14/2018 10:19 AM  State of Healing Eschar 08/14/2018 10:19 AM  Site / Wound Assessment Black;Pink;Yellow 08/14/2018 10:19 AM  % Wound base Red or Granulating 25% 08/14/2018 10:19 AM  % Wound base Yellow/Fibrinous Exudate 30% 08/14/2018 10:19 AM  % Wound base Black/Eschar 45% 08/14/2018 10:19 AM  Peri-wound Assessment Intact;Induration 08/14/2018 10:19 AM  Wound Length (cm) 8 cm 08/11/2018  1:36 PM  Wound Width (cm) 8.3 cm 08/11/2018  1:36 PM  Wound Depth (cm) 5 cm 08/11/2018  1:36 PM  Wound Surface Area (cm^2) 66.4 cm^2 08/11/2018  1:36 PM  Wound Volume (cm^3) 332 cm^3 08/11/2018  1:36 PM  Tunneling (cm) 0 08/11/2018  1:36 PM  Undermining (cm) 2.5 cm at 11:00 08/11/2018  1:36 PM  Margins Unattached edges (unapproximated) 08/14/2018 10:19 AM  Drainage Amount Moderate 08/14/2018 10:19 AM  Drainage Description Serosanguineous 08/14/2018 10:19 AM  Treatment Debridement (Selective);Hydrotherapy (Pulse lavage);Packing (Saline gauze) 08/14/2018 10:19 AM   Santyl applied to wound bed prior to applying dressing.     Hydrotherapy Pulsed lavage therapy - wound location: Bilateral Ischial Tuberosities Pulsed Lavage with Suction (psi): 12 psi Pulsed Lavage with Suction - Normal Saline Used: 2000 mL Pulsed Lavage Tip: Tip with splash shield Selective Debridement Selective Debridement - Location: Bilateral Ischial Tuberosities Selective Debridement - Tools Used: Forceps;Scissors;Scalpel Selective Debridement - Tissue Removed: Yellow and black unviable  tissue   Wound Assessment and Plan  Wound Therapy - Assess/Plan/Recommendations Wound Therapy - Clinical Statement: Progressing with  debridement. Pt with very soft stool several times throughout. May want to consider a rectal pouch if this continues, to decrease dressing changes and protect wound integrity. Will continue to follow.  Wound Therapy - Functional Problem List: Paraplegia Factors Delaying/Impairing Wound Healing: Altered sensation;Incontinence;Immobility Hydrotherapy Plan: Debridement;Dressing change;Patient/family education;Pulsatile lavage with suction Wound Therapy - Frequency: 6X / week Wound Therapy - Follow Up Recommendations: Other (comment)(PT recommending CIR) Wound Plan: See above  Wound Therapy Goals- Improve the function of patient's integumentary system by progressing the wound(s) through the phases of wound healing (inflammation - proliferation - remodeling) by: Decrease Necrotic Tissue to: 25% Decrease Necrotic Tissue - Progress: Progressing toward goal Increase Granulation Tissue to: 75% Increase Granulation Tissue - Progress: Progressing toward goal Improve Drainage Characteristics: Min;Serous Goals/treatment plan/discharge plan were made with and agreed upon by patient/family: Yes Time For Goal Achievement: 7 days Wound Therapy - Potential for Goals: Good  Goals will be updated until maximal potential achieved or discharge criteria met.  Discharge criteria: when goals achieved, discharge from hospital, MD decision/surgical intervention, no progress towards goals, refusal/missing three consecutive treatments without notification or medical reason.  GP     [name] 08/14/2018, 10:26 AM   [name], PT, DPT Acute Rehabilitation Services Pager: [phone] Office: [phone]

## 2018-08-15 DIAGNOSIS — C649 Malignant neoplasm of unspecified kidney, except renal pelvis: Secondary | ICD-10-CM

## 2018-08-15 DIAGNOSIS — C7989 Secondary malignant neoplasm of other specified sites: Secondary | ICD-10-CM

## 2018-08-15 LAB — CULTURE, BLOOD (ROUTINE X 2)
Culture: NO GROWTH
Special Requests: ADEQUATE

## 2018-08-15 LAB — BASIC METABOLIC PANEL
Anion gap: 14 (ref 5–15)
BUN: 13 mg/dL (ref 6–20)
CO2: 20 mmol/L — ABNORMAL LOW (ref 22–32)
Calcium: 7.8 mg/dL — ABNORMAL LOW (ref 8.9–10.3)
Chloride: 106 mmol/L (ref 98–111)
Creatinine, Ser: 1.12 mg/dL — ABNORMAL HIGH (ref 0.44–1.00)
GFR calc Af Amer: >60 mL/min (ref >60)
GFR calc non Af Amer: 56 mL/min — ABNORMAL LOW (ref >60)
Glucose, Bld: 71 mg/dL (ref 70–99)
Potassium: 3.8 mmol/L (ref 3.5–5.1)
Sodium: 140 mmol/L (ref 135–145)

## 2018-08-15 LAB — CBC
HCT: 26 % — ABNORMAL LOW (ref 36.0–46.0)
Hemoglobin: 8 g/dL — ABNORMAL LOW (ref 12.0–15.0)
MCH: 28.8 pg (ref 26.0–34.0)
MCHC: 30.8 g/dL (ref 30.0–36.0)
MCV: 93.5 fL (ref 80.0–100.0)
Platelets: 212 10*3/uL (ref 150–400)
RBC: 2.78 MIL/uL — ABNORMAL LOW (ref 3.87–5.11)
RDW: 14.6 % (ref 11.5–15.5)
WBC: 4.5 10*3/uL (ref 4.0–10.5)
nRBC: 0 % (ref 0.0–0.2)

## 2018-08-15 MED ORDER — SODIUM CHLORIDE 0.9 % IV SOLN
1.0000 g | INTRAVENOUS | Status: DC
  Administered 2018-08-15 – 2018-08-16 (×2): 1 g via INTRAVENOUS
  Filled 2018-08-15 (×3): qty 10

## 2018-08-15 MED ORDER — RIVAROXABAN 20 MG PO TABS
20.0000 mg | ORAL_TABLET | Freq: Every day | ORAL | Status: DC
  Administered 2018-08-15 – 2018-08-17 (×3): 20 mg via ORAL
  Filled 2018-08-15 (×3): qty 1

## 2018-08-15 MED ORDER — CLINDAMYCIN HCL 300 MG PO CAPS
600.0000 mg | ORAL_CAPSULE | Freq: Three times a day (TID) | ORAL | Status: DC
  Administered 2018-08-15 – 2018-08-17 (×6): 600 mg via ORAL
  Filled 2018-08-15 (×8): qty 2

## 2018-08-15 NOTE — Progress Notes (Addendum)
Family Medicine Teaching Service Daily Progress Note Intern Pager: [phone]  Patient name: Sarah [name] Medical record number: [id number] Date of birth: 1965-06-20 Age: 53 y.o. Gender: female  Primary Care Provider: [name], MD Consultants: gen surg Code Status: full  Pt Overview and Major Events to Date:  5/19 - admitted to [facility], wound debridement  Assessment and Plan: Sarah [name] a 53 y.o.femalepresenting with worsening of gluteal wound and fever concerning for bacterial wound infection. PMH is significant for paraplegia, metastatic cancer, GERD, depression,   Fever 2/2 b/l stage IV inferior gluteal decub ulcers Debridement on 5/19.  S/P vancomycin cefepime and Flagyl, stopped 5/24.   New set of blood cultures showed NGTD.  Initial blood culture from 5/18 with growth in 1 bottle GPC likely contaminant.  Patient also found to have Klebsiella UTI.  Finding on CT pelvis suggestive of possible gas or decubitus.  switched to clindamycin to cover for clostridium perfringens given gas in wound on 5/24.  WBC 4.3, has remained afebrile since admission. -Follow-up on general surgery consult, appreciate recs -clindamycin 600 mg tid ( total 10 days could extend to 14 days based on appearance of the wound) -cont Xarelto 20 mg daily, restarted 5/24 -Follow-up on repeat blood cx -Wound care -Phenergan p.o. as needed (QTc)  Paraplegia 2/2 metastatichurthle cell carcinoma- thyroidectomy in 2015. Recent craniotomy in march. 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 She is incontinent of bowel and bladder. She takes baclofen for muscle spasms and synthroid for postop hypothyroidism.  - continue baclofen - continue synthroid - PT/OT: CIR, have been consulted - wound care consult  Normocytic Anemia Has had previous transfusions and GI evaluation that included FOBT during 02/2018 that was negative, did not receive scope at that time due to comorbidities.  Transfusion threshold 7.  Prior iron panel has suggested IDA with low iron, although low TIBC, but also low sat.  Hgb stable at 8.5 this AM, stable. - Follow up on am CBC -Xarelto 20 mg daily, restarted 5/24 - s/p 1 unit pRBC - Ferrous sulfate QOD - outpatient GI f/u  Constipation  Nausea Large stool burden noted on imaging. Patient has a home bowel regimen with miralax and senna. 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 No current increased risk of bleeding.  Hemoglobin continues to be stable. - cont Xarelto, restarted 5/24 --Monitor for signs of bleeding  Vaginal candidiasis  Multiple recent office visits for vaginal candidiasis and candidal intertrigo. She has been using nystatin powder.  Patient wheelchair-bound with history of incontinence.  We will continue to monitor and treat as needed  GERD - takes pantoprazole 40mg  at home.  - continue home meds  Depression - takes zoloft 50mg  at home.  - continue home meds  Hypothyroidism - 2/2 thyroid cancer and thyroidectomy. Takes 162mcg daily at home - continue home meds.   Seasonal allergies - patient takes flonase and zyrtec at home.  - continue flonase - claritin on forumulary  FEN/GI: Regular PPx: Xarelto  Disposition: CIR pending bed placement.  Medically stable for discharge.  Subjective:  Patient denies complaints this AM.  No acute events overnight.  Objective: Temp:  [97.8 F (36.6 C)-98 F (36.7 C)] 98 F (36.7 C) (05/24 2119) Pulse Rate:  [53-64] 53 (05/24 2119) Resp:  [18-19] 19 (05/24 2119)  BP: (104-134)/(65-75) 122/73 (05/24 2119) SpO2:  [98 %-99 %] 99 % (05/24 2119)  Physical Exam:  General: 53 y.o. female in NAD Cardio: RRR no m/r/g Lungs: CTAB, no wheezing, no rhonchi, no crackles, no IWOB on RA Abdomen: Soft, non-tender to palpation, non-distended,  positive bowel sounds Skin: warm and dry, pressure ulcers per surgery and wound care Extremities: No edema  Laboratory: Recent Labs  Lab 08/13/18 0422 08/14/18 0510 08/15/18 0811  WBC 4.9 6.2 4.5  HGB 8.5* 8.7* 8.0*  HCT 26.1* 27.5* 26.0*  PLT 257 258 212   Recent Labs  Lab 08/09/18 2129  08/13/18 0422 08/14/18 0510 08/15/18 0811  NA 135   < > 140 141 140  K 4.1   < > 3.9 4.5 3.8  CL 98   < > 105 106 106  CO2 25   < > 23 19* 20*  BUN 17   < > 9 13 13   CREATININE 1.15*   < > 1.24* 1.18* 1.12*  CALCIUM 8.3*   < > 7.7* 7.9* 7.8*  PROT 6.8  --   --   --   --   BILITOT 1.0  --   --   --   --   ALKPHOS 387*  --   --   --   --   ALT 24  --   --   --   --   AST 29  --   --   --   --   GLUCOSE 145*   < > 75 74 71   < > = values in this interval not displayed.     Imaging/Diagnostic Tests: No results found.  Meccariello, Bernita Raisin, DO 08/16/2018, 2:13 AM PGY-1, [place] Intern pager: [phone], text pages welcome

## 2018-08-15 NOTE — Progress Notes (Signed)
Family Medicine Teaching Service Daily Progress Note Intern Pager: [phone]  Patient name: Sarah [name] Medical record number: [id number] Date of birth: 04-02-1965 Age: 53 y.o. Gender: female  Primary Care Provider: [name], MD Consultants: General Surgery Code Status: Full  Pt Overview and Major Events to Date:  5/19 Admitted FPTS, wound debridement  Assessment and Plan: Sarah [name] is a 53 y.o. female presenting with worsening of gluteal wound and fever concerning for bacterial wound infection . PMH is significant for paraplegia, metastatic cancer,  GERD, depression,   Fever 2/2 b/l stage IV inferior gluteal decub ulcers Patient continued to be afebrile since admission.  Now day 5 status post debridement.  Currently on vancomycin cefepime and Flagyl.  New set of blood cultures showed no growth to date.  Initial blood culture from 5/18 with growth in 1 bottle GPC likely contaminant.  Patient also found to have Klebsiella UTI.  Finding on CT pelvis suggestive of possible gas or decubitus.  Given the continued progress will likely transition to p.o. antibiotic with coverage for anaerobes. --Follow-up on general surgery consult, appreciate recs --Discontinue cefepime, Flagyl, vancomycin --Start patient on clindamycin 600 mg tid ( total 10 days could extend to 14 days based on appearance of the wound) --One dose ceftriaxone 1 g to complete treatment course for UTI ( 7 days total) --Resume Xarelto 20 mg daily  --Follow-up on blood and urine cultures --Wound care --Phenergan p.o. 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  Recent craniotomy in march.  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  She is incontinent of bowel and bladder. She takes baclofen for muscle spasms and synthroid for postop hypothyroidism.  - continue baclofen - continue synthroid - PT/OT: CIR, have been consulted - wound care consult  Normocytic Anemia  Has  had previous transfusions and GI evaluation that included FOBT during 02/2018 that was negative, did not receive scope at that time due to comorbidities.  Transfusion threshold 7. Prior iron panel has suggested IDA with low iron, although low TIBC, but also low sat.  Hgb stable at 8.0 this AM, stable. -- Follow up on am CBC --Resume Xarelto 20 mg daily  - s/p 1 unit pRBC - Ferrous sulfate QOD consider holding in the setting of infection and constipation - outpatient GI f/u  Constipation  Nausea Large stool burden noted on imaging. Patient has a home bowel regimen with miralax and senna. 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  No current increased risk of bleeding.  Hemoglobin continues to be stable. -Restart Xarelto today --Monitor for signs of bleeding  Vaginal candidiasis  Multiple recent office visits for vaginal candidiasis and candidal intertrigo. She has been using nystatin powder.  Patient wheelchair-bound with history of incontinence.  We will continue to monitor and treat as needed   GERD - takes pantoprazole 40mg  at home.   - continue home meds  Depression - takes zoloft 50mg  at home.  - continue home meds  Hypothyroidism - 2/2 thyroid cancer and thyroidectomy.  Takes 146mcg daily at home - continue home meds.   Seasonal allergies - patient takes flonase and zyrtec at home.  - continue flonase - claritin on forumulary  FEN/GI: Regular PPx: Xarelto  Disposition: CIR pending medical stabilization  Subjective:  Patient reports mild nausea this morning, but improved from the two days ago. Able to have A bowel movement but still feeling bloated. Appetite is poor but denies any pain.   Objective: Temp:  [97.5 F (36.4 C)-98.5 F (36.9 C)] 98.4 F (36.9 C) (05/23 2150) Pulse Rate:   [63-67] 67 (05/23 2150) Resp:  [18-20] 19 (05/23 2150) BP: (89-128)/(50-71) 128/70 (05/23 2150) SpO2:  [98 %-100 %] 98 % (05/23 2150)  Physical Exam:  General: 53 y.o. female in NAD HEENT: left TM intact, no erythema Cardio: RRR no m/r/g Lungs: CTAB, no wheezing, no rhonchi, no crackles, no IWOB on RA Abdomen: Soft, non-tender to palpation, non-distended, positive bowel sounds Skin: warm and dry, gluteal pressure wounds seen while wound care occurring, no necrotic tissue noted, appear unchanged from yesterday's images on surgery note Extremities: No edema, prevalon boots bilaterally    Laboratory: Recent Labs  Lab 08/12/18 0353 08/13/18 0422 08/14/18 0510  WBC 5.4 4.9 6.2  HGB 7.8* 8.5* 8.7*  HCT 24.0* 26.1* 27.5*  PLT 223 257 258   Recent Labs  Lab 08/09/18 2129  08/12/18 0353 08/13/18 0422 08/14/18 0510  NA 135   < > 139 140 141  K 4.1   < > 4.2 3.9 4.5  CL 98   < > 107 105 106  CO2 25   < > 22 23 19*  BUN 17   < > 14 9 13   CREATININE 1.15*   < > 1.12* 1.24* 1.18*  CALCIUM 8.3*   < > 7.5* 7.7* 7.9*  PROT 6.8  --   --   --   --   BILITOT 1.0  --   --   --   --   ALKPHOS 387*  --   --   --   --   ALT 24  --   --   --   --   AST 29  --   --   --   --   GLUCOSE 145*   < > 82 75 74   < > = values in this interval not displayed.    Imaging/Diagnostic Tests: No results found.  [name], MD 08/15/2018, 12:24 AM PGY-3, [place] Intern pager: [phone], text pages welcome

## 2018-08-15 NOTE — Progress Notes (Signed)
S: nauseated this morning, no fevers or chills O: BP 109/65 (BP Location: Right Arm)   Pulse 64   Temp 98 F (36.7 C) (Oral)   Resp 19   Ht 5\' 11"  (1.803 m)   Wt 101.4 kg   LMP 10/22/2016 (LMP Unknown)   SpO2 99%   BMI 31.18 kg/m  Gen: NAD Neuro: AOx4 Skin: right large decubitus ulcer with some necrosis in base, left smaller decubitus ulcer with clean base  A/P 53 yo female with b/l decubitus ulcer Continue hydrotherapy Ok to transfer to rehab Continue oral abx

## 2018-08-16 DIAGNOSIS — C73 Malignant neoplasm of thyroid gland: Secondary | ICD-10-CM

## 2018-08-16 DIAGNOSIS — C7951 Secondary malignant neoplasm of bone: Secondary | ICD-10-CM

## 2018-08-16 LAB — CBC WITH DIFFERENTIAL/PLATELET
Abs Immature Granulocytes: 0.3 10*3/uL — ABNORMAL HIGH (ref 0.00–0.07)
Basophils Absolute: 0 10*3/uL (ref 0.0–0.1)
Basophils Relative: 1 %
Eosinophils Absolute: 0.2 10*3/uL (ref 0.0–0.5)
Eosinophils Relative: 4 %
HCT: 27.1 % — ABNORMAL LOW (ref 36.0–46.0)
Hemoglobin: 8.5 g/dL — ABNORMAL LOW (ref 12.0–15.0)
Immature Granulocytes: 7 %
Lymphocytes Relative: 24 %
Lymphs Abs: 1 10*3/uL (ref 0.7–4.0)
MCH: 28.9 pg (ref 26.0–34.0)
MCHC: 31.4 g/dL (ref 30.0–36.0)
MCV: 92.2 fL (ref 80.0–100.0)
Monocytes Absolute: 0.3 10*3/uL (ref 0.1–1.0)
Monocytes Relative: 7 %
Neutro Abs: 2.5 10*3/uL (ref 1.7–7.7)
Neutrophils Relative %: 57 %
Platelets: 226 10*3/uL (ref 150–400)
RBC: 2.94 MIL/uL — ABNORMAL LOW (ref 3.87–5.11)
RDW: 14.5 % (ref 11.5–15.5)
WBC: 4.3 10*3/uL (ref 4.0–10.5)
nRBC: 0 % (ref 0.0–0.2)

## 2018-08-16 NOTE — Progress Notes (Signed)
Inpatient Rehab Admissions Coordinator:   I met with patient at bedside.  I'm awaiting insurance authorization for possible admission today or tomorrow.  Pt continues to be motivated for CIR level therapies.    Shann Medal, PT, DPT Admissions Coordinator [phone] 08/16/18  10:50 AM

## 2018-08-16 NOTE — H&P (Signed)
Physical Medicine and Rehabilitation Admission H&P    Chief Complaint  Patient presents with  . Mutliple Complaints  Chief complaint: Weakness HPI: Sarah [name]. 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 and has received inpatient rehab services in the past last being 09/21/2017 until 10/30/2017 was discharged to [facility] skilled nursing facility.  Patient has since returned home with her sister and brother-in-law nonambulatory using a sliding board with 1 person assist for bed to wheelchair transfers.  Sister and brother-in-law all assist in all aspects of bathing dressing toileting and functional mobility.  They live in a 1 level home with ramped entrance.  Presented 08/10/2018 with severe sacral decubitus ulcer that began in late February gradually worsened since that time as well as fever of 102.5, WBC 16,800, hemoglobin 6.1.  Initial blood culture from 518 with growth in 1 bottle GPC likely contaminant.  Follow-up blood cultures show no growth to date.  Placed on intravenous broad-spectrum antibiotics and transitioned to p.o clindamycin completed 08/17/2018.  CT the pelvis showed right inferior pubic ramus region gas containing decubitus wound new since February PET/CT but no CT evidence of osteomyelitis.  Underwent bedside debridement per general surgery with hydrotherapy initiated.  Hospital course pain management.  She remains on chronic Xarelto as prior to admission.  Acute on chronic anemia latest hemoglobin 8.5.  Klebsiella UTI completing a course of Rocephin.  Tolerating a regular diet.  Therapy evaluations completed and patient was admitted for a comprehensive rehab program.  Review of Systems   Constitutional: Positive for fever.  HENT: Negative for hearing loss.   Eyes: Negative for blurred vision and double vision.  Respiratory: Negative for cough and shortness of breath.   Cardiovascular: Positive for leg swelling. Negative for chest pain and palpitations.  Gastrointestinal: Positive for nausea. Negative for heartburn and vomiting.       GERD neurogenic bowel  Genitourinary: Negative for dysuria, flank pain and hematuria.       Neurogenic bladder  Musculoskeletal: Positive for back pain, joint pain and myalgias.  Skin: Negative for rash.  Psychiatric/Behavioral: Positive for depression. The patient has insomnia.        Anxiety  All other systems reviewed and are negative.  Past Medical History:  Diagnosis Date  . Anxiety   . Cancer [facility])    FNA of thyroid positive for onconytic/hurthle cell carcinoma  . Chronic back pain   . Complication of anesthesia    Nausea and vomiting  . DDD (degenerative disc disease), cervical   . Depression   . DJD (degenerative joint disease)   . Family history of adverse reaction to anesthesia    MOTHER HAS NAUSEA   . GERD (gastroesophageal reflux disease)   . History of blood transfusion    08/2017  . History of rectal fissure   . HIT (heparin-induced thrombocytopenia) ([place])   . Hypertension    05/27/2018- not current- runs low  . Hypothyroidism   . Obesity   . Paraplegia at T4 level ([place])   . Pneumonia 03/2018  . Pulmonary embolus, right ([place]) 2015  . Rotator cuff tendonitis right   Past Surgical History:  Procedure Laterality Date  . APPLICATION OF CRANIAL NAVIGATION N/A 06/01/2018   Procedure: APPLICATION OF  CRANIAL NAVIGATION;  Surgeon: Erline Levine, MD;  Location: [place];  Service: Neurosurgery;  Laterality: N/A;  . CHOLECYSTECTOMY N/A 01/25/2018   Procedure: LAPAROSCOPIC CHOLECYSTECTOMY;  Surgeon: Virl Cagey, MD;  Location: [facility];  Service: General;  Laterality: N/A;  . CRANIOTOMY N/A 06/01/2018   Procedure:  Craniectomy for skull metastasis with Brainlab;  Surgeon: Erline Levine, MD;  Location: [place];  Service: Neurosurgery;  Laterality: N/A;  . FEMUR IM NAIL Left 09/11/2017   Procedure: INTRAMEDULLARY (IM) RETROGRADE FEMORAL NAILING;  Surgeon: Altamese Washburn, MD;  Location: [place];  Service: Orthopedics;  Laterality: Left;  . LAMINECTOMY N/A 12/14/2013   Procedure: THORACIC LAMINECTOMY FOR TUMOR THORACIC THREE;  Surgeon: Ashok Pall, MD;  Location: [place] [facility];  Service: Neurosurgery;  Laterality: N/A;  THORACIC LAMINECTOMY FOR TUMOR THORACIC THREE  . LAMINECTOMY N/A 07/05/2015   Procedure: THORACIC LAMINECTOMY FOR TUMOR;  Surgeon: Ashok Pall, MD;  Location: [place] [facility];  Service: Neurosurgery;  Laterality: N/A;  . LAMINECTOMY N/A 09/03/2017   Procedure: POSTERIOR SPINAL TUMOR RESECTION THORACIC THREE;  Surgeon: Ashok Pall, MD;  Location: [place];  Service: Neurosurgery;  Laterality: N/A;  POSTERIOR SPINAL TUMOR RESECTION THORACIC THREE  . POSTERIOR LUMBAR FUSION 4 LEVEL N/A 12/30/2013   Procedure: Thoracic one-Thoracic five posterior thoracic fusion with pedicle screws;  Surgeon: Ashok Pall, MD;  Location: [place] [facility];  Service: Neurosurgery;  Laterality: N/A;  Thoracic one-Thoracic five posterior thoracic fusion with pedicle screws  . THYROIDECTOMY N/A 01/09/2014   Procedure: TOTAL THYROIDECTOMY;  Surgeon: Izora Gala, MD;  Location: [place];  Service: ENT;  Laterality: N/A;  . TONSILLECTOMY     Family History  Problem Relation Age of Onset  . Hypertension Mother   . Diabetes Mother   . Hypertension Father   . Stroke Father   . Diabetes Father   . Diabetes Sister   . Diabetes Sister    Social History:  reports that she has never smoked. She has never used smokeless tobacco. She reports that she does not drink alcohol or use drugs. Allergies:  Allergies  Allergen Reactions  . Bee Venom Anaphylaxis  . Heparin Other (See Comments)    Weak positive platelets induced antibodies, SRA  negative--2015  . Keflex [Cephalexin] Nausea And Vomiting  . Tramadol Nausea And Vomiting  . Gabapentin Nausea And Vomiting  . Hydrochlorothiazide Nausea And Vomiting  . Hydrocodone Nausea And Vomiting  . Oxycodone-Acetaminophen Nausea And Vomiting  . Sorbitol Nausea Only    And abdominal distress  . Tizanidine     Fatigue    Medications Prior to Admission  Medication Sig Dispense Refill  . acetaminophen (TYLENOL) 325 MG tablet Take 2 tablets (650 mg total) by mouth every 4 (four) hours as needed for mild pain ((score 1 to 3) or temp > 100.5).    [name] Kitchen albuterol (PROVENTIL) (2.5 MG/3ML) 0.083% nebulizer solution Take 3 mLs (2.5 mg total) by nebulization every 6 (six) hours as needed for wheezing or shortness of breath. 75 mL 12  . baclofen (LIORESAL) 20 MG tablet Take 1 tablet (20 mg total) by mouth 2 (two) times daily. 60 tablet 4  . cetirizine (ZYRTEC) 10 MG tablet Take 1 tablet (10 mg total) by mouth daily. 30 tablet 11  . fluticasone (FLONASE) 50 MCG/ACT nasal spray Place 2 sprays into both nostrils daily. 16 g 6  . [EXPIRED] HYDROcodone-acetaminophen (NORCO/VICODIN) 5-325 MG tablet Take 1 tablet by mouth every 8 (eight) hours as needed for up to 10 days for  moderate pain. 30 tablet 0  . ipratropium-albuterol (DUONEB) 0.5-2.5 (3) MG/3ML SOLN Take 3 mLs by nebulization 3 (three) times daily. 360 mL   . levothyroxine (SYNTHROID) 150 MCG tablet Take 1 tablet (150 mcg total) by mouth daily before breakfast. 30 tablet 1  . Multiple Vitamin (MULTIVITAMIN WITH MINERALS) TABS tablet Take 1 tablet by mouth daily.    [name] Kitchen nystatin (MYCOSTATIN/NYSTOP) powder Apply topically 4 (four) times daily. To groin. 60 g 3  . ondansetron (ZOFRAN) 4 MG tablet Take 1 tablet by mouth every 6 (six) hours as needed for nausea or vomiting.     [name] Kitchen oxymetazoline (AFRIN NASAL SPRAY) 0.05 % nasal spray Place 1 spray into both nostrils 2 (two) times daily. 30 mL 0  . pantoprazole (PROTONIX) 40 MG tablet Take 1 tablet (40 mg  total) by mouth daily. (Patient taking differently: Take 40 mg by mouth daily. ) 30 tablet 0  . polyethylene glycol (MIRALAX / GLYCOLAX) packet Take 17 g by mouth daily. 14 each 0  . rivaroxaban (XARELTO) 20 MG TABS tablet Take 1 tablet (20 mg total) by mouth daily with supper. 30 tablet 3  . senna (SENOKOT) 8.6 MG TABS tablet Take 1 tablet (8.6 mg total) by mouth at bedtime as needed for mild constipation. 120 each 0  . sertraline (ZOLOFT) 50 MG tablet Take 50 mg by mouth daily.     . fluconazole (DIFLUCAN) 100 MG tablet Take 1 tablet (100 mg total) by mouth daily. 14 tablet 0    Drug Regimen Review Drug regimen was reviewed and remains appropriate with no significant issues identified  Home: Home Living Family/patient expects to be discharged to:: Private residence Living Arrangements: Other relatives Available Help at Discharge: Available PRN/intermittently Type of Home: House Home Access: Ramped entrance Home Layout: One level Bathroom Shower/Tub: Chiropodist: Handicapped height Bathroom Accessibility: No Home Equipment: Environmental consultant - 2 wheels, Bedside commode, Shower seat, Wheelchair - power, Wheelchair - manual, Hospital bed, Other (comment) Additional Comments: Bathroom is not accessible.  Pt sponge bathes and performs bowel program supine.  uses sliding board for transfers to power wheelchair   Functional History: Prior Function Level of Independence: Needs assistance Gait / Transfers Assistance Needed: nonambulatory, uses sliding board with 1 person assist for bed<>w/c transfers ADL's / Homemaking Assistance Needed: Requires "a lot" of help from family  Comments: pt has been non-ambulatory about 1 year, was on CIR in July 2019  Functional Status:  Mobility: Bed Mobility Overal bed mobility: Needs Assistance Bed Mobility: Rolling Rolling: Min assist Sidelying to sit: Mod assist, +2 for physical assistance Sit to sidelying: Mod assist, +2 for physical  assistance General bed mobility comments: +2 assist to reposition after min assist for rolling Transfers Overall transfer level: Needs assistance Equipment used: Sliding board Transfers: Lateral/Scoot Transfers  Lateral/Scoot Transfers: Mod assist General transfer comment: deferred due to arrival of meal      ADL: ADL Overall ADL's : Needs assistance/impaired Eating/Feeding: Independent Grooming: Wash/dry hands, Wash/dry face, Oral care, Brushing hair, Set up, Bed level Upper Body Bathing: Minimal assistance, Bed level Upper Body Bathing Details (indicate cue type and reason): assist due to pain  Lower Body Bathing: Maximal assistance, Bed level Upper Body Dressing : Maximal assistance, Bed level Lower Body Dressing: Total assistance, Bed level Toilet Transfer: Total assistance Toilet Transfer Details (indicate cue type and reason): does not perform at home.  performs bowel program supine  Toileting- Clothing Manipulation and Hygiene: Total assistance, Bed level Functional mobility during  ADLs: Maximal assistance, +2 for physical assistance General ADL Comments: able to provide instruction and guidance for how to assist her with transfers  Cognition: Cognition Overall Cognitive Status: Within Functional Limits for tasks assessed Cognition Arousal/Alertness: Awake/alert Behavior During Therapy: WFL for tasks assessed/performed Overall Cognitive Status: Within Functional Limits for tasks assessed  Physical Exam: Blood pressure 128/81, pulse 64, temperature 98.2 F (36.8 C), temperature source Oral, resp. rate 20, height 5\' 11"  (1.803 m), weight 101.3 kg, last menstrual period 10/22/2016, SpO2 98 %. Physical Exam  Constitutional: She is oriented to person, place, and time. No distress.  Obese  HENT:  Head: Normocephalic and atraumatic.  Eyes: Pupils are equal, round, and reactive to light. Conjunctivae and EOM are normal.  Neck: Normal range of motion. No tracheal deviation  present. No thyromegaly present.  Cardiovascular: Normal rate and regular rhythm. Exam reveals no friction rub.  No murmur heard. Respiratory: Effort normal. No respiratory distress. She has no wheezes. She has no rales.  GI: Soft. She exhibits no distension. There is no abdominal tenderness. There is no rebound.  Musculoskeletal:        General: Edema present.  Neurological: She is alert and oriented to person, place, and time. No cranial nerve deficit.  [name] alert in no acute distress.  Follows full commands.  [place]. UE 4/5. LE: 1-2/5 prox to distal with extensor tone. DTR's 3+. Decreased LT and pain in both legs.   Skin: Skin is warm. She is not diaphoretic.  Patient currently receiving hydrotherapy for sacral decubitus, large stage III+ wounds. Ongoing fibronecrotic debris present but improving  Psychiatric: She has a normal mood and affect. Her behavior is normal.    Results for orders placed or performed during the hospital encounter of 08/09/18 (from the past 48 hour(s))  CBC     Status: Abnormal   Collection Time: 08/15/18  8:11 AM  Result Value Ref Range   WBC 4.5 4.0 - 10.5 K/uL   RBC 2.78 (L) 3.87 - 5.11 MIL/uL   Hemoglobin 8.0 (L) 12.0 - 15.0 g/dL   HCT 26.0 (L) 36.0 - 46.0 %   MCV 93.5 80.0 - 100.0 fL   MCH 28.8 26.0 - 34.0 pg   MCHC 30.8 30.0 - 36.0 g/dL   RDW 14.6 11.5 - 15.5 %   Platelets 212 150 - 400 K/uL   nRBC 0.0 0.0 - 0.2 %    Comment: Performed at [facility], [place] [address], [place], [place] [address]  Basic metabolic panel     Status: Abnormal   Collection Time: 08/15/18  8:11 AM  Result Value Ref Range   Sodium 140 135 - 145 mmol/L   Potassium 3.8 3.5 - 5.1 mmol/L   Chloride 106 98 - 111 mmol/L   CO2 20 (L) 22 - 32 mmol/L   Glucose, Bld 71 70 - 99 mg/dL   BUN 13 6 - 20 mg/dL   Creatinine, Ser 1.12 (H) 0.44 - 1.00 mg/dL   Calcium 7.8 (L) 8.9 - 10.3 mg/dL   GFR calc non Af Amer 56 (L) >60 mL/min   GFR calc Af Amer >60 >60  mL/min   Anion gap 14 5 - 15    Comment: Performed at [facility], [place] [address], [place], [place] [address]  CBC with Differential/Platelet     Status: Abnormal   Collection Time: 08/16/18  4:38 AM  Result Value Ref Range   WBC 4.3 4.0 - 10.5 K/uL  RBC 2.94 (L) 3.87 - 5.11 MIL/uL   Hemoglobin 8.5 (L) 12.0 - 15.0 g/dL   HCT 27.1 (L) 36.0 - 46.0 %   MCV 92.2 80.0 - 100.0 fL   MCH 28.9 26.0 - 34.0 pg   MCHC 31.4 30.0 - 36.0 g/dL   RDW 14.5 11.5 - 15.5 %   Platelets 226 150 - 400 K/uL   nRBC 0.0 0.0 - 0.2 %   Neutrophils Relative % 57 %   Neutro Abs 2.5 1.7 - 7.7 K/uL   Lymphocytes Relative 24 %   Lymphs Abs 1.0 0.7 - 4.0 K/uL   Monocytes Relative 7 %   Monocytes Absolute 0.3 0.1 - 1.0 K/uL   Eosinophils Relative 4 %   Eosinophils Absolute 0.2 0.0 - 0.5 K/uL   Basophils Relative 1 %   Basophils Absolute 0.0 0.0 - 0.1 K/uL   WBC Morphology MILD LEFT SHIFT (1-5% METAS, OCC MYELO, OCC BANDS)    Immature Granulocytes 7 %   Abs Immature Granulocytes 0.30 (H) 0.00 - 0.07 K/uL   Polychromasia PRESENT     Comment: Performed at [facility], [place] [address], [place], [place] [address]  CBC     Status: Abnormal   Collection Time: 08/17/18  4:03 AM  Result Value Ref Range   WBC 4.5 4.0 - 10.5 K/uL   RBC 3.10 (L) 3.87 - 5.11 MIL/uL   Hemoglobin 8.9 (L) 12.0 - 15.0 g/dL   HCT 28.0 (L) 36.0 - 46.0 %   MCV 90.3 80.0 - 100.0 fL   MCH 28.7 26.0 - 34.0 pg   MCHC 31.8 30.0 - 36.0 g/dL   RDW 14.5 11.5 - 15.5 %   Platelets 217 150 - 400 K/uL   nRBC 0.0 0.0 - 0.2 %    Comment: Performed at [facility], [place] [address], [place], [place] [address]  Basic metabolic panel     Status: Abnormal   Collection Time: 08/17/18  4:03 AM  Result Value Ref Range   Sodium 137 135 - 145 mmol/L   Potassium 3.7 3.5 - 5.1 mmol/L   Chloride 103 98 - 111 mmol/L   CO2 24 22 - 32 mmol/L   Glucose, Bld 70 70 - 99 mg/dL   BUN 9 6 - 20 mg/dL   Creatinine, Ser 1.11 (H) 0.44 - 1.00 mg/dL    Calcium 8.1 (L) 8.9 - 10.3 mg/dL   GFR calc non Af Amer 57 (L) >60 mL/min   GFR calc Af Amer >60 >60 mL/min   Anion gap 10 5 - 15    Comment: Performed at [facility], [place] [address] [place], [place] [address]   *Note: Due to a large number of results and/or encounters for the requested time period, some results have not been displayed. A complete set of results can be found in Results Review.   No results found.     Medical Problem List and Plan: 1.  Debility/paraplegia secondary to sacral decubitus status post debridement with history of metastatic Hurthle cancer followed by Dr.Kale   -admit to inpatient rehab 2.  Antithrombotics: -DVT/anticoagulation: Chronic Xarelto for history of pulmonary emboli  -antiplatelet therapy: N/A 3. Pain Management: Baclofen 20 mg 3 times daily, oxycodone as needed 4. Mood: Zoloft 50 mg daily  -antipsychotic agents: N/A 5. Neuropsych: This patient is capable of making decisions on her own behalf. 6. Skin/Wound Care: Continue santyl and hydrotherapy. Vac placement when adequate fibronecrotic tissue removal 7. Fluids/Electrolytes/Nutrition: Routine  in and outs with follow-up chemistries 8.  Acute on chronic anemia.  Follow-up CBC.  Continue iron supplement 9.  Neurogenic bowel and bladder as well as Klebsiella UTI.  Established bowel program.  Complete course of Rocephin 10.  Hypothyroidism.  Synthroid       Lavon Paganini Angiulli, PA-C 08/17/2018

## 2018-08-16 NOTE — Progress Notes (Signed)
Physical Therapy Wound Treatment Patient Details  Name: Sarah [name] MRN: [id number] Date of Birth: Aug 11, 1965  Today's Date: 08/16/2018 Time: 1020-1104 Time Calculation (min): 44 min  Subjective  Subjective: Pleasant and agreeable to hydrotherapy Patient and Family Stated Goals: Heal wounds Date of Onset: (unknown) Prior Treatments: Bedside I&D  Pain Score:   0/10, but premedicated Wound Assessment     Pressure Injury 08/10/18 Unstageable - Full thickness tissue loss in which the base of the ulcer is covered by slough (yellow, tan, gray, green or brown) and/or eschar (tan, brown or black) in the wound bed. (Active)  Dressing Type ABD;Barrier Film (skin prep);Moist to dry;Tape dressing 08/16/2018 11:59 AM  Dressing Changed 08/16/2018 11:59 AM  Dressing Change Frequency Daily 08/16/2018 11:59 AM  State of Healing Early/partial granulation 08/16/2018 11:59 AM  Site / Wound Assessment Black;Dusky;Granulation tissue;Yellow 08/16/2018 11:59 AM  % Wound base Red or Granulating 30% 08/16/2018 11:59 AM  % Wound base Yellow/Fibrinous Exudate 45% 08/16/2018 11:59 AM  % Wound base Black/Eschar 25% 08/16/2018 11:59 AM  Peri-wound Assessment Intact;Induration 08/16/2018 11:59 AM  Wound Length (cm) 3.7 cm 08/11/2018  1:36 PM  Wound Width (cm) 4.1 cm 08/11/2018  1:36 PM  Wound Depth (cm) 2.1 cm 08/11/2018  1:36 PM  Wound Surface Area (cm^2) 15.17 cm^2 08/11/2018  1:36 PM  Wound Volume (cm^3) 31.86 cm^3 08/11/2018  1:36 PM  Tunneling (cm) 0 08/11/2018  1:36 PM  Undermining (cm) 0 08/11/2018  1:36 PM  Margins Unattached edges (unapproximated) 08/16/2018 11:59 AM  Drainage Amount Moderate 08/16/2018 11:59 AM  Drainage Description Serosanguineous 08/16/2018 11:59 AM  Treatment Cleansed;Debridement (Selective);Hydrotherapy (Pulse lavage);Packing (Saline gauze) 08/16/2018 11:59 AM     Pressure Injury 08/10/18 Unstageable - Full thickness tissue loss in which the base of the ulcer is covered by slough (yellow, tan,  gray, green or brown) and/or eschar (tan, brown or black) in the wound bed. (Active)  Wound Image   08/11/2018  1:36 PM  Dressing Type ABD;Barrier Film (skin prep);Moist to dry;Tape dressing 08/16/2018 11:59 AM  Dressing Changed 08/16/2018 11:59 AM  Dressing Change Frequency Daily 08/16/2018 11:59 AM  State of Healing Eschar 08/16/2018 11:59 AM  Site / Wound Assessment Black;Pink;Yellow 08/16/2018 11:59 AM  % Wound base Red or Granulating 25% 08/16/2018 11:59 AM  % Wound base Yellow/Fibrinous Exudate 30% 08/16/2018 11:59 AM  % Wound base Black/Eschar 45% 08/16/2018 11:59 AM  Peri-wound Assessment Intact;Induration 08/16/2018 11:59 AM  Wound Length (cm) 8 cm 08/11/2018  1:36 PM  Wound Width (cm) 8.3 cm 08/11/2018  1:36 PM  Wound Depth (cm) 5 cm 08/11/2018  1:36 PM  Wound Surface Area (cm^2) 66.4 cm^2 08/11/2018  1:36 PM  Wound Volume (cm^3) 332 cm^3 08/11/2018  1:36 PM  Tunneling (cm) 0 08/11/2018  1:36 PM  Undermining (cm) 2.5 cm at 11:00 08/11/2018  1:36 PM  Margins Unattached edges (unapproximated) 08/16/2018 11:59 AM  Drainage Amount Moderate 08/16/2018 11:59 AM  Drainage Description Serosanguineous 08/16/2018 11:59 AM  Treatment Cleansed;Debridement (Selective);Hydrotherapy (Pulse lavage);Other (Comment) 08/16/2018 11:59 AM      Hydrotherapy Pulsed lavage therapy - wound location: Bilateral Ischial Tuberosities Pulsed Lavage with Suction (psi): 12 psi Pulsed Lavage with Suction - Normal Saline Used: 2000 mL Pulsed Lavage Tip: Tip with splash shield Selective Debridement Selective Debridement - Location: Bilateral Ischial Tuberosities Selective Debridement - Tools Used: Forceps;Scissors;Scalpel Selective Debridement - Tissue Removed: Yellow and black unviable tissue   Wound Assessment and Plan  Wound Therapy - Assess/Plan/Recommendations Wound Therapy - Clinical Statement:  Progressing with debridement. Pt with very soft stool several times throughout. May want to consider a rectal pouch if this  continues, to decrease dressing changes and protect wound integrity. Will continue to follow.  Wound Therapy - Functional Problem List: Paraplegia Factors Delaying/Impairing Wound Healing: Altered sensation;Incontinence;Immobility Hydrotherapy Plan: Debridement;Dressing change;Patient/family education;Pulsatile lavage with suction Wound Therapy - Frequency: 6X / week Wound Therapy - Follow Up Recommendations: Other (comment)(PT recommending CIR) Wound Plan: See above  Wound Therapy Goals- Improve the function of patient's integumentary system by progressing the wound(s) through the phases of wound healing (inflammation - proliferation - remodeling) by: Decrease Necrotic Tissue to: 25% Decrease Necrotic Tissue - Progress: Progressing toward goal Increase Granulation Tissue to: 75% Increase Granulation Tissue - Progress: Progressing toward goal Improve Drainage Characteristics: Min;Serous Improve Drainage Characteristics - Progress: Progressing toward goal Goals/treatment plan/discharge plan were made with and agreed upon by patient/family: Yes Time For Goal Achievement: 7 days Wound Therapy - Potential for Goals: Good  Goals will be updated until maximal potential achieved or discharge criteria met.  Discharge criteria: when goals achieved, discharge from hospital, MD decision/surgical intervention, no progress towards goals, refusal/missing three consecutive treatments without notification or medical reason.  GP     Tessie Fass Katerina Zurn 08/16/2018, 12:03 PM  08/16/2018  Donnella Sham, PT Acute Rehabilitation Services [phone]  (pager) [phone]  (office)

## 2018-08-16 NOTE — PMR Pre-admission (Signed)
PMR Admission Coordinator Pre-Admission Assessment  Patient: Sarah [name] is an 53 y.o., female MRN: [id number] DOB: [birth year] Height: _0  (180.3 cm) Weight: 101.4 kg              Insurance Information HMO: yes    PPO:      PCP:      IPA:      80/20:      OTHER:  PRIMARY: UHC Medicare      Policy#: [id number]      Subscriber: patient CM Name: Itza      Phone#: [phone]     Fax#: [phone] Pre-Cert#: [id number]      Employer:  Benefits:  Phone #: [phone]     Name:  Irene Shipper. Date: 07/23/2018     Deduct: $0      Out of Pocket Max: $4500 (met $8.37)      Life Max: n/a CIR: $325/day for days 1-5      SNF: 20 full days Outpatient: $35/visit     Co-Pay:  Home Health: 100%      Co-Pay:  DME: 80%     Co-Pay: 20% Providers: in network  SECONDARY:       Policy#:       Subscriber:  CM Name:       Phone#:      Fax#:  Pre-Cert#:       Employer:  Benefits:  Phone #:      Name:  Eff. Date:      Deduct:       Out of Pocket Max:       Life Max:  CIR:       SNF:  Outpatient:      Co-Pay:  Home Health:       Co-Pay:  DME:      Co-Pay:   Medicaid Application Date:       Case Manager:  Disability Application Date:       Case Worker:   The "Data Collection Information Summary" for patients in Inpatient Rehabilitation Facilities with attached "Privacy Act [place] Records" was provided and verbally reviewed with: Patient  Emergency Contact Information Contact Information    Name Relation Home Work Mobile   Mcnatt,[name] Mother [phone]  [phone]   Ezzard Flax [phone]       Current Medical History  Patient Admitting Diagnosis: sepsis 2/2 sacral decubitus ulcers, T3 SCI  History of Present Illness: Sarah [name] is a 53 y.o. 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 and received inpatient rehab services 09/21/2017 until 10/30/2017 and was discharged to [facility] skilled nursing facility.  Patient has since returned home living with her sister and brother-in-law nonambulatory using sliding board with 1 person assist for bed to wheelchair transfers.  Sister and brother-in-law all assist in all aspects of bathing dressing toileting and functional mobility 1 level home with ramped entrance.  Presented 08/10/2018 with severe sacral decubitus ulcers that began in late February and have gradually worsened since that time as well as fever 102.5, WBC 16,800.  Placed on broad-spectrum antibiotics.  CT of the pelvis showed right inferior pubic ramus region gas containing decubitus wound new since February PET/CT but no CT evidence of osteomyelitis.  Underwent bedside debridement per general surgery with hydrotherapy initiated.  Hospital course pain  management.  Therapy evaluations completed with recommendations of physical medicine rehab consult.      Past Medical History  Past Medical History:  Diagnosis Date  . Anxiety   . 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 Chronic back pain   . Complication of anesthesia    Nausea and vomiting  . DDD (degenerative disc disease), cervical   . Depression   . DJD (degenerative joint disease)   . Family history of adverse reaction to anesthesia    MOTHER HAS NAUSEA   . GERD (gastroesophageal reflux disease)   . History of blood transfusion    08/2017  . History of rectal fissure   . HIT (heparin-induced thrombocytopenia) ([place])   . Hypertension    05/27/2018- not current- runs low  . Hypothyroidism   . Obesity   . Paraplegia at T4 level ([place])   . Pneumonia 03/2018  . Pulmonary embolus, right ([place]) 2015  . Rotator cuff tendonitis right    Family History  family history includes Diabetes in her father, mother, sister,  and sister; Hypertension in her father and mother; Stroke in her father.  Prior Rehab/Hospitalizations:  Has the patient had prior rehab or hospitalizations prior to admission? Yes  Has the patient had major surgery during 100 days prior to admission? No  Current Medications   Current Facility-Administered Medications:  .  0.9 %  sodium chloride infusion, 250 mL, Intravenous, PRN, [name], MD, Stopped at 08/10/18 1116 .  acetaminophen (TYLENOL) tablet 650 mg, 650 mg, Oral, Q6H PRN **OR** acetaminophen (TYLENOL) suppository 650 mg, 650 mg, Rectal, Q6H PRN, [name], MD .  baclofen (LIORESAL) tablet 20 mg, 20 mg, Oral, TID, Kirsteins, Luanna Salk, MD, 20 mg at 08/16/18 1148 .  cefTRIAXone (ROCEPHIN) 1 g in sodium chloride 0.9 % 100 mL IVPB, 1 g, Intravenous, Q24H, Diallo, Abdoulaye, MD, Last Rate: 200 mL/hr at 08/15/18 1551, 1 g at 08/15/18 1551 .  clindamycin (CLEOCIN) capsule 600 mg, 600 mg, Oral, Q8H, Diallo, Abdoulaye, MD, 600 mg at 08/16/18 1148 .  collagenase (SANTYL) ointment, , Topical, Daily, Saverio Danker, PA-C .  feeding supplement (ENSURE ENLIVE) (ENSURE ENLIVE) liquid 237 mL, 237 mL, Oral, BID BM, Hensel, Jamal Collin, MD, 237 mL at 08/15/18 1102 .  ferrous sulfate tablet 325 mg, 325 mg, Oral, Q48H, Guadalupe [name], MD, 325 mg at 08/15/18 1555 .  fluticasone (FLONASE) 50 MCG/ACT nasal spray 2 spray, 2 spray, Each Nare, Daily, [name], MD, 2 spray at 08/16/18 1155 .  Glycerin (Adult) 2.1 g suppository 1 suppository, 1 suppository, Rectal, QHS, Meccariello, Bernita Raisin, DO, 1 suppository at 08/15/18 2142 .  levothyroxine (SYNTHROID) tablet 150 mcg, 150 mcg, Oral, Q0600, [name], MD, 150 mcg at 08/16/18 [phone] .  loratadine (CLARITIN) tablet 10 mg, 10 mg, Oral, Daily, [name], MD, 10 mg at 08/16/18 1149 .  multivitamin with minerals tablet 1 tablet, 1 tablet, Oral, Daily, [name], MD, 1 tablet at 08/16/18 1149 .  nutrition supplement (JUVEN) (JUVEN)  powder packet 1 packet, 1 packet, Oral, BID BM, Hensel, Jamal Collin, MD, 1 packet at 08/16/18 1150 .  ondansetron (ZOFRAN-ODT) disintegrating tablet 4 mg, 4 mg, Oral, Q8H PRN, Martyn Malay, MD, 4 mg at 08/15/18 0841 .  oxyCODONE (Oxy IR/ROXICODONE) immediate release tablet 5 mg, 5 mg, Oral, Q6H PRN, [name], MD, 5 mg at 08/16/18 1008 .  pantoprazole (PROTONIX) EC tablet 40 mg,  40 mg, Oral, Daily, [name], MD, 40 mg at 08/16/18 1149 .  polyethylene glycol (MIRALAX / GLYCOLAX) packet 17 g, 17 g, Oral, Daily, Meccariello, Bailey J, DO, 17 g at 08/16/18 1149 .  promethazine (PHENERGAN) tablet 12.5 mg, 12.5 mg, Oral, Q6H PRN, [name], MD, 12.5 mg at 08/16/18 2683 .  rivaroxaban (XARELTO) tablet 20 mg, 20 mg, Oral, Daily, Diallo, Abdoulaye, MD, 20 mg at 08/16/18 1149 .  senna (SENOKOT) tablet 8.6 mg, 1 tablet, Oral, QHS PRN, Meccariello, Bernita Raisin, DO .  sertraline (ZOLOFT) tablet 50 mg, 50 mg, Oral, Daily, [name], MD, 50 mg at 08/16/18 1148 .  silver nitrate applicators applicator 1 application, 1 application, Topical, Once, Rayburn, Kelly A, PA-C .  sodium chloride flush (NS) 0.9 % injection 3 mL, 3 mL, Intravenous, Q12H, [name], MD, 3 mL at 08/16/18 1156 .  sodium chloride flush (NS) 0.9 % injection 3 mL, 3 mL, Intravenous, PRN, [name], MD  Patients Current Diet:  Diet Order            Diet regular Room service appropriate? Yes; Fluid consistency: Thin  Diet effective now              Precautions / Restrictions Precautions Precautions: Fall Precaution Comments: Watch for shear with transfers Restrictions Weight Bearing Restrictions: No   Has the patient had 2 or more falls or a fall with injury in the past year?No  Prior Activity Level Limited Community (1-2x/wk): went out for appointments, did not drive, recently purchased a w/c accessible Lucianne Lei because car transfers were too challenging  Prior Functional Level Prior Function Level  of Independence: Needs assistance Gait / Transfers Assistance Needed: nonambulatory, uses sliding board with 1 person assist for bed<>w/c transfers ADL's / Homemaking Assistance Needed: Requires "a lot" of help from family  Comments: pt has been non-ambulatory about 1 year, was on CIR in July 2019  Self Care: Did the patient need help bathing, dressing, using the toilet or eating?  Needed some help  Indoor Mobility: Did the patient need assistance with walking from room to room (with or without device)? Dependent/non-ambulatory  Stairs: Did the patient need assistance with internal or external stairs (with or without device)? Dependent  Functional Cognition: Did the patient need help planning regular tasks such as shopping or remembering to take medications? Independent  Development worker, international aid / Equipment Home Assistive Devices/Equipment: Transport planner, Wheelchair Home Equipment: Environmental consultant - 2 wheels, Bedside commode, Shower seat, Wheelchair - power, Wheelchair - manual, Hospital bed, Other (comment)  Prior Device Use: Indicate devices/aids used by the patient prior to current illness, exacerbation or injury? Manual wheelchair and Motorized wheelchair or scooter  Current Functional Level Cognition  Overall Cognitive Status: Within Functional Limits for tasks assessed    Extremity Assessment (includes Sensation/Coordination)  Upper Extremity Assessment: Generalized weakness LUE Deficits / Details: Pt moves cautiously with Lt UE due to pain   Lower Extremity Assessment: Defer to PT evaluation    ADLs  Overall ADL's : Needs assistance/impaired Eating/Feeding: Independent Grooming: Wash/dry hands, Wash/dry face, Oral care, Brushing hair, Set up, Bed level Upper Body Bathing: Minimal assistance, Bed level Upper Body Bathing Details (indicate cue type and reason): assist due to pain  Lower Body Bathing: Maximal assistance, Bed level Upper Body Dressing : Maximal assistance, Bed  level Lower Body Dressing: Total assistance, Bed level Toilet Transfer: Total assistance Toilet Transfer Details (indicate cue type and reason): does not perform at home.  performs bowel program supine  Toileting- Clothing Manipulation and Hygiene: Total assistance, Bed level Functional mobility during ADLs: Maximal assistance, +2 for physical assistance General ADL Comments: able to provide instruction and guidance for how to assist her with transfers    Mobility  Overal bed mobility: Needs Assistance Bed Mobility: Rolling, Sit to Sidelying Rolling: Min assist Sidelying to sit: Mod assist, +2 for physical assistance Sit to sidelying: Mod assist, +2 for physical assistance General bed mobility comments: Pt required assistance to lower trunk and lift B LEs back into bed against gravity.      Transfers  Overall transfer level: Needs assistance Equipment used: Sliding board Transfers: Lateral/Scoot Transfers  Lateral/Scoot Transfers: Mod assist General transfer comment: Pt able to scoot back to bed with mod +1 and use of bed pad.  PTA continued to give total assistance for set up and placement of board.      Ambulation / Gait / Stairs / Office manager / Balance Dynamic Sitting Balance Sitting balance - Comments: decreased core strength and stability to sit independently at EOB.  Balance Overall balance assessment: Needs assistance Sitting balance-Leahy Scale: Poor Sitting balance - Comments: decreased core strength and stability to sit independently at EOB.     Special needs/care consideration BiPAP/CPAP no CPM no Continuous Drip IV no Dialysis no        Days n/a Life Vest no Oxygen no  Special Bed air mattress Trach Size Wound Vac (area) no      Location n/a Skin large decubitus ulcers on bilateral ischial tuberosities                          Bowel mgmt: bowel program in PM  Bladder mgmt: I/O caths at baseline Diabetic mgmt no Behavioral consideration  no Chemo/radiation no     Previous Home Environment (from acute therapy documentation) Living Arrangements: Other relatives Available Help at Discharge: Available PRN/intermittently Type of Home: House Home Layout: One level Home Access: Ramped entrance Bathroom Shower/Tub: Chiropodist: Handicapped height Bathroom Accessibility: No Home Care Services: No Additional Comments: Bathroom is not accessible.  Pt sponge bathes and performs bowel program supine.  uses sliding board for transfers to power wheelchair  Discharge Living Setting Plans for Discharge Living Setting: Patient's home, House Type of Home at Discharge: House Discharge Home Layout: One level Discharge Home Access: [place] entrance Discharge Bathroom Shower/Tub: [place] unit Discharge Bathroom Toilet: Handicapped height Discharge Bathroom Accessibility: No Does the patient have any problems obtaining your medications?: No  Social/Family/Support Systems Anticipated Caregiver: pt's sister/brother in law Anticipated Caregiver's Contact Information: [phone] Ability/Limitations of Caregiver: has been providing assist for slide board transfers since August of 2019 Caregiver Availability: Intermittent Discharge Plan Discussed with Primary Caregiver: Leylah has been in contact with her sister Is Caregiver In Agreement with Plan?: Yes Does Caregiver/Family have Issues with Lodging/Transportation while Pt is in Rehab?: No   Goals/Additional Needs Patient/Family Goal for Rehab: PT/OT min to mod assist Expected length of stay: 17-20 days Dietary Needs: regular, thin Equipment Needs: tbd Special Service Needs: hydrotherapy 6x/week Pt/Family Agrees to Admission and willing to participate: Yes Program Orientation Provided & Reviewed with Pt/Caregiver Including Roles  & Responsibilities: Yes    Possible need for SNF placement upon discharge: possibly   Patient Condition: This patient's medical  and functional status has changed since the consult dated: 5/20 in which the Rehabilitation Physician determined and documented that  the patient's condition is appropriate for intensive rehabilitative care in an inpatient rehabilitation facility. See "History of Present Illness" (above) for medical update. Functional changes are: pt currently mod/max for slide board transfers. Patient's medical and functional status update has been discussed with the Rehabilitation physician and patient remains appropriate for inpatient rehabilitation. Will admit to inpatient rehab today.  Preadmission Screen Completed By:  Michel Santee, PT, DPT 08/16/2018 2:30 PM ______________________________________________________________________   Discussed status with Dr. Naaman Plummer on 08/16/18 at 2:39 PM  and received approval for admission today.  Admission Coordinator:  Michel Santee, PT, DPT time 2:39 PM Sudie Grumbling 08/16/18

## 2018-08-16 NOTE — Progress Notes (Signed)
Family Medicine Teaching Service Daily Progress Note Intern Pager: [phone]  Patient name: Sarah [name] Medical record number: [id number] Date of birth: 1965/03/28 Age: 53 y.o. Gender: female  Primary Care Provider: [name], MD Consultants: gen surg Code Status: full  Pt Overview and Major Events to Date:    Assessment and Plan: [name] a 53 y.o.femalepresenting with worsening of gluteal wound and fever concerning for bacterial wound infection. PMH is significant for paraplegia, metastatic cancer, GERD, depression,   Fever 2/2 b/l stage IV inferior gluteal decub ulcers Debridement on 5/19.  S/P vancomycin cefepime and Flagyl, stopped 5/24. First BC GPC 1/4 bottles. Second set NGTD.Patient also found to have Klebsiella UTI. Finding on CT pelvis suggestive of possible gas or decubitus. switched to clindamycin to cover for clostridium perfringens given gas in wound on 5/24.  has remained afebrile with no leukocytosis since admission. -Follow-up on general surgery consult, appreciate recs -clindamycin600mg  tid, finish on 5/28.  -cont Xarelto20 mg daily, restarted 5/24 -Follow-up on repeat blood cx -Wound care -Phenergan p.o. as needed (QTc)  Paraplegia 2/2 metastatichurthle cell carcinoma- thyroidectomy in 2015. Recent craniotomy in march. 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 She is incontinent of bowel and bladder. She takes baclofen for muscle spasms and synthroid for postop hypothyroidism.  - continue baclofen - continue synthroid - PT/OT: CIR, have been consulted. Awaiting placement - wound care consult  Normocytic Anemia Has had previous transfusions and GI evaluation that included FOBT during 02/2018 that was negative, did not receive scope at that time due to comorbidities. Transfusion threshold 7. Prior iron panel has suggested IDA with low iron, although low TIBC, but also low sat. Hgb stable at 8.9this AM, stable. - Follow up on am  CBC -Xarelto20 mg daily, restarted 5/24 - s/p 1 unit pRBC -Ferrous sulfate QOD - outpatient GI f/u  Constipation  Nausea Large stoolburden notedon imaging. Patient has a home bowel regimen with miralax and senna. Nausea has improved.  Several loose stools noted yesterday.   - Discontinued glycerin and miralax.   -Continue senna PRN - phenergan PO prn nausea  History of DVT w/ PE [name] on xarelto long term for hx of PE. No current increased risk of bleeding.Hemoglobin continues to be stable. - cont Xarelto, restarted 5/24 --Monitor for signs of bleeding  Vaginal candidiasis Multiple recent office visits for vaginal candidiasis and candidal intertrigo. She has been using nystatin powder.Patient wheelchair-bound with history of incontinence. We will continue to monitor and treat as needed  GERD - takes pantoprazole 40mg  at home.  - continue home meds  Depression - takes zoloft 50mg  at home.  - continue home meds  Hypothyroidism - 2/2 thyroid cancer and thyroidectomy. Takes 170mcg daily at home - continue home meds.   Seasonal allergies - patient takes flonase and zyrtec at home.  - continue flonase - claritin on forumulary  FEN/GI: Regular UKG:URKYHCW  Disposition: CIR  Subjective:  Patient having mild nausea this morning after taking her abx on an empty stomach.  She has no other complaints other than her chronic breast pain.   Objective: Temp:  [97.6 F (36.4 C)-97.9 F (36.6 C)] 97.9 F (36.6 C) (05/25 1948) Pulse Rate:  [53-66] 62 (05/25 1948) Resp:  [18-20] 20 (05/25 1948) BP: (108-120)/(68-76) 120/68 (05/25 1948) SpO2:  [97 %-100 %] 97 % (05/25 1948) Physical Exam: General: alert and oriented. No acute distress.  Cardiovascular: Regular rhythm. Normal rate. No murmurs.  Respiratory: LCTAB. No wheezes or crackles.  Abdomen: very mild diffuse abdominal tenderness.   Extremities: no edema.   Laboratory: Recent Labs  Lab  08/14/18 0510 08/15/18 0811 08/16/18 0438  WBC 6.2 4.5 4.3  HGB 8.7* 8.0* 8.5*  HCT 27.5* 26.0* 27.1*  PLT 258 212 226   Recent Labs  Lab 08/13/18 0422 08/14/18 0510 08/15/18 0811  NA 140 141 140  K 3.9 4.5 3.8  CL 105 106 106  CO2 23 19* 20*  BUN 9 13 13   CREATININE 1.24* 1.18* 1.12*  CALCIUM 7.7* 7.9* 7.8*  GLUCOSE 75 74 71    Sarah Pike, MD 08/16/2018, 10:01 PM PGY-1, [place] Intern pager: [phone], text pages welcome

## 2018-08-16 NOTE — Progress Notes (Signed)
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 Performed BLE PROM/AAROM stretching, then assisted patient with trunk control and activation exercises as well as BUE exercise. Tolerated well. Session limited due to arrival of dinner. Will continue to see and progress as tolerated.   Follow Up Recommendations  CIR     Equipment Recommendations  Other (comment)    Recommendations for Other Services       Precautions / Restrictions Precautions Precautions: Fall Precaution Comments: Watch for shear with transfers    Mobility  Bed Mobility Overal bed mobility: Needs Assistance Bed Mobility: Rolling Rolling: Min assist         General bed mobility comments: +2 assist to reposition after min assist for rolling  Transfers                 General transfer comment: deferred due to arrival of meal  Ambulation/Gait                 Stairs             Wheelchair Mobility    Modified Rankin (Stroke Patients Only)       Balance                                            Cognition Arousal/Alertness: Awake/alert Behavior During Therapy: WFL for tasks assessed/performed Overall Cognitive Status: Within Functional Limits for tasks assessed                                        Exercises General Exercises - Lower Extremity Ankle Circles/Pumps: PROM;Both;10 reps Heel Slides: PROM;Both;10 reps Hip ABduction/ADduction: PROM;Both;10 reps Heel Raises: PROM;Both;10  reps Other Exercises Other Exercises: Trunk work (abdominal crunch tri planer 10 reps flexion/ lateral rotation L/ Lateral rotation R Other Exercises: Weight shifts Other Exercises: modified chair press up in bed    General Comments        Pertinent Vitals/Pain Pain Assessment: Faces Faces Pain Scale: Hurts even more Pain Location: back  Pain Descriptors / Indicators: Grimacing;Guarding;Throbbing Pain Intervention(s): Monitored during session;RN gave pain meds during session    Home Living                      Prior Function            PT Goals (current goals can now be found in the care plan section) Acute Rehab PT Goals Patient Stated Goal: to get wounds resolved, decrease pain in chest and return home  PT Goal Formulation: With patient Time For Goal Achievement: 08/24/18 Potential to Achieve Goals: Good    Frequency    Min 3X/week      PT Plan      Co-evaluation              AM-PAC PT "6 Clicks" Mobility   Outcome Measure  Help needed turning from your back to your side while in a flat bed without using bedrails?: A Lot Help needed moving from lying on your back to sitting on the side of a flat bed without using bedrails?: A Lot Help needed moving to and from a bed to a chair (including a wheelchair)?: A Lot Help needed standing up from a chair using your arms (e.g., wheelchair or bedside chair)?: Total Help needed to walk in hospital room?: Total Help needed climbing 3-5 steps with a railing? : Total 6 Click Score: 9    End of Session   Activity Tolerance: Patient tolerated treatment well Patient left: in bed;with call bell/phone within reach Nurse Communication: Mobility status PT Visit Diagnosis: Other abnormalities of gait and mobility (R26.89);Muscle weakness (generalized) (M62.81);Other (comment)     Time: 1640-1700 PT Time Calculation (min) (ACUTE ONLY): 20 min  Charges:  $Therapeutic Exercise: 8-22 mins                      Alben Deeds, PT DPT  Board Certified Neurologic Specialist Acute Rehabilitation Services Pager [phone] Office [place] 08/16/2018, 5:08 PM

## 2018-08-17 ENCOUNTER — Encounter (HOSPITAL_COMMUNITY): Payer: Self-pay

## 2018-08-17 ENCOUNTER — Inpatient Hospital Stay (HOSPITAL_COMMUNITY)
Admission: RE | Admit: 2018-08-17 | Discharge: 2018-09-03 | DRG: 945 | Disposition: A | Discharge status: Skilled Nursing Facility | Payer: Medicare Other | Source: Intra-hospital | Attending: Physical Medicine & Rehabilitation | Admitting: Physical Medicine & Rehabilitation

## 2018-08-17 ENCOUNTER — Other Ambulatory Visit: Payer: Self-pay

## 2018-08-17 DIAGNOSIS — D638 Anemia in other chronic diseases classified elsewhere: Secondary | ICD-10-CM | POA: Diagnosis present

## 2018-08-17 DIAGNOSIS — E46 Unspecified protein-calorie malnutrition: Secondary | ICD-10-CM | POA: Diagnosis not present

## 2018-08-17 DIAGNOSIS — G43909 Migraine, unspecified, not intractable, without status migrainosus: Secondary | ICD-10-CM | POA: Diagnosis not present

## 2018-08-17 DIAGNOSIS — L89153 Pressure ulcer of sacral region, stage 3: Secondary | ICD-10-CM

## 2018-08-17 DIAGNOSIS — G4700 Insomnia, unspecified: Secondary | ICD-10-CM | POA: Diagnosis present

## 2018-08-17 DIAGNOSIS — F419 Anxiety disorder, unspecified: Secondary | ICD-10-CM | POA: Diagnosis present

## 2018-08-17 DIAGNOSIS — C7931 Secondary malignant neoplasm of brain: Secondary | ICD-10-CM | POA: Diagnosis not present

## 2018-08-17 DIAGNOSIS — E8809 Other disorders of plasma-protein metabolism, not elsewhere classified: Secondary | ICD-10-CM

## 2018-08-17 DIAGNOSIS — F329 Major depressive disorder, single episode, unspecified: Secondary | ICD-10-CM | POA: Diagnosis present

## 2018-08-17 DIAGNOSIS — G5793 Unspecified mononeuropathy of bilateral lower limbs: Secondary | ICD-10-CM | POA: Diagnosis present

## 2018-08-17 DIAGNOSIS — Z79899 Other long term (current) drug therapy: Secondary | ICD-10-CM

## 2018-08-17 DIAGNOSIS — Z6831 Body mass index (BMI) 31.0-31.9, adult: Secondary | ICD-10-CM

## 2018-08-17 DIAGNOSIS — G959 Disease of spinal cord, unspecified: Secondary | ICD-10-CM | POA: Diagnosis present

## 2018-08-17 DIAGNOSIS — K5900 Constipation, unspecified: Secondary | ICD-10-CM | POA: Diagnosis not present

## 2018-08-17 DIAGNOSIS — G8221 Paraplegia, complete: Secondary | ICD-10-CM | POA: Diagnosis present

## 2018-08-17 DIAGNOSIS — L89324 Pressure ulcer of left buttock, stage 4: Secondary | ICD-10-CM | POA: Diagnosis not present

## 2018-08-17 DIAGNOSIS — G8929 Other chronic pain: Secondary | ICD-10-CM | POA: Diagnosis not present

## 2018-08-17 DIAGNOSIS — R739 Hyperglycemia, unspecified: Secondary | ICD-10-CM | POA: Diagnosis not present

## 2018-08-17 DIAGNOSIS — G8220 Paraplegia, unspecified: Secondary | ICD-10-CM

## 2018-08-17 DIAGNOSIS — M549 Dorsalgia, unspecified: Secondary | ICD-10-CM | POA: Diagnosis not present

## 2018-08-17 DIAGNOSIS — E441 Mild protein-calorie malnutrition: Secondary | ICD-10-CM | POA: Diagnosis not present

## 2018-08-17 DIAGNOSIS — N183 Chronic kidney disease, stage 3 unspecified: Secondary | ICD-10-CM

## 2018-08-17 DIAGNOSIS — Z86711 Personal history of pulmonary embolism: Secondary | ICD-10-CM | POA: Diagnosis not present

## 2018-08-17 DIAGNOSIS — K219 Gastro-esophageal reflux disease without esophagitis: Secondary | ICD-10-CM | POA: Diagnosis not present

## 2018-08-17 DIAGNOSIS — Z833 Family history of diabetes mellitus: Secondary | ICD-10-CM

## 2018-08-17 DIAGNOSIS — L89154 Pressure ulcer of sacral region, stage 4: Secondary | ICD-10-CM | POA: Diagnosis not present

## 2018-08-17 DIAGNOSIS — M6281 Muscle weakness (generalized): Secondary | ICD-10-CM | POA: Diagnosis not present

## 2018-08-17 DIAGNOSIS — C7951 Secondary malignant neoplasm of bone: Secondary | ICD-10-CM | POA: Diagnosis present

## 2018-08-17 DIAGNOSIS — M2550 Pain in unspecified joint: Secondary | ICD-10-CM | POA: Diagnosis not present

## 2018-08-17 DIAGNOSIS — K592 Neurogenic bowel, not elsewhere classified: Secondary | ICD-10-CM | POA: Diagnosis not present

## 2018-08-17 DIAGNOSIS — I951 Orthostatic hypotension: Secondary | ICD-10-CM | POA: Diagnosis not present

## 2018-08-17 DIAGNOSIS — Z9049 Acquired absence of other specified parts of digestive tract: Secondary | ICD-10-CM | POA: Diagnosis not present

## 2018-08-17 DIAGNOSIS — L89319 Pressure ulcer of right buttock, unspecified stage: Secondary | ICD-10-CM | POA: Diagnosis not present

## 2018-08-17 DIAGNOSIS — I129 Hypertensive chronic kidney disease with stage 1 through stage 4 chronic kidney disease, or unspecified chronic kidney disease: Secondary | ICD-10-CM | POA: Diagnosis not present

## 2018-08-17 DIAGNOSIS — H9202 Otalgia, left ear: Secondary | ICD-10-CM | POA: Diagnosis not present

## 2018-08-17 DIAGNOSIS — Z7901 Long term (current) use of anticoagulants: Secondary | ICD-10-CM

## 2018-08-17 DIAGNOSIS — Z85850 Personal history of malignant neoplasm of thyroid: Secondary | ICD-10-CM

## 2018-08-17 DIAGNOSIS — N319 Neuromuscular dysfunction of bladder, unspecified: Secondary | ICD-10-CM

## 2018-08-17 DIAGNOSIS — L89610 Pressure ulcer of right heel, unstageable: Secondary | ICD-10-CM | POA: Diagnosis not present

## 2018-08-17 DIAGNOSIS — Z823 Family history of stroke: Secondary | ICD-10-CM

## 2018-08-17 DIAGNOSIS — G909 Disorder of the autonomic nervous system, unspecified: Secondary | ICD-10-CM | POA: Diagnosis present

## 2018-08-17 DIAGNOSIS — D631 Anemia in chronic kidney disease: Secondary | ICD-10-CM | POA: Diagnosis not present

## 2018-08-17 DIAGNOSIS — R601 Generalized edema: Secondary | ICD-10-CM | POA: Diagnosis not present

## 2018-08-17 DIAGNOSIS — N390 Urinary tract infection, site not specified: Secondary | ICD-10-CM | POA: Diagnosis present

## 2018-08-17 DIAGNOSIS — D649 Anemia, unspecified: Secondary | ICD-10-CM

## 2018-08-17 DIAGNOSIS — C73 Malignant neoplasm of thyroid gland: Secondary | ICD-10-CM | POA: Diagnosis not present

## 2018-08-17 DIAGNOSIS — E039 Hypothyroidism, unspecified: Secondary | ICD-10-CM | POA: Diagnosis present

## 2018-08-17 DIAGNOSIS — Z91030 Bee allergy status: Secondary | ICD-10-CM

## 2018-08-17 DIAGNOSIS — R5381 Other malaise: Secondary | ICD-10-CM | POA: Diagnosis not present

## 2018-08-17 DIAGNOSIS — Z888 Allergy status to other drugs, medicaments and biological substances status: Secondary | ICD-10-CM

## 2018-08-17 DIAGNOSIS — M4714 Other spondylosis with myelopathy, thoracic region: Secondary | ICD-10-CM | POA: Diagnosis not present

## 2018-08-17 DIAGNOSIS — L89314 Pressure ulcer of right buttock, stage 4: Secondary | ICD-10-CM | POA: Diagnosis not present

## 2018-08-17 DIAGNOSIS — I959 Hypotension, unspecified: Secondary | ICD-10-CM | POA: Diagnosis not present

## 2018-08-17 DIAGNOSIS — I9589 Other hypotension: Secondary | ICD-10-CM | POA: Diagnosis not present

## 2018-08-17 DIAGNOSIS — R0981 Nasal congestion: Secondary | ICD-10-CM

## 2018-08-17 DIAGNOSIS — Z981 Arthrodesis status: Secondary | ICD-10-CM

## 2018-08-17 DIAGNOSIS — Z79891 Long term (current) use of opiate analgesic: Secondary | ICD-10-CM

## 2018-08-17 DIAGNOSIS — E669 Obesity, unspecified: Secondary | ICD-10-CM | POA: Diagnosis present

## 2018-08-17 DIAGNOSIS — Z7401 Bed confinement status: Secondary | ICD-10-CM | POA: Diagnosis not present

## 2018-08-17 DIAGNOSIS — Z79890 Hormone replacement therapy (postmenopausal): Secondary | ICD-10-CM

## 2018-08-17 DIAGNOSIS — B961 Klebsiella pneumoniae [K. pneumoniae] as the cause of diseases classified elsewhere: Secondary | ICD-10-CM | POA: Diagnosis present

## 2018-08-17 DIAGNOSIS — Z885 Allergy status to narcotic agent status: Secondary | ICD-10-CM

## 2018-08-17 DIAGNOSIS — J189 Pneumonia, unspecified organism: Secondary | ICD-10-CM | POA: Diagnosis not present

## 2018-08-17 DIAGNOSIS — L89303 Pressure ulcer of unspecified buttock, stage 3: Secondary | ICD-10-CM | POA: Diagnosis not present

## 2018-08-17 DIAGNOSIS — Z8249 Family history of ischemic heart disease and other diseases of the circulatory system: Secondary | ICD-10-CM

## 2018-08-17 LAB — BASIC METABOLIC PANEL
Anion gap: 10 (ref 5–15)
BUN: 9 mg/dL (ref 6–20)
CO2: 24 mmol/L (ref 22–32)
Calcium: 8.1 mg/dL — ABNORMAL LOW (ref 8.9–10.3)
Chloride: 103 mmol/L (ref 98–111)
Creatinine, Ser: 1.11 mg/dL — ABNORMAL HIGH (ref 0.44–1.00)
GFR calc Af Amer: >60 mL/min (ref >60)
GFR calc non Af Amer: 57 mL/min — ABNORMAL LOW (ref >60)
Glucose, Bld: 70 mg/dL (ref 70–99)
Potassium: 3.7 mmol/L (ref 3.5–5.1)
Sodium: 137 mmol/L (ref 135–145)

## 2018-08-17 LAB — CULTURE, BLOOD (ROUTINE X 2)
Culture: NO GROWTH
Culture: NO GROWTH
Special Requests: ADEQUATE
Special Requests: ADEQUATE

## 2018-08-17 LAB — CBC
HCT: 28 % — ABNORMAL LOW (ref 36.0–46.0)
Hemoglobin: 8.9 g/dL — ABNORMAL LOW (ref 12.0–15.0)
MCH: 28.7 pg (ref 26.0–34.0)
MCHC: 31.8 g/dL (ref 30.0–36.0)
MCV: 90.3 fL (ref 80.0–100.0)
Platelets: 217 10*3/uL (ref 150–400)
RBC: 3.1 MIL/uL — ABNORMAL LOW (ref 3.87–5.11)
RDW: 14.5 % (ref 11.5–15.5)
WBC: 4.5 10*3/uL (ref 4.0–10.5)
nRBC: 0 % (ref 0.0–0.2)

## 2018-08-17 MED ORDER — ONDANSETRON 4 MG PO TBDP
4.0000 mg | ORAL_TABLET | Freq: Three times a day (TID) | ORAL | Status: DC | PRN
  Administered 2018-08-18 – 2018-09-03 (×4): 4 mg via ORAL
  Filled 2018-08-17 (×7): qty 1

## 2018-08-17 MED ORDER — CLINDAMYCIN HCL 300 MG PO CAPS: 600.0000 mg | ORAL_CAPSULE | Freq: Three times a day (TID) | ORAL | 0 refills | Status: DC

## 2018-08-17 MED ORDER — ACETAMINOPHEN 650 MG RE SUPP: 650.0000 mg | Freq: Four times a day (QID) | RECTAL | Status: DC | PRN

## 2018-08-17 MED ORDER — ADULT MULTIVITAMIN W/MINERALS CH
1.0000 | ORAL_TABLET | Freq: Every day | ORAL | Status: DC
  Administered 2018-08-18 – 2018-09-03 (×17): 1 via ORAL
  Filled 2018-08-17 (×18): qty 1

## 2018-08-17 MED ORDER — PROMETHAZINE HCL 12.5 MG PO TABS: 12.5000 mg | ORAL_TABLET | Freq: Four times a day (QID) | ORAL | 0 refills | Status: DC | PRN

## 2018-08-17 MED ORDER — FERROUS SULFATE 325 (65 FE) MG PO TABS: 325.0000 mg | ORAL_TABLET | ORAL | 3 refills | Status: AC

## 2018-08-17 MED ORDER — COLLAGENASE 250 UNIT/GM EX OINT
TOPICAL_OINTMENT | Freq: Every day | CUTANEOUS | Status: DC
  Administered 2018-08-18 – 2018-08-20 (×3): via TOPICAL
  Filled 2018-08-17: qty 30

## 2018-08-17 MED ORDER — OXYCODONE HCL 5 MG PO TABS
5.0000 mg | ORAL_TABLET | Freq: Four times a day (QID) | ORAL | Status: DC | PRN
  Administered 2018-08-17 – 2018-09-03 (×44): 5 mg via ORAL
  Filled 2018-08-17 (×45): qty 1

## 2018-08-17 MED ORDER — RIVAROXABAN 20 MG PO TABS
20.0000 mg | ORAL_TABLET | Freq: Every day | ORAL | Status: DC
  Administered 2018-08-18 – 2018-09-03 (×17): 20 mg via ORAL
  Filled 2018-08-17 (×17): qty 1

## 2018-08-17 MED ORDER — RIVAROXABAN 20 MG PO TABS: 20.0000 mg | ORAL_TABLET | Freq: Every day | ORAL | Status: DC

## 2018-08-17 MED ORDER — BACLOFEN 20 MG PO TABS
20.0000 mg | ORAL_TABLET | Freq: Three times a day (TID) | ORAL | Status: DC
  Administered 2018-08-17 – 2018-08-22 (×16): 20 mg via ORAL
  Administered 2018-08-23: 10 mg via ORAL
  Filled 2018-08-17 (×17): qty 1

## 2018-08-17 MED ORDER — FLUTICASONE PROPIONATE 50 MCG/ACT NA SUSP
2.0000 | Freq: Every day | NASAL | Status: DC
  Administered 2018-08-18 – 2018-09-03 (×17): 2 via NASAL
  Filled 2018-08-17 (×2): qty 16

## 2018-08-17 MED ORDER — JUVEN PO PACK
1.0000 | PACK | Freq: Two times a day (BID) | ORAL | Status: DC
  Administered 2018-08-18 – 2018-09-03 (×23): 1 via ORAL
  Filled 2018-08-17 (×34): qty 1

## 2018-08-17 MED ORDER — ACETAMINOPHEN 325 MG PO TABS
650.0000 mg | ORAL_TABLET | Freq: Four times a day (QID) | ORAL | Status: DC | PRN
  Administered 2018-08-24: 650 mg via ORAL
  Filled 2018-08-17 (×3): qty 2

## 2018-08-17 MED ORDER — ENSURE ENLIVE PO LIQD
237.0000 mL | Freq: Two times a day (BID) | ORAL | Status: DC
  Administered 2018-08-18 – 2018-08-23 (×5): 237 mL via ORAL

## 2018-08-17 MED ORDER — SERTRALINE HCL 50 MG PO TABS
50.0000 mg | ORAL_TABLET | Freq: Every day | ORAL | Status: DC
  Administered 2018-08-18 – 2018-09-03 (×17): 50 mg via ORAL
  Filled 2018-08-17 (×18): qty 1

## 2018-08-17 MED ORDER — LORATADINE 10 MG PO TABS
10.0000 mg | ORAL_TABLET | Freq: Every day | ORAL | Status: DC
  Administered 2018-08-18 – 2018-09-03 (×17): 10 mg via ORAL
  Filled 2018-08-17 (×18): qty 1

## 2018-08-17 MED ORDER — ENSURE ENLIVE PO LIQD: 237.0000 mL | Freq: Two times a day (BID) | ORAL | 12 refills | Status: DC

## 2018-08-17 MED ORDER — JUVEN PO PACK: 1.0000 | PACK | Freq: Two times a day (BID) | ORAL | 0 refills | Status: DC

## 2018-08-17 MED ORDER — ACETAMINOPHEN 650 MG RE SUPP: 650.0000 mg | Freq: Four times a day (QID) | RECTAL | 0 refills | Status: DC | PRN

## 2018-08-17 MED ORDER — ACETAMINOPHEN 325 MG PO TABS: 650.0000 mg | ORAL_TABLET | Freq: Four times a day (QID) | ORAL | 0 refills | Status: DC | PRN

## 2018-08-17 MED ORDER — FERROUS SULFATE 325 (65 FE) MG PO TABS
325.0000 mg | ORAL_TABLET | ORAL | Status: DC
  Administered 2018-08-19 – 2018-09-02 (×8): 325 mg via ORAL
  Filled 2018-08-17 (×14): qty 1

## 2018-08-17 MED ORDER — COLLAGENASE 250 UNIT/GM EX OINT: TOPICAL_OINTMENT | Freq: Every day | CUTANEOUS | 0 refills | Status: DC

## 2018-08-17 MED ORDER — LEVOTHYROXINE SODIUM 75 MCG PO TABS
150.0000 ug | ORAL_TABLET | Freq: Every day | ORAL | Status: DC
  Administered 2018-08-18 – 2018-09-03 (×17): 150 ug via ORAL
  Filled 2018-08-17 (×18): qty 2

## 2018-08-17 MED ORDER — CLINDAMYCIN HCL 300 MG PO CAPS
600.0000 mg | ORAL_CAPSULE | Freq: Three times a day (TID) | ORAL | Status: DC
  Administered 2018-08-17 – 2018-08-20 (×8): 600 mg via ORAL
  Filled 2018-08-17 (×9): qty 2

## 2018-08-17 MED ORDER — SENNA 8.6 MG PO TABS: 1.0000 | ORAL_TABLET | Freq: Every evening | ORAL | Status: DC | PRN

## 2018-08-17 MED ORDER — PANTOPRAZOLE SODIUM 40 MG PO TBEC
40.0000 mg | DELAYED_RELEASE_TABLET | Freq: Every day | ORAL | Status: DC
  Administered 2018-08-18 – 2018-09-03 (×17): 40 mg via ORAL
  Filled 2018-08-17 (×18): qty 1

## 2018-08-17 NOTE — Progress Notes (Signed)
Pt admitted to room [phone]. Denies any pain or discomfort at this time. Oriented to floor and no fall policy. Wounds assessed and dressings changed. Pt denies pain or discomfort at site. Vital signs stable. Pt in bed with all needs with all needs within reach. Continue plan of care.   Gerald Stabs RN

## 2018-08-17 NOTE — Progress Notes (Signed)
Physical Therapy Wound Treatment Patient Details  Name: Sarah [name] MRN: [id number] Date of Birth: March 17, 1966  Today's Date: 08/17/2018 Time: 1025-1109 Time Calculation (min): 44 min  Subjective  Subjective: Pleasant and agreeable to hydrotherapy Patient and Family Stated Goals: Heal wounds Date of Onset: (unknown) Prior Treatments: Bedside I&D  Pain Score:    Wound Assessment     Pressure Injury 08/10/18 Unstageable - Full thickness tissue loss in which the base of the ulcer is covered by slough (yellow, tan, gray, green or brown) and/or eschar (tan, brown or black) in the wound bed. (Active)  Dressing Type ABD;Barrier Film (skin prep);Moist to dry;Tape dressing 08/17/2018 12:25 PM  Dressing Changed 08/17/2018 12:25 PM  Dressing Change Frequency Daily 08/17/2018 12:25 PM  State of Healing Early/partial granulation 08/17/2018 12:25 PM  Site / Wound Assessment Black;Dusky;Granulation tissue;Yellow 08/17/2018 12:25 PM  % Wound base Red or Granulating 50% 08/17/2018 12:25 PM  % Wound base Yellow/Fibrinous Exudate 45% 08/17/2018 12:25 PM  % Wound base Black/Eschar 5% 08/17/2018 12:25 PM  Peri-wound Assessment Intact;Induration 08/17/2018 12:25 PM  Wound Length (cm) 3.7 cm 08/11/2018  1:36 PM  Wound Width (cm) 4.1 cm 08/11/2018  1:36 PM  Wound Depth (cm) 2.1 cm 08/11/2018  1:36 PM  Wound Surface Area (cm^2) 15.17 cm^2 08/11/2018  1:36 PM  Wound Volume (cm^3) 31.86 cm^3 08/11/2018  1:36 PM  Tunneling (cm) 0 08/11/2018  1:36 PM  Undermining (cm) 0 08/11/2018  1:36 PM  Margins Unattached edges (unapproximated) 08/17/2018 12:25 PM  Drainage Amount Minimal 08/17/2018 12:25 PM  Drainage Description Serosanguineous 08/17/2018 12:25 PM  Treatment Cleansed;Debridement (Selective);Hydrotherapy (Pulse lavage);Packing (Saline gauze) 08/17/2018 12:25 PM     Pressure Injury 08/10/18 Unstageable - Full thickness tissue loss in which the base of the ulcer is covered by slough (yellow, tan, gray, green or brown)  and/or eschar (tan, brown or black) in the wound bed. (Active)  Wound Image   08/11/2018  1:36 PM  Dressing Type ABD;Barrier Film (skin prep);Moist to dry;Tape dressing 08/17/2018 12:25 PM  Dressing Changed 08/17/2018 12:25 PM  Dressing Change Frequency Daily 08/17/2018 12:25 PM  State of Healing Eschar 08/17/2018 12:25 PM  Site / Wound Assessment Black;Pink;Yellow 08/17/2018 12:25 PM  % Wound base Red or Granulating 40% 08/17/2018 12:25 PM  % Wound base Yellow/Fibrinous Exudate 30% 08/17/2018 12:25 PM  % Wound base Black/Eschar 30% 08/17/2018 12:25 PM  Peri-wound Assessment Intact;Induration 08/17/2018 12:25 PM  Wound Length (cm) 8 cm 08/11/2018  1:36 PM  Wound Width (cm) 8.3 cm 08/11/2018  1:36 PM  Wound Depth (cm) 5 cm 08/11/2018  1:36 PM  Wound Surface Area (cm^2) 66.4 cm^2 08/11/2018  1:36 PM  Wound Volume (cm^3) 332 cm^3 08/11/2018  1:36 PM  Tunneling (cm) 0 08/11/2018  1:36 PM  Undermining (cm) 2.5 cm at 11:00 08/11/2018  1:36 PM  Margins Unattached edges (unapproximated) 08/17/2018 12:25 PM  Drainage Amount Moderate 08/17/2018 12:25 PM  Drainage Description Serosanguineous 08/17/2018 12:25 PM  Treatment Cleansed;Debridement (Selective);Hydrotherapy (Pulse lavage);Packing (Saline gauze) 08/17/2018 12:25 PM   Santyl applied to wound bed prior to applying dressing.    Hydrotherapy Pulsed lavage therapy - wound location: Bilateral Ischial Tuberosities Pulsed Lavage with Suction (psi): 12 psi Pulsed Lavage with Suction - Normal Saline Used: 2000 mL Pulsed Lavage Tip: Tip with splash shield Selective Debridement Selective Debridement - Location: Bilateral Ischial Tuberosities Selective Debridement - Tools Used: Forceps;Scissors;Scalpel Selective Debridement - Tissue Removed: Yellow and black unviable tissue   Wound Assessment and Plan  Wound  Therapy - Assess/Plan/Recommendations Wound Therapy - Clinical Statement: Progressing with debridement. Pt with very soft stool several times throughout. May  want to consider a rectal pouch if this continues, to decrease dressing changes and protect wound integrity. Will continue to follow.  Wound Therapy - Functional Problem List: Paraplegia Factors Delaying/Impairing Wound Healing: Altered sensation;Incontinence;Immobility Hydrotherapy Plan: Debridement;Dressing change;Patient/family education;Pulsatile lavage with suction Wound Therapy - Frequency: 6X / week Wound Therapy - Follow Up Recommendations: Other (comment)(PT recommending CIR) Wound Plan: See above  Wound Therapy Goals- Improve the function of patient's integumentary system by progressing the wound(s) through the phases of wound healing (inflammation - proliferation - remodeling) by: Decrease Necrotic Tissue to: 25% Decrease Necrotic Tissue - Progress: Progressing toward goal Increase Granulation Tissue to: 75% Increase Granulation Tissue - Progress: Progressing toward goal Improve Drainage Characteristics: Min;Serous Improve Drainage Characteristics - Progress: Progressing toward goal Goals/treatment plan/discharge plan were made with and agreed upon by patient/family: Yes Time For Goal Achievement: 7 days Wound Therapy - Potential for Goals: Good  Goals will be updated until maximal potential achieved or discharge criteria met.  Discharge criteria: when goals achieved, discharge from hospital, MD decision/surgical intervention, no progress towards goals, refusal/missing three consecutive treatments without notification or medical reason.  GP     Sarah [name] 08/17/2018, 12:29 PM  08/17/2018  Donnella Sham, PT [facility] Services [phone]  (pager) [phone]  (office)

## 2018-08-17 NOTE — Progress Notes (Signed)
Inpatient Rehab Admissions Coordinator:   I have insurance authorization and approval from attending physician for admission to inpatient rehab today.  I let pt know and she states she will contact her sister.  I will let CM and RN know.   Shann Medal, PT, DPT Admissions Coordinator [phone] 08/17/18  11:21 AM

## 2018-08-17 NOTE — H&P (Signed)
Physical Medicine and Rehabilitation Admission H&P        Chief Complaint  Patient presents with  . Mutliple Complaints  Chief complaint: Weakness HPI: Sarah [name]. 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 and has received inpatient rehab services in the past last being 09/21/2017 until 10/30/2017 was discharged to [facility] skilled nursing facility.  Patient has since returned home with her sister and brother-in-law nonambulatory using a sliding board with 1 person assist for bed to wheelchair transfers.  Sister and brother-in-law all assist in all aspects of bathing dressing toileting and functional mobility.  They live in a 1 level home with ramped entrance.  Presented 08/10/2018 with severe sacral decubitus ulcer that began in late February gradually worsened since that time as well as fever of 102.5, WBC 16,800, hemoglobin 6.1.  Initial blood culture from 518 with growth in 1 bottle GPC likely contaminant.  Follow-up blood cultures show no growth to date.  Placed on intravenous broad-spectrum antibiotics and transitioned to p.o clindamycin completed 08/17/2018.  CT the pelvis showed right inferior pubic ramus region gas containing decubitus wound new since February PET/CT but no CT evidence of osteomyelitis.  Underwent bedside debridement per general surgery with hydrotherapy initiated.  Hospital course pain management.  She remains on chronic Xarelto as prior to admission.  Acute on chronic anemia latest hemoglobin 8.5.  Klebsiella UTI completing a course of Rocephin.  Tolerating a regular diet.  Therapy evaluations completed and patient was admitted for a comprehensive rehab program.   Review of  Systems  Constitutional: Positive for fever.  HENT: Negative for hearing loss.   Eyes: Negative for blurred vision and double vision.  Respiratory: Negative for cough and shortness of breath.   Cardiovascular: Positive for leg swelling. Negative for chest pain and palpitations.  Gastrointestinal: Positive for nausea. Negative for heartburn and vomiting.       GERD neurogenic bowel  Genitourinary: Negative for dysuria, flank pain and hematuria.       Neurogenic bladder  Musculoskeletal: Positive for back pain, joint pain and myalgias.  Skin: Negative for rash.  Psychiatric/Behavioral: Positive for depression. The patient has insomnia.        Anxiety  All other systems reviewed and are negative.       Past Medical History:  Diagnosis Date  . Anxiety    . Cancer [facility])      FNA of thyroid positive for onconytic/hurthle cell carcinoma  . Chronic back pain    . Complication of anesthesia      Nausea and vomiting  . DDD (degenerative disc disease), cervical    . Depression    . DJD (degenerative joint disease)    . Family history of adverse reaction to anesthesia      MOTHER HAS NAUSEA   . GERD (gastroesophageal reflux disease)    . History of blood transfusion      08/2017  . History of rectal fissure    . HIT (heparin-induced thrombocytopenia) ([place])    . Hypertension      05/27/2018- not current- runs low  . Hypothyroidism    . Obesity    . Paraplegia at T4 level ([place])    . Pneumonia 03/2018  .  Pulmonary embolus, right ([place]) 2015  . Rotator cuff tendonitis right         Past Surgical History:  Procedure Laterality Date  . APPLICATION OF CRANIAL NAVIGATION N/A 06/01/2018    Procedure: APPLICATION OF CRANIAL NAVIGATION;  Surgeon: Erline Levine, MD;  Location: [place];  Service: Neurosurgery;  Laterality: N/A;  . CHOLECYSTECTOMY N/A 01/25/2018    Procedure: LAPAROSCOPIC CHOLECYSTECTOMY;  Surgeon: Virl Cagey, MD;  Location: [facility];  Service: General;  Laterality: N/A;  .  CRANIOTOMY N/A 06/01/2018    Procedure: Craniectomy for skull metastasis with Brainlab;  Surgeon: Erline Levine, MD;  Location: [place];  Service: Neurosurgery;  Laterality: N/A;  . FEMUR IM NAIL Left 09/11/2017    Procedure: INTRAMEDULLARY (IM) RETROGRADE FEMORAL NAILING;  Surgeon: Altamese [place], MD;  Location: [place];  Service: Orthopedics;  Laterality: Left;  . LAMINECTOMY N/A 12/14/2013    Procedure: THORACIC LAMINECTOMY FOR TUMOR THORACIC THREE;  Surgeon: Ashok Pall, MD;  Location: [place] [facility];  Service: Neurosurgery;  Laterality: N/A;  THORACIC LAMINECTOMY FOR TUMOR THORACIC THREE  . LAMINECTOMY N/A 07/05/2015    Procedure: THORACIC LAMINECTOMY FOR TUMOR;  Surgeon: Ashok Pall, MD;  Location: [place] [facility];  Service: Neurosurgery;  Laterality: N/A;  . LAMINECTOMY N/A 09/03/2017    Procedure: POSTERIOR SPINAL TUMOR RESECTION THORACIC THREE;  Surgeon: Ashok Pall, MD;  Location: [place];  Service: Neurosurgery;  Laterality: N/A;  POSTERIOR SPINAL TUMOR RESECTION THORACIC THREE  . POSTERIOR LUMBAR FUSION 4 LEVEL N/A 12/30/2013    Procedure: Thoracic one-Thoracic five posterior thoracic fusion with pedicle screws;  Surgeon: Ashok Pall, MD;  Location: [place] [facility];  Service: Neurosurgery;  Laterality: N/A;  Thoracic one-Thoracic five posterior thoracic fusion with pedicle screws  . THYROIDECTOMY N/A 01/09/2014    Procedure: TOTAL THYROIDECTOMY;  Surgeon: Izora Gala, MD;  Location: [place];  Service: ENT;  Laterality: N/A;  . TONSILLECTOMY             Family History  Problem Relation Age of Onset  . Hypertension Mother    . Diabetes Mother    . Hypertension Father    . Stroke Father    . Diabetes Father    . Diabetes Sister    . Diabetes Sister      Social History:  reports that she has never smoked. She has never used smokeless tobacco. She reports that she does not drink alcohol or use drugs. Allergies:       Allergies  Allergen Reactions  . Bee Venom Anaphylaxis  . Heparin Other (See  Comments)      Weak positive platelets induced antibodies, SRA negative--2015  . Keflex [Cephalexin] Nausea And Vomiting  . Tramadol Nausea And Vomiting  . Gabapentin Nausea And Vomiting  . Hydrochlorothiazide Nausea And Vomiting  . Hydrocodone Nausea And Vomiting  . Oxycodone-Acetaminophen Nausea And Vomiting  . Sorbitol Nausea Only      And abdominal distress  . Tizanidine        Fatigue            Medications Prior to Admission  Medication Sig Dispense Refill  . acetaminophen (TYLENOL) 325 MG tablet Take 2 tablets (650 mg total) by mouth every 4 (four) hours as needed for mild pain ((score 1 to 3) or temp > 100.5).      [name] Kitchen albuterol (PROVENTIL) (2.5 MG/3ML) 0.083% nebulizer solution Take 3 mLs (2.5 mg total) by nebulization every 6 (six) hours as needed for wheezing or shortness of breath. 75  mL 12  . baclofen (LIORESAL) 20 MG tablet Take 1 tablet (20 mg total) by mouth 2 (two) times daily. 60 tablet 4  . cetirizine (ZYRTEC) 10 MG tablet Take 1 tablet (10 mg total) by mouth daily. 30 tablet 11  . fluticasone (FLONASE) 50 MCG/ACT nasal spray Place 2 sprays into both nostrils daily. 16 g 6  . [EXPIRED] HYDROcodone-acetaminophen (NORCO/VICODIN) 5-325 MG tablet Take 1 tablet by mouth every 8 (eight) hours as needed for up to 10 days for moderate pain. 30 tablet 0  . ipratropium-albuterol (DUONEB) 0.5-2.5 (3) MG/3ML SOLN Take 3 mLs by nebulization 3 (three) times daily. 360 mL    . levothyroxine (SYNTHROID) 150 MCG tablet Take 1 tablet (150 mcg total) by mouth daily before breakfast. 30 tablet 1  . Multiple Vitamin (MULTIVITAMIN WITH MINERALS) TABS tablet Take 1 tablet by mouth daily.      [name] Kitchen nystatin (MYCOSTATIN/NYSTOP) powder Apply topically 4 (four) times daily. To groin. 60 g 3  . ondansetron (ZOFRAN) 4 MG tablet Take 1 tablet by mouth every 6 (six) hours as needed for nausea or vomiting.       [name] Kitchen oxymetazoline (AFRIN NASAL SPRAY) 0.05 % nasal spray Place 1 spray into both nostrils 2 (two)  times daily. 30 mL 0  . pantoprazole (PROTONIX) 40 MG tablet Take 1 tablet (40 mg total) by mouth daily. (Patient taking differently: Take 40 mg by mouth daily. ) 30 tablet 0  . polyethylene glycol (MIRALAX / GLYCOLAX) packet Take 17 g by mouth daily. 14 each 0  . rivaroxaban (XARELTO) 20 MG TABS tablet Take 1 tablet (20 mg total) by mouth daily with supper. 30 tablet 3  . senna (SENOKOT) 8.6 MG TABS tablet Take 1 tablet (8.6 mg total) by mouth at bedtime as needed for mild constipation. 120 each 0  . sertraline (ZOLOFT) 50 MG tablet Take 50 mg by mouth daily.       . fluconazole (DIFLUCAN) 100 MG tablet Take 1 tablet (100 mg total) by mouth daily. 14 tablet 0      Drug Regimen Review Drug regimen was reviewed and remains appropriate with no significant issues identified   Home: Home Living Family/patient expects to be discharged to:: Private residence Living Arrangements: Other relatives Available Help at Discharge: Available PRN/intermittently Type of Home: House Home Access: Ramped entrance Home Layout: One level Bathroom Shower/Tub: Chiropodist: Handicapped height Bathroom Accessibility: No Home Equipment: Environmental consultant - 2 wheels, Bedside commode, Shower seat, Wheelchair - power, Wheelchair - manual, Hospital bed, Other (comment) Additional Comments: Bathroom is not accessible.  Pt sponge bathes and performs bowel program supine.  uses sliding board for transfers to power wheelchair   Functional History: Prior Function Level of Independence: Needs assistance Gait / Transfers Assistance Needed: nonambulatory, uses sliding board with 1 person assist for bed<>w/c transfers ADL's / Homemaking Assistance Needed: Requires "a lot" of help from family  Comments: pt has been non-ambulatory about 1 year, was on CIR in July 2019   Functional Status:  Mobility: Bed Mobility Overal bed mobility: Needs Assistance Bed Mobility: Rolling Rolling: Min assist Sidelying to sit:  Mod assist, +2 for physical assistance Sit to sidelying: Mod assist, +2 for physical assistance General bed mobility comments: +2 assist to reposition after min assist for rolling Transfers Overall transfer level: Needs assistance Equipment used: Sliding board Transfers: Lateral/Scoot Transfers  Lateral/Scoot Transfers: Mod assist General transfer comment: deferred due to arrival of meal   ADL: ADL Overall ADL's : Needs  assistance/impaired Eating/Feeding: Independent Grooming: Wash/dry hands, Wash/dry face, Oral care, Brushing hair, Set up, Bed level Upper Body Bathing: Minimal assistance, Bed level Upper Body Bathing Details (indicate cue type and reason): assist due to pain  Lower Body Bathing: Maximal assistance, Bed level Upper Body Dressing : Maximal assistance, Bed level Lower Body Dressing: Total assistance, Bed level Toilet Transfer: Total assistance Toilet Transfer Details (indicate cue type and reason): does not perform at home.  performs bowel program supine  Toileting- Clothing Manipulation and Hygiene: Total assistance, Bed level Functional mobility during ADLs: Maximal assistance, +2 for physical assistance General ADL Comments: able to provide instruction and guidance for how to assist her with transfers   Cognition: Cognition Overall Cognitive Status: Within Functional Limits for tasks assessed Cognition Arousal/Alertness: Awake/alert Behavior During Therapy: WFL for tasks assessed/performed Overall Cognitive Status: Within Functional Limits for tasks assessed   Physical Exam: Blood pressure 128/81, pulse 64, temperature 98.2 F (36.8 C), temperature source Oral, resp. rate 20, height 5\' 11"  (1.803 m), weight 101.3 kg, last menstrual period 10/22/2016, SpO2 98 %. Physical Exam  Constitutional: She is oriented to person, place, and time. No distress.  Obese  HENT:  Head: Normocephalic and atraumatic.  Eyes: Pupils are equal, round, and reactive to light.  Conjunctivae and EOM are normal.  Neck: Normal range of motion. No tracheal deviation present. No thyromegaly present.  Cardiovascular: Normal rate and regular rhythm. Exam reveals no friction rub.  No murmur heard. Respiratory: Effort normal. No respiratory distress. She has no wheezes. She has no rales.  GI: Soft. She exhibits no distension. There is no abdominal tenderness. There is no rebound.  Musculoskeletal:        General: Edema present.  Neurological: She is alert and oriented to person, place, and time. No cranial nerve deficit.  [name] alert in no acute distress.  Follows full commands.  [place]. UE 4/5. LE: 1-2/5 prox to distal with extensor tone. DTR's 3+. Decreased LT and pain in both legs.   Skin: Skin is warm. She is not diaphoretic.  Patient currently receiving hydrotherapy for sacral decubitus, large stage III+ wounds. Ongoing fibronecrotic debris present but improving  Psychiatric: She has a normal mood and affect. Her behavior is normal.      Lab Results Last 48 Hours  Results for orders placed or performed during the hospital encounter of 08/09/18 (from the past 48 hour(s))  CBC     Status: Abnormal    Collection Time: 08/15/18  8:11 AM  Result Value Ref Range    WBC 4.5 4.0 - 10.5 K/uL    RBC 2.78 (L) 3.87 - 5.11 MIL/uL    Hemoglobin 8.0 (L) 12.0 - 15.0 g/dL    HCT 26.0 (L) 36.0 - 46.0 %    MCV 93.5 80.0 - 100.0 fL    MCH 28.8 26.0 - 34.0 pg    MCHC 30.8 30.0 - 36.0 g/dL    RDW 14.6 11.5 - 15.5 %    Platelets 212 150 - 400 K/uL    nRBC 0.0 0.0 - 0.2 %      Comment: Performed at [facility], [place] [address] [place], [place] [address]  Basic metabolic panel     Status: Abnormal    Collection Time: 08/15/18  8:11 AM  Result Value Ref Range    Sodium 140 135 - 145 mmol/L    Potassium 3.8 3.5 - 5.1 mmol/L    Chloride 106 98 - 111 mmol/L  CO2 20 (L) 22 - 32 mmol/L    Glucose, Bld 71 70 - 99 mg/dL    BUN 13 6 - 20 mg/dL    Creatinine,  Ser 1.12 (H) 0.44 - 1.00 mg/dL    Calcium 7.8 (L) 8.9 - 10.3 mg/dL    GFR calc non Af Amer 56 (L) >60 mL/min    GFR calc Af Amer >60 >60 mL/min    Anion gap 14 5 - 15      Comment: Performed at [facility] [address] [place], [place] [address]  CBC with Differential/Platelet     Status: Abnormal    Collection Time: 08/16/18  4:38 AM  Result Value Ref Range    WBC 4.3 4.0 - 10.5 K/uL    RBC 2.94 (L) 3.87 - 5.11 MIL/uL    Hemoglobin 8.5 (L) 12.0 - 15.0 g/dL    HCT 27.1 (L) 36.0 - 46.0 %    MCV 92.2 80.0 - 100.0 fL    MCH 28.9 26.0 - 34.0 pg    MCHC 31.4 30.0 - 36.0 g/dL    RDW 14.5 11.5 - 15.5 %    Platelets 226 150 - 400 K/uL    nRBC 0.0 0.0 - 0.2 %    Neutrophils Relative % 57 %    Neutro Abs 2.5 1.7 - 7.7 K/uL    Lymphocytes Relative 24 %    Lymphs Abs 1.0 0.7 - 4.0 K/uL    Monocytes Relative 7 %    Monocytes Absolute 0.3 0.1 - 1.0 K/uL    Eosinophils Relative 4 %    Eosinophils Absolute 0.2 0.0 - 0.5 K/uL    Basophils Relative 1 %    Basophils Absolute 0.0 0.0 - 0.1 K/uL    WBC Morphology MILD LEFT SHIFT (1-5% METAS, OCC MYELO, OCC BANDS)      Immature Granulocytes 7 %    Abs Immature Granulocytes 0.30 (H) 0.00 - 0.07 K/uL    Polychromasia PRESENT        Comment: Performed at [facility], [place] [address], [place], [place] [address]  CBC     Status: Abnormal    Collection Time: 08/17/18  4:03 AM  Result Value Ref Range    WBC 4.5 4.0 - 10.5 K/uL    RBC 3.10 (L) 3.87 - 5.11 MIL/uL    Hemoglobin 8.9 (L) 12.0 - 15.0 g/dL    HCT 28.0 (L) 36.0 - 46.0 %    MCV 90.3 80.0 - 100.0 fL    MCH 28.7 26.0 - 34.0 pg    MCHC 31.8 30.0 - 36.0 g/dL    RDW 14.5 11.5 - 15.5 %    Platelets 217 150 - 400 K/uL    nRBC 0.0 0.0 - 0.2 %      Comment: Performed at [facility], [place] [address], [place], [place] [address]  Basic metabolic panel     Status: Abnormal    Collection Time: 08/17/18  4:03 AM  Result Value Ref Range    Sodium 137 135 - 145 mmol/L     Potassium 3.7 3.5 - 5.1 mmol/L    Chloride 103 98 - 111 mmol/L    CO2 24 22 - 32 mmol/L    Glucose, Bld 70 70 - 99 mg/dL    BUN 9 6 - 20 mg/dL    Creatinine, Ser 1.11 (H) 0.44 - 1.00 mg/dL    Calcium 8.1 (L) 8.9 - 10.3 mg/dL  GFR calc non Af Amer 57 (L) >60 mL/min    GFR calc Af Amer >60 >60 mL/min    Anion gap 10 5 - 15      Comment: Performed at [facility] [address] [place], [place] [address]    *Note: Due to a large number of results and/or encounters for the requested time period, some results have not been displayed. A complete set of results can be found in Results Review.      Imaging Results (Last 48 hours)  No results found.           Medical Problem List and Plan: 1.  Debility/paraplegia secondary to sacral decubitus status post debridement with history of metastatic Hurthle cancer followed by Dr.Kale                  -admit to inpatient rehab 2.  Antithrombotics: -DVT/anticoagulation: Chronic Xarelto for history of pulmonary emboli             -antiplatelet therapy: N/A 3. Pain Management: Baclofen 20 mg 3 times daily, oxycodone as needed 4. Mood: Zoloft 50 mg daily             -antipsychotic agents: N/A 5. Neuropsych: This patient is capable of making decisions on her own behalf. 6. Skin/Wound Care: Continue santyl and hydrotherapy. Vac placement when adequate fibronecrotic tissue removal 7. Fluids/Electrolytes/Nutrition: Routine in and outs with follow-up chemistries 8.  Acute on chronic anemia.  Follow-up CBC.  Continue iron supplement 9.  Neurogenic bowel and bladder as well as Klebsiella UTI.  Established bowel program.  Complete course of Rocephin 10.  Hypothyroidism.  Synthroid     Post Admission Physician Evaluation: 1. Functional deficits secondary  to debility, previous thoracic SCI. 2. [name] admitted to receive collaborative, interdisciplinary care between the physiatrist, rehab nursing staff, and therapy team. 3. Patient's level  of medical complexity and substantial therapy needs in context of that medical necessity cannot be provided at a lesser intensity of care such as a SNF. 4. Patient has experienced substantial functional loss from his/her baseline which was documented above under the "Functional History" and "Functional Status" headings.  Judging by the patient's diagnosis, physical exam, and functional history, the patient has potential for functional progress which will result in measurable gains while on inpatient rehab.  These gains will be of substantial and practical use upon discharge  in facilitating mobility and self-care at the household level. 5. Physiatrist will provide 24 hour management of medical needs as well as oversight of the therapy plan/treatment and provide guidance as appropriate regarding the interaction of the two. 6. The Preadmission Screening has been reviewed and patient status is unchanged unless otherwise stated above. 7. 24 hour rehab nursing will assist with bladder management, bowel management, safety, skin/wound care, disease management, medication administration, pain management and patient education  and help integrate therapy concepts, techniques,education, etc. 8. PT will assess and treat for/with: Lower extremity strength, range of motion, stamina, balance, functional mobility, safety, adaptive techniques and equipment, spasticity mgt, w/c assessment.   Goals are: min to mod assist. 9. OT will assess and treat for/with: ADL's, functional mobility, safety, upper extremity strength, adaptive techniques and equipment, NMR.   Goals are: min to mod assist. Therapy may proceed with showering this patient. 10. SLP will assess and treat for/with: n/a.  Goals are: n/a. 11. Case Management and Social Worker will assess and treat for psychological issues and discharge planning. 12. Team conference will be  held weekly to assess progress toward goals and to determine barriers to discharge. 13.  Patient will receive at least 3 hours of therapy per day at least 5 days per week. 14. ELOS: 17-20 days       15. Prognosis:  excellent   I have personally performed a face to face diagnostic evaluation of this patient and formulated the key components of the plan.  Additionally, I have personally reviewed laboratory data, imaging studies, as well as relevant notes and concur with the physician assistant's documentation above.  [name], MD, FAAPMR        Lavon Paganini [place], PA-C 08/17/2018

## 2018-08-17 NOTE — Care Management Important Message (Signed)
Important Message  Patient Details  Name: Sarah [name] MRN: [id number] Date of Birth: 1965/10/05   Medicare Important Message Given:  Yes    Orbie Pyo 08/17/2018, 3:13 PM

## 2018-08-17 NOTE — Progress Notes (Signed)
[name], MD  Physician  Physical Medicine and Rehabilitation  Consult Note  Addendum  Date of Service:  08/11/2018 6:03 AM       Related encounter: ED to Hosp-Admission (Discharged) from 08/09/2018 in [facility]      Expand All Collapse All    Show:Clear all [x] Manual[x] Template[] Copied  Added by: [x] Angiulli, Lavon Paganini, PA-C[x] Kirsteins, Luanna Salk, MD  [] Hover for details      Physical Medicine and Rehabilitation Consult Reason for Consult: Decreased functional mobility Referring Physician: Family medicine   HPI: Sarah [name] is a 53 y.o. 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 and received inpatient rehab services 09/21/2017 until 10/30/2017 and was discharged to [facility] skilled nursing facility.  Patient has since returned home living with her sister and brother-in-law nonambulatory using sliding board with 1 person assist for bed to wheelchair transfers.  Sister and brother-in-law all assist in all aspects of bathing dressing toileting and functional mobility 1 level home with ramped entrance.  Presented 08/10/2018 with severe sacral decubitus ulcers that began in late February and have gradually worsened since that time as well as fever 102.5, WBC 16,800.  Placed on broad-spectrum antibiotics.  CT of the pelvis showed right inferior pubic ramus region gas containing decubitus wound new since February PET/CT but no CT evidence of osteomyelitis.  Underwent bedside debridement per general surgery with hydrotherapy initiated.  Hospital course pain management.  Therapy evaluations completed with recommendations of physical medicine rehab consult.  Patient feels like her arm strength is the same as  before, her leg strength continues to be nonfunctional   Review of Systems  Constitutional: Positive for fever. Negative for chills.  HENT: Negative for hearing loss.   Eyes: Negative for blurred vision and double vision.  Respiratory: Positive for shortness of breath. Negative for cough.   Cardiovascular: Positive for leg swelling. Negative for chest pain and palpitations.  Gastrointestinal: Positive for constipation. Negative for heartburn, nausea and vomiting.       GERD  Genitourinary: Negative for dysuria and hematuria.  Musculoskeletal: Positive for back pain, joint pain and myalgias.  Skin: Negative for rash.  Psychiatric/Behavioral: Positive for depression. The patient has insomnia.        Anxiety  All other systems reviewed and are negative.      Past Medical History:  Diagnosis Date   Anxiety    Cancer ([place])    FNA of thyroid positive for onconytic/hurthle cell carcinoma   Chronic back pain    Complication of anesthesia    Nausea and vomiting   DDD (degenerative disc disease), cervical    Depression    DJD (degenerative joint disease)    Family history of adverse reaction to anesthesia    MOTHER HAS NAUSEA    GERD (gastroesophageal reflux disease)    History of blood transfusion    08/2017   History of rectal fissure    HIT (heparin-induced thrombocytopenia) (HCC)    Hypertension    05/27/2018- not current- runs low   Hypothyroidism    Obesity    Paraplegia at T4 level [facility])    Pneumonia 03/2018   Pulmonary embolus, right ([place]) 2015   Rotator cuff tendonitis right        Past Surgical History:  Procedure Laterality Date  APPLICATION OF CRANIAL NAVIGATION N/A 06/01/2018   Procedure: APPLICATION OF CRANIAL NAVIGATION;  Surgeon: Erline Levine, MD;  Location: [place];  Service: Neurosurgery;  Laterality: N/A;   CHOLECYSTECTOMY N/A 01/25/2018   Procedure: LAPAROSCOPIC CHOLECYSTECTOMY;  Surgeon: Virl Cagey, MD;   Location: [facility];  Service: General;  Laterality: N/A;   CRANIOTOMY N/A 06/01/2018   Procedure: Craniectomy for skull metastasis with Brainlab;  Surgeon: Erline Levine, MD;  Location: [place];  Service: Neurosurgery;  Laterality: N/A;   FEMUR IM NAIL Left 09/11/2017   Procedure: INTRAMEDULLARY (IM) RETROGRADE FEMORAL NAILING;  Surgeon: Altamese Dry Tavern, MD;  Location: [place];  Service: Orthopedics;  Laterality: Left;   LAMINECTOMY N/A 12/14/2013   Procedure: THORACIC LAMINECTOMY FOR TUMOR THORACIC THREE;  Surgeon: Ashok Pall, MD;  Location: [place] [facility];  Service: Neurosurgery;  Laterality: N/A;  THORACIC LAMINECTOMY FOR TUMOR THORACIC THREE   LAMINECTOMY N/A 07/05/2015   Procedure: THORACIC LAMINECTOMY FOR TUMOR;  Surgeon: Ashok Pall, MD;  Location: [place] [facility];  Service: Neurosurgery;  Laterality: N/A;   LAMINECTOMY N/A 09/03/2017   Procedure: POSTERIOR SPINAL TUMOR RESECTION THORACIC THREE;  Surgeon: Ashok Pall, MD;  Location: [place];  Service: Neurosurgery;  Laterality: N/A;  POSTERIOR SPINAL TUMOR RESECTION THORACIC THREE   POSTERIOR LUMBAR FUSION 4 LEVEL N/A 12/30/2013   Procedure: Thoracic one-Thoracic five posterior thoracic fusion with pedicle screws;  Surgeon: Ashok Pall, MD;  Location: [place] [facility];  Service: Neurosurgery;  Laterality: N/A;  Thoracic one-Thoracic five posterior thoracic fusion with pedicle screws   THYROIDECTOMY N/A 01/09/2014   Procedure: TOTAL THYROIDECTOMY;  Surgeon: Izora Gala, MD;  [facility];  Service: ENT;  Laterality: N/A;   TONSILLECTOMY          Family History  Problem Relation Age of Onset   Hypertension Mother    Diabetes Mother    Hypertension Father    Stroke Father    Diabetes Father    Diabetes Sister    Diabetes Sister    Social History:  reports that she has never smoked. She has never used smokeless tobacco. She reports that she does not drink alcohol or use drugs. Allergies:       Allergies  Allergen  Reactions   Bee Venom Anaphylaxis   Heparin Other (See Comments)    Weak positive platelets induced antibodies, SRA negative--2015   Keflex [Cephalexin] Nausea And Vomiting   Tramadol Nausea And Vomiting   Gabapentin Nausea And Vomiting   Hydrochlorothiazide Nausea And Vomiting   Hydrocodone Nausea And Vomiting   Oxycodone-Acetaminophen Nausea And Vomiting   Sorbitol Nausea Only    And abdominal distress   Tizanidine     Fatigue          Medications Prior to Admission  Medication Sig Dispense Refill   acetaminophen (TYLENOL) 325 MG tablet Take 2 tablets (650 mg total) by mouth every 4 (four) hours as needed for mild pain ((score 1 to 3) or temp > 100.5).     albuterol (PROVENTIL) (2.5 MG/3ML) 0.083% nebulizer solution Take 3 mLs (2.5 mg total) by nebulization every 6 (six) hours as needed for wheezing or shortness of breath. 75 mL 12   baclofen (LIORESAL) 20 MG tablet Take 1 tablet (20 mg total) by mouth 2 (two) times daily. 60 tablet 4   cetirizine (ZYRTEC) 10 MG tablet Take 1 tablet (10 mg total) by mouth daily. 30 tablet 11   fluticasone (FLONASE) 50 MCG/ACT nasal spray Place 2 sprays into both nostrils daily. [place]  g 6   HYDROcodone-acetaminophen (NORCO/VICODIN) 5-325 MG tablet Take 1 tablet by mouth every 8 (eight) hours as needed for up to 10 days for moderate pain. 30 tablet 0   ipratropium-albuterol (DUONEB) 0.5-2.5 (3) MG/3ML SOLN Take 3 mLs by nebulization 3 (three) times daily. 360 mL    levothyroxine (SYNTHROID) 150 MCG tablet Take 1 tablet (150 mcg total) by mouth daily before breakfast. 30 tablet 1   Multiple Vitamin (MULTIVITAMIN WITH MINERALS) TABS tablet Take 1 tablet by mouth daily.     nystatin (MYCOSTATIN/NYSTOP) powder Apply topically 4 (four) times daily. To groin. 60 g 3   ondansetron (ZOFRAN) 4 MG tablet Take 1 tablet by mouth every 6 (six) hours as needed for nausea or vomiting.      oxymetazoline (AFRIN NASAL SPRAY) 0.05 %  nasal spray Place 1 spray into both nostrils 2 (two) times daily. 30 mL 0   pantoprazole (PROTONIX) 40 MG tablet Take 1 tablet (40 mg total) by mouth daily. (Patient taking differently: Take 40 mg by mouth daily. ) 30 tablet 0   polyethylene glycol (MIRALAX / GLYCOLAX) packet Take 17 g by mouth daily. 14 each 0   rivaroxaban (XARELTO) 20 MG TABS tablet Take 1 tablet (20 mg total) by mouth daily with supper. 30 tablet 3   senna (SENOKOT) 8.6 MG TABS tablet Take 1 tablet (8.6 mg total) by mouth at bedtime as needed for mild constipation. 120 each 0   sertraline (ZOLOFT) 50 MG tablet Take 50 mg by mouth daily.      fluconazole (DIFLUCAN) 100 MG tablet Take 1 tablet (100 mg total) by mouth daily. 14 tablet 0    Home: Home Living Family/patient expects to be discharged to:: Private residence Living Arrangements: Other relatives Available Help at Discharge: Available PRN/intermittently Type of Home: House Home Access: Ramped entrance Home Layout: One level Bathroom Shower/Tub: Chiropodist: Handicapped height Bathroom Accessibility: No Home Equipment: Environmental consultant - 2 wheels, Bedside commode, Shower seat, Wheelchair - power, Wheelchair - manual, Hospital bed, Other (comment) Additional Comments: Bathroom is not accessible.  Pt sponge bathes and performs bowel program supine.  uses sliding board for transfers to power wheelchair  Functional History: Prior Function Level of Independence: Needs assistance Gait / Transfers Assistance Needed: nonambulatory, uses sliding board with 1 person assist for bed<>w/c transfers ADL's / Homemaking Assistance Needed: Requires "a lot" of help from family  Comments: Pt reports using RW for short distances with therapy only until May 2019 Functional Status:  Mobility: Bed Mobility Overal bed mobility: Needs Assistance Bed Mobility: Rolling, Sidelying to Sit, Sit to Sidelying Rolling: Mod assist, +2 for physical assistance Sidelying to  sit: Mod assist, +2 for physical assistance Sit to sidelying: Mod assist, +2 for physical assistance General bed mobility comments: Heavy mod assist and use of bed pad to cradle hips and stabilize LEs to roll and come to sit Transfers Overall transfer level: Needs assistance Transfers: Lateral/Scoot Transfers  Lateral/Scoot Transfers: +2 physical assistance, Mod assist General transfer comment: Simulated lateral scoot transfer, scooting to HOB, using bed pad to minimize shear  ADL: ADL Overall ADL's : Needs assistance/impaired Eating/Feeding: Independent Grooming: Wash/dry hands, Wash/dry face, Oral care, Brushing hair, Set up, Bed level Upper Body Bathing: Minimal assistance, Bed level Upper Body Bathing Details (indicate cue type and reason): assist due to pain  Lower Body Bathing: Maximal assistance, Bed level Upper Body Dressing : Maximal assistance, Bed level Lower Body Dressing: Total assistance, Bed level Toilet Transfer: Total assistance  Toilet Transfer Details (indicate cue type and reason): does not perform at home.  performs bowel program supine  Toileting- Clothing Manipulation and Hygiene: Total assistance, Bed level Functional mobility during ADLs: Maximal assistance, +2 for physical assistance General ADL Comments: Pt limited by Pain Lt breast area   Cognition: Cognition Overall Cognitive Status: Within Functional Limits for tasks assessed Cognition Arousal/Alertness: Awake/alert Behavior During Therapy: WFL for tasks assessed/performed Overall Cognitive Status: Within Functional Limits for tasks assessed  Blood pressure (!) 90/57, pulse 70, temperature 98.7 F (37.1 C), temperature source Oral, resp. rate 16, height 5\' 11"  (1.803 m), weight 101.6 kg, last menstrual period 10/22/2016, SpO2 97 %. Physical Exam  Neurological:  [name] alert complaints of muscle spasms.  Oriented to person place and time.  Follows commands.  Skin:  Wound was not examined as  patient was in bed and could not turn   General: No acute distress Mood and affect are appropriate Heart: Regular rate and rhythm no rubs murmurs or extra sounds Lungs: Clear to auscultation, breathing unlabored, no rales or wheezes Abdomen: Positive bowel sounds, soft nontender to palpation, nondistended Extremities: No clubbing, cyanosis, or edema Skin: No evidence of breakdown, no evidence of rash Neurologic: Cranial nerves II through XII intact, motor strength is 5/5 in bilateral deltoid, bicep, tricep, grip,0/5 Bilateral  hip flexor, knee extensors, ankle dorsiflexor and plantar flexor Sensory exam normal sensation to light touch and proprioception in bilateral upper and lower extremities Cerebellar exam normal finger to nose to finger as well as heel to shin in bilateral upper and lower extremities Musculoskeletal: Full range of motion in all 4 extremities. No joint swelling  LabResultsLast24Hours       Results for orders placed or performed during the hospital encounter of 08/09/18 (from the past 24 hour(s))  Hemoglobin and hematocrit, blood     Status: Abnormal   Collection Time: 08/10/18 10:15 AM  Result Value Ref Range   Hemoglobin 7.5 (L) 12.0 - 15.0 g/dL   HCT 23.6 (L) 36.0 - 38.1 %  Basic metabolic panel     Status: Abnormal   Collection Time: 08/11/18  3:17 AM  Result Value Ref Range   Sodium 137 135 - 145 mmol/L   Potassium 4.0 3.5 - 5.1 mmol/L   Chloride 104 98 - 111 mmol/L   CO2 26 22 - 32 mmol/L   Glucose, Bld 77 70 - 99 mg/dL   BUN 15 6 - 20 mg/dL   Creatinine, Ser 1.11 (H) 0.44 - 1.00 mg/dL   Calcium 7.5 (L) 8.9 - 10.3 mg/dL   GFR calc non Af Amer 57 (L) >60 mL/min   GFR calc Af Amer >60 >60 mL/min   Anion gap 7 5 - 15  CBC with Differential/Platelet     Status: Abnormal   Collection Time: 08/11/18  3:17 AM  Result Value Ref Range   WBC 8.1 4.0 - 10.5 K/uL   RBC 2.16 (L) 3.87 - 5.11 MIL/uL   Hemoglobin 6.3 (LL) 12.0 - 15.0 g/dL    HCT 20.1 (L) 36.0 - 46.0 %   MCV 93.1 80.0 - 100.0 fL   MCH 29.2 26.0 - 34.0 pg   MCHC 31.3 30.0 - 36.0 g/dL   RDW 13.6 11.5 - 15.5 %   Platelets 198 150 - 400 K/uL   nRBC 0.0 0.0 - 0.2 %   Neutrophils Relative % 85 %   Neutro Abs 6.9 1.7 - 7.7 K/uL   Lymphocytes Relative 8 %   Lymphs  Abs 0.6 (L) 0.7 - 4.0 K/uL   Monocytes Relative 4 %   Monocytes Absolute 0.4 0.1 - 1.0 K/uL   Eosinophils Relative 2 %   Eosinophils Absolute 0.2 0.0 - 0.5 K/uL   Basophils Relative 0 %   Basophils Absolute 0.0 0.0 - 0.1 K/uL   Immature Granulocytes 1 %   Abs Immature Granulocytes 0.05 0.00 - 0.07 K/uL   *Note: Due to a large number of results and/or encounters for the requested time period, some results have not been displayed. A complete set of results can be found in Results Review.      ImagingResults(Last48hours)  Dg Chest 2 View  Result Date: 08/09/2018 CLINICAL DATA:  53 year old female with suspected sepsis. Metastatic Hurthle cell thyroid carcinoma. EXAM: CHEST - 2 VIEW COMPARISON:  PET-CT 05/13/2018 and earlier. FINDINGS: AP and lateral views of the chest. Mildly lower lung volumes compared to 2018. Stable cardiac size and mediastinal contours. No pneumothorax, pulmonary edema, pleural effusion or confluent pulmonary opacity. Left greater than right pleural effusions have resolved since February. Visualized tracheal air column is within normal limits. Chronic cervicothoracic/upper thoracic spinal hardware. Stable visualized osseous structures. Negative visible bowel gas pattern. IMPRESSION: No acute cardiopulmonary abnormality. Electronically Signed   By: Genevie Ann M.D.   On: 08/09/2018 22:04   Ct Pelvis W Contrast  Result Date: 08/10/2018 CLINICAL DATA:  53 year old female with suspected sepsis. Metastatic Hurthle cell thyroid carcinoma. Decubitus ulcers, query osteomyelitis. EXAM: CT PELVIS WITH CONTRAST TECHNIQUE: Multidetector CT imaging of the pelvis was performed  using the standard protocol following the bolus administration of intravenous contrast. CONTRAST:  173mL OMNIPAQUE IOHEXOL 300 MG/ML  SOLN COMPARISON:  PET-CT 05/13/2018. CT abdomen, and Pelvis 03/03/2019. FINDINGS: Urinary Tract: Foley catheter in place. Urinary bladder decompressed. Visible ureters appear normal. Bowel: Retained stool in the distal large bowel including a 7 centimeter stool ball in the rectum. Mild presacral stranding. No pelvic free fluid. Visible large bowel loops are nondilated. Negative appendix. Vascular/Lymphatic: The visible Major arterial structures and central pelvic veins appear to be enhancing and patent. No lymphadenopathy. Reproductive:  Negative. Other:  No pelvic free fluid. Musculoskeletal: Right inferior pubic ramus region gas containing skin wound on series 3, image 53 is new since the February PET-CT. No organized or drainable fluid collection is associated. The wound extends to near the periosteum of the inferior pubic ramus but there are no CT changes of osteomyelitis identified. Stable visualized osseous structures. Previous left femur ORIF. There is scattered additional patchy subcutaneous stranding including overlying the lower lumbar spine on series 3, image 1. But no other confluent decubitus wound. IMPRESSION: 1. Right inferior pubic ramus region gas containing decubitus wound is new since the February PET-CT and is in proximity to the right inferior pubic ramus but there is no CT evidence of osteomyelitis. 2. Lesser areas of decubitus soft tissue inflammation elsewhere. 3. Stool ball in the rectum, consider fecal impaction. Electronically Signed   By: Genevie Ann M.D.   On: 08/10/2018 02:34      Assessment/Plan: Diagnosis: Debility after sacral decubitus debridement 1. Does the need for close, 24 hr/day medical supervision in concert with the patient's rehab needs make it unreasonable for this patient to be served in a less intensive setting?  Potentially 2. Co-Morbidities requiring supervision/potential complications: T3 complete paraplegia secondary to due to metastatic thyroid carcinoma with neurogenic bowel bladder and severe spasticity bladder management, bowel management, safety, skin/wound care, disease management, medication administration, pain management and patient education, does  the patient require 24 hr/day rehab nursing? Potentially 3. Does the patient require coordinated care of a physician, rehab nurse, PT (1-2 hrs/day, 5 days/week) and OT (1-2 hrs/day, 5 days/week) to address physical and functional deficits in the context of the above medical diagnosis(es)? Potentially Addressing deficits in the following areas: balance, endurance, locomotion, strength, transferring, bowel/bladder control, bathing, dressing, feeding, grooming, toileting and psychosocial support 4. Can the patient actively participate in an intensive therapy program of at least 3 hrs of therapy per day at least 5 days per week? Potentially 5. The potential for patient to make measurable gains while on inpatient rehab is Patient appears to be at baseline therefore any rehab progress would likely be minimal 6. Anticipated functional outcomes upon discharge from inpatient rehab are n/a  with PT, n/a with OT, n/a with SLP. 7. Estimated rehab length of stay to reach the above functional goals is: NA 8. Anticipated D/C setting: Home 9. Anticipated post D/C treatments: HH therapy 10. Overall Rehab/Functional Prognosis: fair  RECOMMENDATIONS: This patient's condition is appropriate for continued rehabilitative care in the following setting: Patient appears to be at functional baseline therefore home with home health is appropriate once medically stable Patient has agreed to participate in recommended program. Potentially Note that insurance prior authorization may be required for reimbursement for recommended care.  Comment: If patient experiences any further  decline of functional status may benefit from short inpatient rehab stay  Would recommend increase of baclofen to 20 mg 3 times daily and follow-up with Dr. Naaman Plummer in physical medicine rehab outpatient clinic "I have personally performed a face to face diagnostic evaluation of this patient.  Additionally, I have reviewed and concur with the physician assistant's documentation above." [name] M.D. [facility] FAAPM&R (Sports Med, Neuromuscular Med) Diplomate Am Board of Electrodiagnostic Med  Elizabeth Sauer 08/11/2018    Revision History                             Routing History

## 2018-08-17 NOTE — Progress Notes (Signed)
Patient ID: Sarah [name], female   DOB: Jan 08, 1966, 54 y.o.   MRN: [id number]       Subjective: No new complaints.  Objective: Vital signs in last 24 hours: Temp:  [97.6 F (36.4 C)-98.2 F (36.8 C)] 98.2 F (36.8 C) (05/26 0856) Pulse Rate:  [60-66] 60 (05/26 0856) Resp:  [18-20] 18 (05/26 0856) BP: (86-128)/(56-81) 86/56 (05/26 0856) SpO2:  [97 %-100 %] 99 % (05/26 0856) Weight:  [101.3 kg] 101.3 kg (05/25 1948) Last BM Date: 08/16/18  Intake/Output from previous day: 05/25 0701 - 05/26 0700 In: 480 [P.O.:480] Out: 1653 [Urine:1652; Stool:1] Intake/Output this shift: Total I/O In: 300 [P.O.:300] Out: 300 [Urine:300]  PE: Skin: Wounds are cleaning up nicely.  Still with some necrotic tissue at base, but much improved.  No purulent drainage     Lab Results:  Recent Labs    08/16/18 0438 08/17/18 0403  WBC 4.3 4.5  HGB 8.5* 8.9*  HCT 27.1* 28.0*  PLT 226 217   BMET Recent Labs    08/15/18 0811 08/17/18 0403  NA 140 137  K 3.8 3.7  CL 106 103  CO2 20* 24  GLUCOSE 71 70  BUN 13 9  CREATININE 1.12* 1.11*  CALCIUM 7.8* 8.1*   PT/INR No results for input(s): LABPROT, INR in the last 72 hours. CMP     Component Value Date/Time   NA 137 08/17/2018 0403   NA 139 04/08/2018 1503   NA 143 01/02/2017 1002   K 3.7 08/17/2018 0403   K 4.0 01/02/2017 1002   CL 103 08/17/2018 0403   CO2 24 08/17/2018 0403   CO2 25 01/02/2017 1002   GLUCOSE 70 08/17/2018 0403   GLUCOSE 87 01/02/2017 1002   BUN 9 08/17/2018 0403   BUN 13 04/08/2018 1503   BUN 11.9 01/02/2017 1002   CREATININE 1.11 (H) 08/17/2018 0403   CREATININE 1.09 (H) 07/21/2018 1004   CREATININE 0.9 01/02/2017 1002   CALCIUM 8.1 (L) 08/17/2018 0403   CALCIUM 7.2 (L) 09/15/2017 0635   CALCIUM 8.8 01/02/2017 1002   PROT 6.8 08/09/2018 2129   PROT 5.8 (L) 04/08/2018 1503   PROT 6.3 (L) 01/02/2017 1002   ALBUMIN 2.6 (L) 08/09/2018 2129   ALBUMIN 2.7 (L) 04/08/2018 1503   ALBUMIN 3.2 (L)  01/02/2017 1002   AST 29 08/09/2018 2129   AST 9 (L) 07/21/2018 1004   AST 24 01/02/2017 1002   ALT 24 08/09/2018 2129   ALT <6 07/21/2018 1004   ALT 12 01/02/2017 1002   ALKPHOS 387 (H) 08/09/2018 2129   ALKPHOS 126 01/02/2017 1002   BILITOT 1.0 08/09/2018 2129   BILITOT 0.5 07/21/2018 1004   BILITOT 1.06 01/02/2017 1002   GFRNONAA 57 (L) 08/17/2018 0403   GFRNONAA 58 (L) 07/21/2018 1004   GFRNONAA 71 06/07/2015 1523   GFRAA >60 08/17/2018 0403   GFRAA >60 07/21/2018 1004   GFRAA 82 06/07/2015 1523   Lipase     Component Value Date/Time   LIPASE 20 03/02/2018 1448       Studies/Results: No results found.  Anti-infectives: Anti-infectives (From admission, onward)   Start     Dose/Rate Route Frequency Ordered Stop   08/15/18 1400  cefTRIAXone (ROCEPHIN) 1 g in sodium chloride 0.9 % 100 mL IVPB     1 g 200 mL/hr over 30 Minutes Intravenous Every 24 hours 08/15/18 0843     08/15/18 0845  clindamycin (CLEOCIN) capsule 600 mg     600 mg Oral  Every 8 hours 08/15/18 0843     08/14/18 0600  metroNIDAZOLE (FLAGYL) IVPB 500 mg  Status:  Discontinued     500 mg 100 mL/hr over 60 Minutes Intravenous Every 8 hours 08/14/18 0519 08/15/18 0843   08/12/18 1400  ceFEPIme (MAXIPIME) 2 g in sodium chloride 0.9 % 100 mL IVPB  Status:  Discontinued     2 g 200 mL/hr over 30 Minutes Intravenous Every 8 hours 08/12/18 1141 08/15/18 0843   08/12/18 1145  metroNIDAZOLE (FLAGYL) IVPB 500 mg  Status:  Discontinued     500 mg 100 mL/hr over 60 Minutes Intravenous Every 8 hours 08/12/18 1141 08/14/18 0519   08/11/18 0200  vancomycin (VANCOCIN) 1,250 mg in sodium chloride 0.9 % 250 mL IVPB  Status:  Discontinued     1,250 mg 166.7 mL/hr over 90 Minutes Intravenous Every 24 hours 08/10/18 0148 08/15/18 0843   08/10/18 0800  piperacillin-tazobactam (ZOSYN) IVPB 3.375 g  Status:  Discontinued     3.375 g 12.5 mL/hr over 240 Minutes Intravenous Every 8 hours 08/10/18 0315 08/12/18 1141   08/10/18  0200  vancomycin (VANCOCIN) 2,000 mg in sodium chloride 0.9 % 500 mL IVPB     2,000 mg 250 mL/hr over 120 Minutes Intravenous  Once 08/10/18 0147 08/10/18 0523   08/10/18 0030  vancomycin (VANCOCIN) IVPB 1000 mg/200 mL premix  Status:  Discontinued     1,000 mg 200 mL/hr over 60 Minutes Intravenous  Once 08/10/18 0018 08/10/18 0147   08/10/18 0030  piperacillin-tazobactam (ZOSYN) IVPB 3.375 g     3.375 g 100 mL/hr over 30 Minutes Intravenous  Once 08/10/18 0018 08/10/18 0149       Assessment/Plan  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 -will check again in a couple of days.  FEN -regular VTE -xarelto  ID -Rocephin, no need for further abx therapy in regards to her wounds.  These are clean and not infected.  WBC normal   LOS: 7 days    Henreitta Cea , [facility] 08/17/2018, 10:46 AM Pager: [phone]

## 2018-08-17 NOTE — Progress Notes (Addendum)
Michel Santee, PT  Rehab Admission Coordinator  Physical Medicine and Rehabilitation  PMR Pre-admission  Signed  Date of Service:  08/16/2018 2:30 PM       Related encounter: ED to Hosp-Admission (Discharged) from 08/09/2018 in [facility]      Signed         Show:Clear all [x]Manual[x]Template[x]Copied  Added by: [x]Warren, Earnest Conroy, PT  []Hover for details PMR Admission Coordinator Pre-Admission Assessment  Patient: Sarah [name] is an 53 y.o., female MRN: [id number] DOB: 16-Feb-1966 Height: 5' 11" (180.3 cm) Weight: 101.4 kg                                                                                                                                                  Insurance Information HMO: yes    PPO:      PCP:      IPA:      80/20:      OTHER:  PRIMARY: UHC Medicare      Policy#: [id number]      Subscriber: patient CM Name: Ge[place]ne      Phone#: [phone]     Fax#: [phone] Pre-Cert#: [id number] authorization provided by [name] with updates due to Francetta Found at [phone] (fax) on 5/28   Employer:  Benefits:  Phone #: [phone]     Name:  Eff. Date: 07/23/2018     Deduct: $0      Out of Pocket Max: $4500 (met $8.37)      Life Max: n/a CIR: $325/day for days 1-5      SNF: 20 full days Outpatient: $35/visit     Co-Pay:  Home Health: 100%      Co-Pay:  DME: 80%     Co-Pay: 20% Providers: in network  SECONDARY:       Policy#:       Subscriber:  CM Name:       Phone#:      Fax#:  Pre-Cert#:       Employer:  Benefits:  Phone #:      Name:  Eff. Date:      Deduct:       Out of Pocket Max:       Life Max:  CIR:       SNF:  Outpatient:      Co-Pay:  Home Health:       Co-Pay:  DME:      Co-Pay:   Medicaid Application Date:       Case Manager:  Disability Application Date:       Case Worker:   The Data Collection Information Summary for patients in Inpatient Rehabilitation Facilities with attached Privacy Act  [place] Records was provided and verbally reviewed with: Patient  Emergency Contact Information         Contact Information  Name Relation Home Work [place] Mother [phone]  [phone]   Ezzard Flax [phone]       Current Medical History  Patient Admitting Diagnosis: sepsis 2/2 sacral decubitus ulcers, T3 SCI  History of Present Illness: Sarah [name] a 53 y.o.right-handed femalewith history of hypertension, 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 KMelchor Amourreceivedinpatient rehab services 09/21/2017 until 10/30/2017 [facility]. Patient hassince returned home living with her sister and brother-in-law nonambulatory using sliding board with 1 person assist for bed to wheelchair transfers. Sister and brother-in-law all assist in all aspects of bathing dressing toileting and functional mobility 1 level home with ramped entrance. Presented 08/10/2018 with severe sacral decubitus ulcers that began in late February and have gradually worsened since that time as well as fever 102.5, WBC 16,800. Placed on broad-spectrum antibiotics. CT of the pelvis showed right inferior pubic ramus region gas containing decubitus wound new since February PET/CT but no CT evidence of osteomyelitis. Underwent bedside debridement per general surgery with hydrotherapy initiated. Hospital course pain management. Therapy evaluations completed with recommendations of physical medicine rehab consult.  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   Chronic back pain    Complication of anesthesia     Nausea and vomiting   DDD (degenerative disc disease), cervical    Depression    DJD (degenerative joint disease)    Family history of adverse reaction to anesthesia    MOTHER HAS NAUSEA    GERD (gastroesophageal reflux disease)    History of blood transfusion    08/2017   History of rectal fissure    HIT (heparin-induced thrombocytopenia) (HCC)    Hypertension    05/27/2018- not current- runs low   Hypothyroidism    Obesity    Paraplegia at T4 level ([facility]    Pneumonia 03/2018   Pulmonary embolus, right ([facility] 2015   Rotator cuff tendonitis right    Family History  family history includes Diabetes in her father, mother, sister, and sister; Hypertension in her father and mother; Stroke in her father.  Prior Rehab/Hospitalizations:  Has the patient had prior rehab or hospitalizations prior to admission? Yes  Has the patient had major surgery during 100 days prior to admission? No  Current Medications   Current Facility-Administered Medications:    0.9 %  sodium chloride infusion, 250 mL, Intravenous, PRN, OBenay Pike MD, Stopped at 08/10/18 1116   acetaminophen (TYLENOL) tablet 650 mg, 650 mg, Oral, Q6H PRN **OR** acetaminophen (TYLENOL) suppository 650 mg, 650 mg, Rectal, Q6H PRN, OBenay Pike MD   baclofen (LIORESAL) tablet 20 mg, 20 mg, Oral, TID, Kirsteins, ALuanna Salk MD, 20 mg at 08/16/18 1148   cefTRIAXone (ROCEPHIN) 1 g in sodium chloride 0.9 % 100 mL IVPB, 1 g, Intravenous, Q24H, Diallo, Abdoulaye, MD, Last Rate: 200 mL/hr at 08/15/18 1551, 1 g at 08/15/18 1551   clindamycin (CLEOCIN) capsule 600 mg, 600 mg, Oral, Q8H, Diallo, Abdoulaye, MD, 600 mg at 08/16/18 1148   collagenase (SANTYL) ointment, , Topical, Daily, OSaverio Danker PA-C   feeding supplement (ENSURE ENLIVE) (ENSURE ENLIVE) liquid 237 mL, 237 mL, Oral, BID BM, Hensel, WJamal Collin MD, [phone]  mL at 08/15/18 1102   ferrous sulfate tablet 325 mg, 325 mg, Oral, Q48H,  Guadalupe [name], MD, 325 mg at 08/15/18 1555   fluticasone (FLONASE) 50 MCG/ACT nasal spray 2 spray, 2 spray, Each Nare, Daily, [name], MD, 2 spray at 08/16/18 1155   Glycerin (Adult) 2.1 g suppository 1 suppository, 1 suppository, Rectal, QHS, Meccariello, Bernita Raisin, DO, 1 suppository at 08/15/18 2142   levothyroxine (SYNTHROID) tablet 150 mcg, 150 mcg, Oral, Q0600, [name], MD, 150 mcg at 08/16/18 9030   loratadine (CLARITIN) tablet 10 mg, 10 mg, Oral, Daily, [name], MD, 10 mg at 08/16/18 1149   multivitamin with minerals tablet 1 tablet, 1 tablet, Oral, Daily, [name], MD, 1 tablet at 08/16/18 1149   nutrition supplement (JUVEN) (JUVEN) powder packet 1 packet, 1 packet, Oral, BID BM, Hensel, Jamal Collin, MD, 1 packet at 08/16/18 1150   ondansetron (ZOFRAN-ODT) disintegrating tablet 4 mg, 4 mg, Oral, Q8H PRN, Martyn Malay, MD, 4 mg at 08/15/18 0923   oxyCODONE (Oxy IR/ROXICODONE) immediate release tablet 5 mg, 5 mg, Oral, Q6H PRN, [name], MD, 5 mg at 08/16/18 1008   pantoprazole (PROTONIX) EC tablet 40 mg, 40 mg, Oral, Daily, [name], MD, 40 mg at 08/16/18 1149   polyethylene glycol (MIRALAX / GLYCOLAX) packet 17 g, 17 g, Oral, Daily, Meccariello, Bernita Raisin, DO, 17 g at 08/16/18 1149   promethazine (PHENERGAN) tablet 12.5 mg, 12.5 mg, Oral, Q6H PRN, [name], MD, 12.5 mg at 08/16/18 3007   rivaroxaban (XARELTO) tablet 20 mg, 20 mg, Oral, Daily, Diallo, Abdoulaye, MD, 20 mg at 08/16/18 1149   senna (SENOKOT) tablet 8.6 mg, 1 tablet, Oral, QHS PRN, Meccariello, Bernita Raisin, DO   sertraline (ZOLOFT) tablet 50 mg, 50 mg, Oral, Daily, [name], MD, 50 mg at 08/16/18 1148   silver nitrate applicators applicator 1 application, 1 application, Topical, Once, Rayburn, Kelly A, PA-C   sodium chloride flush (NS) 0.9 % injection 3 mL, 3 mL, Intravenous, Q12H, [name], MD, 3 mL at 08/16/18 1156   sodium chloride flush (NS) 0.9  % injection 3 mL, 3 mL, Intravenous, PRN, [name], MD  Patients Current Diet:     Diet Order                  Diet regular Room service appropriate? Yes; Fluid consistency: Thin  Diet effective now               Precautions / Restrictions Precautions Precautions: Fall Precaution Comments: Watch for shear with transfers Restrictions Weight Bearing Restrictions: No   Has the patient had 2 or more falls or a fall with injury in the past year?No  Prior Activity Level Limited Community (1-2x/wk): went out for appointments, did not drive, recently purchased a w/c accessible Lucianne Lei because car transfers were too challenging  Prior Functional Level Prior Function Level of Independence: Needs assistance Gait / Transfers Assistance Needed: nonambulatory, uses sliding board with 1 person assist for bed<>w/c transfers ADL's / Homemaking Assistance Needed: Requires "a lot" of help from family  Comments: pt has been non-ambulatory about 1 year, was on CIR in July 2019  Self Care: Did the patient need help bathing, dressing, using the toilet or eating?  Needed some help  Indoor Mobility: Did the patient need assistance with walking from room to room (with or without device)? Dependent/non-ambulatory  Stairs: Did the patient need assistance with internal or  external stairs (with or without device)? Dependent  Functional Cognition: Did the patient need help planning regular tasks such as shopping or remembering to take medications? Independent  Development worker, international aid / Equipment Home Assistive Devices/Equipment: Transport planner, Wheelchair Home Equipment: Environmental consultant - 2 wheels, Bedside commode, Shower seat, Wheelchair - power, Wheelchair - manual, Hospital bed, Other (comment)  Prior Device Use: Indicate devices/aids used by the patient prior to current illness, exacerbation or injury? Manual wheelchair and Motorized wheelchair or scooter  Current Functional  Level Cognition  Overall Cognitive Status: Within Functional Limits for tasks assessed    Extremity Assessment (includes Sensation/Coordination)  Upper Extremity Assessment: Generalized weakness LUE Deficits / Details: Pt moves cautiously with Lt UE due to pain   Lower Extremity Assessment: Defer to PT evaluation    ADLs  Overall ADL's : Needs assistance/impaired Eating/Feeding: Independent Grooming: Wash/dry hands, Wash/dry face, Oral care, Brushing hair, Set up, Bed level Upper Body Bathing: Minimal assistance, Bed level Upper Body Bathing Details (indicate cue type and reason): assist due to pain  Lower Body Bathing: Maximal assistance, Bed level Upper Body Dressing : Maximal assistance, Bed level Lower Body Dressing: Total assistance, Bed level Toilet Transfer: Total assistance Toilet Transfer Details (indicate cue type and reason): does not perform at home.  performs bowel program supine  Toileting- Clothing Manipulation and Hygiene: Total assistance, Bed level Functional mobility during ADLs: Maximal assistance, +2 for physical assistance General ADL Comments: able to provide instruction and guidance for how to assist her with transfers    Mobility  Overal bed mobility: Needs Assistance Bed Mobility: Rolling, Sit to Sidelying Rolling: Min assist Sidelying to sit: Mod assist, +2 for physical assistance Sit to sidelying: Mod assist, +2 for physical assistance General bed mobility comments: Pt required assistance to lower trunk and lift B LEs back into bed against gravity.      Transfers  Overall transfer level: Needs assistance Equipment used: Sliding board Transfers: Lateral/Scoot Transfers  Lateral/Scoot Transfers: Mod assist General transfer comment: Pt able to scoot back to bed with mod +1 and use of bed pad.  PTA continued to give total assistance for set up and placement of board.      Ambulation / Gait / Stairs / Office manager /  Balance Dynamic Sitting Balance Sitting balance - Comments: decreased core strength and stability to sit independently at EOB.  Balance Overall balance assessment: Needs assistance Sitting balance-Leahy Scale: Poor Sitting balance - Comments: decreased core strength and stability to sit independently at EOB.     Special needs/care consideration BiPAP/CPAP no CPM no Continuous Drip IV no Dialysis no        Days n/a Life Vest no Oxygen no  Special Bed air mattress Trach Size Wound Vac (area) no      Location n/a Skin large decubitus ulcers on bilateral ischial tuberosities                          Bowel mgmt: bowel program in PM  Bladder mgmt: I/O caths at baseline Diabetic mgmt no Behavioral consideration no Chemo/radiation no     Previous Home Environment (from acute therapy documentation) Living Arrangements: Other relatives Available Help at Discharge: Available PRN/intermittently Type of Home: House Home Layout: One level Home Access: Ramped entrance Bathroom Shower/Tub: Chiropodist: Handicapped height Bathroom Accessibility: No Home Care Services: No Additional Comments: Bathroom is not accessible.  Pt sponge bathes and performs  bowel program supine.  uses sliding board for transfers to power wheelchair  Discharge Living Setting Plans for Discharge Living Setting: Patient's home, House Type of Home at Discharge: House Discharge Home Layout: One level Discharge Home Access: [place] entrance Discharge Bathroom Shower/Tub: Lowgap unit Discharge Bathroom Toilet: Handicapped height Discharge Bathroom Accessibility: No Does the patient have any problems obtaining your medications?: No  Social/Family/Support Systems Anticipated Caregiver: pt's sister/brother in law Anticipated Caregiver's Contact Information: [phone] Ability/Limitations of Caregiver: has been providing assist for slide board transfers since August of 2019 Caregiver  Availability: Intermittent Discharge Plan Discussed with Primary Caregiver: Rahmah has been in contact with her sister Is Caregiver In Agreement with Plan?: Yes Does Caregiver/Family have Issues with Lodging/Transportation while Pt is in Rehab?: No   Goals/Additional Needs Patient/Family Goal for Rehab: PT/OT min to mod assist Expected length of stay: 17-20 days Dietary Needs: regular, thin Equipment Needs: tbd Special Service Needs: hydrotherapy 6x/week Pt/Family Agrees to Admission and willing to participate: Yes Program Orientation Provided & Reviewed with Pt/Caregiver Including Roles  & Responsibilities: Yes    Possible need for SNF placement upon discharge: possibly   Patient Condition: This patient's medical and functional status has changed since the consult dated: 5/20 in which the Rehabilitation Physician determined and documented that the patient's condition is appropriate for intensive rehabilitative care in an inpatient rehabilitation facility. See "History of Present Illness" (above) for medical update. Functional changes are: pt currently mod/max for slide board transfers. Patient's medical and functional status update has been discussed with the Rehabilitation physician and patient remains appropriate for inpatient rehabilitation. Will admit to inpatient rehab today.  Preadmission Screen Completed By:  Michel Santee, PT, DPT 08/16/2018 2:30 PM ______________________________________________________________________   Discussed status with Dr. Naaman Plummer on 08/16/18 at 2:39 PM  and received approval for admission today.  Admission Coordinator:  Michel Santee, PT, DPT time 2:39 PM Sudie Grumbling 08/16/18           Cosigned by: [name], MD at 08/17/2018 11:27 AM  Revision History

## 2018-08-17 NOTE — Progress Notes (Signed)
Inpatient Rehabilitation  Patient information reviewed and entered into eRehab system by Zilla Shartzer M. Arma Reining, M.A., CCC/SLP, PPS Coordinator.  Information including medical coding, functional ability and quality indicators will be reviewed and updated through discharge.    

## 2018-08-18 ENCOUNTER — Inpatient Hospital Stay (HOSPITAL_COMMUNITY): Payer: Medicare Other | Admitting: Physical Therapy

## 2018-08-18 ENCOUNTER — Inpatient Hospital Stay (HOSPITAL_COMMUNITY): Payer: Medicare Other | Admitting: Occupational Therapy

## 2018-08-18 DIAGNOSIS — N390 Urinary tract infection, site not specified: Secondary | ICD-10-CM

## 2018-08-18 DIAGNOSIS — K592 Neurogenic bowel, not elsewhere classified: Secondary | ICD-10-CM

## 2018-08-18 DIAGNOSIS — L89303 Pressure ulcer of unspecified buttock, stage 3: Secondary | ICD-10-CM

## 2018-08-18 DIAGNOSIS — A499 Bacterial infection, unspecified: Secondary | ICD-10-CM

## 2018-08-18 LAB — COMPREHENSIVE METABOLIC PANEL
ALT: 10 U/L (ref 0–44)
AST: 15 U/L (ref 15–41)
Albumin: 2.2 g/dL — ABNORMAL LOW (ref 3.5–5.0)
Alkaline Phosphatase: 373 U/L — ABNORMAL HIGH (ref 38–126)
Anion gap: 16 — ABNORMAL HIGH (ref 5–15)
BUN: 9 mg/dL (ref 6–20)
CO2: 22 mmol/L (ref 22–32)
Calcium: 8.1 mg/dL — ABNORMAL LOW (ref 8.9–10.3)
Chloride: 100 mmol/L (ref 98–111)
Creatinine, Ser: 1.21 mg/dL — ABNORMAL HIGH (ref 0.44–1.00)
GFR calc Af Amer: 60 mL/min — ABNORMAL LOW (ref >60)
GFR calc non Af Amer: 51 mL/min — ABNORMAL LOW (ref >60)
Glucose, Bld: 61 mg/dL — ABNORMAL LOW (ref 70–99)
Potassium: 3.9 mmol/L (ref 3.5–5.1)
Sodium: 138 mmol/L (ref 135–145)
Total Bilirubin: 1.1 mg/dL (ref 0.3–1.2)
Total Protein: 5.6 g/dL — ABNORMAL LOW (ref 6.5–8.1)

## 2018-08-18 LAB — CBC WITH DIFFERENTIAL/PLATELET
Abs Immature Granulocytes: 0.24 10*3/uL — ABNORMAL HIGH (ref 0.00–0.07)
Basophils Absolute: 0 10*3/uL (ref 0.0–0.1)
Basophils Relative: 1 %
Eosinophils Absolute: 0.2 10*3/uL (ref 0.0–0.5)
Eosinophils Relative: 4 %
HCT: 28.7 % — ABNORMAL LOW (ref 36.0–46.0)
Hemoglobin: 9.3 g/dL — ABNORMAL LOW (ref 12.0–15.0)
Immature Granulocytes: 6 %
Lymphocytes Relative: 32 %
Lymphs Abs: 1.2 10*3/uL (ref 0.7–4.0)
MCH: 29 pg (ref 26.0–34.0)
MCHC: 32.4 g/dL (ref 30.0–36.0)
MCV: 89.4 fL (ref 80.0–100.0)
Monocytes Absolute: 0.3 10*3/uL (ref 0.1–1.0)
Monocytes Relative: 8 %
Neutro Abs: 1.9 10*3/uL (ref 1.7–7.7)
Neutrophils Relative %: 49 %
Platelets: 220 10*3/uL (ref 150–400)
RBC: 3.21 MIL/uL — ABNORMAL LOW (ref 3.87–5.11)
RDW: 14.6 % (ref 11.5–15.5)
WBC: 3.9 10*3/uL — ABNORMAL LOW (ref 4.0–10.5)
nRBC: 0 % (ref 0.0–0.2)

## 2018-08-18 MED ORDER — BISACODYL 10 MG RE SUPP
10.0000 mg | Freq: Every day | RECTAL | Status: DC
  Administered 2018-08-19 – 2018-09-02 (×15): 10 mg via RECTAL
  Filled 2018-08-18 (×16): qty 1

## 2018-08-18 MED ORDER — PREGABALIN 50 MG PO CAPS
50.0000 mg | ORAL_CAPSULE | Freq: Two times a day (BID) | ORAL | Status: DC
  Administered 2018-08-18 – 2018-08-20 (×4): 50 mg via ORAL
  Filled 2018-08-18 (×4): qty 1

## 2018-08-18 MED ORDER — SENNOSIDES-DOCUSATE SODIUM 8.6-50 MG PO TABS
2.0000 | ORAL_TABLET | Freq: Every day | ORAL | Status: DC
  Administered 2018-08-19 – 2018-08-25 (×4): 2 via ORAL
  Filled 2018-08-18 (×10): qty 2

## 2018-08-18 NOTE — Evaluation (Signed)
Physical Therapy Assessment and Plan  Patient Details  Name: Sarah [name] MRN: [id number] Date of Birth: [birth year]  PT Diagnosis: Impaired sensation, Muscle weakness and Paraplegia Rehab Potential: Good ELOS: 12-14 days   Today's Date: 08/18/2018 PT Individual Time: 0900-1000 PT Individual Time Calculation (min): 60 min    Problem List:  Patient Active Problem List   Diagnosis Date Noted  . Febrile illness   . Bacterial skin infection 08/10/2018  . Eustachian tube disorder, left 06/30/2018  . Urinary tract infection 06/30/2018  . Malignant neoplasm ([place]) 06/01/2018  . Metastasis to brain ([place]) 06/01/2018  . Transaminitis 04/09/2018  . Hypoalbuminemia 04/09/2018  . Dyspnea   . Multifocal pneumonia   . Palliative care by specialist   . Goals of care, counseling/discussion   . DNR (do not resuscitate) discussion   . Fecal impaction ([place]) 02/06/2018  . HCAP (healthcare-associated pneumonia) 02/06/2018  . Anemia   . Chronic anticoagulation   . Sacral ulcer ([place]) 02/04/2018  . Fracture   . Acute lower UTI   . Myelopathy ([place]) 09/21/2017  . Muscle spasms of both lower extremities   . Closed fracture of shaft of left femur ([place])   . Acute blood loss anemia   . Hypothyroidism   . Nausea & vomiting 09/15/2017  . Closed fracture of left distal femur ([place]) 09/11/2017  . Paraplegia ([place]) 09/08/2017  . Obesity (BMI 30-39.9) 09/03/2017  . Status post surgery 09/03/2017  . Calculus of gallbladder without cholecystitis without obstruction   . Biliary dyskinesia   . Intractable nausea and vomiting 08/09/2017  . Thrombocytopenia ([place]) 08/09/2017  . Elevated bilirubin 08/09/2017  . AKI (acute kidney injury) ([place]) 08/09/2017  . Chest pain 01/26/2017  . Upper respiratory infection, viral 01/26/2017  . Counseling regarding advanced care planning and goals of care 01/02/2017  . Bilateral rotator cuff syndrome 02/12/2016  . E. coli UTI 08/01/2015  . Constipation due to neurogenic  bowel 08/01/2015  . 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 Spastic paraparesis   . Neurogenic bladder   . History of pulmonary embolism   . Post-operative pain   . Metastatic cancer ([place])   . Steroid-induced hyperglycemia   . Depression   . Obstipation   . Thoracic spine tumor 07/06/2015  . 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 ([place]) 07/06/2015  . Spinal cord compression due to malignant neoplasm metastatic to spine ([place])   . Postoperative hypothyroidism   . 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 Spasticity 09/19/2014  . Hypertension 05/01/2014  . Ingrown right big toenail 05/01/2014  . GERD (gastroesophageal reflux disease) 02/14/2014  . Dysphagia, pharyngoesophageal phase 02/06/2014  . Type 2 diabetes mellitus without complication ([place]) 40/98/1191  . Constipation   . Neurogenic bowel   . 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 Rotator cuff tendonitis   . 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 Leg weakness, bilateral 12/13/2013    Past Medical History:  Past Medical History:  Diagnosis Date  . Anxiety   . Cancer [facility])    FNA of thyroid positive for onconytic/hurthle cell carcinoma  . Chronic back pain   . Complication of anesthesia    Nausea and vomiting  . DDD (degenerative disc disease), cervical   . Depression   . DJD (degenerative joint disease)   . Family history of adverse reaction to anesthesia    MOTHER HAS NAUSEA   .  GERD (gastroesophageal reflux disease)   . History of blood transfusion    08/2017  . History of rectal fissure   . HIT (heparin-induced thrombocytopenia) ([place])   . Hypertension    05/27/2018- not current- runs low  . Hypothyroidism   . Obesity   . Paraplegia at T4 level ([place])   . Pneumonia 03/2018  . Pulmonary embolus, right ([place]) 2015  . Rotator cuff tendonitis right   Past Surgical History:   Past Surgical History:  Procedure Laterality Date  . APPLICATION OF CRANIAL NAVIGATION N/A 06/01/2018   Procedure: APPLICATION OF CRANIAL NAVIGATION;  Surgeon: Erline Levine, MD;  Location: [place];  Service: Neurosurgery;  Laterality: N/A;  . CHOLECYSTECTOMY N/A 01/25/2018   Procedure: LAPAROSCOPIC CHOLECYSTECTOMY;  Surgeon: Virl Cagey, MD;  Location: [facility];  Service: General;  Laterality: N/A;  . CRANIOTOMY N/A 06/01/2018   Procedure: Craniectomy for skull metastasis with Brainlab;  Surgeon: Erline Levine, MD;  Location: [place];  Service: Neurosurgery;  Laterality: N/A;  . FEMUR IM NAIL Left 09/11/2017   Procedure: INTRAMEDULLARY (IM) RETROGRADE FEMORAL NAILING;  Surgeon: Altamese [place], MD;  Location: [place];  Service: Orthopedics;  Laterality: Left;  . LAMINECTOMY N/A 12/14/2013   Procedure: THORACIC LAMINECTOMY FOR TUMOR THORACIC THREE;  Surgeon: Ashok Pall, MD;  Location: [place] [facility];  Service: Neurosurgery;  Laterality: N/A;  THORACIC LAMINECTOMY FOR TUMOR THORACIC THREE  . LAMINECTOMY N/A 07/05/2015   Procedure: THORACIC LAMINECTOMY FOR TUMOR;  Surgeon: Ashok Pall, MD;  Location: [place] [facility];  Service: Neurosurgery;  Laterality: N/A;  . LAMINECTOMY N/A 09/03/2017   Procedure: POSTERIOR SPINAL TUMOR RESECTION THORACIC THREE;  Surgeon: Ashok Pall, MD;  Location: [place];  Service: Neurosurgery;  Laterality: N/A;  POSTERIOR SPINAL TUMOR RESECTION THORACIC THREE  . POSTERIOR LUMBAR FUSION 4 LEVEL N/A 12/30/2013   Procedure: Thoracic one-Thoracic five posterior thoracic fusion with pedicle screws;  Surgeon: Ashok Pall, MD;  Location: [place] [facility];  Service: Neurosurgery;  Laterality: N/A;  Thoracic one-Thoracic five posterior thoracic fusion with pedicle screws  . THYROIDECTOMY N/A 01/09/2014   Procedure: TOTAL THYROIDECTOMY;  Surgeon: Izora Gala, MD;  Location: [place];  Service: ENT;  Laterality: N/A;  . TONSILLECTOMY      Assessment & Plan Clinical Impression:  Sarah [name]. Sarah [name]  is a 53 year old right-handed female history of hypertension, CKD stage III with creatinine 1.11 pulmonary emboli maintained on Xarelto, myelopathy and paraplegia secondary to metastaticHurthlecancer to the thoracic spine status post recurrence of T3 spinal tumor with resection in June 2019 followed by Dr. Melchor Amour has received inpatient rehab services in the past last being 09/21/2017 until 10/30/2017 was discharged to [facility] skilled nursing facility. Patient has since returned home with her sister and brother-in-law nonambulatory using a sliding board with 1 person assist for bed to wheelchair transfers. Sister and brother-in-law all assist in all aspects of bathing dressing toileting and functional mobility. They live in a 1 level home with ramped entrance. Presented 08/10/2018 with severe sacral decubitus ulcer that began in late February gradually worsened since that time as well as fever of 102.5, WBC 16,800, hemoglobin 6.1. Initial blood culture from 518 with growth in 1 bottle GPC likely contaminant. Follow-up blood cultures show no growth to date. Placed on intravenous broad-spectrum antibiotics and transitionedto p.oclindamycin completed 08/17/2018. CT the pelvis showed right inferior pubic ramus region gas containing decubitus wound new since February PET/CTbut no CT evidence of osteomyelitis. Underwent bedside debridement per general surgery with hydrotherapy  initiated. Hospital course pain management. She remains on chronic Xarelto as prior to admission. Acute on chronic anemia latest hemoglobin 8.5. Klebsiella UTIcompleting a course of Rocephin. Tolerating a regular diet. Therapy evaluations completed and patient was admitted for a comprehensive rehab program. Patient transferred to CIR on 08/17/2018 .   Patient currently requires max with mobility secondary to muscle weakness and muscle paralysis, abnormal tone, unbalanced muscle activation and decreased  coordination and decreased sitting balance, decreased postural control and decreased balance strategies.  Prior to hospitalization, patient was min with mobility and lived with Family(sister and brother in [place]) in a House home.  Home access is  Ramped entrance.  Patient will benefit from skilled PT intervention to maximize safe functional mobility, minimize fall risk and decrease caregiver burden for planned discharge home with 24 hour assist.  Anticipate patient will benefit from follow up [facility] at discharge.  PT - End of Session Activity Tolerance: Tolerates 30+ min activity with multiple rests Endurance Deficit: Yes PT Assessment Rehab Potential (ACUTE/IP ONLY): Good PT Barriers to Discharge: Medical stability;Incontinence;Neurogenic Bowel & Bladder;Wound Care PT Patient demonstrates impairments in the following area(s): Balance;Endurance;Motor;Safety;Sensory;Skin Integrity PT Transfers Functional Problem(s): Bed Mobility;Bed to Chair;Car;Furniture;Floor PT Locomotion Functional Problem(s): Ambulation;Wheelchair Mobility;Stairs PT Plan PT Intensity: Minimum of 1-2 x/day ,45 to 90 minutes PT Frequency: 5 out of 7 days PT Duration Estimated Length of Stay: 12-14 days PT Treatment/Interventions: Balance/vestibular training;Community reintegration;Discharge planning;Disease management/prevention;DME/adaptive equipment instruction;Functional mobility training;Neuromuscular re-education;Pain management;Patient/family education;Psychosocial support;Skin care/wound management;Therapeutic Activities;Therapeutic Exercise;UE/LE Strength taining/ROM;UE/LE Coordination activities;Wheelchair propulsion/positioning PT Transfers Anticipated Outcome(s): mod A PT Locomotion Anticipated Outcome(s): mod I once in power w/c PT Recommendation Recommendations for Other Services: Neuropsych consult;Therapeutic Recreation consult Therapeutic Recreation Interventions: Stress management Follow Up Recommendations: Home  health PT Patient destination: Home Equipment Recommended: (pt already owns all necessary equipment)  Skilled Therapeutic Intervention Evaluation completed (see details above and below) with education on PT POC and goals and individual treatment initiated with focus on bed mobility, functional transfer assessment, power w/c mobility assessment. Pt received semi-reclined in bed, agreeable to PT eval. Rolling L/R with min A and use of bedrails for dependent donning of TED hose, pants, and socks. Supine to sitting EOB with max A with HOB elevated. Slide board transfer bed to w/c with max A. Pt exhibits increased anxiety while sitting EOB and needs max encouragement for scooting towards EOB to place BLE on the floor for improved stability. Power w/c mobility x 150 ft with Supervision. Slide board transfer w/c to bed with max A. Sit to supine mod A for BLE management. Pt left semi-reclined in bed with needs in reach at end of session.    PT Evaluation Precautions/Restrictions Precautions Precautions: Fall Precaution Comments: stage 4 sacral wound Restrictions Weight Bearing Restrictions: No Pain Pain Assessment Pain Scale: 0-10 Pain Score: 0-No pain Home Living/Prior Functioning Home Living Available Help at Discharge: Available PRN/intermittently Type of Home: House Home Access: Ramped entrance Home Layout: One level Bathroom Accessibility: No Additional Comments: Bathroom is not accessible.  Pt sponge bathes and performs bowel program supine.  uses sliding board for transfers to power wheelchair  Lives With: Family(sister and brother in law) Prior Function Level of Independence: Needs assistance with tranfers Bath: Moderate Toileting: Total Dressing: Maximal Grooming: Minimal  Able to Take Stairs?: No Driving: No Comments: pt has been non-ambulatory about 1 year, was on CIR in July 2019 Vision/Perception  Perception Perception: Within Functional Limits Praxis Praxis: Intact   Cognition Overall Cognitive Status: Within Functional Limits for  tasks assessed Arousal/Alertness: Awake/alert Orientation Level: Oriented X4 Attention: Focused Focused Attention: Appears intact Memory: Appears intact Awareness: Appears intact Problem Solving: Appears intact Safety/Judgment: Appears intact Sensation Sensation Light Touch: Impaired Detail Peripheral sensation comments: T-3 paraplegia Light Touch Impaired Details: Absent RLE;Absent LLE Proprioception: Impaired Detail Proprioception Impaired Details: Absent RLE;Absent LLE Coordination Gross Motor Movements are Fluid and Coordinated: No Fine Motor Movements are Fluid and Coordinated: Yes Coordination and Movement Description: impaired 2/2 paraplegia Motor  Motor Motor: Paraplegia;Abnormal tone;Abnormal postural alignment and control Motor - Skilled Clinical Observations: T3 paraplegia  Mobility Bed Mobility Bed Mobility: Rolling Right;Rolling Left;Supine to Sit;Sit to Supine Rolling Right: Minimal Assistance - Patient > 75% Rolling Left: Minimal Assistance - Patient > 75% Supine to Sit: Maximal Assistance - Patient - Patient 25-49% Sit to Supine: Moderate Assistance - Patient 50-74% Transfers Transfers: Lateral/Scoot Transfers Lateral/Scoot Transfers: Maximal Assistance - Patient 25-49% Transfer (Assistive device): Other (Comment)(slide board) Artist / Additional Locomotion Stairs: No Architect: Yes Wheelchair Assistance: Chartered loss adjuster: Power Wheelchair Parts Management: Needs assistance Distance: 150  Trunk/Postural Assessment  Cervical Assessment Cervical Assessment: Exceptions to WFL(forward head) Thoracic Assessment Thoracic Assessment: Exceptions to WFL(kyphotic, rounded shoulders) Lumbar Assessment Lumbar Assessment: Exceptions to WFL(posterior pelvic tilt) Postural Control Postural Control: Deficits on  evaluation Trunk Control: Impaired Protective Responses: Insufficient  Balance Balance Balance Assessed: Yes Static Sitting Balance Static Sitting - Balance Support: Feet supported;Bilateral upper extremity supported Static Sitting - Level of Assistance: 4: Min assist Static Sitting - Comment/# of Minutes: Sitting EOB; pt very fearful/anxious with unsupported sitting Dynamic Sitting Balance Dynamic Sitting - Balance Support: During functional activity;Feet supported;Bilateral upper extremity supported Dynamic Sitting - Level of Assistance: 4: Min assist;3: Mod assist Dynamic Sitting Balance - Compensations: B UE support, fearful to let go with UEs Sitting balance - Comments: decreased core strength and stability to sit independently at EOB.  Extremity Assessment  RUE Assessment RUE Assessment: Within Functional Limits Active Range of Motion (AROM) Comments: ~3/4 full ROM General Strength Comments: 4/5 throughout LUE Assessment LUE Assessment: Within Functional Limits Active Range of Motion (AROM) Comments: ~3/4 AROM General Strength Comments: 4/5 throughout RLE Assessment RLE Assessment: Exceptions to [facility] General Strength Comments: impaired, see below RLE Strength RLE Overall Strength: Deficits(1/5 at hips) LLE Assessment LLE Assessment: Exceptions to [facility] General Strength Comments: impaired, see below LLE Strength LLE Overall Strength: Deficits(1/5 at hips)    Refer to Care Plan for Long Term Goals  Recommendations for other services: Neuropsych and Therapeutic Recreation  Stress management  Discharge Criteria: Patient will be discharged from PT if patient refuses treatment 3 consecutive times without medical reason, if treatment goals not met, if there is a change in medical status, if patient makes no progress towards goals or if patient is discharged from hospital.  The above assessment, treatment plan, treatment alternatives and goals were discussed and mutually agreed  upon: by patient   Excell Seltzer, PT, DPT 08/18/2018, 4:20 PM

## 2018-08-18 NOTE — Evaluation (Signed)
Recreational Therapy Assessment and Plan  Patient Details  Name: Sarah [name] MRN: [id number] Date of Birth: [birth year] Today's Date: 08/18/2018  Rehab Potential: Good ELOS:   2 weeks  Assessment    Problem List:      Patient Active Problem List   Diagnosis Date Noted  . Febrile illness   . Bacterial skin infection 08/10/2018  . Eustachian tube disorder, left 06/30/2018  . Urinary tract infection 06/30/2018  . Malignant neoplasm ([place]) 06/01/2018  . Metastasis to brain ([place]) 06/01/2018  . Transaminitis 04/09/2018  . Hypoalbuminemia 04/09/2018  . Dyspnea   . Multifocal pneumonia   . Palliative care by specialist   . Goals of care, counseling/discussion   . DNR (do not resuscitate) discussion   . Fecal impaction ([place]) 02/06/2018  . HCAP (healthcare-associated pneumonia) 02/06/2018  . Anemia   . Chronic anticoagulation   . Sacral ulcer ([place]) 02/04/2018  . Fracture   . Acute lower UTI   . Myelopathy ([place]) 09/21/2017  . Muscle spasms of both lower extremities   . Closed fracture of shaft of left femur ([place])   . Acute blood loss anemia   . Hypothyroidism   . Nausea & vomiting 09/15/2017  . Closed fracture of left distal femur ([place]) 09/11/2017  . Paraplegia ([place]) 09/08/2017  . Obesity (BMI 30-39.9) 09/03/2017  . Status post surgery 09/03/2017  . Calculus of gallbladder without cholecystitis without obstruction   . Biliary dyskinesia   . Intractable nausea and vomiting 08/09/2017  . Thrombocytopenia ([place]) 08/09/2017  . Elevated bilirubin 08/09/2017  . AKI (acute kidney injury) ([place]) 08/09/2017  . Chest pain 01/26/2017  . Upper respiratory infection, viral 01/26/2017  . Counseling regarding advanced care planning and goals of care 01/02/2017  . Bilateral rotator cuff syndrome 02/12/2016  . E. coli UTI 08/01/2015  . Constipation due to neurogenic bowel 08/01/2015  . 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 Spastic paraparesis   . Neurogenic bladder   .  History of pulmonary embolism   . Post-operative pain   . Metastatic cancer ([place])   . Steroid-induced hyperglycemia   . Depression   . Obstipation   . Thoracic spine tumor 07/06/2015  . 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 ([place]) 07/06/2015  . Spinal cord compression due to malignant neoplasm metastatic to spine ([place])   . Postoperative hypothyroidism   . 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 Spasticity 09/19/2014  . Hypertension 05/01/2014  . Ingrown right big toenail 05/01/2014  . GERD (gastroesophageal reflux disease) 02/14/2014  . Dysphagia, pharyngoesophageal phase 02/06/2014  . Type 2 diabetes mellitus without complication ([place]) 51/76/1607  . Constipation   . Neurogenic bowel   . Paraplegia at T4 level ([place]) 01/20/2014  . Metastatic cancer to spine ([place]) 01/19/2014  . Rotator cuff tendonitis   . 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 Leg weakness, bilateral 12/13/2013    Past Medical History:      Past Medical History:  Diagnosis Date  . Anxiety   . Cancer [facility])    FNA of thyroid positive for onconytic/hurthle cell carcinoma  . Chronic back pain   . Complication of anesthesia    Nausea and vomiting  . DDD (degenerative disc disease), cervical   . Depression   . DJD (degenerative joint disease)   . Family history of adverse reaction to anesthesia    MOTHER HAS NAUSEA   . GERD (gastroesophageal reflux disease)   .  History of blood transfusion    08/2017  . History of rectal fissure   . HIT (heparin-induced thrombocytopenia) ([place])   . Hypertension    05/27/2018- not current- runs low  . Hypothyroidism   . Obesity   . 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 Pneumonia 03/2018  . Pulmonary embolus, right ([place]) 2015  . Rotator cuff tendonitis right   Past Surgical History:       Past Surgical History:  Procedure Laterality Date  .  APPLICATION OF CRANIAL NAVIGATION N/A 06/01/2018   Procedure: APPLICATION OF CRANIAL NAVIGATION;  Surgeon: Erline Levine, MD;  Location: [place];  Service: Neurosurgery;  Laterality: N/A;  . CHOLECYSTECTOMY N/A 01/25/2018   Procedure: LAPAROSCOPIC CHOLECYSTECTOMY;  Surgeon: Virl Cagey, MD;  Location: [facility];  Service: General;  Laterality: N/A;  . CRANIOTOMY N/A 06/01/2018   Procedure: Craniectomy for skull metastasis with Brainlab;  Surgeon: Erline Levine, MD;  Location: [place];  Service: Neurosurgery;  Laterality: N/A;  . FEMUR IM NAIL Left 09/11/2017   Procedure: INTRAMEDULLARY (IM) RETROGRADE FEMORAL NAILING;  Surgeon: Altamese [place], MD;  Location: [place];  Service: Orthopedics;  Laterality: Left;  . LAMINECTOMY N/A 12/14/2013   Procedure: THORACIC LAMINECTOMY FOR TUMOR THORACIC THREE;  Surgeon: Ashok Pall, MD;  Location: [place] NEURO ORS;  Service: Neurosurgery;  Laterality: N/A;  THORACIC LAMINECTOMY FOR TUMOR THORACIC THREE  . LAMINECTOMY N/A 07/05/2015   Procedure: THORACIC LAMINECTOMY FOR TUMOR;  Surgeon: Ashok Pall, MD;  Location: [place] NEURO ORS;  Service: Neurosurgery;  Laterality: N/A;  . LAMINECTOMY N/A 09/03/2017   Procedure: POSTERIOR SPINAL TUMOR RESECTION THORACIC THREE;  Surgeon: Ashok Pall, MD;  Location: [place];  Service: Neurosurgery;  Laterality: N/A;  POSTERIOR SPINAL TUMOR RESECTION THORACIC THREE  . POSTERIOR LUMBAR FUSION 4 LEVEL N/A 12/30/2013   Procedure: Thoracic one-Thoracic five posterior thoracic fusion with pedicle screws;  Surgeon: Ashok Pall, MD;  Location: [place] NEURO ORS;  Service: Neurosurgery;  Laterality: N/A;  Thoracic one-Thoracic five posterior thoracic fusion with pedicle screws  . THYROIDECTOMY N/A 01/09/2014   Procedure: TOTAL THYROIDECTOMY;  Surgeon: Izora Gala, MD;  Location: [place];  Service: ENT;  Laterality: N/A;  . TONSILLECTOMY       Clinical Impression: Sarah Mcclintock. [name] is a 53 year old right-handed female history of hypertension,  CKD stage III with creatinine 1.11 pulmonary emboli maintained on Xarelto, myelopathy and paraplegia secondary to metastaticHurthlecancer to the thoracic spine status post recurrence of T3 spinal tumor with resection in June 2019 followed by Dr. Melchor Amour has received inpatient rehab services in the past last being 09/21/2017 until 10/30/2017 was discharged to [facility] skilled nursing facility. Patient has since returned home with her sister and brother-in-law nonambulatory using a sliding board with 1 person assist for bed to wheelchair transfers. Sister and brother-in-law all assist in all aspects of bathing dressing toileting and functional mobility. They live in a 1 level home with ramped entrance. Presented 08/10/2018 with severe sacral decubitus ulcer that began in late February gradually worsened since that time as well as fever of 102.5, WBC 16,800, hemoglobin 6.1. Initial blood culture from 518 with growth in 1 bottle GPC likely contaminant. Follow-up blood cultures show no growth to date. Placed on intravenous broad-spectrum antibiotics and transitionedto p.oclindamycin completed 08/17/2018. CT the pelvis showed right inferior pubic ramus region gas containing decubitus wound new since February PET/CTbut no CT evidence of osteomyelitis. Underwent bedside debridement per general surgery with hydrotherapy initiated. Hospital course pain management.  She remains on chronic Xarelto as prior to admission. Acute on chronic anemia latest hemoglobin 8.5. Klebsiella UTIcompleting a course of Rocephin. Tolerating a regular diet. Therapy evaluations completed and patient was admitted for a comprehensive rehab program.  Patient transferred to CIR on 08/17/2018.  Pt presents with decreased activity tolerance, decreased functional mobility, decreased balance Limiting pt's independence with leisure/community pursuits.  Plan  Min 1 TR session >20 minutes per  week  Recommendations for other services: Neuropsych  Discharge Criteria: Patient will be discharged from TR if patient refuses treatment 3 consecutive times without medical reason.  If treatment goals not met, if there is a change in medical status, if patient makes no progress towards goals or if patient is discharged from hospital.  The above assessment, treatment plan, treatment alternatives and goals were discussed and mutually agreed upon: by patient  [place] 08/18/2018, 2:29 PM

## 2018-08-18 NOTE — Evaluation (Signed)
Occupational Therapy Assessment and Plan  Patient Details  Name: Sarah [name] MRN: [id number] Date of Birth: 1965-04-17  OT Diagnosis: ataxia, muscle weakness (generalized) and paraplegia at level T-3 Rehab Potential: Rehab Potential (ACUTE ONLY): Good ELOS: ~2 weeks   Today's Date: 08/18/2018 OT Individual Time: 0737-1062 and 1415-1455 OT Individual Time Calculation (min): 40 min And 40 min  Problem List:  Patient Active Problem List   Diagnosis Date Noted  . Febrile illness   . Bacterial skin infection 08/10/2018  . Eustachian tube disorder, left 06/30/2018  . Urinary tract infection 06/30/2018  . Malignant neoplasm ([place]) 06/01/2018  . Metastasis to brain ([place]) 06/01/2018  . Transaminitis 04/09/2018  . Hypoalbuminemia 04/09/2018  . Dyspnea   . Multifocal pneumonia   . Palliative care by specialist   . Goals of care, counseling/discussion   . DNR (do not resuscitate) discussion   . Fecal impaction ([place]) 02/06/2018  . HCAP (healthcare-associated pneumonia) 02/06/2018  . Anemia   . Chronic anticoagulation   . Sacral ulcer ([place]) 02/04/2018  . Fracture   . Acute lower UTI   . Myelopathy ([place]) 09/21/2017  . Muscle spasms of both lower extremities   . Closed fracture of shaft of left femur ([place])   . Acute blood loss anemia   . Hypothyroidism   . Nausea & vomiting 09/15/2017  . Closed fracture of left distal femur ([place]) 09/11/2017  . Paraplegia ([place]) 09/08/2017  . Obesity (BMI 30-39.9) 09/03/2017  . Status post surgery 09/03/2017  . Calculus of gallbladder without cholecystitis without obstruction   . Biliary dyskinesia   . Intractable nausea and vomiting 08/09/2017  . Thrombocytopenia ([place]) 08/09/2017  . Elevated bilirubin 08/09/2017  . AKI (acute kidney injury) ([place]) 08/09/2017  . Chest pain 01/26/2017  . Upper respiratory infection, viral 01/26/2017  . Counseling regarding advanced care planning and goals of care 01/02/2017  . Bilateral rotator cuff syndrome  02/12/2016  . E. coli UTI 08/01/2015  . Constipation due to neurogenic bowel 08/01/2015  . 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 Spastic paraparesis   . Neurogenic bladder   . History of pulmonary embolism   . Post-operative pain   . Metastatic cancer ([place])   . Steroid-induced hyperglycemia   . Depression   . Obstipation   . Thoracic spine tumor 07/06/2015  . 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 ([place]) 07/06/2015  . Spinal cord compression due to malignant neoplasm metastatic to spine ([place])   . Postoperative hypothyroidism   . 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 Spasticity 09/19/2014  . Hypertension 05/01/2014  . Ingrown right big toenail 05/01/2014  . GERD (gastroesophageal reflux disease) 02/14/2014  . Dysphagia, pharyngoesophageal phase 02/06/2014  . Type 2 diabetes mellitus without complication ([place]) 69/48/5462  . Constipation   . Neurogenic bowel   . Paraplegia at T4 level ([place]) 01/20/2014  . Metastatic cancer to spine ([place]) 01/19/2014  . Rotator cuff tendonitis   . 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 Leg weakness, bilateral 12/13/2013    Past Medical History:  Past Medical History:  Diagnosis Date  . Anxiety   . Cancer [facility])    FNA of thyroid positive for onconytic/hurthle cell carcinoma  . Chronic back pain   . Complication of anesthesia    Nausea and vomiting  . DDD (degenerative disc disease), cervical   . Depression   . DJD (degenerative joint disease)   . Family history of adverse  reaction to anesthesia    MOTHER HAS NAUSEA   . GERD (gastroesophageal reflux disease)   . History of blood transfusion    08/2017  . History of rectal fissure   . HIT (heparin-induced thrombocytopenia) ([place])   . Hypertension    05/27/2018- not current- runs low  . Hypothyroidism   . Obesity   . Paraplegia at T4 level ([place])   . Pneumonia 03/2018  . Pulmonary embolus, right  ([place]) 2015  . Rotator cuff tendonitis right   Past Surgical History:  Past Surgical History:  Procedure Laterality Date  . APPLICATION OF CRANIAL NAVIGATION N/A 06/01/2018   Procedure: APPLICATION OF CRANIAL NAVIGATION;  Surgeon: Erline Levine, MD;  Location: [place];  Service: Neurosurgery;  Laterality: N/A;  . CHOLECYSTECTOMY N/A 01/25/2018   Procedure: LAPAROSCOPIC CHOLECYSTECTOMY;  Surgeon: Virl Cagey, MD;  Location: [facility];  Service: General;  Laterality: N/A;  . CRANIOTOMY N/A 06/01/2018   Procedure: Craniectomy for skull metastasis with Brainlab;  Surgeon: Erline Levine, MD;  Location: [place];  Service: Neurosurgery;  Laterality: N/A;  . FEMUR IM NAIL Left 09/11/2017   Procedure: INTRAMEDULLARY (IM) RETROGRADE FEMORAL NAILING;  Surgeon: Altamese Montello, MD;  Location: [facility];  Service: Orthopedics;  Laterality: Left;  . LAMINECTOMY N/A 12/14/2013   Procedure: THORACIC LAMINECTOMY FOR TUMOR THORACIC THREE;  Surgeon: Ashok Pall, MD;  Location: [facility] [facility];  Service: Neurosurgery;  Laterality: N/A;  THORACIC LAMINECTOMY FOR TUMOR THORACIC THREE  . LAMINECTOMY N/A 07/05/2015   Procedure: THORACIC LAMINECTOMY FOR TUMOR;  Surgeon: Ashok Pall, MD;  Location: [facility];  Service: Neurosurgery;  Laterality: N/A;  . LAMINECTOMY N/A 09/03/2017   Procedure: POSTERIOR SPINAL TUMOR RESECTION THORACIC THREE;  Surgeon: Ashok Pall, MD;  Location: [facility];  Service: Neurosurgery;  Laterality: N/A;  POSTERIOR SPINAL TUMOR RESECTION THORACIC THREE  . POSTERIOR LUMBAR FUSION 4 LEVEL N/A 12/30/2013   Procedure: Thoracic one-Thoracic five posterior thoracic fusion with pedicle screws;  Surgeon: Ashok Pall, MD;  Location: [place] [facility];  Service: Neurosurgery;  Laterality: N/A;  Thoracic one-Thoracic five posterior thoracic fusion with pedicle screws  . THYROIDECTOMY N/A 01/09/2014   Procedure: TOTAL THYROIDECTOMY;  Surgeon: Izora Gala, MD;  Location: [facility];  Service: ENT;  Laterality: N/A;  .  TONSILLECTOMY      Assessment & Plan Clinical Impression: Bo Mcclintock. Christo is a 53 year old right-handed female history of hypertension, CKD stage III with creatinine 1.11 pulmonary emboli maintained on Xarelto, myelopathy and paraplegia secondary to metastaticHurthlecancer to the thoracic spine status post recurrence of T3 spinal tumor with resection in June 2019 followed by Dr. Melchor Amour has received inpatient rehab services in the past last being 09/21/2017 until 10/30/2017 was discharged to [facility] skilled nursing facility. Patient has since returned home with her sister and brother-in-law nonambulatory using a sliding board with 1 person assist for bed to wheelchair transfers. Sister and brother-in-law all assist in all aspects of bathing dressing toileting and functional mobility. They live in a 1 level home with ramped entrance. Presented 08/10/2018 with severe sacral decubitus ulcer that began in late February gradually worsened since that time as well as fever of 102.5, WBC 16,800, hemoglobin 6.1. Initial blood culture from 518 with growth in 1 bottle GPC likely contaminant. Follow-up blood cultures show no growth to date. Placed on intravenous broad-spectrum antibiotics and transitionedto p.oclindamycin completed 08/17/2018. CT the pelvis showed right inferior pubic ramus region gas containing decubitus wound new since February PET/CTbut no CT  evidence of osteomyelitis. Underwent bedside debridement per general surgery with hydrotherapy initiated. Hospital course pain management. She remains on chronic Xarelto as prior to admission. Acute on chronic anemia latest hemoglobin 8.5. Klebsiella UTIcompleting a course of Rocephin. Tolerating a regular diet. Therapy evaluations completed and patient was admitted for a comprehensive rehab program.  Patient transferred to CIR on 08/17/2018 .    Patient currently requires max- total A with basic self-care skills  secondary to muscle weakness and muscle paralysis, decreased cardiorespiratoy endurance, decreased coordination and decreased sitting balance, decreased postural control and decreased balance strategies.  Prior to hospitalization, patient could complete ADLs with mod.  Patient will benefit from skilled intervention to decrease level of assist with basic self-care skills and increase independence with basic self-care skills prior to discharge home with care partner.  Anticipate patient will require moderate physical assestance and follow up home health.  OT - End of Session Activity Tolerance: Tolerates 10 - 20 min activity with multiple rests Endurance Deficit: Yes OT Assessment Rehab Potential (ACUTE ONLY): Good OT Barriers to Discharge: Wound Care OT Patient demonstrates impairments in the following area(s): Balance;Safety;Endurance;Sensory;Motor;Skin Integrity OT Basic ADL's Functional Problem(s): Grooming;Bathing;Dressing;Toileting OT Additional Impairment(s): None OT Plan OT Intensity: Minimum of 1-2 x/day, 45 to 90 minutes OT Frequency: 5 out of 7 days OT Duration/Estimated Length of Stay: ~2 weeks OT Treatment/Interventions: Balance/vestibular training;Discharge planning;DME/adaptive equipment instruction;Functional mobility training;Psychosocial support;Skin care/wound managment;Therapeutic Activities;UE/LE Strength taining/ROM;Wheelchair propulsion/positioning;UE/LE Coordination activities;Therapeutic Exercise;Self Care/advanced ADL retraining;Patient/family education;Neuromuscular re-education;Splinting/orthotics;Disease mangement/prevention OT Self Feeding Anticipated Outcome(s): Indep OT Basic Self-Care Anticipated Outcome(s): Mod A OT Toileting Anticipated Outcome(s): Total A bed level OT Bathroom Transfers Anticipated Outcome(s): n/a. will complete at bed level OT Recommendation Recommendations for Other Services: Therapeutic Recreation consult Therapeutic Recreation  Interventions: Stress management;Outing/community reintergration Patient destination: Home Follow Up Recommendations: Home health OT Equipment Recommended: None recommended by OT Equipment Details: Pt has all needed DME   Skilled Therapeutic Intervention Session One: Pt seen for OT eval and ADL bathing/dressing session. Pt in supine upon arrival, agreeable to tx session and denying pain. Completed UB/LB bathing/dressing from bed level. Pt completed UB bathing with assist for set-up, total A to wash back when pt rolling. LB bathing and dressing total A. She rolled with min A in order for pants to be pulled up total A. UB dressing completed with mod A, pt able to thread head and UEs into shirt, total A to pull down in back when pt rolling. Education regarding d/c decreased sensation in LEs and functional implications. Pt incontinent of urine PTA and on PM bowel program. Discussed with pt and PA regarding risk of cont skin break down and infection. Cont to problem solve with medical team and pt. She transferred to sitting EOB with max A +1 using hospital bed functions. Completed max A +1 sliding board transfer to power w/c.  Pt left seated in power w/c at end of session, set-up with lunch tray and all needs in reach.  Pt familiar with IPR program and OT/PT goals discussed. Pt very motivated to regain as much functional independence as possible.   Session Two: Pt seen for OT session focusing on ADL re-training, upright tolerance and education. Pt sitting up in power w/c upon arrival. Complaints of severe nausea throughout session, no vomiting episode though pt provided with emesis bag. RN made aware and ginger ale provided during session.  Pt requesting to wash hair. Completed hair washing from w/c level at sink, pt able to direct caregiver with way she  does it at home. Pt able to come forward off back of w/c and lean forward into sink independently. She brushed and blow dried hair with set-up. Pt  willing to stay sitting up in w/c at end of session. Reviewed importance of pressure relief every 30 minutes while up in chair. Pt declined need for timer  To remember pressure relief schedule.  Education/discussion throughout session regarding pt's goals, POC, and d/c planning.   OT Evaluation Precautions/Restrictions  Precautions Precautions: Fall Precaution Comments: Stage 4 sacral wound Restrictions Weight Bearing Restrictions: No General Chart Reviewed: Yes Additional Pertinent History: hx T-3 paraplegia from metastatic cancer Home Living/Prior Functioning Home Living Available Help at Discharge: Available PRN/intermittently, Available 24 hours/day Type of Home: House Home Access: Ramped entrance Home Layout: One level Bathroom Accessibility: No Additional Comments: Bathroom is not accessible.  Pt sponge bathes and performs bowel program supine.  uses sliding board for transfers to power wheelchair  Lives With: Family(Sister and brother in law) IADL History Homemaking Responsibilities: No Current License: No Prior Function Level of Independence: Needs assistance with ADLs, Needs assistance with homemaking, Needs assistance with tranfers Bath: Moderate Toileting: Total Dressing: Maximal Grooming: Minimal  Able to Take Stairs?: No Driving: No Comments: pt has been non-ambulatory about 1 year, was on CIR in July 2019 Vision Baseline Vision/History: Wears glasses Wears Glasses: Reading only Patient Visual Report: No change from baseline Vision Assessment?: No apparent visual deficits Perception  Perception: Within Functional Limits Praxis Praxis: Intact Cognition Overall Cognitive Status: Within Functional Limits for tasks assessed Arousal/Alertness: Awake/alert Orientation Level: Person;Place;Situation Person: Oriented Place: Oriented Situation: Oriented Year: 2020 Month: May Day of Week: Correct Memory: Appears intact Immediate Memory Recall:  Sock;Blue;Bed Memory Recall: Sock;Blue;Bed Memory Recall Sock: Without Cue Memory Recall Blue: Without Cue Memory Recall Bed: Without Cue Attention: Focused Focused Attention: Appears intact Awareness: Appears intact Problem Solving: Appears intact Safety/Judgment: Appears intact Sensation Sensation Light Touch: Impaired Detail Peripheral sensation comments: T-3 paraplegia Light Touch Impaired Details: Absent RLE;Absent LLE Proprioception: Impaired Detail Proprioception Impaired Details: Absent RLE;Absent LLE Coordination Gross Motor Movements are Fluid and Coordinated: No Fine Motor Movements are Fluid and Coordinated: Yes Coordination and Movement Description: impaired 2/2 paraplegia Motor  Motor Motor: Paraplegia;Abnormal tone;Abnormal postural alignment and control Motor - Skilled Clinical Observations: T3 paraplegia Trunk/Postural Assessment  Cervical Assessment Cervical Assessment: Exceptions to WFL(Forward head) Thoracic Assessment Thoracic Assessment: Exceptions to WFL(Severe kyphosis and rounded shoulders) Lumbar Assessment Lumbar Assessment: Exceptions to WFL(Posterior pelvic tilt) Postural Control Postural Control: Deficits on evaluation Trunk Control: Impaired Protective Responses: Insufficient  Balance Balance Balance Assessed: Yes Static Sitting Balance Static Sitting - Balance Support: Feet supported;Bilateral upper extremity supported Static Sitting - Level of Assistance: 4: Min assist Static Sitting - Comment/# of Minutes: Sitting EOB; pt very fearful/anxious with unsupported sitting Dynamic Sitting Balance Dynamic Sitting - Balance Support: During functional activity;Feet supported;Bilateral upper extremity supported Dynamic Sitting - Level of Assistance: 4: Min assist;3: Mod assist Dynamic Sitting Balance - Compensations: B UE support, fearful to let go with UEs Sitting balance - Comments: decreased core strength and stability to sit independently at  EOB.  Extremity/Trunk Assessment RUE Assessment RUE Assessment: Within Functional Limits Active Range of Motion (AROM) Comments: ~3/4 full ROM General Strength Comments: 4/5 throughout LUE Assessment LUE Assessment: Within Functional Limits Active Range of Motion (AROM) Comments: ~3/4 AROM General Strength Comments: 4/5 throughout     Refer to Care Plan for Long Term Goals  Recommendations for other services: Therapeutic Recreation  Stress management  Discharge Criteria: Patient will be discharged from OT if patient refuses treatment 3 consecutive times without medical reason, if treatment goals not met, if there is a change in medical status, if patient makes no progress towards goals or if patient is discharged from hospital.  The above assessment, treatment plan, treatment alternatives and goals were discussed and mutually agreed upon: by patient  Urijah Raynor L 08/18/2018, 3:32 PM

## 2018-08-18 NOTE — Consult Note (Signed)
[place] Nurse wound follow up Wound type:Bilateral ischial wounds.  Wounds are being monitored under [facility] service and include topical wound care and hydrotherapy for debridement of devitalized tissue. Patient heels are offloaded with Prevalon boots.  [name] on mattress with low air loss feature and continues to be turned and repositioned every two hours by bedside staff. Patient has paraplegia and metastatic spine cancer.   Albumin this AM is 2.2.  [name] on regular diet and last Braden score for nutrition was adequate (3).  Due to cancer and low albumin, impaired healing is likely.  Will optimize healing with the above mentioned interventions.  Continue Santyl dressings/hydrotherapy and surgical service oversight as ordered.   Will not follow at this time.  Please re-consult if needed.  Domenic Moras MSN, RN, FNP-BC CWON Wound, Ostomy, Continence Nurse Pager [phone]

## 2018-08-18 NOTE — Plan of Care (Signed)
See POC for OT goal details. Keshayla Schrum, OTR/L

## 2018-08-18 NOTE — Progress Notes (Addendum)
[place] PHYSICAL MEDICINE & REHABILITATION PROGRESS NOTE   Subjective/Complaints: Pt complains of legs being tight, hypersensitive to touch. Had a fair night overall. Ready for therapies when I walked in this morning  ROS: Patient denies fever, rash, sore throat, blurred vision, nausea, vomiting, diarrhea, cough, shortness of breath or chest pain, joint or back pain, headache, or mood change.    Objective:   No results found. Recent Labs    08/17/18 0403 08/18/18 0512  WBC 4.5 3.9*  HGB 8.9* 9.3*  HCT 28.0* 28.7*  PLT 217 220   Recent Labs    08/17/18 0403 08/18/18 0512  NA 137 138  K 3.7 3.9  CL 103 100  CO2 24 22  GLUCOSE 70 61*  BUN 9 9  CREATININE 1.11* 1.21*  CALCIUM 8.1* 8.1*    Intake/Output Summary (Last 24 hours) at 08/18/2018 1626 Last data filed at 08/18/2018 1428 Gross per 24 hour  Intake 440 ml  Output 100 ml  Net 340 ml     Physical Exam: Vital Signs Blood pressure 103/67, pulse 78, temperature 98.6 F (37 C), temperature source Oral, resp. rate 18, height 5\' 11"  (1.803 m), last menstrual period 10/22/2016, SpO2 100 %. Constitutional: No distress . Vital signs reviewed. HEENT: EOMI, oral membranes moist Neck: supple Cardiovascular: RRR without murmur. No JVD    Respiratory: CTA Bilaterally without wheezes or rales. Normal effort    GI: BS +, non-tender, non-distended  Musculoskeletal:  General: Edemapresent.  Neurological: She isalertand oriented to person, place, and time. Nocranial nerve deficit. [name] alert in no acute distress. Follows full commands. Center Line. UE 4/5. LE: 1-2/5 prox to distal with extensor tone. DTR's 3+.resting tone 2/4 extensor in both legs, rigth more than left.  Decreased LT and pain in both legs. Leg slightly hypersensitive to touch. Skin: Skin iswarm. She isnot diaphoretic. Buttock wounds dressed. Did not visualize today Psychiatric: She has a normal mood and affect. Herbehavior  is normal.    Assessment/Plan: 1. Functional deficits secondary to debility related to decubitus ulcers/UTI in setting of thoracic paraplegia which require 3+ hours per day of interdisciplinary therapy in a comprehensive inpatient rehab setting.  Physiatrist is providing close team supervision and 24 hour management of active medical problems listed below.  Physiatrist and rehab team continue to assess barriers to discharge/monitor patient progress toward functional and medical goals  Care Tool:  Bathing    Body parts bathed by patient: Right arm, Left arm, Chest, Abdomen, Face   Body parts bathed by helper: Front perineal area, Buttocks, Right upper leg, Left upper leg, Right lower leg, Left lower leg     Bathing assist Assist Level: Maximal Assistance - Patient 24 - 49%     Upper Body Dressing/Undressing Upper body dressing   What is the patient wearing?: Bra, Pull over shirt    Upper body assist Assist Level: Moderate Assistance - Patient 50 - 74%    Lower Body Dressing/Undressing Lower body dressing      What is the patient wearing?: Pants, Incontinence brief     Lower body assist Assist for lower body dressing: Total Assistance - Patient < 25%     Toileting Toileting    Toileting assist Assist for toileting: Dependent - Patient 0%     Transfers Chair/bed transfer  Transfers assist     Chair/bed transfer assist level: Maximal Assistance - Patient 25 - 49%     Locomotion Ambulation   Ambulation assist   Ambulation activity did  not occur: N/A          Walk 10 feet activity   Assist  Walk 10 feet activity did not occur: N/A        Walk 50 feet activity   Assist Walk 50 feet with 2 turns activity did not occur: N/A         Walk 150 feet activity   Assist Walk 150 feet activity did not occur: N/A         Walk 10 feet on uneven surface  activity   Assist Walk 10 feet on uneven surfaces activity did not occur: N/A          Wheelchair     Assist Will patient use wheelchair at discharge?: Yes Type of Wheelchair: Power    Wheelchair assist level: Supervision/Verbal cueing Max wheelchair distance: 150'    Wheelchair 50 feet with 2 turns activity    Assist        Assist Level: Supervision/Verbal cueing   Wheelchair 150 feet activity     Assist     Assist Level: Supervision/Verbal cueing    Medical Problem List and Plan: 1.Debility/paraplegiasecondary to sacral decubitus status post debridement with history of metastaticHurthlecancer followed by Dr.Kale -[name] beginning CIR therapies today including PT and OT  2. Antithrombotics: -DVT/anticoagulation:Chronic Xarelto for history of pulmonary emboli -antiplatelet therapy: N/A 3. Pain Management:Baclofen 20 mg 3 times daily, oxycodone as needed  -add lyrica  for neuropathic pain in legs  -consider further baclofen titration as well 4. Mood:Zoloft 50 mg daily -antipsychotic agents: N/A 5. Neuropsych: This patientiscapable of making decisions on herown behalf. 6. Skin/Wound Care:Continuesantyl andhydrotherapy.   -eventual Vac placement when adequate fibronecrotic tissue removal 7. Fluids/Electrolytes/Nutrition: encourage PO  -BUN/Cr stable 8.Acute on chronic anemia. hgb 9.3 today  -fe++ supp 9.Neurogenic bowel and bladderas well as Klebsiella UTI.   Establish bowel program in evening  -I/O cath schedule, voiding trial  - Complete course of Rocephin 10. Hypothyroidism. Synthroid    LOS: 1 days A FACE TO FACE EVALUATION WAS PERFORMED  [name] 08/18/2018, 4:26 PM

## 2018-08-18 NOTE — Progress Notes (Signed)
Physical Therapy Wound Treatment Patient Details  Name: Sarah [name] MRN: [id number] Date of Birth: 02/26/1966  Today's Date: 08/18/2018 PT Individual Time: 1020-1125 PT Individual Time Calculation (min): 65 min   Patient Active Problem List   Diagnosis Date Noted  . Febrile illness   . Bacterial skin infection 08/10/2018  . Eustachian tube disorder, left 06/30/2018  . Urinary tract infection 06/30/2018  . Malignant neoplasm ([place]) 06/01/2018  . Metastasis to brain ([place]) 06/01/2018  . Transaminitis 04/09/2018  . Hypoalbuminemia 04/09/2018  . Dyspnea   . Multifocal pneumonia   . Palliative care by specialist   . Goals of care, counseling/discussion   . DNR (do not resuscitate) discussion   . Fecal impaction ([place]) 02/06/2018  . HCAP (healthcare-associated pneumonia) 02/06/2018  . Anemia   . Chronic anticoagulation   . Sacral ulcer ([place]) 02/04/2018  . Fracture   . Acute lower UTI   . Myelopathy ([place]) 09/21/2017  . Muscle spasms of both lower extremities   . Closed fracture of shaft of left femur ([place])   . Acute blood loss anemia   . Hypothyroidism   . Nausea & vomiting 09/15/2017  . Closed fracture of left distal femur ([place]) 09/11/2017  . Paraplegia ([place]) 09/08/2017  . Obesity (BMI 30-39.9) 09/03/2017  . Status post surgery 09/03/2017  . Calculus of gallbladder without cholecystitis without obstruction   . Biliary dyskinesia   . Intractable nausea and vomiting 08/09/2017  . Thrombocytopenia ([place]) 08/09/2017  . Elevated bilirubin 08/09/2017  . AKI (acute kidney injury) ([place]) 08/09/2017  . Chest pain 01/26/2017  . Upper respiratory infection, viral 01/26/2017  . Counseling regarding advanced care planning and goals of care 01/02/2017  . Bilateral rotator cuff syndrome 02/12/2016  . E. coli UTI 08/01/2015  . Constipation due to neurogenic bowel 08/01/2015  . 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 Spastic paraparesis   . Neurogenic bladder   . History of pulmonary embolism    . Post-operative pain   . Metastatic cancer ([place])   . Steroid-induced hyperglycemia   . Depression   . Obstipation   . Thoracic spine tumor 07/06/2015  . 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 Spinal cord compression due to malignant neoplasm metastatic to spine ([place])   . Postoperative hypothyroidism   . 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 Spasticity 09/19/2014  . Hypertension 05/01/2014  . Ingrown right big toenail 05/01/2014  . GERD (gastroesophageal reflux disease) 02/14/2014  . Dysphagia, pharyngoesophageal phase 02/06/2014  . Type 2 diabetes mellitus without complication ([place]) 25/00/3704  . Constipation   . Neurogenic bowel   . 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 Rotator cuff tendonitis   . 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 Leg weakness, bilateral 12/13/2013   Past Medical History:  Diagnosis Date  . Anxiety   . Cancer [facility])    FNA of thyroid positive for onconytic/hurthle cell carcinoma  . Chronic back pain   . Complication of anesthesia    Nausea and vomiting  . DDD (degenerative disc disease), cervical   . Depression   . DJD (degenerative joint disease)   . Family history of adverse reaction to anesthesia    MOTHER HAS NAUSEA   . GERD (gastroesophageal reflux disease)   . History of blood transfusion    08/2017  . History of rectal fissure   . 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 Hypertension    05/27/2018- not current- runs low  . Hypothyroidism   . Obesity   . Paraplegia at T4 level ([place])   . Pneumonia 03/2018  . Pulmonary embolus, right ([place]) 2015  . Rotator cuff tendonitis right   Past Surgical History:  Procedure Laterality Date  . APPLICATION OF CRANIAL NAVIGATION N/A 06/01/2018   Procedure: APPLICATION OF CRANIAL NAVIGATION;  Surgeon: Erline Levine, MD;   Location: [facility];  Service: Neurosurgery;  Laterality: N/A;  . CHOLECYSTECTOMY N/A 01/25/2018   Procedure: LAPAROSCOPIC CHOLECYSTECTOMY;  Surgeon: Virl Cagey, MD;  Location: [facility];  Service: General;  Laterality: N/A;  . CRANIOTOMY N/A 06/01/2018   Procedure: Craniectomy for skull metastasis with Brainlab;  Surgeon: Erline Levine, MD;  Location: [place];  Service: Neurosurgery;  Laterality: N/A;  . FEMUR IM NAIL Left 09/11/2017   Procedure: INTRAMEDULLARY (IM) RETROGRADE FEMORAL NAILING;  Surgeon: Altamese Garner, MD;  Location: [place];  Service: Orthopedics;  Laterality: Left;  . LAMINECTOMY N/A 12/14/2013   Procedure: THORACIC LAMINECTOMY FOR TUMOR THORACIC THREE;  Surgeon: Ashok Pall, MD;  Location: [facility] [facility];  Service: Neurosurgery;  Laterality: N/A;  THORACIC LAMINECTOMY FOR TUMOR THORACIC THREE  . LAMINECTOMY N/A 07/05/2015   Procedure: THORACIC LAMINECTOMY FOR TUMOR;  Surgeon: Ashok Pall, MD;  Location: [place] [facility];  Service: Neurosurgery;  Laterality: N/A;  . LAMINECTOMY N/A 09/03/2017   Procedure: POSTERIOR SPINAL TUMOR RESECTION THORACIC THREE;  Surgeon: Ashok Pall, MD;  Location: [facility] [facility];  Service: Neurosurgery;  Laterality: N/A;  POSTERIOR SPINAL TUMOR RESECTION THORACIC THREE  . POSTERIOR LUMBAR FUSION 4 LEVEL N/A 12/30/2013   Procedure: Thoracic one-Thoracic five posterior thoracic fusion with pedicle screws;  Surgeon: Ashok Pall, MD;  Location: [place] [facility];  Service: Neurosurgery;  Laterality: N/A;  Thoracic one-Thoracic five posterior thoracic fusion with pedicle screws  . THYROIDECTOMY N/A 01/09/2014   Procedure: TOTAL THYROIDECTOMY;  Surgeon: Izora Gala, MD;  Location: [facility];  Service: ENT;  Laterality: N/A;  . TONSILLECTOMY      Subjective  Subjective: Pleasant and agreeable to hydrotherapy Patient and Family Stated Goals: Heal wounds Date of Onset: (unknown) Prior Treatments: Bedside I&D  Pain Score:    Objective  Cognition: Impaired:      Communication: WFL  Mobility: Impaired:  ROM: Impaired:  Sensation: not tested Protective sensation (10g): not tested Signs of venous insufficiency:edema Signs of arterial insufficiency: Nt  Wound Assessment     Pressure Injury 08/10/18 Unstageable - Full thickness tissue loss in which the base of the ulcer is covered by slough (yellow, tan, gray, green or brown) and/or eschar (tan, brown or black) in the wound bed. (Active)  Wound Image   08/18/2018 12:43 PM  Dressing Type ABD;Barrier Film (skin prep);Moist to dry;Tape dressing 08/18/2018 12:43 PM  Dressing Changed 08/18/2018 12:43 PM  Dressing Change Frequency Daily 08/18/2018 12:43 PM  State of Healing Early/partial granulation 08/18/2018 12:43 PM  Site / Wound Assessment Black;Dusky;Granulation tissue;Yellow 08/18/2018 12:43 PM  % Wound base Red or Granulating 50% 08/18/2018 12:43 PM  % Wound base Yellow/Fibrinous Exudate 40% 08/18/2018 12:43 PM  % Wound base Black/Eschar 10% 08/18/2018 12:43 PM  Peri-wound Assessment Intact;Induration 08/18/2018 12:43 PM  Wound Length (cm) 9 cm 08/18/2018 12:43 PM  Wound Width (cm) 8 cm 08/18/2018 12:43 PM  Wound Depth (cm) 5.5 cm 08/18/2018 12:43 PM  Wound Surface Area (cm^2) 72 cm^2 08/18/2018 12:43 PM  Wound Volume (cm^3) 396 cm^3 08/18/2018 12:43 PM  Tunneling (cm) 0 08/11/2018  1:36 PM  Undermining (cm) 0 08/11/2018  1:36 PM  Margins Unattached edges (unapproximated) 08/18/2018 12:43 PM  Drainage Amount Minimal 08/18/2018 12:43 PM  Drainage Description Serosanguineous 08/18/2018 12:43 PM  Treatment Cleansed;Debridement (Selective);Hydrotherapy (Pulse lavage);Packing (Saline gauze) 08/18/2018 12:43 PM     Pressure Injury 08/10/18 Unstageable - Full thickness tissue loss in which the base of the ulcer is covered by slough (yellow, tan, gray, green or brown) and/or eschar (tan, brown or black) in the wound bed. (Active)  Wound Image   08/18/2018 12:43 PM  Dressing Type ABD;Barrier Film (skin prep);Moist to dry;Tape dressing  08/18/2018 12:43 PM  Dressing Changed 08/18/2018 12:43 PM  Dressing Change Frequency Daily 08/18/2018 12:43 PM  State of Healing Eschar 08/18/2018 12:43 PM  Site / Wound Assessment Black;Pink;Yellow 08/18/2018 12:43 PM  % Wound base Red or Granulating 50% 08/18/2018 12:43 PM  % Wound base Yellow/Fibrinous Exudate 50% 08/18/2018 12:43 PM  % Wound base Black/Eschar 0% 08/18/2018 12:43 PM  Peri-wound Assessment Intact 08/18/2018 12:43 PM  Wound Length (cm) 4 cm 08/18/2018 12:43 PM  Wound Width (cm) 4 cm 08/18/2018 12:43 PM  Wound Depth (cm) 4.3 cm 08/18/2018 12:43 PM  Wound Surface Area (cm^2) 16 cm^2 08/18/2018 12:43 PM  Wound Volume (cm^3) 68.8 cm^3 08/18/2018 12:43 PM  Tunneling (cm) 0 08/11/2018  1:36 PM  Undermining (cm) 2.5 cm at 11:00 08/11/2018  1:36 PM  Margins Unattached edges (unapproximated) 08/18/2018 12:43 PM  Drainage Amount Moderate 08/18/2018 12:43 PM  Drainage Description Serosanguineous 08/18/2018 12:43 PM  Treatment Cleansed;Debridement (Selective);Hydrotherapy (Pulse lavage);Packing (Saline gauze) 08/18/2018 12:43 PM   Santyl applied to wound bed prior to applying dressing.    Hydrotherapy Pulsed lavage therapy - wound location: Bilateral Ischial Tuberosities Pulsed Lavage with Suction (psi): 12 psi Pulsed Lavage with Suction - Normal Saline Used: 2000 mL Pulsed Lavage Tip: Tip with splash shield Selective Debridement Selective Debridement - Location: Bilateral Ischial Tuberosities Selective Debridement - Tools Used: Forceps;Scissors;Scalpel Selective Debridement - Tissue Removed: more yellow and browning non viable tissues   Wound Assessment and Plan  Wound Therapy - Assess/Plan/Recommendations Wound Therapy - Clinical Statement: Debridement progressing well. Wound staying clean without rectal pouch Wound Therapy - Functional Problem List: Paraplegia Factors Delaying/Impairing Wound Healing: Altered sensation;Incontinence;Immobility Hydrotherapy Plan: Debridement;Dressing  change;Patient/family education;Pulsatile lavage with suction Wound Therapy - Frequency: 6X / week Wound Therapy - Follow Up Recommendations: Other (comment)(PT recommending CIR) Wound Plan: See above  Wound Therapy Goals- Improve the function of patient's integumentary system by progressing the wound(s) through the phases of wound healing (inflammation - proliferation - remodeling) by: Decrease Necrotic Tissue to: 25% Decrease Necrotic Tissue - Progress: Progressing toward goal Increase Granulation Tissue to: 75% Increase Granulation Tissue - Progress: Progressing toward goal Improve Drainage Characteristics: Min;Serous Improve Drainage Characteristics - Progress: Progressing toward goal Goals/treatment plan/discharge plan were made with and agreed upon by patient/family: Yes Time For Goal Achievement: 7 days Wound Therapy - Potential for Goals: Good  Goals will be updated until maximal potential achieved or discharge criteria met.  Discharge criteria: when goals achieved, discharge from hospital, MD decision/surgical intervention, no progress towards goals, refusal/missing three consecutive treatments without notification or medical reason.  Tessie Fass Fedrick Cefalu 08/18/2018, 12:50 PM 08/18/2018  Donnella Sham, PT Acute Rehabilitation Services [phone]  (pager) [phone]  (office)

## 2018-08-19 ENCOUNTER — Inpatient Hospital Stay (HOSPITAL_COMMUNITY): Payer: Medicare Other | Admitting: Occupational Therapy

## 2018-08-19 ENCOUNTER — Inpatient Hospital Stay (HOSPITAL_COMMUNITY): Payer: Medicare Other | Admitting: Physical Therapy

## 2018-08-19 LAB — GLUCOSE, CAPILLARY: Glucose-Capillary: 151 mg/dL — ABNORMAL HIGH (ref 70–99)

## 2018-08-19 MED ORDER — SODIUM CHLORIDE 0.9 % IV SOLN
INTRAVENOUS | Status: DC
  Administered 2018-08-19: 20:00:00 via INTRAVENOUS

## 2018-08-19 NOTE — Progress Notes (Signed)
Pt wants to wait to take her suppository until after she eats her Wings ordered from [place].

## 2018-08-19 NOTE — Progress Notes (Signed)
Pt now requesting suppository that she refused earlier in the shift.  When returned to room pt sleeping again.  Will have it ready to give with 0600 meds and cath.

## 2018-08-19 NOTE — Care Management (Signed)
[facility] Individual Statement of Services  Patient Name:  Sarah [name]  Date:  08/19/2018  Welcome to the [facility].  Our goal is to provide you with an individualized program based on your diagnosis and situation, designed to meet your specific needs.  With this comprehensive rehabilitation program, you will be expected to participate in at least 3 hours of rehabilitation therapies Monday-Friday, with modified therapy programming on the weekends.  Your rehabilitation program will include the following services:  Physical Therapy (PT), Occupational Therapy (OT), 24 hour per day rehabilitation nursing, Therapeutic Recreaction (TR), Neuropsychology, Case Management (Social Worker), Rehabilitation Medicine, Nutrition Services and Pharmacy Services  Weekly team conferences will be held on Tuesdays to discuss your progress.  Your Social Worker will talk with you frequently to get your input and to update you on team discussions.  Team conferences with you and your family in attendance may also be held.  Expected length of stay: 12-14 days   Overall anticipated outcome: min/ moderate assistance  Depending on your progress and recovery, your program may change. Your Social Worker will coordinate services and will keep you informed of any changes. Your Social Worker's name and contact numbers are listed  below.  The following services may also be recommended but are not provided by the [facility] will be made to provide these services after discharge if needed.  Arrangements include referral to agencies that provide these services.  Your insurance has been verified to be:  [facility] Medicare Your primary doctor is:  [facility]  Pertinent information will be shared with your doctor and your insurance company.  Social Worker:   [place], [facility] or [phone]   Information discussed with and copy given to patient by: Lennart Pall, 08/19/2018, 3:47 PM

## 2018-08-19 NOTE — Progress Notes (Signed)
[facility] PHYSICAL MEDICINE & REHABILITATION PROGRESS NOTE   Subjective/Complaints: No new problems today. Still has tightness and pain in legs. Didn't receive suppository last night  ROS: Patient denies fever, rash, sore throat, blurred vision, nausea, vomiting, diarrhea, cough, shortness of breath or chest pain,   headache, or mood change.   Objective:   No results found. Recent Labs    08/17/18 0403 08/18/18 0512  WBC 4.5 3.9*  HGB 8.9* 9.3*  HCT 28.0* 28.7*  PLT 217 220   Recent Labs    08/17/18 0403 08/18/18 0512  NA 137 138  K 3.7 3.9  CL 103 100  CO2 24 22  GLUCOSE 70 61*  BUN 9 9  CREATININE 1.11* 1.21*  CALCIUM 8.1* 8.1*    Intake/Output Summary (Last 24 hours) at 08/19/2018 1203 Last data filed at 08/19/2018 1660 Gross per 24 hour  Intake 220 ml  Output 1000 ml  Net -780 ml     Physical Exam: Vital Signs Blood pressure 100/66, pulse 64, temperature 98.3 F (36.8 C), temperature source Oral, resp. rate 19, height 5\' 11"  (1.803 m), last menstrual period 10/22/2016, SpO2 100 %. Constitutional: No distress . Vital signs reviewed. HEENT: EOMI, oral membranes moist Neck: supple Cardiovascular: RRR without murmur. No JVD    Respiratory: CTA Bilaterally without wheezes or rales. Normal effort    GI: BS +, non-tender, non-distended  Musculoskeletal:  General: Edemapresent.  Neurological: She isalertand oriented to person, place, and time. Nocranial nerve deficit. [name] alert in no acute distress. Follows full commands. [place]. UE 4/5. LE: 1-2/5 prox to distal with extensor tone. DTR's 3+.resting tone 2/4 extensor in both legs, rigth more than left.  Decreased LT and pain in both legs. Leg slightly hypersensitive to touch. Skin: Skin iswarm. She isnot diaphoretic. Buttock wounds packed.  Psychiatric: She has a normal mood and affect. Herbehavior is normal.    Assessment/Plan: 1. Functional deficits secondary to  debility related to decubitus ulcers/UTI in setting of thoracic paraplegia which require 3+ hours per day of interdisciplinary therapy in a comprehensive inpatient rehab setting.  Physiatrist is providing close team supervision and 24 hour management of active medical problems listed below.  Physiatrist and rehab team continue to assess barriers to discharge/monitor patient progress toward functional and medical goals  Care Tool:  Bathing    Body parts bathed by patient: Right arm, Left arm, Chest, Abdomen, Face, Front perineal area, Right upper leg, Left upper leg   Body parts bathed by helper: Right lower leg, Left lower leg Body parts n/a: Buttocks(Sacral wounds with dressings)   Bathing assist Assist Level: Moderate Assistance - Patient 50 - 74%     Upper Body Dressing/Undressing Upper body dressing   What is the patient wearing?: Pull over shirt    Upper body assist Assist Level: Moderate Assistance - Patient 50 - 74%    Lower Body Dressing/Undressing Lower body dressing      What is the patient wearing?: Incontinence brief, Pants     Lower body assist Assist for lower body dressing: Total Assistance - Patient < 25%     Toileting Toileting    Toileting assist Assist for toileting: Dependent - Patient 0%     Transfers Chair/bed transfer  Transfers assist     Chair/bed transfer assist level: Dependent - mechanical lift     Locomotion Ambulation   Ambulation assist   Ambulation activity did not occur: N/A          Walk  10 feet activity   Assist  Walk 10 feet activity did not occur: N/A        Walk 50 feet activity   Assist Walk 50 feet with 2 turns activity did not occur: N/A         Walk 150 feet activity   Assist Walk 150 feet activity did not occur: N/A         Walk 10 feet on uneven surface  activity   Assist Walk 10 feet on uneven surfaces activity did not occur: N/A         Wheelchair     Assist Will  patient use wheelchair at discharge?: Yes Type of Wheelchair: Power    Wheelchair assist level: Supervision/Verbal cueing Max wheelchair distance: 150'    Wheelchair 50 feet with 2 turns activity    Assist        Assist Level: Supervision/Verbal cueing   Wheelchair 150 feet activity     Assist     Assist Level: Supervision/Verbal cueing    Medical Problem List and Plan: 1.Debility/paraplegiasecondary to sacral decubitus status post debridement with history of metastaticHurthlecancer followed by Dr.Kale --Continue CIR therapies including PT, OT  2. Antithrombotics: -DVT/anticoagulation:Chronic Xarelto for history of pulmonary emboli -antiplatelet therapy: N/A 3. Pain Management:Baclofen 20 mg 3 times daily, oxycodone as needed  -continue lyrica  for neuropathic pain in legs  -consider further baclofen titration as well 4. Mood:Zoloft 50 mg daily -antipsychotic agents: N/A 5. Neuropsych: This patientiscapable of making decisions on herown behalf. 6. Skin/Wound Care:Continuesantyl andhydrotherapy.   -eventual Vac placement when adequate fibronecrotic tissue removal 7. Fluids/Electrolytes/Nutrition: encourage PO  -BUN/Cr stable 8.Acute on chronic anemia. 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   Establish bowel program in evening  -I/O cath schedule, voiding trial  -Complete course of Rocephin 10. Hypothyroidism. Synthroid    LOS: 2 days A FACE TO [place] 08/19/2018, 12:03 PM

## 2018-08-19 NOTE — Progress Notes (Signed)
Social Work  Social Work Assessment and Plan  Patient Details  Name: Sarah [name] MRN: [id number] Date of Birth: [birth year]  Today's Date: 08/19/2018  Problem List:  Patient Active Problem List   Diagnosis Date Noted  . Febrile illness   . Bacterial skin infection 08/10/2018  . Eustachian tube disorder, left 06/30/2018  . Urinary tract infection 06/30/2018  . Malignant neoplasm ([place]) 06/01/2018  . Metastasis to brain ([place]) 06/01/2018  . Transaminitis 04/09/2018  . Hypoalbuminemia 04/09/2018  . Dyspnea   . Multifocal pneumonia   . Palliative care by specialist   . Goals of care, counseling/discussion   . DNR (do not resuscitate) discussion   . Fecal impaction ([place]) 02/06/2018  . HCAP (healthcare-associated pneumonia) 02/06/2018  . Anemia   . Chronic anticoagulation   . Sacral ulcer ([place]) 02/04/2018  . Fracture   . Acute lower UTI   . Myelopathy ([place]) 09/21/2017  . Muscle spasms of both lower extremities   . Closed fracture of shaft of left femur ([place])   . Acute blood loss anemia   . Hypothyroidism   . Nausea & vomiting 09/15/2017  . Closed fracture of left distal femur ([place]) 09/11/2017  . Paraplegia ([place]) 09/08/2017  . Obesity (BMI 30-39.9) 09/03/2017  . Status post surgery 09/03/2017  . Calculus of gallbladder without cholecystitis without obstruction   . Biliary dyskinesia   . Intractable nausea and vomiting 08/09/2017  . Thrombocytopenia ([place]) 08/09/2017  . Elevated bilirubin 08/09/2017  . AKI (acute kidney injury) ([place]) 08/09/2017  . Chest pain 01/26/2017  . Upper respiratory infection, viral 01/26/2017  . Counseling regarding advanced care planning and goals of care 01/02/2017  . Bilateral rotator cuff syndrome 02/12/2016  . E. coli UTI 08/01/2015  . Constipation due to neurogenic bowel 08/01/2015  . 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 Spastic paraparesis   . Neurogenic bladder   . History of pulmonary embolism   . Post-operative pain   . Metastatic cancer  ([place])   . Steroid-induced hyperglycemia   . Depression   . Obstipation   . Thoracic spine tumor 07/06/2015  . 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 Spinal cord compression due to malignant neoplasm metastatic to spine ([place])   . Postoperative hypothyroidism   . 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 Spasticity 09/19/2014  . Hypertension 05/01/2014  . Ingrown right big toenail 05/01/2014  . GERD (gastroesophageal reflux disease) 02/14/2014  . Dysphagia, pharyngoesophageal phase 02/06/2014  . Type 2 diabetes mellitus without complication ([place]) 78/93/8101  . Constipation   . Neurogenic bowel   . 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 Metastatic cancer to spine ([place]) 01/19/2014  . Rotator cuff tendonitis   . 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 Leg weakness, bilateral 12/13/2013   Past Medical History:  Past Medical History:  Diagnosis Date  . Anxiety   . Cancer [facility]    FNA of thyroid positive for onconytic/hurthle cell carcinoma  . Chronic back pain   . Complication of anesthesia    Nausea and vomiting  . DDD (degenerative disc disease), cervical   . Depression   . DJD (degenerative joint disease)   . Family history of adverse reaction to anesthesia    MOTHER HAS NAUSEA   . GERD (gastroesophageal reflux disease)   . History of blood transfusion    08/2017  . History of rectal fissure   . 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 Hypertension    05/27/2018- not current- runs low  . Hypothyroidism   . Obesity   . Paraplegia at T4 level ([place])   . Pneumonia 03/2018  . Pulmonary embolus, right ([place]) 2015  . Rotator cuff tendonitis right   Past Surgical History:  Past Surgical History:  Procedure Laterality Date  . APPLICATION OF CRANIAL NAVIGATION N/A 06/01/2018   Procedure: APPLICATION OF CRANIAL NAVIGATION;  Surgeon: Erline Levine, MD;   Location: [place];  Service: Neurosurgery;  Laterality: N/A;  . CHOLECYSTECTOMY N/A 01/25/2018   Procedure: LAPAROSCOPIC CHOLECYSTECTOMY;  Surgeon: Virl Cagey, MD;  Location: [facility];  Service: General;  Laterality: N/A;  . CRANIOTOMY N/A 06/01/2018   Procedure: Craniectomy for skull metastasis with Brainlab;  Surgeon: Erline Levine, MD;  Location: [place];  Service: Neurosurgery;  Laterality: N/A;  . FEMUR IM NAIL Left 09/11/2017   Procedure: INTRAMEDULLARY (IM) RETROGRADE FEMORAL NAILING;  Surgeon: Altamese [place], MD;  Location: [place];  Service: Orthopedics;  Laterality: Left;  . LAMINECTOMY N/A 12/14/2013   Procedure: THORACIC LAMINECTOMY FOR TUMOR THORACIC THREE;  Surgeon: Ashok Pall, MD;  Location: [place] [facility];  Service: Neurosurgery;  Laterality: N/A;  THORACIC LAMINECTOMY FOR TUMOR THORACIC THREE  . LAMINECTOMY N/A 07/05/2015   Procedure: THORACIC LAMINECTOMY FOR TUMOR;  Surgeon: Ashok Pall, MD;  Location: [place] [facility];  Service: Neurosurgery;  Laterality: N/A;  . LAMINECTOMY N/A 09/03/2017   Procedure: POSTERIOR SPINAL TUMOR RESECTION THORACIC THREE;  Surgeon: Ashok Pall, MD;  Location: [place];  Service: Neurosurgery;  Laterality: N/A;  POSTERIOR SPINAL TUMOR RESECTION THORACIC THREE  . POSTERIOR LUMBAR FUSION 4 LEVEL N/A 12/30/2013   Procedure: Thoracic one-Thoracic five posterior thoracic fusion with pedicle screws;  Surgeon: Ashok Pall, MD;  Location: [place] [facility];  Service: Neurosurgery;  Laterality: N/A;  Thoracic one-Thoracic five posterior thoracic fusion with pedicle screws  . THYROIDECTOMY N/A 01/09/2014   Procedure: TOTAL THYROIDECTOMY;  Surgeon: Izora Gala, MD;  Location: [place];  Service: ENT;  Laterality: N/A;  . TONSILLECTOMY     Social History:  reports that she has never smoked. She has never used smokeless tobacco. She reports that she does not drink alcohol or use drugs.  Family / Support Systems Marital Status: Single Patient Roles: Other  (Comment)(sister) Other Supports: sister, Magdalene [place] @ [phone]; mother, Torah Pinnock @ [phone] Anticipated Caregiver: pt's sister/brother in law Ability/Limitations of Caregiver: has been providing assist for slide board transfers since August of 2019 Caregiver Availability: Intermittent Family Dynamics: Pt notes her sister has continued to provide support and does "anything she can."    Social History Preferred language: English Religion: Baptist Cultural Background: NA Read: Yes Write: Yes Employment Status: Disabled Public relations account executive Issues: None Guardian/Conservator: None - per MD, pt is capable of making decisions on her on behalf.   Abuse/Neglect Abuse/Neglect Assessment Can Be Completed: Yes Physical Abuse: Denies Verbal Abuse: Denies Sexual Abuse: Denies Exploitation of patient/patient's resources: Denies Self-Neglect: Denies  Emotional Status Pt's affect, behavior and adjustment status: Pt lying in bed and very familiar to this SW from prior CIR hospitalizations.  She admits she is very frustrated with worsening wound and current situation.  She denies any significant emotional distress but will ask neuropsychology to see as he did follow during prior stay. Recent Psychosocial Issues: None Psychiatric History: None Substance Abuse History: None  Patient / Family Perceptions, Expectations & Goals Pt/Family understanding of illness & functional limitations: Pt and family with good  understanding of current medical issues and level of debility/ need for CIR again. Premorbid pt/family roles/activities: Sister has been providing primary caregiver support with assist of her spouse. Anticipated changes in roles/activities/participation: May need increased physical support. Pt/family expectations/goals: "I just want to be able to get as close to where I was as I can."  [employer]: None Premorbid Home Care/DME Agencies: Other  (Comment)([facility]) Transportation available at discharge: yes Resource referrals recommended: Neuropsychology  Discharge Planning Living Arrangements: Other relatives Support Systems: Other relatives Type of Residence: Private residence Insurance Resources: Medicare([employer] Medicare) Financial Resources: SSD Financial Screen Referred: No Living Expenses: Lives with family Money Management: Family Does the patient have any problems obtaining your medications?: No Home Management: mostly family Patient/Family Preliminary Plans: Pt to return home with sister and brother-in-law. Social Work Anticipated Follow Up Needs: HH/OP Expected length of stay: 12-14 days  Clinical Impression Returning CIR patient here for wound care and debility. Familiar with this SW.  She has good support from her sister and family who have been assisting her in their home.  Pt will need to reach a level that she is safe to be alone for a few hours each day.  Will have neuropsych follow up as they did during prior admissions.    Trenell Concannon 08/19/2018, 3:35 PM

## 2018-08-19 NOTE — Progress Notes (Signed)
Pt sleeping. 

## 2018-08-19 NOTE — Progress Notes (Signed)
Pt now refusing her suppository again stating that it takes awhile for it to work and she doesn't want to be in therapy when it works.

## 2018-08-19 NOTE — Progress Notes (Signed)
Physical Therapy Wound Treatment Patient Details  Name: Sarah [name] MRN: [id number] Date of Birth: [birth year]  Today's Date: 08/19/2018 PT Individual Time: 1020-1058 PT Individual Time Calculation (min): 38 min   Subjective  Subjective: Pleasant and agreeable to hydrotherapy Patient and Family Stated Goals: Heal wounds Date of Onset: (unknown) Prior Treatments: Bedside I&D  Pain Score: Pain Score: 3   Wound Assessment     Pressure Injury 08/10/18 Unstageable - Full thickness tissue loss in which the base of the ulcer is covered by slough (yellow, tan, gray, green or brown) and/or eschar (tan, brown or black) in the wound bed. (Active)  Wound Image   08/18/2018 12:43 PM  Dressing Type ABD;Barrier Film (skin prep);Moist to dry;Tape dressing 08/19/2018  1:02 PM  Dressing Changed 08/19/2018  1:02 PM  Dressing Change Frequency Daily 08/19/2018  1:02 PM  State of Healing Early/partial granulation 08/19/2018  1:02 PM  Site / Wound Assessment Black;Dusky;Granulation tissue;Yellow 08/19/2018  1:02 PM  % Wound base Red or Granulating 60% 08/19/2018  1:02 PM  % Wound base Yellow/Fibrinous Exudate 30% 08/19/2018  1:02 PM  % Wound base Black/Eschar 10% 08/19/2018  1:02 PM  Peri-wound Assessment Intact 08/19/2018  1:02 PM  Wound Length (cm) 9 cm 08/18/2018 12:43 PM  Wound Width (cm) 8 cm 08/18/2018 12:43 PM  Wound Depth (cm) 5.5 cm 08/18/2018 12:43 PM  Wound Surface Area (cm^2) 72 cm^2 08/18/2018 12:43 PM  Wound Volume (cm^3) 396 cm^3 08/18/2018 12:43 PM  Tunneling (cm) 0 08/11/2018  1:36 PM  Undermining (cm) 0 08/11/2018  1:36 PM  Margins Unattached edges (unapproximated) 08/19/2018  1:02 PM  Drainage Amount Minimal 08/19/2018  1:02 PM  Drainage Description Serosanguineous 08/19/2018  1:02 PM  Treatment Cleansed;Debridement (Selective);Hydrotherapy (Pulse lavage);Packing (Plain strip) 08/19/2018  1:02 PM     Pressure Injury 08/10/18 Unstageable - Full thickness tissue loss in which the base of the ulcer is  covered by slough (yellow, tan, gray, green or brown) and/or eschar (tan, brown or black) in the wound bed. (Active)  Wound Image   08/18/2018 12:43 PM  Dressing Type ABD;Barrier Film (skin prep);Moist to dry;Tape dressing 08/19/2018  1:02 PM  Dressing Changed 08/19/2018  1:02 PM  Dressing Change Frequency Daily 08/19/2018  1:02 PM  State of Healing Eschar 08/19/2018  1:02 PM  Site / Wound Assessment Black;Pink;Yellow 08/19/2018  1:02 PM  % Wound base Red or Granulating 60% 08/19/2018  1:02 PM  % Wound base Yellow/Fibrinous Exudate 40% 08/19/2018  1:02 PM  % Wound base Black/Eschar 0% 08/19/2018  1:02 PM  Peri-wound Assessment Intact 08/19/2018  1:02 PM  Wound Length (cm) 4 cm 08/18/2018 12:43 PM  Wound Width (cm) 4 cm 08/18/2018 12:43 PM  Wound Depth (cm) 4.3 cm 08/18/2018 12:43 PM  Wound Surface Area (cm^2) 16 cm^2 08/18/2018 12:43 PM  Wound Volume (cm^3) 68.8 cm^3 08/18/2018 12:43 PM  Tunneling (cm) 0 08/11/2018  1:36 PM  Undermining (cm) 2.5 cm at 11:00 08/11/2018  1:36 PM  Margins Unattached edges (unapproximated) 08/19/2018  1:02 PM  Drainage Amount Moderate 08/19/2018  1:02 PM  Drainage Description Serosanguineous 08/19/2018  1:02 PM  Treatment Cleansed;Debridement (Selective);Hydrotherapy (Pulse lavage);Packing (Saline gauze) 08/19/2018  1:02 PM      Hydrotherapy Pulsed lavage therapy - wound location: Bilateral Ischial Tuberosities Pulsed Lavage with Suction (psi): 12 psi Pulsed Lavage with Suction - Normal Saline Used: 2000 mL Pulsed Lavage Tip: Tip with splash shield Selective Debridement Selective Debridement - Location: Bilateral Ischial Tuberosities Selective Debridement -  Tools Used: Forceps;Scissors;Scalpel Selective Debridement - Tissue Removed: more yellow and browning non viable tissues   Wound Assessment and Plan  Wound Therapy - Assess/Plan/Recommendations Wound Therapy - Clinical Statement: Debridement progressing well. Wound staying clean without rectal pouch Wound Therapy -  Functional Problem List: Paraplegia Factors Delaying/Impairing Wound Healing: Altered sensation;Incontinence;Immobility Hydrotherapy Plan: Debridement;Dressing change;Patient/family education;Pulsatile lavage with suction Wound Therapy - Frequency: 6X / week Wound Therapy - Follow Up Recommendations: Other (comment)(PT recommending CIR) Wound Plan: See above  Wound Therapy Goals- Improve the function of patient's integumentary system by progressing the wound(s) through the phases of wound healing (inflammation - proliferation - remodeling) by: Decrease Necrotic Tissue to: 25% Decrease Necrotic Tissue - Progress: Progressing toward goal Increase Granulation Tissue to: 75% Increase Granulation Tissue - Progress: Progressing toward goal Improve Drainage Characteristics: Min;Serous Improve Drainage Characteristics - Progress: Progressing toward goal Goals/treatment plan/discharge plan were made with and agreed upon by patient/family: Yes Time For Goal Achievement: 7 days Wound Therapy - Potential for Goals: Good  Goals will be updated until maximal potential achieved or discharge criteria met.  Discharge criteria: when goals achieved, discharge from hospital, MD decision/surgical intervention, no progress towards goals, refusal/missing three consecutive treatments without notification or medical reason.  GP     Kenneth V Mottinger 08/19/2018, 1:05 PM  08/19/2018  Ken Mottinger, PT Acute Rehabilitation Services [phone]  (pager) [phone]  (office)  

## 2018-08-19 NOTE — Progress Notes (Signed)
Physical Therapy Session Note  Patient Details  Name: Sarah [name] MRN: [id number] Date of Birth: [birth year]  Today's Date: 08/19/2018 PT Individual Time: 1115-1230 PT Individual Time Calculation (min): 75 min   Short Term Goals: Week 1:  PT Short Term Goal 1 (Week 1): Pt will complete bed mobility with mod A PT Short Term Goal 2 (Week 1): Pt will complete least restrictive transfer with mod A PT Short Term Goal 3 (Week 1): Pt will demonstrate ability to perform effective pressure relief in w/c   Skilled Therapeutic Interventions/Progress Updates:    Pt received supine in bed, agreeable to PT session. No complaints of pain. Rolling L/R with min A and use of bedrails for dependent donning of pants. Supine to sit with max A with HOB elevated. Slide board transfer bed to w/c with mod-max A. Pt is able to brush her teeth and hair while seated at sink with setup A. Pt is able to change shirt while seated in wheelchair with setup A. Power w/c mobility x 150 ft with Supervision. Pt is able to set wheelchair up for slide board transfer to mat table with min cueing. Slide board transfer w/c to/from mat table with mod-max A, multimodal cueing for head/hips relationship. Seated balance EOM reaching outside BOS and across midline with alt UE with close SBA to CGA for balance. Pt is initially fearful of sitting without UE support but improves throughout session. Attempt to have pt bring ball from abdomen forwards outside BOS but pt unable to maintain sitting balance without forearm support on her thighs. Mini-sit-ups from therapy ball to sitting upright with min to mod A x 10 reps without use of UE support for core strengthening. Semi-reclined pec stretch on therapy ball x 5 min. Pt left seated in w/c in room setup for lunch with call button in reach at end of session.  Therapy Documentation Precautions:  Precautions Precautions: Fall Precaution Comments: stage 4 sacral wound Restrictions Weight Bearing  Restrictions: No    Therapy/Group: Individual Therapy   Excell Seltzer, PT, DPT  08/19/2018, 12:35 PM

## 2018-08-19 NOTE — Progress Notes (Addendum)
Notified by NT patient's SBP in low 80s per dinamap. Manual BP checked and consistent with automatic. Patient states "she feels more sleepy than normal" and endorses mild lightheadedness. Otherwise A/O X4, no pain, & no abdominal discomfort. Last straight cath at 1600 for 200 cc of urine. Bladder scan volume showed 123cc of urine. On call provider notified of hypotension and ordered normal saline to run at 50cc/hr and to recheck BP in a few hours.   2100: After reviewing patient's chart further, noticed a lower trend of patient's blood glucose. Due to patient's lethargy I performed a spot check CBG, 151.  0145: Patient's SBP continues to be in high 80s. Patient asymptomatic. Stated she has had issues with low BP in the past. IVF running. Wctm.   0630: Patient with 3 liquid stools overnight. Stool this AM red tinged. Notified PA of assessment, not concerned at this time. Fluids stopped and TED hose on.

## 2018-08-19 NOTE — Progress Notes (Signed)
Occupational Therapy Session Note  Patient Details  Name: Sarah [name] MRN: [id number] Date of Birth: 11-Feb-1966  Today's Date: 08/19/2018 OT Individual Time: 4098-1191 and 4782-9562 OT Individual Time Calculation (min): 60 min and 55 min   Short Term Goals: Week 1:  OT Short Term Goal 1 (Week 1): Pt will tolerate sitting EOB for 5 minutes with CGA in prep for functional task OT Short Term Goal 2 (Week 1): Pt will complete sliding board transfer EOB>w/c with mod A OT Short Term Goal 3 (Week 1): Pt will thread one LE into pants with max A using AE PRN from bed level  Skilled Therapeutic Interventions/Progress Updates:    Session One: Pt seen for OT ADL Bathing/dressing session. Pt asleep in supine upon arrival, with increased time, arousable and agreeable to tx session.  LB bathing/dressing completed from bed level. Education/demonstration provided regarding modified long circle sit position in order to reach LEs to bathe. She required mod A for positioning of R LE, max- total A for position of L LE due to increased tone. She was able to reach to wash to ankle on R LE and down to knee on L LE with assist to maintain position of LE. Lotion donned to B LEs in same manner. Pt able to assist with pulling pants upright once LEs were threaded into pants.  Upon rolling to pull pants up, noted that one of the sacral dressing was complettly saturated, packing coming out and wound visible. RN and charge nurse made aware. Nursing completed dressing care as unsafe to complete sliding board transfer with wound visible at this time.  Pt left in supine at end of session with RN present providing care. Education provided throughout session regarding modified ADLs and positioning, leg loops, decreased sensation and functional implications, and pt goals.   Session Two: Pt seen for OT session focusing on functional mobility and sitting balance/endurance in prep for functional tasks. Pt sitting up in power w/c  upon arrival, agreeable to tx session and denying pain. Pt reports she has been doing pressure relief in chair as instructed.  She managed power w/c throughout unit mod I.  Completed max A transfer to therapy mat via slide board when transferring to L, mod A when transferring back into chair going to R.  Pt sat unsupported EOM, initially requiring B UE support, progressing to close supervision with UE support with consistent cuing for anterior and L weightshift due to R lean bias. Pt tolerates ~2-3 minutes in supported sitting before requiring reclined rest break. Pt requires max-total A for anterior weightshift to return to unsupported sitting with VCs for use of head and momentum to shift weight.  While seated unsupported  EOM, pt completed peg board activity. Pt returned to power w/c and drove back to room. Pt requesting to stay up in w/c until 3:00 cath. Cont to educate regarding importance of pressure relief while up in chair, pt voiced understanding.   Therapy Documentation Precautions:  Precautions Precautions: Fall Precaution Comments: stage 4 sacral wound Restrictions Weight Bearing Restrictions: No Pain:   Therapy/Group: Individual Therapy  Sarah [name] L 08/19/2018, 6:55 AM

## 2018-08-20 ENCOUNTER — Inpatient Hospital Stay (HOSPITAL_COMMUNITY): Payer: Medicare Other | Admitting: Physical Therapy

## 2018-08-20 ENCOUNTER — Inpatient Hospital Stay (HOSPITAL_COMMUNITY): Payer: Medicare Other | Admitting: Occupational Therapy

## 2018-08-20 NOTE — Progress Notes (Signed)
Patient ID: Sarah [name], female   DOB: 05/10/1965, 53 y.o.   MRN: [id number]       Subjective: Patient with no new complaints.  Just had a BM and getting cleaned up.  Objective: Vital signs in last 24 hours: Temp:  [97.5 F (36.4 C)-97.6 F (36.4 C)] 97.5 F (36.4 C) (05/29 0539) Pulse Rate:  [65-73] 67 (05/29 0539) Resp:  [16-18] 18 (05/29 0539) BP: (84-89)/(48-62) 86/56 (05/29 0539) SpO2:  [99 %-100 %] 100 % (05/29 0539) Weight:  [85.7 kg] 85.7 kg (05/29 0539) Last BM Date: 08/17/18  Intake/Output from previous day: 05/28 0701 - 05/29 0700 In: 1086.5 [P.O.:660; I.V.:426.5] Out: 500 [Urine:500] Intake/Output this shift: Total I/O In: 120 [P.O.:120] Out: -   PE: Skin: both wounds are overall very clean.  Base of right wound now goes down to her bone.  Layer of periosteum is covering the bone.  Otherwise wound is clean.  Left small wound is clean as well.  Lab Results:  Recent Labs    08/18/18 0512  WBC 3.9*  HGB 9.3*  HCT 28.7*  PLT 220   BMET Recent Labs    08/18/18 0512  NA 138  K 3.9  CL 100  CO2 22  GLUCOSE 61*  BUN 9  CREATININE 1.21*  CALCIUM 8.1*   PT/INR No results for input(s): LABPROT, INR in the last 72 hours. CMP     Component Value Date/Time   NA 138 08/18/2018 0512   NA 139 04/08/2018 1503   NA 143 01/02/2017 1002   K 3.9 08/18/2018 0512   K 4.0 01/02/2017 1002   CL 100 08/18/2018 0512   CO2 22 08/18/2018 0512   CO2 25 01/02/2017 1002   GLUCOSE 61 (L) 08/18/2018 0512   GLUCOSE 87 01/02/2017 1002   BUN 9 08/18/2018 0512   BUN 13 04/08/2018 1503   BUN 11.9 01/02/2017 1002   CREATININE 1.21 (H) 08/18/2018 0512   CREATININE 1.09 (H) 07/21/2018 1004   CREATININE 0.9 01/02/2017 1002   CALCIUM 8.1 (L) 08/18/2018 0512   CALCIUM 7.2 (L) 09/15/2017 0635   CALCIUM 8.8 01/02/2017 1002   PROT 5.6 (L) 08/18/2018 0512   PROT 5.8 (L) 04/08/2018 1503   PROT 6.3 (L) 01/02/2017 1002   ALBUMIN 2.2 (L) 08/18/2018 0512   ALBUMIN 2.7 (L)  04/08/2018 1503   ALBUMIN 3.2 (L) 01/02/2017 1002   AST 15 08/18/2018 0512   AST 9 (L) 07/21/2018 1004   AST 24 01/02/2017 1002   ALT 10 08/18/2018 0512   ALT <6 07/21/2018 1004   ALT 12 01/02/2017 1002   ALKPHOS 373 (H) 08/18/2018 0512   ALKPHOS 126 01/02/2017 1002   BILITOT 1.1 08/18/2018 0512   BILITOT 0.5 07/21/2018 1004   BILITOT 1.06 01/02/2017 1002   GFRNONAA 51 (L) 08/18/2018 0512   GFRNONAA 58 (L) 07/21/2018 1004   GFRNONAA 71 06/07/2015 1523   GFRAA 60 (L) 08/18/2018 0512   GFRAA >60 07/21/2018 1004   GFRAA 82 06/07/2015 1523   Lipase     Component Value Date/Time   LIPASE 20 03/02/2018 1448       Studies/Results: No results found.  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 -patient will need wound care clinic follow up at discharge for further management of her wounds.  -no further surgical needs at this time.  We will sign off.  FEN -regular VTE -xarelto  ID -none currently   LOS: 3 days    Henreitta Cea , [facility] 08/20/2018, 10:04 AM Pager: [phone]

## 2018-08-20 NOTE — Progress Notes (Signed)
Patient still has low Bps, systolic in 77A. No s/s of distress. P.A. notified, no new orders received. Continue plan of care, encourage fluids.  Audie Clear, LPN

## 2018-08-20 NOTE — Progress Notes (Signed)
[facility] PHYSICAL MEDICINE & REHABILITATION PROGRESS NOTE   Subjective/Complaints: Low bp's especially yesterday. States she feels dizzy, head "full"  ROS: Patient denies fever, rash, sore throat, blurred vision, nausea, vomiting, diarrhea, cough, shortness of breath or chest pain,  headache, or mood change.   Objective:   No results found. Recent Labs    08/18/18 0512  WBC 3.9*  HGB 9.3*  HCT 28.7*  PLT 220   Recent Labs    08/18/18 0512  NA 138  K 3.9  CL 100  CO2 22  GLUCOSE 61*  BUN 9  CREATININE 1.21*  CALCIUM 8.1*    Intake/Output Summary (Last 24 hours) at 08/20/2018 0935 Last data filed at 08/20/2018 0734 Gross per 24 hour  Intake 1086.51 ml  Output 500 ml  Net 586.51 ml     Physical Exam: Vital Signs Blood pressure (!) 86/56, pulse 67, temperature (!) 97.5 F (36.4 C), temperature source Oral, resp. rate 18, height 5\' 11"  (1.803 m), weight 85.7 kg, last menstrual period 10/22/2016, SpO2 100 %. Constitutional: No distress . Vital signs reviewed. HEENT: EOMI, oral membranes moist Neck: supple Cardiovascular: RRR without murmur. No JVD    Respiratory: CTA Bilaterally without wheezes or rales. Normal effort    GI: BS +, non-tender, non-distended  Musculoskeletal:  General: Edemapresent.  Neurological: alert and oriented x 3. [name] alert in no acute distress. Follows full commands. [place]. UE 4/5. LE: 1-2/5 prox to distal with extensor tone. DTR's 3+.resting tone 2/4 extensor in both legs, rigth more than left.  Decreased LT and pain in both legs. Leg remains hypersensitive to touch. Skin: Skin iswarm. She isnot diaphoretic. Buttock wounds dressed Psychiatric: She has a normal mood and affect. Herbehavior is normal.    Assessment/Plan: 1. Functional deficits secondary to debility related to decubitus ulcers/UTI in setting of thoracic paraplegia which require 3+ hours per day of interdisciplinary therapy in a  comprehensive inpatient rehab setting.  Physiatrist is providing close team supervision and 24 hour management of active medical problems listed below.  Physiatrist and rehab team continue to assess barriers to discharge/monitor patient progress toward functional and medical goals  Care Tool:  Bathing    Body parts bathed by patient: Right arm, Left arm, Chest, Abdomen, Face, Front perineal area, Right upper leg, Left upper leg   Body parts bathed by helper: Right lower leg, Left lower leg Body parts n/a: Buttocks(Sacral wounds with dressings)   Bathing assist Assist Level: Moderate Assistance - Patient 50 - 74%     Upper Body Dressing/Undressing Upper body dressing   What is the patient wearing?: Pull over shirt    Upper body assist Assist Level: Moderate Assistance - Patient 50 - 74%    Lower Body Dressing/Undressing Lower body dressing      What is the patient wearing?: Incontinence brief     Lower body assist Assist for lower body dressing: Moderate Assistance - Patient 50 - 74%     Toileting Toileting    Toileting assist Assist for toileting: Dependent - Patient 0%     Transfers Chair/bed transfer  Transfers assist     Chair/bed transfer assist level: Maximal Assistance - Patient 25 - 49%     Locomotion Ambulation   Ambulation assist   Ambulation activity did not occur: N/A          Walk 10 feet activity   Assist  Walk 10 feet activity did not occur: N/A  Walk 50 feet activity   Assist Walk 50 feet with 2 turns activity did not occur: N/A         Walk 150 feet activity   Assist Walk 150 feet activity did not occur: N/A         Walk 10 feet on uneven surface  activity   Assist Walk 10 feet on uneven surfaces activity did not occur: N/A         Wheelchair     Assist Will patient use wheelchair at discharge?: Yes Type of Wheelchair: Power    Wheelchair assist level: Supervision/Verbal cueing Max  wheelchair distance: 150'    Wheelchair 50 feet with 2 turns activity    Assist        Assist Level: Supervision/Verbal cueing   Wheelchair 150 feet activity     Assist     Assist Level: Supervision/Verbal cueing    Medical Problem List and Plan: 1.Debility/paraplegiasecondary to sacral decubitus status post debridement with history of metastaticHurthlecancer followed by Dr.Kale --Continue CIR therapies including PT, OT  2. Antithrombotics: -DVT/anticoagulation:Chronic Xarelto for history of pulmonary emboli -antiplatelet therapy: N/A 3. Pain Management:Baclofen 20 mg 3 times daily, oxycodone as needed  -continue lyrica  for neuropathic pain in legs  -cant titrate baclofen any further d/t bp 4. Mood:Zoloft 50 mg daily -antipsychotic agents: N/A 5. Neuropsych: This patientiscapable of making decisions on herown behalf. 6. Skin/Wound Care:Continuesantyl andhydrotherapy per PT   -eventual Vac placement when adequate fibronecrotic tissue removal  -dc clindamycin 7. Fluids/Electrolytes/Nutrition: encourage PO  -BUN/Cr stable 5/27---recheck monday 8.Acute on chronic anemia. 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   Establish bowel program in evening, still not there yet  -I/O cath schedule, voiding trial  -Completed course of Rocephin 10. Hypothyroidism. Synthroid 11. Hypotension: more prominent since 5/26  -may be related to baclofen but tone is fairly significant  -will hold lyrica and observe  -check thyroid studies Monday  -encourage fluids  -consider florinef trial    LOS: 3 days A FACE TO [place] 08/20/2018, 9:35 AM

## 2018-08-20 NOTE — Progress Notes (Signed)
Physical Therapy Session Note  Patient Details  Name: Sarah [name] MRN: [id number] Date of Birth: 03-Feb-1966  Today's Date: 08/20/2018 PT Individual Time: 1330-1430 PT Individual Time Calculation (min): 60 min   Short Term Goals: Week 1:  PT Short Term Goal 1 (Week 1): Pt will complete bed mobility with mod A PT Short Term Goal 2 (Week 1): Pt will complete least restrictive transfer with mod A PT Short Term Goal 3 (Week 1): Pt will demonstrate ability to perform effective pressure relief in w/c   Skilled Therapeutic Interventions/Progress Updates:    Pt received semi-reclined in bed, agreeable to PT session. No complaints of pain. Session focus on BLE PROM in available planes of motion. Performed BLE PROM for improved mobility and contracture prevention. Provided handout for BLE PROM for pt to have caregivers follow upon d/c home. Pt agreeable to get up to w/c following PROM. When rolling pt to pull up pants discovered pt had incontinence of bowel. Pt is dependent for pericare and donning brief and new pants. Pt left semi-reclined in bed with needs in reach at end of session, unable to get up to w/c due to time constraints.  Therapy Documentation Precautions:  Precautions Precautions: Fall Precaution Comments: stage 4 sacral wound Restrictions Weight Bearing Restrictions: No    Therapy/Group: Individual Therapy   Excell Seltzer, PT, DPT  08/20/2018, 3:56 PM

## 2018-08-20 NOTE — Progress Notes (Signed)
Physical Therapy Wound Treatment  Pt's wound is now well debrided and ready for VAC dressing or management by nursing.  Hydrotherapy will sign off today.  Surgery and CIR MD aware.  Thank you.  Patient Details  Name: Sarah [name] MRN: [id number] Date of Birth: [birth year]  Today's Date: 08/20/2018 PT Individual Time: 1009-1053 PT Individual Time Calculation (min): 44 min   Subjective  Subjective: Pleasant and agreeable to hydrotherapy Patient and Family Stated Goals: Heal wounds Date of Onset: (unknown) Prior Treatments: Bedside I&D  Pain Score: Pain Score: 4   Wound Assessment     Pressure Injury 08/10/18 Unstageable - Full thickness tissue loss in which the base of the ulcer is covered by slough (yellow, tan, gray, green or brown) and/or eschar (tan, brown or black) in the wound bed. (Active)  Wound Image   08/18/2018 12:43 PM  Dressing Type ABD;Barrier Film (skin prep);Moist to dry;Tape dressing 08/20/2018 11:00 AM  Dressing Changed 08/20/2018 11:00 AM  Dressing Change Frequency Daily 08/20/2018 11:00 AM  State of Healing Early/partial granulation 08/20/2018 11:00 AM  Site / Wound Assessment Black;Dusky;Granulation tissue;Yellow 08/20/2018 11:00 AM  % Wound base Red or Granulating 85% 08/20/2018 11:00 AM  % Wound base Yellow/Fibrinous Exudate 10% 08/20/2018 11:00 AM  % Wound base Black/Eschar 10% 08/19/2018  1:02 PM  % Wound base Other/Granulation Tissue (Comment) 5% 08/20/2018 11:00 AM  Peri-wound Assessment Intact 08/20/2018 11:00 AM  Wound Length (cm) 9 cm 08/18/2018 12:43 PM  Wound Width (cm) 8 cm 08/18/2018 12:43 PM  Wound Depth (cm) 5.5 cm 08/18/2018 12:43 PM  Wound Surface Area (cm^2) 72 cm^2 08/18/2018 12:43 PM  Wound Volume (cm^3) 396 cm^3 08/18/2018 12:43 PM  Tunneling (cm) 0 08/11/2018  1:36 PM  Undermining (cm) 0 08/11/2018  1:36 PM  Margins Unattached edges (unapproximated) 08/20/2018 11:00 AM  Drainage Amount Minimal 08/20/2018 11:00 AM  Drainage Description Serous 08/20/2018  11:00 AM  Treatment Cleansed;Debridement (Selective);Hydrotherapy (Pulse lavage);Packing (Saline gauze) 08/20/2018 11:00 AM     Pressure Injury 08/10/18 Unstageable - Full thickness tissue loss in which the base of the ulcer is covered by slough (yellow, tan, gray, green or brown) and/or eschar (tan, brown or black) in the wound bed. (Active)  Wound Image   08/18/2018 12:43 PM  Dressing Type ABD;Barrier Film (skin prep);Moist to dry;Tape dressing 08/20/2018 11:00 AM  Dressing Changed 08/20/2018 11:00 AM  Dressing Change Frequency Daily 08/20/2018 11:00 AM  State of Healing Eschar 08/20/2018 11:00 AM  Site / Wound Assessment Black;Pink;Yellow 08/20/2018 11:00 AM  % Wound base Red or Granulating 60% 08/20/2018 11:00 AM  % Wound base Yellow/Fibrinous Exudate 10% 08/20/2018 11:00 AM  % Wound base Black/Eschar 0% 08/19/2018  1:02 PM  % Wound base Other/Granulation Tissue (Comment) 30% 08/20/2018 11:00 AM  Peri-wound Assessment Intact 08/20/2018 11:00 AM  Wound Length (cm) 4 cm 08/18/2018 12:43 PM  Wound Width (cm) 4 cm 08/18/2018 12:43 PM  Wound Depth (cm) 4.3 cm 08/18/2018 12:43 PM  Wound Surface Area (cm^2) 16 cm^2 08/18/2018 12:43 PM  Wound Volume (cm^3) 68.8 cm^3 08/18/2018 12:43 PM  Tunneling (cm) 0 08/11/2018  1:36 PM  Undermining (cm) 2.5 cm at 11:00 08/11/2018  1:36 PM  Margins Unattached edges (unapproximated) 08/20/2018 11:00 AM  Drainage Amount Moderate 08/20/2018 11:00 AM  Drainage Description Serous 08/20/2018 11:00 AM  Treatment Cleansed;Debridement (Selective);Hydrotherapy (Pulse lavage);Packing (Saline gauze) 08/20/2018 11:00 AM  Santyl applied to wound bed prior to applying dressing.    Hydrotherapy Pulsed lavage therapy - wound location: Bilateral  Ischial Tuberosities Pulsed Lavage with Suction (psi): 12 psi Pulsed Lavage with Suction - Normal Saline Used: 2000 mL Pulsed Lavage Tip: Tip with splash shield Selective Debridement Selective Debridement - Location: Bilateral Ischial Tuberosities  Selective Debridement - Tools Used: Forceps;Scissors;Scalpel Selective Debridement - Tissue Removed: more yellow and browning non viable tissues   Wound Assessment and Plan  Wound Therapy - Assess/Plan/Recommendations Wound Therapy - Clinical Statement: Debridement has now progress to where this wound is ready for a VAC if the surrounding surfaces will support adherence of the dressing.  Will sign off at this time due to most tissues are viable at this time. Wound Therapy - Functional Problem List: Paraplegia Factors Delaying/Impairing Wound Healing: Altered sensation;Incontinence;Immobility Hydrotherapy Plan: Debridement;Dressing change;Patient/family education;Pulsatile lavage with suction Wound Therapy - Frequency: 6X / week Wound Therapy - Follow Up Recommendations: Other (comment)(PT recommending CIR) Wound Plan: See above  Wound Therapy Goals- Improve the function of patient's integumentary system by progressing the wound(s) through the phases of wound healing (inflammation - proliferation - remodeling) by: Decrease Necrotic Tissue to: 25% Decrease Necrotic Tissue - Progress: Met Increase Granulation Tissue to: 75% Increase Granulation Tissue - Progress: Met Improve Drainage Characteristics: Min;Serous Improve Drainage Characteristics - Progress: Progressing toward goal Goals/treatment plan/discharge plan were made with and agreed upon by patient/family: Yes Time For Goal Achievement: 7 days Wound Therapy - Potential for Goals: Good  Goals will be updated until maximal potential achieved or discharge criteria met.  Discharge criteria: when goals achieved, discharge from hospital, MD decision/surgical intervention, no progress towards goals, refusal/missing three consecutive treatments without notification or medical reason.  GP     Tessie Fass Lucresha Dismuke 08/20/2018, 11:06 AM  08/20/2018  Donnella Sham, PT Acute Rehabilitation Services [phone]  (pager) [phone]   (office)

## 2018-08-20 NOTE — Consult Note (Addendum)
[facility] Nurse wound consult note Reason for Consult:Bilateral wounds to ischial tuberosities.  Has completed hydrotherapy course and wounds are clean and ready for topical therapy.  Concern for today is the loose stool that could potentially cause the loss of VAC seal.  Will apply NPWT (VAC) therapy today and Y connect the two so the wounds can be managed with one device.  Alternate orders that can be implemented if NPWT (VAC) seal is lost is NS moist kerlix dressing.  Change twice daily.  Wound type:Stage 4 pressure injuries Pressure Injury POA: Yes Measurement: Left ischial tuberosity:  4 cm x 6 cm x 4 cm  Right ischial tuberosity/extends to right posterior thigh:  11 cm x 7 cm x 6 cm  Wound bed: Pale pink nongranulating Skin: both wounds are overall very clean.  Base of right wound now goes down to her bone.  Layer of periosteum is covering the bone.  Otherwise wound is clean.  Left small wound is clean as well. Drainage (amount, consistency, odor) Moderate serosanguinous no odor.   Periwound:Rectum is 2 cm from right ischial wound. Dressing procedure/placement/frequency: Cleanse wounds to left and right ischial tuberosities with NS.  Fill wound depth with 1 piece black foam to each wound bed. Right ischial periwound is protected with barrier ring to promote seal.  Right ischial wound is bridged to trochanter to offload pressure. Two trac pads are Y connected.  Connected to NPWT (VAC) at 125 mHg   Change M/W/F at 0800 before patient is up and out of bed for therapy.   [facility] team will follow.  Domenic Moras MSN, RN, FNP-BC CWON Wound, Ostomy, Continence Nurse Pager [phone]

## 2018-08-20 NOTE — Progress Notes (Signed)
Occupational Therapy Session Note  Patient Details  Name: Sarah [name] MRN: [id number] Date of Birth: 01-12-1966  Today's Date: 08/20/2018 OT Individual Time: 1950-9326 and 1130-1230 OT Individual Time Calculation (min): 45 min  And 60 min and Today's Date: 08/20/2018 OT Missed Time: 15 Minutes Missed Time Reason: Nursing care   Short Term Goals: Week 1:  OT Short Term Goal 1 (Week 1): Pt will tolerate sitting EOB for 5 minutes with CGA in prep for functional task OT Short Term Goal 2 (Week 1): Pt will complete sliding board transfer EOB>w/c with mod A OT Short Term Goal 3 (Week 1): Pt will thread one LE into pants with max A using AE PRN from bed level  Skilled Therapeutic Interventions/Progress Updates:    Session One: Pt seen for OT session focusing on ADL re-training and bed mobility in prep for ADL tasks. Pt in supine upon arrival, agreeable to tx session and denying pain. Given therapy schedule and schedule for Hydrotherapy, opted to complete session at bed level as she would need to return to bed at end of session. Addressed LB dressing from bed level with pt managing LEs with min- mod A and VCs for technique. Hip flexor stretch performed to B sides in prep for functional task. Completed LB dressing from bed level using combination of modified circle and long sitting and using reacher to assist with threading LEs into pants. Pt able to recall hemi-dressing technique taught yesterday as L LE with much less functional movement compared to R. Mod A for managing LEs and min A for clothing management while pt threading pants over LEs. She rolled with min A using hospital bed functions and pt able to successfully pull pants up.  Pt late on cathing schedule, alerted nursing staff. Pt cathed during session. Education provided to pt regarding importance of maintaining cath schedule, self advocacy and OTs role in teaching self-cathing if that is a goal of pt's. Upon rolling to place brief, noted pt  cont to have loose stool and getting into sacral wwound dressing and wound. RN made aware and needing to complete immediate dressing change. Pt missed 15 minutes of skilled session due to need for dressing change.  Session Two: Pt seen for OT session focusing on functional mobility and ADL re-training. Pt sleeping soundly upon arrival, increased time to arouse, however, once awake pt agreeable to tx session and ready to get OOB. Pants and shoes donned total A for time management. She rolled with min A and able to pull pants up from side-lying position.  She transferred to sitting EOB with mod-max A using hospital bed functions. Cont to educate and encourage pt to direct caregivers with mobility and transfer techniques.  She completed max A sliding board transfer to power w/c. She completed grooming tasks from w/c level at sink. Worked on pt engaging abdominal muscles to lean forward in order to reach needed items. Worked on pt putting personal care items into drawers etc. Pt able to safely reach forward to place and move items in low drawers using power w/c functions to assist. RN alerted therapist of need to return pt back to bed at end of session as pt getting wound vac placed.  Completed mod assist sliding board transfer back to bed and mod A to return to supine. Pt left in supine at end of session, all needs in reach.   Therapy Documentation Precautions:  Precautions Precautions: Fall Precaution Comments: stage 4 sacral wound Restrictions Weight Bearing Restrictions: No Pain:  No/denies pain   Therapy/Group: Individual Therapy  Sarah [name] L 08/20/2018, 6:55 AM

## 2018-08-20 NOTE — IPOC Note (Signed)
Overall Plan of Care [facility]) Patient Details Name: THATIANA RENBARGER MRN: [id number] DOB: [birth year]  Admitting Diagnosis: <principal problem not specified>  Hospital Problems: Active Problems:   Paraplegia [facility])     Functional Problem List: Nursing Bowel, Bladder, Safety, Skin Integrity  PT Balance, Endurance, Motor, Safety, Sensory, Skin Integrity  OT Balance, Safety, Endurance, Sensory, Motor, Skin Integrity  SLP    TR         Basic ADL's: OT Grooming, Bathing, Dressing, Toileting     Advanced  ADL's: OT       Transfers: PT Bed Mobility, Bed to Chair, Car, Furniture, Floor  OT       Locomotion: PT Ambulation, Emergency planning/management officer, Stairs     Additional Impairments: OT None  SLP        TR      Anticipated Outcomes Item Anticipated Outcome  Self Feeding Indep  Swallowing      Basic self-care  Mod A  Toileting  Total A bed level   Bathroom Transfers n/a. will complete at bed level  Bowel/Bladder  manage bwel with min assist  Transfers  mod A  Locomotion  mod I once in power w/c  Communication     Cognition     Pain  pain level less that 4 on scale of 0-10  Safety/Judgment  pt able to demonstrate saftety precautions.    Therapy Plan: PT Intensity: Minimum of 1-2 x/day ,45 to 90 minutes PT Frequency: 5 out of 7 days PT Duration Estimated Length of Stay: 12-14 days OT Intensity: Minimum of 1-2 x/day, 45 to 90 minutes OT Frequency: 5 out of 7 days OT Duration/Estimated Length of Stay: ~2 weeks     Due to the current state of emergency, patients may not be receiving their 3-hours of Medicare-mandated therapy.   Team Interventions: Nursing Interventions Patient/Family Education, Bowel Management, Skin Care/Wound Management, Bladder Management, Disease Management/Prevention, Psychosocial Support, Pain Management  PT interventions Balance/vestibular training, Community reintegration, Discharge planning, Disease management/prevention, DME/adaptive  equipment instruction, Functional mobility training, Neuromuscular re-education, Pain management, Patient/family education, Psychosocial support, Skin care/wound management, Therapeutic Activities, Therapeutic Exercise, UE/LE Strength taining/ROM, UE/LE Coordination activities, Wheelchair propulsion/positioning  OT Interventions Balance/vestibular training, Discharge planning, DME/adaptive equipment instruction, Functional mobility training, Psychosocial support, Skin care/wound managment, Therapeutic Activities, UE/LE Strength taining/ROM, Wheelchair propulsion/positioning, UE/LE Coordination activities, Therapeutic Exercise, Self Care/advanced ADL retraining, Patient/family education, Neuromuscular re-education, Splinting/orthotics, Disease mangement/prevention  SLP Interventions    TR Interventions    SW/CM Interventions Discharge Planning, Psychosocial Support, Patient/Family Education   Barriers to Discharge MD  Medical stability and Wound care  Nursing      PT Medical stability, Incontinence, Neurogenic Bowel & Bladder, Wound Care    OT Wound Care    SLP      SW       Team Discharge Planning: Destination: PT-Home ,OT- Home , SLP-  Projected Follow-up: PT-Home health PT, OT-  Home health OT, SLP-  Projected Equipment Needs: PT-(pt already owns all necessary equipment), OT- None recommended by OT, SLP-  Equipment Details: PT- , OT-Pt has all needed DME Patient/family involved in discharge planning: PT- Patient,  OT-Patient, SLP-   MD ELOS: 12-14 days Medical Rehab Prognosis:  Excellent Assessment: The patient has been admitted for CIR therapies with the diagnosis of thoracic sci, debility, stage 4 wounds. The team will be addressing functional mobility, strength, stamina, balance, safety, adaptive techniques and equipment, self-care, bowel and bladder mgt, patient and caregiver education, NMR, wound care, equipment evaluation. Goals  have been set at min to mod assist with self-care,  mod assist with transfers and mod I with w/c control.   Due to the current state of emergency, patients may not be receiving their 3 hours per day of Medicare-mandated therapy.    [name], MD, FAAPMR      See Team Conference Notes for weekly updates to the plan of care

## 2018-08-21 ENCOUNTER — Inpatient Hospital Stay (HOSPITAL_COMMUNITY): Payer: Medicare Other | Admitting: Occupational Therapy

## 2018-08-21 ENCOUNTER — Inpatient Hospital Stay (HOSPITAL_COMMUNITY): Payer: Medicare Other | Admitting: Physical Therapy

## 2018-08-21 DIAGNOSIS — N183 Chronic kidney disease, stage 3 unspecified: Secondary | ICD-10-CM

## 2018-08-21 DIAGNOSIS — R0981 Nasal congestion: Secondary | ICD-10-CM

## 2018-08-21 DIAGNOSIS — I959 Hypotension, unspecified: Secondary | ICD-10-CM

## 2018-08-21 DIAGNOSIS — R739 Hyperglycemia, unspecified: Secondary | ICD-10-CM

## 2018-08-21 DIAGNOSIS — E46 Unspecified protein-calorie malnutrition: Secondary | ICD-10-CM

## 2018-08-21 DIAGNOSIS — D649 Anemia, unspecified: Secondary | ICD-10-CM

## 2018-08-21 MED ORDER — GUAIFENESIN ER 600 MG PO TB12
600.0000 mg | ORAL_TABLET | Freq: Two times a day (BID) | ORAL | Status: DC
  Administered 2018-08-21 (×2): 600 mg via ORAL
  Filled 2018-08-21: qty 1

## 2018-08-21 MED ORDER — PRO-STAT SUGAR FREE PO LIQD
30.0000 mL | Freq: Two times a day (BID) | ORAL | Status: DC
  Administered 2018-08-21 – 2018-08-24 (×8): 30 mL via ORAL
  Filled 2018-08-21 (×11): qty 30

## 2018-08-21 NOTE — Progress Notes (Signed)
Occupational Therapy Session Note  Patient Details  Name: Sarah [name] MRN: [id number] Date of Birth: 10-Oct-1965  Today's Date: 08/21/2018 OT Individual Time: 0909-1003 OT Individual Time Calculation (min): 54 min    Short Term Goals: Week 1:  OT Short Term Goal 1 (Week 1): Pt will tolerate sitting EOB for 5 minutes with CGA in prep for functional task OT Short Term Goal 2 (Week 1): Pt will complete sliding board transfer EOB>w/c with mod A OT Short Term Goal 3 (Week 1): Pt will thread one LE into pants with max A using AE PRN from bed level  Skilled Therapeutic Interventions/Progress Updates:    Pt completed bathing and dressing in supine rolling to supine with HOB elevated.  Supervision for doffing shirt and completing UB bathing with HOB elevated.  She was able to complete washing front peri area and upper legs from this position as well with setup.   She needed mod assist for washing the lower legs, with therapist having to assist with crossing one over the other and bringing them up.  Noted increased tightness in the right hip compared to the left.  She was able to complete rolling side to side with min assist for therapist to wash buttocks and donn new brief with total assist.  Total assist also needed for donning pants over feet and rolling to pull over her hips.  She needed mod assist for pulling down pullover tank top and shirt in supine rolling as well.  Finished session in bed with call button and phone in reach and four bed rails up.   Therapy Documentation Precautions:  Precautions Precautions: Fall Precaution Comments: stage 4 sacral wound Restrictions Weight Bearing Restrictions: No  Pain: Pain Assessment Pain Scale: 0-10 Pain Score: 4  Faces Pain Scale: Hurts a little bit Pain Type: Acute pain Pain Location: Back Pain Orientation: Mid Pain Descriptors / Indicators: Discomfort Pain Onset: With Activity Pain Intervention(s): Repositioned ADL: See Care Tool Section  for some details of ADL  Therapy/Group: Individual Therapy  Sahaj Bona OTR/L 08/21/2018, 12:15 PM

## 2018-08-21 NOTE — Progress Notes (Signed)
Occupational Therapy Session Note  Patient Details  Name: Sarah [name] MRN: [id number] Date of Birth: [birth year]  Today's Date: 08/21/2018 OT Individual Time: 8676-1950 OT Individual Time Calculation (min): 62 min  Short Term Goals: Week 1:  OT Short Term Goal 1 (Week 1): Pt will tolerate sitting EOB for 5 minutes with CGA in prep for functional task OT Short Term Goal 2 (Week 1): Pt will complete sliding board transfer EOB>w/c with mod A OT Short Term Goal 3 (Week 1): Pt will thread one LE into pants with max A using AE PRN from bed level  Skilled Therapeutic Interventions/Progress Updates:    Pt greeted in bed after PT handoff. Amenable to tx, wanting to start session by brushing her teeth. Pt was set up for oral care in bed. She used B UEs to pull herself forward using bedrails to complete this task in long sitting. Mod A for semi reclined<long sitting, and Max A for preventing posterior LOB without UE support. After brief rest, pt engaged in modified yoga in long sitting and supine positions to work on core strength and general flexibility needed for self care. Min-Mod A for balance with unilateral support on bedrail while engaging in gentle back and cervical stretches. When she transitioned to supine, OT set pt up in reclined bound angle pose to work towards functional circle sitting. Pt is very tight in hips bilaterally which limits her ability to circle sit. Modified spinal twists completed bedlevel also with Max A for LE mgt and positioning. Pt maintained each stretch position for 10 deep breaths to promote relaxation and tone reduction. At end of session pt boosted herself up in bed when it was placed in trendelenburg position. She was left in care of NT at session exit.   Therapy Documentation Precautions:  Precautions Precautions: Fall Precaution Comments: stage 4 sacral wound Restrictions Weight Bearing Restrictions: No Vital Signs: Therapy Vitals Temp: 97.6 F (36.4 C) Temp  Source: Oral Pulse Rate: 60 Resp: 19 BP: 95/60 Patient Position (if appropriate): Lying Oxygen Therapy SpO2: 100 % O2 Device: Room Air Pain: No s/s pain during tx   ADL:       Therapy/Group: Individual Therapy  Zakhari Fogel A Etai Copado 08/21/2018, 3:53 PM

## 2018-08-21 NOTE — Progress Notes (Signed)
Physical Therapy Session Note  Patient Details  Name: Sarah [name] MRN: [id number] Date of Birth: 11-02-1965  Today's Date: 08/21/2018 PT Individual Time: 1330-1430 PT Individual Time Calculation (min): 60 min   Short Term Goals: Week 1:  PT Short Term Goal 1 (Week 1): Pt will complete bed mobility with mod A PT Short Term Goal 2 (Week 1): Pt will complete least restrictive transfer with mod A PT Short Term Goal 3 (Week 1): Pt will demonstrate ability to perform effective pressure relief in w/c   Skilled Therapeutic Interventions/Progress Updates:      Therapy Documentation Precautions:  Precautions Precautions: Fall Precaution Comments: stage 4 sacral wound Restrictions Weight Bearing Restrictions: No  Treatment: Pt in bed on arrival and agreeable to PT at this time. Does report feeling "fuzzy headed" today. BP check with reading of 79/56.   Performed passive stretching to bil LE's as follows: heel cords, hamstrings, hip abductors, hip adductors for prolonged holds 3-4 reps each. Performed passive hip/knee flexion/extension x 10-12 reps each leg. With LE in knee flexion with foot on bed worked on hip ER/IR for 10 reps each side with stretch holds at end range with each rep. BP after ex's 76/52. With HOB 45 degrees pt used bottom bed rail on right and PTA hand hold assist to elevate trunk of bed surface for long sitting in bed for 3 reps. With each rep went into prolong trunk stretching in this position. Then with 1st rep also worked on upright posture with decreased UE support to address sitting balance. With 2cd rep worked on lateral rocking after stretching to further address weight shifting/off loading of buttocks for pressure relief. On 3rd rep after stretching pt performed scapular retractions for 10 reps, then alternating UE raises for 5 reps, progressing into lateral elbow "taps" (pt actually took elbows past rails) for 10 reps each side. Attempted modified circle sitting (right  leg in only as left too tight) with pt able to hold for about 1-2 minutes. BP checked afterward with reading of 84/56.  Pt left in bed with HOB 35 degrees, bed alarm on and all needs in reach.     Therapy/Group: Individual Therapy  Willow Ora 08/21/2018, 7:57 AM

## 2018-08-21 NOTE — Progress Notes (Signed)
[facility] PHYSICAL MEDICINE & REHABILITATION PROGRESS NOTE   Subjective/Complaints: Patient seen sitting up in bed this morning.  She states she slept well overnight.  She has questions regarding blood pressure, encourage fluids.  She complains of feeling "stuffy"  ROS: + "stuffy head".  Denies CP, shortness of breath, nausea, vomiting, diarrhea.  Objective:   No results found. No results for input(s): WBC, HGB, HCT, PLT in the last 72 hours. No results for input(s): NA, K, CL, CO2, GLUCOSE, BUN, CREATININE, CALCIUM in the last 72 hours.  Intake/Output Summary (Last 24 hours) at 08/21/2018 1013 Last data filed at 08/20/2018 2232 Gross per 24 hour  Intake 360 ml  Output 300 ml  Net 60 ml     Physical Exam: Vital Signs Blood pressure (!) 84/60, pulse 63, temperature 97.6 F (36.4 C), temperature source Oral, resp. rate 18, height 5\' 11"  (1.803 m), weight 85.7 kg, last menstrual period 10/22/2016, SpO2 100 %. Constitutional: No distress . Vital signs reviewed. HENT: Normocephalic.  Atraumatic. Eyes: EOMI. No discharge. Cardiovascular: No JVD. Respiratory: Normal effort. GI: Non-distended. Musc: Lower extremity edema Neurological: Alert and oriented Follows full commands.  Motor: Bilateral upper extremities: Grossly 4/5 proximal to distal.  Bilateral lower extremities: 0/5 proximal to distal  Sensation absent to light touch Skin: Skin iswarm. She isnot diaphoretic. Buttock wounds dressed, not examined today Psychiatric: She has a normal mood and affect. Herbehavior is normal.    Assessment/Plan: 1. Functional deficits secondary to debility related to decubitus ulcers/UTI in setting of thoracic paraplegia which require 3+ hours per day of interdisciplinary therapy in a comprehensive inpatient rehab setting.  Physiatrist is providing close team supervision and 24 hour management of active medical problems listed below.  Physiatrist and rehab team continue to assess  barriers to discharge/monitor patient progress toward functional and medical goals  Care Tool:  Bathing    Body parts bathed by patient: Right arm, Left arm, Chest, Abdomen, Face, Front perineal area, Right upper leg, Left upper leg   Body parts bathed by helper: Right lower leg, Left lower leg Body parts n/a: Buttocks(Sacral wounds with dressings)   Bathing assist Assist Level: Moderate Assistance - Patient 50 - 74%     Upper Body Dressing/Undressing Upper body dressing   What is the patient wearing?: Pull over shirt    Upper body assist Assist Level: Total Assistance - Patient < 25%    Lower Body Dressing/Undressing Lower body dressing      What is the patient wearing?: Incontinence brief     Lower body assist Assist for lower body dressing: Maximal Assistance - Patient 25 - 49%     Toileting Toileting    Toileting assist Assist for toileting: Dependent - Patient 0%     Transfers Chair/bed transfer  Transfers assist     Chair/bed transfer assist level: Maximal Assistance - Patient 25 - 49%     Locomotion Ambulation   Ambulation assist   Ambulation activity did not occur: N/A          Walk 10 feet activity   Assist  Walk 10 feet activity did not occur: N/A        Walk 50 feet activity   Assist Walk 50 feet with 2 turns activity did not occur: N/A         Walk 150 feet activity   Assist Walk 150 feet activity did not occur: N/A         Walk 10 feet on uneven surface  activity  Assist Walk 10 feet on uneven surfaces activity did not occur: N/A         Wheelchair     Assist Will patient use wheelchair at discharge?: Yes Type of Wheelchair: Power    Wheelchair assist level: Supervision/Verbal cueing Max wheelchair distance: 150'    Wheelchair 50 feet with 2 turns activity    Assist        Assist Level: Supervision/Verbal cueing   Wheelchair 150 feet activity     Assist     Assist Level:  Supervision/Verbal cueing    Medical Problem List and Plan: 1.Debility/paraplegiasecondary to sacral decubitus status post debridement with 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 Antithrombotics: -DVT/anticoagulation:Chronic Xarelto for history of pulmonary emboli -antiplatelet therapy: N/A 3. Pain Management:Baclofen 20 mg 3 times daily, oxycodone as needed  -Lyrica on hold due to blood pressure  -cant titrate baclofen any further d/t bp 4. Mood:Zoloft 50 mg daily -antipsychotic agents: N/A 5. Neuropsych: This patientiscapable of making decisions on herown behalf. 6. Skin/Wound Care:Continuesantyl andhydrotherapy per PT   -eventual Vac placement when adequate fibronecrotic tissue removal  -dced clindamycin 7. Fluids/Electrolytes/Nutrition: encourage PO 8.Acute on chronic anemia.   Hemoglobin 9.2 on 5/27, labs ordered for Monday  -fe++ supp 9.Neurogenic bowel and bladderas well as Klebsiella UTI.   Establish bowel program in evening  -I/O cath schedule, voiding trial  -Completed course of Rocephin 10. Hypothyroidism. Synthroid 11. Hypotension:   Stronger autonomic component  -may be related to baclofen but tone is fairly significant  -will hold lyrica and observe  -check thyroid studies Monday  -encourage fluids, emphasized again  -consider florinef trial  -Will consider fluid bolus if symptomatic 12.  Congestion  Mucinex ordered on 5/30 13.  Hyperglycemia  Elevated on 5/28, continue to monitor 14.  Hypoalbuminemia  Supplement initiated on 5/30. 15.  CKD stage III  Creatinine 1.21 on 5/27, labs ordered for Monday  LOS: 4 days A FACE TO FACE EVALUATION WAS PERFORMED  Sarah [name] Sarah [name] 08/21/2018, 10:13 AM

## 2018-08-22 ENCOUNTER — Inpatient Hospital Stay (HOSPITAL_COMMUNITY): Payer: Medicare Other

## 2018-08-22 DIAGNOSIS — I9589 Other hypotension: Secondary | ICD-10-CM

## 2018-08-22 MED ORDER — GUAIFENESIN 100 MG/5ML PO SOLN: 15.0000 mL | ORAL | Status: DC | PRN

## 2018-08-22 MED ORDER — SODIUM CHLORIDE 0.9 % IV BOLUS
500.0000 mL | Freq: Once | INTRAVENOUS | Status: AC
  Administered 2018-08-22: 500 mL via INTRAVENOUS

## 2018-08-22 NOTE — Progress Notes (Signed)
Occupational Therapy Session Note  Patient Details  Name: Sarah [name] MRN: [id number] Date of Birth: 22-Oct-1965  Today's Date: 08/22/2018 OT Individual Time: 1100-1210 OT Individual Time Calculation (min): 70 min    Short Term Goals: Week 1:  OT Short Term Goal 1 (Week 1): Pt will tolerate sitting EOB for 5 minutes with CGA in prep for functional task OT Short Term Goal 2 (Week 1): Pt will complete sliding board transfer EOB>w/c with mod A OT Short Term Goal 3 (Week 1): Pt will thread one LE into pants with max A using AE PRN from bed level  Skilled Therapeutic Interventions/Progress Updates:    Pt received supine in bed with RN present administering medication. No c/o pain. Assisted in coaching pt through swallowing and positioning strategies to take medication. Pt set up to complete bathing bed level. Pt able to bath and dress UB with CGA for long sitting when pulling down shirt posteriorly. Pt used B rails to pull self into sitting. Attempted to bring each leg into circle sitting for pt to wash but B legs kept experiencing extensor tone. Mod A overall to wash LB. Pt able to roll R and L with CGA for donning of pants and brief change. BM residue on brief. Pants were donned total A. Pt transitioned to EOB with max +1 assist. Pt completed slideboard transfer into power chair with max +2 assist, with pt directing chair set up. Pt completed oral care at sink with set up assist. Wound vac lines checked and plugged in. Pt left sitting up with all needs within reach.    Therapy Documentation Precautions:  Precautions Precautions: Fall Precaution Comments: stage 4 sacral wound Restrictions Weight Bearing Restrictions: No   Therapy/Group: Individual Therapy  Curtis Sites 08/22/2018, 7:19 AM

## 2018-08-22 NOTE — Progress Notes (Signed)
Rested good thus far. At 0100, no void since last cath at 1600. Bladder scan=118, I & O cath=75cc's. Incontinent loose stool.  Encouraged PO intake. Drank 240'cc's at bedtime. Wound VAC to 2 places on sacrum. Foam dressing to right heel. Small area to left heel. Bilateal prevalone boots applied. Patrici Ranks A

## 2018-08-22 NOTE — Progress Notes (Signed)
[facility] PHYSICAL MEDICINE & REHABILITATION PROGRESS NOTE   Subjective/Complaints: Patient seen laying in bed this morning.  She was difficult to arouse.  She states she slept well overnight.  Discussed with nursing-patient recently took oxycodone.  Patient noted to have low blood pressure, discussed with nursing.  Encourage p.o. intake from patient.  Patient also unable to swallow Mucinex due to size.  ROS: Denies CP, shortness of breath, nausea, vomiting, diarrhea.  Objective:   No results found. No results for input(s): WBC, HGB, HCT, PLT in the last 72 hours. No results for input(s): NA, K, CL, CO2, GLUCOSE, BUN, CREATININE, CALCIUM in the last 72 hours.  Intake/Output Summary (Last 24 hours) at 08/22/2018 0921 Last data filed at 08/22/2018 0100 Gross per 24 hour  Intake 240 ml  Output 375 ml  Net -135 ml     Physical Exam: Vital Signs Blood pressure (!) 83/53, pulse (!) 56, temperature 98.2 F (36.8 C), temperature source Oral, resp. rate 18, height 5\' 11"  (1.803 m), weight 89.8 kg, last menstrual period 10/22/2016, SpO2 100 %. Constitutional: No distress . Vital signs reviewed. HENT: Normocephalic.  Atraumatic. Eyes: EOMI.  No discharge. Cardiovascular: No JVD. Respiratory: Normal effort. GI: Non-distended. Musc: Lower extremity edema Neurological: Somnolent Follows full commands.  Motor: Bilateral upper extremities: Grossly 4/5 proximal to distal, stable.  Bilateral lower extremities: 0/5 proximal to distal, stable Sensation absent to light touch Skin: Skin iswarm. She isnot diaphoretic. Buttock wounds dressed, not examined today Psychiatric: She has a normal mood and affect. Herbehavior is normal.    Assessment/Plan: 1. Functional deficits secondary to debility related to decubitus ulcers/UTI in setting of thoracic paraplegia which require 3+ hours per day of interdisciplinary therapy in a comprehensive inpatient rehab setting.  Physiatrist is providing  close team supervision and 24 hour management of active medical problems listed below.  Physiatrist and rehab team continue to assess barriers to discharge/monitor patient progress toward functional and medical goals  Care Tool:  Bathing    Body parts bathed by patient: Right arm, Left arm, Chest, Abdomen, Face, Front perineal area, Right upper leg, Left upper leg   Body parts bathed by helper: Right lower leg, Left lower leg, Buttocks Body parts n/a: Buttocks(Sacral wounds with dressings)   Bathing assist Assist Level: Moderate Assistance - Patient 50 - 74%     Upper Body Dressing/Undressing Upper body dressing   What is the patient wearing?: Pull over shirt    Upper body assist Assist Level: Moderate Assistance - Patient 50 - 74%    Lower Body Dressing/Undressing Lower body dressing      What is the patient wearing?: Incontinence brief, Pants     Lower body assist Assist for lower body dressing: Total Assistance - Patient < 25%     Toileting Toileting    Toileting assist Assist for toileting: Dependent - Patient 0%     Transfers Chair/bed transfer  Transfers assist     Chair/bed transfer assist level: Maximal Assistance - Patient 25 - 49%     Locomotion Ambulation   Ambulation assist   Ambulation activity did not occur: N/A          Walk 10 feet activity   Assist  Walk 10 feet activity did not occur: N/A        Walk 50 feet activity   Assist Walk 50 feet with 2 turns activity did not occur: N/A         Walk 150 feet activity   Assist Walk  150 feet activity did not occur: N/A         Walk 10 feet on uneven surface  activity   Assist Walk 10 feet on uneven surfaces activity did not occur: N/A         Wheelchair     Assist Will patient use wheelchair at discharge?: Yes Type of Wheelchair: Power    Wheelchair assist level: Supervision/Verbal cueing Max wheelchair distance: 150'    Wheelchair 50 feet with 2  turns activity    Assist        Assist Level: Supervision/Verbal cueing   Wheelchair 150 feet activity     Assist     Assist Level: Supervision/Verbal cueing    Medical Problem List and Plan: 1.Debility/paraplegiasecondary to sacral decubitus status post debridement with 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 Antithrombotics: -DVT/anticoagulation:Chronic Xarelto for history of pulmonary emboli -antiplatelet therapy: N/A 3. Pain Management:Baclofen 20 mg 3 times daily, oxycodone as needed  -Lyrica on hold due to blood pressure  -cant titrate baclofen any further d/t bp 4. Mood:Zoloft 50 mg daily -antipsychotic agents: N/A 5. Neuropsych: This patientiscapable of making decisions on herown behalf. 6. Skin/Wound Care:Continuesantyl andhydrotherapy per PT   -eventual Vac placement when adequate fibronecrotic tissue removal  -dced clindamycin 7. Fluids/Electrolytes/Nutrition: encourage PO 8.Acute on chronic anemia.   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   Establish bowel program in evening  -I/O cath schedule, voiding trial  -Completed course of Rocephin 10. Hypothyroidism. Synthroid 11. Hypotension:   Strong autonomic component  -may be related to baclofen but tone is fairly significant  -will hold lyrica and observe  -check thyroid studies Monday  -encourage fluids, emphasized again today  -consider florinef trial  Echo reviewed from 2019 EF of 55 to 60%, fluid bolus ordered 12.  Congestion  Mucinex ordered on 5/30, changed to liquid on 5/31 13.  Hyperglycemia  Elevated on 5/28, continue to monitor 14.  Hypoalbuminemia  Supplement initiated on 5/30. 15.  CKD stage III  Creatinine 1.21 on 5/27, labs ordered for tomorrow  LOS: 5 days A FACE TO FACE EVALUATION WAS PERFORMED    Lorie Phenix 08/22/2018, 9:21 AM

## 2018-08-23 ENCOUNTER — Inpatient Hospital Stay (HOSPITAL_COMMUNITY): Payer: Medicare Other | Admitting: Physical Therapy

## 2018-08-23 ENCOUNTER — Inpatient Hospital Stay (HOSPITAL_COMMUNITY): Payer: Medicare Other | Admitting: Occupational Therapy

## 2018-08-23 LAB — CBC WITH DIFFERENTIAL/PLATELET
Abs Immature Granulocytes: 0.06 10*3/uL (ref 0.00–0.07)
Basophils Absolute: 0 10*3/uL (ref 0.0–0.1)
Basophils Relative: 0 %
Eosinophils Absolute: 0.1 10*3/uL (ref 0.0–0.5)
Eosinophils Relative: 1 %
HCT: 27.4 % — ABNORMAL LOW (ref 36.0–46.0)
Hemoglobin: 8.9 g/dL — ABNORMAL LOW (ref 12.0–15.0)
Immature Granulocytes: 1 %
Lymphocytes Relative: 11 %
Lymphs Abs: 0.7 10*3/uL (ref 0.7–4.0)
MCH: 29.1 pg (ref 26.0–34.0)
MCHC: 32.5 g/dL (ref 30.0–36.0)
MCV: 89.5 fL (ref 80.0–100.0)
Monocytes Absolute: 0.3 10*3/uL (ref 0.1–1.0)
Monocytes Relative: 6 %
Neutro Abs: 4.7 10*3/uL (ref 1.7–7.7)
Neutrophils Relative %: 81 %
Platelets: 170 10*3/uL (ref 150–400)
RBC: 3.06 MIL/uL — ABNORMAL LOW (ref 3.87–5.11)
RDW: 15.9 % — ABNORMAL HIGH (ref 11.5–15.5)
WBC: 5.9 10*3/uL (ref 4.0–10.5)
nRBC: 0 % (ref 0.0–0.2)

## 2018-08-23 LAB — T4, FREE: Free T4: 1.75 ng/dL (ref 0.82–1.77)

## 2018-08-23 LAB — BASIC METABOLIC PANEL
Anion gap: 8 (ref 5–15)
BUN: 11 mg/dL (ref 6–20)
CO2: 27 mmol/L (ref 22–32)
Calcium: 7.4 mg/dL — ABNORMAL LOW (ref 8.9–10.3)
Chloride: 97 mmol/L — ABNORMAL LOW (ref 98–111)
Creatinine, Ser: 1.25 mg/dL — ABNORMAL HIGH (ref 0.44–1.00)
GFR calc Af Amer: 57 mL/min — ABNORMAL LOW (ref >60)
GFR calc non Af Amer: 49 mL/min — ABNORMAL LOW (ref >60)
Glucose, Bld: 101 mg/dL — ABNORMAL HIGH (ref 70–99)
Potassium: 3.7 mmol/L (ref 3.5–5.1)
Sodium: 132 mmol/L — ABNORMAL LOW (ref 135–145)

## 2018-08-23 LAB — TSH: TSH: 6.549 u[IU]/mL — ABNORMAL HIGH (ref 0.350–4.500)

## 2018-08-23 MED ORDER — BACLOFEN 10 MG PO TABS
10.0000 mg | ORAL_TABLET | Freq: Three times a day (TID) | ORAL | Status: DC
  Administered 2018-08-23 – 2018-08-31 (×26): 10 mg via ORAL
  Filled 2018-08-23 (×27): qty 1

## 2018-08-23 NOTE — Progress Notes (Signed)
At 2000, I & O cath=325cc's. At 0425, I & O cath=175cc's. Large, incontinent, loose stool this AM. Bilateral prevalon boots in place. At 0430, N & V, PRN zofran given, also complained of back pain, PRN oxy IR given. Patrici Ranks A

## 2018-08-23 NOTE — Progress Notes (Signed)
Occupational Therapy Session Note  Patient Details  Name: Sarah [name] MRN: [id number] Date of Birth: October 06, 1965  Today's Date: 08/23/2018 OT Individual Time: 2025-4270 and 1405-1430 OT Individual Time Calculation (min): 45 min and 25 min OT missed Time: 35 min ( Nursing Care)  Short Term Goals: Week 1:  OT Short Term Goal 1 (Week 1): Pt will tolerate sitting EOB for 5 minutes with CGA in prep for functional task OT Short Term Goal 2 (Week 1): Pt will complete sliding board transfer EOB>w/c with mod A OT Short Term Goal 3 (Week 1): Pt will thread one LE into pants with max A using AE PRN from bed level  Skilled Therapeutic Interventions/Progress Updates:    Session One: Pt seen for OT session focusing on ADL re-training. Pt in side-lying upon arrival, agreeable to tx session despite lethargy.  BP in supine with TED hose donned 78/49. RN aware.  Addressed LB dressing from bed level as well as problem solving threading of pants with wound vac on buttock and ensuring courds from wound vac are not creating pressure on buttock/LEs. RN to re-thread pt's wound vac following this session when they cath pt in prep for PM OT session. Using hospital bed functions, worked on positioning pt in modified supported long and circle sit positions in order to assist with threading LEs into pants using reacher. Min A from therapist for management of LEs in order to thread pants over LEs and VCs for technique/problem solving.  Pants not pulled up as pt to remain in supine at end of session in prep for cathing. Pt left set-up with lunch tray and all needs in reach.   Session Two: Therapist initially arrived at 1315. Cathing still had not been complete. Pt admitted she had not called for cathing to be complete despite knowing she was past due. Cont to provide education and encouragement for directing care and self- advocacy. Pt remained in supine and therapist to return at later time once pt cathed and ready for  transfer OOB. Therapist returned at 1405, pt reports having been cathed and ready for tx session. Pt with pants and TED hose donned, shoes donned total A. BP in supine, 78/54. She transferred to sitting EOB with max A +1 using hospital bed functions and working on pt directing caregiver for mobility techniques. BP EOB 120/85. She completed max A sliding board transfer to power w/c, total A for management of LEs.  Pt managed power w/c out of room and throughout unit mod I. Mirror placed in front of pt for visual feedback of positioning of hips. Education/addressing pt directing caregivers in how to weightshift in w/c and align pelvis. Pt left up in power w/c at end of session with hand off to PT.    Therapy Documentation Precautions:  Precautions Precautions: Fall Precaution Comments: stage 4 sacral wound Restrictions Weight Bearing Restrictions: No   Therapy/Group: Individual Therapy  Sarah [name] 08/23/2018, 6:57 AM

## 2018-08-23 NOTE — Consult Note (Addendum)
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  Pt remains with loose incontinent stool that could potentially cause the loss of VAC seal. Alternate orders that can be implemented if NPWT (VAC) seal is lost is NS moist kerlix dressing.   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 Skin: both wounds are overall very clean, bone palpable. Drainage (amount, consistency, odor) Moderate serosanguinous, no odor.   Periwound: Rectum is 2 cm from right ischial wound. Dressing procedure/placement/frequency: Cleanse wounds to left and right ischial tuberosities with NS.  Fill wound depth with 1 piece black foam to each wound bed. Right ischial periwound is protected with 2 barrier rings to promote seal.  Two trac pads are Y connected.  Connected to NPWT (VAC) at 125 mHg   Pt denies c/o pain for the procedure.  [facility] team will follow and change M/W/F at 0800 before patient is up and out of bed for therapy. Julien Girt MSN, RN, [place], [place], [place]

## 2018-08-23 NOTE — Progress Notes (Signed)
Physical Therapy Session Note  Patient Details  Name: Sarah [name] MRN: [id number] Date of Birth: 07-05-1965  Today's Date: 08/23/2018 PT Individual Time: 1020-1100 PT Individual Time Calculation (min): 40 min   Short Term Goals: Week 1:  PT Short Term Goal 1 (Week 1): Pt will complete bed mobility with mod A PT Short Term Goal 2 (Week 1): Pt will complete least restrictive transfer with mod A PT Short Term Goal 3 (Week 1): Pt will demonstrate ability to perform effective pressure relief in w/c   Skilled Therapeutic Interventions/Progress Updates:   Pt in supine and agreeable to therapy, pain as detailed below. Pt very sleepy but denies dizziness or lethargy. BP 76/52 in supine, made RN aware. Deferred OOB therapy this morning 2/2 low BP. Donned TEDs for BP management and provided PROM hamstring and gastroc stretch 30 sec x3 within available range. Assisted w/ repositioning on L side to take pressure off sacrum/back. Pt noted to be incontinent of bowel. R/L rolling w/ min-mod assist multiple reps for thoroughness of cleaning, total assist for pericare and to don new brief. Repositioned in L sidelying w/ pillows for support, pressure relief, and for pain relief. Ended session in sidelying, all needs in reach.   Therapy Documentation Precautions:  Precautions Precautions: Fall Precaution Comments: stage 4 sacral wound Restrictions Weight Bearing Restrictions: No Vital Signs: Therapy Vitals BP: (!) 76/52 Patient Position (if appropriate): Lying Pain: Pain Assessment Pain Scale: 0-10 Pain Score: 4  Faces Pain Scale: Hurts little more Pain Type: Acute pain Pain Location: Generalized  Therapy/Group: Individual Therapy  Alzora Ha K [place] 08/23/2018, 11:06 AM

## 2018-08-23 NOTE — Progress Notes (Signed)
[facility] PHYSICAL MEDICINE & REHABILITATION PROGRESS NOTE   Subjective/Complaints: Pt up in bed. No new issues. Still feels "foggy" at times. bp low still over the weekend.   ROS: Patient denies fever, rash, sore throat, blurred vision, nausea, vomiting, diarrhea, cough, shortness of breath or chest pain, joint or back pain, headache, or mood change.    Objective:   No results found. No results for input(s): WBC, HGB, HCT, PLT in the last 72 hours. No results for input(s): NA, K, CL, CO2, GLUCOSE, BUN, CREATININE, CALCIUM in the last 72 hours.  Intake/Output Summary (Last 24 hours) at 08/23/2018 0959 Last data filed at 08/23/2018 4481 Gross per 24 hour  Intake 577 ml  Output 175 ml  Net 402 ml     Physical Exam: Vital Signs Blood pressure (!) 108/55, pulse (!) 58, temperature 98.1 F (36.7 C), temperature source Oral, resp. rate 16, height 5\' 11"  (1.803 m), weight 89.8 kg, last menstrual period 10/22/2016, SpO2 100 %. Constitutional: No distress . Vital signs reviewed. HEENT: EOMI, oral membranes moist Neck: supple Cardiovascular: RRR without murmur. No JVD    Respiratory: CTA Bilaterally without wheezes or rales. Normal effort    GI: BS +, non-tender, non-distended  Musc: Lower extremity edema Neurological: Somnolent Follows full commands.  Motor: Bilateral upper extremities: Grossly 4/5 proximal to distal, stable.  Bilateral lower extremities: 0/5 proximal to distal, stable, extensor tone Sensation absent to light touch Skin: Skin iswarm. She isnot diaphoretic. Buttock wounds both dressed with vac in place Psychiatric: She has a normal mood and affect. Herbehavior is normal.    Assessment/Plan: 1. Functional deficits secondary to debility related to decubitus ulcers/UTI in setting of thoracic paraplegia which require 3+ hours per day of interdisciplinary therapy in a comprehensive inpatient rehab setting.  Physiatrist is providing close team supervision and 24  hour management of active medical problems listed below.  Physiatrist and rehab team continue to assess barriers to discharge/monitor patient progress toward functional and medical goals  Care Tool:  Bathing    Body parts bathed by patient: Right arm, Left arm, Chest, Abdomen, Face, Front perineal area, Right upper leg, Left upper leg   Body parts bathed by helper: Right lower leg, Left lower leg, Buttocks Body parts n/a: Buttocks(Sacral wounds with dressings)   Bathing assist Assist Level: Moderate Assistance - Patient 50 - 74%     Upper Body Dressing/Undressing Upper body dressing   What is the patient wearing?: Pull over shirt    Upper body assist Assist Level: Moderate Assistance - Patient 50 - 74%    Lower Body Dressing/Undressing Lower body dressing      What is the patient wearing?: Incontinence brief, Pants     Lower body assist Assist for lower body dressing: Total Assistance - Patient < 25%     Toileting Toileting Toileting Activity did not occur Landscape architect and hygiene only): Safety/medical concerns  Toileting assist Assist for toileting: Dependent - Patient 0%     Transfers Chair/bed transfer  Transfers assist     Chair/bed transfer assist level: Maximal Assistance - Patient 25 - 49%     Locomotion Ambulation   Ambulation assist   Ambulation activity did not occur: N/A          Walk 10 feet activity   Assist  Walk 10 feet activity did not occur: N/A        Walk 50 feet activity   Assist Walk 50 feet with 2 turns activity did not occur: N/A  Walk 150 feet activity   Assist Walk 150 feet activity did not occur: N/A         Walk 10 feet on uneven surface  activity   Assist Walk 10 feet on uneven surfaces activity did not occur: N/A         Wheelchair     Assist Will patient use wheelchair at discharge?: Yes Type of Wheelchair: Power    Wheelchair assist level: Supervision/Verbal  cueing Max wheelchair distance: 150'    Wheelchair 50 feet with 2 turns activity    Assist        Assist Level: Supervision/Verbal cueing   Wheelchair 150 feet activity     Assist     Assist Level: Supervision/Verbal cueing    Medical Problem List and Plan: 1.Debility/paraplegiasecondary to sacral decubitus status post debridement with 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 Antithrombotics: -DVT/anticoagulation:Chronic Xarelto for history of pulmonary emboli -antiplatelet therapy: N/A 3. Pain Management:Baclofen 10 mg 3 times daily, oxycodone as needed  -Lyrica on hold due to blood pressure    4. Mood:Zoloft 50 mg daily -antipsychotic agents: N/A 5. Neuropsych: This patientiscapable of making decisions on herown behalf. 6. Skin/Wound Care:Continuesantyl andhydrotherapy per PT   -eventual Vac placement when adequate fibronecrotic tissue removal  -dced clindamycin 7. Fluids/Electrolytes/Nutrition: encourage PO 8.Acute on chronic anemia.   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   Establish bowel program in evening  -I/O cath schedule, voiding trial  -Completed course of Rocephin 10. Hypothyroidism. Synthroid 11. Hypotension:   Strong autonomic component  -decrease baclofen to 10mg   -holding lyrica  -  thyroid studies pending  -encourage fluids  -consider florinef trial  Echo   from 2019 EF of 55 to 60%   -?relation to oxycodone which she's using fairly regularly 12. .  CKD stage III  Creatinine 1.21 on 5/27, labs pending  LOS: 6 days A FACE TO [place] 08/23/2018, 9:59 AM

## 2018-08-23 NOTE — Progress Notes (Signed)
PA notified of BP 76/45, no new orders. Continue plan of care. Audie Clear, LPN

## 2018-08-23 NOTE — Progress Notes (Signed)
Occupational Therapy Session Note  Patient Details  Name: Sarah [name] MRN: [id number] Date of Birth: 06/02/1965  Today's Date: 08/23/2018 OT Individual Time: 9381-8299 OT Individual Time Calculation (min): 29 min   Short Term Goals: Week 1:  OT Short Term Goal 1 (Week 1): Pt will tolerate sitting EOB for 5 minutes with CGA in prep for functional task OT Short Term Goal 2 (Week 1): Pt will complete sliding board transfer EOB>w/c with mod A OT Short Term Goal 3 (Week 1): Pt will thread one LE into pants with max A using AE PRN from bed level  Skilled Therapeutic Interventions/Progress Updates:    Pt greeted in bed, reporting back pain to be manageable for tx. Agreeable to complete UB self care. Pt able to pull herself up with bedrails to achieve long sitting with Mod A. She required Max A to prevent posterior LOB while B UEs were engaged in task. Due to reported difficultly/discomfort, pts position was changed to semi reclined in bed. This is how she normally gets washed up at home. Pt required Min A for washing back. She pulled herself into long sitting when donning shirt. While lowering shirt over trunk, she required Mod A for balance with unilateral support on bedrail. Setup required for oral care and grooming tasks. At end of session pt boosted herself up in bed with use of trendelenburg function. She was left with all needs within reach. RN made aware of her request for pain medication at end of session.   Therapy Documentation Precautions:  Precautions Precautions: Fall Precaution Comments: stage 4 sacral wound Restrictions Weight Bearing Restrictions: No Vital Signs: Therapy Vitals BP: (!) 76/52 Patient Position (if appropriate): Lying Pain: Pain Assessment Pain Scale: 0-10 Pain Score: 4  Faces Pain Scale: Hurts little more Pain Type: Acute pain Pain Location: Generalized Pain Intervention(s): Medication (See eMAR) ADL:       Therapy/Group: Individual Therapy  [name]  A Keaten Mashek 08/23/2018, 11:52 AM

## 2018-08-23 NOTE — Progress Notes (Signed)
Physical Therapy Session Note  Patient Details  Name: Sarah [name] MRN: [id number] Date of Birth: 06-26-1965  Today's Date: 08/23/2018 PT Individual Time: 1430-1530 PT Individual Time Calculation (min): 60 min   Short Term Goals: Week 1:  PT Short Term Goal 1 (Week 1): Pt will complete bed mobility with mod A PT Short Term Goal 2 (Week 1): Pt will complete least restrictive transfer with mod A PT Short Term Goal 3 (Week 1): Pt will demonstrate ability to perform effective pressure relief in w/c   Skilled Therapeutic Interventions/Progress Updates:    Pt received seated in power w/c in dayroom from OT session, agreeable to PT. No complaints of pain. Slide board transfer w/c to/from mat table with mod A going slightly downhill. Session focus on seated balance and core strengthening while seated EOM. Lateral leans L/R x 20 reps each direction with min to CGA to return to sitting upright with use of triceps and lateral trunk flexors. Sitting with anterior external perturbations with focus of pt catching her balance and leaning anteriorly to prevent falling posteriorly, min A to catch balance. Seated extended pec stretch 4 x 60 sec each with focus on pt maintaining upright sitting posture. Pt left seated in power w/c in room with needs in reach at end of session. Pt able to correctly verbalize her pressure relief schedule to be performed while seated up in chair.  Therapy Documentation Precautions:  Precautions Precautions: Fall Precaution Comments: stage 4 sacral wound Restrictions Weight Bearing Restrictions: No    Therapy/Group: Individual Therapy   Excell Seltzer, PT, DPT  08/23/2018, 3:45 PM

## 2018-08-24 ENCOUNTER — Inpatient Hospital Stay (HOSPITAL_COMMUNITY): Payer: Medicare Other | Admitting: Physical Therapy

## 2018-08-24 ENCOUNTER — Inpatient Hospital Stay (HOSPITAL_COMMUNITY): Payer: Medicare Other | Admitting: Occupational Therapy

## 2018-08-24 DIAGNOSIS — I951 Orthostatic hypotension: Secondary | ICD-10-CM

## 2018-08-24 LAB — GLUCOSE, CAPILLARY: Glucose-Capillary: 91 mg/dL (ref 70–99)

## 2018-08-24 MED ORDER — ENSURE MAX PROTEIN PO LIQD: 11.0000 [oz_av] | Freq: Every day | ORAL | Status: DC

## 2018-08-24 MED ORDER — ENSURE MAX PROTEIN PO LIQD
11.0000 [oz_av] | Freq: Two times a day (BID) | ORAL | Status: DC
  Administered 2018-08-24 – 2018-08-30 (×9): 11 [oz_av] via ORAL
  Filled 2018-08-24 (×14): qty 330

## 2018-08-24 MED ORDER — FLUDROCORTISONE ACETATE 0.1 MG PO TABS
0.1000 mg | ORAL_TABLET | Freq: Every day | ORAL | Status: DC
  Administered 2018-08-25 – 2018-09-03 (×10): 0.1 mg via ORAL
  Filled 2018-08-24 (×12): qty 1

## 2018-08-24 NOTE — Progress Notes (Signed)
Recreational Therapy Session Note  Patient Details  Name: AZALIAH CARRERO MRN: [id number] Date of Birth: 09-24-1965 Today's Date: 08/24/2018 Time:  1015-11 & 1-2 Pain: c/o HA, RN aware, premedicated  Skilled Therapeutic Interventions/Progress Updates:  Session 1:  Pt with c/o migraine this morning, stating she wanted to do therapy.  BP assessed and noted in PT notes.  Session focused on bed mobility, static sitting balance and lateral leans during co-treat with PT.  Pt required mod-max assist to come up from lateral leans & mod assist for anterior/posterior leans.  Discussed leisure interests which revolve around her family, especially her nephew Triston.    Session 2:  Pt with c/o HA, stated a little better than earlier in the day, agreeable to therapy. Session focused on educating pt on the importance of directing her care, specifically bed mobility/turning schedule & cath schedule.  Session also focused on bed mobility, dynamic sitting balance EOM .  Pt stated understanding and agreement.  Pt performed bed mobility with Max assist and supine to sit EOB with elevated HOB.  Pt required max-total assist for balance EOB.  Discussed leisure interest with focus on her nephew and plan to incorporate crafts into therapy sessions seated EOB/EOM>  Therapy/Group: Co-Treatment with PT Session 1     Cotreatment with OT   Session 2 Kamrin Spath 08/24/2018, 2:27 PM

## 2018-08-24 NOTE — Progress Notes (Signed)
Initial Nutrition Assessment  RD working remotely.  DOCUMENTATION CODES:   Not applicable  INTERVENTION:   - Continue Pro-stat 30 ml BID, each supplement provides 100 kcals and 15 grams protein  - Continue 1 packet Juven BID, each packet provides 90 calories, 2.5 grams of protein, 8 grams of carbohydrate, and 14 grams of amino acids (supplement contains CaHMB, glutamine, and arginine to promote wound healing)  - Continue MVI with minerals daily  - Ensure Max po BID, each supplement provides 150 kcal and 30 grams of protein  - d/c Ensure Enlive  NUTRITION DIAGNOSIS:   Increased nutrient needs related to wound healing as evidenced by estimated needs.  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 Pt has received inpatient rehab services in the past last being 09/21/17 until 10/30/2017. Pt was discharged to SNF at that time. Pt has since returned home with family. Pt presented 08/10/18 with severe sacral decubitus ulcer that began in late February and gradually worsened since that time. CT the pelvis showed right inferior pubic ramus region gas containing decubitus wound new since February but no CT evidence of osteomyelitis. Pt underwent bedside debridement per general surgery with hydrotherapy initiated. Pt admitted to CIR on 5/26.   Spoke with pt via phone call to room. Pt in good spirits, stating that she is working hard with therapies and is feeling stronger.  Pt reports "I'm not eating too good. I guess I need to eat a little more." Pt states that her appetite is fine and that her  main issue is the phlegm in her throat which makes it difficult to swallow at times. Pt reports that because of this, it takes her longer to eat.  Pt states that PTA, she had a good appetite and was eating well. Pt consumed 3 meals daily that may include meat, creamed potatoes, fresh fruit, and green beans. Pt states that she tried to get more protein by eating more meat. RD discussed other high-protein food choices with pt.  Pt confirms weight loss over the last 5 years. Pt reports that prior to weight loss, she weighed 352 lbs in 2015. Pt states that she had her thyroid removed and that her weight dropped after that. RD discussed importance of maintaining current weight to maintain lean muscle mass and promote healing. Pt expresses understanding.  Pt states that she is consuming Juven and Pro-stat supplements. Pt states that sometimes she can't drink the Ensure Enlive because it is too thick. Pt willing to try Ensure Max instead as it is thinner. RD to order.  Reviewed weight history in chart. Pt with 15.8 kg weight loss since 11/06/17. This is a 14.8% weight loss which is not quite significant for timeframe.  Pt also with a 6.3 kg weight loss since 06/01/18. This is a 6.5% weight loss in just under 3 months which is not quite significant for timeframe. RD will monitor trends.  Reviewed RN edema assessment. Pt with mild pitting generalized edema, non-pitting edema to BLE, and mild pitting perineal edema.  Meal Completion: 15-100% x last 8 recorded meals (variable, averaging  61%)  Medications reviewed and include: Dulcolax, Ensure Enlive BID, Pro-stat 30 ml BID, ferrous sulfate, MVI with minerals, Juven BID, Protonix, Senna  Labs reviewed: sodium 132 (L), chloride 97 (L), hemoglobin 8.9 (L)  UOP: 1100 ml x 24 hours  NUTRITION - FOCUSED PHYSICAL EXAM:  Unable to complete at this time. RD working remotely.  Diet Order:   Diet Order            Diet regular Room service appropriate? Yes;  Fluid consistency: Thin  Diet effective now              EDUCATION NEEDS:   Education needs have been addressed   Skin:  Skin Assessment: Skin Integrity Issues: DTI: right foot, left heel Stage I: left heel Unstageable: right labia Wound Vac: left buttocks, right buttocks  Last BM:  08/24/18 large type 6  Height:   Ht Readings from Last 1 Encounters:  08/17/18 5\' 11"  (1.803 m)    Weight:   Wt Readings from Last 1 Encounters:  08/24/18 91.2 kg    Ideal Body Weight:  70.45 kg  BMI:  Body mass index is 28.03 kg/m.  Estimated Nutritional Needs:   Kcal:  2200-2400  Protein:  115-130 grams  Fluid:  >/= 2.0 L    Gaynell Face, MS, RD, LDN Inpatient Clinical Dietitian Pager: [phone] Weekend/After Hours: [phone]

## 2018-08-24 NOTE — Progress Notes (Signed)
Occupational Therapy Session Note  Patient Details  Name: Sarah [name] MRN: [id number] Date of Birth: 1965/11/05  Today's Date: 08/24/2018 OT Individual Time: 1856-3149 and 1300-1400 OT Individual Time Calculation (min): 60 min and 60 min   Short Term Goals: Week 1:  OT Short Term Goal 1 (Week 1): Pt will tolerate sitting EOB for 5 minutes with CGA in prep for functional task OT Short Term Goal 2 (Week 1): Pt will complete sliding board transfer EOB>w/c with mod A OT Short Term Goal 3 (Week 1): Pt will thread one LE into pants with max A using AE PRN from bed level  Skilled Therapeutic Interventions/Progress Updates:    Session One: Pt seen for OT session focusing on functional mobility and education. Pt in supine upon arrival, voices headache from earlier in the day is improving and willing to participate in therapy as able.  Pt in supine noted to be sitting on B wound drains, leaving impression/denting on buttock. Cont trying to problem solve donning of pants without having pt WBing through wound vac cords. Charge RN made aware and to consult with wound care nurse.  Positioned pt in side-lying, Noted need to change brief, total A for buttock hygiene and positioning in side-lying. Extensive education provided to pt regarding importance of maintaining skin integrity, directing care, self-advocacy and d/c planning. Pt left in side-lying at end of session, only position where she is not laying on wound vac cords. Await further instruction for positioning and wound vac management.   Session Two: Pt seen for OT co-tx with recreational therapist. Pt in supine upon arrival, agreeable to tx session. Pt eating only small amount of breakfast and lunch. Education provided regarding importance of proper diet and nourishment for wound healing.  Pt also noted to be behind on cath time, cont to educate and encourage pt for self- advocacy and taking ownership of care. RN aware and to perform cath following  therapy session. Pt transferred to sitting EOB with max-total A +2. Addressed static/dynamic sitting balance EOB with emphasis on anterior/posterior and R/L weight shift as well as supported upright posture to facilitate full chest expansion. Sitting balance overall close supervision with UE support, min-mod A for support without UE support with constant cuing for anterior and midline weight shift. Lateral leans to R/L and return to midline with CGA.  Pt tolerating sitting EOB ~40 minutes during session.  Pt returned to side-lying at end of session, positioned with pillows and all needs in reach.    Therapy Documentation Precautions:  Precautions Precautions: Fall Precaution Comments: stage 4 sacral wound Restrictions Weight Bearing Restrictions: No Pain: Pain Assessment Pain Scale: 0-10 Pain Score: 2  Faces Pain Scale: Hurts a little bit   Therapy/Group: Individual Therapy  Adja Ruff L 08/24/2018, 6:57 AM

## 2018-08-24 NOTE — Progress Notes (Signed)
[facility] PHYSICAL MEDICINE & REHABILITATION PROGRESS NOTE   Subjective/Complaints: Pt developed a "migraine" last night. Still bothering her this morning. Pain is actually in the back of her head/neck. BP's still running low with activities, pt symptomatic  ROS: Patient denies fever, rash, sore throat, blurred vision, nausea, vomiting, diarrhea, cough, shortness of breath or chest pain,  or mood change.   Objective:   No results found. Recent Labs    08/23/18 0950  WBC 5.9  HGB 8.9*  HCT 27.4*  PLT 170   Recent Labs    08/23/18 0950  NA 132*  K 3.7  CL 97*  CO2 27  GLUCOSE 101*  BUN 11  CREATININE 1.25*  CALCIUM 7.4*    Intake/Output Summary (Last 24 hours) at 08/24/2018 0926 Last data filed at 08/24/2018 0450 Gross per 24 hour  Intake 480 ml  Output 1100 ml  Net -620 ml     Physical Exam: Vital Signs Blood pressure (!) 85/57, pulse 63, temperature (!) 97.4 F (36.3 C), temperature source Oral, resp. rate 16, height 5\' 11"  (1.803 m), weight 91.2 kg, last menstrual period 10/22/2016, SpO2 99 %. Constitutional: No distress . Vital signs reviewed. HEENT: EOMI, oral membranes moist Neck: supple Cardiovascular: RRR without murmur. No JVD    Respiratory: CTA Bilaterally without wheezes or rales. Normal effort    GI: BS +, non-tender, non-distended  Musc: Lower extremity edema Neurological: Somnolent Follows full commands.  Motor: Bilateral upper extremities: Grossly 4/5 proximal to distal, stable.  Bilateral lower extremities: 0/5 proximal to distal, stable, extensor tone 2/4 Sensation absent to light touch Skin: Skin iswarm. She isnot diaphoretic. Buttock wounds both dressed with vac in place, sealed Psychiatric: She has a normal mood and affect. Herbehavior is normal.    Assessment/Plan: 1. Functional deficits secondary to debility related to decubitus ulcers/UTI in setting of thoracic paraplegia which require 3+ hours per day of interdisciplinary  therapy in a comprehensive inpatient rehab setting.  Physiatrist is providing close team supervision and 24 hour management of active medical problems listed below.  Physiatrist and rehab team continue to assess barriers to discharge/monitor patient progress toward functional and medical goals  Care Tool:  Bathing    Body parts bathed by patient: Right arm, Left arm, Chest, Abdomen, Face, Front perineal area, Right upper leg, Left upper leg   Body parts bathed by helper: Right lower leg, Left lower leg, Buttocks Body parts n/a: Buttocks(Sacral wounds with dressings)   Bathing assist Assist Level: Moderate Assistance - Patient 50 - 74%     Upper Body Dressing/Undressing Upper body dressing   What is the patient wearing?: Pull over shirt    Upper body assist Assist Level: Moderate Assistance - Patient 50 - 74%    Lower Body Dressing/Undressing Lower body dressing      What is the patient wearing?: Incontinence brief, Pants     Lower body assist Assist for lower body dressing: Total Assistance - Patient < 25%     Toileting Toileting Toileting Activity did not occur Landscape architect and hygiene only): Safety/medical concerns  Toileting assist Assist for toileting: Dependent - Patient 0%     Transfers Chair/bed transfer  Transfers assist     Chair/bed transfer assist level: Moderate Assistance - Patient 50 - 74%     Locomotion Ambulation   Ambulation assist   Ambulation activity did not occur: N/A          Walk 10 feet activity   Assist  Walk 10 feet  activity did not occur: N/A        Walk 50 feet activity   Assist Walk 50 feet with 2 turns activity did not occur: N/A         Walk 150 feet activity   Assist Walk 150 feet activity did not occur: N/A         Walk 10 feet on uneven surface  activity   Assist Walk 10 feet on uneven surfaces activity did not occur: N/A         Wheelchair     Assist Will patient use  wheelchair at discharge?: Yes Type of Wheelchair: Power    Wheelchair assist level: Supervision/Verbal cueing Max wheelchair distance: 150'    Wheelchair 50 feet with 2 turns activity    Assist        Assist Level: Supervision/Verbal cueing   Wheelchair 150 feet activity     Assist     Assist Level: Supervision/Verbal cueing    Medical Problem List and Plan: 1.Debility/paraplegiasecondary to sacral decubitus status post debridement with history of metastaticHurthlecancer followed by [facility] Continue CIR  --Interdisciplinary Team Conference today   2. Antithrombotics: -DVT/anticoagulation:Chronic Xarelto for history of pulmonary emboli -antiplatelet therapy: N/A 3. Pain Management:Baclofen 10 mg (reduced dose) 3 times daily, oxycodone as needed  -Lyrica on hold due to hypotension    4. Mood:Zoloft 50 mg daily -antipsychotic agents: N/A 5. Neuropsych: This patientiscapable of making decisions on herown behalf. 6. Skin/Wound Care:Continuesantyl andhydrotherapy per PT   -eventual Vac placement when adequate fibronecrotic tissue removal  -dced clindamycin 7. Fluids/Electrolytes/Nutrition: encourage PO 8.Acute on chronic anemia.   Hemoglobin holding at 8.9 6/1  -fe++ supp 9.Neurogenic bowel and bladderas well as Klebsiella UTI.   Establish bowel program in evening, getting closer  -I/O cath schedule   -Completed course of Rocephin 10. Hypothyroidism. Synthroid 11. Hypotension:   Strong autonomic component  -most likely cause is her worsening myelopathy   -decreased baclofen to 10mg  yesterday without much of a change in bp's   -holding lyrica  -  thyroid studies reviewed and WNL  -encourage fluids  -begin florinef trial  Echo   from 2019 EF of 55 to 60%     12. .  CKD stage III  Creatinine 1.25 6/1  LOS: 7 days A FACE TO FACE EVALUATION WAS PERFORMED  [name] 08/24/2018, 9:26 AM

## 2018-08-24 NOTE — Progress Notes (Signed)
Occupational Therapy Session Note  Patient Details  Name: [name] MRN: [id number] Date of Birth: 09/16/1965  Today's Date: 08/24/2018 OT Individual Time: 8832-5498 OT Individual Time Calculation (min): 9 min  and Today's Date: 08/24/2018 OT Missed Time: 21 Minutes Missed Time Reason: Pain   Short Term Goals: Week 1:  OT Short Term Goal 1 (Week 1): Pt will tolerate sitting EOB for 5 minutes with CGA in prep for functional task OT Short Term Goal 2 (Week 1): Pt will complete sliding board transfer EOB>w/c with mod A OT Short Term Goal 3 (Week 1): Pt will thread one LE into pants with max A using AE PRN from bed level  Skilled Therapeutic Interventions/Progress Updates:    Upon entering the room, pt supine in bed with c/o "bad headache" and requesting pain medication. RN notified. OT attempted to encourage pt but she declined this morning. OT reviewed schedule with pt and call bell within reach before exiting the room.   Therapy Documentation Precautions:  Precautions Precautions: Fall Precaution Comments: stage 4 sacral wound Restrictions Weight Bearing Restrictions: No General: General OT Amount of Missed Time: 21 Minutes Vital Signs: Therapy Vitals Temp: (!) 97.4 F (36.3 C) Temp Source: Oral Pulse Rate: 63 BP: (!) 85/57 Patient Position (if appropriate): Lying Oxygen Therapy SpO2: 99 % O2 Device: Room Air Pain: Pain Assessment Pain Scale: 0-10 Pain Score: 2  Faces Pain Scale: Hurts a little bit   Therapy/Group: Individual Therapy  [name] 08/24/2018, 7:14 AM

## 2018-08-24 NOTE — Progress Notes (Addendum)
Physical Therapy Session Note  Patient Details  Name: Sarah [name] MRN: [id number] Date of Birth: Mar 02, 1966  Today's Date: 08/24/2018 PT Individual Time: 1015-1100 PT Individual Time Calculation (min): 45 min   Short Term Goals: Week 1:  PT Short Term Goal 1 (Week 1): Pt will complete bed mobility with mod A PT Short Term Goal 2 (Week 1): Pt will complete least restrictive transfer with mod A PT Short Term Goal 3 (Week 1): Pt will demonstrate ability to perform effective pressure relief in w/c   Skilled Therapeutic Interventions/Progress Updates:    Pt received sidelying on R side in bed, agreeable to PT session. Pt reports 3/10 ongoing migraine since earlier this AM, pain has decreased since onset of symptoms. Sidelying BP 72/43, pt asymptomatic. Dependent to don TEDs. Transitioned bed into sitting position, BP 79/51, pt remains asymptomatic. Rolling L/R in bed with mod A. Supine to sit with max A, HOB elevated and use of bedrail. Session focus on sitting balance on EOB, increased difficulty noted due to sitting on more pliable surface of air mattress vs firm therapy mat from previous sessions. Posterior/anterior leans with min to mod A to catch balance and return to midline. R/L lateral leans x 10 reps each direction with mod to max A to maintain balance and return to midline. Sit to supine max A. Mod A to roll onto L side. Pt left sidelying on L side at end of session to avoid pressure through wounds and wound vac line. Co-treatment session with recreational therapy.  Therapy Documentation Precautions:  Precautions Precautions: Fall Precaution Comments: stage 4 sacral wound Restrictions Weight Bearing Restrictions: No    Therapy/Group: Individual Therapy   Excell Seltzer, PT, DPT  08/24/2018, 12:06 PM

## 2018-08-24 NOTE — Patient Care Conference (Addendum)
Inpatient RehabilitationTeam Conference and Plan of Care Update Date: 08/24/2018   Time: 2:10 PM    Patient Name: Sarah [name]      Medical Record Number: [id number]  Date of Birth: January 10, 1966 Sex: Female         Room/Bed: 4W13C/4W13C-01 Payor Info: Payor: [employer] MEDICARE / Plan: [facility] MEDICARE / Product Type: *No Product type* /    Admitting Diagnosis: SCI Team Debility, malaise; 12-16days  Admit Date/Time:  08/17/2018  2:57 PM Admission Comments: No comment available   Primary Diagnosis:  <principal problem not specified> Principal Problem: <principal problem not specified>  Patient Active Problem List   Diagnosis Date Noted  . CKD (chronic kidney disease), stage III ([place])   . Hypoalbuminemia due to protein-calorie malnutrition ([place])   . Hyperglycemia   . Nasal congestion   . Hypotension   . Acute on chronic anemia   . Febrile illness   . Bacterial skin infection 08/10/2018  . Eustachian tube disorder, left 06/30/2018  . Urinary tract infection 06/30/2018  . Malignant neoplasm ([place]) 06/01/2018  . Metastasis to brain ([place]) 06/01/2018  . Transaminitis 04/09/2018  . Hypoalbuminemia 04/09/2018  . Dyspnea   . Multifocal pneumonia   . Palliative care by specialist   . Goals of care, counseling/discussion   . DNR (do not resuscitate) discussion   . Fecal impaction ([place]) 02/06/2018  . HCAP (healthcare-associated pneumonia) 02/06/2018  . Anemia   . Chronic anticoagulation   . Sacral ulcer ([place]) 02/04/2018  . Fracture   . Acute lower UTI   . Myelopathy ([place]) 09/21/2017  . Muscle spasms of both lower extremities   . Closed fracture of shaft of left femur ([place])   . Acute blood loss anemia   . Hypothyroidism   . Nausea & vomiting 09/15/2017  . Closed fracture of left distal femur ([place]) 09/11/2017  . Paraplegia ([place]) 09/08/2017  . Obesity (BMI 30-39.9) 09/03/2017  . Status post surgery 09/03/2017  . Calculus of gallbladder without cholecystitis without obstruction    . Biliary dyskinesia   . Intractable nausea and vomiting 08/09/2017  . Thrombocytopenia ([place]) 08/09/2017  . Elevated bilirubin 08/09/2017  . AKI (acute kidney injury) ([place]) 08/09/2017  . Chest pain 01/26/2017  . Upper respiratory infection, viral 01/26/2017  . Counseling regarding advanced care planning and goals of care 01/02/2017  . Bilateral rotator cuff syndrome 02/12/2016  . E. coli UTI 08/01/2015  . Constipation due to neurogenic bowel 08/01/2015  . 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 Spastic paraparesis   . Neurogenic bladder   . History of pulmonary embolism   . Post-operative pain   . Metastatic cancer ([place])   . Steroid-induced hyperglycemia   . Depression   . Obstipation   . Thoracic spine tumor 07/06/2015  . 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 ([place]) 07/06/2015  . Spinal cord compression due to malignant neoplasm metastatic to spine ([place])   . Postoperative hypothyroidism   . 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 Spasticity 09/19/2014  . Hypertension 05/01/2014  . Ingrown right big toenail 05/01/2014  . GERD (gastroesophageal reflux disease) 02/14/2014  . Dysphagia, pharyngoesophageal phase 02/06/2014  . Type 2 diabetes mellitus without complication ([place]) 20/25/4270  . Constipation   . Neurogenic bowel   . Paraplegia at T4 level ([place]) 01/20/2014  . Metastatic cancer to spine ([place]) 01/19/2014  . Rotator cuff tendonitis   . 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  Leg weakness, bilateral 12/13/2013    Expected Discharge Date: Expected Discharge Date: 09/03/18  Team Members Present: Physician leading conference: Dr. Alger Simons Social Worker Present: Lennart Pall, LCSW Nurse Present: Dwaine Gale, RN PT Present: Other (comment)(Labarre Ervin Knack, PT) OT Present: Amy Rounds, OT PPS Coordinator present : Gunnar Fusi     Current Status/Progress Goal Weekly Team Focus   Medical   thoracic myelopathy secondary to hurthle cell cancer, neurogenic bladder/bowel. pain issues, orthostasis, wound care--vac  improve functional mobility, pain control  pain mgt, wound care, bp   Bowel/Bladder   Incontinent of loose/mushy bowel. BP in place. I/O cath Q8hrs, cath volumes in range of 200-400cc.   Bulk up bowel, initiate dig stim 37min after supp.  educate patient on fiber and fluid intake.    Swallow/Nutrition/ Hydration             ADL's   mod-max A sliding board transfers; mod A LB B/D bed level; min A UB B/D; total A toileting bed level for in/out cathing and bowel program  Mod A overall  Functional sitting balance, modified ADLs from bed level using AE, functional transfers, education   Mobility   (P) min assit rolling,max assist from supine.Mod to max assit to transfer.  (P) Transfer sitting blance ,pressure relief management and education.   transfers, sitting balance, pressure relief management and education   Communication             Safety/Cognition/ Behavioral Observations            Pain   Scheduled baclofen, PRN tylenol and oxycodone.  pain goal less than 3  assess every 4hours and as needed, educate pain management.   Skin   wound vac to bilateral buttocks, followed by WOC. Moisture associated skin-damage to peri area.   No new skin breakdown  assess skin Q shift and PRN    Rehab Goals Patient on target to meet rehab goals: Yes *See Care Plan and progress notes for long and short-term goals.     Barriers to Discharge  Current Status/Progress Possible Resolutions Date Resolved   Physician    Neurogenic Bowel & Bladder;Wound Care               Nursing                  PT  Medical stability;Incontinence;Neurogenic Bowel & Bladder;Wound Care                 OT                  SLP                SW                Discharge Planning/Teaching Needs:  Pt to return home with sister and brother-in-law who will resume care - intermittent.   Teaching needs TBD.   Team Discussion:  Returning pt to CIR with significant wound issues/ VAC.  Worsening neurologically overall.  MD anticipates still need for VAC at d/c.  I/o cath but with some occasional voids.  VAC tubing a problem with pt sitting on it due to location vs side lying at all times.  Max assist overall.  PT has spoken with DME rep about getting a new cushion.  Team concerned that pt will not have 24/7 coverage and at greater risk at this functional level with VAC and skin concerns.  SW to follow up with pt and family.  Revisions to Treatment Plan:  NA    Continued Need for Acute Rehabilitation Level of Care: The patient requires daily medical management by a physician with specialized training in physical medicine and rehabilitation for the following conditions: Daily direction of a multidisciplinary physical rehabilitation program to ensure safe treatment while eliciting the highest outcome that is of practical value to the patient.: Yes Daily medical management of patient stability for increased activity during participation in an intensive rehabilitation regime.: Yes Daily analysis of laboratory values and/or radiology reports with any subsequent need for medication adjustment of medical intervention for : Wound care problems;Post surgical problems;Neurological problems;Blood pressure problems;Urological problems   I attest that I was present, lead the team conference, and concur with the assessment and plan of the team.   Lennart Pall 08/24/2018, 3:54 PM   Team conference was held via web/ teleconference due to [place] - 19

## 2018-08-25 ENCOUNTER — Ambulatory Visit (HOSPITAL_COMMUNITY): Payer: Medicare Other | Admitting: *Deleted

## 2018-08-25 ENCOUNTER — Inpatient Hospital Stay (HOSPITAL_COMMUNITY): Payer: Medicare Other

## 2018-08-25 ENCOUNTER — Encounter (HOSPITAL_COMMUNITY): Payer: Medicare Other | Admitting: Psychology

## 2018-08-25 DIAGNOSIS — M4714 Other spondylosis with myelopathy, thoracic region: Secondary | ICD-10-CM

## 2018-08-25 MED ORDER — OXYMETAZOLINE HCL 0.05 % NA SOLN
1.0000 | Freq: Two times a day (BID) | NASAL | Status: AC
  Administered 2018-08-25 – 2018-08-27 (×5): 1 via NASAL
  Filled 2018-08-25: qty 30

## 2018-08-25 NOTE — Plan of Care (Signed)
  Problem: Consults Goal: RH SPINAL CORD INJURY PATIENT EDUCATION Description  See Patient Education module for education specifics.  Outcome: Progressing Goal: Skin Care Protocol Initiated - if Braden Score 18 or less Description If consults are not indicated, leave blank or document N/A Outcome: Progressing Goal: Nutrition Consult-if indicated Outcome: Progressing Goal: Diabetes Guidelines if Diabetic/Glucose > 140 Description If diabetic or lab glucose is > 140 mg/dl - Initiate Diabetes/Hyperglycemia Guidelines & Document Interventions  Outcome: Progressing   Problem: RH SKIN INTEGRITY Goal: RH STG SKIN FREE OF INFECTION/BREAKDOWN Description Assess area of decubitus ulcer on buttocks daily and dressing change per order.   Outcome: Progressing Goal: RH STG MAINTAIN SKIN INTEGRITY WITH ASSISTANCE Description STG Maintain Skin Integrity With mod Assistance.  Outcome: Progressing Goal: RH STG ABLE TO PERFORM INCISION/WOUND CARE W/ASSISTANCE Description STG Able To Perform Incision/Wound Care With Mod Assistance.  Outcome: Progressing   Problem: RH SAFETY Goal: RH STG ADHERE TO SAFETY PRECAUTIONS W/ASSISTANCE/DEVICE Description STG Adhere to Safety Precautions With min Assistance/Device.  Outcome: Progressing Goal: RH STG DECREASED RISK OF FALL WITH ASSISTANCE Description STG Decreased Risk of Fall With min Assistance.  Outcome: Progressing   Problem: RH PAIN MANAGEMENT Goal: RH STG PAIN MANAGED AT OR BELOW PT'S PAIN GOAL Description Pain level below 4 on scale of 0-10.  Outcome: Progressing   Problem: RH KNOWLEDGE DEFICIT SCI Goal: RH STG INCREASE KNOWLEDGE OF SELF CARE AFTER SCI Description Pt will demonstrate at least 2 methods to reduce pressure on sacrum.   Outcome: Progressing   Problem: RH Pre-functional/Other (Specify) Goal: RH LTG Pre-functional (Specify) Outcome: Progressing   Problem: SCI BOWEL ELIMINATION Goal: RH STG MANAGE BOWEL WITH  ASSISTANCE Description STG Manage Bowel with min Assistance.  Outcome: Not Progressing Goal: RH STG SCI MANAGE BOWEL WITH MEDICATION WITH ASSISTANCE Description STG SCI Manage bowel with medication with min assistance.  Outcome: Not Progressing Goal: RH STG MANAGE BOWEL W/EQUIPMENT W/ASSISTANCE Description STG Manage Bowel With Equipment With min Assistance  Outcome: Not Progressing Goal: RH STG SCI MANAGE BOWEL PROGRAM W/ASSIST OR AS APPROPRIATE Description STG SCI Manage bowel program with min assist  Outcome: Not Progressing Goal: RH OTHER STG BOWEL ELIMINATION GOALS W/ASSIST Description Other STG Bowel Elimination Goals With min Assistance.  Outcome: Not Progressing   Problem: SCI BLADDER ELIMINATION Goal: RH STG MANAGE BLADDER WITH ASSISTANCE Description STG Manage Bladder With min Assistance  Outcome: Not Progressing

## 2018-08-25 NOTE — Progress Notes (Signed)
Occupational Therapy Weekly Progress Note  Patient Details  Name: Sarah [name] MRN: [id number] Date of Birth: May 18, 1965  Beginning of progress report period: Aug 18, 2018 End of progress report period: August 25, 2018  Today's Date: 08/25/2018 OT Individual Time: 1030-1145 OT Individual Time Calculation (min): 75 min    Patient has met 2 of 3 short term goals.  Pt is making slow but steady progress towards OT goals. She is completing functional transfers via slide board at mod-max A depending on set-up and pt's fatigue/anxiety level. Pt has been limited in OOB tx due to wound vac positioning as she is unable to sit upright without wound vac cords pressing into thighs leading to potential skin breakdown risks. Working with wound care nurses to establish best set-up/positioning to facilitate functional positioning while receiving wound vac services.  OT focus on ADL re-training at bed level using AE and education and work focusing on modified circle sitting or long sitting position as well as using UEs for LE management. Pt able to maintain static sitting balance with supervision-min A with severe posterior pelvic tilt, working on midline sitting and trunk expansion to facilitate full breath support.  Extensive education and support provided for pt to direct caregivers with ADLs/mobility assist and self-advocacy, specifically in regards to positioning and self-care needs.  Pt remains very motivated and hard working during tx sessions and desiring to cont to increase independence with ADLs.   Patient continues to demonstrate the following deficits: muscle weakness and muscle paralysis, decreased cardiorespiratoy endurance, decreased coordination and decreased sitting balance, decreased postural control and decreased balance strategies and therefore will continue to benefit from skilled OT intervention to enhance overall performance with BADL and Reduce care partner burden.  Patient progressing toward  long term goals..  Continue plan of care.  OT Short Term Goals Week 1:  OT Short Term Goal 1 (Week 1): Pt will tolerate sitting EOB for 5 minutes with CGA in prep for functional task OT Short Term Goal 1 - Progress (Week 1): Met OT Short Term Goal 2 (Week 1): Pt will complete sliding board transfer EOB>w/c with mod A OT Short Term Goal 2 - Progress (Week 1): Progressing toward goal OT Short Term Goal 3 (Week 1): Pt will thread one LE into pants with max A using AE PRN from bed level OT Short Term Goal 3 - Progress (Week 1): Met Week 2:  OT Short Term Goal 1 (Week 2): STG=LTG due to LOS  Skilled Therapeutic Interventions/Progress Updates:    Pt seen for OT session focusing on ADL re-training, functional transfers, education, and sitting balance/ tolerance. Pt in supine upon arrival, agreeable to tx session and co-tx with recreational therapist. Pt donned doffed socks from bed level using reacher with min-mod A for management of LEs in order to reach foot. Same method to assist pt with threading on of pants. Wound vac placement changed and pt now more easily able to don pants, however, cont with concern of pt sitting on or leaning up against wound vac cord when in sitting position or supine in bed. RN assessed and will cont to problem solve best option with wound care team.  Pt transferred to sitting EOB with max A +1 using hospital bed functions with emphasis on pt directing caregiver with transfer method.  +2 mod-max A transfer to power via sliding board, pt with more difficulty transferring onto softer Roho cushion. Extensive education/discussion regarding pt's position in w/c, need for pressure relief, care at d/c  and d/c planning. She cont to need lots of support and encouragement for managing care and self-advocacy. She transferred onto therapy mat with max A +1. Sat unsupported EOM with bedside table placed in front of her for UE support. Completed x2 sets of 5 controlled anterior/posterior  weight shifts on EOM in prep for functional transfers. Supported reclined rest breaks from EOM required btwn trials. Pt returned to power w/c at end of session in same manner as described above.  Pt returned to room at end of session, left seated in power w/c with all needs in reach and cont education regarding importance of pressure relief.   Therapy Documentation Precautions:  Precautions Precautions: Fall Precaution Comments: stage 4 sacral wound Restrictions Weight Bearing Restrictions: No Pain: Pain Assessment Pain Scale: 0-10 Pain Score: 0-No pain   Therapy/Group: Individual Therapy  Jesiah Grismer L 08/25/2018, 6:53 AM

## 2018-08-25 NOTE — Progress Notes (Addendum)
[facility] PHYSICAL MEDICINE & REHABILITATION PROGRESS NOTE   Subjective/Complaints: No new issues except ear ache on Left side, some nasal congestion   ROS: Patient denies CP, SOB, N/V/D Objective:   No results found. Recent Labs    08/23/18 0950  WBC 5.9  HGB 8.9*  HCT 27.4*  PLT 170   Recent Labs    08/23/18 0950  NA 132*  K 3.7  CL 97*  CO2 27  GLUCOSE 101*  BUN 11  CREATININE 1.25*  CALCIUM 7.4*    Intake/Output Summary (Last 24 hours) at 08/25/2018 0849 Last data filed at 08/25/2018 2094 Gross per 24 hour  Intake -  Output 650 ml  Net -650 ml     Physical Exam: Vital Signs Blood pressure (!) 94/58, pulse 72, temperature 97.6 F (36.4 C), temperature source Oral, resp. rate 19, height 5\' 11"  (1.803 m), weight 91.2 kg, last menstrual period 10/22/2016, SpO2 100 %. Constitutional: No distress . Vital signs reviewed. HEENT: EOMI, Left TM normal, ext auditory canal normal  Neck: supple Cardiovascular: RRR without murmur. No JVD    Respiratory: CTA Bilaterally without wheezes or rales. Normal effort    GI: BS +, non-tender, non-distended  Musc: Lower extremity edema Neurological: Somnolent Follows full commands.  Motor: Bilateral upper extremities: Grossly 4/5 proximal to distal, stable.  Bilateral lower extremities: 0/5 proximal to distal, stable, extensor tone 2/4 Sensation absent to light touch Skin: Skin iswarm. She isnot diaphoretic. Buttock wounds both dressed with vac in place, sealed Psychiatric: She has a normal mood and affect. Herbehavior is normal.    Assessment/Plan: 1. Functional deficits secondary to debility related to decubitus ulcers/UTI in setting of thoracic paraplegia which require 3+ hours per day of interdisciplinary therapy in a comprehensive inpatient rehab setting.  Physiatrist is providing close team supervision and 24 hour management of active medical problems listed below.  Physiatrist and rehab team continue to assess  barriers to discharge/monitor patient progress toward functional and medical goals  Care Tool:  Bathing    Body parts bathed by patient: Right arm, Left arm, Chest, Abdomen, Face, Front perineal area, Right upper leg, Left upper leg   Body parts bathed by helper: Right lower leg, Left lower leg, Buttocks Body parts n/a: Buttocks(Sacral wounds with dressings)   Bathing assist Assist Level: Moderate Assistance - Patient 50 - 74%     Upper Body Dressing/Undressing Upper body dressing   What is the patient wearing?: Pull over shirt    Upper body assist Assist Level: Moderate Assistance - Patient 50 - 74%    Lower Body Dressing/Undressing Lower body dressing      What is the patient wearing?: Incontinence brief, Pants     Lower body assist Assist for lower body dressing: Total Assistance - Patient < 25%     Toileting Toileting Toileting Activity did not occur Landscape architect and hygiene only): Safety/medical concerns  Toileting assist Assist for toileting: Dependent - Patient 0%     Transfers Chair/bed transfer  Transfers assist     Chair/bed transfer assist level: Moderate Assistance - Patient 50 - 74%     Locomotion Ambulation   Ambulation assist   Ambulation activity did not occur: N/A          Walk 10 feet activity   Assist  Walk 10 feet activity did not occur: N/A        Walk 50 feet activity   Assist Walk 50 feet with 2 turns activity did not occur: N/A  Walk 150 feet activity   Assist Walk 150 feet activity did not occur: N/A         Walk 10 feet on uneven surface  activity   Assist Walk 10 feet on uneven surfaces activity did not occur: N/A         Wheelchair     Assist Will patient use wheelchair at discharge?: Yes Type of Wheelchair: Power    Wheelchair assist level: Supervision/Verbal cueing Max wheelchair distance: 150'    Wheelchair 50 feet with 2 turns activity    Assist         Assist Level: Supervision/Verbal cueing   Wheelchair 150 feet activity     Assist     Assist Level: Supervision/Verbal cueing    Medical Problem List and Plan: 1.Debility/paraplegiasecondary to sacral decubitus status post debridement with history of metastaticHurthlecancer followed by [facility] Continue CIR  --Interdisciplinary Team Conference today   2. Antithrombotics: -DVT/anticoagulation:Chronic Xarelto for history of pulmonary emboli -antiplatelet therapy: N/A 3. Pain Management:Baclofen 10 mg (reduced dose) 3 times daily, oxycodone as needed  -Lyrica on hold due to hypotension    4. Mood:Zoloft 50 mg daily -antipsychotic agents: N/A 5. Neuropsych: This patientiscapable of making decisions on herown behalf. 6. Skin/Wound Care:Continuesantyl andhydrotherapy per PT   -eventual Vac placement when adequate fibronecrotic tissue removal  -dced clindamycin 7. Fluids/Electrolytes/Nutrition: encourage PO 8.Acute on chronic anemia.   Hemoglobin holding at 8.9 6/1  -fe++ supp 9.Neurogenic bowel and bladderas well as Klebsiella UTI.   Establish bowel program in evening, getting closer  -I/O cath schedule   -Completed course of Rocephin 10. Hypothyroidism. Synthroid 11. Hypotension:   Strong autonomic component  -most likely cause is her worsening myelopathy   -decreased baclofen to 10mg  yesterday without much of a change in bp's   -holding lyrica  -  thyroid studies reviewed and WNL  -encourage fluids  -begin florinef trial  Echo   from 2019 EF of 55 to 60%     Vitals:   08/24/18 2000 08/25/18 0617  BP: 93/63 (!) 94/58  Pulse: 68 72  Resp: 17 19  Temp: 97.7 F (36.5 C) 97.6 F (36.4 C)  SpO2: 97% 100%   12. .  CKD stage III  Creatinine 1.25 6/1  LOS: 8 days A FACE TO FACE EVALUATION WAS PERFORMED  [name] 08/25/2018, 8:49 AM

## 2018-08-25 NOTE — Consult Note (Addendum)
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 Pt remains with loose incontinent stool that could potentially cause the loss of VAC seal. Alternate orders that can be implemented if NPWT (VAC) seal is lost: NS moist kerlix dressing.   2 separate tubing system with Y connector has been difficult for the therapy teams to work around when the patient is out of bed, and also to reduce pressure from the tubings.  Will attempt to bridge system together to one tubing and track pad today.  This will be complicated, since location is close to the rectum.  Wound type:Stage 4 pressure injuries to both locations Pressure Injury POA: Yes Wound bed: refer to previous assessments, unchanged since previous assessment. Drainage (amount, consistency, odor)Moderate amt serosanguinous drainage, no odor. Dressing procedure/placement/frequency:Cleanse wounds to left and right ischial tuberosities with NS. Fill wound depth with 1 piece black foam to each wound bed. Right ischial periwound is protected with 2 barrier rings to promote seal. Bridged 2 wounds together above the rectum using a horseshoe-shaped piece of foam.Connected to NPWT (VAC) at 125 mHg  Pt denies c/o pain for the procedure.  [place] team will follow and change M/W/F at 0800 before patient is up and out of bed for therapy. Julien Girt MSN, RN, [place], [place], [place]

## 2018-08-25 NOTE — Progress Notes (Signed)
Recreational Therapy Session Note  Patient Details  Name: Sarah [name] MRN: [id number] Date of Birth: 11-Jul-1965 Today's Date: 08/25/2018 Time:  1030-1130; 1330-1430 Pain: no  c/o Skilled Therapeutic Interventions/Progress Updates: Pt seen today during co-treat with OT in AM session with focus on bed mobility, slide board transfers, sitting balance EOB/EOM.  Extensive education on directing her care.  When up in the w/c, trialed various positions for pressure relief with new location of VAC tubing.  Pt stated understanding.  Second session focused on slide board transfers, activity tolerance, dynamic sitting balance EOM and discharge planning.  Pt performed slide board transfers with mod-max assist.  Pt sat EOM for tabletop card game Juel Burrow) with close supervision-contact guard assist up to 10 minutes for balance.  Pt discussing discharge plans, VAC management, care needs at home.  Pt shared that she and her sister are problem solving through discharge barriers and need for follow up with [place], [place] 08/25/2018, 3:25 PM

## 2018-08-25 NOTE — Progress Notes (Signed)
Physical Therapy Weekly Progress Note  Patient Details  Name: Sarah [name] MRN: [id number] Date of Birth: Aug 08, 1965  Beginning of progress report period: Aug 18, 2018 End of progress report period: August 25, 2018  Today's Date: 08/25/2018 PT Individual Time: 1330-1430 PT Individual Time Calculation (min): 60 min   Patient has met 0 of 3 short term goals.  Pt continues to require mod-max A for bed mobility and slide board transfers and therefore has not met goal of being at mod A level for transfers. Pt also requires cueing to adhere to pressure relief schedule despite ongoing education about importance of pressure relief for healing of current wounds and prevention of further wound development.  Patient continues to demonstrate the following deficits muscle weakness and muscle paralysis, abnormal tone and unbalanced muscle activation and decreased sitting balance, decreased postural control and decreased balance strategies and therefore will continue to benefit from skilled PT intervention to increase functional independence with mobility.  Patient progressing toward long term goals..  Continue plan of care.  PT Short Term Goals Week 1:  PT Short Term Goal 1 (Week 1): Pt will complete bed mobility with mod A PT Short Term Goal 1 - Progress (Week 1): Progressing toward goal PT Short Term Goal 2 (Week 1): Pt will complete least restrictive transfer with mod A PT Short Term Goal 2 - Progress (Week 1): Progressing toward goal PT Short Term Goal 3 (Week 1): Pt will demonstrate ability to perform effective pressure relief in w/c  PT Short Term Goal 3 - Progress (Week 1): Progressing toward goal Week 2:  PT Short Term Goal 1 (Week 2): =LTG due to ELOS  Skilled Therapeutic Interventions/Progress Updates:    Pt received seated in power w/c in room, agreeable to PT session. Pt reports pain in her back, not rated and declines intervention. Pt reports she has been performing pressure relief while  seated in her chair but also was noted to be asleep in chair throughout time she has been sitting up. Reiterated importance of adherence to pressure relief schedule. Session focus on slide board transfers, activity tolerance, and dynamic sitting balance EOM. Slide board transfer w/c to mat table with max A. Pt is able to maintain sitting balance EOM with close SBA to CGA for up to 10 min while engaging in table top card game with use of one UE. Slide board transfer back to w/c with max A. Pt is able to perform w/c mobility mod I. Slide board transfer back to bed with max A. Sit to supine mod A for BLE management. Pt encouraged to lay on her side to avoid pressure on her wounds and on wound vac lines, pt reports feeling nauseous and requests to remain on her back for a short time. Informed RN of pt's nausea. Pt left semi-reclined in bed with needs in reach at end of session. Cotreatment session with RT.  Therapy Documentation Precautions:  Precautions Precautions: Fall Precaution Comments: stage 4 sacral wound Restrictions Weight Bearing Restrictions: No   Therapy/Group: Individual Therapy   Excell Seltzer, PT, DPT 08/25/2018, 4:27 PM

## 2018-08-25 NOTE — Progress Notes (Signed)
Occupational Therapy Session Note  Patient Details  Name: Sarah [name] MRN: [id number] Date of Birth: June 30, 1965  Today's Date: 08/25/2018 OT Individual Time: 8366-2947 OT Individual Time Calculation (min): 54 min    Short Term Goals: Week 1:  OT Short Term Goal 1 (Week 1): Pt will tolerate sitting EOB for 5 minutes with CGA in prep for functional task OT Short Term Goal 1 - Progress (Week 1): Met OT Short Term Goal 2 (Week 1): Pt will complete sliding board transfer EOB>w/c with mod A OT Short Term Goal 2 - Progress (Week 1): Progressing toward goal OT Short Term Goal 3 (Week 1): Pt will thread one LE into pants with max A using AE PRN from bed level OT Short Term Goal 3 - Progress (Week 1): Met  Skilled Therapeutic Interventions/Progress Updates:    1:1. Pt received in bed with request to complete bathing at bed level. Pt completes LB bathing in figure 4 supported long sitting with A to achieve full position and A to wash B feet/lower calves. Pt able to wash peri area and rolls with MIN A using bed rails for OT to wash buttocks and change brief. Pt completes UB bathing with set up and dons shirts with A to sit up into unsupported long sitting for OT to pull shirt down back. Education on sidelying position for pressure relief and improved wound healing provided. Pt verbalizes understanding. Pt completes oral/hair care in supported long sitting, and applies lotion to arms. Pt declines sitting EOB for any task. Pt provided with theraband level 1 for UE therex in bed and OT demo 5 exercises for Pt to complete in free time. Wound care RNs arrive and OT exits with pt seated in bed, exit aalrm on and call light in reach  Therapy Documentation Precautions:  Precautions Precautions: Fall Precaution Comments: stage 4 sacral wound Restrictions Weight Bearing Restrictions: No General:   Vital Signs: Therapy Vitals Temp: 97.6 F (36.4 C) Temp Source: Oral Pulse Rate: 72 Resp: 19 BP: (!)  94/58 Patient Position (if appropriate): Lying Oxygen Therapy SpO2: 100 % O2 Device: Room Air Pain: Pain Assessment Pain Scale: 0-10 Pain Score: 0-No pain ADL:   Vision   Perception    Praxis   Exercises:   Other Treatments:     Therapy/Group: Individual Therapy  Tonny Branch 08/25/2018, 7:54 AM

## 2018-08-25 NOTE — Consult Note (Signed)
Neuropsychological Consultation   Patient:   Sarah [name]   DOB:   Jul 25, 1965  MR Number:  [id number]  Location:  [facility] [id number] [place] [address] Dept: [place]: [phone]           Date of Service:   08/25/2018  Start Time:   9 AM End Time:   10 AM  Provider/Observer:  Ilean Skill, Psy.D.       Clinical Neuropsychologist       Billing Code/Service: 19379  Chief Complaint:    Drina Jobst. 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  The patient had been seen by the inpatient rehab services between September 21, 2017 until October 30, 2017.  The patient was discharged to the [facility] skilled nursing facility.  The patient later returned home and had been living with a sister and brother-in-law the patient presented on 08/10/2018 with severe sacral decubitus ulcer that began in late February gradually worsened since that time as well as fever of 102.5, white blood count 16,800, hemoglobin 6.1.  The patient was started on broad-spectrum antibiotics.  The patient has returned to the comprehensive rehabilitation program for further therapeutic interventions in preparation for returning home.  Reason for Service:  The patient was referred for neuropsychological consultation due to coping and adjustment issues.  Below is the HPI for the current admission.  KWI:OXBDZ S. 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 Melchor Amour has received inpatient rehab services in the past last being 09/21/2017 until 10/30/2017 was discharged to [facility] skilled nursing facility. Patient has since returned home with her sister and brother-in-law nonambulatory using a sliding board with 1 person assist for bed to wheelchair transfers. Sister and brother-in-law all assist in all aspects of bathing dressing toileting and functional mobility. They live in a 1 level home with ramped entrance. Presented 08/10/2018 with severe sacral decubitus ulcer that began in late February gradually worsened since that time as well as fever of 102.5, WBC 16,800, hemoglobin 6.1. Initial blood culture from 518 with growth in 1 bottle GPC likely contaminant. Follow-up blood cultures show no growth to date. Placed on intravenous broad-spectrum antibiotics and transitionedto p.oclindamycin completed 08/17/2018. CT the pelvis showed right inferior pubic ramus region gas containing decubitus wound new since February PET/CTbut no CT evidence of osteomyelitis. Underwent bedside debridement per general surgery with hydrotherapy initiated. Hospital course pain management. She remains on chronic Xarelto as prior to admission. Acute on chronic anemia latest hemoglobin 8.5. Klebsiella UTIcompleting a course of Rocephin. Tolerating a regular diet. Therapy evaluations completed and patient was admitted for a comprehensive rehab program.  Current Status:  The patient denies any significant increase in her depressive symptoms.  She is continuing to take sertraline and denies any significant side effects from this medication.  The patient reports that she is just develop coping skills of "going with the flow."  The patient reports that she has adjusted to her numerous medical issues including her  paraplegia and resulting significant disabilities and motor impairments.  The patient denied any  significant pain at this time but does report that her wound is causing her some pain.  The patient's cognition appeared to be at or near baseline and she was well oriented and showing good mental status.  Behavioral Observation: SAMA ARAUZ  presents as a 53 y.o.-year-old Right Caucasian Female who appeared her stated age. her dress was Appropriate and she was Well Groomed and her manners were Appropriate to the situation.  her participation was indicative of Appropriate and Attentive behaviors.  There were any physical disabilities noted.  she displayed an appropriate level of cooperation and motivation.     Interactions:    Active Appropriate and Attentive  Attention:   within normal limits and attention span and concentration were age appropriate  Memory:   within normal limits; recent and remote memory intact  Visuo-spatial:  not examined  Speech (Volume):  low  Speech:   normal; normal  Thought Process:  Coherent and Relevant  Though Content:  WNL; not suicidal and not homicidal  Orientation:   person, place, time/date and situation  Judgment:   Fair  Planning:   Fair  Affect:    Blunted  Mood:    Dysphoric  Insight:   Good  Intelligence:   normal  Medical History:   Past Medical History:  Diagnosis Date  . Anxiety   . 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 Chronic back pain   . Complication of anesthesia    Nausea and vomiting  . DDD (degenerative disc disease), cervical   . Depression   . DJD (degenerative joint disease)   . Family history of adverse reaction to anesthesia    MOTHER HAS NAUSEA   . GERD (gastroesophageal reflux disease)   . History of blood transfusion    08/2017  . History of rectal fissure   . HIT (heparin-induced thrombocytopenia) ([place])   . Hypertension    05/27/2018- not current- runs low  . Hypothyroidism   . Obesity   . Paraplegia at T4 level ([place])   . Pneumonia 03/2018  . Pulmonary  embolus, right ([place]) 2015  . Rotator cuff tendonitis right       Psychiatric History:  The patient does have a history of anxiety and depression and is gone through significant medical issues and dealing with paraplegia and results of her cancer treatments.  The patient has now developed significant wound.  The patient is taking Zoloft/sertraline for her depression and reports that it is doing well for her symptoms.  Family Med/Psych History:  Family History  Problem Relation Age of Onset  . Hypertension Mother   . Diabetes Mother   . Hypertension Father   . Stroke Father   . Diabetes Father   . Diabetes Sister   . Diabetes Sister     Risk of Suicide/Violence: low the patient denies any suicidal or homicidal ideation.  Impression/DX:  Bo Mcclintock. 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  The patient had been seen by the inpatient rehab services between September 21, 2017 until October 30, 2017.  The patient was discharged to the [facility] skilled nursing facility.  The patient later returned home and had  been living with a sister and brother-in-law the patient presented on 08/10/2018 with severe sacral decubitus ulcer that began in late February gradually worsened since that time as well as fever of 102.5, white blood count 16,800, hemoglobin 6.1.  The patient was started on broad-spectrum antibiotics.  The patient has returned to the comprehensive rehabilitation program for further therapeutic interventions in preparation for returning home.  The patient denies any significant increase in her depressive symptoms.  She is continuing to take sertraline and denies any significant side effects from this medication.  The patient reports that she is just  develop coping skills of "going with the flow."  The patient reports that she has adjusted to her numerous medical issues including her paraplegia and resulting significant disabilities and motor impairments.  The patient denied any significant pain at this time but does report that her wound is causing her some pain.  The patient's cognition appeared to be at or near baseline and she was well oriented and showing good mental status.   Disposition/Plan:  Will follow up with the patient next week to continue to monitor issues of anxiety and depression.         Electronically Signed   _______________________ Ilean Skill, Psy.D.

## 2018-08-26 ENCOUNTER — Inpatient Hospital Stay (HOSPITAL_COMMUNITY): Payer: Medicare Other | Admitting: Physical Therapy

## 2018-08-26 ENCOUNTER — Inpatient Hospital Stay (HOSPITAL_COMMUNITY): Payer: Medicare Other | Admitting: Occupational Therapy

## 2018-08-26 NOTE — Progress Notes (Signed)
[facility] PHYSICAL MEDICINE & REHABILITATION PROGRESS NOTE   Subjective/Complaints: No earache complaints, patient requiring intermittent catheterization for neurogenic bladder cath volumes around 400.  Usually being cath about every 8 hours.  Bowel program at night seems to be working well.  Discussed wound care  ROS: Patient denies CP, SOB, N/V/D Objective:   No results found. Recent Labs    08/23/18 0950  WBC 5.9  HGB 8.9*  HCT 27.4*  PLT 170   Recent Labs    08/23/18 0950  NA 132*  K 3.7  CL 97*  CO2 27  GLUCOSE 101*  BUN 11  CREATININE 1.25*  CALCIUM 7.4*    Intake/Output Summary (Last 24 hours) at 08/26/2018 0834 Last data filed at 08/26/2018 0831 Gross per 24 hour  Intake 478 ml  Output 825 ml  Net -347 ml     Physical Exam: Vital Signs Blood pressure 114/68, pulse 77, temperature 99 F (37.2 C), temperature source Oral, resp. rate 18, height 5\' 11"  (1.803 m), weight 91.2 kg, last menstrual period 10/22/2016, SpO2 97 %. Constitutional: No distress . Vital signs reviewed. HEENT: EOMI, Left TM normal, ext auditory canal normal  Neck: supple Cardiovascular: RRR without murmur. No JVD    Respiratory: CTA Bilaterally without wheezes or rales. Normal effort    GI: BS +, non-tender, non-distended  Musc: Lower extremity edema Neurological: Somnolent Follows full commands.  Motor: Bilateral upper extremities: Grossly 4/5 proximal to distal, stable.  Bilateral lower extremities: 0/5 proximal to distal, stable, extensor tone 2/4 Sensation absent to light touch Skin: Skin iswarm. She isnot diaphoretic. Buttock wounds both dressed with vac in place, sealed Psychiatric: She has a normal mood and affect. Herbehavior is normal.    Assessment/Plan: 1. Functional deficits secondary to debility related to decubitus ulcers/UTI in setting of thoracic paraplegia which require 3+ hours per day of interdisciplinary therapy in a comprehensive inpatient rehab  setting.  Physiatrist is providing close team supervision and 24 hour management of active medical problems listed below.  Physiatrist and rehab team continue to assess barriers to discharge/monitor patient progress toward functional and medical goals  Care Tool:  Bathing    Body parts bathed by patient: Right arm, Left arm, Chest, Abdomen, Face, Front perineal area, Right upper leg, Left upper leg   Body parts bathed by helper: Right lower leg, Left lower leg, Buttocks Body parts n/a: Buttocks(Sacral wounds with dressings)   Bathing assist Assist Level: Moderate Assistance - Patient 50 - 74%     Upper Body Dressing/Undressing Upper body dressing   What is the patient wearing?: Pull over shirt    Upper body assist Assist Level: Moderate Assistance - Patient 50 - 74%    Lower Body Dressing/Undressing Lower body dressing      What is the patient wearing?: Incontinence brief, Pants     Lower body assist Assist for lower body dressing: Maximal Assistance - Patient 25 - 49%     Toileting Toileting Toileting Activity did not occur (Clothing management and hygiene only): Safety/medical concerns  Toileting assist Assist for toileting: Dependent - Patient 0%     Transfers Chair/bed transfer  Transfers assist     Chair/bed transfer assist level: Maximal Assistance - Patient 25 - 49%     Locomotion Ambulation   Ambulation assist   Ambulation activity did not occur: N/A          Walk 10 feet activity   Assist  Walk 10 feet activity did not occur: N/A  Walk 50 feet activity   Assist Walk 50 feet with 2 turns activity did not occur: N/A         Walk 150 feet activity   Assist Walk 150 feet activity did not occur: N/A         Walk 10 feet on uneven surface  activity   Assist Walk 10 feet on uneven surfaces activity did not occur: N/A         Wheelchair     Assist Will patient use wheelchair at discharge?: Yes Type of  Wheelchair: Power    Wheelchair assist level: Independent Max wheelchair distance: 150'    Wheelchair 50 feet with 2 turns activity    Assist        Assist Level: Independent   Wheelchair 150 feet activity     Assist     Assist Level: Independent    Medical Problem List and Plan: 1.Debility/paraplegiasecondary to sacral decubitus status post debridement with history of metastaticHurthlecancer followed by Dr.Kale Continue CIR  --IPT, OT  2. Antithrombotics: -DVT/anticoagulation:Chronic Xarelto for history of pulmonary emboli -antiplatelet therapy: N/A 3. Pain Management:Baclofen 10 mg (reduced dose) 3 times daily, oxycodone as needed  -Lyrica on hold due to hypotension    4. Mood:Zoloft 50 mg daily- appreciate Neuropsych note  -antipsychotic agents: N/A 5. Neuropsych: This patientiscapable of making decisions on herown behalf. 6. Skin/Wound Care:Continuesantyl andhydrotherapy per PT   -wound vac with change M/W/F per [place]  7. Fluids/Electrolytes/Nutrition: encourage PO 8.Acute on chronic anemia.   Hemoglobin holding at 8.9 6/1  -fe++ supp 9.Neurogenic bowel and bladderas well as Klebsiella UTI.   Establish bowel program in evening, getting closer  -I/O cath schedule   -Completed course of Rocephin 10. Hypothyroidism. Synthroid 11. Hypotension:  Vitals:   08/25/18 1513 08/25/18 1952  BP: 93/62 114/68  Pulse: 83 77  Resp: 19 18  Temp: 98 F (36.7 C) 99 F (37.2 C)  SpO2: 100% 97%    Strong autonomic component  -most likely cause is her worsening myelopathy   -decreased baclofen to 10mg  yesterday without much of a change in bp's   -holding lyrica  -  thyroid studies reviewed and WNL  -encourage fluids  -begin florinef trial  Echo   from 2019 EF of 55 to 60%     Vitals:   08/25/18 1513 08/25/18 1952  BP: 93/62 114/68  Pulse: 83 77  Resp: 19 18  Temp: 98 F (36.7 C) 99 F (37.2 C)   SpO2: 100% 97%   12. .  CKD stage III- avoid nephrotoxic meds   Creatinine 1.25 6/1  LOS: 9 days A FACE TO FACE EVALUATION WAS PERFORMED  [name] 08/26/2018, 8:34 AM

## 2018-08-26 NOTE — Progress Notes (Addendum)
Occupational Therapy Session Note  Patient Details  Name: Sarah [name] MRN: [id number] Date of Birth: May 24, 1965  Today's Date: 08/26/2018 OT Individual Time: 0845-1000 and 1302--13:40 OT Individual Time Calculation (min): 75 min and 38 min   Short Term Goals: Week 2:  OT Short Term Goal 1 (Week 2): STG=LTG due to LOS  Skilled Therapeutic Interventions/Progress Updates:    Session One: Pt seen for OT ADL bathing/dressing session. PT in supine upon arrival, denying pain and agreeable to tx session.  She completed LB bathing/dressing from bed level. Max A to place LEs into modified figure four position, able to reach down to ankles and therapist assisting with washing feet. Lotion donned to B LEs in same manner. She threaded pants on using reacher to assist and therapist assisting for LE management with reminders of hemi-dressing technique for less limber L LE. She rolled with min A using hospital bed functions and pt able to reach to pull pants up. TED hose and shoes donned total A. Pt transferred to sitting EOB with max A +1 using hospital bed functions with encouragement for pt to direct therapist/caregiver with technique. Completed max A sliding board transfer to power w/c.  She completed UB bathing/dressing and grooming tasks from w/c level at sink. Assist required to doff shirt 2/2 L UE/chest discomfort with extreme movements and assist to wash back and pull shirt down in the back. Set-up assist for grooming items with pt demonstrating improved anterior weight shift to come up off back of w/c in order to reach sink controls etc.  Pt left seated in power w/c at end of session, all needs in reach. Cont to remind and educate regarding pressure relief schedule.   Session Two: Pt seen for OT session focusing on functional mobility and UE strengthening. Pt sitting up in w/c upon arrival, lethargic, but agreeable to tx session as able. Pt cont to require encouragement for engagement during session,  remains very passive with all care. She was unable to recall when her next need for pressure relief would be, however, reports she has been doing it since PT tx session.  She managed power w/c throughout unit mod I. Completed UE strengthening exercises using #3 dowel rod. Completed the following exercises x2 sets of 10  Chest Press  Overhead press  Bicep curl   Upright row. Rest breaks required throughout and pt requesting to end session 2/2 fatigue and difficulty staying awake. She returned to room and completed max A sliding board transfer to EOB. Mod A to return to supine and mod A for repositioning in bed using hospital bed features. Pt left in supine at end of session ( refusing side-lying position despite education on pressure relief), all needs in reach and RN present.  . Pt cont with complaints of breast pain throughout session, however, reports UE exercises did not make pain worse. RN made aware of discomfort and administered pain medications at end of session.   Therapy Documentation Precautions:  Precautions Precautions: Fall Precaution Comments: stage 4 sacral wound Restrictions Weight Bearing Restrictions: No   Therapy/Group: Individual Therapy  Brentney Goldbach L 08/26/2018, 6:59 AM

## 2018-08-26 NOTE — Progress Notes (Signed)
Social Work Patient ID: Sarah [name], female   DOB: Oct 24, 1965, 53 y.o.   MRN: [id number]   Met with pt and spoke with pt's sister, Sarah [name] via phone yesterday to review team conference information.   Pt was already aware of targeted d/c date of 6/12 and we discussed the goals of moderate assistance overall and of team concerns that her care needs are too great for her family to manage.  Also, discussed that team would recommend pt have 24/7 support which family is unable to provide.  Pt reports that her sister may be able to provide additional assistance but agrees that her care needs may be too great and offers that she has already considered need for SNF.  Pt then reports that her sister and family are going to be gone on their vacation from 6/13 through 6/20.  She notes that she has a friend who "could probably stay with me then."  I asked that she confirm this with friend.  Pt asks that I contact her sister to discuss care needs and determine if they feel they can do this.  Contacted sister, Sarah [name], who fully understands the concerns of pt's care.  Sister very worried that pt will not perform the daily care plan for her own body moving forward and notes primary concern is that she will not follow the prescribed b/b program nor the recommendations for pressure relief.  Sister became tearful and states that she loves for her sister very much and is doing all that she can, however, does not feel that she can provide current support needs.  She is very apprehensive about the wound VAC and b/b needs.    Will discuss concerns further with pt and ask that MD and neuropsychology continue to discuss need for pt to take an active role in her own self care.  May benefit from a Palliative Care referral which she has had in the past (when at [facility] per sister).    Phinneas Shakoor, LCSW

## 2018-08-26 NOTE — Progress Notes (Signed)
Tonight, after given hygiene care, patients dressing seal was broken & there was noted feces under the vac dressing. Dressing to the right buttock had to be removed & a new foam applied. Her dressing is bridged to the left side of her body. There was redness from the tubing. Padding with abd pads was added to relieve some of the pressure. The dressing is in an awkward position. The wound extends toward her right labia & the wound vac drape is very close to her anus, which she has soon loose stools. She is also on a bowel program & is given suppositories nightly with digital stimulation, which also compromises the dressing. When dressing was done, there was suction & the vac was functioning properly. Will continue to monitor for changes

## 2018-08-26 NOTE — Progress Notes (Signed)
Physical Therapy Session Note  Patient Details  Name: SVARA TWYMAN MRN: [id number] Date of Birth: 02/26/1966  Today's Date: 08/26/2018 PT Individual Time: 1030-1145 PT Individual Time Calculation (min): 75 min   Short Term Goals: Week 2:  PT Short Term Goal 1 (Week 2): =LTG due to ELOS  Skilled Therapeutic Interventions/Progress Updates:    Pt received seated in power w/c in room, agreeable to PT session. Pt reports some soreness in her L shoulder, not rated. Hot pack to L shoulder at end of session for pain management. Discussion with patient about her concerns and anxiety about d/c home, pt reports addition of I/O caths and wound vac give her increased apprehension with regards to going home. Power w/c mobility mod I with cueing for pressure relief during session. Slide board transfer w/c to mat table with mod A. Sitting balance EOM perform lateral leans onto elbow with mod A to return to upright position. Anterior/posterior leans with min to mod A to return to upright position. Seated alt UE reaching outside BOS and across midline for bean bags, pt has increase in anxiety in sitting and is fearful of falling, provided encouragement and reassurance that pt was in safe environment to test limits of her sitting balance. Provided education and demonstration of how to use smartcheck system for [employer]. Explained purpose of and importance of having Roho cushion properly inflated to maintain skin integrity and prevent further breakdown of current wounds. Slide board transfer mat table to w/c with max A due to increased difficulty with slide board transfers onto Roho cushion. Pt left seated in power w/c in room with needs in reach at end of session.  Therapy Documentation Precautions:  Precautions Precautions: Fall Precaution Comments: stage 4 sacral wound Restrictions Weight Bearing Restrictions: No    Therapy/Group: Individual Therapy   Excell Seltzer, PT, DPT  08/26/2018,  3:58 PM

## 2018-08-27 ENCOUNTER — Inpatient Hospital Stay (HOSPITAL_COMMUNITY): Payer: Medicare Other | Admitting: Occupational Therapy

## 2018-08-27 ENCOUNTER — Inpatient Hospital Stay (HOSPITAL_COMMUNITY): Payer: Medicare Other | Admitting: Physical Therapy

## 2018-08-27 NOTE — Consult Note (Addendum)
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 Alternate order that can be implemented if NPWT (VAC) seal is lost: NS moist kerlix dressing.   Pt has been incontinent of loose stool and current Vac dressing and bridge system has become soiled behind the dressing.  This was a complicated dressing/system since wounds are located very close to the rectum, and bridging system had to span across buttocks.  I have decided to apply Aquacel and foam dressing to left ischium wound, eliminating the bridge across the buttocks which was connecting the 2 locations.  Now, only the right ischium wound will have Vac dressing applied and bridge can be located to the hip area, decreasing the chance of soiling to the wound and minimizing pressure from the track pad.   Wound type:Stage 4 pressure injuriesto both locations Pressure Injury POA: Yes Left ischium is slowly decreasing in size and depth; 4X4X3cm, 15% yellow, 85% red, no odor, mod amt pink drainage.   Right ischium 11X7X6cm, 85% red, 15% yellow, mod amt pink drainage, no odor Dressing procedure/placement/frequency:Cleanse wounds to left and right ischial tuberosities with NS. Fill left wound with Aquacel, then foam dressing to absorb drainage and provide antimicrobial benefits.  Right ischial periwound is protected with2barrier ringsto attempt to maintain a seal.Applied one piece black foam and bridged to hip to reduce pressure.Pt denies c/o pain for the procedure. [place] team will followand change Vac dressing Q M/W/F at 0800 before patient is up and out of bed for therapy. Julien Girt MSN, RN, [place], [place], [place]

## 2018-08-27 NOTE — Progress Notes (Signed)
[place] PHYSICAL MEDICINE & REHABILITATION PROGRESS NOTE   Subjective/Complaints: Refusing protein supplements, prostat burns, , too full for ensure  Like max protein  ROS: Patient denies CP, SOB, N/V/D Objective:   No results found. No results for input(s): WBC, HGB, HCT, PLT in the last 72 hours. No results for input(s): NA, K, CL, CO2, GLUCOSE, BUN, CREATININE, CALCIUM in the last 72 hours.  Intake/Output Summary (Last 24 hours) at 08/27/2018 0713 Last data filed at 08/27/2018 0346 Gross per 24 hour  Intake 380 ml  Output 900 ml  Net -520 ml     Physical Exam: Vital Signs Blood pressure 101/61, pulse 68, temperature 97.8 F (36.6 C), temperature source Oral, resp. rate 17, height 5\' 11"  (1.803 m), weight 91.2 kg, last menstrual period 10/22/2016, SpO2 98 %. Constitutional: No distress . Vital signs reviewed. HEENT: EOMI, Left TM normal, ext auditory canal normal  Neck: supple Cardiovascular: RRR without murmur. No JVD    Respiratory: CTA Bilaterally without wheezes or rales. Normal effort    GI: BS +, non-tender, non-distended  Musc: Lower extremity edema Neurological: Somnolent Follows full commands.  Motor: Bilateral upper extremities: Grossly 4/5 proximal to distal, stable.  Bilateral lower extremities: 0/5 proximal to distal, stable, extensor tone 2/4 Sensation absent to light touch Skin: Skin iswarm. She isnot diaphoretic. Buttock wounds both dressed with vac in place, sealed Psychiatric: She has a normal mood and affect. Herbehavior is normal.    Assessment/Plan: 1. Functional deficits secondary to debility related to decubitus ulcers/UTI in setting of thoracic paraplegia which require 3+ hours per day of interdisciplinary therapy in a comprehensive inpatient rehab setting.  Physiatrist is providing close team supervision and 24 hour management of active medical problems listed below.  Physiatrist and rehab team continue to assess barriers to  discharge/monitor patient progress toward functional and medical goals  Care Tool:  Bathing    Body parts bathed by patient: Right arm, Left arm, Chest, Abdomen, Face, Right upper leg, Left upper leg   Body parts bathed by helper: Right lower leg, Left lower leg, Buttocks, Front perineal area Body parts n/a: Buttocks(Sacral wounds with dressings)   Bathing assist Assist Level: Moderate Assistance - Patient 50 - 74%     Upper Body Dressing/Undressing Upper body dressing   What is the patient wearing?: Pull over shirt    Upper body assist Assist Level: Minimal Assistance - Patient > 75%    Lower Body Dressing/Undressing Lower body dressing      What is the patient wearing?: Incontinence brief, Pants     Lower body assist Assist for lower body dressing: Moderate Assistance - Patient 50 - 74%     Toileting Toileting Toileting Activity did not occur Landscape architect and hygiene only): Safety/medical concerns  Toileting assist Assist for toileting: Dependent - Patient 0%     Transfers Chair/bed transfer  Transfers assist     Chair/bed transfer assist level: Maximal Assistance - Patient 25 - 49%     Locomotion Ambulation   Ambulation assist   Ambulation activity did not occur: N/A          Walk 10 feet activity   Assist  Walk 10 feet activity did not occur: N/A        Walk 50 feet activity   Assist Walk 50 feet with 2 turns activity did not occur: N/A         Walk 150 feet activity   Assist Walk 150 feet activity did not occur: N/A  Walk 10 feet on uneven surface  activity   Assist Walk 10 feet on uneven surfaces activity did not occur: N/A         Wheelchair     Assist Will patient use wheelchair at discharge?: Yes Type of Wheelchair: Power    Wheelchair assist level: Independent Max wheelchair distance: 150'    Wheelchair 50 feet with 2 turns activity    Assist        Assist Level: Independent    Wheelchair 150 feet activity     Assist     Assist Level: Independent    Medical Problem List and Plan: 1.Debility/paraplegiasecondary to sacral decubitus status post debridement with history of metastaticHurthlecancer followed by Dr.Kale Continue CIR  --PT, OT  2. Antithrombotics: -DVT/anticoagulation:Chronic Xarelto for history of pulmonary emboli -antiplatelet therapy: N/A 3. Pain Management:Baclofen 10 mg (reduced dose) 3 times daily, oxycodone as needed     4. Mood:Zoloft 50 mg daily- appreciate Neuropsych note  -antipsychotic agents: N/A 5. Neuropsych: This patientiscapable of making decisions on herown behalf. 6. Skin/Wound Care:Continuesantyl andhydrotherapy per PT   -wound vac with change M/W/F per [place]  7. Fluids/Electrolytes/Nutrition: encourage PO 8.Acute on chronic anemia.   Hemoglobin holding at 8.9 6/1  -fe++ supp 9.Neurogenic bowel and bladderas well as Klebsiella UTI.   Establish bowel program in evening, getting closer  -I/O cath schedule   -Completed course of Rocephin 10. Hypothyroidism. Synthroid 11. Hypotension:  Vitals:   08/26/18 1415 08/26/18 1953  BP: (!) 94/58 101/61  Pulse: 73 68  Resp: 18 17  Temp: 97.8 F (36.6 C) 97.8 F (36.6 C)  SpO2: 98% 98%    Strong autonomic component  -most likely cause is her worsening myelopathy   -decreased baclofen to 10mg  yesterday without much of a change in bp's   -holding lyrica  -  thyroid studies reviewed and WNL  -encourage fluids  -begin florinef trial  Echo   from 2019 EF of 55 to 60%      12. .  CKD stage III- avoid nephrotoxic meds   Creatinine 1.25 6/1  LOS: 10 days A FACE TO FACE EVALUATION WAS PERFORMED  [name] 08/27/2018, 7:13 AM

## 2018-08-27 NOTE — Progress Notes (Signed)
Physical Therapy Session Note  Patient Details  Name: Sarah [name] MRN: [id number] Date of Birth: [birth year]  Today's Date: 08/27/2018 PT Individual Time: 1100-1200 PT Individual Time Calculation (min): 60 min   Short Term Goals: Week 2:  PT Short Term Goal 1 (Week 2): =LTG due to ELOS  Skilled Therapeutic Interventions/Progress Updates:    Pt received seated in power chair in room, agreeable to PT session. Pt reports soreness and tightness in L upper trap region, pain not rated. STM to L upper trap and use of hot pack at end of session for pain management. Power w/c mobility mod I with v/c pressure relief needed during session. Slide board transfer w/c to/from mat table with max A. Sit to semi-reclined on mat table on therapy wedge with mod A for BLE management. Semi-reclined to long-sitting with total A.  Semi-reclined thoracic extension stretch on therapy ball and trunk flexion/HS stretch with max to total A for position change. Long-sit to sitting EOM with assist x 2, one person to assist with trunk control and 2nd person to assist with BLE management. Nursing request pt return to bed at end of session for I/O cath. Slide board transfer w/c to bed with mod A. Sit to supine mod A for BLE management. Rolling L/R with min A and use of bedrails for bedclothes adjustment. Pt left semi-reclined in bed with needs in reach at end of session.  Therapy Documentation Precautions:  Precautions Precautions: Fall Precaution Comments: stage 4 sacral wound Restrictions Weight Bearing Restrictions: No    Therapy/Group: Individual Therapy   Excell Seltzer, PT, DPT  08/27/2018, 12:11 PM

## 2018-08-27 NOTE — Progress Notes (Signed)
Patient continues to refuse Pro-stat as well as Ensure plus. She states that the prostat burns her throat and she does not want to take it, Pt also states that she is too full at night to drink the ensure plus and wants to drink it in the morning. Discussed with the patient the importance of protein and adequate nutrition.

## 2018-08-27 NOTE — Progress Notes (Signed)
Occupational Therapy Session Note  Patient Details  Name: Sarah [name] MRN: [id number] Date of Birth: 1966-01-26  Today's Date: 08/27/2018 OT Individual Time: 0850-1000 and 1300-1400 OT Individual Time Calculation (min): 70 min and 60 min   Short Term Goals: Week 2:  OT Short Term Goal 1 (Week 2): STG=LTG due to LOS  Skilled Therapeutic Interventions/Progress Updates:    Session One: Pt seen for OT ADL bathing/dressing session. Pt in supine upon arrival, agreeable to tx session. Complaints of unrated L UE and breat pain, RN made aware and administered pain medications.  Pt completed LB bathing/dressing from bed level, min A to manage R LE in order to obtain modifed figure four position in order to wash LE and use reacher to thread on pants with assist of reacher and therapist assisting with clothing management and maintaining LE positioning. Mod-max A required for management of LE and maintaining position while therapist washed LE and donned pants in same manner as described above. Upon rolling to place brief and pull pants up, pt noted to be bleeding from rectal area. RN made aware and determined it to be due to hemorrhoids and clearance to cont with therapy. From side-lying position, pt able to reach to pull pants up. Max A to transfer to sitting EOB and completed mod-max A sliding board transfer to power w/c.  Therapist assisted with washing pt's hair at sink, pt able to maintain anterior weightshift while hair care provided. Pt dried and brushed hair with set-up using B UEs and demonstrating improved functional strength in B UEs. UB bathing/dressing completed with set-up and assist to pull down shirt in back.  Pt left seated in power w/c at end of session, all needs in reach. Reviewed need for pressure relief schedule, pt voiced understanding.   Session Two: Pt seen for OT session focusing on mobility and body control in prep for ADL tasks. Pt in supine upon arrival eating lunch, agreeable to  tx session and denying pain.  Pt transferred to sitting EOB with max A, and max A sliding board transfer to power w/c. She managed w/c throughout unit mod I. Mod A sliding board transfer to EOM. Completed x3 sets of 10 lateral leans on each direction. Rest breaks required btwn trials and guarding assist for balance with VCs for technique. Education provided on functional use of lateral leans and importance of anterior weight shift.  Completed x2 sets of 10 anterior/posterior leans from EOM, with CGA. Reclined rest breaks required btwn trials. Mod A slide board transfer to return to power w/c.  Pt returned to room at end of session, left with all needs in reach. Reviewed need for pressure relief.    Therapy Documentation Precautions:  Precautions Precautions: Fall Precaution Comments: stage 4 sacral wound Restrictions Weight Bearing Restrictions: No   Therapy/Group: Individual Therapy  [name] L 08/27/2018, 6:56 AM

## 2018-08-28 ENCOUNTER — Inpatient Hospital Stay (HOSPITAL_COMMUNITY): Payer: Medicare Other | Admitting: Occupational Therapy

## 2018-08-28 NOTE — Progress Notes (Signed)
Sarah [name] is a 53 y.o. female admitted for CIR with debility/paraplegia/status post debridement of sacral decubitus.  Patient has history of metastatic Hurthle cell cancer  Past Medical History:  Diagnosis Date  . Anxiety   . Cancer [facility])    FNA of thyroid positive for onconytic/hurthle cell carcinoma  . Chronic back pain   . Complication of anesthesia    Nausea and vomiting  . DDD (degenerative disc disease), cervical   . Depression   . DJD (degenerative joint disease)   . Family history of adverse reaction to anesthesia    MOTHER HAS NAUSEA   . GERD (gastroesophageal reflux disease)   . History of blood transfusion    08/2017  . History of rectal fissure   . HIT (heparin-induced thrombocytopenia) ([place])   . Hypertension    05/27/2018- not current- runs low  . Hypothyroidism   . Obesity   . Paraplegia at T4 level ([place])   . Pneumonia 03/2018  . Pulmonary embolus, right ([place]) 2015  . Rotator cuff tendonitis right     Subjective: No new complaints. No new problems. Slept well. Feeling OK.  Objective: Vital signs in last 24 hours: Temp:  [97.7 F (36.5 C)-98.6 F (37 C)] 98.6 F (37 C) (06/06 0451) Pulse Rate:  [65-71] 68 (06/06 0451) Resp:  [14-20] 14 (06/06 0451) BP: (102-131)/(61-79) 102/61 (06/06 0451) SpO2:  [98 %-100 %] 98 % (06/06 0451) Weight:  [95.7 kg] 95.7 kg (06/06 0451) Weight change:  Last BM Date: 08/28/18  Intake/Output from previous day: 06/05 0701 - 06/06 0700 In: 460 [P.O.:460] Out: 450 [Urine:450]  Patient Vitals for the past 24 hrs:  BP Temp Temp src Pulse Resp SpO2 Weight  08/28/18 0451 102/61 98.6 F (37 C) Oral 68 14 98 % 95.7 kg  08/27/18 2036 103/73 97.7 F (36.5 C) Oral 65 16 100 % -  08/27/18 1426 131/79 98.5 F (36.9 C) Oral 71 20 98 % -     Physical Exam General: No apparent distress alert HEENT: not dry Lungs: Normal effort. Lungs clear to auscultation, no crackles or wheezes. Cardiovascular: Regular rate and rhythm, no  edema Abdomen: S/NT/ND; BS(+) Musculoskeletal:  unchanged Neurological: No new neurological deficits with lower extremity paraplegia Wounds: Sacral/buttock wounds dressed Skin: clear  Mental state: Alert, oriented, cooperative    Lab Results: BMET    Component Value Date/Time   NA 132 (L) 08/23/2018 0950   NA 139 04/08/2018 1503   NA 143 01/02/2017 1002   K 3.7 08/23/2018 0950   K 4.0 01/02/2017 1002   CL 97 (L) 08/23/2018 0950   CO2 27 08/23/2018 0950   CO2 25 01/02/2017 1002   GLUCOSE 101 (H) 08/23/2018 0950   GLUCOSE 87 01/02/2017 1002   BUN 11 08/23/2018 0950   BUN 13 04/08/2018 1503   BUN 11.9 01/02/2017 1002   CREATININE 1.25 (H) 08/23/2018 0950   CREATININE 1.09 (H) 07/21/2018 1004   CREATININE 0.9 01/02/2017 1002   CALCIUM 7.4 (L) 08/23/2018 0950   CALCIUM 7.2 (L) 09/15/2017 0635   CALCIUM 8.8 01/02/2017 1002   GFRNONAA 49 (L) 08/23/2018 0950   GFRNONAA 58 (L) 07/21/2018 1004   GFRNONAA 71 06/07/2015 1523   GFRAA 57 (L) 08/23/2018 0950   GFRAA >60 07/21/2018 1004   GFRAA 82 06/07/2015 1523   CBC    Component Value Date/Time   WBC 5.9 08/23/2018 0950   RBC 3.06 (L) 08/23/2018 0950   HGB 8.9 (L) 08/23/2018 0950   HGB  9.7 (L) 04/08/2018 1503   HGB 12.7 01/02/2017 1002   HCT 27.4 (L) 08/23/2018 0950   HCT 29.6 (L) 04/08/2018 1503   HCT 37.4 01/02/2017 1002   PLT 170 08/23/2018 0950   PLT 191 04/08/2018 1503   MCV 89.5 08/23/2018 0950   MCV 93 04/08/2018 1503   MCV 87.0 01/02/2017 1002   MCH 29.1 08/23/2018 0950   MCHC 32.5 08/23/2018 0950   RDW 15.9 (H) 08/23/2018 0950   RDW 17.9 (H) 04/08/2018 1503   RDW 14.6 (H) 01/02/2017 1002   LYMPHSABS 0.7 08/23/2018 0950   LYMPHSABS 0.7 04/08/2018 1503   LYMPHSABS 1.0 01/02/2017 1002   MONOABS 0.3 08/23/2018 0950   MONOABS 0.2 01/02/2017 1002   EOSABS 0.1 08/23/2018 0950   EOSABS 0.1 04/08/2018 1503   BASOSABS 0.0 08/23/2018 0950   BASOSABS 0.0 04/08/2018 1503   BASOSABS 0.0 01/02/2017 1002     Medications: I have reviewed the patient's current medications.  Assessment/Plan:  Functional deficits secondary to general debility in the setting of decubiti/UTI/thoracic paraplegia.  Continue CIR History of Hurthle cell carcinoma Antithrombotics/history of pulmonary embolism.  Continue chronic Xarelto Skin/wound care.  Continue present care with hydrotherapy Neurogenic bowel and bladder Hypothyroidism.  Continue levothyroxine supplement Hypotension/autonomic dysfunction.  Florinef trial initiated    Length of stay, days: 11  Marletta Lor , MD 08/28/2018, 9:53 AM

## 2018-08-28 NOTE — Plan of Care (Signed)
  Problem: Consults Goal: RH SPINAL CORD INJURY PATIENT EDUCATION Description  See Patient Education module for education specifics.  Outcome: Progressing Goal: Skin Care Protocol Initiated - if Braden Score 18 or less Description If consults are not indicated, leave blank or document N/A Outcome: Progressing Goal: Nutrition Consult-if indicated Outcome: Progressing Goal: Diabetes Guidelines if Diabetic/Glucose > 140 Description If diabetic or lab glucose is > 140 mg/dl - Initiate Diabetes/Hyperglycemia Guidelines & Document Interventions  Outcome: Progressing   Problem: SCI BOWEL ELIMINATION Goal: RH STG MANAGE BOWEL WITH ASSISTANCE Description STG Manage Bowel with min Assistance.  Outcome: Progressing Goal: RH STG SCI MANAGE BOWEL WITH MEDICATION WITH ASSISTANCE Description STG SCI Manage bowel with medication with min assistance.  Outcome: Progressing Goal: RH STG MANAGE BOWEL W/EQUIPMENT W/ASSISTANCE Description STG Manage Bowel With Equipment With min Assistance  Outcome: Progressing Goal: RH STG SCI MANAGE BOWEL PROGRAM W/ASSIST OR AS APPROPRIATE Description STG SCI Manage bowel program with min assist  Outcome: Progressing Goal: RH OTHER STG BOWEL ELIMINATION GOALS W/ASSIST Description Other STG Bowel Elimination Goals With min Assistance.  Outcome: Progressing   Problem: SCI BLADDER ELIMINATION Goal: RH STG MANAGE BLADDER WITH ASSISTANCE Description STG Manage Bladder With min Assistance  Outcome: Progressing   Problem: RH SKIN INTEGRITY Goal: RH STG SKIN FREE OF INFECTION/BREAKDOWN Description Assess area of decubitus ulcer on buttocks daily and dressing change per order.   Outcome: Progressing Goal: RH STG MAINTAIN SKIN INTEGRITY WITH ASSISTANCE Description STG Maintain Skin Integrity With mod Assistance.  Outcome: Progressing Goal: RH STG ABLE TO PERFORM INCISION/WOUND CARE W/ASSISTANCE Description STG Able To Perform Incision/Wound Care With Mod  Assistance.  Outcome: Progressing   Problem: RH SAFETY Goal: RH STG ADHERE TO SAFETY PRECAUTIONS W/ASSISTANCE/DEVICE Description STG Adhere to Safety Precautions With min Assistance/Device.  Outcome: Progressing Goal: RH STG DECREASED RISK OF FALL WITH ASSISTANCE Description STG Decreased Risk of Fall With min Assistance.  Outcome: Progressing   Problem: RH PAIN MANAGEMENT Goal: RH STG PAIN MANAGED AT OR BELOW PT'S PAIN GOAL Description Pain level below 4 on scale of 0-10.  Outcome: Progressing   Problem: RH KNOWLEDGE DEFICIT SCI Goal: RH STG INCREASE KNOWLEDGE OF SELF CARE AFTER SCI Description Pt will demonstrate at least 2 methods to reduce pressure on sacrum.   Outcome: Progressing   Problem: RH Pre-functional/Other (Specify) Goal: RH LTG Pre-functional (Specify) Outcome: Progressing

## 2018-08-28 NOTE — Progress Notes (Signed)
Occupational Therapy Session Note  Patient Details  Name: Sarah [name] MRN: [id number] Date of Birth: 1965-04-21  Today's Date: 08/28/2018 OT Individual Time: 5027-7412 & 1100-1155 OT Individual Time Calculation (min): 65 min & 71min   Short Term Goals: Week 2:  OT Short Term Goal 1 (Week 2): STG=LTG due to LOS  Skilled Therapeutic Interventions/Progress Updates:    session 1:  Patient in bed and ready for therapy - LB sponge bath and dressing completed at bed level with assistance to wash bottom and for thoroughness with bilateral feet.  LB dressing mod A, footwear dependent.  Supine to SSP with max A, SB transfer bed to w/c with max assist with focus on reduction of sheering.  UB bathing/dressing and grooming tasks completed at w/c level with set up/Supervision.  Patient remained in the w/c at close of session with call bell and tray table in reach  Session 2:  Patient in w/c - states that she has completed weight shifts during her time in the w/c.  Patient able to navigate environment in power chair.  Completed trunk and core mobility activities with focus on head/neck and scapula positioning.  UB ergometer 2x5 minutes.  SB transfer w/c to bed with max A, max A for SSP to supine.  Patient able to direct care t/o both sessions, reviewed home exercise and stretching plan with emphasis on consistency.  She has call bell and tray table in place at close of session and states that she feels better.    Therapy Documentation Precautions:  Precautions Precautions: Fall Precaution Comments: stage 4 sacral wound Restrictions Weight Bearing Restrictions: No General:   Vital Signs:   Pain: Pain Assessment Pain Scale: 0-10 Pain Score: 0-No pain Faces Pain Scale: No hurt Other Treatments:     Therapy/Group: Individual Therapy  Carlos Levering 08/28/2018, 11:59 AM

## 2018-08-29 ENCOUNTER — Inpatient Hospital Stay (HOSPITAL_COMMUNITY): Payer: Medicare Other

## 2018-08-29 NOTE — Progress Notes (Signed)
Vac canister full- changed.  Brita Romp, RN

## 2018-08-29 NOTE — Progress Notes (Signed)
Sarah [name] is a 53 y.o. female admitted for CIR with general debility in the setting of paraplegia and debridement of sacral decubitus.  Patient has history of Hurthle cell carcinoma  Past Medical History:  Diagnosis Date  . Anxiety   . Cancer [facility])    FNA of thyroid positive for onconytic/hurthle cell carcinoma  . Chronic back pain   . Complication of anesthesia    Nausea and vomiting  . DDD (degenerative disc disease), cervical   . Depression   . DJD (degenerative joint disease)   . Family history of adverse reaction to anesthesia    MOTHER HAS NAUSEA   . GERD (gastroesophageal reflux disease)   . History of blood transfusion    08/2017  . History of rectal fissure   . HIT (heparin-induced thrombocytopenia) ([place])   . Hypertension    05/27/2018- not current- runs low  . Hypothyroidism   . Obesity   . Paraplegia at T4 level ([place])   . Pneumonia 03/2018  . Pulmonary embolus, right ([facility]) 2015  . Rotator cuff tendonitis right     Subjective: No new complaints. No new problems. Slept well.  Remains symptom-free  Objective: Vital signs in last 24 hours: Temp:  [98 F (36.7 C)-98.2 F (36.8 C)] 98.2 F (36.8 C) (06/07 0557) Pulse Rate:  [67-78] 67 (06/07 0557) Resp:  [14-16] 14 (06/07 0557) BP: (96-120)/(59-68) 120/68 (06/07 0557) SpO2:  [99 %] 99 % (06/07 0557) Weight change:  Last BM Date: 08/28/18  Intake/Output from previous day: 06/06 0701 - 06/07 0700 In: 960 [P.O.:960] Out: 185 [Urine:185]   Patient Vitals for the past 24 hrs:  BP Temp Temp src Pulse Resp SpO2  08/29/18 0557 120/68 98.2 F (36.8 C) Oral 67 14 99 %  08/28/18 2000 115/65 - - 78 16 99 %  08/28/18 1449 (!) 96/59 98 F (36.7 C) Oral 74 16 99 %     Physical Exam General: No apparent distress alert HEENT: Unremarkable Lungs: Normal effort. Lungs clear to auscultation, no crackles or wheezes. Cardiovascular: Regular rate and rhythm, no edema Abdomen: S/NT/ND; BS(+) Musculoskeletal:   unchanged Neurological: No new neurological deficits with lower extremity paraplegia Wounds: Sacral/buttock wounds dressed Skin: clear   Mental state: Alert, oriented, cooperative    Lab Results: BMET    Component Value Date/Time   NA 132 (L) 08/23/2018 0950   NA 139 04/08/2018 1503   NA 143 01/02/2017 1002   K 3.7 08/23/2018 0950   K 4.0 01/02/2017 1002   CL 97 (L) 08/23/2018 0950   CO2 27 08/23/2018 0950   CO2 25 01/02/2017 1002   GLUCOSE 101 (H) 08/23/2018 0950   GLUCOSE 87 01/02/2017 1002   BUN 11 08/23/2018 0950   BUN 13 04/08/2018 1503   BUN 11.9 01/02/2017 1002   CREATININE 1.25 (H) 08/23/2018 0950   CREATININE 1.09 (H) 07/21/2018 1004   CREATININE 0.9 01/02/2017 1002   CALCIUM 7.4 (L) 08/23/2018 0950   CALCIUM 7.2 (L) 09/15/2017 0635   CALCIUM 8.8 01/02/2017 1002   GFRNONAA 49 (L) 08/23/2018 0950   GFRNONAA 58 (L) 07/21/2018 1004   GFRNONAA 71 06/07/2015 1523   GFRAA 57 (L) 08/23/2018 0950   GFRAA >60 07/21/2018 1004   GFRAA 82 06/07/2015 1523   CBC    Component Value Date/Time   WBC 5.9 08/23/2018 0950   RBC 3.06 (L) 08/23/2018 0950   HGB 8.9 (L) 08/23/2018 0950   HGB 9.7 (L) 04/08/2018 1503   HGB 12.7 01/02/2017  1002   HCT 27.4 (L) 08/23/2018 0950   HCT 29.6 (L) 04/08/2018 1503   HCT 37.4 01/02/2017 1002   PLT 170 08/23/2018 0950   PLT 191 04/08/2018 1503   MCV 89.5 08/23/2018 0950   MCV 93 04/08/2018 1503   MCV 87.0 01/02/2017 1002   MCH 29.1 08/23/2018 0950   MCHC 32.5 08/23/2018 0950   RDW 15.9 (H) 08/23/2018 0950   RDW 17.9 (H) 04/08/2018 1503   RDW 14.6 (H) 01/02/2017 1002   LYMPHSABS 0.7 08/23/2018 0950   LYMPHSABS 0.7 04/08/2018 1503   LYMPHSABS 1.0 01/02/2017 1002   MONOABS 0.3 08/23/2018 0950   MONOABS 0.2 01/02/2017 1002   EOSABS 0.1 08/23/2018 0950   EOSABS 0.1 04/08/2018 1503   BASOSABS 0.0 08/23/2018 0950   BASOSABS 0.0 04/08/2018 1503   BASOSABS 0.0 01/02/2017 1002     Medications: I have reviewed the patient's current  medications.  Assessment/Plan:  Functional deficits secondary to general debility in the setting of sacral decubitus UTI and thoracic paraplegia.  Continue CIR DVT prophylaxis/history of pulmonary embolism.  Continue Xarelto History of Hurthle cell carcinoma.  Follow-up oncology Skin/wound care.  Continue present therapy including hydrotherapy Hypothyroidism continue supplement History of hypotension/autonomic dysfunction.  Blood pressure improved over the past 2 days.  Florinef added   Length of stay, days: 12  Marletta Lor , MD 08/29/2018, 8:34 AM

## 2018-08-29 NOTE — Progress Notes (Addendum)
Slept good thus far. BLE with spasms, especially when repositioned legs or turning patient. Bilateral heels/feet with foam dressings and prevalon boots. Wound VAC in place to right sacral wound. Dressing in place to left sacral wound. Inter dry dressing in place to abdominal folds.  Encouraged patient to use I. S. Cough very weak. Refused prostat. At 2310, void incontinent, patient unaware. PVR=77cc's. At 0230, incontinent B & B, PVR=211cc's, small, loose stool.  1 PRN oxy IR given at 0052, for complaint of back pain. Sarah [name] A

## 2018-08-29 NOTE — Progress Notes (Signed)
Occupational Therapy Session Note  Patient Details  Name: Sarah [name] MRN: [id number] Date of Birth: 05/15/1965  Today's Date: 08/29/2018 OT Individual Time: 1115-1217 OT Individual Time Calculation (min): 62 min    Short Term Goals: Week 1:  OT Short Term Goal 1 (Week 1): Pt will tolerate sitting EOB for 5 minutes with CGA in prep for functional task OT Short Term Goal 1 - Progress (Week 1): Met OT Short Term Goal 2 (Week 1): Pt will complete sliding board transfer EOB>w/c with mod A OT Short Term Goal 2 - Progress (Week 1): Progressing toward goal OT Short Term Goal 3 (Week 1): Pt will thread one LE into pants with max A using AE PRN from bed level OT Short Term Goal 3 - Progress (Week 1): Met  Skilled Therapeutic Interventions/Progress Updates:    Pt resting in bed upon arrival and ready for bathing/dressing and transfer to w/c.  OT intervention with focus on bed mobility, BADL training, activity tolerance, directing care, and safety awareness to increase independence with BADLs. Pt required assistance manipulating BLE for bathing/dressing tasks but able to bathe BLE with therapist assisting with placement and maintaining position.  Pt initiated threading pants with use of reacher but required assistance pulling up 2/2 tightness of pants over BLE.  Pt assisted with pulling pants over R hip but required assistance with pulling over L hip.  Pt required max A for supine>sit EOB but maintained sitting balance in preparation for slide board transfer to w/c with mod A.  Pt completed UB bathing/dressing w/c level at sink.  Pt remained seated in w/c with all needs within reach.   Therapy Documentation Precautions:  Precautions Precautions: Fall Precaution Comments: stage 4 sacral wound Restrictions Weight Bearing Restrictions: No    Pain:  Pt denies pain this morning (premedicated)   Therapy/Group: Individual Therapy  Lanier, Thomas Chappell 08/29/2018, 12:19 PM  

## 2018-08-29 NOTE — Plan of Care (Signed)
  Problem: Consults Goal: RH SPINAL CORD INJURY PATIENT EDUCATION Description  See Patient Education module for education specifics.  Outcome: Progressing Goal: Skin Care Protocol Initiated - if Braden Score 18 or less Description If consults are not indicated, leave blank or document N/A Outcome: Progressing Goal: Nutrition Consult-if indicated Outcome: Progressing Goal: Diabetes Guidelines if Diabetic/Glucose > 140 Description If diabetic or lab glucose is > 140 mg/dl - Initiate Diabetes/Hyperglycemia Guidelines & Document Interventions  Outcome: Progressing   Problem: SCI BOWEL ELIMINATION Goal: RH STG MANAGE BOWEL WITH ASSISTANCE Description STG Manage Bowel with total Assistance.   Outcome: Progressing Goal: RH STG SCI MANAGE BOWEL WITH MEDICATION WITH ASSISTANCE Description STG SCI Manage bowel with medication with min assistance.  Outcome: Progressing Goal: RH STG MANAGE BOWEL W/EQUIPMENT W/ASSISTANCE Description STG Manage Bowel With Equipment With total Assistance   Outcome: Progressing Goal: RH STG SCI MANAGE BOWEL PROGRAM W/ASSIST OR AS APPROPRIATE Description STG SCI Manage bowel program with total assist   Outcome: Progressing Goal: RH OTHER STG BOWEL ELIMINATION GOALS W/ASSIST Description Other STG Bowel Elimination Goals With min Assistance.  Outcome: Progressing   Problem: SCI BLADDER ELIMINATION Goal: RH STG MANAGE BLADDER WITH ASSISTANCE Description STG Manage Bladder With min Assistance  Outcome: Progressing   Problem: RH SKIN INTEGRITY Goal: RH STG SKIN FREE OF INFECTION/BREAKDOWN Description Assess area of decubitus ulcer on buttocks daily and dressing change per order.   Outcome: Progressing Goal: RH STG MAINTAIN SKIN INTEGRITY WITH ASSISTANCE Description STG Maintain Skin Integrity With mod Assistance.  Outcome: Progressing Goal: RH STG ABLE TO PERFORM INCISION/WOUND CARE W/ASSISTANCE Description STG Able To Perform Incision/Wound Care  With Mod Assistance.  Outcome: Progressing   Problem: RH SAFETY Goal: RH STG ADHERE TO SAFETY PRECAUTIONS W/ASSISTANCE/DEVICE Description STG Adhere to Safety Precautions With min Assistance/Device.  Outcome: Progressing Goal: RH STG DECREASED RISK OF FALL WITH ASSISTANCE Description STG Decreased Risk of Fall With min Assistance.  Outcome: Progressing   Problem: RH PAIN MANAGEMENT Goal: RH STG PAIN MANAGED AT OR BELOW PT'S PAIN GOAL Description Pain level below 4 on scale of 0-10.  Outcome: Progressing   Problem: RH KNOWLEDGE DEFICIT SCI Goal: RH STG INCREASE KNOWLEDGE OF SELF CARE AFTER SCI Description Pt will demonstrate at least 2 methods to reduce pressure on sacrum.   Outcome: Progressing   Problem: RH Pre-functional/Other (Specify) Goal: RH LTG Pre-functional (Specify) Outcome: Progressing

## 2018-08-30 ENCOUNTER — Inpatient Hospital Stay (HOSPITAL_COMMUNITY): Payer: Medicare Other | Admitting: Physical Therapy

## 2018-08-30 ENCOUNTER — Inpatient Hospital Stay (HOSPITAL_COMMUNITY): Payer: Medicare Other | Admitting: Occupational Therapy

## 2018-08-30 MED ORDER — ENSURE MAX PROTEIN PO LIQD
11.0000 [oz_av] | Freq: Two times a day (BID) | ORAL | Status: DC
  Administered 2018-08-31 – 2018-09-03 (×3): 11 [oz_av] via ORAL
  Filled 2018-08-30 (×9): qty 330

## 2018-08-30 NOTE — Progress Notes (Signed)
Physical Therapy Session Note  Patient Details  Name: Sarah [name] MRN: [id number] Date of Birth: Jul 07, 1965  Today's Date: 08/30/2018 PT Individual Time: 1115-1200; 1500-1530 PT Individual Time Calculation (min): 45 min and 30 min  Short Term Goals: Week 2:  PT Short Term Goal 1 (Week 2): =LTG due to ELOS  Skilled Therapeutic Interventions/Progress Updates:    Session 1: Pt received seated in power w/c in room, agreeable to PT session. No complaints of pain. Pt appears to be in better spirits this AM and is more engaged in therapy session. Pt is able to set up power w/c for safe transfer to bed mod I. Slide board transfer w/c to bed with mod A. Sit to supine mod A for BLE management. Rolling L/R with min A and use of bedrails for bedclothes placement. Semi-reclined to long-sitting with use of HOB elevation feature and B bedrails as well as mod A to attain long-sitting position. Focus on decreased BUE support on bedrails while in long-sitting, maintaining upright sitting balance, as well as B HS stretch. Pt is able to tolerate this position 4 x 2-3 min before onset of fatigue. Focus on eccentric control when returning to semi-reclined position. Supine to sitting EOB with max A for BLE management and trunk control. Slide board transfer bed to w/c with mod A. Pt left seated in power w/c in room with needs in reach at end of session.  Session 2: Pt received seated in power w/c in room, reports feeling nauseous and RN is checking on anti-nausea medication. Pt is agreeable to therapy session in her room despite nausea, no complaints of pain. Seated BUE and core strengthening 3# therex: bicep curls, tricep ext, pronation/supination, horiz add, punch-outs, lifts; AAROM BUE therex: shoulder flex, shoulder abd x 10 reps each. Pt left seated in power w/c in room with needs in reach at end of session. Per pt report she has been performing pressure relief throughout the day on a schedule.  Therapy  Documentation Precautions:  Precautions Precautions: Fall Precaution Comments: stage 4 sacral wound Restrictions Weight Bearing Restrictions: No    Therapy/Group: Individual Therapy   Excell Seltzer, PT, DPT  08/30/2018, 12:09 PM

## 2018-08-30 NOTE — Consult Note (Signed)
[place] Nurse wound follow up Patient receiving care in [facility] 4W13. Assisted with NPWT dressing change by RN. 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  One piece of black foam removed, two pieces of black foam placed. Wound bed: beefy red granulation tissue Drainage (amount, consistency, odor) serosanginous in canister Periwound: intact. Dressing procedure/placement/frequency: As above. Two barrier rings placed along the margins at the inferior and along the anus/labia border. Dressing bridged to right hip. Immediate seal obtained. Additional two day supply in room. Val Riles, RN, MSN, CWOCN, CNS-BC, pager [phone]

## 2018-08-30 NOTE — Progress Notes (Signed)
[facility] PHYSICAL MEDICINE & REHABILITATION PROGRESS NOTE   Subjective/Complaints: No new complaints. Says that bp's have been better. Pain reasonably controlled  ROS: Patient denies fever, rash, sore throat, blurred vision, nausea, vomiting, diarrhea, cough, shortness of breath or chest pain, joint or back pain, headache, or mood change.    Objective:   No results found. No results for input(s): WBC, HGB, HCT, PLT in the last 72 hours. No results for input(s): NA, K, CL, CO2, GLUCOSE, BUN, CREATININE, CALCIUM in the last 72 hours.  Intake/Output Summary (Last 24 hours) at 08/30/2018 0951 Last data filed at 08/29/2018 1843 Gross per 24 hour  Intake 320 ml  Output -  Net 320 ml     Physical Exam: Vital Signs Blood pressure 122/76, pulse 75, temperature 98.3 F (36.8 C), temperature source Oral, resp. rate 16, height 5\' 11"  (1.803 m), weight 95.7 kg, last menstrual period 10/22/2016, SpO2 98 %. Constitutional: No distress . Vital signs reviewed. HEENT: EOMI, oral membranes moist Neck: supple Cardiovascular: RRR without murmur. No JVD    Respiratory: CTA Bilaterally without wheezes or rales. Normal effort    GI: BS +, non-tender, non-distended  Musc: Lower extremity edema Neurological: Somnolent Follows full commands.  Motor: Bilateral upper extremities: Grossly 4/5 proximal to distal, stable.  Bilateral lower extremities: 0/5 proximal to distal---no change.  extensor tone is 1/4 Sensation absent to light touch Skin: Skin iswarm. She isnot diaphoretic. Vac just replaced on buttocks Psychiatric: She has a normal mood and affect. Herbehavior is normal.    Assessment/Plan: 1. Functional deficits secondary to debility related to decubitus ulcers/UTI in setting of thoracic paraplegia which require 3+ hours per day of interdisciplinary therapy in a comprehensive inpatient rehab setting.  Physiatrist is providing close team supervision and 24 hour management of active  medical problems listed below.  Physiatrist and rehab team continue to assess barriers to discharge/monitor patient progress toward functional and medical goals  Care Tool:  Bathing    Body parts bathed by patient: Right arm, Left arm, Chest, Abdomen, Face, Right upper leg, Left upper leg, Right lower leg, Left lower leg, Front perineal area   Body parts bathed by helper: Buttocks Body parts n/a: Buttocks(Sacral wounds with dressings)   Bathing assist Assist Level: Minimal Assistance - Patient > 75%     Upper Body Dressing/Undressing Upper body dressing   What is the patient wearing?: Pull over shirt    Upper body assist Assist Level: Set up assist    Lower Body Dressing/Undressing Lower body dressing      What is the patient wearing?: Incontinence brief, Pants     Lower body assist Assist for lower body dressing: Moderate Assistance - Patient 50 - 74%     Toileting Toileting Toileting Activity did not occur Landscape architect and hygiene only): Safety/medical concerns  Toileting assist Assist for toileting: Dependent - Patient 0%     Transfers Chair/bed transfer  Transfers assist     Chair/bed transfer assist level: Maximal Assistance - Patient 25 - 49%     Locomotion Ambulation   Ambulation assist   Ambulation activity did not occur: N/A          Walk 10 feet activity   Assist  Walk 10 feet activity did not occur: N/A        Walk 50 feet activity   Assist Walk 50 feet with 2 turns activity did not occur: N/A         Walk 150 feet activity  Assist Walk 150 feet activity did not occur: N/A         Walk 10 feet on uneven surface  activity   Assist Walk 10 feet on uneven surfaces activity did not occur: N/A         Wheelchair     Assist Will patient use wheelchair at discharge?: Yes Type of Wheelchair: Power    Wheelchair assist level: Independent Max wheelchair distance: 150'    Wheelchair 50 feet with 2  turns activity    Assist        Assist Level: Independent   Wheelchair 150 feet activity     Assist     Assist Level: Independent    Medical Problem List and Plan: 1.Debility/paraplegiasecondary to sacral decubitus status post debridement with history of metastaticHurthlecancer followed by Dr.Kale -Continue CIR therapies including PT, OT  2. Antithrombotics: -DVT/anticoagulation:Chronic Xarelto for history of pulmonary emboli -antiplatelet therapy: N/A 3. Pain Management:Baclofen 10 mg (reduced dose) 3 times daily, oxycodone as needed     4. Mood:Zoloft 50 mg daily- appreciate Neuropsych note  -antipsychotic agents: N/A 5. Neuropsych: This patientiscapable of making decisions on herown behalf. 6. Skin/Wound Care:Continuesantyl andhydrotherapy per PT   -wound vac with change M/W/F per WOC --healing per notes 7. Fluids/Electrolytes/Nutrition: encourage PO 8.Acute on chronic anemia.   Hemoglobin holding at 8.9 6/1  -fe++ supp 9.Neurogenic bowel and bladderas well as Klebsiella UTI.   Establish bowel program in evening, getting closer  -I/O cath schedule   -Completed course of Rocephin 10. Hypothyroidism. Synthroid 11. Hypotension:  Vitals:   08/29/18 2003 08/30/18 0553  BP: 92/62 122/76  Pulse: 65 75  Resp: 16 16  Temp: 97.9 F (36.6 C) 98.3 F (36.8 C)  SpO2: 99% 98%    Strong autonomic component  -most likely cause is her worsening myelopathy   -decreased baclofen to 10mg    without much of a change in bp's   -holding lyrica  -  thyroid studies reviewed and WNL  -encourage fluids  -continue florinef trial  Echo   from 2019 EF of 55 to 60%      12. .  CKD stage III- avoid nephrotoxic meds   Creatinine 1.25 6/1  -recheck this week  LOS: 13 days [place] 08/30/2018, 9:51 AM

## 2018-08-30 NOTE — NC FL2 (Signed)
[place] LEVEL OF CARE SCREENING TOOL     IDENTIFICATION  Patient Name: Sarah [name] Birthdate: October 17, 1965 Sex: female Admission Date (Current Location): 08/17/2018  [facility] [place] Number:  Herbalist and Address:  [facility], [place] [address], [place], [place] [address]      Provider Number: [id number]  Attending Physician Name and Address:  [name], MD  Relative Name and Phone Number:       Current Level of Care: Other (Comment)(Acute Inpatient Rehab) Recommended Level of Care: [place] Prior Approval Number:    Date Approved/Denied:   PASRR Number: [id number]  Discharge Plan: SNF    Current Diagnoses: Patient Active Problem List   Diagnosis Date Noted  . CKD (chronic kidney disease), stage III ([place])   . Hypoalbuminemia due to protein-calorie malnutrition ([place])   . Hyperglycemia   . Nasal congestion   . Hypotension   . Acute on chronic anemia   . Febrile illness   . Bacterial skin infection 08/10/2018  . Eustachian tube disorder, left 06/30/2018  . Urinary tract infection 06/30/2018  . Malignant neoplasm ([place]) 06/01/2018  . Metastasis to brain ([place]) 06/01/2018  . Transaminitis 04/09/2018  . Hypoalbuminemia 04/09/2018  . Dyspnea   . Multifocal pneumonia   . Palliative care by specialist   . Goals of care, counseling/discussion   . DNR (do not resuscitate) discussion   . Fecal impaction ([place]) 02/06/2018  . HCAP (healthcare-associated pneumonia) 02/06/2018  . Anemia   . Chronic anticoagulation   . Sacral ulcer ([place]) 02/04/2018  . Fracture   . Acute lower UTI   . Myelopathy ([place]) 09/21/2017  . Muscle spasms of both lower extremities   . Closed fracture of shaft of left femur ([place])   . Acute blood loss anemia   . Hypothyroidism   . Nausea & vomiting 09/15/2017  . Closed fracture of left distal femur ([place]) 09/11/2017  . Paraplegia ([place]) 09/08/2017  . Obesity (BMI 30-39.9)  09/03/2017  . Status post surgery 09/03/2017  . Calculus of gallbladder without cholecystitis without obstruction   . Biliary dyskinesia   . Intractable nausea and vomiting 08/09/2017  . Thrombocytopenia ([place]) 08/09/2017  . Elevated bilirubin 08/09/2017  . AKI (acute kidney injury) ([place]) 08/09/2017  . Chest pain 01/26/2017  . Upper respiratory infection, viral 01/26/2017  . Counseling regarding advanced care planning and goals of care 01/02/2017  . Bilateral rotator cuff syndrome 02/12/2016  . E. coli UTI 08/01/2015  . Constipation due to neurogenic bowel 08/01/2015  . 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 Spastic paraparesis   . Neurogenic bladder   . History of pulmonary embolism   . Post-operative pain   . Metastatic cancer ([place])   . Steroid-induced hyperglycemia   . Depression   . Obstipation   . Thoracic spine tumor 07/06/2015  . 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 ([place]) 07/06/2015  . Spinal cord compression due to malignant neoplasm metastatic to spine ([place])   . Postoperative hypothyroidism   . Secondary malignant neoplasm of vertebral column ([place]) 03/08/2015  . Spasticity 09/19/2014  . Hypertension 05/01/2014  . Ingrown right big toenail 05/01/2014  . GERD (gastroesophageal reflux disease) 02/14/2014  . Dysphagia, pharyngoesophageal phase 02/06/2014  . Type 2 diabetes mellitus without complication ([place]) 25/85/2778  . Constipation   . Neurogenic bowel   . Paraplegia at T4 level ([place]) 01/20/2014  . Metastatic cancer to spine ([place]) 01/19/2014  . Rotator cuff tendonitis   .  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 Leg weakness, bilateral 12/13/2013    Orientation RESPIRATION BLADDER Height & Weight     Self, Time, Situation, Place  Normal Incontinent(occasional I/O caths needed if no emptying) Weight: 211 lb (95.7 kg) Height:  5\' 11"  (180.3 cm)  BEHAVIORAL SYMPTOMS/MOOD NEUROLOGICAL BOWEL NUTRITION  STATUS      Incontinent Diet(regular)  AMBULATORY STATUS COMMUNICATION OF NEEDS Skin   Total Care Verbally Wound Vac, PU Stage and Appropriate Care       PU Stage 4 Dressing: (wound VAC)               Personal Care Assistance Level of Assistance  Bathing, Dressing Bathing Assistance: Limited assistance   Dressing Assistance: Limited assistance     Functional Limitations Info             SPECIAL CARE FACTORS FREQUENCY  PT (By licensed PT), OT (By licensed OT), Bowel and bladder program     PT Frequency: 5x/wk OT Frequency: 5x/wk Bowel and Bladder Program Frequency: qd          Contractures Contractures Info: Not present    Additional Factors Info  Code Status, Allergies, Psychotropic, Insulin Sliding Scale(h/o MRSA; not on precautions currently.) Code Status Info: Full Allergies Info: see MAR Psychotropic Info: see MAR         Current Medications (08/30/2018):  This is the current hospital active medication list Current Facility-Administered Medications  Medication Dose Route Frequency Provider Last Rate Last Dose  . acetaminophen (TYLENOL) tablet 650 mg  650 mg Oral Q6H PRN Cathlyn Parsons, PA-C   650 mg at 08/24/18 1610   Or  . acetaminophen (TYLENOL) suppository 650 mg  650 mg Rectal Q6H PRN Angiulli, Lavon Paganini, PA-C      . baclofen (LIORESAL) tablet 10 mg  10 mg Oral TID [name], MD   10 mg at 08/30/18 9604  . bisacodyl (DULCOLAX) suppository 10 mg  10 mg Rectal Daily [name], MD   10 mg at 08/29/18 1959  . feeding supplement (PRO-STAT SUGAR FREE 64) liquid 30 mL  30 mL Oral BID Jamse Arn, MD   30 mL at 08/24/18 0824  . ferrous sulfate tablet 325 mg  325 mg Oral Q48H AngiulliLavon Paganini, PA-C   325 mg at 08/29/18 1519  . fludrocortisone (FLORINEF) tablet 0.1 mg  0.1 mg Oral Daily Alger Simons T, MD   0.1 mg at 08/30/18 5409  . fluticasone (FLONASE) 50 MCG/ACT nasal spray 2 spray  2 spray Each Nare Daily Cathlyn Parsons, PA-C    2 spray at 08/30/18 0813  . guaiFENesin (ROBITUSSIN) 100 MG/5ML solution 300 mg  15 mL Oral Q4H PRN Jamse Arn, MD      . levothyroxine (SYNTHROID) tablet 150 mcg  150 mcg Oral Q0600 Cathlyn Parsons, PA-C   150 mcg at 08/30/18 0559  . loratadine (CLARITIN) tablet 10 mg  10 mg Oral Daily Cathlyn Parsons, PA-C   10 mg at 08/30/18 8119  . multivitamin with minerals tablet 1 tablet  1 tablet Oral Daily Cathlyn Parsons, PA-C   1 tablet at 08/30/18 1008  . nutrition supplement (JUVEN) (JUVEN) powder packet 1 packet  1 packet Oral BID BM AngiulliLavon Paganini, PA-C   1 packet at 08/30/18 0813  . ondansetron (ZOFRAN-ODT) disintegrating tablet 4 mg  4 mg Oral Q8H PRN Cathlyn Parsons, PA-C  4 mg at 08/23/18 0429  . oxyCODONE (Oxy IR/ROXICODONE) immediate release tablet 5 mg  5 mg Oral Q6H PRN Cathlyn Parsons, PA-C   5 mg at 08/30/18 0813  . pantoprazole (PROTONIX) EC tablet 40 mg  40 mg Oral Daily Cathlyn Parsons, PA-C   40 mg at 08/30/18 0813  . protein supplement (ENSURE MAX) liquid  11 oz Oral BID [name], MD   11 oz at 08/30/18 1008  . rivaroxaban (XARELTO) tablet 20 mg  20 mg Oral Daily [name], MD   20 mg at 08/30/18 0370  . senna-docusate (Senokot-S) tablet 2 tablet  2 tablet Oral Q0600 [name], MD   2 tablet at 08/25/18 [phone]  . sertraline (ZOLOFT) tablet 50 mg  50 mg Oral Daily Cathlyn Parsons, PA-C   50 mg at 08/30/18 9169     Discharge Medications: Please see discharge summary for a list of discharge medications.  Relevant Imaging Results:  Relevant Lab Results:   Additional Information SS#  [id number]  Lennart Pall, LCSW

## 2018-08-30 NOTE — Progress Notes (Signed)
Nutrition Follow-up  DOCUMENTATION CODES:   Not applicable  INTERVENTION:   - Continue 1 packet Juven BID, each packet provides 90 calories, 2.5 grams of protein, 8 grams of carbohydrate, and 14 grams of amino acids (supplement contains CaHMB, glutamine, and arginine to promote wound healing)  - Continue MVI with minerals daily  - Continue Ensure Max po BID, each supplement provides 150 kcal and 30 grams of protein  - d/c Pro-stat  NUTRITION DIAGNOSIS:   Increased nutrient needs related to wound healing as evidenced by estimated needs.  Ongoing, being addressed via oral nutrition supplements  GOAL:   Patient will meet greater than or equal to 90% of their needs  Progressing  MONITOR:   Supplement acceptance, PO intake, Labs, Weight trends, Skin, I & O's  REASON FOR ASSESSMENT:   Low Braden    ASSESSMENT:   53 year old female with PMH of HTN, CKD stage III, pulmonary emboli maintained on Xarelto, myelopathy and paraplegia secondary to metastaticHurthlecancer to the thoracic spine s/p recurrence of T3 spinal tumor with resection in June 2019. Pt has received inpatient rehab services in the past last being 09/21/17 until 10/30/2017. Pt was discharged to SNF at that time. Pt has since returned home with family. Pt presented 08/10/18 with severe sacral decubitus ulcer that began in late February and gradually worsened since that time. CT the pelvis showed right inferior pubic ramus region gas containing decubitus wound new since February but no CT evidence of osteomyelitis. Pt underwent bedside debridement per general surgery with hydrotherapy initiated. Pt admitted to CIR on 5/26.  Noted plan has changed and pt will now d/c to SNF. Wound VAC remains in place.  Noted pt has been refusing Pro-stat due to it burning her throat. RD to d/c.  Weights are trending up since admission to CIR.  Spoke with pt at bedside. Pt reports that her appetite and PO intake are slightly better  compared to last week. Pt states that she is trying to eat more. Pt confirms Pro-stat burning her throat. RD discussed importance of taking Juven and Ensure Max. Pt expresses understanding.  Pt states that she likes Ensure Max much more than Ensure Enlive because it is thinner. Pt endorses drinking at least one daily. Pt states that she feels full in the evening and does not like to drink one at that time. Pt amenable to drinking an Ensure Max in the morning and again in the early afternoon. RD to change orders to reflect this schedule.  Meal Completion: 10-75% x last 8 recorded meals  Medications reviewed and include: Pro-stat 30 ml BID, Dulcolax, MVI with minerals, Protonix, Ensure Max, Juven BID, Senna  Labs reviewed: sodium 132, hemoglobin 8.9 CBG's: 77-151 x 24 hours  NUTRITION - FOCUSED PHYSICAL EXAM:    Most Recent Value  Orbital Region  No depletion  Upper Arm Region  No depletion  Thoracic and Lumbar Region  No depletion  Buccal Region  No depletion  Temple Region  No depletion  Clavicle Bone Region  Mild depletion  Clavicle and Acromion Bone Region  Mild depletion  Scapular Bone Region  Unable to assess  Dorsal Hand  Mild depletion  Patellar Region  Mild depletion  Anterior Thigh Region  Moderate depletion  Posterior Calf Region  Moderate depletion  Edema (RD Assessment)  None  Hair  Reviewed  Eyes  Reviewed  Mouth  Reviewed  Skin  Reviewed  Nails  Reviewed       Diet Order:   Diet Order  Diet regular Room service appropriate? Yes; Fluid consistency: Thin  Diet effective now              EDUCATION NEEDS:   Education needs have been addressed  Skin:  Skin Assessment: Skin Integrity Issues: DTI: right foot, left heel Stage I: left heel Unstageable: right labia Wound Vac: left buttocks, right buttocks  Last BM:  08/30/18  Height:   Ht Readings from Last 1 Encounters:  08/17/18 5\' 11"  (1.803 m)    Weight:   Wt Readings from Last 1  Encounters:  08/28/18 95.7 kg    Ideal Body Weight:  70.45 kg  BMI:  Body mass index is 29.43 kg/m.  Estimated Nutritional Needs:   Kcal:  2200-2400  Protein:  115-130 grams  Fluid:  >/= 2.0 L    Gaynell Face, MS, RD, LDN Inpatient Clinical Dietitian Pager: [phone] Weekend/After Hours: [phone]

## 2018-08-30 NOTE — Progress Notes (Signed)
Social Work Patient ID: Sarah [name], female   DOB: 05/30/1965, 53 y.o.   MRN: [id number]   Have discussed the d/c plans with pt and the issues being that her care needs that she has are too great for family to be able to manage at this point.  Pt nods in agreement and says, "OK".  She notes that she had thought this would be the case but had asked me to talk with her family.  We discussed her preferences of facilities.  Will begin bed search.  Jarry Manon, LCSW

## 2018-08-30 NOTE — Progress Notes (Signed)
Occupational Therapy Session Note  Patient Details  Name: Sarah [name] MRN: [id number] Date of Birth: 1966-01-18  Today's Date: 08/30/2018 OT Individual Time: 8921-1941 and 1300-1415 OT Individual Time Calculation (min): 75 min and 75 min   Short Term Goals: Week 2:  OT Short Term Goal 1 (Week 2): STG=LTG due to LOS  Skilled Therapeutic Interventions/Progress Updates:    Session One: Pt seen for OT ADL bathing/dressing session. Pt in supine upon arrival, agreeable to tx session and denying pain. Pt desiring to complete bathing/dressing this morning. LB bathing/dressing completed from bed level with set-up assist and min A for management of LEs and to maintain modified circle/long sitting positions in order to reach LEs. She was able to reach to foot on R LE and to mid calf on L LE. She used reacher to assist with threading pants on, therapist cont to assist with management/positioning of LEs and for clothing management. Bed positioned to be flat and pt used bed rails and min A to roll in order to reach behind and pull pants up. TED hose and shoes donned total A.  She transferred to sitting EOB with max A. Maintaining static sitting balance EOB with supervision and B UE support. Max A sliding board transfer to power w/c.  She completed UB bathing/dressing and grooming tasks completed from w/c level at sink, assist to wash back and set-up of items.  Completed UE strengthening exercises using #4 dowel rod for x10 bicep curls and x10 chest press. Attempted overhead press, however, pt without full shoulder ROM L>R movement and pt losing balance to R when attempting B UE movement. Therefore modified to bodyweight only lifting one UE at a time. Provided scapular ROM to L shoulder and deep message at trigger point near clavicle. Heat pad applied at end of session.  Pt left sitting up in power w/c at end of session, all needs in reach, reviewed need for pressure relief schedule.   Sessione Two:  Pt seen  for OT session focusing on functional mobility during ADL task and UE/core strengthening. Pt sitting up in power w/c upon arrival having just finished lunch and agreeable to tx session. Complaints of L shoulder pain throughout session, repositioned and rest provided throughout session. Pt reports brief had not been checked since this therapist left at 9:45 this morning. Education provided regarding need to check now that pt is able to void urine.  She completed mod A sliding board transfer back to bed. Rolled with min A in order for brief to be donned. Pt actively peeing with brief change. Total A for hygiene and donning new brief, RN applied new bandage to wound without vac.  Pt returned to sitting on EOB with max A and max a sliding board transfer back to power w/c/ Grooming tasks at sink mod I. UE strengthening/endurance using arm cycle, x3 minutes and x2 minutes with rest break btwn trials. Functional reaching task from w/c level with pt required to anteriorly weight shift and reach outside BOS to obtain bean bag and then toss. Increased time and effort required for weight shift and assist from UE at times.  Pt returned to room at end of session, left seated in power w/c with all needs in reach, reminded of pressure relief.  Therapy Documentation Precautions:  Precautions Precautions: Fall Precaution Comments: stage 4 sacral wound Restrictions Weight Bearing Restrictions: No   Therapy/Group: Individual Therapy  Sarah [name] L 08/30/2018, 7:05 AM

## 2018-08-30 NOTE — Progress Notes (Addendum)
Supp. Given at 2100, no stool felt in rectum. Dig stim not effective because of liquid stool.  Refused scheduled senna Sunday morning. At 2125, incontinent of urine, patient unaware, bladder scan=183cc's. At [place], incontinent of B & B, moderate, type 7 stool. Bladder scan=130cc's. Foam dressing applied to sacrum, shearing and MASD. Foam dressings to bilateral heels, along with prevalon boots. Air mattress in place. PRN oxy IR given at 0120 for C/O lower back pain. Refusing pro stat, drinks 1/2 of ensure max. New wound VAC cannister placed on previous shift, new cannister marked at start of shift. Sarah [name] A  Continues to have spasms to BLE's, especially when repositioning patient.

## 2018-08-31 ENCOUNTER — Inpatient Hospital Stay (HOSPITAL_COMMUNITY): Payer: Medicare Other | Admitting: Occupational Therapy

## 2018-08-31 ENCOUNTER — Ambulatory Visit (HOSPITAL_COMMUNITY): Payer: Medicare Other | Admitting: *Deleted

## 2018-08-31 ENCOUNTER — Inpatient Hospital Stay (HOSPITAL_COMMUNITY): Payer: Medicare Other | Admitting: Physical Therapy

## 2018-08-31 ENCOUNTER — Encounter (HOSPITAL_COMMUNITY): Payer: Medicare Other | Admitting: Psychology

## 2018-08-31 MED ORDER — POLYETHYLENE GLYCOL 3350 17 G PO PACK
17.0000 g | PACK | Freq: Every day | ORAL | Status: DC
  Administered 2018-08-31 – 2018-09-03 (×3): 17 g via ORAL
  Filled 2018-08-31 (×4): qty 1

## 2018-08-31 NOTE — Progress Notes (Signed)
Recreational Therapy Session Note  Patient Details  Name: Sarah [name] MRN: [id number] Date of Birth: July 02, 1965 Today's Date: 08/31/2018 Time:  1430-1520 Pain: c/o HA, unrated. Pt stated she would be ok Skilled Therapeutic Interventions/Progress Updates: Session focused on bed mobility, slide board transfers and dynamic sitting balance during co-treat with PT.  Pt performs bed mobility with min-mod assist, mod assist for slide board transfers, min-mod assist for dynamic sitting balance EOM for tabletop leisure task. Therapy/Group: Co-Treatment  Shanele Nissan 08/31/2018, 3:37 PM

## 2018-08-31 NOTE — Progress Notes (Signed)
[facility] PHYSICAL MEDICINE & REHABILITATION PROGRESS NOTE   Subjective/Complaints: Up in bed. Eating breakfast. No new complaints. Denies pain this morning. We talked about dispo and she was aware. Doesn';t like taste of senna-s (crushed).   ROS: Patient denies fever, rash, sore throat, blurred vision, nausea, vomiting, diarrhea, cough, shortness of breath or chest pain,   headache, or mood change.    Objective:   No results found. No results for input(s): WBC, HGB, HCT, PLT in the last 72 hours. No results for input(s): NA, K, CL, CO2, GLUCOSE, BUN, CREATININE, CALCIUM in the last 72 hours.  Intake/Output Summary (Last 24 hours) at 08/31/2018 0907 Last data filed at 08/30/2018 1853 Gross per 24 hour  Intake 480 ml  Output -  Net 480 ml     Physical Exam: Vital Signs Blood pressure 103/65, pulse 76, temperature 98.3 F (36.8 C), temperature source Oral, resp. rate 19, height 5\' 11"  (1.803 m), weight 95.7 kg, last menstrual period 10/22/2016, SpO2 100 %. Constitutional: No distress . Vital signs reviewed. HEENT: EOMI, oral membranes moist Neck: supple Cardiovascular: RRR without murmur. No JVD    Respiratory: CTA Bilaterally without wheezes or rales. Normal effort    GI: BS +, non-tender, non-distended  Musc: Lower extremity edema Neurological: Somnolent Follows full commands.  Motor: Bilateral upper extremities: Grossly 4/5 proximal to distal, stable.  Bilateral lower extremities: 0/5 proximal to distal-no changes today.  extensor tone is 1/4 Sensation absent to light touch Skin: Skin iswarm. She isnot diaphoretic. Vac on buttocks/sealed. Psychiatric: pleasant and cooperative    Assessment/Plan: 1. Functional deficits secondary to debility related to decubitus ulcers/UTI in setting of thoracic paraplegia which require 3+ hours per day of interdisciplinary therapy in a comprehensive inpatient rehab setting.  Physiatrist is providing close team supervision and 24 hour  management of active medical problems listed below.  Physiatrist and rehab team continue to assess barriers to discharge/monitor patient progress toward functional and medical goals  Care Tool:  Bathing    Body parts bathed by patient: Right arm, Left arm, Chest, Abdomen, Face, Right upper leg, Left upper leg, Right lower leg, Left lower leg, Front perineal area   Body parts bathed by helper: Buttocks Body parts n/a: Buttocks(Sacral wounds with dressings)   Bathing assist Assist Level: Minimal Assistance - Patient > 75%     Upper Body Dressing/Undressing Upper body dressing   What is the patient wearing?: Pull over shirt    Upper body assist Assist Level: Set up assist    Lower Body Dressing/Undressing Lower body dressing      What is the patient wearing?: Incontinence brief, Pants     Lower body assist Assist for lower body dressing: Moderate Assistance - Patient 50 - 74%     Toileting Toileting Toileting Activity did not occur Landscape architect and hygiene only): Safety/medical concerns  Toileting assist Assist for toileting: Dependent - Patient 0%     Transfers Chair/bed transfer  Transfers assist     Chair/bed transfer assist level: Moderate Assistance - Patient 50 - 74%     Locomotion Ambulation   Ambulation assist   Ambulation activity did not occur: N/A          Walk 10 feet activity   Assist  Walk 10 feet activity did not occur: N/A        Walk 50 feet activity   Assist Walk 50 feet with 2 turns activity did not occur: N/A         Walk  150 feet activity   Assist Walk 150 feet activity did not occur: N/A         Walk 10 feet on uneven surface  activity   Assist Walk 10 feet on uneven surfaces activity did not occur: N/A         Wheelchair     Assist Will patient use wheelchair at discharge?: Yes Type of Wheelchair: Power    Wheelchair assist level: Independent Max wheelchair distance: 150'     Wheelchair 50 feet with 2 turns activity    Assist        Assist Level: Independent   Wheelchair 150 feet activity     Assist     Assist Level: Independent    Medical Problem List and Plan: 1.Debility/paraplegiasecondary to sacral decubitus status post debridement with history of metastaticHurthlecancer followed by 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  2. Antithrombotics: -DVT/anticoagulation:Chronic Xarelto for history of pulmonary emboli -antiplatelet therapy: N/A 3. Pain Management:Baclofen 10 mg (reduced dose) 3 times daily, oxycodone as needed     4. Mood:Zoloft 50 mg daily- appreciate Neuropsych note  -antipsychotic agents: N/A 5. Neuropsych: This patientiscapable of making decisions on herown behalf. 6. Skin/Wound Care:Continuesantyl andhydrotherapy per PT   -wound vac with change M/W/F per [facility]  7. Fluids/Electrolytes/Nutrition: encourage PO 8.Acute on chronic anemia.   Hemoglobin holding at 8.9 6/1  -fe++ supp 9.Neurogenic bowel and bladderas well as Klebsiella UTI.   Establish bowel program in evening, getting closer  -I/O cath schedule   -Completed course of Rocephin 10. Hypothyroidism. Synthroid 11. Hypotension:  Vitals:   08/30/18 1934 08/31/18 0554  BP: 140/80 103/65  Pulse: 78 76  Resp: 18 19  Temp: 98.5 F (36.9 C) 98.3 F (36.8 C)  SpO2: 98% 100%    -likely autonomic  -decreased baclofen to 10mg    without much of a change in bp's   -holding lyrica  -  thyroid studies reviewed and WNL  -encourage fluids  -continue florinef trial  Echo   from 2019 EF of 55 to 60%      12. .  CKD stage III- avoid nephrotoxic meds   Creatinine 1.25 6/1  -recheck tomorrow  LOS: 14 days A FACE TO FACE EVALUATION WAS PERFORMED  [name] 08/31/2018, 9:07 AM

## 2018-08-31 NOTE — Consult Note (Signed)
Neuropsychological Consultation   Patient:   Sarah [name]   DOB:   1965/12/13  MR Number:  [id number]  Location:  [facility] [id number] [place] [address] Dept: [phone] Loc: [phone]           Date of Service:   08/31/2018  Start Time:   1 PM End Time:   2 PM  Provider/Observer:  Sarah [name], Psy.D.       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  The patient had been seen by the inpatient rehab services between September 21, 2017 until October 30, 2017.  The patient was discharged to the [facility] skilled nursing facility.  The patient later returned home and had been living with a sister and brother-in-law the patient presented on 08/10/2018 with severe sacral decubitus ulcer that began in late February gradually worsened since that time as well as fever of 102.5, white blood count 16,800, hemoglobin 6.1.  The patient was started on broad-spectrum antibiotics.  The patient has returned to the comprehensive rehabilitation program for further therapeutic interventions in preparation for returning home.   The above chief complaint has been reviewed for this visit and remains applicable for the current visit.    Reason for Service:  The patient was referred for neuropsychological consultation due to coping and adjustment issues.  Below is the HPI for the current admission.  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 Melchor [name] has received inpatient rehab services in the past last being 09/21/2017 until 10/30/2017 was discharged to [facility] skilled nursing facility. Patient has since returned home with her sister and brother-in-law nonambulatory using a sliding board with 1 person assist for bed to wheelchair transfers. Sister and brother-in-law all assist in all aspects of bathing dressing toileting and functional mobility. They live in a 1 level home with ramped entrance. Presented 08/10/2018 with severe sacral decubitus ulcer that began in late February gradually worsened since that time as well as fever of 102.5, WBC 16,800, hemoglobin 6.1. Initial blood culture from 518 with growth in 1 bottle GPC likely contaminant. Follow-up blood cultures show no growth to date. Placed on intravenous broad-spectrum antibiotics and transitionedto p.oclindamycin completed 08/17/2018. CT the pelvis showed right inferior pubic ramus region gas containing decubitus wound new since February PET/CTbut no CT evidence of osteomyelitis. Underwent bedside debridement per general surgery with hydrotherapy initiated. Hospital course pain management. She remains on chronic Xarelto as prior to admission. Acute on chronic anemia latest hemoglobin 8.5. Klebsiella UTIcompleting a course of Rocephin. Tolerating a regular diet. Therapy evaluations completed and patient was admitted for a comprehensive rehab program.  Current Status:  The patient reports that depressive symptoms have improved a great deal and she is coping better as infection has improved.  She is likely going to SNF upon discharge and patient understands and is disappointed but  agreeing with decision.     Behavioral Observation: Sarah [name]  presents as a 53 y.o.-year-old Right Caucasian  Female who appeared her stated age. her dress was Appropriate and she was Well Groomed and her manners were Appropriate to the situation.  her participation was indicative of Appropriate and Attentive behaviors.  There were any physical disabilities noted.  she displayed an appropriate level of cooperation and motivation.     Interactions:    Active Appropriate and Attentive  Attention:   within normal limits and attention span and concentration were age appropriate  Memory:   within normal limits; recent and remote memory intact  Visuo-spatial:  not examined  Speech (Volume):  low  Speech:   normal; normal  Thought Process:  Coherent and Relevant  Though Content:  WNL; not suicidal and not homicidal  Orientation:   person, place, time/date and situation  Judgment:   Fair  Planning:   Fair  Affect:    Appropriate  Mood:    Dysphoric  Insight:   Good  Intelligence:   normal  Medical History:   Past Medical History:  Diagnosis Date  . Anxiety   . Cancer [facility])    FNA of thyroid positive for onconytic/hurthle cell carcinoma  . Chronic back pain   . Complication of anesthesia    Nausea and vomiting  . DDD (degenerative disc disease), cervical   . Depression   . DJD (degenerative joint disease)   . Family history of adverse reaction to anesthesia    MOTHER HAS NAUSEA   . GERD (gastroesophageal reflux disease)   . History of blood transfusion    08/2017  . History of rectal fissure   . HIT (heparin-induced thrombocytopenia) ([place])   . Hypertension    05/27/2018- not current- runs low  . Hypothyroidism   . Obesity   . Paraplegia at T4 level ([place])   . Pneumonia 03/2018  . Pulmonary embolus, right ([employer]) 2015  . Rotator cuff tendonitis right       Psychiatric History:  The patient does have a history of anxiety and depression and is gone through significant medical issues and dealing with paraplegia and results of her cancer treatments.  The patient has now developed  significant wound.  The patient is taking Zoloft/sertraline for her depression and reports that it is doing well for her symptoms.  Family Med/Psych History:  Family History  Problem Relation Age of Onset  . Hypertension Mother   . Diabetes Mother   . Hypertension Father   . Stroke Father   . Diabetes Father   . Diabetes Sister   . Diabetes Sister     Risk of Suicide/Violence: low the patient denies any suicidal or homicidal ideation.  Impression/DX:  Bo Mcclintock. 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  The patient had been seen by the inpatient rehab services between September 21, 2017 until October 30, 2017.  The patient was discharged to the [facility] skilled nursing facility.  The patient later returned home and had been living with a sister and brother-in-law the patient presented on 08/10/2018 with severe sacral decubitus ulcer that began in late February gradually worsened since that time as well as fever of 102.5, white blood count 16,800, hemoglobin 6.1.  The patient was started  on broad-spectrum antibiotics.  The patient has returned to the comprehensive rehabilitation program for further therapeutic interventions in preparation for returning home.  The patient reports that depressive symptoms have improved a great deal and she is coping better as infection has improved.  She is likely going to SNF upon discharge and patient understands and is disappointed but agreeing with decision.   Disposition/Plan:  Will follow up with the patient next week to continue to monitor issues of anxiety and depression.         Electronically Signed   _______________________ Sarah [name], Psy.D.

## 2018-08-31 NOTE — Progress Notes (Signed)
Occupational Therapy Session Note  Patient Details  Name: Sarah [name] MRN: [id number] Date of Birth: 01/01/1966  Today's Date: 08/31/2018 OT Individual Time: 2574-9355 OT Individual Time Calculation (min): 55 min    Short Term Goals: Week 2:  OT Short Term Goal 1 (Week 2): STG=LTG due to LOS  Skilled Therapeutic Interventions/Progress Updates:    Pt seen for OT ADL session focusing on functional mobility, ADL re-training and functional activity tolerance. Pt in supine upon arrival with other OT and RN present assisting with bed level pericare/buttock hygiene, hand off to this OT to cont with ADL session.  Pt completed LB dressing at bed level with min A for management of LEs and assist for clothing management while pt threaded on pants with use of reacher.  She rolled with min A and able to reach behind to pull pants up.  She transferred to sitting EOB with max A using hospital bed functions. Completed max A sliding board transfer to power w/c with +2 assist to help stabilze equipment.  Assisted with washing hair at sink level. Pt able to dry hair using hair dryer and style using B UEs at functional level with intermittent rest breaks. Oral care completed with set-up.  Pt left seated in w/c at end of session, all needs in reach. Reviewed need for pressure relief schedule.   Therapy Documentation Precautions:  Precautions Precautions: Fall Precaution Comments: stage 4 sacral wound Restrictions Weight Bearing Restrictions: No   Therapy/Group: Individual Therapy  Kessler Solly L 08/31/2018, 7:00 AM

## 2018-08-31 NOTE — Progress Notes (Signed)
Physical Therapy Session Note  Patient Details  Name: Sarah [name] MRN: [id number] Date of Birth: Nov 05, 1965  Today's Date: 08/31/2018 PT Individual Time: 1430-1530 PT Individual Time Calculation (min): 60 min   Short Term Goals: Week 2:  PT Short Term Goal 1 (Week 2): =LTG due to ELOS  Skilled Therapeutic Interventions/Progress Updates:    Pt received seated in power w/c in room, agreeable to PT session. No complaints of pain. Pt unsure if she has been incontinent in her brief. Pt is able to set w/c up for slide board transfer back to bed. Slide board transfer with mod A. Sit to supine mod A for BLE management. Rolling L/R with min A for brief change and pericare following urinary incontinence. Supine to sitting with max A with HOB elevated. Slide board transfer back to w/c with mod A. Power w/c mobility x 150 ft at mod I level. Slide board transfer w/c to/from mat table mod I. Seated balance EOM x 10 min completing puzzle with use of alt UE with CGA to min A to maintain sitting balance. Seated 2# weighted ball passes anteriorly with mod to max A to maintain sitting balance when reaching outside BOS with BUE. Pt left seated in power w/c in room with needs in reach at end of session. Pt is able to verbalize pressure relief schedule and reports she is performing pressure relief on schedule despite pt frequently being observed asleep in w/c.  Therapy Documentation Precautions:  Precautions Precautions: Fall Precaution Comments: stage 4 sacral wound Restrictions Weight Bearing Restrictions: No    Therapy/Group: Individual Therapy   Excell Seltzer, PT, DPT  08/31/2018, 3:59 PM

## 2018-08-31 NOTE — Progress Notes (Signed)
Occupational Therapy Session Note  Patient Details  Name: Sarah [name] MRN: [id number] Date of Birth: 01/02/1966  Today's Date: 08/31/2018 OT Individual Time: 0830-0900 OT Individual Time Calculation (min): 30 min    Short Term Goals: Week 1:  OT Short Term Goal 1 (Week 1): Pt will tolerate sitting EOB for 5 minutes with CGA in prep for functional task OT Short Term Goal 1 - Progress (Week 1): Met OT Short Term Goal 2 (Week 1): Pt will complete sliding board transfer EOB>w/c with mod A OT Short Term Goal 2 - Progress (Week 1): Progressing toward goal OT Short Term Goal 3 (Week 1): Pt will thread one LE into pants with max A using AE PRN from bed level OT Short Term Goal 3 - Progress (Week 1): Met Week 2:  OT Short Term Goal 1 (Week 2): STG=LTG due to LOS  Skilled Therapeutic Interventions/Progress Updates:    1:1 Focus on LB bathing and dressing at bed level. Focus on pt managing bed controls and directing caregiver for assistance with leg positioning. Pt unable to reach feet requiring A. Pt able to roll with min to her left and mod to her left for washing and caring for skin in peri area and buttocks. Handed off to next therapist.   Therapy Documentation Precautions:  Precautions Precautions: Fall Precaution Comments: stage 4 sacral wound Restrictions Weight Bearing Restrictions: No Pain:  no c/o pain   Therapy/Group: Individual Therapy  Willeen [facility] 08/31/2018, 3:34 PM

## 2018-08-31 NOTE — Patient Care Conference (Signed)
Inpatient RehabilitationTeam Conference and Plan of Care Update Date: 08/31/2018   Time: 2:00 PM    Patient Name: Sarah [name]      Medical Record Number: [id number]  Date of Birth: Jul 02, 1965 Sex: Female         Room/Bed: 4W13C/4W13C-01 Payor Info: Payor: [employer] MEDICARE / Plan: [facility] MEDICARE / Product Type: *No Product type* /    Admitting Diagnosis: SCI Team Debility, malaise; 12-16days  Admit Date/Time:  08/17/2018  2:57 PM Admission Comments: No comment available   Primary Diagnosis:  <principal problem not specified> Principal Problem: <principal problem not specified>  Patient Active Problem List   Diagnosis Date Noted  . CKD (chronic kidney disease), stage III ([place])   . Hypoalbuminemia due to protein-calorie malnutrition ([place])   . Hyperglycemia   . Nasal congestion   . Hypotension   . Acute on chronic anemia   . Febrile illness   . Bacterial skin infection 08/10/2018  . Eustachian tube disorder, left 06/30/2018  . Urinary tract infection 06/30/2018  . Malignant neoplasm ([place]) 06/01/2018  . Metastasis to brain ([place]) 06/01/2018  . Transaminitis 04/09/2018  . Hypoalbuminemia 04/09/2018  . Dyspnea   . Multifocal pneumonia   . Palliative care by specialist   . Goals of care, counseling/discussion   . DNR (do not resuscitate) discussion   . Fecal impaction ([place]) 02/06/2018  . HCAP (healthcare-associated pneumonia) 02/06/2018  . Anemia   . Chronic anticoagulation   . Sacral ulcer ([place]) 02/04/2018  . Fracture   . Acute lower UTI   . Myelopathy ([place]) 09/21/2017  . Muscle spasms of both lower extremities   . Closed fracture of shaft of left femur ([place])   . Acute blood loss anemia   . Hypothyroidism   . Nausea & vomiting 09/15/2017  . Closed fracture of left distal femur ([place]) 09/11/2017  . Paraplegia ([place]) 09/08/2017  . Obesity (BMI 30-39.9) 09/03/2017  . Status post surgery 09/03/2017  . Calculus of gallbladder without cholecystitis without obstruction    . Biliary dyskinesia   . Intractable nausea and vomiting 08/09/2017  . Thrombocytopenia ([place]) 08/09/2017  . Elevated bilirubin 08/09/2017  . AKI (acute kidney injury) ([place]) 08/09/2017  . Chest pain 01/26/2017  . Upper respiratory infection, viral 01/26/2017  . Counseling regarding advanced care planning and goals of care 01/02/2017  . Bilateral rotator cuff syndrome 02/12/2016  . E. coli UTI 08/01/2015  . Constipation due to neurogenic bowel 08/01/2015  . 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 Spastic paraparesis   . Neurogenic bladder   . History of pulmonary embolism   . Post-operative pain   . Metastatic cancer ([place])   . Steroid-induced hyperglycemia   . Depression   . Obstipation   . Thoracic spine tumor 07/06/2015  . 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 Spinal cord compression due to malignant neoplasm metastatic to spine ([place])   . Postoperative hypothyroidism   . 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 Spasticity 09/19/2014  . Hypertension 05/01/2014  . Ingrown right big toenail 05/01/2014  . GERD (gastroesophageal reflux disease) 02/14/2014  . Dysphagia, pharyngoesophageal phase 02/06/2014  . Type 2 diabetes mellitus without complication ([place]) 25/07/3974  . Constipation   . Neurogenic bowel   . Paraplegia at T4 level ([place]) 01/20/2014  . Metastatic cancer to spine ([place]) 01/19/2014  . Rotator cuff tendonitis   . 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  Leg weakness, bilateral 12/13/2013    Expected Discharge Date: Expected Discharge Date: (SNF)  Team Members Present: Physician leading conference: Dr. Alger Simons Social Worker Present: Lennart Pall, LCSW Nurse Present: Other (comment)(Akivia Cletus Gash, RN) PT Present: Other (comment)(Wirick Ervin Knack, PT) OT Present: Amy Rounds, OT PPS Coordinator present : Gunnar Fusi     Current Status/Progress Goal  Weekly Team Focus  Medical   bp's better. pain seems controlled. slow neuro progress. wound care ongoing with vac to buttocks  stabilize medically for care at next venue  see above   Bowel/Bladder   LBM 6/9 refuses daily senna s tab  Bulk up bowel, initiate dig stim 35min after supp.  assess toileting needs qshift and PRN   Swallow/Nutrition/ Hydration             ADL's   Mod A LB dressing bed level; set-up UB bathing/dressing and grooming tasks; mod-max A sliding board transfers; total A toileting bed level  Mod A overall  UE and core strengthening/endurance; functional transfers; ADL re-training from bed level   Mobility   min A rolling, mod A sit to supine, max A supine to sit, mod to max A SB transfers, mod I power w/c mobility  min to mod A overall  transfers, sitting balance, emphasizing importance of pressure relief   Communication             Safety/Cognition/ Behavioral Observations            Pain   scheduled baclofen, PRN oxycodone and tylenol  pain goal less than 3  assess pain qshift and PRN   Skin   Stage 2 to left heal, DTI to right foot, DTI to left heel, wounds to right and left ischial.  dressing changes as ordered. No further skin impairments. Promote proper wound healing. Keep free of infection.  assess skin qshift and PRN    Rehab Goals Patient on target to meet rehab goals: No *See Care Plan and progress notes for long and short-term goals.     Barriers to Discharge  Current Status/Progress Possible Resolutions Date Resolved   Physician    Medical stability;Wound Care        ongoing wound care at next level of care      Nursing                  PT  Medical stability;Incontinence;Neurogenic Bowel & Bladder;Wound Care                 OT                  SLP                SW                Discharge Planning/Teaching Needs:  Plan changed to SNF.  Bed search underway.  NA   Team Discussion:  No significant medical changes;  Ongoing wound  management.  BP better controlled with medication change.  Some voiding with low PVRs.  Currently mod- max overall with pt/ OT and no significant improvement this week. Ready for SNF whenever bed available.  Revisions to Treatment Plan: change in d/c plan to SNF    Continued Need for Acute Rehabilitation Level of Care: The patient requires daily medical management by a physician with specialized training in physical medicine and rehabilitation for the following conditions: Daily direction of a multidisciplinary physical rehabilitation program to ensure safe treatment while eliciting the highest  outcome that is of practical value to the patient.: Yes Daily medical management of patient stability for increased activity during participation in an intensive rehabilitation regime.: Yes Daily analysis of laboratory values and/or radiology reports with any subsequent need for medication adjustment of medical intervention for : Post surgical problems;Wound care problems;Neurological problems   I attest that I was present, lead the team conference, and concur with the assessment and plan of the team.   Wofford Stratton 08/31/2018, 3:00 PM    Team conference was held via web/ teleconference due to [place] - 19

## 2018-08-31 NOTE — Progress Notes (Signed)
Physical Therapy Session Note  Patient Details  Name: Sarah [name] MRN: [id number] Date of Birth: [birth year]  Today's Date: 08/31/2018 PT Individual Time: 1133-1232 PT Individual Time Calculation (min): 59 min   Short Term Goals: Week 1:  PT Short Term Goal 1 (Week 1): Pt will complete bed mobility with mod A PT Short Term Goal 1 - Progress (Week 1): Progressing toward goal PT Short Term Goal 2 (Week 1): Pt will complete least restrictive transfer with mod A PT Short Term Goal 2 - Progress (Week 1): Progressing toward goal PT Short Term Goal 3 (Week 1): Pt will demonstrate ability to perform effective pressure relief in w/c  PT Short Term Goal 3 - Progress (Week 1): Progressing toward goal  Skilled Therapeutic Interventions/Progress Updates:    Pt received sitting in power w/c and agreeable to therapy session. Pt able to state proper pressure relieving schedule and reports performing prior to therapist arrival. Performed power w/c mobility ~159ft to/from therapy gym mod-I. Set-up w/c for transfer to mat mod-I. Lateral scoot transfer w/c>EOM using transfer board with max assist for board placement and min/mod assist for scooting - pt able to verbally instruct for assistance with set-up for transfer and B LE management with min cuing. Pt reports she was able to place transfer boards for transfers prior to hospitalization. Performed sitting balance tasks of cone reaching outside BOS with 1 UE support and CGA/min assist for R lateral LOB. Performed sitting balance of lateral trunk lean onto forearm support while reaching with other UE for a cone outside BOS - CGA for steadying/safety throughout. Performed block practice lateral scoot transfers w/c<>EOM using transfer board with pt able to place and remove board with CGA - CGA for trunk balance when scooting to the L and min/mod assist for trunk control and scooting when scooting R. Pt performed w/c mobility back to room. Instructed pt to perform  pressure relief in w/c while therapist performed B LE hamstring and ankle DF stretch. Pt left sitting in w/c with needs in reach, lines intact, and meal tray set-up.   Therapy Documentation Precautions:  Precautions Precautions: Fall Precaution Comments: stage 4 sacral wound Restrictions Weight Bearing Restrictions: No  Pain: Denies pain during session.  Therapy/Group: Individual Therapy  Tawana Scale, PT, DPT 08/31/2018, 11:58 AM

## 2018-09-01 ENCOUNTER — Inpatient Hospital Stay (HOSPITAL_COMMUNITY): Payer: Medicare Other | Admitting: Physical Therapy

## 2018-09-01 ENCOUNTER — Inpatient Hospital Stay (HOSPITAL_COMMUNITY): Payer: Medicare Other | Admitting: Occupational Therapy

## 2018-09-01 ENCOUNTER — Encounter: Payer: Self-pay | Admitting: *Deleted

## 2018-09-01 LAB — CBC
HCT: 25.7 % — ABNORMAL LOW (ref 36.0–46.0)
Hemoglobin: 8.2 g/dL — ABNORMAL LOW (ref 12.0–15.0)
MCH: 29.1 pg (ref 26.0–34.0)
MCHC: 31.9 g/dL (ref 30.0–36.0)
MCV: 91.1 fL (ref 80.0–100.0)
Platelets: 112 10*3/uL — ABNORMAL LOW (ref 150–400)
RBC: 2.82 MIL/uL — ABNORMAL LOW (ref 3.87–5.11)
RDW: 17.1 % — ABNORMAL HIGH (ref 11.5–15.5)
WBC: 3.5 10*3/uL — ABNORMAL LOW (ref 4.0–10.5)
nRBC: 0 % (ref 0.0–0.2)

## 2018-09-01 LAB — BASIC METABOLIC PANEL
Anion gap: 8 (ref 5–15)
BUN: 10 mg/dL (ref 6–20)
CO2: 30 mmol/L (ref 22–32)
Calcium: 8.1 mg/dL — ABNORMAL LOW (ref 8.9–10.3)
Chloride: 99 mmol/L (ref 98–111)
Creatinine, Ser: 0.95 mg/dL (ref 0.44–1.00)
GFR calc Af Amer: >60 mL/min (ref >60)
GFR calc non Af Amer: >60 mL/min (ref >60)
Glucose, Bld: 88 mg/dL (ref 70–99)
Potassium: 3.8 mmol/L (ref 3.5–5.1)
Sodium: 137 mmol/L (ref 135–145)

## 2018-09-01 MED ORDER — BACLOFEN 20 MG PO TABS
20.0000 mg | ORAL_TABLET | Freq: Three times a day (TID) | ORAL | Status: DC
  Administered 2018-09-01 – 2018-09-03 (×7): 20 mg via ORAL
  Filled 2018-09-01 (×7): qty 1

## 2018-09-01 MED ORDER — DICLOFENAC SODIUM 1 % TD GEL
2.0000 g | Freq: Three times a day (TID) | TRANSDERMAL | Status: DC
  Administered 2018-09-01 – 2018-09-03 (×8): 2 g via TOPICAL
  Filled 2018-09-01 (×2): qty 100

## 2018-09-01 NOTE — Consult Note (Signed)
[place] Nurse wound consult note Patient receiving care in [facility] [phone].  Two pieces of black foam removed from right ischial wound, one piece of black foam placed into the wound bed. Immediate seal obtained on the VAC.  Two barrier rings used along the inferior and medial borders. Reason for Consult: NPWT dressing change Wound type: Surgical Wound bed: 90% beefy red; 10% brown/grey slough deep in wound bed Drainage (amount, consistency, odor) serosanginous in cannister Periwound: intact Additional dressing supply in room.  Dressing bridged to right hip.  While there I changed the left ischial wound dressing.  That wound is beefy red, 100% granulation tissue. Monitor the wound area(s) for worsening of condition such as: Signs/symptoms of infection,  Increase in size,  Development of or worsening of odor, Development of pain, or increased pain at the affected locations.  Notify the medical team if any of these develop.  Val Riles, RN, MSN, CWOCN, CNS-BC, pager [phone]

## 2018-09-01 NOTE — Patient Outreach (Signed)
[place] [facility]) Care Management  09/01/2018  Sarah [name] 06-03-1965 [id number]   [employer] patient assigned for [place] Management however chart review reveals pt will leave inpt rehab and go to SNF/LTC.She will not be eligible for [facility] Care Management at that time.  [name]. [name], MSN, [facility] Gerontological Nurse Practitioner [facility] Care Management [phone]

## 2018-09-01 NOTE — Progress Notes (Signed)
Occupational Therapy Session Note  Patient Details  Name: Sarah [name] MRN: [id number] Date of Birth: [birth year]  Today's Date: 09/01/2018 OT Individual Time: 0700-0800 and 1330-1425 OT Individual Time Calculation (min): 60 min and 55 min   Short Term Goals: Week 2:  OT Short Term Goal 1 (Week 2): STG=LTG due to LOS  Skilled Therapeutic Interventions/Progress Updates:    Session One: Pt seen for OT session focusing on LE stretching and ROM in prep for bed level ADLs. Pt awake in supine upon arrival, agreeable to tx session and denying pain. Pt with wound vac dressing change immediately following this session, therefore remained at bed level and not completing LB dressing this session.   Completed PROM and stretching to B LEs at all joints. Education, demonstration and return teach back for self stretching using bed sheet to manage LEs. Self stretching including calfs, and  Hamstring. L UE ROM performed to shoulder with assist to achieve full end range, completed shoulder flexion and ABduction.  Pt sat upright in bed and folded personal clothes that had just come from dryer addressing B UE use and ROM.  Pt left in supine at end of session with all needs in reach.   Session Two: Pt seen for OT session focusing on core strengthening/stability, weightshift and obtaining balance point. Pt sitting up in power w/c upon arrival, agreeable to tx session. Reports of pain in L shoulder/back throughout session, up to date on all pain medications and willing to participate as able. Repositioned and rest breaks provided throughout session. She completed sliding board transfers throughout session w/c>EOM>w/c>EOB with mod A overall. Seated EOM in gym, facilitated positioning for chest expansion and pec. Stretch/ massage.  Anterior/posterior  And R/L weightshifts from EOM without UE support working on leading weight shift with the head and controlled movements in each direction. Supported rest breaks required  throughout activity. Pt able to achieve unsupported sitting balance without UE support, however, unable to maintain due to R posterior LOB.  Pt returned to room at end of session, transferred back to bed to supine with mod A. Pt to call for nursing staff to check for incontinence chance of urine and encouraged to have nursing staff get her back up in w/c as pt does not like to spend daytime in bed. Working on pt directing care and self-advocacy. Pt left in supine with all needs in reach.   Therapy Documentation Precautions:  Precautions Precautions: Fall Precaution Comments: stage 4 sacral wound Restrictions Weight Bearing Restrictions: No   Therapy/Group: Individual Therapy  Rob Mciver L 09/01/2018, 6:38 AM

## 2018-09-01 NOTE — Progress Notes (Signed)
Physical Therapy Session Note  Patient Details  Name: Sarah [name] MRN: [id number] Date of Birth: 1965/09/17  Today's Date: 09/01/2018 PT Individual Time: 1005-1030 and 1545-1630 25 min and 45 min    Short Term Goals: Week 1:  PT Short Term Goal 1 (Week 1): Pt will complete bed mobility with mod A PT Short Term Goal 1 - Progress (Week 1): Progressing toward goal PT Short Term Goal 2 (Week 1): Pt will complete least restrictive transfer with mod A PT Short Term Goal 2 - Progress (Week 1): Progressing toward goal PT Short Term Goal 3 (Week 1): Pt will demonstrate ability to perform effective pressure relief in w/c  PT Short Term Goal 3 - Progress (Week 1): Progressing toward goal Week 2:  PT Short Term Goal 1 (Week 2): =LTG due to ELOS  Skilled Therapeutic Interventions/Progress Updates:  Session 1 Pt received supine in bed and agreeable to PT.  RN present to administer medication, while pt laying in bed. Supine>sit transfer with mod assist and moderate cues for LE management and use of BUE to push from bed rail come to sitting position. Sitting balance EOB x 5 minutes with supervision-CGA from PT for safety and cues for use of bed features to maintain balance as needed. Sb transfer to [facility] with max assist from PT an moderate cues for proper LE placement. WC mobility through tight space of room with supervision assist for awareness of bed position while backing up. Patien left sitting in [facility] with call bell in reach and all needs met.    Session 2.   Pt received supine in bed and agreeable to PT. Supine>sit transfer with moderate cues and assist from PT for LE management. PT deflated bed and pt performed SB transfer with mod assist and moderate cues for UE placement. WC mobility through hall of rehab unit and room with distant supervision assist-mod I level 2x 250f. Cues with WC mobility only for backing up in tight space of room. PT adjusted R armrest on chair to and in pain management in  shoulder. BUE therex to perform ball tap with chest press and 3# weight 3 x 1 min. [facility] returned to [facility] and [facility] and all needs met.          Therapy Documentation Precautions:  Precautions Precautions: Fall Precaution Comments: stage 4 sacral wound Restrictions Weight Bearing Restrictions: No Vital Signs: Therapy Vitals Temp: 98.5 F (36.9 C) Temp Source: Oral Pulse Rate: 75 Resp: 16 BP: 123/65 Patient Position (if appropriate): Lying Oxygen Therapy SpO2: 99 % O2 Device: Room Air Pain: Pain Assessment Pain Scale: 0-10 Pain Score: 5  Pain Type: Chronic pain Pain Location: Back Pain Orientation: Upper Pain Intervention(s): Medication (See eMAR);Repositioned    Therapy/Group: Individual Therapy  ALorie Phenix6/12/2018, 8:06 AM

## 2018-09-01 NOTE — Progress Notes (Signed)
[place] PHYSICAL MEDICINE & REHABILITATION PROGRESS NOTE   Subjective/Complaints: Pt with worsening extensor tone in LE's. Left shoulder sore also  ROS: Patient denies fever, rash, sore throat, blurred vision, nausea, vomiting, diarrhea, cough, shortness of breath or chest pain, headache, or mood change.    Objective:   No results found. Recent Labs    09/01/18 0640  WBC 3.5*  HGB 8.2*  HCT 25.7*  PLT 112*   Recent Labs    09/01/18 0640  NA 137  K 3.8  CL 99  CO2 30  GLUCOSE 88  BUN 10  CREATININE 0.95  CALCIUM 8.1*    Intake/Output Summary (Last 24 hours) at 09/01/2018 0917 Last data filed at 08/31/2018 1400 Gross per 24 hour  Intake 222 ml  Output -  Net 222 ml     Physical Exam: Vital Signs Blood pressure 123/65, pulse 75, temperature 98.5 F (36.9 C), temperature source Oral, resp. rate 16, height 5\' 11"  (1.803 m), weight 95.7 kg, last menstrual period 10/22/2016, SpO2 99 %. Constitutional: No distress . Vital signs reviewed. HEENT: EOMI, oral membranes moist Neck: supple Cardiovascular: RRR without murmur. No JVD    Respiratory: CTA Bilaterally without wheezes or rales. Normal effort    GI: BS +, non-tender, non-distended  Musc: Lower extremity edema. Pain at left [facility] jt Neurological: Somnolent Follows full commands.  Motor: Bilateral upper extremities: Grossly 4/5 proximal to distal, stable.  Bilateral lower extremities: 0/5 proximal to distal-no changes today.  extensor tone is 1-3/4 and worsens with PROM Sensation absent to light touch Skin: Skin iswarm. She isnot diaphoretic. Vac on buttocks/sealed. Psychiatric: pleasant and cooperative    Assessment/Plan: 1. Functional deficits secondary to debility related to decubitus ulcers/UTI in setting of thoracic paraplegia which require 3+ hours per day of interdisciplinary therapy in a comprehensive inpatient rehab setting.  Physiatrist is providing close team supervision and 24 hour management  of active medical problems listed below.  Physiatrist and rehab team continue to assess barriers to discharge/monitor patient progress toward functional and medical goals  Care Tool:  Bathing    Body parts bathed by patient: Right arm, Left arm, Chest, Abdomen, Face, Right upper leg, Left upper leg, Right lower leg, Left lower leg, Front perineal area   Body parts bathed by helper: Buttocks Body parts n/a: Buttocks(Sacral wounds with dressings)   Bathing assist Assist Level: Minimal Assistance - Patient > 75%     Upper Body Dressing/Undressing Upper body dressing   What is the patient wearing?: Pull over shirt    Upper body assist Assist Level: Set up assist    Lower Body Dressing/Undressing Lower body dressing      What is the patient wearing?: Incontinence brief, Pants     Lower body assist Assist for lower body dressing: Moderate Assistance - Patient 50 - 74%     Toileting Toileting Toileting Activity did not occur Landscape architect and hygiene only): Safety/medical concerns  Toileting assist Assist for toileting: Dependent - Patient 0%     Transfers Chair/bed transfer  Transfers assist     Chair/bed transfer assist level: Moderate Assistance - Patient 50 - 74%(slide board)     Locomotion Ambulation   Ambulation assist   Ambulation activity did not occur: N/A          Walk 10 feet activity   Assist  Walk 10 feet activity did not occur: N/A        Walk 50 feet activity   Assist Walk 50 feet with  2 turns activity did not occur: N/A         Walk 150 feet activity   Assist Walk 150 feet activity did not occur: N/A         Walk 10 feet on uneven surface  activity   Assist Walk 10 feet on uneven surfaces activity did not occur: N/A         Wheelchair     Assist Will patient use wheelchair at discharge?: Yes Type of Wheelchair: Power    Wheelchair assist level: Independent Max wheelchair distance: 151ft     Wheelchair 50 feet with 2 turns activity    Assist        Assist Level: Independent   Wheelchair 150 feet activity     Assist     Assist Level: Independent    Medical Problem List and Plan: 1.Debility/paraplegiasecondary to sacral decubitus status post debridement with history of metastaticHurthlecancer followed by Dr.Kale -Continue CIR therapies including PT, OT  -SNF pending.  2. Antithrombotics: -DVT/anticoagulation:Chronic Xarelto for history of pulmonary emboli -antiplatelet therapy: N/A 3. Pain Management:Baclofen 10 mg  3 times daily, oxycodone as needed  -increase baclofen to 20mg  tid to help with increased tone   bp more robust   -left AC jt, likely OA, ---voltaren gel 4. Mood:Zoloft 50 mg daily- appreciate Neuropsych note  -antipsychotic agents: N/A 5. Neuropsych: This patientiscapable of making decisions on herown behalf. 6. Skin/Wound Care:Continuesantyl andhydrotherapy per PT   -wound vac with change M/W/F per [place]  7. Fluids/Electrolytes/Nutrition: encourage PO 8.Acute on chronic anemia.   Hemoglobin down to 8.2, no signs of blood loss  -recheck Tomorrow  -fe++ supp 9.Neurogenic bowel and bladderas well as Klebsiella UTI.   Establish bowel program in evening, getting closer  -I/O cath schedule   -Completed course of Rocephin 10. Hypothyroidism. Synthroid 11. Hypotension:  Vitals:   08/31/18 1934 09/01/18 0433  BP: 124/68 123/65  Pulse: 71 75  Resp: 18 16  Temp: 99 F (37.2 C) 98.5 F (36.9 C)  SpO2: 100% 99%    -likely autonomic  -baclofen resumed as above   -holding lyrica  -  thyroid studies reviewed and WNL  -encourage fluids  -continue florinef trial  Echo   from 2019 EF of 55 to 60%      12. .  CKD stage III- avoid nephrotoxic meds   Creatinine down to 0.95 LOS: 15 days A FACE TO FACE EVALUATION WAS PERFORMED  [name] 09/01/2018, 9:17 AM

## 2018-09-02 ENCOUNTER — Inpatient Hospital Stay (HOSPITAL_COMMUNITY): Payer: Medicare Other

## 2018-09-02 ENCOUNTER — Inpatient Hospital Stay (HOSPITAL_COMMUNITY): Payer: Medicare Other | Admitting: Occupational Therapy

## 2018-09-02 ENCOUNTER — Ambulatory Visit (HOSPITAL_COMMUNITY): Payer: Medicare Other | Admitting: *Deleted

## 2018-09-02 LAB — CBC
HCT: 23.4 % — ABNORMAL LOW (ref 36.0–46.0)
Hemoglobin: 7.4 g/dL — ABNORMAL LOW (ref 12.0–15.0)
MCH: 28.6 pg (ref 26.0–34.0)
MCHC: 31.6 g/dL (ref 30.0–36.0)
MCV: 90.3 fL (ref 80.0–100.0)
Platelets: 125 10*3/uL — ABNORMAL LOW (ref 150–400)
RBC: 2.59 MIL/uL — ABNORMAL LOW (ref 3.87–5.11)
RDW: 16.9 % — ABNORMAL HIGH (ref 11.5–15.5)
WBC: 3.5 10*3/uL — ABNORMAL LOW (ref 4.0–10.5)
nRBC: 0 % (ref 0.0–0.2)

## 2018-09-02 MED ORDER — POLYETHYLENE GLYCOL 3350 17 G PO PACK: 17.0000 g | PACK | Freq: Every day | ORAL | 0 refills | Status: AC

## 2018-09-02 MED ORDER — FLUDROCORTISONE ACETATE 0.1 MG PO TABS: 0.1000 mg | ORAL_TABLET | Freq: Every day | ORAL | Status: AC

## 2018-09-02 MED ORDER — BISACODYL 10 MG RE SUPP: 10.0000 mg | Freq: Every day | RECTAL | 0 refills | Status: AC

## 2018-09-02 MED ORDER — OXYCODONE HCL 5 MG PO TABS: 5.0000 mg | ORAL_TABLET | Freq: Four times a day (QID) | ORAL | 0 refills | Status: AC | PRN

## 2018-09-02 MED ORDER — BACLOFEN 20 MG PO TABS: 20.0000 mg | ORAL_TABLET | Freq: Three times a day (TID) | ORAL | 0 refills | Status: AC

## 2018-09-02 MED ORDER — DICLOFENAC SODIUM 1 % TD GEL: 2.0000 g | Freq: Three times a day (TID) | TRANSDERMAL | Status: AC

## 2018-09-02 NOTE — Progress Notes (Signed)
Occupational Therapy Session Note  Patient Details  Name: Sarah [name] MRN: [id number] Date of Birth: 07-11-1965  Today's Date: 09/02/2018 OT Individual Time: 7017-7939 OT Individual Time Calculation (min): 55 min    Short Term Goals: Week 1:  OT Short Term Goal 1 (Week 1): Pt will tolerate sitting EOB for 5 minutes with CGA in prep for functional task OT Short Term Goal 1 - Progress (Week 1): Met OT Short Term Goal 2 (Week 1): Pt will complete sliding board transfer EOB>w/c with mod A OT Short Term Goal 2 - Progress (Week 1): Progressing toward goal OT Short Term Goal 3 (Week 1): Pt will thread one LE into pants with max A using AE PRN from bed level OT Short Term Goal 3 - Progress (Week 1): Met  Skilled Therapeutic Interventions/Progress Updates:    1:1 Pt received in w/c  With NT in room assessing BP 164/89. Pt reports nervous about lift transfer that was about to happen. Pt given min VC for directing care as if OT does not know how to assist with SBT back to bed. OT provides coaching for pt to direct care partner on various places to put hands on hips v bobath over back. Pt completes SBT with min A overall. Pt completes rolling B with min-mod A and OT changes brief. Wound vac dressing seems to be fused with brief and RN alerted to inspect after brief removed. BP reassessed 106/69. Pt requesting to stay in bed and completes UB therex circuit for UB strengthening to improve global arm strength required for BADLs and transfers with 2# dowel rod. Exited session with pt seated in bed, exit alarm on and call light in reach  Therapy Documentation Precautions:  Precautions Precautions: Fall Precaution Comments: stage 4 sacral wound Restrictions Weight Bearing Restrictions: No General:   Vital Signs: Therapy Vitals Temp: 98.6 F (37 C) Pulse Rate: 69 Resp: 18 BP: (!) 164/89 Patient Position (if appropriate): Sitting Oxygen Therapy SpO2: 100 % O2 Device: Room Air Pain:   ADL:    Vision   Perception    Praxis   Exercises:   Other Treatments:     Therapy/Group: Individual Therapy  Tonny Branch 09/02/2018, 3:30 PM

## 2018-09-02 NOTE — Discharge Summary (Signed)
Physician Discharge Summary  Patient ID: Sarah [name] MRN: [id number] DOB/AGE: [birth year] 53 y.o.  Admit date: 08/17/2018 Discharge date: 09/03/2018  Discharge Diagnoses:  Active Problems:   Paraplegia (HCC)   CKD (chronic kidney disease), stage III (HCC)   Hypoalbuminemia due to protein-calorie malnutrition (HCC)   Hyperglycemia   Nasal congestion   Hypotension   Acute on chronic anemia Neurogenic bowel and bladder Pain management Hypothyroidism  Discharged Condition: Stable  Significant Diagnostic Studies: Dg Chest 2 View  Result Date: 08/09/2018 CLINICAL DATA:  53 year old female with suspected sepsis. Metastatic Hurthle cell thyroid carcinoma. EXAM: CHEST - 2 VIEW COMPARISON:  PET-CT 05/13/2018 and earlier. FINDINGS: AP and lateral views of the chest. Mildly lower lung volumes compared to 2018. Stable cardiac size and mediastinal contours. No pneumothorax, pulmonary edema, pleural effusion or confluent pulmonary opacity. Left greater than right pleural effusions have resolved since February. Visualized tracheal air column is within normal limits. Chronic cervicothoracic/upper thoracic spinal hardware. Stable visualized osseous structures. Negative visible bowel gas pattern. IMPRESSION: No acute cardiopulmonary abnormality. Electronically Signed   By: Genevie Ann M.D.   On: 08/09/2018 22:04   Ct Pelvis W Contrast  Result Date: 08/10/2018 CLINICAL DATA:  53 year old female with suspected sepsis. Metastatic Hurthle cell thyroid carcinoma. Decubitus ulcers, query osteomyelitis. EXAM: CT PELVIS WITH CONTRAST TECHNIQUE: Multidetector CT imaging of the pelvis was performed using the standard protocol following the bolus administration of intravenous contrast. CONTRAST:  127mL OMNIPAQUE IOHEXOL 300 MG/ML  SOLN COMPARISON:  PET-CT 05/13/2018. CT abdomen, and Pelvis 03/03/2019. FINDINGS: Urinary Tract: Foley catheter in place. Urinary bladder decompressed. Visible ureters appear normal. Bowel:  Retained stool in the distal large bowel including a 7 centimeter stool ball in the rectum. Mild presacral stranding. No pelvic free fluid. Visible large bowel loops are nondilated. Negative appendix. Vascular/Lymphatic: The visible Major arterial structures and central pelvic veins appear to be enhancing and patent. No lymphadenopathy. Reproductive:  Negative. Other:  No pelvic free fluid. Musculoskeletal: Right inferior pubic ramus region gas containing skin wound on series 3, image 53 is new since the February PET-CT. No organized or drainable fluid collection is associated. The wound extends to near the periosteum of the inferior pubic ramus but there are no CT changes of osteomyelitis identified. Stable visualized osseous structures. Previous left femur ORIF. There is scattered additional patchy subcutaneous stranding including overlying the lower lumbar spine on series 3, image 1. But no other confluent decubitus wound. IMPRESSION: 1. Right inferior pubic ramus region gas containing decubitus wound is new since the February PET-CT and is in proximity to the right inferior pubic ramus but there is no CT evidence of osteomyelitis. 2. Lesser areas of decubitus soft tissue inflammation elsewhere. 3. Stool ball in the rectum, consider fecal impaction. Electronically Signed   By: Genevie Ann M.D.   On: 08/10/2018 02:34   Dg Abd Portable 1v  Result Date: 08/13/2018 CLINICAL DATA:  Constipation EXAM: PORTABLE ABDOMEN - 1 VIEW COMPARISON:  CT abdomen and pelvis Aug 10, 2018 FINDINGS: The rectum appears mildly distended with stool. Elsewhere, there is only fairly modest stool throughout colon. There is no appreciable bowel dilatation or air-fluid level to suggest bowel obstruction. No free air. There is postoperative change in the left proximal femur, incompletely visualized. There is degenerative change throughout the lumbar spine and hip regions. IMPRESSION: Rectum mildly distended with stool. Elsewhere only modest  stool seen. No bowel obstruction or free air demonstrable. Electronically Signed   By: Lowella Grip III M.D.  On: 08/13/2018 17:03    Labs:  Basic Metabolic Panel: Recent Labs  Lab 09/01/18 0640  NA 137  K 3.8  CL 99  CO2 30  GLUCOSE 88  BUN 10  CREATININE 0.95  CALCIUM 8.1*    CBC: Recent Labs  Lab 09/01/18 0640 09/02/18 0611  WBC 3.5* 3.5*  HGB 8.2* 7.4*  HCT 25.7* 23.4*  MCV 91.1 90.3  PLT 112* 125*    CBG: No results for input(s): GLUCAP in the last 168 hours.  Family history.  Mother with hypertension diabetes.  Father with hypertension and CVA as well as diabetes.  Sister with diabetes.  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  Patient has since been discharged to home after short stay at a skilled nursing facility living with her sister and brother-in-law.  Presented 08/10/2018 with severe sacral decubitus ulcer that began in late February gradually worsened since that time with low-grade fever WBC 16,800 and hemoglobin 6.1.  Initial blood culture showed no growth.  Placed on intravenous broad-spectrum antibiotics through 08/17/2018.  CT the pelvis showed right inferior pubic ramus region gas containing decubitus wound but no evidence of osteomyelitis.  Underwent bedside debridement per general surgery with hydrotherapy.  Hospital course pain management.  She remained on Xarelto as prior to admission.  Completed a course of Rocephin for Klebsiella UTI.  Patient was admitted for a comprehensive rehab program  Hospital Course: Sarah [name] was admitted to rehab 08/17/2018 for inpatient therapies to consist of PT, ST and OT at least  three hours five days a week. Past admission physiatrist, therapy team and rehab RN have worked together to provide customized collaborative inpatient rehab.  Pertaining to patient paraplegia debility with sacral decubitus.  Patient was receiving a wound VAC for sacral decubitus change every Monday Wednesday Friday as well as Santyl and hydrotherapy.  Slow overall progress with therapies.  Metastatic Hurthle cancer followed by oncology services.  Chronic Xarelto ongoing for history of pulmonary emboli no bleeding episodes.  Pain management use of scheduled baclofen titrated as needed with oxycodone for breakthrough pain.  Mood stabilization with Zoloft and neuropsychology follow-up.  Acute on chronic anemia 8.2.  Neurogenic bowel bladder completed a course of antibiotic for Klebsiella UTI.  Patient routine toileting schedule for voiding post void residual is good she was voiding independently with in and out catheterizations if needed.  Hormone supplement for hypothyroidism.  Physical exam.  Blood pressure 128/81 pulse 64 temperature 98.2 respirations 20 oxygen saturations 98% room air Constitutional.  Alert and oriented HEENT Head.  Normocephalic and atraumatic Eyes.  Pupils round and reactive to light without discharge Neck.  Supple nontender no JVD no tracheal deviation Cardiovascular normal rate and rhythm no friction rub or murmur heard Respiratory.  Effort normal no respiratory distress no wheezes no rails Musculoskeletal.  General edema lower extremities Neurological.  Upper extremities 4 out of 5 lower extremities 1-2 out of 5 proximal to distal with extensor tone DTRs 3+ Skin.  Ongoing fibronecrotic debris present but improving  Rehab course: During patient's stay in rehab weekly team conferences were held to monitor patient's progress, set goals and discuss barriers to discharge. At admission, patient required moderate assist sit  to side-lying.  Moderate assist lateral scoot transfers, plus  to general bed mobility.  Minimal assist upper body bathing max assist lower body bathing and dressing total assist lower body dressing  She  has had improvement in activity tolerance, balance, postural control as well as ability to compensate for deficits./She has had improvement in functional use RUE/LUE  and RLE/LLE as well as improvement in awareness.  Supine to sit transfers with moderate assist and moderate cues.  Sitting balance edge of bed x5 minutes with supervision.  Sliding board transfers to wheelchair with max assist.  Wheelchair mobility through tight spaces of room with supervision.  Patient needed total assist to put her shoes on.  She managed hospital bed functions independently.  Max assist to transfer to edge of bed.  Due to limited support at home she was discharged to skilled nursing facility       Disposition: Discharged to skilled nursing facility There are no questions and answers to display.         Diet: Regular  Special Instructions: Apply piece of aquasel to left ischio wound daily then cover with foam dressing change foam dressing every 3 days as needed soiling  Continue wound VAC sacral decubitus with changes every Monday Wednesday Friday.  Cleanse wound to right ischial tuberosity with normal saline.  Fill wound depth with one-piece black foam and bridged to hip to reduce pressure from track pad.  Use to barrier rings around right wound to attempt to maintain a seal  Routine toileting for bladder and in and out catheterizations every 6 hours as needed if no void   Medications at discharge. 1 Tylenol as needed 2.  Baclofen 20 mg p.o. 3 times daily 3.  Dulcolax suppository daily 4.  Voltaren gel 1% 3 times a day to affected area 5.  Ferrous sulfate 325 mg p.o. every 48 hours 6.  Florinef 0.1 mg p.o. daily 7.  Synthroid 150 mcg p.o. daily 8.  Claritin 10 mg p.o. daily 9.  Multivitamin 1 p.o. daily 10.  Oxycodone 5 mg every 6 hours as needed pain 11.   Protonix 40 mg p.o. daily 12.  MiraLAX daily 13.  Xarelto 20 mg p.o. daily 14.  Zoloft 50 mg p.o. daily  Discharge Instructions    Ambulatory referral to Physical Medicine Rehab   Complete by: As directed    Follow-up 1 month paraplegia patient is scheduled for skilled nursing facility placement       Contact information for follow-up providers    [name], MD Follow up.   Specialty: Physical Medicine and Rehabilitation Why: Office to call for appointment Contact information: [address] [place] [address] [phone]        Rolm Bookbinder, MD Follow up.   Specialty: General Surgery Why: Call for appointment as needed Contact information: [place] [address] [phone]            Contact information for after-discharge care    [facility] .   Service: Skilled Nursing Contact information: [address] [place] [phone]                  Signed: Cathlyn Parsons 09/02/2018, 11:47 AM

## 2018-09-02 NOTE — Progress Notes (Signed)
Occupational Therapy Session Note  Patient Details  Name: Sarah [name] MRN: [id number] Date of Birth: [birth year]  Today's Date: 09/02/2018 OT Individual Time: 7445-1460 OT Individual Time Calculation (min): 60 min    Short Term Goals: Week 2:  OT Short Term Goal 1 (Week 2): STG=LTG due to LOS  Skilled Therapeutic Interventions/Progress Updates:    Pt seen for OT session focusing on functional mobility, directing care and activity engagement. Pt in supine upon arrival, denying pain and agreeable to tx session. RN made awware of wound VAC canister to be changed, completed at start of session.  Shoes donned total A in supine. She managed hospital bed functions independently and directed caregiver with how to assist with transfer to EOB with mod questioning cues from therapist. MAx A to transfer to EOB. She tolerated unsupported sitting EOB ~2-3 minutes with supervision and alternating UE support.  Mod-max A sliding board transfer to power w/c.  She completed grooming tasks from w/c level at sink mod I.  Worked on pt folding laundry and putting away clean clothes, requiring B UE use, anterior and R/L weight shift and pt  Engagement in meaningful activity of piecing outfits together as wells facilitating pt being able to direct care with choosing her outfits.  Pt required min questioning cues to initiate pressure reli during extended time up in chair during session. Pt left in power w/c at end of session, cont to educate and stress importance of pressure relief schedule.   Therapy Documentation Precautions:  Precautions Precautions: Fall Precaution Comments: stage 4 sacral wound Restrictions Weight Bearing Restrictions: No   Therapy/Group: Individual Therapy  Tsering Leaman L 09/02/2018, 7:02 AM

## 2018-09-02 NOTE — Progress Notes (Signed)
Physical Therapy Session Note  Patient Details  Name: Sarah [name] MRN: [id number] Date of Birth: 08/03/1965  Today's Date: 09/02/2018 PT Individual Time: 1135-1230 PT Individual Time Calculation (min): 55 min   Short Term Goals: Week 2:  PT Short Term Goal 1 (Week 2): =LTG due to ELOS  Skilled Therapeutic Interventions/Progress Updates:     Patient in power chair upon PT arrival. . Patient alert and agreeable to PT session. Patient participated in co-treatment with recreational therapy for 45 minutes of session.  Session focused on slide board transfers, dynamic sitting balance EOM reaching outside BOS in all directions, discharge planning & self advocacy.  Pt Mod I for w/c mobility using power w/c on the unit 100 feet x2, performed slide board transfers with mod assist x2 with set up assist for w/c and board placement.  Pt sat EOM for cornhole game with min-CGA assist for 40 min with 1 seated rest break with back support.  Pt performed lateral leans, forward leans with verbal cues for encouragement as pt is fearful of falling.  Discussed potential discharge to SNF tomorrow and encouraged self advocacy/directing her care.  Pt stated understanding.  Patient in power chair in her room at end of session with breaks locked and all needs within reach.    Therapy Documentation Precautions:  Precautions Precautions: Fall Precaution Comments: stage 4 sacral wound Restrictions Weight Bearing Restrictions: No Pain: Patient denied pain throughout session.    Therapy/Group: Individual Therapy 10 min and Co-Treatment 45 min  Braylee Bosher L Doretta Remmert PT, DPT  09/02/2018, 1:25 PM

## 2018-09-02 NOTE — Progress Notes (Signed)
Social Work Patient ID: Sarah [name], female   DOB: 1965/05/12, 53 y.o.   MRN: [id number]   Have received SNF bed offer from [facility] in [place] who can admit pt tomorrow. MD states pt medically cleared to d/c.  Pt has accepted bed offer.  SNF to contact [employer] to order [facility] and submit coverage request from insurance.  Will plan for d/c tomorrow if all pieces in place.  Tx team aware.  Sueellen Kayes, LCSW

## 2018-09-02 NOTE — Progress Notes (Signed)
Recreational Therapy Session Note  Patient Details  Name: Sarah [name] MRN: [id number] Date of Birth: 1965/05/04 Today's Date: 09/02/2018 Time:  1130-1215 Pain:no c/o Skilled Therapeutic Interventions/Progress Updates: Session focused on slide board transfers, dynamic sitting balance EOM reaching outside BOS in all directions, discharge planning & self advocacy.  Pt Mod I for w/c mobility using power w/c on the unit, performed slide board transfers with mod assist.  Pt sat EOM for cornhole game with min-mod assist.  Pt performed lateral leans, forward leans with verbal cues for encouragement as pt is fearful of falling.  Discussed potential discharge to SNF tomorrow and encouraged self advocacy/directing her care.  Pt stated understanding.  Therapy/Group: Co-Treatment  Laverle Pillard 09/02/2018, 12:54 PM

## 2018-09-02 NOTE — Progress Notes (Signed)
[place] PHYSICAL MEDICINE & REHABILITATION PROGRESS NOTE   Subjective/Complaints: Pt continues to struggle with spasms. Tolerating increased baclofen.   ROS: Patient denies fever, rash, sore throat, blurred vision, nausea, vomiting, diarrhea, cough, shortness of breath or chest pain, joint or back pain, headache, or mood change.   Objective:   No results found. Recent Labs    09/01/18 0640 09/02/18 0611  WBC 3.5* 3.5*  HGB 8.2* 7.4*  HCT 25.7* 23.4*  PLT 112* 125*   Recent Labs    09/01/18 0640  NA 137  K 3.8  CL 99  CO2 30  GLUCOSE 88  BUN 10  CREATININE 0.95  CALCIUM 8.1*    Intake/Output Summary (Last 24 hours) at 09/02/2018 1157 Last data filed at 09/02/2018 0906 Gross per 24 hour  Intake 600 ml  Output -  Net 600 ml     Physical Exam: Vital Signs Blood pressure 111/71, pulse 74, temperature 98.6 F (37 C), temperature source Oral, resp. rate 18, height 5\' 11"  (1.803 m), weight 95.7 kg, last menstrual period 10/22/2016, SpO2 100 %. Constitutional: No distress . Vital signs reviewed. obese HEENT: EOMI, oral membranes moist Neck: supple Cardiovascular: RRR without murmur. No JVD    Respiratory: CTA Bilaterally without wheezes or rales. Normal effort    GI: BS +, non-tender, non-distended  Musc: Lower extremity edema. Pain at left [facility] jt Neurological: Somnolent Follows full commands.  Motor: Bilateral upper extremities: Grossly 4/5 proximal to distal, stable.  Bilateral lower extremities: 0/5 proximal to distal-no changes today.  extensor tone is 1-3/4 and worsens with any PROM. Sensation absent to light touch Skin: Skin iswarm. She isnot diaphoretic. Vac on buttocks/sealed. Psychiatric: pleasant and cooperative    Assessment/Plan: 1. Functional deficits secondary to debility related to decubitus ulcers/UTI in setting of thoracic paraplegia which require 3+ hours per day of interdisciplinary therapy in a comprehensive inpatient rehab  setting.  Physiatrist is providing close team supervision and 24 hour management of active medical problems listed below.  Physiatrist and rehab team continue to assess barriers to discharge/monitor patient progress toward functional and medical goals  Care Tool:  Bathing    Body parts bathed by patient: Right arm, Left arm, Chest, Abdomen, Face, Right upper leg, Left upper leg, Right lower leg, Left lower leg, Front perineal area   Body parts bathed by helper: Buttocks Body parts n/a: Buttocks(Sacral wounds with dressings)   Bathing assist Assist Level: Minimal Assistance - Patient > 75%     Upper Body Dressing/Undressing Upper body dressing   What is the patient wearing?: Pull over shirt    Upper body assist Assist Level: Set up assist    Lower Body Dressing/Undressing Lower body dressing      What is the patient wearing?: Incontinence brief, Pants     Lower body assist Assist for lower body dressing: Moderate Assistance - Patient 50 - 74%     Toileting Toileting Toileting Activity did not occur Landscape architect and hygiene only): Safety/medical concerns  Toileting assist Assist for toileting: Dependent - Patient 0%     Transfers Chair/bed transfer  Transfers assist     Chair/bed transfer assist level: Moderate Assistance - Patient 50 - 74%(slide board)     Locomotion Ambulation   Ambulation assist   Ambulation activity did not occur: N/A          Walk 10 feet activity   Assist  Walk 10 feet activity did not occur: N/A        Walk  50 feet activity   Assist Walk 50 feet with 2 turns activity did not occur: N/A         Walk 150 feet activity   Assist Walk 150 feet activity did not occur: N/A         Walk 10 feet on uneven surface  activity   Assist Walk 10 feet on uneven surfaces activity did not occur: N/A         Wheelchair     Assist Will patient use wheelchair at discharge?: Yes Type of Wheelchair: Power     Wheelchair assist level: Independent Max wheelchair distance: 140ft    Wheelchair 50 feet with 2 turns activity    Assist        Assist Level: Independent   Wheelchair 150 feet activity     Assist     Assist Level: Independent    Medical Problem List and Plan: 1.Debility/paraplegiasecondary to sacral decubitus status post debridement with history of metastaticHurthlecancer followed by Dr.Kale -Continue CIR therapies including PT, OT  -SNF placement tomorrow pending below  -follow up in outpt clinic over the next 4 weeks  2. Antithrombotics: -DVT/anticoagulation:Chronic Xarelto for history of pulmonary emboli -antiplatelet therapy: N/A 3. Pain Management:Baclofen 10 mg  3 times daily, oxycodone as needed  -increased baclofen to 20mg  tid to help with increased tone  -consider tizanidine which she has used before   bp more robust   -left AC jt, likely OA, ---voltaren gel 4. Mood:Zoloft 50 mg daily- appreciate Neuropsych note  -antipsychotic agents: N/A 5. Neuropsych: This patientiscapable of making decisions on herown behalf. 6. Skin/Wound Care:Continuesantyl andhydrotherapy per PT   -wound vac with change M/W/F per [place]  7. Fluids/Electrolytes/Nutrition: encourage PO 8.Acute on chronic anemia.   Hemoglobin down to 7.4 today, consider transfusion again. Has had GI evaluations previously, likely multifactorial from illness, sacral wound/vac, etc.   -heme check stool  -transfusion threshold for her has been less than 7  -recheck Tomorrow  -fe++ supp 9.Neurogenic bowel and bladderas well as Klebsiella UTI.   Establish bowel program in evening, getting closer  -I/O cath schedule   -Completed course of Rocephin 10. Hypothyroidism. Synthroid 11. Hypotension:  Vitals:   09/01/18 2001 09/02/18 0409  BP: 112/72 111/71  Pulse: 70 74  Resp: 16 18  Temp: 98 F (36.7 C) 98.6 F (37 C)  SpO2: 100% 100%     -likely autonomic  -baclofen resumed as above   -holding lyrica  -  thyroid studies reviewed and WNL  -encourage fluids  -continue florinef trial  Echo   from 2019 EF of 55 to 60%      12. .  CKD stage III- avoid nephrotoxic meds   Creatinine down to 0.95 LOS: 16 days A FACE TO FACE EVALUATION WAS PERFORMED  [name] 09/02/2018, 11:57 AM

## 2018-09-02 NOTE — Progress Notes (Signed)
Wound vac canister full- changed.  Brita Romp, RN

## 2018-09-02 NOTE — Progress Notes (Signed)
Occupational Therapy Session Note  Patient Details  Name: Sarah [name] MRN: [id number] Date of Birth: [birth year]  Today's Date: 09/02/2018 OT Individual Time: 4492-0100 OT Individual Time Calculation (min): 30 min    Short Term Goals: Week 1:  OT Short Term Goal 1 (Week 1): Pt will tolerate sitting EOB for 5 minutes with CGA in prep for functional task OT Short Term Goal 1 - Progress (Week 1): Met OT Short Term Goal 2 (Week 1): Pt will complete sliding board transfer EOB>w/c with mod A OT Short Term Goal 2 - Progress (Week 1): Progressing toward goal OT Short Term Goal 3 (Week 1): Pt will thread one LE into pants with max A using AE PRN from bed level OT Short Term Goal 3 - Progress (Week 1): Met  Skilled Therapeutic Interventions/Progress Updates:    1:1. Pt received in bed with pain reported in back, but managable and pt willing to participate as able without intervention. Pt reuesting to wash up at bed level. Total A provided for BLE and buttocks. Pt able to wash peri area and UB with set up. MAX A to don front clasp bra and min A for shirt pulling down back. Total A for donning teds and pants at bed level. MIN A for rolling in B directions. Exited session with pt setaed in bed, exit alarm on and call light in reach  Therapy Documentation Precautions:  Precautions Precautions: Fall Precaution Comments: stage 4 sacral wound Restrictions Weight Bearing Restrictions: No General:   Vital Signs:   Pain:   ADL:   Vision   Perception    Praxis   Exercises:   Other Treatments:     Therapy/Group: Individual Therapy  Tonny Branch 09/02/2018, 8:16 AM

## 2018-09-02 NOTE — Discharge Instructions (Signed)
Inpatient Rehab Discharge Instructions  Sarah [name] Discharge date and time: No discharge date for patient encounter.   Activities/Precautions/ Functional Status: Activity: activity as tolerated Diet: regular diet Wound Care: keep wound clean and dry Functional status:  ___ No restrictions     ___ Walk up steps independently ___ 24/7 supervision/assistance   ___ Walk up steps with assistance ___ Intermittent supervision/assistance  ___ Bathe/dress independently ___ Walk with walker     _x__ Bathe/dress with assistance ___ Walk Independently    ___ Shower independently ___ Walk with assistance    ___ Shower with assistance ___ No alcohol     ___ Return to work/school ________  Special Instructions: No smoking or alcohol   My questions have been answered and I understand these instructions. I will adhere to these goals and the provided educational materials after my discharge from the hospital.  Patient/Caregiver Signature _______________________________ Date __________  Clinician Signature _______________________________________ Date __________  Please bring this form and your medication list with you to all your follow-up doctor's appointments.       Information on my medicine - XARELTO (rivaroxaban)  WHY WAS XARELTO PRESCRIBED FOR YOU? Xarelto was prescribed to treat blood clots that may have been found in the veins of your legs (deep vein thrombosis) or in your lungs (pulmonary embolism) and to reduce the risk of them occurring again.  What do you need to know about Xarelto? The dose is one 20 mg tablet taken ONCE A DAY with your evening meal.  DO NOT stop taking Xarelto without talking to the health care provider who prescribed the medication.  Refill your prescription for 20 mg tablets before you run out.  After discharge, you should have regular check-up appointments with your healthcare provider that is prescribing your Xarelto.  In the future your dose may  need to be changed if your kidney function changes by a significant amount.  What do you do if you miss a dose? If you are taking Xarelto TWICE DAILY and you miss a dose, take it as soon as you remember. You may take two 15 mg tablets (total 30 mg) at the same time then resume your regularly scheduled 15 mg twice daily the next day.  If you are taking Xarelto ONCE DAILY and you miss a dose, take it as soon as you remember on the same day then continue your regularly scheduled once daily regimen the next day. Do not take two doses of Xarelto at the same time.   Important Safety Information Xarelto is a blood thinner medicine that can cause bleeding. You should call your healthcare provider right away if you experience any of the following: ? Bleeding from an injury or your nose that does not stop. ? Unusual colored urine (red or dark brown) or unusual colored stools (red or black). ? Unusual bruising for unknown reasons. ? A serious fall or if you hit your head (even if there is no bleeding).  Some medicines may interact with Xarelto and might increase your risk of bleeding while on Xarelto. To help avoid this, consult your healthcare provider or pharmacist prior to using any new prescription or non-prescription medications, including herbals, vitamins, non-steroidal anti-inflammatory drugs (NSAIDs) and supplements.  This website has more information on Xarelto: [email or web address]

## 2018-09-03 ENCOUNTER — Inpatient Hospital Stay (HOSPITAL_COMMUNITY): Payer: Medicare Other

## 2018-09-03 ENCOUNTER — Inpatient Hospital Stay (HOSPITAL_COMMUNITY): Payer: Medicare Other | Admitting: Occupational Therapy

## 2018-09-03 DIAGNOSIS — Z86711 Personal history of pulmonary embolism: Secondary | ICD-10-CM | POA: Diagnosis not present

## 2018-09-03 DIAGNOSIS — L89214 Pressure ulcer of right hip, stage 4: Secondary | ICD-10-CM | POA: Diagnosis not present

## 2018-09-03 DIAGNOSIS — L89610 Pressure ulcer of right heel, unstageable: Secondary | ICD-10-CM | POA: Diagnosis not present

## 2018-09-03 DIAGNOSIS — C73 Malignant neoplasm of thyroid gland: Secondary | ICD-10-CM | POA: Diagnosis not present

## 2018-09-03 DIAGNOSIS — G8221 Paraplegia, complete: Secondary | ICD-10-CM | POA: Diagnosis not present

## 2018-09-03 DIAGNOSIS — C7931 Secondary malignant neoplasm of brain: Secondary | ICD-10-CM | POA: Diagnosis not present

## 2018-09-03 DIAGNOSIS — R5381 Other malaise: Secondary | ICD-10-CM | POA: Diagnosis not present

## 2018-09-03 DIAGNOSIS — D34 Benign neoplasm of thyroid gland: Secondary | ICD-10-CM | POA: Diagnosis not present

## 2018-09-03 DIAGNOSIS — N3000 Acute cystitis without hematuria: Secondary | ICD-10-CM | POA: Diagnosis not present

## 2018-09-03 DIAGNOSIS — M6281 Muscle weakness (generalized): Secondary | ICD-10-CM | POA: Diagnosis not present

## 2018-09-03 DIAGNOSIS — T83198A Other mechanical complication of other urinary devices and implants, initial encounter: Secondary | ICD-10-CM | POA: Diagnosis not present

## 2018-09-03 DIAGNOSIS — I951 Orthostatic hypotension: Secondary | ICD-10-CM | POA: Diagnosis not present

## 2018-09-03 DIAGNOSIS — L89303 Pressure ulcer of unspecified buttock, stage 3: Secondary | ICD-10-CM | POA: Diagnosis not present

## 2018-09-03 DIAGNOSIS — Z960 Presence of urogenital implants: Secondary | ICD-10-CM | POA: Diagnosis not present

## 2018-09-03 DIAGNOSIS — L089 Local infection of the skin and subcutaneous tissue, unspecified: Secondary | ICD-10-CM | POA: Diagnosis not present

## 2018-09-03 DIAGNOSIS — Z85850 Personal history of malignant neoplasm of thyroid: Secondary | ICD-10-CM | POA: Diagnosis not present

## 2018-09-03 DIAGNOSIS — N390 Urinary tract infection, site not specified: Secondary | ICD-10-CM | POA: Diagnosis not present

## 2018-09-03 DIAGNOSIS — L89159 Pressure ulcer of sacral region, unspecified stage: Secondary | ICD-10-CM | POA: Diagnosis not present

## 2018-09-03 DIAGNOSIS — Z4800 Encounter for change or removal of nonsurgical wound dressing: Secondary | ICD-10-CM | POA: Diagnosis not present

## 2018-09-03 DIAGNOSIS — L89224 Pressure ulcer of left hip, stage 4: Secondary | ICD-10-CM | POA: Diagnosis not present

## 2018-09-03 DIAGNOSIS — N184 Chronic kidney disease, stage 4 (severe): Secondary | ICD-10-CM | POA: Diagnosis not present

## 2018-09-03 DIAGNOSIS — I10 Essential (primary) hypertension: Secondary | ICD-10-CM | POA: Diagnosis not present

## 2018-09-03 DIAGNOSIS — K5900 Constipation, unspecified: Secondary | ICD-10-CM | POA: Diagnosis not present

## 2018-09-03 DIAGNOSIS — N319 Neuromuscular dysfunction of bladder, unspecified: Secondary | ICD-10-CM | POA: Diagnosis not present

## 2018-09-03 DIAGNOSIS — L89319 Pressure ulcer of right buttock, unspecified stage: Secondary | ICD-10-CM | POA: Diagnosis not present

## 2018-09-03 DIAGNOSIS — L89324 Pressure ulcer of left buttock, stage 4: Secondary | ICD-10-CM | POA: Diagnosis not present

## 2018-09-03 DIAGNOSIS — R279 Unspecified lack of coordination: Secondary | ICD-10-CM | POA: Diagnosis not present

## 2018-09-03 DIAGNOSIS — D649 Anemia, unspecified: Secondary | ICD-10-CM | POA: Diagnosis not present

## 2018-09-03 DIAGNOSIS — Z79899 Other long term (current) drug therapy: Secondary | ICD-10-CM | POA: Diagnosis not present

## 2018-09-03 DIAGNOSIS — D631 Anemia in chronic kidney disease: Secondary | ICD-10-CM | POA: Diagnosis not present

## 2018-09-03 DIAGNOSIS — E039 Hypothyroidism, unspecified: Secondary | ICD-10-CM | POA: Diagnosis not present

## 2018-09-03 DIAGNOSIS — J189 Pneumonia, unspecified organism: Secondary | ICD-10-CM | POA: Diagnosis not present

## 2018-09-03 DIAGNOSIS — Y69 Unspecified misadventure during surgical and medical care: Secondary | ICD-10-CM | POA: Diagnosis not present

## 2018-09-03 DIAGNOSIS — T83031A Leakage of indwelling urethral catheter, initial encounter: Secondary | ICD-10-CM | POA: Diagnosis present

## 2018-09-03 DIAGNOSIS — G8220 Paraplegia, unspecified: Secondary | ICD-10-CM | POA: Diagnosis not present

## 2018-09-03 DIAGNOSIS — R601 Generalized edema: Secondary | ICD-10-CM | POA: Diagnosis not present

## 2018-09-03 DIAGNOSIS — T83091A Other mechanical complication of indwelling urethral catheter, initial encounter: Secondary | ICD-10-CM | POA: Diagnosis not present

## 2018-09-03 DIAGNOSIS — T83098A Other mechanical complication of other indwelling urethral catheter, initial encounter: Secondary | ICD-10-CM | POA: Diagnosis not present

## 2018-09-03 DIAGNOSIS — M2550 Pain in unspecified joint: Secondary | ICD-10-CM | POA: Diagnosis not present

## 2018-09-03 DIAGNOSIS — K592 Neurogenic bowel, not elsewhere classified: Secondary | ICD-10-CM | POA: Diagnosis not present

## 2018-09-03 DIAGNOSIS — Z5181 Encounter for therapeutic drug level monitoring: Secondary | ICD-10-CM | POA: Diagnosis not present

## 2018-09-03 DIAGNOSIS — Z7409 Other reduced mobility: Secondary | ICD-10-CM | POA: Diagnosis not present

## 2018-09-03 DIAGNOSIS — Z7401 Bed confinement status: Secondary | ICD-10-CM | POA: Diagnosis not present

## 2018-09-03 LAB — HEMOGLOBIN AND HEMATOCRIT, BLOOD
HCT: 25 % — ABNORMAL LOW (ref 36.0–46.0)
Hemoglobin: 7.8 g/dL — ABNORMAL LOW (ref 12.0–15.0)

## 2018-09-03 NOTE — Progress Notes (Signed)
[place] PHYSICAL MEDICINE & REHABILITATION PROGRESS NOTE   Subjective/Complaints: No new issues. Having spasms still esp when moving or manipulating pelvic area  ROS: Patient denies fever, rash, sore throat, blurred vision, nausea, vomiting, diarrhea, cough, shortness of breath or chest pain, joint or back pain, headache, or mood change.    Objective:   No results found. Recent Labs    09/01/18 0640 09/02/18 0611 09/03/18 0734  WBC 3.5* 3.5*  --   HGB 8.2* 7.4* 7.8*  HCT 25.7* 23.4* 25.0*  PLT 112* 125*  --    Recent Labs    09/01/18 0640  NA 137  K 3.8  CL 99  CO2 30  GLUCOSE 88  BUN 10  CREATININE 0.95  CALCIUM 8.1*    Intake/Output Summary (Last 24 hours) at 09/03/2018 1108 Last data filed at 09/03/2018 7564 Gross per 24 hour  Intake 480 ml  Output -  Net 480 ml     Physical Exam: Vital Signs Blood pressure 111/68, pulse 78, temperature 98.7 F (37.1 C), temperature source Oral, resp. rate 19, height 5\' 11"  (1.803 m), weight 95.7 kg, last menstrual period 10/22/2016, SpO2 99 %. Constitutional: No distress . Vital signs reviewed. HEENT: EOMI, oral membranes moist Neck: supple Cardiovascular: RRR without murmur. No JVD    Respiratory: CTA Bilaterally without wheezes or rales. Normal effort    GI: BS +, non-tender, non-distended   Musc: Lower extremity edema. Pain at left [facility] jt persistent Neurological: alert Follows full commands.  Motor: Bilateral upper extremities: Grossly 4/5 proximal to distal, stable.  Bilateral lower extremities: 0/5 proximal to distal-no changes today.  extensor tone is 1-3/4 seem to be sl less today  Sensation absent to light touch Skin: Skin iswarm. She isnot diaphoretic. Vac on buttocks is sealed. Psychiatric: pleasant and cooperative    Assessment/Plan: 1. Functional deficits secondary to debility related to decubitus ulcers/UTI in setting of thoracic paraplegia which require 3+ hours per day of interdisciplinary  therapy in a comprehensive inpatient rehab setting.  Physiatrist is providing close team supervision and 24 hour management of active medical problems listed below.  Physiatrist and rehab team continue to assess barriers to discharge/monitor patient progress toward functional and medical goals  Care Tool:  Bathing    Body parts bathed by patient: Right arm, Left arm, Chest, Abdomen, Face, Right upper leg, Left upper leg, Right lower leg, Left lower leg, Front perineal area   Body parts bathed by helper: Buttocks Body parts n/a: Buttocks(Sacral wounds with dressings)   Bathing assist Assist Level: Minimal Assistance - Patient > 75%     Upper Body Dressing/Undressing Upper body dressing   What is the patient wearing?: Pull over shirt    Upper body assist Assist Level: Set up assist    Lower Body Dressing/Undressing Lower body dressing      What is the patient wearing?: Incontinence brief, Pants     Lower body assist Assist for lower body dressing: Minimal Assistance - Patient > 75%(Assist for managing LEs at bed level)     Toileting Toileting Toileting Activity did not occur (Clothing management and hygiene only): Safety/medical concerns  Toileting assist Assist for toileting: Dependent - Patient 0%     Transfers Chair/bed transfer  Transfers assist     Chair/bed transfer assist level: Moderate Assistance - Patient 50 - 74%     Locomotion Ambulation   Ambulation assist   Ambulation activity did not occur: N/A          Walk  10 feet activity   Assist  Walk 10 feet activity did not occur: N/A        Walk 50 feet activity   Assist Walk 50 feet with 2 turns activity did not occur: N/A         Walk 150 feet activity   Assist Walk 150 feet activity did not occur: N/A         Walk 10 feet on uneven surface  activity   Assist Walk 10 feet on uneven surfaces activity did not occur: N/A         Wheelchair     Assist Will  patient use wheelchair at discharge?: Yes Type of Wheelchair: Power    Wheelchair assist level: Independent, Set up assist Max wheelchair distance: 100'    Wheelchair 50 feet with 2 turns activity    Assist        Assist Level: Independent, Set up assist   Wheelchair 150 feet activity     Assist     Assist Level: Independent    Medical Problem List and Plan: 1.Debility/paraplegiasecondary to sacral decubitus status post debridement with history of metastaticHurthlecancer followed by Dr.Kale -Continue CIR therapies including PT, OT  -SNF placement today  -follow up in outpt clinic over the next 4 weeks  2. Antithrombotics: -DVT/anticoagulation:Chronic Xarelto for history of pulmonary emboli -antiplatelet therapy: N/A 3. Pain Management:Baclofen 10 mg  3 times daily, oxycodone as needed  -increased baclofen to 20mg  tid to help with increased tone  -consider tizanidine as outpt   bp more robust   -left AC jt, likely OA, ---voltaren gel 4. Mood:Zoloft 50 mg daily- appreciate Neuropsych note  -antipsychotic agents: N/A 5. Neuropsych: This patientiscapable of making decisions on herown behalf. 6. Skin/Wound Care:Continuesantyl andhydrotherapy per PT   -wound vac with change M/W/F per [place]  7. Fluids/Electrolytes/Nutrition: encourage PO 8.Acute on chronic anemia related to illness, decubiti , etc  Hemoglobin 7.8 today    - transfusion threshold for her has been less than 7  -will need follow up of hgb at SNF  -continue fe++ supp  -discussed importance of nutrition and fe++ supp with patient 9.Neurogenic bowel and bladderas well as Klebsiella UTI.   Establish bowel program in evening, getting closer  -I/O cath schedule   -Completed course of Rocephin 10. Hypothyroidism. Synthroid 11. Hypotension:  Vitals:   09/02/18 1926 09/03/18 0528  BP: (!) 89/46 111/68  Pulse: 68 78  Resp: 18 19  Temp: 98.1 F  (36.7 C) 98.7 F (37.1 C)  SpO2: (!) 87% 99%    -likely autonomic  -baclofen resumed as above   -holding lyrica  -  thyroid studies reviewed and WNL  -encourage fluids  -continue florinef trial  Echo   from 2019 EF of 55 to 60%      12. .  CKD stage III- avoid nephrotoxic meds   Creatinine down to 0.95 LOS: 17 days A FACE TO FACE EVALUATION WAS PERFORMED  [name] 09/03/2018, 11:08 AM

## 2018-09-03 NOTE — Progress Notes (Signed)
Occupational Therapy Discharge Summary  Patient Details  Name: Sarah [name] MRN: [id number] Date of Birth: August 30, 1965    Patient has met 5 of 6 long term goals due to improved activity tolerance, improved balance and postural control.  Patient to discharge at [facility] Max Assist level.  Patient's care partner unavailable to provide the necessary physical assistance at discharge and therefore pt to d/c to SNF in order to cont to reduce burden of care.  Pt completing LB bathing/dressing tasks from bed level. Requires min A for management of B LEs iin order to reach to wash and using reacher to assist with threading pants onto LEs.  UB bathing/dressing completed from w/c level with set-up assist.  Pt is incontinent of urine without awarenes, on PM bowel program and completing toileting task total A at bed level. Sliding board transfers with mod-max A.  Extensive education regarding importance of pressure relief schedule, directing care and self-advocacy in regards to ownership of her care.  Reasons goals not met: Pt requires min A for functional dynamic sitting balance. She can sit EOB with supervision with B UE support, however, without UE support requires min A for balance during functional unsupported sitting activity.   Recommendation:  Patient will benefit from ongoing skilled OT services in skilled nursing facility setting to continue to advance functional skills in the area of BADL and Reduce care partner burden.  Equipment: No equipment provided  Reasons for discharge: treatment goals met and discharge from hospital  Patient/family agrees with progress made and goals achieved: Yes  OT Discharge Precautions/Restrictions  Precautions Precautions: Fall Precaution Comments: stage 4 sacral wound; wound vac Restrictions Weight Bearing Restrictions: No Vision Baseline Vision/History: Wears glasses Wears Glasses: Reading only Patient Visual Report: No change from baseline Vision  Assessment?: No apparent visual deficits Perception  Perception: Within Functional Limits Praxis Praxis: Intact Cognition Overall Cognitive Status: Within Functional Limits for tasks assessed Arousal/Alertness: Awake/alert Orientation Level: Oriented X4 Focused Attention: Appears intact Memory: Appears intact Awareness: Appears intact Problem Solving: Appears intact Safety/Judgment: Appears intact Sensation Sensation Light Touch: Impaired Detail Peripheral sensation comments: T-3 paraplegia Light Touch Impaired Details: Absent RLE;Absent LLE Proprioception: Impaired Detail Proprioception Impaired Details: Absent RLE;Absent LLE Coordination Gross Motor Movements are Fluid and Coordinated: No(T-3 Paraplegia) Fine Motor Movements are Fluid and Coordinated: Yes Motor  Motor Motor: Paraplegia Motor - Skilled Clinical Observations: T3 paraplegia Trunk/Postural Assessment  Cervical Assessment Cervical Assessment: Exceptions to WFL(Forward head) Thoracic Assessment Thoracic Assessment: Exceptions to WFL(Rounded shoulders; kyphotic) Lumbar Assessment Lumbar Assessment: Exceptions to WFL(Posterior pelvic tilt) Postural Control Postural Control: Deficits on evaluation Trunk Control: Impaired Protective Responses: Insufficient  Balance Balance Balance Assessed: Yes Static Sitting Balance Static Sitting - Balance Support: Feet supported;Bilateral upper extremity supported Static Sitting - Level of Assistance: 5: Stand by assistance Static Sitting - Comment/# of Minutes: Sitting EOB/EOM Dynamic Sitting Balance Dynamic Sitting - Balance Support: During functional activity;Feet supported;Right upper extremity supported;Left upper extremity supported Dynamic Sitting - Level of Assistance: 4: Min assist;3: Mod assist Sitting balance - Comments: decreased core strength and stability to sit independently at EOB without UE support. Extremity/Trunk Assessment RUE Assessment RUE  Assessment: Exceptions to [facility] Active Range of Motion (AROM) Comments: ~3/4 full ROM General Strength Comments: 4/5 throughout LUE Assessment LUE Assessment: Exceptions to [facility] Active Range of Motion (AROM) Comments: ~3/4 AROM shoulder flexion General Strength Comments: 3+/5 thorughout LUE Body System: Ortho(Suspect 2/2 arthritis pain)   Trissa Molina L 09/03/2018, 7:24 AM

## 2018-09-03 NOTE — Progress Notes (Signed)
Social Work  Discharge Note  The overall goal for the admission was met for:   Discharge location: No - plan changed to SNF due to pt's level of care needed.  Length of Stay: Yes - 17 days  Discharge activity level: Yes - min/ mod assist overall  Home/community participation: Yes  Services provided included: MD, RD, PT, OT, RN, TR, Pharmacy, Neuropsych and SW  Financial Services: [facility] Medicare  Follow-up services arranged: Other: SNF placement at [facility]  Comments (or additional information):  Patient/Family verbalized understanding of follow-up arrangements: Yes  Individual responsible for coordination of the follow-up plan: pt  Confirmed correct DME delivered:  NA    Cadee Agro

## 2018-09-03 NOTE — Progress Notes (Signed)
Physical Therapy Discharge Summary  Patient Details  Name: Sarah [name] MRN: [id number] Date of Birth: May 01, 1965   Patient has met 7 of 7 long term goals due to improved activity tolerance, improved balance, improved postural control, decreased pain and ability to compensate for deficits.  Patient to discharge at a wheelchair level Modified Independent for wheelchair mobility, min assist bed mobility, and mod A transfers.   [name] discharging to SNF with full staff available to provide the necessary physical assistance at discharge.  Recommendation:  Patient will benefit from ongoing skilled PT services in skilled nursing facility setting to continue to advance safe functional mobility, address ongoing impairments in balance, strength, activity tolerance, skin integrity, functional mobility, and minimize fall risk.  Equipment: No equipment provided  Reasons for discharge: treatment goals met  Patient/family agrees with progress made and goals achieved: Yes  PT Discharge Precautions/Restrictions Precautions Precautions: Fall Restrictions Weight Bearing Restrictions: No Vital Signs   Pain Pain Assessment Pain Score: 0-No pain Vision/Perception  Perception Perception: Within Functional Limits Praxis Praxis: Intact  Cognition Orientation Level: Oriented X4 Attention: Focused;Sustained Focused Attention: Appears intact Sustained Attention: Appears intact Memory: Appears intact Awareness: Appears intact Problem Solving: Appears intact Safety/Judgment: Appears intact Sensation Sensation Light Touch: Impaired by gross assessment Light Touch Impaired Details: Absent RLE;Absent LLE Proprioception: Impaired by gross assessment Proprioception Impaired Details: Absent LUE;Absent RLE Coordination Gross Motor Movements are Fluid and Coordinated: No Fine Motor Movements are Fluid and Coordinated: No Coordination and Movement Description: Paraplegia of B LEs Motor   Motor Motor: Paraplegia;Abnormal tone Motor - Discharge Observations: Paraplegia with some extensor tone of B LEs  Mobility Bed Mobility Bed Mobility: Rolling Right;Rolling Left;Sit to Supine;Supine to Sit Rolling Right: Minimal Assistance - Patient > 75% Rolling Left: Minimal Assistance - Patient > 75% Supine to Sit: Minimal Assistance - Patient > 75% Sit to Supine: Minimal Assistance - Patient > 75% Transfers Transfers: Lateral/Scoot Transfers Lateral/Scoot Transfers: Moderate Assistance - Patient 50-74% Transfer (Assistive device): Other (Comment)(slide board) Locomotion  Gait Ambulation: No Gait Gait: No Stairs / Additional Locomotion Stairs: No Architect: Yes Wheelchair Assistance: Independent with Camera operator: Power Wheelchair Parts Management: Needs assistance Distance: Independent with arm rest, need assistance with foot plates  Trunk/Postural Assessment  Cervical Assessment Cervical Assessment: Exceptions to WFL(forward head) Thoracic Assessment Thoracic Assessment: Exceptions to WFL(kyphosis) Lumbar Assessment Lumbar Assessment: Exceptions to WFL(posterior pelvic tilt) Postural Control Postural Control: Deficits on evaluation Trunk Control: decreased Protective Responses: delayed  Balance Static Sitting Balance Static Sitting - Balance Support: Bilateral upper extremity supported;Feet supported Static Sitting - Level of Assistance: 5: Stand by assistance Static Sitting - Comment/# of Minutes: 2 minutes Dynamic Sitting Balance Dynamic Sitting - Balance Support: Right upper extremity supported;Left upper extremity supported;Feet unsupported Dynamic Sitting - Level of Assistance: 4: Min assist Dynamic Sitting - Balance Activities: Lateral lean/weight shifting;Forward lean/weight shifting;Reaching for objects;Reaching across midline;Trunk control activities;Reaching for weighted objects Sitting balance -  Comments: reaching for objects at all heights and across midline, bean bag toss sitting EOM Extremity Assessment  RUE Assessment RUE Assessment: Exceptions to [facility] Active Range of Motion (AROM) Comments: 3/4 full range AROM General Strength Comments: Grossly in sitting 4/5 throughout LUE Assessment Passive Range of Motion (PROM) Comments: 3/4 full range shoulder flexion PROM Active Range of Motion (AROM) Comments: 100 degrees shoulder flexion AROM limited by pain General Strength Comments: Grossly in sitting 3/5 shoulder flexion and elbow flexion, 4/5 elbow extension and grip strength RLE Assessment  RLE Assessment: Exceptions to [facility] Passive Range of Motion (PROM) Comments: WFL Active Range of Motion (AROM) Comments: none General Strength Comments: Grossly 0/5 throughtout RLE Strength RLE Overall Strength: Other (Comment)(Paraplegia at baseline from spinal tumor) LLE Assessment LLE Assessment: Exceptions to [facility] Passive Range of Motion (PROM) Comments: WFL Active Range of Motion (AROM) Comments: none General Strength Comments: Grossly 0/5 LLE Strength LLE Overall Strength: Other (Comment)(paraplegia at baseline from spinal tumor)    Kemiya Batdorf L Aalia Greulich PT, DPT  09/03/2018, 12:36 PM

## 2018-09-03 NOTE — Progress Notes (Signed)
Patient discharged to [facility] in [place], [place] via EMS. Report called to [facility], LPN with verbal understanding. Two bags of patient belongings went with patient and family to pick up remaining belongings.

## 2018-09-03 NOTE — Consult Note (Signed)
[facility] Nurse wound consult note Patient receiving care in [facility] [phone].  Reason for Consult: NPWT right ischial dressing.  One piece of black foam removed, one piece of black foam placed.   Wound type: Bone directly palpable.  Approximately 30% of wound has what appears to be superficial grey discoloration. Remainder is either pink, or very deep in the wound, a thick strand of yellow.  I am not sure, perhaps the strand is a ligament or tendon. Measurement: 10.2cm x 9cm x 4.7 cm  Wound bed:as above Drainage (amount, consistency, odor) serosanginous in cannister Periwound: intact. Seal facilitated by use of skin prep pads and barrier rings x 2 placed along the inferior and medial borders Dressing procedure/placement/frequency: MWF NPWT dressing change.  Dressing bridged to right hip. Immediate seal obtained. No evidence of leak. Val Riles, RN, MSN, CWOCN, CNS-BC, pager [phone]

## 2018-09-03 NOTE — Progress Notes (Addendum)
Physical Therapy Session Note  Patient Details  Name: Sarah [name] MRN: [id number] Date of Birth: April 10, 1965  Today's Date: 09/03/2018 PT Individual Time: 8295-6213 PT Individual Time Calculation (min): 57 min   Short Term Goals: Week 1:  PT Short Term Goal 1 (Week 1): Pt will complete bed mobility with mod A PT Short Term Goal 1 - Progress (Week 1): Progressing toward goal PT Short Term Goal 2 (Week 1): Pt will complete least restrictive transfer with mod A PT Short Term Goal 2 - Progress (Week 1): Progressing toward goal PT Short Term Goal 3 (Week 1): Pt will demonstrate ability to perform effective pressure relief in w/c  PT Short Term Goal 3 - Progress (Week 1): Progressing toward goal Week 2:  PT Short Term Goal 1 (Week 2): =LTG due to ELOS  Skilled Therapeutic Interventions/Progress Updates:     Patient in power w/c upon PT arrival. Patient alert and agreeable to PT session.  Therapeutic Activity: Bed Mobility: Patient performed rolling R and L and supine to/from sit with min A for LE and trunk management, requiring increased time for patient to manage trunk with min A. Provided verbal cues for lowering to her elbow then her side from sitting and setting her elbow to push up from her side. Transfers: Patient performed slide board transfers to/from bed to w/c with mod A. Patient was able to direct PT through how to assist her with set up and transfer without cuing from PT.   Wheelchair Mobility:  Patient propelled a power w/c >315feet and up down a ramp with mod I. Patient demonstrated pressure relief in the power chair by tilting chair back x2 min and stated that she performs this every 30 min and that she needs to change position in the bed every hour to 2 hours.   Educated patient on self advocacy/directing her care at [facility].    Patient reported L shoulder discomfort with movement, limited to 100 degrees shoulder flexion with AROM and 3/4 full range PROM without discomfort.  Patient stated that she used to get injections in her shoulder and that she had discussed this with her MD this morning. PT provided shoulder PROM with prolonged hold for 30 seconds at end range x2 with improved discomfort with AROM.     Patient in power at end of session with breaks locked, and all needs within reach.     Therapy Documentation Precautions:  Precautions Precautions: Fall Precaution Comments: stage 4 sacral wound; wound vac Restrictions Weight Bearing Restrictions: No Pain:      Therapy/Group: Individual Therapy  Lawyer Washabaugh L Grantham Hippert PT, DPT  09/03/2018, 1:21 PM

## 2018-09-03 NOTE — Progress Notes (Signed)
Occupational Therapy Session Note  Patient Details  Name: Sarah [name] MRN: [id number] Date of Birth: 1966-03-21  Today's Date: 09/03/2018 OT Individual Time: 4098-1191 OT Individual Time Calculation (min): 60 min    Short Term Goals: Week 2:  OT Short Term Goal 1 (Week 2): STG=LTG due to LOS  Skilled Therapeutic Interventions/Progress Updates:    Pt seen for OT ADL bathing/dressing session. Pt awake in supine upon arrival, agreeable to tx session and desiring to complete bathing/dressing routine.  LB bathing and dressing completed from bed level. Min A for management of LEs to position in modified circle sit position in order for pt to reach to bathe, assist to reach B feet. Changed brief and pt noted to have residual BM and still going, RN performed dig stim and education provided to pt regarding bowel program and importance of emptying in regards to skin care before getting OOB. Pt with increased extensor tone this AM, specifically when donning brief and stimulation at pubic area, MD made aware during AM Sarah [name]. She rolled with min A using hospital bed functions in order for hygiene, brief to be donned and pants pulled up. She transferred to sitting EOB with max A using hospital bed functions. Completed mod A sliding board transfer to w/c.  Pt left seated at sink at end of session to finish UB bathing/dressing and grooming tasks. NT made aware of pt's position and pt with call bell within reach.  Education continuing during session regarding importance of skin care, directing care, pressure relief and reducing caregiver burden and importance of participation with ADLs.   Therapy Documentation Precautions:  Precautions Precautions: Fall Precaution Comments: stage 4 sacral wound Restrictions Weight Bearing Restrictions: No   Therapy/Group: Individual Therapy  Sarah [name] 09/03/2018, 6:57 AM

## 2018-09-04 DIAGNOSIS — C7931 Secondary malignant neoplasm of brain: Secondary | ICD-10-CM | POA: Diagnosis not present

## 2018-09-05 ENCOUNTER — Emergency Department (HOSPITAL_COMMUNITY)
Admission: EM | Admit: 2018-09-05 | Discharge: 2018-09-05 | Disposition: A | Discharge status: Skilled Nursing Facility | Payer: Medicare Other | Attending: Emergency Medicine | Admitting: Emergency Medicine

## 2018-09-05 ENCOUNTER — Encounter (HOSPITAL_COMMUNITY): Payer: Self-pay | Admitting: Emergency Medicine

## 2018-09-05 ENCOUNTER — Other Ambulatory Visit: Payer: Self-pay

## 2018-09-05 DIAGNOSIS — Z4800 Encounter for change or removal of nonsurgical wound dressing: Secondary | ICD-10-CM | POA: Diagnosis not present

## 2018-09-05 DIAGNOSIS — T83098A Other mechanical complication of other indwelling urethral catheter, initial encounter: Secondary | ICD-10-CM | POA: Diagnosis not present

## 2018-09-05 DIAGNOSIS — Y69 Unspecified misadventure during surgical and medical care: Secondary | ICD-10-CM | POA: Insufficient documentation

## 2018-09-05 DIAGNOSIS — Z79899 Other long term (current) drug therapy: Secondary | ICD-10-CM | POA: Insufficient documentation

## 2018-09-05 DIAGNOSIS — Z5189 Encounter for other specified aftercare: Secondary | ICD-10-CM

## 2018-09-05 DIAGNOSIS — Z85850 Personal history of malignant neoplasm of thyroid: Secondary | ICD-10-CM | POA: Diagnosis not present

## 2018-09-05 DIAGNOSIS — N3000 Acute cystitis without hematuria: Secondary | ICD-10-CM | POA: Diagnosis not present

## 2018-09-05 DIAGNOSIS — E039 Hypothyroidism, unspecified: Secondary | ICD-10-CM | POA: Insufficient documentation

## 2018-09-05 DIAGNOSIS — T83091A Other mechanical complication of indwelling urethral catheter, initial encounter: Secondary | ICD-10-CM | POA: Insufficient documentation

## 2018-09-05 DIAGNOSIS — T839XXA Unspecified complication of genitourinary prosthetic device, implant and graft, initial encounter: Secondary | ICD-10-CM

## 2018-09-05 DIAGNOSIS — R279 Unspecified lack of coordination: Secondary | ICD-10-CM | POA: Diagnosis not present

## 2018-09-05 DIAGNOSIS — Z7401 Bed confinement status: Secondary | ICD-10-CM | POA: Diagnosis not present

## 2018-09-05 DIAGNOSIS — T83198A Other mechanical complication of other urinary devices and implants, initial encounter: Secondary | ICD-10-CM | POA: Diagnosis not present

## 2018-09-05 DIAGNOSIS — R5381 Other malaise: Secondary | ICD-10-CM | POA: Diagnosis not present

## 2018-09-05 DIAGNOSIS — T83031A Leakage of indwelling urethral catheter, initial encounter: Secondary | ICD-10-CM | POA: Diagnosis present

## 2018-09-05 LAB — URINALYSIS, ROUTINE W REFLEX MICROSCOPIC
Bilirubin Urine: NEGATIVE
Glucose, UA: NEGATIVE mg/dL
Ketones, ur: NEGATIVE mg/dL
Nitrite: POSITIVE — AB
Protein, ur: NEGATIVE mg/dL
Specific Gravity, Urine: 1.012 (ref 1.005–1.030)
WBC, UA: >50 WBC/hpf — ABNORMAL HIGH (ref 0–5)
pH: 5 (ref 5.0–8.0)

## 2018-09-05 MED ORDER — SULFAMETHOXAZOLE-TRIMETHOPRIM 800-160 MG PO TABS
1.0000 | ORAL_TABLET | Freq: Once | ORAL | Status: AC
  Administered 2018-09-05: 1 via ORAL
  Filled 2018-09-05: qty 1

## 2018-09-05 MED ORDER — SULFAMETHOXAZOLE-TRIMETHOPRIM 800-160 MG PO TABS: 1.0000 | ORAL_TABLET | Freq: Two times a day (BID) | ORAL | 0 refills | Status: AC

## 2018-09-05 NOTE — ED Triage Notes (Signed)
Pt here from [facility] for evaluation of sacral wound. Nurse there states she put a foley in yesterday and it will not drain. Pt states her wound vac was removed today and [facility] could not figure out how to redress it. Nurse was concerned decub has tracked into bladder.

## 2018-09-05 NOTE — ED Provider Notes (Signed)
[facility] EMERGENCY DEPARTMENT Provider Note   CSN: [id number] Arrival date & time: 09/05/18  1809    History   Chief Complaint Chief Complaint  Patient presents with  . Wound Check    HPI Sarah [name] is a 53 y.o. 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  Pt was discharged from [facility] on 09/03/2018 after a long hospitalization for several significant pressure ulcers and a short rehab stay.  Staff at [facility] were unable to reapply pt's wound vac yesterday due to urine therefore a foley catheter was placed.  Staff reports no drainage of urine at foley placement or since that time, but continued drainage of urine into the bed.  Staff denies fever, chills, worsening of the wound.  Pt denies abd pain, abd distension, dysuria, hematuria.  No aggravating or alleviating factors.  Pt reports she did have catheter in place while hospitalized due to wound vac.       The history is provided by the patient and medical records. No language interpreter was used.    Past Medical History:  Diagnosis Date  . Anxiety   . 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 Chronic back pain   . Complication of anesthesia    Nausea and vomiting  . DDD (degenerative disc disease), cervical   . Depression   . DJD (degenerative joint disease)   . Family history of adverse reaction to anesthesia    MOTHER HAS NAUSEA   . GERD (gastroesophageal reflux disease)   . History of blood transfusion    08/2017  . History of rectal fissure   . HIT (heparin-induced thrombocytopenia) ([place])   . Hypertension    05/27/2018- not current- runs low  . Hypothyroidism   . Obesity   . Paraplegia at T4 level ([place])   . Pneumonia 03/2018  . Pulmonary embolus, right ([place]) 2015  . Rotator cuff tendonitis right     Patient Active Problem List   Diagnosis Date Noted  . CKD (chronic kidney disease), stage III ([place])   . Hypoalbuminemia due to protein-calorie malnutrition ([place])   . Hyperglycemia   . Nasal congestion   . Hypotension   . Acute on chronic anemia   . Febrile illness   . Bacterial skin infection 08/10/2018  . Eustachian tube disorder, left 06/30/2018  . Urinary tract infection 06/30/2018  . Malignant neoplasm ([place]) 06/01/2018  . Metastasis to brain ([place]) 06/01/2018  . Transaminitis 04/09/2018  . Hypoalbuminemia 04/09/2018  . Dyspnea   . Multifocal pneumonia   . Palliative care by specialist   . Goals of care, counseling/discussion   . DNR (do not resuscitate) discussion   . Fecal impaction ([place]) 02/06/2018  . HCAP (healthcare-associated pneumonia) 02/06/2018  . Anemia   . Chronic anticoagulation   . Sacral ulcer ([place]) 02/04/2018  . Fracture   . Acute lower UTI   . Myelopathy ([place]) 09/21/2017  . Muscle spasms of both lower extremities   . Closed fracture of shaft of left femur ([place])   . Acute blood loss anemia   . Hypothyroidism   . Nausea & vomiting 09/15/2017  . Closed fracture of left distal femur ([place]) 09/11/2017  . Paraplegia ([place]) 09/08/2017  . Obesity (BMI 30-39.9) 09/03/2017  . Status post surgery 09/03/2017  . Calculus of  gallbladder without cholecystitis without obstruction   . Biliary dyskinesia   . Intractable nausea and vomiting 08/09/2017  . Thrombocytopenia ([place]) 08/09/2017  . Elevated bilirubin 08/09/2017  . AKI (acute kidney injury) ([place]) 08/09/2017  . Chest pain 01/26/2017  . Upper respiratory infection, viral 01/26/2017  . Counseling regarding advanced care planning and goals of care 01/02/2017  . Bilateral rotator cuff syndrome 02/12/2016  . E. coli UTI 08/01/2015  . Constipation due to neurogenic bowel 08/01/2015  . 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 Spastic paraparesis   . Neurogenic bladder   . History of pulmonary embolism   . Post-operative  pain   . Metastatic cancer ([place])   . Steroid-induced hyperglycemia   . Depression   . Obstipation   . Thoracic spine tumor 07/06/2015  . 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 ([place]) 07/06/2015  . Spinal cord compression due to malignant neoplasm metastatic to spine ([place])   . Postoperative hypothyroidism   . 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 Spasticity 09/19/2014  . Hypertension 05/01/2014  . Ingrown right big toenail 05/01/2014  . GERD (gastroesophageal reflux disease) 02/14/2014  . Dysphagia, pharyngoesophageal phase 02/06/2014  . Type 2 diabetes mellitus without complication ([place]) 58/52/7782  . Constipation   . Neurogenic bowel   . Paraplegia at T4 level ([place]) 01/20/2014  . Metastatic cancer to spine ([place]) 01/19/2014  . Rotator cuff tendonitis   . 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 Leg weakness, bilateral 12/13/2013    Past Surgical History:  Procedure Laterality Date  . APPLICATION OF CRANIAL NAVIGATION N/A 06/01/2018   Procedure: APPLICATION OF CRANIAL NAVIGATION;  Surgeon: Erline Levine, MD;  Location: [facility];  Service: Neurosurgery;  Laterality: N/A;  . CHOLECYSTECTOMY N/A 01/25/2018   Procedure: LAPAROSCOPIC CHOLECYSTECTOMY;  Surgeon: Virl Cagey, MD;  Location: [facility];  Service: General;  Laterality: N/A;  . CRANIOTOMY N/A 06/01/2018   Procedure: Craniectomy for skull metastasis with Brainlab;  Surgeon: Erline Levine, MD;  Location: [facility];  Service: Neurosurgery;  Laterality: N/A;  . FEMUR IM NAIL Left 09/11/2017   Procedure: INTRAMEDULLARY (IM) RETROGRADE FEMORAL NAILING;  Surgeon: Altamese Timberlane, MD;  Location: [facility];  Service: Orthopedics;  Laterality: Left;  . LAMINECTOMY N/A 12/14/2013   Procedure: THORACIC LAMINECTOMY FOR TUMOR THORACIC THREE;  Surgeon: Ashok Pall, MD;  Location: [facility];  Service: Neurosurgery;  Laterality: N/A;  THORACIC LAMINECTOMY FOR  TUMOR THORACIC THREE  . LAMINECTOMY N/A 07/05/2015   Procedure: THORACIC LAMINECTOMY FOR TUMOR;  Surgeon: Ashok Pall, MD;  Location: [facility];  Service: Neurosurgery;  Laterality: N/A;  . LAMINECTOMY N/A 09/03/2017   Procedure: POSTERIOR SPINAL TUMOR RESECTION THORACIC THREE;  Surgeon: Ashok Pall, MD;  Location: [facility];  Service: Neurosurgery;  Laterality: N/A;  POSTERIOR SPINAL TUMOR RESECTION THORACIC THREE  . POSTERIOR LUMBAR FUSION 4 LEVEL N/A 12/30/2013   Procedure: Thoracic one-Thoracic five posterior thoracic fusion with pedicle screws;  Surgeon: Ashok Pall, MD;  Location: [facility];  Service: Neurosurgery;  Laterality: N/A;  Thoracic one-Thoracic five posterior thoracic fusion with pedicle screws  . THYROIDECTOMY N/A 01/09/2014   Procedure: TOTAL THYROIDECTOMY;  Surgeon: Izora Gala, MD;  Location: [facility];  Service: ENT;  Laterality: N/A;  . TONSILLECTOMY       OB History    Gravida      Para      Term      Preterm  AB      Living  0     SAB      TAB      Ectopic      Multiple      Live Births               Home Medications    Prior to Admission medications   Medication Sig Start Date End Date Taking? Authorizing Provider  baclofen (LIORESAL) 20 MG tablet Take 1 tablet (20 mg total) by mouth 3 (three) times daily. 09/02/18   Angiulli, Lavon Paganini, PA-C  bisacodyl (DULCOLAX) 10 MG suppository Place 1 suppository (10 mg total) rectally daily. 09/02/18   Angiulli, Lavon Paganini, PA-C  cetirizine (ZYRTEC) 10 MG tablet Take 1 tablet (10 mg total) by mouth daily. 06/18/18   Bonnita Hollow, MD  diclofenac sodium (VOLTAREN) 1 % GEL Apply 2 g topically 3 (three) times daily. 09/02/18   Angiulli, Lavon Paganini, PA-C  ferrous sulfate 325 (65 FE) MG tablet Take 1 tablet (325 mg total) by mouth every other day. 08/17/18   [name], MD  fludrocortisone (FLORINEF) 0.1 MG tablet Take 1 tablet (0.1 mg total) by mouth daily. 09/03/18   Angiulli, Lavon Paganini, PA-C   levothyroxine (SYNTHROID) 150 MCG tablet Take 1 tablet (150 mcg total) by mouth daily before breakfast. 07/29/18 09/27/18  [name], MD  Multiple Vitamin (MULTIVITAMIN WITH MINERALS) TABS tablet Take 1 tablet by mouth daily. 02/24/18   Johnson, Clanford L, MD  oxyCODONE (ROXICODONE) 5 MG immediate release tablet Take 1 tablet (5 mg total) by mouth every 6 (six) hours as needed for severe pain. 09/02/18 09/02/19  Angiulli, Lavon Paganini, PA-C  pantoprazole (PROTONIX) 40 MG tablet Take 1 tablet (40 mg total) by mouth daily. Patient taking differently: Take 40 mg by mouth daily.  08/09/18   [name], MD  polyethylene glycol (MIRALAX / GLYCOLAX) 17 g packet Take 17 g by mouth daily. 09/03/18   Angiulli, Lavon Paganini, PA-C  rivaroxaban (XARELTO) 20 MG TABS tablet Take 1 tablet (20 mg total) by mouth daily with supper. 07/19/18   Diallo, Earna Coder, MD  sertraline (ZOLOFT) 50 MG tablet Take 50 mg by mouth daily.  12/24/17   [provider]  sulfamethoxazole-trimethoprim (BACTRIM DS) 800-160 MG tablet Take 1 tablet by mouth 2 (two) times daily for 7 days. 09/05/18 09/12/18  Axzel Rockhill, Jarrett Soho, PA-C    Family History Family History  Problem Relation Age of Onset  . Hypertension Mother   . Diabetes Mother   . Hypertension Father   . Stroke Father   . Diabetes Father   . Diabetes Sister   . Diabetes Sister     Social History Social History   Tobacco Use  . Smoking status: Never Smoker  . Smokeless tobacco: Never Used  Substance Use Topics  . Alcohol use: No  . Drug use: No     Allergies   Bee venom, Heparin, Keflex [cephalexin], Tramadol, Gabapentin, Hydrochlorothiazide, Hydrocodone, Oxycodone-acetaminophen, Sorbitol, and Tizanidine   Review of Systems Review of Systems  Constitutional: Negative for appetite change, diaphoresis, fatigue, fever and unexpected weight change.  HENT: Negative for mouth sores.   Eyes: Negative for visual disturbance.  Respiratory: Negative for  cough, chest tightness, shortness of breath and wheezing.   Cardiovascular: Negative for chest pain.  Gastrointestinal: Negative for abdominal pain, constipation, diarrhea, nausea and vomiting.  Endocrine: Negative for polydipsia, polyphagia and polyuria.  Genitourinary: Negative for dysuria, frequency, hematuria and urgency.  Musculoskeletal:  Negative for back pain and neck stiffness.  Skin: Positive for wound. Negative for rash.  Allergic/Immunologic: Negative for immunocompromised state.  Neurological: Negative for syncope, light-headedness and headaches.  Hematological: Does not bruise/bleed easily.  Psychiatric/Behavioral: Negative for sleep disturbance. The patient is not nervous/anxious.      Physical Exam Updated Vital Signs BP 102/61 (BP Location: Right Arm)   Pulse 70   Temp 98 F (36.7 C) (Oral)   Resp 16   Ht 5\' 11"  (1.803 m)   Wt 91.6 kg   LMP 10/22/2016 (LMP Unknown)   SpO2 100%   BMI 28.17 kg/m   Physical Exam Vitals signs and nursing note reviewed. Exam conducted with a chaperone present.  Constitutional:      General: She is not in acute distress.    Appearance: She is not diaphoretic.  HENT:     Head: Normocephalic.  Eyes:     General: No scleral icterus.    Conjunctiva/sclera: Conjunctivae normal.  Neck:     Musculoskeletal: Normal range of motion.  Cardiovascular:     Rate and Rhythm: Normal rate and regular rhythm.     Pulses: Normal pulses.          Radial pulses are 2+ on the right side and 2+ on the left side.  Pulmonary:     Effort: No tachypnea, accessory muscle usage, prolonged expiration, respiratory distress or retractions.     Breath sounds: No stridor.     Comments: Equal chest rise. No increased work of breathing. Abdominal:     General: There is no distension.     Palpations: Abdomen is soft.     Tenderness: There is no abdominal tenderness. There is no guarding or rebound.     Comments: Soft and nontender. No distension.  Bladder  is not palpable.  Genitourinary:    Exam position: Supine.     Labia:        Right: No rash, tenderness, lesion or injury.        Left: No rash, tenderness, lesion or injury.        Comments: 1 small area of stage 1 ulceration on the sacrum.  No signs of secondary infection. Musculoskeletal:     Comments: Moves all extremities equally and without difficulty.  Skin:    General: Skin is warm and dry.     Capillary Refill: Capillary refill takes less than 2 seconds.  Neurological:     Mental Status: She is alert.     GCS: GCS eye subscore is 4. GCS verbal subscore is 5. GCS motor subscore is 6.     Comments: Speech is clear and goal oriented.  Psychiatric:        Mood and Affect: Mood normal.      ED Treatments / Results  Labs (all labs ordered are listed, but only abnormal results are displayed) Labs Reviewed  URINALYSIS, ROUTINE W REFLEX MICROSCOPIC - Abnormal; Notable for the following components:      Result Value   Color, Urine AMBER (*)    APPearance TURBID (*)    Hgb urine dipstick SMALL (*)    Nitrite POSITIVE (*)    Leukocytes,Ua MODERATE (*)    WBC, UA >50 (*)    Bacteria, UA MANY (*)    Non Squamous Epithelial 0-5 (*)    All other components within normal limits  URINE CULTURE     Procedures Procedures (including critical care time)  Medications Ordered in ED Medications  sulfamethoxazole-trimethoprim (BACTRIM DS) 800-160  MG per tablet 1 tablet (has no administration in time range)     Initial Impression / Assessment and Plan / ED Course  I have reviewed the triage vital signs and the nursing notes.  Pertinent labs & imaging results that were available during my care of the patient were reviewed by me and considered in my medical decision making (see chart for details).         Pt presents for check of foley catheter.  No urinary symptoms, but catheter placed yesterday is not draining.  No urine in the bag or tube.  Abd soft and nontender.  No  clinical evidence of bladder distension. Foley was removed and reinserted with good visualization of the urethra.  Immediate urine return into the bag.  No drainage of urine from the large wounds on the buttock.  Wounds are healing and do not appear to be actively infected. Packing replaced with clean wet-to-dry packing.  Pt will be d/c back to facility with foley in place. They are to replace the wound vac tomorrow.   UA consistent with urinary tract infection.  She has nitrites, leukocytes, white blood cells and bacteria.  Will start on Bactrim as patient is allergic to Keflex.  No fever, chills, abdominal pain, tachycardia or hypotension.  Doubt pyelonephritis or sepsis at this time.  Patient will be discharged back to her nursing care facility.  Discussed reasons to return to the emergency department.  Patient states understanding and is in agreement with the plan.  Final Clinical Impressions(s) / ED Diagnoses   Final diagnoses:  Acute cystitis without hematuria  Foley catheter problem, initial encounter [facility])  Encounter for wound re-check    ED Discharge Orders         Ordered    sulfamethoxazole-trimethoprim (BACTRIM DS) 800-160 MG tablet  2 times daily     09/05/18 2004           Verline Kong, Gwenlyn Perking 09/05/18 2008    Virgel Manifold, MD 09/06/18 1615

## 2018-09-05 NOTE — Discharge Instructions (Addendum)
1. Medications: Bactrim, usual home medications 2. Treatment: rest, drink plenty of fluids, take medications as prescribed 3. Follow Up: Please followup with your primary doctor in 3 days for discussion of your diagnoses and further evaluation after today's visit; return to the ER for fevers, persistent vomiting, worsening abdominal pain or other concerning symptoms.

## 2018-09-06 DIAGNOSIS — Z960 Presence of urogenital implants: Secondary | ICD-10-CM | POA: Diagnosis not present

## 2018-09-06 DIAGNOSIS — L89159 Pressure ulcer of sacral region, unspecified stage: Secondary | ICD-10-CM | POA: Diagnosis not present

## 2018-09-06 DIAGNOSIS — N390 Urinary tract infection, site not specified: Secondary | ICD-10-CM | POA: Diagnosis not present

## 2018-09-06 DIAGNOSIS — L89319 Pressure ulcer of right buttock, unspecified stage: Secondary | ICD-10-CM | POA: Diagnosis not present

## 2018-09-07 LAB — URINE CULTURE

## 2018-09-08 DIAGNOSIS — I10 Essential (primary) hypertension: Secondary | ICD-10-CM | POA: Diagnosis not present

## 2018-09-08 DIAGNOSIS — Z7409 Other reduced mobility: Secondary | ICD-10-CM | POA: Diagnosis not present

## 2018-09-08 DIAGNOSIS — G8220 Paraplegia, unspecified: Secondary | ICD-10-CM | POA: Diagnosis not present

## 2018-09-08 DIAGNOSIS — D34 Benign neoplasm of thyroid gland: Secondary | ICD-10-CM | POA: Diagnosis not present

## 2018-09-08 DIAGNOSIS — L89214 Pressure ulcer of right hip, stage 4: Secondary | ICD-10-CM | POA: Diagnosis not present

## 2018-09-08 DIAGNOSIS — N184 Chronic kidney disease, stage 4 (severe): Secondary | ICD-10-CM | POA: Diagnosis not present

## 2018-09-08 DIAGNOSIS — D649 Anemia, unspecified: Secondary | ICD-10-CM | POA: Diagnosis not present

## 2018-09-08 DIAGNOSIS — M6281 Muscle weakness (generalized): Secondary | ICD-10-CM | POA: Diagnosis not present

## 2018-09-08 DIAGNOSIS — Z5181 Encounter for therapeutic drug level monitoring: Secondary | ICD-10-CM | POA: Diagnosis not present

## 2018-09-08 DIAGNOSIS — E039 Hypothyroidism, unspecified: Secondary | ICD-10-CM | POA: Diagnosis not present

## 2018-09-08 DIAGNOSIS — Z79899 Other long term (current) drug therapy: Secondary | ICD-10-CM | POA: Diagnosis not present

## 2018-09-09 DIAGNOSIS — G8220 Paraplegia, unspecified: Secondary | ICD-10-CM | POA: Diagnosis not present

## 2018-09-09 DIAGNOSIS — L89159 Pressure ulcer of sacral region, unspecified stage: Secondary | ICD-10-CM | POA: Diagnosis not present

## 2018-09-09 DIAGNOSIS — L89319 Pressure ulcer of right buttock, unspecified stage: Secondary | ICD-10-CM | POA: Diagnosis not present

## 2018-09-09 DIAGNOSIS — N390 Urinary tract infection, site not specified: Secondary | ICD-10-CM | POA: Diagnosis not present

## 2018-09-10 ENCOUNTER — Other Ambulatory Visit: Payer: Self-pay | Admitting: *Deleted

## 2018-09-10 NOTE — Patient Outreach (Signed)
[facility]) Care Management  09/10/2018  Sarah [name] 1965-06-07 [id number]   Telephone Screen  Referral Date:  09/10/2018 Referral Source:  [facility] High Risk List Reason for Referral:  Assess Needs Insurance:  [employer] Attempt:  Received referral for screening of patient.  Per chart review patient discharged from Inpatient Rehabilitation to [place].  RN Health Coach outreached to [facility] and spoke with Romona who confirms patient is current resident there.  Plan:  RN Health Coach will make patient inactive with [facility] due to patient not eligible for services while in [place].   [place] [phone] [email or web address] [email or web address]

## 2018-09-13 ENCOUNTER — Other Ambulatory Visit: Payer: Self-pay | Admitting: Radiation Therapy

## 2018-09-15 DIAGNOSIS — M6281 Muscle weakness (generalized): Secondary | ICD-10-CM | POA: Diagnosis not present

## 2018-09-15 DIAGNOSIS — Z7409 Other reduced mobility: Secondary | ICD-10-CM | POA: Diagnosis not present

## 2018-09-15 DIAGNOSIS — L89214 Pressure ulcer of right hip, stage 4: Secondary | ICD-10-CM | POA: Diagnosis not present

## 2018-09-16 ENCOUNTER — Ambulatory Visit (HOSPITAL_COMMUNITY): Payer: Medicare Other

## 2018-09-21 ENCOUNTER — Institutional Professional Consult (permissible substitution): Payer: Self-pay | Admitting: Urology

## 2018-09-22 ENCOUNTER — Ambulatory Visit (HOSPITAL_COMMUNITY): Payer: Medicare Other

## 2018-09-22 DIAGNOSIS — Z7409 Other reduced mobility: Secondary | ICD-10-CM | POA: Diagnosis not present

## 2018-09-22 DIAGNOSIS — L89214 Pressure ulcer of right hip, stage 4: Secondary | ICD-10-CM | POA: Diagnosis not present

## 2018-09-22 DIAGNOSIS — L89224 Pressure ulcer of left hip, stage 4: Secondary | ICD-10-CM | POA: Diagnosis not present

## 2018-09-22 DIAGNOSIS — L89610 Pressure ulcer of right heel, unstageable: Secondary | ICD-10-CM | POA: Diagnosis not present

## 2018-09-23 ENCOUNTER — Institutional Professional Consult (permissible substitution): Payer: Medicare Other | Admitting: Urology

## 2018-09-29 ENCOUNTER — Inpatient Hospital Stay: Payer: Medicare Other | Attending: Hematology

## 2018-09-29 ENCOUNTER — Encounter (HOSPITAL_COMMUNITY): Payer: Medicare Other

## 2018-09-29 DIAGNOSIS — Z7409 Other reduced mobility: Secondary | ICD-10-CM | POA: Diagnosis not present

## 2018-09-29 DIAGNOSIS — Z5181 Encounter for therapeutic drug level monitoring: Secondary | ICD-10-CM | POA: Diagnosis not present

## 2018-09-29 DIAGNOSIS — L89214 Pressure ulcer of right hip, stage 4: Secondary | ICD-10-CM | POA: Diagnosis not present

## 2018-09-29 DIAGNOSIS — L89224 Pressure ulcer of left hip, stage 4: Secondary | ICD-10-CM | POA: Diagnosis not present

## 2018-09-29 DIAGNOSIS — L89610 Pressure ulcer of right heel, unstageable: Secondary | ICD-10-CM | POA: Diagnosis not present

## 2018-10-04 DIAGNOSIS — C7931 Secondary malignant neoplasm of brain: Secondary | ICD-10-CM | POA: Diagnosis not present

## 2018-10-06 DIAGNOSIS — L89224 Pressure ulcer of left hip, stage 4: Secondary | ICD-10-CM | POA: Diagnosis not present

## 2018-10-06 DIAGNOSIS — Z7409 Other reduced mobility: Secondary | ICD-10-CM | POA: Diagnosis not present

## 2018-10-06 DIAGNOSIS — L89214 Pressure ulcer of right hip, stage 4: Secondary | ICD-10-CM | POA: Diagnosis not present

## 2018-10-06 DIAGNOSIS — L89610 Pressure ulcer of right heel, unstageable: Secondary | ICD-10-CM | POA: Diagnosis not present

## 2018-10-11 DIAGNOSIS — L089 Local infection of the skin and subcutaneous tissue, unspecified: Secondary | ICD-10-CM | POA: Diagnosis not present

## 2018-10-11 DIAGNOSIS — L89319 Pressure ulcer of right buttock, unspecified stage: Secondary | ICD-10-CM | POA: Diagnosis not present

## 2018-10-11 DIAGNOSIS — N390 Urinary tract infection, site not specified: Secondary | ICD-10-CM | POA: Diagnosis not present

## 2018-10-11 DIAGNOSIS — L89159 Pressure ulcer of sacral region, unspecified stage: Secondary | ICD-10-CM | POA: Diagnosis not present

## 2018-10-13 ENCOUNTER — Telehealth: Payer: Self-pay | Admitting: Hematology

## 2018-10-13 ENCOUNTER — Inpatient Hospital Stay: Payer: Medicare Other | Admitting: Hematology

## 2018-10-13 ENCOUNTER — Telehealth: Payer: Self-pay | Admitting: *Deleted

## 2018-10-13 DIAGNOSIS — L89610 Pressure ulcer of right heel, unstageable: Secondary | ICD-10-CM | POA: Diagnosis not present

## 2018-10-13 DIAGNOSIS — M6281 Muscle weakness (generalized): Secondary | ICD-10-CM | POA: Diagnosis not present

## 2018-10-13 DIAGNOSIS — Z7409 Other reduced mobility: Secondary | ICD-10-CM | POA: Diagnosis not present

## 2018-10-13 DIAGNOSIS — L89224 Pressure ulcer of left hip, stage 4: Secondary | ICD-10-CM | POA: Diagnosis not present

## 2018-10-13 DIAGNOSIS — L89214 Pressure ulcer of right hip, stage 4: Secondary | ICD-10-CM | POA: Diagnosis not present

## 2018-10-13 NOTE — Telephone Encounter (Signed)
Called pt per 7/22 sch message to reschedule missed appt in July - left message for patient to call back to reschedule

## 2018-10-13 NOTE — Telephone Encounter (Signed)
Contacted patient regarding missed appt today 7/22. Patient stated she was currently in a rehab facility and had forgotten about appt. She asked to reschedule - scheduling message sent. She verbalized understanding that scheduling will contact her.  She also stated she wants to schedule her PET ordered in  April by [name]. Gave her number for central scheduling.

## 2018-10-20 DIAGNOSIS — L89224 Pressure ulcer of left hip, stage 4: Secondary | ICD-10-CM | POA: Diagnosis not present

## 2018-10-20 DIAGNOSIS — Z7409 Other reduced mobility: Secondary | ICD-10-CM | POA: Diagnosis not present

## 2018-10-20 DIAGNOSIS — L89214 Pressure ulcer of right hip, stage 4: Secondary | ICD-10-CM | POA: Diagnosis not present

## 2018-10-20 DIAGNOSIS — L89610 Pressure ulcer of right heel, unstageable: Secondary | ICD-10-CM | POA: Diagnosis not present

## 2018-10-22 DIAGNOSIS — D649 Anemia, unspecified: Secondary | ICD-10-CM | POA: Diagnosis not present

## 2018-10-22 DIAGNOSIS — N184 Chronic kidney disease, stage 4 (severe): Secondary | ICD-10-CM | POA: Diagnosis not present

## 2018-10-22 DIAGNOSIS — R0989 Other specified symptoms and signs involving the circulatory and respiratory systems: Secondary | ICD-10-CM | POA: Diagnosis not present

## 2018-10-25 ENCOUNTER — Inpatient Hospital Stay (HOSPITAL_COMMUNITY)
Admission: EM | Admit: 2018-10-25 | Discharge: 2018-11-23 | DRG: 207 | Disposition: E | Payer: Medicare Other | Attending: Internal Medicine | Admitting: Internal Medicine

## 2018-10-25 ENCOUNTER — Emergency Department (HOSPITAL_COMMUNITY): Payer: Medicare Other

## 2018-10-25 DIAGNOSIS — Z9289 Personal history of other medical treatment: Secondary | ICD-10-CM

## 2018-10-25 DIAGNOSIS — Z7189 Other specified counseling: Secondary | ICD-10-CM | POA: Diagnosis not present

## 2018-10-25 DIAGNOSIS — C7949 Secondary malignant neoplasm of other parts of nervous system: Secondary | ICD-10-CM | POA: Diagnosis not present

## 2018-10-25 DIAGNOSIS — Y95 Nosocomial condition: Secondary | ICD-10-CM | POA: Diagnosis present

## 2018-10-25 DIAGNOSIS — Z01818 Encounter for other preprocedural examination: Secondary | ICD-10-CM

## 2018-10-25 DIAGNOSIS — Z885 Allergy status to narcotic agent status: Secondary | ICD-10-CM

## 2018-10-25 DIAGNOSIS — L89154 Pressure ulcer of sacral region, stage 4: Secondary | ICD-10-CM | POA: Diagnosis not present

## 2018-10-25 DIAGNOSIS — R740 Nonspecific elevation of levels of transaminase and lactic acid dehydrogenase [LDH]: Secondary | ICD-10-CM | POA: Diagnosis not present

## 2018-10-25 DIAGNOSIS — Z86711 Personal history of pulmonary embolism: Secondary | ICD-10-CM | POA: Diagnosis not present

## 2018-10-25 DIAGNOSIS — C7951 Secondary malignant neoplasm of bone: Secondary | ICD-10-CM | POA: Diagnosis not present

## 2018-10-25 DIAGNOSIS — Z7951 Long term (current) use of inhaled steroids: Secondary | ICD-10-CM

## 2018-10-25 DIAGNOSIS — C7931 Secondary malignant neoplasm of brain: Secondary | ICD-10-CM | POA: Diagnosis not present

## 2018-10-25 DIAGNOSIS — L089 Local infection of the skin and subcutaneous tissue, unspecified: Secondary | ICD-10-CM | POA: Diagnosis not present

## 2018-10-25 DIAGNOSIS — E161 Other hypoglycemia: Secondary | ICD-10-CM | POA: Diagnosis not present

## 2018-10-25 DIAGNOSIS — J189 Pneumonia, unspecified organism: Principal | ICD-10-CM | POA: Diagnosis present

## 2018-10-25 DIAGNOSIS — J9811 Atelectasis: Secondary | ICD-10-CM | POA: Diagnosis present

## 2018-10-25 DIAGNOSIS — A419 Sepsis, unspecified organism: Secondary | ICD-10-CM | POA: Diagnosis not present

## 2018-10-25 DIAGNOSIS — G8220 Paraplegia, unspecified: Secondary | ICD-10-CM | POA: Diagnosis present

## 2018-10-25 DIAGNOSIS — J181 Lobar pneumonia, unspecified organism: Secondary | ICD-10-CM

## 2018-10-25 DIAGNOSIS — T83518A Infection and inflammatory reaction due to other urinary catheter, initial encounter: Secondary | ICD-10-CM | POA: Diagnosis present

## 2018-10-25 DIAGNOSIS — N390 Urinary tract infection, site not specified: Secondary | ICD-10-CM | POA: Diagnosis present

## 2018-10-25 DIAGNOSIS — Z515 Encounter for palliative care: Secondary | ICD-10-CM

## 2018-10-25 DIAGNOSIS — D631 Anemia in chronic kidney disease: Secondary | ICD-10-CM | POA: Diagnosis present

## 2018-10-25 DIAGNOSIS — N183 Chronic kidney disease, stage 3 (moderate): Secondary | ICD-10-CM | POA: Diagnosis not present

## 2018-10-25 DIAGNOSIS — Z978 Presence of other specified devices: Secondary | ICD-10-CM

## 2018-10-25 DIAGNOSIS — R6521 Severe sepsis with septic shock: Secondary | ICD-10-CM | POA: Diagnosis not present

## 2018-10-25 DIAGNOSIS — G8929 Other chronic pain: Secondary | ICD-10-CM | POA: Diagnosis present

## 2018-10-25 DIAGNOSIS — Z20828 Contact with and (suspected) exposure to other viral communicable diseases: Secondary | ICD-10-CM | POA: Diagnosis present

## 2018-10-25 DIAGNOSIS — C73 Malignant neoplasm of thyroid gland: Secondary | ICD-10-CM | POA: Diagnosis present

## 2018-10-25 DIAGNOSIS — K760 Fatty (change of) liver, not elsewhere classified: Secondary | ICD-10-CM | POA: Diagnosis not present

## 2018-10-25 DIAGNOSIS — G901 Familial dysautonomia [Riley-Day]: Secondary | ICD-10-CM | POA: Diagnosis present

## 2018-10-25 DIAGNOSIS — Z86718 Personal history of other venous thrombosis and embolism: Secondary | ICD-10-CM | POA: Diagnosis not present

## 2018-10-25 DIAGNOSIS — Z781 Physical restraint status: Secondary | ICD-10-CM

## 2018-10-25 DIAGNOSIS — Z4682 Encounter for fitting and adjustment of non-vascular catheter: Secondary | ICD-10-CM | POA: Diagnosis not present

## 2018-10-25 DIAGNOSIS — A4151 Sepsis due to Escherichia coli [E. coli]: Secondary | ICD-10-CM | POA: Diagnosis not present

## 2018-10-25 DIAGNOSIS — J690 Pneumonitis due to inhalation of food and vomit: Secondary | ICD-10-CM | POA: Diagnosis present

## 2018-10-25 DIAGNOSIS — F329 Major depressive disorder, single episode, unspecified: Secondary | ICD-10-CM | POA: Diagnosis present

## 2018-10-25 DIAGNOSIS — R0602 Shortness of breath: Secondary | ICD-10-CM | POA: Diagnosis not present

## 2018-10-25 DIAGNOSIS — G9529 Other cord compression: Secondary | ICD-10-CM | POA: Diagnosis not present

## 2018-10-25 DIAGNOSIS — J9621 Acute and chronic respiratory failure with hypoxia: Secondary | ICD-10-CM | POA: Diagnosis not present

## 2018-10-25 DIAGNOSIS — B3781 Candidal esophagitis: Secondary | ICD-10-CM | POA: Diagnosis present

## 2018-10-25 DIAGNOSIS — I9589 Other hypotension: Secondary | ICD-10-CM | POA: Diagnosis present

## 2018-10-25 DIAGNOSIS — L8990 Pressure ulcer of unspecified site, unspecified stage: Secondary | ICD-10-CM | POA: Insufficient documentation

## 2018-10-25 DIAGNOSIS — Y846 Urinary catheterization as the cause of abnormal reaction of the patient, or of later complication, without mention of misadventure at the time of the procedure: Secondary | ICD-10-CM | POA: Diagnosis present

## 2018-10-25 DIAGNOSIS — E890 Postprocedural hypothyroidism: Secondary | ICD-10-CM | POA: Diagnosis present

## 2018-10-25 DIAGNOSIS — R9431 Abnormal electrocardiogram [ECG] [EKG]: Secondary | ICD-10-CM | POA: Diagnosis not present

## 2018-10-25 DIAGNOSIS — J9622 Acute and chronic respiratory failure with hypercapnia: Secondary | ICD-10-CM | POA: Diagnosis not present

## 2018-10-25 DIAGNOSIS — J9620 Acute and chronic respiratory failure, unspecified whether with hypoxia or hypercapnia: Secondary | ICD-10-CM | POA: Diagnosis present

## 2018-10-25 DIAGNOSIS — J9690 Respiratory failure, unspecified, unspecified whether with hypoxia or hypercapnia: Secondary | ICD-10-CM | POA: Diagnosis not present

## 2018-10-25 DIAGNOSIS — E874 Mixed disorder of acid-base balance: Secondary | ICD-10-CM | POA: Diagnosis not present

## 2018-10-25 DIAGNOSIS — J984 Other disorders of lung: Secondary | ICD-10-CM | POA: Diagnosis not present

## 2018-10-25 DIAGNOSIS — L89304 Pressure ulcer of unspecified buttock, stage 4: Secondary | ICD-10-CM | POA: Diagnosis not present

## 2018-10-25 DIAGNOSIS — K761 Chronic passive congestion of liver: Secondary | ICD-10-CM | POA: Diagnosis present

## 2018-10-25 DIAGNOSIS — D696 Thrombocytopenia, unspecified: Secondary | ICD-10-CM | POA: Diagnosis present

## 2018-10-25 DIAGNOSIS — J9601 Acute respiratory failure with hypoxia: Secondary | ICD-10-CM

## 2018-10-25 DIAGNOSIS — T17908A Unspecified foreign body in respiratory tract, part unspecified causing other injury, initial encounter: Secondary | ICD-10-CM

## 2018-10-25 DIAGNOSIS — R739 Hyperglycemia, unspecified: Secondary | ICD-10-CM | POA: Diagnosis not present

## 2018-10-25 DIAGNOSIS — G992 Myelopathy in diseases classified elsewhere: Secondary | ICD-10-CM | POA: Diagnosis not present

## 2018-10-25 DIAGNOSIS — I361 Nonrheumatic tricuspid (valve) insufficiency: Secondary | ICD-10-CM | POA: Diagnosis not present

## 2018-10-25 DIAGNOSIS — K219 Gastro-esophageal reflux disease without esophagitis: Secondary | ICD-10-CM | POA: Diagnosis present

## 2018-10-25 DIAGNOSIS — E162 Hypoglycemia, unspecified: Secondary | ICD-10-CM | POA: Diagnosis not present

## 2018-10-25 DIAGNOSIS — F419 Anxiety disorder, unspecified: Secondary | ICD-10-CM | POA: Diagnosis present

## 2018-10-25 DIAGNOSIS — Z91030 Bee allergy status: Secondary | ICD-10-CM

## 2018-10-25 DIAGNOSIS — R069 Unspecified abnormalities of breathing: Secondary | ICD-10-CM | POA: Diagnosis not present

## 2018-10-25 DIAGNOSIS — I129 Hypertensive chronic kidney disease with stage 1 through stage 4 chronic kidney disease, or unspecified chronic kidney disease: Secondary | ICD-10-CM | POA: Diagnosis not present

## 2018-10-25 DIAGNOSIS — Z888 Allergy status to other drugs, medicaments and biological substances status: Secondary | ICD-10-CM

## 2018-10-25 DIAGNOSIS — J90 Pleural effusion, not elsewhere classified: Secondary | ICD-10-CM | POA: Diagnosis not present

## 2018-10-25 DIAGNOSIS — K592 Neurogenic bowel, not elsewhere classified: Secondary | ICD-10-CM | POA: Diagnosis present

## 2018-10-25 DIAGNOSIS — E876 Hypokalemia: Secondary | ICD-10-CM | POA: Diagnosis not present

## 2018-10-25 DIAGNOSIS — Z7901 Long term (current) use of anticoagulants: Secondary | ICD-10-CM

## 2018-10-25 DIAGNOSIS — N319 Neuromuscular dysfunction of bladder, unspecified: Secondary | ICD-10-CM | POA: Diagnosis present

## 2018-10-25 DIAGNOSIS — R7401 Elevation of levels of liver transaminase levels: Secondary | ICD-10-CM | POA: Diagnosis present

## 2018-10-25 DIAGNOSIS — Z4659 Encounter for fitting and adjustment of other gastrointestinal appliance and device: Secondary | ICD-10-CM

## 2018-10-25 DIAGNOSIS — M549 Dorsalgia, unspecified: Secondary | ICD-10-CM | POA: Diagnosis present

## 2018-10-25 DIAGNOSIS — Z8249 Family history of ischemic heart disease and other diseases of the circulatory system: Secondary | ICD-10-CM

## 2018-10-25 DIAGNOSIS — R040 Epistaxis: Secondary | ICD-10-CM | POA: Diagnosis not present

## 2018-10-25 DIAGNOSIS — Z881 Allergy status to other antibiotic agents status: Secondary | ICD-10-CM

## 2018-10-25 DIAGNOSIS — G959 Disease of spinal cord, unspecified: Secondary | ICD-10-CM | POA: Diagnosis present

## 2018-10-25 DIAGNOSIS — G9340 Encephalopathy, unspecified: Secondary | ICD-10-CM | POA: Diagnosis not present

## 2018-10-25 DIAGNOSIS — J219 Acute bronchiolitis, unspecified: Secondary | ICD-10-CM | POA: Diagnosis not present

## 2018-10-25 DIAGNOSIS — Z79899 Other long term (current) drug therapy: Secondary | ICD-10-CM

## 2018-10-25 DIAGNOSIS — Z79890 Hormone replacement therapy (postmenopausal): Secondary | ICD-10-CM

## 2018-10-25 DIAGNOSIS — J188 Other pneumonia, unspecified organism: Secondary | ICD-10-CM | POA: Diagnosis not present

## 2018-10-25 DIAGNOSIS — R609 Edema, unspecified: Secondary | ICD-10-CM | POA: Diagnosis not present

## 2018-10-25 DIAGNOSIS — R918 Other nonspecific abnormal finding of lung field: Secondary | ICD-10-CM | POA: Diagnosis not present

## 2018-10-25 LAB — COMPREHENSIVE METABOLIC PANEL
ALT: 31 U/L (ref 0–44)
AST: 130 U/L — ABNORMAL HIGH (ref 15–41)
Albumin: 2.3 g/dL — ABNORMAL LOW (ref 3.5–5.0)
Alkaline Phosphatase: 178 U/L — ABNORMAL HIGH (ref 38–126)
Anion gap: 12 (ref 5–15)
BUN: 18 mg/dL (ref 6–20)
CO2: 23 mmol/L (ref 22–32)
Calcium: 7.6 mg/dL — ABNORMAL LOW (ref 8.9–10.3)
Chloride: 106 mmol/L (ref 98–111)
Creatinine, Ser: 0.86 mg/dL (ref 0.44–1.00)
GFR calc Af Amer: >60 mL/min (ref >60)
GFR calc non Af Amer: >60 mL/min (ref >60)
Glucose, Bld: 73 mg/dL (ref 70–99)
Potassium: 3.7 mmol/L (ref 3.5–5.1)
Sodium: 141 mmol/L (ref 135–145)
Total Bilirubin: 1 mg/dL (ref 0.3–1.2)
Total Protein: 6 g/dL — ABNORMAL LOW (ref 6.5–8.1)

## 2018-10-25 LAB — CBC WITH DIFFERENTIAL/PLATELET
Abs Immature Granulocytes: 0.16 10*3/uL — ABNORMAL HIGH (ref 0.00–0.07)
Basophils Absolute: 0 10*3/uL (ref 0.0–0.1)
Basophils Relative: 0 %
Eosinophils Absolute: 0 10*3/uL (ref 0.0–0.5)
Eosinophils Relative: 0 %
HCT: 28.5 % — ABNORMAL LOW (ref 36.0–46.0)
Hemoglobin: 8.3 g/dL — ABNORMAL LOW (ref 12.0–15.0)
Immature Granulocytes: 2 %
Lymphocytes Relative: 7 %
Lymphs Abs: 0.5 10*3/uL — ABNORMAL LOW (ref 0.7–4.0)
MCH: 29.1 pg (ref 26.0–34.0)
MCHC: 29.1 g/dL — ABNORMAL LOW (ref 30.0–36.0)
MCV: 100 fL (ref 80.0–100.0)
Monocytes Absolute: 0.2 10*3/uL (ref 0.1–1.0)
Monocytes Relative: 3 %
Neutro Abs: 6 10*3/uL (ref 1.7–7.7)
Neutrophils Relative %: 88 %
Platelets: 199 10*3/uL (ref 150–400)
RBC: 2.85 MIL/uL — ABNORMAL LOW (ref 3.87–5.11)
RDW: 18.6 % — ABNORMAL HIGH (ref 11.5–15.5)
WBC: 6.9 10*3/uL (ref 4.0–10.5)
nRBC: 0 % (ref 0.0–0.2)

## 2018-10-25 LAB — URINALYSIS, ROUTINE W REFLEX MICROSCOPIC
Bilirubin Urine: NEGATIVE
Glucose, UA: NEGATIVE mg/dL
Hgb urine dipstick: NEGATIVE
Ketones, ur: 20 mg/dL — AB
Nitrite: POSITIVE — AB
Protein, ur: NEGATIVE mg/dL
Specific Gravity, Urine: 1.013 (ref 1.005–1.030)
pH: 6 (ref 5.0–8.0)

## 2018-10-25 LAB — SARS CORONAVIRUS 2 BY RT PCR (HOSPITAL ORDER, PERFORMED IN ~~LOC~~ HOSPITAL LAB): SARS Coronavirus 2: NEGATIVE

## 2018-10-25 LAB — PROTIME-INR
INR: 2.5 — ABNORMAL HIGH (ref 0.8–1.2)
Prothrombin Time: 26.9 seconds — ABNORMAL HIGH (ref 11.4–15.2)

## 2018-10-25 LAB — APTT: aPTT: 46 seconds — ABNORMAL HIGH (ref 24–36)

## 2018-10-25 LAB — LACTIC ACID, PLASMA: Lactic Acid, Venous: 0.6 mmol/L (ref 0.5–1.9)

## 2018-10-25 MED ORDER — ACETAMINOPHEN 650 MG RE SUPP: 650.0000 mg | Freq: Four times a day (QID) | RECTAL | Status: DC | PRN

## 2018-10-25 MED ORDER — ADULT MULTIVITAMIN W/MINERALS CH
1.0000 | ORAL_TABLET | Freq: Every day | ORAL | Status: DC
  Administered 2018-10-26 – 2018-10-27 (×2): 1 via ORAL
  Filled 2018-10-25 (×4): qty 1

## 2018-10-25 MED ORDER — PIPERACILLIN-TAZOBACTAM 3.375 G IVPB 30 MIN
3.3750 g | Freq: Once | INTRAVENOUS | Status: AC
  Administered 2018-10-25: 3.375 g via INTRAVENOUS
  Filled 2018-10-25: qty 50

## 2018-10-25 MED ORDER — RIVAROXABAN 20 MG PO TABS
20.0000 mg | ORAL_TABLET | Freq: Every day | ORAL | Status: DC
  Administered 2018-10-26 – 2018-10-27 (×2): 20 mg via ORAL
  Filled 2018-10-25 (×3): qty 1

## 2018-10-25 MED ORDER — SODIUM CHLORIDE 0.9 % IV SOLN
INTRAVENOUS | Status: DC
  Administered 2018-10-25 – 2018-10-27 (×3): via INTRAVENOUS

## 2018-10-25 MED ORDER — GUAIFENESIN ER 600 MG PO TB12
600.0000 mg | ORAL_TABLET | Freq: Two times a day (BID) | ORAL | Status: DC
  Administered 2018-10-25 – 2018-10-27 (×5): 600 mg via ORAL
  Filled 2018-10-25 (×8): qty 1

## 2018-10-25 MED ORDER — VANCOMYCIN HCL IN DEXTROSE 1-5 GM/200ML-% IV SOLN
1000.0000 mg | INTRAVENOUS | Status: DC
  Administered 2018-10-26: 1000 mg via INTRAVENOUS
  Filled 2018-10-25: qty 200

## 2018-10-25 MED ORDER — TRAZODONE HCL 50 MG PO TABS: 50.0000 mg | ORAL_TABLET | Freq: Every evening | ORAL | Status: DC | PRN

## 2018-10-25 MED ORDER — PANTOPRAZOLE SODIUM 40 MG PO TBEC
40.0000 mg | DELAYED_RELEASE_TABLET | Freq: Every day | ORAL | Status: DC
  Administered 2018-10-26 – 2018-10-27 (×2): 40 mg via ORAL
  Filled 2018-10-25 (×4): qty 1

## 2018-10-25 MED ORDER — FLUDROCORTISONE ACETATE 0.1 MG PO TABS
0.1000 mg | ORAL_TABLET | Freq: Every day | ORAL | Status: DC
  Administered 2018-10-26 – 2018-10-27 (×2): 0.1 mg via ORAL
  Filled 2018-10-25 (×4): qty 1

## 2018-10-25 MED ORDER — ENSURE ENLIVE PO LIQD
237.0000 mL | Freq: Two times a day (BID) | ORAL | Status: DC
  Administered 2018-10-26: 237 mL via ORAL

## 2018-10-25 MED ORDER — SODIUM CHLORIDE 0.9 % IV SOLN
250.0000 mL | INTRAVENOUS | Status: DC | PRN
  Administered 2018-10-30: 18:00:00 via INTRAVENOUS

## 2018-10-25 MED ORDER — VANCOMYCIN HCL IN DEXTROSE 1-5 GM/200ML-% IV SOLN
1000.0000 mg | Freq: Once | INTRAVENOUS | Status: AC
  Administered 2018-10-25: 1000 mg via INTRAVENOUS
  Filled 2018-10-25: qty 200

## 2018-10-25 MED ORDER — ONDANSETRON HCL 4 MG PO TABS: 4.0000 mg | ORAL_TABLET | Freq: Four times a day (QID) | ORAL | Status: DC | PRN

## 2018-10-25 MED ORDER — LEVOTHYROXINE SODIUM 50 MCG PO TABS
150.0000 ug | ORAL_TABLET | Freq: Every day | ORAL | Status: DC
  Administered 2018-10-27: 150 ug via ORAL
  Filled 2018-10-25 (×2): qty 3

## 2018-10-25 MED ORDER — BACLOFEN 10 MG PO TABS
20.0000 mg | ORAL_TABLET | Freq: Three times a day (TID) | ORAL | Status: DC
  Administered 2018-10-25 – 2018-10-27 (×7): 20 mg via ORAL
  Filled 2018-10-25 (×11): qty 2

## 2018-10-25 MED ORDER — OXYCODONE HCL 5 MG PO TABS
5.0000 mg | ORAL_TABLET | Freq: Four times a day (QID) | ORAL | Status: DC | PRN
  Administered 2018-10-26 – 2018-10-27 (×2): 5 mg via ORAL
  Filled 2018-10-25 (×2): qty 1

## 2018-10-25 MED ORDER — VANCOMYCIN HCL 10 G IV SOLR: 1250.0000 mg | Freq: Two times a day (BID) | INTRAVENOUS | Status: DC

## 2018-10-25 MED ORDER — SODIUM CHLORIDE 0.9% FLUSH
3.0000 mL | Freq: Two times a day (BID) | INTRAVENOUS | Status: DC
  Administered 2018-10-25 – 2018-11-01 (×9): 3 mL via INTRAVENOUS
  Administered 2018-11-01 – 2018-11-02 (×2): 10 mL via INTRAVENOUS
  Administered 2018-11-03 (×2): 3 mL via INTRAVENOUS

## 2018-10-25 MED ORDER — LORATADINE 10 MG PO TABS
10.0000 mg | ORAL_TABLET | Freq: Every day | ORAL | Status: DC
  Administered 2018-10-26 – 2018-10-27 (×2): 10 mg via ORAL
  Filled 2018-10-25 (×4): qty 1

## 2018-10-25 MED ORDER — METHYLPREDNISOLONE SODIUM SUCC 125 MG IJ SOLR
125.0000 mg | Freq: Once | INTRAMUSCULAR | Status: AC
  Administered 2018-10-25: 18:00:00 125 mg via INTRAVENOUS
  Filled 2018-10-25: qty 2

## 2018-10-25 MED ORDER — ALBUTEROL SULFATE HFA 108 (90 BASE) MCG/ACT IN AERS
4.0000 | INHALATION_SPRAY | RESPIRATORY_TRACT | Status: DC | PRN
  Administered 2018-10-25: 4 via RESPIRATORY_TRACT
  Filled 2018-10-25: qty 6.7

## 2018-10-25 MED ORDER — FLUTICASONE PROPIONATE 50 MCG/ACT NA SUSP
1.0000 | Freq: Every day | NASAL | Status: DC
  Administered 2018-10-26 – 2018-11-03 (×4): 1 via NASAL
  Filled 2018-10-25 (×2): qty 16

## 2018-10-25 MED ORDER — IPRATROPIUM-ALBUTEROL 0.5-2.5 (3) MG/3ML IN SOLN
3.0000 mL | Freq: Four times a day (QID) | RESPIRATORY_TRACT | Status: DC
  Administered 2018-10-25 – 2018-10-26 (×5): 3 mL via RESPIRATORY_TRACT
  Filled 2018-10-25 (×4): qty 3

## 2018-10-25 MED ORDER — VANCOMYCIN HCL IN DEXTROSE 1-5 GM/200ML-% IV SOLN
1000.0000 mg | Freq: Once | INTRAVENOUS | Status: AC
  Administered 2018-10-25: 23:00:00 1000 mg via INTRAVENOUS
  Filled 2018-10-25: qty 200

## 2018-10-25 MED ORDER — SERTRALINE HCL 50 MG PO TABS
50.0000 mg | ORAL_TABLET | Freq: Every day | ORAL | Status: DC
  Administered 2018-10-26 – 2018-10-27 (×2): 50 mg via ORAL
  Filled 2018-10-25 (×4): qty 1

## 2018-10-25 MED ORDER — POLYETHYLENE GLYCOL 3350 17 G PO PACK
17.0000 g | PACK | Freq: Every day | ORAL | Status: DC
  Administered 2018-10-26 – 2018-10-27 (×2): 17 g via ORAL
  Filled 2018-10-25 (×4): qty 1

## 2018-10-25 MED ORDER — POLYETHYLENE GLYCOL 3350 17 G PO PACK: 17.0000 g | PACK | Freq: Every day | ORAL | Status: DC | PRN

## 2018-10-25 MED ORDER — FERROUS SULFATE 325 (65 FE) MG PO TABS
325.0000 mg | ORAL_TABLET | ORAL | Status: DC
  Administered 2018-10-26: 09:00:00 325 mg via ORAL
  Filled 2018-10-25 (×8): qty 1

## 2018-10-25 MED ORDER — SODIUM CHLORIDE 0.9% FLUSH: 3.0000 mL | INTRAVENOUS | Status: DC | PRN

## 2018-10-25 MED ORDER — ONDANSETRON HCL 4 MG/2ML IJ SOLN
4.0000 mg | Freq: Four times a day (QID) | INTRAMUSCULAR | Status: DC | PRN
  Administered 2018-10-26 – 2018-11-01 (×6): 4 mg via INTRAVENOUS
  Filled 2018-10-25 (×7): qty 2

## 2018-10-25 MED ORDER — ACETAMINOPHEN 325 MG PO TABS
650.0000 mg | ORAL_TABLET | Freq: Four times a day (QID) | ORAL | Status: DC | PRN
  Administered 2018-11-02 – 2018-11-03 (×2): 650 mg via ORAL
  Filled 2018-10-25 (×2): qty 2

## 2018-10-25 MED ORDER — PIPERACILLIN-TAZOBACTAM 3.375 G IVPB
3.3750 g | Freq: Three times a day (TID) | INTRAVENOUS | Status: DC
  Administered 2018-10-26 – 2018-10-28 (×7): 3.375 g via INTRAVENOUS
  Filled 2018-10-25 (×7): qty 50

## 2018-10-25 MED ORDER — SODIUM CHLORIDE 0.9 % IV BOLUS
250.0000 mL | Freq: Once | INTRAVENOUS | Status: AC
  Administered 2018-10-25: 250 mL via INTRAVENOUS

## 2018-10-25 MED ORDER — BISACODYL 10 MG RE SUPP
10.0000 mg | Freq: Every day | RECTAL | Status: DC
  Administered 2018-10-25 – 2018-11-03 (×4): 10 mg via RECTAL
  Filled 2018-10-25 (×8): qty 1

## 2018-10-25 MED ORDER — ALBUTEROL SULFATE (2.5 MG/3ML) 0.083% IN NEBU: 2.5000 mg | INHALATION_SOLUTION | RESPIRATORY_TRACT | Status: DC | PRN

## 2018-10-25 NOTE — ED Provider Notes (Signed)
[facility] EMERGENCY DEPARTMENT Provider Note   CSN: [id number] Arrival date & time: 11/10/2018  1624     History   Chief Complaint No chief complaint on file.   HPI Sarah [name] is a 53 y.o. female.     Patient complains of shortness of breath.  Patient was diagnosed with pneumonia yesterday but has not started antibiotics yet.  She has become more short of breath  The history is provided by the patient. No language interpreter was used.  Shortness of Breath Severity:  Moderate Onset quality:  Sudden Timing:  Constant Progression:  Worsening Chronicity:  New Context: not activity   Relieved by:  Nothing Worsened by:  Nothing Ineffective treatments:  None tried Associated symptoms: no abdominal pain, no chest pain, no cough, no headaches and no rash     Past Medical History:  Diagnosis Date  . Anxiety   . 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 Chronic back pain   . Complication of anesthesia    Nausea and vomiting  . DDD (degenerative disc disease), cervical   . Depression   . DJD (degenerative joint disease)   . Family history of adverse reaction to anesthesia    MOTHER HAS NAUSEA   . GERD (gastroesophageal reflux disease)   . History of blood transfusion    08/2017  . History of rectal fissure   . HIT (heparin-induced thrombocytopenia) ([place])   . Hypertension    05/27/2018- not current- runs low  . Hypothyroidism   . Obesity   . Paraplegia at T4 level ([place])   . Pneumonia 03/2018  . Pulmonary embolus, right ([place]) 2015  . Rotator cuff tendonitis right    Patient Active Problem List   Diagnosis Date Noted  . PNA (pneumonia) 10/23/2018  . CKD (chronic kidney disease), stage III ([place])   . Hypoalbuminemia due to protein-calorie malnutrition ([place])   . Hyperglycemia   . Nasal congestion   . Hypotension   . Acute on chronic anemia   . Febrile illness   . Bacterial skin infection 08/10/2018  . Eustachian tube  disorder, left 06/30/2018  . Urinary tract infection 06/30/2018  . Malignant neoplasm ([place]) 06/01/2018  . Metastasis to brain ([place]) 06/01/2018  . Transaminitis 04/09/2018  . Hypoalbuminemia 04/09/2018  . Dyspnea   . Multifocal pneumonia   . Palliative care by specialist   . Goals of care, counseling/discussion   . DNR (do not resuscitate) discussion   . Fecal impaction ([place]) 02/06/2018  . HCAP (healthcare-associated pneumonia) 02/06/2018  . Anemia   . Chronic anticoagulation   . Sacral ulcer ([place]) 02/04/2018  . Fracture   . Acute lower UTI   . Myelopathy ([place]) 09/21/2017  . Muscle spasms of both lower extremities   . Closed fracture of shaft of left femur ([place])   . Acute blood loss anemia   . Hypothyroidism   . Nausea & vomiting 09/15/2017  . Closed fracture of left distal femur ([place]) 09/11/2017  . Paraplegia ([place]) 09/08/2017  . Obesity (BMI 30-39.9) 09/03/2017  . Status post surgery 09/03/2017  . Calculus of gallbladder without cholecystitis without obstruction   . Biliary dyskinesia   . Intractable nausea and vomiting 08/09/2017  . Thrombocytopenia ([place]) 08/09/2017  . Elevated bilirubin 08/09/2017  . AKI (acute kidney injury) ([place]) 08/09/2017  . Chest pain 01/26/2017  . Upper respiratory infection, viral 01/26/2017  . Counseling regarding advanced care planning and goals of care 01/02/2017  .  Bilateral rotator cuff syndrome 02/12/2016  . E. coli UTI 08/01/2015  . Constipation due to neurogenic bowel 08/01/2015  . 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 Spastic paraparesis   . Neurogenic bladder   . History of pulmonary embolism   . Post-operative pain   . Metastatic cancer ([place])   . Steroid-induced hyperglycemia   . Depression   . Obstipation   . Thoracic spine tumor 07/06/2015  . 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 Spinal cord compression due to malignant neoplasm metastatic to spine ([place])   . Postoperative hypothyroidism   . 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 Spasticity 09/19/2014  . Hypertension 05/01/2014  . Ingrown right big toenail 05/01/2014  . GERD (gastroesophageal reflux disease) 02/14/2014  . Dysphagia, pharyngoesophageal phase 02/06/2014  . Type 2 diabetes mellitus without complication ([place]) 29/93/7169  . Constipation   . Neurogenic bowel   . Paraplegia at T4 level ([place]) 01/20/2014  . 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 Rotator cuff tendonitis   . 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 Leg weakness, bilateral 12/13/2013    Past Surgical History:  Procedure Laterality Date  . APPLICATION OF CRANIAL NAVIGATION N/A 06/01/2018   Procedure: APPLICATION OF CRANIAL NAVIGATION;  Surgeon: Erline Levine, MD;  Location: [place];  Service: Neurosurgery;  Laterality: N/A;  . CHOLECYSTECTOMY N/A 01/25/2018   Procedure: LAPAROSCOPIC CHOLECYSTECTOMY;  Surgeon: Virl Cagey, MD;  Location: [facility];  Service: General;  Laterality: N/A;  . CRANIOTOMY N/A 06/01/2018   Procedure: Craniectomy for skull metastasis with Brainlab;  Surgeon: Erline Levine, MD;  Location: [place];  Service: Neurosurgery;  Laterality: N/A;  . FEMUR IM NAIL Left 09/11/2017   Procedure: INTRAMEDULLARY (IM) RETROGRADE FEMORAL NAILING;  Surgeon: Altamese Mount Cobb, MD;  Location: [place];  Service: Orthopedics;  Laterality: Left;  . LAMINECTOMY N/A 12/14/2013   Procedure: THORACIC LAMINECTOMY FOR TUMOR THORACIC THREE;  Surgeon: Ashok Pall, MD;  Location: [place] [facility];  Service: Neurosurgery;  Laterality: N/A;  THORACIC LAMINECTOMY FOR TUMOR THORACIC THREE  . LAMINECTOMY N/A 07/05/2015   Procedure: THORACIC LAMINECTOMY FOR TUMOR;  Surgeon: Ashok Pall, MD;  Location: [place] [facility];  Service: Neurosurgery;  Laterality: N/A;  . LAMINECTOMY N/A 09/03/2017   Procedure: POSTERIOR SPINAL TUMOR RESECTION THORACIC THREE;  Surgeon: Ashok Pall, MD;  Location: [facility];   Service: Neurosurgery;  Laterality: N/A;  POSTERIOR SPINAL TUMOR RESECTION THORACIC THREE  . POSTERIOR LUMBAR FUSION 4 LEVEL N/A 12/30/2013   Procedure: Thoracic one-Thoracic five posterior thoracic fusion with pedicle screws;  Surgeon: Ashok Pall, MD;  Location: [place] [facility];  Service: Neurosurgery;  Laterality: N/A;  Thoracic one-Thoracic five posterior thoracic fusion with pedicle screws  . THYROIDECTOMY N/A 01/09/2014   Procedure: TOTAL THYROIDECTOMY;  Surgeon: Izora Gala, MD;  Location: [place];  Service: ENT;  Laterality: N/A;  . TONSILLECTOMY       OB History    Gravida      Para      Term      Preterm      AB      Living  0     SAB      TAB      Ectopic      Multiple      Live Births               Home Medications    Prior to  Admission medications   Medication Sig Start Date End Date Taking? Authorizing Provider  acetaminophen (TYLENOL) 650 MG CR tablet Take 1,300 mg by mouth every 6 (six) hours as needed for pain.   Yes [provider]  Amino Acids-Protein Hydrolys (PRO-STAT PO) Take 30 mLs by mouth 2 (two) times daily.   Yes [provider]  baclofen (LIORESAL) 20 MG tablet Take 1 tablet (20 mg total) by mouth 3 (three) times daily. 09/02/18  Yes Angiulli, Lavon Paganini, PA-C  bisacodyl (DULCOLAX) 10 MG suppository Place 1 suppository (10 mg total) rectally daily. Patient taking differently: Place 10 mg rectally daily as needed for mild constipation or moderate constipation.  09/02/18  Yes Angiulli, Lavon Paganini, PA-C  diclofenac sodium (VOLTAREN) 1 % GEL Apply 2 g topically 3 (three) times daily. Patient taking differently: Apply 2 g topically 2 (two) times daily. Left shoulder 09/02/18  Yes Angiulli, Lavon Paganini, PA-C  ferrous sulfate 325 (65 FE) MG tablet Take 1 tablet (325 mg total) by mouth every other day. 08/17/18  Yes [name], MD  fludrocortisone (FLORINEF) 0.1 MG tablet Take 1 tablet (0.1 mg total) by mouth daily. 09/03/18  Yes Angiulli,  Lavon Paganini, PA-C  fluticasone (FLONASE) 50 MCG/ACT nasal spray Place 1 spray into both nostrils daily.   Yes [provider]  guaifenesin (HUMIBID E) 400 MG TABS tablet Take 400 mg by mouth 3 (three) times daily. 14 day course starting on 10/24/2018   Yes [provider]  ipratropium-albuterol (DUONEB) 0.5-2.5 (3) MG/3ML SOLN Take 3 mLs by nebulization every 8 (eight) hours. *May inhale every 6 hours as needed for shortness of breath/wheezing   Yes [provider]  levothyroxine (SYNTHROID) 150 MCG tablet Take 1 tablet (150 mcg total) by mouth daily before breakfast. 07/29/18 10/24/2018 Yes [name], MD  loratadine (CLARITIN) 10 MG tablet Take 10 mg by mouth daily.   Yes [provider]  Multiple Vitamin (MULTIVITAMIN WITH MINERALS) TABS tablet Take 1 tablet by mouth daily. 02/24/18  Yes Johnson, Clanford L, MD  ondansetron (ZOFRAN) 4 MG tablet Take 4 mg by mouth every 6 (six) hours as needed for nausea or vomiting.   Yes [provider]  oxyCODONE (ROXICODONE) 5 MG immediate release tablet Take 1 tablet (5 mg total) by mouth every 6 (six) hours as needed for severe pain. 09/02/18 09/02/19 Yes Angiulli, Lavon Paganini, PA-C  pantoprazole (PROTONIX) 40 MG tablet Take 1 tablet (40 mg total) by mouth daily. Patient taking differently: Take 40 mg by mouth daily.  08/09/18  Yes [name], MD  polyethylene glycol (MIRALAX / GLYCOLAX) 17 g packet Take 17 g by mouth daily. 09/03/18  Yes Angiulli, Lavon Paganini, PA-C  rivaroxaban (XARELTO) 20 MG TABS tablet Take 1 tablet (20 mg total) by mouth daily with supper. Patient taking differently: Take 20 mg by mouth every morning.  07/19/18  Yes Diallo, Abdoulaye, MD  sertraline (ZOLOFT) 50 MG tablet Take 50 mg by mouth daily.  12/24/17  Yes [provider]    Family History Family History  Problem Relation Age of Onset  . Hypertension Mother   . Diabetes Mother   . Hypertension Father   . Stroke Father   . Diabetes  Father   . Diabetes Sister   . Diabetes Sister     Social History Social History   Tobacco Use  . Smoking status: Never Smoker  . Smokeless tobacco: Never Used  Substance Use Topics  . Alcohol use: No  .  Drug use: No     Allergies   Bee venom, Heparin, Keflex [cephalexin], Tramadol, Gabapentin, Hydrochlorothiazide, Hydrocodone, Oxycodone-acetaminophen, Sorbitol, and Tizanidine   Review of Systems Review of Systems  Constitutional: Negative for appetite change and fatigue.  HENT: Negative for congestion, ear discharge and sinus pressure.   Eyes: Negative for discharge.  Respiratory: Positive for shortness of breath. Negative for cough.   Cardiovascular: Negative for chest pain.  Gastrointestinal: Negative for abdominal pain and diarrhea.  Genitourinary: Negative for frequency and hematuria.  Musculoskeletal: Negative for back pain.  Skin: Negative for rash.  Neurological: Negative for seizures and headaches.  Psychiatric/Behavioral: Negative for hallucinations.     Physical Exam Updated Vital Signs BP 103/63   Pulse 86   Temp (!) 97.5 F (36.4 C) (Oral)   Resp (!) 23   Ht 5\' 11"  (1.803 m)   Wt 87.1 kg   LMP 10/22/2016 (LMP Unknown)   SpO2 91%   BMI 26.78 kg/m   Physical Exam Vitals signs and nursing note reviewed.  Constitutional:      Appearance: She is well-developed.  HENT:     Head: Normocephalic.     Nose: Nose normal.  Eyes:     General: No scleral icterus.    Conjunctiva/sclera: Conjunctivae normal.  Neck:     Musculoskeletal: Neck supple.     Thyroid: No thyromegaly.  Cardiovascular:     Rate and Rhythm: Normal rate and regular rhythm.     Heart sounds: No murmur. No friction rub. No gallop.   Pulmonary:     Breath sounds: No stridor. Rales present. No wheezing.  Chest:     Chest wall: No tenderness.  Abdominal:     General: There is no distension.     Tenderness: There is no abdominal tenderness. There is no rebound.  Musculoskeletal:  Normal range of motion.  Lymphadenopathy:     Cervical: No cervical adenopathy.  Skin:    Findings: No erythema or rash.  Neurological:     Mental Status: She is alert and oriented to person, place, and time.     Motor: No abnormal muscle tone.     Coordination: Coordination normal.  Psychiatric:        Behavior: Behavior normal.      ED Treatments / Results  Labs (all labs ordered are listed, but only abnormal results are displayed) Labs Reviewed  COMPREHENSIVE METABOLIC PANEL - Abnormal; Notable for the following components:      Result Value   Calcium 7.6 (*)    Total Protein 6.0 (*)    Albumin 2.3 (*)    AST 130 (*)    Alkaline Phosphatase 178 (*)    All other components within normal limits  CBC WITH DIFFERENTIAL/PLATELET - Abnormal; Notable for the following components:   RBC 2.85 (*)    Hemoglobin 8.3 (*)    HCT 28.5 (*)    MCHC 29.1 (*)    RDW 18.6 (*)    Lymphs Abs 0.5 (*)    Abs Immature Granulocytes 0.16 (*)    All other components within normal limits  APTT - Abnormal; Notable for the following components:   aPTT 46 (*)    All other components within normal limits  PROTIME-INR - Abnormal; Notable for the following components:   Prothrombin Time 26.9 (*)    INR 2.5 (*)    All other components within normal limits  CULTURE, BLOOD (ROUTINE X 2)  CULTURE, BLOOD (ROUTINE X 2)  URINE CULTURE  SARS  CORONAVIRUS 2 (HOSPITAL ORDER, [place] [facility])  LACTIC ACID, PLASMA  LACTIC ACID, PLASMA  URINALYSIS, ROUTINE W REFLEX MICROSCOPIC    EKG None  Radiology Dg Chest Port 1 View  Result Date: 11/12/2018 CLINICAL DATA:  Shortness of breath, diagnosed with pneumonia 1 day prior EXAM: PORTABLE CHEST 1 VIEW COMPARISON:  Radiograph Aug 09, 2018 FINDINGS: Bilateral airspace disease most pronounced at the left lung base with associated volume loss. No pneumothorax. No effusion. Cardiomediastinal contours are unremarkable portable technique and  hypoventilatory changes. Degenerative changes are present in the spine. Cervicothoracic fusion hardware is partially included in the imaging without visible complication. No acute osseous or soft tissue abnormality. IMPRESSION: Bilateral airspace disease compatible with multifocal pneumonia. Electronically Signed   By: Lovena Le M.D.   On: 10/30/2018 17:35    Procedures Procedures (including critical care time)  Medications Ordered in ED Medications  albuterol (VENTOLIN HFA) 108 (90 Base) MCG/ACT inhaler 4 puff (4 puffs Inhalation Given 10/30/2018 1814)  vancomycin (VANCOCIN) IVPB 1000 mg/200 mL premix (1,000 mg Intravenous New Bag/Given 11/19/2018 1726)  piperacillin-tazobactam (ZOSYN) IVPB 3.375 g (3.375 g Intravenous New Bag/Given 11/20/2018 1752)  methylPREDNISolone sodium succinate (SOLU-MEDROL) 125 mg/2 mL injection 125 mg (125 mg Intravenous Given 10/23/2018 1814)     Initial Impression / Assessment and Plan / ED Course  I have reviewed the triage vital signs and the nursing notes.  Pertinent labs & imaging results that were available during my care of the patient were reviewed by me and considered in my medical decision making (see chart for details). KAILENE STEINHART was evaluated in Emergency Department on 11/10/2018 for the symptoms described in the history of present illness. She was evaluated in the context of the global COVID-19 pandemic, which necessitated consideration that the patient might be at risk for infection with the SARS-CoV-2 virus that causes COVID-19. Institutional protocols and algorithms that pertain to the evaluation of patients at risk for COVID-19 are in a state of rapid change based on information released by regulatory bodies including the CDC and federal and state organizations. These policies and algorithms were followed during the patient's care in the ED. [name] CRITICAL CARE Performed by: Milton Ferguson Total critical care time:45 minutes Critical care time was exclusive  of separately billable procedures and treating other patients. Critical care was necessary to treat or prevent imminent or life-threatening deterioration. Critical care was time spent personally by me on the following activities: development of treatment plan with patient and/or surrogate as well as nursing, discussions with consultants, evaluation of patient's response to treatment, examination of patient, obtaining history from patient or surrogate, ordering and performing treatments and interventions, ordering and review of laboratory studies, ordering and review of radiographic studies, pulse oximetry and re-evaluation of patient's condition.   Patient will be admitted for pneumonia COVID test pending     .   Final Clinical Impressions(s) / ED Diagnoses   Final diagnoses:  Healthcare-associated pneumonia    ED Discharge Orders    None       Milton Ferguson, MD 11/03/2018 [phone]

## 2018-10-25 NOTE — ED Triage Notes (Signed)
Pt arrived via EMS from [place]. States she was diagnosed with PNA yesterday, but has not started antibiotics. Increased work of breathing and congestion noted. On 4L [place]. No other complaints at this time.

## 2018-10-25 NOTE — Progress Notes (Signed)
Pharmacy Antibiotic Note  Sarah [name] is a 53 y.o. female admitted on 10/28/2018 with pneumonia.  Pharmacy has been consulted for Vancomycin and Cefepime dosing.  Plan: Vancomycin 1000 mg  IV every 24 hours.  Goal trough 15-20 mcg/mL. Similar dose to previous admission Zosyn 3.375g IV every 8 hours. Monitor labs, c/s, and vanco levels as indicated.  Height: 5\' 11"  (180.3 cm) Weight: 192 lb (87.1 kg) IBW/kg (Calculated) : 70.8  Temp (24hrs), Avg:97.5 F (36.4 C), Min:97.5 F (36.4 C), Max:97.5 F (36.4 C)  Recent Labs  Lab 10/24/2018 1656  WBC 6.9  CREATININE 0.86  LATICACIDVEN 0.6    Estimated Creatinine Clearance: 93.4 mL/min (by C-G formula based on SCr of 0.86 mg/dL).    Allergies  Allergen Reactions  . Bee Venom Anaphylaxis  . Heparin Other (See Comments)    Weak positive platelets induced antibodies, SRA negative--2015  . Keflex [Cephalexin] Nausea And Vomiting  . Tramadol Nausea And Vomiting  . Gabapentin Nausea And Vomiting  . Hydrochlorothiazide Nausea And Vomiting  . Hydrocodone Nausea And Vomiting  . Oxycodone-Acetaminophen Nausea And Vomiting  . Sorbitol Nausea Only    And abdominal distress  . Tizanidine     Fatigue     Antimicrobials this admission: Vanco 8/3 >>  Zosyn 8/3 >>   Dose adjustments this admission: N/A  Microbiology results: 8/3 BCx: pending 8/3 UCx: pending   8/3 MRSA PCR: pending  Thank you for allowing pharmacy to be a part of this patient's care.  Ramond Craver 10/24/2018 10:44 PM

## 2018-10-25 NOTE — H&P (Signed)
Patient Demographics:    Sarah [name], is a 53 y.o. 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 ([place])    1)Possible HCAP with hypoxia --- patient presenting from SNF with bilateral multifocal pneumonia--- she has a cephalosporin allergy--treat empirically with Vanco and Zosyn pending cultures COVID 19 Neg --Mucolytics, bronchodilators and supplemental oxygen as ordered -Patient does not meet sepsis criteria No leukocytosis, no fevers, lactic acid is not elevated,  2) history of pulmonary embolism--- continue Xarelto  3) acute hypoxic respiratory failure--- secondary to #1 above, treat as above #1  4)Hurthle Cell Cancer-history of thyroid malignancy with metastasis to thoracic spine----patient sees 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 status post recurrence of T3/T4 spinal tumor with resection June 2019----which previously rehab ----Patient has neurogenic bowel and neurogenic bladder secondary to #5 above--- continue bowel regimen, maintain Foley catheter --Continue baclofen  6) possible UTI in a patient with neurogenic bladder secondary to #5 above--- please change Foley catheter prior to discharge back to SNF--- currently on Vanco and Zosyn for pneumonia , await urine culture,  urine may just be colonized/contaminated  7) borderline hypotension----suspect  related to spinal cord injury/metastasis to the spine with autonomic dysfunction and hemodynamic instability--- continue Florinef-  8) chronic anemia--multifactorial, hemoglobin appears to be at baseline, continue iron sulfate, no evidence of ongoing bleeding at this time  9) depression and anxiety--use trazodone for sleep, continue Zoloft 50 mg daily  10)Elevated AST and alk phos-- ??? Etiology.... Repeat CMP in a.m.  With History of - Reviewed by me  Past Medical History:  Diagnosis Date   Anxiety    Cancer ([place])    FNA of thyroid positive for onconytic/hurthle cell carcinoma   Chronic back pain    Complication of anesthesia    Nausea and vomiting   DDD (degenerative disc disease), cervical    Depression    DJD (degenerative joint disease)    Family history of adverse reaction to anesthesia    MOTHER HAS NAUSEA    GERD (gastroesophageal reflux disease)    History of blood transfusion    08/2017   History of rectal fissure    HIT (heparin-induced thrombocytopenia) (HCC)    Hypertension    05/27/2018- not current- runs low   Hypothyroidism    Obesity    Paraplegia at T4 level [facility])    Pneumonia 03/2018   Pulmonary embolus, right ([place]) 2015   Rotator cuff tendonitis right      Past Surgical History:  Procedure  Laterality Date   APPLICATION OF CRANIAL NAVIGATION N/A 06/01/2018   Procedure: APPLICATION OF CRANIAL NAVIGATION;  Surgeon: Erline Levine, MD;  Location: [place];  Service: Neurosurgery;  Laterality: N/A;   CHOLECYSTECTOMY N/A 01/25/2018   Procedure: LAPAROSCOPIC CHOLECYSTECTOMY;  Surgeon: Virl Cagey, MD;  Location: [facility];  Service: General;  Laterality: N/A;   CRANIOTOMY N/A 06/01/2018   Procedure: Craniectomy for skull metastasis with Brainlab;  Surgeon: Erline Levine, MD;  Location: [place];  Service: Neurosurgery;  Laterality: N/A;   FEMUR IM NAIL Left 09/11/2017   Procedure: INTRAMEDULLARY (IM) RETROGRADE FEMORAL NAILING;  Surgeon:  Altamese Sachse, MD;  Location: [place];  Service: Orthopedics;  Laterality: Left;   LAMINECTOMY N/A 12/14/2013   Procedure: THORACIC LAMINECTOMY FOR TUMOR THORACIC THREE;  Surgeon: Ashok Pall, MD;  Location: [place] [facility];  Service: Neurosurgery;  Laterality: N/A;  THORACIC LAMINECTOMY FOR TUMOR THORACIC THREE   LAMINECTOMY N/A 07/05/2015   Procedure: THORACIC LAMINECTOMY FOR TUMOR;  Surgeon: Ashok Pall, MD;  Location: [place] [facility];  Service: Neurosurgery;  Laterality: N/A;   LAMINECTOMY N/A 09/03/2017   Procedure: POSTERIOR SPINAL TUMOR RESECTION THORACIC THREE;  Surgeon: Ashok Pall, MD;  Location: [place];  Service: Neurosurgery;  Laterality: N/A;  POSTERIOR SPINAL TUMOR RESECTION THORACIC THREE   POSTERIOR LUMBAR FUSION 4 LEVEL N/A 12/30/2013   Procedure: Thoracic one-Thoracic five posterior thoracic fusion with pedicle screws;  Surgeon: Ashok Pall, MD;  Location: [place] [facility];  Service: Neurosurgery;  Laterality: N/A;  Thoracic one-Thoracic five posterior thoracic fusion with pedicle screws   THYROIDECTOMY N/A 01/09/2014   Procedure: TOTAL THYROIDECTOMY;  Surgeon: Izora Gala, MD;  Location: [place] [facility];  Service: ENT;  Laterality: N/A;   TONSILLECTOMY     CC  shob/hypoxia   HPI:    Sarah [name]  is a 53 y.o. female  With  history of hypertension, CKD stage III, pulmonary emboli maintained on Xarelto, myelopathy and paraplegia secondary to metastatic Hurthle cancer to the thoracic spine status post recurrence of T3/T4 spinal tumor with resection June 2019 followed by Dr.Kale   --Patient presents [facility] with complaints of worsening cough and shortness of breath -She was apparently diagnosed with pneumonia on 10/24/2018 but she has not started antibiotics yet --Respiratory symptoms worsened hence she was brought by EMS to the ED today 11/01/2018  In ED no fevers, soft BP noted, patient is noted to be hypoxic with O2 sats of 87% on 2 L oxygen increased to 4 L via nasal cannula and O2  sats came up to 93%  No vomiting no diarrhea  In ED chest x-ray suggest bilateral multifocal pneumonia COVID-19 test is negative UA is suggestive of UTI but patient has not Foley catheter Lactic acid is 0.6 -Creatinine 0.8, AST is up to 130, ALT is 31 alk phos is 178.Albumin of 2.3 --WBC 6.9 H&H 8.3 and 28.5   Review of systems:    In addition to the HPI above,   A full Review of  Systems was done, all other systems reviewed are negative except as noted above in HPI , .    Social History:  Reviewed by me    Social History   Tobacco Use   Smoking status: Never Smoker   Smokeless tobacco: Never Used  Substance Use Topics   Alcohol use: No     Family History :  Reviewed by me    Family History  Problem Relation Age of Onset   Hypertension Mother    Diabetes  Mother    Hypertension Father    Stroke Father    Diabetes Father    Diabetes Sister    Diabetes Sister      Home Medications:   Prior to Admission medications   Medication Sig Start Date End Date Taking? Authorizing Provider  acetaminophen (TYLENOL) 650 MG CR tablet Take 1,300 mg by mouth every 6 (six) hours as needed for pain.   Yes [provider]  Amino Acids-Protein Hydrolys (PRO-STAT PO) Take 30 mLs by mouth 2 (two) times daily.   Yes [provider]  baclofen (LIORESAL) 20 MG tablet Take 1 tablet (20 mg total) by mouth 3 (three) times daily. 09/02/18  Yes Angiulli, Lavon Paganini, PA-C  bisacodyl (DULCOLAX) 10 MG suppository Place 1 suppository (10 mg total) rectally daily. Patient taking differently: Place 10 mg rectally daily as needed for mild constipation or moderate constipation.  09/02/18  Yes Angiulli, Lavon Paganini, PA-C  diclofenac sodium (VOLTAREN) 1 % GEL Apply 2 g topically 3 (three) times daily. Patient taking differently: Apply 2 g topically 2 (two) times daily. Left shoulder 09/02/18  Yes Angiulli, Lavon Paganini, PA-C  ferrous sulfate 325 (65 FE) MG tablet Take 1 tablet (325 mg  total) by mouth every other day. 08/17/18  Yes [name], MD  fludrocortisone (FLORINEF) 0.1 MG tablet Take 1 tablet (0.1 mg total) by mouth daily. 09/03/18  Yes Angiulli, Lavon Paganini, PA-C  fluticasone (FLONASE) 50 MCG/ACT nasal spray Place 1 spray into both nostrils daily.   Yes [provider]  guaifenesin (HUMIBID E) 400 MG TABS tablet Take 400 mg by mouth 3 (three) times daily. 14 day course starting on 10/24/2018   Yes [provider]  ipratropium-albuterol (DUONEB) 0.5-2.5 (3) MG/3ML SOLN Take 3 mLs by nebulization every 8 (eight) hours. *May inhale every 6 hours as needed for shortness of breath/wheezing   Yes [provider]  levothyroxine (SYNTHROID) 150 MCG tablet Take 1 tablet (150 mcg total) by mouth daily before breakfast. 07/29/18 10/24/2018 Yes [name], MD  loratadine (CLARITIN) 10 MG tablet Take 10 mg by mouth daily.   Yes [provider]  Multiple Vitamin (MULTIVITAMIN WITH MINERALS) TABS tablet Take 1 tablet by mouth daily. 02/24/18  Yes Johnson, Clanford L, MD  ondansetron (ZOFRAN) 4 MG tablet Take 4 mg by mouth every 6 (six) hours as needed for nausea or vomiting.   Yes [provider]  oxyCODONE (ROXICODONE) 5 MG immediate release tablet Take 1 tablet (5 mg total) by mouth every 6 (six) hours as needed for severe pain. 09/02/18 09/02/19 Yes Angiulli, Lavon Paganini, PA-C  pantoprazole (PROTONIX) 40 MG tablet Take 1 tablet (40 mg total) by mouth daily. Patient taking differently: Take 40 mg by mouth daily.  08/09/18  Yes [name], MD  polyethylene glycol (MIRALAX / GLYCOLAX) 17 g packet Take 17 g by mouth daily. 09/03/18  Yes Angiulli, Lavon Paganini, PA-C  rivaroxaban (XARELTO) 20 MG TABS tablet Take 1 tablet (20 mg total) by mouth daily with supper. Patient taking differently: Take 20 mg by mouth every morning.  07/19/18  Yes Diallo, Abdoulaye, MD  sertraline (ZOLOFT) 50 MG tablet Take 50 mg by mouth daily.  12/24/17  Yes [provider]     Allergies:     Allergies  Allergen Reactions   Bee Venom Anaphylaxis   Heparin Other (See Comments)    Weak positive platelets induced antibodies, SRA negative--2015   Keflex [Cephalexin] Nausea And Vomiting  Tramadol Nausea And Vomiting   Gabapentin Nausea And Vomiting   Hydrochlorothiazide Nausea And Vomiting   Hydrocodone Nausea And Vomiting   Oxycodone-Acetaminophen Nausea And Vomiting   Sorbitol Nausea Only    And abdominal distress   Tizanidine     Fatigue      Physical Exam:   Vitals  Blood pressure (!) 90/59, pulse 80, temperature (!) 97.5 F (36.4 C), temperature source Oral, resp. rate 20, height _0  (1.803 m), weight 87.1 kg, last menstrual period 10/22/2016, SpO2 93 %.  Physical Examination: General appearance - alert, chronically ill appearing, and in no distress Mental status - alert, oriented to person, place, and time,  Eyes - sclera anicteric Nose- [place] 4 L/min Neck - supple, no JVD elevation , Chest -diminished in bases with scattered rhonchi, Heart - S1 and S2 normal, regular  Abdomen - soft, nontender, nondistended, no masses or organomegaly, no CVA area tenderness Neurological - screening mental status exam normal, neuromuscular deficits consistent with paraplegic status--appears to be at her baseline, lower extremity is much weaker than upper extremities  extremities - no pedal edema noted, intact peripheral pulses  Skin - warm, dry GU--Foley with dark urine    Data Review:    CBC Recent Labs  Lab 11/07/2018 1656  WBC 6.9  HGB 8.3*  HCT 28.5*  PLT 199  MCV 100.0  MCH 29.1  MCHC 29.1*  RDW 18.6*  LYMPHSABS 0.5*  MONOABS 0.2  EOSABS 0.0  BASOSABS 0.0   ------------------------------------------------------------------------------------------------------------------  Chemistries  Recent Labs  Lab 11/13/2018 1656  NA 141  K 3.7  CL 106  CO2 23  GLUCOSE 73  BUN 18  CREATININE 0.86  CALCIUM 7.6*  AST 130*    ALT 31  ALKPHOS 178*  BILITOT 1.0   ------------------------------------------------------------------------------------------------------------------ estimated creatinine clearance is 93.4 mL/min (by C-G formula based on SCr of 0.86 mg/dL). ------------------------------------------------------------------------------------------------------------------ No results for input(s): TSH, T4TOTAL, T3FREE, THYROIDAB in the last 72 hours.  Invalid input(s): FREET3   Coagulation profile Recent Labs  Lab 10/23/2018 1656  INR 2.5*   ------------------------------------------------------------------------------------------------------------------- No results for input(s): DDIMER in the last 72 hours. -------------------------------------------------------------------------------------------------------------------  Cardiac Enzymes No results for input(s): CKMB, TROPONINI, MYOGLOBIN in the last 168 hours.  Invalid input(s): CK ------------------------------------------------------------------------------------------------------------------ No results found for: BNP  Urinalysis    Component Value Date/Time   COLORURINE YELLOW 11/07/2018 1826   APPEARANCEUR HAZY (A) 11/18/2018 1826   LABSPEC 1.013 11/09/2018 1826   PHURINE 6.0 10/23/2018 1826   GLUCOSEU NEGATIVE 10/26/2018 1826   HGBUR NEGATIVE 10/26/2018 1826   BILIRUBINUR NEGATIVE 10/26/2018 1826   BILIRUBINUR MODERATE 03/03/2014 1028   KETONESUR 20 (A) 11/11/2018 1826   PROTEINUR NEGATIVE 11/05/2018 1826   UROBILINOGEN 1.0 03/11/2014 2130   NITRITE POSITIVE (A) 11/22/2018 1826   LEUKOCYTESUR MODERATE (A) 11/13/2018 1826    ----------------------------------------------------------------------------------------------------------------   Imaging Results:    Dg Chest Port 1 View  Result Date: 11/14/2018 CLINICAL DATA:  Shortness of breath, diagnosed with pneumonia 1 day prior EXAM: PORTABLE CHEST 1 VIEW COMPARISON:  Radiograph  Aug 09, 2018 FINDINGS: Bilateral airspace disease most pronounced at the left lung base with associated volume loss. No pneumothorax. No effusion. Cardiomediastinal contours are unremarkable portable technique and hypoventilatory changes. Degenerative changes are present in the spine. Cervicothoracic fusion hardware is partially included in the imaging without visible complication. No acute osseous or soft tissue abnormality. IMPRESSION: Bilateral airspace disease compatible with multifocal pneumonia. Electronically Signed   By: Elwin Sleight.D.  On: 11/07/2018 17:35    Radiological Exams on Admission: Dg Chest Port 1 View  Result Date: 11/01/2018 CLINICAL DATA:  Shortness of breath, diagnosed with pneumonia 1 day prior EXAM: PORTABLE CHEST 1 VIEW COMPARISON:  Radiograph Aug 09, 2018 FINDINGS: Bilateral airspace disease most pronounced at the left lung base with associated volume loss. No pneumothorax. No effusion. Cardiomediastinal contours are unremarkable portable technique and hypoventilatory changes. Degenerative changes are present in the spine. Cervicothoracic fusion hardware is partially included in the imaging without visible complication. No acute osseous or soft tissue abnormality. IMPRESSION: Bilateral airspace disease compatible with multifocal pneumonia. Electronically Signed   By: Lovena Le M.D.   On: 11/05/2018 17:35    DVT Prophylaxis -SCD /Xarelto AM Labs Ordered, also please review Full Orders  Family Communication: Admission, patients condition and plan of care including tests being ordered have been discussed with the patient who indicate understanding and agree with the plan   Code Status - Full Code  Likely DC to  SNF  Condition   stable  Roxan Hockey M.D on 10/31/2018 at 8:14 PM Go to [email or web address] -  for contact info  [facility] - Office  [phone]

## 2018-10-26 ENCOUNTER — Encounter (HOSPITAL_COMMUNITY): Payer: Self-pay | Admitting: *Deleted

## 2018-10-26 ENCOUNTER — Other Ambulatory Visit: Payer: Self-pay

## 2018-10-26 ENCOUNTER — Inpatient Hospital Stay (HOSPITAL_COMMUNITY): Payer: Medicare Other

## 2018-10-26 DIAGNOSIS — G959 Disease of spinal cord, unspecified: Secondary | ICD-10-CM

## 2018-10-26 DIAGNOSIS — J9601 Acute respiratory failure with hypoxia: Secondary | ICD-10-CM

## 2018-10-26 DIAGNOSIS — K592 Neurogenic bowel, not elsewhere classified: Secondary | ICD-10-CM

## 2018-10-26 DIAGNOSIS — J181 Lobar pneumonia, unspecified organism: Secondary | ICD-10-CM

## 2018-10-26 LAB — COMPREHENSIVE METABOLIC PANEL
ALT: 27 U/L (ref 0–44)
AST: 92 U/L — ABNORMAL HIGH (ref 15–41)
Albumin: 2.2 g/dL — ABNORMAL LOW (ref 3.5–5.0)
Alkaline Phosphatase: 156 U/L — ABNORMAL HIGH (ref 38–126)
Anion gap: 18 — ABNORMAL HIGH (ref 5–15)
BUN: 22 mg/dL — ABNORMAL HIGH (ref 6–20)
CO2: 19 mmol/L — ABNORMAL LOW (ref 22–32)
Calcium: 7.7 mg/dL — ABNORMAL LOW (ref 8.9–10.3)
Chloride: 104 mmol/L (ref 98–111)
Creatinine, Ser: 1.05 mg/dL — ABNORMAL HIGH (ref 0.44–1.00)
GFR calc Af Amer: >60 mL/min (ref >60)
GFR calc non Af Amer: >60 mL/min (ref >60)
Glucose, Bld: 135 mg/dL — ABNORMAL HIGH (ref 70–99)
Potassium: 4.1 mmol/L (ref 3.5–5.1)
Sodium: 141 mmol/L (ref 135–145)
Total Bilirubin: 1.9 mg/dL — ABNORMAL HIGH (ref 0.3–1.2)
Total Protein: 5.7 g/dL — ABNORMAL LOW (ref 6.5–8.1)

## 2018-10-26 LAB — CBC
HCT: 27.4 % — ABNORMAL LOW (ref 36.0–46.0)
Hemoglobin: 8 g/dL — ABNORMAL LOW (ref 12.0–15.0)
MCH: 29.1 pg (ref 26.0–34.0)
MCHC: 29.2 g/dL — ABNORMAL LOW (ref 30.0–36.0)
MCV: 99.6 fL (ref 80.0–100.0)
Platelets: 201 10*3/uL (ref 150–400)
RBC: 2.75 MIL/uL — ABNORMAL LOW (ref 3.87–5.11)
RDW: 18.6 % — ABNORMAL HIGH (ref 11.5–15.5)
WBC: 12.7 10*3/uL — ABNORMAL HIGH (ref 4.0–10.5)
nRBC: 0 % (ref 0.0–0.2)

## 2018-10-26 LAB — PROCALCITONIN: Procalcitonin: 6.09 ng/mL

## 2018-10-26 LAB — MRSA PCR SCREENING: MRSA by PCR: NEGATIVE

## 2018-10-26 MED ORDER — JUVEN PO PACK
1.0000 | PACK | Freq: Two times a day (BID) | ORAL | Status: DC
  Administered 2018-10-26 – 2018-10-30 (×4): 1 via ORAL
  Filled 2018-10-26 (×7): qty 1

## 2018-10-26 NOTE — Progress Notes (Signed)
Notified MD of low BP. Received order for 500cc bolus.  Bolus given per md order.  Will notify next shift to recheck patient's vitals.  Patient alert and oriented with no complaints at this time.

## 2018-10-26 NOTE — Progress Notes (Addendum)
Initial Nutrition Assessment  DOCUMENTATION CODES:      INTERVENTION:  -Ensure Enlive po BID, each supplement provides 350 kcal and 20 grams of protein   -Recommend soft diet for comfort  -1 packet Juven BID, each packet provides 95 calories, 2.5 grams of protein (collagen), and 9.8 grams of carbohydrate    NUTRITION DIAGNOSIS:   Inadequate oral intake in the setting of increased needs related to acute illness(PNA and UTI, Sore throat) and stage III PI's as evidenced by 2 PI right/left buttock, per patient/family report(swallow difficulty due to pain) requiring soft foods and admission documentation.  GOAL: Meet >90% est nutrition needs daily to promote wound healing     MONITOR:  PO intake, Supplement acceptance, Labs, Weight trends, Skin    REASON FOR ASSESSMENT:   Malnutrition Screening Tool    ASSESSMENT: patient is a 53 yo female with history of paraplegia secondary to metastatic Hurthle cancer to thoracic spine, neurogenic bladder and constipation. Presents from SNF with possible pneumonia and UTI.   This morning patient MAP 63, BP-90/50, Temp.-97.4 (36.3).  Patient says her throat is sore and making it difficult to eat as usual. She is having soup and mashed potatoes for lunch and we talked about other soft food options. She is unable to recall her last meal. Generally follows a regular diet with no food allergies. Encouraged her to drink oral supplements while she is not able to eat as usual in order to consume essential nutrients including  Protein.  Patient has experienced significant weight loss 8.6 kg/ 9% the past 2 months. Expect patient is acutely malnourished (related to PNA and UTI). Will complete NFPE on f/u.  Medications reviewed and include: Dulcolax, Ferrous sulfate, synthroid, MVI, miralax and zoloft.  IV- antibiotics and NS @ 75 ml/hr.  Labs: BMP Latest Ref Rng & Units 10/26/2018 11/22/2018 09/01/2018  Glucose 70 - 99 mg/dL 135(H) 73 88  BUN 6 - 20 mg/dL  22(H) 18 10  Creatinine 0.44 - 1.00 mg/dL 1.05(H) 0.86 0.95  BUN/Creat Ratio 9 - 23 - - -  Sodium 135 - 145 mmol/L 141 141 137  Potassium 3.5 - 5.1 mmol/L 4.1 3.7 3.8  Chloride 98 - 111 mmol/L 104 106 99  CO2 22 - 32 mmol/L 19(L) 23 30  Calcium 8.9 - 10.3 mg/dL 7.7(L) 7.6(L) 8.1(L)     NUTRITION - FOCUSED PHYSICAL EXAM:  Unable to complete Nutrition-Focused physical exam at this time.    Diet Order:   Diet Order            Diet Heart Room service appropriate? Yes; Fluid consistency: Thin  Diet effective now              EDUCATION NEEDS:  Education needs have been addressed Skin:  Skin Assessment: Skin Integrity Issues: Skin Integrity Issues:: Stage III Stage III: to left and right buttock  Last BM:  8/4 -type 7   Height:   Ht Readings from Last 1 Encounters:  10/26/18 5\' 11"  (1.803 m)    Weight:   Wt Readings from Last 1 Encounters:  10/26/18 87.1 kg    Ideal Body Weight:  70 kg  BMI:  Body mass index is 26.78 kg/m.  Estimated Nutritional Needs:   Kcal:  1560-1716(adjusted for limited mobility)  Protein:  91-98 gr (1.3-1.4 gr/kg/ibw)  Fluid:  >1500 ml daily    Colman Cater MS,RD,CSG,LDN Office: [phone] Pager: [phone]

## 2018-10-26 NOTE — NC FL2 (Signed)
[place] LEVEL OF CARE SCREENING TOOL     IDENTIFICATION  Patient Name: Sarah [name] Birthdate: [birth year] Sex: female Admission Date (Current Location): 11/19/2018  [facility] and [place] Number:  [employer] and Address:  [facility] [address], [place]      Provider Number: [id number]  Attending Physician Name and Address:  Orson Eva, MD  Relative Name and Phone Number:       Current Level of Care: Hospital Recommended Level of Care: [place] Prior Approval Number:    Date Approved/Denied:   PASRR Number:    Discharge Plan: SNF    Current Diagnoses: Patient Active Problem List   Diagnosis Date Noted  . Bilateral/multifocal PNA (pneumonia) 11/18/2018  . CKD (chronic kidney disease), stage III ([place])   . Hypoalbuminemia due to protein-calorie malnutrition ([place])   . Hyperglycemia   . Nasal congestion   . Hypotension   . Acute on chronic anemia   . Febrile illness   . Bacterial skin infection 08/10/2018  . Eustachian tube disorder, left 06/30/2018  . Urinary tract infection 06/30/2018  . Malignant neoplasm ([place]) 06/01/2018  . Metastasis to brain ([place]) 06/01/2018  . Transaminitis 04/09/2018  . Hypoalbuminemia 04/09/2018  . Dyspnea   . Multifocal pneumonia   . Palliative care by specialist   . Goals of care, counseling/discussion   . DNR (do not resuscitate) discussion   . Fecal impaction ([place]) 02/06/2018  . HCAP (healthcare-associated pneumonia) 02/06/2018  . Anemia   . Chronic anticoagulation   . Sacral ulcer ([place]) 02/04/2018  . Fracture   . Acute lower UTI   . Myelopathy ([place]) 09/21/2017  . Muscle spasms of both lower extremities   . Closed fracture of shaft of left femur ([place])   . Acute blood loss anemia   . Hypothyroidism   . Nausea & vomiting 09/15/2017  . Closed fracture of left distal femur ([place]) 09/11/2017  . Paraplegia ([place]) 09/08/2017  . Obesity (BMI 30-39.9) 09/03/2017  .  Status post surgery 09/03/2017  . Calculus of gallbladder without cholecystitis without obstruction   . Biliary dyskinesia   . Intractable nausea and vomiting 08/09/2017  . Thrombocytopenia ([place]) 08/09/2017  . Elevated bilirubin 08/09/2017  . AKI (acute kidney injury) ([place]) 08/09/2017  . Chest pain 01/26/2017  . Upper respiratory infection, viral 01/26/2017  . Counseling regarding advanced care planning and goals of care 01/02/2017  . Bilateral rotator cuff syndrome 02/12/2016  . E. coli UTI 08/01/2015  . Constipation due to neurogenic bowel 08/01/2015  . 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 Spastic paraparesis   . Neurogenic bladder   . History of pulmonary embolism   . Post-operative pain   . Metastatic cancer ([place])   . Steroid-induced hyperglycemia   . Depression   . Obstipation   . Thoracic spine tumor 07/06/2015  . 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 ([place]) 07/06/2015  . Spinal cord compression due to malignant neoplasm metastatic to spine ([place])   . Postoperative hypothyroidism   . 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 Spasticity 09/19/2014  . Hypertension 05/01/2014  . Ingrown right big toenail 05/01/2014  . GERD (gastroesophageal reflux disease) 02/14/2014  . Dysphagia, pharyngoesophageal phase 02/06/2014  . Type 2 diabetes mellitus without complication ([place]) 09/21/1599  . Constipation   . Neurogenic bowel   . Paraplegia at T4 level ([place]) 01/20/2014  . Metastatic cancer to spine ([place]) 01/19/2014  . Rotator cuff tendonitis   .  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 Leg weakness, bilateral 12/13/2013    Orientation RESPIRATION BLADDER Height & Weight     Self, Time, Situation  Normal Incontinent Weight: 87.1 kg Height:  5\' 11"  (180.3 cm)  BEHAVIORAL SYMPTOMS/MOOD NEUROLOGICAL BOWEL NUTRITION STATUS      Incontinent Diet  AMBULATORY STATUS COMMUNICATION OF NEEDS Skin    Extensive Assist Verbally PU Stage and Appropriate Care, Other (Comment)(R. HIP- STAGE IV;  L. HIP;  PU-R. HEEL)                       Personal Care Assistance Level of Assistance  Bathing, Dressing, Feeding Bathing Assistance: Maximum assistance Feeding assistance: Independent Dressing Assistance: Limited assistance     Functional Limitations Info  Sight, Speech, Hearing Sight Info: Adequate Hearing Info: Adequate Speech Info: Adequate    SPECIAL CARE FACTORS FREQUENCY  PT (By licensed PT)     PT Frequency: 5x/week              Contractures      Additional Factors Info  Code Status, Allergies Code Status Info: FULL Allergies Info: BEE VENOM; HEPARIN; KEFLEX; TRAMADOL; GABAPENTIN; HYDROCHLOROTHIAZIDE; HYDROCODONE; OXYCODONE-ACETAMINOPHEN; SORBITOL; TIZANIDINE           Current Medications (10/26/2018):  This is the current hospital active medication list Current Facility-Administered Medications  Medication Dose Route Frequency Provider Last Rate Last Dose  . 0.9 %  sodium chloride infusion  250 mL Intravenous PRN Emokpae, Courage, MD      . 0.9 %  sodium chloride infusion   Intravenous Continuous Roxan Hockey, MD 75 mL/hr at 11/06/2018 2159    . acetaminophen (TYLENOL) tablet 650 mg  650 mg Oral Q6H PRN Emokpae, Courage, MD       Or  . acetaminophen (TYLENOL) suppository 650 mg  650 mg Rectal Q6H PRN Emokpae, Courage, MD      . albuterol (PROVENTIL) (2.5 MG/3ML) 0.083% nebulizer solution 2.5 mg  2.5 mg Nebulization Q2H PRN Emokpae, Courage, MD      . baclofen (LIORESAL) tablet 20 mg  20 mg Oral TID Roxan Hockey, MD   20 mg at 10/26/18 0920  . bisacodyl (DULCOLAX) suppository 10 mg  10 mg Rectal Daily Denton Brick, Courage, MD   10 mg at 10/26/18 0920  . feeding supplement (ENSURE ENLIVE) (ENSURE ENLIVE) liquid 237 mL  237 mL Oral BID BM Emokpae, Courage, MD   237 mL at 10/26/18 0921  . ferrous sulfate tablet 325 mg  325 mg Oral Q48H Emokpae, Courage, MD   325  mg at 10/26/18 0920  . fludrocortisone (FLORINEF) tablet 0.1 mg  0.1 mg Oral Daily Emokpae, Courage, MD   0.1 mg at 10/26/18 0921  . fluticasone (FLONASE) 50 MCG/ACT nasal spray 1 spray  1 spray Each Nare Daily Emokpae, Courage, MD   1 spray at 10/26/18 0920  . guaiFENesin (MUCINEX) 12 hr tablet 600 mg  600 mg Oral BID Denton Brick, Courage, MD   600 mg at 10/26/18 0920  . ipratropium-albuterol (DUONEB) 0.5-2.5 (3) MG/3ML nebulizer solution 3 mL  3 mL Nebulization Q6H Emokpae, Courage, MD   3 mL at 10/26/18 0724  . levothyroxine (SYNTHROID) tablet 150 mcg  150 mcg Oral Q0600 Emokpae, Courage, MD      . loratadine (CLARITIN) tablet 10 mg  10 mg Oral Daily Emokpae, Courage, MD   10 mg at 10/26/18 0920  . multivitamin with minerals tablet  1 tablet  1 tablet Oral Daily Roxan Hockey, MD   1 tablet at 10/26/18 0920  . ondansetron (ZOFRAN) tablet 4 mg  4 mg Oral Q6H PRN Emokpae, Courage, MD       Or  . ondansetron (ZOFRAN) injection 4 mg  4 mg Intravenous Q6H PRN Emokpae, Courage, MD      . ondansetron (ZOFRAN) tablet 4 mg  4 mg Oral Q6H PRN Emokpae, Courage, MD      . oxyCODONE (Oxy IR/ROXICODONE) immediate release tablet 5 mg  5 mg Oral Q6H PRN Denton Brick, Courage, MD   5 mg at 10/26/18 0920  . pantoprazole (PROTONIX) EC tablet 40 mg  40 mg Oral Daily Emokpae, Courage, MD   40 mg at 10/26/18 0920  . piperacillin-tazobactam (ZOSYN) IVPB 3.375 g  3.375 g Intravenous Q8H Emokpae, Courage, MD 12.5 mL/hr at 10/26/18 0755 3.375 g at 10/26/18 0755  . polyethylene glycol (MIRALAX / GLYCOLAX) packet 17 g  17 g Oral Daily Emokpae, Courage, MD   17 g at 10/26/18 0920  . polyethylene glycol (MIRALAX / GLYCOLAX) packet 17 g  17 g Oral Daily PRN Emokpae, Courage, MD      . rivaroxaban (XARELTO) tablet 20 mg  20 mg Oral Q supper Emokpae, Courage, MD      . sertraline (ZOLOFT) tablet 50 mg  50 mg Oral Daily Emokpae, Courage, MD   50 mg at 10/26/18 0920  . sodium chloride flush (NS) 0.9 % injection 3 mL  3 mL Intravenous  Q12H Emokpae, Courage, MD   3 mL at 10/26/18 0921  . sodium chloride flush (NS) 0.9 % injection 3 mL  3 mL Intravenous PRN Emokpae, Courage, MD      . traZODone (DESYREL) tablet 50 mg  50 mg Oral QHS PRN Emokpae, Courage, MD      . vancomycin (VANCOCIN) IVPB 1000 mg/200 mL premix  1,000 mg Intravenous Q24H Roxan Hockey, MD         Discharge Medications: Please see discharge summary for a list of discharge medications.  Relevant Imaging Results:  Relevant Lab Results:   Additional Information SS# [place]  Kallie Depolo, Chauncey Reading, RN

## 2018-10-26 NOTE — Progress Notes (Signed)
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 paraplegia and sacral decubitus ulcer presenting with one week of sob from [place] that worsened over 24 hours prior to admission.  Apparently, the patient had been diagnosed with pneumonia on 11/27/6211 at [facility], but had not yet been started on antibiotics.  At baseline she is nonambulatory and transfers with sliding board.  The patient has had numerous recent hospitalizations including from 08/09/2018 to 08/17/2018 during which time she was admitted for an infected sacral decubitus ulcer.  She had a debridement on 08/10/2018 by general surgery and was discharged to finish a short course of clindamycin.  CT of the pelvis was negative for osteomyelitis at that time.  She subsequently stayed at [facility] from 08/16/2020 of 09/03/2018 after which she was discharged to [facility].  The patient had complained of some subjective fever and chills, but denied any vomiting, diarrhea, abdominal pain, headache, neck pain, hemoptysis.  In the emergency department, the patient was afebrile with soft blood pressures in the 90s.  Oxygen saturation was initially 87% on 2 L, and improved to 94% on 4 L.  Chest x-ray revealed increasing bibasilar opacities concerning for pneumonia.  As result, the patient was started on vancomycin and cefepime and admitted for further treatment and evaluation.  Assessment/Plan: Acute respiratory failure with hypoxia -Secondary to pneumonia -Presently stable on 4 L -Wean oxygen for saturation greater than 92% -SARS-CoV2 neg  HCAP -MRSA PCR neg -d/c vanco -continue zosyn -PCT 6.09 -WBC increased in part due to patient receiving IV  steroids on 11/10/2018  Hypotension -This has been a chronic issue for the patient with labile blood pressures -Likely due to the patient's dysautonomia from her spinal cord metastasis/paraplegia -Continue Florinef -follow blood cultures and urine culture  myelopathy and paraplegia -secondary to metastatic Hurthle cancerto the thoracic spine status post recurrence of T3/T4 spinal tumor with resection June 2019 -Continue baclofen -Patient has neurogenic bladder and bowel  Stage IV sacral decubitus ulcer -Personally examined wound-->100% granulation without signs of infection -Present on admission -continue daily wet-to-dry dressings  Transaminasemia -likely due to hepatic congestion from infx -RUQ [place]  Hurthle Cell Carcinoma -follow up [name]  Hypothyroidism -Continue Synthroid  History of pulmonary embolus -Continue rivaroxaban  Depression -Continue Zoloft  Anemia of chronic disease -Baseline hemoglobin~8 -Continue iron supplementation             Disposition Plan:   SNF in 1-2 days  Family Communication:   No Family at bedside  Consultants:  none  Code Status:  FULL   DVT Prophylaxis:  Xarelto   Procedures: As Listed in Progress Note Above  Antibiotics: Zosyn 8/3>>> vanco 8/3>>8/4  RN Pressure Injury Documentation:       Subjective: Patient denies fevers, chills, headache, chest pain,  nausea, vomiting, diarrhea, abdominal pain, dysuria, hematuria, hematochezia, and melena. She still has sob, but states it is a little better than yesterday  Objective: Vitals:   10/26/18 0553 10/26/18 0724 10/26/18 0737 10/26/18 0756  BP: (!) 80/50  (!) 87/53 (!) 90/50  Pulse: 86  85   Resp: 20     Temp: (!) 97.4 F (36.3 C)  TempSrc: Oral     SpO2: 97% 98%    Weight:      Height:        Intake/Output Summary (Last 24 hours) at 10/26/2018 1141 Last data filed at 10/26/2018 0302 Gross per 24 hour  Intake 449.67 ml  Output -  Net 449.67 ml    Weight change:  Exam:   General:  Pt is alert, follows commands appropriately, not in acute distress  HEENT: No icterus, No thrush, No neck mass, [place]/AT  Cardiovascular: RRR, S1/S2, no rubs, no gallops  Respiratory: bibasilar rales R>L. No wheeze  Abdomen: Soft/+BS, non tender, non distended, no guarding  Extremities: 1+ LE edema, No lymphangitis, No petechiae, No rashes, no synovitis  -Stage IV sacral decubitus ulcer without any current drainage, necrosis, lymphangitis.  100% granulation tissue.   Data Reviewed: I have personally reviewed following labs and imaging studies Basic Metabolic Panel: Recent Labs  Lab 10/30/2018 1656 10/26/18 0607  NA 141 141  K 3.7 4.1  CL 106 104  CO2 23 19*  GLUCOSE 73 135*  BUN 18 22*  CREATININE 0.86 1.05*  CALCIUM 7.6* 7.7*   Liver Function Tests: Recent Labs  Lab 11/13/2018 1656 10/26/18 0607  AST 130* 92*  ALT 31 27  ALKPHOS 178* 156*  BILITOT 1.0 1.9*  PROT 6.0* 5.7*  ALBUMIN 2.3* 2.2*   No results for input(s): LIPASE, AMYLASE in the last 168 hours. No results for input(s): AMMONIA in the last 168 hours. Coagulation Profile: Recent Labs  Lab 11/11/2018 1656  INR 2.5*   CBC: Recent Labs  Lab 11/02/2018 1656 10/26/18 0607  WBC 6.9 12.7*  NEUTROABS 6.0  --   HGB 8.3* 8.0*  HCT 28.5* 27.4*  MCV 100.0 99.6  PLT 199 201   Cardiac Enzymes: No results for input(s): CKTOTAL, CKMB, CKMBINDEX, TROPONINI in the last 168 hours. BNP: Invalid input(s): POCBNP CBG: No results for input(s): GLUCAP in the last 168 hours. HbA1C: No results for input(s): HGBA1C in the last 72 hours. Urine analysis:    Component Value Date/Time   COLORURINE YELLOW 11/11/2018 1826   APPEARANCEUR HAZY (A) 11/13/2018 1826   LABSPEC 1.013 10/28/2018 1826   PHURINE 6.0 11/16/2018 1826   GLUCOSEU NEGATIVE 11/05/2018 1826   HGBUR NEGATIVE 11/01/2018 1826   BILIRUBINUR NEGATIVE 10/31/2018 1826   BILIRUBINUR MODERATE 03/03/2014 1028   KETONESUR 20  (A) 10/24/2018 1826   PROTEINUR NEGATIVE 11/19/2018 1826   UROBILINOGEN 1.0 03/11/2014 2130   NITRITE POSITIVE (A) 11/13/2018 1826   LEUKOCYTESUR MODERATE (A) 11/08/2018 1826   Sepsis Labs: @LABRCNTIP (procalcitonin:4,lacticidven:4) ) Recent Results (from the past 240 hour(s))  Blood Culture (routine x 2)     Status: None (Preliminary result)   Collection Time: 10/26/2018  4:56 PM   Specimen: Right Antecubital; Blood  Result Value Ref Range Status   Specimen Description   Final    RIGHT ANTECUBITAL BOTTLES DRAWN AEROBIC AND ANAEROBIC   Special Requests   Final    Blood Culture results may not be optimal due to an excessive volume of blood received in culture bottles   Culture   Final    NO GROWTH < 24 HOURS Performed at [facility], [address], [place], [place] [address]    Report Status PENDING  Incomplete  Blood Culture (routine x 2)     Status: None (Preliminary result)   Collection Time: 10/23/2018  5:06 PM   Specimen: Left Antecubital; Blood  Result Value Ref Range Status   Specimen Description  Final    LEFT ANTECUBITAL BOTTLES DRAWN AEROBIC AND ANAEROBIC   Special Requests Blood Culture adequate volume  Final   Culture   Final    NO GROWTH < 24 HOURS Performed at [facility], [address] [place], [place] [address]    Report Status PENDING  Incomplete  SARS Coronavirus 2 [facility] order, Performed in [facility] hospital lab) Nasopharyngeal Nasopharyngeal Swab     Status: None   Collection Time: 11/08/2018  5:36 PM   Specimen: Nasopharyngeal Swab  Result Value Ref Range Status   SARS Coronavirus 2 NEGATIVE NEGATIVE Final    Comment: (NOTE) If result is NEGATIVE SARS-CoV-2 target nucleic acids are NOT DETECTED. The SARS-CoV-2 RNA is generally detectable in upper and lower  respiratory specimens during the acute phase of infection. The lowest  concentration of SARS-CoV-2 viral copies this assay can detect is 250  copies / mL. A negative result does not preclude  SARS-CoV-2 infection  and should not be used as the sole basis for treatment or other  patient management decisions.  A negative result may occur with  improper specimen collection / handling, submission of specimen other  than nasopharyngeal swab, presence of viral mutation(s) within the  areas targeted by this assay, and inadequate number of viral copies  (<250 copies / mL). A negative result must be combined with clinical  observations, patient history, and epidemiological information. If result is POSITIVE SARS-CoV-2 target nucleic acids are DETECTED. The SARS-CoV-2 RNA is generally detectable in upper and lower  respiratory specimens dur ing the acute phase of infection.  Positive  results are indicative of active infection with SARS-CoV-2.  Clinical  correlation with patient history and other diagnostic information is  necessary to determine patient infection status.  Positive results do  not rule out bacterial infection or co-infection with other viruses. If result is PRESUMPTIVE POSTIVE SARS-CoV-2 nucleic acids MAY BE PRESENT.   A presumptive positive result was obtained on the submitted specimen  and confirmed on repeat testing.  While 2019 novel coronavirus  (SARS-CoV-2) nucleic acids may be present in the submitted sample  additional confirmatory testing may be necessary for epidemiological  and / or clinical management purposes  to differentiate between  SARS-CoV-2 and other Sarbecovirus currently known to infect humans.  If clinically indicated additional testing with an alternate test  methodology [phone]) is advised. The SARS-CoV-2 RNA is generally  detectable in upper and lower respiratory sp ecimens during the acute  phase of infection. The expected result is Negative. Fact Sheet for Patients:  StrictlyIdeas.no Fact Sheet for Healthcare Providers: BankingDealers.co.za This test is not yet approved or cleared by the  [place] FDA and has been authorized for detection and/or diagnosis of SARS-CoV-2 by FDA under an Emergency Use Authorization (EUA).  This EUA will remain in effect (meaning this test can be used) for the duration of the COVID-19 declaration under Section 564(b)(1) of the Act, 21 U.S.C. section 360bbb-3(b)(1), unless the authorization is terminated or revoked sooner. Performed at [facility], [address], [place], [place] [address]   MRSA PCR Screening     Status: None   Collection Time: 10/31/2018 10:31 PM   Specimen: Nasal Mucosa; Nasopharyngeal  Result Value Ref Range Status   MRSA by PCR NEGATIVE NEGATIVE Final    Comment:        The GeneXpert MRSA Assay (FDA approved for NASAL specimens only), is one component of a comprehensive MRSA colonization surveillance program. It is not intended to diagnose  MRSA infection nor to guide or monitor treatment for MRSA infections. Performed at [facility], [address], [place], [place] [address]      Scheduled Meds: . baclofen  20 mg Oral TID  . bisacodyl  10 mg Rectal Daily  . feeding supplement (ENSURE ENLIVE)  237 mL Oral BID BM  . ferrous sulfate  325 mg Oral Q48H  . fludrocortisone  0.1 mg Oral Daily  . fluticasone  1 spray Each Nare Daily  . guaiFENesin  600 mg Oral BID  . ipratropium-albuterol  3 mL Nebulization Q6H  . levothyroxine  150 mcg Oral Q0600  . loratadine  10 mg Oral Daily  . multivitamin with minerals  1 tablet Oral Daily  . nutrition supplement (JUVEN)  1 packet Oral BID BM  . pantoprazole  40 mg Oral Daily  . polyethylene glycol  17 g Oral Daily  . rivaroxaban  20 mg Oral Q supper  . sertraline  50 mg Oral Daily  . sodium chloride flush  3 mL Intravenous Q12H   Continuous Infusions: . sodium chloride    . sodium chloride 75 mL/hr at 11/22/2018 2159  . piperacillin-tazobactam (ZOSYN)  IV 3.375 g (10/26/18 0755)  . vancomycin      Procedures/Studies: Dg Chest Port 1 View  Result Date: 11/11/2018  CLINICAL DATA:  Shortness of breath, diagnosed with pneumonia 1 day prior EXAM: PORTABLE CHEST 1 VIEW COMPARISON:  Radiograph Aug 09, 2018 FINDINGS: Bilateral airspace disease most pronounced at the left lung base with associated volume loss. No pneumothorax. No effusion. Cardiomediastinal contours are unremarkable portable technique and hypoventilatory changes. Degenerative changes are present in the spine. Cervicothoracic fusion hardware is partially included in the imaging without visible complication. No acute osseous or soft tissue abnormality. IMPRESSION: Bilateral airspace disease compatible with multifocal pneumonia. Electronically Signed   By: Lovena Le M.D.   On: 11/12/2018 17:35    Orson Eva, DO  [facility] Pager [phone]  If 7PM-7AM, please contact night-coverage [email or web address] Password [email or web address] 10/26/2018, 11:41 AM   LOS: 1 day

## 2018-10-27 ENCOUNTER — Encounter (HOSPITAL_COMMUNITY): Payer: Self-pay | Admitting: Primary Care

## 2018-10-27 DIAGNOSIS — L8990 Pressure ulcer of unspecified site, unspecified stage: Secondary | ICD-10-CM | POA: Insufficient documentation

## 2018-10-27 DIAGNOSIS — R740 Nonspecific elevation of levels of transaminase and lactic acid dehydrogenase [LDH]: Secondary | ICD-10-CM

## 2018-10-27 DIAGNOSIS — L89304 Pressure ulcer of unspecified buttock, stage 4: Secondary | ICD-10-CM

## 2018-10-27 LAB — COMPREHENSIVE METABOLIC PANEL
ALT: 22 U/L (ref 0–44)
AST: 54 U/L — ABNORMAL HIGH (ref 15–41)
Albumin: 1.9 g/dL — ABNORMAL LOW (ref 3.5–5.0)
Alkaline Phosphatase: 122 U/L (ref 38–126)
Anion gap: 11 (ref 5–15)
BUN: 24 mg/dL — ABNORMAL HIGH (ref 6–20)
CO2: 24 mmol/L (ref 22–32)
Calcium: 7.4 mg/dL — ABNORMAL LOW (ref 8.9–10.3)
Chloride: 105 mmol/L (ref 98–111)
Creatinine, Ser: 1.02 mg/dL — ABNORMAL HIGH (ref 0.44–1.00)
GFR calc Af Amer: >60 mL/min (ref >60)
GFR calc non Af Amer: >60 mL/min (ref >60)
Glucose, Bld: 88 mg/dL (ref 70–99)
Potassium: 3.4 mmol/L — ABNORMAL LOW (ref 3.5–5.1)
Sodium: 140 mmol/L (ref 135–145)
Total Bilirubin: 1.2 mg/dL (ref 0.3–1.2)
Total Protein: 5.2 g/dL — ABNORMAL LOW (ref 6.5–8.1)

## 2018-10-27 LAB — CBC WITH DIFFERENTIAL/PLATELET
Abs Immature Granulocytes: 0.07 10*3/uL (ref 0.00–0.07)
Basophils Absolute: 0 10*3/uL (ref 0.0–0.1)
Basophils Relative: 0 %
Eosinophils Absolute: 0 10*3/uL (ref 0.0–0.5)
Eosinophils Relative: 0 %
HCT: 24.4 % — ABNORMAL LOW (ref 36.0–46.0)
Hemoglobin: 7.2 g/dL — ABNORMAL LOW (ref 12.0–15.0)
Immature Granulocytes: 1 %
Lymphocytes Relative: 4 %
Lymphs Abs: 0.5 10*3/uL — ABNORMAL LOW (ref 0.7–4.0)
MCH: 28.8 pg (ref 26.0–34.0)
MCHC: 29.5 g/dL — ABNORMAL LOW (ref 30.0–36.0)
MCV: 97.6 fL (ref 80.0–100.0)
Monocytes Absolute: 0.2 10*3/uL (ref 0.1–1.0)
Monocytes Relative: 2 %
Neutro Abs: 10.4 10*3/uL — ABNORMAL HIGH (ref 1.7–7.7)
Neutrophils Relative %: 93 %
Platelets: 188 10*3/uL (ref 150–400)
RBC: 2.5 MIL/uL — ABNORMAL LOW (ref 3.87–5.11)
RDW: 19.1 % — ABNORMAL HIGH (ref 11.5–15.5)
WBC: 11.2 10*3/uL — ABNORMAL HIGH (ref 4.0–10.5)
nRBC: 0 % (ref 0.0–0.2)

## 2018-10-27 LAB — PROCALCITONIN: Procalcitonin: 6.26 ng/mL

## 2018-10-27 MED ORDER — FLUDROCORTISONE ACETATE 0.1 MG PO TABS
0.1000 mg | ORAL_TABLET | Freq: Two times a day (BID) | ORAL | Status: DC
  Administered 2018-10-27: 0.1 mg via ORAL
  Filled 2018-10-27 (×10): qty 1

## 2018-10-27 MED ORDER — FLUDROCORTISONE ACETATE 0.1 MG PO TABS
ORAL_TABLET | ORAL | Status: AC
  Filled 2018-10-27: qty 1

## 2018-10-27 MED ORDER — IPRATROPIUM BROMIDE 0.02 % IN SOLN
0.5000 mg | Freq: Three times a day (TID) | RESPIRATORY_TRACT | Status: DC
  Administered 2018-10-27 – 2018-10-28 (×6): 0.5 mg via RESPIRATORY_TRACT
  Filled 2018-10-27 (×6): qty 2.5

## 2018-10-27 MED ORDER — LEVALBUTEROL HCL 0.63 MG/3ML IN NEBU
0.6300 mg | INHALATION_SOLUTION | Freq: Four times a day (QID) | RESPIRATORY_TRACT | Status: DC | PRN
  Administered 2018-10-28 – 2018-11-02 (×2): 0.63 mg via RESPIRATORY_TRACT
  Filled 2018-10-27 (×2): qty 3

## 2018-10-27 MED ORDER — LEVALBUTEROL HCL 1.25 MG/0.5ML IN NEBU
1.2500 mg | INHALATION_SOLUTION | Freq: Three times a day (TID) | RESPIRATORY_TRACT | Status: DC
  Administered 2018-10-27 – 2018-10-28 (×6): 1.25 mg via RESPIRATORY_TRACT
  Filled 2018-10-27 (×6): qty 0.5

## 2018-10-27 NOTE — Care Management Important Message (Signed)
Important Message  Patient Details  Name: LIBERTI APPLETON MRN: [id number] Date of Birth: 1965-12-31   Medicare Important Message Given:  Yes     Tommy Medal 10/27/2018, 2:39 PM

## 2018-10-27 NOTE — Plan of Care (Signed)
  Problem: Education: Goal: Knowledge of General Education information will improve Description Including pain rating scale, medication(s)/side effects and non-pharmacologic comfort measures Outcome: Progressing   Problem: Health Behavior/Discharge Planning: Goal: Ability to manage health-related needs will improve Outcome: Progressing   

## 2018-10-27 NOTE — Consult Note (Signed)
Consultation Note Date: 10/27/2018   Patient Name: Sarah [name]  DOB: [birth year]  MRN: [id number]  Age / Sex: 53 y.o., female  PCP: Daisy Floro, DO Referring Physician: Barton Dubois, MD  Reason for Consultation: Establishing goals of care and Psychosocial/spiritual support  HPI/Patient Profile: 53 y.o. 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   Clinical Assessment and Goals of Care: [name] is sitting up in the bed in a dark room.  She greets me making but not keeping eye contact.  She appears chronically ill and frail.  There is no family at bedside at this time.  She denies issues or concerns at this time.  We briefly talked about healthcare power of attorney, but [name] tells me that she "has this pneumonia", and does not feel like talking right now.  We plan for a follow-up tomorrow.  Conference with bedside nursing staff and attending related to patient condition, needs.   HCPOA    NEXT OF KIN  -[name] is unable to name a healthcare surrogate today.  The law states that her mother and siblings would be surrogate decision makers.  SUMMARY OF RECOMMENDATIONS   FULL scope/Full code Continue code status discussions  Return to [facility]  Code Status/Advance Care Planning:  Full code  Symptom Management:   Per hospitalist, no additional needs at this time  Palliative Prophylaxis:   Palliative Wound Care  Additional Recommendations (Limitations, Scope, Preferences):  Full Scope Treatment  Psycho-social/Spiritual:   Desire for further Chaplaincy support:no  Additional Recommendations:  Caregiving  Support/Resources and Education on Hospice  Prognosis:   Unable to determine, 6 months or less would not be surprising based on poor functional status, multiple hospitalizations and ED visits in the last 6 months, cancer burden.  Discharge Planning: Return to residential SNF      Primary Diagnoses: Present on Admission: . Bilateral/multifocal PNA (pneumonia) . 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 Paraplegia at T4 level ([place]) . Neurogenic bowel . Spinal cord compression due to malignant neoplasm metastatic to spine ([place]) . Metastatic cancer to spine ([place]) . Myelopathy ([place]) . Transaminitis   I have reviewed the medical record, interviewed the patient and family, and examined the patient. The following aspects are pertinent.  Past Medical History:  Diagnosis Date  . Anxiety   . Cancer [facility])    FNA of thyroid positive for onconytic/hurthle cell carcinoma  . Chronic back pain   . Complication of anesthesia    Nausea and vomiting  . DDD (degenerative disc disease), cervical   . Depression   . DJD (degenerative joint disease)   . Family history of adverse reaction to anesthesia    MOTHER HAS NAUSEA   . GERD (gastroesophageal reflux disease)   . History of blood transfusion  08/2017  . History of rectal fissure   . HIT (heparin-induced thrombocytopenia) ([place])   . Hypertension    05/27/2018- not current- runs low  . Hypothyroidism   . Obesity   . Paraplegia at T4 level ([place])   . Pneumonia 03/2018  . Pulmonary embolus, right ([place]) 2015  . Rotator cuff tendonitis right   Social History   Socioeconomic History  . Marital status: Single    Spouse name: Not on file  . Number of children: Not on file  . Years of education: Not on file  . Highest education level: Not on file  Occupational History  . Not on file  Social Needs  . Financial resource strain: Not on file  . Food insecurity    Worry: Not on file    Inability: Not on file  . Transportation  needs    Medical: Not on file    Non-medical: Not on file  Tobacco Use  . Smoking status: Never Smoker  . Smokeless tobacco: Never Used  Substance and Sexual Activity  . Alcohol use: No  . Drug use: No  . Sexual activity: Not Currently  Lifestyle  . Physical activity    Days per week: Not on file    Minutes per session: Not on file  . Stress: Not on file  Relationships  . Social Herbalist on phone: Patient refused    Gets together: Patient refused    Attends religious service: Patient refused    Active member of club or organization: Patient refused    Attends meetings of clubs or organizations: Patient refused    Relationship status: Patient refused  Other Topics Concern  . Not on file  Social History Narrative  . Not on file   Family History  Problem Relation Age of Onset  . Hypertension Mother   . Diabetes Mother   . Hypertension Father   . Stroke Father   . Diabetes Father   . Diabetes Sister   . Diabetes Sister    Scheduled Meds: . baclofen  20 mg Oral TID  . bisacodyl  10 mg Rectal Daily  . feeding supplement (ENSURE ENLIVE)  237 mL Oral BID BM  . ferrous sulfate  325 mg Oral Q48H  . fludrocortisone  0.1 mg Oral Daily  . fluticasone  1 spray Each Nare Daily  . guaiFENesin  600 mg Oral BID  . ipratropium  0.5 mg Nebulization TID  . levalbuterol  1.25 mg Nebulization TID  . levothyroxine  150 mcg Oral Q0600  . loratadine  10 mg Oral Daily  . multivitamin with minerals  1 tablet Oral Daily  . nutrition supplement (JUVEN)  1 packet Oral BID BM  . pantoprazole  40 mg Oral Daily  . polyethylene glycol  17 g Oral Daily  . rivaroxaban  20 mg Oral Q supper  . sertraline  50 mg Oral Daily  . sodium chloride flush  3 mL Intravenous Q12H   Continuous Infusions: . sodium chloride    . sodium chloride Stopped (10/27/18 0027)  . piperacillin-tazobactam (ZOSYN)  IV 3.375 g (10/27/18 1006)   PRN Meds:.sodium chloride, acetaminophen **OR** acetaminophen,  levalbuterol, ondansetron **OR** ondansetron (ZOFRAN) IV, ondansetron, oxyCODONE, polyethylene glycol, sodium chloride flush, traZODone Medications Prior to Admission:  Prior to Admission medications   Medication Sig Start Date End Date Taking? Authorizing Provider  acetaminophen (TYLENOL) 650 MG CR tablet Take 1,300 mg by mouth every 6 (six) hours as needed for pain.  Yes [provider]  Amino Acids-Protein Hydrolys (PRO-STAT PO) Take 30 mLs by mouth 2 (two) times daily.   Yes [provider]  baclofen (LIORESAL) 20 MG tablet Take 1 tablet (20 mg total) by mouth 3 (three) times daily. 09/02/18  Yes Angiulli, Lavon Paganini, PA-C  bisacodyl (DULCOLAX) 10 MG suppository Place 1 suppository (10 mg total) rectally daily. Patient taking differently: Place 10 mg rectally daily as needed for mild constipation or moderate constipation.  09/02/18  Yes Angiulli, Lavon Paganini, PA-C  diclofenac sodium (VOLTAREN) 1 % GEL Apply 2 g topically 3 (three) times daily. Patient taking differently: Apply 2 g topically 2 (two) times daily. Left shoulder 09/02/18  Yes Angiulli, Lavon Paganini, PA-C  ferrous sulfate 325 (65 FE) MG tablet Take 1 tablet (325 mg total) by mouth every other day. 08/17/18  Yes [name], MD  fludrocortisone (FLORINEF) 0.1 MG tablet Take 1 tablet (0.1 mg total) by mouth daily. 09/03/18  Yes Angiulli, Lavon Paganini, PA-C  fluticasone (FLONASE) 50 MCG/ACT nasal spray Place 1 spray into both nostrils daily.   Yes [provider]  guaifenesin (HUMIBID E) 400 MG TABS tablet Take 400 mg by mouth 3 (three) times daily. 14 day course starting on 10/24/2018   Yes [provider]  ipratropium-albuterol (DUONEB) 0.5-2.5 (3) MG/3ML SOLN Take 3 mLs by nebulization every 8 (eight) hours. *May inhale every 6 hours as needed for shortness of breath/wheezing   Yes [provider]  levothyroxine (SYNTHROID) 150 MCG tablet Take 1 tablet (150 mcg total) by mouth daily before breakfast.  07/29/18 10/23/2018 Yes [name], MD  loratadine (CLARITIN) 10 MG tablet Take 10 mg by mouth daily.   Yes [provider]  Multiple Vitamin (MULTIVITAMIN WITH MINERALS) TABS tablet Take 1 tablet by mouth daily. 02/24/18  Yes Johnson, Clanford L, MD  ondansetron (ZOFRAN) 4 MG tablet Take 4 mg by mouth every 6 (six) hours as needed for nausea or vomiting.   Yes [provider]  oxyCODONE (ROXICODONE) 5 MG immediate release tablet Take 1 tablet (5 mg total) by mouth every 6 (six) hours as needed for severe pain. 09/02/18 09/02/19 Yes Angiulli, Lavon Paganini, PA-C  pantoprazole (PROTONIX) 40 MG tablet Take 1 tablet (40 mg total) by mouth daily. Patient taking differently: Take 40 mg by mouth daily.  08/09/18  Yes [name], MD  polyethylene glycol (MIRALAX / GLYCOLAX) 17 g packet Take 17 g by mouth daily. 09/03/18  Yes Angiulli, Lavon Paganini, PA-C  rivaroxaban (XARELTO) 20 MG TABS tablet Take 1 tablet (20 mg total) by mouth daily with supper. Patient taking differently: Take 20 mg by mouth every morning.  07/19/18  Yes Diallo, Abdoulaye, MD  sertraline (ZOLOFT) 50 MG tablet Take 50 mg by mouth daily.  12/24/17  Yes [provider]   Allergies  Allergen Reactions  . Bee Venom Anaphylaxis  . Heparin Other (See Comments)    Weak positive platelets induced antibodies, SRA negative--2015  . Keflex [Cephalexin] Nausea And Vomiting  . Tramadol Nausea And Vomiting  . Gabapentin Nausea And Vomiting  . Hydrochlorothiazide Nausea And Vomiting  . Hydrocodone Nausea And Vomiting  . Oxycodone-Acetaminophen Nausea And Vomiting  . Sorbitol Nausea Only    And abdominal distress  . Tizanidine     Fatigue    Review of Systems  Unable to perform ROS: Other    Physical Exam Vitals signs and nursing note reviewed.  Constitutional:      General: She is  not in acute distress. Cardiovascular:     Rate and Rhythm: Normal rate.  Pulmonary:     Effort: Pulmonary effort is normal. No  respiratory distress.  Skin:    General: Skin is warm and dry.     Comments: Wounds as noted by nursing   Neurological:     Mental Status: She is oriented to person, place, and time.  Psychiatric:        Mood and Affect: Mood normal.        Behavior: Behavior normal.     Vital Signs: BP 94/60 (BP Location: Right Arm)   Pulse 99   Temp 98 F (36.7 C) (Oral)   Resp 20   Ht 5\' 11"  (1.803 m)   Wt 87.1 kg   LMP 10/22/2016 (LMP Unknown)   SpO2 99%   BMI 26.78 kg/m  Pain Scale: 0-10   Pain Score: 0-No pain   SpO2: SpO2: 99 % O2 Device:SpO2: 99 % O2 Flow Rate: .O2 Flow Rate (L/min): 3 L/min  IO: Intake/output summary:   Intake/Output Summary (Last 24 hours) at 10/27/2018 1152 Last data filed at 10/27/2018 0845 Gross per 24 hour  Intake 2271.01 ml  Output 450 ml  Net 1821.01 ml    LBM: Last BM Date: 10/26/18 Baseline Weight: Weight: 87.1 kg Most recent weight: Weight: 87.1 kg     Palliative Assessment/Data:   Flowsheet Rows     Most Recent Value  Intake Tab  Referral Department  Hospitalist  Unit at Time of Referral  Cardiac/Telemetry Unit  Palliative Care Primary Diagnosis  Cancer  Date Notified  10/26/18  Palliative Care Type  Return patient Palliative Care  Reason for referral  Clarify Goals of Care  Date of Admission  10/23/2018  Date first seen by Palliative Care  10/27/18  # of days Palliative referral response time  1 Day(s)  # of days IP prior to Palliative referral  1  Clinical Assessment  Palliative Performance Scale Score  30%  Pain Max last 24 hours  Not able to report  Pain Min Last 24 hours  Not able to report  Dyspnea Max Last 24 Hours  Not able to report  Dyspnea Min Last 24 hours  Not able to report  Psychosocial & Spiritual Assessment  Palliative Care Outcomes      Time In: 1410 Time Out: 1440 Time Total: 30 minutes  Greater than 50%  of this time was spent counseling and coordinating care related to the above assessment and plan.   Signed by: Drue Novel, NP   Please contact Palliative Medicine Team phone at [phone] for questions and concerns.  For individual provider: See Shea Evans

## 2018-10-27 NOTE — Progress Notes (Signed)
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 paraplegia and sacral decubitus ulcer presenting with one week of sob from [place] that worsened over 24 hours prior to admission.  Apparently, the patient had been diagnosed with pneumonia on 05/01/3660 at [place], but had not yet been started on antibiotics.  At baseline she is nonambulatory and transfers with sliding board.  The patient has had numerous recent hospitalizations including from 08/09/2018 to 08/17/2018 during which time she was admitted for an infected sacral decubitus ulcer.  She had a debridement on 08/10/2018 by general surgery and was discharged to finish a short course of clindamycin.  CT of the pelvis was negative for osteomyelitis at that time.  She subsequently stayed at [facility] from 08/16/2020 of 09/03/2018 after which she was discharged to [facility].  The patient had complained of some subjective fever and chills, but denied any vomiting, diarrhea, abdominal pain, headache, neck pain, hemoptysis.  In the emergency department, the patient was afebrile with soft blood pressures in the 90s.  Oxygen saturation was initially 87% on 2 L, and improved to 94% on 4 L.  Chest x-ray revealed increasing bibasilar opacities concerning for pneumonia.  As result, the patient was started on vancomycin and cefepime and admitted for further treatment and evaluation.  Assessment/Plan: Acute respiratory failure with hypoxia -Secondary to pneumonia -Presently stable on 3 L -Wean oxygen for saturation greater than 92% -SARS-CoV2 neg  HCAP -MRSA PCR neg -d/c vanco -continue zosyn -PCT 6.09 -WBC increased in part due to patient receiving IV  steroids on 11/20/2018  E. coli and Morganella UTI -Sensitivity pending -Continue Zosyn -Patient overall responding from a infectious profile with current antibiotics. -She is chronically dependent to indwelling catheter secondary to neurogenic bladder and could be possible the findings represents colonization.  Hypotension -This has been a chronic issue for the patient with labile blood pressures -Likely due to the patient's dysautonomia from her spinal cord metastasis/paraplegia -Continue Florinef, dose adjusted to BID. -follow blood cultures and urine culture  myelopathy and paraplegia -secondary to metastatic Hurthle cancerto the thoracic spine status post recurrence of T3/T4 spinal tumor with resection June 2019 -Continue baclofen -Patient has neurogenic bladder and bowel  Stage IV sacral decubitus ulcer -Personally examined wound-->100% granulation without signs of superimposed infection. -Present on admission -continue daily wet-to-dry dressings  Transaminasemia -likely due to hepatic congestion from infx -RUQ US demonstrating hepatic steatosis no focal liver lesions are evident on this study, but trazodone sensitivity is diminished for the circumstances.  Hurthle Cell Carcinoma -continue outpatient follow up with [name]  Hypothyroidism -Continue Synthroid  History of pulmonary embolus -Continue rivaroxaban -No signs of overt bleeding.  Depression -Continue Zoloft  Anemia of chronic disease -Baseline hemoglobin~8 -Continue iron supplementation    Disposition Plan:   SNF in 1-2 days;  Family Communication:   No Family at bedside  Consultants:  Palliative Care  Code Status:  FULL   DVT Prophylaxis:  Xarelto   Procedures: As Listed in Progress Note Above  Antibiotics: Zosyn 8/3>>> vanco 8/3>>8/4   Subjective: No fever, no headaches, no chest pain, no nausea, no vomiting, no abdominal pain.  Continue reporting shortness of  breath, some difficulty  swallowing and feeling congested.  Objective: Vitals:   10/27/18 0558 10/27/18 0742 10/27/18 1306 10/27/18 1332  BP: 94/60  110/73   Pulse:   85   Resp: 20  18   Temp: 98 F (36.7 C)  97.8 F (36.6 C)   TempSrc: Oral  Oral   SpO2:  99% 98% 98%  Weight:      Height:        Intake/Output Summary (Last 24 hours) at 10/27/2018 1823 Last data filed at 10/27/2018 1743 Gross per 24 hour  Intake 2631.01 ml  Output 800 ml  Net 1831.01 ml   Weight change:  Exam: General exam: Alert, awake, oriented x 3; patient is afebrile, still reporting feeling short of breath, feeling congested and having some difficulty swallowing. Respiratory system: Diffuse rhonchi, no using accessory muscles, requiring 3 L nasal cannula supplementation. Cardiovascular system:RRR. No murmurs, rubs, gallops.  No JVD Gastrointestinal system: Abdomen is nondistended, soft and nontender. No organomegaly or masses felt. Normal bowel sounds heard. Central nervous system: Alert and oriented. No focal neurological deficits. Extremities: No cyanosis or clubbing.  Trace to 1+ edema bilaterally. Skin: No petechiae.  Stage IV bilateral ischial ulcers without signs of superimposed infection or active drainage.  Positive granulation tissue appreciated.  Ulcers were present prior to admission. Psychiatry: Judgement and insight appear normal. Mood & affect appropriate.    Data Reviewed: I have personally reviewed following labs and imaging studies  Basic Metabolic Panel: Recent Labs  Lab 11/20/2018 1656 10/26/18 0607 10/27/18 0546  NA 141 141 140  K 3.7 4.1 3.4*  CL 106 104 105  CO2 23 19* 24  GLUCOSE 73 135* 88  BUN 18 22* 24*  CREATININE 0.86 1.05* 1.02*  CALCIUM 7.6* 7.7* 7.4*   Liver Function Tests: Recent Labs  Lab 11/16/2018 1656 10/26/18 0607 10/27/18 0546  AST 130* 92* 54*  ALT 31 27 22   ALKPHOS 178* 156* 122  BILITOT 1.0 1.9* 1.2  PROT 6.0* 5.7* 5.2*  ALBUMIN 2.3* 2.2* 1.9*   Coagulation Profile:  Recent Labs  Lab 11/07/2018 1656  INR 2.5*   CBC: Recent Labs  Lab 11/02/2018 1656 10/26/18 0607 10/27/18 0546  WBC 6.9 12.7* 11.2*  NEUTROABS 6.0  --  10.4*  HGB 8.3* 8.0* 7.2*  HCT 28.5* 27.4* 24.4*  MCV 100.0 99.6 97.6  PLT 199 201 188   Urine analysis:    Component Value Date/Time   COLORURINE YELLOW 11/05/2018 1826   APPEARANCEUR HAZY (A) 10/26/2018 1826   LABSPEC 1.013 11/11/2018 1826   PHURINE 6.0 11/06/2018 1826   GLUCOSEU NEGATIVE 11/07/2018 1826   HGBUR NEGATIVE 11/05/2018 1826   BILIRUBINUR NEGATIVE 11/18/2018 1826   BILIRUBINUR MODERATE 03/03/2014 1028   KETONESUR 20 (A) 10/23/2018 1826   PROTEINUR NEGATIVE 11/17/2018 1826   UROBILINOGEN 1.0 03/11/2014 2130   NITRITE POSITIVE (A) 11/06/2018 1826   LEUKOCYTESUR MODERATE (A) 10/28/2018 1826    Recent Results (from the past 240 hour(s))  Blood Culture (routine x 2)     Status: None (Preliminary result)   Collection Time: 11/03/2018  4:56 PM   Specimen: Right Antecubital; Blood  Result Value Ref Range Status   Specimen Description   Final    RIGHT ANTECUBITAL BOTTLES DRAWN AEROBIC AND ANAEROBIC   Special Requests   Final    Blood Culture results may not be optimal due to an excessive volume of blood received in culture bottles   Culture   Final    NO  GROWTH 2 DAYS Performed at [facility], [address] [place], [place] [address]    Report Status PENDING  Incomplete  Blood Culture (routine x 2)     Status: None (Preliminary result)   Collection Time: 11/03/2018  5:06 PM   Specimen: Left Antecubital; Blood  Result Value Ref Range Status   Specimen Description   Final    LEFT ANTECUBITAL BOTTLES DRAWN AEROBIC AND ANAEROBIC   Special Requests Blood Culture adequate volume  Final   Culture   Final    NO GROWTH 2 DAYS Performed at [facility], [address] [place], [place] [address]    Report Status PENDING  Incomplete  SARS Coronavirus 2 [facility] order, Performed in [place] hospital lab)  Nasopharyngeal Nasopharyngeal Swab     Status: None   Collection Time: 11/18/2018  5:36 PM   Specimen: Nasopharyngeal Swab  Result Value Ref Range Status   SARS Coronavirus 2 NEGATIVE NEGATIVE Final    Comment: (NOTE) If result is NEGATIVE SARS-CoV-2 target nucleic acids are NOT DETECTED. The SARS-CoV-2 RNA is generally detectable in upper and lower  respiratory specimens during the acute phase of infection. The lowest  concentration of SARS-CoV-2 viral copies this assay can detect is 250  copies / mL. A negative result does not preclude SARS-CoV-2 infection  and should not be used as the sole basis for treatment or other  patient management decisions.  A negative result may occur with  improper specimen collection / handling, submission of specimen other  than nasopharyngeal swab, presence of viral mutation(s) within the  areas targeted by this assay, and inadequate number of viral copies  (<250 copies / mL). A negative result must be combined with clinical  observations, patient history, and epidemiological information. If result is POSITIVE SARS-CoV-2 target nucleic acids are DETECTED. The SARS-CoV-2 RNA is generally detectable in upper and lower  respiratory specimens dur ing the acute phase of infection.  Positive  results are indicative of active infection with SARS-CoV-2.  Clinical  correlation with patient history and other diagnostic information is  necessary to determine patient infection status.  Positive results do  not rule out bacterial infection or co-infection with other viruses. If result is PRESUMPTIVE POSTIVE SARS-CoV-2 nucleic acids MAY BE PRESENT.   A presumptive positive result was obtained on the submitted specimen  and confirmed on repeat testing.  While 2019 novel coronavirus  (SARS-CoV-2) nucleic acids may be present in the submitted sample  additional confirmatory testing may be necessary for epidemiological  and / or clinical management purposes  to  differentiate between  SARS-CoV-2 and other Sarbecovirus currently known to infect humans.  If clinically indicated additional testing with an alternate test  methodology [phone]) is advised. The SARS-CoV-2 RNA is generally  detectable in upper and lower respiratory sp ecimens during the acute  phase of infection. The expected result is Negative. Fact Sheet for Patients:  StrictlyIdeas.no Fact Sheet for Healthcare Providers: BankingDealers.co.za This test is not yet approved or cleared by the [place] FDA and has been authorized for detection and/or diagnosis of SARS-CoV-2 by FDA under an Emergency Use Authorization (EUA).  This EUA will remain in effect (meaning this test can be used) for the duration of the COVID-19 declaration under Section 564(b)(1) of the Act, 21 U.S.C. section 360bbb-3(b)(1), unless the authorization is terminated or revoked sooner. Performed at [facility], [address], [place], [place] [address]   Urine culture     Status: Abnormal (Preliminary result)  Collection Time: 10/31/2018  6:26 PM   Specimen: In/Out Cath Urine  Result Value Ref Range Status   Specimen Description   Final    IN/OUT CATH URINE Performed at [facility], [address] [place], [place] [address]    Special Requests   Final    NONE Performed at [facility], [address], [place], [place] [address]    Culture (A)  Final    >=100,000 COLONIES/mL MORGANELLA MORGANII >=100,000 COLONIES/mL ESCHERICHIA COLI SUSCEPTIBILITIES TO FOLLOW Performed at [facility] [address], [place], [place] [address]    Report Status PENDING  Incomplete  MRSA PCR Screening     Status: None   Collection Time: 11/02/2018 10:31 PM   Specimen: Nasal Mucosa; Nasopharyngeal  Result Value Ref Range Status   MRSA by PCR NEGATIVE NEGATIVE Final    Comment:        The GeneXpert MRSA Assay (FDA approved for NASAL specimens only), is one component  of a comprehensive MRSA colonization surveillance program. It is not intended to diagnose MRSA infection nor to guide or monitor treatment for MRSA infections. Performed at [facility], [address] [place], [place] [address]      Scheduled Meds: . baclofen  20 mg Oral TID  . bisacodyl  10 mg Rectal Daily  . feeding supplement (ENSURE ENLIVE)  237 mL Oral BID BM  . ferrous sulfate  325 mg Oral Q48H  . fludrocortisone  0.1 mg Oral BID  . fluticasone  1 spray Each Nare Daily  . guaiFENesin  600 mg Oral BID  . ipratropium  0.5 mg Nebulization TID  . levalbuterol  1.25 mg Nebulization TID  . levothyroxine  150 mcg Oral Q0600  . loratadine  10 mg Oral Daily  . multivitamin with minerals  1 tablet Oral Daily  . nutrition supplement (JUVEN)  1 packet Oral BID BM  . pantoprazole  40 mg Oral Daily  . polyethylene glycol  17 g Oral Daily  . rivaroxaban  20 mg Oral Q supper  . sertraline  50 mg Oral Daily  . sodium chloride flush  3 mL Intravenous Q12H   Continuous Infusions: . sodium chloride    . sodium chloride 75 mL/hr at 10/27/18 1645  . piperacillin-tazobactam (ZOSYN)  IV 3.375 g (10/27/18 1646)    Procedures/Studies: Dg Chest Port 1 View  Result Date: 10/27/2018 CLINICAL DATA:  Shortness of breath, diagnosed with pneumonia 1 day prior EXAM: PORTABLE CHEST 1 VIEW COMPARISON:  Radiograph Aug 09, 2018 FINDINGS: Bilateral airspace disease most pronounced at the left lung base with associated volume loss. No pneumothorax. No effusion. Cardiomediastinal contours are unremarkable portable technique and hypoventilatory changes. Degenerative changes are present in the spine. Cervicothoracic fusion hardware is partially included in the imaging without visible complication. No acute osseous or soft tissue abnormality. IMPRESSION: Bilateral airspace disease compatible with multifocal pneumonia. Electronically Signed   By: Lovena Le M.D.   On: 11/10/2018 17:35   US Abdomen Limited Ruq   Result Date: 10/26/2018 CLINICAL DATA:  Elevated liver enzymes EXAM: ULTRASOUND ABDOMEN LIMITED RIGHT UPPER QUADRANT COMPARISON:  None. FINDINGS: Gallbladder: Surgically absent. Common bile duct: Diameter: 6 mm. No intrahepatic or extrahepatic biliary duct dilatation. Liver: No focal lesion identified. Liver echogenicity is increased diffusely. Portal vein is patent on color Doppler imaging with normal direction of blood flow towards the liver. Other: Small right pleural effusion noted. IMPRESSION: 1. Increase in liver echogenicity, a finding indicative of hepatic steatosis. While no  focal liver lesions are evident on this study, it must be cautioned that the sensitivity of ultrasound for detection of focal liver lesions is diminished in this circumstance. 2.  Gallbladder absent. 3.  Small right pleural effusion. Electronically Signed   By: Lowella Grip III M.D.   On: 10/26/2018 14:35    Barton Dubois, MD  [facility] Pager [phone]  10/27/2018, 6:23 PM   LOS: 2 days

## 2018-10-27 NOTE — TOC Initial Note (Signed)
Transition of Care [facility]) - Initial/Assessment Note    Patient Details  Name: Sarah [name] MRN: [id number] Date of Birth: 1965-11-01  Transition of Care [facility]) CM/SW Contact:    Wallace Gappa, Chauncey Reading, RN Phone Number: 10/27/2018, 10:42 AM  Clinical Narrative:       [name] from [facility] for short term rehab. Per Clarene Critchley at [facility] is for patient to return to [place] when discharged from hospital.             Expected Discharge Plan: [place] Barriers to Discharge: No Barriers Identified    Expected Discharge Plan and Services Expected Discharge Plan: [place]           Activities of Daily Living Home Assistive Devices/Equipment: Eyeglasses, Grab bars around toilet, Grab bars in shower, Shower chair without back, Wheelchair ADL Screening (condition at time of admission) Patient's cognitive ability adequate to safely complete daily activities?: Yes Is the patient deaf or have difficulty hearing?: No Does the patient have difficulty seeing, even when wearing glasses/contacts?: No Does the patient have difficulty concentrating, remembering, or making decisions?: No Patient able to express need for assistance with ADLs?: Yes Does the patient have difficulty dressing or bathing?: Yes Independently performs ADLs?: No Communication: Appropriate for developmental age Dressing (OT): Needs assistance Is this a change from baseline?: Pre-admission baseline Grooming: Needs assistance Is this a change from baseline?: Pre-admission baseline Feeding: Independent Bathing: Needs assistance Is this a change from baseline?: Pre-admission baseline Toileting: Needs assistance Is this a change from baseline?: Pre-admission baseline In/Out Bed: Needs assistance Is this a change from baseline?: Pre-admission baseline Walks in Home: Dependent Is this a change from baseline?: Pre-admission baseline Does the patient have difficulty walking or climbing  stairs?: Yes Weakness of Legs: Both Weakness of Arms/Hands: Both    Admission diagnosis:  Healthcare-associated pneumonia [J18.9] Patient Active Problem List   Diagnosis Date Noted  . Lobar pneumonia ([place]) 10/26/2018  . Acute respiratory failure with hypoxia ([place]) 10/26/2018  . Bilateral/multifocal PNA (pneumonia) 11/08/2018  . CKD (chronic kidney disease), stage III ([place])   . Hypoalbuminemia due to protein-calorie malnutrition ([place])   . Hyperglycemia   . Nasal congestion   . Hypotension   . Acute on chronic anemia   . Febrile illness   . Bacterial skin infection 08/10/2018  . Eustachian tube disorder, left 06/30/2018  . Urinary tract infection 06/30/2018  . Malignant neoplasm ([place]) 06/01/2018  . Metastasis to brain ([place]) 06/01/2018  . Transaminitis 04/09/2018  . Hypoalbuminemia 04/09/2018  . Dyspnea   . Multifocal pneumonia   . Palliative care by specialist   . Goals of care, counseling/discussion   . DNR (do not resuscitate) discussion   . Fecal impaction ([place]) 02/06/2018  . HCAP (healthcare-associated pneumonia) 02/06/2018  . Anemia   . Chronic anticoagulation   . Sacral ulcer ([place]) 02/04/2018  . Fracture   . Acute lower UTI   . Myelopathy ([place]) 09/21/2017  . Muscle spasms of both lower extremities   . Closed fracture of shaft of left femur ([place])   . Acute blood loss anemia   . Hypothyroidism   . Nausea & vomiting 09/15/2017  . Closed fracture of left distal femur ([place]) 09/11/2017  . Paraplegia ([place]) 09/08/2017  . Obesity (BMI 30-39.9) 09/03/2017  . Status post surgery 09/03/2017  . Calculus of gallbladder without cholecystitis without obstruction   . Biliary dyskinesia   . Intractable nausea and vomiting 08/09/2017  . Thrombocytopenia ([place]) 08/09/2017  . Elevated  bilirubin 08/09/2017  . AKI (acute kidney injury) ([place]) 08/09/2017  . Chest pain 01/26/2017  . Upper respiratory infection, viral 01/26/2017  . Counseling regarding advanced care planning and goals  of care 01/02/2017  . Bilateral rotator cuff syndrome 02/12/2016  . E. coli UTI 08/01/2015  . Constipation due to neurogenic bowel 08/01/2015  . 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 Spastic paraparesis   . Neurogenic bladder   . History of pulmonary embolism   . Post-operative pain   . Metastatic cancer ([place])   . Steroid-induced hyperglycemia   . Depression   . Obstipation   . Thoracic spine tumor 07/06/2015  . 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 Spinal cord compression due to malignant neoplasm metastatic to spine ([place])   . Postoperative hypothyroidism   . 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 Spasticity 09/19/2014  . Hypertension 05/01/2014  . Ingrown right big toenail 05/01/2014  . GERD (gastroesophageal reflux disease) 02/14/2014  . Dysphagia, pharyngoesophageal phase 02/06/2014  . Type 2 diabetes mellitus without complication ([place]) 84/16/6063  . Constipation   . Neurogenic bowel   . Paraplegia at T4 level ([place]) 01/20/2014  . Metastatic cancer to spine ([place]) 01/19/2014  . Rotator cuff tendonitis   . 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 Leg weakness, bilateral 12/13/2013   PCP:  Daisy Floro, DO Pharmacy:   [facility], [place] [address] Phone: [phone] Fax: [place], [place] [address] Phone: [phone] Fax: [phone]     Social Determinants of Health (SDOH) Interventions    Readmission Risk Interventions Readmission Risk Prevention Plan 10/27/2018 06/02/2018  Transportation Screening Complete Complete  Medication Review (RN Care Manager) Complete Complete  PCP or Specialist appointment within 3-5 days of discharge Complete Complete  HRI or Home Care Consult  Complete Complete  SW Recovery Care/Counseling Consult Complete -  Palliative Care Screening Not Applicable Not [place] Complete Not Applicable  Some recent data might be hidden

## 2018-10-28 DIAGNOSIS — J189 Pneumonia, unspecified organism: Secondary | ICD-10-CM

## 2018-10-28 LAB — URINE CULTURE: Culture: >=100000 — AB

## 2018-10-28 LAB — PROCALCITONIN: Procalcitonin: 4.7 ng/mL

## 2018-10-28 MED ORDER — LORAZEPAM 2 MG/ML IJ SOLN
1.0000 mg | Freq: Once | INTRAMUSCULAR | Status: AC
  Administered 2018-10-28: 1 mg via INTRAVENOUS
  Filled 2018-10-28: qty 1

## 2018-10-28 MED ORDER — SODIUM CHLORIDE 0.9 % IV SOLN
1.0000 g | Freq: Three times a day (TID) | INTRAVENOUS | Status: DC
  Administered 2018-10-28 – 2018-11-04 (×22): 1 g via INTRAVENOUS
  Filled 2018-10-28 (×24): qty 1

## 2018-10-28 MED ORDER — PHENOL 1.4 % MT LIQD
1.0000 | OROMUCOSAL | Status: DC | PRN
  Administered 2018-10-28: 04:00:00 1 via OROMUCOSAL
  Filled 2018-10-28: qty 177

## 2018-10-28 MED ORDER — METHYLPREDNISOLONE SODIUM SUCC 40 MG IJ SOLR
20.0000 mg | Freq: Two times a day (BID) | INTRAMUSCULAR | Status: DC
  Administered 2018-10-28 – 2018-10-29 (×2): 20 mg via INTRAVENOUS
  Filled 2018-10-28 (×2): qty 1

## 2018-10-28 NOTE — Progress Notes (Signed)
Pharmacy Antibiotic Note  Sarah [name] is a 53 y.o. female admitted on 11/14/2018 with HCAP/ESBL e. coli UTI/MDR moganella.  Pharmacy has been consulted for Merrem dosing.  Plan: Meropenem 1000 mg IV every 8 hours.  Monitor labs, c/s, and patient improvement.  Height: 5\' 11"  (180.3 cm) Weight: 192 lb 0.3 oz (87.1 kg) IBW/kg (Calculated) : 70.8  Temp (24hrs), Avg:97.8 F (36.6 C), Min:97.6 F (36.4 C), Max:97.9 F (36.6 C)  Recent Labs  Lab 10/31/2018 1656 10/26/18 0607 10/27/18 0546  WBC 6.9 12.7* 11.2*  CREATININE 0.86 1.05* 1.02*  LATICACIDVEN 0.6  --   --     Estimated Creatinine Clearance: 78.7 mL/min (A) (by C-G formula based on SCr of 1.02 mg/dL (H)).    Allergies  Allergen Reactions  . Bee Venom Anaphylaxis  . Heparin Other (See Comments)    Weak positive platelets induced antibodies, SRA negative--2015  . Keflex [Cephalexin] Nausea And Vomiting  . Tramadol Nausea And Vomiting  . Gabapentin Nausea And Vomiting  . Hydrochlorothiazide Nausea And Vomiting  . Hydrocodone Nausea And Vomiting  . Oxycodone-Acetaminophen Nausea And Vomiting  . Sorbitol Nausea Only    And abdominal distress  . Tizanidine     Fatigue     Antimicrobials this admission: Merrem 8/6 >>  Zosyn 8/3 >> 8/5 Vanco 8/3 >>8/5  Dose adjustments this admission: N/A  Microbiology results: 8/3 BCx: ngtd 8/3 UCx: >100K ESBL e.coli, MDR morganella  8/3 MRSA PCR: negative  Thank you for allowing pharmacy to be a part of this patient's care.  Ramond Craver 10/28/2018 7:53 AM

## 2018-10-28 NOTE — Progress Notes (Signed)
Patient called nurses station. Nurse went to patients room. Pt stated she cant breathe. Checked oxygen saturation. Oxygen saturation was 88-89 on 4 liters nasal cannula. Patient sounds congested in throat. Patient not able to get a deep breathe to cough. Paged MD to make aware. No new orders at this time. Placed patient oxygen level to 4.5. Will continue to monitor throughout shift.

## 2018-10-28 NOTE — Progress Notes (Signed)
MD placed order for ativan 1mg  IV one time dose. Will continue to monitor patient throughout shift.

## 2018-10-28 NOTE — Progress Notes (Signed)
Patient assessed for shortness of breath and desatting. Patient placed on HFNC at 6 lpm. O2 sats came up from 92 to 96%. Discussed options with patient as far as pulmonary toilet goes to help her clear secretions. Patient continues to not give a very good cough effort despite NTS, CPT, and quad cough assistance. RN made aware of conversation and paging MD. RT discussed with patient options of adding mucolytics and doing breathing treatments every 6 hours instead of TID. Explained to patient that she has a bad pneumonia and we are trying everything we can to help her clear secretions. Patient agreeable at this time. Will continue to monitor.

## 2018-10-28 NOTE — Evaluation (Signed)
Clinical/Bedside Swallow Evaluation Patient Details  Name: Sarah [name] MRN: [id number] Date of Birth: 10-Jun-1965  Today's Date: 10/28/2018 Time: SLP Start Time (ACUTE ONLY): 6834 SLP Stop Time (ACUTE ONLY): 1435 SLP Time Calculation (min) (ACUTE ONLY): 30 min  Past Medical History:  Past Medical History:  Diagnosis Date  . Anxiety   . 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 Chronic back pain   . Complication of anesthesia    Nausea and vomiting  . DDD (degenerative disc disease), cervical   . Depression   . DJD (degenerative joint disease)   . Family history of adverse reaction to anesthesia    MOTHER HAS NAUSEA   . GERD (gastroesophageal reflux disease)   . History of blood transfusion    08/2017  . History of rectal fissure   . HIT (heparin-induced thrombocytopenia) ([place])   . Hypertension    05/27/2018- not current- runs low  . Hypothyroidism   . Obesity   . Paraplegia at T4 level ([place])   . Pneumonia 03/2018  . Pulmonary embolus, right ([place]) 2015  . Rotator cuff tendonitis right   Past Surgical History:  Past Surgical History:  Procedure Laterality Date  . APPLICATION OF CRANIAL NAVIGATION N/A 06/01/2018   Procedure: APPLICATION OF CRANIAL NAVIGATION;  Surgeon: Erline Levine, MD;  Location: [place];  Service: Neurosurgery;  Laterality: N/A;  . CHOLECYSTECTOMY N/A 01/25/2018   Procedure: LAPAROSCOPIC CHOLECYSTECTOMY;  Surgeon: Virl Cagey, MD;  Location: [facility];  Service: General;  Laterality: N/A;  . CRANIOTOMY N/A 06/01/2018   Procedure: Craniectomy for skull metastasis with Brainlab;  Surgeon: Erline Levine, MD;  Location: [place];  Service: Neurosurgery;  Laterality: N/A;  . FEMUR IM NAIL Left 09/11/2017   Procedure: INTRAMEDULLARY (IM) RETROGRADE FEMORAL NAILING;  Surgeon: Altamese Blencoe, MD;  Location: [place];  Service: Orthopedics;  Laterality: Left;  . LAMINECTOMY N/A 12/14/2013   Procedure: THORACIC LAMINECTOMY FOR TUMOR  THORACIC THREE;  Surgeon: Ashok Pall, MD;  Location: [place] [facility];  Service: Neurosurgery;  Laterality: N/A;  THORACIC LAMINECTOMY FOR TUMOR THORACIC THREE  . LAMINECTOMY N/A 07/05/2015   Procedure: THORACIC LAMINECTOMY FOR TUMOR;  Surgeon: Ashok Pall, MD;  Location: [place] [facility];  Service: Neurosurgery;  Laterality: N/A;  . LAMINECTOMY N/A 09/03/2017   Procedure: POSTERIOR SPINAL TUMOR RESECTION THORACIC THREE;  Surgeon: Ashok Pall, MD;  Location: [place];  Service: Neurosurgery;  Laterality: N/A;  POSTERIOR SPINAL TUMOR RESECTION THORACIC THREE  . POSTERIOR LUMBAR FUSION 4 LEVEL N/A 12/30/2013   Procedure: Thoracic one-Thoracic five posterior thoracic fusion with pedicle screws;  Surgeon: Ashok Pall, MD;  Location: [place] [facility];  Service: Neurosurgery;  Laterality: N/A;  Thoracic one-Thoracic five posterior thoracic fusion with pedicle screws  . THYROIDECTOMY N/A 01/09/2014   Procedure: TOTAL THYROIDECTOMY;  Surgeon: Izora Gala, MD;  Location: [place];  Service: ENT;  Laterality: N/A;  . 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 paraplegia and sacral decubitus ulcer presenting with one week of sob from [facility] that worsened over 24 hours prior to admission.  Apparently, the patient had been diagnosed with pneumonia on 04/01/6220 at [place], but had not yet been started on antibiotics.  At baseline she is nonambulatory and transfers with sliding  board.  The patient has had numerous recent hospitalizations including from 08/09/2018 to 08/17/2018 during which time she was admitted for an infected sacral decubitus ulcer.  She had a debridement on 08/10/2018 by general surgery and was discharged to finish a short course of clindamycin.  CT of the pelvis was negative for osteomyelitis at  that time.  She subsequently stayed at [facility] from 08/16/2020 of 09/03/2018 after which she was discharged to [facility].  Chest x-ray revealed increasing bibasilar opacities concerning for pneumonia.  As result, the patient was started on vancomycin and cefepime and admitted for further treatment and evaluation. BSE requested.    Assessment / Plan / Recommendation Clinical Impression  Pt with generalized weakness with weak volitional cough and suspected pharyngeal weakness. Pt reports h/o thyroid CA with radiation which may be impacting pharyngeal swallow. She indicates that she could pretty much eat what she wanted a few weeks ago, but in the past week has noticed difficulty with swallowing. Oral motor examination reveals dysphonia (Pt just deep suctioned however), weak cough, mild lingual deviation to the left upon protrusion but WNL ROM. Pharyngeal swallow is difficult to palpate and Pt reports difficulty with initiation. Pt with signs of pharyngeal dysphagia and reduced airway protection with limited trials. She has not been eating much at all per Pt/family. Recommend NPO except for ice chips, sips of water, and po medications as needed until MBSS tomorrow. Pt/RN in agreement with plan of care.   SLP Visit Diagnosis: Dysphagia, oropharyngeal phase (R13.12)    Aspiration Risk  Moderate aspiration risk;Risk for inadequate nutrition/hydration    Diet Recommendation NPO except meds;Ice chips PRN after oral care;Free water protocol after oral care   Liquid Administration via: Cup Medication Administration: Crushed with puree Supervision: Staff to assist with self feeding    Other  Recommendations Oral Care Recommendations: Oral care prior to ice chip/H20;Staff/trained caregiver to provide oral care   Follow up Recommendations Skilled Nursing facility      Frequency and Duration min 2x/week  1 week       Prognosis Prognosis for Safe Diet Advancement: Fair Barriers to Reach Goals: Severity of  deficits      Swallow Study   General Date of Onset: 11/17/2018 HPI: 53 yo F with history of pulmonary emboli maintained on Xarelto, myelopathy and paraplegia 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 paraplegia and sacral decubitus ulcer presenting with one week of sob from [facility] that worsened over 24 hours prior to admission.  Apparently, the patient had been diagnosed with pneumonia on 11/26/1738 at [place], but had not yet been started on antibiotics.  At baseline she is nonambulatory and transfers with sliding board.  The patient has had numerous recent hospitalizations including from 08/09/2018 to 08/17/2018 during which time she was admitted for an infected sacral decubitus ulcer.  She had a debridement on 08/10/2018 by general surgery and was discharged to finish a short course of clindamycin.  CT of the pelvis was negative for osteomyelitis at that time.  She subsequently stayed at [facility] from 08/16/2020 of 09/03/2018 after which she was discharged to [facility].  Chest x-ray revealed increasing bibasilar opacities concerning for pneumonia.  As result, the patient was started on vancomycin and cefepime and admitted for further treatment and evaluation. BSE requested.  Type of Study: Bedside Swallow Evaluation Previous Swallow Assessment: None on record Diet Prior to this Study: Dysphagia 3 (soft);Thin  liquids Temperature Spikes Noted: No Respiratory Status: Nasal cannula History of Recent Intubation: No Behavior/Cognition: Alert;Cooperative;Pleasant mood Oral Cavity Assessment: Within Functional Limits Oral Care Completed by SLP: Recent completion by staff Oral Cavity - Dentition: Adequate natural dentition Vision: Functional for self-feeding Self-Feeding Abilities: Able to feed self Patient Positioning: Upright in bed Baseline Vocal Quality: Breathy;Hoarse Volitional Cough: Weak Volitional  Swallow: Unable to elicit    Oral/Motor/Sensory Function Overall Oral Motor/Sensory Function: Mild impairment Facial ROM: Within Functional Limits Facial Symmetry: Within Functional Limits Facial Strength: Within Functional Limits Facial Sensation: Within Functional Limits Lingual ROM: Within Functional Limits Lingual Symmetry: Abnormal symmetry left(mild deviation to the left) Lingual Strength: Within Functional Limits Lingual Sensation: Within Functional Limits Velum: Impaired right Mandible: Within Functional Limits   Ice Chips Ice chips: Impaired Presentation: Spoon Pharyngeal Phase Impairments: Suspected delayed Swallow;Decreased hyoid-laryngeal movement;Unable to trigger swallow;Throat Clearing - Delayed   Thin Liquid Thin Liquid: Impaired Presentation: Spoon;Cup Pharyngeal  Phase Impairments: Suspected delayed Swallow;Decreased hyoid-laryngeal movement;Throat Clearing - Immediate    Nectar Thick Nectar Thick Liquid: Not tested   Honey Thick Honey Thick Liquid: Not tested   Puree Puree: Impaired Presentation: Spoon Pharyngeal Phase Impairments: Suspected delayed Swallow;Decreased hyoid-laryngeal movement;Multiple swallows   Solid     Solid: Not tested     Thank you,  Genene Churn, [place]  , 10/28/2018,2:52 PM

## 2018-10-28 NOTE — Progress Notes (Signed)
Respiratory placed patient on high flow nasal cannula at 6 liters. Oxygen sats came up to 96-97%. Will continue to monitor patient throughout shift.

## 2018-10-28 NOTE — Progress Notes (Signed)
Patient refused to take synthroid 0600 dose this am. Will continue to monitor throughout shift.

## 2018-10-28 NOTE — Plan of Care (Signed)

## 2018-10-28 NOTE — Progress Notes (Signed)
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 paraplegia and sacral decubitus ulcer presenting with one week of sob from [place] that worsened over 24 hours prior to admission.  Apparently, the patient had been diagnosed with pneumonia on 07/27/2128 at [place], but had not yet been started on antibiotics.  At baseline she is nonambulatory and transfers with sliding board.  The patient has had numerous recent hospitalizations including from 08/09/2018 to 08/17/2018 during which time she was admitted for an infected sacral decubitus ulcer.  She had a debridement on 08/10/2018 by general surgery and was discharged to finish a short course of clindamycin.  CT of the pelvis was negative for osteomyelitis at that time.  She subsequently stayed at [facility] from 08/16/2020 of 09/03/2018 after which she was discharged to [facility].  The patient had complained of some subjective fever and chills, but denied any vomiting, diarrhea, abdominal pain, headache, neck pain, hemoptysis.  In the emergency department, the patient was afebrile with soft blood pressures in the 90s.  Oxygen saturation was initially 87% on 2 L, and improved to 94% on 4 L.  Chest x-ray revealed increasing bibasilar opacities concerning for pneumonia.  As result, the patient was started on vancomycin and cefepime and admitted for further treatment and evaluation.  Assessment/Plan: Acute respiratory failure with hypoxia -Secondary to pneumonia -Presently stable on 3 L -Wean oxygen for saturation greater than 92% -SARS-CoV2 neg  HCAP/aspiration PNA -MRSA PCR neg -Vancomycin discontinued on 8/4; zosyn discontinued on 8/6 -continue  current antibiotics; will use now meropenem  -patient is afebrile -still requiring O2 supplementation -Very poor cough and diffuse rhonchi on exam; start chest physiotherapy. -low dose steroids will be added and continue nebulizer.  ESBL E. coli and Morganella UTI -> 100,000 colonies present -denies dysuria, but with chronic paraplegia not reliable symptoms. -Antibiotic has been adjusted to meropenem following sensitivity. -She is chronically dependent to indwelling catheter secondary to neurogenic bladder and will meet criteria for catheter associated UTI.  Hypotension -This has been a chronic issue for the patient with labile blood pressures -Likely due to the patient's dysautonomia from her spinal cord metastasis/paraplegia -Continue Florinef, dose adjusted to BID. -follow blood cultures and urine culture  myelopathy and paraplegia -secondary to metastatic Hurthle cancerto the thoracic spine status post recurrence of T3/T4 spinal tumor with resection June 2019 -Continue baclofen -Patient has neurogenic bladder and bowel  Stage IV sacral decubitus ulcer -Personally examined wound-->100% granulation without signs of superimposed infection. -Present on admission -continue daily wet-to-dry dressings and prevention measures.  Transaminasemia -likely due to hepatic congestion from infx -RUQ US demonstrating hepatic steatosis no focal liver lesions are evident on this study, but [place] sensitivity is diminished for the circumstances.  Hurthle Cell Carcinoma -continue outpatient follow up with [name]  Hypothyroidism -Continue Synthroid  History of pulmonary embolus -Continue rivaroxaban -No signs of overt bleeding.  Depression -Continue Zoloft  Anemia of chronic disease -Baseline hemoglobin~8 -Continue iron supplementation  Dysphagia -will follow speech therapy recommendations -MBS tomorrow -NPO for now    Disposition Plan:   SNF in 1-2 days;  Family Communication:    No Family at bedside  Consultants:  Palliative Care  Code Status:  FULL   DVT Prophylaxis:  Xarelto   Procedures: As Listed in Progress Note Above  Antibiotics: Zosyn 8/3>>>8/6 vanco 8/3>>8/4 Meropenem 8/6>>>>   Subjective: No fever, no headaches, no chest pain.  Patient reports still feeling short of breath, having difficulty swallowing and feeling congested.  She is also weak and not feeling well.  Objective: Vitals:   10/28/18 0555 10/28/18 0715 10/28/18 1131 10/28/18 1328  BP: 100/65  95/63   Pulse: 77  64   Resp: 20     Temp: 97.6 F (36.4 C)     TempSrc: Oral     SpO2: 99% 99% 98% 99%  Weight:      Height:        Intake/Output Summary (Last 24 hours) at 10/28/2018 1606 Last data filed at 10/28/2018 1539 Gross per 24 hour  Intake 1631.61 ml  Output -  Net 1631.61 ml   Weight change:   Exam: General exam: Alert, awake, oriented x 3; afebrile, reports to feel short of breath, congested and having difficulty swallowing.  Patient with weak cough and unable to bring secretions up. Respiratory system: Diffuse rhonchi, mild tachypnea, no using accessory muscles.  No wheezing currently. Cardiovascular system: RRR. No murmurs, rubs, gallops. Gastrointestinal system: Abdomen is nondistended, soft and nontender. No organomegaly or masses felt. Normal bowel sounds heard. Central nervous system: Alert and oriented. No new focal neurological deficits. Extremities: No cyanosis or clubbing.  Trace edema bilaterally. Skin: No petechiae.  Stage IV bilateral ischial ulcers with no signs of superimposed infection or active drainage.  Granulation tissue appreciated.  Patient's ulcers has been present prior to admission. Psychiatry: Judgement and insight appear normal. Mood & affect appropriate.    Data Reviewed: I have personally reviewed following labs and imaging studies  Basic Metabolic Panel: Recent Labs  Lab 11/22/2018 1656 10/26/18 0607 10/27/18 0546  NA 141 141 140   K 3.7 4.1 3.4*  CL 106 104 105  CO2 23 19* 24  GLUCOSE 73 135* 88  BUN 18 22* 24*  CREATININE 0.86 1.05* 1.02*  CALCIUM 7.6* 7.7* 7.4*   Liver Function Tests: Recent Labs  Lab 11/06/2018 1656 10/26/18 0607 10/27/18 0546  AST 130* 92* 54*  ALT 31 27 22   ALKPHOS 178* 156* 122  BILITOT 1.0 1.9* 1.2  PROT 6.0* 5.7* 5.2*  ALBUMIN 2.3* 2.2* 1.9*   Coagulation Profile: Recent Labs  Lab 11/15/2018 1656  INR 2.5*   CBC: Recent Labs  Lab 11/09/2018 1656 10/26/18 0607 10/27/18 0546  WBC 6.9 12.7* 11.2*  NEUTROABS 6.0  --  10.4*  HGB 8.3* 8.0* 7.2*  HCT 28.5* 27.4* 24.4*  MCV 100.0 99.6 97.6  PLT 199 201 188   Urine analysis:    Component Value Date/Time   COLORURINE YELLOW 11/10/2018 1826   APPEARANCEUR HAZY (A) 11/19/2018 1826   LABSPEC 1.013 10/24/2018 1826   PHURINE 6.0 10/31/2018 1826   GLUCOSEU NEGATIVE 11/06/2018 1826   HGBUR NEGATIVE 10/24/2018 1826   BILIRUBINUR NEGATIVE 11/02/2018 1826   BILIRUBINUR MODERATE 03/03/2014 1028   KETONESUR 20 (A) 11/10/2018 1826   PROTEINUR NEGATIVE 10/31/2018 1826   UROBILINOGEN 1.0 03/11/2014 2130   NITRITE POSITIVE (A) 11/02/2018 1826   LEUKOCYTESUR MODERATE (A) 10/26/2018 1826    Recent Results (from the past 240 hour(s))  Blood Culture (routine x 2)     Status: None (Preliminary result)   Collection Time: 11/13/2018  4:56 PM   Specimen: Right Antecubital; Blood  Result Value  Ref Range Status   Specimen Description   Final    RIGHT ANTECUBITAL BOTTLES DRAWN AEROBIC AND ANAEROBIC   Special Requests   Final    Blood Culture results may not be optimal due to an excessive volume of blood received in culture bottles   Culture   Final    NO GROWTH 3 DAYS Performed at [facility], [address], [place], [place] [address]    Report Status PENDING  Incomplete  Blood Culture (routine x 2)     Status: None (Preliminary result)   Collection Time: 11/20/2018  5:06 PM   Specimen: Left Antecubital; Blood  Result Value Ref Range  Status   Specimen Description   Final    LEFT ANTECUBITAL BOTTLES DRAWN AEROBIC AND ANAEROBIC   Special Requests Blood Culture adequate volume  Final   Culture   Final    NO GROWTH 3 DAYS Performed at [facility], [address], [place], [place] [address]    Report Status PENDING  Incomplete  SARS Coronavirus 2 [facility] order, Performed in [place] hospital lab) Nasopharyngeal Nasopharyngeal Swab     Status: None   Collection Time: 10/31/2018  5:36 PM   Specimen: Nasopharyngeal Swab  Result Value Ref Range Status   SARS Coronavirus 2 NEGATIVE NEGATIVE Final    Comment: (NOTE) If result is NEGATIVE SARS-CoV-2 target nucleic acids are NOT DETECTED. The SARS-CoV-2 RNA is generally detectable in upper and lower  respiratory specimens during the acute phase of infection. The lowest  concentration of SARS-CoV-2 viral copies this assay can detect is 250  copies / mL. A negative result does not preclude SARS-CoV-2 infection  and should not be used as the sole basis for treatment or other  patient management decisions.  A negative result may occur with  improper specimen collection / handling, submission of specimen other  than nasopharyngeal swab, presence of viral mutation(s) within the  areas targeted by this assay, and inadequate number of viral copies  (<250 copies / mL). A negative result must be combined with clinical  observations, patient history, and epidemiological information. If result is POSITIVE SARS-CoV-2 target nucleic acids are DETECTED. The SARS-CoV-2 RNA is generally detectable in upper and lower  respiratory specimens dur ing the acute phase of infection.  Positive  results are indicative of active infection with SARS-CoV-2.  Clinical  correlation with patient history and other diagnostic information is  necessary to determine patient infection status.  Positive results do  not rule out bacterial infection or co-infection with other viruses. If result is  PRESUMPTIVE POSTIVE SARS-CoV-2 nucleic acids MAY BE PRESENT.   A presumptive positive result was obtained on the submitted specimen  and confirmed on repeat testing.  While 2019 novel coronavirus  (SARS-CoV-2) nucleic acids may be present in the submitted sample  additional confirmatory testing may be necessary for epidemiological  and / or clinical management purposes  to differentiate between  SARS-CoV-2 and other Sarbecovirus currently known to infect humans.  If clinically indicated additional testing with an alternate test  methodology [phone]) is advised. The SARS-CoV-2 RNA is generally  detectable in upper and lower respiratory sp ecimens during the acute  phase of infection. The expected result is Negative. Fact Sheet for Patients:  StrictlyIdeas.no Fact Sheet for Healthcare Providers: BankingDealers.co.za This test is not yet approved or cleared by the [place] FDA and has been authorized for detection and/or diagnosis of SARS-CoV-2 by FDA under an Emergency Use Authorization (EUA).  This EUA will remain  in effect (meaning this test can be used) for the duration of the COVID-19 declaration under Section 564(b)(1) of the Act, 21 U.S.C. section 360bbb-3(b)(1), unless the authorization is terminated or revoked sooner. Performed at [facility], [address] [place], [place] [address]   Urine culture     Status: Abnormal   Collection Time: 11/21/2018  6:26 PM   Specimen: In/Out Cath Urine  Result Value Ref Range Status   Specimen Description   Final    IN/OUT CATH URINE Performed at [facility], [address] [place], [place] [address]    Special Requests   Final    NONE Performed at [facility], [address] [place], [place] [address]    Culture (A)  Final    >=100,000 COLONIES/mL MORGANELLA MORGANII >=100,000 COLONIES/mL ESCHERICHIA COLI Confirmed Extended Spectrum Beta-Lactamase Producer (ESBL).  In bloodstream  infections from ESBL organisms, carbapenems are preferred over piperacillin/tazobactam. They are shown to have a lower risk of mortality.    Report Status 10/28/2018 FINAL  Final   Organism ID, Bacteria MORGANELLA MORGANII (A)  Final   Organism ID, Bacteria ESCHERICHIA COLI (A)  Final      Susceptibility   Escherichia coli - MIC*    AMPICILLIN >=32 RESISTANT Resistant     CEFAZOLIN >=64 RESISTANT Resistant     CEFTRIAXONE RESISTANT Resistant     CIPROFLOXACIN 2 INTERMEDIATE Intermediate     GENTAMICIN >=16 RESISTANT Resistant     IMIPENEM 1 SENSITIVE Sensitive     NITROFURANTOIN 128 RESISTANT Resistant     TRIMETH/SULFA >=320 RESISTANT Resistant     AMPICILLIN/SULBACTAM >=32 RESISTANT Resistant     PIP/TAZO 32 INTERMEDIATE Intermediate     Extended ESBL POSITIVE Resistant     * >=100,000 COLONIES/mL ESCHERICHIA COLI   Morganella morganii - MIC*    AMPICILLIN >=32 RESISTANT Resistant     CEFAZOLIN >=64 RESISTANT Resistant     CEFTRIAXONE 8 SENSITIVE Sensitive     CIPROFLOXACIN 2 INTERMEDIATE Intermediate     GENTAMICIN >=16 RESISTANT Resistant     IMIPENEM 2 SENSITIVE Sensitive     NITROFURANTOIN 128 RESISTANT Resistant     TRIMETH/SULFA >=320 RESISTANT Resistant     AMPICILLIN/SULBACTAM >=32 RESISTANT Resistant     PIP/TAZO <=4 SENSITIVE Sensitive     * >=100,000 COLONIES/mL MORGANELLA MORGANII  MRSA PCR Screening     Status: None   Collection Time: 10/30/2018 10:31 PM   Specimen: Nasal Mucosa; Nasopharyngeal  Result Value Ref Range Status   MRSA by PCR NEGATIVE NEGATIVE Final    Comment:        The GeneXpert MRSA Assay (FDA approved for NASAL specimens only), is one component of a comprehensive MRSA colonization surveillance program. It is not intended to diagnose MRSA infection nor to guide or monitor treatment for MRSA infections. Performed at [facility], [address] [place], [place] [address]      Scheduled Meds: . baclofen  20 mg Oral TID  . bisacodyl  10  mg Rectal Daily  . feeding supplement (ENSURE ENLIVE)  237 mL Oral BID BM  . ferrous sulfate  325 mg Oral Q48H  . fludrocortisone  0.1 mg Oral BID  . fluticasone  1 spray Each Nare Daily  . guaiFENesin  600 mg Oral BID  . ipratropium  0.5 mg Nebulization TID  . levalbuterol  1.25 mg Nebulization TID  . levothyroxine  150 mcg Oral Q0600  . loratadine  10 mg Oral Daily  . multivitamin with minerals  1 tablet Oral Daily  . nutrition supplement (JUVEN)  1 packet Oral BID BM  . pantoprazole  40 mg Oral Daily  . polyethylene glycol  17 g Oral Daily  . rivaroxaban  20 mg Oral Q supper  . sertraline  50 mg Oral Daily  . sodium chloride flush  3 mL Intravenous Q12H   Continuous Infusions: . sodium chloride    . sodium chloride 75 mL/hr at 10/28/18 1539  . meropenem (MERREM) IV Stopped (10/28/18 1200)    Procedures/Studies: Dg Chest Port 1 View  Result Date: 11/17/2018 CLINICAL DATA:  Shortness of breath, diagnosed with pneumonia 1 day prior EXAM: PORTABLE CHEST 1 VIEW COMPARISON:  Radiograph Aug 09, 2018 FINDINGS: Bilateral airspace disease most pronounced at the left lung base with associated volume loss. No pneumothorax. No effusion. Cardiomediastinal contours are unremarkable portable technique and hypoventilatory changes. Degenerative changes are present in the spine. Cervicothoracic fusion hardware is partially included in the imaging without visible complication. No acute osseous or soft tissue abnormality. IMPRESSION: Bilateral airspace disease compatible with multifocal pneumonia. Electronically Signed   By: Lovena Le M.D.   On: 10/26/2018 17:35   US Abdomen Limited Ruq  Result Date: 10/26/2018 CLINICAL DATA:  Elevated liver enzymes EXAM: ULTRASOUND ABDOMEN LIMITED RIGHT UPPER QUADRANT COMPARISON:  None. FINDINGS: Gallbladder: Surgically absent. Common bile duct: Diameter: 6 mm. No intrahepatic or extrahepatic biliary duct dilatation. Liver: No focal lesion identified. Liver  echogenicity is increased diffusely. Portal vein is patent on color Doppler imaging with normal direction of blood flow towards the liver. Other: Small right pleural effusion noted. IMPRESSION: 1. Increase in liver echogenicity, a finding indicative of hepatic steatosis. While no focal liver lesions are evident on this study, it must be cautioned that the sensitivity of ultrasound for detection of focal liver lesions is diminished in this circumstance. 2.  Gallbladder absent. 3.  Small right pleural effusion. Electronically Signed   By: Lowella Grip III M.D.   On: 10/26/2018 14:35    Barton Dubois, MD  [facility] Pager [phone]  10/28/2018, 4:06 PM   LOS: 3 days

## 2018-10-28 NOTE — Progress Notes (Signed)
Patient stating she cant breathe. Breathing treatment given by respiratory. Patient appears to be anxious. Paged [facility] for orders. Will continue to monitor throughout shift.

## 2018-10-28 NOTE — Progress Notes (Signed)
Palliative: [name] [name] [name], [name] [name], is lying quietly in bed.  She will briefly open her eyes and make eye contact but quickly closes her eyes again.  Her mother is at bedside and tells me that [name] [name] [name] does not feel well today.  We talked a little bit about her breathing, respiratory therapy.  [name] [name] [name] again today declines to have palliative discussion.  Palliative medicine team to continue to follow/shadow. At this point [name] [name] [name]'s goals are set for full code/full scope.  Plan: Continue to treat the treatable, full code/full scope.  No charge Sarah Axe, NP Palliative Medicine Team Team Phone # [phone] Greater than 50% of this time was spent counseling and coordinating care related to the above assessment and plan.

## 2018-10-28 NOTE — Progress Notes (Signed)
Breathing treatment given via [employer].

## 2018-10-29 ENCOUNTER — Inpatient Hospital Stay (HOSPITAL_COMMUNITY): Payer: Medicare Other

## 2018-10-29 ENCOUNTER — Inpatient Hospital Stay: Admission: AD | Admit: 2018-10-29 | Payer: Medicare Other | Admitting: Specialist

## 2018-10-29 LAB — LACTIC ACID, PLASMA: Lactic Acid, Venous: 1 mmol/L (ref 0.5–1.9)

## 2018-10-29 LAB — COMPREHENSIVE METABOLIC PANEL
ALT: 20 U/L (ref 0–44)
AST: 25 U/L (ref 15–41)
Albumin: 1.8 g/dL — ABNORMAL LOW (ref 3.5–5.0)
Alkaline Phosphatase: 101 U/L (ref 38–126)
Anion gap: 15 (ref 5–15)
BUN: 30 mg/dL — ABNORMAL HIGH (ref 6–20)
CO2: 18 mmol/L — ABNORMAL LOW (ref 22–32)
Calcium: 7.7 mg/dL — ABNORMAL LOW (ref 8.9–10.3)
Chloride: 111 mmol/L (ref 98–111)
Creatinine, Ser: 1.07 mg/dL — ABNORMAL HIGH (ref 0.44–1.00)
GFR calc Af Amer: >60 mL/min (ref >60)
GFR calc non Af Amer: 60 mL/min — ABNORMAL LOW (ref >60)
Glucose, Bld: 101 mg/dL — ABNORMAL HIGH (ref 70–99)
Potassium: 2.5 mmol/L — CL (ref 3.5–5.1)
Sodium: 144 mmol/L (ref 135–145)
Total Bilirubin: 1.3 mg/dL — ABNORMAL HIGH (ref 0.3–1.2)
Total Protein: 5.5 g/dL — ABNORMAL LOW (ref 6.5–8.1)

## 2018-10-29 LAB — CBC
HCT: 28.5 % — ABNORMAL LOW (ref 36.0–46.0)
Hemoglobin: 8.4 g/dL — ABNORMAL LOW (ref 12.0–15.0)
MCH: 29.6 pg (ref 26.0–34.0)
MCHC: 29.5 g/dL — ABNORMAL LOW (ref 30.0–36.0)
MCV: 100.4 fL — ABNORMAL HIGH (ref 80.0–100.0)
Platelets: 175 10*3/uL (ref 150–400)
RBC: 2.84 MIL/uL — ABNORMAL LOW (ref 3.87–5.11)
RDW: 19.7 % — ABNORMAL HIGH (ref 11.5–15.5)
WBC: 10.2 10*3/uL (ref 4.0–10.5)
nRBC: 0 % (ref 0.0–0.2)

## 2018-10-29 LAB — BLOOD GAS, ARTERIAL
Acid-base deficit: 8 mmol/L — ABNORMAL HIGH (ref 0.0–2.0)
Bicarbonate: 17.4 mmol/L — ABNORMAL LOW (ref 20.0–28.0)
FIO2: 80
O2 Saturation: 93.9 %
Patient temperature: 37
pCO2 arterial: 48.2 mmHg — ABNORMAL HIGH (ref 32.0–48.0)
pH, Arterial: 7.208 — ABNORMAL LOW (ref 7.350–7.450)
pO2, Arterial: 81.6 mmHg — ABNORMAL LOW (ref 83.0–108.0)

## 2018-10-29 LAB — TROPONIN I (HIGH SENSITIVITY): Troponin I (High Sensitivity): 3.1 ng/L (ref <18)

## 2018-10-29 LAB — CREATININE, SERUM
Creatinine, Ser: 0.9 mg/dL (ref 0.44–1.00)
GFR calc Af Amer: >60 mL/min (ref >60)
GFR calc non Af Amer: >60 mL/min (ref >60)

## 2018-10-29 LAB — NOVEL CORONAVIRUS, NAA (HOSP ORDER, SEND-OUT TO REF LAB; TAT 18-24 HRS): SARS-CoV-2, NAA: NOT DETECTED

## 2018-10-29 LAB — GLUCOSE, CAPILLARY
Glucose-Capillary: 94 mg/dL (ref 70–99)
Glucose-Capillary: 93 mg/dL (ref 70–99)
Glucose-Capillary: 97 mg/dL (ref 70–99)

## 2018-10-29 MED ORDER — SODIUM CHLORIDE 0.9 % IV BOLUS: 1000.0000 mL | Freq: Once | INTRAVENOUS | Status: DC

## 2018-10-29 MED ORDER — VANCOMYCIN HCL IN DEXTROSE 1-5 GM/200ML-% IV SOLN
1000.0000 mg | INTRAVENOUS | Status: AC
  Filled 2018-10-29: qty 200

## 2018-10-29 MED ORDER — ENOXAPARIN SODIUM 80 MG/0.8ML ~~LOC~~ SOLN
80.0000 mg | Freq: Two times a day (BID) | SUBCUTANEOUS | Status: DC
  Administered 2018-10-29: 80 mg via SUBCUTANEOUS
  Filled 2018-10-29 (×2): qty 0.8

## 2018-10-29 MED ORDER — VANCOMYCIN HCL IN DEXTROSE 1-5 GM/200ML-% IV SOLN
1000.0000 mg | INTRAVENOUS | Status: DC
  Administered 2018-10-30: 1000 mg via INTRAVENOUS
  Filled 2018-10-29: qty 200

## 2018-10-29 MED ORDER — ACETYLCYSTEINE 20 % IN SOLN
RESPIRATORY_TRACT | Status: AC
  Administered 2018-10-29: 4 mL via RESPIRATORY_TRACT
  Filled 2018-10-29: qty 4

## 2018-10-29 MED ORDER — POTASSIUM CHLORIDE 10 MEQ/100ML IV SOLN
10.0000 meq | INTRAVENOUS | Status: AC
  Administered 2018-10-30 (×4): 10 meq via INTRAVENOUS
  Filled 2018-10-29 (×4): qty 100

## 2018-10-29 MED ORDER — LEVALBUTEROL HCL 1.25 MG/0.5ML IN NEBU
1.2500 mg | INHALATION_SOLUTION | Freq: Four times a day (QID) | RESPIRATORY_TRACT | Status: DC
  Administered 2018-10-29: 1.25 mg via RESPIRATORY_TRACT
  Filled 2018-10-29: qty 0.5

## 2018-10-29 MED ORDER — PANTOPRAZOLE SODIUM 40 MG IV SOLR
40.0000 mg | INTRAVENOUS | Status: DC
  Administered 2018-10-29 – 2018-10-30 (×2): 40 mg via INTRAVENOUS
  Filled 2018-10-29 (×2): qty 40

## 2018-10-29 MED ORDER — LEVALBUTEROL HCL 1.25 MG/0.5ML IN NEBU
1.2500 mg | INHALATION_SOLUTION | Freq: Four times a day (QID) | RESPIRATORY_TRACT | Status: DC
  Administered 2018-10-29 – 2018-11-04 (×24): 1.25 mg via RESPIRATORY_TRACT
  Filled 2018-10-29 (×27): qty 0.5

## 2018-10-29 MED ORDER — SODIUM CHLORIDE 0.9 % IV BOLUS
1000.0000 mL | Freq: Once | INTRAVENOUS | Status: AC
  Administered 2018-10-29: 1000 mL via INTRAVENOUS

## 2018-10-29 MED ORDER — VANCOMYCIN HCL IN DEXTROSE 1-5 GM/200ML-% IV SOLN: 1000.0000 mg | Freq: Three times a day (TID) | INTRAVENOUS | Status: DC

## 2018-10-29 MED ORDER — FLUCONAZOLE 100MG IVPB
100.0000 mg | INTRAVENOUS | Status: DC
  Administered 2018-10-29 – 2018-11-01 (×4): 100 mg via INTRAVENOUS
  Filled 2018-10-29 (×10): qty 50

## 2018-10-29 MED ORDER — LEVOTHYROXINE SODIUM 100 MCG/5ML IV SOLN
75.0000 ug | Freq: Every day | INTRAVENOUS | Status: DC
  Administered 2018-10-29 – 2018-10-30 (×2): 75 ug via INTRAVENOUS
  Filled 2018-10-29 (×3): qty 5

## 2018-10-29 MED ORDER — ACETYLCYSTEINE 10 % IN SOLN: 4.0000 mL | RESPIRATORY_TRACT | Status: DC

## 2018-10-29 MED ORDER — HYDROCORTISONE NA SUCCINATE PF 100 MG IJ SOLR
100.0000 mg | Freq: Three times a day (TID) | INTRAMUSCULAR | Status: DC
  Administered 2018-10-29 – 2018-11-01 (×9): 100 mg via INTRAVENOUS
  Filled 2018-10-29 (×9): qty 2

## 2018-10-29 MED ORDER — LORAZEPAM 2 MG/ML IJ SOLN
0.5000 mg | Freq: Three times a day (TID) | INTRAMUSCULAR | Status: DC | PRN
  Administered 2018-11-01: 0.5 mg via INTRAVENOUS
  Filled 2018-10-29: qty 1

## 2018-10-29 MED ORDER — ACETYLCYSTEINE 20 % IN SOLN
4.0000 mL | Freq: Four times a day (QID) | RESPIRATORY_TRACT | Status: DC
  Administered 2018-10-29 – 2018-11-01 (×13): 4 mL via RESPIRATORY_TRACT
  Filled 2018-10-29 (×14): qty 4

## 2018-10-29 MED ORDER — METHOCARBAMOL 1000 MG/10ML IJ SOLN: 500.0000 mg | Freq: Three times a day (TID) | INTRAVENOUS | Status: DC | PRN

## 2018-10-29 MED ORDER — ACETYLCYSTEINE 10 % IN SOLN
4.0000 mL | Freq: Four times a day (QID) | RESPIRATORY_TRACT | Status: DC
  Filled 2018-10-29 (×3): qty 4

## 2018-10-29 MED ORDER — PHENYLEPHRINE HCL-NACL 10-0.9 MG/250ML-% IV SOLN
0.0000 ug/min | INTRAVENOUS | Status: DC
  Administered 2018-10-30: 11:00:00 100 ug/min via INTRAVENOUS
  Administered 2018-10-30: 13:00:00 90 ug/min via INTRAVENOUS
  Administered 2018-10-30: 15:00:00 110 ug/min via INTRAVENOUS
  Administered 2018-10-30: 18:00:00 100 ug/min via INTRAVENOUS
  Administered 2018-10-30: 30 ug/min via INTRAVENOUS
  Administered 2018-10-30 (×2): 70 ug/min via INTRAVENOUS
  Administered 2018-10-30: 10:00:00 80 ug/min via INTRAVENOUS
  Administered 2018-10-30: 16:00:00 100 ug/min via INTRAVENOUS
  Filled 2018-10-29: qty 250
  Filled 2018-10-29: qty 500
  Filled 2018-10-29 (×2): qty 250
  Filled 2018-10-29: qty 750
  Filled 2018-10-29 (×3): qty 250

## 2018-10-29 NOTE — Progress Notes (Signed)
Patient still complaining of not being able to breathe. Respiratory attempted to give breathing treatment. Patient requesting to talk to MD. Paged MD awaiting for response. Will continue to monitor throughout shift.

## 2018-10-29 NOTE — Progress Notes (Signed)
Patient refuses to even try CPAP machine. Unit left at bedside and RN made aware. [name] resting at this time and seems to be breathing just fine.

## 2018-10-29 NOTE — Progress Notes (Signed)
Rapid response called on patient. Patient turned up to 15 LPM HFNC. ABG drawn and NTS performed. MD and RN at bedside.

## 2018-10-29 NOTE — Progress Notes (Signed)
Pharmacy Antibiotic Note  Sarah [name] is a 53 y.o. female admitted on 10/28/2018 with pneumonia.  Pharmacy has been consulted for Vancomycin dosing.  Plan: Vancomycin 1000 mg  IV every 24 hours.  Goal trough 15-20 mcg/mL. Similar dose to previous admission  Monitor labs, c/s, and vanco levels as indicated.  Height: 5\' 11"  (180.3 cm) Weight: 192 lb 0.3 oz (87.1 kg) IBW/kg (Calculated) : 70.8  Temp (24hrs), Avg:97 F (36.1 C), Min:96.9 F (36.1 C), Max:97 F (36.1 C)  Recent Labs  Lab 10/24/2018 1656 10/26/18 0607 10/27/18 0546 10/29/18 0500 10/29/18 2141  WBC 6.9 12.7* 11.2*  --  10.2  CREATININE 0.86 1.05* 1.02* 0.90  --   LATICACIDVEN 0.6  --   --   --   --     Estimated Creatinine Clearance: 89.2 mL/min (by C-G formula based on SCr of 0.9 mg/dL).    Allergies  Allergen Reactions  . Bee Venom Anaphylaxis  . Heparin Other (See Comments)    Weak positive platelets induced antibodies, SRA negative--2015  . Keflex [Cephalexin] Nausea And Vomiting  . Tramadol Nausea And Vomiting  . Gabapentin Nausea And Vomiting  . Hydrochlorothiazide Nausea And Vomiting  . Hydrocodone Nausea And Vomiting  . Oxycodone-Acetaminophen Nausea And Vomiting  . Sorbitol Nausea Only    And abdominal distress  . Tizanidine     Fatigue     Antimicrobials this admission: Vanco 8/3 >> 8/4 Restart 8/7 Zosyn 8/3 >> 8/4 merrem 8/6 >   Dose adjustments this admission: N/A  Microbiology results:  >=100,000 COLONIES/mL MORGANELLA MORGANII  >=100,000 COLONIES/mL ESCHERICHIA COLI  s-imipenem Recent Results (from the past 240 hour(s))  Blood Culture (routine x 2)     Status: None (Preliminary result)   Collection Time: 11/20/2018  4:56 PM   Specimen: Right Antecubital; Blood  Result Value Ref Range Status   Specimen Description   Final    RIGHT ANTECUBITAL BOTTLES DRAWN AEROBIC AND ANAEROBIC   Special Requests   Final    Blood Culture results may not be optimal due to an excessive volume of  blood received in culture bottles   Culture   Final    NO GROWTH 4 DAYS Performed at [facility], [address] [place], [place] [address]    Report Status PENDING  Incomplete  Blood Culture (routine x 2)     Status: None (Preliminary result)   Collection Time: 11/22/2018  5:06 PM   Specimen: Left Antecubital; Blood  Result Value Ref Range Status   Specimen Description   Final    LEFT ANTECUBITAL BOTTLES DRAWN AEROBIC AND ANAEROBIC   Special Requests Blood Culture adequate volume  Final   Culture   Final    NO GROWTH 4 DAYS Performed at [facility], [address] [place], [place] [address]    Report Status PENDING  Incomplete  SARS Coronavirus 2 [facility] order, Performed in [place] hospital lab) Nasopharyngeal Nasopharyngeal Swab     Status: None   Collection Time: 10/26/2018  5:36 PM   Specimen: Nasopharyngeal Swab  Result Value Ref Range Status   SARS Coronavirus 2 NEGATIVE NEGATIVE Final    Comment: (NOTE) If result is NEGATIVE SARS-CoV-2 target nucleic acids are NOT DETECTED. The SARS-CoV-2 RNA is generally detectable in upper and lower  respiratory specimens during the acute phase of infection. The lowest  concentration of SARS-CoV-2 viral copies this assay can detect is 250  copies / mL. A negative result does not preclude SARS-CoV-2 infection  and  should not be used as the sole basis for treatment or other  patient management decisions.  A negative result may occur with  improper specimen collection / handling, submission of specimen other  than nasopharyngeal swab, presence of viral mutation(s) within the  areas targeted by this assay, and inadequate number of viral copies  (<250 copies / mL). A negative result must be combined with clinical  observations, patient history, and epidemiological information. If result is POSITIVE SARS-CoV-2 target nucleic acids are DETECTED. The SARS-CoV-2 RNA is generally detectable in upper and lower  respiratory specimens dur ing  the acute phase of infection.  Positive  results are indicative of active infection with SARS-CoV-2.  Clinical  correlation with patient history and other diagnostic information is  necessary to determine patient infection status.  Positive results do  not rule out bacterial infection or co-infection with other viruses. If result is PRESUMPTIVE POSTIVE SARS-CoV-2 nucleic acids MAY BE PRESENT.   A presumptive positive result was obtained on the submitted specimen  and confirmed on repeat testing.  While 2019 novel coronavirus  (SARS-CoV-2) nucleic acids may be present in the submitted sample  additional confirmatory testing may be necessary for epidemiological  and / or clinical management purposes  to differentiate between  SARS-CoV-2 and other Sarbecovirus currently known to infect humans.  If clinically indicated additional testing with an alternate test  methodology [phone]) is advised. The SARS-CoV-2 RNA is generally  detectable in upper and lower respiratory sp ecimens during the acute  phase of infection. The expected result is Negative. Fact Sheet for Patients:  StrictlyIdeas.no Fact Sheet for Healthcare Providers: BankingDealers.co.za This test is not yet approved or cleared by the [place] FDA and has been authorized for detection and/or diagnosis of SARS-CoV-2 by FDA under an Emergency Use Authorization (EUA).  This EUA will remain in effect (meaning this test can be used) for the duration of the COVID-19 declaration under Section 564(b)(1) of the Act, 21 U.S.C. section 360bbb-3(b)(1), unless the authorization is terminated or revoked sooner. Performed at [facility], [address], [place], [place] [address]   Urine culture     Status: Abnormal   Collection Time: 10/24/2018  6:26 PM   Specimen: In/Out Cath Urine  Result Value Ref Range Status   Specimen Description   Final    IN/OUT CATH URINE Performed at [facility], [address] [place], [place] [address]    Special Requests   Final    NONE Performed at [facility], [address], [place], [place] [address]    Culture (A)  Final    >=100,000 COLONIES/mL MORGANELLA MORGANII >=100,000 COLONIES/mL ESCHERICHIA COLI Confirmed Extended Spectrum Beta-Lactamase Producer (ESBL).  In bloodstream infections from ESBL organisms, carbapenems are preferred over piperacillin/tazobactam. They are shown to have a lower risk of mortality.    Report Status 10/28/2018 FINAL  Final   Organism ID, Bacteria MORGANELLA MORGANII (A)  Final   Organism ID, Bacteria ESCHERICHIA COLI (A)  Final      Susceptibility   Escherichia coli - MIC*    AMPICILLIN >=32 RESISTANT Resistant     CEFAZOLIN >=64 RESISTANT Resistant     CEFTRIAXONE RESISTANT Resistant     CIPROFLOXACIN 2 INTERMEDIATE Intermediate     GENTAMICIN >=16 RESISTANT Resistant     IMIPENEM 1 SENSITIVE Sensitive     NITROFURANTOIN 128 RESISTANT Resistant     TRIMETH/SULFA >=320 RESISTANT Resistant     AMPICILLIN/SULBACTAM >=32 RESISTANT Resistant     PIP/TAZO 32  INTERMEDIATE Intermediate     Extended ESBL POSITIVE Resistant     * >=100,000 COLONIES/mL ESCHERICHIA COLI   Morganella morganii - MIC*    AMPICILLIN >=32 RESISTANT Resistant     CEFAZOLIN >=64 RESISTANT Resistant     CEFTRIAXONE 8 SENSITIVE Sensitive     CIPROFLOXACIN 2 INTERMEDIATE Intermediate     GENTAMICIN >=16 RESISTANT Resistant     IMIPENEM 2 SENSITIVE Sensitive     NITROFURANTOIN 128 RESISTANT Resistant     TRIMETH/SULFA >=320 RESISTANT Resistant     AMPICILLIN/SULBACTAM >=32 RESISTANT Resistant     PIP/TAZO <=4 SENSITIVE Sensitive     * >=100,000 COLONIES/mL MORGANELLA MORGANII  MRSA PCR Screening     Status: None   Collection Time: 11/02/2018 10:31 PM   Specimen: Nasal Mucosa; Nasopharyngeal  Result Value Ref Range Status   MRSA by PCR NEGATIVE NEGATIVE Final    Comment:        The GeneXpert MRSA Assay (FDA approved for NASAL  specimens only), is one component of a comprehensive MRSA colonization surveillance program. It is not intended to diagnose MRSA infection nor to guide or monitor treatment for MRSA infections. Performed at [facility], [address] [place], [place] [address]   Novel Coronavirus, NAA (hospital order; send-out to ref lab)     Status: None   Collection Time: 10/27/18  8:55 PM   Specimen: Nasopharyngeal Swab; Respiratory  Result Value Ref Range Status   SARS-CoV-2, NAA NOT DETECTED NOT DETECTED Final    Comment: (NOTE) This test was developed and its performance characteristics determined by [employer]. This test has not been FDA cleared or approved. This test has been authorized by FDA under an Emergency Use Authorization (EUA). This test is only authorized for the duration of time the declaration that circumstances exist justifying the authorization of the emergency use of in vitro diagnostic tests for detection of SARS-CoV-2 virus and/or diagnosis of COVID-19 infection under section 564(b)(1) of the Act, 21 U.S.C. 287GOT-1(X)(7), unless the authorization is terminated or revoked sooner. When diagnostic testing is negative, the possibility of a false negative result should be considered in the context of a patient's recent exposures and the presence of clinical signs and symptoms consistent with COVID-19. An individual without symptoms of COVID-19 and who is not shedding SARS-CoV-2 virus would expect to have a negative (not detected) result in this assay. Performed  At: [facility] [address] [place], [place] [id number] Rush Farmer MD CB:[id number]    [place]  Final    Comment: Performed at [facility], [address], [place], [place] [address]     Thank you for allowing pharmacy to be a part of this patient's care.  Donna Christen Camar [place] 10/29/2018 10:15 PM

## 2018-10-29 NOTE — Evaluation (Signed)
Modified Barium Swallow Progress Note  Patient Details  Name: Sarah [name] MRN: [id number] Date of Birth: 30-Aug-1965  Today's Date: 10/29/2018  Modified Barium Swallow completed.  Full report located under Chart Review in the Imaging Section.  Brief recommendations include the following:  Clinical Impression  Pt presents with severe sensorimotor oropharyngeal dysphagia characterized by silent aspiration of all textures and consistencies presented noted before, during and after the swallow. Oral phase reveals decreased bolus cohesion and severely decreased base of tongue retraction; decreased oral control resulting in premature spillage and delayed swallow trigger. Severely decreased laryngeal excursion, severely decreased laryngeal vestibule closure and severely decreased pharyngeal squeeze result in severe pharyngeal residue across all consistencies that were not sensed; despite cued throat clears followed by multiple effortful swallows Pt was unable to clear residuals. Effortful swallow and chin tuck maneuvers were not effective in decreasing or eliminating aspiration. Questionably note increased pharyngeal residue on one side; possibly L however it was challenging to visualize. Pt reported previous radiation therapy to the thyroid area which could have possibly have negative impact. [name] Kitchen Despite varying weight and size of bolus no epiglottic deflection was noted. Pt is placed at HIGH aspiration risk with recommendation for NPO and alternative means of nutrition at this time. ST will continue to follow and will also follow for goals of care   Swallow Evaluation Recommendations       SLP Diet Recommendations: NPO       Medication Administration: Via alternative means               Oral Care Recommendations: Oral care QID      Ziya Coonrod H. Roddie Mc, CCC-SLP Speech Language Pathologist   Wende Bushy 10/29/2018,3:16 PM

## 2018-10-29 NOTE — Progress Notes (Signed)
Went into room to assess patient.  Patient had just received breathing treatment.  Oxygen increased from 5l to 8L.  Patient severely hypotensive with systolic bp in the 60.  Rapid response called.  Respiratory arrived to floor and increased oxygen to 15L.  Normal saline bolus started.   MD to floor to assess patient.  [name] severely congested.  [name] alert and responsive. Bolus going through IV, and patient receiving interventions per MD order. Will continue to monitor patient.

## 2018-10-29 NOTE — Care Management Important Message (Signed)
Important Message  Patient Details  Name: Sarah [name] MRN: [id number] Date of Birth: 02/28/1966   Medicare Important Message Given:  Yes(was given to nurse to deliver to patient due to contact precautions)     Tommy Medal 10/29/2018, 1:53 PM

## 2018-10-29 NOTE — Progress Notes (Signed)
Patient currently awaiting bed placement and transfer to [place].

## 2018-10-29 NOTE — Progress Notes (Signed)
TRH night shift coverage note.  The patient was seen due to hypotension, anxiety and dyspnea. Earlier in the night the patient was given Ativan 1 mg IVP due to anxiety with tachypnea. Currently, the patient seems mildly anxious, but otherwise in NAD. She is afebrile, pulse is 82, respiratory rate is 18/min, BP 146/58 mmHg and O2 sat 100% on nasal cannula oxygen.  Lungs: Poor inspiratory effort.  Mild bilateral rhonchi. CV: S1-S2 RRR. Abdomen: Soft, nontender Extremities: No CCE. Neuro: AAO x3, unchanged baseline paraplegia.  Assessment:  Dyspnea Secondary to poor inspiratory effort. Hypotension due to autonomic dysfunction  Plan: Xopenex 1.25 mg increased to every 6 hrs. Ipratropium discontinued to avoid secretions dryness. Mucomyst added to regimen via nebulization. Trial of CPAP at bedtime. NS 500 mL bolus ordered.  Tennis Must, MD.

## 2018-10-29 NOTE — Progress Notes (Signed)
Patient complains of shortness of breath and states "I just can't breathe", O2 sat 98%. [name] in no visible distress. Respiratory at bedside for treatment. Patient refusing all other medications at this time, stating that she can't take the medication because her throat hurts at this time. Will notify MD.

## 2018-10-29 NOTE — Progress Notes (Signed)
Patient dumped out about half of breathing treatment and refused to wear the mask. She had to be coached through the rest of the treatment to hold the mouthpiece and do treatment appropriately. Patient was taking mouthpiece out constantly asking if she could stop the treatment.

## 2018-10-29 NOTE — Progress Notes (Signed)
Patient refused use of Metaneb with treatment. She also states she is not strong enough to cough even with quad cough assistance. Her effort is weak. Patient also refusing to complete nebulizer treatments. [name] Kitchen

## 2018-10-29 NOTE — Progress Notes (Signed)
Patient mother given update on patient condition and transfer to [place].  All questions answered and mother verbalized understanding.

## 2018-10-29 NOTE — Progress Notes (Signed)
Sarah [name] Spoke with patients mother and updated her that we were transferring patient to [facility] ICU and that patient was critically ill at this time, and prognosis guarded.

## 2018-10-29 NOTE — Progress Notes (Addendum)
Xcover Per RN requiring increased o2,  5L -8L,  Pt having difficulty with breathing, pt has ineffective cough.  Pt afebrile  Pt aspirated w swallow study.   Exam:  P 82 Bp 70/45  Pox 97% Gen axox3 Heent: anicteric Neck: no jvd Heart: rrr s1, s2,  Lung: + bilateral rhonchi, no wheezing Abd: soft, nt, nd +bs Ext: no c/c/e  10/27/2018 procalcitonin 6.26 Wbc 11.2, hgb 7.2 Plt 188 Na 140, K 3.4, Bun 24, Creatinine 1.02   10/28/2018 procalcitonin 4.7   A/P Acute respiratory failure Secondary to aspiration pneumonia CXR portable STAT Cont Meropenem Add vanco iv pharmacy to dose Cont diflucan   Hypotension secondary to sepsis? Sepsis Blood culture x2 Lactic acid Cortisol 12 lead ekg Trop I q2h x2, cbc, cmp Cardiac echo Ns 1L iv x1 bolus  Anemia Check cbc  Discussed case with Dr. Jimmy Footman Transfer to [facility] to ICU for sepsis and hypotension  Confirmed full code with patient  Critical care 40 minutes

## 2018-10-29 NOTE — Progress Notes (Addendum)
MD assessed patient at bedside. pts blood pressure was 80/48 manually. MD placed order for one time iv bolus of normal saline. MD placed order for CPAP at night . Will continue to monitor throughout shift.

## 2018-10-29 NOTE — Progress Notes (Signed)
Respiratory came to floor to place cpap on patient. Patient refused to wear cpap.patient sleeping in bed at this time on high flow nasal cannula at 6 liters. Will make MD aware. Will continue to monitor throughout shift.

## 2018-10-29 NOTE — Progress Notes (Signed)
ANTICOAGULATION CONSULT NOTE - Initial Consult  Pharmacy Consult for Lovenox Indication: history of VTE  Allergies  Allergen Reactions  . Bee Venom Anaphylaxis  . Heparin Other (See Comments)    Weak positive platelets induced antibodies, SRA negative--2015  . Keflex [Cephalexin] Nausea And Vomiting  . Tramadol Nausea And Vomiting  . Gabapentin Nausea And Vomiting  . Hydrochlorothiazide Nausea And Vomiting  . Hydrocodone Nausea And Vomiting  . Oxycodone-Acetaminophen Nausea And Vomiting  . Sorbitol Nausea Only    And abdominal distress  . Tizanidine     Fatigue     Patient Measurements: Height: 5\' 11"  (180.3 cm) Weight: 192 lb 0.3 oz (87.1 kg) IBW/kg (Calculated) : 70.8   Vital Signs: Temp: 96.9 F (36.1 C) (08/07 1410) Temp Source: Axillary (08/07 1410) BP: 105/64 (08/07 1410) Pulse Rate: 73 (08/07 1410)  Labs: Recent Labs    10/27/18 0546 10/29/18 0500  HGB 7.2*  --   HCT 24.4*  --   PLT 188  --   CREATININE 1.02* 0.90    Estimated Creatinine Clearance: 89.2 mL/min (by C-G formula based on SCr of 0.9 mg/dL).   Medical History: Past Medical History:  Diagnosis Date  . Anxiety   . 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 Chronic back pain   . Complication of anesthesia    Nausea and vomiting  . DDD (degenerative disc disease), cervical   . Depression   . DJD (degenerative joint disease)   . Family history of adverse reaction to anesthesia    MOTHER HAS NAUSEA   . GERD (gastroesophageal reflux disease)   . History of blood transfusion    08/2017  . History of rectal fissure   . HIT (heparin-induced thrombocytopenia) ([place])   . Hypertension    05/27/2018- not current- runs low  . Hypothyroidism   . Obesity   . Paraplegia at T4 level ([place])   . Pneumonia 03/2018  . Pulmonary embolus, right ([place]) 2015  . Rotator cuff tendonitis right    Medications:  Medications Prior to Admission  Medication Sig Dispense  Refill Last Dose  . acetaminophen (TYLENOL) 650 MG CR tablet Take 1,300 mg by mouth every 6 (six) hours as needed for pain.   unknown  . Amino Acids-Protein Hydrolys (PRO-STAT PO) Take 30 mLs by mouth 2 (two) times daily.   10/23/2018 at Unknown time  . baclofen (LIORESAL) 20 MG tablet Take 1 tablet (20 mg total) by mouth 3 (three) times daily. 30 each 0 11/03/2018 at Unknown time  . bisacodyl (DULCOLAX) 10 MG suppository Place 1 suppository (10 mg total) rectally daily. (Patient taking differently: Place 10 mg rectally daily as needed for mild constipation or moderate constipation. ) 12 suppository 0 unknown  . diclofenac sodium (VOLTAREN) 1 % GEL Apply 2 g topically 3 (three) times daily. (Patient taking differently: Apply 2 g topically 2 (two) times daily. Left shoulder)   10/28/2018 at Unknown time  . ferrous sulfate 325 (65 FE) MG tablet Take 1 tablet (325 mg total) by mouth every other day.  3 10/24/2018 at Unknown time  . fludrocortisone (FLORINEF) 0.1 MG tablet Take 1 tablet (0.1 mg total) by mouth daily.   11/09/2018 at Unknown time  . fluticasone (FLONASE) 50 MCG/ACT nasal spray Place 1 spray into both nostrils daily.   11/07/2018 at Unknown time  . guaifenesin (HUMIBID E) 400 MG TABS tablet Take 400 mg by mouth 3 (three) times daily. 14 day course  starting on 10/24/2018   11/03/2018 at Unknown time  . ipratropium-albuterol (DUONEB) 0.5-2.5 (3) MG/3ML SOLN Take 3 mLs by nebulization every 8 (eight) hours. *May inhale every 6 hours as needed for shortness of breath/wheezing   11/17/2018 at Unknown time  . levothyroxine (SYNTHROID) 150 MCG tablet Take 1 tablet (150 mcg total) by mouth daily before breakfast. 30 tablet 1 11/14/2018 at Unknown time  . loratadine (CLARITIN) 10 MG tablet Take 10 mg by mouth daily.   11/08/2018 at Unknown time  . Multiple Vitamin (MULTIVITAMIN WITH MINERALS) TABS tablet Take 1 tablet by mouth daily.   11/07/2018 at Unknown time  . ondansetron (ZOFRAN) 4 MG tablet Take 4 mg by mouth every 6  (six) hours as needed for nausea or vomiting.   11/01/2018 at 1204  . oxyCODONE (ROXICODONE) 5 MG immediate release tablet Take 1 tablet (5 mg total) by mouth every 6 (six) hours as needed for severe pain. 6 tablet 0 11/01/2018 at 1345  . pantoprazole (PROTONIX) 40 MG tablet Take 1 tablet (40 mg total) by mouth daily. (Patient taking differently: Take 40 mg by mouth daily. ) 30 tablet 0 11/02/2018 at Unknown time  . polyethylene glycol (MIRALAX / GLYCOLAX) 17 g packet Take 17 g by mouth daily. 14 each 0 11/06/2018 at Unknown time  . rivaroxaban (XARELTO) 20 MG TABS tablet Take 1 tablet (20 mg total) by mouth daily with supper. (Patient taking differently: Take 20 mg by mouth every morning. ) 30 tablet 3 11/12/2018 at 900  . sertraline (ZOLOFT) 50 MG tablet Take 50 mg by mouth daily.    11/06/2018 at Unknown time    Assessment: Pharmacy consulted to dose lovenox in a patient on Xarelto prior to admission and currently NPO.  Patient's last dose was 8/5 1645.  Patient has listed allergy to heparin but SRA was negative so did not confirm HIT diagnosis.   Goal of Therapy:   Monitor platelets by anticoagulation protocol: Yes   Plan:  Lovenox 80 mg subq every 12 hours. Monitor labs and s/s of bleeding.  Ramond Craver 10/29/2018,3:04 PM

## 2018-10-29 NOTE — Progress Notes (Signed)
[name] resting in the bed with her mother at the bedside. Patient states that she does not feel like she can make it through the night and through tomorrow.Patient states that she has talked to the doctor and that she wants to go on the ventilator. MD is paged to make aware. [name] resting with no distress at this time.

## 2018-10-29 NOTE — Progress Notes (Signed)
Patient refused Metaneb with breathing treatment.

## 2018-10-29 NOTE — Progress Notes (Signed)
Report called to RT at [facility] that will be receiving patient.

## 2018-10-29 NOTE — Progress Notes (Signed)
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 paraplegia and sacral decubitus ulcer presenting with one week of sob from [place] that worsened over 24 hours prior to admission.  Apparently, the patient had been diagnosed with pneumonia on 11/23/98 at [facility], but had not yet been started on antibiotics.  At baseline she is nonambulatory and transfers with sliding board.  The patient has had numerous recent hospitalizations including from 08/09/2018 to 08/17/2018 during which time she was admitted for an infected sacral decubitus ulcer.  She had a debridement on 08/10/2018 by general surgery and was discharged to finish a short course of clindamycin.  CT of the pelvis was negative for osteomyelitis at that time.  She subsequently stayed at [facility] from 08/16/2020 of 09/03/2018 after which she was discharged to [facility].  The patient had complained of some subjective fever and chills, but denied any vomiting, diarrhea, abdominal pain, headache, neck pain, hemoptysis.  In the emergency department, the patient was afebrile with soft blood pressures in the 90s.  Oxygen saturation was initially 87% on 2 L, and improved to 94% on 4 L.  Chest x-ray revealed increasing bibasilar opacities concerning for pneumonia.  As result, the patient was started on vancomycin and cefepime and admitted for further treatment and evaluation.  Assessment/Plan: Acute respiratory failure with hypoxia -Secondary to pneumonia -Presently stable on 6 L -Wean oxygen for saturation greater than 92% -SARS-CoV2 neg  HCAP/aspiration PNA -MRSA PCR neg -Vancomycin discontinued on 8/4; zosyn discontinued on 8/6 -patient is  afebrile -Requiring higher level of O2 supplementation and demonstrating Very poor cough, increase secretion build up and diffuse rhonchi on exam; continue chest physiotherapy and low dose steroids  -start Mucomyst nebulizer -pulmonary service consulted -continue current antibiotics.   ESBL E. coli and Morganella UTI -> 100,000 colonies present -denies dysuria, but with chronic paraplegia not reliable symptoms. -Antibiotic has been adjusted to meropenem following sensitivity. -She is chronically dependent to indwelling catheter secondary to neurogenic bladder and will meet criteria for catheter associated UTI.  Hypotension -This has been a chronic issue for the patient with labile blood pressures -Likely due to the patient's dysautonomia from her spinal cord metastasis/paraplegia -Continue Florinef, dose adjusted to BID. -continue supportive care  myelopathy and paraplegia -secondary to metastatic Hurthle cancerto the thoracic spine status post recurrence of T3/T4 spinal tumor with resection June 2019 -Continue baclofen -Patient has neurogenic bladder and bowel  Stage IV sacral decubitus ulcer -Personally examined wound-->100% granulation without signs of superimposed infection. -Present on admission -continue daily wet-to-dry dressings and prevention measures.  Transaminasemia -likely due to hepatic congestion from infx -RUQ US demonstrating hepatic steatosis no focal liver lesions are evident on this study, but [place] sensitivity is diminished for the circumstances.  Hurthle Cell Carcinoma -continue outpatient follow up with [name]  Hypothyroidism -Continue Synthroid, for now changed to IV.  History of pulmonary embolus -will start lovenox per pharmacy  -hold xarelto while unable to take PO's -No signs of overt bleeding.  Depression/anxiety -Continue Zoloft when tolerating PO's -will use PRN ativan now.  Anemia of chronic disease -Baseline hemoglobin~8 -Continue iron  supplementation  Dysphagia -Appreciate evaluation by speech therapy -Follow  recommendation we will keep patient n.p.o. -She has failed MBS bilaterally at this time.   Disposition Plan:   SNF once medically stable.  At this moment patient with worsening respiratory status, increase congestion and secretion buildup with a significant high risk for aspiration.  Following the speech therapy recommendations have been made n.p.o.; pulmonology service has been consulted.  Start Mucomyst nebulization and empirical treatment for esophageal candidiasis.   Family Communication:   No Family at bedside  Consultants:  Palliative Care  Code Status:  FULL   DVT Prophylaxis:  Xarelto  Consultants: Pulmonology service  Procedures: Below for x-ray reports  Antibiotics: Zosyn 8/3>>>8/6 vanco 8/3>>8/4 Meropenem 8/6>>>>   Subjective: No fever, no headaches, no chest pain.  Patient reporting severe sore throat, increase difficulty breathing and inability to swallow.  Increasing her oxygen supplementation needed overnight and also soft blood pressure appreciated on exam.  She expressed feeling very anxious.  Objective: Vitals:   10/29/18 0600 10/29/18 0622 10/29/18 0917 10/29/18 1215  BP:  (!) 87/60    Pulse:  73    Resp:      Temp: (!) 97 F (36.1 C)     TempSrc: Axillary     SpO2:   97% 95%  Weight:      Height:        Intake/Output Summary (Last 24 hours) at 10/29/2018 1411 Last data filed at 10/29/2018 1300 Gross per 24 hour  Intake 3322.01 ml  Output -  Net 3322.01 ml   Weight change:   Exam: General exam: Alert, awake, oriented x 3; complaining of increased difficulty breathing, inability to swallow and severe sore throat.  [name] afebrile.  Requiring 6 L of oxygen supplementation at this time. Respiratory system: Diffuse rhonchi, mild tachypnea, no wheezing on exam currently. Cardiovascular system:RRR. No murmurs, rubs, gallops. Gastrointestinal system: Abdomen is  nondistended, soft and nontender. No organomegaly or masses felt. Normal bowel sounds heard. Central nervous system: Alert and oriented. No new focal neurological deficits. Extremities: No cyanosis or clubbing.  Trace edema bilaterally appreciated on her lower extremities. Skin: No rashes, no petechiae.  Unchanged stage IV bilateral ischial ulcers with no signs of superimposed infection or active drainage.  Granulation tissue and appreciated.  Patient's ulcers has been present prior to admission. Psychiatry: Judgement and insight appear normal.  Flat affect on exam, expressed to be anxious..   Data Reviewed: I have personally reviewed following labs and imaging studies  Basic Metabolic Panel: Recent Labs  Lab 10/24/2018 1656 10/26/18 0607 10/27/18 0546 10/29/18 0500  NA 141 141 140  --   K 3.7 4.1 3.4*  --   CL 106 104 105  --   CO2 23 19* 24  --   GLUCOSE 73 135* 88  --   BUN 18 22* 24*  --   CREATININE 0.86 1.05* 1.02* 0.90  CALCIUM 7.6* 7.7* 7.4*  --    Liver Function Tests: Recent Labs  Lab 11/14/2018 1656 10/26/18 0607 10/27/18 0546  AST 130* 92* 54*  ALT 31 27 22   ALKPHOS 178* 156* 122  BILITOT 1.0 1.9* 1.2  PROT 6.0* 5.7* 5.2*  ALBUMIN 2.3* 2.2* 1.9*   Coagulation Profile: Recent Labs  Lab 11/20/2018 1656  INR 2.5*   CBC: Recent Labs  Lab 11/14/2018 1656 10/26/18 0607 10/27/18 0546  WBC 6.9 12.7* 11.2*  NEUTROABS 6.0  --  10.4*  HGB 8.3* 8.0* 7.2*  HCT 28.5* 27.4* 24.4*  MCV 100.0 99.6 97.6  PLT 199 201 188  Urine analysis:    Component Value Date/Time   COLORURINE YELLOW 11/21/2018 1826   APPEARANCEUR HAZY (A) 11/06/2018 1826   LABSPEC 1.013 10/29/2018 1826   PHURINE 6.0 10/29/2018 1826   GLUCOSEU NEGATIVE 10/27/2018 1826   HGBUR NEGATIVE 11/09/2018 1826   BILIRUBINUR NEGATIVE 10/28/2018 1826   BILIRUBINUR MODERATE 03/03/2014 1028   KETONESUR 20 (A) 11/16/2018 1826   PROTEINUR NEGATIVE 11/19/2018 1826   UROBILINOGEN 1.0 03/11/2014 2130    NITRITE POSITIVE (A) 11/13/2018 1826   LEUKOCYTESUR MODERATE (A) 10/31/2018 1826    Recent Results (from the past 240 hour(s))  Blood Culture (routine x 2)     Status: None (Preliminary result)   Collection Time: 11/05/2018  4:56 PM   Specimen: Right Antecubital; Blood  Result Value Ref Range Status   Specimen Description   Final    RIGHT ANTECUBITAL BOTTLES DRAWN AEROBIC AND ANAEROBIC   Special Requests   Final    Blood Culture results may not be optimal due to an excessive volume of blood received in culture bottles   Culture   Final    NO GROWTH 4 DAYS Performed at [facility], [address] [place], [place] [address]    Report Status PENDING  Incomplete  Blood Culture (routine x 2)     Status: None (Preliminary result)   Collection Time: 11/14/2018  5:06 PM   Specimen: Left Antecubital; Blood  Result Value Ref Range Status   Specimen Description   Final    LEFT ANTECUBITAL BOTTLES DRAWN AEROBIC AND ANAEROBIC   Special Requests Blood Culture adequate volume  Final   Culture   Final    NO GROWTH 4 DAYS Performed at [facility], [address] [place], [place] [address]    Report Status PENDING  Incomplete  SARS Coronavirus 2 [facility] order, Performed in [name] hospital lab) Nasopharyngeal Nasopharyngeal Swab     Status: None   Collection Time: 10/24/2018  5:36 PM   Specimen: Nasopharyngeal Swab  Result Value Ref Range Status   SARS Coronavirus 2 NEGATIVE NEGATIVE Final    Comment: (NOTE) If result is NEGATIVE SARS-CoV-2 target nucleic acids are NOT DETECTED. The SARS-CoV-2 RNA is generally detectable in upper and lower  respiratory specimens during the acute phase of infection. The lowest  concentration of SARS-CoV-2 viral copies this assay can detect is 250  copies / mL. A negative result does not preclude SARS-CoV-2 infection  and should not be used as the sole basis for treatment or other  patient management decisions.  A negative result may occur with  improper  specimen collection / handling, submission of specimen other  than nasopharyngeal swab, presence of viral mutation(s) within the  areas targeted by this assay, and inadequate number of viral copies  (<250 copies / mL). A negative result must be combined with clinical  observations, patient history, and epidemiological information. If result is POSITIVE SARS-CoV-2 target nucleic acids are DETECTED. The SARS-CoV-2 RNA is generally detectable in upper and lower  respiratory specimens dur ing the acute phase of infection.  Positive  results are indicative of active infection with SARS-CoV-2.  Clinical  correlation with patient history and other diagnostic information is  necessary to determine patient infection status.  Positive results do  not rule out bacterial infection or co-infection with other viruses. If result is PRESUMPTIVE POSTIVE SARS-CoV-2 nucleic acids MAY BE PRESENT.   A presumptive positive result was obtained on the submitted specimen  and confirmed on repeat testing.  While 2019 novel  coronavirus  (SARS-CoV-2) nucleic acids may be present in the submitted sample  additional confirmatory testing may be necessary for epidemiological  and / or clinical management purposes  to differentiate between  SARS-CoV-2 and other Sarbecovirus currently known to infect humans.  If clinically indicated additional testing with an alternate test  methodology [phone]) is advised. The SARS-CoV-2 RNA is generally  detectable in upper and lower respiratory sp ecimens during the acute  phase of infection. The expected result is Negative. Fact Sheet for Patients:  StrictlyIdeas.no Fact Sheet for Healthcare Providers: BankingDealers.co.za This test is not yet approved or cleared by the [place] FDA and has been authorized for detection and/or diagnosis of SARS-CoV-2 by FDA under an Emergency Use Authorization (EUA).  This EUA will remain in  effect (meaning this test can be used) for the duration of the COVID-19 declaration under Section 564(b)(1) of the Act, 21 U.S.C. section 360bbb-3(b)(1), unless the authorization is terminated or revoked sooner. Performed at [facility], [address], [place], [place] [address]   Urine culture     Status: Abnormal   Collection Time: 10/27/2018  6:26 PM   Specimen: In/Out Cath Urine  Result Value Ref Range Status   Specimen Description   Final    IN/OUT CATH URINE Performed at [facility], [address], [place], [place] [address]    Special Requests   Final    NONE Performed at [facility], [address], [place], [place] [address]    Culture (A)  Final    >=100,000 COLONIES/mL MORGANELLA MORGANII >=100,000 COLONIES/mL ESCHERICHIA COLI Confirmed Extended Spectrum Beta-Lactamase Producer (ESBL).  In bloodstream infections from ESBL organisms, carbapenems are preferred over piperacillin/tazobactam. They are shown to have a lower risk of mortality.    Report Status 10/28/2018 FINAL  Final   Organism ID, Bacteria MORGANELLA MORGANII (A)  Final   Organism ID, Bacteria ESCHERICHIA COLI (A)  Final      Susceptibility   Escherichia coli - MIC*    AMPICILLIN >=32 RESISTANT Resistant     CEFAZOLIN >=64 RESISTANT Resistant     CEFTRIAXONE RESISTANT Resistant     CIPROFLOXACIN 2 INTERMEDIATE Intermediate     GENTAMICIN >=16 RESISTANT Resistant     IMIPENEM 1 SENSITIVE Sensitive     NITROFURANTOIN 128 RESISTANT Resistant     TRIMETH/SULFA >=320 RESISTANT Resistant     AMPICILLIN/SULBACTAM >=32 RESISTANT Resistant     PIP/TAZO 32 INTERMEDIATE Intermediate     Extended ESBL POSITIVE Resistant     * >=100,000 COLONIES/mL ESCHERICHIA COLI   Morganella morganii - MIC*    AMPICILLIN >=32 RESISTANT Resistant     CEFAZOLIN >=64 RESISTANT Resistant     CEFTRIAXONE 8 SENSITIVE Sensitive     CIPROFLOXACIN 2 INTERMEDIATE Intermediate     GENTAMICIN >=16 RESISTANT Resistant     IMIPENEM 2  SENSITIVE Sensitive     NITROFURANTOIN 128 RESISTANT Resistant     TRIMETH/SULFA >=320 RESISTANT Resistant     AMPICILLIN/SULBACTAM >=32 RESISTANT Resistant     PIP/TAZO <=4 SENSITIVE Sensitive     * >=100,000 COLONIES/mL MORGANELLA MORGANII  MRSA PCR Screening     Status: None   Collection Time: 11/15/2018 10:31 PM   Specimen: Nasal Mucosa; Nasopharyngeal  Result Value Ref Range Status   MRSA by PCR NEGATIVE NEGATIVE Final    Comment:        The GeneXpert MRSA Assay (FDA approved for NASAL specimens only), is one component of a comprehensive MRSA colonization surveillance program. It is  not intended to diagnose MRSA infection nor to guide or monitor treatment for MRSA infections. Performed at [facility], [address] [place], [place] [address]      Scheduled Meds: . acetylcysteine  4 mL Nebulization Q6H  . baclofen  20 mg Oral TID  . bisacodyl  10 mg Rectal Daily  . feeding supplement (ENSURE ENLIVE)  237 mL Oral BID BM  . ferrous sulfate  325 mg Oral Q48H  . fludrocortisone  0.1 mg Oral BID  . fluticasone  1 spray Each Nare Daily  . guaiFENesin  600 mg Oral BID  . levalbuterol  1.25 mg Nebulization Q6H  . levothyroxine  150 mcg Oral Q0600  . loratadine  10 mg Oral Daily  . methylPREDNISolone (SOLU-MEDROL) injection  20 mg Intravenous Q12H  . multivitamin with minerals  1 tablet Oral Daily  . nutrition supplement (JUVEN)  1 packet Oral BID BM  . pantoprazole  40 mg Oral Daily  . polyethylene glycol  17 g Oral Daily  . rivaroxaban  20 mg Oral Q supper  . sertraline  50 mg Oral Daily  . sodium chloride flush  3 mL Intravenous Q12H   Continuous Infusions: . sodium chloride    . sodium chloride 50 mL/hr at 10/29/18 1339  . fluconazole (DIFLUCAN) IV    . meropenem (MERREM) IV 1 g (10/29/18 0915)    Procedures/Studies: Dg Chest Port 1 View  Result Date: 11/03/2018 CLINICAL DATA:  Shortness of breath, diagnosed with pneumonia 1 day prior EXAM: PORTABLE CHEST 1 VIEW  COMPARISON:  Radiograph Aug 09, 2018 FINDINGS: Bilateral airspace disease most pronounced at the left lung base with associated volume loss. No pneumothorax. No effusion. Cardiomediastinal contours are unremarkable portable technique and hypoventilatory changes. Degenerative changes are present in the spine. Cervicothoracic fusion hardware is partially included in the imaging without visible complication. No acute osseous or soft tissue abnormality. IMPRESSION: Bilateral airspace disease compatible with multifocal pneumonia. Electronically Signed   By: Lovena Le M.D.   On: 10/29/2018 17:35   US Abdomen Limited Ruq  Result Date: 10/26/2018 CLINICAL DATA:  Elevated liver enzymes EXAM: ULTRASOUND ABDOMEN LIMITED RIGHT UPPER QUADRANT COMPARISON:  None. FINDINGS: Gallbladder: Surgically absent. Common bile duct: Diameter: 6 mm. No intrahepatic or extrahepatic biliary duct dilatation. Liver: No focal lesion identified. Liver echogenicity is increased diffusely. Portal vein is patent on color Doppler imaging with normal direction of blood flow towards the liver. Other: Small right pleural effusion noted. IMPRESSION: 1. Increase in liver echogenicity, a finding indicative of hepatic steatosis. While no focal liver lesions are evident on this study, it must be cautioned that the sensitivity of ultrasound for detection of focal liver lesions is diminished in this circumstance. 2.  Gallbladder absent. 3.  Small right pleural effusion. Electronically Signed   By: Lowella Grip III M.D.   On: 10/26/2018 14:35    Barton Dubois, MD  [facility] Pager [phone]  10/29/2018, 2:11 PM   LOS: 4 days

## 2018-10-29 NOTE — Progress Notes (Signed)
Report given to carelink and to receiving nurse.  MD made aware of patient potassium.  Potassium unable to be given at this time due to patient transfer and unable to secure second iv access.   MD also made of aware of patient EKG.  Patient left with carelink in guarded condition.

## 2018-10-29 NOTE — Progress Notes (Signed)
Attempted Mucomyst and Xopenex treatment on patient. Patient refused to wear the mask and then had to be coached the entire treatment to leave the mouthpiece in her mouth and breath medicine in appropriately. Patient still states she cannot breath. Assisted patient with 3 quad coughs, patient still has ineffective cough and poor effort. [name] breathing 16 times a minute with O2 sat of 98%. Patient requests to see MD and talk to him. RN made aware.

## 2018-10-30 ENCOUNTER — Inpatient Hospital Stay (HOSPITAL_COMMUNITY): Payer: Medicare Other

## 2018-10-30 ENCOUNTER — Inpatient Hospital Stay: Payer: Self-pay

## 2018-10-30 DIAGNOSIS — J9620 Acute and chronic respiratory failure, unspecified whether with hypoxia or hypercapnia: Secondary | ICD-10-CM | POA: Diagnosis present

## 2018-10-30 DIAGNOSIS — J9621 Acute and chronic respiratory failure with hypoxia: Secondary | ICD-10-CM

## 2018-10-30 DIAGNOSIS — J9622 Acute and chronic respiratory failure with hypercapnia: Secondary | ICD-10-CM

## 2018-10-30 DIAGNOSIS — J690 Pneumonitis due to inhalation of food and vomit: Secondary | ICD-10-CM

## 2018-10-30 DIAGNOSIS — Z515 Encounter for palliative care: Secondary | ICD-10-CM

## 2018-10-30 DIAGNOSIS — J9601 Acute respiratory failure with hypoxia: Secondary | ICD-10-CM

## 2018-10-30 DIAGNOSIS — Z7189 Other specified counseling: Secondary | ICD-10-CM

## 2018-10-30 DIAGNOSIS — T17908A Unspecified foreign body in respiratory tract, part unspecified causing other injury, initial encounter: Secondary | ICD-10-CM

## 2018-10-30 DIAGNOSIS — J181 Lobar pneumonia, unspecified organism: Secondary | ICD-10-CM

## 2018-10-30 DIAGNOSIS — A419 Sepsis, unspecified organism: Secondary | ICD-10-CM

## 2018-10-30 DIAGNOSIS — R6521 Severe sepsis with septic shock: Secondary | ICD-10-CM

## 2018-10-30 DIAGNOSIS — J189 Pneumonia, unspecified organism: Principal | ICD-10-CM

## 2018-10-30 DIAGNOSIS — I361 Nonrheumatic tricuspid (valve) insufficiency: Secondary | ICD-10-CM

## 2018-10-30 DIAGNOSIS — G9340 Encephalopathy, unspecified: Secondary | ICD-10-CM

## 2018-10-30 LAB — COMPREHENSIVE METABOLIC PANEL
ALT: 22 U/L (ref 0–44)
AST: 29 U/L (ref 15–41)
Albumin: 1.6 g/dL — ABNORMAL LOW (ref 3.5–5.0)
Alkaline Phosphatase: 114 U/L (ref 38–126)
Anion gap: 17 — ABNORMAL HIGH (ref 5–15)
BUN: 27 mg/dL — ABNORMAL HIGH (ref 6–20)
CO2: 16 mmol/L — ABNORMAL LOW (ref 22–32)
Calcium: 7.8 mg/dL — ABNORMAL LOW (ref 8.9–10.3)
Chloride: 111 mmol/L (ref 98–111)
Creatinine, Ser: 1.43 mg/dL — ABNORMAL HIGH (ref 0.44–1.00)
GFR calc Af Amer: 49 mL/min — ABNORMAL LOW (ref >60)
GFR calc non Af Amer: 42 mL/min — ABNORMAL LOW (ref >60)
Glucose, Bld: 117 mg/dL — ABNORMAL HIGH (ref 70–99)
Potassium: 2.6 mmol/L — CL (ref 3.5–5.1)
Sodium: 144 mmol/L (ref 135–145)
Total Bilirubin: 1.7 mg/dL — ABNORMAL HIGH (ref 0.3–1.2)
Total Protein: 5.3 g/dL — ABNORMAL LOW (ref 6.5–8.1)

## 2018-10-30 LAB — BASIC METABOLIC PANEL
Anion gap: 19 — ABNORMAL HIGH (ref 5–15)
Anion gap: 18 — ABNORMAL HIGH (ref 5–15)
Anion gap: 13 (ref 5–15)
BUN: 30 mg/dL — ABNORMAL HIGH (ref 6–20)
BUN: 31 mg/dL — ABNORMAL HIGH (ref 6–20)
BUN: 31 mg/dL — ABNORMAL HIGH (ref 6–20)
CO2: 16 mmol/L — ABNORMAL LOW (ref 22–32)
CO2: 16 mmol/L — ABNORMAL LOW (ref 22–32)
CO2: 23 mmol/L (ref 22–32)
Calcium: 7.9 mg/dL — ABNORMAL LOW (ref 8.9–10.3)
Calcium: 7.4 mg/dL — ABNORMAL LOW (ref 8.9–10.3)
Calcium: 7 mg/dL — ABNORMAL LOW (ref 8.9–10.3)
Chloride: 112 mmol/L — ABNORMAL HIGH (ref 98–111)
Chloride: 113 mmol/L — ABNORMAL HIGH (ref 98–111)
Chloride: 111 mmol/L (ref 98–111)
Creatinine, Ser: 1.41 mg/dL — ABNORMAL HIGH (ref 0.44–1.00)
Creatinine, Ser: 1.5 mg/dL — ABNORMAL HIGH (ref 0.44–1.00)
Creatinine, Ser: 1.31 mg/dL — ABNORMAL HIGH (ref 0.44–1.00)
GFR calc Af Amer: 50 mL/min — ABNORMAL LOW (ref >60)
GFR calc Af Amer: 46 mL/min — ABNORMAL LOW (ref >60)
GFR calc Af Amer: 54 mL/min — ABNORMAL LOW (ref >60)
GFR calc non Af Amer: 43 mL/min — ABNORMAL LOW (ref >60)
GFR calc non Af Amer: 40 mL/min — ABNORMAL LOW (ref >60)
GFR calc non Af Amer: 47 mL/min — ABNORMAL LOW (ref >60)
Glucose, Bld: 119 mg/dL — ABNORMAL HIGH (ref 70–99)
Glucose, Bld: 137 mg/dL — ABNORMAL HIGH (ref 70–99)
Glucose, Bld: 254 mg/dL — ABNORMAL HIGH (ref 70–99)
Potassium: 3 mmol/L — ABNORMAL LOW (ref 3.5–5.1)
Potassium: 2.4 mmol/L — CL (ref 3.5–5.1)
Potassium: 3.3 mmol/L — ABNORMAL LOW (ref 3.5–5.1)
Sodium: 147 mmol/L — ABNORMAL HIGH (ref 135–145)
Sodium: 147 mmol/L — ABNORMAL HIGH (ref 135–145)
Sodium: 147 mmol/L — ABNORMAL HIGH (ref 135–145)

## 2018-10-30 LAB — POCT I-STAT 7, (LYTES, BLD GAS, ICA,H+H)
Acid-base deficit: 10 mmol/L — ABNORMAL HIGH (ref 0.0–2.0)
Acid-base deficit: 12 mmol/L — ABNORMAL HIGH (ref 0.0–2.0)
Acid-base deficit: 8 mmol/L — ABNORMAL HIGH (ref 0.0–2.0)
Bicarbonate: 18.3 mmol/L — ABNORMAL LOW (ref 20.0–28.0)
Bicarbonate: 16.3 mmol/L — ABNORMAL LOW (ref 20.0–28.0)
Bicarbonate: 16.5 mmol/L — ABNORMAL LOW (ref 20.0–28.0)
Calcium, Ion: 1.24 mmol/L (ref 1.15–1.40)
Calcium, Ion: 1.22 mmol/L (ref 1.15–1.40)
Calcium, Ion: 1.13 mmol/L — ABNORMAL LOW (ref 1.15–1.40)
HCT: 30 % — ABNORMAL LOW (ref 36.0–46.0)
HCT: 29 % — ABNORMAL LOW (ref 36.0–46.0)
HCT: 26 % — ABNORMAL LOW (ref 36.0–46.0)
Hemoglobin: 10.2 g/dL — ABNORMAL LOW (ref 12.0–15.0)
Hemoglobin: 9.9 g/dL — ABNORMAL LOW (ref 12.0–15.0)
Hemoglobin: 8.8 g/dL — ABNORMAL LOW (ref 12.0–15.0)
O2 Saturation: 99 %
O2 Saturation: 93 %
O2 Saturation: 99 %
Patient temperature: 96.9
Patient temperature: 96.9
Patient temperature: 92.3
Potassium: 2.5 mmol/L — CL (ref 3.5–5.1)
Potassium: 2.9 mmol/L — ABNORMAL LOW (ref 3.5–5.1)
Potassium: 2.6 mmol/L — CL (ref 3.5–5.1)
Sodium: 146 mmol/L — ABNORMAL HIGH (ref 135–145)
Sodium: 145 mmol/L (ref 135–145)
Sodium: 147 mmol/L — ABNORMAL HIGH (ref 135–145)
TCO2: 20 mmol/L — ABNORMAL LOW (ref 22–32)
TCO2: 18 mmol/L — ABNORMAL LOW (ref 22–32)
TCO2: 17 mmol/L — ABNORMAL LOW (ref 22–32)
pCO2 arterial: 47.8 mmHg (ref 32.0–48.0)
pCO2 arterial: 43 mmHg (ref 32.0–48.0)
pCO2 arterial: 24.1 mmHg — ABNORMAL LOW (ref 32.0–48.0)
pH, Arterial: 7.187 — CL (ref 7.350–7.450)
pH, Arterial: 7.18 — CL (ref 7.350–7.450)
pH, Arterial: 7.426 (ref 7.350–7.450)
pO2, Arterial: 139 mmHg — ABNORMAL HIGH (ref 83.0–108.0)
pO2, Arterial: 81 mmHg — ABNORMAL LOW (ref 83.0–108.0)
pO2, Arterial: 110 mmHg — ABNORMAL HIGH (ref 83.0–108.0)

## 2018-10-30 LAB — LACTIC ACID, PLASMA
Lactic Acid, Venous: 1.2 mmol/L (ref 0.5–1.9)
Lactic Acid, Venous: 1.5 mmol/L (ref 0.5–1.9)

## 2018-10-30 LAB — CBC
HCT: 32.5 % — ABNORMAL LOW (ref 36.0–46.0)
Hemoglobin: 9.7 g/dL — ABNORMAL LOW (ref 12.0–15.0)
MCH: 29.8 pg (ref 26.0–34.0)
MCHC: 29.8 g/dL — ABNORMAL LOW (ref 30.0–36.0)
MCV: 100 fL (ref 80.0–100.0)
Platelets: 364 10*3/uL (ref 150–400)
RBC: 3.25 MIL/uL — ABNORMAL LOW (ref 3.87–5.11)
RDW: 20 % — ABNORMAL HIGH (ref 11.5–15.5)
WBC: 26.9 10*3/uL — ABNORMAL HIGH (ref 4.0–10.5)
nRBC: 0.2 % (ref 0.0–0.2)

## 2018-10-30 LAB — GLUCOSE, CAPILLARY
Glucose-Capillary: 120 mg/dL — ABNORMAL HIGH (ref 70–99)
Glucose-Capillary: 134 mg/dL — ABNORMAL HIGH (ref 70–99)
Glucose-Capillary: 145 mg/dL — ABNORMAL HIGH (ref 70–99)
Glucose-Capillary: 179 mg/dL — ABNORMAL HIGH (ref 70–99)
Glucose-Capillary: 95 mg/dL (ref 70–99)

## 2018-10-30 LAB — CORTISOL: Cortisol, Plasma: >100 ug/dL

## 2018-10-30 LAB — CULTURE, BLOOD (ROUTINE X 2)
Culture: NO GROWTH
Culture: NO GROWTH
Special Requests: ADEQUATE

## 2018-10-30 LAB — HEMOGLOBIN A1C
Hgb A1c MFr Bld: 4.5 % — ABNORMAL LOW (ref 4.8–5.6)
Mean Plasma Glucose: 82.45 mg/dL

## 2018-10-30 LAB — HIV ANTIBODY (ROUTINE TESTING W REFLEX): HIV Screen 4th Generation wRfx: NONREACTIVE

## 2018-10-30 LAB — ECHOCARDIOGRAM COMPLETE
Height: 71 in
Weight: 3171.1 oz

## 2018-10-30 LAB — PROTIME-INR
INR: 2.5 — ABNORMAL HIGH (ref 0.8–1.2)
Prothrombin Time: 26.3 seconds — ABNORMAL HIGH (ref 11.4–15.2)

## 2018-10-30 LAB — MRSA PCR SCREENING: MRSA by PCR: NEGATIVE

## 2018-10-30 MED ORDER — MAGNESIUM SULFATE 2 GM/50ML IV SOLN
2.0000 g | Freq: Once | INTRAVENOUS | Status: AC
  Administered 2018-10-30: 2 g via INTRAVENOUS
  Filled 2018-10-30: qty 50

## 2018-10-30 MED ORDER — CHLORHEXIDINE GLUCONATE CLOTH 2 % EX PADS
6.0000 | MEDICATED_PAD | Freq: Every day | CUTANEOUS | Status: DC
  Administered 2018-10-30 – 2018-11-04 (×6): 6 via TOPICAL

## 2018-10-30 MED ORDER — ORAL CARE MOUTH RINSE
15.0000 mL | OROMUCOSAL | Status: DC
  Administered 2018-10-30 – 2018-11-02 (×24): 15 mL via OROMUCOSAL

## 2018-10-30 MED ORDER — FENTANYL CITRATE (PF) 100 MCG/2ML IJ SOLN
25.0000 ug | INTRAMUSCULAR | Status: DC | PRN
  Administered 2018-10-30: 25 ug via INTRAVENOUS
  Administered 2018-10-30: 10:00:00 100 ug via INTRAVENOUS
  Administered 2018-11-01 (×3): 50 ug via INTRAVENOUS
  Administered 2018-11-01: 25 ug via INTRAVENOUS
  Administered 2018-11-01 – 2018-11-04 (×10): 50 ug via INTRAVENOUS
  Filled 2018-10-30 (×17): qty 2

## 2018-10-30 MED ORDER — VITAL 1.5 CAL PO LIQD
1000.0000 mL | ORAL | Status: DC
  Administered 2018-10-30 – 2018-11-03 (×6): 1000 mL
  Filled 2018-10-30 (×8): qty 1000

## 2018-10-30 MED ORDER — INSULIN ASPART 100 UNIT/ML ~~LOC~~ SOLN
0.0000 [IU] | SUBCUTANEOUS | Status: DC
  Administered 2018-10-30 (×2): 2 [IU] via SUBCUTANEOUS
  Administered 2018-10-30 – 2018-10-31 (×2): 3 [IU] via SUBCUTANEOUS
  Administered 2018-10-31: 5 [IU] via SUBCUTANEOUS
  Administered 2018-10-31: 3 [IU] via SUBCUTANEOUS
  Administered 2018-10-31: 12:00:00 5 [IU] via SUBCUTANEOUS
  Administered 2018-10-31: 3 [IU] via SUBCUTANEOUS
  Administered 2018-10-31: 04:00:00 8 [IU] via SUBCUTANEOUS
  Administered 2018-10-31 – 2018-11-01 (×4): 3 [IU] via SUBCUTANEOUS
  Administered 2018-11-01: 5 [IU] via SUBCUTANEOUS
  Administered 2018-11-02 (×6): 3 [IU] via SUBCUTANEOUS
  Administered 2018-11-03: 09:00:00 2 [IU] via SUBCUTANEOUS
  Administered 2018-11-03 (×4): 3 [IU] via SUBCUTANEOUS
  Administered 2018-11-03: 04:00:00 2 [IU] via SUBCUTANEOUS
  Administered 2018-11-04: 04:00:00 3 [IU] via SUBCUTANEOUS
  Administered 2018-11-04 (×2): 2 [IU] via SUBCUTANEOUS
  Administered 2018-11-04: 12:00:00 3 [IU] via SUBCUTANEOUS

## 2018-10-30 MED ORDER — SODIUM CHLORIDE 0.9% FLUSH: 10.0000 mL | INTRAVENOUS | Status: DC | PRN

## 2018-10-30 MED ORDER — FENTANYL CITRATE (PF) 100 MCG/2ML IJ SOLN
INTRAMUSCULAR | Status: AC
  Administered 2018-10-30: 100 ug via INTRAVENOUS
  Filled 2018-10-30: qty 2

## 2018-10-30 MED ORDER — CHLORHEXIDINE GLUCONATE 0.12 % MT SOLN
15.0000 mL | Freq: Two times a day (BID) | OROMUCOSAL | Status: DC
  Administered 2018-10-30 – 2018-11-03 (×8): 15 mL via OROMUCOSAL
  Filled 2018-10-30 (×4): qty 15

## 2018-10-30 MED ORDER — DEXMEDETOMIDINE HCL IN NACL 400 MCG/100ML IV SOLN
0.4000 ug/kg/h | INTRAVENOUS | Status: DC
  Administered 2018-10-30: 0.4 ug/kg/h via INTRAVENOUS
  Administered 2018-10-31 (×5): 0.8 ug/kg/h via INTRAVENOUS
  Administered 2018-11-01: 1.1 ug/kg/h via INTRAVENOUS
  Administered 2018-11-01: 1.2 ug/kg/h via INTRAVENOUS
  Administered 2018-11-01: 1 ug/kg/h via INTRAVENOUS
  Administered 2018-11-01: 0.5 ug/kg/h via INTRAVENOUS
  Administered 2018-11-02: 03:00:00 1.2 ug/kg/h via INTRAVENOUS
  Administered 2018-11-02: 1 ug/kg/h via INTRAVENOUS
  Administered 2018-11-02 (×2): 1.1 ug/kg/h via INTRAVENOUS
  Administered 2018-11-02: 14:00:00 1 ug/kg/h via INTRAVENOUS
  Administered 2018-11-03: 05:00:00 1.1 ug/kg/h via INTRAVENOUS
  Administered 2018-11-03: 18:00:00 1.2 ug/kg/h via INTRAVENOUS
  Administered 2018-11-03: 01:00:00 1.1 ug/kg/h via INTRAVENOUS
  Administered 2018-11-03: 22:00:00 1.2 ug/kg/h via INTRAVENOUS
  Administered 2018-11-03: 09:00:00 1.1 ug/kg/h via INTRAVENOUS
  Administered 2018-11-04 (×3): 1.2 ug/kg/h via INTRAVENOUS
  Filled 2018-10-30 (×17): qty 100
  Filled 2018-10-30: qty 200
  Filled 2018-10-30 (×7): qty 100

## 2018-10-30 MED ORDER — SODIUM CHLORIDE 0.9 % IV SOLN
INTRAVENOUS | Status: DC
  Administered 2018-10-30: 02:00:00 via INTRAVENOUS

## 2018-10-30 MED ORDER — SODIUM CHLORIDE 0.9% FLUSH
10.0000 mL | Freq: Two times a day (BID) | INTRAVENOUS | Status: DC
  Administered 2018-10-30 (×3): 10 mL

## 2018-10-30 MED ORDER — ETOMIDATE 2 MG/ML IV SOLN
20.0000 mg | Freq: Once | INTRAVENOUS | Status: AC
  Administered 2018-10-30: 20 mg via INTRAVENOUS

## 2018-10-30 MED ORDER — POTASSIUM CHLORIDE 20 MEQ/15ML (10%) PO SOLN
40.0000 meq | Freq: Once | ORAL | Status: AC
  Administered 2018-10-30: 40 meq via ORAL
  Filled 2018-10-30: qty 30

## 2018-10-30 MED ORDER — SODIUM BICARBONATE 8.4 % IV SOLN
100.0000 meq | Freq: Once | INTRAVENOUS | Status: AC
  Administered 2018-10-30: 06:00:00 100 meq via INTRAVENOUS
  Filled 2018-10-30: qty 100

## 2018-10-30 MED ORDER — ORAL CARE MOUTH RINSE
15.0000 mL | Freq: Two times a day (BID) | OROMUCOSAL | Status: DC
  Administered 2018-10-30 – 2018-10-31 (×3): 15 mL via OROMUCOSAL

## 2018-10-30 MED ORDER — CHLORHEXIDINE GLUCONATE 0.12% ORAL RINSE (MEDLINE KIT): 15.0000 mL | Freq: Two times a day (BID) | OROMUCOSAL | Status: DC

## 2018-10-30 MED ORDER — PHENYLEPHRINE HCL-NACL 40-0.9 MG/250ML-% IV SOLN
0.0000 ug/min | INTRAVENOUS | Status: DC
  Administered 2018-10-30: 20 ug/min via INTRAVENOUS
  Filled 2018-10-30 (×2): qty 250

## 2018-10-30 MED ORDER — ROCURONIUM BROMIDE 50 MG/5ML IV SOLN
50.0000 mg | Freq: Once | INTRAVENOUS | Status: AC
  Administered 2018-10-30: 50 mg via INTRAVENOUS
  Filled 2018-10-30: qty 5

## 2018-10-30 MED ORDER — SODIUM CHLORIDE 0.9% FLUSH
10.0000 mL | Freq: Two times a day (BID) | INTRAVENOUS | Status: DC
  Administered 2018-10-30: 10:00:00 10 mL

## 2018-10-30 MED ORDER — PRO-STAT SUGAR FREE PO LIQD
30.0000 mL | Freq: Three times a day (TID) | ORAL | Status: DC
  Administered 2018-10-30 – 2018-11-04 (×14): 30 mL
  Filled 2018-10-30 (×14): qty 30

## 2018-10-30 MED ORDER — CHLORHEXIDINE GLUCONATE CLOTH 2 % EX PADS
6.0000 | MEDICATED_PAD | Freq: Every day | CUTANEOUS | Status: DC
  Administered 2018-10-30 – 2018-11-02 (×4): 6 via TOPICAL

## 2018-10-30 MED ORDER — MIDAZOLAM HCL 2 MG/2ML IJ SOLN: 2.0000 mg | Freq: Once | INTRAMUSCULAR | Status: AC

## 2018-10-30 MED ORDER — ENOXAPARIN SODIUM 100 MG/ML ~~LOC~~ SOLN
90.0000 mg | Freq: Two times a day (BID) | SUBCUTANEOUS | Status: DC
  Administered 2018-10-30 – 2018-11-02 (×5): 90 mg via SUBCUTANEOUS
  Filled 2018-10-30 (×8): qty 0.9

## 2018-10-30 MED ORDER — JUVEN PO PACK
1.0000 | PACK | Freq: Two times a day (BID) | ORAL | Status: DC
  Administered 2018-10-31 – 2018-11-04 (×9): 1
  Filled 2018-10-30 (×10): qty 1

## 2018-10-30 MED ORDER — SODIUM CHLORIDE 0.9% FLUSH
10.0000 mL | INTRAVENOUS | Status: DC | PRN
  Administered 2018-10-30: 10 mL
  Filled 2018-10-30: qty 40

## 2018-10-30 MED ORDER — MIDAZOLAM HCL 2 MG/2ML IJ SOLN
INTRAMUSCULAR | Status: AC
  Administered 2018-10-30: 10:00:00 2 mg
  Filled 2018-10-30: qty 2

## 2018-10-30 MED ORDER — SODIUM BICARBONATE 8.4 % IV SOLN
INTRAVENOUS | Status: DC
  Administered 2018-10-30 (×2): via INTRAVENOUS
  Filled 2018-10-30 (×4): qty 150

## 2018-10-30 MED ORDER — POTASSIUM CHLORIDE 10 MEQ/50ML IV SOLN
10.0000 meq | INTRAVENOUS | Status: AC
  Administered 2018-10-30 (×4): 10 meq via INTRAVENOUS
  Filled 2018-10-30 (×4): qty 50

## 2018-10-30 MED ORDER — FENTANYL CITRATE (PF) 100 MCG/2ML IJ SOLN: 100.0000 ug | Freq: Once | INTRAMUSCULAR | Status: AC

## 2018-10-30 NOTE — Progress Notes (Signed)
Echocardiogram 2D Echocardiogram has been performed.  Oneal Deputy Belmira Daley 10/30/2018, 11:11 AM

## 2018-10-30 NOTE — Progress Notes (Signed)
CRITICAL VALUE ALERT  Critical Value: ABG (repeat)   Date & Time Notied: 10/30/18 5:17  Provider Notified: E-link MD   Orders Received/Actions taken: Waiting for orders

## 2018-10-30 NOTE — Progress Notes (Signed)
Unable to obtain oral or axillary temperatures. Rectal temp 92.3. Dr. Nelda Marseille informed and states okay to replace current foley catheter with temp sensing foley.

## 2018-10-30 NOTE — H&P (Signed)
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    History of present illness   She was initially admitted to [facility] 8/3 from [facility] for increased work of breathing, diagnosed with pneumonia.  Was also found to have a catheter associated UTI, with colonies growing morganella and Ecoli.  She was on vancomycin and zosyn, initially, then transitioned to meropenem 8/6 due to better susceptibilities.  During admission, she was also started on treatment for esophageal candidiasis with fluconazole.  She has difficulty clearing secretions, and found to be high risk for aspiration a modified barium swallow 8/7, and made NPO.    This evening, patient had increasing oxygen requirements and increased work of breathing while on the floor.  She was on 3L up until yesterday, when she was increased to 6L all day today, then further increased to 15L this evening.  Found to have worsening left sided mucus plugging vs consolidation.    On arrival to [facility] ICU, patient is lethargic but oriented and responsive to voice, is having phenylephrine running through peripheral IV at 30, and is satting 99% on 15L O2.    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  Had craniectomy 3/202 History of PE, on xarelto Sacral wounds, s/p debridement of sacral ulcer 08/10/2018, CT negative for osteomyelitis   Significant Hospital Events     Consults:  Palliative care  Procedures:    Significant Diagnostic Tests:  ABG 7.208/48/81  Micro Data:  Urine culture  8/03: Morganella and E coli  Antimicrobials:  Vanc/zosyn 8/3-8/5 Meropenem 8/6- Fluconazole 8/7-  Subjective: Barely responsive this AM  Objective   Blood pressure (!) 106/27, pulse 78, temperature (!) 96.9 F (36.1 C), temperature source Axillary, resp. rate 18, height _0  (1.803 m), weight 89.9 kg, last menstrual period 10/22/2016, SpO2 99 %.    Vent Mode: BIPAP FiO2 (%):  [50 %-60 %] 50 % Set Rate:  [15 bmp] 15 bmp   Intake/Output Summary (Last 24 hours) at 10/30/2018 3428 Last data filed at 10/30/2018 0800 Gross per 24 hour  Intake 1743.59 ml  Output 150 ml  Net 1593.59 ml   Filed Weights   10/26/18 0200 10/30/18 0000 10/30/18 0434  Weight: 87.1 kg 89.9 kg 89.9 kg   Examination: General: Chronically ill appearing female HENT: [place]/AT, PERRL, EOM-I Lungs: Coarse BS diffusely, patient is unable to cough properly Cardiovascular: RRR, Nl S1/S2 and -M/R/G Abdomen: Soft, NT, ND and +BS Extremities: no edema Neuro: paraplegia in bilateral legs, weakness in bilateral upper extremities GU: foley in place  I reviewed CXR myself, LLL infiltrate  Resolved Hospital Problem list     Assessment & Plan:  53 yo F with a history of paraplegia due to metastatic Hurthle cell cancer, sacral wounds, chronic hypotension, chronic indwelling foley catheter, who transferred from [facility] for worsening hypoxic respiratory failure due top left sided mucus plugging vs LLL consoildation, and worsening hypotension.    Acute hypoxic respiratory failure Respiratory acidosis Mucus plugging and aspiration pneumonitis are both at play, weak cough.  We discussed  with patient about trialing bipap or intubating. Patient elected to trial bipap.   - Intubate - Full vent support - No trach/peg per mother - Bronch post intubation with BAL - Continue meropenem.  Added vancomycin empirically for MRSA coverage  Shock Mild hypotension, in the setting of chronic hypotension (thought to be due to  autonomic dysfunction from paraplegia).  Currently on low dose phenylephrine.  Treat for septic shock. - Place order for PICC line - Wean neo - Stress dose steroids - Abx as above - Repleting electrolytes as indicated  UTI Morganella and Ecoli, currently on meropenem, started 8/6.  - Continue meropenem - F/U on culture  Hypokalemia (K2.5) - Repleting with K and Mag - BMET in AM  Esophageal candidiasis - Fluconazole 8/7, continue  Chronic issues: Myelopathy and paraplegia: metastatic spinal involvement, s/p resection last in 08/2017 Hypothyroidism: TSH ~6 in June - IV levothyroxine while NPO History of PE: On xarelto at home.  Will continue therapeutic lovenox here.   - Lovenox 90 BID Hurthle Cell carcinoma: follows with [name]  GOC: - Spoke with mother who is POA, LCB with no CPR, cardioversion, trach or PEG, short term intubation only.  Best practice:  Diet: NPO Pain/Anxiety/Delirium protocol (if indicated): n/a VAP protocol (if indicated): n/a DVT prophylaxis: lovenox, therapeutic GI prophylaxis: n/a Glucose control: SSI Mobility: PT ordered Code Status: Full code Family Communication: discuss tomorrow Disposition:   Labs   CBC: Recent Labs  Lab 11/09/2018 1656 10/26/18 0240 10/27/18 0546 10/29/18 2141 10/30/18 0057 10/30/18 0143 10/30/18 0509 10/30/18 0556  WBC 6.9 12.7* 11.2* 10.2  --  26.4*  --  26.9*  NEUTROABS 6.0  --  10.4*  --   --   --   --   --   HGB 8.3* 8.0* 7.2* 8.4* 10.2* 8.4* 9.9* 9.7*  HCT 28.5* 27.4* 24.4* 28.5* 30.0* 27.5* 29.0* 32.5*  MCV 100.0 99.6 97.6 100.4*  --  97.5  --  100.0  PLT 199 201 188 175  --  351  --  973    Basic Metabolic Panel: Recent Labs  Lab 10/26/18 0607 10/27/18 0546 10/29/18 0500 10/29/18 2141 10/30/18 0057 10/30/18 0143 10/30/18 0509 10/30/18 0556  NA 141 140  --  144 146* 144 145 147*  K 4.1 3.4*  --  2.5* 2.5* 2.6* 2.9* 3.0*  CL 104 105  --  111  --  111  --  112*  CO2 19* 24  --  18*  --  16*   --  16*  GLUCOSE 135* 88  --  101*  --  117*  --  119*  BUN 22* 24*  --  30*  --  27*  --  30*  CREATININE 1.05* 1.02* 0.90 1.07*  --  1.43*  --  1.41*  CALCIUM 7.7* 7.4*  --  7.7*  --  7.8*  --  7.9*   GFR: Estimated Creatinine Clearance: 57.8 mL/min (A) (by C-G formula based on SCr of 1.41 mg/dL (H)). Recent Labs  Lab 11/01/2018 1656 10/26/18 5329 10/27/18 0546 10/28/18 0440 10/29/18 2141 10/29/18 2142 10/30/18 0142 10/30/18 0143 10/30/18 0556 10/30/18 0557  PROCALCITON  --  6.09 6.26 4.70  --   --   --   --   --   --   WBC 6.9 12.7* 11.2*  --  10.2  --   --  26.4* 26.9*  --   LATICACIDVEN 0.6  --   --   --   --  1.0 1.2  --   --  1.5    Liver Function Tests: Recent Labs  Lab 10/31/2018 1656 10/26/18 0607 10/27/18 0546 10/29/18 2141 10/30/18 0143  AST 130* 92* 54* 25 29  ALT _0 ALKPHOS 178* 156* 122 101 114  BILITOT 1.0 1.9* 1.2 1.3* 1.7*  PROT 6.0* 5.7* 5.2* 5.5* 5.3*  ALBUMIN 2.3* 2.2* 1.9* 1.8* 1.6*   No results for input(s): LIPASE, AMYLASE in the last 168 hours. No results for input(s): AMMONIA in the last 168 hours.  ABG    Component Value Date/Time   PHART 7.180 (LL) 10/30/2018 0509   PCO2ART 43.0 10/30/2018 0509   PO2ART 81.0 (L) 10/30/2018 0509   HCO3 16.3 (L) 10/30/2018 0509   TCO2 18 (L) 10/30/2018 0509   ACIDBASEDEF 12.0 (H) 10/30/2018 0509   O2SAT 93.0 10/30/2018 0509     Coagulation Profile: Recent Labs  Lab 11/22/2018 1656 10/30/18 0143  INR 2.5* 2.5*    Cardiac Enzymes: No results for input(s): CKTOTAL, CKMB, CKMBINDEX, TROPONINI in the last 168 hours.  HbA1C: Hgb A1c MFr Bld  Date/Time Value Ref Range Status  10/30/2018 05:57 AM 4.5 (L) 4.8 - 5.6 % Final    Comment:    (NOTE) Pre diabetes:          5.7%-6.4% Diabetes:              >6.4% Glycemic control for   <7.0% adults with diabetes   08/09/2017 10:52 AM 5.5 4.8 - 5.6 % Final    Comment:    (NOTE) Pre diabetes:          5.7%-6.4% Diabetes:              >6.4%  Glycemic control for   <7.0% adults with diabetes     CBG: Recent Labs  Lab 10/29/18 2051 10/29/18 2308 10/29/18 2309 10/30/18 0041 10/30/18 0716  GLUCAP 94 93 97 95 120*   The patient is critically ill with multiple organ systems failure and requires high complexity decision making for assessment and support, frequent evaluation and titration of therapies, application of advanced monitoring technologies and extensive interpretation of multiple databases.   Critical Care Time devoted to patient care services described in this note is  60  Minutes. This time reflects time of care of this signee Dr [name]. This critical care time does not reflect procedure time, or teaching time or supervisory time of PA/NP/Med student/Med Resident etc but could involve care discussion time.  Rush Farmer, M.D. [facility] Pulmonary/Critical Care Medicine. Pager: [phone]. After hours pager: [phone].

## 2018-10-30 NOTE — Procedures (Signed)
Intubation Procedure Note Sarah [name] [id number] [birth year]  Procedure: Intubation Indications: Respiratory insufficiency  Procedure Details Consent: Risks of procedure as well as the alternatives and risks of each were explained to the (patient/caregiver).  Consent for procedure obtained. Time Out: Verified patient identification, verified procedure, site/side was marked, verified correct patient position, special equipment/implants available, medications/allergies/relevent history reviewed, required imaging and test results available.  Performed  Maximum sterile technique was used including cap, gloves, hand hygiene and mask.  MAC    Evaluation Hemodynamic Status: BP stable throughout; O2 sats: stable throughout Patient's Current Condition: stable Complications: No apparent complications Patient did tolerate procedure well. Chest X-ray ordered to verify placement.  CXR: pending.   Sarah [name] 10/30/2018

## 2018-10-30 NOTE — Progress Notes (Signed)
Initial Nutrition Assessment  DOCUMENTATION CODES:   Not applicable  INTERVENTION:   Vital 1.5 @45ml /hr + Prostat 48ml TID via tube- Initiate at 95ml/hr and increase by 39ml/hr q 8 hours until goal rate is reached.   Free water flushes 46ml q4 hours to maintain tube patency   Regimen provides 1920kcal/day, 118g/day protein, 1044ml/day free water   Juven Fruit Punch BID, each serving provides 95kcal and 2.5g of protein (amino acids glutamine and arginine)  Pt at high refeed risk; recommend monitor K, Mg and P labs daily   NUTRITION DIAGNOSIS:   Inadequate oral intake related to inability to eat(pt sedated and ventilated) as evidenced by NPO status.  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  RD working remotely.  Pt ventilated and s/p bronchoscopy and NGT placement. Plan is to initiate tube feeds today. Pt seen by RD yesterday and reported poor appetite and oral intake at baseline; pt has been eating mainly soft foods. Per chart, pt with 30lb(13%) weight loss in < 8 months.   Medications reviewed and include: dulcolax, lovenox, insulin, synthroid, protonix, dilfucan, meropenem, neosynephrine, Na Bicarb, vancomycin    Labs reviewed: Na 147(H), K 2.4(L), BUN 31(H), creat 1.50(H) Wbc- 26.9(H), Hgb 9.7(L), Hct 32.5(L) [name] currently intubated on ventilator support MV: 12.9 L/min Temp (24hrs), Avg:93.3 F (34.1 C), Min:91.8 F (33.2 C), Max:94.6 F (34.8 C)  Propofol: none   MAP- >52mmHg  UOP- 17ml  Unable to complete Nutrition-Focused physical exam  at this time.   Diet Order:   Diet Order            Diet NPO time specified  Diet effective now             EDUCATION NEEDS:   Not appropriate for education at this time  Skin:  Skin Assessment: Reviewed RN Assessment(Stage III R and L buttocks, DTI sacrum, Stage II heel) Skin Integrity Issues:: Stage III Stage III: to left and right buttock  Last BM:  8/8- type 6  Height:   Ht Readings from Last 1 Encounters:  10/26/18 5\' 11"  (1.803 m)    Weight:   Wt Readings from Last 1 Encounters:  10/30/18 89.9 kg    Ideal Body Weight:  70.4 kg  BMI:  Body mass index is 27.64 kg/m.  Estimated Nutritional Needs:   Kcal:  1909kcal/day  Protein:  105-120g/day  Fluid:  >2.1L/day  Koleen Distance MS, RD, LDN Pager #- [phone] Office#- [phone] After Hours Pager: [phone]

## 2018-10-30 NOTE — Progress Notes (Signed)
[facility] Progress Note Patient Name: Sarah [name] DOB: 02-01-1966 MRN: [id number]   Date of Service  10/30/2018  HPI/Events of Note  Notified of need for restraints Also with loose watery stools and aline not working  eICU Interventions   Restraints ordered  Flexiseal to be placed, patient did receive laxative  Aline to be replaced c/o RT     Intervention Category Intermediate Interventions: Other:  Judd Lien 10/30/2018, 9:44 PM

## 2018-10-30 NOTE — Procedures (Signed)
Bronchoscopy Procedure Note Sarah [name] [id number] Aug 09, 1965  Procedure: Bronchoscopy Indications: Diagnostic evaluation of the airways and Obtain specimens for culture and/or other diagnostic studies  Procedure Details Consent: Risks of procedure as well as the alternatives and risks of each were explained to the (patient/caregiver).  Consent for procedure obtained. Time Out: Verified patient identification, verified procedure, site/side was marked, verified correct patient position, special equipment/implants available, medications/allergies/relevent history reviewed, required imaging and test results available.  Performed  In preparation for procedure, patient was given 100% FiO2 and bronchoscope lubricated. Sedation: Benzodiazepines, Muscle relaxants, Etomidate and Fentanyl  Airway entered and the following bronchi were examined: RUL, RML, RLL, LUL, LLL and Bronchi.   Purulent mucous obstructing the left main stem Bronchoscope removed.  , Patient placed back on 100% FiO2 at conclusion of procedure.    Evaluation Hemodynamic Status: BP stable throughout; O2 sats: stable throughout Patient's Current Condition: stable Specimens:  Sent purulent fluid Complications: No apparent complications Patient did tolerate procedure well.   [name] 10/30/2018

## 2018-10-30 NOTE — H&P (Signed)
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    History of present illness   She was initially admitted to [facility] 8/3 from [facility] for increased work of breathing, diagnosed with pneumonia.  Was also found to have a catheter associated UTI, with colonies growing morganella and Ecoli.  She was on vancomycin and zosyn, initially, then transitioned to meropenem 8/6 due to better susceptibilities.  During admission, she was also started on treatment for esophageal candidiasis with fluconazole.  She has difficulty clearing secretions, and found to be high risk for aspiration a modified barium swallow 8/7, and made NPO.    This evening, patient had increasing oxygen requirements and increased work of breathing while on the floor.  She was on 3L up until yesterday, when she was increased to 6L all day today, then further increased to 15L this evening.  Found to have worsening left sided mucus plugging vs consolidation.    On arrival to [facility] ICU, patient is lethargic but oriented and responsive to voice, is having phenylephrine running through peripheral IV at 30, and is satting 99% on 15L O2.    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  Had craniectomy 3/202 History of PE, on xarelto Sacral wounds, s/p debridement of sacral ulcer 08/10/2018, CT negative for osteomyelitis   Significant Hospital Events     Consults:  Palliative care  Procedures:    Significant Diagnostic Tests:  ABG 7.208/48/81  Micro Data:  Urine culture  8/03: Morganella and E coli  Antimicrobials:  Vanc/zosyn 8/3-8/5 Meropenem 8/6- Fluconazole 8/7-    Objective   Blood pressure (!) 73/48, pulse 69, temperature (!) 96.9 F (36.1 C), temperature source Axillary, resp. rate (!) 24, height 5\' 11"  (1.803 m), weight 87.1 kg, last menstrual period 10/22/2016, SpO2 98 %.        Intake/Output Summary (Last 24 hours) at 10/30/2018 0026 Last data filed at 10/30/2018 0017 Gross per 24 hour  Intake 1810.4 ml  Output 150 ml  Net 1660.4 ml   Filed Weights   10/31/2018 1637 10/26/18 0200  Weight: 87.1 kg 87.1 kg    Examination: General: lethargic, responsive to voice HENT: has laceration on chin, not draining Lungs: bilateral coarse crackles Cardiovascular: 1+ radial pulses, no murmurs  Abdomen: soft, nontender, nondistended Extremities: no edema Neuro: paraplegia in bilateral legs, weakness in bilateral upper extremities GU: foley in place  Resolved Hospital Problem list     Assessment & Plan:  53 yo F with a history of paraplegia due to metastatic Hurthle cell cancer, sacral wounds, chronic hypotension, chronic indwelling foley catheter, who transferred from [facility] for worsening hypoxic respiratory failure due top left sided mucus plugging vs LLL consoildation, and worsening hypotension.    # Acute hypoxic respiratory failure # Respiratory acidosis Mucus plugging and aspiration pneumonitis are both at play, weak cough.  We discussed with patient about trialing bipap or intubating. Patient elected to trial bipap.   - bipap, if unable to tolerate, or respiratory acidosis worsens, will need to intubate - vest therapy  during bipap breaks.  levalbulterol nebs and mucomyst - continue meropenem.  Added vancomycin empirically for MRSA coverage - ABG trend  # Shock Mild hypotension, in the setting of chronic hypotension (thought to be due to autonomic dysfunction from paraplegia).  Currently on low dose phenylephrine.  Treat for septic  shock. - IV access with midline, need PICC in the am likely - wean neo - stress dose steroids added - abx as above - repleting electrolytes  # UTI Morganella and Ecoli, currently on meropenem, started 8/6.  - continue meropenem  # Hypokalemia (K2.5) - repleting with K and Mag  # Esophageal candidiasis - started on fluconazole 8/7  Chronic issues: # Myelopathy and paraplegia: metastatic spinal involvement, s/p resection last in 08/2017 # Hypothyroidism: TSH ~6 in June - IV levothyroxine while NPO # History of PE: On xarelto at home.  Will continue therapeutic lovenox here.   - lovenox 90 BID # Hurthle Cell carcinoma: follows with [name]   Best practice:  Diet: NPO Pain/Anxiety/Delirium protocol (if indicated): n/a VAP protocol (if indicated): n/a DVT prophylaxis: lovenox, therapeutic GI prophylaxis: n/a Glucose control: SSI Mobility: PT ordered Code Status: Full code Family Communication: discuss tomorrow Disposition:   Labs   CBC: Recent Labs  Lab 10/28/2018 1656 10/26/18 0607 10/27/18 0546 10/29/18 2141  WBC 6.9 12.7* 11.2* 10.2  NEUTROABS 6.0  --  10.4*  --   HGB 8.3* 8.0* 7.2* 8.4*  HCT 28.5* 27.4* 24.4* 28.5*  MCV 100.0 99.6 97.6 100.4*  PLT 199 201 188 779    Basic Metabolic Panel: Recent Labs  Lab 11/02/2018 1656 10/26/18 0607 10/27/18 0546 10/29/18 0500 10/29/18 2141  NA 141 141 140  --  144  K 3.7 4.1 3.4*  --  2.5*  CL 106 104 105  --  111  CO2 23 19* 24  --  18*  GLUCOSE 73 135* 88  --  101*  BUN 18 22* 24*  --  30*  CREATININE 0.86 1.05* 1.02* 0.90 1.07*  CALCIUM 7.6* 7.7* 7.4*  --  7.7*   GFR: Estimated Creatinine Clearance: 75.1 mL/min (A) (by C-G formula based on SCr of 1.07 mg/dL (H)). Recent Labs  Lab 11/10/2018 1656 10/26/18 0607 10/27/18 0546 10/28/18 0440 10/29/18 2141 10/29/18 2142  PROCALCITON  --  6.09 6.26 4.70  --   --   WBC 6.9 12.7* 11.2*  --  10.2  --   LATICACIDVEN 0.6  --   --   --   --  1.0    Liver  Function Tests: Recent Labs  Lab 11/16/2018 1656 10/26/18 0607 10/27/18 0546 10/29/18 2141  AST 130* 92* 54* 25  ALT 31 27 22 20   ALKPHOS 178* 156* 122 101  BILITOT 1.0 1.9* 1.2 1.3*  PROT 6.0* 5.7* 5.2* 5.5*  ALBUMIN 2.3* 2.2* 1.9* 1.8*   No results for input(s): LIPASE, AMYLASE in the last 168 hours. No results for input(s): AMMONIA in the last 168 hours.  ABG    Component Value Date/Time   PHART 7.208 (L) 10/29/2018 2110   PCO2ART 48.2 (H) 10/29/2018 2110   PO2ART 81.6 (L) 10/29/2018 2110   HCO3 17.4 (L) 10/29/2018 2110   TCO2 26 07/06/2015 0059   ACIDBASEDEF 8.0 (H) 10/29/2018 2110   O2SAT 93.9 10/29/2018 2110     Coagulation Profile: Recent Labs  Lab 11/22/2018 1656  INR 2.5*    Cardiac Enzymes: No results for input(s): CKTOTAL, CKMB, CKMBINDEX, TROPONINI in the last 168 hours.  HbA1C: Hemoglobin  A1C  Date/Time Value Ref Range Status  08/05/2016 12:13 PM 5.9  Final   Hgb A1c MFr Bld  Date/Time Value Ref Range Status  08/09/2017 10:52 AM 5.5 4.8 - 5.6 % Final    Comment:    (NOTE) Pre diabetes:          5.7%-6.4% Diabetes:              >6.4% Glycemic control for   <7.0% adults with diabetes   07/06/2015 07:01 AM 5.7 (H) 4.8 - 5.6 % Final    Comment:    (NOTE)         Pre-diabetes: 5.7 - 6.4         Diabetes: >6.4         Glycemic control for adults with diabetes: <7.0     CBG: Recent Labs  Lab 10/29/18 2051 10/29/18 2308 10/29/18 2309  GLUCAP 94 93 97    Review of Systems:   ROS negative for abdominal pain, chest pain.  Positive for shortness of breath, fatigue.    Past Medical History  She,  has a past medical history of Anxiety, Cancer ([place]), Chronic back pain, Complication of anesthesia, DDD (degenerative disc disease), cervical, Depression, DJD (degenerative joint disease), Family history of adverse reaction to anesthesia, GERD (gastroesophageal reflux disease), History of blood transfusion, History of rectal fissure, HIT  (heparin-induced thrombocytopenia) ([place]), Hypertension, Hypothyroidism, Obesity, Paraplegia at T4 level [employer]), Pneumonia (03/2018), Pulmonary embolus, right ([place]) (2015), and Rotator cuff tendonitis (right).   Surgical History    Past Surgical History:  Procedure Laterality Date  . APPLICATION OF CRANIAL NAVIGATION N/A 06/01/2018   Procedure: APPLICATION OF CRANIAL NAVIGATION;  Surgeon: Erline Levine, MD;  Location: [place];  Service: Neurosurgery;  Laterality: N/A;  . CHOLECYSTECTOMY N/A 01/25/2018   Procedure: LAPAROSCOPIC CHOLECYSTECTOMY;  Surgeon: Virl Cagey, MD;  Location: [facility];  Service: General;  Laterality: N/A;  . CRANIOTOMY N/A 06/01/2018   Procedure: Craniectomy for skull metastasis with Brainlab;  Surgeon: Erline Levine, MD;  Location: [place];  Service: Neurosurgery;  Laterality: N/A;  . FEMUR IM NAIL Left 09/11/2017   Procedure: INTRAMEDULLARY (IM) RETROGRADE FEMORAL NAILING;  Surgeon: Altamese Barnhill, MD;  Location: [place];  Service: Orthopedics;  Laterality: Left;  . LAMINECTOMY N/A 12/14/2013   Procedure: THORACIC LAMINECTOMY FOR TUMOR THORACIC THREE;  Surgeon: Ashok Pall, MD;  Location: [place] [facility];  Service: Neurosurgery;  Laterality: N/A;  THORACIC LAMINECTOMY FOR TUMOR THORACIC THREE  . LAMINECTOMY N/A 07/05/2015   Procedure: THORACIC LAMINECTOMY FOR TUMOR;  Surgeon: Ashok Pall, MD;  Location: [place] [facility];  Service: Neurosurgery;  Laterality: N/A;  . LAMINECTOMY N/A 09/03/2017   Procedure: POSTERIOR SPINAL TUMOR RESECTION THORACIC THREE;  Surgeon: Ashok Pall, MD;  Location: [place];  Service: Neurosurgery;  Laterality: N/A;  POSTERIOR SPINAL TUMOR RESECTION THORACIC THREE  . POSTERIOR LUMBAR FUSION 4 LEVEL N/A 12/30/2013   Procedure: Thoracic one-Thoracic five posterior thoracic fusion with pedicle screws;  Surgeon: Ashok Pall, MD;  Location: [place] [facility];  Service: Neurosurgery;  Laterality: N/A;  Thoracic one-Thoracic five posterior thoracic fusion with pedicle screws   . THYROIDECTOMY N/A 01/09/2014   Procedure: TOTAL THYROIDECTOMY;  Surgeon: Izora Gala, MD;  Location: [place];  Service: ENT;  Laterality: N/A;  . TONSILLECTOMY       Social History   reports that she has never smoked. She has never used smokeless tobacco. She reports that she does not drink alcohol or use drugs.   Family History  Her family history includes Diabetes in her father, mother, sister, and sister; Hypertension in her father and mother; Stroke in her father.   Allergies Allergies  Allergen Reactions  . Bee Venom Anaphylaxis  . Heparin Other (See Comments)    Weak positive platelets induced antibodies, SRA negative--2015  . Keflex [Cephalexin] Nausea And Vomiting  . Tramadol Nausea And Vomiting  . Gabapentin Nausea And Vomiting  . Hydrochlorothiazide Nausea And Vomiting  . Hydrocodone Nausea And Vomiting  . Oxycodone-Acetaminophen Nausea And Vomiting  . Sorbitol Nausea Only    And abdominal distress  . Tizanidine     Fatigue      Home Medications  Prior to Admission medications   Medication Sig Start Date End Date Taking? Authorizing Provider  acetaminophen (TYLENOL) 650 MG CR tablet Take 1,300 mg by mouth every 6 (six) hours as needed for pain.   Yes [provider]  Amino Acids-Protein Hydrolys (PRO-STAT PO) Take 30 mLs by mouth 2 (two) times daily.   Yes [provider]  baclofen (LIORESAL) 20 MG tablet Take 1 tablet (20 mg total) by mouth 3 (three) times daily. 09/02/18  Yes Angiulli, Lavon Paganini, PA-C  bisacodyl (DULCOLAX) 10 MG suppository Place 1 suppository (10 mg total) rectally daily. Patient taking differently: Place 10 mg rectally daily as needed for mild constipation or moderate constipation.  09/02/18  Yes Angiulli, Lavon Paganini, PA-C  diclofenac sodium (VOLTAREN) 1 % GEL Apply 2 g topically 3 (three) times daily. Patient taking differently: Apply 2 g topically 2 (two) times daily. Left shoulder 09/02/18  Yes Angiulli, Lavon Paganini, PA-C  ferrous  sulfate 325 (65 FE) MG tablet Take 1 tablet (325 mg total) by mouth every other day. 08/17/18  Yes [name], MD  fludrocortisone (FLORINEF) 0.1 MG tablet Take 1 tablet (0.1 mg total) by mouth daily. 09/03/18  Yes Angiulli, Lavon Paganini, PA-C  fluticasone (FLONASE) 50 MCG/ACT nasal spray Place 1 spray into both nostrils daily.   Yes [provider]  guaifenesin (HUMIBID E) 400 MG TABS tablet Take 400 mg by mouth 3 (three) times daily. 14 day course starting on 10/24/2018   Yes [provider]  ipratropium-albuterol (DUONEB) 0.5-2.5 (3) MG/3ML SOLN Take 3 mLs by nebulization every 8 (eight) hours. *May inhale every 6 hours as needed for shortness of breath/wheezing   Yes [provider]  levothyroxine (SYNTHROID) 150 MCG tablet Take 1 tablet (150 mcg total) by mouth daily before breakfast. 07/29/18 10/26/2018 Yes [name], MD  loratadine (CLARITIN) 10 MG tablet Take 10 mg by mouth daily.   Yes [provider]  Multiple Vitamin (MULTIVITAMIN WITH MINERALS) TABS tablet Take 1 tablet by mouth daily. 02/24/18  Yes Johnson, Clanford L, MD  ondansetron (ZOFRAN) 4 MG tablet Take 4 mg by mouth every 6 (six) hours as needed for nausea or vomiting.   Yes [provider]  oxyCODONE (ROXICODONE) 5 MG immediate release tablet Take 1 tablet (5 mg total) by mouth every 6 (six) hours as needed for severe pain. 09/02/18 09/02/19 Yes Angiulli, Lavon Paganini, PA-C  pantoprazole (PROTONIX) 40 MG tablet Take 1 tablet (40 mg total) by mouth daily. Patient taking differently: Take 40 mg by mouth daily.  08/09/18  Yes [name], MD  polyethylene glycol (MIRALAX / GLYCOLAX) 17 g packet Take 17 g by mouth daily. 09/03/18  Yes Angiulli, Lavon Paganini, PA-C  rivaroxaban (XARELTO) 20 MG TABS tablet Take 1 tablet (20 mg total) by mouth daily with supper. Patient  taking differently: Take 20 mg by mouth every morning.  07/19/18  Yes Diallo, Abdoulaye, MD  sertraline (ZOLOFT) 50 MG tablet Take 50 mg  by mouth daily.  12/24/17  Yes [provider]     Critical care time: 65 minutes

## 2018-10-30 NOTE — Progress Notes (Signed)
Spoke with Legrand Como RN re PICC order to be reordered.  States pt is very drowsy and should have consent for PICC by family.

## 2018-10-30 NOTE — Progress Notes (Signed)
Order for PICC placement noted. Arrived at bedside to find SBP only 86  with vasopressors infusing, and Respirations >30. Instructed primary RN that IV Team would reassess later this AM to see if patient was more stable before attempting PICC insertion.

## 2018-10-30 NOTE — Progress Notes (Signed)
Peripherally Inserted Central Catheter/Midline Placement  The IV Nurse has discussed with the patient and/or persons authorized to consent for the patient, the purpose of this procedure and the potential benefits and risks involved with this procedure.  The benefits include less needle sticks, lab draws from the catheter, and the patient may be discharged home with the catheter. Risks include, but not limited to, infection, bleeding, blood clot (thrombus formation), and puncture of an artery; nerve damage and irregular heartbeat and possibility to perform a PICC exchange if needed/ordered by physician.  Alternatives to this procedure were also discussed.  Bard Power PICC patient education guide, fact sheet on infection prevention and patient information card has been provided to patient /or left at bedside. Telephone consent obtained from mother.  Pt lethargic, oriented, also gave verbal and implied consent.  Nodded to having had a PICC in the past.  Pt cooperative during procedure.   PICC/Midline Placement Documentation  PICC Triple Lumen 10/30/18 PICC Right Brachial 39 cm 1 cm (Active)  Indication for Insertion or Continuance of Line Vasoactive infusions;Poor Vasculature-patient has had multiple peripheral attempts or PIVs lasting less than 24 hours 10/30/18 0922  Exposed Catheter (cm) 1 cm 10/30/18 5217  Site Assessment Clean;Dry;Intact 10/30/18 0922  Lumen #1 Status Saline locked;Flushed;Blood return noted 10/30/18 0922  Lumen #2 Status Flushed;Saline locked;Blood return noted 10/30/18 0922  Lumen #3 Status Flushed;Saline locked;Blood return noted 10/30/18 4715  Dressing Type Transparent 10/30/18 0922  Dressing Status Clean;Dry;Intact;Antimicrobial disc in place 10/30/18 [place] checked and tightened 10/30/18 9539  Line Adjustment (NICU/IV Team Only) No 10/30/18 0922  Dressing Intervention New dressing 10/30/18 0922  Dressing Change Due 11/06/18 10/30/18 0922        Rolena Infante 10/30/2018, 9:23 AM

## 2018-10-30 NOTE — Procedures (Signed)
NGT Placement By MD  Done under direct laryngoscopy and verified by auscultation  Rush Farmer, M.D. [facility] Pulmonary/Critical Care Medicine. Pager: [phone]. After hours pager: [phone].

## 2018-10-30 NOTE — Procedures (Signed)
Arterial Catheter Insertion Procedure Note Sarah [name] [id number] 20-Nov-1965  Procedure: Insertion of Arterial Catheter  Indications: Blood pressure monitoring  Procedure Details Consent: Unable to obtain consent because of emergent medical necessity. Time Out: Verified patient identification, verified procedure, site/side was marked, verified correct patient position, special equipment/implants available, medications/allergies/relevent history reviewed, required imaging and test results available.  Performed  Maximum sterile technique was used including antiseptics. Skin prep: Chlorhexidine; local anesthetic administered 20 gauge catheter was inserted into left radial artery using the Seldinger technique. ULTRASOUND GUIDANCE USED: NO Evaluation Blood flow good; BP tracing good. Complications: No apparent complications.   Pierre Bali 10/30/2018

## 2018-10-31 ENCOUNTER — Inpatient Hospital Stay (HOSPITAL_COMMUNITY): Payer: Medicare Other

## 2018-10-31 LAB — POCT I-STAT 7, (LYTES, BLD GAS, ICA,H+H)
Acid-Base Excess: 1 mmol/L (ref 0.0–2.0)
Acid-Base Excess: 2 mmol/L (ref 0.0–2.0)
Bicarbonate: 22.6 mmol/L (ref 20.0–28.0)
Bicarbonate: 25.4 mmol/L (ref 20.0–28.0)
Calcium, Ion: 1.13 mmol/L — ABNORMAL LOW (ref 1.15–1.40)
Calcium, Ion: 1.09 mmol/L — ABNORMAL LOW (ref 1.15–1.40)
HCT: 21 % — ABNORMAL LOW (ref 36.0–46.0)
HCT: 35 % — ABNORMAL LOW (ref 36.0–46.0)
Hemoglobin: 7.1 g/dL — ABNORMAL LOW (ref 12.0–15.0)
Hemoglobin: 11.9 g/dL — ABNORMAL LOW (ref 12.0–15.0)
O2 Saturation: 98 %
O2 Saturation: 98 %
Patient temperature: 37
Patient temperature: 37
Potassium: 3.5 mmol/L (ref 3.5–5.1)
Potassium: 3.4 mmol/L — ABNORMAL LOW (ref 3.5–5.1)
Sodium: 148 mmol/L — ABNORMAL HIGH (ref 135–145)
Sodium: 150 mmol/L — ABNORMAL HIGH (ref 135–145)
TCO2: 23 mmol/L (ref 22–32)
TCO2: 26 mmol/L (ref 22–32)
pCO2 arterial: 24.1 mmHg — ABNORMAL LOW (ref 32.0–48.0)
pCO2 arterial: 33.6 mmHg (ref 32.0–48.0)
pH, Arterial: 7.581 — ABNORMAL HIGH (ref 7.350–7.450)
pH, Arterial: 7.487 — ABNORMAL HIGH (ref 7.350–7.450)
pO2, Arterial: 79 mmHg — ABNORMAL LOW (ref 83.0–108.0)
pO2, Arterial: 95 mmHg (ref 83.0–108.0)

## 2018-10-31 LAB — BASIC METABOLIC PANEL
Anion gap: 14 (ref 5–15)
BUN: 32 mg/dL — ABNORMAL HIGH (ref 6–20)
CO2: 27 mmol/L (ref 22–32)
Calcium: 6.5 mg/dL — ABNORMAL LOW (ref 8.9–10.3)
Chloride: 105 mmol/L (ref 98–111)
Creatinine, Ser: 1.29 mg/dL — ABNORMAL HIGH (ref 0.44–1.00)
GFR calc Af Amer: 55 mL/min — ABNORMAL LOW (ref >60)
GFR calc non Af Amer: 48 mL/min — ABNORMAL LOW (ref >60)
Glucose, Bld: 336 mg/dL — ABNORMAL HIGH (ref 70–99)
Potassium: 4 mmol/L (ref 3.5–5.1)
Sodium: 146 mmol/L — ABNORMAL HIGH (ref 135–145)

## 2018-10-31 LAB — GLUCOSE, CAPILLARY
Glucose-Capillary: 147 mg/dL — ABNORMAL HIGH (ref 70–99)
Glucose-Capillary: 182 mg/dL — ABNORMAL HIGH (ref 70–99)
Glucose-Capillary: 185 mg/dL — ABNORMAL HIGH (ref 70–99)
Glucose-Capillary: 194 mg/dL — ABNORMAL HIGH (ref 70–99)
Glucose-Capillary: 218 mg/dL — ABNORMAL HIGH (ref 70–99)
Glucose-Capillary: 225 mg/dL — ABNORMAL HIGH (ref 70–99)
Glucose-Capillary: 255 mg/dL — ABNORMAL HIGH (ref 70–99)

## 2018-10-31 LAB — CBC
HCT: 22.1 % — ABNORMAL LOW (ref 36.0–46.0)
HCT: 24.8 % — ABNORMAL LOW (ref 36.0–46.0)
Hemoglobin: 7.3 g/dL — ABNORMAL LOW (ref 12.0–15.0)
Hemoglobin: 8.1 g/dL — ABNORMAL LOW (ref 12.0–15.0)
MCH: 29.8 pg (ref 26.0–34.0)
MCH: 29.7 pg (ref 26.0–34.0)
MCHC: 33 g/dL (ref 30.0–36.0)
MCHC: 32.7 g/dL (ref 30.0–36.0)
MCV: 90.2 fL (ref 80.0–100.0)
MCV: 90.8 fL (ref 80.0–100.0)
Platelets: 129 10*3/uL — ABNORMAL LOW (ref 150–400)
Platelets: 133 10*3/uL — ABNORMAL LOW (ref 150–400)
RBC: 2.45 MIL/uL — ABNORMAL LOW (ref 3.87–5.11)
RBC: 2.73 MIL/uL — ABNORMAL LOW (ref 3.87–5.11)
RDW: 19.2 % — ABNORMAL HIGH (ref 11.5–15.5)
RDW: 19.3 % — ABNORMAL HIGH (ref 11.5–15.5)
WBC: 13.2 10*3/uL — ABNORMAL HIGH (ref 4.0–10.5)
WBC: 11.3 10*3/uL — ABNORMAL HIGH (ref 4.0–10.5)
nRBC: 0.4 % — ABNORMAL HIGH (ref 0.0–0.2)
nRBC: 0.4 % — ABNORMAL HIGH (ref 0.0–0.2)

## 2018-10-31 LAB — MAGNESIUM: Magnesium: 2.1 mg/dL (ref 1.7–2.4)

## 2018-10-31 LAB — PHOSPHORUS: Phosphorus: <1 mg/dL — CL (ref 2.5–4.6)

## 2018-10-31 MED ORDER — SODIUM PHOSPHATES 45 MMOLE/15ML IV SOLN
10.0000 mmol | Freq: Once | INTRAVENOUS | Status: AC
  Administered 2018-10-31: 06:00:00 10 mmol via INTRAVENOUS
  Filled 2018-10-31: qty 3.33

## 2018-10-31 MED ORDER — OXYMETAZOLINE HCL 0.05 % NA SOLN
1.0000 | Freq: Two times a day (BID) | NASAL | Status: DC | PRN
  Administered 2018-10-31: 1 via NASAL
  Filled 2018-10-31: qty 30

## 2018-10-31 MED ORDER — PHENYLEPHRINE HCL-NACL 10-0.9 MG/250ML-% IV SOLN
0.0000 ug/min | INTRAVENOUS | Status: DC
  Administered 2018-10-31: 40 ug/min via INTRAVENOUS
  Filled 2018-10-31: qty 250

## 2018-10-31 MED ORDER — LEVOTHYROXINE SODIUM 150 MCG PO TABS
150.0000 ug | ORAL_TABLET | Freq: Every day | ORAL | Status: DC
  Administered 2018-10-31 – 2018-11-04 (×5): 150 ug
  Filled 2018-10-31 (×5): qty 1

## 2018-10-31 MED ORDER — FREE WATER
250.0000 mL | Freq: Four times a day (QID) | Status: DC
  Administered 2018-10-31 – 2018-11-01 (×4): 250 mL

## 2018-10-31 MED ORDER — PANTOPRAZOLE SODIUM 40 MG PO PACK
40.0000 mg | PACK | Freq: Every day | ORAL | Status: DC
  Administered 2018-10-31 – 2018-11-04 (×5): 40 mg
  Filled 2018-10-31 (×5): qty 20

## 2018-10-31 NOTE — Progress Notes (Signed)
RN noticed dry streak of blood down to her neck coming from left nostril where the NG tube is placed. Few minutes later, RN noticed more bleeding coming down from the left nostril. E-link RN and MD notified. MD ordered to hold Enoxaparin and apply pressure. MD will notify ground team about the finding.

## 2018-10-31 NOTE — Progress Notes (Signed)
CRITICAL VALUE ALERT  Critical Value: Phos <1.0   Date & Time Notied: 10/31/18 04:16  Provider Notified: Warren Lacy RN   Orders Received/Actions taken: RN will notify MD. Waiting for orders.

## 2018-10-31 NOTE — Progress Notes (Signed)
[place] Progress Note Patient Name: CARRISSA TAITANO DOB: 1965/11/03 MRN: [id number]   Date of Service  10/31/2018  HPI/Events of Note  Notified of minimal epistaxis. On review, patient is on full dose anticoagulation for history of PE previously on xarelto. INR 2.4.  eICU Interventions  Consider holding enoxaparin if epistaxis persistent especially since INR theraperutic     Intervention Category Intermediate Interventions: Bleeding - evaluation and treatment with blood products  Judd Lien 10/31/2018, 12:13 AM

## 2018-10-31 NOTE — Progress Notes (Signed)
[place] Progress Note Patient Name: Sarah [name] DOB: 11/30/1965 MRN: [id number]   Date of Service  10/31/2018  HPI/Events of Note  Notified of ABG result 7.58/24/79 on volume AC 24/570/40%/8 PEEP  eICU Interventions   Patient not breathing over set rate, decrease rate to 20  Hold bicarb drip for now     Intervention Category Major Interventions: Acid-Base disturbance - evaluation and management  Judd Lien 10/31/2018, 6:13 AM

## 2018-10-31 NOTE — Progress Notes (Signed)
[place] Progress Note Patient Name: Sarah [name] DOB: July 08, 1965 MRN: [id number]   Date of Service  10/31/2018  HPI/Events of Note  Notified of low Phos  <1 as well as decreased H/H  eICU Interventions   Replaced phos  Repeat CBC as all cell lines decreased and will confirm if this is in error     Intervention Category Major Interventions: Electrolyte abnormality - evaluation and management Intermediate Interventions: Bleeding - evaluation and treatment with blood products  Judd Lien 10/31/2018, 4:48 AM

## 2018-10-31 NOTE — Progress Notes (Addendum)
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    History of present illness   She was initially admitted to [facility] 8/3 from [facility] for increased work of breathing, diagnosed with pneumonia.  Was also found to have a catheter associated UTI, with colonies growing morganella and Ecoli.  She was on vancomycin and zosyn, initially, then transitioned to meropenem 8/6 due to better susceptibilities.  During admission, she was also started on treatment for esophageal candidiasis with fluconazole.  She has difficulty clearing secretions, and found to be high risk for aspiration a modified barium swallow 8/7, and made NPO.    This evening, patient had increasing oxygen requirements and increased work of breathing while on the floor.  She was on 3L up until yesterday, when she was increased to 6L all day today, then further increased to 15L this evening.  Found to have worsening left sided mucus plugging vs consolidation.    On arrival to [facility] ICU, patient is lethargic but oriented and responsive to voice, is having phenylephrine running through peripheral IV at 30, and is satting 99% on 15L O2.    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  Had craniectomy 3/202 History of PE, on xarelto Sacral wounds, s/p debridement of sacral ulcer 08/10/2018, CT negative for osteomyelitis   Significant Hospital Events     Consults:  Palliative care  Procedures:  10/30/2018 intubation>>    Significant Diagnostic Tests:  ABG 7.208/48/81   Micro Data:  Urine culture 8/03: Morganella and E coli 10/30/2018 BAL showing rare yeast>> 10/30/2018 blood cultures x2>>  Antimicrobials:  Vanc/zosyn 8/3-8/5 Meropenem 8/6- Fluconazole 8/7- Vancomycin 10/30/2018 discontinued on 10/31/2018 Subjective: Sedated on mechanical ventilatory support  Objective   Blood pressure (!) 153/64, pulse 67, temperature 98.4 F (36.9 C), resp. rate 20, height _0  (1.803 m), weight 89.7 kg, last menstrual period 10/22/2016, SpO2 98 %. CVP:  [8 mmHg] 8 mmHg  Vent Mode: PRVC FiO2 (%):  [40 %-50 %] 40 % Set Rate:  [20 bmp-24 bmp] 20 bmp Vt Set:  [570 mL] 570 mL PEEP:  [8 cmH20] 8 cmH20 Plateau Pressure:  [22 cmH20-36 cmH20] 22 cmH20   Intake/Output Summary (Last 24 hours) at 10/31/2018 0820 Last data filed at 10/31/2018 0700 Gross per 24 hour  Intake 5125.31 ml  Output 614 ml  Net 4511.31 ml   Filed Weights   10/30/18 0000 10/30/18 0434 10/31/18 0410  Weight: 89.9 kg 89.9 kg 89.7 kg   Examination: General: Ill-appearing 53 year old female appears much older than stated age 13: Left nares gastric tube noted mild bleeding from nares questionable for Neuro: Currently sedated on mechanical ventilatory support CV: Heart sounds regular in rate rhythm PULM: Coarse rhonchi bilaterally GI: soft, bsx4 active.  Tube feedings in place Extremities: Cool with positive edema Skin: no rashes or lesions  10/31/2018 chest x-ray reviewed   Resolved Hospital Problem list     Assessment & Plan:  53 yo F with a history of paraplegia due to metastatic Hurthle cell cancer, sacral wounds, chronic hypotension,  chronic indwelling foley catheter, who transferred from [facility] for worsening hypoxic respiratory failure due top left sided mucus plugging vs LLL consoildation, and worsening hypotension.    Acute hypoxic respiratory failure, now with respiratory alkalosis Mucus plugging and aspiration pneumonitis are both at play, weak cough.  We discussed with patient  about trialing bipap or intubating. .   Intubated 10/30/2018 Continue full vent support Tracheostomy or PEG tube per mother who is POA Fiberoptic bronchoscopy with BAL sent 10/30/2018 Continue meropenem.  Vancomycin stopped 10/31/2018- MRSA.  Shock Mild hypotension, in the setting of chronic hypotension (thought to be due to autonomic dysfunction from paraplegia).  Currently on low dose phenylephrine.  Treat for septic shock. Currently stress dose steroids Wean pressors as able Antibiotics  Hyperglycemia CBG (last 3)  Recent Labs    10/31/18 0006 10/31/18 0332 10/31/18 0710  GLUCAP 147* 255* 182*   Sliding-scale insulin protocol   UTI Morganella and Ecoli, currently on meropenem, started 8/6.  Currently on meropenem Follow culture data DC vancomycin 10/31/1998   Hypokalemia  Recent Labs  Lab 10/30/18 2034 10/31/18 0253 10/31/18 0502  K 3.3* 4.0 3.5    Replete electrolytes as needed -year-old female  Esophageal candidiasis Fluconazole started on 10/29/2018, noted to have yeast on BAL  Chronic issues: Myelopathy and paraplegia: metastatic spinal involvement, s/p resection last in 08/2017 Hypothyroidism: TSH ~6 in June Continue IV Synthroid IV change over to p.o. she has a feeding tube in place   History of PE: On xarelto at home.  Will continue therapeutic lovenox here.   Lovenox 90 mg twice daily Note she is having some small amount of bleeding from nares continue to monitor may need to decrease anticoagulation in view  Hurthle Cell carcinoma: follows with [name]  GOC: Physician spoke with the mother who is power of attorney she is no limited CODE BLUE with no CPR or cardioversion trach or PEG short-term intubation only.   Best practice:  Diet: Tube feeding Pain/Anxiety/Delirium protocol (if indicated): Sedation protocol for tube collar VAP protocol (if indicated): Yes DVT prophylaxis: lovenox, therapeutic GI prophylaxis: PPI Glucose control: SSI Mobility:  Currently on bedrest Code Status: Full code Family Communication: 10/31/2018 no family at bedside Disposition:   Labs   CBC: Recent Labs  Lab 10/23/2018 1656  10/27/18 0546 10/29/18 2141  10/30/18 0143  10/30/18 0556 10/30/18 1203 10/31/18 0253 10/31/18 0455 10/31/18 0502  WBC 6.9   < > 11.2* 10.2  --  26.4*  --  26.9*  --  13.2* 11.3*  --   NEUTROABS 6.0  --  10.4*  --   --   --   --   --   --   --   --   --   HGB 8.3*   < > 7.2* 8.4*   < > 8.4*   < > 9.7* 8.8* 7.3* 8.1* 7.1*  HCT 28.5*   < > 24.4* 28.5*   < > 27.5*   < > 32.5* 26.0* 22.1* 24.8* 21.0*  MCV 100.0   < > 97.6 100.4*  --  97.5  --  100.0  --  90.2 90.8  --   PLT 199   < > 188 175  --  351  --  364  --  129* 133*  --    < > = values in this interval not displayed.    Basic Metabolic Panel: Recent Labs  Lab 10/30/18 0143  10/30/18 0556 10/30/18 1203 10/30/18 1405 10/30/18 2034 10/31/18  0253 10/31/18 0502  NA 144   < > 147* 147* 147* 147* 146* 148*  K 2.6*   < > 3.0* 2.6* 2.4* 3.3* 4.0 3.5  CL 111  --  112*  --  113* 111 105  --   CO2 16*  --  16*  --  16* 23 27  --   GLUCOSE 117*  --  119*  --  137* 254* 336*  --   BUN 27*  --  30*  --  31* 31* 32*  --   CREATININE 1.43*  --  1.41*  --  1.50* 1.31* 1.29*  --   CALCIUM 7.8*  --  7.9*  --  7.4* 7.0* 6.5*  --   MG  --   --   --   --   --   --  2.1  --   PHOS  --   --   --   --   --   --  <1.0*  --    < > = values in this interval not displayed.   GFR: Estimated Creatinine Clearance: 63.1 mL/min (A) (by C-G formula based on SCr of 1.29 mg/dL (H)). Recent Labs  Lab 10/31/2018 1656 10/26/18 2878 10/27/18 0546 10/28/18 0440  10/29/18 2142 10/30/18 0142 10/30/18 0143 10/30/18 0556 10/30/18 0557 10/31/18 0253 10/31/18 0455  PROCALCITON  --  6.09 6.26 4.70  --   --   --   --   --   --   --   --   WBC 6.9 12.7* 11.2*  --    < >  --   --  26.4* 26.9*  --  13.2* 11.3*  LATICACIDVEN 0.6  --   --   --   --  1.0 1.2  --   --  1.5  --   --    < > = values in  this interval not displayed.    Liver Function Tests: Recent Labs  Lab 11/21/2018 1656 10/26/18 0607 10/27/18 0546 10/29/18 2141 10/30/18 0143  AST 130* 92* 54* 25 29  ALT _0 ALKPHOS 178* 156* 122 101 114  BILITOT 1.0 1.9* 1.2 1.3* 1.7*  PROT 6.0* 5.7* 5.2* 5.5* 5.3*  ALBUMIN 2.3* 2.2* 1.9* 1.8* 1.6*   No results for input(s): LIPASE, AMYLASE in the last 168 hours. No results for input(s): AMMONIA in the last 168 hours.  ABG    Component Value Date/Time   PHART 7.581 (H) 10/31/2018 0502   PCO2ART 24.1 (L) 10/31/2018 0502   PO2ART 79.0 (L) 10/31/2018 0502   HCO3 22.6 10/31/2018 0502   TCO2 23 10/31/2018 0502   ACIDBASEDEF 8.0 (H) 10/30/2018 1203   O2SAT 98.0 10/31/2018 0502     Coagulation Profile: Recent Labs  Lab 11/05/2018 1656 10/30/18 0143  INR 2.5* 2.5*    Cardiac Enzymes: No results for input(s): CKTOTAL, CKMB, CKMBINDEX, TROPONINI in the last 168 hours.  HbA1C: Hgb A1c MFr Bld  Date/Time Value Ref Range Status  10/30/2018 05:57 AM 4.5 (L) 4.8 - 5.6 % Final    Comment:    (NOTE) Pre diabetes:          5.7%-6.4% Diabetes:              >6.4% Glycemic control for   <7.0% adults with diabetes   08/09/2017 10:52 AM 5.5 4.8 - 5.6 % Final    Comment:    (NOTE) Pre diabetes:  5.7%-6.4% Diabetes:              >6.4% Glycemic control for   <7.0% adults with diabetes     CBG: Recent Labs  Lab 10/30/18 1506 10/30/18 1939 10/31/18 0006 10/31/18 0332 10/31/18 0710  GLUCAP 134* 179* 147* 255* 182*   App cct 36 min   Richardson Landry Minor ACNP Maryanna Shape PCCM Pager [phone] till 1 pm If no answer page [phone] 10/31/2018, 8:20 AM  Attending Note:  54 year old female with history of thyroid cancer with mets tot he spine and paraplegia and chronic hypotension who presents to PCCM with respiratory failure and septic shock.  Patient was intubated on 10/30/18 for altered mental status and inability to protect her airway.  Left nostril  bleeding this AM.  On exam, she remains lethargic on minimal sedation and requiring full vent support with clear lungs now after bronch yesterday.  I reviewed CXR myself, ETT ok and atelectasis resolved.  Will maintain on the vent for now.  Accept SBP of 90 given chronic hypotension.  Replace electrolytes.  BMET in AM.  Add free water.  D/C bicarb drip.  LCB.  Will need to consult palliative in AM as I do not believe patient will do well.  The patient is critically ill with multiple organ systems failure and requires high complexity decision making for assessment and support, frequent evaluation and titration of therapies, application of advanced monitoring technologies and extensive interpretation of multiple databases.   Critical Care Time devoted to patient care services described in this note is  33  Minutes. This time reflects time of care of this signee Dr [name]. This critical care time does not reflect procedure time, or teaching time or supervisory time of PA/NP/Med student/Med Resident etc but could involve care discussion time.  Rush Farmer, M.D. [facility] Pulmonary/Critical Care Medicine. Pager: [phone]. After hours pager: [phone].

## 2018-10-31 NOTE — Progress Notes (Signed)
[facility] Progress Note Patient Name: RUBERTA HOLCK DOB: 1965/05/22 MRN: [id number]   Date of Service  10/31/2018  HPI/Events of Note  Agitation - Request to renew restraint orders.  eICU Interventions  Will renew restraint orders.      Intervention Category Major Interventions: Delirium, psychosis, severe agitation - evaluation and management  Nyeli Holtmeyer [place] 10/31/2018, 9:41 PM

## 2018-11-01 ENCOUNTER — Inpatient Hospital Stay (HOSPITAL_COMMUNITY): Payer: Medicare Other

## 2018-11-01 DIAGNOSIS — T17908A Unspecified foreign body in respiratory tract, part unspecified causing other injury, initial encounter: Secondary | ICD-10-CM

## 2018-11-01 DIAGNOSIS — C73 Malignant neoplasm of thyroid gland: Secondary | ICD-10-CM

## 2018-11-01 DIAGNOSIS — Z515 Encounter for palliative care: Secondary | ICD-10-CM

## 2018-11-01 LAB — CBC WITH DIFFERENTIAL/PLATELET
Abs Immature Granulocytes: 0.42 10*3/uL — ABNORMAL HIGH (ref 0.00–0.07)
Basophils Absolute: 0 10*3/uL (ref 0.0–0.1)
Basophils Relative: 0 %
Eosinophils Absolute: 0 10*3/uL (ref 0.0–0.5)
Eosinophils Relative: 0 %
HCT: 21.5 % — ABNORMAL LOW (ref 36.0–46.0)
Hemoglobin: 7 g/dL — ABNORMAL LOW (ref 12.0–15.0)
Immature Granulocytes: 4 %
Lymphocytes Relative: 4 %
Lymphs Abs: 0.5 10*3/uL — ABNORMAL LOW (ref 0.7–4.0)
MCH: 29.5 pg (ref 26.0–34.0)
MCHC: 32.6 g/dL (ref 30.0–36.0)
MCV: 90.7 fL (ref 80.0–100.0)
Monocytes Absolute: 0.1 10*3/uL (ref 0.1–1.0)
Monocytes Relative: 1 %
Neutro Abs: 10.6 10*3/uL — ABNORMAL HIGH (ref 1.7–7.7)
Neutrophils Relative %: 91 %
Platelets: 67 10*3/uL — ABNORMAL LOW (ref 150–400)
RBC: 2.37 MIL/uL — ABNORMAL LOW (ref 3.87–5.11)
RDW: 19.9 % — ABNORMAL HIGH (ref 11.5–15.5)
WBC: 11.7 10*3/uL — ABNORMAL HIGH (ref 4.0–10.5)
nRBC: 0.3 % — ABNORMAL HIGH (ref 0.0–0.2)

## 2018-11-01 LAB — BASIC METABOLIC PANEL
Anion gap: 10 (ref 5–15)
BUN: 40 mg/dL — ABNORMAL HIGH (ref 6–20)
CO2: 26 mmol/L (ref 22–32)
Calcium: 7.3 mg/dL — ABNORMAL LOW (ref 8.9–10.3)
Chloride: 112 mmol/L — ABNORMAL HIGH (ref 98–111)
Creatinine, Ser: 0.85 mg/dL (ref 0.44–1.00)
GFR calc Af Amer: >60 mL/min (ref >60)
GFR calc non Af Amer: >60 mL/min (ref >60)
Glucose, Bld: 249 mg/dL — ABNORMAL HIGH (ref 70–99)
Potassium: 3.7 mmol/L (ref 3.5–5.1)
Sodium: 148 mmol/L — ABNORMAL HIGH (ref 135–145)

## 2018-11-01 LAB — POCT I-STAT 7, (LYTES, BLD GAS, ICA,H+H)
Acid-Base Excess: 3 mmol/L — ABNORMAL HIGH (ref 0.0–2.0)
Acid-Base Excess: 3 mmol/L — ABNORMAL HIGH (ref 0.0–2.0)
Bicarbonate: 26.4 mmol/L (ref 20.0–28.0)
Bicarbonate: 26.7 mmol/L (ref 20.0–28.0)
Calcium, Ion: 1.11 mmol/L — ABNORMAL LOW (ref 1.15–1.40)
Calcium, Ion: 1.14 mmol/L — ABNORMAL LOW (ref 1.15–1.40)
HCT: 24 % — ABNORMAL LOW (ref 36.0–46.0)
HCT: 19 % — ABNORMAL LOW (ref 36.0–46.0)
Hemoglobin: 8.2 g/dL — ABNORMAL LOW (ref 12.0–15.0)
Hemoglobin: 6.5 g/dL — CL (ref 12.0–15.0)
O2 Saturation: 95 %
O2 Saturation: 91 %
Patient temperature: 36.6
Potassium: 3.5 mmol/L (ref 3.5–5.1)
Potassium: 3.3 mmol/L — ABNORMAL LOW (ref 3.5–5.1)
Sodium: 150 mmol/L — ABNORMAL HIGH (ref 135–145)
Sodium: 151 mmol/L — ABNORMAL HIGH (ref 135–145)
TCO2: 27 mmol/L (ref 22–32)
TCO2: 28 mmol/L (ref 22–32)
pCO2 arterial: 32.8 mmHg (ref 32.0–48.0)
pCO2 arterial: 34.7 mmHg (ref 32.0–48.0)
pH, Arterial: 7.512 — ABNORMAL HIGH (ref 7.350–7.450)
pH, Arterial: 7.494 — ABNORMAL HIGH (ref 7.350–7.450)
pO2, Arterial: 64 mmHg — ABNORMAL LOW (ref 83.0–108.0)
pO2, Arterial: 56 mmHg — ABNORMAL LOW (ref 83.0–108.0)

## 2018-11-01 LAB — MAGNESIUM: Magnesium: 2.2 mg/dL (ref 1.7–2.4)

## 2018-11-01 LAB — PHOSPHORUS: Phosphorus: 1 mg/dL — CL (ref 2.5–4.6)

## 2018-11-01 LAB — HEMOGLOBIN AND HEMATOCRIT, BLOOD
HCT: 27.5 % — ABNORMAL LOW (ref 36.0–46.0)
Hemoglobin: 9.1 g/dL — ABNORMAL LOW (ref 12.0–15.0)

## 2018-11-01 LAB — GLUCOSE, CAPILLARY
Glucose-Capillary: 223 mg/dL — ABNORMAL HIGH (ref 70–99)
Glucose-Capillary: 192 mg/dL — ABNORMAL HIGH (ref 70–99)
Glucose-Capillary: 182 mg/dL — ABNORMAL HIGH (ref 70–99)
Glucose-Capillary: 199 mg/dL — ABNORMAL HIGH (ref 70–99)
Glucose-Capillary: 119 mg/dL — ABNORMAL HIGH (ref 70–99)

## 2018-11-01 LAB — PREPARE RBC (CROSSMATCH)

## 2018-11-01 MED ORDER — POTASSIUM CHLORIDE 20 MEQ/15ML (10%) PO SOLN: 40.0000 meq | Freq: Once | ORAL | Status: DC

## 2018-11-01 MED ORDER — SODIUM CHLORIDE 0.9% FLUSH
3.0000 mL | Freq: Two times a day (BID) | INTRAVENOUS | Status: DC
  Administered 2018-11-02: 10:00:00 10 mL via INTRAVENOUS
  Administered 2018-11-02 – 2018-11-03 (×3): 3 mL via INTRAVENOUS

## 2018-11-01 MED ORDER — POTASSIUM CHLORIDE 20 MEQ/15ML (10%) PO SOLN
40.0000 meq | Freq: Three times a day (TID) | ORAL | Status: AC
  Administered 2018-11-01 (×2): 40 meq via ORAL
  Filled 2018-11-01 (×2): qty 30

## 2018-11-01 MED ORDER — HYDRALAZINE HCL 20 MG/ML IJ SOLN
10.0000 mg | INTRAMUSCULAR | Status: DC | PRN
  Administered 2018-11-01 – 2018-11-02 (×2): 10 mg via INTRAVENOUS
  Filled 2018-11-01 (×2): qty 1

## 2018-11-01 MED ORDER — SODIUM CHLORIDE 0.9% IV SOLUTION
Freq: Once | INTRAVENOUS | Status: AC
  Administered 2018-11-01: 15:00:00 via INTRAVENOUS

## 2018-11-01 MED ORDER — SODIUM PHOSPHATES 45 MMOLE/15ML IV SOLN
30.0000 mmol | Freq: Once | INTRAVENOUS | Status: DC
  Filled 2018-11-01: qty 10

## 2018-11-01 MED ORDER — SODIUM CHLORIDE 0.9% FLUSH: 3.0000 mL | INTRAVENOUS | Status: DC | PRN

## 2018-11-01 MED ORDER — SODIUM CHLORIDE 0.9 % IV SOLN: 250.0000 mL | INTRAVENOUS | Status: DC | PRN

## 2018-11-01 MED ORDER — POTASSIUM PHOSPHATES 15 MMOLE/5ML IV SOLN
30.0000 mmol | Freq: Once | INTRAVENOUS | Status: AC
  Administered 2018-11-01: 12:00:00 30 mmol via INTRAVENOUS
  Filled 2018-11-01: qty 10

## 2018-11-01 MED ORDER — FUROSEMIDE 10 MG/ML IJ SOLN
40.0000 mg | Freq: Four times a day (QID) | INTRAMUSCULAR | Status: AC
  Administered 2018-11-01 (×3): 40 mg via INTRAVENOUS
  Filled 2018-11-01 (×3): qty 4

## 2018-11-01 MED ORDER — FREE WATER
350.0000 mL | Status: DC
  Administered 2018-11-01 – 2018-11-04 (×18): 350 mL

## 2018-11-01 NOTE — Progress Notes (Signed)
Hgb 7.0 and Plt 67 d/w EMD. Will refer to primary MD for replacement if appropriate

## 2018-11-01 NOTE — Progress Notes (Signed)
Sarah [name] for Lovenox Indication: history of VTE  Allergies  Allergen Reactions  . Bee Venom Anaphylaxis  . Heparin Other (See Comments)    Weak positive platelets induced antibodies, SRA negative--2015  . Keflex [Cephalexin] Nausea And Vomiting  . Tramadol Nausea And Vomiting  . Gabapentin Nausea And Vomiting  . Hydrochlorothiazide Nausea And Vomiting  . Hydrocodone Nausea And Vomiting  . Oxycodone-Acetaminophen Nausea And Vomiting  . Sorbitol Nausea Only    And abdominal distress  . Tizanidine     Fatigue     Patient Measurements: Height: 5\' 11"  (180.3 cm) Weight: 197 lb 8.5 oz (89.6 kg) IBW/kg (Calculated) : 70.8   Vital Signs: Temp: 98.1 F (36.7 C) (08/10 0600) BP: 156/68 (08/10 0716) Pulse Rate: 66 (08/10 0716)  Labs: Recent Labs    10/29/18 2141  10/30/18 0143  10/30/18 2034 10/31/18 0253 10/31/18 0455  11/01/18 0325 11/01/18 0336 11/01/18 0728  HGB 8.4*   < > 8.4*   < >  --  7.3* 8.1*   < > 7.0* 8.2* 6.5*  HCT 28.5*   < > 27.5*   < >  --  22.1* 24.8*   < > 21.5* 24.0* 19.0*  PLT 175  --  351   < >  --  129* 133*  --  67*  --   --   LABPROT  --   --  26.3*  --   --   --   --   --   --   --   --   INR  --   --  2.5*  --   --   --   --   --   --   --   --   CREATININE 1.07*  --  1.43*   < > 1.31* 1.29*  --   --  0.85  --   --   TROPONINIHS 3.1  --   --   --   --   --   --   --   --   --   --    < > = values in this interval not displayed.    Estimated Creatinine Clearance: 95.7 mL/min (by C-G formula based on SCr of 0.85 mg/dL).   Medical History: Past Medical History:  Diagnosis Date  . Anxiety   . Cancer [facility])    FNA of thyroid positive for onconytic/hurthle cell carcinoma  . Chronic back pain   . Complication of anesthesia    Nausea and vomiting  . DDD (degenerative disc disease), cervical   . Depression   . DJD (degenerative joint disease)   . Family history of adverse reaction to anesthesia    MOTHER HAS NAUSEA   . GERD (gastroesophageal reflux disease)   . History of blood transfusion    08/2017  . History of rectal fissure   . HIT (heparin-induced thrombocytopenia) ([place])   . Hypertension    05/27/2018- not current- runs low  . Hypothyroidism   . Obesity   . Paraplegia at T4 level ([place])   . Pneumonia 03/2018  . Pulmonary embolus, right ([place]) 2015  . Rotator cuff tendonitis right    Medications:  Medications Prior to Admission  Medication Sig Dispense Refill Last Dose  . acetaminophen (TYLENOL) 650 MG CR tablet Take 1,300 mg by mouth every 6 (six) hours as needed for pain.   unknown  . Amino Acids-Protein Hydrolys (PRO-STAT PO) Take 30 mLs by mouth 2 (two) times  daily.   11/13/2018 at Unknown time  . baclofen (LIORESAL) 20 MG tablet Take 1 tablet (20 mg total) by mouth 3 (three) times daily. 30 each 0 11/07/2018 at Unknown time  . bisacodyl (DULCOLAX) 10 MG suppository Place 1 suppository (10 mg total) rectally daily. (Patient taking differently: Place 10 mg rectally daily as needed for mild constipation or moderate constipation. ) 12 suppository 0 unknown  . diclofenac sodium (VOLTAREN) 1 % GEL Apply 2 g topically 3 (three) times daily. (Patient taking differently: Apply 2 g topically 2 (two) times daily. Left shoulder)   10/26/2018 at Unknown time  . ferrous sulfate 325 (65 FE) MG tablet Take 1 tablet (325 mg total) by mouth every other day.  3 10/24/2018 at Unknown time  . fludrocortisone (FLORINEF) 0.1 MG tablet Take 1 tablet (0.1 mg total) by mouth daily.   11/19/2018 at Unknown time  . fluticasone (FLONASE) 50 MCG/ACT nasal spray Place 1 spray into both nostrils daily.   10/26/2018 at Unknown time  . guaifenesin (HUMIBID E) 400 MG TABS tablet Take 400 mg by mouth 3 (three) times daily. 14 day course starting on 10/24/2018   10/26/2018 at Unknown time  . ipratropium-albuterol (DUONEB) 0.5-2.5 (3) MG/3ML SOLN Take 3 mLs by nebulization every 8 (eight) hours. *May inhale every 6 hours as  needed for shortness of breath/wheezing   10/23/2018 at Unknown time  . levothyroxine (SYNTHROID) 150 MCG tablet Take 1 tablet (150 mcg total) by mouth daily before breakfast. 30 tablet 1 11/15/2018 at Unknown time  . loratadine (CLARITIN) 10 MG tablet Take 10 mg by mouth daily.   10/24/2018 at Unknown time  . Multiple Vitamin (MULTIVITAMIN WITH MINERALS) TABS tablet Take 1 tablet by mouth daily.   11/10/2018 at Unknown time  . ondansetron (ZOFRAN) 4 MG tablet Take 4 mg by mouth every 6 (six) hours as needed for nausea or vomiting.   11/06/2018 at 1204  . oxyCODONE (ROXICODONE) 5 MG immediate release tablet Take 1 tablet (5 mg total) by mouth every 6 (six) hours as needed for severe pain. 6 tablet 0 11/22/2018 at 1345  . pantoprazole (PROTONIX) 40 MG tablet Take 1 tablet (40 mg total) by mouth daily. (Patient taking differently: Take 40 mg by mouth daily. ) 30 tablet 0 11/17/2018 at Unknown time  . polyethylene glycol (MIRALAX / GLYCOLAX) 17 g packet Take 17 g by mouth daily. 14 each 0 11/10/2018 at Unknown time  . rivaroxaban (XARELTO) 20 MG TABS tablet Take 1 tablet (20 mg total) by mouth daily with supper. (Patient taking differently: Take 20 mg by mouth every morning. ) 30 tablet 3 11/12/2018 at 900  . sertraline (ZOLOFT) 50 MG tablet Take 50 mg by mouth daily.    11/03/2018 at Unknown time    Assessment: 45 yof with hx of VTE on Xarelto PTA, currently on Lovenox inpatient. Patient has listed allergy to heparin but SRA was negative, so did not confirm HIT diagnosis. Hg dropped to 6.5 this AM - to transfuse, plt dropped further to 67. Per Dr. Nelda Marseille, he would like to continue Lovenox for now and send HIT Ab. Will monitor CBC trend closely. SCr improved to 0.85.  Goal of Therapy:  VTE treatment/prevention Monitor platelets by anticoagulation protocol: Yes   Plan:  Lovenox 90 mg [place] q12h Monitor CBC trend closely - may need to hold if trends down further per CCM, s/sx bleeding F/u HIT Ab  Elicia Lamp, PharmD,  BCPS Please check AMION for all [place]  contact numbers Clinical Pharmacist 11/01/2018 10:09 AM

## 2018-11-01 NOTE — Progress Notes (Addendum)
Daily Progress Note   Patient Name: Sarah [name]       Date: 10/30/2018 DOB: 12-03-1965  Age: 53 y.o. MRN#: [id number] Attending Physician: Rush Farmer, MD Primary Care Physician: Daisy Floro, DO Admit Date: 11/09/2018  Reason for Consultation/Follow-up: Establishing goals of care  Subjective: Epic chart reviewed.  Patient seen in the ICU.  She is awake and bucking the vent.  The RN and I attempted to calm her.  She kept shaking her head "No".  I asked what was bothering her - was it the bair hugger?  She indicated it was not.  Is it the tube?  Patient pointed to the ETT tube in her mouth and vigorously shakes her head indicating that she did not want the tube in her mouth/throat.   I attempted to explain that her body needed help breathing right now and without the tube she would pass away.  She continued to point at the tube and shake her head indicating that she did not want it.    Discussed with RN.  We are uncertain that patient understands the implications of not having the tube at this point.  Patient was seen in Palliative consultation by [name], NP PMT at [facility], but patient was unwilling or unable to have a goals of care discussion while at [facility].  Assessment: critical condition, respiratory failure secondary to recurrent aspiration.  Agitated on the ventilator.    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 She has neurogenic bowel and bladder as well as dysphagia.  She has had an infected sacral decubitus as well as PE (on Xeralto). She was admitted from [facility] ith pneumonia to [facility] on 8/3 and a Palliative consult was placed.   Developed worsening respiratory failure was intubated and transferred to [facility] for bronchoscopy.  This was done by Dr. Nelda Marseille who explained to me that the lung was full of puss and the patient was aspirating extensively.  She has developed autonomic dis-regulation and her body is unable to control her BP and Temp.   Length of Stay: 7  Current Medications: Scheduled Meds:  . acetylcysteine  4 mL Nebulization Q6H  . bisacodyl  10 mg  Rectal Daily  . chlorhexidine  15 mL Mouth Rinse BID  . Chlorhexidine Gluconate Cloth  6 each Topical Q0600  . Chlorhexidine Gluconate Cloth  6 each Topical Daily  . enoxaparin (LOVENOX) injection  90 mg Subcutaneous Q12H  . feeding supplement (PRO-STAT SUGAR FREE 64)  30 mL Per Tube TID  . fluticasone  1 spray Each Nare Daily  . free water  250 mL Per Tube Q6H  . hydrocortisone sod succinate (SOLU-CORTEF) inj  100 mg Intravenous Q8H  . insulin aspart  0-15 Units Subcutaneous Q4H  . levalbuterol  1.25 mg Nebulization Q6H  . levothyroxine  150 mcg Per Tube Q0600  . mouth rinse  15 mL Mouth Rinse q12n4p  . mouth rinse  15 mL Mouth Rinse 10 times per day  . nutrition supplement (JUVEN)  1 packet Per Tube BID BM  . pantoprazole sodium  40 mg Per Tube Q1200  . sodium chloride flush  3 mL Intravenous Q12H    Continuous Infusions: . sodium chloride 10 mL/hr at 11/01/18 0600  . dexmedetomidine (PRECEDEX) IV infusion 1 mcg/kg/hr (11/01/18 0852)  . feeding supplement (VITAL 1.5 CAL) 45 mL/hr at 11/01/18 0600  . fluconazole (DIFLUCAN) IV Stopped (10/31/18 1611)  . meropenem (MERREM) IV 1 g (11/01/18 0832)  . methocarbamol (ROBAXIN) IV    . phenylephrine (NEO-SYNEPHRINE) Adult infusion Stopped (10/31/18 2306)  . sodium phosphate  Dextrose 5% IVPB      PRN Meds: sodium chloride, acetaminophen **OR** acetaminophen, fentaNYL (SUBLIMAZE) injection, levalbuterol, LORazepam, methocarbamol (ROBAXIN) IV, ondansetron **OR** ondansetron (ZOFRAN) IV, ondansetron,  oxymetazoline, phenol, sodium chloride flush, sodium chloride flush  Physical Exam       Well developed female, awake, agitated, intubated.  I'm unable to assess her capacity, but she is making eye contact and answering questions by moving her head.  Vital Signs: BP (!) 156/68   Pulse 66   Temp 98.1 F (36.7 C)   Resp (!) 22   Ht 5\' 11"  (1.803 m)   Wt 89.6 kg   LMP 10/22/2016 (LMP Unknown)   SpO2 98%   BMI 27.55 kg/m  SpO2: SpO2: 98 % O2 Device: O2 Device: Ventilator O2 Flow Rate: O2 Flow Rate (L/min): 50 L/min  Intake/output summary:   Intake/Output Summary (Last 24 hours) at 11/01/2018 0920 Last data filed at 11/01/2018 0600 Gross per 24 hour  Intake 2318.05 ml  Output 1240 ml  Net 1078.05 ml   LBM: Last BM Date: 10/30/18 Baseline Weight: Weight: 87.1 kg Most recent weight: Weight: 89.6 kg       Palliative Assessment/Data: 10%      Patient Active Problem List   Diagnosis Date Noted  . Acute on chronic respiratory failure ([place]) 10/30/2018  . Healthcare-associated pneumonia   . Pressure injury of skin 10/27/2018  . Lobar pneumonia ([place]) 10/26/2018  . Acute respiratory failure with hypoxia ([place]) 10/26/2018  . Bilateral/multifocal PNA (pneumonia) 11/20/2018  . CKD (chronic kidney disease), stage III ([place])   . Hypoalbuminemia due to protein-calorie malnutrition ([place])   . Hyperglycemia   . Nasal congestion   . Hypotension   . Acute on chronic anemia   . Febrile illness   . Bacterial skin infection 08/10/2018  . Eustachian tube disorder, left 06/30/2018  . Urinary tract infection 06/30/2018  . Malignant neoplasm ([place]) 06/01/2018  . Metastasis to brain ([place]) 06/01/2018  . Transaminitis 04/09/2018  . Hypoalbuminemia 04/09/2018  . Dyspnea   . Multifocal pneumonia   . Palliative care by specialist   .  Goals of care, counseling/discussion   . DNR (do not resuscitate) discussion   . Fecal impaction ([place]) 02/06/2018  . HCAP (healthcare-associated pneumonia)  02/06/2018  . Anemia   . Chronic anticoagulation   . Sacral ulcer ([place]) 02/04/2018  . Fracture   . Acute lower UTI   . Myelopathy ([place]) 09/21/2017  . Muscle spasms of both lower extremities   . Closed fracture of shaft of left femur ([place])   . Acute blood loss anemia   . Hypothyroidism   . Nausea & vomiting 09/15/2017  . Closed fracture of left distal femur ([place]) 09/11/2017  . Paraplegia ([place]) 09/08/2017  . Obesity (BMI 30-39.9) 09/03/2017  . Status post surgery 09/03/2017  . Calculus of gallbladder without cholecystitis without obstruction   . Biliary dyskinesia   . Intractable nausea and vomiting 08/09/2017  . Thrombocytopenia ([place]) 08/09/2017  . Elevated bilirubin 08/09/2017  . AKI (acute kidney injury) ([place]) 08/09/2017  . Chest pain 01/26/2017  . Upper respiratory infection, viral 01/26/2017  . Counseling regarding advanced care planning and goals of care 01/02/2017  . Bilateral rotator cuff syndrome 02/12/2016  . E. coli UTI 08/01/2015  . Constipation due to neurogenic bowel 08/01/2015  . 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 Spastic paraparesis   . Neurogenic bladder   . History of pulmonary embolism   . Post-operative pain   . Metastatic cancer ([place])   . Steroid-induced hyperglycemia   . Depression   . Obstipation   . Thoracic spine tumor 07/06/2015  . 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 ([place]) 07/06/2015  . Spinal cord compression due to malignant neoplasm metastatic to spine ([place])   . Postoperative hypothyroidism   . Secondary malignant neoplasm of vertebral column ([place]) 03/08/2015  . Spasticity 09/19/2014  . Hypertension 05/01/2014  . Ingrown right big toenail 05/01/2014  . GERD (gastroesophageal reflux disease) 02/14/2014  . Dysphagia, pharyngoesophageal phase 02/06/2014  . Type 2 diabetes mellitus without complication ([place]) 81/19/1478  . Constipation   . Neurogenic bowel   . Paraplegia at T4 level ([place]) 01/20/2014  . Metastatic cancer to spine ([place])  01/19/2014  . Rotator cuff tendonitis   . 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 Hurthle cell carcinoma of thyroid ([place]) 12/29/2013  . Leg weakness, bilateral 12/13/2013    Palliative Care Plan    Recommendations/Plan:  PMT will follow with you.  Dr. Nelda Marseille has reached the patient's mother (decision maker) and she is now a partial code.  PMT called Caralyn Guile and left a detailed voice mail message requesting a call back in order to schedule a meeting.  No return call.  Goals of Care and Additional Recommendations:  Limitations on Scope of Treatment: Full Scope Treatment  Code Status:  Limited code  Prognosis:   Unable to determine.  She is at high risk of acute decline and death given respiratory failure and advanced metastatic cancer.   Discharge Planning:  To Be Determined  Care plan was discussed with RN and Dr. Nelda Marseille.  Thank you for allowing the Palliative Medicine Team to assist in the care of this patient.  Total time spent:  45 min.     Greater than 50%  of this time was spent counseling and coordinating care related to the above assessment and plan.  Florentina Jenny, PA-C Palliative Medicine  Please contact Palliative MedicineTeam phone at [phone] for questions and concerns between 7 am - 7 pm.   Please see AMION for individual provider pager numbers.

## 2018-11-01 NOTE — Progress Notes (Signed)
[place] Progress Note Patient Name: JALEE SAINE DOB: February 18, 1966 MRN: [id number]   Date of Service  11/01/2018  HPI/Events of Note  Hypophosphatemia - PO4--- = 1.0 and Creatinine = 0.85.   eICU Interventions  Will replace PO4---.     Intervention Category Major Interventions: Electrolyte abnormality - evaluation and management  Salbador Fiveash Eugene 11/01/2018, 5:12 AM

## 2018-11-01 NOTE — Progress Notes (Signed)
NAME:  Sarah [name], MRN:  [id number], DOB:  1965/10/25, LOS: 7 ADMISSION DATE:  11/12/2018, CONSULTATION DATE:  10/30/2018 REFERRING MD: Jani Gravel , CHIEF COMPLAINT:  Acute respiratory distress   Brief History   53 yo F with a history of Hurthle cell cancer of the thyroid, metastatic to the spine, paraplegia, and chronic hypotension, who presents with increased respiratory distress, and hypotension after being admitted at [facility] on 8/3.    History of present illness   She was initially admitted to [facility] 8/3 from [facility] for increased work of breathing, diagnosed with pneumonia.  Was also found to have a catheter associated UTI, with colonies growing morganella and Ecoli.  She was on vancomycin and zosyn, initially, then transitioned to meropenem 8/6 due to better susceptibilities.  During admission, she was also started on treatment for esophageal candidiasis with fluconazole.  She has difficulty clearing secretions, and found to be high risk for aspiration a modified barium swallow 8/7, and made NPO.    This evening, patient had increasing oxygen requirements and increased work of breathing while on the floor.  She was on 3L up until yesterday, when she was increased to 6L all day today, then further increased to 15L this evening.  Found to have worsening left sided mucus plugging vs consolidation.    On arrival to [facility] ICU, patient is lethargic but oriented and responsive to voice, is having phenylephrine running through peripheral IV at 30, and is satting 99% on 15L O2.    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  Had craniectomy 3/202 History of PE, on xarelto Sacral wounds, s/p debridement of sacral ulcer 08/10/2018, CT negative for osteomyelitis   Significant Hospital Events     Consults:  Palliative care  Procedures:  10/30/2018 intubation>>    Significant Diagnostic Tests:  ABG 7.208/48/81   Micro Data:  Urine culture 8/03: Morganella and E coli 10/30/2018 BAL showing rare yeast>> 10/30/2018 blood cultures x2>>  Antimicrobials:  Vanc/zosyn 8/3-8/5 Meropenem 8/6- Fluconazole 8/7- Vancomycin 10/30/2018 discontinued on 10/31/2018  Subjective: No events overnight Failed weaning  Objective   Blood pressure (!) 156/68, pulse 66, temperature 98.1 F (36.7 C), resp. rate (!) 22, height _0  (1.803 m), weight 89.6 kg, last menstrual period 10/22/2016, SpO2 98 %.    Vent Mode: PRVC FiO2 (%):  [40 %] 40 % Set Rate:  [14 bmp-18 bmp] 14 bmp Vt Set:  [570 mL] 570 mL PEEP:  [8 cmH20] 8 cmH20 Pressure Support:  [12 cmH20] 12 cmH20 Plateau Pressure:  [18 cmH20-29 cmH20] 18 cmH20   Intake/Output Summary (Last 24 hours) at 11/01/2018 0926 Last data filed at 11/01/2018 0600 Gross per 24 hour  Intake 2318.05 ml  Output 1240 ml  Net 1078.05 ml   Filed Weights   10/30/18 0434 10/31/18 0410 11/01/18 0423  Weight: 89.9 kg 89.7 kg 89.6 kg   Examination: General: Acute on chronically ill appearing female, NAD HEENT: [place]/AT, PERRL, EOM-I and MMM, ETT in place Neuro: Awake and following commands CV: RRR, Nl S1/S2 and -M/R/G PULM: Coarse BS diffusely GI: soft, bsx4 active.  Tube feedings in place Extremities: Cool with positive edema Skin: no rashes or lesions  Chest x-ray reviewed that I reviewed myself  Resolved Hospital Problem list     Assessment & Plan:  53 yo F with a history of paraplegia due to metastatic Hurthle cell cancer, sacral wounds, chronic hypotension, chronic indwelling  foley catheter, who transferred from [facility] for worsening hypoxic respiratory failure due top left sided mucus plugging vs LLL consoildation, and worsening hypotension.    Acute hypoxic respiratory failure, now with respiratory alkalosis Mucus plugging and aspiration pneumonitis are both at play, weak cough.  We discussed with patient about trialing bipap or intubating.  Intubated 10/30/2018 Begin  PS trials, hold of extubation today given mental status Tracheostomy or PEG tube per mother who is POA Fiberoptic bronchoscopy with BAL sent 10/30/2018 Continue meropenem.   Vancomycin stopped 10/31/2018 -MRSA. Would like to diurese, will increase free water and add lasix  Shock Mild hypotension, in the setting of chronic hypotension (thought to be due to autonomic dysfunction from paraplegia).  Currently on low dose phenylephrine.  Treat for septic shock. D/C stress dose steroids D/C pressors If remains hypertensive will consider starting home dose anti-HTN Antibiotics as below  Hyperglycemia CBG (last 3)  Recent Labs    10/31/18 2339 11/01/18 0335 11/01/18 0820  GLUCAP 218* 223* 192*  Sliding-scale insulin protocol  UTI Morganella and Ecoli, currently on meropenem, started 8/6.  Currently on meropenem Follow culture data DC vancomycin 10/31/1998   Hypokalemia  Recent Labs  Lab 11/01/18 0325 11/01/18 0336 11/01/18 0728  K 3.7 3.5 3.3*    Replete electrolytes as needed -year-old female  Esophageal candidiasis Fluconazole started on 10/29/2018, noted to have yeast on BAL  Chronic issues: Myelopathy and paraplegia: metastatic spinal involvement, s/p resection last in 08/2017 Hypothyroidism: TSH ~6 in June Continue IV Synthroid IV change over to p.o. she has a feeding tube in place  History of PE: On xarelto at home.  Will continue therapeutic lovenox here.   Hold  Transfuse for Hg of 6.5  Hurthle Cell carcinoma: follows with [name]  GOC: Spoke with the mother who is power of attorney she is no limited CODE BLUE with no CPR or cardioversion trach or PEG short-term intubation only.   Best practice:  Diet: Tube feeding Pain/Anxiety/Delirium protocol (if indicated): Sedation protocol for tube collar VAP protocol (if indicated): Yes DVT prophylaxis: lovenox, therapeutic GI prophylaxis: PPI Glucose control: SSI Mobility: Currently on bedrest Code Status: Full code  Family Communication: 10/31/2018 no family at bedside Disposition:   Labs   CBC: Recent Labs  Lab 11/18/2018 1656  10/27/18 0546  10/30/18 0143  10/30/18 0556  10/31/18 0253 10/31/18 0455 10/31/18 0502 10/31/18 1044 11/01/18 0325 11/01/18 0336 11/01/18 0728  WBC 6.9   < > 11.2*   < > 26.4*  --  26.9*  --  13.2* 11.3*  --   --  11.7*  --   --   NEUTROABS 6.0  --  10.4*  --   --   --   --   --   --   --   --   --  10.6*  --   --   HGB 8.3*   < > 7.2*   < > 8.4*   < > 9.7*   < > 7.3* 8.1* 7.1* 11.9* 7.0* 8.2* 6.5*  HCT 28.5*   < > 24.4*   < > 27.5*   < > 32.5*   < > 22.1* 24.8* 21.0* 35.0* 21.5* 24.0* 19.0*  MCV 100.0   < > 97.6   < > 97.5  --  100.0  --  90.2 90.8  --   --  90.7  --   --   PLT 199   < > 188   < > 351  --  364  --  129* 133*  --   --  67*  --   --    < > = values in this interval not displayed.    Basic Metabolic Panel: Recent Labs  Lab 10/30/18 0556  10/30/18 1405 10/30/18 2034 10/31/18 0253 10/31/18 0502 10/31/18 1044 11/01/18 0325 11/01/18 0336 11/01/18 0728  NA 147*   < > 147* 147* 146* 148* 150* 148* 150* 151*  K 3.0*   < > 2.4* 3.3* 4.0 3.5 3.4* 3.7 3.5 3.3*  CL 112*  --  113* 111 105  --   --  112*  --   --   CO2 16*  --  16* 23 27  --   --  26  --   --   GLUCOSE 119*  --  137* 254* 336*  --   --  249*  --   --   BUN 30*  --  31* 31* 32*  --   --  40*  --   --   CREATININE 1.41*  --  1.50* 1.31* 1.29*  --   --  0.85  --   --   CALCIUM 7.9*  --  7.4* 7.0* 6.5*  --   --  7.3*  --   --   MG  --   --   --   --  2.1  --   --  2.2  --   --   PHOS  --   --   --   --  <1.0*  --   --  1.0*  --   --    < > = values in this interval not displayed.   GFR: Estimated Creatinine Clearance: 95.7 mL/min (by C-G formula based on SCr of 0.85 mg/dL). Recent Labs  Lab 10/23/2018 1656 10/26/18 7322 10/27/18 0546 10/28/18 0440  10/29/18 2142 10/30/18 0142  10/30/18 0556 10/30/18 0557 10/31/18 0253 10/31/18 0455 11/01/18 0325  PROCALCITON  --  6.09 6.26 4.70   --   --   --   --   --   --   --   --   --   WBC 6.9 12.7* 11.2*  --    < >  --   --    < > 26.9*  --  13.2* 11.3* 11.7*  LATICACIDVEN 0.6  --   --   --   --  1.0 1.2  --   --  1.5  --   --   --    < > = values in this interval not displayed.    Liver Function Tests: Recent Labs  Lab 10/31/2018 1656 10/26/18 0607 10/27/18 0546 10/29/18 2141 10/30/18 0143  AST 130* 92* 54* 25 29  ALT _0 ALKPHOS 178* 156* 122 101 114  BILITOT 1.0 1.9* 1.2 1.3* 1.7*  PROT 6.0* 5.7* 5.2* 5.5* 5.3*  ALBUMIN 2.3* 2.2* 1.9* 1.8* 1.6*   No results for input(s): LIPASE, AMYLASE in the last 168 hours. No results for input(s): AMMONIA in the last 168 hours.  ABG    Component Value Date/Time   PHART 7.494 (H) 11/01/2018 0728   PCO2ART 34.7 11/01/2018 0728   PO2ART 56.0 (L) 11/01/2018 0728   HCO3 26.7 11/01/2018 0728   TCO2 28 11/01/2018 0728   ACIDBASEDEF 8.0 (H) 10/30/2018 1203   O2SAT 91.0 11/01/2018 0728     Coagulation Profile: Recent Labs  Lab 10/30/2018 1656 10/30/18 0143  INR 2.5* 2.5*  Cardiac Enzymes: No results for input(s): CKTOTAL, CKMB, CKMBINDEX, TROPONINI in the last 168 hours.  HbA1C: Hgb A1c MFr Bld  Date/Time Value Ref Range Status  10/30/2018 05:57 AM 4.5 (L) 4.8 - 5.6 % Final    Comment:    (NOTE) Pre diabetes:          5.7%-6.4% Diabetes:              >6.4% Glycemic control for   <7.0% adults with diabetes   08/09/2017 10:52 AM 5.5 4.8 - 5.6 % Final    Comment:    (NOTE) Pre diabetes:          5.7%-6.4% Diabetes:              >6.4% Glycemic control for   <7.0% adults with diabetes     CBG: Recent Labs  Lab 10/31/18 1508 10/31/18 1934 10/31/18 2339 11/01/18 0335 11/01/18 0820  GLUCAP 185* 194* 218* 223* 192*   The patient is critically ill with multiple organ systems failure and requires high complexity decision making for assessment and support, frequent evaluation and titration of therapies, application of advanced monitoring  technologies and extensive interpretation of multiple databases.   Critical Care Time devoted to patient care services described in this note is  32  Minutes. This time reflects time of care of this signee Dr [name]. This critical care time does not reflect procedure time, or teaching time or supervisory time of PA/NP/Med student/Med Resident etc but could involve care discussion time.  Rush Farmer, M.D. [facility] Pulmonary/Critical Care Medicine. Pager: [phone]. After hours pager: [phone].

## 2018-11-01 NOTE — Progress Notes (Signed)
CRITICAL VALUE ALERT  Critical Value: Phos 1.0  Date & Time Notied:  11/01/18  0445  Provider Notified: Margaree Mackintosh MD  Orders Received/Actions taken: Order given

## 2018-11-01 NOTE — Progress Notes (Signed)
[place] Progress Note Patient Name: ALICIA SEIB DOB: 11-Aug-1965 MRN: [id number]   Date of Service  11/01/2018  HPI/Events of Note  Hypertension - SBP reported to be 170-180's. Hover, BP now = 147/81. Request for PRN antihypertensive.   eICU Interventions  Will order: 1. Hydralazine 10 mg IV Q 4 hours PRN SBP > 170 or DBP > 100.      Intervention Category Major Interventions: Hypertension - evaluation and management  Sommer,Steven Eugene 11/01/2018, 8:04 PM

## 2018-11-01 NOTE — Progress Notes (Signed)
[place] Progress Note Patient Name: Sarah [name] DOB: [birth year] MRN: [id number]   Date of Service  11/01/2018  HPI/Events of Note  ABG on 40%/PRVC 18/TV 570/P 8 = 7.512/32.8/64.0. [name] breathing with ventilator at set rate of 18.  eICU Interventions  Will order: 1. Decrease PRVC rate to 14. 2. Repeat ABG at 7:15 AM.     Intervention Category Major Interventions: Acid-Base disturbance - evaluation and management;Respiratory failure - evaluation and management  Sommer,Steven Cornelia Copa 11/01/2018, 5:41 AM

## 2018-11-01 NOTE — Consult Note (Addendum)
[place] Nurse wound consult note Reason for Consult: Consult requested for sacrum and bilat ischium.   Wound type: Sacrum with deep tissue pressure injury; dark purple-red intact skin, 3X3cm Left ischium with chronic stage 4 pressure injury; 3X4X4cm, dark red, moist woundbed, no odor, mod amt tan drainage, bone palpable Right ischium with chronic stage 4 pressure injury; 4X4X4cm, dark red, moist woundbed, no odor, mod amt tan drainage, bone palpable Pressure Injury POA: Yes Dressing procedure/placement/frequency: Pt is critically ill with multiple systemic factors which can impair healing. Foam dressing to protect and promote healing to sacrum wound.  Pt is on a low airloss mattress to reduce pressure.   Moist gauze dressings to bilat ischium and foam dressings to protect from further injury. Please re-consult if further assistance is needed.  Thank-you,  Julien Girt MSN, [place], [place], [place], [place]

## 2018-11-01 NOTE — Progress Notes (Signed)
Palliative: [name], [place], is lying quietly in bed.  She is intubated/ventilated, sedated.  She appears acutely/chronically ill and frail.  She opens her eyes and makes but does not keep eye contact.  She waves her hand at me, and is able to nod yes and no appropriately during our visit.  There is no family at bedside at this time.  At this point, Jaclin's mother/surrogate decision-maker, Sarah [name], has shared with the medical team that their goal would be trach/PEG if needed.  PMT to continue to shadow for needs/decline and support for staff.  Thankfully Sarah [name] is now partial code, no chest compressions or defibrillation.   Plan: Continue to treat the treatable, trach/PEG if needed, no CPR no defibrillation.  25 minutes  Sarah Axe, NP Palliative medicine team Team Phone # [phone] Greater than 50% of this time was spent counseling and coordinating care related to the above assessment and plan.

## 2018-11-01 NOTE — Progress Notes (Signed)
Attempted wean, pt's RR went above 40. Placed pt back on previous mode. RT will attempt at a later time. RN aware.

## 2018-11-02 ENCOUNTER — Inpatient Hospital Stay (HOSPITAL_COMMUNITY): Payer: Medicare Other

## 2018-11-02 DIAGNOSIS — R609 Edema, unspecified: Secondary | ICD-10-CM

## 2018-11-02 LAB — CBC
HCT: 27.1 % — ABNORMAL LOW (ref 36.0–46.0)
Hemoglobin: 8.9 g/dL — ABNORMAL LOW (ref 12.0–15.0)
MCH: 30.3 pg (ref 26.0–34.0)
MCHC: 32.8 g/dL (ref 30.0–36.0)
MCV: 92.2 fL (ref 80.0–100.0)
Platelets: 45 10*3/uL — ABNORMAL LOW (ref 150–400)
RBC: 2.94 MIL/uL — ABNORMAL LOW (ref 3.87–5.11)
RDW: 18.1 % — ABNORMAL HIGH (ref 11.5–15.5)
WBC: 17.8 10*3/uL — ABNORMAL HIGH (ref 4.0–10.5)
nRBC: 0.2 % (ref 0.0–0.2)

## 2018-11-02 LAB — POCT I-STAT 7, (LYTES, BLD GAS, ICA,H+H)
Acid-Base Excess: 10 mmol/L — ABNORMAL HIGH (ref 0.0–2.0)
Bicarbonate: 32.9 mmol/L — ABNORMAL HIGH (ref 20.0–28.0)
Calcium, Ion: 1.07 mmol/L — ABNORMAL LOW (ref 1.15–1.40)
HCT: 26 % — ABNORMAL LOW (ref 36.0–46.0)
Hemoglobin: 8.8 g/dL — ABNORMAL LOW (ref 12.0–15.0)
O2 Saturation: 93 %
Patient temperature: 98.6
Potassium: 3 mmol/L — ABNORMAL LOW (ref 3.5–5.1)
Sodium: 152 mmol/L — ABNORMAL HIGH (ref 135–145)
TCO2: 34 mmol/L — ABNORMAL HIGH (ref 22–32)
pCO2 arterial: 37.3 mmHg (ref 32.0–48.0)
pH, Arterial: 7.553 — ABNORMAL HIGH (ref 7.350–7.450)
pO2, Arterial: 58 mmHg — ABNORMAL LOW (ref 83.0–108.0)

## 2018-11-02 LAB — BASIC METABOLIC PANEL
Anion gap: 12 (ref 5–15)
BUN: 51 mg/dL — ABNORMAL HIGH (ref 6–20)
CO2: 30 mmol/L (ref 22–32)
Calcium: 6.6 mg/dL — ABNORMAL LOW (ref 8.9–10.3)
Chloride: 109 mmol/L (ref 98–111)
Creatinine, Ser: 0.78 mg/dL (ref 0.44–1.00)
GFR calc Af Amer: >60 mL/min (ref >60)
GFR calc non Af Amer: >60 mL/min (ref >60)
Glucose, Bld: 184 mg/dL — ABNORMAL HIGH (ref 70–99)
Potassium: 3.2 mmol/L — ABNORMAL LOW (ref 3.5–5.1)
Sodium: 151 mmol/L — ABNORMAL HIGH (ref 135–145)

## 2018-11-02 LAB — GLUCOSE, CAPILLARY
Glucose-Capillary: 151 mg/dL — ABNORMAL HIGH (ref 70–99)
Glucose-Capillary: 152 mg/dL — ABNORMAL HIGH (ref 70–99)
Glucose-Capillary: 157 mg/dL — ABNORMAL HIGH (ref 70–99)
Glucose-Capillary: 160 mg/dL — ABNORMAL HIGH (ref 70–99)
Glucose-Capillary: 162 mg/dL — ABNORMAL HIGH (ref 70–99)
Glucose-Capillary: 191 mg/dL — ABNORMAL HIGH (ref 70–99)

## 2018-11-02 LAB — TYPE AND SCREEN
ABO/RH(D): O POS
Antibody Screen: NEGATIVE
Unit division: 0

## 2018-11-02 LAB — BPAM RBC
Blood Product Expiration Date: 202009032359
ISSUE DATE / TIME: 202008101511
Unit Type and Rh: 5100

## 2018-11-02 LAB — PHOSPHORUS: Phosphorus: 1.9 mg/dL — ABNORMAL LOW (ref 2.5–4.6)

## 2018-11-02 LAB — MAGNESIUM: Magnesium: 1.8 mg/dL (ref 1.7–2.4)

## 2018-11-02 LAB — HEPARIN INDUCED PLATELET AB (HIT ANTIBODY): Heparin Induced Plt Ab: 0.141 OD (ref 0.000–0.400)

## 2018-11-02 MED ORDER — ORAL CARE MOUTH RINSE
15.0000 mL | OROMUCOSAL | Status: DC
  Administered 2018-11-02 – 2018-11-04 (×22): 15 mL via OROMUCOSAL

## 2018-11-02 MED ORDER — MAGNESIUM SULFATE 2 GM/50ML IV SOLN
2.0000 g | Freq: Once | INTRAVENOUS | Status: AC
  Administered 2018-11-02: 09:00:00 2 g via INTRAVENOUS
  Filled 2018-11-02: qty 50

## 2018-11-02 MED ORDER — POTASSIUM CHLORIDE 20 MEQ/15ML (10%) PO SOLN
30.0000 meq | ORAL | Status: AC
  Administered 2018-11-02 (×2): 30 meq
  Filled 2018-11-02 (×2): qty 30

## 2018-11-02 MED ORDER — CHLORHEXIDINE GLUCONATE 0.12% ORAL RINSE (MEDLINE KIT)
15.0000 mL | Freq: Two times a day (BID) | OROMUCOSAL | Status: DC
  Administered 2018-11-02 – 2018-11-04 (×5): 15 mL via OROMUCOSAL

## 2018-11-02 MED ORDER — POTASSIUM PHOSPHATES 15 MMOLE/5ML IV SOLN
20.0000 mmol | Freq: Once | INTRAVENOUS | Status: AC
  Administered 2018-11-02: 20 mmol via INTRAVENOUS
  Filled 2018-11-02: qty 6.67

## 2018-11-02 NOTE — Progress Notes (Signed)
[facility] ADULT ICU REPLACEMENT PROTOCOL FOR AM LAB REPLACEMENT ONLY  The patient does apply for the [facility] Adult ICU Electrolyte Replacment Protocol based on the criteria listed below:   1. Is GFR >/= 40 ml/min? Yes.    Patient's GFR today is >60 2. Is urine output >/= 0.5 ml/kg/hr for the last 6 hours? Yes.   Patient's UOP is 2.1 ml/kg/hr 3. Is BUN < 60 mg/dL? Yes.    Patient's BUN today is 51 4. Abnormal electrolyte(s) K-3.2 5. Ordered repletion with: per protocol 6. If a panic level lab has been reported, has the CCM MD in charge been notified? Yes.  .   Physician:  Dr. Terrill Mohr, Philis [place] 11/02/2018 5:37 AM

## 2018-11-02 NOTE — Progress Notes (Signed)
Patient has a QTc of 508, down from 600. Plan to repeat EKG tomorrow AM.

## 2018-11-02 NOTE — Progress Notes (Signed)
Bilateral lower extremity venous duplex completed. Preliminary results in Chart review CV Proc. [facility], [place] 11/02/2018, 10:50 AM

## 2018-11-02 NOTE — Progress Notes (Signed)
Attempted wean with pt. Pt desaturated below 82 with a RR above 40. Placed pt back on previous mode. Pt is stable at this time.

## 2018-11-02 NOTE — Progress Notes (Signed)
NAME:  Sarah [name], MRN:  [id number], DOB:  Jul 23, 1965, LOS: 8 ADMISSION DATE:  11/03/2018, CONSULTATION DATE:  10/30/2018 REFERRING MD: Jani Gravel , CHIEF COMPLAINT:  Acute respiratory distress   Brief History   She was initially admitted to [facility] 8/3 from [facility] for increased work of breathing, diagnosed with pneumonia.  Was also found to have a catheter associated UTI, with colonies growing morganella and Ecoli.  She was on vancomycin and zosyn, initially, then transitioned to meropenem 8/6 due to better susceptibilities.  During admission, she was also started on treatment for esophageal candidiasis with fluconazole.  She has difficulty clearing secretions, and found to be high risk for aspiration a modified barium swallow 8/7, and made NPO.    This evening, patient had increasing oxygen requirements and increased work of breathing while on the floor.  She was on 3L up until yesterday, when she was increased to 6L all day today, then further increased to 15L this evening.  Found to have worsening left sided mucus plugging vs consolidation.    On arrival to [facility] ICU, patient is lethargic but oriented and responsive to voice, is having phenylephrine running through peripheral IV at 30, and is satting 99% on 15L O2.    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  Had craniectomy 3/202 History of PE, on xarelto Sacral wounds, s/p debridement of sacral ulcer 08/10/2018, CT negative for osteomyelitis   Significant Hospital Events   11/11/2018 - admit 11/01/2018 - failed SBT  Consults:  Palliative care  Procedures:  10/30/2018 intubation>>    Significant Diagnostic Tests:  ABG 7.208/48/81  Micro Data:  Urine culture 8/03: Morganella and E coli 10/30/2018 BAL showing rare yeast>> 10/30/2018 blood cultures x2>>  Antimicrobials:  Vanc/zosyn 8/3-8/5 Meropenem 8/6- Fluconazole 8/7- Vancomycin 10/30/2018 discontinued  on 10/31/2018    SUBJECTIVE/OVERNIGHT/INTERVAL HX   11/02/2018 - plat 45K and pharmacy wants to hold lovenox. HITT panel pending. Low mag, k and phos., Desaturated on 40% fio2 on vent. Needing 60% . On precedex. Tachypneic even on full support   Objective   Blood pressure 98/60, pulse 99, temperature 98.1 F (36.7 C), resp. rate (!) 26, height _0  (1.803 m), weight 89.7 kg, last menstrual period 10/22/2016, SpO2 (!) 84 %.    Vent Mode: PRVC FiO2 (%):  [40 %-60 %] 60 % Set Rate:  [14 bmp] 14 bmp Vt Set:  [570 mL] 570 mL PEEP:  [5 cmH20] 5 cmH20 Pressure Support:  [10 cmH20] 10 cmH20 Plateau Pressure:  [15 cmH20-32 cmH20] 15 cmH20   Intake/Output Summary (Last 24 hours) at 11/02/2018 0835 Last data filed at 11/02/2018 0600 Gross per 24 hour  Intake 2820.78 ml  Output 6325 ml  Net -3504.22 ml   Filed Weights   10/31/18 0410 11/01/18 0423 11/02/18 0500  Weight: 89.7 kg 89.6 kg 89.7 kg     General Appearance:  Looks criticall ill Head:  Normocephalic, without obvious abnormality, atraumatic Eyes:  PERRL - yes, conjunctiva/corneas - muddy     Ears:  Normal external ear canals, both ears Nose:  G tube - no Throat:  ETT TUBE - yes , OG tube - yes Neck:  Supple,  No enlargement/tenderness/nodules Lungs: Clear to auscultation bilaterally, Ventilator   Synchrony - yes but tachypneic. 60% on PRVC Heart:  S1 and S2 normal, no murmur, CVP - no.  Pressors - no Abdomen:  Soft, no masses, no organomegaly Genitalia /  Rectal:  Not done Extremities:  Extremities- paraplegic Skin:  ntact in exposed areas . Sacral area - not examined Neurologic:  Sedation - precedex gtt -> RASS - 0 . Moves all 4s - no. CAM-ICU - not able to assess . Orientation - follows      Resolved Hospital Problem list     Assessment & Plan:  53 yo F with a history of paraplegia due to metastatic Hurthle cell cancer, sacral wounds, chronic hypotension, chronic indwelling foley catheter, who transferred from [facility] for worsening hypoxic respiratory failure due top left sided mucus plugging vs LLL consoildation, and worsening hypotension.    Acute hypoxic respiratory failure, now with respiratory alkalosis: Mucus plugging and aspiration pneumonitis are both at play, weak cough. =Intubated 10/30/2018 s.p BRonch and BAL 10/30/2018   11/02/2018 - > does not meet criteria for SBT/Extubation in setting of Acute Respiratory Failure due to Tachypnea and fio2 60%  PLAN Continue PSV Tracheostomy or PEG tube per mother who is POA   Shock - Mild hypotension, in the setting of chronic hypotension (thought to be due to autonomic dysfunction from paraplegia).    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   PLAN Currently on meropenem Follow culture data    Esophageal candidiasis - Fluconazole started on 10/29/2018, noted to have yeast on BAL  PLAN  - dc diflucan (s/p 4 day Rx ending 11/01/2018); has long QTc - investigate rationale for esophageal candidiasis diagnosis by chart review  Chronic issues: Myelopathy and paraplegia: metastatic spinal involvement, s/p resection last in 08/2017 Hypothyroidism: TSH ~6 in June  PLAN Continue IV Synthroid IV change over to p.o. she has a feeding tube in place  History of PE: On xarelto at home.  Moderate thrombocytopneia - new since 11/01/2018 Anemia critical illness  11/02/2018 - moderate to severe thrombocytopenia + without bleed  Plan  - dc Rx dose lovenox  - await HITT panel 11/01/2018  - get duplex LE - - PRBC for hgb </= 6.9gm%    - exceptions are   -  if ACS susepcted/confirmed then transfuse  for hgb </= 8.0gm%,  or    -  active bleeding with hemodynamic instability, then transfuse regardless of hemoglobin value   At at all times try to transfuse 1 unit prbc as possible with exception of active hemorrhage       Electrolyte Imbalance  Plan  - replete K, phos, mag  Hyperglycemia   Plan  sso   Hurthle Cell carcinoma: follows with [name]  GOC: Spoke with the mother who is power of attorney she is no limited CODE BLUE with no CPR or cardioversion trach or PEG short-term intubation only.   Best practice:  Diet: Tube feeding Pain/Anxiety/Delirium protocol (if indicated): Sedation protocol for tube collar VAP protocol (if indicated): Yes DVT prophylaxis: lovenox, therapeutic GI prophylaxis: PPI Glucose control: SSI Mobility: Currently on bedrest Code Status: Full code Family Communication: 8/11 - LMTCB wit mom Tyler Deis on her cell Disposition: ICU    ATTESTATION & SIGNATURE   The patient Sarah [name] is critically ill with multiple organ  systems failure and requires high complexity decision making for assessment and support, frequent evaluation and titration of therapies, application of advanced monitoring technologies and extensive interpretation of multiple databases.   Critical Care Time devoted to patient care services described in this note is  30  Minutes. This time reflects time of care of this signee Dr Brand Males. This critical care time does not reflect procedure time, or teaching time or supervisory time of PA/NP/Med student/Med Resident etc but could involve care discussion time     Dr. Brand Males, M.D., [facility].C.P Pulmonary and Critical Care Medicine Staff Physician [facility] Pager: [phone], If no answer or between  15:00h - 7:00h: call [phone]  11/02/2018 8:36 AM    Labs  For reference    LABS    PULMONARY Recent Labs  Lab 10/31/18 0502 10/31/18 1044  11/01/18 0336 11/01/18 0728 11/02/18 0359  PHART 7.581* 7.487* 7.512* 7.494* 7.553*  PCO2ART 24.1* 33.6 32.8 34.7 37.3  PO2ART 79.0* 95.0 64.0* 56.0* 58.0*  HCO3 22.6 25.4 26.4 26.7 32.9*  TCO2 _0 34*  O2SAT 98.0 98.0 95.0 91.0 93.0    CBC Recent Labs  Lab 10/31/18 0455  11/01/18 0325  11/01/18 1931 11/02/18 0238 11/02/18 0359  HGB 8.1*   < > 7.0*   < > 9.1* 8.9* 8.8*  HCT 24.8*   < > 21.5*   < > 27.5* 27.1* 26.0*  WBC 11.3*  --  11.7*  --   --  17.8*  --   PLT 133*  --  67*  --   --  45*  --    < > = values in this interval not displayed.    COAGULATION Recent Labs  Lab 10/30/18 0143  INR 2.5*    CARDIAC  No results for input(s): TROPONINI in the last 168 hours. No results for input(s): PROBNP in the last 168 hours.   CHEMISTRY Recent Labs  Lab 10/30/18 1405 10/30/18 2034 10/31/18 0253  11/01/18 0325 11/01/18 0336 11/01/18 0728 11/02/18 0238 11/02/18 0359  NA 147* 147* 146*   < > 148* 150* 151* 151* 152*  K 2.4* 3.3* 4.0   < > 3.7 3.5 3.3* 3.2* 3.0*  CL 113* 111 105  --  112*  --   --  109  --   CO2 16* 23 27  --  26  --   --  30  --   GLUCOSE 137* 254* 336*  --  249*  --   --  184*  --   BUN 31* 31* 32*  --  40*  --   --  51*  --   CREATININE 1.50* 1.31* 1.29*  --  0.85  --   --  0.78  --   CALCIUM 7.4* 7.0* 6.5*  --  7.3*  --   --  6.6*  --   MG  --   --  2.1  --  2.2  --   --  1.8  --   PHOS  --   --  <1.0*  --  1.0*  --   --  1.9*  --    < > = values in this interval not displayed.   Estimated Creatinine Clearance: 101.8 mL/min (by C-G formula based on SCr of 0.78 mg/dL).   LIVER Recent Labs  Lab 10/27/18 0546 10/29/18 2141 10/30/18 0143  AST 54* 25 29  ALT 22 20 22  ALKPHOS 122 101 114  BILITOT 1.2 1.3* 1.7*  PROT 5.2* 5.5* 5.3*  ALBUMIN 1.9* 1.8* 1.6*  INR  --   --  2.5*     INFECTIOUS Recent Labs  Lab 10/27/18 0546 10/28/18 0440 10/29/18 2142 10/30/18 0142 10/30/18 0557  LATICACIDVEN  --   --  1.0 1.2 1.5   PROCALCITON 6.26 4.70  --   --   --      ENDOCRINE CBG (last 3)  Recent Labs    11/01/18 1922 11/02/18 0027 11/02/18 0319  GLUCAP 119* 191* 160*         IMAGING x48h  - image(s) personally visualized  -   highlighted in bold Dg Abd 1 View  Result Date: 10/31/2018 CLINICAL DATA:  Evaluate OG tube placement EXAM: ABDOMEN - 1 VIEW COMPARISON:  None. FINDINGS: The OG tube terminates near the junction of the distal stomach and proximal duodenum. I suspect the tip is likely in the duodenal bulb. IMPRESSION: The OG tube terminates near the junction of the distal stomach and proximal duodenum. I suspect the tip is in the duodenal bulb. Electronically Signed   By: Dorise Bullion III M.D   On: 10/31/2018 12:40   Dg Chest Port 1 View  Result Date: 11/02/2018 CLINICAL DATA:  Endotracheal tube placement. EXAM: PORTABLE CHEST 1 VIEW COMPARISON:  Radiographs of November 01, 2018. FINDINGS: The heart size and mediastinal contours are within normal limits. Endotracheal and nasogastric tubes are unchanged in position. Stable position of right-sided PICC line. Stable bilateral lung opacities are noted, left greater than right, consistent with multifocal pneumonia. No pneumothorax or pleural effusion is noted. The visualized skeletal structures are unremarkable. IMPRESSION: Stable support apparatus. Stable bilateral lung opacities as described above. Electronically Signed   By: Marijo Conception M.D.   On: 11/02/2018 07:53   Dg Chest Port 1 View  Result Date: 11/01/2018 CLINICAL DATA:  Respiratory failure. EXAM: PORTABLE CHEST 1 VIEW COMPARISON:  Radiograph of October 31, 2018. FINDINGS: The heart size and mediastinal contours are within normal limits. No pneumothorax or pleural effusion is noted. Endotracheal and nasogastric tubes are unchanged in position. Right-sided PICC line is unchanged in position. Stable bilateral lung opacities are noted consistent with multifocal pneumonia. The visualized skeletal  structures are unremarkable. IMPRESSION: Stable support apparatus. Stable bilateral lung opacities are noted consistent with multifocal pneumonia. Electronically Signed   By: Marijo Conception M.D.   On: 11/01/2018 07:43

## 2018-11-02 NOTE — Progress Notes (Signed)
Palliative: Sarah [name] is being attended to by nursing staff at bedside.  She failed weaning trials this morning, and respiratory therapy increased her FiO2 from 40% to 70% at that time.  There is no family at bedside at this time.  Sarah [name] remains critically ill.  Conference with bedside nursing staff related to patient condition, goals.  Call to mother, healthcare surrogate, Sarah [name].  Sarah [name] (Sarah [name]) tells me that Sarah [name] is very upset, and unable to come to the phone.  I ask if she has spoken with any of the providers from the hospital yet this morning.  Sarah [name] says that they have not spoken with anyone except Sarah [name].  I asked Sarah [name] to ask Sarah [name] if she has questions for me, he states that she does not at this point.  Sarah [name] does share that Sarah [name]  "wouldn't want to prolong it", but he is unable to give further information.  Sarah [name] tells me that he is coming to the hospital soon, I ask if Sarah [name] would like to be here.  He shares that she is so upset that she cannot participate, sharing that she told him that he would have to "handle it".  Sarah [name] at [phone]. We discussed failed weaning attempt, and Sarah [name]'s current condition.  Sarah [name] agrees that they would not want Sarah [name] to have a trach or PEG.  We talked about how long should we keep Sarah [name] on the breathing machine.  Sarah [name] states that she and her [name] are coming now to see Sarah [name], but she does not believe that their mother can see Sarah [name] in this condition.  Psychosocial support given.  Bedside nursing staff and attending updated.  Plan: At this point continue to treat the treatable but no PEG/no trach.  Compassionate extubation discussed with family.   60 minutes, extended time Sarah Axe, NP Palliative medicine team Team Phone # [phone] Greater than 50% of this time was spent counseling and coordinating care related to the above assessment and plan.

## 2018-11-03 ENCOUNTER — Encounter: Payer: Medicare Other | Admitting: Physical Medicine & Rehabilitation

## 2018-11-03 ENCOUNTER — Inpatient Hospital Stay (HOSPITAL_COMMUNITY): Payer: Medicare Other

## 2018-11-03 LAB — GLUCOSE, CAPILLARY
Glucose-Capillary: 135 mg/dL — ABNORMAL HIGH (ref 70–99)
Glucose-Capillary: 143 mg/dL — ABNORMAL HIGH (ref 70–99)
Glucose-Capillary: 149 mg/dL — ABNORMAL HIGH (ref 70–99)
Glucose-Capillary: 153 mg/dL — ABNORMAL HIGH (ref 70–99)
Glucose-Capillary: 153 mg/dL — ABNORMAL HIGH (ref 70–99)
Glucose-Capillary: 165 mg/dL — ABNORMAL HIGH (ref 70–99)
Glucose-Capillary: 179 mg/dL — ABNORMAL HIGH (ref 70–99)

## 2018-11-03 LAB — BASIC METABOLIC PANEL
Anion gap: 9 (ref 5–15)
Anion gap: 6 (ref 5–15)
BUN: 55 mg/dL — ABNORMAL HIGH (ref 6–20)
BUN: 52 mg/dL — ABNORMAL HIGH (ref 6–20)
CO2: 29 mmol/L (ref 22–32)
CO2: 30 mmol/L (ref 22–32)
Calcium: 7 mg/dL — ABNORMAL LOW (ref 8.9–10.3)
Calcium: 7 mg/dL — ABNORMAL LOW (ref 8.9–10.3)
Chloride: 111 mmol/L (ref 98–111)
Chloride: 112 mmol/L — ABNORMAL HIGH (ref 98–111)
Creatinine, Ser: 0.74 mg/dL (ref 0.44–1.00)
Creatinine, Ser: 0.72 mg/dL (ref 0.44–1.00)
GFR calc Af Amer: >60 mL/min (ref >60)
GFR calc Af Amer: >60 mL/min (ref >60)
GFR calc non Af Amer: >60 mL/min (ref >60)
GFR calc non Af Amer: >60 mL/min (ref >60)
Glucose, Bld: 183 mg/dL — ABNORMAL HIGH (ref 70–99)
Glucose, Bld: 161 mg/dL — ABNORMAL HIGH (ref 70–99)
Potassium: 3.7 mmol/L (ref 3.5–5.1)
Potassium: 3.4 mmol/L — ABNORMAL LOW (ref 3.5–5.1)
Sodium: 149 mmol/L — ABNORMAL HIGH (ref 135–145)
Sodium: 148 mmol/L — ABNORMAL HIGH (ref 135–145)

## 2018-11-03 LAB — CBC WITH DIFFERENTIAL/PLATELET
Abs Immature Granulocytes: 0.36 10*3/uL — ABNORMAL HIGH (ref 0.00–0.07)
Basophils Absolute: 0 10*3/uL (ref 0.0–0.1)
Basophils Relative: 0 %
Eosinophils Absolute: 0.2 10*3/uL (ref 0.0–0.5)
Eosinophils Relative: 1 %
HCT: 25 % — ABNORMAL LOW (ref 36.0–46.0)
Hemoglobin: 8.2 g/dL — ABNORMAL LOW (ref 12.0–15.0)
Immature Granulocytes: 2 %
Lymphocytes Relative: 3 %
Lymphs Abs: 0.5 10*3/uL — ABNORMAL LOW (ref 0.7–4.0)
MCH: 30.6 pg (ref 26.0–34.0)
MCHC: 32.8 g/dL (ref 30.0–36.0)
MCV: 93.3 fL (ref 80.0–100.0)
Monocytes Absolute: 0.1 10*3/uL (ref 0.1–1.0)
Monocytes Relative: 1 %
Neutro Abs: 14.9 10*3/uL — ABNORMAL HIGH (ref 1.7–7.7)
Neutrophils Relative %: 93 %
Platelets: 35 10*3/uL — ABNORMAL LOW (ref 150–400)
RBC: 2.68 MIL/uL — ABNORMAL LOW (ref 3.87–5.11)
RDW: 18.6 % — ABNORMAL HIGH (ref 11.5–15.5)
WBC: 16.1 10*3/uL — ABNORMAL HIGH (ref 4.0–10.5)
nRBC: 0.1 % (ref 0.0–0.2)

## 2018-11-03 LAB — CULTURE, BAL-QUANTITATIVE W GRAM STAIN: Culture: 40000 — AB

## 2018-11-03 LAB — MAGNESIUM: Magnesium: 2.1 mg/dL (ref 1.7–2.4)

## 2018-11-03 LAB — CULTURE, BLOOD (ROUTINE X 2)
Culture: NO GROWTH
Culture: NO GROWTH
Special Requests: ADEQUATE

## 2018-11-03 LAB — PHOSPHORUS: Phosphorus: 1.9 mg/dL — ABNORMAL LOW (ref 2.5–4.6)

## 2018-11-03 MED ORDER — POTASSIUM CHLORIDE 20 MEQ/15ML (10%) PO SOLN
20.0000 meq | ORAL | Status: AC
  Administered 2018-11-03 (×2): 20 meq
  Filled 2018-11-03 (×2): qty 15

## 2018-11-03 MED ORDER — CLONIDINE HCL 0.3 MG PO TABS
0.3000 mg | ORAL_TABLET | Freq: Four times a day (QID) | ORAL | Status: DC
  Administered 2018-11-03 – 2018-11-04 (×4): 0.3 mg
  Filled 2018-11-03 (×4): qty 1

## 2018-11-03 NOTE — Progress Notes (Signed)
CVP line flushed and placed in room for RN to change line when ready.

## 2018-11-03 NOTE — Progress Notes (Signed)
Palliative Medicine RN Note: Rec'd another call from pt's sister Magdalene River regarding visitation. She reports that yesterday family was allowed to visit but nursing staff is telling her today that only one person can come and that they can't change who that is.  I explained that the nurses are following institutional policy and that an exception was made yesterday because there was a meeting with the provider. Because Meiya is not imminently dying and there is no meeting scheduled for a meeting, the nurses are following policy as is required.  [name] verbalized understanding.  [name] Aronda Burford, RN, BSN, [facility] Palliative Medicine Team 11/03/2018 10:06 AM Office [phone]

## 2018-11-03 NOTE — Progress Notes (Signed)
[facility] ADULT ICU REPLACEMENT PROTOCOL FOR AM LAB REPLACEMENT ONLY  The patient does apply for the [facility] Adult ICU Electrolyte Replacment Protocol based on the criteria listed below:   1. Is GFR >/= 40 ml/min? Yes.    Patient's GFR today is >60 2. Is urine output >/= 0.5 ml/kg/hr for the last 6 hours? Yes.   Patient's UOP is 1.1 ml/kg/hr 3. Is BUN < 60 mg/dL? Yes.    Patient's BUN today is 52 4. Abnormal electrolyte(s): K-3.4 5. Ordered repletion with: per protocol 6. If a panic level lab has been reported, has the CCM MD in charge been notified? Yes.  .   Physician:  Dr. Terrill Mohr, Philis Nettle 11/03/2018 5:53 AM

## 2018-11-03 NOTE — Progress Notes (Signed)
Palliative Medicine RN Note:  Rec'd call from family, who states that they do not want to "take her off" today & don't expect to come in. Our team is scheduled for follow up tomorrow, so I passed this message on to the primary RN.   [name] Aundra Pung, RN, BSN, [facility] Palliative Medicine Team 11/03/2018 8:45 AM Office [phone]

## 2018-11-03 NOTE — Progress Notes (Signed)
SLP D/C Note  Patient Details Name: LUMA CLOPPER MRN: [id number] DOB: [birth year]          Pt remains intubated; Palliative medicine is following, family is considering one-way extubation.  Our service will respectfully sign off at this time.   Juan Quam Laurice 11/03/2018, 11:13 AM

## 2018-11-03 NOTE — Progress Notes (Signed)
Pharmacy Antibiotic Note  Sarah [name] is a 53 y.o. female admitted on 10/26/2018 with HCAP/ESBL e. coli UTI/MDR moganella.  Pharmacy has been consulted for Merrem dosing. Day # 7 of therapy. WBC 16.1, trending down. Tmax 100.2. SCr improved at 0.72 with CrCl >100.   Plan: Meropenem 1000 mg IV every 8 hours.  Monitor labs, c/s, and patient improvement.  Height: 5\' 11"  (180.3 cm) Weight: 196 lb 3.4 oz (89 kg) IBW/kg (Calculated) : 70.8  Temp (24hrs), Avg:100 F (37.8 C), Min:98.4 F (36.9 C), Max:101.1 F (38.4 C)  Recent Labs  Lab 10/29/18 2142 10/30/18 0142  10/30/18 0557  10/31/18 0253 10/31/18 0455 11/01/18 0325 11/02/18 0238 11/03/18 0040 11/03/18 0430  WBC  --   --    < >  --   --  13.2* 11.3* 11.7* 17.8*  --  16.1*  CREATININE  --   --    < >  --    < > 1.29*  --  0.85 0.78 0.74 0.72  LATICACIDVEN 1.0 1.2  --  1.5  --   --   --   --   --   --   --    < > = values in this interval not displayed.    Estimated Creatinine Clearance: 101.4 mL/min (by C-G formula based on SCr of 0.72 mg/dL).    Allergies  Allergen Reactions  . Bee Venom Anaphylaxis  . Heparin Other (See Comments)    Weak positive platelets induced antibodies, SRA negative--2015  . Keflex [Cephalexin] Nausea And Vomiting  . Tramadol Nausea And Vomiting  . Gabapentin Nausea And Vomiting  . Hydrochlorothiazide Nausea And Vomiting  . Hydrocodone Nausea And Vomiting  . Oxycodone-Acetaminophen Nausea And Vomiting  . Sorbitol Nausea Only    And abdominal distress  . Tizanidine     Fatigue     Antimicrobials this admission: Merrem 8/6 >>  Zosyn 8/3 >> 8/5 Vanco 8/3 >>8/5  Dose adjustments this admission: N/A  Microbiology results: 8/3 BCx: ngtd 8/3 UCx: >100K ESBL e.coli, MDR morganella  8/3 MRSA PCR: negative  Thank you for allowing pharmacy to be a part of this patient's care.  Brain Hilts 11/03/2018 2:51 PM

## 2018-11-03 NOTE — Progress Notes (Signed)
NAME:  Sarah [name], MRN:  [id number], DOB:  06/15/1965, LOS: 9 ADMISSION DATE:  11/16/2018, CONSULTATION DATE:  10/30/2018 REFERRING MD: Jani Gravel , CHIEF COMPLAINT:  Acute respiratory distress   Brief History   She was initially admitted to [facility] 8/3 from [facility] for increased work of breathing, diagnosed with pneumonia.  Was also found to have a catheter associated UTI, with colonies growing morganella and Ecoli.  She was on vancomycin and zosyn, initially, then transitioned to meropenem 8/6 due to better susceptibilities.  During admission, she was also started on treatment for esophageal candidiasis with fluconazole.  She has difficulty clearing secretions, and found to be high risk for aspiration a modified barium swallow 8/7, and made NPO.    This evening, patient had increasing oxygen requirements and increased work of breathing while on the floor.  She was on 3L up until yesterday, when she was increased to 6L all day today, then further increased to 15L this evening.  Found to have worsening left sided mucus plugging vs consolidation.  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  Had craniectomy 3/202 History of PE, on xarelto Sacral wounds, s/p debridement of sacral ulcer 08/10/2018, CT negative for osteomyelitis   Significant Hospital Events   10/24/2018 - admit 11/01/2018 - failed SBT 8/12 Off pressors  Consults:  Palliative care  Procedures:  10/30/2018 intubation>>    Significant Diagnostic Tests:  8/11 LE duplex>>neg for DVT  Micro Data:  8/03 Sars-CoV-2>>negative 8/03 Urine culture>> Morganella and E coli>> Morganella sensitive to ceftriaxone, imipenem, zosyn, E. Coli sensitive only to Imipenam 10/30/2018 BAL showing rare yeast>> 10/30/2018 blood cultures x2>>negative  Antimicrobials:  Zosyn >>8/3-8/5 Meropenem>> 8/6- Fluconazole>> 8/7-8/10  Vancomycin>>8/3-8/8   SUBJECTIVE/OVERNIGHT/INTERVAL HX  Pt is now off pressors, through still requiring Precedex and full Vent support   Objective   Blood pressure 133/77, pulse 86, temperature 99.7 F (37.6 C), resp. rate (!) 29, height _0  (1.803 m), weight 89 kg, last menstrual period 10/22/2016, SpO2 93 %.    Vent Mode: PRVC FiO2 (%):  [50 %-60 %] 50 % Set Rate:  [14 bmp] 14 bmp Vt Set:  [570 mL] 570 mL PEEP:  [5 cmH20] 5 cmH20 Plateau Pressure:  [28 cmH20-30 cmH20] 30 cmH20   Intake/Output Summary (Last 24 hours) at 11/03/2018 0856 Last data filed at 11/03/2018 0600 Gross per 24 hour  Intake 4418.35 ml  Output 1275 ml  Net 3143.35 ml   Filed Weights   11/01/18 0423 11/02/18 0500 11/03/18 0500  Weight: 89.6 kg 89.7 kg 89 kg     General:  Elderly F, sedated on ventilator HEENT: MM pink/moist Neuro: opens eyes to sternal rub, not otherwise responsive CV: s1s2 regular rate and rhythm, no m/r/g PULM:  Course breath sounds throughout GI: soft, bsx4 active  Extremities: warm/dry, 1+ peripheral edema  Skin: no rashes or lesions   Resolved Hospital Problem list     Assessment & Plan:  53 yo F with a history of paraplegia due to metastatic Hurthle cell cancer, sacral wounds, chronic hypotension, chronic indwelling foley catheter, who transferred from [facility] for worsening hypoxic respiratory failure due top left sided mucus plugging vs LLL consoildation, and worsening hypotension.      Septic Shock secondary to E. Coli and Morganella UTI -initially requiring pressors, now off neosynephrine -Both E. Coli and Morganella sensitive to Meropenem  -Fever curve improving  and leukocytosis slightly improved to 16K -Completed stress dose steroids P: -Continue Meropenem, blood cultures are negative  -BP stable, monitor off steroids and pressors     Acute Hypoxic Respiratory Failure -secondary to mucous plugging and aspiration pneumonitis -s/p Bronch and BAL 8/8 P:   -Still requiring Precedex -Continue Vent support, SBT and SAT today      History of Paraplegia and Myelopathy secondary to Hurthle Cell Cancer -Follows with [name] outpatient      Esophageal Candidiasis  -completed 4 days Diflucan and then d/c'd secondary to prolonged Qtc    Long Qtc -Continue to monitor and avoid prolonging medications    History of PE -On Xarelto at home, held due to anemia and thrombocytopenis P: -SCD's for DVT prophylaxis, resume Xarelto as able -LE duplex negative for DVT      Thrombocytopenia and Anemia -Hgb stable -Platelets 35 today, no signs of bleeding -Heparin induced Plt Ab 0.141 P: -Follow CBC  -Hold Lovenox     Per family discussion yesterday:  Tracheostomy or PEG tube per mother who is POA limited CODE BLUE with no CPR or cardioversion trach or PEG short-term intubation only.   Best practice:  Diet: Tube feeding Pain/Anxiety/Delirium protocol (if indicated): Sedation protocol for tube collar VAP protocol (if indicated): Yes DVT prophylaxis: lovenox, therapeutic GI prophylaxis: PPI Glucose control: SSI Mobility: Currently on bedrest Code Status: Full code Family Communication: 8/12 Mother updated Disposition: ICU     Labs  For reference    LABS    PULMONARY Recent Labs  Lab 10/31/18 0502 10/31/18 1044 11/01/18 0336 11/01/18 0728 11/02/18 0359  PHART 7.581* 7.487* 7.512* 7.494* 7.553*  PCO2ART 24.1* 33.6 32.8 34.7 37.3  PO2ART 79.0* 95.0 64.0* 56.0* 58.0*  HCO3 22.6 25.4 26.4 26.7 32.9*  TCO2 _0 34*  O2SAT 98.0 98.0 95.0 91.0 93.0    CBC Recent Labs  Lab 11/01/18 0325  11/02/18 0238 11/02/18 0359 11/03/18 0430  HGB 7.0*   < > 8.9* 8.8* 8.2*  HCT 21.5*   < > 27.1* 26.0* 25.0*  WBC 11.7*  --  17.8*  --  16.1*  PLT 67*  --  45*  --  35*   < > = values in this interval not displayed.    COAGULATION Recent Labs  Lab 10/30/18 0143  INR 2.5*    CARDIAC  No results for  input(s): TROPONINI in the last 168 hours. No results for input(s): PROBNP in the last 168 hours.   CHEMISTRY Recent Labs  Lab 10/31/18 0253  11/01/18 0325  11/01/18 0728 11/02/18 0238 11/02/18 0359 11/03/18 0040 11/03/18 0430  NA 146*   < > 148*   < > 151* 151* 152* 149* 148*  K 4.0   < > 3.7   < > 3.3* 3.2* 3.0* 3.7 3.4*  CL 105  --  112*  --   --  109  --  111 112*  CO2 27  --  26  --   --  30  --  29 30  GLUCOSE 336*  --  249*  --   --  184*  --  183* 161*  BUN 32*  --  40*  --   --  51*  --  55* 52*  CREATININE 1.29*  --  0.85  --   --  0.78  --  0.74 0.72  CALCIUM 6.5*  --  7.3*  --   --  6.6*  --  7.0* 7.0*  MG  2.1  --  2.2  --   --  1.8  --   --  2.1  PHOS <1.0*  --  1.0*  --   --  1.9*  --   --  1.9*   < > = values in this interval not displayed.   Estimated Creatinine Clearance: 101.4 mL/min (by C-G formula based on SCr of 0.72 mg/dL).   LIVER Recent Labs  Lab 10/29/18 2141 10/30/18 0143  AST 25 29  ALT 20 22  ALKPHOS 101 114  BILITOT 1.3* 1.7*  PROT 5.5* 5.3*  ALBUMIN 1.8* 1.6*  INR  --  2.5*     INFECTIOUS Recent Labs  Lab 10/28/18 0440 10/29/18 2142 10/30/18 0142 10/30/18 0557  LATICACIDVEN  --  1.0 1.2 1.5  PROCALCITON 4.70  --   --   --      ENDOCRINE CBG (last 3)  Recent Labs    11/02/18 2358 11/03/18 0424 11/03/18 0748  GLUCAP 153* 149* 135*         IMAGING x48h  - image(s) personally visualized  -   highlighted in bold Dg Chest Port 1 View  Result Date: 11/03/2018 CLINICAL DATA:  Hypoxia EXAM: PORTABLE CHEST 1 VIEW COMPARISON:  November 02, 2018 FINDINGS: Endotracheal tube tip is 3.0 cm above the carina. Nasogastric tube tip and side port are below the diaphragm. Central catheter tip is in the right innominate vein, essentially stable. No pneumothorax. There is patchy airspace opacity bilaterally, more severe on the left than on the right, likely multifocal pneumonia. There may be a degree of superimposed alveolar edema. There  is a small left pleural effusion. Heart is upper normal in size with pulmonary vascularity normal. No adenopathy. Postoperative change noted in the upper thoracic region. IMPRESSION: Tube and catheter positions as described without pneumothorax. Patchy airspace opacity bilaterally, more on the left than on the right, likely multifocal pneumonia. Appearance similar to 1 day prior. Stable cardiac silhouette. Electronically Signed   By: Lowella Grip III M.D.   On: 11/03/2018 08:05   Dg Chest Port 1 View  Result Date: 11/02/2018 CLINICAL DATA:  Endotracheal tube placement. EXAM: PORTABLE CHEST 1 VIEW COMPARISON:  Radiographs of November 01, 2018. FINDINGS: The heart size and mediastinal contours are within normal limits. Endotracheal and nasogastric tubes are unchanged in position. Stable position of right-sided PICC line. Stable bilateral lung opacities are noted, left greater than right, consistent with multifocal pneumonia. No pneumothorax or pleural effusion is noted. The visualized skeletal structures are unremarkable. IMPRESSION: Stable support apparatus. Stable bilateral lung opacities as described above. Electronically Signed   By: Marijo Conception M.D.   On: 11/02/2018 07:53   Vas [place] Lower Extremity Venous (dvt)  Result Date: 11/02/2018  Lower Venous Study Indications: Edema.  Comparison Study: 05/06/2017 left negative. 05/13/2017 right negative Performing Technologist: Toma Copier RVS  Examination Guidelines: A complete evaluation includes B-mode imaging, spectral Doppler, color Doppler, and power Doppler as needed of all accessible portions of each vessel. Bilateral testing is considered an integral part of a complete examination. Limited examinations for reoccurring indications may be performed as noted.  +---------+---------------+---------+-----------+----------+-------+ RIGHT    CompressibilityPhasicitySpontaneityPropertiesSummary  +---------+---------------+---------+-----------+----------+-------+ CFV      Full           Yes      Yes                          +---------+---------------+---------+-----------+----------+-------+ SFJ  Full                                                 +---------+---------------+---------+-----------+----------+-------+ FV Prox  Full           Yes      Yes                          +---------+---------------+---------+-----------+----------+-------+ FV Mid   Full                                                 +---------+---------------+---------+-----------+----------+-------+ FV DistalFull           Yes      Yes                          +---------+---------------+---------+-----------+----------+-------+ PFV      Full           Yes      Yes                          +---------+---------------+---------+-----------+----------+-------+ POP      Full           Yes      Yes                          +---------+---------------+---------+-----------+----------+-------+ PTV      Full                                                 +---------+---------------+---------+-----------+----------+-------+ PERO     Full                                                 +---------+---------------+---------+-----------+----------+-------+   +---------+---------------+---------+-----------+----------+-------+ LEFT     CompressibilityPhasicitySpontaneityPropertiesSummary +---------+---------------+---------+-----------+----------+-------+ CFV      Full           Yes      Yes                          +---------+---------------+---------+-----------+----------+-------+ SFJ      Full                                                 +---------+---------------+---------+-----------+----------+-------+ FV Prox  Full           Yes      Yes                          +---------+---------------+---------+-----------+----------+-------+ FV Mid   Full                                                  +---------+---------------+---------+-----------+----------+-------+  FV DistalFull           Yes      Yes                          +---------+---------------+---------+-----------+----------+-------+ PFV      Full           Yes      Yes                          +---------+---------------+---------+-----------+----------+-------+ POP      Full           Yes      Yes                          +---------+---------------+---------+-----------+----------+-------+ PTV      Full                                                 +---------+---------------+---------+-----------+----------+-------+ PERO     Full                                                 +---------+---------------+---------+-----------+----------+-------+     Summary: Right: There is no evidence of deep vein thrombosis in the lower extremity. No cystic structure found in the popliteal fossa. Left: There is no evidence of deep vein thrombosis in the lower extremity. No cystic structure found in the popliteal fossa.  *See table(s) above for measurements and observations. Electronically signed by Harold Barban MD on 11/02/2018 at 8:55:03 PM.    Final    Critical Care time 34 minutes   Otilio Carpen Tomiko Schoon, PA-C [place] [facility] Pager# [phone]

## 2018-11-04 DIAGNOSIS — Z515 Encounter for palliative care: Secondary | ICD-10-CM

## 2018-11-04 DIAGNOSIS — J9601 Acute respiratory failure with hypoxia: Secondary | ICD-10-CM

## 2018-11-04 LAB — CBC WITH DIFFERENTIAL/PLATELET
Abs Immature Granulocytes: 0.4 10*3/uL — ABNORMAL HIGH (ref 0.00–0.07)
Basophils Absolute: 0 10*3/uL (ref 0.0–0.1)
Basophils Relative: 0 %
Eosinophils Absolute: 0.2 10*3/uL (ref 0.0–0.5)
Eosinophils Relative: 2 %
HCT: 24.8 % — ABNORMAL LOW (ref 36.0–46.0)
Hemoglobin: 7.7 g/dL — ABNORMAL LOW (ref 12.0–15.0)
Immature Granulocytes: 3 %
Lymphocytes Relative: 4 %
Lymphs Abs: 0.6 10*3/uL — ABNORMAL LOW (ref 0.7–4.0)
MCH: 30.2 pg (ref 26.0–34.0)
MCHC: 31 g/dL (ref 30.0–36.0)
MCV: 97.3 fL (ref 80.0–100.0)
Monocytes Absolute: 0.1 10*3/uL (ref 0.1–1.0)
Monocytes Relative: 1 %
Neutro Abs: 13.1 10*3/uL — ABNORMAL HIGH (ref 1.7–7.7)
Neutrophils Relative %: 90 %
Platelets: 32 10*3/uL — ABNORMAL LOW (ref 150–400)
RBC: 2.55 MIL/uL — ABNORMAL LOW (ref 3.87–5.11)
RDW: 18.7 % — ABNORMAL HIGH (ref 11.5–15.5)
WBC: 14.4 10*3/uL — ABNORMAL HIGH (ref 4.0–10.5)
nRBC: 0 % (ref 0.0–0.2)

## 2018-11-04 LAB — GLUCOSE, CAPILLARY
Glucose-Capillary: 168 mg/dL — ABNORMAL HIGH (ref 70–99)
Glucose-Capillary: 142 mg/dL — ABNORMAL HIGH (ref 70–99)
Glucose-Capillary: 154 mg/dL — ABNORMAL HIGH (ref 70–99)

## 2018-11-04 LAB — BASIC METABOLIC PANEL
Anion gap: 8 (ref 5–15)
BUN: 43 mg/dL — ABNORMAL HIGH (ref 6–20)
CO2: 28 mmol/L (ref 22–32)
Calcium: 7.1 mg/dL — ABNORMAL LOW (ref 8.9–10.3)
Chloride: 111 mmol/L (ref 98–111)
Creatinine, Ser: 0.65 mg/dL (ref 0.44–1.00)
GFR calc Af Amer: >60 mL/min (ref >60)
GFR calc non Af Amer: >60 mL/min (ref >60)
Glucose, Bld: 174 mg/dL — ABNORMAL HIGH (ref 70–99)
Potassium: 3.4 mmol/L — ABNORMAL LOW (ref 3.5–5.1)
Sodium: 147 mmol/L — ABNORMAL HIGH (ref 135–145)

## 2018-11-04 LAB — PHOSPHORUS: Phosphorus: 2.4 mg/dL — ABNORMAL LOW (ref 2.5–4.6)

## 2018-11-04 LAB — MAGNESIUM: Magnesium: 2 mg/dL (ref 1.7–2.4)

## 2018-11-04 MED ORDER — ONDANSETRON HCL 4 MG/2ML IJ SOLN
4.0000 mg | Freq: Three times a day (TID) | INTRAMUSCULAR | Status: DC | PRN
  Administered 2018-11-04: 04:00:00 4 mg via INTRAVENOUS
  Filled 2018-11-04: qty 2

## 2018-11-04 MED ORDER — POTASSIUM PHOSPHATES 15 MMOLE/5ML IV SOLN
20.0000 mmol | Freq: Once | INTRAVENOUS | Status: DC
  Administered 2018-11-04: 20 mmol via INTRAVENOUS
  Filled 2018-11-04: qty 6.67

## 2018-11-04 MED ORDER — BISACODYL 10 MG RE SUPP: 10.0000 mg | Freq: Every day | RECTAL | Status: DC | PRN

## 2018-11-04 MED ORDER — POLYVINYL ALCOHOL 1.4 % OP SOLN
2.0000 [drp] | OPHTHALMIC | Status: DC | PRN
  Filled 2018-11-04: qty 15

## 2018-11-04 MED ORDER — MORPHINE 100MG IN NS 100ML (1MG/ML) PREMIX INFUSION
2.0000 mg/h | INTRAVENOUS | Status: DC
  Administered 2018-11-04: 13:00:00 5 mg/h via INTRAVENOUS
  Filled 2018-11-04: qty 100

## 2018-11-04 MED ORDER — SCOPOLAMINE 1 MG/3DAYS TD PT72
1.0000 | MEDICATED_PATCH | TRANSDERMAL | Status: DC
  Filled 2018-11-04: qty 1

## 2018-11-04 MED ORDER — ATROPINE SULFATE 1 % OP SOLN
2.0000 [drp] | Freq: Four times a day (QID) | OPHTHALMIC | Status: DC | PRN
  Filled 2018-11-04: qty 2

## 2018-11-08 LAB — PNEUMOCYSTIS JIROVECI SMEAR BY DFA: Pneumocystis jiroveci Ag: NEGATIVE

## 2018-11-11 ENCOUNTER — Telehealth: Payer: Self-pay | Admitting: *Deleted

## 2018-11-11 NOTE — Telephone Encounter (Signed)
Received original D/C from [facility]-D/C forwarded to Dr.Ramaswamy(Pulmonary) for signature.

## 2018-11-15 NOTE — Telephone Encounter (Signed)
Recived original D/C signed, D/C forwarded to [facility] as requested.

## 2018-11-23 NOTE — Progress Notes (Signed)
Pt's cardiac TOD 1330. Confirmed with Solmon Ice, RN. Provided emotional support for patient and family.

## 2018-11-23 NOTE — Progress Notes (Signed)
Palliative:   Conference with nursing staff and CC PA.  Sarah [name] is able to nod yes and no appropriately to both myself and nursing staff.  She indicates that she wants to be liberate from vent support.  I ask if she understand that time may be short, and she nods "yes".  I share that we will continue to take care of her regardless.    Call to sister Sarah [name].  Left VM requesting return call and suggesting that Sarah [name] would like to be extubated, and family who wants to should be present.   Extended family meeting with father and sisters at bedside.  All agree that Sarah [name] has let them know that she is ready to be unburdened from life support.  They are tearful, but accepting. Orders made.  Conference with nursing staff and Rt related to compassionate extubation, comfort care regimen.   Plan:   Family meeting today around noon, hopefully for compassionate extubation.  Family elects compassionate extubation. They ask that Sarah [name]'s suffering not be prolonged.   Prognosis:  Hours, anticipate hospital death.   140  minutes, extended time Sarah Axe, NP Palliative medicine team Team Phone # [phone] Greater than 50% of this time was spent counseling and coordinating care related to the above assessment and plan.

## 2018-11-23 NOTE — Accreditation Note (Signed)
o Restraints not reported to CMS Pursuant to regulation 482.13 (G) (3) use of soft wrist restraints was logged

## 2018-11-23 NOTE — Discharge Summary (Signed)
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 patient with    has a past medical history of Anxiety, Cancer ([place]), Chronic back pain, Complication of anesthesia, DDD (degenerative disc disease), cervical, Depression, DJD (degenerative joint disease), Family history of adverse reaction to anesthesia, GERD (gastroesophageal reflux disease), History of blood transfusion, History of rectal fissure, HIT (heparin-induced thrombocytopenia) ([place]), Hypertension, Hypothyroidism, Obesity, Paraplegia at T4 level [facility]), Pneumonia (03/2018), Pulmonary embolus, right ([place]) (2015), and Rotator cuff tendonitis (right).   has a past surgical history that includes Tonsillectomy; Laminectomy (N/A, 12/14/2013); Posterior lumbar fusion 4 level (N/A, 12/30/2013); Thyroidectomy (N/A, 01/09/2014); Laminectomy (N/A, 07/05/2015); Femur IM nail (Left, 09/11/2017); Laminectomy (N/A, 09/03/2017); Cholecystectomy (N/A, 01/25/2018); Craniotomy (N/A, 2/72/5366); and Application of cranial navigation (N/A, 06/01/2018).   Admitted on 10/29/2018 with   She was initially admitted to [facility] 8/3 from [facility] for increased work of breathing,  diagnosed with pneumonia.  Was also found to have a catheter associated UTI, with colonies growing morganella and Ecoli.  She was on vancomycin and zosyn, initially, then transitioned to meropenem 8/6 due to better susceptibilities.  During admission, she was also started on treatment for esophageal candidiasis with fluconazole.  She has difficulty clearing secretions, and found to be high risk for aspiration a modified barium swallow 8/7, and made NPO.    This evening, patient had increasing oxygen requirements and increased work of breathing while on the floor.  She was on 3L up until yesterday, when she was increased to 6L all day today, then further increased to 15L this evening.  Found to have worsening left sided mucus plugging vs consolidation.    On arrival to [facility] ICU, patient is lethargic but oriented and responsive to voice, is having phenylephrine running through peripheral IV at 30, and is satting 99% on 15L O2.     Noted to have stage 4 sacral decub    Significant Hospital Events   11/18/2018 - admit. 8/03 Urine culture>> Morganella and E coli>> Morganella sensitive to ceftriaxone, imipenem, zosyn, E. Coli sensitive only to Imipenam   8/4 - needing 4L [place]  8/5 - stable o 3L [place] but with ongoing paraplegia related intermittent hypotension. Palliative care consult   8/7 - ICU transfer  10/30/2018 intubation>>  11/01/2018 - failed SBT   11/02/2018 - plat 45K and pharmacy wants to hold lovenox. HITT panel pending. Low mag, k and phos., Desaturated on 40% fio2 on vent. Needing 60% . On precedex. Tachypneic even on full support  8/12 Off pressors. Fails SBT.    Palliative care discussions held since 10/27/2018. Patient desired comfort oriented care. She was extubated at her  request on 12-03-18 and she expired subsequently    SIGNED Dr. Brand Males, M.D., [facility].C.P Pulmonary and Critical Care Medicine Staff Physician [place] [facility] Pager: [phone], If no answer or between  15:00h - 7:00h: call [phone]  11/11/2018 9:45 AM

## 2018-11-23 NOTE — Progress Notes (Signed)
Per RN, pt was having desats and increased RR.  Pt was placed back on full vent support per RN.

## 2018-11-23 NOTE — Progress Notes (Addendum)
NAME:  Sarah [name], MRN:  [id number], DOB:  [birth year], LOS: 22 ADMISSION DATE:  11/07/2018, CONSULTATION DATE:  10/30/2018 REFERRING MD: Jani Gravel , CHIEF COMPLAINT:  Acute respiratory distress   Brief History   53 y.o. F with a history of Hurthle cell Ca and baseline paraplegia who was initially admitted to [facility] 8/3 from [facility] for increased work of breathing, diagnosed with pneumonia.  Was also found to have a catheter associated UTI, with colonies growing morganella and Ecoli.  She was on vancomycin and zosyn, initially, then transitioned to meropenem 8/6 due to better susceptibilities.  During admission, she was also started on treatment for esophageal candidiasis with fluconazole.  She has difficulty clearing secretions, and found to be high risk for aspiration a modified barium swallow 8/7, and made NPO.    This evening, patient had increasing oxygen requirements and increased work of breathing while on the floor.  She was on 3L up until yesterday, when she was increased to 6L all day today, then further increased to 15L this evening.  Found to have worsening left sided mucus plugging vs consolidation.  Pt was then intubated and sedated.  Palliative care consulted for goals of care discussion.  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  Had craniectomy 3/202 History of PE, on xarelto Sacral wounds, s/p debridement of sacral ulcer 08/10/2018, CT negative for osteomyelitis   Significant Hospital Events   11/06/2018 - admit 11/01/2018 - failed SBT 8/12 Off pressors  Consults:  Palliative care  Procedures:  10/30/2018 intubation>>  Significant Diagnostic Tests:  8/11 LE duplex>>neg for DVT  Micro Data:  8/03 Sars-CoV-2>>negative 8/03 Urine culture>> Morganella and E coli>> Morganella sensitive to ceftriaxone, imipenem, zosyn, E. Coli sensitive only to Imipenam 10/30/2018 BAL showing rare yeast>> 10/30/2018  blood cultures x2>>negative  Antimicrobials:  Zosyn >>8/3-8/5 Meropenem>> 8/6-8/13 Fluconazole>> 8/7-8/10 Vancomycin>>8/3-8/8   SUBJECTIVE/OVERNIGHT/INTERVAL HX  Failed SBT this morning, Pt is awake and appears aware of her surroundings.    Objective   Blood pressure (!) 163/95, pulse 84, temperature 99.5 F (37.5 C), resp. rate (!) 24, height _0  (1.803 m), weight 89.1 kg, last menstrual period 10/22/2016, SpO2 94 %.    Vent Mode: PRVC FiO2 (%):  [40 %-50 %] 50 % Set Rate:  [14 bmp] 14 bmp Vt Set:  [560 mL-570 mL] 560 mL PEEP:  [5 cmH20] 5 cmH20 Pressure Support:  [15 cmH20] 15 cmH20 Plateau Pressure:  [27 cmH20-33 cmH20] 31 cmH20   Intake/Output Summary (Last 24 hours) at 11/28/18 6378 Last data filed at 11/28/18 5885 Gross per 24 hour  Intake 3126.2 ml  Output 2925 ml  Net 201.2 ml   Filed Weights   11/02/18 0500 11/03/18 0500 11/28/2018 0500  Weight: 89.7 kg 89 kg 89.1 kg    General:  Elderly, frail, chronically ill F in no distress HEENT: MM pink/moist Neuro: arousable and following commands CV: s1s2, regular rate and rhythm, no m/r/g PULM:  Course breath sounds throughout GI: soft, bsx4 active  Extremities: warm/dry, 1+ edema  Skin: no rashes or lesions    Resolved Hospital Problem list     Assessment & Plan:  53 yo F with a history of paraplegia due to metastatic Hurthle cell cancer, sacral wounds, chronic hypotension, chronic indwelling foley catheter, who transferred from [facility] for worsening hypoxic respiratory failure due top left sided mucus plugging vs LLL consoildation, and worsening hypotension.  Septic Shock secondary to E. Coli and Morganella UTI -shock physiology improved, off pressors -Both E. Coli and Morganella sensitive to Meropenem  -Fever curve improving and leukocytosis slightly improved to 16K -Completed stress dose steroids P: -Completed 8 days Meropenem, d/c today -BP stable, monitor off steroids and pressors     Acute Hypoxic Respiratory Failure -secondary to mucous plugging and aspiration pneumonitis and difficulty weaning likely secondary to overall weakness and decondition and T3 metastasis -s/p Bronch and BAL 8/8 -Failing SBT, overall picture of chronic illness secondary to Hurthle cell cancer and paraplegia with inability to wean from Vent.  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 outpatient, s/p T3 laminectomy and total thyroidectomy -Last note 06/2018 with mention of repeat PET scan before consideration of resuming Linvatinib -Message left for [name] regarding goals of care discussion today and whether further treatment from an oncologic standpoint would be advised  Esophageal Candidiasis  -completed 4 days Diflucan and then d/c'd secondary to prolonged Qtc    Long Qtc -Continue to monitor and avoid prolonging medications   History of PE -On Xarelto at home, held due to anemia and thrombocytopenis P: -SCD's for DVT prophylaxis, resume Xarelto as able -LE duplex negative for DVT    Thrombocytopenia and Anemia -Hgb stable P: -Follow CBC  -Hold Lovenox -Transfuse as needed        Best practice:  Diet: Tube feeding Pain/Anxiety/Delirium protocol (if indicated): Sedation protocol for tube collar VAP protocol (if indicated): Yes DVT prophylaxis:SCD GI prophylaxis: PPI Glucose control: SSI Mobility: Currently on bedrest Code Status: Full code Family Communication: bed-side meeting Disposition: ICU     Labs  For reference    LABS    PULMONARY Recent Labs  Lab 10/31/18 0502  10/31/18 1044 11/01/18 0336 11/01/18 0728 11/02/18 0359  PHART 7.581* 7.487* 7.512* 7.494* 7.553*  PCO2ART 24.1* 33.6 32.8 34.7 37.3  PO2ART 79.0* 95.0 64.0* 56.0* 58.0*  HCO3 22.6 25.4 26.4 26.7 32.9*  TCO2 _0 34*  O2SAT 98.0 98.0 95.0 91.0 93.0    CBC Recent Labs  Lab 11/02/18 0238 11/02/18 0359 11/03/18 0430 2018-11-12 0456  HGB 8.9* 8.8* 8.2* 7.7*  HCT 27.1* 26.0* 25.0* 24.8*  WBC 17.8*  --  16.1* 14.4*  PLT 45*  --  35* 32*    COAGULATION Recent Labs  Lab 10/30/18 0143  INR 2.5*    CARDIAC  No results for input(s): TROPONINI in the last 168 hours. No results for input(s): PROBNP in the last 168 hours.   CHEMISTRY Recent Labs  Lab 10/31/18 0253  11/01/18 0325  11/02/18 0238 11/02/18 0359 11/03/18 0040 11/03/18 0430 11/12/18 0456  NA 146*   < > 148*   < > 151* 152* 149* 148* 147*  K 4.0   < > 3.7   < > 3.2* 3.0* 3.7 3.4* 3.4*  CL 105  --  112*  --  109  --  111 112* 111  CO2 27  --  26  --  30  --  _1 GLUCOSE 336*  --  249*  --  184*  --  183* 161* 174*  BUN 32*  --  40*  --  51*  --  55* 52* 43*  CREATININE 1.29*  --  0.85  --  0.78  --  0.74 0.72 0.65  CALCIUM 6.5*  --  7.3*  --  6.6*  --  7.0* 7.0* 7.1*  MG 2.1  --  2.2  --  1.8  --   --  2.1 2.0  PHOS <1.0*  --  1.0*  --  1.9*  --   --  1.9* 2.4*   < > = values in this interval not displayed.   Estimated Creatinine Clearance: 101.4 mL/min (by C-G formula based on SCr of 0.65 mg/dL).   LIVER Recent Labs  Lab 10/29/18 2141 10/30/18 0143  AST 25 29  ALT 20 22  ALKPHOS 101 114  BILITOT 1.3* 1.7*  PROT 5.5* 5.3*  ALBUMIN 1.8* 1.6*  INR  --  2.5*     INFECTIOUS Recent Labs  Lab 10/29/18 2142 10/30/18 0142 10/30/18 0557  LATICACIDVEN 1.0 1.2 1.5     ENDOCRINE CBG (last 3)  Recent Labs    11/03/18 2328 2018-11-30 0331 November 30, 2018 0756  GLUCAP 143* 168* 142*         IMAGING x48h  - image(s) personally visualized  -   highlighted in bold Dg Chest Port 1 View   Result Date: 11/03/2018 CLINICAL DATA:  Hypoxia EXAM: PORTABLE CHEST 1 VIEW COMPARISON:  November 02, 2018 FINDINGS: Endotracheal tube tip is 3.0 cm above the carina. Nasogastric tube tip and side port are below the diaphragm. Central catheter tip is in the right innominate vein, essentially stable. No pneumothorax. There is patchy airspace opacity bilaterally, more severe on the left than on the right, likely multifocal pneumonia. There may be a degree of superimposed alveolar edema. There is a small left pleural effusion. Heart is upper normal in size with pulmonary vascularity normal. No adenopathy. Postoperative change noted in the upper thoracic region. IMPRESSION: Tube and catheter positions as described without pneumothorax. Patchy airspace opacity bilaterally, more on the left than on the right, likely multifocal pneumonia. Appearance similar to 1 day prior. Stable cardiac silhouette. Electronically Signed   By: Lowella Grip III M.D.   On: 11/03/2018 08:05   Vas [place] Lower Extremity Venous (dvt)  Result Date: 11/02/2018  Lower Venous Study Indications: Edema.  Comparison Study: 05/06/2017 left negative. 05/13/2017 right negative Performing Technologist: Toma Copier RVS  Examination Guidelines: A complete evaluation includes B-mode imaging, spectral Doppler, color Doppler, and power Doppler as needed of all accessible portions of each vessel. Bilateral testing is considered an integral part of a complete examination. Limited examinations for reoccurring indications may be performed as noted.  +---------+---------------+---------+-----------+----------+-------+ RIGHT    CompressibilityPhasicitySpontaneityPropertiesSummary +---------+---------------+---------+-----------+----------+-------+ CFV      Full           Yes      Yes                          +---------+---------------+---------+-----------+----------+-------+ SFJ      Full                                                  +---------+---------------+---------+-----------+----------+-------+ FV Prox  Full           Yes      Yes                          +---------+---------------+---------+-----------+----------+-------+  FV Mid   Full                                                 +---------+---------------+---------+-----------+----------+-------+ FV DistalFull           Yes      Yes                          +---------+---------------+---------+-----------+----------+-------+ PFV      Full           Yes      Yes                          +---------+---------------+---------+-----------+----------+-------+ POP      Full           Yes      Yes                          +---------+---------------+---------+-----------+----------+-------+ PTV      Full                                                 +---------+---------------+---------+-----------+----------+-------+ PERO     Full                                                 +---------+---------------+---------+-----------+----------+-------+   +---------+---------------+---------+-----------+----------+-------+ LEFT     CompressibilityPhasicitySpontaneityPropertiesSummary +---------+---------------+---------+-----------+----------+-------+ CFV      Full           Yes      Yes                          +---------+---------------+---------+-----------+----------+-------+ SFJ      Full                                                 +---------+---------------+---------+-----------+----------+-------+ FV Prox  Full           Yes      Yes                          +---------+---------------+---------+-----------+----------+-------+ FV Mid   Full                                                 +---------+---------------+---------+-----------+----------+-------+ FV DistalFull           Yes      Yes                          +---------+---------------+---------+-----------+----------+-------+ PFV       Full           Yes      Yes                          +---------+---------------+---------+-----------+----------+-------+  POP      Full           Yes      Yes                          +---------+---------------+---------+-----------+----------+-------+ PTV      Full                                                 +---------+---------------+---------+-----------+----------+-------+ PERO     Full                                                 +---------+---------------+---------+-----------+----------+-------+     Summary: Right: There is no evidence of deep vein thrombosis in the lower extremity. No cystic structure found in the popliteal fossa. Left: There is no evidence of deep vein thrombosis in the lower extremity. No cystic structure found in the popliteal fossa.  *See table(s) above for measurements and observations. Electronically signed by Harold Barban MD on 11/02/2018 at 8:55:03 PM.    Final    Critical Care time 45 minutes   Otilio Carpen Lutie Pickler, PA-C [facility] Pager# [phone]

## 2018-11-23 NOTE — Progress Notes (Signed)
Visited with patient and family at bedside. Patient was extubated.  Per family request prayer was made with all.  Provided spiritual and emotional support. Supported Biochemist, clinical as well. Will follow as needed.  Jaclynn Major, [place], [facility], Pager [phone]

## 2018-11-23 NOTE — Procedures (Signed)
Extubation Procedure Note  Patient Details:   Name: Sarah [name] DOB: Aug 20, 1965 MRN: [id number]   Airway Documentation:    Vent end date: November 29, 2018 Vent end time: 1300   Evaluation  O2 sats: stable throughout Complications: No apparent complications Patient did tolerate procedure well. Bilateral Breath Sounds: Rhonchi   No pt not able to speak, pt was extubated per withdrawal of life support.  All questions answered prior to extubation.    [name] November 29, 2018, 1:07 PM

## 2018-11-23 NOTE — Progress Notes (Signed)
Wasted 52ml morphine gtt with Solmon Ice, RN.

## 2018-11-23 DEATH — deceased

## 2020-03-25 IMAGING — CT CT ABD-PELV W/O CM
2 of 4 series · 16 of 46 positions shown, 18 images · non-contrast
Comparison: PET-CT 05/20/2017 and previous

CLINICAL DATA: Per ED notes: Pt presents today for nausea x 2 days.
Took zofran at home with no relief. Pt denies diarrhea. Pt take oral
chemo for thyroid CA with mets. Pt denies being around anyone with
similar symptoms.

EXAM:
CT ABDOMEN AND PELVIS WITHOUT CONTRAST
TECHNIQUE: Multidetector CT imaging of the abdomen and pelvis was performed
following the standard protocol without IV contrast.

[Series 2: axial st · axial · 0.81mm/px · z∈[+698,+1168]mm · 13 of 102 slices shown, 15 images]
[im 4/102  soft-tissue]
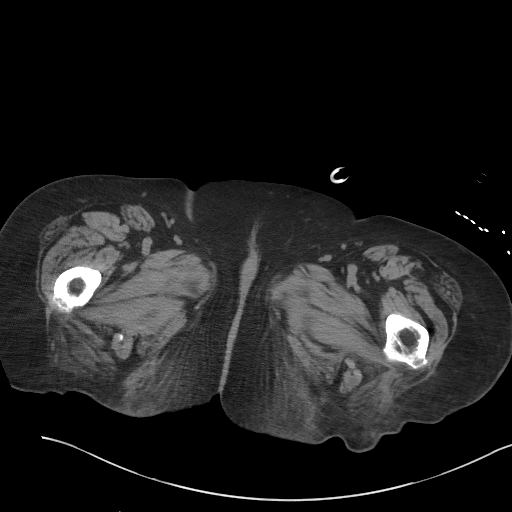
[im 4/102  bone]
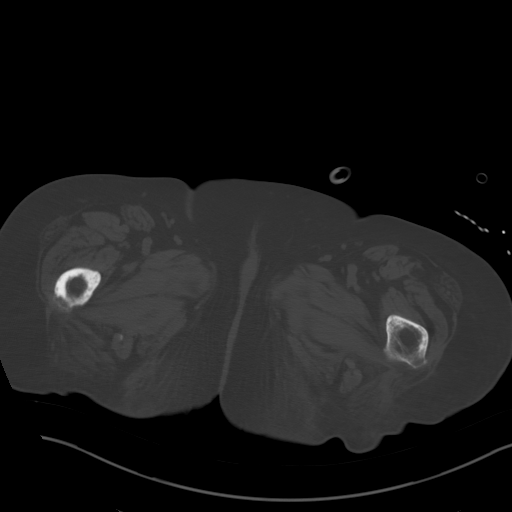
[im 12/102  soft-tissue]
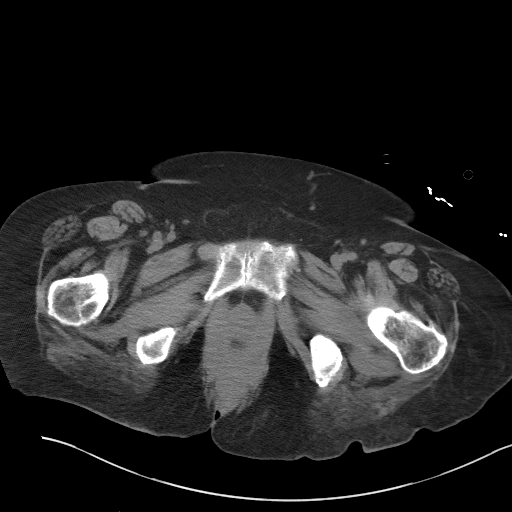
[im 20/102  soft-tissue]
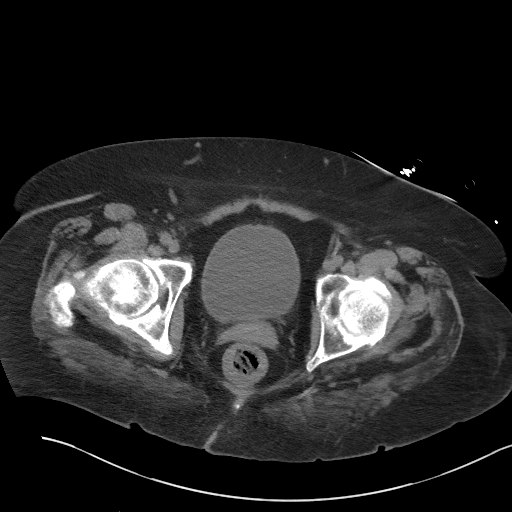
[im 28/102  soft-tissue]
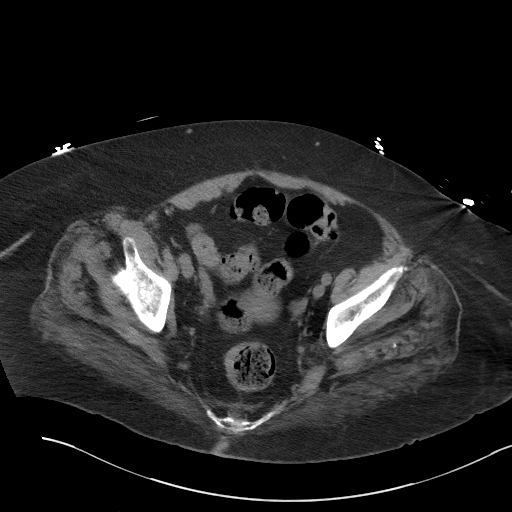
[im 35/102  soft-tissue]
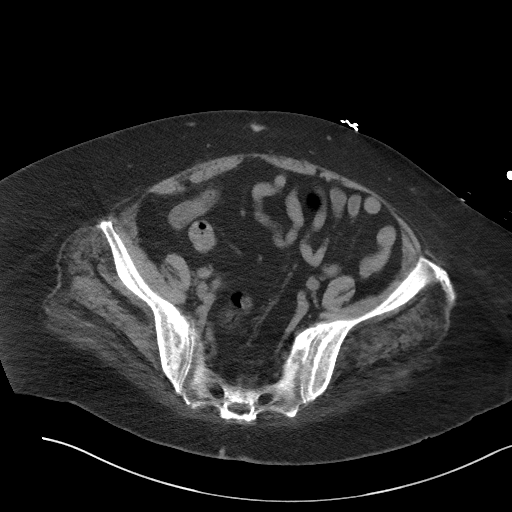
[im 43/102  soft-tissue]
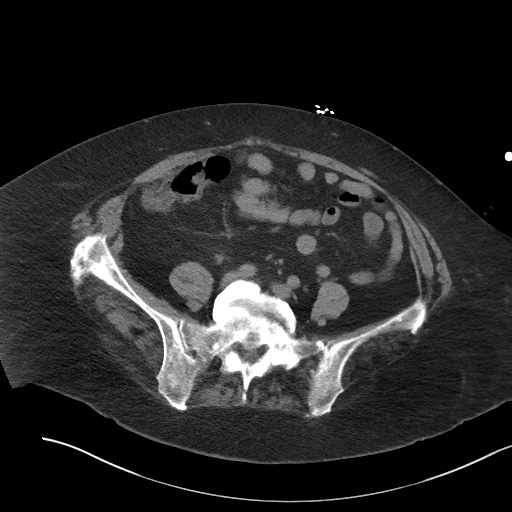
[im 51/102  soft-tissue]
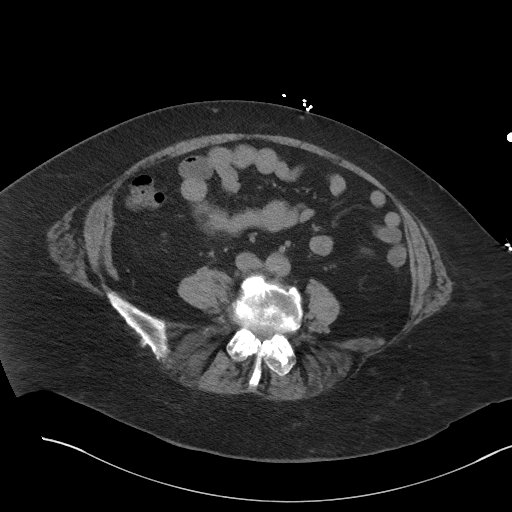
[im 59/102  soft-tissue]
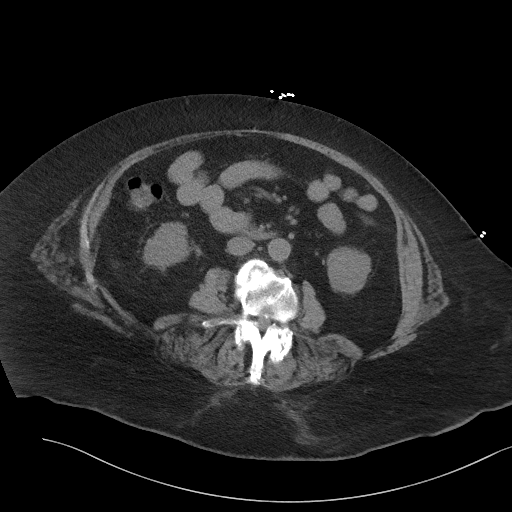
[im 67/102  soft-tissue]
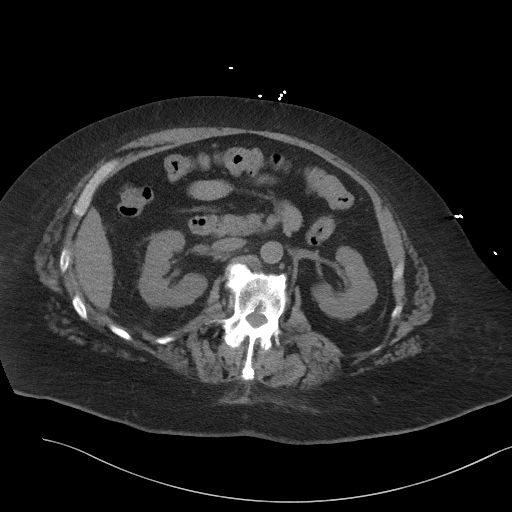
[im 67/102  bone]
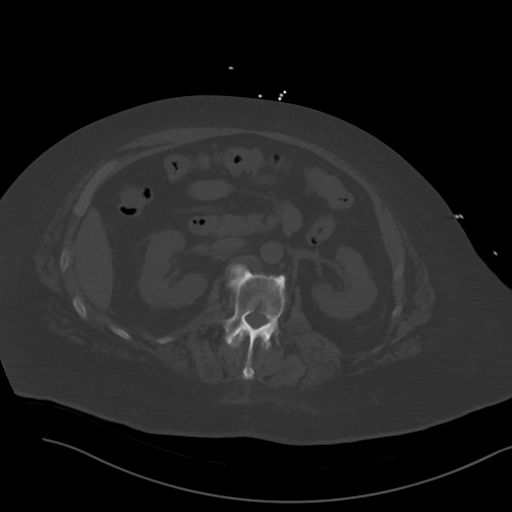
[im 74/102  soft-tissue]
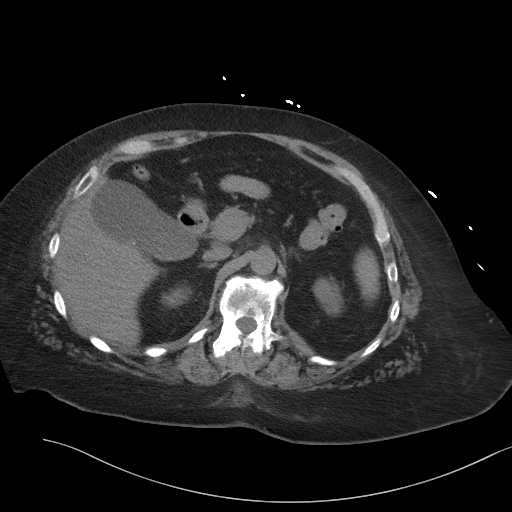
[im 82/102  soft-tissue]
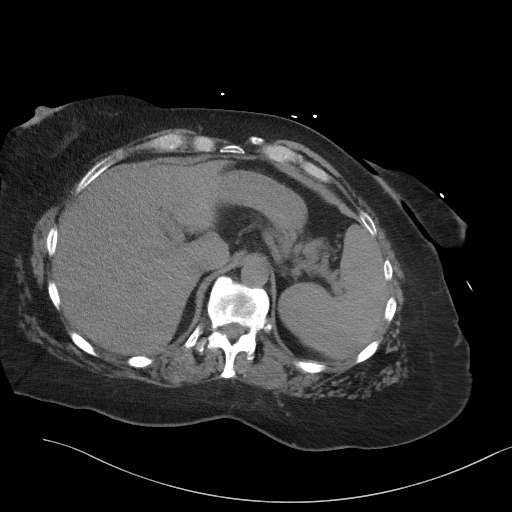
[im 90/102  soft-tissue]
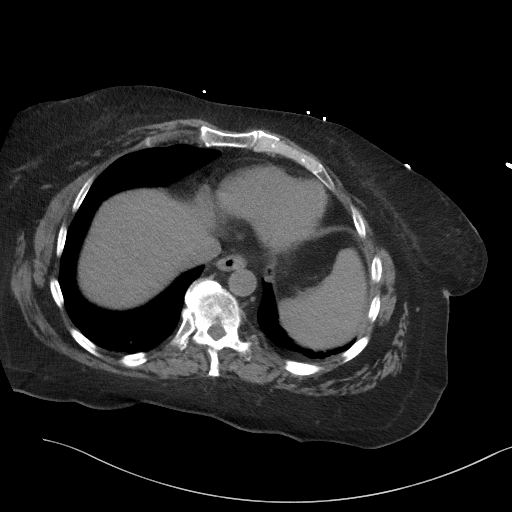
[im 98/102  soft-tissue]
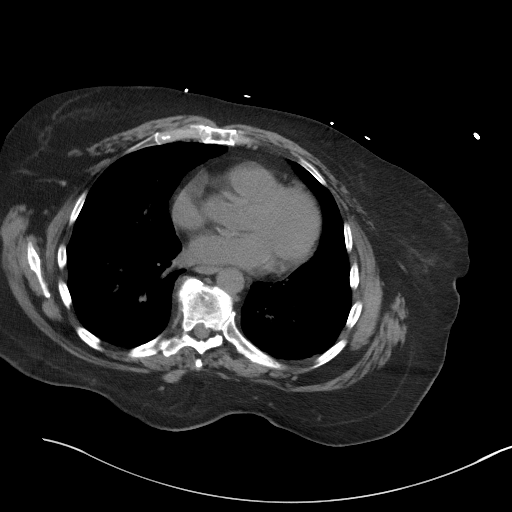

[Series 5: coronal st · coronal · 0.82mm/px · 3 of 97 slices shown]
[im 33/97  soft-tissue]
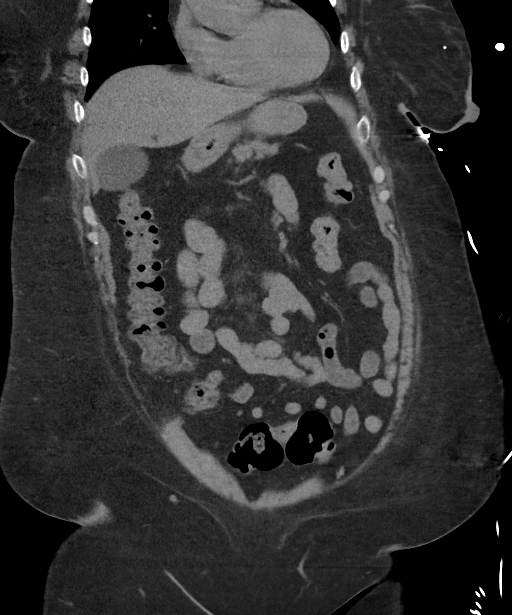
[im 43/97  soft-tissue]
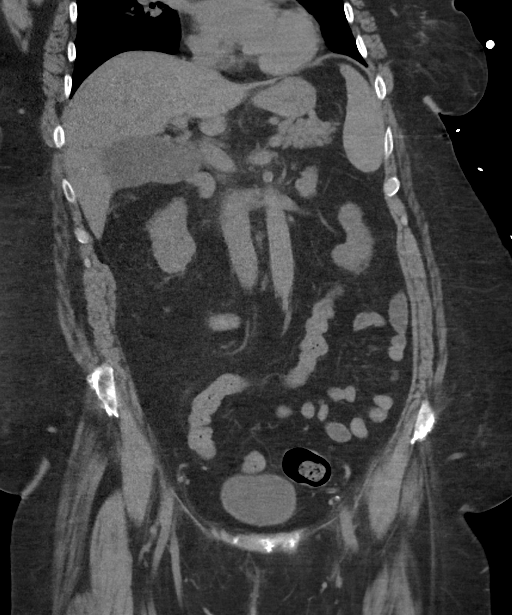
[im 54/97  soft-tissue]
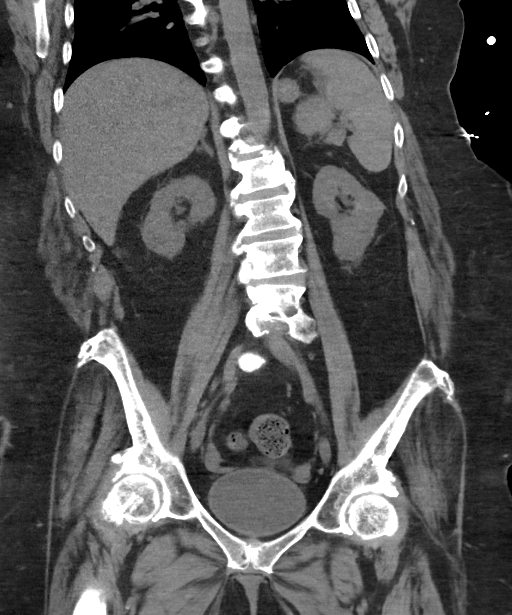

[16 of 46 positions shown; findings below may reference images not displayed]

FINDINGS: Lower chest: Lower lobe pulmonary nodules up to 5 mm, stable since
previous. No pleural or pericardial effusion.

Hepatobiliary: Gallbladder is distended with small partially
calcified stones in its dependent aspect. Unremarkable liver.

Pancreas: Unremarkable. No pancreatic ductal dilatation or
surrounding inflammatory changes.

Spleen: Normal in size without focal abnormality.

Adrenals/Urinary Tract: Adrenal glands are unremarkable. Kidneys are
normal, without renal calculi, focal lesion, or hydronephrosis.
Bladder is unremarkable.

Stomach/Bowel: Stomach is nondistended. Small bowel is nondilated.
Colon is nondilated with a few scattered diverticula.

Vascular/Lymphatic: No significant vascular findings are present. No
enlarged abdominal or pelvic lymph nodes.

Reproductive: Uterus and bilateral adnexa are unremarkable.

Other: No ascites.  No free air.

Musculoskeletal: Spondylitic changes throughout the lumbar spine.
Bilateral hip degenerative spurring. No fracture or worrisome bone
lesion.
IMPRESSION: 1. No acute findings.
2. Stable small pulmonary nodules at the visualized lung bases.
